# Patient Record
Sex: Male | Born: 1995
Health system: Southern US, Community
[De-identification: ages and names within clinical notes are randomized; demographics above are authoritative.]

## PROBLEM LIST (undated history)

## (undated) DIAGNOSIS — I639 Cerebral infarction, unspecified: Secondary | ICD-10-CM

## (undated) DIAGNOSIS — I10 Essential (primary) hypertension: Secondary | ICD-10-CM

## (undated) DIAGNOSIS — E119 Type 2 diabetes mellitus without complications: Secondary | ICD-10-CM

## (undated) HISTORY — PX: TRACHEOSTOMY: SUR1362

## (undated) HISTORY — PX: PEG TUBE PLACEMENT: SUR1034

## (undated) HISTORY — PX: IVC FILTER PLACEMENT (ARMC HX): HXRAD1551

## (undated) HISTORY — PX: LEG SURGERY: SHX1003

---
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
## 2015-07-03 ENCOUNTER — Encounter (HOSPITAL_COMMUNITY): Payer: Self-pay | Admitting: *Deleted

## 2015-07-03 DIAGNOSIS — R1111 Vomiting without nausea: Secondary | ICD-10-CM | POA: Diagnosis not present

## 2015-07-03 DIAGNOSIS — R197 Diarrhea, unspecified: Secondary | ICD-10-CM | POA: Insufficient documentation

## 2015-07-03 DIAGNOSIS — R509 Fever, unspecified: Secondary | ICD-10-CM | POA: Diagnosis present

## 2015-07-03 MED ORDER — ACETAMINOPHEN 500 MG PO TABS
1000.0000 mg | ORAL_TABLET | Freq: Once | ORAL | Status: AC
  Administered 2015-07-04: 1000 mg via ORAL

## 2015-07-03 MED ORDER — ACETAMINOPHEN 500 MG PO TABS
ORAL_TABLET | ORAL | Status: DC
Start: 2015-07-03 — End: 2015-07-04
  Filled 2015-07-03: qty 2

## 2015-07-03 NOTE — ED Notes (Signed)
Dad states fever for the past 3 days. Pt states been vomiting & diarrhea.

## 2015-07-04 ENCOUNTER — Emergency Department (HOSPITAL_COMMUNITY)
Admission: EM | Admit: 2015-07-04 | Discharge: 2015-07-04 | Disposition: A | Discharge status: Home / Self Care | Payer: BLUE CROSS/BLUE SHIELD | Attending: Emergency Medicine | Admitting: Emergency Medicine

## 2015-07-04 DIAGNOSIS — R112 Nausea with vomiting, unspecified: Secondary | ICD-10-CM

## 2015-07-04 DIAGNOSIS — R197 Diarrhea, unspecified: Secondary | ICD-10-CM

## 2015-07-04 LAB — COMPREHENSIVE METABOLIC PANEL
ALBUMIN: 3.5 g/dL (ref 3.5–5.0)
ALK PHOS: 54 U/L (ref 38–126)
ALT: 31 U/L (ref 17–63)
AST: 25 U/L (ref 15–41)
Anion gap: 11 (ref 5–15)
BILIRUBIN TOTAL: 1.3 mg/dL — AB (ref 0.3–1.2)
BUN: 12 mg/dL (ref 6–20)
CALCIUM: 8.7 mg/dL — AB (ref 8.9–10.3)
CO2: 21 mmol/L — AB (ref 22–32)
Chloride: 102 mmol/L (ref 101–111)
Creatinine, Ser: 1.31 mg/dL — ABNORMAL HIGH (ref 0.61–1.24)
GFR calc Af Amer: >60 mL/min (ref >60)
GFR calc non Af Amer: >60 mL/min (ref >60)
GLUCOSE: 116 mg/dL — AB (ref 65–99)
Potassium: 3.6 mmol/L (ref 3.5–5.1)
Sodium: 134 mmol/L — ABNORMAL LOW (ref 135–145)
TOTAL PROTEIN: 8.5 g/dL — AB (ref 6.5–8.1)

## 2015-07-04 LAB — URINE MICROSCOPIC-ADD ON

## 2015-07-04 LAB — CBC WITH DIFFERENTIAL/PLATELET
BASOS ABS: 0 10*3/uL (ref 0.0–0.1)
BASOS PCT: 0 %
Eosinophils Absolute: 0 10*3/uL (ref 0.0–0.7)
Eosinophils Relative: 0 %
HEMATOCRIT: 40.3 % (ref 39.0–52.0)
HEMOGLOBIN: 13.3 g/dL (ref 13.0–17.0)
Lymphocytes Relative: 17 %
Lymphs Abs: 2 10*3/uL (ref 0.7–4.0)
MCH: 27.7 pg (ref 26.0–34.0)
MCHC: 33 g/dL (ref 30.0–36.0)
MCV: 84 fL (ref 78.0–100.0)
MONOS PCT: 10 %
Monocytes Absolute: 1.2 10*3/uL — ABNORMAL HIGH (ref 0.1–1.0)
NEUTROS ABS: 8.5 10*3/uL — AB (ref 1.7–7.7)
NEUTROS PCT: 73 %
Platelets: 244 10*3/uL (ref 150–400)
RBC: 4.8 MIL/uL (ref 4.22–5.81)
RDW: 13.8 % (ref 11.5–15.5)
WBC: 11.7 10*3/uL — AB (ref 4.0–10.5)

## 2015-07-04 LAB — URINALYSIS, ROUTINE W REFLEX MICROSCOPIC
GLUCOSE, UA: NEGATIVE mg/dL
Hgb urine dipstick: NEGATIVE
Ketones, ur: 15 mg/dL — AB
Nitrite: NEGATIVE
PH: 6 (ref 5.0–8.0)
PROTEIN: 100 mg/dL — AB
SPECIFIC GRAVITY, URINE: 1 — AB (ref 1.005–1.030)

## 2015-07-04 LAB — LIPASE, BLOOD: Lipase: 20 U/L (ref 11–51)

## 2015-07-04 MED ORDER — SODIUM CHLORIDE 0.9 % IV BOLUS (SEPSIS)
1000.0000 mL | Freq: Once | INTRAVENOUS | Status: AC
  Administered 2015-07-04: 1000 mL via INTRAVENOUS

## 2015-07-04 MED ORDER — LOPERAMIDE HCL 2 MG PO CAPS: 2.0000 mg | ORAL_CAPSULE | Freq: Four times a day (QID) | ORAL | Status: DC | PRN

## 2015-07-04 MED ORDER — ONDANSETRON 4 MG PO TBDP: 4.0000 mg | ORAL_TABLET | Freq: Three times a day (TID) | ORAL | Status: DC | PRN

## 2015-07-04 MED ORDER — KETOROLAC TROMETHAMINE 30 MG/ML IJ SOLN
30.0000 mg | Freq: Once | INTRAMUSCULAR | Status: AC
  Administered 2015-07-04: 30 mg via INTRAVENOUS
  Filled 2015-07-04: qty 1

## 2015-07-04 MED ORDER — DICYCLOMINE HCL 10 MG/ML IM SOLN
20.0000 mg | Freq: Once | INTRAMUSCULAR | Status: AC
  Administered 2015-07-04: 20 mg via INTRAMUSCULAR
  Filled 2015-07-04: qty 2

## 2015-07-04 MED ORDER — LOPERAMIDE HCL 2 MG PO CAPS
4.0000 mg | ORAL_CAPSULE | Freq: Once | ORAL | Status: AC
  Administered 2015-07-04: 4 mg via ORAL
  Filled 2015-07-04: qty 2

## 2015-07-04 MED ORDER — ONDANSETRON HCL 4 MG/2ML IJ SOLN
4.0000 mg | Freq: Once | INTRAMUSCULAR | Status: AC
  Administered 2015-07-04: 4 mg via INTRAVENOUS
  Filled 2015-07-04: qty 2

## 2015-07-04 NOTE — Discharge Instructions (Signed)
You may alternate between Tylenol 1000 mg every 6 hours as needed for fever and pain and ibuprofen 800 mg every 8 hours as needed for fever and pain. Please drink plenty of fluids.   Viral Gastroenteritis Viral gastroenteritis is also known as stomach flu. This condition affects the stomach and intestinal tract. It can cause sudden diarrhea and vomiting. The illness typically lasts 3 to 8 days. Most people develop an immune response that eventually gets rid of the virus. While this natural response develops, the virus can make you quite ill. CAUSES  Many different viruses can cause gastroenteritis, such as rotavirus or noroviruses. You can catch one of these viruses by consuming contaminated food or water. You may also catch a virus by sharing utensils or other personal items with an infected person or by touching a contaminated surface. SYMPTOMS  The most common symptoms are diarrhea and vomiting. These problems can cause a severe loss of body fluids (dehydration) and a body salt (electrolyte) imbalance. Other symptoms may include:  Fever.  Headache.  Fatigue.  Abdominal pain. DIAGNOSIS  Your caregiver can usually diagnose viral gastroenteritis based on your symptoms and a physical exam. A stool sample may also be taken to test for the presence of viruses or other infections. TREATMENT  This illness typically goes away on its own. Treatments are aimed at rehydration. The most serious cases of viral gastroenteritis involve vomiting so severely that you are not able to keep fluids down. In these cases, fluids must be given through an intravenous line (IV). HOME CARE INSTRUCTIONS   Drink enough fluids to keep your urine clear or pale yellow. Drink small amounts of fluids frequently and increase the amounts as tolerated.  Ask your caregiver for specific rehydration instructions.  Avoid:  Foods high in sugar.  Alcohol.  Carbonated drinks.  Tobacco.  Juice.  Caffeine  drinks.  Extremely hot or cold fluids.  Fatty, greasy foods.  Too much intake of anything at one time.  Dairy products until 24 to 48 hours after diarrhea stops.  You may consume probiotics. Probiotics are active cultures of beneficial bacteria. They may lessen the amount and number of diarrheal stools in adults. Probiotics can be found in yogurt with active cultures and in supplements.  Wash your hands well to avoid spreading the virus.  Only take over-the-counter or prescription medicines for pain, discomfort, or fever as directed by your caregiver. Do not give aspirin to children. Antidiarrheal medicines are not recommended.  Ask your caregiver if you should continue to take your regular prescribed and over-the-counter medicines.  Keep all follow-up appointments as directed by your caregiver. SEEK IMMEDIATE MEDICAL CARE IF:   You are unable to keep fluids down.  You do not urinate at least once every 6 to 8 hours.  You develop shortness of breath.  You notice blood in your stool or vomit. This may look like coffee grounds.  You have abdominal pain that increases or is concentrated in one small area (localized).  You have persistent vomiting or diarrhea.  You have a fever.  The patient is a child younger than 3 months, and he or she has a fever.  The patient is a child older than 3 months, and he or she has a fever and persistent symptoms.  The patient is a child older than 3 months, and he or she has a fever and symptoms suddenly get worse.  The patient is a baby, and he or she has no tears when crying. MAKE  SURE YOU:   Understand these instructions.  Will watch your condition.  Will get help right away if you are not doing well or get worse.   This information is not intended to replace advice given to you by your health care provider. Make sure you discuss any questions you have with your health care provider.   Document Released: 03/21/2005 Document Revised:  06/13/2011 Document Reviewed: 01/05/2011 Elsevier Interactive Patient Education [employer]2016 [employer]

## 2015-07-04 NOTE — ED Notes (Signed)
Patient tolerated po fluids without any difficulty.

## 2015-07-04 NOTE — ED Provider Notes (Signed)
TIME SEEN: 1:10 AM  CHIEF COMPLAINT: Fever, vomiting, diarrhea, abdominal pain  HPI: Pt is a 20 y.o. male who is morbidly obese who presents emergency department with 2-3 days of fevers, vomiting, diarrhea and diffuse crampy abdominal pain. Denies any recent sick contacts, bad food exposure, travel, hospitalization or antibiotic use. No history of abdominal surgery. No dysuria, hematuria, penile discharge, testicular pain or swelling. Last vomited yesterday morning. Had diarrhea prior to arrival. Reports 4-5 episodes today of nonbloody diarrhea.  ROS: See HPI Constitutional:  fever  Eyes: no drainage  ENT: no runny nose   Cardiovascular:  no chest pain  Resp: no SOB  GI:  vomiting GU: no dysuria Integumentary: no rash  Allergy: no hives  Musculoskeletal: no leg swelling  Neurological: no slurred speech ROS otherwise negative  PAST MEDICAL HISTORY/PAST SURGICAL HISTORY:  History reviewed. No pertinent past medical history.  MEDICATIONS:  Prior to Admission medications   Medication Sig Start Date End Date Taking? Authorizing Provider  acetaminophen-codeine (TYLENOL #3) 300-30 MG per tablet Take 1-2 tablets by mouth every 6 (six) hours as needed for pain. 11/26/12   Ivery QualeHobson Bryant, PA-C  aspirin-acetaminophen-caffeine (EXCEDRIN MIGRAINE) [phone] 0950250-250-65 MG per tablet Take 1 tablet by mouth daily as needed for pain.    Historical Provider, MD  ibuprofen (ADVIL,MOTRIN) 200 MG tablet Take 400 mg by mouth daily as needed for pain.    Historical Provider, MD  meloxicam (MOBIC) 7.5 MG tablet 1 po bid with food for pain 11/26/12   Ivery QualeHobson Bryant, PA-C  ondansetron (ZOFRAN) 4 MG tablet 1 po q6h prn nausea. 11/26/12   Ivery QualeHobson Bryant, PA-C    ALLERGIES:  No Known Allergies  SOCIAL HISTORY:  Social History  Substance Use Topics  . Smoking status: Never Smoker   . Smokeless tobacco: Not on file  . Alcohol Use: No    FAMILY HISTORY: No family history on file.  EXAM: BP 110/69 mmHg  Pulse 125   Temp(Src) 99.4 F (37.4 C) (Oral)  Resp 18  Ht 5\' 7"  (1.702 m)  Wt 411 lb (186.428 kg)  BMI 64.36 kg/m2  SpO2 98% CONSTITUTIONAL: Alert and oriented and responds appropriately to questions. Febrile appears uncomfortable but is nontoxic appearing, morbidly obese HEAD: Normocephalic EYES: Conjunctivae clear, PERRL ENT: normal nose; no rhinorrhea; dry mucous membranes NECK: Supple, no meningismus, no LAD  CARD: Regular and tachycardic; S1 and S2 appreciated; no murmurs, no clicks, no rubs, no gallops RESP: Normal chest excursion without splinting or tachypnea; breath sounds clear and equal bilaterally; no wheezes, no rhonchi, no rales, no hypoxia or respiratory distress, speaking full sentences ABD/GI: Normal bowel sounds; non-distended; soft, diffusely tender throughout his abdomen without focality, no rebound, no guarding, no peritoneal signs BACK:  The back appears normal and is non-tender to palpation, there is no CVA tenderness EXT: Normal ROM in all joints; non-tender to palpation; no edema; normal capillary refill; no cyanosis, no calf tenderness or swelling    SKIN: Normal color for age and race; warm; no rash NEURO: Moves all extremities equally, sensation to light touch intact diffusely, cranial nerves II through XII intact PSYCH: The patient's mood and manner are appropriate. Grooming and personal hygiene are appropriate.  MEDICAL DECISION MAKING: Patient here with what appears to be viral gastroenteritis. He is febrile and complains of diffuse abdominal cramping but has a relatively benign abdominal exam. Not specifically tender at McBurney's point. Negative Murphy sign. At this time I do not feel he needs emergent imaging of his abdomen. Doubt  diverticulitis, appendicitis, cholecystitis, pancreatitis, bowel obstruction. We'll obtain labs and urine. Will treat symptomatically with IV fluids, Toradol, Bentyl, Zofran, Imodium.  ED PROGRESS: 4:30 AM  Pt reports feeling much better. His  vital signs have also improved. Abdominal exam is completely benign. Able to drink without difficulty. Labs show mild leukocytosis with left shift, mild elevation of his creatinine. Urine shows trace leukocytes but also many bacteria and many squamous cells. Suspect dirty catch. He is not having urinary symptoms. He has small ketones after 2 L of IV fluids. I feel he is safe to be discharged home. We'll discharge with Zofran, Imodium, Bentyl. Have discussed with patient and father at bedside return precautions. I feel he is safe for discharge home. Again suspect viral illness.    At this time, I do not feel there is any life-threatening condition present. I have reviewed and discussed all results (EKG, imaging, lab, urine as appropriate), exam findings with patient. I have reviewed nursing notes and appropriate previous records.  I feel the patient is safe to be discharged home without further emergent workup. Discussed usual and customary return precautions. Patient and family (if present) verbalize understanding and are comfortable with this plan.  Patient will follow-up with their primary care provider. If they do not have a primary care provider, information for follow-up has been provided to them. All questions have been answered.      Layla Maw Ward, DO 07/04/15 [phone]

## 2015-07-04 NOTE — ED Notes (Signed)
Pt alert & oriented x4, stable gait. Patient given discharge instructions, paperwork & prescription(s). Patient  instructed to stop at the registration desk to finish any additional paperwork. Patient verbalized understanding. Pt left department w/ no further questions. 

## 2015-07-30 ENCOUNTER — Encounter (INDEPENDENT_AMBULATORY_CARE_PROVIDER_SITE_OTHER): Payer: Self-pay | Admitting: *Deleted

## 2015-08-14 ENCOUNTER — Ambulatory Visit (INDEPENDENT_AMBULATORY_CARE_PROVIDER_SITE_OTHER): Payer: 59 | Admitting: Internal Medicine

## 2018-02-21 DIAGNOSIS — R0602 Shortness of breath: Secondary | ICD-10-CM | POA: Diagnosis not present

## 2018-02-21 DIAGNOSIS — R5383 Other fatigue: Secondary | ICD-10-CM | POA: Diagnosis not present

## 2018-02-21 DIAGNOSIS — R0683 Snoring: Secondary | ICD-10-CM | POA: Diagnosis not present

## 2018-02-21 DIAGNOSIS — J0100 Acute maxillary sinusitis, unspecified: Secondary | ICD-10-CM | POA: Diagnosis not present

## 2018-02-21 DIAGNOSIS — J343 Hypertrophy of nasal turbinates: Secondary | ICD-10-CM | POA: Diagnosis not present

## 2018-02-21 DIAGNOSIS — R070 Pain in throat: Secondary | ICD-10-CM | POA: Diagnosis not present

## 2018-02-21 DIAGNOSIS — R0789 Other chest pain: Secondary | ICD-10-CM | POA: Diagnosis not present

## 2018-02-21 DIAGNOSIS — Z6841 Body Mass Index (BMI) 40.0 and over, adult: Secondary | ICD-10-CM | POA: Diagnosis not present

## 2018-02-21 DIAGNOSIS — G4733 Obstructive sleep apnea (adult) (pediatric): Secondary | ICD-10-CM | POA: Diagnosis not present

## 2018-03-21 DIAGNOSIS — J343 Hypertrophy of nasal turbinates: Secondary | ICD-10-CM | POA: Diagnosis not present

## 2018-04-09 DIAGNOSIS — G4733 Obstructive sleep apnea (adult) (pediatric): Secondary | ICD-10-CM | POA: Diagnosis not present

## 2018-04-13 DIAGNOSIS — G4733 Obstructive sleep apnea (adult) (pediatric): Secondary | ICD-10-CM | POA: Diagnosis not present

## 2018-10-29 DIAGNOSIS — Z0001 Encounter for general adult medical examination with abnormal findings: Secondary | ICD-10-CM | POA: Diagnosis not present

## 2018-10-29 DIAGNOSIS — Z1389 Encounter for screening for other disorder: Secondary | ICD-10-CM | POA: Diagnosis not present

## 2018-10-29 DIAGNOSIS — R7309 Other abnormal glucose: Secondary | ICD-10-CM | POA: Diagnosis not present

## 2018-10-29 DIAGNOSIS — Z23 Encounter for immunization: Secondary | ICD-10-CM | POA: Diagnosis not present

## 2018-10-29 DIAGNOSIS — Z0000 Encounter for general adult medical examination without abnormal findings: Secondary | ICD-10-CM | POA: Diagnosis not present

## 2018-10-29 DIAGNOSIS — E1165 Type 2 diabetes mellitus with hyperglycemia: Secondary | ICD-10-CM | POA: Diagnosis not present

## 2018-10-29 DIAGNOSIS — Z6841 Body Mass Index (BMI) 40.0 and over, adult: Secondary | ICD-10-CM | POA: Diagnosis not present

## 2018-10-30 DIAGNOSIS — Z23 Encounter for immunization: Secondary | ICD-10-CM | POA: Diagnosis not present

## 2018-10-30 DIAGNOSIS — Z0001 Encounter for general adult medical examination with abnormal findings: Secondary | ICD-10-CM | POA: Diagnosis not present

## 2018-10-30 DIAGNOSIS — Z1389 Encounter for screening for other disorder: Secondary | ICD-10-CM | POA: Diagnosis not present

## 2018-10-30 DIAGNOSIS — E119 Type 2 diabetes mellitus without complications: Secondary | ICD-10-CM | POA: Diagnosis not present

## 2018-10-30 DIAGNOSIS — E1165 Type 2 diabetes mellitus with hyperglycemia: Secondary | ICD-10-CM | POA: Diagnosis not present

## 2018-12-13 DIAGNOSIS — Z6841 Body Mass Index (BMI) 40.0 and over, adult: Secondary | ICD-10-CM | POA: Diagnosis not present

## 2018-12-13 DIAGNOSIS — L989 Disorder of the skin and subcutaneous tissue, unspecified: Secondary | ICD-10-CM | POA: Diagnosis not present

## 2018-12-13 DIAGNOSIS — E1165 Type 2 diabetes mellitus with hyperglycemia: Secondary | ICD-10-CM | POA: Diagnosis not present

## 2018-12-25 DIAGNOSIS — D2339 Other benign neoplasm of skin of other parts of face: Secondary | ICD-10-CM | POA: Diagnosis not present

## 2019-01-14 DIAGNOSIS — R05 Cough: Secondary | ICD-10-CM | POA: Diagnosis not present

## 2019-01-14 DIAGNOSIS — R509 Fever, unspecified: Secondary | ICD-10-CM | POA: Diagnosis not present

## 2019-01-14 DIAGNOSIS — J208 Acute bronchitis due to other specified organisms: Secondary | ICD-10-CM | POA: Diagnosis not present

## 2019-01-14 DIAGNOSIS — R0602 Shortness of breath: Secondary | ICD-10-CM | POA: Diagnosis not present

## 2019-01-16 DIAGNOSIS — R509 Fever, unspecified: Secondary | ICD-10-CM | POA: Diagnosis not present

## 2019-01-16 DIAGNOSIS — U071 COVID-19: Secondary | ICD-10-CM | POA: Diagnosis not present

## 2019-01-16 DIAGNOSIS — E871 Hypo-osmolality and hyponatremia: Secondary | ICD-10-CM | POA: Diagnosis not present

## 2019-01-16 DIAGNOSIS — E119 Type 2 diabetes mellitus without complications: Secondary | ICD-10-CM | POA: Diagnosis not present

## 2019-01-16 DIAGNOSIS — R0902 Hypoxemia: Secondary | ICD-10-CM | POA: Diagnosis not present

## 2019-01-16 DIAGNOSIS — J9601 Acute respiratory failure with hypoxia: Secondary | ICD-10-CM | POA: Diagnosis not present

## 2019-01-16 DIAGNOSIS — Z20828 Contact with and (suspected) exposure to other viral communicable diseases: Secondary | ICD-10-CM | POA: Diagnosis not present

## 2019-01-16 DIAGNOSIS — R0602 Shortness of breath: Secondary | ICD-10-CM | POA: Diagnosis not present

## 2019-01-16 DIAGNOSIS — E139 Other specified diabetes mellitus without complications: Secondary | ICD-10-CM | POA: Diagnosis not present

## 2019-01-16 DIAGNOSIS — Z6841 Body Mass Index (BMI) 40.0 and over, adult: Secondary | ICD-10-CM | POA: Diagnosis not present

## 2019-01-16 DIAGNOSIS — I10 Essential (primary) hypertension: Secondary | ICD-10-CM | POA: Diagnosis not present

## 2019-01-16 DIAGNOSIS — Z794 Long term (current) use of insulin: Secondary | ICD-10-CM | POA: Diagnosis not present

## 2019-01-16 DIAGNOSIS — J1289 Other viral pneumonia: Secondary | ICD-10-CM | POA: Diagnosis not present

## 2019-01-16 NOTE — Progress Notes (Deleted)
Name: Isreal Moline  MRN/ DOB: [id number], 11-02-1995   Age/ Sex: 23 y.o., male    PCP: Elfredia Nevins, MD   Reason for Endocrinology Evaluation: Type 2 Diabetes Mellitus     Date of Initial Endocrinology Visit: 01/16/2019     PATIENT IDENTIFIER: Mr. Paul Hall is a 23 y.o. male with a past medical history of T2DM. The patient presented for initial endocrinology clinic visit on 01/16/2019 for consultative assistance with his diabetes management.    HPI: Mr. Vandyne was    Diagnosed with DM *** Prior Medications tried/Intolerance: *** Currently checking blood sugars *** x / day,  before breakfast and ***.  Hypoglycemia episodes : ***               Symptoms: ***                 Frequency: ***/  Hemoglobin A1c has ranged from *** in ***, peaking at *** in ***. Patient required assistance for hypoglycemia:  Patient has required hospitalization within the last 1 year from hyper or hypoglycemia:   In terms of diet, the patient ***   HOME DIABETES REGIMEN: Glyburide- Metformin 2.5-500 mg BID    Statin: {Yes/No:11203} ACE-I/ARB: {YES/NO:17245} Prior Diabetic Education: {Yes/No:11203}   METER DOWNLOAD SUMMARY: Date range evaluated: *** Fingerstick Blood Glucose Tests = *** Average Number Tests/Day = *** Overall Mean FS Glucose = *** Standard Deviation = ***  BG Ranges: Low = *** High = ***   Hypoglycemic Events/30 Days: BG < 50 = *** Episodes of symptomatic severe hypoglycemia = ***   DIABETIC COMPLICATIONS: Microvascular complications:   ***  Denies: ***  Last eye exam: Completed   Macrovascular complications:   ***  Denies: CAD, PVD, CVA   PAST HISTORY:  Past Medical History: No past medical history on file. Past Surgical History:  Past Surgical History:  Procedure Laterality Date  . LEG SURGERY        Social History:  reports that he has never smoked. He does not have any smokeless tobacco history on file. He reports that he does not  drink alcohol or use drugs.  Family History: No family history on file.   HOME MEDICATIONS: Allergies as of 01/17/2019   No Known Allergies     Medication List       Accurate as of January 16, 2019  1:23 PM. If you have any questions, ask your nurse or doctor.        loperamide 2 MG capsule Commonly known as: IMODIUM Take 1 capsule (2 mg total) by mouth 4 (four) times daily as needed for diarrhea or loose stools.   ondansetron 4 MG disintegrating tablet Commonly known as: Zofran ODT Take 1 tablet (4 mg total) by mouth every 8 (eight) hours as needed for nausea or vomiting.        ALLERGIES: No Known Allergies   REVIEW OF SYSTEMS: A comprehensive ROS was conducted with the patient and is negative except as per HPI and below:  ROS    OBJECTIVE:   VITAL SIGNS: There were no vitals taken for this visit.   PHYSICAL EXAM:  General: Pt appears well and is in NAD  Hydration: Well-hydrated with moist mucous membranes and good skin turgor  HEENT: Head: Unremarkable with good dentition. Oropharynx clear without exudate.  Eyes: External eye exam normal without stare, lid lag or exophthalmos.  EOM intact.  PERRL.  Neck: General: Supple without adenopathy or carotid bruits. Thyroid: Thyroid size normal.  No  goiter or nodules appreciated. No thyroid bruit.  Lungs: Clear with good BS bilat with no rales, rhonchi, or wheezes  Heart: RRR with normal S1 and S2 and no gallops; no murmurs; no rub  Abdomen: Normoactive bowel sounds, soft, nontender, without masses or organomegaly palpable  Extremities:  Lower extremities - No pretibial edema. No lesions.  Skin: Normal texture and temperature to palpation. No rash noted. No Acanthosis nigricans/skin tags. No lipohypertrophy.  Neuro: MS is good with appropriate affect, pt is alert and Ox3    DM foot exam:    DATA REVIEWED: 10/29/2018 Total Cholesterol 167 LDL 103 TG 112   ASSESSMENT / PLAN / RECOMMENDATIONS:   1) Type ***  Diabetes Mellitus, ***controlled, With*** complications - Most recent A1c of *** %. Goal A1c < *** %.  ***  Plan: GENERAL:  ***  MEDICATIONS:  ***  EDUCATION / INSTRUCTIONS:  BG monitoring instructions: Patient is instructed to check his blood sugars *** times a day, ***.  Call [facility] if: BG persistently < 70 or > 300. . I reviewed the Rule of 15 for the treatment of hypoglycemia in detail with the patient. Literature supplied.   2) Diabetic complications:   Eye: Does *** have known diabetic retinopathy.   Neuro/ Feet: Does *** have known diabetic peripheral neuropathy.  Renal: Patient does *** have known baseline CKD. He is *** on an ACEI/ARB at present.Check urine albumin/creatinine ratio yearly starting at time of diagnosis. If albuminuria is positive, treatment is geared toward better glucose, blood pressure control and use of ACE inhibitors or ARBs. Monitor electrolytes and creatinine once to twice yearly.   3) Lipids: Patient is *** on a statin.    4) Hypertension: ***  at goal of < 140/90 mmHg.       Signed electronically by: Mack Guise, MD  [facility] Endocrinology  [facility] [address] [place], [place] [address] Phone: [phone] FAX: [phone]   CC: [address], [place] [address] Phone: [phone]  Fax: [phone]    Return to Endocrinology clinic as below: Future Appointments  Date Time Provider [place]  01/17/2019  8:30 AM Jabe Jeanbaptiste, Melanie Crazier, MD [facility] None

## 2019-01-17 ENCOUNTER — Ambulatory Visit: Payer: 59 | Admitting: Internal Medicine

## 2019-01-20 DIAGNOSIS — Z9911 Dependence on respirator [ventilator] status: Secondary | ICD-10-CM | POA: Diagnosis not present

## 2019-01-20 DIAGNOSIS — R0602 Shortness of breath: Secondary | ICD-10-CM | POA: Diagnosis not present

## 2019-01-20 DIAGNOSIS — A4189 Other specified sepsis: Secondary | ICD-10-CM | POA: Diagnosis not present

## 2019-01-20 DIAGNOSIS — I639 Cerebral infarction, unspecified: Secondary | ICD-10-CM | POA: Diagnosis not present

## 2019-01-20 DIAGNOSIS — I6389 Other cerebral infarction: Secondary | ICD-10-CM | POA: Diagnosis not present

## 2019-01-20 DIAGNOSIS — I10 Essential (primary) hypertension: Secondary | ICD-10-CM | POA: Diagnosis not present

## 2019-01-20 DIAGNOSIS — R918 Other nonspecific abnormal finding of lung field: Secondary | ICD-10-CM | POA: Diagnosis not present

## 2019-01-20 DIAGNOSIS — N179 Acute kidney failure, unspecified: Secondary | ICD-10-CM | POA: Diagnosis not present

## 2019-01-20 DIAGNOSIS — Z6841 Body Mass Index (BMI) 40.0 and over, adult: Secondary | ICD-10-CM | POA: Diagnosis not present

## 2019-01-20 DIAGNOSIS — J80 Acute respiratory distress syndrome: Secondary | ICD-10-CM | POA: Diagnosis not present

## 2019-01-20 DIAGNOSIS — I517 Cardiomegaly: Secondary | ICD-10-CM | POA: Diagnosis not present

## 2019-01-20 DIAGNOSIS — R509 Fever, unspecified: Secondary | ICD-10-CM | POA: Diagnosis not present

## 2019-01-20 DIAGNOSIS — I619 Nontraumatic intracerebral hemorrhage, unspecified: Secondary | ICD-10-CM | POA: Diagnosis not present

## 2019-01-20 DIAGNOSIS — R160 Hepatomegaly, not elsewhere classified: Secondary | ICD-10-CM | POA: Diagnosis not present

## 2019-01-20 DIAGNOSIS — I82403 Acute embolism and thrombosis of unspecified deep veins of lower extremity, bilateral: Secondary | ICD-10-CM | POA: Diagnosis not present

## 2019-01-20 DIAGNOSIS — G9340 Encephalopathy, unspecified: Secondary | ICD-10-CM | POA: Diagnosis not present

## 2019-01-20 DIAGNOSIS — E119 Type 2 diabetes mellitus without complications: Secondary | ICD-10-CM | POA: Diagnosis not present

## 2019-01-20 DIAGNOSIS — R578 Other shock: Secondary | ICD-10-CM | POA: Diagnosis not present

## 2019-01-20 DIAGNOSIS — R404 Transient alteration of awareness: Secondary | ICD-10-CM | POA: Diagnosis not present

## 2019-01-20 DIAGNOSIS — R9401 Abnormal electroencephalogram [EEG]: Secondary | ICD-10-CM | POA: Diagnosis not present

## 2019-01-20 DIAGNOSIS — Z452 Encounter for adjustment and management of vascular access device: Secondary | ICD-10-CM | POA: Diagnosis not present

## 2019-01-20 DIAGNOSIS — E870 Hyperosmolality and hypernatremia: Secondary | ICD-10-CM | POA: Diagnosis not present

## 2019-01-20 DIAGNOSIS — E875 Hyperkalemia: Secondary | ICD-10-CM | POA: Diagnosis not present

## 2019-01-20 DIAGNOSIS — D696 Thrombocytopenia, unspecified: Secondary | ICD-10-CM | POA: Diagnosis not present

## 2019-01-20 DIAGNOSIS — I611 Nontraumatic intracerebral hemorrhage in hemisphere, cortical: Secondary | ICD-10-CM | POA: Diagnosis not present

## 2019-01-20 DIAGNOSIS — I499 Cardiac arrhythmia, unspecified: Secondary | ICD-10-CM | POA: Diagnosis not present

## 2019-01-20 DIAGNOSIS — I63512 Cerebral infarction due to unspecified occlusion or stenosis of left middle cerebral artery: Secondary | ICD-10-CM | POA: Diagnosis not present

## 2019-01-20 DIAGNOSIS — I82413 Acute embolism and thrombosis of femoral vein, bilateral: Secondary | ICD-10-CM | POA: Diagnosis not present

## 2019-01-20 DIAGNOSIS — J9602 Acute respiratory failure with hypercapnia: Secondary | ICD-10-CM | POA: Diagnosis not present

## 2019-01-20 DIAGNOSIS — N170 Acute kidney failure with tubular necrosis: Secondary | ICD-10-CM | POA: Diagnosis not present

## 2019-01-20 DIAGNOSIS — R532 Functional quadriplegia: Secondary | ICD-10-CM | POA: Diagnosis not present

## 2019-01-20 DIAGNOSIS — R140 Abdominal distension (gaseous): Secondary | ICD-10-CM | POA: Diagnosis not present

## 2019-01-20 DIAGNOSIS — E1165 Type 2 diabetes mellitus with hyperglycemia: Secondary | ICD-10-CM | POA: Diagnosis not present

## 2019-01-20 DIAGNOSIS — Z794 Long term (current) use of insulin: Secondary | ICD-10-CM | POA: Diagnosis not present

## 2019-01-20 DIAGNOSIS — J189 Pneumonia, unspecified organism: Secondary | ICD-10-CM | POA: Diagnosis not present

## 2019-01-20 DIAGNOSIS — R579 Shock, unspecified: Secondary | ICD-10-CM | POA: Diagnosis not present

## 2019-01-20 DIAGNOSIS — U071 COVID-19: Secondary | ICD-10-CM | POA: Diagnosis not present

## 2019-01-20 DIAGNOSIS — I6201 Nontraumatic acute subdural hemorrhage: Secondary | ICD-10-CM | POA: Diagnosis not present

## 2019-01-20 DIAGNOSIS — R52 Pain, unspecified: Secondary | ICD-10-CM | POA: Diagnosis not present

## 2019-01-20 DIAGNOSIS — E669 Obesity, unspecified: Secondary | ICD-10-CM | POA: Diagnosis not present

## 2019-01-20 DIAGNOSIS — J1289 Other viral pneumonia: Secondary | ICD-10-CM | POA: Diagnosis not present

## 2019-01-20 DIAGNOSIS — R571 Hypovolemic shock: Secondary | ICD-10-CM | POA: Diagnosis not present

## 2019-01-20 DIAGNOSIS — Z4682 Encounter for fitting and adjustment of non-vascular catheter: Secondary | ICD-10-CM | POA: Diagnosis not present

## 2019-01-20 DIAGNOSIS — J811 Chronic pulmonary edema: Secondary | ICD-10-CM | POA: Diagnosis not present

## 2019-01-20 DIAGNOSIS — R7401 Elevation of levels of liver transaminase levels: Secondary | ICD-10-CM | POA: Diagnosis not present

## 2019-01-20 DIAGNOSIS — R0902 Hypoxemia: Secondary | ICD-10-CM | POA: Diagnosis not present

## 2019-01-20 DIAGNOSIS — S36899A Unspecified injury of other intra-abdominal organs, initial encounter: Secondary | ICD-10-CM | POA: Diagnosis not present

## 2019-01-20 DIAGNOSIS — D689 Coagulation defect, unspecified: Secondary | ICD-10-CM | POA: Diagnosis not present

## 2019-01-20 DIAGNOSIS — G935 Compression of brain: Secondary | ICD-10-CM | POA: Diagnosis not present

## 2019-01-20 DIAGNOSIS — J9600 Acute respiratory failure, unspecified whether with hypoxia or hypercapnia: Secondary | ICD-10-CM | POA: Diagnosis not present

## 2019-01-20 DIAGNOSIS — I824Y3 Acute embolism and thrombosis of unspecified deep veins of proximal lower extremity, bilateral: Secondary | ICD-10-CM | POA: Diagnosis not present

## 2019-01-20 DIAGNOSIS — E109 Type 1 diabetes mellitus without complications: Secondary | ICD-10-CM | POA: Diagnosis not present

## 2019-01-20 DIAGNOSIS — Z930 Tracheostomy status: Secondary | ICD-10-CM | POA: Diagnosis not present

## 2019-01-20 DIAGNOSIS — R6521 Severe sepsis with septic shock: Secondary | ICD-10-CM | POA: Diagnosis not present

## 2019-01-20 DIAGNOSIS — K6389 Other specified diseases of intestine: Secondary | ICD-10-CM | POA: Diagnosis not present

## 2019-01-20 DIAGNOSIS — J9690 Respiratory failure, unspecified, unspecified whether with hypoxia or hypercapnia: Secondary | ICD-10-CM | POA: Diagnosis not present

## 2019-01-20 DIAGNOSIS — R197 Diarrhea, unspecified: Secondary | ICD-10-CM | POA: Diagnosis not present

## 2019-01-20 DIAGNOSIS — J90 Pleural effusion, not elsewhere classified: Secondary | ICD-10-CM | POA: Diagnosis not present

## 2019-01-20 DIAGNOSIS — J9601 Acute respiratory failure with hypoxia: Secondary | ICD-10-CM | POA: Diagnosis not present

## 2019-01-20 DIAGNOSIS — Z86718 Personal history of other venous thrombosis and embolism: Secondary | ICD-10-CM | POA: Diagnosis not present

## 2019-01-20 DIAGNOSIS — G4733 Obstructive sleep apnea (adult) (pediatric): Secondary | ICD-10-CM | POA: Diagnosis not present

## 2019-01-20 DIAGNOSIS — J9811 Atelectasis: Secondary | ICD-10-CM | POA: Diagnosis not present

## 2019-01-20 DIAGNOSIS — K567 Ileus, unspecified: Secondary | ICD-10-CM | POA: Diagnosis not present

## 2019-01-20 DIAGNOSIS — E8770 Fluid overload, unspecified: Secondary | ICD-10-CM | POA: Diagnosis not present

## 2019-01-20 DIAGNOSIS — D6859 Other primary thrombophilia: Secondary | ICD-10-CM | POA: Diagnosis not present

## 2019-01-20 DIAGNOSIS — G92 Toxic encephalopathy: Secondary | ICD-10-CM | POA: Diagnosis not present

## 2019-01-20 DIAGNOSIS — R569 Unspecified convulsions: Secondary | ICD-10-CM | POA: Diagnosis not present

## 2019-01-20 DIAGNOSIS — R633 Feeding difficulties: Secondary | ICD-10-CM | POA: Diagnosis not present

## 2019-01-20 DIAGNOSIS — R4182 Altered mental status, unspecified: Secondary | ICD-10-CM | POA: Diagnosis not present

## 2019-01-20 DIAGNOSIS — I6200 Nontraumatic subdural hemorrhage, unspecified: Secondary | ICD-10-CM | POA: Diagnosis not present

## 2019-01-20 DIAGNOSIS — R000 Tachycardia, unspecified: Secondary | ICD-10-CM | POA: Diagnosis not present

## 2019-02-15 DIAGNOSIS — G9340 Encephalopathy, unspecified: Secondary | ICD-10-CM | POA: Diagnosis not present

## 2019-02-15 DIAGNOSIS — U071 COVID-19: Secondary | ICD-10-CM | POA: Diagnosis not present

## 2019-02-15 DIAGNOSIS — R569 Unspecified convulsions: Secondary | ICD-10-CM | POA: Diagnosis not present

## 2019-02-18 ENCOUNTER — Encounter: Payer: 59 | Admitting: Internal Medicine

## 2019-02-18 NOTE — Progress Notes (Deleted)
Name: Paul Hall  MRN/ DOB: [id number], 06-15-1995   Age/ Sex: 23 y.o., male    PCP: [place] [address], MD   Reason for Endocrinology Evaluation: Type {NUMBERS 1 OR 2:522190} Diabetes Mellitus     Date of Initial Endocrinology Visit: 02/18/2019     PATIENT IDENTIFIER: Paul Hall is a 23 y.o. male with a past medical history of ***. The patient presented for initial endocrinology clinic visit on 02/18/2019 for consultative assistance with his diabetes management.    HPI: Paul Hall was    Diagnosed with DM *** Prior Medications tried/Intolerance: *** Currently checking blood sugars *** x / day,  before breakfast and ***.  Hypoglycemia episodes : ***               Symptoms: ***                 Frequency: ***/  Hemoglobin A1c has ranged from *** in ***, peaking at *** in ***. Patient required assistance for hypoglycemia:  Patient has required hospitalization within the last 1 year from hyper or hypoglycemia:   In terms of diet, the patient ***   HOME DIABETES REGIMEN: Basal: ***  Bolus: ***   Statin: {Yes/No:11203} ACE-I/ARB: {YES/NO:17245} Prior Diabetic Education: {Yes/No:11203}   METER DOWNLOAD SUMMARY: Date range evaluated: *** Fingerstick Blood Glucose Tests = *** Average Number Tests/Day = *** Overall Mean FS Glucose = *** Standard Deviation = ***  BG Ranges: Low = *** High = ***   Hypoglycemic Events/30 Days: BG < 50 = *** Episodes of symptomatic severe hypoglycemia = ***   DIABETIC COMPLICATIONS: Microvascular complications:   ***  Denies: ***  Last eye exam: Completed   Macrovascular complications:   ***  Denies: CAD, PVD, CVA   PAST HISTORY:  Past Medical History: No past medical history on file. Past Surgical History:  Past Surgical History:  Procedure Laterality Date  . LEG SURGERY        Social History:  reports that he has never smoked. He does not have any smokeless tobacco history on file. He reports that he  does not drink alcohol or use drugs.  Family History: No family history on file.   HOME MEDICATIONS: Allergies as of 02/18/2019   No Known Allergies     Medication List       Accurate as of February 18, 2019  7:26 AM. If you have any questions, ask your nurse or doctor.        loperamide 2 MG capsule Commonly known as: IMODIUM Take 1 capsule (2 mg total) by mouth 4 (four) times daily as needed for diarrhea or loose stools.   ondansetron 4 MG disintegrating tablet Commonly known as: Zofran ODT Take 1 tablet (4 mg total) by mouth every 8 (eight) hours as needed for nausea or vomiting.        ALLERGIES: No Known Allergies   REVIEW OF SYSTEMS: A comprehensive ROS was conducted with the patient and is negative except as per HPI and below:  ROS    OBJECTIVE:   VITAL SIGNS: There were no vitals taken for this visit.   PHYSICAL EXAM:  General: Pt appears well and is in NAD  Hydration: Well-hydrated with moist mucous membranes and good skin turgor  HEENT: Head: Unremarkable with good dentition. Oropharynx clear without exudate.  Eyes: External eye exam normal without stare, lid lag or exophthalmos.  EOM intact.  PERRL.  Neck: General: Supple without adenopathy or carotid bruits. Thyroid: Thyroid size normal.  No goiter or nodules appreciated. No thyroid bruit.  Lungs: Clear with good BS bilat with no rales, rhonchi, or wheezes  Heart: RRR with normal S1 and S2 and no gallops; no murmurs; no rub  Abdomen: Normoactive bowel sounds, soft, nontender, without masses or organomegaly palpable  Extremities:  Lower extremities - No pretibial edema. No lesions.  Skin: Normal texture and temperature to palpation. No rash noted. No Acanthosis nigricans/skin tags. No lipohypertrophy.  Neuro: MS is good with appropriate affect, pt is alert and Ox3    DM foot exam:    DATA REVIEWED:  No results found for: HGBA1C Lab Results  Component Value Date   CREATININE 1.31 (H)  07/04/2015   No results found for: MICRALBCREAT  No results found for: CHOL, HDL, LDLCALC, LDLDIRECT, TRIG, CHOLHDL      ASSESSMENT / PLAN / RECOMMENDATIONS:   1) Type *** Diabetes Mellitus, ***controlled, With*** complications - Most recent A1c of *** %. Goal A1c < *** %.  ***  Plan: GENERAL:  ***  MEDICATIONS:  ***  EDUCATION / INSTRUCTIONS:  BG monitoring instructions: Patient is instructed to check his blood sugars *** times a day, ***.  Call [facility] [facility] if: BG persistently < 70 or > 300. . I reviewed the Rule of 15 for the treatment of hypoglycemia in detail with the patient. Literature supplied.   2) Diabetic complications:   Eye: Does *** have known diabetic retinopathy.   Neuro/ Feet: Does *** have known diabetic peripheral neuropathy.  Renal: Patient does *** have known baseline CKD. He is *** on an ACEI/ARB at present.Check urine albumin/creatinine ratio yearly starting at time of diagnosis. If albuminuria is positive, treatment is geared toward better glucose, blood pressure control and use of ACE inhibitors or ARBs. Monitor electrolytes and creatinine once to twice yearly.   3) Lipids: Patient is *** on a statin.    4) Hypertension: ***  at goal of < 140/90 mmHg.       Signed electronically by: Lyndle Herrlich, MD  [facility] Endocrinology  [facility] [address] [place], [place] [address] Phone: [phone] FAX: [phone]   CC: Elfredia Nevins, MD [address] [place] [address] Phone: [phone]  Fax: [phone]    Return to Endocrinology clinic as below: Future Appointments  Date Time Provider Department Center  02/18/2019  8:10 AM Lucinda Spells, Konrad Dolores, MD [facility] None

## 2019-02-28 DIAGNOSIS — R9401 Abnormal electroencephalogram [EEG]: Secondary | ICD-10-CM | POA: Diagnosis not present

## 2019-02-28 DIAGNOSIS — R569 Unspecified convulsions: Secondary | ICD-10-CM | POA: Diagnosis not present

## 2019-03-01 DIAGNOSIS — R569 Unspecified convulsions: Secondary | ICD-10-CM | POA: Diagnosis not present

## 2019-03-01 DIAGNOSIS — R9401 Abnormal electroencephalogram [EEG]: Secondary | ICD-10-CM | POA: Diagnosis not present

## 2019-03-15 DIAGNOSIS — G8250 Quadriplegia, unspecified: Secondary | ICD-10-CM | POA: Diagnosis not present

## 2019-03-15 DIAGNOSIS — R130 Aphagia: Secondary | ICD-10-CM | POA: Diagnosis not present

## 2019-03-15 DIAGNOSIS — Z8619 Personal history of other infectious and parasitic diseases: Secondary | ICD-10-CM | POA: Diagnosis not present

## 2019-03-15 DIAGNOSIS — R404 Transient alteration of awareness: Secondary | ICD-10-CM | POA: Diagnosis not present

## 2019-03-15 DIAGNOSIS — Z6841 Body Mass Index (BMI) 40.0 and over, adult: Secondary | ICD-10-CM | POA: Diagnosis not present

## 2019-03-15 DIAGNOSIS — Z833 Family history of diabetes mellitus: Secondary | ICD-10-CM | POA: Diagnosis not present

## 2019-03-15 DIAGNOSIS — E1165 Type 2 diabetes mellitus with hyperglycemia: Secondary | ICD-10-CM | POA: Diagnosis not present

## 2019-03-15 DIAGNOSIS — I639 Cerebral infarction, unspecified: Secondary | ICD-10-CM | POA: Diagnosis not present

## 2019-03-15 DIAGNOSIS — G40909 Epilepsy, unspecified, not intractable, without status epilepticus: Secondary | ICD-10-CM | POA: Diagnosis not present

## 2019-03-15 DIAGNOSIS — R403 Persistent vegetative state: Secondary | ICD-10-CM | POA: Diagnosis not present

## 2019-03-15 DIAGNOSIS — J9610 Chronic respiratory failure, unspecified whether with hypoxia or hypercapnia: Secondary | ICD-10-CM | POA: Diagnosis not present

## 2019-03-15 DIAGNOSIS — B948 Sequelae of other specified infectious and parasitic diseases: Secondary | ICD-10-CM | POA: Diagnosis not present

## 2019-03-15 DIAGNOSIS — J1289 Other viral pneumonia: Secondary | ICD-10-CM | POA: Diagnosis not present

## 2019-03-15 DIAGNOSIS — W19XXXA Unspecified fall, initial encounter: Secondary | ICD-10-CM | POA: Diagnosis not present

## 2019-03-15 DIAGNOSIS — R918 Other nonspecific abnormal finding of lung field: Secondary | ICD-10-CM | POA: Diagnosis not present

## 2019-03-15 DIAGNOSIS — L89159 Pressure ulcer of sacral region, unspecified stage: Secondary | ICD-10-CM | POA: Diagnosis not present

## 2019-03-15 DIAGNOSIS — J9611 Chronic respiratory failure with hypoxia: Secondary | ICD-10-CM | POA: Diagnosis not present

## 2019-03-15 DIAGNOSIS — N390 Urinary tract infection, site not specified: Secondary | ICD-10-CM | POA: Diagnosis not present

## 2019-03-15 DIAGNOSIS — Z751 Person awaiting admission to adequate facility elsewhere: Secondary | ICD-10-CM | POA: Diagnosis not present

## 2019-03-15 DIAGNOSIS — I619 Nontraumatic intracerebral hemorrhage, unspecified: Secondary | ICD-10-CM | POA: Diagnosis not present

## 2019-03-15 DIAGNOSIS — G4089 Other seizures: Secondary | ICD-10-CM | POA: Diagnosis not present

## 2019-03-15 DIAGNOSIS — R4701 Aphasia: Secondary | ICD-10-CM | POA: Diagnosis not present

## 2019-03-15 DIAGNOSIS — Z7401 Bed confinement status: Secondary | ICD-10-CM | POA: Diagnosis not present

## 2019-03-15 DIAGNOSIS — I69398 Other sequelae of cerebral infarction: Secondary | ICD-10-CM | POA: Diagnosis not present

## 2019-03-15 DIAGNOSIS — R532 Functional quadriplegia: Secondary | ICD-10-CM | POA: Diagnosis not present

## 2019-03-15 DIAGNOSIS — U071 COVID-19: Secondary | ICD-10-CM | POA: Diagnosis not present

## 2019-03-15 DIAGNOSIS — Z8679 Personal history of other diseases of the circulatory system: Secondary | ICD-10-CM | POA: Diagnosis not present

## 2019-03-15 DIAGNOSIS — I10 Essential (primary) hypertension: Secondary | ICD-10-CM | POA: Diagnosis not present

## 2019-03-15 DIAGNOSIS — Z86718 Personal history of other venous thrombosis and embolism: Secondary | ICD-10-CM | POA: Diagnosis not present

## 2019-03-15 DIAGNOSIS — I6930 Unspecified sequelae of cerebral infarction: Secondary | ICD-10-CM | POA: Diagnosis not present

## 2019-03-15 DIAGNOSIS — Z8673 Personal history of transient ischemic attack (TIA), and cerebral infarction without residual deficits: Secondary | ICD-10-CM | POA: Diagnosis not present

## 2019-03-15 DIAGNOSIS — Z8249 Family history of ischemic heart disease and other diseases of the circulatory system: Secondary | ICD-10-CM | POA: Diagnosis not present

## 2019-03-15 DIAGNOSIS — J9601 Acute respiratory failure with hypoxia: Secondary | ICD-10-CM | POA: Diagnosis not present

## 2019-03-15 DIAGNOSIS — Z931 Gastrostomy status: Secondary | ICD-10-CM | POA: Diagnosis not present

## 2019-03-15 DIAGNOSIS — Z452 Encounter for adjustment and management of vascular access device: Secondary | ICD-10-CM | POA: Diagnosis not present

## 2019-03-15 DIAGNOSIS — L89154 Pressure ulcer of sacral region, stage 4: Secondary | ICD-10-CM | POA: Diagnosis not present

## 2019-03-15 DIAGNOSIS — E119 Type 2 diabetes mellitus without complications: Secondary | ICD-10-CM | POA: Diagnosis not present

## 2019-03-15 DIAGNOSIS — Z794 Long term (current) use of insulin: Secondary | ICD-10-CM | POA: Diagnosis not present

## 2019-03-15 DIAGNOSIS — I69365 Other paralytic syndrome following cerebral infarction, bilateral: Secondary | ICD-10-CM | POA: Diagnosis not present

## 2019-03-15 DIAGNOSIS — Z7982 Long term (current) use of aspirin: Secondary | ICD-10-CM | POA: Diagnosis not present

## 2019-03-15 DIAGNOSIS — Z930 Tracheostomy status: Secondary | ICD-10-CM | POA: Diagnosis not present

## 2019-03-15 DIAGNOSIS — A4189 Other specified sepsis: Secondary | ICD-10-CM | POA: Diagnosis not present

## 2019-04-04 ENCOUNTER — Encounter: Payer: Self-pay | Admitting: Endocrinology

## 2019-04-17 DIAGNOSIS — Z930 Tracheostomy status: Secondary | ICD-10-CM | POA: Diagnosis not present

## 2019-04-18 DIAGNOSIS — I639 Cerebral infarction, unspecified: Secondary | ICD-10-CM | POA: Diagnosis not present

## 2019-04-18 DIAGNOSIS — L8994 Pressure ulcer of unspecified site, stage 4: Secondary | ICD-10-CM | POA: Diagnosis not present

## 2019-04-18 DIAGNOSIS — I6389 Other cerebral infarction: Secondary | ICD-10-CM | POA: Diagnosis not present

## 2019-04-18 DIAGNOSIS — R4182 Altered mental status, unspecified: Secondary | ICD-10-CM | POA: Diagnosis not present

## 2019-04-18 DIAGNOSIS — R532 Functional quadriplegia: Secondary | ICD-10-CM | POA: Diagnosis not present

## 2019-04-26 DIAGNOSIS — Z930 Tracheostomy status: Secondary | ICD-10-CM | POA: Diagnosis not present

## 2019-04-27 ENCOUNTER — Inpatient Hospital Stay: Payer: BC Managed Care – PPO

## 2019-04-27 ENCOUNTER — Inpatient Hospital Stay
Admission: EM | Admit: 2019-04-27 | Discharge: 2019-05-11 | DRG: 871 | Disposition: A | Discharge status: Home Health | Payer: BC Managed Care – PPO | Attending: Internal Medicine | Admitting: Internal Medicine

## 2019-04-27 ENCOUNTER — Other Ambulatory Visit: Payer: Self-pay

## 2019-04-27 ENCOUNTER — Emergency Department: Payer: BC Managed Care – PPO

## 2019-04-27 ENCOUNTER — Encounter: Payer: Self-pay | Admitting: Emergency Medicine

## 2019-04-27 DIAGNOSIS — I6930 Unspecified sequelae of cerebral infarction: Secondary | ICD-10-CM

## 2019-04-27 DIAGNOSIS — T8189XA Other complications of procedures, not elsewhere classified, initial encounter: Secondary | ICD-10-CM | POA: Diagnosis not present

## 2019-04-27 DIAGNOSIS — Z86718 Personal history of other venous thrombosis and embolism: Secondary | ICD-10-CM | POA: Diagnosis not present

## 2019-04-27 DIAGNOSIS — Z8639 Personal history of other endocrine, nutritional and metabolic disease: Secondary | ICD-10-CM | POA: Diagnosis not present

## 2019-04-27 DIAGNOSIS — Z20822 Contact with and (suspected) exposure to covid-19: Secondary | ICD-10-CM | POA: Diagnosis present

## 2019-04-27 DIAGNOSIS — L89154 Pressure ulcer of sacral region, stage 4: Secondary | ICD-10-CM

## 2019-04-27 DIAGNOSIS — R109 Unspecified abdominal pain: Secondary | ICD-10-CM | POA: Diagnosis not present

## 2019-04-27 DIAGNOSIS — I10 Essential (primary) hypertension: Secondary | ICD-10-CM | POA: Diagnosis not present

## 2019-04-27 DIAGNOSIS — E1165 Type 2 diabetes mellitus with hyperglycemia: Secondary | ICD-10-CM | POA: Diagnosis present

## 2019-04-27 DIAGNOSIS — Z95828 Presence of other vascular implants and grafts: Secondary | ICD-10-CM

## 2019-04-27 DIAGNOSIS — L98423 Non-pressure chronic ulcer of back with necrosis of muscle: Secondary | ICD-10-CM | POA: Diagnosis not present

## 2019-04-27 DIAGNOSIS — Z794 Long term (current) use of insulin: Secondary | ICD-10-CM

## 2019-04-27 DIAGNOSIS — E876 Hypokalemia: Secondary | ICD-10-CM | POA: Diagnosis not present

## 2019-04-27 DIAGNOSIS — Z7401 Bed confinement status: Secondary | ICD-10-CM

## 2019-04-27 DIAGNOSIS — I69365 Other paralytic syndrome following cerebral infarction, bilateral: Secondary | ICD-10-CM | POA: Diagnosis not present

## 2019-04-27 DIAGNOSIS — G40909 Epilepsy, unspecified, not intractable, without status epilepticus: Secondary | ICD-10-CM

## 2019-04-27 DIAGNOSIS — L89311 Pressure ulcer of right buttock, stage 1: Secondary | ICD-10-CM | POA: Diagnosis present

## 2019-04-27 DIAGNOSIS — Z930 Tracheostomy status: Secondary | ICD-10-CM | POA: Diagnosis not present

## 2019-04-27 DIAGNOSIS — Z7189 Other specified counseling: Secondary | ICD-10-CM

## 2019-04-27 DIAGNOSIS — R000 Tachycardia, unspecified: Secondary | ICD-10-CM | POA: Diagnosis not present

## 2019-04-27 DIAGNOSIS — Z6841 Body Mass Index (BMI) 40.0 and over, adult: Secondary | ICD-10-CM

## 2019-04-27 DIAGNOSIS — R404 Transient alteration of awareness: Secondary | ICD-10-CM | POA: Diagnosis not present

## 2019-04-27 DIAGNOSIS — Z515 Encounter for palliative care: Secondary | ICD-10-CM

## 2019-04-27 DIAGNOSIS — Z8673 Personal history of transient ischemic attack (TIA), and cerebral infarction without residual deficits: Secondary | ICD-10-CM

## 2019-04-27 DIAGNOSIS — L89322 Pressure ulcer of left buttock, stage 2: Secondary | ICD-10-CM | POA: Diagnosis present

## 2019-04-27 DIAGNOSIS — R339 Retention of urine, unspecified: Secondary | ICD-10-CM | POA: Diagnosis not present

## 2019-04-27 DIAGNOSIS — J9611 Chronic respiratory failure with hypoxia: Secondary | ICD-10-CM | POA: Diagnosis not present

## 2019-04-27 DIAGNOSIS — R1310 Dysphagia, unspecified: Secondary | ICD-10-CM | POA: Diagnosis present

## 2019-04-27 DIAGNOSIS — Z931 Gastrostomy status: Secondary | ICD-10-CM | POA: Diagnosis not present

## 2019-04-27 DIAGNOSIS — G9341 Metabolic encephalopathy: Secondary | ICD-10-CM

## 2019-04-27 DIAGNOSIS — G4733 Obstructive sleep apnea (adult) (pediatric): Secondary | ICD-10-CM

## 2019-04-27 DIAGNOSIS — E871 Hypo-osmolality and hyponatremia: Secondary | ICD-10-CM | POA: Diagnosis present

## 2019-04-27 DIAGNOSIS — R4182 Altered mental status, unspecified: Secondary | ICD-10-CM | POA: Diagnosis not present

## 2019-04-27 DIAGNOSIS — B948 Sequelae of other specified infectious and parasitic diseases: Secondary | ICD-10-CM

## 2019-04-27 DIAGNOSIS — J189 Pneumonia, unspecified organism: Secondary | ICD-10-CM | POA: Diagnosis not present

## 2019-04-27 DIAGNOSIS — Z8674 Personal history of sudden cardiac arrest: Secondary | ICD-10-CM | POA: Diagnosis not present

## 2019-04-27 DIAGNOSIS — A419 Sepsis, unspecified organism: Principal | ICD-10-CM

## 2019-04-27 DIAGNOSIS — J80 Acute respiratory distress syndrome: Secondary | ICD-10-CM | POA: Diagnosis not present

## 2019-04-27 DIAGNOSIS — R600 Localized edema: Secondary | ICD-10-CM

## 2019-04-27 DIAGNOSIS — Z8709 Personal history of other diseases of the respiratory system: Secondary | ICD-10-CM | POA: Diagnosis not present

## 2019-04-27 DIAGNOSIS — I639 Cerebral infarction, unspecified: Secondary | ICD-10-CM | POA: Diagnosis not present

## 2019-04-27 DIAGNOSIS — M2550 Pain in unspecified joint: Secondary | ICD-10-CM | POA: Diagnosis not present

## 2019-04-27 DIAGNOSIS — R532 Functional quadriplegia: Secondary | ICD-10-CM | POA: Diagnosis not present

## 2019-04-27 DIAGNOSIS — E119 Type 2 diabetes mellitus without complications: Secondary | ICD-10-CM | POA: Diagnosis not present

## 2019-04-27 DIAGNOSIS — R52 Pain, unspecified: Secondary | ICD-10-CM | POA: Diagnosis not present

## 2019-04-27 DIAGNOSIS — L98493 Non-pressure chronic ulcer of skin of other sites with necrosis of muscle: Secondary | ICD-10-CM | POA: Diagnosis not present

## 2019-04-27 DIAGNOSIS — Z885 Allergy status to narcotic agent status: Secondary | ICD-10-CM

## 2019-04-27 DIAGNOSIS — Z9981 Dependence on supplemental oxygen: Secondary | ICD-10-CM

## 2019-04-27 DIAGNOSIS — L89150 Pressure ulcer of sacral region, unstageable: Secondary | ICD-10-CM | POA: Diagnosis not present

## 2019-04-27 DIAGNOSIS — I619 Nontraumatic intracerebral hemorrhage, unspecified: Secondary | ICD-10-CM | POA: Diagnosis not present

## 2019-04-27 HISTORY — DX: Type 2 diabetes mellitus without complications: E11.9

## 2019-04-27 HISTORY — DX: Cerebral infarction, unspecified: I63.9

## 2019-04-27 HISTORY — DX: Essential (primary) hypertension: I10

## 2019-04-27 LAB — URINALYSIS, ROUTINE W REFLEX MICROSCOPIC
Bilirubin Urine: NEGATIVE
Glucose, UA: NEGATIVE mg/dL
Ketones, ur: NEGATIVE mg/dL
Nitrite: NEGATIVE
Protein, ur: NEGATIVE mg/dL
Specific Gravity, Urine: 1.009 (ref 1.005–1.030)
Squamous Epithelial / HPF: NONE SEEN (ref 0–5)
pH: 6 (ref 5.0–8.0)

## 2019-04-27 LAB — CBC WITH DIFFERENTIAL/PLATELET
Abs Immature Granulocytes: 0.11 10*3/uL — ABNORMAL HIGH (ref 0.00–0.07)
Basophils Absolute: 0 10*3/uL (ref 0.0–0.1)
Basophils Relative: 0 %
Eosinophils Absolute: 0.3 10*3/uL (ref 0.0–0.5)
Eosinophils Relative: 2 %
HCT: 33 % — ABNORMAL LOW (ref 39.0–52.0)
Hemoglobin: 10.3 g/dL — ABNORMAL LOW (ref 13.0–17.0)
Immature Granulocytes: 1 %
Lymphocytes Relative: 22 %
Lymphs Abs: 3.4 10*3/uL (ref 0.7–4.0)
MCH: 26.5 pg (ref 26.0–34.0)
MCHC: 31.2 g/dL (ref 30.0–36.0)
MCV: 85.1 fL (ref 80.0–100.0)
Monocytes Absolute: 1.2 10*3/uL — ABNORMAL HIGH (ref 0.1–1.0)
Monocytes Relative: 8 %
Neutro Abs: 10.1 10*3/uL — ABNORMAL HIGH (ref 1.7–7.7)
Neutrophils Relative %: 67 %
Platelets: 434 10*3/uL — ABNORMAL HIGH (ref 150–400)
RBC: 3.88 MIL/uL — ABNORMAL LOW (ref 4.22–5.81)
RDW: 13.7 % (ref 11.5–15.5)
WBC: 15.1 10*3/uL — ABNORMAL HIGH (ref 4.0–10.5)
nRBC: 0.4 % — ABNORMAL HIGH (ref 0.0–0.2)

## 2019-04-27 LAB — COMPREHENSIVE METABOLIC PANEL
ALT: 109 U/L — ABNORMAL HIGH (ref 0–44)
AST: 37 U/L (ref 15–41)
Albumin: 2.6 g/dL — ABNORMAL LOW (ref 3.5–5.0)
Alkaline Phosphatase: 70 U/L (ref 38–126)
Anion gap: 11 (ref 5–15)
BUN: 12 mg/dL (ref 6–20)
CO2: 32 mmol/L (ref 22–32)
Calcium: 9.3 mg/dL (ref 8.9–10.3)
Chloride: 88 mmol/L — ABNORMAL LOW (ref 98–111)
Creatinine, Ser: 0.42 mg/dL — ABNORMAL LOW (ref 0.61–1.24)
GFR calc Af Amer: >60 mL/min (ref >60)
GFR calc non Af Amer: >60 mL/min (ref >60)
Glucose, Bld: 247 mg/dL — ABNORMAL HIGH (ref 70–99)
Potassium: 3.5 mmol/L (ref 3.5–5.1)
Sodium: 131 mmol/L — ABNORMAL LOW (ref 135–145)
Total Bilirubin: 0.5 mg/dL (ref 0.3–1.2)
Total Protein: 7.8 g/dL (ref 6.5–8.1)

## 2019-04-27 LAB — C DIFFICILE QUICK SCREEN W PCR REFLEX
C Diff antigen: NEGATIVE
C Diff interpretation: NOT DETECTED
C Diff toxin: NEGATIVE

## 2019-04-27 LAB — APTT: aPTT: 26 seconds (ref 24–36)

## 2019-04-27 LAB — RESPIRATORY PANEL BY RT PCR (FLU A&B, COVID)
Influenza A by PCR: NEGATIVE
Influenza B by PCR: NEGATIVE
SARS Coronavirus 2 by RT PCR: NEGATIVE

## 2019-04-27 LAB — PROTIME-INR
INR: 1.2 (ref 0.8–1.2)
Prothrombin Time: 14.6 seconds (ref 11.4–15.2)

## 2019-04-27 LAB — LACTIC ACID, PLASMA: Lactic Acid, Venous: 1.5 mmol/L (ref 0.5–1.9)

## 2019-04-27 LAB — GLUCOSE, CAPILLARY: Glucose-Capillary: 188 mg/dL — ABNORMAL HIGH (ref 70–99)

## 2019-04-27 LAB — SEDIMENTATION RATE: Sed Rate: 127 mm/hr — ABNORMAL HIGH (ref 0–15)

## 2019-04-27 MED ORDER — SODIUM CHLORIDE 0.9 % IV BOLUS
1000.0000 mL | Freq: Once | INTRAVENOUS | Status: AC
  Administered 2019-04-27: 1000 mL via INTRAVENOUS

## 2019-04-27 MED ORDER — PNEUMOCOCCAL VAC POLYVALENT 25 MCG/0.5ML IJ INJ
0.5000 mL | INJECTION | INTRAMUSCULAR | Status: DC
  Filled 2019-04-27: qty 0.5

## 2019-04-27 MED ORDER — LEVETIRACETAM 750 MG PO TABS
1500.0000 mg | ORAL_TABLET | Freq: Two times a day (BID) | ORAL | Status: DC
  Administered 2019-04-27 – 2019-04-28 (×2): 1500 mg via ORAL
  Filled 2019-04-27 (×3): qty 2

## 2019-04-27 MED ORDER — ASPIRIN EC 81 MG PO TBEC
81.0000 mg | DELAYED_RELEASE_TABLET | Freq: Every day | ORAL | Status: DC
  Administered 2019-04-28 – 2019-05-09 (×12): 81 mg via ORAL
  Filled 2019-04-27 (×13): qty 1

## 2019-04-27 MED ORDER — VANCOMYCIN HCL IN DEXTROSE 1-5 GM/200ML-% IV SOLN
1000.0000 mg | Freq: Once | INTRAVENOUS | Status: AC
  Administered 2019-04-27: 1000 mg via INTRAVENOUS
  Filled 2019-04-27: qty 200

## 2019-04-27 MED ORDER — IOHEXOL 300 MG/ML  SOLN
125.0000 mL | Freq: Once | INTRAMUSCULAR | Status: AC | PRN
  Administered 2019-04-27: 125 mL via INTRAVENOUS

## 2019-04-27 MED ORDER — INSULIN ASPART 100 UNIT/ML ~~LOC~~ SOLN
0.0000 [IU] | Freq: Three times a day (TID) | SUBCUTANEOUS | Status: DC
  Administered 2019-04-28 (×2): 4 [IU] via SUBCUTANEOUS
  Administered 2019-04-29 (×3): 3 [IU] via SUBCUTANEOUS
  Administered 2019-04-30 – 2019-05-01 (×6): 4 [IU] via SUBCUTANEOUS
  Filled 2019-04-27 (×9): qty 1

## 2019-04-27 MED ORDER — ONDANSETRON HCL 4 MG PO TABS: 4.0000 mg | ORAL_TABLET | Freq: Four times a day (QID) | ORAL | Status: DC | PRN

## 2019-04-27 MED ORDER — VANCOMYCIN HCL 1500 MG/300ML IV SOLN
1500.0000 mg | Freq: Once | INTRAVENOUS | Status: AC
  Administered 2019-04-27: 1500 mg via INTRAVENOUS
  Filled 2019-04-27: qty 300

## 2019-04-27 MED ORDER — ONDANSETRON HCL 4 MG/2ML IJ SOLN: 4.0000 mg | Freq: Four times a day (QID) | INTRAMUSCULAR | Status: DC | PRN

## 2019-04-27 MED ORDER — AMLODIPINE BESYLATE 10 MG PO TABS
10.0000 mg | ORAL_TABLET | Freq: Every day | ORAL | Status: DC
  Administered 2019-04-28: 10 mg via ORAL
  Filled 2019-04-27: qty 1

## 2019-04-27 MED ORDER — SODIUM CHLORIDE 0.9 % IV SOLN
INTRAVENOUS | Status: DC | PRN
  Administered 2019-04-27 – 2019-04-28 (×2): 250 mL via INTRAVENOUS

## 2019-04-27 MED ORDER — FAMOTIDINE 20 MG PO TABS
20.0000 mg | ORAL_TABLET | Freq: Two times a day (BID) | ORAL | Status: DC
  Administered 2019-04-28: 20 mg via ORAL
  Filled 2019-04-27 (×2): qty 1

## 2019-04-27 MED ORDER — VANCOMYCIN HCL 10 G IV SOLR
2500.0000 mg | INTRAVENOUS | Status: DC
  Filled 2019-04-27: qty 2500

## 2019-04-27 MED ORDER — INSULIN ASPART 100 UNIT/ML ~~LOC~~ SOLN: 15.0000 [IU] | Freq: Three times a day (TID) | SUBCUTANEOUS | Status: DC

## 2019-04-27 MED ORDER — ENOXAPARIN SODIUM 40 MG/0.4ML ~~LOC~~ SOLN
40.0000 mg | Freq: Two times a day (BID) | SUBCUTANEOUS | Status: DC
  Administered 2019-04-28 – 2019-05-11 (×27): 40 mg via SUBCUTANEOUS
  Filled 2019-04-27 (×27): qty 0.4

## 2019-04-27 MED ORDER — CARVEDILOL 25 MG PO TABS
25.0000 mg | ORAL_TABLET | Freq: Two times a day (BID) | ORAL | Status: DC
  Administered 2019-04-27: 23:00:00 25 mg via ORAL
  Filled 2019-04-27 (×2): qty 1

## 2019-04-27 MED ORDER — CHLORTHALIDONE 25 MG PO TABS
50.0000 mg | ORAL_TABLET | Freq: Every day | ORAL | Status: DC
  Administered 2019-04-28: 50 mg via ORAL
  Filled 2019-04-27: qty 2

## 2019-04-27 MED ORDER — SODIUM CHLORIDE 0.9 % IV SOLN
2.0000 g | Freq: Three times a day (TID) | INTRAVENOUS | Status: DC
  Administered 2019-04-27 – 2019-05-03 (×18): 2 g via INTRAVENOUS
  Filled 2019-04-27 (×24): qty 2

## 2019-04-27 MED ORDER — SODIUM CHLORIDE 0.9 % IV SOLN
2.0000 g | Freq: Once | INTRAVENOUS | Status: AC
  Administered 2019-04-27: 13:00:00 2 g via INTRAVENOUS
  Filled 2019-04-27: qty 2

## 2019-04-27 MED ORDER — ENOXAPARIN SODIUM 40 MG/0.4ML ~~LOC~~ SOLN: 40.0000 mg | Freq: Every day | SUBCUTANEOUS | Status: DC

## 2019-04-27 MED ORDER — METRONIDAZOLE IN NACL 5-0.79 MG/ML-% IV SOLN
500.0000 mg | Freq: Once | INTRAVENOUS | Status: AC
  Administered 2019-04-27: 500 mg via INTRAVENOUS
  Filled 2019-04-27: qty 100

## 2019-04-27 MED ORDER — MAGNESIUM OXIDE 400 (241.3 MG) MG PO TABS
800.0000 mg | ORAL_TABLET | Freq: Every day | ORAL | Status: DC
  Administered 2019-04-28 – 2019-05-11 (×14): 800 mg
  Filled 2019-04-27 (×14): qty 2

## 2019-04-27 MED ORDER — ACETAMINOPHEN 325 MG PO TABS
650.0000 mg | ORAL_TABLET | Freq: Four times a day (QID) | ORAL | Status: DC | PRN
  Administered 2019-04-28 – 2019-05-05 (×3): 650 mg via ORAL
  Filled 2019-04-27 (×4): qty 2

## 2019-04-27 MED ORDER — ACETAMINOPHEN 650 MG RE SUPP: 650.0000 mg | Freq: Four times a day (QID) | RECTAL | Status: DC | PRN

## 2019-04-27 NOTE — H&P (Addendum)
History and Physical:    Paul Hall   MLJ:[id number] DOB: 10-12-1995 DOA: 04/27/2019  Referring MD/provider: Marjean Donna, MD PCP: Cory Munch, PA-C   Patient coming from: Home  Chief Complaint: wound check  History of Present Illness:   Paul Hall is an 24 y.o. male with medical history significant for respiratory failure, failure to wean from ventilator requiring tracheostomy, insulin-dependent diabetes mellitus, shock requiring vasopressors, hypertension, S[place]atia urinary tract infection, stroke leading to quadriplegia, seizures, subdural hematoma, bilateral lower extremity DVT status post IVC filter placement 02/16/2019, dysphagia status post PEG tube placement, urinary retention with chronic indwelling Foley catheter, [id number] infection complicated by ARDS requiring ECMO.  He was admitted to [facility] in [place] on 01/20/2019 and discharged to acute rehab on 03/15/2019.  According to his wife at the bedside, patient was discharged from the rehab facility a few weeks ago.    Patient is nonverbal and unable to provide any history so history was obtained from chart review and his wife at the bedside.  His wife said patient was brought to the emergency room at the recommendation of his PCP for evaluation of sacral decubitus ulcer.  She said she developed a sacral ulcer from his hospitalization at [facility].  She said the patient could not be turned while he was on ECMO and that led to the development of the ulcer.  She also said that there has been intermittent drainage from the wound.  She also said PCP was concerned about the tracheostomy and said that patient no longer required a tracheostomy wanted to be removed.  She does not report any fever or vomiting.  His wife said that patient is nonverbal but sometimes he can move his extremities from side to side.  ED Course:  The patient was tachypneic and tachycardic in the emergency room.  There was concern for sepsis  secondary to sacral wound infection so he was given 2 L of normal saline, IV cefepime, IV Flagyl and vancomycin in the emergency room.  ROS:   ROS Unable to obtain  Past Medical History:   Past Medical History:  Diagnosis Date  . Diabetes mellitus ([place])   . Hypertension   . Stroke [facility])     Past Surgical History:   Past Surgical History:  Procedure Laterality Date  . IVC FILTER PLACEMENT ([facility] HX)    . LEG SURGERY    . PEG TUBE PLACEMENT    . TRACHEOSTOMY      Social History:   Social History   Socioeconomic History  . Marital status: Married    Spouse name: Not on file  . Number of children: Not on file  . Years of education: Not on file  . Highest education level: Not on file  Occupational History  . Not on file  Tobacco Use  . Smoking status: Never Smoker  Substance and Sexual Activity  . Alcohol use: No  . Drug use: No  . Sexual activity: Not on file  Other Topics Concern  . Not on file  Social History Narrative  . Not on file   Social Determinants of Health   Financial Resource Strain:   . Difficulty of Paying Living Expenses: Not on file  Food Insecurity:   . Worried About Charity fundraiser in the Last Year: Not on file  . Ran Out of Food in the Last Year: Not on file  Transportation Needs:   . Lack of Transportation (Medical): Not on file  . Lack of  Transportation (Non-Medical): Not on file  Physical Activity:   . Days of Exercise per Week: Not on file  . Minutes of Exercise per Session: Not on file  Stress:   . Feeling of Stress : Not on file  Social Connections:   . Frequency of Communication with Friends and Family: Not on file  . Frequency of Social Gatherings with Friends and Family: Not on file  . Attends Religious Services: Not on file  . Active Member of Clubs or Organizations: Not on file  . Attends Archivist Meetings: Not on file  . Marital Status: Not on file  Intimate Partner Violence:   . Fear of Current or  Ex-Partner: Not on file  . Emotionally Abused: Not on file  . Physically Abused: Not on file  . Sexually Abused: Not on file    Allergies   Morphine and Benzonatate  Family history:   History reviewed. Unable to obtain from patient as he's nonverbal  Current Medications:   Prior to Admission medications   Not on File    Physical Exam:   Vitals:   04/27/19 1040 04/27/19 1300 04/27/19 1330 04/27/19 1430  BP:  128/81 131/87 (!) 133/91  Pulse:  (!) 116 (!) 112 (!) 114  Temp: 99.3 F (37.4 C)     TempSrc: Axillary     SpO2:  95% 96% 98%     Physical Exam: Blood pressure (!) 133/91, pulse (!) 114, temperature 99.3 F (37.4 C), temperature source Axillary, SpO2 98 %. Gen: No acute distress. Head: Normocephalic, atraumatic. Eyes: Patient kept his eyes closed and I couldn't open it to examine the eyes Mouth: Moist mucus membranes Neck: Supple, tracheostomy. Chest: Lungs are clear to auscultation with good air movement. No rales, rhonchi or wheezes.  CV: Heart sounds are regular, tachycardic. No murmurs, rubs or gallops.  Abdomen: Soft, nontender, obese with normal active bowel sounds. No palpable masses.+ PEG tube Extremities: Extremities are without clubbing, or cyanosis. No edema. Pedal pulses 2+.  Skin: Warm and dry. Stage 4 sacral decubitus ulcer packed with gauze. Neuro: Nonverbal, he does not move any of his extremities Psych: Calm GU: Foley catheter in place   Data Review:    Labs: Basic Metabolic Panel: Recent Labs  Lab 04/27/19 1147  NA 131*  K 3.5  CL 88*  CO2 32  GLUCOSE 247*  BUN 12  CREATININE 0.42*  CALCIUM 9.3   Liver Function Tests: Recent Labs  Lab 04/27/19 1147  AST 37  ALT 109*  ALKPHOS 70  BILITOT 0.5  PROT 7.8  ALBUMIN 2.6*   No results for input(s): LIPASE, AMYLASE in the last 168 hours. No results for input(s): AMMONIA in the last 168 hours. CBC: Recent Labs  Lab 04/27/19 1147  WBC 15.1*  NEUTROABS 10.1*  HGB 10.3*   HCT 33.0*  MCV 85.1  PLT 434*   Cardiac Enzymes: No results for input(s): CKTOTAL, CKMB, CKMBINDEX, TROPONINI in the last 168 hours.  BNP (last 3 results) No results for input(s): PROBNP in the last 8760 hours. CBG: No results for input(s): GLUCAP in the last 168 hours.  Urinalysis    Component Value Date/Time   COLORURINE YELLOW (A) 04/27/2019 1243   APPEARANCEUR HAZY (A) 04/27/2019 1243   LABSPEC 1.009 04/27/2019 1243   PHURINE 6.0 04/27/2019 1243   GLUCOSEU NEGATIVE 04/27/2019 1243   HGBUR SMALL (A) 04/27/2019 1243   BILIRUBINUR NEGATIVE 04/27/2019 [place] 04/27/2019 1243   PROTEINUR NEGATIVE 04/27/2019 1243  NITRITE NEGATIVE 04/27/2019 1243   LEUKOCYTESUR MODERATE (A) 04/27/2019 1243      Radiographic Studies: DG Chest Port 1 View  Result Date: 04/27/2019 CLINICAL DATA:  Sepsis. EXAM: PORTABLE CHEST 1 VIEW COMPARISON:  Single-view of the chest 03/16/2019 and 01/20/2019. FINDINGS: Tracheostomy tube is unchanged. Lungs are clear. Heart size is normal. No pneumothorax or pleural fluid. No acute or focal bony abnormality. IMPRESSION: No acute disease. Electronically Signed   By: Inge Rise M.D.   On: 04/27/2019 13:19      Assessment/Plan:   Principal Problem:   Sepsis ([place]) Active Problems:   H/O insulin dependent diabetes mellitus   History of stroke   Seizure disorder ([place])   Sacral decubitus ulcer, stage IV (HCC)   BMI 64.2 (morbid obesity)  Sepsis probably due to sacral wound infection/leukocytosis: Admit to MedSurg and monitor on telemetry.  Lactic acid is normal but ESR is elevated, 127.  Treat with empiric IV antibiotics.  Patient received 2 L of normal saline in the ED.  Follow-up blood cultures.  CT pelvis has been ordered for further evaluation of sacral wound.  Hyponatremia: Patient got 2 L of normal saline in the ED.  Follow-up BMP.  Hypertension: Continue antihypertensives  Insulin-dependent diabetes mellitus: Continue  insulin and monitor glucose levels  History of stroke with quadriplegia: Continue aspirin  Seizure disorder: Continue Keppra  Dysphagia: Continue enteral nutrition.  Consulted dietitian to assist with nutrition  Urinary retention with abnormal urinalysis: Chronic indwelling Foley catheter in place.  Abnormal urinalysis is probably due to indwelling Foley catheter.  S/p tracheostomy: Patient's wife requested that tracheostomy be removed.  Consult ENT for evaluation.  History of bilateral lower extremity DVT status post IVC filter placement: Continue Lovenox for DVT prophylaxis  Other information:   DVT prophylaxis: Lovenox Code Status: .Full code Family Communication: Plan discussed with his wife at the bedside Disposition Plan: Home in 2 to 3 days Consults called: None Admission status: Inatient   The medical decision making on this patient was of high complexity and the patient is at high risk for clinical deterioration, therefore this is a level 3 visit.  Time spent 75 minutes  [facility]   How to contact the [facility] Attending or Consulting provider [place] or covering provider during after hours [place], for this patient?   1. Check the care team in [facility] and look for a) attending/consulting [facility] provider listed and b) the [facility] team listed 2. Log into [email or web address] and use [place]'s universal password to access. If you do not have the password, please contact the hospital operator. 3. Locate the [facility] provider you are looking for under [facility] and page to a number that you can be directly reached. 4. If you still have difficulty reaching the provider, please page the [facility] (Director on Call) for the Hospitalists listed on amion for assistance.  04/27/2019, 3:47 PM

## 2019-04-27 NOTE — Progress Notes (Signed)
Patient seen earlier in ed. No distress noted during my assessment. On trach collar 28% due to some plugging earlier during the day. BBS noted to be clear when seen. Patient with #8 cfn trach. Airway patent and secure.

## 2019-04-27 NOTE — Progress Notes (Signed)
CODE SEPSIS - PHARMACY COMMUNICATION  **Broad Spectrum Antibiotics should be administered within 1 hour of Sepsis diagnosis**  Time Code Sepsis Called/Page Received: 1230  Antibiotics Ordered: cefepime, vancomycin, flaygl   Time of 1st antibiotic administration: 1326   Ronnald Ramp ,PharmD Clinical Pharmacist  04/27/2019  1:29 PM

## 2019-04-27 NOTE — ED Provider Notes (Signed)
[facility] Emergency Department Provider Note  ____________________________________________   First MD Initiated Contact with Patient 04/27/19 1045     (approximate)  I have reviewed the triage vital signs and the nursing notes.   HISTORY  Chief Complaint Wound Check    HPI Paul Hall is a 24 y.o. male who is bedbound, trach and nonverbal due to a series of strokes in October of last year  in setting of covid who comes in for a wound check.  Patient has a wound on his sacrum that has been worsening.  His primary doctor prescribed him Keflex 10 days ago but not really improving it.  The noted drainage from it.  Unable to get full HPI from patient due to nonverbal at baseline          History reviewed. No pertinent past medical history.  There are no problems to display for this patient.   Past Surgical History:  Procedure Laterality Date  . LEG SURGERY      Prior to Admission medications   Medication Sig Start Date End Date Taking? Authorizing Provider  loperamide (IMODIUM) 2 MG capsule Take 1 capsule (2 mg total) by mouth 4 (four) times daily as needed for diarrhea or loose stools. 07/04/15   Ward, Layla Maw, DO  ondansetron (ZOFRAN ODT) 4 MG disintegrating tablet Take 1 tablet (4 mg total) by mouth every 8 (eight) hours as needed for nausea or vomiting. 07/04/15   Ward, Layla Maw, DO    Allergies Patient has no known allergies.  No family history on file.  Social History Social History   Tobacco Use  . Smoking status: Never Smoker  Substance Use Topics  . Alcohol use: No  . Drug use: No      Review of Systems Unable to get full review of systems at baseline due to nonverbal.  ___________________   PHYSICAL EXAM:  VITAL SIGNS: ED Triage Vitals [04/27/19 1033]  Enc Vitals Group     BP      Pulse      Resp      Temp      Temp src      SpO2 96 %     Weight      Height      Head Circumference      Peak Flow        Pain Score      Pain Loc      Pain Edu?      Excl. in GC?     Constitutional: Nonverbal at baseline, occasionally moves his head around Eyes: Conjunctivae are normal.  Pupils equal size. Head: Atraumatic. Nose: No congestion/rhinnorhea. Mouth/Throat: Mucous membranes are moist.   Neck: No stridor. Trachea Midline. FROM Cardiovascular: Normal rate, regular rhythm. Grossly normal heart sounds.  Good peripheral circulation. Respiratory: Normal respiratory effort.  No retractions. Lungs CTAB. Gastrointestinal: Soft and nontender.  Foley in place.  No distention. No abdominal bruits.  Musculoskeletal: No lower extremity tenderness nor edema.  No joint effusions. Neurologic: Baseline nonverbal, occasionally spontaneously moves arms and legs Skin: Large ulcer stage IV with packing. Psychiatric: Unable to fully assess due to baseline GU: Deferred   ____________________________________________   LABS (all labs ordered are listed, but only abnormal results are displayed)  Labs Reviewed  CBC WITH DIFFERENTIAL/PLATELET - Abnormal; Notable for the following components:      Result Value   WBC 15.1 (*)    RBC 3.88 (*)    Hemoglobin 10.3 (*)  HCT 33.0 (*)    Platelets 434 (*)    nRBC 0.4 (*)    Neutro Abs 10.1 (*)    Monocytes Absolute 1.2 (*)    Abs Immature Granulocytes 0.11 (*)    All other components within normal limits  COMPREHENSIVE METABOLIC PANEL - Abnormal; Notable for the following components:   Sodium 131 (*)    Chloride 88 (*)    Glucose, Bld 247 (*)    Creatinine, Ser 0.42 (*)    Albumin 2.6 (*)    ALT 109 (*)    All other components within normal limits  SEDIMENTATION RATE - Abnormal; Notable for the following components:   Sed Rate 127 (*)    All other components within normal limits  URINALYSIS, ROUTINE W REFLEX MICROSCOPIC - Abnormal; Notable for the following components:   Color, Urine YELLOW (*)    APPearance HAZY (*)    Hgb urine dipstick SMALL (*)     Leukocytes,Ua MODERATE (*)    Bacteria, UA FEW (*)    All other components within normal limits  RESPIRATORY PANEL BY RT PCR (FLU A&B, COVID)  C DIFFICILE QUICK SCREEN W PCR REFLEX  CULTURE, BLOOD (ROUTINE X 2)  CULTURE, BLOOD (ROUTINE X 2)  URINE CULTURE  LACTIC ACID, PLASMA  APTT  PROTIME-INR  LACTIC ACID, PLASMA   ____________________________________________   ED ECG REPORT I, Vanessa Inverness, the attending physician, personally viewed and interpreted this ECG.  Pending  ____________________________________________  RADIOLOGY Robert Bellow, personally viewed and evaluated these images (plain radiographs) as part of my medical decision making, as well as reviewing the written report by the radiologist.  ED MD interpretation:  No PNA   Official radiology report(s): DG Chest Port 1 View  Result Date: 04/27/2019 CLINICAL DATA:  Sepsis. EXAM: PORTABLE CHEST 1 VIEW COMPARISON:  Single-view of the chest 03/16/2019 and 01/20/2019. FINDINGS: Tracheostomy tube is unchanged. Lungs are clear. Heart size is normal. No pneumothorax or pleural fluid. No acute or focal bony abnormality. IMPRESSION: No acute disease. Electronically Signed   By: Inge Rise M.D.   On: 04/27/2019 13:19    ____________________________________________   PROCEDURES  Procedure(s) performed (including Critical Care):  .Critical Care Performed by: Vanessa [place], MD Authorized by: Vanessa Schofield, MD   Critical care provider statement:    Critical care time (minutes):  45   Critical care was necessary to treat or prevent imminent or life-threatening deterioration of the following conditions:  Sepsis   Critical care was time spent personally by me on the following activities:  Discussions with consultants, evaluation of patient's response to treatment, examination of patient, ordering and performing treatments and interventions, ordering and review of laboratory studies, ordering and review of  radiographic studies, pulse oximetry, re-evaluation of patient's condition, obtaining history from patient or surrogate and review of old charts     ____________________________________________   INITIAL IMPRESSION / [place] / ED COURSE  Paul Hall was evaluated in Emergency Department on 04/27/2019 for the symptoms described in the history of present illness. He was evaluated in the context of the global COVID-19 pandemic, which necessitated consideration that the patient might be at risk for infection with the SARS-CoV-2 virus that causes COVID-19. Institutional protocols and algorithms that pertain to the evaluation of patients at risk for COVID-19 are in a state of rapid change based on information released by regulatory bodies including the CDC and federal and state organizations. These policies and algorithms were followed  during the patient's care in the ED.    Patient is a 24 year old with very complex medical history including Covid, trach, G-tube, Foley, large sacral wound who comes in with concerns for infection.  Patient does have a very large sacral room and his temperature is slightly elevated with fast heart rates and elevated white count.  Sepsis alert was called.  Will get chest x-ray to evaluate for pneumonia, will get urine to evaluate for UTI.  Consider source of the back ulcer.  Will get repeat Covid swab but that seems less likely.  Sepsis alert called and patient start on broad-spectrum antibiotics.  Patient will require admission to the hospital.  Labs concerning for infection with elevated sed rate and CBC.  Concern for possible ulcer of the back.  Discussed the hospital team for admission.  CT scan was pending at time of admission.       ____________________________________________   FINAL CLINICAL IMPRESSION(S) / ED DIAGNOSES   Final diagnoses:  Sepsis, due to unspecified organism, unspecified whether acute organ dysfunction present (HCC)   Skin ulcer of sacrum with necrosis of muscle (HCC)      MEDICATIONS GIVEN DURING THIS VISIT:  Medications  vancomycin (VANCOCIN) IVPB 1000 mg/200 mL premix (has no administration in time range)  ceFEPIme (MAXIPIME) 2 g in sodium chloride 0.9 % 100 mL IVPB (0 g Intravenous Stopped 04/27/19 1356)  metroNIDAZOLE (FLAGYL) IVPB 500 mg (500 mg Intravenous New Bag/Given 04/27/19 1406)  sodium chloride 0.9 % bolus 1,000 mL (1,000 mLs Intravenous New Bag/Given 04/27/19 1325)  sodium chloride 0.9 % bolus 1,000 mL (1,000 mLs Intravenous New Bag/Given 04/27/19 1345)  iohexol (OMNIPAQUE) 300 MG/ML solution 125 mL (125 mLs Intravenous Contrast Given 04/27/19 1509)     ED Discharge Orders    None       Note:  This document was prepared using Dragon voice recognition software and may include unintentional dictation errors.   Concha Se, MD 04/27/19 1530

## 2019-04-27 NOTE — Progress Notes (Signed)
RT note: Called by RN to evaluate tracheostomy.  Patient found on room air with periodic desaturation to 85% range.  Placed on ATC and suctioned for large amounts of thick, tenacious sputum.  Inner cannula removed and cleaned with mucous plugging noted.  Patient's work of breathing noted to be improved after procedure.    Per spouse at bedside family was utilizing nebulizer at home for humidity and was cleaning inner cannula BID as they did not have a replacement.  Trach site appears clean and well maintained.  Spouse states patient utilizes passey-muir at home without noted respiratory distress.

## 2019-04-27 NOTE — ED Notes (Signed)
Patient to ct

## 2019-04-27 NOTE — ED Triage Notes (Signed)
24 yo male bib [facility] for wound check.  Patient is bed bound, trached, and non-verbal d/t a series of strokes since October last year (covid infection as well).  Has feeding tube, foley.  Not paralyzed per EMS but isnt moving extremities.  He appears alert, trach clean and patent, no distress noted currently.

## 2019-04-27 NOTE — ED Notes (Signed)
Patient returned from ct

## 2019-04-27 NOTE — Progress Notes (Signed)
Pharmacy Antibiotic Note  Paul Hall is a 24 y.o. male admitted on 04/27/2019 with sepsis.  Pharmacy has been consulted for Vancomycin, Cefepime  dosing.  Plan: Cefepime 2 gm IV Q8H.  Vancomycin 1 gm IV X 1 given on 1/23 @ 1547.  Additional Vancomycin 1500 mg IV X 1 given @ 1650 to make total loading dose of 2500 mg.  Vancomycin 2500 mg IV Q24H ordered to start on 1/24 @ 1600.  No peak or trough currently ordered.   Vd = 82.1 L  Ke = 0.12 hr-1 T1/2 = 5.6 hrs AUC = 492.7  Css = 12.2 mcg/mL   Height: 5\' 9"  (175.3 cm) Weight: (!) 362 lb (164.2 kg) IBW/kg (Calculated) : 70.7  Temp (24hrs), Avg:99.3 F (37.4 C), Min:99.3 F (37.4 C), Max:99.3 F (37.4 C)  Recent Labs  Lab 04/27/19 1147 04/27/19 1241  WBC 15.1*  --   CREATININE 0.42*  --   LATICACIDVEN  --  1.5    Estimated Creatinine Clearance: 219.6 mL/min (A) (by C-G formula based on SCr of 0.42 mg/dL (L)).    Allergies  Allergen Reactions  . Morphine Hives    Patient has tolerated oxycodone at high amounts during Nov 2020 hospitalization  . Benzonatate Other (See Comments)    Antimicrobials this admission:   >>    >>   Dose adjustments this admission:   Microbiology results:  BCx:   UCx:    Sputum:    MRSA PCR:   Thank you for allowing pharmacy to be a part of this patient's care.  Ariea Rochin D 04/27/2019 6:56 PM

## 2019-04-27 NOTE — Progress Notes (Signed)
Anticoagulation monitoring(Lovenox):  24 yo male ordered Lovenox 40 mg Q24h  Filed Weights   04/27/19 1844  Weight: (!) 362 lb (164.2 kg)   BMI 53.46   Lab Results  Component Value Date   CREATININE 0.42 (L) 04/27/2019   CREATININE 1.31 (H) 07/04/2015   Estimated Creatinine Clearance: 219.6 mL/min (A) (by C-G formula based on SCr of 0.42 mg/dL (L)). Hemoglobin & Hematocrit     Component Value Date/Time   HGB 10.3 (L) 04/27/2019 1147   HCT 33.0 (L) 04/27/2019 1147     Per Protocol for Patient with estCrcl > 30 ml/min and BMI > 40, will transition to Lovenox 40 mg Q12h.

## 2019-04-27 NOTE — Consult Note (Signed)
PHARMACY -  BRIEF ANTIBIOTIC NOTE   Pharmacy has received consult(s) for sepsis from an ED provider.  The patient's profile has been reviewed for ht/wt/allergies/indication/available labs.    One time order(s) placed for cefepime and vancomycin and flagyl.   Further antibiotics/pharmacy consults should be ordered by admitting physician if indicated.                       Thank you, Ronnald Ramp 04/27/2019  12:32 PM

## 2019-04-28 ENCOUNTER — Encounter: Payer: Self-pay | Admitting: Internal Medicine

## 2019-04-28 DIAGNOSIS — L89150 Pressure ulcer of sacral region, unstageable: Secondary | ICD-10-CM

## 2019-04-28 LAB — CBC
HCT: 33 % — ABNORMAL LOW (ref 39.0–52.0)
Hemoglobin: 10.2 g/dL — ABNORMAL LOW (ref 13.0–17.0)
MCH: 26.4 pg (ref 26.0–34.0)
MCHC: 30.9 g/dL (ref 30.0–36.0)
MCV: 85.3 fL (ref 80.0–100.0)
Platelets: 375 10*3/uL (ref 150–400)
RBC: 3.87 MIL/uL — ABNORMAL LOW (ref 4.22–5.81)
RDW: 13.9 % (ref 11.5–15.5)
WBC: 11.7 10*3/uL — ABNORMAL HIGH (ref 4.0–10.5)
nRBC: 0.2 % (ref 0.0–0.2)

## 2019-04-28 LAB — GLUCOSE, CAPILLARY
Glucose-Capillary: 160 mg/dL — ABNORMAL HIGH (ref 70–99)
Glucose-Capillary: 156 mg/dL — ABNORMAL HIGH (ref 70–99)
Glucose-Capillary: 168 mg/dL — ABNORMAL HIGH (ref 70–99)

## 2019-04-28 LAB — BASIC METABOLIC PANEL
Anion gap: 12 (ref 5–15)
BUN: 9 mg/dL (ref 6–20)
CO2: 30 mmol/L (ref 22–32)
Calcium: 9.2 mg/dL (ref 8.9–10.3)
Chloride: 95 mmol/L — ABNORMAL LOW (ref 98–111)
Creatinine, Ser: 0.41 mg/dL — ABNORMAL LOW (ref 0.61–1.24)
GFR calc Af Amer: >60 mL/min (ref >60)
GFR calc non Af Amer: >60 mL/min (ref >60)
Glucose, Bld: 181 mg/dL — ABNORMAL HIGH (ref 70–99)
Potassium: 3.8 mmol/L (ref 3.5–5.1)
Sodium: 137 mmol/L (ref 135–145)

## 2019-04-28 LAB — HIV ANTIBODY (ROUTINE TESTING W REFLEX): HIV Screen 4th Generation wRfx: NONREACTIVE

## 2019-04-28 LAB — LACTIC ACID, PLASMA: Lactic Acid, Venous: 1.1 mmol/L (ref 0.5–1.9)

## 2019-04-28 MED ORDER — CHLORHEXIDINE GLUCONATE CLOTH 2 % EX PADS
6.0000 | MEDICATED_PAD | Freq: Every day | CUTANEOUS | Status: DC
  Administered 2019-04-28 – 2019-05-10 (×12): 6 via TOPICAL

## 2019-04-28 MED ORDER — OSMOLITE 1.2 CAL PO LIQD
1000.0000 mL | ORAL | Status: DC
  Administered 2019-04-28: 1000 mL

## 2019-04-28 MED ORDER — VANCOMYCIN HCL 1500 MG/300ML IV SOLN
1500.0000 mg | Freq: Three times a day (TID) | INTRAVENOUS | Status: DC
  Administered 2019-04-28 – 2019-04-30 (×7): 1500 mg via INTRAVENOUS
  Filled 2019-04-28 (×10): qty 300

## 2019-04-28 MED ORDER — ORAL CARE MOUTH RINSE
15.0000 mL | Freq: Two times a day (BID) | OROMUCOSAL | Status: DC
  Administered 2019-04-28 – 2019-05-10 (×26): 15 mL via OROMUCOSAL

## 2019-04-28 MED ORDER — LEVETIRACETAM 100 MG/ML PO SOLN
1500.0000 mg | Freq: Two times a day (BID) | ORAL | Status: DC
  Administered 2019-04-29 – 2019-05-04 (×13): 1500 mg
  Filled 2019-04-28 (×20): qty 15

## 2019-04-28 MED ORDER — FAMOTIDINE 40 MG/5ML PO SUSR
20.0000 mg | Freq: Two times a day (BID) | ORAL | Status: DC
  Administered 2019-04-29 – 2019-04-30 (×4): 20 mg
  Filled 2019-04-28 (×5): qty 2.5

## 2019-04-28 NOTE — Progress Notes (Signed)
PHARMACY - PHYSICIAN COMMUNICATION CRITICAL VALUE ALERT - BLOOD CULTURE IDENTIFICATION (BCID)  Pavlos Yon is an 24 y.o. male who presented to [facility] on 04/27/2019 with a chief complaint of sepsis  Assessment:  Lab reports 1 of 4 bottles w/ GPR  Name of physician (or Provider) Contacted: Reyes Ivan NP  Current antibiotics: Vancomycin and Cefepime  Changes to prescribed antibiotics recommended:  Patient is on recommended antibiotics - No changes needed  No results found for this or any previous visit.  Valrie Hart A 04/28/2019  10:31 PM

## 2019-04-28 NOTE — Progress Notes (Addendum)
    BRIEF OVERNIGHT PROGRESS REPORT   SUBJECTIVE: Wound assessment  OBJECTIVE: Extensively Deep sacral wound irregular in shape with scalloped edges and tunneling. Noted serosanguineous drainage and foul smell.  See wound below     ASSESSMENT: 24 y.o. male with medical history significant for respiratory failure, failure to wean from ventilator requiring tracheostomy, insulin-dependent diabetes mellitus, shock requiring vasopressors, hypertension, Serratia urinary tract infection, stroke leading to quadriplegia, seizures, subdural hematoma, bilateral lower extremity DVT status post IVC filter placement, dysphagia s/p PEG tube placement, urinary retention with chronic indwelling Foley catheter, COVID-19 infection complicated by ARDS requiring ECMO now admitted with sepsis.  PLAN: Sepsis - Patient presents with fever, tachycardia, tachypnea with leukocytosis. - Suspect sacral wound as source of infection - CT abdomen and pelvis - Prior Covid infection >21 days - UA shows moderate leukocytosis and few bacteria - Urine and blood cultures pending - Continue empiric abx with cefepime, vancomycin and Flagyl - IVFs and PRN bolus to keep MAP<36mmHg or SBP <87mmHg  2. Unstageable sacral wound  -likely secondary to MASD/friction related in the setting of his morbid obesity and body habitus. - Surgical consult for possible debridement and wound vac placement? Consult placed to Dr. Claudine Mouton and message sent via [facility]. Will see in the am. - Wound consult   Webb Silversmith, DNP, CCRN, FNP-C [facility] Nurse Practitioner Between 7pm to 7am - Pager [phone]  After 7am go to [email or web address] - [email or web address]  [employer]  [phone]

## 2019-04-28 NOTE — Consult Note (Signed)
Patient ID: Paul Hall, male   DOB: 02-23-1996, 24 y.o.   MRN: [id number]   Consult requested by: Jimmye Norman, NP for sacral decubitus, and sepsis.   Chief Complaint:  Sepsis, from sacral decubitus  History of Present Illness Paul Hall is a 24 y.o. male with no one currently present in room to obtain further history, he was admitted with fever of 101.2 axillary yesterday and after work-up felt his sacrococcygeal stage III decubitus was sufficiently infected to be contributing to his sepsis: HPI from H&P:  Paul Hall is an 24 y.o. 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  He was admitted to [facility] in [place] on 01/20/2019 and discharged to acute rehab on 03/15/2019.  According to his wife at the bedside, patient was discharged from the rehab facility a few weeks ago.    Patient is nonverbal and unable to provide any history so history was obtained from chart review and his wife at the bedside.  His wife said patient was brought to the emergency room at the recommendation of his PCP for evaluation of sacral decubitus ulcer.  She said she developed a sacral ulcer from his hospitalization at [facility].  She said the patient could not be turned hold while she was on ECMO diuretic development of the ulcer.  She also said that there has been intermittent drainage from the wound.  She also said PCP was concerned about the tracheostomy and said that patient no longer required a tracheostomy  wanted to be removed.  She does not report any fever or vomiting.  His wife admits that patient is nonverbal but sometimes he can move his extremities from side to side.  ED Course:  The patient was tachypneic and tachycardic in the emergency room.  There was concern for sepsis secondary to sacral wound infection so he was given 2 L of normal saline, IV cefepime, IV Flagyl and vancomycin in the emergency room.  Past Medical History Past Medical History:  Diagnosis Date  . Diabetes mellitus (HCC)   . Hypertension   . Stroke [facility])       Past Surgical History:  Procedure Laterality Date  . IVC FILTER PLACEMENT ([facility] HX)    . LEG SURGERY    . PEG TUBE PLACEMENT    . TRACHEOSTOMY      Allergies  Allergen Reactions  . Morphine Hives    Patient has tolerated oxycodone at high amounts during Nov 2020 hospitalization  . Benzonatate Other (See Comments)    Current Facility-Administered Medications  Medication Dose Route Frequency Provider Last Rate Last Admin  . 0.9 %  sodium chloride infusion   Intravenous PRN Lurene Shadow, MD 10 mL/hr at 04/28/19 0630 250 mL at 04/28/19 0630  . acetaminophen (TYLENOL) tablet 650 mg  650 mg Oral Q6H PRN Lurene Shadow, MD   650 mg at 04/28/19 0138   Or  . acetaminophen (TYLENOL) suppository 650 mg  650 mg Rectal Q6H PRN Lurene Shadow, MD      . amLODipine (NORVASC) tablet 10 mg  10 mg Oral Daily Lurene Shadow, MD      . aspirin EC tablet 81 mg  81 mg Oral Daily Lurene Shadow, MD      . carvedilol (COREG) tablet 25 mg  25 mg Oral BID Lurene Shadow, MD   25 mg at 04/27/19 2317  . ceFEPIme (MAXIPIME) 2 g in sodium chloride 0.9 % 100 mL IVPB  2 g Intravenous Q8H Lurene Shadow, MD 200 mL/hr at 04/28/19 0631 2 g at 04/28/19 0631  . Chlorhexidine Gluconate Cloth 2 % PADS 6 each  6 each Topical Daily Lurene Shadow, MD      . chlorthalidone (HYGROTON) tablet 50 mg  50 mg Oral Daily Lurene Shadow, MD      . enoxaparin (LOVENOX) injection 40 mg  40 mg  Subcutaneous Q12H Lurene Shadow, MD      . famotidine (PEPCID) tablet 20 mg  20 mg Oral BID Lurene Shadow, MD      . insulin aspart (novoLOG) injection 0-20 Units  0-20 Units Subcutaneous TID WC Lurene Shadow, MD   4 Units at 04/28/19 [phone]  . levETIRAcetam (KEPPRA) tablet 1,500 mg  1,500 mg Oral BID Lurene Shadow, MD   1,500 mg at 04/27/19 2316  . magnesium oxide (MAG-OX) tablet 800 mg  800 mg Per Tube Daily Lurene Shadow, MD      . MEDLINE mouth rinse  15 mL Mouth Rinse q12n4p Lurene Shadow, MD      . ondansetron [facility]) tablet 4 mg  4 mg Oral Q6H PRN Lurene Shadow, MD       Or  . ondansetron (ZOFRAN) injection 4 mg  4 mg Intravenous Q6H PRN Lurene Shadow, MD      . pneumococcal 23 valent vaccine (PNEUMOVAX-23) injection 0.5 mL  0.5 mL Intramuscular Tomorrow-1000 Lurene Shadow, MD      . vancomycin (VANCOCIN) 2,500 mg in sodium chloride 0.9 % 500 mL IVPB  2,500 mg Intravenous Q24H Lurene Shadow, MD        Family History History reviewed. No pertinent family history.    Social History Social History   Tobacco Use  . Smoking status: Never Smoker  . Smokeless tobacco: Never Used  Substance Use Topics  . Alcohol use: No  . Drug use: No        Review of Systems  Unable to perform ROS: Patient unresponsive      Physical Exam Blood pressure (!) 102/47, pulse 97, temperature 98.6 F (37 C), temperature source Axillary, resp. rate (!) 24, height 5\' 9"  (1.753 m), weight (!) 164.2 kg, SpO2 100 %. Last Weight  Most recent update: 04/27/2019  6:44 PM   Weight  164.2 kg (362 lb)              CONSTITUTIONAL: Morbidly obese African-American male minimally responsive without distress.  EYES: Sclera non-icteric.   EARS, NOSE, MOUTH AND THROAT:  Trach collar present.   RESPIRATORY:  Lungs are clear, and breath sounds are equal bilaterally. Normal respiratory effort without pathologic use of accessory muscles. CARDIOVASCULAR: Heart is regular in rate and rhythm. GI: The  abdomen is soft, nontender, and nondistended. There were no palpable masses. I did not appreciate hepatosplenomegaly. There were normal bowel sounds.  PEG tube. GU: Foley catheter in place. MUSCULOSKELETAL:  Symmetrical muscle tone is not appreciated in any extremity.    SKIN: Skin turgor is normal.  With the help of the nursing staff were able to reposition the patient adequately so I could evaluate his sacral and ischial regions. There is a stage IV sacral decubitus, I can palpate sacral bone despite no involvement appreciated on  CT.  There was liquefactive necrosis on the inferior right lower quadrant of the established wound, on digital probing I was able to release a small amount of malodorous liquefactive tissue.  Upon further digital examination of his wound I found no other areas of necrosis, or undrained fluid collections.  Having drained this 1 area I reevaluated, and found nothing requiring excision at this time.  We then irrigated the sacral wound, and packed it with Kerlix gauze and covered it with an ABD. His left ischial area has a stage II decubitus, and his right has a first stage pressure changes.  NEUROLOGIC:  Motor and sensation not appreciated below neck.   Data Reviewed I have personally reviewed what is currently available of the patient's imaging, recent labs and medical records.   Labs:  CBC Latest Ref Rng & Units 04/28/2019 04/27/2019 07/04/2015  WBC 4.0 - 10.5 K/uL 11.7(H) 15.1(H) 11.7(H)  Hemoglobin 13.0 - 17.0 g/dL 10.2(L) 10.3(L) 13.3  Hematocrit 39.0 - 52.0 % 33.0(L) 33.0(L) 40.3  Platelets 150 - 400 K/uL 375 434(H) 244   CMP Latest Ref Rng & Units 04/28/2019 04/27/2019 07/04/2015  Glucose 70 - 99 mg/dL 181(H) 247(H) 116(H)  BUN 6 - 20 mg/dL 9 12 12   Creatinine 0.61 - 1.24 mg/dL 0.41(L) 0.42(L) 1.31(H)  Sodium 135 - 145 mmol/L 137 131(L) 134(L)  Potassium 3.5 - 5.1 mmol/L 3.8 3.5 3.6  Chloride 98 - 111 mmol/L 95(L) 88(L) 102  CO2 22 - 32 mmol/L 30 32 21(L)  Calcium  8.9 - 10.3 mg/dL 9.2 9.3 8.7(L)  Total Protein 6.5 - 8.1 g/dL - 7.8 8.5(H)  Total Bilirubin 0.3 - 1.2 mg/dL - 0.5 1.3(H)  Alkaline Phos 38 - 126 U/L - 70 54  AST 15 - 41 U/L - 37 25  ALT 0 - 44 U/L - 109(H) 31   CT EXAM: CT ABDOMEN AND PELVIS WITH CONTRAST  TECHNIQUE: Multidetector CT imaging of the abdomen and pelvis was performed using the standard protocol following bolus administration of intravenous contrast.  CONTRAST:  134mL OMNIPAQUE IOHEXOL 300 MG/ML  SOLN  COMPARISON:  None.  FINDINGS: Lower chest: Lung bases are clear.  Hepatobiliary: Liver is diffusely low in density indicating fatty infiltration. Hypodense masslike lesion involving the caudate lobe of the liver, difficult to definitively characterize due to patient body habitus.  Gallbladder appears normal. No bile duct dilatation seen.  Pancreas: Unremarkable. No pancreatic ductal dilatation or surrounding inflammatory changes.  Spleen: Normal in size without focal abnormality.  Adrenals/Urinary Tract: Adrenal glands appear normal. Kidneys are unremarkable without mass, stone or hydronephrosis. Bladder is decompressed by Foley catheter.  Stomach/Bowel: No dilated large or small bowel loops. No evidence of bowel wall inflammation seen.  Vascular/Lymphatic: No acute appearing vascular abnormality. No enlarged lymph nodes seen. IVC filter in place with tip below the level of the renal veins.  Reproductive: Prostate is unremarkable.  Other: Fluid stranding within the lower pelvis. No intraperitoneal fluid collection or abscess collection identified. No free intraperitoneal air.  Gastrostomy tube appears appropriately position.  Musculoskeletal: Poorly defined collection of fluid/inflammation and gas within the superficial soft tissues overlying the lower sacrum (just to the RIGHT of midline, measuring 5 x 2 cm (series 2, image 88). This inflammation and soft tissue gas extends to the  overlying skin in the midline, compatible with skin/wound breakdown. Associated soft tissue gas and soft tissue inflammation extends inferiorly below the level of the coccyx into the level of the medial aspects of the RIGHT gluteus musculature (  series 2, image 105). No evidence of intraperitoneal extension.  Underlying osseous structures appear intact and normal in mineralization. No evidence of underlying osteomyelitis.  IMPRESSION: 1. Poorly defined collection of fluid/inflammation and soft tissue gas within the superficial soft tissues overlying the lower sacrum (just to the RIGHT of midline), measuring 5 x 2 cm (series 2, image 88). No definite drainable abscess collection. This ill-defined soft tissue inflammation and soft tissue gas extends to the overlying skin in the midline at the level of the mid sacrum, compatible with skin/wound breakdown. Associated soft tissue gas and soft tissue inflammation extends inferiorly to the coccyx and to the level of the medial aspects of the RIGHT gluteus musculature (series 2, image 105). No evidence of intraperitoneal extension. No evidence of underlying osteomyelitis. 2. Fatty infiltration of the liver. 3. **An incidental finding of potential clinical significance has been found. Hypodense masslike lesion involving the caudate lobe of the liver, difficult to definitively characterize due to patient body habitus. Recommend nonemergent liver MRI for further characterization. When the patient is clinically stable and able to follow directions and hold their breath (preferably as an outpatient) further evaluation with dedicated abdominal MRI should be considered.**   Electronically Signed   By: Bary Richard M.D.   On: 04/27/2019 15:56    Assessment    Undrained abscess of stage IV sacral decubitus, now drained at bedside. Stage II left ischial decubitus Stage I right ischial decubitus Patient Active Problem List   Diagnosis  Date Noted  . Sepsis (HCC) 04/27/2019  . H/O insulin dependent diabetes mellitus 04/27/2019  . History of stroke 04/27/2019  . Seizure disorder (HCC) 04/27/2019  . Sacral decubitus ulcer, stage IV (HCC) 04/27/2019    Plan    As for now we will continue twice daily dressing changes with saline.  I believe he will benefit from wound VAC placement as there appears to be sufficient distance from his anal area.  We must continue vigilant offloading of all 3 of these areas utilizing available bariatric specialty mattresses/beds.  I concur with current IV antibiotic use and noted he already had diminishment in his white blood cell count since admission.  I am expecting that no further surgical intervention will be necessary in the immediate future.     Campbell Lerner M.D., FACS 04/28/2019, 9:16 AM

## 2019-04-28 NOTE — Progress Notes (Addendum)
Pharmacy Antibiotic Note  Paul Hall is a 24 y.o. male admitted on 04/27/2019 with sepsis.  Pharmacy has been consulted for Vancomycin, Cefepime  dosing.  Plan: Cefepime 2 gm IV Q8H.  Vancomycin 1 gm IV X 1 given on 1/23 @ 1547.  Additional Vancomycin 1500 mg IV X 1 given @ 1650 on 1/23to make total loading dose of 2500 mg. Will start vancomycin 1500 mg q8H. Pt is at risk of accumulating. Plan to order vancomycin level in the next 1-2 days. Predicted AUC 443. Css min 13. Goal AUC 400-550.    Height: 5\' 9"  (175.3 cm) Weight: (!) 362 lb (164.2 kg) IBW/kg (Calculated) : 70.7  Temp (24hrs), Avg:100 F (37.8 C), Min:98.6 F (37 C), Max:101.2 F (38.4 C)  Recent Labs  Lab 04/27/19 1147 04/27/19 1241 04/28/19 0521  WBC 15.1*  --  11.7*  CREATININE 0.42*  --  0.41*  LATICACIDVEN  --  1.5 1.1    Estimated Creatinine Clearance: 219.6 mL/min (A) (by C-G formula based on SCr of 0.41 mg/dL (L)).    Allergies  Allergen Reactions  . Morphine Hives    Patient has tolerated oxycodone at high amounts during Nov 2020 hospitalization  . Benzonatate Other (See Comments)    Antimicrobials this admission: 1/24 cefepime >>  1/24 vancomycin  >>   Microbiology results: 1/23 BCx: pending  1/23 UCx:  pending   Thank you for allowing pharmacy to be a part of this patient's care.  2/23, PharmD, BCPS 04/28/2019 1:41 PM

## 2019-04-28 NOTE — Progress Notes (Signed)
Progress Note    Paul Hall  TMH:[id number] DOB: 04/20/1995  DOA: 04/27/2019 PCP: Shawnie Dapper, PA-C      Brief Narrative:    Medical records reviewed and are as summarized below:  Paul Hall is an 24 y.o. 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  He was admitted to [facility] in [place] on 01/20/2019 and discharged to acute rehab on 03/15/2019.  According to his wife at the bedside, patient was discharged from the rehab facility a few weeks ago.    Patient is nonverbal and unable to provide any history so history was obtained from chart review and his wife at the bedside.  His wife said patient was brought to the emergency room at the recommendation of his PCP for evaluation of sacral decubitus ulcer.  She said she developed a sacral ulcer from his hospitalization at [facility].  She said the patient could not be turned while he was on ECMO and that led to the development of the ulcer.  She also said that there has been intermittent drainage from the wound.  She also said PCP was concerned about the tracheostomy and said that patient no longer required a tracheostomy wanted it to be removed.  She does not report any fever or vomiting.  His wife said that patient is nonverbal but sometimes he can move his extremities from side to side.      Assessment/Plan:   Principal Problem:   Sepsis (HCC) Active Problems:   H/O insulin dependent diabetes mellitus   History of stroke   Seizure  disorder (HCC)   Decubitus ulcer of sacral region, unstageable (HCC)   Body mass index is 53.46 kg/m.  (Morbid obesity)   Sepsis probably due to unstageable sacral wound infection/leukocytosis: Continue empiric IV antibiotics.   General surgeon has been consulted for evaluation of sacral decubitus wound.  No evidence of osteomyelitis on CT pelvis. Follow-up blood cultures.    Hypotension: BP has improved.  Hold amlodipine, chlorthalidone and Coreg  Insulin-dependent diabetes mellitus: Continue insulin and monitor glucose levels  History of stroke with quadriplegia: Continue aspirin  Seizure disorder: Continue Keppra  Dysphagia: Continue enteral nutrition.  Consulted dietitian to assist with nutrition  Urinary retention with abnormal urinalysis: Chronic indwelling Foley catheter in place.  Abnormal urinalysis is probably due to indwelling Foley catheter.  Chronic hypoxemic respiratory failure s/p tracheostomy: Patient's wife requested that tracheostomy be removed.  Patient has been evaluated by ENT physician who feels that may not be safe to remove tracheostomy at this time.  Continue oxygen via trach collar as needed  History of bilateral lower extremity DVT status post IVC filter placement: Continue Lovenox for DVT prophylaxis  Hyponatremia: Improved.   Family Communication/Anticipated D/C date and plan/Code Status   DVT prophylaxis: Lovenox Code Status: Full code Family Communication: None Disposition Plan: To be determined      Subjective:   Patient is nonverbal and unable to provide any history.  Overnight events noted.     Objective:    Vitals:   04/28/19 0301 04/28/19 0402 04/28/19 0608 04/28/19 0801  BP: 114/68 (!) 96/53 112/65 (!) 102/47  Pulse: 99  96 95 97  Resp: (!) 32 (!) 28 (!) 24 (!) 24  Temp: 99.4 F (37.4 C) 99.3 F (37.4 C) 99.5 F (37.5 C) 98.6 F (37 C)  TempSrc: Axillary Axillary Axillary Axillary  SpO2: 100% 99% 100% 100%    Weight:      Height:        Intake/Output Summary (Last 24 hours) at 04/28/2019 1347 Last data filed at 04/28/2019 0400 Gross per 24 hour  Intake 2790.98 ml  Output 500 ml  Net 2290.98 ml   Filed Weights   04/27/19 1844  Weight: (!) 164.2 kg    Exam:  GEN: NAD SKIN: sacral decubitus ulcer EYES: eyes shut and unable to examine ENT: MMM, +tracheostomy CV: RRR PULM: Diffuse gurgling sounds heard on auscultation ABD: soft, obese, NT, +BS, +PEG tube CNS: Nonverbal, quadriplegic EXT: No edema    Data Reviewed:   I have personally reviewed following labs and imaging studies:  Labs: Labs show the following:   Basic Metabolic Panel: Recent Labs  Lab 04/27/19 1147 04/28/19 0521  NA 131* 137  K 3.5 3.8  CL 88* 95*  CO2 32 30  GLUCOSE 247* 181*  BUN 12 9  CREATININE 0.42* 0.41*  CALCIUM 9.3 9.2   GFR Estimated Creatinine Clearance: 219.6 mL/min (A) (by C-G formula based on SCr of 0.41 mg/dL (L)). Liver Function Tests: Recent Labs  Lab 04/27/19 1147  AST 37  ALT 109*  ALKPHOS 70  BILITOT 0.5  PROT 7.8  ALBUMIN 2.6*   No results for input(s): LIPASE, AMYLASE in the last 168 hours. No results for input(s): AMMONIA in the last 168 hours. Coagulation profile Recent Labs  Lab 04/27/19 1241  INR 1.2    CBC: Recent Labs  Lab 04/27/19 1147 04/28/19 0521  WBC 15.1* 11.7*  NEUTROABS 10.1*  --   HGB 10.3* 10.2*  HCT 33.0* 33.0*  MCV 85.1 85.3  PLT 434* 375   Cardiac Enzymes: No results for input(s): CKTOTAL, CKMB, CKMBINDEX, TROPONINI in the last 168 hours. BNP (last 3 results) No results for input(s): PROBNP in the last 8760 hours. CBG: Recent Labs  Lab 04/27/19 2305 04/28/19 0800 04/28/19 1130  GLUCAP 188* 160* 156*   D-Dimer: No results for input(s): DDIMER in the last 72 hours. Hgb A1c: No results for input(s): HGBA1C in the last 72 hours. Lipid Profile: No results for input(s): CHOL, HDL, LDLCALC, TRIG, CHOLHDL, LDLDIRECT in the last  72 hours. Thyroid function studies: No results for input(s): TSH, T4TOTAL, T3FREE, THYROIDAB in the last 72 hours.  Invalid input(s): FREET3 Anemia work up: No results for input(s): VITAMINB12, FOLATE, FERRITIN, TIBC, IRON, RETICCTPCT in the last 72 hours. Sepsis Labs: Recent Labs  Lab 04/27/19 1147 04/27/19 1241 04/28/19 0521  WBC 15.1*  --  11.7*  LATICACIDVEN  --  1.5 1.1    Microbiology Recent Results (from the past 240 hour(s))  Blood Culture (routine x 2)     Status: None (Preliminary result)   Collection Time: 04/27/19 12:43 PM   Specimen: BLOOD  Result Value Ref Range Status   Specimen Description BLOOD LEFT ANTECUBITAL  Final   Special Requests   Final    BOTTLES DRAWN AEROBIC AND ANAEROBIC Blood Culture results may not be optimal due to an inadequate volume of blood received in culture bottles   Culture   Final    NO GROWTH < 24 HOURS Performed at [facility], [address], [place], [place] [address]    Report Status  PENDING  Incomplete  Blood Culture (routine x 2)     Status: None (Preliminary result)   Collection Time: 04/27/19 12:43 PM   Specimen: BLOOD  Result Value Ref Range Status   Specimen Description BLOOD BLOOD RIGHT FOREARM  Final   Special Requests   Final    BOTTLES DRAWN AEROBIC AND ANAEROBIC Blood Culture adequate volume   Culture   Final    NO GROWTH < 24 HOURS Performed at [facility], [address] [place], [place] [address]    Report Status PENDING  Incomplete  Respiratory Panel by RT PCR (Flu A&B, Covid) -     Status: None   Collection Time: 04/27/19 12:43 PM  Result Value Ref Range Status   SARS Coronavirus 2 by RT PCR NEGATIVE NEGATIVE Final    Comment: (NOTE) SARS-CoV-2 target nucleic acids are NOT DETECTED. The SARS-CoV-2 RNA is generally detectable in upper respiratoy specimens during the acute phase of infection. The lowest concentration of SARS-CoV-2 viral copies this assay can detect is 131 copies/mL.  A negative result does not preclude SARS-Cov-2 infection and should not be used as the sole basis for treatment or other patient management decisions. A negative result may occur with  improper specimen collection/handling, submission of specimen other than nasopharyngeal swab, presence of viral mutation(s) within the areas targeted by this assay, and inadequate number of viral copies (<131 copies/mL). A negative result must be combined with clinical observations, patient history, and epidemiological information. The expected result is Negative. Fact Sheet for Patients:  https://www.moore.com/https://www.fda.gov/media/142436/download Fact Sheet for Healthcare Providers:  https://www.young.biz/https://www.fda.gov/media/142435/download This test is not yet ap proved or cleared by the [place] FDA and  has been authorized for detection and/or diagnosis of SARS-CoV-2 by FDA under an Emergency Use Authorization (EUA). This EUA will remain  in effect (meaning this test can be used) for the duration of the COVID-19 declaration under Section 564(b)(1) of the Act, 21 U.S.C. section 360bbb-3(b)(1), unless the authorization is terminated or revoked sooner.    Influenza A by PCR NEGATIVE NEGATIVE Final   Influenza B by PCR NEGATIVE NEGATIVE Final    Comment: (NOTE) The Xpert Xpress SARS-CoV-2/FLU/RSV assay is intended as an aid in  the diagnosis of influenza from Nasopharyngeal swab specimens and  should not be used as a sole basis for treatment. Nasal washings and  aspirates are unacceptable for Xpert Xpress SARS-CoV-2/FLU/RSV  testing. Fact Sheet for Patients: https://www.moore.com/https://www.fda.gov/media/142436/download Fact Sheet for Healthcare Providers: https://www.young.biz/https://www.fda.gov/media/142435/download This test is not yet approved or cleared by the [place] FDA and  has been authorized for detection and/or diagnosis of SARS-CoV-2 by  FDA under an Emergency Use Authorization (EUA). This EUA will remain  in effect (meaning this test can be used)  for the duration of the  Covid-19 declaration under Section 564(b)(1) of the Act, 21  U.S.C. section 360bbb-3(b)(1), unless the authorization is  terminated or revoked. Performed at [facility], [address] [place], [place] [address]   C difficile quick scan w PCR reflex     Status: None   Collection Time: 04/27/19  1:10 PM   Specimen: Stool  Result Value Ref Range Status   C Diff antigen NEGATIVE NEGATIVE Final   C Diff toxin NEGATIVE NEGATIVE Final   C Diff interpretation No C. difficile detected.  Final    Comment: Performed at [facility], [address] [place], [place] [address]    Procedures and diagnostic studies:  CT ABDOMEN PELVIS W CONTRAST  Result  Date: 04/27/2019 CLINICAL DATA:  Acute abdominal pain. Evaluate for abscess. Bed-bound. EXAM: CT ABDOMEN AND PELVIS WITH CONTRAST TECHNIQUE: Multidetector CT imaging of the abdomen and pelvis was performed using the standard protocol following bolus administration of intravenous contrast. CONTRAST:  168mL OMNIPAQUE IOHEXOL 300 MG/ML  SOLN COMPARISON:  None. FINDINGS: Lower chest: Lung bases are clear. Hepatobiliary: Liver is diffusely low in density indicating fatty infiltration. Hypodense masslike lesion involving the caudate lobe of the liver, difficult to definitively characterize due to patient body habitus. Gallbladder appears normal. No bile duct dilatation seen. Pancreas: Unremarkable. No pancreatic ductal dilatation or surrounding inflammatory changes. Spleen: Normal in size without focal abnormality. Adrenals/Urinary Tract: Adrenal glands appear normal. Kidneys are unremarkable without mass, stone or hydronephrosis. Bladder is decompressed by Foley catheter. Stomach/Bowel: No dilated large or small bowel loops. No evidence of bowel wall inflammation seen. Vascular/Lymphatic: No acute appearing vascular abnormality. No enlarged lymph nodes seen. IVC filter in place with tip below the level of the renal  veins. Reproductive: Prostate is unremarkable. Other: Fluid stranding within the lower pelvis. No intraperitoneal fluid collection or abscess collection identified. No free intraperitoneal air. Gastrostomy tube appears appropriately position. Musculoskeletal: Poorly defined collection of fluid/inflammation and gas within the superficial soft tissues overlying the lower sacrum (just to the RIGHT of midline, measuring 5 x 2 cm (series 2, image 88). This inflammation and soft tissue gas extends to the overlying skin in the midline, compatible with skin/wound breakdown. Associated soft tissue gas and soft tissue inflammation extends inferiorly below the level of the coccyx into the level of the medial aspects of the RIGHT gluteus musculature (series 2, image 105). No evidence of intraperitoneal extension. Underlying osseous structures appear intact and normal in mineralization. No evidence of underlying osteomyelitis. IMPRESSION: 1. Poorly defined collection of fluid/inflammation and soft tissue gas within the superficial soft tissues overlying the lower sacrum (just to the RIGHT of midline), measuring 5 x 2 cm (series 2, image 88). No definite drainable abscess collection. This ill-defined soft tissue inflammation and soft tissue gas extends to the overlying skin in the midline at the level of the mid sacrum, compatible with skin/wound breakdown. Associated soft tissue gas and soft tissue inflammation extends inferiorly to the coccyx and to the level of the medial aspects of the RIGHT gluteus musculature (series 2, image 105). No evidence of intraperitoneal extension. No evidence of underlying osteomyelitis. 2. Fatty infiltration of the liver. 3. **An incidental finding of potential clinical significance has been found. Hypodense masslike lesion involving the caudate lobe of the liver, difficult to definitively characterize due to patient body habitus. Recommend nonemergent liver MRI for further characterization. When  the patient is clinically stable and able to follow directions and hold their breath (preferably as an outpatient) further evaluation with dedicated abdominal MRI should be considered.** Electronically Signed   By: Franki Cabot M.D.   On: 04/27/2019 15:56   DG Chest Port 1 View  Result Date: 04/27/2019 CLINICAL DATA:  Sepsis. EXAM: PORTABLE CHEST 1 VIEW COMPARISON:  Single-view of the chest 03/16/2019 and 01/20/2019. FINDINGS: Tracheostomy tube is unchanged. Lungs are clear. Heart size is normal. No pneumothorax or pleural fluid. No acute or focal bony abnormality. IMPRESSION: No acute disease. Electronically Signed   By: Inge Rise M.D.   On: 04/27/2019 13:19    Medications:   . aspirin EC  81 mg Oral Daily  . Chlorhexidine Gluconate Cloth  6 each Topical Daily  . enoxaparin (LOVENOX) injection  40 mg Subcutaneous Q12H  .  famotidine  20 mg Oral BID  . insulin aspart  0-20 Units Subcutaneous TID WC  . levETIRAcetam  1,500 mg Oral BID  . magnesium oxide  800 mg Per Tube Daily  . mouth rinse  15 mL Mouth Rinse q12n4p  . pneumococcal 23 valent vaccine  0.5 mL Intramuscular Tomorrow-1000   Continuous Infusions: . sodium chloride 250 mL (04/28/19 0630)  . ceFEPime (MAXIPIME) IV 2 g (04/28/19 0631)  . feeding supplement (OSMOLITE 1.2 CAL)       LOS: 1 day   Carnel Stegman  [facility]   *Please refer to [email or web address], password [email or web address] to get updated schedule on who will round on this patient, as hospitalists switch teams weekly. If 7PM-7AM, please contact night-coverage at [email or web address], password [email or web address] for any overnight needs.  04/28/2019, 1:47 PM

## 2019-04-28 NOTE — Progress Notes (Signed)
Sx. Pt. For moderate amt. Of thick tan white secretions.Pt. tolerated procedure well.

## 2019-04-28 NOTE — Progress Notes (Signed)
Brief Nutrition Note  RD working remotely.  Consult received for enteral/tube feeding initiation and management.  Adult Enteral Nutrition Protocol initiated. Full assessment to follow.  Admitting Dx: Sepsis (HCC) [A41.9] Skin ulcer of sacrum with necrosis of muscle (HCC) [L98.423] Sepsis, due to unspecified organism, unspecified whether acute organ dysfunction present (HCC) [A41.9]  Body mass index is 53.46 kg/m. Pt meets criteria for extreme obesity, class III based on current BMI.  Labs:  Recent Labs  Lab 04/27/19 1147 04/28/19 0521  NA 131* 137  K 3.5 3.8  CL 88* 95*  CO2 32 30  BUN 12 9  CREATININE 0.42* 0.41*  CALCIUM 9.3 9.2  GLUCOSE 247* 181*    Bowden Boody A. Mayford Knife, RD, LDN, CDCES Registered Dietitian II Certified Diabetes Care and Education Specialist Pager: [phone] After hours Pager: [phone]

## 2019-04-28 NOTE — Consult Note (Signed)
[place], Phifer [id number] 1995-08-29 Jennye Boroughs, MD  Reason for Consult: evaluate for decannulation  HPI: Asked to evaluate patient for candidate for tracheostomy decannulation.  24 year old morbidly obese male admitted for sepsis secondary to sacral ulcer.  Was admitted in 01/2019 with respiratory distress secondary to COVID infection requiring ECMO.  Underwent tracheostomy for failure to wean from vent.  Patient has also suffered CVA and subdural hematoma, and now has functional quadriplegia per records.  Per Wife PCP says that patient can be decannulated.  Patient is unresponsive and cannot report any current ROS.  Allergies:  Allergies  Allergen Reactions  . Morphine Hives    Patient has tolerated oxycodone at high amounts during Nov 2020 hospitalization  . Benzonatate Other (See Comments)    ROS: Review of systems normal other than 12 systems except per HPI.  PMH:  Past Medical History:  Diagnosis Date  . Diabetes mellitus ([place])   . Hypertension   . Stroke [facility])     FH: History reviewed. No pertinent family history.  SH:  Social History   Socioeconomic History  . Marital status: Married    Spouse name: Not on file  . Number of children: Not on file  . Years of education: Not on file  . Highest education level: Not on file  Occupational History  . Not on file  Tobacco Use  . Smoking status: Never Smoker  . Smokeless tobacco: Never Used  Substance and Sexual Activity  . Alcohol use: No  . Drug use: No  . Sexual activity: Not on file  Other Topics Concern  . Not on file  Social History Narrative  . Not on file   Social Determinants of Health   Financial Resource Strain:   . Difficulty of Paying Living Expenses: Not on file  Food Insecurity:   . Worried About Charity fundraiser in the Last Year: Not on file  . Ran Out of Food in the Last Year: Not on file  Transportation Needs:   . Lack of Transportation (Medical): Not on file  . Lack of  Transportation (Non-Medical): Not on file  Physical Activity:   . Days of Exercise per Week: Not on file  . Minutes of Exercise per Session: Not on file  Stress:   . Feeling of Stress : Not on file  Social Connections:   . Frequency of Communication with Friends and Family: Not on file  . Frequency of Social Gatherings with Friends and Family: Not on file  . Attends Religious Services: Not on file  . Active Member of Clubs or Organizations: Not on file  . Attends Archivist Meetings: Not on file  . Marital Status: Not on file  Intimate Partner Violence:   . Fear of Current or Ex-Partner: Not on file  . Emotionally Abused: Not on file  . Physically Abused: Not on file  . Sexually Abused: Not on file    PSH:  Past Surgical History:  Procedure Laterality Date  . IVC FILTER PLACEMENT ([facility] HX)    . LEG SURGERY    . PEG TUBE PLACEMENT    . TRACHEOSTOMY      Physical  Exam:  GEN-  Morbidly obese male supine in bed getting tube feeds NEURO-  Opens eyes but no volitional movement on exam for me EARS-  Ears clear bilaterally NOSE-  Clear anteriorly OC/OP-  Limited exam due to lack of cooperation but no masses or lesions seen NECK-  Size 8 cuffless  fenestrated shiley in place and patent with mild clear secretions.  Tracheostoma well healed.  No landmarks palpated due to patient's body habitus RESP-  Unlabored with trach collar in place CARD- RRR   A/P: Morbidly obese, quadriplegic, h/o CVA and ICH 24 y.o. male admitted for sepsis with history of respiratory failure following COVID infection requiring tracheostomy  Plan:  Patient is not a candidate for decannulation at this time.  Attempted to call wife and mother today to discuss but no answer at either number listed.  Recommend discussion with PCP as to why they feel patient is candidate for decannulation at this time.  He is unable to alert anyone to respiratory distress, quadriplegic and not even a candidate for [facility] valve due to inability to remove, as well as morbidly obese with most likely undiagnosed severe OSA.  If family wishes to aggressively pursue decannulation despite these risks, evaluation by pulmonology as well as monitored sleep study with trach capped would be needed prior to medical approval for decannulation.  He would also need at least 24 hour monitoring with pulse oximetry with trach capped.  Family would also have to understand that given patient's body habitus and scar from previous trach, if patient went into respiratory distress after removal, it may be very difficult to emergently place new airway.  I would certainly not recommend it while patient is currently admitted with sepsis.   Roney Mans Allissa Albright 04/28/2019 10:27 AM

## 2019-04-29 ENCOUNTER — Inpatient Hospital Stay: Payer: BC Managed Care – PPO

## 2019-04-29 LAB — CBC WITH DIFFERENTIAL/PLATELET
Abs Immature Granulocytes: 0.05 10*3/uL (ref 0.00–0.07)
Basophils Absolute: 0 10*3/uL (ref 0.0–0.1)
Basophils Relative: 0 %
Eosinophils Absolute: 0.4 10*3/uL (ref 0.0–0.5)
Eosinophils Relative: 4 %
HCT: 33.6 % — ABNORMAL LOW (ref 39.0–52.0)
Hemoglobin: 10.4 g/dL — ABNORMAL LOW (ref 13.0–17.0)
Immature Granulocytes: 1 %
Lymphocytes Relative: 23 %
Lymphs Abs: 2.4 10*3/uL (ref 0.7–4.0)
MCH: 26.5 pg (ref 26.0–34.0)
MCHC: 31 g/dL (ref 30.0–36.0)
MCV: 85.5 fL (ref 80.0–100.0)
Monocytes Absolute: 0.8 10*3/uL (ref 0.1–1.0)
Monocytes Relative: 7 %
Neutro Abs: 6.7 10*3/uL (ref 1.7–7.7)
Neutrophils Relative %: 65 %
Platelets: 397 10*3/uL (ref 150–400)
RBC: 3.93 MIL/uL — ABNORMAL LOW (ref 4.22–5.81)
RDW: 13.9 % (ref 11.5–15.5)
WBC: 10.3 10*3/uL (ref 4.0–10.5)
nRBC: 0 % (ref 0.0–0.2)

## 2019-04-29 LAB — GLUCOSE, CAPILLARY
Glucose-Capillary: 177 mg/dL — ABNORMAL HIGH (ref 70–99)
Glucose-Capillary: 138 mg/dL — ABNORMAL HIGH (ref 70–99)
Glucose-Capillary: 146 mg/dL — ABNORMAL HIGH (ref 70–99)
Glucose-Capillary: 142 mg/dL — ABNORMAL HIGH (ref 70–99)
Glucose-Capillary: 137 mg/dL — ABNORMAL HIGH (ref 70–99)
Glucose-Capillary: 165 mg/dL — ABNORMAL HIGH (ref 70–99)

## 2019-04-29 LAB — BASIC METABOLIC PANEL
Anion gap: 11 (ref 5–15)
BUN: 6 mg/dL (ref 6–20)
CO2: 29 mmol/L (ref 22–32)
Calcium: 9.4 mg/dL (ref 8.9–10.3)
Chloride: 97 mmol/L — ABNORMAL LOW (ref 98–111)
Creatinine, Ser: 0.36 mg/dL — ABNORMAL LOW (ref 0.61–1.24)
GFR calc Af Amer: >60 mL/min (ref >60)
GFR calc non Af Amer: >60 mL/min (ref >60)
Glucose, Bld: 144 mg/dL — ABNORMAL HIGH (ref 70–99)
Potassium: 3.1 mmol/L — ABNORMAL LOW (ref 3.5–5.1)
Sodium: 137 mmol/L (ref 135–145)

## 2019-04-29 LAB — URINE CULTURE

## 2019-04-29 LAB — MAGNESIUM: Magnesium: 1.9 mg/dL (ref 1.7–2.4)

## 2019-04-29 LAB — MRSA PCR SCREENING: MRSA by PCR: NEGATIVE

## 2019-04-29 MED ORDER — GLUCERNA 1.5 CAL PO LIQD
1000.0000 mL | ORAL | Status: DC
  Administered 2019-04-29 – 2019-05-10 (×10): 1000 mL

## 2019-04-29 MED ORDER — POTASSIUM CHLORIDE 20 MEQ PO PACK: 40.0000 meq | PACK | Freq: Two times a day (BID) | ORAL | Status: DC

## 2019-04-29 MED ORDER — FREE WATER
100.0000 mL | Status: DC
  Administered 2019-04-29 – 2019-05-11 (×71): 100 mL

## 2019-04-29 MED ORDER — POTASSIUM CHLORIDE 20 MEQ PO PACK
40.0000 meq | PACK | Freq: Two times a day (BID) | ORAL | Status: AC
  Administered 2019-04-29 (×2): 40 meq
  Filled 2019-04-29 (×2): qty 2

## 2019-04-29 NOTE — Progress Notes (Signed)
Notified by nursing staff earlier in shift that patient's mews was 6 due to respirations in the 30's. RRT notified. Nurse Instructed to suction patient to help with breathing. Respirations decreased to about 28. Patient is stable and oxygen is within normal limits. Patient is not in distress. Spoke with wife. Wife explained that he has history of sleep apnea and breathing issues but no distress.  Patient does not appear to be in any pain and is resting. Mews did go down to a 5 and has been between 5 and 6. Will continue to monitor to end of shift.

## 2019-04-29 NOTE — Progress Notes (Signed)
Tube feeding turned off at midnight

## 2019-04-29 NOTE — Progress Notes (Signed)
Ch received call from wife of Pt inquiring if a POA can be signed tomorrow. When Ch asked if Pt is in a position to sign, she said that he is not responsive at the moment. Ch informed Pt's wife, that the POA could not be completed without the Pt's signature and consent. Pt's wife accepted the information and ended the call.   04/29/19 1559  Clinical Encounter Type  Visited With Family  Visit Type Initial  Referral From Family  Consult/Referral To Chaplain

## 2019-04-29 NOTE — Progress Notes (Signed)
[facility] SURGICAL PROGRESS NOTE (cpt [phone])  Hospital Day(s): 2.   Interval History:  Patient seen and examined no acute events or new complaints overnight.  WOC RN following; placed wound vac today Leukocytosis resolved Hypokalemia  Review of Systems:  Unable to preform secondary to non-verbal status  Vital signs in last 24 hours: [min-max] current  Temp:  [98.6 F (37 C)-100.2 F (37.9 C)] 100.2 F (37.9 C) (01/25 0502) Pulse Rate:  [97-113] 98 (01/25 0700) Resp:  [20-36] 26 (01/25 0502) BP: (99-140)/(42-84) 130/65 (01/25 0700) SpO2:  [98 %-100 %] 100 % (01/25 0600)     Height: 5\' 9"  (175.3 cm) Weight: (!) 164.2 kg BMI (Calculated): 53.43   Intake/Output last 2 shifts:  01/24 0701 - 01/25 0700 In: 1005.3 [I.V.:13.4; NG/GT:492; IV Piggyback:499.9] Out: 1675 [Urine:1675]   Physical Exam:  Constitutional: alert, no distress  HENT: normocephalic without obvious abnormality, tracheostomy present  Eyes: PERRL, EOM's grossly intact and symmetric  Respiratory: breathing non-labored at rest with tracheostomy Integumentary: Wound vac placed to sacrum and bridged to trochanter, good seal   Labs:  CBC Latest Ref Rng & Units 04/29/2019 04/28/2019 04/27/2019  WBC 4.0 - 10.5 K/uL 10.3 11.7(H) 15.1(H)  Hemoglobin 13.0 - 17.0 g/dL 10.4(L) 10.2(L) 10.3(L)  Hematocrit 39.0 - 52.0 % 33.6(L) 33.0(L) 33.0(L)  Platelets 150 - 400 K/uL 397 375 434(H)   CMP Latest Ref Rng & Units 04/29/2019 04/28/2019 04/27/2019  Glucose 70 - 99 mg/dL 04/29/2019) 563(O) 756(E)  BUN 6 - 20 mg/dL 6 9 12   Creatinine 0.61 - 1.24 mg/dL 332(R) ) 5.18(A)  Sodium 135 - 145 mmol/L 137 137 131(L)  Potassium 3.5 - 5.1 mmol/L 3.1(L) 3.8 3.5  Chloride 98 - 111 mmol/L 97(L) 95(L) 88(L)  CO2 22 - 32 mmol/L 29 30 32  Calcium 8.9 - 10.3 mg/dL 9.4 9.2 9.3  Total Protein 6.5 - 8.1 g/dL - - 7.8  Total Bilirubin 0.3 - 1.2 mg/dL - - 0.5  Alkaline Phos 38 - 126 U/L - - 70  AST 15 - 41 U/L - - 37  ALT 0 -  44 U/L - - 109(H)    Imaging studies: No new pertinent imaging studies   Assessment/Plan: (ICD-10's: L89.150) 24 y.o. male with stage IV sacral decubitus ulceration, stage II left ischial decubitus ulceration, and Stage 1 Pressure injury to the right ischium.    - Appreciate wound vac placement by WOC RN; continue wound care per their recommendations  - Change wound vac MWF  - Nothing further from surgery standpoint  - further management per primary service    - General surgery will sign off; please call with questions/concerns   All of the above findings and recommendations were discussed with the patient's family, and the medical team, and all of their questions were answered to their expressed satisfaction.  -- 0.63(K, PA-C [facility] 04/29/2019, 7:24 AM [phone] M-F: 7am - 4pm

## 2019-04-29 NOTE — Progress Notes (Signed)
Notified on-call Provider of patient status where he continues to be mew 6 and axillary 101.6 (without the pt added)100.6. Administered tylenol per PRN order.  Respirations -30. Patient is currently resting and when assessing does not appear to be in distress. Orders given to continue to monitor patient. Will continue to monitor to end of shift.

## 2019-04-29 NOTE — Consult Note (Signed)
WOC Nurse Consult Note: Reason for Consult:Stage 4 pressure injury to sacrum.  Apply NPWT.  Surgery has found no need for surgical intervention to wound.  Wound is 95% beefy red nongranulating tissue.  Requires 3 staff people to turn and assist with dressing change. Have ordered bariatric Big Turn mattress with low air loss feature.  Have implemented prevalon boots for offloading heel pressure and promote foot alignment.   Wound type:stage 4 pressure injury Pressure Injury POA: Yes Measurement: 9 cm x 6 cm x 6 cm.  Undermining extends 4 cm from 9 o'clock to 6 o'clock.  Wound bed:95% beefy red nongranulating.  Friable tissue. Drainage (amount, consistency, odor) moderate serosanguinous  No odor.  Periwound:scarring without return of melanin and some medical adhesive related skin injury at 3 o'clock.  Protected with Skin prep.  Dressing procedure/placement/frequency:Cleanse sacral wound with Ns and pat dry.  Fill wound depth with black foam and bridge to trochanter.  Protect intact skin with drape. Cover with drape.  Seal immediately achieved at 125. Will change M/W/F  WOC team will follow.   Maple Hudson MSN, RN, FNP-BC CWON Wound, Ostomy, Continence Nurse Pager [phone]

## 2019-04-29 NOTE — Plan of Care (Signed)
  Problem: Education: Goal: Knowledge of General Education information will improve Description: Including pain rating scale, medication(s)/side effects and non-pharmacologic comfort measures Outcome: Progressing   Problem: Elimination: Goal: Will not experience complications related to bowel motility Outcome: Progressing   Problem: Pain Managment: Goal: General experience of comfort will improve Outcome: Progressing   Problem: Safety: Goal: Ability to remain free from injury will improve Outcome: Progressing   Problem: Skin Integrity: Goal: Risk for impaired skin integrity will decrease Outcome: Progressing   Problem: Fluid Volume: Goal: Hemodynamic stability will improve Outcome: Progressing   Problem: Clinical Measurements: Goal: Diagnostic test results will improve Outcome: Progressing Goal: Signs and symptoms of infection will decrease Outcome: Progressing   Problem: Respiratory: Goal: Ability to maintain adequate ventilation will improve Outcome: Progressing

## 2019-04-29 NOTE — Progress Notes (Addendum)
Progress Note    Paul Hall  ZOX:[id number] DOB: 02/19/1996  DOA: 04/27/2019 PCP: Shawnie DapperMann, Benjamin L, PA-C      Brief Narrative:    Medical records reviewed and are as summarized below:  Paul Hall is an 24 y.o. 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  He was admitted to [facility] in [place] on 01/20/2019 and discharged to acute rehab on 03/15/2019.  According to his wife at the bedside, patient was discharged from the rehab facility a few weeks ago.    Patient is nonverbal and unable to provide any history so history was obtained from chart review and his wife at the bedside.  His wife said patient was brought to the emergency room at the recommendation of his PCP for evaluation of sacral decubitus ulcer.  She said she developed a sacral ulcer from his hospitalization at [facility].  She said the patient could not be turned while he was on ECMO and that led to the development of the ulcer.  She also said that there has been intermittent drainage from the wound.  She also said PCP was concerned about the tracheostomy and said that patient no longer required a tracheostomy wanted it to be removed.  She does not report any fever or vomiting.  His wife said that patient is nonverbal but sometimes he can move his extremities from side to side.      Assessment/Plan:   Principal Problem:   Sepsis (HCC) Active Problems:   H/O insulin dependent diabetes mellitus   History of stroke   Seizure  disorder (HCC)   Decubitus ulcer of sacral region, unstageable (HCC)   Body mass index is 53.46 kg/m.  (Morbid obesity)   Sepsis due to unstageable sacral wound infection/leukocytosis: Leukocytosis has improved.  Continue empiric IV antibiotics.   General surgeon recommended wound VAC therapy.  No surgery was recommended.  Surgeon has signed off.  No evidence of osteomyelitis on CT pelvis.   No growth on blood cultures thus far.  Patient is still tachycardic and tachypneic so he will be transferred to the stepdown unit for close monitoring.  Hypotension: BP has improved but hold antihypertensives for now.  Hold amlodipine, chlorthalidone and Coreg  Hypokalemia: Replete potassium  Insulin-dependent diabetes mellitus: Continue insulin and monitor glucose levels  History of stroke with quadriplegia: Continue aspirin  Seizure disorder: Continue Keppra  Dysphagia: Continue enteral nutrition.  Consulted dietitian to assist with nutrition  Urinary retention with abnormal urinalysis: Chronic indwelling Foley catheter in place.  Abnormal urinalysis is probably due to indwelling Foley catheter.  Acute on chronic hypoxemic respiratory failure s/p tracheostomy: He is on 5 L/min oxygen via trach collar.  Repeat chest x-ray done today did not show any acute abnormality.  History of bilateral lower extremity DVT status post IVC filter placement: Continue Lovenox for DVT prophylaxis  Hyponatremia: Improved.   Family Communication/Anticipated D/C date and plan/Code Status   DVT prophylaxis: Lovenox Code Status: Full code Family Communication: Plan discussed with his wife at the bedside Disposition Plan: To be determined      Subjective:   Patient is nonverbal and unable to provide any history.  Overnight events noted.  Patient had low-grade fever, tachypnea and tachycardia.   Objective:    Vitals:   04/29/19 0502 04/29/19 0600 04/29/19 0700 04/29/19 1054  BP: 134/76 137/84  130/65 (!) 112/58  Pulse: (!) 104 (!) 104 98 (!) 102  Resp: (!) 26   (!) 26  Temp: 100.2 F (37.9 C)   98.9 F (37.2 C)  TempSrc: Axillary   Axillary  SpO2: 100% 100%  100%  Weight:      Height:        Intake/Output Summary (Last 24 hours) at 04/29/2019 1336 Last data filed at 04/29/2019 0973 Gross per 24 hour  Intake 1005.26 ml  Output 1675 ml  Net -669.74 ml   Filed Weights   04/27/19 1844  Weight: (!) 164.2 kg    Exam:  GEN: NAD SKIN: sacral decubitus ulcer EYES: no pallor or icterus ENT: MMM, +tracheostomy CV: RRR PULM: no wheezing or rales heard ABD: soft, obese, NT, +BS, +PEG tube CNS: Nonverbal, quadriplegic EXT: No edema    Data Reviewed:   I have personally reviewed following labs and imaging studies:  Labs: Labs show the following:   Basic Metabolic Panel: Recent Labs  Lab 04/27/19 1147 04/27/19 1147 04/28/19 0521 04/29/19 0601 04/29/19 0620  NA 131*  --  137 137  --   K 3.5   < > 3.8 3.1*  --   CL 88*  --  95* 97*  --   CO2 32  --  30 29  --   GLUCOSE 247*  --  181* 144*  --   BUN 12  --  9 6  --   CREATININE 0.42*  --  0.41* 0.36*  --   CALCIUM 9.3  --  9.2 9.4  --   MG  --   --   --   --  1.9   < > = values in this interval not displayed.   GFR Estimated Creatinine Clearance: 219.6 mL/min (A) (by C-G formula based on SCr of 0.36 mg/dL (L)). Liver Function Tests: Recent Labs  Lab 04/27/19 1147  AST 37  ALT 109*  ALKPHOS 70  BILITOT 0.5  PROT 7.8  ALBUMIN 2.6*   No results for input(s): LIPASE, AMYLASE in the last 168 hours. No results for input(s): AMMONIA in the last 168 hours. Coagulation profile Recent Labs  Lab 04/27/19 1241  INR 1.2    CBC: Recent Labs  Lab 04/27/19 1147 04/28/19 0521 04/29/19 0601  WBC 15.1* 11.7* 10.3  NEUTROABS 10.1*  --  6.7  HGB 10.3* 10.2* 10.4*  HCT 33.0* 33.0* 33.6*  MCV 85.1 85.3 85.5  PLT 434* 375 397   Cardiac Enzymes: No results for input(s): CKTOTAL, CKMB, CKMBINDEX,  TROPONINI in the last 168 hours. BNP (last 3 results) No results for input(s): PROBNP in the last 8760 hours. CBG: Recent Labs  Lab 04/28/19 1130 04/28/19 2134 04/29/19 0131 04/29/19 0748 04/29/19 1136  GLUCAP 156* 168* 177* 138* 146*   D-Dimer: No results for input(s): DDIMER in the last 72 hours. Hgb A1c: No results for input(s): HGBA1C in the last 72 hours. Lipid Profile: No results for input(s): CHOL, HDL, LDLCALC, TRIG, CHOLHDL, LDLDIRECT in the last 72 hours. Thyroid function studies: No results for input(s): TSH, T4TOTAL, T3FREE, THYROIDAB in the last 72 hours.  Invalid input(s): FREET3 Anemia work up: No results for input(s): VITAMINB12, FOLATE, FERRITIN, TIBC, IRON, RETICCTPCT in the last 72 hours. Sepsis Labs: Recent Labs  Lab 04/27/19 1147 04/27/19  1241 04/28/19 0521 04/29/19 0601  WBC 15.1*  --  11.7* 10.3  LATICACIDVEN  --  1.5 1.1  --     Microbiology Recent Results (from the past 240 hour(s))  Blood Culture (routine x 2)     Status: None (Preliminary result)   Collection Time: 04/27/19 12:43 PM   Specimen: BLOOD  Result Value Ref Range Status   Specimen Description BLOOD LEFT ANTECUBITAL  Final   Special Requests   Final    BOTTLES DRAWN AEROBIC AND ANAEROBIC Blood Culture results may not be optimal due to an inadequate volume of blood received in culture bottles   Culture   Final    NO GROWTH 2 DAYS Performed at [facility], [address] [place], [place] [address]    Report Status PENDING  Incomplete  Blood Culture (routine x 2)     Status: None (Preliminary result)   Collection Time: 04/27/19 12:43 PM   Specimen: BLOOD  Result Value Ref Range Status   Specimen Description BLOOD BLOOD RIGHT FOREARM  Final   Special Requests   Final    BOTTLES DRAWN AEROBIC AND ANAEROBIC Blood Culture adequate volume   Culture  Setup Time   Final    AEROBIC BOTTLE ONLY GRAM POSITIVE RODS CRITICAL RESULT CALLED TO, READ BACK BY AND VERIFIED WITH:  SCOTT HALL ON 04/28/19 AT 2[address] [facility] Performed at [facility], [address] [place], [place] [address]    Culture GRAM POSITIVE RODS  Final   Report Status PENDING  Incomplete  Urine culture     Status: Abnormal   Collection Time: 04/27/19 12:43 PM   Specimen: In/Out Cath Urine  Result Value Ref Range Status   Specimen Description   Final    IN/OUT CATH URINE Performed at [facility], [address] [place], [place] [address]    Special Requests   Final    NONE Performed at [facility], [address] [place], [place] [address]    Culture MULTIPLE SPECIES PRESENT, SUGGEST RECOLLECTION (A)  Final   Report Status 04/29/2019 FINAL  Final  Respiratory Panel by RT PCR (Flu A&B, Covid) -     Status: None   Collection Time: 04/27/19 12:43 PM  Result Value Ref Range Status   SARS Coronavirus 2 by RT PCR NEGATIVE NEGATIVE Final    Comment: (NOTE) SARS-CoV-2 target nucleic acids are NOT DETECTED. The SARS-CoV-2 RNA is generally detectable in upper respiratoy specimens during the acute phase of infection. The lowest concentration of SARS-CoV-2 viral copies this assay can detect is 131 copies/mL. A negative result does not preclude SARS-Cov-2 infection and should not be used as the sole basis for treatment or other patient management decisions. A negative result may occur with  improper specimen collection/handling, submission of specimen other than nasopharyngeal swab, presence of viral mutation(s) within the areas targeted by this assay, and inadequate number of viral copies (<131 copies/mL). A negative result must be combined with clinical observations, patient history, and epidemiological information. The expected result is Negative. Fact Sheet for Patients:  https://www.moore.com/ Fact Sheet for Healthcare Providers:  https://www.young.biz/ This test is not yet ap proved or cleared by the [place] FDA and    has been authorized for detection and/or diagnosis of SARS-CoV-2 by FDA under an Emergency Use Authorization (EUA). This EUA will remain  in effect (meaning this test can be used) for the duration of the COVID-19 declaration under Section 564(b)(1) of the Act, 21 U.S.C. section 360bbb-3(b)(1),  unless the authorization is terminated or revoked sooner.    Influenza A by PCR NEGATIVE NEGATIVE Final   Influenza B by PCR NEGATIVE NEGATIVE Final    Comment: (NOTE) The Xpert Xpress SARS-CoV-2/FLU/RSV assay is intended as an aid in  the diagnosis of influenza from Nasopharyngeal swab specimens and  should not be used as a sole basis for treatment. Nasal washings and  aspirates are unacceptable for Xpert Xpress SARS-CoV-2/FLU/RSV  testing. Fact Sheet for Patients: PinkCheek.be Fact Sheet for Healthcare Providers: GravelBags.it This test is not yet approved or cleared by the [place] FDA and  has been authorized for detection and/or diagnosis of SARS-CoV-2 by  FDA under an Emergency Use Authorization (EUA). This EUA will remain  in effect (meaning this test can be used) for the duration of the  Covid-19 declaration under Section 564(b)(1) of the Act, 21  U.S.C. section 360bbb-3(b)(1), unless the authorization is  terminated or revoked. Performed at [facility], [address] [place], [place] [address]   C difficile quick scan w PCR reflex     Status: None   Collection Time: 04/27/19  1:10 PM   Specimen: Stool  Result Value Ref Range Status   C Diff antigen NEGATIVE NEGATIVE Final   C Diff toxin NEGATIVE NEGATIVE Final   C Diff interpretation No C. difficile detected.  Final    Comment: Performed at [facility], [address] [place], [place] [address]    Procedures and diagnostic studies:  CT ABDOMEN PELVIS W CONTRAST  Result Date: 04/27/2019 CLINICAL DATA:  Acute abdominal pain. Evaluate for  abscess. Bed-bound. EXAM: CT ABDOMEN AND PELVIS WITH CONTRAST TECHNIQUE: Multidetector CT imaging of the abdomen and pelvis was performed using the standard protocol following bolus administration of intravenous contrast. CONTRAST:  114mL OMNIPAQUE IOHEXOL 300 MG/ML  SOLN COMPARISON:  None. FINDINGS: Lower chest: Lung bases are clear. Hepatobiliary: Liver is diffusely low in density indicating fatty infiltration. Hypodense masslike lesion involving the caudate lobe of the liver, difficult to definitively characterize due to patient body habitus. Gallbladder appears normal. No bile duct dilatation seen. Pancreas: Unremarkable. No pancreatic ductal dilatation or surrounding inflammatory changes. Spleen: Normal in size without focal abnormality. Adrenals/Urinary Tract: Adrenal glands appear normal. Kidneys are unremarkable without mass, stone or hydronephrosis. Bladder is decompressed by Foley catheter. Stomach/Bowel: No dilated large or small bowel loops. No evidence of bowel wall inflammation seen. Vascular/Lymphatic: No acute appearing vascular abnormality. No enlarged lymph nodes seen. IVC filter in place with tip below the level of the renal veins. Reproductive: Prostate is unremarkable. Other: Fluid stranding within the lower pelvis. No intraperitoneal fluid collection or abscess collection identified. No free intraperitoneal air. Gastrostomy tube appears appropriately position. Musculoskeletal: Poorly defined collection of fluid/inflammation and gas within the superficial soft tissues overlying the lower sacrum (just to the RIGHT of midline, measuring 5 x 2 cm (series 2, image 88). This inflammation and soft tissue gas extends to the overlying skin in the midline, compatible with skin/wound breakdown. Associated soft tissue gas and soft tissue inflammation extends inferiorly below the level of the coccyx into the level of the medial aspects of the RIGHT gluteus musculature (series 2, image 105). No evidence of  intraperitoneal extension. Underlying osseous structures appear intact and normal in mineralization. No evidence of underlying osteomyelitis. IMPRESSION: 1. Poorly defined collection of fluid/inflammation and soft tissue gas within the superficial soft tissues overlying the lower sacrum (just to the RIGHT of midline), measuring 5 x 2 cm (series 2,  image 88). No definite drainable abscess collection. This ill-defined soft tissue inflammation and soft tissue gas extends to the overlying skin in the midline at the level of the mid sacrum, compatible with skin/wound breakdown. Associated soft tissue gas and soft tissue inflammation extends inferiorly to the coccyx and to the level of the medial aspects of the RIGHT gluteus musculature (series 2, image 105). No evidence of intraperitoneal extension. No evidence of underlying osteomyelitis. 2. Fatty infiltration of the liver. 3. **An incidental finding of potential clinical significance has been found. Hypodense masslike lesion involving the caudate lobe of the liver, difficult to definitively characterize due to patient body habitus. Recommend nonemergent liver MRI for further characterization. When the patient is clinically stable and able to follow directions and hold their breath (preferably as an outpatient) further evaluation with dedicated abdominal MRI should be considered.** Electronically Signed   By: Bary Richard M.D.   On: 04/27/2019 15:56   DG Chest Port 1 View  Result Date: 04/29/2019 CLINICAL DATA:  Pneumonia. EXAM: PORTABLE CHEST 1 VIEW COMPARISON:  Radiographs 04/27/2019 and 03/16/2019. Abdominal CT 04/27/2019. FINDINGS: 1241 hours. Improved patient positioning and inspiratory effort. The tracheostomy is well positioned. The heart size and mediastinal contours are stable. There is interval improved aeration of the lung bases which are now clear. No pleural effusion or pneumothorax. The bones appear unchanged. IMPRESSION: Interval improved bibasilar  aeration. No acute cardiopulmonary process. Electronically Signed   By: [place] Bullocks M.D.   On: 04/29/2019 12:57    Medications:   . aspirin EC  81 mg Oral Daily  . Chlorhexidine Gluconate Cloth  6 each Topical Daily  . enoxaparin (LOVENOX) injection  40 mg Subcutaneous Q12H  . famotidine  20 mg Per Tube BID  . free water  100 mL Per Tube Q4H  . insulin aspart  0-20 Units Subcutaneous TID WC  . levETIRAcetam  1,500 mg Per Tube BID  . magnesium oxide  800 mg Per Tube Daily  . mouth rinse  15 mL Mouth Rinse q12n4p  . pneumococcal 23 valent vaccine  0.5 mL Intramuscular Tomorrow-1000  . potassium chloride  40 mEq Per Tube BID   Continuous Infusions: . sodium chloride 250 mL (04/28/19 0630)  . ceFEPime (MAXIPIME) IV 2 g (04/29/19 1013)  . feeding supplement (GLUCERNA 1.5 CAL)    . vancomycin 1,500 mg (04/29/19 0607)     LOS: 2 days   Fusako Tanabe  [facility]   *Please refer to [email or web address], password [email or web address] to get updated schedule on who will round on this patient, as hospitalists switch teams weekly. If 7PM-7AM, please contact night-coverage at [email or web address], password [email or web address] for any overnight needs.  04/29/2019, 1:36 PM

## 2019-04-29 NOTE — Progress Notes (Signed)
Patient turned every two hours during entire shift. This morning change dressing to wound wound beefy red, lots of tunneling noted upwards and sideways of the wound. Due to obesity and insufficient staff as well as it being right before shift change,  not able to properly measure but patient will be going for a wound vac some time today. Performed wet to dry with kerlex, covered with 4x4s and covered with  two  ABD pads secured with paper tape. Changed bed sheets, pads and gown and pulled patient up in the bed all with assistance of Nurse Tech. Patient currently resting. Will give report to oncoming nurse.

## 2019-04-29 NOTE — Progress Notes (Signed)
Initial Nutrition Assessment  DOCUMENTATION CODES:   Morbid obesity  INTERVENTION:  Initiate Glucerna 1.5 Cal at 80 mL/hr. Provides 2880 kcal, 158 grams of protein, 1459 mL H2O daily.  Provide free water flush of 100 mL Q4hrs. Provides 2059 mL H2O daily. Patient will also receive free water flush before and after medications.   Provide Ocuvite daily for wound healing (provides zinc, vitamin A, vitamin C, Vitamin E, copper, and selenium). Once patient more stable can provide Juven BID for wound healing.  NUTRITION DIAGNOSIS:   Inadequate oral intake related to inability to eat as evidenced by (NPO status, reliance on tube feeds via G-tube to meet calorie/protein/hydration needs.).  GOAL:   Patient will meet greater than or equal to 90% of their needs  MONITOR:   Labs, Weight trends, TF tolerance, Skin, I & O's  REASON FOR ASSESSMENT:   Consult Enteral/tube feeding initiation and management  ASSESSMENT:   25 year old male with PMHx of DM, HTN, who was admitted to [facility] on 01/20/2019 with ARDS in setting of COVID-19 requiring ECMO complicated by ischemic stroke and SDH with slight midline shift now with functional quadriplegia s/p tracheostomy tube placement on 02/20/2019 and G-tube placement on 02/21/2019 admitted with sepsis likely due to sacral wound.   Patient unable to provide any history. Initially spoke with his wife over the phone this morning and then met with her at bedside once she arrived to the hospital. When alert patient is able to eat and drink some by mouth for pleasure (water, pudding, applesauce) when he is alert. He is currently NPO here and is not alert enough for safe PO intake. He receives tube feeds through pump at home. He receives Glucerna 1.5 Cal 8 cans per day (1896 mL) poured into the bag and infused through pump at 85-90 mL/hr until he receives all the feeds (rate adjusted as needed by family). Family pours 4 cans in then once that infuses will pour the next  4 cans in. Home tube feed regimen provides 2848 kcal, 157 grams of protein, 1440 mL H2O. He receives 120 mL free water flush before and after hooking up the continuous feeds and then before and after medications.   Wife reports patient's UBW was >400 lbs prior to getting sick. According to chart he was 197.3 kg on 01/14/2019 and 177.8 kg on 03/11/2019. He is now documented to be 164.2 kg (362 lbs). He has lost 33.1 kg (16.8% body weight) over the past 3 months, which is significant for time frame.  Medications reviewed and include: famotidine, Novolog 0-20 units TID, Keppra, magnesium oxide 800 mg daily per tube, potassium chloride 40 mEq per tube for two doses today, cefepime, vancomycin.  Labs reviewed: CBG 138-177, Potassium 3.1, Chloride 97, Creatinine 0.36.  Enteral Access: G-tube present on admission  Discussed with RN. Patient already s/p wound VAC placement today. Plan is for patient to transfer to stepdown.  NUTRITION - FOCUSED PHYSICAL EXAM:    Most Recent Value  Orbital Region  No depletion  Upper Arm Region  No depletion  Thoracic and Lumbar Region  No depletion  Buccal Region  No depletion  Temple Region  No depletion  Clavicle Bone Region  No depletion  Clavicle and Acromion Bone Region  No depletion  Scapular Bone Region  Unable to assess  Dorsal Hand  No depletion  Patellar Region  No depletion  Anterior Thigh Region  No depletion  Posterior Calf Region  No depletion  Edema (RD Assessment)  None  Hair  Reviewed  Eyes  Unable to assess  Mouth  Unable to assess  Skin  Reviewed  Nails  Reviewed     Diet Order:   Diet Order            Diet NPO time specified  Diet effective now             EDUCATION NEEDS:   No education needs have been identified at this time  Skin:  Skin Assessment: Skin Integrity Issues: Skin Integrity Issues:: Stage IV, Stage II, Stage I Stage I: right ischial tuberosity Stage II: left ischial tuberosity Stage IV: sacrum (9cm x 6cm  x6cm) with wound VAC  Last BM:  04/27/2019  Height:   Ht Readings from Last 1 Encounters:  04/27/19 '5\' 9"'  (1.753 m)   Weight:   Wt Readings from Last 1 Encounters:  04/27/19 (!) 164.2 kg   Ideal Body Weight:  65.4 kg(adjusted for functional quadriplegia)  BMI:  Body mass index is 53.46 kg/m.  Estimated Nutritional Needs:   Kcal:  2800-3000  Protein:  >145 grams  Fluid:  2.1-2.5 L/day  Paul Barnacle, MS, RD, LDN Office: [phone] Pager: [phone] After Hours/Weekend Pager: [phone]

## 2019-04-30 LAB — GLUCOSE, CAPILLARY
Glucose-Capillary: 196 mg/dL — ABNORMAL HIGH (ref 70–99)
Glucose-Capillary: 182 mg/dL — ABNORMAL HIGH (ref 70–99)
Glucose-Capillary: 180 mg/dL — ABNORMAL HIGH (ref 70–99)
Glucose-Capillary: 150 mg/dL — ABNORMAL HIGH (ref 70–99)

## 2019-04-30 LAB — BASIC METABOLIC PANEL
Anion gap: 8 (ref 5–15)
BUN: 10 mg/dL (ref 6–20)
CO2: 29 mmol/L (ref 22–32)
Calcium: 9.2 mg/dL (ref 8.9–10.3)
Chloride: 99 mmol/L (ref 98–111)
Creatinine, Ser: <0.3 mg/dL — ABNORMAL LOW (ref 0.61–1.24)
Glucose, Bld: 190 mg/dL — ABNORMAL HIGH (ref 70–99)
Potassium: 3.6 mmol/L (ref 3.5–5.1)
Sodium: 136 mmol/L (ref 135–145)

## 2019-04-30 LAB — CBC WITH DIFFERENTIAL/PLATELET
Abs Immature Granulocytes: 0.04 10*3/uL (ref 0.00–0.07)
Basophils Absolute: 0 10*3/uL (ref 0.0–0.1)
Basophils Relative: 0 %
Eosinophils Absolute: 0.3 10*3/uL (ref 0.0–0.5)
Eosinophils Relative: 3 %
HCT: 32 % — ABNORMAL LOW (ref 39.0–52.0)
Hemoglobin: 9.7 g/dL — ABNORMAL LOW (ref 13.0–17.0)
Immature Granulocytes: 0 %
Lymphocytes Relative: 22 %
Lymphs Abs: 2.4 10*3/uL (ref 0.7–4.0)
MCH: 25.9 pg — ABNORMAL LOW (ref 26.0–34.0)
MCHC: 30.3 g/dL (ref 30.0–36.0)
MCV: 85.6 fL (ref 80.0–100.0)
Monocytes Absolute: 0.8 10*3/uL (ref 0.1–1.0)
Monocytes Relative: 8 %
Neutro Abs: 7.1 10*3/uL (ref 1.7–7.7)
Neutrophils Relative %: 67 %
Platelets: 404 10*3/uL — ABNORMAL HIGH (ref 150–400)
RBC: 3.74 MIL/uL — ABNORMAL LOW (ref 4.22–5.81)
RDW: 13.8 % (ref 11.5–15.5)
WBC: 10.6 10*3/uL — ABNORMAL HIGH (ref 4.0–10.5)
nRBC: 0 % (ref 0.0–0.2)

## 2019-04-30 LAB — MAGNESIUM: Magnesium: 1.7 mg/dL (ref 1.7–2.4)

## 2019-04-30 LAB — VANCOMYCIN, PEAK: Vancomycin Pk: 24 ug/mL — ABNORMAL LOW (ref 30–40)

## 2019-04-30 LAB — VANCOMYCIN, TROUGH: Vancomycin Tr: 17 ug/mL (ref 15–20)

## 2019-04-30 MED ORDER — VANCOMYCIN HCL 1500 MG/300ML IV SOLN
1500.0000 mg | Freq: Two times a day (BID) | INTRAVENOUS | Status: DC
  Filled 2019-04-30: qty 300

## 2019-04-30 MED ORDER — VANCOMYCIN HCL 1500 MG/300ML IV SOLN
1500.0000 mg | Freq: Two times a day (BID) | INTRAVENOUS | Status: DC
  Administered 2019-04-30 – 2019-05-01 (×2): 1500 mg via INTRAVENOUS
  Filled 2019-04-30 (×3): qty 300

## 2019-04-30 MED ORDER — FAMOTIDINE 20 MG PO TABS
20.0000 mg | ORAL_TABLET | Freq: Two times a day (BID) | ORAL | Status: DC
  Administered 2019-04-30 – 2019-05-11 (×22): 20 mg
  Filled 2019-04-30 (×21): qty 1

## 2019-04-30 NOTE — Progress Notes (Signed)
Pharmacy Antibiotic Note  Paul Hall is a 24 y.o. male admitted on 04/27/2019 with sepsis.  Pharmacy has been consulted for Vancomycin, Cefepime  dosing.  Plan: Cefepime 2 gm IV Q8H.  Continue Vancomycin 1500mg  IV Q8hr.   Height: 5\' 10"  (177.8 cm) Weight: (!) 457 lb 14.3 oz (207.7 kg) IBW/kg (Calculated) : 73  Temp (24hrs), Avg:99 F (37.2 C), Min:98.5 F (36.9 C), Max:99.8 F (37.7 C)  Recent Labs  Lab 04/27/19 1147 04/27/19 1241 04/28/19 0521 04/29/19 0601 04/30/19 0531 04/30/19 1008 04/30/19 1341  WBC 15.1*  --  11.7* 10.3 10.6*  --   --   CREATININE 0.42*  --  0.41* 0.36* <0.30*  --   --   LATICACIDVEN  --  1.5 1.1  --   --   --   --   VANCOTROUGH  --   --   --   --   --   --  17  VANCOPEAK  --   --   --   --   --  24*  --     CrCl cannot be calculated (This lab value cannot be used to calculate CrCl because it is not a number: <0.30).    Allergies  Allergen Reactions  . Morphine Hives    Patient has tolerated oxycodone at high amounts during Nov 2020 hospitalization  . Benzonatate Other (See Comments)    Antimicrobials this admission: 1/24 cefepime >>  1/24 vancomycin  >>   Microbiology results: 1/23 BCx: GPC 1/4 1/23 UCx: multiple species; suggest recollection    Thank you for allowing pharmacy to be a part of this patient's care.  Jhene Westmoreland L, PharmD, BCPS 04/30/2019 4:49 PM

## 2019-04-30 NOTE — TOC Initial Note (Signed)
Transition of Care [facility]) - Initial/Assessment Note    Patient Details  Name: Paul Hall MRN: [id number] Date of Birth: 02-Jan-1996  Transition of Care [facility]) CM/SW Contact:    Shelbie Ammons, RN Phone Number: 04/30/2019, 1:58 PM  Clinical Narrative:   RNCM placed call to patient's mother to discuss concerns verbalized through MD of needing assistance with home health. Initially spoke with mother who reported that they had concerns over patient's equipment which they had called to have picked up as they were under the impression this is what they were supposed to do. She was also asking questions about home health which was supposed to be arranged through [facility] after his last rehab stay. Education provided to mother of differences between skilled home health services and personal care services as well as how these two are paid differently. Did discuss with mother that this CM would contact [employer] rep to clarify equipment needs and work on finding a [facility] agency that can accept patient.  RNCM placed call to [facility] with [employer] who was going to look into equipment issues, he was able to stop the pick up of the equipment and schedule for [employer] pump to be taken back out.  RNCM contacted [place] with [facility] who is unable to accept patient. Contacted Jason with [facility] who will check on it and let me know.  RNCM received call from [place], patient's wife who was asking questions regarding Medicaid vs Medicare and filing for disability as well as home health vs personal care. Answered all questions as able and discussed ongoing work towards equipment and setting up home health.           Expected Discharge Plan: Home/Self Care Barriers to Discharge: Barriers Resolved   Patient Goals and CMS Choice Patient states their goals for this hospitalization and ongoing recovery are:: Per family to get him home and taken care of.   Choice offered to / list presented to : Spouse, Parent  Expected Discharge Plan and  Services Expected Discharge Plan: Home/Self Care   Discharge Planning Services: CM Consult   Living arrangements for the past 2 months: [place], [place]                         Representative spoke with at DME Agency: Spoke with Leroy Sea regarding equipment being picked up, he reports that he will take care of it         Representative spoke with at [place]: Corene Cornea, [facility]  Prior Living Arrangements/Services Living arrangements for the past 2 months: [place], [place] Lives with:: Spouse, Parents Patient language and need for interpreter reviewed:: Yes Do you feel safe going back to the place where you live?: Yes      Need for Family Participation in Patient Care: Yes (Comment) Care giver support system in place?: Yes (comment)   Criminal Activity/Legal Involvement Pertinent to Current Situation/Hospitalization: No - Comment as needed  Activities of Daily Living Home Assistive Devices/Equipment: Other (Comment)(bedbound) ADL Screening (condition at time of admission) Patient's cognitive ability adequate to safely complete daily activities?: No Is the patient deaf or have difficulty hearing?: No Does the patient have difficulty seeing, even when wearing glasses/contacts?: No Does the patient have difficulty concentrating, remembering, or making decisions?: Yes Patient able to express need for assistance with ADLs?: No Does the patient have difficulty dressing or bathing?: Yes Independently performs ADLs?: No Communication: Dependent Dressing (OT): Dependent Grooming: Dependent Feeding:  Dependent Bathing: Dependent Toileting: Dependent In/Out Bed: Dependent Walks in Home: Dependent Does the patient have difficulty walking or climbing stairs?: Yes Weakness of Legs: Both Weakness of Arms/Hands: Both  Permission Sought/Granted                  Emotional Assessment Appearance:: (Assessment completed by phone with  wife and mother)            Admission diagnosis:  Sepsis (HCC) [A41.9] Skin ulcer of sacrum with necrosis of muscle (HCC) [L98.423] Sepsis, due to unspecified organism, unspecified whether acute organ dysfunction present [facility]) [A41.9] Patient Active Problem List   Diagnosis Date Noted  . Sepsis (HCC) 04/27/2019  . H/O insulin dependent diabetes mellitus 04/27/2019  . History of stroke 04/27/2019  . Seizure disorder (HCC) 04/27/2019  . Decubitus ulcer of sacral region, unstageable (HCC) 04/27/2019   PCP:  Shawnie Dapper, PA-C Pharmacy:   [facility] [phone] Nicholes Rough, [facility] - [address] [place] [address] Phone: [phone] Fax: [phone]     Social Determinants of Health (SDOH) Interventions    Readmission Risk Interventions No flowsheet data found.

## 2019-04-30 NOTE — Progress Notes (Addendum)
Progress Note    Paul Hall  YBF:[id number] DOB: May 07, 1995  DOA: 04/27/2019 PCP: Cory Munch, PA-C      Brief Narrative:    Medical records reviewed and are as summarized below:  Paul Hall is an 24 y.o. male with medical history significant for respiratory failure, failure to wean from ventilator requiring tracheostomy, insulin-dependent diabetes mellitus, shock requiring vasopressors, hypertension, Serratia urinary tract infection, stroke leading to quadriplegia, seizures, subdural hematoma, bilateral lower extremity DVT status post IVC filter placement 02/16/2019, dysphagia status post PEG tube placement, urinary retention with chronic indwelling Foley catheter, OMAYO-45 infection complicated by ARDS requiring ECMO.  He was admitted to [facility] in [place] on 01/20/2019 and discharged to acute rehab on 03/15/2019.  According to his wife at the bedside, patient was discharged from the rehab facility a few weeks ago.    Patient is nonverbal and unable to provide any history so history was obtained from chart review and his wife at the bedside.  His wife said patient was brought to the emergency room at the recommendation of his PCP for evaluation of sacral decubitus ulcer.  She said she developed a sacral ulcer from his hospitalization at [facility].  She said the patient could not be turned while he was on ECMO and that led to the development of the ulcer.  She also said that there has been intermittent drainage from the wound.  She also said PCP was concerned about the tracheostomy and said that patient no longer required a tracheostomy wanted it to be removed.  She does not report any fever or vomiting.  His wife said that patient is nonverbal but sometimes he can move his extremities from side to side.      Assessment/Plan:   Principal Problem:   Sepsis ([place]) Active Problems:   H/O insulin dependent diabetes mellitus   History of stroke   Seizure  disorder ([place])   Decubitus ulcer of sacral region, unstageable ([place])   Body mass index is 65.7 kg/m.  (Morbid obesity)   Sepsis due to unstageable sacral wound infection/leukocytosis: Leukocytosis has improved.  Continue empiric IV antibiotics.   General surgeon recommended wound VAC therapy.  No surgery was recommended.  Surgeon has signed off.  No evidence of osteomyelitis on CT pelvis.   No growth on blood cultures thus far.  Patient is still tachycardic and tachypneic.  However, it appears tachycardia and tachypnea are chronic.  Discharge summary from [facility] on 03/15/2019 showed the patient was tachycardic tachypneic at the time of discharge.  Heart rate was 112 and respiratory rate was 35.  Hypotension: BP has improved but hold antihypertensives for now.  Hold amlodipine, chlorthalidone and Coreg  Hypokalemia: Improved  Insulin-dependent diabetes mellitus: Continue insulin and monitor glucose levels  History of stroke with quadriplegia: Continue aspirin  Seizure disorder: Continue Keppra  Dysphagia: Continue enteral nutrition.    Urinary retention with abnormal urinalysis: Chronic indwelling Foley catheter in place.  Abnormal urinalysis is probably due to indwelling Foley catheter.  chronic hypoxemic respiratory failure s/p tracheostomy: He is on FiO2 28% and 5 L/min oxygen via trach collar.  Minimal of 28% FiO2 is required for humidity through trach collar for respiratory therapist.  Oxygen saturation is 100%.  Patient is not hypoxemic.  His wife said he has intermittent oxygen desaturation especially when he is sleeping and they were told in the past that this was from obstructive sleep apnea.  Patient is yet to undergo a sleep  study.  No acute abnormality on repeat chest x-ray.  History of bilateral lower extremity DVT status post IVC filter placement: Continue Lovenox for DVT prophylaxis  Hyponatremia: Improved.   Family Communication/Anticipated D/C date and  plan/Code Status   DVT prophylaxis: Lovenox Code Status: Full code Family Communication: Plan discussed with his wife at the bedside.  His mother was also on speaker phone during this discussion. Disposition Plan: Possible discharge to home with home health care in 2 days.  Consulted case manager to assist with discharge planning.     Subjective:   Patient is unable to provide any history. I met with his wife at the bedside.   Objective:    Vitals:   04/30/19 0700 04/30/19 0800 04/30/19 0900 04/30/19 1000  BP: 113/70 121/67 115/65 114/67  Pulse: (!) 108 (!) 110 (!) 113 (!) 110  Resp: (!) 24 (!) 30 (!) 28 (!) 36  Temp:      TempSrc:      SpO2: 100% 100% 100% 100%  Weight:      Height:        Intake/Output Summary (Last 24 hours) at 04/30/2019 1137 Last data filed at 04/30/2019 0535 Gross per 24 hour  Intake 1045.09 ml  Output 1400 ml  Net -354.91 ml   Filed Weights   04/27/19 1844 04/30/19 0452  Weight: (!) 164.2 kg (!) 207.7 kg    Exam:  GEN: NAD SKIN: Unstageable sacral decubitus ulcer EYES: No pallor or icterus ENT: MMM, +tracheostomy CV: RRR PULM: No rales or wheezing heard ABD: soft, obese, NT, +BS, +PEG tube CNS: Nonverbal, quadriplegic EXT: No edema    Data Reviewed:   I have personally reviewed following labs and imaging studies:  Labs: Labs show the following:   Basic Metabolic Panel: Recent Labs  Lab 04/27/19 1147 04/27/19 1147 04/28/19 0521 04/28/19 0521 04/29/19 0601 04/29/19 0620 04/30/19 0531  NA 131*  --  137  --  137  --  136  K 3.5   < > 3.8   < > 3.1*  --  3.6  CL 88*  --  95*  --  97*  --  99  CO2 32  --  30  --  29  --  29  GLUCOSE 247*  --  181*  --  144*  --  190*  BUN 12  --  9  --  6  --  10  CREATININE 0.42*  --  0.41*  --  0.36*  --  <0.30*  CALCIUM 9.3  --  9.2  --  9.4  --  9.2  MG  --   --   --   --   --  1.9 1.7   < > = values in this interval not displayed.   GFR CrCl cannot be calculated (This lab value  cannot be used to calculate CrCl because it is not a number: <0.30). Liver Function Tests: Recent Labs  Lab 04/27/19 1147  AST 37  ALT 109*  ALKPHOS 70  BILITOT 0.5  PROT 7.8  ALBUMIN 2.6*   No results for input(s): LIPASE, AMYLASE in the last 168 hours. No results for input(s): AMMONIA in the last 168 hours. Coagulation profile Recent Labs  Lab 04/27/19 1241  INR 1.2    CBC: Recent Labs  Lab 04/27/19 1147 04/28/19 0521 04/29/19 0601 04/30/19 0531  WBC 15.1* 11.7* 10.3 10.6*  NEUTROABS 10.1*  --  6.7 7.1  HGB 10.3* 10.2* 10.4* 9.7*  HCT 33.0* 33.0*  33.6* 32.0*  MCV 85.1 85.3 85.5 85.6  PLT 434* 375 397 404*   Cardiac Enzymes: No results for input(s): CKTOTAL, CKMB, CKMBINDEX, TROPONINI in the last 168 hours. BNP (last 3 results) No results for input(s): PROBNP in the last 8760 hours. CBG: Recent Labs  Lab 04/29/19 1136 04/29/19 1508 04/29/19 1713 04/29/19 2234 04/30/19 0742  GLUCAP 146* 142* 137* 165* 196*   D-Dimer: No results for input(s): DDIMER in the last 72 hours. Hgb A1c: No results for input(s): HGBA1C in the last 72 hours. Lipid Profile: No results for input(s): CHOL, HDL, LDLCALC, TRIG, CHOLHDL, LDLDIRECT in the last 72 hours. Thyroid function studies: No results for input(s): TSH, T4TOTAL, T3FREE, THYROIDAB in the last 72 hours.  Invalid input(s): FREET3 Anemia work up: No results for input(s): VITAMINB12, FOLATE, FERRITIN, TIBC, IRON, RETICCTPCT in the last 72 hours. Sepsis Labs: Recent Labs  Lab 04/27/19 1147 04/27/19 1241 04/28/19 0521 04/29/19 0601 04/30/19 0531  WBC 15.1*  --  11.7* 10.3 10.6*  LATICACIDVEN  --  1.5 1.1  --   --     Microbiology Recent Results (from the past 240 hour(s))  Blood Culture (routine x 2)     Status: None (Preliminary result)   Collection Time: 04/27/19 12:43 PM   Specimen: BLOOD  Result Value Ref Range Status   Specimen Description BLOOD LEFT ANTECUBITAL  Final   Special Requests   Final     BOTTLES DRAWN AEROBIC AND ANAEROBIC Blood Culture results may not be optimal due to an inadequate volume of blood received in culture bottles   Culture   Final    NO GROWTH 3 DAYS Performed at [facility], [address] [place], [place] [address]    Report Status PENDING  Incomplete  Blood Culture (routine x 2)     Status: None (Preliminary result)   Collection Time: 04/27/19 12:43 PM   Specimen: BLOOD  Result Value Ref Range Status   Specimen Description   Final    BLOOD BLOOD RIGHT FOREARM Performed at [facility], [address], [place], [place] [address]    Special Requests   Final    BOTTLES DRAWN AEROBIC AND ANAEROBIC Blood Culture adequate volume Performed at [facility], [address] [place], [place] [address]    Culture  Setup Time   Final    AEROBIC BOTTLE ONLY GRAM POSITIVE RODS CRITICAL RESULT CALLED TO, READ BACK BY AND VERIFIED WITH: [place] ON 04/28/19 AT 2229 [facility] Performed at [facility], [address], [place], [place] [address]    Culture   Final    Lonell Grandchild POSITIVE RODS IDENTIFICATION TO FOLLOW Performed at [facility], [place] [address] [place], [place] [address]    Report Status PENDING  Incomplete  Urine culture     Status: Abnormal   Collection Time: 04/27/19 12:43 PM   Specimen: In/Out Cath Urine  Result Value Ref Range Status   Specimen Description   Final    IN/OUT CATH URINE Performed at [facility], [address] [place], [place] [address]    Special Requests   Final    NONE Performed at [facility], [address] [place], [place] [address]    Culture MULTIPLE SPECIES PRESENT, SUGGEST RECOLLECTION (A)  Final   Report Status 04/29/2019 FINAL  Final  Respiratory Panel by RT PCR (Flu A&B, Covid) -     Status: None   Collection Time: 04/27/19 12:43 PM  Result Value  Ref Range Status   SARS Coronavirus 2 by RT PCR NEGATIVE NEGATIVE Final    Comment: (NOTE) SARS-CoV-2  target nucleic acids are NOT DETECTED. The SARS-CoV-2 RNA is generally detectable in upper respiratoy specimens during the acute phase of infection. The lowest concentration of SARS-CoV-2 viral copies this assay can detect is 131 copies/mL. A negative result does not preclude SARS-Cov-2 infection and should not be used as the sole basis for treatment or other patient management decisions. A negative result may occur with  improper specimen collection/handling, submission of specimen other than nasopharyngeal swab, presence of viral mutation(s) within the areas targeted by this assay, and inadequate number of viral copies (<131 copies/mL). A negative result must be combined with clinical observations, patient history, and epidemiological information. The expected result is Negative. Fact Sheet for Patients:  PinkCheek.be Fact Sheet for Healthcare Providers:  GravelBags.it This test is not yet ap proved or cleared by the [place] FDA and  has been authorized for detection and/or diagnosis of SARS-CoV-2 by FDA under an Emergency Use Authorization (EUA). This EUA will remain  in effect (meaning this test can be used) for the duration of the COVID-19 declaration under Section 564(b)(1) of the Act, 21 U.S.C. section 360bbb-3(b)(1), unless the authorization is terminated or revoked sooner.    Influenza A by PCR NEGATIVE NEGATIVE Final   Influenza B by PCR NEGATIVE NEGATIVE Final    Comment: (NOTE) The Xpert Xpress SARS-CoV-2/FLU/RSV assay is intended as an aid in  the diagnosis of influenza from Nasopharyngeal swab specimens and  should not be used as a sole basis for treatment. Nasal washings and  aspirates are unacceptable for Xpert Xpress SARS-CoV-2/FLU/RSV  testing. Fact Sheet for Patients: PinkCheek.be Fact Sheet for Healthcare Providers: GravelBags.it This test is  not yet approved or cleared by the [place] FDA and  has been authorized for detection and/or diagnosis of SARS-CoV-2 by  FDA under an Emergency Use Authorization (EUA). This EUA will remain  in effect (meaning this test can be used) for the duration of the  Covid-19 declaration under Section 564(b)(1) of the Act, 21  U.S.C. section 360bbb-3(b)(1), unless the authorization is  terminated or revoked. Performed at [facility], [address] [place], [place] [address]   C difficile quick scan w PCR reflex     Status: None   Collection Time: 04/27/19  1:10 PM   Specimen: Stool  Result Value Ref Range Status   C Diff antigen NEGATIVE NEGATIVE Final   C Diff toxin NEGATIVE NEGATIVE Final   C Diff interpretation No C. difficile detected.  Final    Comment: Performed at [facility], [address] [place], [place] [address]  MRSA PCR Screening     Status: None   Collection Time: 04/29/19  3:47 PM   Specimen: Nasopharyngeal  Result Value Ref Range Status   MRSA by PCR NEGATIVE NEGATIVE Final    Comment:        The GeneXpert MRSA Assay (FDA approved for NASAL specimens only), is one component of a comprehensive MRSA colonization surveillance program. It is not intended to diagnose MRSA infection nor to guide or monitor treatment for MRSA infections. Performed at [facility], [address], [place], [place] [address]     Procedures and diagnostic studies:  DG Chest [facility] 1 View  Result Date: 04/29/2019 CLINICAL DATA:  Pneumonia. EXAM: PORTABLE CHEST 1 VIEW COMPARISON:  Radiographs 04/27/2019 and 03/16/2019. Abdominal CT 04/27/2019. FINDINGS: 1241 hours. Improved patient positioning  and inspiratory effort. The tracheostomy is well positioned. The heart size and mediastinal contours are stable. There is interval improved aeration of the lung bases which are now clear. No pleural effusion or pneumothorax. The bones appear unchanged. IMPRESSION:  Interval improved bibasilar aeration. No acute cardiopulmonary process. Electronically Signed   By: Richardean Sale M.D.   On: 04/29/2019 12:57    Medications:   . aspirin EC  81 mg Oral Daily  . Chlorhexidine Gluconate Cloth  6 each Topical Daily  . enoxaparin (LOVENOX) injection  40 mg Subcutaneous Q12H  . famotidine  20 mg Per Tube BID  . free water  100 mL Per Tube Q4H  . insulin aspart  0-20 Units Subcutaneous TID WC  . levETIRAcetam  1,500 mg Per Tube BID  . magnesium oxide  800 mg Per Tube Daily  . mouth rinse  15 mL Mouth Rinse q12n4p  . pneumococcal 23 valent vaccine  0.5 mL Intramuscular Tomorrow-1000   Continuous Infusions: . sodium chloride 250 mL (04/28/19 0630)  . ceFEPime (MAXIPIME) IV 2 g (04/30/19 0732)  . feeding supplement (GLUCERNA 1.5 CAL) 1,000 mL (04/29/19 1645)  . vancomycin 1,500 mg (04/30/19 0535)     LOS: 3 days   Celes Dedic  [facility]   *Please refer to [email or web address], password [email or web address] to get updated schedule on who will round on this patient, as hospitalists switch teams weekly. If 7PM-7AM, please contact night-coverage at [email or web address], password [email or web address] for any overnight needs.  04/30/2019, 11:37 AM

## 2019-04-30 NOTE — Progress Notes (Signed)
Inpatient Diabetes Program Recommendations  AACE/ADA: New Consensus Statement on Inpatient Glycemic Control (2015)  Target Ranges:  Prepandial:   less than 140 mg/dL      Peak postprandial:   less than 180 mg/dL (1-2 hours)      Critically ill patients:  140 - 180 mg/dL   Lab Results  Component Value Date   GLUCAP 182 (H) 04/30/2019    Review of Glycemic Control Results for Paul Hall, Paul Hall (MRN [id number]) as of 04/30/2019 12:54  Ref. Range 04/29/2019 15:08 04/29/2019 17:13 04/29/2019 22:34 04/30/2019 07:42 04/30/2019 11:27  Glucose-Capillary Latest Ref Range: 70 - 99 mg/dL 449 (H) 675 (H) 916 (H) 196 (H) 182 (H)   Diabetes history: DM2 Current orders for Inpatient glycemic control: Novolog resistant correction tid  Inpatient Diabetes Program Recommendations:   While patient is NPO, Change Novolog correction to q 4 hrs.  Thank you, Billy Fischer. Madoline Bhatt, RN, MSN, CDE  Diabetes Coordinator Inpatient Glycemic Control Team Team Pager [phone] (8am-5pm) 04/30/2019 12:57 PM

## 2019-05-01 DIAGNOSIS — Z7189 Other specified counseling: Secondary | ICD-10-CM

## 2019-05-01 DIAGNOSIS — Z515 Encounter for palliative care: Secondary | ICD-10-CM

## 2019-05-01 LAB — CBC WITH DIFFERENTIAL/PLATELET
Abs Immature Granulocytes: 0.03 10*3/uL (ref 0.00–0.07)
Basophils Absolute: 0 10*3/uL (ref 0.0–0.1)
Basophils Relative: 0 %
Eosinophils Absolute: 0.3 10*3/uL (ref 0.0–0.5)
Eosinophils Relative: 3 %
HCT: 34.6 % — ABNORMAL LOW (ref 39.0–52.0)
Hemoglobin: 10.3 g/dL — ABNORMAL LOW (ref 13.0–17.0)
Immature Granulocytes: 0 %
Lymphocytes Relative: 27 %
Lymphs Abs: 2.5 10*3/uL (ref 0.7–4.0)
MCH: 25.8 pg — ABNORMAL LOW (ref 26.0–34.0)
MCHC: 29.8 g/dL — ABNORMAL LOW (ref 30.0–36.0)
MCV: 86.7 fL (ref 80.0–100.0)
Monocytes Absolute: 0.7 10*3/uL (ref 0.1–1.0)
Monocytes Relative: 7 %
Neutro Abs: 5.8 10*3/uL (ref 1.7–7.7)
Neutrophils Relative %: 63 %
Platelets: 369 10*3/uL (ref 150–400)
RBC: 3.99 MIL/uL — ABNORMAL LOW (ref 4.22–5.81)
RDW: 13.5 % (ref 11.5–15.5)
WBC: 9.3 10*3/uL (ref 4.0–10.5)
nRBC: 0 % (ref 0.0–0.2)

## 2019-05-01 LAB — GLUCOSE, CAPILLARY
Glucose-Capillary: 162 mg/dL — ABNORMAL HIGH (ref 70–99)
Glucose-Capillary: 173 mg/dL — ABNORMAL HIGH (ref 70–99)
Glucose-Capillary: 183 mg/dL — ABNORMAL HIGH (ref 70–99)
Glucose-Capillary: 188 mg/dL — ABNORMAL HIGH (ref 70–99)

## 2019-05-01 LAB — CULTURE, BLOOD (ROUTINE X 2): Special Requests: ADEQUATE

## 2019-05-01 LAB — BASIC METABOLIC PANEL
Anion gap: 10 (ref 5–15)
BUN: 9 mg/dL (ref 6–20)
CO2: 27 mmol/L (ref 22–32)
Calcium: 9.2 mg/dL (ref 8.9–10.3)
Chloride: 101 mmol/L (ref 98–111)
Creatinine, Ser: 0.32 mg/dL — ABNORMAL LOW (ref 0.61–1.24)
GFR calc Af Amer: >60 mL/min (ref >60)
GFR calc non Af Amer: >60 mL/min (ref >60)
Glucose, Bld: 172 mg/dL — ABNORMAL HIGH (ref 70–99)
Potassium: 3.9 mmol/L (ref 3.5–5.1)
Sodium: 138 mmol/L (ref 135–145)

## 2019-05-01 MED ORDER — INSULIN ASPART 100 UNIT/ML ~~LOC~~ SOLN
0.0000 [IU] | Freq: Four times a day (QID) | SUBCUTANEOUS | Status: DC
  Administered 2019-05-01 – 2019-05-02 (×2): 4 [IU] via SUBCUTANEOUS
  Administered 2019-05-02: 7 [IU] via SUBCUTANEOUS
  Administered 2019-05-02 – 2019-05-03 (×4): 4 [IU] via SUBCUTANEOUS
  Administered 2019-05-03 (×2): 7 [IU] via SUBCUTANEOUS
  Administered 2019-05-04 (×2): 4 [IU] via SUBCUTANEOUS
  Administered 2019-05-04: 7 [IU] via SUBCUTANEOUS
  Filled 2019-05-01 (×12): qty 1

## 2019-05-01 MED ORDER — VANCOMYCIN HCL 2000 MG/400ML IV SOLN
2000.0000 mg | Freq: Two times a day (BID) | INTRAVENOUS | Status: DC
  Administered 2019-05-01 – 2019-05-03 (×4): 2000 mg via INTRAVENOUS
  Filled 2019-05-01 (×6): qty 400

## 2019-05-01 MED ORDER — HYDROGEN PEROXIDE 3 % EX SOLN
Freq: Two times a day (BID) | CUTANEOUS | Status: DC
  Filled 2019-05-01: qty 473

## 2019-05-01 NOTE — Progress Notes (Signed)
PROGRESS NOTE    Paul Hall  KOE:[id number] DOB: [birth year] DOA: 04/27/2019  PCP: Shawnie Dapper, PA-C    LOS - 4   Brief Narrative:  Medical records reviewed and are as summarized below:  Paul Hall is an 24 y.o. 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 He was admitted to [facility] in [facility] 10/18/2020and discharged to acute rehab on 03/15/2019. According to his wife at the bedside, patient was discharged from the rehab facility a few weeks ago.  Patient is nonverbal and unable to provide any history so history was obtained from chart review andhis wife at the bedside. His wife said patient was brought to the emergency room at the recommendation of his PCP for evaluation of sacral decubitus ulcer. She said hedeveloped a sacral ulcer from his hospitalization at [facility]. She said the patient could not be turned while he was on ECMO and that led to the development of the ulcer. She also said that there has been intermittent drainage from the wound. She also said PCP was concerned about the tracheostomy and said that patient no longer required a tracheostomy wanted it to be removed. She does not report any fever or vomiting. His wife said that patient is nonverbal but sometimes he can move his extremities from side to side.  Subjective 1/27: Patient seen this AM, sleeping comfortably.  Wakes but nonverbal in answering questions.  Wound care at bedside for changing sacral dressings.   No acute events reported.   Assessment & Plan:   Principal Problem:   Sepsis (HCC) Active Problems:   H/O insulin dependent diabetes mellitus   History of stroke   Seizure disorder (HCC)   Decubitus ulcer of sacral region, unstageable (HCC)   Sepsis due to unstageable sacral wound infection/leukocytosis: Leukocytosis has improved.  Continue empiric IV antibiotics.  General surgeon recommended wound VAC therapy.  No surgery was recommended.  Surgeon has signed off.  No evidence of osteomyelitis on CT pelvis.  No growth on blood cultures thus far.  Patient is still tachycardic and tachypneic.  However, it appears tachycardia and tachypnea are chronic.  Discharge summary from [facility] on 03/15/2019 showed the patient was tachycardic tachypneic at the time of discharge.  Heart rate was 112 and respiratory rate was 35.  Hypotension: BP has improved but hold antihypertensives for now.  Hold amlodipine, chlorthalidone and Coreg  Hypokalemia: Improved  Insulin-dependent diabetes mellitus: Continue insulin and monitor glucose levels  History of stroke with quadriplegia: Continue aspirin  Seizure disorder: Continue Keppra  Dysphagia: Continue enteral nutrition.   Urinary retention with abnormal urinalysis: Chronic indwelling Foley catheter in place. Abnormal urinalysis is probably due to indwelling Foley catheter.  Chronic hypoxemic respiratory failure s/p tracheostomy: He is on FiO2 28% and 5 L/min oxygen via trach collar.  Minimal of 28% FiO2 is required for humidity through trach collar for respiratory therapist.  Oxygen saturation is 100%.  Patient is not hypoxemic.  His wife said he has intermittent oxygen desaturation especially when he is sleeping and they were told in the past that this was from obstructive sleep apnea.  Patient is yet to undergo a sleep study.  No  acute abnormality on repeat chest x-ray.  History of bilateral lower extremity DVT status post IVC filter  placement: Continue Lovenox for DVT prophylaxis  Hyponatremia: Improved.  Morbid Obesity - BMI 65.7   DVT prophylaxis: Lovenox   Code Status: Full Code  Family Communication: none at bedside  Disposition Plan:  Pending home health set up for wound vac.     Consultants:   General surgery - signed off   Antimicrobials:   Cefepime 1/23 >> 1/27  Vancomycin 1/23 >> 1/27  Flagyl once 1/23   Objective: Vitals:   05/01/19 0500 05/01/19 0600 05/01/19 0700 05/01/19 0800  BP: 91/64 96/63 108/65 (!) 101/57  Pulse: (!) 102 97 98 100  Resp: (!) 32 14 (!) 25 (!) 24  Temp:    99.5 F (37.5 C)  TempSrc:    Axillary  SpO2: 100% 100% 100% 100%  Weight: (!) 209 kg     Height:        Intake/Output Summary (Last 24 hours) at 05/01/2019 1137 Last data filed at 05/01/2019 0600 Gross per 24 hour  Intake 1246 ml  Output 1940 ml  Net -694 ml   Filed Weights   04/27/19 1844 04/30/19 0452 05/01/19 0500  Weight: (!) 164.2 kg (!) 207.7 kg (!) 209 kg    Examination:  General exam: sleeping, no acute distress, morbidly obese HEENT: atraumatic, tracheostomy intact Respiratory system: clear to auscultation anteriorly, no wheezes, rales or rhonchi, normal respiratory effort. Cardiovascular system: normal S1/S2, RRR, no pedal edema.   Gastrointestinal system: soft, non-tender, hypoactive bowel sounds. Central Nervous system: follows commands, sleeping but awakes  Skin: dry, intact, normal temperature Psychiatry: unable to evaluate as patient is nonverbal.  Affect appears normal.    Data Reviewed: I have personally reviewed following labs and imaging studies  CBC: Recent Labs  Lab 04/27/19 1147 04/28/19 0521 04/29/19 0601 04/30/19 0531 05/01/19 0624  WBC 15.1* 11.7* 10.3 10.6* 9.3  NEUTROABS 10.1*  --  6.7 7.1 5.8  HGB 10.3* 10.2* 10.4* 9.7* 10.3*  HCT 33.0* 33.0* 33.6* 32.0* 34.6*  MCV 85.1 85.3 85.5 85.6 86.7  PLT 434* 375 397 404* 369   Basic Metabolic Panel: Recent  Labs  Lab 04/27/19 1147 04/28/19 0521 04/29/19 0601 04/29/19 0620 04/30/19 0531 05/01/19 0528  NA 131* 137 137  --  136 138  K 3.5 3.8 3.1*  --  3.6 3.9  CL 88* 95* 97*  --  99 101  CO2 32 30 29  --  29 27  GLUCOSE 247* 181* 144*  --  190* 172*  BUN 12 9 6   --  10 9  CREATININE 0.42* 0.41* 0.36*  --  <0.30* 0.32*  CALCIUM 9.3 9.2 9.4  --  9.2 9.2  MG  --   --   --  1.9 1.7  --    GFR: Estimated Creatinine Clearance: 258.8 mL/min (A) (by C-G formula based on SCr of 0.32 mg/dL (L)). Liver Function Tests: Recent Labs  Lab 04/27/19 1147  AST 37  ALT 109*  ALKPHOS 70  BILITOT 0.5  PROT 7.8  ALBUMIN 2.6*   No results for input(s): LIPASE, AMYLASE in the last 168 hours. No results for input(s): AMMONIA in the last 168 hours. Coagulation Profile: Recent Labs  Lab 04/27/19 1241  INR 1.2   Cardiac Enzymes: No results for input(s): CKTOTAL, CKMB, CKMBINDEX, TROPONINI in the last 168 hours. BNP (last 3 results) No results for input(s): PROBNP in the last 8760 hours. HbA1C: No results  for input(s): HGBA1C in the last 72 hours. CBG: Recent Labs  Lab 04/30/19 0742 04/30/19 1127 04/30/19 1614 04/30/19 2112 05/01/19 0720  GLUCAP 196* 182* 180* 150* 173*   Lipid Profile: No results for input(s): CHOL, HDL, LDLCALC, TRIG, CHOLHDL, LDLDIRECT in the last 72 hours. Thyroid Function Tests: No results for input(s): TSH, T4TOTAL, FREET4, T3FREE, THYROIDAB in the last 72 hours. Anemia Panel: No results for input(s): VITAMINB12, FOLATE, FERRITIN, TIBC, IRON, RETICCTPCT in the last 72 hours. Sepsis Labs: Recent Labs  Lab 04/27/19 1241 04/28/19 0521  LATICACIDVEN 1.5 1.1    Recent Results (from the past 240 hour(s))  Blood Culture (routine x 2)     Status: None (Preliminary result)   Collection Time: 04/27/19 12:43 PM   Specimen: BLOOD  Result Value Ref Range Status   Specimen Description BLOOD LEFT ANTECUBITAL  Final   Special Requests   Final    BOTTLES DRAWN AEROBIC  AND ANAEROBIC Blood Culture results may not be optimal due to an inadequate volume of blood received in culture bottles   Culture   Final    NO GROWTH 4 DAYS Performed at [facility], [address] [place], [place] [address]    Report Status PENDING  Incomplete  Blood Culture (routine x 2)     Status: None (Preliminary result)   Collection Time: 04/27/19 12:43 PM   Specimen: BLOOD  Result Value Ref Range Status   Specimen Description   Final    BLOOD BLOOD RIGHT FOREARM Performed at [facility], [address] [place], [place] [address]    Special Requests   Final    BOTTLES DRAWN AEROBIC AND ANAEROBIC Blood Culture adequate volume Performed at [facility], [address] [place], [place] [address]    Culture  Setup Time   Final    AEROBIC BOTTLE ONLY GRAM POSITIVE RODS CRITICAL RESULT CALLED TO, READ BACK BY AND VERIFIED WITH: SCOTT HALL ON 04/28/19 AT 2229 [facility] Performed at [facility], [address] [place], [place] [address]    Culture   Final    Romie MinusGRAM POSITIVE RODS IDENTIFICATION TO FOLLOW Performed at [facility], [address] [place], [place] [address]    Report Status PENDING  Incomplete  Urine culture     Status: Abnormal   Collection Time: 04/27/19 12:43 PM   Specimen: In/Out Cath Urine  Result Value Ref Range Status   Specimen Description   Final    IN/OUT CATH URINE Performed at [facility], [address] [place], [place] [address]    Special Requests   Final    NONE Performed at [facility], [address] [place], [place] [address]    Culture MULTIPLE SPECIES PRESENT, SUGGEST RECOLLECTION (A)  Final   Report Status 04/29/2019 FINAL  Final  Respiratory Panel by RT PCR (Flu A&B, Covid) -     Status: None   Collection Time: 04/27/19 12:43 PM  Result Value Ref Range Status   SARS Coronavirus 2 by RT PCR NEGATIVE NEGATIVE Final    Comment: (NOTE) SARS-CoV-2 target nucleic acids  are NOT DETECTED. The SARS-CoV-2 RNA is generally detectable in upper respiratoy specimens during the acute phase of infection. The lowest concentration of SARS-CoV-2 viral copies this assay can detect is 131 copies/mL. A negative result does not preclude SARS-Cov-2 infection and should not be used as the sole basis for treatment or other patient management decisions. A negative result may occur with  improper  specimen collection/handling, submission of specimen other than nasopharyngeal swab, presence of viral mutation(s) within the areas targeted by this assay, and inadequate number of viral copies (<131 copies/mL). A negative result must be combined with clinical observations, patient history, and epidemiological information. The expected result is Negative. Fact Sheet for Patients:  PinkCheek.be Fact Sheet for Healthcare Providers:  GravelBags.it This test is not yet ap proved or cleared by the [place] FDA and  has been authorized for detection and/or diagnosis of SARS-CoV-2 by FDA under an Emergency Use Authorization (EUA). This EUA will remain  in effect (meaning this test can be used) for the duration of the COVID-19 declaration under Section 564(b)(1) of the Act, 21 U.S.C. section 360bbb-3(b)(1), unless the authorization is terminated or revoked sooner.    Influenza A by PCR NEGATIVE NEGATIVE Final   Influenza B by PCR NEGATIVE NEGATIVE Final    Comment: (NOTE) The Xpert Xpress SARS-CoV-2/FLU/RSV assay is intended as an aid in  the diagnosis of influenza from Nasopharyngeal swab specimens and  should not be used as a sole basis for treatment. Nasal washings and  aspirates are unacceptable for Xpert Xpress SARS-CoV-2/FLU/RSV  testing. Fact Sheet for Patients: PinkCheek.be Fact Sheet for Healthcare Providers: GravelBags.it This test is not yet approved or  cleared by the [place] FDA and  has been authorized for detection and/or diagnosis of SARS-CoV-2 by  FDA under an Emergency Use Authorization (EUA). This EUA will remain  in effect (meaning this test can be used) for the duration of the  Covid-19 declaration under Section 564(b)(1) of the Act, 21  U.S.C. section 360bbb-3(b)(1), unless the authorization is  terminated or revoked. Performed at [facility], [address] [place], [place] [address]   C difficile quick scan w PCR reflex     Status: None   Collection Time: 04/27/19  1:10 PM   Specimen: Stool  Result Value Ref Range Status   C Diff antigen NEGATIVE NEGATIVE Final   C Diff toxin NEGATIVE NEGATIVE Final   C Diff interpretation No C. difficile detected.  Final    Comment: Performed at [facility], [address] [place], [place] [address]  MRSA PCR Screening     Status: None   Collection Time: 04/29/19  3:47 PM   Specimen: Nasopharyngeal  Result Value Ref Range Status   MRSA by PCR NEGATIVE NEGATIVE Final    Comment:        The GeneXpert MRSA Assay (FDA approved for NASAL specimens only), is one component of a comprehensive MRSA colonization surveillance program. It is not intended to diagnose MRSA infection nor to guide or monitor treatment for MRSA infections. Performed at [facility], [address], [place], [place] [address]          Radiology Studies: [facility] Chest [place] 1 View  Result Date: 04/29/2019 CLINICAL DATA:  Pneumonia. EXAM: PORTABLE CHEST 1 VIEW COMPARISON:  Radiographs 04/27/2019 and 03/16/2019. Abdominal CT 04/27/2019. FINDINGS: 1241 hours. Improved patient positioning and inspiratory effort. The tracheostomy is well positioned. The heart size and mediastinal contours are stable. There is interval improved aeration of the lung bases which are now clear. No pleural effusion or pneumothorax. The bones appear unchanged. IMPRESSION: Interval improved bibasilar  aeration. No acute cardiopulmonary process. Electronically Signed   By: Richardean Sale M.D.   On: 04/29/2019 12:57        Scheduled Meds: . aspirin EC  81 mg Oral Daily  . Chlorhexidine Gluconate Cloth  6 each  Topical Daily  . enoxaparin (LOVENOX) injection  40 mg Subcutaneous Q12H  . famotidine  20 mg Per Tube BID  . free water  100 mL Per Tube Q4H  . insulin aspart  0-20 Units Subcutaneous TID WC  . levETIRAcetam  1,500 mg Per Tube BID  . magnesium oxide  800 mg Per Tube Daily  . mouth rinse  15 mL Mouth Rinse q12n4p  . pneumococcal 23 valent vaccine  0.5 mL Intramuscular Tomorrow-1000   Continuous Infusions: . sodium chloride 10 mL/hr at 05/01/19 0000  . ceFEPime (MAXIPIME) IV 2 g (05/01/19 0705)  . feeding supplement (GLUCERNA 1.5 CAL) 80 mL/hr at 05/01/19 0600  . vancomycin 1,500 mg (05/01/19 0930)     LOS: 4 days    Time spent: 35 minutes    Pennie Banter, DO [facility]   If 7PM-7AM, please contact night-coverage [email or web address] 05/01/2019, 11:37 AM

## 2019-05-01 NOTE — Progress Notes (Signed)
Pharmacy Antibiotic Note  Paul Hall is a 24 y.o. male admitted on 04/27/2019 with sepsis.  Pharmacy has been consulted for Vancomycin, Cefepime  dosing.  Plan: Cefepime 2 gm IV Q8H.  Continue Vancomycin 2000mg  IV Q12hr.   Height: 5\' 10"  (177.8 cm) Weight: (!) 460 lb 12.2 oz (209 kg) IBW/kg (Calculated) : 73  Temp (24hrs), Avg:98.8 F (37.1 C), Min:98.4 F (36.9 C), Max:99.5 F (37.5 C)  Recent Labs  Lab 04/27/19 1147 04/27/19 1241 04/28/19 0521 04/29/19 0601 04/30/19 0531 04/30/19 1008 04/30/19 1341 05/01/19 0528 05/01/19 0624  WBC 15.1*  --  11.7* 10.3 10.6*  --   --   --  9.3  CREATININE 0.42*  --  0.41* 0.36* <0.30*  --   --  0.32*  --   LATICACIDVEN  --  1.5 1.1  --   --   --   --   --   --   VANCOTROUGH  --   --   --   --   --   --  17  --   --   VANCOPEAK  --   --   --   --   --  24*  --   --   --     Estimated Creatinine Clearance: 258.8 mL/min (A) (by C-G formula based on SCr of 0.32 mg/dL (L)).    Allergies  Allergen Reactions  . Morphine Hives    Patient has tolerated oxycodone at high amounts during Nov 2020 hospitalization  . Benzonatate Other (See Comments)    Antimicrobials this admission: 1/24 cefepime >>  1/24 vancomycin  >>   Microbiology results: 1/23 BCx: GPC 1/4 (Diphtheroids) 1/23 UCx: multiple species; suggest recollection    Thank you for allowing pharmacy to be a part of this patient's care.  Joanne Brander L, PharmD, BCPS 05/01/2019 7:44 PM

## 2019-05-01 NOTE — Consult Note (Signed)
Consultation Note Date: 05/01/2019   Patient Name: Paul Hall  DOB: 1995/08/04  MRN: [id number]  Age / Sex: 24 y.o., male  PCP: Ginger Organ Referring Physician: Ezekiel Slocumb, DO  Reason for Consultation: Establishing goals of care  HPI/Patient Profile: 24 y.o. male  with past medical history of stroke with residual quadriplegia, seizures, subdural hematoma, bilateral lower extremity DVT s/p IVC filter placement, urinary retention requiring indwelling catheter, hypertension, diabetes, tracheostomy/peg placement, recent COVID infection with ARDS requiring ECMO, sacral decubitus admitted on 04/27/2019 with sacral wound infection with sepsis and recommendations for wound vac but no evidence of osteomyelitis.   Clinical Assessment and Goals of Care: I met today at [name]'s bedside with his wife, Teodora Medici, and his mother, Abigail Butts (by phone). They tell me about how fun loving [name] is and how he was very involved in ministry and many extracurricular activites. They are very community oriented and very spiritual family.   His wife, mother, father, and brother all care for him at home. They are prepared to learn anything needed to continue caring for him at home. They are also open to any and all help at home that they can have to support them. They tell me that he was tolerating speaking valve at home (and asking about needing humidity at home - they feel his secretions were better managed with humidity). He was unable to speak but was starting to have noises through speaking valve and also noticed him move his lips on a couple occasions. They report he is much more alert at home. They tell me that he was progressing with SLP and able to drink and even eat soft foods. Beginning to move R > L extremities. When turning he began to cross his arms across his chest as well on his own.   They have noted how far he  has come and are happy to have him home with them. They are hoping for continued improvement but also know that this will be a long road with complications along the way. We discussed that only time will let us know where we end up. They would like to continue with SLP and PT and will continue to work with him at home.   All questions/concerns addressed. Emotional support provided.   I discussed with Sarah CSW, Dr. Arbutus Ped, and Velna Hatchet RN.   Primary Decision Maker NEXT OF KIN wife Jaylin    SUMMARY OF RECOMMENDATIONS   - Continue aggressive care with hopes of continued improvement  Code Status/Advance Care Planning:  Full code   Symptom Management:   Per attending.   Will need neurology outpatient involvement to make recommendations for Keppra dosing.   No current symptoms to manage. He appears to be resting comfortably.   Palliative Prophylaxis:   Aspiration, Bowel Regimen, Delirium Protocol, Frequent Pain Assessment, Oral Care and Turn Reposition  Additional Recommendations (Limitations, Scope, Preferences):  Full Scope Treatment  Psycho-social/Spiritual:   Desire for further Chaplaincy support:no  Additional Recommendations: Caregiving  Support/Resources and Referral to  Community Resources   Prognosis:   Unable to determine. He is currently stable but has serious chronic health conditions that makes him at risk for acute decline and decompensation.   Discharge Planning: Home with Home Health and outpatient palliative      Primary Diagnoses: Present on Admission: . Sepsis ([place]) . Decubitus ulcer of sacral region, unstageable ([place])   I have reviewed the medical record, interviewed the patient and family, and examined the patient. The following aspects are pertinent.  Past Medical History:  Diagnosis Date  . Diabetes mellitus ([place])   . Hypertension   . Stroke [facility])    Social History   Socioeconomic History  . Marital status: Married    Spouse name:  Not on file  . Number of children: Not on file  . Years of education: Not on file  . Highest education level: Not on file  Occupational History  . Not on file  Tobacco Use  . Smoking status: Never Smoker  . Smokeless tobacco: Never Used  Substance and Sexual Activity  . Alcohol use: No  . Drug use: No  . Sexual activity: Not on file  Other Topics Concern  . Not on file  Social History Narrative  . Not on file   Social Determinants of Health   Financial Resource Strain:   . Difficulty of Paying Living Expenses: Not on file  Food Insecurity:   . Worried About Charity fundraiser in the Last Year: Not on file  . Ran Out of Food in the Last Year: Not on file  Transportation Needs:   . Lack of Transportation (Medical): Not on file  . Lack of Transportation (Non-Medical): Not on file  Physical Activity:   . Days of Exercise per Week: Not on file  . Minutes of Exercise per Session: Not on file  Stress:   . Feeling of Stress : Not on file  Social Connections:   . Frequency of Communication with Friends and Family: Not on file  . Frequency of Social Gatherings with Friends and Family: Not on file  . Attends Religious Services: Not on file  . Active Member of Clubs or Organizations: Not on file  . Attends Archivist Meetings: Not on file  . Marital Status: Not on file   History reviewed. No pertinent family history. Scheduled Meds: . aspirin EC  81 mg Oral Daily  . Chlorhexidine Gluconate Cloth  6 each Topical Daily  . enoxaparin (LOVENOX) injection  40 mg Subcutaneous Q12H  . famotidine  20 mg Per Tube BID  . free water  100 mL Per Tube Q4H  . insulin aspart  0-20 Units Subcutaneous TID WC  . levETIRAcetam  1,500 mg Per Tube BID  . magnesium oxide  800 mg Per Tube Daily  . mouth rinse  15 mL Mouth Rinse q12n4p  . pneumococcal 23 valent vaccine  0.5 mL Intramuscular Tomorrow-1000   Continuous Infusions: . sodium chloride 10 mL/hr at 05/01/19 0000  . ceFEPime  (MAXIPIME) IV 2 g (05/01/19 0705)  . feeding supplement (GLUCERNA 1.5 CAL) 80 mL/hr at 05/01/19 0600  . vancomycin 1,500 mg (05/01/19 0930)   PRN Meds:.sodium chloride, acetaminophen **OR** acetaminophen, ondansetron **OR** ondansetron (ZOFRAN) IV Allergies  Allergen Reactions  . Morphine Hives    Patient has tolerated oxycodone at high amounts during Nov 2020 hospitalization  . Benzonatate Other (See Comments)   Review of Systems  Unable to perform ROS: Acuity of condition    Physical Exam Vitals  and nursing note reviewed.  Constitutional:      General: He is not in acute distress.    Appearance: He is ill-appearing.     Comments: Minimally responsive. He makes facial expressions only during my visit. No eye opening.   Cardiovascular:     Rate and Rhythm: Tachycardia present.  Pulmonary:     Effort: Pulmonary effort is normal. No tachypnea, accessory muscle usage or respiratory distress.     Comments: Trach to humidity Abdominal:     General: Abdomen is flat.     Palpations: Abdomen is soft.  Neurological:     Comments: Minimally responsive     Vital Signs: BP (!) 101/57   Pulse 100   Temp 99.5 F (37.5 C) (Axillary)   Resp (!) 24   Ht '5\' 10"'  (1.778 m)   Wt (!) 209 kg   SpO2 100%   BMI 66.11 kg/m  Pain Scale: CPOT       SpO2: SpO2: 100 % O2 Device:SpO2: 100 % O2 Flow Rate: .O2 Flow Rate (L/min): 5 L/min  IO: Intake/output summary:   Intake/Output Summary (Last 24 hours) at 05/01/2019 0933 Last data filed at 05/01/2019 0600 Gross per 24 hour  Intake 1246 ml  Output 1940 ml  Net -694 ml    LBM: Last BM Date: 05/01/19 Baseline Weight: Weight: (!) 164.2 kg Most recent weight: Weight: (!) 209 kg     Palliative Assessment/Data:     Time In: 1400 Time Out: 1510 Time Total: 70 min Greater than 50%  of this time was spent counseling and coordinating care related to the above assessment and plan.  Signed by: Vinie Sill, NP Palliative Medicine  Team Pager # [phone] (M-F 8a-5p) Team Phone # [phone] (Nights/Weekends)

## 2019-05-01 NOTE — Progress Notes (Signed)
Pt with wound Vac to sacral wound with approximately of serosanguineous fluid out during shift 7P-7a.

## 2019-05-01 NOTE — Consult Note (Signed)
WOC Nurse wound follow up Wound type:stage 4 sacral wound.  NPWT dressing changed and bridged to left trochanter.  Wound bed: Ruddy red Drainage (amount, consistency, odor) moderate bleeding. No odor.  Periwound: MARSI to periwound.  Skin is protected with barrier ring.  Dressing procedure/placement/frequency:1 piece black foam to wound, 1 piece for bridge to trochanter. Seal is immediately achieved at 125.  Change M/W/F WOC team will follow.  Maple Hudson MSN, RN, FNP-BC CWON Wound, Ostomy, Continence Nurse Pager [phone]

## 2019-05-01 NOTE — TOC Progression Note (Addendum)
Transition of Care [facility]) - Progression Note    Patient Details  Name: Paul Hall MRN: [id number] Date of Birth: June 10, 1995  [facility] CM/SW Contact  [name], LCSW Phone Number: 05/01/2019, 9:32 AM  Clinical Narrative: Sent heads up secure email to [employer] representative regarding wound vac. Received call from RN stating patient's wife had questions about getting Medicaid paperwork signed here at the hospital. CSW emailed financial counselor asking if she would call her to answer wife's questions.    3:12 pm: Chip Boer [facility], [facility], [place], and [facility] unable to meet patient's needs or accept his insurance. Left message for [facility]. Discussed with palliative. She mentioned it to the patient's family during their meeting and said they would be willing to learn how to do wound vac changes if needed. Will continue to try and find a home health agency to accept him.  Expected Discharge Plan: Home/Self Care Barriers to Discharge: Barriers Resolved  Expected Discharge Plan and Services Expected Discharge Plan: Home/Self Care   Discharge Planning Services: CM Consult   Living arrangements for the past 2 months: Single Family Home, Skilled Nursing Facility                         Representative spoke with at DME Agency: Spoke with [name] regarding equipment being picked up, he reports that he will take care of it         Representative spoke with at [facility] Agency: [name], [facility]   Social Determinants of Health (SDOH) Interventions    Readmission Risk Interventions No flowsheet data found.

## 2019-05-02 LAB — CULTURE, BLOOD (ROUTINE X 2): Culture: NO GROWTH

## 2019-05-02 LAB — CREATININE, SERUM
Creatinine, Ser: 0.35 mg/dL — ABNORMAL LOW (ref 0.61–1.24)
GFR calc Af Amer: >60 mL/min (ref >60)
GFR calc non Af Amer: >60 mL/min (ref >60)

## 2019-05-02 LAB — GLUCOSE, CAPILLARY
Glucose-Capillary: 190 mg/dL — ABNORMAL HIGH (ref 70–99)
Glucose-Capillary: 167 mg/dL — ABNORMAL HIGH (ref 70–99)
Glucose-Capillary: 181 mg/dL — ABNORMAL HIGH (ref 70–99)
Glucose-Capillary: 201 mg/dL — ABNORMAL HIGH (ref 70–99)

## 2019-05-02 NOTE — Progress Notes (Signed)
Pt had BM, when pt turned wound vac dressing was not in place, could visualize open wound, black foam left in wound, gauze moistened with sterile water packed around foam covered with 2 ABD pads secured with paper tape. Wound vac completely take down.

## 2019-05-02 NOTE — Evaluation (Signed)
Physical Therapy Evaluation Patient Details Name: Paul Hall MRN: [id number] DOB: 1995/07/06 Today's Date: 05/02/2019   History of Present Illness  Paul Hall is a 23yoM who comes to [facility] on 1/23 from home at recommendation of PCP for closer assessment of scaral wound. respiratory failure, failure to wean from ventilator requiring tracheostomy, insulin-dependent diabetes mellitus, shock requiring vasopressors, hypertension, Serratia urinary tract infection, stroke leading to quadriplegia, seizures, subdural hematoma, bilateral lower extremity DVT status post IVC filter placement 02/16/2019, dysphagia status post PEG tube placement, urinary retention with chronic indwelling Foley catheter, COVID-19 infection complicated by ARDS requiring ECMO. Paul Hall in [place] on 01/20/2019 and discharged to acute rehab on 03/15/2019. Pt noted to be in sepsis d/t sacral wound infection. Spouse states patient utilizes passey-muir at home without noted respiratory distress, also reports PMH OSA. Surgical consult 1/24 recommending conservative management of woudns at this time. ENT consult 'Patient is not a candidate for decannulation at this time.' Pt has been home for about 2 weeks PTA, extensive DME in home, but no therapy services due to insurance barriers as pt still trying to apply for medicaid.  Clinical Impression  Pt admitted with above diagnosis. Pt currently with functional limitations due to the deficits listed below (see "PT Problem List"). Upon entry, pt in bed, fluctuating awake cycles, somewhere between 2-3 on [address] Cognition scale. [name] is at bedside and reports cognition to be fairly close to baseline, but as she describes it pt is typically around a [place] Level 3 at home and typically somnolent for large parts of the day. Extensive history collected from wife who details the patients journey from [facility] to [facility] and back to [facility] with  eventual DC to home 2 weeks ago. It is not clear that patient ever had opportunity to work with physical or occupational therapy during extensive hospitalization period. Payer source complications precluded therapy services at home the past two weeks. The pt is somnolent, awake for brief periods at his own accord, but does not rouse to exam or stimulus. It is unclear that pt would be able to participate, but this will be ascertained over the next several days. Largely the main goal will be to provide extensive education to family to allow them to best provide care to the patient upon return to home. Pt will benefit from skilled PT intervention to increase independence and safety with basic mobility in preparation for discharge to the venue listed below.        Follow Up Recommendations Home health PT;Supervision/Assistance - 24 hour    Equipment Recommendations  None recommended by PT    Recommendations for Other Services       Precautions / Restrictions Precautions Precaution Comments: multiple wounds, tube feeder, tracheostomy collar.      Mobility  Bed Mobility Overal bed mobility: Needs Assistance Bed Mobility: Rolling Rolling: +2 for safety/equipment;+2 for physical assistance         General bed mobility comments: +2-3 for rolling in bed, wife reports pt knows to cross arms over chest during rolling.  Transfers                    Ambulation/Gait                Stairs            Wheelchair Mobility    Modified Rankin (Stroke Patients Only)       Balance Overall balance assessment: (Pt is bed bound  with post CVA quadriparesis.)                                           Pertinent Vitals/Pain Pain Assessment: Faces Faces Pain Scale: Hurts even more(some grimacing with mid range limb mobility, particularly hips into flexion, likely painful to extensive sacral ulcer.) Pain Intervention(s): Limited activity within patient's  tolerance;Monitored during session    [place] expects to be discharged to:: Private residence Living Arrangements: Parent;Spouse/significant other(Parents house with wife and adult brother.) Available Help at Discharge: Family Type of Home: House Home Access: Stairs to enter     Home Layout: Two level;Bed/bath upstairs Home Equipment: Hospital bed Additional Comments: mechanical lift (not yet used); pt not on O2 at home    Prior Function Level of Independence: Needs assistance   Gait / Transfers Assistance Needed: Pt non ambulatory; Does not transfer, has been bed bound since October. Mechanical lift in home for 2 weeks never used.  ADL's / Homemaking Assistance Needed: Total care since becoming ill in October; Family has been assisting with all care, turning in bed, ADL, etc.        Hand Dominance   Dominant Hand: Right    Extremity/Trunk Assessment   Upper Extremity Assessment Upper Extremity Assessment: Difficult to assess due to impaired cognition(largely flaccid BUE, soem toe in Rt elbow and Right shoulder resisting external rotation.)    Lower Extremity Assessment Lower Extremity Assessment: Difficult to assess due to impaired cognition(No tone/active mobility of legs seen; normal tone in ankles bilat; Hip mobility somewhat limited due/to tightness of hips/bed positioning limitation.)       Communication   Communication: Expressive difficulties(Pt is non-verbal since falling ill in Oct 2020)  Cognition Arousal/Alertness: Lethargic   Overall Cognitive Status: History of cognitive impairments - at baseline                                 General Comments: No clear indication that patient is able to effectively communicate in a meaningful way, family has been reliant on grimacing and other body language, as well as grunting for communication needs. Suspect heavy cognitive impairment since CVA      General Comments      Exercises      Assessment/Plan    PT Assessment Patient needs continued PT services  PT Problem List Decreased strength;Decreased cognition;Impaired tone;Decreased activity tolerance;Obesity;Decreased knowledge of precautions;Cardiopulmonary status limiting activity;Decreased mobility       PT Treatment Interventions Patient/family education;Functional mobility training;Therapeutic exercise;Therapeutic activities;Cognitive remediation;Neuromuscular re-education    PT Goals (Current goals can be found in the Care Plan section)  Acute Rehab PT Goals Patient Stated Goal: improve ability to care for patient at home PT Goal Formulation: With family Time For Goal Achievement: 05/16/19 Potential to Achieve Goals: Fair    Frequency Min 2X/week   Barriers to discharge        Co-evaluation               AM-PAC PT "6 Clicks" Mobility  Outcome Measure Help needed turning from your back to your side while in a flat bed without using bedrails?: Total Help needed moving from lying on your back to sitting on the side of a flat bed without using bedrails?: Total Help needed moving to and from a bed to a chair (including  a wheelchair)?: Total Help needed standing up from a chair using your arms (e.g., wheelchair or bedside chair)?: Total Help needed to walk in hospital room?: Total Help needed climbing 3-5 steps with a railing? : Total 6 Click Score: 6    End of Session Equipment Utilized During Treatment: Oxygen Activity Tolerance: Patient tolerated treatment well;Patient limited by lethargy Patient left: in bed;with family/visitor present;with call bell/phone within reach Nurse Communication: Mobility status PT Visit Diagnosis: Muscle weakness (generalized) (M62.81);Other symptoms and signs involving the nervous system (R29.898);Apraxia (R48.2);Hemiplegia and hemiparesis Hemiplegia - Right/Left: (bilat) Hemiplegia - dominant/non-dominant: (bilat) Hemiplegia - caused by: Other Nontraumatic  intracranial hemorrhage;Cerebral infarction    Time: 1062-6948 PT Time Calculation (min) (ACUTE ONLY): 57 min   Charges:   PT Evaluation $PT Eval High Complexity: 1 High PT Treatments $Self Care/Home Management: 23-37        4:47 PM, 05/02/19 Rosamaria Lints, PT, DPT Physical Therapist - [facility]  [phone] (ASCOM)    Tarvis Blossom C 05/02/2019, 4:32 PM

## 2019-05-02 NOTE — Progress Notes (Signed)
PROGRESS NOTE    Paul BolderSamuel Hakeem Hall  ZOX:[id number] DOB: 16-Apr-1995 DOA: 04/27/2019  PCP: Shawnie DapperMann, Benjamin L, PA-C    LOS - 5   Brief Narrative:  Medical records reviewed and are as summarized below: Paul BoastSamuel Hakeem Withersis an 24 y.o.malewith medical history significant for respiratory failure, failure to wean from ventilator requiring tracheostomy, insulin-dependent diabetes mellitus, shock requiring vasopressors, hypertension, Serratia urinary tract infection, stroke leading to quadriplegia, seizures, subdural hematoma, bilateral lower extremity DVT status post IVC filter placement 02/16/2019, dysphagia status post PEG tube placement, urinary retention with chronic indwelling Foley catheter, COVID-19 infection complicated by ARDS requiring ECMO and stroke. [facility] [place] discharged to acute rehab on 03/15/2019. According to his wife at the bedside, patient was discharged from the rehab facility a few weeks ago.  Patient is nonverbal and unable to provide any history so history was obtained from chart review andhis wife at the bedside. His wife said patient was brought to the emergency room at the recommendation of his PCP for evaluation of sacral decubitus ulcer. She said he developed a sacral ulcer from his [facility]. She said the patient could not be turned while he was on ECMO and that led to the development of the ulcer. She also said that there has been intermittent drainage from the wound. She also said PCP was concerned about the tracheostomy and said that patient no longer required a tracheostomy wanted it to be removed. She does not report any fever or vomiting. His wife said that patient is nonverbal but sometimes he can move his extremities from side to side.  Subjective 1/28: Patient seen this AM, wife at bedside.  No acute events reported overnight.  Patient appears comfortable and in no distress.  He in  nonverbal as per usual.  Wife inquires about reducing or stopping Keppra.  Says it was started after strokes from Covid, but patient never had seizures.  Also inquires about wound vac education so family can manage it at home if need be.  Assessment & Plan:   Principal Problem:   Sepsis (HCC) Active Problems:   H/O insulin dependent diabetes mellitus   History of stroke   Seizure disorder (HCC)   Decubitus ulcer of sacral region, unstageable (HCC)   Goals of care, counseling/discussion   Palliative care encounter   Sepsis due to unstageable sacral wound infection/leukocytosis: Leukocytosis has improved.  General surgery consulted, no surgery recommended, wound vac placed.  Surgery signed off. No evidence of osteomyelitis on CT pelvis and blood cultures NGTD.  Patient remains tachycardic but this is baseline per his wife (usually HR 110's to 120), and tachypneic at times. --continue empiric Vanc/Zosyn --maintain wound vac --ID consulted for antibiotic recs on discharge, if needed  Hypotension: Initially hypotensive.  BP has improved.  Normotensive without medications. --Hold amlodipine, chlorthalidone and Coreg  Hypokalemia:replaced --monitor BMP daily and replace as needed  Insulin-dependent diabetes mellitus: Continue insulin and monitor glucose levels  History of stroke with quadriplegia: Continue aspirin --continue Keppra for seizure prophylaxis --will discuss with neurology whether Keppra still indicated per wife request  No history of Seizure disorder: Keppra for seizure prevention in setting of stroke(s), per wife.  Dysphagia: Continue enteral nutrition.   Urinary retention with abnormal urinalysis: Chronic indwelling Foley catheter in place.  --Abnormal urinalysis is probably due to indwelling Foley catheter.    Chronic hypoxemic respiratory failure s/p tracheostomy: He is onFiO2 28% and5 L/min oxygen via trach collar. Minimal of 28%  FiO2 is required  for humidity through trach collar per RT.    His wife said he has intermittent oxygen desaturation especially when he is sleeping and they were told in the past that this was from obstructive sleep apnea.   --recommend sleep study in the future and treating OSA  History of bilateral lower extremity DVT status post IVC filter placement: Continue Lovenox for DVT prophylaxis  Hyponatremia: Improved.  Morbid Obesity - BMI 65.7   DVT prophylaxis: Lovenox   Code Status: Full Code  Family Communication: none at bedside  Disposition Plan:  Pending home health set up for wound vac.     Consultants:   General surgery - signed off   Antimicrobials:   Cefepime 1/23 >> TBD  Vancomycin 1/23 >> TBD  Flagyl once 1/23   Objective: Vitals:   05/02/19 1100 05/02/19 1200 05/02/19 1300 05/02/19 1400  BP: 131/71 106/72 125/82 121/81  Pulse: (!) 117 (!) 111 (!) 114 (!) 112  Resp: (!) 30 18 (!) 32 (!) 33  Temp:      TempSrc:      SpO2: 100% 100% 100% 100%  Weight:      Height:        Intake/Output Summary (Last 24 hours) at 05/02/2019 1434 Last data filed at 05/02/2019 0300 Gross per 24 hour  Intake 2280 ml  Output 575 ml  Net 1705 ml   Filed Weights   04/30/19 0452 05/01/19 0500 05/02/19 0329  Weight: (!) 207.7 kg (!) 209 kg (!) 205 kg    Examination:  General exam: awake, alert, no acute distress, morbidly obese HEENT: eyes closed, moist mucus membranes Respiratory system: clear to auscultation anteriorly, no wheezes, rales or rhonchi, normal respiratory effort. Cardiovascular system: normal S1/S2, RRR, L>R lower extremity edema.   Gastrointestinal system: soft, non-tender, non-distended positive bowel sounds. Extremities: all extremities immobile, normal tone, feet in offloading boots Skin: forehead diaphoretic, intact, normal temperature     Data Reviewed: I have personally reviewed following labs and imaging studies  CBC: Recent Labs  Lab 04/27/19 1147  04/28/19 0521 04/29/19 0601 04/30/19 0531 05/01/19 0624  WBC 15.1* 11.7* 10.3 10.6* 9.3  NEUTROABS 10.1*  --  6.7 7.1 5.8  HGB 10.3* 10.2* 10.4* 9.7* 10.3*  HCT 33.0* 33.0* 33.6* 32.0* 34.6*  MCV 85.1 85.3 85.5 85.6 86.7  PLT 434* 375 397 404* 369   Basic Metabolic Panel: Recent Labs  Lab 04/27/19 1147 04/27/19 1147 04/28/19 0521 04/29/19 0601 04/29/19 0620 04/30/19 0531 05/01/19 0528 05/02/19 0419  NA 131*  --  137 137  --  136 138  --   K 3.5  --  3.8 3.1*  --  3.6 3.9  --   CL 88*  --  95* 97*  --  99 101  --   CO2 32  --  30 29  --  29 27  --   GLUCOSE 247*  --  181* 144*  --  190* 172*  --   BUN 12  --  9 6  --  10 9  --   CREATININE 0.42*   < > 0.41* 0.36*  --  <0.30* 0.32* 0.35*  CALCIUM 9.3  --  9.2 9.4  --  9.2 9.2  --   MG  --   --   --   --  1.9 1.7  --   --    < > = values in this interval not displayed.   GFR: Estimated Creatinine Clearance: 255.5 mL/min (  A) (by C-G formula based on SCr of 0.35 mg/dL (L)). Liver Function Tests: Recent Labs  Lab 04/27/19 1147  AST 37  ALT 109*  ALKPHOS 70  BILITOT 0.5  PROT 7.8  ALBUMIN 2.6*   No results for input(s): LIPASE, AMYLASE in the last 168 hours. No results for input(s): AMMONIA in the last 168 hours. Coagulation Profile: Recent Labs  Lab 04/27/19 1241  INR 1.2   Cardiac Enzymes: No results for input(s): CKTOTAL, CKMB, CKMBINDEX, TROPONINI in the last 168 hours. BNP (last 3 results) No results for input(s): PROBNP in the last 8760 hours. HbA1C: No results for input(s): HGBA1C in the last 72 hours. CBG: Recent Labs  Lab 05/01/19 1148 05/01/19 1632 05/01/19 2331 05/02/19 0528 05/02/19 1135  GLUCAP 162* 188* 183* 190* 167*   Lipid Profile: No results for input(s): CHOL, HDL, LDLCALC, TRIG, CHOLHDL, LDLDIRECT in the last 72 hours. Thyroid Function Tests: No results for input(s): TSH, T4TOTAL, FREET4, T3FREE, THYROIDAB in the last 72 hours. Anemia Panel: No results for input(s): VITAMINB12,  FOLATE, FERRITIN, TIBC, IRON, RETICCTPCT in the last 72 hours. Sepsis Labs: Recent Labs  Lab 04/27/19 1241 04/28/19 0521  LATICACIDVEN 1.5 1.1    Recent Results (from the past 240 hour(s))  Blood Culture (routine x 2)     Status: None   Collection Time: 04/27/19 12:43 PM   Specimen: BLOOD  Result Value Ref Range Status   Specimen Description BLOOD LEFT ANTECUBITAL  Final   Special Requests   Final    BOTTLES DRAWN AEROBIC AND ANAEROBIC Blood Culture results may not be optimal due to an inadequate volume of blood received in culture bottles   Culture   Final    NO GROWTH 5 DAYS Performed at [facility], [address] [place], [place] [address]    Report Status 05/02/2019 FINAL  Final  Blood Culture (routine x 2)     Status: Abnormal   Collection Time: 04/27/19 12:43 PM   Specimen: BLOOD  Result Value Ref Range Status   Specimen Description   Final    BLOOD BLOOD RIGHT FOREARM Performed at [facility], [address], [place], [place] [address]    Special Requests   Final    BOTTLES DRAWN AEROBIC AND ANAEROBIC Blood Culture adequate volume Performed at [facility], [address] [place], [place] [address]    Culture  Setup Time   Final    AEROBIC BOTTLE ONLY GRAM POSITIVE RODS CRITICAL RESULT CALLED TO, READ BACK BY AND VERIFIED WITH: SCOTT HALL ON 04/28/19 AT [address] [facility] Performed at [facility], [address] [place], [place] [address]    Culture (A)  Final    DIPHTHEROIDS(CORYNEBACTERIUM SPECIES) Standardized susceptibility testing for this organism is not available. Performed at [facility], [address] [place], [place] [address]    Report Status 05/01/2019 FINAL  Final  Urine culture     Status: Abnormal   Collection Time: 04/27/19 12:43 PM   Specimen: In/Out Cath Urine  Result Value Ref Range Status   Specimen Description   Final    IN/OUT CATH URINE Performed at [facility], [address] [place], [place] [address]    Special Requests   Final    NONE Performed at [facility], [address] [place], [place] [address]    Culture MULTIPLE SPECIES PRESENT, SUGGEST RECOLLECTION (A)  Final   Report Status 04/29/2019 FINAL  Final  Respiratory Panel by RT  PCR (Flu A&B, Covid) -     Status: None   Collection Time: 04/27/19 12:43 PM  Result Value Ref Range Status   SARS Coronavirus 2 by RT PCR NEGATIVE NEGATIVE Final    Comment: (NOTE) SARS-CoV-2 target nucleic acids are NOT DETECTED. The SARS-CoV-2 RNA is generally detectable in upper respiratoy specimens during the acute phase of infection. The lowest concentration of SARS-CoV-2 viral copies this assay can detect is 131 copies/mL. A negative result does not preclude SARS-Cov-2 infection and should not be used as the sole basis for treatment or other patient management decisions. A negative result may occur with  improper specimen collection/handling, submission of specimen other than nasopharyngeal swab, presence of viral mutation(s) within the areas targeted by this assay, and inadequate number of viral copies (<131 copies/mL). A negative result must be combined with clinical observations, patient history, and epidemiological information. The expected result is Negative. Fact Sheet for Patients:  https://www.moore.com/ Fact Sheet for Healthcare Providers:  https://www.young.biz/ This test is not yet ap proved or cleared by the [place] FDA and  has been authorized for detection and/or diagnosis of SARS-CoV-2 by FDA under an Emergency Use Authorization (EUA). This EUA will remain  in effect (meaning this test can be used) for the duration of the COVID-19 declaration under Section 564(b)(1) of the Act, 21 U.S.C. section 360bbb-3(b)(1), unless the authorization is terminated or revoked sooner.    Influenza A by PCR NEGATIVE NEGATIVE Final   Influenza B by PCR NEGATIVE  NEGATIVE Final    Comment: (NOTE) The Xpert Xpress SARS-CoV-2/FLU/RSV assay is intended as an aid in  the diagnosis of influenza from Nasopharyngeal swab specimens and  should not be used as a sole basis for treatment. Nasal washings and  aspirates are unacceptable for Xpert Xpress SARS-CoV-2/FLU/RSV  testing. Fact Sheet for Patients: https://www.moore.com/ Fact Sheet for Healthcare Providers: https://www.young.biz/ This test is not yet approved or cleared by the [place] FDA and  has been authorized for detection and/or diagnosis of SARS-CoV-2 by  FDA under an Emergency Use Authorization (EUA). This EUA will remain  in effect (meaning this test can be used) for the duration of the  Covid-19 declaration under Section 564(b)(1) of the Act, 21  U.S.C. section 360bbb-3(b)(1), unless the authorization is  terminated or revoked. Performed at [facility], [address] [place], [place] [address]   C difficile quick scan w PCR reflex     Status: None   Collection Time: 04/27/19  1:10 PM   Specimen: Stool  Result Value Ref Range Status   C Diff antigen NEGATIVE NEGATIVE Final   C Diff toxin NEGATIVE NEGATIVE Final   C Diff interpretation No C. difficile detected.  Final    Comment: Performed at [facility], [address] [place], [place] [address]  MRSA PCR Screening     Status: None   Collection Time: 04/29/19  3:47 PM   Specimen: Nasopharyngeal  Result Value Ref Range Status   MRSA by PCR NEGATIVE NEGATIVE Final    Comment:        The GeneXpert MRSA Assay (FDA approved for NASAL specimens only), is one component of a comprehensive MRSA colonization surveillance program. It is not intended to diagnose MRSA infection nor to guide or monitor treatment for MRSA infections. Performed at [facility], [address], [place], [place] [address]          Radiology Studies: No results  found.      Scheduled Meds: .  aspirin EC  81 mg Oral Daily  . Chlorhexidine Gluconate Cloth  6 each Topical Daily  . enoxaparin (LOVENOX) injection  40 mg Subcutaneous Q12H  . famotidine  20 mg Per Tube BID  . free water  100 mL Per Tube Q4H  . hydrogen peroxide   Topical BID  . insulin aspart  0-20 Units Subcutaneous Q6H  . levETIRAcetam  1,500 mg Per Tube BID  . magnesium oxide  800 mg Per Tube Daily  . mouth rinse  15 mL Mouth Rinse q12n4p  . pneumococcal 23 valent vaccine  0.5 mL Intramuscular Tomorrow-1000   Continuous Infusions: . sodium chloride Stopped (05/01/19 0705)  . ceFEPime (MAXIPIME) IV 2 g (05/02/19 0910)  . feeding supplement (GLUCERNA 1.5 CAL) 1,000 mL (05/02/19 0233)  . vancomycin 2,000 mg (05/02/19 1134)     LOS: 5 days    Time spent: 30-35 minutes    Ezekiel Slocumb, DO [facility]   If 7PM-7AM, please contact night-coverage [email or web address] 05/02/2019, 2:34 PM

## 2019-05-02 NOTE — Progress Notes (Signed)
Palliative:  HPI: 24 y.o. male  with past medical history of stroke with residual quadriplegia, seizures, subdural hematoma, bilateral lower extremity DVT s/p IVC filter placement, urinary retention requiring indwelling catheter, hypertension, diabetes, tracheostomy/peg placement, recent COVID infection with ARDS requiring ECMO, sacral decubitus admitted on 04/27/2019 with sacral wound infection with sepsis and recommendations for wound vac but no evidence of osteomyelitis.    I met today at [facility] bedside along with wife, Paul Hall. He is attempting to open his eyes more today. His wound vac is more cloudy and purulent today - I pointed out this difference to wife. Otherwise he remains stable but per family not as alert as he was at home. I have requested SLP and PT to see per family request as they report they were seeing improvement at home.   CSW is working to find assistance at home for SLP, PT, and [facility] to do wound vac dressing changes as well as foley catheter changes. She is having a difficult time finding an agency to accept him. I have requested follow up from outpatient palliative to have a consistent provider to follow and build rapport as this will be a long journey for Paul Hall and his family.   Wife asks about consideration of titrating down Keppra dosage, if he needs sliding scale insulin, and sleep study. I recommend that she discuss with PCP for advice. Also recommend referral for outpatient palliative support from PCP as well.   All questions/concerns addressed. Emotional support provided. Please call for any further palliative needs [phone]. Thank you for this consult for this lovely family.   Exam: Will open eyes briefly but not to command. Not following any commands. No distress. Warm to touch. VSS. Breathing regular, unlabored. Abd soft.   Plan: - Family plans to take him home and continue to care for him there.  - They will need guidance on wound care and recommendations. CSW  struggling to find services to support them at home.  - Recommend continued palliative follow up as outpatient.   25 min  Vinie Sill, NP Palliative Medicine Team Pager [phone] (Please see [email or web address] for schedule) Team Phone [phone]    Greater than 50%  of this time was spent counseling and coordinating care related to the above assessment and plan

## 2019-05-02 NOTE — TOC Progression Note (Signed)
Transition of Care [facility]) - Progression Note    Patient Details  Name: Paul Hall MRN: [id number] Date of Birth: [birth year]  Transition of Care [facility] CM/SW Contact  [name], LCSW Phone Number: 05/02/2019, 4:28 PM  Clinical Narrative: CSW has been unable to find a home health agency to accept patient for any services. Discussed with director of [facility] dept and he will call patient's insurance company to see if they can assist. CSW has updated patient's wife. Made outpatient palliative referral to [facility].    Expected Discharge Plan: Home/Self Care Barriers to Discharge: Barriers Resolved  Expected Discharge Plan and Services Expected Discharge Plan: Home/Self Care   Discharge Planning Services: CM Consult   Living arrangements for the past 2 months: Single Family Home, Skilled Nursing Facility                         Representative spoke with at DME Agency: Spoke with [name] regarding equipment being picked up, he reports that he will take care of it         Representative spoke with at [facility] Agency: [name], [facility]   Social Determinants of Health (SDOH) Interventions    Readmission Risk Interventions No flowsheet data found.

## 2019-05-03 LAB — GLUCOSE, CAPILLARY
Glucose-Capillary: 162 mg/dL — ABNORMAL HIGH (ref 70–99)
Glucose-Capillary: 169 mg/dL — ABNORMAL HIGH (ref 70–99)
Glucose-Capillary: 206 mg/dL — ABNORMAL HIGH (ref 70–99)
Glucose-Capillary: 247 mg/dL — ABNORMAL HIGH (ref 70–99)

## 2019-05-03 NOTE — Evaluation (Addendum)
Passy-Muir Speaking Valve - Evaluation Patient Details  Name: Paul Hall MRN: [id number] Date of Birth: [birth year]  Today's Date: 05/03/2019 Time: 0920-1005 SLP Time Calculation (min) (ACUTE ONLY): 45 min  Past Medical History:  Past Medical History:  Diagnosis Date  . Diabetes mellitus (HCC)   . Hypertension   . Stroke [facility])    Past Surgical History:  Past Surgical History:  Procedure Laterality Date  . IVC FILTER PLACEMENT ([facility] HX)    . LEG SURGERY    . PEG TUBE PLACEMENT    . TRACHEOSTOMY     HPI:  Pt is a 23yoM who comes to [facility] on 1/23 from home at recommendation of PCP for closer assessment of scaral wound. Pt has a PMH of Morbid Obesity, respiratory failure, failure to wean from ventilator requiring tracheostomy, insulin-dependent diabetes mellitus, shock requiring vasopressors, hypertension, Serratia urinary tract infection, stroke leading to quadriplegia, seizures, subdural hematoma, bilateral lower extremity DVT status post IVC filter placement 02/16/2019, dysphagia during hospitalization requring PEG tube placement, urinary retention with chronic indwelling Foley catheter, COVID-19 infection complicated by ARDS requiring ECMO. Pt was admitted to [facility] in [place] on 01/20/2019 Was admitted in 01/2019 with respiratory distress secondary to COVID infection requiring ECMO. Underwent tracheostomy for failure to wean from vent. Patient has also suffered CVA at that time w/ subdural hematoma, and now has functional quadriplegia per records. He was discharged to acute rehab on 03/15/2019 and recently discharged home in past ~2 weeks.  Currently, pt noted to be in sepsis d/t sacral wound infection. Spouse states patient has tolerated wear of passey-muir at home without noted respiratory distress; also reports PMH OSA. Surgical consult 1/24 recommending conservative management of woudns at this time. ENT consult 'Patient is not a candidate for decannulation at this  time.' Pt has been home for about 2 weeks PTA, extensive DME in home, but no therapy services due to insurance barriers as pt still trying to apply for Medicaid. Pt is Nonverbal; not following instructions this hospitalization per NSG/notes.   Assessment / Plan / Recommendation Clinical Impression  Pt seen today for PMV assessment to determine if able to wear/tolerate PMV as had been wearing at home prior to hospitalization; for attempts w/ verbal communication w/ others, family. Wife stated pt had been working on wearing a PMV during Rehab timeframe approx. Dec-Jan when discharged home; and at home w/ family support. Pt has had a chronic trach since ~01/2019 per chart notes. Pt has a Shiley #8 uncuffed at baseline. Pt is on TC O2 support; 5L. Pt positioned more upright for assessment; for easier breathing. Pt was nonverbal; eyes closed most of the assessment w/ no intentional engagement noted by NSG and SLP when giving Mod-Max verbal/tactile stimuation. O2 sats 100%, HR 111, RR 24. Finger occlusion attempted w/ No phonation/verbal engagement to assess vocal cord status. Adequate redirection of airflow superiorly appreciated but noted airflow at the level of the trach, humidity in b/t trials and TC replaced. PMV placement followed w/out issue w/ placement, Pt did not indicate any immediate overt s/s of discomfort breathing or distress w/ placement. Noted pt's vitals to remain WFL; O2 sats remained 100% and RR 18-21. Intermittently, PMV was removed to assess if pt seemed to breath more comfortably - a small rush of air noted at the level of trach w/ humidity in TC. Unable to fully assess superior air movements d/t pt's lack of engagement, alertness for participation. SLP attempted to feel air movement at level of mouth but  pt's mouth remained closed the majority of assessment. PMV remained in place for ~15 mins. During placement, no significant body/head movements noted but at that timeframe, pt seemed fidgity  and d/t uncertainty if this was a sign of discomfort, PMV was removed for pt to rest. O2 sats and HR/RR reamined at baseline during/post session. Pt appears to tolerate placement/wear of the PMV for short periods of time. Cannot be fully certain of pt's comfort of breathing w/ PMV placed d/t Mental/Cognitive status. However, pt does have a Shiley #8 trach in place, and this diameter can impede airflow superiorly causing discomfort of exhalation. Recommend an ENT assessment to determine appropriateness of downsizing to a Shiley #6 trach in order to aid airflow around the trach for PMV tolerance (comfortably).  Recommend STRICT Supervision of any wear/use of PMV and only wear for ~15 mins at a time. Recommmend f/u w/ ENT for assessment of downsizing trach to see if this would allow easier superior airflow for phonation/verbalization in the future. This assessment and recommendation was discussed w/ pt's Wife via phone, NSG/MD who agreed. PMV precautions posted in chart and at bedside; reviewed w/ Wife.  SLP Visit Diagnosis: Aphonia (R49.1)(CVA)    SLP Assessment  Patient needs continued Speech Lanaguage Pathology Services(monitor status while admitted)    Follow Up Recommendations  Home health SLP(to continue services at discharge)    Frequency and Duration min 2x/week  1 week    PMSV Trial PMSV was placed for: 15 mins Able to redirect subglottic air through upper airway: (intermittent phonations noises w/ heavy exhalation) Able to Attain Phonation: No attempt to phonate Voice Quality: (nonverbal) Able to Expectorate Secretions: No attempts Level of Secretion Expectoration with PMSV: (n/a) Breath Support for Phonation: (n/a) Intelligibility: (n/a) Respirations During Trial: 19 SpO2 During Trial: 100 % Pulse During Trial: 106 Behavior: Poor eye contact;No attempt to communicate   Tracheostomy Tube  Additional Tracheostomy Tube Assessment Trach Collar Period: at baseline Secretion  Description: minimal Frequency of Tracheal Suctioning: prn Level of Secretion Expectoration: Tracheal    Vent Dependency  Vent Dependent: No FiO2 (%): 28 %    Cuff Deflation Trial  GO Tolerated Cuff Deflation: (uncuffed at baseline) Behavior: Poor eye contact(Not responsive to questions)        Jayshaun Phillips 05/03/2019, 12:55 PM Orinda Kenner, MS, CCC-SLP

## 2019-05-03 NOTE — Progress Notes (Signed)
Video call with mom complete.

## 2019-05-03 NOTE — Progress Notes (Signed)
   05/03/19 1600  Clinical Encounter Type  Visited With Patient  Visit Type Initial  Referral From Chaplain  Consult/Referral To Chaplain  Chaplain visited with patient. Chaplain had just missed seeing patient's wife. Chaplain sat by the bed and silently prayed.

## 2019-05-03 NOTE — Consult Note (Signed)
Paul Hall, Paul Hall [id number] Sep 09, 1995 Sandi Mealy, MD  Reason for Consult: trach change Requesting Physician: Pennie Banter, DO Consulting Physician: Sandi Mealy, MD  HPI: This 24 y.o. year old male was admitted on 04/27/2019 for Sepsis [facility]) [A41.9] Skin ulcer of sacrum with necrosis of muscle (HCC) [L98.423] Sepsis, due to unspecified organism, unspecified whether acute organ dysfunction present (HCC) [A41.9]. Patient need chronic trach with h/o respiratory failure and prior stroke, post-COVID, as well as morbid obesity, risk for severe OSA. Speech suggested trying a smaller trach for easier use of a PMV. He currently has a #8 Shiley.  Medications:  Current Facility-Administered Medications  Medication Dose Route Frequency Provider Last Rate Last Admin  . 0.9 %  sodium chloride infusion   Intravenous PRN Lurene Shadow, MD   Stopped at 05/01/19 [phone]  . acetaminophen (TYLENOL) tablet 650 mg  650 mg Oral Q6H PRN Lurene Shadow, MD   650 mg at 04/29/19 0315   Or  . acetaminophen (TYLENOL) suppository 650 mg  650 mg Rectal Q6H PRN Lurene Shadow, MD      . aspirin EC tablet 81 mg  81 mg Oral Daily Lurene Shadow, MD   81 mg at 05/03/19 9417  . ceFEPIme (MAXIPIME) 2 g in sodium chloride 0.9 % 100 mL IVPB  2 g Intravenous Q8H Lurene Shadow, MD 200 mL/hr at 05/03/19 1511 2 g at 05/03/19 1511  . Chlorhexidine Gluconate Cloth 2 % PADS 6 each  6 each Topical Daily Lurene Shadow, MD   6 each at 05/03/19 [phone]  . enoxaparin (LOVENOX) injection 40 mg  40 mg Subcutaneous Q12H Lurene Shadow, MD   40 mg at 05/03/19 4481  . famotidine (PEPCID) tablet 20 mg  20 mg Per Tube BID Bertram Savin, RPH   20 mg at 05/03/19 8563  . feeding supplement (GLUCERNA 1.5 CAL) liquid 1,000 mL  1,000 mL Per Tube Continuous Lurene Shadow, MD 80 mL/hr at 05/03/19 1200 Rate Verify at 05/03/19 1200  . free water 100 mL  100 mL Per Tube Q4H Lurene Shadow, MD   100 mL at 05/03/19 1600  . hydrogen peroxide 3 %  external solution   Topical BID Erin Fulling, MD   Given at 05/03/19 [phone]  . insulin aspart (novoLOG) injection 0-20 Units  0-20 Units Subcutaneous Q6H Manuela Schwartz, NP   7 Units at 05/03/19 1722  . levETIRAcetam (KEPPRA) 100 MG/ML solution 1,500 mg  1,500 mg Per Tube BID Lurene Shadow, MD   1,500 mg at 05/03/19 1030  . magnesium oxide (MAG-OX) tablet 800 mg  800 mg Per Tube Daily Lurene Shadow, MD   800 mg at 05/03/19 0263  . MEDLINE mouth rinse  15 mL Mouth Rinse q12n4p Lurene Shadow, MD   15 mL at 05/03/19 1600  . ondansetron (ZOFRAN) tablet 4 mg  4 mg Oral Q6H PRN Lurene Shadow, MD       Or  . ondansetron (ZOFRAN) injection 4 mg  4 mg Intravenous Q6H PRN Lurene Shadow, MD      . pneumococcal 23 valent vaccine (PNEUMOVAX-23) injection 0.5 mL  0.5 mL Intramuscular Tomorrow-1000 Lurene Shadow, MD      . vancomycin (VANCOREADY) IVPB 2000 mg/400 mL  2,000 mg Intravenous Q12H Bertram Savin, RPH 200 mL/hr at 05/03/19 1013 2,000 mg at 05/03/19 1013  .  Medications Prior to Admission  Medication Sig Dispense Refill  . amLODipine (NORVASC) 5 MG tablet Take 10 mg by mouth daily.    . ASPIRIN  LOW DOSE 81 MG EC tablet Take 81 mg by mouth daily.    . carvedilol (COREG) 25 MG tablet Take 25 mg by mouth every evening.     . [EXPIRED] cephALEXin (KEFLEX) 250 MG/5ML suspension Take 500 mg by mouth 4 (four) times daily.    . chlorthalidone (HYGROTON) 25 MG tablet Take 50 mg by mouth daily as needed (BP >140/90).     . enoxaparin (LOVENOX) 40 MG/0.4ML injection Inject 40 mg into the skin daily.    . famotidine (PEPCID) 20 MG tablet Take 20 mg by mouth 2 (two) times daily.    Marland Kitchen levETIRAcetam (KEPPRA) 500 MG tablet Take 1,500 mg by mouth 2 (two) times daily.    . magnesium oxide (MAG-OX) 400 MG tablet Place 800 mg into feeding tube daily.    Marland Kitchen NOVOLIN R 100 UNIT/ML injection Inject 20 Units into the skin 3 (three) times daily.     . Nutritional Supplements (GLUCERNA 1.5 CAL PO) Take 1 Can by  mouth See admin instructions. Take 1 carton via tube up to 8 times daily for nutrition    . psyllium (HYDROCIL/METAMUCIL) 95 % PACK Take 1 packet by mouth 2 (two) times daily.    Marland Kitchen SANTYL ointment Apply 1 application topically as directed.      Allergies:  Allergies  Allergen Reactions  . Morphine Hives    Patient has tolerated oxycodone at high amounts during Nov 2020 hospitalization  . Benzonatate Other (See Comments)    PMH:  Past Medical History:  Diagnosis Date  . Diabetes mellitus ([place])   . Hypertension   . Stroke [facility])     Fam Hx: History reviewed. No pertinent family history.  Soc Hx:  Social History   Socioeconomic History  . Marital status: Married    Spouse name: Not on file  . Number of children: Not on file  . Years of education: Not on file  . Highest education level: Not on file  Occupational History  . Not on file  Tobacco Use  . Smoking status: Never Smoker  . Smokeless tobacco: Never Used  Substance and Sexual Activity  . Alcohol use: No  . Drug use: No  . Sexual activity: Not on file  Other Topics Concern  . Not on file  Social History Narrative  . Not on file   Social Determinants of Health   Financial Resource Strain:   . Difficulty of Paying Living Expenses: Not on file  Food Insecurity:   . Worried About Charity fundraiser in the Last Year: Not on file  . Ran Out of Food in the Last Year: Not on file  Transportation Needs:   . Lack of Transportation (Medical): Not on file  . Lack of Transportation (Non-Medical): Not on file  Physical Activity:   . Days of Exercise per Week: Not on file  . Minutes of Exercise per Session: Not on file  Stress:   . Feeling of Stress : Not on file  Social Connections:   . Frequency of Communication with Friends and Family: Not on file  . Frequency of Social Gatherings with Friends and Family: Not on file  . Attends Religious Services: Not on file  . Active Member of Clubs or Organizations: Not on  file  . Attends Archivist Meetings: Not on file  . Marital Status: Not on file  Intimate Partner Violence:   . Fear of Current or Ex-Partner: Not on file  . Emotionally Abused: Not on file  .  Physically Abused: Not on file  . Sexually Abused: Not on file    PSH:  Past Surgical History:  Procedure Laterality Date  . IVC FILTER PLACEMENT ([facility] HX)    . LEG SURGERY    . PEG TUBE PLACEMENT    . TRACHEOSTOMY    . Procedures since admission: No admission procedures for hospital encounter.  ROS: Review of systems normal other than 12 systems except per HPI.  PHYSICAL EXAM  Vitals: Blood pressure 127/81, pulse (!) 115, temperature 98.4 F (36.9 C), temperature source Oral, resp. rate (!) 36, height 5\' 10"  (1.778 m), weight (!) 209.6 kg, SpO2 100 %.. General: Morbidly obese male in no acute distress Mood: Cooperative, nonverbal. Orientation: Not oriented. Vocal Quality: Nonverbal head and Face: NCAT. No facial asymmetry. No visible skin lesions. No significant facial scars. No tenderness with sinus percussion. Facial strength normal and symmetric. Ears: External ears with normal landmarks, no lesions.  Hearing: Speech reception unable to assess Neck: Supple and symmetric with no palpable masses, tenderness or crepitance. The trachea is midline. Thyroid gland is soft, nontender and symmetric with no masses or enlargement. Parotid and submandibular glands are soft, nontender and symmetric, without masses. #8 cuffless, fenestrated Shiley in place Lymphatic: Cervical lymph nodes are without palpable lymphadenopathy or tenderness. Respiratory: Normal respiratory effort without labored breathing. Cardiovascular: Carotid pulse shows regular rate and rhythm  MEDICAL DECISION MAKING: Data Review:  Results for orders placed or performed during the hospital encounter of 04/27/19 (from the past 48 hour(s))  Glucose, capillary     Status: Abnormal   Collection Time: 05/01/19 11:31 PM   Result Value Ref Range   Glucose-Capillary 183 (H) 70 - 99 mg/dL  Creatinine, serum     Status: Abnormal   Collection Time: 05/02/19  4:19 AM  Result Value Ref Range   Creatinine, Ser 0.35 (L) 0.61 - 1.24 mg/dL   GFR calc non Af Amer >60 >60 mL/min   GFR calc Af Amer >60 >60 mL/min    Comment: Performed at [facility], [address] [place], [place]  Glucose, capillary     Status: Abnormal   Collection Time: 05/02/19  5:28 AM  Result Value Ref Range   Glucose-Capillary 190 (H) 70 - 99 mg/dL  Glucose, capillary     Status: Abnormal   Collection Time: 05/02/19 11:35 AM  Result Value Ref Range   Glucose-Capillary 167 (H) 70 - 99 mg/dL  Glucose, capillary     Status: Abnormal   Collection Time: 05/02/19  6:20 PM  Result Value Ref Range   Glucose-Capillary 181 (H) 70 - 99 mg/dL  Glucose, capillary     Status: Abnormal   Collection Time: 05/02/19 11:06 PM  Result Value Ref Range   Glucose-Capillary 201 (H) 70 - 99 mg/dL  Glucose, capillary     Status: Abnormal   Collection Time: 05/03/19  5:21 AM  Result Value Ref Range   Glucose-Capillary 162 (H) 70 - 99 mg/dL  Glucose, capillary     Status: Abnormal   Collection Time: 05/03/19 11:51 AM  Result Value Ref Range   Glucose-Capillary 169 (H) 70 - 99 mg/dL  Glucose, capillary     Status: Abnormal   Collection Time: 05/03/19  5:04 PM  Result Value Ref Range   Glucose-Capillary 206 (H) 70 - 99 mg/dL  . No results found..   ASSESSMENT: Status post trach for prior respiratory failure, morbid obesity  PLAN: Trach downsized to #6 per speech suggestion to facilitate  use of PMV. Would not recommend smaller than #6. Janina Mayo has disposable inner cannula which may facilitate easier care   Sandi Mealy, MD 05/03/2019 6:13 PM

## 2019-05-03 NOTE — Progress Notes (Signed)
SLP Cancellation Note  Patient Details Name: Paul Hall MRN: [id number] DOB: 03-May-1995   Cancelled treatment:       Reason Eval/Treat Not Completed: Patient not medically ready;Medical issues which prohibited therapy(chart reviewed; consulted NSG/MD re: pt's status). Pt was admitted w/ dx of Sepsis due to sacral wound infection/leukocytosis; wound type: stage 4 sacral wound per WOC NSG/chart notes. Any type of ongoing infection certainly impacts pt's presentation from their baseline. Wife stated to Palliative Care services that pt is "not as alert as he was at home". Pt has a baseline of Dysphagia which required a PEG placement back during his previous hospitalization. Per discussion w/ Wife and chart notes, pt had begun working on po trials/intake at [employer] and during discharge home (all occurring in the last ~5 weeks). Discussed w/ Wife that time would be needed to assess pt's status, tolerance for presentation of po intake, and that this would best be done when pt's alertness was much improved -- nearer to his baseline (possibly when medical status/infection has improved) d/t the HIGH risk for aspiration and Pulmonary decline. Wife agreed.  ST services will continue to f/u w/ pt's status next 2-3 days to determine appropriateness of presentation of po trials.  Recommended frequent oral care for hygiene and stimulation of swallowing; aspiration precautions w/ oral care. Discussed w/ Wife. NSG updated.     Jerilynn Som, MS, CCC-SLP Ambrosio Reuter 05/03/2019, 12:58 PM

## 2019-05-03 NOTE — Progress Notes (Signed)
New referral for [employer] community Palliative program follow post discharge received from [facility] [address]. Patient information given to referral. Dayna Barker BSN, RN, [facility] Liaison [facility] [phone]

## 2019-05-03 NOTE — Consult Note (Signed)
WOC Nurse wound follow up Wound type:stage 4 sacral wound.  NPWT dressing changed and bridged to left trochanter.  Left ischial scarring has opened up.  Noted moderate loose stool.  Fecal manager leaked yesterday.  Spoke with surgery team, inquiring about colostomy to promote healing and patient may not be stable enough to tolerate this.  Will defer to their judgement for this .  They plan to see again once able.  Wound bed: Ruddy red with 3 cm x 3 cm adherent slough present.  Boggy and bleeds with cleansing.  Drainage (amount, consistency, odor) moderate bleeding. No odor.  Periwound: MARSI to periwound.  Skin is protected with barrier ring.  Dressing procedure/placement/frequency:1 piece black foam to wound, 1 piece for bridge to trochanter. Seal is immediately achieved at 125.  Change M/W/F WOC team will follow.  Maple Hudson MSN, RN, FNP-BC CWON Wound, Ostomy, Continence Nurse Pager [phone]

## 2019-05-03 NOTE — TOC Progression Note (Addendum)
Transition of Care [facility]) - Progression Note    Patient Details  Name: Paul Hall MRN: [id number] Date of Birth: December 25, 1995  Transition of [facility]) CM/SW Contact  [name], LCSW Phone Number: 05/03/2019, 12:09 PM  Clinical Narrative: Confirmed address with wife: [address], [place]. Left a voicemail for the Team Lead at [facility] that handles discharge services to see if she can assist with getting patient some home health.    3:05 pm: No call back from [facility] yet. Wife aware. She spoke with her sister-in-law that owns a PCS agency but they are very understaffed and only accept Medicaid. CSW inquired if her sister-in-law knew of other agencies she could possibly recommend.  4:07 pm: Wife responded that her sister-in-law recommended [facility]. Wife called them to get referral fax number as well as [facility]. CSW sent her list of home health agencies that we tried to get services through ([facility], [place], [facility], [facility], [facility], [employer], [place], and [place]) explaining that either they aren't in network with his insurance, don't have enough staff, or cannot take trachs.   Expected Discharge Plan: Home/Self Care Barriers to Discharge: Barriers Resolved  Expected Discharge Plan and Services Expected Discharge Plan: Home/Self Care   Discharge Planning Services: CM Consult   Living arrangements for the past 2 months: Single Family Home, Skilled Nursing Facility                         Representative spoke with at DME Agency: Spoke with [name] regarding equipment being picked up, he reports that he will take care of it         Representative spoke with at [facility] Agency: [name], [facility]   Social Determinants of Health (SDOH) Interventions    Readmission Risk Interventions No flowsheet data found.

## 2019-05-03 NOTE — Progress Notes (Signed)
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 He was admitted to [name] in [facility] 10/18/2020and discharged to acute rehab on 03/15/2019. According to his wife at the bedside, patient was discharged from the rehab facility a few weeks ago.  Patient is nonverbal and unable to provide any history so history was obtained from chart review andhis wife at the bedside. His wife said patient was brought to the emergency room at the recommendation of his PCP for evaluation of sacral decubitus ulcer. She saidhe developed a sacral ulcer from his hospitalization at [facility]. She said the patient could not be turned while he was on ECMO and that led to the development of the ulcer. She also said that there has been intermittent drainage from the wound. She also said PCP was concerned about the tracheostomy and said that patient no longer required a tracheostomy wanted it to be removed. She does not report any fever or vomiting. His wife said that patient is nonverbal but sometimes he can move his extremities from side to side.  Subjective 1/29: Patient seen this Am.  No acute events reported.  He is not responsive for me this AM, nonverbal per usual.  Appears in no distress.   Family not at bedside this AM.  Assessment & Plan:   Principal Problem:   Sepsis ([place]) Active Problems:   H/O insulin dependent diabetes mellitus   History of stroke   Seizure disorder ([place])   Decubitus ulcer of sacral region, unstageable ([place])   Goals of care, counseling/discussion   Palliative care encounter   Sepsis due to unstageable sacral woundinfection/leukocytosis: Leukocytosis has resolved.  General surgery consulted, no surgery recommended, wound vac placed.  Surgery signed off. No evidence of osteomyelitis on CT pelvis and blood cultures NGTD.  Patient remains tachycardic but this is baseline per his wife (usually HR 110's to 120), and tachypneic at times. --stop empiric Vanc/Zosyn - completed 7 day course --monitor clinically for s/sx's of infection --maintain wound vac --family needs training on wound care at home, [facility] unable to get [facility] agency covered by patient's insurance.  Hypotension: Initially hypotensive.  BP has improved.  Normotensive without medications. --Hold amlodipine, chlorthalidone and Coreg  Hypokalemia:replaced --monitor BMP daily and replace as needed  Insulin-dependent diabetes mellitus: Continue insulin and monitor glucose levels  History of stroke with quadriplegia: Continue aspirin --continue Keppra for seizure prophylaxis --will discuss with neurology whether Keppra still indicated per wife request  No history of Seizure disorder: Keppra for seizure prevention in setting of stroke(s), per wife.  Dysphagia: Continue enteral nutrition.   Urinary retentionwith abnormal urinalysis: Chronic indwelling Foley catheter in place.  --Abnormal urinalysis is probably due to indwelling Foley catheter.    Chronic hypoxemic respiratory failure s/p tracheostomy: He is onFiO2 28% and5 L/min oxygen via trach collar. Minimal of 28% FiO2 is required for humidity through trach collar per RT.    His wife said he  has intermittent oxygen  desaturation especially when he is sleeping and they were told in the past that this was from obstructive sleep apnea.   --recommend sleep study in the future and treating OSA  History of bilateral lower extremity DVT status post IVC filter placement:  Continue Lovenox for DVT prophylaxis  Hyponatremia: Improved.  Morbid Obesity - BMI 65.7   DVT prophylaxis:Lovenox Code Status: Full Code Family Communication:none at bedside Disposition Plan:Pending home health set up for wound vac.   Consultants:  General surgery - signed off   Antimicrobials:  Cefepime 1/23 >> 1/29  Vancomycin 1/23 >> 1/29  Flagyl once 1/23   Objective: Vitals:   05/03/19 0500 05/03/19 0600 05/03/19 0700 05/03/19 0800  BP: 129/80 129/83 121/81 133/77  Pulse: 98 (!) 109 (!) 106 (!) 109  Resp: (!) 24 (!) 25 11 13   Temp:    98.3 F (36.8 C)  TempSrc:    Oral  SpO2: 100% 100% 100% 100%  Weight:      Height:        Intake/Output Summary (Last 24 hours) at 05/03/2019 0849 Last data filed at 05/03/2019 0800 Gross per 24 hour  Intake 3889.09 ml  Output 1850 ml  Net 2039.09 ml   Filed Weights   05/01/19 0500 05/02/19 0329 05/03/19 0419  Weight: (!) 209 kg (!) 205 kg (!) 209.6 kg    Examination:  General exam: sleeping comfortably, not responsive, no acute distress, morbidly obese HEENT: atraumatic, moist mucus membranes Respiratory system: clear to auscultation anteriorly, no wheezes, rales or rhonchi, normal respiratory effort. Cardiovascular system: normal S1/S2, RRR, L>R lower extremity edema.   Gastrointestinal system: soft, non-tender, non-distended abdomen Central nervous system: unresponsive, does not follow commands, exam limited by patient condition Extremities: offloading boots in place, normal tone Skin: dry, intact, normal temperature Psychiatry: normal mood, congruent affect, judgement and insight appear normal    Data Reviewed: I have personally reviewed  following labs and imaging studies  CBC: Recent Labs  Lab 04/27/19 1147 04/28/19 0521 04/29/19 0601 04/30/19 0531 05/01/19 0624  WBC 15.1* 11.7* 10.3 10.6* 9.3  NEUTROABS 10.1*  --  6.7 7.1 5.8  HGB 10.3* 10.2* 10.4* 9.7* 10.3*  HCT 33.0* 33.0* 33.6* 32.0* 34.6*  MCV 85.1 85.3 85.5 85.6 86.7  PLT 434* 375 397 404* 369   Basic Metabolic Panel: Recent Labs  Lab 04/27/19 1147 04/27/19 1147 04/28/19 0521 04/29/19 0601 04/29/19 0620 04/30/19 0531 05/01/19 0528 05/02/19 0419  NA 131*  --  137 137  --  136 138  --   K 3.5  --  3.8 3.1*  --  3.6 3.9  --   CL 88*  --  95* 97*  --  99 101  --   CO2 32  --  30 29  --  29 27  --   GLUCOSE 247*  --  181* 144*  --  190* 172*  --   BUN 12  --  9 6  --  10 9  --   CREATININE 0.42*   < > 0.41* 0.36*  --  <0.30* 0.32* 0.35*  CALCIUM 9.3  --  9.2 9.4  --  9.2 9.2  --   MG  --   --   --   --  1.9 1.7  --   --    < > = values in this interval not displayed.   GFR: Estimated Creatinine Clearance: 259.2 mL/min (A) (by C-G formula based on SCr of 0.35 mg/dL (  L)). Liver Function Tests: Recent Labs  Lab 04/27/19 1147  AST 37  ALT 109*  ALKPHOS 70  BILITOT 0.5  PROT 7.8  ALBUMIN 2.6*   No results for input(s): LIPASE, AMYLASE in the last 168 hours. No results for input(s): AMMONIA in the last 168 hours. Coagulation Profile: Recent Labs  Lab 04/27/19 1241  INR 1.2   Cardiac Enzymes: No results for input(s): CKTOTAL, CKMB, CKMBINDEX, TROPONINI in the last 168 hours. BNP (last 3 results) No results for input(s): PROBNP in the last 8760 hours. HbA1C: No results for input(s): HGBA1C in the last 72 hours. CBG: Recent Labs  Lab 05/02/19 0528 05/02/19 1135 05/02/19 1820 05/02/19 2306 05/03/19 0521  GLUCAP 190* 167* 181* 201* 162*   Lipid Profile: No results for input(s): CHOL, HDL, LDLCALC, TRIG, CHOLHDL, LDLDIRECT in the last 72 hours. Thyroid Function Tests: No results for input(s): TSH, T4TOTAL, FREET4, T3FREE,  THYROIDAB in the last 72 hours. Anemia Panel: No results for input(s): VITAMINB12, FOLATE, FERRITIN, TIBC, IRON, RETICCTPCT in the last 72 hours. Sepsis Labs: Recent Labs  Lab 04/27/19 1241 04/28/19 0521  LATICACIDVEN 1.5 1.1    Recent Results (from the past 240 hour(s))  Blood Culture (routine x 2)     Status: None   Collection Time: 04/27/19 12:43 PM   Specimen: BLOOD  Result Value Ref Range Status   Specimen Description BLOOD LEFT ANTECUBITAL  Final   Special Requests   Final    BOTTLES DRAWN AEROBIC AND ANAEROBIC Blood Culture results may not be optimal due to an inadequate volume of blood received in culture bottles   Culture   Final    NO GROWTH 5 DAYS Performed at [facility], [address] [place], [place] [address]    Report Status 05/02/2019 FINAL  Final  Blood Culture (routine x 2)     Status: Abnormal   Collection Time: 04/27/19 12:43 PM   Specimen: BLOOD  Result Value Ref Range Status   Specimen Description   Final    BLOOD BLOOD RIGHT FOREARM Performed at [facility], [address] [place], [place] [address]    Special Requests   Final    BOTTLES DRAWN AEROBIC AND ANAEROBIC Blood Culture adequate volume Performed at [facility], [address] [place], [place] [address]    Culture  Setup Time   Final    AEROBIC BOTTLE ONLY GRAM POSITIVE RODS CRITICAL RESULT CALLED TO, READ BACK BY AND VERIFIED WITH: SCOTT HALL ON 04/28/19 AT [address] [facility] Performed at [facility], [address], [place], [place] [address]    Culture (A)  Final    DIPHTHEROIDS(CORYNEBACTERIUM SPECIES) Standardized susceptibility testing for this organism is not available. Performed at [facility], [address] [place], [place] [address]    Report Status 05/01/2019 FINAL  Final  Urine culture     Status: Abnormal   Collection Time: 04/27/19 12:43 PM   Specimen: In/Out Cath Urine  Result Value Ref Range Status   Specimen Description    Final    IN/OUT CATH URINE Performed at [facility], [address], [place], [place] [address]    Special Requests   Final    NONE Performed at [facility], [address] [place], [place] [address]    Culture MULTIPLE SPECIES PRESENT, SUGGEST RECOLLECTION (A)  Final   Report Status 04/29/2019 FINAL  Final  Respiratory Panel by RT PCR (Flu A&B, Covid) -     Status:  None   Collection Time: 04/27/19 12:43 PM  Result Value Ref Range Status   SARS Coronavirus 2 by RT PCR NEGATIVE NEGATIVE Final    Comment: (NOTE) SARS-CoV-2 target nucleic acids are NOT DETECTED. The SARS-CoV-2 RNA is generally detectable in upper respiratoy specimens during the acute phase of infection. The lowest concentration of SARS-CoV-2 viral copies this assay can detect is 131 copies/mL. A negative result does not preclude SARS-Cov-2 infection and should not be used as the sole basis for treatment or other patient management decisions. A negative result may occur with  improper specimen collection/handling, submission of specimen other than nasopharyngeal swab, presence of viral mutation(s) within the areas targeted by this assay, and inadequate number of viral copies (<131 copies/mL). A negative result must be combined with clinical observations, patient history, and epidemiological information. The expected result is Negative. Fact Sheet for Patients:  https://www.moore.com/ Fact Sheet for Healthcare Providers:  https://www.young.biz/ This test is not yet ap proved or cleared by the [place] FDA and  has been authorized for detection and/or diagnosis of SARS-CoV-2 by FDA under an Emergency Use Authorization (EUA). This EUA will remain  in effect (meaning this test can be used) for the duration of the COVID-19 declaration under Section 564(b)(1) of the Act, 21 U.S.C. section 360bbb-3(b)(1), unless the authorization is terminated or revoked  sooner.    Influenza A by PCR NEGATIVE NEGATIVE Final   Influenza B by PCR NEGATIVE NEGATIVE Final    Comment: (NOTE) The Xpert Xpress SARS-CoV-2/FLU/RSV assay is intended as an aid in  the diagnosis of influenza from Nasopharyngeal swab specimens and  should not be used as a sole basis for treatment. Nasal washings and  aspirates are unacceptable for Xpert Xpress SARS-CoV-2/FLU/RSV  testing. Fact Sheet for Patients: https://www.moore.com/ Fact Sheet for Healthcare Providers: https://www.young.biz/ This test is not yet approved or cleared by the [place] FDA and  has been authorized for detection and/or diagnosis of SARS-CoV-2 by  FDA under an Emergency Use Authorization (EUA). This EUA will remain  in effect (meaning this test can be used) for the duration of the  Covid-19 declaration under Section 564(b)(1) of the Act, 21  U.S.C. section 360bbb-3(b)(1), unless the authorization is  terminated or revoked. Performed at [facility], [address] [place], [place] [address]   C difficile quick scan w PCR reflex     Status: None   Collection Time: 04/27/19  1:10 PM   Specimen: Stool  Result Value Ref Range Status   C Diff antigen NEGATIVE NEGATIVE Final   C Diff toxin NEGATIVE NEGATIVE Final   C Diff interpretation No C. difficile detected.  Final    Comment: Performed at [facility], [address] [place], [place] [address]  MRSA PCR Screening     Status: None   Collection Time: 04/29/19  3:47 PM   Specimen: Nasopharyngeal  Result Value Ref Range Status   MRSA by PCR NEGATIVE NEGATIVE Final    Comment:        The GeneXpert MRSA Assay (FDA approved for NASAL specimens only), is one component of a comprehensive MRSA colonization surveillance program. It is not intended to diagnose MRSA infection nor to guide or monitor treatment for MRSA infections. Performed at [facility], [address] [place], [place] [address]          Radiology Studies: No results found.      Scheduled Meds: . aspirin EC  81 mg Oral Daily  .  Chlorhexidine Gluconate Cloth  6 each Topical Daily  . enoxaparin (LOVENOX) injection  40 mg Subcutaneous Q12H  . famotidine  20 mg Per Tube BID  . free water  100 mL Per Tube Q4H  . hydrogen peroxide   Topical BID  . insulin aspart  0-20 Units Subcutaneous Q6H  . levETIRAcetam  1,500 mg Per Tube BID  . magnesium oxide  800 mg Per Tube Daily  . mouth rinse  15 mL Mouth Rinse q12n4p  . pneumococcal 23 valent vaccine  0.5 mL Intramuscular Tomorrow-1000   Continuous Infusions: . sodium chloride Stopped (05/01/19 0705)  . ceFEPime (MAXIPIME) IV 2 g (05/03/19 0641)  . feeding supplement (GLUCERNA 1.5 CAL) 80 mL/hr at 05/03/19 0800  . vancomycin Stopped (05/03/19 0037)     LOS: 6 days    Time spent: 30-35 minutes    Pennie BanterKelly A Ronnell Clinger, DO [facility]   If 7PM-7AM, please contact night-coverage [email or web address] 05/03/2019, 8:49 AM

## 2019-05-03 NOTE — Progress Notes (Signed)
Physical Therapy Treatment Patient Details Name: Paul Hall MRN: [id number] DOB: 05/31/1995 Today's Date: 05/03/2019    History of Present Illness Paul Hall is a 42yoM who comes to [facility] on 1/23 from home at recommendation of PCP for closer assessment of scaral wound. respiratory failure, failure to wean from ventilator requiring tracheostomy, insulin-dependent diabetes mellitus, shock requiring vasopressors, hypertension, Serratia urinary tract infection, stroke leading to quadriplegia, seizures, subdural hematoma, bilateral lower extremity DVT status post IVC filter placement 02/16/2019, dysphagia status post PEG tube placement, urinary retention with chronic indwelling Foley catheter, ASNKN-[id number] infection complicated by ARDS requiring ECMO. [facility] in [place] on 01/20/2019 and discharged to acute rehab on 03/15/2019. Pt noted to be in sepsis d/t sacral wound infection. Spouse states patient utilizes passey-muir at home without noted respiratory distress, also reports PMH OSA. Surgical consult 1/24 recommending conservative management of woudns at this time. ENT consult 'Patient is not a candidate for decannulation at this time.' Pt has been home for about 2 weeks PTA, extensive DME in home, but no therapy services due to insurance barriers as pt still trying to apply for medicaid.    PT Comments    Pt in bed upon entry, appears asleep throughout majority of session with a few spontaneous moments of eyes open. Wife at bedside. Chief Strategy Officer commenced education of performing P/ROM for BUE for shoulder and elbow level, education on positioning. Pt given handouts, verbal explanation, and opportunity to perform tasks with live-time in-person feedback on technique. Plans to continue with same for BLE next session. HR remains 110-120s throughout, SpO2: 100%.         Follow Up Recommendations  Home health PT;Supervision/Assistance - 24 hour     Equipment Recommendations  None  recommended by PT    Recommendations for Other Services       Precautions / Restrictions Precautions Precaution Comments: multiple wounds, tube feeder, tracheostomy collar on room air Restrictions Weight Bearing Restrictions: No    Mobility  Bed Mobility Overal bed mobility: (deferred;)                Transfers                    Ambulation/Gait                 Stairs             Wheelchair Mobility    Modified Rankin (Stroke Patients Only)       Balance                                            Cognition Arousal/Alertness: Lethargic   Overall Cognitive Status: History of cognitive impairments - at baseline Area of Impairment: Rancho level               Rancho Levels of Cognitive Functioning Rancho Los Amigos Scales of Cognitive Functioning: Generalized response               General Comments: No clear indication that patient is able to effectively communicate in a meaningful way, family has been reliant on grimacing and other body language, as well as grunting for communication needs. Heavy cognitive impairment since CVA in Nov 2020.      Exercises General Exercises - Upper Extremity Shoulder Flexion: PROM;Both;20 reps;Supine;Limitations Shoulder Flexion Limitations: handout provided, and wife educated on performance, wife performing 25%  of reps. Shoulder ABduction: PROM;Both;20 reps;Supine;Limitations Shoulder Abduction Limitations: handout provided, and wife educated on performance, wife performing 25% of reps. Elbow Flexion: PROM;Supine;20 reps;Limitations Elbow Flexion Limitations: handout provided, and wife educated on performance, wife performing 25% of reps. Elbow Extension: PROM;Supine;20 reps;Limitations Elbow Extension Limitations: handout provided, and wife educated on performance, wife performing 25% of reps. Other Exercises Other Exercises: Shoulder external rotation P/ROM: handout  provided, and wife educated on performance, wife performing 25% of reps.    General Comments        Pertinent Vitals/Pain Pain Assessment: Faces Faces Pain Scale: No hurt Pain Intervention(s): Limited activity within patient's tolerance;Monitored during session;Repositioned    Home Living                      Prior Function            PT Goals (current goals can now be found in the care plan section) Acute Rehab PT Goals Patient Stated Goal: improve ability to care for patient at home PT Goal Formulation: With family Time For Goal Achievement: 05/16/19 Potential to Achieve Goals: Fair Progress towards PT goals: Progressing toward goals    Frequency    Min 2X/week      PT Plan Current plan remains appropriate    Co-evaluation              AM-PAC PT "6 Clicks" Mobility   Outcome Measure  Help needed turning from your back to your side while in a flat bed without using bedrails?: Total Help needed moving from lying on your back to sitting on the side of a flat bed without using bedrails?: Total Help needed moving to and from a bed to a chair (including a wheelchair)?: Total Help needed standing up from a chair using your arms (e.g., wheelchair or bedside chair)?: Total Help needed to walk in hospital room?: Total Help needed climbing 3-5 steps with a railing? : Total 6 Click Score: 6    End of Session Equipment Utilized During Treatment: Oxygen(for humidification per RT) Activity Tolerance: Patient tolerated treatment well;Patient limited by lethargy Patient left: in bed;with family/visitor present;with call bell/phone within reach Nurse Communication: Mobility status PT Visit Diagnosis: Muscle weakness (generalized) (M62.81);Other symptoms and signs involving the nervous system (R29.898);Apraxia (R48.2);Hemiplegia and hemiparesis Hemiplegia - Right/Left: (bilat) Hemiplegia - dominant/non-dominant: (bilat) Hemiplegia - caused by: Other Nontraumatic  intracranial hemorrhage;Cerebral infarction     Time: 7001-7494 PT Time Calculation (min) (ACUTE ONLY): 28 min  Charges:  $Self Care/Home Management: 23-37                     4:03 PM, 05/03/19 Rosamaria Lints, PT, DPT Physical Therapist - [facility]  [phone] (ASCOM)     [name] Robello C 05/03/2019, 3:57 PM

## 2019-05-04 LAB — CBC WITH DIFFERENTIAL/PLATELET
Abs Immature Granulocytes: 0.03 10*3/uL (ref 0.00–0.07)
Basophils Absolute: 0 10*3/uL (ref 0.0–0.1)
Basophils Relative: 0 %
Eosinophils Absolute: 0.2 10*3/uL (ref 0.0–0.5)
Eosinophils Relative: 2 %
HCT: 32.2 % — ABNORMAL LOW (ref 39.0–52.0)
Hemoglobin: 9.9 g/dL — ABNORMAL LOW (ref 13.0–17.0)
Immature Granulocytes: 0 %
Lymphocytes Relative: 34 %
Lymphs Abs: 3.2 10*3/uL (ref 0.7–4.0)
MCH: 26.2 pg (ref 26.0–34.0)
MCHC: 30.7 g/dL (ref 30.0–36.0)
MCV: 85.2 fL (ref 80.0–100.0)
Monocytes Absolute: 0.7 10*3/uL (ref 0.1–1.0)
Monocytes Relative: 8 %
Neutro Abs: 5.2 10*3/uL (ref 1.7–7.7)
Neutrophils Relative %: 56 %
Platelets: 394 10*3/uL (ref 150–400)
RBC: 3.78 MIL/uL — ABNORMAL LOW (ref 4.22–5.81)
RDW: 13.7 % (ref 11.5–15.5)
WBC: 9.4 10*3/uL (ref 4.0–10.5)
nRBC: 0 % (ref 0.0–0.2)

## 2019-05-04 LAB — MAGNESIUM: Magnesium: 1.8 mg/dL (ref 1.7–2.4)

## 2019-05-04 LAB — HEPATIC FUNCTION PANEL
ALT: 100 U/L — ABNORMAL HIGH (ref 0–44)
AST: 43 U/L — ABNORMAL HIGH (ref 15–41)
Albumin: 2.4 g/dL — ABNORMAL LOW (ref 3.5–5.0)
Alkaline Phosphatase: 57 U/L (ref 38–126)
Bilirubin, Direct: <0.1 mg/dL (ref 0.0–0.2)
Total Bilirubin: 0.5 mg/dL (ref 0.3–1.2)
Total Protein: 7.7 g/dL (ref 6.5–8.1)

## 2019-05-04 LAB — BASIC METABOLIC PANEL
Anion gap: 12 (ref 5–15)
BUN: 8 mg/dL (ref 6–20)
CO2: 28 mmol/L (ref 22–32)
Calcium: 9.6 mg/dL (ref 8.9–10.3)
Chloride: 99 mmol/L (ref 98–111)
Creatinine, Ser: <0.3 mg/dL — ABNORMAL LOW (ref 0.61–1.24)
Glucose, Bld: 195 mg/dL — ABNORMAL HIGH (ref 70–99)
Potassium: 4.2 mmol/L (ref 3.5–5.1)
Sodium: 139 mmol/L (ref 135–145)

## 2019-05-04 LAB — GLUCOSE, CAPILLARY
Glucose-Capillary: 184 mg/dL — ABNORMAL HIGH (ref 70–99)
Glucose-Capillary: 186 mg/dL — ABNORMAL HIGH (ref 70–99)
Glucose-Capillary: 198 mg/dL — ABNORMAL HIGH (ref 70–99)
Glucose-Capillary: 220 mg/dL — ABNORMAL HIGH (ref 70–99)
Glucose-Capillary: 223 mg/dL — ABNORMAL HIGH (ref 70–99)
Glucose-Capillary: 235 mg/dL — ABNORMAL HIGH (ref 70–99)

## 2019-05-04 LAB — PHOSPHORUS: Phosphorus: 3.7 mg/dL (ref 2.5–4.6)

## 2019-05-04 MED ORDER — FUROSEMIDE 10 MG/ML IJ SOLN
40.0000 mg | Freq: Once | INTRAMUSCULAR | Status: AC
  Administered 2019-05-04: 40 mg via INTRAVENOUS
  Filled 2019-05-04: qty 4

## 2019-05-04 MED ORDER — INSULIN ASPART 100 UNIT/ML ~~LOC~~ SOLN
0.0000 [IU] | SUBCUTANEOUS | Status: DC
  Administered 2019-05-04 – 2019-05-05 (×2): 7 [IU] via SUBCUTANEOUS
  Administered 2019-05-05: 4 [IU] via SUBCUTANEOUS
  Administered 2019-05-05: 7 [IU] via SUBCUTANEOUS
  Administered 2019-05-05 (×2): 4 [IU] via SUBCUTANEOUS
  Administered 2019-05-06 (×3): 7 [IU] via SUBCUTANEOUS
  Administered 2019-05-06: 11 [IU] via SUBCUTANEOUS
  Administered 2019-05-06: 7 [IU] via SUBCUTANEOUS
  Administered 2019-05-06: 11 [IU] via SUBCUTANEOUS
  Administered 2019-05-07: 4 [IU] via SUBCUTANEOUS
  Administered 2019-05-07: 7 [IU] via SUBCUTANEOUS
  Administered 2019-05-07 (×2): 11 [IU] via SUBCUTANEOUS
  Administered 2019-05-07 (×2): 7 [IU] via SUBCUTANEOUS
  Administered 2019-05-07: 11 [IU] via SUBCUTANEOUS
  Administered 2019-05-08: 7 [IU] via SUBCUTANEOUS
  Administered 2019-05-08: 4 [IU] via SUBCUTANEOUS
  Administered 2019-05-08: 7 [IU] via SUBCUTANEOUS
  Administered 2019-05-08 – 2019-05-09 (×4): 4 [IU] via SUBCUTANEOUS
  Administered 2019-05-09 (×2): 7 [IU] via SUBCUTANEOUS
  Administered 2019-05-10: 4 [IU] via SUBCUTANEOUS
  Administered 2019-05-10: 7 [IU] via SUBCUTANEOUS
  Administered 2019-05-10: 4 [IU] via SUBCUTANEOUS
  Administered 2019-05-10 (×2): 7 [IU] via SUBCUTANEOUS
  Administered 2019-05-10 – 2019-05-11 (×2): 4 [IU] via SUBCUTANEOUS
  Administered 2019-05-11 (×3): 7 [IU] via SUBCUTANEOUS
  Filled 2019-05-04 (×37): qty 1

## 2019-05-04 NOTE — Progress Notes (Signed)
PROGRESS NOTE    Kentravious Lipford  CVE:[id number] DOB: [birth year] DOA: 04/27/2019  PCP: Cory Munch, PA-C    LOS - 7   Brief Narrative:  Medical records reviewed and are as summarized below: Ross Ludwig Withersis an 24 y.o.malewith medical history significant for respiratory failure, failure to wean from ventilator requiring tracheostomy, insulin-dependent diabetes mellitus, shock requiring vasopressors, hypertension, Serratia urinary tract infection, stroke leading to quadriplegia, seizures, subdural hematoma, bilateral lower extremity DVT status post IVC filter placement 02/16/2019, dysphagia status post PEG tube placement, urinary retention with chronic indwelling Foley catheter, WCHEN-27 infection complicated by ARDS requiring ECMOand stroke. He was admitted to [facility] in [facility] 10/18/2020and discharged to acute rehab on 03/15/2019. According to his wife at the bedside, patient was discharged from the rehab facility a few weeks ago.  Patient is nonverbal and unable to provide any history so history was obtained from chart review andhis wife at the bedside. His wife said patient was brought to the emergency room at the recommendation of his PCP for evaluation of sacral decubitus ulcer. She saidhe developed a sacral ulcer from his hospitalization at [facility]. She said the patient could not be turned while he was on ECMO and that led to the development of the ulcer. She also said that there has been intermittent drainage from the wound. She also said PCP was concerned about the tracheostomy and said that patient no longer required a tracheostomy wanted it to be removed. She does not report any fever or vomiting. His wife said that patient is nonverbal but sometimes he can move his extremities from side to side.  Admitted with sepsis secondary to sacral wound infection.  Subjective 1/30: Patient seen this AM.  No family at bedside.  He opened his eyes on command  for me today.  Noticed he was furrowing his brow or wincing a little, asked him to blink twice if in pain but he closed his eyes and did not respond further.  No acute events reported.   Assessment & Plan:   Principal Problem:   Sepsis ([place]) Active Problems:   H/O insulin dependent diabetes mellitus   History of stroke   Seizure disorder ([place])   Decubitus ulcer of sacral region, unstageable ([place])   Goals of care, counseling/discussion   Palliative care encounter   Sepsis due to unstageable sacral woundinfection/leukocytosis: Leukocytosis has resolved.  General surgery consulted, no surgery recommended, wound vac placed. Surgery signed off. No evidence of osteomyelitis on CT pelvisand blood cultures NGTD.Patient remains tachycardic but this is baseline per his wife (usually HR 110's to 120), and tachypneic at times. --stop empiric Vanc/Zosyn - completed 7 day course --monitor clinically for s/sx's of infection --maintain wound vac --family needs training on sacral wound care at home, may not find a [facility] agency covered by patient's insurance and trach patient.  TOC working on it.  Positive Fluid Balance - net positive almost 4 L to date.  Will give 40 IV lasix once today.   No worsening hypoxia to suggest pulmonary edema.    Hypotension:Resolved.  Normotensive without medications. --Hold amlodipine, chlorthalidone and Coreg  Hypokalemia:replaced --monitor BMP daily and replace as needed  Insulin-dependent diabetes mellitus: Continue insulin and monitor glucose levels  History of stroke with quadriplegia: Continue aspirin --continue Keppra for seizure prophylaxis --consult neurology regarding whether Keppra still indicated or dose can be reduced (per wife request)  No history ofSeizure disorder:Keppra for seizure prevention in setting of stroke(s), per wife.  Dysphagia: Continue enteral nutrition.  Urinary retentionwith abnormal urinalysis: Chronic  indwelling Foley catheter in place.  --Abnormal urinalysis is probably due to indwelling Foley catheter.  --No leukocytosis or signs of developing infection now off antibiotics --monitor clinically for s/sx's of infection  Chronic hypoxemic respiratory failure s/p tracheostomy: He is onFiO2 28% and5 L/min oxygen via trach collar. Minimal of 28% FiO2 is required for humidity through trach collarper RT. His wife said he has intermittent oxygen desaturation especially when he is sleeping and they were told in the past that this was from obstructive sleep apnea.   --recommend sleep study in the future and treating OSA  History of bilateral lower extremity DVT status post IVC filter placement:  Continue Lovenox for DVT prophylaxis  Hyponatremia: Improved.  Morbid Obesity - BMI 65.7   DVT prophylaxis:Lovenox Code Status: Full Code Family Communication:none at bedside Disposition Plan:Pending home health set up for wound vac.   Consultants:  General surgery - signed off   Antimicrobials:  Cefepime 1/23 >>1/29  Vancomycin 1/23 >>1/29  Flagyl once 1/23   Objective: Vitals:   05/04/19 1300 05/04/19 1400 05/04/19 1500 05/04/19 1618  BP: 138/81 138/83 134/83   Pulse: (!) 116 (!) 112 (!) 111   Resp: 19 20 20    Temp:  98.7 F (37.1 C)    TempSrc:  Axillary    SpO2: 100% 100% 100% 99%  Weight:      Height:        Intake/Output Summary (Last 24 hours) at 05/04/2019 1656 Last data filed at 05/04/2019 1400 Gross per 24 hour  Intake 1180 ml  Output 1310 ml  Net -130 ml   Filed Weights   05/02/19 0329 05/03/19 0419 05/04/19 0500  Weight: (!) 205 kg (!) 209.6 kg (!) 209.1 kg    Examination:  General exam: no acute distress, morbidly obese, appears to be sleeping HEENT: trach present with trach collar oxygen Respiratory system: clear to auscultation anteriorly and lateral bases on exam limited by body habitus, no wheezes, rales or rhonchi,  normal respiratory effort. Cardiovascular system: normal S1/S2, RRR, L>R lower extremity edema.   Gastrointestinal system: soft, non-tender, non-distended abdomen, normal bowel sounds. Central nervous system: follow command of opening his eyes today for a brief moment, otherwise unresponsive, does not move limbs Extremities: bilateral off-loading boots in place, no movement of extremities Psychiatry: normal mood, congruent affect, judgement and insight appear normal    Data Reviewed: I have personally reviewed following labs and imaging studies  CBC: Recent Labs  Lab 04/28/19 0521 04/29/19 0601 04/30/19 0531 05/01/19 0624 05/04/19 0628  WBC 11.7* 10.3 10.6* 9.3 9.4  NEUTROABS  --  6.7 7.1 5.8 5.2  HGB 10.2* 10.4* 9.7* 10.3* 9.9*  HCT 33.0* 33.6* 32.0* 34.6* 32.2*  MCV 85.3 85.5 85.6 86.7 85.2  PLT 375 397 404* 369 394   Basic Metabolic Panel: Recent Labs  Lab 04/28/19 0521 04/28/19 0521 04/29/19 0601 04/29/19 0620 04/30/19 0531 05/01/19 0528 05/02/19 0419 05/04/19 0628  NA 137  --  137  --  136 138  --  139  K 3.8  --  3.1*  --  3.6 3.9  --  4.2  CL 95*  --  97*  --  99 101  --  99  CO2 30  --  29  --  29 27  --  28  GLUCOSE 181*  --  144*  --  190* 172*  --  195*  BUN 9  --  6  --  10 9  --  8  CREATININE 0.41*   < > 0.36*  --  <0.30* 0.32* 0.35* <0.30*  CALCIUM 9.2  --  9.4  --  9.2 9.2  --  9.6  MG  --   --   --  1.9 1.7  --   --  1.8  PHOS  --   --   --   --   --   --   --  3.7   < > = values in this interval not displayed.   GFR: CrCl cannot be calculated (This lab value cannot be used to calculate CrCl because it is not a number: <0.30). Liver Function Tests: Recent Labs  Lab 05/04/19 0628  AST 43*  ALT 100*  ALKPHOS 57  BILITOT 0.5  PROT 7.7  ALBUMIN 2.4*   No results for input(s): LIPASE, AMYLASE in the last 168 hours. No results for input(s): AMMONIA in the last 168 hours. Coagulation Profile: No results for input(s): INR, PROTIME in the last  168 hours. Cardiac Enzymes: No results for input(s): CKTOTAL, CKMB, CKMBINDEX, TROPONINI in the last 168 hours. BNP (last 3 results) No results for input(s): PROBNP in the last 8760 hours. HbA1C: No results for input(s): HGBA1C in the last 72 hours. CBG: Recent Labs  Lab 05/03/19 1704 05/03/19 2337 05/04/19 0619 05/04/19 0726 05/04/19 1224  GLUCAP 206* 247* 184* 186* 198*   Lipid Profile: No results for input(s): CHOL, HDL, LDLCALC, TRIG, CHOLHDL, LDLDIRECT in the last 72 hours. Thyroid Function Tests: No results for input(s): TSH, T4TOTAL, FREET4, T3FREE, THYROIDAB in the last 72 hours. Anemia Panel: No results for input(s): VITAMINB12, FOLATE, FERRITIN, TIBC, IRON, RETICCTPCT in the last 72 hours. Sepsis Labs: Recent Labs  Lab 04/28/19 0521  LATICACIDVEN 1.1    Recent Results (from the past 240 hour(s))  Blood Culture (routine x 2)     Status: None   Collection Time: 04/27/19 12:43 PM   Specimen: BLOOD  Result Value Ref Range Status   Specimen Description BLOOD LEFT ANTECUBITAL  Final   Special Requests   Final    BOTTLES DRAWN AEROBIC AND ANAEROBIC Blood Culture results may not be optimal due to an inadequate volume of blood received in culture bottles   Culture   Final    NO GROWTH 5 DAYS Performed at [facility], [address], [place], [place] [address]    Report Status 05/02/2019 FINAL  Final  Blood Culture (routine x 2)     Status: Abnormal   Collection Time: 04/27/19 12:43 PM   Specimen: BLOOD  Result Value Ref Range Status   Specimen Description   Final    BLOOD BLOOD RIGHT FOREARM Performed at [facility], [address], [place], [place] [address]    Special Requests   Final    BOTTLES DRAWN AEROBIC AND ANAEROBIC Blood Culture adequate volume Performed at [facility], [address] [place], [place] [address]    Culture  Setup Time   Final    AEROBIC BOTTLE ONLY GRAM POSITIVE RODS CRITICAL RESULT CALLED TO, READ  BACK BY AND VERIFIED WITH: SCOTT HALL ON 04/28/19 AT [address] [facility] Performed at [facility], [address], [place], [place] [address]    Culture (A)  Final    DIPHTHEROIDS(CORYNEBACTERIUM SPECIES) Standardized susceptibility testing for this organism is not available. Performed at [facility], [address] [place], [place] [address]    Report Status 05/01/2019 FINAL  Final  Urine culture  Status: Abnormal   Collection Time: 04/27/19 12:43 PM   Specimen: In/Out Cath Urine  Result Value Ref Range Status   Specimen Description   Final    IN/OUT CATH URINE Performed at [facility], [address] [place], [place] [address]    Special Requests   Final    NONE Performed at [facility], [address] [place], [place] [address]    Culture MULTIPLE SPECIES PRESENT, SUGGEST RECOLLECTION (A)  Final   Report Status 04/29/2019 FINAL  Final  Respiratory Panel by RT PCR (Flu A&B, Covid) -     Status: None   Collection Time: 04/27/19 12:43 PM  Result Value Ref Range Status   SARS Coronavirus 2 by RT PCR NEGATIVE NEGATIVE Final    Comment: (NOTE) SARS-CoV-2 target nucleic acids are NOT DETECTED. The SARS-CoV-2 RNA is generally detectable in upper respiratoy specimens during the acute phase of infection. The lowest concentration of SARS-CoV-2 viral copies this assay can detect is 131 copies/mL. A negative result does not preclude SARS-Cov-2 infection and should not be used as the sole basis for treatment or other patient management decisions. A negative result may occur with  improper specimen collection/handling, submission of specimen other than nasopharyngeal swab, presence of viral mutation(s) within the areas targeted by this assay, and inadequate number of viral copies (<131 copies/mL). A negative result must be combined with clinical observations, patient history, and epidemiological information. The expected result is Negative. Fact Sheet  for Patients:  https://www.moore.com/ Fact Sheet for Healthcare Providers:  https://www.young.biz/ This test is not yet ap proved or cleared by the [place] FDA and  has been authorized for detection and/or diagnosis of SARS-CoV-2 by FDA under an Emergency Use Authorization (EUA). This EUA will remain  in effect (meaning this test can be used) for the duration of the COVID-19 declaration under Section 564(b)(1) of the Act, 21 U.S.C. section 360bbb-3(b)(1), unless the authorization is terminated or revoked sooner.    Influenza A by PCR NEGATIVE NEGATIVE Final   Influenza B by PCR NEGATIVE NEGATIVE Final    Comment: (NOTE) The Xpert Xpress SARS-CoV-2/FLU/RSV assay is intended as an aid in  the diagnosis of influenza from Nasopharyngeal swab specimens and  should not be used as a sole basis for treatment. Nasal washings and  aspirates are unacceptable for Xpert Xpress SARS-CoV-2/FLU/RSV  testing. Fact Sheet for Patients: https://www.moore.com/ Fact Sheet for Healthcare Providers: https://www.young.biz/ This test is not yet approved or cleared by the [place] FDA and  has been authorized for detection and/or diagnosis of SARS-CoV-2 by  FDA under an Emergency Use Authorization (EUA). This EUA will remain  in effect (meaning this test can be used) for the duration of the  Covid-19 declaration under Section 564(b)(1) of the Act, 21  U.S.C. section 360bbb-3(b)(1), unless the authorization is  terminated or revoked. Performed at [facility], [address] [place], [place] [address]   C difficile quick scan w PCR reflex     Status: None   Collection Time: 04/27/19  1:10 PM   Specimen: Stool  Result Value Ref Range Status   C Diff antigen NEGATIVE NEGATIVE Final   C Diff toxin NEGATIVE NEGATIVE Final   C Diff interpretation No C. difficile detected.  Final    Comment: Performed at [facility], [address] [place] [place], [place] [address]  MRSA PCR Screening     Status: None   Collection Time: 04/29/19  3:47 PM   Specimen: Nasopharyngeal  Result Value Ref Range Status   MRSA by PCR NEGATIVE NEGATIVE Final    Comment:        The GeneXpert MRSA Assay (FDA approved for NASAL specimens only), is one component of a comprehensive MRSA colonization surveillance program. It is not intended to diagnose MRSA infection nor to guide or monitor treatment for MRSA infections. Performed at [facility], [address] [place], [place] [id number]          Radiology Studies: No results found.      Scheduled Meds: . aspirin EC  81 mg Oral Daily  . Chlorhexidine Gluconate Cloth  6 each Topical Daily  . enoxaparin (LOVENOX) injection  40 mg Subcutaneous Q12H  . famotidine  20 mg Per Tube BID  . free water  100 mL Per Tube Q4H  . insulin aspart  0-20 Units Subcutaneous Q6H  . levETIRAcetam  1,500 mg Per Tube BID  . magnesium oxide  800 mg Per Tube Daily  . mouth rinse  15 mL Mouth Rinse q12n4p  . pneumococcal 23 valent vaccine  0.5 mL Intramuscular Tomorrow-1000   Continuous Infusions: . sodium chloride Stopped (05/01/19 0705)  . feeding supplement (GLUCERNA 1.5 CAL) 80 mL/hr at 05/03/19 1200     LOS: 7 days    Time spent: 25-30 minutes    Pennie BanterKelly A Savahna Casados, DO [facility]   If 7PM-7AM, please contact night-coverage [email or web address] 05/04/2019, 4:56 PM

## 2019-05-04 NOTE — Progress Notes (Signed)
This was my first encounter with this patient, 1/26. He was asleep or sedated (unknown). His wife was present. I had the opportunity to hear about her family, ( her father died maybe two weeks ago). He and her husband were hospitalized rooms away from one another. This current hospitalization and prognosis is very challenging she stated, but she is confident things will get better. There is an after care plan being set up for home care. I prayed with wife and assure her we are available for their support.

## 2019-05-05 LAB — BASIC METABOLIC PANEL
Anion gap: 12 (ref 5–15)
BUN: 10 mg/dL (ref 6–20)
CO2: 28 mmol/L (ref 22–32)
Calcium: 9.7 mg/dL (ref 8.9–10.3)
Chloride: 95 mmol/L — ABNORMAL LOW (ref 98–111)
Creatinine, Ser: 0.32 mg/dL — ABNORMAL LOW (ref 0.61–1.24)
GFR calc Af Amer: >60 mL/min (ref >60)
GFR calc non Af Amer: >60 mL/min (ref >60)
Glucose, Bld: 217 mg/dL — ABNORMAL HIGH (ref 70–99)
Potassium: 4.4 mmol/L (ref 3.5–5.1)
Sodium: 135 mmol/L (ref 135–145)

## 2019-05-05 LAB — GLUCOSE, CAPILLARY
Glucose-Capillary: 198 mg/dL — ABNORMAL HIGH (ref 70–99)
Glucose-Capillary: 200 mg/dL — ABNORMAL HIGH (ref 70–99)
Glucose-Capillary: 226 mg/dL — ABNORMAL HIGH (ref 70–99)
Glucose-Capillary: 241 mg/dL — ABNORMAL HIGH (ref 70–99)
Glucose-Capillary: 265 mg/dL — ABNORMAL HIGH (ref 70–99)

## 2019-05-05 LAB — CBC WITH DIFFERENTIAL/PLATELET
Abs Immature Granulocytes: 0.03 10*3/uL (ref 0.00–0.07)
Basophils Absolute: 0 10*3/uL (ref 0.0–0.1)
Basophils Relative: 0 %
Eosinophils Absolute: 0.2 10*3/uL (ref 0.0–0.5)
Eosinophils Relative: 3 %
HCT: 38.3 % — ABNORMAL LOW (ref 39.0–52.0)
Hemoglobin: 11.7 g/dL — ABNORMAL LOW (ref 13.0–17.0)
Immature Granulocytes: 0 %
Lymphocytes Relative: 30 %
Lymphs Abs: 2.3 10*3/uL (ref 0.7–4.0)
MCH: 26 pg (ref 26.0–34.0)
MCHC: 30.5 g/dL (ref 30.0–36.0)
MCV: 85.1 fL (ref 80.0–100.0)
Monocytes Absolute: 0.5 10*3/uL (ref 0.1–1.0)
Monocytes Relative: 7 %
Neutro Abs: 4.8 10*3/uL (ref 1.7–7.7)
Neutrophils Relative %: 60 %
Platelets: 401 10*3/uL — ABNORMAL HIGH (ref 150–400)
RBC: 4.5 MIL/uL (ref 4.22–5.81)
RDW: 13.8 % (ref 11.5–15.5)
WBC: 7.9 10*3/uL (ref 4.0–10.5)
nRBC: 0 % (ref 0.0–0.2)

## 2019-05-05 LAB — MAGNESIUM: Magnesium: 1.8 mg/dL (ref 1.7–2.4)

## 2019-05-05 MED ORDER — FUROSEMIDE 10 MG/ML IJ SOLN
40.0000 mg | Freq: Once | INTRAMUSCULAR | Status: AC
  Administered 2019-05-05: 40 mg via INTRAVENOUS
  Filled 2019-05-05: qty 4

## 2019-05-05 MED ORDER — INSULIN GLARGINE 100 UNIT/ML ~~LOC~~ SOLN
15.0000 [IU] | Freq: Two times a day (BID) | SUBCUTANEOUS | Status: DC
  Administered 2019-05-05 – 2019-05-06 (×2): 15 [IU] via SUBCUTANEOUS
  Filled 2019-05-05 (×4): qty 0.15

## 2019-05-05 MED ORDER — LEVETIRACETAM 100 MG/ML PO SOLN
1000.0000 mg | Freq: Two times a day (BID) | ORAL | Status: DC
  Administered 2019-05-05 – 2019-05-11 (×13): 1000 mg
  Filled 2019-05-05 (×16): qty 10

## 2019-05-05 NOTE — Consult Note (Signed)
Reason for Consult: AMS Requesting Physician: Dr. Arbutus Ped   CC: AMS  HPI: Paul Hall is an 24 y.o. male with medical history significant for respiratory failure, failure to wean from ventilator requiring tracheostomy, insulin-dependent diabetes mellitus, shock requiring vasopressors, hypertension, Serratia urinary tract infection, stroke leading to quadriplegia, seizures, subdural hematoma, bilateral lower extremity DVT status post IVC filter placement 02/16/2019, dysphagia status post PEG tube placement, urinary retention with chronic indwelling Foley catheter, [id number] infection complicated by ARDS requiring ECMOand stroke. Patient has been in the hospital for 7 days with AMS likely in setting of above co morbidities.  Patient is on Keppra 1500 BID. Unclear when last seizure occurred.  Neurology consulted for Keppra dosing adjustment.    Past Medical History:  Diagnosis Date  . Diabetes mellitus ([place])   . Hypertension   . Stroke [facility])     Past Surgical History:  Procedure Laterality Date  . IVC FILTER PLACEMENT ([facility] HX)    . LEG SURGERY    . PEG TUBE PLACEMENT    . TRACHEOSTOMY      History reviewed. No pertinent family history.  Social History:  reports that he has never smoked. He has never used smokeless tobacco. He reports that he does not drink alcohol or use drugs.  Allergies  Allergen Reactions  . Morphine Hives    Patient has tolerated oxycodone at high amounts during Nov 2020 hospitalization  . Benzonatate Other (See Comments)    Medications: I have reviewed the patient's current medications.  ROS: Unable to obtain as not following commands.   Physical Examination: Blood pressure 122/75, pulse (!) 114, temperature 98.5 F (36.9 C), temperature source Oral, resp. rate (!) 36, height 5\' 10"  (1.778 m), weight (!) 209.1 kg, SpO2 100 %.  Patient opens eyes to pain occasionally Minimal withdrawal in his upper extremities No reflexes in lower  extremities Pupils/corneals/coug/gag intact No facial droop  Laboratory Studies:   Basic Metabolic Panel: Recent Labs  Lab 04/29/19 0601 04/29/19 0601 04/29/19 6256 04/30/19 0531 04/30/19 0531 05/01/19 0528 05/02/19 0419 05/04/19 0628 05/05/19 0611  NA 137  --   --  136  --  138  --  139 135  K 3.1*  --   --  3.6  --  3.9  --  4.2 4.4  CL 97*  --   --  99  --  101  --  99 95*  CO2 29  --   --  29  --  27  --  28 28  GLUCOSE 144*  --   --  190*  --  172*  --  195* 217*  BUN 6  --   --  10  --  9  --  8 10  CREATININE 0.36*   < >  --  <0.30*  --  0.32* 0.35* <0.30* 0.32*  CALCIUM 9.4   < >  --  9.2   < > 9.2  --  9.6 9.7  MG  --   --  1.9 1.7  --   --   --  1.8 1.8  PHOS  --   --   --   --   --   --   --  3.7  --    < > = values in this interval not displayed.    Liver Function Tests: Recent Labs  Lab 05/04/19 0628  AST 43*  ALT 100*  ALKPHOS 57  BILITOT 0.5  PROT 7.7  ALBUMIN 2.4*  No results for input(s): LIPASE, AMYLASE in the last 168 hours. No results for input(s): AMMONIA in the last 168 hours.  CBC: Recent Labs  Lab 04/29/19 0601 04/30/19 0531 05/01/19 0624 05/04/19 0628 05/05/19 0611  WBC 10.3 10.6* 9.3 9.4 7.9  NEUTROABS 6.7 7.1 5.8 5.2 4.8  HGB 10.4* 9.7* 10.3* 9.9* 11.7*  HCT 33.6* 32.0* 34.6* 32.2* 38.3*  MCV 85.5 85.6 86.7 85.2 85.1  PLT 397 404* 369 394 401*    Cardiac Enzymes: No results for input(s): CKTOTAL, CKMB, CKMBINDEX, TROPONINI in the last 168 hours.  BNP: Invalid input(s): POCBNP  CBG: Recent Labs  Lab 05/04/19 1224 05/04/19 1806 05/04/19 1928 05/04/19 2349 05/05/19 0415  GLUCAP 198* 220* 235* 223* 226*    Microbiology: Results for orders placed or performed during the hospital encounter of 04/27/19  Blood Culture (routine x 2)     Status: None   Collection Time: 04/27/19 12:43 PM   Specimen: BLOOD  Result Value Ref Range Status   Specimen Description BLOOD LEFT ANTECUBITAL  Final   Special Requests   Final     BOTTLES DRAWN AEROBIC AND ANAEROBIC Blood Culture results may not be optimal due to an inadequate volume of blood received in culture bottles   Culture   Final    NO GROWTH 5 DAYS Performed at [facility], [address] [place], [place] [address]    Report Status 05/02/2019 FINAL  Final  Blood Culture (routine x 2)     Status: Abnormal   Collection Time: 04/27/19 12:43 PM   Specimen: BLOOD  Result Value Ref Range Status   Specimen Description   Final    BLOOD BLOOD RIGHT FOREARM Performed at [facility], [address], [place], [place] [address]    Special Requests   Final    BOTTLES DRAWN AEROBIC AND ANAEROBIC Blood Culture adequate volume Performed at [facility], [address] [place], [place] [address]    Culture  Setup Time   Final    AEROBIC BOTTLE ONLY GRAM POSITIVE RODS CRITICAL RESULT CALLED TO, READ BACK BY AND VERIFIED WITH: SCOTT HALL ON 04/28/19 AT [address] [facility] Performed at [facility], [address] [place], [place] [address]    Culture (A)  Final    DIPHTHEROIDS(CORYNEBACTERIUM SPECIES) Standardized susceptibility testing for this organism is not available. Performed at [facility], [address], [place], [place] [address]    Report Status 05/01/2019 FINAL  Final  Urine culture     Status: Abnormal   Collection Time: 04/27/19 12:43 PM   Specimen: In/Out Cath Urine  Result Value Ref Range Status   Specimen Description   Final    IN/OUT CATH URINE Performed at [facility], [address] [place], [place] [address]    Special Requests   Final    NONE Performed at [facility], [address] [place], [place] [address]    Culture MULTIPLE SPECIES PRESENT, SUGGEST RECOLLECTION (A)  Final   Report Status 04/29/2019 FINAL  Final  Respiratory Panel by RT PCR (Flu A&B, Covid) -     Status: None   Collection Time: 04/27/19 12:43 PM  Result Value Ref Range Status   SARS Coronavirus 2 by  RT PCR NEGATIVE NEGATIVE Final    Comment: (NOTE) SARS-CoV-2 target nucleic acids are NOT DETECTED. The SARS-CoV-2 RNA is generally detectable in upper respiratoy specimens during the acute phase of infection. The lowest concentration of SARS-CoV-2 viral copies this assay  can detect is 131 copies/mL. A negative result does not preclude SARS-Cov-2 infection and should not be used as the sole basis for treatment or other patient management decisions. A negative result may occur with  improper specimen collection/handling, submission of specimen other than nasopharyngeal swab, presence of viral mutation(s) within the areas targeted by this assay, and inadequate number of viral copies (<131 copies/mL). A negative result must be combined with clinical observations, patient history, and epidemiological information. The expected result is Negative. Fact Sheet for Patients:  https://www.moore.com/ Fact Sheet for Healthcare Providers:  https://www.young.biz/ This test is not yet ap proved or cleared by the [place] FDA and  has been authorized for detection and/or diagnosis of SARS-CoV-2 by FDA under an Emergency Use Authorization (EUA). This EUA will remain  in effect (meaning this test can be used) for the duration of the COVID-19 declaration under Section 564(b)(1) of the Act, 21 U.S.C. section 360bbb-3(b)(1), unless the authorization is terminated or revoked sooner.    Influenza A by PCR NEGATIVE NEGATIVE Final   Influenza B by PCR NEGATIVE NEGATIVE Final    Comment: (NOTE) The Xpert Xpress SARS-CoV-2/FLU/RSV assay is intended as an aid in  the diagnosis of influenza from Nasopharyngeal swab specimens and  should not be used as a sole basis for treatment. Nasal washings and  aspirates are unacceptable for Xpert Xpress SARS-CoV-2/FLU/RSV  testing. Fact Sheet for Patients: https://www.moore.com/ Fact Sheet for Healthcare  Providers: https://www.young.biz/ This test is not yet approved or cleared by the [place] FDA and  has been authorized for detection and/or diagnosis of SARS-CoV-2 by  FDA under an Emergency Use Authorization (EUA). This EUA will remain  in effect (meaning this test can be used) for the duration of the  Covid-19 declaration under Section 564(b)(1) of the Act, 21  U.S.C. section 360bbb-3(b)(1), unless the authorization is  terminated or revoked. Performed at [facility], [address] [place], [place] [address]   C difficile quick scan w PCR reflex     Status: None   Collection Time: 04/27/19  1:10 PM   Specimen: Stool  Result Value Ref Range Status   C Diff antigen NEGATIVE NEGATIVE Final   C Diff toxin NEGATIVE NEGATIVE Final   C Diff interpretation No C. difficile detected.  Final    Comment: Performed at [facility], [address] [place], [place] [address]  MRSA PCR Screening     Status: None   Collection Time: 04/29/19  3:47 PM   Specimen: Nasopharyngeal  Result Value Ref Range Status   MRSA by PCR NEGATIVE NEGATIVE Final    Comment:        The GeneXpert MRSA Assay (FDA approved for NASAL specimens only), is one component of a comprehensive MRSA colonization surveillance program. It is not intended to diagnose MRSA infection nor to guide or monitor treatment for MRSA infections. Performed at [facility], [address] [place], [place] [address]     Coagulation Studies: No results for input(s): LABPROT, INR in the last 72 hours.  Urinalysis: No results for input(s): COLORURINE, LABSPEC, PHURINE, GLUCOSEU, HGBUR, BILIRUBINUR, KETONESUR, PROTEINUR, UROBILINOGEN, NITRITE, LEUKOCYTESUR in the last 168 hours.  Invalid input(s): APPERANCEUR  Lipid Panel:  No results found for: CHOL, TRIG, HDL, CHOLHDL, VLDL, LDLCALC  HgbA1C: No results found for: HGBA1C  Urine Drug Screen:  No results found for: LABOPIA,  COCAINSCRNUR, LABBENZ, AMPHETMU, THCU, LABBARB  Alcohol Level: No results for input(s): ETH in the last 168 hours.  Imaging: No results found.   Assessment/Plan:  24 y.o. male with medical history significant for respiratory failure, failure to wean from ventilator requiring tracheostomy, insulin-dependent diabetes mellitus, shock requiring vasopressors, hypertension, Serratia urinary tract infection, stroke leading to quadriplegia, seizures, subdural hematoma, bilateral lower extremity DVT status post IVC filter placement 02/16/2019, dysphagia status post PEG tube placement, urinary retention with chronic indwelling Foley catheter, COVID-19 infection complicated by ARDS requiring ECMOand stroke. Patient has been in the hospital for 7 days with AMS likely in setting of above co morbidities.  Patient is on Keppra 1500 BID. Unclear when last seizure occurred.  Neurology consulted for Keppra dosing adjustment.   - No recent seizure reported.  - Mental status is likely not purely Keppra dosing related but mutliple metabolic problems. Keppra is weight based dosed as well  - Will decrease Keppra to 1gm BID to see if anyy mental status improvement.  - guarded prognostication given the multiple medical problems/infections.  - Further family discussions on placement/LTAC 05/05/2019, 9:51 AM

## 2019-05-05 NOTE — Progress Notes (Signed)
Assisted tele visit to patient with mother.  Neco Kling McEachran, RN  

## 2019-05-05 NOTE — Progress Notes (Addendum)
PROGRESS NOTE    Paul Hall  JAS:[id number] DOB: 06/10/1995 DOA: 04/27/2019  PCP: Cory Munch, PA-C    LOS - 8   Brief Narrative:  Medical records reviewed and are as summarized below: Paul Ludwig Withersis an 23 y.o.malewith medical history significant for respiratory failure, failure to wean from ventilator requiring tracheostomy, insulin-dependent diabetes mellitus, shock requiring vasopressors, hypertension, Serratia urinary tract infection, stroke leading to quadriplegia, seizures, subdural hematoma, bilateral lower extremity DVT status post IVC filter placement 02/16/2019, dysphagia status post PEG tube placement, urinary retention with chronic indwelling Foley catheter, ALPFX-90 infection complicated by ARDS requiring ECMOand stroke. He was admitted to [facility] in [facility] 10/18/2020and discharged to acute rehab on 03/15/2019. According to his wife at the bedside, patient was discharged from the rehab facility a few weeks ago.  Patient is nonverbal and unable to provide any history so history was obtained from chart review andhis wife at the bedside. His wife said patient was brought to the emergency room at the recommendation of his PCP for evaluation of sacral decubitus ulcer. She saidhe developed a sacral ulcer from his hospitalization at [facility]. She said the patient could not be turned while he was on ECMO and that led to the development of the ulcer. She also said that there has been intermittent drainage from the wound. She also said PCP was concerned about the tracheostomy and said that patient no longer required a tracheostomy wanted it to be removed. She does not report any fever or vomiting. His wife said that patient is nonverbal but sometimes he can move his extremities from side to side.  Admitted with sepsis secondary to sacral wound infection.  Subjective 1/31: Patient had his eyes open when I entered the room, but close them as soon  as I spoke to him and did not open them again.  No acute events reported overnight.  Nonverbal.  No family at bedside during encounter.  This morning when nurse tried patient, found wound VAC had come unsealed again.  Assessment & Plan:   Principal Problem:   Sepsis ([place]) Active Problems:   H/O insulin dependent diabetes mellitus   History of stroke   Seizure disorder ([place])   Decubitus ulcer of sacral region, unstageable ([place])   Goals of care, counseling/discussion   Palliative care encounter   Sepsis due to unstageable sacral woundinfection/leukocytosis: Leukocytosis hasresolved.  General surgery consulted, no surgery recommended, wound vac placed. Surgery signed off. No evidence of osteomyelitis on CT pelvisand blood cultures NGTD.Patient remains tachycardic but this is baseline per his wife (usually HR 110's to 120), and tachypneic at times. --stopempiric Vanc/Zosyn- completed 7 day course --monitor clinically for s/sx's of infection --maintain wound vac --Likely would be best managed at [facility].  Continue discussions and planning on disposition.   Positive Fluid Balance - net positive almost 4 L to date. Good effect from IV Lasix given yesterday.   No worsening hypoxia to suggest pulmonary edema.    Will give repeat Lasix IV 40 mg today.  Hypotension:Resolved.  Normotensive without medications. --Hold amlodipine, chlorthalidone and Coreg  Hypokalemia:replaced --monitor BMP daily and replace as needed  Insulin-dependent type II diabetes mellitus:  Uncontrolled on resistant sliding scale alone. --start Lantus 15 units BID --continue resistant sliding scale Novolog --monitor CBG's --adjust insulin as needed for goal 140-180  History of stroke with quadriplegia (complication of WIOXB-35 infection): Continue aspirin --continue Keppra for seizure prophylaxis --Neurology saw patient and reduced Keppra dose from 1500 to 1000 mg twice daily --  Neurology's  assessment is that patient's mental status is likely not due to Keppra alone.  No history ofSeizure disorder: Keppra for seizure prevention in setting of stroke(s), per wife.  Dysphagia: Continue enteral nutrition.   Urinary retentionwith abnormal urinalysis: Chronic indwelling Foley catheter in place.  --Abnormal urinalysis is probably due to indwelling Foley catheter.  --No leukocytosis or signs of developing infection now off antibiotics --monitor clinically for s/sx's of infection --Continue indwelling Foley  Chronic hypoxemic respiratory failure s/p tracheostomy: He is onFiO2 28% and5 L/min oxygen via trach collar. Minimal of 28% FiO2 is required for humidity through trach collarper RT. His wife said he has intermittent oxygen desaturation especially when he is sleeping and they were told in the past that this was from obstructive sleep apnea.   --recommend sleep study in the future and treating OSA  History of bilateral lower extremity DVT status post IVC filter placement:  Continue Lovenox for DVT prophylaxis  Hyponatremia: Improved.  Morbid Obesity - BMI 65.7   DVT prophylaxis:Lovenox Code Status: Full Code Family Communication:none at bedside Disposition Plan:Pending home health set up for wound vac.   Consultants:  General surgery - signed off   Antimicrobials:  Cefepime 1/23 >>1/29  Vancomycin 1/23 >>1/29  Flagyl once 1/23   Objective: Vitals:   05/05/19 0400 05/05/19 0500 05/05/19 0600 05/05/19 0700  BP: 126/79 123/78 121/84 122/75  Pulse: (!) 114 (!) 115 (!) 115 (!) 114  Resp:      Temp:      TempSrc:      SpO2: 99% 100% 100% 100%  Weight:      Height:        Intake/Output Summary (Last 24 hours) at 05/05/2019 0811 Last data filed at 05/05/2019 0200 Gross per 24 hour  Intake 1098.67 ml  Output 3050 ml  Net -1951.33 ml   Filed Weights   05/02/19 0329 05/03/19 0419 05/04/19 0500  Weight: (!) 205 kg  (!) 209.6 kg (!) 209.1 kg    Examination:  General exam: awake, alert, no acute distress, morbidly obese HEENT: hearing grossly normal, left ear with small area of erythema/irritation Respiratory system: clear to auscultation anteriorly, trach present without visible secretions, normal respiratory effort. Cardiovascular system: normal S1/S2, RRR, left greater than right lower extremity edema.   Gastrointestinal system: soft, non-tender, non-distended abdomen, normal bowel sounds.  Rectal tube in place.  Wound VAC un-sealed. Central nervous system: Unable to evaluate due to patient nonverbal and not following commands Extremities: moves none, offloading boots bilaterally Skin: dry, intact, normal temperature,     Data Reviewed: I have personally reviewed following labs and imaging studies  CBC: Recent Labs  Lab 04/29/19 0601 04/30/19 0531 05/01/19 0624 05/04/19 0628 05/05/19 0611  WBC 10.3 10.6* 9.3 9.4 7.9  NEUTROABS 6.7 7.1 5.8 5.2 4.8  HGB 10.4* 9.7* 10.3* 9.9* 11.7*  HCT 33.6* 32.0* 34.6* 32.2* 38.3*  MCV 85.5 85.6 86.7 85.2 85.1  PLT 397 404* 369 394 401*   Basic Metabolic Panel: Recent Labs  Lab 04/29/19 0601 04/29/19 0601 04/29/19 0620 04/30/19 0531 05/01/19 0528 05/02/19 0419 05/04/19 0628 05/05/19 0611  NA 137  --   --  136 138  --  139 135  K 3.1*  --   --  3.6 3.9  --  4.2 4.4  CL 97*  --   --  99 101  --  99 95*  CO2 29  --   --  29 27  --  28 28  GLUCOSE 144*  --   --  190* 172*  --  195* 217*  BUN 6  --   --  10 9  --  8 10  CREATININE 0.36*   < >  --  <0.30* 0.32* 0.35* <0.30* 0.32*  CALCIUM 9.4  --   --  9.2 9.2  --  9.6 9.7  MG  --   --  1.9 1.7  --   --  1.8 1.8  PHOS  --   --   --   --   --   --  3.7  --    < > = values in this interval not displayed.   GFR: Estimated Creatinine Clearance: 258.8 mL/min (A) (by C-G formula based on SCr of 0.32 mg/dL (L)). Liver Function Tests: Recent Labs  Lab 05/04/19 0628  AST 43*  ALT 100*  ALKPHOS  57  BILITOT 0.5  PROT 7.7  ALBUMIN 2.4*   No results for input(s): LIPASE, AMYLASE in the last 168 hours. No results for input(s): AMMONIA in the last 168 hours. Coagulation Profile: No results for input(s): INR, PROTIME in the last 168 hours. Cardiac Enzymes: No results for input(s): CKTOTAL, CKMB, CKMBINDEX, TROPONINI in the last 168 hours. BNP (last 3 results) No results for input(s): PROBNP in the last 8760 hours. HbA1C: No results for input(s): HGBA1C in the last 72 hours. CBG: Recent Labs  Lab 05/04/19 1224 05/04/19 1806 05/04/19 1928 05/04/19 2349 05/05/19 0415  GLUCAP 198* 220* 235* 223* 226*   Lipid Profile: No results for input(s): CHOL, HDL, LDLCALC, TRIG, CHOLHDL, LDLDIRECT in the last 72 hours. Thyroid Function Tests: No results for input(s): TSH, T4TOTAL, FREET4, T3FREE, THYROIDAB in the last 72 hours. Anemia Panel: No results for input(s): VITAMINB12, FOLATE, FERRITIN, TIBC, IRON, RETICCTPCT in the last 72 hours. Sepsis Labs: No results for input(s): PROCALCITON, LATICACIDVEN in the last 168 hours.  Recent Results (from the past 240 hour(s))  Blood Culture (routine x 2)     Status: None   Collection Time: 04/27/19 12:43 PM   Specimen: BLOOD  Result Value Ref Range Status   Specimen Description BLOOD LEFT ANTECUBITAL  Final   Special Requests   Final    BOTTLES DRAWN AEROBIC AND ANAEROBIC Blood Culture results may not be optimal due to an inadequate volume of blood received in culture bottles   Culture   Final    NO GROWTH 5 DAYS Performed at [facility], [address], [place], [place] [address]    Report Status 05/02/2019 FINAL  Final  Blood Culture (routine x 2)     Status: Abnormal   Collection Time: 04/27/19 12:43 PM   Specimen: BLOOD  Result Value Ref Range Status   Specimen Description   Final    BLOOD BLOOD RIGHT FOREARM Performed at [facility], [address], [place], [place] [address]    Special Requests   Final      BOTTLES DRAWN AEROBIC AND ANAEROBIC Blood Culture adequate volume Performed at [facility], [address] [place], [place] [address]    Culture  Setup Time   Final    AEROBIC BOTTLE ONLY GRAM POSITIVE RODS CRITICAL RESULT CALLED TO, READ BACK BY AND VERIFIED WITH: SCOTT HALL ON 04/28/19 AT [address] [facility] Performed at [facility], [address], [place], [place] [address]    Culture (A)  Final    DIPHTHEROIDS(CORYNEBACTERIUM SPECIES) Standardized susceptibility testing for this organism is not available. Performed at [facility], [address] [place],  [place] [address]    Report Status 05/01/2019 FINAL  Final  Urine culture     Status: Abnormal   Collection Time: 04/27/19 12:43 PM   Specimen: In/Out Cath Urine  Result Value Ref Range Status   Specimen Description   Final    IN/OUT CATH URINE Performed at [facility], [address] [place], [place] [address]    Special Requests   Final    NONE Performed at [facility], [address] [place], [place] [address]    Culture MULTIPLE SPECIES PRESENT, SUGGEST RECOLLECTION (A)  Final   Report Status 04/29/2019 FINAL  Final  Respiratory Panel by RT PCR (Flu A&B, Covid) -     Status: None   Collection Time: 04/27/19 12:43 PM  Result Value Ref Range Status   SARS Coronavirus 2 by RT PCR NEGATIVE NEGATIVE Final    Comment: (NOTE) SARS-CoV-2 target nucleic acids are NOT DETECTED. The SARS-CoV-2 RNA is generally detectable in upper respiratoy specimens during the acute phase of infection. The lowest concentration of SARS-CoV-2 viral copies this assay can detect is 131 copies/mL. A negative result does not preclude SARS-Cov-2 infection and should not be used as the sole basis for treatment or other patient management decisions. A negative result may occur with  improper specimen collection/handling, submission of specimen other than nasopharyngeal swab, presence of viral mutation(s)  within the areas targeted by this assay, and inadequate number of viral copies (<131 copies/mL). A negative result must be combined with clinical observations, patient history, and epidemiological information. The expected result is Negative. Fact Sheet for Patients:  https://www.moore.com/ Fact Sheet for Healthcare Providers:  https://www.young.biz/ This test is not yet ap proved or cleared by the [place] FDA and  has been authorized for detection and/or diagnosis of SARS-CoV-2 by FDA under an Emergency Use Authorization (EUA). This EUA will remain  in effect (meaning this test can be used) for the duration of the COVID-19 declaration under Section 564(b)(1) of the Act, 21 U.S.C. section 360bbb-3(b)(1), unless the authorization is terminated or revoked sooner.    Influenza A by PCR NEGATIVE NEGATIVE Final   Influenza B by PCR NEGATIVE NEGATIVE Final    Comment: (NOTE) The Xpert Xpress SARS-CoV-2/FLU/RSV assay is intended as an aid in  the diagnosis of influenza from Nasopharyngeal swab specimens and  should not be used as a sole basis for treatment. Nasal washings and  aspirates are unacceptable for Xpert Xpress SARS-CoV-2/FLU/RSV  testing. Fact Sheet for Patients: https://www.moore.com/ Fact Sheet for Healthcare Providers: https://www.young.biz/ This test is not yet approved or cleared by the [place] FDA and  has been authorized for detection and/or diagnosis of SARS-CoV-2 by  FDA under an Emergency Use Authorization (EUA). This EUA will remain  in effect (meaning this test can be used) for the duration of the  Covid-19 declaration under Section 564(b)(1) of the Act, 21  U.S.C. section 360bbb-3(b)(1), unless the authorization is  terminated or revoked. Performed at [facility], [address] [place], [place] [address]   C difficile quick scan w PCR reflex     Status: None    Collection Time: 04/27/19  1:10 PM   Specimen: Stool  Result Value Ref Range Status   C Diff antigen NEGATIVE NEGATIVE Final   C Diff toxin NEGATIVE NEGATIVE Final   C Diff interpretation No C. difficile detected.  Final    Comment: Performed at [facility], [address], [place], [place] [address]  MRSA PCR Screening  Status: None   Collection Time: 04/29/19  3:47 PM   Specimen: Nasopharyngeal  Result Value Ref Range Status   MRSA by PCR NEGATIVE NEGATIVE Final    Comment:        The GeneXpert MRSA Assay (FDA approved for NASAL specimens only), is one component of a comprehensive MRSA colonization surveillance program. It is not intended to diagnose MRSA infection nor to guide or monitor treatment for MRSA infections. Performed at [facility], [address] [place], [place] [address]          Radiology Studies: No results found.      Scheduled Meds: . aspirin EC  81 mg Oral Daily  . Chlorhexidine Gluconate Cloth  6 each Topical Daily  . enoxaparin (LOVENOX) injection  40 mg Subcutaneous Q12H  . famotidine  20 mg Per Tube BID  . free water  100 mL Per Tube Q4H  . insulin aspart  0-20 Units Subcutaneous Q4H  . levETIRAcetam  1,500 mg Per Tube BID  . magnesium oxide  800 mg Per Tube Daily  . mouth rinse  15 mL Mouth Rinse q12n4p  . pneumococcal 23 valent vaccine  0.5 mL Intramuscular Tomorrow-1000   Continuous Infusions: . sodium chloride Stopped (05/01/19 0705)  . feeding supplement (GLUCERNA 1.5 CAL) 80 mL/hr at 05/03/19 1200     LOS: 8 days    Time spent: 30 minutes    Pennie BanterKelly A Caramia Boutin, DO [facility]   If 7PM-7AM, please contact night-coverage [email or web address] 05/05/2019, 8:11 AM

## 2019-05-05 NOTE — Consult Note (Signed)
WOC Nurse wound follow up Wound type: Sacral wound with NPWT dressing applied Friday.  Bedside RN Myra called to report that NPWT device was alarming "occlusion". I am not able to return to the unit today, so she was instructed to remove Dressing and replace with a normal saline moistened gauze dressing topped with a dry dressing, ABD and secured with paper tape.  WOC Nurse will see on Monday and replace dressing with bridge to hip.  WOC nursing team will follow, and will remain available to this patient, the nursing and medical teams.   Thanks, Ladona Mow, MSN, RN, GNP, [place]  Pager# [phone]

## 2019-05-06 LAB — COMPREHENSIVE METABOLIC PANEL
ALT: 102 U/L — ABNORMAL HIGH (ref 0–44)
AST: 38 U/L (ref 15–41)
Albumin: 2.3 g/dL — ABNORMAL LOW (ref 3.5–5.0)
Alkaline Phosphatase: 59 U/L (ref 38–126)
Anion gap: 9 (ref 5–15)
BUN: 11 mg/dL (ref 6–20)
CO2: 32 mmol/L (ref 22–32)
Calcium: 9.6 mg/dL (ref 8.9–10.3)
Chloride: 95 mmol/L — ABNORMAL LOW (ref 98–111)
Creatinine, Ser: 0.33 mg/dL — ABNORMAL LOW (ref 0.61–1.24)
GFR calc Af Amer: >60 mL/min (ref >60)
GFR calc non Af Amer: >60 mL/min (ref >60)
Glucose, Bld: 260 mg/dL — ABNORMAL HIGH (ref 70–99)
Potassium: 4.8 mmol/L (ref 3.5–5.1)
Sodium: 136 mmol/L (ref 135–145)
Total Bilirubin: 0.4 mg/dL (ref 0.3–1.2)
Total Protein: 7.8 g/dL (ref 6.5–8.1)

## 2019-05-06 LAB — GLUCOSE, CAPILLARY
Glucose-Capillary: 248 mg/dL — ABNORMAL HIGH (ref 70–99)
Glucose-Capillary: 223 mg/dL — ABNORMAL HIGH (ref 70–99)
Glucose-Capillary: 250 mg/dL — ABNORMAL HIGH (ref 70–99)
Glucose-Capillary: 243 mg/dL — ABNORMAL HIGH (ref 70–99)
Glucose-Capillary: 267 mg/dL — ABNORMAL HIGH (ref 70–99)

## 2019-05-06 MED ORDER — MORPHINE SULFATE (PF) 2 MG/ML IV SOLN
2.0000 mg | INTRAVENOUS | Status: DC | PRN
  Filled 2019-05-06: qty 1

## 2019-05-06 MED ORDER — INSULIN GLARGINE 100 UNIT/ML ~~LOC~~ SOLN
25.0000 [IU] | Freq: Two times a day (BID) | SUBCUTANEOUS | Status: DC
  Administered 2019-05-06 – 2019-05-07 (×2): 25 [IU] via SUBCUTANEOUS
  Filled 2019-05-06 (×3): qty 0.25

## 2019-05-06 MED ORDER — JUVEN PO PACK
1.0000 | PACK | Freq: Two times a day (BID) | ORAL | Status: DC
  Administered 2019-05-07 – 2019-05-10 (×8): 1

## 2019-05-06 NOTE — TOC Progression Note (Addendum)
Transition of Care [facility]) - Progression Note    Patient Details  Name: Paul Hall MRN: [id number] Date of Birth: 1995/06/13  [facility] CM/SW Contact  [name], LCSW Phone Number: 05/06/2019, 2:29 PM  Clinical Narrative: Patient's wife has a cousin that is a home health RN and has agreed to provide his needed care. Determining if patient will discharge with wound vac or wet-to-dry dressing with packing. Asked wife to talk with cousin and see what she thinks would be more manageable at home.  2:59 pm: Patient's wife's cousin is prepared to start services when needed. She just requested orders on how to manage wound vac, wound vac supplies, and foley cath size. No call back from [facility] yet so tried calling again. Representative CSW spoke to said he is not managed in [place] but his information was pulling up from [employer] of [place]. CSW called and left a voicemail for their case manager.  Expected Discharge Plan: Home/Self Care Barriers to Discharge: Barriers Resolved  Expected Discharge Plan and Services Expected Discharge Plan: Home/Self Care   Discharge Planning Services: CM Consult   Living arrangements for the past 2 months: Single Family Home, Skilled Nursing Facility                         Representative spoke with at DME Agency: Spoke with [name] regarding equipment being picked up, he reports that he will take care of it         Representative spoke with at [facility] Agency: [name], [facility]   Social Determinants of Health (SDOH) Interventions    Readmission Risk Interventions No flowsheet data found.

## 2019-05-06 NOTE — Consult Note (Addendum)
WOC Nurse follow-up consult Note: Reason for Consult: Bedside nurse discontinued Vac yesterday since it was leaking, according to progress notes.  It will be difficult to maintain a seal, since wound is located close to the rectum and patient is incontinent of stool.  Flexiseal is in place to attempt to contain liquid stool, but still leaks around the insertion site and patient is frequently moist to buttocks skin.  Wound type: Chronic stage 4 pressure injury to sacrum; 85% red, 15% yellow/brown slough to inner wound bed. Large amt brown drainage with strong foul odor in previous packing, since Vac was removed yesterday.  Bone palpable with swab. Pressure Injury POA: Yes Measurement: 7X8X5.5cm with 3 cm undermining to wound edges.  Dressing procedure/placement/frequency: Applied one piece black foam to inner wound with a barrier ring around the edges to attempt to maintain a seal, and another applied to flank to bridge track pad and reduce pressure.  Cont suction applied at .  Pt tolerated without apparent discomfort.  4 people required to turn and apply Vac related to patient's high BMI. Pt is on a Bariatric air mattress to reduce pressureDiscussed plan of care with physician, bedside nurse, pt's wife, and social worker at the bedside; it is difficult to maintain a seal on the Vac when the patient plans to discharge home. If the negative pressure wound therapy (Vac) is a barrier to discharge, then pt can use moist gauze packing to the wound, and this should be changed every day. This is often the only solution when patients are frequently incontinent of stool and a Vac seal cannot be maintained.   One hour spent performing this consult. WOC team will continue to change the dressing Q M/W/F while patient is in the hospital.  Cammie Mcgee MSN, RN, [place], [place], CNS [phone]

## 2019-05-06 NOTE — Progress Notes (Signed)
Inpatient Diabetes Program Recommendations  AACE/ADA: New Consensus Statement on Inpatient Glycemic Control (2015)  Target Ranges:  Prepandial:   less than 140 mg/dL      Peak postprandial:   less than 180 mg/dL (1-2 hours)      Critically ill patients:  140 - 180 mg/dL   Lab Results  Component Value Date   GLUCAP 223 (H) 05/06/2019    Review of Glycemic Control  Diabetes history: DM 2 Outpatient Diabetes medications: Novolin R 20 units tid with meals Current orders for Inpatient glycemic control:  Lantus 15 units bid Novolog 0-20 units Q 4 hours  Tube Feeds: Glucerna 80 ml/hour  Inpatient Diabetes Program Recommendations:    Noted second dose of Lantus 15 units given this am.  If glucose trends remain elevated, consider adding Novolog 4 units Q4 hours Tube Feed coverage with parameters (hold if tube feeds are stopped or held).  Thanks,  Christena Deem RN, MSN, BC-ADM Inpatient Diabetes Coordinator Team Pager [phone] (8a-5p)

## 2019-05-06 NOTE — Progress Notes (Signed)
   05/06/19 1400  Clinical Encounter Type  Visited With Patient and family together  Visit Type Follow-up  Referral From Chaplain  Consult/Referral To Chaplain  Chaplain had a short visit with patient and wife. Wife was on the telephone, so chaplain told her if she needs anything do not hesitate to contact her. Chaplain offered pastoral presence and empathy.

## 2019-05-06 NOTE — Progress Notes (Addendum)
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 He was admitted to [facility] in [facility] 10/18/2020and discharged to acute rehab on 03/15/2019. According to his wife at the bedside, patient was discharged from the rehab facility a few weeks ago.  Patient is nonverbal and unable to provide any history so history was obtained from chart review andhis wife at the bedside. His wife said patient was brought to the emergency room at the recommendation of his PCP for evaluation of sacral decubitus ulcer. She saidhe developed a sacral ulcerfrom his hospitalization at [facility]. She said the patient could not be turned while he was on ECMO and that led to the development of the ulcer. She also said that there has been intermittent drainage from the wound. She also said PCP was concerned about the tracheostomy and said that patient no longer required a tracheostomy wanted it to be removed. She does not report any fever or vomiting. His wife said that patient is nonverbal but sometimes he can move his extremities from side to side.  Admitted with sepsis secondary to sacral wound infection.  Subjective 2/1: Patient seen with wife at bedside this morning.  No acute events reported  overnight.  Present for wound care today, and wound VAC in, and sealed again.  It was replaced.  Patient was seen moving his right arm all over earlier this morning.  Wife has video of this on her phone.  Assessment & Plan:   Principal Problem:   Sepsis (HCC) Active Problems:   H/O insulin dependent diabetes mellitus   History of stroke   Seizure disorder (HCC)   Decubitus ulcer of sacral region, unstageable (HCC)   Goals of care, counseling/discussion   Palliative care encounter   Sepsis due to unstageable sacral woundinfection/leukocytosis: Leukocytosis hasresolved.  General surgery consulted, no surgery recommended, wound vac placed. Surgery signed off. No evidence of osteomyelitis on CT pelvisand blood cultures NGTD.Patient remains tachycardic but this is baseline per his wife (usually HR 110's to 120), and tachypneic at times. --stopempiric Vanc/Zosyn- completed 7 day course --monitor clinically for s/sx's of infection --maintain wound vac vs wet-to-dry dressings -whichever will be best managed at home   Positive Fluid Balance-improved.  Was net positive over 4 L, today net +1.6 L, after IV Lasix given past 2 days.  Lower extremity edema improved.  Hold further diuresis and reassess tomorrow.  Hypotension:Resolved.Normotensive without medications. --Hold amlodipine, chlorthalidone and Coreg  Hypokalemia:replaced --monitor BMP daily and replace as needed  Insulin-dependent type II diabetes mellitus:  Uncontrolled on resistant sliding scale alone. --Increase Lantus to 30 units BID --continue resistant sliding scale Novolog --monitor CBG's --adjust insulin as needed for goal 140-180  History of stroke with quadriplegia (complication of Covid-19 infection): Continue aspirin --continue Keppra for seizure prophylaxis --Neurology saw patient and reduced Keppra dose from 1500 to 1000 mg twice daily --Neurology's assessment is that patient's mental status is  likely not due to  Keppra alone.  No history ofSeizure disorder: Keppra for seizure prevention in setting of stroke(s), per wife.  Dysphagia: Continue enteral nutrition.   Urinary retentionwith abnormal urinalysis: Chronic indwelling Foley catheter in place.  --Abnormal urinalysis is probably due to indwelling Foley catheter.  --No leukocytosis or signs of developing infection now off antibiotics --monitor clinically for s/sx's of infection --Continue indwelling Foley  Chronic hypoxemic respiratory failure s/p tracheostomy: He is onFiO2 28% and5 L/min oxygen via trach collar. Minimal of 28% FiO2 is required for humidity through trach collarper RT. His wife said he has intermittent oxygen desaturation especially when he is sleeping and they were told in the past that this was from obstructive sleep apnea.   --recommend sleep study in the future and treating OSA  History of bilateral lower extremity DVT status post IVC filter placement:  Continue Lovenox for DVT prophylaxis  Hyponatremia: Improved.  Morbid Obesity - BMI 65.7   DVT prophylaxis:Lovenox Code Status: Full Code Family Communication:none at bedside Disposition Plan:Expect he can be discharged and 1-2 more days pending decision regarding wound VAC versus wet-to-dry dressings at home to be managed by family and patient's cousin, who is a home Geneticist, molecular.  He is otherwise medically stable for discharge.   Consultants:  General surgery - signed off   Antimicrobials:  Cefepime 1/23 >>1/29  Vancomycin 1/23 >>1/29  Flagyl once 1/23   Objective: Vitals:   05/06/19 1300 05/06/19 1400 05/06/19 1500 05/06/19 1600  BP: 123/78 (!) 136/93 108/70 (!) 122/98  Pulse: (!) 122 (!) 123 (!) 125 (!) 125  Resp:      Temp:      TempSrc:      SpO2: 100% 100% 99% 97%  Weight:      Height:        Intake/Output Summary (Last 24 hours) at 05/06/2019 1743 Last data filed at 05/06/2019  0300 Gross per 24 hour  Intake 973.33 ml  Output 1450 ml  Net -476.67 ml   Filed Weights   05/02/19 0329 05/03/19 0419 05/04/19 0500  Weight: (!) 205 kg (!) 209.6 kg (!) 209.1 kg    Examination:  General exam: awake, alert, no acute distress HEENT: Opens his eyes more today, hearing grossly normal, trach present without visible secretions Respiratory system: clear to auscultation anteriorly, no wheezes, rales or rhonchi, normal respiratory effort. Cardiovascular system: normal S1/S2, RRR, improved lower extremity edema.   Gastrointestinal system: soft, non-tender, non-distended abdomen Central nervous system: alert, does not follow commands Extremities: Offloading boots in place, onychomycosis, lower extremity edema improved  Wound Photo of today, 05/06/2019     Data Reviewed: I have personally reviewed following labs and imaging studies  CBC: Recent Labs  Lab 04/30/19 0531 05/01/19 0624 05/04/19 0628 05/05/19 0611  WBC 10.6* 9.3 9.4 7.9  NEUTROABS 7.1 5.8 5.2 4.8  HGB 9.7* 10.3* 9.9* 11.7*  HCT 32.0* 34.6* 32.2* 38.3*  MCV 85.6 86.7 85.2 85.1  PLT 404* 369 394 401*   Basic Metabolic Panel: Recent Labs  Lab 04/30/19 0531 04/30/19 0531 05/01/19 0528 05/02/19 0419 05/04/19 0628 05/05/19 0611 05/06/19 0443  NA 136  --  138  --  139 135 136  K 3.6  --  3.9  --  4.2 4.4 4.8  CL 99  --  101  --  99 95* 95*  CO2 29  --  27  --  28 28 32  GLUCOSE 190*  --  172*  --  195* 217* 260*  BUN 10  --  9  --  8 10 11   CREATININE <0.30*   < > 0.32* 0.35* <0.30* 0.32* 0.33*  CALCIUM 9.2  --  9.2  --  9.6 9.7 9.6  MG 1.7  --   --   --  1.8 1.8  --   PHOS  --   --   --   --  3.7  --   --    < > = values in this interval not displayed.   GFR: Estimated Creatinine Clearance: 258.8 mL/min (A) (by C-G formula based on SCr of 0.33 mg/dL (L)). Liver Function Tests: Recent Labs  Lab 05/04/19 0628 05/06/19 0443  AST 43* 38  ALT 100* 102*  ALKPHOS 57 59  BILITOT 0.5 0.4   PROT 7.7 7.8  ALBUMIN 2.4* 2.3*   No results for input(s): LIPASE, AMYLASE in the last 168 hours. No results for input(s): AMMONIA in the last 168 hours. Coagulation Profile: No results for input(s): INR, PROTIME in the last 168 hours. Cardiac Enzymes: No results for input(s): CKTOTAL, CKMB, CKMBINDEX, TROPONINI in the last 168 hours. BNP (last 3 results) No results for input(s): PROBNP in the last 8760 hours. HbA1C: No results for input(s): HGBA1C in the last 72 hours. CBG: Recent Labs  Lab 05/05/19 2333 05/06/19 0359 05/06/19 0810 05/06/19 1126 05/06/19 1617  GLUCAP 265* 248* 223* 250* 243*   Lipid Profile: No results for input(s): CHOL, HDL, LDLCALC, TRIG, CHOLHDL, LDLDIRECT in the last 72 hours. Thyroid Function Tests: No results for input(s): TSH, T4TOTAL, FREET4, T3FREE, THYROIDAB in the last 72 hours. Anemia Panel: No results for input(s): VITAMINB12, FOLATE, FERRITIN, TIBC, IRON, RETICCTPCT in the last 72 hours. Sepsis Labs: No results for input(s): PROCALCITON, LATICACIDVEN in the last 168 hours.  Recent Results (from the past 240 hour(s))  Blood Culture (routine x 2)     Status: None   Collection Time: 04/27/19 12:43 PM   Specimen: BLOOD  Result Value Ref Range Status   Specimen Description BLOOD LEFT ANTECUBITAL  Final   Special Requests   Final    BOTTLES DRAWN AEROBIC AND ANAEROBIC Blood Culture results may not be optimal due to an inadequate volume of blood received in culture bottles   Culture   Final    NO GROWTH 5 DAYS Performed at [facility], [address], [place], [place] [address]    Report Status 05/02/2019 FINAL  Final  Blood Culture (routine x 2)     Status: Abnormal   Collection Time: 04/27/19 12:43 PM   Specimen: BLOOD  Result Value Ref Range Status   Specimen Description   Final    BLOOD BLOOD RIGHT FOREARM Performed at [facility], [address], [place], [place] [address]    Special Requests   Final     BOTTLES DRAWN AEROBIC AND ANAEROBIC Blood Culture adequate volume Performed at [facility], [address] [place], [place] [address]    Culture  Setup Time   Final    AEROBIC BOTTLE ONLY GRAM POSITIVE RODS CRITICAL RESULT CALLED TO, READ BACK BY AND VERIFIED WITH: [place] ON 04/28/19 AT [address] [facility] Performed at [facility], [address] [place], [place] [address]    Culture (A)  Final    DIPHTHEROIDS(CORYNEBACTERIUM SPECIES) Standardized susceptibility testing for this organism is not available. Performed at [facility], [place] [address] [place], [place] [address]    Report Status 05/01/2019 FINAL  Final  Urine culture     Status:  Abnormal   Collection Time: 04/27/19 12:43 PM   Specimen: In/Out Cath Urine  Result Value Ref Range Status   Specimen Description   Final    IN/OUT CATH URINE Performed at [facility], [address] [place], [place] [address]    Special Requests   Final    NONE Performed at [facility], [address] [place], [place] [address]    Culture MULTIPLE SPECIES PRESENT, SUGGEST RECOLLECTION (A)  Final   Report Status 04/29/2019 FINAL  Final  Respiratory Panel by RT PCR (Flu A&B, Covid) -     Status: None   Collection Time: 04/27/19 12:43 PM  Result Value Ref Range Status   SARS Coronavirus 2 by RT PCR NEGATIVE NEGATIVE Final    Comment: (NOTE) SARS-CoV-2 target nucleic acids are NOT DETECTED. The SARS-CoV-2 RNA is generally detectable in upper respiratoy specimens during the acute phase of infection. The lowest concentration of SARS-CoV-2 viral copies this assay can detect is 131 copies/mL. A negative result does not preclude SARS-Cov-2 infection and should not be used as the sole basis for treatment or other patient management decisions. A negative result may occur with  improper specimen collection/handling, submission of specimen other than nasopharyngeal swab, presence of viral mutation(s)  within the areas targeted by this assay, and inadequate number of viral copies (<131 copies/mL). A negative result must be combined with clinical observations, patient history, and epidemiological information. The expected result is Negative. Fact Sheet for Patients:  https://www.moore.com/https://www.fda.gov/media/142436/download Fact Sheet for Healthcare Providers:  https://www.young.biz/https://www.fda.gov/media/142435/download This test is not yet ap proved or cleared by the [place] FDA and  has been authorized for detection and/or diagnosis of SARS-CoV-2 by FDA under an Emergency Use Authorization (EUA). This EUA will remain  in effect (meaning this test can be used) for the duration of the COVID-19 declaration under Section 564(b)(1) of the Act, 21 U.S.C. section 360bbb-3(b)(1), unless the authorization is terminated or revoked sooner.    Influenza A by PCR NEGATIVE NEGATIVE Final   Influenza B by PCR NEGATIVE NEGATIVE Final    Comment: (NOTE) The Xpert Xpress SARS-CoV-2/FLU/RSV assay is intended as an aid in  the diagnosis of influenza from Nasopharyngeal swab specimens and  should not be used as a sole basis for treatment. Nasal washings and  aspirates are unacceptable for Xpert Xpress SARS-CoV-2/FLU/RSV  testing. Fact Sheet for Patients: https://www.moore.com/https://www.fda.gov/media/142436/download Fact Sheet for Healthcare Providers: https://www.young.biz/https://www.fda.gov/media/142435/download This test is not yet approved or cleared by the [place] FDA and  has been authorized for detection and/or diagnosis of SARS-CoV-2 by  FDA under an Emergency Use Authorization (EUA). This EUA will remain  in effect (meaning this test can be used) for the duration of the  Covid-19 declaration under Section 564(b)(1) of the Act, 21  U.S.C. section 360bbb-3(b)(1), unless the authorization is  terminated or revoked. Performed at [facility], [address] [place], [place] [address]   C difficile quick scan w PCR reflex     Status: None    Collection Time: 04/27/19  1:10 PM   Specimen: Stool  Result Value Ref Range Status   C Diff antigen NEGATIVE NEGATIVE Final   C Diff toxin NEGATIVE NEGATIVE Final   C Diff interpretation No C. difficile detected.  Final    Comment: Performed at [facility], [address] [place], [place] [address]  MRSA PCR Screening     Status: None   Collection Time: 04/29/19  3:47 PM   Specimen: Nasopharyngeal  Result Value Ref Range Status   MRSA by PCR NEGATIVE NEGATIVE Final    Comment:        The GeneXpert MRSA Assay (FDA approved for NASAL specimens only), is one component of a comprehensive MRSA colonization surveillance program. It is not intended to diagnose MRSA infection nor to guide or monitor treatment for MRSA infections. Performed at [facility], [address], [place], [place] [address]          Radiology Studies: No results found.      Scheduled Meds: . aspirin EC  81 mg Oral Daily  . Chlorhexidine Gluconate Cloth  6 each Topical Daily  . enoxaparin (LOVENOX) injection  40 mg Subcutaneous Q12H  . famotidine  20 mg Per Tube BID  . free water  100 mL Per Tube Q4H  . insulin aspart  0-20 Units Subcutaneous Q4H  . insulin glargine  15 Units Subcutaneous BID  . levETIRAcetam  1,000 mg Per Tube BID  . magnesium oxide  800 mg Per Tube Daily  . mouth rinse  15 mL Mouth Rinse q12n4p  . [START ON 05/07/2019] nutrition supplement (JUVEN)  1 packet Per Tube BID BM  . pneumococcal 23 valent vaccine  0.5 mL Intramuscular Tomorrow-1000   Continuous Infusions: . sodium chloride Stopped (05/01/19 0705)  . feeding supplement (GLUCERNA 1.5 CAL) 1,000 mL (05/06/19 1624)     LOS: 9 days    Time spent: 45 to 50 minutes    Pennie Banter, DO [facility]   If 7PM-7AM, please contact night-coverage [email or web address] 05/06/2019, 5:43 PM

## 2019-05-06 NOTE — Progress Notes (Signed)
Nutrition Follow-up  DOCUMENTATION CODES:   Morbid obesity  INTERVENTION:  Continue Glucerna 1.5 Cal at 80 mL/hr. Provides 2880 kcal, 158 grams of protein, 1459 mL H2O daily.  Continue free water flush of 100 mL Q4hrs. Provides 2059 mL H2O daily. Patient will also receive free water flush before and after medications.  Provide Juven Fruit Punch BID per tube, each serving provides 95kcal and 2.5g of protein, amino acids glutamine and arginine, and micronutrients essential for wound healing.  NUTRITION DIAGNOSIS:   Inadequate oral intake related to inability to eat as evidenced by (NPO status, reliance on tube feeds via G-tube to meet calorie/protein/hydration needs.).  Ongoing - addressing with TF regimen.  GOAL:   Patient will meet greater than or equal to 90% of their needs  Met with TF regimen.  MONITOR:   Labs, Weight trends, TF tolerance, Skin, I & O's  REASON FOR ASSESSMENT:   Consult Enteral/tube feeding initiation and management  ASSESSMENT:   23 year old male with PMHx of DM, HTN, who was admitted to [facility] on 01/20/2019 with ARDS in setting of COVID-19 requiring ECMO complicated by ischemic stroke and SDH with slight midline shift now with functional quadriplegia s/p tracheostomy tube placement on 02/20/2019 and G-tube placement on 02/21/2019 admitted with sepsis likely due to sacral wound.  Patient remains nonverbal. Discussed with RN. Patient continues to tolerate home TF regimen. Now that patient is more stable will add Juven to help promote wound healing. Pending discharge plan.  Medications reviewed and include: famotidine, free water flush 100 mL Q4hrs, Novolog 0-20 units Q4hrs, Lantus 15 units BID, Keppra, magnesium oxide 800 mg daily.  Labs reviewed: CBG 223-265, Chloride 95, Creatinine 0.33.  Enteral Access: G-tube present on admission  TF regimen: Glucerna 1.5 Cal at 80 mL/hr  Diet Order:   Diet Order            Diet NPO time specified  Diet  effective now             EDUCATION NEEDS:   No education needs have been identified at this time  Skin:  Skin Assessment: Skin Integrity Issues: Skin Integrity Issues:: Stage IV, Stage II, Stage I Stage I: right ischial tuberosity Stage II: left ischial tuberosity Stage IV: sacrum (9cm x 6cm x6cm) with wound VAC  Last BM:  05/06/2019 - medium type 7 per rectal tube  Height:   Ht Readings from Last 1 Encounters:  04/29/19 5' 10" (1.778 m)   Weight:   Wt Readings from Last 1 Encounters:  05/04/19 (!) 209.1 kg   Ideal Body Weight:  65.4 kg(adjusted for functional quadriplegia)  BMI:  Body mass index is 66.15 kg/m.  Estimated Nutritional Needs:   Kcal:  2800-3000  Protein:  >145 grams  Fluid:  2.1-2.5 L/day  Leanne King, MS, RD, LDN Office: [phone] Pager: [phone] After Hours/Weekend Pager: [phone] 

## 2019-05-07 LAB — BASIC METABOLIC PANEL WITH GFR
Anion gap: 11 (ref 5–15)
BUN: 11 mg/dL (ref 6–20)
CO2: 28 mmol/L (ref 22–32)
Calcium: 9.5 mg/dL (ref 8.9–10.3)
Chloride: 97 mmol/L — ABNORMAL LOW (ref 98–111)
Creatinine, Ser: 0.37 mg/dL — ABNORMAL LOW (ref 0.61–1.24)
GFR calc Af Amer: >60 mL/min
GFR calc non Af Amer: >60 mL/min
Glucose, Bld: 249 mg/dL — ABNORMAL HIGH (ref 70–99)
Potassium: 4.6 mmol/L (ref 3.5–5.1)
Sodium: 136 mmol/L (ref 135–145)

## 2019-05-07 LAB — GLUCOSE, CAPILLARY
Glucose-Capillary: 182 mg/dL — ABNORMAL HIGH (ref 70–99)
Glucose-Capillary: 231 mg/dL — ABNORMAL HIGH (ref 70–99)
Glucose-Capillary: 232 mg/dL — ABNORMAL HIGH (ref 70–99)
Glucose-Capillary: 232 mg/dL — ABNORMAL HIGH (ref 70–99)
Glucose-Capillary: 257 mg/dL — ABNORMAL HIGH (ref 70–99)
Glucose-Capillary: 259 mg/dL — ABNORMAL HIGH (ref 70–99)
Glucose-Capillary: 264 mg/dL — ABNORMAL HIGH (ref 70–99)

## 2019-05-07 MED ORDER — INSULIN GLARGINE 100 UNIT/ML ~~LOC~~ SOLN
40.0000 [IU] | Freq: Two times a day (BID) | SUBCUTANEOUS | Status: DC
  Administered 2019-05-07 – 2019-05-11 (×8): 40 [IU] via SUBCUTANEOUS
  Filled 2019-05-07 (×9): qty 0.4

## 2019-05-07 NOTE — TOC Progression Note (Addendum)
Transition of Care [facility]) - Progression Note    Patient Details  Name: Paul Hall MRN: [id number] Date of Birth: Jan 13, 1996  Transition of [facility]) CM/SW Contact  [name], LCSW Phone Number: 05/07/2019, 2:25 PM  Clinical Narrative: Sent wound vac orders to patient's wife's cousin that will be providing wound care services. CSW working on ordering foley supplies for home use through [employer]. CSW called and set up an account. RN will check foley size so order can be started.  3:57 pm: [employer] rep found out they would be able to provide foley catheter supplies. They just need a detailed order including size and reference number on packaging. RN unable to see what size it is so he asked wife to check his discharge paperwork from [facility] to see if it is in there. [employer] also needs specifics on why he needs it in MD's progress note. MD is aware. Received call from mom. Patient's trach size was changed today so they will need new trach supplies, cleaning kits, etc. [employer] will see if they were already providing that prior to admission but said the hospital should send him home with the smaller size trach. Discussed conversations about trach being removed prior to discharge. She understands that pulmonology is concerned about his sleep apnea. Patient has been on a cpap for years through [employer] but he cannot use it right now unless the trach is capped.  Expected Discharge Plan: Home/Self Care Barriers to Discharge: Barriers Resolved  Expected Discharge Plan and Services Expected Discharge Plan: Home/Self Care   Discharge Planning Services: CM Consult   Living arrangements for the past 2 months: Single Family Home, Skilled Nursing Facility                         Representative spoke with at DME Agency: Spoke with [name] regarding equipment being picked up, he reports that he will take care of it         Representative spoke with at [facility] Agency: [name], [facility]   Social  Determinants of Health (SDOH) Interventions    Readmission Risk Interventions No flowsheet data found.

## 2019-05-07 NOTE — Progress Notes (Addendum)
Physical Therapy Treatment Patient Details Name: Paul Hall MRN: [id number] DOB: [birth year] Today's Date: 05/07/2019    History of Present Illness Paul Hall is a 57yoM who comes to [facility] on 1/23 from home at recommendation of PCP for closer assessment of scaral wound. respiratory failure, failure to wean from ventilator requiring tracheostomy, insulin-dependent diabetes mellitus, shock requiring vasopressors, hypertension, Serratia urinary tract infection, stroke leading to quadriplegia, seizures, subdural hematoma, bilateral lower extremity DVT status post IVC filter placement 02/16/2019, dysphagia status post PEG tube placement, urinary retention with chronic indwelling Foley catheter, TFTDD-22 infection complicated by ARDS requiring ECMO. [facility] in [place] on 01/20/2019 and discharged to acute rehab on 03/15/2019. Pt noted to be in sepsis d/t sacral wound infection. Spouse states patient utilizes passey-muir at home without noted respiratory distress, also reports PMH OSA. Surgical consult 1/24 recommending conservative management of woudns at this time. ENT consult 'Patient is not a candidate for decannulation at this time.' Pt has been home for about 2 weeks PTA, extensive DME in home, but no therapy services due to insurance barriers as pt still trying to apply for medicaid.    PT Comments    Pt in bari bed upon entry,  30 degrees Rt sidelying (mattress mediated), HOB appears close to 10 degrees, pt asleep, HR 120s bpm as per prior visits. Pt appears to be breathing well in mostly flat position. Wife declines follow p questions regarding education provided on UE P/ROM last week. Wife agreeable to instruction regarding P/ROM of BLE. Reviewed Heel slides, hip abduction/adduction, ankle PF/DF. Pt does not rouse, no change in facial expression throughout. Wife able to perform interventions as instructed. Wife given time to ask questions, additional eduction provided as needed.  Will defer greater detail on turn schedule and wound management education to nursing team.         Follow Up Recommendations  Home health PT;Supervision/Assistance - 24 hour     Equipment Recommendations  None recommended by PT    Recommendations for Other Services       Precautions / Restrictions Precautions Precaution Comments: multiple wounds, tube feeder, tracheostomy collar on 5L for humidity Restrictions Weight Bearing Restrictions: No    Mobility  Bed Mobility               General bed mobility comments: none performed this session, pt is being managed on a schedule for pressure relief  Transfers                    Ambulation/Gait                 Stairs             Wheelchair Mobility    Modified Rankin (Stroke Patients Only)       Balance                                            Cognition Arousal/Alertness: Lethargic   Overall Cognitive Status: History of cognitive impairments - at baseline Area of Impairment: Rancho level               Rancho Levels of Cognitive Functioning Rancho Los Amigos Scales of Cognitive Functioning: Generalized response               General Comments: No clear indication that patient is able to effectively communicate in a meaningful  way, family has been reliant on grimacing and other body language, as well as grunting for communication needs. Heavy cognitive impairment since CVA in Nov 2020.      Exercises General Exercises - Lower Extremity Ankle Circles/Pumps: PROM;Both;Supine;20 reps Heel Slides: PROM;Supine;20 reps;Both Hip ABduction/ADduction: PROM;Both;20 reps;Supine Other Exercises Other Exercises: *Hip rotation ROM deferred d/t sacral wound placement, hip flexion kept below 90 degrees to avoid compromise of sacral wound vac seal    General Comments        Pertinent Vitals/Pain Pain Assessment: Faces Faces Pain Scale: Hurts a little bit Pain  Location: consistent mild grimace throughout, similar to prior visits, no change with intervention. Pain Intervention(s): Monitored during session;Limited activity within patient's tolerance    Home Living                      Prior Function            PT Goals (current goals can now be found in the care plan section) Acute Rehab PT Goals Patient Stated Goal: improve ability to care for patient at home PT Goal Formulation: With family Time For Goal Achievement: 05/16/19 Potential to Achieve Goals: Fair Progress towards PT goals: Progressing toward goals    Frequency    Min 2X/week      PT Plan Current plan remains appropriate    Co-evaluation              AM-PAC PT "6 Clicks" Mobility   Outcome Measure  Help needed turning from your back to your side while in a flat bed without using bedrails?: Total Help needed moving from lying on your back to sitting on the side of a flat bed without using bedrails?: Total Help needed moving to and from a bed to a chair (including a wheelchair)?: Total Help needed standing up from a chair using your arms (e.g., wheelchair or bedside chair)?: Total Help needed to walk in hospital room?: Total Help needed climbing 3-5 steps with a railing? : Total 6 Click Score: 6    End of Session Equipment Utilized During Treatment: Oxygen(5L via trach collar for humidity per RT) Activity Tolerance: Patient tolerated treatment well;Patient limited by lethargy Patient left: in bed;with family/visitor present;with call bell/phone within reach Nurse Communication: Mobility status PT Visit Diagnosis: Muscle weakness (generalized) (M62.81);Other symptoms and signs involving the nervous system (R29.898);Apraxia (R48.2);Hemiplegia and hemiparesis Hemiplegia - caused by: Other Nontraumatic intracranial hemorrhage;Cerebral infarction     Time: 0962-8366 PT Time Calculation (min) (ACUTE ONLY): 30 min  Charges:  $Self Care/Home Management:  23-37                     1:55 PM, 05/07/19 Rosamaria Lints, PT, DPT Physical Therapist - [facility]  [phone] (ASCOM)     Kallee Nam C 05/07/2019, 1:51 PM

## 2019-05-07 NOTE — Progress Notes (Signed)
CH encountered pt.'s [name]) as she was leaving the ICU and after seeing her @ bedside previously.  Wife shared pt. will be discharged this weekend; said she has been the reason he's been held here so long --she wants to make sure 'he won't have to come back'.  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 also enjoyed working w/kids and that it was through contact w/a Covid-infected child that pt. originally was infected w/Covid.  Wife did not have any immediate needs at the time; shared she appreciated past chaplain visits; her outlook upon her husband's condition and prognosis is hopeful.  CH will follow up as possible in the coming days.      05/07/19 1630  Clinical Encounter Type  Visited With Family  Visit Type Initial;Follow-up  Referral From Chaplain  Spiritual Encounters  Spiritual Needs Emotional  Stress Factors  Family Stress Factors Loss of control;Major life changes;Health changes

## 2019-05-07 NOTE — Progress Notes (Signed)
Assisted tele visit to patient with mother.  Lindsi Bayliss M, RN  

## 2019-05-07 NOTE — Progress Notes (Signed)
Patient's mom, [name], called and stated that care team had called updated her about plan for discharge on Saturday. Since patient is going to her house, patient's mom would like to be present for discharge teaching. While this RN felt that would be appropriate, advised mother to call back on Friday to check with care team.

## 2019-05-07 NOTE — Progress Notes (Addendum)
PROGRESS NOTE    Paul Hall  LHT:[id number] DOB: 1995-04-17 DOA: 04/27/2019  PCP: Shawnie Dapper, PA-C    LOS - 10   Brief Narrative:  Admitted with sepsis secondary to sacral wound infection.  Summary of Pertinent history per chart review:  Paul Hall Withersis an 24 y.o.malewith medical history significant for respiratory failure, failure to wean from ventilator requiring tracheostomy, insulin-dependent diabetes mellitus, shock requiring vasopressors, hypertension, Serratia urinary tract infection, stroke leading to quadriplegia, seizures, subdural hematoma, bilateral lower extremity DVT status post IVC filter placement 02/16/2019, dysphagia status post PEG tube placement, urinary retention with chronic indwelling Foley catheter, COVID-19 infection complicated by ARDS requiring ECMOand stroke. He was admitted to [facility] in [facility] 10/18/2020and discharged to acute rehab on 03/15/2019. According to his wife at the bedside, patient was discharged from the rehab facility a few weeks ago.  Patient is nonverbal so history was obtained from chart review andhis wife at the bedside. Wife reports PCP recommended ED visit for evaluation of sacral decubitus ulcer with in increasing drainage and foul odor.  Ulcer apparently developed while on ECMO and unable to be turned.  He can move his extremities at times.    Subjective 2/2: Patient seen this morning, no family present.  He would open his eyes a little more while I was talking to him, telling him of the plans to get him home soon.  No interaction otherwise.  No further reports of extremity movements (day before had his left arm lifted up and across his chest).  Wife present later and updated.  Discussed final discharge planning underway.  We discussed his trach, which their PCP has indicated he may not need.  Per PCCM, given body habitus and underlying OSA, it should stay for now until functional status improves.    Assessment & Plan:   Principal Problem:   Sepsis (HCC) Active Problems:   H/O insulin dependent diabetes mellitus   History of stroke   Seizure disorder (HCC)   Decubitus ulcer of sacral region, unstageable (HCC)   Goals of care, counseling/discussion   Palliative care encounter  Sepsis due to unstageable sacral woundinfection/leukocytosis:  Leukocytosis hasresolved.  General surgery consulted, no surgery recommended, wound vac placed. Surgery signed off. No evidence of osteomyelitis on CT pelvisand blood cultures NGTD.Patient remains tachycardic but this is baseline per his wife (usually HR 110's to 120), and tachypneic at times. Otherwise sepsis resolved.   --Wound improving (see prog note of 2/1 for image). --completed Vanc/Zosyn --monitor clinically for s/sx's of infection  Positive Fluid Balance-improved.  Was net positive over 4 L, improved with 2 days IV Lasix.  Lower extremity edema improved.  Hold further diuresis and reassess.   --PRN Lasix  Hypotension:Resolved.Normotensive without medications. --Hold amlodipine, chlorthalidone and Coreg  Hypokalemia:replaced --monitor BMP daily and replace as needed  Insulin-dependenttype IIdiabetes mellitus: Uncontrolled on resistant sliding scale alone.  Uptitrating up basal insulin. --Increase Lantus to 40 units BID --continue resistant sliding scale Novolog q4h --monitor CBG's --adjust insulin as needed for goal 140-180  History of stroke with quadriplegia(complication of Covid-19infection): Patient is nonverbal but sometimes he can move his extremities from side to side. --Continue aspirin --continue Keppra for seizure prophylaxis --Neurology reducedKeppra dose from 1500 to 1000 mg BID --Neurology's assessment is that patient's mental status is likely not due to Keppra alone.  ?History ofSeizure disorder: Keppra for seizure prevention in setting of stroke(s), per wife.    Dysphagia:  Continue enteral nutrition.   Urinary retentionwith abnormal urinalysis:  Chronic indwelling Foley catheter in place.  --Abnormal urinalysis is probably due to indwelling Foley catheter.  --No leukocytosis or signs of developing infection now off antibiotics --monitor clinically for s/sx's of infection --Continue indwelling Foley  Chronic hypoxemic respiratory failure s/p tracheostomy:  He is onFiO2 28% and5 L/min oxygen via trach collar. Minimal of 28% FiO2 is required for humidity through trach collarper RT. His wife said he has intermittent oxygen desaturation especially when he is sleeping and they were told in the past that this was from obstructive sleep apnea.   --recommend sleep study in the future and treating OSA --trach downsized a few days ago --family need new supplies for new trach size  Chronic Indwelling Foley - required given quadriplegia, healing stage IV sacral wound and incontinence.  Should be replaced before discharge. --maintain foley  History of bilateral lower extremity DVT status post IVC filter placement:  Continue Lovenox for DVT prophylaxis  Hyponatremia: Improved.  Morbid Obesity - BMI 65.7   DVT prophylaxis:Lovenox Code Status: Full Code Family Communication:wife at bedside  Disposition Plan: Final discharge planning underway.  Wife's cousin will be his home home nurse for wound care, Foley, etc.  TOC working on securing supplies needed for them.  Medically stable to discharge.   Consultants:  General surgery - signed off   Antimicrobials:  Cefepime 1/23 >>1/29  Vancomycin 1/23 >>1/29  Flagyl once 1/23   Objective: Vitals:   05/07/19 0400 05/07/19 0500 05/07/19 0600 05/07/19 0700  BP: (!) 136/104  134/88 (!) 146/98  Pulse: (!) 120 (!) 113 (!) 120 (!) 115  Resp:      Temp:  98.2 F (36.8 C)    TempSrc:  Oral    SpO2: 98% 100% 100% 100%  Weight:  (!) 204 kg    Height:        Intake/Output Summary  (Last 24 hours) at 05/07/2019 0815 Last data filed at 05/07/2019 0600 Gross per 24 hour  Intake 1040 ml  Output 780 ml  Net 260 ml   Filed Weights   05/03/19 0419 05/04/19 0500 05/07/19 0500  Weight: (!) 209.6 kg (!) 209.1 kg (!) 204 kg    Examination:  General exam: awake, alert, no acute distress, morbidly obese HEENT: opens eyes more today, hearing grossly normal, left ear with small area of erythema/irritation Respiratory system: clear to auscultation anteriorly, trach present without visible secretions, normal respiratory effort.   Cardiovascular system: normal S1/S2, RRR, distant heart sounds due to body habitus Gastrointestinal system: soft, non-tender, non-distended abdomen, normal bowel sounds.  Rectal tube in place.  Wound VAC in place. Central nervous system: Unable to evaluate due to patient nonverbal and not following commands.  Does appear to be listening to people around him talking more and opening eyes more. Extremities: moves none, offloading boots bilaterally, trace-1+ edema Skin: dry, intact, normal temperature  Data Reviewed: I have personally reviewed following labs and imaging studies  CBC: Recent Labs  Lab 05/01/19 0624 05/04/19 0628 05/05/19 0611  WBC 9.3 9.4 7.9  NEUTROABS 5.8 5.2 4.8  HGB 10.3* 9.9* 11.7*  HCT 34.6* 32.2* 38.3*  MCV 86.7 85.2 85.1  PLT 369 394 725*   Basic Metabolic Panel: Recent Labs  Lab 05/01/19 0528 05/01/19 0528 05/02/19 0419 05/04/19 0628 05/05/19 0611 05/06/19 0443 05/07/19 0510  NA 138  --   --  139 135 136 136  K 3.9  --   --  4.2 4.4 4.8 4.6  CL 101  --   --  99 95* 95* 97*  CO2 27  --   --  28 28 32 28  GLUCOSE 172*  --   --  195* 217* 260* 249*  BUN 9  --   --  8 10 11 11   CREATININE 0.32*   < > 0.35* <0.30* 0.32* 0.33* 0.37*  CALCIUM 9.2  --   --  9.6 9.7 9.6 9.5  MG  --   --   --  1.8 1.8  --   --   PHOS  --   --   --  3.7  --   --   --    < > = values in this interval not displayed.   GFR: Estimated  Creatinine Clearance: 254.7 mL/min (A) (by C-G formula based on SCr of 0.37 mg/dL (L)). Liver Function Tests: Recent Labs  Lab 05/04/19 0628 05/06/19 0443  AST 43* 38  ALT 100* 102*  ALKPHOS 57 59  BILITOT 0.5 0.4  PROT 7.7 7.8  ALBUMIN 2.4* 2.3*   No results for input(s): LIPASE, AMYLASE in the last 168 hours. No results for input(s): AMMONIA in the last 168 hours. Coagulation Profile: No results for input(s): INR, PROTIME in the last 168 hours. Cardiac Enzymes: No results for input(s): CKTOTAL, CKMB, CKMBINDEX, TROPONINI in the last 168 hours. BNP (last 3 results) No results for input(s): PROBNP in the last 8760 hours. HbA1C: No results for input(s): HGBA1C in the last 72 hours. CBG: Recent Labs  Lab 05/06/19 1617 05/06/19 2015 05/06/19 2340 05/07/19 0411 05/07/19 0741  GLUCAP 243* 267* 264* 257* 232*   Lipid Profile: No results for input(s): CHOL, HDL, LDLCALC, TRIG, CHOLHDL, LDLDIRECT in the last 72 hours. Thyroid Function Tests: No results for input(s): TSH, T4TOTAL, FREET4, T3FREE, THYROIDAB in the last 72 hours. Anemia Panel: No results for input(s): VITAMINB12, FOLATE, FERRITIN, TIBC, IRON, RETICCTPCT in the last 72 hours. Sepsis Labs: No results for input(s): PROCALCITON, LATICACIDVEN in the last 168 hours.  Recent Results (from the past 240 hour(s))  Blood Culture (routine x 2)     Status: None   Collection Time: 04/27/19 12:43 PM   Specimen: BLOOD  Result Value Ref Range Status   Specimen Description BLOOD LEFT ANTECUBITAL  Final   Special Requests   Final    BOTTLES DRAWN AEROBIC AND ANAEROBIC Blood Culture results may not be optimal due to an inadequate volume of blood received in culture bottles   Culture   Final    NO GROWTH 5 DAYS Performed at [facility], [address] [place], [place]    Report Status 05/02/2019 FINAL  Final  Blood Culture (routine x 2)     Status: Abnormal   Collection Time: 04/27/19 12:43 PM    Specimen: BLOOD  Result Value Ref Range Status   Specimen Description   Final    BLOOD BLOOD RIGHT FOREARM Performed at [facility], [address] [place], [place]    Special Requests   Final    BOTTLES DRAWN AEROBIC AND ANAEROBIC Blood Culture adequate volume Performed at [facility], [address] [place], [place]    Culture  Setup Time   Final    AEROBIC BOTTLE ONLY GRAM POSITIVE RODS CRITICAL RESULT CALLED TO, READ BACK BY AND VERIFIED WITH: SCOTT HALL ON 04/28/19 AT [address] [facility] Performed at [facility], [address], [place], [place]    Culture (A)  Final    DIPHTHEROIDS(CORYNEBACTERIUM SPECIES) Standardized  susceptibility testing for this organism is not available. Performed at [facility], [address] [place], [place] [address]    Report Status 05/01/2019 FINAL  Final  Urine culture     Status: Abnormal   Collection Time: 04/27/19 12:43 PM   Specimen: In/Out Cath Urine  Result Value Ref Range Status   Specimen Description   Final    IN/OUT CATH URINE Performed at [facility], [address] [place], [place] [address]    Special Requests   Final    NONE Performed at [facility], [address] [place], [place] [address]    Culture MULTIPLE SPECIES PRESENT, SUGGEST RECOLLECTION (A)  Final   Report Status 04/29/2019 FINAL  Final  Respiratory Panel by RT PCR (Flu A&B, Covid) -     Status: None   Collection Time: 04/27/19 12:43 PM  Result Value Ref Range Status   SARS Coronavirus 2 by RT PCR NEGATIVE NEGATIVE Final    Comment: (NOTE) SARS-CoV-2 target nucleic acids are NOT DETECTED. The SARS-CoV-2 RNA is generally detectable in upper respiratoy specimens during the acute phase of infection. The lowest concentration of SARS-CoV-2 viral copies this assay can detect is 131 copies/mL. A negative result does not preclude SARS-Cov-2 infection and should not be used as the sole  basis for treatment or other patient management decisions. A negative result may occur with  improper specimen collection/handling, submission of specimen other than nasopharyngeal swab, presence of viral mutation(s) within the areas targeted by this assay, and inadequate number of viral copies (<131 copies/mL). A negative result must be combined with clinical observations, patient history, and epidemiological information. The expected result is Negative. Fact Sheet for Patients:  https://www.moore.com/ Fact Sheet for Healthcare Providers:  https://www.young.biz/ This test is not yet ap proved or cleared by the [place] FDA and  has been authorized for detection and/or diagnosis of SARS-CoV-2 by FDA under an Emergency Use Authorization (EUA). This EUA will remain  in effect (meaning this test can be used) for the duration of the COVID-19 declaration under Section 564(b)(1) of the Act, 21 U.S.C. section 360bbb-3(b)(1), unless the authorization is terminated or revoked sooner.    Influenza A by PCR NEGATIVE NEGATIVE Final   Influenza B by PCR NEGATIVE NEGATIVE Final    Comment: (NOTE) The Xpert Xpress SARS-CoV-2/FLU/RSV assay is intended as an aid in  the diagnosis of influenza from Nasopharyngeal swab specimens and  should not be used as a sole basis for treatment. Nasal washings and  aspirates are unacceptable for Xpert Xpress SARS-CoV-2/FLU/RSV  testing. Fact Sheet for Patients: https://www.moore.com/ Fact Sheet for Healthcare Providers: https://www.young.biz/ This test is not yet approved or cleared by the [place] FDA and  has been authorized for detection and/or diagnosis of SARS-CoV-2 by  FDA under an Emergency Use Authorization (EUA). This EUA will remain  in effect (meaning this test can be used) for the duration of the  Covid-19 declaration under Section 564(b)(1) of the Act, 21  U.S.C.  section 360bbb-3(b)(1), unless the authorization is  terminated or revoked. Performed at [facility], [address] [place], [place] [address]   C difficile quick scan w PCR reflex     Status: None   Collection Time: 04/27/19  1:10 PM   Specimen: Stool  Result Value Ref Range Status   C Diff antigen NEGATIVE NEGATIVE Final   C Diff toxin NEGATIVE NEGATIVE Final   C Diff interpretation No C. difficile detected.  Final  Comment: Performed at [facility], [address] [place], [place] [address]  MRSA PCR Screening     Status: None   Collection Time: 04/29/19  3:47 PM   Specimen: Nasopharyngeal  Result Value Ref Range Status   MRSA by PCR NEGATIVE NEGATIVE Final    Comment:        The GeneXpert MRSA Assay (FDA approved for NASAL specimens only), is one component of a comprehensive MRSA colonization surveillance program. It is not intended to diagnose MRSA infection nor to guide or monitor treatment for MRSA infections. Performed at [facility], [address] [place], [place] [address]          Radiology Studies: No results found.      Scheduled Meds: . aspirin EC  81 mg Oral Daily  . Chlorhexidine Gluconate Cloth  6 each Topical Daily  . enoxaparin (LOVENOX) injection  40 mg Subcutaneous Q12H  . famotidine  20 mg Per Tube BID  . free water  100 mL Per Tube Q4H  . insulin aspart  0-20 Units Subcutaneous Q4H  . insulin glargine  40 Units Subcutaneous BID  . levETIRAcetam  1,000 mg Per Tube BID  . magnesium oxide  800 mg Per Tube Daily  . mouth rinse  15 mL Mouth Rinse q12n4p  . nutrition supplement (JUVEN)  1 packet Per Tube BID BM  . pneumococcal 23 valent vaccine  0.5 mL Intramuscular Tomorrow-1000   Continuous Infusions: . sodium chloride Stopped (05/01/19 0705)  . feeding supplement (GLUCERNA 1.5 CAL) 80 mL/hr at 05/07/19 0600     LOS: 10 days    Time spent: 45 minutes    Pennie BanterKelly A Yeng Perz, DO [facility]   If 7PM-7AM, please contact night-coverage [email or web address] 05/07/2019, 8:15 AM

## 2019-05-08 DIAGNOSIS — R000 Tachycardia, unspecified: Secondary | ICD-10-CM

## 2019-05-08 DIAGNOSIS — R600 Localized edema: Secondary | ICD-10-CM

## 2019-05-08 DIAGNOSIS — I6930 Unspecified sequelae of cerebral infarction: Secondary | ICD-10-CM

## 2019-05-08 DIAGNOSIS — G9341 Metabolic encephalopathy: Secondary | ICD-10-CM

## 2019-05-08 DIAGNOSIS — Z6841 Body Mass Index (BMI) 40.0 and over, adult: Secondary | ICD-10-CM

## 2019-05-08 DIAGNOSIS — G4733 Obstructive sleep apnea (adult) (pediatric): Secondary | ICD-10-CM

## 2019-05-08 LAB — CBC WITH DIFFERENTIAL/PLATELET
Abs Immature Granulocytes: 0.04 10*3/uL (ref 0.00–0.07)
Basophils Absolute: 0 10*3/uL (ref 0.0–0.1)
Basophils Relative: 0 %
Eosinophils Absolute: 0.2 10*3/uL (ref 0.0–0.5)
Eosinophils Relative: 2 %
HCT: 34.5 % — ABNORMAL LOW (ref 39.0–52.0)
Hemoglobin: 10.6 g/dL — ABNORMAL LOW (ref 13.0–17.0)
Immature Granulocytes: 0 %
Lymphocytes Relative: 28 %
Lymphs Abs: 3.2 10*3/uL (ref 0.7–4.0)
MCH: 26.2 pg (ref 26.0–34.0)
MCHC: 30.7 g/dL (ref 30.0–36.0)
MCV: 85.4 fL (ref 80.0–100.0)
Monocytes Absolute: 0.9 10*3/uL (ref 0.1–1.0)
Monocytes Relative: 8 %
Neutro Abs: 6.9 10*3/uL (ref 1.7–7.7)
Neutrophils Relative %: 62 %
Platelets: 398 10*3/uL (ref 150–400)
RBC: 4.04 MIL/uL — ABNORMAL LOW (ref 4.22–5.81)
RDW: 14.1 % (ref 11.5–15.5)
WBC: 11.3 10*3/uL — ABNORMAL HIGH (ref 4.0–10.5)
nRBC: 0 % (ref 0.0–0.2)

## 2019-05-08 LAB — GLUCOSE, CAPILLARY
Glucose-Capillary: 169 mg/dL — ABNORMAL HIGH (ref 70–99)
Glucose-Capillary: 144 mg/dL — ABNORMAL HIGH (ref 70–99)
Glucose-Capillary: 159 mg/dL — ABNORMAL HIGH (ref 70–99)
Glucose-Capillary: 211 mg/dL — ABNORMAL HIGH (ref 70–99)
Glucose-Capillary: 233 mg/dL — ABNORMAL HIGH (ref 70–99)

## 2019-05-08 LAB — BASIC METABOLIC PANEL
Anion gap: 10 (ref 5–15)
BUN: 12 mg/dL (ref 6–20)
CO2: 30 mmol/L (ref 22–32)
Calcium: 9.8 mg/dL (ref 8.9–10.3)
Chloride: 100 mmol/L (ref 98–111)
Creatinine, Ser: 0.34 mg/dL — ABNORMAL LOW (ref 0.61–1.24)
GFR calc Af Amer: >60 mL/min (ref >60)
GFR calc non Af Amer: >60 mL/min (ref >60)
Glucose, Bld: 162 mg/dL — ABNORMAL HIGH (ref 70–99)
Potassium: 4.2 mmol/L (ref 3.5–5.1)
Sodium: 140 mmol/L (ref 135–145)

## 2019-05-08 MED ORDER — FUROSEMIDE 20 MG PO TABS
20.0000 mg | ORAL_TABLET | Freq: Every day | ORAL | Status: DC
  Administered 2019-05-10 – 2019-05-11 (×2): 20 mg
  Filled 2019-05-08 (×3): qty 1

## 2019-05-08 MED ORDER — METOPROLOL TARTRATE 25 MG PO TABS
12.5000 mg | ORAL_TABLET | Freq: Two times a day (BID) | ORAL | Status: DC
  Administered 2019-05-08 – 2019-05-09 (×2): 12.5 mg
  Filled 2019-05-08 (×2): qty 1

## 2019-05-08 NOTE — Progress Notes (Signed)
Humidifier water bottled changed.

## 2019-05-08 NOTE — Plan of Care (Signed)
Metoprolol added to treat tachycardia. Foley needs to be changed and french size given to Child psychotherapist to order supplies.   Problem: Education: Goal: Knowledge of General Education information will improve Description: Including pain rating scale, medication(s)/side effects and non-pharmacologic comfort measures Outcome: Progressing   Problem: Elimination: Goal: Will not experience complications related to bowel motility Outcome: Progressing   Problem: Pain Managment: Goal: General experience of comfort will improve Outcome: Progressing   Problem: Safety: Goal: Ability to remain free from injury will improve Outcome: Progressing   Problem: Skin Integrity: Goal: Risk for impaired skin integrity will decrease Outcome: Progressing   Problem: Fluid Volume: Goal: Hemodynamic stability will improve Outcome: Progressing   Problem: Clinical Measurements: Goal: Diagnostic test results will improve Outcome: Progressing Goal: Signs and symptoms of infection will decrease Outcome: Progressing   Problem: Respiratory: Goal: Ability to maintain adequate ventilation will improve Outcome: Progressing

## 2019-05-08 NOTE — TOC Progression Note (Signed)
Transition of Care [facility]) - Progression Note    Patient Details  Name: Paul Hall MRN: [id number] Date of Birth: [birth year]  [facility]) CM/SW Contact  [name], LCSW Phone Number: 05/08/2019, 11:28 AM  Clinical Narrative: CSW sent message to RN and MD regarding ordering bariatric air mattress, trach supplies, and changing out his foley possibly today so we will know what supplies we need to order. Wife is aware.    Expected Discharge Plan: Home/Self Care Barriers to Discharge: Barriers Resolved  Expected Discharge Plan and Services Expected Discharge Plan: Home/Self Care   Discharge Planning Services: CM Consult   Living arrangements for the past 2 months: Single Family Home, Skilled Nursing Facility                         Representative spoke with at DME Agency: Spoke with [name] regarding equipment being picked up, he reports that he will take care of it         Representative spoke with at [facility] Agency: [name], [facility]   Social Determinants of Health (SDOH) Interventions    Readmission Risk Interventions No flowsheet data found.

## 2019-05-08 NOTE — Progress Notes (Signed)
   05/08/19 1600  Clinical Encounter Type  Visited With Patient;Patient and family together  Visit Type Follow-up  Referral From Chaplain  Consult/Referral To Chaplain  While doing rounds Chaplain visited with patient and wife. Chaplain had a brief conversation with wife. Chaplain offered pastoral presence, empathy, and prayer.

## 2019-05-08 NOTE — Progress Notes (Signed)
Patient ID: Paul Hall, male   DOB: [birth year], 24 y.o.   MRN: [id number] [facility] PROGRESS NOTE  Ibrahem Volkman [id number]:[id number] DOB: 31-Aug-1995 DOA: 04/27/2019 PCP: Shawnie Dapper, PA-C  HPI/Subjective: Patient unresponsive to me to sternal rub.  As per nursing staff he was more alert earlier but does not follow commands.  Objective: Vitals:   05/08/19 1300 05/08/19 1400  BP: 121/84 (!) 111/93  Pulse: (!) 132 (!) 134  Resp:    Temp:    SpO2: 100% 100%    Intake/Output Summary (Last 24 hours) at 05/08/2019 1709 Last data filed at 05/08/2019 1600 Gross per 24 hour  Intake --  Output 850 ml  Net -850 ml   Filed Weights   05/04/19 0500 05/07/19 0500 05/08/19 0415  Weight: (!) 209.1 kg (!) 204 kg (!) 210.9 kg    ROS: Review of Systems  Unable to perform ROS: Acuity of condition   Exam: Physical Exam  HENT:  Nose: No mucosal edema.  Unable to look into mouth  Eyes: Pupils are equal, round, and reactive to light. Conjunctivae and lids are normal.  Neck: Carotid bruit is present.  Cardiovascular: S1 normal and S2 normal. Tachycardia present.  Respiratory: He has decreased breath sounds in the right lower field and the left lower field. He has no wheezes. He has no rhonchi. He has no rales.  GI: Soft. Bowel sounds are normal. There is no abdominal tenderness.  Musculoskeletal:     Right ankle: Swelling present.     Left ankle: Swelling present.  Neurological:  Unresponsive to sternal rub  Skin: Skin is warm. Nails show no clubbing.  As per nursing staff stage IV decubiti buttock.  Wound VAC currently on.  Chronic lower extremity discoloration  Psychiatric:  Unresponsive to sternal rub      Data Reviewed: Basic Metabolic Panel: Recent Labs  Lab 05/04/19 0628 05/05/19 0611 05/06/19 0443 05/07/19 0510 05/08/19 0632  NA 139 135 136 136 140  K 4.2 4.4 4.8 4.6 4.2  CL 99 95* 95* 97* 100  CO2 28 28 32 28 30  GLUCOSE 195* 217* 260* 249*  162*  BUN 8 10 11 11 12   CREATININE <0.30* 0.32* 0.33* 0.37* 0.34*  CALCIUM 9.6 9.7 9.6 9.5 9.8  MG 1.8 1.8  --   --   --   PHOS 3.7  --   --   --   --    Liver Function Tests: Recent Labs  Lab 05/04/19 0628 05/06/19 0443  AST 43* 38  ALT 100* 102*  ALKPHOS 57 59  BILITOT 0.5 0.4  PROT 7.7 7.8  ALBUMIN 2.4* 2.3*   CBC: Recent Labs  Lab 05/04/19 0628 05/05/19 0611 05/08/19 0632  WBC 9.4 7.9 11.3*  NEUTROABS 5.2 4.8 6.9  HGB 9.9* 11.7* 10.6*  HCT 32.2* 38.3* 34.5*  MCV 85.2 85.1 85.4  PLT 394 401* 398    CBG: Recent Labs  Lab 05/07/19 2352 05/08/19 0407 05/08/19 0740 05/08/19 1200 05/08/19 1544  GLUCAP 182* 169* 144* 159* 211*    Recent Results (from the past 240 hour(s))  MRSA PCR Screening     Status: None   Collection Time: 04/29/19  3:47 PM   Specimen: Nasopharyngeal  Result Value Ref Range Status   MRSA by PCR NEGATIVE NEGATIVE Final    Comment:        The GeneXpert MRSA Assay (FDA approved for NASAL specimens only), is one component of a comprehensive MRSA colonization surveillance program.  It is not intended to diagnose MRSA infection nor to guide or monitor treatment for MRSA infections. Performed at [facility], [address] [place], [place] [address]      Scheduled Meds: . aspirin EC  81 mg Oral Daily  . Chlorhexidine Gluconate Cloth  6 each Topical Daily  . enoxaparin (LOVENOX) injection  40 mg Subcutaneous Q12H  . famotidine  20 mg Per Tube BID  . free water  100 mL Per Tube Q4H  . insulin aspart  0-20 Units Subcutaneous Q4H  . insulin glargine  40 Units Subcutaneous BID  . levETIRAcetam  1,000 mg Per Tube BID  . magnesium oxide  800 mg Per Tube Daily  . mouth rinse  15 mL Mouth Rinse q12n4p  . nutrition supplement (JUVEN)  1 packet Per Tube BID BM  . pneumococcal 23 valent vaccine  0.5 mL Intramuscular Tomorrow-1000   Continuous Infusions: . sodium chloride Stopped (05/01/19 0705)  . feeding supplement (GLUCERNA  1.5 CAL) 80 mL/hr at 05/07/19 1400    Assessment/Plan:  1. Sepsis, present on admission due to stage IV sacral decubiti.  Patient seen by general surgery and completed vancomycin and Zosyn while here in the hospital.  Wound VAC ordered. 2. Tachycardia.  Start low-dose metoprolol.  If no improvement with heart rate may end up getting a CAT scan of the chest. 3. Lower extremity edema.  Restart low-dose Lasix orally tomorrow. 4. Type 2 diabetes mellitus on Lantus 40 units subcutaneous injection twice a day and NovoLog sliding scale. 5. History of ischemic with quadriplegia after COVID-19 infection.  Patient on aspirin.  Patient also on Keppra. 6. Acute metabolic encephalopathy.  Likely multifactorial with history of stroke and quadriplegia after COVID-19 infection. 7. Obstructive sleep apnea, status post tracheostomy.  Pulmonary recommends trach to go home with. 8. Urinary retention.  Foley catheter needed secondary to quadriplegia and healing of stage IV wound. 9. Chronic hypoxemic respiratory failure.  Patient status post tracheostomy.  Patient on oxygen supplementation 10. History of bilateral lower extremity DVT status post IVC filter placement 11. Morbid obesity with a BMI of 66.72.  Code Status:     Code Status Orders  (From admission, onward)         Start     Ordered   04/27/19 1533  Full code  Continuous     04/27/19 1532        Code Status History    This patient has a current code status but no historical code status.   Advance Care Planning Activity     Family Communication: Spoke with wife at the bedside and mother on the phone. Disposition Plan: Family planning on taking the patient home on Saturday.  Consultants:  General surgery  Time spent: 30 minutes in coordination of care with transitional care team and speaking with family.  Ashley  [facility] [employer]

## 2019-05-08 NOTE — Consult Note (Addendum)
WOC Nurse follow-up consult Note: Reason for Consult: Vac dressing changed with 3 person assistance.  It will be very challenging to maintain a seal for the Vac when at home; since wound is located close to the rectum and patient is incontinent of stool.  Flexiseal is in place to attempt to contain liquid stool, but still leaks around the insertion site and patient is frequently moist to buttocks skin surrounding the wound.  Wound type: Chronic stage 4 pressure injury to sacrum; 85% red, 15% yellow/brown tightly adhered slough to inner wound bed. Large amt brown drainage in previous cannister.  Pressure Injury POA: Yes Dressing procedure/placement/frequency: Applied one piece black foam to inner wound with a barrier ring around the edges to attempt to maintain a seal, and another applied to flank to bridge track pad and reduce pressure.  Cont suction applied at .  Pt tolerated without apparent discomfort.  Wife at bedside to assess wound appearance and discuss plan of care. Pt is on a Bariatric air mattress to reduce pressure. Seal was lost during turning session and more drape was reapplied and good seal was obtained again.   If the negative pressure wound therapy (Vac) is a barrier to discharge, then pt can use moist gauze packing to the wound, and this should be changed every day. This is often the only solution when patients are frequently incontinent of stool and a Vac seal cannot be maintained.  Medella dressings and machine are on the window seal for application upon discharge and use at home.  One hour spent performing this consult. WOC team will continue to change the dressing Q M/W/F while patient is in the hospital.  Cammie Mcgee MSN, RN, [place], [place], CNS [phone]

## 2019-05-09 ENCOUNTER — Inpatient Hospital Stay: Payer: BC Managed Care – PPO

## 2019-05-09 DIAGNOSIS — R339 Retention of urine, unspecified: Secondary | ICD-10-CM

## 2019-05-09 DIAGNOSIS — E119 Type 2 diabetes mellitus without complications: Secondary | ICD-10-CM

## 2019-05-09 DIAGNOSIS — Z794 Long term (current) use of insulin: Secondary | ICD-10-CM

## 2019-05-09 LAB — GLUCOSE, CAPILLARY
Glucose-Capillary: 182 mg/dL — ABNORMAL HIGH (ref 70–99)
Glucose-Capillary: 184 mg/dL — ABNORMAL HIGH (ref 70–99)
Glucose-Capillary: 191 mg/dL — ABNORMAL HIGH (ref 70–99)
Glucose-Capillary: 191 mg/dL — ABNORMAL HIGH (ref 70–99)
Glucose-Capillary: 194 mg/dL — ABNORMAL HIGH (ref 70–99)
Glucose-Capillary: 223 mg/dL — ABNORMAL HIGH (ref 70–99)
Glucose-Capillary: 244 mg/dL — ABNORMAL HIGH (ref 70–99)

## 2019-05-09 LAB — BASIC METABOLIC PANEL
Anion gap: 9 (ref 5–15)
BUN: 13 mg/dL (ref 6–20)
CO2: 29 mmol/L (ref 22–32)
Calcium: 9.5 mg/dL (ref 8.9–10.3)
Chloride: 99 mmol/L (ref 98–111)
Creatinine, Ser: 0.3 mg/dL — ABNORMAL LOW (ref 0.61–1.24)
GFR calc Af Amer: >60 mL/min (ref >60)
GFR calc non Af Amer: >60 mL/min (ref >60)
Glucose, Bld: 245 mg/dL — ABNORMAL HIGH (ref 70–99)
Potassium: 4.3 mmol/L (ref 3.5–5.1)
Sodium: 137 mmol/L (ref 135–145)

## 2019-05-09 MED ORDER — LOPERAMIDE HCL 2 MG PO CAPS
4.0000 mg | ORAL_CAPSULE | Freq: Three times a day (TID) | ORAL | Status: DC | PRN
  Filled 2019-05-09: qty 2

## 2019-05-09 MED ORDER — METOPROLOL TARTRATE 25 MG PO TABS
25.0000 mg | ORAL_TABLET | Freq: Two times a day (BID) | ORAL | Status: DC
  Administered 2019-05-09 – 2019-05-10 (×2): 25 mg
  Filled 2019-05-09 (×2): qty 1

## 2019-05-09 NOTE — Progress Notes (Signed)
Assisted tele visit to patient with family member.  Mayline Dragon McEachran, RN  

## 2019-05-09 NOTE — TOC Progression Note (Addendum)
Transition of Care [facility]) - Progression Note    Patient Details  Name: Ervie Mccard MRN: [id number] Date of Birth: [birth year]  Transition [facility] Care [facility]) CM/SW Contact  [name], LCSW Phone Number: 05/09/2019, 1:34 PM  Clinical Narrative: Sent foley catheter details to MD so they could be entered into the DME order. "Bardex foley catheter. 18 Fr. 5cc Ribbed Balloon. Reference # [id number]."   1:55 pm: Confirmed with [employer] representative that patient's tube feeds and free water are already taken care of.  Expected Discharge Plan: Home/Self Care Barriers to Discharge: Barriers Resolved  Expected Discharge Plan and Services Expected Discharge Plan: Home/Self Care   Discharge Planning Services: CM Consult   Living arrangements for the past 2 months: Single Family Home, Skilled Nursing Facility                         Representative spoke with at DME Agency: Spoke with [name] regarding equipment being picked up, he reports that he will take care of it         Representative spoke with at [facility] Agency: [name], [facility]   Social Determinants of Health (SDOH) Interventions    Readmission Risk Interventions No flowsheet data found.

## 2019-05-09 NOTE — Progress Notes (Signed)
Patient ID: Paul Hall, male   DOB: [birth year], 24 y.o.   MRN: [id number] [facility] PROGRESS NOTE  Yul Diana VVO:[id number] DOB: 02-24-1996 DOA: 04/27/2019 PCP: Cory Munch, PA-C  HPI/Subjective: Patient again unresponsive to sternal rub.  Patient's wife states that he moves around more for her.  As per nursing staff only opens eyes.  Objective: Vitals:   05/09/19 0700 05/09/19 0800  BP: 110/74 120/73  Pulse: (!) 104 (!) 118  Resp:    Temp:  99 F (37.2 C)  SpO2: 98% 100%    Intake/Output Summary (Last 24 hours) at 05/09/2019 1401 Last data filed at 05/09/2019 0756 Gross per 24 hour  Intake 800 ml  Output 750 ml  Net 50 ml   Filed Weights   05/07/19 0500 05/08/19 0415 05/09/19 0429  Weight: (!) 204 kg (!) 210.9 kg (!) 205.5 kg    ROS: Review of Systems  Unable to perform ROS: Acuity of condition   Exam: Physical Exam  HENT:  Nose: No mucosal edema.  Unable to look into mouth  Eyes: Pupils are equal, round, and reactive to light. Conjunctivae and lids are normal.  Neck: Carotid bruit is not present.  Cardiovascular: S1 normal and S2 normal. Tachycardia present.  Respiratory: He has decreased breath sounds in the right lower field and the left lower field. He has no wheezes. He has no rhonchi. He has no rales.  GI: Soft. Bowel sounds are normal. There is no abdominal tenderness.  Musculoskeletal:     Right ankle: Swelling present.     Left ankle: Swelling present.  Neurological:  Unresponsive to sternal rub  Skin: Skin is warm. Nails show no clubbing.  As per nursing staff stage IV decubiti buttock.  Wound VAC currently on.  Chronic lower extremity discoloration  Psychiatric:  Unresponsive to sternal rub      Data Reviewed: Basic Metabolic Panel: Recent Labs  Lab 05/04/19 0628 05/04/19 0628 05/05/19 2694 05/06/19 0443 05/07/19 0510 05/08/19 0632 05/09/19 0515  NA 139   < > 135 136 136 140 137  K 4.2   < > 4.4 4.8 4.6  4.2 4.3  CL 99   < > 95* 95* 97* 100 99  CO2 28   < > 28 32 28 30 29   GLUCOSE 195*   < > 217* 260* 249* 162* 245*  BUN 8   < > 10 11 11 12 13   CREATININE <0.30*   < > 0.32* 0.33* 0.37* 0.34* 0.30*  CALCIUM 9.6   < > 9.7 9.6 9.5 9.8 9.5  MG 1.8  --  1.8  --   --   --   --   PHOS 3.7  --   --   --   --   --   --    < > = values in this interval not displayed.   Liver Function Tests: Recent Labs  Lab 05/04/19 0628 05/06/19 0443  AST 43* 38  ALT 100* 102*  ALKPHOS 57 59  BILITOT 0.5 0.4  PROT 7.7 7.8  ALBUMIN 2.4* 2.3*   CBC: Recent Labs  Lab 05/04/19 0628 05/05/19 0611 05/08/19 0632  WBC 9.4 7.9 11.3*  NEUTROABS 5.2 4.8 6.9  HGB 9.9* 11.7* 10.6*  HCT 32.2* 38.3* 34.5*  MCV 85.2 85.1 85.4  PLT 394 401* 398    CBG: Recent Labs  Lab 05/08/19 1953 05/09/19 0014 05/09/19 0410 05/09/19 0824 05/09/19 1136  GLUCAP 233* 223* 244* 194* 191*  Recent Results (from the past 240 hour(s))  MRSA PCR Screening     Status: None   Collection Time: 04/29/19  3:47 PM   Specimen: Nasopharyngeal  Result Value Ref Range Status   MRSA by PCR NEGATIVE NEGATIVE Final    Comment:        The GeneXpert MRSA Assay (FDA approved for NASAL specimens only), is one component of a comprehensive MRSA colonization surveillance program. It is not intended to diagnose MRSA infection nor to guide or monitor treatment for MRSA infections. Performed at [facility], [address] [place], [place] [address]      Scheduled Meds: . aspirin EC  81 mg Oral Daily  . Chlorhexidine Gluconate Cloth  6 each Topical Daily  . enoxaparin (LOVENOX) injection  40 mg Subcutaneous Q12H  . famotidine  20 mg Per Tube BID  . free water  100 mL Per Tube Q4H  . furosemide  20 mg Per Tube Daily  . insulin aspart  0-20 Units Subcutaneous Q4H  . insulin glargine  40 Units Subcutaneous BID  . levETIRAcetam  1,000 mg Per Tube BID  . magnesium oxide  800 mg Per Tube Daily  . mouth rinse  15 mL  Mouth Rinse q12n4p  . metoprolol tartrate  12.5 mg Per Tube BID  . nutrition supplement (JUVEN)  1 packet Per Tube BID BM  . pneumococcal 23 valent vaccine  0.5 mL Intramuscular Tomorrow-1000   Continuous Infusions: . sodium chloride Stopped (05/01/19 0705)  . feeding supplement (GLUCERNA 1.5 CAL) 1,000 mL (05/08/19 0800)    Assessment/Plan:  1. Sepsis, present on admission due to stage IV sacral decubiti.  Patient seen by general surgery and completed vancomycin and Zosyn while here in the hospital.  Wound VAC ordered. 2. Tachycardia.  Increased dose of metoprolol to 25 mg twice a day.  Patient too large for a CT scan of the chest.  Patient already has an IVC filter. 3. Lower extremity edema.  Restart low-dose Lasix orally today. 4. Type 2 diabetes mellitus on Lantus 40 units subcutaneous injection twice a day and NovoLog sliding scale. 5. 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  Patient on aspirin.  Patient also on Keppra. 6. Acute metabolic encephalopathy.  Likely multifactorial with history of stroke and quadriplegia after COVID-19 infection.  Ordered CT scan of the head for comparison. 7. Obstructive sleep apnea, status post tracheostomy.  Pulmonary recommends trach to go home with. 8. Urinary retention.  Appreciate urology consultation for catheter reinsertion.  Catheter to be changed by home health every month. 9. Chronic hypoxemic respiratory failure.  Patient status post tracheostomy.  Patient on oxygen supplementation 10. History of bilateral lower extremity DVT status post IVC filter placement 11. Morbid obesity with a BMI of 65.00 12. Diarrhea.  Start Imodium.  Likely secondary to tube feedings.  Code Status:     Code Status Orders  (From admission, onward)         Start     Ordered   04/27/19 1533  Full code  Continuous     04/27/19 1532        Code Status History    This patient has a current code status but no historical code  status.   Advance Care Planning Activity     Family Communication: Spoke with wife on the phone Disposition Plan: Family planning on taking the patient home on Saturday.  Consultants:  General surgery  Time spent: 27 minutes  Suresh Audi [employer]  [facility]

## 2019-05-09 NOTE — Procedures (Signed)
Urology procedure note  Indication: Urinary retention with chronic Foley, nursing unable to replace Foley after removed  Briefly, he is a 24 year old extremely comorbid male with multiple complications after prolonged hospitalization for Covid infection including stroke with residual quadriplegia, seizures, subdural hematoma, bilateral lower extremity DVT, diabetes, sacral decubitus ulcer, and urinary retention requiring chronic Foley.  A 16 [place] two-way Foley had been in for 1 month and was removed by nursing this morning, however they were unable to replace the catheter.   The patient was prepped and draped in standard sterile fashion.  With his morbid obesity there was a large suprapubic fat pad that required assistance to retract to see the meatus.  Additional Betadine was used to prep the meatus.  An 29 French coud catheter passed easily into the bladder with return of yellow urine.  10 cc was placed in the balloon, and the catheter was connected to dependent drainage.  Patient does not require urology follow-up, or urology MD for Foley placement.  Would recommend 60 French coud two-way catheter for monthly changes, which can be placed by nursing or home health.  Legrand Rams, MD 05/09/2019

## 2019-05-10 LAB — BLOOD GAS, ARTERIAL
Acid-Base Excess: 8.8 mmol/L — ABNORMAL HIGH (ref 0.0–2.0)
Bicarbonate: 33.5 mmol/L — ABNORMAL HIGH (ref 20.0–28.0)
FIO2: 0.28
O2 Saturation: 98.9 %
Patient temperature: 37
pCO2 arterial: 45 mmHg (ref 32.0–48.0)
pH, Arterial: 7.48 — ABNORMAL HIGH (ref 7.350–7.450)
pO2, Arterial: 121 mmHg — ABNORMAL HIGH (ref 83.0–108.0)

## 2019-05-10 LAB — GLUCOSE, CAPILLARY
Glucose-Capillary: 227 mg/dL — ABNORMAL HIGH (ref 70–99)
Glucose-Capillary: 203 mg/dL — ABNORMAL HIGH (ref 70–99)
Glucose-Capillary: 189 mg/dL — ABNORMAL HIGH (ref 70–99)
Glucose-Capillary: 173 mg/dL — ABNORMAL HIGH (ref 70–99)
Glucose-Capillary: 219 mg/dL — ABNORMAL HIGH (ref 70–99)

## 2019-05-10 MED ORDER — METOPROLOL TARTRATE 25 MG PO TABS
25.0000 mg | ORAL_TABLET | Freq: Three times a day (TID) | ORAL | Status: DC
  Administered 2019-05-10 – 2019-05-11 (×3): 25 mg
  Filled 2019-05-10 (×3): qty 1

## 2019-05-10 MED ORDER — ASPIRIN 81 MG PO CHEW
81.0000 mg | CHEWABLE_TABLET | Freq: Every day | ORAL | Status: DC
  Administered 2019-05-10 – 2019-05-11 (×2): 81 mg
  Filled 2019-05-10 (×2): qty 1

## 2019-05-10 NOTE — Consult Note (Addendum)
WOC Nurse wound follow up Wound type:stage 4 sacral wound. NPWT dressing changed and bridged to left trochanter.  Left ischial scarring has opened up.  Noted moderate loose stool.  Fecal manager has been removed in anticipation of discharge.    Measurement:  8 cm x 11 cm x 4 cm  Wound ZPS:UGAYG red with 3 cm x 3 cm adherent slough present.  Boggy and bleeds with cleansing.  Drainage (amount, consistency, odor)moderate bleeding. No odor. Periwound:MARSI to periwound. Skin is protected with barrier ring.  Dressing procedure/placement/frequency:1 piece black foam to wound, 1 piece for bridge to trochanter. Seal is immediately achieved at 125. Change M/W/F WOC team will follow. Maple Hudson MSN, RN, FNP-BC CWON Wound, Ostomy, Continence Nurse Pager [phone]

## 2019-05-10 NOTE — Progress Notes (Signed)
   05/10/19 1500  Clinical Encounter Type  Visited With Patient  Visit Type Follow-up  Referral From Chaplain  Consult/Referral To Chaplain  While doing rounds Chaplain stopped by to check on patient. Chaplain stood by the bedside and offered a silent prayer.

## 2019-05-10 NOTE — Progress Notes (Signed)
PT Cancellation Note  Patient Details Name: Paul Hall MRN: [id number] DOB: 1995-11-28   Cancelled Treatment:    Reason Eval/Treat Not Completed: Patient's level of consciousness(PT has completed family education for UE and LE P/ROM in bed for DC. No other skilled PT intervention needed at this time.) Over three treatment session, pt has somnolent, does not rouse to stimulus, does not attempt to receive/express communication in anyway. Pt unable to participate in PT services directly. Recommend Additional caregiver education for wound care and bed positioning from nursing as needed. Pt/family need PT services in home to educate on caregiver bed mobility, positioning, hospital bed instruction, hoyer lift instruction. No additional skilled PT services needed at this time. PT signing off.   4:18 PM, 05/10/19 Rosamaria Lints, PT, DPT Physical Therapist - [facility]  [phone] (ASCOM)   Gerome Kokesh C 05/10/2019, 4:16 PM

## 2019-05-10 NOTE — TOC Progression Note (Addendum)
Transition of Care [facility]) - Progression Note    Patient Details  Name: Paul Hall MRN: [id number] Date of Birth: 09-02-1995  Transition of Care [facility]) CM/SW [place], LCSW Phone Number: 05/10/2019, 1:47 PM  Clinical Narrative:  CSW met with patient's wife at bedside. She said mattress and trach supplies have not been delivered yet but hopefully will be later today. CSW explained foley supplies will likely take longer to be delivered but definitely will be before his foley needs to be changed. Will go ahead and get EMS paperwork together and put in discharge packet for tomorrow. Asked social work Environmental consultant to give EMS a heads up of discharge home tomorrow.    Expected Discharge Plan: Home/Self Care Barriers to Discharge: Barriers Resolved  Expected Discharge Plan and Services Expected Discharge Plan: Home/Self Care   Discharge Planning Services: CM Consult   Living arrangements for the past 2 months: [place], [place]                         Representative spoke with at DME Agency: Spoke with Leroy Sea regarding equipment being picked up, he reports that he will take care of it         Representative spoke with at [place]: Corene Cornea, [facility]   Social Determinants of Health (SDOH) Interventions    Readmission Risk Interventions No flowsheet data found.

## 2019-05-10 NOTE — Progress Notes (Signed)
Patient ID: Paul Hall, male   DOB: 1996-01-12, 24 y.o.   MRN: [id number] [facility] PROGRESS NOTE  Paul Hall CBS:[id number] DOB: [birth year] DOA: 04/27/2019 PCP: Cory Munch, PA-C  HPI/Subjective: Patient for me, did grimace to to sternal rub.  With the speech pathologist was able to swallow some water and some ice chips.  Objective: Vitals:   05/10/19 0600 05/10/19 0951  BP: (!) 132/99 127/81  Pulse: (!) 117 (!) 117  Resp:    Temp:    SpO2: 100%     Intake/Output Summary (Last 24 hours) at 05/10/2019 1220 Last data filed at 05/10/2019 0600 Gross per 24 hour  Intake --  Output 1400 ml  Net -1400 ml   Filed Weights   05/08/19 0415 05/09/19 0429 05/10/19 0416  Weight: (!) 210.9 kg (!) 205.5 kg (!) 197.3 kg    ROS: Review of Systems  Unable to perform ROS: Acuity of condition   Exam: Physical Exam  HENT:  Nose: No mucosal edema.  Unable to look into mouth  Eyes: Pupils are equal, round, and reactive to light. Conjunctivae and lids are normal.  Neck: Carotid bruit is not present.  Cardiovascular: S1 normal and S2 normal. Tachycardia present.  Respiratory: He has decreased breath sounds in the right lower field and the left lower field. He has no wheezes. He has no rhonchi. He has no rales.  GI: Soft. Bowel sounds are normal. There is no abdominal tenderness.  Musculoskeletal:     Right ankle: Swelling present.     Left ankle: Swelling present.  Neurological:  Unresponsive to sternal rub  Skin: Skin is warm. Nails show no clubbing.  As per nursing staff stage IV decubiti buttock.  Wound VAC currently on.  Chronic lower extremity discoloration  Psychiatric:  Unresponsive to sternal rub      Data Reviewed: Basic Metabolic Panel: Recent Labs  Lab 05/04/19 0628 05/04/19 0628 05/05/19 8466 05/06/19 0443 05/07/19 0510 05/08/19 0632 05/09/19 0515  NA 139   < > 135 136 136 140 137  K 4.2   < > 4.4 4.8 4.6 4.2 4.3  CL 99   < > 95*  95* 97* 100 99  CO2 28   < > 28 32 28 30 29   GLUCOSE 195*   < > 217* 260* 249* 162* 245*  BUN 8   < > 10 11 11 12 13   CREATININE <0.30*   < > 0.32* 0.33* 0.37* 0.34* 0.30*  CALCIUM 9.6   < > 9.7 9.6 9.5 9.8 9.5  MG 1.8  --  1.8  --   --   --   --   PHOS 3.7  --   --   --   --   --   --    < > = values in this interval not displayed.   Liver Function Tests: Recent Labs  Lab 05/04/19 0628 05/06/19 0443  AST 43* 38  ALT 100* 102*  ALKPHOS 57 59  BILITOT 0.5 0.4  PROT 7.7 7.8  ALBUMIN 2.4* 2.3*   CBC: Recent Labs  Lab 05/04/19 0628 05/05/19 0611 05/08/19 0632  WBC 9.4 7.9 11.3*  NEUTROABS 5.2 4.8 6.9  HGB 9.9* 11.7* 10.6*  HCT 32.2* 38.3* 34.5*  MCV 85.2 85.1 85.4  PLT 394 401* 398    CBG: Recent Labs  Lab 05/09/19 2029 05/09/19 2337 05/10/19 0408 05/10/19 0705 05/10/19 1134  GLUCAP 191* 184* 227* 203* 189*      Scheduled Meds: .  aspirin  81 mg Per Tube Daily  . Chlorhexidine Gluconate Cloth  6 each Topical Daily  . enoxaparin (LOVENOX) injection  40 mg Subcutaneous Q12H  . famotidine  20 mg Per Tube BID  . free water  100 mL Per Tube Q4H  . furosemide  20 mg Per Tube Daily  . insulin aspart  0-20 Units Subcutaneous Q4H  . insulin glargine  40 Units Subcutaneous BID  . levETIRAcetam  1,000 mg Per Tube BID  . magnesium oxide  800 mg Per Tube Daily  . mouth rinse  15 mL Mouth Rinse q12n4p  . metoprolol tartrate  25 mg Per Tube BID  . nutrition supplement (JUVEN)  1 packet Per Tube BID BM  . pneumococcal 23 valent vaccine  0.5 mL Intramuscular Tomorrow-1000   Continuous Infusions: . sodium chloride Stopped (05/01/19 0705)  . feeding supplement (GLUCERNA 1.5 CAL) 1,000 mL (05/08/19 0800)    Assessment/Plan:  1. Sepsis, present on admission due to stage IV sacral decubiti.  Patient seen by general surgery and completed vancomycin and Zosyn while here in the hospital.  On wound VAC here. 2. Tachycardia.  Increased dose of metoprolol to 25 mg to 3 times a  day.  Patient too large for a CT scan of the chest.  Patient already has an IVC filter. 3. Lower extremity edema.  Continue low-dose Lasix orally today.  Patient diuresing well. 4. Type 2 diabetes mellitus on Lantus 40 units subcutaneous injection twice a day and NovoLog sliding scale. 5. 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  Patient on aspirin.  Patient also on Keppra.  Case discussed with neurology and no change in Keppra dose. 6. Acute metabolic encephalopathy.  Likely multifactorial with history of stroke and quadriplegia after COVID-19 infection.  CT scan of the head shows extensive previous stroke.  Family states that he does better around them that he does here in the hospital. 7. Obstructive sleep apnea, status post tracheostomy.  Pulmonary recommends trach to go home with.  ABG does not show any CO2 retention. 8. Urinary retention.  Foley changed on 05/09/2019.  Catheter to be changed by home health every month. 9. Acute hypoxemic respiratory failure.  Patient status post tracheostomy.  Patient on humidification and off the oxygen at this time 10. History of bilateral lower extremity DVT status post IVC filter placement 11. Morbid obesity with a BMI of 62.42 12. Diarrhea.  As needed Imodium  Code Status:     Code Status Orders  (From admission, onward)         Start     Ordered   04/27/19 1533  Full code  Continuous     04/27/19 1532        Code Status History    This patient has a current code status but no historical code status.   Advance Care Planning Activity     Family Communication: Spoke with wife at the bedside and mother on the phone Disposition Plan: I will see the patient tomorrow morning for potential discharge home as per family request.  Case discussed with transitional care team to have everything set up for going home tomorrow.  Consultants:  General surgery  Time spent: 29 minutes, in coordination of care.  Saadiq Poche  [employer]  [facility] [employer]

## 2019-05-10 NOTE — Evaluation (Signed)
Clinical/Bedside Swallow Evaluation Patient Details  Name: Paul Hall MRN: [id number] Date of Birth: [birth year]  Today's Date: 05/10/2019 Time: SLP Start Time (ACUTE ONLY): 1100 SLP Stop Time (ACUTE ONLY): 1200 SLP Time Calculation (min) (ACUTE ONLY): 60 min  Past Medical History:  Past Medical History:  Diagnosis Date  . Diabetes mellitus (HCC)   . Hypertension   . Stroke [facility])    Past Surgical History:  Past Surgical History:  Procedure Laterality Date  . IVC FILTER PLACEMENT ([facility] HX)    . LEG SURGERY    . PEG TUBE PLACEMENT    . TRACHEOSTOMY     HPI:  Paul Hall is an 24 y.o. 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  He was admitted to [facility] in [place] on 01/20/2019 and discharged to acute rehab on 03/15/2019.  According to his wife at the bedside, patient was discharged from the rehab facility a few weeks ago.  Patient is nonverbal and unable to provide any history so history was obtained from chart review and his wife at the bedside.  His wife said patient was brought to the emergency room at the recommendation of his PCP for evaluation of sacral decubitus ulcer.  She said she developed a sacral ulcer from his hospitalization at [facility].  She said the patient could not be turned while he was on ECMO and that led to the development of the ulcer.  She also said that there has been intermittent drainage from the wound.  She also said PCP was concerned about the tracheostomy and said that  patient no longer required a tracheostomy wanted to be removed.  She does not report any fever or vomiting.  His wife said that patient is nonverbal but sometimes he can move his extremities from side to side. ED Course:  The patient was tachypneic and tachycardic in the emergency room.  There was concern for sepsis secondary to sacral wound infection so he was given 2 L of normal saline, IV cefepime, IV Flagyl and vancomycin in the emergency room.    SWALLOWING HISTORY: The patient's wife and mother report that they have been giving the patient ice chips and water prior to this hospital admission.  They report no indicators of aspiration- no choking/coughing.  In addition, imaging at admission demonstrated clear lungs.  They had begun trials of pureed foods (applesauce, yogurt, etc) but this appeared to raise his blood sugars.     Assessment / Plan / Recommendation Clinical Impression  The patient was seen with his wife present.  She reported on his status at home: he will follow some simple commands such as opening his mouth, showing his teeth and kissing his wife on the cheek.  They have been giving the patient ice chips, water, and some trials of purees.  The patient's mother was able to join [place] via FaceTime.  The patient did not respond to me, he did kiss his wife on the cheek while I was present, this appeared to be a purposeful movement.  The patient's wife applied his speaking valve, no distress but no phonation observed.  She brushed his teeth, with the patient demonstrating purposeful movements- open  mouth, stick out tongue, etc.  The patient's wife gave him ice chips via spoon- the patient opened his mouth, accepted the ice chips, chewed the ice chips, and swallowed with no indicators of aspiration.  The patient's wife gave him water, via straw, and the patient was able to pull water through the straw and swallow with no overt indicators of distress or aspiration. Recommend that the patient's wife  give him ice chips/water while he remains in the hospital. The patient has not been receiving speech therapy in the home due to funding issues.  The patient is definitely ready for continuing PO trials and developing a plan for resuming an oral diet.  Recommend home health speech therapy for dysphagia and communication.   SLP Visit Diagnosis: Dysphagia, oropharyngeal phase (R13.12)    Aspiration Risk  Mild aspiration risk    Diet Recommendation NPO(Trial POs with speech therapy and trained family)        Other  Recommendations Oral Care Recommendations: Oral care prior to ice chip/H20   Follow up Recommendations Home health SLP      Frequency and Duration min 2x/week          Prognosis Prognosis for Safe Diet Advancement: Good Barriers to Reach Goals: Severity of deficits      Swallow Study   General HPI: Paul Hall is an 24 y.o. 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  He was admitted to [facility] in [place] on 01/20/2019 and discharged to acute rehab on 03/15/2019.  According to his wife at the bedside, patient was discharged from the rehab facility a few weeks ago.  Patient is nonverbal and unable to provide any history so history was obtained from chart review and his wife at the bedside.  His wife said patient was brought to the emergency room at the recommendation of his PCP for evaluation of sacral decubitus ulcer.  She said she developed a sacral ulcer from his hospitalization at [facility].  She  said the patient could not be turned while he was on ECMO and that led to the development of the ulcer.  She also said that there has been intermittent drainage from the wound.  She also said PCP was concerned about the tracheostomy and said that patient no longer required a tracheostomy wanted to be removed.  She does not report any fever or vomiting.  His wife said that patient is nonverbal but sometimes he can move his extremities from side to side. ED Course:  The patient was tachypneic and tachycardic in the emergency room.  There was concern for sepsis secondary to sacral wound infection so he was given 2 L of normal saline, IV cefepime, IV Flagyl and vancomycin in the emergency room.  SWALLOWING HISTORY: The patient's wife and mother report that they have been giving the patient ice chips and water prior to this hospital admission.  They report no indicators of aspiration- no choking/coughing.  In addition, imaging at admission demonstrated clear lungs.  They had begun trials of pureed foods (applesauce, yogurt, etc) but this appeared to raise his blood sugars.   Type of Study: Bedside Swallow Evaluation Previous Swallow Assessment: Unknown Diet Prior  to this Study: PEG tube;Other (Comment)(Trial POs) Temperature Spikes Noted: No Behavior/Cognition: Doesn't follow directions Oral Cavity Assessment: Within Functional Limits Oral Care Completed by SLP: Other (Comment)(Wife) Oral Cavity - Dentition: Adequate natural dentition Self-Feeding Abilities: Total assist Patient Positioning: Upright in bed Baseline Vocal Quality: Aphonic Volitional Cough: Cognitively unable to elicit Volitional Swallow: Unable to elicit    Oral/Motor/Sensory Function Overall Oral Motor/Sensory Function: (Cannot assess)   Ice Chips Ice chips: Within functional limits Presentation: Spoon   Thin Liquid Thin Liquid: Within functional limits Presentation: Straw    Nectar Thick     Honey Thick     Puree     Solid            Leroy Sea, MS/CCC- SLP  Lou Miner 05/10/2019,1:29 PM

## 2019-05-11 DIAGNOSIS — L98423 Non-pressure chronic ulcer of back with necrosis of muscle: Secondary | ICD-10-CM

## 2019-05-11 LAB — GLUCOSE, CAPILLARY
Glucose-Capillary: 185 mg/dL — ABNORMAL HIGH (ref 70–99)
Glucose-Capillary: 202 mg/dL — ABNORMAL HIGH (ref 70–99)
Glucose-Capillary: 202 mg/dL — ABNORMAL HIGH (ref 70–99)
Glucose-Capillary: 213 mg/dL — ABNORMAL HIGH (ref 70–99)

## 2019-05-11 MED ORDER — JUVEN PO PACK: 1.0000 | PACK | Freq: Two times a day (BID) | ORAL | 0 refills | Status: DC

## 2019-05-11 MED ORDER — ACETAMINOPHEN 325 MG PO TABS: 650.0000 mg | ORAL_TABLET | Freq: Four times a day (QID) | ORAL | Status: DC | PRN

## 2019-05-11 MED ORDER — ENOXAPARIN SODIUM 40 MG/0.4ML ~~LOC~~ SOLN: 40.0000 mg | Freq: Two times a day (BID) | SUBCUTANEOUS | 0 refills | Status: DC

## 2019-05-11 MED ORDER — FAMOTIDINE 20 MG PO TABS: 20.0000 mg | ORAL_TABLET | Freq: Two times a day (BID) | ORAL | 0 refills | Status: AC

## 2019-05-11 MED ORDER — ASPIRIN 81 MG PO CHEW: 81.0000 mg | CHEWABLE_TABLET | Freq: Every day | ORAL | 0 refills | Status: AC

## 2019-05-11 MED ORDER — INSULIN ASPART 100 UNIT/ML FLEXPEN: 10.0000 [IU] | PEN_INJECTOR | Freq: Four times a day (QID) | SUBCUTANEOUS | 0 refills | Status: DC

## 2019-05-11 MED ORDER — INSULIN PEN NEEDLE 29G X 12MM MISC: 1.0000 | Freq: Two times a day (BID) | 0 refills | Status: DC

## 2019-05-11 MED ORDER — FREE WATER: 100.0000 mL | 0 refills | Status: DC

## 2019-05-11 MED ORDER — METOPROLOL TARTRATE 25 MG PO TABS: 25.0000 mg | ORAL_TABLET | Freq: Three times a day (TID) | ORAL | 0 refills | Status: AC

## 2019-05-11 MED ORDER — LOPERAMIDE HCL 2 MG PO CAPS: 4.0000 mg | ORAL_CAPSULE | Freq: Three times a day (TID) | ORAL | 0 refills | Status: DC | PRN

## 2019-05-11 MED ORDER — GLUCERNA 1.5 CAL PO LIQD: 1000.0000 mL | ORAL | Status: DC

## 2019-05-11 MED ORDER — INSULIN GLARGINE 100 UNIT/ML SOLOSTAR PEN: 40.0000 [IU] | PEN_INJECTOR | Freq: Two times a day (BID) | SUBCUTANEOUS | 0 refills | Status: DC

## 2019-05-11 MED ORDER — FUROSEMIDE 20 MG PO TABS: 20.0000 mg | ORAL_TABLET | Freq: Every day | ORAL | 0 refills | Status: AC

## 2019-05-11 MED ORDER — LEVETIRACETAM 100 MG/ML PO SOLN: 1000.0000 mg | Freq: Two times a day (BID) | ORAL | 0 refills | Status: DC

## 2019-05-11 NOTE — TOC Transition Note (Signed)
Transition of Care [facility]) - CM/SW Discharge Note   Patient Details  Name: Paul Hall MRN: [id number] Date of Birth: [birth year]  [facility] CM/SW Contact:  Dominic Pea, LCSW Phone Number: 05/11/2019, 4:30 PM   Clinical Narrative:    Patient medically ready for discharge home. August Saucer with [facility] of patient's discharge. Wife aware of discharge today. Transport packet completed and RN to call for EMS transport. No further TOC needs.      Barriers to Discharge: Barriers Resolved   Patient Goals and CMS Choice Patient states their goals for this hospitalization and ongoing recovery are:: Per family to get him home and taken care of.   Choice offered to / list presented to : Spouse, Parent  Discharge Placement                       Discharge Plan and Services   Discharge Planning Services: CM Consult                    Representative spoke with at DME Agency: Spoke with [name] regarding equipment being picked up, he reports that he will take care of it         Representative spoke with at [facility] Agency: [name], [facility]  Social Determinants of Health (SDOH) Interventions     Readmission Risk Interventions No flowsheet data found.

## 2019-05-11 NOTE — Progress Notes (Signed)
Patient discharged home via EMS to home with wife and mother. Extensive discharge education provided to wife and mother, no further questions at this time.

## 2019-05-11 NOTE — Discharge Summary (Signed)
[facility] NAME: Paul Hall    MR#:  [id number]  DATE OF BIRTH:  March 02, 1996  DATE OF ADMISSION:  04/27/2019 ADMITTING PHYSICIAN: Jennye Boroughs, MD  DATE OF DISCHARGE: 05/11/2019 12:15 PM  PRIMARY CARE PHYSICIAN: Cory Munch, PA-C    ADMISSION DIAGNOSIS:  Sepsis ([place]) [A41.9] Skin ulcer of sacrum with necrosis of muscle ([place]) [L98.423] Sepsis, due to unspecified organism, unspecified whether acute organ dysfunction present ([place]) [A41.9]  DISCHARGE DIAGNOSIS:  Principal Problem:   Sepsis ([place]) Active Problems:   H/O insulin dependent diabetes mellitus   History of CVA with residual deficit   Seizure disorder ([place])   Decubitus ulcer of sacral region, stage 4 (HCC)   Goals of care, counseling/discussion   Palliative care encounter   Tachycardia   Lower extremity edema   Acute metabolic encephalopathy   Obstructive sleep apnea   Morbid obesity with BMI of 60.0-69.9, adult ([place])   Urinary retention   Type 2 diabetes mellitus without complication, with long-term current use of insulin ([place])   SECONDARY DIAGNOSIS:   Past Medical History:  Diagnosis Date  . Diabetes mellitus ([place])   . Hypertension   . Stroke [facility])     HOSPITAL COURSE:   1.  Sepsis, present on admission due to stage IV sacral decubiti.  Patient was seen by general surgery for evaluation of the wound.  Patient completed an antibiotic course prior to my taking over his case.  The patient was also seen by wound care and they recommended a wound VAC and that was also prescribed upon discharge.  Can follow-up at the wound care center or with home health agency. 2.  Tachycardia.  I increased his dose of metoprolol 25 mg 3 times daily.  The patient is too large for a CT scan of the chest.  The patient already has an IVC filter. 3.  Lower extremity edema.  Continue low-dose Lasix for diuresis.  Patient has diuresed well and BMI is down to 62.42. 4.  Type 2  diabetes mellitus with hyperglycemia.  The patient is on Lantus 40 units subcutaneous injection twice a day and NovoLog switched over to 4 times a day 10 units. 5.  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  CT scan ordered by me showed extensive stroke on the left side of the brain.  Patient on aspirin. 6.  Acute metabolic encephalopathy.  Likely multifactorial with history of stroke and quadriplegia after COVID-19 infection.  CT scan shows extensive previous stroke.  Patient was also seen by neurology and they decreased the dose of the Keppra.  Patient was able to swallow small sips of water and ice chips with speech therapy. 7.  Obstructive sleep apnea status post tracheostomy.  Pulmonary recommends the patient go home with the tracheostomy.  An ABG did not show any CO2 retention.  Can go back on the CPAP at night but the trach will have to be capped for that. 8.  Seizure disorder on Keppra lower dose 1000 mg twice a day. 9.  Urinary retention.  Foley changed on 05/09/2019.  Catheter can be changed by home health every month. 10.  Acute hypoxemic respiratory failure.  Patient is status post tracheostomy.  Patient on humidification only and off oxygen at this point. 11.  Morbid obesity with a BMI of 62.42.  Weight loss needed 12.  Diarrhea.  We did give Imodium during the hospital course and this has improved.  As needed  Imodium.  Likely this is from tube feeds. 13.  Nutrition patient was given continuous tube feeds and free water via the PEG tube.  Family will continue this at home. 14.  Palliative care to follow as outpatient  Overall prognosis is poor, patient high risk for decline and cardiopulmonary arrest.  Patient high risk for readmission.   DISCHARGE CONDITIONS:   Fair  CONSULTS OBTAINED:  Treatment Team:  Pauletta Browns, MD  DRUG ALLERGIES:   Allergies  Allergen Reactions  . Morphine Hives    Patient has tolerated oxycodone at high amounts during  Nov 2020 hospitalization  . Benzonatate Other (See Comments)    DISCHARGE MEDICATIONS:   Allergies as of 05/11/2019      Reactions   Morphine Hives   Patient has tolerated oxycodone at high amounts during Nov 2020 hospitalization   Benzonatate Other (See Comments)      Medication List    STOP taking these medications   amLODipine 5 MG tablet Commonly known as: NORVASC   Aspirin Low Dose 81 MG EC tablet Generic drug: aspirin Replaced by: aspirin 81 MG chewable tablet   carvedilol 25 MG tablet Commonly known as: COREG   cephALEXin 250 MG/5ML suspension Commonly known as: KEFLEX   chlorthalidone 25 MG tablet Commonly known as: HYGROTON   levETIRAcetam 500 MG tablet Commonly known as: KEPPRA Replaced by: levETIRAcetam 100 MG/ML solution   NovoLIN R 100 units/mL injection Generic drug: insulin regular   psyllium 95 % Pack Commonly known as: HYDROCIL/METAMUCIL   Santyl ointment Generic drug: collagenase     TAKE these medications   acetaminophen 325 MG tablet Commonly known as: TYLENOL Place 2 tablets (650 mg total) into feeding tube every 6 (six) hours as needed for mild pain (or Fever >/= 101).   aspirin 81 MG chewable tablet Place 1 tablet (81 mg total) into feeding tube daily. Replaces: Aspirin Low Dose 81 MG EC tablet   enoxaparin 40 MG/0.4ML injection Commonly known as: LOVENOX Inject 0.4 mLs (40 mg total) into the skin every 12 (twelve) hours. What changed: when to take this   famotidine 20 MG tablet Commonly known as: PEPCID Place 1 tablet (20 mg total) into feeding tube 2 (two) times daily. What changed: how to take this   feeding supplement (GLUCERNA 1.5 CAL) Liqd Place 1,000 mLs into feeding tube continuous. What changed:   how much to take  how to take this  when to take this  additional instructions   nutrition supplement (JUVEN) Pack Place 1 packet into feeding tube 2 (two) times daily between meals. What changed: You were already  taking a medication with the same name, and this prescription was added. Make sure you understand how and when to take each.   free water Soln Place 100 mLs into feeding tube every 4 (four) hours.   furosemide 20 MG tablet Commonly known as: LASIX Place 1 tablet (20 mg total) into feeding tube daily.   insulin aspart 100 UNIT/ML FlexPen Commonly known as: NOVOLOG Inject 10 Units into the skin every 6 (six) hours.   Insulin Glargine 100 UNIT/ML Solostar Pen Commonly known as: LANTUS Inject 40 Units into the skin 2 (two) times daily.   Insulin Pen Needle 29G X Misc 1 Device by Does not apply route 2 (two) times daily.   levETIRAcetam 100 MG/ML solution Commonly known as: KEPPRA Place 10 mLs (1,000 mg total) into feeding tube 2 (two) times daily. Replaces: levETIRAcetam 500 MG tablet   loperamide 2  MG capsule Commonly known as: IMODIUM Take 2 capsules (4 mg total) by mouth every 8 (eight) hours as needed for diarrhea or loose stools.   magnesium oxide 400 MG tablet Commonly known as: MAG-OX Place 800 mg into feeding tube daily.   metoprolol tartrate 25 MG tablet Commonly known as: LOPRESSOR Place 1 tablet (25 mg total) into feeding tube 3 (three) times daily.            Durable Medical Equipment  (From admission, onward)         Start     Ordered   05/09/19 1400  For home use only DME Other see comment  Once    Comments: 27 French coud two way catheter for exchanges every month  Question:  Length of Need  Answer:  Lifetime   05/09/19 1400   05/09/19 1350  For home use only DME Other see comment  Once    Comments: Baradex Foley catheter 24 F with 5 cc rib balloon.  Reference number [id number]  Question:  Length of Need  Answer:  Lifetime   05/09/19 1350   05/08/19 1640  For home use only DME Other see comment  Once    Comments: 6 DCFS (6 mm distal cuff less) With cleaning kits for inner cannula  Question:  Length of Need  Answer:  Lifetime   05/08/19 1640    05/08/19 1639  For home use only DME Other see comment  Once    Comments: Bariatric air loss mattress due to stage IV decubitus ulcer  Question:  Length of Need  Answer:  Lifetime   05/08/19 1640   05/08/19 1639  For home use only DME Other see comment  Once    Comments: Foley supplies  Question:  Length of Need  Answer:  Lifetime   05/08/19 1640           DISCHARGE INSTRUCTIONS:   Follow-up PMD 5 days Can follow-up at the wound care center  If you experience worsening of your admission symptoms, develop shortness of breath, life threatening emergency, suicidal or homicidal thoughts you must seek medical attention immediately by calling 911 or calling your MD immediately  if symptoms less severe.  You Must read complete instructions/literature along with all the possible adverse reactions/side effects for all the Medicines you take and that have been prescribed to you. Take any new Medicines after you have completely understood and accept all the possible adverse reactions/side effects.   Please note  You were cared for by a hospitalist during your hospital stay. If you have any questions about your discharge medications or the care you received while you were in the hospital after you are discharged, you can call the unit and asked to speak with the hospitalist on call if the hospitalist that took care of you is not available. Once you are discharged, your primary care physician will handle any further medical issues. Please note that NO REFILLS for any discharge medications will be authorized once you are discharged, as it is imperative that you return to your primary care physician (or establish a relationship with a primary care physician if you do not have one) for your aftercare needs so that they can reassess your need for medications and monitor your lab values.    Today   CHIEF COMPLAINT:   Chief Complaint  Patient presents with  . Wound Check    HISTORY OF PRESENT  ILLNESS:  Paul Hall  is a 24 y.o. male came  in for wound check.   VITAL SIGNS:  Blood pressure 119/84, pulse (!) 116, temperature 99.2 F (37.3 C), temperature source Oral, resp. rate 18, height 5\' 10"  (1.778 m), weight (!) 197.3 kg, SpO2 100 %.  I/O:    Intake/Output Summary (Last 24 hours) at 05/11/2019 1534 Last data filed at 05/11/2019 1100 Gross per 24 hour  Intake 2180 ml  Output 1225 ml  Net 955 ml    PHYSICAL EXAMINATION:  GENERAL:  24 y.o.-year-old patient lying in the bed with no acute distress.  EYES: Pupils equal, round, reactive to light HEENT: Head atraumatic, normocephalic.  Unable to look into mouth NECK:  Supple, no jugular venous distention. No thyroid enlargement, no tenderness.  LUNGS: Normal breath sounds bilaterally, no wheezing, rales,rhonchi or crepitation. No use of accessory muscles of respiration.  CARDIOVASCULAR: S1, S2 tachycardic. No murmurs, rubs, or gallops.  ABDOMEN: Soft, non-tender, non-distended. Bowel sounds present. No organomegaly or mass.  EXTREMITIES: 3+ pedal edema, no cyanosis, or clubbing.  NEUROLOGIC: Patient open eyes today.  SKIN: Wound VAC cover decubitus  DATA REVIEW:   CBC Recent Labs  Lab 05/08/19 0632  WBC 11.3*  HGB 10.6*  HCT 34.5*  PLT 398    Chemistries  Recent Labs  Lab 05/05/19 0611 05/05/19 0611 05/06/19 0443 05/07/19 0510 05/09/19 0515  NA 135   < > 136   < > 137  K 4.4   < > 4.8   < > 4.3  CL 95*   < > 95*   < > 99  CO2 28   < > 32   < > 29  GLUCOSE 217*   < > 260*   < > 245*  BUN 10   < > 11   < > 13  CREATININE 0.32*   < > 0.33*   < > 0.30*  CALCIUM 9.7   < > 9.6   < > 9.5  MG 1.8  --   --   --   --   AST  --   --  38  --   --   ALT  --   --  102*  --   --   ALKPHOS  --   --  59  --   --   BILITOT  --   --  0.4  --   --    < > = values in this interval not displayed.     Microbiology Results  Results for orders placed or performed during the hospital encounter of 04/27/19  Blood  Culture (routine x 2)     Status: None   Collection Time: 04/27/19 12:43 PM   Specimen: BLOOD  Result Value Ref Range Status   Specimen Description BLOOD LEFT ANTECUBITAL  Final   Special Requests   Final    BOTTLES DRAWN AEROBIC AND ANAEROBIC Blood Culture results may not be optimal due to an inadequate volume of blood received in culture bottles   Culture   Final    NO GROWTH 5 DAYS Performed at [facility], [address] [place], [place] [address]    Report Status 05/02/2019 FINAL  Final  Blood Culture (routine x 2)     Status: Abnormal   Collection Time: 04/27/19 12:43 PM   Specimen: BLOOD  Result Value Ref Range Status   Specimen Description   Final    BLOOD BLOOD RIGHT FOREARM Performed at [facility], [address] [place], [place] [address]    Special  Requests   Final    BOTTLES DRAWN AEROBIC AND ANAEROBIC Blood Culture adequate volume Performed at [facility], [address] [place], [place] [address]    Culture  Setup Time   Final    AEROBIC BOTTLE ONLY GRAM POSITIVE RODS CRITICAL RESULT CALLED TO, READ BACK BY AND VERIFIED WITH: SCOTT HALL ON 04/28/19 AT 2229 [facility] Performed at [facility], [address] [place], [place] [address]    Culture (A)  Final    DIPHTHEROIDS(CORYNEBACTERIUM SPECIES) Standardized susceptibility testing for this organism is not available. Performed at [facility], [address] [place], [place] [address]    Report Status 05/01/2019 FINAL  Final  Urine culture     Status: Abnormal   Collection Time: 04/27/19 12:43 PM   Specimen: In/Out Cath Urine  Result Value Ref Range Status   Specimen Description   Final    IN/OUT CATH URINE Performed at [facility], [address], [place], [place] [address]    Special Requests   Final    NONE Performed at [facility], [address] [place], [place] [address]    Culture MULTIPLE SPECIES PRESENT, SUGGEST RECOLLECTION  (A)  Final   Report Status 04/29/2019 FINAL  Final  Respiratory Panel by RT PCR (Flu A&B, Covid) -     Status: None   Collection Time: 04/27/19 12:43 PM  Result Value Ref Range Status   SARS Coronavirus 2 by RT PCR NEGATIVE NEGATIVE Final    Comment: (NOTE) SARS-CoV-2 target nucleic acids are NOT DETECTED. The SARS-CoV-2 RNA is generally detectable in upper respiratoy specimens during the acute phase of infection. The lowest concentration of SARS-CoV-2 viral copies this assay can detect is 131 copies/mL. A negative result does not preclude SARS-Cov-2 infection and should not be used as the sole basis for treatment or other patient management decisions. A negative result may occur with  improper specimen collection/handling, submission of specimen other than nasopharyngeal swab, presence of viral mutation(s) within the areas targeted by this assay, and inadequate number of viral copies (<131 copies/mL). A negative result must be combined with clinical observations, patient history, and epidemiological information. The expected result is Negative. Fact Sheet for Patients:  https://www.moore.com/ Fact Sheet for Healthcare Providers:  https://www.young.biz/ This test is not yet ap proved or cleared by the [place] FDA and  has been authorized for detection and/or diagnosis of SARS-CoV-2 by FDA under an Emergency Use Authorization (EUA). This EUA will remain  in effect (meaning this test can be used) for the duration of the COVID-19 declaration under Section 564(b)(1) of the Act, 21 U.S.C. section 360bbb-3(b)(1), unless the authorization is terminated or revoked sooner.    Influenza A by PCR NEGATIVE NEGATIVE Final   Influenza B by PCR NEGATIVE NEGATIVE Final    Comment: (NOTE) The Xpert Xpress SARS-CoV-2/FLU/RSV assay is intended as an aid in  the diagnosis of influenza from Nasopharyngeal swab specimens and  should not be used as a sole  basis for treatment. Nasal washings and  aspirates are unacceptable for Xpert Xpress SARS-CoV-2/FLU/RSV  testing. Fact Sheet for Patients: https://www.moore.com/ Fact Sheet for Healthcare Providers: https://www.young.biz/ This test is not yet approved or cleared by the [place] FDA and  has been authorized for detection and/or diagnosis of SARS-CoV-2 by  FDA under an Emergency Use Authorization (EUA). This EUA will remain  in effect (meaning this test can be used) for the duration of the  Covid-19 declaration under Section 564(b)(1)  of the Act, 21  U.S.C. section 360bbb-3(b)(1), unless the authorization is  terminated or revoked. Performed at [facility], [address] [place], [place] [address]   C difficile quick scan w PCR reflex     Status: None   Collection Time: 04/27/19  1:10 PM   Specimen: Stool  Result Value Ref Range Status   C Diff antigen NEGATIVE NEGATIVE Final   C Diff toxin NEGATIVE NEGATIVE Final   C Diff interpretation No C. difficile detected.  Final    Comment: Performed at [facility], [address] [place], [place] [address]  MRSA PCR Screening     Status: None   Collection Time: 04/29/19  3:47 PM   Specimen: Nasopharyngeal  Result Value Ref Range Status   MRSA by PCR NEGATIVE NEGATIVE Final    Comment:        The GeneXpert MRSA Assay (FDA approved for NASAL specimens only), is one component of a comprehensive MRSA colonization surveillance program. It is not intended to diagnose MRSA infection nor to guide or monitor treatment for MRSA infections. Performed at [facility], [address] [place], [place] [address]        Management plans discussed with the patient, family and they are in agreement.  CODE STATUS:     Code Status Orders  (From admission, onward)         Start     Ordered   04/27/19 1533  Full code  Continuous     04/27/19 1532        Code  Status History    This patient has a current code status but no historical code status.   Advance Care Planning Activity      TOTAL TIME TAKING CARE OF THIS PATIENT: 38 minutes.    Alford Highland M.D on 05/11/2019 at 3:34 PM  Between 7am to 6pm - Pager - [phone]  After 6pm go to [email or web address] - password [email or web address]  [facility]  CC: Primary care physician; Shawnie Dapper, PA-C

## 2019-05-13 DIAGNOSIS — T8189XA Other complications of procedures, not elsewhere classified, initial encounter: Secondary | ICD-10-CM | POA: Diagnosis not present

## 2019-05-13 DIAGNOSIS — G4733 Obstructive sleep apnea (adult) (pediatric): Secondary | ICD-10-CM | POA: Diagnosis not present

## 2019-05-13 DIAGNOSIS — Z930 Tracheostomy status: Secondary | ICD-10-CM | POA: Diagnosis not present

## 2019-05-14 ENCOUNTER — Telehealth: Payer: Self-pay | Admitting: Adult Health Nurse Practitioner

## 2019-05-14 DIAGNOSIS — Z930 Tracheostomy status: Secondary | ICD-10-CM | POA: Diagnosis not present

## 2019-05-14 DIAGNOSIS — I619 Nontraumatic intracerebral hemorrhage, unspecified: Secondary | ICD-10-CM | POA: Diagnosis not present

## 2019-05-14 DIAGNOSIS — E119 Type 2 diabetes mellitus without complications: Secondary | ICD-10-CM | POA: Diagnosis not present

## 2019-05-14 DIAGNOSIS — I639 Cerebral infarction, unspecified: Secondary | ICD-10-CM | POA: Diagnosis not present

## 2019-05-14 DIAGNOSIS — R532 Functional quadriplegia: Secondary | ICD-10-CM | POA: Diagnosis not present

## 2019-05-14 NOTE — Telephone Encounter (Signed)
Called patient's wife Monico Blitz to schedule the Palliative Consult, no answer - left message with reason for call along with my contact information.

## 2019-05-14 NOTE — Telephone Encounter (Signed)
Returned call to wife Monico Blitz and discussed Palliative services with her and she was in agreement with this.  I have scheduled a Telehealth Zoom Consult for 05/20/19 @ 1 PM.

## 2019-05-15 DIAGNOSIS — G4730 Sleep apnea, unspecified: Secondary | ICD-10-CM | POA: Diagnosis not present

## 2019-05-15 DIAGNOSIS — I6389 Other cerebral infarction: Secondary | ICD-10-CM | POA: Diagnosis not present

## 2019-05-15 DIAGNOSIS — E1165 Type 2 diabetes mellitus with hyperglycemia: Secondary | ICD-10-CM | POA: Diagnosis not present

## 2019-05-15 DIAGNOSIS — G4733 Obstructive sleep apnea (adult) (pediatric): Secondary | ICD-10-CM | POA: Diagnosis not present

## 2019-05-18 DIAGNOSIS — Z930 Tracheostomy status: Secondary | ICD-10-CM | POA: Diagnosis not present

## 2019-05-20 ENCOUNTER — Telehealth: Payer: Self-pay | Admitting: Adult Health Nurse Practitioner

## 2019-05-20 ENCOUNTER — Other Ambulatory Visit: Payer: Self-pay

## 2019-05-20 ENCOUNTER — Other Ambulatory Visit: Payer: BC Managed Care – PPO | Admitting: Adult Health Nurse Practitioner

## 2019-05-20 DIAGNOSIS — I6930 Unspecified sequelae of cerebral infarction: Secondary | ICD-10-CM | POA: Diagnosis not present

## 2019-05-20 DIAGNOSIS — Z515 Encounter for palliative care: Secondary | ICD-10-CM | POA: Diagnosis not present

## 2019-05-20 NOTE — Telephone Encounter (Signed)
Patient/family did not show for scheduled Zoom visit.  Called and left VM with reason for call and contact info to reschedule. Paul Kossman K. Garner Nash NP

## 2019-05-21 NOTE — Progress Notes (Signed)
Therapist, nutritional Palliative Care Consult Note Telephone: [phone]  Fax: [phone]  PATIENT NAME: Paul Hall DOB: Nov 12, 1995 MRN: [id number]  PRIMARY CARE PROVIDER:   Samuella Hall  REFERRING PROVIDER:  Shawnie Dapper, PA-C [address] [place],  [place] [address]  RESPONSIBLE PARTY:  Paul Hall, wife [phone] Paul Hall, mother [phone]   Due to the COVID-19 crisis, this visit was done via telemedicine and it was initiated and consent by this patient and or family.    RECOMMENDATIONS and PLAN:  1.  Advanced care planning.  Patient is a full code  2.  Quadriplegia post CVA.  Patient hospitalized for COVID 10/18-12/02/2019 with resultant bilateral DVT of femoral veins, quadriplegia post CVA.  Patient found to have subdural hematoma.  Patient also treated for seizures. Patient hospitalized 1/23-2/09-2019 for sepsis related to sacral wound infection.  Hospital course complicated with respiratory distress requiring trach and ventilation.  Patient was discharged with trach, foley, and wound vac.  Patient is not receiving O2 through the trach.  Patient was less responsive while in the hospital and not following commands.  Wife states that when he was started on Keppra based on weight.  His Keppra dose has since been decreased and he has become more responsive.  Family states that he is able to follow commands, is starting to move his extremities, and is starting to mouth words.   3.  Functional status.  Patient is bedbound requiring total care.  As above is starting to move his extremities. Wife, mother, father, and brother all take care of him at home. They are working ROM exercise to keep his muscles conditioned. Due to having trach patient has had difficulty setting up home health services.  Family states that PCP is trying to get ENT referral to get evaluation for trach removal. Trying to find ENT who can come into  the home as it is difficult for the patient to go out of the home for appointments. Right now a family member who is a nurse is changing the wound vac and foley.  Once patient is evaluated for trach removal and trach is removed he will be able to get home health services and start PT and OT.  Have reached out to RN navigator for help with finding an ENT or other provider that can help with trach evaluation.  4.  Nutritional status.  Patient has aphasia post CVA.  He has PEG tube in which he gets most of his nutrition.  Family has started feeding him baby food BID and he has been tolerating this well.  They are ensuring he is getting 50-69 ounces of water per day and are flushing his PEG tube with 120 ml of water 6-7 times a day.    Family doing well with supporting patient during this time of recovery.  Discussed that this will be a long journey of recovery.  Palliative will continue to monitor of symptom management and offer support during this time.  Have next appointment in 2 weeks.  I spent 60 minutes providing this consultation, including time with patient/family, chart review, provider coordination, and documentation. More than 50% of the time in this consultation was spent coordinating communication.   HISTORY OF PRESENT ILLNESS:  Paul Hall is a 24 y.o. year old male with multiple medical problems including DMT2, CVA with resulting quadraplegia post COVID infection, seizures. Palliative Care was asked to help address goals of care. Prior to complications related  to COVID patient help minister to youth, played drums, and was a Corporate treasurer.  Family states that he was determined and optimistic his whole life and are encouraged that he will work hard to recover.  CODE STATUS: full code  PPS: 20% HOSPICE ELIGIBILITY/DIAGNOSIS: TBD  PHYSICAL EXAM:   Deferred  PAST MEDICAL HISTORY:  Past Medical History:  Diagnosis Date  . Diabetes mellitus ([place])   . Hypertension   . Stroke  [facility])     SOCIAL HX:  Social History   Tobacco Use  . Smoking status: Never Smoker  . Smokeless tobacco: Never Used  Substance Use Topics  . Alcohol use: No    ALLERGIES:  Allergies  Allergen Reactions  . Morphine Hives    Patient has tolerated oxycodone at high amounts during Nov 2020 hospitalization  . Benzonatate Other (See Comments)     PERTINENT MEDICATIONS:  Outpatient Encounter Medications as of 05/20/2019  Medication Sig  . acetaminophen (TYLENOL) 325 MG tablet Place 2 tablets (650 mg total) into feeding tube every 6 (six) hours as needed for mild pain (or Fever >/= 101).  Marland Kitchen aspirin 81 MG chewable tablet Place 1 tablet (81 mg total) into feeding tube daily.  Marland Kitchen enoxaparin (LOVENOX) 40 MG/0.4ML injection Inject 0.4 mLs (40 mg total) into the skin every 12 (twelve) hours.  . famotidine (PEPCID) 20 MG tablet Place 1 tablet (20 mg total) into feeding tube 2 (two) times daily.  . furosemide (LASIX) 20 MG tablet Place 1 tablet (20 mg total) into feeding tube daily.  . insulin aspart (NOVOLOG) 100 UNIT/ML FlexPen Inject 10 Units into the skin every 6 (six) hours.  . Insulin Glargine (LANTUS) 100 UNIT/ML Solostar Pen Inject 40 Units into the skin 2 (two) times daily.  . Insulin Pen Needle 29G X 12MM MISC 1 Device by Does not apply route 2 (two) times daily.  Marland Kitchen levETIRAcetam (KEPPRA) 100 MG/ML solution Place 10 mLs (1,000 mg total) into feeding tube 2 (two) times daily.  Marland Kitchen loperamide (IMODIUM) 2 MG capsule Take 2 capsules (4 mg total) by mouth every 8 (eight) hours as needed for diarrhea or loose stools.  . magnesium oxide (MAG-OX) 400 MG tablet Place 800 mg into feeding tube daily.  . metoprolol tartrate (LOPRESSOR) 25 MG tablet Place 1 tablet (25 mg total) into feeding tube 3 (three) times daily.  . nutrition supplement, JUVEN, (JUVEN) PACK Place 1 packet into feeding tube 2 (two) times daily between meals.  . Nutritional Supplements (FEEDING SUPPLEMENT, GLUCERNA 1.5 CAL,) LIQD  Place 1,000 mLs into feeding tube continuous.  . Water For Irrigation, Sterile (FREE WATER) SOLN Place 100 mLs into feeding tube every 4 (four) hours.   No facility-administered encounter medications on file as of 05/20/2019.      Crescent Gotham Jenetta Downer, NP

## 2019-05-24 DIAGNOSIS — T8189XA Other complications of procedures, not elsewhere classified, initial encounter: Secondary | ICD-10-CM | POA: Diagnosis not present

## 2019-05-25 DIAGNOSIS — T8189XA Other complications of procedures, not elsewhere classified, initial encounter: Secondary | ICD-10-CM | POA: Diagnosis not present

## 2019-05-29 DIAGNOSIS — N390 Urinary tract infection, site not specified: Secondary | ICD-10-CM | POA: Diagnosis not present

## 2019-05-31 DIAGNOSIS — L8994 Pressure ulcer of unspecified site, stage 4: Secondary | ICD-10-CM | POA: Diagnosis not present

## 2019-05-31 DIAGNOSIS — N390 Urinary tract infection, site not specified: Secondary | ICD-10-CM | POA: Diagnosis not present

## 2019-05-31 DIAGNOSIS — I6389 Other cerebral infarction: Secondary | ICD-10-CM | POA: Diagnosis not present

## 2019-06-02 DIAGNOSIS — E119 Type 2 diabetes mellitus without complications: Secondary | ICD-10-CM | POA: Diagnosis not present

## 2019-06-02 DIAGNOSIS — I639 Cerebral infarction, unspecified: Secondary | ICD-10-CM | POA: Diagnosis not present

## 2019-06-02 DIAGNOSIS — I619 Nontraumatic intracerebral hemorrhage, unspecified: Secondary | ICD-10-CM | POA: Diagnosis not present

## 2019-06-02 DIAGNOSIS — R532 Functional quadriplegia: Secondary | ICD-10-CM | POA: Diagnosis not present

## 2019-06-03 DIAGNOSIS — T8189XA Other complications of procedures, not elsewhere classified, initial encounter: Secondary | ICD-10-CM | POA: Diagnosis not present

## 2019-06-04 ENCOUNTER — Other Ambulatory Visit: Payer: BC Managed Care – PPO | Admitting: Adult Health Nurse Practitioner

## 2019-06-04 ENCOUNTER — Other Ambulatory Visit: Payer: Self-pay

## 2019-06-04 DIAGNOSIS — G8250 Quadriplegia, unspecified: Secondary | ICD-10-CM | POA: Diagnosis not present

## 2019-06-04 DIAGNOSIS — E1165 Type 2 diabetes mellitus with hyperglycemia: Secondary | ICD-10-CM | POA: Diagnosis not present

## 2019-06-04 DIAGNOSIS — Z1612 Extended spectrum beta lactamase (ESBL) resistance: Secondary | ICD-10-CM | POA: Diagnosis not present

## 2019-06-04 DIAGNOSIS — B952 Enterococcus as the cause of diseases classified elsewhere: Secondary | ICD-10-CM | POA: Diagnosis not present

## 2019-06-04 DIAGNOSIS — I69365 Other paralytic syndrome following cerebral infarction, bilateral: Secondary | ICD-10-CM | POA: Diagnosis not present

## 2019-06-04 DIAGNOSIS — N3000 Acute cystitis without hematuria: Secondary | ICD-10-CM | POA: Diagnosis not present

## 2019-06-04 DIAGNOSIS — G9341 Metabolic encephalopathy: Secondary | ICD-10-CM | POA: Diagnosis not present

## 2019-06-04 DIAGNOSIS — Z515 Encounter for palliative care: Secondary | ICD-10-CM | POA: Diagnosis not present

## 2019-06-04 DIAGNOSIS — I6930 Unspecified sequelae of cerebral infarction: Secondary | ICD-10-CM | POA: Diagnosis not present

## 2019-06-04 DIAGNOSIS — I10 Essential (primary) hypertension: Secondary | ICD-10-CM | POA: Diagnosis not present

## 2019-06-04 DIAGNOSIS — R000 Tachycardia, unspecified: Secondary | ICD-10-CM | POA: Diagnosis not present

## 2019-06-04 DIAGNOSIS — J9601 Acute respiratory failure with hypoxia: Secondary | ICD-10-CM | POA: Diagnosis not present

## 2019-06-04 DIAGNOSIS — G4733 Obstructive sleep apnea (adult) (pediatric): Secondary | ICD-10-CM | POA: Diagnosis not present

## 2019-06-04 DIAGNOSIS — L89154 Pressure ulcer of sacral region, stage 4: Secondary | ICD-10-CM | POA: Diagnosis not present

## 2019-06-04 DIAGNOSIS — I69320 Aphasia following cerebral infarction: Secondary | ICD-10-CM | POA: Diagnosis not present

## 2019-06-04 DIAGNOSIS — R339 Retention of urine, unspecified: Secondary | ICD-10-CM | POA: Diagnosis not present

## 2019-06-04 DIAGNOSIS — D649 Anemia, unspecified: Secondary | ICD-10-CM | POA: Diagnosis not present

## 2019-06-04 DIAGNOSIS — G40909 Epilepsy, unspecified, not intractable, without status epilepticus: Secondary | ICD-10-CM | POA: Diagnosis not present

## 2019-06-04 NOTE — Progress Notes (Signed)
[facility] Consult Note Telephone: [phone]  Fax: [phone]  PATIENT NAME: Paul Hall DOB: 1995/12/23 MRN: [id number]  PRIMARY CARE PROVIDER:   Ginger Organ  REFERRING PROVIDER:  Cory Munch, PA-C [address] [place],  [place] [address]  RESPONSIBLE PARTY:  [name], wife [phone] [name], mother [phone]         RECOMMENDATIONS and PLAN:  1.  Advanced care planning.  Patient is a full code  2.  Quadriplegia post CVA.  Patient's trach and PEG tube have been removed.  He is eating meals a day orally and drinking thin liquids.  Observed today eating spinach, rice, and salisbury steak with no difficulty.  He is sipping water through a straw with no difficulty.  Patient is not able to verbalize at this time but through his facial reactions appears to be understanding what is said. He does get tearful when he is not able to voice what he wants to say. He will follow others in the room with his eyes.  Family states that he appears to mouth words when he is sleeping.  He will laugh when his brother and dad are in the room entertaining him. Family does state that he will have slight movement in his legs and arms.  He is holding a folded wash cloth in his right hand today.  Family states that he will move his head to help when washing and repositioning.  Insurance has approved home health services and he will be starting PT/ST with [facility].  He will also be receiving RN services to help with wound vac and foley care.    3.  Sacral wound.  Patient has wound vac to sacral wound.  There is a moderate amount of bloody drainage noted in the canister.  Family showed pictures of the progress of the wound.  It appears to be a stage III injury.  Family states that it has improved from a stage IV.  Continue home health services for wound care.   Patient is making progress with having the trach and  PEG tube removed.  He is eating well and able to get all nutrition orally. He is understanding verbal conversation and able to follow commands/direction.  Working with therapy will be a long process and the family understands this.  Palliative will continue to monitor for symptom management and coordination of care.  Have next appointment in six weeks.     I spent 91minutes providing this consultation,  from 3:00 to 4:00. More than 50% of the time in this consultation was spent coordinating communication.   HISTORY OF PRESENT ILLNESS:  Paul Hall is a 24 y.o. year old male with multiple medical problems including DMT2, CVA with resulting quadraplegia post COVID infection, seizures. Palliative Care was asked to help address goals of care.   CODE STATUS: full code  PPS: 30% HOSPICE ELIGIBILITY/DIAGNOSIS: TBD  PHYSICAL EXAM:  HR 100  O2 99% on RA  BS 123 temp 97.7   General: patient lying in bed in NAD Cardiovascular: regular rate and rhythm Pulmonary: clear ant fields; normal respiratory effort Abdomen: soft, nontender, + bowel sounds GU: foley catheter in place with pale yellow urine noted in bag with no hematuria or sediment noted. Extremities: no edema, no joint deformities Skin: no rashes on exposed skin Neurological: Patient has quadriplegia and  related to CVA  PAST MEDICAL HISTORY:  Past Medical History:  Diagnosis Date  .  Diabetes mellitus ([facility])   . Hypertension   . Stroke [facility])     SOCIAL HX:  Social History   Tobacco Use  . Smoking status: Never Smoker  . Smokeless tobacco: Never Used  Substance Use Topics  . Alcohol use: No    ALLERGIES:  Allergies  Allergen Reactions  . Morphine Hives    Patient has tolerated oxycodone at high amounts during Nov 2020 hospitalization  . Benzonatate Other (See Comments)     PERTINENT MEDICATIONS:  Outpatient Encounter Medications as of 06/04/2019  Medication Sig  . acetaminophen (TYLENOL) 325 MG tablet Place 2  tablets (650 mg total) into feeding tube every 6 (six) hours as needed for mild pain (or Fever >/= 101).  Marland Kitchen aspirin 81 MG chewable tablet Place 1 tablet (81 mg total) into feeding tube daily.  Marland Kitchen enoxaparin (LOVENOX) 40 MG/0.4ML injection Inject 0.4 mLs (40 mg total) into the skin every 12 (twelve) hours.  . famotidine (PEPCID) 20 MG tablet Place 1 tablet (20 mg total) into feeding tube 2 (two) times daily.  . furosemide (LASIX) 20 MG tablet Place 1 tablet (20 mg total) into feeding tube daily.  . insulin aspart (NOVOLOG) 100 UNIT/ML FlexPen Inject 10 Units into the skin every 6 (six) hours.  . Insulin Glargine (LANTUS) 100 UNIT/ML Solostar Pen Inject 40 Units into the skin 2 (two) times daily.  . Insulin Pen Needle 29G X MISC 1 Device by Does not apply route 2 (two) times daily.  Marland Kitchen levETIRAcetam (KEPPRA) 100 MG/ML solution Place 10 mLs (1,000 mg total) into feeding tube 2 (two) times daily.  Marland Kitchen loperamide (IMODIUM) 2 MG capsule Take 2 capsules (4 mg total) by mouth every 8 (eight) hours as needed for diarrhea or loose stools.  . magnesium oxide (MAG-OX) 400 MG tablet Place 800 mg into feeding tube daily.  . metoprolol tartrate (LOPRESSOR) 25 MG tablet Place 1 tablet (25 mg total) into feeding tube 3 (three) times daily.  . nutrition supplement, JUVEN, (JUVEN) PACK Place 1 packet into feeding tube 2 (two) times daily between meals.  . Nutritional Supplements (FEEDING SUPPLEMENT, GLUCERNA 1.5 CAL,) LIQD Place 1,000 mLs into feeding tube continuous.  . Water For Irrigation, Sterile (FREE WATER) SOLN Place 100 mLs into feeding tube every 4 (four) hours.   No facility-administered encounter medications on file as of 06/04/2019.      Cammy Sanjurjo Marlena Clipper, NP

## 2019-06-05 ENCOUNTER — Other Ambulatory Visit: Payer: Self-pay

## 2019-06-05 ENCOUNTER — Inpatient Hospital Stay
Admission: EM | Admit: 2019-06-05 | Discharge: 2019-06-07 | DRG: 871 | Disposition: A | Discharge status: Home / Self Care | Payer: BC Managed Care – PPO | Attending: Internal Medicine | Admitting: Internal Medicine

## 2019-06-05 ENCOUNTER — Encounter: Payer: Self-pay | Admitting: Internal Medicine

## 2019-06-05 ENCOUNTER — Emergency Department: Payer: BC Managed Care – PPO

## 2019-06-05 DIAGNOSIS — Z978 Presence of other specified devices: Secondary | ICD-10-CM | POA: Diagnosis not present

## 2019-06-05 DIAGNOSIS — Z79899 Other long term (current) drug therapy: Secondary | ICD-10-CM | POA: Diagnosis not present

## 2019-06-05 DIAGNOSIS — Z8249 Family history of ischemic heart disease and other diseases of the circulatory system: Secondary | ICD-10-CM

## 2019-06-05 DIAGNOSIS — G40909 Epilepsy, unspecified, not intractable, without status epilepticus: Secondary | ICD-10-CM

## 2019-06-05 DIAGNOSIS — Z1621 Resistance to vancomycin: Secondary | ICD-10-CM

## 2019-06-05 DIAGNOSIS — R4701 Aphasia: Secondary | ICD-10-CM | POA: Diagnosis not present

## 2019-06-05 DIAGNOSIS — N3090 Cystitis, unspecified without hematuria: Secondary | ICD-10-CM | POA: Diagnosis not present

## 2019-06-05 DIAGNOSIS — I10 Essential (primary) hypertension: Secondary | ICD-10-CM | POA: Diagnosis not present

## 2019-06-05 DIAGNOSIS — R000 Tachycardia, unspecified: Secondary | ICD-10-CM | POA: Diagnosis not present

## 2019-06-05 DIAGNOSIS — D509 Iron deficiency anemia, unspecified: Secondary | ICD-10-CM | POA: Diagnosis present

## 2019-06-05 DIAGNOSIS — E119 Type 2 diabetes mellitus without complications: Secondary | ICD-10-CM

## 2019-06-05 DIAGNOSIS — G4733 Obstructive sleep apnea (adult) (pediatric): Secondary | ICD-10-CM | POA: Diagnosis not present

## 2019-06-05 DIAGNOSIS — G9341 Metabolic encephalopathy: Secondary | ICD-10-CM | POA: Diagnosis not present

## 2019-06-05 DIAGNOSIS — Z8616 Personal history of COVID-19: Secondary | ICD-10-CM

## 2019-06-05 DIAGNOSIS — Z960 Presence of urogenital implants: Secondary | ICD-10-CM | POA: Diagnosis present

## 2019-06-05 DIAGNOSIS — Z888 Allergy status to other drugs, medicaments and biological substances status: Secondary | ICD-10-CM | POA: Diagnosis not present

## 2019-06-05 DIAGNOSIS — T8189XA Other complications of procedures, not elsewhere classified, initial encounter: Secondary | ICD-10-CM | POA: Diagnosis not present

## 2019-06-05 DIAGNOSIS — N390 Urinary tract infection, site not specified: Secondary | ICD-10-CM

## 2019-06-05 DIAGNOSIS — R5381 Other malaise: Secondary | ICD-10-CM | POA: Diagnosis not present

## 2019-06-05 DIAGNOSIS — D649 Anemia, unspecified: Secondary | ICD-10-CM

## 2019-06-05 DIAGNOSIS — R4020 Unspecified coma: Secondary | ICD-10-CM | POA: Diagnosis not present

## 2019-06-05 DIAGNOSIS — Z6841 Body Mass Index (BMI) 40.0 and over, adult: Secondary | ICD-10-CM | POA: Diagnosis not present

## 2019-06-05 DIAGNOSIS — L89154 Pressure ulcer of sacral region, stage 4: Secondary | ICD-10-CM | POA: Diagnosis not present

## 2019-06-05 DIAGNOSIS — Z87440 Personal history of urinary (tract) infections: Secondary | ICD-10-CM | POA: Diagnosis not present

## 2019-06-05 DIAGNOSIS — R339 Retention of urine, unspecified: Secondary | ICD-10-CM

## 2019-06-05 DIAGNOSIS — M2550 Pain in unspecified joint: Secondary | ICD-10-CM | POA: Diagnosis not present

## 2019-06-05 DIAGNOSIS — I69365 Other paralytic syndrome following cerebral infarction, bilateral: Secondary | ICD-10-CM | POA: Diagnosis not present

## 2019-06-05 DIAGNOSIS — R0602 Shortness of breath: Secondary | ICD-10-CM

## 2019-06-05 DIAGNOSIS — Z794 Long term (current) use of insulin: Secondary | ICD-10-CM

## 2019-06-05 DIAGNOSIS — I959 Hypotension, unspecified: Secondary | ICD-10-CM | POA: Diagnosis present

## 2019-06-05 DIAGNOSIS — Z7401 Bed confinement status: Secondary | ICD-10-CM

## 2019-06-05 DIAGNOSIS — A491 Streptococcal infection, unspecified site: Secondary | ICD-10-CM | POA: Diagnosis not present

## 2019-06-05 DIAGNOSIS — R918 Other nonspecific abnormal finding of lung field: Secondary | ICD-10-CM | POA: Diagnosis not present

## 2019-06-05 DIAGNOSIS — Z833 Family history of diabetes mellitus: Secondary | ICD-10-CM

## 2019-06-05 DIAGNOSIS — R6510 Systemic inflammatory response syndrome (SIRS) of non-infectious origin without acute organ dysfunction: Secondary | ICD-10-CM

## 2019-06-05 DIAGNOSIS — I6930 Unspecified sequelae of cerebral infarction: Secondary | ICD-10-CM

## 2019-06-05 DIAGNOSIS — A4181 Sepsis due to Enterococcus: Principal | ICD-10-CM | POA: Diagnosis present

## 2019-06-05 DIAGNOSIS — Z8639 Personal history of other endocrine, nutritional and metabolic disease: Secondary | ICD-10-CM

## 2019-06-05 DIAGNOSIS — Z7982 Long term (current) use of aspirin: Secondary | ICD-10-CM

## 2019-06-05 DIAGNOSIS — T83021A Displacement of indwelling urethral catheter, initial encounter: Secondary | ICD-10-CM | POA: Diagnosis not present

## 2019-06-05 DIAGNOSIS — I469 Cardiac arrest, cause unspecified: Secondary | ICD-10-CM | POA: Diagnosis not present

## 2019-06-05 DIAGNOSIS — Z7901 Long term (current) use of anticoagulants: Secondary | ICD-10-CM

## 2019-06-05 DIAGNOSIS — I69320 Aphasia following cerebral infarction: Secondary | ICD-10-CM | POA: Diagnosis not present

## 2019-06-05 DIAGNOSIS — Z885 Allergy status to narcotic agent status: Secondary | ICD-10-CM

## 2019-06-05 DIAGNOSIS — Z95828 Presence of other vascular implants and grafts: Secondary | ICD-10-CM

## 2019-06-05 DIAGNOSIS — R404 Transient alteration of awareness: Secondary | ICD-10-CM | POA: Diagnosis not present

## 2019-06-05 LAB — GLUCOSE, CAPILLARY: Glucose-Capillary: 131 mg/dL — ABNORMAL HIGH (ref 70–99)

## 2019-06-05 LAB — CBC WITH DIFFERENTIAL/PLATELET
Abs Immature Granulocytes: 0.06 10*3/uL (ref 0.00–0.07)
Basophils Absolute: 0.1 10*3/uL (ref 0.0–0.1)
Basophils Relative: 0 %
Eosinophils Absolute: 0.5 10*3/uL (ref 0.0–0.5)
Eosinophils Relative: 3 %
HCT: 25.7 % — ABNORMAL LOW (ref 39.0–52.0)
Hemoglobin: 7.9 g/dL — ABNORMAL LOW (ref 13.0–17.0)
Immature Granulocytes: 0 %
Lymphocytes Relative: 30 %
Lymphs Abs: 4.2 10*3/uL — ABNORMAL HIGH (ref 0.7–4.0)
MCH: 25.5 pg — ABNORMAL LOW (ref 26.0–34.0)
MCHC: 30.7 g/dL (ref 30.0–36.0)
MCV: 82.9 fL (ref 80.0–100.0)
Monocytes Absolute: 0.8 10*3/uL (ref 0.1–1.0)
Monocytes Relative: 6 %
Neutro Abs: 8.4 10*3/uL — ABNORMAL HIGH (ref 1.7–7.7)
Neutrophils Relative %: 61 %
Platelets: 489 10*3/uL — ABNORMAL HIGH (ref 150–400)
RBC: 3.1 MIL/uL — ABNORMAL LOW (ref 4.22–5.81)
RDW: 15 % (ref 11.5–15.5)
WBC: 14 10*3/uL — ABNORMAL HIGH (ref 4.0–10.5)
nRBC: 0 % (ref 0.0–0.2)

## 2019-06-05 LAB — COMPREHENSIVE METABOLIC PANEL
ALT: 95 U/L — ABNORMAL HIGH (ref 0–44)
AST: 43 U/L — ABNORMAL HIGH (ref 15–41)
Albumin: 2.8 g/dL — ABNORMAL LOW (ref 3.5–5.0)
Alkaline Phosphatase: 53 U/L (ref 38–126)
Anion gap: 12 (ref 5–15)
BUN: 11 mg/dL (ref 6–20)
CO2: 23 mmol/L (ref 22–32)
Calcium: 9.7 mg/dL (ref 8.9–10.3)
Chloride: 101 mmol/L (ref 98–111)
Creatinine, Ser: 0.46 mg/dL — ABNORMAL LOW (ref 0.61–1.24)
GFR calc Af Amer: >60 mL/min (ref >60)
GFR calc non Af Amer: >60 mL/min (ref >60)
Glucose, Bld: 122 mg/dL — ABNORMAL HIGH (ref 70–99)
Potassium: 4.7 mmol/L (ref 3.5–5.1)
Sodium: 136 mmol/L (ref 135–145)
Total Bilirubin: 0.8 mg/dL (ref 0.3–1.2)
Total Protein: 8.3 g/dL — ABNORMAL HIGH (ref 6.5–8.1)

## 2019-06-05 LAB — SARS CORONAVIRUS 2 (TAT 6-24 HRS): SARS Coronavirus 2: NEGATIVE

## 2019-06-05 LAB — URINALYSIS, COMPLETE (UACMP) WITH MICROSCOPIC
Bilirubin Urine: NEGATIVE
Glucose, UA: NEGATIVE mg/dL
Ketones, ur: NEGATIVE mg/dL
Nitrite: NEGATIVE
Protein, ur: 30 mg/dL — AB
RBC / HPF: >50 RBC/hpf — ABNORMAL HIGH (ref 0–5)
Specific Gravity, Urine: 1.014 (ref 1.005–1.030)
Squamous Epithelial / HPF: NONE SEEN (ref 0–5)
WBC, UA: >50 WBC/hpf — ABNORMAL HIGH (ref 0–5)
pH: 7 (ref 5.0–8.0)

## 2019-06-05 LAB — PROTIME-INR
INR: 1.2 (ref 0.8–1.2)
Prothrombin Time: 14.8 seconds (ref 11.4–15.2)

## 2019-06-05 LAB — LACTIC ACID, PLASMA: Lactic Acid, Venous: 1.3 mmol/L (ref 0.5–1.9)

## 2019-06-05 MED ORDER — SODIUM CHLORIDE 0.9% FLUSH
3.0000 mL | Freq: Two times a day (BID) | INTRAVENOUS | Status: DC
  Administered 2019-06-06 – 2019-06-07 (×4): 3 mL via INTRAVENOUS

## 2019-06-05 MED ORDER — METOPROLOL TARTRATE 25 MG PO TABS
25.0000 mg | ORAL_TABLET | Freq: Three times a day (TID) | ORAL | Status: DC
  Administered 2019-06-06 – 2019-06-07 (×5): 25 mg
  Filled 2019-06-05 (×5): qty 1

## 2019-06-05 MED ORDER — VANCOMYCIN HCL IN DEXTROSE 1-5 GM/200ML-% IV SOLN: 1000.0000 mg | Freq: Once | INTRAVENOUS | Status: DC

## 2019-06-05 MED ORDER — SODIUM CHLORIDE 0.9 % IV SOLN: 250.0000 mL | INTRAVENOUS | Status: DC | PRN

## 2019-06-05 MED ORDER — INSULIN ASPART 100 UNIT/ML ~~LOC~~ SOLN
0.0000 [IU] | SUBCUTANEOUS | Status: DC
  Administered 2019-06-06: 2 [IU] via SUBCUTANEOUS
  Administered 2019-06-06: 3 [IU] via SUBCUTANEOUS
  Administered 2019-06-06 (×2): 2 [IU] via SUBCUTANEOUS
  Administered 2019-06-06 – 2019-06-07 (×3): 3 [IU] via SUBCUTANEOUS
  Administered 2019-06-07: 2 [IU] via SUBCUTANEOUS
  Filled 2019-06-05 (×8): qty 1

## 2019-06-05 MED ORDER — ONDANSETRON HCL 4 MG/2ML IJ SOLN: 4.0000 mg | Freq: Four times a day (QID) | INTRAMUSCULAR | Status: DC | PRN

## 2019-06-05 MED ORDER — ACETAMINOPHEN 325 MG PO TABS
650.0000 mg | ORAL_TABLET | Freq: Four times a day (QID) | ORAL | Status: DC | PRN
  Administered 2019-06-06: 650 mg via ORAL
  Filled 2019-06-05: qty 2

## 2019-06-05 MED ORDER — SODIUM CHLORIDE 0.9 % IV SOLN: 2.0000 g | Freq: Once | INTRAVENOUS | Status: DC

## 2019-06-05 MED ORDER — ONDANSETRON HCL 4 MG PO TABS: 4.0000 mg | ORAL_TABLET | Freq: Four times a day (QID) | ORAL | Status: DC | PRN

## 2019-06-05 MED ORDER — SODIUM CHLORIDE 0.9% FLUSH
3.0000 mL | INTRAVENOUS | Status: DC | PRN
  Administered 2019-06-06 (×2): 3 mL via INTRAVENOUS

## 2019-06-05 MED ORDER — SODIUM CHLORIDE 0.9 % IV BOLUS
1000.0000 mL | Freq: Once | INTRAVENOUS | Status: AC
  Administered 2019-06-05: 1000 mL via INTRAVENOUS

## 2019-06-05 MED ORDER — INSULIN GLARGINE 100 UNIT/ML ~~LOC~~ SOLN
40.0000 [IU] | Freq: Two times a day (BID) | SUBCUTANEOUS | Status: DC
  Administered 2019-06-06 – 2019-06-07 (×4): 40 [IU] via SUBCUTANEOUS
  Filled 2019-06-05 (×8): qty 0.4

## 2019-06-05 MED ORDER — LEVETIRACETAM 100 MG/ML PO SOLN
250.0000 mg | Freq: Two times a day (BID) | ORAL | Status: DC
  Administered 2019-06-06 (×3): 250 mg
  Filled 2019-06-05 (×7): qty 2.5

## 2019-06-05 MED ORDER — METOPROLOL TARTRATE 25 MG PO TABS
25.0000 mg | ORAL_TABLET | Freq: Once | ORAL | Status: AC
  Administered 2019-06-05: 25 mg via ORAL
  Filled 2019-06-05: qty 1

## 2019-06-05 MED ORDER — FUROSEMIDE 20 MG PO TABS
20.0000 mg | ORAL_TABLET | Freq: Every day | ORAL | Status: DC
  Administered 2019-06-06 – 2019-06-07 (×2): 20 mg
  Filled 2019-06-05 (×2): qty 1

## 2019-06-05 MED ORDER — ASPIRIN 81 MG PO CHEW
81.0000 mg | CHEWABLE_TABLET | Freq: Every day | ORAL | Status: DC
  Administered 2019-06-06 – 2019-06-07 (×2): 81 mg
  Filled 2019-06-05 (×2): qty 1

## 2019-06-05 MED ORDER — FAMOTIDINE 20 MG PO TABS
20.0000 mg | ORAL_TABLET | Freq: Two times a day (BID) | ORAL | Status: DC
  Administered 2019-06-06 – 2019-06-07 (×4): 20 mg
  Filled 2019-06-05 (×4): qty 1

## 2019-06-05 MED ORDER — ACETAMINOPHEN 650 MG RE SUPP: 650.0000 mg | Freq: Four times a day (QID) | RECTAL | Status: DC | PRN

## 2019-06-05 MED ORDER — ENOXAPARIN SODIUM 40 MG/0.4ML ~~LOC~~ SOLN
40.0000 mg | Freq: Two times a day (BID) | SUBCUTANEOUS | Status: DC
  Administered 2019-06-06 – 2019-06-07 (×4): 40 mg via SUBCUTANEOUS
  Filled 2019-06-05 (×4): qty 0.4

## 2019-06-05 MED ORDER — LINEZOLID 600 MG/300ML IV SOLN
600.0000 mg | Freq: Once | INTRAVENOUS | Status: AC
  Administered 2019-06-05: 600 mg via INTRAVENOUS
  Filled 2019-06-05: qty 300

## 2019-06-05 NOTE — ED Triage Notes (Signed)
Pt arrived via [facility] from home, diagnosed with UTI previously and family wants him evaluated. Pt is non verbal.

## 2019-06-05 NOTE — Progress Notes (Signed)
PHARMACY -  BRIEF ANTIBIOTIC NOTE   Pharmacy has received consult(s) for vanc and cefepime from an ED provider.  The patient's profile has been reviewed for ht/wt/allergies/indication/available labs.    One time order(s) placed for vanc 1 g + cefepime 2 g  Further antibiotics/pharmacy consults should be ordered by admitting physician if indicated.                       Thank you,  Pricilla Riffle, PharmD 06/05/2019  5:50 PM

## 2019-06-05 NOTE — ED Provider Notes (Addendum)
[facility] Emergency Department Provider Note  ____________________________________________   First MD Initiated Contact with Patient 06/05/19 1731     (approximate)  I have reviewed the triage vital signs and the nursing notes.  History  Chief Complaint Cystitis    HPI Paul Hall is a 24 y.o. male with extensive recent medical hx including COVID hospitalization (October 0981), complicated by extensive CVA, now quadriplegic and non-verbal, previously trach and PEG (no longer), chronic indwelling foley, sacral decubitus, who presents for VRE UTI. Patient was treated for UTI last week at home by PCP with course of oral antibiotics. Repeat urine testing done to confirm clearance revealed urine positive for VRE and therefore referred to ED for IV antibiotics and admission.   History obtained from chart review and wife at bedside as he is non-verbal.   Past Medical Hx Past Medical History:  Diagnosis Date  . Diabetes mellitus ([place])   . Hypertension   . Stroke [facility])     Problem List Patient Active Problem List   Diagnosis Date Noted  . Skin ulcer of sacrum with necrosis of muscle ([place])   . Urinary retention   . Type 2 diabetes mellitus without complication, with long-term current use of insulin ([place])   . Tachycardia   . Lower extremity edema   . Acute metabolic encephalopathy   . Obstructive sleep apnea   . Morbid obesity with BMI of 60.0-69.9, adult ([place])   . Goals of care, counseling/discussion   . Palliative care encounter   . Sepsis ([place]) 04/27/2019  . H/O insulin dependent diabetes mellitus 04/27/2019  . History of CVA with residual deficit 04/27/2019  . Seizure disorder ([place]) 04/27/2019  . Decubitus ulcer of sacral region, stage 4 ([place]) 04/27/2019    Past Surgical Hx Past Surgical History:  Procedure Laterality Date  . IVC FILTER PLACEMENT ([facility] HX)    . LEG SURGERY    . PEG TUBE PLACEMENT    . TRACHEOSTOMY       Medications Prior to Admission medications   Medication Sig Start Date End Date Taking? Authorizing Provider  acetaminophen (TYLENOL) 325 MG tablet Place 2 tablets (650 mg total) into feeding tube every 6 (six) hours as needed for mild pain (or Fever >/= 101). 05/11/19   Loletha Grayer, MD  aspirin 81 MG chewable tablet Place 1 tablet (81 mg total) into feeding tube daily. 05/11/19   Loletha Grayer, MD  enoxaparin (LOVENOX) 40 MG/0.4ML injection Inject 0.4 mLs (40 mg total) into the skin every 12 (twelve) hours. 05/11/19   Loletha Grayer, MD  famotidine (PEPCID) 20 MG tablet Place 1 tablet (20 mg total) into feeding tube 2 (two) times daily. 05/11/19   Loletha Grayer, MD  furosemide (LASIX) 20 MG tablet Place 1 tablet (20 mg total) into feeding tube daily. 05/11/19   Wieting, Richard, MD  insulin aspart (NOVOLOG) 100 UNIT/ML FlexPen Inject 10 Units into the skin every 6 (six) hours. 05/11/19   Loletha Grayer, MD  Insulin Glargine (LANTUS) 100 UNIT/ML Solostar Pen Inject 40 Units into the skin 2 (two) times daily. 05/11/19   Loletha Grayer, MD  Insulin Pen Needle 29G X 12MM MISC 1 Device by Does not apply route 2 (two) times daily. 05/11/19   Loletha Grayer, MD  levETIRAcetam (KEPPRA) 100 MG/ML solution Place 10 mLs (1,000 mg total) into feeding tube 2 (two) times daily. 05/11/19   Loletha Grayer, MD  loperamide (IMODIUM) 2 MG capsule Take 2 capsules (4 mg total) by mouth  every 8 (eight) hours as needed for diarrhea or loose stools. 05/11/19   Alford Highland, MD  magnesium oxide (MAG-OX) 400 MG tablet Place 800 mg into feeding tube daily. 04/17/19   [provider]  metoprolol tartrate (LOPRESSOR) 25 MG tablet Place 1 tablet (25 mg total) into feeding tube 3 (three) times daily. 05/11/19   Alford Highland, MD  nutrition supplement, JUVEN, (JUVEN) PACK Place 1 packet into feeding tube 2 (two) times daily between meals. 05/11/19   Alford Highland, MD  Nutritional Supplements (FEEDING  SUPPLEMENT, GLUCERNA 1.5 CAL,) LIQD Place 1,000 mLs into feeding tube continuous. 05/11/19   Alford Highland, MD  Water For Irrigation, Sterile (FREE WATER) SOLN Place 100 mLs into feeding tube every 4 (four) hours. 05/11/19   Alford Highland, MD    Allergies Morphine and Benzonatate  Family Hx No family history on file.  Social Hx Social History   Tobacco Use  . Smoking status: Never Smoker  . Smokeless tobacco: Never Used  Substance Use Topics  . Alcohol use: No  . Drug use: No     Review of Systems Unable to obtain due to patient's clinical status.   Physical Exam  Vital Signs: ED Triage Vitals  Enc Vitals Group     BP 06/05/19 1730 (!) 164/116     Pulse Rate 06/05/19 1721 (!) 136     Resp 06/05/19 1721 (!) 24     Temp 06/05/19 1721 98.1 F (36.7 C)     Temp Source 06/05/19 1721 Oral     SpO2 06/05/19 1730 100 %     Weight 06/05/19 1721 (!) 434 lb 15.5 oz (197.3 kg)     Height 06/05/19 1721 6' (1.829 m)     Head Circumference --      Peak Flow --      Pain Score 06/05/19 1721 0     Pain Loc --      Pain Edu? --      Excl. in GC? --     Constitutional: Awake. Alert. Control and instrumentation engineer. Weight 197 kg. Head: Normocephalic. Atraumatic. Eyes: Conjunctivae clear, sclera anicteric. Nose: No masses or lesions. No congestion. No rhinorrhea. Mouth/Throat: Wearing mask. MM slightly dry. Neck: No stridor. Obese, no appreciable masses. Dressing over prior trach site. Cardiovascular: Tachycardic. Extremities well perfused. Respiratory: Normal respiratory effort.  Lungs CTA, though limited 2/2 body habitus.. Gastrointestinal: Soft. Non-distended. Non-tender.  Genitourinary: Deferred. Musculoskeletal: Mild contracture of RUE. Neurologic:  Non-verbal and hx of quadriplegia, exam consistent with this. Skin: Large malodorous sacral decubitus ulcer. However, per wife at bedside and chart review this is improving compared to prior. Psychiatric: Difficult to assess due to  clinical hx.  EKG  Personally reviewed and interpreted by myself.   Rate: 134 Rhythm: sinus Axis: normal Intervals: WNL Sinus tachycardia, Nonspecific ST/T wave findings No STEMI    Radiology  Personally reviewed imaging myself.   CXR: IMPRESSION:  Low lung volumes accentuating the cardiac size and central  bronchovascular opacities. Borderline cardiomegaly without overt  edema or focal pulmonary opacity    Procedures  Procedure(s) performed (including critical care):  .Critical Care Performed by: Miguel Aschoff., MD Authorized by: Miguel Aschoff., MD   Critical care provider statement:    Critical care time (minutes):  35   Critical care was necessary to treat or prevent imminent or life-threatening deterioration of the following conditions: Potential sepsis secondary to VRE UTI.   Critical care was time spent personally by me on the following activities:  Discussions with consultants, evaluation of patient's response to treatment, examination of patient, ordering and performing treatments and interventions, ordering and review of laboratory studies, ordering and review of radiographic studies, pulse oximetry, re-evaluation of patient's condition, obtaining history from patient or surrogate and review of old charts     Initial Impression / Assessment and Plan / MDM / ED Course  24 y.o. male past medical history as above who presents to the ED for VRE UTI  Patient presents for culture proven VRE UTI, in the setting of indwelling Foley catheter due to his chronic medical problems as above, including COVID related CVA with resultant quadriplegia.  Discussed with pharmacy and ID, who recommended initiating linezolid.  He does have a history of tachycardia, as noted by the wife his lowest HR is typically around 110.  We will give his scheduled dose of metoprolol, as well as fluids, given his infection could also be contributing.  No hypotension or fever at this  time.  Labs reveal leukocytosis to 14, anemia 7.9.  Normal lactic.  Resent UA which continues to be concerning for UTI as we are already aware.  Culture pending.  Discussed with hospitalist for admission.  As part of my medical decision making I have reviewed available labs, radiological tests, reviewed old records, obtained additional history from family (wife at bedside), and discussed with consultants (pharmacy, ID Dr. Sampson Goon).   Final Clinical Impression(s) / ED Diagnosis  Final diagnoses:  VRE infection (vancomycin resistant Enterococcus)  Urinary tract infection in male  Indwelling Foley catheter present       Note:  This document was prepared using Dragon voice recognition software and may include unintentional dictation errors.     Miguel Aschoff., MD 06/05/19 2208

## 2019-06-05 NOTE — H&P (Signed)
Paul Hall ION:[id number] DOB: 1995-10-27 DOA: 06/05/2019     PCP: Samuella Bruin   Outpatient Specialists:   NONE    Patient arrived to ER on 06/05/19 at 1710  Patient coming from: home Lives   With family    Chief Complaint:   Chief Complaint  Patient presents with  . Cystitis    HPI: Paul Hall is a 24 y.o. male with medical history significant of COVID infection 01/2019 resulting CVA resulting in quadriplegia post  status post trach PEG that has been now removed, sacral decubitus, seizures, DM 2, morbid obesity    Presented with low-grade fever family took him to primary care provider and he was treated for UTI by PCP with Keflex blood follow up culture showed VRE at that point he was told to come to emergency department   In October patient was diagnosed with Covid required intubation and ECMO suffered CVA as a result resulting in quadriplegia. Since then he has been progressively making improvements able to come off of his trach and PEG still has indwelling Foley catheter in place. Has a sacral wound has been doing much better. He is starting to move his arms and legs somewhat.  He laughs and make eye contact with the family and tries to mouth some words. Family been staying isolated.  He has not had any cough or shortness of breath. He is able to have diabetic diet at this time. He has chronic tachycardia for which she takes metoprolol 25 3 times a day He is status post IVC filter And on Lovenox prophylaxis as well Patient has history of urinary retention his Foley was changed on 09 May 2019 Family is interested in trying to DC Foley if possible   Patient has been recently admitted in February  Prior to getting Covid patient used to work as a Optician, dispensing  Infectious risk factors:  Reports  fever,     in house  PCR testing  Pending  Lab Results  Component Value Date   SARSCOV2NAA NEGATIVE 04/27/2019    Regarding pertinent Chronic  problems:        HTN on metoprolol    DM 2 - on insulin, 42 units of Lantus twice a day and NovoLog 50 units every 6 hours      Morbid obesity-   BMI Readings from Last 1 Encounters:  06/05/19 58.99 kg/m      OSA -on nocturnal CPAP     Hx of CVA -  With  residual deficits on Aspirin 81 mg,    Seizure disorder on Keppra has been weaning down    While in ER: Patient is nonverbal Unable to provide his own history but family at bedside Tachycardic in the 130s 140s.  Metoprolol was given and IV fluids ordered no evidence of hypotension or fever in emergency department noted to have leukocytosis and anemia of 7.9 Cultures of the urine obtain UA still concerning for UTI  ER Provider Called:   ID   Dr.Fitzerald They Recommend admit to medicine  Start on Linezolid Will see in AM    The following Work up has been ordered so far:  Orders Placed This Encounter  Procedures  . Culture, blood (Routine x 2)  . SARS CORONAVIRUS 2 (TAT 6-24 HRS) Nasopharyngeal Nasopharyngeal Swab  . Urine culture  . DG Chest Port 1 View  . Comprehensive metabolic panel  . Lactic acid, plasma  . CBC with Differential  . Urinalysis, Complete w  Microscopic  . Protime-INR  . Continue foley catheter  . Notify Physician if pt is possible Sepsis patient  . Document height and weight  . Insert / maintain saline lock  . Consult to hospitalist  ALL PATIENTS BEING ADMITTED/HAVING PROCEDURES NEED COVID-19 SCREENING  . EKG 12-Lead    Following Medications were ordered in ER: Medications  linezolid (ZYVOX) IVPB 600 mg (has no administration in time range)  metoprolol tartrate (LOPRESSOR) tablet 25 mg (has no administration in time range)  sodium chloride 0.9 % bolus 1,000 mL (1,000 mLs Intravenous New Bag/Given 06/05/19 1842)        Consult Orders  (From admission, onward)         Start     Ordered   06/05/19 1941  Consult to hospitalist  ALL PATIENTS BEING ADMITTED/HAVING PROCEDURES NEED COVID-19  SCREENING  Once    Comments: ALL PATIENTS BEING ADMITTED/HAVING PROCEDURES NEED COVID-19 SCREENING  Provider:  (Not yet assigned)  Question Answer Comment  Place call to: hospitalist   Reason for Consult Admit   Diagnosis/Clinical Info for Consult: VRE UTI      06/05/19 1941            Significant initial  Findings: Abnormal Labs Reviewed  COMPREHENSIVE METABOLIC PANEL - Abnormal; Notable for the following components:      Result Value   Glucose, Bld 122 (*)    Creatinine, Ser 0.46 (*)    Total Protein 8.3 (*)    Albumin 2.8 (*)    AST 43 (*)    ALT 95 (*)    All other components within normal limits  CBC WITH DIFFERENTIAL/PLATELET - Abnormal; Notable for the following components:   WBC 14.0 (*)    RBC 3.10 (*)    Hemoglobin 7.9 (*)    HCT 25.7 (*)    MCH 25.5 (*)    Platelets 489 (*)    Neutro Abs 8.4 (*)    Lymphs Abs 4.2 (*)    All other components within normal limits  URINALYSIS, COMPLETE (UACMP) WITH MICROSCOPIC - Abnormal; Notable for the following components:   Color, Urine YELLOW (*)    APPearance CLOUDY (*)    Hgb urine dipstick MODERATE (*)    Protein, ur 30 (*)    Leukocytes,Ua LARGE (*)    RBC / HPF >50 (*)    WBC, UA >50 (*)    Bacteria, UA MANY (*)    All other components within normal limits     Otherwise labs showing:    Recent Labs  Lab 06/05/19 1824  NA 136  K 4.7  CO2 23  GLUCOSE 122*  BUN 11  CREATININE 0.46*  CALCIUM 9.7    Cr  stable,   Lab Results  Component Value Date   CREATININE 0.46 (L) 06/05/2019   CREATININE 0.30 (L) 05/09/2019   CREATININE 0.34 (L) 05/08/2019    Recent Labs  Lab 06/05/19 1824  AST 43*  ALT 95*  ALKPHOS 53  BILITOT 0.8  PROT 8.3*  ALBUMIN 2.8*   Lab Results  Component Value Date   CALCIUM 9.7 06/05/2019   PHOS 3.7 05/04/2019     WBC       Component Value Date/Time   WBC 14.0 (H) 06/05/2019 1824   ANC    Component Value Date/Time   NEUTROABS 8.4 (H) 06/05/2019 1824   ALC No  components found for: LYMPHAB    Plt: Lab Results  Component Value Date   PLT 489 (H) 06/05/2019  Lactic Acid, Venous    Component Value Date/Time   LATICACIDVEN 1.3 06/05/2019 1824      COVID-19 Labs  No results for input(s): DDIMER, FERRITIN, LDH, CRP in the last 72 hours.  Lab Results  Component Value Date   SARSCOV2NAA NEGATIVE 04/27/2019       HG/HCT  Down from baseline see below    Component Value Date/Time   HGB 7.9 (L) 06/05/2019 1824   HCT 25.7 (L) 06/05/2019 1824    No results for input(s): LIPASE, AMYLASE in the last 168 hours. No results for input(s): AMMONIA in the last 168 hours.  No components found for: LABALBU    ECG: Ordered Personally reviewed by me showing: HR : 134 Rhythm:  Sinus tachycardia     no evidence of ischemic changes QTC 463   BNP (last 3 results) No results for input(s): BNP in the last 8760 hours.  ProBNP (last 3 results) No results for input(s): PROBNP in the last 8760 hours.  DM  labs:  HbA1C: No results for input(s): HGBA1C in the last 8760 hours.      UA evidence of UTI   Urine analysis:    Component Value Date/Time   COLORURINE YELLOW (A) 06/05/2019 1735   APPEARANCEUR CLOUDY (A) 06/05/2019 1735   LABSPEC 1.014 06/05/2019 1735   PHURINE 7.0 06/05/2019 1735   GLUCOSEU NEGATIVE 06/05/2019 1735   HGBUR MODERATE (A) 06/05/2019 1735   BILIRUBINUR NEGATIVE 06/05/2019 1735   KETONESUR NEGATIVE 06/05/2019 1735   PROTEINUR 30 (A) 06/05/2019 1735   NITRITE NEGATIVE 06/05/2019 1735   LEUKOCYTESUR LARGE (A) 06/05/2019 1735      Ordered   CXR - low lung volumes    ED Triage Vitals  Enc Vitals Group     BP 06/05/19 1730 (!) 164/116     Pulse Rate 06/05/19 1721 (!) 136     Resp 06/05/19 1721 (!) 24     Temp 06/05/19 1721 98.1 F (36.7 C)     Temp Source 06/05/19 1721 Oral     SpO2 06/05/19 1730 100 %     Weight 06/05/19 1721 (!) 434 lb 15.5 oz (197.3 kg)     Height 06/05/19 1721 6' (1.829 m)     Head  Circumference --      Peak Flow --      Pain Score 06/05/19 1721 0     Pain Loc --      Pain Edu? --      Excl. in GC? --   TMAX(24)@       Latest  Blood pressure 123/84, pulse (!) 125, temperature 98.1 F (36.7 C), temperature source Oral, resp. rate 19, height 6' (1.829 m), weight (!) 197.3 kg, SpO2 100 %.    Hospitalist was called for admission for SIRS due to VRE  UTI    Review of Systems:    Pertinent positives include:  Fevers, chills, fatigue, confusion  Constitutional:  No weight loss, night sweats,weight loss  HEENT:  No headaches, Difficulty swallowing,Tooth/dental problems,Sore throat,  No sneezing, itching, ear ache, nasal congestion, post nasal drip,  Cardio-vascular:  No chest pain, Orthopnea, PND, anasarca, dizziness, palpitations.no Bilateral lower extremity swelling  GI:  No heartburn, indigestion, abdominal pain, nausea, vomiting, diarrhea, change in bowel habits, loss of appetite, melena, blood in stool, hematemesis Resp:  no shortness of breath at rest. No dyspnea on exertion, No excess mucus, no productive cough, No non-productive cough, No coughing up of blood.No change in color of mucus.No wheezing.  Skin:  no rash or lesions. No jaundice GU:  no dysuria, change in color of urine, no urgency or frequency. No straining to urinate.  No flank pain.  Musculoskeletal:  No joint pain or no joint swelling. No decreased range of motion. No back pain.  Psych:  No change in mood or affect. No depression or anxiety. No memory loss.  Neuro:    All systems reviewed and apart from HOPI all are negative  Past Medical History:   Past Medical History:  Diagnosis Date  . Diabetes mellitus (HCC)   . Hypertension   . Stroke [facility](HCC)       Past Surgical History:  Procedure Laterality Date  . IVC FILTER PLACEMENT ([facility] HX)    . LEG SURGERY    . PEG TUBE PLACEMENT    . TRACHEOSTOMY      Social History:  Ambulatory  bed bound     reports that he has never  smoked. He has never used smokeless tobacco. He reports that he does not drink alcohol or use drugs.   Family History:   Family History  Problem Relation Age of Onset  . Diabetes Other   . Hypertension Other     Allergies: Allergies  Allergen Reactions  . Morphine Hives    Patient has tolerated oxycodone at high amounts during Nov 2020 hospitalization  . Benzonatate Other (See Comments)     Prior to Admission medications   Medication Sig Start Date End Date Taking? Authorizing Provider  acetaminophen (TYLENOL) 325 MG tablet Place 2 tablets (650 mg total) into feeding tube every 6 (six) hours as needed for mild pain (or Fever >/= 101). 05/11/19   Alford HighlandWieting, Richard, MD  aspirin 81 MG chewable tablet Place 1 tablet (81 mg total) into feeding tube daily. 05/11/19   Alford HighlandWieting, Richard, MD  enoxaparin (LOVENOX) 40 MG/0.4ML injection Inject 0.4 mLs (40 mg total) into the skin every 12 (twelve) hours. 05/11/19   Alford HighlandWieting, Richard, MD  famotidine (PEPCID) 20 MG tablet Place 1 tablet (20 mg total) into feeding tube 2 (two) times daily. 05/11/19   Alford HighlandWieting, Richard, MD  furosemide (LASIX) 20 MG tablet Place 1 tablet (20 mg total) into feeding tube daily. 05/11/19   Wieting, Richard, MD  insulin aspart (NOVOLOG) 100 UNIT/ML FlexPen Inject 10 Units into the skin every 6 (six) hours. 05/11/19   Alford HighlandWieting, Richard, MD  Insulin Glargine (LANTUS) 100 UNIT/ML Solostar Pen Inject 40 Units into the skin 2 (two) times daily. 05/11/19   Alford HighlandWieting, Richard, MD  Insulin Pen Needle 29G X 12MM MISC 1 Device by Does not apply route 2 (two) times daily. 05/11/19   Alford HighlandWieting, Richard, MD  levETIRAcetam (KEPPRA) 100 MG/ML solution Place 10 mLs (1,000 mg total) into feeding tube 2 (two) times daily. 05/11/19   Alford HighlandWieting, Richard, MD  loperamide (IMODIUM) 2 MG capsule Take 2 capsules (4 mg total) by mouth every 8 (eight) hours as needed for diarrhea or loose stools. 05/11/19   Alford HighlandWieting, Richard, MD  magnesium oxide (MAG-OX) 400 MG tablet Place 800 mg  into feeding tube daily. 04/17/19   [provider]  metoprolol tartrate (LOPRESSOR) 25 MG tablet Place 1 tablet (25 mg total) into feeding tube 3 (three) times daily. 05/11/19   Alford HighlandWieting, Richard, MD  nutrition supplement, JUVEN, (JUVEN) PACK Place 1 packet into feeding tube 2 (two) times daily between meals. 05/11/19   Alford HighlandWieting, Richard, MD  Nutritional Supplements (FEEDING SUPPLEMENT, GLUCERNA 1.5 CAL,) LIQD Place 1,000 mLs into feeding tube continuous.  05/11/19   Loletha Grayer, MD  Water For Irrigation, Sterile (FREE WATER) SOLN Place 100 mLs into feeding tube every 4 (four) hours. 05/11/19   Loletha Grayer, MD   Physical Exam: Blood pressure 123/84, pulse (!) 125, temperature 98.1 F (36.7 C), temperature source Oral, resp. rate 19, height 6' (1.829 m), weight (!) 197.3 kg, SpO2 100 %. 1. General:  in No  Acute distress   Chronically ill  -appearing 2. Psychological: Alert nonverbal not following any commands 3. Head/ENT:    Dry Mucous Membranes                          Head Non traumatic, neck supple                          Poor Dentition 4. SKIN:  decreased Skin turgor,  Skin clean Dry and intact no rash 5. Heart: Regular rate and rhythm no Murmur, no Rub or gallop 6. Lungs:   no wheezes or crackles   7. Abdomen: Soft,  non-tender, Non distended   Obese bowel sounds present 8. Lower extremities: no clubbing, cyanosis trace edema 9. Neurologically quadriplegic not following commands 10. MSK: Normal range of motion   All other LABS:     Recent Labs  Lab 06/05/19 1824  WBC 14.0*  NEUTROABS 8.4*  HGB 7.9*  HCT 25.7*  MCV 82.9  PLT 489*     Recent Labs  Lab 06/05/19 1824  NA 136  K 4.7  CL 101  CO2 23  GLUCOSE 122*  BUN 11  CREATININE 0.46*  CALCIUM 9.7     Recent Labs  Lab 06/05/19 1824  AST 43*  ALT 95*  ALKPHOS 53  BILITOT 0.8  PROT 8.3*  ALBUMIN 2.8*       Cultures:    Component Value Date/Time   SDES BLOOD LEFT ANTECUBITAL 04/27/2019 1243     SDES  04/27/2019 1243    BLOOD BLOOD RIGHT FOREARM Performed at [facility], [address], [place], [place] [address]    SDES  04/27/2019 1243    IN/OUT CATH URINE Performed at [facility], [address] [place], [place] [address]    [facility]  04/27/2019 1243    BOTTLES DRAWN AEROBIC AND ANAEROBIC Blood Culture results may not be optimal due to an inadequate volume of blood received in culture bottles   SPECREQUEST  04/27/2019 1243    BOTTLES DRAWN AEROBIC AND ANAEROBIC Blood Culture adequate volume Performed at [facility], [address] [place], [place] [address]    [facility]  04/27/2019 1243    NONE Performed at [facility], [address], [place], [place] [address]    CULT  04/27/2019 1243    NO GROWTH 5 DAYS Performed at [facility], [address] [place], [place] [address]    CULT (A) 04/27/2019 1243    DIPHTHEROIDS(CORYNEBACTERIUM SPECIES) Standardized susceptibility testing for this organism is not available. Performed at [facility], [place] [address] [place], [place] [address]    CULT MULTIPLE SPECIES PRESENT, SUGGEST RECOLLECTION (A) 04/27/2019 1243   REPTSTATUS 05/02/2019 FINAL 04/27/2019 1243   REPTSTATUS 05/01/2019 FINAL 04/27/2019 1243   REPTSTATUS 04/29/2019 FINAL 04/27/2019 1243     Radiological Exams on Admission: DG Chest Port 1 View  Result Date: 06/05/2019 CLINICAL DATA:  UTI history of hypertension EXAM: PORTABLE CHEST 1 VIEW COMPARISON:  04/29/2019 FINDINGS: Tracheostomy tube  is not visualized. Low lung volumes. Borderline to mild cardiomegaly. No consolidation, pleural effusion or pneumothorax. IMPRESSION: Low lung volumes accentuating the cardiac size and central bronchovascular opacities. Borderline cardiomegaly without overt edema or focal pulmonary opacity Electronically Signed   By: Jasmine Pang M.D.   On: 06/05/2019 18:08    Chart has been reviewed    Assessment/Plan  24 y.o. male with medical history significant of COVID infection 01/2019 resulting CVA resulting in quadriplegia post  status post trach PEG that has been now removed, sacral decubitus, seizures, DM 2, morbid obesity  Admitted for VRE UTI  Present on Admission: . VRE (vancomycin-resistant Enterococci) infection -isolated in urine ER provider discussed with ID who will see patient in consult continue linezolid   . SIRS (systemic inflammatory response syndrome) (HCC) most likely secondary to UTI continue to follow IV antibiotics rehydrate and follow-up cultures   . Decubitus ulcer of sacral region, stage 4 (HCC) -order wound consult patient family states the wound has been improving   . Acute metabolic encephalopathy-in the setting of history of CVA in the setting of severe life-threatening Covid infection.  . Obstructive sleep apnea-resume CPAP if Covid negative   . Acute lower UTI - Treated with linezolid await results of urine culture   . Urinary retention-family is interested in discontinuing Foley catheter prior to discharge could attempt a trial Foley discontinuation once acute illness improves  History of seizure disorder-continue Keppra 250 mg twice daily Dm 2-  - Order   Moderate SSI   -     Lantus 40 units bid,  -  check TSH and HgA1C Order diabetic coordinator consult  Morbid obesity will need nutritional consult as an outpatient for follow-up We will order bariatric bed  Anemia unclear etiology likely anemia of chronic disease patient is on chronic anticoagulation secondary to being bedbound.  And obtain anemia panel and Hemoccult stool transfuse as needed for hemoglobin below 7 or if evidence of precipitous drop  History of quadriplegia status post CVA.  Patient has been making some improvement continue PT OT  Other plan as per orders.  DVT prophylaxis:   Lovenox     Code Status:  FULL CODE as per  family  I had personally discussed CODE STATUS with  patient    Family Communication:   Family  at  Bedside  plan of care was discussed   With  Wife,   Disposition Plan:       To home once workup is complete and patient is stable                      Would benefit from PT/OT eval prior to DC  Ordered                                Patient care consulted                   Nutrition    consulted                  Wound care  consulted                                      Consults called:  ID  Admission status:  ED Disposition    None     Obs  Level of care        SDU tele indefinitely please discontinue once patient no longer qualifies   Precautions: admitted as  asymptomatic screening protocol     PPE: Used by the provider:   P100  eye Goggles,  Gloves    Yannis Gumbs 06/05/2019, 10:50 PM    [facility]     after 2 AM please page floor coverage PA If 7AM-7PM, please contact the day team taking care of the patient using [email or web address]   Patient was evaluated in the context of the global COVID-19 pandemic, which necessitated consideration that the patient might be at risk for infection with the SARS-CoV-2 virus that causes COVID-19. Institutional protocols and algorithms that pertain to the evaluation of patients at risk for COVID-19 are in a state of rapid change based on information released by regulatory bodies including the CDC and federal and state organizations. These policies and algorithms were followed during the patient's care.

## 2019-06-06 DIAGNOSIS — G4733 Obstructive sleep apnea (adult) (pediatric): Secondary | ICD-10-CM | POA: Diagnosis present

## 2019-06-06 DIAGNOSIS — R339 Retention of urine, unspecified: Secondary | ICD-10-CM | POA: Diagnosis present

## 2019-06-06 DIAGNOSIS — I69320 Aphasia following cerebral infarction: Secondary | ICD-10-CM | POA: Diagnosis not present

## 2019-06-06 DIAGNOSIS — Z888 Allergy status to other drugs, medicaments and biological substances status: Secondary | ICD-10-CM | POA: Diagnosis not present

## 2019-06-06 DIAGNOSIS — A4181 Sepsis due to Enterococcus: Secondary | ICD-10-CM | POA: Diagnosis present

## 2019-06-06 DIAGNOSIS — I10 Essential (primary) hypertension: Secondary | ICD-10-CM | POA: Diagnosis present

## 2019-06-06 DIAGNOSIS — Z960 Presence of urogenital implants: Secondary | ICD-10-CM | POA: Diagnosis present

## 2019-06-06 DIAGNOSIS — Z885 Allergy status to narcotic agent status: Secondary | ICD-10-CM | POA: Diagnosis not present

## 2019-06-06 DIAGNOSIS — N3090 Cystitis, unspecified without hematuria: Secondary | ICD-10-CM | POA: Diagnosis present

## 2019-06-06 DIAGNOSIS — D509 Iron deficiency anemia, unspecified: Secondary | ICD-10-CM | POA: Diagnosis present

## 2019-06-06 DIAGNOSIS — L89154 Pressure ulcer of sacral region, stage 4: Secondary | ICD-10-CM | POA: Diagnosis present

## 2019-06-06 DIAGNOSIS — I69365 Other paralytic syndrome following cerebral infarction, bilateral: Secondary | ICD-10-CM | POA: Diagnosis not present

## 2019-06-06 DIAGNOSIS — Z79899 Other long term (current) drug therapy: Secondary | ICD-10-CM | POA: Diagnosis not present

## 2019-06-06 DIAGNOSIS — N390 Urinary tract infection, site not specified: Secondary | ICD-10-CM | POA: Diagnosis not present

## 2019-06-06 DIAGNOSIS — Z1621 Resistance to vancomycin: Secondary | ICD-10-CM | POA: Diagnosis present

## 2019-06-06 DIAGNOSIS — I959 Hypotension, unspecified: Secondary | ICD-10-CM | POA: Diagnosis present

## 2019-06-06 DIAGNOSIS — E119 Type 2 diabetes mellitus without complications: Secondary | ICD-10-CM | POA: Diagnosis present

## 2019-06-06 DIAGNOSIS — G9341 Metabolic encephalopathy: Secondary | ICD-10-CM | POA: Diagnosis present

## 2019-06-06 DIAGNOSIS — A491 Streptococcal infection, unspecified site: Secondary | ICD-10-CM | POA: Diagnosis not present

## 2019-06-06 DIAGNOSIS — R4701 Aphasia: Secondary | ICD-10-CM | POA: Diagnosis present

## 2019-06-06 DIAGNOSIS — Z8616 Personal history of COVID-19: Secondary | ICD-10-CM | POA: Diagnosis not present

## 2019-06-06 DIAGNOSIS — D649 Anemia, unspecified: Secondary | ICD-10-CM | POA: Diagnosis not present

## 2019-06-06 DIAGNOSIS — Z95828 Presence of other vascular implants and grafts: Secondary | ICD-10-CM | POA: Diagnosis not present

## 2019-06-06 DIAGNOSIS — Z6841 Body Mass Index (BMI) 40.0 and over, adult: Secondary | ICD-10-CM | POA: Diagnosis not present

## 2019-06-06 DIAGNOSIS — Z794 Long term (current) use of insulin: Secondary | ICD-10-CM | POA: Diagnosis not present

## 2019-06-06 DIAGNOSIS — G40909 Epilepsy, unspecified, not intractable, without status epilepticus: Secondary | ICD-10-CM | POA: Diagnosis present

## 2019-06-06 LAB — CBC
HCT: 30.6 % — ABNORMAL LOW (ref 39.0–52.0)
Hemoglobin: 9.4 g/dL — ABNORMAL LOW (ref 13.0–17.0)
MCH: 25.3 pg — ABNORMAL LOW (ref 26.0–34.0)
MCHC: 30.7 g/dL (ref 30.0–36.0)
MCV: 82.3 fL (ref 80.0–100.0)
Platelets: 452 10*3/uL — ABNORMAL HIGH (ref 150–400)
RBC: 3.72 MIL/uL — ABNORMAL LOW (ref 4.22–5.81)
RDW: 14.7 % (ref 11.5–15.5)
WBC: 12.2 10*3/uL — ABNORMAL HIGH (ref 4.0–10.5)
nRBC: 0 % (ref 0.0–0.2)

## 2019-06-06 LAB — TYPE AND SCREEN
ABO/RH(D): AB POS
Antibody Screen: NEGATIVE

## 2019-06-06 LAB — GLUCOSE, CAPILLARY
Glucose-Capillary: 130 mg/dL — ABNORMAL HIGH (ref 70–99)
Glucose-Capillary: 131 mg/dL — ABNORMAL HIGH (ref 70–99)
Glucose-Capillary: 122 mg/dL — ABNORMAL HIGH (ref 70–99)
Glucose-Capillary: 168 mg/dL — ABNORMAL HIGH (ref 70–99)
Glucose-Capillary: 157 mg/dL — ABNORMAL HIGH (ref 70–99)
Glucose-Capillary: 160 mg/dL — ABNORMAL HIGH (ref 70–99)
Glucose-Capillary: 155 mg/dL — ABNORMAL HIGH (ref 70–99)

## 2019-06-06 LAB — COMPREHENSIVE METABOLIC PANEL
ALT: 94 U/L — ABNORMAL HIGH (ref 0–44)
AST: 34 U/L (ref 15–41)
Albumin: 2.7 g/dL — ABNORMAL LOW (ref 3.5–5.0)
Alkaline Phosphatase: 48 U/L (ref 38–126)
Anion gap: 10 (ref 5–15)
BUN: 8 mg/dL (ref 6–20)
CO2: 24 mmol/L (ref 22–32)
Calcium: 9.8 mg/dL (ref 8.9–10.3)
Chloride: 102 mmol/L (ref 98–111)
Creatinine, Ser: 0.36 mg/dL — ABNORMAL LOW (ref 0.61–1.24)
GFR calc Af Amer: >60 mL/min (ref >60)
GFR calc non Af Amer: >60 mL/min (ref >60)
Glucose, Bld: 149 mg/dL — ABNORMAL HIGH (ref 70–99)
Potassium: 4 mmol/L (ref 3.5–5.1)
Sodium: 136 mmol/L (ref 135–145)
Total Bilirubin: 0.7 mg/dL (ref 0.3–1.2)
Total Protein: 7.8 g/dL (ref 6.5–8.1)

## 2019-06-06 LAB — IRON AND TIBC
Iron: 21 ug/dL — ABNORMAL LOW (ref 45–182)
Saturation Ratios: 8 % — ABNORMAL LOW (ref 17.9–39.5)
TIBC: 249 ug/dL — ABNORMAL LOW (ref 250–450)
UIBC: 228 ug/dL

## 2019-06-06 LAB — RETICULOCYTES
Immature Retic Fract: 37 % — ABNORMAL HIGH (ref 2.3–15.9)
RBC.: 3.73 MIL/uL — ABNORMAL LOW (ref 4.22–5.81)
Retic Count, Absolute: 89.5 10*3/uL (ref 19.0–186.0)
Retic Ct Pct: 2.4 % (ref 0.4–3.1)

## 2019-06-06 LAB — TSH: TSH: 2.72 u[IU]/mL (ref 0.350–4.500)

## 2019-06-06 LAB — LACTIC ACID, PLASMA: Lactic Acid, Venous: 1.3 mmol/L (ref 0.5–1.9)

## 2019-06-06 LAB — MAGNESIUM: Magnesium: 1.7 mg/dL (ref 1.7–2.4)

## 2019-06-06 LAB — URINE CULTURE

## 2019-06-06 LAB — HEMOGLOBIN A1C
Hgb A1c MFr Bld: 8.3 % — ABNORMAL HIGH (ref 4.8–5.6)
Mean Plasma Glucose: 191.51 mg/dL

## 2019-06-06 LAB — FERRITIN: Ferritin: 135 ng/mL (ref 24–336)

## 2019-06-06 LAB — FOLATE: Folate: 16.8 ng/mL (ref >5.9)

## 2019-06-06 LAB — VITAMIN B12: Vitamin B-12: 1839 pg/mL — ABNORMAL HIGH (ref 180–914)

## 2019-06-06 LAB — PHOSPHORUS: Phosphorus: 5.2 mg/dL — ABNORMAL HIGH (ref 2.5–4.6)

## 2019-06-06 MED ORDER — PRO-STAT SUGAR FREE PO LIQD
30.0000 mL | Freq: Two times a day (BID) | ORAL | Status: DC
  Administered 2019-06-06 – 2019-06-07 (×2): 30 mL via ORAL

## 2019-06-06 MED ORDER — LACTATED RINGERS IV BOLUS
500.0000 mL | Freq: Once | INTRAVENOUS | Status: AC
  Administered 2019-06-06: 500 mL via INTRAVENOUS

## 2019-06-06 MED ORDER — ENSURE MAX PROTEIN PO LIQD
11.0000 [oz_av] | Freq: Two times a day (BID) | ORAL | Status: DC
  Administered 2019-06-06 – 2019-06-07 (×3): 11 [oz_av] via ORAL
  Filled 2019-06-06: qty 330

## 2019-06-06 MED ORDER — CHLORHEXIDINE GLUCONATE CLOTH 2 % EX PADS
6.0000 | MEDICATED_PAD | Freq: Every day | CUTANEOUS | Status: DC
  Administered 2019-06-06 – 2019-06-07 (×2): 6 via TOPICAL

## 2019-06-06 MED ORDER — SODIUM CHLORIDE 0.9 % IV SOLN
510.0000 mg | Freq: Once | INTRAVENOUS | Status: AC
  Administered 2019-06-06: 510 mg via INTRAVENOUS
  Filled 2019-06-06: qty 17

## 2019-06-06 MED ORDER — JUVEN PO PACK
1.0000 | PACK | Freq: Two times a day (BID) | ORAL | Status: DC
  Administered 2019-06-06 (×2): 1 via ORAL

## 2019-06-06 MED ORDER — HYDROMORPHONE HCL 1 MG/ML IJ SOLN
0.5000 mg | Freq: Every day | INTRAMUSCULAR | Status: DC | PRN
  Administered 2019-06-06: 0.5 mg via INTRAVENOUS
  Filled 2019-06-06: qty 0.5

## 2019-06-06 MED ORDER — ASCORBIC ACID 500 MG PO TABS
500.0000 mg | ORAL_TABLET | Freq: Two times a day (BID) | ORAL | Status: DC
  Administered 2019-06-06 – 2019-06-07 (×2): 500 mg via ORAL
  Filled 2019-06-06 (×2): qty 1

## 2019-06-06 NOTE — Consult Note (Addendum)
WOC Nurse Consult Note: Reason for Consult: Pt is familiar to [facility] team from a recent admission.  Chronic stage 4 pressure injury had a negatice pressure dressing and Medella machine prior to admission, which is changed Q M/W/F.  Wife has the Medella machine at home and current dressing is intact but not hooked to suction.  Wound type: Stage 4 pressure injury to sacrum; 7X5X4cm with tunneling at 9:00 o'clock to 5.2 cm.  Beefy red with bone palpable with a swab. Slightly macerated skin surrounding the wound from previous drainage. Pt is incontinent of loose stools and it will be difficult to maintain a seal.   Pressure Injury POA: Yes Dressing procedure/placement/frequency: Wound has improved in appearance and decreased in size since previous admission. Applied barrier ring around edges to attempt to maintain a seal, then one piece black foam to cont suction.  Bridged trackpad to flank to reduce pressure.  Pt was medicated for pain prior to the procedure and tolerated with minimal amt discomfort. Bariatric air mattress has been ordered to reduce pressure.   WOC applied Vac dressing on Thurs and will change again on MON and begin Q M/W/F dressings at that time. Discussed plan of care with wife at the bedside; she is well informed regarding dressing changes. When patient is discharged, wife will bring in a track pad and the Medella machine from home and will change the trackpad to the other machine's tubing. Cammie Mcgee MSN, RN, CWOCN, [place], CNS [phone]

## 2019-06-06 NOTE — Progress Notes (Signed)
Initial Nutrition Assessment  DOCUMENTATION CODES:   Morbid obesity  INTERVENTION:   Prostat liquid protein PO 30 ml BID with meals, each supplement provides 100 kcal, 15 grams protein.  Juven Fruit Punch BID, each serving provides 95kcal and 2.5g of protein (amino acids glutamine and arginine)  Vitamin C 500mg  po BID  Ensure Max protein supplement BID, each supplement provides 150kcal and 30g of protein.  NUTRITION DIAGNOSIS:   Increased nutrient needs related to wound healing as evidenced by increased estimated needs.  GOAL:   Patient will meet greater than or equal to 90% of their needs  MONITOR:   PO intake, Supplement acceptance, Labs, Weight trends, Skin, I & O's  REASON FOR ASSESSMENT:   Consult Assessment of nutrition requirement/status  ASSESSMENT:   24 year old male with PMHx of DM, HTN, who was admitted to [facility] on 01/20/2019 with ARDS in setting of COVID-19 requiring ECMO complicated by ischemic stroke and SDH with slight midline shift now with functional quadriplegia s/p tracheostomy tube placement on 02/20/2019 and G-tube placement on 02/21/2019 (now removed) and sacral wound who presents for VRE UTI.   Pt is familiar to nutrition department from recent previous admit. Spoke with pt's wife via phone today. Wife reports that pt has been doing well at home. He has slowly been increasing his oral intake; wife reports that he started out eating baby food and has now progressed to eating regular foods. Pt is being followed by an outpatient Dietitian. Wife reports that pt's PEG tube was removed on 3/1. Family is very diligent about pt's nutrition at home. Pt has been taking vitamin C, zinc and Juven. Pt also drinks 5-7 bottles of water daily. Pt has not been utilizing any protein supplements at home but family has been trying to encourage good protein intake at meals. Discussed with wife proper nutrition and what is needed for wound healing. Recommended continue vitamin C  and juven until wound healing complete. Recommend discontinuing zinc after 30 days as long term zinc and deplete other minerals. Also, recommended pt aim for a goal of 160g of protein for day. Discussed weight loss and wound healing; pt needs to consume enough protein to support wound healing as well as preserve lean muscle. Goal will be to aim for weight loss of fat mass and not muscle. Suggested 55% of pt's daily oral intake come from carbohydrate. Recommended exclude excess amount of fat from pt's diet. Discussed use of protein supplements to insure adequate protein intake. RD will add vitamins and supplements to help pt meet his estimated needs and support wound healing in hospital. Per chart, pt has lost 81lbs(19%) in < 5 months; this is significant.   Medications reviewed and include: aspirin, lovenox, pepcid, lasix, insulin  Labs reviewed: creat 0.36(L), P 5.2(H) Wbc- 12.2(H), Hgb 9.4(L), Hct 30.6(L) cbgs- 130, 131, 122 x 24 hrs AIC 8.3(H)- 3/3  NUTRITION - FOCUSED PHYSICAL EXAM:    Most Recent Value  Orbital Region  No depletion  Upper Arm Region  No depletion  Thoracic and Lumbar Region  No depletion  Buccal Region  No depletion  Temple Region  No depletion  Clavicle Bone Region  No depletion  Clavicle and Acromion Bone Region  No depletion  Scapular Bone Region  No depletion  Dorsal Hand  No depletion  Patellar Region  No depletion  Anterior Thigh Region  No depletion  Posterior Calf Region  No depletion  Edema (RD Assessment)  None  Hair  Reviewed  Eyes  Reviewed  Mouth  Reviewed  Skin  Reviewed  Nails  Reviewed     Diet Order:   Diet Order            Diet Carb Modified Fluid consistency: Thin; Room service appropriate? Yes  Diet effective now             EDUCATION NEEDS:   Not appropriate for education at this time  Skin:  Skin Assessment: Reviewed RN Assessment(Stage 4 pressure injury to sacrum; 7X5X4cm with VAC)  Last BM:  3/3  Height:   Ht Readings  from Last 1 Encounters:  06/05/19 5\' 8"  (1.727 m)    Weight:   Wt Readings from Last 1 Encounters:  06/05/19 (!) 160.9 kg    Ideal Body Weight:  70 kg  BMI:  Body mass index is 53.95 kg/m.  Estimated Nutritional Needs:   Kcal:  2600-2900kcal/day  Protein:  >160g/day  Fluid:  2.5L/day  Koleen Distance MS, RD, LDN Contact information available in Amion

## 2019-06-06 NOTE — Progress Notes (Signed)
PROGRESS NOTE  Paul Hall ZOX:[id number] DOB: 1995/09/23 DOA: 06/05/2019 PCP: Cory Munch, PA-C  Brief History   Paul Hall is a 24 y.o. male with medical history significant of COVID infection 01/2019 resulting CVA resulting in quadriplegia post  status post trach PEG that has been now removed, sacral decubitus ( stage 4), seizures, DM 2 - insulin dependent., super-morbid obesity, peripheral edema, urinary retention, chronic sinus tachycardia, OSA,   Presented with low-grade fever family took him to primary care provider and he was treated for UTI by PCP with Keflex blood follow up culture showed VRE at that point he was told to come to emergency department   In October patient was diagnosed with Covid required intubation and ECMO suffered CVA as a result resulting in quadriplegia.Since then he has been progressively making improvements able to come off of his trach and PEG still has indwelling Foley catheter in place. Has a sacral wound has been doing much better. He is starting to move his arms and legs somewhat.  He laughs and make eye contact with the family and tries to mouth some words. Family been staying isolated. He has not had any cough or shortness of breath. He is able to have diabetic diet at this time. He has chronic tachycardia for which she takes metoprolol 25 3 times a day. Baseline heart rate per spouse is 90-110. Rarely is around 80. Easily as high as 125 - 130 with stimulation. He has an IVC filter and is on Lovenox prophylaxis. Patient has history of urinary retention his Foley was changed on 09 May 2019. Family is interested in trying to DC Foley if possible. The patient has been recently admitted in late January (d/c 05/11/2019)  for Sepsis due to sacral ulcer with necrosis of muscle. The patient has received home health wound care for this. It is doing much better.  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  Consultants   . Infectious disease  Procedures  . None  Antibiotics   Anti-infectives (From admission, onward)   Start     Dose/Rate Route Frequency Ordered Stop   06/05/19 1845  linezolid (ZYVOX) IVPB 600 mg     600 mg 300 mL/hr over 60 Minutes Intravenous  Once 06/05/19 1845 06/05/19 2117   06/05/19 1745  ceFEPIme (MAXIPIME) 2 g in sodium chloride 0.9 % 100 mL IVPB  Status:  Discontinued     2 g 200 mL/hr over 30 Minutes Intravenous  Once 06/05/19 1745 06/05/19 1808   06/05/19 1745  vancomycin (VANCOCIN) IVPB 1000 mg/200 mL premix  Status:  Discontinued     1,000 mg 200 mL/hr over 60 Minutes Intravenous  Once 06/05/19 1745 06/05/19 1808    .  Subjective  The patient is resting comfortably. His wife is at bedside. She states that he is doing much better than last night.  Objective   Vitals:  Vitals:   06/06/19 0826 06/06/19 1202  BP: (!) 109/47 (!) 140/99  Pulse: (!) 117 (!) 110  Resp: 20 16  Temp: 99.4 F (37.4 C) 98.6 F (37 C)  SpO2: 96% 97%    Exam:  Constitutional:  . Appears calm and comfortable. The patient is morbidly obese. He is in no acute distress. Respiratory:  . No increased work of breathing. . Lung sounds are distant. . No wheezes, rales, or rhonchi are auscultated. . No tactile fremitus Cardiovascular:  . RRR, no m/r/g . +1-2 LE extremity edema   .  Normal pedal pulses Abdomen:  . Abdomen is morbidly obese . It is soft, non-tender, non-distended. . No hernias, masses or organomegaly are appreciated due to body habitus . Bowel sounds are distant. Musculoskeletal:  . No cyanosis, clubbing, or edema. Skin:  . No rashes or lesions . Stage IV sacral decubitus ulcer. . palpation of skin: no induration or nodules Neurologic: Unable to evaluate as the patient is unable to cooperate with exam. Psychiatric: Unable to evaluate as the patient is unable to cooperate with exam.  I have seen and examined this patient myself. I have spent 38 minutes in his  evaluation and care.   I have personally reviewed the following:   Today's Data  . Vitals, iron studies, CMP, CBC, Vit B12, Lactic acid  Micro Data  . Blood culture x 2: No growth . Urine culture: No growth  Cardiology Data  . EKG: Sinus tachycardia .  Scheduled Meds: . vitamin C  500 mg Oral BID  . aspirin  81 mg Per Tube Daily  . Chlorhexidine Gluconate Cloth  6 each Topical Daily  . enoxaparin  40 mg Subcutaneous Q12H  . famotidine  20 mg Per Tube BID  . feeding supplement (PRO-STAT SUGAR FREE 64)  30 mL Oral BID BM  . furosemide  20 mg Per Tube Daily  . insulin aspart  0-15 Units Subcutaneous Q4H  . insulin glargine  40 Units Subcutaneous BID  . levETIRAcetam  250 mg Per Tube BID  . metoprolol tartrate  25 mg Per Tube TID  . nutrition supplement (JUVEN)  1 packet Oral BID BM  . Ensure Max Protein  11 oz Oral BID  . sodium chloride flush  3 mL Intravenous Q12H   Continuous Infusions: . sodium chloride    . ferumoxytol      Active Problems:   H/O insulin dependent diabetes mellitus   History of CVA with residual deficit   Seizure disorder (HCC)   Decubitus ulcer of sacral region, stage 4 (HCC)   Acute metabolic encephalopathy   Obstructive sleep apnea   Morbid obesity with BMI of 60.0-69.9, adult (HCC)   Urinary retention   Type 2 diabetes mellitus without complication, with long-term current use of insulin (HCC)   SIRS (systemic inflammatory response syndrome) (HCC)   Acute lower UTI   VRE (vancomycin-resistant Enterococci) infection   Anemia   Sepsis due to vancomycin resistant Enterococcus species (HCC)   LOS: 0 days   A & P  Present on Admission: VRE (vancomycin-resistant Enterococci) infection: Isolated in urine. ER provider discussed with ID who will see patient in consult. Continue linezolid.   SIRS (systemic inflammatory response syndrome) (HCC): Most likely secondary to UTI continue to follow IV antibiotics rehydrate and follow-up cultures. Based  upon tachycardia, leukocytosis, fever, tachypnea, and hypotension upon admission. Leukocytosis is improved, fever has resolved as has tachypnea, and hypotension. Tachycardia is chronic.   Decubitus ulcer of sacral region, stage 4 (HCC): Wound consult requested. Patient's family states the wound has been improving. Monitor.   Acute metabolic encephalopathy: In the setting of history of CVA in the setting of severe life-threatening Covid infection. Baseline mental status since COVID illness and stroke is not clear.   Obstructive sleep apnea: Resume CPAP as patient has tested Covid negative.   Acute lower UTI: VRE isolated. Continue treatment with linezolid await results of urine culture. ID contacted by ED, and will see the patient today.   Urinary retention: F is interested in discontinuing Foley catheter prior to  discharge. Will attempt a trial Foley discontinuation once acute illness improves.  History of seizure disorder: Continue Keppra 250 mg twice daily. Seizure precautions.  Dm II : Pt's glucoses are being followed by FSBS and moderate SSI. He is also being continued on 40 units bid. HgA1C is 8.3. TSH is normal. Diabetic coordinator consult requested.  Super Morbid Obesity: Will need nutritional consult as an outpatient for follow-up We will order bariatric bed. Complicates all cares.  Iron Deficiency Anemia: Feraheme ordered.  Will also check a FOBT. The patient is on chronic anticoagulation for DVT Prophylaxis. Transfuse as needed for hemoglobin below 7 or if evidence of precipitous drop.  History of quadriplegia status post CVA:  Patient has been making some improvement with home health PT.  Continue PT/OT.  I have seen and examined this patient myself. I have spent 35 minutes in his evaluation and care.  DVT prophylaxis:   Lovenox    Code Status:  FULL CODE as per  family. CODE STATUS personally discussed with patient's family by admitting physician. Family Communication:  Wife at bedside.  Disposition Plan:  To home once workup is complete and patient is stable   Paul Ferencz, DO [facility] Direct contact: see [email or web address]  7PM-7AM contact night coverage as above 06/06/2019, 1:49 PM  LOS: 0 days

## 2019-06-06 NOTE — Progress Notes (Signed)
Admission skin assessment completed. Spouse at bedside states home health nurse changes wounc vac dressing every Monday, Wednesday and Friday. Sacral wound is packed with black sponge covered with a mepilex. Spouse reported the measurements from Monday as follows 6.5 x 4.5 x 5.2  There is black sponge noted to right hip - no wound at this site - for bridging only. Old peg site noted to LUQ - dressig clean, dry and intact. Old trach site with dressing  Clean, dry and intact. Skin folds to back moist to touch. Wound consult ordered.

## 2019-06-06 NOTE — Progress Notes (Signed)
Inpatient Diabetes Program Recommendations  AACE/ADA: New Consensus Statement on Inpatient Glycemic Control   Target Ranges:  Prepandial:   less than 140 mg/dL      Peak postprandial:   less than 180 mg/dL (1-2 hours)      Critically ill patients:  140 - 180 mg/dL   Results for ATA, PECHA (MRN [id number]) as of 06/06/2019 08:31  Ref. Range 06/05/2019 23:51 06/06/2019 06:38 06/06/2019 08:05  Glucose-Capillary Latest Ref Range: 70 - 99 mg/dL 017 (H) 494 (H) 496 (H)   Review of Glycemic Control  Diabetes history: DM2 Outpatient Diabetes medications: Lantus 42 units BID, Novolog 50 units Q6H, Glucerna tube feeding Current orders for Inpatient glycemic control: Lantus 40 units BID, Novolog 0-15 units Q4H  Inpatient Diabetes Program Recommendations:   Inpatient DM orders: Agreeable with current inpatient orders for glycemic control.   NOTE: Per H&P, medical history significant of COVID infection 01/2019 resulting CVA resulting in quadriplegia post  status post trach PEG that has been now removed, sacral decubitus, seizures, DM 2, morbid obesity. Noted progress note by A. Garner Nash. NP on 06/04/19 which notes patient is eating well and able to get nutrition orally since PEG tube removed. Spoke with patient' wife over the phone (she is at bedside) and she states that patient has been receiving Lantus 42 units BID and Novolog 50 units Q6H and she notes that glucose has ranged form 80's-156 mg/dl over the past 2 weeks. She reports that patient has been eating food for about 2 weeks and the PEG tube was removed this past Monday.  She reports that patient is eating well and able to eat any type of food consistency now. Discussed current insulin orders and explained that no meal coverage is ordered at this time. Explained that glycemic trends would be followed and if glucose becomes elevated after eating then meal coverage may need to be added. Patient's wife verbalized understanding and states she has no  questions at this time. Will continue to follow along.  Thanks, Orlando Penner, RN, MSN, CDE Diabetes Coordinator Inpatient Diabetes Program [phone] (Team Pager from 8am to 5pm)

## 2019-06-06 NOTE — Progress Notes (Signed)
PT Cancellation Note  Patient Details Name: Shay Bartoli MRN: [id number] DOB: 07/28/1995   Cancelled Treatment:    Reason Eval/Treat Not Completed: Fatigue/lethargy limiting ability to participate(Consult received and chart reviewed.  Patient sleeping soundly, does not awaken/open eyes to voice, pressure or sternal rub at this time.  Will re-attempt at later time/date as patient more alert; number left with primary RN to attempt coordination of PT evaluation with spontaneous awake spell.)  Of note, per wife at bedside and mother on phone, patient home with 24/7 caregivers (family and private-duty nursing).  Bed-bound and total care for all ADLs and repositioning efforts.  Has hospital bed with gel mattress in home and hoyer lift; denies use of hoyer lift or any OOB attempts at this time.  On previous admission, patient with trach and PEG; both now removed.  Family reports some spontaneous movement of all extremities ("especially this R UE, L LE") and family does feel like he acknowledges and interacts with them during awake periods.  Family working diligently to engage patient as able (holding utensils, some passive ROM); remains hopeful for functional recovery.  Will work to re-assess as appropriate to fully discern level of alertness, attention to task and purposeful movement/participation with skilled interventions.   Raevon Broom H. Manson Passey, PT, DPT, NCS 06/06/19, 11:46 AM [phone]

## 2019-06-06 NOTE — Progress Notes (Signed)
Patient is currently followed by [facility] community Palliative program at home. TOC Charlynn Court aware. Thank you. Dayna Barker BSN, RN, [facility] Liaison [facility] [phone]

## 2019-06-06 NOTE — Consult Note (Signed)
Infectious Disease     Reason for Consult:VRE UTI    Referring Physician: Dr Benny Lennert Date of Admission:  06/05/2019   Active Problems:   H/O insulin dependent diabetes mellitus   History of CVA with residual deficit   Seizure disorder ([place])   Decubitus ulcer of sacral region, stage 4 ([place])   Acute metabolic encephalopathy   Obstructive sleep apnea   Morbid obesity with BMI of 60.0-69.9, adult ([place])   Urinary retention   Type 2 diabetes mellitus without complication, with long-term current use of insulin (HCC)   SIRS (systemic inflammatory response syndrome) (HCC)   Acute lower UTI   VRE (vancomycin-resistant Enterococci) infection   Anemia   Sepsis due to vancomycin resistant Enterococcus species (HCC)   HPI: Paul Hall is a 24 y.o. 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 Sent in from PCP due to urine cx growing VRE. His wife is at bedside. Says had cloudy urine mid Feb so had foley removed, but then replaced 2 days later. He was at one point treated with keflex. However ucx was collected and grew > 100 K VRE. On admit, he has low grade fevers, HD instability, WBC 12, UA > 50 K wbc and rbc. Renal fxn nml   Past Medical History:  Diagnosis Date  . Diabetes mellitus ([place])   . Hypertension   . Stroke [facility])    Past Surgical History:  Procedure Laterality Date  . IVC FILTER PLACEMENT ([facility] HX)    . LEG SURGERY    . PEG TUBE PLACEMENT    . TRACHEOSTOMY     Social History   Tobacco Use  . Smoking status: Never Smoker  . Smokeless tobacco: Never Used  Substance Use Topics  . Alcohol use: No  . Drug use: No   Family History  Problem Relation Age of Onset  . Diabetes Other   . Hypertension Other     Allergies:  Allergies  Allergen Reactions  . Morphine Hives    Patient has tolerated oxycodone at high amounts during Nov 2020  hospitalization  . Benzonatate Other (See Comments)    Current antibiotics: Antibiotics Given (last 72 hours)    Date/Time Action Medication Dose Rate   06/05/19 2012 New Bag/Given   linezolid (ZYVOX) IVPB 600 mg 600 mg 300 mL/hr      MEDICATIONS: . vitamin C  500 mg Oral BID  . aspirin  81 mg Per Tube Daily  . Chlorhexidine Gluconate Cloth  6 each Topical Daily  . enoxaparin  40 mg Subcutaneous Q12H  . famotidine  20 mg Per Tube BID  . feeding supplement (PRO-STAT SUGAR FREE 64)  30 mL Oral BID BM  . furosemide  20 mg Per Tube Daily  . insulin aspart  0-15 Units Subcutaneous Q4H  . insulin glargine  40 Units Subcutaneous BID  . levETIRAcetam  250 mg Per Tube BID  . metoprolol tartrate  25 mg Per Tube TID  . nutrition supplement (JUVEN)  1 packet Oral BID BM  . Ensure Max Protein  11 oz Oral BID  . sodium chloride flush  3 mL Intravenous Q12H    Review of Systems - 11 systems reviewed and negative per HPI   OBJECTIVE: Temp:  [98.1 F (36.7 C)-100.2 F (37.9 C)] 98.6 F (37 C) (03/04 1202) Pulse Rate:  [110-138] 110 (03/04 1202) Resp:  [16-25] 16 (  03/04 1202) BP: (104-164)/(47-116) 140/99 (03/04 1202) SpO2:  [96 %-100 %] 97 % (03/04 1202) Weight:  [160.9 kg-197.3 kg] 160.9 kg (03/03 2247) Physical Exam  Constitutional: morbidly obese, min communicative but eyes open, HENT: anicteric Mouth/Throat: Oropharynx is clear and moist. No oropharyngeal exudate.  Cardiovascular: Normal rate, regular rhythm and normal heart sounds. .  Pulmonary/Chest: Effort normal and breath sounds normal. No respiratory distress. He has no wheezes.  Abdominal: Obese Soft. Bowel sounds are normal. He exhibits no distension. There is no tenderness.  Lymphadenopathy: He has no cervical adenopathy.  Neurological: min communicative but eyes open, Skin:sacral wound covered GU- chronic foley in place. Clear urine. Buried penis   LABS: Results for orders placed or performed during the hospital  encounter of 06/05/19 (from the past 48 hour(s))  Urinalysis, Complete w Microscopic     Status: Abnormal   Collection Time: 06/05/19  5:35 PM  Result Value Ref Range   Color, Urine YELLOW (A) YELLOW   APPearance CLOUDY (A) CLEAR   Specific Gravity, Urine 1.014 1.005 - 1.030   pH 7.0 5.0 - 8.0   Glucose, UA NEGATIVE NEGATIVE mg/dL   Hgb urine dipstick MODERATE (A) NEGATIVE   Bilirubin Urine NEGATIVE NEGATIVE   Ketones, ur NEGATIVE NEGATIVE mg/dL   Protein, ur 30 (A) NEGATIVE mg/dL   Nitrite NEGATIVE NEGATIVE   Leukocytes,Ua LARGE (A) NEGATIVE   RBC / HPF >50 (H) 0 - 5 RBC/hpf   WBC, UA >50 (H) 0 - 5 WBC/hpf   Bacteria, UA MANY (A) NONE SEEN   Squamous Epithelial / LPF NONE SEEN 0 - 5   Mucus PRESENT    Ca Oxalate Crys, UA PRESENT     Comment: Performed at [facility], [address] [place], [place] [address]  Comprehensive metabolic panel     Status: Abnormal   Collection Time: 06/05/19  6:24 PM  Result Value Ref Range   Sodium 136 135 - 145 mmol/L   Potassium 4.7 3.5 - 5.1 mmol/L    Comment: HEMOLYSIS AT THIS LEVEL MAY AFFECT RESULT   Chloride 101 98 - 111 mmol/L   CO2 23 22 - 32 mmol/L   Glucose, Bld 122 (H) 70 - 99 mg/dL    Comment: Glucose reference range applies only to samples taken after fasting for at least 8 hours.   BUN 11 6 - 20 mg/dL   Creatinine, Ser 0.46 (L) 0.61 - 1.24 mg/dL   Calcium 9.7 8.9 - 10.3 mg/dL   Total Protein 8.3 (H) 6.5 - 8.1 g/dL   Albumin 2.8 (L) 3.5 - 5.0 g/dL   AST 43 (H) 15 - 41 U/L    Comment: HEMOLYSIS AT THIS LEVEL MAY AFFECT RESULT   ALT 95 (H) 0 - 44 U/L   Alkaline Phosphatase 53 38 - 126 U/L   Total Bilirubin 0.8 0.3 - 1.2 mg/dL    Comment: HEMOLYSIS AT THIS LEVEL MAY AFFECT RESULT   GFR calc non Af Amer >60 >60 mL/min   GFR calc Af Amer >60 >60 mL/min   Anion gap 12 5 - 15    Comment: Performed at [facility], [address] [place], [place] [address]  Lactic acid, plasma     Status: None   Collection  Time: 06/05/19  6:24 PM  Result Value Ref Range   Lactic Acid, Venous 1.3 0.5 - 1.9 mmol/L    Comment: Performed at [facility], [address] [place], [place] [address]  CBC with Differential  Status: Abnormal   Collection Time: 06/05/19  6:24 PM  Result Value Ref Range   WBC 14.0 (H) 4.0 - 10.5 K/uL   RBC 3.10 (L) 4.22 - 5.81 MIL/uL   Hemoglobin 7.9 (L) 13.0 - 17.0 g/dL   HCT 25.7 (L) 39.0 - 52.0 %   MCV 82.9 80.0 - 100.0 fL   MCH 25.5 (L) 26.0 - 34.0 pg   MCHC 30.7 30.0 - 36.0 g/dL   RDW 15.0 11.5 - 15.5 %   Platelets 489 (H) 150 - 400 K/uL   nRBC 0.0 0.0 - 0.2 %   Neutrophils Relative % 61 %   Neutro Abs 8.4 (H) 1.7 - 7.7 K/uL   Lymphocytes Relative 30 %   Lymphs Abs 4.2 (H) 0.7 - 4.0 K/uL   Monocytes Relative 6 %   Monocytes Absolute 0.8 0.1 - 1.0 K/uL   Eosinophils Relative 3 %   Eosinophils Absolute 0.5 0.0 - 0.5 K/uL   Basophils Relative 0 %   Basophils Absolute 0.1 0.0 - 0.1 K/uL   Immature Granulocytes 0 %   Abs Immature Granulocytes 0.06 0.00 - 0.07 K/uL    Comment: Performed at [facility], [address] [place], [place] [address]  Culture, blood (Routine x 2)     Status: None (Preliminary result)   Collection Time: 06/05/19  6:24 PM   Specimen: BLOOD  Result Value Ref Range   Specimen Description BLOOD BLOOD RIGHT ARM    Special Requests      BOTTLES DRAWN AEROBIC AND ANAEROBIC Blood Culture results may not be optimal due to an inadequate volume of blood received in culture bottles   Culture      NO GROWTH < 24 HOURS Performed at [facility], [address] [place], [place] [address]    Report Status PENDING   SARS CORONAVIRUS 2 (TAT 6-24 HRS) Nasopharyngeal Nasopharyngeal Swab     Status: None   Collection Time: 06/05/19  6:25 PM   Specimen: Nasopharyngeal Swab  Result Value Ref Range   SARS Coronavirus 2 NEGATIVE NEGATIVE    Comment: (NOTE) SARS-CoV-2 target nucleic acids are NOT DETECTED. The SARS-CoV-2 RNA is  generally detectable in upper and lower respiratory specimens during the acute phase of infection. Negative results do not preclude SARS-CoV-2 infection, do not rule out co-infections with other pathogens, and should not be used as the sole basis for treatment or other patient management decisions. Negative results must be combined with clinical observations, patient history, and epidemiological information. The expected result is Negative. Fact Sheet for Patients: SugarRoll.be Fact Sheet for Healthcare Providers: https://www.woods-mathews.com/ This test is not yet approved or cleared by the [place] FDA and  has been authorized for detection and/or diagnosis of SARS-CoV-2 by FDA under an Emergency Use Authorization (EUA). This EUA will remain  in effect (meaning this test can be used) for the duration of the COVID-19 declaration under Section 56 4(b)(1) of the Act, 21 U.S.C. section 360bbb-3(b)(1), unless the authorization is terminated or revoked sooner. Performed at [facility], [place] [address], [place], [place] [address]   Culture, blood (Routine x 2)     Status: None (Preliminary result)   Collection Time: 06/05/19  7:22 PM   Specimen: BLOOD  Result Value Ref Range   Specimen Description BLOOD BLOOD RIGHT HAND    Special Requests      BOTTLES DRAWN AEROBIC AND ANAEROBIC Blood Culture adequate volume   Culture      NO GROWTH <  12 HOURS Performed at [facility], [place], [place] [address]    Report Status PENDING   Protime-INR     Status: None   Collection Time: 06/05/19  7:22 PM  Result Value Ref Range   Prothrombin Time 14.8 11.4 - 15.2 seconds   INR 1.2 0.8 - 1.2    Comment: (NOTE) INR goal varies based on device and disease states. Performed at [facility], [address] [place], [place] [address]   Lactic acid, plasma     Status: None   Collection Time: 06/05/19 11:17 PM   Result Value Ref Range   Lactic Acid, Venous 1.3 0.5 - 1.9 mmol/L    Comment: Performed at [facility], [address] [place], [place] [address]  Hemoglobin A1c     Status: Abnormal   Collection Time: 06/05/19 11:17 PM  Result Value Ref Range   Hgb A1c MFr Bld 8.3 (H) 4.8 - 5.6 %    Comment: (NOTE) Pre diabetes:          5.7%-6.4% Diabetes:              >6.4% Glycemic control for   <7.0% adults with diabetes    Mean Plasma Glucose 191.51 mg/dL    Comment: Performed at [place] [address], [place], [place] [address]  Type and screen [place]     Status: None   Collection Time: 06/05/19 11:38 PM  Result Value Ref Range   ABO/RH(D) AB POS    Antibody Screen NEG    Sample Expiration      06/08/2019,2359 Performed at [facility], [address] [place], [place] [address]   Glucose, capillary     Status: Abnormal   Collection Time: 06/05/19 11:51 PM  Result Value Ref Range   Glucose-Capillary 131 (H) 70 - 99 mg/dL    Comment: Glucose reference range applies only to samples taken after fasting for at least 8 hours.   Comment 1 Notify RN   Glucose, capillary     Status: Abnormal   Collection Time: 06/06/19  5:09 AM  Result Value Ref Range   Glucose-Capillary 130 (H) 70 - 99 mg/dL    Comment: Glucose reference range applies only to samples taken after fasting for at least 8 hours.   Comment 1 Notify RN   Magnesium     Status: None   Collection Time: 06/06/19  5:34 AM  Result Value Ref Range   Magnesium 1.7 1.7 - 2.4 mg/dL    Comment: Performed at [facility], [address] [place], [place] [address]  Phosphorus     Status: Abnormal   Collection Time: 06/06/19  5:34 AM  Result Value Ref Range   Phosphorus 5.2 (H) 2.5 - 4.6 mg/dL    Comment: Performed at [facility], [address] [place], [place] [address]  TSH     Status: None   Collection Time: 06/06/19  5:34 AM  Result Value Ref Range    TSH 2.720 0.350 - 4.500 uIU/mL    Comment: Performed by a 3rd Generation assay with a functional sensitivity of <=0.01 uIU/mL. Performed at [facility], [address] [place], [place] [address]   Comprehensive metabolic panel     Status: Abnormal   Collection Time: 06/06/19  5:34 AM  Result Value Ref Range   Sodium 136 135 - 145 mmol/L   Potassium 4.0 3.5 - 5.1 mmol/L   Chloride  102 98 - 111 mmol/L   CO2 24 22 - 32 mmol/L   Glucose, Bld 149 (H) 70 - 99 mg/dL    Comment: Glucose reference range applies only to samples taken after fasting for at least 8 hours.   BUN 8 6 - 20 mg/dL   Creatinine, Ser 0.36 (L) 0.61 - 1.24 mg/dL   Calcium 9.8 8.9 - 10.3 mg/dL   Total Protein 7.8 6.5 - 8.1 g/dL   Albumin 2.7 (L) 3.5 - 5.0 g/dL   AST 34 15 - 41 U/L   ALT 94 (H) 0 - 44 U/L   Alkaline Phosphatase 48 38 - 126 U/L   Total Bilirubin 0.7 0.3 - 1.2 mg/dL   GFR calc non Af Amer >60 >60 mL/min   GFR calc Af Amer >60 >60 mL/min   Anion gap 10 5 - 15    Comment: Performed at [facility], [address] [place], [place] [address]  CBC     Status: Abnormal   Collection Time: 06/06/19  5:34 AM  Result Value Ref Range   WBC 12.2 (H) 4.0 - 10.5 K/uL   RBC 3.72 (L) 4.22 - 5.81 MIL/uL   Hemoglobin 9.4 (L) 13.0 - 17.0 g/dL   HCT 30.6 (L) 39.0 - 52.0 %   MCV 82.3 80.0 - 100.0 fL   MCH 25.3 (L) 26.0 - 34.0 pg   MCHC 30.7 30.0 - 36.0 g/dL   RDW 14.7 11.5 - 15.5 %   Platelets 452 (H) 150 - 400 K/uL   nRBC 0.0 0.0 - 0.2 %    Comment: Performed at [facility], [address] [place], [place] [address]  Vitamin B12     Status: Abnormal   Collection Time: 06/06/19  5:34 AM  Result Value Ref Range   Vitamin B-12 1,839 (H) 180 - 914 pg/mL    Comment: (NOTE) This assay is not validated for testing neonatal or myeloproliferative syndrome specimens for Vitamin B12 levels. Performed at [facility], [place] [address] [place], [place] [address]   Folate     Status:  None   Collection Time: 06/06/19  5:34 AM  Result Value Ref Range   Folate 16.8 >5.9 ng/mL    Comment: Performed at [facility], [address] [place], [place] [address]  Iron and TIBC     Status: Abnormal   Collection Time: 06/06/19  5:34 AM  Result Value Ref Range   Iron 21 (L) 45 - 182 ug/dL   TIBC 249 (L) 250 - 450 ug/dL   Saturation Ratios 8 (L) 17.9 - 39.5 %   UIBC 228 ug/dL    Comment: Performed at [facility], [address], [place], [place] [address]  Ferritin     Status: None   Collection Time: 06/06/19  5:34 AM  Result Value Ref Range   Ferritin 135 24 - 336 ng/mL    Comment: Performed at [facility], [address] [place], [place] [address]  Reticulocytes     Status: Abnormal   Collection Time: 06/06/19  5:34 AM  Result Value Ref Range   Retic Ct Pct 2.4 0.4 - 3.1 %   RBC. 3.73 (L) 4.22 - 5.81 MIL/uL   Retic Count, Absolute 89.5 19.0 - 186.0 K/uL   Immature Retic Fract 37.0 (H) 2.3 - 15.9 %    Comment: Performed at [facility], [address] [place], [place] [address]  Glucose, capillary     Status:  Abnormal   Collection Time: 06/06/19  6:38 AM  Result Value Ref Range   Glucose-Capillary 131 (H) 70 - 99 mg/dL    Comment: Glucose reference range applies only to samples taken after fasting for at least 8 hours.  Glucose, capillary     Status: Abnormal   Collection Time: 06/06/19  8:05 AM  Result Value Ref Range   Glucose-Capillary 122 (H) 70 - 99 mg/dL    Comment: Glucose reference range applies only to samples taken after fasting for at least 8 hours.   Comment 1 Notify RN   Glucose, capillary     Status: Abnormal   Collection Time: 06/06/19 12:00 PM  Result Value Ref Range   Glucose-Capillary 168 (H) 70 - 99 mg/dL    Comment: Glucose reference range applies only to samples taken after fasting for at least 8 hours.   Comment 1 Notify RN    No components found for: ESR, C REACTIVE PROTEIN MICRO: Recent Results  (from the past 720 hour(s))  Culture, blood (Routine x 2)     Status: None (Preliminary result)   Collection Time: 06/05/19  6:24 PM   Specimen: BLOOD  Result Value Ref Range Status   Specimen Description BLOOD BLOOD RIGHT ARM  Final   Special Requests   Final    BOTTLES DRAWN AEROBIC AND ANAEROBIC Blood Culture results may not be optimal due to an inadequate volume of blood received in culture bottles   Culture   Final    NO GROWTH < 24 HOURS Performed at [facility], [address] [place], [place] [address]    Report Status PENDING  Incomplete  SARS CORONAVIRUS 2 (TAT 6-24 HRS) Nasopharyngeal Nasopharyngeal Swab     Status: None   Collection Time: 06/05/19  6:25 PM   Specimen: Nasopharyngeal Swab  Result Value Ref Range Status   SARS Coronavirus 2 NEGATIVE NEGATIVE Final    Comment: (NOTE) SARS-CoV-2 target nucleic acids are NOT DETECTED. The SARS-CoV-2 RNA is generally detectable in upper and lower respiratory specimens during the acute phase of infection. Negative results do not preclude SARS-CoV-2 infection, do not rule out co-infections with other pathogens, and should not be used as the sole basis for treatment or other patient management decisions. Negative results must be combined with clinical observations, patient history, and epidemiological information. The expected result is Negative. Fact Sheet for Patients: SugarRoll.be Fact Sheet for Healthcare Providers: https://www.woods-mathews.com/ This test is not yet approved or cleared by the [place] FDA and  has been authorized for detection and/or diagnosis of SARS-CoV-2 by FDA under an Emergency Use Authorization (EUA). This EUA will remain  in effect (meaning this test can be used) for the duration of the COVID-19 declaration under Section 56 4(b)(1) of the Act, 21 U.S.C. section 360bbb-3(b)(1), unless the authorization is terminated or revoked  sooner. Performed at [facility], [place] [address] [place], [place] [address]   Culture, blood (Routine x 2)     Status: None (Preliminary result)   Collection Time: 06/05/19  7:22 PM   Specimen: BLOOD  Result Value Ref Range Status   Specimen Description BLOOD BLOOD RIGHT HAND  Final   Special Requests   Final    BOTTLES DRAWN AEROBIC AND ANAEROBIC Blood Culture adequate volume   Culture   Final    NO GROWTH < 12 HOURS Performed at [facility], [address] [place], [place] [address]    Report Status PENDING  Incomplete  IMAGING: CT HEAD WO CONTRAST  Result Date: 05/09/2019 CLINICAL DATA:  Altered mental status.  History of COVID and stroke. EXAM: CT HEAD WITHOUT CONTRAST TECHNIQUE: Contiguous axial images were obtained from the base of the skull through the vertex without intravenous contrast. COMPARISON:  CT head 11/11/2012 FINDINGS: Brain: Left cerebral convexity cortical hypodensity with associated cortical calcification extending from the frontal to the parietal lobe. Extensive area of volume loss and low-density compatible with chronic infarction. This is noted on prior reports from [facility]. Probable chronic watershed infarct. Additional small areas of hypodensity in the right frontal white matter consistent with chronic ischemia. Negative for acute infarct. No acute hemorrhage or mass. Ventricle size normal without midline shift. Vascular: Normal arterial flow voids. Skull: Negative Sinuses/Orbits: Mucosal edema paranasal sinuses with air-fluid level left sphenoid sinus. Negative orbit Other: None IMPRESSION: Extensive watershed type infarct over the left cerebral convexity with cortical calcification. This appears chronic. No associated hemorrhage, hydrocephalus, or midline shift. These results were called by telephone at the time of interpretation on 05/09/2019 at 3:12 pm to provider Loletha Grayer , who verbally acknowledged these results. Electronically Signed   By:  Franchot Gallo M.D.   On: 05/09/2019 15:13   DG Chest Port 1 View  Result Date: 06/05/2019 CLINICAL DATA:  UTI history of hypertension EXAM: PORTABLE CHEST 1 VIEW COMPARISON:  04/29/2019 FINDINGS: Tracheostomy tube is not visualized. Low lung volumes. Borderline to mild cardiomegaly. No consolidation, pleural effusion or pneumothorax. IMPRESSION: Low lung volumes accentuating the cardiac size and central bronchovascular opacities. Borderline cardiomegaly without overt edema or focal pulmonary opacity Electronically Signed   By: Donavan Foil M.D.   On: 06/05/2019 18:08    Assessment:   Adron Geisel is a 24 y.o. male with complicated medical history detailed above and in H and P now with chronic foley, sacral decub with VRE on UCX. Low grade temps and mild elevation wbc.   Recommendations Would remove foley and see if he can do without it. His wife is interested in trying this and says at home they have been able to keep him dry and clean. If his wound worsens or cannot manage can reinsert foley at home. Should have PVR checked at home as well to ensure not having urinary retention which would increase risk of UTIs Once foley out would rec 7 days of oral linezolid 600 mg bid. If foley is replaced would not check ua or ucx unless he develops signs of infection including fevers, flank pain, etc. Likely to grow multiple organisms in ucx collected from foley cath in future but would only treat if has active infection or will develop increasing resistance. Thank you very much for allowing me to participate in the care of this patient. Please call with questions.   Cheral Marker. Ola Spurr, MD

## 2019-06-06 NOTE — Evaluation (Signed)
Occupational Therapy Evaluation Patient Details Name: Paul Hall MRN: [id number] DOB: Apr 22, 1995 Today's Date: 06/06/2019    History of Present Illness Samual Hall is a 24 y/o M with medical history significant of COVID infection 01/2019 resulting CVA resulting in quadriplegia, status post trach, and PEG that has been now removed, stage 4 sacral decubitus ulcer (recent adm to [facility] Jan 2021 for assessment of wound), seizures, DM 2 - insulin dependent., super-morbid obesity, peripheral edema, urinary retention, chronic sinus tachycardia, and OSA. Pt presented with low-grade fever and family took him to PCP and he was treated for UTI with Keflex. F/u bld culture showed VRE at that point he was told to come to emergency department.   Clinical Impression   Pt was seen for OT evaluation this date. Prior to hospital admission, pt was requiring TOTAL A with all aspects of mobility and self care from family members. Pt lives with spouse, parents and adult brother in 2 story home (has been transported by EMS in/out for admission/discharge to acute setting. Pt's spouse reports that pt was just starting with a HH aide and was about to have HHPT start prior to this hospital admission (she reports delayed initiation of these services d/t encountering barriers such as companies that could not manage patients with hx: COVID-19). Currently pt demonstrates impairments as described below (See OT problem list) which functionally limit his ability to perform ADL/self-care tasks. Pt's spouse voices questions/concerns r/t positioning of pt, noting that his UEs appear somewhat swollen at this time.  OT facilitates education including rationale and demonstration of recommended positioning for pt's UEs while in bed to reduce risk of edema. Pt's family members could benefit from f/u in acute setting and at home with OT regarding positioning recommendations to ensure reduction of risk of edema and skin breakdown.  Recommend HHOT OT f/u to assess positioning needs and resources in the home environment and to f/u with family education in the natural environment to improve QOL.     Follow Up Recommendations  Supervision/Assistance - 24 hour;Home health OT(HHOT to f/u re: positioning recommendations in home environment)     Equipment Recommendations  Other (comment)(report hacing all necessary equipment at this time.)    Recommendations for Other Services       Precautions / Restrictions Precautions Precautions: Fall Restrictions Weight Bearing Restrictions: No      Mobility Bed Mobility                  Transfers                 General transfer comment: TOTAL A for bed mobility at baseline, not assessed by this author at this time    Balance                                           ADL either performed or assessed with clinical judgement   ADL Overall ADL's : At baseline                                       General ADL Comments: Pt requires total care for ADLs     Vision   Additional Comments: unable to formally assess. Very minimal eye opening (one instance in entire session in which eyes were only fractionally opened) upon assessment.  Perception     Praxis      Pertinent Vitals/Pain Pain Assessment: Faces Faces Pain Scale: Hurts little more Pain Location: with PROM of R UE Pain Descriptors / Indicators: Grimacing Pain Intervention(s): Monitored during session;Repositioned(elevated on two pillows to assist in reducing edema)     Hand Dominance Right   Extremity/Trunk Assessment Upper Extremity Assessment Upper Extremity Assessment: RUE deficits/detail;LUE deficits/detail RUE Deficits / Details: limited tolerance for PROM of R UE, tolerates shoulder flexion to ~70 degrees, but is noted to be somewhat resistive. LUE Deficits / Details: tolerates PROM of L UE better, but shoulder still noted to be somewhat tight,  able to passively move through 120 degrees flexion.           Communication Communication Communication: Expressive difficulties(has apparently not been verbal, but pt's spouse reports that he does say "ouch" sometimes to painful stimuli)   Cognition Arousal/Alertness: Lethargic   Overall Cognitive Status: History of cognitive impairments - at baseline                                 General Comments: Pt is non-verbal throughout session, somewhat responsive to painful stimuli-sternal rub and PROM of R UE ellicit grimace from pt. Pt with very minimal eye opening, does appear to minimally attend when OT on pt's L side. Otherwise not responsive t/o session.   General Comments       Exercises Other Exercises Other Exercises: OT facilitates education with pt's spouse re: positioning considerations to reduce risk of UE edema while lying in bed including elevating UEs and engaging pt in UE PROM regimen, specifically flex/ext of digits/wrists while elevated (education re: PROM regimen previously facilitated over last hospital stay per pt's spouse)   Shoulder Instructions      Home Living Family/patient expects to be discharged to:: Private residence Living Arrangements: Parent;Spouse/significant other;Other relatives(Parents' house with pt's wife and adult brother) Available Help at Discharge: Family Type of Home: House Home Access: Stairs to enter     Home Layout: Two level;Bed/bath upstairs Alternate Level Stairs-Number of Steps: per chart: EMS has been transporting patient to 2nd floor at DC/admission             Home Equipment: Hospital bed   Additional Comments: mechanical lift      Prior Functioning/Environment Level of Independence: Needs assistance  Gait / Transfers Assistance Needed: Pt non ambulatory; Does not transfer, has been bed bound since October 2020. Mechanical lift in home for 2 weeks never used. ADL's / Homemaking Assistance Needed: Total care  since becoming ill in October; Family has been assisting with all care, turning in bed, ADL, etc. Communication / Swallowing Assistance Needed: Pt was previously on tube feed, but is now taking in food PO per spouse Comments: Pt had apparently just started having HH aide come to the home and was supposed to start HHPT just prior to this admission (per spouse, both services delayed in initiation d/t pt with hx COVID and trach).        OT Problem List: Decreased strength;Decreased range of motion;Decreased activity tolerance;Impaired balance (sitting and/or standing);Impaired vision/perception;Decreased coordination;Decreased cognition;Impaired tone;Obesity;Impaired UE functional use;Pain;Increased edema      OT Treatment/Interventions: Patient/family education    OT Goals(Current goals can be found in the care plan section) Acute Rehab OT Goals Patient Stated Goal: to get him more comfortable OT Goal Formulation: With patient/family Time For Goal Achievement: 06/20/19 Potential to Achieve Goals:  Fair  OT Frequency: Min 1X/week   Barriers to D/C: Inaccessible home environment          Co-evaluation              AM-PAC OT "6 Clicks" Daily Activity     Outcome Measure Help from another person eating meals?: Total Help from another person taking care of personal grooming?: Total Help from another person toileting, which includes using toliet, bedpan, or urinal?: Total Help from another person bathing (including washing, rinsing, drying)?: Total Help from another person to put on and taking off regular upper body clothing?: Total Help from another person to put on and taking off regular lower body clothing?: Total 6 Click Score: 6   End of Session    Activity Tolerance:   Patient left: in bed;with family/visitor present(with spouse present, managing call light for pt needs.)  OT Visit Diagnosis: Other symptoms and signs involving the nervous system (R29.898);Other  (comment)(quadriplegia) Hemiplegia - caused by: Cerebral infarction                Time: 2694-8546 OT Time Calculation (min): 18 min Charges:  OT General Charges $OT Visit: 1 Visit OT Evaluation $OT Eval Moderate Complexity: 1 Mod OT Treatments $Self Care/Home Management : 8-22 mins  Gerrianne Scale, MS, OTR/L ascom [phone] 06/06/19, 4:11 PM

## 2019-06-07 DIAGNOSIS — D509 Iron deficiency anemia, unspecified: Secondary | ICD-10-CM

## 2019-06-07 LAB — BASIC METABOLIC PANEL
Anion gap: 4 — ABNORMAL LOW (ref 5–15)
BUN: 12 mg/dL (ref 6–20)
CO2: 29 mmol/L (ref 22–32)
Calcium: 9.8 mg/dL (ref 8.9–10.3)
Chloride: 104 mmol/L (ref 98–111)
Creatinine, Ser: 0.38 mg/dL — ABNORMAL LOW (ref 0.61–1.24)
GFR calc Af Amer: >60 mL/min (ref >60)
GFR calc non Af Amer: >60 mL/min (ref >60)
Glucose, Bld: 151 mg/dL — ABNORMAL HIGH (ref 70–99)
Potassium: 3.9 mmol/L (ref 3.5–5.1)
Sodium: 137 mmol/L (ref 135–145)

## 2019-06-07 LAB — CBC
HCT: 31 % — ABNORMAL LOW (ref 39.0–52.0)
Hemoglobin: 9.3 g/dL — ABNORMAL LOW (ref 13.0–17.0)
MCH: 24.8 pg — ABNORMAL LOW (ref 26.0–34.0)
MCHC: 30 g/dL (ref 30.0–36.0)
MCV: 82.7 fL (ref 80.0–100.0)
Platelets: 449 10*3/uL — ABNORMAL HIGH (ref 150–400)
RBC: 3.75 MIL/uL — ABNORMAL LOW (ref 4.22–5.81)
RDW: 15.1 % (ref 11.5–15.5)
WBC: 11 10*3/uL — ABNORMAL HIGH (ref 4.0–10.5)
nRBC: 0 % (ref 0.0–0.2)

## 2019-06-07 LAB — GLUCOSE, CAPILLARY
Glucose-Capillary: 155 mg/dL — ABNORMAL HIGH (ref 70–99)
Glucose-Capillary: 147 mg/dL — ABNORMAL HIGH (ref 70–99)
Glucose-Capillary: 132 mg/dL — ABNORMAL HIGH (ref 70–99)

## 2019-06-07 MED ORDER — ASCORBIC ACID 500 MG PO TABS: 500.0000 mg | ORAL_TABLET | Freq: Two times a day (BID) | ORAL | 0 refills | Status: AC

## 2019-06-07 MED ORDER — LINEZOLID 600 MG PO TABS: 600.0000 mg | ORAL_TABLET | Freq: Two times a day (BID) | ORAL | 0 refills | Status: DC

## 2019-06-07 NOTE — Progress Notes (Signed)
Paul Hall to be D/C'd Home per MD order.  Discussed prescriptions and follow up appointments with the patient's spouse. Prescriptions given to patient's spouse, medication list explained in detail. Patient's spouse verbalized understanding.  Allergies as of 06/07/2019      Reactions   Morphine Hives   Patient has tolerated oxycodone at high amounts during Nov 2020 hospitalization   Benzonatate Other (See Comments)      Medication List    TAKE these medications   acetaminophen 325 MG tablet Commonly known as: TYLENOL Place 2 tablets (650 mg total) into feeding tube every 6 (six) hours as needed for mild pain (or Fever >/= 101). What changed: Another medication with the same name was removed. Continue taking this medication, and follow the directions you see here.   ascorbic acid 500 MG tablet Commonly known as: VITAMIN C Take 1 tablet (500 mg total) by mouth 2 (two) times daily.   aspirin 81 MG chewable tablet Place 1 tablet (81 mg total) into feeding tube daily.   enoxaparin 40 MG/0.4ML injection Commonly known as: LOVENOX Inject 0.4 mLs (40 mg total) into the skin every 12 (twelve) hours.   famotidine 20 MG tablet Commonly known as: PEPCID Place 1 tablet (20 mg total) into feeding tube 2 (two) times daily.   feeding supplement (GLUCERNA 1.5 CAL) Liqd Place 1,000 mLs into feeding tube continuous.   nutrition supplement (JUVEN) Pack Place 1 packet into feeding tube 2 (two) times daily between meals.   free water Soln Place 100 mLs into feeding tube every 4 (four) hours.   furosemide 20 MG tablet Commonly known as: LASIX Place 1 tablet (20 mg total) into feeding tube daily.   insulin aspart 100 UNIT/ML FlexPen Commonly known as: NOVOLOG Inject 10 Units into the skin every 6 (six) hours. What changed: how much to take   insulin glargine 100 UNIT/ML Solostar Pen Commonly known as: LANTUS Inject 40 Units into the skin 2 (two) times daily. What changed: how  much to take   Insulin Pen Needle 29G X Misc 1 Device by Does not apply route 2 (two) times daily.   levETIRAcetam 100 MG/ML solution Commonly known as: KEPPRA Place 10 mLs (1,000 mg total) into feeding tube 2 (two) times daily. What changed: how much to take   linezolid 600 MG tablet Commonly known as: ZYVOX Take 1 tablet (600 mg total) by mouth 2 (two) times daily.   loperamide 2 MG capsule Commonly known as: IMODIUM Take 2 capsules (4 mg total) by mouth every 8 (eight) hours as needed for diarrhea or loose stools.   magnesium oxide 400 MG tablet Commonly known as: MAG-OX Place 400 mg into feeding tube daily.   metoprolol tartrate 25 MG tablet Commonly known as: LOPRESSOR Place 1 tablet (25 mg total) into feeding tube 3 (three) times daily.            Durable Medical Equipment  (From admission, onward)         Start     Ordered   06/07/19 1313  For home use only DME Other see comment  Once    Comments: Bariatric diapers  Question:  Length of Need  Answer:  Lifetime   06/07/19 1313          Vitals:   06/07/19 1242 06/07/19 1454  BP: 120/83 117/72  Pulse: (!) 108 (!) 111  Resp: 20   Temp: 99.3 F (37.4 C) 99.4 F (37.4 C)  SpO2: 97% 98%  Skin clean, dry and intact without evidence of skin break down, no evidence of skin tears noted. IV catheter discontinued intact. Site without signs and symptoms of complications. Dressing and pressure applied. No complaints noted.  An After Visit Summary was printed and given to the patient's spouse. Patient escorted via stretcher, and D/C home via EMS.  Fuller Mandril, RN

## 2019-06-07 NOTE — Progress Notes (Signed)
Called EMS non-emergent transport. Per EMS patient is third on the list. Fire Department was alerted as well to assist with patient transport into home. Per EMS [place] [employer] will be called once EMS is in route.   Madie Reno, RN

## 2019-06-07 NOTE — Progress Notes (Signed)
Per patient's wife, home health nurse will be coming tonight to change wound vac. Will disconnect hospital wound vac and leave dressing in place for transport.   Madie Reno, RN

## 2019-06-07 NOTE — TOC Transition Note (Signed)
Transition of Care [facility]) - CM/SW Discharge Note   Patient Details  Name: Paul Hall MRN: [id number] Date of Birth: Apr 02, 1996  [facility] of [facility]) CM/SW Contact:  Chapman Fitch, RN Phone Number: 06/07/2019, 2:26 PM   Clinical Narrative:    Patient to discharge home today Wife notified and brought home vac to bedside  Order placed for Bariatric diapers.  Brad with [employer] aware  Elnita Maxwell with [facility] notified.   MD has placed resumption of care orders  Clydie Braun with [employer] notified for outpatient palliative    Final next level of care: Home w Home Health Services Barriers to Discharge: No Barriers Identified   Patient Goals and CMS Choice        Discharge Placement                Patient to be transferred to facility by: EMS Name of family member notified: Wife Patient and family notified of of transfer: 06/07/19  Discharge Plan and Services                          HH Arranged: RN, PT, OT, Social Work, Nurse's Aide HH Agency: [facility] Date [facility] Agency Contacted: 06/07/19   Representative spoke with at [facility] Agency: Elnita Maxwell  Social Determinants of Health (SDOH) Interventions     Readmission Risk Interventions No flowsheet data found.

## 2019-06-07 NOTE — Progress Notes (Signed)
Pharmacy - Antimicrobial Stewardship  Plan per ID is linezolid x 7 days.  Prescription was sent by hospitalist to [facility] on [address]  I called the pharmacy and they have the antibiotic in stock and stated his copay is $0.  Juliette Alcide, PharmD, BCPS.   Work Cell: [phone] 06/07/2019 10:06 AM

## 2019-06-07 NOTE — Discharge Summary (Addendum)
Physician Discharge Summary  Paul Hall UXN:[id number] DOB: 10-Dec-1995 DOA: 06/05/2019  PCP: Shawnie Dapper, PA-C  Admit date: 06/05/2019 Discharge date: 06/07/2019  Recommendations for Outpatient Follow-up:  Follow up with PCP in 7-10 days. Continue PT/OT as previous at home.   Discharge Diagnoses: Principal diagnosis is #1 VRE UTI with sepsis. Relationship to foley catheter is clinically undetermined. Anemia Decubitus ulcer of sacrum History of CVA with resultant quadriplegia and aphasia due to COVID 19 infection Urinary retention Obstructive sleep apnea Seizure disorder DM II - insulin dependent Super-morbid obesity  Discharge Condition: Fair  Disposition: Home with continued home health services  Diet recommendation: Heart healthy and carbohydrate modified  Filed Weights   06/05/19 1721 06/05/19 2247  Weight: (!) 197.3 kg (!) 160.9 kg    History of present illness:  Paul Hall is a 24 y.o. male with medical history significant of COVID infection 01/2019 resulting CVA resulting in quadriplegia post  status post trach PEG that has been now removed, sacral decubitus, seizures, DM 2, morbid obesity   Presented with low-grade fever family took him to primary care provider and he was treated for UTI by PCP with Keflex blood follow up culture showed VRE at that point he was told to come to emergency department   In October patient was diagnosed with Covid required intubation and ECMO suffered CVA as a result resulting in quadriplegia.  Hospital Course:  The patient was admitted to a telemetry bed. Infectious disease was consulted. The patient received IV Linezolid. Iron studies were ordered to investigate his anemia. The patient was given supplementation with feraheme. Infectious disease was consulted and it was determined that the patient could be discharged home on oral linezolid. The patient arrived to the ED with a foley in place. The family wanted it  removed. The foley was removed and the patient underwent a voiding trial which was successful. He was discharged to home today in fair condition.  Today's assessment: S: The patient is resting quietly. No new complaints. O: Vitals:  Vitals:   06/07/19 1242 06/07/19 1454  BP: 120/83 117/72  Pulse: (!) 108 (!) 111  Resp: 20   Temp: 99.3 F (37.4 C) 99.4 F (37.4 C)  SpO2: 97% 98%   Constitutional:  Appears calm and comfortable. The patient is morbidly obese. He is in no acute distress. Respiratory:  No increased work of breathing. Lung sounds are distant. No wheezes, rales, or rhonchi are auscultated. No tactile fremitus Cardiovascular:  RRR, no m/r/g +1-2 LE extremity edema   Normal pedal pulses Abdomen:  Abdomen is morbidly obese It is soft, non-tender, non-distended. No hernias, masses or organomegaly are appreciated due to body habitus Bowel sounds are distant. Musculoskeletal:  No cyanosis, clubbing, or edema. Skin:  No rashes or lesions Stage IV sacral decubitus ulcer. palpation of skin: no induration or nodules Neurologic: Unable to evaluate as the patient is unable to cooperate with exam. Psychiatric: Unable to evaluate as the patient is unable to cooperate with exam.   Discharge Instructions  Discharge Instructions     Activity as tolerated - No restrictions   Complete by: As directed    Call MD for:  persistant nausea and vomiting   Complete by: As directed    Call MD for:  redness, tenderness, or signs of infection (pain, swelling, redness, odor or green/yellow discharge around incision site)   Complete by: As directed    Call MD for:  severe uncontrolled pain   Complete by: As directed  Call MD for:  temperature >100.4   Complete by: As directed    Diet - low sodium heart healthy   Complete by: As directed    Diet Carb Modified   Complete by: As directed    Discharge instructions   Complete by: As directed    Follow up with PCP in 7-10  days. Continue PT/OT as previous at home.   Increase activity slowly   Complete by: As directed       Allergies as of 06/07/2019       Reactions   Morphine Hives   Patient has tolerated oxycodone at high amounts during Nov 2020 hospitalization   Benzonatate Other (See Comments)        Medication List     TAKE these medications    acetaminophen 325 MG tablet Commonly known as: TYLENOL Place 2 tablets (650 mg total) into feeding tube every 6 (six) hours as needed for mild pain (or Fever >/= 101). What changed: Another medication with the same name was removed. Continue taking this medication, and follow the directions you see here.   ascorbic acid 500 MG tablet Commonly known as: VITAMIN C Take 1 tablet (500 mg total) by mouth 2 (two) times daily.   aspirin 81 MG chewable tablet Place 1 tablet (81 mg total) into feeding tube daily.   enoxaparin 40 MG/0.4ML injection Commonly known as: LOVENOX Inject 0.4 mLs (40 mg total) into the skin every 12 (twelve) hours.   famotidine 20 MG tablet Commonly known as: PEPCID Place 1 tablet (20 mg total) into feeding tube 2 (two) times daily.   feeding supplement (GLUCERNA 1.5 CAL) Liqd Place 1,000 mLs into feeding tube continuous.   nutrition supplement (JUVEN) Pack Place 1 packet into feeding tube 2 (two) times daily between meals.   free water Soln Place 100 mLs into feeding tube every 4 (four) hours.   furosemide 20 MG tablet Commonly known as: LASIX Place 1 tablet (20 mg total) into feeding tube daily.   insulin aspart 100 UNIT/ML FlexPen Commonly known as: NOVOLOG Inject 10 Units into the skin every 6 (six) hours. What changed: how much to take   insulin glargine 100 UNIT/ML Solostar Pen Commonly known as: LANTUS Inject 40 Units into the skin 2 (two) times daily. What changed: how much to take   Insulin Pen Needle 29G X Misc 1 Device by Does not apply route 2 (two) times daily.   levETIRAcetam 100 MG/ML  solution Commonly known as: KEPPRA Place 10 mLs (1,000 mg total) into feeding tube 2 (two) times daily. What changed: how much to take   linezolid 600 MG tablet Commonly known as: ZYVOX Take 1 tablet (600 mg total) by mouth 2 (two) times daily.   loperamide 2 MG capsule Commonly known as: IMODIUM Take 2 capsules (4 mg total) by mouth every 8 (eight) hours as needed for diarrhea or loose stools.   magnesium oxide 400 MG tablet Commonly known as: MAG-OX Place 400 mg into feeding tube daily.   metoprolol tartrate 25 MG tablet Commonly known as: LOPRESSOR Place 1 tablet (25 mg total) into feeding tube 3 (three) times daily.               Durable Medical Equipment  (From admission, onward)           Start     Ordered   06/07/19 1313  For home use only DME Other see comment  Once    Comments: Bariatric diapers  Question:  Length of Need  Answer:  Lifetime   06/07/19 1313           Allergies  Allergen Reactions   Morphine Hives    Patient has tolerated oxycodone at high amounts during Nov 2020 hospitalization   Benzonatate Other (See Comments)    The results of significant diagnostics from this hospitalization (including imaging, microbiology, ancillary and laboratory) are listed below for reference.    Significant Diagnostic Studies: CT HEAD WO CONTRAST  Result Date: 05/09/2019 CLINICAL DATA:  Altered mental status.  History of COVID and stroke. EXAM: CT HEAD WITHOUT CONTRAST TECHNIQUE: Contiguous axial images were obtained from the base of the skull through the vertex without intravenous contrast. COMPARISON:  CT head 11/11/2012 FINDINGS: Brain: Left cerebral convexity cortical hypodensity with associated cortical calcification extending from the frontal to the parietal lobe. Extensive area of volume loss and low-density compatible with chronic infarction. This is noted on prior reports from [facility]. Probable chronic watershed infarct. Additional small areas of  hypodensity in the right frontal white matter consistent with chronic ischemia. Negative for acute infarct. No acute hemorrhage or mass. Ventricle size normal without midline shift. Vascular: Normal arterial flow voids. Skull: Negative Sinuses/Orbits: Mucosal edema paranasal sinuses with air-fluid level left sphenoid sinus. Negative orbit Other: None IMPRESSION: Extensive watershed type infarct over the left cerebral convexity with cortical calcification. This appears chronic. No associated hemorrhage, hydrocephalus, or midline shift. These results were called by telephone at the time of interpretation on 05/09/2019 at 3:12 pm to provider Alford Highland , who verbally acknowledged these results. Electronically Signed   By: Marlan Palau M.D.   On: 05/09/2019 15:13   DG Chest Port 1 View  Result Date: 06/05/2019 CLINICAL DATA:  UTI history of hypertension EXAM: PORTABLE CHEST 1 VIEW COMPARISON:  04/29/2019 FINDINGS: Tracheostomy tube is not visualized. Low lung volumes. Borderline to mild cardiomegaly. No consolidation, pleural effusion or pneumothorax. IMPRESSION: Low lung volumes accentuating the cardiac size and central bronchovascular opacities. Borderline cardiomegaly without overt edema or focal pulmonary opacity Electronically Signed   By: Jasmine Pang M.D.   On: 06/05/2019 18:08    Microbiology: Recent Results (from the past 240 hour(s))  Urine culture     Status: Abnormal   Collection Time: 06/05/19  5:35 PM   Specimen: Urine, Random  Result Value Ref Range Status   Specimen Description   Final    URINE, RANDOM Performed at [facility], [address], [place], [place] [address]    Special Requests   Final    NONE Performed at [facility], [address] [place], [place] [address]    Culture MULTIPLE SPECIES PRESENT, SUGGEST RECOLLECTION (A)  Final   Report Status 06/06/2019 FINAL  Final  Culture, blood (Routine x 2)     Status: None (Preliminary result)    Collection Time: 06/05/19  6:24 PM   Specimen: BLOOD  Result Value Ref Range Status   Specimen Description BLOOD BLOOD RIGHT ARM  Final   Special Requests   Final    BOTTLES DRAWN AEROBIC AND ANAEROBIC Blood Culture results may not be optimal due to an inadequate volume of blood received in culture bottles   Culture   Final    NO GROWTH 2 DAYS Performed at [facility], [address] [place], [place] [address]    Report Status PENDING  Incomplete  SARS CORONAVIRUS 2 (TAT 6-24 HRS) Nasopharyngeal Nasopharyngeal Swab     Status: None   Collection  Time: 06/05/19  6:25 PM   Specimen: Nasopharyngeal Swab  Result Value Ref Range Status   SARS Coronavirus 2 NEGATIVE NEGATIVE Final    Comment: (NOTE) SARS-CoV-2 target nucleic acids are NOT DETECTED. The SARS-CoV-2 RNA is generally detectable in upper and lower respiratory specimens during the acute phase of infection. Negative results do not preclude SARS-CoV-2 infection, do not rule out co-infections with other pathogens, and should not be used as the sole basis for treatment or other patient management decisions. Negative results must be combined with clinical observations, patient history, and epidemiological information. The expected result is Negative. Fact Sheet for Patients: SugarRoll.be Fact Sheet for Healthcare Providers: https://www.woods-mathews.com/ This test is not yet approved or cleared by the [place] FDA and  has been authorized for detection and/or diagnosis of SARS-CoV-2 by FDA under an Emergency Use Authorization (EUA). This EUA will remain  in effect (meaning this test can be used) for the duration of the COVID-19 declaration under Section 56 4(b)(1) of the Act, 21 U.S.C. section 360bbb-3(b)(1), unless the authorization is terminated or revoked sooner. Performed at [facility], [place] [address] [place], [place] [address]   Culture, blood (Routine x 2)      Status: None (Preliminary result)   Collection Time: 06/05/19  7:22 PM   Specimen: BLOOD  Result Value Ref Range Status   Specimen Description BLOOD BLOOD RIGHT HAND  Final   Special Requests   Final    BOTTLES DRAWN AEROBIC AND ANAEROBIC Blood Culture adequate volume   Culture   Final    NO GROWTH 2 DAYS Performed at [facility], [place],  [address]    Report Status PENDING  Incomplete     Labs: Basic Metabolic Panel: Recent Labs  Lab 06/05/19 1824 06/06/19 0534 06/07/19 0801  NA 136 136 137  K 4.7 4.0 3.9  CL 101 102 104  CO2 23 24 29   GLUCOSE 122* 149* 151*  BUN 11 8 12   CREATININE 0.46* 0.36* 0.38*  CALCIUM 9.7 9.8 9.8  MG  --  1.7  --   PHOS  --  5.2*  --    Liver Function Tests: Recent Labs  Lab 06/05/19 1824 06/06/19 0534  AST 43* 34  ALT 95* 94*  ALKPHOS 53 48  BILITOT 0.8 0.7  PROT 8.3* 7.8  ALBUMIN 2.8* 2.7*   No results for input(s): LIPASE, AMYLASE in the last 168 hours. No results for input(s): AMMONIA in the last 168 hours. CBC: Recent Labs  Lab 06/05/19 1824 06/06/19 0534 06/07/19 0801  WBC 14.0* 12.2* 11.0*  NEUTROABS 8.4*  --   --   HGB 7.9* 9.4* 9.3*  HCT 25.7* 30.6* 31.0*  MCV 82.9 82.3 82.7  PLT 489* 452* 449*   Cardiac Enzymes: No results for input(s): CKTOTAL, CKMB, CKMBINDEX, TROPONINI in the last 168 hours. BNP: BNP (last 3 results) No results for input(s): BNP in the last 8760 hours.  ProBNP (last 3 results) No results for input(s): PROBNP in the last 8760 hours.  CBG: Recent Labs  Lab 06/06/19 2018 06/06/19 2329 06/07/19 0407 06/07/19 0754 06/07/19 1239  GLUCAP 160* 155* 155* 147* 132*    Active Problems:   H/O insulin dependent diabetes mellitus   History of CVA with residual deficit   Seizure disorder (HCC)   Decubitus ulcer of sacral region, stage 4 (HCC)   Acute metabolic encephalopathy   Obstructive sleep apnea   Morbid obesity with BMI of 60.0-69.9, adult ([place])  Urinary retention   Type 2 diabetes mellitus without complication, with long-term current use of insulin (HCC)   SIRS (systemic inflammatory response syndrome) (HCC)   Acute lower UTI   VRE (vancomycin-resistant Enterococci) infection   Anemia   Sepsis due to vancomycin resistant Enterococcus species (HCC)   Time coordinating discharge: 38 minutes.  Signed:        Tremond Shimabukuro, DO [facility]  06/07/2019, 8:34 PM

## 2019-06-10 LAB — CULTURE, BLOOD (ROUTINE X 2)
Culture: NO GROWTH
Culture: NO GROWTH
Special Requests: ADEQUATE

## 2019-06-11 ENCOUNTER — Telehealth: Payer: Self-pay

## 2019-06-11 DIAGNOSIS — G4733 Obstructive sleep apnea (adult) (pediatric): Secondary | ICD-10-CM | POA: Diagnosis not present

## 2019-06-11 NOTE — Telephone Encounter (Signed)
Phone call placed to patient's wife to check in s/p hospitalization. Per wife, patient is doing ok. He is now speaking a few words. Catheter has been removed while in hospital. Patient is voiding well. Wound has shown improvement. Offered to schedule a visit earlier than July 16, 2019. Wife did not feel that this was needed. Encouraged wife to call if needs/questions arise prior to scheduled visit.

## 2019-06-12 DIAGNOSIS — G4733 Obstructive sleep apnea (adult) (pediatric): Secondary | ICD-10-CM | POA: Diagnosis not present

## 2019-06-12 DIAGNOSIS — I10 Essential (primary) hypertension: Secondary | ICD-10-CM | POA: Diagnosis not present

## 2019-06-12 DIAGNOSIS — Z1612 Extended spectrum beta lactamase (ESBL) resistance: Secondary | ICD-10-CM | POA: Diagnosis not present

## 2019-06-12 DIAGNOSIS — N3000 Acute cystitis without hematuria: Secondary | ICD-10-CM | POA: Diagnosis not present

## 2019-06-12 DIAGNOSIS — G40909 Epilepsy, unspecified, not intractable, without status epilepticus: Secondary | ICD-10-CM | POA: Diagnosis not present

## 2019-06-12 DIAGNOSIS — E1165 Type 2 diabetes mellitus with hyperglycemia: Secondary | ICD-10-CM | POA: Diagnosis not present

## 2019-06-12 DIAGNOSIS — D649 Anemia, unspecified: Secondary | ICD-10-CM | POA: Diagnosis not present

## 2019-06-12 DIAGNOSIS — G9341 Metabolic encephalopathy: Secondary | ICD-10-CM | POA: Diagnosis not present

## 2019-06-12 DIAGNOSIS — B952 Enterococcus as the cause of diseases classified elsewhere: Secondary | ICD-10-CM | POA: Diagnosis not present

## 2019-06-12 DIAGNOSIS — G8250 Quadriplegia, unspecified: Secondary | ICD-10-CM | POA: Diagnosis not present

## 2019-06-12 DIAGNOSIS — R000 Tachycardia, unspecified: Secondary | ICD-10-CM | POA: Diagnosis not present

## 2019-06-12 DIAGNOSIS — I69320 Aphasia following cerebral infarction: Secondary | ICD-10-CM | POA: Diagnosis not present

## 2019-06-12 DIAGNOSIS — R339 Retention of urine, unspecified: Secondary | ICD-10-CM | POA: Diagnosis not present

## 2019-06-12 DIAGNOSIS — I69365 Other paralytic syndrome following cerebral infarction, bilateral: Secondary | ICD-10-CM | POA: Diagnosis not present

## 2019-06-12 DIAGNOSIS — L89154 Pressure ulcer of sacral region, stage 4: Secondary | ICD-10-CM | POA: Diagnosis not present

## 2019-06-13 DIAGNOSIS — L8994 Pressure ulcer of unspecified site, stage 4: Secondary | ICD-10-CM | POA: Diagnosis not present

## 2019-06-13 DIAGNOSIS — N390 Urinary tract infection, site not specified: Secondary | ICD-10-CM | POA: Diagnosis not present

## 2019-06-13 DIAGNOSIS — I639 Cerebral infarction, unspecified: Secondary | ICD-10-CM | POA: Diagnosis not present

## 2019-06-13 DIAGNOSIS — E1165 Type 2 diabetes mellitus with hyperglycemia: Secondary | ICD-10-CM | POA: Diagnosis not present

## 2019-06-14 DIAGNOSIS — L89154 Pressure ulcer of sacral region, stage 4: Secondary | ICD-10-CM | POA: Diagnosis not present

## 2019-06-14 DIAGNOSIS — I69365 Other paralytic syndrome following cerebral infarction, bilateral: Secondary | ICD-10-CM | POA: Diagnosis not present

## 2019-06-14 DIAGNOSIS — D649 Anemia, unspecified: Secondary | ICD-10-CM | POA: Diagnosis not present

## 2019-06-14 DIAGNOSIS — R000 Tachycardia, unspecified: Secondary | ICD-10-CM | POA: Diagnosis not present

## 2019-06-14 DIAGNOSIS — G4733 Obstructive sleep apnea (adult) (pediatric): Secondary | ICD-10-CM | POA: Diagnosis not present

## 2019-06-14 DIAGNOSIS — B952 Enterococcus as the cause of diseases classified elsewhere: Secondary | ICD-10-CM | POA: Diagnosis not present

## 2019-06-14 DIAGNOSIS — I10 Essential (primary) hypertension: Secondary | ICD-10-CM | POA: Diagnosis not present

## 2019-06-14 DIAGNOSIS — R339 Retention of urine, unspecified: Secondary | ICD-10-CM | POA: Diagnosis not present

## 2019-06-14 DIAGNOSIS — N3000 Acute cystitis without hematuria: Secondary | ICD-10-CM | POA: Diagnosis not present

## 2019-06-14 DIAGNOSIS — E1165 Type 2 diabetes mellitus with hyperglycemia: Secondary | ICD-10-CM | POA: Diagnosis not present

## 2019-06-14 DIAGNOSIS — Z1612 Extended spectrum beta lactamase (ESBL) resistance: Secondary | ICD-10-CM | POA: Diagnosis not present

## 2019-06-14 DIAGNOSIS — G8250 Quadriplegia, unspecified: Secondary | ICD-10-CM | POA: Diagnosis not present

## 2019-06-14 DIAGNOSIS — G9341 Metabolic encephalopathy: Secondary | ICD-10-CM | POA: Diagnosis not present

## 2019-06-14 DIAGNOSIS — G40909 Epilepsy, unspecified, not intractable, without status epilepticus: Secondary | ICD-10-CM | POA: Diagnosis not present

## 2019-06-14 DIAGNOSIS — I69320 Aphasia following cerebral infarction: Secondary | ICD-10-CM | POA: Diagnosis not present

## 2019-06-17 DIAGNOSIS — I69320 Aphasia following cerebral infarction: Secondary | ICD-10-CM | POA: Diagnosis not present

## 2019-06-17 DIAGNOSIS — D649 Anemia, unspecified: Secondary | ICD-10-CM | POA: Diagnosis not present

## 2019-06-17 DIAGNOSIS — N3000 Acute cystitis without hematuria: Secondary | ICD-10-CM | POA: Diagnosis not present

## 2019-06-17 DIAGNOSIS — R000 Tachycardia, unspecified: Secondary | ICD-10-CM | POA: Diagnosis not present

## 2019-06-17 DIAGNOSIS — G9341 Metabolic encephalopathy: Secondary | ICD-10-CM | POA: Diagnosis not present

## 2019-06-17 DIAGNOSIS — I10 Essential (primary) hypertension: Secondary | ICD-10-CM | POA: Diagnosis not present

## 2019-06-17 DIAGNOSIS — B952 Enterococcus as the cause of diseases classified elsewhere: Secondary | ICD-10-CM | POA: Diagnosis not present

## 2019-06-17 DIAGNOSIS — G40909 Epilepsy, unspecified, not intractable, without status epilepticus: Secondary | ICD-10-CM | POA: Diagnosis not present

## 2019-06-17 DIAGNOSIS — L89154 Pressure ulcer of sacral region, stage 4: Secondary | ICD-10-CM | POA: Diagnosis not present

## 2019-06-17 DIAGNOSIS — E1165 Type 2 diabetes mellitus with hyperglycemia: Secondary | ICD-10-CM | POA: Diagnosis not present

## 2019-06-17 DIAGNOSIS — Z1612 Extended spectrum beta lactamase (ESBL) resistance: Secondary | ICD-10-CM | POA: Diagnosis not present

## 2019-06-17 DIAGNOSIS — R339 Retention of urine, unspecified: Secondary | ICD-10-CM | POA: Diagnosis not present

## 2019-06-17 DIAGNOSIS — G8250 Quadriplegia, unspecified: Secondary | ICD-10-CM | POA: Diagnosis not present

## 2019-06-17 DIAGNOSIS — G4733 Obstructive sleep apnea (adult) (pediatric): Secondary | ICD-10-CM | POA: Diagnosis not present

## 2019-06-17 DIAGNOSIS — I69365 Other paralytic syndrome following cerebral infarction, bilateral: Secondary | ICD-10-CM | POA: Diagnosis not present

## 2019-06-18 DIAGNOSIS — G9341 Metabolic encephalopathy: Secondary | ICD-10-CM | POA: Diagnosis not present

## 2019-06-18 DIAGNOSIS — N3000 Acute cystitis without hematuria: Secondary | ICD-10-CM | POA: Diagnosis not present

## 2019-06-18 DIAGNOSIS — Z1612 Extended spectrum beta lactamase (ESBL) resistance: Secondary | ICD-10-CM | POA: Diagnosis not present

## 2019-06-18 DIAGNOSIS — I69320 Aphasia following cerebral infarction: Secondary | ICD-10-CM | POA: Diagnosis not present

## 2019-06-18 DIAGNOSIS — I10 Essential (primary) hypertension: Secondary | ICD-10-CM | POA: Diagnosis not present

## 2019-06-18 DIAGNOSIS — E1165 Type 2 diabetes mellitus with hyperglycemia: Secondary | ICD-10-CM | POA: Diagnosis not present

## 2019-06-18 DIAGNOSIS — G4733 Obstructive sleep apnea (adult) (pediatric): Secondary | ICD-10-CM | POA: Diagnosis not present

## 2019-06-18 DIAGNOSIS — L89154 Pressure ulcer of sacral region, stage 4: Secondary | ICD-10-CM | POA: Diagnosis not present

## 2019-06-18 DIAGNOSIS — R000 Tachycardia, unspecified: Secondary | ICD-10-CM | POA: Diagnosis not present

## 2019-06-18 DIAGNOSIS — I69365 Other paralytic syndrome following cerebral infarction, bilateral: Secondary | ICD-10-CM | POA: Diagnosis not present

## 2019-06-18 DIAGNOSIS — G40909 Epilepsy, unspecified, not intractable, without status epilepticus: Secondary | ICD-10-CM | POA: Diagnosis not present

## 2019-06-18 DIAGNOSIS — B952 Enterococcus as the cause of diseases classified elsewhere: Secondary | ICD-10-CM | POA: Diagnosis not present

## 2019-06-18 DIAGNOSIS — D649 Anemia, unspecified: Secondary | ICD-10-CM | POA: Diagnosis not present

## 2019-06-18 DIAGNOSIS — G8250 Quadriplegia, unspecified: Secondary | ICD-10-CM | POA: Diagnosis not present

## 2019-06-18 DIAGNOSIS — R339 Retention of urine, unspecified: Secondary | ICD-10-CM | POA: Diagnosis not present

## 2019-06-19 DIAGNOSIS — L89154 Pressure ulcer of sacral region, stage 4: Secondary | ICD-10-CM | POA: Diagnosis not present

## 2019-06-19 DIAGNOSIS — I10 Essential (primary) hypertension: Secondary | ICD-10-CM | POA: Diagnosis not present

## 2019-06-19 DIAGNOSIS — G4733 Obstructive sleep apnea (adult) (pediatric): Secondary | ICD-10-CM | POA: Diagnosis not present

## 2019-06-19 DIAGNOSIS — G40909 Epilepsy, unspecified, not intractable, without status epilepticus: Secondary | ICD-10-CM | POA: Diagnosis not present

## 2019-06-19 DIAGNOSIS — R000 Tachycardia, unspecified: Secondary | ICD-10-CM | POA: Diagnosis not present

## 2019-06-19 DIAGNOSIS — I69320 Aphasia following cerebral infarction: Secondary | ICD-10-CM | POA: Diagnosis not present

## 2019-06-19 DIAGNOSIS — I69365 Other paralytic syndrome following cerebral infarction, bilateral: Secondary | ICD-10-CM | POA: Diagnosis not present

## 2019-06-19 DIAGNOSIS — N3000 Acute cystitis without hematuria: Secondary | ICD-10-CM | POA: Diagnosis not present

## 2019-06-19 DIAGNOSIS — G8250 Quadriplegia, unspecified: Secondary | ICD-10-CM | POA: Diagnosis not present

## 2019-06-19 DIAGNOSIS — Z1612 Extended spectrum beta lactamase (ESBL) resistance: Secondary | ICD-10-CM | POA: Diagnosis not present

## 2019-06-19 DIAGNOSIS — D649 Anemia, unspecified: Secondary | ICD-10-CM | POA: Diagnosis not present

## 2019-06-19 DIAGNOSIS — B952 Enterococcus as the cause of diseases classified elsewhere: Secondary | ICD-10-CM | POA: Diagnosis not present

## 2019-06-19 DIAGNOSIS — G9341 Metabolic encephalopathy: Secondary | ICD-10-CM | POA: Diagnosis not present

## 2019-06-19 DIAGNOSIS — E1165 Type 2 diabetes mellitus with hyperglycemia: Secondary | ICD-10-CM | POA: Diagnosis not present

## 2019-06-19 DIAGNOSIS — R339 Retention of urine, unspecified: Secondary | ICD-10-CM | POA: Diagnosis not present

## 2019-06-20 DIAGNOSIS — I69365 Other paralytic syndrome following cerebral infarction, bilateral: Secondary | ICD-10-CM | POA: Diagnosis not present

## 2019-06-20 DIAGNOSIS — G4733 Obstructive sleep apnea (adult) (pediatric): Secondary | ICD-10-CM | POA: Diagnosis not present

## 2019-06-20 DIAGNOSIS — G9341 Metabolic encephalopathy: Secondary | ICD-10-CM | POA: Diagnosis not present

## 2019-06-20 DIAGNOSIS — R339 Retention of urine, unspecified: Secondary | ICD-10-CM | POA: Diagnosis not present

## 2019-06-20 DIAGNOSIS — R000 Tachycardia, unspecified: Secondary | ICD-10-CM | POA: Diagnosis not present

## 2019-06-20 DIAGNOSIS — N3000 Acute cystitis without hematuria: Secondary | ICD-10-CM | POA: Diagnosis not present

## 2019-06-20 DIAGNOSIS — D649 Anemia, unspecified: Secondary | ICD-10-CM | POA: Diagnosis not present

## 2019-06-20 DIAGNOSIS — G40909 Epilepsy, unspecified, not intractable, without status epilepticus: Secondary | ICD-10-CM | POA: Diagnosis not present

## 2019-06-20 DIAGNOSIS — I10 Essential (primary) hypertension: Secondary | ICD-10-CM | POA: Diagnosis not present

## 2019-06-20 DIAGNOSIS — L89154 Pressure ulcer of sacral region, stage 4: Secondary | ICD-10-CM | POA: Diagnosis not present

## 2019-06-20 DIAGNOSIS — G8250 Quadriplegia, unspecified: Secondary | ICD-10-CM | POA: Diagnosis not present

## 2019-06-20 DIAGNOSIS — E1165 Type 2 diabetes mellitus with hyperglycemia: Secondary | ICD-10-CM | POA: Diagnosis not present

## 2019-06-20 DIAGNOSIS — Z1612 Extended spectrum beta lactamase (ESBL) resistance: Secondary | ICD-10-CM | POA: Diagnosis not present

## 2019-06-20 DIAGNOSIS — B952 Enterococcus as the cause of diseases classified elsewhere: Secondary | ICD-10-CM | POA: Diagnosis not present

## 2019-06-20 DIAGNOSIS — I69320 Aphasia following cerebral infarction: Secondary | ICD-10-CM | POA: Diagnosis not present

## 2019-06-21 DIAGNOSIS — D649 Anemia, unspecified: Secondary | ICD-10-CM | POA: Diagnosis not present

## 2019-06-21 DIAGNOSIS — I69320 Aphasia following cerebral infarction: Secondary | ICD-10-CM | POA: Diagnosis not present

## 2019-06-21 DIAGNOSIS — L89154 Pressure ulcer of sacral region, stage 4: Secondary | ICD-10-CM | POA: Diagnosis not present

## 2019-06-21 DIAGNOSIS — G40909 Epilepsy, unspecified, not intractable, without status epilepticus: Secondary | ICD-10-CM | POA: Diagnosis not present

## 2019-06-21 DIAGNOSIS — R000 Tachycardia, unspecified: Secondary | ICD-10-CM | POA: Diagnosis not present

## 2019-06-21 DIAGNOSIS — E1165 Type 2 diabetes mellitus with hyperglycemia: Secondary | ICD-10-CM | POA: Diagnosis not present

## 2019-06-21 DIAGNOSIS — G8250 Quadriplegia, unspecified: Secondary | ICD-10-CM | POA: Diagnosis not present

## 2019-06-21 DIAGNOSIS — N3000 Acute cystitis without hematuria: Secondary | ICD-10-CM | POA: Diagnosis not present

## 2019-06-21 DIAGNOSIS — I69365 Other paralytic syndrome following cerebral infarction, bilateral: Secondary | ICD-10-CM | POA: Diagnosis not present

## 2019-06-21 DIAGNOSIS — G9341 Metabolic encephalopathy: Secondary | ICD-10-CM | POA: Diagnosis not present

## 2019-06-21 DIAGNOSIS — I10 Essential (primary) hypertension: Secondary | ICD-10-CM | POA: Diagnosis not present

## 2019-06-21 DIAGNOSIS — R339 Retention of urine, unspecified: Secondary | ICD-10-CM | POA: Diagnosis not present

## 2019-06-21 DIAGNOSIS — Z1612 Extended spectrum beta lactamase (ESBL) resistance: Secondary | ICD-10-CM | POA: Diagnosis not present

## 2019-06-21 DIAGNOSIS — G4733 Obstructive sleep apnea (adult) (pediatric): Secondary | ICD-10-CM | POA: Diagnosis not present

## 2019-06-21 DIAGNOSIS — B952 Enterococcus as the cause of diseases classified elsewhere: Secondary | ICD-10-CM | POA: Diagnosis not present

## 2019-06-24 DIAGNOSIS — G40909 Epilepsy, unspecified, not intractable, without status epilepticus: Secondary | ICD-10-CM | POA: Diagnosis not present

## 2019-06-24 DIAGNOSIS — R339 Retention of urine, unspecified: Secondary | ICD-10-CM | POA: Diagnosis not present

## 2019-06-24 DIAGNOSIS — G8250 Quadriplegia, unspecified: Secondary | ICD-10-CM | POA: Diagnosis not present

## 2019-06-24 DIAGNOSIS — G9341 Metabolic encephalopathy: Secondary | ICD-10-CM | POA: Diagnosis not present

## 2019-06-24 DIAGNOSIS — I10 Essential (primary) hypertension: Secondary | ICD-10-CM | POA: Diagnosis not present

## 2019-06-24 DIAGNOSIS — I69320 Aphasia following cerebral infarction: Secondary | ICD-10-CM | POA: Diagnosis not present

## 2019-06-24 DIAGNOSIS — E1165 Type 2 diabetes mellitus with hyperglycemia: Secondary | ICD-10-CM | POA: Diagnosis not present

## 2019-06-24 DIAGNOSIS — G4733 Obstructive sleep apnea (adult) (pediatric): Secondary | ICD-10-CM | POA: Diagnosis not present

## 2019-06-24 DIAGNOSIS — R000 Tachycardia, unspecified: Secondary | ICD-10-CM | POA: Diagnosis not present

## 2019-06-24 DIAGNOSIS — N3000 Acute cystitis without hematuria: Secondary | ICD-10-CM | POA: Diagnosis not present

## 2019-06-24 DIAGNOSIS — L89154 Pressure ulcer of sacral region, stage 4: Secondary | ICD-10-CM | POA: Diagnosis not present

## 2019-06-24 DIAGNOSIS — I69365 Other paralytic syndrome following cerebral infarction, bilateral: Secondary | ICD-10-CM | POA: Diagnosis not present

## 2019-06-24 DIAGNOSIS — B952 Enterococcus as the cause of diseases classified elsewhere: Secondary | ICD-10-CM | POA: Diagnosis not present

## 2019-06-24 DIAGNOSIS — D649 Anemia, unspecified: Secondary | ICD-10-CM | POA: Diagnosis not present

## 2019-06-24 DIAGNOSIS — Z1612 Extended spectrum beta lactamase (ESBL) resistance: Secondary | ICD-10-CM | POA: Diagnosis not present

## 2019-06-25 DIAGNOSIS — R339 Retention of urine, unspecified: Secondary | ICD-10-CM | POA: Diagnosis not present

## 2019-06-25 DIAGNOSIS — N3000 Acute cystitis without hematuria: Secondary | ICD-10-CM | POA: Diagnosis not present

## 2019-06-25 DIAGNOSIS — D649 Anemia, unspecified: Secondary | ICD-10-CM | POA: Diagnosis not present

## 2019-06-25 DIAGNOSIS — L89154 Pressure ulcer of sacral region, stage 4: Secondary | ICD-10-CM | POA: Diagnosis not present

## 2019-06-25 DIAGNOSIS — I10 Essential (primary) hypertension: Secondary | ICD-10-CM | POA: Diagnosis not present

## 2019-06-25 DIAGNOSIS — I69365 Other paralytic syndrome following cerebral infarction, bilateral: Secondary | ICD-10-CM | POA: Diagnosis not present

## 2019-06-25 DIAGNOSIS — G8250 Quadriplegia, unspecified: Secondary | ICD-10-CM | POA: Diagnosis not present

## 2019-06-25 DIAGNOSIS — G40909 Epilepsy, unspecified, not intractable, without status epilepticus: Secondary | ICD-10-CM | POA: Diagnosis not present

## 2019-06-25 DIAGNOSIS — Z1612 Extended spectrum beta lactamase (ESBL) resistance: Secondary | ICD-10-CM | POA: Diagnosis not present

## 2019-06-25 DIAGNOSIS — G4733 Obstructive sleep apnea (adult) (pediatric): Secondary | ICD-10-CM | POA: Diagnosis not present

## 2019-06-25 DIAGNOSIS — G9341 Metabolic encephalopathy: Secondary | ICD-10-CM | POA: Diagnosis not present

## 2019-06-25 DIAGNOSIS — E1165 Type 2 diabetes mellitus with hyperglycemia: Secondary | ICD-10-CM | POA: Diagnosis not present

## 2019-06-25 DIAGNOSIS — R000 Tachycardia, unspecified: Secondary | ICD-10-CM | POA: Diagnosis not present

## 2019-06-25 DIAGNOSIS — B952 Enterococcus as the cause of diseases classified elsewhere: Secondary | ICD-10-CM | POA: Diagnosis not present

## 2019-06-25 DIAGNOSIS — I69320 Aphasia following cerebral infarction: Secondary | ICD-10-CM | POA: Diagnosis not present

## 2019-06-26 DIAGNOSIS — G40909 Epilepsy, unspecified, not intractable, without status epilepticus: Secondary | ICD-10-CM | POA: Diagnosis not present

## 2019-06-26 DIAGNOSIS — R000 Tachycardia, unspecified: Secondary | ICD-10-CM | POA: Diagnosis not present

## 2019-06-26 DIAGNOSIS — N3000 Acute cystitis without hematuria: Secondary | ICD-10-CM | POA: Diagnosis not present

## 2019-06-26 DIAGNOSIS — B952 Enterococcus as the cause of diseases classified elsewhere: Secondary | ICD-10-CM | POA: Diagnosis not present

## 2019-06-26 DIAGNOSIS — E1165 Type 2 diabetes mellitus with hyperglycemia: Secondary | ICD-10-CM | POA: Diagnosis not present

## 2019-06-26 DIAGNOSIS — Z1612 Extended spectrum beta lactamase (ESBL) resistance: Secondary | ICD-10-CM | POA: Diagnosis not present

## 2019-06-26 DIAGNOSIS — G8250 Quadriplegia, unspecified: Secondary | ICD-10-CM | POA: Diagnosis not present

## 2019-06-26 DIAGNOSIS — I10 Essential (primary) hypertension: Secondary | ICD-10-CM | POA: Diagnosis not present

## 2019-06-26 DIAGNOSIS — G4733 Obstructive sleep apnea (adult) (pediatric): Secondary | ICD-10-CM | POA: Diagnosis not present

## 2019-06-26 DIAGNOSIS — L89154 Pressure ulcer of sacral region, stage 4: Secondary | ICD-10-CM | POA: Diagnosis not present

## 2019-06-26 DIAGNOSIS — D649 Anemia, unspecified: Secondary | ICD-10-CM | POA: Diagnosis not present

## 2019-06-26 DIAGNOSIS — R339 Retention of urine, unspecified: Secondary | ICD-10-CM | POA: Diagnosis not present

## 2019-06-26 DIAGNOSIS — G9341 Metabolic encephalopathy: Secondary | ICD-10-CM | POA: Diagnosis not present

## 2019-06-26 DIAGNOSIS — I69320 Aphasia following cerebral infarction: Secondary | ICD-10-CM | POA: Diagnosis not present

## 2019-06-26 DIAGNOSIS — I69365 Other paralytic syndrome following cerebral infarction, bilateral: Secondary | ICD-10-CM | POA: Diagnosis not present

## 2019-06-27 DIAGNOSIS — I69320 Aphasia following cerebral infarction: Secondary | ICD-10-CM | POA: Diagnosis not present

## 2019-06-27 DIAGNOSIS — G9341 Metabolic encephalopathy: Secondary | ICD-10-CM | POA: Diagnosis not present

## 2019-06-27 DIAGNOSIS — R339 Retention of urine, unspecified: Secondary | ICD-10-CM | POA: Diagnosis not present

## 2019-06-27 DIAGNOSIS — D649 Anemia, unspecified: Secondary | ICD-10-CM | POA: Diagnosis not present

## 2019-06-27 DIAGNOSIS — E1165 Type 2 diabetes mellitus with hyperglycemia: Secondary | ICD-10-CM | POA: Diagnosis not present

## 2019-06-27 DIAGNOSIS — B952 Enterococcus as the cause of diseases classified elsewhere: Secondary | ICD-10-CM | POA: Diagnosis not present

## 2019-06-27 DIAGNOSIS — L89154 Pressure ulcer of sacral region, stage 4: Secondary | ICD-10-CM | POA: Diagnosis not present

## 2019-06-27 DIAGNOSIS — R000 Tachycardia, unspecified: Secondary | ICD-10-CM | POA: Diagnosis not present

## 2019-06-27 DIAGNOSIS — G8250 Quadriplegia, unspecified: Secondary | ICD-10-CM | POA: Diagnosis not present

## 2019-06-27 DIAGNOSIS — N3000 Acute cystitis without hematuria: Secondary | ICD-10-CM | POA: Diagnosis not present

## 2019-06-27 DIAGNOSIS — I69365 Other paralytic syndrome following cerebral infarction, bilateral: Secondary | ICD-10-CM | POA: Diagnosis not present

## 2019-06-27 DIAGNOSIS — G4733 Obstructive sleep apnea (adult) (pediatric): Secondary | ICD-10-CM | POA: Diagnosis not present

## 2019-06-27 DIAGNOSIS — Z1612 Extended spectrum beta lactamase (ESBL) resistance: Secondary | ICD-10-CM | POA: Diagnosis not present

## 2019-06-27 DIAGNOSIS — G40909 Epilepsy, unspecified, not intractable, without status epilepticus: Secondary | ICD-10-CM | POA: Diagnosis not present

## 2019-06-27 DIAGNOSIS — I10 Essential (primary) hypertension: Secondary | ICD-10-CM | POA: Diagnosis not present

## 2019-06-28 DIAGNOSIS — G9341 Metabolic encephalopathy: Secondary | ICD-10-CM | POA: Diagnosis not present

## 2019-06-28 DIAGNOSIS — R339 Retention of urine, unspecified: Secondary | ICD-10-CM | POA: Diagnosis not present

## 2019-06-28 DIAGNOSIS — L89154 Pressure ulcer of sacral region, stage 4: Secondary | ICD-10-CM | POA: Diagnosis not present

## 2019-06-28 DIAGNOSIS — G40909 Epilepsy, unspecified, not intractable, without status epilepticus: Secondary | ICD-10-CM | POA: Diagnosis not present

## 2019-06-28 DIAGNOSIS — R000 Tachycardia, unspecified: Secondary | ICD-10-CM | POA: Diagnosis not present

## 2019-06-28 DIAGNOSIS — I10 Essential (primary) hypertension: Secondary | ICD-10-CM | POA: Diagnosis not present

## 2019-06-28 DIAGNOSIS — B952 Enterococcus as the cause of diseases classified elsewhere: Secondary | ICD-10-CM | POA: Diagnosis not present

## 2019-06-28 DIAGNOSIS — G4733 Obstructive sleep apnea (adult) (pediatric): Secondary | ICD-10-CM | POA: Diagnosis not present

## 2019-06-28 DIAGNOSIS — I69320 Aphasia following cerebral infarction: Secondary | ICD-10-CM | POA: Diagnosis not present

## 2019-06-28 DIAGNOSIS — E1165 Type 2 diabetes mellitus with hyperglycemia: Secondary | ICD-10-CM | POA: Diagnosis not present

## 2019-06-28 DIAGNOSIS — N3000 Acute cystitis without hematuria: Secondary | ICD-10-CM | POA: Diagnosis not present

## 2019-06-28 DIAGNOSIS — Z1612 Extended spectrum beta lactamase (ESBL) resistance: Secondary | ICD-10-CM | POA: Diagnosis not present

## 2019-06-28 DIAGNOSIS — G8250 Quadriplegia, unspecified: Secondary | ICD-10-CM | POA: Diagnosis not present

## 2019-06-28 DIAGNOSIS — I69365 Other paralytic syndrome following cerebral infarction, bilateral: Secondary | ICD-10-CM | POA: Diagnosis not present

## 2019-06-28 DIAGNOSIS — D649 Anemia, unspecified: Secondary | ICD-10-CM | POA: Diagnosis not present

## 2019-07-01 DIAGNOSIS — N3000 Acute cystitis without hematuria: Secondary | ICD-10-CM | POA: Diagnosis not present

## 2019-07-01 DIAGNOSIS — B952 Enterococcus as the cause of diseases classified elsewhere: Secondary | ICD-10-CM | POA: Diagnosis not present

## 2019-07-01 DIAGNOSIS — G4733 Obstructive sleep apnea (adult) (pediatric): Secondary | ICD-10-CM | POA: Diagnosis not present

## 2019-07-01 DIAGNOSIS — R000 Tachycardia, unspecified: Secondary | ICD-10-CM | POA: Diagnosis not present

## 2019-07-01 DIAGNOSIS — G9341 Metabolic encephalopathy: Secondary | ICD-10-CM | POA: Diagnosis not present

## 2019-07-01 DIAGNOSIS — R339 Retention of urine, unspecified: Secondary | ICD-10-CM | POA: Diagnosis not present

## 2019-07-01 DIAGNOSIS — Z1612 Extended spectrum beta lactamase (ESBL) resistance: Secondary | ICD-10-CM | POA: Diagnosis not present

## 2019-07-01 DIAGNOSIS — E1165 Type 2 diabetes mellitus with hyperglycemia: Secondary | ICD-10-CM | POA: Diagnosis not present

## 2019-07-01 DIAGNOSIS — L89154 Pressure ulcer of sacral region, stage 4: Secondary | ICD-10-CM | POA: Diagnosis not present

## 2019-07-01 DIAGNOSIS — G40909 Epilepsy, unspecified, not intractable, without status epilepticus: Secondary | ICD-10-CM | POA: Diagnosis not present

## 2019-07-01 DIAGNOSIS — I10 Essential (primary) hypertension: Secondary | ICD-10-CM | POA: Diagnosis not present

## 2019-07-01 DIAGNOSIS — G8250 Quadriplegia, unspecified: Secondary | ICD-10-CM | POA: Diagnosis not present

## 2019-07-01 DIAGNOSIS — I69320 Aphasia following cerebral infarction: Secondary | ICD-10-CM | POA: Diagnosis not present

## 2019-07-01 DIAGNOSIS — D649 Anemia, unspecified: Secondary | ICD-10-CM | POA: Diagnosis not present

## 2019-07-01 DIAGNOSIS — I69365 Other paralytic syndrome following cerebral infarction, bilateral: Secondary | ICD-10-CM | POA: Diagnosis not present

## 2019-07-02 DIAGNOSIS — L89154 Pressure ulcer of sacral region, stage 4: Secondary | ICD-10-CM | POA: Diagnosis not present

## 2019-07-02 DIAGNOSIS — B952 Enterococcus as the cause of diseases classified elsewhere: Secondary | ICD-10-CM | POA: Diagnosis not present

## 2019-07-02 DIAGNOSIS — R339 Retention of urine, unspecified: Secondary | ICD-10-CM | POA: Diagnosis not present

## 2019-07-02 DIAGNOSIS — D649 Anemia, unspecified: Secondary | ICD-10-CM | POA: Diagnosis not present

## 2019-07-02 DIAGNOSIS — Z1612 Extended spectrum beta lactamase (ESBL) resistance: Secondary | ICD-10-CM | POA: Diagnosis not present

## 2019-07-02 DIAGNOSIS — E1165 Type 2 diabetes mellitus with hyperglycemia: Secondary | ICD-10-CM | POA: Diagnosis not present

## 2019-07-02 DIAGNOSIS — G40909 Epilepsy, unspecified, not intractable, without status epilepticus: Secondary | ICD-10-CM | POA: Diagnosis not present

## 2019-07-02 DIAGNOSIS — G8250 Quadriplegia, unspecified: Secondary | ICD-10-CM | POA: Diagnosis not present

## 2019-07-02 DIAGNOSIS — I69365 Other paralytic syndrome following cerebral infarction, bilateral: Secondary | ICD-10-CM | POA: Diagnosis not present

## 2019-07-02 DIAGNOSIS — I10 Essential (primary) hypertension: Secondary | ICD-10-CM | POA: Diagnosis not present

## 2019-07-02 DIAGNOSIS — R000 Tachycardia, unspecified: Secondary | ICD-10-CM | POA: Diagnosis not present

## 2019-07-02 DIAGNOSIS — N3000 Acute cystitis without hematuria: Secondary | ICD-10-CM | POA: Diagnosis not present

## 2019-07-02 DIAGNOSIS — I69320 Aphasia following cerebral infarction: Secondary | ICD-10-CM | POA: Diagnosis not present

## 2019-07-02 DIAGNOSIS — G9341 Metabolic encephalopathy: Secondary | ICD-10-CM | POA: Diagnosis not present

## 2019-07-02 DIAGNOSIS — G4733 Obstructive sleep apnea (adult) (pediatric): Secondary | ICD-10-CM | POA: Diagnosis not present

## 2019-07-03 DIAGNOSIS — R000 Tachycardia, unspecified: Secondary | ICD-10-CM | POA: Diagnosis not present

## 2019-07-03 DIAGNOSIS — B952 Enterococcus as the cause of diseases classified elsewhere: Secondary | ICD-10-CM | POA: Diagnosis not present

## 2019-07-03 DIAGNOSIS — G9341 Metabolic encephalopathy: Secondary | ICD-10-CM | POA: Diagnosis not present

## 2019-07-03 DIAGNOSIS — L89154 Pressure ulcer of sacral region, stage 4: Secondary | ICD-10-CM | POA: Diagnosis not present

## 2019-07-03 DIAGNOSIS — I10 Essential (primary) hypertension: Secondary | ICD-10-CM | POA: Diagnosis not present

## 2019-07-03 DIAGNOSIS — I69320 Aphasia following cerebral infarction: Secondary | ICD-10-CM | POA: Diagnosis not present

## 2019-07-03 DIAGNOSIS — Z1612 Extended spectrum beta lactamase (ESBL) resistance: Secondary | ICD-10-CM | POA: Diagnosis not present

## 2019-07-03 DIAGNOSIS — G4733 Obstructive sleep apnea (adult) (pediatric): Secondary | ICD-10-CM | POA: Diagnosis not present

## 2019-07-03 DIAGNOSIS — G40909 Epilepsy, unspecified, not intractable, without status epilepticus: Secondary | ICD-10-CM | POA: Diagnosis not present

## 2019-07-03 DIAGNOSIS — R339 Retention of urine, unspecified: Secondary | ICD-10-CM | POA: Diagnosis not present

## 2019-07-03 DIAGNOSIS — I69365 Other paralytic syndrome following cerebral infarction, bilateral: Secondary | ICD-10-CM | POA: Diagnosis not present

## 2019-07-03 DIAGNOSIS — E1165 Type 2 diabetes mellitus with hyperglycemia: Secondary | ICD-10-CM | POA: Diagnosis not present

## 2019-07-03 DIAGNOSIS — D649 Anemia, unspecified: Secondary | ICD-10-CM | POA: Diagnosis not present

## 2019-07-03 DIAGNOSIS — G8250 Quadriplegia, unspecified: Secondary | ICD-10-CM | POA: Diagnosis not present

## 2019-07-03 DIAGNOSIS — N3000 Acute cystitis without hematuria: Secondary | ICD-10-CM | POA: Diagnosis not present

## 2019-07-04 DIAGNOSIS — G8250 Quadriplegia, unspecified: Secondary | ICD-10-CM | POA: Diagnosis not present

## 2019-07-04 DIAGNOSIS — Z930 Tracheostomy status: Secondary | ICD-10-CM | POA: Diagnosis not present

## 2019-07-04 DIAGNOSIS — I69365 Other paralytic syndrome following cerebral infarction, bilateral: Secondary | ICD-10-CM | POA: Diagnosis not present

## 2019-07-04 DIAGNOSIS — E1165 Type 2 diabetes mellitus with hyperglycemia: Secondary | ICD-10-CM | POA: Diagnosis not present

## 2019-07-04 DIAGNOSIS — R000 Tachycardia, unspecified: Secondary | ICD-10-CM | POA: Diagnosis not present

## 2019-07-04 DIAGNOSIS — G4733 Obstructive sleep apnea (adult) (pediatric): Secondary | ICD-10-CM | POA: Diagnosis not present

## 2019-07-04 DIAGNOSIS — G40909 Epilepsy, unspecified, not intractable, without status epilepticus: Secondary | ICD-10-CM | POA: Diagnosis not present

## 2019-07-04 DIAGNOSIS — I10 Essential (primary) hypertension: Secondary | ICD-10-CM | POA: Diagnosis not present

## 2019-07-04 DIAGNOSIS — I69320 Aphasia following cerebral infarction: Secondary | ICD-10-CM | POA: Diagnosis not present

## 2019-07-04 DIAGNOSIS — N3000 Acute cystitis without hematuria: Secondary | ICD-10-CM | POA: Diagnosis not present

## 2019-07-04 DIAGNOSIS — Z1612 Extended spectrum beta lactamase (ESBL) resistance: Secondary | ICD-10-CM | POA: Diagnosis not present

## 2019-07-04 DIAGNOSIS — R339 Retention of urine, unspecified: Secondary | ICD-10-CM | POA: Diagnosis not present

## 2019-07-04 DIAGNOSIS — T8189XA Other complications of procedures, not elsewhere classified, initial encounter: Secondary | ICD-10-CM | POA: Diagnosis not present

## 2019-07-04 DIAGNOSIS — L89154 Pressure ulcer of sacral region, stage 4: Secondary | ICD-10-CM | POA: Diagnosis not present

## 2019-07-04 DIAGNOSIS — D649 Anemia, unspecified: Secondary | ICD-10-CM | POA: Diagnosis not present

## 2019-07-04 DIAGNOSIS — B952 Enterococcus as the cause of diseases classified elsewhere: Secondary | ICD-10-CM | POA: Diagnosis not present

## 2019-07-04 DIAGNOSIS — G9341 Metabolic encephalopathy: Secondary | ICD-10-CM | POA: Diagnosis not present

## 2019-07-05 DIAGNOSIS — L89154 Pressure ulcer of sacral region, stage 4: Secondary | ICD-10-CM | POA: Diagnosis not present

## 2019-07-05 DIAGNOSIS — G9341 Metabolic encephalopathy: Secondary | ICD-10-CM | POA: Diagnosis not present

## 2019-07-05 DIAGNOSIS — G8250 Quadriplegia, unspecified: Secondary | ICD-10-CM | POA: Diagnosis not present

## 2019-07-05 DIAGNOSIS — R000 Tachycardia, unspecified: Secondary | ICD-10-CM | POA: Diagnosis not present

## 2019-07-05 DIAGNOSIS — E1165 Type 2 diabetes mellitus with hyperglycemia: Secondary | ICD-10-CM | POA: Diagnosis not present

## 2019-07-05 DIAGNOSIS — Z1612 Extended spectrum beta lactamase (ESBL) resistance: Secondary | ICD-10-CM | POA: Diagnosis not present

## 2019-07-05 DIAGNOSIS — I69320 Aphasia following cerebral infarction: Secondary | ICD-10-CM | POA: Diagnosis not present

## 2019-07-05 DIAGNOSIS — N3000 Acute cystitis without hematuria: Secondary | ICD-10-CM | POA: Diagnosis not present

## 2019-07-05 DIAGNOSIS — I10 Essential (primary) hypertension: Secondary | ICD-10-CM | POA: Diagnosis not present

## 2019-07-05 DIAGNOSIS — D649 Anemia, unspecified: Secondary | ICD-10-CM | POA: Diagnosis not present

## 2019-07-05 DIAGNOSIS — I69365 Other paralytic syndrome following cerebral infarction, bilateral: Secondary | ICD-10-CM | POA: Diagnosis not present

## 2019-07-05 DIAGNOSIS — R339 Retention of urine, unspecified: Secondary | ICD-10-CM | POA: Diagnosis not present

## 2019-07-05 DIAGNOSIS — G4733 Obstructive sleep apnea (adult) (pediatric): Secondary | ICD-10-CM | POA: Diagnosis not present

## 2019-07-05 DIAGNOSIS — B952 Enterococcus as the cause of diseases classified elsewhere: Secondary | ICD-10-CM | POA: Diagnosis not present

## 2019-07-05 DIAGNOSIS — G40909 Epilepsy, unspecified, not intractable, without status epilepticus: Secondary | ICD-10-CM | POA: Diagnosis not present

## 2019-07-08 DIAGNOSIS — N3000 Acute cystitis without hematuria: Secondary | ICD-10-CM | POA: Diagnosis not present

## 2019-07-08 DIAGNOSIS — I69365 Other paralytic syndrome following cerebral infarction, bilateral: Secondary | ICD-10-CM | POA: Diagnosis not present

## 2019-07-08 DIAGNOSIS — B952 Enterococcus as the cause of diseases classified elsewhere: Secondary | ICD-10-CM | POA: Diagnosis not present

## 2019-07-08 DIAGNOSIS — R339 Retention of urine, unspecified: Secondary | ICD-10-CM | POA: Diagnosis not present

## 2019-07-08 DIAGNOSIS — L89154 Pressure ulcer of sacral region, stage 4: Secondary | ICD-10-CM | POA: Diagnosis not present

## 2019-07-08 DIAGNOSIS — Z1612 Extended spectrum beta lactamase (ESBL) resistance: Secondary | ICD-10-CM | POA: Diagnosis not present

## 2019-07-08 DIAGNOSIS — G9341 Metabolic encephalopathy: Secondary | ICD-10-CM | POA: Diagnosis not present

## 2019-07-08 DIAGNOSIS — R000 Tachycardia, unspecified: Secondary | ICD-10-CM | POA: Diagnosis not present

## 2019-07-08 DIAGNOSIS — G40909 Epilepsy, unspecified, not intractable, without status epilepticus: Secondary | ICD-10-CM | POA: Diagnosis not present

## 2019-07-08 DIAGNOSIS — I10 Essential (primary) hypertension: Secondary | ICD-10-CM | POA: Diagnosis not present

## 2019-07-08 DIAGNOSIS — G4733 Obstructive sleep apnea (adult) (pediatric): Secondary | ICD-10-CM | POA: Diagnosis not present

## 2019-07-08 DIAGNOSIS — D649 Anemia, unspecified: Secondary | ICD-10-CM | POA: Diagnosis not present

## 2019-07-08 DIAGNOSIS — I69320 Aphasia following cerebral infarction: Secondary | ICD-10-CM | POA: Diagnosis not present

## 2019-07-08 DIAGNOSIS — G8250 Quadriplegia, unspecified: Secondary | ICD-10-CM | POA: Diagnosis not present

## 2019-07-08 DIAGNOSIS — E1165 Type 2 diabetes mellitus with hyperglycemia: Secondary | ICD-10-CM | POA: Diagnosis not present

## 2019-07-09 DIAGNOSIS — N3000 Acute cystitis without hematuria: Secondary | ICD-10-CM | POA: Diagnosis not present

## 2019-07-09 DIAGNOSIS — I69320 Aphasia following cerebral infarction: Secondary | ICD-10-CM | POA: Diagnosis not present

## 2019-07-09 DIAGNOSIS — L89154 Pressure ulcer of sacral region, stage 4: Secondary | ICD-10-CM | POA: Diagnosis not present

## 2019-07-09 DIAGNOSIS — I10 Essential (primary) hypertension: Secondary | ICD-10-CM | POA: Diagnosis not present

## 2019-07-09 DIAGNOSIS — B952 Enterococcus as the cause of diseases classified elsewhere: Secondary | ICD-10-CM | POA: Diagnosis not present

## 2019-07-09 DIAGNOSIS — G9341 Metabolic encephalopathy: Secondary | ICD-10-CM | POA: Diagnosis not present

## 2019-07-09 DIAGNOSIS — D649 Anemia, unspecified: Secondary | ICD-10-CM | POA: Diagnosis not present

## 2019-07-09 DIAGNOSIS — E1165 Type 2 diabetes mellitus with hyperglycemia: Secondary | ICD-10-CM | POA: Diagnosis not present

## 2019-07-09 DIAGNOSIS — G4733 Obstructive sleep apnea (adult) (pediatric): Secondary | ICD-10-CM | POA: Diagnosis not present

## 2019-07-09 DIAGNOSIS — R339 Retention of urine, unspecified: Secondary | ICD-10-CM | POA: Diagnosis not present

## 2019-07-09 DIAGNOSIS — I69365 Other paralytic syndrome following cerebral infarction, bilateral: Secondary | ICD-10-CM | POA: Diagnosis not present

## 2019-07-09 DIAGNOSIS — G40909 Epilepsy, unspecified, not intractable, without status epilepticus: Secondary | ICD-10-CM | POA: Diagnosis not present

## 2019-07-09 DIAGNOSIS — R000 Tachycardia, unspecified: Secondary | ICD-10-CM | POA: Diagnosis not present

## 2019-07-09 DIAGNOSIS — G8250 Quadriplegia, unspecified: Secondary | ICD-10-CM | POA: Diagnosis not present

## 2019-07-09 DIAGNOSIS — Z1612 Extended spectrum beta lactamase (ESBL) resistance: Secondary | ICD-10-CM | POA: Diagnosis not present

## 2019-07-10 DIAGNOSIS — E1165 Type 2 diabetes mellitus with hyperglycemia: Secondary | ICD-10-CM | POA: Diagnosis not present

## 2019-07-10 DIAGNOSIS — R339 Retention of urine, unspecified: Secondary | ICD-10-CM | POA: Diagnosis not present

## 2019-07-10 DIAGNOSIS — D649 Anemia, unspecified: Secondary | ICD-10-CM | POA: Diagnosis not present

## 2019-07-10 DIAGNOSIS — R000 Tachycardia, unspecified: Secondary | ICD-10-CM | POA: Diagnosis not present

## 2019-07-10 DIAGNOSIS — G9341 Metabolic encephalopathy: Secondary | ICD-10-CM | POA: Diagnosis not present

## 2019-07-10 DIAGNOSIS — B952 Enterococcus as the cause of diseases classified elsewhere: Secondary | ICD-10-CM | POA: Diagnosis not present

## 2019-07-10 DIAGNOSIS — L89154 Pressure ulcer of sacral region, stage 4: Secondary | ICD-10-CM | POA: Diagnosis not present

## 2019-07-10 DIAGNOSIS — I69320 Aphasia following cerebral infarction: Secondary | ICD-10-CM | POA: Diagnosis not present

## 2019-07-10 DIAGNOSIS — N3000 Acute cystitis without hematuria: Secondary | ICD-10-CM | POA: Diagnosis not present

## 2019-07-10 DIAGNOSIS — G40909 Epilepsy, unspecified, not intractable, without status epilepticus: Secondary | ICD-10-CM | POA: Diagnosis not present

## 2019-07-10 DIAGNOSIS — I69365 Other paralytic syndrome following cerebral infarction, bilateral: Secondary | ICD-10-CM | POA: Diagnosis not present

## 2019-07-10 DIAGNOSIS — G4733 Obstructive sleep apnea (adult) (pediatric): Secondary | ICD-10-CM | POA: Diagnosis not present

## 2019-07-10 DIAGNOSIS — G8250 Quadriplegia, unspecified: Secondary | ICD-10-CM | POA: Diagnosis not present

## 2019-07-10 DIAGNOSIS — I10 Essential (primary) hypertension: Secondary | ICD-10-CM | POA: Diagnosis not present

## 2019-07-10 DIAGNOSIS — Z1612 Extended spectrum beta lactamase (ESBL) resistance: Secondary | ICD-10-CM | POA: Diagnosis not present

## 2019-07-11 DIAGNOSIS — N3000 Acute cystitis without hematuria: Secondary | ICD-10-CM | POA: Diagnosis not present

## 2019-07-11 DIAGNOSIS — G8250 Quadriplegia, unspecified: Secondary | ICD-10-CM | POA: Diagnosis not present

## 2019-07-11 DIAGNOSIS — R339 Retention of urine, unspecified: Secondary | ICD-10-CM | POA: Diagnosis not present

## 2019-07-11 DIAGNOSIS — L89154 Pressure ulcer of sacral region, stage 4: Secondary | ICD-10-CM | POA: Diagnosis not present

## 2019-07-11 DIAGNOSIS — I69320 Aphasia following cerebral infarction: Secondary | ICD-10-CM | POA: Diagnosis not present

## 2019-07-11 DIAGNOSIS — G40909 Epilepsy, unspecified, not intractable, without status epilepticus: Secondary | ICD-10-CM | POA: Diagnosis not present

## 2019-07-11 DIAGNOSIS — G4733 Obstructive sleep apnea (adult) (pediatric): Secondary | ICD-10-CM | POA: Diagnosis not present

## 2019-07-11 DIAGNOSIS — B952 Enterococcus as the cause of diseases classified elsewhere: Secondary | ICD-10-CM | POA: Diagnosis not present

## 2019-07-11 DIAGNOSIS — I69365 Other paralytic syndrome following cerebral infarction, bilateral: Secondary | ICD-10-CM | POA: Diagnosis not present

## 2019-07-11 DIAGNOSIS — Z1612 Extended spectrum beta lactamase (ESBL) resistance: Secondary | ICD-10-CM | POA: Diagnosis not present

## 2019-07-11 DIAGNOSIS — I10 Essential (primary) hypertension: Secondary | ICD-10-CM | POA: Diagnosis not present

## 2019-07-11 DIAGNOSIS — G9341 Metabolic encephalopathy: Secondary | ICD-10-CM | POA: Diagnosis not present

## 2019-07-11 DIAGNOSIS — D649 Anemia, unspecified: Secondary | ICD-10-CM | POA: Diagnosis not present

## 2019-07-11 DIAGNOSIS — E1165 Type 2 diabetes mellitus with hyperglycemia: Secondary | ICD-10-CM | POA: Diagnosis not present

## 2019-07-11 DIAGNOSIS — R000 Tachycardia, unspecified: Secondary | ICD-10-CM | POA: Diagnosis not present

## 2019-07-12 DIAGNOSIS — R000 Tachycardia, unspecified: Secondary | ICD-10-CM | POA: Diagnosis not present

## 2019-07-12 DIAGNOSIS — E1165 Type 2 diabetes mellitus with hyperglycemia: Secondary | ICD-10-CM | POA: Diagnosis not present

## 2019-07-12 DIAGNOSIS — G4733 Obstructive sleep apnea (adult) (pediatric): Secondary | ICD-10-CM | POA: Diagnosis not present

## 2019-07-12 DIAGNOSIS — G40909 Epilepsy, unspecified, not intractable, without status epilepticus: Secondary | ICD-10-CM | POA: Diagnosis not present

## 2019-07-12 DIAGNOSIS — D649 Anemia, unspecified: Secondary | ICD-10-CM | POA: Diagnosis not present

## 2019-07-12 DIAGNOSIS — B952 Enterococcus as the cause of diseases classified elsewhere: Secondary | ICD-10-CM | POA: Diagnosis not present

## 2019-07-12 DIAGNOSIS — R339 Retention of urine, unspecified: Secondary | ICD-10-CM | POA: Diagnosis not present

## 2019-07-12 DIAGNOSIS — N3000 Acute cystitis without hematuria: Secondary | ICD-10-CM | POA: Diagnosis not present

## 2019-07-12 DIAGNOSIS — Z1612 Extended spectrum beta lactamase (ESBL) resistance: Secondary | ICD-10-CM | POA: Diagnosis not present

## 2019-07-12 DIAGNOSIS — I69320 Aphasia following cerebral infarction: Secondary | ICD-10-CM | POA: Diagnosis not present

## 2019-07-12 DIAGNOSIS — G8250 Quadriplegia, unspecified: Secondary | ICD-10-CM | POA: Diagnosis not present

## 2019-07-12 DIAGNOSIS — L89154 Pressure ulcer of sacral region, stage 4: Secondary | ICD-10-CM | POA: Diagnosis not present

## 2019-07-12 DIAGNOSIS — G9341 Metabolic encephalopathy: Secondary | ICD-10-CM | POA: Diagnosis not present

## 2019-07-12 DIAGNOSIS — I69365 Other paralytic syndrome following cerebral infarction, bilateral: Secondary | ICD-10-CM | POA: Diagnosis not present

## 2019-07-12 DIAGNOSIS — I10 Essential (primary) hypertension: Secondary | ICD-10-CM | POA: Diagnosis not present

## 2019-07-15 DIAGNOSIS — N3000 Acute cystitis without hematuria: Secondary | ICD-10-CM | POA: Diagnosis not present

## 2019-07-15 DIAGNOSIS — G4733 Obstructive sleep apnea (adult) (pediatric): Secondary | ICD-10-CM | POA: Diagnosis not present

## 2019-07-15 DIAGNOSIS — E1165 Type 2 diabetes mellitus with hyperglycemia: Secondary | ICD-10-CM | POA: Diagnosis not present

## 2019-07-15 DIAGNOSIS — L89154 Pressure ulcer of sacral region, stage 4: Secondary | ICD-10-CM | POA: Diagnosis not present

## 2019-07-15 DIAGNOSIS — D649 Anemia, unspecified: Secondary | ICD-10-CM | POA: Diagnosis not present

## 2019-07-15 DIAGNOSIS — G40909 Epilepsy, unspecified, not intractable, without status epilepticus: Secondary | ICD-10-CM | POA: Diagnosis not present

## 2019-07-15 DIAGNOSIS — I69365 Other paralytic syndrome following cerebral infarction, bilateral: Secondary | ICD-10-CM | POA: Diagnosis not present

## 2019-07-15 DIAGNOSIS — I69320 Aphasia following cerebral infarction: Secondary | ICD-10-CM | POA: Diagnosis not present

## 2019-07-15 DIAGNOSIS — G9341 Metabolic encephalopathy: Secondary | ICD-10-CM | POA: Diagnosis not present

## 2019-07-15 DIAGNOSIS — Z1612 Extended spectrum beta lactamase (ESBL) resistance: Secondary | ICD-10-CM | POA: Diagnosis not present

## 2019-07-15 DIAGNOSIS — R000 Tachycardia, unspecified: Secondary | ICD-10-CM | POA: Diagnosis not present

## 2019-07-15 DIAGNOSIS — B952 Enterococcus as the cause of diseases classified elsewhere: Secondary | ICD-10-CM | POA: Diagnosis not present

## 2019-07-15 DIAGNOSIS — G8250 Quadriplegia, unspecified: Secondary | ICD-10-CM | POA: Diagnosis not present

## 2019-07-15 DIAGNOSIS — I10 Essential (primary) hypertension: Secondary | ICD-10-CM | POA: Diagnosis not present

## 2019-07-15 DIAGNOSIS — R339 Retention of urine, unspecified: Secondary | ICD-10-CM | POA: Diagnosis not present

## 2019-07-16 ENCOUNTER — Other Ambulatory Visit: Payer: Self-pay

## 2019-07-16 ENCOUNTER — Other Ambulatory Visit: Payer: BC Managed Care – PPO | Admitting: Adult Health Nurse Practitioner

## 2019-07-16 DIAGNOSIS — G4733 Obstructive sleep apnea (adult) (pediatric): Secondary | ICD-10-CM | POA: Diagnosis not present

## 2019-07-16 DIAGNOSIS — N3000 Acute cystitis without hematuria: Secondary | ICD-10-CM | POA: Diagnosis not present

## 2019-07-16 DIAGNOSIS — I10 Essential (primary) hypertension: Secondary | ICD-10-CM | POA: Diagnosis not present

## 2019-07-16 DIAGNOSIS — R000 Tachycardia, unspecified: Secondary | ICD-10-CM | POA: Diagnosis not present

## 2019-07-16 DIAGNOSIS — E1165 Type 2 diabetes mellitus with hyperglycemia: Secondary | ICD-10-CM | POA: Diagnosis not present

## 2019-07-16 DIAGNOSIS — G8250 Quadriplegia, unspecified: Secondary | ICD-10-CM | POA: Diagnosis not present

## 2019-07-16 DIAGNOSIS — L89154 Pressure ulcer of sacral region, stage 4: Secondary | ICD-10-CM | POA: Diagnosis not present

## 2019-07-16 DIAGNOSIS — I6930 Unspecified sequelae of cerebral infarction: Secondary | ICD-10-CM

## 2019-07-16 DIAGNOSIS — B952 Enterococcus as the cause of diseases classified elsewhere: Secondary | ICD-10-CM | POA: Diagnosis not present

## 2019-07-16 DIAGNOSIS — Z515 Encounter for palliative care: Secondary | ICD-10-CM

## 2019-07-16 DIAGNOSIS — I69320 Aphasia following cerebral infarction: Secondary | ICD-10-CM | POA: Diagnosis not present

## 2019-07-16 DIAGNOSIS — D649 Anemia, unspecified: Secondary | ICD-10-CM | POA: Diagnosis not present

## 2019-07-16 DIAGNOSIS — Z1612 Extended spectrum beta lactamase (ESBL) resistance: Secondary | ICD-10-CM | POA: Diagnosis not present

## 2019-07-16 DIAGNOSIS — G9341 Metabolic encephalopathy: Secondary | ICD-10-CM | POA: Diagnosis not present

## 2019-07-16 DIAGNOSIS — G40909 Epilepsy, unspecified, not intractable, without status epilepticus: Secondary | ICD-10-CM | POA: Diagnosis not present

## 2019-07-16 DIAGNOSIS — I69365 Other paralytic syndrome following cerebral infarction, bilateral: Secondary | ICD-10-CM | POA: Diagnosis not present

## 2019-07-16 DIAGNOSIS — R339 Retention of urine, unspecified: Secondary | ICD-10-CM | POA: Diagnosis not present

## 2019-07-16 NOTE — Progress Notes (Signed)
[facility] Consult Note Telephone: [phone]  Fax: [phone]  PATIENT NAME: Paul Hall DOB: October 30, 1995 MRN: [id number]  PRIMARY CARE PROVIDER:   Ginger Hall  REFERRING PROVIDER:  Cory Munch, PA-C [address] [place],  [place] [address]  RESPONSIBLE PARTY:   Paul Hall, wife [phone] Paul Hall, mother [phone]          RECOMMENDATIONS and PLAN:  1.  Advanced care planning.  Patient is a full code  2.  Quadriplegia post CVA d/t COVID 19 infection.  Patient was treated in hospital for VRE infection on 3/3-06/07/19.  He has since had his foley catheter removed and is voiding well.  He has been working with [employer] therapy and is doing well.  He is now speaking and is able to answer questions appropriately and have a conversation.  Speech is slow but clearly understandable.  He was able to lift his left arm over his head today and is starting to move his legs.  Wife states that his next goal with therapy is to try sitting up on the edge of the bed.  He is able to assist with repositioning and family has been helping him to sit all the way and will prop pillows behind his back to help.  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  Patient has come a long a way in a short period and expect that he will continue to improve.  Continue with home health therapy as ordered.  3.  Sacral wound.  Patient has wound vac to sacral wound with a small amount of bloody drainage noted in the canister.  Wife states that this is improving.  In February the length of the wound was 7.5 cm and depth 5.2 cm.  Measurements yesterday were length 5.5cm, width 2.2cm, and depth 3.3 cm.  Continue home health services for wound care  Patient is doing well overall.  He is eating without difficulty.  Denies pain (except with wound care), fever, cough, SOB, dizziness,  N/V/D.  Did have some constipation due to iron supplementation but this has improved with discontinuation of iron supplement.  Palliative will continue to monitor for symptom management and coordination of care.  Will call in 4-6 weeks for follow up.  Encouraged to call with any concerns.  I spent 50 minutes providing this consultation,  from 3:00 to 3:50  Including time spent with patient/family, chart review, provider coordination, documentation. More than 50% of the time in this consultation was spent coordinating communication.   HISTORY OF PRESENT ILLNESS:  Paul Hall is a 24 y.o. year old male with multiple medical problems including DMT2, CVA with resulting quadraplegia post COVID infection, seizures. Palliative Care was asked to help address goals of care.   CODE STATUS: full code  PPS: 30% HOSPICE ELIGIBILITY/DIAGNOSIS: TBD  PHYSICAL EXAM:  HR 92  O2 99% on RA   General: patient lying in bed in NAD Cardiovascular: regular rate and rhythm Pulmonary: clear ant fields; normal respiratory effort Abdomen: soft, nontender, + bowel sounds Extremities: no edema, no joint deformities Neurological: Patient has quadriplegia related to CVA  PAST MEDICAL HISTORY:  Past Medical History:  Diagnosis Date  . Diabetes mellitus ([place])   . Hypertension   . Stroke [facility])     SOCIAL HX:  Social History   Tobacco Use  . Smoking status: Never Smoker  .  Smokeless tobacco: Never Used  Substance Use Topics  . Alcohol use: No    ALLERGIES:  Allergies  Allergen Reactions  . Morphine Hives    Patient has tolerated oxycodone at high amounts during Nov 2020 hospitalization  . Benzonatate Other (See Comments)     PERTINENT MEDICATIONS:  Outpatient Encounter Medications as of 07/16/2019  Medication Sig  . acetaminophen (TYLENOL) 325 MG tablet Place 2 tablets (650 mg total) into feeding tube every 6 (six) hours as needed for mild pain (or Fever >/= 101). (Patient not taking: Reported on  06/05/2019)  . ascorbic acid (VITAMIN C) 500 MG tablet Take 1 tablet (500 mg total) by mouth 2 (two) times daily.  Marland Kitchen aspirin 81 MG chewable tablet Place 1 tablet (81 mg total) into feeding tube daily.  Marland Kitchen enoxaparin (LOVENOX) 40 MG/0.4ML injection Inject 0.4 mLs (40 mg total) into the skin every 12 (twelve) hours.  . famotidine (PEPCID) 20 MG tablet Place 1 tablet (20 mg total) into feeding tube 2 (two) times daily.  . furosemide (LASIX) 20 MG tablet Place 1 tablet (20 mg total) into feeding tube daily.  . insulin aspart (NOVOLOG) 100 UNIT/ML FlexPen Inject 10 Units into the skin every 6 (six) hours. (Patient taking differently: Inject 50 Units into the skin every 6 (six) hours. )  . Insulin Glargine (LANTUS) 100 UNIT/ML Solostar Pen Inject 40 Units into the skin 2 (two) times daily. (Patient taking differently: Inject 42 Units into the skin 2 (two) times daily. )  . Insulin Pen Needle 29G X MISC 1 Device by Does not apply route 2 (two) times daily.  Marland Kitchen levETIRAcetam (KEPPRA) 100 MG/ML solution Place 10 mLs (1,000 mg total) into feeding tube 2 (two) times daily. (Patient taking differently: Place 250 mg into feeding tube 2 (two) times daily. )  . linezolid (ZYVOX) 600 MG tablet Take 1 tablet (600 mg total) by mouth 2 (two) times daily.  Marland Kitchen loperamide (IMODIUM) 2 MG capsule Take 2 capsules (4 mg total) by mouth every 8 (eight) hours as needed for diarrhea or loose stools. (Patient not taking: Reported on 06/05/2019)  . magnesium oxide (MAG-OX) 400 MG tablet Place 400 mg into feeding tube daily.   . metoprolol tartrate (LOPRESSOR) 25 MG tablet Place 1 tablet (25 mg total) into feeding tube 3 (three) times daily.  . nutrition supplement, JUVEN, (JUVEN) PACK Place 1 packet into feeding tube 2 (two) times daily between meals.  . Nutritional Supplements (FEEDING SUPPLEMENT, GLUCERNA 1.5 CAL,) LIQD Place 1,000 mLs into feeding tube continuous.  . Water For Irrigation, Sterile (FREE WATER) SOLN Place 100 mLs  into feeding tube every 4 (four) hours.   No facility-administered encounter medications on file as of 07/16/2019.     Paul Hall Marlena Clipper, NP

## 2019-07-17 DIAGNOSIS — R000 Tachycardia, unspecified: Secondary | ICD-10-CM | POA: Diagnosis not present

## 2019-07-17 DIAGNOSIS — G9341 Metabolic encephalopathy: Secondary | ICD-10-CM | POA: Diagnosis not present

## 2019-07-17 DIAGNOSIS — B952 Enterococcus as the cause of diseases classified elsewhere: Secondary | ICD-10-CM | POA: Diagnosis not present

## 2019-07-17 DIAGNOSIS — I69365 Other paralytic syndrome following cerebral infarction, bilateral: Secondary | ICD-10-CM | POA: Diagnosis not present

## 2019-07-17 DIAGNOSIS — G4733 Obstructive sleep apnea (adult) (pediatric): Secondary | ICD-10-CM | POA: Diagnosis not present

## 2019-07-17 DIAGNOSIS — G8250 Quadriplegia, unspecified: Secondary | ICD-10-CM | POA: Diagnosis not present

## 2019-07-17 DIAGNOSIS — G40909 Epilepsy, unspecified, not intractable, without status epilepticus: Secondary | ICD-10-CM | POA: Diagnosis not present

## 2019-07-17 DIAGNOSIS — N3000 Acute cystitis without hematuria: Secondary | ICD-10-CM | POA: Diagnosis not present

## 2019-07-17 DIAGNOSIS — I69320 Aphasia following cerebral infarction: Secondary | ICD-10-CM | POA: Diagnosis not present

## 2019-07-17 DIAGNOSIS — D649 Anemia, unspecified: Secondary | ICD-10-CM | POA: Diagnosis not present

## 2019-07-17 DIAGNOSIS — Z1612 Extended spectrum beta lactamase (ESBL) resistance: Secondary | ICD-10-CM | POA: Diagnosis not present

## 2019-07-17 DIAGNOSIS — R339 Retention of urine, unspecified: Secondary | ICD-10-CM | POA: Diagnosis not present

## 2019-07-17 DIAGNOSIS — E1165 Type 2 diabetes mellitus with hyperglycemia: Secondary | ICD-10-CM | POA: Diagnosis not present

## 2019-07-17 DIAGNOSIS — I10 Essential (primary) hypertension: Secondary | ICD-10-CM | POA: Diagnosis not present

## 2019-07-17 DIAGNOSIS — L89154 Pressure ulcer of sacral region, stage 4: Secondary | ICD-10-CM | POA: Diagnosis not present

## 2019-07-18 DIAGNOSIS — R339 Retention of urine, unspecified: Secondary | ICD-10-CM | POA: Diagnosis not present

## 2019-07-18 DIAGNOSIS — I10 Essential (primary) hypertension: Secondary | ICD-10-CM | POA: Diagnosis not present

## 2019-07-18 DIAGNOSIS — R000 Tachycardia, unspecified: Secondary | ICD-10-CM | POA: Diagnosis not present

## 2019-07-18 DIAGNOSIS — D649 Anemia, unspecified: Secondary | ICD-10-CM | POA: Diagnosis not present

## 2019-07-18 DIAGNOSIS — I69365 Other paralytic syndrome following cerebral infarction, bilateral: Secondary | ICD-10-CM | POA: Diagnosis not present

## 2019-07-18 DIAGNOSIS — Z1612 Extended spectrum beta lactamase (ESBL) resistance: Secondary | ICD-10-CM | POA: Diagnosis not present

## 2019-07-18 DIAGNOSIS — E1165 Type 2 diabetes mellitus with hyperglycemia: Secondary | ICD-10-CM | POA: Diagnosis not present

## 2019-07-18 DIAGNOSIS — N3000 Acute cystitis without hematuria: Secondary | ICD-10-CM | POA: Diagnosis not present

## 2019-07-18 DIAGNOSIS — I69320 Aphasia following cerebral infarction: Secondary | ICD-10-CM | POA: Diagnosis not present

## 2019-07-18 DIAGNOSIS — G9341 Metabolic encephalopathy: Secondary | ICD-10-CM | POA: Diagnosis not present

## 2019-07-18 DIAGNOSIS — G4733 Obstructive sleep apnea (adult) (pediatric): Secondary | ICD-10-CM | POA: Diagnosis not present

## 2019-07-18 DIAGNOSIS — B952 Enterococcus as the cause of diseases classified elsewhere: Secondary | ICD-10-CM | POA: Diagnosis not present

## 2019-07-18 DIAGNOSIS — L89154 Pressure ulcer of sacral region, stage 4: Secondary | ICD-10-CM | POA: Diagnosis not present

## 2019-07-18 DIAGNOSIS — G40909 Epilepsy, unspecified, not intractable, without status epilepticus: Secondary | ICD-10-CM | POA: Diagnosis not present

## 2019-07-18 DIAGNOSIS — G8250 Quadriplegia, unspecified: Secondary | ICD-10-CM | POA: Diagnosis not present

## 2019-07-19 DIAGNOSIS — D649 Anemia, unspecified: Secondary | ICD-10-CM | POA: Diagnosis not present

## 2019-07-19 DIAGNOSIS — G4733 Obstructive sleep apnea (adult) (pediatric): Secondary | ICD-10-CM | POA: Diagnosis not present

## 2019-07-19 DIAGNOSIS — E1165 Type 2 diabetes mellitus with hyperglycemia: Secondary | ICD-10-CM | POA: Diagnosis not present

## 2019-07-19 DIAGNOSIS — G40909 Epilepsy, unspecified, not intractable, without status epilepticus: Secondary | ICD-10-CM | POA: Diagnosis not present

## 2019-07-19 DIAGNOSIS — I69365 Other paralytic syndrome following cerebral infarction, bilateral: Secondary | ICD-10-CM | POA: Diagnosis not present

## 2019-07-19 DIAGNOSIS — B952 Enterococcus as the cause of diseases classified elsewhere: Secondary | ICD-10-CM | POA: Diagnosis not present

## 2019-07-19 DIAGNOSIS — N3000 Acute cystitis without hematuria: Secondary | ICD-10-CM | POA: Diagnosis not present

## 2019-07-19 DIAGNOSIS — I10 Essential (primary) hypertension: Secondary | ICD-10-CM | POA: Diagnosis not present

## 2019-07-19 DIAGNOSIS — R339 Retention of urine, unspecified: Secondary | ICD-10-CM | POA: Diagnosis not present

## 2019-07-19 DIAGNOSIS — R000 Tachycardia, unspecified: Secondary | ICD-10-CM | POA: Diagnosis not present

## 2019-07-19 DIAGNOSIS — I69320 Aphasia following cerebral infarction: Secondary | ICD-10-CM | POA: Diagnosis not present

## 2019-07-19 DIAGNOSIS — L89154 Pressure ulcer of sacral region, stage 4: Secondary | ICD-10-CM | POA: Diagnosis not present

## 2019-07-19 DIAGNOSIS — Z1612 Extended spectrum beta lactamase (ESBL) resistance: Secondary | ICD-10-CM | POA: Diagnosis not present

## 2019-07-19 DIAGNOSIS — G9341 Metabolic encephalopathy: Secondary | ICD-10-CM | POA: Diagnosis not present

## 2019-07-19 DIAGNOSIS — G8250 Quadriplegia, unspecified: Secondary | ICD-10-CM | POA: Diagnosis not present

## 2019-07-22 DIAGNOSIS — G9341 Metabolic encephalopathy: Secondary | ICD-10-CM | POA: Diagnosis not present

## 2019-07-22 DIAGNOSIS — Z1612 Extended spectrum beta lactamase (ESBL) resistance: Secondary | ICD-10-CM | POA: Diagnosis not present

## 2019-07-22 DIAGNOSIS — G4733 Obstructive sleep apnea (adult) (pediatric): Secondary | ICD-10-CM | POA: Diagnosis not present

## 2019-07-22 DIAGNOSIS — B952 Enterococcus as the cause of diseases classified elsewhere: Secondary | ICD-10-CM | POA: Diagnosis not present

## 2019-07-22 DIAGNOSIS — I69365 Other paralytic syndrome following cerebral infarction, bilateral: Secondary | ICD-10-CM | POA: Diagnosis not present

## 2019-07-22 DIAGNOSIS — N3000 Acute cystitis without hematuria: Secondary | ICD-10-CM | POA: Diagnosis not present

## 2019-07-22 DIAGNOSIS — D649 Anemia, unspecified: Secondary | ICD-10-CM | POA: Diagnosis not present

## 2019-07-22 DIAGNOSIS — R339 Retention of urine, unspecified: Secondary | ICD-10-CM | POA: Diagnosis not present

## 2019-07-22 DIAGNOSIS — G40909 Epilepsy, unspecified, not intractable, without status epilepticus: Secondary | ICD-10-CM | POA: Diagnosis not present

## 2019-07-22 DIAGNOSIS — I69320 Aphasia following cerebral infarction: Secondary | ICD-10-CM | POA: Diagnosis not present

## 2019-07-22 DIAGNOSIS — G8250 Quadriplegia, unspecified: Secondary | ICD-10-CM | POA: Diagnosis not present

## 2019-07-22 DIAGNOSIS — I10 Essential (primary) hypertension: Secondary | ICD-10-CM | POA: Diagnosis not present

## 2019-07-22 DIAGNOSIS — L89154 Pressure ulcer of sacral region, stage 4: Secondary | ICD-10-CM | POA: Diagnosis not present

## 2019-07-22 DIAGNOSIS — R000 Tachycardia, unspecified: Secondary | ICD-10-CM | POA: Diagnosis not present

## 2019-07-22 DIAGNOSIS — E1165 Type 2 diabetes mellitus with hyperglycemia: Secondary | ICD-10-CM | POA: Diagnosis not present

## 2019-07-23 DIAGNOSIS — R000 Tachycardia, unspecified: Secondary | ICD-10-CM | POA: Diagnosis not present

## 2019-07-23 DIAGNOSIS — L89154 Pressure ulcer of sacral region, stage 4: Secondary | ICD-10-CM | POA: Diagnosis not present

## 2019-07-23 DIAGNOSIS — G8250 Quadriplegia, unspecified: Secondary | ICD-10-CM | POA: Diagnosis not present

## 2019-07-23 DIAGNOSIS — I69365 Other paralytic syndrome following cerebral infarction, bilateral: Secondary | ICD-10-CM | POA: Diagnosis not present

## 2019-07-23 DIAGNOSIS — G9341 Metabolic encephalopathy: Secondary | ICD-10-CM | POA: Diagnosis not present

## 2019-07-23 DIAGNOSIS — I10 Essential (primary) hypertension: Secondary | ICD-10-CM | POA: Diagnosis not present

## 2019-07-23 DIAGNOSIS — D649 Anemia, unspecified: Secondary | ICD-10-CM | POA: Diagnosis not present

## 2019-07-23 DIAGNOSIS — R339 Retention of urine, unspecified: Secondary | ICD-10-CM | POA: Diagnosis not present

## 2019-07-23 DIAGNOSIS — G40909 Epilepsy, unspecified, not intractable, without status epilepticus: Secondary | ICD-10-CM | POA: Diagnosis not present

## 2019-07-23 DIAGNOSIS — B952 Enterococcus as the cause of diseases classified elsewhere: Secondary | ICD-10-CM | POA: Diagnosis not present

## 2019-07-23 DIAGNOSIS — I69320 Aphasia following cerebral infarction: Secondary | ICD-10-CM | POA: Diagnosis not present

## 2019-07-23 DIAGNOSIS — E1165 Type 2 diabetes mellitus with hyperglycemia: Secondary | ICD-10-CM | POA: Diagnosis not present

## 2019-07-23 DIAGNOSIS — Z1612 Extended spectrum beta lactamase (ESBL) resistance: Secondary | ICD-10-CM | POA: Diagnosis not present

## 2019-07-23 DIAGNOSIS — G4733 Obstructive sleep apnea (adult) (pediatric): Secondary | ICD-10-CM | POA: Diagnosis not present

## 2019-07-23 DIAGNOSIS — N3000 Acute cystitis without hematuria: Secondary | ICD-10-CM | POA: Diagnosis not present

## 2019-07-24 DIAGNOSIS — R339 Retention of urine, unspecified: Secondary | ICD-10-CM | POA: Diagnosis not present

## 2019-07-24 DIAGNOSIS — G4733 Obstructive sleep apnea (adult) (pediatric): Secondary | ICD-10-CM | POA: Diagnosis not present

## 2019-07-24 DIAGNOSIS — G8250 Quadriplegia, unspecified: Secondary | ICD-10-CM | POA: Diagnosis not present

## 2019-07-24 DIAGNOSIS — E1165 Type 2 diabetes mellitus with hyperglycemia: Secondary | ICD-10-CM | POA: Diagnosis not present

## 2019-07-24 DIAGNOSIS — D649 Anemia, unspecified: Secondary | ICD-10-CM | POA: Diagnosis not present

## 2019-07-24 DIAGNOSIS — R000 Tachycardia, unspecified: Secondary | ICD-10-CM | POA: Diagnosis not present

## 2019-07-24 DIAGNOSIS — L89154 Pressure ulcer of sacral region, stage 4: Secondary | ICD-10-CM | POA: Diagnosis not present

## 2019-07-24 DIAGNOSIS — G40909 Epilepsy, unspecified, not intractable, without status epilepticus: Secondary | ICD-10-CM | POA: Diagnosis not present

## 2019-07-24 DIAGNOSIS — N3000 Acute cystitis without hematuria: Secondary | ICD-10-CM | POA: Diagnosis not present

## 2019-07-24 DIAGNOSIS — Z1612 Extended spectrum beta lactamase (ESBL) resistance: Secondary | ICD-10-CM | POA: Diagnosis not present

## 2019-07-24 DIAGNOSIS — I69365 Other paralytic syndrome following cerebral infarction, bilateral: Secondary | ICD-10-CM | POA: Diagnosis not present

## 2019-07-24 DIAGNOSIS — I10 Essential (primary) hypertension: Secondary | ICD-10-CM | POA: Diagnosis not present

## 2019-07-24 DIAGNOSIS — B952 Enterococcus as the cause of diseases classified elsewhere: Secondary | ICD-10-CM | POA: Diagnosis not present

## 2019-07-24 DIAGNOSIS — G9341 Metabolic encephalopathy: Secondary | ICD-10-CM | POA: Diagnosis not present

## 2019-07-24 DIAGNOSIS — I69320 Aphasia following cerebral infarction: Secondary | ICD-10-CM | POA: Diagnosis not present

## 2019-07-25 DIAGNOSIS — I69365 Other paralytic syndrome following cerebral infarction, bilateral: Secondary | ICD-10-CM | POA: Diagnosis not present

## 2019-07-25 DIAGNOSIS — I6389 Other cerebral infarction: Secondary | ICD-10-CM | POA: Diagnosis not present

## 2019-07-25 DIAGNOSIS — G4733 Obstructive sleep apnea (adult) (pediatric): Secondary | ICD-10-CM | POA: Diagnosis not present

## 2019-07-25 DIAGNOSIS — E1165 Type 2 diabetes mellitus with hyperglycemia: Secondary | ICD-10-CM | POA: Diagnosis not present

## 2019-07-25 DIAGNOSIS — G9341 Metabolic encephalopathy: Secondary | ICD-10-CM | POA: Diagnosis not present

## 2019-07-25 DIAGNOSIS — L8994 Pressure ulcer of unspecified site, stage 4: Secondary | ICD-10-CM | POA: Diagnosis not present

## 2019-07-25 DIAGNOSIS — D649 Anemia, unspecified: Secondary | ICD-10-CM | POA: Diagnosis not present

## 2019-07-25 DIAGNOSIS — G8250 Quadriplegia, unspecified: Secondary | ICD-10-CM | POA: Diagnosis not present

## 2019-07-25 DIAGNOSIS — G40909 Epilepsy, unspecified, not intractable, without status epilepticus: Secondary | ICD-10-CM | POA: Diagnosis not present

## 2019-07-25 DIAGNOSIS — I69320 Aphasia following cerebral infarction: Secondary | ICD-10-CM | POA: Diagnosis not present

## 2019-07-25 DIAGNOSIS — R339 Retention of urine, unspecified: Secondary | ICD-10-CM | POA: Diagnosis not present

## 2019-07-25 DIAGNOSIS — B952 Enterococcus as the cause of diseases classified elsewhere: Secondary | ICD-10-CM | POA: Diagnosis not present

## 2019-07-25 DIAGNOSIS — N3000 Acute cystitis without hematuria: Secondary | ICD-10-CM | POA: Diagnosis not present

## 2019-07-25 DIAGNOSIS — L89154 Pressure ulcer of sacral region, stage 4: Secondary | ICD-10-CM | POA: Diagnosis not present

## 2019-07-25 DIAGNOSIS — Z1612 Extended spectrum beta lactamase (ESBL) resistance: Secondary | ICD-10-CM | POA: Diagnosis not present

## 2019-07-25 DIAGNOSIS — I10 Essential (primary) hypertension: Secondary | ICD-10-CM | POA: Diagnosis not present

## 2019-07-25 DIAGNOSIS — R000 Tachycardia, unspecified: Secondary | ICD-10-CM | POA: Diagnosis not present

## 2019-07-26 DIAGNOSIS — I69320 Aphasia following cerebral infarction: Secondary | ICD-10-CM | POA: Diagnosis not present

## 2019-07-26 DIAGNOSIS — B952 Enterococcus as the cause of diseases classified elsewhere: Secondary | ICD-10-CM | POA: Diagnosis not present

## 2019-07-26 DIAGNOSIS — G8250 Quadriplegia, unspecified: Secondary | ICD-10-CM | POA: Diagnosis not present

## 2019-07-26 DIAGNOSIS — G40909 Epilepsy, unspecified, not intractable, without status epilepticus: Secondary | ICD-10-CM | POA: Diagnosis not present

## 2019-07-26 DIAGNOSIS — D649 Anemia, unspecified: Secondary | ICD-10-CM | POA: Diagnosis not present

## 2019-07-26 DIAGNOSIS — G9341 Metabolic encephalopathy: Secondary | ICD-10-CM | POA: Diagnosis not present

## 2019-07-26 DIAGNOSIS — Z1612 Extended spectrum beta lactamase (ESBL) resistance: Secondary | ICD-10-CM | POA: Diagnosis not present

## 2019-07-26 DIAGNOSIS — R000 Tachycardia, unspecified: Secondary | ICD-10-CM | POA: Diagnosis not present

## 2019-07-26 DIAGNOSIS — N3000 Acute cystitis without hematuria: Secondary | ICD-10-CM | POA: Diagnosis not present

## 2019-07-26 DIAGNOSIS — R339 Retention of urine, unspecified: Secondary | ICD-10-CM | POA: Diagnosis not present

## 2019-07-26 DIAGNOSIS — L89154 Pressure ulcer of sacral region, stage 4: Secondary | ICD-10-CM | POA: Diagnosis not present

## 2019-07-26 DIAGNOSIS — I69365 Other paralytic syndrome following cerebral infarction, bilateral: Secondary | ICD-10-CM | POA: Diagnosis not present

## 2019-07-26 DIAGNOSIS — G4733 Obstructive sleep apnea (adult) (pediatric): Secondary | ICD-10-CM | POA: Diagnosis not present

## 2019-07-26 DIAGNOSIS — I10 Essential (primary) hypertension: Secondary | ICD-10-CM | POA: Diagnosis not present

## 2019-07-26 DIAGNOSIS — E1165 Type 2 diabetes mellitus with hyperglycemia: Secondary | ICD-10-CM | POA: Diagnosis not present

## 2019-07-29 DIAGNOSIS — I69365 Other paralytic syndrome following cerebral infarction, bilateral: Secondary | ICD-10-CM | POA: Diagnosis not present

## 2019-07-29 DIAGNOSIS — N3000 Acute cystitis without hematuria: Secondary | ICD-10-CM | POA: Diagnosis not present

## 2019-07-29 DIAGNOSIS — G4733 Obstructive sleep apnea (adult) (pediatric): Secondary | ICD-10-CM | POA: Diagnosis not present

## 2019-07-29 DIAGNOSIS — I10 Essential (primary) hypertension: Secondary | ICD-10-CM | POA: Diagnosis not present

## 2019-07-29 DIAGNOSIS — D649 Anemia, unspecified: Secondary | ICD-10-CM | POA: Diagnosis not present

## 2019-07-29 DIAGNOSIS — I6389 Other cerebral infarction: Secondary | ICD-10-CM | POA: Diagnosis not present

## 2019-07-29 DIAGNOSIS — G9341 Metabolic encephalopathy: Secondary | ICD-10-CM | POA: Diagnosis not present

## 2019-07-29 DIAGNOSIS — G40909 Epilepsy, unspecified, not intractable, without status epilepticus: Secondary | ICD-10-CM | POA: Diagnosis not present

## 2019-07-29 DIAGNOSIS — G8250 Quadriplegia, unspecified: Secondary | ICD-10-CM | POA: Diagnosis not present

## 2019-07-29 DIAGNOSIS — L8994 Pressure ulcer of unspecified site, stage 4: Secondary | ICD-10-CM | POA: Diagnosis not present

## 2019-07-29 DIAGNOSIS — E1165 Type 2 diabetes mellitus with hyperglycemia: Secondary | ICD-10-CM | POA: Diagnosis not present

## 2019-07-29 DIAGNOSIS — B952 Enterococcus as the cause of diseases classified elsewhere: Secondary | ICD-10-CM | POA: Diagnosis not present

## 2019-07-29 DIAGNOSIS — R339 Retention of urine, unspecified: Secondary | ICD-10-CM | POA: Diagnosis not present

## 2019-07-29 DIAGNOSIS — L89154 Pressure ulcer of sacral region, stage 4: Secondary | ICD-10-CM | POA: Diagnosis not present

## 2019-07-29 DIAGNOSIS — R000 Tachycardia, unspecified: Secondary | ICD-10-CM | POA: Diagnosis not present

## 2019-07-29 DIAGNOSIS — Z1612 Extended spectrum beta lactamase (ESBL) resistance: Secondary | ICD-10-CM | POA: Diagnosis not present

## 2019-07-29 DIAGNOSIS — I69320 Aphasia following cerebral infarction: Secondary | ICD-10-CM | POA: Diagnosis not present

## 2019-07-30 DIAGNOSIS — I69365 Other paralytic syndrome following cerebral infarction, bilateral: Secondary | ICD-10-CM | POA: Diagnosis not present

## 2019-07-30 DIAGNOSIS — T8189XA Other complications of procedures, not elsewhere classified, initial encounter: Secondary | ICD-10-CM | POA: Diagnosis not present

## 2019-07-30 DIAGNOSIS — E1165 Type 2 diabetes mellitus with hyperglycemia: Secondary | ICD-10-CM | POA: Diagnosis not present

## 2019-07-30 DIAGNOSIS — Z1612 Extended spectrum beta lactamase (ESBL) resistance: Secondary | ICD-10-CM | POA: Diagnosis not present

## 2019-07-30 DIAGNOSIS — G40909 Epilepsy, unspecified, not intractable, without status epilepticus: Secondary | ICD-10-CM | POA: Diagnosis not present

## 2019-07-30 DIAGNOSIS — L89154 Pressure ulcer of sacral region, stage 4: Secondary | ICD-10-CM | POA: Diagnosis not present

## 2019-07-30 DIAGNOSIS — R000 Tachycardia, unspecified: Secondary | ICD-10-CM | POA: Diagnosis not present

## 2019-07-30 DIAGNOSIS — R339 Retention of urine, unspecified: Secondary | ICD-10-CM | POA: Diagnosis not present

## 2019-07-30 DIAGNOSIS — B952 Enterococcus as the cause of diseases classified elsewhere: Secondary | ICD-10-CM | POA: Diagnosis not present

## 2019-07-30 DIAGNOSIS — N3000 Acute cystitis without hematuria: Secondary | ICD-10-CM | POA: Diagnosis not present

## 2019-07-30 DIAGNOSIS — D649 Anemia, unspecified: Secondary | ICD-10-CM | POA: Diagnosis not present

## 2019-07-30 DIAGNOSIS — G4733 Obstructive sleep apnea (adult) (pediatric): Secondary | ICD-10-CM | POA: Diagnosis not present

## 2019-07-30 DIAGNOSIS — I69320 Aphasia following cerebral infarction: Secondary | ICD-10-CM | POA: Diagnosis not present

## 2019-07-30 DIAGNOSIS — I10 Essential (primary) hypertension: Secondary | ICD-10-CM | POA: Diagnosis not present

## 2019-07-30 DIAGNOSIS — G9341 Metabolic encephalopathy: Secondary | ICD-10-CM | POA: Diagnosis not present

## 2019-07-30 DIAGNOSIS — G8250 Quadriplegia, unspecified: Secondary | ICD-10-CM | POA: Diagnosis not present

## 2019-07-31 DIAGNOSIS — I69320 Aphasia following cerebral infarction: Secondary | ICD-10-CM | POA: Diagnosis not present

## 2019-07-31 DIAGNOSIS — R339 Retention of urine, unspecified: Secondary | ICD-10-CM | POA: Diagnosis not present

## 2019-07-31 DIAGNOSIS — E1165 Type 2 diabetes mellitus with hyperglycemia: Secondary | ICD-10-CM | POA: Diagnosis not present

## 2019-07-31 DIAGNOSIS — N3000 Acute cystitis without hematuria: Secondary | ICD-10-CM | POA: Diagnosis not present

## 2019-07-31 DIAGNOSIS — I10 Essential (primary) hypertension: Secondary | ICD-10-CM | POA: Diagnosis not present

## 2019-07-31 DIAGNOSIS — G9341 Metabolic encephalopathy: Secondary | ICD-10-CM | POA: Diagnosis not present

## 2019-07-31 DIAGNOSIS — B952 Enterococcus as the cause of diseases classified elsewhere: Secondary | ICD-10-CM | POA: Diagnosis not present

## 2019-07-31 DIAGNOSIS — D649 Anemia, unspecified: Secondary | ICD-10-CM | POA: Diagnosis not present

## 2019-07-31 DIAGNOSIS — I69365 Other paralytic syndrome following cerebral infarction, bilateral: Secondary | ICD-10-CM | POA: Diagnosis not present

## 2019-07-31 DIAGNOSIS — L89154 Pressure ulcer of sacral region, stage 4: Secondary | ICD-10-CM | POA: Diagnosis not present

## 2019-07-31 DIAGNOSIS — R000 Tachycardia, unspecified: Secondary | ICD-10-CM | POA: Diagnosis not present

## 2019-07-31 DIAGNOSIS — G8250 Quadriplegia, unspecified: Secondary | ICD-10-CM | POA: Diagnosis not present

## 2019-07-31 DIAGNOSIS — Z1612 Extended spectrum beta lactamase (ESBL) resistance: Secondary | ICD-10-CM | POA: Diagnosis not present

## 2019-07-31 DIAGNOSIS — G4733 Obstructive sleep apnea (adult) (pediatric): Secondary | ICD-10-CM | POA: Diagnosis not present

## 2019-07-31 DIAGNOSIS — G40909 Epilepsy, unspecified, not intractable, without status epilepticus: Secondary | ICD-10-CM | POA: Diagnosis not present

## 2019-08-01 DIAGNOSIS — I69320 Aphasia following cerebral infarction: Secondary | ICD-10-CM | POA: Diagnosis not present

## 2019-08-01 DIAGNOSIS — Z1612 Extended spectrum beta lactamase (ESBL) resistance: Secondary | ICD-10-CM | POA: Diagnosis not present

## 2019-08-01 DIAGNOSIS — G8250 Quadriplegia, unspecified: Secondary | ICD-10-CM | POA: Diagnosis not present

## 2019-08-01 DIAGNOSIS — G4733 Obstructive sleep apnea (adult) (pediatric): Secondary | ICD-10-CM | POA: Diagnosis not present

## 2019-08-01 DIAGNOSIS — I69365 Other paralytic syndrome following cerebral infarction, bilateral: Secondary | ICD-10-CM | POA: Diagnosis not present

## 2019-08-01 DIAGNOSIS — B952 Enterococcus as the cause of diseases classified elsewhere: Secondary | ICD-10-CM | POA: Diagnosis not present

## 2019-08-01 DIAGNOSIS — R339 Retention of urine, unspecified: Secondary | ICD-10-CM | POA: Diagnosis not present

## 2019-08-01 DIAGNOSIS — G9341 Metabolic encephalopathy: Secondary | ICD-10-CM | POA: Diagnosis not present

## 2019-08-01 DIAGNOSIS — E1165 Type 2 diabetes mellitus with hyperglycemia: Secondary | ICD-10-CM | POA: Diagnosis not present

## 2019-08-01 DIAGNOSIS — D649 Anemia, unspecified: Secondary | ICD-10-CM | POA: Diagnosis not present

## 2019-08-01 DIAGNOSIS — G40909 Epilepsy, unspecified, not intractable, without status epilepticus: Secondary | ICD-10-CM | POA: Diagnosis not present

## 2019-08-01 DIAGNOSIS — L89154 Pressure ulcer of sacral region, stage 4: Secondary | ICD-10-CM | POA: Diagnosis not present

## 2019-08-01 DIAGNOSIS — I10 Essential (primary) hypertension: Secondary | ICD-10-CM | POA: Diagnosis not present

## 2019-08-01 DIAGNOSIS — R000 Tachycardia, unspecified: Secondary | ICD-10-CM | POA: Diagnosis not present

## 2019-08-01 DIAGNOSIS — N3000 Acute cystitis without hematuria: Secondary | ICD-10-CM | POA: Diagnosis not present

## 2019-08-02 DIAGNOSIS — B952 Enterococcus as the cause of diseases classified elsewhere: Secondary | ICD-10-CM | POA: Diagnosis not present

## 2019-08-02 DIAGNOSIS — G9341 Metabolic encephalopathy: Secondary | ICD-10-CM | POA: Diagnosis not present

## 2019-08-02 DIAGNOSIS — Z1612 Extended spectrum beta lactamase (ESBL) resistance: Secondary | ICD-10-CM | POA: Diagnosis not present

## 2019-08-02 DIAGNOSIS — G40909 Epilepsy, unspecified, not intractable, without status epilepticus: Secondary | ICD-10-CM | POA: Diagnosis not present

## 2019-08-02 DIAGNOSIS — G8250 Quadriplegia, unspecified: Secondary | ICD-10-CM | POA: Diagnosis not present

## 2019-08-02 DIAGNOSIS — L89154 Pressure ulcer of sacral region, stage 4: Secondary | ICD-10-CM | POA: Diagnosis not present

## 2019-08-02 DIAGNOSIS — R000 Tachycardia, unspecified: Secondary | ICD-10-CM | POA: Diagnosis not present

## 2019-08-02 DIAGNOSIS — G4733 Obstructive sleep apnea (adult) (pediatric): Secondary | ICD-10-CM | POA: Diagnosis not present

## 2019-08-02 DIAGNOSIS — E1165 Type 2 diabetes mellitus with hyperglycemia: Secondary | ICD-10-CM | POA: Diagnosis not present

## 2019-08-02 DIAGNOSIS — N3000 Acute cystitis without hematuria: Secondary | ICD-10-CM | POA: Diagnosis not present

## 2019-08-02 DIAGNOSIS — D649 Anemia, unspecified: Secondary | ICD-10-CM | POA: Diagnosis not present

## 2019-08-02 DIAGNOSIS — I69320 Aphasia following cerebral infarction: Secondary | ICD-10-CM | POA: Diagnosis not present

## 2019-08-02 DIAGNOSIS — I10 Essential (primary) hypertension: Secondary | ICD-10-CM | POA: Diagnosis not present

## 2019-08-02 DIAGNOSIS — R339 Retention of urine, unspecified: Secondary | ICD-10-CM | POA: Diagnosis not present

## 2019-08-02 DIAGNOSIS — I69365 Other paralytic syndrome following cerebral infarction, bilateral: Secondary | ICD-10-CM | POA: Diagnosis not present

## 2019-08-03 DIAGNOSIS — T8189XA Other complications of procedures, not elsewhere classified, initial encounter: Secondary | ICD-10-CM | POA: Diagnosis not present

## 2019-08-03 DIAGNOSIS — L89154 Pressure ulcer of sacral region, stage 4: Secondary | ICD-10-CM | POA: Diagnosis not present

## 2019-08-05 DIAGNOSIS — Z7401 Bed confinement status: Secondary | ICD-10-CM | POA: Diagnosis not present

## 2019-08-05 DIAGNOSIS — D649 Anemia, unspecified: Secondary | ICD-10-CM | POA: Diagnosis not present

## 2019-08-05 DIAGNOSIS — Z86718 Personal history of other venous thrombosis and embolism: Secondary | ICD-10-CM | POA: Diagnosis not present

## 2019-08-05 DIAGNOSIS — R339 Retention of urine, unspecified: Secondary | ICD-10-CM | POA: Diagnosis not present

## 2019-08-05 DIAGNOSIS — G40909 Epilepsy, unspecified, not intractable, without status epilepticus: Secondary | ICD-10-CM | POA: Diagnosis not present

## 2019-08-05 DIAGNOSIS — G4733 Obstructive sleep apnea (adult) (pediatric): Secondary | ICD-10-CM | POA: Diagnosis not present

## 2019-08-05 DIAGNOSIS — G8250 Quadriplegia, unspecified: Secondary | ICD-10-CM | POA: Diagnosis not present

## 2019-08-05 DIAGNOSIS — Z87440 Personal history of urinary (tract) infections: Secondary | ICD-10-CM | POA: Diagnosis not present

## 2019-08-05 DIAGNOSIS — L89154 Pressure ulcer of sacral region, stage 4: Secondary | ICD-10-CM | POA: Diagnosis not present

## 2019-08-05 DIAGNOSIS — I10 Essential (primary) hypertension: Secondary | ICD-10-CM | POA: Diagnosis not present

## 2019-08-05 DIAGNOSIS — Z8616 Personal history of COVID-19: Secondary | ICD-10-CM | POA: Diagnosis not present

## 2019-08-05 DIAGNOSIS — I69365 Other paralytic syndrome following cerebral infarction, bilateral: Secondary | ICD-10-CM | POA: Diagnosis not present

## 2019-08-05 DIAGNOSIS — R000 Tachycardia, unspecified: Secondary | ICD-10-CM | POA: Diagnosis not present

## 2019-08-05 DIAGNOSIS — E1165 Type 2 diabetes mellitus with hyperglycemia: Secondary | ICD-10-CM | POA: Diagnosis not present

## 2019-08-05 DIAGNOSIS — I69320 Aphasia following cerebral infarction: Secondary | ICD-10-CM | POA: Diagnosis not present

## 2019-08-06 DIAGNOSIS — I69320 Aphasia following cerebral infarction: Secondary | ICD-10-CM | POA: Diagnosis not present

## 2019-08-06 DIAGNOSIS — Z7401 Bed confinement status: Secondary | ICD-10-CM | POA: Diagnosis not present

## 2019-08-06 DIAGNOSIS — I10 Essential (primary) hypertension: Secondary | ICD-10-CM | POA: Diagnosis not present

## 2019-08-06 DIAGNOSIS — G8250 Quadriplegia, unspecified: Secondary | ICD-10-CM | POA: Diagnosis not present

## 2019-08-06 DIAGNOSIS — D649 Anemia, unspecified: Secondary | ICD-10-CM | POA: Diagnosis not present

## 2019-08-06 DIAGNOSIS — G4733 Obstructive sleep apnea (adult) (pediatric): Secondary | ICD-10-CM | POA: Diagnosis not present

## 2019-08-06 DIAGNOSIS — G40909 Epilepsy, unspecified, not intractable, without status epilepticus: Secondary | ICD-10-CM | POA: Diagnosis not present

## 2019-08-06 DIAGNOSIS — L89154 Pressure ulcer of sacral region, stage 4: Secondary | ICD-10-CM | POA: Diagnosis not present

## 2019-08-06 DIAGNOSIS — E1165 Type 2 diabetes mellitus with hyperglycemia: Secondary | ICD-10-CM | POA: Diagnosis not present

## 2019-08-06 DIAGNOSIS — Z86718 Personal history of other venous thrombosis and embolism: Secondary | ICD-10-CM | POA: Diagnosis not present

## 2019-08-06 DIAGNOSIS — R339 Retention of urine, unspecified: Secondary | ICD-10-CM | POA: Diagnosis not present

## 2019-08-06 DIAGNOSIS — Z8616 Personal history of COVID-19: Secondary | ICD-10-CM | POA: Diagnosis not present

## 2019-08-06 DIAGNOSIS — R000 Tachycardia, unspecified: Secondary | ICD-10-CM | POA: Diagnosis not present

## 2019-08-06 DIAGNOSIS — I69365 Other paralytic syndrome following cerebral infarction, bilateral: Secondary | ICD-10-CM | POA: Diagnosis not present

## 2019-08-06 DIAGNOSIS — Z87440 Personal history of urinary (tract) infections: Secondary | ICD-10-CM | POA: Diagnosis not present

## 2019-08-07 DIAGNOSIS — R000 Tachycardia, unspecified: Secondary | ICD-10-CM | POA: Diagnosis not present

## 2019-08-07 DIAGNOSIS — E1165 Type 2 diabetes mellitus with hyperglycemia: Secondary | ICD-10-CM | POA: Diagnosis not present

## 2019-08-07 DIAGNOSIS — I69320 Aphasia following cerebral infarction: Secondary | ICD-10-CM | POA: Diagnosis not present

## 2019-08-07 DIAGNOSIS — R339 Retention of urine, unspecified: Secondary | ICD-10-CM | POA: Diagnosis not present

## 2019-08-07 DIAGNOSIS — I69365 Other paralytic syndrome following cerebral infarction, bilateral: Secondary | ICD-10-CM | POA: Diagnosis not present

## 2019-08-07 DIAGNOSIS — Z87440 Personal history of urinary (tract) infections: Secondary | ICD-10-CM | POA: Diagnosis not present

## 2019-08-07 DIAGNOSIS — I10 Essential (primary) hypertension: Secondary | ICD-10-CM | POA: Diagnosis not present

## 2019-08-07 DIAGNOSIS — L89154 Pressure ulcer of sacral region, stage 4: Secondary | ICD-10-CM | POA: Diagnosis not present

## 2019-08-07 DIAGNOSIS — Z86718 Personal history of other venous thrombosis and embolism: Secondary | ICD-10-CM | POA: Diagnosis not present

## 2019-08-07 DIAGNOSIS — D649 Anemia, unspecified: Secondary | ICD-10-CM | POA: Diagnosis not present

## 2019-08-07 DIAGNOSIS — G40909 Epilepsy, unspecified, not intractable, without status epilepticus: Secondary | ICD-10-CM | POA: Diagnosis not present

## 2019-08-07 DIAGNOSIS — Z8616 Personal history of COVID-19: Secondary | ICD-10-CM | POA: Diagnosis not present

## 2019-08-07 DIAGNOSIS — G4733 Obstructive sleep apnea (adult) (pediatric): Secondary | ICD-10-CM | POA: Diagnosis not present

## 2019-08-07 DIAGNOSIS — Z7401 Bed confinement status: Secondary | ICD-10-CM | POA: Diagnosis not present

## 2019-08-07 DIAGNOSIS — G8250 Quadriplegia, unspecified: Secondary | ICD-10-CM | POA: Diagnosis not present

## 2019-08-08 DIAGNOSIS — R339 Retention of urine, unspecified: Secondary | ICD-10-CM | POA: Diagnosis not present

## 2019-08-08 DIAGNOSIS — G8250 Quadriplegia, unspecified: Secondary | ICD-10-CM | POA: Diagnosis not present

## 2019-08-08 DIAGNOSIS — Z86718 Personal history of other venous thrombosis and embolism: Secondary | ICD-10-CM | POA: Diagnosis not present

## 2019-08-08 DIAGNOSIS — Z87440 Personal history of urinary (tract) infections: Secondary | ICD-10-CM | POA: Diagnosis not present

## 2019-08-08 DIAGNOSIS — Z8616 Personal history of COVID-19: Secondary | ICD-10-CM | POA: Diagnosis not present

## 2019-08-08 DIAGNOSIS — Z7401 Bed confinement status: Secondary | ICD-10-CM | POA: Diagnosis not present

## 2019-08-08 DIAGNOSIS — G4733 Obstructive sleep apnea (adult) (pediatric): Secondary | ICD-10-CM | POA: Diagnosis not present

## 2019-08-08 DIAGNOSIS — L89154 Pressure ulcer of sacral region, stage 4: Secondary | ICD-10-CM | POA: Diagnosis not present

## 2019-08-08 DIAGNOSIS — I10 Essential (primary) hypertension: Secondary | ICD-10-CM | POA: Diagnosis not present

## 2019-08-08 DIAGNOSIS — E1165 Type 2 diabetes mellitus with hyperglycemia: Secondary | ICD-10-CM | POA: Diagnosis not present

## 2019-08-08 DIAGNOSIS — G40909 Epilepsy, unspecified, not intractable, without status epilepticus: Secondary | ICD-10-CM | POA: Diagnosis not present

## 2019-08-08 DIAGNOSIS — R000 Tachycardia, unspecified: Secondary | ICD-10-CM | POA: Diagnosis not present

## 2019-08-08 DIAGNOSIS — I69320 Aphasia following cerebral infarction: Secondary | ICD-10-CM | POA: Diagnosis not present

## 2019-08-08 DIAGNOSIS — I69365 Other paralytic syndrome following cerebral infarction, bilateral: Secondary | ICD-10-CM | POA: Diagnosis not present

## 2019-08-08 DIAGNOSIS — D649 Anemia, unspecified: Secondary | ICD-10-CM | POA: Diagnosis not present

## 2019-08-09 DIAGNOSIS — L89154 Pressure ulcer of sacral region, stage 4: Secondary | ICD-10-CM | POA: Diagnosis not present

## 2019-08-09 DIAGNOSIS — R000 Tachycardia, unspecified: Secondary | ICD-10-CM | POA: Diagnosis not present

## 2019-08-09 DIAGNOSIS — I69365 Other paralytic syndrome following cerebral infarction, bilateral: Secondary | ICD-10-CM | POA: Diagnosis not present

## 2019-08-09 DIAGNOSIS — Z7401 Bed confinement status: Secondary | ICD-10-CM | POA: Diagnosis not present

## 2019-08-09 DIAGNOSIS — G8250 Quadriplegia, unspecified: Secondary | ICD-10-CM | POA: Diagnosis not present

## 2019-08-09 DIAGNOSIS — R339 Retention of urine, unspecified: Secondary | ICD-10-CM | POA: Diagnosis not present

## 2019-08-09 DIAGNOSIS — Z86718 Personal history of other venous thrombosis and embolism: Secondary | ICD-10-CM | POA: Diagnosis not present

## 2019-08-09 DIAGNOSIS — I6389 Other cerebral infarction: Secondary | ICD-10-CM | POA: Diagnosis not present

## 2019-08-09 DIAGNOSIS — Z87440 Personal history of urinary (tract) infections: Secondary | ICD-10-CM | POA: Diagnosis not present

## 2019-08-09 DIAGNOSIS — Z8616 Personal history of COVID-19: Secondary | ICD-10-CM | POA: Diagnosis not present

## 2019-08-09 DIAGNOSIS — L8994 Pressure ulcer of unspecified site, stage 4: Secondary | ICD-10-CM | POA: Diagnosis not present

## 2019-08-09 DIAGNOSIS — E1165 Type 2 diabetes mellitus with hyperglycemia: Secondary | ICD-10-CM | POA: Diagnosis not present

## 2019-08-09 DIAGNOSIS — I69320 Aphasia following cerebral infarction: Secondary | ICD-10-CM | POA: Diagnosis not present

## 2019-08-09 DIAGNOSIS — G4733 Obstructive sleep apnea (adult) (pediatric): Secondary | ICD-10-CM | POA: Diagnosis not present

## 2019-08-09 DIAGNOSIS — I10 Essential (primary) hypertension: Secondary | ICD-10-CM | POA: Diagnosis not present

## 2019-08-09 DIAGNOSIS — G40909 Epilepsy, unspecified, not intractable, without status epilepticus: Secondary | ICD-10-CM | POA: Diagnosis not present

## 2019-08-09 DIAGNOSIS — D649 Anemia, unspecified: Secondary | ICD-10-CM | POA: Diagnosis not present

## 2019-08-11 DIAGNOSIS — Z930 Tracheostomy status: Secondary | ICD-10-CM | POA: Diagnosis not present

## 2019-08-12 DIAGNOSIS — I69365 Other paralytic syndrome following cerebral infarction, bilateral: Secondary | ICD-10-CM | POA: Diagnosis not present

## 2019-08-12 DIAGNOSIS — Z8616 Personal history of COVID-19: Secondary | ICD-10-CM | POA: Diagnosis not present

## 2019-08-12 DIAGNOSIS — D649 Anemia, unspecified: Secondary | ICD-10-CM | POA: Diagnosis not present

## 2019-08-12 DIAGNOSIS — I69320 Aphasia following cerebral infarction: Secondary | ICD-10-CM | POA: Diagnosis not present

## 2019-08-12 DIAGNOSIS — E1165 Type 2 diabetes mellitus with hyperglycemia: Secondary | ICD-10-CM | POA: Diagnosis not present

## 2019-08-12 DIAGNOSIS — Z87440 Personal history of urinary (tract) infections: Secondary | ICD-10-CM | POA: Diagnosis not present

## 2019-08-12 DIAGNOSIS — I10 Essential (primary) hypertension: Secondary | ICD-10-CM | POA: Diagnosis not present

## 2019-08-12 DIAGNOSIS — R339 Retention of urine, unspecified: Secondary | ICD-10-CM | POA: Diagnosis not present

## 2019-08-12 DIAGNOSIS — Z86718 Personal history of other venous thrombosis and embolism: Secondary | ICD-10-CM | POA: Diagnosis not present

## 2019-08-12 DIAGNOSIS — G4733 Obstructive sleep apnea (adult) (pediatric): Secondary | ICD-10-CM | POA: Diagnosis not present

## 2019-08-12 DIAGNOSIS — R000 Tachycardia, unspecified: Secondary | ICD-10-CM | POA: Diagnosis not present

## 2019-08-12 DIAGNOSIS — Z7401 Bed confinement status: Secondary | ICD-10-CM | POA: Diagnosis not present

## 2019-08-12 DIAGNOSIS — L89154 Pressure ulcer of sacral region, stage 4: Secondary | ICD-10-CM | POA: Diagnosis not present

## 2019-08-12 DIAGNOSIS — G40909 Epilepsy, unspecified, not intractable, without status epilepticus: Secondary | ICD-10-CM | POA: Diagnosis not present

## 2019-08-12 DIAGNOSIS — G8250 Quadriplegia, unspecified: Secondary | ICD-10-CM | POA: Diagnosis not present

## 2019-08-13 DIAGNOSIS — I69320 Aphasia following cerebral infarction: Secondary | ICD-10-CM | POA: Diagnosis not present

## 2019-08-13 DIAGNOSIS — G8250 Quadriplegia, unspecified: Secondary | ICD-10-CM | POA: Diagnosis not present

## 2019-08-13 DIAGNOSIS — Z87440 Personal history of urinary (tract) infections: Secondary | ICD-10-CM | POA: Diagnosis not present

## 2019-08-13 DIAGNOSIS — I69365 Other paralytic syndrome following cerebral infarction, bilateral: Secondary | ICD-10-CM | POA: Diagnosis not present

## 2019-08-13 DIAGNOSIS — Z8616 Personal history of COVID-19: Secondary | ICD-10-CM | POA: Diagnosis not present

## 2019-08-13 DIAGNOSIS — R000 Tachycardia, unspecified: Secondary | ICD-10-CM | POA: Diagnosis not present

## 2019-08-13 DIAGNOSIS — G4733 Obstructive sleep apnea (adult) (pediatric): Secondary | ICD-10-CM | POA: Diagnosis not present

## 2019-08-13 DIAGNOSIS — R339 Retention of urine, unspecified: Secondary | ICD-10-CM | POA: Diagnosis not present

## 2019-08-13 DIAGNOSIS — Z86718 Personal history of other venous thrombosis and embolism: Secondary | ICD-10-CM | POA: Diagnosis not present

## 2019-08-13 DIAGNOSIS — L89154 Pressure ulcer of sacral region, stage 4: Secondary | ICD-10-CM | POA: Diagnosis not present

## 2019-08-13 DIAGNOSIS — Z7401 Bed confinement status: Secondary | ICD-10-CM | POA: Diagnosis not present

## 2019-08-13 DIAGNOSIS — E1165 Type 2 diabetes mellitus with hyperglycemia: Secondary | ICD-10-CM | POA: Diagnosis not present

## 2019-08-13 DIAGNOSIS — I10 Essential (primary) hypertension: Secondary | ICD-10-CM | POA: Diagnosis not present

## 2019-08-13 DIAGNOSIS — D649 Anemia, unspecified: Secondary | ICD-10-CM | POA: Diagnosis not present

## 2019-08-13 DIAGNOSIS — G40909 Epilepsy, unspecified, not intractable, without status epilepticus: Secondary | ICD-10-CM | POA: Diagnosis not present

## 2019-08-14 DIAGNOSIS — I10 Essential (primary) hypertension: Secondary | ICD-10-CM | POA: Diagnosis not present

## 2019-08-14 DIAGNOSIS — Z86718 Personal history of other venous thrombosis and embolism: Secondary | ICD-10-CM | POA: Diagnosis not present

## 2019-08-14 DIAGNOSIS — G8250 Quadriplegia, unspecified: Secondary | ICD-10-CM | POA: Diagnosis not present

## 2019-08-14 DIAGNOSIS — G40909 Epilepsy, unspecified, not intractable, without status epilepticus: Secondary | ICD-10-CM | POA: Diagnosis not present

## 2019-08-14 DIAGNOSIS — Z87440 Personal history of urinary (tract) infections: Secondary | ICD-10-CM | POA: Diagnosis not present

## 2019-08-14 DIAGNOSIS — I69320 Aphasia following cerebral infarction: Secondary | ICD-10-CM | POA: Diagnosis not present

## 2019-08-14 DIAGNOSIS — Z8616 Personal history of COVID-19: Secondary | ICD-10-CM | POA: Diagnosis not present

## 2019-08-14 DIAGNOSIS — I69365 Other paralytic syndrome following cerebral infarction, bilateral: Secondary | ICD-10-CM | POA: Diagnosis not present

## 2019-08-14 DIAGNOSIS — R339 Retention of urine, unspecified: Secondary | ICD-10-CM | POA: Diagnosis not present

## 2019-08-14 DIAGNOSIS — L89154 Pressure ulcer of sacral region, stage 4: Secondary | ICD-10-CM | POA: Diagnosis not present

## 2019-08-14 DIAGNOSIS — Z7401 Bed confinement status: Secondary | ICD-10-CM | POA: Diagnosis not present

## 2019-08-14 DIAGNOSIS — E1165 Type 2 diabetes mellitus with hyperglycemia: Secondary | ICD-10-CM | POA: Diagnosis not present

## 2019-08-14 DIAGNOSIS — D649 Anemia, unspecified: Secondary | ICD-10-CM | POA: Diagnosis not present

## 2019-08-14 DIAGNOSIS — R000 Tachycardia, unspecified: Secondary | ICD-10-CM | POA: Diagnosis not present

## 2019-08-14 DIAGNOSIS — G4733 Obstructive sleep apnea (adult) (pediatric): Secondary | ICD-10-CM | POA: Diagnosis not present

## 2019-08-15 DIAGNOSIS — Z8616 Personal history of COVID-19: Secondary | ICD-10-CM | POA: Diagnosis not present

## 2019-08-15 DIAGNOSIS — I69365 Other paralytic syndrome following cerebral infarction, bilateral: Secondary | ICD-10-CM | POA: Diagnosis not present

## 2019-08-15 DIAGNOSIS — G4733 Obstructive sleep apnea (adult) (pediatric): Secondary | ICD-10-CM | POA: Diagnosis not present

## 2019-08-15 DIAGNOSIS — L89154 Pressure ulcer of sacral region, stage 4: Secondary | ICD-10-CM | POA: Diagnosis not present

## 2019-08-15 DIAGNOSIS — G40909 Epilepsy, unspecified, not intractable, without status epilepticus: Secondary | ICD-10-CM | POA: Diagnosis not present

## 2019-08-15 DIAGNOSIS — G8250 Quadriplegia, unspecified: Secondary | ICD-10-CM | POA: Diagnosis not present

## 2019-08-15 DIAGNOSIS — I10 Essential (primary) hypertension: Secondary | ICD-10-CM | POA: Diagnosis not present

## 2019-08-15 DIAGNOSIS — Z930 Tracheostomy status: Secondary | ICD-10-CM | POA: Diagnosis not present

## 2019-08-15 DIAGNOSIS — Z7401 Bed confinement status: Secondary | ICD-10-CM | POA: Diagnosis not present

## 2019-08-15 DIAGNOSIS — I69320 Aphasia following cerebral infarction: Secondary | ICD-10-CM | POA: Diagnosis not present

## 2019-08-15 DIAGNOSIS — R000 Tachycardia, unspecified: Secondary | ICD-10-CM | POA: Diagnosis not present

## 2019-08-15 DIAGNOSIS — Z86718 Personal history of other venous thrombosis and embolism: Secondary | ICD-10-CM | POA: Diagnosis not present

## 2019-08-15 DIAGNOSIS — E1165 Type 2 diabetes mellitus with hyperglycemia: Secondary | ICD-10-CM | POA: Diagnosis not present

## 2019-08-15 DIAGNOSIS — R339 Retention of urine, unspecified: Secondary | ICD-10-CM | POA: Diagnosis not present

## 2019-08-15 DIAGNOSIS — Z87440 Personal history of urinary (tract) infections: Secondary | ICD-10-CM | POA: Diagnosis not present

## 2019-08-15 DIAGNOSIS — D649 Anemia, unspecified: Secondary | ICD-10-CM | POA: Diagnosis not present

## 2019-08-16 DIAGNOSIS — G8250 Quadriplegia, unspecified: Secondary | ICD-10-CM | POA: Diagnosis not present

## 2019-08-16 DIAGNOSIS — I10 Essential (primary) hypertension: Secondary | ICD-10-CM | POA: Diagnosis not present

## 2019-08-16 DIAGNOSIS — Z86718 Personal history of other venous thrombosis and embolism: Secondary | ICD-10-CM | POA: Diagnosis not present

## 2019-08-16 DIAGNOSIS — Z7401 Bed confinement status: Secondary | ICD-10-CM | POA: Diagnosis not present

## 2019-08-16 DIAGNOSIS — R339 Retention of urine, unspecified: Secondary | ICD-10-CM | POA: Diagnosis not present

## 2019-08-16 DIAGNOSIS — D649 Anemia, unspecified: Secondary | ICD-10-CM | POA: Diagnosis not present

## 2019-08-16 DIAGNOSIS — Z87440 Personal history of urinary (tract) infections: Secondary | ICD-10-CM | POA: Diagnosis not present

## 2019-08-16 DIAGNOSIS — I69320 Aphasia following cerebral infarction: Secondary | ICD-10-CM | POA: Diagnosis not present

## 2019-08-16 DIAGNOSIS — R000 Tachycardia, unspecified: Secondary | ICD-10-CM | POA: Diagnosis not present

## 2019-08-16 DIAGNOSIS — I69365 Other paralytic syndrome following cerebral infarction, bilateral: Secondary | ICD-10-CM | POA: Diagnosis not present

## 2019-08-16 DIAGNOSIS — L89154 Pressure ulcer of sacral region, stage 4: Secondary | ICD-10-CM | POA: Diagnosis not present

## 2019-08-16 DIAGNOSIS — G4733 Obstructive sleep apnea (adult) (pediatric): Secondary | ICD-10-CM | POA: Diagnosis not present

## 2019-08-16 DIAGNOSIS — G40909 Epilepsy, unspecified, not intractable, without status epilepticus: Secondary | ICD-10-CM | POA: Diagnosis not present

## 2019-08-16 DIAGNOSIS — Z8616 Personal history of COVID-19: Secondary | ICD-10-CM | POA: Diagnosis not present

## 2019-08-16 DIAGNOSIS — E1165 Type 2 diabetes mellitus with hyperglycemia: Secondary | ICD-10-CM | POA: Diagnosis not present

## 2019-08-19 DIAGNOSIS — R32 Unspecified urinary incontinence: Secondary | ICD-10-CM | POA: Diagnosis not present

## 2019-08-19 DIAGNOSIS — Z86718 Personal history of other venous thrombosis and embolism: Secondary | ICD-10-CM | POA: Diagnosis not present

## 2019-08-19 DIAGNOSIS — I10 Essential (primary) hypertension: Secondary | ICD-10-CM | POA: Diagnosis not present

## 2019-08-19 DIAGNOSIS — L8994 Pressure ulcer of unspecified site, stage 4: Secondary | ICD-10-CM | POA: Diagnosis not present

## 2019-08-19 DIAGNOSIS — I69320 Aphasia following cerebral infarction: Secondary | ICD-10-CM | POA: Diagnosis not present

## 2019-08-19 DIAGNOSIS — D649 Anemia, unspecified: Secondary | ICD-10-CM | POA: Diagnosis not present

## 2019-08-19 DIAGNOSIS — G4733 Obstructive sleep apnea (adult) (pediatric): Secondary | ICD-10-CM | POA: Diagnosis not present

## 2019-08-19 DIAGNOSIS — I69365 Other paralytic syndrome following cerebral infarction, bilateral: Secondary | ICD-10-CM | POA: Diagnosis not present

## 2019-08-19 DIAGNOSIS — R339 Retention of urine, unspecified: Secondary | ICD-10-CM | POA: Diagnosis not present

## 2019-08-19 DIAGNOSIS — Z8616 Personal history of COVID-19: Secondary | ICD-10-CM | POA: Diagnosis not present

## 2019-08-19 DIAGNOSIS — I6389 Other cerebral infarction: Secondary | ICD-10-CM | POA: Diagnosis not present

## 2019-08-19 DIAGNOSIS — E1165 Type 2 diabetes mellitus with hyperglycemia: Secondary | ICD-10-CM | POA: Diagnosis not present

## 2019-08-19 DIAGNOSIS — R000 Tachycardia, unspecified: Secondary | ICD-10-CM | POA: Diagnosis not present

## 2019-08-19 DIAGNOSIS — G8250 Quadriplegia, unspecified: Secondary | ICD-10-CM | POA: Diagnosis not present

## 2019-08-19 DIAGNOSIS — L89154 Pressure ulcer of sacral region, stage 4: Secondary | ICD-10-CM | POA: Diagnosis not present

## 2019-08-19 DIAGNOSIS — G40909 Epilepsy, unspecified, not intractable, without status epilepticus: Secondary | ICD-10-CM | POA: Diagnosis not present

## 2019-08-19 DIAGNOSIS — Z87440 Personal history of urinary (tract) infections: Secondary | ICD-10-CM | POA: Diagnosis not present

## 2019-08-19 DIAGNOSIS — Z7401 Bed confinement status: Secondary | ICD-10-CM | POA: Diagnosis not present

## 2019-08-20 DIAGNOSIS — R000 Tachycardia, unspecified: Secondary | ICD-10-CM | POA: Diagnosis not present

## 2019-08-20 DIAGNOSIS — G8250 Quadriplegia, unspecified: Secondary | ICD-10-CM | POA: Diagnosis not present

## 2019-08-20 DIAGNOSIS — E1165 Type 2 diabetes mellitus with hyperglycemia: Secondary | ICD-10-CM | POA: Diagnosis not present

## 2019-08-20 DIAGNOSIS — Z86718 Personal history of other venous thrombosis and embolism: Secondary | ICD-10-CM | POA: Diagnosis not present

## 2019-08-20 DIAGNOSIS — I69320 Aphasia following cerebral infarction: Secondary | ICD-10-CM | POA: Diagnosis not present

## 2019-08-20 DIAGNOSIS — I10 Essential (primary) hypertension: Secondary | ICD-10-CM | POA: Diagnosis not present

## 2019-08-20 DIAGNOSIS — G40909 Epilepsy, unspecified, not intractable, without status epilepticus: Secondary | ICD-10-CM | POA: Diagnosis not present

## 2019-08-20 DIAGNOSIS — R339 Retention of urine, unspecified: Secondary | ICD-10-CM | POA: Diagnosis not present

## 2019-08-20 DIAGNOSIS — G4733 Obstructive sleep apnea (adult) (pediatric): Secondary | ICD-10-CM | POA: Diagnosis not present

## 2019-08-20 DIAGNOSIS — L89154 Pressure ulcer of sacral region, stage 4: Secondary | ICD-10-CM | POA: Diagnosis not present

## 2019-08-20 DIAGNOSIS — Z7401 Bed confinement status: Secondary | ICD-10-CM | POA: Diagnosis not present

## 2019-08-20 DIAGNOSIS — D649 Anemia, unspecified: Secondary | ICD-10-CM | POA: Diagnosis not present

## 2019-08-20 DIAGNOSIS — Z8616 Personal history of COVID-19: Secondary | ICD-10-CM | POA: Diagnosis not present

## 2019-08-20 DIAGNOSIS — Z87440 Personal history of urinary (tract) infections: Secondary | ICD-10-CM | POA: Diagnosis not present

## 2019-08-20 DIAGNOSIS — I69365 Other paralytic syndrome following cerebral infarction, bilateral: Secondary | ICD-10-CM | POA: Diagnosis not present

## 2019-08-21 DIAGNOSIS — Z87440 Personal history of urinary (tract) infections: Secondary | ICD-10-CM | POA: Diagnosis not present

## 2019-08-21 DIAGNOSIS — G40909 Epilepsy, unspecified, not intractable, without status epilepticus: Secondary | ICD-10-CM | POA: Diagnosis not present

## 2019-08-21 DIAGNOSIS — I69365 Other paralytic syndrome following cerebral infarction, bilateral: Secondary | ICD-10-CM | POA: Diagnosis not present

## 2019-08-21 DIAGNOSIS — Z7401 Bed confinement status: Secondary | ICD-10-CM | POA: Diagnosis not present

## 2019-08-21 DIAGNOSIS — R339 Retention of urine, unspecified: Secondary | ICD-10-CM | POA: Diagnosis not present

## 2019-08-21 DIAGNOSIS — G4733 Obstructive sleep apnea (adult) (pediatric): Secondary | ICD-10-CM | POA: Diagnosis not present

## 2019-08-21 DIAGNOSIS — I69320 Aphasia following cerebral infarction: Secondary | ICD-10-CM | POA: Diagnosis not present

## 2019-08-21 DIAGNOSIS — D649 Anemia, unspecified: Secondary | ICD-10-CM | POA: Diagnosis not present

## 2019-08-21 DIAGNOSIS — I10 Essential (primary) hypertension: Secondary | ICD-10-CM | POA: Diagnosis not present

## 2019-08-21 DIAGNOSIS — Z86718 Personal history of other venous thrombosis and embolism: Secondary | ICD-10-CM | POA: Diagnosis not present

## 2019-08-21 DIAGNOSIS — Z8616 Personal history of COVID-19: Secondary | ICD-10-CM | POA: Diagnosis not present

## 2019-08-21 DIAGNOSIS — E1165 Type 2 diabetes mellitus with hyperglycemia: Secondary | ICD-10-CM | POA: Diagnosis not present

## 2019-08-21 DIAGNOSIS — L89154 Pressure ulcer of sacral region, stage 4: Secondary | ICD-10-CM | POA: Diagnosis not present

## 2019-08-21 DIAGNOSIS — G8250 Quadriplegia, unspecified: Secondary | ICD-10-CM | POA: Diagnosis not present

## 2019-08-21 DIAGNOSIS — R000 Tachycardia, unspecified: Secondary | ICD-10-CM | POA: Diagnosis not present

## 2019-08-22 DIAGNOSIS — R000 Tachycardia, unspecified: Secondary | ICD-10-CM | POA: Diagnosis not present

## 2019-08-22 DIAGNOSIS — I10 Essential (primary) hypertension: Secondary | ICD-10-CM | POA: Diagnosis not present

## 2019-08-22 DIAGNOSIS — G4733 Obstructive sleep apnea (adult) (pediatric): Secondary | ICD-10-CM | POA: Diagnosis not present

## 2019-08-22 DIAGNOSIS — Z8616 Personal history of COVID-19: Secondary | ICD-10-CM | POA: Diagnosis not present

## 2019-08-22 DIAGNOSIS — R339 Retention of urine, unspecified: Secondary | ICD-10-CM | POA: Diagnosis not present

## 2019-08-22 DIAGNOSIS — Z7401 Bed confinement status: Secondary | ICD-10-CM | POA: Diagnosis not present

## 2019-08-22 DIAGNOSIS — Z87440 Personal history of urinary (tract) infections: Secondary | ICD-10-CM | POA: Diagnosis not present

## 2019-08-22 DIAGNOSIS — D649 Anemia, unspecified: Secondary | ICD-10-CM | POA: Diagnosis not present

## 2019-08-22 DIAGNOSIS — G8250 Quadriplegia, unspecified: Secondary | ICD-10-CM | POA: Diagnosis not present

## 2019-08-22 DIAGNOSIS — I69320 Aphasia following cerebral infarction: Secondary | ICD-10-CM | POA: Diagnosis not present

## 2019-08-22 DIAGNOSIS — Z86718 Personal history of other venous thrombosis and embolism: Secondary | ICD-10-CM | POA: Diagnosis not present

## 2019-08-22 DIAGNOSIS — G40909 Epilepsy, unspecified, not intractable, without status epilepticus: Secondary | ICD-10-CM | POA: Diagnosis not present

## 2019-08-22 DIAGNOSIS — L89154 Pressure ulcer of sacral region, stage 4: Secondary | ICD-10-CM | POA: Diagnosis not present

## 2019-08-22 DIAGNOSIS — E1165 Type 2 diabetes mellitus with hyperglycemia: Secondary | ICD-10-CM | POA: Diagnosis not present

## 2019-08-22 DIAGNOSIS — I69365 Other paralytic syndrome following cerebral infarction, bilateral: Secondary | ICD-10-CM | POA: Diagnosis not present

## 2019-08-23 DIAGNOSIS — D649 Anemia, unspecified: Secondary | ICD-10-CM | POA: Diagnosis not present

## 2019-08-23 DIAGNOSIS — Z86718 Personal history of other venous thrombosis and embolism: Secondary | ICD-10-CM | POA: Diagnosis not present

## 2019-08-23 DIAGNOSIS — R000 Tachycardia, unspecified: Secondary | ICD-10-CM | POA: Diagnosis not present

## 2019-08-23 DIAGNOSIS — G4733 Obstructive sleep apnea (adult) (pediatric): Secondary | ICD-10-CM | POA: Diagnosis not present

## 2019-08-23 DIAGNOSIS — G8250 Quadriplegia, unspecified: Secondary | ICD-10-CM | POA: Diagnosis not present

## 2019-08-23 DIAGNOSIS — I10 Essential (primary) hypertension: Secondary | ICD-10-CM | POA: Diagnosis not present

## 2019-08-23 DIAGNOSIS — Z8616 Personal history of COVID-19: Secondary | ICD-10-CM | POA: Diagnosis not present

## 2019-08-23 DIAGNOSIS — Z87440 Personal history of urinary (tract) infections: Secondary | ICD-10-CM | POA: Diagnosis not present

## 2019-08-23 DIAGNOSIS — G40909 Epilepsy, unspecified, not intractable, without status epilepticus: Secondary | ICD-10-CM | POA: Diagnosis not present

## 2019-08-23 DIAGNOSIS — E1165 Type 2 diabetes mellitus with hyperglycemia: Secondary | ICD-10-CM | POA: Diagnosis not present

## 2019-08-23 DIAGNOSIS — I69320 Aphasia following cerebral infarction: Secondary | ICD-10-CM | POA: Diagnosis not present

## 2019-08-23 DIAGNOSIS — R339 Retention of urine, unspecified: Secondary | ICD-10-CM | POA: Diagnosis not present

## 2019-08-23 DIAGNOSIS — I69365 Other paralytic syndrome following cerebral infarction, bilateral: Secondary | ICD-10-CM | POA: Diagnosis not present

## 2019-08-23 DIAGNOSIS — L89154 Pressure ulcer of sacral region, stage 4: Secondary | ICD-10-CM | POA: Diagnosis not present

## 2019-08-23 DIAGNOSIS — Z7401 Bed confinement status: Secondary | ICD-10-CM | POA: Diagnosis not present

## 2019-08-26 DIAGNOSIS — Z8616 Personal history of COVID-19: Secondary | ICD-10-CM | POA: Diagnosis not present

## 2019-08-26 DIAGNOSIS — I10 Essential (primary) hypertension: Secondary | ICD-10-CM | POA: Diagnosis not present

## 2019-08-26 DIAGNOSIS — Z86718 Personal history of other venous thrombosis and embolism: Secondary | ICD-10-CM | POA: Diagnosis not present

## 2019-08-26 DIAGNOSIS — Z7401 Bed confinement status: Secondary | ICD-10-CM | POA: Diagnosis not present

## 2019-08-26 DIAGNOSIS — R339 Retention of urine, unspecified: Secondary | ICD-10-CM | POA: Diagnosis not present

## 2019-08-26 DIAGNOSIS — I69365 Other paralytic syndrome following cerebral infarction, bilateral: Secondary | ICD-10-CM | POA: Diagnosis not present

## 2019-08-26 DIAGNOSIS — E1165 Type 2 diabetes mellitus with hyperglycemia: Secondary | ICD-10-CM | POA: Diagnosis not present

## 2019-08-26 DIAGNOSIS — D649 Anemia, unspecified: Secondary | ICD-10-CM | POA: Diagnosis not present

## 2019-08-26 DIAGNOSIS — I6389 Other cerebral infarction: Secondary | ICD-10-CM | POA: Diagnosis not present

## 2019-08-26 DIAGNOSIS — L89154 Pressure ulcer of sacral region, stage 4: Secondary | ICD-10-CM | POA: Diagnosis not present

## 2019-08-26 DIAGNOSIS — G40909 Epilepsy, unspecified, not intractable, without status epilepticus: Secondary | ICD-10-CM | POA: Diagnosis not present

## 2019-08-26 DIAGNOSIS — G4733 Obstructive sleep apnea (adult) (pediatric): Secondary | ICD-10-CM | POA: Diagnosis not present

## 2019-08-26 DIAGNOSIS — G8250 Quadriplegia, unspecified: Secondary | ICD-10-CM | POA: Diagnosis not present

## 2019-08-26 DIAGNOSIS — I69320 Aphasia following cerebral infarction: Secondary | ICD-10-CM | POA: Diagnosis not present

## 2019-08-26 DIAGNOSIS — L8994 Pressure ulcer of unspecified site, stage 4: Secondary | ICD-10-CM | POA: Diagnosis not present

## 2019-08-26 DIAGNOSIS — R000 Tachycardia, unspecified: Secondary | ICD-10-CM | POA: Diagnosis not present

## 2019-08-26 DIAGNOSIS — Z87440 Personal history of urinary (tract) infections: Secondary | ICD-10-CM | POA: Diagnosis not present

## 2019-08-27 DIAGNOSIS — I10 Essential (primary) hypertension: Secondary | ICD-10-CM | POA: Diagnosis not present

## 2019-08-27 DIAGNOSIS — Z7401 Bed confinement status: Secondary | ICD-10-CM | POA: Diagnosis not present

## 2019-08-27 DIAGNOSIS — Z8616 Personal history of COVID-19: Secondary | ICD-10-CM | POA: Diagnosis not present

## 2019-08-27 DIAGNOSIS — R000 Tachycardia, unspecified: Secondary | ICD-10-CM | POA: Diagnosis not present

## 2019-08-27 DIAGNOSIS — R339 Retention of urine, unspecified: Secondary | ICD-10-CM | POA: Diagnosis not present

## 2019-08-27 DIAGNOSIS — G4733 Obstructive sleep apnea (adult) (pediatric): Secondary | ICD-10-CM | POA: Diagnosis not present

## 2019-08-27 DIAGNOSIS — E1165 Type 2 diabetes mellitus with hyperglycemia: Secondary | ICD-10-CM | POA: Diagnosis not present

## 2019-08-27 DIAGNOSIS — G40909 Epilepsy, unspecified, not intractable, without status epilepticus: Secondary | ICD-10-CM | POA: Diagnosis not present

## 2019-08-27 DIAGNOSIS — G8250 Quadriplegia, unspecified: Secondary | ICD-10-CM | POA: Diagnosis not present

## 2019-08-27 DIAGNOSIS — D649 Anemia, unspecified: Secondary | ICD-10-CM | POA: Diagnosis not present

## 2019-08-27 DIAGNOSIS — I69365 Other paralytic syndrome following cerebral infarction, bilateral: Secondary | ICD-10-CM | POA: Diagnosis not present

## 2019-08-27 DIAGNOSIS — L89154 Pressure ulcer of sacral region, stage 4: Secondary | ICD-10-CM | POA: Diagnosis not present

## 2019-08-27 DIAGNOSIS — Z86718 Personal history of other venous thrombosis and embolism: Secondary | ICD-10-CM | POA: Diagnosis not present

## 2019-08-27 DIAGNOSIS — I69320 Aphasia following cerebral infarction: Secondary | ICD-10-CM | POA: Diagnosis not present

## 2019-08-27 DIAGNOSIS — Z87440 Personal history of urinary (tract) infections: Secondary | ICD-10-CM | POA: Diagnosis not present

## 2019-08-28 DIAGNOSIS — Z86718 Personal history of other venous thrombosis and embolism: Secondary | ICD-10-CM | POA: Diagnosis not present

## 2019-08-28 DIAGNOSIS — I69365 Other paralytic syndrome following cerebral infarction, bilateral: Secondary | ICD-10-CM | POA: Diagnosis not present

## 2019-08-28 DIAGNOSIS — G8250 Quadriplegia, unspecified: Secondary | ICD-10-CM | POA: Diagnosis not present

## 2019-08-28 DIAGNOSIS — G4733 Obstructive sleep apnea (adult) (pediatric): Secondary | ICD-10-CM | POA: Diagnosis not present

## 2019-08-28 DIAGNOSIS — R339 Retention of urine, unspecified: Secondary | ICD-10-CM | POA: Diagnosis not present

## 2019-08-28 DIAGNOSIS — Z7401 Bed confinement status: Secondary | ICD-10-CM | POA: Diagnosis not present

## 2019-08-28 DIAGNOSIS — Z87440 Personal history of urinary (tract) infections: Secondary | ICD-10-CM | POA: Diagnosis not present

## 2019-08-28 DIAGNOSIS — D649 Anemia, unspecified: Secondary | ICD-10-CM | POA: Diagnosis not present

## 2019-08-28 DIAGNOSIS — G40909 Epilepsy, unspecified, not intractable, without status epilepticus: Secondary | ICD-10-CM | POA: Diagnosis not present

## 2019-08-28 DIAGNOSIS — R000 Tachycardia, unspecified: Secondary | ICD-10-CM | POA: Diagnosis not present

## 2019-08-28 DIAGNOSIS — L89154 Pressure ulcer of sacral region, stage 4: Secondary | ICD-10-CM | POA: Diagnosis not present

## 2019-08-28 DIAGNOSIS — E1165 Type 2 diabetes mellitus with hyperglycemia: Secondary | ICD-10-CM | POA: Diagnosis not present

## 2019-08-28 DIAGNOSIS — Z8616 Personal history of COVID-19: Secondary | ICD-10-CM | POA: Diagnosis not present

## 2019-08-28 DIAGNOSIS — I69320 Aphasia following cerebral infarction: Secondary | ICD-10-CM | POA: Diagnosis not present

## 2019-08-28 DIAGNOSIS — I10 Essential (primary) hypertension: Secondary | ICD-10-CM | POA: Diagnosis not present

## 2019-08-29 DIAGNOSIS — G40909 Epilepsy, unspecified, not intractable, without status epilepticus: Secondary | ICD-10-CM | POA: Diagnosis not present

## 2019-08-29 DIAGNOSIS — G4733 Obstructive sleep apnea (adult) (pediatric): Secondary | ICD-10-CM | POA: Diagnosis not present

## 2019-08-29 DIAGNOSIS — R339 Retention of urine, unspecified: Secondary | ICD-10-CM | POA: Diagnosis not present

## 2019-08-29 DIAGNOSIS — I69365 Other paralytic syndrome following cerebral infarction, bilateral: Secondary | ICD-10-CM | POA: Diagnosis not present

## 2019-08-29 DIAGNOSIS — G8250 Quadriplegia, unspecified: Secondary | ICD-10-CM | POA: Diagnosis not present

## 2019-08-29 DIAGNOSIS — Z8616 Personal history of COVID-19: Secondary | ICD-10-CM | POA: Diagnosis not present

## 2019-08-29 DIAGNOSIS — I69320 Aphasia following cerebral infarction: Secondary | ICD-10-CM | POA: Diagnosis not present

## 2019-08-29 DIAGNOSIS — L89154 Pressure ulcer of sacral region, stage 4: Secondary | ICD-10-CM | POA: Diagnosis not present

## 2019-08-29 DIAGNOSIS — Z86718 Personal history of other venous thrombosis and embolism: Secondary | ICD-10-CM | POA: Diagnosis not present

## 2019-08-29 DIAGNOSIS — D649 Anemia, unspecified: Secondary | ICD-10-CM | POA: Diagnosis not present

## 2019-08-29 DIAGNOSIS — E1165 Type 2 diabetes mellitus with hyperglycemia: Secondary | ICD-10-CM | POA: Diagnosis not present

## 2019-08-29 DIAGNOSIS — T8189XA Other complications of procedures, not elsewhere classified, initial encounter: Secondary | ICD-10-CM | POA: Diagnosis not present

## 2019-08-29 DIAGNOSIS — R000 Tachycardia, unspecified: Secondary | ICD-10-CM | POA: Diagnosis not present

## 2019-08-29 DIAGNOSIS — Z87440 Personal history of urinary (tract) infections: Secondary | ICD-10-CM | POA: Diagnosis not present

## 2019-08-29 DIAGNOSIS — I10 Essential (primary) hypertension: Secondary | ICD-10-CM | POA: Diagnosis not present

## 2019-08-29 DIAGNOSIS — Z7401 Bed confinement status: Secondary | ICD-10-CM | POA: Diagnosis not present

## 2019-08-30 DIAGNOSIS — G4733 Obstructive sleep apnea (adult) (pediatric): Secondary | ICD-10-CM | POA: Diagnosis not present

## 2019-08-30 DIAGNOSIS — Z7401 Bed confinement status: Secondary | ICD-10-CM | POA: Diagnosis not present

## 2019-08-30 DIAGNOSIS — E1165 Type 2 diabetes mellitus with hyperglycemia: Secondary | ICD-10-CM | POA: Diagnosis not present

## 2019-08-30 DIAGNOSIS — I69320 Aphasia following cerebral infarction: Secondary | ICD-10-CM | POA: Diagnosis not present

## 2019-08-30 DIAGNOSIS — I10 Essential (primary) hypertension: Secondary | ICD-10-CM | POA: Diagnosis not present

## 2019-08-30 DIAGNOSIS — Z87440 Personal history of urinary (tract) infections: Secondary | ICD-10-CM | POA: Diagnosis not present

## 2019-08-30 DIAGNOSIS — G40909 Epilepsy, unspecified, not intractable, without status epilepticus: Secondary | ICD-10-CM | POA: Diagnosis not present

## 2019-08-30 DIAGNOSIS — R000 Tachycardia, unspecified: Secondary | ICD-10-CM | POA: Diagnosis not present

## 2019-08-30 DIAGNOSIS — I69365 Other paralytic syndrome following cerebral infarction, bilateral: Secondary | ICD-10-CM | POA: Diagnosis not present

## 2019-08-30 DIAGNOSIS — D649 Anemia, unspecified: Secondary | ICD-10-CM | POA: Diagnosis not present

## 2019-08-30 DIAGNOSIS — Z86718 Personal history of other venous thrombosis and embolism: Secondary | ICD-10-CM | POA: Diagnosis not present

## 2019-08-30 DIAGNOSIS — L89154 Pressure ulcer of sacral region, stage 4: Secondary | ICD-10-CM | POA: Diagnosis not present

## 2019-08-30 DIAGNOSIS — R339 Retention of urine, unspecified: Secondary | ICD-10-CM | POA: Diagnosis not present

## 2019-08-30 DIAGNOSIS — G8250 Quadriplegia, unspecified: Secondary | ICD-10-CM | POA: Diagnosis not present

## 2019-08-30 DIAGNOSIS — Z8616 Personal history of COVID-19: Secondary | ICD-10-CM | POA: Diagnosis not present

## 2019-09-02 DIAGNOSIS — Z7401 Bed confinement status: Secondary | ICD-10-CM | POA: Diagnosis not present

## 2019-09-02 DIAGNOSIS — Z87440 Personal history of urinary (tract) infections: Secondary | ICD-10-CM | POA: Diagnosis not present

## 2019-09-02 DIAGNOSIS — E1165 Type 2 diabetes mellitus with hyperglycemia: Secondary | ICD-10-CM | POA: Diagnosis not present

## 2019-09-02 DIAGNOSIS — Z8616 Personal history of COVID-19: Secondary | ICD-10-CM | POA: Diagnosis not present

## 2019-09-02 DIAGNOSIS — R000 Tachycardia, unspecified: Secondary | ICD-10-CM | POA: Diagnosis not present

## 2019-09-02 DIAGNOSIS — I69320 Aphasia following cerebral infarction: Secondary | ICD-10-CM | POA: Diagnosis not present

## 2019-09-02 DIAGNOSIS — R339 Retention of urine, unspecified: Secondary | ICD-10-CM | POA: Diagnosis not present

## 2019-09-02 DIAGNOSIS — L89154 Pressure ulcer of sacral region, stage 4: Secondary | ICD-10-CM | POA: Diagnosis not present

## 2019-09-02 DIAGNOSIS — I69365 Other paralytic syndrome following cerebral infarction, bilateral: Secondary | ICD-10-CM | POA: Diagnosis not present

## 2019-09-02 DIAGNOSIS — Z86718 Personal history of other venous thrombosis and embolism: Secondary | ICD-10-CM | POA: Diagnosis not present

## 2019-09-02 DIAGNOSIS — G4733 Obstructive sleep apnea (adult) (pediatric): Secondary | ICD-10-CM | POA: Diagnosis not present

## 2019-09-02 DIAGNOSIS — G40909 Epilepsy, unspecified, not intractable, without status epilepticus: Secondary | ICD-10-CM | POA: Diagnosis not present

## 2019-09-02 DIAGNOSIS — G8250 Quadriplegia, unspecified: Secondary | ICD-10-CM | POA: Diagnosis not present

## 2019-09-02 DIAGNOSIS — I10 Essential (primary) hypertension: Secondary | ICD-10-CM | POA: Diagnosis not present

## 2019-09-02 DIAGNOSIS — D649 Anemia, unspecified: Secondary | ICD-10-CM | POA: Diagnosis not present

## 2019-09-03 DIAGNOSIS — I10 Essential (primary) hypertension: Secondary | ICD-10-CM | POA: Diagnosis not present

## 2019-09-03 DIAGNOSIS — I69320 Aphasia following cerebral infarction: Secondary | ICD-10-CM | POA: Diagnosis not present

## 2019-09-03 DIAGNOSIS — R339 Retention of urine, unspecified: Secondary | ICD-10-CM | POA: Diagnosis not present

## 2019-09-03 DIAGNOSIS — L89154 Pressure ulcer of sacral region, stage 4: Secondary | ICD-10-CM | POA: Diagnosis not present

## 2019-09-03 DIAGNOSIS — G40909 Epilepsy, unspecified, not intractable, without status epilepticus: Secondary | ICD-10-CM | POA: Diagnosis not present

## 2019-09-03 DIAGNOSIS — I69365 Other paralytic syndrome following cerebral infarction, bilateral: Secondary | ICD-10-CM | POA: Diagnosis not present

## 2019-09-03 DIAGNOSIS — E1165 Type 2 diabetes mellitus with hyperglycemia: Secondary | ICD-10-CM | POA: Diagnosis not present

## 2019-09-03 DIAGNOSIS — Z86718 Personal history of other venous thrombosis and embolism: Secondary | ICD-10-CM | POA: Diagnosis not present

## 2019-09-03 DIAGNOSIS — R000 Tachycardia, unspecified: Secondary | ICD-10-CM | POA: Diagnosis not present

## 2019-09-03 DIAGNOSIS — Z87440 Personal history of urinary (tract) infections: Secondary | ICD-10-CM | POA: Diagnosis not present

## 2019-09-03 DIAGNOSIS — G8250 Quadriplegia, unspecified: Secondary | ICD-10-CM | POA: Diagnosis not present

## 2019-09-03 DIAGNOSIS — D649 Anemia, unspecified: Secondary | ICD-10-CM | POA: Diagnosis not present

## 2019-09-03 DIAGNOSIS — Z7401 Bed confinement status: Secondary | ICD-10-CM | POA: Diagnosis not present

## 2019-09-03 DIAGNOSIS — G4733 Obstructive sleep apnea (adult) (pediatric): Secondary | ICD-10-CM | POA: Diagnosis not present

## 2019-09-03 DIAGNOSIS — Z6841 Body Mass Index (BMI) 40.0 and over, adult: Secondary | ICD-10-CM | POA: Diagnosis not present

## 2019-09-04 DIAGNOSIS — Z7401 Bed confinement status: Secondary | ICD-10-CM | POA: Diagnosis not present

## 2019-09-04 DIAGNOSIS — R000 Tachycardia, unspecified: Secondary | ICD-10-CM | POA: Diagnosis not present

## 2019-09-04 DIAGNOSIS — R339 Retention of urine, unspecified: Secondary | ICD-10-CM | POA: Diagnosis not present

## 2019-09-04 DIAGNOSIS — I69365 Other paralytic syndrome following cerebral infarction, bilateral: Secondary | ICD-10-CM | POA: Diagnosis not present

## 2019-09-04 DIAGNOSIS — G40909 Epilepsy, unspecified, not intractable, without status epilepticus: Secondary | ICD-10-CM | POA: Diagnosis not present

## 2019-09-04 DIAGNOSIS — G8250 Quadriplegia, unspecified: Secondary | ICD-10-CM | POA: Diagnosis not present

## 2019-09-04 DIAGNOSIS — Z86718 Personal history of other venous thrombosis and embolism: Secondary | ICD-10-CM | POA: Diagnosis not present

## 2019-09-04 DIAGNOSIS — E1165 Type 2 diabetes mellitus with hyperglycemia: Secondary | ICD-10-CM | POA: Diagnosis not present

## 2019-09-04 DIAGNOSIS — D649 Anemia, unspecified: Secondary | ICD-10-CM | POA: Diagnosis not present

## 2019-09-04 DIAGNOSIS — L8994 Pressure ulcer of unspecified site, stage 4: Secondary | ICD-10-CM | POA: Diagnosis not present

## 2019-09-04 DIAGNOSIS — Z8616 Personal history of COVID-19: Secondary | ICD-10-CM | POA: Diagnosis not present

## 2019-09-04 DIAGNOSIS — G4733 Obstructive sleep apnea (adult) (pediatric): Secondary | ICD-10-CM | POA: Diagnosis not present

## 2019-09-04 DIAGNOSIS — Z87440 Personal history of urinary (tract) infections: Secondary | ICD-10-CM | POA: Diagnosis not present

## 2019-09-04 DIAGNOSIS — I6389 Other cerebral infarction: Secondary | ICD-10-CM | POA: Diagnosis not present

## 2019-09-04 DIAGNOSIS — I10 Essential (primary) hypertension: Secondary | ICD-10-CM | POA: Diagnosis not present

## 2019-09-04 DIAGNOSIS — L89154 Pressure ulcer of sacral region, stage 4: Secondary | ICD-10-CM | POA: Diagnosis not present

## 2019-09-04 DIAGNOSIS — I69320 Aphasia following cerebral infarction: Secondary | ICD-10-CM | POA: Diagnosis not present

## 2019-09-05 DIAGNOSIS — R339 Retention of urine, unspecified: Secondary | ICD-10-CM | POA: Diagnosis not present

## 2019-09-05 DIAGNOSIS — R000 Tachycardia, unspecified: Secondary | ICD-10-CM | POA: Diagnosis not present

## 2019-09-05 DIAGNOSIS — D649 Anemia, unspecified: Secondary | ICD-10-CM | POA: Diagnosis not present

## 2019-09-05 DIAGNOSIS — I10 Essential (primary) hypertension: Secondary | ICD-10-CM | POA: Diagnosis not present

## 2019-09-05 DIAGNOSIS — L89154 Pressure ulcer of sacral region, stage 4: Secondary | ICD-10-CM | POA: Diagnosis not present

## 2019-09-05 DIAGNOSIS — I69320 Aphasia following cerebral infarction: Secondary | ICD-10-CM | POA: Diagnosis not present

## 2019-09-05 DIAGNOSIS — G4733 Obstructive sleep apnea (adult) (pediatric): Secondary | ICD-10-CM | POA: Diagnosis not present

## 2019-09-05 DIAGNOSIS — G40909 Epilepsy, unspecified, not intractable, without status epilepticus: Secondary | ICD-10-CM | POA: Diagnosis not present

## 2019-09-05 DIAGNOSIS — G8250 Quadriplegia, unspecified: Secondary | ICD-10-CM | POA: Diagnosis not present

## 2019-09-05 DIAGNOSIS — Z6841 Body Mass Index (BMI) 40.0 and over, adult: Secondary | ICD-10-CM | POA: Diagnosis not present

## 2019-09-05 DIAGNOSIS — Z86718 Personal history of other venous thrombosis and embolism: Secondary | ICD-10-CM | POA: Diagnosis not present

## 2019-09-05 DIAGNOSIS — E1165 Type 2 diabetes mellitus with hyperglycemia: Secondary | ICD-10-CM | POA: Diagnosis not present

## 2019-09-05 DIAGNOSIS — Z7401 Bed confinement status: Secondary | ICD-10-CM | POA: Diagnosis not present

## 2019-09-05 DIAGNOSIS — Z87440 Personal history of urinary (tract) infections: Secondary | ICD-10-CM | POA: Diagnosis not present

## 2019-09-05 DIAGNOSIS — I69365 Other paralytic syndrome following cerebral infarction, bilateral: Secondary | ICD-10-CM | POA: Diagnosis not present

## 2019-09-06 DIAGNOSIS — L89154 Pressure ulcer of sacral region, stage 4: Secondary | ICD-10-CM | POA: Diagnosis not present

## 2019-09-06 DIAGNOSIS — E1165 Type 2 diabetes mellitus with hyperglycemia: Secondary | ICD-10-CM | POA: Diagnosis not present

## 2019-09-06 DIAGNOSIS — I10 Essential (primary) hypertension: Secondary | ICD-10-CM | POA: Diagnosis not present

## 2019-09-06 DIAGNOSIS — R339 Retention of urine, unspecified: Secondary | ICD-10-CM | POA: Diagnosis not present

## 2019-09-06 DIAGNOSIS — Z8616 Personal history of COVID-19: Secondary | ICD-10-CM | POA: Diagnosis not present

## 2019-09-06 DIAGNOSIS — D649 Anemia, unspecified: Secondary | ICD-10-CM | POA: Diagnosis not present

## 2019-09-06 DIAGNOSIS — G8250 Quadriplegia, unspecified: Secondary | ICD-10-CM | POA: Diagnosis not present

## 2019-09-06 DIAGNOSIS — R000 Tachycardia, unspecified: Secondary | ICD-10-CM | POA: Diagnosis not present

## 2019-09-06 DIAGNOSIS — Z87440 Personal history of urinary (tract) infections: Secondary | ICD-10-CM | POA: Diagnosis not present

## 2019-09-06 DIAGNOSIS — I69365 Other paralytic syndrome following cerebral infarction, bilateral: Secondary | ICD-10-CM | POA: Diagnosis not present

## 2019-09-06 DIAGNOSIS — I69320 Aphasia following cerebral infarction: Secondary | ICD-10-CM | POA: Diagnosis not present

## 2019-09-06 DIAGNOSIS — G4733 Obstructive sleep apnea (adult) (pediatric): Secondary | ICD-10-CM | POA: Diagnosis not present

## 2019-09-06 DIAGNOSIS — Z86718 Personal history of other venous thrombosis and embolism: Secondary | ICD-10-CM | POA: Diagnosis not present

## 2019-09-06 DIAGNOSIS — Z7401 Bed confinement status: Secondary | ICD-10-CM | POA: Diagnosis not present

## 2019-09-06 DIAGNOSIS — G40909 Epilepsy, unspecified, not intractable, without status epilepticus: Secondary | ICD-10-CM | POA: Diagnosis not present

## 2019-09-07 DIAGNOSIS — T8189XA Other complications of procedures, not elsewhere classified, initial encounter: Secondary | ICD-10-CM | POA: Diagnosis not present

## 2019-09-07 DIAGNOSIS — L89154 Pressure ulcer of sacral region, stage 4: Secondary | ICD-10-CM | POA: Diagnosis not present

## 2019-09-09 DIAGNOSIS — G40909 Epilepsy, unspecified, not intractable, without status epilepticus: Secondary | ICD-10-CM | POA: Diagnosis not present

## 2019-09-09 DIAGNOSIS — Z8616 Personal history of COVID-19: Secondary | ICD-10-CM | POA: Diagnosis not present

## 2019-09-09 DIAGNOSIS — G4733 Obstructive sleep apnea (adult) (pediatric): Secondary | ICD-10-CM | POA: Diagnosis not present

## 2019-09-09 DIAGNOSIS — I6389 Other cerebral infarction: Secondary | ICD-10-CM | POA: Diagnosis not present

## 2019-09-09 DIAGNOSIS — R339 Retention of urine, unspecified: Secondary | ICD-10-CM | POA: Diagnosis not present

## 2019-09-09 DIAGNOSIS — D649 Anemia, unspecified: Secondary | ICD-10-CM | POA: Diagnosis not present

## 2019-09-09 DIAGNOSIS — I10 Essential (primary) hypertension: Secondary | ICD-10-CM | POA: Diagnosis not present

## 2019-09-09 DIAGNOSIS — Z86718 Personal history of other venous thrombosis and embolism: Secondary | ICD-10-CM | POA: Diagnosis not present

## 2019-09-09 DIAGNOSIS — E1165 Type 2 diabetes mellitus with hyperglycemia: Secondary | ICD-10-CM | POA: Diagnosis not present

## 2019-09-09 DIAGNOSIS — G8250 Quadriplegia, unspecified: Secondary | ICD-10-CM | POA: Diagnosis not present

## 2019-09-09 DIAGNOSIS — I69365 Other paralytic syndrome following cerebral infarction, bilateral: Secondary | ICD-10-CM | POA: Diagnosis not present

## 2019-09-09 DIAGNOSIS — Z87440 Personal history of urinary (tract) infections: Secondary | ICD-10-CM | POA: Diagnosis not present

## 2019-09-09 DIAGNOSIS — R000 Tachycardia, unspecified: Secondary | ICD-10-CM | POA: Diagnosis not present

## 2019-09-09 DIAGNOSIS — Z7401 Bed confinement status: Secondary | ICD-10-CM | POA: Diagnosis not present

## 2019-09-09 DIAGNOSIS — L89154 Pressure ulcer of sacral region, stage 4: Secondary | ICD-10-CM | POA: Diagnosis not present

## 2019-09-09 DIAGNOSIS — I69320 Aphasia following cerebral infarction: Secondary | ICD-10-CM | POA: Diagnosis not present

## 2019-09-09 DIAGNOSIS — L8994 Pressure ulcer of unspecified site, stage 4: Secondary | ICD-10-CM | POA: Diagnosis not present

## 2019-09-10 DIAGNOSIS — R000 Tachycardia, unspecified: Secondary | ICD-10-CM | POA: Diagnosis not present

## 2019-09-10 DIAGNOSIS — L89154 Pressure ulcer of sacral region, stage 4: Secondary | ICD-10-CM | POA: Diagnosis not present

## 2019-09-10 DIAGNOSIS — Z87440 Personal history of urinary (tract) infections: Secondary | ICD-10-CM | POA: Diagnosis not present

## 2019-09-10 DIAGNOSIS — Z7401 Bed confinement status: Secondary | ICD-10-CM | POA: Diagnosis not present

## 2019-09-10 DIAGNOSIS — Z6841 Body Mass Index (BMI) 40.0 and over, adult: Secondary | ICD-10-CM | POA: Diagnosis not present

## 2019-09-10 DIAGNOSIS — Z86718 Personal history of other venous thrombosis and embolism: Secondary | ICD-10-CM | POA: Diagnosis not present

## 2019-09-10 DIAGNOSIS — G4733 Obstructive sleep apnea (adult) (pediatric): Secondary | ICD-10-CM | POA: Diagnosis not present

## 2019-09-10 DIAGNOSIS — I69320 Aphasia following cerebral infarction: Secondary | ICD-10-CM | POA: Diagnosis not present

## 2019-09-10 DIAGNOSIS — I10 Essential (primary) hypertension: Secondary | ICD-10-CM | POA: Diagnosis not present

## 2019-09-10 DIAGNOSIS — E1165 Type 2 diabetes mellitus with hyperglycemia: Secondary | ICD-10-CM | POA: Diagnosis not present

## 2019-09-10 DIAGNOSIS — G40909 Epilepsy, unspecified, not intractable, without status epilepticus: Secondary | ICD-10-CM | POA: Diagnosis not present

## 2019-09-10 DIAGNOSIS — R339 Retention of urine, unspecified: Secondary | ICD-10-CM | POA: Diagnosis not present

## 2019-09-10 DIAGNOSIS — G8250 Quadriplegia, unspecified: Secondary | ICD-10-CM | POA: Diagnosis not present

## 2019-09-10 DIAGNOSIS — I69365 Other paralytic syndrome following cerebral infarction, bilateral: Secondary | ICD-10-CM | POA: Diagnosis not present

## 2019-09-10 DIAGNOSIS — D649 Anemia, unspecified: Secondary | ICD-10-CM | POA: Diagnosis not present

## 2019-09-11 DIAGNOSIS — G8250 Quadriplegia, unspecified: Secondary | ICD-10-CM | POA: Diagnosis not present

## 2019-09-11 DIAGNOSIS — G40909 Epilepsy, unspecified, not intractable, without status epilepticus: Secondary | ICD-10-CM | POA: Diagnosis not present

## 2019-09-11 DIAGNOSIS — Z7401 Bed confinement status: Secondary | ICD-10-CM | POA: Diagnosis not present

## 2019-09-11 DIAGNOSIS — E1165 Type 2 diabetes mellitus with hyperglycemia: Secondary | ICD-10-CM | POA: Diagnosis not present

## 2019-09-11 DIAGNOSIS — I69365 Other paralytic syndrome following cerebral infarction, bilateral: Secondary | ICD-10-CM | POA: Diagnosis not present

## 2019-09-11 DIAGNOSIS — I10 Essential (primary) hypertension: Secondary | ICD-10-CM | POA: Diagnosis not present

## 2019-09-11 DIAGNOSIS — I69320 Aphasia following cerebral infarction: Secondary | ICD-10-CM | POA: Diagnosis not present

## 2019-09-11 DIAGNOSIS — L89154 Pressure ulcer of sacral region, stage 4: Secondary | ICD-10-CM | POA: Diagnosis not present

## 2019-09-11 DIAGNOSIS — D649 Anemia, unspecified: Secondary | ICD-10-CM | POA: Diagnosis not present

## 2019-09-11 DIAGNOSIS — Z8616 Personal history of COVID-19: Secondary | ICD-10-CM | POA: Diagnosis not present

## 2019-09-11 DIAGNOSIS — R000 Tachycardia, unspecified: Secondary | ICD-10-CM | POA: Diagnosis not present

## 2019-09-11 DIAGNOSIS — R339 Retention of urine, unspecified: Secondary | ICD-10-CM | POA: Diagnosis not present

## 2019-09-11 DIAGNOSIS — Z930 Tracheostomy status: Secondary | ICD-10-CM | POA: Diagnosis not present

## 2019-09-11 DIAGNOSIS — Z86718 Personal history of other venous thrombosis and embolism: Secondary | ICD-10-CM | POA: Diagnosis not present

## 2019-09-11 DIAGNOSIS — G4733 Obstructive sleep apnea (adult) (pediatric): Secondary | ICD-10-CM | POA: Diagnosis not present

## 2019-09-11 DIAGNOSIS — Z87440 Personal history of urinary (tract) infections: Secondary | ICD-10-CM | POA: Diagnosis not present

## 2019-09-12 DIAGNOSIS — Z86718 Personal history of other venous thrombosis and embolism: Secondary | ICD-10-CM | POA: Diagnosis not present

## 2019-09-12 DIAGNOSIS — I69320 Aphasia following cerebral infarction: Secondary | ICD-10-CM | POA: Diagnosis not present

## 2019-09-12 DIAGNOSIS — G4733 Obstructive sleep apnea (adult) (pediatric): Secondary | ICD-10-CM | POA: Diagnosis not present

## 2019-09-12 DIAGNOSIS — Z87440 Personal history of urinary (tract) infections: Secondary | ICD-10-CM | POA: Diagnosis not present

## 2019-09-12 DIAGNOSIS — G8250 Quadriplegia, unspecified: Secondary | ICD-10-CM | POA: Diagnosis not present

## 2019-09-12 DIAGNOSIS — E1165 Type 2 diabetes mellitus with hyperglycemia: Secondary | ICD-10-CM | POA: Diagnosis not present

## 2019-09-12 DIAGNOSIS — R339 Retention of urine, unspecified: Secondary | ICD-10-CM | POA: Diagnosis not present

## 2019-09-12 DIAGNOSIS — D649 Anemia, unspecified: Secondary | ICD-10-CM | POA: Diagnosis not present

## 2019-09-12 DIAGNOSIS — I10 Essential (primary) hypertension: Secondary | ICD-10-CM | POA: Diagnosis not present

## 2019-09-12 DIAGNOSIS — R000 Tachycardia, unspecified: Secondary | ICD-10-CM | POA: Diagnosis not present

## 2019-09-12 DIAGNOSIS — I69365 Other paralytic syndrome following cerebral infarction, bilateral: Secondary | ICD-10-CM | POA: Diagnosis not present

## 2019-09-12 DIAGNOSIS — Z6841 Body Mass Index (BMI) 40.0 and over, adult: Secondary | ICD-10-CM | POA: Diagnosis not present

## 2019-09-12 DIAGNOSIS — G40909 Epilepsy, unspecified, not intractable, without status epilepticus: Secondary | ICD-10-CM | POA: Diagnosis not present

## 2019-09-12 DIAGNOSIS — Z7401 Bed confinement status: Secondary | ICD-10-CM | POA: Diagnosis not present

## 2019-09-12 DIAGNOSIS — L89154 Pressure ulcer of sacral region, stage 4: Secondary | ICD-10-CM | POA: Diagnosis not present

## 2019-09-13 DIAGNOSIS — I10 Essential (primary) hypertension: Secondary | ICD-10-CM | POA: Diagnosis not present

## 2019-09-13 DIAGNOSIS — R000 Tachycardia, unspecified: Secondary | ICD-10-CM | POA: Diagnosis not present

## 2019-09-13 DIAGNOSIS — I69365 Other paralytic syndrome following cerebral infarction, bilateral: Secondary | ICD-10-CM | POA: Diagnosis not present

## 2019-09-13 DIAGNOSIS — Z86718 Personal history of other venous thrombosis and embolism: Secondary | ICD-10-CM | POA: Diagnosis not present

## 2019-09-13 DIAGNOSIS — L89154 Pressure ulcer of sacral region, stage 4: Secondary | ICD-10-CM | POA: Diagnosis not present

## 2019-09-13 DIAGNOSIS — G8250 Quadriplegia, unspecified: Secondary | ICD-10-CM | POA: Diagnosis not present

## 2019-09-13 DIAGNOSIS — G4733 Obstructive sleep apnea (adult) (pediatric): Secondary | ICD-10-CM | POA: Diagnosis not present

## 2019-09-13 DIAGNOSIS — D649 Anemia, unspecified: Secondary | ICD-10-CM | POA: Diagnosis not present

## 2019-09-13 DIAGNOSIS — G40909 Epilepsy, unspecified, not intractable, without status epilepticus: Secondary | ICD-10-CM | POA: Diagnosis not present

## 2019-09-13 DIAGNOSIS — R339 Retention of urine, unspecified: Secondary | ICD-10-CM | POA: Diagnosis not present

## 2019-09-13 DIAGNOSIS — Z8616 Personal history of COVID-19: Secondary | ICD-10-CM | POA: Diagnosis not present

## 2019-09-13 DIAGNOSIS — E1165 Type 2 diabetes mellitus with hyperglycemia: Secondary | ICD-10-CM | POA: Diagnosis not present

## 2019-09-13 DIAGNOSIS — Z87440 Personal history of urinary (tract) infections: Secondary | ICD-10-CM | POA: Diagnosis not present

## 2019-09-13 DIAGNOSIS — I69320 Aphasia following cerebral infarction: Secondary | ICD-10-CM | POA: Diagnosis not present

## 2019-09-13 DIAGNOSIS — Z7401 Bed confinement status: Secondary | ICD-10-CM | POA: Diagnosis not present

## 2019-09-15 DIAGNOSIS — Z930 Tracheostomy status: Secondary | ICD-10-CM | POA: Diagnosis not present

## 2019-09-16 DIAGNOSIS — I69320 Aphasia following cerebral infarction: Secondary | ICD-10-CM | POA: Diagnosis not present

## 2019-09-16 DIAGNOSIS — I10 Essential (primary) hypertension: Secondary | ICD-10-CM | POA: Diagnosis not present

## 2019-09-16 DIAGNOSIS — E1165 Type 2 diabetes mellitus with hyperglycemia: Secondary | ICD-10-CM | POA: Diagnosis not present

## 2019-09-16 DIAGNOSIS — L89154 Pressure ulcer of sacral region, stage 4: Secondary | ICD-10-CM | POA: Diagnosis not present

## 2019-09-16 DIAGNOSIS — G4733 Obstructive sleep apnea (adult) (pediatric): Secondary | ICD-10-CM | POA: Diagnosis not present

## 2019-09-16 DIAGNOSIS — I69365 Other paralytic syndrome following cerebral infarction, bilateral: Secondary | ICD-10-CM | POA: Diagnosis not present

## 2019-09-16 DIAGNOSIS — Z86718 Personal history of other venous thrombosis and embolism: Secondary | ICD-10-CM | POA: Diagnosis not present

## 2019-09-16 DIAGNOSIS — Z7401 Bed confinement status: Secondary | ICD-10-CM | POA: Diagnosis not present

## 2019-09-16 DIAGNOSIS — G8250 Quadriplegia, unspecified: Secondary | ICD-10-CM | POA: Diagnosis not present

## 2019-09-16 DIAGNOSIS — I6389 Other cerebral infarction: Secondary | ICD-10-CM | POA: Diagnosis not present

## 2019-09-16 DIAGNOSIS — R000 Tachycardia, unspecified: Secondary | ICD-10-CM | POA: Diagnosis not present

## 2019-09-16 DIAGNOSIS — G40909 Epilepsy, unspecified, not intractable, without status epilepticus: Secondary | ICD-10-CM | POA: Diagnosis not present

## 2019-09-16 DIAGNOSIS — D649 Anemia, unspecified: Secondary | ICD-10-CM | POA: Diagnosis not present

## 2019-09-16 DIAGNOSIS — Z8616 Personal history of COVID-19: Secondary | ICD-10-CM | POA: Diagnosis not present

## 2019-09-16 DIAGNOSIS — R339 Retention of urine, unspecified: Secondary | ICD-10-CM | POA: Diagnosis not present

## 2019-09-16 DIAGNOSIS — L8994 Pressure ulcer of unspecified site, stage 4: Secondary | ICD-10-CM | POA: Diagnosis not present

## 2019-09-16 DIAGNOSIS — Z87440 Personal history of urinary (tract) infections: Secondary | ICD-10-CM | POA: Diagnosis not present

## 2019-09-17 DIAGNOSIS — L89154 Pressure ulcer of sacral region, stage 4: Secondary | ICD-10-CM | POA: Diagnosis not present

## 2019-09-17 DIAGNOSIS — Z7401 Bed confinement status: Secondary | ICD-10-CM | POA: Diagnosis not present

## 2019-09-17 DIAGNOSIS — D649 Anemia, unspecified: Secondary | ICD-10-CM | POA: Diagnosis not present

## 2019-09-17 DIAGNOSIS — Z6841 Body Mass Index (BMI) 40.0 and over, adult: Secondary | ICD-10-CM | POA: Diagnosis not present

## 2019-09-17 DIAGNOSIS — R339 Retention of urine, unspecified: Secondary | ICD-10-CM | POA: Diagnosis not present

## 2019-09-17 DIAGNOSIS — E1165 Type 2 diabetes mellitus with hyperglycemia: Secondary | ICD-10-CM | POA: Diagnosis not present

## 2019-09-17 DIAGNOSIS — R000 Tachycardia, unspecified: Secondary | ICD-10-CM | POA: Diagnosis not present

## 2019-09-17 DIAGNOSIS — G8250 Quadriplegia, unspecified: Secondary | ICD-10-CM | POA: Diagnosis not present

## 2019-09-17 DIAGNOSIS — I69320 Aphasia following cerebral infarction: Secondary | ICD-10-CM | POA: Diagnosis not present

## 2019-09-17 DIAGNOSIS — Z86718 Personal history of other venous thrombosis and embolism: Secondary | ICD-10-CM | POA: Diagnosis not present

## 2019-09-17 DIAGNOSIS — G4733 Obstructive sleep apnea (adult) (pediatric): Secondary | ICD-10-CM | POA: Diagnosis not present

## 2019-09-17 DIAGNOSIS — I10 Essential (primary) hypertension: Secondary | ICD-10-CM | POA: Diagnosis not present

## 2019-09-17 DIAGNOSIS — Z87440 Personal history of urinary (tract) infections: Secondary | ICD-10-CM | POA: Diagnosis not present

## 2019-09-17 DIAGNOSIS — I69365 Other paralytic syndrome following cerebral infarction, bilateral: Secondary | ICD-10-CM | POA: Diagnosis not present

## 2019-09-17 DIAGNOSIS — G40909 Epilepsy, unspecified, not intractable, without status epilepticus: Secondary | ICD-10-CM | POA: Diagnosis not present

## 2019-09-18 DIAGNOSIS — L89154 Pressure ulcer of sacral region, stage 4: Secondary | ICD-10-CM | POA: Diagnosis not present

## 2019-09-18 DIAGNOSIS — G40909 Epilepsy, unspecified, not intractable, without status epilepticus: Secondary | ICD-10-CM | POA: Diagnosis not present

## 2019-09-18 DIAGNOSIS — G4733 Obstructive sleep apnea (adult) (pediatric): Secondary | ICD-10-CM | POA: Diagnosis not present

## 2019-09-18 DIAGNOSIS — Z87440 Personal history of urinary (tract) infections: Secondary | ICD-10-CM | POA: Diagnosis not present

## 2019-09-18 DIAGNOSIS — I69365 Other paralytic syndrome following cerebral infarction, bilateral: Secondary | ICD-10-CM | POA: Diagnosis not present

## 2019-09-18 DIAGNOSIS — R000 Tachycardia, unspecified: Secondary | ICD-10-CM | POA: Diagnosis not present

## 2019-09-18 DIAGNOSIS — G8250 Quadriplegia, unspecified: Secondary | ICD-10-CM | POA: Diagnosis not present

## 2019-09-18 DIAGNOSIS — I69320 Aphasia following cerebral infarction: Secondary | ICD-10-CM | POA: Diagnosis not present

## 2019-09-18 DIAGNOSIS — R339 Retention of urine, unspecified: Secondary | ICD-10-CM | POA: Diagnosis not present

## 2019-09-18 DIAGNOSIS — Z8616 Personal history of COVID-19: Secondary | ICD-10-CM | POA: Diagnosis not present

## 2019-09-18 DIAGNOSIS — Z86718 Personal history of other venous thrombosis and embolism: Secondary | ICD-10-CM | POA: Diagnosis not present

## 2019-09-18 DIAGNOSIS — E1165 Type 2 diabetes mellitus with hyperglycemia: Secondary | ICD-10-CM | POA: Diagnosis not present

## 2019-09-18 DIAGNOSIS — Z7401 Bed confinement status: Secondary | ICD-10-CM | POA: Diagnosis not present

## 2019-09-18 DIAGNOSIS — D649 Anemia, unspecified: Secondary | ICD-10-CM | POA: Diagnosis not present

## 2019-09-18 DIAGNOSIS — I10 Essential (primary) hypertension: Secondary | ICD-10-CM | POA: Diagnosis not present

## 2019-09-19 DIAGNOSIS — R000 Tachycardia, unspecified: Secondary | ICD-10-CM | POA: Diagnosis not present

## 2019-09-19 DIAGNOSIS — I10 Essential (primary) hypertension: Secondary | ICD-10-CM | POA: Diagnosis not present

## 2019-09-19 DIAGNOSIS — Z6841 Body Mass Index (BMI) 40.0 and over, adult: Secondary | ICD-10-CM | POA: Diagnosis not present

## 2019-09-19 DIAGNOSIS — G4733 Obstructive sleep apnea (adult) (pediatric): Secondary | ICD-10-CM | POA: Diagnosis not present

## 2019-09-19 DIAGNOSIS — Z86718 Personal history of other venous thrombosis and embolism: Secondary | ICD-10-CM | POA: Diagnosis not present

## 2019-09-19 DIAGNOSIS — E1165 Type 2 diabetes mellitus with hyperglycemia: Secondary | ICD-10-CM | POA: Diagnosis not present

## 2019-09-19 DIAGNOSIS — I69320 Aphasia following cerebral infarction: Secondary | ICD-10-CM | POA: Diagnosis not present

## 2019-09-19 DIAGNOSIS — R339 Retention of urine, unspecified: Secondary | ICD-10-CM | POA: Diagnosis not present

## 2019-09-19 DIAGNOSIS — Z7401 Bed confinement status: Secondary | ICD-10-CM | POA: Diagnosis not present

## 2019-09-19 DIAGNOSIS — G8250 Quadriplegia, unspecified: Secondary | ICD-10-CM | POA: Diagnosis not present

## 2019-09-19 DIAGNOSIS — I69365 Other paralytic syndrome following cerebral infarction, bilateral: Secondary | ICD-10-CM | POA: Diagnosis not present

## 2019-09-19 DIAGNOSIS — L89154 Pressure ulcer of sacral region, stage 4: Secondary | ICD-10-CM | POA: Diagnosis not present

## 2019-09-19 DIAGNOSIS — G40909 Epilepsy, unspecified, not intractable, without status epilepticus: Secondary | ICD-10-CM | POA: Diagnosis not present

## 2019-09-19 DIAGNOSIS — D649 Anemia, unspecified: Secondary | ICD-10-CM | POA: Diagnosis not present

## 2019-09-19 DIAGNOSIS — Z87440 Personal history of urinary (tract) infections: Secondary | ICD-10-CM | POA: Diagnosis not present

## 2019-09-20 DIAGNOSIS — D649 Anemia, unspecified: Secondary | ICD-10-CM | POA: Diagnosis not present

## 2019-09-20 DIAGNOSIS — R339 Retention of urine, unspecified: Secondary | ICD-10-CM | POA: Diagnosis not present

## 2019-09-20 DIAGNOSIS — I69365 Other paralytic syndrome following cerebral infarction, bilateral: Secondary | ICD-10-CM | POA: Diagnosis not present

## 2019-09-20 DIAGNOSIS — G4733 Obstructive sleep apnea (adult) (pediatric): Secondary | ICD-10-CM | POA: Diagnosis not present

## 2019-09-20 DIAGNOSIS — Z87440 Personal history of urinary (tract) infections: Secondary | ICD-10-CM | POA: Diagnosis not present

## 2019-09-20 DIAGNOSIS — L89154 Pressure ulcer of sacral region, stage 4: Secondary | ICD-10-CM | POA: Diagnosis not present

## 2019-09-20 DIAGNOSIS — Z8616 Personal history of COVID-19: Secondary | ICD-10-CM | POA: Diagnosis not present

## 2019-09-20 DIAGNOSIS — E1165 Type 2 diabetes mellitus with hyperglycemia: Secondary | ICD-10-CM | POA: Diagnosis not present

## 2019-09-20 DIAGNOSIS — I10 Essential (primary) hypertension: Secondary | ICD-10-CM | POA: Diagnosis not present

## 2019-09-20 DIAGNOSIS — Z86718 Personal history of other venous thrombosis and embolism: Secondary | ICD-10-CM | POA: Diagnosis not present

## 2019-09-20 DIAGNOSIS — R000 Tachycardia, unspecified: Secondary | ICD-10-CM | POA: Diagnosis not present

## 2019-09-20 DIAGNOSIS — G8250 Quadriplegia, unspecified: Secondary | ICD-10-CM | POA: Diagnosis not present

## 2019-09-20 DIAGNOSIS — G40909 Epilepsy, unspecified, not intractable, without status epilepticus: Secondary | ICD-10-CM | POA: Diagnosis not present

## 2019-09-20 DIAGNOSIS — Z7401 Bed confinement status: Secondary | ICD-10-CM | POA: Diagnosis not present

## 2019-09-20 DIAGNOSIS — I69320 Aphasia following cerebral infarction: Secondary | ICD-10-CM | POA: Diagnosis not present

## 2019-09-23 DIAGNOSIS — G4733 Obstructive sleep apnea (adult) (pediatric): Secondary | ICD-10-CM | POA: Diagnosis not present

## 2019-09-23 DIAGNOSIS — Z8616 Personal history of COVID-19: Secondary | ICD-10-CM | POA: Diagnosis not present

## 2019-09-23 DIAGNOSIS — G8250 Quadriplegia, unspecified: Secondary | ICD-10-CM | POA: Diagnosis not present

## 2019-09-23 DIAGNOSIS — I69365 Other paralytic syndrome following cerebral infarction, bilateral: Secondary | ICD-10-CM | POA: Diagnosis not present

## 2019-09-23 DIAGNOSIS — R000 Tachycardia, unspecified: Secondary | ICD-10-CM | POA: Diagnosis not present

## 2019-09-23 DIAGNOSIS — D649 Anemia, unspecified: Secondary | ICD-10-CM | POA: Diagnosis not present

## 2019-09-23 DIAGNOSIS — L89154 Pressure ulcer of sacral region, stage 4: Secondary | ICD-10-CM | POA: Diagnosis not present

## 2019-09-23 DIAGNOSIS — Z7401 Bed confinement status: Secondary | ICD-10-CM | POA: Diagnosis not present

## 2019-09-23 DIAGNOSIS — G40909 Epilepsy, unspecified, not intractable, without status epilepticus: Secondary | ICD-10-CM | POA: Diagnosis not present

## 2019-09-23 DIAGNOSIS — Z87440 Personal history of urinary (tract) infections: Secondary | ICD-10-CM | POA: Diagnosis not present

## 2019-09-23 DIAGNOSIS — Z86718 Personal history of other venous thrombosis and embolism: Secondary | ICD-10-CM | POA: Diagnosis not present

## 2019-09-23 DIAGNOSIS — I69320 Aphasia following cerebral infarction: Secondary | ICD-10-CM | POA: Diagnosis not present

## 2019-09-23 DIAGNOSIS — R339 Retention of urine, unspecified: Secondary | ICD-10-CM | POA: Diagnosis not present

## 2019-09-23 DIAGNOSIS — I10 Essential (primary) hypertension: Secondary | ICD-10-CM | POA: Diagnosis not present

## 2019-09-23 DIAGNOSIS — E1165 Type 2 diabetes mellitus with hyperglycemia: Secondary | ICD-10-CM | POA: Diagnosis not present

## 2019-09-24 DIAGNOSIS — G4733 Obstructive sleep apnea (adult) (pediatric): Secondary | ICD-10-CM | POA: Diagnosis not present

## 2019-09-24 DIAGNOSIS — Z6841 Body Mass Index (BMI) 40.0 and over, adult: Secondary | ICD-10-CM | POA: Diagnosis not present

## 2019-09-24 DIAGNOSIS — I69365 Other paralytic syndrome following cerebral infarction, bilateral: Secondary | ICD-10-CM | POA: Diagnosis not present

## 2019-09-24 DIAGNOSIS — L89154 Pressure ulcer of sacral region, stage 4: Secondary | ICD-10-CM | POA: Diagnosis not present

## 2019-09-24 DIAGNOSIS — I10 Essential (primary) hypertension: Secondary | ICD-10-CM | POA: Diagnosis not present

## 2019-09-24 DIAGNOSIS — G8250 Quadriplegia, unspecified: Secondary | ICD-10-CM | POA: Diagnosis not present

## 2019-09-24 DIAGNOSIS — R000 Tachycardia, unspecified: Secondary | ICD-10-CM | POA: Diagnosis not present

## 2019-09-24 DIAGNOSIS — R339 Retention of urine, unspecified: Secondary | ICD-10-CM | POA: Diagnosis not present

## 2019-09-24 DIAGNOSIS — G40909 Epilepsy, unspecified, not intractable, without status epilepticus: Secondary | ICD-10-CM | POA: Diagnosis not present

## 2019-09-24 DIAGNOSIS — I69320 Aphasia following cerebral infarction: Secondary | ICD-10-CM | POA: Diagnosis not present

## 2019-09-24 DIAGNOSIS — D649 Anemia, unspecified: Secondary | ICD-10-CM | POA: Diagnosis not present

## 2019-09-24 DIAGNOSIS — E1165 Type 2 diabetes mellitus with hyperglycemia: Secondary | ICD-10-CM | POA: Diagnosis not present

## 2019-09-24 DIAGNOSIS — Z7401 Bed confinement status: Secondary | ICD-10-CM | POA: Diagnosis not present

## 2019-09-24 DIAGNOSIS — Z86718 Personal history of other venous thrombosis and embolism: Secondary | ICD-10-CM | POA: Diagnosis not present

## 2019-09-24 DIAGNOSIS — Z87440 Personal history of urinary (tract) infections: Secondary | ICD-10-CM | POA: Diagnosis not present

## 2019-09-25 DIAGNOSIS — G4733 Obstructive sleep apnea (adult) (pediatric): Secondary | ICD-10-CM | POA: Diagnosis not present

## 2019-09-25 DIAGNOSIS — Z86718 Personal history of other venous thrombosis and embolism: Secondary | ICD-10-CM | POA: Diagnosis not present

## 2019-09-25 DIAGNOSIS — I10 Essential (primary) hypertension: Secondary | ICD-10-CM | POA: Diagnosis not present

## 2019-09-25 DIAGNOSIS — R000 Tachycardia, unspecified: Secondary | ICD-10-CM | POA: Diagnosis not present

## 2019-09-25 DIAGNOSIS — R339 Retention of urine, unspecified: Secondary | ICD-10-CM | POA: Diagnosis not present

## 2019-09-25 DIAGNOSIS — Z87440 Personal history of urinary (tract) infections: Secondary | ICD-10-CM | POA: Diagnosis not present

## 2019-09-25 DIAGNOSIS — L89154 Pressure ulcer of sacral region, stage 4: Secondary | ICD-10-CM | POA: Diagnosis not present

## 2019-09-25 DIAGNOSIS — I69365 Other paralytic syndrome following cerebral infarction, bilateral: Secondary | ICD-10-CM | POA: Diagnosis not present

## 2019-09-25 DIAGNOSIS — G8250 Quadriplegia, unspecified: Secondary | ICD-10-CM | POA: Diagnosis not present

## 2019-09-25 DIAGNOSIS — G40909 Epilepsy, unspecified, not intractable, without status epilepticus: Secondary | ICD-10-CM | POA: Diagnosis not present

## 2019-09-25 DIAGNOSIS — Z8616 Personal history of COVID-19: Secondary | ICD-10-CM | POA: Diagnosis not present

## 2019-09-25 DIAGNOSIS — Z7401 Bed confinement status: Secondary | ICD-10-CM | POA: Diagnosis not present

## 2019-09-25 DIAGNOSIS — I69320 Aphasia following cerebral infarction: Secondary | ICD-10-CM | POA: Diagnosis not present

## 2019-09-25 DIAGNOSIS — E1165 Type 2 diabetes mellitus with hyperglycemia: Secondary | ICD-10-CM | POA: Diagnosis not present

## 2019-09-25 DIAGNOSIS — D649 Anemia, unspecified: Secondary | ICD-10-CM | POA: Diagnosis not present

## 2019-09-26 DIAGNOSIS — Z86718 Personal history of other venous thrombosis and embolism: Secondary | ICD-10-CM | POA: Diagnosis not present

## 2019-09-26 DIAGNOSIS — D649 Anemia, unspecified: Secondary | ICD-10-CM | POA: Diagnosis not present

## 2019-09-26 DIAGNOSIS — I10 Essential (primary) hypertension: Secondary | ICD-10-CM | POA: Diagnosis not present

## 2019-09-26 DIAGNOSIS — E1165 Type 2 diabetes mellitus with hyperglycemia: Secondary | ICD-10-CM | POA: Diagnosis not present

## 2019-09-26 DIAGNOSIS — G4733 Obstructive sleep apnea (adult) (pediatric): Secondary | ICD-10-CM | POA: Diagnosis not present

## 2019-09-26 DIAGNOSIS — Z7401 Bed confinement status: Secondary | ICD-10-CM | POA: Diagnosis not present

## 2019-09-26 DIAGNOSIS — G40909 Epilepsy, unspecified, not intractable, without status epilepticus: Secondary | ICD-10-CM | POA: Diagnosis not present

## 2019-09-26 DIAGNOSIS — R339 Retention of urine, unspecified: Secondary | ICD-10-CM | POA: Diagnosis not present

## 2019-09-26 DIAGNOSIS — I69320 Aphasia following cerebral infarction: Secondary | ICD-10-CM | POA: Diagnosis not present

## 2019-09-26 DIAGNOSIS — R000 Tachycardia, unspecified: Secondary | ICD-10-CM | POA: Diagnosis not present

## 2019-09-26 DIAGNOSIS — Z6841 Body Mass Index (BMI) 40.0 and over, adult: Secondary | ICD-10-CM | POA: Diagnosis not present

## 2019-09-26 DIAGNOSIS — G8250 Quadriplegia, unspecified: Secondary | ICD-10-CM | POA: Diagnosis not present

## 2019-09-26 DIAGNOSIS — I69365 Other paralytic syndrome following cerebral infarction, bilateral: Secondary | ICD-10-CM | POA: Diagnosis not present

## 2019-09-26 DIAGNOSIS — Z87440 Personal history of urinary (tract) infections: Secondary | ICD-10-CM | POA: Diagnosis not present

## 2019-09-26 DIAGNOSIS — L89154 Pressure ulcer of sacral region, stage 4: Secondary | ICD-10-CM | POA: Diagnosis not present

## 2019-09-27 DIAGNOSIS — I10 Essential (primary) hypertension: Secondary | ICD-10-CM | POA: Diagnosis not present

## 2019-09-27 DIAGNOSIS — I69320 Aphasia following cerebral infarction: Secondary | ICD-10-CM | POA: Diagnosis not present

## 2019-09-27 DIAGNOSIS — L89154 Pressure ulcer of sacral region, stage 4: Secondary | ICD-10-CM | POA: Diagnosis not present

## 2019-09-27 DIAGNOSIS — Z8616 Personal history of COVID-19: Secondary | ICD-10-CM | POA: Diagnosis not present

## 2019-09-27 DIAGNOSIS — Z7401 Bed confinement status: Secondary | ICD-10-CM | POA: Diagnosis not present

## 2019-09-27 DIAGNOSIS — I69365 Other paralytic syndrome following cerebral infarction, bilateral: Secondary | ICD-10-CM | POA: Diagnosis not present

## 2019-09-27 DIAGNOSIS — E1165 Type 2 diabetes mellitus with hyperglycemia: Secondary | ICD-10-CM | POA: Diagnosis not present

## 2019-09-27 DIAGNOSIS — G40909 Epilepsy, unspecified, not intractable, without status epilepticus: Secondary | ICD-10-CM | POA: Diagnosis not present

## 2019-09-27 DIAGNOSIS — D649 Anemia, unspecified: Secondary | ICD-10-CM | POA: Diagnosis not present

## 2019-09-27 DIAGNOSIS — Z6841 Body Mass Index (BMI) 40.0 and over, adult: Secondary | ICD-10-CM | POA: Diagnosis not present

## 2019-09-27 DIAGNOSIS — G8250 Quadriplegia, unspecified: Secondary | ICD-10-CM | POA: Diagnosis not present

## 2019-09-27 DIAGNOSIS — R339 Retention of urine, unspecified: Secondary | ICD-10-CM | POA: Diagnosis not present

## 2019-09-27 DIAGNOSIS — Z86718 Personal history of other venous thrombosis and embolism: Secondary | ICD-10-CM | POA: Diagnosis not present

## 2019-09-27 DIAGNOSIS — Z87440 Personal history of urinary (tract) infections: Secondary | ICD-10-CM | POA: Diagnosis not present

## 2019-09-27 DIAGNOSIS — R000 Tachycardia, unspecified: Secondary | ICD-10-CM | POA: Diagnosis not present

## 2019-09-27 DIAGNOSIS — G4733 Obstructive sleep apnea (adult) (pediatric): Secondary | ICD-10-CM | POA: Diagnosis not present

## 2019-09-29 DIAGNOSIS — T8189XA Other complications of procedures, not elsewhere classified, initial encounter: Secondary | ICD-10-CM | POA: Diagnosis not present

## 2019-09-30 DIAGNOSIS — D649 Anemia, unspecified: Secondary | ICD-10-CM | POA: Diagnosis not present

## 2019-09-30 DIAGNOSIS — I10 Essential (primary) hypertension: Secondary | ICD-10-CM | POA: Diagnosis not present

## 2019-09-30 DIAGNOSIS — R339 Retention of urine, unspecified: Secondary | ICD-10-CM | POA: Diagnosis not present

## 2019-09-30 DIAGNOSIS — G40909 Epilepsy, unspecified, not intractable, without status epilepticus: Secondary | ICD-10-CM | POA: Diagnosis not present

## 2019-09-30 DIAGNOSIS — E1165 Type 2 diabetes mellitus with hyperglycemia: Secondary | ICD-10-CM | POA: Diagnosis not present

## 2019-09-30 DIAGNOSIS — Z8616 Personal history of COVID-19: Secondary | ICD-10-CM | POA: Diagnosis not present

## 2019-09-30 DIAGNOSIS — L8994 Pressure ulcer of unspecified site, stage 4: Secondary | ICD-10-CM | POA: Diagnosis not present

## 2019-09-30 DIAGNOSIS — G4733 Obstructive sleep apnea (adult) (pediatric): Secondary | ICD-10-CM | POA: Diagnosis not present

## 2019-09-30 DIAGNOSIS — R000 Tachycardia, unspecified: Secondary | ICD-10-CM | POA: Diagnosis not present

## 2019-09-30 DIAGNOSIS — Z86718 Personal history of other venous thrombosis and embolism: Secondary | ICD-10-CM | POA: Diagnosis not present

## 2019-09-30 DIAGNOSIS — L89154 Pressure ulcer of sacral region, stage 4: Secondary | ICD-10-CM | POA: Diagnosis not present

## 2019-09-30 DIAGNOSIS — Z87440 Personal history of urinary (tract) infections: Secondary | ICD-10-CM | POA: Diagnosis not present

## 2019-09-30 DIAGNOSIS — I69320 Aphasia following cerebral infarction: Secondary | ICD-10-CM | POA: Diagnosis not present

## 2019-09-30 DIAGNOSIS — I69365 Other paralytic syndrome following cerebral infarction, bilateral: Secondary | ICD-10-CM | POA: Diagnosis not present

## 2019-09-30 DIAGNOSIS — G8250 Quadriplegia, unspecified: Secondary | ICD-10-CM | POA: Diagnosis not present

## 2019-09-30 DIAGNOSIS — I6389 Other cerebral infarction: Secondary | ICD-10-CM | POA: Diagnosis not present

## 2019-09-30 DIAGNOSIS — Z7401 Bed confinement status: Secondary | ICD-10-CM | POA: Diagnosis not present

## 2019-10-01 DIAGNOSIS — I69365 Other paralytic syndrome following cerebral infarction, bilateral: Secondary | ICD-10-CM | POA: Diagnosis not present

## 2019-10-01 DIAGNOSIS — Z7401 Bed confinement status: Secondary | ICD-10-CM | POA: Diagnosis not present

## 2019-10-01 DIAGNOSIS — E1165 Type 2 diabetes mellitus with hyperglycemia: Secondary | ICD-10-CM | POA: Diagnosis not present

## 2019-10-01 DIAGNOSIS — R000 Tachycardia, unspecified: Secondary | ICD-10-CM | POA: Diagnosis not present

## 2019-10-01 DIAGNOSIS — L89154 Pressure ulcer of sacral region, stage 4: Secondary | ICD-10-CM | POA: Diagnosis not present

## 2019-10-01 DIAGNOSIS — G4733 Obstructive sleep apnea (adult) (pediatric): Secondary | ICD-10-CM | POA: Diagnosis not present

## 2019-10-01 DIAGNOSIS — G8250 Quadriplegia, unspecified: Secondary | ICD-10-CM | POA: Diagnosis not present

## 2019-10-01 DIAGNOSIS — D649 Anemia, unspecified: Secondary | ICD-10-CM | POA: Diagnosis not present

## 2019-10-01 DIAGNOSIS — G40909 Epilepsy, unspecified, not intractable, without status epilepticus: Secondary | ICD-10-CM | POA: Diagnosis not present

## 2019-10-01 DIAGNOSIS — I69320 Aphasia following cerebral infarction: Secondary | ICD-10-CM | POA: Diagnosis not present

## 2019-10-01 DIAGNOSIS — Z86718 Personal history of other venous thrombosis and embolism: Secondary | ICD-10-CM | POA: Diagnosis not present

## 2019-10-01 DIAGNOSIS — Z8616 Personal history of COVID-19: Secondary | ICD-10-CM | POA: Diagnosis not present

## 2019-10-01 DIAGNOSIS — R339 Retention of urine, unspecified: Secondary | ICD-10-CM | POA: Diagnosis not present

## 2019-10-01 DIAGNOSIS — Z87440 Personal history of urinary (tract) infections: Secondary | ICD-10-CM | POA: Diagnosis not present

## 2019-10-01 DIAGNOSIS — I10 Essential (primary) hypertension: Secondary | ICD-10-CM | POA: Diagnosis not present

## 2019-10-02 DIAGNOSIS — Z6841 Body Mass Index (BMI) 40.0 and over, adult: Secondary | ICD-10-CM | POA: Diagnosis not present

## 2019-10-02 DIAGNOSIS — G8250 Quadriplegia, unspecified: Secondary | ICD-10-CM | POA: Diagnosis not present

## 2019-10-02 DIAGNOSIS — I69365 Other paralytic syndrome following cerebral infarction, bilateral: Secondary | ICD-10-CM | POA: Diagnosis not present

## 2019-10-02 DIAGNOSIS — I69320 Aphasia following cerebral infarction: Secondary | ICD-10-CM | POA: Diagnosis not present

## 2019-10-02 DIAGNOSIS — I10 Essential (primary) hypertension: Secondary | ICD-10-CM | POA: Diagnosis not present

## 2019-10-02 DIAGNOSIS — R339 Retention of urine, unspecified: Secondary | ICD-10-CM | POA: Diagnosis not present

## 2019-10-02 DIAGNOSIS — L89154 Pressure ulcer of sacral region, stage 4: Secondary | ICD-10-CM | POA: Diagnosis not present

## 2019-10-02 DIAGNOSIS — G40909 Epilepsy, unspecified, not intractable, without status epilepticus: Secondary | ICD-10-CM | POA: Diagnosis not present

## 2019-10-02 DIAGNOSIS — D649 Anemia, unspecified: Secondary | ICD-10-CM | POA: Diagnosis not present

## 2019-10-02 DIAGNOSIS — Z86718 Personal history of other venous thrombosis and embolism: Secondary | ICD-10-CM | POA: Diagnosis not present

## 2019-10-02 DIAGNOSIS — G4733 Obstructive sleep apnea (adult) (pediatric): Secondary | ICD-10-CM | POA: Diagnosis not present

## 2019-10-02 DIAGNOSIS — R000 Tachycardia, unspecified: Secondary | ICD-10-CM | POA: Diagnosis not present

## 2019-10-02 DIAGNOSIS — Z87440 Personal history of urinary (tract) infections: Secondary | ICD-10-CM | POA: Diagnosis not present

## 2019-10-02 DIAGNOSIS — E1165 Type 2 diabetes mellitus with hyperglycemia: Secondary | ICD-10-CM | POA: Diagnosis not present

## 2019-10-02 DIAGNOSIS — Z7401 Bed confinement status: Secondary | ICD-10-CM | POA: Diagnosis not present

## 2019-10-03 DIAGNOSIS — L89154 Pressure ulcer of sacral region, stage 4: Secondary | ICD-10-CM | POA: Diagnosis not present

## 2019-10-03 DIAGNOSIS — Z7401 Bed confinement status: Secondary | ICD-10-CM | POA: Diagnosis not present

## 2019-10-03 DIAGNOSIS — D649 Anemia, unspecified: Secondary | ICD-10-CM | POA: Diagnosis not present

## 2019-10-03 DIAGNOSIS — R000 Tachycardia, unspecified: Secondary | ICD-10-CM | POA: Diagnosis not present

## 2019-10-03 DIAGNOSIS — I69365 Other paralytic syndrome following cerebral infarction, bilateral: Secondary | ICD-10-CM | POA: Diagnosis not present

## 2019-10-03 DIAGNOSIS — G8250 Quadriplegia, unspecified: Secondary | ICD-10-CM | POA: Diagnosis not present

## 2019-10-03 DIAGNOSIS — R339 Retention of urine, unspecified: Secondary | ICD-10-CM | POA: Diagnosis not present

## 2019-10-03 DIAGNOSIS — Z87440 Personal history of urinary (tract) infections: Secondary | ICD-10-CM | POA: Diagnosis not present

## 2019-10-03 DIAGNOSIS — Z6841 Body Mass Index (BMI) 40.0 and over, adult: Secondary | ICD-10-CM | POA: Diagnosis not present

## 2019-10-03 DIAGNOSIS — G40909 Epilepsy, unspecified, not intractable, without status epilepticus: Secondary | ICD-10-CM | POA: Diagnosis not present

## 2019-10-03 DIAGNOSIS — G4733 Obstructive sleep apnea (adult) (pediatric): Secondary | ICD-10-CM | POA: Diagnosis not present

## 2019-10-03 DIAGNOSIS — I10 Essential (primary) hypertension: Secondary | ICD-10-CM | POA: Diagnosis not present

## 2019-10-03 DIAGNOSIS — Z86718 Personal history of other venous thrombosis and embolism: Secondary | ICD-10-CM | POA: Diagnosis not present

## 2019-10-03 DIAGNOSIS — I69320 Aphasia following cerebral infarction: Secondary | ICD-10-CM | POA: Diagnosis not present

## 2019-10-03 DIAGNOSIS — E1165 Type 2 diabetes mellitus with hyperglycemia: Secondary | ICD-10-CM | POA: Diagnosis not present

## 2019-10-04 DIAGNOSIS — I69320 Aphasia following cerebral infarction: Secondary | ICD-10-CM | POA: Diagnosis not present

## 2019-10-04 DIAGNOSIS — Z6841 Body Mass Index (BMI) 40.0 and over, adult: Secondary | ICD-10-CM | POA: Diagnosis not present

## 2019-10-04 DIAGNOSIS — Z87440 Personal history of urinary (tract) infections: Secondary | ICD-10-CM | POA: Diagnosis not present

## 2019-10-04 DIAGNOSIS — G4733 Obstructive sleep apnea (adult) (pediatric): Secondary | ICD-10-CM | POA: Diagnosis not present

## 2019-10-04 DIAGNOSIS — I69365 Other paralytic syndrome following cerebral infarction, bilateral: Secondary | ICD-10-CM | POA: Diagnosis not present

## 2019-10-04 DIAGNOSIS — R339 Retention of urine, unspecified: Secondary | ICD-10-CM | POA: Diagnosis not present

## 2019-10-04 DIAGNOSIS — Z86718 Personal history of other venous thrombosis and embolism: Secondary | ICD-10-CM | POA: Diagnosis not present

## 2019-10-04 DIAGNOSIS — D649 Anemia, unspecified: Secondary | ICD-10-CM | POA: Diagnosis not present

## 2019-10-04 DIAGNOSIS — R000 Tachycardia, unspecified: Secondary | ICD-10-CM | POA: Diagnosis not present

## 2019-10-04 DIAGNOSIS — I10 Essential (primary) hypertension: Secondary | ICD-10-CM | POA: Diagnosis not present

## 2019-10-04 DIAGNOSIS — G8250 Quadriplegia, unspecified: Secondary | ICD-10-CM | POA: Diagnosis not present

## 2019-10-04 DIAGNOSIS — L89154 Pressure ulcer of sacral region, stage 4: Secondary | ICD-10-CM | POA: Diagnosis not present

## 2019-10-04 DIAGNOSIS — Z7401 Bed confinement status: Secondary | ICD-10-CM | POA: Diagnosis not present

## 2019-10-04 DIAGNOSIS — G40909 Epilepsy, unspecified, not intractable, without status epilepticus: Secondary | ICD-10-CM | POA: Diagnosis not present

## 2019-10-04 DIAGNOSIS — E1165 Type 2 diabetes mellitus with hyperglycemia: Secondary | ICD-10-CM | POA: Diagnosis not present

## 2019-10-07 DIAGNOSIS — I69365 Other paralytic syndrome following cerebral infarction, bilateral: Secondary | ICD-10-CM | POA: Diagnosis not present

## 2019-10-07 DIAGNOSIS — T8189XA Other complications of procedures, not elsewhere classified, initial encounter: Secondary | ICD-10-CM | POA: Diagnosis not present

## 2019-10-07 DIAGNOSIS — D649 Anemia, unspecified: Secondary | ICD-10-CM | POA: Diagnosis not present

## 2019-10-07 DIAGNOSIS — G4733 Obstructive sleep apnea (adult) (pediatric): Secondary | ICD-10-CM | POA: Diagnosis not present

## 2019-10-07 DIAGNOSIS — Z6841 Body Mass Index (BMI) 40.0 and over, adult: Secondary | ICD-10-CM | POA: Diagnosis not present

## 2019-10-07 DIAGNOSIS — R000 Tachycardia, unspecified: Secondary | ICD-10-CM | POA: Diagnosis not present

## 2019-10-07 DIAGNOSIS — E1165 Type 2 diabetes mellitus with hyperglycemia: Secondary | ICD-10-CM | POA: Diagnosis not present

## 2019-10-07 DIAGNOSIS — Z86718 Personal history of other venous thrombosis and embolism: Secondary | ICD-10-CM | POA: Diagnosis not present

## 2019-10-07 DIAGNOSIS — G40909 Epilepsy, unspecified, not intractable, without status epilepticus: Secondary | ICD-10-CM | POA: Diagnosis not present

## 2019-10-07 DIAGNOSIS — R339 Retention of urine, unspecified: Secondary | ICD-10-CM | POA: Diagnosis not present

## 2019-10-07 DIAGNOSIS — I69320 Aphasia following cerebral infarction: Secondary | ICD-10-CM | POA: Diagnosis not present

## 2019-10-07 DIAGNOSIS — L89154 Pressure ulcer of sacral region, stage 4: Secondary | ICD-10-CM | POA: Diagnosis not present

## 2019-10-07 DIAGNOSIS — I10 Essential (primary) hypertension: Secondary | ICD-10-CM | POA: Diagnosis not present

## 2019-10-07 DIAGNOSIS — Z7401 Bed confinement status: Secondary | ICD-10-CM | POA: Diagnosis not present

## 2019-10-07 DIAGNOSIS — Z87440 Personal history of urinary (tract) infections: Secondary | ICD-10-CM | POA: Diagnosis not present

## 2019-10-07 DIAGNOSIS — G8250 Quadriplegia, unspecified: Secondary | ICD-10-CM | POA: Diagnosis not present

## 2019-10-08 DIAGNOSIS — Z7401 Bed confinement status: Secondary | ICD-10-CM | POA: Diagnosis not present

## 2019-10-08 DIAGNOSIS — G4733 Obstructive sleep apnea (adult) (pediatric): Secondary | ICD-10-CM | POA: Diagnosis not present

## 2019-10-08 DIAGNOSIS — G8250 Quadriplegia, unspecified: Secondary | ICD-10-CM | POA: Diagnosis not present

## 2019-10-08 DIAGNOSIS — L89154 Pressure ulcer of sacral region, stage 4: Secondary | ICD-10-CM | POA: Diagnosis not present

## 2019-10-08 DIAGNOSIS — I69320 Aphasia following cerebral infarction: Secondary | ICD-10-CM | POA: Diagnosis not present

## 2019-10-08 DIAGNOSIS — Z87440 Personal history of urinary (tract) infections: Secondary | ICD-10-CM | POA: Diagnosis not present

## 2019-10-08 DIAGNOSIS — I69365 Other paralytic syndrome following cerebral infarction, bilateral: Secondary | ICD-10-CM | POA: Diagnosis not present

## 2019-10-08 DIAGNOSIS — R339 Retention of urine, unspecified: Secondary | ICD-10-CM | POA: Diagnosis not present

## 2019-10-08 DIAGNOSIS — Z86718 Personal history of other venous thrombosis and embolism: Secondary | ICD-10-CM | POA: Diagnosis not present

## 2019-10-08 DIAGNOSIS — Z6841 Body Mass Index (BMI) 40.0 and over, adult: Secondary | ICD-10-CM | POA: Diagnosis not present

## 2019-10-08 DIAGNOSIS — D649 Anemia, unspecified: Secondary | ICD-10-CM | POA: Diagnosis not present

## 2019-10-08 DIAGNOSIS — E1165 Type 2 diabetes mellitus with hyperglycemia: Secondary | ICD-10-CM | POA: Diagnosis not present

## 2019-10-08 DIAGNOSIS — R000 Tachycardia, unspecified: Secondary | ICD-10-CM | POA: Diagnosis not present

## 2019-10-08 DIAGNOSIS — I10 Essential (primary) hypertension: Secondary | ICD-10-CM | POA: Diagnosis not present

## 2019-10-08 DIAGNOSIS — G40909 Epilepsy, unspecified, not intractable, without status epilepticus: Secondary | ICD-10-CM | POA: Diagnosis not present

## 2019-10-09 DIAGNOSIS — I69365 Other paralytic syndrome following cerebral infarction, bilateral: Secondary | ICD-10-CM | POA: Diagnosis not present

## 2019-10-09 DIAGNOSIS — R000 Tachycardia, unspecified: Secondary | ICD-10-CM | POA: Diagnosis not present

## 2019-10-09 DIAGNOSIS — G4733 Obstructive sleep apnea (adult) (pediatric): Secondary | ICD-10-CM | POA: Diagnosis not present

## 2019-10-09 DIAGNOSIS — Z6841 Body Mass Index (BMI) 40.0 and over, adult: Secondary | ICD-10-CM | POA: Diagnosis not present

## 2019-10-09 DIAGNOSIS — G8250 Quadriplegia, unspecified: Secondary | ICD-10-CM | POA: Diagnosis not present

## 2019-10-09 DIAGNOSIS — I6389 Other cerebral infarction: Secondary | ICD-10-CM | POA: Diagnosis not present

## 2019-10-09 DIAGNOSIS — E1165 Type 2 diabetes mellitus with hyperglycemia: Secondary | ICD-10-CM | POA: Diagnosis not present

## 2019-10-09 DIAGNOSIS — Z87440 Personal history of urinary (tract) infections: Secondary | ICD-10-CM | POA: Diagnosis not present

## 2019-10-09 DIAGNOSIS — I10 Essential (primary) hypertension: Secondary | ICD-10-CM | POA: Diagnosis not present

## 2019-10-09 DIAGNOSIS — L89154 Pressure ulcer of sacral region, stage 4: Secondary | ICD-10-CM | POA: Diagnosis not present

## 2019-10-09 DIAGNOSIS — Z86718 Personal history of other venous thrombosis and embolism: Secondary | ICD-10-CM | POA: Diagnosis not present

## 2019-10-09 DIAGNOSIS — D649 Anemia, unspecified: Secondary | ICD-10-CM | POA: Diagnosis not present

## 2019-10-09 DIAGNOSIS — L8994 Pressure ulcer of unspecified site, stage 4: Secondary | ICD-10-CM | POA: Diagnosis not present

## 2019-10-09 DIAGNOSIS — R339 Retention of urine, unspecified: Secondary | ICD-10-CM | POA: Diagnosis not present

## 2019-10-09 DIAGNOSIS — G40909 Epilepsy, unspecified, not intractable, without status epilepticus: Secondary | ICD-10-CM | POA: Diagnosis not present

## 2019-10-09 DIAGNOSIS — Z7401 Bed confinement status: Secondary | ICD-10-CM | POA: Diagnosis not present

## 2019-10-09 DIAGNOSIS — I69320 Aphasia following cerebral infarction: Secondary | ICD-10-CM | POA: Diagnosis not present

## 2019-10-10 DIAGNOSIS — G40909 Epilepsy, unspecified, not intractable, without status epilepticus: Secondary | ICD-10-CM | POA: Diagnosis not present

## 2019-10-10 DIAGNOSIS — L89154 Pressure ulcer of sacral region, stage 4: Secondary | ICD-10-CM | POA: Diagnosis not present

## 2019-10-10 DIAGNOSIS — Z6841 Body Mass Index (BMI) 40.0 and over, adult: Secondary | ICD-10-CM | POA: Diagnosis not present

## 2019-10-10 DIAGNOSIS — G4733 Obstructive sleep apnea (adult) (pediatric): Secondary | ICD-10-CM | POA: Diagnosis not present

## 2019-10-10 DIAGNOSIS — R339 Retention of urine, unspecified: Secondary | ICD-10-CM | POA: Diagnosis not present

## 2019-10-10 DIAGNOSIS — I10 Essential (primary) hypertension: Secondary | ICD-10-CM | POA: Diagnosis not present

## 2019-10-10 DIAGNOSIS — Z7401 Bed confinement status: Secondary | ICD-10-CM | POA: Diagnosis not present

## 2019-10-10 DIAGNOSIS — I69320 Aphasia following cerebral infarction: Secondary | ICD-10-CM | POA: Diagnosis not present

## 2019-10-10 DIAGNOSIS — I69365 Other paralytic syndrome following cerebral infarction, bilateral: Secondary | ICD-10-CM | POA: Diagnosis not present

## 2019-10-10 DIAGNOSIS — Z86718 Personal history of other venous thrombosis and embolism: Secondary | ICD-10-CM | POA: Diagnosis not present

## 2019-10-10 DIAGNOSIS — Z87440 Personal history of urinary (tract) infections: Secondary | ICD-10-CM | POA: Diagnosis not present

## 2019-10-10 DIAGNOSIS — R000 Tachycardia, unspecified: Secondary | ICD-10-CM | POA: Diagnosis not present

## 2019-10-10 DIAGNOSIS — D649 Anemia, unspecified: Secondary | ICD-10-CM | POA: Diagnosis not present

## 2019-10-10 DIAGNOSIS — G8250 Quadriplegia, unspecified: Secondary | ICD-10-CM | POA: Diagnosis not present

## 2019-10-10 DIAGNOSIS — E1165 Type 2 diabetes mellitus with hyperglycemia: Secondary | ICD-10-CM | POA: Diagnosis not present

## 2019-10-11 DIAGNOSIS — Z930 Tracheostomy status: Secondary | ICD-10-CM | POA: Diagnosis not present

## 2019-10-11 DIAGNOSIS — I10 Essential (primary) hypertension: Secondary | ICD-10-CM | POA: Diagnosis not present

## 2019-10-11 DIAGNOSIS — Z7401 Bed confinement status: Secondary | ICD-10-CM | POA: Diagnosis not present

## 2019-10-11 DIAGNOSIS — G4733 Obstructive sleep apnea (adult) (pediatric): Secondary | ICD-10-CM | POA: Diagnosis not present

## 2019-10-11 DIAGNOSIS — Z87440 Personal history of urinary (tract) infections: Secondary | ICD-10-CM | POA: Diagnosis not present

## 2019-10-11 DIAGNOSIS — L89154 Pressure ulcer of sacral region, stage 4: Secondary | ICD-10-CM | POA: Diagnosis not present

## 2019-10-11 DIAGNOSIS — E1165 Type 2 diabetes mellitus with hyperglycemia: Secondary | ICD-10-CM | POA: Diagnosis not present

## 2019-10-11 DIAGNOSIS — R000 Tachycardia, unspecified: Secondary | ICD-10-CM | POA: Diagnosis not present

## 2019-10-11 DIAGNOSIS — G40909 Epilepsy, unspecified, not intractable, without status epilepticus: Secondary | ICD-10-CM | POA: Diagnosis not present

## 2019-10-11 DIAGNOSIS — Z6841 Body Mass Index (BMI) 40.0 and over, adult: Secondary | ICD-10-CM | POA: Diagnosis not present

## 2019-10-11 DIAGNOSIS — R339 Retention of urine, unspecified: Secondary | ICD-10-CM | POA: Diagnosis not present

## 2019-10-11 DIAGNOSIS — I69365 Other paralytic syndrome following cerebral infarction, bilateral: Secondary | ICD-10-CM | POA: Diagnosis not present

## 2019-10-11 DIAGNOSIS — G8250 Quadriplegia, unspecified: Secondary | ICD-10-CM | POA: Diagnosis not present

## 2019-10-11 DIAGNOSIS — Z86718 Personal history of other venous thrombosis and embolism: Secondary | ICD-10-CM | POA: Diagnosis not present

## 2019-10-11 DIAGNOSIS — I69320 Aphasia following cerebral infarction: Secondary | ICD-10-CM | POA: Diagnosis not present

## 2019-10-11 DIAGNOSIS — D649 Anemia, unspecified: Secondary | ICD-10-CM | POA: Diagnosis not present

## 2019-10-14 DIAGNOSIS — Z86718 Personal history of other venous thrombosis and embolism: Secondary | ICD-10-CM | POA: Diagnosis not present

## 2019-10-14 DIAGNOSIS — L89154 Pressure ulcer of sacral region, stage 4: Secondary | ICD-10-CM | POA: Diagnosis not present

## 2019-10-14 DIAGNOSIS — G40909 Epilepsy, unspecified, not intractable, without status epilepticus: Secondary | ICD-10-CM | POA: Diagnosis not present

## 2019-10-14 DIAGNOSIS — E1165 Type 2 diabetes mellitus with hyperglycemia: Secondary | ICD-10-CM | POA: Diagnosis not present

## 2019-10-14 DIAGNOSIS — I10 Essential (primary) hypertension: Secondary | ICD-10-CM | POA: Diagnosis not present

## 2019-10-14 DIAGNOSIS — G8250 Quadriplegia, unspecified: Secondary | ICD-10-CM | POA: Diagnosis not present

## 2019-10-14 DIAGNOSIS — D649 Anemia, unspecified: Secondary | ICD-10-CM | POA: Diagnosis not present

## 2019-10-14 DIAGNOSIS — Z87440 Personal history of urinary (tract) infections: Secondary | ICD-10-CM | POA: Diagnosis not present

## 2019-10-14 DIAGNOSIS — G4733 Obstructive sleep apnea (adult) (pediatric): Secondary | ICD-10-CM | POA: Diagnosis not present

## 2019-10-14 DIAGNOSIS — I69365 Other paralytic syndrome following cerebral infarction, bilateral: Secondary | ICD-10-CM | POA: Diagnosis not present

## 2019-10-14 DIAGNOSIS — Z6841 Body Mass Index (BMI) 40.0 and over, adult: Secondary | ICD-10-CM | POA: Diagnosis not present

## 2019-10-14 DIAGNOSIS — I69320 Aphasia following cerebral infarction: Secondary | ICD-10-CM | POA: Diagnosis not present

## 2019-10-14 DIAGNOSIS — R000 Tachycardia, unspecified: Secondary | ICD-10-CM | POA: Diagnosis not present

## 2019-10-14 DIAGNOSIS — Z7401 Bed confinement status: Secondary | ICD-10-CM | POA: Diagnosis not present

## 2019-10-14 DIAGNOSIS — R339 Retention of urine, unspecified: Secondary | ICD-10-CM | POA: Diagnosis not present

## 2019-10-15 DIAGNOSIS — G8250 Quadriplegia, unspecified: Secondary | ICD-10-CM | POA: Diagnosis not present

## 2019-10-15 DIAGNOSIS — L89154 Pressure ulcer of sacral region, stage 4: Secondary | ICD-10-CM | POA: Diagnosis not present

## 2019-10-15 DIAGNOSIS — R339 Retention of urine, unspecified: Secondary | ICD-10-CM | POA: Diagnosis not present

## 2019-10-15 DIAGNOSIS — I69320 Aphasia following cerebral infarction: Secondary | ICD-10-CM | POA: Diagnosis not present

## 2019-10-15 DIAGNOSIS — I10 Essential (primary) hypertension: Secondary | ICD-10-CM | POA: Diagnosis not present

## 2019-10-15 DIAGNOSIS — Z6841 Body Mass Index (BMI) 40.0 and over, adult: Secondary | ICD-10-CM | POA: Diagnosis not present

## 2019-10-15 DIAGNOSIS — G40909 Epilepsy, unspecified, not intractable, without status epilepticus: Secondary | ICD-10-CM | POA: Diagnosis not present

## 2019-10-15 DIAGNOSIS — Z930 Tracheostomy status: Secondary | ICD-10-CM | POA: Diagnosis not present

## 2019-10-15 DIAGNOSIS — Z86718 Personal history of other venous thrombosis and embolism: Secondary | ICD-10-CM | POA: Diagnosis not present

## 2019-10-15 DIAGNOSIS — R000 Tachycardia, unspecified: Secondary | ICD-10-CM | POA: Diagnosis not present

## 2019-10-15 DIAGNOSIS — D649 Anemia, unspecified: Secondary | ICD-10-CM | POA: Diagnosis not present

## 2019-10-15 DIAGNOSIS — Z7401 Bed confinement status: Secondary | ICD-10-CM | POA: Diagnosis not present

## 2019-10-15 DIAGNOSIS — G4733 Obstructive sleep apnea (adult) (pediatric): Secondary | ICD-10-CM | POA: Diagnosis not present

## 2019-10-15 DIAGNOSIS — Z87440 Personal history of urinary (tract) infections: Secondary | ICD-10-CM | POA: Diagnosis not present

## 2019-10-15 DIAGNOSIS — I69365 Other paralytic syndrome following cerebral infarction, bilateral: Secondary | ICD-10-CM | POA: Diagnosis not present

## 2019-10-15 DIAGNOSIS — E1165 Type 2 diabetes mellitus with hyperglycemia: Secondary | ICD-10-CM | POA: Diagnosis not present

## 2019-10-16 DIAGNOSIS — I69365 Other paralytic syndrome following cerebral infarction, bilateral: Secondary | ICD-10-CM | POA: Diagnosis not present

## 2019-10-16 DIAGNOSIS — I69320 Aphasia following cerebral infarction: Secondary | ICD-10-CM | POA: Diagnosis not present

## 2019-10-16 DIAGNOSIS — R000 Tachycardia, unspecified: Secondary | ICD-10-CM | POA: Diagnosis not present

## 2019-10-16 DIAGNOSIS — L89154 Pressure ulcer of sacral region, stage 4: Secondary | ICD-10-CM | POA: Diagnosis not present

## 2019-10-16 DIAGNOSIS — D649 Anemia, unspecified: Secondary | ICD-10-CM | POA: Diagnosis not present

## 2019-10-16 DIAGNOSIS — Z86718 Personal history of other venous thrombosis and embolism: Secondary | ICD-10-CM | POA: Diagnosis not present

## 2019-10-16 DIAGNOSIS — Z7401 Bed confinement status: Secondary | ICD-10-CM | POA: Diagnosis not present

## 2019-10-16 DIAGNOSIS — Z6841 Body Mass Index (BMI) 40.0 and over, adult: Secondary | ICD-10-CM | POA: Diagnosis not present

## 2019-10-16 DIAGNOSIS — R339 Retention of urine, unspecified: Secondary | ICD-10-CM | POA: Diagnosis not present

## 2019-10-16 DIAGNOSIS — Z87440 Personal history of urinary (tract) infections: Secondary | ICD-10-CM | POA: Diagnosis not present

## 2019-10-16 DIAGNOSIS — I10 Essential (primary) hypertension: Secondary | ICD-10-CM | POA: Diagnosis not present

## 2019-10-16 DIAGNOSIS — E1165 Type 2 diabetes mellitus with hyperglycemia: Secondary | ICD-10-CM | POA: Diagnosis not present

## 2019-10-16 DIAGNOSIS — G4733 Obstructive sleep apnea (adult) (pediatric): Secondary | ICD-10-CM | POA: Diagnosis not present

## 2019-10-16 DIAGNOSIS — G8250 Quadriplegia, unspecified: Secondary | ICD-10-CM | POA: Diagnosis not present

## 2019-10-16 DIAGNOSIS — G40909 Epilepsy, unspecified, not intractable, without status epilepticus: Secondary | ICD-10-CM | POA: Diagnosis not present

## 2019-10-17 DIAGNOSIS — G8250 Quadriplegia, unspecified: Secondary | ICD-10-CM | POA: Diagnosis not present

## 2019-10-17 DIAGNOSIS — I69365 Other paralytic syndrome following cerebral infarction, bilateral: Secondary | ICD-10-CM | POA: Diagnosis not present

## 2019-10-17 DIAGNOSIS — R339 Retention of urine, unspecified: Secondary | ICD-10-CM | POA: Diagnosis not present

## 2019-10-17 DIAGNOSIS — L89154 Pressure ulcer of sacral region, stage 4: Secondary | ICD-10-CM | POA: Diagnosis not present

## 2019-10-17 DIAGNOSIS — Z7401 Bed confinement status: Secondary | ICD-10-CM | POA: Diagnosis not present

## 2019-10-17 DIAGNOSIS — G4733 Obstructive sleep apnea (adult) (pediatric): Secondary | ICD-10-CM | POA: Diagnosis not present

## 2019-10-17 DIAGNOSIS — Z86718 Personal history of other venous thrombosis and embolism: Secondary | ICD-10-CM | POA: Diagnosis not present

## 2019-10-17 DIAGNOSIS — I10 Essential (primary) hypertension: Secondary | ICD-10-CM | POA: Diagnosis not present

## 2019-10-17 DIAGNOSIS — G40909 Epilepsy, unspecified, not intractable, without status epilepticus: Secondary | ICD-10-CM | POA: Diagnosis not present

## 2019-10-17 DIAGNOSIS — I69320 Aphasia following cerebral infarction: Secondary | ICD-10-CM | POA: Diagnosis not present

## 2019-10-17 DIAGNOSIS — R000 Tachycardia, unspecified: Secondary | ICD-10-CM | POA: Diagnosis not present

## 2019-10-17 DIAGNOSIS — D649 Anemia, unspecified: Secondary | ICD-10-CM | POA: Diagnosis not present

## 2019-10-17 DIAGNOSIS — E1165 Type 2 diabetes mellitus with hyperglycemia: Secondary | ICD-10-CM | POA: Diagnosis not present

## 2019-10-17 DIAGNOSIS — Z6841 Body Mass Index (BMI) 40.0 and over, adult: Secondary | ICD-10-CM | POA: Diagnosis not present

## 2019-10-17 DIAGNOSIS — Z87440 Personal history of urinary (tract) infections: Secondary | ICD-10-CM | POA: Diagnosis not present

## 2019-10-18 DIAGNOSIS — I6389 Other cerebral infarction: Secondary | ICD-10-CM | POA: Diagnosis not present

## 2019-10-18 DIAGNOSIS — L8994 Pressure ulcer of unspecified site, stage 4: Secondary | ICD-10-CM | POA: Diagnosis not present

## 2019-10-18 DIAGNOSIS — G4733 Obstructive sleep apnea (adult) (pediatric): Secondary | ICD-10-CM | POA: Diagnosis not present

## 2019-10-21 DIAGNOSIS — Z86718 Personal history of other venous thrombosis and embolism: Secondary | ICD-10-CM | POA: Diagnosis not present

## 2019-10-21 DIAGNOSIS — D649 Anemia, unspecified: Secondary | ICD-10-CM | POA: Diagnosis not present

## 2019-10-21 DIAGNOSIS — G8250 Quadriplegia, unspecified: Secondary | ICD-10-CM | POA: Diagnosis not present

## 2019-10-21 DIAGNOSIS — Z87440 Personal history of urinary (tract) infections: Secondary | ICD-10-CM | POA: Diagnosis not present

## 2019-10-21 DIAGNOSIS — G4733 Obstructive sleep apnea (adult) (pediatric): Secondary | ICD-10-CM | POA: Diagnosis not present

## 2019-10-21 DIAGNOSIS — Z6841 Body Mass Index (BMI) 40.0 and over, adult: Secondary | ICD-10-CM | POA: Diagnosis not present

## 2019-10-21 DIAGNOSIS — I69365 Other paralytic syndrome following cerebral infarction, bilateral: Secondary | ICD-10-CM | POA: Diagnosis not present

## 2019-10-21 DIAGNOSIS — I69320 Aphasia following cerebral infarction: Secondary | ICD-10-CM | POA: Diagnosis not present

## 2019-10-21 DIAGNOSIS — L89154 Pressure ulcer of sacral region, stage 4: Secondary | ICD-10-CM | POA: Diagnosis not present

## 2019-10-21 DIAGNOSIS — Z7401 Bed confinement status: Secondary | ICD-10-CM | POA: Diagnosis not present

## 2019-10-21 DIAGNOSIS — I10 Essential (primary) hypertension: Secondary | ICD-10-CM | POA: Diagnosis not present

## 2019-10-21 DIAGNOSIS — R339 Retention of urine, unspecified: Secondary | ICD-10-CM | POA: Diagnosis not present

## 2019-10-21 DIAGNOSIS — G40909 Epilepsy, unspecified, not intractable, without status epilepticus: Secondary | ICD-10-CM | POA: Diagnosis not present

## 2019-10-21 DIAGNOSIS — E1165 Type 2 diabetes mellitus with hyperglycemia: Secondary | ICD-10-CM | POA: Diagnosis not present

## 2019-10-21 DIAGNOSIS — R000 Tachycardia, unspecified: Secondary | ICD-10-CM | POA: Diagnosis not present

## 2019-10-22 DIAGNOSIS — D649 Anemia, unspecified: Secondary | ICD-10-CM | POA: Diagnosis not present

## 2019-10-22 DIAGNOSIS — G8250 Quadriplegia, unspecified: Secondary | ICD-10-CM | POA: Diagnosis not present

## 2019-10-22 DIAGNOSIS — G4733 Obstructive sleep apnea (adult) (pediatric): Secondary | ICD-10-CM | POA: Diagnosis not present

## 2019-10-22 DIAGNOSIS — Z6841 Body Mass Index (BMI) 40.0 and over, adult: Secondary | ICD-10-CM | POA: Diagnosis not present

## 2019-10-22 DIAGNOSIS — I69320 Aphasia following cerebral infarction: Secondary | ICD-10-CM | POA: Diagnosis not present

## 2019-10-22 DIAGNOSIS — R339 Retention of urine, unspecified: Secondary | ICD-10-CM | POA: Diagnosis not present

## 2019-10-22 DIAGNOSIS — L89154 Pressure ulcer of sacral region, stage 4: Secondary | ICD-10-CM | POA: Diagnosis not present

## 2019-10-22 DIAGNOSIS — E1165 Type 2 diabetes mellitus with hyperglycemia: Secondary | ICD-10-CM | POA: Diagnosis not present

## 2019-10-22 DIAGNOSIS — I10 Essential (primary) hypertension: Secondary | ICD-10-CM | POA: Diagnosis not present

## 2019-10-22 DIAGNOSIS — G40909 Epilepsy, unspecified, not intractable, without status epilepticus: Secondary | ICD-10-CM | POA: Diagnosis not present

## 2019-10-22 DIAGNOSIS — I69365 Other paralytic syndrome following cerebral infarction, bilateral: Secondary | ICD-10-CM | POA: Diagnosis not present

## 2019-10-22 DIAGNOSIS — Z7401 Bed confinement status: Secondary | ICD-10-CM | POA: Diagnosis not present

## 2019-10-22 DIAGNOSIS — R000 Tachycardia, unspecified: Secondary | ICD-10-CM | POA: Diagnosis not present

## 2019-10-22 DIAGNOSIS — Z86718 Personal history of other venous thrombosis and embolism: Secondary | ICD-10-CM | POA: Diagnosis not present

## 2019-10-22 DIAGNOSIS — Z87440 Personal history of urinary (tract) infections: Secondary | ICD-10-CM | POA: Diagnosis not present

## 2019-10-23 DIAGNOSIS — D649 Anemia, unspecified: Secondary | ICD-10-CM | POA: Diagnosis not present

## 2019-10-23 DIAGNOSIS — I6389 Other cerebral infarction: Secondary | ICD-10-CM | POA: Diagnosis not present

## 2019-10-23 DIAGNOSIS — R000 Tachycardia, unspecified: Secondary | ICD-10-CM | POA: Diagnosis not present

## 2019-10-23 DIAGNOSIS — R339 Retention of urine, unspecified: Secondary | ICD-10-CM | POA: Diagnosis not present

## 2019-10-23 DIAGNOSIS — G8250 Quadriplegia, unspecified: Secondary | ICD-10-CM | POA: Diagnosis not present

## 2019-10-23 DIAGNOSIS — Z87440 Personal history of urinary (tract) infections: Secondary | ICD-10-CM | POA: Diagnosis not present

## 2019-10-23 DIAGNOSIS — Z6841 Body Mass Index (BMI) 40.0 and over, adult: Secondary | ICD-10-CM | POA: Diagnosis not present

## 2019-10-23 DIAGNOSIS — E1165 Type 2 diabetes mellitus with hyperglycemia: Secondary | ICD-10-CM | POA: Diagnosis not present

## 2019-10-23 DIAGNOSIS — I10 Essential (primary) hypertension: Secondary | ICD-10-CM | POA: Diagnosis not present

## 2019-10-23 DIAGNOSIS — L89154 Pressure ulcer of sacral region, stage 4: Secondary | ICD-10-CM | POA: Diagnosis not present

## 2019-10-23 DIAGNOSIS — R32 Unspecified urinary incontinence: Secondary | ICD-10-CM | POA: Diagnosis not present

## 2019-10-23 DIAGNOSIS — G40909 Epilepsy, unspecified, not intractable, without status epilepticus: Secondary | ICD-10-CM | POA: Diagnosis not present

## 2019-10-23 DIAGNOSIS — I69365 Other paralytic syndrome following cerebral infarction, bilateral: Secondary | ICD-10-CM | POA: Diagnosis not present

## 2019-10-23 DIAGNOSIS — Z7401 Bed confinement status: Secondary | ICD-10-CM | POA: Diagnosis not present

## 2019-10-23 DIAGNOSIS — G4733 Obstructive sleep apnea (adult) (pediatric): Secondary | ICD-10-CM | POA: Diagnosis not present

## 2019-10-23 DIAGNOSIS — I69320 Aphasia following cerebral infarction: Secondary | ICD-10-CM | POA: Diagnosis not present

## 2019-10-23 DIAGNOSIS — Z86718 Personal history of other venous thrombosis and embolism: Secondary | ICD-10-CM | POA: Diagnosis not present

## 2019-10-23 DIAGNOSIS — L8994 Pressure ulcer of unspecified site, stage 4: Secondary | ICD-10-CM | POA: Diagnosis not present

## 2019-10-24 DIAGNOSIS — G8250 Quadriplegia, unspecified: Secondary | ICD-10-CM | POA: Diagnosis not present

## 2019-10-24 DIAGNOSIS — G4733 Obstructive sleep apnea (adult) (pediatric): Secondary | ICD-10-CM | POA: Diagnosis not present

## 2019-10-24 DIAGNOSIS — L89154 Pressure ulcer of sacral region, stage 4: Secondary | ICD-10-CM | POA: Diagnosis not present

## 2019-10-24 DIAGNOSIS — E1165 Type 2 diabetes mellitus with hyperglycemia: Secondary | ICD-10-CM | POA: Diagnosis not present

## 2019-10-24 DIAGNOSIS — Z87440 Personal history of urinary (tract) infections: Secondary | ICD-10-CM | POA: Diagnosis not present

## 2019-10-24 DIAGNOSIS — I10 Essential (primary) hypertension: Secondary | ICD-10-CM | POA: Diagnosis not present

## 2019-10-24 DIAGNOSIS — I69320 Aphasia following cerebral infarction: Secondary | ICD-10-CM | POA: Diagnosis not present

## 2019-10-24 DIAGNOSIS — Z6841 Body Mass Index (BMI) 40.0 and over, adult: Secondary | ICD-10-CM | POA: Diagnosis not present

## 2019-10-24 DIAGNOSIS — G40909 Epilepsy, unspecified, not intractable, without status epilepticus: Secondary | ICD-10-CM | POA: Diagnosis not present

## 2019-10-24 DIAGNOSIS — Z7401 Bed confinement status: Secondary | ICD-10-CM | POA: Diagnosis not present

## 2019-10-24 DIAGNOSIS — R000 Tachycardia, unspecified: Secondary | ICD-10-CM | POA: Diagnosis not present

## 2019-10-24 DIAGNOSIS — Z86718 Personal history of other venous thrombosis and embolism: Secondary | ICD-10-CM | POA: Diagnosis not present

## 2019-10-24 DIAGNOSIS — I69365 Other paralytic syndrome following cerebral infarction, bilateral: Secondary | ICD-10-CM | POA: Diagnosis not present

## 2019-10-24 DIAGNOSIS — R339 Retention of urine, unspecified: Secondary | ICD-10-CM | POA: Diagnosis not present

## 2019-10-24 DIAGNOSIS — D649 Anemia, unspecified: Secondary | ICD-10-CM | POA: Diagnosis not present

## 2019-10-28 DIAGNOSIS — Z6841 Body Mass Index (BMI) 40.0 and over, adult: Secondary | ICD-10-CM | POA: Diagnosis not present

## 2019-10-28 DIAGNOSIS — Z7401 Bed confinement status: Secondary | ICD-10-CM | POA: Diagnosis not present

## 2019-10-28 DIAGNOSIS — R339 Retention of urine, unspecified: Secondary | ICD-10-CM | POA: Diagnosis not present

## 2019-10-28 DIAGNOSIS — G8250 Quadriplegia, unspecified: Secondary | ICD-10-CM | POA: Diagnosis not present

## 2019-10-28 DIAGNOSIS — G4733 Obstructive sleep apnea (adult) (pediatric): Secondary | ICD-10-CM | POA: Diagnosis not present

## 2019-10-28 DIAGNOSIS — I69365 Other paralytic syndrome following cerebral infarction, bilateral: Secondary | ICD-10-CM | POA: Diagnosis not present

## 2019-10-28 DIAGNOSIS — Z86718 Personal history of other venous thrombosis and embolism: Secondary | ICD-10-CM | POA: Diagnosis not present

## 2019-10-28 DIAGNOSIS — I69320 Aphasia following cerebral infarction: Secondary | ICD-10-CM | POA: Diagnosis not present

## 2019-10-28 DIAGNOSIS — I10 Essential (primary) hypertension: Secondary | ICD-10-CM | POA: Diagnosis not present

## 2019-10-28 DIAGNOSIS — E1165 Type 2 diabetes mellitus with hyperglycemia: Secondary | ICD-10-CM | POA: Diagnosis not present

## 2019-10-28 DIAGNOSIS — Z87440 Personal history of urinary (tract) infections: Secondary | ICD-10-CM | POA: Diagnosis not present

## 2019-10-28 DIAGNOSIS — R000 Tachycardia, unspecified: Secondary | ICD-10-CM | POA: Diagnosis not present

## 2019-10-28 DIAGNOSIS — D649 Anemia, unspecified: Secondary | ICD-10-CM | POA: Diagnosis not present

## 2019-10-28 DIAGNOSIS — L89154 Pressure ulcer of sacral region, stage 4: Secondary | ICD-10-CM | POA: Diagnosis not present

## 2019-10-28 DIAGNOSIS — G40909 Epilepsy, unspecified, not intractable, without status epilepticus: Secondary | ICD-10-CM | POA: Diagnosis not present

## 2019-10-29 DIAGNOSIS — R000 Tachycardia, unspecified: Secondary | ICD-10-CM | POA: Diagnosis not present

## 2019-10-29 DIAGNOSIS — E1165 Type 2 diabetes mellitus with hyperglycemia: Secondary | ICD-10-CM | POA: Diagnosis not present

## 2019-10-29 DIAGNOSIS — Z6841 Body Mass Index (BMI) 40.0 and over, adult: Secondary | ICD-10-CM | POA: Diagnosis not present

## 2019-10-29 DIAGNOSIS — I69320 Aphasia following cerebral infarction: Secondary | ICD-10-CM | POA: Diagnosis not present

## 2019-10-29 DIAGNOSIS — G8250 Quadriplegia, unspecified: Secondary | ICD-10-CM | POA: Diagnosis not present

## 2019-10-29 DIAGNOSIS — Z87440 Personal history of urinary (tract) infections: Secondary | ICD-10-CM | POA: Diagnosis not present

## 2019-10-29 DIAGNOSIS — D649 Anemia, unspecified: Secondary | ICD-10-CM | POA: Diagnosis not present

## 2019-10-29 DIAGNOSIS — R339 Retention of urine, unspecified: Secondary | ICD-10-CM | POA: Diagnosis not present

## 2019-10-29 DIAGNOSIS — G40909 Epilepsy, unspecified, not intractable, without status epilepticus: Secondary | ICD-10-CM | POA: Diagnosis not present

## 2019-10-29 DIAGNOSIS — I69365 Other paralytic syndrome following cerebral infarction, bilateral: Secondary | ICD-10-CM | POA: Diagnosis not present

## 2019-10-29 DIAGNOSIS — I10 Essential (primary) hypertension: Secondary | ICD-10-CM | POA: Diagnosis not present

## 2019-10-29 DIAGNOSIS — Z7401 Bed confinement status: Secondary | ICD-10-CM | POA: Diagnosis not present

## 2019-10-29 DIAGNOSIS — Z86718 Personal history of other venous thrombosis and embolism: Secondary | ICD-10-CM | POA: Diagnosis not present

## 2019-10-29 DIAGNOSIS — T8189XA Other complications of procedures, not elsewhere classified, initial encounter: Secondary | ICD-10-CM | POA: Diagnosis not present

## 2019-10-29 DIAGNOSIS — G4733 Obstructive sleep apnea (adult) (pediatric): Secondary | ICD-10-CM | POA: Diagnosis not present

## 2019-10-29 DIAGNOSIS — L89154 Pressure ulcer of sacral region, stage 4: Secondary | ICD-10-CM | POA: Diagnosis not present

## 2019-10-30 DIAGNOSIS — I69365 Other paralytic syndrome following cerebral infarction, bilateral: Secondary | ICD-10-CM | POA: Diagnosis not present

## 2019-10-30 DIAGNOSIS — L89154 Pressure ulcer of sacral region, stage 4: Secondary | ICD-10-CM | POA: Diagnosis not present

## 2019-10-30 DIAGNOSIS — G40909 Epilepsy, unspecified, not intractable, without status epilepticus: Secondary | ICD-10-CM | POA: Diagnosis not present

## 2019-10-30 DIAGNOSIS — I10 Essential (primary) hypertension: Secondary | ICD-10-CM | POA: Diagnosis not present

## 2019-10-30 DIAGNOSIS — Z7401 Bed confinement status: Secondary | ICD-10-CM | POA: Diagnosis not present

## 2019-10-30 DIAGNOSIS — Z6841 Body Mass Index (BMI) 40.0 and over, adult: Secondary | ICD-10-CM | POA: Diagnosis not present

## 2019-10-30 DIAGNOSIS — R339 Retention of urine, unspecified: Secondary | ICD-10-CM | POA: Diagnosis not present

## 2019-10-30 DIAGNOSIS — R000 Tachycardia, unspecified: Secondary | ICD-10-CM | POA: Diagnosis not present

## 2019-10-30 DIAGNOSIS — E1165 Type 2 diabetes mellitus with hyperglycemia: Secondary | ICD-10-CM | POA: Diagnosis not present

## 2019-10-30 DIAGNOSIS — Z86718 Personal history of other venous thrombosis and embolism: Secondary | ICD-10-CM | POA: Diagnosis not present

## 2019-10-30 DIAGNOSIS — Z87440 Personal history of urinary (tract) infections: Secondary | ICD-10-CM | POA: Diagnosis not present

## 2019-10-30 DIAGNOSIS — D649 Anemia, unspecified: Secondary | ICD-10-CM | POA: Diagnosis not present

## 2019-10-30 DIAGNOSIS — I69320 Aphasia following cerebral infarction: Secondary | ICD-10-CM | POA: Diagnosis not present

## 2019-10-30 DIAGNOSIS — G8250 Quadriplegia, unspecified: Secondary | ICD-10-CM | POA: Diagnosis not present

## 2019-10-30 DIAGNOSIS — G4733 Obstructive sleep apnea (adult) (pediatric): Secondary | ICD-10-CM | POA: Diagnosis not present

## 2019-11-01 DIAGNOSIS — L89154 Pressure ulcer of sacral region, stage 4: Secondary | ICD-10-CM | POA: Diagnosis not present

## 2019-11-01 DIAGNOSIS — E1165 Type 2 diabetes mellitus with hyperglycemia: Secondary | ICD-10-CM | POA: Diagnosis not present

## 2019-11-01 DIAGNOSIS — Z7401 Bed confinement status: Secondary | ICD-10-CM | POA: Diagnosis not present

## 2019-11-01 DIAGNOSIS — Z86718 Personal history of other venous thrombosis and embolism: Secondary | ICD-10-CM | POA: Diagnosis not present

## 2019-11-01 DIAGNOSIS — G4733 Obstructive sleep apnea (adult) (pediatric): Secondary | ICD-10-CM | POA: Diagnosis not present

## 2019-11-01 DIAGNOSIS — Z87440 Personal history of urinary (tract) infections: Secondary | ICD-10-CM | POA: Diagnosis not present

## 2019-11-01 DIAGNOSIS — I10 Essential (primary) hypertension: Secondary | ICD-10-CM | POA: Diagnosis not present

## 2019-11-01 DIAGNOSIS — I69365 Other paralytic syndrome following cerebral infarction, bilateral: Secondary | ICD-10-CM | POA: Diagnosis not present

## 2019-11-01 DIAGNOSIS — G40909 Epilepsy, unspecified, not intractable, without status epilepticus: Secondary | ICD-10-CM | POA: Diagnosis not present

## 2019-11-01 DIAGNOSIS — D649 Anemia, unspecified: Secondary | ICD-10-CM | POA: Diagnosis not present

## 2019-11-01 DIAGNOSIS — Z6841 Body Mass Index (BMI) 40.0 and over, adult: Secondary | ICD-10-CM | POA: Diagnosis not present

## 2019-11-01 DIAGNOSIS — G8250 Quadriplegia, unspecified: Secondary | ICD-10-CM | POA: Diagnosis not present

## 2019-11-01 DIAGNOSIS — R000 Tachycardia, unspecified: Secondary | ICD-10-CM | POA: Diagnosis not present

## 2019-11-01 DIAGNOSIS — I69320 Aphasia following cerebral infarction: Secondary | ICD-10-CM | POA: Diagnosis not present

## 2019-11-01 DIAGNOSIS — R339 Retention of urine, unspecified: Secondary | ICD-10-CM | POA: Diagnosis not present

## 2019-11-05 DIAGNOSIS — Z9181 History of falling: Secondary | ICD-10-CM | POA: Diagnosis not present

## 2019-11-05 DIAGNOSIS — D649 Anemia, unspecified: Secondary | ICD-10-CM | POA: Diagnosis not present

## 2019-11-05 DIAGNOSIS — G4733 Obstructive sleep apnea (adult) (pediatric): Secondary | ICD-10-CM | POA: Diagnosis not present

## 2019-11-05 DIAGNOSIS — G8250 Quadriplegia, unspecified: Secondary | ICD-10-CM | POA: Diagnosis not present

## 2019-11-05 DIAGNOSIS — Z6841 Body Mass Index (BMI) 40.0 and over, adult: Secondary | ICD-10-CM | POA: Diagnosis not present

## 2019-11-05 DIAGNOSIS — R000 Tachycardia, unspecified: Secondary | ICD-10-CM | POA: Diagnosis not present

## 2019-11-05 DIAGNOSIS — Z7401 Bed confinement status: Secondary | ICD-10-CM | POA: Diagnosis not present

## 2019-11-05 DIAGNOSIS — R339 Retention of urine, unspecified: Secondary | ICD-10-CM | POA: Diagnosis not present

## 2019-11-05 DIAGNOSIS — Z86718 Personal history of other venous thrombosis and embolism: Secondary | ICD-10-CM | POA: Diagnosis not present

## 2019-11-05 DIAGNOSIS — I10 Essential (primary) hypertension: Secondary | ICD-10-CM | POA: Diagnosis not present

## 2019-11-05 DIAGNOSIS — Z87440 Personal history of urinary (tract) infections: Secondary | ICD-10-CM | POA: Diagnosis not present

## 2019-11-05 DIAGNOSIS — E1165 Type 2 diabetes mellitus with hyperglycemia: Secondary | ICD-10-CM | POA: Diagnosis not present

## 2019-11-05 DIAGNOSIS — G40909 Epilepsy, unspecified, not intractable, without status epilepticus: Secondary | ICD-10-CM | POA: Diagnosis not present

## 2019-11-05 DIAGNOSIS — I69320 Aphasia following cerebral infarction: Secondary | ICD-10-CM | POA: Diagnosis not present

## 2019-11-05 DIAGNOSIS — I69365 Other paralytic syndrome following cerebral infarction, bilateral: Secondary | ICD-10-CM | POA: Diagnosis not present

## 2019-11-06 DIAGNOSIS — Z9181 History of falling: Secondary | ICD-10-CM | POA: Diagnosis not present

## 2019-11-06 DIAGNOSIS — I69365 Other paralytic syndrome following cerebral infarction, bilateral: Secondary | ICD-10-CM | POA: Diagnosis not present

## 2019-11-06 DIAGNOSIS — E1165 Type 2 diabetes mellitus with hyperglycemia: Secondary | ICD-10-CM | POA: Diagnosis not present

## 2019-11-06 DIAGNOSIS — Z7401 Bed confinement status: Secondary | ICD-10-CM | POA: Diagnosis not present

## 2019-11-06 DIAGNOSIS — R000 Tachycardia, unspecified: Secondary | ICD-10-CM | POA: Diagnosis not present

## 2019-11-06 DIAGNOSIS — D649 Anemia, unspecified: Secondary | ICD-10-CM | POA: Diagnosis not present

## 2019-11-06 DIAGNOSIS — Z87440 Personal history of urinary (tract) infections: Secondary | ICD-10-CM | POA: Diagnosis not present

## 2019-11-06 DIAGNOSIS — G4733 Obstructive sleep apnea (adult) (pediatric): Secondary | ICD-10-CM | POA: Diagnosis not present

## 2019-11-06 DIAGNOSIS — R339 Retention of urine, unspecified: Secondary | ICD-10-CM | POA: Diagnosis not present

## 2019-11-06 DIAGNOSIS — G40909 Epilepsy, unspecified, not intractable, without status epilepticus: Secondary | ICD-10-CM | POA: Diagnosis not present

## 2019-11-06 DIAGNOSIS — Z86718 Personal history of other venous thrombosis and embolism: Secondary | ICD-10-CM | POA: Diagnosis not present

## 2019-11-06 DIAGNOSIS — I69320 Aphasia following cerebral infarction: Secondary | ICD-10-CM | POA: Diagnosis not present

## 2019-11-06 DIAGNOSIS — G8250 Quadriplegia, unspecified: Secondary | ICD-10-CM | POA: Diagnosis not present

## 2019-11-06 DIAGNOSIS — I10 Essential (primary) hypertension: Secondary | ICD-10-CM | POA: Diagnosis not present

## 2019-11-06 DIAGNOSIS — Z6841 Body Mass Index (BMI) 40.0 and over, adult: Secondary | ICD-10-CM | POA: Diagnosis not present

## 2019-11-07 DIAGNOSIS — Z87440 Personal history of urinary (tract) infections: Secondary | ICD-10-CM | POA: Diagnosis not present

## 2019-11-07 DIAGNOSIS — I69365 Other paralytic syndrome following cerebral infarction, bilateral: Secondary | ICD-10-CM | POA: Diagnosis not present

## 2019-11-07 DIAGNOSIS — I69320 Aphasia following cerebral infarction: Secondary | ICD-10-CM | POA: Diagnosis not present

## 2019-11-07 DIAGNOSIS — Z7401 Bed confinement status: Secondary | ICD-10-CM | POA: Diagnosis not present

## 2019-11-07 DIAGNOSIS — Z9181 History of falling: Secondary | ICD-10-CM | POA: Diagnosis not present

## 2019-11-07 DIAGNOSIS — I10 Essential (primary) hypertension: Secondary | ICD-10-CM | POA: Diagnosis not present

## 2019-11-07 DIAGNOSIS — R000 Tachycardia, unspecified: Secondary | ICD-10-CM | POA: Diagnosis not present

## 2019-11-07 DIAGNOSIS — L89154 Pressure ulcer of sacral region, stage 4: Secondary | ICD-10-CM | POA: Diagnosis not present

## 2019-11-07 DIAGNOSIS — Z86718 Personal history of other venous thrombosis and embolism: Secondary | ICD-10-CM | POA: Diagnosis not present

## 2019-11-07 DIAGNOSIS — D649 Anemia, unspecified: Secondary | ICD-10-CM | POA: Diagnosis not present

## 2019-11-07 DIAGNOSIS — T8189XA Other complications of procedures, not elsewhere classified, initial encounter: Secondary | ICD-10-CM | POA: Diagnosis not present

## 2019-11-07 DIAGNOSIS — E1165 Type 2 diabetes mellitus with hyperglycemia: Secondary | ICD-10-CM | POA: Diagnosis not present

## 2019-11-07 DIAGNOSIS — G40909 Epilepsy, unspecified, not intractable, without status epilepticus: Secondary | ICD-10-CM | POA: Diagnosis not present

## 2019-11-07 DIAGNOSIS — G8250 Quadriplegia, unspecified: Secondary | ICD-10-CM | POA: Diagnosis not present

## 2019-11-07 DIAGNOSIS — Z6841 Body Mass Index (BMI) 40.0 and over, adult: Secondary | ICD-10-CM | POA: Diagnosis not present

## 2019-11-07 DIAGNOSIS — G4733 Obstructive sleep apnea (adult) (pediatric): Secondary | ICD-10-CM | POA: Diagnosis not present

## 2019-11-07 DIAGNOSIS — R339 Retention of urine, unspecified: Secondary | ICD-10-CM | POA: Diagnosis not present

## 2019-11-08 DIAGNOSIS — D649 Anemia, unspecified: Secondary | ICD-10-CM | POA: Diagnosis not present

## 2019-11-08 DIAGNOSIS — G8250 Quadriplegia, unspecified: Secondary | ICD-10-CM | POA: Diagnosis not present

## 2019-11-08 DIAGNOSIS — R339 Retention of urine, unspecified: Secondary | ICD-10-CM | POA: Diagnosis not present

## 2019-11-08 DIAGNOSIS — Z7401 Bed confinement status: Secondary | ICD-10-CM | POA: Diagnosis not present

## 2019-11-08 DIAGNOSIS — E1165 Type 2 diabetes mellitus with hyperglycemia: Secondary | ICD-10-CM | POA: Diagnosis not present

## 2019-11-08 DIAGNOSIS — Z6841 Body Mass Index (BMI) 40.0 and over, adult: Secondary | ICD-10-CM | POA: Diagnosis not present

## 2019-11-08 DIAGNOSIS — I10 Essential (primary) hypertension: Secondary | ICD-10-CM | POA: Diagnosis not present

## 2019-11-08 DIAGNOSIS — G40909 Epilepsy, unspecified, not intractable, without status epilepticus: Secondary | ICD-10-CM | POA: Diagnosis not present

## 2019-11-08 DIAGNOSIS — I69365 Other paralytic syndrome following cerebral infarction, bilateral: Secondary | ICD-10-CM | POA: Diagnosis not present

## 2019-11-08 DIAGNOSIS — I69320 Aphasia following cerebral infarction: Secondary | ICD-10-CM | POA: Diagnosis not present

## 2019-11-08 DIAGNOSIS — R000 Tachycardia, unspecified: Secondary | ICD-10-CM | POA: Diagnosis not present

## 2019-11-08 DIAGNOSIS — Z9181 History of falling: Secondary | ICD-10-CM | POA: Diagnosis not present

## 2019-11-08 DIAGNOSIS — Z87440 Personal history of urinary (tract) infections: Secondary | ICD-10-CM | POA: Diagnosis not present

## 2019-11-08 DIAGNOSIS — G4733 Obstructive sleep apnea (adult) (pediatric): Secondary | ICD-10-CM | POA: Diagnosis not present

## 2019-11-08 DIAGNOSIS — Z86718 Personal history of other venous thrombosis and embolism: Secondary | ICD-10-CM | POA: Diagnosis not present

## 2019-11-09 DIAGNOSIS — I6389 Other cerebral infarction: Secondary | ICD-10-CM | POA: Diagnosis not present

## 2019-11-09 DIAGNOSIS — L8994 Pressure ulcer of unspecified site, stage 4: Secondary | ICD-10-CM | POA: Diagnosis not present

## 2019-11-09 DIAGNOSIS — G4733 Obstructive sleep apnea (adult) (pediatric): Secondary | ICD-10-CM | POA: Diagnosis not present

## 2019-11-11 DIAGNOSIS — Z930 Tracheostomy status: Secondary | ICD-10-CM | POA: Diagnosis not present

## 2019-11-12 DIAGNOSIS — Z87440 Personal history of urinary (tract) infections: Secondary | ICD-10-CM | POA: Diagnosis not present

## 2019-11-12 DIAGNOSIS — Z9181 History of falling: Secondary | ICD-10-CM | POA: Diagnosis not present

## 2019-11-12 DIAGNOSIS — I10 Essential (primary) hypertension: Secondary | ICD-10-CM | POA: Diagnosis not present

## 2019-11-12 DIAGNOSIS — Z86718 Personal history of other venous thrombosis and embolism: Secondary | ICD-10-CM | POA: Diagnosis not present

## 2019-11-12 DIAGNOSIS — I69320 Aphasia following cerebral infarction: Secondary | ICD-10-CM | POA: Diagnosis not present

## 2019-11-12 DIAGNOSIS — G40909 Epilepsy, unspecified, not intractable, without status epilepticus: Secondary | ICD-10-CM | POA: Diagnosis not present

## 2019-11-12 DIAGNOSIS — I69365 Other paralytic syndrome following cerebral infarction, bilateral: Secondary | ICD-10-CM | POA: Diagnosis not present

## 2019-11-12 DIAGNOSIS — E1165 Type 2 diabetes mellitus with hyperglycemia: Secondary | ICD-10-CM | POA: Diagnosis not present

## 2019-11-12 DIAGNOSIS — D649 Anemia, unspecified: Secondary | ICD-10-CM | POA: Diagnosis not present

## 2019-11-12 DIAGNOSIS — Z6841 Body Mass Index (BMI) 40.0 and over, adult: Secondary | ICD-10-CM | POA: Diagnosis not present

## 2019-11-12 DIAGNOSIS — G4733 Obstructive sleep apnea (adult) (pediatric): Secondary | ICD-10-CM | POA: Diagnosis not present

## 2019-11-12 DIAGNOSIS — R339 Retention of urine, unspecified: Secondary | ICD-10-CM | POA: Diagnosis not present

## 2019-11-12 DIAGNOSIS — Z7401 Bed confinement status: Secondary | ICD-10-CM | POA: Diagnosis not present

## 2019-11-12 DIAGNOSIS — R000 Tachycardia, unspecified: Secondary | ICD-10-CM | POA: Diagnosis not present

## 2019-11-12 DIAGNOSIS — G8250 Quadriplegia, unspecified: Secondary | ICD-10-CM | POA: Diagnosis not present

## 2019-11-13 DIAGNOSIS — G40909 Epilepsy, unspecified, not intractable, without status epilepticus: Secondary | ICD-10-CM | POA: Diagnosis not present

## 2019-11-13 DIAGNOSIS — Z9181 History of falling: Secondary | ICD-10-CM | POA: Diagnosis not present

## 2019-11-13 DIAGNOSIS — I10 Essential (primary) hypertension: Secondary | ICD-10-CM | POA: Diagnosis not present

## 2019-11-13 DIAGNOSIS — Z6841 Body Mass Index (BMI) 40.0 and over, adult: Secondary | ICD-10-CM | POA: Diagnosis not present

## 2019-11-13 DIAGNOSIS — D649 Anemia, unspecified: Secondary | ICD-10-CM | POA: Diagnosis not present

## 2019-11-13 DIAGNOSIS — R339 Retention of urine, unspecified: Secondary | ICD-10-CM | POA: Diagnosis not present

## 2019-11-13 DIAGNOSIS — G8250 Quadriplegia, unspecified: Secondary | ICD-10-CM | POA: Diagnosis not present

## 2019-11-13 DIAGNOSIS — Z87440 Personal history of urinary (tract) infections: Secondary | ICD-10-CM | POA: Diagnosis not present

## 2019-11-13 DIAGNOSIS — R000 Tachycardia, unspecified: Secondary | ICD-10-CM | POA: Diagnosis not present

## 2019-11-13 DIAGNOSIS — I69320 Aphasia following cerebral infarction: Secondary | ICD-10-CM | POA: Diagnosis not present

## 2019-11-13 DIAGNOSIS — I69365 Other paralytic syndrome following cerebral infarction, bilateral: Secondary | ICD-10-CM | POA: Diagnosis not present

## 2019-11-13 DIAGNOSIS — E1165 Type 2 diabetes mellitus with hyperglycemia: Secondary | ICD-10-CM | POA: Diagnosis not present

## 2019-11-13 DIAGNOSIS — G4733 Obstructive sleep apnea (adult) (pediatric): Secondary | ICD-10-CM | POA: Diagnosis not present

## 2019-11-13 DIAGNOSIS — Z7401 Bed confinement status: Secondary | ICD-10-CM | POA: Diagnosis not present

## 2019-11-13 DIAGNOSIS — Z86718 Personal history of other venous thrombosis and embolism: Secondary | ICD-10-CM | POA: Diagnosis not present

## 2019-11-14 DIAGNOSIS — L8994 Pressure ulcer of unspecified site, stage 4: Secondary | ICD-10-CM | POA: Diagnosis not present

## 2019-11-14 DIAGNOSIS — R000 Tachycardia, unspecified: Secondary | ICD-10-CM | POA: Diagnosis not present

## 2019-11-14 DIAGNOSIS — D649 Anemia, unspecified: Secondary | ICD-10-CM | POA: Diagnosis not present

## 2019-11-14 DIAGNOSIS — E1165 Type 2 diabetes mellitus with hyperglycemia: Secondary | ICD-10-CM | POA: Diagnosis not present

## 2019-11-14 DIAGNOSIS — Z87440 Personal history of urinary (tract) infections: Secondary | ICD-10-CM | POA: Diagnosis not present

## 2019-11-14 DIAGNOSIS — I6389 Other cerebral infarction: Secondary | ICD-10-CM | POA: Diagnosis not present

## 2019-11-14 DIAGNOSIS — G4733 Obstructive sleep apnea (adult) (pediatric): Secondary | ICD-10-CM | POA: Diagnosis not present

## 2019-11-14 DIAGNOSIS — G8250 Quadriplegia, unspecified: Secondary | ICD-10-CM | POA: Diagnosis not present

## 2019-11-14 DIAGNOSIS — I69365 Other paralytic syndrome following cerebral infarction, bilateral: Secondary | ICD-10-CM | POA: Diagnosis not present

## 2019-11-14 DIAGNOSIS — G40909 Epilepsy, unspecified, not intractable, without status epilepticus: Secondary | ICD-10-CM | POA: Diagnosis not present

## 2019-11-14 DIAGNOSIS — Z7401 Bed confinement status: Secondary | ICD-10-CM | POA: Diagnosis not present

## 2019-11-14 DIAGNOSIS — Z9181 History of falling: Secondary | ICD-10-CM | POA: Diagnosis not present

## 2019-11-14 DIAGNOSIS — I10 Essential (primary) hypertension: Secondary | ICD-10-CM | POA: Diagnosis not present

## 2019-11-14 DIAGNOSIS — Z86718 Personal history of other venous thrombosis and embolism: Secondary | ICD-10-CM | POA: Diagnosis not present

## 2019-11-14 DIAGNOSIS — R339 Retention of urine, unspecified: Secondary | ICD-10-CM | POA: Diagnosis not present

## 2019-11-14 DIAGNOSIS — Z6841 Body Mass Index (BMI) 40.0 and over, adult: Secondary | ICD-10-CM | POA: Diagnosis not present

## 2019-11-14 DIAGNOSIS — I69320 Aphasia following cerebral infarction: Secondary | ICD-10-CM | POA: Diagnosis not present

## 2019-11-15 DIAGNOSIS — Z87440 Personal history of urinary (tract) infections: Secondary | ICD-10-CM | POA: Diagnosis not present

## 2019-11-15 DIAGNOSIS — Z7401 Bed confinement status: Secondary | ICD-10-CM | POA: Diagnosis not present

## 2019-11-15 DIAGNOSIS — G4733 Obstructive sleep apnea (adult) (pediatric): Secondary | ICD-10-CM | POA: Diagnosis not present

## 2019-11-15 DIAGNOSIS — Z930 Tracheostomy status: Secondary | ICD-10-CM | POA: Diagnosis not present

## 2019-11-15 DIAGNOSIS — R000 Tachycardia, unspecified: Secondary | ICD-10-CM | POA: Diagnosis not present

## 2019-11-15 DIAGNOSIS — I10 Essential (primary) hypertension: Secondary | ICD-10-CM | POA: Diagnosis not present

## 2019-11-15 DIAGNOSIS — Z86718 Personal history of other venous thrombosis and embolism: Secondary | ICD-10-CM | POA: Diagnosis not present

## 2019-11-15 DIAGNOSIS — G8250 Quadriplegia, unspecified: Secondary | ICD-10-CM | POA: Diagnosis not present

## 2019-11-15 DIAGNOSIS — I69365 Other paralytic syndrome following cerebral infarction, bilateral: Secondary | ICD-10-CM | POA: Diagnosis not present

## 2019-11-15 DIAGNOSIS — E1165 Type 2 diabetes mellitus with hyperglycemia: Secondary | ICD-10-CM | POA: Diagnosis not present

## 2019-11-15 DIAGNOSIS — I69320 Aphasia following cerebral infarction: Secondary | ICD-10-CM | POA: Diagnosis not present

## 2019-11-15 DIAGNOSIS — Z9181 History of falling: Secondary | ICD-10-CM | POA: Diagnosis not present

## 2019-11-15 DIAGNOSIS — Z6841 Body Mass Index (BMI) 40.0 and over, adult: Secondary | ICD-10-CM | POA: Diagnosis not present

## 2019-11-15 DIAGNOSIS — G40909 Epilepsy, unspecified, not intractable, without status epilepticus: Secondary | ICD-10-CM | POA: Diagnosis not present

## 2019-11-15 DIAGNOSIS — R339 Retention of urine, unspecified: Secondary | ICD-10-CM | POA: Diagnosis not present

## 2019-11-15 DIAGNOSIS — D649 Anemia, unspecified: Secondary | ICD-10-CM | POA: Diagnosis not present

## 2019-11-18 DIAGNOSIS — I6389 Other cerebral infarction: Secondary | ICD-10-CM | POA: Diagnosis not present

## 2019-11-18 DIAGNOSIS — R32 Unspecified urinary incontinence: Secondary | ICD-10-CM | POA: Diagnosis not present

## 2019-11-19 DIAGNOSIS — R000 Tachycardia, unspecified: Secondary | ICD-10-CM | POA: Diagnosis not present

## 2019-11-19 DIAGNOSIS — E1165 Type 2 diabetes mellitus with hyperglycemia: Secondary | ICD-10-CM | POA: Diagnosis not present

## 2019-11-19 DIAGNOSIS — G4733 Obstructive sleep apnea (adult) (pediatric): Secondary | ICD-10-CM | POA: Diagnosis not present

## 2019-11-19 DIAGNOSIS — G8250 Quadriplegia, unspecified: Secondary | ICD-10-CM | POA: Diagnosis not present

## 2019-11-19 DIAGNOSIS — D649 Anemia, unspecified: Secondary | ICD-10-CM | POA: Diagnosis not present

## 2019-11-19 DIAGNOSIS — Z86718 Personal history of other venous thrombosis and embolism: Secondary | ICD-10-CM | POA: Diagnosis not present

## 2019-11-19 DIAGNOSIS — G40909 Epilepsy, unspecified, not intractable, without status epilepticus: Secondary | ICD-10-CM | POA: Diagnosis not present

## 2019-11-19 DIAGNOSIS — Z87440 Personal history of urinary (tract) infections: Secondary | ICD-10-CM | POA: Diagnosis not present

## 2019-11-19 DIAGNOSIS — R339 Retention of urine, unspecified: Secondary | ICD-10-CM | POA: Diagnosis not present

## 2019-11-19 DIAGNOSIS — I69320 Aphasia following cerebral infarction: Secondary | ICD-10-CM | POA: Diagnosis not present

## 2019-11-19 DIAGNOSIS — Z6841 Body Mass Index (BMI) 40.0 and over, adult: Secondary | ICD-10-CM | POA: Diagnosis not present

## 2019-11-19 DIAGNOSIS — Z9181 History of falling: Secondary | ICD-10-CM | POA: Diagnosis not present

## 2019-11-19 DIAGNOSIS — I69365 Other paralytic syndrome following cerebral infarction, bilateral: Secondary | ICD-10-CM | POA: Diagnosis not present

## 2019-11-19 DIAGNOSIS — I10 Essential (primary) hypertension: Secondary | ICD-10-CM | POA: Diagnosis not present

## 2019-11-19 DIAGNOSIS — Z7401 Bed confinement status: Secondary | ICD-10-CM | POA: Diagnosis not present

## 2019-11-20 DIAGNOSIS — I69365 Other paralytic syndrome following cerebral infarction, bilateral: Secondary | ICD-10-CM | POA: Diagnosis not present

## 2019-11-20 DIAGNOSIS — E119 Type 2 diabetes mellitus without complications: Secondary | ICD-10-CM | POA: Diagnosis not present

## 2019-11-20 DIAGNOSIS — I10 Essential (primary) hypertension: Secondary | ICD-10-CM | POA: Diagnosis not present

## 2019-11-20 DIAGNOSIS — G40909 Epilepsy, unspecified, not intractable, without status epilepticus: Secondary | ICD-10-CM | POA: Diagnosis not present

## 2019-11-20 DIAGNOSIS — G4733 Obstructive sleep apnea (adult) (pediatric): Secondary | ICD-10-CM | POA: Diagnosis not present

## 2019-11-20 DIAGNOSIS — Z7401 Bed confinement status: Secondary | ICD-10-CM | POA: Diagnosis not present

## 2019-11-20 DIAGNOSIS — Z9181 History of falling: Secondary | ICD-10-CM | POA: Diagnosis not present

## 2019-11-20 DIAGNOSIS — Z6841 Body Mass Index (BMI) 40.0 and over, adult: Secondary | ICD-10-CM | POA: Diagnosis not present

## 2019-11-20 DIAGNOSIS — D649 Anemia, unspecified: Secondary | ICD-10-CM | POA: Diagnosis not present

## 2019-11-20 DIAGNOSIS — G8250 Quadriplegia, unspecified: Secondary | ICD-10-CM | POA: Diagnosis not present

## 2019-11-20 DIAGNOSIS — E1165 Type 2 diabetes mellitus with hyperglycemia: Secondary | ICD-10-CM | POA: Diagnosis not present

## 2019-11-20 DIAGNOSIS — Z87440 Personal history of urinary (tract) infections: Secondary | ICD-10-CM | POA: Diagnosis not present

## 2019-11-20 DIAGNOSIS — R000 Tachycardia, unspecified: Secondary | ICD-10-CM | POA: Diagnosis not present

## 2019-11-20 DIAGNOSIS — R339 Retention of urine, unspecified: Secondary | ICD-10-CM | POA: Diagnosis not present

## 2019-11-20 DIAGNOSIS — Z86718 Personal history of other venous thrombosis and embolism: Secondary | ICD-10-CM | POA: Diagnosis not present

## 2019-11-20 DIAGNOSIS — I69320 Aphasia following cerebral infarction: Secondary | ICD-10-CM | POA: Diagnosis not present

## 2019-11-21 DIAGNOSIS — Z86718 Personal history of other venous thrombosis and embolism: Secondary | ICD-10-CM | POA: Diagnosis not present

## 2019-11-21 DIAGNOSIS — G4733 Obstructive sleep apnea (adult) (pediatric): Secondary | ICD-10-CM | POA: Diagnosis not present

## 2019-11-21 DIAGNOSIS — R339 Retention of urine, unspecified: Secondary | ICD-10-CM | POA: Diagnosis not present

## 2019-11-21 DIAGNOSIS — R000 Tachycardia, unspecified: Secondary | ICD-10-CM | POA: Diagnosis not present

## 2019-11-21 DIAGNOSIS — Z6841 Body Mass Index (BMI) 40.0 and over, adult: Secondary | ICD-10-CM | POA: Diagnosis not present

## 2019-11-21 DIAGNOSIS — I10 Essential (primary) hypertension: Secondary | ICD-10-CM | POA: Diagnosis not present

## 2019-11-21 DIAGNOSIS — Z87440 Personal history of urinary (tract) infections: Secondary | ICD-10-CM | POA: Diagnosis not present

## 2019-11-21 DIAGNOSIS — I69365 Other paralytic syndrome following cerebral infarction, bilateral: Secondary | ICD-10-CM | POA: Diagnosis not present

## 2019-11-21 DIAGNOSIS — G8250 Quadriplegia, unspecified: Secondary | ICD-10-CM | POA: Diagnosis not present

## 2019-11-21 DIAGNOSIS — Z7401 Bed confinement status: Secondary | ICD-10-CM | POA: Diagnosis not present

## 2019-11-21 DIAGNOSIS — G40909 Epilepsy, unspecified, not intractable, without status epilepticus: Secondary | ICD-10-CM | POA: Diagnosis not present

## 2019-11-21 DIAGNOSIS — D649 Anemia, unspecified: Secondary | ICD-10-CM | POA: Diagnosis not present

## 2019-11-21 DIAGNOSIS — Z9181 History of falling: Secondary | ICD-10-CM | POA: Diagnosis not present

## 2019-11-21 DIAGNOSIS — E1165 Type 2 diabetes mellitus with hyperglycemia: Secondary | ICD-10-CM | POA: Diagnosis not present

## 2019-11-21 DIAGNOSIS — I69320 Aphasia following cerebral infarction: Secondary | ICD-10-CM | POA: Diagnosis not present

## 2019-11-22 DIAGNOSIS — I6389 Other cerebral infarction: Secondary | ICD-10-CM | POA: Diagnosis not present

## 2019-11-22 DIAGNOSIS — L8994 Pressure ulcer of unspecified site, stage 4: Secondary | ICD-10-CM | POA: Diagnosis not present

## 2019-11-22 DIAGNOSIS — G4733 Obstructive sleep apnea (adult) (pediatric): Secondary | ICD-10-CM | POA: Diagnosis not present

## 2019-11-25 DIAGNOSIS — G4733 Obstructive sleep apnea (adult) (pediatric): Secondary | ICD-10-CM | POA: Diagnosis not present

## 2019-11-25 DIAGNOSIS — L8994 Pressure ulcer of unspecified site, stage 4: Secondary | ICD-10-CM | POA: Diagnosis not present

## 2019-11-25 DIAGNOSIS — I6389 Other cerebral infarction: Secondary | ICD-10-CM | POA: Diagnosis not present

## 2019-11-26 DIAGNOSIS — G4733 Obstructive sleep apnea (adult) (pediatric): Secondary | ICD-10-CM | POA: Diagnosis not present

## 2019-11-26 DIAGNOSIS — I69365 Other paralytic syndrome following cerebral infarction, bilateral: Secondary | ICD-10-CM | POA: Diagnosis not present

## 2019-11-26 DIAGNOSIS — D649 Anemia, unspecified: Secondary | ICD-10-CM | POA: Diagnosis not present

## 2019-11-26 DIAGNOSIS — I10 Essential (primary) hypertension: Secondary | ICD-10-CM | POA: Diagnosis not present

## 2019-11-26 DIAGNOSIS — Z9181 History of falling: Secondary | ICD-10-CM | POA: Diagnosis not present

## 2019-11-26 DIAGNOSIS — R339 Retention of urine, unspecified: Secondary | ICD-10-CM | POA: Diagnosis not present

## 2019-11-26 DIAGNOSIS — I69320 Aphasia following cerebral infarction: Secondary | ICD-10-CM | POA: Diagnosis not present

## 2019-11-26 DIAGNOSIS — E1165 Type 2 diabetes mellitus with hyperglycemia: Secondary | ICD-10-CM | POA: Diagnosis not present

## 2019-11-26 DIAGNOSIS — Z7401 Bed confinement status: Secondary | ICD-10-CM | POA: Diagnosis not present

## 2019-11-26 DIAGNOSIS — R000 Tachycardia, unspecified: Secondary | ICD-10-CM | POA: Diagnosis not present

## 2019-11-26 DIAGNOSIS — G8250 Quadriplegia, unspecified: Secondary | ICD-10-CM | POA: Diagnosis not present

## 2019-11-26 DIAGNOSIS — Z6841 Body Mass Index (BMI) 40.0 and over, adult: Secondary | ICD-10-CM | POA: Diagnosis not present

## 2019-11-26 DIAGNOSIS — G40909 Epilepsy, unspecified, not intractable, without status epilepticus: Secondary | ICD-10-CM | POA: Diagnosis not present

## 2019-11-26 DIAGNOSIS — Z86718 Personal history of other venous thrombosis and embolism: Secondary | ICD-10-CM | POA: Diagnosis not present

## 2019-11-26 DIAGNOSIS — Z87440 Personal history of urinary (tract) infections: Secondary | ICD-10-CM | POA: Diagnosis not present

## 2019-11-27 DIAGNOSIS — D649 Anemia, unspecified: Secondary | ICD-10-CM | POA: Diagnosis not present

## 2019-11-27 DIAGNOSIS — I69320 Aphasia following cerebral infarction: Secondary | ICD-10-CM | POA: Diagnosis not present

## 2019-11-27 DIAGNOSIS — Z6841 Body Mass Index (BMI) 40.0 and over, adult: Secondary | ICD-10-CM | POA: Diagnosis not present

## 2019-11-27 DIAGNOSIS — R339 Retention of urine, unspecified: Secondary | ICD-10-CM | POA: Diagnosis not present

## 2019-11-27 DIAGNOSIS — Z86718 Personal history of other venous thrombosis and embolism: Secondary | ICD-10-CM | POA: Diagnosis not present

## 2019-11-27 DIAGNOSIS — G4733 Obstructive sleep apnea (adult) (pediatric): Secondary | ICD-10-CM | POA: Diagnosis not present

## 2019-11-27 DIAGNOSIS — Z87440 Personal history of urinary (tract) infections: Secondary | ICD-10-CM | POA: Diagnosis not present

## 2019-11-27 DIAGNOSIS — Z7401 Bed confinement status: Secondary | ICD-10-CM | POA: Diagnosis not present

## 2019-11-27 DIAGNOSIS — G8250 Quadriplegia, unspecified: Secondary | ICD-10-CM | POA: Diagnosis not present

## 2019-11-27 DIAGNOSIS — Z9181 History of falling: Secondary | ICD-10-CM | POA: Diagnosis not present

## 2019-11-27 DIAGNOSIS — R000 Tachycardia, unspecified: Secondary | ICD-10-CM | POA: Diagnosis not present

## 2019-11-27 DIAGNOSIS — I10 Essential (primary) hypertension: Secondary | ICD-10-CM | POA: Diagnosis not present

## 2019-11-27 DIAGNOSIS — G40909 Epilepsy, unspecified, not intractable, without status epilepticus: Secondary | ICD-10-CM | POA: Diagnosis not present

## 2019-11-27 DIAGNOSIS — E1165 Type 2 diabetes mellitus with hyperglycemia: Secondary | ICD-10-CM | POA: Diagnosis not present

## 2019-11-27 DIAGNOSIS — I69365 Other paralytic syndrome following cerebral infarction, bilateral: Secondary | ICD-10-CM | POA: Diagnosis not present

## 2019-11-28 DIAGNOSIS — I69365 Other paralytic syndrome following cerebral infarction, bilateral: Secondary | ICD-10-CM | POA: Diagnosis not present

## 2019-11-28 DIAGNOSIS — Z87440 Personal history of urinary (tract) infections: Secondary | ICD-10-CM | POA: Diagnosis not present

## 2019-11-28 DIAGNOSIS — Z86718 Personal history of other venous thrombosis and embolism: Secondary | ICD-10-CM | POA: Diagnosis not present

## 2019-11-28 DIAGNOSIS — G40909 Epilepsy, unspecified, not intractable, without status epilepticus: Secondary | ICD-10-CM | POA: Diagnosis not present

## 2019-11-28 DIAGNOSIS — Z6841 Body Mass Index (BMI) 40.0 and over, adult: Secondary | ICD-10-CM | POA: Diagnosis not present

## 2019-11-28 DIAGNOSIS — I69320 Aphasia following cerebral infarction: Secondary | ICD-10-CM | POA: Diagnosis not present

## 2019-11-28 DIAGNOSIS — Z7401 Bed confinement status: Secondary | ICD-10-CM | POA: Diagnosis not present

## 2019-11-28 DIAGNOSIS — G4733 Obstructive sleep apnea (adult) (pediatric): Secondary | ICD-10-CM | POA: Diagnosis not present

## 2019-11-28 DIAGNOSIS — R000 Tachycardia, unspecified: Secondary | ICD-10-CM | POA: Diagnosis not present

## 2019-11-28 DIAGNOSIS — G8250 Quadriplegia, unspecified: Secondary | ICD-10-CM | POA: Diagnosis not present

## 2019-11-28 DIAGNOSIS — E1165 Type 2 diabetes mellitus with hyperglycemia: Secondary | ICD-10-CM | POA: Diagnosis not present

## 2019-11-28 DIAGNOSIS — R339 Retention of urine, unspecified: Secondary | ICD-10-CM | POA: Diagnosis not present

## 2019-11-28 DIAGNOSIS — Z9181 History of falling: Secondary | ICD-10-CM | POA: Diagnosis not present

## 2019-11-28 DIAGNOSIS — D649 Anemia, unspecified: Secondary | ICD-10-CM | POA: Diagnosis not present

## 2019-11-28 DIAGNOSIS — I10 Essential (primary) hypertension: Secondary | ICD-10-CM | POA: Diagnosis not present

## 2019-11-29 DIAGNOSIS — T8189XA Other complications of procedures, not elsewhere classified, initial encounter: Secondary | ICD-10-CM | POA: Diagnosis not present

## 2019-12-03 DIAGNOSIS — G4733 Obstructive sleep apnea (adult) (pediatric): Secondary | ICD-10-CM | POA: Diagnosis not present

## 2019-12-03 DIAGNOSIS — R339 Retention of urine, unspecified: Secondary | ICD-10-CM | POA: Diagnosis not present

## 2019-12-03 DIAGNOSIS — E1165 Type 2 diabetes mellitus with hyperglycemia: Secondary | ICD-10-CM | POA: Diagnosis not present

## 2019-12-03 DIAGNOSIS — G8250 Quadriplegia, unspecified: Secondary | ICD-10-CM | POA: Diagnosis not present

## 2019-12-03 DIAGNOSIS — Z87440 Personal history of urinary (tract) infections: Secondary | ICD-10-CM | POA: Diagnosis not present

## 2019-12-03 DIAGNOSIS — Z86718 Personal history of other venous thrombosis and embolism: Secondary | ICD-10-CM | POA: Diagnosis not present

## 2019-12-03 DIAGNOSIS — Z7401 Bed confinement status: Secondary | ICD-10-CM | POA: Diagnosis not present

## 2019-12-03 DIAGNOSIS — Z9181 History of falling: Secondary | ICD-10-CM | POA: Diagnosis not present

## 2019-12-03 DIAGNOSIS — Z6841 Body Mass Index (BMI) 40.0 and over, adult: Secondary | ICD-10-CM | POA: Diagnosis not present

## 2019-12-03 DIAGNOSIS — R000 Tachycardia, unspecified: Secondary | ICD-10-CM | POA: Diagnosis not present

## 2019-12-03 DIAGNOSIS — I69320 Aphasia following cerebral infarction: Secondary | ICD-10-CM | POA: Diagnosis not present

## 2019-12-03 DIAGNOSIS — G40909 Epilepsy, unspecified, not intractable, without status epilepticus: Secondary | ICD-10-CM | POA: Diagnosis not present

## 2019-12-03 DIAGNOSIS — I69365 Other paralytic syndrome following cerebral infarction, bilateral: Secondary | ICD-10-CM | POA: Diagnosis not present

## 2019-12-03 DIAGNOSIS — I10 Essential (primary) hypertension: Secondary | ICD-10-CM | POA: Diagnosis not present

## 2019-12-03 DIAGNOSIS — D649 Anemia, unspecified: Secondary | ICD-10-CM | POA: Diagnosis not present

## 2019-12-04 DIAGNOSIS — G40909 Epilepsy, unspecified, not intractable, without status epilepticus: Secondary | ICD-10-CM | POA: Diagnosis not present

## 2019-12-04 DIAGNOSIS — I69320 Aphasia following cerebral infarction: Secondary | ICD-10-CM | POA: Diagnosis not present

## 2019-12-04 DIAGNOSIS — D649 Anemia, unspecified: Secondary | ICD-10-CM | POA: Diagnosis not present

## 2019-12-04 DIAGNOSIS — Z87440 Personal history of urinary (tract) infections: Secondary | ICD-10-CM | POA: Diagnosis not present

## 2019-12-04 DIAGNOSIS — Z9181 History of falling: Secondary | ICD-10-CM | POA: Diagnosis not present

## 2019-12-04 DIAGNOSIS — R000 Tachycardia, unspecified: Secondary | ICD-10-CM | POA: Diagnosis not present

## 2019-12-04 DIAGNOSIS — I69365 Other paralytic syndrome following cerebral infarction, bilateral: Secondary | ICD-10-CM | POA: Diagnosis not present

## 2019-12-04 DIAGNOSIS — G4733 Obstructive sleep apnea (adult) (pediatric): Secondary | ICD-10-CM | POA: Diagnosis not present

## 2019-12-04 DIAGNOSIS — G8250 Quadriplegia, unspecified: Secondary | ICD-10-CM | POA: Diagnosis not present

## 2019-12-04 DIAGNOSIS — Z6841 Body Mass Index (BMI) 40.0 and over, adult: Secondary | ICD-10-CM | POA: Diagnosis not present

## 2019-12-04 DIAGNOSIS — E1165 Type 2 diabetes mellitus with hyperglycemia: Secondary | ICD-10-CM | POA: Diagnosis not present

## 2019-12-04 DIAGNOSIS — Z7401 Bed confinement status: Secondary | ICD-10-CM | POA: Diagnosis not present

## 2019-12-04 DIAGNOSIS — Z86718 Personal history of other venous thrombosis and embolism: Secondary | ICD-10-CM | POA: Diagnosis not present

## 2019-12-04 DIAGNOSIS — I10 Essential (primary) hypertension: Secondary | ICD-10-CM | POA: Diagnosis not present

## 2019-12-04 DIAGNOSIS — R339 Retention of urine, unspecified: Secondary | ICD-10-CM | POA: Diagnosis not present

## 2019-12-05 DIAGNOSIS — I69365 Other paralytic syndrome following cerebral infarction, bilateral: Secondary | ICD-10-CM | POA: Diagnosis not present

## 2019-12-05 DIAGNOSIS — G40909 Epilepsy, unspecified, not intractable, without status epilepticus: Secondary | ICD-10-CM | POA: Diagnosis not present

## 2019-12-05 DIAGNOSIS — R339 Retention of urine, unspecified: Secondary | ICD-10-CM | POA: Diagnosis not present

## 2019-12-05 DIAGNOSIS — Z9181 History of falling: Secondary | ICD-10-CM | POA: Diagnosis not present

## 2019-12-05 DIAGNOSIS — Z6841 Body Mass Index (BMI) 40.0 and over, adult: Secondary | ICD-10-CM | POA: Diagnosis not present

## 2019-12-05 DIAGNOSIS — D649 Anemia, unspecified: Secondary | ICD-10-CM | POA: Diagnosis not present

## 2019-12-05 DIAGNOSIS — Z7401 Bed confinement status: Secondary | ICD-10-CM | POA: Diagnosis not present

## 2019-12-05 DIAGNOSIS — I69320 Aphasia following cerebral infarction: Secondary | ICD-10-CM | POA: Diagnosis not present

## 2019-12-05 DIAGNOSIS — Z87440 Personal history of urinary (tract) infections: Secondary | ICD-10-CM | POA: Diagnosis not present

## 2019-12-05 DIAGNOSIS — R000 Tachycardia, unspecified: Secondary | ICD-10-CM | POA: Diagnosis not present

## 2019-12-05 DIAGNOSIS — G4733 Obstructive sleep apnea (adult) (pediatric): Secondary | ICD-10-CM | POA: Diagnosis not present

## 2019-12-05 DIAGNOSIS — G8250 Quadriplegia, unspecified: Secondary | ICD-10-CM | POA: Diagnosis not present

## 2019-12-05 DIAGNOSIS — I10 Essential (primary) hypertension: Secondary | ICD-10-CM | POA: Diagnosis not present

## 2019-12-05 DIAGNOSIS — E1165 Type 2 diabetes mellitus with hyperglycemia: Secondary | ICD-10-CM | POA: Diagnosis not present

## 2019-12-05 DIAGNOSIS — Z86718 Personal history of other venous thrombosis and embolism: Secondary | ICD-10-CM | POA: Diagnosis not present

## 2019-12-06 DIAGNOSIS — R000 Tachycardia, unspecified: Secondary | ICD-10-CM | POA: Diagnosis not present

## 2019-12-06 DIAGNOSIS — G40909 Epilepsy, unspecified, not intractable, without status epilepticus: Secondary | ICD-10-CM | POA: Diagnosis not present

## 2019-12-06 DIAGNOSIS — Z7401 Bed confinement status: Secondary | ICD-10-CM | POA: Diagnosis not present

## 2019-12-06 DIAGNOSIS — G8250 Quadriplegia, unspecified: Secondary | ICD-10-CM | POA: Diagnosis not present

## 2019-12-06 DIAGNOSIS — I69320 Aphasia following cerebral infarction: Secondary | ICD-10-CM | POA: Diagnosis not present

## 2019-12-06 DIAGNOSIS — I69365 Other paralytic syndrome following cerebral infarction, bilateral: Secondary | ICD-10-CM | POA: Diagnosis not present

## 2019-12-06 DIAGNOSIS — Z9181 History of falling: Secondary | ICD-10-CM | POA: Diagnosis not present

## 2019-12-06 DIAGNOSIS — G4733 Obstructive sleep apnea (adult) (pediatric): Secondary | ICD-10-CM | POA: Diagnosis not present

## 2019-12-06 DIAGNOSIS — Z87440 Personal history of urinary (tract) infections: Secondary | ICD-10-CM | POA: Diagnosis not present

## 2019-12-06 DIAGNOSIS — D649 Anemia, unspecified: Secondary | ICD-10-CM | POA: Diagnosis not present

## 2019-12-06 DIAGNOSIS — E1165 Type 2 diabetes mellitus with hyperglycemia: Secondary | ICD-10-CM | POA: Diagnosis not present

## 2019-12-06 DIAGNOSIS — R339 Retention of urine, unspecified: Secondary | ICD-10-CM | POA: Diagnosis not present

## 2019-12-06 DIAGNOSIS — Z86718 Personal history of other venous thrombosis and embolism: Secondary | ICD-10-CM | POA: Diagnosis not present

## 2019-12-06 DIAGNOSIS — I10 Essential (primary) hypertension: Secondary | ICD-10-CM | POA: Diagnosis not present

## 2019-12-06 DIAGNOSIS — Z6841 Body Mass Index (BMI) 40.0 and over, adult: Secondary | ICD-10-CM | POA: Diagnosis not present

## 2019-12-08 DIAGNOSIS — L89154 Pressure ulcer of sacral region, stage 4: Secondary | ICD-10-CM | POA: Diagnosis not present

## 2019-12-08 DIAGNOSIS — T8189XA Other complications of procedures, not elsewhere classified, initial encounter: Secondary | ICD-10-CM | POA: Diagnosis not present

## 2019-12-10 DIAGNOSIS — G4733 Obstructive sleep apnea (adult) (pediatric): Secondary | ICD-10-CM | POA: Diagnosis not present

## 2019-12-10 DIAGNOSIS — G8250 Quadriplegia, unspecified: Secondary | ICD-10-CM | POA: Diagnosis not present

## 2019-12-10 DIAGNOSIS — I10 Essential (primary) hypertension: Secondary | ICD-10-CM | POA: Diagnosis not present

## 2019-12-10 DIAGNOSIS — Z9181 History of falling: Secondary | ICD-10-CM | POA: Diagnosis not present

## 2019-12-10 DIAGNOSIS — I69320 Aphasia following cerebral infarction: Secondary | ICD-10-CM | POA: Diagnosis not present

## 2019-12-10 DIAGNOSIS — I69365 Other paralytic syndrome following cerebral infarction, bilateral: Secondary | ICD-10-CM | POA: Diagnosis not present

## 2019-12-10 DIAGNOSIS — Z87440 Personal history of urinary (tract) infections: Secondary | ICD-10-CM | POA: Diagnosis not present

## 2019-12-10 DIAGNOSIS — R339 Retention of urine, unspecified: Secondary | ICD-10-CM | POA: Diagnosis not present

## 2019-12-10 DIAGNOSIS — I6389 Other cerebral infarction: Secondary | ICD-10-CM | POA: Diagnosis not present

## 2019-12-10 DIAGNOSIS — L8994 Pressure ulcer of unspecified site, stage 4: Secondary | ICD-10-CM | POA: Diagnosis not present

## 2019-12-10 DIAGNOSIS — D649 Anemia, unspecified: Secondary | ICD-10-CM | POA: Diagnosis not present

## 2019-12-10 DIAGNOSIS — Z7401 Bed confinement status: Secondary | ICD-10-CM | POA: Diagnosis not present

## 2019-12-10 DIAGNOSIS — Z86718 Personal history of other venous thrombosis and embolism: Secondary | ICD-10-CM | POA: Diagnosis not present

## 2019-12-10 DIAGNOSIS — E1165 Type 2 diabetes mellitus with hyperglycemia: Secondary | ICD-10-CM | POA: Diagnosis not present

## 2019-12-10 DIAGNOSIS — G40909 Epilepsy, unspecified, not intractable, without status epilepticus: Secondary | ICD-10-CM | POA: Diagnosis not present

## 2019-12-10 DIAGNOSIS — Z6841 Body Mass Index (BMI) 40.0 and over, adult: Secondary | ICD-10-CM | POA: Diagnosis not present

## 2019-12-10 DIAGNOSIS — R000 Tachycardia, unspecified: Secondary | ICD-10-CM | POA: Diagnosis not present

## 2019-12-11 DIAGNOSIS — Z87440 Personal history of urinary (tract) infections: Secondary | ICD-10-CM | POA: Diagnosis not present

## 2019-12-11 DIAGNOSIS — I69320 Aphasia following cerebral infarction: Secondary | ICD-10-CM | POA: Diagnosis not present

## 2019-12-11 DIAGNOSIS — G8250 Quadriplegia, unspecified: Secondary | ICD-10-CM | POA: Diagnosis not present

## 2019-12-11 DIAGNOSIS — Z7401 Bed confinement status: Secondary | ICD-10-CM | POA: Diagnosis not present

## 2019-12-11 DIAGNOSIS — I10 Essential (primary) hypertension: Secondary | ICD-10-CM | POA: Diagnosis not present

## 2019-12-11 DIAGNOSIS — E1165 Type 2 diabetes mellitus with hyperglycemia: Secondary | ICD-10-CM | POA: Diagnosis not present

## 2019-12-11 DIAGNOSIS — I69365 Other paralytic syndrome following cerebral infarction, bilateral: Secondary | ICD-10-CM | POA: Diagnosis not present

## 2019-12-11 DIAGNOSIS — Z9181 History of falling: Secondary | ICD-10-CM | POA: Diagnosis not present

## 2019-12-11 DIAGNOSIS — R000 Tachycardia, unspecified: Secondary | ICD-10-CM | POA: Diagnosis not present

## 2019-12-11 DIAGNOSIS — G4733 Obstructive sleep apnea (adult) (pediatric): Secondary | ICD-10-CM | POA: Diagnosis not present

## 2019-12-11 DIAGNOSIS — G40909 Epilepsy, unspecified, not intractable, without status epilepticus: Secondary | ICD-10-CM | POA: Diagnosis not present

## 2019-12-11 DIAGNOSIS — Z86718 Personal history of other venous thrombosis and embolism: Secondary | ICD-10-CM | POA: Diagnosis not present

## 2019-12-11 DIAGNOSIS — Z6841 Body Mass Index (BMI) 40.0 and over, adult: Secondary | ICD-10-CM | POA: Diagnosis not present

## 2019-12-11 DIAGNOSIS — D649 Anemia, unspecified: Secondary | ICD-10-CM | POA: Diagnosis not present

## 2019-12-11 DIAGNOSIS — R339 Retention of urine, unspecified: Secondary | ICD-10-CM | POA: Diagnosis not present

## 2019-12-12 DIAGNOSIS — Z930 Tracheostomy status: Secondary | ICD-10-CM | POA: Diagnosis not present

## 2019-12-13 DIAGNOSIS — D649 Anemia, unspecified: Secondary | ICD-10-CM | POA: Diagnosis not present

## 2019-12-13 DIAGNOSIS — Z86718 Personal history of other venous thrombosis and embolism: Secondary | ICD-10-CM | POA: Diagnosis not present

## 2019-12-13 DIAGNOSIS — G40909 Epilepsy, unspecified, not intractable, without status epilepticus: Secondary | ICD-10-CM | POA: Diagnosis not present

## 2019-12-13 DIAGNOSIS — R000 Tachycardia, unspecified: Secondary | ICD-10-CM | POA: Diagnosis not present

## 2019-12-13 DIAGNOSIS — G8250 Quadriplegia, unspecified: Secondary | ICD-10-CM | POA: Diagnosis not present

## 2019-12-13 DIAGNOSIS — Z7401 Bed confinement status: Secondary | ICD-10-CM | POA: Diagnosis not present

## 2019-12-13 DIAGNOSIS — I10 Essential (primary) hypertension: Secondary | ICD-10-CM | POA: Diagnosis not present

## 2019-12-13 DIAGNOSIS — Z87440 Personal history of urinary (tract) infections: Secondary | ICD-10-CM | POA: Diagnosis not present

## 2019-12-13 DIAGNOSIS — Z6841 Body Mass Index (BMI) 40.0 and over, adult: Secondary | ICD-10-CM | POA: Diagnosis not present

## 2019-12-13 DIAGNOSIS — E1165 Type 2 diabetes mellitus with hyperglycemia: Secondary | ICD-10-CM | POA: Diagnosis not present

## 2019-12-13 DIAGNOSIS — R339 Retention of urine, unspecified: Secondary | ICD-10-CM | POA: Diagnosis not present

## 2019-12-13 DIAGNOSIS — Z9181 History of falling: Secondary | ICD-10-CM | POA: Diagnosis not present

## 2019-12-13 DIAGNOSIS — I69365 Other paralytic syndrome following cerebral infarction, bilateral: Secondary | ICD-10-CM | POA: Diagnosis not present

## 2019-12-13 DIAGNOSIS — I69320 Aphasia following cerebral infarction: Secondary | ICD-10-CM | POA: Diagnosis not present

## 2019-12-13 DIAGNOSIS — G4733 Obstructive sleep apnea (adult) (pediatric): Secondary | ICD-10-CM | POA: Diagnosis not present

## 2019-12-16 DIAGNOSIS — G40909 Epilepsy, unspecified, not intractable, without status epilepticus: Secondary | ICD-10-CM | POA: Diagnosis not present

## 2019-12-16 DIAGNOSIS — Z9181 History of falling: Secondary | ICD-10-CM | POA: Diagnosis not present

## 2019-12-16 DIAGNOSIS — Z86718 Personal history of other venous thrombosis and embolism: Secondary | ICD-10-CM | POA: Diagnosis not present

## 2019-12-16 DIAGNOSIS — R339 Retention of urine, unspecified: Secondary | ICD-10-CM | POA: Diagnosis not present

## 2019-12-16 DIAGNOSIS — I69365 Other paralytic syndrome following cerebral infarction, bilateral: Secondary | ICD-10-CM | POA: Diagnosis not present

## 2019-12-16 DIAGNOSIS — I69320 Aphasia following cerebral infarction: Secondary | ICD-10-CM | POA: Diagnosis not present

## 2019-12-16 DIAGNOSIS — I6389 Other cerebral infarction: Secondary | ICD-10-CM | POA: Diagnosis not present

## 2019-12-16 DIAGNOSIS — Z6841 Body Mass Index (BMI) 40.0 and over, adult: Secondary | ICD-10-CM | POA: Diagnosis not present

## 2019-12-16 DIAGNOSIS — L8994 Pressure ulcer of unspecified site, stage 4: Secondary | ICD-10-CM | POA: Diagnosis not present

## 2019-12-16 DIAGNOSIS — D649 Anemia, unspecified: Secondary | ICD-10-CM | POA: Diagnosis not present

## 2019-12-16 DIAGNOSIS — G8250 Quadriplegia, unspecified: Secondary | ICD-10-CM | POA: Diagnosis not present

## 2019-12-16 DIAGNOSIS — G4733 Obstructive sleep apnea (adult) (pediatric): Secondary | ICD-10-CM | POA: Diagnosis not present

## 2019-12-16 DIAGNOSIS — Z7401 Bed confinement status: Secondary | ICD-10-CM | POA: Diagnosis not present

## 2019-12-16 DIAGNOSIS — E1165 Type 2 diabetes mellitus with hyperglycemia: Secondary | ICD-10-CM | POA: Diagnosis not present

## 2019-12-16 DIAGNOSIS — Z930 Tracheostomy status: Secondary | ICD-10-CM | POA: Diagnosis not present

## 2019-12-16 DIAGNOSIS — I10 Essential (primary) hypertension: Secondary | ICD-10-CM | POA: Diagnosis not present

## 2019-12-16 DIAGNOSIS — R000 Tachycardia, unspecified: Secondary | ICD-10-CM | POA: Diagnosis not present

## 2019-12-16 DIAGNOSIS — Z87440 Personal history of urinary (tract) infections: Secondary | ICD-10-CM | POA: Diagnosis not present

## 2019-12-17 DIAGNOSIS — I69365 Other paralytic syndrome following cerebral infarction, bilateral: Secondary | ICD-10-CM | POA: Diagnosis not present

## 2019-12-17 DIAGNOSIS — R339 Retention of urine, unspecified: Secondary | ICD-10-CM | POA: Diagnosis not present

## 2019-12-17 DIAGNOSIS — G40909 Epilepsy, unspecified, not intractable, without status epilepticus: Secondary | ICD-10-CM | POA: Diagnosis not present

## 2019-12-17 DIAGNOSIS — E1165 Type 2 diabetes mellitus with hyperglycemia: Secondary | ICD-10-CM | POA: Diagnosis not present

## 2019-12-17 DIAGNOSIS — R000 Tachycardia, unspecified: Secondary | ICD-10-CM | POA: Diagnosis not present

## 2019-12-17 DIAGNOSIS — I69320 Aphasia following cerebral infarction: Secondary | ICD-10-CM | POA: Diagnosis not present

## 2019-12-17 DIAGNOSIS — D649 Anemia, unspecified: Secondary | ICD-10-CM | POA: Diagnosis not present

## 2019-12-17 DIAGNOSIS — Z87440 Personal history of urinary (tract) infections: Secondary | ICD-10-CM | POA: Diagnosis not present

## 2019-12-17 DIAGNOSIS — Z9181 History of falling: Secondary | ICD-10-CM | POA: Diagnosis not present

## 2019-12-17 DIAGNOSIS — G4733 Obstructive sleep apnea (adult) (pediatric): Secondary | ICD-10-CM | POA: Diagnosis not present

## 2019-12-17 DIAGNOSIS — G8250 Quadriplegia, unspecified: Secondary | ICD-10-CM | POA: Diagnosis not present

## 2019-12-17 DIAGNOSIS — Z86718 Personal history of other venous thrombosis and embolism: Secondary | ICD-10-CM | POA: Diagnosis not present

## 2019-12-17 DIAGNOSIS — I10 Essential (primary) hypertension: Secondary | ICD-10-CM | POA: Diagnosis not present

## 2019-12-17 DIAGNOSIS — Z7401 Bed confinement status: Secondary | ICD-10-CM | POA: Diagnosis not present

## 2019-12-17 DIAGNOSIS — Z6841 Body Mass Index (BMI) 40.0 and over, adult: Secondary | ICD-10-CM | POA: Diagnosis not present

## 2019-12-18 DIAGNOSIS — R000 Tachycardia, unspecified: Secondary | ICD-10-CM | POA: Diagnosis not present

## 2019-12-18 DIAGNOSIS — Z86718 Personal history of other venous thrombosis and embolism: Secondary | ICD-10-CM | POA: Diagnosis not present

## 2019-12-18 DIAGNOSIS — I69365 Other paralytic syndrome following cerebral infarction, bilateral: Secondary | ICD-10-CM | POA: Diagnosis not present

## 2019-12-18 DIAGNOSIS — G4733 Obstructive sleep apnea (adult) (pediatric): Secondary | ICD-10-CM | POA: Diagnosis not present

## 2019-12-18 DIAGNOSIS — G8250 Quadriplegia, unspecified: Secondary | ICD-10-CM | POA: Diagnosis not present

## 2019-12-18 DIAGNOSIS — E1165 Type 2 diabetes mellitus with hyperglycemia: Secondary | ICD-10-CM | POA: Diagnosis not present

## 2019-12-18 DIAGNOSIS — Z7401 Bed confinement status: Secondary | ICD-10-CM | POA: Diagnosis not present

## 2019-12-18 DIAGNOSIS — G40909 Epilepsy, unspecified, not intractable, without status epilepticus: Secondary | ICD-10-CM | POA: Diagnosis not present

## 2019-12-18 DIAGNOSIS — Z87440 Personal history of urinary (tract) infections: Secondary | ICD-10-CM | POA: Diagnosis not present

## 2019-12-18 DIAGNOSIS — I69320 Aphasia following cerebral infarction: Secondary | ICD-10-CM | POA: Diagnosis not present

## 2019-12-18 DIAGNOSIS — Z9181 History of falling: Secondary | ICD-10-CM | POA: Diagnosis not present

## 2019-12-18 DIAGNOSIS — D649 Anemia, unspecified: Secondary | ICD-10-CM | POA: Diagnosis not present

## 2019-12-18 DIAGNOSIS — R339 Retention of urine, unspecified: Secondary | ICD-10-CM | POA: Diagnosis not present

## 2019-12-18 DIAGNOSIS — I10 Essential (primary) hypertension: Secondary | ICD-10-CM | POA: Diagnosis not present

## 2019-12-18 DIAGNOSIS — Z6841 Body Mass Index (BMI) 40.0 and over, adult: Secondary | ICD-10-CM | POA: Diagnosis not present

## 2019-12-19 DIAGNOSIS — Z6841 Body Mass Index (BMI) 40.0 and over, adult: Secondary | ICD-10-CM | POA: Diagnosis not present

## 2019-12-19 DIAGNOSIS — I10 Essential (primary) hypertension: Secondary | ICD-10-CM | POA: Diagnosis not present

## 2019-12-19 DIAGNOSIS — Z9181 History of falling: Secondary | ICD-10-CM | POA: Diagnosis not present

## 2019-12-19 DIAGNOSIS — G40909 Epilepsy, unspecified, not intractable, without status epilepticus: Secondary | ICD-10-CM | POA: Diagnosis not present

## 2019-12-19 DIAGNOSIS — Z87440 Personal history of urinary (tract) infections: Secondary | ICD-10-CM | POA: Diagnosis not present

## 2019-12-19 DIAGNOSIS — D649 Anemia, unspecified: Secondary | ICD-10-CM | POA: Diagnosis not present

## 2019-12-19 DIAGNOSIS — Z7401 Bed confinement status: Secondary | ICD-10-CM | POA: Diagnosis not present

## 2019-12-19 DIAGNOSIS — R339 Retention of urine, unspecified: Secondary | ICD-10-CM | POA: Diagnosis not present

## 2019-12-19 DIAGNOSIS — R000 Tachycardia, unspecified: Secondary | ICD-10-CM | POA: Diagnosis not present

## 2019-12-19 DIAGNOSIS — G8250 Quadriplegia, unspecified: Secondary | ICD-10-CM | POA: Diagnosis not present

## 2019-12-19 DIAGNOSIS — Z86718 Personal history of other venous thrombosis and embolism: Secondary | ICD-10-CM | POA: Diagnosis not present

## 2019-12-19 DIAGNOSIS — E1165 Type 2 diabetes mellitus with hyperglycemia: Secondary | ICD-10-CM | POA: Diagnosis not present

## 2019-12-19 DIAGNOSIS — I69365 Other paralytic syndrome following cerebral infarction, bilateral: Secondary | ICD-10-CM | POA: Diagnosis not present

## 2019-12-19 DIAGNOSIS — I69320 Aphasia following cerebral infarction: Secondary | ICD-10-CM | POA: Diagnosis not present

## 2019-12-19 DIAGNOSIS — G4733 Obstructive sleep apnea (adult) (pediatric): Secondary | ICD-10-CM | POA: Diagnosis not present

## 2019-12-24 DIAGNOSIS — Z9181 History of falling: Secondary | ICD-10-CM | POA: Diagnosis not present

## 2019-12-24 DIAGNOSIS — G8250 Quadriplegia, unspecified: Secondary | ICD-10-CM | POA: Diagnosis not present

## 2019-12-24 DIAGNOSIS — I10 Essential (primary) hypertension: Secondary | ICD-10-CM | POA: Diagnosis not present

## 2019-12-24 DIAGNOSIS — R000 Tachycardia, unspecified: Secondary | ICD-10-CM | POA: Diagnosis not present

## 2019-12-24 DIAGNOSIS — D649 Anemia, unspecified: Secondary | ICD-10-CM | POA: Diagnosis not present

## 2019-12-24 DIAGNOSIS — E1165 Type 2 diabetes mellitus with hyperglycemia: Secondary | ICD-10-CM | POA: Diagnosis not present

## 2019-12-24 DIAGNOSIS — G40909 Epilepsy, unspecified, not intractable, without status epilepticus: Secondary | ICD-10-CM | POA: Diagnosis not present

## 2019-12-24 DIAGNOSIS — I69320 Aphasia following cerebral infarction: Secondary | ICD-10-CM | POA: Diagnosis not present

## 2019-12-24 DIAGNOSIS — Z6841 Body Mass Index (BMI) 40.0 and over, adult: Secondary | ICD-10-CM | POA: Diagnosis not present

## 2019-12-24 DIAGNOSIS — I69365 Other paralytic syndrome following cerebral infarction, bilateral: Secondary | ICD-10-CM | POA: Diagnosis not present

## 2019-12-24 DIAGNOSIS — Z7401 Bed confinement status: Secondary | ICD-10-CM | POA: Diagnosis not present

## 2019-12-24 DIAGNOSIS — G4733 Obstructive sleep apnea (adult) (pediatric): Secondary | ICD-10-CM | POA: Diagnosis not present

## 2019-12-24 DIAGNOSIS — Z87440 Personal history of urinary (tract) infections: Secondary | ICD-10-CM | POA: Diagnosis not present

## 2019-12-24 DIAGNOSIS — R339 Retention of urine, unspecified: Secondary | ICD-10-CM | POA: Diagnosis not present

## 2019-12-24 DIAGNOSIS — Z86718 Personal history of other venous thrombosis and embolism: Secondary | ICD-10-CM | POA: Diagnosis not present

## 2019-12-25 DIAGNOSIS — D649 Anemia, unspecified: Secondary | ICD-10-CM | POA: Diagnosis not present

## 2019-12-25 DIAGNOSIS — Z9181 History of falling: Secondary | ICD-10-CM | POA: Diagnosis not present

## 2019-12-25 DIAGNOSIS — Z86718 Personal history of other venous thrombosis and embolism: Secondary | ICD-10-CM | POA: Diagnosis not present

## 2019-12-25 DIAGNOSIS — E1165 Type 2 diabetes mellitus with hyperglycemia: Secondary | ICD-10-CM | POA: Diagnosis not present

## 2019-12-25 DIAGNOSIS — Z7401 Bed confinement status: Secondary | ICD-10-CM | POA: Diagnosis not present

## 2019-12-25 DIAGNOSIS — G4733 Obstructive sleep apnea (adult) (pediatric): Secondary | ICD-10-CM | POA: Diagnosis not present

## 2019-12-25 DIAGNOSIS — G40909 Epilepsy, unspecified, not intractable, without status epilepticus: Secondary | ICD-10-CM | POA: Diagnosis not present

## 2019-12-25 DIAGNOSIS — R000 Tachycardia, unspecified: Secondary | ICD-10-CM | POA: Diagnosis not present

## 2019-12-25 DIAGNOSIS — I69365 Other paralytic syndrome following cerebral infarction, bilateral: Secondary | ICD-10-CM | POA: Diagnosis not present

## 2019-12-25 DIAGNOSIS — Z6841 Body Mass Index (BMI) 40.0 and over, adult: Secondary | ICD-10-CM | POA: Diagnosis not present

## 2019-12-25 DIAGNOSIS — R339 Retention of urine, unspecified: Secondary | ICD-10-CM | POA: Diagnosis not present

## 2019-12-25 DIAGNOSIS — G8250 Quadriplegia, unspecified: Secondary | ICD-10-CM | POA: Diagnosis not present

## 2019-12-25 DIAGNOSIS — I10 Essential (primary) hypertension: Secondary | ICD-10-CM | POA: Diagnosis not present

## 2019-12-25 DIAGNOSIS — I69320 Aphasia following cerebral infarction: Secondary | ICD-10-CM | POA: Diagnosis not present

## 2019-12-25 DIAGNOSIS — Z87440 Personal history of urinary (tract) infections: Secondary | ICD-10-CM | POA: Diagnosis not present

## 2019-12-26 DIAGNOSIS — D649 Anemia, unspecified: Secondary | ICD-10-CM | POA: Diagnosis not present

## 2019-12-26 DIAGNOSIS — I69365 Other paralytic syndrome following cerebral infarction, bilateral: Secondary | ICD-10-CM | POA: Diagnosis not present

## 2019-12-26 DIAGNOSIS — Z86718 Personal history of other venous thrombosis and embolism: Secondary | ICD-10-CM | POA: Diagnosis not present

## 2019-12-26 DIAGNOSIS — I69320 Aphasia following cerebral infarction: Secondary | ICD-10-CM | POA: Diagnosis not present

## 2019-12-26 DIAGNOSIS — Z7401 Bed confinement status: Secondary | ICD-10-CM | POA: Diagnosis not present

## 2019-12-26 DIAGNOSIS — Z87440 Personal history of urinary (tract) infections: Secondary | ICD-10-CM | POA: Diagnosis not present

## 2019-12-26 DIAGNOSIS — Z6841 Body Mass Index (BMI) 40.0 and over, adult: Secondary | ICD-10-CM | POA: Diagnosis not present

## 2019-12-26 DIAGNOSIS — G4733 Obstructive sleep apnea (adult) (pediatric): Secondary | ICD-10-CM | POA: Diagnosis not present

## 2019-12-26 DIAGNOSIS — Z9181 History of falling: Secondary | ICD-10-CM | POA: Diagnosis not present

## 2019-12-26 DIAGNOSIS — R000 Tachycardia, unspecified: Secondary | ICD-10-CM | POA: Diagnosis not present

## 2019-12-26 DIAGNOSIS — G40909 Epilepsy, unspecified, not intractable, without status epilepticus: Secondary | ICD-10-CM | POA: Diagnosis not present

## 2019-12-26 DIAGNOSIS — R339 Retention of urine, unspecified: Secondary | ICD-10-CM | POA: Diagnosis not present

## 2019-12-26 DIAGNOSIS — G8250 Quadriplegia, unspecified: Secondary | ICD-10-CM | POA: Diagnosis not present

## 2019-12-26 DIAGNOSIS — I10 Essential (primary) hypertension: Secondary | ICD-10-CM | POA: Diagnosis not present

## 2019-12-26 DIAGNOSIS — E1165 Type 2 diabetes mellitus with hyperglycemia: Secondary | ICD-10-CM | POA: Diagnosis not present

## 2019-12-27 DIAGNOSIS — Z9181 History of falling: Secondary | ICD-10-CM | POA: Diagnosis not present

## 2019-12-27 DIAGNOSIS — R000 Tachycardia, unspecified: Secondary | ICD-10-CM | POA: Diagnosis not present

## 2019-12-27 DIAGNOSIS — I10 Essential (primary) hypertension: Secondary | ICD-10-CM | POA: Diagnosis not present

## 2019-12-27 DIAGNOSIS — G4733 Obstructive sleep apnea (adult) (pediatric): Secondary | ICD-10-CM | POA: Diagnosis not present

## 2019-12-27 DIAGNOSIS — R339 Retention of urine, unspecified: Secondary | ICD-10-CM | POA: Diagnosis not present

## 2019-12-27 DIAGNOSIS — Z6841 Body Mass Index (BMI) 40.0 and over, adult: Secondary | ICD-10-CM | POA: Diagnosis not present

## 2019-12-27 DIAGNOSIS — Z86718 Personal history of other venous thrombosis and embolism: Secondary | ICD-10-CM | POA: Diagnosis not present

## 2019-12-27 DIAGNOSIS — D649 Anemia, unspecified: Secondary | ICD-10-CM | POA: Diagnosis not present

## 2019-12-27 DIAGNOSIS — I69365 Other paralytic syndrome following cerebral infarction, bilateral: Secondary | ICD-10-CM | POA: Diagnosis not present

## 2019-12-27 DIAGNOSIS — G40909 Epilepsy, unspecified, not intractable, without status epilepticus: Secondary | ICD-10-CM | POA: Diagnosis not present

## 2019-12-27 DIAGNOSIS — Z87440 Personal history of urinary (tract) infections: Secondary | ICD-10-CM | POA: Diagnosis not present

## 2019-12-27 DIAGNOSIS — I69320 Aphasia following cerebral infarction: Secondary | ICD-10-CM | POA: Diagnosis not present

## 2019-12-27 DIAGNOSIS — E1165 Type 2 diabetes mellitus with hyperglycemia: Secondary | ICD-10-CM | POA: Diagnosis not present

## 2019-12-27 DIAGNOSIS — G8250 Quadriplegia, unspecified: Secondary | ICD-10-CM | POA: Diagnosis not present

## 2019-12-27 DIAGNOSIS — Z7401 Bed confinement status: Secondary | ICD-10-CM | POA: Diagnosis not present

## 2019-12-30 DIAGNOSIS — T8189XA Other complications of procedures, not elsewhere classified, initial encounter: Secondary | ICD-10-CM | POA: Diagnosis not present

## 2019-12-31 DIAGNOSIS — I6389 Other cerebral infarction: Secondary | ICD-10-CM | POA: Diagnosis not present

## 2019-12-31 DIAGNOSIS — G4733 Obstructive sleep apnea (adult) (pediatric): Secondary | ICD-10-CM | POA: Diagnosis not present

## 2019-12-31 DIAGNOSIS — L8994 Pressure ulcer of unspecified site, stage 4: Secondary | ICD-10-CM | POA: Diagnosis not present

## 2020-01-01 DIAGNOSIS — Z87440 Personal history of urinary (tract) infections: Secondary | ICD-10-CM | POA: Diagnosis not present

## 2020-01-01 DIAGNOSIS — Z9181 History of falling: Secondary | ICD-10-CM | POA: Diagnosis not present

## 2020-01-01 DIAGNOSIS — R339 Retention of urine, unspecified: Secondary | ICD-10-CM | POA: Diagnosis not present

## 2020-01-01 DIAGNOSIS — G8250 Quadriplegia, unspecified: Secondary | ICD-10-CM | POA: Diagnosis not present

## 2020-01-01 DIAGNOSIS — G40909 Epilepsy, unspecified, not intractable, without status epilepticus: Secondary | ICD-10-CM | POA: Diagnosis not present

## 2020-01-01 DIAGNOSIS — I69365 Other paralytic syndrome following cerebral infarction, bilateral: Secondary | ICD-10-CM | POA: Diagnosis not present

## 2020-01-01 DIAGNOSIS — Z86718 Personal history of other venous thrombosis and embolism: Secondary | ICD-10-CM | POA: Diagnosis not present

## 2020-01-01 DIAGNOSIS — R000 Tachycardia, unspecified: Secondary | ICD-10-CM | POA: Diagnosis not present

## 2020-01-01 DIAGNOSIS — Z6841 Body Mass Index (BMI) 40.0 and over, adult: Secondary | ICD-10-CM | POA: Diagnosis not present

## 2020-01-01 DIAGNOSIS — Z7401 Bed confinement status: Secondary | ICD-10-CM | POA: Diagnosis not present

## 2020-01-01 DIAGNOSIS — G4733 Obstructive sleep apnea (adult) (pediatric): Secondary | ICD-10-CM | POA: Diagnosis not present

## 2020-01-01 DIAGNOSIS — I69320 Aphasia following cerebral infarction: Secondary | ICD-10-CM | POA: Diagnosis not present

## 2020-01-01 DIAGNOSIS — I10 Essential (primary) hypertension: Secondary | ICD-10-CM | POA: Diagnosis not present

## 2020-01-01 DIAGNOSIS — E1165 Type 2 diabetes mellitus with hyperglycemia: Secondary | ICD-10-CM | POA: Diagnosis not present

## 2020-01-01 DIAGNOSIS — D649 Anemia, unspecified: Secondary | ICD-10-CM | POA: Diagnosis not present

## 2020-01-03 DIAGNOSIS — D649 Anemia, unspecified: Secondary | ICD-10-CM | POA: Diagnosis not present

## 2020-01-03 DIAGNOSIS — G4733 Obstructive sleep apnea (adult) (pediatric): Secondary | ICD-10-CM | POA: Diagnosis not present

## 2020-01-03 DIAGNOSIS — R339 Retention of urine, unspecified: Secondary | ICD-10-CM | POA: Diagnosis not present

## 2020-01-03 DIAGNOSIS — Z6841 Body Mass Index (BMI) 40.0 and over, adult: Secondary | ICD-10-CM | POA: Diagnosis not present

## 2020-01-03 DIAGNOSIS — E1165 Type 2 diabetes mellitus with hyperglycemia: Secondary | ICD-10-CM | POA: Diagnosis not present

## 2020-01-03 DIAGNOSIS — I69320 Aphasia following cerebral infarction: Secondary | ICD-10-CM | POA: Diagnosis not present

## 2020-01-03 DIAGNOSIS — Z87440 Personal history of urinary (tract) infections: Secondary | ICD-10-CM | POA: Diagnosis not present

## 2020-01-03 DIAGNOSIS — G8250 Quadriplegia, unspecified: Secondary | ICD-10-CM | POA: Diagnosis not present

## 2020-01-03 DIAGNOSIS — Z7401 Bed confinement status: Secondary | ICD-10-CM | POA: Diagnosis not present

## 2020-01-03 DIAGNOSIS — R000 Tachycardia, unspecified: Secondary | ICD-10-CM | POA: Diagnosis not present

## 2020-01-03 DIAGNOSIS — G40909 Epilepsy, unspecified, not intractable, without status epilepticus: Secondary | ICD-10-CM | POA: Diagnosis not present

## 2020-01-03 DIAGNOSIS — Z9181 History of falling: Secondary | ICD-10-CM | POA: Diagnosis not present

## 2020-01-03 DIAGNOSIS — I69365 Other paralytic syndrome following cerebral infarction, bilateral: Secondary | ICD-10-CM | POA: Diagnosis not present

## 2020-01-03 DIAGNOSIS — Z86718 Personal history of other venous thrombosis and embolism: Secondary | ICD-10-CM | POA: Diagnosis not present

## 2020-01-03 DIAGNOSIS — I10 Essential (primary) hypertension: Secondary | ICD-10-CM | POA: Diagnosis not present

## 2020-01-06 DIAGNOSIS — Z6841 Body Mass Index (BMI) 40.0 and over, adult: Secondary | ICD-10-CM | POA: Diagnosis not present

## 2020-01-06 DIAGNOSIS — Z9181 History of falling: Secondary | ICD-10-CM | POA: Diagnosis not present

## 2020-01-06 DIAGNOSIS — D649 Anemia, unspecified: Secondary | ICD-10-CM | POA: Diagnosis not present

## 2020-01-06 DIAGNOSIS — Z7401 Bed confinement status: Secondary | ICD-10-CM | POA: Diagnosis not present

## 2020-01-06 DIAGNOSIS — R000 Tachycardia, unspecified: Secondary | ICD-10-CM | POA: Diagnosis not present

## 2020-01-06 DIAGNOSIS — I69320 Aphasia following cerebral infarction: Secondary | ICD-10-CM | POA: Diagnosis not present

## 2020-01-06 DIAGNOSIS — Z87440 Personal history of urinary (tract) infections: Secondary | ICD-10-CM | POA: Diagnosis not present

## 2020-01-06 DIAGNOSIS — G40909 Epilepsy, unspecified, not intractable, without status epilepticus: Secondary | ICD-10-CM | POA: Diagnosis not present

## 2020-01-06 DIAGNOSIS — I10 Essential (primary) hypertension: Secondary | ICD-10-CM | POA: Diagnosis not present

## 2020-01-06 DIAGNOSIS — G4733 Obstructive sleep apnea (adult) (pediatric): Secondary | ICD-10-CM | POA: Diagnosis not present

## 2020-01-06 DIAGNOSIS — G8250 Quadriplegia, unspecified: Secondary | ICD-10-CM | POA: Diagnosis not present

## 2020-01-06 DIAGNOSIS — R339 Retention of urine, unspecified: Secondary | ICD-10-CM | POA: Diagnosis not present

## 2020-01-06 DIAGNOSIS — Z86718 Personal history of other venous thrombosis and embolism: Secondary | ICD-10-CM | POA: Diagnosis not present

## 2020-01-06 DIAGNOSIS — I69365 Other paralytic syndrome following cerebral infarction, bilateral: Secondary | ICD-10-CM | POA: Diagnosis not present

## 2020-01-06 DIAGNOSIS — E1165 Type 2 diabetes mellitus with hyperglycemia: Secondary | ICD-10-CM | POA: Diagnosis not present

## 2020-01-07 DIAGNOSIS — R339 Retention of urine, unspecified: Secondary | ICD-10-CM | POA: Diagnosis not present

## 2020-01-07 DIAGNOSIS — Z9181 History of falling: Secondary | ICD-10-CM | POA: Diagnosis not present

## 2020-01-07 DIAGNOSIS — D649 Anemia, unspecified: Secondary | ICD-10-CM | POA: Diagnosis not present

## 2020-01-07 DIAGNOSIS — Z87440 Personal history of urinary (tract) infections: Secondary | ICD-10-CM | POA: Diagnosis not present

## 2020-01-07 DIAGNOSIS — I69365 Other paralytic syndrome following cerebral infarction, bilateral: Secondary | ICD-10-CM | POA: Diagnosis not present

## 2020-01-07 DIAGNOSIS — G8250 Quadriplegia, unspecified: Secondary | ICD-10-CM | POA: Diagnosis not present

## 2020-01-07 DIAGNOSIS — I10 Essential (primary) hypertension: Secondary | ICD-10-CM | POA: Diagnosis not present

## 2020-01-07 DIAGNOSIS — Z7401 Bed confinement status: Secondary | ICD-10-CM | POA: Diagnosis not present

## 2020-01-07 DIAGNOSIS — R000 Tachycardia, unspecified: Secondary | ICD-10-CM | POA: Diagnosis not present

## 2020-01-07 DIAGNOSIS — Z86718 Personal history of other venous thrombosis and embolism: Secondary | ICD-10-CM | POA: Diagnosis not present

## 2020-01-07 DIAGNOSIS — E1165 Type 2 diabetes mellitus with hyperglycemia: Secondary | ICD-10-CM | POA: Diagnosis not present

## 2020-01-07 DIAGNOSIS — G4733 Obstructive sleep apnea (adult) (pediatric): Secondary | ICD-10-CM | POA: Diagnosis not present

## 2020-01-07 DIAGNOSIS — L89154 Pressure ulcer of sacral region, stage 4: Secondary | ICD-10-CM | POA: Diagnosis not present

## 2020-01-07 DIAGNOSIS — I69320 Aphasia following cerebral infarction: Secondary | ICD-10-CM | POA: Diagnosis not present

## 2020-01-07 DIAGNOSIS — G40909 Epilepsy, unspecified, not intractable, without status epilepticus: Secondary | ICD-10-CM | POA: Diagnosis not present

## 2020-01-07 DIAGNOSIS — Z6841 Body Mass Index (BMI) 40.0 and over, adult: Secondary | ICD-10-CM | POA: Diagnosis not present

## 2020-01-07 DIAGNOSIS — T8189XA Other complications of procedures, not elsewhere classified, initial encounter: Secondary | ICD-10-CM | POA: Diagnosis not present

## 2020-01-08 DIAGNOSIS — I10 Essential (primary) hypertension: Secondary | ICD-10-CM | POA: Diagnosis not present

## 2020-01-08 DIAGNOSIS — R339 Retention of urine, unspecified: Secondary | ICD-10-CM | POA: Diagnosis not present

## 2020-01-08 DIAGNOSIS — R000 Tachycardia, unspecified: Secondary | ICD-10-CM | POA: Diagnosis not present

## 2020-01-08 DIAGNOSIS — G40909 Epilepsy, unspecified, not intractable, without status epilepticus: Secondary | ICD-10-CM | POA: Diagnosis not present

## 2020-01-08 DIAGNOSIS — Z7401 Bed confinement status: Secondary | ICD-10-CM | POA: Diagnosis not present

## 2020-01-08 DIAGNOSIS — Z86718 Personal history of other venous thrombosis and embolism: Secondary | ICD-10-CM | POA: Diagnosis not present

## 2020-01-08 DIAGNOSIS — Z6841 Body Mass Index (BMI) 40.0 and over, adult: Secondary | ICD-10-CM | POA: Diagnosis not present

## 2020-01-08 DIAGNOSIS — E1165 Type 2 diabetes mellitus with hyperglycemia: Secondary | ICD-10-CM | POA: Diagnosis not present

## 2020-01-08 DIAGNOSIS — Z9181 History of falling: Secondary | ICD-10-CM | POA: Diagnosis not present

## 2020-01-08 DIAGNOSIS — G8250 Quadriplegia, unspecified: Secondary | ICD-10-CM | POA: Diagnosis not present

## 2020-01-08 DIAGNOSIS — I69320 Aphasia following cerebral infarction: Secondary | ICD-10-CM | POA: Diagnosis not present

## 2020-01-08 DIAGNOSIS — G4733 Obstructive sleep apnea (adult) (pediatric): Secondary | ICD-10-CM | POA: Diagnosis not present

## 2020-01-08 DIAGNOSIS — D649 Anemia, unspecified: Secondary | ICD-10-CM | POA: Diagnosis not present

## 2020-01-08 DIAGNOSIS — Z87440 Personal history of urinary (tract) infections: Secondary | ICD-10-CM | POA: Diagnosis not present

## 2020-01-08 DIAGNOSIS — I69365 Other paralytic syndrome following cerebral infarction, bilateral: Secondary | ICD-10-CM | POA: Diagnosis not present

## 2020-01-09 DIAGNOSIS — Z86718 Personal history of other venous thrombosis and embolism: Secondary | ICD-10-CM | POA: Diagnosis not present

## 2020-01-09 DIAGNOSIS — I69365 Other paralytic syndrome following cerebral infarction, bilateral: Secondary | ICD-10-CM | POA: Diagnosis not present

## 2020-01-09 DIAGNOSIS — L8994 Pressure ulcer of unspecified site, stage 4: Secondary | ICD-10-CM | POA: Diagnosis not present

## 2020-01-09 DIAGNOSIS — Z87440 Personal history of urinary (tract) infections: Secondary | ICD-10-CM | POA: Diagnosis not present

## 2020-01-09 DIAGNOSIS — E1165 Type 2 diabetes mellitus with hyperglycemia: Secondary | ICD-10-CM | POA: Diagnosis not present

## 2020-01-09 DIAGNOSIS — G40909 Epilepsy, unspecified, not intractable, without status epilepticus: Secondary | ICD-10-CM | POA: Diagnosis not present

## 2020-01-09 DIAGNOSIS — I6389 Other cerebral infarction: Secondary | ICD-10-CM | POA: Diagnosis not present

## 2020-01-09 DIAGNOSIS — Z9181 History of falling: Secondary | ICD-10-CM | POA: Diagnosis not present

## 2020-01-09 DIAGNOSIS — R339 Retention of urine, unspecified: Secondary | ICD-10-CM | POA: Diagnosis not present

## 2020-01-09 DIAGNOSIS — Z7401 Bed confinement status: Secondary | ICD-10-CM | POA: Diagnosis not present

## 2020-01-09 DIAGNOSIS — G4733 Obstructive sleep apnea (adult) (pediatric): Secondary | ICD-10-CM | POA: Diagnosis not present

## 2020-01-09 DIAGNOSIS — I10 Essential (primary) hypertension: Secondary | ICD-10-CM | POA: Diagnosis not present

## 2020-01-09 DIAGNOSIS — Z6841 Body Mass Index (BMI) 40.0 and over, adult: Secondary | ICD-10-CM | POA: Diagnosis not present

## 2020-01-09 DIAGNOSIS — R000 Tachycardia, unspecified: Secondary | ICD-10-CM | POA: Diagnosis not present

## 2020-01-09 DIAGNOSIS — I69320 Aphasia following cerebral infarction: Secondary | ICD-10-CM | POA: Diagnosis not present

## 2020-01-09 DIAGNOSIS — D649 Anemia, unspecified: Secondary | ICD-10-CM | POA: Diagnosis not present

## 2020-01-09 DIAGNOSIS — G8250 Quadriplegia, unspecified: Secondary | ICD-10-CM | POA: Diagnosis not present

## 2020-01-10 DIAGNOSIS — I6389 Other cerebral infarction: Secondary | ICD-10-CM | POA: Diagnosis not present

## 2020-01-10 DIAGNOSIS — L8994 Pressure ulcer of unspecified site, stage 4: Secondary | ICD-10-CM | POA: Diagnosis not present

## 2020-01-10 DIAGNOSIS — G4733 Obstructive sleep apnea (adult) (pediatric): Secondary | ICD-10-CM | POA: Diagnosis not present

## 2020-01-11 DIAGNOSIS — Z930 Tracheostomy status: Secondary | ICD-10-CM | POA: Diagnosis not present

## 2020-01-13 DIAGNOSIS — I69365 Other paralytic syndrome following cerebral infarction, bilateral: Secondary | ICD-10-CM | POA: Diagnosis not present

## 2020-01-13 DIAGNOSIS — G40909 Epilepsy, unspecified, not intractable, without status epilepticus: Secondary | ICD-10-CM | POA: Diagnosis not present

## 2020-01-13 DIAGNOSIS — Z7401 Bed confinement status: Secondary | ICD-10-CM | POA: Diagnosis not present

## 2020-01-13 DIAGNOSIS — Z6841 Body Mass Index (BMI) 40.0 and over, adult: Secondary | ICD-10-CM | POA: Diagnosis not present

## 2020-01-13 DIAGNOSIS — G8250 Quadriplegia, unspecified: Secondary | ICD-10-CM | POA: Diagnosis not present

## 2020-01-13 DIAGNOSIS — I10 Essential (primary) hypertension: Secondary | ICD-10-CM | POA: Diagnosis not present

## 2020-01-13 DIAGNOSIS — Z87440 Personal history of urinary (tract) infections: Secondary | ICD-10-CM | POA: Diagnosis not present

## 2020-01-13 DIAGNOSIS — D649 Anemia, unspecified: Secondary | ICD-10-CM | POA: Diagnosis not present

## 2020-01-13 DIAGNOSIS — I69320 Aphasia following cerebral infarction: Secondary | ICD-10-CM | POA: Diagnosis not present

## 2020-01-13 DIAGNOSIS — R000 Tachycardia, unspecified: Secondary | ICD-10-CM | POA: Diagnosis not present

## 2020-01-13 DIAGNOSIS — Z9181 History of falling: Secondary | ICD-10-CM | POA: Diagnosis not present

## 2020-01-13 DIAGNOSIS — G4733 Obstructive sleep apnea (adult) (pediatric): Secondary | ICD-10-CM | POA: Diagnosis not present

## 2020-01-13 DIAGNOSIS — E1165 Type 2 diabetes mellitus with hyperglycemia: Secondary | ICD-10-CM | POA: Diagnosis not present

## 2020-01-13 DIAGNOSIS — R339 Retention of urine, unspecified: Secondary | ICD-10-CM | POA: Diagnosis not present

## 2020-01-13 DIAGNOSIS — Z86718 Personal history of other venous thrombosis and embolism: Secondary | ICD-10-CM | POA: Diagnosis not present

## 2020-01-14 DIAGNOSIS — I6389 Other cerebral infarction: Secondary | ICD-10-CM | POA: Diagnosis not present

## 2020-01-14 DIAGNOSIS — I10 Essential (primary) hypertension: Secondary | ICD-10-CM | POA: Diagnosis not present

## 2020-01-14 DIAGNOSIS — G8250 Quadriplegia, unspecified: Secondary | ICD-10-CM | POA: Diagnosis not present

## 2020-01-14 DIAGNOSIS — R000 Tachycardia, unspecified: Secondary | ICD-10-CM | POA: Diagnosis not present

## 2020-01-14 DIAGNOSIS — E1165 Type 2 diabetes mellitus with hyperglycemia: Secondary | ICD-10-CM | POA: Diagnosis not present

## 2020-01-14 DIAGNOSIS — R339 Retention of urine, unspecified: Secondary | ICD-10-CM | POA: Diagnosis not present

## 2020-01-14 DIAGNOSIS — G40909 Epilepsy, unspecified, not intractable, without status epilepticus: Secondary | ICD-10-CM | POA: Diagnosis not present

## 2020-01-14 DIAGNOSIS — I69320 Aphasia following cerebral infarction: Secondary | ICD-10-CM | POA: Diagnosis not present

## 2020-01-14 DIAGNOSIS — L8994 Pressure ulcer of unspecified site, stage 4: Secondary | ICD-10-CM | POA: Diagnosis not present

## 2020-01-14 DIAGNOSIS — G4733 Obstructive sleep apnea (adult) (pediatric): Secondary | ICD-10-CM | POA: Diagnosis not present

## 2020-01-14 DIAGNOSIS — Z87440 Personal history of urinary (tract) infections: Secondary | ICD-10-CM | POA: Diagnosis not present

## 2020-01-14 DIAGNOSIS — Z86718 Personal history of other venous thrombosis and embolism: Secondary | ICD-10-CM | POA: Diagnosis not present

## 2020-01-14 DIAGNOSIS — Z9181 History of falling: Secondary | ICD-10-CM | POA: Diagnosis not present

## 2020-01-14 DIAGNOSIS — D649 Anemia, unspecified: Secondary | ICD-10-CM | POA: Diagnosis not present

## 2020-01-14 DIAGNOSIS — I69365 Other paralytic syndrome following cerebral infarction, bilateral: Secondary | ICD-10-CM | POA: Diagnosis not present

## 2020-01-14 DIAGNOSIS — Z6841 Body Mass Index (BMI) 40.0 and over, adult: Secondary | ICD-10-CM | POA: Diagnosis not present

## 2020-01-14 DIAGNOSIS — Z7401 Bed confinement status: Secondary | ICD-10-CM | POA: Diagnosis not present

## 2020-01-15 DIAGNOSIS — G4733 Obstructive sleep apnea (adult) (pediatric): Secondary | ICD-10-CM | POA: Diagnosis not present

## 2020-01-15 DIAGNOSIS — R000 Tachycardia, unspecified: Secondary | ICD-10-CM | POA: Diagnosis not present

## 2020-01-15 DIAGNOSIS — I69320 Aphasia following cerebral infarction: Secondary | ICD-10-CM | POA: Diagnosis not present

## 2020-01-15 DIAGNOSIS — E1165 Type 2 diabetes mellitus with hyperglycemia: Secondary | ICD-10-CM | POA: Diagnosis not present

## 2020-01-15 DIAGNOSIS — Z86718 Personal history of other venous thrombosis and embolism: Secondary | ICD-10-CM | POA: Diagnosis not present

## 2020-01-15 DIAGNOSIS — I10 Essential (primary) hypertension: Secondary | ICD-10-CM | POA: Diagnosis not present

## 2020-01-15 DIAGNOSIS — Z87440 Personal history of urinary (tract) infections: Secondary | ICD-10-CM | POA: Diagnosis not present

## 2020-01-15 DIAGNOSIS — G40909 Epilepsy, unspecified, not intractable, without status epilepticus: Secondary | ICD-10-CM | POA: Diagnosis not present

## 2020-01-15 DIAGNOSIS — Z6841 Body Mass Index (BMI) 40.0 and over, adult: Secondary | ICD-10-CM | POA: Diagnosis not present

## 2020-01-15 DIAGNOSIS — R339 Retention of urine, unspecified: Secondary | ICD-10-CM | POA: Diagnosis not present

## 2020-01-15 DIAGNOSIS — Z7401 Bed confinement status: Secondary | ICD-10-CM | POA: Diagnosis not present

## 2020-01-15 DIAGNOSIS — Z9181 History of falling: Secondary | ICD-10-CM | POA: Diagnosis not present

## 2020-01-15 DIAGNOSIS — I69365 Other paralytic syndrome following cerebral infarction, bilateral: Secondary | ICD-10-CM | POA: Diagnosis not present

## 2020-01-15 DIAGNOSIS — D649 Anemia, unspecified: Secondary | ICD-10-CM | POA: Diagnosis not present

## 2020-01-15 DIAGNOSIS — G8250 Quadriplegia, unspecified: Secondary | ICD-10-CM | POA: Diagnosis not present

## 2020-01-16 DIAGNOSIS — Z87440 Personal history of urinary (tract) infections: Secondary | ICD-10-CM | POA: Diagnosis not present

## 2020-01-16 DIAGNOSIS — D649 Anemia, unspecified: Secondary | ICD-10-CM | POA: Diagnosis not present

## 2020-01-16 DIAGNOSIS — I69320 Aphasia following cerebral infarction: Secondary | ICD-10-CM | POA: Diagnosis not present

## 2020-01-16 DIAGNOSIS — Z86718 Personal history of other venous thrombosis and embolism: Secondary | ICD-10-CM | POA: Diagnosis not present

## 2020-01-16 DIAGNOSIS — Z6841 Body Mass Index (BMI) 40.0 and over, adult: Secondary | ICD-10-CM | POA: Diagnosis not present

## 2020-01-16 DIAGNOSIS — Z9181 History of falling: Secondary | ICD-10-CM | POA: Diagnosis not present

## 2020-01-16 DIAGNOSIS — G40909 Epilepsy, unspecified, not intractable, without status epilepticus: Secondary | ICD-10-CM | POA: Diagnosis not present

## 2020-01-16 DIAGNOSIS — I10 Essential (primary) hypertension: Secondary | ICD-10-CM | POA: Diagnosis not present

## 2020-01-16 DIAGNOSIS — Z7401 Bed confinement status: Secondary | ICD-10-CM | POA: Diagnosis not present

## 2020-01-16 DIAGNOSIS — R000 Tachycardia, unspecified: Secondary | ICD-10-CM | POA: Diagnosis not present

## 2020-01-16 DIAGNOSIS — G8250 Quadriplegia, unspecified: Secondary | ICD-10-CM | POA: Diagnosis not present

## 2020-01-16 DIAGNOSIS — G4733 Obstructive sleep apnea (adult) (pediatric): Secondary | ICD-10-CM | POA: Diagnosis not present

## 2020-01-16 DIAGNOSIS — R339 Retention of urine, unspecified: Secondary | ICD-10-CM | POA: Diagnosis not present

## 2020-01-16 DIAGNOSIS — I69365 Other paralytic syndrome following cerebral infarction, bilateral: Secondary | ICD-10-CM | POA: Diagnosis not present

## 2020-01-16 DIAGNOSIS — E1165 Type 2 diabetes mellitus with hyperglycemia: Secondary | ICD-10-CM | POA: Diagnosis not present

## 2020-01-17 DIAGNOSIS — Z87440 Personal history of urinary (tract) infections: Secondary | ICD-10-CM | POA: Diagnosis not present

## 2020-01-17 DIAGNOSIS — G40909 Epilepsy, unspecified, not intractable, without status epilepticus: Secondary | ICD-10-CM | POA: Diagnosis not present

## 2020-01-17 DIAGNOSIS — I10 Essential (primary) hypertension: Secondary | ICD-10-CM | POA: Diagnosis not present

## 2020-01-17 DIAGNOSIS — R339 Retention of urine, unspecified: Secondary | ICD-10-CM | POA: Diagnosis not present

## 2020-01-17 DIAGNOSIS — D649 Anemia, unspecified: Secondary | ICD-10-CM | POA: Diagnosis not present

## 2020-01-17 DIAGNOSIS — I69320 Aphasia following cerebral infarction: Secondary | ICD-10-CM | POA: Diagnosis not present

## 2020-01-17 DIAGNOSIS — G4733 Obstructive sleep apnea (adult) (pediatric): Secondary | ICD-10-CM | POA: Diagnosis not present

## 2020-01-17 DIAGNOSIS — R000 Tachycardia, unspecified: Secondary | ICD-10-CM | POA: Diagnosis not present

## 2020-01-17 DIAGNOSIS — E1165 Type 2 diabetes mellitus with hyperglycemia: Secondary | ICD-10-CM | POA: Diagnosis not present

## 2020-01-17 DIAGNOSIS — Z6841 Body Mass Index (BMI) 40.0 and over, adult: Secondary | ICD-10-CM | POA: Diagnosis not present

## 2020-01-17 DIAGNOSIS — Z86718 Personal history of other venous thrombosis and embolism: Secondary | ICD-10-CM | POA: Diagnosis not present

## 2020-01-17 DIAGNOSIS — G8250 Quadriplegia, unspecified: Secondary | ICD-10-CM | POA: Diagnosis not present

## 2020-01-17 DIAGNOSIS — I69365 Other paralytic syndrome following cerebral infarction, bilateral: Secondary | ICD-10-CM | POA: Diagnosis not present

## 2020-01-17 DIAGNOSIS — Z9181 History of falling: Secondary | ICD-10-CM | POA: Diagnosis not present

## 2020-01-17 DIAGNOSIS — Z7401 Bed confinement status: Secondary | ICD-10-CM | POA: Diagnosis not present

## 2020-01-21 DIAGNOSIS — G8250 Quadriplegia, unspecified: Secondary | ICD-10-CM | POA: Diagnosis not present

## 2020-01-21 DIAGNOSIS — Z86718 Personal history of other venous thrombosis and embolism: Secondary | ICD-10-CM | POA: Diagnosis not present

## 2020-01-21 DIAGNOSIS — G40909 Epilepsy, unspecified, not intractable, without status epilepticus: Secondary | ICD-10-CM | POA: Diagnosis not present

## 2020-01-21 DIAGNOSIS — G4733 Obstructive sleep apnea (adult) (pediatric): Secondary | ICD-10-CM | POA: Diagnosis not present

## 2020-01-21 DIAGNOSIS — Z9181 History of falling: Secondary | ICD-10-CM | POA: Diagnosis not present

## 2020-01-21 DIAGNOSIS — D649 Anemia, unspecified: Secondary | ICD-10-CM | POA: Diagnosis not present

## 2020-01-21 DIAGNOSIS — R339 Retention of urine, unspecified: Secondary | ICD-10-CM | POA: Diagnosis not present

## 2020-01-21 DIAGNOSIS — Z87440 Personal history of urinary (tract) infections: Secondary | ICD-10-CM | POA: Diagnosis not present

## 2020-01-21 DIAGNOSIS — R000 Tachycardia, unspecified: Secondary | ICD-10-CM | POA: Diagnosis not present

## 2020-01-21 DIAGNOSIS — I10 Essential (primary) hypertension: Secondary | ICD-10-CM | POA: Diagnosis not present

## 2020-01-21 DIAGNOSIS — Z7401 Bed confinement status: Secondary | ICD-10-CM | POA: Diagnosis not present

## 2020-01-21 DIAGNOSIS — I69320 Aphasia following cerebral infarction: Secondary | ICD-10-CM | POA: Diagnosis not present

## 2020-01-21 DIAGNOSIS — I69365 Other paralytic syndrome following cerebral infarction, bilateral: Secondary | ICD-10-CM | POA: Diagnosis not present

## 2020-01-21 DIAGNOSIS — E1165 Type 2 diabetes mellitus with hyperglycemia: Secondary | ICD-10-CM | POA: Diagnosis not present

## 2020-01-21 DIAGNOSIS — Z6841 Body Mass Index (BMI) 40.0 and over, adult: Secondary | ICD-10-CM | POA: Diagnosis not present

## 2020-01-22 DIAGNOSIS — R2689 Other abnormalities of gait and mobility: Secondary | ICD-10-CM | POA: Diagnosis not present

## 2020-01-22 DIAGNOSIS — R262 Difficulty in walking, not elsewhere classified: Secondary | ICD-10-CM | POA: Diagnosis not present

## 2020-01-22 DIAGNOSIS — I6389 Other cerebral infarction: Secondary | ICD-10-CM | POA: Diagnosis not present

## 2020-01-22 DIAGNOSIS — I639 Cerebral infarction, unspecified: Secondary | ICD-10-CM | POA: Diagnosis not present

## 2020-01-23 DIAGNOSIS — Z86718 Personal history of other venous thrombosis and embolism: Secondary | ICD-10-CM | POA: Diagnosis not present

## 2020-01-23 DIAGNOSIS — I10 Essential (primary) hypertension: Secondary | ICD-10-CM | POA: Diagnosis not present

## 2020-01-23 DIAGNOSIS — I69365 Other paralytic syndrome following cerebral infarction, bilateral: Secondary | ICD-10-CM | POA: Diagnosis not present

## 2020-01-23 DIAGNOSIS — G8250 Quadriplegia, unspecified: Secondary | ICD-10-CM | POA: Diagnosis not present

## 2020-01-23 DIAGNOSIS — D649 Anemia, unspecified: Secondary | ICD-10-CM | POA: Diagnosis not present

## 2020-01-23 DIAGNOSIS — Z9181 History of falling: Secondary | ICD-10-CM | POA: Diagnosis not present

## 2020-01-23 DIAGNOSIS — Z7401 Bed confinement status: Secondary | ICD-10-CM | POA: Diagnosis not present

## 2020-01-23 DIAGNOSIS — Z87440 Personal history of urinary (tract) infections: Secondary | ICD-10-CM | POA: Diagnosis not present

## 2020-01-23 DIAGNOSIS — Z6841 Body Mass Index (BMI) 40.0 and over, adult: Secondary | ICD-10-CM | POA: Diagnosis not present

## 2020-01-23 DIAGNOSIS — R000 Tachycardia, unspecified: Secondary | ICD-10-CM | POA: Diagnosis not present

## 2020-01-23 DIAGNOSIS — I69320 Aphasia following cerebral infarction: Secondary | ICD-10-CM | POA: Diagnosis not present

## 2020-01-23 DIAGNOSIS — R339 Retention of urine, unspecified: Secondary | ICD-10-CM | POA: Diagnosis not present

## 2020-01-23 DIAGNOSIS — G40909 Epilepsy, unspecified, not intractable, without status epilepticus: Secondary | ICD-10-CM | POA: Diagnosis not present

## 2020-01-23 DIAGNOSIS — E1165 Type 2 diabetes mellitus with hyperglycemia: Secondary | ICD-10-CM | POA: Diagnosis not present

## 2020-01-23 DIAGNOSIS — G4733 Obstructive sleep apnea (adult) (pediatric): Secondary | ICD-10-CM | POA: Diagnosis not present

## 2020-01-27 DIAGNOSIS — G4733 Obstructive sleep apnea (adult) (pediatric): Secondary | ICD-10-CM | POA: Diagnosis not present

## 2020-01-27 DIAGNOSIS — E1165 Type 2 diabetes mellitus with hyperglycemia: Secondary | ICD-10-CM | POA: Diagnosis not present

## 2020-01-27 DIAGNOSIS — I10 Essential (primary) hypertension: Secondary | ICD-10-CM | POA: Diagnosis not present

## 2020-01-27 DIAGNOSIS — Z86718 Personal history of other venous thrombosis and embolism: Secondary | ICD-10-CM | POA: Diagnosis not present

## 2020-01-27 DIAGNOSIS — G8250 Quadriplegia, unspecified: Secondary | ICD-10-CM | POA: Diagnosis not present

## 2020-01-27 DIAGNOSIS — I69320 Aphasia following cerebral infarction: Secondary | ICD-10-CM | POA: Diagnosis not present

## 2020-01-27 DIAGNOSIS — Z6841 Body Mass Index (BMI) 40.0 and over, adult: Secondary | ICD-10-CM | POA: Diagnosis not present

## 2020-01-27 DIAGNOSIS — Z7401 Bed confinement status: Secondary | ICD-10-CM | POA: Diagnosis not present

## 2020-01-27 DIAGNOSIS — D649 Anemia, unspecified: Secondary | ICD-10-CM | POA: Diagnosis not present

## 2020-01-27 DIAGNOSIS — Z9181 History of falling: Secondary | ICD-10-CM | POA: Diagnosis not present

## 2020-01-27 DIAGNOSIS — G40909 Epilepsy, unspecified, not intractable, without status epilepticus: Secondary | ICD-10-CM | POA: Diagnosis not present

## 2020-01-27 DIAGNOSIS — Z87440 Personal history of urinary (tract) infections: Secondary | ICD-10-CM | POA: Diagnosis not present

## 2020-01-27 DIAGNOSIS — R339 Retention of urine, unspecified: Secondary | ICD-10-CM | POA: Diagnosis not present

## 2020-01-27 DIAGNOSIS — R000 Tachycardia, unspecified: Secondary | ICD-10-CM | POA: Diagnosis not present

## 2020-01-27 DIAGNOSIS — I69365 Other paralytic syndrome following cerebral infarction, bilateral: Secondary | ICD-10-CM | POA: Diagnosis not present

## 2020-01-28 DIAGNOSIS — G40909 Epilepsy, unspecified, not intractable, without status epilepticus: Secondary | ICD-10-CM | POA: Diagnosis not present

## 2020-01-28 DIAGNOSIS — Z9181 History of falling: Secondary | ICD-10-CM | POA: Diagnosis not present

## 2020-01-28 DIAGNOSIS — G4733 Obstructive sleep apnea (adult) (pediatric): Secondary | ICD-10-CM | POA: Diagnosis not present

## 2020-01-28 DIAGNOSIS — I69320 Aphasia following cerebral infarction: Secondary | ICD-10-CM | POA: Diagnosis not present

## 2020-01-28 DIAGNOSIS — I69365 Other paralytic syndrome following cerebral infarction, bilateral: Secondary | ICD-10-CM | POA: Diagnosis not present

## 2020-01-28 DIAGNOSIS — D649 Anemia, unspecified: Secondary | ICD-10-CM | POA: Diagnosis not present

## 2020-01-28 DIAGNOSIS — I10 Essential (primary) hypertension: Secondary | ICD-10-CM | POA: Diagnosis not present

## 2020-01-28 DIAGNOSIS — Z87440 Personal history of urinary (tract) infections: Secondary | ICD-10-CM | POA: Diagnosis not present

## 2020-01-28 DIAGNOSIS — Z6841 Body Mass Index (BMI) 40.0 and over, adult: Secondary | ICD-10-CM | POA: Diagnosis not present

## 2020-01-28 DIAGNOSIS — R339 Retention of urine, unspecified: Secondary | ICD-10-CM | POA: Diagnosis not present

## 2020-01-28 DIAGNOSIS — E1165 Type 2 diabetes mellitus with hyperglycemia: Secondary | ICD-10-CM | POA: Diagnosis not present

## 2020-01-28 DIAGNOSIS — R000 Tachycardia, unspecified: Secondary | ICD-10-CM | POA: Diagnosis not present

## 2020-01-28 DIAGNOSIS — Z86718 Personal history of other venous thrombosis and embolism: Secondary | ICD-10-CM | POA: Diagnosis not present

## 2020-01-28 DIAGNOSIS — Z7401 Bed confinement status: Secondary | ICD-10-CM | POA: Diagnosis not present

## 2020-01-28 DIAGNOSIS — G8250 Quadriplegia, unspecified: Secondary | ICD-10-CM | POA: Diagnosis not present

## 2020-01-29 DIAGNOSIS — Z87440 Personal history of urinary (tract) infections: Secondary | ICD-10-CM | POA: Diagnosis not present

## 2020-01-29 DIAGNOSIS — G40909 Epilepsy, unspecified, not intractable, without status epilepticus: Secondary | ICD-10-CM | POA: Diagnosis not present

## 2020-01-29 DIAGNOSIS — Z7401 Bed confinement status: Secondary | ICD-10-CM | POA: Diagnosis not present

## 2020-01-29 DIAGNOSIS — E1165 Type 2 diabetes mellitus with hyperglycemia: Secondary | ICD-10-CM | POA: Diagnosis not present

## 2020-01-29 DIAGNOSIS — I69320 Aphasia following cerebral infarction: Secondary | ICD-10-CM | POA: Diagnosis not present

## 2020-01-29 DIAGNOSIS — Z6841 Body Mass Index (BMI) 40.0 and over, adult: Secondary | ICD-10-CM | POA: Diagnosis not present

## 2020-01-29 DIAGNOSIS — R339 Retention of urine, unspecified: Secondary | ICD-10-CM | POA: Diagnosis not present

## 2020-01-29 DIAGNOSIS — Z86718 Personal history of other venous thrombosis and embolism: Secondary | ICD-10-CM | POA: Diagnosis not present

## 2020-01-29 DIAGNOSIS — T8189XA Other complications of procedures, not elsewhere classified, initial encounter: Secondary | ICD-10-CM | POA: Diagnosis not present

## 2020-01-29 DIAGNOSIS — I10 Essential (primary) hypertension: Secondary | ICD-10-CM | POA: Diagnosis not present

## 2020-01-29 DIAGNOSIS — G4733 Obstructive sleep apnea (adult) (pediatric): Secondary | ICD-10-CM | POA: Diagnosis not present

## 2020-01-29 DIAGNOSIS — R000 Tachycardia, unspecified: Secondary | ICD-10-CM | POA: Diagnosis not present

## 2020-01-29 DIAGNOSIS — Z9181 History of falling: Secondary | ICD-10-CM | POA: Diagnosis not present

## 2020-01-29 DIAGNOSIS — D649 Anemia, unspecified: Secondary | ICD-10-CM | POA: Diagnosis not present

## 2020-01-29 DIAGNOSIS — I69365 Other paralytic syndrome following cerebral infarction, bilateral: Secondary | ICD-10-CM | POA: Diagnosis not present

## 2020-01-29 DIAGNOSIS — G8250 Quadriplegia, unspecified: Secondary | ICD-10-CM | POA: Diagnosis not present

## 2020-01-30 DIAGNOSIS — E1165 Type 2 diabetes mellitus with hyperglycemia: Secondary | ICD-10-CM | POA: Diagnosis not present

## 2020-01-30 DIAGNOSIS — I69320 Aphasia following cerebral infarction: Secondary | ICD-10-CM | POA: Diagnosis not present

## 2020-01-30 DIAGNOSIS — R000 Tachycardia, unspecified: Secondary | ICD-10-CM | POA: Diagnosis not present

## 2020-01-30 DIAGNOSIS — Z7401 Bed confinement status: Secondary | ICD-10-CM | POA: Diagnosis not present

## 2020-01-30 DIAGNOSIS — G8250 Quadriplegia, unspecified: Secondary | ICD-10-CM | POA: Diagnosis not present

## 2020-01-30 DIAGNOSIS — G4733 Obstructive sleep apnea (adult) (pediatric): Secondary | ICD-10-CM | POA: Diagnosis not present

## 2020-01-30 DIAGNOSIS — Z87440 Personal history of urinary (tract) infections: Secondary | ICD-10-CM | POA: Diagnosis not present

## 2020-01-30 DIAGNOSIS — I69365 Other paralytic syndrome following cerebral infarction, bilateral: Secondary | ICD-10-CM | POA: Diagnosis not present

## 2020-01-30 DIAGNOSIS — Z9181 History of falling: Secondary | ICD-10-CM | POA: Diagnosis not present

## 2020-01-30 DIAGNOSIS — D649 Anemia, unspecified: Secondary | ICD-10-CM | POA: Diagnosis not present

## 2020-01-30 DIAGNOSIS — Z86718 Personal history of other venous thrombosis and embolism: Secondary | ICD-10-CM | POA: Diagnosis not present

## 2020-01-30 DIAGNOSIS — Z6841 Body Mass Index (BMI) 40.0 and over, adult: Secondary | ICD-10-CM | POA: Diagnosis not present

## 2020-01-30 DIAGNOSIS — R339 Retention of urine, unspecified: Secondary | ICD-10-CM | POA: Diagnosis not present

## 2020-01-30 DIAGNOSIS — G40909 Epilepsy, unspecified, not intractable, without status epilepticus: Secondary | ICD-10-CM | POA: Diagnosis not present

## 2020-01-30 DIAGNOSIS — I10 Essential (primary) hypertension: Secondary | ICD-10-CM | POA: Diagnosis not present

## 2020-01-31 DIAGNOSIS — L8994 Pressure ulcer of unspecified site, stage 4: Secondary | ICD-10-CM | POA: Diagnosis not present

## 2020-01-31 DIAGNOSIS — G4733 Obstructive sleep apnea (adult) (pediatric): Secondary | ICD-10-CM | POA: Diagnosis not present

## 2020-01-31 DIAGNOSIS — I6389 Other cerebral infarction: Secondary | ICD-10-CM | POA: Diagnosis not present

## 2020-02-03 DIAGNOSIS — G8250 Quadriplegia, unspecified: Secondary | ICD-10-CM | POA: Diagnosis not present

## 2020-02-03 DIAGNOSIS — G40909 Epilepsy, unspecified, not intractable, without status epilepticus: Secondary | ICD-10-CM | POA: Diagnosis not present

## 2020-02-03 DIAGNOSIS — Z87440 Personal history of urinary (tract) infections: Secondary | ICD-10-CM | POA: Diagnosis not present

## 2020-02-03 DIAGNOSIS — Z7401 Bed confinement status: Secondary | ICD-10-CM | POA: Diagnosis not present

## 2020-02-03 DIAGNOSIS — D649 Anemia, unspecified: Secondary | ICD-10-CM | POA: Diagnosis not present

## 2020-02-03 DIAGNOSIS — Z86718 Personal history of other venous thrombosis and embolism: Secondary | ICD-10-CM | POA: Diagnosis not present

## 2020-02-03 DIAGNOSIS — I69365 Other paralytic syndrome following cerebral infarction, bilateral: Secondary | ICD-10-CM | POA: Diagnosis not present

## 2020-02-03 DIAGNOSIS — G4733 Obstructive sleep apnea (adult) (pediatric): Secondary | ICD-10-CM | POA: Diagnosis not present

## 2020-02-03 DIAGNOSIS — Z9181 History of falling: Secondary | ICD-10-CM | POA: Diagnosis not present

## 2020-02-03 DIAGNOSIS — I10 Essential (primary) hypertension: Secondary | ICD-10-CM | POA: Diagnosis not present

## 2020-02-03 DIAGNOSIS — R000 Tachycardia, unspecified: Secondary | ICD-10-CM | POA: Diagnosis not present

## 2020-02-03 DIAGNOSIS — R339 Retention of urine, unspecified: Secondary | ICD-10-CM | POA: Diagnosis not present

## 2020-02-03 DIAGNOSIS — I69320 Aphasia following cerebral infarction: Secondary | ICD-10-CM | POA: Diagnosis not present

## 2020-02-03 DIAGNOSIS — Z6841 Body Mass Index (BMI) 40.0 and over, adult: Secondary | ICD-10-CM | POA: Diagnosis not present

## 2020-02-03 DIAGNOSIS — E1165 Type 2 diabetes mellitus with hyperglycemia: Secondary | ICD-10-CM | POA: Diagnosis not present

## 2020-02-04 DIAGNOSIS — G8250 Quadriplegia, unspecified: Secondary | ICD-10-CM | POA: Diagnosis not present

## 2020-02-04 DIAGNOSIS — R000 Tachycardia, unspecified: Secondary | ICD-10-CM | POA: Diagnosis not present

## 2020-02-04 DIAGNOSIS — Z7401 Bed confinement status: Secondary | ICD-10-CM | POA: Diagnosis not present

## 2020-02-04 DIAGNOSIS — Z86718 Personal history of other venous thrombosis and embolism: Secondary | ICD-10-CM | POA: Diagnosis not present

## 2020-02-04 DIAGNOSIS — Z6841 Body Mass Index (BMI) 40.0 and over, adult: Secondary | ICD-10-CM | POA: Diagnosis not present

## 2020-02-04 DIAGNOSIS — G4733 Obstructive sleep apnea (adult) (pediatric): Secondary | ICD-10-CM | POA: Diagnosis not present

## 2020-02-04 DIAGNOSIS — I10 Essential (primary) hypertension: Secondary | ICD-10-CM | POA: Diagnosis not present

## 2020-02-04 DIAGNOSIS — D649 Anemia, unspecified: Secondary | ICD-10-CM | POA: Diagnosis not present

## 2020-02-04 DIAGNOSIS — I69320 Aphasia following cerebral infarction: Secondary | ICD-10-CM | POA: Diagnosis not present

## 2020-02-04 DIAGNOSIS — Z87440 Personal history of urinary (tract) infections: Secondary | ICD-10-CM | POA: Diagnosis not present

## 2020-02-04 DIAGNOSIS — E1165 Type 2 diabetes mellitus with hyperglycemia: Secondary | ICD-10-CM | POA: Diagnosis not present

## 2020-02-04 DIAGNOSIS — R339 Retention of urine, unspecified: Secondary | ICD-10-CM | POA: Diagnosis not present

## 2020-02-04 DIAGNOSIS — G40909 Epilepsy, unspecified, not intractable, without status epilepticus: Secondary | ICD-10-CM | POA: Diagnosis not present

## 2020-02-04 DIAGNOSIS — Z9181 History of falling: Secondary | ICD-10-CM | POA: Diagnosis not present

## 2020-02-04 DIAGNOSIS — I69365 Other paralytic syndrome following cerebral infarction, bilateral: Secondary | ICD-10-CM | POA: Diagnosis not present

## 2020-02-05 DIAGNOSIS — D649 Anemia, unspecified: Secondary | ICD-10-CM | POA: Diagnosis not present

## 2020-02-05 DIAGNOSIS — G40909 Epilepsy, unspecified, not intractable, without status epilepticus: Secondary | ICD-10-CM | POA: Diagnosis not present

## 2020-02-05 DIAGNOSIS — Z6841 Body Mass Index (BMI) 40.0 and over, adult: Secondary | ICD-10-CM | POA: Diagnosis not present

## 2020-02-05 DIAGNOSIS — I69320 Aphasia following cerebral infarction: Secondary | ICD-10-CM | POA: Diagnosis not present

## 2020-02-05 DIAGNOSIS — R339 Retention of urine, unspecified: Secondary | ICD-10-CM | POA: Diagnosis not present

## 2020-02-05 DIAGNOSIS — Z7401 Bed confinement status: Secondary | ICD-10-CM | POA: Diagnosis not present

## 2020-02-05 DIAGNOSIS — I10 Essential (primary) hypertension: Secondary | ICD-10-CM | POA: Diagnosis not present

## 2020-02-05 DIAGNOSIS — E1165 Type 2 diabetes mellitus with hyperglycemia: Secondary | ICD-10-CM | POA: Diagnosis not present

## 2020-02-05 DIAGNOSIS — I69365 Other paralytic syndrome following cerebral infarction, bilateral: Secondary | ICD-10-CM | POA: Diagnosis not present

## 2020-02-05 DIAGNOSIS — Z87440 Personal history of urinary (tract) infections: Secondary | ICD-10-CM | POA: Diagnosis not present

## 2020-02-05 DIAGNOSIS — Z9181 History of falling: Secondary | ICD-10-CM | POA: Diagnosis not present

## 2020-02-05 DIAGNOSIS — G4733 Obstructive sleep apnea (adult) (pediatric): Secondary | ICD-10-CM | POA: Diagnosis not present

## 2020-02-05 DIAGNOSIS — Z86718 Personal history of other venous thrombosis and embolism: Secondary | ICD-10-CM | POA: Diagnosis not present

## 2020-02-05 DIAGNOSIS — G8250 Quadriplegia, unspecified: Secondary | ICD-10-CM | POA: Diagnosis not present

## 2020-02-05 DIAGNOSIS — R000 Tachycardia, unspecified: Secondary | ICD-10-CM | POA: Diagnosis not present

## 2020-02-06 DIAGNOSIS — Z6841 Body Mass Index (BMI) 40.0 and over, adult: Secondary | ICD-10-CM | POA: Diagnosis not present

## 2020-02-06 DIAGNOSIS — Z9181 History of falling: Secondary | ICD-10-CM | POA: Diagnosis not present

## 2020-02-06 DIAGNOSIS — I6389 Other cerebral infarction: Secondary | ICD-10-CM | POA: Diagnosis not present

## 2020-02-06 DIAGNOSIS — I69320 Aphasia following cerebral infarction: Secondary | ICD-10-CM | POA: Diagnosis not present

## 2020-02-06 DIAGNOSIS — Z87440 Personal history of urinary (tract) infections: Secondary | ICD-10-CM | POA: Diagnosis not present

## 2020-02-06 DIAGNOSIS — R000 Tachycardia, unspecified: Secondary | ICD-10-CM | POA: Diagnosis not present

## 2020-02-06 DIAGNOSIS — I10 Essential (primary) hypertension: Secondary | ICD-10-CM | POA: Diagnosis not present

## 2020-02-06 DIAGNOSIS — Z7401 Bed confinement status: Secondary | ICD-10-CM | POA: Diagnosis not present

## 2020-02-06 DIAGNOSIS — D649 Anemia, unspecified: Secondary | ICD-10-CM | POA: Diagnosis not present

## 2020-02-06 DIAGNOSIS — L8994 Pressure ulcer of unspecified site, stage 4: Secondary | ICD-10-CM | POA: Diagnosis not present

## 2020-02-06 DIAGNOSIS — R339 Retention of urine, unspecified: Secondary | ICD-10-CM | POA: Diagnosis not present

## 2020-02-06 DIAGNOSIS — I69365 Other paralytic syndrome following cerebral infarction, bilateral: Secondary | ICD-10-CM | POA: Diagnosis not present

## 2020-02-06 DIAGNOSIS — G40909 Epilepsy, unspecified, not intractable, without status epilepticus: Secondary | ICD-10-CM | POA: Diagnosis not present

## 2020-02-06 DIAGNOSIS — Z86718 Personal history of other venous thrombosis and embolism: Secondary | ICD-10-CM | POA: Diagnosis not present

## 2020-02-06 DIAGNOSIS — G4733 Obstructive sleep apnea (adult) (pediatric): Secondary | ICD-10-CM | POA: Diagnosis not present

## 2020-02-06 DIAGNOSIS — E1165 Type 2 diabetes mellitus with hyperglycemia: Secondary | ICD-10-CM | POA: Diagnosis not present

## 2020-02-06 DIAGNOSIS — G8250 Quadriplegia, unspecified: Secondary | ICD-10-CM | POA: Diagnosis not present

## 2020-02-07 DIAGNOSIS — L89154 Pressure ulcer of sacral region, stage 4: Secondary | ICD-10-CM | POA: Diagnosis not present

## 2020-02-07 DIAGNOSIS — T8189XA Other complications of procedures, not elsewhere classified, initial encounter: Secondary | ICD-10-CM | POA: Diagnosis not present

## 2020-02-09 DIAGNOSIS — L8994 Pressure ulcer of unspecified site, stage 4: Secondary | ICD-10-CM | POA: Diagnosis not present

## 2020-02-09 DIAGNOSIS — G4733 Obstructive sleep apnea (adult) (pediatric): Secondary | ICD-10-CM | POA: Diagnosis not present

## 2020-02-09 DIAGNOSIS — I6389 Other cerebral infarction: Secondary | ICD-10-CM | POA: Diagnosis not present

## 2020-02-10 DIAGNOSIS — E1165 Type 2 diabetes mellitus with hyperglycemia: Secondary | ICD-10-CM | POA: Diagnosis not present

## 2020-02-10 DIAGNOSIS — Z87440 Personal history of urinary (tract) infections: Secondary | ICD-10-CM | POA: Diagnosis not present

## 2020-02-10 DIAGNOSIS — G40909 Epilepsy, unspecified, not intractable, without status epilepticus: Secondary | ICD-10-CM | POA: Diagnosis not present

## 2020-02-10 DIAGNOSIS — Z86718 Personal history of other venous thrombosis and embolism: Secondary | ICD-10-CM | POA: Diagnosis not present

## 2020-02-10 DIAGNOSIS — I69320 Aphasia following cerebral infarction: Secondary | ICD-10-CM | POA: Diagnosis not present

## 2020-02-10 DIAGNOSIS — G8250 Quadriplegia, unspecified: Secondary | ICD-10-CM | POA: Diagnosis not present

## 2020-02-10 DIAGNOSIS — D649 Anemia, unspecified: Secondary | ICD-10-CM | POA: Diagnosis not present

## 2020-02-10 DIAGNOSIS — Z6841 Body Mass Index (BMI) 40.0 and over, adult: Secondary | ICD-10-CM | POA: Diagnosis not present

## 2020-02-10 DIAGNOSIS — I10 Essential (primary) hypertension: Secondary | ICD-10-CM | POA: Diagnosis not present

## 2020-02-10 DIAGNOSIS — I69365 Other paralytic syndrome following cerebral infarction, bilateral: Secondary | ICD-10-CM | POA: Diagnosis not present

## 2020-02-10 DIAGNOSIS — R339 Retention of urine, unspecified: Secondary | ICD-10-CM | POA: Diagnosis not present

## 2020-02-10 DIAGNOSIS — R000 Tachycardia, unspecified: Secondary | ICD-10-CM | POA: Diagnosis not present

## 2020-02-10 DIAGNOSIS — Z7401 Bed confinement status: Secondary | ICD-10-CM | POA: Diagnosis not present

## 2020-02-10 DIAGNOSIS — Z9181 History of falling: Secondary | ICD-10-CM | POA: Diagnosis not present

## 2020-02-10 DIAGNOSIS — G4733 Obstructive sleep apnea (adult) (pediatric): Secondary | ICD-10-CM | POA: Diagnosis not present

## 2020-02-11 DIAGNOSIS — E1165 Type 2 diabetes mellitus with hyperglycemia: Secondary | ICD-10-CM | POA: Diagnosis not present

## 2020-02-11 DIAGNOSIS — Z87440 Personal history of urinary (tract) infections: Secondary | ICD-10-CM | POA: Diagnosis not present

## 2020-02-11 DIAGNOSIS — I69365 Other paralytic syndrome following cerebral infarction, bilateral: Secondary | ICD-10-CM | POA: Diagnosis not present

## 2020-02-11 DIAGNOSIS — R000 Tachycardia, unspecified: Secondary | ICD-10-CM | POA: Diagnosis not present

## 2020-02-11 DIAGNOSIS — Z9181 History of falling: Secondary | ICD-10-CM | POA: Diagnosis not present

## 2020-02-11 DIAGNOSIS — G40909 Epilepsy, unspecified, not intractable, without status epilepticus: Secondary | ICD-10-CM | POA: Diagnosis not present

## 2020-02-11 DIAGNOSIS — G4733 Obstructive sleep apnea (adult) (pediatric): Secondary | ICD-10-CM | POA: Diagnosis not present

## 2020-02-11 DIAGNOSIS — I10 Essential (primary) hypertension: Secondary | ICD-10-CM | POA: Diagnosis not present

## 2020-02-11 DIAGNOSIS — D649 Anemia, unspecified: Secondary | ICD-10-CM | POA: Diagnosis not present

## 2020-02-11 DIAGNOSIS — Z6841 Body Mass Index (BMI) 40.0 and over, adult: Secondary | ICD-10-CM | POA: Diagnosis not present

## 2020-02-11 DIAGNOSIS — I69320 Aphasia following cerebral infarction: Secondary | ICD-10-CM | POA: Diagnosis not present

## 2020-02-11 DIAGNOSIS — G8250 Quadriplegia, unspecified: Secondary | ICD-10-CM | POA: Diagnosis not present

## 2020-02-11 DIAGNOSIS — Z7401 Bed confinement status: Secondary | ICD-10-CM | POA: Diagnosis not present

## 2020-02-11 DIAGNOSIS — Z86718 Personal history of other venous thrombosis and embolism: Secondary | ICD-10-CM | POA: Diagnosis not present

## 2020-02-11 DIAGNOSIS — R339 Retention of urine, unspecified: Secondary | ICD-10-CM | POA: Diagnosis not present

## 2020-02-12 DIAGNOSIS — R000 Tachycardia, unspecified: Secondary | ICD-10-CM | POA: Diagnosis not present

## 2020-02-12 DIAGNOSIS — G4733 Obstructive sleep apnea (adult) (pediatric): Secondary | ICD-10-CM | POA: Diagnosis not present

## 2020-02-12 DIAGNOSIS — G40909 Epilepsy, unspecified, not intractable, without status epilepticus: Secondary | ICD-10-CM | POA: Diagnosis not present

## 2020-02-12 DIAGNOSIS — Z7401 Bed confinement status: Secondary | ICD-10-CM | POA: Diagnosis not present

## 2020-02-12 DIAGNOSIS — R339 Retention of urine, unspecified: Secondary | ICD-10-CM | POA: Diagnosis not present

## 2020-02-12 DIAGNOSIS — I10 Essential (primary) hypertension: Secondary | ICD-10-CM | POA: Diagnosis not present

## 2020-02-12 DIAGNOSIS — D649 Anemia, unspecified: Secondary | ICD-10-CM | POA: Diagnosis not present

## 2020-02-12 DIAGNOSIS — I69365 Other paralytic syndrome following cerebral infarction, bilateral: Secondary | ICD-10-CM | POA: Diagnosis not present

## 2020-02-12 DIAGNOSIS — G8250 Quadriplegia, unspecified: Secondary | ICD-10-CM | POA: Diagnosis not present

## 2020-02-12 DIAGNOSIS — Z87440 Personal history of urinary (tract) infections: Secondary | ICD-10-CM | POA: Diagnosis not present

## 2020-02-12 DIAGNOSIS — E1165 Type 2 diabetes mellitus with hyperglycemia: Secondary | ICD-10-CM | POA: Diagnosis not present

## 2020-02-12 DIAGNOSIS — Z86718 Personal history of other venous thrombosis and embolism: Secondary | ICD-10-CM | POA: Diagnosis not present

## 2020-02-12 DIAGNOSIS — I69320 Aphasia following cerebral infarction: Secondary | ICD-10-CM | POA: Diagnosis not present

## 2020-02-12 DIAGNOSIS — Z9181 History of falling: Secondary | ICD-10-CM | POA: Diagnosis not present

## 2020-02-12 DIAGNOSIS — Z6841 Body Mass Index (BMI) 40.0 and over, adult: Secondary | ICD-10-CM | POA: Diagnosis not present

## 2020-02-13 DIAGNOSIS — G40909 Epilepsy, unspecified, not intractable, without status epilepticus: Secondary | ICD-10-CM | POA: Diagnosis not present

## 2020-02-13 DIAGNOSIS — R000 Tachycardia, unspecified: Secondary | ICD-10-CM | POA: Diagnosis not present

## 2020-02-13 DIAGNOSIS — I69365 Other paralytic syndrome following cerebral infarction, bilateral: Secondary | ICD-10-CM | POA: Diagnosis not present

## 2020-02-13 DIAGNOSIS — E1165 Type 2 diabetes mellitus with hyperglycemia: Secondary | ICD-10-CM | POA: Diagnosis not present

## 2020-02-13 DIAGNOSIS — D649 Anemia, unspecified: Secondary | ICD-10-CM | POA: Diagnosis not present

## 2020-02-13 DIAGNOSIS — R339 Retention of urine, unspecified: Secondary | ICD-10-CM | POA: Diagnosis not present

## 2020-02-13 DIAGNOSIS — Z9181 History of falling: Secondary | ICD-10-CM | POA: Diagnosis not present

## 2020-02-13 DIAGNOSIS — Z86718 Personal history of other venous thrombosis and embolism: Secondary | ICD-10-CM | POA: Diagnosis not present

## 2020-02-13 DIAGNOSIS — Z7401 Bed confinement status: Secondary | ICD-10-CM | POA: Diagnosis not present

## 2020-02-13 DIAGNOSIS — Z87440 Personal history of urinary (tract) infections: Secondary | ICD-10-CM | POA: Diagnosis not present

## 2020-02-13 DIAGNOSIS — G8250 Quadriplegia, unspecified: Secondary | ICD-10-CM | POA: Diagnosis not present

## 2020-02-13 DIAGNOSIS — I10 Essential (primary) hypertension: Secondary | ICD-10-CM | POA: Diagnosis not present

## 2020-02-13 DIAGNOSIS — Z6841 Body Mass Index (BMI) 40.0 and over, adult: Secondary | ICD-10-CM | POA: Diagnosis not present

## 2020-02-13 DIAGNOSIS — G4733 Obstructive sleep apnea (adult) (pediatric): Secondary | ICD-10-CM | POA: Diagnosis not present

## 2020-02-13 DIAGNOSIS — I69320 Aphasia following cerebral infarction: Secondary | ICD-10-CM | POA: Diagnosis not present

## 2020-02-14 DIAGNOSIS — L8994 Pressure ulcer of unspecified site, stage 4: Secondary | ICD-10-CM | POA: Diagnosis not present

## 2020-02-14 DIAGNOSIS — G40909 Epilepsy, unspecified, not intractable, without status epilepticus: Secondary | ICD-10-CM | POA: Diagnosis not present

## 2020-02-14 DIAGNOSIS — E1165 Type 2 diabetes mellitus with hyperglycemia: Secondary | ICD-10-CM | POA: Diagnosis not present

## 2020-02-14 DIAGNOSIS — Z6841 Body Mass Index (BMI) 40.0 and over, adult: Secondary | ICD-10-CM | POA: Diagnosis not present

## 2020-02-14 DIAGNOSIS — Z9181 History of falling: Secondary | ICD-10-CM | POA: Diagnosis not present

## 2020-02-14 DIAGNOSIS — G4733 Obstructive sleep apnea (adult) (pediatric): Secondary | ICD-10-CM | POA: Diagnosis not present

## 2020-02-14 DIAGNOSIS — I69365 Other paralytic syndrome following cerebral infarction, bilateral: Secondary | ICD-10-CM | POA: Diagnosis not present

## 2020-02-14 DIAGNOSIS — G8250 Quadriplegia, unspecified: Secondary | ICD-10-CM | POA: Diagnosis not present

## 2020-02-14 DIAGNOSIS — R000 Tachycardia, unspecified: Secondary | ICD-10-CM | POA: Diagnosis not present

## 2020-02-14 DIAGNOSIS — I10 Essential (primary) hypertension: Secondary | ICD-10-CM | POA: Diagnosis not present

## 2020-02-14 DIAGNOSIS — I69320 Aphasia following cerebral infarction: Secondary | ICD-10-CM | POA: Diagnosis not present

## 2020-02-14 DIAGNOSIS — R339 Retention of urine, unspecified: Secondary | ICD-10-CM | POA: Diagnosis not present

## 2020-02-14 DIAGNOSIS — Z87440 Personal history of urinary (tract) infections: Secondary | ICD-10-CM | POA: Diagnosis not present

## 2020-02-14 DIAGNOSIS — I6389 Other cerebral infarction: Secondary | ICD-10-CM | POA: Diagnosis not present

## 2020-02-14 DIAGNOSIS — Z86718 Personal history of other venous thrombosis and embolism: Secondary | ICD-10-CM | POA: Diagnosis not present

## 2020-02-14 DIAGNOSIS — D649 Anemia, unspecified: Secondary | ICD-10-CM | POA: Diagnosis not present

## 2020-02-14 DIAGNOSIS — Z7401 Bed confinement status: Secondary | ICD-10-CM | POA: Diagnosis not present

## 2020-02-17 DIAGNOSIS — I69365 Other paralytic syndrome following cerebral infarction, bilateral: Secondary | ICD-10-CM | POA: Diagnosis not present

## 2020-02-17 DIAGNOSIS — I69320 Aphasia following cerebral infarction: Secondary | ICD-10-CM | POA: Diagnosis not present

## 2020-02-17 DIAGNOSIS — G8250 Quadriplegia, unspecified: Secondary | ICD-10-CM | POA: Diagnosis not present

## 2020-02-18 DIAGNOSIS — G8250 Quadriplegia, unspecified: Secondary | ICD-10-CM | POA: Diagnosis not present

## 2020-02-18 DIAGNOSIS — Z7401 Bed confinement status: Secondary | ICD-10-CM | POA: Diagnosis not present

## 2020-02-18 DIAGNOSIS — R339 Retention of urine, unspecified: Secondary | ICD-10-CM | POA: Diagnosis not present

## 2020-02-18 DIAGNOSIS — I69365 Other paralytic syndrome following cerebral infarction, bilateral: Secondary | ICD-10-CM | POA: Diagnosis not present

## 2020-02-18 DIAGNOSIS — D649 Anemia, unspecified: Secondary | ICD-10-CM | POA: Diagnosis not present

## 2020-02-18 DIAGNOSIS — G40909 Epilepsy, unspecified, not intractable, without status epilepticus: Secondary | ICD-10-CM | POA: Diagnosis not present

## 2020-02-18 DIAGNOSIS — Z9181 History of falling: Secondary | ICD-10-CM | POA: Diagnosis not present

## 2020-02-18 DIAGNOSIS — Z6841 Body Mass Index (BMI) 40.0 and over, adult: Secondary | ICD-10-CM | POA: Diagnosis not present

## 2020-02-18 DIAGNOSIS — I10 Essential (primary) hypertension: Secondary | ICD-10-CM | POA: Diagnosis not present

## 2020-02-18 DIAGNOSIS — I69320 Aphasia following cerebral infarction: Secondary | ICD-10-CM | POA: Diagnosis not present

## 2020-02-18 DIAGNOSIS — E1165 Type 2 diabetes mellitus with hyperglycemia: Secondary | ICD-10-CM | POA: Diagnosis not present

## 2020-02-18 DIAGNOSIS — Z86718 Personal history of other venous thrombosis and embolism: Secondary | ICD-10-CM | POA: Diagnosis not present

## 2020-02-18 DIAGNOSIS — G4733 Obstructive sleep apnea (adult) (pediatric): Secondary | ICD-10-CM | POA: Diagnosis not present

## 2020-02-18 DIAGNOSIS — Z87440 Personal history of urinary (tract) infections: Secondary | ICD-10-CM | POA: Diagnosis not present

## 2020-02-18 DIAGNOSIS — R000 Tachycardia, unspecified: Secondary | ICD-10-CM | POA: Diagnosis not present

## 2020-02-19 DIAGNOSIS — G4733 Obstructive sleep apnea (adult) (pediatric): Secondary | ICD-10-CM | POA: Diagnosis not present

## 2020-02-19 DIAGNOSIS — R339 Retention of urine, unspecified: Secondary | ICD-10-CM | POA: Diagnosis not present

## 2020-02-19 DIAGNOSIS — Z86718 Personal history of other venous thrombosis and embolism: Secondary | ICD-10-CM | POA: Diagnosis not present

## 2020-02-19 DIAGNOSIS — R000 Tachycardia, unspecified: Secondary | ICD-10-CM | POA: Diagnosis not present

## 2020-02-19 DIAGNOSIS — Z87440 Personal history of urinary (tract) infections: Secondary | ICD-10-CM | POA: Diagnosis not present

## 2020-02-19 DIAGNOSIS — I10 Essential (primary) hypertension: Secondary | ICD-10-CM | POA: Diagnosis not present

## 2020-02-19 DIAGNOSIS — G8250 Quadriplegia, unspecified: Secondary | ICD-10-CM | POA: Diagnosis not present

## 2020-02-19 DIAGNOSIS — D649 Anemia, unspecified: Secondary | ICD-10-CM | POA: Diagnosis not present

## 2020-02-19 DIAGNOSIS — E1165 Type 2 diabetes mellitus with hyperglycemia: Secondary | ICD-10-CM | POA: Diagnosis not present

## 2020-02-19 DIAGNOSIS — Z7401 Bed confinement status: Secondary | ICD-10-CM | POA: Diagnosis not present

## 2020-02-19 DIAGNOSIS — Z6841 Body Mass Index (BMI) 40.0 and over, adult: Secondary | ICD-10-CM | POA: Diagnosis not present

## 2020-02-19 DIAGNOSIS — I69365 Other paralytic syndrome following cerebral infarction, bilateral: Secondary | ICD-10-CM | POA: Diagnosis not present

## 2020-02-19 DIAGNOSIS — Z9181 History of falling: Secondary | ICD-10-CM | POA: Diagnosis not present

## 2020-02-19 DIAGNOSIS — G40909 Epilepsy, unspecified, not intractable, without status epilepticus: Secondary | ICD-10-CM | POA: Diagnosis not present

## 2020-02-19 DIAGNOSIS — I69320 Aphasia following cerebral infarction: Secondary | ICD-10-CM | POA: Diagnosis not present

## 2020-02-20 DIAGNOSIS — G4733 Obstructive sleep apnea (adult) (pediatric): Secondary | ICD-10-CM | POA: Diagnosis not present

## 2020-02-20 DIAGNOSIS — G40909 Epilepsy, unspecified, not intractable, without status epilepticus: Secondary | ICD-10-CM | POA: Diagnosis not present

## 2020-02-20 DIAGNOSIS — Z7401 Bed confinement status: Secondary | ICD-10-CM | POA: Diagnosis not present

## 2020-02-20 DIAGNOSIS — G8250 Quadriplegia, unspecified: Secondary | ICD-10-CM | POA: Diagnosis not present

## 2020-02-20 DIAGNOSIS — I10 Essential (primary) hypertension: Secondary | ICD-10-CM | POA: Diagnosis not present

## 2020-02-20 DIAGNOSIS — Z86718 Personal history of other venous thrombosis and embolism: Secondary | ICD-10-CM | POA: Diagnosis not present

## 2020-02-20 DIAGNOSIS — Z87440 Personal history of urinary (tract) infections: Secondary | ICD-10-CM | POA: Diagnosis not present

## 2020-02-20 DIAGNOSIS — R339 Retention of urine, unspecified: Secondary | ICD-10-CM | POA: Diagnosis not present

## 2020-02-20 DIAGNOSIS — R000 Tachycardia, unspecified: Secondary | ICD-10-CM | POA: Diagnosis not present

## 2020-02-20 DIAGNOSIS — E1165 Type 2 diabetes mellitus with hyperglycemia: Secondary | ICD-10-CM | POA: Diagnosis not present

## 2020-02-20 DIAGNOSIS — Z9181 History of falling: Secondary | ICD-10-CM | POA: Diagnosis not present

## 2020-02-20 DIAGNOSIS — Z6841 Body Mass Index (BMI) 40.0 and over, adult: Secondary | ICD-10-CM | POA: Diagnosis not present

## 2020-02-20 DIAGNOSIS — I69365 Other paralytic syndrome following cerebral infarction, bilateral: Secondary | ICD-10-CM | POA: Diagnosis not present

## 2020-02-20 DIAGNOSIS — I69320 Aphasia following cerebral infarction: Secondary | ICD-10-CM | POA: Diagnosis not present

## 2020-02-20 DIAGNOSIS — D649 Anemia, unspecified: Secondary | ICD-10-CM | POA: Diagnosis not present

## 2020-02-25 DIAGNOSIS — Z6841 Body Mass Index (BMI) 40.0 and over, adult: Secondary | ICD-10-CM | POA: Diagnosis not present

## 2020-02-25 DIAGNOSIS — G8250 Quadriplegia, unspecified: Secondary | ICD-10-CM | POA: Diagnosis not present

## 2020-02-25 DIAGNOSIS — I69365 Other paralytic syndrome following cerebral infarction, bilateral: Secondary | ICD-10-CM | POA: Diagnosis not present

## 2020-02-25 DIAGNOSIS — R339 Retention of urine, unspecified: Secondary | ICD-10-CM | POA: Diagnosis not present

## 2020-02-25 DIAGNOSIS — Z87440 Personal history of urinary (tract) infections: Secondary | ICD-10-CM | POA: Diagnosis not present

## 2020-02-25 DIAGNOSIS — Z9181 History of falling: Secondary | ICD-10-CM | POA: Diagnosis not present

## 2020-02-25 DIAGNOSIS — I69320 Aphasia following cerebral infarction: Secondary | ICD-10-CM | POA: Diagnosis not present

## 2020-02-25 DIAGNOSIS — R000 Tachycardia, unspecified: Secondary | ICD-10-CM | POA: Diagnosis not present

## 2020-02-25 DIAGNOSIS — D649 Anemia, unspecified: Secondary | ICD-10-CM | POA: Diagnosis not present

## 2020-02-25 DIAGNOSIS — Z86718 Personal history of other venous thrombosis and embolism: Secondary | ICD-10-CM | POA: Diagnosis not present

## 2020-02-25 DIAGNOSIS — G40909 Epilepsy, unspecified, not intractable, without status epilepticus: Secondary | ICD-10-CM | POA: Diagnosis not present

## 2020-02-25 DIAGNOSIS — E1165 Type 2 diabetes mellitus with hyperglycemia: Secondary | ICD-10-CM | POA: Diagnosis not present

## 2020-02-25 DIAGNOSIS — I10 Essential (primary) hypertension: Secondary | ICD-10-CM | POA: Diagnosis not present

## 2020-02-25 DIAGNOSIS — Z7401 Bed confinement status: Secondary | ICD-10-CM | POA: Diagnosis not present

## 2020-02-25 DIAGNOSIS — G4733 Obstructive sleep apnea (adult) (pediatric): Secondary | ICD-10-CM | POA: Diagnosis not present

## 2020-02-29 DIAGNOSIS — I69365 Other paralytic syndrome following cerebral infarction, bilateral: Secondary | ICD-10-CM | POA: Diagnosis not present

## 2020-02-29 DIAGNOSIS — R000 Tachycardia, unspecified: Secondary | ICD-10-CM | POA: Diagnosis not present

## 2020-02-29 DIAGNOSIS — I69320 Aphasia following cerebral infarction: Secondary | ICD-10-CM | POA: Diagnosis not present

## 2020-02-29 DIAGNOSIS — G4733 Obstructive sleep apnea (adult) (pediatric): Secondary | ICD-10-CM | POA: Diagnosis not present

## 2020-02-29 DIAGNOSIS — I10 Essential (primary) hypertension: Secondary | ICD-10-CM | POA: Diagnosis not present

## 2020-02-29 DIAGNOSIS — Z9181 History of falling: Secondary | ICD-10-CM | POA: Diagnosis not present

## 2020-02-29 DIAGNOSIS — E1165 Type 2 diabetes mellitus with hyperglycemia: Secondary | ICD-10-CM | POA: Diagnosis not present

## 2020-02-29 DIAGNOSIS — Z87440 Personal history of urinary (tract) infections: Secondary | ICD-10-CM | POA: Diagnosis not present

## 2020-02-29 DIAGNOSIS — Z7401 Bed confinement status: Secondary | ICD-10-CM | POA: Diagnosis not present

## 2020-02-29 DIAGNOSIS — T8189XA Other complications of procedures, not elsewhere classified, initial encounter: Secondary | ICD-10-CM | POA: Diagnosis not present

## 2020-02-29 DIAGNOSIS — G8250 Quadriplegia, unspecified: Secondary | ICD-10-CM | POA: Diagnosis not present

## 2020-02-29 DIAGNOSIS — R339 Retention of urine, unspecified: Secondary | ICD-10-CM | POA: Diagnosis not present

## 2020-02-29 DIAGNOSIS — Z86718 Personal history of other venous thrombosis and embolism: Secondary | ICD-10-CM | POA: Diagnosis not present

## 2020-02-29 DIAGNOSIS — D649 Anemia, unspecified: Secondary | ICD-10-CM | POA: Diagnosis not present

## 2020-02-29 DIAGNOSIS — Z6841 Body Mass Index (BMI) 40.0 and over, adult: Secondary | ICD-10-CM | POA: Diagnosis not present

## 2020-02-29 DIAGNOSIS — G40909 Epilepsy, unspecified, not intractable, without status epilepticus: Secondary | ICD-10-CM | POA: Diagnosis not present

## 2020-03-03 DIAGNOSIS — R000 Tachycardia, unspecified: Secondary | ICD-10-CM | POA: Diagnosis not present

## 2020-03-03 DIAGNOSIS — R262 Difficulty in walking, not elsewhere classified: Secondary | ICD-10-CM | POA: Diagnosis not present

## 2020-03-03 DIAGNOSIS — Z7401 Bed confinement status: Secondary | ICD-10-CM | POA: Diagnosis not present

## 2020-03-03 DIAGNOSIS — G40909 Epilepsy, unspecified, not intractable, without status epilepticus: Secondary | ICD-10-CM | POA: Diagnosis not present

## 2020-03-03 DIAGNOSIS — E1165 Type 2 diabetes mellitus with hyperglycemia: Secondary | ICD-10-CM | POA: Diagnosis not present

## 2020-03-03 DIAGNOSIS — D649 Anemia, unspecified: Secondary | ICD-10-CM | POA: Diagnosis not present

## 2020-03-03 DIAGNOSIS — G8250 Quadriplegia, unspecified: Secondary | ICD-10-CM | POA: Diagnosis not present

## 2020-03-03 DIAGNOSIS — R339 Retention of urine, unspecified: Secondary | ICD-10-CM | POA: Diagnosis not present

## 2020-03-03 DIAGNOSIS — I10 Essential (primary) hypertension: Secondary | ICD-10-CM | POA: Diagnosis not present

## 2020-03-03 DIAGNOSIS — I69320 Aphasia following cerebral infarction: Secondary | ICD-10-CM | POA: Diagnosis not present

## 2020-03-03 DIAGNOSIS — Z6841 Body Mass Index (BMI) 40.0 and over, adult: Secondary | ICD-10-CM | POA: Diagnosis not present

## 2020-03-03 DIAGNOSIS — Z9181 History of falling: Secondary | ICD-10-CM | POA: Diagnosis not present

## 2020-03-03 DIAGNOSIS — I69365 Other paralytic syndrome following cerebral infarction, bilateral: Secondary | ICD-10-CM | POA: Diagnosis not present

## 2020-03-03 DIAGNOSIS — Z86718 Personal history of other venous thrombosis and embolism: Secondary | ICD-10-CM | POA: Diagnosis not present

## 2020-03-03 DIAGNOSIS — G4733 Obstructive sleep apnea (adult) (pediatric): Secondary | ICD-10-CM | POA: Diagnosis not present

## 2020-03-03 DIAGNOSIS — I6389 Other cerebral infarction: Secondary | ICD-10-CM | POA: Diagnosis not present

## 2020-03-03 DIAGNOSIS — R2689 Other abnormalities of gait and mobility: Secondary | ICD-10-CM | POA: Diagnosis not present

## 2020-03-03 DIAGNOSIS — Z87440 Personal history of urinary (tract) infections: Secondary | ICD-10-CM | POA: Diagnosis not present

## 2020-03-05 DIAGNOSIS — Z9181 History of falling: Secondary | ICD-10-CM | POA: Diagnosis not present

## 2020-03-05 DIAGNOSIS — I69320 Aphasia following cerebral infarction: Secondary | ICD-10-CM | POA: Diagnosis not present

## 2020-03-05 DIAGNOSIS — Z87440 Personal history of urinary (tract) infections: Secondary | ICD-10-CM | POA: Diagnosis not present

## 2020-03-05 DIAGNOSIS — Z86718 Personal history of other venous thrombosis and embolism: Secondary | ICD-10-CM | POA: Diagnosis not present

## 2020-03-05 DIAGNOSIS — Z7401 Bed confinement status: Secondary | ICD-10-CM | POA: Diagnosis not present

## 2020-03-05 DIAGNOSIS — I69365 Other paralytic syndrome following cerebral infarction, bilateral: Secondary | ICD-10-CM | POA: Diagnosis not present

## 2020-03-05 DIAGNOSIS — D649 Anemia, unspecified: Secondary | ICD-10-CM | POA: Diagnosis not present

## 2020-03-05 DIAGNOSIS — Z6841 Body Mass Index (BMI) 40.0 and over, adult: Secondary | ICD-10-CM | POA: Diagnosis not present

## 2020-03-05 DIAGNOSIS — G40909 Epilepsy, unspecified, not intractable, without status epilepticus: Secondary | ICD-10-CM | POA: Diagnosis not present

## 2020-03-05 DIAGNOSIS — I10 Essential (primary) hypertension: Secondary | ICD-10-CM | POA: Diagnosis not present

## 2020-03-05 DIAGNOSIS — G8250 Quadriplegia, unspecified: Secondary | ICD-10-CM | POA: Diagnosis not present

## 2020-03-05 DIAGNOSIS — R339 Retention of urine, unspecified: Secondary | ICD-10-CM | POA: Diagnosis not present

## 2020-03-05 DIAGNOSIS — G4733 Obstructive sleep apnea (adult) (pediatric): Secondary | ICD-10-CM | POA: Diagnosis not present

## 2020-03-05 DIAGNOSIS — R000 Tachycardia, unspecified: Secondary | ICD-10-CM | POA: Diagnosis not present

## 2020-03-05 DIAGNOSIS — E1165 Type 2 diabetes mellitus with hyperglycemia: Secondary | ICD-10-CM | POA: Diagnosis not present

## 2020-03-06 DIAGNOSIS — G40909 Epilepsy, unspecified, not intractable, without status epilepticus: Secondary | ICD-10-CM | POA: Diagnosis not present

## 2020-03-06 DIAGNOSIS — Z9181 History of falling: Secondary | ICD-10-CM | POA: Diagnosis not present

## 2020-03-06 DIAGNOSIS — G4733 Obstructive sleep apnea (adult) (pediatric): Secondary | ICD-10-CM | POA: Diagnosis not present

## 2020-03-06 DIAGNOSIS — D649 Anemia, unspecified: Secondary | ICD-10-CM | POA: Diagnosis not present

## 2020-03-06 DIAGNOSIS — E1165 Type 2 diabetes mellitus with hyperglycemia: Secondary | ICD-10-CM | POA: Diagnosis not present

## 2020-03-06 DIAGNOSIS — L8994 Pressure ulcer of unspecified site, stage 4: Secondary | ICD-10-CM | POA: Diagnosis not present

## 2020-03-06 DIAGNOSIS — Z6841 Body Mass Index (BMI) 40.0 and over, adult: Secondary | ICD-10-CM | POA: Diagnosis not present

## 2020-03-06 DIAGNOSIS — Z86718 Personal history of other venous thrombosis and embolism: Secondary | ICD-10-CM | POA: Diagnosis not present

## 2020-03-06 DIAGNOSIS — I6389 Other cerebral infarction: Secondary | ICD-10-CM | POA: Diagnosis not present

## 2020-03-06 DIAGNOSIS — R339 Retention of urine, unspecified: Secondary | ICD-10-CM | POA: Diagnosis not present

## 2020-03-06 DIAGNOSIS — I10 Essential (primary) hypertension: Secondary | ICD-10-CM | POA: Diagnosis not present

## 2020-03-06 DIAGNOSIS — Z87440 Personal history of urinary (tract) infections: Secondary | ICD-10-CM | POA: Diagnosis not present

## 2020-03-06 DIAGNOSIS — Z7401 Bed confinement status: Secondary | ICD-10-CM | POA: Diagnosis not present

## 2020-03-06 DIAGNOSIS — G8250 Quadriplegia, unspecified: Secondary | ICD-10-CM | POA: Diagnosis not present

## 2020-03-06 DIAGNOSIS — I69365 Other paralytic syndrome following cerebral infarction, bilateral: Secondary | ICD-10-CM | POA: Diagnosis not present

## 2020-03-06 DIAGNOSIS — R000 Tachycardia, unspecified: Secondary | ICD-10-CM | POA: Diagnosis not present

## 2020-03-06 DIAGNOSIS — I69320 Aphasia following cerebral infarction: Secondary | ICD-10-CM | POA: Diagnosis not present

## 2020-03-08 DIAGNOSIS — T8189XA Other complications of procedures, not elsewhere classified, initial encounter: Secondary | ICD-10-CM | POA: Diagnosis not present

## 2020-03-08 DIAGNOSIS — L89154 Pressure ulcer of sacral region, stage 4: Secondary | ICD-10-CM | POA: Diagnosis not present

## 2020-03-09 DIAGNOSIS — R339 Retention of urine, unspecified: Secondary | ICD-10-CM | POA: Diagnosis not present

## 2020-03-09 DIAGNOSIS — Z7401 Bed confinement status: Secondary | ICD-10-CM | POA: Diagnosis not present

## 2020-03-09 DIAGNOSIS — E1165 Type 2 diabetes mellitus with hyperglycemia: Secondary | ICD-10-CM | POA: Diagnosis not present

## 2020-03-09 DIAGNOSIS — G4733 Obstructive sleep apnea (adult) (pediatric): Secondary | ICD-10-CM | POA: Diagnosis not present

## 2020-03-09 DIAGNOSIS — G40909 Epilepsy, unspecified, not intractable, without status epilepticus: Secondary | ICD-10-CM | POA: Diagnosis not present

## 2020-03-09 DIAGNOSIS — Z9181 History of falling: Secondary | ICD-10-CM | POA: Diagnosis not present

## 2020-03-09 DIAGNOSIS — I69320 Aphasia following cerebral infarction: Secondary | ICD-10-CM | POA: Diagnosis not present

## 2020-03-09 DIAGNOSIS — G8250 Quadriplegia, unspecified: Secondary | ICD-10-CM | POA: Diagnosis not present

## 2020-03-09 DIAGNOSIS — Z86718 Personal history of other venous thrombosis and embolism: Secondary | ICD-10-CM | POA: Diagnosis not present

## 2020-03-09 DIAGNOSIS — Z87440 Personal history of urinary (tract) infections: Secondary | ICD-10-CM | POA: Diagnosis not present

## 2020-03-09 DIAGNOSIS — I10 Essential (primary) hypertension: Secondary | ICD-10-CM | POA: Diagnosis not present

## 2020-03-09 DIAGNOSIS — R000 Tachycardia, unspecified: Secondary | ICD-10-CM | POA: Diagnosis not present

## 2020-03-09 DIAGNOSIS — D649 Anemia, unspecified: Secondary | ICD-10-CM | POA: Diagnosis not present

## 2020-03-09 DIAGNOSIS — Z6841 Body Mass Index (BMI) 40.0 and over, adult: Secondary | ICD-10-CM | POA: Diagnosis not present

## 2020-03-09 DIAGNOSIS — I69365 Other paralytic syndrome following cerebral infarction, bilateral: Secondary | ICD-10-CM | POA: Diagnosis not present

## 2020-03-10 DIAGNOSIS — I6389 Other cerebral infarction: Secondary | ICD-10-CM | POA: Diagnosis not present

## 2020-03-10 DIAGNOSIS — G4733 Obstructive sleep apnea (adult) (pediatric): Secondary | ICD-10-CM | POA: Diagnosis not present

## 2020-03-10 DIAGNOSIS — L8994 Pressure ulcer of unspecified site, stage 4: Secondary | ICD-10-CM | POA: Diagnosis not present

## 2020-03-11 DIAGNOSIS — Z87440 Personal history of urinary (tract) infections: Secondary | ICD-10-CM | POA: Diagnosis not present

## 2020-03-11 DIAGNOSIS — R339 Retention of urine, unspecified: Secondary | ICD-10-CM | POA: Diagnosis not present

## 2020-03-11 DIAGNOSIS — Z86718 Personal history of other venous thrombosis and embolism: Secondary | ICD-10-CM | POA: Diagnosis not present

## 2020-03-11 DIAGNOSIS — I69320 Aphasia following cerebral infarction: Secondary | ICD-10-CM | POA: Diagnosis not present

## 2020-03-11 DIAGNOSIS — E1165 Type 2 diabetes mellitus with hyperglycemia: Secondary | ICD-10-CM | POA: Diagnosis not present

## 2020-03-11 DIAGNOSIS — G4733 Obstructive sleep apnea (adult) (pediatric): Secondary | ICD-10-CM | POA: Diagnosis not present

## 2020-03-11 DIAGNOSIS — Z6841 Body Mass Index (BMI) 40.0 and over, adult: Secondary | ICD-10-CM | POA: Diagnosis not present

## 2020-03-11 DIAGNOSIS — G8250 Quadriplegia, unspecified: Secondary | ICD-10-CM | POA: Diagnosis not present

## 2020-03-11 DIAGNOSIS — I69365 Other paralytic syndrome following cerebral infarction, bilateral: Secondary | ICD-10-CM | POA: Diagnosis not present

## 2020-03-11 DIAGNOSIS — G40909 Epilepsy, unspecified, not intractable, without status epilepticus: Secondary | ICD-10-CM | POA: Diagnosis not present

## 2020-03-11 DIAGNOSIS — D649 Anemia, unspecified: Secondary | ICD-10-CM | POA: Diagnosis not present

## 2020-03-11 DIAGNOSIS — Z7401 Bed confinement status: Secondary | ICD-10-CM | POA: Diagnosis not present

## 2020-03-11 DIAGNOSIS — R000 Tachycardia, unspecified: Secondary | ICD-10-CM | POA: Diagnosis not present

## 2020-03-11 DIAGNOSIS — Z9181 History of falling: Secondary | ICD-10-CM | POA: Diagnosis not present

## 2020-03-11 DIAGNOSIS — I10 Essential (primary) hypertension: Secondary | ICD-10-CM | POA: Diagnosis not present

## 2020-03-13 DIAGNOSIS — I6389 Other cerebral infarction: Secondary | ICD-10-CM | POA: Diagnosis not present

## 2020-03-13 DIAGNOSIS — G4733 Obstructive sleep apnea (adult) (pediatric): Secondary | ICD-10-CM | POA: Diagnosis not present

## 2020-03-13 DIAGNOSIS — L8994 Pressure ulcer of unspecified site, stage 4: Secondary | ICD-10-CM | POA: Diagnosis not present

## 2020-03-16 DIAGNOSIS — I10 Essential (primary) hypertension: Secondary | ICD-10-CM | POA: Diagnosis not present

## 2020-03-16 DIAGNOSIS — D649 Anemia, unspecified: Secondary | ICD-10-CM | POA: Diagnosis not present

## 2020-03-16 DIAGNOSIS — E1165 Type 2 diabetes mellitus with hyperglycemia: Secondary | ICD-10-CM | POA: Diagnosis not present

## 2020-03-16 DIAGNOSIS — G8250 Quadriplegia, unspecified: Secondary | ICD-10-CM | POA: Diagnosis not present

## 2020-03-16 DIAGNOSIS — G4733 Obstructive sleep apnea (adult) (pediatric): Secondary | ICD-10-CM | POA: Diagnosis not present

## 2020-03-16 DIAGNOSIS — Z87440 Personal history of urinary (tract) infections: Secondary | ICD-10-CM | POA: Diagnosis not present

## 2020-03-16 DIAGNOSIS — Z9181 History of falling: Secondary | ICD-10-CM | POA: Diagnosis not present

## 2020-03-16 DIAGNOSIS — I69365 Other paralytic syndrome following cerebral infarction, bilateral: Secondary | ICD-10-CM | POA: Diagnosis not present

## 2020-03-16 DIAGNOSIS — G40909 Epilepsy, unspecified, not intractable, without status epilepticus: Secondary | ICD-10-CM | POA: Diagnosis not present

## 2020-03-16 DIAGNOSIS — R000 Tachycardia, unspecified: Secondary | ICD-10-CM | POA: Diagnosis not present

## 2020-03-16 DIAGNOSIS — Z86718 Personal history of other venous thrombosis and embolism: Secondary | ICD-10-CM | POA: Diagnosis not present

## 2020-03-16 DIAGNOSIS — Z7401 Bed confinement status: Secondary | ICD-10-CM | POA: Diagnosis not present

## 2020-03-16 DIAGNOSIS — Z6841 Body Mass Index (BMI) 40.0 and over, adult: Secondary | ICD-10-CM | POA: Diagnosis not present

## 2020-03-16 DIAGNOSIS — R339 Retention of urine, unspecified: Secondary | ICD-10-CM | POA: Diagnosis not present

## 2020-03-16 DIAGNOSIS — I69320 Aphasia following cerebral infarction: Secondary | ICD-10-CM | POA: Diagnosis not present

## 2020-03-18 DIAGNOSIS — Z87440 Personal history of urinary (tract) infections: Secondary | ICD-10-CM | POA: Diagnosis not present

## 2020-03-18 DIAGNOSIS — Z6841 Body Mass Index (BMI) 40.0 and over, adult: Secondary | ICD-10-CM | POA: Diagnosis not present

## 2020-03-18 DIAGNOSIS — I10 Essential (primary) hypertension: Secondary | ICD-10-CM | POA: Diagnosis not present

## 2020-03-18 DIAGNOSIS — I69320 Aphasia following cerebral infarction: Secondary | ICD-10-CM | POA: Diagnosis not present

## 2020-03-18 DIAGNOSIS — I69365 Other paralytic syndrome following cerebral infarction, bilateral: Secondary | ICD-10-CM | POA: Diagnosis not present

## 2020-03-18 DIAGNOSIS — D649 Anemia, unspecified: Secondary | ICD-10-CM | POA: Diagnosis not present

## 2020-03-18 DIAGNOSIS — Z86718 Personal history of other venous thrombosis and embolism: Secondary | ICD-10-CM | POA: Diagnosis not present

## 2020-03-18 DIAGNOSIS — R339 Retention of urine, unspecified: Secondary | ICD-10-CM | POA: Diagnosis not present

## 2020-03-18 DIAGNOSIS — G4733 Obstructive sleep apnea (adult) (pediatric): Secondary | ICD-10-CM | POA: Diagnosis not present

## 2020-03-18 DIAGNOSIS — E1165 Type 2 diabetes mellitus with hyperglycemia: Secondary | ICD-10-CM | POA: Diagnosis not present

## 2020-03-18 DIAGNOSIS — Z7401 Bed confinement status: Secondary | ICD-10-CM | POA: Diagnosis not present

## 2020-03-18 DIAGNOSIS — G8250 Quadriplegia, unspecified: Secondary | ICD-10-CM | POA: Diagnosis not present

## 2020-03-18 DIAGNOSIS — G40909 Epilepsy, unspecified, not intractable, without status epilepticus: Secondary | ICD-10-CM | POA: Diagnosis not present

## 2020-03-18 DIAGNOSIS — R000 Tachycardia, unspecified: Secondary | ICD-10-CM | POA: Diagnosis not present

## 2020-03-18 DIAGNOSIS — Z9181 History of falling: Secondary | ICD-10-CM | POA: Diagnosis not present

## 2020-03-23 DIAGNOSIS — Z9181 History of falling: Secondary | ICD-10-CM | POA: Diagnosis not present

## 2020-03-23 DIAGNOSIS — E1165 Type 2 diabetes mellitus with hyperglycemia: Secondary | ICD-10-CM | POA: Diagnosis not present

## 2020-03-23 DIAGNOSIS — I69365 Other paralytic syndrome following cerebral infarction, bilateral: Secondary | ICD-10-CM | POA: Diagnosis not present

## 2020-03-23 DIAGNOSIS — Z87440 Personal history of urinary (tract) infections: Secondary | ICD-10-CM | POA: Diagnosis not present

## 2020-03-23 DIAGNOSIS — G4733 Obstructive sleep apnea (adult) (pediatric): Secondary | ICD-10-CM | POA: Diagnosis not present

## 2020-03-23 DIAGNOSIS — G40909 Epilepsy, unspecified, not intractable, without status epilepticus: Secondary | ICD-10-CM | POA: Diagnosis not present

## 2020-03-23 DIAGNOSIS — D649 Anemia, unspecified: Secondary | ICD-10-CM | POA: Diagnosis not present

## 2020-03-23 DIAGNOSIS — Z6841 Body Mass Index (BMI) 40.0 and over, adult: Secondary | ICD-10-CM | POA: Diagnosis not present

## 2020-03-23 DIAGNOSIS — R339 Retention of urine, unspecified: Secondary | ICD-10-CM | POA: Diagnosis not present

## 2020-03-23 DIAGNOSIS — I10 Essential (primary) hypertension: Secondary | ICD-10-CM | POA: Diagnosis not present

## 2020-03-23 DIAGNOSIS — Z7401 Bed confinement status: Secondary | ICD-10-CM | POA: Diagnosis not present

## 2020-03-23 DIAGNOSIS — G8250 Quadriplegia, unspecified: Secondary | ICD-10-CM | POA: Diagnosis not present

## 2020-03-23 DIAGNOSIS — I69320 Aphasia following cerebral infarction: Secondary | ICD-10-CM | POA: Diagnosis not present

## 2020-03-23 DIAGNOSIS — R000 Tachycardia, unspecified: Secondary | ICD-10-CM | POA: Diagnosis not present

## 2020-03-23 DIAGNOSIS — Z86718 Personal history of other venous thrombosis and embolism: Secondary | ICD-10-CM | POA: Diagnosis not present

## 2020-03-26 DIAGNOSIS — Z87440 Personal history of urinary (tract) infections: Secondary | ICD-10-CM | POA: Diagnosis not present

## 2020-03-26 DIAGNOSIS — G40909 Epilepsy, unspecified, not intractable, without status epilepticus: Secondary | ICD-10-CM | POA: Diagnosis not present

## 2020-03-26 DIAGNOSIS — I69365 Other paralytic syndrome following cerebral infarction, bilateral: Secondary | ICD-10-CM | POA: Diagnosis not present

## 2020-03-26 DIAGNOSIS — G4733 Obstructive sleep apnea (adult) (pediatric): Secondary | ICD-10-CM | POA: Diagnosis not present

## 2020-03-26 DIAGNOSIS — E1165 Type 2 diabetes mellitus with hyperglycemia: Secondary | ICD-10-CM | POA: Diagnosis not present

## 2020-03-26 DIAGNOSIS — G8250 Quadriplegia, unspecified: Secondary | ICD-10-CM | POA: Diagnosis not present

## 2020-03-26 DIAGNOSIS — R000 Tachycardia, unspecified: Secondary | ICD-10-CM | POA: Diagnosis not present

## 2020-03-26 DIAGNOSIS — D649 Anemia, unspecified: Secondary | ICD-10-CM | POA: Diagnosis not present

## 2020-03-26 DIAGNOSIS — Z7401 Bed confinement status: Secondary | ICD-10-CM | POA: Diagnosis not present

## 2020-03-26 DIAGNOSIS — R339 Retention of urine, unspecified: Secondary | ICD-10-CM | POA: Diagnosis not present

## 2020-03-26 DIAGNOSIS — Z86718 Personal history of other venous thrombosis and embolism: Secondary | ICD-10-CM | POA: Diagnosis not present

## 2020-03-26 DIAGNOSIS — Z6841 Body Mass Index (BMI) 40.0 and over, adult: Secondary | ICD-10-CM | POA: Diagnosis not present

## 2020-03-26 DIAGNOSIS — Z9181 History of falling: Secondary | ICD-10-CM | POA: Diagnosis not present

## 2020-03-26 DIAGNOSIS — I69320 Aphasia following cerebral infarction: Secondary | ICD-10-CM | POA: Diagnosis not present

## 2020-03-26 DIAGNOSIS — I10 Essential (primary) hypertension: Secondary | ICD-10-CM | POA: Diagnosis not present

## 2020-03-31 DIAGNOSIS — Z86718 Personal history of other venous thrombosis and embolism: Secondary | ICD-10-CM | POA: Diagnosis not present

## 2020-03-31 DIAGNOSIS — R339 Retention of urine, unspecified: Secondary | ICD-10-CM | POA: Diagnosis not present

## 2020-03-31 DIAGNOSIS — Z7401 Bed confinement status: Secondary | ICD-10-CM | POA: Diagnosis not present

## 2020-03-31 DIAGNOSIS — Z87440 Personal history of urinary (tract) infections: Secondary | ICD-10-CM | POA: Diagnosis not present

## 2020-03-31 DIAGNOSIS — G40909 Epilepsy, unspecified, not intractable, without status epilepticus: Secondary | ICD-10-CM | POA: Diagnosis not present

## 2020-03-31 DIAGNOSIS — I6931 Attention and concentration deficit following cerebral infarction: Secondary | ICD-10-CM | POA: Diagnosis not present

## 2020-03-31 DIAGNOSIS — G4733 Obstructive sleep apnea (adult) (pediatric): Secondary | ICD-10-CM | POA: Diagnosis not present

## 2020-03-31 DIAGNOSIS — G8250 Quadriplegia, unspecified: Secondary | ICD-10-CM | POA: Diagnosis not present

## 2020-03-31 DIAGNOSIS — I69320 Aphasia following cerebral infarction: Secondary | ICD-10-CM | POA: Diagnosis not present

## 2020-03-31 DIAGNOSIS — E1165 Type 2 diabetes mellitus with hyperglycemia: Secondary | ICD-10-CM | POA: Diagnosis not present

## 2020-03-31 DIAGNOSIS — I69311 Memory deficit following cerebral infarction: Secondary | ICD-10-CM | POA: Diagnosis not present

## 2020-03-31 DIAGNOSIS — R000 Tachycardia, unspecified: Secondary | ICD-10-CM | POA: Diagnosis not present

## 2020-03-31 DIAGNOSIS — I69365 Other paralytic syndrome following cerebral infarction, bilateral: Secondary | ICD-10-CM | POA: Diagnosis not present

## 2020-03-31 DIAGNOSIS — D649 Anemia, unspecified: Secondary | ICD-10-CM | POA: Diagnosis not present

## 2020-03-31 DIAGNOSIS — I10 Essential (primary) hypertension: Secondary | ICD-10-CM | POA: Diagnosis not present

## 2020-04-02 DIAGNOSIS — Z86718 Personal history of other venous thrombosis and embolism: Secondary | ICD-10-CM | POA: Diagnosis not present

## 2020-04-02 DIAGNOSIS — I10 Essential (primary) hypertension: Secondary | ICD-10-CM | POA: Diagnosis not present

## 2020-04-02 DIAGNOSIS — I6389 Other cerebral infarction: Secondary | ICD-10-CM | POA: Diagnosis not present

## 2020-04-02 DIAGNOSIS — I6931 Attention and concentration deficit following cerebral infarction: Secondary | ICD-10-CM | POA: Diagnosis not present

## 2020-04-02 DIAGNOSIS — G40909 Epilepsy, unspecified, not intractable, without status epilepticus: Secondary | ICD-10-CM | POA: Diagnosis not present

## 2020-04-02 DIAGNOSIS — Z87440 Personal history of urinary (tract) infections: Secondary | ICD-10-CM | POA: Diagnosis not present

## 2020-04-02 DIAGNOSIS — I69365 Other paralytic syndrome following cerebral infarction, bilateral: Secondary | ICD-10-CM | POA: Diagnosis not present

## 2020-04-02 DIAGNOSIS — G4733 Obstructive sleep apnea (adult) (pediatric): Secondary | ICD-10-CM | POA: Diagnosis not present

## 2020-04-02 DIAGNOSIS — R2689 Other abnormalities of gait and mobility: Secondary | ICD-10-CM | POA: Diagnosis not present

## 2020-04-02 DIAGNOSIS — I69311 Memory deficit following cerebral infarction: Secondary | ICD-10-CM | POA: Diagnosis not present

## 2020-04-02 DIAGNOSIS — R339 Retention of urine, unspecified: Secondary | ICD-10-CM | POA: Diagnosis not present

## 2020-04-02 DIAGNOSIS — G8250 Quadriplegia, unspecified: Secondary | ICD-10-CM | POA: Diagnosis not present

## 2020-04-02 DIAGNOSIS — D649 Anemia, unspecified: Secondary | ICD-10-CM | POA: Diagnosis not present

## 2020-04-02 DIAGNOSIS — R262 Difficulty in walking, not elsewhere classified: Secondary | ICD-10-CM | POA: Diagnosis not present

## 2020-04-02 DIAGNOSIS — I69320 Aphasia following cerebral infarction: Secondary | ICD-10-CM | POA: Diagnosis not present

## 2020-04-02 DIAGNOSIS — R000 Tachycardia, unspecified: Secondary | ICD-10-CM | POA: Diagnosis not present

## 2020-04-02 DIAGNOSIS — E1165 Type 2 diabetes mellitus with hyperglycemia: Secondary | ICD-10-CM | POA: Diagnosis not present

## 2020-04-02 DIAGNOSIS — Z7401 Bed confinement status: Secondary | ICD-10-CM | POA: Diagnosis not present

## 2020-04-03 DIAGNOSIS — I6931 Attention and concentration deficit following cerebral infarction: Secondary | ICD-10-CM | POA: Diagnosis not present

## 2020-04-03 DIAGNOSIS — I69311 Memory deficit following cerebral infarction: Secondary | ICD-10-CM | POA: Diagnosis not present

## 2020-04-03 DIAGNOSIS — G4733 Obstructive sleep apnea (adult) (pediatric): Secondary | ICD-10-CM | POA: Diagnosis not present

## 2020-04-03 DIAGNOSIS — R000 Tachycardia, unspecified: Secondary | ICD-10-CM | POA: Diagnosis not present

## 2020-04-03 DIAGNOSIS — R339 Retention of urine, unspecified: Secondary | ICD-10-CM | POA: Diagnosis not present

## 2020-04-03 DIAGNOSIS — E1165 Type 2 diabetes mellitus with hyperglycemia: Secondary | ICD-10-CM | POA: Diagnosis not present

## 2020-04-03 DIAGNOSIS — D649 Anemia, unspecified: Secondary | ICD-10-CM | POA: Diagnosis not present

## 2020-04-03 DIAGNOSIS — Z87440 Personal history of urinary (tract) infections: Secondary | ICD-10-CM | POA: Diagnosis not present

## 2020-04-03 DIAGNOSIS — I10 Essential (primary) hypertension: Secondary | ICD-10-CM | POA: Diagnosis not present

## 2020-04-03 DIAGNOSIS — Z7401 Bed confinement status: Secondary | ICD-10-CM | POA: Diagnosis not present

## 2020-04-03 DIAGNOSIS — I69320 Aphasia following cerebral infarction: Secondary | ICD-10-CM | POA: Diagnosis not present

## 2020-04-03 DIAGNOSIS — G8250 Quadriplegia, unspecified: Secondary | ICD-10-CM | POA: Diagnosis not present

## 2020-04-03 DIAGNOSIS — Z86718 Personal history of other venous thrombosis and embolism: Secondary | ICD-10-CM | POA: Diagnosis not present

## 2020-04-03 DIAGNOSIS — I69365 Other paralytic syndrome following cerebral infarction, bilateral: Secondary | ICD-10-CM | POA: Diagnosis not present

## 2020-04-03 DIAGNOSIS — G40909 Epilepsy, unspecified, not intractable, without status epilepticus: Secondary | ICD-10-CM | POA: Diagnosis not present

## 2020-04-07 DIAGNOSIS — Z87440 Personal history of urinary (tract) infections: Secondary | ICD-10-CM | POA: Diagnosis not present

## 2020-04-07 DIAGNOSIS — I69320 Aphasia following cerebral infarction: Secondary | ICD-10-CM | POA: Diagnosis not present

## 2020-04-07 DIAGNOSIS — G8250 Quadriplegia, unspecified: Secondary | ICD-10-CM | POA: Diagnosis not present

## 2020-04-07 DIAGNOSIS — I10 Essential (primary) hypertension: Secondary | ICD-10-CM | POA: Diagnosis not present

## 2020-04-07 DIAGNOSIS — R000 Tachycardia, unspecified: Secondary | ICD-10-CM | POA: Diagnosis not present

## 2020-04-07 DIAGNOSIS — D649 Anemia, unspecified: Secondary | ICD-10-CM | POA: Diagnosis not present

## 2020-04-07 DIAGNOSIS — I6931 Attention and concentration deficit following cerebral infarction: Secondary | ICD-10-CM | POA: Diagnosis not present

## 2020-04-07 DIAGNOSIS — Z86718 Personal history of other venous thrombosis and embolism: Secondary | ICD-10-CM | POA: Diagnosis not present

## 2020-04-07 DIAGNOSIS — G40909 Epilepsy, unspecified, not intractable, without status epilepticus: Secondary | ICD-10-CM | POA: Diagnosis not present

## 2020-04-07 DIAGNOSIS — I69365 Other paralytic syndrome following cerebral infarction, bilateral: Secondary | ICD-10-CM | POA: Diagnosis not present

## 2020-04-07 DIAGNOSIS — I69311 Memory deficit following cerebral infarction: Secondary | ICD-10-CM | POA: Diagnosis not present

## 2020-04-07 DIAGNOSIS — Z7401 Bed confinement status: Secondary | ICD-10-CM | POA: Diagnosis not present

## 2020-04-07 DIAGNOSIS — R339 Retention of urine, unspecified: Secondary | ICD-10-CM | POA: Diagnosis not present

## 2020-04-07 DIAGNOSIS — E1165 Type 2 diabetes mellitus with hyperglycemia: Secondary | ICD-10-CM | POA: Diagnosis not present

## 2020-04-07 DIAGNOSIS — G4733 Obstructive sleep apnea (adult) (pediatric): Secondary | ICD-10-CM | POA: Diagnosis not present

## 2020-04-07 DIAGNOSIS — I6389 Other cerebral infarction: Secondary | ICD-10-CM | POA: Diagnosis not present

## 2020-04-07 DIAGNOSIS — L8994 Pressure ulcer of unspecified site, stage 4: Secondary | ICD-10-CM | POA: Diagnosis not present

## 2020-04-08 DIAGNOSIS — L89154 Pressure ulcer of sacral region, stage 4: Secondary | ICD-10-CM | POA: Diagnosis not present

## 2020-04-08 DIAGNOSIS — T8189XA Other complications of procedures, not elsewhere classified, initial encounter: Secondary | ICD-10-CM | POA: Diagnosis not present

## 2020-04-10 DIAGNOSIS — R000 Tachycardia, unspecified: Secondary | ICD-10-CM | POA: Diagnosis not present

## 2020-04-10 DIAGNOSIS — Z7401 Bed confinement status: Secondary | ICD-10-CM | POA: Diagnosis not present

## 2020-04-10 DIAGNOSIS — G40909 Epilepsy, unspecified, not intractable, without status epilepticus: Secondary | ICD-10-CM | POA: Diagnosis not present

## 2020-04-10 DIAGNOSIS — G4733 Obstructive sleep apnea (adult) (pediatric): Secondary | ICD-10-CM | POA: Diagnosis not present

## 2020-04-10 DIAGNOSIS — I69311 Memory deficit following cerebral infarction: Secondary | ICD-10-CM | POA: Diagnosis not present

## 2020-04-10 DIAGNOSIS — G8250 Quadriplegia, unspecified: Secondary | ICD-10-CM | POA: Diagnosis not present

## 2020-04-10 DIAGNOSIS — I6931 Attention and concentration deficit following cerebral infarction: Secondary | ICD-10-CM | POA: Diagnosis not present

## 2020-04-10 DIAGNOSIS — I69320 Aphasia following cerebral infarction: Secondary | ICD-10-CM | POA: Diagnosis not present

## 2020-04-10 DIAGNOSIS — D649 Anemia, unspecified: Secondary | ICD-10-CM | POA: Diagnosis not present

## 2020-04-10 DIAGNOSIS — E1165 Type 2 diabetes mellitus with hyperglycemia: Secondary | ICD-10-CM | POA: Diagnosis not present

## 2020-04-10 DIAGNOSIS — Z86718 Personal history of other venous thrombosis and embolism: Secondary | ICD-10-CM | POA: Diagnosis not present

## 2020-04-10 DIAGNOSIS — I69365 Other paralytic syndrome following cerebral infarction, bilateral: Secondary | ICD-10-CM | POA: Diagnosis not present

## 2020-04-10 DIAGNOSIS — I6389 Other cerebral infarction: Secondary | ICD-10-CM | POA: Diagnosis not present

## 2020-04-10 DIAGNOSIS — I10 Essential (primary) hypertension: Secondary | ICD-10-CM | POA: Diagnosis not present

## 2020-04-10 DIAGNOSIS — Z87440 Personal history of urinary (tract) infections: Secondary | ICD-10-CM | POA: Diagnosis not present

## 2020-04-10 DIAGNOSIS — R339 Retention of urine, unspecified: Secondary | ICD-10-CM | POA: Diagnosis not present

## 2020-04-10 DIAGNOSIS — L8994 Pressure ulcer of unspecified site, stage 4: Secondary | ICD-10-CM | POA: Diagnosis not present

## 2020-04-15 DIAGNOSIS — I69320 Aphasia following cerebral infarction: Secondary | ICD-10-CM | POA: Diagnosis not present

## 2020-04-15 DIAGNOSIS — G4733 Obstructive sleep apnea (adult) (pediatric): Secondary | ICD-10-CM | POA: Diagnosis not present

## 2020-04-15 DIAGNOSIS — I10 Essential (primary) hypertension: Secondary | ICD-10-CM | POA: Diagnosis not present

## 2020-04-15 DIAGNOSIS — E1165 Type 2 diabetes mellitus with hyperglycemia: Secondary | ICD-10-CM | POA: Diagnosis not present

## 2020-04-15 DIAGNOSIS — I6389 Other cerebral infarction: Secondary | ICD-10-CM | POA: Diagnosis not present

## 2020-04-15 DIAGNOSIS — G8250 Quadriplegia, unspecified: Secondary | ICD-10-CM | POA: Diagnosis not present

## 2020-04-15 DIAGNOSIS — Z86718 Personal history of other venous thrombosis and embolism: Secondary | ICD-10-CM | POA: Diagnosis not present

## 2020-04-15 DIAGNOSIS — G40909 Epilepsy, unspecified, not intractable, without status epilepticus: Secondary | ICD-10-CM | POA: Diagnosis not present

## 2020-04-15 DIAGNOSIS — L8994 Pressure ulcer of unspecified site, stage 4: Secondary | ICD-10-CM | POA: Diagnosis not present

## 2020-04-15 DIAGNOSIS — I69365 Other paralytic syndrome following cerebral infarction, bilateral: Secondary | ICD-10-CM | POA: Diagnosis not present

## 2020-04-15 DIAGNOSIS — R339 Retention of urine, unspecified: Secondary | ICD-10-CM | POA: Diagnosis not present

## 2020-04-15 DIAGNOSIS — Z7401 Bed confinement status: Secondary | ICD-10-CM | POA: Diagnosis not present

## 2020-04-15 DIAGNOSIS — D649 Anemia, unspecified: Secondary | ICD-10-CM | POA: Diagnosis not present

## 2020-04-15 DIAGNOSIS — R000 Tachycardia, unspecified: Secondary | ICD-10-CM | POA: Diagnosis not present

## 2020-04-15 DIAGNOSIS — I6931 Attention and concentration deficit following cerebral infarction: Secondary | ICD-10-CM | POA: Diagnosis not present

## 2020-04-15 DIAGNOSIS — Z87440 Personal history of urinary (tract) infections: Secondary | ICD-10-CM | POA: Diagnosis not present

## 2020-04-15 DIAGNOSIS — I69311 Memory deficit following cerebral infarction: Secondary | ICD-10-CM | POA: Diagnosis not present

## 2020-04-17 DIAGNOSIS — G4733 Obstructive sleep apnea (adult) (pediatric): Secondary | ICD-10-CM | POA: Diagnosis not present

## 2020-04-17 DIAGNOSIS — I69320 Aphasia following cerebral infarction: Secondary | ICD-10-CM | POA: Diagnosis not present

## 2020-04-17 DIAGNOSIS — I6931 Attention and concentration deficit following cerebral infarction: Secondary | ICD-10-CM | POA: Diagnosis not present

## 2020-04-17 DIAGNOSIS — G40909 Epilepsy, unspecified, not intractable, without status epilepticus: Secondary | ICD-10-CM | POA: Diagnosis not present

## 2020-04-17 DIAGNOSIS — I69365 Other paralytic syndrome following cerebral infarction, bilateral: Secondary | ICD-10-CM | POA: Diagnosis not present

## 2020-04-17 DIAGNOSIS — I69311 Memory deficit following cerebral infarction: Secondary | ICD-10-CM | POA: Diagnosis not present

## 2020-04-17 DIAGNOSIS — G8250 Quadriplegia, unspecified: Secondary | ICD-10-CM | POA: Diagnosis not present

## 2020-04-17 DIAGNOSIS — I10 Essential (primary) hypertension: Secondary | ICD-10-CM | POA: Diagnosis not present

## 2020-04-17 DIAGNOSIS — Z7401 Bed confinement status: Secondary | ICD-10-CM | POA: Diagnosis not present

## 2020-04-17 DIAGNOSIS — Z87440 Personal history of urinary (tract) infections: Secondary | ICD-10-CM | POA: Diagnosis not present

## 2020-04-17 DIAGNOSIS — R339 Retention of urine, unspecified: Secondary | ICD-10-CM | POA: Diagnosis not present

## 2020-04-17 DIAGNOSIS — D649 Anemia, unspecified: Secondary | ICD-10-CM | POA: Diagnosis not present

## 2020-04-17 DIAGNOSIS — R000 Tachycardia, unspecified: Secondary | ICD-10-CM | POA: Diagnosis not present

## 2020-04-17 DIAGNOSIS — Z86718 Personal history of other venous thrombosis and embolism: Secondary | ICD-10-CM | POA: Diagnosis not present

## 2020-04-17 DIAGNOSIS — E1165 Type 2 diabetes mellitus with hyperglycemia: Secondary | ICD-10-CM | POA: Diagnosis not present

## 2020-04-21 DIAGNOSIS — I69311 Memory deficit following cerebral infarction: Secondary | ICD-10-CM | POA: Diagnosis not present

## 2020-04-21 DIAGNOSIS — D649 Anemia, unspecified: Secondary | ICD-10-CM | POA: Diagnosis not present

## 2020-04-21 DIAGNOSIS — E1165 Type 2 diabetes mellitus with hyperglycemia: Secondary | ICD-10-CM | POA: Diagnosis not present

## 2020-04-21 DIAGNOSIS — G4733 Obstructive sleep apnea (adult) (pediatric): Secondary | ICD-10-CM | POA: Diagnosis not present

## 2020-04-21 DIAGNOSIS — I6931 Attention and concentration deficit following cerebral infarction: Secondary | ICD-10-CM | POA: Diagnosis not present

## 2020-04-21 DIAGNOSIS — Z7401 Bed confinement status: Secondary | ICD-10-CM | POA: Diagnosis not present

## 2020-04-21 DIAGNOSIS — Z87440 Personal history of urinary (tract) infections: Secondary | ICD-10-CM | POA: Diagnosis not present

## 2020-04-21 DIAGNOSIS — Z86718 Personal history of other venous thrombosis and embolism: Secondary | ICD-10-CM | POA: Diagnosis not present

## 2020-04-21 DIAGNOSIS — R000 Tachycardia, unspecified: Secondary | ICD-10-CM | POA: Diagnosis not present

## 2020-04-21 DIAGNOSIS — I69320 Aphasia following cerebral infarction: Secondary | ICD-10-CM | POA: Diagnosis not present

## 2020-04-21 DIAGNOSIS — R339 Retention of urine, unspecified: Secondary | ICD-10-CM | POA: Diagnosis not present

## 2020-04-21 DIAGNOSIS — G40909 Epilepsy, unspecified, not intractable, without status epilepticus: Secondary | ICD-10-CM | POA: Diagnosis not present

## 2020-04-21 DIAGNOSIS — I69365 Other paralytic syndrome following cerebral infarction, bilateral: Secondary | ICD-10-CM | POA: Diagnosis not present

## 2020-04-21 DIAGNOSIS — G8250 Quadriplegia, unspecified: Secondary | ICD-10-CM | POA: Diagnosis not present

## 2020-04-21 DIAGNOSIS — I10 Essential (primary) hypertension: Secondary | ICD-10-CM | POA: Diagnosis not present

## 2020-04-22 DIAGNOSIS — R000 Tachycardia, unspecified: Secondary | ICD-10-CM | POA: Diagnosis not present

## 2020-04-22 DIAGNOSIS — G4733 Obstructive sleep apnea (adult) (pediatric): Secondary | ICD-10-CM | POA: Diagnosis not present

## 2020-04-22 DIAGNOSIS — I69365 Other paralytic syndrome following cerebral infarction, bilateral: Secondary | ICD-10-CM | POA: Diagnosis not present

## 2020-04-22 DIAGNOSIS — I6931 Attention and concentration deficit following cerebral infarction: Secondary | ICD-10-CM | POA: Diagnosis not present

## 2020-04-22 DIAGNOSIS — G40909 Epilepsy, unspecified, not intractable, without status epilepticus: Secondary | ICD-10-CM | POA: Diagnosis not present

## 2020-04-22 DIAGNOSIS — Z87440 Personal history of urinary (tract) infections: Secondary | ICD-10-CM | POA: Diagnosis not present

## 2020-04-22 DIAGNOSIS — I10 Essential (primary) hypertension: Secondary | ICD-10-CM | POA: Diagnosis not present

## 2020-04-22 DIAGNOSIS — D649 Anemia, unspecified: Secondary | ICD-10-CM | POA: Diagnosis not present

## 2020-04-22 DIAGNOSIS — R339 Retention of urine, unspecified: Secondary | ICD-10-CM | POA: Diagnosis not present

## 2020-04-22 DIAGNOSIS — I69311 Memory deficit following cerebral infarction: Secondary | ICD-10-CM | POA: Diagnosis not present

## 2020-04-22 DIAGNOSIS — Z7401 Bed confinement status: Secondary | ICD-10-CM | POA: Diagnosis not present

## 2020-04-22 DIAGNOSIS — Z86718 Personal history of other venous thrombosis and embolism: Secondary | ICD-10-CM | POA: Diagnosis not present

## 2020-04-22 DIAGNOSIS — E1165 Type 2 diabetes mellitus with hyperglycemia: Secondary | ICD-10-CM | POA: Diagnosis not present

## 2020-04-22 DIAGNOSIS — I69320 Aphasia following cerebral infarction: Secondary | ICD-10-CM | POA: Diagnosis not present

## 2020-04-22 DIAGNOSIS — G8250 Quadriplegia, unspecified: Secondary | ICD-10-CM | POA: Diagnosis not present

## 2020-04-23 DIAGNOSIS — I6931 Attention and concentration deficit following cerebral infarction: Secondary | ICD-10-CM | POA: Diagnosis not present

## 2020-04-23 DIAGNOSIS — I69311 Memory deficit following cerebral infarction: Secondary | ICD-10-CM | POA: Diagnosis not present

## 2020-04-23 DIAGNOSIS — G4733 Obstructive sleep apnea (adult) (pediatric): Secondary | ICD-10-CM | POA: Diagnosis not present

## 2020-04-23 DIAGNOSIS — I10 Essential (primary) hypertension: Secondary | ICD-10-CM | POA: Diagnosis not present

## 2020-04-23 DIAGNOSIS — D649 Anemia, unspecified: Secondary | ICD-10-CM | POA: Diagnosis not present

## 2020-04-23 DIAGNOSIS — Z7401 Bed confinement status: Secondary | ICD-10-CM | POA: Diagnosis not present

## 2020-04-23 DIAGNOSIS — I69320 Aphasia following cerebral infarction: Secondary | ICD-10-CM | POA: Diagnosis not present

## 2020-04-23 DIAGNOSIS — G40909 Epilepsy, unspecified, not intractable, without status epilepticus: Secondary | ICD-10-CM | POA: Diagnosis not present

## 2020-04-23 DIAGNOSIS — E1165 Type 2 diabetes mellitus with hyperglycemia: Secondary | ICD-10-CM | POA: Diagnosis not present

## 2020-04-23 DIAGNOSIS — Z86718 Personal history of other venous thrombosis and embolism: Secondary | ICD-10-CM | POA: Diagnosis not present

## 2020-04-23 DIAGNOSIS — G8250 Quadriplegia, unspecified: Secondary | ICD-10-CM | POA: Diagnosis not present

## 2020-04-23 DIAGNOSIS — I69365 Other paralytic syndrome following cerebral infarction, bilateral: Secondary | ICD-10-CM | POA: Diagnosis not present

## 2020-04-23 DIAGNOSIS — Z87440 Personal history of urinary (tract) infections: Secondary | ICD-10-CM | POA: Diagnosis not present

## 2020-04-23 DIAGNOSIS — R000 Tachycardia, unspecified: Secondary | ICD-10-CM | POA: Diagnosis not present

## 2020-04-23 DIAGNOSIS — R339 Retention of urine, unspecified: Secondary | ICD-10-CM | POA: Diagnosis not present

## 2020-04-27 DIAGNOSIS — J019 Acute sinusitis, unspecified: Secondary | ICD-10-CM | POA: Diagnosis not present

## 2020-04-27 DIAGNOSIS — Z7401 Bed confinement status: Secondary | ICD-10-CM | POA: Diagnosis not present

## 2020-04-27 DIAGNOSIS — I69365 Other paralytic syndrome following cerebral infarction, bilateral: Secondary | ICD-10-CM | POA: Diagnosis not present

## 2020-04-27 DIAGNOSIS — E1165 Type 2 diabetes mellitus with hyperglycemia: Secondary | ICD-10-CM | POA: Diagnosis not present

## 2020-04-27 DIAGNOSIS — G40909 Epilepsy, unspecified, not intractable, without status epilepticus: Secondary | ICD-10-CM | POA: Diagnosis not present

## 2020-04-27 DIAGNOSIS — R339 Retention of urine, unspecified: Secondary | ICD-10-CM | POA: Diagnosis not present

## 2020-04-27 DIAGNOSIS — D649 Anemia, unspecified: Secondary | ICD-10-CM | POA: Diagnosis not present

## 2020-04-27 DIAGNOSIS — G4733 Obstructive sleep apnea (adult) (pediatric): Secondary | ICD-10-CM | POA: Diagnosis not present

## 2020-04-27 DIAGNOSIS — Z86718 Personal history of other venous thrombosis and embolism: Secondary | ICD-10-CM | POA: Diagnosis not present

## 2020-04-27 DIAGNOSIS — I10 Essential (primary) hypertension: Secondary | ICD-10-CM | POA: Diagnosis not present

## 2020-04-27 DIAGNOSIS — R000 Tachycardia, unspecified: Secondary | ICD-10-CM | POA: Diagnosis not present

## 2020-04-27 DIAGNOSIS — I69311 Memory deficit following cerebral infarction: Secondary | ICD-10-CM | POA: Diagnosis not present

## 2020-04-27 DIAGNOSIS — I6931 Attention and concentration deficit following cerebral infarction: Secondary | ICD-10-CM | POA: Diagnosis not present

## 2020-04-27 DIAGNOSIS — Z87440 Personal history of urinary (tract) infections: Secondary | ICD-10-CM | POA: Diagnosis not present

## 2020-04-27 DIAGNOSIS — I69320 Aphasia following cerebral infarction: Secondary | ICD-10-CM | POA: Diagnosis not present

## 2020-04-27 DIAGNOSIS — G8250 Quadriplegia, unspecified: Secondary | ICD-10-CM | POA: Diagnosis not present

## 2020-04-28 DIAGNOSIS — R339 Retention of urine, unspecified: Secondary | ICD-10-CM | POA: Diagnosis not present

## 2020-04-28 DIAGNOSIS — D649 Anemia, unspecified: Secondary | ICD-10-CM | POA: Diagnosis not present

## 2020-04-28 DIAGNOSIS — I69320 Aphasia following cerebral infarction: Secondary | ICD-10-CM | POA: Diagnosis not present

## 2020-04-28 DIAGNOSIS — R000 Tachycardia, unspecified: Secondary | ICD-10-CM | POA: Diagnosis not present

## 2020-04-28 DIAGNOSIS — E1165 Type 2 diabetes mellitus with hyperglycemia: Secondary | ICD-10-CM | POA: Diagnosis not present

## 2020-04-28 DIAGNOSIS — I10 Essential (primary) hypertension: Secondary | ICD-10-CM | POA: Diagnosis not present

## 2020-04-28 DIAGNOSIS — G4733 Obstructive sleep apnea (adult) (pediatric): Secondary | ICD-10-CM | POA: Diagnosis not present

## 2020-04-28 DIAGNOSIS — Z87440 Personal history of urinary (tract) infections: Secondary | ICD-10-CM | POA: Diagnosis not present

## 2020-04-28 DIAGNOSIS — I69365 Other paralytic syndrome following cerebral infarction, bilateral: Secondary | ICD-10-CM | POA: Diagnosis not present

## 2020-04-28 DIAGNOSIS — Z7401 Bed confinement status: Secondary | ICD-10-CM | POA: Diagnosis not present

## 2020-04-28 DIAGNOSIS — G40909 Epilepsy, unspecified, not intractable, without status epilepticus: Secondary | ICD-10-CM | POA: Diagnosis not present

## 2020-04-28 DIAGNOSIS — I6931 Attention and concentration deficit following cerebral infarction: Secondary | ICD-10-CM | POA: Diagnosis not present

## 2020-04-28 DIAGNOSIS — I69311 Memory deficit following cerebral infarction: Secondary | ICD-10-CM | POA: Diagnosis not present

## 2020-04-28 DIAGNOSIS — Z86718 Personal history of other venous thrombosis and embolism: Secondary | ICD-10-CM | POA: Diagnosis not present

## 2020-04-28 DIAGNOSIS — G8250 Quadriplegia, unspecified: Secondary | ICD-10-CM | POA: Diagnosis not present

## 2020-04-29 DIAGNOSIS — I69320 Aphasia following cerebral infarction: Secondary | ICD-10-CM | POA: Diagnosis not present

## 2020-04-29 DIAGNOSIS — I6931 Attention and concentration deficit following cerebral infarction: Secondary | ICD-10-CM | POA: Diagnosis not present

## 2020-04-29 DIAGNOSIS — Z86718 Personal history of other venous thrombosis and embolism: Secondary | ICD-10-CM | POA: Diagnosis not present

## 2020-04-29 DIAGNOSIS — I69311 Memory deficit following cerebral infarction: Secondary | ICD-10-CM | POA: Diagnosis not present

## 2020-04-29 DIAGNOSIS — G40909 Epilepsy, unspecified, not intractable, without status epilepticus: Secondary | ICD-10-CM | POA: Diagnosis not present

## 2020-04-29 DIAGNOSIS — I69365 Other paralytic syndrome following cerebral infarction, bilateral: Secondary | ICD-10-CM | POA: Diagnosis not present

## 2020-04-29 DIAGNOSIS — Z87440 Personal history of urinary (tract) infections: Secondary | ICD-10-CM | POA: Diagnosis not present

## 2020-04-29 DIAGNOSIS — E1165 Type 2 diabetes mellitus with hyperglycemia: Secondary | ICD-10-CM | POA: Diagnosis not present

## 2020-04-29 DIAGNOSIS — Z7401 Bed confinement status: Secondary | ICD-10-CM | POA: Diagnosis not present

## 2020-04-29 DIAGNOSIS — G4733 Obstructive sleep apnea (adult) (pediatric): Secondary | ICD-10-CM | POA: Diagnosis not present

## 2020-04-29 DIAGNOSIS — D649 Anemia, unspecified: Secondary | ICD-10-CM | POA: Diagnosis not present

## 2020-04-29 DIAGNOSIS — I10 Essential (primary) hypertension: Secondary | ICD-10-CM | POA: Diagnosis not present

## 2020-04-29 DIAGNOSIS — R000 Tachycardia, unspecified: Secondary | ICD-10-CM | POA: Diagnosis not present

## 2020-04-29 DIAGNOSIS — G8250 Quadriplegia, unspecified: Secondary | ICD-10-CM | POA: Diagnosis not present

## 2020-04-29 DIAGNOSIS — R339 Retention of urine, unspecified: Secondary | ICD-10-CM | POA: Diagnosis not present

## 2020-04-30 DIAGNOSIS — R000 Tachycardia, unspecified: Secondary | ICD-10-CM | POA: Diagnosis not present

## 2020-04-30 DIAGNOSIS — Z87440 Personal history of urinary (tract) infections: Secondary | ICD-10-CM | POA: Diagnosis not present

## 2020-04-30 DIAGNOSIS — I10 Essential (primary) hypertension: Secondary | ICD-10-CM | POA: Diagnosis not present

## 2020-04-30 DIAGNOSIS — Z86718 Personal history of other venous thrombosis and embolism: Secondary | ICD-10-CM | POA: Diagnosis not present

## 2020-04-30 DIAGNOSIS — G8250 Quadriplegia, unspecified: Secondary | ICD-10-CM | POA: Diagnosis not present

## 2020-04-30 DIAGNOSIS — D649 Anemia, unspecified: Secondary | ICD-10-CM | POA: Diagnosis not present

## 2020-04-30 DIAGNOSIS — I69365 Other paralytic syndrome following cerebral infarction, bilateral: Secondary | ICD-10-CM | POA: Diagnosis not present

## 2020-04-30 DIAGNOSIS — I69311 Memory deficit following cerebral infarction: Secondary | ICD-10-CM | POA: Diagnosis not present

## 2020-04-30 DIAGNOSIS — G40909 Epilepsy, unspecified, not intractable, without status epilepticus: Secondary | ICD-10-CM | POA: Diagnosis not present

## 2020-04-30 DIAGNOSIS — E1165 Type 2 diabetes mellitus with hyperglycemia: Secondary | ICD-10-CM | POA: Diagnosis not present

## 2020-04-30 DIAGNOSIS — Z7401 Bed confinement status: Secondary | ICD-10-CM | POA: Diagnosis not present

## 2020-04-30 DIAGNOSIS — I69320 Aphasia following cerebral infarction: Secondary | ICD-10-CM | POA: Diagnosis not present

## 2020-04-30 DIAGNOSIS — G4733 Obstructive sleep apnea (adult) (pediatric): Secondary | ICD-10-CM | POA: Diagnosis not present

## 2020-04-30 DIAGNOSIS — R339 Retention of urine, unspecified: Secondary | ICD-10-CM | POA: Diagnosis not present

## 2020-04-30 DIAGNOSIS — I6931 Attention and concentration deficit following cerebral infarction: Secondary | ICD-10-CM | POA: Diagnosis not present

## 2020-05-01 DIAGNOSIS — Z86718 Personal history of other venous thrombosis and embolism: Secondary | ICD-10-CM | POA: Diagnosis not present

## 2020-05-01 DIAGNOSIS — E1165 Type 2 diabetes mellitus with hyperglycemia: Secondary | ICD-10-CM | POA: Diagnosis not present

## 2020-05-01 DIAGNOSIS — R339 Retention of urine, unspecified: Secondary | ICD-10-CM | POA: Diagnosis not present

## 2020-05-01 DIAGNOSIS — I10 Essential (primary) hypertension: Secondary | ICD-10-CM | POA: Diagnosis not present

## 2020-05-01 DIAGNOSIS — G8250 Quadriplegia, unspecified: Secondary | ICD-10-CM | POA: Diagnosis not present

## 2020-05-01 DIAGNOSIS — D649 Anemia, unspecified: Secondary | ICD-10-CM | POA: Diagnosis not present

## 2020-05-01 DIAGNOSIS — G4733 Obstructive sleep apnea (adult) (pediatric): Secondary | ICD-10-CM | POA: Diagnosis not present

## 2020-05-01 DIAGNOSIS — R000 Tachycardia, unspecified: Secondary | ICD-10-CM | POA: Diagnosis not present

## 2020-05-01 DIAGNOSIS — G40909 Epilepsy, unspecified, not intractable, without status epilepticus: Secondary | ICD-10-CM | POA: Diagnosis not present

## 2020-05-01 DIAGNOSIS — Z87440 Personal history of urinary (tract) infections: Secondary | ICD-10-CM | POA: Diagnosis not present

## 2020-05-01 DIAGNOSIS — Z7401 Bed confinement status: Secondary | ICD-10-CM | POA: Diagnosis not present

## 2020-05-01 DIAGNOSIS — I6931 Attention and concentration deficit following cerebral infarction: Secondary | ICD-10-CM | POA: Diagnosis not present

## 2020-05-01 DIAGNOSIS — I69311 Memory deficit following cerebral infarction: Secondary | ICD-10-CM | POA: Diagnosis not present

## 2020-05-01 DIAGNOSIS — I69320 Aphasia following cerebral infarction: Secondary | ICD-10-CM | POA: Diagnosis not present

## 2020-05-01 DIAGNOSIS — I69365 Other paralytic syndrome following cerebral infarction, bilateral: Secondary | ICD-10-CM | POA: Diagnosis not present

## 2020-05-03 DIAGNOSIS — R2689 Other abnormalities of gait and mobility: Secondary | ICD-10-CM | POA: Diagnosis not present

## 2020-05-03 DIAGNOSIS — R262 Difficulty in walking, not elsewhere classified: Secondary | ICD-10-CM | POA: Diagnosis not present

## 2020-05-03 DIAGNOSIS — I6389 Other cerebral infarction: Secondary | ICD-10-CM | POA: Diagnosis not present

## 2020-05-04 DIAGNOSIS — R339 Retention of urine, unspecified: Secondary | ICD-10-CM | POA: Diagnosis not present

## 2020-05-04 DIAGNOSIS — R000 Tachycardia, unspecified: Secondary | ICD-10-CM | POA: Diagnosis not present

## 2020-05-04 DIAGNOSIS — G4733 Obstructive sleep apnea (adult) (pediatric): Secondary | ICD-10-CM | POA: Diagnosis not present

## 2020-05-04 DIAGNOSIS — I69320 Aphasia following cerebral infarction: Secondary | ICD-10-CM | POA: Diagnosis not present

## 2020-05-04 DIAGNOSIS — Z7401 Bed confinement status: Secondary | ICD-10-CM | POA: Diagnosis not present

## 2020-05-04 DIAGNOSIS — I69311 Memory deficit following cerebral infarction: Secondary | ICD-10-CM | POA: Diagnosis not present

## 2020-05-04 DIAGNOSIS — Z87440 Personal history of urinary (tract) infections: Secondary | ICD-10-CM | POA: Diagnosis not present

## 2020-05-04 DIAGNOSIS — I6931 Attention and concentration deficit following cerebral infarction: Secondary | ICD-10-CM | POA: Diagnosis not present

## 2020-05-04 DIAGNOSIS — E1165 Type 2 diabetes mellitus with hyperglycemia: Secondary | ICD-10-CM | POA: Diagnosis not present

## 2020-05-04 DIAGNOSIS — Z86718 Personal history of other venous thrombosis and embolism: Secondary | ICD-10-CM | POA: Diagnosis not present

## 2020-05-04 DIAGNOSIS — G40909 Epilepsy, unspecified, not intractable, without status epilepticus: Secondary | ICD-10-CM | POA: Diagnosis not present

## 2020-05-04 DIAGNOSIS — I10 Essential (primary) hypertension: Secondary | ICD-10-CM | POA: Diagnosis not present

## 2020-05-04 DIAGNOSIS — I69365 Other paralytic syndrome following cerebral infarction, bilateral: Secondary | ICD-10-CM | POA: Diagnosis not present

## 2020-05-04 DIAGNOSIS — D649 Anemia, unspecified: Secondary | ICD-10-CM | POA: Diagnosis not present

## 2020-05-04 DIAGNOSIS — G8250 Quadriplegia, unspecified: Secondary | ICD-10-CM | POA: Diagnosis not present

## 2020-05-06 DIAGNOSIS — I69365 Other paralytic syndrome following cerebral infarction, bilateral: Secondary | ICD-10-CM | POA: Diagnosis not present

## 2020-05-06 DIAGNOSIS — G40909 Epilepsy, unspecified, not intractable, without status epilepticus: Secondary | ICD-10-CM | POA: Diagnosis not present

## 2020-05-06 DIAGNOSIS — I10 Essential (primary) hypertension: Secondary | ICD-10-CM | POA: Diagnosis not present

## 2020-05-06 DIAGNOSIS — G8250 Quadriplegia, unspecified: Secondary | ICD-10-CM | POA: Diagnosis not present

## 2020-05-06 DIAGNOSIS — R339 Retention of urine, unspecified: Secondary | ICD-10-CM | POA: Diagnosis not present

## 2020-05-06 DIAGNOSIS — Z86718 Personal history of other venous thrombosis and embolism: Secondary | ICD-10-CM | POA: Diagnosis not present

## 2020-05-06 DIAGNOSIS — I69320 Aphasia following cerebral infarction: Secondary | ICD-10-CM | POA: Diagnosis not present

## 2020-05-06 DIAGNOSIS — D649 Anemia, unspecified: Secondary | ICD-10-CM | POA: Diagnosis not present

## 2020-05-06 DIAGNOSIS — G4733 Obstructive sleep apnea (adult) (pediatric): Secondary | ICD-10-CM | POA: Diagnosis not present

## 2020-05-06 DIAGNOSIS — R000 Tachycardia, unspecified: Secondary | ICD-10-CM | POA: Diagnosis not present

## 2020-05-06 DIAGNOSIS — Z87440 Personal history of urinary (tract) infections: Secondary | ICD-10-CM | POA: Diagnosis not present

## 2020-05-06 DIAGNOSIS — E1165 Type 2 diabetes mellitus with hyperglycemia: Secondary | ICD-10-CM | POA: Diagnosis not present

## 2020-05-06 DIAGNOSIS — I69311 Memory deficit following cerebral infarction: Secondary | ICD-10-CM | POA: Diagnosis not present

## 2020-05-06 DIAGNOSIS — I6931 Attention and concentration deficit following cerebral infarction: Secondary | ICD-10-CM | POA: Diagnosis not present

## 2020-05-06 DIAGNOSIS — Z7401 Bed confinement status: Secondary | ICD-10-CM | POA: Diagnosis not present

## 2020-05-08 DIAGNOSIS — G8250 Quadriplegia, unspecified: Secondary | ICD-10-CM | POA: Diagnosis not present

## 2020-05-08 DIAGNOSIS — Z7401 Bed confinement status: Secondary | ICD-10-CM | POA: Diagnosis not present

## 2020-05-08 DIAGNOSIS — G4733 Obstructive sleep apnea (adult) (pediatric): Secondary | ICD-10-CM | POA: Diagnosis not present

## 2020-05-08 DIAGNOSIS — Z86718 Personal history of other venous thrombosis and embolism: Secondary | ICD-10-CM | POA: Diagnosis not present

## 2020-05-08 DIAGNOSIS — I69320 Aphasia following cerebral infarction: Secondary | ICD-10-CM | POA: Diagnosis not present

## 2020-05-08 DIAGNOSIS — L8994 Pressure ulcer of unspecified site, stage 4: Secondary | ICD-10-CM | POA: Diagnosis not present

## 2020-05-08 DIAGNOSIS — I6389 Other cerebral infarction: Secondary | ICD-10-CM | POA: Diagnosis not present

## 2020-05-08 DIAGNOSIS — R339 Retention of urine, unspecified: Secondary | ICD-10-CM | POA: Diagnosis not present

## 2020-05-08 DIAGNOSIS — Z87440 Personal history of urinary (tract) infections: Secondary | ICD-10-CM | POA: Diagnosis not present

## 2020-05-08 DIAGNOSIS — G40909 Epilepsy, unspecified, not intractable, without status epilepticus: Secondary | ICD-10-CM | POA: Diagnosis not present

## 2020-05-08 DIAGNOSIS — I69365 Other paralytic syndrome following cerebral infarction, bilateral: Secondary | ICD-10-CM | POA: Diagnosis not present

## 2020-05-08 DIAGNOSIS — R000 Tachycardia, unspecified: Secondary | ICD-10-CM | POA: Diagnosis not present

## 2020-05-08 DIAGNOSIS — E1165 Type 2 diabetes mellitus with hyperglycemia: Secondary | ICD-10-CM | POA: Diagnosis not present

## 2020-05-08 DIAGNOSIS — I10 Essential (primary) hypertension: Secondary | ICD-10-CM | POA: Diagnosis not present

## 2020-05-08 DIAGNOSIS — I69311 Memory deficit following cerebral infarction: Secondary | ICD-10-CM | POA: Diagnosis not present

## 2020-05-08 DIAGNOSIS — D649 Anemia, unspecified: Secondary | ICD-10-CM | POA: Diagnosis not present

## 2020-05-08 DIAGNOSIS — I6931 Attention and concentration deficit following cerebral infarction: Secondary | ICD-10-CM | POA: Diagnosis not present

## 2020-05-09 DIAGNOSIS — L89154 Pressure ulcer of sacral region, stage 4: Secondary | ICD-10-CM | POA: Diagnosis not present

## 2020-05-09 DIAGNOSIS — T8189XA Other complications of procedures, not elsewhere classified, initial encounter: Secondary | ICD-10-CM | POA: Diagnosis not present

## 2020-05-12 DIAGNOSIS — I10 Essential (primary) hypertension: Secondary | ICD-10-CM | POA: Diagnosis not present

## 2020-05-12 DIAGNOSIS — I6931 Attention and concentration deficit following cerebral infarction: Secondary | ICD-10-CM | POA: Diagnosis not present

## 2020-05-12 DIAGNOSIS — Z7401 Bed confinement status: Secondary | ICD-10-CM | POA: Diagnosis not present

## 2020-05-12 DIAGNOSIS — G4733 Obstructive sleep apnea (adult) (pediatric): Secondary | ICD-10-CM | POA: Diagnosis not present

## 2020-05-12 DIAGNOSIS — G8250 Quadriplegia, unspecified: Secondary | ICD-10-CM | POA: Diagnosis not present

## 2020-05-12 DIAGNOSIS — Z87440 Personal history of urinary (tract) infections: Secondary | ICD-10-CM | POA: Diagnosis not present

## 2020-05-12 DIAGNOSIS — I69311 Memory deficit following cerebral infarction: Secondary | ICD-10-CM | POA: Diagnosis not present

## 2020-05-12 DIAGNOSIS — I69365 Other paralytic syndrome following cerebral infarction, bilateral: Secondary | ICD-10-CM | POA: Diagnosis not present

## 2020-05-12 DIAGNOSIS — E1165 Type 2 diabetes mellitus with hyperglycemia: Secondary | ICD-10-CM | POA: Diagnosis not present

## 2020-05-12 DIAGNOSIS — I69320 Aphasia following cerebral infarction: Secondary | ICD-10-CM | POA: Diagnosis not present

## 2020-05-12 DIAGNOSIS — R339 Retention of urine, unspecified: Secondary | ICD-10-CM | POA: Diagnosis not present

## 2020-05-12 DIAGNOSIS — Z86718 Personal history of other venous thrombosis and embolism: Secondary | ICD-10-CM | POA: Diagnosis not present

## 2020-05-12 DIAGNOSIS — G40909 Epilepsy, unspecified, not intractable, without status epilepticus: Secondary | ICD-10-CM | POA: Diagnosis not present

## 2020-05-12 DIAGNOSIS — R000 Tachycardia, unspecified: Secondary | ICD-10-CM | POA: Diagnosis not present

## 2020-05-12 DIAGNOSIS — D649 Anemia, unspecified: Secondary | ICD-10-CM | POA: Diagnosis not present

## 2020-05-14 DIAGNOSIS — Z87440 Personal history of urinary (tract) infections: Secondary | ICD-10-CM | POA: Diagnosis not present

## 2020-05-14 DIAGNOSIS — R000 Tachycardia, unspecified: Secondary | ICD-10-CM | POA: Diagnosis not present

## 2020-05-14 DIAGNOSIS — I69311 Memory deficit following cerebral infarction: Secondary | ICD-10-CM | POA: Diagnosis not present

## 2020-05-14 DIAGNOSIS — G8250 Quadriplegia, unspecified: Secondary | ICD-10-CM | POA: Diagnosis not present

## 2020-05-14 DIAGNOSIS — I10 Essential (primary) hypertension: Secondary | ICD-10-CM | POA: Diagnosis not present

## 2020-05-14 DIAGNOSIS — E1165 Type 2 diabetes mellitus with hyperglycemia: Secondary | ICD-10-CM | POA: Diagnosis not present

## 2020-05-14 DIAGNOSIS — Z7401 Bed confinement status: Secondary | ICD-10-CM | POA: Diagnosis not present

## 2020-05-14 DIAGNOSIS — I6931 Attention and concentration deficit following cerebral infarction: Secondary | ICD-10-CM | POA: Diagnosis not present

## 2020-05-14 DIAGNOSIS — I69365 Other paralytic syndrome following cerebral infarction, bilateral: Secondary | ICD-10-CM | POA: Diagnosis not present

## 2020-05-14 DIAGNOSIS — D649 Anemia, unspecified: Secondary | ICD-10-CM | POA: Diagnosis not present

## 2020-05-14 DIAGNOSIS — G40909 Epilepsy, unspecified, not intractable, without status epilepticus: Secondary | ICD-10-CM | POA: Diagnosis not present

## 2020-05-14 DIAGNOSIS — Z86718 Personal history of other venous thrombosis and embolism: Secondary | ICD-10-CM | POA: Diagnosis not present

## 2020-05-14 DIAGNOSIS — R339 Retention of urine, unspecified: Secondary | ICD-10-CM | POA: Diagnosis not present

## 2020-05-14 DIAGNOSIS — G4733 Obstructive sleep apnea (adult) (pediatric): Secondary | ICD-10-CM | POA: Diagnosis not present

## 2020-05-14 DIAGNOSIS — I69320 Aphasia following cerebral infarction: Secondary | ICD-10-CM | POA: Diagnosis not present

## 2020-05-15 DIAGNOSIS — D649 Anemia, unspecified: Secondary | ICD-10-CM | POA: Diagnosis not present

## 2020-05-15 DIAGNOSIS — R339 Retention of urine, unspecified: Secondary | ICD-10-CM | POA: Diagnosis not present

## 2020-05-15 DIAGNOSIS — Z7401 Bed confinement status: Secondary | ICD-10-CM | POA: Diagnosis not present

## 2020-05-15 DIAGNOSIS — Z87440 Personal history of urinary (tract) infections: Secondary | ICD-10-CM | POA: Diagnosis not present

## 2020-05-15 DIAGNOSIS — E1165 Type 2 diabetes mellitus with hyperglycemia: Secondary | ICD-10-CM | POA: Diagnosis not present

## 2020-05-15 DIAGNOSIS — I69320 Aphasia following cerebral infarction: Secondary | ICD-10-CM | POA: Diagnosis not present

## 2020-05-15 DIAGNOSIS — I10 Essential (primary) hypertension: Secondary | ICD-10-CM | POA: Diagnosis not present

## 2020-05-15 DIAGNOSIS — Z86718 Personal history of other venous thrombosis and embolism: Secondary | ICD-10-CM | POA: Diagnosis not present

## 2020-05-15 DIAGNOSIS — I69311 Memory deficit following cerebral infarction: Secondary | ICD-10-CM | POA: Diagnosis not present

## 2020-05-15 DIAGNOSIS — G40909 Epilepsy, unspecified, not intractable, without status epilepticus: Secondary | ICD-10-CM | POA: Diagnosis not present

## 2020-05-15 DIAGNOSIS — I69365 Other paralytic syndrome following cerebral infarction, bilateral: Secondary | ICD-10-CM | POA: Diagnosis not present

## 2020-05-15 DIAGNOSIS — I6931 Attention and concentration deficit following cerebral infarction: Secondary | ICD-10-CM | POA: Diagnosis not present

## 2020-05-15 DIAGNOSIS — R000 Tachycardia, unspecified: Secondary | ICD-10-CM | POA: Diagnosis not present

## 2020-05-15 DIAGNOSIS — G8250 Quadriplegia, unspecified: Secondary | ICD-10-CM | POA: Diagnosis not present

## 2020-05-15 DIAGNOSIS — G4733 Obstructive sleep apnea (adult) (pediatric): Secondary | ICD-10-CM | POA: Diagnosis not present

## 2020-05-16 DIAGNOSIS — Z7401 Bed confinement status: Secondary | ICD-10-CM | POA: Diagnosis not present

## 2020-05-16 DIAGNOSIS — I69311 Memory deficit following cerebral infarction: Secondary | ICD-10-CM | POA: Diagnosis not present

## 2020-05-16 DIAGNOSIS — I10 Essential (primary) hypertension: Secondary | ICD-10-CM | POA: Diagnosis not present

## 2020-05-16 DIAGNOSIS — G4733 Obstructive sleep apnea (adult) (pediatric): Secondary | ICD-10-CM | POA: Diagnosis not present

## 2020-05-16 DIAGNOSIS — G40909 Epilepsy, unspecified, not intractable, without status epilepticus: Secondary | ICD-10-CM | POA: Diagnosis not present

## 2020-05-16 DIAGNOSIS — G8250 Quadriplegia, unspecified: Secondary | ICD-10-CM | POA: Diagnosis not present

## 2020-05-16 DIAGNOSIS — I69365 Other paralytic syndrome following cerebral infarction, bilateral: Secondary | ICD-10-CM | POA: Diagnosis not present

## 2020-05-16 DIAGNOSIS — I6931 Attention and concentration deficit following cerebral infarction: Secondary | ICD-10-CM | POA: Diagnosis not present

## 2020-05-16 DIAGNOSIS — R339 Retention of urine, unspecified: Secondary | ICD-10-CM | POA: Diagnosis not present

## 2020-05-16 DIAGNOSIS — I69320 Aphasia following cerebral infarction: Secondary | ICD-10-CM | POA: Diagnosis not present

## 2020-05-16 DIAGNOSIS — Z86718 Personal history of other venous thrombosis and embolism: Secondary | ICD-10-CM | POA: Diagnosis not present

## 2020-05-16 DIAGNOSIS — Z87440 Personal history of urinary (tract) infections: Secondary | ICD-10-CM | POA: Diagnosis not present

## 2020-05-16 DIAGNOSIS — D649 Anemia, unspecified: Secondary | ICD-10-CM | POA: Diagnosis not present

## 2020-05-16 DIAGNOSIS — E1165 Type 2 diabetes mellitus with hyperglycemia: Secondary | ICD-10-CM | POA: Diagnosis not present

## 2020-05-16 DIAGNOSIS — R000 Tachycardia, unspecified: Secondary | ICD-10-CM | POA: Diagnosis not present

## 2020-05-18 DIAGNOSIS — D649 Anemia, unspecified: Secondary | ICD-10-CM | POA: Diagnosis not present

## 2020-05-18 DIAGNOSIS — Z87440 Personal history of urinary (tract) infections: Secondary | ICD-10-CM | POA: Diagnosis not present

## 2020-05-18 DIAGNOSIS — E1165 Type 2 diabetes mellitus with hyperglycemia: Secondary | ICD-10-CM | POA: Diagnosis not present

## 2020-05-18 DIAGNOSIS — I69365 Other paralytic syndrome following cerebral infarction, bilateral: Secondary | ICD-10-CM | POA: Diagnosis not present

## 2020-05-18 DIAGNOSIS — G8250 Quadriplegia, unspecified: Secondary | ICD-10-CM | POA: Diagnosis not present

## 2020-05-18 DIAGNOSIS — I69320 Aphasia following cerebral infarction: Secondary | ICD-10-CM | POA: Diagnosis not present

## 2020-05-18 DIAGNOSIS — I69311 Memory deficit following cerebral infarction: Secondary | ICD-10-CM | POA: Diagnosis not present

## 2020-05-18 DIAGNOSIS — G40909 Epilepsy, unspecified, not intractable, without status epilepticus: Secondary | ICD-10-CM | POA: Diagnosis not present

## 2020-05-18 DIAGNOSIS — I6931 Attention and concentration deficit following cerebral infarction: Secondary | ICD-10-CM | POA: Diagnosis not present

## 2020-05-18 DIAGNOSIS — I10 Essential (primary) hypertension: Secondary | ICD-10-CM | POA: Diagnosis not present

## 2020-05-18 DIAGNOSIS — R339 Retention of urine, unspecified: Secondary | ICD-10-CM | POA: Diagnosis not present

## 2020-05-18 DIAGNOSIS — Z86718 Personal history of other venous thrombosis and embolism: Secondary | ICD-10-CM | POA: Diagnosis not present

## 2020-05-18 DIAGNOSIS — R000 Tachycardia, unspecified: Secondary | ICD-10-CM | POA: Diagnosis not present

## 2020-05-18 DIAGNOSIS — Z7401 Bed confinement status: Secondary | ICD-10-CM | POA: Diagnosis not present

## 2020-05-18 DIAGNOSIS — G4733 Obstructive sleep apnea (adult) (pediatric): Secondary | ICD-10-CM | POA: Diagnosis not present

## 2020-05-19 DIAGNOSIS — H47013 Ischemic optic neuropathy, bilateral: Secondary | ICD-10-CM | POA: Diagnosis not present

## 2020-05-20 DIAGNOSIS — D649 Anemia, unspecified: Secondary | ICD-10-CM | POA: Diagnosis not present

## 2020-05-20 DIAGNOSIS — E1165 Type 2 diabetes mellitus with hyperglycemia: Secondary | ICD-10-CM | POA: Diagnosis not present

## 2020-05-20 DIAGNOSIS — Z86718 Personal history of other venous thrombosis and embolism: Secondary | ICD-10-CM | POA: Diagnosis not present

## 2020-05-20 DIAGNOSIS — R339 Retention of urine, unspecified: Secondary | ICD-10-CM | POA: Diagnosis not present

## 2020-05-20 DIAGNOSIS — I69320 Aphasia following cerebral infarction: Secondary | ICD-10-CM | POA: Diagnosis not present

## 2020-05-20 DIAGNOSIS — R000 Tachycardia, unspecified: Secondary | ICD-10-CM | POA: Diagnosis not present

## 2020-05-20 DIAGNOSIS — Z7401 Bed confinement status: Secondary | ICD-10-CM | POA: Diagnosis not present

## 2020-05-20 DIAGNOSIS — G4733 Obstructive sleep apnea (adult) (pediatric): Secondary | ICD-10-CM | POA: Diagnosis not present

## 2020-05-20 DIAGNOSIS — G40909 Epilepsy, unspecified, not intractable, without status epilepticus: Secondary | ICD-10-CM | POA: Diagnosis not present

## 2020-05-20 DIAGNOSIS — I6931 Attention and concentration deficit following cerebral infarction: Secondary | ICD-10-CM | POA: Diagnosis not present

## 2020-05-20 DIAGNOSIS — I69311 Memory deficit following cerebral infarction: Secondary | ICD-10-CM | POA: Diagnosis not present

## 2020-05-20 DIAGNOSIS — Z87440 Personal history of urinary (tract) infections: Secondary | ICD-10-CM | POA: Diagnosis not present

## 2020-05-20 DIAGNOSIS — I69365 Other paralytic syndrome following cerebral infarction, bilateral: Secondary | ICD-10-CM | POA: Diagnosis not present

## 2020-05-20 DIAGNOSIS — G8250 Quadriplegia, unspecified: Secondary | ICD-10-CM | POA: Diagnosis not present

## 2020-05-20 DIAGNOSIS — I10 Essential (primary) hypertension: Secondary | ICD-10-CM | POA: Diagnosis not present

## 2020-05-21 DIAGNOSIS — G4733 Obstructive sleep apnea (adult) (pediatric): Secondary | ICD-10-CM | POA: Diagnosis not present

## 2020-05-21 DIAGNOSIS — I6389 Other cerebral infarction: Secondary | ICD-10-CM | POA: Diagnosis not present

## 2020-05-21 DIAGNOSIS — L8994 Pressure ulcer of unspecified site, stage 4: Secondary | ICD-10-CM | POA: Diagnosis not present

## 2020-05-22 DIAGNOSIS — G40909 Epilepsy, unspecified, not intractable, without status epilepticus: Secondary | ICD-10-CM | POA: Diagnosis not present

## 2020-05-22 DIAGNOSIS — I69311 Memory deficit following cerebral infarction: Secondary | ICD-10-CM | POA: Diagnosis not present

## 2020-05-22 DIAGNOSIS — G8250 Quadriplegia, unspecified: Secondary | ICD-10-CM | POA: Diagnosis not present

## 2020-05-22 DIAGNOSIS — I10 Essential (primary) hypertension: Secondary | ICD-10-CM | POA: Diagnosis not present

## 2020-05-22 DIAGNOSIS — R000 Tachycardia, unspecified: Secondary | ICD-10-CM | POA: Diagnosis not present

## 2020-05-22 DIAGNOSIS — Z86718 Personal history of other venous thrombosis and embolism: Secondary | ICD-10-CM | POA: Diagnosis not present

## 2020-05-22 DIAGNOSIS — I6931 Attention and concentration deficit following cerebral infarction: Secondary | ICD-10-CM | POA: Diagnosis not present

## 2020-05-22 DIAGNOSIS — D649 Anemia, unspecified: Secondary | ICD-10-CM | POA: Diagnosis not present

## 2020-05-22 DIAGNOSIS — E1165 Type 2 diabetes mellitus with hyperglycemia: Secondary | ICD-10-CM | POA: Diagnosis not present

## 2020-05-22 DIAGNOSIS — Z7401 Bed confinement status: Secondary | ICD-10-CM | POA: Diagnosis not present

## 2020-05-22 DIAGNOSIS — G4733 Obstructive sleep apnea (adult) (pediatric): Secondary | ICD-10-CM | POA: Diagnosis not present

## 2020-05-22 DIAGNOSIS — I69320 Aphasia following cerebral infarction: Secondary | ICD-10-CM | POA: Diagnosis not present

## 2020-05-22 DIAGNOSIS — Z87440 Personal history of urinary (tract) infections: Secondary | ICD-10-CM | POA: Diagnosis not present

## 2020-05-22 DIAGNOSIS — R339 Retention of urine, unspecified: Secondary | ICD-10-CM | POA: Diagnosis not present

## 2020-05-22 DIAGNOSIS — I69365 Other paralytic syndrome following cerebral infarction, bilateral: Secondary | ICD-10-CM | POA: Diagnosis not present

## 2020-05-23 DIAGNOSIS — R339 Retention of urine, unspecified: Secondary | ICD-10-CM | POA: Diagnosis not present

## 2020-05-23 DIAGNOSIS — G4733 Obstructive sleep apnea (adult) (pediatric): Secondary | ICD-10-CM | POA: Diagnosis not present

## 2020-05-23 DIAGNOSIS — I69365 Other paralytic syndrome following cerebral infarction, bilateral: Secondary | ICD-10-CM | POA: Diagnosis not present

## 2020-05-23 DIAGNOSIS — Z7401 Bed confinement status: Secondary | ICD-10-CM | POA: Diagnosis not present

## 2020-05-23 DIAGNOSIS — Z86718 Personal history of other venous thrombosis and embolism: Secondary | ICD-10-CM | POA: Diagnosis not present

## 2020-05-23 DIAGNOSIS — G8250 Quadriplegia, unspecified: Secondary | ICD-10-CM | POA: Diagnosis not present

## 2020-05-23 DIAGNOSIS — G40909 Epilepsy, unspecified, not intractable, without status epilepticus: Secondary | ICD-10-CM | POA: Diagnosis not present

## 2020-05-23 DIAGNOSIS — R000 Tachycardia, unspecified: Secondary | ICD-10-CM | POA: Diagnosis not present

## 2020-05-23 DIAGNOSIS — E1165 Type 2 diabetes mellitus with hyperglycemia: Secondary | ICD-10-CM | POA: Diagnosis not present

## 2020-05-23 DIAGNOSIS — I69320 Aphasia following cerebral infarction: Secondary | ICD-10-CM | POA: Diagnosis not present

## 2020-05-23 DIAGNOSIS — Z87440 Personal history of urinary (tract) infections: Secondary | ICD-10-CM | POA: Diagnosis not present

## 2020-05-23 DIAGNOSIS — I69311 Memory deficit following cerebral infarction: Secondary | ICD-10-CM | POA: Diagnosis not present

## 2020-05-23 DIAGNOSIS — I10 Essential (primary) hypertension: Secondary | ICD-10-CM | POA: Diagnosis not present

## 2020-05-23 DIAGNOSIS — D649 Anemia, unspecified: Secondary | ICD-10-CM | POA: Diagnosis not present

## 2020-05-23 DIAGNOSIS — I6931 Attention and concentration deficit following cerebral infarction: Secondary | ICD-10-CM | POA: Diagnosis not present

## 2020-05-26 ENCOUNTER — Telehealth: Payer: Self-pay

## 2020-05-26 DIAGNOSIS — I6931 Attention and concentration deficit following cerebral infarction: Secondary | ICD-10-CM | POA: Diagnosis not present

## 2020-05-26 DIAGNOSIS — G8250 Quadriplegia, unspecified: Secondary | ICD-10-CM | POA: Diagnosis not present

## 2020-05-26 DIAGNOSIS — E1165 Type 2 diabetes mellitus with hyperglycemia: Secondary | ICD-10-CM | POA: Diagnosis not present

## 2020-05-26 DIAGNOSIS — G4733 Obstructive sleep apnea (adult) (pediatric): Secondary | ICD-10-CM | POA: Diagnosis not present

## 2020-05-26 DIAGNOSIS — I69311 Memory deficit following cerebral infarction: Secondary | ICD-10-CM | POA: Diagnosis not present

## 2020-05-26 DIAGNOSIS — I69320 Aphasia following cerebral infarction: Secondary | ICD-10-CM | POA: Diagnosis not present

## 2020-05-26 DIAGNOSIS — D649 Anemia, unspecified: Secondary | ICD-10-CM | POA: Diagnosis not present

## 2020-05-26 DIAGNOSIS — G40909 Epilepsy, unspecified, not intractable, without status epilepticus: Secondary | ICD-10-CM | POA: Diagnosis not present

## 2020-05-26 DIAGNOSIS — R000 Tachycardia, unspecified: Secondary | ICD-10-CM | POA: Diagnosis not present

## 2020-05-26 DIAGNOSIS — I69365 Other paralytic syndrome following cerebral infarction, bilateral: Secondary | ICD-10-CM | POA: Diagnosis not present

## 2020-05-26 DIAGNOSIS — Z86718 Personal history of other venous thrombosis and embolism: Secondary | ICD-10-CM | POA: Diagnosis not present

## 2020-05-26 DIAGNOSIS — Z7401 Bed confinement status: Secondary | ICD-10-CM | POA: Diagnosis not present

## 2020-05-26 DIAGNOSIS — I10 Essential (primary) hypertension: Secondary | ICD-10-CM | POA: Diagnosis not present

## 2020-05-26 DIAGNOSIS — R339 Retention of urine, unspecified: Secondary | ICD-10-CM | POA: Diagnosis not present

## 2020-05-26 DIAGNOSIS — Z87440 Personal history of urinary (tract) infections: Secondary | ICD-10-CM | POA: Diagnosis not present

## 2020-05-26 NOTE — Telephone Encounter (Signed)
Volunteer called patient on behalf of Palliative Care. Patient is doing well at this time.  

## 2020-05-28 DIAGNOSIS — G40909 Epilepsy, unspecified, not intractable, without status epilepticus: Secondary | ICD-10-CM | POA: Diagnosis not present

## 2020-05-28 DIAGNOSIS — R339 Retention of urine, unspecified: Secondary | ICD-10-CM | POA: Diagnosis not present

## 2020-05-28 DIAGNOSIS — Z7401 Bed confinement status: Secondary | ICD-10-CM | POA: Diagnosis not present

## 2020-05-28 DIAGNOSIS — Z86718 Personal history of other venous thrombosis and embolism: Secondary | ICD-10-CM | POA: Diagnosis not present

## 2020-05-28 DIAGNOSIS — I10 Essential (primary) hypertension: Secondary | ICD-10-CM | POA: Diagnosis not present

## 2020-05-28 DIAGNOSIS — G8250 Quadriplegia, unspecified: Secondary | ICD-10-CM | POA: Diagnosis not present

## 2020-05-28 DIAGNOSIS — I69320 Aphasia following cerebral infarction: Secondary | ICD-10-CM | POA: Diagnosis not present

## 2020-05-28 DIAGNOSIS — G4733 Obstructive sleep apnea (adult) (pediatric): Secondary | ICD-10-CM | POA: Diagnosis not present

## 2020-05-28 DIAGNOSIS — R000 Tachycardia, unspecified: Secondary | ICD-10-CM | POA: Diagnosis not present

## 2020-05-28 DIAGNOSIS — I69311 Memory deficit following cerebral infarction: Secondary | ICD-10-CM | POA: Diagnosis not present

## 2020-05-28 DIAGNOSIS — Z87440 Personal history of urinary (tract) infections: Secondary | ICD-10-CM | POA: Diagnosis not present

## 2020-05-28 DIAGNOSIS — I6931 Attention and concentration deficit following cerebral infarction: Secondary | ICD-10-CM | POA: Diagnosis not present

## 2020-05-28 DIAGNOSIS — I69365 Other paralytic syndrome following cerebral infarction, bilateral: Secondary | ICD-10-CM | POA: Diagnosis not present

## 2020-05-28 DIAGNOSIS — E1165 Type 2 diabetes mellitus with hyperglycemia: Secondary | ICD-10-CM | POA: Diagnosis not present

## 2020-05-28 DIAGNOSIS — D649 Anemia, unspecified: Secondary | ICD-10-CM | POA: Diagnosis not present

## 2020-06-01 DIAGNOSIS — R2689 Other abnormalities of gait and mobility: Secondary | ICD-10-CM | POA: Diagnosis not present

## 2020-06-01 DIAGNOSIS — I6389 Other cerebral infarction: Secondary | ICD-10-CM | POA: Diagnosis not present

## 2020-06-01 DIAGNOSIS — R262 Difficulty in walking, not elsewhere classified: Secondary | ICD-10-CM | POA: Diagnosis not present

## 2020-06-02 DIAGNOSIS — L8994 Pressure ulcer of unspecified site, stage 4: Secondary | ICD-10-CM | POA: Diagnosis not present

## 2020-06-02 DIAGNOSIS — I6389 Other cerebral infarction: Secondary | ICD-10-CM | POA: Diagnosis not present

## 2020-06-02 DIAGNOSIS — I10 Essential (primary) hypertension: Secondary | ICD-10-CM | POA: Diagnosis not present

## 2020-06-02 DIAGNOSIS — Z7401 Bed confinement status: Secondary | ICD-10-CM | POA: Diagnosis not present

## 2020-06-02 DIAGNOSIS — G40909 Epilepsy, unspecified, not intractable, without status epilepticus: Secondary | ICD-10-CM | POA: Diagnosis not present

## 2020-06-02 DIAGNOSIS — I6931 Attention and concentration deficit following cerebral infarction: Secondary | ICD-10-CM | POA: Diagnosis not present

## 2020-06-02 DIAGNOSIS — R339 Retention of urine, unspecified: Secondary | ICD-10-CM | POA: Diagnosis not present

## 2020-06-02 DIAGNOSIS — I69320 Aphasia following cerebral infarction: Secondary | ICD-10-CM | POA: Diagnosis not present

## 2020-06-02 DIAGNOSIS — I69365 Other paralytic syndrome following cerebral infarction, bilateral: Secondary | ICD-10-CM | POA: Diagnosis not present

## 2020-06-02 DIAGNOSIS — G4733 Obstructive sleep apnea (adult) (pediatric): Secondary | ICD-10-CM | POA: Diagnosis not present

## 2020-06-02 DIAGNOSIS — G8250 Quadriplegia, unspecified: Secondary | ICD-10-CM | POA: Diagnosis not present

## 2020-06-02 DIAGNOSIS — D649 Anemia, unspecified: Secondary | ICD-10-CM | POA: Diagnosis not present

## 2020-06-02 DIAGNOSIS — I69311 Memory deficit following cerebral infarction: Secondary | ICD-10-CM | POA: Diagnosis not present

## 2020-06-02 DIAGNOSIS — Z87440 Personal history of urinary (tract) infections: Secondary | ICD-10-CM | POA: Diagnosis not present

## 2020-06-02 DIAGNOSIS — R000 Tachycardia, unspecified: Secondary | ICD-10-CM | POA: Diagnosis not present

## 2020-06-02 DIAGNOSIS — E1165 Type 2 diabetes mellitus with hyperglycemia: Secondary | ICD-10-CM | POA: Diagnosis not present

## 2020-06-02 DIAGNOSIS — Z86718 Personal history of other venous thrombosis and embolism: Secondary | ICD-10-CM | POA: Diagnosis not present

## 2020-06-03 DIAGNOSIS — Z87440 Personal history of urinary (tract) infections: Secondary | ICD-10-CM | POA: Diagnosis not present

## 2020-06-03 DIAGNOSIS — I69365 Other paralytic syndrome following cerebral infarction, bilateral: Secondary | ICD-10-CM | POA: Diagnosis not present

## 2020-06-03 DIAGNOSIS — I10 Essential (primary) hypertension: Secondary | ICD-10-CM | POA: Diagnosis not present

## 2020-06-03 DIAGNOSIS — Z86718 Personal history of other venous thrombosis and embolism: Secondary | ICD-10-CM | POA: Diagnosis not present

## 2020-06-03 DIAGNOSIS — G40909 Epilepsy, unspecified, not intractable, without status epilepticus: Secondary | ICD-10-CM | POA: Diagnosis not present

## 2020-06-03 DIAGNOSIS — R339 Retention of urine, unspecified: Secondary | ICD-10-CM | POA: Diagnosis not present

## 2020-06-03 DIAGNOSIS — I69311 Memory deficit following cerebral infarction: Secondary | ICD-10-CM | POA: Diagnosis not present

## 2020-06-03 DIAGNOSIS — G4733 Obstructive sleep apnea (adult) (pediatric): Secondary | ICD-10-CM | POA: Diagnosis not present

## 2020-06-03 DIAGNOSIS — I69320 Aphasia following cerebral infarction: Secondary | ICD-10-CM | POA: Diagnosis not present

## 2020-06-03 DIAGNOSIS — R000 Tachycardia, unspecified: Secondary | ICD-10-CM | POA: Diagnosis not present

## 2020-06-03 DIAGNOSIS — D649 Anemia, unspecified: Secondary | ICD-10-CM | POA: Diagnosis not present

## 2020-06-03 DIAGNOSIS — G8250 Quadriplegia, unspecified: Secondary | ICD-10-CM | POA: Diagnosis not present

## 2020-06-03 DIAGNOSIS — Z7401 Bed confinement status: Secondary | ICD-10-CM | POA: Diagnosis not present

## 2020-06-03 DIAGNOSIS — E1165 Type 2 diabetes mellitus with hyperglycemia: Secondary | ICD-10-CM | POA: Diagnosis not present

## 2020-06-03 DIAGNOSIS — I6931 Attention and concentration deficit following cerebral infarction: Secondary | ICD-10-CM | POA: Diagnosis not present

## 2020-06-05 DIAGNOSIS — I69320 Aphasia following cerebral infarction: Secondary | ICD-10-CM | POA: Diagnosis not present

## 2020-06-05 DIAGNOSIS — Z87440 Personal history of urinary (tract) infections: Secondary | ICD-10-CM | POA: Diagnosis not present

## 2020-06-05 DIAGNOSIS — E1165 Type 2 diabetes mellitus with hyperglycemia: Secondary | ICD-10-CM | POA: Diagnosis not present

## 2020-06-05 DIAGNOSIS — R339 Retention of urine, unspecified: Secondary | ICD-10-CM | POA: Diagnosis not present

## 2020-06-05 DIAGNOSIS — I69311 Memory deficit following cerebral infarction: Secondary | ICD-10-CM | POA: Diagnosis not present

## 2020-06-05 DIAGNOSIS — R000 Tachycardia, unspecified: Secondary | ICD-10-CM | POA: Diagnosis not present

## 2020-06-05 DIAGNOSIS — G40909 Epilepsy, unspecified, not intractable, without status epilepticus: Secondary | ICD-10-CM | POA: Diagnosis not present

## 2020-06-05 DIAGNOSIS — Z86718 Personal history of other venous thrombosis and embolism: Secondary | ICD-10-CM | POA: Diagnosis not present

## 2020-06-05 DIAGNOSIS — G4733 Obstructive sleep apnea (adult) (pediatric): Secondary | ICD-10-CM | POA: Diagnosis not present

## 2020-06-05 DIAGNOSIS — D649 Anemia, unspecified: Secondary | ICD-10-CM | POA: Diagnosis not present

## 2020-06-05 DIAGNOSIS — I6931 Attention and concentration deficit following cerebral infarction: Secondary | ICD-10-CM | POA: Diagnosis not present

## 2020-06-05 DIAGNOSIS — I10 Essential (primary) hypertension: Secondary | ICD-10-CM | POA: Diagnosis not present

## 2020-06-05 DIAGNOSIS — Z7401 Bed confinement status: Secondary | ICD-10-CM | POA: Diagnosis not present

## 2020-06-05 DIAGNOSIS — I69365 Other paralytic syndrome following cerebral infarction, bilateral: Secondary | ICD-10-CM | POA: Diagnosis not present

## 2020-06-05 DIAGNOSIS — G8250 Quadriplegia, unspecified: Secondary | ICD-10-CM | POA: Diagnosis not present

## 2020-06-06 DIAGNOSIS — T8189XA Other complications of procedures, not elsewhere classified, initial encounter: Secondary | ICD-10-CM | POA: Diagnosis not present

## 2020-06-06 DIAGNOSIS — L89154 Pressure ulcer of sacral region, stage 4: Secondary | ICD-10-CM | POA: Diagnosis not present

## 2020-06-08 DIAGNOSIS — E1165 Type 2 diabetes mellitus with hyperglycemia: Secondary | ICD-10-CM | POA: Diagnosis not present

## 2020-06-08 DIAGNOSIS — G8250 Quadriplegia, unspecified: Secondary | ICD-10-CM | POA: Diagnosis not present

## 2020-06-08 DIAGNOSIS — I6931 Attention and concentration deficit following cerebral infarction: Secondary | ICD-10-CM | POA: Diagnosis not present

## 2020-06-08 DIAGNOSIS — R000 Tachycardia, unspecified: Secondary | ICD-10-CM | POA: Diagnosis not present

## 2020-06-08 DIAGNOSIS — I69365 Other paralytic syndrome following cerebral infarction, bilateral: Secondary | ICD-10-CM | POA: Diagnosis not present

## 2020-06-08 DIAGNOSIS — I10 Essential (primary) hypertension: Secondary | ICD-10-CM | POA: Diagnosis not present

## 2020-06-08 DIAGNOSIS — D649 Anemia, unspecified: Secondary | ICD-10-CM | POA: Diagnosis not present

## 2020-06-08 DIAGNOSIS — G4733 Obstructive sleep apnea (adult) (pediatric): Secondary | ICD-10-CM | POA: Diagnosis not present

## 2020-06-08 DIAGNOSIS — Z7401 Bed confinement status: Secondary | ICD-10-CM | POA: Diagnosis not present

## 2020-06-08 DIAGNOSIS — Z86718 Personal history of other venous thrombosis and embolism: Secondary | ICD-10-CM | POA: Diagnosis not present

## 2020-06-08 DIAGNOSIS — I69320 Aphasia following cerebral infarction: Secondary | ICD-10-CM | POA: Diagnosis not present

## 2020-06-08 DIAGNOSIS — R339 Retention of urine, unspecified: Secondary | ICD-10-CM | POA: Diagnosis not present

## 2020-06-08 DIAGNOSIS — I69311 Memory deficit following cerebral infarction: Secondary | ICD-10-CM | POA: Diagnosis not present

## 2020-06-08 DIAGNOSIS — G40909 Epilepsy, unspecified, not intractable, without status epilepticus: Secondary | ICD-10-CM | POA: Diagnosis not present

## 2020-06-08 DIAGNOSIS — Z87440 Personal history of urinary (tract) infections: Secondary | ICD-10-CM | POA: Diagnosis not present

## 2020-06-09 DIAGNOSIS — I10 Essential (primary) hypertension: Secondary | ICD-10-CM | POA: Diagnosis not present

## 2020-06-09 DIAGNOSIS — D649 Anemia, unspecified: Secondary | ICD-10-CM | POA: Diagnosis not present

## 2020-06-09 DIAGNOSIS — R000 Tachycardia, unspecified: Secondary | ICD-10-CM | POA: Diagnosis not present

## 2020-06-09 DIAGNOSIS — Z7401 Bed confinement status: Secondary | ICD-10-CM | POA: Diagnosis not present

## 2020-06-09 DIAGNOSIS — G8250 Quadriplegia, unspecified: Secondary | ICD-10-CM | POA: Diagnosis not present

## 2020-06-09 DIAGNOSIS — Z87440 Personal history of urinary (tract) infections: Secondary | ICD-10-CM | POA: Diagnosis not present

## 2020-06-09 DIAGNOSIS — I69365 Other paralytic syndrome following cerebral infarction, bilateral: Secondary | ICD-10-CM | POA: Diagnosis not present

## 2020-06-09 DIAGNOSIS — I69311 Memory deficit following cerebral infarction: Secondary | ICD-10-CM | POA: Diagnosis not present

## 2020-06-09 DIAGNOSIS — G40909 Epilepsy, unspecified, not intractable, without status epilepticus: Secondary | ICD-10-CM | POA: Diagnosis not present

## 2020-06-09 DIAGNOSIS — I69320 Aphasia following cerebral infarction: Secondary | ICD-10-CM | POA: Diagnosis not present

## 2020-06-09 DIAGNOSIS — G4733 Obstructive sleep apnea (adult) (pediatric): Secondary | ICD-10-CM | POA: Diagnosis not present

## 2020-06-09 DIAGNOSIS — I6931 Attention and concentration deficit following cerebral infarction: Secondary | ICD-10-CM | POA: Diagnosis not present

## 2020-06-09 DIAGNOSIS — Z86718 Personal history of other venous thrombosis and embolism: Secondary | ICD-10-CM | POA: Diagnosis not present

## 2020-06-09 DIAGNOSIS — R339 Retention of urine, unspecified: Secondary | ICD-10-CM | POA: Diagnosis not present

## 2020-06-09 DIAGNOSIS — E1165 Type 2 diabetes mellitus with hyperglycemia: Secondary | ICD-10-CM | POA: Diagnosis not present

## 2020-06-11 DIAGNOSIS — D649 Anemia, unspecified: Secondary | ICD-10-CM | POA: Diagnosis not present

## 2020-06-11 DIAGNOSIS — E1165 Type 2 diabetes mellitus with hyperglycemia: Secondary | ICD-10-CM | POA: Diagnosis not present

## 2020-06-11 DIAGNOSIS — Z7401 Bed confinement status: Secondary | ICD-10-CM | POA: Diagnosis not present

## 2020-06-11 DIAGNOSIS — I69365 Other paralytic syndrome following cerebral infarction, bilateral: Secondary | ICD-10-CM | POA: Diagnosis not present

## 2020-06-11 DIAGNOSIS — I69311 Memory deficit following cerebral infarction: Secondary | ICD-10-CM | POA: Diagnosis not present

## 2020-06-11 DIAGNOSIS — G8250 Quadriplegia, unspecified: Secondary | ICD-10-CM | POA: Diagnosis not present

## 2020-06-11 DIAGNOSIS — G4733 Obstructive sleep apnea (adult) (pediatric): Secondary | ICD-10-CM | POA: Diagnosis not present

## 2020-06-11 DIAGNOSIS — G40909 Epilepsy, unspecified, not intractable, without status epilepticus: Secondary | ICD-10-CM | POA: Diagnosis not present

## 2020-06-11 DIAGNOSIS — R339 Retention of urine, unspecified: Secondary | ICD-10-CM | POA: Diagnosis not present

## 2020-06-11 DIAGNOSIS — I69320 Aphasia following cerebral infarction: Secondary | ICD-10-CM | POA: Diagnosis not present

## 2020-06-11 DIAGNOSIS — Z86718 Personal history of other venous thrombosis and embolism: Secondary | ICD-10-CM | POA: Diagnosis not present

## 2020-06-11 DIAGNOSIS — Z87440 Personal history of urinary (tract) infections: Secondary | ICD-10-CM | POA: Diagnosis not present

## 2020-06-11 DIAGNOSIS — I10 Essential (primary) hypertension: Secondary | ICD-10-CM | POA: Diagnosis not present

## 2020-06-11 DIAGNOSIS — R000 Tachycardia, unspecified: Secondary | ICD-10-CM | POA: Diagnosis not present

## 2020-06-11 DIAGNOSIS — I6931 Attention and concentration deficit following cerebral infarction: Secondary | ICD-10-CM | POA: Diagnosis not present

## 2020-06-12 DIAGNOSIS — R339 Retention of urine, unspecified: Secondary | ICD-10-CM | POA: Diagnosis not present

## 2020-06-12 DIAGNOSIS — I6931 Attention and concentration deficit following cerebral infarction: Secondary | ICD-10-CM | POA: Diagnosis not present

## 2020-06-12 DIAGNOSIS — G8250 Quadriplegia, unspecified: Secondary | ICD-10-CM | POA: Diagnosis not present

## 2020-06-12 DIAGNOSIS — I69311 Memory deficit following cerebral infarction: Secondary | ICD-10-CM | POA: Diagnosis not present

## 2020-06-12 DIAGNOSIS — G4733 Obstructive sleep apnea (adult) (pediatric): Secondary | ICD-10-CM | POA: Diagnosis not present

## 2020-06-12 DIAGNOSIS — Z7401 Bed confinement status: Secondary | ICD-10-CM | POA: Diagnosis not present

## 2020-06-12 DIAGNOSIS — I69365 Other paralytic syndrome following cerebral infarction, bilateral: Secondary | ICD-10-CM | POA: Diagnosis not present

## 2020-06-12 DIAGNOSIS — Z87440 Personal history of urinary (tract) infections: Secondary | ICD-10-CM | POA: Diagnosis not present

## 2020-06-12 DIAGNOSIS — I69320 Aphasia following cerebral infarction: Secondary | ICD-10-CM | POA: Diagnosis not present

## 2020-06-12 DIAGNOSIS — E1165 Type 2 diabetes mellitus with hyperglycemia: Secondary | ICD-10-CM | POA: Diagnosis not present

## 2020-06-12 DIAGNOSIS — Z86718 Personal history of other venous thrombosis and embolism: Secondary | ICD-10-CM | POA: Diagnosis not present

## 2020-06-12 DIAGNOSIS — R000 Tachycardia, unspecified: Secondary | ICD-10-CM | POA: Diagnosis not present

## 2020-06-12 DIAGNOSIS — I10 Essential (primary) hypertension: Secondary | ICD-10-CM | POA: Diagnosis not present

## 2020-06-12 DIAGNOSIS — G40909 Epilepsy, unspecified, not intractable, without status epilepticus: Secondary | ICD-10-CM | POA: Diagnosis not present

## 2020-06-12 DIAGNOSIS — D649 Anemia, unspecified: Secondary | ICD-10-CM | POA: Diagnosis not present

## 2020-06-15 DIAGNOSIS — L8994 Pressure ulcer of unspecified site, stage 4: Secondary | ICD-10-CM | POA: Diagnosis not present

## 2020-06-15 DIAGNOSIS — G4733 Obstructive sleep apnea (adult) (pediatric): Secondary | ICD-10-CM | POA: Diagnosis not present

## 2020-06-15 DIAGNOSIS — I6389 Other cerebral infarction: Secondary | ICD-10-CM | POA: Diagnosis not present

## 2020-06-15 DIAGNOSIS — I69320 Aphasia following cerebral infarction: Secondary | ICD-10-CM | POA: Diagnosis not present

## 2020-06-15 DIAGNOSIS — G8250 Quadriplegia, unspecified: Secondary | ICD-10-CM | POA: Diagnosis not present

## 2020-06-15 DIAGNOSIS — I69365 Other paralytic syndrome following cerebral infarction, bilateral: Secondary | ICD-10-CM | POA: Diagnosis not present

## 2020-06-15 DIAGNOSIS — I69311 Memory deficit following cerebral infarction: Secondary | ICD-10-CM | POA: Diagnosis not present

## 2020-06-16 DIAGNOSIS — I69320 Aphasia following cerebral infarction: Secondary | ICD-10-CM | POA: Diagnosis not present

## 2020-06-16 DIAGNOSIS — I6931 Attention and concentration deficit following cerebral infarction: Secondary | ICD-10-CM | POA: Diagnosis not present

## 2020-06-16 DIAGNOSIS — G40909 Epilepsy, unspecified, not intractable, without status epilepticus: Secondary | ICD-10-CM | POA: Diagnosis not present

## 2020-06-16 DIAGNOSIS — G4733 Obstructive sleep apnea (adult) (pediatric): Secondary | ICD-10-CM | POA: Diagnosis not present

## 2020-06-16 DIAGNOSIS — Z87440 Personal history of urinary (tract) infections: Secondary | ICD-10-CM | POA: Diagnosis not present

## 2020-06-16 DIAGNOSIS — I10 Essential (primary) hypertension: Secondary | ICD-10-CM | POA: Diagnosis not present

## 2020-06-16 DIAGNOSIS — R339 Retention of urine, unspecified: Secondary | ICD-10-CM | POA: Diagnosis not present

## 2020-06-16 DIAGNOSIS — R000 Tachycardia, unspecified: Secondary | ICD-10-CM | POA: Diagnosis not present

## 2020-06-16 DIAGNOSIS — Z7401 Bed confinement status: Secondary | ICD-10-CM | POA: Diagnosis not present

## 2020-06-16 DIAGNOSIS — E1165 Type 2 diabetes mellitus with hyperglycemia: Secondary | ICD-10-CM | POA: Diagnosis not present

## 2020-06-16 DIAGNOSIS — D649 Anemia, unspecified: Secondary | ICD-10-CM | POA: Diagnosis not present

## 2020-06-16 DIAGNOSIS — I69311 Memory deficit following cerebral infarction: Secondary | ICD-10-CM | POA: Diagnosis not present

## 2020-06-16 DIAGNOSIS — Z86718 Personal history of other venous thrombosis and embolism: Secondary | ICD-10-CM | POA: Diagnosis not present

## 2020-06-16 DIAGNOSIS — I69365 Other paralytic syndrome following cerebral infarction, bilateral: Secondary | ICD-10-CM | POA: Diagnosis not present

## 2020-06-16 DIAGNOSIS — G8250 Quadriplegia, unspecified: Secondary | ICD-10-CM | POA: Diagnosis not present

## 2020-06-17 DIAGNOSIS — E1165 Type 2 diabetes mellitus with hyperglycemia: Secondary | ICD-10-CM | POA: Diagnosis not present

## 2020-06-17 DIAGNOSIS — I6931 Attention and concentration deficit following cerebral infarction: Secondary | ICD-10-CM | POA: Diagnosis not present

## 2020-06-17 DIAGNOSIS — I69311 Memory deficit following cerebral infarction: Secondary | ICD-10-CM | POA: Diagnosis not present

## 2020-06-17 DIAGNOSIS — Z87440 Personal history of urinary (tract) infections: Secondary | ICD-10-CM | POA: Diagnosis not present

## 2020-06-17 DIAGNOSIS — D649 Anemia, unspecified: Secondary | ICD-10-CM | POA: Diagnosis not present

## 2020-06-17 DIAGNOSIS — G40909 Epilepsy, unspecified, not intractable, without status epilepticus: Secondary | ICD-10-CM | POA: Diagnosis not present

## 2020-06-17 DIAGNOSIS — Z7401 Bed confinement status: Secondary | ICD-10-CM | POA: Diagnosis not present

## 2020-06-17 DIAGNOSIS — R339 Retention of urine, unspecified: Secondary | ICD-10-CM | POA: Diagnosis not present

## 2020-06-17 DIAGNOSIS — Z86718 Personal history of other venous thrombosis and embolism: Secondary | ICD-10-CM | POA: Diagnosis not present

## 2020-06-17 DIAGNOSIS — I10 Essential (primary) hypertension: Secondary | ICD-10-CM | POA: Diagnosis not present

## 2020-06-17 DIAGNOSIS — G4733 Obstructive sleep apnea (adult) (pediatric): Secondary | ICD-10-CM | POA: Diagnosis not present

## 2020-06-17 DIAGNOSIS — I69365 Other paralytic syndrome following cerebral infarction, bilateral: Secondary | ICD-10-CM | POA: Diagnosis not present

## 2020-06-17 DIAGNOSIS — R000 Tachycardia, unspecified: Secondary | ICD-10-CM | POA: Diagnosis not present

## 2020-06-17 DIAGNOSIS — I69320 Aphasia following cerebral infarction: Secondary | ICD-10-CM | POA: Diagnosis not present

## 2020-06-17 DIAGNOSIS — G8250 Quadriplegia, unspecified: Secondary | ICD-10-CM | POA: Diagnosis not present

## 2020-06-18 DIAGNOSIS — I69365 Other paralytic syndrome following cerebral infarction, bilateral: Secondary | ICD-10-CM | POA: Diagnosis not present

## 2020-06-18 DIAGNOSIS — Z86718 Personal history of other venous thrombosis and embolism: Secondary | ICD-10-CM | POA: Diagnosis not present

## 2020-06-18 DIAGNOSIS — G8250 Quadriplegia, unspecified: Secondary | ICD-10-CM | POA: Diagnosis not present

## 2020-06-18 DIAGNOSIS — D649 Anemia, unspecified: Secondary | ICD-10-CM | POA: Diagnosis not present

## 2020-06-18 DIAGNOSIS — R000 Tachycardia, unspecified: Secondary | ICD-10-CM | POA: Diagnosis not present

## 2020-06-18 DIAGNOSIS — E1165 Type 2 diabetes mellitus with hyperglycemia: Secondary | ICD-10-CM | POA: Diagnosis not present

## 2020-06-18 DIAGNOSIS — G40909 Epilepsy, unspecified, not intractable, without status epilepticus: Secondary | ICD-10-CM | POA: Diagnosis not present

## 2020-06-18 DIAGNOSIS — Z87440 Personal history of urinary (tract) infections: Secondary | ICD-10-CM | POA: Diagnosis not present

## 2020-06-18 DIAGNOSIS — I69311 Memory deficit following cerebral infarction: Secondary | ICD-10-CM | POA: Diagnosis not present

## 2020-06-18 DIAGNOSIS — R339 Retention of urine, unspecified: Secondary | ICD-10-CM | POA: Diagnosis not present

## 2020-06-18 DIAGNOSIS — I6931 Attention and concentration deficit following cerebral infarction: Secondary | ICD-10-CM | POA: Diagnosis not present

## 2020-06-18 DIAGNOSIS — I69320 Aphasia following cerebral infarction: Secondary | ICD-10-CM | POA: Diagnosis not present

## 2020-06-18 DIAGNOSIS — I10 Essential (primary) hypertension: Secondary | ICD-10-CM | POA: Diagnosis not present

## 2020-06-18 DIAGNOSIS — Z7401 Bed confinement status: Secondary | ICD-10-CM | POA: Diagnosis not present

## 2020-06-18 DIAGNOSIS — G4733 Obstructive sleep apnea (adult) (pediatric): Secondary | ICD-10-CM | POA: Diagnosis not present

## 2020-06-19 DIAGNOSIS — R000 Tachycardia, unspecified: Secondary | ICD-10-CM | POA: Diagnosis not present

## 2020-06-19 DIAGNOSIS — Z86718 Personal history of other venous thrombosis and embolism: Secondary | ICD-10-CM | POA: Diagnosis not present

## 2020-06-19 DIAGNOSIS — I69365 Other paralytic syndrome following cerebral infarction, bilateral: Secondary | ICD-10-CM | POA: Diagnosis not present

## 2020-06-19 DIAGNOSIS — G4733 Obstructive sleep apnea (adult) (pediatric): Secondary | ICD-10-CM | POA: Diagnosis not present

## 2020-06-19 DIAGNOSIS — G8250 Quadriplegia, unspecified: Secondary | ICD-10-CM | POA: Diagnosis not present

## 2020-06-19 DIAGNOSIS — G40909 Epilepsy, unspecified, not intractable, without status epilepticus: Secondary | ICD-10-CM | POA: Diagnosis not present

## 2020-06-19 DIAGNOSIS — Z87440 Personal history of urinary (tract) infections: Secondary | ICD-10-CM | POA: Diagnosis not present

## 2020-06-19 DIAGNOSIS — Z7401 Bed confinement status: Secondary | ICD-10-CM | POA: Diagnosis not present

## 2020-06-19 DIAGNOSIS — R339 Retention of urine, unspecified: Secondary | ICD-10-CM | POA: Diagnosis not present

## 2020-06-19 DIAGNOSIS — E1165 Type 2 diabetes mellitus with hyperglycemia: Secondary | ICD-10-CM | POA: Diagnosis not present

## 2020-06-19 DIAGNOSIS — I69311 Memory deficit following cerebral infarction: Secondary | ICD-10-CM | POA: Diagnosis not present

## 2020-06-19 DIAGNOSIS — I69320 Aphasia following cerebral infarction: Secondary | ICD-10-CM | POA: Diagnosis not present

## 2020-06-19 DIAGNOSIS — D649 Anemia, unspecified: Secondary | ICD-10-CM | POA: Diagnosis not present

## 2020-06-19 DIAGNOSIS — I10 Essential (primary) hypertension: Secondary | ICD-10-CM | POA: Diagnosis not present

## 2020-06-19 DIAGNOSIS — I6931 Attention and concentration deficit following cerebral infarction: Secondary | ICD-10-CM | POA: Diagnosis not present

## 2020-06-22 DIAGNOSIS — G4733 Obstructive sleep apnea (adult) (pediatric): Secondary | ICD-10-CM | POA: Diagnosis not present

## 2020-06-23 DIAGNOSIS — I10 Essential (primary) hypertension: Secondary | ICD-10-CM | POA: Diagnosis not present

## 2020-06-23 DIAGNOSIS — D649 Anemia, unspecified: Secondary | ICD-10-CM | POA: Diagnosis not present

## 2020-06-23 DIAGNOSIS — Z87440 Personal history of urinary (tract) infections: Secondary | ICD-10-CM | POA: Diagnosis not present

## 2020-06-23 DIAGNOSIS — Z86718 Personal history of other venous thrombosis and embolism: Secondary | ICD-10-CM | POA: Diagnosis not present

## 2020-06-23 DIAGNOSIS — R000 Tachycardia, unspecified: Secondary | ICD-10-CM | POA: Diagnosis not present

## 2020-06-23 DIAGNOSIS — G8250 Quadriplegia, unspecified: Secondary | ICD-10-CM | POA: Diagnosis not present

## 2020-06-23 DIAGNOSIS — G4733 Obstructive sleep apnea (adult) (pediatric): Secondary | ICD-10-CM | POA: Diagnosis not present

## 2020-06-23 DIAGNOSIS — I6931 Attention and concentration deficit following cerebral infarction: Secondary | ICD-10-CM | POA: Diagnosis not present

## 2020-06-23 DIAGNOSIS — E1165 Type 2 diabetes mellitus with hyperglycemia: Secondary | ICD-10-CM | POA: Diagnosis not present

## 2020-06-23 DIAGNOSIS — R339 Retention of urine, unspecified: Secondary | ICD-10-CM | POA: Diagnosis not present

## 2020-06-23 DIAGNOSIS — I69365 Other paralytic syndrome following cerebral infarction, bilateral: Secondary | ICD-10-CM | POA: Diagnosis not present

## 2020-06-23 DIAGNOSIS — I69311 Memory deficit following cerebral infarction: Secondary | ICD-10-CM | POA: Diagnosis not present

## 2020-06-23 DIAGNOSIS — I69320 Aphasia following cerebral infarction: Secondary | ICD-10-CM | POA: Diagnosis not present

## 2020-06-23 DIAGNOSIS — G40909 Epilepsy, unspecified, not intractable, without status epilepticus: Secondary | ICD-10-CM | POA: Diagnosis not present

## 2020-06-23 DIAGNOSIS — Z7401 Bed confinement status: Secondary | ICD-10-CM | POA: Diagnosis not present

## 2020-06-24 DIAGNOSIS — R339 Retention of urine, unspecified: Secondary | ICD-10-CM | POA: Diagnosis not present

## 2020-06-24 DIAGNOSIS — E1165 Type 2 diabetes mellitus with hyperglycemia: Secondary | ICD-10-CM | POA: Diagnosis not present

## 2020-06-24 DIAGNOSIS — G4733 Obstructive sleep apnea (adult) (pediatric): Secondary | ICD-10-CM | POA: Diagnosis not present

## 2020-06-24 DIAGNOSIS — I69365 Other paralytic syndrome following cerebral infarction, bilateral: Secondary | ICD-10-CM | POA: Diagnosis not present

## 2020-06-24 DIAGNOSIS — D649 Anemia, unspecified: Secondary | ICD-10-CM | POA: Diagnosis not present

## 2020-06-24 DIAGNOSIS — G8250 Quadriplegia, unspecified: Secondary | ICD-10-CM | POA: Diagnosis not present

## 2020-06-24 DIAGNOSIS — R000 Tachycardia, unspecified: Secondary | ICD-10-CM | POA: Diagnosis not present

## 2020-06-24 DIAGNOSIS — Z87440 Personal history of urinary (tract) infections: Secondary | ICD-10-CM | POA: Diagnosis not present

## 2020-06-24 DIAGNOSIS — Z86718 Personal history of other venous thrombosis and embolism: Secondary | ICD-10-CM | POA: Diagnosis not present

## 2020-06-24 DIAGNOSIS — I6931 Attention and concentration deficit following cerebral infarction: Secondary | ICD-10-CM | POA: Diagnosis not present

## 2020-06-24 DIAGNOSIS — Z7401 Bed confinement status: Secondary | ICD-10-CM | POA: Diagnosis not present

## 2020-06-24 DIAGNOSIS — I69320 Aphasia following cerebral infarction: Secondary | ICD-10-CM | POA: Diagnosis not present

## 2020-06-24 DIAGNOSIS — I69311 Memory deficit following cerebral infarction: Secondary | ICD-10-CM | POA: Diagnosis not present

## 2020-06-24 DIAGNOSIS — G40909 Epilepsy, unspecified, not intractable, without status epilepticus: Secondary | ICD-10-CM | POA: Diagnosis not present

## 2020-06-24 DIAGNOSIS — I10 Essential (primary) hypertension: Secondary | ICD-10-CM | POA: Diagnosis not present

## 2020-06-25 DIAGNOSIS — Z87440 Personal history of urinary (tract) infections: Secondary | ICD-10-CM | POA: Diagnosis not present

## 2020-06-25 DIAGNOSIS — G8250 Quadriplegia, unspecified: Secondary | ICD-10-CM | POA: Diagnosis not present

## 2020-06-25 DIAGNOSIS — R339 Retention of urine, unspecified: Secondary | ICD-10-CM | POA: Diagnosis not present

## 2020-06-25 DIAGNOSIS — I69365 Other paralytic syndrome following cerebral infarction, bilateral: Secondary | ICD-10-CM | POA: Diagnosis not present

## 2020-06-25 DIAGNOSIS — Z7401 Bed confinement status: Secondary | ICD-10-CM | POA: Diagnosis not present

## 2020-06-25 DIAGNOSIS — I69320 Aphasia following cerebral infarction: Secondary | ICD-10-CM | POA: Diagnosis not present

## 2020-06-25 DIAGNOSIS — G4733 Obstructive sleep apnea (adult) (pediatric): Secondary | ICD-10-CM | POA: Diagnosis not present

## 2020-06-25 DIAGNOSIS — I6931 Attention and concentration deficit following cerebral infarction: Secondary | ICD-10-CM | POA: Diagnosis not present

## 2020-06-25 DIAGNOSIS — G40909 Epilepsy, unspecified, not intractable, without status epilepticus: Secondary | ICD-10-CM | POA: Diagnosis not present

## 2020-06-25 DIAGNOSIS — I10 Essential (primary) hypertension: Secondary | ICD-10-CM | POA: Diagnosis not present

## 2020-06-25 DIAGNOSIS — R000 Tachycardia, unspecified: Secondary | ICD-10-CM | POA: Diagnosis not present

## 2020-06-25 DIAGNOSIS — Z86718 Personal history of other venous thrombosis and embolism: Secondary | ICD-10-CM | POA: Diagnosis not present

## 2020-06-25 DIAGNOSIS — I69311 Memory deficit following cerebral infarction: Secondary | ICD-10-CM | POA: Diagnosis not present

## 2020-06-25 DIAGNOSIS — D649 Anemia, unspecified: Secondary | ICD-10-CM | POA: Diagnosis not present

## 2020-06-25 DIAGNOSIS — E1165 Type 2 diabetes mellitus with hyperglycemia: Secondary | ICD-10-CM | POA: Diagnosis not present

## 2020-06-26 DIAGNOSIS — G4733 Obstructive sleep apnea (adult) (pediatric): Secondary | ICD-10-CM | POA: Diagnosis not present

## 2020-06-26 DIAGNOSIS — G8250 Quadriplegia, unspecified: Secondary | ICD-10-CM | POA: Diagnosis not present

## 2020-06-26 DIAGNOSIS — I10 Essential (primary) hypertension: Secondary | ICD-10-CM | POA: Diagnosis not present

## 2020-06-26 DIAGNOSIS — Z7401 Bed confinement status: Secondary | ICD-10-CM | POA: Diagnosis not present

## 2020-06-26 DIAGNOSIS — G40909 Epilepsy, unspecified, not intractable, without status epilepticus: Secondary | ICD-10-CM | POA: Diagnosis not present

## 2020-06-26 DIAGNOSIS — I6931 Attention and concentration deficit following cerebral infarction: Secondary | ICD-10-CM | POA: Diagnosis not present

## 2020-06-26 DIAGNOSIS — I69320 Aphasia following cerebral infarction: Secondary | ICD-10-CM | POA: Diagnosis not present

## 2020-06-26 DIAGNOSIS — D649 Anemia, unspecified: Secondary | ICD-10-CM | POA: Diagnosis not present

## 2020-06-26 DIAGNOSIS — R000 Tachycardia, unspecified: Secondary | ICD-10-CM | POA: Diagnosis not present

## 2020-06-26 DIAGNOSIS — Z87440 Personal history of urinary (tract) infections: Secondary | ICD-10-CM | POA: Diagnosis not present

## 2020-06-26 DIAGNOSIS — I69311 Memory deficit following cerebral infarction: Secondary | ICD-10-CM | POA: Diagnosis not present

## 2020-06-26 DIAGNOSIS — I69365 Other paralytic syndrome following cerebral infarction, bilateral: Secondary | ICD-10-CM | POA: Diagnosis not present

## 2020-06-26 DIAGNOSIS — Z86718 Personal history of other venous thrombosis and embolism: Secondary | ICD-10-CM | POA: Diagnosis not present

## 2020-06-26 DIAGNOSIS — E1165 Type 2 diabetes mellitus with hyperglycemia: Secondary | ICD-10-CM | POA: Diagnosis not present

## 2020-06-26 DIAGNOSIS — R339 Retention of urine, unspecified: Secondary | ICD-10-CM | POA: Diagnosis not present

## 2020-06-29 DIAGNOSIS — I10 Essential (primary) hypertension: Secondary | ICD-10-CM | POA: Diagnosis not present

## 2020-06-29 DIAGNOSIS — I6931 Attention and concentration deficit following cerebral infarction: Secondary | ICD-10-CM | POA: Diagnosis not present

## 2020-06-29 DIAGNOSIS — D649 Anemia, unspecified: Secondary | ICD-10-CM | POA: Diagnosis not present

## 2020-06-29 DIAGNOSIS — Z86718 Personal history of other venous thrombosis and embolism: Secondary | ICD-10-CM | POA: Diagnosis not present

## 2020-06-29 DIAGNOSIS — G40909 Epilepsy, unspecified, not intractable, without status epilepticus: Secondary | ICD-10-CM | POA: Diagnosis not present

## 2020-06-29 DIAGNOSIS — R339 Retention of urine, unspecified: Secondary | ICD-10-CM | POA: Diagnosis not present

## 2020-06-29 DIAGNOSIS — G8250 Quadriplegia, unspecified: Secondary | ICD-10-CM | POA: Diagnosis not present

## 2020-06-29 DIAGNOSIS — I69311 Memory deficit following cerebral infarction: Secondary | ICD-10-CM | POA: Diagnosis not present

## 2020-06-29 DIAGNOSIS — G4733 Obstructive sleep apnea (adult) (pediatric): Secondary | ICD-10-CM | POA: Diagnosis not present

## 2020-06-29 DIAGNOSIS — E1165 Type 2 diabetes mellitus with hyperglycemia: Secondary | ICD-10-CM | POA: Diagnosis not present

## 2020-06-29 DIAGNOSIS — I69320 Aphasia following cerebral infarction: Secondary | ICD-10-CM | POA: Diagnosis not present

## 2020-06-29 DIAGNOSIS — I69365 Other paralytic syndrome following cerebral infarction, bilateral: Secondary | ICD-10-CM | POA: Diagnosis not present

## 2020-06-29 DIAGNOSIS — R000 Tachycardia, unspecified: Secondary | ICD-10-CM | POA: Diagnosis not present

## 2020-06-29 DIAGNOSIS — Z87440 Personal history of urinary (tract) infections: Secondary | ICD-10-CM | POA: Diagnosis not present

## 2020-06-29 DIAGNOSIS — Z7401 Bed confinement status: Secondary | ICD-10-CM | POA: Diagnosis not present

## 2020-06-30 DIAGNOSIS — Z7401 Bed confinement status: Secondary | ICD-10-CM | POA: Diagnosis not present

## 2020-06-30 DIAGNOSIS — G8250 Quadriplegia, unspecified: Secondary | ICD-10-CM | POA: Diagnosis not present

## 2020-06-30 DIAGNOSIS — R339 Retention of urine, unspecified: Secondary | ICD-10-CM | POA: Diagnosis not present

## 2020-06-30 DIAGNOSIS — D649 Anemia, unspecified: Secondary | ICD-10-CM | POA: Diagnosis not present

## 2020-06-30 DIAGNOSIS — I10 Essential (primary) hypertension: Secondary | ICD-10-CM | POA: Diagnosis not present

## 2020-06-30 DIAGNOSIS — G4733 Obstructive sleep apnea (adult) (pediatric): Secondary | ICD-10-CM | POA: Diagnosis not present

## 2020-06-30 DIAGNOSIS — R000 Tachycardia, unspecified: Secondary | ICD-10-CM | POA: Diagnosis not present

## 2020-06-30 DIAGNOSIS — G40909 Epilepsy, unspecified, not intractable, without status epilepticus: Secondary | ICD-10-CM | POA: Diagnosis not present

## 2020-06-30 DIAGNOSIS — Z86718 Personal history of other venous thrombosis and embolism: Secondary | ICD-10-CM | POA: Diagnosis not present

## 2020-06-30 DIAGNOSIS — I6931 Attention and concentration deficit following cerebral infarction: Secondary | ICD-10-CM | POA: Diagnosis not present

## 2020-06-30 DIAGNOSIS — I69320 Aphasia following cerebral infarction: Secondary | ICD-10-CM | POA: Diagnosis not present

## 2020-06-30 DIAGNOSIS — E1165 Type 2 diabetes mellitus with hyperglycemia: Secondary | ICD-10-CM | POA: Diagnosis not present

## 2020-06-30 DIAGNOSIS — I69365 Other paralytic syndrome following cerebral infarction, bilateral: Secondary | ICD-10-CM | POA: Diagnosis not present

## 2020-06-30 DIAGNOSIS — I69311 Memory deficit following cerebral infarction: Secondary | ICD-10-CM | POA: Diagnosis not present

## 2020-06-30 DIAGNOSIS — Z87440 Personal history of urinary (tract) infections: Secondary | ICD-10-CM | POA: Diagnosis not present

## 2020-07-01 DIAGNOSIS — Z86718 Personal history of other venous thrombosis and embolism: Secondary | ICD-10-CM | POA: Diagnosis not present

## 2020-07-01 DIAGNOSIS — G4733 Obstructive sleep apnea (adult) (pediatric): Secondary | ICD-10-CM | POA: Diagnosis not present

## 2020-07-01 DIAGNOSIS — I69311 Memory deficit following cerebral infarction: Secondary | ICD-10-CM | POA: Diagnosis not present

## 2020-07-01 DIAGNOSIS — Z7401 Bed confinement status: Secondary | ICD-10-CM | POA: Diagnosis not present

## 2020-07-01 DIAGNOSIS — I6389 Other cerebral infarction: Secondary | ICD-10-CM | POA: Diagnosis not present

## 2020-07-01 DIAGNOSIS — R339 Retention of urine, unspecified: Secondary | ICD-10-CM | POA: Diagnosis not present

## 2020-07-01 DIAGNOSIS — D649 Anemia, unspecified: Secondary | ICD-10-CM | POA: Diagnosis not present

## 2020-07-01 DIAGNOSIS — Z87440 Personal history of urinary (tract) infections: Secondary | ICD-10-CM | POA: Diagnosis not present

## 2020-07-01 DIAGNOSIS — R2689 Other abnormalities of gait and mobility: Secondary | ICD-10-CM | POA: Diagnosis not present

## 2020-07-01 DIAGNOSIS — R000 Tachycardia, unspecified: Secondary | ICD-10-CM | POA: Diagnosis not present

## 2020-07-01 DIAGNOSIS — I69365 Other paralytic syndrome following cerebral infarction, bilateral: Secondary | ICD-10-CM | POA: Diagnosis not present

## 2020-07-01 DIAGNOSIS — E1165 Type 2 diabetes mellitus with hyperglycemia: Secondary | ICD-10-CM | POA: Diagnosis not present

## 2020-07-01 DIAGNOSIS — R262 Difficulty in walking, not elsewhere classified: Secondary | ICD-10-CM | POA: Diagnosis not present

## 2020-07-01 DIAGNOSIS — I69320 Aphasia following cerebral infarction: Secondary | ICD-10-CM | POA: Diagnosis not present

## 2020-07-01 DIAGNOSIS — I6931 Attention and concentration deficit following cerebral infarction: Secondary | ICD-10-CM | POA: Diagnosis not present

## 2020-07-01 DIAGNOSIS — I10 Essential (primary) hypertension: Secondary | ICD-10-CM | POA: Diagnosis not present

## 2020-07-01 DIAGNOSIS — G8250 Quadriplegia, unspecified: Secondary | ICD-10-CM | POA: Diagnosis not present

## 2020-07-01 DIAGNOSIS — G40909 Epilepsy, unspecified, not intractable, without status epilepticus: Secondary | ICD-10-CM | POA: Diagnosis not present

## 2020-07-02 DIAGNOSIS — I6931 Attention and concentration deficit following cerebral infarction: Secondary | ICD-10-CM | POA: Diagnosis not present

## 2020-07-02 DIAGNOSIS — I10 Essential (primary) hypertension: Secondary | ICD-10-CM | POA: Diagnosis not present

## 2020-07-02 DIAGNOSIS — I6389 Other cerebral infarction: Secondary | ICD-10-CM | POA: Diagnosis not present

## 2020-07-02 DIAGNOSIS — R339 Retention of urine, unspecified: Secondary | ICD-10-CM | POA: Diagnosis not present

## 2020-07-02 DIAGNOSIS — I69311 Memory deficit following cerebral infarction: Secondary | ICD-10-CM | POA: Diagnosis not present

## 2020-07-02 DIAGNOSIS — L8994 Pressure ulcer of unspecified site, stage 4: Secondary | ICD-10-CM | POA: Diagnosis not present

## 2020-07-02 DIAGNOSIS — Z86718 Personal history of other venous thrombosis and embolism: Secondary | ICD-10-CM | POA: Diagnosis not present

## 2020-07-02 DIAGNOSIS — G8250 Quadriplegia, unspecified: Secondary | ICD-10-CM | POA: Diagnosis not present

## 2020-07-02 DIAGNOSIS — G4733 Obstructive sleep apnea (adult) (pediatric): Secondary | ICD-10-CM | POA: Diagnosis not present

## 2020-07-02 DIAGNOSIS — I69320 Aphasia following cerebral infarction: Secondary | ICD-10-CM | POA: Diagnosis not present

## 2020-07-02 DIAGNOSIS — I69365 Other paralytic syndrome following cerebral infarction, bilateral: Secondary | ICD-10-CM | POA: Diagnosis not present

## 2020-07-02 DIAGNOSIS — E1165 Type 2 diabetes mellitus with hyperglycemia: Secondary | ICD-10-CM | POA: Diagnosis not present

## 2020-07-02 DIAGNOSIS — Z7401 Bed confinement status: Secondary | ICD-10-CM | POA: Diagnosis not present

## 2020-07-02 DIAGNOSIS — G40909 Epilepsy, unspecified, not intractable, without status epilepticus: Secondary | ICD-10-CM | POA: Diagnosis not present

## 2020-07-02 DIAGNOSIS — Z87440 Personal history of urinary (tract) infections: Secondary | ICD-10-CM | POA: Diagnosis not present

## 2020-07-02 DIAGNOSIS — R000 Tachycardia, unspecified: Secondary | ICD-10-CM | POA: Diagnosis not present

## 2020-07-02 DIAGNOSIS — D649 Anemia, unspecified: Secondary | ICD-10-CM | POA: Diagnosis not present

## 2020-07-06 DIAGNOSIS — G40909 Epilepsy, unspecified, not intractable, without status epilepticus: Secondary | ICD-10-CM | POA: Diagnosis not present

## 2020-07-06 DIAGNOSIS — Z87440 Personal history of urinary (tract) infections: Secondary | ICD-10-CM | POA: Diagnosis not present

## 2020-07-06 DIAGNOSIS — I69311 Memory deficit following cerebral infarction: Secondary | ICD-10-CM | POA: Diagnosis not present

## 2020-07-06 DIAGNOSIS — G4733 Obstructive sleep apnea (adult) (pediatric): Secondary | ICD-10-CM | POA: Diagnosis not present

## 2020-07-06 DIAGNOSIS — I69320 Aphasia following cerebral infarction: Secondary | ICD-10-CM | POA: Diagnosis not present

## 2020-07-06 DIAGNOSIS — D649 Anemia, unspecified: Secondary | ICD-10-CM | POA: Diagnosis not present

## 2020-07-06 DIAGNOSIS — I6931 Attention and concentration deficit following cerebral infarction: Secondary | ICD-10-CM | POA: Diagnosis not present

## 2020-07-06 DIAGNOSIS — I69365 Other paralytic syndrome following cerebral infarction, bilateral: Secondary | ICD-10-CM | POA: Diagnosis not present

## 2020-07-06 DIAGNOSIS — E1165 Type 2 diabetes mellitus with hyperglycemia: Secondary | ICD-10-CM | POA: Diagnosis not present

## 2020-07-06 DIAGNOSIS — Z86718 Personal history of other venous thrombosis and embolism: Secondary | ICD-10-CM | POA: Diagnosis not present

## 2020-07-06 DIAGNOSIS — I10 Essential (primary) hypertension: Secondary | ICD-10-CM | POA: Diagnosis not present

## 2020-07-06 DIAGNOSIS — G8250 Quadriplegia, unspecified: Secondary | ICD-10-CM | POA: Diagnosis not present

## 2020-07-06 DIAGNOSIS — Z7401 Bed confinement status: Secondary | ICD-10-CM | POA: Diagnosis not present

## 2020-07-06 DIAGNOSIS — R339 Retention of urine, unspecified: Secondary | ICD-10-CM | POA: Diagnosis not present

## 2020-07-06 DIAGNOSIS — R000 Tachycardia, unspecified: Secondary | ICD-10-CM | POA: Diagnosis not present

## 2020-07-07 DIAGNOSIS — T8189XA Other complications of procedures, not elsewhere classified, initial encounter: Secondary | ICD-10-CM | POA: Diagnosis not present

## 2020-07-07 DIAGNOSIS — Z86718 Personal history of other venous thrombosis and embolism: Secondary | ICD-10-CM | POA: Diagnosis not present

## 2020-07-07 DIAGNOSIS — Z7401 Bed confinement status: Secondary | ICD-10-CM | POA: Diagnosis not present

## 2020-07-07 DIAGNOSIS — I69311 Memory deficit following cerebral infarction: Secondary | ICD-10-CM | POA: Diagnosis not present

## 2020-07-07 DIAGNOSIS — I10 Essential (primary) hypertension: Secondary | ICD-10-CM | POA: Diagnosis not present

## 2020-07-07 DIAGNOSIS — G8250 Quadriplegia, unspecified: Secondary | ICD-10-CM | POA: Diagnosis not present

## 2020-07-07 DIAGNOSIS — I69320 Aphasia following cerebral infarction: Secondary | ICD-10-CM | POA: Diagnosis not present

## 2020-07-07 DIAGNOSIS — G4733 Obstructive sleep apnea (adult) (pediatric): Secondary | ICD-10-CM | POA: Diagnosis not present

## 2020-07-07 DIAGNOSIS — G40909 Epilepsy, unspecified, not intractable, without status epilepticus: Secondary | ICD-10-CM | POA: Diagnosis not present

## 2020-07-07 DIAGNOSIS — I69365 Other paralytic syndrome following cerebral infarction, bilateral: Secondary | ICD-10-CM | POA: Diagnosis not present

## 2020-07-07 DIAGNOSIS — R339 Retention of urine, unspecified: Secondary | ICD-10-CM | POA: Diagnosis not present

## 2020-07-07 DIAGNOSIS — I6931 Attention and concentration deficit following cerebral infarction: Secondary | ICD-10-CM | POA: Diagnosis not present

## 2020-07-07 DIAGNOSIS — E1165 Type 2 diabetes mellitus with hyperglycemia: Secondary | ICD-10-CM | POA: Diagnosis not present

## 2020-07-07 DIAGNOSIS — R000 Tachycardia, unspecified: Secondary | ICD-10-CM | POA: Diagnosis not present

## 2020-07-07 DIAGNOSIS — Z87440 Personal history of urinary (tract) infections: Secondary | ICD-10-CM | POA: Diagnosis not present

## 2020-07-07 DIAGNOSIS — L89154 Pressure ulcer of sacral region, stage 4: Secondary | ICD-10-CM | POA: Diagnosis not present

## 2020-07-07 DIAGNOSIS — D649 Anemia, unspecified: Secondary | ICD-10-CM | POA: Diagnosis not present

## 2020-07-09 DIAGNOSIS — I6389 Other cerebral infarction: Secondary | ICD-10-CM | POA: Diagnosis not present

## 2020-07-09 DIAGNOSIS — L8994 Pressure ulcer of unspecified site, stage 4: Secondary | ICD-10-CM | POA: Diagnosis not present

## 2020-07-09 DIAGNOSIS — G4733 Obstructive sleep apnea (adult) (pediatric): Secondary | ICD-10-CM | POA: Diagnosis not present

## 2020-07-13 DIAGNOSIS — G8250 Quadriplegia, unspecified: Secondary | ICD-10-CM | POA: Diagnosis not present

## 2020-07-13 DIAGNOSIS — D649 Anemia, unspecified: Secondary | ICD-10-CM | POA: Diagnosis not present

## 2020-07-13 DIAGNOSIS — I69320 Aphasia following cerebral infarction: Secondary | ICD-10-CM | POA: Diagnosis not present

## 2020-07-13 DIAGNOSIS — Z7401 Bed confinement status: Secondary | ICD-10-CM | POA: Diagnosis not present

## 2020-07-13 DIAGNOSIS — G40909 Epilepsy, unspecified, not intractable, without status epilepticus: Secondary | ICD-10-CM | POA: Diagnosis not present

## 2020-07-13 DIAGNOSIS — I6389 Other cerebral infarction: Secondary | ICD-10-CM | POA: Diagnosis not present

## 2020-07-13 DIAGNOSIS — L8994 Pressure ulcer of unspecified site, stage 4: Secondary | ICD-10-CM | POA: Diagnosis not present

## 2020-07-13 DIAGNOSIS — E1165 Type 2 diabetes mellitus with hyperglycemia: Secondary | ICD-10-CM | POA: Diagnosis not present

## 2020-07-13 DIAGNOSIS — G4733 Obstructive sleep apnea (adult) (pediatric): Secondary | ICD-10-CM | POA: Diagnosis not present

## 2020-07-13 DIAGNOSIS — R000 Tachycardia, unspecified: Secondary | ICD-10-CM | POA: Diagnosis not present

## 2020-07-13 DIAGNOSIS — I69365 Other paralytic syndrome following cerebral infarction, bilateral: Secondary | ICD-10-CM | POA: Diagnosis not present

## 2020-07-13 DIAGNOSIS — I6931 Attention and concentration deficit following cerebral infarction: Secondary | ICD-10-CM | POA: Diagnosis not present

## 2020-07-13 DIAGNOSIS — Z87440 Personal history of urinary (tract) infections: Secondary | ICD-10-CM | POA: Diagnosis not present

## 2020-07-13 DIAGNOSIS — Z86718 Personal history of other venous thrombosis and embolism: Secondary | ICD-10-CM | POA: Diagnosis not present

## 2020-07-13 DIAGNOSIS — R339 Retention of urine, unspecified: Secondary | ICD-10-CM | POA: Diagnosis not present

## 2020-07-13 DIAGNOSIS — I10 Essential (primary) hypertension: Secondary | ICD-10-CM | POA: Diagnosis not present

## 2020-07-13 DIAGNOSIS — I69311 Memory deficit following cerebral infarction: Secondary | ICD-10-CM | POA: Diagnosis not present

## 2020-07-14 DIAGNOSIS — Z86718 Personal history of other venous thrombosis and embolism: Secondary | ICD-10-CM | POA: Diagnosis not present

## 2020-07-14 DIAGNOSIS — E1165 Type 2 diabetes mellitus with hyperglycemia: Secondary | ICD-10-CM | POA: Diagnosis not present

## 2020-07-14 DIAGNOSIS — Z7401 Bed confinement status: Secondary | ICD-10-CM | POA: Diagnosis not present

## 2020-07-14 DIAGNOSIS — I69320 Aphasia following cerebral infarction: Secondary | ICD-10-CM | POA: Diagnosis not present

## 2020-07-14 DIAGNOSIS — I69365 Other paralytic syndrome following cerebral infarction, bilateral: Secondary | ICD-10-CM | POA: Diagnosis not present

## 2020-07-14 DIAGNOSIS — G8250 Quadriplegia, unspecified: Secondary | ICD-10-CM | POA: Diagnosis not present

## 2020-07-14 DIAGNOSIS — I6931 Attention and concentration deficit following cerebral infarction: Secondary | ICD-10-CM | POA: Diagnosis not present

## 2020-07-14 DIAGNOSIS — I10 Essential (primary) hypertension: Secondary | ICD-10-CM | POA: Diagnosis not present

## 2020-07-14 DIAGNOSIS — I69311 Memory deficit following cerebral infarction: Secondary | ICD-10-CM | POA: Diagnosis not present

## 2020-07-14 DIAGNOSIS — R000 Tachycardia, unspecified: Secondary | ICD-10-CM | POA: Diagnosis not present

## 2020-07-14 DIAGNOSIS — D649 Anemia, unspecified: Secondary | ICD-10-CM | POA: Diagnosis not present

## 2020-07-14 DIAGNOSIS — Z87440 Personal history of urinary (tract) infections: Secondary | ICD-10-CM | POA: Diagnosis not present

## 2020-07-14 DIAGNOSIS — R339 Retention of urine, unspecified: Secondary | ICD-10-CM | POA: Diagnosis not present

## 2020-07-14 DIAGNOSIS — G40909 Epilepsy, unspecified, not intractable, without status epilepticus: Secondary | ICD-10-CM | POA: Diagnosis not present

## 2020-07-14 DIAGNOSIS — G4733 Obstructive sleep apnea (adult) (pediatric): Secondary | ICD-10-CM | POA: Diagnosis not present

## 2020-07-16 DIAGNOSIS — D649 Anemia, unspecified: Secondary | ICD-10-CM | POA: Diagnosis not present

## 2020-07-16 DIAGNOSIS — Z86718 Personal history of other venous thrombosis and embolism: Secondary | ICD-10-CM | POA: Diagnosis not present

## 2020-07-16 DIAGNOSIS — R000 Tachycardia, unspecified: Secondary | ICD-10-CM | POA: Diagnosis not present

## 2020-07-16 DIAGNOSIS — I69311 Memory deficit following cerebral infarction: Secondary | ICD-10-CM | POA: Diagnosis not present

## 2020-07-16 DIAGNOSIS — G4733 Obstructive sleep apnea (adult) (pediatric): Secondary | ICD-10-CM | POA: Diagnosis not present

## 2020-07-16 DIAGNOSIS — G40909 Epilepsy, unspecified, not intractable, without status epilepticus: Secondary | ICD-10-CM | POA: Diagnosis not present

## 2020-07-16 DIAGNOSIS — Z87440 Personal history of urinary (tract) infections: Secondary | ICD-10-CM | POA: Diagnosis not present

## 2020-07-16 DIAGNOSIS — E1165 Type 2 diabetes mellitus with hyperglycemia: Secondary | ICD-10-CM | POA: Diagnosis not present

## 2020-07-16 DIAGNOSIS — I69320 Aphasia following cerebral infarction: Secondary | ICD-10-CM | POA: Diagnosis not present

## 2020-07-16 DIAGNOSIS — G8250 Quadriplegia, unspecified: Secondary | ICD-10-CM | POA: Diagnosis not present

## 2020-07-16 DIAGNOSIS — R339 Retention of urine, unspecified: Secondary | ICD-10-CM | POA: Diagnosis not present

## 2020-07-16 DIAGNOSIS — I69365 Other paralytic syndrome following cerebral infarction, bilateral: Secondary | ICD-10-CM | POA: Diagnosis not present

## 2020-07-16 DIAGNOSIS — I10 Essential (primary) hypertension: Secondary | ICD-10-CM | POA: Diagnosis not present

## 2020-07-16 DIAGNOSIS — Z7401 Bed confinement status: Secondary | ICD-10-CM | POA: Diagnosis not present

## 2020-07-16 DIAGNOSIS — I6931 Attention and concentration deficit following cerebral infarction: Secondary | ICD-10-CM | POA: Diagnosis not present

## 2020-07-17 DIAGNOSIS — D649 Anemia, unspecified: Secondary | ICD-10-CM | POA: Diagnosis not present

## 2020-07-17 DIAGNOSIS — I69311 Memory deficit following cerebral infarction: Secondary | ICD-10-CM | POA: Diagnosis not present

## 2020-07-17 DIAGNOSIS — R339 Retention of urine, unspecified: Secondary | ICD-10-CM | POA: Diagnosis not present

## 2020-07-17 DIAGNOSIS — Z7401 Bed confinement status: Secondary | ICD-10-CM | POA: Diagnosis not present

## 2020-07-17 DIAGNOSIS — I69365 Other paralytic syndrome following cerebral infarction, bilateral: Secondary | ICD-10-CM | POA: Diagnosis not present

## 2020-07-17 DIAGNOSIS — Z87440 Personal history of urinary (tract) infections: Secondary | ICD-10-CM | POA: Diagnosis not present

## 2020-07-17 DIAGNOSIS — I69320 Aphasia following cerebral infarction: Secondary | ICD-10-CM | POA: Diagnosis not present

## 2020-07-17 DIAGNOSIS — G40909 Epilepsy, unspecified, not intractable, without status epilepticus: Secondary | ICD-10-CM | POA: Diagnosis not present

## 2020-07-17 DIAGNOSIS — G4733 Obstructive sleep apnea (adult) (pediatric): Secondary | ICD-10-CM | POA: Diagnosis not present

## 2020-07-17 DIAGNOSIS — E1165 Type 2 diabetes mellitus with hyperglycemia: Secondary | ICD-10-CM | POA: Diagnosis not present

## 2020-07-17 DIAGNOSIS — G8250 Quadriplegia, unspecified: Secondary | ICD-10-CM | POA: Diagnosis not present

## 2020-07-17 DIAGNOSIS — I10 Essential (primary) hypertension: Secondary | ICD-10-CM | POA: Diagnosis not present

## 2020-07-17 DIAGNOSIS — R000 Tachycardia, unspecified: Secondary | ICD-10-CM | POA: Diagnosis not present

## 2020-07-17 DIAGNOSIS — Z86718 Personal history of other venous thrombosis and embolism: Secondary | ICD-10-CM | POA: Diagnosis not present

## 2020-07-17 DIAGNOSIS — I6931 Attention and concentration deficit following cerebral infarction: Secondary | ICD-10-CM | POA: Diagnosis not present

## 2020-07-20 DIAGNOSIS — I69320 Aphasia following cerebral infarction: Secondary | ICD-10-CM | POA: Diagnosis not present

## 2020-07-20 DIAGNOSIS — Z7401 Bed confinement status: Secondary | ICD-10-CM | POA: Diagnosis not present

## 2020-07-20 DIAGNOSIS — Z86718 Personal history of other venous thrombosis and embolism: Secondary | ICD-10-CM | POA: Diagnosis not present

## 2020-07-20 DIAGNOSIS — I6931 Attention and concentration deficit following cerebral infarction: Secondary | ICD-10-CM | POA: Diagnosis not present

## 2020-07-20 DIAGNOSIS — I69311 Memory deficit following cerebral infarction: Secondary | ICD-10-CM | POA: Diagnosis not present

## 2020-07-20 DIAGNOSIS — Z87440 Personal history of urinary (tract) infections: Secondary | ICD-10-CM | POA: Diagnosis not present

## 2020-07-20 DIAGNOSIS — G8250 Quadriplegia, unspecified: Secondary | ICD-10-CM | POA: Diagnosis not present

## 2020-07-20 DIAGNOSIS — G40909 Epilepsy, unspecified, not intractable, without status epilepticus: Secondary | ICD-10-CM | POA: Diagnosis not present

## 2020-07-20 DIAGNOSIS — E1165 Type 2 diabetes mellitus with hyperglycemia: Secondary | ICD-10-CM | POA: Diagnosis not present

## 2020-07-20 DIAGNOSIS — R000 Tachycardia, unspecified: Secondary | ICD-10-CM | POA: Diagnosis not present

## 2020-07-20 DIAGNOSIS — I10 Essential (primary) hypertension: Secondary | ICD-10-CM | POA: Diagnosis not present

## 2020-07-20 DIAGNOSIS — R339 Retention of urine, unspecified: Secondary | ICD-10-CM | POA: Diagnosis not present

## 2020-07-20 DIAGNOSIS — I69365 Other paralytic syndrome following cerebral infarction, bilateral: Secondary | ICD-10-CM | POA: Diagnosis not present

## 2020-07-20 DIAGNOSIS — D649 Anemia, unspecified: Secondary | ICD-10-CM | POA: Diagnosis not present

## 2020-07-20 DIAGNOSIS — G4733 Obstructive sleep apnea (adult) (pediatric): Secondary | ICD-10-CM | POA: Diagnosis not present

## 2020-07-22 DIAGNOSIS — I69320 Aphasia following cerebral infarction: Secondary | ICD-10-CM | POA: Diagnosis not present

## 2020-07-22 DIAGNOSIS — G4733 Obstructive sleep apnea (adult) (pediatric): Secondary | ICD-10-CM | POA: Diagnosis not present

## 2020-07-22 DIAGNOSIS — I69311 Memory deficit following cerebral infarction: Secondary | ICD-10-CM | POA: Diagnosis not present

## 2020-07-22 DIAGNOSIS — E1165 Type 2 diabetes mellitus with hyperglycemia: Secondary | ICD-10-CM | POA: Diagnosis not present

## 2020-07-22 DIAGNOSIS — D649 Anemia, unspecified: Secondary | ICD-10-CM | POA: Diagnosis not present

## 2020-07-22 DIAGNOSIS — R000 Tachycardia, unspecified: Secondary | ICD-10-CM | POA: Diagnosis not present

## 2020-07-22 DIAGNOSIS — G8250 Quadriplegia, unspecified: Secondary | ICD-10-CM | POA: Diagnosis not present

## 2020-07-22 DIAGNOSIS — I69365 Other paralytic syndrome following cerebral infarction, bilateral: Secondary | ICD-10-CM | POA: Diagnosis not present

## 2020-07-22 DIAGNOSIS — G40909 Epilepsy, unspecified, not intractable, without status epilepticus: Secondary | ICD-10-CM | POA: Diagnosis not present

## 2020-07-22 DIAGNOSIS — I6931 Attention and concentration deficit following cerebral infarction: Secondary | ICD-10-CM | POA: Diagnosis not present

## 2020-07-22 DIAGNOSIS — Z87440 Personal history of urinary (tract) infections: Secondary | ICD-10-CM | POA: Diagnosis not present

## 2020-07-22 DIAGNOSIS — Z7401 Bed confinement status: Secondary | ICD-10-CM | POA: Diagnosis not present

## 2020-07-22 DIAGNOSIS — R339 Retention of urine, unspecified: Secondary | ICD-10-CM | POA: Diagnosis not present

## 2020-07-22 DIAGNOSIS — I10 Essential (primary) hypertension: Secondary | ICD-10-CM | POA: Diagnosis not present

## 2020-07-22 DIAGNOSIS — Z86718 Personal history of other venous thrombosis and embolism: Secondary | ICD-10-CM | POA: Diagnosis not present

## 2020-07-23 DIAGNOSIS — Z86718 Personal history of other venous thrombosis and embolism: Secondary | ICD-10-CM | POA: Diagnosis not present

## 2020-07-23 DIAGNOSIS — G4733 Obstructive sleep apnea (adult) (pediatric): Secondary | ICD-10-CM | POA: Diagnosis not present

## 2020-07-23 DIAGNOSIS — R000 Tachycardia, unspecified: Secondary | ICD-10-CM | POA: Diagnosis not present

## 2020-07-23 DIAGNOSIS — I69311 Memory deficit following cerebral infarction: Secondary | ICD-10-CM | POA: Diagnosis not present

## 2020-07-23 DIAGNOSIS — I69365 Other paralytic syndrome following cerebral infarction, bilateral: Secondary | ICD-10-CM | POA: Diagnosis not present

## 2020-07-23 DIAGNOSIS — Z7401 Bed confinement status: Secondary | ICD-10-CM | POA: Diagnosis not present

## 2020-07-23 DIAGNOSIS — G40909 Epilepsy, unspecified, not intractable, without status epilepticus: Secondary | ICD-10-CM | POA: Diagnosis not present

## 2020-07-23 DIAGNOSIS — I6931 Attention and concentration deficit following cerebral infarction: Secondary | ICD-10-CM | POA: Diagnosis not present

## 2020-07-23 DIAGNOSIS — I10 Essential (primary) hypertension: Secondary | ICD-10-CM | POA: Diagnosis not present

## 2020-07-23 DIAGNOSIS — G8250 Quadriplegia, unspecified: Secondary | ICD-10-CM | POA: Diagnosis not present

## 2020-07-23 DIAGNOSIS — Z87440 Personal history of urinary (tract) infections: Secondary | ICD-10-CM | POA: Diagnosis not present

## 2020-07-23 DIAGNOSIS — E1165 Type 2 diabetes mellitus with hyperglycemia: Secondary | ICD-10-CM | POA: Diagnosis not present

## 2020-07-23 DIAGNOSIS — R339 Retention of urine, unspecified: Secondary | ICD-10-CM | POA: Diagnosis not present

## 2020-07-23 DIAGNOSIS — I69320 Aphasia following cerebral infarction: Secondary | ICD-10-CM | POA: Diagnosis not present

## 2020-07-23 DIAGNOSIS — D649 Anemia, unspecified: Secondary | ICD-10-CM | POA: Diagnosis not present

## 2020-07-28 DIAGNOSIS — G8250 Quadriplegia, unspecified: Secondary | ICD-10-CM | POA: Diagnosis not present

## 2020-07-28 DIAGNOSIS — R000 Tachycardia, unspecified: Secondary | ICD-10-CM | POA: Diagnosis not present

## 2020-07-28 DIAGNOSIS — G4733 Obstructive sleep apnea (adult) (pediatric): Secondary | ICD-10-CM | POA: Diagnosis not present

## 2020-07-28 DIAGNOSIS — I69320 Aphasia following cerebral infarction: Secondary | ICD-10-CM | POA: Diagnosis not present

## 2020-07-28 DIAGNOSIS — E1165 Type 2 diabetes mellitus with hyperglycemia: Secondary | ICD-10-CM | POA: Diagnosis not present

## 2020-07-28 DIAGNOSIS — G40909 Epilepsy, unspecified, not intractable, without status epilepticus: Secondary | ICD-10-CM | POA: Diagnosis not present

## 2020-07-28 DIAGNOSIS — I69365 Other paralytic syndrome following cerebral infarction, bilateral: Secondary | ICD-10-CM | POA: Diagnosis not present

## 2020-07-28 DIAGNOSIS — D649 Anemia, unspecified: Secondary | ICD-10-CM | POA: Diagnosis not present

## 2020-07-28 DIAGNOSIS — I10 Essential (primary) hypertension: Secondary | ICD-10-CM | POA: Diagnosis not present

## 2020-07-28 DIAGNOSIS — I69311 Memory deficit following cerebral infarction: Secondary | ICD-10-CM | POA: Diagnosis not present

## 2020-07-28 DIAGNOSIS — I6931 Attention and concentration deficit following cerebral infarction: Secondary | ICD-10-CM | POA: Diagnosis not present

## 2020-07-28 DIAGNOSIS — Z7401 Bed confinement status: Secondary | ICD-10-CM | POA: Diagnosis not present

## 2020-07-28 DIAGNOSIS — Z87440 Personal history of urinary (tract) infections: Secondary | ICD-10-CM | POA: Diagnosis not present

## 2020-07-28 DIAGNOSIS — Z86718 Personal history of other venous thrombosis and embolism: Secondary | ICD-10-CM | POA: Diagnosis not present

## 2020-07-28 DIAGNOSIS — R339 Retention of urine, unspecified: Secondary | ICD-10-CM | POA: Diagnosis not present

## 2020-07-29 DIAGNOSIS — E1165 Type 2 diabetes mellitus with hyperglycemia: Secondary | ICD-10-CM | POA: Diagnosis not present

## 2020-07-29 DIAGNOSIS — I6931 Attention and concentration deficit following cerebral infarction: Secondary | ICD-10-CM | POA: Diagnosis not present

## 2020-07-29 DIAGNOSIS — R000 Tachycardia, unspecified: Secondary | ICD-10-CM | POA: Diagnosis not present

## 2020-07-29 DIAGNOSIS — G4733 Obstructive sleep apnea (adult) (pediatric): Secondary | ICD-10-CM | POA: Diagnosis not present

## 2020-07-29 DIAGNOSIS — Z7401 Bed confinement status: Secondary | ICD-10-CM | POA: Diagnosis not present

## 2020-07-29 DIAGNOSIS — R339 Retention of urine, unspecified: Secondary | ICD-10-CM | POA: Diagnosis not present

## 2020-07-29 DIAGNOSIS — I6389 Other cerebral infarction: Secondary | ICD-10-CM | POA: Diagnosis not present

## 2020-07-29 DIAGNOSIS — I10 Essential (primary) hypertension: Secondary | ICD-10-CM | POA: Diagnosis not present

## 2020-07-29 DIAGNOSIS — D649 Anemia, unspecified: Secondary | ICD-10-CM | POA: Diagnosis not present

## 2020-07-29 DIAGNOSIS — L8994 Pressure ulcer of unspecified site, stage 4: Secondary | ICD-10-CM | POA: Diagnosis not present

## 2020-07-29 DIAGNOSIS — I69320 Aphasia following cerebral infarction: Secondary | ICD-10-CM | POA: Diagnosis not present

## 2020-07-29 DIAGNOSIS — G40909 Epilepsy, unspecified, not intractable, without status epilepticus: Secondary | ICD-10-CM | POA: Diagnosis not present

## 2020-07-29 DIAGNOSIS — I69311 Memory deficit following cerebral infarction: Secondary | ICD-10-CM | POA: Diagnosis not present

## 2020-07-29 DIAGNOSIS — Z87440 Personal history of urinary (tract) infections: Secondary | ICD-10-CM | POA: Diagnosis not present

## 2020-07-29 DIAGNOSIS — G8250 Quadriplegia, unspecified: Secondary | ICD-10-CM | POA: Diagnosis not present

## 2020-07-29 DIAGNOSIS — I69365 Other paralytic syndrome following cerebral infarction, bilateral: Secondary | ICD-10-CM | POA: Diagnosis not present

## 2020-07-29 DIAGNOSIS — Z86718 Personal history of other venous thrombosis and embolism: Secondary | ICD-10-CM | POA: Diagnosis not present

## 2020-07-30 DIAGNOSIS — I69365 Other paralytic syndrome following cerebral infarction, bilateral: Secondary | ICD-10-CM | POA: Diagnosis not present

## 2020-07-30 DIAGNOSIS — D649 Anemia, unspecified: Secondary | ICD-10-CM | POA: Diagnosis not present

## 2020-07-30 DIAGNOSIS — R000 Tachycardia, unspecified: Secondary | ICD-10-CM | POA: Diagnosis not present

## 2020-07-30 DIAGNOSIS — E1165 Type 2 diabetes mellitus with hyperglycemia: Secondary | ICD-10-CM | POA: Diagnosis not present

## 2020-07-30 DIAGNOSIS — R339 Retention of urine, unspecified: Secondary | ICD-10-CM | POA: Diagnosis not present

## 2020-07-30 DIAGNOSIS — I69320 Aphasia following cerebral infarction: Secondary | ICD-10-CM | POA: Diagnosis not present

## 2020-07-30 DIAGNOSIS — Z87440 Personal history of urinary (tract) infections: Secondary | ICD-10-CM | POA: Diagnosis not present

## 2020-07-30 DIAGNOSIS — Z86718 Personal history of other venous thrombosis and embolism: Secondary | ICD-10-CM | POA: Diagnosis not present

## 2020-07-30 DIAGNOSIS — I10 Essential (primary) hypertension: Secondary | ICD-10-CM | POA: Diagnosis not present

## 2020-07-30 DIAGNOSIS — Z7401 Bed confinement status: Secondary | ICD-10-CM | POA: Diagnosis not present

## 2020-07-30 DIAGNOSIS — G40909 Epilepsy, unspecified, not intractable, without status epilepticus: Secondary | ICD-10-CM | POA: Diagnosis not present

## 2020-07-30 DIAGNOSIS — I69311 Memory deficit following cerebral infarction: Secondary | ICD-10-CM | POA: Diagnosis not present

## 2020-07-30 DIAGNOSIS — I6931 Attention and concentration deficit following cerebral infarction: Secondary | ICD-10-CM | POA: Diagnosis not present

## 2020-07-30 DIAGNOSIS — G8250 Quadriplegia, unspecified: Secondary | ICD-10-CM | POA: Diagnosis not present

## 2020-07-30 DIAGNOSIS — G4733 Obstructive sleep apnea (adult) (pediatric): Secondary | ICD-10-CM | POA: Diagnosis not present

## 2020-07-31 DIAGNOSIS — Z7401 Bed confinement status: Secondary | ICD-10-CM | POA: Diagnosis not present

## 2020-07-31 DIAGNOSIS — E1165 Type 2 diabetes mellitus with hyperglycemia: Secondary | ICD-10-CM | POA: Diagnosis not present

## 2020-07-31 DIAGNOSIS — Z87440 Personal history of urinary (tract) infections: Secondary | ICD-10-CM | POA: Diagnosis not present

## 2020-07-31 DIAGNOSIS — G4733 Obstructive sleep apnea (adult) (pediatric): Secondary | ICD-10-CM | POA: Diagnosis not present

## 2020-07-31 DIAGNOSIS — I69365 Other paralytic syndrome following cerebral infarction, bilateral: Secondary | ICD-10-CM | POA: Diagnosis not present

## 2020-07-31 DIAGNOSIS — G8250 Quadriplegia, unspecified: Secondary | ICD-10-CM | POA: Diagnosis not present

## 2020-07-31 DIAGNOSIS — G40909 Epilepsy, unspecified, not intractable, without status epilepticus: Secondary | ICD-10-CM | POA: Diagnosis not present

## 2020-07-31 DIAGNOSIS — R339 Retention of urine, unspecified: Secondary | ICD-10-CM | POA: Diagnosis not present

## 2020-07-31 DIAGNOSIS — I6931 Attention and concentration deficit following cerebral infarction: Secondary | ICD-10-CM | POA: Diagnosis not present

## 2020-07-31 DIAGNOSIS — I69311 Memory deficit following cerebral infarction: Secondary | ICD-10-CM | POA: Diagnosis not present

## 2020-07-31 DIAGNOSIS — D649 Anemia, unspecified: Secondary | ICD-10-CM | POA: Diagnosis not present

## 2020-07-31 DIAGNOSIS — R000 Tachycardia, unspecified: Secondary | ICD-10-CM | POA: Diagnosis not present

## 2020-07-31 DIAGNOSIS — I69320 Aphasia following cerebral infarction: Secondary | ICD-10-CM | POA: Diagnosis not present

## 2020-07-31 DIAGNOSIS — Z86718 Personal history of other venous thrombosis and embolism: Secondary | ICD-10-CM | POA: Diagnosis not present

## 2020-07-31 DIAGNOSIS — I10 Essential (primary) hypertension: Secondary | ICD-10-CM | POA: Diagnosis not present

## 2020-08-01 DIAGNOSIS — R262 Difficulty in walking, not elsewhere classified: Secondary | ICD-10-CM | POA: Diagnosis not present

## 2020-08-01 DIAGNOSIS — I6389 Other cerebral infarction: Secondary | ICD-10-CM | POA: Diagnosis not present

## 2020-08-01 DIAGNOSIS — R2689 Other abnormalities of gait and mobility: Secondary | ICD-10-CM | POA: Diagnosis not present

## 2020-08-03 DIAGNOSIS — Z87440 Personal history of urinary (tract) infections: Secondary | ICD-10-CM | POA: Diagnosis not present

## 2020-08-03 DIAGNOSIS — Z7401 Bed confinement status: Secondary | ICD-10-CM | POA: Diagnosis not present

## 2020-08-03 DIAGNOSIS — I69320 Aphasia following cerebral infarction: Secondary | ICD-10-CM | POA: Diagnosis not present

## 2020-08-03 DIAGNOSIS — Z86718 Personal history of other venous thrombosis and embolism: Secondary | ICD-10-CM | POA: Diagnosis not present

## 2020-08-03 DIAGNOSIS — G8250 Quadriplegia, unspecified: Secondary | ICD-10-CM | POA: Diagnosis not present

## 2020-08-03 DIAGNOSIS — R000 Tachycardia, unspecified: Secondary | ICD-10-CM | POA: Diagnosis not present

## 2020-08-03 DIAGNOSIS — D649 Anemia, unspecified: Secondary | ICD-10-CM | POA: Diagnosis not present

## 2020-08-03 DIAGNOSIS — I69311 Memory deficit following cerebral infarction: Secondary | ICD-10-CM | POA: Diagnosis not present

## 2020-08-03 DIAGNOSIS — G4733 Obstructive sleep apnea (adult) (pediatric): Secondary | ICD-10-CM | POA: Diagnosis not present

## 2020-08-03 DIAGNOSIS — R339 Retention of urine, unspecified: Secondary | ICD-10-CM | POA: Diagnosis not present

## 2020-08-03 DIAGNOSIS — I10 Essential (primary) hypertension: Secondary | ICD-10-CM | POA: Diagnosis not present

## 2020-08-03 DIAGNOSIS — I69365 Other paralytic syndrome following cerebral infarction, bilateral: Secondary | ICD-10-CM | POA: Diagnosis not present

## 2020-08-03 DIAGNOSIS — E1165 Type 2 diabetes mellitus with hyperglycemia: Secondary | ICD-10-CM | POA: Diagnosis not present

## 2020-08-03 DIAGNOSIS — I6931 Attention and concentration deficit following cerebral infarction: Secondary | ICD-10-CM | POA: Diagnosis not present

## 2020-08-03 DIAGNOSIS — G40909 Epilepsy, unspecified, not intractable, without status epilepticus: Secondary | ICD-10-CM | POA: Diagnosis not present

## 2020-08-05 DIAGNOSIS — D649 Anemia, unspecified: Secondary | ICD-10-CM | POA: Diagnosis not present

## 2020-08-05 DIAGNOSIS — Z87440 Personal history of urinary (tract) infections: Secondary | ICD-10-CM | POA: Diagnosis not present

## 2020-08-05 DIAGNOSIS — E1165 Type 2 diabetes mellitus with hyperglycemia: Secondary | ICD-10-CM | POA: Diagnosis not present

## 2020-08-05 DIAGNOSIS — I69365 Other paralytic syndrome following cerebral infarction, bilateral: Secondary | ICD-10-CM | POA: Diagnosis not present

## 2020-08-05 DIAGNOSIS — I6931 Attention and concentration deficit following cerebral infarction: Secondary | ICD-10-CM | POA: Diagnosis not present

## 2020-08-05 DIAGNOSIS — G4733 Obstructive sleep apnea (adult) (pediatric): Secondary | ICD-10-CM | POA: Diagnosis not present

## 2020-08-05 DIAGNOSIS — Z7401 Bed confinement status: Secondary | ICD-10-CM | POA: Diagnosis not present

## 2020-08-05 DIAGNOSIS — R000 Tachycardia, unspecified: Secondary | ICD-10-CM | POA: Diagnosis not present

## 2020-08-05 DIAGNOSIS — G8250 Quadriplegia, unspecified: Secondary | ICD-10-CM | POA: Diagnosis not present

## 2020-08-05 DIAGNOSIS — G40909 Epilepsy, unspecified, not intractable, without status epilepticus: Secondary | ICD-10-CM | POA: Diagnosis not present

## 2020-08-05 DIAGNOSIS — I69320 Aphasia following cerebral infarction: Secondary | ICD-10-CM | POA: Diagnosis not present

## 2020-08-05 DIAGNOSIS — Z86718 Personal history of other venous thrombosis and embolism: Secondary | ICD-10-CM | POA: Diagnosis not present

## 2020-08-05 DIAGNOSIS — I10 Essential (primary) hypertension: Secondary | ICD-10-CM | POA: Diagnosis not present

## 2020-08-05 DIAGNOSIS — I69311 Memory deficit following cerebral infarction: Secondary | ICD-10-CM | POA: Diagnosis not present

## 2020-08-05 DIAGNOSIS — R339 Retention of urine, unspecified: Secondary | ICD-10-CM | POA: Diagnosis not present

## 2020-08-06 DIAGNOSIS — I69320 Aphasia following cerebral infarction: Secondary | ICD-10-CM | POA: Diagnosis not present

## 2020-08-06 DIAGNOSIS — Z87440 Personal history of urinary (tract) infections: Secondary | ICD-10-CM | POA: Diagnosis not present

## 2020-08-06 DIAGNOSIS — L89154 Pressure ulcer of sacral region, stage 4: Secondary | ICD-10-CM | POA: Diagnosis not present

## 2020-08-06 DIAGNOSIS — G40909 Epilepsy, unspecified, not intractable, without status epilepticus: Secondary | ICD-10-CM | POA: Diagnosis not present

## 2020-08-06 DIAGNOSIS — D649 Anemia, unspecified: Secondary | ICD-10-CM | POA: Diagnosis not present

## 2020-08-06 DIAGNOSIS — Z86718 Personal history of other venous thrombosis and embolism: Secondary | ICD-10-CM | POA: Diagnosis not present

## 2020-08-06 DIAGNOSIS — Z7401 Bed confinement status: Secondary | ICD-10-CM | POA: Diagnosis not present

## 2020-08-06 DIAGNOSIS — T8189XA Other complications of procedures, not elsewhere classified, initial encounter: Secondary | ICD-10-CM | POA: Diagnosis not present

## 2020-08-06 DIAGNOSIS — I69365 Other paralytic syndrome following cerebral infarction, bilateral: Secondary | ICD-10-CM | POA: Diagnosis not present

## 2020-08-06 DIAGNOSIS — I69311 Memory deficit following cerebral infarction: Secondary | ICD-10-CM | POA: Diagnosis not present

## 2020-08-06 DIAGNOSIS — G4733 Obstructive sleep apnea (adult) (pediatric): Secondary | ICD-10-CM | POA: Diagnosis not present

## 2020-08-06 DIAGNOSIS — R339 Retention of urine, unspecified: Secondary | ICD-10-CM | POA: Diagnosis not present

## 2020-08-06 DIAGNOSIS — I6931 Attention and concentration deficit following cerebral infarction: Secondary | ICD-10-CM | POA: Diagnosis not present

## 2020-08-06 DIAGNOSIS — G8250 Quadriplegia, unspecified: Secondary | ICD-10-CM | POA: Diagnosis not present

## 2020-08-06 DIAGNOSIS — I10 Essential (primary) hypertension: Secondary | ICD-10-CM | POA: Diagnosis not present

## 2020-08-06 DIAGNOSIS — E1165 Type 2 diabetes mellitus with hyperglycemia: Secondary | ICD-10-CM | POA: Diagnosis not present

## 2020-08-06 DIAGNOSIS — R000 Tachycardia, unspecified: Secondary | ICD-10-CM | POA: Diagnosis not present

## 2020-08-07 DIAGNOSIS — E1165 Type 2 diabetes mellitus with hyperglycemia: Secondary | ICD-10-CM | POA: Diagnosis not present

## 2020-08-07 DIAGNOSIS — R000 Tachycardia, unspecified: Secondary | ICD-10-CM | POA: Diagnosis not present

## 2020-08-07 DIAGNOSIS — Z86718 Personal history of other venous thrombosis and embolism: Secondary | ICD-10-CM | POA: Diagnosis not present

## 2020-08-07 DIAGNOSIS — I6931 Attention and concentration deficit following cerebral infarction: Secondary | ICD-10-CM | POA: Diagnosis not present

## 2020-08-07 DIAGNOSIS — R339 Retention of urine, unspecified: Secondary | ICD-10-CM | POA: Diagnosis not present

## 2020-08-07 DIAGNOSIS — G8250 Quadriplegia, unspecified: Secondary | ICD-10-CM | POA: Diagnosis not present

## 2020-08-07 DIAGNOSIS — I10 Essential (primary) hypertension: Secondary | ICD-10-CM | POA: Diagnosis not present

## 2020-08-07 DIAGNOSIS — G4733 Obstructive sleep apnea (adult) (pediatric): Secondary | ICD-10-CM | POA: Diagnosis not present

## 2020-08-07 DIAGNOSIS — I69365 Other paralytic syndrome following cerebral infarction, bilateral: Secondary | ICD-10-CM | POA: Diagnosis not present

## 2020-08-07 DIAGNOSIS — Z87440 Personal history of urinary (tract) infections: Secondary | ICD-10-CM | POA: Diagnosis not present

## 2020-08-07 DIAGNOSIS — I69311 Memory deficit following cerebral infarction: Secondary | ICD-10-CM | POA: Diagnosis not present

## 2020-08-07 DIAGNOSIS — Z7401 Bed confinement status: Secondary | ICD-10-CM | POA: Diagnosis not present

## 2020-08-07 DIAGNOSIS — D649 Anemia, unspecified: Secondary | ICD-10-CM | POA: Diagnosis not present

## 2020-08-07 DIAGNOSIS — G40909 Epilepsy, unspecified, not intractable, without status epilepticus: Secondary | ICD-10-CM | POA: Diagnosis not present

## 2020-08-07 DIAGNOSIS — I69320 Aphasia following cerebral infarction: Secondary | ICD-10-CM | POA: Diagnosis not present

## 2020-08-08 DIAGNOSIS — I6389 Other cerebral infarction: Secondary | ICD-10-CM | POA: Diagnosis not present

## 2020-08-08 DIAGNOSIS — L8994 Pressure ulcer of unspecified site, stage 4: Secondary | ICD-10-CM | POA: Diagnosis not present

## 2020-08-08 DIAGNOSIS — G4733 Obstructive sleep apnea (adult) (pediatric): Secondary | ICD-10-CM | POA: Diagnosis not present

## 2020-08-10 DIAGNOSIS — Z87440 Personal history of urinary (tract) infections: Secondary | ICD-10-CM | POA: Diagnosis not present

## 2020-08-10 DIAGNOSIS — I69311 Memory deficit following cerebral infarction: Secondary | ICD-10-CM | POA: Diagnosis not present

## 2020-08-10 DIAGNOSIS — I69365 Other paralytic syndrome following cerebral infarction, bilateral: Secondary | ICD-10-CM | POA: Diagnosis not present

## 2020-08-10 DIAGNOSIS — G8250 Quadriplegia, unspecified: Secondary | ICD-10-CM | POA: Diagnosis not present

## 2020-08-10 DIAGNOSIS — G4733 Obstructive sleep apnea (adult) (pediatric): Secondary | ICD-10-CM | POA: Diagnosis not present

## 2020-08-10 DIAGNOSIS — R000 Tachycardia, unspecified: Secondary | ICD-10-CM | POA: Diagnosis not present

## 2020-08-10 DIAGNOSIS — G40909 Epilepsy, unspecified, not intractable, without status epilepticus: Secondary | ICD-10-CM | POA: Diagnosis not present

## 2020-08-10 DIAGNOSIS — R339 Retention of urine, unspecified: Secondary | ICD-10-CM | POA: Diagnosis not present

## 2020-08-10 DIAGNOSIS — I6931 Attention and concentration deficit following cerebral infarction: Secondary | ICD-10-CM | POA: Diagnosis not present

## 2020-08-10 DIAGNOSIS — I10 Essential (primary) hypertension: Secondary | ICD-10-CM | POA: Diagnosis not present

## 2020-08-10 DIAGNOSIS — Z86718 Personal history of other venous thrombosis and embolism: Secondary | ICD-10-CM | POA: Diagnosis not present

## 2020-08-10 DIAGNOSIS — Z7401 Bed confinement status: Secondary | ICD-10-CM | POA: Diagnosis not present

## 2020-08-10 DIAGNOSIS — I69320 Aphasia following cerebral infarction: Secondary | ICD-10-CM | POA: Diagnosis not present

## 2020-08-10 DIAGNOSIS — D649 Anemia, unspecified: Secondary | ICD-10-CM | POA: Diagnosis not present

## 2020-08-10 DIAGNOSIS — E1165 Type 2 diabetes mellitus with hyperglycemia: Secondary | ICD-10-CM | POA: Diagnosis not present

## 2020-08-11 DIAGNOSIS — I69311 Memory deficit following cerebral infarction: Secondary | ICD-10-CM | POA: Diagnosis not present

## 2020-08-11 DIAGNOSIS — G8250 Quadriplegia, unspecified: Secondary | ICD-10-CM | POA: Diagnosis not present

## 2020-08-11 DIAGNOSIS — G4733 Obstructive sleep apnea (adult) (pediatric): Secondary | ICD-10-CM | POA: Diagnosis not present

## 2020-08-11 DIAGNOSIS — Z86718 Personal history of other venous thrombosis and embolism: Secondary | ICD-10-CM | POA: Diagnosis not present

## 2020-08-11 DIAGNOSIS — I6931 Attention and concentration deficit following cerebral infarction: Secondary | ICD-10-CM | POA: Diagnosis not present

## 2020-08-11 DIAGNOSIS — Z7401 Bed confinement status: Secondary | ICD-10-CM | POA: Diagnosis not present

## 2020-08-11 DIAGNOSIS — E1165 Type 2 diabetes mellitus with hyperglycemia: Secondary | ICD-10-CM | POA: Diagnosis not present

## 2020-08-11 DIAGNOSIS — G40909 Epilepsy, unspecified, not intractable, without status epilepticus: Secondary | ICD-10-CM | POA: Diagnosis not present

## 2020-08-11 DIAGNOSIS — D649 Anemia, unspecified: Secondary | ICD-10-CM | POA: Diagnosis not present

## 2020-08-11 DIAGNOSIS — Z87440 Personal history of urinary (tract) infections: Secondary | ICD-10-CM | POA: Diagnosis not present

## 2020-08-11 DIAGNOSIS — R000 Tachycardia, unspecified: Secondary | ICD-10-CM | POA: Diagnosis not present

## 2020-08-11 DIAGNOSIS — R339 Retention of urine, unspecified: Secondary | ICD-10-CM | POA: Diagnosis not present

## 2020-08-11 DIAGNOSIS — I69320 Aphasia following cerebral infarction: Secondary | ICD-10-CM | POA: Diagnosis not present

## 2020-08-11 DIAGNOSIS — I69365 Other paralytic syndrome following cerebral infarction, bilateral: Secondary | ICD-10-CM | POA: Diagnosis not present

## 2020-08-11 DIAGNOSIS — I10 Essential (primary) hypertension: Secondary | ICD-10-CM | POA: Diagnosis not present

## 2020-08-12 DIAGNOSIS — I69311 Memory deficit following cerebral infarction: Secondary | ICD-10-CM | POA: Diagnosis not present

## 2020-08-12 DIAGNOSIS — I6931 Attention and concentration deficit following cerebral infarction: Secondary | ICD-10-CM | POA: Diagnosis not present

## 2020-08-12 DIAGNOSIS — I69320 Aphasia following cerebral infarction: Secondary | ICD-10-CM | POA: Diagnosis not present

## 2020-08-12 DIAGNOSIS — E1165 Type 2 diabetes mellitus with hyperglycemia: Secondary | ICD-10-CM | POA: Diagnosis not present

## 2020-08-12 DIAGNOSIS — R000 Tachycardia, unspecified: Secondary | ICD-10-CM | POA: Diagnosis not present

## 2020-08-12 DIAGNOSIS — R339 Retention of urine, unspecified: Secondary | ICD-10-CM | POA: Diagnosis not present

## 2020-08-12 DIAGNOSIS — Z87440 Personal history of urinary (tract) infections: Secondary | ICD-10-CM | POA: Diagnosis not present

## 2020-08-12 DIAGNOSIS — I69365 Other paralytic syndrome following cerebral infarction, bilateral: Secondary | ICD-10-CM | POA: Diagnosis not present

## 2020-08-12 DIAGNOSIS — Z86718 Personal history of other venous thrombosis and embolism: Secondary | ICD-10-CM | POA: Diagnosis not present

## 2020-08-12 DIAGNOSIS — D649 Anemia, unspecified: Secondary | ICD-10-CM | POA: Diagnosis not present

## 2020-08-12 DIAGNOSIS — G8250 Quadriplegia, unspecified: Secondary | ICD-10-CM | POA: Diagnosis not present

## 2020-08-12 DIAGNOSIS — G4733 Obstructive sleep apnea (adult) (pediatric): Secondary | ICD-10-CM | POA: Diagnosis not present

## 2020-08-12 DIAGNOSIS — I10 Essential (primary) hypertension: Secondary | ICD-10-CM | POA: Diagnosis not present

## 2020-08-12 DIAGNOSIS — G40909 Epilepsy, unspecified, not intractable, without status epilepticus: Secondary | ICD-10-CM | POA: Diagnosis not present

## 2020-08-12 DIAGNOSIS — Z7401 Bed confinement status: Secondary | ICD-10-CM | POA: Diagnosis not present

## 2020-08-13 DIAGNOSIS — G8250 Quadriplegia, unspecified: Secondary | ICD-10-CM | POA: Diagnosis not present

## 2020-08-13 DIAGNOSIS — I69320 Aphasia following cerebral infarction: Secondary | ICD-10-CM | POA: Diagnosis not present

## 2020-08-13 DIAGNOSIS — E1165 Type 2 diabetes mellitus with hyperglycemia: Secondary | ICD-10-CM | POA: Diagnosis not present

## 2020-08-13 DIAGNOSIS — G40909 Epilepsy, unspecified, not intractable, without status epilepticus: Secondary | ICD-10-CM | POA: Diagnosis not present

## 2020-08-13 DIAGNOSIS — R339 Retention of urine, unspecified: Secondary | ICD-10-CM | POA: Diagnosis not present

## 2020-08-13 DIAGNOSIS — Z86718 Personal history of other venous thrombosis and embolism: Secondary | ICD-10-CM | POA: Diagnosis not present

## 2020-08-13 DIAGNOSIS — I69365 Other paralytic syndrome following cerebral infarction, bilateral: Secondary | ICD-10-CM | POA: Diagnosis not present

## 2020-08-13 DIAGNOSIS — I69311 Memory deficit following cerebral infarction: Secondary | ICD-10-CM | POA: Diagnosis not present

## 2020-08-13 DIAGNOSIS — I6931 Attention and concentration deficit following cerebral infarction: Secondary | ICD-10-CM | POA: Diagnosis not present

## 2020-08-13 DIAGNOSIS — Z7401 Bed confinement status: Secondary | ICD-10-CM | POA: Diagnosis not present

## 2020-08-13 DIAGNOSIS — I10 Essential (primary) hypertension: Secondary | ICD-10-CM | POA: Diagnosis not present

## 2020-08-13 DIAGNOSIS — D649 Anemia, unspecified: Secondary | ICD-10-CM | POA: Diagnosis not present

## 2020-08-13 DIAGNOSIS — Z87440 Personal history of urinary (tract) infections: Secondary | ICD-10-CM | POA: Diagnosis not present

## 2020-08-13 DIAGNOSIS — R000 Tachycardia, unspecified: Secondary | ICD-10-CM | POA: Diagnosis not present

## 2020-08-13 DIAGNOSIS — G4733 Obstructive sleep apnea (adult) (pediatric): Secondary | ICD-10-CM | POA: Diagnosis not present

## 2020-08-14 DIAGNOSIS — L8994 Pressure ulcer of unspecified site, stage 4: Secondary | ICD-10-CM | POA: Diagnosis not present

## 2020-08-14 DIAGNOSIS — I6389 Other cerebral infarction: Secondary | ICD-10-CM | POA: Diagnosis not present

## 2020-08-17 DIAGNOSIS — Z86718 Personal history of other venous thrombosis and embolism: Secondary | ICD-10-CM | POA: Diagnosis not present

## 2020-08-17 DIAGNOSIS — I69311 Memory deficit following cerebral infarction: Secondary | ICD-10-CM | POA: Diagnosis not present

## 2020-08-17 DIAGNOSIS — G40909 Epilepsy, unspecified, not intractable, without status epilepticus: Secondary | ICD-10-CM | POA: Diagnosis not present

## 2020-08-17 DIAGNOSIS — I6931 Attention and concentration deficit following cerebral infarction: Secondary | ICD-10-CM | POA: Diagnosis not present

## 2020-08-17 DIAGNOSIS — I69320 Aphasia following cerebral infarction: Secondary | ICD-10-CM | POA: Diagnosis not present

## 2020-08-17 DIAGNOSIS — R000 Tachycardia, unspecified: Secondary | ICD-10-CM | POA: Diagnosis not present

## 2020-08-17 DIAGNOSIS — Z7401 Bed confinement status: Secondary | ICD-10-CM | POA: Diagnosis not present

## 2020-08-17 DIAGNOSIS — I69365 Other paralytic syndrome following cerebral infarction, bilateral: Secondary | ICD-10-CM | POA: Diagnosis not present

## 2020-08-17 DIAGNOSIS — I10 Essential (primary) hypertension: Secondary | ICD-10-CM | POA: Diagnosis not present

## 2020-08-17 DIAGNOSIS — G4733 Obstructive sleep apnea (adult) (pediatric): Secondary | ICD-10-CM | POA: Diagnosis not present

## 2020-08-17 DIAGNOSIS — D649 Anemia, unspecified: Secondary | ICD-10-CM | POA: Diagnosis not present

## 2020-08-17 DIAGNOSIS — Z87440 Personal history of urinary (tract) infections: Secondary | ICD-10-CM | POA: Diagnosis not present

## 2020-08-17 DIAGNOSIS — R339 Retention of urine, unspecified: Secondary | ICD-10-CM | POA: Diagnosis not present

## 2020-08-17 DIAGNOSIS — E1165 Type 2 diabetes mellitus with hyperglycemia: Secondary | ICD-10-CM | POA: Diagnosis not present

## 2020-08-17 DIAGNOSIS — G8250 Quadriplegia, unspecified: Secondary | ICD-10-CM | POA: Diagnosis not present

## 2020-08-19 ENCOUNTER — Telehealth: Payer: Self-pay

## 2020-08-19 DIAGNOSIS — I69320 Aphasia following cerebral infarction: Secondary | ICD-10-CM | POA: Diagnosis not present

## 2020-08-19 DIAGNOSIS — I69365 Other paralytic syndrome following cerebral infarction, bilateral: Secondary | ICD-10-CM | POA: Diagnosis not present

## 2020-08-19 DIAGNOSIS — Z7401 Bed confinement status: Secondary | ICD-10-CM | POA: Diagnosis not present

## 2020-08-19 DIAGNOSIS — G40909 Epilepsy, unspecified, not intractable, without status epilepticus: Secondary | ICD-10-CM | POA: Diagnosis not present

## 2020-08-19 DIAGNOSIS — G4733 Obstructive sleep apnea (adult) (pediatric): Secondary | ICD-10-CM | POA: Diagnosis not present

## 2020-08-19 DIAGNOSIS — I10 Essential (primary) hypertension: Secondary | ICD-10-CM | POA: Diagnosis not present

## 2020-08-19 DIAGNOSIS — G8250 Quadriplegia, unspecified: Secondary | ICD-10-CM | POA: Diagnosis not present

## 2020-08-19 DIAGNOSIS — I69311 Memory deficit following cerebral infarction: Secondary | ICD-10-CM | POA: Diagnosis not present

## 2020-08-19 DIAGNOSIS — Z86718 Personal history of other venous thrombosis and embolism: Secondary | ICD-10-CM | POA: Diagnosis not present

## 2020-08-19 DIAGNOSIS — D649 Anemia, unspecified: Secondary | ICD-10-CM | POA: Diagnosis not present

## 2020-08-19 DIAGNOSIS — R000 Tachycardia, unspecified: Secondary | ICD-10-CM | POA: Diagnosis not present

## 2020-08-19 DIAGNOSIS — Z87440 Personal history of urinary (tract) infections: Secondary | ICD-10-CM | POA: Diagnosis not present

## 2020-08-19 DIAGNOSIS — I6931 Attention and concentration deficit following cerebral infarction: Secondary | ICD-10-CM | POA: Diagnosis not present

## 2020-08-19 DIAGNOSIS — R339 Retention of urine, unspecified: Secondary | ICD-10-CM | POA: Diagnosis not present

## 2020-08-19 DIAGNOSIS — E1165 Type 2 diabetes mellitus with hyperglycemia: Secondary | ICD-10-CM | POA: Diagnosis not present

## 2020-08-19 NOTE — Telephone Encounter (Signed)
Volunteer called patient/family on behalf of [facility]. Patient is doing well at this time.  

## 2020-08-24 DIAGNOSIS — G40909 Epilepsy, unspecified, not intractable, without status epilepticus: Secondary | ICD-10-CM | POA: Diagnosis not present

## 2020-08-24 DIAGNOSIS — I10 Essential (primary) hypertension: Secondary | ICD-10-CM | POA: Diagnosis not present

## 2020-08-24 DIAGNOSIS — I69320 Aphasia following cerebral infarction: Secondary | ICD-10-CM | POA: Diagnosis not present

## 2020-08-24 DIAGNOSIS — R000 Tachycardia, unspecified: Secondary | ICD-10-CM | POA: Diagnosis not present

## 2020-08-24 DIAGNOSIS — Z87440 Personal history of urinary (tract) infections: Secondary | ICD-10-CM | POA: Diagnosis not present

## 2020-08-24 DIAGNOSIS — I69311 Memory deficit following cerebral infarction: Secondary | ICD-10-CM | POA: Diagnosis not present

## 2020-08-24 DIAGNOSIS — G4733 Obstructive sleep apnea (adult) (pediatric): Secondary | ICD-10-CM | POA: Diagnosis not present

## 2020-08-24 DIAGNOSIS — Z86718 Personal history of other venous thrombosis and embolism: Secondary | ICD-10-CM | POA: Diagnosis not present

## 2020-08-24 DIAGNOSIS — R339 Retention of urine, unspecified: Secondary | ICD-10-CM | POA: Diagnosis not present

## 2020-08-24 DIAGNOSIS — G8250 Quadriplegia, unspecified: Secondary | ICD-10-CM | POA: Diagnosis not present

## 2020-08-24 DIAGNOSIS — D649 Anemia, unspecified: Secondary | ICD-10-CM | POA: Diagnosis not present

## 2020-08-24 DIAGNOSIS — E1165 Type 2 diabetes mellitus with hyperglycemia: Secondary | ICD-10-CM | POA: Diagnosis not present

## 2020-08-24 DIAGNOSIS — Z7401 Bed confinement status: Secondary | ICD-10-CM | POA: Diagnosis not present

## 2020-08-24 DIAGNOSIS — I69365 Other paralytic syndrome following cerebral infarction, bilateral: Secondary | ICD-10-CM | POA: Diagnosis not present

## 2020-08-24 DIAGNOSIS — I6931 Attention and concentration deficit following cerebral infarction: Secondary | ICD-10-CM | POA: Diagnosis not present

## 2020-08-25 DIAGNOSIS — I69311 Memory deficit following cerebral infarction: Secondary | ICD-10-CM | POA: Diagnosis not present

## 2020-08-25 DIAGNOSIS — Z7401 Bed confinement status: Secondary | ICD-10-CM | POA: Diagnosis not present

## 2020-08-25 DIAGNOSIS — E1165 Type 2 diabetes mellitus with hyperglycemia: Secondary | ICD-10-CM | POA: Diagnosis not present

## 2020-08-25 DIAGNOSIS — D649 Anemia, unspecified: Secondary | ICD-10-CM | POA: Diagnosis not present

## 2020-08-25 DIAGNOSIS — Z87440 Personal history of urinary (tract) infections: Secondary | ICD-10-CM | POA: Diagnosis not present

## 2020-08-25 DIAGNOSIS — R339 Retention of urine, unspecified: Secondary | ICD-10-CM | POA: Diagnosis not present

## 2020-08-25 DIAGNOSIS — G40909 Epilepsy, unspecified, not intractable, without status epilepticus: Secondary | ICD-10-CM | POA: Diagnosis not present

## 2020-08-25 DIAGNOSIS — G8250 Quadriplegia, unspecified: Secondary | ICD-10-CM | POA: Diagnosis not present

## 2020-08-25 DIAGNOSIS — R000 Tachycardia, unspecified: Secondary | ICD-10-CM | POA: Diagnosis not present

## 2020-08-25 DIAGNOSIS — Z86718 Personal history of other venous thrombosis and embolism: Secondary | ICD-10-CM | POA: Diagnosis not present

## 2020-08-25 DIAGNOSIS — I6931 Attention and concentration deficit following cerebral infarction: Secondary | ICD-10-CM | POA: Diagnosis not present

## 2020-08-25 DIAGNOSIS — I69365 Other paralytic syndrome following cerebral infarction, bilateral: Secondary | ICD-10-CM | POA: Diagnosis not present

## 2020-08-25 DIAGNOSIS — I10 Essential (primary) hypertension: Secondary | ICD-10-CM | POA: Diagnosis not present

## 2020-08-25 DIAGNOSIS — G4733 Obstructive sleep apnea (adult) (pediatric): Secondary | ICD-10-CM | POA: Diagnosis not present

## 2020-08-25 DIAGNOSIS — I69320 Aphasia following cerebral infarction: Secondary | ICD-10-CM | POA: Diagnosis not present

## 2020-08-26 DIAGNOSIS — I69320 Aphasia following cerebral infarction: Secondary | ICD-10-CM | POA: Diagnosis not present

## 2020-08-26 DIAGNOSIS — I6931 Attention and concentration deficit following cerebral infarction: Secondary | ICD-10-CM | POA: Diagnosis not present

## 2020-08-26 DIAGNOSIS — G40909 Epilepsy, unspecified, not intractable, without status epilepticus: Secondary | ICD-10-CM | POA: Diagnosis not present

## 2020-08-26 DIAGNOSIS — G4733 Obstructive sleep apnea (adult) (pediatric): Secondary | ICD-10-CM | POA: Diagnosis not present

## 2020-08-26 DIAGNOSIS — Z7401 Bed confinement status: Secondary | ICD-10-CM | POA: Diagnosis not present

## 2020-08-26 DIAGNOSIS — G8250 Quadriplegia, unspecified: Secondary | ICD-10-CM | POA: Diagnosis not present

## 2020-08-26 DIAGNOSIS — D649 Anemia, unspecified: Secondary | ICD-10-CM | POA: Diagnosis not present

## 2020-08-26 DIAGNOSIS — R000 Tachycardia, unspecified: Secondary | ICD-10-CM | POA: Diagnosis not present

## 2020-08-26 DIAGNOSIS — I69311 Memory deficit following cerebral infarction: Secondary | ICD-10-CM | POA: Diagnosis not present

## 2020-08-26 DIAGNOSIS — I10 Essential (primary) hypertension: Secondary | ICD-10-CM | POA: Diagnosis not present

## 2020-08-26 DIAGNOSIS — Z86718 Personal history of other venous thrombosis and embolism: Secondary | ICD-10-CM | POA: Diagnosis not present

## 2020-08-26 DIAGNOSIS — E1165 Type 2 diabetes mellitus with hyperglycemia: Secondary | ICD-10-CM | POA: Diagnosis not present

## 2020-08-26 DIAGNOSIS — R339 Retention of urine, unspecified: Secondary | ICD-10-CM | POA: Diagnosis not present

## 2020-08-26 DIAGNOSIS — Z87440 Personal history of urinary (tract) infections: Secondary | ICD-10-CM | POA: Diagnosis not present

## 2020-08-26 DIAGNOSIS — I69365 Other paralytic syndrome following cerebral infarction, bilateral: Secondary | ICD-10-CM | POA: Diagnosis not present

## 2020-08-28 DIAGNOSIS — I69311 Memory deficit following cerebral infarction: Secondary | ICD-10-CM | POA: Diagnosis not present

## 2020-08-28 DIAGNOSIS — R000 Tachycardia, unspecified: Secondary | ICD-10-CM | POA: Diagnosis not present

## 2020-08-28 DIAGNOSIS — R339 Retention of urine, unspecified: Secondary | ICD-10-CM | POA: Diagnosis not present

## 2020-08-28 DIAGNOSIS — G40909 Epilepsy, unspecified, not intractable, without status epilepticus: Secondary | ICD-10-CM | POA: Diagnosis not present

## 2020-08-28 DIAGNOSIS — I69320 Aphasia following cerebral infarction: Secondary | ICD-10-CM | POA: Diagnosis not present

## 2020-08-28 DIAGNOSIS — G4733 Obstructive sleep apnea (adult) (pediatric): Secondary | ICD-10-CM | POA: Diagnosis not present

## 2020-08-28 DIAGNOSIS — I6931 Attention and concentration deficit following cerebral infarction: Secondary | ICD-10-CM | POA: Diagnosis not present

## 2020-08-28 DIAGNOSIS — Z86718 Personal history of other venous thrombosis and embolism: Secondary | ICD-10-CM | POA: Diagnosis not present

## 2020-08-28 DIAGNOSIS — D649 Anemia, unspecified: Secondary | ICD-10-CM | POA: Diagnosis not present

## 2020-08-28 DIAGNOSIS — Z7401 Bed confinement status: Secondary | ICD-10-CM | POA: Diagnosis not present

## 2020-08-28 DIAGNOSIS — I69365 Other paralytic syndrome following cerebral infarction, bilateral: Secondary | ICD-10-CM | POA: Diagnosis not present

## 2020-08-28 DIAGNOSIS — I10 Essential (primary) hypertension: Secondary | ICD-10-CM | POA: Diagnosis not present

## 2020-08-28 DIAGNOSIS — Z87440 Personal history of urinary (tract) infections: Secondary | ICD-10-CM | POA: Diagnosis not present

## 2020-08-28 DIAGNOSIS — E1165 Type 2 diabetes mellitus with hyperglycemia: Secondary | ICD-10-CM | POA: Diagnosis not present

## 2020-08-28 DIAGNOSIS — G8250 Quadriplegia, unspecified: Secondary | ICD-10-CM | POA: Diagnosis not present

## 2020-08-31 DIAGNOSIS — I6389 Other cerebral infarction: Secondary | ICD-10-CM | POA: Diagnosis not present

## 2020-08-31 DIAGNOSIS — R262 Difficulty in walking, not elsewhere classified: Secondary | ICD-10-CM | POA: Diagnosis not present

## 2020-08-31 DIAGNOSIS — R2689 Other abnormalities of gait and mobility: Secondary | ICD-10-CM | POA: Diagnosis not present

## 2020-09-01 DIAGNOSIS — Z86718 Personal history of other venous thrombosis and embolism: Secondary | ICD-10-CM | POA: Diagnosis not present

## 2020-09-01 DIAGNOSIS — E1165 Type 2 diabetes mellitus with hyperglycemia: Secondary | ICD-10-CM | POA: Diagnosis not present

## 2020-09-01 DIAGNOSIS — Z87440 Personal history of urinary (tract) infections: Secondary | ICD-10-CM | POA: Diagnosis not present

## 2020-09-01 DIAGNOSIS — G40909 Epilepsy, unspecified, not intractable, without status epilepticus: Secondary | ICD-10-CM | POA: Diagnosis not present

## 2020-09-01 DIAGNOSIS — I69311 Memory deficit following cerebral infarction: Secondary | ICD-10-CM | POA: Diagnosis not present

## 2020-09-01 DIAGNOSIS — D649 Anemia, unspecified: Secondary | ICD-10-CM | POA: Diagnosis not present

## 2020-09-01 DIAGNOSIS — R339 Retention of urine, unspecified: Secondary | ICD-10-CM | POA: Diagnosis not present

## 2020-09-01 DIAGNOSIS — I10 Essential (primary) hypertension: Secondary | ICD-10-CM | POA: Diagnosis not present

## 2020-09-01 DIAGNOSIS — G4733 Obstructive sleep apnea (adult) (pediatric): Secondary | ICD-10-CM | POA: Diagnosis not present

## 2020-09-01 DIAGNOSIS — I69365 Other paralytic syndrome following cerebral infarction, bilateral: Secondary | ICD-10-CM | POA: Diagnosis not present

## 2020-09-01 DIAGNOSIS — L8994 Pressure ulcer of unspecified site, stage 4: Secondary | ICD-10-CM | POA: Diagnosis not present

## 2020-09-01 DIAGNOSIS — Z7401 Bed confinement status: Secondary | ICD-10-CM | POA: Diagnosis not present

## 2020-09-01 DIAGNOSIS — I69320 Aphasia following cerebral infarction: Secondary | ICD-10-CM | POA: Diagnosis not present

## 2020-09-01 DIAGNOSIS — G8250 Quadriplegia, unspecified: Secondary | ICD-10-CM | POA: Diagnosis not present

## 2020-09-01 DIAGNOSIS — I6389 Other cerebral infarction: Secondary | ICD-10-CM | POA: Diagnosis not present

## 2020-09-01 DIAGNOSIS — I6931 Attention and concentration deficit following cerebral infarction: Secondary | ICD-10-CM | POA: Diagnosis not present

## 2020-09-01 DIAGNOSIS — R000 Tachycardia, unspecified: Secondary | ICD-10-CM | POA: Diagnosis not present

## 2020-09-02 DIAGNOSIS — Z86718 Personal history of other venous thrombosis and embolism: Secondary | ICD-10-CM | POA: Diagnosis not present

## 2020-09-02 DIAGNOSIS — Z87440 Personal history of urinary (tract) infections: Secondary | ICD-10-CM | POA: Diagnosis not present

## 2020-09-02 DIAGNOSIS — R000 Tachycardia, unspecified: Secondary | ICD-10-CM | POA: Diagnosis not present

## 2020-09-02 DIAGNOSIS — Z7401 Bed confinement status: Secondary | ICD-10-CM | POA: Diagnosis not present

## 2020-09-02 DIAGNOSIS — I69320 Aphasia following cerebral infarction: Secondary | ICD-10-CM | POA: Diagnosis not present

## 2020-09-02 DIAGNOSIS — R339 Retention of urine, unspecified: Secondary | ICD-10-CM | POA: Diagnosis not present

## 2020-09-02 DIAGNOSIS — G40909 Epilepsy, unspecified, not intractable, without status epilepticus: Secondary | ICD-10-CM | POA: Diagnosis not present

## 2020-09-02 DIAGNOSIS — D649 Anemia, unspecified: Secondary | ICD-10-CM | POA: Diagnosis not present

## 2020-09-02 DIAGNOSIS — I69311 Memory deficit following cerebral infarction: Secondary | ICD-10-CM | POA: Diagnosis not present

## 2020-09-02 DIAGNOSIS — I10 Essential (primary) hypertension: Secondary | ICD-10-CM | POA: Diagnosis not present

## 2020-09-02 DIAGNOSIS — I69365 Other paralytic syndrome following cerebral infarction, bilateral: Secondary | ICD-10-CM | POA: Diagnosis not present

## 2020-09-02 DIAGNOSIS — G8250 Quadriplegia, unspecified: Secondary | ICD-10-CM | POA: Diagnosis not present

## 2020-09-02 DIAGNOSIS — E1165 Type 2 diabetes mellitus with hyperglycemia: Secondary | ICD-10-CM | POA: Diagnosis not present

## 2020-09-02 DIAGNOSIS — G4733 Obstructive sleep apnea (adult) (pediatric): Secondary | ICD-10-CM | POA: Diagnosis not present

## 2020-09-02 DIAGNOSIS — I6931 Attention and concentration deficit following cerebral infarction: Secondary | ICD-10-CM | POA: Diagnosis not present

## 2020-09-03 DIAGNOSIS — Z7401 Bed confinement status: Secondary | ICD-10-CM | POA: Diagnosis not present

## 2020-09-03 DIAGNOSIS — G8250 Quadriplegia, unspecified: Secondary | ICD-10-CM | POA: Diagnosis not present

## 2020-09-03 DIAGNOSIS — G4733 Obstructive sleep apnea (adult) (pediatric): Secondary | ICD-10-CM | POA: Diagnosis not present

## 2020-09-03 DIAGNOSIS — I10 Essential (primary) hypertension: Secondary | ICD-10-CM | POA: Diagnosis not present

## 2020-09-03 DIAGNOSIS — I6931 Attention and concentration deficit following cerebral infarction: Secondary | ICD-10-CM | POA: Diagnosis not present

## 2020-09-03 DIAGNOSIS — I69320 Aphasia following cerebral infarction: Secondary | ICD-10-CM | POA: Diagnosis not present

## 2020-09-03 DIAGNOSIS — E1165 Type 2 diabetes mellitus with hyperglycemia: Secondary | ICD-10-CM | POA: Diagnosis not present

## 2020-09-03 DIAGNOSIS — G40909 Epilepsy, unspecified, not intractable, without status epilepticus: Secondary | ICD-10-CM | POA: Diagnosis not present

## 2020-09-03 DIAGNOSIS — D649 Anemia, unspecified: Secondary | ICD-10-CM | POA: Diagnosis not present

## 2020-09-03 DIAGNOSIS — I69311 Memory deficit following cerebral infarction: Secondary | ICD-10-CM | POA: Diagnosis not present

## 2020-09-03 DIAGNOSIS — R339 Retention of urine, unspecified: Secondary | ICD-10-CM | POA: Diagnosis not present

## 2020-09-03 DIAGNOSIS — R000 Tachycardia, unspecified: Secondary | ICD-10-CM | POA: Diagnosis not present

## 2020-09-03 DIAGNOSIS — I69365 Other paralytic syndrome following cerebral infarction, bilateral: Secondary | ICD-10-CM | POA: Diagnosis not present

## 2020-09-03 DIAGNOSIS — Z87440 Personal history of urinary (tract) infections: Secondary | ICD-10-CM | POA: Diagnosis not present

## 2020-09-03 DIAGNOSIS — Z86718 Personal history of other venous thrombosis and embolism: Secondary | ICD-10-CM | POA: Diagnosis not present

## 2020-09-04 DIAGNOSIS — I10 Essential (primary) hypertension: Secondary | ICD-10-CM | POA: Diagnosis not present

## 2020-09-04 DIAGNOSIS — G4733 Obstructive sleep apnea (adult) (pediatric): Secondary | ICD-10-CM | POA: Diagnosis not present

## 2020-09-04 DIAGNOSIS — I69311 Memory deficit following cerebral infarction: Secondary | ICD-10-CM | POA: Diagnosis not present

## 2020-09-04 DIAGNOSIS — G40909 Epilepsy, unspecified, not intractable, without status epilepticus: Secondary | ICD-10-CM | POA: Diagnosis not present

## 2020-09-04 DIAGNOSIS — Z86718 Personal history of other venous thrombosis and embolism: Secondary | ICD-10-CM | POA: Diagnosis not present

## 2020-09-04 DIAGNOSIS — I69320 Aphasia following cerebral infarction: Secondary | ICD-10-CM | POA: Diagnosis not present

## 2020-09-04 DIAGNOSIS — G8250 Quadriplegia, unspecified: Secondary | ICD-10-CM | POA: Diagnosis not present

## 2020-09-04 DIAGNOSIS — R000 Tachycardia, unspecified: Secondary | ICD-10-CM | POA: Diagnosis not present

## 2020-09-04 DIAGNOSIS — D649 Anemia, unspecified: Secondary | ICD-10-CM | POA: Diagnosis not present

## 2020-09-04 DIAGNOSIS — Z87440 Personal history of urinary (tract) infections: Secondary | ICD-10-CM | POA: Diagnosis not present

## 2020-09-04 DIAGNOSIS — Z7401 Bed confinement status: Secondary | ICD-10-CM | POA: Diagnosis not present

## 2020-09-04 DIAGNOSIS — I69365 Other paralytic syndrome following cerebral infarction, bilateral: Secondary | ICD-10-CM | POA: Diagnosis not present

## 2020-09-04 DIAGNOSIS — E1165 Type 2 diabetes mellitus with hyperglycemia: Secondary | ICD-10-CM | POA: Diagnosis not present

## 2020-09-04 DIAGNOSIS — R339 Retention of urine, unspecified: Secondary | ICD-10-CM | POA: Diagnosis not present

## 2020-09-04 DIAGNOSIS — I6931 Attention and concentration deficit following cerebral infarction: Secondary | ICD-10-CM | POA: Diagnosis not present

## 2020-09-08 DIAGNOSIS — I6389 Other cerebral infarction: Secondary | ICD-10-CM | POA: Diagnosis not present

## 2020-09-08 DIAGNOSIS — I10 Essential (primary) hypertension: Secondary | ICD-10-CM | POA: Diagnosis not present

## 2020-09-08 DIAGNOSIS — G40909 Epilepsy, unspecified, not intractable, without status epilepticus: Secondary | ICD-10-CM | POA: Diagnosis not present

## 2020-09-08 DIAGNOSIS — Z86718 Personal history of other venous thrombosis and embolism: Secondary | ICD-10-CM | POA: Diagnosis not present

## 2020-09-08 DIAGNOSIS — D649 Anemia, unspecified: Secondary | ICD-10-CM | POA: Diagnosis not present

## 2020-09-08 DIAGNOSIS — R000 Tachycardia, unspecified: Secondary | ICD-10-CM | POA: Diagnosis not present

## 2020-09-08 DIAGNOSIS — I69311 Memory deficit following cerebral infarction: Secondary | ICD-10-CM | POA: Diagnosis not present

## 2020-09-08 DIAGNOSIS — I69320 Aphasia following cerebral infarction: Secondary | ICD-10-CM | POA: Diagnosis not present

## 2020-09-08 DIAGNOSIS — R339 Retention of urine, unspecified: Secondary | ICD-10-CM | POA: Diagnosis not present

## 2020-09-08 DIAGNOSIS — I6931 Attention and concentration deficit following cerebral infarction: Secondary | ICD-10-CM | POA: Diagnosis not present

## 2020-09-08 DIAGNOSIS — Z7401 Bed confinement status: Secondary | ICD-10-CM | POA: Diagnosis not present

## 2020-09-08 DIAGNOSIS — L8994 Pressure ulcer of unspecified site, stage 4: Secondary | ICD-10-CM | POA: Diagnosis not present

## 2020-09-08 DIAGNOSIS — Z87440 Personal history of urinary (tract) infections: Secondary | ICD-10-CM | POA: Diagnosis not present

## 2020-09-08 DIAGNOSIS — G8250 Quadriplegia, unspecified: Secondary | ICD-10-CM | POA: Diagnosis not present

## 2020-09-08 DIAGNOSIS — I69365 Other paralytic syndrome following cerebral infarction, bilateral: Secondary | ICD-10-CM | POA: Diagnosis not present

## 2020-09-08 DIAGNOSIS — E1165 Type 2 diabetes mellitus with hyperglycemia: Secondary | ICD-10-CM | POA: Diagnosis not present

## 2020-09-08 DIAGNOSIS — G4733 Obstructive sleep apnea (adult) (pediatric): Secondary | ICD-10-CM | POA: Diagnosis not present

## 2020-09-10 DIAGNOSIS — I69320 Aphasia following cerebral infarction: Secondary | ICD-10-CM | POA: Diagnosis not present

## 2020-09-10 DIAGNOSIS — Z86718 Personal history of other venous thrombosis and embolism: Secondary | ICD-10-CM | POA: Diagnosis not present

## 2020-09-10 DIAGNOSIS — Z87440 Personal history of urinary (tract) infections: Secondary | ICD-10-CM | POA: Diagnosis not present

## 2020-09-10 DIAGNOSIS — G40909 Epilepsy, unspecified, not intractable, without status epilepticus: Secondary | ICD-10-CM | POA: Diagnosis not present

## 2020-09-10 DIAGNOSIS — R000 Tachycardia, unspecified: Secondary | ICD-10-CM | POA: Diagnosis not present

## 2020-09-10 DIAGNOSIS — I6931 Attention and concentration deficit following cerebral infarction: Secondary | ICD-10-CM | POA: Diagnosis not present

## 2020-09-10 DIAGNOSIS — I69311 Memory deficit following cerebral infarction: Secondary | ICD-10-CM | POA: Diagnosis not present

## 2020-09-10 DIAGNOSIS — D649 Anemia, unspecified: Secondary | ICD-10-CM | POA: Diagnosis not present

## 2020-09-10 DIAGNOSIS — I69365 Other paralytic syndrome following cerebral infarction, bilateral: Secondary | ICD-10-CM | POA: Diagnosis not present

## 2020-09-10 DIAGNOSIS — G4733 Obstructive sleep apnea (adult) (pediatric): Secondary | ICD-10-CM | POA: Diagnosis not present

## 2020-09-10 DIAGNOSIS — I10 Essential (primary) hypertension: Secondary | ICD-10-CM | POA: Diagnosis not present

## 2020-09-10 DIAGNOSIS — E1165 Type 2 diabetes mellitus with hyperglycemia: Secondary | ICD-10-CM | POA: Diagnosis not present

## 2020-09-10 DIAGNOSIS — G8250 Quadriplegia, unspecified: Secondary | ICD-10-CM | POA: Diagnosis not present

## 2020-09-10 DIAGNOSIS — Z7401 Bed confinement status: Secondary | ICD-10-CM | POA: Diagnosis not present

## 2020-09-10 DIAGNOSIS — R339 Retention of urine, unspecified: Secondary | ICD-10-CM | POA: Diagnosis not present

## 2020-09-11 DIAGNOSIS — G8250 Quadriplegia, unspecified: Secondary | ICD-10-CM | POA: Diagnosis not present

## 2020-09-11 DIAGNOSIS — I69365 Other paralytic syndrome following cerebral infarction, bilateral: Secondary | ICD-10-CM | POA: Diagnosis not present

## 2020-09-11 DIAGNOSIS — Z7401 Bed confinement status: Secondary | ICD-10-CM | POA: Diagnosis not present

## 2020-09-11 DIAGNOSIS — R000 Tachycardia, unspecified: Secondary | ICD-10-CM | POA: Diagnosis not present

## 2020-09-11 DIAGNOSIS — G4733 Obstructive sleep apnea (adult) (pediatric): Secondary | ICD-10-CM | POA: Diagnosis not present

## 2020-09-11 DIAGNOSIS — Z87440 Personal history of urinary (tract) infections: Secondary | ICD-10-CM | POA: Diagnosis not present

## 2020-09-11 DIAGNOSIS — I69320 Aphasia following cerebral infarction: Secondary | ICD-10-CM | POA: Diagnosis not present

## 2020-09-11 DIAGNOSIS — I6931 Attention and concentration deficit following cerebral infarction: Secondary | ICD-10-CM | POA: Diagnosis not present

## 2020-09-11 DIAGNOSIS — E1165 Type 2 diabetes mellitus with hyperglycemia: Secondary | ICD-10-CM | POA: Diagnosis not present

## 2020-09-11 DIAGNOSIS — I69311 Memory deficit following cerebral infarction: Secondary | ICD-10-CM | POA: Diagnosis not present

## 2020-09-11 DIAGNOSIS — Z86718 Personal history of other venous thrombosis and embolism: Secondary | ICD-10-CM | POA: Diagnosis not present

## 2020-09-11 DIAGNOSIS — I10 Essential (primary) hypertension: Secondary | ICD-10-CM | POA: Diagnosis not present

## 2020-09-11 DIAGNOSIS — G40909 Epilepsy, unspecified, not intractable, without status epilepticus: Secondary | ICD-10-CM | POA: Diagnosis not present

## 2020-09-11 DIAGNOSIS — D649 Anemia, unspecified: Secondary | ICD-10-CM | POA: Diagnosis not present

## 2020-09-11 DIAGNOSIS — R339 Retention of urine, unspecified: Secondary | ICD-10-CM | POA: Diagnosis not present

## 2020-09-14 DIAGNOSIS — Z86718 Personal history of other venous thrombosis and embolism: Secondary | ICD-10-CM | POA: Diagnosis not present

## 2020-09-14 DIAGNOSIS — I69320 Aphasia following cerebral infarction: Secondary | ICD-10-CM | POA: Diagnosis not present

## 2020-09-14 DIAGNOSIS — R000 Tachycardia, unspecified: Secondary | ICD-10-CM | POA: Diagnosis not present

## 2020-09-14 DIAGNOSIS — I10 Essential (primary) hypertension: Secondary | ICD-10-CM | POA: Diagnosis not present

## 2020-09-14 DIAGNOSIS — I69365 Other paralytic syndrome following cerebral infarction, bilateral: Secondary | ICD-10-CM | POA: Diagnosis not present

## 2020-09-14 DIAGNOSIS — G4733 Obstructive sleep apnea (adult) (pediatric): Secondary | ICD-10-CM | POA: Diagnosis not present

## 2020-09-14 DIAGNOSIS — I69311 Memory deficit following cerebral infarction: Secondary | ICD-10-CM | POA: Diagnosis not present

## 2020-09-14 DIAGNOSIS — Z87440 Personal history of urinary (tract) infections: Secondary | ICD-10-CM | POA: Diagnosis not present

## 2020-09-14 DIAGNOSIS — G8250 Quadriplegia, unspecified: Secondary | ICD-10-CM | POA: Diagnosis not present

## 2020-09-14 DIAGNOSIS — D649 Anemia, unspecified: Secondary | ICD-10-CM | POA: Diagnosis not present

## 2020-09-14 DIAGNOSIS — Z7401 Bed confinement status: Secondary | ICD-10-CM | POA: Diagnosis not present

## 2020-09-14 DIAGNOSIS — E1165 Type 2 diabetes mellitus with hyperglycemia: Secondary | ICD-10-CM | POA: Diagnosis not present

## 2020-09-14 DIAGNOSIS — R339 Retention of urine, unspecified: Secondary | ICD-10-CM | POA: Diagnosis not present

## 2020-09-14 DIAGNOSIS — G40909 Epilepsy, unspecified, not intractable, without status epilepticus: Secondary | ICD-10-CM | POA: Diagnosis not present

## 2020-09-14 DIAGNOSIS — I6931 Attention and concentration deficit following cerebral infarction: Secondary | ICD-10-CM | POA: Diagnosis not present

## 2020-09-15 DIAGNOSIS — I69311 Memory deficit following cerebral infarction: Secondary | ICD-10-CM | POA: Diagnosis not present

## 2020-09-15 DIAGNOSIS — R000 Tachycardia, unspecified: Secondary | ICD-10-CM | POA: Diagnosis not present

## 2020-09-15 DIAGNOSIS — L8994 Pressure ulcer of unspecified site, stage 4: Secondary | ICD-10-CM | POA: Diagnosis not present

## 2020-09-15 DIAGNOSIS — I6931 Attention and concentration deficit following cerebral infarction: Secondary | ICD-10-CM | POA: Diagnosis not present

## 2020-09-15 DIAGNOSIS — Z7401 Bed confinement status: Secondary | ICD-10-CM | POA: Diagnosis not present

## 2020-09-15 DIAGNOSIS — I6389 Other cerebral infarction: Secondary | ICD-10-CM | POA: Diagnosis not present

## 2020-09-15 DIAGNOSIS — I10 Essential (primary) hypertension: Secondary | ICD-10-CM | POA: Diagnosis not present

## 2020-09-15 DIAGNOSIS — G4733 Obstructive sleep apnea (adult) (pediatric): Secondary | ICD-10-CM | POA: Diagnosis not present

## 2020-09-15 DIAGNOSIS — E1165 Type 2 diabetes mellitus with hyperglycemia: Secondary | ICD-10-CM | POA: Diagnosis not present

## 2020-09-15 DIAGNOSIS — Z86718 Personal history of other venous thrombosis and embolism: Secondary | ICD-10-CM | POA: Diagnosis not present

## 2020-09-15 DIAGNOSIS — D649 Anemia, unspecified: Secondary | ICD-10-CM | POA: Diagnosis not present

## 2020-09-15 DIAGNOSIS — R339 Retention of urine, unspecified: Secondary | ICD-10-CM | POA: Diagnosis not present

## 2020-09-15 DIAGNOSIS — I69320 Aphasia following cerebral infarction: Secondary | ICD-10-CM | POA: Diagnosis not present

## 2020-09-15 DIAGNOSIS — G8250 Quadriplegia, unspecified: Secondary | ICD-10-CM | POA: Diagnosis not present

## 2020-09-15 DIAGNOSIS — G40909 Epilepsy, unspecified, not intractable, without status epilepticus: Secondary | ICD-10-CM | POA: Diagnosis not present

## 2020-09-15 DIAGNOSIS — I69365 Other paralytic syndrome following cerebral infarction, bilateral: Secondary | ICD-10-CM | POA: Diagnosis not present

## 2020-09-15 DIAGNOSIS — Z87440 Personal history of urinary (tract) infections: Secondary | ICD-10-CM | POA: Diagnosis not present

## 2020-09-17 DIAGNOSIS — R339 Retention of urine, unspecified: Secondary | ICD-10-CM | POA: Diagnosis not present

## 2020-09-17 DIAGNOSIS — I10 Essential (primary) hypertension: Secondary | ICD-10-CM | POA: Diagnosis not present

## 2020-09-17 DIAGNOSIS — G4733 Obstructive sleep apnea (adult) (pediatric): Secondary | ICD-10-CM | POA: Diagnosis not present

## 2020-09-17 DIAGNOSIS — E1165 Type 2 diabetes mellitus with hyperglycemia: Secondary | ICD-10-CM | POA: Diagnosis not present

## 2020-09-17 DIAGNOSIS — G40909 Epilepsy, unspecified, not intractable, without status epilepticus: Secondary | ICD-10-CM | POA: Diagnosis not present

## 2020-09-17 DIAGNOSIS — D649 Anemia, unspecified: Secondary | ICD-10-CM | POA: Diagnosis not present

## 2020-09-17 DIAGNOSIS — G8250 Quadriplegia, unspecified: Secondary | ICD-10-CM | POA: Diagnosis not present

## 2020-09-17 DIAGNOSIS — I69365 Other paralytic syndrome following cerebral infarction, bilateral: Secondary | ICD-10-CM | POA: Diagnosis not present

## 2020-09-17 DIAGNOSIS — I69311 Memory deficit following cerebral infarction: Secondary | ICD-10-CM | POA: Diagnosis not present

## 2020-09-17 DIAGNOSIS — Z87440 Personal history of urinary (tract) infections: Secondary | ICD-10-CM | POA: Diagnosis not present

## 2020-09-17 DIAGNOSIS — Z86718 Personal history of other venous thrombosis and embolism: Secondary | ICD-10-CM | POA: Diagnosis not present

## 2020-09-17 DIAGNOSIS — Z7401 Bed confinement status: Secondary | ICD-10-CM | POA: Diagnosis not present

## 2020-09-17 DIAGNOSIS — R000 Tachycardia, unspecified: Secondary | ICD-10-CM | POA: Diagnosis not present

## 2020-09-17 DIAGNOSIS — I6931 Attention and concentration deficit following cerebral infarction: Secondary | ICD-10-CM | POA: Diagnosis not present

## 2020-09-17 DIAGNOSIS — I69320 Aphasia following cerebral infarction: Secondary | ICD-10-CM | POA: Diagnosis not present

## 2020-09-22 DIAGNOSIS — G40909 Epilepsy, unspecified, not intractable, without status epilepticus: Secondary | ICD-10-CM | POA: Diagnosis not present

## 2020-09-22 DIAGNOSIS — Z7401 Bed confinement status: Secondary | ICD-10-CM | POA: Diagnosis not present

## 2020-09-22 DIAGNOSIS — G8250 Quadriplegia, unspecified: Secondary | ICD-10-CM | POA: Diagnosis not present

## 2020-09-22 DIAGNOSIS — R339 Retention of urine, unspecified: Secondary | ICD-10-CM | POA: Diagnosis not present

## 2020-09-22 DIAGNOSIS — I69320 Aphasia following cerebral infarction: Secondary | ICD-10-CM | POA: Diagnosis not present

## 2020-09-22 DIAGNOSIS — D649 Anemia, unspecified: Secondary | ICD-10-CM | POA: Diagnosis not present

## 2020-09-22 DIAGNOSIS — I10 Essential (primary) hypertension: Secondary | ICD-10-CM | POA: Diagnosis not present

## 2020-09-22 DIAGNOSIS — R000 Tachycardia, unspecified: Secondary | ICD-10-CM | POA: Diagnosis not present

## 2020-09-22 DIAGNOSIS — E1165 Type 2 diabetes mellitus with hyperglycemia: Secondary | ICD-10-CM | POA: Diagnosis not present

## 2020-09-22 DIAGNOSIS — G4733 Obstructive sleep apnea (adult) (pediatric): Secondary | ICD-10-CM | POA: Diagnosis not present

## 2020-09-22 DIAGNOSIS — I69311 Memory deficit following cerebral infarction: Secondary | ICD-10-CM | POA: Diagnosis not present

## 2020-09-22 DIAGNOSIS — Z87440 Personal history of urinary (tract) infections: Secondary | ICD-10-CM | POA: Diagnosis not present

## 2020-09-22 DIAGNOSIS — I69365 Other paralytic syndrome following cerebral infarction, bilateral: Secondary | ICD-10-CM | POA: Diagnosis not present

## 2020-09-22 DIAGNOSIS — Z86718 Personal history of other venous thrombosis and embolism: Secondary | ICD-10-CM | POA: Diagnosis not present

## 2020-09-22 DIAGNOSIS — I6931 Attention and concentration deficit following cerebral infarction: Secondary | ICD-10-CM | POA: Diagnosis not present

## 2020-09-23 DIAGNOSIS — Z86718 Personal history of other venous thrombosis and embolism: Secondary | ICD-10-CM | POA: Diagnosis not present

## 2020-09-23 DIAGNOSIS — G8250 Quadriplegia, unspecified: Secondary | ICD-10-CM | POA: Diagnosis not present

## 2020-09-23 DIAGNOSIS — Z87440 Personal history of urinary (tract) infections: Secondary | ICD-10-CM | POA: Diagnosis not present

## 2020-09-23 DIAGNOSIS — Z7401 Bed confinement status: Secondary | ICD-10-CM | POA: Diagnosis not present

## 2020-09-23 DIAGNOSIS — R000 Tachycardia, unspecified: Secondary | ICD-10-CM | POA: Diagnosis not present

## 2020-09-23 DIAGNOSIS — D649 Anemia, unspecified: Secondary | ICD-10-CM | POA: Diagnosis not present

## 2020-09-23 DIAGNOSIS — G40909 Epilepsy, unspecified, not intractable, without status epilepticus: Secondary | ICD-10-CM | POA: Diagnosis not present

## 2020-09-23 DIAGNOSIS — R339 Retention of urine, unspecified: Secondary | ICD-10-CM | POA: Diagnosis not present

## 2020-09-23 DIAGNOSIS — G4733 Obstructive sleep apnea (adult) (pediatric): Secondary | ICD-10-CM | POA: Diagnosis not present

## 2020-09-23 DIAGNOSIS — I6931 Attention and concentration deficit following cerebral infarction: Secondary | ICD-10-CM | POA: Diagnosis not present

## 2020-09-23 DIAGNOSIS — I69365 Other paralytic syndrome following cerebral infarction, bilateral: Secondary | ICD-10-CM | POA: Diagnosis not present

## 2020-09-23 DIAGNOSIS — I10 Essential (primary) hypertension: Secondary | ICD-10-CM | POA: Diagnosis not present

## 2020-09-23 DIAGNOSIS — I69311 Memory deficit following cerebral infarction: Secondary | ICD-10-CM | POA: Diagnosis not present

## 2020-09-23 DIAGNOSIS — I69320 Aphasia following cerebral infarction: Secondary | ICD-10-CM | POA: Diagnosis not present

## 2020-09-23 DIAGNOSIS — E1165 Type 2 diabetes mellitus with hyperglycemia: Secondary | ICD-10-CM | POA: Diagnosis not present

## 2020-09-24 DIAGNOSIS — I6931 Attention and concentration deficit following cerebral infarction: Secondary | ICD-10-CM | POA: Diagnosis not present

## 2020-09-24 DIAGNOSIS — D649 Anemia, unspecified: Secondary | ICD-10-CM | POA: Diagnosis not present

## 2020-09-24 DIAGNOSIS — I69365 Other paralytic syndrome following cerebral infarction, bilateral: Secondary | ICD-10-CM | POA: Diagnosis not present

## 2020-09-24 DIAGNOSIS — E1165 Type 2 diabetes mellitus with hyperglycemia: Secondary | ICD-10-CM | POA: Diagnosis not present

## 2020-09-24 DIAGNOSIS — I69311 Memory deficit following cerebral infarction: Secondary | ICD-10-CM | POA: Diagnosis not present

## 2020-09-24 DIAGNOSIS — Z87440 Personal history of urinary (tract) infections: Secondary | ICD-10-CM | POA: Diagnosis not present

## 2020-09-24 DIAGNOSIS — G40909 Epilepsy, unspecified, not intractable, without status epilepticus: Secondary | ICD-10-CM | POA: Diagnosis not present

## 2020-09-24 DIAGNOSIS — G8250 Quadriplegia, unspecified: Secondary | ICD-10-CM | POA: Diagnosis not present

## 2020-09-24 DIAGNOSIS — Z7401 Bed confinement status: Secondary | ICD-10-CM | POA: Diagnosis not present

## 2020-09-24 DIAGNOSIS — G4733 Obstructive sleep apnea (adult) (pediatric): Secondary | ICD-10-CM | POA: Diagnosis not present

## 2020-09-24 DIAGNOSIS — Z86718 Personal history of other venous thrombosis and embolism: Secondary | ICD-10-CM | POA: Diagnosis not present

## 2020-09-24 DIAGNOSIS — R339 Retention of urine, unspecified: Secondary | ICD-10-CM | POA: Diagnosis not present

## 2020-09-24 DIAGNOSIS — I10 Essential (primary) hypertension: Secondary | ICD-10-CM | POA: Diagnosis not present

## 2020-09-24 DIAGNOSIS — R000 Tachycardia, unspecified: Secondary | ICD-10-CM | POA: Diagnosis not present

## 2020-09-24 DIAGNOSIS — I69320 Aphasia following cerebral infarction: Secondary | ICD-10-CM | POA: Diagnosis not present

## 2020-09-25 DIAGNOSIS — R000 Tachycardia, unspecified: Secondary | ICD-10-CM | POA: Diagnosis not present

## 2020-09-25 DIAGNOSIS — G40909 Epilepsy, unspecified, not intractable, without status epilepticus: Secondary | ICD-10-CM | POA: Diagnosis not present

## 2020-09-25 DIAGNOSIS — Z7401 Bed confinement status: Secondary | ICD-10-CM | POA: Diagnosis not present

## 2020-09-25 DIAGNOSIS — E1165 Type 2 diabetes mellitus with hyperglycemia: Secondary | ICD-10-CM | POA: Diagnosis not present

## 2020-09-25 DIAGNOSIS — D649 Anemia, unspecified: Secondary | ICD-10-CM | POA: Diagnosis not present

## 2020-09-25 DIAGNOSIS — G4733 Obstructive sleep apnea (adult) (pediatric): Secondary | ICD-10-CM | POA: Diagnosis not present

## 2020-09-25 DIAGNOSIS — G8250 Quadriplegia, unspecified: Secondary | ICD-10-CM | POA: Diagnosis not present

## 2020-09-25 DIAGNOSIS — I69311 Memory deficit following cerebral infarction: Secondary | ICD-10-CM | POA: Diagnosis not present

## 2020-09-25 DIAGNOSIS — I69365 Other paralytic syndrome following cerebral infarction, bilateral: Secondary | ICD-10-CM | POA: Diagnosis not present

## 2020-09-25 DIAGNOSIS — Z87440 Personal history of urinary (tract) infections: Secondary | ICD-10-CM | POA: Diagnosis not present

## 2020-09-25 DIAGNOSIS — Z86718 Personal history of other venous thrombosis and embolism: Secondary | ICD-10-CM | POA: Diagnosis not present

## 2020-09-25 DIAGNOSIS — I10 Essential (primary) hypertension: Secondary | ICD-10-CM | POA: Diagnosis not present

## 2020-09-25 DIAGNOSIS — R339 Retention of urine, unspecified: Secondary | ICD-10-CM | POA: Diagnosis not present

## 2020-09-25 DIAGNOSIS — I69320 Aphasia following cerebral infarction: Secondary | ICD-10-CM | POA: Diagnosis not present

## 2020-09-25 DIAGNOSIS — I6931 Attention and concentration deficit following cerebral infarction: Secondary | ICD-10-CM | POA: Diagnosis not present

## 2020-09-29 DIAGNOSIS — D649 Anemia, unspecified: Secondary | ICD-10-CM | POA: Diagnosis not present

## 2020-09-29 DIAGNOSIS — R000 Tachycardia, unspecified: Secondary | ICD-10-CM | POA: Diagnosis not present

## 2020-09-29 DIAGNOSIS — Z7401 Bed confinement status: Secondary | ICD-10-CM | POA: Diagnosis not present

## 2020-09-29 DIAGNOSIS — G4733 Obstructive sleep apnea (adult) (pediatric): Secondary | ICD-10-CM | POA: Diagnosis not present

## 2020-09-29 DIAGNOSIS — Z87440 Personal history of urinary (tract) infections: Secondary | ICD-10-CM | POA: Diagnosis not present

## 2020-09-29 DIAGNOSIS — E1165 Type 2 diabetes mellitus with hyperglycemia: Secondary | ICD-10-CM | POA: Diagnosis not present

## 2020-09-29 DIAGNOSIS — R339 Retention of urine, unspecified: Secondary | ICD-10-CM | POA: Diagnosis not present

## 2020-09-29 DIAGNOSIS — G40909 Epilepsy, unspecified, not intractable, without status epilepticus: Secondary | ICD-10-CM | POA: Diagnosis not present

## 2020-09-29 DIAGNOSIS — I10 Essential (primary) hypertension: Secondary | ICD-10-CM | POA: Diagnosis not present

## 2020-09-29 DIAGNOSIS — I69311 Memory deficit following cerebral infarction: Secondary | ICD-10-CM | POA: Diagnosis not present

## 2020-09-29 DIAGNOSIS — I69320 Aphasia following cerebral infarction: Secondary | ICD-10-CM | POA: Diagnosis not present

## 2020-09-29 DIAGNOSIS — Z86718 Personal history of other venous thrombosis and embolism: Secondary | ICD-10-CM | POA: Diagnosis not present

## 2020-09-29 DIAGNOSIS — I69365 Other paralytic syndrome following cerebral infarction, bilateral: Secondary | ICD-10-CM | POA: Diagnosis not present

## 2020-09-29 DIAGNOSIS — I6931 Attention and concentration deficit following cerebral infarction: Secondary | ICD-10-CM | POA: Diagnosis not present

## 2020-09-29 DIAGNOSIS — G8250 Quadriplegia, unspecified: Secondary | ICD-10-CM | POA: Diagnosis not present

## 2020-09-30 DIAGNOSIS — I6931 Attention and concentration deficit following cerebral infarction: Secondary | ICD-10-CM | POA: Diagnosis not present

## 2020-09-30 DIAGNOSIS — Z87440 Personal history of urinary (tract) infections: Secondary | ICD-10-CM | POA: Diagnosis not present

## 2020-09-30 DIAGNOSIS — I69320 Aphasia following cerebral infarction: Secondary | ICD-10-CM | POA: Diagnosis not present

## 2020-09-30 DIAGNOSIS — Z86718 Personal history of other venous thrombosis and embolism: Secondary | ICD-10-CM | POA: Diagnosis not present

## 2020-09-30 DIAGNOSIS — I69365 Other paralytic syndrome following cerebral infarction, bilateral: Secondary | ICD-10-CM | POA: Diagnosis not present

## 2020-09-30 DIAGNOSIS — G4733 Obstructive sleep apnea (adult) (pediatric): Secondary | ICD-10-CM | POA: Diagnosis not present

## 2020-09-30 DIAGNOSIS — Z7401 Bed confinement status: Secondary | ICD-10-CM | POA: Diagnosis not present

## 2020-09-30 DIAGNOSIS — G40909 Epilepsy, unspecified, not intractable, without status epilepticus: Secondary | ICD-10-CM | POA: Diagnosis not present

## 2020-09-30 DIAGNOSIS — G8250 Quadriplegia, unspecified: Secondary | ICD-10-CM | POA: Diagnosis not present

## 2020-09-30 DIAGNOSIS — E119 Type 2 diabetes mellitus without complications: Secondary | ICD-10-CM | POA: Diagnosis not present

## 2020-09-30 DIAGNOSIS — I10 Essential (primary) hypertension: Secondary | ICD-10-CM | POA: Diagnosis not present

## 2020-09-30 DIAGNOSIS — R339 Retention of urine, unspecified: Secondary | ICD-10-CM | POA: Diagnosis not present

## 2020-09-30 DIAGNOSIS — D649 Anemia, unspecified: Secondary | ICD-10-CM | POA: Diagnosis not present

## 2020-09-30 DIAGNOSIS — I69311 Memory deficit following cerebral infarction: Secondary | ICD-10-CM | POA: Diagnosis not present

## 2020-09-30 DIAGNOSIS — R000 Tachycardia, unspecified: Secondary | ICD-10-CM | POA: Diagnosis not present

## 2020-10-01 DIAGNOSIS — I69320 Aphasia following cerebral infarction: Secondary | ICD-10-CM | POA: Diagnosis not present

## 2020-10-01 DIAGNOSIS — I10 Essential (primary) hypertension: Secondary | ICD-10-CM | POA: Diagnosis not present

## 2020-10-01 DIAGNOSIS — Z87440 Personal history of urinary (tract) infections: Secondary | ICD-10-CM | POA: Diagnosis not present

## 2020-10-01 DIAGNOSIS — Z86718 Personal history of other venous thrombosis and embolism: Secondary | ICD-10-CM | POA: Diagnosis not present

## 2020-10-01 DIAGNOSIS — I69365 Other paralytic syndrome following cerebral infarction, bilateral: Secondary | ICD-10-CM | POA: Diagnosis not present

## 2020-10-01 DIAGNOSIS — R339 Retention of urine, unspecified: Secondary | ICD-10-CM | POA: Diagnosis not present

## 2020-10-01 DIAGNOSIS — R000 Tachycardia, unspecified: Secondary | ICD-10-CM | POA: Diagnosis not present

## 2020-10-01 DIAGNOSIS — G40909 Epilepsy, unspecified, not intractable, without status epilepticus: Secondary | ICD-10-CM | POA: Diagnosis not present

## 2020-10-01 DIAGNOSIS — G8250 Quadriplegia, unspecified: Secondary | ICD-10-CM | POA: Diagnosis not present

## 2020-10-01 DIAGNOSIS — E1165 Type 2 diabetes mellitus with hyperglycemia: Secondary | ICD-10-CM | POA: Diagnosis not present

## 2020-10-01 DIAGNOSIS — R32 Unspecified urinary incontinence: Secondary | ICD-10-CM | POA: Diagnosis not present

## 2020-10-01 DIAGNOSIS — G4733 Obstructive sleep apnea (adult) (pediatric): Secondary | ICD-10-CM | POA: Diagnosis not present

## 2020-10-01 DIAGNOSIS — D649 Anemia, unspecified: Secondary | ICD-10-CM | POA: Diagnosis not present

## 2020-10-01 DIAGNOSIS — I6389 Other cerebral infarction: Secondary | ICD-10-CM | POA: Diagnosis not present

## 2020-10-01 DIAGNOSIS — Z7401 Bed confinement status: Secondary | ICD-10-CM | POA: Diagnosis not present

## 2020-10-01 DIAGNOSIS — R262 Difficulty in walking, not elsewhere classified: Secondary | ICD-10-CM | POA: Diagnosis not present

## 2020-10-01 DIAGNOSIS — I69311 Memory deficit following cerebral infarction: Secondary | ICD-10-CM | POA: Diagnosis not present

## 2020-10-01 DIAGNOSIS — L8994 Pressure ulcer of unspecified site, stage 4: Secondary | ICD-10-CM | POA: Diagnosis not present

## 2020-10-01 DIAGNOSIS — R2689 Other abnormalities of gait and mobility: Secondary | ICD-10-CM | POA: Diagnosis not present

## 2020-10-01 DIAGNOSIS — I6931 Attention and concentration deficit following cerebral infarction: Secondary | ICD-10-CM | POA: Diagnosis not present

## 2020-10-06 DIAGNOSIS — I69320 Aphasia following cerebral infarction: Secondary | ICD-10-CM | POA: Diagnosis not present

## 2020-10-06 DIAGNOSIS — D649 Anemia, unspecified: Secondary | ICD-10-CM | POA: Diagnosis not present

## 2020-10-06 DIAGNOSIS — G8250 Quadriplegia, unspecified: Secondary | ICD-10-CM | POA: Diagnosis not present

## 2020-10-06 DIAGNOSIS — R339 Retention of urine, unspecified: Secondary | ICD-10-CM | POA: Diagnosis not present

## 2020-10-06 DIAGNOSIS — Z86718 Personal history of other venous thrombosis and embolism: Secondary | ICD-10-CM | POA: Diagnosis not present

## 2020-10-06 DIAGNOSIS — E119 Type 2 diabetes mellitus without complications: Secondary | ICD-10-CM | POA: Diagnosis not present

## 2020-10-06 DIAGNOSIS — Z7401 Bed confinement status: Secondary | ICD-10-CM | POA: Diagnosis not present

## 2020-10-06 DIAGNOSIS — G40909 Epilepsy, unspecified, not intractable, without status epilepticus: Secondary | ICD-10-CM | POA: Diagnosis not present

## 2020-10-06 DIAGNOSIS — I6931 Attention and concentration deficit following cerebral infarction: Secondary | ICD-10-CM | POA: Diagnosis not present

## 2020-10-06 DIAGNOSIS — I10 Essential (primary) hypertension: Secondary | ICD-10-CM | POA: Diagnosis not present

## 2020-10-06 DIAGNOSIS — I69365 Other paralytic syndrome following cerebral infarction, bilateral: Secondary | ICD-10-CM | POA: Diagnosis not present

## 2020-10-06 DIAGNOSIS — R000 Tachycardia, unspecified: Secondary | ICD-10-CM | POA: Diagnosis not present

## 2020-10-06 DIAGNOSIS — G4733 Obstructive sleep apnea (adult) (pediatric): Secondary | ICD-10-CM | POA: Diagnosis not present

## 2020-10-06 DIAGNOSIS — I69311 Memory deficit following cerebral infarction: Secondary | ICD-10-CM | POA: Diagnosis not present

## 2020-10-06 DIAGNOSIS — Z87440 Personal history of urinary (tract) infections: Secondary | ICD-10-CM | POA: Diagnosis not present

## 2020-10-07 DIAGNOSIS — I69311 Memory deficit following cerebral infarction: Secondary | ICD-10-CM | POA: Diagnosis not present

## 2020-10-07 DIAGNOSIS — I10 Essential (primary) hypertension: Secondary | ICD-10-CM | POA: Diagnosis not present

## 2020-10-07 DIAGNOSIS — Z7401 Bed confinement status: Secondary | ICD-10-CM | POA: Diagnosis not present

## 2020-10-07 DIAGNOSIS — I69365 Other paralytic syndrome following cerebral infarction, bilateral: Secondary | ICD-10-CM | POA: Diagnosis not present

## 2020-10-07 DIAGNOSIS — Z86718 Personal history of other venous thrombosis and embolism: Secondary | ICD-10-CM | POA: Diagnosis not present

## 2020-10-07 DIAGNOSIS — D649 Anemia, unspecified: Secondary | ICD-10-CM | POA: Diagnosis not present

## 2020-10-07 DIAGNOSIS — I69320 Aphasia following cerebral infarction: Secondary | ICD-10-CM | POA: Diagnosis not present

## 2020-10-07 DIAGNOSIS — G4733 Obstructive sleep apnea (adult) (pediatric): Secondary | ICD-10-CM | POA: Diagnosis not present

## 2020-10-07 DIAGNOSIS — E119 Type 2 diabetes mellitus without complications: Secondary | ICD-10-CM | POA: Diagnosis not present

## 2020-10-07 DIAGNOSIS — G40909 Epilepsy, unspecified, not intractable, without status epilepticus: Secondary | ICD-10-CM | POA: Diagnosis not present

## 2020-10-07 DIAGNOSIS — I6931 Attention and concentration deficit following cerebral infarction: Secondary | ICD-10-CM | POA: Diagnosis not present

## 2020-10-07 DIAGNOSIS — Z87440 Personal history of urinary (tract) infections: Secondary | ICD-10-CM | POA: Diagnosis not present

## 2020-10-07 DIAGNOSIS — G8250 Quadriplegia, unspecified: Secondary | ICD-10-CM | POA: Diagnosis not present

## 2020-10-07 DIAGNOSIS — R339 Retention of urine, unspecified: Secondary | ICD-10-CM | POA: Diagnosis not present

## 2020-10-07 DIAGNOSIS — R000 Tachycardia, unspecified: Secondary | ICD-10-CM | POA: Diagnosis not present

## 2020-10-09 DIAGNOSIS — R000 Tachycardia, unspecified: Secondary | ICD-10-CM | POA: Diagnosis not present

## 2020-10-09 DIAGNOSIS — G8250 Quadriplegia, unspecified: Secondary | ICD-10-CM | POA: Diagnosis not present

## 2020-10-09 DIAGNOSIS — I69320 Aphasia following cerebral infarction: Secondary | ICD-10-CM | POA: Diagnosis not present

## 2020-10-09 DIAGNOSIS — I10 Essential (primary) hypertension: Secondary | ICD-10-CM | POA: Diagnosis not present

## 2020-10-09 DIAGNOSIS — Z7401 Bed confinement status: Secondary | ICD-10-CM | POA: Diagnosis not present

## 2020-10-09 DIAGNOSIS — Z86718 Personal history of other venous thrombosis and embolism: Secondary | ICD-10-CM | POA: Diagnosis not present

## 2020-10-09 DIAGNOSIS — I6931 Attention and concentration deficit following cerebral infarction: Secondary | ICD-10-CM | POA: Diagnosis not present

## 2020-10-09 DIAGNOSIS — G4733 Obstructive sleep apnea (adult) (pediatric): Secondary | ICD-10-CM | POA: Diagnosis not present

## 2020-10-09 DIAGNOSIS — Z87440 Personal history of urinary (tract) infections: Secondary | ICD-10-CM | POA: Diagnosis not present

## 2020-10-09 DIAGNOSIS — E119 Type 2 diabetes mellitus without complications: Secondary | ICD-10-CM | POA: Diagnosis not present

## 2020-10-09 DIAGNOSIS — R339 Retention of urine, unspecified: Secondary | ICD-10-CM | POA: Diagnosis not present

## 2020-10-09 DIAGNOSIS — G40909 Epilepsy, unspecified, not intractable, without status epilepticus: Secondary | ICD-10-CM | POA: Diagnosis not present

## 2020-10-09 DIAGNOSIS — I69311 Memory deficit following cerebral infarction: Secondary | ICD-10-CM | POA: Diagnosis not present

## 2020-10-09 DIAGNOSIS — I69365 Other paralytic syndrome following cerebral infarction, bilateral: Secondary | ICD-10-CM | POA: Diagnosis not present

## 2020-10-09 DIAGNOSIS — D649 Anemia, unspecified: Secondary | ICD-10-CM | POA: Diagnosis not present

## 2020-10-10 DIAGNOSIS — I69365 Other paralytic syndrome following cerebral infarction, bilateral: Secondary | ICD-10-CM | POA: Diagnosis not present

## 2020-10-10 DIAGNOSIS — R000 Tachycardia, unspecified: Secondary | ICD-10-CM | POA: Diagnosis not present

## 2020-10-10 DIAGNOSIS — E119 Type 2 diabetes mellitus without complications: Secondary | ICD-10-CM | POA: Diagnosis not present

## 2020-10-10 DIAGNOSIS — I69311 Memory deficit following cerebral infarction: Secondary | ICD-10-CM | POA: Diagnosis not present

## 2020-10-10 DIAGNOSIS — G40909 Epilepsy, unspecified, not intractable, without status epilepticus: Secondary | ICD-10-CM | POA: Diagnosis not present

## 2020-10-10 DIAGNOSIS — D649 Anemia, unspecified: Secondary | ICD-10-CM | POA: Diagnosis not present

## 2020-10-10 DIAGNOSIS — G8250 Quadriplegia, unspecified: Secondary | ICD-10-CM | POA: Diagnosis not present

## 2020-10-10 DIAGNOSIS — G4733 Obstructive sleep apnea (adult) (pediatric): Secondary | ICD-10-CM | POA: Diagnosis not present

## 2020-10-10 DIAGNOSIS — Z87440 Personal history of urinary (tract) infections: Secondary | ICD-10-CM | POA: Diagnosis not present

## 2020-10-10 DIAGNOSIS — R339 Retention of urine, unspecified: Secondary | ICD-10-CM | POA: Diagnosis not present

## 2020-10-10 DIAGNOSIS — I69320 Aphasia following cerebral infarction: Secondary | ICD-10-CM | POA: Diagnosis not present

## 2020-10-10 DIAGNOSIS — Z7401 Bed confinement status: Secondary | ICD-10-CM | POA: Diagnosis not present

## 2020-10-10 DIAGNOSIS — I6931 Attention and concentration deficit following cerebral infarction: Secondary | ICD-10-CM | POA: Diagnosis not present

## 2020-10-10 DIAGNOSIS — I10 Essential (primary) hypertension: Secondary | ICD-10-CM | POA: Diagnosis not present

## 2020-10-10 DIAGNOSIS — Z86718 Personal history of other venous thrombosis and embolism: Secondary | ICD-10-CM | POA: Diagnosis not present

## 2020-10-12 DIAGNOSIS — R339 Retention of urine, unspecified: Secondary | ICD-10-CM | POA: Diagnosis not present

## 2020-10-12 DIAGNOSIS — I69365 Other paralytic syndrome following cerebral infarction, bilateral: Secondary | ICD-10-CM | POA: Diagnosis not present

## 2020-10-12 DIAGNOSIS — R000 Tachycardia, unspecified: Secondary | ICD-10-CM | POA: Diagnosis not present

## 2020-10-12 DIAGNOSIS — I6931 Attention and concentration deficit following cerebral infarction: Secondary | ICD-10-CM | POA: Diagnosis not present

## 2020-10-12 DIAGNOSIS — G8250 Quadriplegia, unspecified: Secondary | ICD-10-CM | POA: Diagnosis not present

## 2020-10-12 DIAGNOSIS — D649 Anemia, unspecified: Secondary | ICD-10-CM | POA: Diagnosis not present

## 2020-10-12 DIAGNOSIS — Z86718 Personal history of other venous thrombosis and embolism: Secondary | ICD-10-CM | POA: Diagnosis not present

## 2020-10-12 DIAGNOSIS — I69311 Memory deficit following cerebral infarction: Secondary | ICD-10-CM | POA: Diagnosis not present

## 2020-10-12 DIAGNOSIS — Z87440 Personal history of urinary (tract) infections: Secondary | ICD-10-CM | POA: Diagnosis not present

## 2020-10-12 DIAGNOSIS — I10 Essential (primary) hypertension: Secondary | ICD-10-CM | POA: Diagnosis not present

## 2020-10-12 DIAGNOSIS — G4733 Obstructive sleep apnea (adult) (pediatric): Secondary | ICD-10-CM | POA: Diagnosis not present

## 2020-10-12 DIAGNOSIS — Z7401 Bed confinement status: Secondary | ICD-10-CM | POA: Diagnosis not present

## 2020-10-12 DIAGNOSIS — E119 Type 2 diabetes mellitus without complications: Secondary | ICD-10-CM | POA: Diagnosis not present

## 2020-10-12 DIAGNOSIS — I69320 Aphasia following cerebral infarction: Secondary | ICD-10-CM | POA: Diagnosis not present

## 2020-10-12 DIAGNOSIS — G40909 Epilepsy, unspecified, not intractable, without status epilepticus: Secondary | ICD-10-CM | POA: Diagnosis not present

## 2020-10-13 DIAGNOSIS — Z87440 Personal history of urinary (tract) infections: Secondary | ICD-10-CM | POA: Diagnosis not present

## 2020-10-13 DIAGNOSIS — G8250 Quadriplegia, unspecified: Secondary | ICD-10-CM | POA: Diagnosis not present

## 2020-10-13 DIAGNOSIS — I6931 Attention and concentration deficit following cerebral infarction: Secondary | ICD-10-CM | POA: Diagnosis not present

## 2020-10-13 DIAGNOSIS — Z86718 Personal history of other venous thrombosis and embolism: Secondary | ICD-10-CM | POA: Diagnosis not present

## 2020-10-13 DIAGNOSIS — I69320 Aphasia following cerebral infarction: Secondary | ICD-10-CM | POA: Diagnosis not present

## 2020-10-13 DIAGNOSIS — R339 Retention of urine, unspecified: Secondary | ICD-10-CM | POA: Diagnosis not present

## 2020-10-13 DIAGNOSIS — R000 Tachycardia, unspecified: Secondary | ICD-10-CM | POA: Diagnosis not present

## 2020-10-13 DIAGNOSIS — E119 Type 2 diabetes mellitus without complications: Secondary | ICD-10-CM | POA: Diagnosis not present

## 2020-10-13 DIAGNOSIS — Z7401 Bed confinement status: Secondary | ICD-10-CM | POA: Diagnosis not present

## 2020-10-13 DIAGNOSIS — D649 Anemia, unspecified: Secondary | ICD-10-CM | POA: Diagnosis not present

## 2020-10-13 DIAGNOSIS — I10 Essential (primary) hypertension: Secondary | ICD-10-CM | POA: Diagnosis not present

## 2020-10-13 DIAGNOSIS — G40909 Epilepsy, unspecified, not intractable, without status epilepticus: Secondary | ICD-10-CM | POA: Diagnosis not present

## 2020-10-13 DIAGNOSIS — G4733 Obstructive sleep apnea (adult) (pediatric): Secondary | ICD-10-CM | POA: Diagnosis not present

## 2020-10-13 DIAGNOSIS — I69311 Memory deficit following cerebral infarction: Secondary | ICD-10-CM | POA: Diagnosis not present

## 2020-10-13 DIAGNOSIS — I69365 Other paralytic syndrome following cerebral infarction, bilateral: Secondary | ICD-10-CM | POA: Diagnosis not present

## 2020-10-14 DIAGNOSIS — Z681 Body mass index (BMI) 19 or less, adult: Secondary | ICD-10-CM | POA: Diagnosis not present

## 2020-10-14 DIAGNOSIS — E119 Type 2 diabetes mellitus without complications: Secondary | ICD-10-CM | POA: Diagnosis not present

## 2020-10-14 DIAGNOSIS — Z0001 Encounter for general adult medical examination with abnormal findings: Secondary | ICD-10-CM | POA: Diagnosis not present

## 2020-10-14 DIAGNOSIS — E1165 Type 2 diabetes mellitus with hyperglycemia: Secondary | ICD-10-CM | POA: Diagnosis not present

## 2020-10-14 DIAGNOSIS — I639 Cerebral infarction, unspecified: Secondary | ICD-10-CM | POA: Diagnosis not present

## 2020-10-14 DIAGNOSIS — Z0000 Encounter for general adult medical examination without abnormal findings: Secondary | ICD-10-CM | POA: Diagnosis not present

## 2020-10-14 DIAGNOSIS — G4733 Obstructive sleep apnea (adult) (pediatric): Secondary | ICD-10-CM | POA: Diagnosis not present

## 2020-10-14 DIAGNOSIS — I10 Essential (primary) hypertension: Secondary | ICD-10-CM | POA: Diagnosis not present

## 2020-10-15 DIAGNOSIS — G40909 Epilepsy, unspecified, not intractable, without status epilepticus: Secondary | ICD-10-CM | POA: Diagnosis not present

## 2020-10-15 DIAGNOSIS — G4733 Obstructive sleep apnea (adult) (pediatric): Secondary | ICD-10-CM | POA: Diagnosis not present

## 2020-10-15 DIAGNOSIS — R339 Retention of urine, unspecified: Secondary | ICD-10-CM | POA: Diagnosis not present

## 2020-10-15 DIAGNOSIS — I6931 Attention and concentration deficit following cerebral infarction: Secondary | ICD-10-CM | POA: Diagnosis not present

## 2020-10-15 DIAGNOSIS — I10 Essential (primary) hypertension: Secondary | ICD-10-CM | POA: Diagnosis not present

## 2020-10-15 DIAGNOSIS — Z7401 Bed confinement status: Secondary | ICD-10-CM | POA: Diagnosis not present

## 2020-10-15 DIAGNOSIS — I69365 Other paralytic syndrome following cerebral infarction, bilateral: Secondary | ICD-10-CM | POA: Diagnosis not present

## 2020-10-15 DIAGNOSIS — I69320 Aphasia following cerebral infarction: Secondary | ICD-10-CM | POA: Diagnosis not present

## 2020-10-15 DIAGNOSIS — Z87440 Personal history of urinary (tract) infections: Secondary | ICD-10-CM | POA: Diagnosis not present

## 2020-10-15 DIAGNOSIS — D649 Anemia, unspecified: Secondary | ICD-10-CM | POA: Diagnosis not present

## 2020-10-15 DIAGNOSIS — I69311 Memory deficit following cerebral infarction: Secondary | ICD-10-CM | POA: Diagnosis not present

## 2020-10-15 DIAGNOSIS — R000 Tachycardia, unspecified: Secondary | ICD-10-CM | POA: Diagnosis not present

## 2020-10-15 DIAGNOSIS — Z86718 Personal history of other venous thrombosis and embolism: Secondary | ICD-10-CM | POA: Diagnosis not present

## 2020-10-15 DIAGNOSIS — E119 Type 2 diabetes mellitus without complications: Secondary | ICD-10-CM | POA: Diagnosis not present

## 2020-10-15 DIAGNOSIS — G8250 Quadriplegia, unspecified: Secondary | ICD-10-CM | POA: Diagnosis not present

## 2020-10-16 DIAGNOSIS — I69365 Other paralytic syndrome following cerebral infarction, bilateral: Secondary | ICD-10-CM | POA: Diagnosis not present

## 2020-10-16 DIAGNOSIS — I10 Essential (primary) hypertension: Secondary | ICD-10-CM | POA: Diagnosis not present

## 2020-10-16 DIAGNOSIS — Z7401 Bed confinement status: Secondary | ICD-10-CM | POA: Diagnosis not present

## 2020-10-16 DIAGNOSIS — Z86718 Personal history of other venous thrombosis and embolism: Secondary | ICD-10-CM | POA: Diagnosis not present

## 2020-10-16 DIAGNOSIS — Z87440 Personal history of urinary (tract) infections: Secondary | ICD-10-CM | POA: Diagnosis not present

## 2020-10-16 DIAGNOSIS — I6931 Attention and concentration deficit following cerebral infarction: Secondary | ICD-10-CM | POA: Diagnosis not present

## 2020-10-16 DIAGNOSIS — D649 Anemia, unspecified: Secondary | ICD-10-CM | POA: Diagnosis not present

## 2020-10-16 DIAGNOSIS — I69320 Aphasia following cerebral infarction: Secondary | ICD-10-CM | POA: Diagnosis not present

## 2020-10-16 DIAGNOSIS — G40909 Epilepsy, unspecified, not intractable, without status epilepticus: Secondary | ICD-10-CM | POA: Diagnosis not present

## 2020-10-16 DIAGNOSIS — E119 Type 2 diabetes mellitus without complications: Secondary | ICD-10-CM | POA: Diagnosis not present

## 2020-10-16 DIAGNOSIS — G8250 Quadriplegia, unspecified: Secondary | ICD-10-CM | POA: Diagnosis not present

## 2020-10-16 DIAGNOSIS — R339 Retention of urine, unspecified: Secondary | ICD-10-CM | POA: Diagnosis not present

## 2020-10-16 DIAGNOSIS — G4733 Obstructive sleep apnea (adult) (pediatric): Secondary | ICD-10-CM | POA: Diagnosis not present

## 2020-10-16 DIAGNOSIS — I69311 Memory deficit following cerebral infarction: Secondary | ICD-10-CM | POA: Diagnosis not present

## 2020-10-16 DIAGNOSIS — R000 Tachycardia, unspecified: Secondary | ICD-10-CM | POA: Diagnosis not present

## 2020-10-20 DIAGNOSIS — G4733 Obstructive sleep apnea (adult) (pediatric): Secondary | ICD-10-CM | POA: Diagnosis not present

## 2020-10-20 DIAGNOSIS — G8250 Quadriplegia, unspecified: Secondary | ICD-10-CM | POA: Diagnosis not present

## 2020-10-20 DIAGNOSIS — L8994 Pressure ulcer of unspecified site, stage 4: Secondary | ICD-10-CM | POA: Diagnosis not present

## 2020-10-20 DIAGNOSIS — Z7401 Bed confinement status: Secondary | ICD-10-CM | POA: Diagnosis not present

## 2020-10-20 DIAGNOSIS — Z87440 Personal history of urinary (tract) infections: Secondary | ICD-10-CM | POA: Diagnosis not present

## 2020-10-20 DIAGNOSIS — I6389 Other cerebral infarction: Secondary | ICD-10-CM | POA: Diagnosis not present

## 2020-10-20 DIAGNOSIS — I69320 Aphasia following cerebral infarction: Secondary | ICD-10-CM | POA: Diagnosis not present

## 2020-10-20 DIAGNOSIS — G40909 Epilepsy, unspecified, not intractable, without status epilepticus: Secondary | ICD-10-CM | POA: Diagnosis not present

## 2020-10-20 DIAGNOSIS — I10 Essential (primary) hypertension: Secondary | ICD-10-CM | POA: Diagnosis not present

## 2020-10-20 DIAGNOSIS — Z86718 Personal history of other venous thrombosis and embolism: Secondary | ICD-10-CM | POA: Diagnosis not present

## 2020-10-20 DIAGNOSIS — D649 Anemia, unspecified: Secondary | ICD-10-CM | POA: Diagnosis not present

## 2020-10-20 DIAGNOSIS — E119 Type 2 diabetes mellitus without complications: Secondary | ICD-10-CM | POA: Diagnosis not present

## 2020-10-20 DIAGNOSIS — I6931 Attention and concentration deficit following cerebral infarction: Secondary | ICD-10-CM | POA: Diagnosis not present

## 2020-10-20 DIAGNOSIS — R339 Retention of urine, unspecified: Secondary | ICD-10-CM | POA: Diagnosis not present

## 2020-10-20 DIAGNOSIS — I69311 Memory deficit following cerebral infarction: Secondary | ICD-10-CM | POA: Diagnosis not present

## 2020-10-20 DIAGNOSIS — I69365 Other paralytic syndrome following cerebral infarction, bilateral: Secondary | ICD-10-CM | POA: Diagnosis not present

## 2020-10-20 DIAGNOSIS — R000 Tachycardia, unspecified: Secondary | ICD-10-CM | POA: Diagnosis not present

## 2020-10-21 DIAGNOSIS — I69311 Memory deficit following cerebral infarction: Secondary | ICD-10-CM | POA: Diagnosis not present

## 2020-10-21 DIAGNOSIS — I69320 Aphasia following cerebral infarction: Secondary | ICD-10-CM | POA: Diagnosis not present

## 2020-10-21 DIAGNOSIS — D649 Anemia, unspecified: Secondary | ICD-10-CM | POA: Diagnosis not present

## 2020-10-21 DIAGNOSIS — Z7401 Bed confinement status: Secondary | ICD-10-CM | POA: Diagnosis not present

## 2020-10-21 DIAGNOSIS — G8250 Quadriplegia, unspecified: Secondary | ICD-10-CM | POA: Diagnosis not present

## 2020-10-21 DIAGNOSIS — R000 Tachycardia, unspecified: Secondary | ICD-10-CM | POA: Diagnosis not present

## 2020-10-21 DIAGNOSIS — G40909 Epilepsy, unspecified, not intractable, without status epilepticus: Secondary | ICD-10-CM | POA: Diagnosis not present

## 2020-10-21 DIAGNOSIS — Z87440 Personal history of urinary (tract) infections: Secondary | ICD-10-CM | POA: Diagnosis not present

## 2020-10-21 DIAGNOSIS — I69365 Other paralytic syndrome following cerebral infarction, bilateral: Secondary | ICD-10-CM | POA: Diagnosis not present

## 2020-10-21 DIAGNOSIS — I6931 Attention and concentration deficit following cerebral infarction: Secondary | ICD-10-CM | POA: Diagnosis not present

## 2020-10-21 DIAGNOSIS — R339 Retention of urine, unspecified: Secondary | ICD-10-CM | POA: Diagnosis not present

## 2020-10-21 DIAGNOSIS — Z86718 Personal history of other venous thrombosis and embolism: Secondary | ICD-10-CM | POA: Diagnosis not present

## 2020-10-21 DIAGNOSIS — E119 Type 2 diabetes mellitus without complications: Secondary | ICD-10-CM | POA: Diagnosis not present

## 2020-10-21 DIAGNOSIS — I10 Essential (primary) hypertension: Secondary | ICD-10-CM | POA: Diagnosis not present

## 2020-10-21 DIAGNOSIS — G4733 Obstructive sleep apnea (adult) (pediatric): Secondary | ICD-10-CM | POA: Diagnosis not present

## 2020-10-22 DIAGNOSIS — I69365 Other paralytic syndrome following cerebral infarction, bilateral: Secondary | ICD-10-CM | POA: Diagnosis not present

## 2020-10-22 DIAGNOSIS — I69320 Aphasia following cerebral infarction: Secondary | ICD-10-CM | POA: Diagnosis not present

## 2020-10-22 DIAGNOSIS — I10 Essential (primary) hypertension: Secondary | ICD-10-CM | POA: Diagnosis not present

## 2020-10-22 DIAGNOSIS — I69311 Memory deficit following cerebral infarction: Secondary | ICD-10-CM | POA: Diagnosis not present

## 2020-10-22 DIAGNOSIS — R339 Retention of urine, unspecified: Secondary | ICD-10-CM | POA: Diagnosis not present

## 2020-10-22 DIAGNOSIS — I6931 Attention and concentration deficit following cerebral infarction: Secondary | ICD-10-CM | POA: Diagnosis not present

## 2020-10-22 DIAGNOSIS — G8250 Quadriplegia, unspecified: Secondary | ICD-10-CM | POA: Diagnosis not present

## 2020-10-22 DIAGNOSIS — Z86718 Personal history of other venous thrombosis and embolism: Secondary | ICD-10-CM | POA: Diagnosis not present

## 2020-10-22 DIAGNOSIS — D649 Anemia, unspecified: Secondary | ICD-10-CM | POA: Diagnosis not present

## 2020-10-22 DIAGNOSIS — Z7401 Bed confinement status: Secondary | ICD-10-CM | POA: Diagnosis not present

## 2020-10-22 DIAGNOSIS — G4733 Obstructive sleep apnea (adult) (pediatric): Secondary | ICD-10-CM | POA: Diagnosis not present

## 2020-10-22 DIAGNOSIS — R000 Tachycardia, unspecified: Secondary | ICD-10-CM | POA: Diagnosis not present

## 2020-10-22 DIAGNOSIS — E119 Type 2 diabetes mellitus without complications: Secondary | ICD-10-CM | POA: Diagnosis not present

## 2020-10-22 DIAGNOSIS — G40909 Epilepsy, unspecified, not intractable, without status epilepticus: Secondary | ICD-10-CM | POA: Diagnosis not present

## 2020-10-22 DIAGNOSIS — Z87440 Personal history of urinary (tract) infections: Secondary | ICD-10-CM | POA: Diagnosis not present

## 2020-10-26 DIAGNOSIS — Z7401 Bed confinement status: Secondary | ICD-10-CM | POA: Diagnosis not present

## 2020-10-26 DIAGNOSIS — I6931 Attention and concentration deficit following cerebral infarction: Secondary | ICD-10-CM | POA: Diagnosis not present

## 2020-10-26 DIAGNOSIS — Z86718 Personal history of other venous thrombosis and embolism: Secondary | ICD-10-CM | POA: Diagnosis not present

## 2020-10-26 DIAGNOSIS — D649 Anemia, unspecified: Secondary | ICD-10-CM | POA: Diagnosis not present

## 2020-10-26 DIAGNOSIS — I10 Essential (primary) hypertension: Secondary | ICD-10-CM | POA: Diagnosis not present

## 2020-10-26 DIAGNOSIS — G40909 Epilepsy, unspecified, not intractable, without status epilepticus: Secondary | ICD-10-CM | POA: Diagnosis not present

## 2020-10-26 DIAGNOSIS — Z87440 Personal history of urinary (tract) infections: Secondary | ICD-10-CM | POA: Diagnosis not present

## 2020-10-26 DIAGNOSIS — E119 Type 2 diabetes mellitus without complications: Secondary | ICD-10-CM | POA: Diagnosis not present

## 2020-10-26 DIAGNOSIS — I69365 Other paralytic syndrome following cerebral infarction, bilateral: Secondary | ICD-10-CM | POA: Diagnosis not present

## 2020-10-26 DIAGNOSIS — I69320 Aphasia following cerebral infarction: Secondary | ICD-10-CM | POA: Diagnosis not present

## 2020-10-26 DIAGNOSIS — I69311 Memory deficit following cerebral infarction: Secondary | ICD-10-CM | POA: Diagnosis not present

## 2020-10-26 DIAGNOSIS — R339 Retention of urine, unspecified: Secondary | ICD-10-CM | POA: Diagnosis not present

## 2020-10-26 DIAGNOSIS — R000 Tachycardia, unspecified: Secondary | ICD-10-CM | POA: Diagnosis not present

## 2020-10-26 DIAGNOSIS — G4733 Obstructive sleep apnea (adult) (pediatric): Secondary | ICD-10-CM | POA: Diagnosis not present

## 2020-10-26 DIAGNOSIS — G8250 Quadriplegia, unspecified: Secondary | ICD-10-CM | POA: Diagnosis not present

## 2020-10-27 DIAGNOSIS — R339 Retention of urine, unspecified: Secondary | ICD-10-CM | POA: Diagnosis not present

## 2020-10-27 DIAGNOSIS — I6931 Attention and concentration deficit following cerebral infarction: Secondary | ICD-10-CM | POA: Diagnosis not present

## 2020-10-27 DIAGNOSIS — Z87440 Personal history of urinary (tract) infections: Secondary | ICD-10-CM | POA: Diagnosis not present

## 2020-10-27 DIAGNOSIS — G4733 Obstructive sleep apnea (adult) (pediatric): Secondary | ICD-10-CM | POA: Diagnosis not present

## 2020-10-27 DIAGNOSIS — Z7401 Bed confinement status: Secondary | ICD-10-CM | POA: Diagnosis not present

## 2020-10-27 DIAGNOSIS — I69320 Aphasia following cerebral infarction: Secondary | ICD-10-CM | POA: Diagnosis not present

## 2020-10-27 DIAGNOSIS — G8250 Quadriplegia, unspecified: Secondary | ICD-10-CM | POA: Diagnosis not present

## 2020-10-27 DIAGNOSIS — I10 Essential (primary) hypertension: Secondary | ICD-10-CM | POA: Diagnosis not present

## 2020-10-27 DIAGNOSIS — I69365 Other paralytic syndrome following cerebral infarction, bilateral: Secondary | ICD-10-CM | POA: Diagnosis not present

## 2020-10-27 DIAGNOSIS — R000 Tachycardia, unspecified: Secondary | ICD-10-CM | POA: Diagnosis not present

## 2020-10-27 DIAGNOSIS — D649 Anemia, unspecified: Secondary | ICD-10-CM | POA: Diagnosis not present

## 2020-10-27 DIAGNOSIS — H52223 Regular astigmatism, bilateral: Secondary | ICD-10-CM | POA: Diagnosis not present

## 2020-10-27 DIAGNOSIS — I69311 Memory deficit following cerebral infarction: Secondary | ICD-10-CM | POA: Diagnosis not present

## 2020-10-27 DIAGNOSIS — E119 Type 2 diabetes mellitus without complications: Secondary | ICD-10-CM | POA: Diagnosis not present

## 2020-10-27 DIAGNOSIS — G40909 Epilepsy, unspecified, not intractable, without status epilepticus: Secondary | ICD-10-CM | POA: Diagnosis not present

## 2020-10-27 DIAGNOSIS — Z86718 Personal history of other venous thrombosis and embolism: Secondary | ICD-10-CM | POA: Diagnosis not present

## 2020-10-28 DIAGNOSIS — L8994 Pressure ulcer of unspecified site, stage 4: Secondary | ICD-10-CM | POA: Diagnosis not present

## 2020-10-28 DIAGNOSIS — I69311 Memory deficit following cerebral infarction: Secondary | ICD-10-CM | POA: Diagnosis not present

## 2020-10-28 DIAGNOSIS — I6389 Other cerebral infarction: Secondary | ICD-10-CM | POA: Diagnosis not present

## 2020-10-28 DIAGNOSIS — D649 Anemia, unspecified: Secondary | ICD-10-CM | POA: Diagnosis not present

## 2020-10-28 DIAGNOSIS — R339 Retention of urine, unspecified: Secondary | ICD-10-CM | POA: Diagnosis not present

## 2020-10-28 DIAGNOSIS — I10 Essential (primary) hypertension: Secondary | ICD-10-CM | POA: Diagnosis not present

## 2020-10-28 DIAGNOSIS — Z7401 Bed confinement status: Secondary | ICD-10-CM | POA: Diagnosis not present

## 2020-10-28 DIAGNOSIS — I69320 Aphasia following cerebral infarction: Secondary | ICD-10-CM | POA: Diagnosis not present

## 2020-10-28 DIAGNOSIS — Z86718 Personal history of other venous thrombosis and embolism: Secondary | ICD-10-CM | POA: Diagnosis not present

## 2020-10-28 DIAGNOSIS — I69365 Other paralytic syndrome following cerebral infarction, bilateral: Secondary | ICD-10-CM | POA: Diagnosis not present

## 2020-10-28 DIAGNOSIS — R32 Unspecified urinary incontinence: Secondary | ICD-10-CM | POA: Diagnosis not present

## 2020-10-28 DIAGNOSIS — R000 Tachycardia, unspecified: Secondary | ICD-10-CM | POA: Diagnosis not present

## 2020-10-28 DIAGNOSIS — G4733 Obstructive sleep apnea (adult) (pediatric): Secondary | ICD-10-CM | POA: Diagnosis not present

## 2020-10-28 DIAGNOSIS — G8250 Quadriplegia, unspecified: Secondary | ICD-10-CM | POA: Diagnosis not present

## 2020-10-28 DIAGNOSIS — I6931 Attention and concentration deficit following cerebral infarction: Secondary | ICD-10-CM | POA: Diagnosis not present

## 2020-10-28 DIAGNOSIS — G40909 Epilepsy, unspecified, not intractable, without status epilepticus: Secondary | ICD-10-CM | POA: Diagnosis not present

## 2020-10-28 DIAGNOSIS — E119 Type 2 diabetes mellitus without complications: Secondary | ICD-10-CM | POA: Diagnosis not present

## 2020-10-28 DIAGNOSIS — Z87440 Personal history of urinary (tract) infections: Secondary | ICD-10-CM | POA: Diagnosis not present

## 2020-10-29 DIAGNOSIS — I69365 Other paralytic syndrome following cerebral infarction, bilateral: Secondary | ICD-10-CM | POA: Diagnosis not present

## 2020-10-29 DIAGNOSIS — I6931 Attention and concentration deficit following cerebral infarction: Secondary | ICD-10-CM | POA: Diagnosis not present

## 2020-10-29 DIAGNOSIS — R000 Tachycardia, unspecified: Secondary | ICD-10-CM | POA: Diagnosis not present

## 2020-10-29 DIAGNOSIS — I10 Essential (primary) hypertension: Secondary | ICD-10-CM | POA: Diagnosis not present

## 2020-10-29 DIAGNOSIS — Z86718 Personal history of other venous thrombosis and embolism: Secondary | ICD-10-CM | POA: Diagnosis not present

## 2020-10-29 DIAGNOSIS — G8250 Quadriplegia, unspecified: Secondary | ICD-10-CM | POA: Diagnosis not present

## 2020-10-29 DIAGNOSIS — G40909 Epilepsy, unspecified, not intractable, without status epilepticus: Secondary | ICD-10-CM | POA: Diagnosis not present

## 2020-10-29 DIAGNOSIS — E119 Type 2 diabetes mellitus without complications: Secondary | ICD-10-CM | POA: Diagnosis not present

## 2020-10-29 DIAGNOSIS — G4733 Obstructive sleep apnea (adult) (pediatric): Secondary | ICD-10-CM | POA: Diagnosis not present

## 2020-10-29 DIAGNOSIS — I69320 Aphasia following cerebral infarction: Secondary | ICD-10-CM | POA: Diagnosis not present

## 2020-10-29 DIAGNOSIS — D649 Anemia, unspecified: Secondary | ICD-10-CM | POA: Diagnosis not present

## 2020-10-29 DIAGNOSIS — I69311 Memory deficit following cerebral infarction: Secondary | ICD-10-CM | POA: Diagnosis not present

## 2020-10-29 DIAGNOSIS — Z87440 Personal history of urinary (tract) infections: Secondary | ICD-10-CM | POA: Diagnosis not present

## 2020-10-29 DIAGNOSIS — Z7401 Bed confinement status: Secondary | ICD-10-CM | POA: Diagnosis not present

## 2020-10-29 DIAGNOSIS — R339 Retention of urine, unspecified: Secondary | ICD-10-CM | POA: Diagnosis not present

## 2020-10-31 DIAGNOSIS — I6389 Other cerebral infarction: Secondary | ICD-10-CM | POA: Diagnosis not present

## 2020-10-31 DIAGNOSIS — R262 Difficulty in walking, not elsewhere classified: Secondary | ICD-10-CM | POA: Diagnosis not present

## 2020-10-31 DIAGNOSIS — R2689 Other abnormalities of gait and mobility: Secondary | ICD-10-CM | POA: Diagnosis not present

## 2020-11-03 DIAGNOSIS — I10 Essential (primary) hypertension: Secondary | ICD-10-CM | POA: Diagnosis not present

## 2020-11-03 DIAGNOSIS — D649 Anemia, unspecified: Secondary | ICD-10-CM | POA: Diagnosis not present

## 2020-11-03 DIAGNOSIS — I69311 Memory deficit following cerebral infarction: Secondary | ICD-10-CM | POA: Diagnosis not present

## 2020-11-03 DIAGNOSIS — R339 Retention of urine, unspecified: Secondary | ICD-10-CM | POA: Diagnosis not present

## 2020-11-03 DIAGNOSIS — G40909 Epilepsy, unspecified, not intractable, without status epilepticus: Secondary | ICD-10-CM | POA: Diagnosis not present

## 2020-11-03 DIAGNOSIS — Z7401 Bed confinement status: Secondary | ICD-10-CM | POA: Diagnosis not present

## 2020-11-03 DIAGNOSIS — Z86718 Personal history of other venous thrombosis and embolism: Secondary | ICD-10-CM | POA: Diagnosis not present

## 2020-11-03 DIAGNOSIS — R000 Tachycardia, unspecified: Secondary | ICD-10-CM | POA: Diagnosis not present

## 2020-11-03 DIAGNOSIS — G4733 Obstructive sleep apnea (adult) (pediatric): Secondary | ICD-10-CM | POA: Diagnosis not present

## 2020-11-03 DIAGNOSIS — I69320 Aphasia following cerebral infarction: Secondary | ICD-10-CM | POA: Diagnosis not present

## 2020-11-03 DIAGNOSIS — G8250 Quadriplegia, unspecified: Secondary | ICD-10-CM | POA: Diagnosis not present

## 2020-11-03 DIAGNOSIS — I69365 Other paralytic syndrome following cerebral infarction, bilateral: Secondary | ICD-10-CM | POA: Diagnosis not present

## 2020-11-03 DIAGNOSIS — E119 Type 2 diabetes mellitus without complications: Secondary | ICD-10-CM | POA: Diagnosis not present

## 2020-11-03 DIAGNOSIS — I6931 Attention and concentration deficit following cerebral infarction: Secondary | ICD-10-CM | POA: Diagnosis not present

## 2020-11-03 DIAGNOSIS — Z87440 Personal history of urinary (tract) infections: Secondary | ICD-10-CM | POA: Diagnosis not present

## 2020-11-04 DIAGNOSIS — R000 Tachycardia, unspecified: Secondary | ICD-10-CM | POA: Diagnosis not present

## 2020-11-04 DIAGNOSIS — I10 Essential (primary) hypertension: Secondary | ICD-10-CM | POA: Diagnosis not present

## 2020-11-04 DIAGNOSIS — G8250 Quadriplegia, unspecified: Secondary | ICD-10-CM | POA: Diagnosis not present

## 2020-11-04 DIAGNOSIS — G4733 Obstructive sleep apnea (adult) (pediatric): Secondary | ICD-10-CM | POA: Diagnosis not present

## 2020-11-04 DIAGNOSIS — I69320 Aphasia following cerebral infarction: Secondary | ICD-10-CM | POA: Diagnosis not present

## 2020-11-04 DIAGNOSIS — R339 Retention of urine, unspecified: Secondary | ICD-10-CM | POA: Diagnosis not present

## 2020-11-04 DIAGNOSIS — Z87440 Personal history of urinary (tract) infections: Secondary | ICD-10-CM | POA: Diagnosis not present

## 2020-11-04 DIAGNOSIS — Z86718 Personal history of other venous thrombosis and embolism: Secondary | ICD-10-CM | POA: Diagnosis not present

## 2020-11-04 DIAGNOSIS — D649 Anemia, unspecified: Secondary | ICD-10-CM | POA: Diagnosis not present

## 2020-11-04 DIAGNOSIS — Z7401 Bed confinement status: Secondary | ICD-10-CM | POA: Diagnosis not present

## 2020-11-04 DIAGNOSIS — G40909 Epilepsy, unspecified, not intractable, without status epilepticus: Secondary | ICD-10-CM | POA: Diagnosis not present

## 2020-11-04 DIAGNOSIS — I6931 Attention and concentration deficit following cerebral infarction: Secondary | ICD-10-CM | POA: Diagnosis not present

## 2020-11-04 DIAGNOSIS — I69365 Other paralytic syndrome following cerebral infarction, bilateral: Secondary | ICD-10-CM | POA: Diagnosis not present

## 2020-11-04 DIAGNOSIS — I69311 Memory deficit following cerebral infarction: Secondary | ICD-10-CM | POA: Diagnosis not present

## 2020-11-04 DIAGNOSIS — E119 Type 2 diabetes mellitus without complications: Secondary | ICD-10-CM | POA: Diagnosis not present

## 2020-11-05 DIAGNOSIS — I69311 Memory deficit following cerebral infarction: Secondary | ICD-10-CM | POA: Diagnosis not present

## 2020-11-05 DIAGNOSIS — G4733 Obstructive sleep apnea (adult) (pediatric): Secondary | ICD-10-CM | POA: Diagnosis not present

## 2020-11-05 DIAGNOSIS — Z86718 Personal history of other venous thrombosis and embolism: Secondary | ICD-10-CM | POA: Diagnosis not present

## 2020-11-05 DIAGNOSIS — Z87440 Personal history of urinary (tract) infections: Secondary | ICD-10-CM | POA: Diagnosis not present

## 2020-11-05 DIAGNOSIS — E119 Type 2 diabetes mellitus without complications: Secondary | ICD-10-CM | POA: Diagnosis not present

## 2020-11-05 DIAGNOSIS — G40909 Epilepsy, unspecified, not intractable, without status epilepticus: Secondary | ICD-10-CM | POA: Diagnosis not present

## 2020-11-05 DIAGNOSIS — G8250 Quadriplegia, unspecified: Secondary | ICD-10-CM | POA: Diagnosis not present

## 2020-11-05 DIAGNOSIS — D649 Anemia, unspecified: Secondary | ICD-10-CM | POA: Diagnosis not present

## 2020-11-05 DIAGNOSIS — I10 Essential (primary) hypertension: Secondary | ICD-10-CM | POA: Diagnosis not present

## 2020-11-05 DIAGNOSIS — R339 Retention of urine, unspecified: Secondary | ICD-10-CM | POA: Diagnosis not present

## 2020-11-05 DIAGNOSIS — Z7401 Bed confinement status: Secondary | ICD-10-CM | POA: Diagnosis not present

## 2020-11-05 DIAGNOSIS — I69365 Other paralytic syndrome following cerebral infarction, bilateral: Secondary | ICD-10-CM | POA: Diagnosis not present

## 2020-11-05 DIAGNOSIS — R000 Tachycardia, unspecified: Secondary | ICD-10-CM | POA: Diagnosis not present

## 2020-11-05 DIAGNOSIS — I69320 Aphasia following cerebral infarction: Secondary | ICD-10-CM | POA: Diagnosis not present

## 2020-11-05 DIAGNOSIS — I6931 Attention and concentration deficit following cerebral infarction: Secondary | ICD-10-CM | POA: Diagnosis not present

## 2020-11-06 DIAGNOSIS — Z86718 Personal history of other venous thrombosis and embolism: Secondary | ICD-10-CM | POA: Diagnosis not present

## 2020-11-06 DIAGNOSIS — I69320 Aphasia following cerebral infarction: Secondary | ICD-10-CM | POA: Diagnosis not present

## 2020-11-06 DIAGNOSIS — R339 Retention of urine, unspecified: Secondary | ICD-10-CM | POA: Diagnosis not present

## 2020-11-06 DIAGNOSIS — I6931 Attention and concentration deficit following cerebral infarction: Secondary | ICD-10-CM | POA: Diagnosis not present

## 2020-11-06 DIAGNOSIS — G8250 Quadriplegia, unspecified: Secondary | ICD-10-CM | POA: Diagnosis not present

## 2020-11-06 DIAGNOSIS — I10 Essential (primary) hypertension: Secondary | ICD-10-CM | POA: Diagnosis not present

## 2020-11-06 DIAGNOSIS — Z7401 Bed confinement status: Secondary | ICD-10-CM | POA: Diagnosis not present

## 2020-11-06 DIAGNOSIS — G4733 Obstructive sleep apnea (adult) (pediatric): Secondary | ICD-10-CM | POA: Diagnosis not present

## 2020-11-06 DIAGNOSIS — I69311 Memory deficit following cerebral infarction: Secondary | ICD-10-CM | POA: Diagnosis not present

## 2020-11-06 DIAGNOSIS — R000 Tachycardia, unspecified: Secondary | ICD-10-CM | POA: Diagnosis not present

## 2020-11-06 DIAGNOSIS — E119 Type 2 diabetes mellitus without complications: Secondary | ICD-10-CM | POA: Diagnosis not present

## 2020-11-06 DIAGNOSIS — G40909 Epilepsy, unspecified, not intractable, without status epilepticus: Secondary | ICD-10-CM | POA: Diagnosis not present

## 2020-11-06 DIAGNOSIS — I69365 Other paralytic syndrome following cerebral infarction, bilateral: Secondary | ICD-10-CM | POA: Diagnosis not present

## 2020-11-06 DIAGNOSIS — Z87440 Personal history of urinary (tract) infections: Secondary | ICD-10-CM | POA: Diagnosis not present

## 2020-11-06 DIAGNOSIS — D649 Anemia, unspecified: Secondary | ICD-10-CM | POA: Diagnosis not present

## 2020-11-09 DIAGNOSIS — I69311 Memory deficit following cerebral infarction: Secondary | ICD-10-CM | POA: Diagnosis not present

## 2020-11-09 DIAGNOSIS — G40909 Epilepsy, unspecified, not intractable, without status epilepticus: Secondary | ICD-10-CM | POA: Diagnosis not present

## 2020-11-09 DIAGNOSIS — I10 Essential (primary) hypertension: Secondary | ICD-10-CM | POA: Diagnosis not present

## 2020-11-09 DIAGNOSIS — I69365 Other paralytic syndrome following cerebral infarction, bilateral: Secondary | ICD-10-CM | POA: Diagnosis not present

## 2020-11-09 DIAGNOSIS — Z87440 Personal history of urinary (tract) infections: Secondary | ICD-10-CM | POA: Diagnosis not present

## 2020-11-09 DIAGNOSIS — Z86718 Personal history of other venous thrombosis and embolism: Secondary | ICD-10-CM | POA: Diagnosis not present

## 2020-11-09 DIAGNOSIS — G4733 Obstructive sleep apnea (adult) (pediatric): Secondary | ICD-10-CM | POA: Diagnosis not present

## 2020-11-09 DIAGNOSIS — I69320 Aphasia following cerebral infarction: Secondary | ICD-10-CM | POA: Diagnosis not present

## 2020-11-09 DIAGNOSIS — D649 Anemia, unspecified: Secondary | ICD-10-CM | POA: Diagnosis not present

## 2020-11-09 DIAGNOSIS — E119 Type 2 diabetes mellitus without complications: Secondary | ICD-10-CM | POA: Diagnosis not present

## 2020-11-09 DIAGNOSIS — I6931 Attention and concentration deficit following cerebral infarction: Secondary | ICD-10-CM | POA: Diagnosis not present

## 2020-11-09 DIAGNOSIS — R000 Tachycardia, unspecified: Secondary | ICD-10-CM | POA: Diagnosis not present

## 2020-11-09 DIAGNOSIS — Z7401 Bed confinement status: Secondary | ICD-10-CM | POA: Diagnosis not present

## 2020-11-09 DIAGNOSIS — R339 Retention of urine, unspecified: Secondary | ICD-10-CM | POA: Diagnosis not present

## 2020-11-09 DIAGNOSIS — G8250 Quadriplegia, unspecified: Secondary | ICD-10-CM | POA: Diagnosis not present

## 2020-11-12 DIAGNOSIS — I69320 Aphasia following cerebral infarction: Secondary | ICD-10-CM | POA: Diagnosis not present

## 2020-11-12 DIAGNOSIS — G40909 Epilepsy, unspecified, not intractable, without status epilepticus: Secondary | ICD-10-CM | POA: Diagnosis not present

## 2020-11-12 DIAGNOSIS — R000 Tachycardia, unspecified: Secondary | ICD-10-CM | POA: Diagnosis not present

## 2020-11-12 DIAGNOSIS — I69365 Other paralytic syndrome following cerebral infarction, bilateral: Secondary | ICD-10-CM | POA: Diagnosis not present

## 2020-11-12 DIAGNOSIS — R339 Retention of urine, unspecified: Secondary | ICD-10-CM | POA: Diagnosis not present

## 2020-11-12 DIAGNOSIS — Z87440 Personal history of urinary (tract) infections: Secondary | ICD-10-CM | POA: Diagnosis not present

## 2020-11-12 DIAGNOSIS — I6931 Attention and concentration deficit following cerebral infarction: Secondary | ICD-10-CM | POA: Diagnosis not present

## 2020-11-12 DIAGNOSIS — Z7401 Bed confinement status: Secondary | ICD-10-CM | POA: Diagnosis not present

## 2020-11-12 DIAGNOSIS — G4733 Obstructive sleep apnea (adult) (pediatric): Secondary | ICD-10-CM | POA: Diagnosis not present

## 2020-11-12 DIAGNOSIS — G8250 Quadriplegia, unspecified: Secondary | ICD-10-CM | POA: Diagnosis not present

## 2020-11-12 DIAGNOSIS — E119 Type 2 diabetes mellitus without complications: Secondary | ICD-10-CM | POA: Diagnosis not present

## 2020-11-12 DIAGNOSIS — I69311 Memory deficit following cerebral infarction: Secondary | ICD-10-CM | POA: Diagnosis not present

## 2020-11-12 DIAGNOSIS — D649 Anemia, unspecified: Secondary | ICD-10-CM | POA: Diagnosis not present

## 2020-11-12 DIAGNOSIS — I10 Essential (primary) hypertension: Secondary | ICD-10-CM | POA: Diagnosis not present

## 2020-11-12 DIAGNOSIS — Z86718 Personal history of other venous thrombosis and embolism: Secondary | ICD-10-CM | POA: Diagnosis not present

## 2020-11-13 DIAGNOSIS — G8250 Quadriplegia, unspecified: Secondary | ICD-10-CM | POA: Diagnosis not present

## 2020-11-13 DIAGNOSIS — I10 Essential (primary) hypertension: Secondary | ICD-10-CM | POA: Diagnosis not present

## 2020-11-13 DIAGNOSIS — Z86718 Personal history of other venous thrombosis and embolism: Secondary | ICD-10-CM | POA: Diagnosis not present

## 2020-11-13 DIAGNOSIS — Z87440 Personal history of urinary (tract) infections: Secondary | ICD-10-CM | POA: Diagnosis not present

## 2020-11-13 DIAGNOSIS — R000 Tachycardia, unspecified: Secondary | ICD-10-CM | POA: Diagnosis not present

## 2020-11-13 DIAGNOSIS — E119 Type 2 diabetes mellitus without complications: Secondary | ICD-10-CM | POA: Diagnosis not present

## 2020-11-13 DIAGNOSIS — D649 Anemia, unspecified: Secondary | ICD-10-CM | POA: Diagnosis not present

## 2020-11-13 DIAGNOSIS — I69311 Memory deficit following cerebral infarction: Secondary | ICD-10-CM | POA: Diagnosis not present

## 2020-11-13 DIAGNOSIS — I6931 Attention and concentration deficit following cerebral infarction: Secondary | ICD-10-CM | POA: Diagnosis not present

## 2020-11-13 DIAGNOSIS — I69365 Other paralytic syndrome following cerebral infarction, bilateral: Secondary | ICD-10-CM | POA: Diagnosis not present

## 2020-11-13 DIAGNOSIS — I69320 Aphasia following cerebral infarction: Secondary | ICD-10-CM | POA: Diagnosis not present

## 2020-11-13 DIAGNOSIS — G4733 Obstructive sleep apnea (adult) (pediatric): Secondary | ICD-10-CM | POA: Diagnosis not present

## 2020-11-13 DIAGNOSIS — R339 Retention of urine, unspecified: Secondary | ICD-10-CM | POA: Diagnosis not present

## 2020-11-13 DIAGNOSIS — Z7401 Bed confinement status: Secondary | ICD-10-CM | POA: Diagnosis not present

## 2020-11-13 DIAGNOSIS — G40909 Epilepsy, unspecified, not intractable, without status epilepticus: Secondary | ICD-10-CM | POA: Diagnosis not present

## 2020-11-17 DIAGNOSIS — I69365 Other paralytic syndrome following cerebral infarction, bilateral: Secondary | ICD-10-CM | POA: Diagnosis not present

## 2020-11-17 DIAGNOSIS — Z87440 Personal history of urinary (tract) infections: Secondary | ICD-10-CM | POA: Diagnosis not present

## 2020-11-17 DIAGNOSIS — G8250 Quadriplegia, unspecified: Secondary | ICD-10-CM | POA: Diagnosis not present

## 2020-11-17 DIAGNOSIS — G4733 Obstructive sleep apnea (adult) (pediatric): Secondary | ICD-10-CM | POA: Diagnosis not present

## 2020-11-17 DIAGNOSIS — R339 Retention of urine, unspecified: Secondary | ICD-10-CM | POA: Diagnosis not present

## 2020-11-17 DIAGNOSIS — R000 Tachycardia, unspecified: Secondary | ICD-10-CM | POA: Diagnosis not present

## 2020-11-17 DIAGNOSIS — Z7401 Bed confinement status: Secondary | ICD-10-CM | POA: Diagnosis not present

## 2020-11-17 DIAGNOSIS — E119 Type 2 diabetes mellitus without complications: Secondary | ICD-10-CM | POA: Diagnosis not present

## 2020-11-17 DIAGNOSIS — I10 Essential (primary) hypertension: Secondary | ICD-10-CM | POA: Diagnosis not present

## 2020-11-17 DIAGNOSIS — G40909 Epilepsy, unspecified, not intractable, without status epilepticus: Secondary | ICD-10-CM | POA: Diagnosis not present

## 2020-11-17 DIAGNOSIS — I6931 Attention and concentration deficit following cerebral infarction: Secondary | ICD-10-CM | POA: Diagnosis not present

## 2020-11-17 DIAGNOSIS — D649 Anemia, unspecified: Secondary | ICD-10-CM | POA: Diagnosis not present

## 2020-11-17 DIAGNOSIS — I69320 Aphasia following cerebral infarction: Secondary | ICD-10-CM | POA: Diagnosis not present

## 2020-11-17 DIAGNOSIS — I69311 Memory deficit following cerebral infarction: Secondary | ICD-10-CM | POA: Diagnosis not present

## 2020-11-17 DIAGNOSIS — Z86718 Personal history of other venous thrombosis and embolism: Secondary | ICD-10-CM | POA: Diagnosis not present

## 2020-11-19 DIAGNOSIS — I69311 Memory deficit following cerebral infarction: Secondary | ICD-10-CM | POA: Diagnosis not present

## 2020-11-19 DIAGNOSIS — I6931 Attention and concentration deficit following cerebral infarction: Secondary | ICD-10-CM | POA: Diagnosis not present

## 2020-11-19 DIAGNOSIS — G4733 Obstructive sleep apnea (adult) (pediatric): Secondary | ICD-10-CM | POA: Diagnosis not present

## 2020-11-19 DIAGNOSIS — Z86718 Personal history of other venous thrombosis and embolism: Secondary | ICD-10-CM | POA: Diagnosis not present

## 2020-11-19 DIAGNOSIS — I69365 Other paralytic syndrome following cerebral infarction, bilateral: Secondary | ICD-10-CM | POA: Diagnosis not present

## 2020-11-19 DIAGNOSIS — R339 Retention of urine, unspecified: Secondary | ICD-10-CM | POA: Diagnosis not present

## 2020-11-19 DIAGNOSIS — G8250 Quadriplegia, unspecified: Secondary | ICD-10-CM | POA: Diagnosis not present

## 2020-11-19 DIAGNOSIS — I69320 Aphasia following cerebral infarction: Secondary | ICD-10-CM | POA: Diagnosis not present

## 2020-11-19 DIAGNOSIS — G40909 Epilepsy, unspecified, not intractable, without status epilepticus: Secondary | ICD-10-CM | POA: Diagnosis not present

## 2020-11-19 DIAGNOSIS — Z87440 Personal history of urinary (tract) infections: Secondary | ICD-10-CM | POA: Diagnosis not present

## 2020-11-19 DIAGNOSIS — I10 Essential (primary) hypertension: Secondary | ICD-10-CM | POA: Diagnosis not present

## 2020-11-19 DIAGNOSIS — D649 Anemia, unspecified: Secondary | ICD-10-CM | POA: Diagnosis not present

## 2020-11-19 DIAGNOSIS — R000 Tachycardia, unspecified: Secondary | ICD-10-CM | POA: Diagnosis not present

## 2020-11-19 DIAGNOSIS — Z7401 Bed confinement status: Secondary | ICD-10-CM | POA: Diagnosis not present

## 2020-11-19 DIAGNOSIS — E119 Type 2 diabetes mellitus without complications: Secondary | ICD-10-CM | POA: Diagnosis not present

## 2020-11-20 DIAGNOSIS — E119 Type 2 diabetes mellitus without complications: Secondary | ICD-10-CM | POA: Diagnosis not present

## 2020-11-20 DIAGNOSIS — Z7401 Bed confinement status: Secondary | ICD-10-CM | POA: Diagnosis not present

## 2020-11-20 DIAGNOSIS — G40909 Epilepsy, unspecified, not intractable, without status epilepticus: Secondary | ICD-10-CM | POA: Diagnosis not present

## 2020-11-20 DIAGNOSIS — I69365 Other paralytic syndrome following cerebral infarction, bilateral: Secondary | ICD-10-CM | POA: Diagnosis not present

## 2020-11-20 DIAGNOSIS — G4733 Obstructive sleep apnea (adult) (pediatric): Secondary | ICD-10-CM | POA: Diagnosis not present

## 2020-11-20 DIAGNOSIS — G8250 Quadriplegia, unspecified: Secondary | ICD-10-CM | POA: Diagnosis not present

## 2020-11-20 DIAGNOSIS — R000 Tachycardia, unspecified: Secondary | ICD-10-CM | POA: Diagnosis not present

## 2020-11-20 DIAGNOSIS — R339 Retention of urine, unspecified: Secondary | ICD-10-CM | POA: Diagnosis not present

## 2020-11-20 DIAGNOSIS — D649 Anemia, unspecified: Secondary | ICD-10-CM | POA: Diagnosis not present

## 2020-11-20 DIAGNOSIS — I69320 Aphasia following cerebral infarction: Secondary | ICD-10-CM | POA: Diagnosis not present

## 2020-11-20 DIAGNOSIS — I69311 Memory deficit following cerebral infarction: Secondary | ICD-10-CM | POA: Diagnosis not present

## 2020-11-20 DIAGNOSIS — Z86718 Personal history of other venous thrombosis and embolism: Secondary | ICD-10-CM | POA: Diagnosis not present

## 2020-11-20 DIAGNOSIS — Z87440 Personal history of urinary (tract) infections: Secondary | ICD-10-CM | POA: Diagnosis not present

## 2020-11-20 DIAGNOSIS — I6931 Attention and concentration deficit following cerebral infarction: Secondary | ICD-10-CM | POA: Diagnosis not present

## 2020-11-20 DIAGNOSIS — I10 Essential (primary) hypertension: Secondary | ICD-10-CM | POA: Diagnosis not present

## 2020-11-24 DIAGNOSIS — G8250 Quadriplegia, unspecified: Secondary | ICD-10-CM | POA: Diagnosis not present

## 2020-11-24 DIAGNOSIS — I69320 Aphasia following cerebral infarction: Secondary | ICD-10-CM | POA: Diagnosis not present

## 2020-11-24 DIAGNOSIS — R339 Retention of urine, unspecified: Secondary | ICD-10-CM | POA: Diagnosis not present

## 2020-11-24 DIAGNOSIS — I6931 Attention and concentration deficit following cerebral infarction: Secondary | ICD-10-CM | POA: Diagnosis not present

## 2020-11-24 DIAGNOSIS — Z86718 Personal history of other venous thrombosis and embolism: Secondary | ICD-10-CM | POA: Diagnosis not present

## 2020-11-24 DIAGNOSIS — Z87440 Personal history of urinary (tract) infections: Secondary | ICD-10-CM | POA: Diagnosis not present

## 2020-11-24 DIAGNOSIS — I10 Essential (primary) hypertension: Secondary | ICD-10-CM | POA: Diagnosis not present

## 2020-11-24 DIAGNOSIS — G40909 Epilepsy, unspecified, not intractable, without status epilepticus: Secondary | ICD-10-CM | POA: Diagnosis not present

## 2020-11-24 DIAGNOSIS — I69311 Memory deficit following cerebral infarction: Secondary | ICD-10-CM | POA: Diagnosis not present

## 2020-11-24 DIAGNOSIS — I69365 Other paralytic syndrome following cerebral infarction, bilateral: Secondary | ICD-10-CM | POA: Diagnosis not present

## 2020-11-24 DIAGNOSIS — G4733 Obstructive sleep apnea (adult) (pediatric): Secondary | ICD-10-CM | POA: Diagnosis not present

## 2020-11-24 DIAGNOSIS — R000 Tachycardia, unspecified: Secondary | ICD-10-CM | POA: Diagnosis not present

## 2020-11-24 DIAGNOSIS — E119 Type 2 diabetes mellitus without complications: Secondary | ICD-10-CM | POA: Diagnosis not present

## 2020-11-24 DIAGNOSIS — D649 Anemia, unspecified: Secondary | ICD-10-CM | POA: Diagnosis not present

## 2020-11-24 DIAGNOSIS — Z7401 Bed confinement status: Secondary | ICD-10-CM | POA: Diagnosis not present

## 2020-12-02 DIAGNOSIS — L8994 Pressure ulcer of unspecified site, stage 4: Secondary | ICD-10-CM | POA: Diagnosis not present

## 2020-12-02 DIAGNOSIS — I6389 Other cerebral infarction: Secondary | ICD-10-CM | POA: Diagnosis not present

## 2020-12-02 DIAGNOSIS — G4733 Obstructive sleep apnea (adult) (pediatric): Secondary | ICD-10-CM | POA: Diagnosis not present

## 2020-12-03 DIAGNOSIS — G40909 Epilepsy, unspecified, not intractable, without status epilepticus: Secondary | ICD-10-CM | POA: Diagnosis not present

## 2020-12-03 DIAGNOSIS — I69365 Other paralytic syndrome following cerebral infarction, bilateral: Secondary | ICD-10-CM | POA: Diagnosis not present

## 2020-12-03 DIAGNOSIS — G8250 Quadriplegia, unspecified: Secondary | ICD-10-CM | POA: Diagnosis not present

## 2020-12-15 ENCOUNTER — Ambulatory Visit: Payer: BC Managed Care – PPO | Attending: Physician Assistant | Admitting: Speech Pathology

## 2020-12-15 ENCOUNTER — Ambulatory Visit: Payer: BC Managed Care – PPO | Admitting: Occupational Therapy

## 2020-12-15 ENCOUNTER — Other Ambulatory Visit: Payer: Self-pay

## 2020-12-15 ENCOUNTER — Encounter: Payer: Self-pay | Admitting: Occupational Therapy

## 2020-12-15 DIAGNOSIS — R278 Other lack of coordination: Secondary | ICD-10-CM | POA: Insufficient documentation

## 2020-12-15 DIAGNOSIS — M6281 Muscle weakness (generalized): Secondary | ICD-10-CM | POA: Diagnosis not present

## 2020-12-15 DIAGNOSIS — I6930 Unspecified sequelae of cerebral infarction: Secondary | ICD-10-CM | POA: Insufficient documentation

## 2020-12-15 DIAGNOSIS — R41841 Cognitive communication deficit: Secondary | ICD-10-CM | POA: Diagnosis not present

## 2020-12-15 DIAGNOSIS — R471 Dysarthria and anarthria: Secondary | ICD-10-CM | POA: Insufficient documentation

## 2020-12-15 NOTE — Therapy (Signed)
[facility] MAIN [facility] SERVICES [address] [place], [place], [address] Phone: [phone]   Fax:  [phone]  Occupational Therapy Evaluation  Patient Details  Name: Paul Hall MRN: [id number] Date of Birth: [birth year] Referring Provider (OT): Lenise Herald   Encounter Date: 12/15/2020   OT End of Session - 12/15/20 1414     Visit Number 1    Number of Visits 24    Date for OT Re-Evaluation 03/09/21    Authorization Type Progress report period starting 12/15/2020    OT Start Time 1105    OT Stop Time 1205    OT Time Calculation (min) 60 min    Activity Tolerance Patient tolerated treatment well    Behavior During Therapy [facility] for tasks assessed/performed             Past Medical History:  Diagnosis Date   Diabetes mellitus (HCC)    Hypertension    Stroke [facility])     Past Surgical History:  Procedure Laterality Date   IVC FILTER PLACEMENT ([facility] HX)     LEG SURGERY     PEG TUBE PLACEMENT     TRACHEOSTOMY      There were no vitals filed for this visit.   Subjective Assessment - 12/15/20 1406     Subjective  Pt. was present with his mother, and father.    Patient is accompanied by: Family member    Pertinent History Pt. is a 25 y.o. male who was hospitalized, and diagnosed with COVID-19, sustained a CVA with resultant quadreplegia in 01/2019. Pt. was hospitalized with VRE  UTI. Pt. PMHx includes: urinary retention, Seizure Disorder, Obstructive Sleep Disorder, DM Type II, and Morbid obesity. Pt. resides at home with his parents.    Limitations BUE, and LE limitations with ROM, strength, motor control, and [facility] skills.    Patient Stated Goals To improve his arms, and hands.    Currently in Pain? No/denies               [facility] OT Assessment - 12/15/20 1109       Assessment   Medical Diagnosis CVA/COVID    Referring Provider (OT) Lenise Herald    Onset Date/Surgical Date 01/08/19    Hand Dominance Right       Precautions   Precautions None      Balance Screen   Has the patient fallen in the past 6 months No      Home  Environment   Family/patient expects to be discharged to: Private residence    Type of Home House    Home Access Level entry    Bathroom Shower/Tub Tub/Shower unit    Shower/tub characteristics Curtain    Home Equipment Wheelchair - power   [place] lift, and sit to stand   Lives With [employer], and brother     Prior Function   Level of Independence Independent    Vocation --   Pensions consultant, amateur wrestling, music, watching televison      ADL   Eating/Feeding Maximal assistance    Grooming Maximal assistance    Upper Body Bathing + 1 Total asssestance   Sponge Bath   Lower Body Bathing + 1 Total assistance    Upper Body Dressing + 1 Total assistance    Lower Body Dressing +1 Total aassistance    Toilet Transfer + 1 Total assistance      IADL   Prior Level of  Function Shopping Independent    Shopping Completely unable to shop    Prior Level of Function Light Housekeeping Independent    Light Housekeeping Needs help with all home maintenance tasks    Prior Level of Function Meal Prep Independent    Meal Prep Needs to have meals prepared and served    Prior Level of Function Best boy Relies on family or friends for transportation    Prior Level of Function Medication Managment Independent    Medication Management Is not capable of dispensing or managing own medication    Prior Level of Function Financial Management Independent    Financial Management Dependent      Mobility   Mobility Status Needs assist      Written Expression   Dominant Hand Right      Vision - History   Baseline Vision Wears glasses all the time    Patient Visual Report --   Pt. is able to see outlines of shapes, unable to see details, and has difficulty with depth perception.     Observation/Other  Assessments   Focus on Therapeutic Outcomes (FOTO)  41      Sensation   Light Touch Appears Intact    Proprioception Impaired by gross assessment      Coordination   Gross Motor Movements are Fluid and Coordinated No    Fine Motor Movements are Fluid and Coordinated No      AROM   Overall AROM Comments RUE shoulder flexion: 62(94), abduction: 76(93), elbow flexion 135(140), elbow extension -50(-40), wrist extension -15(25) LUE: shoulder flexion 114(120), abduction 104, elbow flexion 135, elbow extension: -35, wrist flexion: 30, wrist extension: 15      Hand Function   Comment digit flexion to [facility]: R: 2nd: 8cm, 3rd: 7cm, 4th: 6cm, 5th: 3cm; LUE: 2nd: 7cm, 3rd: 5cm, 4th: 5cm, 5th: 4cm                              OT Education - 12/15/20 1405     Education Details OT services, POC, and goals    Person(s) Educated Patient    Methods Explanation    Comprehension Verbalized understanding                 OT Long Term Goals - 12/15/20 1546       OT LONG TERM GOAL #1   Title Pt. will improve FOTO score by 2 points for clinically significant improvements during ADLs, and IADLs,    Baseline Eval: FOTO score: 41    Time 12    Period Weeks    Status New    Target Date 03/09/21      OT LONG TERM GOAL #2   Title Pt. will improve right shoulder flexion by 10 degrees to assist caregivers with UE dressing    Baseline Eval: R: 62(94), L: 114(120    Time 12    Period Weeks    Status New    Target Date 03/09/21      OT LONG TERM GOAL #3   Title Pt. will improve right Abduction by 10 degrees to assist caregivers with underarm hygiene care.    Baseline Eval: R: 76(93)    Time 12    Period Weeks    Status New    Target Date 03/09/21      OT LONG TERM GOAL #4   Title Pt. will demonstrate visual compensatory  strategies for navigating through his environment.    Baseline Eval: Limited    Time 12    Period Weeks    Status New    Target Date 03/09/21       OT LONG TERM GOAL #5   Title Pt. will demonstrate visual compensatory strategies for tabletop tasks within his near space.    Baseline Eval: Pt. has difficulty    Time 12    Period Weeks    Status New    Target Date 03/09/21      OT LONG TERM GOAL #6   Title Pt. will perform hand to mouth patterns with min A in preparation for self-feeding.    Baseline Eval: Pt. has difficulty    Time 12    Period Weeks    Status New    Target Date 03/09/21      OT LONG TERM GOAL #7   Title Pt. will perform one step self-grooming tasks with minA.    Baseline Eval: pt. has difficulty    Time 12    Period Weeks    Status New    Target Date 03/09/21                   Plan - 12/15/20 1415     Clinical Impression Statement Pt. is a 25 y.o. male who was diagnosed with COVID-19 in 01/2019, sustained a CVA with resultant quadriplegia, and aphasia. In 06/2020  Pt. was hospitalized with VRE UTI. Pt. presents with decreased bilateral UE ROM limiting functional reaching. Pt. presents with increased tightness in bilateral hands limiting full digit extension. Pt. is Total Assist for ADL, and IADL functioning. Pt.'s FOTO score is 41. Pt. presents wtih low vision, and has plans to follow-up with a neuro-opthalmologist. Pt. currently has hand splints. Pt. will benefit from OT services to provide pt./caregiver education about visual compensatory strategies during ADLs/IADLs, to work on normalizing tone, and increasing BUE ROM, and strength in order to work towards improving, and maximizing independence with ADLs, and IADL tasks.    OT Occupational Profile and History Detailed Assessment- Review of Records and additional review of physical, cognitive, psychosocial history related to current functional performance    Occupational performance deficits (Please refer to evaluation for details): ADL's;IADL's    Body Structure / Function / Physical Skills ADL;FMC;ROM;Dexterity;IADL;Endurance;Obesity;Strength;UE  functional use    Rehab Potential Good    Clinical Decision Making Multiple treatment options, significant modification of task necessary    Comorbidities Affecting Occupational Performance: Presence of comorbidities impacting occupational performance    Modification or Assistance to Complete Evaluation  Max significant modification of tasks or assist is necessary to complete    OT Frequency 2x / week    OT Duration 12 weeks    OT Treatment/Interventions Self-care/ADL training;Therapeutic exercise;Neuromuscular education;Visual/perceptual remediation/compensation;Patient/family education;Therapeutic activities;DME and/or AE instruction;Passive range of motion;Moist Heat    Consulted and Agree with Plan of Care Patient             Patient will benefit from skilled therapeutic intervention in order to improve the following deficits and impairments:   Body Structure / Function / Physical Skills: ADL, FMC, ROM, Dexterity, IADL, Endurance, Obesity, Strength, UE functional use       Visit Diagnosis: Muscle weakness (generalized) - Plan: Ot plan of care cert/re-cert  Other lack of coordination - Plan: Ot plan of care cert/re-cert    Problem List Patient Active Problem List   Diagnosis Date Noted   Sepsis due to vancomycin  resistant Enterococcus species (HCC) 06/06/2019   SIRS (systemic inflammatory response syndrome) (HCC) 06/05/2019   Acute lower UTI 06/05/2019   VRE (vancomycin-resistant Enterococci) infection 06/05/2019   Anemia 06/05/2019   Skin ulcer of sacrum with necrosis of muscle (HCC)    Urinary retention    Type 2 diabetes mellitus without complication, with long-term current use of insulin (HCC)    Tachycardia    Lower extremity edema    Acute metabolic encephalopathy    Obstructive sleep apnea    Morbid obesity with BMI of 60.0-69.9, adult (HCC)    Goals of care, counseling/discussion    Palliative care encounter    Sepsis (HCC) 04/27/2019   H/O insulin dependent  diabetes mellitus 04/27/2019   History of CVA with residual deficit 04/27/2019   Seizure disorder (HCC) 04/27/2019   Decubitus ulcer of sacral region, stage 4 (HCC) 04/27/2019    Olegario Messier, MS, OTR/L 12/15/2020, 5:17 PM  [facility] MAIN [facility] SERVICES [address] [place], [place], [address] Phone: [phone]   Fax:  [phone]  Name: Paul Hall MRN: [id number] Date of Birth: Aug 14, 1995

## 2020-12-16 NOTE — Therapy (Signed)
[facility] MAIN [facility] SERVICES [address] [place], [place], [address] Phone: [phone]   Fax:  [phone]  Speech Language Pathology Evaluation  Patient Details  Name: Paul Hall MRN: [id number] Date of Birth: [birth year] Referring Provider (SLP): Lenise Herald   Encounter Date: 12/15/2020   End of Session - 12/16/20 1146     Visit Number 1    Number of Visits 25    Date for SLP Re-Evaluation 03/09/21    Authorization Type [employer] [place] [employer] PPO    Authorization Time Period 12/15/2020 thru 16/09/2020    Authorization - Visit Number 1    Authorization - Number of Visits 12    Progress Note Due on Visit 10    SLP Start Time 1000    SLP Stop Time  1100    SLP Time Calculation (min) 60 min    Activity Tolerance Patient tolerated treatment well             Past Medical History:  Diagnosis Date   Diabetes mellitus (HCC)    Hypertension    Stroke [facility])     Past Surgical History:  Procedure Laterality Date   IVC FILTER PLACEMENT ([facility] HX)     LEG SURGERY     PEG TUBE PLACEMENT     TRACHEOSTOMY      There were no vitals filed for this visit.   Subjective Assessment - 12/16/20 1136     Subjective pt pleasant, arrived in electric wheelchair with his mother and father    Patient is accompained by: Family member    Currently in Pain? No/denies                SLP Evaluation [facility] - 12/16/20 1136       SLP Visit Information   SLP Received On 12/15/20    Referring Provider (SLP) Lenise Herald    Onset Date 01/08/2019    Medical Diagnosis CVA      General Information   HPI Paul Hall is an 25 y.o. male with medical history of COVID-19 infection (01/20/2019) complicated by ARDS requiring ECMO. Pt experienced respiratory failure, failure to wean from ventilator requiring tracheostomy, PEG placement as well as several strokes leading to quadriplegia. CT head revealed intraparenchymal  hemorrhage in the left frontal lobe and in the left posterior corona radiata, persistent crowding of the basilar cisterns on the left, large left cerebral territorial infarct involving the left frontal lobe, left parietal lobe, and left occipital lobe (left ACA and PCA territories) as well as thick left cerebral convexity subdural hematoma with unchanged appearance of higher density material within the subdural hematoma posteriorly suggestive of intermittent bleeding.    Behavioral/Cognition appropriate    Mobility Status wheelchair bound      Prior Functional Status   Cognitive/Linguistic Baseline Within functional limits    Type of Home House     Lives With Family    Available Support Family    Vocation Full time employment      Cognition   Overall Cognitive Status Impaired/Different from baseline    Area of Impairment Orientation;Attention;Memory;Following commands;Problem solving    Orientation Level --   Date   Current Attention Level Sustained    Memory Decreased short-term memory    Following Commands Follows one step commands consistently    Problem Solving Slow processing;Difficulty sequencing;Requires verbal cues;Requires tactile cues    Attention Selective    Selective Attention Impaired    Selective Attention Impairment  Verbal basic;Functional basic    Memory Impaired    Memory Impairment Decreased recall of new information;Decreased short term memory    Decreased Short Term Memory Verbal complex;Functional complex    Awareness Appears intact    Problem Solving Impaired    Problem Solving Impairment Verbal complex   likely also related to physical weakness as well as neuro-optical deficits     Auditory Comprehension   Overall Auditory Comprehension Impaired    Yes/No Questions Impaired    Basic Biographical Questions 76-100% accurate   pt ad to self-correct several times to achieve 90% accuracy   Basic Immediate Environment Questions 75-100% accurate    Complex Questions  0-24% accurate    Commands Impaired    One Step Basic Commands 75-100% accurate    Two Step Basic Commands 50-74% accurate    Conversation Simple    Interfering Components Visual impairments    EffectiveTechniques Repetition      Visual Recognition/Discrimination   Discrimination Not tested      Reading Comprehension   Reading Status Unable to assess (comment)   d/t vision deficits     Expression   Primary Mode of Expression Verbal      Verbal Expression   Overall Verbal Expression Impaired    Initiation No impairment    Automatic Speech Name;Social Response;Day of week;Month of year    Level of Generative/Spontaneous Verbalization Conversation    Repetition No impairment    Naming Impairment    Responsive 76-100% accurate    Confrontation Not tested   d/t vision deficits   Convergent 25-49% accurate    Divergent 25-49% accurate    Pragmatics No impairment    Non-Verbal Means of Communication Not applicable      Written Expression   Dominant Hand Right    Written Expression Unable to assess (comment)   BUE weakness     Oral Motor/Sensory Function   Overall Oral Motor/Sensory Function Appears within functional limits for tasks assessed      Motor Speech   Overall Motor Speech Impaired    Respiration Impaired    Level of Impairment Phrase    Phonation Low vocal intensity    Resonance Within functional limits    Articulation Within functional limitis    Intelligibility Intelligibility reduced    Word 75-100% accurate    Phrase 75-100% accurate    Sentence 50-74% accurate   with mildy noisy environment   Conversation 50-74% accurate   within mildly noisy environment   Motor Planning Witnin functional limits    Motor Speech Errors Not applicable    Effective Techniques Increased vocal intensity    Phonation [facility]                SLP Education - 12/16/20 1145     Education Details results of cognitive linguistic assessment, ST POC    Person(s) Educated  Patient;Parent(s)    Methods Explanation;Demonstration    Comprehension Verbalized understanding;Need further instruction              SLP Short Term Goals - 12/16/20 1151       SLP SHORT TERM GOAL #1   Title With moderate cues, pt will increase vocal intensity to achieve > 95% speech intelligibility at the sentence level in a mildly noisey environment.    Baseline < 50%    Time 10    Period --   sessions   Status New      SLP SHORT TERM GOAL #2   Title  Pt will recall 2 of his current medicines and their function across 6 out of 10 session.    Baseline unable    Time 10    Period --   sessions   Status New      SLP SHORT TERM GOAL #3   Title After listening to short story, pt will answer [facility] questions with 75% accuracy.    Baseline 67%    Time 10    Period --   sessions   Status New      SLP SHORT TERM GOAL #4   Title With moderate cues, pt will perform basic mental manipulation activities with 75% accuracy.    Baseline < 25% with maximal assistance    Time 10    Period --   sessions   Status New              SLP Long Term Goals - 12/16/20 1156       SLP LONG TERM GOAL #1   Title Pt will utilize speech intelligibility strategies to achieve > 95% intelligibility at the sentence level in a mildly noisy environment.    Baseline <75%    Time 12    Period Weeks    Status New    Target Date 03/09/21      SLP LONG TERM GOAL #2   Title Pt will recall list of his current medications with 90% accuracy.    Baseline unable    Time 12    Period Weeks    Status New    Target Date 03/09/21              Plan - 12/16/20 1147     Clinical Impression Statement Pt presents with moderate cognitive communication deficits that are further complicated by deficits in vision and bilateral upper extremity weakness. As such, pt is not able to see printed material for distinguish facial features and he is not able to hold pen/pencil. To accommodate for these impairments,  an informal cognitive evaluation was administered.   Pt is oriented to month, DOW, year, president, pt's address and current situation. Pt's abilities and deficits are reflected below:   Basic yes/no questions - 100%   Factual complex yes/no questions - 60%  Auditory Paragraph Comprehension - 67%  Following 1-step directions - 100%   Following 2 step directions - 50%  Responsive naming - 100%   Generative naming - animals 13; /m/ words 4; /a/ words 2   Digits Forward - accurate thru 6 digits  Digits Backward - accurate thru 2 digits  Selective Attention to number - 100%  Recall of paragraph information - 9 of 25 ideas    Subjectively, pt is not able to recall his medicines or their function and while his articulation appears precise, his speech intelligibility is reduced in a noisy environment d/t decreased vocal intensity.     Prior to COVID and pt's CVAs, he lived independently with his wife. He is currently living with and is dependent on his parents. Skilled ST intervention is required to target these deficits to increase pt's functional independence and reduce caregiver burden.    Of note, pt and his family report that pt has been decannulated for almost 1.5 years and that his PCP progressed pt's diet to regular with thin liquids almost 1.5 years as well. No history of respiratory decline noted.     Speech Therapy Frequency 2x / week    Duration 12 weeks    Treatment/Interventions Language facilitation;Cueing hierarchy;Internal/external  aids;Functional tasks;SLP instruction and feedback;Patient/family education;Cognitive reorganization;Compensatory strategies    Potential to Achieve Goals Fair    Potential Considerations Severity of impairments;Other (comment);Co-morbidities   time post CVA   Consulted and Agree with Plan of Care Patient;Family member/caregiver    Family Member Consulted pt's mom and dad             Patient will benefit from skilled therapeutic intervention in  order to improve the following deficits and impairments:   Cognitive communication deficit  Dysarthria and anarthria  History of CVA with residual deficit    Problem List Patient Active Problem List   Diagnosis Date Noted   Sepsis due to vancomycin resistant Enterococcus species (HCC) 06/06/2019   SIRS (systemic inflammatory response syndrome) (HCC) 06/05/2019   Acute lower UTI 06/05/2019   VRE (vancomycin-resistant Enterococci) infection 06/05/2019   Anemia 06/05/2019   Skin ulcer of sacrum with necrosis of muscle (HCC)    Urinary retention    Type 2 diabetes mellitus without complication, with long-term current use of insulin (HCC)    Tachycardia    Lower extremity edema    Acute metabolic encephalopathy    Obstructive sleep apnea    Morbid obesity with BMI of 60.0-69.9, adult (HCC)    Goals of care, counseling/discussion    Palliative care encounter    Sepsis (HCC) 04/27/2019   H/O insulin dependent diabetes mellitus 04/27/2019   History of CVA with residual deficit 04/27/2019   Seizure disorder (HCC) 04/27/2019   Decubitus ulcer of sacral region, stage 4 (HCC) 04/27/2019   Juanangel Soderholm B. Dreama Saa M.S., CCC-SLP, [facility] Speech-Language Pathologist Rehabilitation Services Office [phone]  Reuel Derby 12/16/2020, 12:00 PM  [facility] MAIN [facility] SERVICES [address] [place], [place], [address] Phone: [phone]   Fax:  [phone]  Name: Paul Hall MRN: [id number] Date of Birth: 01/13/1996

## 2020-12-21 ENCOUNTER — Ambulatory Visit: Payer: BC Managed Care – PPO

## 2020-12-21 ENCOUNTER — Ambulatory Visit: Payer: BC Managed Care – PPO | Admitting: Speech Pathology

## 2020-12-22 DIAGNOSIS — G4733 Obstructive sleep apnea (adult) (pediatric): Secondary | ICD-10-CM | POA: Diagnosis not present

## 2020-12-23 ENCOUNTER — Encounter: Payer: Self-pay | Admitting: Occupational Therapy

## 2020-12-23 ENCOUNTER — Ambulatory Visit: Payer: BC Managed Care – PPO | Admitting: Speech Pathology

## 2020-12-23 ENCOUNTER — Ambulatory Visit: Payer: BC Managed Care – PPO | Admitting: Occupational Therapy

## 2020-12-23 ENCOUNTER — Other Ambulatory Visit: Payer: Self-pay

## 2020-12-23 DIAGNOSIS — I6930 Unspecified sequelae of cerebral infarction: Secondary | ICD-10-CM

## 2020-12-23 DIAGNOSIS — R41841 Cognitive communication deficit: Secondary | ICD-10-CM

## 2020-12-23 DIAGNOSIS — M6281 Muscle weakness (generalized): Secondary | ICD-10-CM

## 2020-12-23 DIAGNOSIS — R278 Other lack of coordination: Secondary | ICD-10-CM | POA: Diagnosis not present

## 2020-12-23 DIAGNOSIS — R471 Dysarthria and anarthria: Secondary | ICD-10-CM | POA: Diagnosis not present

## 2020-12-23 NOTE — Therapy (Signed)
[facility] MAIN [facility] SERVICES [address] [place], [place], [address] Phone: [phone]   Fax:  [phone]  Occupational Therapy Treatment  Patient Details  Name: Paul Hall MRN: [id number] Date of Birth: [birth year] Referring Provider (OT): Lenise Herald   Encounter Date: 12/23/2020   OT End of Session - 12/23/20 1715     Visit Number 2    Number of Visits 24    Date for OT Re-Evaluation 03/09/21    Authorization Type Progress report period starting 12/15/2020    OT Start Time 1517    OT Stop Time 1600    OT Time Calculation (min) 43 min    Activity Tolerance Patient tolerated treatment well    Behavior During Therapy [facility] for tasks assessed/performed             Past Medical History:  Diagnosis Date   Diabetes mellitus (HCC)    Hypertension    Stroke [facility])     Past Surgical History:  Procedure Laterality Date   IVC FILTER PLACEMENT ([facility] HX)     LEG SURGERY     PEG TUBE PLACEMENT     TRACHEOSTOMY      There were no vitals filed for this visit.   Subjective Assessment - 12/23/20 1714     Subjective  Pt. was present with his mother, and father.    Patient is accompanied by: Family member    Pertinent History Pt. is a 25 y.o. male who was hospitalized, and diagnosed with COVID-19, sustained a CVA with resultant quadreplegia in 01/2019. Pt. was hospitalized with VRE  UTI. Pt. PMHx includes: urinary retention, Seizure Disorder, Obstructive Sleep Disorder, DM Type II, and Morbid obesity. Pt. resides at home with his parents.    Patient Stated Goals To improve his arms, and hands.    Currently in Pain? No/denies   Reports numbness through his bottom from prolonged seating in the chair.           OT TREATMENT    Therapeutic Exercise:  Patient tolerated active range of motion reps for bilateral shoulder flexion abduction.  Passive range of motion for bilateral elbow flexion and extension with slow prolonged  gentle stretching for elbow extension, wrist extension, and digit extension.  Patient tolerated moist heat modality to the bilateral hands prior to range of motion.  Patient education was provided about positioning of the bilateral upper extremities.  Patient and caregiver education was provided about finger stretches to prepare the hand for range of motion and repair of the hands for placement in a splint.  Patient reports no pain, however reports numbness through the sacral region from being in the chair for therapy.  Patient reports typically being in his chair for no longer than an hour at a time at home.  Patient tolerated range of motion, and stretching to his bilateral elbows, wrists, and digits.  Patient is able to actively extend the MP joints, however maintains the PIPs and DIPs flexion.  Patient education was provided about positioning, and range of motion.  Pt. will plan to introduce a universal cuff with patient at the next session.  Patient continues to work on normalizing tone in the bilateral upper extremities in order to improve range of motion and facilitate active range of motion in order to increase engagement in ADLs and IADL tasks.                        OT Education -  12/23/20 1715     Education Details OT services, POC, and goals    Person(s) Educated Patient    Methods Explanation    Comprehension Verbalized understanding                 OT Long Term Goals - 12/15/20 1546       OT LONG TERM GOAL #1   Title Pt. will improve FOTO score by 2 points for clinically significant improvements during ADLs, and IADLs,    Baseline Eval: FOTO score: 41    Time 12    Period Weeks    Status New    Target Date 03/09/21      OT LONG TERM GOAL #2   Title Pt. will improve right shoulder flexion by 10 degrees to assist caregivers with UE dressing    Baseline Eval: R: 62(94), L: 114(120    Time 12    Period Weeks    Status New    Target Date 03/09/21       OT LONG TERM GOAL #3   Title Pt. will improve right Abduction by 10 degrees to assist caregivers with underarm hygiene care.    Baseline Eval: R: 76(93)    Time 12    Period Weeks    Status New    Target Date 03/09/21      OT LONG TERM GOAL #4   Title Pt. will demonstrate visual compensatory strategies for navigating through his environment.    Baseline Eval: Limited    Time 12    Period Weeks    Status New    Target Date 03/09/21      OT LONG TERM GOAL #5   Title Pt. will demonstrate visual compensatory strategies for tabletop tasks within his near space.    Baseline Eval: Pt. has difficulty    Time 12    Period Weeks    Status New    Target Date 03/09/21      OT LONG TERM GOAL #6   Title Pt. will perform hand to mouth patterns with min A in preparation for self-feeding.    Baseline Eval: Pt. has difficulty    Time 12    Period Weeks    Status New    Target Date 03/09/21      OT LONG TERM GOAL #7   Title Pt. will perform one step self-grooming tasks with minA.    Baseline Eval: pt. has difficulty    Time 12    Period Weeks    Status New    Target Date 03/09/21                   Plan - 12/23/20 1716     Clinical Impression Statement Patient reports no pain, however reports numbness through the sacral region from being in the chair for therapy.  Patient reports typically being in his chair for no longer than an hour at a time at home.  Patient tolerated range of motion, and stretching to his bilateral elbows, wrists, and digits.  Patient is able to actively extend the MP joints, however maintains the PIPs and DIPs flexion.  Patient education was provided about positioning, and range of motion.  Pt. will plan to introduce a universal cuff with patient at the next session.  Patient continues to work on normalizing tone in the bilateral upper extremities in order to improve range of motion and facilitate active range of motion in order to increase engagement in  ADLs and IADL  tasks.   OT Occupational Profile and History Detailed Assessment- Review of Records and additional review of physical, cognitive, psychosocial history related to current functional performance    Occupational performance deficits (Please refer to evaluation for details): ADL's;IADL's    Body Structure / Function / Physical Skills ADL;FMC;ROM;Dexterity;IADL;Endurance;Obesity;Strength;UE functional use    Rehab Potential Good    Clinical Decision Making Multiple treatment options, significant modification of task necessary    Comorbidities Affecting Occupational Performance: Presence of comorbidities impacting occupational performance    Modification or Assistance to Complete Evaluation  Max significant modification of tasks or assist is necessary to complete    OT Frequency 2x / week    OT Duration 12 weeks    OT Treatment/Interventions Self-care/ADL training;Therapeutic exercise;Neuromuscular education;Visual/perceptual remediation/compensation;Patient/family education;Therapeutic activities;DME and/or AE instruction;Passive range of motion;Moist Heat    Consulted and Agree with Plan of Care Patient             Patient will benefit from skilled therapeutic intervention in order to improve the following deficits and impairments:   Body Structure / Function / Physical Skills: ADL, FMC, ROM, Dexterity, IADL, Endurance, Obesity, Strength, UE functional use       Visit Diagnosis: Muscle weakness (generalized)    Problem List Patient Active Problem List   Diagnosis Date Noted   Sepsis due to vancomycin resistant Enterococcus species (HCC) 06/06/2019   SIRS (systemic inflammatory response syndrome) (HCC) 06/05/2019   Acute lower UTI 06/05/2019   VRE (vancomycin-resistant Enterococci) infection 06/05/2019   Anemia 06/05/2019   Skin ulcer of sacrum with necrosis of muscle (HCC)    Urinary retention    Type 2 diabetes mellitus without complication, with long-term current  use of insulin (HCC)    Tachycardia    Lower extremity edema    Acute metabolic encephalopathy    Obstructive sleep apnea    Morbid obesity with BMI of 60.0-69.9, adult (HCC)    Goals of care, counseling/discussion    Palliative care encounter    Sepsis (HCC) 04/27/2019   H/O insulin dependent diabetes mellitus 04/27/2019   History of CVA with residual deficit 04/27/2019   Seizure disorder (HCC) 04/27/2019   Decubitus ulcer of sacral region, stage 4 (HCC) 04/27/2019    Olegario Messier, MS, OTR/L 12/23/2020, 5:20 PM  [facility] MAIN [facility] SERVICES [address] [place], [place], [address] Phone: [phone]   Fax:  [phone]  Name: David Rodriquez MRN: [id number] Date of Birth: 1995/10/31

## 2020-12-24 NOTE — Therapy (Signed)
[facility] MAIN [facility] SERVICES [address] [place], [place], [address] Phone: [phone]   Fax:  [phone]  Speech Language Pathology Treatment  Patient Details  Name: Paul Hall MRN: [id number] Date of Birth: 12/25/1995 Referring Provider (SLP): Lenise Herald   Encounter Date: 12/23/2020   End of Session - 12/24/20 1127     Visit Number 2    Number of Visits 25    Date for SLP Re-Evaluation 03/09/21    Authorization Type [employer] P[employer]    Authorization Time Period 12/15/2020 thru 16/09/2020    Authorization - Visit Number 2    Authorization - Number of Visits 12    Progress Note Due on Visit 10    SLP Start Time 1405    SLP Stop Time  1500    SLP Time Calculation (min) 55 min    Activity Tolerance Patient tolerated treatment well             Past Medical History:  Diagnosis Date   Diabetes mellitus (HCC)    Hypertension    Stroke [facility])     Past Surgical History:  Procedure Laterality Date   IVC FILTER PLACEMENT ([facility] HX)     LEG SURGERY     PEG TUBE PLACEMENT     TRACHEOSTOMY      There were no vitals filed for this visit.   Subjective Assessment - 12/24/20 1120     Subjective pt pleasant, acocmpanied by his father    Patient is accompained by: Family member    Currently in Pain? No/denies                   ADULT SLP TREATMENT - 12/24/20 0001       Treatment Provided   Treatment provided Cognitive-Linquistic      Cognitive-Linquistic Treatment   Treatment focused on Cognition;Patient/family/caregiver education    Skilled Treatment MENTAL MANIPULATION: recall of 3 pieces of information 50% improving to 100% with repetition of information; ordering of information 75% improving to 100% with minimal cues              SLP Education - 12/24/20 1127     Education Details provided    Person(s) Educated Patient;Parent(s)    Methods Explanation;Demonstration     Comprehension Verbalized understanding;Need further instruction              SLP Short Term Goals - 12/16/20 1151       SLP SHORT TERM GOAL #1   Title With moderate cues, pt will increase vocal intensity to achieve > 95% speech intelligibility at the sentence level in a mildly noisey environment.    Baseline < 50%    Time 10    Period --   sessions   Status New      SLP SHORT TERM GOAL #2   Title Pt will recall 2 of his current medicines and their function across 6 out of 10 session.    Baseline unable    Time 10    Period --   sessions   Status New      SLP SHORT TERM GOAL #3   Title After listening to short story, pt will answer [facility] questions with 75% accuracy.    Baseline 67%    Time 10    Period --   sessions   Status New      SLP SHORT TERM GOAL #4   Title With moderate cues, pt  will perform basic mental manipulation activities with 75% accuracy.    Baseline < 25% with maximal assistance    Time 10    Period --   sessions   Status New              SLP Long Term Goals - 12/16/20 1156       SLP LONG TERM GOAL #1   Title Pt will utilize speech intelligibility strategies to achieve > 95% intelligibility at the sentence level in a mildly noisy environment.    Baseline <75%    Time 12    Period Weeks    Status New    Target Date 03/09/21      SLP LONG TERM GOAL #2   Title Pt will recall list of his current medications with 90% accuracy.    Baseline unable    Time 12    Period Weeks    Status New    Target Date 03/09/21              Plan - 12/24/20 1128     Clinical Impression Statement Pt presents with moderate cognitive communication deficits that are further complicated by deficits in vision and bilateral upper extremity weakness. Pt demonstrated good stimulabiity with menta manipulation tasks during today's session. Therefore, continue to recommend skilled ST intervention is required to target these deficits to increase pt's functional  independence and reduce caregiver burden.    Speech Therapy Frequency 2x / week    Duration 12 weeks    Treatment/Interventions Language facilitation;Cueing hierarchy;Internal/external aids;Functional tasks;SLP instruction and feedback;Patient/family education;Cognitive reorganization;Compensatory strategies    Potential to Achieve Goals Fair    Potential Considerations Severity of impairments;Other (comment);Co-morbidities    Consulted and Agree with Plan of Care Patient;Family member/caregiver    Family Member Consulted pt's dad             Patient will benefit from skilled therapeutic intervention in order to improve the following deficits and impairments:   Cognitive communication deficit  History of CVA with residual deficit    Problem List Patient Active Problem List   Diagnosis Date Noted   Sepsis due to vancomycin resistant Enterococcus species (HCC) 06/06/2019   SIRS (systemic inflammatory response syndrome) (HCC) 06/05/2019   Acute lower UTI 06/05/2019   VRE (vancomycin-resistant Enterococci) infection 06/05/2019   Anemia 06/05/2019   Skin ulcer of sacrum with necrosis of muscle (HCC)    Urinary retention    Type 2 diabetes mellitus without complication, with long-term current use of insulin (HCC)    Tachycardia    Lower extremity edema    Acute metabolic encephalopathy    Obstructive sleep apnea    Morbid obesity with BMI of 60.0-69.9, adult (HCC)    Goals of care, counseling/discussion    Palliative care encounter    Sepsis (HCC) 04/27/2019   H/O insulin dependent diabetes mellitus 04/27/2019   History of CVA with residual deficit 04/27/2019   Seizure disorder (HCC) 04/27/2019   Decubitus ulcer of sacral region, stage 4 (HCC) 04/27/2019   Sherrey North B. Dreama Saa M.S., CCC-SLP, [facility] Speech-Language Pathologist Rehabilitation Services Office [phone]  Paul Hall 12/24/2020, 11:32 AM  [place] [facility] MAIN [facility]  SERVICES [address] [place], [place], [address] Phone: [phone]   Fax:  [phone]   Name: Paul Hall MRN: [id number] Date of Birth: [birth year]

## 2020-12-28 ENCOUNTER — Encounter: Payer: Self-pay | Admitting: Occupational Therapy

## 2020-12-28 ENCOUNTER — Ambulatory Visit: Payer: BC Managed Care – PPO | Admitting: Speech Pathology

## 2020-12-28 ENCOUNTER — Ambulatory Visit: Payer: BC Managed Care – PPO | Admitting: Occupational Therapy

## 2020-12-28 ENCOUNTER — Other Ambulatory Visit: Payer: Self-pay

## 2020-12-28 DIAGNOSIS — R471 Dysarthria and anarthria: Secondary | ICD-10-CM | POA: Diagnosis not present

## 2020-12-28 DIAGNOSIS — R278 Other lack of coordination: Secondary | ICD-10-CM | POA: Diagnosis not present

## 2020-12-28 DIAGNOSIS — M6281 Muscle weakness (generalized): Secondary | ICD-10-CM

## 2020-12-28 DIAGNOSIS — R41841 Cognitive communication deficit: Secondary | ICD-10-CM

## 2020-12-28 DIAGNOSIS — I6930 Unspecified sequelae of cerebral infarction: Secondary | ICD-10-CM

## 2020-12-28 NOTE — Therapy (Signed)
[facility] MAIN [facility] SERVICES [address] [place], [place], [address] Phone: [phone]   Fax:  [phone]  Occupational Therapy Treatment  Patient Details  Name: Paul Hall MRN: [id number] Date of Birth: 19-Dec-1995 Referring Provider (OT): Lenise Herald   Encounter Date: 12/28/2020   OT End of Session - 12/28/20 1742     Visit Number 3    Number of Visits 24    Date for OT Re-Evaluation 03/09/21    Authorization Type Progress report period starting 12/15/2020    OT Start Time 1600    OT Stop Time 1650    OT Time Calculation (min) 50 min    Activity Tolerance Patient tolerated treatment well    Behavior During Therapy [facility] for tasks assessed/performed             Past Medical History:  Diagnosis Date   Diabetes mellitus (HCC)    Hypertension    Stroke [facility])     Past Surgical History:  Procedure Laterality Date   IVC FILTER PLACEMENT ([facility] HX)     LEG SURGERY     PEG TUBE PLACEMENT     TRACHEOSTOMY      There were no vitals filed for this visit.   Subjective Assessment - 12/28/20 1742     Subjective  Pt. was present with his mother, and father.    Patient is accompanied by: Family member    Pertinent History Pt. is a 25 y.o. male who was hospitalized, and diagnosed with COVID-19, sustained a CVA with resultant quadreplegia in 01/2019. Pt. was hospitalized with VRE  UTI. Pt. PMHx includes: urinary retention, Seizure Disorder, Obstructive Sleep Disorder, DM Type II, and Morbid obesity. Pt. resides at home with his parents.    Currently in Pain? Other (Comment)   Soreness 8/10 in right hand digit PIP, and DIPs with ROM, 4/10 left hand digit PIP, and DIPs with  ROM.           Therapeutic Exercise:   Patient tolerated active range of motion reps for bilateral shoulder flexion abduction. Passive range of motion for bilateral elbow flexion and extension with slow prolonged gentle stretching for elbow extension,  wrist extension, and digit extension. Patient tolerated moist heat modality to his bilateral hands prior to range of motion. Patient worked on initiating bilateral alternating rowing with emphasis on forming bilateral forearm supination.   Pt. presents with flexor tone, and tightness in the bilateral elbows, forearms, wrist, and digits. Patient tolerated range of motion, and stretching to his bilateral elbows, wrists, and digits. Pt. consistently has soreness in the right  PIPs/DIPs with ROM 8/10, 4/10 in the left. Pt. presents with active wrist, and MP extension, however presents with MP hyperextension with PIP/DIP flexion when attempting active digit MP extension. Pt. Was fit with a universal cuff. Will plan to attempt to perform hand to mouth patterns of movement with the universal cuff in preparation for self feeding, and self grooming tasks.  Patient continues to work on normalizing tone in the bilateral upper extremities in order to improve range of motion,  and facilitate active range of motion in order to increase engagement in ADLs and IADL tasks.                         OT Education - 12/28/20 1742     Education Details OT services, POC, and goals    Person(s) Educated Patient    Methods Explanation  Comprehension Verbalized understanding                 OT Long Term Goals - 12/15/20 1546       OT LONG TERM GOAL #1   Title Pt. will improve FOTO score by 2 points for clinically significant improvements during ADLs, and IADLs,    Baseline Eval: FOTO score: 41    Time 12    Period Weeks    Status New    Target Date 03/09/21      OT LONG TERM GOAL #2   Title Pt. will improve right shoulder flexion by 10 degrees to assist caregivers with UE dressing    Baseline Eval: R: 62(94), L: 114(120    Time 12    Period Weeks    Status New    Target Date 03/09/21      OT LONG TERM GOAL #3   Title Pt. will improve right Abduction by 10 degrees to assist  caregivers with underarm hygiene care.    Baseline Eval: R: 76(93)    Time 12    Period Weeks    Status New    Target Date 03/09/21      OT LONG TERM GOAL #4   Title Pt. will demonstrate visual compensatory strategies for navigating through his environment.    Baseline Eval: Limited    Time 12    Period Weeks    Status New    Target Date 03/09/21      OT LONG TERM GOAL #5   Title Pt. will demonstrate visual compensatory strategies for tabletop tasks within his near space.    Baseline Eval: Pt. has difficulty    Time 12    Period Weeks    Status New    Target Date 03/09/21      OT LONG TERM GOAL #6   Title Pt. will perform hand to mouth patterns with min A in preparation for self-feeding.    Baseline Eval: Pt. has difficulty    Time 12    Period Weeks    Status New    Target Date 03/09/21      OT LONG TERM GOAL #7   Title Pt. will perform one step self-grooming tasks with minA.    Baseline Eval: pt. has difficulty    Time 12    Period Weeks    Status New    Target Date 03/09/21                   Plan - 12/28/20 1744     Clinical Impression Statement Pt. presents with flexor tone, and tightness in the bilateral elbows, forearms, wrist, and digits. Patient tolerated range of motion, and stretching to his bilateral elbows, wrists, and digits. Pt. consistently has soreness in the right  PIPs/DIPs with ROM 8/10, 4/10 in the left. Pt. presents with active wrist, and MP extension, however presents with MP hyperextension with PIP/DIP flexion when attempting active digit MP extension. Pt. Was fit with a universal cuff. Will plan to attempt to perform hand to mouth patterns of movement with the universal cuff in preparation for self feeding, and self grooming tasks.  Patient continues to work on normalizing tone in the bilateral upper extremities in order to improve range of motion,  and facilitate active range of motion in order to increase engagement in ADLs and IADL  tasks.      OT Occupational Profile and History Detailed Assessment- Review of Records and additional review of physical, cognitive,  psychosocial history related to current functional performance    Occupational performance deficits (Please refer to evaluation for details): ADL's;IADL's    Body Structure / Function / Physical Skills ADL;FMC;ROM;Dexterity;IADL;Endurance;Obesity;Strength;UE functional use    Rehab Potential Good    Clinical Decision Making Multiple treatment options, significant modification of task necessary    Comorbidities Affecting Occupational Performance: Presence of comorbidities impacting occupational performance    Modification or Assistance to Complete Evaluation  Max significant modification of tasks or assist is necessary to complete    OT Frequency 2x / week    OT Duration 12 weeks    OT Treatment/Interventions Self-care/ADL training;Therapeutic exercise;Neuromuscular education;Visual/perceptual remediation/compensation;Patient/family education;Therapeutic activities;DME and/or AE instruction;Passive range of motion;Moist Heat    Consulted and Agree with Plan of Care Patient             Patient will benefit from skilled therapeutic intervention in order to improve the following deficits and impairments:   Body Structure / Function / Physical Skills: ADL, FMC, ROM, Dexterity, IADL, Endurance, Obesity, Strength, UE functional use       Visit Diagnosis: Muscle weakness (generalized)    Problem List Patient Active Problem List   Diagnosis Date Noted   Sepsis due to vancomycin resistant Enterococcus species (HCC) 06/06/2019   SIRS (systemic inflammatory response syndrome) (HCC) 06/05/2019   Acute lower UTI 06/05/2019   VRE (vancomycin-resistant Enterococci) infection 06/05/2019   Anemia 06/05/2019   Skin ulcer of sacrum with necrosis of muscle (HCC)    Urinary retention    Type 2 diabetes mellitus without complication, with long-term current use of  insulin (HCC)    Tachycardia    Lower extremity edema    Acute metabolic encephalopathy    Obstructive sleep apnea    Morbid obesity with BMI of 60.0-69.9, adult (HCC)    Goals of care, counseling/discussion    Palliative care encounter    Sepsis (HCC) 04/27/2019   H/O insulin dependent diabetes mellitus 04/27/2019   History of CVA with residual deficit 04/27/2019   Seizure disorder (HCC) 04/27/2019   Decubitus ulcer of sacral region, stage 4 (HCC) 04/27/2019    Olegario Messier, MS, OTR/L 12/28/2020, 5:49 PM  [place] [facility] MAIN [facility] SERVICES [address] [place], [place], [address] Phone: [phone]   Fax:  [phone]  Name: Paul Hall MRN: [id number] Date of Birth: [birth year]

## 2020-12-30 ENCOUNTER — Encounter: Payer: Self-pay | Admitting: Occupational Therapy

## 2020-12-30 ENCOUNTER — Other Ambulatory Visit: Payer: Self-pay

## 2020-12-30 ENCOUNTER — Ambulatory Visit: Payer: BC Managed Care – PPO | Admitting: Speech Pathology

## 2020-12-30 ENCOUNTER — Ambulatory Visit: Payer: BC Managed Care – PPO | Admitting: Occupational Therapy

## 2020-12-30 DIAGNOSIS — R41841 Cognitive communication deficit: Secondary | ICD-10-CM

## 2020-12-30 DIAGNOSIS — M6281 Muscle weakness (generalized): Secondary | ICD-10-CM | POA: Diagnosis not present

## 2020-12-30 DIAGNOSIS — I6930 Unspecified sequelae of cerebral infarction: Secondary | ICD-10-CM

## 2020-12-30 DIAGNOSIS — R278 Other lack of coordination: Secondary | ICD-10-CM

## 2020-12-30 DIAGNOSIS — R471 Dysarthria and anarthria: Secondary | ICD-10-CM | POA: Diagnosis not present

## 2020-12-30 NOTE — Therapy (Signed)
[facility] MAIN [facility] SERVICES [address] [place], [place], [address] Phone: [phone]   Fax:  [phone]  Speech Language Pathology Treatment  Patient Details  Name: Paul Hall MRN: [id number] Date of Birth: 1995/08/22 Referring Provider (SLP): Lenise Herald   Encounter Date: 12/28/2020   End of Session - 12/30/20 0717     Visit Number 3    Number of Visits 25    Date for SLP Re-Evaluation 03/09/21    Authorization Type [employer] [place] [employer] PPO    Authorization Time Period 12/15/2020 thru 16/09/2020    Authorization - Visit Number 3    Authorization - Number of Visits 12    Progress Note Due on Visit 10    SLP Start Time 1505    SLP Stop Time  1600    SLP Time Calculation (min) 55 min    Activity Tolerance Patient tolerated treatment well             Past Medical History:  Diagnosis Date   Diabetes mellitus (HCC)    Hypertension    Stroke [facility])     Past Surgical History:  Procedure Laterality Date   IVC FILTER PLACEMENT ([facility] HX)     LEG SURGERY     PEG TUBE PLACEMENT     TRACHEOSTOMY      There were no vitals filed for this visit.   Subjective Assessment - 12/30/20 0710     Subjective pt pleasant, acocmpanied by his father, slightly emotional when talking about his loss of independence    Patient is accompained by: Family member    Currently in Pain? No/denies                   ADULT SLP TREATMENT - 12/30/20 0001       Treatment Provided   Treatment provided Cognitive-Linquistic      Cognitive-Linquistic Treatment   Treatment focused on Cognition;Patient/family/caregiver education    Skilled Treatment MENTAL MANIPULATION: recall of 3 pieces of information 100% with independent use of memory strategy (repetition); ordering of information 100%; HELP for Memory - Selectiving Information - p 13thru15- 75% improving to 1005 with minimal cues; p25 80%              SLP  Education - 12/30/20 0717     Education Details information processing    Person(s) Educated Patient;Parent(s)    Methods Explanation;Demonstration;Verbal cues    Comprehension Verbalized understanding;Returned demonstration;Need further instruction              SLP Short Term Goals - 12/16/20 1151       SLP SHORT TERM GOAL #1   Title With moderate cues, pt will increase vocal intensity to achieve > 95% speech intelligibility at the sentence level in a mildly noisey environment.    Baseline < 50%    Time 10    Period --   sessions   Status New      SLP SHORT TERM GOAL #2   Title Pt will recall 2 of his current medicines and their function across 6 out of 10 session.    Baseline unable    Time 10    Period --   sessions   Status New      SLP SHORT TERM GOAL #3   Title After listening to short story, pt will answer [facility] questions with 75% accuracy.    Baseline 67%    Time 10    Period --  sessions   Status New      SLP SHORT TERM GOAL #4   Title With moderate cues, pt will perform basic mental manipulation activities with 75% accuracy.    Baseline < 25% with maximal assistance    Time 10    Period --   sessions   Status New              SLP Long Term Goals - 12/16/20 1156       SLP LONG TERM GOAL #1   Title Pt will utilize speech intelligibility strategies to achieve > 95% intelligibility at the sentence level in a mildly noisy environment.    Baseline <75%    Time 12    Period Weeks    Status New    Target Date 03/09/21      SLP LONG TERM GOAL #2   Title Pt will recall list of his current medications with 90% accuracy.    Baseline unable    Time 12    Period Weeks    Status New    Target Date 03/09/21              Plan - 12/30/20 4235     Clinical Impression Statement Pt presents with moderate cognitive communication deficits that are further complicated by deficits in vision and bilateral upper extremity weakness. Pt demonstrated good  stimulabiity with menta manipulation tasks during today's session. Therefore, continue to recommend skilled ST intervention is required to target these deficits to increase pt's functional independence and reduce caregiver burden.    Speech Therapy Frequency 2x / week    Duration 12 weeks    Treatment/Interventions Language facilitation;Cueing hierarchy;Internal/external aids;Functional tasks;SLP instruction and feedback;Patient/family education;Cognitive reorganization;Compensatory strategies    Potential to Achieve Goals Fair    Potential Considerations Severity of impairments;Other (comment);Co-morbidities    Consulted and Agree with Plan of Care Patient;Family member/caregiver    Family Member Consulted pt's dad             Patient will benefit from skilled therapeutic intervention in order to improve the following deficits and impairments:   Cognitive communication deficit  History of CVA with residual deficit    Problem List Patient Active Problem List   Diagnosis Date Noted   Sepsis due to vancomycin resistant Enterococcus species (HCC) 06/06/2019   SIRS (systemic inflammatory response syndrome) (HCC) 06/05/2019   Acute lower UTI 06/05/2019   VRE (vancomycin-resistant Enterococci) infection 06/05/2019   Anemia 06/05/2019   Skin ulcer of sacrum with necrosis of muscle (HCC)    Urinary retention    Type 2 diabetes mellitus without complication, with long-term current use of insulin (HCC)    Tachycardia    Lower extremity edema    Acute metabolic encephalopathy    Obstructive sleep apnea    Morbid obesity with BMI of 60.0-69.9, adult (HCC)    Goals of care, counseling/discussion    Palliative care encounter    Sepsis (HCC) 04/27/2019   H/O insulin dependent diabetes mellitus 04/27/2019   History of CVA with residual deficit 04/27/2019   Seizure disorder (HCC) 04/27/2019   Decubitus ulcer of sacral region, stage 4 (HCC) 04/27/2019   Jonnie Kubly B. Dreama Saa M.S., CCC-SLP,  [facility] Speech-Language Pathologist Rehabilitation Services Office [phone]  Reuel Derby 12/30/2020, 7:18 AM  [facility] MAIN [facility] SERVICES [address] [place], [place], [address] Phone: [phone]   Fax:  [phone]   Name: Paul Hall MRN: [id number] Date of Birth: 1995/04/29

## 2020-12-30 NOTE — Therapy (Addendum)
[facility] MAIN [facility] SERVICES [address] [place], [place], [address] Phone: [phone]   Fax:  [phone]  Occupational Therapy Treatment  Patient Details  Name: Paul Hall MRN: [id number] Date of Birth: [birth year] Referring Provider (OT): Lenise Herald   Encounter Date: 12/30/2020   OT End of Session - 12/30/20 1722     Visit Number 4    Number of Visits 24    Date for OT Re-Evaluation 03/24/21    Authorization Type Progress report period starting 12/15/2020    OT Start Time 1345    OT Stop Time 1430    OT Time Calculation (min) 45 min    Activity Tolerance Patient tolerated treatment well    Behavior During Therapy [facility] for tasks assessed/performed             Past Medical History:  Diagnosis Date   Diabetes mellitus (HCC)    Hypertension    Stroke [facility])     Past Surgical History:  Procedure Laterality Date   IVC FILTER PLACEMENT ([facility] HX)     LEG SURGERY     PEG TUBE PLACEMENT     TRACHEOSTOMY      There were no vitals filed for this visit.   Subjective Assessment - 12/30/20 1721     Subjective  Pt. was present with his father this afternoon.   Patient is accompanied by: Family member    Pertinent History Pt. is a 25 y.o. male who was hospitalized, and diagnosed with COVID-19, sustained a CVA with resultant quadreplegia in 01/2019. Pt. was hospitalized with VRE  UTI. Pt. PMHx includes: urinary retention, Seizure Disorder, Obstructive Sleep Disorder, DM Type II, and Morbid obesity. Pt. resides at home with his parents.    Currently in Pain? No/denies            Therapeutic Exercise:   Patient tolerated active range of motion reps for bilateral shoulder flexion abduction. Passive range of motion for bilateral elbow flexion and extension with slow prolonged gentle stretching for elbow extension, wrist extension, and digit extension. Patient tolerated moist heat modality to his bilateral hands prior to  range of motion. Patient worked on initiating bilateral alternating rowing with emphasis on forming bilateral forearm supination.   Pt. was provided with a universal cuff for hand to mouth patterns for self-feeding. Pt. required the universal cuff to be fit, applied to the right hand, and angled at the MP joints. Pt. Education was provided about also attempting to use it with a toothbrush.  Pt. presents with flexor tone, and tightness in the bilateral elbows, forearms, wrist, and digits. Patient tolerated range of motion, and stretching to his bilateral elbows, wrists, and digits. Pt. Reports 3/10 soreness in the bilateral PIPs/DIPs with ROM. Pt. continues to present with active wrist, and MP extension, however presents with MP hyperextension with PIP/DIP flexion when attempting active digit MP extension. Patient continues to work on normalizing tone in the bilateral upper extremities in order to improve range of motion,  and facilitate active range of motion in order to increase engagement in ADLs and IADL tasks.                      OT Education - 12/30/20 1721     Education Details OT services, POC, and goals    Person(s) Educated Patient    Methods Explanation    Comprehension Verbalized understanding  OT Long Term Goals - 12/31/20 0843       OT LONG TERM GOAL #1   Title Pt. will improve FOTO score by 2 points for clinically significant improvements during ADLs, and IADLs,    Baseline Eval: FOTO score: 41    Time 12    Period Weeks    Status On-going    Target Date 03/24/21      OT LONG TERM GOAL #2   Title Pt. will improve right shoulder flexion by 10 degrees to assist caregivers with UE dressing    Baseline Eval: R: 62(94), L: 114(120    Time 12    Period Weeks    Status On-going    Target Date 03/25/21      OT LONG TERM GOAL #3   Title Pt. will improve right Abduction by 10 degrees to assist caregivers with underarm hygiene care.     Baseline Eval: R: 76(93)    Time 12    Period Weeks    Status On-going    Target Date 03/24/21      OT LONG TERM GOAL #4   Title Pt. will demonstrate visual compensatory strategies for navigating through his environment.    Baseline Eval: Limited    Time 12    Period Weeks    Status On-going    Target Date 03/24/21      OT LONG TERM GOAL #5   Title Pt. will demonstrate visual compensatory strategies for tabletop tasks within his near space.    Baseline Eval: Pt. has difficulty    Time 12    Period Weeks    Status On-going    Target Date 03/24/21      OT LONG TERM GOAL #6   Title Pt. will perform hand to mouth patterns with min A in preparation for self-feeding.    Baseline Eval: Pt. has difficulty    Time 12    Period Weeks    Status On-going    Target Date 03/24/21      OT LONG TERM GOAL #7   Title Pt. will perform one step self-grooming tasks with minA.    Baseline Eval: pt. has difficulty    Time 12    Period Weeks    Status On-going    Target Date 03/24/21                   Plan - 12/30/20 1722     Clinical Impression Statement Clinical Impression Statement  Pt. was provided with a universal cuff for hand to mouth patterns for self-feeding. Pt. required the universal cuff to be fit, applied to the right hand, and angled at the MP joints. Pt. Education was provided about perfroming self-grooming tasks. Pt. presents with flexor tone, and tightness in the bilateral elbows, forearms, wrist, and digits. Patient tolerated range of motion, and stretching to his bilateral elbows, wrists, and digits. Pt. Reports 3/10 soreness in the right PIPs/DIPs with ROM. Pt. continues to present with active wrist, and MP extension, however presents with MP hyperextension with PIP/DIP flexion when attempting active digit MP extension. Patient continues to work on normalizing tone in the bilateral upper extremities in order to improve range of motion,  and facilitate active range of  motion in order to increase engagement in ADLs and IADL tasks.    OT Occupational Profile and History Detailed Assessment- Review of Records and additional review of physical, cognitive, psychosocial history related to current functional performance    Occupational performance deficits (  Please refer to evaluation for details): ADL's;IADL's    Body Structure / Function / Physical Skills ADL;FMC;ROM;Dexterity;IADL;Endurance;Obesity;Strength;UE functional use    Rehab Potential Good    Clinical Decision Making Multiple treatment options, significant modification of task necessary    Comorbidities Affecting Occupational Performance: Presence of comorbidities impacting occupational performance    Modification or Assistance to Complete Evaluation  Max significant modification of tasks or assist is necessary to complete    OT Frequency 2x / week    OT Duration 12 weeks    OT Treatment/Interventions Self-care/ADL training;Therapeutic exercise;Neuromuscular education;Visual/perceptual remediation/compensation;Patient/family education;Therapeutic activities;DME and/or AE instruction;Passive range of motion;Moist Heat    Consulted and Agree with Plan of Care Patient             Patient will benefit from skilled therapeutic intervention in order to improve the following deficits and impairments:   Body Structure / Function / Physical Skills: ADL, FMC, ROM, Dexterity, IADL, Endurance, Obesity, Strength, UE functional use       Visit Diagnosis: Muscle weakness (generalized)  Other lack of coordination    Problem List Patient Active Problem List   Diagnosis Date Noted   Sepsis due to vancomycin resistant Enterococcus species (HCC) 06/06/2019   SIRS (systemic inflammatory response syndrome) (HCC) 06/05/2019   Acute lower UTI 06/05/2019   VRE (vancomycin-resistant Enterococci) infection 06/05/2019   Anemia 06/05/2019   Skin ulcer of sacrum with necrosis of muscle (HCC)    Urinary retention     Type 2 diabetes mellitus without complication, with long-term current use of insulin (HCC)    Tachycardia    Lower extremity edema    Acute metabolic encephalopathy    Obstructive sleep apnea    Morbid obesity with BMI of 60.0-69.9, adult (HCC)    Goals of care, counseling/discussion    Palliative care encounter    Sepsis (HCC) 04/27/2019   H/O insulin dependent diabetes mellitus 04/27/2019   History of CVA with residual deficit 04/27/2019   Seizure disorder (HCC) 04/27/2019   Decubitus ulcer of sacral region, stage 4 (HCC) 04/27/2019    Olegario Messier, MS, OTR/L 12/31/2020, 8:45 AM  [facility] MAIN [facility] SERVICES [address] [place], [place], [address] Phone: [phone]   Fax:  [phone]  Name: Paul Hall MRN: [id number] Date of Birth: Sep 06, 1995

## 2020-12-31 DIAGNOSIS — I6389 Other cerebral infarction: Secondary | ICD-10-CM | POA: Diagnosis not present

## 2020-12-31 DIAGNOSIS — L8994 Pressure ulcer of unspecified site, stage 4: Secondary | ICD-10-CM | POA: Diagnosis not present

## 2020-12-31 DIAGNOSIS — G4733 Obstructive sleep apnea (adult) (pediatric): Secondary | ICD-10-CM | POA: Diagnosis not present

## 2020-12-31 NOTE — Addendum Note (Signed)
Addended by: Avon Gully on: 12/31/2020 08:48 AM   Modules accepted: Orders

## 2021-01-01 NOTE — Therapy (Signed)
[facility] MAIN [facility] SERVICES [address] [place], [place], [address] Phone: [phone]   Fax:  [phone]  Speech Language Pathology Treatment  Patient Details  Name: Paul Hall MRN: [id number] Date of Birth: [birth year] Referring Provider (SLP): Lenise Herald   Encounter Date: 12/30/2020   End of Session - 01/01/21 0743     Visit Number 4    Number of Visits 25    Date for SLP Re-Evaluation 03/09/21    Authorization Type [employer] [place] COMM PPO    Authorization Time Period 12/15/2020 thru 16/09/2020    Authorization - Visit Number 4    Authorization - Number of Visits 12    Progress Note Due on Visit 10    SLP Start Time 1320    SLP Stop Time  1345    SLP Time Calculation (min) 25 min             Past Medical History:  Diagnosis Date   Diabetes mellitus (HCC)    Hypertension    Stroke [facility])     Past Surgical History:  Procedure Laterality Date   IVC FILTER PLACEMENT ([facility] HX)     LEG SURGERY     PEG TUBE PLACEMENT     TRACHEOSTOMY      There were no vitals filed for this visit.   Subjective Assessment - 01/01/21 0741     Subjective pt arrived to session late d/t transportation issues    Patient is accompained by: Family member    Currently in Pain? No/denies                   ADULT SLP TREATMENT - 01/01/21 0001       Treatment Provided   Treatment provided Cognitive-Linquistic      Cognitive-Linquistic Treatment   Treatment focused on Cognition;Patient/family/caregiver education    Skilled Treatment COGNITION: Basic math problems including time: pt unable to perform with maximal verbal assistance, likely d/t processing deficits; maximal assistance provided for basic functional addition problems targeting ADLs (used level 1 for both activities on app [employer])              SLP Education - 01/01/21 0742     Education Details difficulty of today's session     Person(s) Educated Patient;Parent(s)    Methods Explanation    Comprehension Verbalized understanding;Need further instruction              SLP Short Term Goals - 01/01/21 0744       SLP SHORT TERM GOAL #1   Title With moderate cues, pt will increase vocal intensity to achieve > 95% speech intelligibility at the sentence level in a mildly noisey environment.    Time 10    Period --   sessions   Status On-going      SLP SHORT TERM GOAL #2   Title Pt will comprehend list of current medications to be able to answer yes/no questions with 755 accuracy given minimal assistance.    Baseline goal revised to address functional deficits    Time 10    Period --   sessions   Status Revised      SLP SHORT TERM GOAL #3   Title After listening to functional information, pt will recall 2 pieces of functional information with 50% accuracy given minimal assistance.    Baseline goal revised to address functional deficits    Time 10    Period --  sessions   Status Revised      SLP SHORT TERM GOAL #4   Title With moderate cues, pt will perform basic mental manipulation activities with 75% accuracy.    Time 10    Period --   sessions   Status On-going              SLP Long Term Goals - 01/01/21 0747       SLP LONG TERM GOAL #1   Title Pt will utilize speech intelligibility strategies to achieve > 95% intelligibility at the sentence level in a mildly noisy environment.    Time 12    Period Weeks    Status On-going    Target Date 03/09/21      SLP LONG TERM GOAL #2   Title Pt will comprehend his list of medications in order to recall 6 medications with minimal assistance.    Baseline revised on 01/01/2021 to address functional deficits    Time 12    Period Weeks    Status Revised    Target Date 03/09/21      SLP LONG TERM GOAL #3   Title Pt will verbally generate solutions to problems encountered in everyday living tasks with moderate assistance.    Baseline new goal  added to address functional deficits    Time 12    Period Weeks    Status New    Target Date 03/09/21              Plan - 01/01/21 0743     Clinical Impression Statement Pt presents with moderate cognitive communication deficits that are further complicated by deficits in vision and bilateral upper extremity weakness. Pt is always eager to participate but had signfiicatn difficulty completing basic functional math problems/calculations during today's session. Therefore, continue to recommend skilled ST intervention is required to target these deficits to increase pt's functional independence and reduce caregiver burden.    Speech Therapy Frequency 2x / week    Duration 12 weeks    Treatment/Interventions Language facilitation;Cueing hierarchy;Internal/external aids;Functional tasks;SLP instruction and feedback;Patient/family education;Cognitive reorganization;Compensatory strategies    Potential to Achieve Goals Fair    Potential Considerations Severity of impairments;Other (comment);Co-morbidities    Consulted and Agree with Plan of Care Patient;Family member/caregiver    Family Member Consulted pt's dad             Patient will benefit from skilled therapeutic intervention in order to improve the following deficits and impairments:   Cognitive communication deficit  History of CVA with residual deficit    Problem List Patient Active Problem List   Diagnosis Date Noted   Sepsis due to vancomycin resistant Enterococcus species (HCC) 06/06/2019   SIRS (systemic inflammatory response syndrome) (HCC) 06/05/2019   Acute lower UTI 06/05/2019   VRE (vancomycin-resistant Enterococci) infection 06/05/2019   Anemia 06/05/2019   Skin ulcer of sacrum with necrosis of muscle (HCC)    Urinary retention    Type 2 diabetes mellitus without complication, with long-term current use of insulin (HCC)    Tachycardia    Lower extremity edema    Acute metabolic encephalopathy     Obstructive sleep apnea    Morbid obesity with BMI of 60.0-69.9, adult (HCC)    Goals of care, counseling/discussion    Palliative care encounter    Sepsis (HCC) 04/27/2019   H/O insulin dependent diabetes mellitus 04/27/2019   History of CVA with residual deficit 04/27/2019   Seizure disorder (HCC) 04/27/2019   Decubitus ulcer of  sacral region, stage 4 (HCC) 04/27/2019   Asyria Kolander B. Dreama Saa M.S., CCC-SLP, [facility] Speech-Language Pathologist Rehabilitation Services Office [phone]  Reuel Derby 01/01/2021, 7:55 AM  [facility] MAIN [facility] SERVICES [address] [place], [place], [address] Phone: [phone]   Fax:  [phone]   Name: Paul Hall MRN: [id number] Date of Birth: [birth year]

## 2021-01-04 ENCOUNTER — Ambulatory Visit: Payer: BC Managed Care – PPO | Admitting: Speech Pathology

## 2021-01-04 ENCOUNTER — Ambulatory Visit: Payer: BC Managed Care – PPO | Admitting: Occupational Therapy

## 2021-01-04 ENCOUNTER — Ambulatory Visit: Payer: BC Managed Care – PPO | Attending: Physician Assistant

## 2021-01-04 ENCOUNTER — Other Ambulatory Visit: Payer: Self-pay

## 2021-01-04 VITALS — BP 138/71 | HR 90 | Resp 18 | Ht 67.0 in | Wt 320.0 lb

## 2021-01-04 DIAGNOSIS — R2681 Unsteadiness on feet: Secondary | ICD-10-CM | POA: Insufficient documentation

## 2021-01-04 DIAGNOSIS — I6930 Unspecified sequelae of cerebral infarction: Secondary | ICD-10-CM | POA: Diagnosis not present

## 2021-01-04 DIAGNOSIS — M6281 Muscle weakness (generalized): Secondary | ICD-10-CM | POA: Diagnosis not present

## 2021-01-04 DIAGNOSIS — R41841 Cognitive communication deficit: Secondary | ICD-10-CM

## 2021-01-04 DIAGNOSIS — R278 Other lack of coordination: Secondary | ICD-10-CM | POA: Diagnosis not present

## 2021-01-04 DIAGNOSIS — R471 Dysarthria and anarthria: Secondary | ICD-10-CM | POA: Diagnosis not present

## 2021-01-04 DIAGNOSIS — R262 Difficulty in walking, not elsewhere classified: Secondary | ICD-10-CM | POA: Insufficient documentation

## 2021-01-04 DIAGNOSIS — R269 Unspecified abnormalities of gait and mobility: Secondary | ICD-10-CM | POA: Insufficient documentation

## 2021-01-04 NOTE — Patient Instructions (Signed)
List ways in which he overcame cognitive/emotional barriers to today's therapy sessions

## 2021-01-04 NOTE — Therapy (Signed)
[facility] MAIN [facility] SERVICES [address] [place], [place], [address] Phone: [phone]   Fax:  [phone]  Occupational Therapy Treatment  Patient Details  Name: Paul Hall MRN: [id number] Date of Birth: 07/23/1995 Referring Provider (OT): Lenise Herald   Encounter Date: 01/04/2021   OT End of Session - 01/04/21 1536     Visit Number 5    Number of Visits 24    Date for OT Re-Evaluation 03/24/21    Authorization Type Progress report period starting 12/15/2020    OT Start Time 1230    OT Stop Time 1315    OT Time Calculation (min) 45 min    Activity Tolerance Patient tolerated treatment well    Behavior During Therapy Anxious             Past Medical History:  Diagnosis Date   Diabetes mellitus (HCC)    Hypertension    Stroke [facility])     Past Surgical History:  Procedure Laterality Date   IVC FILTER PLACEMENT ([facility] HX)     LEG SURGERY     PEG TUBE PLACEMENT     TRACHEOSTOMY      There were no vitals filed for this visit.   Subjective Assessment - 01/04/21 1534     Subjective  Pt. reports that he was hesitant to stand during PT this morning.    Patient is accompanied by: Family member    Pertinent History Pt. is a 25 y.o. male who was hospitalized, and diagnosed with COVID-19, sustained a CVA with resultant quadreplegia in 01/2019. Pt. was hospitalized with VRE  UTI. Pt. PMHx includes: urinary retention, Seizure Disorder, Obstructive Sleep Disorder, DM Type II, and Morbid obesity. Pt. resides at home with his parents.    Currently in Pain? No/denies   3/10 soreness for the right 2nd , and 3rd digits, and left hand. 6/10 soreness with right 4th,a ns 5th digit PIP, and DIP extension           Therapeutic Exercise:   Patient tolerated active range of motion reps for bilateral shoulder flexion abduction, followed by strengthening with 2# cuff weights in place 2 sets 10 reps each.  Passive range of motion with  slow prolonged gentle stretching for  bilateral forearm supination, elbow extension, wrist extension, and digit extension. Pt. Worked on reps of wrist extension with attempts  for digit extension with gentle tapping at the foerarm extensors to facilitate wrist, and digit extension.Patient tolerated moist heat modality to his bilateral hands prior to range of motion.     Pt. Reports that he worked on feeding himself Jello at home, and was able to hold an adaptive spoon when performing the hand to mouth patterns rather than the universal cuff. Pt. Tolerated the strengthening exercises with the cuff weights.  Pt. presents with flexor tone, and tightness in the bilateral elbows, forearms, wrist, and digits. Pt. Presents with active wrist extension, hyperextension at the MP joints, and flexion at the PIP, and DIP joints with attempts to facilitate wrist, and digit extension. Patient tolerated  Pt. Reports 6/10 soreness in the right 4th digit, and 5th digit,  3/10 soreness in the the right 2nd, and 3rd digits. Pt. Reports 3/10 soreness in the left digit PIPs/DIPs with ROM.  Patient continues to work on normalizing tone in the bilateral upper extremities in order to improve range of motion,  and facilitate active range of motion in order to increase engagement in ADLs and IADL tasks.  OT Education - 01/04/21 1535     Education Details BUE functioning    Person(s) Educated Patient    Methods Explanation    Comprehension Verbalized understanding                 OT Long Term Goals - 12/31/20 0843       OT LONG TERM GOAL #1   Title Pt. will improve FOTO score by 2 points for clinically significant improvements during ADLs, and IADLs,    Baseline Eval: FOTO score: 41    Time 12    Period Weeks    Status On-going    Target Date 03/24/21      OT LONG TERM GOAL #2   Title Pt. will improve right shoulder flexion by 10 degrees to assist caregivers with UE  dressing    Baseline Eval: R: 62(94), L: 114(120    Time 12    Period Weeks    Status On-going    Target Date 03/25/21      OT LONG TERM GOAL #3   Title Pt. will improve right Abduction by 10 degrees to assist caregivers with underarm hygiene care.    Baseline Eval: R: 76(93)    Time 12    Period Weeks    Status On-going    Target Date 03/24/21      OT LONG TERM GOAL #4   Title Pt. will demonstrate visual compensatory strategies for navigating through his environment.    Baseline Eval: Limited    Time 12    Period Weeks    Status On-going    Target Date 03/24/21      OT LONG TERM GOAL #5   Title Pt. will demonstrate visual compensatory strategies for tabletop tasks within his near space.    Baseline Eval: Pt. has difficulty    Time 12    Period Weeks    Status On-going    Target Date 03/24/21      OT LONG TERM GOAL #6   Title Pt. will perform hand to mouth patterns with min A in preparation for self-feeding.    Baseline Eval: Pt. has difficulty    Time 12    Period Weeks    Status On-going    Target Date 03/24/21      OT LONG TERM GOAL #7   Title Pt. will perform one step self-grooming tasks with minA.    Baseline Eval: pt. has difficulty    Time 12    Period Weeks    Status On-going    Target Date 03/24/21                   Plan - 01/04/21 1538     Clinical Impression Statement Pt. Reports that he worked on feeding himself Jello at home, and was able to hold an adaptive spoon when performing the hand to mouth patterns rather than the universal cuff. Pt. Tolerated the strengthening exercises with the cuff weights.  Pt. presents with flexor tone, and tightness in the bilateral elbows, forearms, wrist, and digits. Pt. Presents with active wrist extension, hyperextension at the MP joints, and flexion at the PIP, and DIP joints with attempts to facilitate wrist, and digit extension. Patient tolerated  Pt. Reports 6/10 soreness in the right 4th digit, and 5th  digit,  3/10 soreness in the the right 2nd, and 3rd digits. Pt. Reports 3/10 soreness in the left digit PIPs/DIPs with ROM.  Patient continues to work on normalizing tone in the  bilateral upper extremities in order to improve range of motion,  and facilitate active range of motion in order to increase engagement in ADLs and IADL tasks.       OT Occupational Profile and History Detailed Assessment- Review of Records and additional review of physical, cognitive, psychosocial history related to current functional performance    Occupational performance deficits (Please refer to evaluation for details): ADL's;IADL's    Body Structure / Function / Physical Skills ADL;FMC;ROM;Dexterity;IADL;Endurance;Obesity;Strength;UE functional use    Rehab Potential Good    Clinical Decision Making Multiple treatment options, significant modification of task necessary    Comorbidities Affecting Occupational Performance: Presence of comorbidities impacting occupational performance    Modification or Assistance to Complete Evaluation  Max significant modification of tasks or assist is necessary to complete    OT Frequency 2x / week    OT Duration 12 weeks    OT Treatment/Interventions Self-care/ADL training;Therapeutic exercise;Neuromuscular education;Visual/perceptual remediation/compensation;Patient/family education;Therapeutic activities;DME and/or AE instruction;Passive range of motion;Moist Heat;Electrical Stimulation    Consulted and Agree with Plan of Care Patient             Patient will benefit from skilled therapeutic intervention in order to improve the following deficits and impairments:   Body Structure / Function / Physical Skills: ADL, FMC, ROM, Dexterity, IADL, Endurance, Obesity, Strength, UE functional use       Visit Diagnosis: Muscle weakness (generalized)    Problem List Patient Active Problem List   Diagnosis Date Noted   Sepsis due to vancomycin resistant Enterococcus species  (HCC) 06/06/2019   SIRS (systemic inflammatory response syndrome) (HCC) 06/05/2019   Acute lower UTI 06/05/2019   VRE (vancomycin-resistant Enterococci) infection 06/05/2019   Anemia 06/05/2019   Skin ulcer of sacrum with necrosis of muscle (HCC)    Urinary retention    Type 2 diabetes mellitus without complication, with long-term current use of insulin (HCC)    Tachycardia    Lower extremity edema    Acute metabolic encephalopathy    Obstructive sleep apnea    Morbid obesity with BMI of 60.0-69.9, adult (HCC)    Goals of care, counseling/discussion    Palliative care encounter    Sepsis (HCC) 04/27/2019   H/O insulin dependent diabetes mellitus 04/27/2019   History of CVA with residual deficit 04/27/2019   Seizure disorder (HCC) 04/27/2019   Decubitus ulcer of sacral region, stage 4 (HCC) 04/27/2019    Olegario Messier, MS, OTR/L 01/04/2021, 4:01 PM  [facility] MAIN [facility] SERVICES [address] [place], [place], [address] Phone: [phone]   Fax:  [phone]  Name: Paul Hall MRN: [id number] Date of Birth: 06-24-1995

## 2021-01-04 NOTE — Therapy (Signed)
[facility] MAIN [facility] SERVICES [address] [place], [place], [address] Phone: [phone]   Fax:  [phone]  Speech Language Pathology Treatment  Patient Details  Name: Paul Hall MRN: [id number] Date of Birth: 08/19/1995 Referring Provider (SLP): Lenise Herald   Encounter Date: 01/04/2021   End of Session - 01/04/21 1351     Visit Number 5    Number of Visits 25    Date for SLP Re-Evaluation 03/09/21    Authorization Type [employer]    Authorization Time Period 12/15/2020 thru 16/09/2020    Authorization - Visit Number 5    Authorization - Number of Visits 12    Progress Note Due on Visit 10    SLP Start Time 1100    SLP Stop Time  1200    SLP Time Calculation (min) 60 min    Activity Tolerance Patient tolerated treatment well             Past Medical History:  Diagnosis Date   Diabetes mellitus (HCC)    Hypertension    Stroke [facility])     Past Surgical History:  Procedure Laterality Date   IVC FILTER PLACEMENT ([facility] HX)     LEG SURGERY     PEG TUBE PLACEMENT     TRACHEOSTOMY      There were no vitals filed for this visit.   Subjective Assessment - 01/04/21 1347     Subjective pt emotional, states "PT was very overwhelming"    Patient is accompained by: Family member    Currently in Pain? No/denies                   ADULT SLP TREATMENT - 01/04/21 0001       Treatment Provided   Treatment provided Cognitive-Linquistic      Cognitive-Linquistic Treatment   Treatment focused on Cognition;Dysarthria;Patient/family/caregiver education    Skilled Treatment Maximal emotional and cognitive support provided for pt to identify one thing that was overhwelming about PT evaluation, reason why the stated thing was overwhelming, developing an alternate thought/focus to over barrier of fear              SLP Education - 01/04/21 1350     Education Details ways to identify and  resolve/overcome cognitive communication/emotional barriers    Person(s) Educated Patient;Parent(s)    Methods Explanation;Demonstration;Verbal cues    Comprehension Verbalized understanding;Returned demonstration;Need further instruction              SLP Short Term Goals - 01/01/21 0744       SLP SHORT TERM GOAL #1   Title With moderate cues, pt will increase vocal intensity to achieve > 95% speech intelligibility at the sentence level in a mildly noisey environment.    Time 10    Period --   sessions   Status On-going      SLP SHORT TERM GOAL #2   Title Pt will comprehend list of current medications to be able to answer yes/no questions with 755 accuracy given minimal assistance.    Baseline goal revised to address functional deficits    Time 10    Period --   sessions   Status Revised      SLP SHORT TERM GOAL #3   Title After listening to functional information, pt will recall 2 pieces of functional information with 50% accuracy given minimal assistance.    Baseline goal revised to address functional deficits  Time 10    Period --   sessions   Status Revised      SLP SHORT TERM GOAL #4   Title With moderate cues, pt will perform basic mental manipulation activities with 75% accuracy.    Time 10    Period --   sessions   Status On-going              SLP Long Term Goals - 01/01/21 0747       SLP LONG TERM GOAL #1   Title Pt will utilize speech intelligibility strategies to achieve > 95% intelligibility at the sentence level in a mildly noisy environment.    Time 12    Period Weeks    Status On-going    Target Date 03/09/21      SLP LONG TERM GOAL #2   Title Pt will comprehend his list of medications in order to recall 6 medications with minimal assistance.    Baseline revised on 01/01/2021 to address functional deficits    Time 12    Period Weeks    Status Revised    Target Date 03/09/21      SLP LONG TERM GOAL #3   Title Pt will verbally generate  solutions to problems encountered in everyday living tasks with moderate assistance.    Baseline new goal added to address functional deficits    Time 12    Period Weeks    Status New    Target Date 03/09/21              Plan - 01/04/21 1351     Clinical Impression Statement Pt presents with moderate cognitive communication deficits that are further complicated by deficits in vision and bilateral upper extremity weakness. As a result of these deficits, pt arrived to ST session distraught, emotional and fearful following PT evaluation. With maximal assistance from SLP, pt able to identify that he was not comfort in his current position in his chair following PT. SLP used hoyer lift to reposition pt. Plan made for pt to request use of hoyer after performing activities in PT. With maximal assistance, pt able to identify 1 concept/activity within PT evaluation that invoked an emotional barrier for pt. Multiple strategies identified to help pt overcome emotional barriers. while pt made progress during today's session, he required extensive assistance in identifying barriers to functional cognitive communication independence. Therefore, continue to recommend skilled ST intervention is required to target these deficits to increase pt's functional independence and reduce caregiver burden.    Speech Therapy Frequency 2x / week    Duration 12 weeks    Treatment/Interventions Language facilitation;Cueing hierarchy;Internal/external aids;Functional tasks;SLP instruction and feedback;Patient/family education;Cognitive reorganization;Compensatory strategies    Potential to Achieve Goals Fair    Potential Considerations Severity of impairments;Other (comment);Co-morbidities    SLP Home Exercise Plan provided, see pt instructions section    Consulted and Agree with Plan of Care Patient;Family member/caregiver    Family Member Consulted pt's dad, pt's mother             Patient will benefit from  skilled therapeutic intervention in order to improve the following deficits and impairments:   Cognitive communication deficit  History of CVA with residual deficit    Problem List Patient Active Problem List   Diagnosis Date Noted   Sepsis due to vancomycin resistant Enterococcus species (HCC) 06/06/2019   SIRS (systemic inflammatory response syndrome) (HCC) 06/05/2019   Acute lower UTI 06/05/2019   VRE (vancomycin-resistant Enterococci) infection 06/05/2019  Anemia 06/05/2019   Skin ulcer of sacrum with necrosis of muscle (HCC)    Urinary retention    Type 2 diabetes mellitus without complication, with long-term current use of insulin (HCC)    Tachycardia    Lower extremity edema    Acute metabolic encephalopathy    Obstructive sleep apnea    Morbid obesity with BMI of 60.0-69.9, adult (HCC)    Goals of care, counseling/discussion    Palliative care encounter    Sepsis (HCC) 04/27/2019   H/O insulin dependent diabetes mellitus 04/27/2019   History of CVA with residual deficit 04/27/2019   Seizure disorder (HCC) 04/27/2019   Decubitus ulcer of sacral region, stage 4 (HCC) 04/27/2019   Sravya Grissom B. Dreama Saa M.S., CCC-SLP, [facility] Speech-Language Pathologist Rehabilitation Services Office [phone]  Reuel Derby 01/04/2021, 1:59 PM  [facility] MAIN [facility] SERVICES [address] [place], [place], [address] Phone: [phone]   Fax:  [phone]   Name: Paul Hall MRN: [id number] Date of Birth: 1995/05/23

## 2021-01-04 NOTE — Therapy (Signed)
[facility] MAIN [facility] SERVICES [address] [place], [place], [address] Phone: [phone]   Fax:  [phone]  Physical Therapy Evaluation  Patient Details  Name: Paul Hall MRN: [id number] Date of Birth: 05-20-1995 Referring Provider (PT): Paul Herald, PA-C  Encounter Date: 01/04/2021   PT End of Session - 01/04/21 1013     Visit Number 1    Number of Visits 25    Date for PT Re-Evaluation 03/29/21    Authorization Type [employer] and [employer]- Need auth past 12th visit    Authorization - Visit Number 1    Authorization - Number of Visits 12    Progress Note Due on Visit 10    PT Start Time 1000    PT Stop Time 1100    PT Time Calculation (min) 60 min    Equipment Utilized During Treatment Gait belt    Activity Tolerance Patient tolerated treatment well;Other (comment)   Patient was emotional and nervous to attempt new/challenging activities.   Behavior During Therapy Anxious             Past Medical History:  Diagnosis Date   Diabetes mellitus (HCC)    Hypertension    Stroke [facility])     Past Surgical History:  Procedure Laterality Date   IVC FILTER PLACEMENT ([facility] HX)     LEG SURGERY     PEG TUBE PLACEMENT     TRACHEOSTOMY      Vitals:   01/04/21 1017  BP: 138/71  Pulse: 90  Resp: 18  SpO2: 99%  Weight: (!) 320 lb (145.2 kg)  Height: 5\' 7"  (1.702 m)      Subjective Assessment - 01/04/21 1011     Subjective Patient reports he has been in W/c and non ambulatory since having CVA's. He reports he has received some Home health PT to try to build his strength int he home - states he has tried to stand some and compliant with some seated exercises.    Patient is accompained by: Family member   Mom- Paul Hall; Dad= Paul Hall   Pertinent History Patient is a 25 y.o. male with insulin dependent diabetes and morbid obesity (BMI 65) who was admitted with ARDS in the setting of Covid-19 On10/18/2020 History taken  per admission note dated 01/20/2019. On 11/13, the patient was noticed to have RUE twitching. A noncontrast head CT was done and discovered an ischemic stroke and a subdural hemorrhage with slight midline shift. ICP management was initiated by keeping Na > 145 (on 267ml/hr normal saline) and head of the bed > 90 degrees. Neurosurgery was consulted and does not recommend surgical decompression at this time. His heparin gtt that was being used to treat his bilateral DVTs was stopped and vascular surgery was consulted to place and IVC filter on 11/14. On 11/15 he was transferred back to the MICU after his CVAs did not worsen or evolve. He was not able to be weaned from the vent and underwent trach placement on 11/18 and G-tube placement on 11/19.    On 11/26 patient was noted to have "inward eye deviation" and "downward eye deviation with occasional twitch in the left eye" per nursing, concerning for seizure-like activity. He was restarted on cvEEG today and continued to Keppra 1 g BID. CT head wo contrast was obtained which showed new left frontal IPH with 1 cm midline shift and subfalcine herniation, and prior large left hemispheric infarct. . The patient's neurological exam has remained stable  with brainstem reflexes intact, but functionally quadriplegic. with inward/downward eye deviation on 11/26 while on maintenance Keppra. There is no pathological intracranial focality in epilepsy that would make the patient's gaze present in such a manner. There was mild dysconjugate gaze with the right eye out, however, this was not fixed, there was no rhythmic, stereotyped movements to suggest seizures. cvEEG monitoring 11/27-11/28 revealed no seizures only focal slowing consistent with his known vascular insult. There was a push button event for extremity shaking that had no ccvEEG correlate.  - Keppra 2 g BID.     Subacute ischemic stroke (left, ACA/MCA/PCA distribution)  Likely secondary to COVID hypercoagulable state.  Carotid Dopplers negative. Stroke likely subacute given 3 serial CTs demonstrated no evolution of stroke size. Will get statin once CK/rhabdomyolysis and abnl LFTs resolve. 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    Limitations Lifting;Standing;Walking;Writing;Reading;Sitting;House hold activities    How long can you sit comfortably? Patient reports uable to sit up long < 4 hours due to buttock discomfort    How long can you stand comfortably? Patient is currently unable to stand    How long can you walk comfortably? Patient is currently unable to walk    Diagnostic tests FINDINGS:  Brain: Left cerebral convexity cortical hypodensity with associated  cortical calcification extending from the frontal to the parietal  lobe. Extensive area of volume loss and low-density compatible with  chronic infarction. This is noted on prior reports from [facility].  Probable chronic watershed infarct. Additional small areas of  hypodensity in the right frontal white matter consistent with  chronic ischemia.     Negative for acute infarct. No acute hemorrhage or mass. Ventricle  size normal without midline shift.     Vascular: Normal arterial flow voids.     Skull: Negative     Sinuses/Orbits: Mucosal edema paranasal sinuses with air-fluid level  left sphenoid sinus. Negative orbit     Other: None     IMPRESSION:  Extensive watershed type infarct over the left cerebral convexity  with cortical calcification. This appears chronic. No associated  hemorrhage, hydrocephalus, or midline shift.     These results were called by telephone at the time of interpretation  on 05/09/2019 at 3:12 pm to provider Paul Hall , who verbally  acknowledged these results.        Electronically Signed    By: Paul Hall M.D.    On: 05/09/2019 15:13       Result History    CT HEAD WO CONTRAST (Order #[id number]) on 05/09/2019 - Order Result History Report    Patient Stated Goals I want to be able to possibly walk  again    Currently in Pain? No/denies              [facility] PT Assessment - 01/04/21 1616       Assessment   Medical Diagnosis CVA/COVID    Referring Provider (PT) Paul Herald, PA-C    Onset Date/Surgical Date 01/08/19    Hand Dominance Right    Prior Therapy Home health      Precautions   Precautions Fall      Restrictions   Weight Bearing Restrictions No      Balance Screen   Has the patient fallen in the past 6 months No    Has the patient had a decrease in activity level because of a fear of falling?  Yes    Is the patient reluctant to leave  their home because of a fear of falling?  Yes      Home Environment   Living Environment Private residence    Living Arrangements Parent   Mom and Dad ([name]/Paul Hall)   Available Help at Discharge Family    Type of Home House    Home Access Level entry    Home Layout Two level   Patient reports he stays on main level   Home Equipment Wheelchair - power;Hospital bed;Other (comment)   Lift chair   Additional Comments Hoyer lift; sit to stand lift      Prior Function   Level of Independence Independent    Vocation Full time employment    Leisure drums, Watch TV      Cognition   Overall Cognitive Status Impaired/Different from baseline    Area of Impairment Orientation;Attention;Memory;Following commands;Problem solving    Orientation Level --   Oriented to name, DOB, Situation   Current Attention Level Sustained    Memory Decreased short-term memory    Following Commands Follows one step commands consistently    Problem Solving Slow processing;Difficulty sequencing;Requires verbal cues;Requires tactile cues    Attention Selective    Selective Attention Impaired    Selective Attention Impairment Verbal basic;Functional basic    Memory Impaired    Memory Impairment Decreased recall of new information;Decreased short term memory    Decreased Short Term Memory Verbal complex;Functional complex    Awareness Appears intact     Problem Solving Impaired    Problem Solving Impairment Verbal complex      Observation/Other Assessments   Skin Integrity No open wounds      Sensation   Light Touch Appears Intact               OBJECTIVE  Posture Forward head; protracted shoulders    Gait Unable to walk or stand  Strength R/L 2-/2-Hip flexion 2-/2-Hip external rotation 2-/2- Hip internal rotation 2-/2-Hip extension  2-/2-Hip abduction 2-/2-Hip adduction 3-/3-Knee extension 2-/2- Knee flexion 1/1 Ankle Plantarflexion 1/1 Ankle Dorsiflexion   NEUROLOGICAL:  Mental Status Patient is oriented to person, place and time.  Recent memory is intact.  Remote memory is intact.   Sensation Grossly intact to light touch bilateral UEs/LEs as determined by testing dermatomes C2-T2/L2-S2 respectively Proprioception and hot/cold testing deferred on this date  Coordination/Cerebellar Finger to Nose: impaired Heel to Shin: unable Rapid alternating movements: impaired Finger Opposition: impaired   FUNCTIONAL OUTCOME MEASURES   Results Comments  FOT0 Will assess next visit                                        ASSESSMENT Clinical Impression: Pt is a pleasant 25 year-old male referred for weakness and immobility associated with past CVAs.  PT examination reveals deficits . Pt presents with deficits in Upper body and lower body strength and inability to stand or walk (chairbound). Patient presents with limited UE functional use and increased overall anxiety regarding attempting mobility or new activities.  Pt will benefit from skilled PT services to address deficits in strength and mobility to  decrease risk for future falls and improve overall functional strength for improved overall quality of life.                    Objective measurements completed on examination: See above findings.  PT Education - 01/04/21 1012     Education Details PT plan  of care    Person(s) Educated Patient    Methods Explanation;Verbal cues    Comprehension Verbalized understanding;Verbal cues required;Need further instruction              PT Short Term Goals - 01/04/21 1505       PT SHORT TERM GOAL #1   Title Pt will be independent with HEP in order to improve strength and balance in order to decrease fall risk and improve function at home and work.    Baseline 01/04/2021= No formal HEP in place.    Time 6    Period Weeks    Status New    Target Date 02/15/21               PT Long Term Goals - 01/04/21 1506       PT LONG TERM GOAL #1   Title Patient will increase BLE gross strength by 1/2 muscle grade to improve functional strength for improved independence with potential gait, increased standing tolerance and increased ADL ability.    Baseline 01/04/2021- Patient presents with 1/5 to 3-/5 B LE strength with MMT.    Time 12    Period Weeks    Status New    Target Date 03/29/21      PT LONG TERM GOAL #2   Title Patient will tolerate sitting unsupported demonstrating erect sitting posture for 15 minutes with CGA to demonstrate improved back extensor strength and improved sitting tolerance.    Baseline 01/04/2021- Patient confied to sitting in lift chair or electric power chair with back support and unable to sit upright without physical assistance.    Time 12    Period Weeks    Status New    Target Date 03/29/21      PT LONG TERM GOAL #3   Title Patient will demonstrate ability to perform static standing in // bars > 2 min with Max Assist  without loss of balance and fair posture for improved overall strength for pre-gait and transfer activities.    Baseline 01/04/2021= Patient current uanble to stand- Dependent on hoyer or sit to stand lift for transfers.    Time 12    Period Weeks    Status New    Target Date 03/29/21                    Plan - 01/04/21 1448     Clinical Impression Statement Pt is a pleasant 25  year-old male referred for weakness and immobility associated with past CVAs.  PT examination reveals deficits . Pt presents with deficits in Upper body and lower body strength and inability to stand or walk (chairbound). Patient presents with limited UE functional use and increased overall anxiety regarding attempting mobility or new activities.  Pt will benefit from skilled PT services to address deficits in strength and mobility to  decrease risk for future falls and improve overall functional strength for improved overall quality of life.    Personal Factors and Comorbidities Comorbidity 3+;Time since onset of injury/illness/exacerbation    Comorbidities CVA, diabetes, Seizures    Examination-Activity Limitations Bathing;Bed Mobility;Bend;Caring for Others;Carry;Dressing;Hygiene/Grooming;Lift;Locomotion Level;Reach Overhead;Self Feeding;Sit;Squat;Stairs;Stand;Transfers;Toileting    Examination-Participation Restrictions Cleaning;Community Activity;Driving;Laundry;Medication Management;Meal Prep;Occupation;Personal Finances;Shop;Yard Work;Volunteer    Stability/Clinical Decision Making Evolving/Moderate complexity    Clinical Decision Making Moderate    Rehab Potential Fair    PT Frequency 2x / week    PT Duration 12  weeks    PT Treatment/Interventions ADLs/Self Care Home Management;Cryotherapy;Electrical Stimulation;Moist Heat;Ultrasound;DME Instruction;Gait training;Stair training;Functional mobility training;Therapeutic exercise;Balance training;Patient/family education;Orthotic Fit/Training;Neuromuscular re-education;Wheelchair mobility training;Manual techniques;Passive range of motion;Dry needling;Energy conservation;Taping;Visual/perceptual remediation/compensation;Joint Manipulations    PT Next Visit Plan Instruct in home program exercises; transfer assessment when appropriate; Assess standing    PT Home Exercise Plan To be initiated next visit.    Recommended Other Services Patient already  receiving ST and OT    Consulted and Agree with Plan of Care Patient;Family member/caregiver    Family Member Consulted Mom- Paul Hall and Dad- [name]             Patient will benefit from skilled therapeutic intervention in order to improve the following deficits and impairments:  Abnormal gait, Decreased activity tolerance, Decreased balance, Decreased cognition, Cardiopulmonary status limiting activity, Decreased coordination, Decreased endurance, Decreased knowledge of use of DME, Decreased mobility, Decreased range of motion, Decreased safety awareness, Decreased strength, Difficulty walking, Hypomobility, Impaired perceived functional ability, Impaired flexibility, Impaired UE functional use, Impaired vision/preception, Improper body mechanics, Postural dysfunction, Obesity  Visit Diagnosis: Difficulty in walking, not elsewhere classified  Muscle weakness (generalized)  Unsteadiness on feet     Problem List Patient Active Problem List   Diagnosis Date Noted   Sepsis due to vancomycin resistant Enterococcus species (HCC) 06/06/2019   SIRS (systemic inflammatory response syndrome) (HCC) 06/05/2019   Acute lower UTI 06/05/2019   VRE (vancomycin-resistant Enterococci) infection 06/05/2019   Anemia 06/05/2019   Skin ulcer of sacrum with necrosis of muscle (HCC)    Urinary retention    Type 2 diabetes mellitus without complication, with long-term current use of insulin (HCC)    Tachycardia    Lower extremity edema    Acute metabolic encephalopathy    Obstructive sleep apnea    Morbid obesity with BMI of 60.0-69.9, adult (HCC)    Goals of care, counseling/discussion    Palliative care encounter    Sepsis (HCC) 04/27/2019   H/O insulin dependent diabetes mellitus 04/27/2019   History of CVA with residual deficit 04/27/2019   Seizure disorder (HCC) 04/27/2019   Decubitus ulcer of sacral region, stage 4 (HCC) 04/27/2019    Lenda Kelp, PT 01/04/2021, 4:22 PM  [facility] MAIN [facility] SERVICES [address] [place], [place], [address] Phone: [phone]   Fax:  [phone]  Name: Paul Hall MRN: [id number] Date of Birth: September 05, 1995

## 2021-01-06 ENCOUNTER — Ambulatory Visit: Payer: BC Managed Care – PPO

## 2021-01-06 ENCOUNTER — Ambulatory Visit: Payer: BC Managed Care – PPO | Admitting: Speech Pathology

## 2021-01-06 ENCOUNTER — Other Ambulatory Visit: Payer: Self-pay

## 2021-01-06 DIAGNOSIS — R278 Other lack of coordination: Secondary | ICD-10-CM

## 2021-01-06 DIAGNOSIS — I6389 Other cerebral infarction: Secondary | ICD-10-CM | POA: Diagnosis not present

## 2021-01-06 DIAGNOSIS — R269 Unspecified abnormalities of gait and mobility: Secondary | ICD-10-CM | POA: Diagnosis not present

## 2021-01-06 DIAGNOSIS — R2681 Unsteadiness on feet: Secondary | ICD-10-CM | POA: Diagnosis not present

## 2021-01-06 DIAGNOSIS — M6281 Muscle weakness (generalized): Secondary | ICD-10-CM

## 2021-01-06 DIAGNOSIS — R471 Dysarthria and anarthria: Secondary | ICD-10-CM | POA: Diagnosis not present

## 2021-01-06 DIAGNOSIS — I6930 Unspecified sequelae of cerebral infarction: Secondary | ICD-10-CM

## 2021-01-06 DIAGNOSIS — R41841 Cognitive communication deficit: Secondary | ICD-10-CM

## 2021-01-06 DIAGNOSIS — G4733 Obstructive sleep apnea (adult) (pediatric): Secondary | ICD-10-CM | POA: Diagnosis not present

## 2021-01-06 DIAGNOSIS — L8994 Pressure ulcer of unspecified site, stage 4: Secondary | ICD-10-CM | POA: Diagnosis not present

## 2021-01-06 DIAGNOSIS — R262 Difficulty in walking, not elsewhere classified: Secondary | ICD-10-CM | POA: Diagnosis not present

## 2021-01-06 NOTE — Therapy (Signed)
[facility] MAIN [facility] SERVICES [address] [place], [place], [address] Phone: [phone]   Fax:  [phone]  Occupational Therapy Treatment  Patient Details  Name: Paul Hall MRN: [id number] Date of Birth: 11-14-1995 Referring Provider (OT): Lenise Herald   Encounter Date: 01/06/2021   OT End of Session - 01/06/21 1317     Visit Number 6    Number of Visits 24    Date for OT Re-Evaluation 03/24/21    Authorization Type Progress report period starting 12/15/2020    OT Start Time 1100    OT Stop Time 1145    OT Time Calculation (min) 45 min    Equipment Utilized During Treatment tilt in space power wc    Activity Tolerance Patient tolerated treatment well    Behavior During Therapy WFL for tasks assessed/performed             Past Medical History:  Diagnosis Date   Diabetes mellitus (HCC)    Hypertension    Stroke [facility])     Past Surgical History:  Procedure Laterality Date   IVC FILTER PLACEMENT ([facility] HX)     LEG SURGERY     PEG TUBE PLACEMENT     TRACHEOSTOMY      There were no vitals filed for this visit.   Subjective Assessment - 01/06/21 1311     Subjective  Pt reports doing well today.    Patient is accompanied by: Family member    Pertinent History Pt. is a 25 y.o. male who was hospitalized, and diagnosed with COVID-19, sustained a CVA with resultant quadreplegia in 01/2019. Pt. was hospitalized with VRE  UTI. Pt. PMHx includes: urinary retention, Seizure Disorder, Obstructive Sleep Disorder, DM Type II, and Morbid obesity. Pt. resides at home with his parents.    Limitations BUE, and LE limitations with ROM, strength, motor control, and [facility] skills.    Patient Stated Goals To improve his arms, and hands.    Currently in Pain? Yes    Pain Score 5     Pain Location Buttocks    Pain Descriptors / Indicators Aching;Sore;Tender    Pain Type Chronic pain    Pain Onset More than a month ago    Pain  Frequency Intermittent    Aggravating Factors  prolonged sitting    Effect of Pain on Daily Activities frequent repositional changes needed    Multiple Pain Sites Yes    Pain Score 3    Pain Location Hand    Pain Orientation Right;Left    Pain Descriptors / Indicators Tightness    Pain Type Chronic pain    Pain Onset More than a month ago    Pain Frequency Intermittent    Aggravating Factors  end range stretch/hypertonicity    Pain Relieving Factors rest, heat    Effect of Pain on Daily Activities limited ability to grasp/release ADL supplies d/t high tone in hands           Occupational Therapy Treatment: Manual Therapy: Moist heat applied to R/L hands x  5 min for pain reduction/muscle relaxation in prep for passive stretching for bilat wrist and digit ext, working to increase ROM for self care performance.  Performed slow, prolonged passive stretching for bilat wrist extension, MP, PIP, and DID digit extension with good tolerance.  Reinforced importance of daily passive stretching by caregivers in order to reduce risk of contracture and maximize ROM for more active movement.  Passive stretching completed for bilat  elbow extension, shoulder flex/abd/ER, working to increase functional range for ADLs.  Therapeutic Exercise: Participated in [facility] for R/L shoulders targeting functional movement patterns for UB ADLs.  Worked AAROM to achieve overhead/side/ipsilateral reach toward axillary region, forward reaching, reach behind head.  OT helping to achieve end ROM to maximize shoulder mobility for self care.   Performed 2 sets 10 reps of each with rest breaks needed between R/L sides and each set.   Response to Treatment: Minimal pain reported today in bilat hands during passive digit and wrist extension.  Good tolerance to bilat shoulder AAROM with frequent rest breaks.  Bilat shoulders are stiff and weak which limit UB ADL performance and will continue to be addressed in OT sessions.  OT  sessions will also continue to work towards reducing flexor tone in BUEs in order to improve functional use of BUEs for self care.    OT Education - 01/06/21 1317     Education Details Ensure caregiver completion of passive stretching for bilat hands daily in order to reduce stiffness/prevent contractures    Person(s) Educated Patient    Methods Explanation    Comprehension Verbalized understanding;Verbal cues required;Need further instruction                 OT Long Term Goals - 12/31/20 0843       OT LONG TERM GOAL #1   Title Pt. will improve FOTO score by 2 points for clinically significant improvements during ADLs, and IADLs,    Baseline Eval: FOTO score: 41    Time 12    Period Weeks    Status On-going    Target Date 03/24/21      OT LONG TERM GOAL #2   Title Pt. will improve right shoulder flexion by 10 degrees to assist caregivers with UE dressing    Baseline Eval: R: 62(94), L: 114(120    Time 12    Period Weeks    Status On-going    Target Date 03/25/21      OT LONG TERM GOAL #3   Title Pt. will improve right Abduction by 10 degrees to assist caregivers with underarm hygiene care.    Baseline Eval: R: 76(93)    Time 12    Period Weeks    Status On-going    Target Date 03/24/21      OT LONG TERM GOAL #4   Title Pt. will demonstrate visual compensatory strategies for navigating through his environment.    Baseline Eval: Limited    Time 12    Period Weeks    Status On-going    Target Date 03/24/21      OT LONG TERM GOAL #5   Title Pt. will demonstrate visual compensatory strategies for tabletop tasks within his near space.    Baseline Eval: Pt. has difficulty    Time 12    Period Weeks    Status On-going    Target Date 03/24/21      OT LONG TERM GOAL #6   Title Pt. will perform hand to mouth patterns with min A in preparation for self-feeding.    Baseline Eval: Pt. has difficulty    Time 12    Period Weeks    Status On-going    Target Date  03/24/21      OT LONG TERM GOAL #7   Title Pt. will perform one step self-grooming tasks with minA.    Baseline Eval: pt. has difficulty    Time 12    Period  Weeks    Status On-going    Target Date 03/24/21                   Plan - 01/06/21 1338     Clinical Impression Statement Minimal pain reported today in bilat hands during passive digit and wrist extension.  Good tolerance to bilat shoulder AAROM with frequent rest breaks.  Bilat shoulders are stiff and weak which limit UB ADL performance and will continue to be addressed in OT sessions.  OT sessions will also continue to work towards reducing flexor tone in BUEs in order to improve functional use of BUEs for self care.    OT Occupational Profile and History Detailed Assessment- Review of Records and additional review of physical, cognitive, psychosocial history related to current functional performance    Occupational performance deficits (Please refer to evaluation for details): ADL's;IADL's    Body Structure / Function / Physical Skills ADL;FMC;ROM;Dexterity;IADL;Endurance;Obesity;Strength;UE functional use    Rehab Potential Good    Clinical Decision Making Multiple treatment options, significant modification of task necessary    Comorbidities Affecting Occupational Performance: Presence of comorbidities impacting occupational performance    Modification or Assistance to Complete Evaluation  Max significant modification of tasks or assist is necessary to complete    OT Frequency 2x / week    OT Duration 12 weeks    OT Treatment/Interventions Self-care/ADL training;Therapeutic exercise;Neuromuscular education;Visual/perceptual remediation/compensation;Patient/family education;Therapeutic activities;DME and/or AE instruction;Passive range of motion;Moist Heat;Electrical Stimulation    Consulted and Agree with Plan of Care Patient             Patient will benefit from skilled therapeutic intervention in order to  improve the following deficits and impairments:   Body Structure / Function / Physical Skills: ADL, FMC, ROM, Dexterity, IADL, Endurance, Obesity, Strength, UE functional use       Visit Diagnosis: Muscle weakness (generalized)  History of CVA with residual deficit  Other lack of coordination    Problem List Patient Active Problem List   Diagnosis Date Noted   Sepsis due to vancomycin resistant Enterococcus species (HCC) 06/06/2019   SIRS (systemic inflammatory response syndrome) (HCC) 06/05/2019   Acute lower UTI 06/05/2019   VRE (vancomycin-resistant Enterococci) infection 06/05/2019   Anemia 06/05/2019   Skin ulcer of sacrum with necrosis of muscle (HCC)    Urinary retention    Type 2 diabetes mellitus without complication, with long-term current use of insulin (HCC)    Tachycardia    Lower extremity edema    Acute metabolic encephalopathy    Obstructive sleep apnea    Morbid obesity with BMI of 60.0-69.9, adult (HCC)    Goals of care, counseling/discussion    Palliative care encounter    Sepsis (HCC) 04/27/2019   H/O insulin dependent diabetes mellitus 04/27/2019   History of CVA with residual deficit 04/27/2019   Seizure disorder (HCC) 04/27/2019   Decubitus ulcer of sacral region, stage 4 (HCC) 04/27/2019   Danelle Earthly, MS, OTR/L  Otis Dials, OT/L 01/06/2021, 1:39 PM  [facility] MAIN [facility] SERVICES [address] [place], [place], [address] Phone: [phone]   Fax:  [phone]  Name: Dariusz Brase MRN: [id number] Date of Birth: May 12, 1995

## 2021-01-07 NOTE — Patient Instructions (Signed)
Complete addition, subtraction, general knowledge subsets on TalkPath Therapy App

## 2021-01-07 NOTE — Therapy (Signed)
[place] MAIN [facility] SERVICES [address] [place], [place], [address] Phone: [phone]   Fax:  [phone]  Speech Language Pathology Treatment  Patient Details  Name: Paul Hall MRN: [id number] Date of Birth: 10-Oct-1995 Referring Provider (SLP): Lenise Herald   Encounter Date: 01/06/2021   End of Session - 01/07/21 0813     Visit Number 6    Number of Visits 25    Date for SLP Re-Evaluation 03/09/21    Authorization Type [employer] [place] [employer]    Authorization Time Period 12/15/2020 thru 16/09/2020    Authorization - Visit Number 6    Authorization - Number of Visits 12    Progress Note Due on Visit 10    SLP Start Time 1000    SLP Stop Time  1100    SLP Time Calculation (min) 60 min    Activity Tolerance Patient tolerated treatment well             Past Medical History:  Diagnosis Date   Diabetes mellitus (HCC)    Hypertension    Stroke [facility])     Past Surgical History:  Procedure Laterality Date   IVC FILTER PLACEMENT ([facility] HX)     LEG SURGERY     PEG TUBE PLACEMENT     TRACHEOSTOMY      There were no vitals filed for this visit.   Subjective Assessment - 01/07/21 0808     Subjective "I am doing really well"    Patient is accompained by: Family member    Currently in Pain? No/denies                   ADULT SLP TREATMENT - 01/07/21 0001       Treatment Provided   Treatment provided Cognitive-Linquistic      Cognitive-Linquistic Treatment   Treatment focused on Cognition;Dysarthria;Patient/family/caregiver education    Skilled Treatment COGNITION: [employer]us Therapy app on iPad used to target multiple skill areas: Addition level 1 - 100% with extra time for response, level 2 - 50% accuracy with more than a reasonable amount of time for response; General Knowledge - level 1 - 90%; USING WORD LISTS IN SENTENCES (HELP for Memory - p72) word presented verbally - 75%               SLP Education - 01/07/21 0812     Education Details TalkPath Therapy App    Person(s) Educated Patient;Parent(s)    Methods Explanation;Demonstration;Verbal cues;Handout    Comprehension Verbalized understanding;Returned demonstration;Need further instruction              SLP Short Term Goals - 01/01/21 0744       SLP SHORT TERM GOAL #1   Title With moderate cues, pt will increase vocal intensity to achieve > 95% speech intelligibility at the sentence level in a mildly noisey environment.    Time 10    Period --   sessions   Status On-going      SLP SHORT TERM GOAL #2   Title Pt will comprehend list of current medications to be able to answer yes/no questions with 755 accuracy given minimal assistance.    Baseline goal revised to address functional deficits    Time 10    Period --   sessions   Status Revised      SLP SHORT TERM GOAL #3   Title After listening to functional information, pt will recall 2 pieces of functional information with  50% accuracy given minimal assistance.    Baseline goal revised to address functional deficits    Time 10    Period --   sessions   Status Revised      SLP SHORT TERM GOAL #4   Title With moderate cues, pt will perform basic mental manipulation activities with 75% accuracy.    Time 10    Period --   sessions   Status On-going              SLP Long Term Goals - 01/01/21 0747       SLP LONG TERM GOAL #1   Title Pt will utilize speech intelligibility strategies to achieve > 95% intelligibility at the sentence level in a mildly noisy environment.    Time 12    Period Weeks    Status On-going    Target Date 03/09/21      SLP LONG TERM GOAL #2   Title Pt will comprehend his list of medications in order to recall 6 medications with minimal assistance.    Baseline revised on 01/01/2021 to address functional deficits    Time 12    Period Weeks    Status Revised    Target Date 03/09/21      SLP LONG TERM GOAL #3   Title Pt  will verbally generate solutions to problems encountered in everyday living tasks with moderate assistance.    Baseline new goal added to address functional deficits    Time 12    Period Weeks    Status New    Target Date 03/09/21              Plan - 01/07/21 0815     Clinical Impression Statement Pt presents with moderate cognitive communication deficits that are further complicated by deficits in vision and bilateral upper extremity weakness. TalkPath Therapy app introduced and downloaded on pt's iPad. Pt excited about having cognitive tasks to complete at home. Pt with noted difficulty adding 3 numbers together with more time overall to complete all basic addition problems. As such, skilled ST intervention continues to be required to target his moderate cognitive communication impairments in an effort to increase his functional independence and reduce caregiver burden.    Speech Therapy Frequency 2x / week    Duration 12 weeks    Treatment/Interventions Language facilitation;Cueing hierarchy;Internal/external aids;Functional tasks;SLP instruction and feedback;Patient/family education;Cognitive reorganization;Compensatory strategies    Potential to Achieve Goals Fair    Potential Considerations Severity of impairments;Co-morbidities    SLP Home Exercise Plan provided, see pt instructions section    Consulted and Agree with Plan of Care Patient;Family member/caregiver    Family Member Consulted pt's dad             Patient will benefit from skilled therapeutic intervention in order to improve the following deficits and impairments:   Cognitive communication deficit  History of CVA with residual deficit    Problem List Patient Active Problem List   Diagnosis Date Noted   Sepsis due to vancomycin resistant Enterococcus species (HCC) 06/06/2019   SIRS (systemic inflammatory response syndrome) (HCC) 06/05/2019   Acute lower UTI 06/05/2019   VRE (vancomycin-resistant  Enterococci) infection 06/05/2019   Anemia 06/05/2019   Skin ulcer of sacrum with necrosis of muscle (HCC)    Urinary retention    Type 2 diabetes mellitus without complication, with long-term current use of insulin (HCC)    Tachycardia    Lower extremity edema    Acute metabolic encephalopathy  Obstructive sleep apnea    Morbid obesity with BMI of 60.0-69.9, adult (HCC)    Goals of care, counseling/discussion    Palliative care encounter    Sepsis (HCC) 04/27/2019   H/O insulin dependent diabetes mellitus 04/27/2019   History of CVA with residual deficit 04/27/2019   Seizure disorder (HCC) 04/27/2019   Decubitus ulcer of sacral region, stage 4 (HCC) 04/27/2019   Glendell Schlottman B. Dreama Saa M.S., CCC-SLP, [facility] Speech-Language Pathologist Rehabilitation Services Office [phone]  Reuel Derby 01/07/2021, 8:18 AM  [place] [facility] MAIN [facility] SERVICES [address] [place], [place], [address] Phone: [phone]   Fax:  [phone]   Name: Wil Slape MRN: [id number] Date of Birth: 1995-04-24

## 2021-01-11 ENCOUNTER — Other Ambulatory Visit: Payer: Self-pay

## 2021-01-11 ENCOUNTER — Encounter: Payer: Self-pay | Admitting: Occupational Therapy

## 2021-01-11 ENCOUNTER — Ambulatory Visit: Payer: BC Managed Care – PPO | Admitting: Occupational Therapy

## 2021-01-11 ENCOUNTER — Ambulatory Visit: Payer: BC Managed Care – PPO | Admitting: Speech Pathology

## 2021-01-11 DIAGNOSIS — R262 Difficulty in walking, not elsewhere classified: Secondary | ICD-10-CM | POA: Diagnosis not present

## 2021-01-11 DIAGNOSIS — M6281 Muscle weakness (generalized): Secondary | ICD-10-CM

## 2021-01-11 DIAGNOSIS — R2681 Unsteadiness on feet: Secondary | ICD-10-CM | POA: Diagnosis not present

## 2021-01-11 DIAGNOSIS — R471 Dysarthria and anarthria: Secondary | ICD-10-CM | POA: Diagnosis not present

## 2021-01-11 DIAGNOSIS — R278 Other lack of coordination: Secondary | ICD-10-CM

## 2021-01-11 DIAGNOSIS — R41841 Cognitive communication deficit: Secondary | ICD-10-CM | POA: Diagnosis not present

## 2021-01-11 DIAGNOSIS — I6930 Unspecified sequelae of cerebral infarction: Secondary | ICD-10-CM | POA: Diagnosis not present

## 2021-01-11 DIAGNOSIS — R269 Unspecified abnormalities of gait and mobility: Secondary | ICD-10-CM | POA: Diagnosis not present

## 2021-01-11 NOTE — Therapy (Signed)
[place] [facility] MAIN [facility] SERVICES [address] [place], [place], [address] Phone: [phone]   Fax:  [phone]  Occupational Therapy Treatment  Patient Details  Name: Paul Hall MRN: [id number] Date of Birth: Dec 05, 1995 Referring Provider (OT): Lenise Herald   Encounter Date: 01/11/2021   OT End of Session - 01/11/21 1356     Visit Number 7    Number of Visits 24    Date for OT Re-Evaluation 03/24/21    Authorization Type Progress report period starting 12/15/2020    OT Start Time 1015    OT Stop Time 1100    OT Time Calculation (min) 45 min    Activity Tolerance Patient tolerated treatment well    Behavior During Therapy [facility] for tasks assessed/performed             Past Medical History:  Diagnosis Date   Diabetes mellitus (HCC)    Hypertension    Stroke [facility])     Past Surgical History:  Procedure Laterality Date   IVC FILTER PLACEMENT ([facility] HX)     LEG SURGERY     PEG TUBE PLACEMENT     TRACHEOSTOMY     OT TREATMENT    Estim attended:  Pt. tolerated neuromuscular estim  for 8 min., intensity 36, cycle time 10/10,  duty cycle 50%. Applied to the left wrist, and digit extensors with constant monitoring 100% of the time.   herapeutic Exercise:   Patient tolerated active range of motion reps for bilateral shoulder flexion abduction. Passive range of motion for bilateral elbow flexion and extension with slow prolonged gentle stretching for elbow extension, wrist extension, and digit extension. Patient tolerated moist heat modality to his bilateral hands prior to range of motion.   Pt. presents with flexor tone, and tightness in the bilateral elbows, forearms, wrist, and digits which limit functional reaching, and active engagement of the BUEs during ADLs, and IADLs. Patient tolerated range of motion, and stretching to his bilateral elbows, wrists, and digits. Pt. Reports 8/10 soreness in the right 5th digit  PIPs/DIPs with ROM. Pt. continues to present with active wrist, and MP extension, however presents with MP hyperextension with PIP/DIP flexion when attempting active digit MP extension. Pt. tolerated Estim to the left wrist, and digit extensors. May benefit from increasing the intensity next session. Plan to attempt to the right hand if pt. Has less discomfort with the right 4th digit ROM. Patient continues to work on normalizing tone in the bilateral upper extremities in order to improve range of motion,  and facilitate active range of motion in order to increase engagement in ADLs and IADL tasks.                                  OT Education - 01/11/21 1354     Education Details Pt./caregiver education was provided about ROM.    Person(s) Educated Patient    Methods Explanation    Comprehension Verbalized understanding;Verbal cues required;Need further instruction                 OT Long Term Goals - 12/31/20 0843       OT LONG TERM GOAL #1   Title Pt. will improve FOTO score by 2 points for clinically significant improvements during ADLs, and IADLs,    Baseline Eval: FOTO score: 41    Time 12    Period Weeks  Status On-going    Target Date 03/24/21      OT LONG TERM GOAL #2   Title Pt. will improve right shoulder flexion by 10 degrees to assist caregivers with UE dressing    Baseline Eval: R: 62(94), L: 114(120    Time 12    Period Weeks    Status On-going    Target Date 03/25/21      OT LONG TERM GOAL #3   Title Pt. will improve right Abduction by 10 degrees to assist caregivers with underarm hygiene care.    Baseline Eval: R: 76(93)    Time 12    Period Weeks    Status On-going    Target Date 03/24/21      OT LONG TERM GOAL #4   Title Pt. will demonstrate visual compensatory strategies for navigating through his environment.    Baseline Eval: Limited    Time 12    Period Weeks    Status On-going    Target Date 03/24/21      OT LONG  TERM GOAL #5   Title Pt. will demonstrate visual compensatory strategies for tabletop tasks within his near space.    Baseline Eval: Pt. has difficulty    Time 12    Period Weeks    Status On-going    Target Date 03/24/21      OT LONG TERM GOAL #6   Title Pt. will perform hand to mouth patterns with min A in preparation for self-feeding.    Baseline Eval: Pt. has difficulty    Time 12    Period Weeks    Status On-going    Target Date 03/24/21      OT LONG TERM GOAL #7   Title Pt. will perform one step self-grooming tasks with minA.    Baseline Eval: pt. has difficulty    Time 12    Period Weeks    Status On-going    Target Date 03/24/21                   Plan - 01/11/21 1356     Clinical Impression Statement Pt. presents with flexor tone, and tightness in the bilateral elbows, forearms, wrist, and digits which limit functional reaching, and active engagement of the BUEs during ADLs, and IADLs. Patient tolerated range of motion, and stretching to his bilateral elbows, wrists, and digits. Pt. Reports 8/10 soreness in the right 5th digit PIPs/DIPs with ROM. Pt. continues to present with active wrist, and MP extension, however presents with MP hyperextension with PIP/DIP flexion when attempting active digit MP extension. Pt. tolerated Estim to the left wrist, and digit extensors. May benefit from increasing the intensity next session. Plan to attempt to the right hand if pt. Has less discomfort with the right 4th digit ROM. Patient continues to work on normalizing tone in the bilateral upper extremities in order to improve range of motion,  and facilitate active range of motion in order to increase engagement in ADLs and IADL tasks.       OT Occupational Profile and History Detailed Assessment- Review of Records and additional review of physical, cognitive, psychosocial history related to current functional performance    Occupational performance deficits (Please refer to  evaluation for details): ADL's;IADL's    Body Structure / Function / Physical Skills ADL;FMC;ROM;Dexterity;IADL;Endurance;Obesity;Strength;UE functional use    Rehab Potential Good    Clinical Decision Making Multiple treatment options, significant modification of task necessary    Comorbidities Affecting Occupational Performance: Presence  of comorbidities impacting occupational performance    Modification or Assistance to Complete Evaluation  Max significant modification of tasks or assist is necessary to complete    OT Frequency 2x / week    OT Duration 12 weeks    OT Treatment/Interventions Self-care/ADL training;Therapeutic exercise;Neuromuscular education;Visual/perceptual remediation/compensation;Patient/family education;Therapeutic activities;DME and/or AE instruction;Passive range of motion;Moist Heat;Electrical Stimulation    Consulted and Agree with Plan of Care Patient             Patient will benefit from skilled therapeutic intervention in order to improve the following deficits and impairments:   Body Structure / Function / Physical Skills: ADL, FMC, ROM, Dexterity, IADL, Endurance, Obesity, Strength, UE functional use       Visit Diagnosis: Muscle weakness (generalized)  Other lack of coordination    Problem List Patient Active Problem List   Diagnosis Date Noted   Sepsis due to vancomycin resistant Enterococcus species (HCC) 06/06/2019   SIRS (systemic inflammatory response syndrome) (HCC) 06/05/2019   Acute lower UTI 06/05/2019   VRE (vancomycin-resistant Enterococci) infection 06/05/2019   Anemia 06/05/2019   Skin ulcer of sacrum with necrosis of muscle (HCC)    Urinary retention    Type 2 diabetes mellitus without complication, with long-term current use of insulin (HCC)    Tachycardia    Lower extremity edema    Acute metabolic encephalopathy    Obstructive sleep apnea    Morbid obesity with BMI of 60.0-69.9, adult (HCC)    Goals of care,  counseling/discussion    Palliative care encounter    Sepsis (HCC) 04/27/2019   H/O insulin dependent diabetes mellitus 04/27/2019   History of CVA with residual deficit 04/27/2019   Seizure disorder (HCC) 04/27/2019   Decubitus ulcer of sacral region, stage 4 (HCC) 04/27/2019    Olegario Messier, MS, OTR/L 01/11/2021, 2:07 PM  [place] [facility] MAIN [facility] SERVICES [address] [place], [place], [address] Phone: [phone]   Fax:  [phone]  Name: Paul Hall MRN: [id number] Date of Birth: [birth year]

## 2021-01-12 NOTE — Therapy (Signed)
[place] [facility] MAIN [facility] SERVICES [address] [place], [place], [address] Phone: [phone]   Fax:  [phone]  Speech Language Pathology Treatment  Patient Details  Name: Paul Hall MRN: [id number] Date of Birth: 1996-02-27 Referring Provider (SLP): Lenise Herald   Encounter Date: 01/11/2021   End of Session - 01/12/21 1021     Visit Number 7    Number of Visits 25    Date for SLP Re-Evaluation 03/09/21    Authorization Type [employer]    Authorization Time Period 12/15/2020 thru 16/09/2020    Authorization - Visit Number 7    Authorization - Number of Visits 12    Progress Note Due on Visit 10    SLP Start Time 1100    SLP Stop Time  1200    SLP Time Calculation (min) 60 min    Activity Tolerance Patient tolerated treatment well             Past Medical History:  Diagnosis Date   Diabetes mellitus (HCC)    Hypertension    Stroke [facility])     Past Surgical History:  Procedure Laterality Date   IVC FILTER PLACEMENT ([facility] HX)     LEG SURGERY     PEG TUBE PLACEMENT     TRACHEOSTOMY      There were no vitals filed for this visit.   Subjective Assessment - 01/12/21 1014     Subjective "I didn't watch the game"    Patient is accompained by: Family member    Currently in Pain? No/denies                   ADULT SLP TREATMENT - 01/12/21 0001       Treatment Provided   Treatment provided Cognitive-Linquistic      Cognitive-Linquistic Treatment   Treatment focused on Cognition;Dysarthria;Patient/family/caregiver education    Skilled Treatment COGNITION: [employer] app on iPad used to target multiple skill areas:   GENERAL KNOWLEDGE - 2 - 80% improving to 100% with repetition, ADDITION (level 1) - maximal assistance provided but continued difficulty noted with use of strategies - will no longer target d/t difficulty at baseline with math;  COMMUNICATING  WANTS/NEEDS/OPINIONS/FEELINGS: with moderate assistance pt able to identify feeling of grief over lost ability to ad basic numbers, grief over being dependent on his parents; extensive education and support provided with encouragement for pt to develop habit of speaking "his truth" in response to questions              SLP Education - 01/12/21 1020     Education Details importance of patient answering all questions accurately and truthfully    Person(s) Educated Patient    Methods Explanation    Comprehension Verbalized understanding;Need further instruction              SLP Short Term Goals - 01/01/21 0744       SLP SHORT TERM GOAL #1   Title With moderate cues, pt will increase vocal intensity to achieve > 95% speech intelligibility at the sentence level in a mildly noisey environment.    Time 10    Period --   sessions   Status On-going      SLP SHORT TERM GOAL #2   Title Pt will comprehend list of current medications to be able to answer yes/no questions with 755 accuracy given minimal assistance.    Baseline goal revised to address functional deficits  Time 10    Period --   sessions   Status Revised      SLP SHORT TERM GOAL #3   Title After listening to functional information, pt will recall 2 pieces of functional information with 50% accuracy given minimal assistance.    Baseline goal revised to address functional deficits    Time 10    Period --   sessions   Status Revised      SLP SHORT TERM GOAL #4   Title With moderate cues, pt will perform basic mental manipulation activities with 75% accuracy.    Time 10    Period --   sessions   Status On-going              SLP Long Term Goals - 01/01/21 0747       SLP LONG TERM GOAL #1   Title Pt will utilize speech intelligibility strategies to achieve > 95% intelligibility at the sentence level in a mildly noisy environment.    Time 12    Period Weeks    Status On-going    Target Date 03/09/21       SLP LONG TERM GOAL #2   Title Pt will comprehend his list of medications in order to recall 6 medications with minimal assistance.    Baseline revised on 01/01/2021 to address functional deficits    Time 12    Period Weeks    Status Revised    Target Date 03/09/21      SLP LONG TERM GOAL #3   Title Pt will verbally generate solutions to problems encountered in everyday living tasks with moderate assistance.    Baseline new goal added to address functional deficits    Time 12    Period Weeks    Status New    Target Date 03/09/21              Plan - 01/12/21 1021     Clinical Impression Statement Pt presents with moderate cognitive communication deficits that are further complicated by deficits in vision and bilateral upper extremity weakness. Pt continues to struggle with performing basic mental manipulation tasks. In addiiton, he struggles to provide honest answers that reflect his wants/needs for fear of adding extra burden to his parents. As such, skilled ST intervention continues to be required to target his moderate cognitive communication impairments in an effort to increase his functional independence and reduce caregiver burden.    Speech Therapy Frequency 2x / week    Duration 12 weeks    Treatment/Interventions Language facilitation;Cueing hierarchy;Internal/external aids;Functional tasks;SLP instruction and feedback;Patient/family education;Cognitive reorganization;Compensatory strategies    Potential to Achieve Goals Fair    Potential Considerations Severity of impairments;Co-morbidities    SLP Home Exercise Plan provided, see pt instructions section    Consulted and Agree with Plan of Care Patient;Family member/caregiver    Family Member Consulted pt's dad             Patient will benefit from skilled therapeutic intervention in order to improve the following deficits and impairments:   Cognitive communication deficit  History of CVA with residual  deficit    Problem List Patient Active Problem List   Diagnosis Date Noted   Sepsis due to vancomycin resistant Enterococcus species (HCC) 06/06/2019   SIRS (systemic inflammatory response syndrome) (HCC) 06/05/2019   Acute lower UTI 06/05/2019   VRE (vancomycin-resistant Enterococci) infection 06/05/2019   Anemia 06/05/2019   Skin ulcer of sacrum with necrosis of muscle (HCC)  Urinary retention    Type 2 diabetes mellitus without complication, with long-term current use of insulin (HCC)    Tachycardia    Lower extremity edema    Acute metabolic encephalopathy    Obstructive sleep apnea    Morbid obesity with BMI of 60.0-69.9, adult (HCC)    Goals of care, counseling/discussion    Palliative care encounter    Sepsis (HCC) 04/27/2019   H/O insulin dependent diabetes mellitus 04/27/2019   History of CVA with residual deficit 04/27/2019   Seizure disorder (HCC) 04/27/2019   Decubitus ulcer of sacral region, stage 4 (HCC) 04/27/2019   Arnett Galindez B. Dreama Saa M.S., CCC-SLP, [facility] Speech-Language Pathologist Rehabilitation Services Office [phone]  Reuel Derby 01/12/2021, 10:24 AM  [place] [facility] MAIN [facility] SERVICES [address] [place], [place], [address] Phone: [phone]   Fax:  [phone]   Name: Paul Hall MRN: [id number] Date of Birth: November 21, 1995

## 2021-01-12 NOTE — Patient Instructions (Signed)
Speak patient's "truth" in response to all questions Continue talk path, answering general knowledge questions

## 2021-01-13 ENCOUNTER — Ambulatory Visit: Payer: BC Managed Care – PPO | Admitting: Occupational Therapy

## 2021-01-13 ENCOUNTER — Ambulatory Visit: Payer: BC Managed Care – PPO | Admitting: Speech Pathology

## 2021-01-13 ENCOUNTER — Other Ambulatory Visit: Payer: Self-pay

## 2021-01-13 DIAGNOSIS — R278 Other lack of coordination: Secondary | ICD-10-CM

## 2021-01-13 DIAGNOSIS — I6930 Unspecified sequelae of cerebral infarction: Secondary | ICD-10-CM | POA: Diagnosis not present

## 2021-01-13 DIAGNOSIS — R262 Difficulty in walking, not elsewhere classified: Secondary | ICD-10-CM | POA: Diagnosis not present

## 2021-01-13 DIAGNOSIS — M6281 Muscle weakness (generalized): Secondary | ICD-10-CM

## 2021-01-13 DIAGNOSIS — R41841 Cognitive communication deficit: Secondary | ICD-10-CM | POA: Diagnosis not present

## 2021-01-13 DIAGNOSIS — R2681 Unsteadiness on feet: Secondary | ICD-10-CM | POA: Diagnosis not present

## 2021-01-13 DIAGNOSIS — R471 Dysarthria and anarthria: Secondary | ICD-10-CM

## 2021-01-13 DIAGNOSIS — R269 Unspecified abnormalities of gait and mobility: Secondary | ICD-10-CM | POA: Diagnosis not present

## 2021-01-13 NOTE — Therapy (Signed)
[place] [facility] MAIN [facility] SERVICES [address] [place], [place], [address] Phone: [phone]   Fax:  [phone]  Occupational Therapy Treatment  Patient Details  Name: Paul Hall MRN: [id number] Date of Birth: May 04, 1995 Referring Provider (OT): Lenise Herald   Encounter Date: 01/13/2021   OT End of Session - 01/13/21 1518     Visit Number 8    Number of Visits 24    Date for OT Re-Evaluation 03/24/21    Authorization Type Progress report period starting 12/15/2020    OT Start Time 1515    OT Stop Time 1600    OT Time Calculation (min) 45 min    Equipment Utilized During Treatment tilt in space power wc    Activity Tolerance Patient tolerated treatment well    Behavior During Therapy WFL for tasks assessed/performed             Past Medical History:  Diagnosis Date   Diabetes mellitus (HCC)    Hypertension    Stroke [facility]     Past Surgical History:  Procedure Laterality Date   IVC FILTER PLACEMENT ([facility] HX)     LEG SURGERY     PEG TUBE PLACEMENT     TRACHEOSTOMY      There were no vitals filed for this visit.   Subjective Assessment - 01/13/21 1511     Subjective  Pt reports enjoying the fall weather.    Patient is accompanied by: Family member    Pertinent History Pt. is a 25 y.o. male who was hospitalized, and diagnosed with COVID-19, sustained a CVA with resultant quadreplegia in 01/2019. Pt. was hospitalized with VRE  UTI. Pt. PMHx includes: urinary retention, Seizure Disorder, Obstructive Sleep Disorder, DM Type II, and Morbid obesity. Pt. resides at home with his parents.    Limitations BUE, and LE limitations with ROM, strength, motor control, and [facility] skills.    Patient Stated Goals To improve his arms, and hands.    Currently in Pain? No/denies    Pain Onset More than a month ago    Pain Onset More than a month ago              Therapeutic Exercise Patient tolerated active range of motion  reps for bilateral shoulder flexion abduction. Passive range of motion for bilateral elbow flexion and extension with slow prolonged gentle stretching for elbow extension, wrist extension, and digit extension. Patient tolerated moist heat modality to his bilateral hands prior to range of motion.   E-Stim Pt. Tolerated Neuromuscular E-Stim to the R wrist extensors with constant attendance for 8 min. Cycle time: 10/10, cycle: 50%, Ramp 5 sec. Intensity: 25.  Therapist providing additional guidance to fingers while in active phase of stim.         OT Education - 01/13/21 1517     Education Details Pt./caregiver education was provided about ROM.    Person(s) Educated Patient    Methods Explanation    Comprehension Verbalized understanding;Verbal cues required;Need further instruction                 OT Long Term Goals - 12/31/20 0843       OT LONG TERM GOAL #1   Title Pt. will improve FOTO score by 2 points for clinically significant improvements during ADLs, and IADLs,    Baseline Eval: FOTO score: 41    Time 12    Period Weeks    Status On-going    Target Date 03/24/21  OT LONG TERM GOAL #2   Title Pt. will improve right shoulder flexion by 10 degrees to assist caregivers with UE dressing    Baseline Eval: R: 62(94), L: 114(120    Time 12    Period Weeks    Status On-going    Target Date 03/25/21      OT LONG TERM GOAL #3   Title Pt. will improve right Abduction by 10 degrees to assist caregivers with underarm hygiene care.    Baseline Eval: R: 76(93)    Time 12    Period Weeks    Status On-going    Target Date 03/24/21      OT LONG TERM GOAL #4   Title Pt. will demonstrate visual compensatory strategies for navigating through his environment.    Baseline Eval: Limited    Time 12    Period Weeks    Status On-going    Target Date 03/24/21      OT LONG TERM GOAL #5   Title Pt. will demonstrate visual compensatory strategies for tabletop tasks within his  near space.    Baseline Eval: Pt. has difficulty    Time 12    Period Weeks    Status On-going    Target Date 03/24/21      OT LONG TERM GOAL #6   Title Pt. will perform hand to mouth patterns with min A in preparation for self-feeding.    Baseline Eval: Pt. has difficulty    Time 12    Period Weeks    Status On-going    Target Date 03/24/21      OT LONG TERM GOAL #7   Title Pt. will perform one step self-grooming tasks with minA.    Baseline Eval: pt. has difficulty    Time 12    Period Weeks    Status On-going    Target Date 03/24/21                   Plan - 01/13/21 1519     Clinical Impression Statement Pt. presents with flexor tone, and tightness in the bilateral elbows, forearms, wrist, and digits which limit functional reaching, and active engagement of the BUEs during ADLs, and IADLs. Patient tolerated range of motion, and stretching to his bilateral elbows, wrists, and digits. Pt. continues to present with active wrist, and MP extension, however presents with MP hyperextension with PIP/DIP flexion when attempting active digit MP extension. Pt. tolerated Estim to the R wrist, and digit extensors. Patient continues to work on normalizing tone in the bilateral upper extremities in order to improve range of motion,  and facilitate active range of motion in order to increase engagement in ADLs and IADL tasks.    OT Occupational Profile and History Detailed Assessment- Review of Records and additional review of physical, cognitive, psychosocial history related to current functional performance    Occupational performance deficits (Please refer to evaluation for details): ADL's;IADL's    Body Structure / Function / Physical Skills ADL;FMC;ROM;Dexterity;IADL;Endurance;Obesity;Strength;UE functional use    Rehab Potential Good    Clinical Decision Making Multiple treatment options, significant modification of task necessary    Comorbidities Affecting Occupational  Performance: Presence of comorbidities impacting occupational performance    Modification or Assistance to Complete Evaluation  Max significant modification of tasks or assist is necessary to complete    OT Frequency 2x / week    OT Duration 12 weeks    OT Treatment/Interventions Self-care/ADL training;Therapeutic exercise;Neuromuscular education;Visual/perceptual remediation/compensation;Patient/family education;Therapeutic activities;DME and/or  AE instruction;Passive range of motion;Moist Heat;Electrical Stimulation    Consulted and Agree with Plan of Care Patient             Patient will benefit from skilled therapeutic intervention in order to improve the following deficits and impairments:   Body Structure / Function / Physical Skills: ADL, FMC, ROM, Dexterity, IADL, Endurance, Obesity, Strength, UE functional use       Visit Diagnosis: Muscle weakness (generalized)  Other lack of coordination  History of CVA with residual deficit    Problem List Patient Active Problem List   Diagnosis Date Noted   Sepsis due to vancomycin resistant Enterococcus species (HCC) 06/06/2019   SIRS (systemic inflammatory response syndrome) (HCC) 06/05/2019   Acute lower UTI 06/05/2019   VRE (vancomycin-resistant Enterococci) infection 06/05/2019   Anemia 06/05/2019   Skin ulcer of sacrum with necrosis of muscle (HCC)    Urinary retention    Type 2 diabetes mellitus without complication, with long-term current use of insulin (HCC)    Tachycardia    Lower extremity edema    Acute metabolic encephalopathy    Obstructive sleep apnea    Morbid obesity with BMI of 60.0-69.9, adult (HCC)    Goals of care, counseling/discussion    Palliative care encounter    Sepsis (HCC) 04/27/2019   H/O insulin dependent diabetes mellitus 04/27/2019   History of CVA with residual deficit 04/27/2019   Seizure disorder (HCC) 04/27/2019   Decubitus ulcer of sacral region, stage 4 (HCC) 04/27/2019   Kathie Dike, M.S. OTR/L  01/13/21, 5:04 PM  ascom [phone]  [place] [facility] MAIN [facility] SERVICES [address] [place], [place], [address] Phone: [phone]   Fax:  [phone]  Name: Paul Hall MRN: [id number] Date of Birth: 07/22/1995

## 2021-01-16 NOTE — Therapy (Signed)
[employer] [facility] MAIN [facility] SERVICES [address] [place], [place], [address] Phone: [phone]   Fax:  [phone]  Speech Language Pathology Treatment  Patient Details  Name: Paul Hall MRN: [id number] Date of Birth: 1995/05/24 Referring Provider (SLP): Lenise Herald   Encounter Date: 01/13/2021   End of Session - 01/16/21 1024     Visit Number 8    Number of Visits 25    Date for SLP Re-Evaluation 03/09/21    Authorization Type [employer]    Authorization Time Period 12/15/2020 thru 16/09/2020    Authorization - Visit Number 8    Authorization - Number of Visits 12    Progress Note Due on Visit 10    SLP Start Time 1400    SLP Stop Time  1500    SLP Time Calculation (min) 60 min    Activity Tolerance Patient tolerated treatment well             Past Medical History:  Diagnosis Date   Diabetes mellitus (HCC)    Hypertension    Stroke [facility])     Past Surgical History:  Procedure Laterality Date   IVC FILTER PLACEMENT ([facility] HX)     LEG SURGERY     PEG TUBE PLACEMENT     TRACHEOSTOMY      There were no vitals filed for this visit.   Subjective Assessment - 01/16/21 0928     Subjective "I am doing well, how are you Ms Paul Hall"    Patient is accompained by: Family member    Currently in Pain? No/denies                   ADULT SLP TREATMENT - 01/16/21 0001       Treatment Provided   Treatment provided Cognitive-Linquistic      Cognitive-Linquistic Treatment   Treatment focused on Cognition;Dysarthria;Patient/family/caregiver education    Skilled Treatment COGNITION: memory - [facility] memory - moderate assistance required to make sentence out of 4 scrambled words - pt struggled with more abstract words required increased cues when words created a question;  repetition memory COMMUNICATING WANTS/NEEDS/OPINIONS/FEELINGS: moderate prompts to express his wants/needs to SLP               SLP Education - 01/16/21 1024     Education Details memory strategies    Person(s) Educated Patient;Parent(s)    Methods Explanation;Demonstration;Verbal cues    Comprehension Verbalized understanding;Need further instruction              SLP Short Term Goals - 01/01/21 0744       SLP SHORT TERM GOAL #1   Title With moderate cues, pt will increase vocal intensity to achieve > 95% speech intelligibility at the sentence level in a mildly noisey environment.    Time 10    Period --   sessions   Status On-going      SLP SHORT TERM GOAL #2   Title Pt will comprehend list of current medications to be able to answer yes/no questions with 755 accuracy given minimal assistance.    Baseline goal revised to address functional deficits    Time 10    Period --   sessions   Status Revised      SLP SHORT TERM GOAL #3   Title After listening to functional information, pt will recall 2 pieces of functional information with 50% accuracy given minimal assistance.    Baseline goal revised to  address functional deficits    Time 10    Period --   sessions   Status Revised      SLP SHORT TERM GOAL #4   Title With moderate cues, pt will perform basic mental manipulation activities with 75% accuracy.    Time 10    Period --   sessions   Status On-going              SLP Long Term Goals - 01/01/21 0747       SLP LONG TERM GOAL #1   Title Pt will utilize speech intelligibility strategies to achieve > 95% intelligibility at the sentence level in a mildly noisy environment.    Time 12    Period Weeks    Status On-going    Target Date 03/09/21      SLP LONG TERM GOAL #2   Title Pt will comprehend his list of medications in order to recall 6 medications with minimal assistance.    Baseline revised on 01/01/2021 to address functional deficits    Time 12    Period Weeks    Status Revised    Target Date 03/09/21      SLP LONG TERM GOAL #3   Title Pt will verbally generate  solutions to problems encountered in everyday living tasks with moderate assistance.    Baseline new goal added to address functional deficits    Time 12    Period Weeks    Status New    Target Date 03/09/21              Plan - 01/16/21 1025     Clinical Impression Statement Pt continues to present with moderate cognitive communication deficits that are further complicated by deficits in vision and bilateral upper extremity weakness. Pt continues to struggle with performing basic mental manipulation tasks. In addiiton, he struggles to provide honest answers that reflect his wants/needs for fear of adding extra burden to his parents. As such, skilled ST intervention continues to be required to target his moderate cognitive communication impairments in an effort to increase his functional independence and reduce caregiver burden.    Speech Therapy Frequency 2x / week    Duration 12 weeks    Treatment/Interventions Language facilitation;Cueing hierarchy;Internal/external aids;Functional tasks;SLP instruction and feedback;Patient/family education;Cognitive reorganization;Compensatory strategies    Potential to Achieve Goals Fair    Potential Considerations Severity of impairments;Co-morbidities    SLP Home Exercise Plan provided, see pt instructions section    Consulted and Agree with Plan of Care Patient;Family member/caregiver    Family Member Consulted pt's dad             Patient will benefit from skilled therapeutic intervention in order to improve the following deficits and impairments:   Cognitive communication deficit  Dysarthria and anarthria  History of CVA with residual deficit    Problem List Patient Active Problem List   Diagnosis Date Noted   Sepsis due to vancomycin resistant Enterococcus species (HCC) 06/06/2019   SIRS (systemic inflammatory response syndrome) (HCC) 06/05/2019   Acute lower UTI 06/05/2019   VRE (vancomycin-resistant Enterococci) infection  06/05/2019   Anemia 06/05/2019   Skin ulcer of sacrum with necrosis of muscle (HCC)    Urinary retention    Type 2 diabetes mellitus without complication, with long-term current use of insulin (HCC)    Tachycardia    Lower extremity edema    Acute metabolic encephalopathy    Obstructive sleep apnea    Morbid obesity with BMI  of 60.0-69.9, adult [facility])    Goals of care, counseling/discussion    Palliative care encounter    Sepsis (HCC) 04/27/2019   H/O insulin dependent diabetes mellitus 04/27/2019   History of CVA with residual deficit 04/27/2019   Seizure disorder (HCC) 04/27/2019   Decubitus ulcer of sacral region, stage 4 (HCC) 04/27/2019   Paul Hall B. Dreama Saa M.S., CCC-SLP, [facility] Speech-Language Pathologist Rehabilitation Services Office [phone]  Reuel Derby 01/16/2021, 10:26 AM  [place] [facility] MAIN [facility] SERVICES [address] [place], [place], [address] Phone: [phone]   Fax:  [phone]   Name: Paul Hall MRN: [id number] Date of Birth: [birth year]

## 2021-01-18 ENCOUNTER — Other Ambulatory Visit: Payer: Self-pay

## 2021-01-18 ENCOUNTER — Ambulatory Visit: Payer: BC Managed Care – PPO | Admitting: Occupational Therapy

## 2021-01-18 ENCOUNTER — Ambulatory Visit: Payer: BC Managed Care – PPO | Admitting: Physical Therapy

## 2021-01-18 ENCOUNTER — Ambulatory Visit: Payer: BC Managed Care – PPO | Admitting: Speech Pathology

## 2021-01-18 DIAGNOSIS — M6281 Muscle weakness (generalized): Secondary | ICD-10-CM | POA: Diagnosis not present

## 2021-01-18 DIAGNOSIS — R278 Other lack of coordination: Secondary | ICD-10-CM | POA: Diagnosis not present

## 2021-01-18 DIAGNOSIS — R2681 Unsteadiness on feet: Secondary | ICD-10-CM | POA: Diagnosis not present

## 2021-01-18 DIAGNOSIS — R262 Difficulty in walking, not elsewhere classified: Secondary | ICD-10-CM | POA: Diagnosis not present

## 2021-01-18 DIAGNOSIS — I6930 Unspecified sequelae of cerebral infarction: Secondary | ICD-10-CM | POA: Diagnosis not present

## 2021-01-18 DIAGNOSIS — R269 Unspecified abnormalities of gait and mobility: Secondary | ICD-10-CM | POA: Diagnosis not present

## 2021-01-18 DIAGNOSIS — R41841 Cognitive communication deficit: Secondary | ICD-10-CM | POA: Diagnosis not present

## 2021-01-18 DIAGNOSIS — R471 Dysarthria and anarthria: Secondary | ICD-10-CM | POA: Diagnosis not present

## 2021-01-18 NOTE — Therapy (Addendum)
[place] [facility] MAIN [facility] SERVICES [address] [place], [place], [address] Phone: [phone]   Fax:  [phone]  Occupational Therapy Treatment  Patient Details  Name: Paul Hall MRN: [id number] Date of Birth: [birth year] Referring Provider (OT): Lenise Herald   Encounter Date: 01/18/2021   OT End of Session - 01/18/21 1722     Visit Number 9    Number of Visits 24    Date for OT Re-Evaluation 03/24/21    Authorization Type Progress report period starting 12/15/2020    OT Start Time 1300    OT Stop Time 1350    OT Time Calculation (min) 50 min    Activity Tolerance Patient tolerated treatment well    Behavior During Therapy [facility] for tasks assessed/performed             Past Medical History:  Diagnosis Date   Diabetes mellitus (HCC)    Hypertension    Stroke [facility])     Past Surgical History:  Procedure Laterality Date   IVC FILTER PLACEMENT ([facility] HX)     LEG SURGERY     PEG TUBE PLACEMENT     TRACHEOSTOMY      There were no vitals filed for this visit.   Subjective Assessment - 01/18/21 1721     Subjective  Pt reports enjoying the fall weather.    Patient is accompanied by: Family member    Pertinent History Pt. is a 25 y.o. male who was hospitalized, and diagnosed with COVID-19, sustained a CVA with resultant quadreplegia in 01/2019. Pt. was hospitalized with VRE  UTI. Pt. PMHx includes: urinary retention, Seizure Disorder, Obstructive Sleep Disorder, DM Type II, and Morbid obesity. Pt. resides at home with his parents.    Limitations BUE, and LE limitations with ROM, strength, motor control, and [facility] skills.    Currently in Pain? No/denies            OT TREATMENT     Estim attended:   Pt. tolerated neuromuscular estim  for 8 min., intensity 28, cycle time 10/10,  duty cycle 50%. Applied to the right wrist, and digit extensors, followed by  8 min. Applied to the left wrist, and digit extensors with constant  monitoring 100% of the time, with the intensity set to 38.    Therapeutic Exercise:   Patient tolerated active range of motion reps for bilateral shoulder flexion abduction. Passive range of motion for bilateral elbow flexion and extension with slow prolonged gentle stretching for elbow extension, wrist extension, and digit extension. Patient tolerated moist heat modality to his bilateral hands prior to range of motion.   Pt. presents with flexor tone, and tightness in the bilateral elbows, forearms, wrist, and digits which limit functional reaching, and active engagement of the BUEs during ADLs, and IADLs. Patient tolerated range of motion, and stretching to his bilateral elbows, wrists, and digits. Pt. continues to present with active wrist, and MP extension, however presents with MP hyperextension with PIP/DIP flexion when attempting active digit MP extension. Pt. tolerated Estim to the right, and left  left wrist, and digit extensors. Patient continues to work on normalizing tone in the bilateral upper extremities in order to improve range of motion,  and facilitate active range of motion in order to increase engagement in ADLs and IADL tasks.                          OT Education - 01/18/21 1721  Education Details Pt./caregiver education was provided about ROM.    Person(s) Educated Patient    Methods Explanation    Comprehension Verbalized understanding;Verbal cues required;Need further instruction                 OT Long Term Goals - 12/31/20 0843       OT LONG TERM GOAL #1   Title Pt. will improve FOTO score by 2 points for clinically significant improvements during ADLs, and IADLs,    Baseline Eval: FOTO score: 41    Time 12    Period Weeks    Status On-going    Target Date 03/24/21      OT LONG TERM GOAL #2   Title Pt. will improve right shoulder flexion by 10 degrees to assist caregivers with UE dressing    Baseline Eval: R: 62(94), L: 114(120     Time 12    Period Weeks    Status On-going    Target Date 03/25/21      OT LONG TERM GOAL #3   Title Pt. will improve right Abduction by 10 degrees to assist caregivers with underarm hygiene care.    Baseline Eval: R: 76(93)    Time 12    Period Weeks    Status On-going    Target Date 03/24/21      OT LONG TERM GOAL #4   Title Pt. will demonstrate visual compensatory strategies for navigating through his environment.    Baseline Eval: Limited    Time 12    Period Weeks    Status On-going    Target Date 03/24/21      OT LONG TERM GOAL #5   Title Pt. will demonstrate visual compensatory strategies for tabletop tasks within his near space.    Baseline Eval: Pt. has difficulty    Time 12    Period Weeks    Status On-going    Target Date 03/24/21      OT LONG TERM GOAL #6   Title Pt. will perform hand to mouth patterns with min A in preparation for self-feeding.    Baseline Eval: Pt. has difficulty    Time 12    Period Weeks    Status On-going    Target Date 03/24/21      OT LONG TERM GOAL #7   Title Pt. will perform one step self-grooming tasks with minA.    Baseline Eval: pt. has difficulty    Time 12    Period Weeks    Status On-going    Target Date 03/24/21                   Plan - 01/18/21 1722     Clinical Impression Statement Pt. presents with flexor tone, and tightness in the bilateral elbows, forearms, wrist, and digits which limit functional reaching, and active engagement of the BUEs during ADLs, and IADLs. Patient tolerated range of motion, and stretching to his bilateral elbows, wrists, and digits. Pt. continues to present with active wrist, and MP extension, however presents with MP hyperextension with PIP/DIP flexion when attempting active digit MP extension. Pt. tolerated Estim to the right, and left  left wrist, and digit extensors. Patient continues to work on normalizing tone in the bilateral upper extremities in order to improve range of  motion,  and facilitate active range of motion in order to increase engagement in ADLs and IADL tasks.   OT Occupational Profile and History Detailed Assessment- Review of Records and additional  review of physical, cognitive, psychosocial history related to current functional performance    Occupational performance deficits (Please refer to evaluation for details): ADL's;IADL's    Body Structure / Function / Physical Skills ADL;FMC;ROM;Dexterity;IADL;Endurance;Obesity;Strength;UE functional use    Rehab Potential Good    Clinical Decision Making Multiple treatment options, significant modification of task necessary    Comorbidities Affecting Occupational Performance: Presence of comorbidities impacting occupational performance    Modification or Assistance to Complete Evaluation  Max significant modification of tasks or assist is necessary to complete    OT Frequency 2x / week    OT Duration 12 weeks    OT Treatment/Interventions Self-care/ADL training;Therapeutic exercise;Neuromuscular education;Visual/perceptual remediation/compensation;Patient/family education;Therapeutic activities;DME and/or AE instruction;Passive range of motion;Moist Heat;Electrical Stimulation    Consulted and Agree with Plan of Care Patient             Patient will benefit from skilled therapeutic intervention in order to improve the following deficits and impairments:   Body Structure / Function / Physical Skills: ADL, FMC, ROM, Dexterity, IADL, Endurance, Obesity, Strength, UE functional use       Visit Diagnosis: Muscle weakness (generalized)  Other lack of coordination    Problem List Patient Active Problem List   Diagnosis Date Noted   Sepsis due to vancomycin resistant Enterococcus species (HCC) 06/06/2019   SIRS (systemic inflammatory response syndrome) (HCC) 06/05/2019   Acute lower UTI 06/05/2019   VRE (vancomycin-resistant Enterococci) infection 06/05/2019   Anemia 06/05/2019   Skin ulcer of  sacrum with necrosis of muscle (HCC)    Urinary retention    Type 2 diabetes mellitus without complication, with long-term current use of insulin (HCC)    Tachycardia    Lower extremity edema    Acute metabolic encephalopathy    Obstructive sleep apnea    Morbid obesity with BMI of 60.0-69.9, adult (HCC)    Goals of care, counseling/discussion    Palliative care encounter    Sepsis (HCC) 04/27/2019   H/O insulin dependent diabetes mellitus 04/27/2019   History of CVA with residual deficit 04/27/2019   Seizure disorder (HCC) 04/27/2019   Decubitus ulcer of sacral region, stage 4 (HCC) 04/27/2019    Olegario Messier, MS, OTR/L 01/18/2021, 5:24 PM  [place] [facility] MAIN [facility] SERVICES [address] [place], [place], [address] Phone: [phone]   Fax:  [phone]  Name: Paul Hall MRN: [id number] Date of Birth: [birth year]

## 2021-01-18 NOTE — Therapy (Signed)
[facility] MAIN [facility] SERVICES [address] [place], [place], [address] Phone: [phone]   Fax:  [phone]  Physical Therapy Treatment  Patient Details  Name: Paul Hall MRN: [id number] Date of Birth: [birth year] Referring Provider (PT): Lenise Herald, PA-C   Encounter Date: 01/18/2021   PT End of Session - 01/18/21 1242     Visit Number 2    Number of Visits 25    Date for PT Re-Evaluation 03/29/21    Authorization Type [employer] and [employer]- Need auth past 12th visit    Authorization - Visit Number 1    Authorization - Number of Visits 12    Progress Note Due on Visit 10    PT Start Time 1350    PT Stop Time 1430    PT Time Calculation (min) 40 min    Equipment Utilized During Treatment Gait belt;Other (comment)   hoyer lift   Activity Tolerance Patient tolerated treatment well    Behavior During Therapy [facility] for tasks assessed/performed;Anxious             Past Medical History:  Diagnosis Date   Diabetes mellitus (HCC)    Hypertension    Stroke [facility])     Past Surgical History:  Procedure Laterality Date   IVC FILTER PLACEMENT ([facility] HX)     LEG SURGERY     PEG TUBE PLACEMENT     TRACHEOSTOMY      There were no vitals filed for this visit.   Subjective Assessment - 01/18/21 1239     Subjective Pt reports no sinificant changes sicne previous session. Does request to have 2 therapist/ staff present when attempting to transfer or stand to improve his compfrt level as he gets very anxious and scared when teransfering.    Patient is accompained by: Family member   Mom- Paul Hall; Dad= Paul Hall   Pertinent History Patient is a 25 y.o. male with insulin dependent diabetes and morbid obesity (BMI 65) who was admitted with ARDS in the setting of Covid-19 On10/18/2020 History taken per admission note dated 01/20/2019. On 11/13, the patient was noticed to have RUE twitching. A noncontrast head CT was done and  discovered an ischemic stroke and a subdural hemorrhage with slight midline shift. ICP management was initiated by keeping Na > 145 (on 280ml/hr normal saline) and head of the bed > 90 degrees. Neurosurgery was consulted and does not recommend surgical decompression at this time. His heparin gtt that was being used to treat his bilateral DVTs was stopped and vascular surgery was consulted to place and IVC filter on 11/14. On 11/15 he was transferred back to the MICU after his CVAs did not worsen or evolve. He was not able to be weaned from the vent and underwent trach placement on 11/18 and G-tube placement on 11/19.    On 11/26 patient was noted to have "inward eye deviation" and "downward eye deviation with occasional twitch in the left eye" per nursing, concerning for seizure-like activity. He was restarted on cvEEG today and continued to Keppra 1 g BID. CT head wo contrast was obtained which showed new left frontal IPH with 1 cm midline shift and subfalcine herniation, and prior large left hemispheric infarct. . The patient's neurological exam has remained stable with brainstem reflexes intact, but functionally quadriplegic. with inward/downward eye deviation on 11/26 while on maintenance Keppra. There is no pathological intracranial focality in epilepsy that would make the patient's gaze present in such a manner. There  was mild dysconjugate gaze with the right eye out, however, this was not fixed, there was no rhythmic, stereotyped movements to suggest seizures. cvEEG monitoring 11/27-11/28 revealed no seizures only focal slowing consistent with his known vascular insult. There was a push button event for extremity shaking that had no ccvEEG correlate.  - Keppra 2 g BID.     Subacute ischemic stroke (left, ACA/MCA/PCA distribution)  Likely secondary to COVID hypercoagulable state. Carotid Dopplers negative. Stroke likely subacute given 3 serial CTs demonstrated no evolution of stroke size. Will get statin once  CK/rhabdomyolysis and abnl LFTs resolve. 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    Limitations Lifting;Standing;Walking;Writing;Reading;Sitting;House hold activities    How long can you sit comfortably? Patient reports uable to sit up long < 4 hours due to buttock discomfort    How long can you stand comfortably? Patient is currently unable to stand    How long can you walk comfortably? Patient is currently unable to walk    Diagnostic tests FINDINGS:  Brain: Left cerebral convexity cortical hypodensity with associated  cortical calcification extending from the frontal to the parietal  lobe. Extensive area of volume loss and low-density compatible with  chronic infarction. This is [facility] on [name].  Probable chronic watershed infarct. Additional small areas of  hypodensity in the right frontal white matter consistent with  chronic ischemia.     Negative for acute infarct. No acute hemorrhage or mass. Ventricle  size normal without midline shift.     Vascular: Normal arterial flow voids.     Skull: Negative     Sinuses/Orbits: Mucosal edema paranasal sinuses with air-fluid level  left sphenoid sinus. Negative orbit     Other: None     IMPRESSION:  Extensive watershed type infarct over the left cerebral convexity  with cortical calcification. This appears chronic. No associated  hemorrhage, hydrocephalus, or midline shift.     These results were called by telephone at the time of interpretation  on 05/09/2019 at 3:12 pm to provider Alford Highland , who verbally  acknowledged these results.        Electronically Signed    By: Marlan Palau M.D.    On: 05/09/2019 15:13       Result History    CT HEAD WO CONTRAST (Order #[id number]) on 05/09/2019 - Order Result History Report    Patient Stated Goals I want to be able to possibly walk again    Pain Onset More than a month ago    Pain Onset More than a month ago             Treatment provided this  session  Therex:  Following exercise performed in seated position: Marching x 10 ea LE -Cues for proper muscle activation  Add squeeze 1 x 10 x 5 sec -Cues to proper muscle activation  There Act:    Transfer from Power wheelchair to treatment table utilizing hoyer lift and 2 x assist with 2 staff and one family member. Pt very nervous and hesitant to complete transfer due to fear of falling or failure.   Once transferred to bariatric treatment table patient was seated with slight knee flexion as well as with trunk slightly elevated as seen and attached images in this picture.  Patient is not pictured but the exact set up of table following taking patient off with pictured.  Table did have to be altered in order to improve patient comfort.  Due  to issues with battery on [facility] lift we are not able to perform his basic activities as was part of initial physical therapy plan.  We will continue to work on similar activities next physical therapy session.  Transferred from bariatric treatment table to power wheelchair utilizing [facility] lift and 2 person assist with 1 therapist needed moving [place] lift and other therapist attending to patient needs as patient unable to hold onto [place] lift with his hands increased support was necessary.  When transitioning back to chair physical therapist and patient's father/caregiver assisted in making sure patient was seated properly   Patient required high levels of assistance with his feet with all transfers as they tried to hit part of for you with that he had complains of it hurting at certain points.  Patient has significant stiffness and tightness in his lower extremities and was noted with this movement and transfer.    Pt educated throughout session about proper posture and technique with exercises. Improved exercise technique, movement at target joints, use of target muscles after min to mod verbal, visual, tactile cues.  Patient has high levels of  anxiety when performing activities outside of his wheelchair.  She is very motivated to complete exercises as well as to improve.  Note: Portions of this document were prepared using Dragon voice recognition software and although reviewed may contain unintentional dictation errors in syntax, grammar, or spelling.                             PT Short Term Goals - 01/04/21 1505       PT SHORT TERM GOAL #1   Title Pt will be independent with HEP in order to improve strength and balance in order to decrease fall risk and improve function at home and work.    Baseline 01/04/2021= No formal HEP in place.    Time 6    Period Weeks    Status New    Target Date 02/15/21               PT Long Term Goals - 01/04/21 1506       PT LONG TERM GOAL #1   Title Patient will increase BLE gross strength by 1/2 muscle grade to improve functional strength for improved independence with potential gait, increased standing tolerance and increased ADL ability.    Baseline 01/04/2021- Patient presents with 1/5 to 3-/5 B LE strength with MMT.    Time 12    Period Weeks    Status New    Target Date 03/29/21      PT LONG TERM GOAL #2   Title Patient will tolerate sitting unsupported demonstrating erect sitting posture for 15 minutes with CGA to demonstrate improved back extensor strength and improved sitting tolerance.    Baseline 01/04/2021- Patient confied to sitting in lift chair or electric power chair with back support and unable to sit upright without physical assistance.    Time 12    Period Weeks    Status New    Target Date 03/29/21      PT LONG TERM GOAL #3   Title Patient will demonstrate ability to perform static standing in // bars > 2 min with Max Assist  without loss of balance and fair posture for improved overall strength for pre-gait and transfer activities.    Baseline 01/04/2021= Patient current uanble to stand- Dependent on hoyer or sit to stand lift for  transfers.  Time 12    Period Weeks    Status New    Target Date 03/29/21                   Plan - 01/18/21 1243     Clinical Impression Statement Patient demonstrated fair tolerance for therapeutic activities and therapeutic exercises to be performed in today's session.  Patient continues to have high levels of anxiety and fear when performing transfers to unfamiliar places such as using Hoyer lift to transfer to bariatric treatment table.  Patient also had very poor trunk control in seated position and required max assist from PT and caregiver in order to provide appropriate sitting posture that was comfortable for patient.  Patient also requests to have 2 staff working with him when performing activities such as transfers over to improve his levels of anxiety and improve his level of stress with these activities.  Patient is very motivated to improve and will continue to benefit from skilled physical therapy to improve his core strength, as well as to improve his quality of life.    Personal Factors and Comorbidities Comorbidity 3+;Time since onset of injury/illness/exacerbation    Comorbidities CVA, diabetes, Seizures    Examination-Activity Limitations Bathing;Bed Mobility;Bend;Caring for Others;Carry;Dressing;Hygiene/Grooming;Lift;Locomotion Level;Reach Overhead;Self Feeding;Sit;Squat;Stairs;Stand;Transfers;Toileting    Examination-Participation Restrictions Cleaning;Community Activity;Driving;Laundry;Medication Management;Meal Prep;Occupation;Personal Finances;Shop;Yard Work;Volunteer    Stability/Clinical Decision Making Evolving/Moderate complexity    Rehab Potential Fair    PT Frequency 2x / week    PT Duration 12 weeks    PT Treatment/Interventions ADLs/Self Care Home Management;Cryotherapy;Electrical Stimulation;Moist Heat;Ultrasound;DME Instruction;Gait training;Stair training;Functional mobility training;Therapeutic exercise;Balance training;Patient/family  education;Orthotic Fit/Training;Neuromuscular re-education;Wheelchair mobility training;Manual techniques;Passive range of motion;Dry needling;Energy conservation;Taping;Visual/perceptual remediation/compensation;Joint Manipulations    PT Next Visit Plan Instruct in home program exercises; transfer assessment when appropriate; Assess standing    PT Home Exercise Plan To be initiated next visit.    Consulted and Agree with Plan of Care Patient;Family member/caregiver    Family Member Consulted Mom- Paul Hall and Dad- [name]             Patient will benefit from skilled therapeutic intervention in order to improve the following deficits and impairments:  Abnormal gait, Decreased activity tolerance, Decreased balance, Decreased cognition, Cardiopulmonary status limiting activity, Decreased coordination, Decreased endurance, Decreased knowledge of use of DME, Decreased mobility, Decreased range of motion, Decreased safety awareness, Decreased strength, Difficulty walking, Hypomobility, Impaired perceived functional ability, Impaired flexibility, Impaired UE functional use, Impaired vision/preception, Improper body mechanics, Postural dysfunction, Obesity  Visit Diagnosis: Abnormality of gait and mobility  Difficulty in walking, not elsewhere classified  Muscle weakness (generalized)  Unsteadiness on feet     Problem List Patient Active Problem List   Diagnosis Date Noted   Sepsis due to vancomycin resistant Enterococcus species (HCC) 06/06/2019   SIRS (systemic inflammatory response syndrome) (HCC) 06/05/2019   Acute lower UTI 06/05/2019   VRE (vancomycin-resistant Enterococci) infection 06/05/2019   Anemia 06/05/2019   Skin ulcer of sacrum with necrosis of muscle (HCC)    Urinary retention    Type 2 diabetes mellitus without complication, with long-term current use of insulin (HCC)    Tachycardia    Lower extremity edema    Acute metabolic encephalopathy    Obstructive sleep apnea     Morbid obesity with BMI of 60.0-69.9, adult (HCC)    Goals of care, counseling/discussion    Palliative care encounter    Sepsis (HCC) 04/27/2019   H/O insulin dependent diabetes mellitus 04/27/2019   History of CVA with  residual deficit 04/27/2019   Seizure disorder (HCC) 04/27/2019   Decubitus ulcer of sacral region, stage 4 (HCC) 04/27/2019    Norman Herrlich, PT 01/19/2021, 9:51 AM  [facility] MAIN [facility] SERVICES [address] [place], [place], [address] Phone: [phone]   Fax:  [phone]  Name: Kaiyu Mirabal MRN: [id number] Date of Birth: 1995/05/06

## 2021-01-18 NOTE — Therapy (Signed)
[facility] MAIN [facility] SERVICES [address] [place], [place], [address] Phone: [phone]   Fax:  [phone]  Speech Language Pathology Treatment  Patient Details  Name: Paul Hall MRN: [id number] Date of Birth: 1996/02/26 Referring Provider (SLP): Lenise Herald   Encounter Date: 01/18/2021   End of Session - 01/18/21 1622     Visit Number 9    Number of Visits 25    Date for SLP Re-Evaluation 03/09/21    Authorization Type [employer] [place] [employer] PPO    Authorization Time Period 12/15/2020 thru 16/09/2020    Authorization - Visit Number 9    Authorization - Number of Visits 12    Progress Note Due on Visit 10    SLP Start Time 1100    SLP Stop Time  1200    SLP Time Calculation (min) 60 min    Activity Tolerance Patient tolerated treatment well             Past Medical History:  Diagnosis Date   Diabetes mellitus (HCC)    Hypertension    Stroke [facility])     Past Surgical History:  Procedure Laterality Date   IVC FILTER PLACEMENT ([facility] HX)     LEG SURGERY     PEG TUBE PLACEMENT     TRACHEOSTOMY      There were no vitals filed for this visit.   Subjective Assessment - 01/18/21 1619     Subjective "I am working on not repeating things so much"    Patient is accompained by: Family member    Currently in Pain? No/denies                   ADULT SLP TREATMENT - 01/18/21 0001       Treatment Provided   Treatment provided Cognitive-Linquistic      Cognitive-Linquistic Treatment   Treatment focused on Cognition;Patient/family/caregiver education    Skilled Treatment Introduced imaging to remember information -   Help for Memory p 101 - supervision cues for description - great description - P 102 - 90% with minimal cues              SLP Education - 01/18/21 1621     Education Details continue using verbal repetition as memory strategy    Person(s) Educated Patient;Parent(s)   mother and  father   Methods Explanation;Demonstration;Verbal cues    Comprehension Verbalized understanding;Need further instruction              SLP Short Term Goals - 01/01/21 0744       SLP SHORT TERM GOAL #1   Title With moderate cues, pt will increase vocal intensity to achieve > 95% speech intelligibility at the sentence level in a mildly noisey environment.    Time 10    Period --   sessions   Status On-going      SLP SHORT TERM GOAL #2   Title Pt will comprehend list of current medications to be able to answer yes/no questions with 755 accuracy given minimal assistance.    Baseline goal revised to address functional deficits    Time 10    Period --   sessions   Status Revised      SLP SHORT TERM GOAL #3   Title After listening to functional information, pt will recall 2 pieces of functional information with 50% accuracy given minimal assistance.    Baseline goal revised to address functional deficits    Time  10    Period --   sessions   Status Revised      SLP SHORT TERM GOAL #4   Title With moderate cues, pt will perform basic mental manipulation activities with 75% accuracy.    Time 10    Period --   sessions   Status On-going              SLP Long Term Goals - 01/01/21 0747       SLP LONG TERM GOAL #1   Title Pt will utilize speech intelligibility strategies to achieve > 95% intelligibility at the sentence level in a mildly noisy environment.    Time 12    Period Weeks    Status On-going    Target Date 03/09/21      SLP LONG TERM GOAL #2   Title Pt will comprehend his list of medications in order to recall 6 medications with minimal assistance.    Baseline revised on 01/01/2021 to address functional deficits    Time 12    Period Weeks    Status Revised    Target Date 03/09/21      SLP LONG TERM GOAL #3   Title Pt will verbally generate solutions to problems encountered in everyday living tasks with moderate assistance.    Baseline new goal added to  address functional deficits    Time 12    Period Weeks    Status New    Target Date 03/09/21              Plan - 01/18/21 1622     Clinical Impression Statement Pt continues to present with moderate cognitive communication deficits that are further complicated by deficits in vision and bilateral upper extremity weakness. Pt is currently using verbal repetition as a compensatory memory strategy. His mother would like for him to decrease his verbal repetition out of concern for possible social awkwardness. Education provided to pt's mother when she visited pt at end of OT session. Recommend pt continue using repetition as his memory decreases when his repetition decreases. As such, skilled ST intervention continues to be required to target his moderate cognitive communication impairments in an effort to increase his functional independence and reduce caregiver burden.    Speech Therapy Frequency 2x / week    Duration 12 weeks    Treatment/Interventions Language facilitation;Cueing hierarchy;Internal/external aids;Functional tasks;SLP instruction and feedback;Patient/family education;Cognitive reorganization;Compensatory strategies    Potential to Achieve Goals Fair    Potential Considerations Severity of impairments;Co-morbidities    SLP Home Exercise Plan provided, see pt instructions section    Consulted and Agree with Plan of Care Patient;Family member/caregiver    Family Member Consulted pt's dad and mother             Patient will benefit from skilled therapeutic intervention in order to improve the following deficits and impairments:   Cognitive communication deficit  History of CVA with residual deficit    Problem List Patient Active Problem List   Diagnosis Date Noted   Sepsis due to vancomycin resistant Enterococcus species (HCC) 06/06/2019   SIRS (systemic inflammatory response syndrome) (HCC) 06/05/2019   Acute lower UTI 06/05/2019   VRE (vancomycin-resistant  Enterococci) infection 06/05/2019   Anemia 06/05/2019   Skin ulcer of sacrum with necrosis of muscle (HCC)    Urinary retention    Type 2 diabetes mellitus without complication, with long-term current use of insulin (HCC)    Tachycardia    Lower extremity edema  Acute metabolic encephalopathy    Obstructive sleep apnea    Morbid obesity with BMI of 60.0-69.9, adult (HCC)    Goals of care, counseling/discussion    Palliative care encounter    Sepsis (HCC) 04/27/2019   H/O insulin dependent diabetes mellitus 04/27/2019   History of CVA with residual deficit 04/27/2019   Seizure disorder (HCC) 04/27/2019   Decubitus ulcer of sacral region, stage 4 (HCC) 04/27/2019   Sophie Quiles B. Dreama Saa M.S., CCC-SLP, [facility] Speech-Language Pathologist Rehabilitation Services Office [phone]  Reuel Derby 01/18/2021, 4:27 PM  [facility] MAIN [facility] SERVICES [address] [place], [place], [address] Phone: [phone]   Fax:  [phone]   Name: Jaysun Wessels MRN: [id number] Date of Birth: 03/19/1996

## 2021-01-18 NOTE — Patient Instructions (Signed)
Continue verbal repetition as Astronomer using imaging as a memory strategy

## 2021-01-20 ENCOUNTER — Ambulatory Visit: Payer: BC Managed Care – PPO

## 2021-01-20 ENCOUNTER — Ambulatory Visit: Payer: BC Managed Care – PPO | Admitting: Occupational Therapy

## 2021-01-20 ENCOUNTER — Ambulatory Visit: Payer: BC Managed Care – PPO | Admitting: Speech Pathology

## 2021-01-20 ENCOUNTER — Other Ambulatory Visit: Payer: Self-pay

## 2021-01-20 DIAGNOSIS — M6281 Muscle weakness (generalized): Secondary | ICD-10-CM

## 2021-01-20 DIAGNOSIS — R278 Other lack of coordination: Secondary | ICD-10-CM | POA: Diagnosis not present

## 2021-01-20 DIAGNOSIS — R269 Unspecified abnormalities of gait and mobility: Secondary | ICD-10-CM

## 2021-01-20 DIAGNOSIS — R41841 Cognitive communication deficit: Secondary | ICD-10-CM

## 2021-01-20 DIAGNOSIS — R471 Dysarthria and anarthria: Secondary | ICD-10-CM | POA: Diagnosis not present

## 2021-01-20 DIAGNOSIS — I6930 Unspecified sequelae of cerebral infarction: Secondary | ICD-10-CM

## 2021-01-20 DIAGNOSIS — R262 Difficulty in walking, not elsewhere classified: Secondary | ICD-10-CM

## 2021-01-20 DIAGNOSIS — R2681 Unsteadiness on feet: Secondary | ICD-10-CM | POA: Diagnosis not present

## 2021-01-20 NOTE — Therapy (Signed)
[place] [facility] MAIN [facility] SERVICES [address] [place], [place], [address] Phone: [phone]   Fax:  [phone]  Physical Therapy Treatment  Patient Details  Name: Paul Hall MRN: [id number] Date of Birth: Mar 22, 1996 Referring Provider (PT): Lenise Herald, PA-C   Encounter Date: 01/20/2021   PT End of Session - 01/20/21 1014     Visit Number 3    Number of Visits 25    Date for PT Re-Evaluation 03/29/21    Authorization Type [employer] and [employer]- Need auth past 12th visit    Authorization - Visit Number 3    Authorization - Number of Visits 12    Progress Note Due on Visit 10    PT Start Time 1015    PT Stop Time 1059    PT Time Calculation (min) 44 min    Equipment Utilized During Treatment Gait belt;Other (comment)   hoyer lift   Activity Tolerance Patient tolerated treatment well    Behavior During Therapy [facility] for tasks assessed/performed;Anxious             Past Medical History:  Diagnosis Date   Diabetes mellitus (HCC)    Hypertension    Stroke [facility])     Past Surgical History:  Procedure Laterality Date   IVC FILTER PLACEMENT ([facility] HX)     LEG SURGERY     PEG TUBE PLACEMENT     TRACHEOSTOMY      There were no vitals filed for this visit.   Subjective Assessment - 01/20/21 1119     Subjective Patient presents with his dad. Continues to request two therapists for transfers/during session as possible for comfort. Has been compliant with HEP.    Patient is accompained by: Family member   Mom- Paul Hall; Dad= Paul Hall   Pertinent History Patient is a 25 y.o. male with insulin dependent diabetes and morbid obesity (BMI 65) who was admitted with ARDS in the setting of Covid-19 On10/18/2020 History taken per admission note dated 01/20/2019. On 11/13, the patient was noticed to have RUE twitching. A noncontrast head CT was done and discovered an ischemic stroke and a subdural hemorrhage with slight midline shift.  ICP management was initiated by keeping Na > 145 (on 235ml/hr normal saline) and head of the bed > 90 degrees. Neurosurgery was consulted and does not recommend surgical decompression at this time. His heparin gtt that was being used to treat his bilateral DVTs was stopped and vascular surgery was consulted to place and IVC filter on 11/14. On 11/15 he was transferred back to the MICU after his CVAs did not worsen or evolve. He was not able to be weaned from the vent and underwent trach placement on 11/18 and G-tube placement on 11/19.    On 11/26 patient was noted to have "inward eye deviation" and "downward eye deviation with occasional twitch in the left eye" per nursing, concerning for seizure-like activity. He was restarted on cvEEG today and continued to Keppra 1 g BID. CT head wo contrast was obtained which showed new left frontal IPH with 1 cm midline shift and subfalcine herniation, and prior large left hemispheric infarct. . The patient's neurological exam has remained stable with brainstem reflexes intact, but functionally quadriplegic. with inward/downward eye deviation on 11/26 while on maintenance Keppra. There is no pathological intracranial focality in epilepsy that would make the patient's gaze present in such a manner. There was mild dysconjugate gaze with the right eye out, however, this was not fixed,  there was no rhythmic, stereotyped movements to suggest seizures. cvEEG monitoring 11/27-11/28 revealed no seizures only focal slowing consistent with his known vascular insult. There was a push button event for extremity shaking that had no ccvEEG correlate.  - Keppra 2 g BID.     Subacute ischemic stroke (left, ACA/MCA/PCA distribution)  Likely secondary to COVID hypercoagulable state. Carotid Dopplers negative. Stroke likely subacute given 3 serial CTs demonstrated no evolution of stroke size. Will get statin once CK/rhabdomyolysis and abnl LFTs resolve. 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    Limitations Lifting;Standing;Walking;Writing;Reading;Sitting;House hold activities    How long can you sit comfortably? Patient reports uable to sit up long < 4 hours due to buttock discomfort    How long can you stand comfortably? Patient is currently unable to stand    How long can you walk comfortably? Patient is currently unable to walk    Diagnostic tests FINDINGS:  Brain: Left cerebral convexity cortical hypodensity with associated  cortical calcification extending from the frontal to the parietal  lobe. Extensive area of volume loss and low-density compatible with  chronic infarction. This is noted on prior reports from [facility].  Probable chronic watershed infarct. Additional small areas of  hypodensity in the right frontal white matter consistent with  chronic ischemia.     Negative for acute infarct. No acute hemorrhage or mass. Ventricle  size normal without midline shift.     Vascular: Normal arterial flow voids.     Skull: Negative     Sinuses/Orbits: Mucosal edema paranasal sinuses with air-fluid level  left sphenoid sinus. Negative orbit     Other: None     IMPRESSION:  Extensive watershed type infarct over the left cerebral convexity  with cortical calcification. This appears chronic. No associated  hemorrhage, hydrocephalus, or midline shift.     These results were called by telephone at the time of interpretation  on 05/09/2019 at 3:12 pm to provider Alford Highland , who verbally  acknowledged these results.        Electronically Signed    By: Marlan Palau M.D.    On: 05/09/2019 15:13       Result History    CT HEAD WO CONTRAST (Order #[id number]) on 05/09/2019 - Order Result History Report    Patient Stated Goals I want to be able to possibly walk again    Currently in Pain? No/denies    Pain Onset More than a month ago                   Transfer from Power wheelchair to treatment table utilizing hoyer lift and 3 x assist with 2 staff and a  SPT.    Seated with pillow behind head on bariatric lift table with back inclined and foot rest down.  -weight shift forward/backwards 5x with 30 second holds with decreasing assistance from min A to CGA -static sit without back rest and hands on knees no stabilization from PT. PT on either side of patient for increased safety, main PT in front of patient. 45-60 second holds x 4 trials -lateral weight shift 5x each side, PT on either side of patient, patient able to transition weight but unable to clear bottom  -lateral weight shift with arm press into table; 5x each side, very challenging to R side.  -gluteal squeezes 10x 3 second holds -hip flexion march with modified LAQ 10x each LE, patient more challenged with RLE -hip adduction isometric  into PT hands 10x 3 second holds -ankle AAROM with PT assistance 10x df/pf  Shoes doffed/donned for session.   Pt educated throughout session about proper posture and technique with exercises. Improved exercise technique, movement at target joints, use of target muscles after min to mod verbal, visual, tactile cues.    Patient is highly motivated throughout session and able to perform seated stabilization without support for prolonged holds. Patient and patient's dad reports rolling is challenging for patient and will be a potential intervention to focus on next session. Patient is more challenged with RLE activation than LLE. Lateral weight shifts are more challenging than L sided. A minimum of two therapists will benefit patient at this time for transfers and session due to high level of need for assistance. Patient does have LE strength with RLE challenged more than LLE. Patient is very motivated to improve and will continue to benefit from skilled physical therapy to improve his core strength, as well as to improve his quality of life.        PT Education - 01/20/21 1015     Education Details exercise technique, body mechanics    Person(s)  Educated Patient    Methods Explanation;Demonstration;Tactile cues;Verbal cues    Comprehension Verbalized understanding;Returned demonstration;Verbal cues required;Tactile cues required              PT Short Term Goals - 01/04/21 1505       PT SHORT TERM GOAL #1   Title Pt will be independent with HEP in order to improve strength and balance in order to decrease fall risk and improve function at home and work.    Baseline 01/04/2021= No formal HEP in place.    Time 6    Period Weeks    Status New    Target Date 02/15/21               PT Long Term Goals - 01/19/21 0951       PT LONG TERM GOAL #1   Title Patient will increase BLE gross strength by 1/2 muscle grade to improve functional strength for improved independence with potential gait, increased standing tolerance and increased ADL ability.    Baseline 01/04/2021- Patient presents with 1/5 to 3-/5 B LE strength with MMT.    Time 12    Period Weeks    Status New    Target Date 03/29/21      PT LONG TERM GOAL #2   Title Patient will tolerate sitting unsupported demonstrating erect sitting posture for 15 minutes with CGA to demonstrate improved back extensor strength and improved sitting tolerance.    Baseline 01/04/2021- Patient confied to sitting in lift chair or electric power chair with back support and unable to sit upright without physical assistance.    Time 12    Period Weeks    Status New    Target Date 03/29/21      PT LONG TERM GOAL #3   Title Patient will demonstrate ability to perform static standing in // bars > 2 min with Max Assist  without loss of balance and fair posture for improved overall strength for pre-gait and transfer activities.    Baseline 01/04/2021= Patient current uanble to stand- Dependent on hoyer or sit to stand lift for transfers.    Time 12    Period Weeks    Status New    Target Date 03/29/21      PT LONG TERM GOAL #4   Title Pt will improve FOTO  score by 10 points or more  demonstrating improved perceived functional ability    Baseline FOTO 7 on 10/17    Time 12    Period Weeks    Status New    Target Date 03/29/21                   Plan - 01/20/21 1122     Clinical Impression Statement Patient is highly motivated throughout session and able to perform seated stabilization without support for prolonged holds. Patient and patient's dad reports rolling is challenging for patient and will be a potential intervention to focus on next session. Patient is more challenged with RLE activation than LLE. Lateral weight shifts are more challenging than L sided. A minimum of two therapists will benefit patient at this time for transfers and session due to high level of need for assistance. Patient does have LE strength with RLE challenged more than LLE. Patient is very motivated to improve and will continue to benefit from skilled physical therapy to improve his core strength, as well as to improve his quality of life.    Personal Factors and Comorbidities Comorbidity 3+;Time since onset of injury/illness/exacerbation    Comorbidities CVA, diabetes, Seizures    Examination-Activity Limitations Bathing;Bed Mobility;Bend;Caring for Others;Carry;Dressing;Hygiene/Grooming;Lift;Locomotion Level;Reach Overhead;Self Feeding;Sit;Squat;Stairs;Stand;Transfers;Toileting    Examination-Participation Restrictions Cleaning;Community Activity;Driving;Laundry;Medication Management;Meal Prep;Occupation;Personal Finances;Shop;Yard Work;Volunteer    Stability/Clinical Decision Making Evolving/Moderate complexity    Rehab Potential Fair    PT Frequency 2x / week    PT Duration 12 weeks    PT Treatment/Interventions ADLs/Self Care Home Management;Cryotherapy;Electrical Stimulation;Moist Heat;Ultrasound;DME Instruction;Gait training;Stair training;Functional mobility training;Therapeutic exercise;Balance training;Patient/family education;Orthotic Fit/Training;Neuromuscular  re-education;Wheelchair mobility training;Manual techniques;Passive range of motion;Dry needling;Energy conservation;Taping;Visual/perceptual remediation/compensation;Joint Manipulations    PT Next Visit Plan Instruct in home program exercises; transfer assessment when appropriate; Assess standing    PT Home Exercise Plan To be initiated next visit.    Consulted and Agree with Plan of Care Patient;Family member/caregiver    Family Member Consulted Mom- Paul Hall and Dad- [name]             Patient will benefit from skilled therapeutic intervention in order to improve the following deficits and impairments:  Abnormal gait, Decreased activity tolerance, Decreased balance, Decreased cognition, Cardiopulmonary status limiting activity, Decreased coordination, Decreased endurance, Decreased knowledge of use of DME, Decreased mobility, Decreased range of motion, Decreased safety awareness, Decreased strength, Difficulty walking, Hypomobility, Impaired perceived functional ability, Impaired flexibility, Impaired UE functional use, Impaired vision/preception, Improper body mechanics, Postural dysfunction, Obesity  Visit Diagnosis: Abnormality of gait and mobility  Difficulty in walking, not elsewhere classified  Muscle weakness (generalized)  Unsteadiness on feet     Problem List Patient Active Problem List   Diagnosis Date Noted   Sepsis due to vancomycin resistant Enterococcus species (HCC) 06/06/2019   SIRS (systemic inflammatory response syndrome) (HCC) 06/05/2019   Acute lower UTI 06/05/2019   VRE (vancomycin-resistant Enterococci) infection 06/05/2019   Anemia 06/05/2019   Skin ulcer of sacrum with necrosis of muscle (HCC)    Urinary retention    Type 2 diabetes mellitus without complication, with long-term current use of insulin (HCC)    Tachycardia    Lower extremity edema    Acute metabolic encephalopathy    Obstructive sleep apnea    Morbid obesity with BMI of 60.0-69.9, adult  (HCC)    Goals of care, counseling/discussion    Palliative care encounter    Sepsis (HCC) 04/27/2019   H/O insulin dependent diabetes mellitus 04/27/2019  History of CVA with residual deficit 04/27/2019   Seizure disorder (HCC) 04/27/2019   Decubitus ulcer of sacral region, stage 4 (HCC) 04/27/2019    Precious Bard, PT, DPT  01/20/2021, 11:23 AM  [place] [facility] MAIN [facility] SERVICES [address] [place], [place], [address] Phone: [phone]   Fax:  [phone]  Name: Paul Hall MRN: [id number] Date of Birth: Aug 16, 1995

## 2021-01-20 NOTE — Therapy (Addendum)
[place] [facility] MAIN [facility] SERVICES [address] [place], [place], [address] Phone: [phone]   Fax:  [phone]  Occupational Therapy Progress Note  Dates of reporting period  12/15/2020   to   01/20/2021      Patient Details  Name: Paul Hall MRN: [id number] Date of Birth: Feb 28, 1996 Referring Provider (OT): Lenise Herald   Encounter Date: 01/20/2021   OT End of Session - 01/20/21 1405     Visit Number 10    Number of Visits 24    Date for OT Re-Evaluation 03/24/21    Authorization Type Progress report period starting 12/15/2020    OT Start Time 1300    OT Stop Time 1350    OT Time Calculation (min) 50 min    Activity Tolerance Patient tolerated treatment well    Behavior During Therapy [name] for tasks assessed/performed;Anxious             Past Medical History:  Diagnosis Date   Diabetes mellitus (HCC)    Hypertension    Stroke [facility])     Past Surgical History:  Procedure Laterality Date   IVC FILTER PLACEMENT ([facility] HX)     LEG SURGERY     PEG TUBE PLACEMENT     TRACHEOSTOMY      There were no vitals filed for this visit.  OT TREATMENT     Estim attended:   Pt. tolerated neuromuscular estim  for 8 min., intensity 28, cycle time 10/10,  duty cycle 50%. Applied to the right wrist, and digit extensors, followed by  8 min. Applied to the left wrist, and digit extensors with constant monitoring 100% of the time, and with the intensity set to 38. Manual assist was required for  wrist and digit extension with each rep. To hold the position.    Therapeutic Exercise:   Patient tolerated active range of motion reps for bilateral shoulder flexion abduction. Passive range of motion for bilateral elbow flexion and extension with slow prolonged gentle stretching for elbow extension, wrist extension, and digit extension. Patient tolerated moist heat modality to his bilateral hands prior to range of motion.   Measurements  were obtained and goals were reviewed with the pt. Pt. Has progressed with the FOTO score to 42 with TR score of 49. Pt. has improved with ROM for the right shoulder flexion, and abduction. Pt. Is tolerating Estim well to the right, and left wrist, and digit extensors with manual assist. Pt. continues to require assist with self-feeding, and self-grooming tasks. Patient continues to work on normalizing tone in the bilateral upper extremities in order to improve range of motion,  and facilitate active range of motion in order to increase engagement in ADLs and IADL tasks.                                      OT Long Term Goals - 01/20/21 1311       OT LONG TERM GOAL #1   Title Pt. will improve FOTO score by 2 points for clinically significant improvements during ADLs, and IADLs,    Baseline 10th Visit: FOTO score 42 Eval: FOTO score: 41    Time 12    Period Weeks    Status On-going    Target Date 03/24/21      OT LONG TERM GOAL #2   Title Pt. will improve right shoulder flexion by 10  degrees to assist caregivers with UE dressing    Baseline 10th visit: ROM  R 65 scaption(95) Attempts for Active flexion result in scaption. Eval: R: 62(94), L: 114(120    Time 12    Period Weeks    Status On-going    Target Date 03/24/21      OT LONG TERM GOAL #3   Title Pt. will improve right Abduction by 10 degrees to assist caregivers with underarm hygiene care.    Baseline 10th visit: 86(95) Eval: R: 76(93)    Time 12    Period Weeks    Status On-going    Target Date 03/24/21      OT LONG TERM GOAL #4   Title Pt. will demonstrate visual compensatory strategies for navigating through his environment.    Baseline 10th visit: continues to present with limited vision. Eval: Limited    Time 12    Period Weeks    Status On-going    Target Date 03/24/21      OT LONG TERM GOAL #5   Title Pt. will demonstrate visual compensatory strategies for tabletop tasks within his near  space.    Baseline 10th visit: Pt. continues to present with limited vision. Eval: Pt. has difficulty    Time 12    Period Weeks    Target Date 03/24/21      OT LONG TERM GOAL #6   Title Pt. will perform hand to mouth patterns with min A in preparation for self-feeding.    Baseline 10th visit: Pt. is able to perfrom hand to mouth patterns, however reports weakness in his hand  limits a secure grasp on the utensi Pt. requires assist to perform. Eval: Pt. has difficulty    Time 12    Period Weeks    Status On-going    Target Date 03/24/21      OT LONG TERM GOAL #7   Title Pt. will perform one step self-grooming tasks with minA.    Baseline 10th visit: Pt. has difficulty sustaining the UEs in elevation for the duration required to perfrom self-grooming tasks thoroughly.Eval: pt. has difficulty    Time 12    Period Weeks    Status On-going    Target Date 03/24/21                   Plan - 01/20/21 1407     Clinical Impression Statement Measurements were obtained and goals were reviewed with the pt. Pt. Has progressed with the FOTO score to 42 with TR score of 49. Pt. has improved with ROM for the right shoulder flexion, and abduction. Pt. Is tolerating Estim well to the right, and left wrist, and digit extensors with manual assist. Pt. continues to require assist with self-feeding, and self-grooming tasks. Patient continues to work on normalizing tone in the bilateral upper extremities in order to improve range of motion,  and facilitate active range of motion in order to increase engagement in ADLs and IADL tasks.                   OT Occupational Profile and History Detailed Assessment- Review of Records and additional review of physical, cognitive, psychosocial history related to current functional performance    Occupational performance deficits (Please refer to evaluation for details): ADL's;IADL's    Body Structure / Function / Physical Skills  ADL;FMC;ROM;Dexterity;IADL;Endurance;Obesity;Strength;UE functional use    Rehab Potential Good    Clinical Decision Making Multiple treatment options, significant modification of task necessary  Comorbidities Affecting Occupational Performance: Presence of comorbidities impacting occupational performance    Modification or Assistance to Complete Evaluation  Max significant modification of tasks or assist is necessary to complete    OT Frequency 2x / week    OT Duration 12 weeks    OT Treatment/Interventions Self-care/ADL training;Therapeutic exercise;Neuromuscular education;Visual/perceptual remediation/compensation;Patient/family education;Therapeutic activities;DME and/or AE instruction;Passive range of motion;Moist Heat;Electrical Stimulation    Consulted and Agree with Plan of Care Patient             Patient will benefit from skilled therapeutic intervention in order to improve the following deficits and impairments:   Body Structure / Function / Physical Skills: ADL, FMC, ROM, Dexterity, IADL, Endurance, Obesity, Strength, UE functional use       Visit Diagnosis: Muscle weakness (generalized)    Problem List Patient Active Problem List   Diagnosis Date Noted   Sepsis due to vancomycin resistant Enterococcus species (HCC) 06/06/2019   SIRS (systemic inflammatory response syndrome) (HCC) 06/05/2019   Acute lower UTI 06/05/2019   VRE (vancomycin-resistant Enterococci) infection 06/05/2019   Anemia 06/05/2019   Skin ulcer of sacrum with necrosis of muscle (HCC)    Urinary retention    Type 2 diabetes mellitus without complication, with long-term current use of insulin (HCC)    Tachycardia    Lower extremity edema    Acute metabolic encephalopathy    Obstructive sleep apnea    Morbid obesity with BMI of 60.0-69.9, adult (HCC)    Goals of care, counseling/discussion    Palliative care encounter    Sepsis (HCC) 04/27/2019   H/O insulin dependent diabetes mellitus  04/27/2019   History of CVA with residual deficit 04/27/2019   Seizure disorder (HCC) 04/27/2019   Decubitus ulcer of sacral region, stage 4 (HCC) 04/27/2019    Olegario Messier, MS, OTR/L 01/20/2021, 2:11 PM  [place] [facility] MAIN [facility] SERVICES [address] [place], [place], [address] Phone: [phone]   Fax:  [phone]  Name: Paul Hall MRN: [id number] Date of Birth: [birth year]

## 2021-01-21 DIAGNOSIS — G4733 Obstructive sleep apnea (adult) (pediatric): Secondary | ICD-10-CM | POA: Diagnosis not present

## 2021-01-21 NOTE — Patient Instructions (Signed)
Verbal description task

## 2021-01-21 NOTE — Therapy (Signed)
[place] MAIN [facility] SERVICES [address] [place], [place], [address] Phone: [phone]   Fax:  [phone]  Speech Language Pathology Treatment Speech Therapy Progress Note   Dates of reporting period  12/15/2020   to   01/20/2021  Patient Details  Name: Paul Hall MRN: [id number] Date of Birth: 05-Oct-1995 Referring Provider (SLP): Collene Mares  Speech Therapy Progress Note   Dates of Reporting Period: 12/15/2020 to 01/20/2021   Objective: Patient has been seen for 10 speech therapy sessions this reporting period targeting cognitive communication. Patient is making steady progress towards goals and he has met 4/4 STGs and 2/3 LTGs this reporting period. See skilled intervention, clinical impressions, and goals below for details.  Encounter Date: 01/20/2021   End of Session - 01/21/21 0852     Visit Number 10    Number of Visits 25    Date for SLP Re-Evaluation 03/09/21    Authorization Type [employer] [place] COMM PPO    Authorization Time Period 12/15/2020 thru 16/09/2020    Authorization - Visit Number 10    Authorization - Number of Visits 12    Progress Note Due on Visit 10    SLP Start Time 1100    SLP Stop Time  1200    SLP Time Calculation (min) 60 min    Activity Tolerance Patient tolerated treatment well             Past Medical History:  Diagnosis Date   Diabetes mellitus ([place])    Hypertension    Stroke [facility])     Past Surgical History:  Procedure Laterality Date   IVC FILTER PLACEMENT ([facility] HX)     LEG SURGERY     PEG TUBE PLACEMENT     TRACHEOSTOMY      There were no vitals filed for this visit.   Subjective Assessment - 01/21/21 0848     Subjective pt pleasant, accompanied by his father    Patient is accompained by: Family member    Currently in Pain? No/denies                   ADULT SLP TREATMENT - 01/21/21 0001       Treatment Provided   Treatment provided  Cognitive-Linquistic      Cognitive-Linquistic Treatment   Treatment focused on Cognition;Patient/family/caregiver education    Skilled Treatment COGNITION COMMUNICATION: Pt demonstrated great improvement in ability to express his thoughts/concerns regarding cognitive communication recommendations made by SLP. Pt's father present and support. Pt required intermittent cues to finish his thoughts in effort to fully convey all information. Introduced Curator task. After instruction, pt began demonstrating increased carryover of cues.              SLP Education - 01/21/21 0849     Education Details strategies to increase verbal organization, verbal description    Person(s) Educated Patient;Parent(s)    Methods Explanation;Demonstration;Verbal cues;Handout    Comprehension Verbalized understanding;Need further instruction              SLP Short Term Goals - 01/21/21 0017       SLP SHORT TERM GOAL #1   Title Using a good quality loud voice, pt will recall 2 of 3 memory strategies given minimal verbal cues.    Baseline goal met, revised to reflect progress    Time 10    Period --   sessions   Status Revised      SLP SHORT  TERM GOAL #2   Title Pt will recall 5 or more items (medication list) after a 15 minute delay given moderate cues in order to increase independent during functional memory tasks.    Baseline goal revised to address functional deficits    Time 10    Period --   sessions   Status Revised      SLP SHORT TERM GOAL #3   Title After listening to functional information, pt will recall 2 pieces of functional information with 95% accuracy given minimal assistance.    Baseline goal met, revised to reflect progress    Time 10    Period --   sessions   Status Revised      SLP SHORT TERM GOAL #4   Title With minimal cues, pt will perform basic mental manipulation activities with 75% accuracy.    Baseline goal met, goal revised to reflect progress    Time  10    Period --   sessions   Status Revised              SLP Long Term Goals - 01/21/21 1404       SLP LONG TERM GOAL #1   Title Using a good quality loud voice, pt will record a journal of daily activities to aid in recall of information.    Baseline goal met, revised to reflect progress    Time 12    Period Weeks    Status Revised    Target Date 03/09/21      SLP LONG TERM GOAL #2   Title Pt will comprehend his list of medications in order to recall 6 medications with minimal assistance.    Status On-going    Target Date 03/09/21      SLP LONG TERM GOAL #3   Title Pt will verbally generate solutions to problems encountered in everyday living tasks with minimal assistance.    Baseline goal met, revised to reflect pt progress    Time 12    Period Weeks    Status Revised    Target Date 03/09/21               Patient will benefit from skilled therapeutic intervention in order to improve the following deficits and impairments:   Cognitive communication deficit  History of CVA with residual deficit    Problem List Patient Active Problem List   Diagnosis Date Noted   Sepsis due to vancomycin resistant Enterococcus species ([place]) 06/06/2019   SIRS (systemic inflammatory response syndrome) ([place]) 06/05/2019   Acute lower UTI 06/05/2019   VRE (vancomycin-resistant Enterococci) infection 06/05/2019   Anemia 06/05/2019   Skin ulcer of sacrum with necrosis of muscle ([place])    Urinary retention    Type 2 diabetes mellitus without complication, with long-term current use of insulin ([facility])    Tachycardia    Lower extremity edema    Acute metabolic encephalopathy    Obstructive sleep apnea    Morbid obesity with BMI of 60.0-69.9, adult ([place])    Goals of care, counseling/discussion    Palliative care encounter    Sepsis ([place]) 04/27/2019   H/O insulin dependent diabetes mellitus 04/27/2019   History of CVA with residual deficit 04/27/2019   Seizure disorder ([place])  04/27/2019   Decubitus ulcer of sacral region, stage 4 ([place]) 04/27/2019   Simonne Boulos B. Rutherford Nail M.S., CCC-SLP, [facility] Speech-Language Pathologist Rehabilitation Services Office [phone]  Stormy Fabian 01/21/2021, 2:14 PM  [place] MAIN [facility] SERVICES  [place], [place], [address] Phone: [phone]   Fax:  [phone]   Name: Isaid Salvia MRN: [id number] Date of Birth: 25-Mar-1996

## 2021-01-25 ENCOUNTER — Ambulatory Visit: Payer: BC Managed Care – PPO

## 2021-01-25 ENCOUNTER — Ambulatory Visit: Payer: BC Managed Care – PPO | Admitting: Speech Pathology

## 2021-01-25 ENCOUNTER — Ambulatory Visit: Payer: BC Managed Care – PPO | Admitting: Occupational Therapy

## 2021-01-27 ENCOUNTER — Ambulatory Visit: Payer: BC Managed Care – PPO

## 2021-01-27 ENCOUNTER — Encounter: Payer: Self-pay | Admitting: Occupational Therapy

## 2021-01-27 ENCOUNTER — Ambulatory Visit: Payer: BC Managed Care – PPO | Admitting: Occupational Therapy

## 2021-01-27 ENCOUNTER — Other Ambulatory Visit: Payer: Self-pay

## 2021-01-27 ENCOUNTER — Encounter: Payer: BC Managed Care – PPO | Admitting: Speech Pathology

## 2021-01-27 ENCOUNTER — Ambulatory Visit: Payer: BC Managed Care – PPO | Admitting: Speech Pathology

## 2021-01-27 ENCOUNTER — Encounter: Payer: BC Managed Care – PPO | Admitting: Occupational Therapy

## 2021-01-27 DIAGNOSIS — R471 Dysarthria and anarthria: Secondary | ICD-10-CM | POA: Diagnosis not present

## 2021-01-27 DIAGNOSIS — R2681 Unsteadiness on feet: Secondary | ICD-10-CM | POA: Diagnosis not present

## 2021-01-27 DIAGNOSIS — I6930 Unspecified sequelae of cerebral infarction: Secondary | ICD-10-CM | POA: Diagnosis not present

## 2021-01-27 DIAGNOSIS — R41841 Cognitive communication deficit: Secondary | ICD-10-CM | POA: Diagnosis not present

## 2021-01-27 DIAGNOSIS — R262 Difficulty in walking, not elsewhere classified: Secondary | ICD-10-CM

## 2021-01-27 DIAGNOSIS — R269 Unspecified abnormalities of gait and mobility: Secondary | ICD-10-CM | POA: Diagnosis not present

## 2021-01-27 DIAGNOSIS — R278 Other lack of coordination: Secondary | ICD-10-CM | POA: Diagnosis not present

## 2021-01-27 DIAGNOSIS — M6281 Muscle weakness (generalized): Secondary | ICD-10-CM

## 2021-01-27 NOTE — Therapy (Signed)
[place] MAIN [facility] SERVICES [address] [place], [place], [address] Phone: [phone]   Fax:  [phone]  Speech Language Pathology Treatment  Patient Details  Name: Paul Hall MRN: [id number] Date of Birth: Mar 02, 1996 Referring Provider (SLP): Collene Mares   Encounter Date: 01/27/2021   End of Session - 01/27/21 1350     Visit Number 11    Number of Visits 25    Date for SLP Re-Evaluation 03/09/21    Authorization Type [employer] [place] COMM PPO    Authorization Time Period 12/15/2020 thru 16/09/2020    Authorization - Visit Number 11    Authorization - Number of Visits 12    Progress Note Due on Visit 10    SLP Start Time 7591    SLP Stop Time  1330    SLP Time Calculation (min) 60 min    Activity Tolerance Patient tolerated treatment well             Past Medical History:  Diagnosis Date   Diabetes mellitus ([place])    Hypertension    Stroke [facility])     Past Surgical History:  Procedure Laterality Date   IVC FILTER PLACEMENT ([place] HX)     LEG SURGERY     PEG TUBE PLACEMENT     TRACHEOSTOMY      There were no vitals filed for this visit.   Subjective Assessment - 01/27/21 1338     Subjective Patient reported fatigue with prior therapies this AM though pleasant and motivated for therapy. Father present for session    Patient is accompained by: Family member    Currently in Pain? No/denies                   ADULT SLP TREATMENT - 01/27/21 0001       General Information   Behavior/Cognition Alert;Cooperative;Pleasant mood      Treatment Provided   Treatment provided Cognitive-Linquistic      Cognitive-Linquistic Treatment   Treatment focused on Cognition    Skilled Treatment COGNITIVE COMMUNICATION: Patient demonstrated recall of memory strategies with 33% acc x3 IND recalled quiet repetition though required minimal assist to recall secondary strategy visualizaiton. [place]  demosntrated recall of functional information with 76% accuracy x29 opportunities given paragraph level auditory information with 3-5 elements--task completed with minimal assist with breakdown of questioning for inclusive items prior to exclusion.              SLP Education - 01/27/21 1350     Education Details memory strategies    Person(s) Educated Patient    Methods Explanation    Comprehension Verbalized understanding              SLP Short Term Goals - 01/21/21 0852       SLP SHORT TERM GOAL #1   Title Using a good quality loud voice, pt will recall 2 of 3 memory strategies given minimal verbal cues.    Baseline goal met, revised to reflect progress    Time 10    Period --   sessions   Status Revised      SLP SHORT TERM GOAL #2   Title Pt will recall 5 or more items (medication list) after a 15 minute delay given moderate cues in order to increase independent during functional memory tasks.    Baseline goal revised to address functional deficits    Time 10    Period --   sessions  Status Revised      SLP SHORT TERM GOAL #3   Title After listening to functional information, pt will recall 2 pieces of functional information with 95% accuracy given minimal assistance.    Baseline goal met, revised to reflect progress    Time 10    Period --   sessions   Status Revised      SLP SHORT TERM GOAL #4   Title With minimal cues, pt will perform basic mental manipulation activities with 75% accuracy.    Baseline goal met, goal revised to reflect progress    Time 10    Period --   sessions   Status Revised              SLP Long Term Goals - 01/21/21 1404       SLP LONG TERM GOAL #1   Title Using a good quality loud voice, pt will record a journal of daily activities to aid in recall of information.    Baseline goal met, revised to reflect progress    Time 12    Period Weeks    Status Revised    Target Date 03/09/21      SLP LONG TERM GOAL #2   Title Pt  will comprehend his list of medications in order to recall 6 medications with minimal assistance.    Status On-going    Target Date 03/09/21      SLP LONG TERM GOAL #3   Title Pt will verbally generate solutions to problems encountered in everyday living tasks with minimal assistance.    Baseline goal met, revised to reflect pt progress    Time 12    Period Weeks    Status Revised    Target Date 03/09/21              Plan - 01/27/21 1351     Clinical Impression Statement Pt continues to present with moderate cognitive communication deficits that are further complicated by deficits in vision and bilateral upper extremity weakness. Pt is currently using quiet verbal repetition and visualization as a compensatory memory strategy. Patient is challenged by complex questions such as exclusion of information though benefits from breakdown of information prior to such challenges. As such, skilled ST intervention continues to be required to target his moderate cognitive communication impairments in an effort to increase his functional independence and reduce caregiver burden.    Speech Therapy Frequency 2x / week    Duration 12 weeks    Treatment/Interventions Language facilitation;Cueing hierarchy;Internal/external aids;Functional tasks;SLP instruction and feedback;Patient/family education;Cognitive reorganization;Compensatory strategies    Potential to Achieve Goals Fair    Potential Considerations Severity of impairments;Co-morbidities    SLP Home Exercise Plan provided, see pt instructions section    Consulted and Agree with Plan of Care Patient;Family member/caregiver    Family Member Consulted pt's dad             Patient will benefit from skilled therapeutic intervention in order to improve the following deficits and impairments:   Cognitive communication deficit  History of CVA with residual deficit    Problem List Patient Active Problem List   Diagnosis Date Noted    Sepsis due to vancomycin resistant Enterococcus species ([place]) 06/06/2019   SIRS (systemic inflammatory response syndrome) ([place]) 06/05/2019   Acute lower UTI 06/05/2019   VRE (vancomycin-resistant Enterococci) infection 06/05/2019   Anemia 06/05/2019   Skin ulcer of sacrum with necrosis of muscle ([place])    Urinary retention    Type  2 diabetes mellitus without complication, with long-term current use of insulin ([facility])    Tachycardia    Lower extremity edema    Acute metabolic encephalopathy    Obstructive sleep apnea    Morbid obesity with BMI of 60.0-69.9, adult ([facility])    Goals of care, counseling/discussion    Palliative care encounter    Sepsis ([place]) 04/27/2019   H/O insulin dependent diabetes mellitus 04/27/2019   History of CVA with residual deficit 04/27/2019   Seizure disorder ([place]) 04/27/2019   Decubitus ulcer of sacral region, stage 4 ([place]) 04/27/2019   Ethelene Hal, M.A. CCC-SLP   [place] 01/27/2021, 1:54 PM  [place] MAIN [facility] SERVICES [address] [place], [place], [address] Phone: [phone]   Fax:  [phone]   Name: Tor Tsuda MRN: [id number] Date of Birth: 1996-03-12

## 2021-01-27 NOTE — Therapy (Signed)
[facility] MAIN [facility] SERVICES [address] [place], [place], [address] Phone: [phone]   Fax:  [phone]  Physical Therapy Treatment  Patient Details  Name: Paul Hall MRN: [id number] Date of Birth: 1996-02-25 Referring Provider (PT): Paul Herald, PA-C   Encounter Date: 01/27/2021   PT End of Session - 01/27/21 1551     Visit Number 4    Number of Visits 25    Date for PT Re-Evaluation 03/29/21    Authorization Type [employer] and [employer]- Need auth past 12th visit    Authorization - Number of Visits 12    Progress Note Due on Visit 10    PT Start Time 1015    PT Stop Time 1059    PT Time Calculation (min) 44 min    Equipment Utilized During Treatment Gait belt;Other (comment)   Hoyer lift pad; power wheelchair   Activity Tolerance Patient tolerated treatment well    Behavior During Therapy WFL for tasks assessed/performed;Anxious             Past Medical History:  Diagnosis Date   Diabetes mellitus (HCC)    Hypertension    Stroke [facility])     Past Surgical History:  Procedure Laterality Date   IVC FILTER PLACEMENT ([facility] HX)     LEG SURGERY     PEG TUBE PLACEMENT     TRACHEOSTOMY      There were no vitals filed for this visit.   Subjective Assessment - 01/27/21 1549     Subjective Patient reports he wasn't too sore from last visit but did endorse fatigue. .    Patient is accompained by: Family member   Mom- Paul Hall; Dad= Paul Hall   Pertinent History Patient is a 25 y.o. male with insulin dependent diabetes and morbid obesity (BMI 65) who was admitted with ARDS in the setting of Covid-19 On10/18/2020 History taken per admission note dated 01/20/2019. On 11/13, the patient was noticed to have RUE twitching. A noncontrast head CT was done and discovered an ischemic stroke and a subdural hemorrhage with slight midline shift. ICP management was initiated by keeping Na > 145 (on 259ml/hr normal saline) and head  of the bed > 90 degrees. Neurosurgery was consulted and does not recommend surgical decompression at this time. His heparin gtt that was being used to treat his bilateral DVTs was stopped and vascular surgery was consulted to place and IVC filter on 11/14. On 11/15 he was transferred back to the MICU after his CVAs did not worsen or evolve. He was not able to be weaned from the vent and underwent trach placement on 11/18 and G-tube placement on 11/19.    On 11/26 patient was noted to have "inward eye deviation" and "downward eye deviation with occasional twitch in the left eye" per nursing, concerning for seizure-like activity. He was restarted on cvEEG today and continued to Keppra 1 g BID. CT head wo contrast was obtained which showed new left frontal IPH with 1 cm midline shift and subfalcine herniation, and prior large left hemispheric infarct. . The patient's neurological exam has remained stable with brainstem reflexes intact, but functionally quadriplegic. with inward/downward eye deviation on 11/26 while on maintenance Keppra. There is no pathological intracranial focality in epilepsy that would make the patient's gaze present in such a manner. There was mild dysconjugate gaze with the right eye out, however, this was not fixed, there was no rhythmic, stereotyped movements to suggest seizures. cvEEG monitoring 11/27-11/28 revealed  no seizures only focal slowing consistent with his known vascular insult. There was a push button event for extremity shaking that had no ccvEEG correlate.  - Keppra 2 g BID.     Subacute ischemic stroke (left, ACA/MCA/PCA distribution)  Likely secondary to COVID hypercoagulable state. Carotid Dopplers negative. Stroke likely subacute given 3 serial CTs demonstrated no evolution of stroke size. Will get statin once CK/rhabdomyolysis and abnl LFTs resolve. 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    Limitations  Lifting;Standing;Walking;Writing;Reading;Sitting;House hold activities    How long can you sit comfortably? Patient reports uable to sit up long < 4 hours due to buttock discomfort    How long can you stand comfortably? Patient is currently unable to stand    How long can you walk comfortably? Patient is currently unable to walk    Diagnostic tests FINDINGS:  Brain: Left cerebral convexity cortical hypodensity with associated  cortical calcification extending from the frontal to the parietal  lobe. Extensive area of volume loss and low-density compatible with  chronic infarction. This is noted on prior reports from [facility].  Probable chronic watershed infarct. Additional small areas of  hypodensity in the right frontal white matter consistent with  chronic ischemia.     Negative for acute infarct. No acute hemorrhage or mass. Ventricle  size normal without midline shift.     Vascular: Normal arterial flow voids.     Skull: Negative     Sinuses/Orbits: Mucosal edema paranasal sinuses with air-fluid level  left sphenoid sinus. Negative orbit     Other: None     IMPRESSION:  Extensive watershed type infarct over the left cerebral convexity  with cortical calcification. This appears chronic. No associated  hemorrhage, hydrocephalus, or midline shift.     These results were called by telephone at the time of interpretation  on 05/09/2019 at 3:12 pm to provider Paul Hall , who verbally  acknowledged these results.        Electronically Signed    By: Paul Hall M.D.    On: 05/09/2019 15:13       Result History    CT HEAD WO CONTRAST (Order #[id number]) on 05/09/2019 - Order Result History Report    Patient Stated Goals I want to be able to possibly walk again    Currently in Pain? No/denies    Pain Onset More than a month ago            INTERVENTIONS:   *Dad present today along with SPT- Ramond Craver to assist with hoyer transfer   Initially Transferred patient from his power wheelchair to bariatric  treatment table (seated position) with 3 people- PT, SPT, and patient's fatherRaiford Hall. Patient was transported using his hoyer pad and no difficulty or reported pain. (Patient requested shoes off for session)   Positioned with pillow behind head on bariatric lift table with back inclined and foot rest angled 1/2 down.  - Initially started with LE activity then alternating with weight shifting activity.  1) Ankle pumps with min assist on left LE and mod A on right x 20 reps.  - Ant/Post weight shift (initially with PT assisting patient forward- he only required very minimal pull to attain erect seated posture without back support) - able to sit in this position without hands on knees for 2 min x 3 trials- Initial Min A- progressed to SBA.  -lateral weight shift 5x each side, PT on either side of patient, patient able  to transition weight but unable to clear bottom or bear weight through hands on mat table.  -gluteal squeezes 10x 3 second holds - LAQ 10x each LE, patient more challenged with RLE -hip adduction isometric into PT hands 10x 3 second holds - Static sit without back support and patient performed UE reaching to fist bump PT hand at chest height for patient- AAROM on right-yet patient able to track using primarily left eye and good ability to perform x 10 reps on left without LOB.  Education provided throughout session via VC/TC and demonstration to facilitate movement at target joints and correct muscle activation for all exercises performed.   Clinical Impression: Patient presented today in good spirits and motivated to work today. He was responsive to all verbal and tactile cues for all activities and participated well- improving with static sit without back or UE support. He demo improved confidence with trying new activities and required more assist moving to right or performing activities with right LE. Patient will continue to benefit from skilled physical therapy to improve his core  strength, as well as to improve his quality of life.                        PT Education - 01/27/21 1550     Education Details Exercise technique; Breathing technique with exericses.    Person(s) Educated Patient    Methods Explanation;Demonstration;Tactile cues;Verbal cues    Comprehension Verbalized understanding;Returned demonstration;Verbal cues required;Need further instruction              PT Short Term Goals - 01/04/21 1505       PT SHORT TERM GOAL #1   Title Pt will be independent with HEP in order to improve strength and balance in order to decrease fall risk and improve function at home and work.    Baseline 01/04/2021= No formal HEP in place.    Time 6    Period Weeks    Status New    Target Date 02/15/21               PT Long Term Goals - 01/19/21 0951       PT LONG TERM GOAL #1   Title Patient will increase BLE gross strength by 1/2 muscle grade to improve functional strength for improved independence with potential gait, increased standing tolerance and increased ADL ability.    Baseline 01/04/2021- Patient presents with 1/5 to 3-/5 B LE strength with MMT.    Time 12    Period Weeks    Status New    Target Date 03/29/21      PT LONG TERM GOAL #2   Title Patient will tolerate sitting unsupported demonstrating erect sitting posture for 15 minutes with CGA to demonstrate improved back extensor strength and improved sitting tolerance.    Baseline 01/04/2021- Patient confied to sitting in lift chair or electric power chair with back support and unable to sit upright without physical assistance.    Time 12    Period Weeks    Status New    Target Date 03/29/21      PT LONG TERM GOAL #3   Title Patient will demonstrate ability to perform static standing in // bars > 2 min with Max Assist  without loss of balance and fair posture for improved overall strength for pre-gait and transfer activities.    Baseline 01/04/2021= Patient current  uanble to stand- Dependent on hoyer or sit to stand lift for  transfers.    Time 12    Period Weeks    Status New    Target Date 03/29/21      PT LONG TERM GOAL #4   Title Pt will improve FOTO score by 10 points or more demonstrating improved perceived functional ability    Baseline FOTO 7 on 10/17    Time 12    Period Weeks    Status New    Target Date 03/29/21                   Plan - 01/27/21 1553     Clinical Impression Statement Patient presented today in good spirits and motivated to work today. He was responsive to all verbal and tactile cues for all activities and participated well- improving with static sit without back or UE support. He demo improved confidence with trying new activities and required more assist moving to right or performing activities with right LE. Patient will continue to benefit from skilled physical therapy to improve his core strength, as well as to improve his quality of life.    Personal Factors and Comorbidities Comorbidity 3+;Time since onset of injury/illness/exacerbation    Comorbidities CVA, diabetes, Seizures    Examination-Activity Limitations Bathing;Bed Mobility;Bend;Caring for Others;Carry;Dressing;Hygiene/Grooming;Lift;Locomotion Level;Reach Overhead;Self Feeding;Sit;Squat;Stairs;Stand;Transfers;Toileting    Examination-Participation Restrictions Cleaning;Community Activity;Driving;Laundry;Medication Management;Meal Prep;Occupation;Personal Finances;Shop;Yard Work;Volunteer    Stability/Clinical Decision Making Evolving/Moderate complexity    Rehab Potential Fair    PT Frequency 2x / week    PT Duration 12 weeks    PT Treatment/Interventions ADLs/Self Care Home Management;Cryotherapy;Electrical Stimulation;Moist Heat;Ultrasound;DME Instruction;Gait training;Stair training;Functional mobility training;Therapeutic exercise;Balance training;Patient/family education;Orthotic Fit/Training;Neuromuscular re-education;Wheelchair mobility  training;Manual techniques;Passive range of motion;Dry needling;Energy conservation;Taping;Visual/perceptual remediation/compensation;Joint Manipulations    PT Next Visit Plan Instruct in home program exercises; transfer assessment when appropriate; Assess standing    PT Home Exercise Plan Discussed performing quad sets, gluteal sets, bridgining as able- Discussed with patient and Dad    Consulted and Agree with Plan of Care Patient;Family member/caregiver    Family Member Consulted Mom- Paul Hall and Dad- Paul Hall             Patient will benefit from skilled therapeutic intervention in order to improve the following deficits and impairments:  Abnormal gait, Decreased activity tolerance, Decreased balance, Decreased cognition, Cardiopulmonary status limiting activity, Decreased coordination, Decreased endurance, Decreased knowledge of use of DME, Decreased mobility, Decreased range of motion, Decreased safety awareness, Decreased strength, Difficulty walking, Hypomobility, Impaired perceived functional ability, Impaired flexibility, Impaired UE functional use, Impaired vision/preception, Improper body mechanics, Postural dysfunction, Obesity  Visit Diagnosis: Abnormality of gait and mobility  Difficulty in walking, not elsewhere classified  Muscle weakness (generalized)  Unsteadiness on feet     Problem List Patient Active Problem List   Diagnosis Date Noted   Sepsis due to vancomycin resistant Enterococcus species (HCC) 06/06/2019   SIRS (systemic inflammatory response syndrome) (HCC) 06/05/2019   Acute lower UTI 06/05/2019   VRE (vancomycin-resistant Enterococci) infection 06/05/2019   Anemia 06/05/2019   Skin ulcer of sacrum with necrosis of muscle (HCC)    Urinary retention    Type 2 diabetes mellitus without complication, with long-term current use of insulin (HCC)    Tachycardia    Lower extremity edema    Acute metabolic encephalopathy    Obstructive sleep apnea    Morbid  obesity with BMI of 60.0-69.9, adult (HCC)    Goals of care, counseling/discussion    Palliative care encounter    Sepsis (HCC) 04/27/2019   H/O  insulin dependent diabetes mellitus 04/27/2019   History of CVA with residual deficit 04/27/2019   Seizure disorder (HCC) 04/27/2019   Decubitus ulcer of sacral region, stage 4 (HCC) 04/27/2019    Lenda Kelp, PT 01/27/2021, 4:35 PM  [facility] MAIN [facility] SERVICES [address] [place], [place], [address] Phone: [phone]   Fax:  [phone]  Name: Paul Hall MRN: [id number] Date of Birth: 05/12/1995

## 2021-01-27 NOTE — Therapy (Signed)
[facility] MAIN [facility] SERVICES [address] [place], [place], [address] Phone: [phone]   Fax:  [phone]  Occupational Therapy Treatment  Patient Details  Name: Paul Hall MRN: [id number] Date of Birth: [birth year] Referring Provider (OT): Lenise Herald   Encounter Date: 01/27/2021   OT End of Session - 01/27/21 1411     Visit Number 11    Number of Visits 24    Date for OT Re-Evaluation 03/24/21    Authorization Type Progress report period starting 12/15/2020    OT Start Time 1300    OT Stop Time 1345    OT Time Calculation (min) 45 min    Activity Tolerance Patient tolerated treatment well    Behavior During Therapy [facility] for tasks assessed/performed;Anxious             Past Medical History:  Diagnosis Date   Diabetes mellitus (HCC)    Hypertension    Stroke [facility])     Past Surgical History:  Procedure Laterality Date   IVC FILTER PLACEMENT ([facility] HX)     LEG SURGERY     PEG TUBE PLACEMENT     TRACHEOSTOMY      There were no vitals filed for this visit.   Subjective Assessment - 01/27/21 1410     Subjective  Pt reports enjoying the fall weather.    Patient is accompanied by: Family member    Pertinent History Pt. is a 25 y.o. male who was hospitalized, and diagnosed with COVID-19, sustained a CVA with resultant quadreplegia in 01/2019. Pt. was hospitalized with VRE  UTI. Pt. PMHx includes: urinary retention, Seizure Disorder, Obstructive Sleep Disorder, DM Type II, and Morbid obesity. Pt. resides at home with his parents.    Limitations BUE, and LE limitations with ROM, strength, motor control, and [facility] skills.    Currently in Pain? Yes    Pain Score 7     Pain Location Finger (Comment which one)   4th digit with passive extension   Pain Orientation Right    Pain Descriptors / Indicators Aching    Pain Type Chronic pain             OT TREATMENT     Estim attended:   Pt. tolerated neuromuscular  estim  for 8 min., cycle time 10/10,  duty cycle 50%. Applied to the right wrist, and digit extensors, followed by  8 min. Applied to the bilateral wrist, and digit extensors with the intensity set to 3, and constant monitoring 100% of the time. Manual assist was required for wrist and digit extension with each rep. To hold the position.    Therapeutic Exercise:   Patient tolerated active range of motion reps for bilateral shoulder flexion abduction. Passive range of motion for bilateral elbow flexion and extension with slow prolonged gentle stretching for elbow extension, wrist extension, and digit extension. Patient tolerated moist heat modality to his bilateral hands prior to range of motion.   Pt. reports that they were contacted from Dr. Margaretmary Eddy office about Botox. Pt. has improved with ROM for the right shoulder flexion, and abduction. Pt. Is tolerating Estim well to the right, and left wrist, and digit extensors with manual assist. Pt. continues to require assist with self-feeding, and self-grooming tasks. Patient continues to work on normalizing tone in the bilateral upper extremities in order to improve range of motion, and facilitate active range of motion in order to increase engagement in ADLs and IADL tasks.  OT Education - 01/27/21 1411     Education Details Pt./caregiver education was provided about ROM.    Person(s) Educated Patient    Methods Explanation    Comprehension Verbalized understanding;Verbal cues required;Need further instruction                 OT Long Term Goals - 01/20/21 1311       OT LONG TERM GOAL #1   Title Pt. will improve FOTO score by 2 points for clinically significant improvements during ADLs, and IADLs,    Baseline 10th Visit: FOTO score 42 Eval: FOTO score: 41    Time 12    Period Weeks    Status On-going    Target Date 03/24/21      OT LONG TERM GOAL #2   Title Pt. will improve right shoulder  flexion by 10 degrees to assist caregivers with UE dressing    Baseline 10th visit: ROM  R 65 scaption(95) Attempts for Active flexion result in scaption. Eval: R: 62(94), L: 114(120    Time 12    Period Weeks    Status On-going    Target Date 03/24/21      OT LONG TERM GOAL #3   Title Pt. will improve right Abduction by 10 degrees to assist caregivers with underarm hygiene care.    Baseline 10th visit: 86(95) Eval: R: 76(93)    Time 12    Period Weeks    Status On-going    Target Date 03/24/21      OT LONG TERM GOAL #4   Title Pt. will demonstrate visual compensatory strategies for navigating through his environment.    Baseline 10th visit: continues to present with limited vision. Eval: Limited    Time 12    Period Weeks    Status On-going    Target Date 03/24/21      OT LONG TERM GOAL #5   Title Pt. will demonstrate visual compensatory strategies for tabletop tasks within his near space.    Baseline 10th visit: Pt. continues to present with limited vision. Eval: Pt. has difficulty    Time 12    Period Weeks    Target Date 03/24/21      OT LONG TERM GOAL #6   Title Pt. will perform hand to mouth patterns with min A in preparation for self-feeding.    Baseline 10th visit: Pt. is able to perfrom hand to mouth patterns, however reports weakness in his hand  limits a secure grasp on the utensi Pt. requires assist to perform. Eval: Pt. has difficulty    Time 12    Period Weeks    Status On-going    Target Date 03/24/21      OT LONG TERM GOAL #7   Title Pt. will perform one step self-grooming tasks with minA.    Baseline 10th visit: Pt. has difficulty sustaining the UEs in elevation for the duration required to perfrom self-grooming tasks thoroughly.Eval: pt. has difficulty    Time 12    Period Weeks    Status On-going    Target Date 03/24/21                   Plan - 01/27/21 1411     Clinical Impression Statement Pt. Reports that they were contacted from Dr.  Margaretmary Eddy office about Botox. Pt. has improved with ROM for the right shoulder flexion, and abduction. Pt. Is tolerating Estim well to the right, and left wrist, and digit extensors with manual  assist. Pt. continues to require assist with self-feeding, and self-grooming tasks. Patient continues to work on normalizing tone in the bilateral upper extremities in order to improve range of motion, and facilitate active range of motion in order to increase engagement in ADLs and IADL tasks.   OT Occupational Profile and History Detailed Assessment- Review of Records and additional review of physical, cognitive, psychosocial history related to current functional performance    Occupational performance deficits (Please refer to evaluation for details): ADL's;IADL's    Body Structure / Function / Physical Skills ADL;FMC;ROM;Dexterity;IADL;Endurance;Obesity;Strength;UE functional use    Rehab Potential Good    Clinical Decision Making Multiple treatment options, significant modification of task necessary    Comorbidities Affecting Occupational Performance: Presence of comorbidities impacting occupational performance    Modification or Assistance to Complete Evaluation  Max significant modification of tasks or assist is necessary to complete    OT Frequency 2x / week    OT Duration 12 weeks    OT Treatment/Interventions Self-care/ADL training;Therapeutic exercise;Neuromuscular education;Visual/perceptual remediation/compensation;Patient/family education;Therapeutic activities;DME and/or AE instruction;Passive range of motion;Moist Heat;Electrical Stimulation    Consulted and Agree with Plan of Care Patient             Patient will benefit from skilled therapeutic intervention in order to improve the following deficits and impairments:   Body Structure / Function / Physical Skills: ADL, FMC, ROM, Dexterity, IADL, Endurance, Obesity, Strength, UE functional use       Visit Diagnosis: Muscle weakness  (generalized)    Problem List Patient Active Problem List   Diagnosis Date Noted   Sepsis due to vancomycin resistant Enterococcus species (HCC) 06/06/2019   SIRS (systemic inflammatory response syndrome) (HCC) 06/05/2019   Acute lower UTI 06/05/2019   VRE (vancomycin-resistant Enterococci) infection 06/05/2019   Anemia 06/05/2019   Skin ulcer of sacrum with necrosis of muscle (HCC)    Urinary retention    Type 2 diabetes mellitus without complication, with long-term current use of insulin (HCC)    Tachycardia    Lower extremity edema    Acute metabolic encephalopathy    Obstructive sleep apnea    Morbid obesity with BMI of 60.0-69.9, adult (HCC)    Goals of care, counseling/discussion    Palliative care encounter    Sepsis (HCC) 04/27/2019   H/O insulin dependent diabetes mellitus 04/27/2019   History of CVA with residual deficit 04/27/2019   Seizure disorder (HCC) 04/27/2019   Decubitus ulcer of sacral region, stage 4 (HCC) 04/27/2019    Olegario Messier, MS, OTR/L 01/27/2021, 2:16 PM  [place] MAIN [facility] SERVICES [address] [place], [place], [address] Phone: [phone]   Fax:  [phone]  Name: Paul Hall MRN: [id number] Date of Birth: [birth year]

## 2021-01-27 NOTE — Patient Instructions (Signed)
Reviewed memory strategies for use at home: 1) quiet repetition, 2) mental visualization

## 2021-01-28 ENCOUNTER — Encounter: Payer: BC Managed Care – PPO | Admitting: Occupational Therapy

## 2021-01-28 ENCOUNTER — Ambulatory Visit: Payer: BC Managed Care – PPO

## 2021-01-28 ENCOUNTER — Encounter: Payer: BC Managed Care – PPO | Admitting: Speech Pathology

## 2021-01-29 DIAGNOSIS — I6389 Other cerebral infarction: Secondary | ICD-10-CM | POA: Diagnosis not present

## 2021-01-29 DIAGNOSIS — G4733 Obstructive sleep apnea (adult) (pediatric): Secondary | ICD-10-CM | POA: Diagnosis not present

## 2021-01-29 DIAGNOSIS — L8994 Pressure ulcer of unspecified site, stage 4: Secondary | ICD-10-CM | POA: Diagnosis not present

## 2021-02-01 ENCOUNTER — Ambulatory Visit: Payer: BC Managed Care – PPO | Admitting: Occupational Therapy

## 2021-02-01 ENCOUNTER — Ambulatory Visit: Payer: BC Managed Care – PPO | Admitting: Speech Pathology

## 2021-02-01 ENCOUNTER — Other Ambulatory Visit: Payer: Self-pay

## 2021-02-01 ENCOUNTER — Encounter: Payer: Self-pay | Admitting: Occupational Therapy

## 2021-02-01 DIAGNOSIS — R41841 Cognitive communication deficit: Secondary | ICD-10-CM

## 2021-02-01 DIAGNOSIS — R278 Other lack of coordination: Secondary | ICD-10-CM | POA: Diagnosis not present

## 2021-02-01 DIAGNOSIS — M6281 Muscle weakness (generalized): Secondary | ICD-10-CM

## 2021-02-01 DIAGNOSIS — R2681 Unsteadiness on feet: Secondary | ICD-10-CM | POA: Diagnosis not present

## 2021-02-01 DIAGNOSIS — I6930 Unspecified sequelae of cerebral infarction: Secondary | ICD-10-CM | POA: Diagnosis not present

## 2021-02-01 DIAGNOSIS — R471 Dysarthria and anarthria: Secondary | ICD-10-CM

## 2021-02-01 DIAGNOSIS — R262 Difficulty in walking, not elsewhere classified: Secondary | ICD-10-CM | POA: Diagnosis not present

## 2021-02-01 DIAGNOSIS — R269 Unspecified abnormalities of gait and mobility: Secondary | ICD-10-CM | POA: Diagnosis not present

## 2021-02-01 NOTE — Therapy (Signed)
[place] MAIN [facility] SERVICES [address] [place], [place], [address] Phone: [phone]   Fax:  [phone]  Speech Language Pathology Treatment  Patient Details  Name: Paul Hall MRN: [id number] Date of Birth: 1996-03-21 Referring Provider (SLP): Collene Mares   Encounter Date: 02/01/2021   End of Session - 02/01/21 1628     Visit Number 12    Number of Visits 25    Date for SLP Re-Evaluation 03/09/21    Authorization Type [employer] [place] [employer] PPO    Authorization Time Period 12/15/2020 thru 16/09/2020    Authorization - Visit Number 12    Authorization - Number of Visits 12    Progress Note Due on Visit 10    SLP Start Time 1100    SLP Stop Time  1200    SLP Time Calculation (min) 60 min    Activity Tolerance Patient tolerated treatment well             Past Medical History:  Diagnosis Date   Diabetes mellitus ([place])    Hypertension    Stroke [facility])     Past Surgical History:  Procedure Laterality Date   IVC FILTER PLACEMENT ([facility] HX)     LEG SURGERY     PEG TUBE PLACEMENT     TRACHEOSTOMY      There were no vitals filed for this visit.   Subjective Assessment - 02/01/21 1619     Subjective Patient pleasantly motivated for therapy. Father present for session    Patient is accompained by: Family member    Currently in Pain? No/denies                   ADULT SLP TREATMENT - 02/01/21 0001       General Information   Behavior/Cognition Alert;Cooperative;Pleasant mood      Treatment Provided   Treatment provided Cognitive-Linquistic      Cognitive-Linquistic Treatment   Treatment focused on Cognition    Skilled Treatment COGNITIVE COMMUNICATION: Patient demonstrated memory strategy: subject/topic identification with 25% accuracy x10 IND improved with minimal context cues to 100% acc. Patient practiced  visualizaiton strategy x10 with mod assist for increased detail description of  imagery for increased accuracy in recall to be completed later in session. Follow up recall of various non-related information from visualizaiton exercise completed with 80% accuracy x10. Given new information with x3 elements per x2 topics patient carried over use of visualization memory strategy IND with +recall for 100% accuracy of x6/6 elements. Patient respones throughout session were audible and 100% intelligible utilizing strategies for clear speech.              SLP Education - 02/01/21 1627     Education Details memory strategies; progress    Person(s) Educated Patient;Parent(s)    Methods Explanation;Demonstration    Comprehension Verbalized understanding;Need further instruction              SLP Short Term Goals - 01/27/21 1354       SLP SHORT TERM GOAL #1   Title Using a good quality loud voice, pt will recall 2 of 3 memory strategies given minimal verbal cues.    Baseline goal met, revised to reflect progress    Time 10    Status On-going      SLP SHORT TERM GOAL #2   Title Pt will recall 5 or more items (medication list) after a 15 minute delay given moderate cues in order to  increase independent during functional memory tasks.    Baseline goal revised to address functional deficits    Time 10    Status On-going      SLP SHORT TERM GOAL #3   Title After listening to functional information, pt will recall 2 pieces of functional information with 95% accuracy given minimal assistance.    Time 10      SLP SHORT TERM GOAL #4   Title With minimal cues, pt will perform basic mental manipulation activities with 75% accuracy.    Status On-going              SLP Long Term Goals - 01/21/21 1404       SLP LONG TERM GOAL #1   Title Using a good quality loud voice, pt will record a journal of daily activities to aid in recall of information.    Baseline goal met, revised to reflect progress    Time 12    Period Weeks    Status Revised    Target Date 03/09/21       SLP LONG TERM GOAL #2   Title Pt will comprehend his list of medications in order to recall 6 medications with minimal assistance.    Status On-going    Target Date 03/09/21      SLP LONG TERM GOAL #3   Title Pt will verbally generate solutions to problems encountered in everyday living tasks with minimal assistance.    Baseline goal met, revised to reflect pt progress    Time 12    Period Weeks    Status Revised    Target Date 03/09/21              Plan - 02/01/21 1629     Clinical Impression Statement Pt continues to present with moderate cognitive communication deficits that are further complicated by deficits in vision and bilateral upper extremity weakness. Patient is challenged by complex questions such as exclusion of information though benefits from breakdown of information prior to such challenges. Session targeted continued memory strategy training for recall of increased complexity information. Pt is currently using quiet verbal repetition, subject/topic identificaiton, and visualization as a compensatory memory strategy. Skilled ST intervention continues to be required to target his moderate cognitive communication impairments in an effort to increase his functional independence and reduce caregiver burden.    Speech Therapy Frequency 2x / week    Duration 12 weeks    Treatment/Interventions Language facilitation;Cueing hierarchy;Internal/external aids;Functional tasks;SLP instruction and feedback;Patient/family education;Cognitive reorganization;Compensatory strategies    Potential to Achieve Goals Fair    Potential Considerations Severity of impairments;Co-morbidities    SLP Home Exercise Plan provided, see pt instructions section    Consulted and Agree with Plan of Care Patient;Family member/caregiver    Family Member Consulted pt's dad             Patient will benefit from skilled therapeutic intervention in order to improve the following deficits and  impairments:   Dysarthria and anarthria  History of CVA with residual deficit  Cognitive communication deficit    Problem List Patient Active Problem List   Diagnosis Date Noted   Sepsis due to vancomycin resistant Enterococcus species ([place]) 06/06/2019   SIRS (systemic inflammatory response syndrome) ([place]) 06/05/2019   Acute lower UTI 06/05/2019   VRE (vancomycin-resistant Enterococci) infection 06/05/2019   Anemia 06/05/2019   Skin ulcer of sacrum with necrosis of muscle ([place])    Urinary retention    Type 2 diabetes mellitus without complication,  with long-term current use of insulin ([facility])    Tachycardia    Lower extremity edema    Acute metabolic encephalopathy    Obstructive sleep apnea    Morbid obesity with BMI of 60.0-69.9, adult ([place])    Goals of care, counseling/discussion    Palliative care encounter    Sepsis ([place]) 04/27/2019   H/O insulin dependent diabetes mellitus 04/27/2019   History of CVA with residual deficit 04/27/2019   Seizure disorder ([place]) 04/27/2019   Decubitus ulcer of sacral region, stage 4 ([place]) 04/27/2019   [place] L. Madden Garron, M.A. [place] [phone]  Dalbert Batman 02/01/2021, 4:37 PM  [place] MAIN [facility] SERVICES [address] [place], [place], [address] Phone: [phone]   Fax:  [phone]   Name: Paul Hall MRN: [id number] Date of Birth: July 31, 1995

## 2021-02-01 NOTE — Patient Instructions (Signed)
Continued use of memory strategies trained in session; visualization and topic/subject identfication and previously trained quiet repetition.

## 2021-02-01 NOTE — Therapy (Signed)
[place] [facility] MAIN [facility] SERVICES [address] [place], [place], [address] Phone: [phone]   Fax:  [phone]  Occupational Therapy Treatment  Patient Details  Name: Paul Hall MRN: [id number] Date of Birth: 03-14-1996 Referring Provider (OT): Lenise Herald   Encounter Date: 02/01/2021   OT End of Session - 02/01/21 1453     Visit Number 12    Number of Visits 24    Date for OT Re-Evaluation 03/24/21    Authorization Type Progress report period starting 12/15/2020    OT Start Time 1300    OT Stop Time 1345    OT Time Calculation (min) 45 min    Activity Tolerance Patient tolerated treatment well    Behavior During Therapy [facility] for tasks assessed/performed;Anxious             Past Medical History:  Diagnosis Date   Diabetes mellitus ([facility])    Hypertension    Stroke [facility])     Past Surgical History:  Procedure Laterality Date   IVC FILTER PLACEMENT ([facility] HX)     LEG SURGERY     PEG TUBE PLACEMENT     TRACHEOSTOMY      There were no vitals filed for this visit.   Subjective Assessment - 02/01/21 1451     Subjective  Pt reports left hand soreness when waking up this morning.    Patient is accompanied by: Family member    Pertinent History Pt. is a 25 y.o. male who was hospitalized, and diagnosed with COVID-19, sustained a CVA with resultant quadreplegia in 01/2019. Pt. was hospitalized with VRE  UTI. Pt. PMHx includes: urinary retention, Seizure Disorder, Obstructive Sleep Disorder, DM Type II, and Morbid obesity. Pt. resides at home with his parents.    Currently in Pain? Yes    Pain Score 9     Pain Location Hand    Pain Orientation Left    Pain Descriptors / Indicators Aching    Pain Type Chronic pain    Aggravating Factors  Intermittent            OT TREATMENT     Estim attended:   Pt. tolerated neuromuscular estim  for 8 min., cycle time 10/10,  duty cycle 50%. Applied to the right wrist, and digit  extensors, followed by  8 min.  With the intensity at 38.    Therapeutic Exercise:   Patient tolerated active range of motion reps for bilateral shoulder flexion abduction. Passive range of motion for bilateral digit MP, PIP, DIP extension. Patient tolerated moist heat modality to his bilateral hands prior to range of motion.  Manual Therapy:  Pt. tolerated soft tissue massage to the left hand, and digits independent of, and in preparation for ROM, and there. Ex.   Pt. reports having had 9/10 pain in his left hand, and fingers when he woke up this morning.  Pt. reports that he occasionally has pain in his left hand when waking up in the morning. Pt. reports having 9/10 pain in his left hand with gentle ROM.  Pt. reports that they were unable to perform his ROM this morning. Pt. presents with flexor tone, and tightness in bilateral PIPs, and DIPs with hyperextension at the MPs. Pt. Education was provided about ROM, positioning, and possible splinting for the left hand, at night. Pt. presents with flexor tone, and tightness in the bilateral elbows, forearms, wrist, and digits which limit functional reaching, and active engagement of the BUEs during ADLs, and  IADLs. Pt. continues to present with active wrist, and MP extension, however presents with MP hyperextension with PIP/DIP flexion when attempting active digit MP extension. Pt. tolerated Estim to the right wrist, and digit extensors. Patient continues to work on normalizing tone in the bilateral upper extremities in order to improve range of motion, and facilitate active range of motion in order to increase engagement in ADLs and IADL tasks.                            OT Education - 02/01/21 1453     Education Details Pt./caregiver education was provided about ROM.    Person(s) Educated Patient    Methods Explanation    Comprehension Verbalized understanding;Verbal cues required;Need further instruction                  OT Long Term Goals - 01/20/21 1311       OT LONG TERM GOAL #1   Title Pt. will improve FOTO score by 2 points for clinically significant improvements during ADLs, and IADLs,    Baseline 10th Visit: FOTO score 42 Eval: FOTO score: 41    Time 12    Period Weeks    Status On-going    Target Date 03/24/21      OT LONG TERM GOAL #2   Title Pt. will improve right shoulder flexion by 10 degrees to assist caregivers with UE dressing    Baseline 10th visit: ROM  R 65 scaption(95) Attempts for Active flexion result in scaption. Eval: R: 62(94), L: 114(120    Time 12    Period Weeks    Status On-going    Target Date 03/24/21      OT LONG TERM GOAL #3   Title Pt. will improve right Abduction by 10 degrees to assist caregivers with underarm hygiene care.    Baseline 10th visit: 86(95) Eval: R: 76(93)    Time 12    Period Weeks    Status On-going    Target Date 03/24/21      OT LONG TERM GOAL #4   Title Pt. will demonstrate visual compensatory strategies for navigating through his environment.    Baseline 10th visit: continues to present with limited vision. Eval: Limited    Time 12    Period Weeks    Status On-going    Target Date 03/24/21      OT LONG TERM GOAL #5   Title Pt. will demonstrate visual compensatory strategies for tabletop tasks within his near space.    Baseline 10th visit: Pt. continues to present with limited vision. Eval: Pt. has difficulty    Time 12    Period Weeks    Target Date 03/24/21      OT LONG TERM GOAL #6   Title Pt. will perform hand to mouth patterns with min A in preparation for self-feeding.    Baseline 10th visit: Pt. is able to perfrom hand to mouth patterns, however reports weakness in his hand  limits a secure grasp on the utensi Pt. requires assist to perform. Eval: Pt. has difficulty    Time 12    Period Weeks    Status On-going    Target Date 03/24/21      OT LONG TERM GOAL #7   Title Pt. will perform one step self-grooming  tasks with minA.    Baseline 10th visit: Pt. has difficulty sustaining the UEs in elevation for the duration required to  perfrom self-grooming tasks thoroughly.Eval: pt. has difficulty    Time 12    Period Weeks    Status On-going    Target Date 03/24/21                   Plan - 02/01/21 1453     Clinical Impression Statement Pt. reports having had 9/10 pain in his left hand, and fingers when he woke up this morning.  Pt. reports reports that he occasionally has pain in his left hand when waking up in the morning. Pt. reports having 9/10 pain in his left hand with gentle ROM.  Pt. reports that they were unable to perform his ROM this morning. Pt. presents with flexor tone, and tightness in bilateral PIPs, and DIPs with hyperextension at the MPs. Pt. Education was provided about ROM, positioning, and possible splinting for the left hand, at night. Pt. presents with flexor tone, and tightness in the bilateral elbows, forearms, wrist, and digits which limit functional reaching, and active engagement of the BUEs during ADLs, and IADLs. Pt. continues to present with active wrist, and MP extension, however presents with MP hyperextension with PIP/DIP flexion when attempting active digit MP extension. Pt. tolerated Estim to the right wrist, and digit extensors. Patient continues to work on normalizing tone in the bilateral upper extremities in order to improve range of motion, and facilitate active range of motion in order to increase engagement in ADLs and IADL tasks.       OT Occupational Profile and History Detailed Assessment- Review of Records and additional review of physical, cognitive, psychosocial history related to current functional performance    Occupational performance deficits (Please refer to evaluation for details): ADL's;IADL's    Body Structure / Function / Physical Skills ADL;FMC;ROM;Dexterity;IADL;Endurance;Obesity;Strength;UE functional use    Rehab Potential Good     Clinical Decision Making Multiple treatment options, significant modification of task necessary    Comorbidities Affecting Occupational Performance: Presence of comorbidities impacting occupational performance    Modification or Assistance to Complete Evaluation  Max significant modification of tasks or assist is necessary to complete    OT Frequency 2x / week    OT Duration 12 weeks    OT Treatment/Interventions Self-care/ADL training;Therapeutic exercise;Neuromuscular education;Visual/perceptual remediation/compensation;Patient/family education;Therapeutic activities;DME and/or AE instruction;Passive range of motion;Moist Heat;Electrical Stimulation    Consulted and Agree with Plan of Care Patient             Patient will benefit from skilled therapeutic intervention in order to improve the following deficits and impairments:   Body Structure / Function / Physical Skills: ADL, FMC, ROM, Dexterity, IADL, Endurance, Obesity, Strength, UE functional use       Visit Diagnosis: Muscle weakness (generalized)  Other lack of coordination    Problem List Patient Active Problem List   Diagnosis Date Noted   Sepsis due to vancomycin resistant Enterococcus species (H[facility]) 06/06/2019   SIRS (systemic inflammatory response syndrome) ([facility]) 06/05/2019   Acute lower UTI 06/05/2019   VRE (vancomycin-resistant Enterococci) infection 06/05/2019   Anemia 06/05/2019   Skin ulcer of sacrum with necrosis of muscle (HCC)    Urinary retention    Type 2 diabetes mellitus without complication, with long-term current use of insulin (H[facility])    Tachycardia    Lower extremity edema    Acute metabolic encephalopathy    Obstructive sleep apnea    Morbid obesity with BMI of 60.0-69.9, adult ([facility]CC)    Goals of care, counseling/discussion    Palliative care encounter  Sepsis ([facility]) 04/27/2019   H/O insulin dependent diabetes mellitus 04/27/2019   History of CVA with residual deficit 04/27/2019   Seizure  disorder (HCC) 04/27/2019   Decubitus ulcer of sacral region, stage 4 (HCC) 04/27/2019    Olegario Messier, MS, OTR/L 02/01/2021, 3:02 PM  [place] [facility] MAIN [facility] SERVICES [address] [place], [place], [address] Phone: [phone]   Fax:  [phone]  Name: Paul Hall MRN: [id number] Date of Birth: [birth year]

## 2021-02-03 ENCOUNTER — Other Ambulatory Visit: Payer: Self-pay

## 2021-02-03 ENCOUNTER — Ambulatory Visit: Payer: BC Managed Care – PPO

## 2021-02-03 ENCOUNTER — Encounter: Payer: Self-pay | Admitting: Occupational Therapy

## 2021-02-03 ENCOUNTER — Ambulatory Visit: Payer: BC Managed Care – PPO | Attending: Physician Assistant | Admitting: Occupational Therapy

## 2021-02-03 ENCOUNTER — Ambulatory Visit: Payer: BC Managed Care – PPO | Admitting: Speech Pathology

## 2021-02-03 DIAGNOSIS — R41841 Cognitive communication deficit: Secondary | ICD-10-CM | POA: Insufficient documentation

## 2021-02-03 DIAGNOSIS — R2681 Unsteadiness on feet: Secondary | ICD-10-CM | POA: Diagnosis not present

## 2021-02-03 DIAGNOSIS — R269 Unspecified abnormalities of gait and mobility: Secondary | ICD-10-CM

## 2021-02-03 DIAGNOSIS — R262 Difficulty in walking, not elsewhere classified: Secondary | ICD-10-CM | POA: Diagnosis not present

## 2021-02-03 DIAGNOSIS — R2689 Other abnormalities of gait and mobility: Secondary | ICD-10-CM | POA: Diagnosis not present

## 2021-02-03 DIAGNOSIS — M6281 Muscle weakness (generalized): Secondary | ICD-10-CM

## 2021-02-03 DIAGNOSIS — I6930 Unspecified sequelae of cerebral infarction: Secondary | ICD-10-CM

## 2021-02-03 DIAGNOSIS — R278 Other lack of coordination: Secondary | ICD-10-CM | POA: Insufficient documentation

## 2021-02-03 NOTE — Therapy (Signed)
[facility] MAIN [facility] SERVICES [address] [place], [place], [address] Phone: [phone]   Fax:  [phone]  Physical Therapy Treatment  Patient Details  Name: Paul Hall MRN: [id number] Date of Birth: 15-Jul-1995 Referring Provider (PT): Lenise Herald, PA-C   Encounter Date: 02/03/2021   PT End of Session - 02/03/21 1450     Visit Number 5    Number of Visits 25    Date for PT Re-Evaluation 03/29/21    Authorization Type [employer] and [employer]- Need auth past 12th visit    Authorization - Visit Number 5    Authorization - Number of Visits 12    Progress Note Due on Visit 10    PT Start Time 1515    PT Stop Time 1553    PT Time Calculation (min) 38 min    Equipment Utilized During Treatment Gait belt;Other (comment)   Hoyer lift pad; power wheelchair   Activity Tolerance Patient tolerated treatment well    Behavior During Therapy WFL for tasks assessed/performed;Anxious             Past Medical History:  Diagnosis Date   Diabetes mellitus (HCC)    Hypertension    Stroke [facility])     Past Surgical History:  Procedure Laterality Date   IVC FILTER PLACEMENT ([facility] HX)     LEG SURGERY     PEG TUBE PLACEMENT     TRACHEOSTOMY      There were no vitals filed for this visit.   Subjective Assessment - 02/03/21 1713     Subjective Patient presents with father. Has been compliant with HEP.    Patient is accompained by: Family member   [name]; [name]   Pertinent History Patient is a 25 y.o. male with insulin dependent diabetes and morbid obesity (BMI 65) who was admitted with ARDS in the setting of Covid-19 On10/18/2020 History taken per admission note dated 01/20/2019. On 11/13, the patient was noticed to have RUE twitching. A noncontrast head CT was done and discovered an ischemic stroke and a subdural hemorrhage with slight midline shift. ICP management was initiated by keeping Na > 145 (on 2104ml/hr normal  saline) and head of the bed > 90 degrees. Neurosurgery was consulted and does not recommend surgical decompression at this time. His heparin gtt that was being used to treat his bilateral DVTs was stopped and vascular surgery was consulted to place and IVC filter on 11/14. On 11/15 he was transferred back to the MICU after his CVAs did not worsen or evolve. He was not able to be weaned from the vent and underwent trach placement on 11/18 and G-tube placement on 11/19.    On 11/26 patient was noted to have "inward eye deviation" and "downward eye deviation with occasional twitch in the left eye" per nursing, concerning for seizure-like activity. He was restarted on cvEEG today and continued to Keppra 1 g BID. CT head wo contrast was obtained which showed new left frontal IPH with 1 cm midline shift and subfalcine herniation, and prior large left hemispheric infarct. . The patient's neurological exam has remained stable with brainstem reflexes intact, but functionally quadriplegic. with inward/downward eye deviation on 11/26 while on maintenance Keppra. There is no pathological intracranial focality in epilepsy that would make the patient's gaze present in such a manner. There was mild dysconjugate gaze with the right eye out, however, this was not fixed, there was no rhythmic, stereotyped movements to suggest seizures. cvEEG  monitoring 11/27-11/28 revealed no seizures only focal slowing consistent with his known vascular insult. There was a push button event for extremity shaking that had no ccvEEG correlate.  - Keppra 2 g BID.     Subacute ischemic stroke (left, ACA/MCA/PCA distribution)  Likely secondary to COVID hypercoagulable state. Carotid Dopplers negative. Stroke likely subacute given 3 serial CTs demonstrated no evolution of stroke size. Will get statin once CK/rhabdomyolysis and abnl LFTs resolve. 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    Limitations  Lifting;Standing;Walking;Writing;Reading;Sitting;House hold activities    How long can you sit comfortably? Patient reports uable to sit up long < 4 hours due to buttock discomfort    How long can you stand comfortably? Patient is currently unable to stand    How long can you walk comfortably? Patient is currently unable to walk    Diagnostic tests FINDINGS:  Brain: Left cerebral convexity cortical hypodensity with associated  cortical calcification extending from the frontal to the parietal  lobe. Extensive area of volume loss and low-density compatible with  chronic infarction. This is noted on prior reports from [facility].  Probable chronic watershed infarct. Additional small areas of  hypodensity in the right frontal white matter consistent with  chronic ischemia.     Negative for acute infarct. No acute hemorrhage or mass. Ventricle  size normal without midline shift.     Vascular: Normal arterial flow voids.     Skull: Negative     Sinuses/Orbits: Mucosal edema paranasal sinuses with air-fluid level  left sphenoid sinus. Negative orbit     Other: None     IMPRESSION:  Extensive watershed type infarct over the left cerebral convexity  with cortical calcification. This appears chronic. No associated  hemorrhage, hydrocephalus, or midline shift.     These results were called by telephone at the time of interpretation  on 05/09/2019 at 3:12 pm to provider Alford Highland , who verbally  acknowledged these results.        Electronically Signed    By: Marlan Palau M.D.    On: 05/09/2019 15:13       Result History    CT HEAD WO CONTRAST (Order #[id number]) on 05/09/2019 - Order Result History Report    Patient Stated Goals I want to be able to possibly walk again    Currently in Pain? Yes    Pain Score 1     Pain Location Hand    Pain Orientation Right;Left    Pain Descriptors / Indicators Aching    Pain Type Chronic pain    Pain Onset More than a month ago                   Transfer from Power  wheelchair to treatment table utilizing hoyer lift and 3 x assist with 2 staff and a SPT.      Seated with pillow behind head on bariatric lift table with back inclined and foot rest down.  -weight shift forward/backwards 5x with 30 second holds with decreasing assistance from min A to CGA -lateral weight shift 8x each side, PT on either side of patient, patient able to transition weight with increased shift with PT's increasing distance from patient each repetition  -holding small ball in both hands (mod A for holding ball); forward chest press and back with BUE support 6x -cross body punches modified with upright posture without support x3 minutes ; very challenging for RUE; requires min A for movement -hip  flexion march with modified LAQ 10x each LE, patient more challenged with RLE -hip adduction isometric into PT hands 10x 3 second holds -ankle AAROM with PT assistance 30 second holds into df -hip adduction and IR hold 30 seconds patient reports moderate to large stretch each LE -LE press into PT hand 10x each LE   Shoes doffed/donned for session.    Pt educated throughout session about proper posture and technique with exercises. Improved exercise technique, movement at target joints, use of target muscles after min to mod verbal, visual, tactile cues.     Patient demonstrates excellent trunk stability progression this session with decreased need for assistance, increased ability to maintain seated position without back support, and increased pertubation/reaching to limits of stability. Patient's LE strength continues to be area for improvement with patient tolerating interventions well. His self confidence is improving each session. Patient will continue to benefit from skilled physical therapy to improve his core strength, as well as to improve his quality of life                  PT Education - 02/03/21 1449     Education Details exercise technique, body mechanics     Person(s) Educated Patient    Methods Explanation;Demonstration;Tactile cues;Verbal cues    Comprehension Verbalized understanding;Returned demonstration;Verbal cues required;Tactile cues required              PT Short Term Goals - 01/04/21 1505       PT SHORT TERM GOAL #1   Title Pt will be independent with HEP in order to improve strength and balance in order to decrease fall risk and improve function at home and work.    Baseline 01/04/2021= No formal HEP in place.    Time 6    Period Weeks    Status New    Target Date 02/15/21               PT Long Term Goals - 01/19/21 0951       PT LONG TERM GOAL #1   Title Patient will increase BLE gross strength by 1/2 muscle grade to improve functional strength for improved independence with potential gait, increased standing tolerance and increased ADL ability.    Baseline 01/04/2021- Patient presents with 1/5 to 3-/5 B LE strength with MMT.    Time 12    Period Weeks    Status New    Target Date 03/29/21      PT LONG TERM GOAL #2   Title Patient will tolerate sitting unsupported demonstrating erect sitting posture for 15 minutes with CGA to demonstrate improved back extensor strength and improved sitting tolerance.    Baseline 01/04/2021- Patient confied to sitting in lift chair or electric power chair with back support and unable to sit upright without physical assistance.    Time 12    Period Weeks    Status New    Target Date 03/29/21      PT LONG TERM GOAL #3   Title Patient will demonstrate ability to perform static standing in // bars > 2 min with Max Assist  without loss of balance and fair posture for improved overall strength for pre-gait and transfer activities.    Baseline 01/04/2021= Patient current uanble to stand- Dependent on hoyer or sit to stand lift for transfers.    Time 12    Period Weeks    Status New    Target Date 03/29/21      PT LONG TERM  GOAL #4   Title Pt will improve FOTO score by 10 points or  more demonstrating improved perceived functional ability    Baseline FOTO 7 on 10/17    Time 12    Period Weeks    Status New    Target Date 03/29/21                   Plan - 02/03/21 1715     Clinical Impression Statement Patient demonstrates excellent trunk stability progression this session with decreased need for assistance, increased ability to maintain seated position without back support, and increased pertubation/reaching to limits of stability. Patient's LE strength continues to be area for improvement with patient tolerating interventions well. His self confidence is improving each session. Patient will continue to benefit from skilled physical therapy to improve his core strength, as well as to improve his quality of life    Personal Factors and Comorbidities Comorbidity 3+;Time since onset of injury/illness/exacerbation    Comorbidities CVA, diabetes, Seizures    Examination-Activity Limitations Bathing;Bed Mobility;Bend;Caring for Others;Carry;Dressing;Hygiene/Grooming;Lift;Locomotion Level;Reach Overhead;Self Feeding;Sit;Squat;Stairs;Stand;Transfers;Toileting    Examination-Participation Restrictions Cleaning;Community Activity;Driving;Laundry;Medication Management;Meal Prep;Occupation;Personal Finances;Shop;Yard Work;Volunteer    Stability/Clinical Decision Making Evolving/Moderate complexity    Rehab Potential Fair    PT Frequency 2x / week    PT Duration 12 weeks    PT Treatment/Interventions ADLs/Self Care Home Management;Cryotherapy;Electrical Stimulation;Moist Heat;Ultrasound;DME Instruction;Gait training;Stair training;Functional mobility training;Therapeutic exercise;Balance training;Patient/family education;Orthotic Fit/Training;Neuromuscular re-education;Wheelchair mobility training;Manual techniques;Passive range of motion;Dry needling;Energy conservation;Taping;Visual/perceptual remediation/compensation;Joint Manipulations    PT Next Visit Plan Instruct in home  program exercises; transfer assessment when appropriate; Assess standing    PT Home Exercise Plan Discussed performing quad sets, gluteal sets, bridgining as able- Discussed with patient and Dad    Consulted and Agree with Plan of Care Patient;Family member/caregiver    Family Member Consulted [name] and Dad- [name]             Patient will benefit from skilled therapeutic intervention in order to improve the following deficits and impairments:  Abnormal gait, Decreased activity tolerance, Decreased balance, Decreased cognition, Cardiopulmonary status limiting activity, Decreased coordination, Decreased endurance, Decreased knowledge of use of DME, Decreased mobility, Decreased range of motion, Decreased safety awareness, Decreased strength, Difficulty walking, Hypomobility, Impaired perceived functional ability, Impaired flexibility, Impaired UE functional use, Impaired vision/preception, Improper body mechanics, Postural dysfunction, Obesity  Visit Diagnosis: Muscle weakness (generalized)  Abnormality of gait and mobility  Unsteadiness on feet     Problem List Patient Active Problem List   Diagnosis Date Noted   Sepsis due to vancomycin resistant Enterococcus species (HCC) 06/06/2019   SIRS (systemic inflammatory response syndrome) (HCC) 06/05/2019   Acute lower UTI 06/05/2019   VRE (vancomycin-resistant Enterococci) infection 06/05/2019   Anemia 06/05/2019   Skin ulcer of sacrum with necrosis of muscle (HCC)    Urinary retention    Type 2 diabetes mellitus without complication, with long-term current use of insulin (HCC)    Tachycardia    Lower extremity edema    Acute metabolic encephalopathy    Obstructive sleep apnea    Morbid obesity with BMI of 60.0-69.9, adult (HCC)    Goals of care, counseling/discussion    Palliative care encounter    Sepsis (HCC) 04/27/2019   H/O insulin dependent diabetes mellitus 04/27/2019   History of CVA with residual deficit 04/27/2019    Seizure disorder (HCC) 04/27/2019   Decubitus ulcer of sacral region, stage 4 (HCC) 04/27/2019    Precious Bard, PT, DPT  02/03/2021, 5:16 PM  [facility] MAIN [facility] SERVICES [address] [place], [place], [address] Phone: [phone]   Fax:  [phone]  Name: Paul Hall MRN: [id number] Date of Birth: [birth year]

## 2021-02-03 NOTE — Therapy (Signed)
[facility] MAIN [facility] SERVICES [address] [place], [place], [address] Phone: [phone]   Fax:  [phone]  Occupational Therapy Treatment  Patient Details  Name: Paul Hall MRN: [id number] Date of Birth: Oct 27, 1995 Referring Provider (OT): Lenise Herald   Encounter Date: 02/03/2021   OT End of Session - 02/03/21 1648     Visit Number 12    Number of Visits 24    Date for OT Re-Evaluation 03/24/21    Authorization Type Progress report period starting 12/15/2020    OT Start Time 1552    OT Stop Time 1640    OT Time Calculation (min) 48 min    Activity Tolerance Patient tolerated treatment well    Behavior During Therapy [facility] for tasks assessed/performed;Anxious             Past Medical History:  Diagnosis Date   Diabetes mellitus (HCC)    Hypertension    Stroke [facility])     Past Surgical History:  Procedure Laterality Date   IVC FILTER PLACEMENT ([facility] HX)     LEG SURGERY     PEG TUBE PLACEMENT     TRACHEOSTOMY      There were no vitals filed for this visit.   Subjective Assessment - 02/03/21 1647     Subjective  Pt.'s transportation was late picking him up today. The OT appointment time was shifted to a later afternoon appointment time.   Patient is accompanied by: Family member    Pertinent History Pt. is a 25 y.o. male who was hospitalized, and diagnosed with COVID-19, sustained a CVA with resultant quadreplegia in 01/2019. Pt. was hospitalized with VRE  UTI. Pt. PMHx includes: urinary retention, Seizure Disorder, Obstructive Sleep Disorder, DM Type II, and Morbid obesity. Pt. resides at home with his parents.    Currently in Pain? Yes    Pain Score 1    1.5   Pain Location Hand    Pain Orientation Right;Left            OT TREATMENT     Estim attended:   Pt. tolerated neuromuscular estim  for 8 min., intensity 34, cycle time 10/10,  duty cycle 50%. Applied to the right wrist, and digit extensors,  followed by  8 min. Applied to the left wrist, and digit extensors with constant monitoring 100% of the time, with the intensity set to 34.    Therapeutic Exercise:   Patient tolerated active range of motion reps for bilateral shoulder flexion abduction. Passive range of motion for bilateral elbow flexion and extension with slow prolonged gentle stretching for elbow extension, wrist extension, and digit extension. Patient tolerated moist heat modality to his bilateral hands prior to range of motion.   Pt. continues to present with flexor tone, and tightness in the bilateral elbows, forearms, wrist, and digits however presented with less tightness, and less pain today. Tone, and tightness limit functional reaching, and active engagement of the BUEs during ADLs, and IADLs. Patient tolerated range of motion, and stretching to his bilateral elbows, wrists, and digits. Pt. continues to present with active wrist, and MP extension, however presents with MP hyperextension with PIP/DIP flexion when attempting active digit MP extension. Pt. tolerated Estim to the right, and left wrist, and digit extensors. Pt. Required several position adjustments.  Patient continues to work on normalizing tone in the bilateral upper extremities in order to improve range of motion,  and facilitate active range of motion in order to increase engagement  in ADLs and IADL tasks.                            OT Education - 02/03/21 1648     Education Details BUE functioning/postioning    Person(s) Educated Patient    Methods Explanation    Comprehension Verbalized understanding;Verbal cues required;Need further instruction                 OT Long Term Goals - 01/20/21 1311       OT LONG TERM GOAL #1   Title Pt. will improve FOTO score by 2 points for clinically significant improvements during ADLs, and IADLs,    Baseline 10th Visit: FOTO score 42 Eval: FOTO score: 41    Time 12    Period Weeks     Status On-going    Target Date 03/24/21      OT LONG TERM GOAL #2   Title Pt. will improve right shoulder flexion by 10 degrees to assist caregivers with UE dressing    Baseline 10th visit: ROM  R 65 scaption(95) Attempts for Active flexion result in scaption. Eval: R: 62(94), L: 114(120    Time 12    Period Weeks    Status On-going    Target Date 03/24/21      OT LONG TERM GOAL #3   Title Pt. will improve right Abduction by 10 degrees to assist caregivers with underarm hygiene care.    Baseline 10th visit: 86(95) Eval: R: 76(93)    Time 12    Period Weeks    Status On-going    Target Date 03/24/21      OT LONG TERM GOAL #4   Title Pt. will demonstrate visual compensatory strategies for navigating through his environment.    Baseline 10th visit: continues to present with limited vision. Eval: Limited    Time 12    Period Weeks    Status On-going    Target Date 03/24/21      OT LONG TERM GOAL #5   Title Pt. will demonstrate visual compensatory strategies for tabletop tasks within his near space.    Baseline 10th visit: Pt. continues to present with limited vision. Eval: Pt. has difficulty    Time 12    Period Weeks    Target Date 03/24/21      OT LONG TERM GOAL #6   Title Pt. will perform hand to mouth patterns with min A in preparation for self-feeding.    Baseline 10th visit: Pt. is able to perfrom hand to mouth patterns, however reports weakness in his hand  limits a secure grasp on the utensi Pt. requires assist to perform. Eval: Pt. has difficulty    Time 12    Period Weeks    Status On-going    Target Date 03/24/21      OT LONG TERM GOAL #7   Title Pt. will perform one step self-grooming tasks with minA.    Baseline 10th visit: Pt. has difficulty sustaining the UEs in elevation for the duration required to perfrom self-grooming tasks thoroughly.Eval: pt. has difficulty    Time 12    Period Weeks    Status On-going    Target Date 03/24/21                    Plan - 02/03/21 1652     Clinical Impression Statement Pt. continues to present with flexor tone, and tightness in the bilateral  elbows, forearms, wrist, and digits however presented with less tightness, and less pain today. Tone, and tightness limit functional reaching, and active engagement of the BUEs during ADLs, and IADLs. Patient tolerated range of motion, and stretching to his bilateral elbows, wrists, and digits. Pt. continues to present with active wrist, and MP extension, however presents with MP hyperextension with PIP/DIP flexion when attempting active digit MP extension. Pt. tolerated Estim to the right, and left wrist, and digit extensors. Pt. Required several position adjustments.  Patient continues to work on normalizing tone in the bilateral upper extremities in order to improve range of motion,  and facilitate active range of motion in order to increase engagement in ADLs and IADL tasks.       OT Occupational Profile and History Detailed Assessment- Review of Records and additional review of physical, cognitive, psychosocial history related to current functional performance    Occupational performance deficits (Please refer to evaluation for details): ADL's;IADL's    Body Structure / Function / Physical Skills ADL;FMC;ROM;Dexterity;IADL;Endurance;Obesity;Strength;UE functional use    Rehab Potential Good    Clinical Decision Making Multiple treatment options, significant modification of task necessary    Comorbidities Affecting Occupational Performance: Presence of comorbidities impacting occupational performance    Modification or Assistance to Complete Evaluation  Max significant modification of tasks or assist is necessary to complete    OT Frequency 2x / week    OT Duration 12 weeks    OT Treatment/Interventions Self-care/ADL training;Therapeutic exercise;Neuromuscular education;Visual/perceptual remediation/compensation;Patient/family education;Therapeutic  activities;DME and/or AE instruction;Passive range of motion;Moist Heat;Electrical Stimulation    Consulted and Agree with Plan of Care Patient             Patient will benefit from skilled therapeutic intervention in order to improve the following deficits and impairments:   Body Structure / Function / Physical Skills: ADL, FMC, ROM, Dexterity, IADL, Endurance, Obesity, Strength, UE functional use       Visit Diagnosis: Muscle weakness (generalized)  Other lack of coordination    Problem List Patient Active Problem List   Diagnosis Date Noted   Sepsis due to vancomycin resistant Enterococcus species (HCC) 06/06/2019   SIRS (systemic inflammatory response syndrome) (HCC) 06/05/2019   Acute lower UTI 06/05/2019   VRE (vancomycin-resistant Enterococci) infection 06/05/2019   Anemia 06/05/2019   Skin ulcer of sacrum with necrosis of muscle (HCC)    Urinary retention    Type 2 diabetes mellitus without complication, with long-term current use of insulin (HCC)    Tachycardia    Lower extremity edema    Acute metabolic encephalopathy    Obstructive sleep apnea    Morbid obesity with BMI of 60.0-69.9, adult (HCC)    Goals of care, counseling/discussion    Palliative care encounter    Sepsis (HCC) 04/27/2019   H/O insulin dependent diabetes mellitus 04/27/2019   History of CVA with residual deficit 04/27/2019   Seizure disorder (HCC) 04/27/2019   Decubitus ulcer of sacral region, stage 4 (HCC) 04/27/2019    Olegario Messier, MS, OTR/L 02/03/2021, 5:07 PM  [place] [facility] MAIN [facility] SERVICES [address] [place], [place], [address] Phone: [phone]   Fax:  [phone]  Name: Cotton Beckley MRN: [id number] Date of Birth: [birth year]

## 2021-02-04 DIAGNOSIS — G4733 Obstructive sleep apnea (adult) (pediatric): Secondary | ICD-10-CM | POA: Diagnosis not present

## 2021-02-04 DIAGNOSIS — L8994 Pressure ulcer of unspecified site, stage 4: Secondary | ICD-10-CM | POA: Diagnosis not present

## 2021-02-04 DIAGNOSIS — I6389 Other cerebral infarction: Secondary | ICD-10-CM | POA: Diagnosis not present

## 2021-02-04 NOTE — Patient Instructions (Signed)
Recall medications according to category, answer yes/no questions regarding medications

## 2021-02-04 NOTE — Therapy (Signed)
[place] MAIN [facility] SERVICES [address] [place], [place], [address] Phone: [phone]   Fax:  [phone]  Speech Language Pathology Treatment  Patient Details  Name: Paul Hall MRN: [id number] Date of Birth: [birth year] Referring Provider (SLP): Collene Mares   Encounter Date: 02/03/2021   End of Session - 02/04/21 1555     Visit Number 13    Number of Visits 25    Date for SLP Re-Evaluation 03/09/21    Authorization Type [employer] [place] COMM PPO    Authorization Time Period 12/15/2020 thru 16/09/2020    Authorization - Visit Number 3    Progress Note Due on Visit 10    SLP Start Time 1400    SLP Stop Time  1500    SLP Time Calculation (min) 60 min    Activity Tolerance Patient tolerated treatment well             Past Medical History:  Diagnosis Date   Diabetes mellitus ([place])    Hypertension    Stroke [facility])     Past Surgical History:  Procedure Laterality Date   IVC FILTER PLACEMENT ([facility] HX)     LEG SURGERY     PEG TUBE PLACEMENT     TRACHEOSTOMY      There were no vitals filed for this visit.   Subjective Assessment - 02/04/21 1549     Subjective Patient pleasantly motivated for therapy. Father present for session    Patient is accompained by: Family member    Currently in Pain? No/denies                   ADULT SLP TREATMENT - 02/04/21 0001       Treatment Provided   Treatment provided Cognitive-Linquistic      Cognitive-Linquistic Treatment   Treatment focused on Cognition    Skilled Treatment COGNITIVE COMMUNICATION: Skilled treatment session focused on communicating current list of medications. Pt was able to answer yes/no questions targeting whether or not he was taking list of various medication with 50% accuracy improving to 90% with maximal cues. SLP further targeted understanding of each medicine by teaching pt how to categorize medicines as well as creating rhymes to  aid in recall              SLP Education - 02/04/21 1554     Education Details ways to improve recall and comprehension of medicines    Person(s) Educated Patient;Parent(s)    Methods Explanation;Demonstration;Verbal cues    Comprehension Verbalized understanding;Need further instruction              SLP Short Term Goals - 01/27/21 1354       SLP SHORT TERM GOAL #1   Title Using a good quality loud voice, pt will recall 2 of 3 memory strategies given minimal verbal cues.    Baseline goal met, revised to reflect progress    Time 10    Status On-going      SLP SHORT TERM GOAL #2   Title Pt will recall 5 or more items (medication list) after a 15 minute delay given moderate cues in order to increase independent during functional memory tasks.    Baseline goal revised to address functional deficits    Time 10    Status On-going      SLP SHORT TERM GOAL #3   Title After listening to functional information, pt will recall 2 pieces of functional information with 95% accuracy  given minimal assistance.    Time 10      SLP SHORT TERM GOAL #4   Title With minimal cues, pt will perform basic mental manipulation activities with 75% accuracy.    Status On-going              SLP Long Term Goals - 01/21/21 1404       SLP LONG TERM GOAL #1   Title Using a good quality loud voice, pt will record a journal of daily activities to aid in recall of information.    Baseline goal met, revised to reflect progress    Time 12    Period Weeks    Status Revised    Target Date 03/09/21      SLP LONG TERM GOAL #2   Title Pt will comprehend his list of medications in order to recall 6 medications with minimal assistance.    Status On-going    Target Date 03/09/21      SLP LONG TERM GOAL #3   Title Pt will verbally generate solutions to problems encountered in everyday living tasks with minimal assistance.    Baseline goal met, revised to reflect pt progress    Time 12    Period  Weeks    Status Revised    Target Date 03/09/21              Plan - 02/04/21 1555     Clinical Impression Statement Pt and his father report that pt's functional memory is great within daily activities. Pt excited to be more independent with recall of current medications. Skilled St intervention continues to be required to target his moderate cognitive communicaiton impairments in an effort to increase his functional independence and reduce caregiver burden.    Speech Therapy Frequency 2x / week    Duration 12 weeks    Treatment/Interventions Language facilitation;Cueing hierarchy;Internal/external aids;Functional tasks;SLP instruction and feedback;Patient/family education;Cognitive reorganization;Compensatory strategies    Potential to Achieve Goals Fair    Potential Considerations Severity of impairments;Co-morbidities    SLP Home Exercise Plan provided, see pt instructions section    Consulted and Agree with Plan of Care Patient;Family member/caregiver    Family Member Consulted pt's dad             Patient will benefit from skilled therapeutic intervention in order to improve the following deficits and impairments:   Cognitive communication deficit  History of CVA with residual deficit    Problem List Patient Active Problem List   Diagnosis Date Noted   Sepsis due to vancomycin resistant Enterococcus species ([place]) 06/06/2019   SIRS (systemic inflammatory response syndrome) ([place]) 06/05/2019   Acute lower UTI 06/05/2019   VRE (vancomycin-resistant Enterococci) infection 06/05/2019   Anemia 06/05/2019   Skin ulcer of sacrum with necrosis of muscle ([place])    Urinary retention    Type 2 diabetes mellitus without complication, with long-term current use of insulin (HCC)    Tachycardia    Lower extremity edema    Acute metabolic encephalopathy    Obstructive sleep apnea    Morbid obesity with BMI of 60.0-69.9, adult ([place])    Goals of care, counseling/discussion     Palliative care encounter    Sepsis ([place]) 04/27/2019   H/O insulin dependent diabetes mellitus 04/27/2019   History of CVA with residual deficit 04/27/2019   Seizure disorder ([place]) 04/27/2019   Decubitus ulcer of sacral region, stage 4 ([place]) 04/27/2019   Mirza Fessel B. Rutherford Nail, M.S., [place], [place] Pathologist Rehabilitation Services  Office [place] 02/04/2021, 3:58 PM  [place] MAIN [facility] SERVICES [address] [place], [place], [address] Phone: [phone]   Fax:  [phone]   Name: Kieth Hartis MRN: [id number] Date of Birth: 11/19/1995

## 2021-02-08 ENCOUNTER — Ambulatory Visit: Payer: BC Managed Care – PPO | Admitting: Occupational Therapy

## 2021-02-08 ENCOUNTER — Ambulatory Visit: Payer: BC Managed Care – PPO | Admitting: Speech Pathology

## 2021-02-08 ENCOUNTER — Encounter: Payer: Self-pay | Admitting: Occupational Therapy

## 2021-02-08 ENCOUNTER — Other Ambulatory Visit: Payer: Self-pay

## 2021-02-08 DIAGNOSIS — I6930 Unspecified sequelae of cerebral infarction: Secondary | ICD-10-CM | POA: Diagnosis not present

## 2021-02-08 DIAGNOSIS — R41841 Cognitive communication deficit: Secondary | ICD-10-CM | POA: Diagnosis not present

## 2021-02-08 DIAGNOSIS — R262 Difficulty in walking, not elsewhere classified: Secondary | ICD-10-CM | POA: Diagnosis not present

## 2021-02-08 DIAGNOSIS — R2689 Other abnormalities of gait and mobility: Secondary | ICD-10-CM | POA: Diagnosis not present

## 2021-02-08 DIAGNOSIS — R278 Other lack of coordination: Secondary | ICD-10-CM | POA: Diagnosis not present

## 2021-02-08 DIAGNOSIS — M6281 Muscle weakness (generalized): Secondary | ICD-10-CM | POA: Diagnosis not present

## 2021-02-08 DIAGNOSIS — R269 Unspecified abnormalities of gait and mobility: Secondary | ICD-10-CM | POA: Diagnosis not present

## 2021-02-08 DIAGNOSIS — R2681 Unsteadiness on feet: Secondary | ICD-10-CM | POA: Diagnosis not present

## 2021-02-08 NOTE — Therapy (Signed)
[facility] MAIN [facility] SERVICES [address] [place], [place], [address] Phone: [phone]   Fax:  [phone]  Occupational Therapy Treatment  Patient Details  Name: Paul Hall MRN: [id number] Date of Birth: 01-31-1996 Referring Provider (OT): Lenise Herald   Encounter Date: 02/08/2021   OT End of Session - 02/08/21 1635     Visit Number 13    Number of Visits 24    Date for OT Re-Evaluation 03/24/21    Authorization Type Progress report period starting 12/15/2020    OT Start Time 1520    OT Stop Time 1600    OT Time Calculation (min) 40 min    Activity Tolerance Patient tolerated treatment well    Behavior During Therapy [facility] for tasks assessed/performed;Anxious             Past Medical History:  Diagnosis Date   Diabetes mellitus (HCC)    Hypertension    Stroke [facility])     Past Surgical History:  Procedure Laterality Date   IVC FILTER PLACEMENT ([facility] HX)     LEG SURGERY     PEG TUBE PLACEMENT     TRACHEOSTOMY      There were no vitals filed for this visit.   Subjective Assessment - 02/08/21 1633     Subjective  Pt. reports right eye irritation.    Patient is accompanied by: Family member    Pertinent History Pt. is a 25 y.o. male who was hospitalized, and diagnosed with COVID-19, sustained a CVA with resultant quadreplegia in 01/2019. Pt. was hospitalized with VRE  UTI. Pt. PMHx includes: urinary retention, Seizure Disorder, Obstructive Sleep Disorder, DM Type II, and Morbid obesity. Pt. resides at home with his parents.    Currently in Pain? Yes    Pain Score 1     Pain Location Hand    Pain Orientation Right;Left    Pain Descriptors / Indicators Sore            OT TREATMENT     Estim attended:   Pt. tolerated neuromuscular estim  for 8 min., cycle time 10/10,  duty cycle 50%. Applied to the right wrist, and digit extensors, followed by  8 min. Applied to the bilateral wrist, and digit extensors  with the intensity set to 38, and constant monitoring 100% of the time. Manual assist was required for wrist and digit extension with each rep. To hold the position.    Therapeutic Exercise:   Patient tolerated active range of motion reps for bilateral shoulder flexion abduction. Passive range of motion for bilateral elbow flexion and extension with slow prolonged gentle stretching for elbow extension, wrist extension, and digit extension. Patient tolerated moist heat modality to his bilateral hands prior to range of motion.   Pt. presents with less soreness in the bilateral digits today.  Pt. continues to tolerate Estim well to the right, and left wrist, and digit extensors with manual assist. Pt. Required repositioning multiple times for pressure relief. Pt. continues to require assist with self-feeding, and self-grooming tasks. Patient continues to work on normalizing tone in the bilateral upper extremities in order to improve range of motion, and facilitate active range of motion in order to increase engagement in ADLs and IADL tasks.                      OT Education - 02/08/21 1635     Education Details BUE functioning/postioning    Person(s) Educated Patient  Methods Explanation    Comprehension Verbalized understanding;Verbal cues required;Need further instruction                 OT Long Term Goals - 01/20/21 1311       OT LONG TERM GOAL #1   Title Pt. will improve FOTO score by 2 points for clinically significant improvements during ADLs, and IADLs,    Baseline 10th Visit: FOTO score 42 Eval: FOTO score: 41    Time 12    Period Weeks    Status On-going    Target Date 03/24/21      OT LONG TERM GOAL #2   Title Pt. will improve right shoulder flexion by 10 degrees to assist caregivers with UE dressing    Baseline 10th visit: ROM  R 65 scaption(95) Attempts for Active flexion result in scaption. Eval: R: 62(94), L: 114(120    Time 12    Period Weeks     Status On-going    Target Date 03/24/21      OT LONG TERM GOAL #3   Title Pt. will improve right Abduction by 10 degrees to assist caregivers with underarm hygiene care.    Baseline 10th visit: 86(95) Eval: R: 76(93)    Time 12    Period Weeks    Status On-going    Target Date 03/24/21      OT LONG TERM GOAL #4   Title Pt. will demonstrate visual compensatory strategies for navigating through his environment.    Baseline 10th visit: continues to present with limited vision. Eval: Limited    Time 12    Period Weeks    Status On-going    Target Date 03/24/21      OT LONG TERM GOAL #5   Title Pt. will demonstrate visual compensatory strategies for tabletop tasks within his near space.    Baseline 10th visit: Pt. continues to present with limited vision. Eval: Pt. has difficulty    Time 12    Period Weeks    Target Date 03/24/21      OT LONG TERM GOAL #6   Title Pt. will perform hand to mouth patterns with min A in preparation for self-feeding.    Baseline 10th visit: Pt. is able to perfrom hand to mouth patterns, however reports weakness in his hand  limits a secure grasp on the utensi Pt. requires assist to perform. Eval: Pt. has difficulty    Time 12    Period Weeks    Status On-going    Target Date 03/24/21      OT LONG TERM GOAL #7   Title Pt. will perform one step self-grooming tasks with minA.    Baseline 10th visit: Pt. has difficulty sustaining the UEs in elevation for the duration required to perfrom self-grooming tasks thoroughly.Eval: pt. has difficulty    Time 12    Period Weeks    Status On-going    Target Date 03/24/21                   Plan - 02/08/21 1637     Clinical Impression Statement Pt. presents with less soreness in the bilateral digits today.  Pt. continues to tolerate Estim well to the right, and left wrist, and digit extensors with manual assist. Pt. Required repositioning multiple times for pressure relief. Pt. continues to require  assist with self-feeding, and self-grooming tasks. Patient continues to work on normalizing tone in the bilateral upper extremities in order to improve range of motion,  and facilitate active range of motion in order to increase engagement in ADLs and IADL tasks.    OT Occupational Profile and History Detailed Assessment- Review of Records and additional review of physical, cognitive, psychosocial history related to current functional performance    Occupational performance deficits (Please refer to evaluation for details): ADL's;IADL's    Body Structure / Function / Physical Skills ADL;FMC;ROM;Dexterity;IADL;Endurance;Obesity;Strength;UE functional use    Rehab Potential Good    Clinical Decision Making Multiple treatment options, significant modification of task necessary    Comorbidities Affecting Occupational Performance: Presence of comorbidities impacting occupational performance    Modification or Assistance to Complete Evaluation  Max significant modification of tasks or assist is necessary to complete    OT Frequency 2x / week    OT Duration 12 weeks    OT Treatment/Interventions Self-care/ADL training;Therapeutic exercise;Neuromuscular education;Visual/perceptual remediation/compensation;Patient/family education;Therapeutic activities;DME and/or AE instruction;Passive range of motion;Moist Heat;Electrical Stimulation    Consulted and Agree with Plan of Care Patient             Patient will benefit from skilled therapeutic intervention in order to improve the following deficits and impairments:   Body Structure / Function / Physical Skills: ADL, FMC, ROM, Dexterity, IADL, Endurance, Obesity, Strength, UE functional use       Visit Diagnosis: Muscle weakness (generalized)    Problem List Patient Active Problem List   Diagnosis Date Noted   Sepsis due to vancomycin resistant Enterococcus species (HCC) 06/06/2019   SIRS (systemic inflammatory response syndrome) (HCC)  06/05/2019   Acute lower UTI 06/05/2019   VRE (vancomycin-resistant Enterococci) infection 06/05/2019   Anemia 06/05/2019   Skin ulcer of sacrum with necrosis of muscle (HCC)    Urinary retention    Type 2 diabetes mellitus without complication, with long-term current use of insulin (HCC)    Tachycardia    Lower extremity edema    Acute metabolic encephalopathy    Obstructive sleep apnea    Morbid obesity with BMI of 60.0-69.9, adult (HCC)    Goals of care, counseling/discussion    Palliative care encounter    Sepsis (HCC) 04/27/2019   H/O insulin dependent diabetes mellitus 04/27/2019   History of CVA with residual deficit 04/27/2019   Seizure disorder (HCC) 04/27/2019   Decubitus ulcer of sacral region, stage 4 (HCC) 04/27/2019    Olegario Messier, MS, OTR/L 02/08/2021, 4:39 PM  [place] [facility] MAIN [facility] SERVICES [address] [place], [place], [address] Phone: [phone]   Fax:  [phone]  Name: Paul Hall MRN: [id number] Date of Birth: 1995/11/18

## 2021-02-09 NOTE — Therapy (Signed)
[place] MAIN [facility] SERVICES [address] [place], [place], [address] Phone: [phone]   Fax:  [phone]  Speech Language Pathology Treatment  Patient Details  Name: Paul Hall MRN: [id number] Date of Birth: 1995/05/14 Referring Provider (SLP): Collene Mares   Encounter Date: 02/08/2021   End of Session - 02/09/21 1753     Visit Number 14    Number of Visits 25    Date for SLP Re-Evaluation 03/09/21    Authorization Type [employer] [place] COMM PPO    Authorization Time Period 12/15/2020 thru 16/09/2020    Authorization - Visit Number 4    Progress Note Due on Visit 10    SLP Start Time 1400    SLP Stop Time  1500    SLP Time Calculation (min) 60 min    Activity Tolerance Patient tolerated treatment well             Past Medical History:  Diagnosis Date   Diabetes mellitus ([place])    Hypertension    Stroke [facility])     Past Surgical History:  Procedure Laterality Date   IVC FILTER PLACEMENT ([facility] HX)     LEG SURGERY     PEG TUBE PLACEMENT     TRACHEOSTOMY      There were no vitals filed for this visit.   Subjective Assessment - 02/09/21 1749     Subjective pt pleasant, father present for session    Patient is accompained by: Family member    Currently in Pain? No/denies                   ADULT SLP TREATMENT - 02/09/21 0001       Treatment Provided   Treatment provided Cognitive-Linquistic      Cognitive-Linquistic Treatment   Treatment focused on Cognition;Patient/family/caregiver education    Skilled Treatment COGNITIVE COMMUNICATION: skilled treatment session focused on pt's functional memory, attention and verbal communication (specifically organization of verbal message). Pt was Mod I with conveying functional information about previous doctors' appts as well as information regarding his medications. Pt's father reports good ability when recalling therpay tasks later in the day. Pt  also independently recalled SLP instruciton to talk about recent football game over the weekend.              SLP Education - 02/09/21 1752     Education Details great progress towards goals    Person(s) Educated Patient;Parent(s)    Methods Explanation;Demonstration;Verbal cues    Comprehension Verbalized understanding;Returned demonstration              SLP Short Term Goals - 01/27/21 1354       SLP SHORT TERM GOAL #1   Title Using a good quality loud voice, pt will recall 2 of 3 memory strategies given minimal verbal cues.    Baseline goal met, revised to reflect progress    Time 10    Status On-going      SLP SHORT TERM GOAL #2   Title Pt will recall 5 or more items (medication list) after a 15 minute delay given moderate cues in order to increase independent during functional memory tasks.    Baseline goal revised to address functional deficits    Time 10    Status On-going      SLP SHORT TERM GOAL #3   Title After listening to functional information, pt will recall 2 pieces of functional information with 95% accuracy given minimal assistance.  Time 10      SLP SHORT TERM GOAL #4   Title With minimal cues, pt will perform basic mental manipulation activities with 75% accuracy.    Status On-going              SLP Long Term Goals - 01/21/21 1404       SLP LONG TERM GOAL #1   Title Using a good quality loud voice, pt will record a journal of daily activities to aid in recall of information.    Baseline goal met, revised to reflect progress    Time 12    Period Weeks    Status Revised    Target Date 03/09/21      SLP LONG TERM GOAL #2   Title Pt will comprehend his list of medications in order to recall 6 medications with minimal assistance.    Status On-going    Target Date 03/09/21      SLP LONG TERM GOAL #3   Title Pt will verbally generate solutions to problems encountered in everyday living tasks with minimal assistance.    Baseline goal  met, revised to reflect pt progress    Time 12    Period Weeks    Status Revised    Target Date 03/09/21              Plan - 02/09/21 1753     Clinical Impression Statement Pt and his father continue to report that pt's cognitive abilities are functional within the tasks that pt completes each day. Father reports that pt's verbal communication continues to be improved when conveying information. Pt and his father didn't notice any areas of cognitive communicaiton deficits during functional tasks since last treatment session. Given pt's progress towards his goals, will work towards developing HEP and begin conversation towards functional mastery of goals.    Speech Therapy Frequency 2x / week    Duration 12 weeks    Treatment/Interventions Language facilitation;Cueing hierarchy;Internal/external aids;Functional tasks;SLP instruction and feedback;Patient/family education;Cognitive reorganization;Compensatory strategies    Potential to Achieve Goals Fair    Potential Considerations Severity of impairments;Co-morbidities;Medical prognosis    SLP Home Exercise Plan provided, see pt instructions section    Consulted and Agree with Plan of Care Patient;Family member/caregiver    Family Member Consulted pt's dad             Patient will benefit from skilled therapeutic intervention in order to improve the following deficits and impairments:   Cognitive communication deficit  History of CVA with residual deficit    Problem List Patient Active Problem List   Diagnosis Date Noted   Sepsis due to vancomycin resistant Enterococcus species ([place]) 06/06/2019   SIRS (systemic inflammatory response syndrome) ([place]) 06/05/2019   Acute lower UTI 06/05/2019   VRE (vancomycin-resistant Enterococci) infection 06/05/2019   Anemia 06/05/2019   Skin ulcer of sacrum with necrosis of muscle ([place])    Urinary retention    Type 2 diabetes mellitus without complication, with long-term current use of  insulin (HCC)    Tachycardia    Lower extremity edema    Acute metabolic encephalopathy    Obstructive sleep apnea    Morbid obesity with BMI of 60.0-69.9, adult ([place])    Goals of care, counseling/discussion    Palliative care encounter    Sepsis ([place]) 04/27/2019   H/O insulin dependent diabetes mellitus 04/27/2019   History of CVA with residual deficit 04/27/2019   Seizure disorder ([place]) 04/27/2019   Decubitus ulcer of sacral region,  stage 4 ([place]) 04/27/2019   Rosamaria Donn B. Rutherford Nail M.S., CCC-SLP, [place] Pathologist Rehabilitation Services Office [phone]  Stormy Fabian 02/09/2021, 5:57 PM  [place] MAIN [facility] SERVICES [address] [place], [place], [address] Phone: [phone]   Fax:  [phone]   Name: Cheryl Stabenow MRN: [id number] Date of Birth: [birth year]

## 2021-02-09 NOTE — Patient Instructions (Signed)
Continue verbal communication to aid in fluency of communication

## 2021-02-10 ENCOUNTER — Ambulatory Visit: Payer: BC Managed Care – PPO | Admitting: Occupational Therapy

## 2021-02-10 ENCOUNTER — Encounter: Payer: Self-pay | Admitting: Physical Therapy

## 2021-02-10 ENCOUNTER — Ambulatory Visit: Payer: BC Managed Care – PPO | Admitting: Speech Pathology

## 2021-02-10 ENCOUNTER — Encounter: Payer: Self-pay | Admitting: Occupational Therapy

## 2021-02-10 ENCOUNTER — Other Ambulatory Visit: Payer: Self-pay

## 2021-02-10 ENCOUNTER — Ambulatory Visit: Payer: BC Managed Care – PPO | Admitting: Physical Therapy

## 2021-02-10 ENCOUNTER — Encounter: Payer: BC Managed Care – PPO | Admitting: Speech Pathology

## 2021-02-10 DIAGNOSIS — R262 Difficulty in walking, not elsewhere classified: Secondary | ICD-10-CM | POA: Diagnosis not present

## 2021-02-10 DIAGNOSIS — R269 Unspecified abnormalities of gait and mobility: Secondary | ICD-10-CM

## 2021-02-10 DIAGNOSIS — R278 Other lack of coordination: Secondary | ICD-10-CM

## 2021-02-10 DIAGNOSIS — R2689 Other abnormalities of gait and mobility: Secondary | ICD-10-CM | POA: Diagnosis not present

## 2021-02-10 DIAGNOSIS — R41841 Cognitive communication deficit: Secondary | ICD-10-CM | POA: Diagnosis not present

## 2021-02-10 DIAGNOSIS — R2681 Unsteadiness on feet: Secondary | ICD-10-CM | POA: Diagnosis not present

## 2021-02-10 DIAGNOSIS — M6281 Muscle weakness (generalized): Secondary | ICD-10-CM | POA: Diagnosis not present

## 2021-02-10 DIAGNOSIS — I6930 Unspecified sequelae of cerebral infarction: Secondary | ICD-10-CM | POA: Diagnosis not present

## 2021-02-10 NOTE — Therapy (Signed)
[facility] MAIN [facility] SERVICES [address] [place], [place], [address] Phone: [phone]   Fax:  [phone]  Physical Therapy Treatment  Patient Details  Name: Paul Hall MRN: [id number] Date of Birth: 1995/07/27 Referring Provider (PT): Lenise Herald, PA-C   Encounter Date: 02/10/2021   PT End of Session - 02/10/21 1329     Visit Number 6    Number of Visits 25    Date for PT Re-Evaluation 03/29/21    Authorization Type [employer] and [employer]- Need auth past 12th visit    Authorization - Visit Number 5    Authorization - Number of Visits 12    Progress Note Due on Visit 10    PT Start Time 1100    PT Stop Time 1146    PT Time Calculation (min) 46 min    Equipment Utilized During Treatment Other (comment)    Activity Tolerance Patient tolerated treatment well    Behavior During Therapy WFL for tasks assessed/performed             Past Medical History:  Diagnosis Date   Diabetes mellitus (HCC)    Hypertension    Stroke [facility])     Past Surgical History:  Procedure Laterality Date   IVC FILTER PLACEMENT ([facility] HX)     LEG SURGERY     PEG TUBE PLACEMENT     TRACHEOSTOMY      There were no vitals filed for this visit.   Subjective Assessment - 02/10/21 1326     Subjective Patient presents with father. States he is doing well however does state he is tired today. Has been compliant with HEP and stands using w/c features and assist of mom on Saturday's.    Patient is accompained by: Family member   [name]; [name]   Pertinent History Patient is a 25 y.o. male with insulin dependent diabetes and morbid obesity (BMI 65) who was admitted with ARDS in the setting of Covid-19 On10/18/2020 History taken per admission note dated 01/20/2019. On 11/13, the patient was noticed to have RUE twitching. A noncontrast head CT was done and discovered an ischemic stroke and a subdural hemorrhage with slight midline  shift. ICP management was initiated by keeping Na > 145 (on 254ml/hr normal saline) and head of the bed > 90 degrees. Neurosurgery was consulted and does not recommend surgical decompression at this time. His heparin gtt that was being used to treat his bilateral DVTs was stopped and vascular surgery was consulted to place and IVC filter on 11/14. On 11/15 he was transferred back to the MICU after his CVAs did not worsen or evolve. He was not able to be weaned from the vent and underwent trach placement on 11/18 and G-tube placement on 11/19.    On 11/26 patient was noted to have "inward eye deviation" and "downward eye deviation with occasional twitch in the left eye" per nursing, concerning for seizure-like activity. He was restarted on cvEEG today and continued to Keppra 1 g BID. CT head wo contrast was obtained which showed new left frontal IPH with 1 cm midline shift and subfalcine herniation, and prior large left hemispheric infarct. . The patient's neurological exam has remained stable with brainstem reflexes intact, but functionally quadriplegic. with inward/downward eye deviation on 11/26 while on maintenance Keppra. There is no pathological intracranial focality in epilepsy that would make the patient's gaze present in such a manner. There was mild dysconjugate gaze with the right eye  out, however, this was not fixed, there was no rhythmic, stereotyped movements to suggest seizures. cvEEG monitoring 11/27-11/28 revealed no seizures only focal slowing consistent with his known vascular insult. There was a push button event for extremity shaking that had no ccvEEG correlate.  - Keppra 2 g BID.     Subacute ischemic stroke (left, ACA/MCA/PCA distribution)  Likely secondary to COVID hypercoagulable state. Carotid Dopplers negative. Stroke likely subacute given 3 serial CTs demonstrated no evolution of stroke size. Will get statin once CK/rhabdomyolysis and abnl LFTs resolve. 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    Limitations Lifting;Standing;Walking;Writing;Reading;Sitting;House hold activities    How long can you sit comfortably? Patient reports uable to sit up long < 4 hours due to buttock discomfort    How long can you stand comfortably? Patient is currently unable to stand    How long can you walk comfortably? Patient is currently unable to walk    Diagnostic tests FINDINGS:  Brain: Left cerebral convexity cortical hypodensity with associated  cortical calcification extending from the frontal to the parietal  lobe. Extensive area of volume loss and low-density compatible with  chronic infarction. This is noted on prior reports from [facility].  Probable chronic watershed infarct. Additional small areas of  hypodensity in the right frontal white matter consistent with  chronic ischemia.     Negative for acute infarct. No acute hemorrhage or mass. Ventricle  size normal without midline shift.     Vascular: Normal arterial flow voids.     Skull: Negative     Sinuses/Orbits: Mucosal edema paranasal sinuses with air-fluid level  left sphenoid sinus. Negative orbit     Other: None     IMPRESSION:  Extensive watershed type infarct over the left cerebral convexity  with cortical calcification. This appears chronic. No associated  hemorrhage, hydrocephalus, or midline shift.     These results were called by telephone at the time of interpretation  on 05/09/2019 at 3:12 pm to provider Alford Highland , who verbally  acknowledged these results.        Electronically Signed    By: Marlan Palau M.D.    On: 05/09/2019 15:13       Result History    CT HEAD WO CONTRAST (Order #[id number]) on 05/09/2019 - Order Result History Report    Patient Stated Goals I want to be able to possibly walk again    Currently in Pain? No/denies    Pain Onset More than a month ago    Pain Onset More than a month ago               Transfer from Power wheelchair to treatment table utilizing hoyer lift  and 3 x assist with 1 staff and 2 SPT.      Seated with pillow behind head on bariatric lift table with back inclined and foot rest down. Pt felt as if he was sliding so foot rest was elevated mid-way through session. -A-P weight shift 10x with CGA -lateral weight shift 8x each side, PT on either side of patient providing tactile cueing for range of lean;  -forward and cross body punches modified with upright posture without support x90 seconds each ; very challenging for RUE - min A for movement -"penguin" - lateral weight shifting with increased lateral lean and light resistance when returning to midline, no back support; x8 reps each side.  -alternating LAQ x10 BLE -quad set, tactile cueing for improved muscle  recruitment; 2x8 BLE, 5 second hold each rep during 2nd set. -scapular retraction/trunk extension against mod-heavy resistance of PT and SPT on either side of pt, x10 reps with concentric phase of extension lasting ~5 seconds each.   Shoes doffed/donned for session.    Pt educated throughout session about proper posture and technique with exercises. Improved exercise technique, movement at target joints, use of target muscles after min to mod verbal, visual, tactile cues.     Patient is very pleasant and demonstrates excellent motivation throughout session, often performing increased reps/hold time or asking for more resistance. Pt trunk stability continues to progress this session with increased repetitions/exercises paired with decreased need for assistance (mostly SUP in short/long sitting). Pertubation/reaching to limits of stability challenges were continued . Pt LE strength continues to be an area for improvement, right more than left. Pt and father were educated on tips to boost pt hips further into chair for improved positioning and posture. Patient will continue to benefit from skilled physical therapy to improve his core strength, as well as to improve his quality of  life.                           PT Short Term Goals - 01/04/21 1505       PT SHORT TERM GOAL #1   Title Pt will be independent with HEP in order to improve strength and balance in order to decrease fall risk and improve function at home and work.    Baseline 01/04/2021= No formal HEP in place.    Time 6    Period Weeks    Status New    Target Date 02/15/21               PT Long Term Goals - 01/19/21 0951       PT LONG TERM GOAL #1   Title Patient will increase BLE gross strength by 1/2 muscle grade to improve functional strength for improved independence with potential gait, increased standing tolerance and increased ADL ability.    Baseline 01/04/2021- Patient presents with 1/5 to 3-/5 B LE strength with MMT.    Time 12    Period Weeks    Status New    Target Date 03/29/21      PT LONG TERM GOAL #2   Title Patient will tolerate sitting unsupported demonstrating erect sitting posture for 15 minutes with CGA to demonstrate improved back extensor strength and improved sitting tolerance.    Baseline 01/04/2021- Patient confied to sitting in lift chair or electric power chair with back support and unable to sit upright without physical assistance.    Time 12    Period Weeks    Status New    Target Date 03/29/21      PT LONG TERM GOAL #3   Title Patient will demonstrate ability to perform static standing in // bars > 2 min with Max Assist  without loss of balance and fair posture for improved overall strength for pre-gait and transfer activities.    Baseline 01/04/2021= Patient current uanble to stand- Dependent on hoyer or sit to stand lift for transfers.    Time 12    Period Weeks    Status New    Target Date 03/29/21      PT LONG TERM GOAL #4   Title Pt will improve FOTO score by 10 points or more demonstrating improved perceived functional ability    Baseline FOTO 7 on 10/17  Time 12    Period Weeks    Status New    Target Date 03/29/21                     Patient will benefit from skilled therapeutic intervention in order to improve the following deficits and impairments:     Visit Diagnosis: Abnormality of gait and mobility  Other lack of coordination  Difficulty in walking, not elsewhere classified  Unsteadiness on feet  Muscle weakness (generalized)  Other abnormalities of gait and mobility     Problem List Patient Active Problem List   Diagnosis Date Noted   Sepsis due to vancomycin resistant Enterococcus species (HCC) 06/06/2019   SIRS (systemic inflammatory response syndrome) (HCC) 06/05/2019   Acute lower UTI 06/05/2019   VRE (vancomycin-resistant Enterococci) infection 06/05/2019   Anemia 06/05/2019   Skin ulcer of sacrum with necrosis of muscle (HCC)    Urinary retention    Type 2 diabetes mellitus without complication, with long-term current use of insulin (HCC)    Tachycardia    Lower extremity edema    Acute metabolic encephalopathy    Obstructive sleep apnea    Morbid obesity with BMI of 60.0-69.9, adult (HCC)    Goals of care, counseling/discussion    Palliative care encounter    Sepsis (HCC) 04/27/2019   H/O insulin dependent diabetes mellitus 04/27/2019   History of CVA with residual deficit 04/27/2019   Seizure disorder (HCC) 04/27/2019   Decubitus ulcer of sacral region, stage 4 (HCC) 04/27/2019    Basilia Jumbo PT, DPT  [facility] MAIN [facility] SERVICES [address] [place], [place], [address] Phone: [phone]   Fax:  [phone]  Name: Paul Hall MRN: [id number] Date of Birth: January 17, 1996

## 2021-02-10 NOTE — Therapy (Signed)
[place] [facility] MAIN [facility] SERVICES [address] [place], [place], [address] Phone: [phone]   Fax:  [phone]  Occupational Therapy Treatment  Patient Details  Name: Paul Hall MRN: [id number] Date of Birth: 02/06/1996 Referring Provider (OT): Lenise Herald   Encounter Date: 02/10/2021   OT End of Session - 02/10/21 1323     Visit Number 14    Number of Visits 24    Date for OT Re-Evaluation 03/24/21    Authorization Type Progress report period starting 12/15/2020    OT Start Time 1145    OT Stop Time 1230    OT Time Calculation (min) 45 min    Activity Tolerance Patient tolerated treatment well    Behavior During Therapy [facility] for tasks assessed/performed;Anxious             Past Medical History:  Diagnosis Date   Diabetes mellitus (HCC)    Hypertension    Stroke [facility])     Past Surgical History:  Procedure Laterality Date   IVC FILTER PLACEMENT ([facility] HX)     LEG SURGERY     PEG TUBE PLACEMENT     TRACHEOSTOMY      There were no vitals filed for this visit.   Subjective Assessment - 02/10/21 1323     Subjective  Pt. requires repositioning for pressure relieve periodically throughout the session.    Patient is accompanied by: Family member    Pertinent History Pt. is a 25 y.o. male who was hospitalized, and diagnosed with COVID-19, sustained a CVA with resultant quadreplegia in 01/2019. Pt. was hospitalized with VRE  UTI. Pt. PMHx includes: urinary retention, Seizure Disorder, Obstructive Sleep Disorder, DM Type II, and Morbid obesity. Pt. resides at home with his parents.    Currently in Pain? Yes    Pain Score 8     Pain Location Sacrum    Pain Descriptors / Indicators Pressure            OT TREATMENT     Estim attended:   Pt. tolerated neuromuscular estim  for 8 min., cycle time 10/10,  duty cycle 50%. Applied to the right wrist, and digit extensors, followed by  8 min. Applied to the bilateral  wrist, and digit extensors with the intensity set to 34, and constant monitoring 100% of the time. Manual assist was required for wrist and digit extension with each rep. To hold the position.    Therapeutic Exercise:   Patient tolerated active range of motion reps for bilateral shoulder flexion, abduction. Passive range of motion for bilateral elbow flexion and extension with slow prolonged gentle stretching for elbow extension, forearm supination, wrist extension, and digit extension. Patient tolerated moist heat modality to his bilateral hands prior to range of motion.   Pt. presents with no reports of eye irritation. Pt. required repositioning multiple times for pressure relief. Pt. continues to tolerate Estim well to the right, and left wrist, and digit extensors with manual assist. Pt. continues to require assist with self-feeding, and self-grooming tasks. Patient continues to work on normalizing tone in the bilateral upper extremities in order to improve range of motion, and facilitate active range of motion in order to increase engagement in ADLs and IADL tasks.                             OT Education - 02/10/21 1323     Education Details BUE functioning/postioning  Person(s) Educated Patient    Methods Explanation    Comprehension Verbalized understanding;Verbal cues required;Need further instruction                 OT Long Term Goals - 01/20/21 1311       OT LONG TERM GOAL #1   Title Pt. will improve FOTO score by 2 points for clinically significant improvements during ADLs, and IADLs,    Baseline 10th Visit: FOTO score 42 Eval: FOTO score: 41    Time 12    Period Weeks    Status On-going    Target Date 03/24/21      OT LONG TERM GOAL #2   Title Pt. will improve right shoulder flexion by 10 degrees to assist caregivers with UE dressing    Baseline 10th visit: ROM  R 65 scaption(95) Attempts for Active flexion result in scaption. Eval: R:  62(94), L: 114(120    Time 12    Period Weeks    Status On-going    Target Date 03/24/21      OT LONG TERM GOAL #3   Title Pt. will improve right Abduction by 10 degrees to assist caregivers with underarm hygiene care.    Baseline 10th visit: 86(95) Eval: R: 76(93)    Time 12    Period Weeks    Status On-going    Target Date 03/24/21      OT LONG TERM GOAL #4   Title Pt. will demonstrate visual compensatory strategies for navigating through his environment.    Baseline 10th visit: continues to present with limited vision. Eval: Limited    Time 12    Period Weeks    Status On-going    Target Date 03/24/21      OT LONG TERM GOAL #5   Title Pt. will demonstrate visual compensatory strategies for tabletop tasks within his near space.    Baseline 10th visit: Pt. continues to present with limited vision. Eval: Pt. has difficulty    Time 12    Period Weeks    Target Date 03/24/21      OT LONG TERM GOAL #6   Title Pt. will perform hand to mouth patterns with min A in preparation for self-feeding.    Baseline 10th visit: Pt. is able to perfrom hand to mouth patterns, however reports weakness in his hand  limits a secure grasp on the utensi Pt. requires assist to perform. Eval: Pt. has difficulty    Time 12    Period Weeks    Status On-going    Target Date 03/24/21      OT LONG TERM GOAL #7   Title Pt. will perform one step self-grooming tasks with minA.    Baseline 10th visit: Pt. has difficulty sustaining the UEs in elevation for the duration required to perfrom self-grooming tasks thoroughly.Eval: pt. has difficulty    Time 12    Period Weeks    Status On-going    Target Date 03/24/21                   Plan - 02/10/21 1324     Clinical Impression Statement Pt. presents with no reports of eye irritation. Pt. required repositioning multiple times for pressure relief. Pt. continues to tolerate Estim well to the right, and left wrist, and digit extensors with manual  assist. Pt. continues to require assist with self-feeding, and self-grooming tasks. Patient continues to work on normalizing tone in the bilateral upper extremities in order to improve  range of motion, and facilitate active range of motion in order to increase engagement in ADLs and IADL tasks.         OT Occupational Profile and History Detailed Assessment- Review of Records and additional review of physical, cognitive, psychosocial history related to current functional performance    Occupational performance deficits (Please refer to evaluation for details): ADL's;IADL's    Body Structure / Function / Physical Skills ADL;FMC;ROM;Dexterity;IADL;Endurance;Obesity;Strength;UE functional use    Rehab Potential Good    Clinical Decision Making Multiple treatment options, significant modification of task necessary    Comorbidities Affecting Occupational Performance: Presence of comorbidities impacting occupational performance    Modification or Assistance to Complete Evaluation  Max significant modification of tasks or assist is necessary to complete    OT Frequency 2x / week    OT Duration 12 weeks    OT Treatment/Interventions Self-care/ADL training;Therapeutic exercise;Neuromuscular education;Visual/perceptual remediation/compensation;Patient/family education;Therapeutic activities;DME and/or AE instruction;Passive range of motion;Moist Heat;Electrical Stimulation    Consulted and Agree with Plan of Care Patient             Patient will benefit from skilled therapeutic intervention in order to improve the following deficits and impairments:   Body Structure / Function / Physical Skills: ADL, FMC, ROM, Dexterity, IADL, Endurance, Obesity, Strength, UE functional use       Visit Diagnosis: Muscle weakness (generalized)    Problem List Patient Active Problem List   Diagnosis Date Noted   Sepsis due to vancomycin resistant Enterococcus species (HCC) 06/06/2019   SIRS (systemic  inflammatory response syndrome) (HCC) 06/05/2019   Acute lower UTI 06/05/2019   VRE (vancomycin-resistant Enterococci) infection 06/05/2019   Anemia 06/05/2019   Skin ulcer of sacrum with necrosis of muscle (HCC)    Urinary retention    Type 2 diabetes mellitus without complication, with long-term current use of insulin (HCC)    Tachycardia    Lower extremity edema    Acute metabolic encephalopathy    Obstructive sleep apnea    Morbid obesity with BMI of 60.0-69.9, adult (HCC)    Goals of care, counseling/discussion    Palliative care encounter    Sepsis (HCC) 04/27/2019   H/O insulin dependent diabetes mellitus 04/27/2019   History of CVA with residual deficit 04/27/2019   Seizure disorder (HCC) 04/27/2019   Decubitus ulcer of sacral region, stage 4 (HCC) 04/27/2019    Olegario Messier, MS, OTR/L 02/10/2021, 1:30 PM  [place] [facility] MAIN [facility] SERVICES [address] [place], [place], [address] Phone: [phone]   Fax:  [phone]  Name: Paul Hall MRN: [id number] Date of Birth: 1995-04-20

## 2021-02-15 ENCOUNTER — Encounter: Payer: Self-pay | Admitting: Physical Therapy

## 2021-02-15 ENCOUNTER — Other Ambulatory Visit: Payer: Self-pay

## 2021-02-15 ENCOUNTER — Ambulatory Visit: Payer: BC Managed Care – PPO

## 2021-02-15 ENCOUNTER — Ambulatory Visit: Payer: BC Managed Care – PPO | Admitting: Physical Therapy

## 2021-02-15 DIAGNOSIS — R278 Other lack of coordination: Secondary | ICD-10-CM | POA: Diagnosis not present

## 2021-02-15 DIAGNOSIS — R269 Unspecified abnormalities of gait and mobility: Secondary | ICD-10-CM | POA: Diagnosis not present

## 2021-02-15 DIAGNOSIS — M6281 Muscle weakness (generalized): Secondary | ICD-10-CM | POA: Diagnosis not present

## 2021-02-15 DIAGNOSIS — R41841 Cognitive communication deficit: Secondary | ICD-10-CM | POA: Diagnosis not present

## 2021-02-15 DIAGNOSIS — R262 Difficulty in walking, not elsewhere classified: Secondary | ICD-10-CM | POA: Diagnosis not present

## 2021-02-15 DIAGNOSIS — R2681 Unsteadiness on feet: Secondary | ICD-10-CM | POA: Diagnosis not present

## 2021-02-15 DIAGNOSIS — R2689 Other abnormalities of gait and mobility: Secondary | ICD-10-CM | POA: Diagnosis not present

## 2021-02-15 DIAGNOSIS — I6930 Unspecified sequelae of cerebral infarction: Secondary | ICD-10-CM | POA: Diagnosis not present

## 2021-02-15 NOTE — Therapy (Signed)
[place] [facility] MAIN [facility] SERVICES [address] [place], [place], [address] Phone: [phone]   Fax:  [phone]  Occupational Therapy Treatment  Patient Details  Name: Shrey Boike MRN: [id number] Date of Birth: 1995/10/06 Referring Provider (OT): Lenise Herald   Encounter Date: 02/15/2021   OT End of Session - 02/15/21 1545     Visit Number 15    Number of Visits 24    Date for OT Re-Evaluation 03/24/21    Authorization Type Progress report period starting 12/15/2020    OT Start Time 1306    OT Stop Time 1347    OT Time Calculation (min) 41 min    Equipment Utilized During Treatment tilt in space power wc    Activity Tolerance Patient tolerated treatment well    Behavior During Therapy WFL for tasks assessed/performed             Past Medical History:  Diagnosis Date   Diabetes mellitus (HCC)    Hypertension    Stroke [facility])     Past Surgical History:  Procedure Laterality Date   IVC FILTER PLACEMENT ([facility] HX)     LEG SURGERY     PEG TUBE PLACEMENT     TRACHEOSTOMY      There were no vitals filed for this visit.   Subjective Assessment - 02/15/21 1543     Subjective  "I'm tired today."    Patient is accompanied by: Family member    Pertinent History Pt. is a 25 y.o. male who was hospitalized, and diagnosed with COVID-19, sustained a CVA with resultant quadreplegia in 01/2019. Pt. was hospitalized with VRE  UTI. Pt. PMHx includes: urinary retention, Seizure Disorder, Obstructive Sleep Disorder, DM Type II, and Morbid obesity. Pt. resides at home with his parents.    Limitations BUE, and LE limitations with ROM, strength, motor control, and [facility] skills.    Patient Stated Goals To improve his arms, and hands.    Currently in Pain? Yes    Pain Score 5     Pain Location Sacrum    Pain Orientation Mid    Pain Descriptors / Indicators Pressure;Tender    Pain Type Chronic pain    Pain Onset More than a month ago     Pain Frequency Intermittent    Aggravating Factors  prolonged positioning    Effect of Pain on Daily Activities frequent repositional changes needed    Multiple Pain Sites No    Pain Onset More than a month ago            Occupational Therapy Treatment: Electrical Stimulation: Pt. tolerated neuromuscular estim for R hand 8 min., cycle time 10/10,  duty cycle 50%. Applied to the right wrist, and digit extensors, followed by 5 min to the L hand. Applied to the bilateral wrist, and digit extensors with the intensity set to 38, and constant monitoring 100% of the time. Manual assist was required for wrist and digit extension with each rep to hold the position.  Pt was cued to alternate squeezing hand at rest, then straighten wrist and fingers with ramp up to prepare for grasp/release of ADL supplies.   Therapeutic Exercise: Performed passive stretching to bilat shoulders for flex/abd/ER/horiz abd/add, elbow ext, forearm pron/sup, and wrist and digit extension, working to reduce joint stiffness and decrease contracture risk in BUEs.  Performed AAROM for bilat shoulder flex, bilat elbow flex/ext, and ER reaching behind head x10 each for increasing BUE strength for self care.  Response to Treatment: Pt's dad reports he can tell BUEs are becoming more flexible.  Dad reports pt is starting to adjust his face mask on his own, adjust his glasses, scratch his head.  Pt will continue to benefit from skilled OT for increasing BUE flexibility, strength, and coordination to increase indep with self care tasks.     OT Education - 02/15/21 1545     Education Details BUE functioning/postioning    Person(s) Educated Patient    Methods Explanation    Comprehension Verbalized understanding;Verbal cues required;Need further instruction                 OT Long Term Goals - 01/20/21 1311       OT LONG TERM GOAL #1   Title Pt. will improve FOTO score by 2 points for clinically significant  improvements during ADLs, and IADLs,    Baseline 10th Visit: FOTO score 42 Eval: FOTO score: 41    Time 12    Period Weeks    Status On-going    Target Date 03/24/21      OT LONG TERM GOAL #2   Title Pt. will improve right shoulder flexion by 10 degrees to assist caregivers with UE dressing    Baseline 10th visit: ROM  R 65 scaption(95) Attempts for Active flexion result in scaption. Eval: R: 62(94), L: 114(120    Time 12    Period Weeks    Status On-going    Target Date 03/24/21      OT LONG TERM GOAL #3   Title Pt. will improve right Abduction by 10 degrees to assist caregivers with underarm hygiene care.    Baseline 10th visit: 86(95) Eval: R: 76(93)    Time 12    Period Weeks    Status On-going    Target Date 03/24/21      OT LONG TERM GOAL #4   Title Pt. will demonstrate visual compensatory strategies for navigating through his environment.    Baseline 10th visit: continues to present with limited vision. Eval: Limited    Time 12    Period Weeks    Status On-going    Target Date 03/24/21      OT LONG TERM GOAL #5   Title Pt. will demonstrate visual compensatory strategies for tabletop tasks within his near space.    Baseline 10th visit: Pt. continues to present with limited vision. Eval: Pt. has difficulty    Time 12    Period Weeks    Target Date 03/24/21      OT LONG TERM GOAL #6   Title Pt. will perform hand to mouth patterns with min A in preparation for self-feeding.    Baseline 10th visit: Pt. is able to perfrom hand to mouth patterns, however reports weakness in his hand  limits a secure grasp on the utensi Pt. requires assist to perform. Eval: Pt. has difficulty    Time 12    Period Weeks    Status On-going    Target Date 03/24/21      OT LONG TERM GOAL #7   Title Pt. will perform one step self-grooming tasks with minA.    Baseline 10th visit: Pt. has difficulty sustaining the UEs in elevation for the duration required to perfrom self-grooming tasks  thoroughly.Eval: pt. has difficulty    Time 12    Period Weeks    Status On-going    Target Date 03/24/21  Plan - 02/15/21 1555     Clinical Impression Statement Pt's dad reports he can tell BUEs are becoming more flexible.  Dad reports pt is starting to adjust his face mask on his own, adjust his glasses, scratch his head.  Pt will continue to benefit from skilled OT for increasing BUE flexibility, strength, and coordination to increase indep with self care tasks.    OT Occupational Profile and History Detailed Assessment- Review of Records and additional review of physical, cognitive, psychosocial history related to current functional performance    Occupational performance deficits (Please refer to evaluation for details): ADL's;IADL's    Body Structure / Function / Physical Skills ADL;FMC;ROM;Dexterity;IADL;Endurance;Obesity;Strength;UE functional use    Rehab Potential Good    Clinical Decision Making Multiple treatment options, significant modification of task necessary    Comorbidities Affecting Occupational Performance: Presence of comorbidities impacting occupational performance    Modification or Assistance to Complete Evaluation  Max significant modification of tasks or assist is necessary to complete    OT Frequency 2x / week    OT Duration 12 weeks    OT Treatment/Interventions Self-care/ADL training;Therapeutic exercise;Neuromuscular education;Visual/perceptual remediation/compensation;Patient/family education;Therapeutic activities;DME and/or AE instruction;Passive range of motion;Moist Heat;Electrical Stimulation    Consulted and Agree with Plan of Care Patient             Patient will benefit from skilled therapeutic intervention in order to improve the following deficits and impairments:   Body Structure / Function / Physical Skills: ADL, FMC, ROM, Dexterity, IADL, Endurance, Obesity, Strength, UE functional use       Visit Diagnosis: Muscle  weakness (generalized)  Other lack of coordination    Problem List Patient Active Problem List   Diagnosis Date Noted   Sepsis due to vancomycin resistant Enterococcus species (HCC) 06/06/2019   SIRS (systemic inflammatory response syndrome) (HCC) 06/05/2019   Acute lower UTI 06/05/2019   VRE (vancomycin-resistant Enterococci) infection 06/05/2019   Anemia 06/05/2019   Skin ulcer of sacrum with necrosis of muscle (HCC)    Urinary retention    Type 2 diabetes mellitus without complication, with long-term current use of insulin (HCC)    Tachycardia    Lower extremity edema    Acute metabolic encephalopathy    Obstructive sleep apnea    Morbid obesity with BMI of 60.0-69.9, adult (HCC)    Goals of care, counseling/discussion    Palliative care encounter    Sepsis (HCC) 04/27/2019   H/O insulin dependent diabetes mellitus 04/27/2019   History of CVA with residual deficit 04/27/2019   Seizure disorder (HCC) 04/27/2019   Decubitus ulcer of sacral region, stage 4 (HCC) 04/27/2019   Danelle Earthly, MS, OTR/L  Otis Dials, OT/L 02/15/2021, 3:55 PM  [facility] MAIN [facility] SERVICES [address] [place], [place], [address] Phone: [phone]   Fax:  [phone]  Name: Jarmel Linhardt MRN: [id number] Date of Birth: 04-Jun-1995

## 2021-02-15 NOTE — Therapy (Addendum)
[place] MAIN [place] SERVICES [address] [place], [place], [address] Phone: [phone]   Fax:  [phone]  Physical Therapy Treatment  Patient Details  Name: [place] MRN: [id number] Date of Birth: Jun 08, 1995 Referring Provider (PT): [place] Herald, PA-C   Encounter Date: 02/15/2021   PT End of Session - 02/15/21 1710     Visit Number 7    Number of Visits 25    Date for PT Re-Evaluation 03/29/21    Authorization Type [employer] and [employer]- Need auth past 12th visit    Authorization - Visit Number 5    Authorization - Number of Visits 12    Progress Note Due on Visit 10    PT Start Time 1151    PT Stop Time 1233    PT Time Calculation (min) 42 min    Equipment Utilized During Treatment Other (comment)   Hoyer lift   Activity Tolerance Patient tolerated treatment well    Behavior During Therapy WFL for tasks assessed/performed             Past Medical History:  Diagnosis Date   Diabetes mellitus (HCC)    Hypertension    Stroke [place])     Past Surgical History:  Procedure Laterality Date   IVC FILTER PLACEMENT ([place])     LEG SURGERY     PEG TUBE PLACEMENT     TRACHEOSTOMY      There were no vitals filed for this visit.   Subjective Assessment - 02/15/21 1708     Subjective Patient presents with father. Has been compliant with HEP. Pt father showed videa from mother showing pt's improved LE function.    Patient is accompained by: Family member   Mom- [place]; Dad= [place]   Pertinent History Patient is a 25 y.o. male with insulin dependent diabetes and morbid obesity (BMI 65) who was admitted with ARDS in the setting of Covid-19 On10/18/2020 History taken per admission note dated 01/20/2019. On 11/13, the patient was noticed to have RUE twitching. A noncontrast head CT was done and discovered an ischemic stroke and a subdural hemorrhage with slight midline shift. ICP management was initiated by keeping  Na > 145 (on 247ml/hr normal saline) and head of the bed > 90 degrees. Neurosurgery was consulted and does not recommend surgical decompression at this time. His heparin gtt that was being used to treat his bilateral DVTs was stopped and vascular surgery was consulted to place and IVC filter on 11/14. On 11/15 he was transferred back to the MICU after his CVAs did not worsen or evolve. He was not able to be weaned from the vent and underwent trach placement on 11/18 and G-tube placement on 11/19.    On 11/26 patient was noted to have "inward eye deviation" and "downward eye deviation with occasional twitch in the left eye" per nursing, concerning for seizure-like activity. He was restarted on cvEEG today and continued to Keppra 1 g BID. CT head wo contrast was obtained which showed new left frontal IPH with 1 cm midline shift and subfalcine herniation, and prior large left hemispheric infarct. . The patient's neurological exam has remained stable with brainstem reflexes intact, but functionally quadriplegic. with inward/downward eye deviation on 11/26 while on maintenance Keppra. There is no pathological intracranial focality in epilepsy that would make the patient's gaze present in such a manner. There was mild dysconjugate gaze with the right eye out, however, this was not fixed, there was no  rhythmic, stereotyped movements to suggest seizures. cvEEG monitoring 11/27-11/28 revealed no seizures only focal slowing consistent with his known vascular insult. There was a push button event for extremity shaking that had no ccvEEG correlate.  - Keppra 2 g BID.     Subacute ischemic stroke (left, ACA/MCA/PCA distribution)  Likely secondary to COVID hypercoagulable state. Carotid Dopplers negative. Stroke likely subacute given 3 serial CTs demonstrated no evolution of stroke size. Will get statin once CK/rhabdomyolysis and abnl LFTs resolve. 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     Limitations Lifting;Standing;Walking;Writing;Reading;Sitting;House hold activities    How long can you sit comfortably? Patient reports uable to sit up long < 4 hours due to buttock discomfort    How long can you stand comfortably? Patient is currently unable to stand    How long can you walk comfortably? Patient is currently unable to walk    Diagnostic tests FINDINGS:  Brain: Left cerebral convexity cortical hypodensity with associated  cortical calcification extending from the frontal to the parietal  lobe. Extensive area of volume loss and low-density compatible with  chronic infarction. This is noted on prior reports from [place].  Probable chronic watershed infarct. Additional small areas of  hypodensity in the right frontal white matter consistent with  chronic ischemia.     Negative for acute infarct. No acute hemorrhage or mass. Ventricle  size normal without midline shift.     Vascular: Normal arterial flow voids.     Skull: Negative     Sinuses/Orbits: Mucosal edema paranasal sinuses with air-fluid level  left sphenoid sinus. Negative orbit     Other: None     IMPRESSION:  Extensive watershed type infarct over the left cerebral convexity  with cortical calcification. This appears chronic. No associated  hemorrhage, hydrocephalus, or midline shift.     These results were called by telephone at the time of interpretation  on 05/09/2019 at 3:12 pm to provider Alford Highland , who verbally  acknowledged these results.        Electronically Signed    By: [place] M.D.    On: 05/09/2019 15:13       Result History    CT HEAD WO CONTRAST (Order #[id number]) on 05/09/2019 - Order Result History Report    Patient Stated Goals I want to be able to possibly walk again    Pain Onset More than a month ago    Pain Onset More than a month ago            Transfer from Power wheelchair to treatment table utilizing hoyer lift and 3 x assist with 1 staff and 2 SPT.      Seated with pillow behind head on  bariatric lift table with back inclined and foot rest down. Pt felt as if he was sliding so foot rest was elevated following SAQ exercise SAQ x 10 ea LE, good efficacy with this task  -A-P weight shift 10x with CGA -lateral weight shift 5x each side, PT on either side of patient providing tactile cueing for range of lean;  -forward and cross body punches modified with upright posture without support 2 x 5 on ea side each ; very challenging for RUE - no A for for R UE movement this date indicating improved strength  -"penguin" - lateral weight shifting with increased lateral lean and light resistance when returning to midline, mod A provided by PT for back support during this exercise ; 2x5 reps  each side.  -back support from PT to prevent post lean   LAQ x10 BLE Hip abduction with TC fom PT to indicate how far to move LE, x 5 ea side, increased difficulty with R side and pt unable to move LE through as much ROM.   Hip adduction->abduction x 10 on the left, x 5 on the right -good efficacy with L LE, increased difficulty with right     Had some difficulty with [place] lift on transition back to wheelchair.  Hoyer lift battery seem to have decreased significantly over course of session.  Patient was assisted back to wheelchair in [employer] lift with max assistance x4.                          PT Education - 02/15/21 1709     Education Details Exercise technique and form    Person(s) Educated Patient    Methods Explanation    Comprehension Verbalized understanding;Returned demonstration              PT Short Term Goals - 01/04/21 1505       PT SHORT TERM GOAL #1   Title Pt will be independent with HEP in order to improve strength and balance in order to decrease fall risk and improve function at home and work.    Baseline 01/04/2021= No formal HEP in place.    Time 6    Period Weeks    Status New    Target Date 02/15/21               PT Long Term  Goals - 01/19/21 0951       PT LONG TERM GOAL #1   Title Patient will increase BLE gross strength by 1/2 muscle grade to improve functional strength for improved independence with potential gait, increased standing tolerance and increased ADL ability.    Baseline 01/04/2021- Patient presents with 1/5 to 3-/5 B LE strength with MMT.    Time 12    Period Weeks    Status New    Target Date 03/29/21      PT LONG TERM GOAL #2   Title Patient will tolerate sitting unsupported demonstrating erect sitting posture for 15 minutes with CGA to demonstrate improved back extensor strength and improved sitting tolerance.    Baseline 01/04/2021- Patient confied to sitting in lift chair or electric power chair with back support and unable to sit upright without physical assistance.    Time 12    Period Weeks    Status New    Target Date 03/29/21      PT LONG TERM GOAL #3   Title Patient will demonstrate ability to perform static standing in // bars > 2 min with Max Assist  without loss of balance and fair posture for improved overall strength for pre-gait and transfer activities.    Baseline 01/04/2021= Patient current uanble to stand- Dependent on hoyer or sit to stand lift for transfers.    Time 12    Period Weeks    Status New    Target Date 03/29/21      PT LONG TERM GOAL #4   Title Pt will improve FOTO score by 10 points or more demonstrating improved perceived functional ability    Baseline FOTO 7 on 10/17    Time 12    Period Weeks    Status New    Target Date 03/29/21  Plan - 02/15/21 1711     Clinical Impression Statement Patient continues to demonstrate excellent motivation throughout session.  Patient reports he was a little fatigued today due to lack of sleep last night due to this required several rest breaks between sets of exercises.  Patient showed significant trunk stability in today's session showed improved leg strength and his ability to perform short  arc quad exercise.  Patient also demonstrated improved right upper extremity arm motion today's ability to reach across his body with trunk and core exercises.  Patient will continue to benefit from skilled physical therapy intervention in order to improve his core strength, mobility, as well as to improve his overall quality of life.    Personal Factors and Comorbidities Comorbidity 3+;Time since onset of injury/illness/exacerbation    Comorbidities CVA, diabetes, Seizures    Examination-Activity Limitations Bathing;Bed Mobility;Bend;Caring for Others;Carry;Dressing;Hygiene/Grooming;Lift;Locomotion Level;Reach Overhead;Self Feeding;Sit;Squat;Stairs;Stand;Transfers;Toileting    Examination-Participation Restrictions Cleaning;Community Activity;Driving;Laundry;Medication Management;Meal Prep;Occupation;Personal Finances;Shop;Yard Work;Volunteer    Stability/Clinical Decision Making Evolving/Moderate complexity    Rehab Potential Fair    PT Frequency 2x / week    PT Duration 12 weeks    PT Treatment/Interventions ADLs/Self Care Home Management;Cryotherapy;Electrical Stimulation;Moist Heat;Ultrasound;DME Instruction;Gait training;Stair training;Functional mobility training;Therapeutic exercise;Balance training;Patient/family education;Orthotic Fit/Training;Neuromuscular re-education;Wheelchair mobility training;Manual techniques;Passive range of motion;Dry needling;Energy conservation;Taping;Visual/perceptual remediation/compensation;Joint Manipulations    PT Next Visit Plan Continue with POC incorporating core strength    PT Home Exercise Plan Discussed performing quad sets, gluteal sets, bridgining as able-    Consulted and Agree with Plan of Care Patient;Family member/caregiver    Family Member Consulted Mom- [place] and Dad- [name]             Patient will benefit from skilled therapeutic intervention in order to improve the following deficits and impairments:  Abnormal gait, Decreased activity  tolerance, Decreased balance, Decreased cognition, Cardiopulmonary status limiting activity, Decreased coordination, Decreased endurance, Decreased knowledge of use of DME, Decreased mobility, Decreased range of motion, Decreased safety awareness, Decreased strength, Difficulty walking, Hypomobility, Impaired perceived functional ability, Impaired flexibility, Impaired UE functional use, Impaired vision/preception, Improper body mechanics, Postural dysfunction, Obesity  Visit Diagnosis: Muscle weakness (generalized)  Difficulty in walking, not elsewhere classified  Unsteadiness on feet     Problem List Patient Active Problem List   Diagnosis Date Noted   Sepsis due to vancomycin resistant Enterococcus species (HCC) 06/06/2019   SIRS (systemic inflammatory response syndrome) (HCC) 06/05/2019   Acute lower UTI 06/05/2019   VRE (vancomycin-resistant Enterococci) infection 06/05/2019   Anemia 06/05/2019   Skin ulcer of sacrum with necrosis of muscle (HCC)    Urinary retention    Type 2 diabetes mellitus without complication, with long-term current use of insulin (HCC)    Tachycardia    Lower extremity edema    Acute metabolic encephalopathy    Obstructive sleep apnea    Morbid obesity with BMI of 60.0-69.9, adult (HCC)    Goals of care, counseling/discussion    Palliative care encounter    Sepsis (HCC) 04/27/2019   H/O insulin dependent diabetes mellitus 04/27/2019   History of CVA with residual deficit 04/27/2019   Seizure disorder (HCC) 04/27/2019   Decubitus ulcer of sacral region, stage 4 (HCC) 04/27/2019    Norman Herrlich, PT 02/15/2021, 5:32 PM  [place] [facility] MAIN [facility] SERVICES [address] [place], [place], [address] Phone: [phone]   Fax:  [phone]  Name: [place] MRN: [id number] Date of Birth: Oct 28, 1995

## 2021-02-17 ENCOUNTER — Other Ambulatory Visit: Payer: Self-pay

## 2021-02-17 ENCOUNTER — Encounter: Payer: Self-pay | Admitting: Occupational Therapy

## 2021-02-17 ENCOUNTER — Ambulatory Visit: Payer: BC Managed Care – PPO | Admitting: Occupational Therapy

## 2021-02-17 ENCOUNTER — Ambulatory Visit: Payer: BC Managed Care – PPO | Admitting: Speech Pathology

## 2021-02-17 DIAGNOSIS — R278 Other lack of coordination: Secondary | ICD-10-CM | POA: Diagnosis not present

## 2021-02-17 DIAGNOSIS — M6281 Muscle weakness (generalized): Secondary | ICD-10-CM | POA: Diagnosis not present

## 2021-02-17 DIAGNOSIS — I6930 Unspecified sequelae of cerebral infarction: Secondary | ICD-10-CM

## 2021-02-17 DIAGNOSIS — R2681 Unsteadiness on feet: Secondary | ICD-10-CM | POA: Diagnosis not present

## 2021-02-17 DIAGNOSIS — R2689 Other abnormalities of gait and mobility: Secondary | ICD-10-CM | POA: Diagnosis not present

## 2021-02-17 DIAGNOSIS — R41841 Cognitive communication deficit: Secondary | ICD-10-CM

## 2021-02-17 DIAGNOSIS — R262 Difficulty in walking, not elsewhere classified: Secondary | ICD-10-CM | POA: Diagnosis not present

## 2021-02-17 DIAGNOSIS — R269 Unspecified abnormalities of gait and mobility: Secondary | ICD-10-CM | POA: Diagnosis not present

## 2021-02-17 NOTE — Therapy (Signed)
[place] [facility] MAIN [facility] SERVICES [address] [place], [place], [address] Phone: [phone]   Fax:  [phone]  Occupational Therapy Treatment  Patient Details  Name: Paul Hall MRN: [id number] Date of Birth: 1995-08-02 Referring Provider (OT): Lenise Herald   Encounter Date: 02/17/2021   OT End of Session - 02/17/21 1726     Visit Number 16    Number of Visits 24    Date for OT Re-Evaluation 03/24/21    Authorization Type Progress report period starting 12/15/2020    OT Start Time 1530    OT Stop Time 1615    OT Time Calculation (min) 45 min    Activity Tolerance Patient tolerated treatment well    Behavior During Therapy [facility] for tasks assessed/performed             Past Medical History:  Diagnosis Date   Diabetes mellitus (HCC)    Hypertension    Stroke [facility])     Past Surgical History:  Procedure Laterality Date   IVC FILTER PLACEMENT ([facility] HX)     LEG SURGERY     PEG TUBE PLACEMENT     TRACHEOSTOMY      There were no vitals filed for this visit.   Subjective Assessment - 02/17/21 1725     Subjective  Pt. went to the cafeteria prior to the OT session.    Patient is accompanied by: Family member    Pertinent History Pt. is a 25 y.o. male who was hospitalized, and diagnosed with COVID-19, sustained a CVA with resultant quadreplegia in 01/2019. Pt. was hospitalized with VRE  UTI. Pt. PMHx includes: urinary retention, Seizure Disorder, Obstructive Sleep Disorder, DM Type II, and Morbid obesity. Pt. resides at home with his parents.    Limitations BUE, and LE limitations with ROM, strength, motor control, and [facility] skills.    Currently in Pain? Yes    Pain Score 2     Pain Location Finger (Comment which one)   4th digit   Pain Orientation Right            OT TREATMENT     Estim attended:   Pt. tolerated neuromuscular estim  for 8 min., cycle time 10/10,  duty cycle 50%. Applied to the right wrist, and  digit extensors, followed by  8 min. Applied to the left wrist, and digit extensors with the intensity set to 40, and constant monitoring 100% of the time. Manual assist was required for wrist and digit extension with each rep. To hold the position.    Therapeutic Exercise:   Patient tolerated active range of motion reps for bilateral shoulder flexion, abduction. Passive range of motion for bilateral elbow flexion and extension with slow prolonged gentle stretching for elbow extension, forearm supination, wrist extension, and digit extension. Emphasis was placed on bilateral supination stretches secondary to increased tightness. Patient tolerated moist heat modality to his bilateral hands prior to range of motion.   The session time was adjusted today secondary to pt. Requiring a snack in the cafeteria. Pt. required repositioning for pressure relief. Pt. continues to tolerate Estim well to the right, and left wrist, and digit extensors with manual assist.  Pt. Required increased stretching for bilateral supination secondary to tightness. Patient continues to work on normalizing tone in the bilateral upper extremities in order to improve range of motion, and facilitate active range of motion in order to increase engagement in ADLs and IADL tasks.  OT Education - 02/17/21 1726     Education Details BUE functioning/postioning    Person(s) Educated Patient    Methods Explanation    Comprehension Verbalized understanding;Verbal cues required;Need further instruction                 OT Long Term Goals - 01/20/21 1311       OT LONG TERM GOAL #1   Title Pt. will improve FOTO score by 2 points for clinically significant improvements during ADLs, and IADLs,    Baseline 10th Visit: FOTO score 42 Eval: FOTO score: 41    Time 12    Period Weeks    Status On-going    Target Date 03/24/21      OT LONG TERM GOAL #2   Title Pt. will improve right  shoulder flexion by 10 degrees to assist caregivers with UE dressing    Baseline 10th visit: ROM  R 65 scaption(95) Attempts for Active flexion result in scaption. Eval: R: 62(94), L: 114(120    Time 12    Period Weeks    Status On-going    Target Date 03/24/21      OT LONG TERM GOAL #3   Title Pt. will improve right Abduction by 10 degrees to assist caregivers with underarm hygiene care.    Baseline 10th visit: 86(95) Eval: R: 76(93)    Time 12    Period Weeks    Status On-going    Target Date 03/24/21      OT LONG TERM GOAL #4   Title Pt. will demonstrate visual compensatory strategies for navigating through his environment.    Baseline 10th visit: continues to present with limited vision. Eval: Limited    Time 12    Period Weeks    Status On-going    Target Date 03/24/21      OT LONG TERM GOAL #5   Title Pt. will demonstrate visual compensatory strategies for tabletop tasks within his near space.    Baseline 10th visit: Pt. continues to present with limited vision. Eval: Pt. has difficulty    Time 12    Period Weeks    Target Date 03/24/21      OT LONG TERM GOAL #6   Title Pt. will perform hand to mouth patterns with min A in preparation for self-feeding.    Baseline 10th visit: Pt. is able to perfrom hand to mouth patterns, however reports weakness in his hand  limits a secure grasp on the utensi Pt. requires assist to perform. Eval: Pt. has difficulty    Time 12    Period Weeks    Status On-going    Target Date 03/24/21      OT LONG TERM GOAL #7   Title Pt. will perform one step self-grooming tasks with minA.    Baseline 10th visit: Pt. has difficulty sustaining the UEs in elevation for the duration required to perfrom self-grooming tasks thoroughly.Eval: pt. has difficulty    Time 12    Period Weeks    Status On-going    Target Date 03/24/21                   Plan - 02/17/21 1727     Clinical Impression Statement The session time was adjusted today  secondary to pt. The session time was adjusted today secondary to pt. Requiring a snack in the cafeteria. Pt. required repositioning for pressure relief. Pt. continues to tolerate Estim well to the right, and left wrist, and digit  extensors with manual assist.  Pt. Required increased stretching for bilateral supination secondary to tightness. Patient continues to work on normalizing tone in the bilateral upper extremities in order to improve range of motion, and facilitate active range of motion in order to increase engagement in ADLs and IADL tasks.   OT Occupational Profile and History Detailed Assessment- Review of Records and additional review of physical, cognitive, psychosocial history related to current functional performance    Occupational performance deficits (Please refer to evaluation for details): ADL's;IADL's    Body Structure / Function / Physical Skills ADL;FMC;ROM;Dexterity;IADL;Endurance;Obesity;Strength;UE functional use    Rehab Potential Good    Clinical Decision Making Multiple treatment options, significant modification of task necessary    Comorbidities Affecting Occupational Performance: Presence of comorbidities impacting occupational performance    Modification or Assistance to Complete Evaluation  Max significant modification of tasks or assist is necessary to complete    OT Frequency 2x / week    OT Duration 12 weeks    OT Treatment/Interventions Self-care/ADL training;Therapeutic exercise;Neuromuscular education;Visual/perceptual remediation/compensation;Patient/family education;Therapeutic activities;DME and/or AE instruction;Passive range of motion;Moist Heat;Electrical Stimulation    Consulted and Agree with Plan of Care Patient             Patient will benefit from skilled therapeutic intervention in order to improve the following deficits and impairments:   Body Structure / Function / Physical Skills: ADL, FMC, ROM, Dexterity, IADL, Endurance, Obesity, Strength,  UE functional use       Visit Diagnosis: Muscle weakness (generalized)    Problem List Patient Active Problem List   Diagnosis Date Noted   Sepsis due to vancomycin resistant Enterococcus species (HCC) 06/06/2019   SIRS (systemic inflammatory response syndrome) (HCC) 06/05/2019   Acute lower UTI 06/05/2019   VRE (vancomycin-resistant Enterococci) infection 06/05/2019   Anemia 06/05/2019   Skin ulcer of sacrum with necrosis of muscle (HCC)    Urinary retention    Type 2 diabetes mellitus without complication, with long-term current use of insulin (HCC)    Tachycardia    Lower extremity edema    Acute metabolic encephalopathy    Obstructive sleep apnea    Morbid obesity with BMI of 60.0-69.9, adult (HCC)    Goals of care, counseling/discussion    Palliative care encounter    Sepsis (HCC) 04/27/2019   H/O insulin dependent diabetes mellitus 04/27/2019   History of CVA with residual deficit 04/27/2019   Seizure disorder (HCC) 04/27/2019   Decubitus ulcer of sacral region, stage 4 (HCC) 04/27/2019    Olegario Messier, MS, OTR/L 02/17/2021, 5:29 PM  [place] [facility] MAIN [facility] SERVICES [address] [place], [place], [address] Phone: [phone]   Fax:  [phone]  Name: Paul Hall MRN: [id number] Date of Birth: [birth year]

## 2021-02-18 NOTE — Patient Instructions (Signed)
Start listening to audio books each day

## 2021-02-18 NOTE — Therapy (Signed)
[place] [facility] MAIN [facility] SERVICES [address] [place], [place], [address] Phone: [phone]   Fax:  [phone]  Speech Language Pathology Treatment  Patient Details  Name: Paul Hall MRN: [id number] Date of Birth: [birth year] Referring Provider (SLP): Lenise Herald   Encounter Date: 02/17/2021   End of Session - 02/18/21 1759     Visit Number 15    Number of Visits 25    Date for SLP Re-Evaluation 03/09/21    Authorization Type [employer] [place] COMM PPO    Authorization Time Period 12/15/2020 thru 16/09/2020    Authorization - Visit Number 5    Progress Note Due on Visit 10    SLP Start Time 1400    SLP Stop Time  1500    SLP Time Calculation (min) 60 min    Activity Tolerance Patient tolerated treatment well             Past Medical History:  Diagnosis Date   Diabetes mellitus (HCC)    Hypertension    Stroke [facility])     Past Surgical History:  Procedure Laterality Date   IVC FILTER PLACEMENT ([facility] HX)     LEG SURGERY     PEG TUBE PLACEMENT     TRACHEOSTOMY      There were no vitals filed for this visit.   Subjective Assessment - 02/18/21 1752     Subjective pt pleasant, father present for session    Patient is accompained by: Family member    Currently in Pain? No/denies                   ADULT SLP TREATMENT - 02/18/21 0001       Treatment Provided   Treatment provided Cognitive-Linquistic      Cognitive-Linquistic Treatment   Treatment focused on Cognition;Patient/family/caregiver education    Skilled Treatment COGNITIVE COMMUNICATION: skilled treatment session focused on pt's functional memory, attention and verbal communication (specifically organization of verbal message) as well as education on ST within rehab setting. Pt was independent in verbal organization of information, great speech intelligibility with independent recall of previous situation.              SLP  Education - 02/18/21 1758     Education Details great progress towards goals, scope of ST services as provided by rehab services    Person(s) Educated Patient;Parent(s)    Methods Explanation;Demonstration;Verbal cues    Comprehension Verbalized understanding;Returned demonstration              SLP Short Term Goals - 02/18/21 1801       SLP SHORT TERM GOAL #1   Title Using a good quality loud voice, pt will communicate his wants/needs/concerns to rehab staff.    Status Achieved      SLP SHORT TERM GOAL #2   Title Pt will recall 5 or more items (medication list) after a 15 minute delay given moderate cues in order to increase independent during functional memory tasks.    Status Achieved      SLP SHORT TERM GOAL #3   Title Pt will demonstrate working memory by following instructions in multi-discpline therapy sessions.    Status Achieved      SLP SHORT TERM GOAL #4   Title Pt will demonstate mental manipulation by stating his therpay schedule for the day.    Status Achieved              SLP Long Term Goals -  02/18/21 1804       SLP LONG TERM GOAL #1   Title Using a good quality voice, pt will communicate his wants/needs with staff to increase his independence to perform activities.    Time 12    Period Weeks    Status Achieved    Target Date 03/09/21      SLP LONG TERM GOAL #2   Title Pt will comprehend his list of medications in order to recall 6 medications with minimal assistance.    Status Achieved      SLP LONG TERM GOAL #3   Title Pt will verbally generate solutions to problems encountered in everyday living tasks with minimal assistance.    Status Achieved              Plan - 02/18/21 1800     Clinical Impression Statement Pt and his father continue to report that pt's cognitive abilities are functional within the tasks that pt completes each day. Father reports that pt's verbal communication continues to be improved when conveying information. Pt  and his father didn't notice any areas of cognitive communicaiton deficits during functional tasks since last treatment session. Pt has gained increased independence in experiencing his wants /needs to therapiests and has exhibited decreased relaince on his father to communicate desires. Given pt's progress towards his goals, will work towards developing HEP and begin conversation towards functional mastery of goals.    Speech Therapy Frequency 2x / week    Duration 12 weeks    Treatment/Interventions Language facilitation;Cueing hierarchy;Internal/external aids;Functional tasks;SLP instruction and feedback;Patient/family education;Cognitive reorganization;Compensatory strategies    Potential to Achieve Goals Good    Consulted and Agree with Plan of Care Patient;Family member/caregiver    Family Member Consulted pt's dad             Patient will benefit from skilled therapeutic intervention in order to improve the following deficits and impairments:   Cognitive communication deficit  History of CVA with residual deficit    Problem List Patient Active Problem List   Diagnosis Date Noted   Sepsis due to vancomycin resistant Enterococcus species (HCC) 06/06/2019   SIRS (systemic inflammatory response syndrome) (HCC) 06/05/2019   Acute lower UTI 06/05/2019   VRE (vancomycin-resistant Enterococci) infection 06/05/2019   Anemia 06/05/2019   Skin ulcer of sacrum with necrosis of muscle (HCC)    Urinary retention    Type 2 diabetes mellitus without complication, with long-term current use of insulin (HCC)    Tachycardia    Lower extremity edema    Acute metabolic encephalopathy    Obstructive sleep apnea    Morbid obesity with BMI of 60.0-69.9, adult (HCC)    Goals of care, counseling/discussion    Palliative care encounter    Sepsis (HCC) 04/27/2019   H/O insulin dependent diabetes mellitus 04/27/2019   History of CVA with residual deficit 04/27/2019   Seizure disorder (HCC)  04/27/2019   Decubitus ulcer of sacral region, stage 4 (HCC) 04/27/2019   Quayshawn Nin B. Dreama Saa M.S., CCC-SLP, [facility] Speech-Language Pathologist Rehabilitation Services Office [phone]  Paul Hall 02/18/2021, 6:06 PM  [place] [facility] MAIN [facility] SERVICES [address] [place], [place], [address] Phone: [phone]   Fax:  [phone]   Name: Paul Hall MRN: [id number] Date of Birth: 1995-10-14

## 2021-02-21 DIAGNOSIS — G4733 Obstructive sleep apnea (adult) (pediatric): Secondary | ICD-10-CM | POA: Diagnosis not present

## 2021-02-24 ENCOUNTER — Ambulatory Visit: Payer: BC Managed Care – PPO

## 2021-02-24 ENCOUNTER — Ambulatory Visit: Payer: BC Managed Care – PPO | Admitting: Occupational Therapy

## 2021-02-24 ENCOUNTER — Ambulatory Visit: Payer: BC Managed Care – PPO | Admitting: Speech Pathology

## 2021-03-01 ENCOUNTER — Ambulatory Visit: Payer: BC Managed Care – PPO | Admitting: Occupational Therapy

## 2021-03-01 ENCOUNTER — Ambulatory Visit: Payer: BC Managed Care – PPO | Admitting: Speech Pathology

## 2021-03-01 DIAGNOSIS — G4733 Obstructive sleep apnea (adult) (pediatric): Secondary | ICD-10-CM | POA: Diagnosis not present

## 2021-03-01 DIAGNOSIS — L8994 Pressure ulcer of unspecified site, stage 4: Secondary | ICD-10-CM | POA: Diagnosis not present

## 2021-03-01 DIAGNOSIS — I6389 Other cerebral infarction: Secondary | ICD-10-CM | POA: Diagnosis not present

## 2021-03-03 ENCOUNTER — Other Ambulatory Visit: Payer: Self-pay

## 2021-03-03 ENCOUNTER — Ambulatory Visit: Payer: BC Managed Care – PPO | Admitting: Speech Pathology

## 2021-03-03 ENCOUNTER — Ambulatory Visit: Payer: BC Managed Care – PPO

## 2021-03-03 DIAGNOSIS — I6930 Unspecified sequelae of cerebral infarction: Secondary | ICD-10-CM | POA: Diagnosis not present

## 2021-03-03 DIAGNOSIS — R41841 Cognitive communication deficit: Secondary | ICD-10-CM | POA: Diagnosis not present

## 2021-03-03 DIAGNOSIS — M6281 Muscle weakness (generalized): Secondary | ICD-10-CM | POA: Diagnosis not present

## 2021-03-03 DIAGNOSIS — R278 Other lack of coordination: Secondary | ICD-10-CM | POA: Diagnosis not present

## 2021-03-03 DIAGNOSIS — R2681 Unsteadiness on feet: Secondary | ICD-10-CM | POA: Diagnosis not present

## 2021-03-03 DIAGNOSIS — R262 Difficulty in walking, not elsewhere classified: Secondary | ICD-10-CM | POA: Diagnosis not present

## 2021-03-03 DIAGNOSIS — R2689 Other abnormalities of gait and mobility: Secondary | ICD-10-CM | POA: Diagnosis not present

## 2021-03-03 DIAGNOSIS — R269 Unspecified abnormalities of gait and mobility: Secondary | ICD-10-CM | POA: Diagnosis not present

## 2021-03-03 NOTE — Patient Instructions (Signed)
Use audiobooks to help foster continued use of memory

## 2021-03-03 NOTE — Therapy (Signed)
[place] [facility] MAIN [facility] SERVICES [address] [place], [place], [address] Phone: [phone]   Fax:  [phone]  Speech Language Pathology Treatment DISCHARGE SUMMARY  Patient Details  Name: Paul Hall MRN: [id number] Date of Birth: May 29, 1995 Referring Provider (SLP): Lenise Herald   Encounter Date: 03/03/2021   End of Session - 03/03/21 2302     Visit Number 16    Number of Visits 25    Date for SLP Re-Evaluation 03/09/21    Authorization Type [employer] [place] COMM PPO    Authorization Time Period 12/15/2020 thru 16/09/2020    Authorization - Visit Number 6    Progress Note Due on Visit 10    SLP Start Time 1400    SLP Stop Time  1500    SLP Time Calculation (min) 60 min    Activity Tolerance Patient tolerated treatment well             Past Medical History:  Diagnosis Date   Diabetes mellitus (HCC)    Hypertension    Stroke [facility])     Past Surgical History:  Procedure Laterality Date   IVC FILTER PLACEMENT ([facility] HX)     LEG SURGERY     PEG TUBE PLACEMENT     TRACHEOSTOMY      There were no vitals filed for this visit.   Subjective Assessment - 03/03/21 2259     Subjective pt pleasant, father present for session    Patient is accompained by: Family member    Currently in Pain? No/denies                   ADULT SLP TREATMENT - 03/03/21 0001       Treatment Provided   Treatment provided Cognitive-Linquistic      Cognitive-Linquistic Treatment   Treatment focused on Cognition;Patient/family/caregiver education    Skilled Treatment Portions of the Multifactorial Memory Questionairre were read to pt. Pt's responses demonstrated appropriate use of memory strategies, few if any memory mistakes and pt indicates that he feels positive about his overal memory abilities.              SLP Education - 03/03/21 2301     Education Details how to re-initiate ST services when appropriate     Person(s) Educated Patient;Parent(s)    Methods Explanation;Demonstration;Verbal cues    Comprehension Returned demonstration;Verbalized understanding              SLP Short Term Goals - 02/18/21 1801       SLP SHORT TERM GOAL #1   Title Using a good quality loud voice, pt will communicate his wants/needs/concerns to rehab staff.    Status Achieved      SLP SHORT TERM GOAL #2   Title Pt will recall 5 or more items (medication list) after a 15 minute delay given moderate cues in order to increase independent during functional memory tasks.    Status Achieved      SLP SHORT TERM GOAL #3   Title Pt will demonstrate working memory by following instructions in multi-discpline therapy sessions.    Status Achieved      SLP SHORT TERM GOAL #4   Title Pt will demonstate mental manipulation by stating his therpay schedule for the day.    Status Achieved              SLP Long Term Goals - 02/18/21 1804       SLP LONG TERM GOAL #1  Title Using a good quality voice, pt will communicate his wants/needs with staff to increase his independence to perform activities.    Time 12    Period Weeks    Status Achieved    Target Date 03/09/21      SLP LONG TERM GOAL #2   Title Pt will comprehend his list of medications in order to recall 6 medications with minimal assistance.    Status Achieved      SLP LONG TERM GOAL #3   Title Pt will verbally generate solutions to problems encountered in everyday living tasks with minimal assistance.    Status Achieved              Plan - 03/03/21 2303     Clinical Impression Statement Pt's cognitive communication abilities continue to be appropriate for promoting functional independence. As pt's current situation progresses, pt may require skilled ST services again in the future to address higher level cognitive function as his ability to perform higher level tasks is improved. All education has been completed with pt and his father. All  questions have been answered to their satisfaction.    Consulted and Agree with Plan of Care Patient;Family member/caregiver    Family Member Consulted pt's dad             Patient will benefit from skilled therapeutic intervention in order to improve the following deficits and impairments:   Cognitive communication deficit  History of CVA with residual deficit    Problem List Patient Active Problem List   Diagnosis Date Noted   Sepsis due to vancomycin resistant Enterococcus species (HCC) 06/06/2019   SIRS (systemic inflammatory response syndrome) (HCC) 06/05/2019   Acute lower UTI 06/05/2019   VRE (vancomycin-resistant Enterococci) infection 06/05/2019   Anemia 06/05/2019   Skin ulcer of sacrum with necrosis of muscle (HCC)    Urinary retention    Type 2 diabetes mellitus without complication, with long-term current use of insulin (HCC)    Tachycardia    Lower extremity edema    Acute metabolic encephalopathy    Obstructive sleep apnea    Morbid obesity with BMI of 60.0-69.9, adult (HCC)    Goals of care, counseling/discussion    Palliative care encounter    Sepsis (HCC) 04/27/2019   H/O insulin dependent diabetes mellitus 04/27/2019   History of CVA with residual deficit 04/27/2019   Seizure disorder (HCC) 04/27/2019   Decubitus ulcer of sacral region, stage 4 (HCC) 04/27/2019   Youa Deloney B. Dreama Saa M.S., CCC-SLP, [facility] Pathologist Rehabilitation Services Office [phone]  Reuel [place], [place] 03/03/2021, 11:06 PM  [place] [facility] MAIN [facility] SERVICES [address] [place], [place], [address] Phone: [phone]   Fax:  [phone]   Name: Paul Hall MRN: [id number] Date of Birth: 07/21/1995

## 2021-03-04 DIAGNOSIS — G4733 Obstructive sleep apnea (adult) (pediatric): Secondary | ICD-10-CM | POA: Diagnosis not present

## 2021-03-04 DIAGNOSIS — L8994 Pressure ulcer of unspecified site, stage 4: Secondary | ICD-10-CM | POA: Diagnosis not present

## 2021-03-04 DIAGNOSIS — I6389 Other cerebral infarction: Secondary | ICD-10-CM | POA: Diagnosis not present

## 2021-03-08 ENCOUNTER — Ambulatory Visit: Payer: BC Managed Care – PPO | Admitting: Occupational Therapy

## 2021-03-08 ENCOUNTER — Other Ambulatory Visit: Payer: Self-pay

## 2021-03-08 ENCOUNTER — Encounter: Payer: Self-pay | Admitting: Occupational Therapy

## 2021-03-08 ENCOUNTER — Ambulatory Visit: Payer: BC Managed Care – PPO | Attending: Physician Assistant

## 2021-03-08 ENCOUNTER — Encounter: Payer: BC Managed Care – PPO | Admitting: Speech Pathology

## 2021-03-08 DIAGNOSIS — R278 Other lack of coordination: Secondary | ICD-10-CM | POA: Insufficient documentation

## 2021-03-08 DIAGNOSIS — R2689 Other abnormalities of gait and mobility: Secondary | ICD-10-CM | POA: Diagnosis not present

## 2021-03-08 DIAGNOSIS — M6281 Muscle weakness (generalized): Secondary | ICD-10-CM

## 2021-03-08 DIAGNOSIS — I6930 Unspecified sequelae of cerebral infarction: Secondary | ICD-10-CM | POA: Diagnosis not present

## 2021-03-08 DIAGNOSIS — R2681 Unsteadiness on feet: Secondary | ICD-10-CM | POA: Diagnosis not present

## 2021-03-08 DIAGNOSIS — R262 Difficulty in walking, not elsewhere classified: Secondary | ICD-10-CM | POA: Diagnosis not present

## 2021-03-08 DIAGNOSIS — R269 Unspecified abnormalities of gait and mobility: Secondary | ICD-10-CM | POA: Insufficient documentation

## 2021-03-08 NOTE — Therapy (Signed)
[facility] MAIN [facility] SERVICES [address] [place], [place], [address] Phone: [phone]   Fax:  [phone]  Physical Therapy Treatment  Patient Details  Name: Paul Hall MRN: [id number] Date of Birth: 30-Dec-1995 Referring Provider (PT): Lenise Herald, PA-C   Encounter Date: 03/08/2021   PT End of Session - 03/08/21 1619     Visit Number 8    Number of Visits 25    Date for PT Re-Evaluation 03/29/21    Authorization Type [employer] and [employer]- Need auth past 12th visit    Authorization - Visit Number 6    Authorization - Number of Visits 12    Progress Note Due on Visit 10    PT Start Time 1521    PT Stop Time 1600    PT Time Calculation (min) 39 min    Equipment Utilized During Treatment Other (comment)   Hoyer lift   Activity Tolerance Patient tolerated treatment well    Behavior During Therapy WFL for tasks assessed/performed             Past Medical History:  Diagnosis Date   Diabetes mellitus (HCC)    Hypertension    Stroke [facility])     Past Surgical History:  Procedure Laterality Date   IVC FILTER PLACEMENT ([facility] HX)     LEG SURGERY     PEG TUBE PLACEMENT     TRACHEOSTOMY      There were no vitals filed for this visit.   Subjective Assessment - 03/08/21 1617     Subjective Patient presents after a three weeks absence. Has been compliant with HEP.    Patient is accompained by: Family member   [name]; [name]   Pertinent History Patient is a 25 y.o. male with insulin dependent diabetes and morbid obesity (BMI 65) who was admitted with ARDS in the setting of Covid-19 On10/18/2020 History taken per admission note dated 01/20/2019. On 11/13, the patient was noticed to have RUE twitching. A noncontrast head CT was done and discovered an ischemic stroke and a subdural hemorrhage with slight midline shift. ICP management was initiated by keeping Na > 145 (on 292ml/hr normal saline) and head of the  bed > 90 degrees. Neurosurgery was consulted and does not recommend surgical decompression at this time. His heparin gtt that was being used to treat his bilateral DVTs was stopped and vascular surgery was consulted to place and IVC filter on 11/14. On 11/15 he was transferred back to the MICU after his CVAs did not worsen or evolve. He was not able to be weaned from the vent and underwent trach placement on 11/18 and G-tube placement on 11/19.    On 11/26 patient was noted to have "inward eye deviation" and "downward eye deviation with occasional twitch in the left eye" per nursing, concerning for seizure-like activity. He was restarted on cvEEG today and continued to Keppra 1 g BID. CT head wo contrast was obtained which showed new left frontal IPH with 1 cm midline shift and subfalcine herniation, and prior large left hemispheric infarct. . The patient's neurological exam has remained stable with brainstem reflexes intact, but functionally quadriplegic. with inward/downward eye deviation on 11/26 while on maintenance Keppra. There is no pathological intracranial focality in epilepsy that would make the patient's gaze present in such a manner. There was mild dysconjugate gaze with the right eye out, however, this was not fixed, there was no rhythmic, stereotyped movements to suggest seizures. cvEEG monitoring  11/27-11/28 revealed no seizures only focal slowing consistent with his known vascular insult. There was a push button event for extremity shaking that had no ccvEEG correlate.  - Keppra 2 g BID.     Subacute ischemic stroke (left, ACA/MCA/PCA distribution)  Likely secondary to COVID hypercoagulable state. Carotid Dopplers negative. Stroke likely subacute given 3 serial CTs demonstrated no evolution of stroke size. Will get statin once CK/rhabdomyolysis and abnl LFTs resolve. 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    Limitations  Lifting;Standing;Walking;Writing;Reading;Sitting;House hold activities    How long can you sit comfortably? Patient reports uable to sit up long < 4 hours due to buttock discomfort    How long can you stand comfortably? Patient is currently unable to stand    How long can you walk comfortably? Patient is currently unable to walk    Diagnostic tests FINDINGS:  Brain: Left cerebral convexity cortical hypodensity with associated  cortical calcification extending from the frontal to the parietal  lobe. Extensive area of volume loss and low-density compatible with  chronic infarction. This is noted on prior reports from [facility]  Probable chronic watershed infarct. Additional small areas of  hypodensity in the right frontal white matter consistent with  chronic ischemia.     Negative for acute infarct. No acute hemorrhage or mass. Ventricle  size normal without midline shift.     Vascular: Normal arterial flow voids.     Skull: Negative     Sinuses/Orbits: Mucosal edema paranasal sinuses with air-fluid level  left sphenoid sinus. Negative orbit     Other: None     IMPRESSION:  Extensive watershed type infarct over the left cerebral convexity  with cortical calcification. This appears chronic. No associated  hemorrhage, hydrocephalus, or midline shift.     These results were called by telephone at the time of interpretation  on 05/09/2019 at 3:12 pm to provider Alford Highland , who verbally  acknowledged these results.        Electronically Signed    By: Marlan Palau M.D.    On: 05/09/2019 15:13       Result History    CT HEAD WO CONTRAST (Order #[id number]) on 05/09/2019 - Order Result History Report    Patient Stated Goals I want to be able to possibly walk again    Currently in Pain? No/denies                Treatment:   In // bars -transitioned to lowest height in chair. One SPT per arm to assist with grip, one PT in front. Unable to reach ground with feet so pressed into foot rests with forward trunk  lean 10x 3-5 second holds   Transfer from Power wheelchair to treatment table utilizing hoyer lift and 3 x assist with 1 staff and 2 SPT.    Chair positioned to lowest setting with 6" step under feet for weight acceptance: -forward trunk lean pressing feet into step 8x 5 second holds with last one being 15 second hold  -lateral weight shift 5x each side, PT on either side of patient providing tactile cueing for range of lean;  -cross body punch with AAROM for RUE performed 5x with focus on trunk twist  -adduction squeeze 10x -static sit upright posture hands on knees feet supported by step 8x 10-20 second holds    Transfer from treatment table to Power wheelchair utilizing hoyer lift and 3 x assist with 1 staff and 2 SPT.  Pt educated throughout session about proper posture and technique with exercises. Improved exercise technique, movement at target joints, use of target muscles after min to mod verbal, visual, tactile cues.   Patient progressing to putting weight through LE's in seated position. He is very motivated throughout session and attempts full effort with every exercise. Use of machinery to attempt partial stand will be beneficial next session, such as a sabina. He shows significant LE strength improvement with ability to recruit his musculature as well as trunk musculature with decreased stabilization required from PT and SPTs. Patient will continue to benefit from skilled physical therapy to improve his core strength, as well as to improve his quality of life.                PT Education - 03/08/21 1618     Education Details exercise technique, body mechanics, weight shift    Person(s) Educated Patient    Methods Explanation;Demonstration;Tactile cues;Verbal cues    Comprehension Verbalized understanding;Returned demonstration;Verbal cues required;Tactile cues required              PT Short Term Goals - 01/04/21 1505       PT SHORT TERM GOAL #1    Title Pt will be independent with HEP in order to improve strength and balance in order to decrease fall risk and improve function at home and work.    Baseline 01/04/2021= No formal HEP in place.    Time 6    Period Weeks    Status New    Target Date 02/15/21               PT Long Term Goals - 01/19/21 0951       PT LONG TERM GOAL #1   Title Patient will increase BLE gross strength by 1/2 muscle grade to improve functional strength for improved independence with potential gait, increased standing tolerance and increased ADL ability.    Baseline 01/04/2021- Patient presents with 1/5 to 3-/5 B LE strength with MMT.    Time 12    Period Weeks    Status New    Target Date 03/29/21      PT LONG TERM GOAL #2   Title Patient will tolerate sitting unsupported demonstrating erect sitting posture for 15 minutes with CGA to demonstrate improved back extensor strength and improved sitting tolerance.    Baseline 01/04/2021- Patient confied to sitting in lift chair or electric power chair with back support and unable to sit upright without physical assistance.    Time 12    Period Weeks    Status New    Target Date 03/29/21      PT LONG TERM GOAL #3   Title Patient will demonstrate ability to perform static standing in // bars > 2 min with Max Assist  without loss of balance and fair posture for improved overall strength for pre-gait and transfer activities.    Baseline 01/04/2021= Patient current uanble to stand- Dependent on hoyer or sit to stand lift for transfers.    Time 12    Period Weeks    Status New    Target Date 03/29/21      PT LONG TERM GOAL #4   Title Pt will improve FOTO score by 10 points or more demonstrating improved perceived functional ability    Baseline FOTO 7 on 10/17    Time 12    Period Weeks    Status New    Target Date 03/29/21  Plan - 03/08/21 1620     Clinical Impression Statement Patient progressing to putting weight through  LE's in seated position. He is very motivated throughout session and attempts full effort with every exercise. Use of machinery to attempt partial stand will be beneficial next session, such as a sabina. He shows significant LE strength improvement with ability to recruit his musculature as well as trunk musculature with decreased stabilization required from PT and SPTs. Patient will continue to benefit from skilled physical therapy to improve his core strength, as well as to improve his quality of life.    Personal Factors and Comorbidities Comorbidity 3+;Time since onset of injury/illness/exacerbation    Comorbidities CVA, diabetes, Seizures    Examination-Activity Limitations Bathing;Bed Mobility;Bend;Caring for Others;Carry;Dressing;Hygiene/Grooming;Lift;Locomotion Level;Reach Overhead;Self Feeding;Sit;Squat;Stairs;Stand;Transfers;Toileting    Examination-Participation Restrictions Cleaning;Community Activity;Driving;Laundry;Medication Management;Meal Prep;Occupation;Personal Finances;Shop;Yard Work;Volunteer    Stability/Clinical Decision Making Evolving/Moderate complexity    Rehab Potential Fair    PT Frequency 2x / week    PT Duration 12 weeks    PT Treatment/Interventions ADLs/Self Care Home Management;Cryotherapy;Electrical Stimulation;Moist Heat;Ultrasound;DME Instruction;Gait training;Stair training;Functional mobility training;Therapeutic exercise;Balance training;Patient/family education;Orthotic Fit/Training;Neuromuscular re-education;Wheelchair mobility training;Manual techniques;Passive range of motion;Dry needling;Energy conservation;Taping;Visual/perceptual remediation/compensation;Joint Manipulations    PT Next Visit Plan Continue with POC incorporating core strength    PT Home Exercise Plan Discussed performing quad sets, gluteal sets, bridgining as able-    Consulted and Agree with Plan of Care Patient;Family member/caregiver    Family Member Consulted [name] and Dad- [name]              Patient will benefit from skilled therapeutic intervention in order to improve the following deficits and impairments:  Abnormal gait, Decreased activity tolerance, Decreased balance, Decreased cognition, Cardiopulmonary status limiting activity, Decreased coordination, Decreased endurance, Decreased knowledge of use of DME, Decreased mobility, Decreased range of motion, Decreased safety awareness, Decreased strength, Difficulty walking, Hypomobility, Impaired perceived functional ability, Impaired flexibility, Impaired UE functional use, Impaired vision/preception, Improper body mechanics, Postural dysfunction, Obesity  Visit Diagnosis: Muscle weakness (generalized)  Unsteadiness on feet  Abnormality of gait and mobility     Problem List Patient Active Problem List   Diagnosis Date Noted   Sepsis due to vancomycin resistant Enterococcus species (HCC) 06/06/2019   SIRS (systemic inflammatory response syndrome) (HCC) 06/05/2019   Acute lower UTI 06/05/2019   VRE (vancomycin-resistant Enterococci) infection 06/05/2019   Anemia 06/05/2019   Skin ulcer of sacrum with necrosis of muscle (HCC)    Urinary retention    Type 2 diabetes mellitus without complication, with long-term current use of insulin (HCC)    Tachycardia    Lower extremity edema    Acute metabolic encephalopathy    Obstructive sleep apnea    Morbid obesity with BMI of 60.0-69.9, adult (HCC)    Goals of care, counseling/discussion    Palliative care encounter    Sepsis (HCC) 04/27/2019   H/O insulin dependent diabetes mellitus 04/27/2019   History of CVA with residual deficit 04/27/2019   Seizure disorder (HCC) 04/27/2019   Decubitus ulcer of sacral region, stage 4 (HCC) 04/27/2019    Precious Bard, PT, DPT  03/08/2021, 4:22 PM  [facility] MAIN [facility] SERVICES [address] [place], [place], [address] Phone: [phone]   Fax:  [phone]  Name: Paul Hall MRN: [id number] Date of Birth: 03/22/1996

## 2021-03-08 NOTE — Therapy (Signed)
[facility] MAIN [facility] SERVICES [address] [place], [place], [address] Phone: [phone]   Fax:  [phone]  Occupational Therapy Treatment  Patient Details  Name: Paul Hall MRN: [id number] Date of Birth: 09/22/1995 Referring Provider (OT): Lenise Herald   Encounter Date: 03/08/2021   OT End of Session - 03/08/21 1732     Visit Number 17    Number of Visits 24    Date for OT Re-Evaluation 03/24/21    Authorization Type Progress report period starting 12/15/2020    OT Start Time 1605    OT Stop Time 1650    OT Time Calculation (min) 45 min    Activity Tolerance Patient tolerated treatment well    Behavior During Therapy [facility] for tasks assessed/performed             Past Medical History:  Diagnosis Date   Diabetes mellitus (HCC)    Hypertension    Stroke [facility])     Past Surgical History:  Procedure Laterality Date   IVC FILTER PLACEMENT ([facility] HX)     LEG SURGERY     PEG TUBE PLACEMENT     TRACHEOSTOMY      There were no vitals filed for this visit.   Subjective Assessment - 03/08/21 1731     Subjective  Pt. reports having had a nice holiday.    Patient is accompanied by: Family member    Pertinent History Pt. is a 25 y.o. male who was hospitalized, and diagnosed with COVID-19, sustained a CVA with resultant quadreplegia in 01/2019. Pt. was hospitalized with VRE  UTI. Pt. PMHx includes: urinary retention, Seizure Disorder, Obstructive Sleep Disorder, DM Type II, and Morbid obesity. Pt. resides at home with his parents.    Currently in Pain? Yes    Pain Score --   Not rated   Pain Location Finger (Comment which one)    Pain Orientation Left    Pain Descriptors / Indicators Tender    Pain Type Chronic pain            OT TREATMENT     Estim attended:   Pt. tolerated neuromuscular estim  for 8 min., cycle time 10/10,  duty cycle 50%. Applied to the right wrist, and digit extensors, followed by  8 min.  Applied to the left wrist, and digit extensors with the intensity set to 40, and constant monitoring 100% of the time. Manual assist was required for wrist and digit extension with each rep. To hold the position.    Therapeutic Exercise:   Patient tolerated active range of motion reps for bilateral shoulder flexion, abduction. Passive range of motion for bilateral elbow flexion and extension with slow prolonged gentle stretching for elbow extension, forearm supination, wrist extension, and digit extension. Emphasis was placed on bilateral supination stretches secondary to increased tightness. Patient tolerated moist heat modality to his bilateral hands prior to range of motion.   Pt. Reports that he really can really feel it after working with PT today. Pt. Reported tenderness in his right digits today upon arrival. Pt. Was able to tolerate ROM following moist heat. Pt. continues to tolerate Estim well to the right, and left wrist, and digit extensors with manual assist. Pt. Was assisted with donning his jacket with cues for the UEs, and forward leaning at the trunk. Patient continues to work on normalizing tone in the bilateral upper extremities in order to improve range of motion, and facilitate active range of motion in order  to increase engagement in ADLs and IADL tasks.                          OT Education - 03/08/21 1732     Education Details BUE ROM    Person(s) Educated Patient    Methods Explanation    Comprehension Verbalized understanding;Verbal cues required;Need further instruction                 OT Long Term Goals - 01/20/21 1311       OT LONG TERM GOAL #1   Title Pt. will improve FOTO score by 2 points for clinically significant improvements during ADLs, and IADLs,    Baseline 10th Visit: FOTO score 42 Eval: FOTO score: 41    Time 12    Period Weeks    Status On-going    Target Date 03/24/21      OT LONG TERM GOAL #2   Title Pt. will improve  right shoulder flexion by 10 degrees to assist caregivers with UE dressing    Baseline 10th visit: ROM  R 65 scaption(95) Attempts for Active flexion result in scaption. Eval: R: 62(94), L: 114(120    Time 12    Period Weeks    Status On-going    Target Date 03/24/21      OT LONG TERM GOAL #3   Title Pt. will improve right Abduction by 10 degrees to assist caregivers with underarm hygiene care.    Baseline 10th visit: 86(95) Eval: R: 76(93)    Time 12    Period Weeks    Status On-going    Target Date 03/24/21      OT LONG TERM GOAL #4   Title Pt. will demonstrate visual compensatory strategies for navigating through his environment.    Baseline 10th visit: continues to present with limited vision. Eval: Limited    Time 12    Period Weeks    Status On-going    Target Date 03/24/21      OT LONG TERM GOAL #5   Title Pt. will demonstrate visual compensatory strategies for tabletop tasks within his near space.    Baseline 10th visit: Pt. continues to present with limited vision. Eval: Pt. has difficulty    Time 12    Period Weeks    Target Date 03/24/21      OT LONG TERM GOAL #6   Title Pt. will perform hand to mouth patterns with min A in preparation for self-feeding.    Baseline 10th visit: Pt. is able to perfrom hand to mouth patterns, however reports weakness in his hand  limits a secure grasp on the utensi Pt. requires assist to perform. Eval: Pt. has difficulty    Time 12    Period Weeks    Status On-going    Target Date 03/24/21      OT LONG TERM GOAL #7   Title Pt. will perform one step self-grooming tasks with minA.    Baseline 10th visit: Pt. has difficulty sustaining the UEs in elevation for the duration required to perfrom self-grooming tasks thoroughly.Eval: pt. has difficulty    Time 12    Period Weeks    Status On-going    Target Date 03/24/21                   Plan - 03/08/21 1733     Clinical Impression Statement Pt. Reports that he really can  really feel it after working  with PT today. Pt. Reported tenderness in his right digits today upon arrival. Pt. Was able to tolerate ROM following moist heat. Pt. continues to tolerate Estim well to the right, and left wrist, and digit extensors with manual assist. Pt. Was assisted with donning his jacket with cues for the UEs, and forward leaning at the trunk. Patient continues to work on normalizing tone in the bilateral upper extremities in order to improve range of motion, and facilitate active range of motion in order to increase engagement in ADLs and IADL tasks.     OT Occupational Profile and History Detailed Assessment- Review of Records and additional review of physical, cognitive, psychosocial history related to current functional performance    Occupational performance deficits (Please refer to evaluation for details): ADL's;IADL's    Body Structure / Function / Physical Skills ADL;FMC;ROM;Dexterity;IADL;Endurance;Obesity;Strength;UE functional use    Rehab Potential Good    Clinical Decision Making Multiple treatment options, significant modification of task necessary    Comorbidities Affecting Occupational Performance: Presence of comorbidities impacting occupational performance    Modification or Assistance to Complete Evaluation  Max significant modification of tasks or assist is necessary to complete    OT Frequency 2x / week    OT Duration 12 weeks    OT Treatment/Interventions Self-care/ADL training;Therapeutic exercise;Neuromuscular education;Visual/perceptual remediation/compensation;Patient/family education;Therapeutic activities;DME and/or AE instruction;Passive range of motion;Moist Heat;Electrical Stimulation    Consulted and Agree with Plan of Care Patient             Patient will benefit from skilled therapeutic intervention in order to improve the following deficits and impairments:   Body Structure / Function / Physical Skills: ADL, FMC, ROM, Dexterity, IADL,  Endurance, Obesity, Strength, UE functional use       Visit Diagnosis: Muscle weakness (generalized)    Problem List Patient Active Problem List   Diagnosis Date Noted   Sepsis due to vancomycin resistant Enterococcus species (HCC) 06/06/2019   SIRS (systemic inflammatory response syndrome) (HCC) 06/05/2019   Acute lower UTI 06/05/2019   VRE (vancomycin-resistant Enterococci) infection 06/05/2019   Anemia 06/05/2019   Skin ulcer of sacrum with necrosis of muscle (HCC)    Urinary retention    Type 2 diabetes mellitus without complication, with long-term current use of insulin (HCC)    Tachycardia    Lower extremity edema    Acute metabolic encephalopathy    Obstructive sleep apnea    Morbid obesity with BMI of 60.0-69.9, adult (HCC)    Goals of care, counseling/discussion    Palliative care encounter    Sepsis (HCC) 04/27/2019   H/O insulin dependent diabetes mellitus 04/27/2019   History of CVA with residual deficit 04/27/2019   Seizure disorder (HCC) 04/27/2019   Decubitus ulcer of sacral region, stage 4 (HCC) 04/27/2019    Olegario Messier, MS, OTR/L 03/08/2021, 5:34 PM  [facility] MAIN [facility] SERVICES [address] [place], [place], [address] Phone: [phone]   Fax:  [phone]  Name: Jihad Brownlow MRN: [id number] Date of Birth: 12/15/1995

## 2021-03-10 ENCOUNTER — Encounter: Payer: BC Managed Care – PPO | Admitting: Speech Pathology

## 2021-03-10 ENCOUNTER — Ambulatory Visit: Payer: BC Managed Care – PPO | Admitting: Occupational Therapy

## 2021-03-10 ENCOUNTER — Ambulatory Visit: Payer: BC Managed Care – PPO

## 2021-03-10 ENCOUNTER — Other Ambulatory Visit: Payer: Self-pay

## 2021-03-10 DIAGNOSIS — M6281 Muscle weakness (generalized): Secondary | ICD-10-CM

## 2021-03-10 DIAGNOSIS — R2689 Other abnormalities of gait and mobility: Secondary | ICD-10-CM | POA: Diagnosis not present

## 2021-03-10 DIAGNOSIS — I6930 Unspecified sequelae of cerebral infarction: Secondary | ICD-10-CM | POA: Diagnosis not present

## 2021-03-10 DIAGNOSIS — R269 Unspecified abnormalities of gait and mobility: Secondary | ICD-10-CM

## 2021-03-10 DIAGNOSIS — R278 Other lack of coordination: Secondary | ICD-10-CM

## 2021-03-10 DIAGNOSIS — R262 Difficulty in walking, not elsewhere classified: Secondary | ICD-10-CM | POA: Diagnosis not present

## 2021-03-10 DIAGNOSIS — R2681 Unsteadiness on feet: Secondary | ICD-10-CM

## 2021-03-10 NOTE — Therapy (Signed)
[place] [facility] MAIN [facility] SERVICES [address] [place], [place], [address] Phone: [phone]   Fax:  [phone]  Occupational Therapy Treatment  Patient Details  Name: Paul Hall MRN: [id number] Date of Birth: March 24, 1996 Referring Provider (OT): Lenise Herald   Encounter Date: 03/10/2021   OT End of Session - 03/11/21 0815     Visit Number 18    Number of Visits 24    Date for OT Re-Evaluation 03/24/21    Authorization Type Progress report period starting 12/15/2020    OT Start Time 1515    OT Stop Time 1600    OT Time Calculation (min) 45 min    Equipment Utilized During Treatment tilt in space power wc    Activity Tolerance Patient tolerated treatment well    Behavior During Therapy WFL for tasks assessed/performed             Past Medical History:  Diagnosis Date   Diabetes mellitus (HCC)    Hypertension    Stroke [facility])     Past Surgical History:  Procedure Laterality Date   IVC FILTER PLACEMENT ([facility] HX)     LEG SURGERY     PEG TUBE PLACEMENT     TRACHEOSTOMY      There were no vitals filed for this visit.   Subjective Assessment - 03/11/21 0814     Subjective  Pt reports completing BLE HEP but no BUE HEP.    Patient is accompanied by: Family member    Pertinent History Pt. is a 25 y.o. male who was hospitalized, and diagnosed with COVID-19, sustained a CVA with resultant quadreplegia in 01/2019. Pt. was hospitalized with VRE  UTI. Pt. PMHx includes: urinary retention, Seizure Disorder, Obstructive Sleep Disorder, DM Type II, and Morbid obesity. Pt. resides at home with his parents.    Limitations BUE, and LE limitations with ROM, strength, motor control, and [facility] skills.    Patient Stated Goals To improve his arms, and hands.    Currently in Pain? No/denies    Pain Onset More than a month ago    Pain Onset More than a month ago               Electrical Stimulation: Pt tolerated neuromuscular  estim for L hand 8 min, cycle time 10/10,  duty cycle 50%. Applied to the left wrist, and digit extensors with the intensity set to 40 for 6 min, decreased to 36 for 2 min at pt request. Constant monitoring 100% of the time. Manual assist was required for wrist and digit extension with each rep to hold the position.  Pt was cued to alternate squeezing hand at rest, then straighten wrist and fingers with ramp up to prepare for grasp/release of ADL supplies.   Therapeutic Exercise: Pt tolerated seated BUE therex over head press, chest press, and horizontal abduction. Initial set LUE using 1# wrist weight followed by 2nd set using 2# wrist weight. Initial set RUE using 1/2# wrist weight followed by 2nd set using 1# wrist weight. Pt requires rest breaks and verbal cues for proper technique. Pt and family instructed on HEP.     Access Code: [facility] URL: [email or web address] Date: 03/11/2021 Prepared by: Kathie Dike  Program Notes Complete each exercise 1 arm at a time with wrist weights attached. Take rest breaks between sets. If painful please stop and report at next OT session.    Exercises Seated Overhead Press with Dumbbells - 1 x daily - 7 x weekly -  2 sets - 15 reps Seated Single Arm Chest Press with Dumbbell - 1 x daily - 7 x weekly - 2 sets - 15 reps Seated Arm Crossing - 1 x daily - 7 x weekly - 2 sets - 15 reps              OT Education - 03/11/21 0815     Education Details BUE HEP    Person(s) Educated Patient    Methods Explanation    Comprehension Verbalized understanding;Verbal cues required;Need further instruction                 OT Long Term Goals - 01/20/21 1311       OT LONG TERM GOAL #1   Title Pt. will improve FOTO score by 2 points for clinically significant improvements during ADLs, and IADLs,    Baseline 10th Visit: FOTO score 42 Eval: FOTO score: 41    Time 12    Period Weeks    Status On-going    Target Date 03/24/21       OT LONG TERM GOAL #2   Title Pt. will improve right shoulder flexion by 10 degrees to assist caregivers with UE dressing    Baseline 10th visit: ROM  R 65 scaption(95) Attempts for Active flexion result in scaption. Eval: R: 62(94), L: 114(120    Time 12    Period Weeks    Status On-going    Target Date 03/24/21      OT LONG TERM GOAL #3   Title Pt. will improve right Abduction by 10 degrees to assist caregivers with underarm hygiene care.    Baseline 10th visit: 86(95) Eval: R: 76(93)    Time 12    Period Weeks    Status On-going    Target Date 03/24/21      OT LONG TERM GOAL #4   Title Pt. will demonstrate visual compensatory strategies for navigating through his environment.    Baseline 10th visit: continues to present with limited vision. Eval: Limited    Time 12    Period Weeks    Status On-going    Target Date 03/24/21      OT LONG TERM GOAL #5   Title Pt. will demonstrate visual compensatory strategies for tabletop tasks within his near space.    Baseline 10th visit: Pt. continues to present with limited vision. Eval: Pt. has difficulty    Time 12    Period Weeks    Target Date 03/24/21      OT LONG TERM GOAL #6   Title Pt. will perform hand to mouth patterns with min A in preparation for self-feeding.    Baseline 10th visit: Pt. is able to perfrom hand to mouth patterns, however reports weakness in his hand  limits a secure grasp on the utensi Pt. requires assist to perform. Eval: Pt. has difficulty    Time 12    Period Weeks    Status On-going    Target Date 03/24/21      OT LONG TERM GOAL #7   Title Pt. will perform one step self-grooming tasks with minA.    Baseline 10th visit: Pt. has difficulty sustaining the UEs in elevation for the duration required to perfrom self-grooming tasks thoroughly.Eval: pt. has difficulty    Time 12    Period Weeks    Status On-going    Target Date 03/24/21  Plan - 03/11/21 0815     Clinical  Impression Statement Pt was assisted with doffing his jacket with cues for the UEs, and forward leaning at the trunk. Pt tolerated 1# wrist weight for RUE exercises and 2# wrist weight for LUE exercises. Patient continues to work on normalizing tone in the bilateral upper extremities in order to improve range of motion, and facilitate active range of motion in order to increase engagement in ADLs and IADL tasks.    OT Occupational Profile and History Detailed Assessment- Review of Records and additional review of physical, cognitive, psychosocial history related to current functional performance    Occupational performance deficits (Please refer to evaluation for details): ADL's;IADL's    Body Structure / Function / Physical Skills ADL;FMC;ROM;Dexterity;IADL;Endurance;Obesity;Strength;UE functional use    Rehab Potential Good    Clinical Decision Making Multiple treatment options, significant modification of task necessary    Comorbidities Affecting Occupational Performance: Presence of comorbidities impacting occupational performance    Modification or Assistance to Complete Evaluation  Max significant modification of tasks or assist is necessary to complete    OT Frequency 2x / week    OT Duration 12 weeks    OT Treatment/Interventions Self-care/ADL training;Therapeutic exercise;Neuromuscular education;Visual/perceptual remediation/compensation;Patient/family education;Therapeutic activities;DME and/or AE instruction;Passive range of motion;Moist Heat;Electrical Stimulation    Consulted and Agree with Plan of Care Patient             Patient will benefit from skilled therapeutic intervention in order to improve the following deficits and impairments:   Body Structure / Function / Physical Skills: ADL, FMC, ROM, Dexterity, IADL, Endurance, Obesity, Strength, UE functional use       Visit Diagnosis: Other lack of coordination  Muscle weakness (generalized)  History of CVA with residual  deficit    Problem List Patient Active Problem List   Diagnosis Date Noted   Sepsis due to vancomycin resistant Enterococcus species (HCC) 06/06/2019   SIRS (systemic inflammatory response syndrome) (HCC) 06/05/2019   Acute lower UTI 06/05/2019   VRE (vancomycin-resistant Enterococci) infection 06/05/2019   Anemia 06/05/2019   Skin ulcer of sacrum with necrosis of muscle (HCC)    Urinary retention    Type 2 diabetes mellitus without complication, with long-term current use of insulin (HCC)    Tachycardia    Lower extremity edema    Acute metabolic encephalopathy    Obstructive sleep apnea    Morbid obesity with BMI of 60.0-69.9, adult (HCC)    Goals of care, counseling/discussion    Palliative care encounter    Sepsis (HCC) 04/27/2019   H/O insulin dependent diabetes mellitus 04/27/2019   History of CVA with residual deficit 04/27/2019   Seizure disorder (HCC) 04/27/2019   Decubitus ulcer of sacral region, stage 4 (HCC) 04/27/2019    Kathie Dike, M.S. OTR/L  03/11/21, 8:25 AM  ascom [phone]  [place] [facility] MAIN [facility] SERVICES [address] [place], [place], [address] Phone: [phone]   Fax:  [phone]  Name: Demont Linford MRN: [id number] Date of Birth: 1995-10-22

## 2021-03-10 NOTE — Therapy (Signed)
[place] [facility] MAIN [facility] SERVICES [address] [place], [place], [address] Phone: [phone]   Fax:  [phone]  Physical Therapy Treatment  Patient Details  Name: Paul Hall MRN: [id number] Date of Birth: [birth year] Referring Provider (PT): Lenise Herald, PA-C   Encounter Date: 03/10/2021   PT End of Session - 03/10/21 1541     Visit Number 9    Number of Visits 25    Date for PT Re-Evaluation 03/29/21    Authorization Type [employer] and [employer]- Need auth past 12th visit    Authorization - Visit Number 7    Authorization - Number of Visits 12    Progress Note Due on Visit 10    PT Start Time 1600    PT Stop Time 1647    PT Time Calculation (min) 47 min    Equipment Utilized During Treatment Other (comment)   Hoyer lift   Activity Tolerance Patient tolerated treatment well    Behavior During Therapy WFL for tasks assessed/performed             Past Medical History:  Diagnosis Date   Diabetes mellitus (HCC)    Hypertension    Stroke [facility])     Past Surgical History:  Procedure Laterality Date   IVC FILTER PLACEMENT ([facility] HX)     LEG SURGERY     PEG TUBE PLACEMENT     TRACHEOSTOMY      There were no vitals filed for this visit.   Subjective Assessment - 03/11/21 0828     Subjective Patient is highly motivated, wants to put weight through legs.    Patient is accompained by: Family member   [name]; [name]   Pertinent History Patient is a 25 y.o. male with insulin dependent diabetes and morbid obesity (BMI 65) who was admitted with ARDS in the setting of Covid-19 On10/18/2020 History taken per admission note dated 01/20/2019. On 11/13, the patient was noticed to have RUE twitching. A noncontrast head CT was done and discovered an ischemic stroke and a subdural hemorrhage with slight midline shift. ICP management was initiated by keeping Na > 145 (on 283ml/hr normal saline) and head of the bed > 90  degrees. Neurosurgery was consulted and does not recommend surgical decompression at this time. His heparin gtt that was being used to treat his bilateral DVTs was stopped and vascular surgery was consulted to place and IVC filter on 11/14. On 11/15 he was transferred back to the MICU after his CVAs did not worsen or evolve. He was not able to be weaned from the vent and underwent trach placement on 11/18 and G-tube placement on 11/19.    On 11/26 patient was noted to have "inward eye deviation" and "downward eye deviation with occasional twitch in the left eye" per nursing, concerning for seizure-like activity. He was restarted on cvEEG today and continued to Keppra 1 g BID. CT head wo contrast was obtained which showed new left frontal IPH with 1 cm midline shift and subfalcine herniation, and prior large left hemispheric infarct. . The patient's neurological exam has remained stable with brainstem reflexes intact, but functionally quadriplegic. with inward/downward eye deviation on 11/26 while on maintenance Keppra. There is no pathological intracranial focality in epilepsy that would make the patient's gaze present in such a manner. There was mild dysconjugate gaze with the right eye out, however, this was not fixed, there was no rhythmic, stereotyped movements to suggest seizures. cvEEG monitoring 11/27-11/28 revealed  no seizures only focal slowing consistent with his known vascular insult. There was a push button event for extremity shaking that had no ccvEEG correlate.  - Keppra 2 g BID.     Subacute ischemic stroke (left, ACA/MCA/PCA distribution)  Likely secondary to COVID hypercoagulable state. Carotid Dopplers negative. Stroke likely subacute given 3 serial CTs demonstrated no evolution of stroke size. Will get statin once CK/rhabdomyolysis and abnl LFTs resolve. 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    Limitations  Lifting;Standing;Walking;Writing;Reading;Sitting;House hold activities    How long can you sit comfortably? Patient reports uable to sit up long < 4 hours due to buttock discomfort    How long can you stand comfortably? Patient is currently unable to stand    How long can you walk comfortably? Patient is currently unable to walk    Diagnostic tests FINDINGS:  Brain: Left cerebral convexity cortical hypodensity with associated  cortical calcification extending from the frontal to the parietal  lobe. Extensive area of volume loss and low-density compatible with  chronic infarction. This is noted on prior reports from [facility].  Probable chronic watershed infarct. Additional small areas of  hypodensity in the right frontal white matter consistent with  chronic ischemia.     Negative for acute infarct. No acute hemorrhage or mass. Ventricle  size normal without midline shift.     Vascular: Normal arterial flow voids.     Skull: Negative     Sinuses/Orbits: Mucosal edema paranasal sinuses with air-fluid level  left sphenoid sinus. Negative orbit     Other: None     IMPRESSION:  Extensive watershed type infarct over the left cerebral convexity  with cortical calcification. This appears chronic. No associated  hemorrhage, hydrocephalus, or midline shift.     These results were called by telephone at the time of interpretation  on 05/09/2019 at 3:12 pm to provider Alford Highland , who verbally  acknowledged these results.        Electronically Signed    By: Marlan Palau M.D.    On: 05/09/2019 15:13       Result History    CT HEAD WO CONTRAST (Order #[id number]) on 05/09/2019 - Order Result History Report    Patient Stated Goals I want to be able to possibly walk again    Currently in Pain? No/denies                      Treatment:      Transfer from Power wheelchair to treatment table utilizing hoyer lift and 3 x assist with 1 staff and 2 SPT.    Chair positioned to lowest setting with pillow behind  head when resting -upright posture without UE support or stabilization 60 seconds x 3 trials -lateral weight shift 5x each side, PT on either side of patient providing tactile cueing for range of lean;  -cross body punch with AAROM for RUE performed 8x each UE with focus on trunk twist ; improved ROM with RUE  -sit up 10x from reclined plinth table with hands on PT hands; very challenging for patient -LE press down into PT hand for LE isometric contraction 10x 5 second holds each LE -hamstring isometric pressing into PT hand 10x 5 second holds     saebo/sabina plus lift x3 person assist  from plinth table on lowest setting. First attempt unsuccessful as patient is not leaning forwards. Second attempt successful with cue for leaning forward and pushing through  LE's. Stand 60 seconds, very fatiguing.    Transfer from treatment table to Power wheelchair utilizing hoyer lift and 3 x assist with 1 staff and 2 SPT.      Pt educated throughout session about proper posture and technique with exercises. Improved exercise technique, movement at target joints, use of target muscles after min to mod verbal, visual, tactile cues.    Patient successfully performed the [place] plus stand transfer for first time this session tolerating approximately a minute in standing position. He is very fatigued by standing. His first attempt was unsuccessful with standing due to fear and limited forward shift, however his second attempt was successful. Patient will continue to benefit from skilled physical therapy to improve his core strength, as well as to improve his quality of life.                  PT Education - 03/10/21 1540     Education Details exercise technique, body mechanics    Person(s) Educated Patient    Methods Explanation;Demonstration;Tactile cues;Verbal cues    Comprehension Verbalized understanding;Returned demonstration;Verbal cues required;Tactile cues required              PT  Short Term Goals - 01/04/21 1505       PT SHORT TERM GOAL #1   Title Pt will be independent with HEP in order to improve strength and balance in order to decrease fall risk and improve function at home and work.    Baseline 01/04/2021= No formal HEP in place.    Time 6    Period Weeks    Status New    Target Date 02/15/21               PT Long Term Goals - 01/19/21 0951       PT LONG TERM GOAL #1   Title Patient will increase BLE gross strength by 1/2 muscle grade to improve functional strength for improved independence with potential gait, increased standing tolerance and increased ADL ability.    Baseline 01/04/2021- Patient presents with 1/5 to 3-/5 B LE strength with MMT.    Time 12    Period Weeks    Status New    Target Date 03/29/21      PT LONG TERM GOAL #2   Title Patient will tolerate sitting unsupported demonstrating erect sitting posture for 15 minutes with CGA to demonstrate improved back extensor strength and improved sitting tolerance.    Baseline 01/04/2021- Patient confied to sitting in lift chair or electric power chair with back support and unable to sit upright without physical assistance.    Time 12    Period Weeks    Status New    Target Date 03/29/21      PT LONG TERM GOAL #3   Title Patient will demonstrate ability to perform static standing in // bars > 2 min with Max Assist  without loss of balance and fair posture for improved overall strength for pre-gait and transfer activities.    Baseline 01/04/2021= Patient current uanble to stand- Dependent on hoyer or sit to stand lift for transfers.    Time 12    Period Weeks    Status New    Target Date 03/29/21      PT LONG TERM GOAL #4   Title Pt will improve FOTO score by 10 points or more demonstrating improved perceived functional ability    Baseline FOTO 7 on 10/17    Time 12  Period Weeks    Status New    Target Date 03/29/21                   Plan - 03/11/21 0830     Clinical  Impression Statement Patient successfully performed the [place] plus stand transfer for first time this session tolerating approximately a minute in standing position. He is very fatigued by standing. His first attempt was unsuccessful with standing due to fear and limited forward shift, however his second attempt was successful. Patient will continue to benefit from skilled physical therapy to improve his core strength, as well as to improve his quality of life.    Personal Factors and Comorbidities Comorbidity 3+;Time since onset of injury/illness/exacerbation    Comorbidities CVA, diabetes, Seizures    Examination-Activity Limitations Bathing;Bed Mobility;Bend;Caring for Others;Carry;Dressing;Hygiene/Grooming;Lift;Locomotion Level;Reach Overhead;Self Feeding;Sit;Squat;Stairs;Stand;Transfers;Toileting    Examination-Participation Restrictions Cleaning;Community Activity;Driving;Laundry;Medication Management;Meal Prep;Occupation;Personal Finances;Shop;Yard Work;Volunteer    Stability/Clinical Decision Making Evolving/Moderate complexity    Rehab Potential Fair    PT Frequency 2x / week    PT Duration 12 weeks    PT Treatment/Interventions ADLs/Self Care Home Management;Cryotherapy;Electrical Stimulation;Moist Heat;Ultrasound;DME Instruction;Gait training;Stair training;Functional mobility training;Therapeutic exercise;Balance training;Patient/family education;Orthotic Fit/Training;Neuromuscular re-education;Wheelchair mobility training;Manual techniques;Passive range of motion;Dry needling;Energy conservation;Taping;Visual/perceptual remediation/compensation;Joint Manipulations    PT Next Visit Plan Continue with POC incorporating core strength    PT Home Exercise Plan Discussed performing quad sets, gluteal sets, bridgining as able-    Consulted and Agree with Plan of Care Patient;Family member/caregiver    Family Member Consulted [name] and Dad- [name]             Patient will benefit from  skilled therapeutic intervention in order to improve the following deficits and impairments:  Abnormal gait, Decreased activity tolerance, Decreased balance, Decreased cognition, Cardiopulmonary status limiting activity, Decreased coordination, Decreased endurance, Decreased knowledge of use of DME, Decreased mobility, Decreased range of motion, Decreased safety awareness, Decreased strength, Difficulty walking, Hypomobility, Impaired perceived functional ability, Impaired flexibility, Impaired UE functional use, Impaired vision/preception, Improper body mechanics, Postural dysfunction, Obesity  Visit Diagnosis: Muscle weakness (generalized)  Unsteadiness on feet  Abnormality of gait and mobility     Problem List Patient Active Problem List   Diagnosis Date Noted   Sepsis due to vancomycin resistant Enterococcus species (HCC) 06/06/2019   SIRS (systemic inflammatory response syndrome) (HCC) 06/05/2019   Acute lower UTI 06/05/2019   VRE (vancomycin-resistant Enterococci) infection 06/05/2019   Anemia 06/05/2019   Skin ulcer of sacrum with necrosis of muscle (HCC)    Urinary retention    Type 2 diabetes mellitus without complication, with long-term current use of insulin (HCC)    Tachycardia    Lower extremity edema    Acute metabolic encephalopathy    Obstructive sleep apnea    Morbid obesity with BMI of 60.0-69.9, adult (HCC)    Goals of care, counseling/discussion    Palliative care encounter    Sepsis (HCC) 04/27/2019   H/O insulin dependent diabetes mellitus 04/27/2019   History of CVA with residual deficit 04/27/2019   Seizure disorder (HCC) 04/27/2019   Decubitus ulcer of sacral region, stage 4 (HCC) 04/27/2019    Precious Bard, PT, DPT  03/11/2021, 8:31 AM  [facility] MAIN [facility] SERVICES [address] [place], [place], [address] Phone: [phone]   Fax:  [phone]  Name: Paul Hall MRN: [id number] Date of Birth:  [birth year]

## 2021-03-15 ENCOUNTER — Encounter: Payer: BC Managed Care – PPO | Admitting: Speech Pathology

## 2021-03-15 ENCOUNTER — Ambulatory Visit: Payer: BC Managed Care – PPO

## 2021-03-15 ENCOUNTER — Other Ambulatory Visit: Payer: Self-pay

## 2021-03-15 ENCOUNTER — Ambulatory Visit: Payer: BC Managed Care – PPO | Admitting: Occupational Therapy

## 2021-03-15 DIAGNOSIS — R269 Unspecified abnormalities of gait and mobility: Secondary | ICD-10-CM | POA: Diagnosis not present

## 2021-03-15 DIAGNOSIS — M6281 Muscle weakness (generalized): Secondary | ICD-10-CM

## 2021-03-15 DIAGNOSIS — R278 Other lack of coordination: Secondary | ICD-10-CM

## 2021-03-15 DIAGNOSIS — R262 Difficulty in walking, not elsewhere classified: Secondary | ICD-10-CM

## 2021-03-15 DIAGNOSIS — R2681 Unsteadiness on feet: Secondary | ICD-10-CM

## 2021-03-15 DIAGNOSIS — R2689 Other abnormalities of gait and mobility: Secondary | ICD-10-CM | POA: Diagnosis not present

## 2021-03-15 DIAGNOSIS — I6930 Unspecified sequelae of cerebral infarction: Secondary | ICD-10-CM | POA: Diagnosis not present

## 2021-03-15 NOTE — Therapy (Signed)
[place] MAIN [facility] SERVICES [address] [place], [place], [address] Phone: [phone]   Fax:  [phone]  Physical Therapy Treatment Physical Therapy Progress Note   Dates of reporting period  01/04/21   to   03/15/21   Patient Details  Name: Paul Hall MRN: [id number] Date of Birth: Feb 25, 1996 Referring Provider (PT): Collene Mares, [place]   Encounter Date: 03/15/2021   PT End of Session - 03/15/21 1717     Visit Number 10    Number of Visits 25    Date for PT Re-Evaluation 03/29/21    Authorization Type [employer] and [employer]- Need auth past 12th visit    Authorization Time Period 01/04/21-03/29/21    Authorization - Visit Number 9    Authorization - Number of Visits 12    Progress Note Due on Visit 12    PT Start Time 6962    PT Stop Time 9528    PT Time Calculation (min) 40 min    Activity Tolerance Patient tolerated treatment well;Treatment limited secondary to medical complications (Comment)   queasiness/nausea   Behavior During Therapy [facility] for tasks assessed/performed             Past Medical History:  Diagnosis Date   Diabetes mellitus ([place])    Hypertension    Stroke [facility])     Past Surgical History:  Procedure Laterality Date   IVC FILTER PLACEMENT ([facility] HX)     LEG SURGERY     PEG TUBE PLACEMENT     TRACHEOSTOMY      There were no vitals filed for this visit.   Subjective Assessment - 03/15/21 1729     Subjective Paul Hall arrives today direct from OT visit, reports nauseated since getting on the van to come over. He asks for a limited session today, no in favor of hoyer transfers today, but he is agreeable to 10th visit reassessment,.    Patient is accompained by: Family member    Pertinent History Paul Hall is a 80yoM who presents with severe weakness, quadriparesis, altered sensorium, and visual impairment s/p critical illness and prolonged hospitalization. Pt hospitalized in October  2020 with ARDS 2/2 COVID19 infection. Pt sustained a complex and lengthy hospitalization which included tracheostomy, prolonged sedation, ECMO. In this period pt sustained CVA and SDH. Pt has now been liberated from tracheostomy and G-tube. Pt has since been hospitalized for wound infection and UTI. Pt lives with parents at home, has hospital bed and left chair, hoyer lift transfers, and power WC for mobility needs. Pt needs heavy physical assistance with ADL 2/2 BUE contractures and motor dysfunction.    Limitations Lifting;Standing;Walking;Writing;Reading;Sitting;House hold activities    How long can you sit comfortably? Patient reports uable to sit up long < 4 hours due to buttock discomfort    How long can you stand comfortably? Patient is currently unable to stand    How long can you walk comfortably? Patient is currently unable to walk    Diagnostic tests No recent relavent diagnostic tests    Patient Stated Goals I want to be able to possibly walk again    Currently in Pain? No/denies                [facility] PT Assessment - 03/15/21 0001       Observation/Other Assessments   Focus on Therapeutic Outcomes (FOTO)  12   7 at eval     ROM / Strength   AROM / PROM /  Strength Strength      Strength   Strength Assessment Site Hip;Knee;Ankle    Right/Left Hip Right;Left    Right Hip Flexion 2+/5    Right Hip Extension 2/5    Right Hip External Rotation  2/5    Right Hip Internal Rotation 1/5    Left Hip Flexion 3/5    Left Hip Extension 3+/5    Left Hip External Rotation 3+/5    Left Hip Internal Rotation 3+/5    Right/Left Knee Right;Left    Right Knee Flexion 3-/5    Right Knee Extension 3+/5    Left Knee Flexion 4/5    Left Knee Extension 4/5    Right/Left Ankle Right;Left    Right Ankle Dorsiflexion 0/5    Right Ankle Plantar Flexion 2/5    Left Ankle Inversion 0/5    Left Ankle Eversion 0/5              Intervention:  -in WC semi reclined ~65 degrees to upright  sitting with BUE forward thrust  16x3secH  -intermittent support of trunk as well as adjustment of WC to minimize athrofibrotic resistance at hips        PT Short Term Goals - 03/15/21 1724       PT SHORT TERM GOAL #1   Title Pt will be independent with HEP in order to improve strength and balance in order to decrease fall risk and improve function at home and work.    Baseline 01/04/2021= No formal HEP in place; 12/12 no HEP in place    Time 6    Period Weeks    Status On-going    Target Date 02/15/21               PT Long Term Goals - 03/15/21 1725       PT LONG TERM GOAL #1   Title Patient will increase BLE gross strength by 1/2 muscle grade to improve functional strength for improved independence with potential gait, increased standing tolerance and increased ADL ability.    Baseline 01/04/2021- Patient presents with 1/5 to 3-/5 B LE strength with MMT; 12/12: goal partially met for Left knee/hip    Time 12    Period Weeks    Status On-going    Target Date 03/29/21      PT LONG TERM GOAL #2   Title Patient will tolerate sitting unsupported demonstrating erect sitting posture for 15 minutes with CGA to demonstrate improved back extensor strength and improved sitting tolerance.    Baseline 01/04/2021- Patient confied to sitting in lift chair or electric power chair with back support and unable to sit upright without physical assistance; 12/12: tolerates <1 minutes upright unsupported sitting.    Time 12    Period Weeks    Status On-going    Target Date 03/29/21      PT LONG TERM GOAL #3   Title Patient will demonstrate ability to perform static standing in // bars > 2 min with Max Assist  without loss of balance and fair posture for improved overall strength for pre-gait and transfer activities.    Baseline 01/04/2021= Patient current uanble to stand- Dependent on hoyer or sit to stand lift for transfers.    Time 12    Period Weeks    Status On-going    Target Date  03/29/21      PT LONG TERM GOAL #4   Title Pt will improve FOTO score by 10 points or more demonstrating improved  perceived functional ability    Baseline FOTO 7 on 10/17; 03/15/21: FOTO 12    Time 12    Period Weeks    Status On-going    Target Date 03/29/21                   Plan - 03/15/21 1719     Clinical Impression Statement Pt arrives for 10th visit- reassessment performed this date. Pt shoes improvements in MMT, particularly on left knee and hip. Pt also stronger in active trunk flexion while seated, and although he remains severely weak, this is seen as a big improvement in his capacity for postural adjustments throughout the day. FOTO score improve from 7 to 12. Father reports noting pt is improved with ability to perform partail A/ROM whether in lift chair or wheel chair, but is unable to say for sure is mobility required in bed for bathing/toiletting, ADL assist is significantly better as their biggest aim is return to normal walking similar to several years ago. Pt tolerates session generally well, but is limited at times due to nausea. Will contnue to benefit from skilled PT intervention to maximize independent adn tolerance of mobilitry required for ADL performance.    Personal Factors and Comorbidities Comorbidity 3+;Time since onset of injury/illness/exacerbation    Comorbidities CVA, diabetes, Seizures    Examination-Activity Limitations Bathing;Bed Mobility;Bend;Caring for Others;Carry;Dressing;Hygiene/Grooming;Lift;Locomotion Level;Reach Overhead;Self Feeding;Sit;Squat;Stairs;Stand;Transfers;Toileting    Examination-Participation Restrictions Cleaning;Community Activity;Driving;Laundry;Medication Management;Meal Prep;Occupation;Personal Finances;Shop;Yard Work;Volunteer    Stability/Clinical Decision Making Evolving/Moderate complexity    Clinical Decision Making Moderate    Rehab Potential Fair    PT Frequency 2x / week    PT Duration 12 weeks    PT  Treatment/Interventions ADLs/Self Care Home Management;Cryotherapy;Electrical Stimulation;Moist Heat;Ultrasound;DME Instruction;Gait training;Stair training;Functional mobility training;Therapeutic exercise;Balance training;Patient/family education;Orthotic Fit/Training;Neuromuscular re-education;Wheelchair mobility training;Manual techniques;Passive range of motion;Dry needling;Energy conservation;Taping;Visual/perceptual remediation/compensation;Joint Manipulations    PT Next Visit Plan core strength, postural control    PT Home Exercise Plan Needs formal execution to meet goal    Consulted and Agree with Plan of Care Patient;Family member/caregiver    Family Member Consulted Dad             Patient will benefit from skilled therapeutic intervention in order to improve the following deficits and impairments:  Abnormal gait, Decreased activity tolerance, Decreased balance, Decreased cognition, Cardiopulmonary status limiting activity, Decreased coordination, Decreased endurance, Decreased knowledge of use of DME, Decreased mobility, Decreased range of motion, Decreased safety awareness, Decreased strength, Difficulty walking, Hypomobility, Impaired perceived functional ability, Impaired flexibility, Impaired UE functional use, Impaired vision/preception, Improper body mechanics, Postural dysfunction, Obesity  Visit Diagnosis: Muscle weakness (generalized)  Unsteadiness on feet  Abnormality of gait and mobility  Other lack of coordination  History of CVA with residual deficit  Difficulty in walking, not elsewhere classified  Other abnormalities of gait and mobility     Problem List Patient Active Problem List   Diagnosis Date Noted   Sepsis due to vancomycin resistant Enterococcus species (HCC) 06/06/2019   SIRS (systemic inflammatory response syndrome) ([place]) 06/05/2019   Acute lower UTI 06/05/2019   VRE (vancomycin-resistant Enterococci) infection 06/05/2019   Anemia  06/05/2019   Skin ulcer of sacrum with necrosis of muscle (HCC)    Urinary retention    Type 2 diabetes mellitus without complication, with long-term current use of insulin (HCC)    Tachycardia    Lower extremity edema    Acute metabolic encephalopathy    Obstructive sleep apnea    Morbid obesity with BMI  of 60.0-69.9, adult [facility])    Goals of care, counseling/discussion    Palliative care encounter    Sepsis ([place]) 04/27/2019   H/O insulin dependent diabetes mellitus 04/27/2019   History of CVA with residual deficit 04/27/2019   Seizure disorder ([place]) 04/27/2019   Decubitus ulcer of sacral region, stage 4 ([place]) 04/27/2019   5:39 PM, 03/15/21 Paul Hall, PT, DPT Physical Therapist - [facility]  Outpatient Physical Therapy- [place] [phone]     [place], PT 03/15/2021, 5:39 PM  [place] MAIN [facility] SERVICES [address] [place], [place], [address] Phone: [phone]   Fax:  [phone]  Name: Paul Hall MRN: [id number] Date of Birth: 08-20-1995

## 2021-03-15 NOTE — Therapy (Signed)
[place] [facility] MAIN [facility] SERVICES [address] [place], [place], [address] Phone: [phone]   Fax:  [phone]  Occupational Therapy Treatment  Patient Details  Name: Paul Hall MRN: [id number] Date of Birth: Jun 02, 1995 Referring Provider (OT): Lenise Herald   Encounter Date: 03/15/2021   OT End of Session - 03/15/21 1719     Visit Number 19    Number of Visits 24    Date for OT Re-Evaluation 03/24/21    Authorization Type Progress report period starting 12/15/2020    OT Start Time 1522    OT Stop Time 1600    OT Time Calculation (min) 38 min    Activity Tolerance Patient tolerated treatment well    Behavior During Therapy [facility] for tasks assessed/performed             Past Medical History:  Diagnosis Date   Diabetes mellitus (HCC)    Hypertension    Stroke [facility])     Past Surgical History:  Procedure Laterality Date   IVC FILTER PLACEMENT ([facility] HX)     LEG SURGERY     PEG TUBE PLACEMENT     TRACHEOSTOMY      There were no vitals filed for this visit.   Subjective Assessment - 03/15/21 1718     Subjective  Pt reports completing BLE HEP but no BUE HEP.    Patient is accompanied by: Family member    Pertinent History Pt. is a 25 y.o. male who was hospitalized, and diagnosed with COVID-19, sustained a CVA with resultant quadreplegia in 01/2019. Pt. was hospitalized with VRE  UTI. Pt. PMHx includes: urinary retention, Seizure Disorder, Obstructive Sleep Disorder, DM Type II, and Morbid obesity. Pt. resides at home with his parents.    Currently in Pain? No/denies             OT TREATMENT     Estim attended:   Pt. tolerated neuromuscular estim  for 8 min., cycle time 10/10,  duty cycle 50%. Applied to the right wrist, and digit extensors, followed by  8 min. Applied to the left wrist, and digit extensors with the intensity set to 38, and constant monitoring 100% of the time. Manual assist was required for  wrist and digit extension with each rep. To hold the position.    Therapeutic Exercise:   Patient tolerated active range of motion reps for bilateral shoulder flexion, abduction. Passive range of motion for bilateral elbow flexion and extension with slow prolonged gentle stretching for elbow extension, forearm supination, wrist extension, and digit extension. Emphasis was placed on bilateral supination stretches secondary to increased tightness. Patient tolerated moist heat modality to his bilateral hands prior to range of motion.   Pt. Reported not feeling well upon arrival.  Pt. Presented with intermittent nausea throughout the session. Pt. Reports having had GI issues over the past week. Pt. Was able to tolerate ROM, and Estim. Pt. continues to tolerate Estim well to the right, and left wrist, and digit extensors with manual assist. Pt. Was assisted with donning his jacket with cues for the UEs, and forward leaning at the trunk. Patient continues to work on normalizing tone in the bilateral upper extremities in order to improve range of motion, and facilitate active range of motion in order to increase engagement in ADLs and IADL tasks.  OT Education - 03/15/21 1718     Education Details BUE functioning    Person(s) Educated Patient    Methods Explanation    Comprehension Verbalized understanding;Verbal cues required;Need further instruction                 OT Long Term Goals - 01/20/21 1311       OT LONG TERM GOAL #1   Title Pt. will improve FOTO score by 2 points for clinically significant improvements during ADLs, and IADLs,    Baseline 10th Visit: FOTO score 42 Eval: FOTO score: 41    Time 12    Period Weeks    Status On-going    Target Date 03/24/21      OT LONG TERM GOAL #2   Title Pt. will improve right shoulder flexion by 10 degrees to assist caregivers with UE dressing    Baseline 10th visit: ROM  R 65 scaption(95)  Attempts for Active flexion result in scaption. Eval: R: 62(94), L: 114(120    Time 12    Period Weeks    Status On-going    Target Date 03/24/21      OT LONG TERM GOAL #3   Title Pt. will improve right Abduction by 10 degrees to assist caregivers with underarm hygiene care.    Baseline 10th visit: 86(95) Eval: R: 76(93)    Time 12    Period Weeks    Status On-going    Target Date 03/24/21      OT LONG TERM GOAL #4   Title Pt. will demonstrate visual compensatory strategies for navigating through his environment.    Baseline 10th visit: continues to present with limited vision. Eval: Limited    Time 12    Period Weeks    Status On-going    Target Date 03/24/21      OT LONG TERM GOAL #5   Title Pt. will demonstrate visual compensatory strategies for tabletop tasks within his near space.    Baseline 10th visit: Pt. continues to present with limited vision. Eval: Pt. has difficulty    Time 12    Period Weeks    Target Date 03/24/21      OT LONG TERM GOAL #6   Title Pt. will perform hand to mouth patterns with min A in preparation for self-feeding.    Baseline 10th visit: Pt. is able to perfrom hand to mouth patterns, however reports weakness in his hand  limits a secure grasp on the utensi Pt. requires assist to perform. Eval: Pt. has difficulty    Time 12    Period Weeks    Status On-going    Target Date 03/24/21      OT LONG TERM GOAL #7   Title Pt. will perform one step self-grooming tasks with minA.    Baseline 10th visit: Pt. has difficulty sustaining the UEs in elevation for the duration required to perfrom self-grooming tasks thoroughly.Eval: pt. has difficulty    Time 12    Period Weeks    Status On-going    Target Date 03/24/21                   Plan - 03/15/21 1719     Clinical Impression Statement Pt. Reported not feeling well upon arrival.  Pt. Presented with intermittent nausea throughout the session. Pt. Reports having had GI issues over the past  week. Pt. Was able to tolerate ROM, and Estim. Pt. continues to tolerate Estim well to the right, and  left wrist, and digit extensors with manual assist. Pt. Was assisted with donning his jacket with cues for the UEs, and forward leaning at the trunk. Patient continues to work on normalizing tone in the bilateral upper extremities in order to improve range of motion, and facilitate active range of motion in order to increase engagement in ADLs and IADL tasks.     OT Occupational Profile and History Detailed Assessment- Review of Records and additional review of physical, cognitive, psychosocial history related to current functional performance    Occupational performance deficits (Please refer to evaluation for details): ADL's;IADL's    Body Structure / Function / Physical Skills ADL;FMC;ROM;Dexterity;IADL;Endurance;Obesity;Strength;UE functional use    Rehab Potential Good    Clinical Decision Making Multiple treatment options, significant modification of task necessary    Comorbidities Affecting Occupational Performance: Presence of comorbidities impacting occupational performance    Modification or Assistance to Complete Evaluation  Max significant modification of tasks or assist is necessary to complete    OT Frequency 2x / week    OT Duration 12 weeks    OT Treatment/Interventions Self-care/ADL training;Therapeutic exercise;Neuromuscular education;Visual/perceptual remediation/compensation;Patient/family education;Therapeutic activities;DME and/or AE instruction;Passive range of motion;Moist Heat;Electrical Stimulation    Consulted and Agree with Plan of Care Patient             Patient will benefit from skilled therapeutic intervention in order to improve the following deficits and impairments:   Body Structure / Function / Physical Skills: ADL, FMC, ROM, Dexterity, IADL, Endurance, Obesity, Strength, UE functional use       Visit Diagnosis: Muscle weakness  (generalized)    Problem List Patient Active Problem List   Diagnosis Date Noted   Sepsis due to vancomycin resistant Enterococcus species (HCC) 06/06/2019   SIRS (systemic inflammatory response syndrome) (HCC) 06/05/2019   Acute lower UTI 06/05/2019   VRE (vancomycin-resistant Enterococci) infection 06/05/2019   Anemia 06/05/2019   Skin ulcer of sacrum with necrosis of muscle (HCC)    Urinary retention    Type 2 diabetes mellitus without complication, with long-term current use of insulin (HCC)    Tachycardia    Lower extremity edema    Acute metabolic encephalopathy    Obstructive sleep apnea    Morbid obesity with BMI of 60.0-69.9, adult (HCC)    Goals of care, counseling/discussion    Palliative care encounter    Sepsis (HCC) 04/27/2019   H/O insulin dependent diabetes mellitus 04/27/2019   History of CVA with residual deficit 04/27/2019   Seizure disorder (HCC) 04/27/2019   Decubitus ulcer of sacral region, stage 4 (HCC) 04/27/2019    Olegario Messier, MS, OTR/L 03/15/2021, 5:29 PM  [facility] MAIN [facility] SERVICES [address] [place], [place], [address] Phone: [phone]   Fax:  [phone]  Name: Allenmichael Mcpartlin MRN: [id number] Date of Birth: 05-15-1995

## 2021-03-17 ENCOUNTER — Other Ambulatory Visit: Payer: Self-pay

## 2021-03-17 ENCOUNTER — Ambulatory Visit: Payer: BC Managed Care – PPO | Admitting: Physical Therapy

## 2021-03-17 ENCOUNTER — Encounter: Payer: BC Managed Care – PPO | Admitting: Speech Pathology

## 2021-03-17 ENCOUNTER — Ambulatory Visit: Payer: BC Managed Care – PPO | Admitting: Occupational Therapy

## 2021-03-17 ENCOUNTER — Encounter: Payer: Self-pay | Admitting: Occupational Therapy

## 2021-03-17 DIAGNOSIS — R269 Unspecified abnormalities of gait and mobility: Secondary | ICD-10-CM | POA: Diagnosis not present

## 2021-03-17 DIAGNOSIS — M6281 Muscle weakness (generalized): Secondary | ICD-10-CM

## 2021-03-17 DIAGNOSIS — R278 Other lack of coordination: Secondary | ICD-10-CM | POA: Diagnosis not present

## 2021-03-17 DIAGNOSIS — R2689 Other abnormalities of gait and mobility: Secondary | ICD-10-CM

## 2021-03-17 DIAGNOSIS — I6930 Unspecified sequelae of cerebral infarction: Secondary | ICD-10-CM | POA: Diagnosis not present

## 2021-03-17 DIAGNOSIS — R2681 Unsteadiness on feet: Secondary | ICD-10-CM | POA: Diagnosis not present

## 2021-03-17 DIAGNOSIS — R262 Difficulty in walking, not elsewhere classified: Secondary | ICD-10-CM | POA: Diagnosis not present

## 2021-03-17 NOTE — Therapy (Signed)
[facility] MAIN [facility] SERVICES [address] [place], [place], [address] Phone: [phone]   Fax:  [phone]  Occupational Therapy Progress Note  Dates of reporting period  12/15/2020   to   03/17/2021   Patient Details  Name: Paul Hall MRN: [id number] Date of Birth: 05-Feb-1996 Referring Provider (OT): Lenise Herald   Encounter Date: 03/17/2021   OT End of Session - 03/17/21 1656     Visit Number 20    Number of Visits 24    Date for OT Re-Evaluation 03/24/21    Authorization Type Progress report period starting 12/15/2020    OT Start Time 1517    OT Stop Time 1600    OT Time Calculation (min) 43 min    Activity Tolerance Patient tolerated treatment well    Behavior During Therapy WFL for tasks assessed/performed             Past Medical History:  Diagnosis Date   Diabetes mellitus (HCC)    Hypertension    Stroke [facility])     Past Surgical History:  Procedure Laterality Date   IVC FILTER PLACEMENT ([facility] HX)     LEG SURGERY     PEG TUBE PLACEMENT     TRACHEOSTOMY      There were no vitals filed for this visit.   Subjective Assessment - 03/17/21 1655     Subjective  Pt reports completing BLE HEP but no BUE HEP.    Patient is accompanied by: Family member    Pertinent History Pt. is a 25 y.o. male who was hospitalized, and diagnosed with COVID-19, sustained a CVA with resultant quadreplegia in 01/2019. Pt. was hospitalized with VRE  UTI. Pt. PMHx includes: urinary retention, Seizure Disorder, Obstructive Sleep Disorder, DM Type II, and Morbid obesity. Pt. resides at home with his parents.    Currently in Pain? No/denies            OT TREATMENT     Estim attended:   Pt. tolerated neuromuscular estim  for 8 min., cycle time 10/10,  duty cycle 50%. Applied to the right wrist, and digit extensors, followed by  8 min. Applied to the left wrist, and digit extensors with the intensity set to 38, and constant  monitoring 100% of the time. Manual assist was required for wrist and digit extension with each rep. To hold the position.    Therapeutic Exercise:   Pt. Performed bilateral shoulder abduction with 2# for the LUE, and 1# for the Right. digit extension. Emphasis was placed on bilateral supination stretches secondary to increased tightness. Patient tolerated moist heat modality to his bilateral hands prior to range of motion.   Pt. reports that his nausea is feeling much better. Pt. was able to tolerate ROM, and Estim. Pt. continues to tolerate Estim well to the right, and left wrist, and digit extensors with manual assist. Pt. continues to require assistance with donning his jacket with cues for the UEs, and forward leaning at the trunk. Pt. was able to tolerate UE strengthening with wrist weights. Patient continues to work on normalizing tone in the bilateral upper extremities in order to improve range of motion, and facilitate active range of motion in order to increase engagement in ADLs and IADL tasks.                          OT Education - 03/17/21 1656     Education Details BUE functioning  Person(s) Educated Patient    Methods Explanation    Comprehension Verbalized understanding;Verbal cues required;Need further instruction                 OT Long Term Goals - 03/17/21 1704       OT LONG TERM GOAL #1   Title Pt. will improve FOTO score by 2 points for clinically significant improvements during ADLs, and IADLs,    Baseline 10th Visit: FOTO score 42 Eval: FOTO score: 41    Time 12    Period Weeks    Status On-going    Target Date 03/24/21      OT LONG TERM GOAL #2   Title Pt. will improve right shoulder flexion by 10 degrees to assist caregivers with UE dressing    Baseline Pt. continues to require assist with UE dressing secondary to limited ROM.    Time 12    Period Weeks    Status On-going    Target Date 03/24/21      OT LONG TERM GOAL #3    Title Pt. will improve right Abduction by 10 degrees to assist caregivers with underarm hygiene care.    Baseline Pt. continues to present with limited bilateral abduction.    Time 12    Period Weeks    Status On-going    Target Date 03/24/21      OT LONG TERM GOAL #4   Title Pt. will demonstrate visual compensatory strategies for navigating through his environment.    Baseline Pt. continues to present with limited vision. Eval: Limited    Time 12    Period Weeks    Status On-going    Target Date 03/24/21      OT LONG TERM GOAL #5   Title Pt. will demonstrate visual compensatory strategies for tabletop tasks within his near space.    Baseline Pt. continues to present with limited vision.    Time 12    Period Weeks    Status On-going    Target Date 03/24/21      OT LONG TERM GOAL #6   Title Pt. will perform hand to mouth patterns with min A in preparation for self-feeding.    Baseline Pt. is able to perfrom hand to mouth patterns, however reports weakness in his hand  limits a secure grasp on the utensi Pt. requires assist to perform. Eval: Pt. has difficulty    Time 12    Period Weeks    Status On-going    Target Date 03/24/21      OT LONG TERM GOAL #7   Title Pt. will perform one step self-grooming tasks with minA.    Baseline Pt. has difficulty sustaining the UEs in elevation for the duration required to perfrom self-grooming tasks thoroughly.Eval: pt. has difficulty    Time 12    Status On-going    Target Date 03/24/21                   Plan - 03/17/21 1657     Clinical Impression Statement Pt. reports that his nausea is feeling much better. Pt. was able to tolerate ROM, and Estim. Pt. continues to tolerate Estim well to the right, and left wrist, and digit extensors with manual assist. Pt. continues to require assistance with donning his jacket with cues for the UEs, and forward leaning at the trunk. Pt. was able to tolerate UE strengthening with wrist weights.  Patient continues to work on normalizing tone in the bilateral upper  extremities in order to improve range of motion, and facilitate active range of motion in order to increase engagement in ADLs and IADL tasks.       OT Occupational Profile and History Detailed Assessment- Review of Records and additional review of physical, cognitive, psychosocial history related to current functional performance    Occupational performance deficits (Please refer to evaluation for details): ADL's;IADL's    Body Structure / Function / Physical Skills ADL;FMC;ROM;Dexterity;IADL;Endurance;Obesity;Strength;UE functional use    Rehab Potential Good    Clinical Decision Making Multiple treatment options, significant modification of task necessary    Comorbidities Affecting Occupational Performance: Presence of comorbidities impacting occupational performance    Modification or Assistance to Complete Evaluation  Max significant modification of tasks or assist is necessary to complete    OT Frequency 2x / week    OT Duration 12 weeks    OT Treatment/Interventions Self-care/ADL training;Therapeutic exercise;Neuromuscular education;Visual/perceptual remediation/compensation;Patient/family education;Therapeutic activities;DME and/or AE instruction;Passive range of motion;Moist Heat;Electrical Stimulation    Consulted and Agree with Plan of Care Patient             Patient will benefit from skilled therapeutic intervention in order to improve the following deficits and impairments:   Body Structure / Function / Physical Skills: ADL, FMC, ROM, Dexterity, IADL, Endurance, Obesity, Strength, UE functional use       Visit Diagnosis: Muscle weakness (generalized)  Other lack of coordination    Problem List Patient Active Problem List   Diagnosis Date Noted   Sepsis due to vancomycin resistant Enterococcus species (HCC) 06/06/2019   SIRS (systemic inflammatory response syndrome) (HCC) 06/05/2019   Acute lower  UTI 06/05/2019   VRE (vancomycin-resistant Enterococci) infection 06/05/2019   Anemia 06/05/2019   Skin ulcer of sacrum with necrosis of muscle (HCC)    Urinary retention    Type 2 diabetes mellitus without complication, with long-term current use of insulin (HCC)    Tachycardia    Lower extremity edema    Acute metabolic encephalopathy    Obstructive sleep apnea    Morbid obesity with BMI of 60.0-69.9, adult (HCC)    Goals of care, counseling/discussion    Palliative care encounter    Sepsis (HCC) 04/27/2019   H/O insulin dependent diabetes mellitus 04/27/2019   History of CVA with residual deficit 04/27/2019   Seizure disorder (HCC) 04/27/2019   Decubitus ulcer of sacral region, stage 4 (HCC) 04/27/2019    Olegario Messier, MS, OTR/L 03/17/2021, 5:10 PM  [facility] MAIN [facility] SERVICES [address] [place], [place], [address] Phone: [phone]   Fax:  [phone]  Name: Paul Hall MRN: [id number] Date of Birth: 1996/02/06

## 2021-03-18 NOTE — Therapy (Signed)
[place] MAIN [facility] SERVICES [address] [place], [place], [address] Phone: [phone]   Fax:  [phone]  Physical Therapy Treatment  Patient Details  Name: Paul Hall MRN: [id number] Date of Birth: [birth year] Referring Provider (PT): Collene Mares, PA-C   Encounter Date: 03/17/2021   PT End of Session - 03/18/21 1014     Visit Number 11    Number of Visits 25    Date for PT Re-Evaluation 03/29/21    Authorization Type [employer] and [employer]- Need auth past 12th visit    Authorization Time Period 01/04/21-03/29/21    Authorization - Visit Number 9    Authorization - Number of Visits 12    Progress Note Due on Visit 20    PT Start Time 6222    PT Stop Time 9798    PT Time Calculation (min) 41 min    Equipment Utilized During Treatment Other (comment)   Hoyer lift   Activity Tolerance Patient tolerated treatment well;Treatment limited secondary to medical complications (Comment)   queasiness/nausea   Behavior During Therapy WFL for tasks assessed/performed             Past Medical History:  Diagnosis Date   Diabetes mellitus ([place])    Hypertension    Stroke [facility])     Past Surgical History:  Procedure Laterality Date   IVC FILTER PLACEMENT ([facility] HX)     LEG SURGERY     PEG TUBE PLACEMENT     TRACHEOSTOMY      There were no vitals filed for this visit.   Subjective Assessment - 03/17/21 1509     Subjective [name] arrives today direct from OT visit, reports feelings of nausea are les severe this session but still present with exertion. Pt reports no other significant changes since last session.    Patient is accompained by: Family member    Pertinent History Paul Hall is a 65yoM who presents with severe weakness, quadriparesis, altered sensorium, and visual impairment s/p critical illness and prolonged hospitalization. Pt hospitalized in October 2020 with ARDS 2/2 COVID19 infection. Pt sustained a  complex and lengthy hospitalization which included tracheostomy, prolonged sedation, ECMO. In this period pt sustained CVA and SDH. Pt has now been liberated from tracheostomy and G-tube. Pt has since been hospitalized for wound infection and UTI. Pt lives with parents at home, has hospital bed and left chair, hoyer lift transfers, and power WC for mobility needs. Pt needs heavy physical assistance with ADL 2/2 BUE contractures and motor dysfunction.    Limitations Lifting;Standing;Walking;Writing;Reading;Sitting;House hold activities    How long can you sit comfortably? Patient reports uable to sit up long < 4 hours due to buttock discomfort    How long can you stand comfortably? Patient is currently unable to stand    How long can you walk comfortably? Patient is currently unable to walk    Diagnostic tests No recent relavent diagnostic tests    Patient Stated Goals I want to be able to possibly walk again    Currently in Pain? No/denies             Transfer from [facility] to bariatric mat table with head elevated with pillow with hoyer lift and 2X A for positioning of LE and with maintaining pt comfort.  -shoes off for transfer    Exercise/Activity Sets/Reps/Time/ Resistance Assistance Charge type Comments  Anterior lean with feet elevated 2 x 5 reps with 5 second holds Min near end of  sets  Neuro  -decreased head of table height to increase difficulty this session   Anterior lean with lateral weight shifting 3 x 5 reps to ea side  laterally for target distance  Neuro -Feet positioned on 8 inch step on floor to improve weigh bearing through Les bilaterally for remainder of exercises below with shoes donned bilaterally, lowered table to lowest height to improve weightbearing head of table elevated to standard distance from previous sessions  -Good weight shifting, cues for breathing throughout to prevent valsava maneuver   Ant lean with punches bilaterally  2 x 10 on ea side  SPT hands for targets  Neuro Increased difficulty with R UE as UE tends to fall into IR due to UE and shoulder weakness.Good weight shifting noted.   Ant lean with LE press downs into step  X 10 ea LE  Tactile cues  Neuro  -min contraction felt in LE bilaterally  To assist with increased weightbearing ability to LE and to strengthen and improve HS muscle activation   Hoyer loft from seated on treatment table to WC x1 3x A for positioning  Neuro  - 2 attempts to position pt comfortably in chair                                       Treatment Provided this session   Pt required occasional rest breaks due fatigue, PT was quick to ask when pt appeared to be fatiguing in order to prevent excessive fatigue.   Attempted sabina STS but unable to position strap in appripriate and comfortable position, pt and PT elected to safe this for further session when more assistance is available for porper positioning   Pt educated throughout session about proper posture and technique with exercises. Improved exercise technique, movement at target joints, use of target muscles after min to mod verbal, visual, tactile cues.  Note: Portions of this document were prepared using Dragon voice recognition software and although reviewed may contain unintentional dictation errors in syntax, grammar, or spelling.                         PT Education - 03/18/21 1012     Education Details Exercise technique, body mechanics    Person(s) Educated Patient    Methods Explanation;Demonstration    Comprehension Verbalized understanding;Returned demonstration              PT Short Term Goals - 03/15/21 1724       PT SHORT TERM GOAL #1   Title Pt will be independent with HEP in order to improve strength and balance in order to decrease fall risk and improve function at home and work.    Baseline 01/04/2021= No formal HEP in place; 12/12 no HEP in place    Time 6    Period Weeks    Status On-going    Target Date  02/15/21               PT Long Term Goals - 03/15/21 1725       PT LONG TERM GOAL #1   Title Patient will increase BLE gross strength by 1/2 muscle grade to improve functional strength for improved independence with potential gait, increased standing tolerance and increased ADL ability.    Baseline 01/04/2021- Patient presents with 1/5 to 3-/5 B LE strength with MMT; 12/12: goal partially met for  Left knee/hip    Time 12    Period Weeks    Status On-going    Target Date 03/29/21      PT LONG TERM GOAL #2   Title Patient will tolerate sitting unsupported demonstrating erect sitting posture for 15 minutes with CGA to demonstrate improved back extensor strength and improved sitting tolerance.    Baseline 01/04/2021- Patient confied to sitting in lift chair or electric power chair with back support and unable to sit upright without physical assistance; 12/12: tolerates <1 minutes upright unsupported sitting.    Time 12    Period Weeks    Status On-going    Target Date 03/29/21      PT LONG TERM GOAL #3   Title Patient will demonstrate ability to perform static standing in // bars > 2 min with Max Assist  without loss of balance and fair posture for improved overall strength for pre-gait and transfer activities.    Baseline 01/04/2021= Patient current uanble to stand- Dependent on hoyer or sit to stand lift for transfers.    Time 12    Period Weeks    Status On-going    Target Date 03/29/21      PT LONG TERM GOAL #4   Title Pt will improve FOTO score by 10 points or more demonstrating improved perceived functional ability    Baseline FOTO 7 on 10/17; 03/15/21: FOTO 12    Time 12    Period Weeks    Status On-going    Target Date 03/29/21                   Plan - 03/18/21 1015     Clinical Impression Statement Pt presents with excellent motivation for completion of PT program. Pt was able to demosntrate good core muscle activation in ability to sit up on mat table at  decreased angles. This continues to improve his ability to assist with positional changes throughout the day. Pt also progressed with his ability to put weight through his LE bilaterlly this session. Will continue to work on utilizing sabina sit to stand lift in future sessions as pt tolerates. Pt will continue to benefit from skilled PT intervention to improve his overall function, weight shifting and postiion change ability, LE strength, trunk strength and improve his overall QOL.    Personal Factors and Comorbidities Comorbidity 3+;Time since onset of injury/illness/exacerbation    Comorbidities CVA, diabetes, Seizures    Examination-Activity Limitations Bathing;Bed Mobility;Bend;Caring for Others;Carry;Dressing;Hygiene/Grooming;Lift;Locomotion Level;Reach Overhead;Self Feeding;Sit;Squat;Stairs;Stand;Transfers;Toileting    Examination-Participation Restrictions Cleaning;Community Activity;Driving;Laundry;Medication Management;Meal Prep;Occupation;Personal Finances;Shop;Yard Work;Volunteer    Stability/Clinical Decision Making Evolving/Moderate complexity    Rehab Potential Fair    PT Frequency 2x / week    PT Duration 12 weeks    PT Treatment/Interventions ADLs/Self Care Home Management;Cryotherapy;Electrical Stimulation;Moist Heat;Ultrasound;DME Instruction;Gait training;Stair training;Functional mobility training;Therapeutic exercise;Balance training;Patient/family education;Orthotic Fit/Training;Neuromuscular re-education;Wheelchair mobility training;Manual techniques;Passive range of motion;Dry needling;Energy conservation;Taping;Visual/perceptual remediation/compensation;Joint Manipulations    PT Next Visit Plan core strength, postural control    PT Home Exercise Plan Needs formal execution to meet goal    Consulted and Agree with Plan of Care Patient;Family member/caregiver    Family Member Consulted Dad             Patient will benefit from skilled therapeutic intervention in order to  improve the following deficits and impairments:  Abnormal gait, Decreased activity tolerance, Decreased balance, Decreased cognition, Cardiopulmonary status limiting activity, Decreased coordination, Decreased endurance, Decreased knowledge of use of DME, Decreased mobility, Decreased  range of motion, Decreased safety awareness, Decreased strength, Difficulty walking, Hypomobility, Impaired perceived functional ability, Impaired flexibility, Impaired UE functional use, Impaired vision/preception, Improper body mechanics, Postural dysfunction, Obesity  Visit Diagnosis: Muscle weakness (generalized)  Abnormality of gait and mobility  Difficulty in walking, not elsewhere classified  Other abnormalities of gait and mobility     Problem List Patient Active Problem List   Diagnosis Date Noted   Sepsis due to vancomycin resistant Enterococcus species ([place]) 06/06/2019   SIRS (systemic inflammatory response syndrome) ([place]) 06/05/2019   Acute lower UTI 06/05/2019   VRE (vancomycin-resistant Enterococci) infection 06/05/2019   Anemia 06/05/2019   Skin ulcer of sacrum with necrosis of muscle ([place])    Urinary retention    Type 2 diabetes mellitus without complication, with long-term current use of insulin (HCC)    Tachycardia    Lower extremity edema    Acute metabolic encephalopathy    Obstructive sleep apnea    Morbid obesity with BMI of 60.0-69.9, adult ([place])    Goals of care, counseling/discussion    Palliative care encounter    Sepsis ([place]) 04/27/2019   H/O insulin dependent diabetes mellitus 04/27/2019   History of CVA with residual deficit 04/27/2019   Seizure disorder ([place]) 04/27/2019   Decubitus ulcer of sacral region, stage 4 ([place]) 04/27/2019    Particia Lather, PT 03/18/2021, 10:40 AM  [place] [address] [place], [place], [address] Phone: [phone]   Fax:  [phone]  Name: Paul Hall MRN:  [id number] Date of Birth: May 18, 1995

## 2021-03-22 ENCOUNTER — Encounter: Payer: BC Managed Care – PPO | Admitting: Speech Pathology

## 2021-03-22 ENCOUNTER — Other Ambulatory Visit: Payer: Self-pay

## 2021-03-22 ENCOUNTER — Ambulatory Visit: Payer: BC Managed Care – PPO | Admitting: Occupational Therapy

## 2021-03-22 DIAGNOSIS — M6281 Muscle weakness (generalized): Secondary | ICD-10-CM

## 2021-03-22 DIAGNOSIS — R2689 Other abnormalities of gait and mobility: Secondary | ICD-10-CM | POA: Diagnosis not present

## 2021-03-22 DIAGNOSIS — I6930 Unspecified sequelae of cerebral infarction: Secondary | ICD-10-CM

## 2021-03-22 DIAGNOSIS — R2681 Unsteadiness on feet: Secondary | ICD-10-CM | POA: Diagnosis not present

## 2021-03-22 DIAGNOSIS — R278 Other lack of coordination: Secondary | ICD-10-CM | POA: Diagnosis not present

## 2021-03-22 DIAGNOSIS — R269 Unspecified abnormalities of gait and mobility: Secondary | ICD-10-CM | POA: Diagnosis not present

## 2021-03-22 DIAGNOSIS — G4733 Obstructive sleep apnea (adult) (pediatric): Secondary | ICD-10-CM | POA: Diagnosis not present

## 2021-03-22 DIAGNOSIS — R262 Difficulty in walking, not elsewhere classified: Secondary | ICD-10-CM | POA: Diagnosis not present

## 2021-03-23 DIAGNOSIS — G4733 Obstructive sleep apnea (adult) (pediatric): Secondary | ICD-10-CM | POA: Diagnosis not present

## 2021-03-23 NOTE — Therapy (Signed)
[facility] MAIN [facility] SERVICES [address] [place], [place], [address] Phone: [phone]   Fax:  [phone]   Occupational Therapy Recertification Note    Patient Details  Name: Paul Hall MRN: [id number] Date of Birth: 1995/07/16 Referring Provider (OT): Lenise Herald   Encounter Date: 03/22/2021   OT End of Session - 03/23/21 0814     Visit Number 21    Number of Visits 48    Date for OT Re-Evaluation 06/15/21    OT Start Time 1407    OT Stop Time 1447    OT Time Calculation (min) 40 min    Equipment Utilized During Treatment tilt in space power wc    Activity Tolerance Patient tolerated treatment well    Behavior During Therapy WFL for tasks assessed/performed             Past Medical History:  Diagnosis Date   Diabetes mellitus (HCC)    Hypertension    Stroke [facility])     Past Surgical History:  Procedure Laterality Date   IVC FILTER PLACEMENT ([facility] HX)     LEG SURGERY     PEG TUBE PLACEMENT     TRACHEOSTOMY      There were no vitals filed for this visit.   Subjective Assessment - 03/22/21 1407     Subjective  Pt reports continues to be limited by low vision - requires objects to be <6 inches from face to identify small objects.    Patient is accompanied by: Family member    Pertinent History Pt. is a 25 y.o. male who was hospitalized, and diagnosed with COVID-19, sustained a CVA with resultant quadreplegia in 01/2019. Pt. was hospitalized with VRE  UTI. Pt. PMHx includes: urinary retention, Seizure Disorder, Obstructive Sleep Disorder, DM Type II, and Morbid obesity. Pt. resides at home with his parents.    Limitations BUE, and LE limitations with ROM, strength, motor control, and [facility] skills.    Patient Stated Goals To improve his arms, and hands.    Currently in Pain? No/denies    Pain Onset More than a month ago    Pain Onset More than a month ago                [facility] OT Assessment -  03/23/21 0001       Observation/Other Assessments   Focus on Therapeutic Outcomes (FOTO)  43      AROM   Overall AROM Comments RUE shoulder flexion: 93(110) abduction: 80(95), elbow flexion 140(140), elbow extension -50(-45), wrist flexion 45(50) LUE: shoulder flexion 115(125), abduction 120, elbow flexion 140, elbow extension: -30, wrist flexion: 20, wrist extension: 15      Hand Function   Right Hand Grip (lbs) 3    Right Hand Lateral Pinch 4 lbs    Left Hand Grip (lbs) 2    Left Hand Lateral Pinch 3 lbs            Self Care Pt instructed in adapted dressing strategies. Trialed grasping coin from tabletop to simulate grasping ADL items, requires assist to locate item and place item between thumb and palm. Pt instructed on low vision strategies including color contrast, minimizing visual clutter, and increasing size of items.   Therapeutic Exercise Pt instructed in HEP (handout provided) for BUE exercises using wrist weights. Pt completed AROM/PROM for measurements as shown above.     Measurements were obtained and goals were reviewed with the patient. Pt's FOTO score has improved  from 41 to 43. Pt has made progress with RUE AROM shoulder flexion/abduction, elbow flexion/extension, and wrist flexion. Pt previously was unable to sustain grip for use of dynamometer, pt now measuring L grip strength 2#, and R grip strength 3#. Pt has initiated BUE HEP and is completing at home with assistance from family. Pt continues to present with limited R shoulder abduction and BUE FMC deficits - unable to grasp coin from tabletop this date. Patient continues to work on normalizing tone in the bilateral upper extremities in order to improve range of motion, and facilitate active range of motion in order to increase engagement in ADLs and IADL tasks.               OT Education - 03/23/21 [phone]     Education Details BUE HEP and low vision strategies    Person(s) Educated Patient     Methods Explanation;Handout    Comprehension Verbalized understanding;Verbal cues required;Need further instruction                 OT Long Term Goals - 03/22/21 1437       OT LONG TERM GOAL #1   Title Pt. will improve FOTO score by 5 points for clinically significant improvements during ADLs, and IADLs,    Baseline 21st: FOTO 43. 10th Visit: FOTO score 42 Eval: FOTO score: 41    Time 12    Period Weeks    Status Revised    Target Date 06/14/21      OT LONG TERM GOAL #2   Title Pt. will improve right shoulder flexion by 10 degrees to assist caregivers with UE dressing    Baseline Pt. continues to require assist with UE dressing secondary to limited ROM.    Time 12    Period Weeks    Status Achieved    Target Date 03/24/21      OT LONG TERM GOAL #3   Title Pt. will improve right Abduction by 10 degrees to assist caregivers with underarm hygiene care.    Baseline Eval: AROM abduction RUE 76. Pt. continues to present with limited bilateral abduction. 21st: AROM abduction RUE 80.    Time 12    Period Weeks    Status On-going    Target Date 06/14/21      OT LONG TERM GOAL #4   Title Pt. will demonstrate visual compensatory strategies for navigating through his environment.    Baseline 20t: pt continues to require assist to navigate environment, minimal knowledge of strategies. Eval: Limited    Time 12    Period Weeks    Status On-going    Target Date 06/14/21      OT LONG TERM GOAL #5   Title Pt. will demonstrate visual compensatory strategies for tabletop tasks within his near space.    Baseline Pt. continues to present with limited vision. 21st: Pt can identify objects on tabletop ~waterbottle sized, improves with contrasting colours, cannot ID small onjects    Time 12    Period Weeks    Status On-going    Target Date 06/14/21      Long Term Additional Goals   Additional Long Term Goals Yes      OT LONG TERM GOAL #6   Title Pt. will perform hand to mouth  patterns with min A in preparation for self-feeding.    Baseline Pt. is able to perfrom hand to mouth patterns, however reports weakness in his hand  limits a secure grasp on the  utensi Pt. requires assist to perform. Eval: Pt. has difficulty    Time 12    Period Weeks    Status Achieved    Target Date 03/24/21      OT LONG TERM GOAL #7   Title Pt. will perform one step self-grooming tasks with minA.    Baseline Pt. has difficulty sustaining the UEs in elevation for the duration required to perfrom self-grooming tasks thoroughly.Eval: pt. has difficulty    Time 12    Status Achieved    Target Date 03/24/21      OT LONG TERM GOAL #8   Title Pt will complete UB dressing tasks with SETUP and assist to thread over hands only    Baseline 21st: Pt requires MIN A    Time 12    Period Weeks    Status New    Target Date 06/14/21      OT LONG TERM GOAL  #9   TITLE Pt will improve B grip strength by 5# to assist in handling ADL items    Baseline 21st: R grip 3#, L grip 2#    Time 12    Period Weeks    Status New    Target Date 06/14/21      OT LONG TERM GOAL  #10   TITLE Pt will improve B lateral pinch strength by 3# to assist in UBD.    Baseline 21st: R lateral pinch 4#, L lateral pinch 3#    Time 12    Period Weeks    Status New    Target Date 06/14/21                   Plan - 03/23/21 0815     Clinical Impression Statement Measurements were obtained and goals were reviewed with the patient. Pt has made progress with RUE AROM shoulder flexion/abduction, elbow flexion/extension, and wrist flexion. Pt previously was unable to sustain grip for use of dynamometer, pt now measuring L grip strength 2#, and R grip strength 3#. Pt has initiated BUE HEP and is completing at home with assistance from family. Pt continues to present with limited R shoulder abduction and BUE FMC deficits - unable to grasp coin from tabletop this date. Patient continues to work on normalizing tone in the  bilateral upper extremities in order to improve range of motion, and facilitate active range of motion in order to increase engagement in ADLs and IADL tasks.   OT Occupational Profile and History Detailed Assessment- Review of Records and additional review of physical, cognitive, psychosocial history related to current functional performance    Occupational performance deficits (Please refer to evaluation for details): ADL's;IADL's    Body Structure / Function / Physical Skills ADL;FMC;ROM;Dexterity;IADL;Endurance;Obesity;Strength;UE functional use    Rehab Potential Good    Clinical Decision Making Multiple treatment options, significant modification of task necessary    Comorbidities Affecting Occupational Performance: Presence of comorbidities impacting occupational performance    Modification or Assistance to Complete Evaluation  Max significant modification of tasks or assist is necessary to complete    OT Frequency 2x / week    OT Duration 12 weeks    OT Treatment/Interventions Self-care/ADL training;Therapeutic exercise;Neuromuscular education;Visual/perceptual remediation/compensation;Patient/family education;Therapeutic activities;DME and/or AE instruction;Passive range of motion;Moist Heat;Electrical Stimulation    Consulted and Agree with Plan of Care Patient             Patient will benefit from skilled therapeutic intervention in order to improve the following deficits and impairments:  Body Structure / Function / Physical Skills: ADL, FMC, ROM, Dexterity, IADL, Endurance, Obesity, Strength, UE functional use       Visit Diagnosis: Muscle weakness (generalized)  Other lack of coordination  History of CVA with residual deficit    Problem List Patient Active Problem List   Diagnosis Date Noted   Sepsis due to vancomycin resistant Enterococcus species (HCC) 06/06/2019   SIRS (systemic inflammatory response syndrome) (HCC) 06/05/2019   Acute lower UTI 06/05/2019    VRE (vancomycin-resistant Enterococci) infection 06/05/2019   Anemia 06/05/2019   Skin ulcer of sacrum with necrosis of muscle (HCC)    Urinary retention    Type 2 diabetes mellitus without complication, with long-term current use of insulin (HCC)    Tachycardia    Lower extremity edema    Acute metabolic encephalopathy    Obstructive sleep apnea    Morbid obesity with BMI of 60.0-69.9, adult (HCC)    Goals of care, counseling/discussion    Palliative care encounter    Sepsis (HCC) 04/27/2019   H/O insulin dependent diabetes mellitus 04/27/2019   History of CVA with residual deficit 04/27/2019   Seizure disorder (HCC) 04/27/2019   Decubitus ulcer of sacral region, stage 4 (HCC) 04/27/2019     Kathie Dike, M.S. OTR/L  03/23/21, 8:57 AM  ascom [phone]  [facility] MAIN [facility] SERVICES [address] [place], [place], [address] Phone: [phone]   Fax:  [phone]  Name: Paul Hall MRN: [id number] Date of Birth: 02/15/1996

## 2021-03-24 ENCOUNTER — Ambulatory Visit: Payer: BC Managed Care – PPO | Admitting: Physical Therapy

## 2021-03-24 ENCOUNTER — Ambulatory Visit: Payer: BC Managed Care – PPO

## 2021-03-24 ENCOUNTER — Other Ambulatory Visit: Payer: Self-pay

## 2021-03-24 DIAGNOSIS — R269 Unspecified abnormalities of gait and mobility: Secondary | ICD-10-CM

## 2021-03-24 DIAGNOSIS — I6930 Unspecified sequelae of cerebral infarction: Secondary | ICD-10-CM | POA: Diagnosis not present

## 2021-03-24 DIAGNOSIS — R262 Difficulty in walking, not elsewhere classified: Secondary | ICD-10-CM | POA: Diagnosis not present

## 2021-03-24 DIAGNOSIS — M6281 Muscle weakness (generalized): Secondary | ICD-10-CM

## 2021-03-24 DIAGNOSIS — R2689 Other abnormalities of gait and mobility: Secondary | ICD-10-CM | POA: Diagnosis not present

## 2021-03-24 DIAGNOSIS — R2681 Unsteadiness on feet: Secondary | ICD-10-CM | POA: Diagnosis not present

## 2021-03-24 DIAGNOSIS — G4733 Obstructive sleep apnea (adult) (pediatric): Secondary | ICD-10-CM | POA: Diagnosis not present

## 2021-03-24 DIAGNOSIS — L8994 Pressure ulcer of unspecified site, stage 4: Secondary | ICD-10-CM | POA: Diagnosis not present

## 2021-03-24 DIAGNOSIS — I6389 Other cerebral infarction: Secondary | ICD-10-CM | POA: Diagnosis not present

## 2021-03-24 DIAGNOSIS — R278 Other lack of coordination: Secondary | ICD-10-CM | POA: Diagnosis not present

## 2021-03-25 NOTE — Addendum Note (Signed)
Addended by: Thresa Ross B on: 03/25/2021 11:58 AM   Modules accepted: Orders

## 2021-03-25 NOTE — Therapy (Signed)
[facility] MAIN [facility] SERVICES [address] [place], [place], [address] Phone: [phone]   Fax:  [phone]  Occupational Therapy Treatment  Patient Details  Name: Paul Hall MRN: [id number] Date of Birth: October 15, 1995 Referring Provider (OT): Lenise Herald   Encounter Date: 03/24/2021   OT End of Session - 03/25/21 1219     Visit Number 22    Number of Visits 48    Date for OT Re-Evaluation 06/15/21    Authorization Type Progress report period starting 12/15/2020    OT Start Time 1652    OT Stop Time 1734    OT Time Calculation (min) 42 min    Equipment Utilized During Treatment tilt in space power wc    Activity Tolerance Patient tolerated treatment well    Behavior During Therapy WFL for tasks assessed/performed             Past Medical History:  Diagnosis Date   Diabetes mellitus (HCC)    Hypertension    Stroke [facility])     Past Surgical History:  Procedure Laterality Date   IVC FILTER PLACEMENT ([facility] HX)     LEG SURGERY     PEG TUBE PLACEMENT     TRACHEOSTOMY      There were no vitals filed for this visit.   Subjective Assessment - 03/24/21 1217     Subjective  Pt reporting that he is starting to see colors and was able to accurately identify OT's pant and shirt color.    Patient is accompanied by: Family member    Pertinent History Pt. is a 25 y.o. male who was hospitalized, and diagnosed with COVID-19, sustained a CVA with resultant quadreplegia in 01/2019. Pt. was hospitalized with VRE  UTI. Pt. PMHx includes: urinary retention, Seizure Disorder, Obstructive Sleep Disorder, DM Type II, and Morbid obesity. Pt. resides at home with his parents.    Limitations BUE, and LE limitations with ROM, strength, motor control, and [facility] skills.    Patient Stated Goals To improve his arms, and hands.    Currently in Pain? No/denies    Pain Score 0-No pain    Pain Onset More than a month ago    Pain Onset More than a  month ago            Occupational Therapy Treatment: Estim: 16 min (8 min each arm) Pt. tolerated neuromuscular estim  for 8 min., cycle time 10/10,  duty cycle 50%. Applied to the right wrist, and digit extensors, followed by  8 min. Applied to the left wrist, and digit extensors with the intensity set to 38, and constant monitoring 100% of the time. Manual assist was required for wrist and digit extension with each rep. To hold the position.  Pt worked gripping OT's hand during cycle off time, and active release of grip during ramp up.    Manual Therapy: Passive stretching to BUEs completed for shoulder flex/abd, horiz abd/add/ER, elbow flex/ext, wrist and digit ext, digit abd, working to maximize BUE ROM for engagement of BUEs into self care tasks.     Response to Treatment: See Plan/clinical impression below.    OT Education - 03/24/21 1218     Education Details grasp/release functioning bilat hands    Person(s) Educated Patient    Methods Verbal cues;Tactile cues    Comprehension Verbalized understanding;Verbal cues required;Need further instruction                 OT Long Term Goals -  03/22/21 1437       OT LONG TERM GOAL #1   Title Pt. will improve FOTO score by 5 points for clinically significant improvements during ADLs, and IADLs,    Baseline 21st: FOTO 43. 10th Visit: FOTO score 42 Eval: FOTO score: 41    Time 12    Period Weeks    Status Revised    Target Date 06/14/21      OT LONG TERM GOAL #2   Title Pt. will improve right shoulder flexion by 10 degrees to assist caregivers with UE dressing    Baseline Pt. continues to require assist with UE dressing secondary to limited ROM.    Time 12    Period Weeks    Status Achieved    Target Date 03/24/21      OT LONG TERM GOAL #3   Title Pt. will improve right Abduction by 10 degrees to assist caregivers with underarm hygiene care.    Baseline Eval: AROM abduction RUE 76. Pt. continues to present with  limited bilateral abduction. 21st: AROM abduction RUE 80.    Time 12    Period Weeks    Status On-going    Target Date 06/14/21      OT LONG TERM GOAL #4   Title Pt. will demonstrate visual compensatory strategies for navigating through his environment.    Baseline 20t: pt continues to require assist to navigate environment, minimal knowledge of strategies. Eval: Limited    Time 12    Period Weeks    Status On-going    Target Date 06/14/21      OT LONG TERM GOAL #5   Title Pt. will demonstrate visual compensatory strategies for tabletop tasks within his near space.    Baseline Pt. continues to present with limited vision. 21st: Pt can identify objects on tabletop ~waterbottle sized, improves with contrasting colours, cannot ID small onjects    Time 12    Period Weeks    Status On-going    Target Date 06/14/21      Long Term Additional Goals   Additional Long Term Goals Yes      OT LONG TERM GOAL #6   Title Pt. will perform hand to mouth patterns with min A in preparation for self-feeding.    Baseline Pt. is able to perfrom hand to mouth patterns, however reports weakness in his hand  limits a secure grasp on the utensi Pt. requires assist to perform. Eval: Pt. has difficulty    Time 12    Period Weeks    Status Achieved    Target Date 03/24/21      OT LONG TERM GOAL #7   Title Pt. will perform one step self-grooming tasks with minA.    Baseline Pt. has difficulty sustaining the UEs in elevation for the duration required to perfrom self-grooming tasks thoroughly.Eval: pt. has difficulty    Time 12    Status Achieved    Target Date 03/24/21      OT LONG TERM GOAL #8   Title Pt will complete UB dressing tasks with SETUP and assist to thread over hands only    Baseline 21st: Pt requires MIN A    Time 12    Period Weeks    Status New    Target Date 06/14/21      OT LONG TERM GOAL  #9   TITLE Pt will improve B grip strength by 5# to assist in handling ADL items    Baseline  21st: R  grip 3#, L grip 2#    Time 12    Period Weeks    Status New    Target Date 06/14/21      OT LONG TERM GOAL  #10   TITLE Pt will improve B lateral pinch strength by 3# to assist in UBD.    Baseline 21st: R lateral pinch 4#, L lateral pinch 3#    Time 12    Period Weeks    Status New    Target Date 06/14/21              Plan - 03/24/21 1241     Clinical Impression Statement Patient continues to work on normalizing tone in the bilateral upper extremities in order to improve range of motion, and facilitate active range of motion in order to increase engagement in ADLs and IADL tasks.    OT Occupational Profile and History Detailed Assessment- Review of Records and additional review of physical, cognitive, psychosocial history related to current functional performance    Occupational performance deficits (Please refer to evaluation for details): ADL's;IADL's    Body Structure / Function / Physical Skills ADL;FMC;ROM;Dexterity;IADL;Endurance;Obesity;Strength;UE functional use    Rehab Potential Good    Clinical Decision Making Multiple treatment options, significant modification of task necessary    Comorbidities Affecting Occupational Performance: Presence of comorbidities impacting occupational performance    Modification or Assistance to Complete Evaluation  Max significant modification of tasks or assist is necessary to complete    OT Frequency 2x / week    OT Duration 12 weeks    OT Treatment/Interventions Self-care/ADL training;Therapeutic exercise;Neuromuscular education;Visual/perceptual remediation/compensation;Patient/family education;Therapeutic activities;DME and/or AE instruction;Passive range of motion;Moist Heat;Electrical Stimulation    Consulted and Agree with Plan of Care Patient             Patient will benefit from skilled therapeutic intervention in order to improve the following deficits and impairments:   Body Structure / Function / Physical Skills:  ADL, FMC, ROM, Dexterity, IADL, Endurance, Obesity, Strength, UE functional use       Visit Diagnosis: Muscle weakness (generalized)  Other lack of coordination  History of CVA with residual deficit    Problem List Patient Active Problem List   Diagnosis Date Noted   Sepsis due to vancomycin resistant Enterococcus species (HCC) 06/06/2019   SIRS (systemic inflammatory response syndrome) (HCC) 06/05/2019   Acute lower UTI 06/05/2019   VRE (vancomycin-resistant Enterococci) infection 06/05/2019   Anemia 06/05/2019   Skin ulcer of sacrum with necrosis of muscle (HCC)    Urinary retention    Type 2 diabetes mellitus without complication, with long-term current use of insulin (HCC)    Tachycardia    Lower extremity edema    Acute metabolic encephalopathy    Obstructive sleep apnea    Morbid obesity with BMI of 60.0-69.9, adult (HCC)    Goals of care, counseling/discussion    Palliative care encounter    Sepsis (HCC) 04/27/2019   H/O insulin dependent diabetes mellitus 04/27/2019   History of CVA with residual deficit 04/27/2019   Seizure disorder (HCC) 04/27/2019   Decubitus ulcer of sacral region, stage 4 (HCC) 04/27/2019   Danelle Earthly, MS, OTR/L  O[employer] Dials, OT 03/25/2021, 12:41 PM  [facility] [facility] MAIN [facility] SERVICES [address] [place], [place], [address] Phone: [phone]   Fax:  [phone]  Name: Paul Hall MRN: [id number] Date of Birth: 17-Feb-1996

## 2021-03-25 NOTE — Therapy (Addendum)
[place] MAIN [facility] SERVICES [address] [place], [place], [address] Phone: [phone]   Fax:  [phone]  Physical Therapy Treatment/ RECERT Days of reporting period:  01/04/21-03/24/21  Patient Details  Name: Paul Hall MRN: [id number] Date of Birth: 1996-02-21 Referring Provider (PT): Collene Mares, [place]   Encounter Date: 03/24/2021   PT End of Session - 03/25/21 0833     Visit Number 12    Number of Visits 25    Date for PT Re-Evaluation 03/29/21    Authorization Type [employer] and [employer] Medicaid- Need auth past 12th visit    Authorization Time Period 01/04/21-03/29/21    Authorization - Visit Number 11    Authorization - Number of Visits 12    Progress Note Due on Visit 20    PT Start Time 9480    PT Stop Time 1650    PT Time Calculation (min) 49 min    Equipment Utilized During Treatment Other (comment)   Hoyer lift   Activity Tolerance Patient tolerated treatment well;Treatment limited secondary to medical complications (Comment)   queasiness/nausea   Behavior During Therapy WFL for tasks assessed/performed             Past Medical History:  Diagnosis Date   Diabetes mellitus ([place])    Hypertension    Stroke [facility])     Past Surgical History:  Procedure Laterality Date   IVC FILTER PLACEMENT ([facility] HX)     LEG SURGERY     PEG TUBE PLACEMENT     TRACHEOSTOMY      There were no vitals filed for this visit.   Subjective Assessment - 03/25/21 0831     Subjective Pt reports he has been doing well since last visit. Is very motivatioed to continue to work toward improving his strength and function.    Patient is accompained by: Family member    Pertinent History Paul Hall is a 1yoM who presents with severe weakness, quadriparesis, altered sensorium, and visual impairment s/p critical illness and prolonged hospitalization. Pt hospitalized in October 2020 with ARDS 2/2 COVID19 infection. Pt sustained a  complex and lengthy hospitalization which included tracheostomy, prolonged sedation, ECMO. In this period pt sustained CVA and SDH. Pt has now been liberated from tracheostomy and G-tube. Pt has since been hospitalized for wound infection and UTI. Pt lives with parents at home, has hospital bed and left chair, hoyer lift transfers, and power WC for mobility needs. Pt needs heavy physical assistance with ADL 2/2 BUE contractures and motor dysfunction.    Limitations Lifting;Standing;Walking;Writing;Reading;Sitting;House hold activities    How long can you sit comfortably? Patient reports uable to sit up long < 4 hours due to buttock discomfort    How long can you stand comfortably? Patient is currently unable to stand    How long can you walk comfortably? Patient is currently unable to walk    Diagnostic tests No recent relavent diagnostic tests    Patient Stated Goals I want to be able to possibly walk again             See previous progress note (03/15/21 visit) for details regarding progress toward goals    Transfer from [facility] to bariatric mat table with head elevated with pillow with hoyer lift and 2X A for positioning of LE and with maintaining pt comfort.  -shoes on for transfer    Exercise/Activity Sets/Reps/Time/ Resistance Assistance Charge type Comments  Anterior lean with feet elevated 2=x1 reps with 5  second holds Min near end of sets  Neuro  -decreased head of table height to increase difficulty this session   Anterior lean with lateral weight shifting 2x8 reps to ea side  laterally for target distance  Neuro -Feet positioned on 8 inch step on floor to improve weigh bearing through Les bilaterally for remainder of exercises below with shoes donned bilaterally, lowered table to lowest height to improve weightbearing head of table elevated to standard distance from previous sessions  -Good weight shifting, cues for breathing throughout to prevent valsava maneuver   Ant lean with  punches bilaterally  2 x 10 on ea side  PT hands for targets Neuro Increased difficulty with R UE as UE tends to fall into IR due to UE and shoulder weakness.Good weight shifting noted.   Ant lean with LE press downs into step  X 10 ea LE  Tactile cues  Neuro  -min contraction felt in LE bilaterally  To assist with increased weightbearing ability to LE and to strengthen and improve HS muscle activation   Hoyer loft from seated on treatment table to [facility] x1 3x A for positioning  Neuro  - 2 attempts to position pt comfortably in chair   Sabina STS X 1  3XA for positioning and lift operation neuro -able to stand with lift assistance for approximately 45 sec this session.  Took foot plates off of lift to improve pt comfort and ability to utilize device, improvement in tolerance with this strategy compared to previous use of sabina lift.  -Had pt peform ant trunk lean prior to utilizing lift   LAQ Seated marching 2 x 10 ea LE  2x10 ea LE   Neuro  First set of each performed with pt leaning back, second set performed leaning anteriorly without trunk support to improve core muscle activation and balance. Assistance min/mod for muscle activation and full ROM on the right, no A for the left.                            Rolling for positioning of hoyer lift sling X 2 each direction 2 x A on ea Side of patient  Neuro  -cues for pt to assist with rolling and maintaining position. Lowered head of table to nearly flat to assist with positioning.     Treatment Provided this session   Pt required occasional rest breaks due fatigue, however, rest breakes were shorted in duration and less frequent this session. PT was quick to ask when pt appeared to be fatiguing in order to prevent excessive fatigue.  Pt educated throughout session about proper posture and technique with exercises. Improved exercise technique, movement at target joints, use of target muscles after min to mod verbal, visual, tactile cues.  Note:  Portions of this document were prepared using Dragon voice recognition software and although reviewed may contain unintentional dictation errors in syntax, grammar, or spelling.                         PT Education - 03/25/21 2620     Education Details Exercsie technique    Person(s) Educated Patient    Methods Explanation;Demonstration    Comprehension Verbalized understanding;Returned demonstration              PT Short Term Goals - 03/15/21 1724       PT SHORT TERM GOAL #1   Title Pt will be independent with  HEP in order to improve strength and balance in order to decrease fall risk and improve function at home and work.    Baseline 01/04/2021= No formal HEP in place; 12/12 no HEP in place    Time 6    Period Weeks    Status On-going    Target Date 02/15/21               PT Long Term Goals - 03/15/21 1725       PT LONG TERM GOAL #1   Title Patient will increase BLE gross strength by 1/2 muscle grade to improve functional strength for improved independence with potential gait, increased standing tolerance and increased ADL ability.    Baseline 01/04/2021- Patient presents with 1/5 to 3-/5 B LE strength with MMT; 12/12: goal partially met for Left knee/hip    Time 12    Period Weeks    Status On-going    Target Date 03/29/21      PT LONG TERM GOAL #2   Title Patient will tolerate sitting unsupported demonstrating erect sitting posture for 15 minutes with CGA to demonstrate improved back extensor strength and improved sitting tolerance.    Baseline 01/04/2021- Patient confied to sitting in lift chair or electric power chair with back support and unable to sit upright without physical assistance; 12/12: tolerates <1 minutes upright unsupported sitting.    Time 12    Period Weeks    Status On-going    Target Date 03/29/21      PT LONG TERM GOAL #3   Title Patient will demonstrate ability to perform static standing in // bars > 2 min with Max  Assist  without loss of balance and fair posture for improved overall strength for pre-gait and transfer activities.    Baseline 01/04/2021= Patient current uanble to stand- Dependent on hoyer or sit to stand lift for transfers.    Time 12    Period Weeks    Status On-going    Target Date 03/29/21      PT LONG TERM GOAL #4   Title Pt will improve FOTO score by 10 points or more demonstrating improved perceived functional ability    Baseline FOTO 7 on 10/17; 03/15/21: FOTO 12    Time 12    Period Weeks    Status On-going    Target Date 03/29/21                   Plan - 03/25/21 4360     Clinical Impression Statement Pt presents with excellent motivation for completion of PT program. Pt was able to demosntrate good core muscle activation in ability to sit up on mat table this session and also demonstrated improved endurance with standing tolerance as well as sitting solerance unsupported. This continues to improve his ability to assist with positional changes throughout the day.  Will continue to work on utilizing sabina sit to stand lift in future sessions as pt tolerates. Pt will continue to benefit from skilled PT intervention to improve his overall function, weight shifting and postiion change ability, LE strength, trunk strength and improve his overall QOL.    Personal Factors and Comorbidities Comorbidity 3+;Time since onset of injury/illness/exacerbation    Comorbidities CVA, diabetes, Seizures    Examination-Activity Limitations Bathing;Bed Mobility;Bend;Caring for Others;Carry;Dressing;Hygiene/Grooming;Lift;Locomotion Level;Reach Overhead;Self Feeding;Sit;Squat;Stairs;Stand;Transfers;Toileting    Examination-Participation Restrictions Cleaning;Community Activity;Driving;Laundry;Medication Management;Meal Prep;Occupation;Personal Finances;Shop;Yard Work;Volunteer    Stability/Clinical Decision Making Evolving/Moderate complexity    Rehab Potential Fair    PT Frequency 2x /  week    PT Duration 12 weeks    PT Treatment/Interventions ADLs/Self Care Home Management;Cryotherapy;Electrical Stimulation;Moist Heat;Ultrasound;DME Instruction;Gait training;Stair training;Functional mobility training;Therapeutic exercise;Balance training;Patient/family education;Orthotic Fit/Training;Neuromuscular re-education;Wheelchair mobility training;Manual techniques;Passive range of motion;Dry needling;Energy conservation;Taping;Visual/perceptual remediation/compensation;Joint Manipulations    PT Next Visit Plan core strength, postural control    PT Home Exercise Plan Needs formal execution to meet goal    Consulted and Agree with Plan of Care Patient;Family member/caregiver    Family Member Consulted Dad             Patient will benefit from skilled therapeutic intervention in order to improve the following deficits and impairments:  Abnormal gait, Decreased activity tolerance, Decreased balance, Decreased cognition, Cardiopulmonary status limiting activity, Decreased coordination, Decreased endurance, Decreased knowledge of use of DME, Decreased mobility, Decreased range of motion, Decreased safety awareness, Decreased strength, Difficulty walking, Hypomobility, Impaired perceived functional ability, Impaired flexibility, Impaired UE functional use, Impaired vision/preception, Improper body mechanics, Postural dysfunction, Obesity  Visit Diagnosis: Muscle weakness (generalized)  Abnormality of gait and mobility  Difficulty in walking, not elsewhere classified     Problem List Patient Active Problem List   Diagnosis Date Noted   Sepsis due to vancomycin resistant Enterococcus species ([place]) 06/06/2019   SIRS (systemic inflammatory response syndrome) ([place]) 06/05/2019   Acute lower UTI 06/05/2019   VRE (vancomycin-resistant Enterococci) infection 06/05/2019   Anemia 06/05/2019   Skin ulcer of sacrum with necrosis of muscle ([place])    Urinary retention    Type 2 diabetes  mellitus without complication, with long-term current use of insulin ([facility])    Tachycardia    Lower extremity edema    Acute metabolic encephalopathy    Obstructive sleep apnea    Morbid obesity with BMI of 60.0-69.9, adult ([facility])    Goals of care, counseling/discussion    Palliative care encounter    Sepsis ([place]) 04/27/2019   H/O insulin dependent diabetes mellitus 04/27/2019   History of CVA with residual deficit 04/27/2019   Seizure disorder ([place]) 04/27/2019   Decubitus ulcer of sacral region, stage 4 ([place]) 04/27/2019    Particia Lather, PT 03/25/2021, 8:43 AM  [place] [address] [place], [place], [address] Phone: [phone]   Fax:  [phone]  Name: Paul Hall MRN: [id number] Date of Birth: 12-29-1995

## 2021-03-30 ENCOUNTER — Encounter: Payer: BC Managed Care – PPO | Admitting: Speech Pathology

## 2021-03-30 ENCOUNTER — Ambulatory Visit: Payer: BC Managed Care – PPO | Admitting: Occupational Therapy

## 2021-03-30 DIAGNOSIS — I6389 Other cerebral infarction: Secondary | ICD-10-CM | POA: Diagnosis not present

## 2021-03-30 DIAGNOSIS — G4733 Obstructive sleep apnea (adult) (pediatric): Secondary | ICD-10-CM | POA: Diagnosis not present

## 2021-03-30 DIAGNOSIS — L8994 Pressure ulcer of unspecified site, stage 4: Secondary | ICD-10-CM | POA: Diagnosis not present

## 2021-04-01 ENCOUNTER — Ambulatory Visit: Payer: BC Managed Care – PPO | Admitting: Physical Therapy

## 2021-04-01 ENCOUNTER — Encounter: Payer: BC Managed Care – PPO | Admitting: Speech Pathology

## 2021-04-06 ENCOUNTER — Encounter: Payer: Self-pay | Admitting: Occupational Therapy

## 2021-04-06 ENCOUNTER — Encounter: Payer: BC Managed Care – PPO | Admitting: Speech Pathology

## 2021-04-06 ENCOUNTER — Ambulatory Visit: Payer: BC Managed Care – PPO | Admitting: Physical Therapy

## 2021-04-06 ENCOUNTER — Ambulatory Visit: Payer: BC Managed Care – PPO | Attending: Physician Assistant | Admitting: Occupational Therapy

## 2021-04-06 ENCOUNTER — Other Ambulatory Visit: Payer: Self-pay

## 2021-04-06 DIAGNOSIS — R262 Difficulty in walking, not elsewhere classified: Secondary | ICD-10-CM | POA: Diagnosis not present

## 2021-04-06 DIAGNOSIS — M6281 Muscle weakness (generalized): Secondary | ICD-10-CM

## 2021-04-06 DIAGNOSIS — R2681 Unsteadiness on feet: Secondary | ICD-10-CM

## 2021-04-06 DIAGNOSIS — R269 Unspecified abnormalities of gait and mobility: Secondary | ICD-10-CM

## 2021-04-06 DIAGNOSIS — R278 Other lack of coordination: Secondary | ICD-10-CM | POA: Insufficient documentation

## 2021-04-06 DIAGNOSIS — R2689 Other abnormalities of gait and mobility: Secondary | ICD-10-CM | POA: Insufficient documentation

## 2021-04-06 DIAGNOSIS — I6930 Unspecified sequelae of cerebral infarction: Secondary | ICD-10-CM | POA: Insufficient documentation

## 2021-04-06 NOTE — Therapy (Signed)
[place] MAIN [facility] SERVICES [address] [place], [place], [address] Phone: [phone]   Fax:  [phone]  Physical Therapy Treatment  Patient Details  Name: Paul Hall MRN: [id number] Date of Birth: July 01, 1995 Referring Provider (PT): Collene Mares, PA-C   Encounter Date: 04/06/2021   PT End of Session - 04/06/21 1547     Visit Number 13    Number of Visits 25    Date for PT Re-Evaluation 03/29/21    Authorization Type [employer] and [employer] Medicaid- Need auth past 12th visit    Authorization Time Period 01/04/21-03/29/21    Authorization - Visit Number 11    Authorization - Number of Visits 12    Progress Note Due on Visit 20    PT Start Time 7622    PT Stop Time 1534    PT Time Calculation (min) 43 min    Equipment Utilized During Treatment Other (comment)   Hoyer lift   Activity Tolerance Patient tolerated treatment well;Treatment limited secondary to medical complications (Comment)   queasiness/nausea   Behavior During Therapy WFL for tasks assessed/performed             Past Medical History:  Diagnosis Date   Diabetes mellitus ([place])    Hypertension    Stroke [facility])     Past Surgical History:  Procedure Laterality Date   IVC FILTER PLACEMENT ([facility] HX)     LEG SURGERY     PEG TUBE PLACEMENT     TRACHEOSTOMY      There were no vitals filed for this visit.   Subjective Assessment - 04/06/21 1546     Subjective Pt reports he has been doing well since last visit. Reports his holidays and his brthday were good.    Patient is accompained by: Family member    Pertinent History Keeyon Hall is a 29yoM who presents with severe weakness, quadriparesis, altered sensorium, and visual impairment s/p critical illness and prolonged hospitalization. Pt hospitalized in October 2020 with ARDS 2/2 COVID19 infection. Pt sustained a complex and lengthy hospitalization which included tracheostomy, prolonged sedation, ECMO.  In this period pt sustained CVA and SDH. Pt has now been liberated from tracheostomy and G-tube. Pt has since been hospitalized for wound infection and UTI. Pt lives with parents at home, has hospital bed and left chair, hoyer lift transfers, and power WC for mobility needs. Pt needs heavy physical assistance with ADL 2/2 BUE contractures and motor dysfunction.    Limitations Lifting;Standing;Walking;Writing;Reading;Sitting;House hold activities    How long can you sit comfortably? Patient reports uable to sit up long < 4 hours due to buttock discomfort    How long can you stand comfortably? Patient is currently unable to stand    How long can you walk comfortably? Patient is currently unable to walk    Diagnostic tests No recent relavent diagnostic tests    Patient Stated Goals I want to be able to possibly walk again                Transfer from [facility] to bariatric mat table with head elevated with pillow with hoyer lift and 2X A for positioning of LE and with maintaining pt comfort.  -shoes on for transfer    Exercise/Activity Sets/Reps/Time/ Resistance Assistance Charge type Comments  Anterior lean with feet elevated 2=x5 reps with 5 second holds Min near end of sets  Neuro  -decreased head of table height to increase difficulty this session   Anterior lean  with lateral weight shifting 2x8 reps to ea side  laterally for target distance  Neuro -Feet positioned on 8 inch step on floor to improve weigh bearing through Les bilaterally for remainder of exercises below with shoes donned bilaterally, lowered table to lowest height to improve weightbearing head of table elevated to standard distance from previous sessions  -Good weight shifting, cues for breathing throughout to prevent valsava maneuver   Ant lean with punches bilaterally  2 x 10 on ea side  PT hands for targets Neuro Increased difficulty with R UE as UE tends to fall into IR due to UE and shoulder weakness.Good weight shifting  noted.   Ant lean with LE press downs into step  X 10 ea LE  Tactile cues  Neuro  -min contraction felt in LE bilaterally  To assist with increased weightbearing ability to LE and to strengthen and improve HS muscle activation   Hoyer loft from seated on treatment table to WC x1 3x A for positioning  Neuro  - 2 attempts to position pt comfortably in chair          Seated marching  2x10 ea LE   Neuro  Min/ Mod a for R LE muscle activation and performance of movement                             Treatment Provided this session   Pt required occasional rest breaks due fatigue, however, rest breakes were shorted in duration and less frequent this session. PT was quick to ask when pt appeared to be fatiguing in order to prevent excessive fatigue.  Pt educated throughout session about proper posture and technique with exercises. Improved exercise technique, movement at target joints, use of target muscles after min to mod verbal, visual, tactile cues.  Note: Portions of this document were prepared using Dragon voice recognition software and although reviewed may contain unintentional dictation errors in syntax, grammar, or spelling.                             PT Education - 04/06/21 1547     Education Details Exercise terchnique    Person(s) Educated Patient    Methods Explanation;Demonstration    Comprehension Verbalized understanding;Returned demonstration              PT Short Term Goals - 03/15/21 1724       PT SHORT TERM GOAL #1   Title Pt will be independent with HEP in order to improve strength and balance in order to decrease fall risk and improve function at home and work.    Baseline 01/04/2021= No formal HEP in place; 12/12 no HEP in place    Time 6    Period Weeks    Status On-going    Target Date 02/15/21               PT Long Term Goals - 03/15/21 1725       PT LONG TERM GOAL #1   Title Patient will increase BLE gross strength  by 1/2 muscle grade to improve functional strength for improved independence with potential gait, increased standing tolerance and increased ADL ability.    Baseline 01/04/2021- Patient presents with 1/5 to 3-/5 B LE strength with MMT; 12/12: goal partially met for Left knee/hip    Time 12    Period Weeks    Status On-going  Target Date 03/29/21      PT LONG TERM GOAL #2   Title Patient will tolerate sitting unsupported demonstrating erect sitting posture for 15 minutes with CGA to demonstrate improved back extensor strength and improved sitting tolerance.    Baseline 01/04/2021- Patient confied to sitting in lift chair or electric power chair with back support and unable to sit upright without physical assistance; 12/12: tolerates <1 minutes upright unsupported sitting.    Time 12    Period Weeks    Status On-going    Target Date 03/29/21      PT LONG TERM GOAL #3   Title Patient will demonstrate ability to perform static standing in // bars > 2 min with Max Assist  without loss of balance and fair posture for improved overall strength for pre-gait and transfer activities.    Baseline 01/04/2021= Patient current uanble to stand- Dependent on hoyer or sit to stand lift for transfers.    Time 12    Period Weeks    Status On-going    Target Date 03/29/21      PT LONG TERM GOAL #4   Title Pt will improve FOTO score by 10 points or more demonstrating improved perceived functional ability    Baseline FOTO 7 on 10/17; 03/15/21: FOTO 12    Time 12    Period Weeks    Status On-going    Target Date 03/29/21                   Plan - 04/06/21 1548     Clinical Impression Statement Pt presents with excellent motivation for completion of PT program. Pt was able to demosntrate good core muscle activation in ability to sit up on mtreatment table this session and also demonstrated improved endurance with sitting unsupported. This continues to improve his ability to assist with positional  changes throughout the day. Will continue to work on utilizing sabina sit to stand lift in future sessions as pt tolerates. Patient has made good progress with therapy at this time and showed improved ability to sit unsupported as well as improved postural control. Pt will continue to benefit from skilled PT intervention to improve his overall function, weight shifting and postiion change ability, LE strength, trunk strength and improve his overall QOL.    Personal Factors and Comorbidities Comorbidity 3+;Time since onset of injury/illness/exacerbation    Comorbidities CVA, diabetes, Seizures    Examination-Activity Limitations Bathing;Bed Mobility;Bend;Caring for Others;Carry;Dressing;Hygiene/Grooming;Lift;Locomotion Level;Reach Overhead;Self Feeding;Sit;Squat;Stairs;Stand;Transfers;Toileting    Examination-Participation Restrictions Cleaning;Community Activity;Driving;Laundry;Medication Management;Meal Prep;Occupation;Personal Finances;Shop;Yard Work;Volunteer    Stability/Clinical Decision Making Evolving/Moderate complexity    Rehab Potential Fair    PT Frequency 2x / week    PT Duration 12 weeks    PT Treatment/Interventions ADLs/Self Care Home Management;Cryotherapy;Electrical Stimulation;Moist Heat;Ultrasound;DME Instruction;Gait training;Stair training;Functional mobility training;Therapeutic exercise;Balance training;Patient/family education;Orthotic Fit/Training;Neuromuscular re-education;Wheelchair mobility training;Manual techniques;Passive range of motion;Dry needling;Energy conservation;Taping;Visual/perceptual remediation/compensation;Joint Manipulations    PT Next Visit Plan core strength, postural control    PT Home Exercise Plan Needs formal execution to meet goal    Consulted and Agree with Plan of Care Patient;Family member/caregiver    Family Member Consulted Dad             Patient will benefit from skilled therapeutic intervention in order to improve the following deficits  and impairments:  Abnormal gait, Decreased activity tolerance, Decreased balance, Decreased cognition, Cardiopulmonary status limiting activity, Decreased coordination, Decreased endurance, Decreased knowledge of use of DME, Decreased mobility, Decreased range of motion, Decreased safety  awareness, Decreased strength, Difficulty walking, Hypomobility, Impaired perceived functional ability, Impaired flexibility, Impaired UE functional use, Impaired vision/preception, Improper body mechanics, Postural dysfunction, Obesity  Visit Diagnosis: Muscle weakness (generalized)  Abnormality of gait and mobility  Difficulty in walking, not elsewhere classified  Unsteadiness on feet     Problem List Patient Active Problem List   Diagnosis Date Noted   Sepsis due to vancomycin resistant Enterococcus species ([place]) 06/06/2019   SIRS (systemic inflammatory response syndrome) ([place]) 06/05/2019   Acute lower UTI 06/05/2019   VRE (vancomycin-resistant Enterococci) infection 06/05/2019   Anemia 06/05/2019   Skin ulcer of sacrum with necrosis of muscle ([place])    Urinary retention    Type 2 diabetes mellitus without complication, with long-term current use of insulin ([facility])    Tachycardia    Lower extremity edema    Acute metabolic encephalopathy    Obstructive sleep apnea    Morbid obesity with BMI of 60.0-69.9, adult ([place])    Goals of care, counseling/discussion    Palliative care encounter    Sepsis ([place]) 04/27/2019   H/O insulin dependent diabetes mellitus 04/27/2019   History of CVA with residual deficit 04/27/2019   Seizure disorder ([place]) 04/27/2019   Decubitus ulcer of sacral region, stage 4 ([place]) 04/27/2019    Particia Lather, PT 04/06/2021, 3:54 PM  [place] MAIN [facility] SERVICES [address] [place], [place], [address] Phone: [phone]   Fax:  [phone]  Name: Kiyan Burmester MRN: [id number] Date of Birth: 06/22/1995

## 2021-04-06 NOTE — Therapy (Signed)
[place] [facility] MAIN [facility] SERVICES [address] [place], [place], [address] Phone: [phone]   Fax:  [phone]  Occupational Therapy Treatment  Patient Details  Name: Paul Hall MRN: [id number] Date of Birth: [birth year] Referring Provider (OT): Lenise Herald   Encounter Date: 04/06/2021   OT End of Session - 04/06/21 1503     Visit Number 23    Number of Visits 48    Date for OT Re-Evaluation 06/15/21    Authorization Type Progress report period starting 12/15/2020    OT Start Time 1403    OT Stop Time 1450    OT Time Calculation (min) 47 min    Activity Tolerance Patient tolerated treatment well    Behavior During Therapy [facility] for tasks assessed/performed             Past Medical History:  Diagnosis Date   Diabetes mellitus (HCC)    Hypertension    Stroke [facility])     Past Surgical History:  Procedure Laterality Date   IVC FILTER PLACEMENT ([facility] HX)     LEG SURGERY     PEG TUBE PLACEMENT     TRACHEOSTOMY      There were no vitals filed for this visit.   Subjective Assessment - 04/06/21 1502     Subjective  Pt. reports doing well today, and has been enjoying his new AirPods.    Patient is accompanied by: Family member    Pertinent History Pt. is a 26 y.o. male who was hospitalized, and diagnosed with COVID-19, sustained a CVA with resultant quadreplegia in 01/2019. Pt. was hospitalized with VRE  UTI. Pt. PMHx includes: urinary retention, Seizure Disorder, Obstructive Sleep Disorder, DM Type II, and Morbid obesity. Pt. resides at home with his parents.    Currently in Pain? No/denies            OT TREATMENT     Estim attended:   Pt. tolerated neuromuscular estim  for 8 min., cycle time 10/10,  duty cycle 50%. Applied to the right wrist, and digit extensors, followed by  8 min. Applied to the left wrist, and digit extensors with the intensity set to 36, and constant monitoring 100% of the time. Manual assist  was required for wrist and digit extension with each rep. To hold the position.    Therapeutic Exercise:   Pt. Performed UE strengthening with 2# wrist weight on the left  for shoulder  flexion, abduction, and horizontal abduction, and 1# for the Right for shoulder abduction , and functional reaching in multiple planes. Emphasis was placed on bilateral supination stretches secondary to increased tightness. Patient tolerated moist heat modality to his bilateral hands prior to range of motion.   Pt. reports having had a nice holiday, and birthday. Pt. tolerated ROM, and Estim. Pt. continues to tolerate Estim well to the right, and left wrist, and digit extensors with manual assist. Pt. tolerated UE strengthening with wrist weights. Patient continues to work on normalizing tone in the bilateral upper extremities in order to improve range of motion, and facilitate active range of motion in order to increase engagement in ADLs and IADL tasks.                          OT Education - 04/06/21 1503     Education Details grasp/release functioning bilat hands    Person(s) Educated Patient    Methods Verbal cues;Tactile cues    Comprehension Verbalized  understanding;Verbal cues required;Need further instruction                 OT Long Term Goals - 03/22/21 1437       OT LONG TERM GOAL #1   Title Pt. will improve FOTO score by 5 points for clinically significant improvements during ADLs, and IADLs,    Baseline 21st: FOTO 43. 10th Visit: FOTO score 42 Eval: FOTO score: 41    Time 12    Period Weeks    Status Revised    Target Date 06/14/21      OT LONG TERM GOAL #2   Title Pt. will improve right shoulder flexion by 10 degrees to assist caregivers with UE dressing    Baseline Pt. continues to require assist with UE dressing secondary to limited ROM.    Time 12    Period Weeks    Status Achieved    Target Date 03/24/21      OT LONG TERM GOAL #3   Title Pt. will  improve right Abduction by 10 degrees to assist caregivers with underarm hygiene care.    Baseline Eval: AROM abduction RUE 76. Pt. continues to present with limited bilateral abduction. 21st: AROM abduction RUE 80.    Time 12    Period Weeks    Status On-going    Target Date 06/14/21      OT LONG TERM GOAL #4   Title Pt. will demonstrate visual compensatory strategies for navigating through his environment.    Baseline 20t: pt continues to require assist to navigate environment, minimal knowledge of strategies. Eval: Limited    Time 12    Period Weeks    Status On-going    Target Date 06/14/21      OT LONG TERM GOAL #5   Title Pt. will demonstrate visual compensatory strategies for tabletop tasks within his near space.    Baseline Pt. continues to present with limited vision. 21st: Pt can identify objects on tabletop ~waterbottle sized, improves with contrasting colours, cannot ID small onjects    Time 12    Period Weeks    Status On-going    Target Date 06/14/21      Long Term Additional Goals   Additional Long Term Goals Yes      OT LONG TERM GOAL #6   Title Pt. will perform hand to mouth patterns with min A in preparation for self-feeding.    Baseline Pt. is able to perfrom hand to mouth patterns, however reports weakness in his hand  limits a secure grasp on the utensi Pt. requires assist to perform. Eval: Pt. has difficulty    Time 12    Period Weeks    Status Achieved    Target Date 03/24/21      OT LONG TERM GOAL #7   Title Pt. will perform one step self-grooming tasks with minA.    Baseline Pt. has difficulty sustaining the UEs in elevation for the duration required to perfrom self-grooming tasks thoroughly.Eval: pt. has difficulty    Time 12    Status Achieved    Target Date 03/24/21      OT LONG TERM GOAL #8   Title Pt will complete UB dressing tasks with SETUP and assist to thread over hands only    Baseline 21st: Pt requires MIN A    Time 12    Period Weeks     Status New    Target Date 06/14/21      OT LONG TERM  GOAL  #9   TITLE Pt will improve B grip strength by 5# to assist in handling ADL items    Baseline 21st: R grip 3#, L grip 2#    Time 12    Period Weeks    Status New    Target Date 06/14/21      OT LONG TERM GOAL  #10   TITLE Pt will improve B lateral pinch strength by 3# to assist in UBD.    Baseline 21st: R lateral pinch 4#, L lateral pinch 3#    Time 12    Period Weeks    Status New    Target Date 06/14/21                   Plan - 04/06/21 1507     Clinical Impression Statement Pt. reports having had a nice holiday, and birthday. Pt. tolerated ROM, and Estim. Pt. continues to tolerate Estim well to the right, and left wrist, and digit extensors with manual assist. Pt. tolerated UE strengthening with wrist weights. Patient continues to work on normalizing tone in the bilateral upper extremities in order to improve range of motion, and facilitate active range of motion in order to increase engagement in ADLs and IADL tasks.   OT Occupational Profile and History Detailed Assessment- Review of Records and additional review of physical, cognitive, psychosocial history related to current functional performance    Occupational performance deficits (Please refer to evaluation for details): ADL's;IADL's    Body Structure / Function / Physical Skills ADL;FMC;ROM;Dexterity;IADL;Endurance;Obesity;Strength;UE functional use    Rehab Potential Good    Clinical Decision Making Multiple treatment options, significant modification of task necessary    Comorbidities Affecting Occupational Performance: Presence of comorbidities impacting occupational performance    Modification or Assistance to Complete Evaluation  Max significant modification of tasks or assist is necessary to complete    OT Frequency 2x / week    OT Duration 12 weeks    OT Treatment/Interventions Self-care/ADL training;Therapeutic exercise;Neuromuscular  education;Visual/perceptual remediation/compensation;Patient/family education;Therapeutic activities;DME and/or AE instruction;Passive range of motion;Moist Heat;Electrical Stimulation    Consulted and Agree with Plan of Care Patient             Patient will benefit from skilled therapeutic intervention in order to improve the following deficits and impairments:   Body Structure / Function / Physical Skills: ADL, FMC, ROM, Dexterity, IADL, Endurance, Obesity, Strength, UE functional use       Visit Diagnosis: Muscle weakness (generalized)  Other lack of coordination    Problem List Patient Active Problem List   Diagnosis Date Noted   Sepsis due to vancomycin resistant Enterococcus species (HCC) 06/06/2019   SIRS (systemic inflammatory response syndrome) (HCC) 06/05/2019   Acute lower UTI 06/05/2019   VRE (vancomycin-resistant Enterococci) infection 06/05/2019   Anemia 06/05/2019   Skin ulcer of sacrum with necrosis of muscle (HCC)    Urinary retention    Type 2 diabetes mellitus without complication, with long-term current use of insulin (HCC)    Tachycardia    Lower extremity edema    Acute metabolic encephalopathy    Obstructive sleep apnea    Morbid obesity with BMI of 60.0-69.9, adult (HCC)    Goals of care, counseling/discussion    Palliative care encounter    Sepsis (HCC) 04/27/2019   H/O insulin dependent diabetes mellitus 04/27/2019   History of CVA with residual deficit 04/27/2019   Seizure disorder (HCC) 04/27/2019   Decubitus ulcer of sacral region, stage 4 (HCC)  04/27/2019    Olegario MessierElaine Alexandre Lightsey, MS, OTR/L 04/06/2021, 3:13 PM   [facility] [address] [place], [place], [address] Phone: [phone]   Fax:  [phone]  Name: Paul Hall MRN: [id number] Date of Birth: 03-Aug-1995

## 2021-04-08 ENCOUNTER — Encounter: Payer: BC Managed Care – PPO | Admitting: Occupational Therapy

## 2021-04-08 ENCOUNTER — Ambulatory Visit: Payer: BC Managed Care – PPO

## 2021-04-08 ENCOUNTER — Encounter: Payer: BC Managed Care – PPO | Admitting: Speech Pathology

## 2021-04-09 DIAGNOSIS — E119 Type 2 diabetes mellitus without complications: Secondary | ICD-10-CM | POA: Diagnosis not present

## 2021-04-12 ENCOUNTER — Encounter: Payer: BC Managed Care – PPO | Admitting: Speech Pathology

## 2021-04-12 ENCOUNTER — Other Ambulatory Visit: Payer: Self-pay

## 2021-04-12 ENCOUNTER — Ambulatory Visit: Payer: BC Managed Care – PPO | Admitting: Physical Therapy

## 2021-04-12 ENCOUNTER — Ambulatory Visit: Payer: BC Managed Care – PPO

## 2021-04-12 ENCOUNTER — Encounter: Payer: BC Managed Care – PPO | Admitting: Occupational Therapy

## 2021-04-12 ENCOUNTER — Encounter: Payer: Self-pay | Admitting: Occupational Therapy

## 2021-04-12 ENCOUNTER — Ambulatory Visit: Payer: BC Managed Care – PPO | Admitting: Occupational Therapy

## 2021-04-12 DIAGNOSIS — I6930 Unspecified sequelae of cerebral infarction: Secondary | ICD-10-CM | POA: Diagnosis not present

## 2021-04-12 DIAGNOSIS — R262 Difficulty in walking, not elsewhere classified: Secondary | ICD-10-CM

## 2021-04-12 DIAGNOSIS — M6281 Muscle weakness (generalized): Secondary | ICD-10-CM | POA: Diagnosis not present

## 2021-04-12 DIAGNOSIS — R278 Other lack of coordination: Secondary | ICD-10-CM | POA: Diagnosis not present

## 2021-04-12 DIAGNOSIS — R269 Unspecified abnormalities of gait and mobility: Secondary | ICD-10-CM | POA: Diagnosis not present

## 2021-04-12 DIAGNOSIS — R2689 Other abnormalities of gait and mobility: Secondary | ICD-10-CM | POA: Diagnosis not present

## 2021-04-12 DIAGNOSIS — R2681 Unsteadiness on feet: Secondary | ICD-10-CM | POA: Diagnosis not present

## 2021-04-12 NOTE — Therapy (Signed)
[place] [facility] MAIN [facility] SERVICES [address] [place], [place], [address] Phone: [phone]   Fax:  [phone]  Occupational Therapy Treatment  Patient Details  Name: Paul Hall MRN: [id number] Date of Birth: [birth year] Referring Provider (OT): Lenise Herald   Encounter Date: 04/12/2021   OT End of Session - 04/12/21 1713     Visit Number 24    Number of Visits 48    Date for OT Re-Evaluation 06/15/21    Authorization Type Progress report period starting 03/17/2021    OT Start Time 1310    OT Stop Time 1345    OT Time Calculation (min) 35 min    Activity Tolerance Patient tolerated treatment well    Behavior During Therapy [facility] for tasks assessed/performed             Past Medical History:  Diagnosis Date   Diabetes mellitus (HCC)    Hypertension    Stroke [facility])     Past Surgical History:  Procedure Laterality Date   IVC FILTER PLACEMENT ([facility] HX)     LEG SURGERY     PEG TUBE PLACEMENT     TRACHEOSTOMY      There were no vitals filed for this visit.   Subjective Assessment - 04/12/21 1711     Subjective  Pt. reports having had a good weekend.    Patient is accompanied by: Family member    Pertinent History Pt. is a 26 y.o. male who was hospitalized, and diagnosed with COVID-19, sustained a CVA with resultant quadreplegia in 01/2019. Pt. was hospitalized with VRE  UTI. Pt. PMHx includes: urinary retention, Seizure Disorder, Obstructive Sleep Disorder, DM Type II, and Morbid obesity. Pt. resides at home with his parents.    Currently in Pain? Yes    Pain Score --   Not rated   Pain Location Finger (Comment which one)    Pain Orientation Right;Left    Pain Descriptors / Indicators Tender    Pain Type Chronic pain    Pain Onset More than a month ago           OT TREATMENT     Estim attended:   Pt. tolerated neuromuscular estim  for 8 min., cycle time 10/10,  duty cycle 50%. Applied to the right wrist,  and digit extensors, followed by  8 min. Applied to the left wrist, and digit extensors with the intensity set to 36, and constant monitoring 100% of the time. Manual assist was required for wrist and digit extension with each rep. To hold the position.    Therapeutic Exercise:   Pt. Performed UE strengthening with 2# wrist weight on the left  for shoulder  flexion, abduction, and horizontal abduction, and 1# for the Right for shoulder abduction , and functional reaching in multiple planes. Emphasis was placed on bilateral supination stretches secondary to increased tightness. Patient tolerated moist heat modality to his bilateral hands prior to range of motion.   Pt. was late for the session coming from the cafeteria for lunch during the break between sessions. Pt. tolerated ROM well to the bilateral UEs. Pt. presented with tenderness with digit MP, PIP, and DIP estension ROM, and pt. Maintains digit MPs, PIPs, and DIPs in flexion. Pt. continues to tolerate Estim well to the right, and left wrist, and digit extensors with manual assist. Pt. tolerated UE strengthening with wrist weights. Patient continues to work on normalizing tone in the bilateral upper extremities in order to improve range  of motion, and facilitate active range of motion in order to increase engagement in ADLs and IADL tasks.                             OT Education - 04/12/21 1712     Education Details ROM    Person(s) Educated Patient    Methods Verbal cues;Tactile cues    Comprehension Verbalized understanding;Verbal cues required;Need further instruction                 OT Long Term Goals - 03/22/21 1437       OT LONG TERM GOAL #1   Title Pt. will improve FOTO score by 5 points for clinically significant improvements during ADLs, and IADLs,    Baseline 21st: FOTO 43. 10th Visit: FOTO score 42 Eval: FOTO score: 41    Time 12    Period Weeks    Status Revised    Target Date 06/14/21       OT LONG TERM GOAL #2   Title Pt. will improve right shoulder flexion by 10 degrees to assist caregivers with UE dressing    Baseline Pt. continues to require assist with UE dressing secondary to limited ROM.    Time 12    Period Weeks    Status Achieved    Target Date 03/24/21      OT LONG TERM GOAL #3   Title Pt. will improve right Abduction by 10 degrees to assist caregivers with underarm hygiene care.    Baseline Eval: AROM abduction RUE 76. Pt. continues to present with limited bilateral abduction. 21st: AROM abduction RUE 80.    Time 12    Period Weeks    Status On-going    Target Date 06/14/21      OT LONG TERM GOAL #4   Title Pt. will demonstrate visual compensatory strategies for navigating through his environment.    Baseline 20t: pt continues to require assist to navigate environment, minimal knowledge of strategies. Eval: Limited    Time 12    Period Weeks    Status On-going    Target Date 06/14/21      OT LONG TERM GOAL #5   Title Pt. will demonstrate visual compensatory strategies for tabletop tasks within his near space.    Baseline Pt. continues to present with limited vision. 21st: Pt can identify objects on tabletop ~waterbottle sized, improves with contrasting colours, cannot ID small onjects    Time 12    Period Weeks    Status On-going    Target Date 06/14/21      Long Term Additional Goals   Additional Long Term Goals Yes      OT LONG TERM GOAL #6   Title Pt. will perform hand to mouth patterns with min A in preparation for self-feeding.    Baseline Pt. is able to perfrom hand to mouth patterns, however reports weakness in his hand  limits a secure grasp on the utensi Pt. requires assist to perform. Eval: Pt. has difficulty    Time 12    Period Weeks    Status Achieved    Target Date 03/24/21      OT LONG TERM GOAL #7   Title Pt. will perform one step self-grooming tasks with minA.    Baseline Pt. has difficulty sustaining the UEs in elevation for the  duration required to perfrom self-grooming tasks thoroughly.Eval: pt. has difficulty    Time 12  Status Achieved    Target Date 03/24/21      OT LONG TERM GOAL #8   Title Pt will complete UB dressing tasks with SETUP and assist to thread over hands only    Baseline 21st: Pt requires MIN A    Time 12    Period Weeks    Status New    Target Date 06/14/21      OT LONG TERM GOAL  #9   TITLE Pt will improve B grip strength by 5# to assist in handling ADL items    Baseline 21st: R grip 3#, L grip 2#    Time 12    Period Weeks    Status New    Target Date 06/14/21      OT LONG TERM GOAL  #10   TITLE Pt will improve B lateral pinch strength by 3# to assist in UBD.    Baseline 21st: R lateral pinch 4#, L lateral pinch 3#    Time 12    Period Weeks    Status New    Target Date 06/14/21                   Plan - 04/12/21 1714     Clinical Impression Statement Pt. was late for the session coming from the cafeteria for lunch during the break between sessions. Pt. tolerated ROM well to the bilateral UEs. Pt. presented with tenderness with digit MP, PIP, and DIP estension ROM, and pt. Maintains digit MPs, PIPs, and DIPs in flexion. Pt. continues to tolerate Estim well to the right, and left wrist, and digit extensors with manual assist. Pt. tolerated UE strengthening with wrist weights. Patient continues to work on normalizing tone in the bilateral upper extremities in order to improve range of motion, and facilitate active range of motion in order to increase engagement in ADLs and IADL tasks.         OT Occupational Profile and History Detailed Assessment- Review of Records and additional review of physical, cognitive, psychosocial history related to current functional performance    Occupational performance deficits (Please refer to evaluation for details): ADL's;IADL's    Body Structure / Function / Physical Skills ADL;FMC;ROM;Dexterity;IADL;Endurance;Obesity;Strength;UE  functional use    Rehab Potential Good    Clinical Decision Making Multiple treatment options, significant modification of task necessary    Comorbidities Affecting Occupational Performance: Presence of comorbidities impacting occupational performance    Modification or Assistance to Complete Evaluation  Max significant modification of tasks or assist is necessary to complete    OT Frequency 2x / week    OT Duration 12 weeks    OT Treatment/Interventions Self-care/ADL training;Therapeutic exercise;Neuromuscular education;Visual/perceptual remediation/compensation;Patient/family education;Therapeutic activities;DME and/or AE instruction;Passive range of motion;Moist Heat;Electrical Stimulation    Consulted and Agree with Plan of Care Patient             Patient will benefit from skilled therapeutic intervention in order to improve the following deficits and impairments:   Body Structure / Function / Physical Skills: ADL, FMC, ROM, Dexterity, IADL, Endurance, Obesity, Strength, UE functional use       Visit Diagnosis: Muscle weakness (generalized)    Problem List Patient Active Problem List   Diagnosis Date Noted   Sepsis due to vancomycin resistant Enterococcus species (HCC) 06/06/2019   SIRS (systemic inflammatory response syndrome) (HCC) 06/05/2019   Acute lower UTI 06/05/2019   VRE (vancomycin-resistant Enterococci) infection 06/05/2019   Anemia 06/05/2019   Skin ulcer of sacrum with necrosis of  muscle (HCC)    Urinary retention    Type 2 diabetes mellitus without complication, with long-term current use of insulin (HCC)    Tachycardia    Lower extremity edema    Acute metabolic encephalopathy    Obstructive sleep apnea    Morbid obesity with BMI of 60.0-69.9, adult (HCC)    Goals of care, counseling/discussion    Palliative care encounter    Sepsis (HCC) 04/27/2019   H/O insulin dependent diabetes mellitus 04/27/2019   History of CVA with residual deficit 04/27/2019    Seizure disorder (HCC) 04/27/2019   Decubitus ulcer of sacral region, stage 4 (HCC) 04/27/2019    Olegario MessierElaine Aymar Whitfill, MS, OTR/L 04/12/2021, 5:16 PM  [place] [facility] [address] [place], [place], [address] Phone: [phone]   Fax:  [phone]  Name: Paul Hall MRN: [id number] Date of Birth: [birth year]

## 2021-04-12 NOTE — Therapy (Signed)
[place] MAIN [facility] SERVICES [address] [place], [place], [address] Phone: [phone]   Fax:  [phone]  Physical Therapy Treatment  Patient Details  Name: Paul Hall MRN: [id number] Date of Birth: 05-13-1995 Referring Provider (PT): Collene Mares, [place]   Encounter Date: 04/12/2021   PT End of Session - 04/12/21 1207     Visit Number 14    Number of Visits 25    Date for PT Re-Evaluation 03/29/21    Authorization Type [employer] and [employer]- Need auth past 12th visit    Authorization Time Period 01/04/21-03/29/21    Authorization - Visit Number 11    Authorization - Number of Visits 12    Progress Note Due on Visit 20    PT Start Time 1200    PT Stop Time 1230    PT Time Calculation (min) 30 min    Equipment Utilized During Treatment Other (comment)   Hoyer lift   Activity Tolerance Patient tolerated treatment well;Treatment limited secondary to medical complications (Comment)   queasiness/nausea   Behavior During Therapy WFL for tasks assessed/performed             Past Medical History:  Diagnosis Date   Diabetes mellitus ([place])    Hypertension    Stroke [facility])     Past Surgical History:  Procedure Laterality Date   IVC FILTER PLACEMENT ([facility] HX)     LEG SURGERY     PEG TUBE PLACEMENT     TRACHEOSTOMY      There were no vitals filed for this visit.  Stayed in power wheelchair due to time restraints in todays appointment   Exercise/Activity Sets/Reps/Time/ Resistance Assistance Charge type Comments  Anterior lean with feet elevated 2=x5 reps with 5 second holds Min near end of sets  Neuro  -decreased head of table height to increase difficulty this session   Anterior lean with lateral weight shifting 2x8 reps to ea side  laterally for target distance  Neuro -Feet positioned on 8 inch step on floor to improve weigh bearing through Les bilaterally for remainder of exercises below with shoes donned  bilaterally, lowered table to lowest height to improve weightbearing head of table elevated to standard distance from previous sessions  -Good weight shifting, cues for breathing throughout to prevent valsava maneuver   Ant lean with punches bilaterally  2 x 10 on ea side  PT hands for targets Neuro Increased difficulty with R UE as UE tends to fall into IR due to UE and shoulder weakness.Good weight shifting noted.   Ant lean with LE press downs into step  X 10 ea LE  Tactile cues  Neuro  -min contraction felt in LE bilaterally  To assist with increased weightbearing ability to LE and to strengthen and improve HS muscle activation                Seated marching  2x10 ea LE   Neuro  Min/ Mod a for R LE muscle activation and performance of movement                             Treatment Provided this session   Pt required occasional rest breaks due fatigue, however, rest breakes were shorted in duration and less frequent this session. PT was quick to ask when pt appeared to be fatiguing in order to prevent excessive fatigue.  Pt educated throughout session about proper posture and  technique with exercises. Improved exercise technique, movement at target joints, use of target muscles after min to mod verbal, visual, tactile cues.  Note: Portions of this document were prepared using Dragon voice recognition software and although reviewed may contain unintentional dictation errors in syntax, grammar, or spelling.   Pt session was cut a little short today due to pt arriving a few minutes late to scheduled appointment time                                PT Short Term Goals - 03/15/21 1724       PT SHORT TERM GOAL #1   Title Pt will be independent with HEP in order to improve strength and balance in order to decrease fall risk and improve function at home and work.    Baseline 01/04/2021= No formal HEP in place; 12/12 no HEP in place    Time 6    Period Weeks     Status On-going    Target Date 02/15/21               PT Long Term Goals - 03/15/21 1725       PT LONG TERM GOAL #1   Title Patient will increase BLE gross strength by 1/2 muscle grade to improve functional strength for improved independence with potential gait, increased standing tolerance and increased ADL ability.    Baseline 01/04/2021- Patient presents with 1/5 to 3-/5 B LE strength with MMT; 12/12: goal partially met for Left knee/hip    Time 12    Period Weeks    Status On-going    Target Date 03/29/21      PT LONG TERM GOAL #2   Title Patient will tolerate sitting unsupported demonstrating erect sitting posture for 15 minutes with CGA to demonstrate improved back extensor strength and improved sitting tolerance.    Baseline 01/04/2021- Patient confied to sitting in lift chair or electric power chair with back support and unable to sit upright without physical assistance; 12/12: tolerates <1 minutes upright unsupported sitting.    Time 12    Period Weeks    Status On-going    Target Date 03/29/21      PT LONG TERM GOAL #3   Title Patient will demonstrate ability to perform static standing in // bars > 2 min with Max Assist  without loss of balance and fair posture for improved overall strength for pre-gait and transfer activities.    Baseline 01/04/2021= Patient current uanble to stand- Dependent on hoyer or sit to stand lift for transfers.    Time 12    Period Weeks    Status On-going    Target Date 03/29/21      PT LONG TERM GOAL #4   Title Pt will improve FOTO score by 10 points or more demonstrating improved perceived functional ability    Baseline FOTO 7 on 10/17; 03/15/21: FOTO 12    Time 12    Period Weeks    Status On-going    Target Date 03/29/21                   Plan - 04/12/21 1214     Clinical Impression Statement Pt presents with excellent motivation for completion of PT program. Pt was able to demosntrate good core muscle activation in  ability to sit up in his WC from reclined position this session and also demonstrated good endurance with sitting  unsupported. This continues to improve his ability to assist with positional changes throughout the day. Will continue to work on utilizing sabina sit to stand lift in future sessions as pt tolerates. Patient has made good progress with therapy at this time and showed improved ability to sit unsupported as well as improved postural control. Pt does require rest breaks and cues for breathing pattern thrughout sessions.  Pt will continue to benefit from skilled PT intervention to improve his overall function, weight shifting and postiion change ability, LE strength, trunk strength and improve his overall QOL.    Personal Factors and Comorbidities Comorbidity 3+;Time since onset of injury/illness/exacerbation    Comorbidities CVA, diabetes, Seizures    Examination-Activity Limitations Bathing;Bed Mobility;Bend;Caring for Others;Carry;Dressing;Hygiene/Grooming;Lift;Locomotion Level;Reach Overhead;Self Feeding;Sit;Squat;Stairs;Stand;Transfers;Toileting    Examination-Participation Restrictions Cleaning;Community Activity;Driving;Laundry;Medication Management;Meal Prep;Occupation;Personal Finances;Shop;Yard Work;Volunteer    Stability/Clinical Decision Making Evolving/Moderate complexity    Rehab Potential Fair    PT Frequency 2x / week    PT Duration 12 weeks    PT Treatment/Interventions ADLs/Self Care Home Management;Cryotherapy;Electrical Stimulation;Moist Heat;Ultrasound;DME Instruction;Gait training;Stair training;Functional mobility training;Therapeutic exercise;Balance training;Patient/family education;Orthotic Fit/Training;Neuromuscular re-education;Wheelchair mobility training;Manual techniques;Passive range of motion;Dry needling;Energy conservation;Taping;Visual/perceptual remediation/compensation;Joint Manipulations    PT Next Visit Plan core strength, postural control, sabina sit to  stand if able    PT Home Exercise Plan Needs formal execution to meet goal    Consulted and Agree with Plan of Care Patient;Family member/caregiver    Family Member Consulted Dad             Patient will benefit from skilled therapeutic intervention in order to improve the following deficits and impairments:  Abnormal gait, Decreased activity tolerance, Decreased balance, Decreased cognition, Cardiopulmonary status limiting activity, Decreased coordination, Decreased endurance, Decreased knowledge of use of DME, Decreased mobility, Decreased range of motion, Decreased safety awareness, Decreased strength, Difficulty walking, Hypomobility, Impaired perceived functional ability, Impaired flexibility, Impaired UE functional use, Impaired vision/preception, Improper body mechanics, Postural dysfunction, Obesity  Visit Diagnosis: Muscle weakness (generalized)  Difficulty in walking, not elsewhere classified  History of CVA with residual deficit  Other abnormalities of gait and mobility     Problem List Patient Active Problem List   Diagnosis Date Noted   Sepsis due to vancomycin resistant Enterococcus species ([place]) 06/06/2019   SIRS (systemic inflammatory response syndrome) ([place]) 06/05/2019   Acute lower UTI 06/05/2019   VRE (vancomycin-resistant Enterococci) infection 06/05/2019   Anemia 06/05/2019   Skin ulcer of sacrum with necrosis of muscle ([place])    Urinary retention    Type 2 diabetes mellitus without complication, with long-term current use of insulin (H[facility])    Tachycardia    Lower extremity edema    Acute metabolic encephalopathy    Obstructive sleep apnea    Morbid obesity with BMI of 60.0-69.9, adult ([place])    Goals of care, counseling/discussion    Palliative care encounter    Sepsis ([place]) 04/27/2019   H/O insulin dependent diabetes mellitus 04/27/2019   History of CVA with residual deficit 04/27/2019   Seizure disorder ([place]) 04/27/2019   Decubitus ulcer of sacral  region, stage 4 ([place]) 04/27/2019    Particia Lather, PT 04/12/2021, 12:51 PM  [place] MAIN [facility] SERVICES [address] [place], [place], [address] Phone: [phone]   Fax:  [phone]  Name: Paul Hall MRN: [id number] Date of Birth: Mar 29, 1996

## 2021-04-14 ENCOUNTER — Other Ambulatory Visit: Payer: Self-pay

## 2021-04-14 ENCOUNTER — Encounter: Payer: BC Managed Care – PPO | Admitting: Occupational Therapy

## 2021-04-14 ENCOUNTER — Ambulatory Visit: Payer: BC Managed Care – PPO

## 2021-04-14 ENCOUNTER — Encounter: Payer: BC Managed Care – PPO | Admitting: Speech Pathology

## 2021-04-14 DIAGNOSIS — R2689 Other abnormalities of gait and mobility: Secondary | ICD-10-CM | POA: Diagnosis not present

## 2021-04-14 DIAGNOSIS — I6930 Unspecified sequelae of cerebral infarction: Secondary | ICD-10-CM | POA: Diagnosis not present

## 2021-04-14 DIAGNOSIS — M6281 Muscle weakness (generalized): Secondary | ICD-10-CM

## 2021-04-14 DIAGNOSIS — R269 Unspecified abnormalities of gait and mobility: Secondary | ICD-10-CM

## 2021-04-14 DIAGNOSIS — R2681 Unsteadiness on feet: Secondary | ICD-10-CM | POA: Diagnosis not present

## 2021-04-14 DIAGNOSIS — R262 Difficulty in walking, not elsewhere classified: Secondary | ICD-10-CM

## 2021-04-14 DIAGNOSIS — R278 Other lack of coordination: Secondary | ICD-10-CM

## 2021-04-14 NOTE — Therapy (Signed)
[facility] [address] [place], [place], [address] Phone: [phone]   Fax:  [phone]  Occupational Therapy Treatment  Patient Details  Name: Paul Hall MRN: [id number] Date of Birth: 06/09/1995 Referring Provider (OT): Lenise HeraldBenjamin Mann   Encounter Date: 04/14/2021   OT End of Session - 04/14/21 1618     Visit Number 25    Number of Visits 48    Date for OT Re-Evaluation 06/15/21    Authorization Type Progress report period starting 03/17/2021    OT Start Time 1520    OT Stop Time 1610    OT Time Calculation (min) 50 min    Equipment Utilized During Treatment tilt in space power wc    Activity Tolerance Patient tolerated treatment well    Behavior During Therapy WFL for tasks assessed/performed             Past Medical History:  Diagnosis Date   Diabetes mellitus (H[facility])    Hypertension    Stroke [facility](HCC)     Past Surgical History:  Procedure Laterality Date   IVC FILTER PLACEMENT ([facility] HX)     LEG SURGERY     PEG TUBE PLACEMENT     TRACHEOSTOMY      There were no vitals filed for this visit.   Subjective Assessment - 04/14/21 1616     Subjective  Pt reports RUE feeling loosened up today.    Patient is accompanied by: Family member    Pertinent History Pt. is a 26 y.o. male who was hospitalized, and diagnosed with COVID-19, sustained a CVA with resultant quadreplegia in 01/2019. Pt. was hospitalized with VRE  UTI. Pt. PMHx includes: urinary retention, Seizure Disorder, Obstructive Sleep Disorder, DM Type II, and Morbid obesity. Pt. resides at home with his parents.    Limitations BUE, and LE limitations with ROM, strength, motor control, and  [facility] skills.    Patient Stated Goals To improve his arms, and hands.    Currently in Pain? No/denies    Pain Score 0-No pain    Pain Onset More than a month ago    Pain Onset More than a month ago            Occupational Therapy Treatment: Moist  heat applied to R/L hands x 5 min for pain reduction/muscle relaxation in prep for therapeutic exercise and stretching.   Therapeutic Exercise: Performed prolonged passive stretching for digit and wrist extension R/LUEs in prep for Estim and grasp/release patterns for self care.  Performed active assisted forward and lateral reaching 2 sets 10 reps R/LUEs.  Cues to extend elbows for greater reach.  Assisted to grasp 1 lb dowel to perform 2 sets 7 reps of shoulder flexion with min A at 75% full range, working to improve active shoulder flexion for overhead reach necessary for ADLs.    Estim: Pt. tolerated neuromuscular estim  for 8 min., cycle time 10/10,  duty cycle 50%. Applied to the right wrist and digit extensors 8 min, followed by 8 min applied to the left wrist, and digit extensors with the intensity set to 38, and constant monitoring 100% of the time.  Manual assist was required for wrist and digit extension with each rep.  Pt worked on grip squeezing during off cycle, and digit extension with ramp up to on cycle to promote active grasp/release patterns during ADLs.   Response to Treatment: See Plan/clinical impression below.    OT Education - 04/14/21 1617  Education Details BUE AROM    Person(s) Educated Patient    Methods Verbal cues;Tactile cues    Comprehension Verbalized understanding;Verbal cues required;Need further instruction;Returned demonstration                 OT Long Term Goals - 03/22/21 1437       OT LONG TERM GOAL #1   Title Pt. will improve FOTO score by 5 points for clinically significant improvements during ADLs, and IADLs,    Baseline 21st: FOTO 43. 10th Visit: FOTO score 42 Eval: FOTO score: 41    Time 12    Period Weeks    Status Revised    Target Date 06/14/21      OT LONG TERM GOAL #2   Title Pt. will improve right shoulder flexion by 10 degrees to assist caregivers with UE dressing    Baseline Pt. continues to require assist with UE  dressing secondary to limited ROM.    Time 12    Period Weeks    Status Achieved    Target Date 03/24/21      OT LONG TERM GOAL #3   Title Pt. will improve right Abduction by 10 degrees to assist caregivers with underarm hygiene care.    Baseline Eval: AROM abduction RUE 76. Pt. continues to present with limited bilateral abduction. 21st: AROM abduction RUE 80.    Time 12    Period Weeks    Status On-going    Target Date 06/14/21      OT LONG TERM GOAL #4   Title Pt. will demonstrate visual compensatory strategies for navigating through his environment.    Baseline 20t: pt continues to require assist to navigate environment, minimal knowledge of strategies. Eval: Limited    Time 12    Period Weeks    Status On-going    Target Date 06/14/21      OT LONG TERM GOAL #5   Title Pt. will demonstrate visual compensatory strategies for tabletop tasks within his near space.    Baseline Pt. continues to present with limited vision. 21st: Pt can identify objects on tabletop ~waterbottle sized, improves with contrasting colours, cannot ID small onjects    Time 12    Period Weeks    Status On-going    Target Date 06/14/21      Long Term Additional Goals   Additional Long Term Goals Yes      OT LONG TERM GOAL #6   Title Pt. will perform hand to mouth patterns with min A in preparation for self-feeding.    Baseline Pt. is able to perfrom hand to mouth patterns, however reports weakness in his hand  limits a secure grasp on the utensi Pt. requires assist to perform. Eval: Pt. has difficulty    Time 12    Period Weeks    Status Achieved    Target Date 03/24/21      OT LONG TERM GOAL #7   Title Pt. will perform one step self-grooming tasks with minA.    Baseline Pt. has difficulty sustaining the UEs in elevation for the duration required to perfrom self-grooming tasks thoroughly.Eval: pt. has difficulty    Time 12    Status Achieved    Target Date 03/24/21      OT LONG TERM GOAL #8    Title Pt will complete UB dressing tasks with SETUP and assist to thread over hands only    Baseline 21st: Pt requires MIN A    Time 12  Period Weeks    Status New    Target Date 06/14/21      OT LONG TERM GOAL  #9   TITLE Pt will improve B grip strength by 5# to assist in handling ADL items    Baseline 21st: R grip 3#, L grip 2#    Time 12    Period Weeks    Status New    Target Date 06/14/21      OT LONG TERM GOAL  #10   TITLE Pt will improve B lateral pinch strength by 3# to assist in UBD.    Baseline 21st: R lateral pinch 4#, L lateral pinch 3#    Time 12    Period Weeks    Status New    Target Date 06/14/21              Plan - 04/14/21 1632     Clinical Impression Statement Pt reporting RUE feeling loosened up today, and LUE demonstrated good forward flexion and abduction during reaching activities with min guard to maximize end range.  Flexor tone in BUEs continues to limit functional hand skills.  Pt. continues to tolerate Estim well to the right, and left wrist, and digit extensors with manual assist.  Patient continues to work on normalizing tone in the bilateral upper extremities in order to improve range of motion, and facilitate active range of motion in order to increase engagement in ADLs and IADL tasks.    OT Occupational Profile and History Detailed Assessment- Review of Records and additional review of physical, cognitive, psychosocial history related to current functional performance    Occupational performance deficits (Please refer to evaluation for details): ADL's;IADL's    Body Structure / Function / Physical Skills ADL;FMC;ROM;Dexterity;IADL;Endurance;Obesity;Strength;UE functional use    Rehab Potential Good    Clinical Decision Making Multiple treatment options, significant modification of task necessary    Comorbidities Affecting Occupational Performance: Presence of comorbidities impacting occupational performance    Modification or Assistance to  Complete Evaluation  Max significant modification of tasks or assist is necessary to complete    OT Frequency 2x / week    OT Duration 12 weeks    OT Treatment/Interventions Self-care/ADL training;Therapeutic exercise;Neuromuscular education;Visual/perceptual remediation/compensation;Patient/family education;Therapeutic activities;DME and/or AE instruction;Passive range of motion;Moist Heat;Electrical Stimulation    Consulted and Agree with Plan of Care Patient             Patient will benefit from skilled therapeutic intervention in order to improve the following deficits and impairments:   Body Structure / Function / Physical Skills: ADL, FMC, ROM, Dexterity, IADL, Endurance, Obesity, Strength, UE functional use       Visit Diagnosis: Muscle weakness (generalized)  Other lack of coordination    Problem List Patient Active Problem List   Diagnosis Date Noted   Sepsis due to vancomycin resistant Enterococcus species (HCC) 06/06/2019   SIRS (systemic inflammatory response syndrome) (HCC) 06/05/2019   Acute lower UTI 06/05/2019   VRE (vancomycin-resistant Enterococci) infection 06/05/2019   Anemia 06/05/2019   Skin ulcer of sacrum with necrosis of muscle (HCC)    Urinary retention    Type 2 diabetes mellitus without complication, with long-term current use of insulin (HCC)    Tachycardia    Lower extremity edema    Acute metabolic encephalopathy    Obstructive sleep apnea    Morbid obesity with BMI of 60.0-69.9, adult (HCC)    Goals of care, counseling/discussion    Palliative care encounter    Sepsis (HCC)  04/27/2019   H/O insulin dependent diabetes mellitus 04/27/2019   History of CVA with residual deficit 04/27/2019   Seizure disorder (HCC) 04/27/2019   Decubitus ulcer of sacral region, stage 4 (HCC) 04/27/2019   Danelle EarthlyNancy Allisson Schindel, MS, OTR/L  Otis DialsNancy K Daisy Lites, OT 04/14/2021, 4:33 PM  [place] [facility] [address] [place], [place], [address] Phone: [phone]   Fax:  [phone]  Name: Paul Hall MRN: [id number] Date of Birth: Sep 20, 1995

## 2021-04-15 NOTE — Therapy (Signed)
[place] MAIN [facility] SERVICES [address] [place], [place], [address] Phone: [phone]   Fax:  [phone]  Physical Therapy Treatment  Patient Details  Name: Paul Hall MRN: [id number] Date of Birth: 02-25-1996 Referring Provider (PT): Collene Mares, PA-C   Encounter Date: 04/14/2021   PT End of Session - 04/14/21 1712     Visit Number 15    Number of Visits 25    Date for PT Re-Evaluation 06/16/21    Authorization Type [employer] and [employer] Medicaid- Need auth past 12th visit    Authorization Time Period 01/04/21-03/29/21; Recert 33/35/4562-5/63/8937    Authorization - Visit Number --    Authorization - Number of Visits --    Progress Note Due on Visit 20    PT Start Time 3428    PT Stop Time 1514    PT Time Calculation (min) 36 min    Equipment Utilized During Treatment Other (comment)   Hoyer lift   Activity Tolerance Patient tolerated treatment well;Treatment limited secondary to medical complications (Comment)   queasiness/nausea   Behavior During Therapy WFL for tasks assessed/performed             Past Medical History:  Diagnosis Date   Diabetes mellitus ([place])    Hypertension    Stroke [facility])     Past Surgical History:  Procedure Laterality Date   IVC FILTER PLACEMENT ([facility] HX)     LEG SURGERY     PEG TUBE PLACEMENT     TRACHEOSTOMY      There were no vitals filed for this visit.   Subjective Assessment - 04/15/21 1710     Subjective Patient reports doing well today. He arrived at wrong time due to misunderstanding of schedule.    Patient is accompained by: Family member    Pertinent History [name] is a 74yoM who presents with severe weakness, quadriparesis, altered sensorium, and visual impairment s/p critical illness and prolonged hospitalization. Pt hospitalized in October 2020 with ARDS 2/2 COVID19 infection. Pt sustained a complex and lengthy hospitalization which included tracheostomy,  prolonged sedation, ECMO. In this period pt sustained CVA and SDH. Pt has now been liberated from tracheostomy and G-tube. Pt has since been hospitalized for wound infection and UTI. Pt lives with parents at home, has hospital bed and left chair, hoyer lift transfers, and power WC for mobility needs. Pt needs heavy physical assistance with ADL 2/2 BUE contractures and motor dysfunction.    Limitations Lifting;Standing;Walking;Writing;Reading;Sitting;House hold activities    How long can you sit comfortably? Patient reports uable to sit up long < 4 hours due to buttock discomfort    How long can you stand comfortably? Patient is currently unable to stand    How long can you walk comfortably? Patient is currently unable to walk    Diagnostic tests No recent relavent diagnostic tests    Patient Stated Goals I want to be able to possibly walk again    Currently in Pain? No/denies    Pain Onset More than a month ago    Pain Onset More than a month ago              INTERVENTIONS:   Patient arrived at wrong time so session modified to sitting/reclined exercises today.    Seated knee ext 1.5 AW 2 sets of 10 reps Seated assisted hip flex 2 sets of 10 reps Seated Hip add with ball squeeze x 5 sec x 10 reps Seated leg kick  for coordination (kicking leg as PT called out left or right) - Patient was responsive and only minimal delay on left LE x 2 min  Reclined gluteal sets - hold 5 sec 2 sets of 10 reps Reclined Leg press (manual resistance)  x 10 reps x 2 sets Reclined assisted B hip abd/add 2 sets of 10 reps  Education provided throughout session via VC/TC and demonstration to facilitate movement at target joints and correct muscle activation for all testing and exercises performed.    Patient was minimally late for arrival and treatment was modified including primarily seated and reclined LE strengthening today. Patient was able to participate well and able to complete all requested  sets/reps with short rest breaks. Pt will continue to benefit from skilled PT intervention to improve his overall function, weight shifting and postiion change ability, LE strength, trunk strength and improve his overall QOL.                       PT Education - 04/15/21 1712     Education Details exercise technique    Person(s) Educated Patient    Methods Explanation;Demonstration;Tactile cues;Verbal cues    Comprehension Verbalized understanding;Returned demonstration;Verbal cues required;Tactile cues required;Need further instruction              PT Short Term Goals - 03/15/21 1724       PT SHORT TERM GOAL #1   Title Pt will be independent with HEP in order to improve strength and balance in order to decrease fall risk and improve function at home and work.    Baseline 01/04/2021= No formal HEP in place; 12/12 no HEP in place    Time 6    Period Weeks    Status On-going    Target Date 02/15/21               PT Long Term Goals - 03/15/21 1725       PT LONG TERM GOAL #1   Title Patient will increase BLE gross strength by 1/2 muscle grade to improve functional strength for improved independence with potential gait, increased standing tolerance and increased ADL ability.    Baseline 01/04/2021- Patient presents with 1/5 to 3-/5 B LE strength with MMT; 12/12: goal partially met for Left knee/hip    Time 12    Period Weeks    Status On-going    Target Date 03/29/21      PT LONG TERM GOAL #2   Title Patient will tolerate sitting unsupported demonstrating erect sitting posture for 15 minutes with CGA to demonstrate improved back extensor strength and improved sitting tolerance.    Baseline 01/04/2021- Patient confied to sitting in lift chair or electric power chair with back support and unable to sit upright without physical assistance; 12/12: tolerates <1 minutes upright unsupported sitting.    Time 12    Period Weeks    Status On-going    Target Date  03/29/21      PT LONG TERM GOAL #3   Title Patient will demonstrate ability to perform static standing in // bars > 2 min with Max Assist  without loss of balance and fair posture for improved overall strength for pre-gait and transfer activities.    Baseline 01/04/2021= Patient current uanble to stand- Dependent on hoyer or sit to stand lift for transfers.    Time 12    Period Weeks    Status On-going    Target Date 03/29/21  PT LONG TERM GOAL #4   Title Pt will improve FOTO score by 10 points or more demonstrating improved perceived functional ability    Baseline FOTO 7 on 10/17; 03/15/21: FOTO 12    Time 12    Period Weeks    Status On-going    Target Date 03/29/21                   Plan - 04/14/21 1714     Clinical Impression Statement Patient was minimally late for arrival and treatment was modified including primarily seated and reclined LE strengthening today. Patient was able to participate well and able to complete all requested sets/reps with short rest breaks. Pt will continue to benefit from skilled PT intervention to improve his overall function, weight shifting and postiion change ability, LE strength, trunk strength and improve his overall QOL    Personal Factors and Comorbidities Comorbidity 3+;Time since onset of injury/illness/exacerbation    Comorbidities CVA, diabetes, Seizures    Examination-Activity Limitations Bathing;Bed Mobility;Bend;Caring for Others;Carry;Dressing;Hygiene/Grooming;Lift;Locomotion Level;Reach Overhead;Self Feeding;Sit;Squat;Stairs;Stand;Transfers;Toileting    Examination-Participation Restrictions Cleaning;Community Activity;Driving;Laundry;Medication Management;Meal Prep;Occupation;Personal Finances;Shop;Yard Work;Volunteer    Stability/Clinical Decision Making Evolving/Moderate complexity    Rehab Potential Fair    PT Frequency 2x / week    PT Duration 12 weeks    PT Treatment/Interventions ADLs/Self Care Home  Management;Cryotherapy;Electrical Stimulation;Moist Heat;Ultrasound;DME Instruction;Gait training;Stair training;Functional mobility training;Therapeutic exercise;Balance training;Patient/family education;Orthotic Fit/Training;Neuromuscular re-education;Wheelchair mobility training;Manual techniques;Passive range of motion;Dry needling;Energy conservation;Taping;Visual/perceptual remediation/compensation;Joint Manipulations    PT Next Visit Plan core strength, postural control, sabina sit to stand if able    PT Home Exercise Plan No changes to HEP today    Consulted and Agree with Plan of Care Patient;Family member/caregiver    Family Member Consulted Dad             Patient will benefit from skilled therapeutic intervention in order to improve the following deficits and impairments:  Abnormal gait, Decreased activity tolerance, Decreased balance, Decreased cognition, Cardiopulmonary status limiting activity, Decreased coordination, Decreased endurance, Decreased knowledge of use of DME, Decreased mobility, Decreased range of motion, Decreased safety awareness, Decreased strength, Difficulty walking, Hypomobility, Impaired perceived functional ability, Impaired flexibility, Impaired UE functional use, Impaired vision/preception, Improper body mechanics, Postural dysfunction, Obesity  Visit Diagnosis: Abnormality of gait and mobility  Difficulty in walking, not elsewhere classified  Muscle weakness (generalized)  Unsteadiness on feet  History of CVA with residual deficit     Problem List Patient Active Problem List   Diagnosis Date Noted   Sepsis due to vancomycin resistant Enterococcus species ([place]) 06/06/2019   SIRS (systemic inflammatory response syndrome) ([place]) 06/05/2019   Acute lower UTI 06/05/2019   VRE (vancomycin-resistant Enterococci) infection 06/05/2019   Anemia 06/05/2019   Skin ulcer of sacrum with necrosis of muscle ([place])    Urinary retention    Type 2 diabetes  mellitus without complication, with long-term current use of insulin ([facility])    Tachycardia    Lower extremity edema    Acute metabolic encephalopathy    Obstructive sleep apnea    Morbid obesity with BMI of 60.0-69.9, adult ([place])    Goals of care, counseling/discussion    Palliative care encounter    Sepsis ([place]) 04/27/2019   H/O insulin dependent diabetes mellitus 04/27/2019   History of CVA with residual deficit 04/27/2019   Seizure disorder ([place]) 04/27/2019   Decubitus ulcer of sacral region, stage 4 ([place]) 04/27/2019    Lewis Moccasin, PT 04/15/2021, 5:23 PM  [place] [facility]  [place] MAIN [facility] SERVICES [place], [place], [address] Phone: [phone]   Fax:  [phone]  Name: Lijah Bourque MRN: [id number] Date of Birth: [birth year]

## 2021-04-19 ENCOUNTER — Ambulatory Visit: Payer: BC Managed Care – PPO

## 2021-04-19 ENCOUNTER — Ambulatory Visit: Payer: BC Managed Care – PPO | Admitting: Occupational Therapy

## 2021-04-19 ENCOUNTER — Encounter: Payer: BC Managed Care – PPO | Admitting: Speech Pathology

## 2021-04-21 ENCOUNTER — Encounter: Payer: BC Managed Care – PPO | Admitting: Speech Pathology

## 2021-04-21 ENCOUNTER — Encounter: Payer: BC Managed Care – PPO | Admitting: Occupational Therapy

## 2021-04-21 ENCOUNTER — Ambulatory Visit: Payer: BC Managed Care – PPO

## 2021-04-22 DIAGNOSIS — G4733 Obstructive sleep apnea (adult) (pediatric): Secondary | ICD-10-CM | POA: Diagnosis not present

## 2021-04-26 ENCOUNTER — Ambulatory Visit: Payer: BC Managed Care – PPO | Admitting: Occupational Therapy

## 2021-04-26 ENCOUNTER — Ambulatory Visit: Payer: BC Managed Care – PPO

## 2021-04-26 ENCOUNTER — Other Ambulatory Visit: Payer: Self-pay

## 2021-04-26 ENCOUNTER — Encounter: Payer: BC Managed Care – PPO | Admitting: Speech Pathology

## 2021-04-26 ENCOUNTER — Encounter: Payer: Self-pay | Admitting: Occupational Therapy

## 2021-04-26 DIAGNOSIS — M6281 Muscle weakness (generalized): Secondary | ICD-10-CM

## 2021-04-26 DIAGNOSIS — R278 Other lack of coordination: Secondary | ICD-10-CM

## 2021-04-26 DIAGNOSIS — R269 Unspecified abnormalities of gait and mobility: Secondary | ICD-10-CM | POA: Diagnosis not present

## 2021-04-26 DIAGNOSIS — R262 Difficulty in walking, not elsewhere classified: Secondary | ICD-10-CM | POA: Diagnosis not present

## 2021-04-26 DIAGNOSIS — R2681 Unsteadiness on feet: Secondary | ICD-10-CM

## 2021-04-26 DIAGNOSIS — I6930 Unspecified sequelae of cerebral infarction: Secondary | ICD-10-CM | POA: Diagnosis not present

## 2021-04-26 DIAGNOSIS — R2689 Other abnormalities of gait and mobility: Secondary | ICD-10-CM | POA: Diagnosis not present

## 2021-04-26 NOTE — Therapy (Signed)
[place] MAIN [facility] SERVICES [address] [place], [place], [address] Phone: [phone]   Fax:  [phone]  Physical Therapy Treatment  Patient Details  Name: Paul Hall MRN: [id number] Date of Birth: [birth year] Referring Provider (PT): Collene Mares, PA-C   Encounter Date: 04/26/2021   PT End of Session - 04/26/21 1443     Visit Number 16    Number of Visits 25    Date for PT Re-Evaluation 06/16/21    Authorization Type [employer] and [employer] Medicaid- Need auth past 12th visit    Authorization Time Period 01/04/21-03/29/21; Recert [id number]/20/2334-3/56/8616    Progress Note Due on Visit 20    PT Start Time 1515    PT Stop Time 1559    PT Time Calculation (min) 44 min    Equipment Utilized During Treatment Other (comment)   Hoyer lift   Activity Tolerance Patient tolerated treatment well   queasiness/nausea   Behavior During Therapy WFL for tasks assessed/performed             Past Medical History:  Diagnosis Date   Diabetes mellitus ([place])    Hypertension    Stroke [facility])     Past Surgical History:  Procedure Laterality Date   IVC FILTER PLACEMENT ([place] HX)     LEG SURGERY     PEG TUBE PLACEMENT     TRACHEOSTOMY      There were no vitals filed for this visit.   Subjective Assessment - 04/26/21 1442     Subjective Patient reports no new issues and reports eager to work on his strength.    Patient is accompained by: Family member    Pertinent History Mattia Liford is a 69yoM who presents with severe weakness, quadriparesis, altered sensorium, and visual impairment s/p critical illness and prolonged hospitalization. Pt hospitalized in October 2020 with ARDS 2/2 COVID19 infection. Pt sustained a complex and lengthy hospitalization which included tracheostomy, prolonged sedation, ECMO. In this period pt sustained CVA and SDH. Pt has now been liberated from tracheostomy and G-tube. Pt has since been hospitalized for wound  infection and UTI. Pt lives with parents at home, has hospital bed and left chair, hoyer lift transfers, and power WC for mobility needs. Pt needs heavy physical assistance with ADL 2/2 BUE contractures and motor dysfunction.    Limitations Lifting;Standing;Walking;Writing;Reading;Sitting;House hold activities    How long can you sit comfortably? Patient reports uable to sit up long < 4 hours due to buttock discomfort    How long can you stand comfortably? Patient is currently unable to stand    How long can you walk comfortably? Patient is currently unable to walk    Diagnostic tests No recent relavent diagnostic tests    Patient Stated Goals I want to be able to possibly walk again    Currently in Pain? No/denies    Pain Onset More than a month ago    Pain Onset --            INTERVENTIONS:   Pyramid technique for strengthening:   Seated knee ext:  0 lb x 12 reps BLE 2lb. AW x 10 reps on Left; 0lb x 10 reps on right 2.5 lb AW x 8 reps on left; 2lb x 8 reps on right 3 lb. AW x 6 reps on left; 2.5 lb x 6 reps on right   Seated  hip flex 2 sets of 10 reps Seated knee ext:  0 lb x 12 reps BLE 2lb.  AW x 10 reps on Left; 0lb x 10 reps on right 2.5 lb AW x 8 reps on left; 2lb x 8 reps on right 3 lb. AW x 6 reps on left; 2.5 lb x 6 reps on right  Seated chest press with  1/2 in pvc pipe (assist to grip pipe)  12 reps with just PVC pipe 10 reps with 2 lb AW wrapped around pipe    Seated Hip add with ball squeeze x 5 sec x 10 reps   Reclined gluteal sets - hold 5 sec 2 sets of 10 reps Reclined Leg press (manual resistance)  x 10 reps x 2 sets Reclined assisted B hip abd/add 2 sets of 10 reps  Education provided throughout session via VC/TC and demonstration to facilitate movement at target joints and correct muscle activation for all testing and exercises performed.                            PT Short Term Goals - 03/15/21 1724       PT SHORT TERM  GOAL #1   Title Pt will be independent with HEP in order to improve strength and balance in order to decrease fall risk and improve function at home and work.    Baseline 01/04/2021= No formal HEP in place; 12/12 no HEP in place    Time 6    Period Weeks    Status On-going    Target Date 02/15/21               PT Long Term Goals - 03/15/21 1725       PT LONG TERM GOAL #1   Title Patient will increase BLE gross strength by 1/2 muscle grade to improve functional strength for improved independence with potential gait, increased standing tolerance and increased ADL ability.    Baseline 01/04/2021- Patient presents with 1/5 to 3-/5 B LE strength with MMT; 12/12: goal partially met for Left knee/hip    Time 12    Period Weeks    Status On-going    Target Date 03/29/21      PT LONG TERM GOAL #2   Title Patient will tolerate sitting unsupported demonstrating erect sitting posture for 15 minutes with CGA to demonstrate improved back extensor strength and improved sitting tolerance.    Baseline 01/04/2021- Patient confied to sitting in lift chair or electric power chair with back support and unable to sit upright without physical assistance; 12/12: tolerates <1 minutes upright unsupported sitting.    Time 12    Period Weeks    Status On-going    Target Date 03/29/21      PT LONG TERM GOAL #3   Title Patient will demonstrate ability to perform static standing in // bars > 2 min with Max Assist  without loss of balance and fair posture for improved overall strength for pre-gait and transfer activities.    Baseline 01/04/2021= Patient current uanble to stand- Dependent on hoyer or sit to stand lift for transfers.    Time 12    Period Weeks    Status On-going    Target Date 03/29/21      PT LONG TERM GOAL #4   Title Pt will improve FOTO score by 10 points or more demonstrating improved perceived functional ability    Baseline FOTO 7 on 10/17; 03/15/21: FOTO 12    Time 12    Period Weeks     Status On-going  Target Date 03/29/21                   Plan - 04/26/21 1443     Clinical Impression Statement Patient presented with good motivation for today's session focusing on UE/LE strengthening. He was able to participate in a pyramid type workout for some LE strengthening exercises and performed well- able to add more resistance for lower reps to improve his strength. He denied any pain and able to follow all VC well today without difficulty. Pt will continue to benefit from skilled PT intervention to improve his overall function, weight shifting and postiion change ability, LE strength, trunk strength and improve his overall QOL    Personal Factors and Comorbidities Comorbidity 3+;Time since onset of injury/illness/exacerbation    Comorbidities CVA, diabetes, Seizures    Examination-Activity Limitations Bathing;Bed Mobility;Bend;Caring for Others;Carry;Dressing;Hygiene/Grooming;Lift;Locomotion Level;Reach Overhead;Self Feeding;Sit;Squat;Stairs;Stand;Transfers;Toileting    Examination-Participation Restrictions Cleaning;Community Activity;Driving;Laundry;Medication Management;Meal Prep;Occupation;Personal Finances;Shop;Yard Work;Volunteer    Stability/Clinical Decision Making Evolving/Moderate complexity    Rehab Potential Fair    PT Frequency 2x / week    PT Duration 12 weeks    PT Treatment/Interventions ADLs/Self Care Home Management;Cryotherapy;Electrical Stimulation;Moist Heat;Ultrasound;DME Instruction;Gait training;Stair training;Functional mobility training;Therapeutic exercise;Balance training;Patient/family education;Orthotic Fit/Training;Neuromuscular re-education;Wheelchair mobility training;Manual techniques;Passive range of motion;Dry needling;Energy conservation;Taping;Visual/perceptual remediation/compensation;Joint Manipulations    PT Next Visit Plan core strength, postural control, sabina sit to stand if able    PT Home Exercise Plan No changes to HEP today     Consulted and Agree with Plan of Care Patient;Family member/caregiver    Family Member Consulted Dad             Patient will benefit from skilled therapeutic intervention in order to improve the following deficits and impairments:  Abnormal gait, Decreased activity tolerance, Decreased balance, Decreased cognition, Cardiopulmonary status limiting activity, Decreased coordination, Decreased endurance, Decreased knowledge of use of DME, Decreased mobility, Decreased range of motion, Decreased safety awareness, Decreased strength, Difficulty walking, Hypomobility, Impaired perceived functional ability, Impaired flexibility, Impaired UE functional use, Impaired vision/preception, Improper body mechanics, Postural dysfunction, Obesity  Visit Diagnosis: Abnormality of gait and mobility  Muscle weakness (generalized)  Difficulty in walking, not elsewhere classified  Unsteadiness on feet     Problem List Patient Active Problem List   Diagnosis Date Noted   Sepsis due to vancomycin resistant Enterococcus species ([place]) 06/06/2019   SIRS (systemic inflammatory response syndrome) ([place]) 06/05/2019   Acute lower UTI 06/05/2019   VRE (vancomycin-resistant Enterococci) infection 06/05/2019   Anemia 06/05/2019   Skin ulcer of sacrum with necrosis of muscle ([place])    Urinary retention    Type 2 diabetes mellitus without complication, with long-term current use of insulin ([facility])    Tachycardia    Lower extremity edema    Acute metabolic encephalopathy    Obstructive sleep apnea    Morbid obesity with BMI of 60.0-69.9, adult ([facility])    Goals of care, counseling/discussion    Palliative care encounter    Sepsis ([place]) 04/27/2019   H/O insulin dependent diabetes mellitus 04/27/2019   History of CVA with residual deficit 04/27/2019   Seizure disorder ([place]) 04/27/2019   Decubitus ulcer of sacral region, stage 4 ([place]) 04/27/2019    Lewis Moccasin, PT 04/27/2021, 12:00 PM  [place] MAIN [facility] SERVICES [address] [place], [place], [address] Phone: [phone]   Fax:  [phone]  Name: Deep Bonawitz MRN: [id number] Date of Birth: [birth year]

## 2021-04-26 NOTE — Therapy (Signed)
[place] MAIN [facility] SERVICES [address] [place], [place], [address] Phone: [phone]   Fax:  [phone]  Occupational Therapy Treatment  Patient Details  Name: Paul Hall MRN: [id number] Date of Birth: 1995/11/27 Referring Provider (OT): Collene Mares   Encounter Date: 04/26/2021   OT End of Session - 04/26/21 1656     Visit Number 26    Number of Visits 48    Date for OT Re-Evaluation 06/15/21    Authorization Type Progress report period starting 03/17/2021    OT Start Time 1603    OT Stop Time 1648    OT Time Calculation (min) 45 min    Activity Tolerance Patient tolerated treatment well    Behavior During Therapy [facility] for tasks assessed/performed             Past Medical History:  Diagnosis Date   Diabetes mellitus ([place])    Hypertension    Stroke [facility])     Past Surgical History:  Procedure Laterality Date   IVC FILTER PLACEMENT ([facility] HX)     LEG SURGERY     PEG TUBE PLACEMENT     TRACHEOSTOMY      There were no vitals filed for this visit.   Subjective Assessment - 04/26/21 1656     Subjective  Pt reports feeling better, after having a bad cold last week.   Patient is accompanied by: Family member    Pertinent History Pt. is a 26 y.o. male who was hospitalized, and diagnosed with COVID-19, sustained a CVA with resultant quadreplegia in 01/2019. Pt. was hospitalized with VRE  UTI. Pt. PMHx includes: urinary retention, Seizure Disorder, Obstructive Sleep Disorder, DM Type II, and Morbid obesity. Pt. resides at home with his parents.    Currently in Pain? No/denies                OT TREATMENT     Estim attended:   Pt. tolerated neuromuscular estim  for 8 min., cycle time 10/10,  duty cycle 50%. Applied to the right wrist, and digit extensors, followed by  8 min. Applied to the left wrist, and digit extensors with the intensity set to 36, and constant monitoring 100% of the time. Manual assist  was required for wrist and digit extension with each rep. To hold the position.    Therapeutic Exercise:   Pt. tolerated bilateral wrist flexion, extension, and MP, PIP, and DIP flexion, and extension. Patient tolerated moist heat modality for 5 min. to his bilateral hands prior to range of motion.   Pt. was late for the session coming from the cafeteria for lunch during the break between sessions. Pt. tolerated ROM well to the bilateral UEs. Pt. Reports pain in the left wrist initially with ROM, however improved with ROM. Pt. maintains his bilateral wrists, and digit MPs, PIPs, and DIPs in flexion at rest. Pt. continues to tolerate Estim well to the right, and left wrist, and digit extensors with manual assist. Pt. tolerated UE strengthening with wrist weights. Patient continues to work on normalizing tone in the bilateral upper extremities in order to improve range of motion, and facilitate active range of motion in order to increase engagement in ADLs and IADL tasks.                        OT Education - 04/26/21 1656     Education Details BUE AROM    Person(s) Educated Patient  Methods Verbal cues;Tactile cues    Comprehension Verbalized understanding;Verbal cues required;Need further instruction;Returned demonstration                 OT Long Term Goals - 03/22/21 1437       OT LONG TERM GOAL #1   Title Pt. will improve FOTO score by 5 points for clinically significant improvements during ADLs, and IADLs,    Baseline 21st: FOTO 43. 10th Visit: FOTO score 42 Eval: FOTO score: 41    Time 12    Period Weeks    Status Revised    Target Date 06/14/21      OT LONG TERM GOAL #2   Title Pt. will improve right shoulder flexion by 10 degrees to assist caregivers with UE dressing    Baseline Pt. continues to require assist with UE dressing secondary to limited ROM.    Time 12    Period Weeks    Status Achieved    Target Date 03/24/21      OT LONG TERM GOAL #3    Title Pt. will improve right Abduction by 10 degrees to assist caregivers with underarm hygiene care.    Baseline Eval: AROM abduction RUE 76. Pt. continues to present with limited bilateral abduction. 21st: AROM abduction RUE 80.    Time 12    Period Weeks    Status On-going    Target Date 06/14/21      OT LONG TERM GOAL #4   Title Pt. will demonstrate visual compensatory strategies for navigating through his environment.    Baseline 20t: pt continues to require assist to navigate environment, minimal knowledge of strategies. Eval: Limited    Time 12    Period Weeks    Status On-going    Target Date 06/14/21      OT LONG TERM GOAL #5   Title Pt. will demonstrate visual compensatory strategies for tabletop tasks within his near space.    Baseline Pt. continues to present with limited vision. 21st: Pt can identify objects on tabletop ~waterbottle sized, improves with contrasting colours, cannot ID small onjects    Time 12    Period Weeks    Status On-going    Target Date 06/14/21      Long Term Additional Goals   Additional Long Term Goals Yes      OT LONG TERM GOAL #6   Title Pt. will perform hand to mouth patterns with min A in preparation for self-feeding.    Baseline Pt. is able to perfrom hand to mouth patterns, however reports weakness in his hand  limits a secure grasp on the utensi Pt. requires assist to perform. Eval: Pt. has difficulty    Time 12    Period Weeks    Status Achieved    Target Date 03/24/21      OT LONG TERM GOAL #7   Title Pt. will perform one step self-grooming tasks with minA.    Baseline Pt. has difficulty sustaining the UEs in elevation for the duration required to perfrom self-grooming tasks thoroughly.Eval: pt. has difficulty    Time 12    Status Achieved    Target Date 03/24/21      OT LONG TERM GOAL #8   Title Pt will complete UB dressing tasks with SETUP and assist to thread over hands only    Baseline 21st: Pt requires MIN A    Time 12     Period Weeks    Status New    Target  Date 06/14/21      OT LONG TERM GOAL  #9   TITLE Pt will improve B grip strength by 5# to assist in handling ADL items    Baseline 21st: R grip 3#, L grip 2#    Time 12    Period Weeks    Status New    Target Date 06/14/21      OT LONG TERM GOAL  #10   TITLE Pt will improve B lateral pinch strength by 3# to assist in UBD.    Baseline 21st: R lateral pinch 4#, L lateral pinch 3#    Time 12    Period Weeks    Status New    Target Date 06/14/21                   Plan - 04/26/21 1657     Clinical Impression Statement Pt. was late for the session coming from the cafeteria for lunch during the break between sessions. Pt. tolerated ROM well to the bilateral UEs. Pt. Reports pain in the left wrist initially with ROM, however improved with ROM. Pt. maintains his bilateral wrists, and digit MPs, PIPs, and DIPs in flexion at rest. Pt. continues to tolerate Estim well to the right, and left wrist, and digit extensors with manual assist. Pt. tolerated UE strengthening with wrist weights. Patient continues to work on normalizing tone in the bilateral upper extremities in order to improve range of motion, and facilitate active range of motion in order to increase engagement in ADLs and IADL tasks.     OT Occupational Profile and History Detailed Assessment- Review of Records and additional review of physical, cognitive, psychosocial history related to current functional performance    Occupational performance deficits (Please refer to evaluation for details): ADL's;IADL's    Body Structure / Function / Physical Skills ADL;FMC;ROM;Dexterity;IADL;Endurance;Obesity;Strength;UE functional use    Rehab Potential Good    Clinical Decision Making Multiple treatment options, significant modification of task necessary    Comorbidities Affecting Occupational Performance: Presence of comorbidities impacting occupational performance    Modification or Assistance  to Complete Evaluation  Max significant modification of tasks or assist is necessary to complete    OT Frequency 2x / week    OT Duration 12 weeks    OT Treatment/Interventions Self-care/ADL training;Therapeutic exercise;Neuromuscular education;Visual/perceptual remediation/compensation;Patient/family education;Therapeutic activities;DME and/or AE instruction;Passive range of motion;Moist Heat;Electrical Stimulation    Consulted and Agree with Plan of Care Patient             Patient will benefit from skilled therapeutic intervention in order to improve the following deficits and impairments:   Body Structure / Function / Physical Skills: ADL, FMC, ROM, Dexterity, IADL, Endurance, Obesity, Strength, UE functional use       Visit Diagnosis: Muscle weakness (generalized)  Other lack of coordination    Problem List Patient Active Problem List   Diagnosis Date Noted   Sepsis due to vancomycin resistant Enterococcus species ([place]) 06/06/2019   SIRS (systemic inflammatory response syndrome) ([place]) 06/05/2019   Acute lower UTI 06/05/2019   VRE (vancomycin-resistant Enterococci) infection 06/05/2019   Anemia 06/05/2019   Skin ulcer of sacrum with necrosis of muscle (HCC)    Urinary retention    Type 2 diabetes mellitus without complication, with long-term current use of insulin (HCC)    Tachycardia    Lower extremity edema    Acute metabolic encephalopathy    Obstructive sleep apnea    Morbid obesity with BMI of 60.0-69.9, adult ([place])  Goals of care, counseling/discussion    Palliative care encounter    Sepsis ([place]) 04/27/2019   H/O insulin dependent diabetes mellitus 04/27/2019   History of CVA with residual deficit 04/27/2019   Seizure disorder ([place]) 04/27/2019   Decubitus ulcer of sacral region, stage 4 ([place]) 04/27/2019    Harrel Carina, MS, OTR/L 04/26/2021, 5:10 PM  [place] MAIN [facility] SERVICES [address]  [place], [place], [address] Phone: [phone]   Fax:  [phone]  Name: Paul Hall MRN: [id number] Date of Birth: 21-Jul-1995

## 2021-04-27 DIAGNOSIS — G4733 Obstructive sleep apnea (adult) (pediatric): Secondary | ICD-10-CM | POA: Diagnosis not present

## 2021-04-27 DIAGNOSIS — I6389 Other cerebral infarction: Secondary | ICD-10-CM | POA: Diagnosis not present

## 2021-04-27 DIAGNOSIS — L8994 Pressure ulcer of unspecified site, stage 4: Secondary | ICD-10-CM | POA: Diagnosis not present

## 2021-04-28 ENCOUNTER — Ambulatory Visit: Payer: BC Managed Care – PPO

## 2021-04-28 ENCOUNTER — Encounter: Payer: BC Managed Care – PPO | Admitting: Speech Pathology

## 2021-04-28 ENCOUNTER — Encounter: Payer: Self-pay | Admitting: Occupational Therapy

## 2021-04-28 ENCOUNTER — Ambulatory Visit: Payer: BC Managed Care – PPO | Admitting: Occupational Therapy

## 2021-04-28 ENCOUNTER — Other Ambulatory Visit: Payer: Self-pay

## 2021-04-28 DIAGNOSIS — R269 Unspecified abnormalities of gait and mobility: Secondary | ICD-10-CM | POA: Diagnosis not present

## 2021-04-28 DIAGNOSIS — R2689 Other abnormalities of gait and mobility: Secondary | ICD-10-CM | POA: Diagnosis not present

## 2021-04-28 DIAGNOSIS — I6930 Unspecified sequelae of cerebral infarction: Secondary | ICD-10-CM

## 2021-04-28 DIAGNOSIS — R262 Difficulty in walking, not elsewhere classified: Secondary | ICD-10-CM

## 2021-04-28 DIAGNOSIS — R2681 Unsteadiness on feet: Secondary | ICD-10-CM | POA: Diagnosis not present

## 2021-04-28 DIAGNOSIS — M6281 Muscle weakness (generalized): Secondary | ICD-10-CM

## 2021-04-28 DIAGNOSIS — R278 Other lack of coordination: Secondary | ICD-10-CM

## 2021-04-28 NOTE — Therapy (Signed)
Paul Hall [facility] SERVICES [address] [place], [place], [address] Phone: [phone]   Fax:  [phone]  Physical Therapy Treatment  Patient Details  Name: Paul Hall MRN: [id number] Date of Birth: February 13, 1996 Referring Provider (PT): Collene Mares, PA-C   Encounter Date: 04/28/2021   PT End of Session - 04/28/21 1503     Visit Number 17    Number of Visits 25    Date for PT Re-Evaluation 06/16/21    Authorization Type [employer] and [employer]- Need auth past 12th visit    Authorization Time Period 01/04/21-03/29/21; Recert [id number]/05/[id number]    Progress Note Due on Visit 20    PT Start Time 1514    PT Stop Time 1559    PT Time Calculation (min) 45 min    Equipment Utilized During Treatment Other (comment)   Hoyer lift   Activity Tolerance Patient tolerated treatment well   queasiness/nausea   Behavior During Therapy WFL for tasks assessed/performed             Past Medical History:  Diagnosis Date   Diabetes mellitus ([place])    Hypertension    Stroke [name]     Past Surgical History:  Procedure Laterality Date   IVC FILTER PLACEMENT ([facility] HX)     LEG SURGERY     PEG TUBE PLACEMENT     TRACHEOSTOMY      There were no vitals filed for this visit.   Subjective Assessment - 04/28/21 1517     Subjective Patient reports doing okay today- not too sore from last visit and states going to receive Botox in his hands/arms tomorrow.    Patient is accompained by: Family member    Pertinent History [name] is a 48yoM who presents with severe weakness, quadriparesis, altered sensorium, and visual impairment s/p critical illness and prolonged hospitalization. Pt hospitalized in October 2020 with ARDS 2/2 COVID19 infection. Pt sustained a complex and lengthy hospitalization which included tracheostomy, prolonged sedation, ECMO. In this period pt sustained CVA and SDH. Pt has now been liberated from tracheostomy  and G-tube. Pt has since been hospitalized for wound infection and UTI. Pt lives with parents at home, has hospital bed and left chair, hoyer lift transfers, and power WC for mobility needs. Pt needs heavy physical assistance with ADL 2/2 BUE contractures and motor dysfunction.    Limitations Lifting;Standing;Walking;Writing;Reading;Sitting;House hold activities    How long can you sit comfortably? Patient reports uable to sit up long < 4 hours due to buttock discomfort    How long can you stand comfortably? Patient is currently unable to stand    How long can you walk comfortably? Patient is currently unable to walk    Diagnostic tests No recent relavent diagnostic tests    Patient Stated Goals I want to be able to possibly walk again    Currently in Pain? No/denies                INTERVENTIONS:    Therapeutic Exercises:   Pyramid technique for strengthening:    Seated knee ext:   0 lb x 12 reps BLE 2.5 lb. AW x 10 reps on Left; 2 lb x 10 reps on right 3.0  lb AW x 8 reps on left; 2.5 lb x 8 reps on right 4 lb. AW x 6 reps on left; 3 lb x 6 reps on right  Seated  hip flex  0 lb x 12 reps BLE  2.5 lb.  AW x 10 reps on Left; 2 lb x 10 reps on right 3.0  lb AW x 8 reps on left; 2.5 lb x 8 reps on right 4 lb. AW x 6 reps on left; 3 lb x 6 reps on right  Manual resistive Hip abd/add x 3 sec hold x 2 sets of 10 reps each.   Postural strengthening:   Scapular rows using GTB today- 2 sets of 12 reps- VC for technique and Physical assist to keep back straight and sitting erect without back support for more core strength.   Boxing:   Tracking moving glove with left eye and punching his left arm toward glove then his right arm (as able with limited mobility due to tone). Patient was able to track moving glove with his left eye and appropriately punch the glove for several punches- Limited by weakness and fatigue.   Education provided throughout session via VC/TC and demonstration  to facilitate movement at target joints and correct muscle activation for all testing and exercises performed.                              PT Education - 04/29/21 1503     Education Details Exercise technique    Person(s) Educated Patient    Methods Explanation;Demonstration;Tactile cues;Verbal cues    Comprehension Verbalized understanding;Returned demonstration;Verbal cues required;Tactile cues required;Need further instruction              PT Short Term Goals - 03/15/21 1724       PT SHORT TERM GOAL #1   Title Pt will be independent with HEP in order to improve strength and balance in order to decrease fall risk and improve function at home and work.    Baseline 01/04/2021= No formal HEP in place; 12/12 no HEP in place    Time 6    Period Weeks    Status On-going    Target Date 02/15/21               PT Long Term Goals - 03/15/21 1725       PT LONG TERM GOAL #1   Title Patient will increase BLE gross strength by 1/2 muscle grade to improve functional strength for improved independence with potential gait, increased standing tolerance and increased ADL ability.    Baseline 01/04/2021- Patient presents with 1/5 to 3-/5 B LE strength with MMT; 12/12: goal partially met for Left knee/hip    Time 12    Period Weeks    Status On-going    Target Date 03/29/21      PT LONG TERM GOAL #2   Title Patient will tolerate sitting unsupported demonstrating erect sitting posture for 15 minutes with CGA to demonstrate improved back extensor strength and improved sitting tolerance.    Baseline 01/04/2021- Patient confied to sitting in lift chair or electric power chair with back support and unable to sit upright without physical assistance; 12/12: tolerates <1 minutes upright unsupported sitting.    Time 12    Period Weeks    Status On-going    Target Date 03/29/21      PT LONG TERM GOAL #3   Title Patient will demonstrate ability to perform static standing  in // bars > 2 min with Max Assist  without loss of balance and fair posture for improved overall strength for pre-gait and transfer activities.    Baseline 01/04/2021= Patient current uanble to stand- Dependent on hoyer  or sit to stand lift for transfers.    Time 12    Period Weeks    Status On-going    Target Date 03/29/21      PT LONG TERM GOAL #4   Title Pt will improve FOTO score by 10 points or more demonstrating improved perceived functional ability    Baseline FOTO 7 on 10/17; 03/15/21: FOTO 12    Time 12    Period Weeks    Status On-going    Target Date 03/29/21                   Plan - 04/28/21 1503     Clinical Impression Statement Patient presents with good motivation for all activities today and able to progress with pyramid technique for strengthening. He was excited to note some progress and stated he enjoyed the boxing activity and surprised at how well he could track to object to punch today. He was able to to hold his erect seated position while rowing demo improved core and posture. Pt will continue to benefit from skilled PT intervention to improve his overall function, weight shifting and postiion change ability, LE strength, trunk strength and improve his overall QOL    Personal Factors and Comorbidities Comorbidity 3+;Time since onset of injury/illness/exacerbation    Comorbidities CVA, diabetes, Seizures    Examination-Activity Limitations Bathing;Bed Mobility;Bend;Caring for Others;Carry;Dressing;Hygiene/Grooming;Lift;Locomotion Level;Reach Overhead;Self Feeding;Sit;Squat;Stairs;Stand;Transfers;Toileting    Examination-Participation Restrictions Cleaning;Community Activity;Driving;Laundry;Medication Management;Meal Prep;Occupation;Personal Finances;Shop;Yard Work;Volunteer    Stability/Clinical Decision Making Evolving/Moderate complexity    Rehab Potential Fair    PT Frequency 2x / week    PT Duration 12 weeks    PT Treatment/Interventions ADLs/Self Care  Home Management;Cryotherapy;Electrical Stimulation;Moist Heat;Ultrasound;DME Instruction;Gait training;Stair training;Functional mobility training;Therapeutic exercise;Balance training;Patient/family education;Orthotic Fit/Training;Neuromuscular re-education;Wheelchair mobility training;Manual techniques;Passive range of motion;Dry needling;Energy conservation;Taping;Visual/perceptual remediation/compensation;Joint Manipulations    PT Next Visit Plan core strength, postural control, sabina sit to stand if able; Continue with progressive LE Strengthening    PT Home Exercise Plan No changes to HEP today    Consulted and Agree with Plan of Care Patient;Family member/caregiver    Family Member Consulted Dad             Patient will benefit from skilled therapeutic intervention in order to improve the following deficits and impairments:  Abnormal gait, Decreased activity tolerance, Decreased balance, Decreased cognition, Cardiopulmonary status limiting activity, Decreased coordination, Decreased endurance, Decreased knowledge of use of DME, Decreased mobility, Decreased range of motion, Decreased safety awareness, Decreased strength, Difficulty walking, Hypomobility, Impaired perceived functional ability, Impaired flexibility, Impaired UE functional use, Impaired vision/preception, Improper body mechanics, Postural dysfunction, Obesity  Visit Diagnosis: Muscle weakness (generalized)  Difficulty in walking, not elsewhere classified  Other abnormalities of gait and mobility  History of CVA with residual deficit     Problem List Patient Active Problem List   Diagnosis Date Noted   Sepsis due to vancomycin resistant Enterococcus species ([place]) 06/06/2019   SIRS (systemic inflammatory response syndrome) ([place]) 06/05/2019   Acute lower UTI 06/05/2019   VRE (vancomycin-resistant Enterococci) infection 06/05/2019   Anemia 06/05/2019   Skin ulcer of sacrum with necrosis of muscle (HCC)    Urinary  retention    Type 2 diabetes mellitus without complication, with long-term current use of insulin (HCC)    Tachycardia    Lower extremity edema    Acute metabolic encephalopathy    Obstructive sleep apnea    Morbid obesity with BMI of 60.0-69.9, adult (HCC)    Goals of care,  counseling/discussion    Palliative care encounter    Sepsis ([place]) 04/27/2019   H/O insulin dependent diabetes mellitus 04/27/2019   History of CVA with residual deficit 04/27/2019   Seizure disorder ([place]) 04/27/2019   Decubitus ulcer of sacral region, stage 4 ([place]) 04/27/2019    Lewis Moccasin, PT 04/29/2021, 3:23 PM  [place] [facility] SERVICES [address] [place], [place], [address] Phone: [phone]   Fax:  [phone]  Name: Paul Hall MRN: [id number] Date of Birth: 06-13-1995

## 2021-04-28 NOTE — Therapy (Signed)
[place] MAIN [facility] SERVICES [address] [place], [place], [address] Phone: [phone]   Fax:  [phone]  Occupational Therapy Treatment  Patient Details  Name: Paul Hall MRN: [id number] Date of Birth: 09/28/1995 Referring Provider (OT): Collene Mares   Encounter Date: 04/28/2021   OT End of Session - 04/28/21 1725     Visit Number 27    Number of Visits 24    Date for OT Re-Evaluation 06/15/21    Authorization Type Progress report period starting 03/17/2021    OT Start Time 1605    OT Stop Time 1650    OT Time Calculation (min) 45 min    Activity Tolerance Patient tolerated treatment well    Behavior During Therapy [facility] for tasks assessed/performed             Past Medical History:  Diagnosis Date   Diabetes mellitus ([place])    Hypertension    Stroke [facility])     Past Surgical History:  Procedure Laterality Date   IVC FILTER PLACEMENT ([facility] HX)     LEG SURGERY     PEG TUBE PLACEMENT     TRACHEOSTOMY      There were no vitals filed for this visit.   Subjective Assessment - 04/28/21 1724     Subjective  Pt. Reports that he is scheduled to have Botox tomorrow.   Patient is accompanied by: Family member    Pertinent History Pt. is a 26 y.o. male who was hospitalized, and diagnosed with COVID-19, sustained a CVA with resultant quadreplegia in 01/2019. Pt. was hospitalized with VRE  UTI. Pt. PMHx includes: urinary retention, Seizure Disorder, Obstructive Sleep Disorder, DM Type II, and Morbid obesity. Pt. resides at home with his parents.    Currently in Pain? No/denies            OT TREATMENT     Estim attended:   Pt. tolerated neuromuscular estim  for 5 min., cycle time 10/10,  duty cycle 50%. Applied to the right wrist, and digit extensors, followed by  8 min. Applied to the left wrist, and digit extensors with the intensity set to 44, and constant monitoring 100% of the time. Manual assist was required  for wrist and digit extension with each rep. To hold the position.    Therapeutic Exercise:   Pt. tolerated bilateral wrist flexion, extension, and MP, PIP, and DIP flexion, and extension. Patient tolerated moist heat modality for 5 min. to his bilateral hands prior to range of motion. Pt. Performed there. Ex with 1# cuff weight for right elbow flexion, and extension. 2# cuff weight with shoulder flexion, abduction elbow flexion  Pt. reports that he has been scheduled for Botox tomorrow morning. Pt. tolerated ROM well to the bilateral UEs. Pt. Reports pain in the left wrist initially with ROM, however improved with ROM. Pt. maintains his bilateral wrists, and digit MPs, PIPs, and DIPs in flexion at rest. Pt. continues to tolerate Estim well to the right, and left wrist, and digit extensors with manual assist. Pt. tolerated UE strengthening with wrist weights. Patient continues to work on normalizing tone in the bilateral upper extremities in order to improve range of motion, and facilitate active range of motion in order to increase engagement in ADLs and IADL tasks.                        OT Education - 04/28/21 1724     Education Details  BUE AROM    Person(s) Educated Patient    Methods Verbal cues;Tactile cues    Comprehension Verbalized understanding;Verbal cues required;Need further instruction;Returned demonstration                 OT Long Term Goals - 03/22/21 1437       OT LONG TERM GOAL #1   Title Pt. will improve FOTO score by 5 points for clinically significant improvements during ADLs, and IADLs,    Baseline 21st: FOTO 43. 10th Visit: FOTO score 42 Eval: FOTO score: 41    Time 12    Period Weeks    Status Revised    Target Date 06/14/21      OT LONG TERM GOAL #2   Title Pt. will improve right shoulder flexion by 10 degrees to assist caregivers with UE dressing    Baseline Pt. continues to require assist with UE dressing secondary to limited ROM.     Time 12    Period Weeks    Status Achieved    Target Date 03/24/21      OT LONG TERM GOAL #3   Title Pt. will improve right Abduction by 10 degrees to assist caregivers with underarm hygiene care.    Baseline Eval: AROM abduction RUE 76. Pt. continues to present with limited bilateral abduction. 21st: AROM abduction RUE 80.    Time 12    Period Weeks    Status On-going    Target Date 06/14/21      OT LONG TERM GOAL #4   Title Pt. will demonstrate visual compensatory strategies for navigating through his environment.    Baseline 20t: pt continues to require assist to navigate environment, minimal knowledge of strategies. Eval: Limited    Time 12    Period Weeks    Status On-going    Target Date 06/14/21      OT LONG TERM GOAL #5   Title Pt. will demonstrate visual compensatory strategies for tabletop tasks within his near space.    Baseline Pt. continues to present with limited vision. 21st: Pt can identify objects on tabletop ~waterbottle sized, improves with contrasting colours, cannot ID small onjects    Time 12    Period Weeks    Status On-going    Target Date 06/14/21      Long Term Additional Goals   Additional Long Term Goals Yes      OT LONG TERM GOAL #6   Title Pt. will perform hand to mouth patterns with min A in preparation for self-feeding.    Baseline Pt. is able to perfrom hand to mouth patterns, however reports weakness in his hand  limits a secure grasp on the utensi Pt. requires assist to perform. Eval: Pt. has difficulty    Time 12    Period Weeks    Status Achieved    Target Date 03/24/21      OT LONG TERM GOAL #7   Title Pt. will perform one step self-grooming tasks with minA.    Baseline Pt. has difficulty sustaining the UEs in elevation for the duration required to perfrom self-grooming tasks thoroughly.Eval: pt. has difficulty    Time 12    Status Achieved    Target Date 03/24/21      OT LONG TERM GOAL #8   Title Pt will complete UB dressing  tasks with SETUP and assist to thread over hands only    Baseline 21st: Pt requires MIN A    Time 12  Period Weeks    Status New    Target Date 06/14/21      OT LONG TERM GOAL  #9   TITLE Pt will improve B grip strength by 5# to assist in handling ADL items    Baseline 21st: R grip 3#, L grip 2#    Time 12    Period Weeks    Status New    Target Date 06/14/21      OT LONG TERM GOAL  #10   TITLE Pt will improve B lateral pinch strength by 3# to assist in UBD.    Baseline 21st: R lateral pinch 4#, L lateral pinch 3#    Time 12    Period Weeks    Status New    Target Date 06/14/21                   Plan - 04/28/21 1725     Clinical Impression Statement Pt. reports that he has been scheduled for Botox tomorrow morning. Pt. tolerated ROM well to the bilateral UEs. Pt. Reports pain in the left wrist initially with ROM, however improved with ROM. Pt. maintains his bilateral wrists, and digit MPs, PIPs, and DIPs in flexion at rest. Pt. continues to tolerate Estim well to the right, and left wrist, and digit extensors with manual assist. Pt. tolerated UE strengthening with wrist weights. Patient continues to work on normalizing tone in the bilateral upper extremities in order to improve range of motion, and facilitate active range of motion in order to increase engagement in ADLs and IADL tasks.       OT Occupational Profile and History Detailed Assessment- Review of Records and additional review of physical, cognitive, psychosocial history related to current functional performance    Occupational performance deficits (Please refer to evaluation for details): ADL's;IADL's    Body Structure / Function / Physical Skills ADL;FMC;ROM;Dexterity;IADL;Endurance;Obesity;Strength;UE functional use    Rehab Potential Good    Clinical Decision Making Multiple treatment options, significant modification of task necessary    Comorbidities Affecting Occupational Performance: Presence of  comorbidities impacting occupational performance    Modification or Assistance to Complete Evaluation  Max significant modification of tasks or assist is necessary to complete    OT Frequency 2x / week    OT Duration 12 weeks    OT Treatment/Interventions Self-care/ADL training;Therapeutic exercise;Neuromuscular education;Visual/perceptual remediation/compensation;Patient/family education;Therapeutic activities;DME and/or AE instruction;Passive range of motion;Moist Heat;Electrical Stimulation    Consulted and Agree with Plan of Care Patient             Patient will benefit from skilled therapeutic intervention in order to improve the following deficits and impairments:   Body Structure / Function / Physical Skills: ADL, FMC, ROM, Dexterity, IADL, Endurance, Obesity, Strength, UE functional use       Visit Diagnosis: Muscle weakness (generalized)  Other lack of coordination    Problem List Patient Active Problem List   Diagnosis Date Noted   Sepsis due to vancomycin resistant Enterococcus species ([place]) 06/06/2019   SIRS (systemic inflammatory response syndrome) ([place]) 06/05/2019   Acute lower UTI 06/05/2019   VRE (vancomycin-resistant Enterococci) infection 06/05/2019   Anemia 06/05/2019   Skin ulcer of sacrum with necrosis of muscle ([facility])    Urinary retention    Type 2 diabetes mellitus without complication, with long-term current use of insulin ([facility])    Tachycardia    Lower extremity edema    Acute metabolic encephalopathy    Obstructive sleep apnea    Morbid  obesity with BMI of 60.0-69.9, adult ([place])    Goals of care, counseling/discussion    Palliative care encounter    Sepsis ([place]) 04/27/2019   H/O insulin dependent diabetes mellitus 04/27/2019   History of CVA with residual deficit 04/27/2019   Seizure disorder ([place]) 04/27/2019   Decubitus ulcer of sacral region, stage 4 ([place]) 04/27/2019    Harrel Carina, MS, OTR/L 04/28/2021, 5:27 PM  [place] [address] [place], [place], [address] Phone: [phone]   Fax:  [phone]  Name: Paul Hall MRN: [id number] Date of Birth: [birth year]

## 2021-04-29 DIAGNOSIS — Z8673 Personal history of transient ischemic attack (TIA), and cerebral infarction without residual deficits: Secondary | ICD-10-CM | POA: Diagnosis not present

## 2021-04-29 DIAGNOSIS — M2450 Contracture, unspecified joint: Secondary | ICD-10-CM | POA: Diagnosis not present

## 2021-05-03 ENCOUNTER — Ambulatory Visit: Payer: BC Managed Care – PPO | Admitting: Occupational Therapy

## 2021-05-03 ENCOUNTER — Ambulatory Visit: Payer: BC Managed Care – PPO

## 2021-05-03 ENCOUNTER — Encounter: Payer: BC Managed Care – PPO | Admitting: Speech Pathology

## 2021-05-03 ENCOUNTER — Encounter: Payer: Self-pay | Admitting: Occupational Therapy

## 2021-05-03 ENCOUNTER — Other Ambulatory Visit: Payer: Self-pay

## 2021-05-03 DIAGNOSIS — R262 Difficulty in walking, not elsewhere classified: Secondary | ICD-10-CM | POA: Diagnosis not present

## 2021-05-03 DIAGNOSIS — I6930 Unspecified sequelae of cerebral infarction: Secondary | ICD-10-CM

## 2021-05-03 DIAGNOSIS — R2689 Other abnormalities of gait and mobility: Secondary | ICD-10-CM | POA: Diagnosis not present

## 2021-05-03 DIAGNOSIS — M6281 Muscle weakness (generalized): Secondary | ICD-10-CM | POA: Diagnosis not present

## 2021-05-03 DIAGNOSIS — R2681 Unsteadiness on feet: Secondary | ICD-10-CM | POA: Diagnosis not present

## 2021-05-03 DIAGNOSIS — R269 Unspecified abnormalities of gait and mobility: Secondary | ICD-10-CM

## 2021-05-03 DIAGNOSIS — R278 Other lack of coordination: Secondary | ICD-10-CM | POA: Diagnosis not present

## 2021-05-03 NOTE — Therapy (Signed)
[place] MAIN [facility] SERVICES [address] [place], [place], [address] Phone: [phone]   Fax:  [phone]  Physical Therapy Treatment  Patient Details  Name: Paul Hall MRN: [id number] Date of Birth: [birth year] Referring Provider (PT): Collene Mares, PA-C   Encounter Date: 05/03/2021   PT End of Session - 05/03/21 1618     Visit Number 18    Number of Visits 25    Date for PT Re-Evaluation 06/16/21    Authorization Type [employer] and [employer]- Need auth past 12th visit    Authorization Time Period 01/04/21-03/29/21; Recert 28/76/8115-10/27/2033    Progress Note Due on Visit 20    PT Start Time 1534    PT Stop Time 1602    PT Time Calculation (min) 28 min    Equipment Utilized During Treatment Other (comment)   Hoyer lift   Activity Tolerance Patient tolerated treatment well   queasiness/nausea   Behavior During Therapy WFL for tasks assessed/performed             Past Medical History:  Diagnosis Date   Diabetes mellitus ([place])    Hypertension    Stroke [facility])     Past Surgical History:  Procedure Laterality Date   IVC FILTER PLACEMENT ([place] HX)     LEG SURGERY     PEG TUBE PLACEMENT     TRACHEOSTOMY      There were no vitals filed for this visit.   Subjective Assessment - 05/03/21 1616     Subjective Patient reports no new changes. Reports he did not receive botox last week as expected. Reports the appointment was consultation only and that he is going to receive Botox on 05/21/2021.    Patient is accompained by: Family member    Pertinent History Paul Hall is a 45yoM who presents with severe weakness, quadriparesis, altered sensorium, and visual impairment s/p critical illness and prolonged hospitalization. Pt hospitalized in October 2020 with ARDS 2/2 COVID19 infection. Pt sustained a complex and lengthy hospitalization which included tracheostomy, prolonged sedation, ECMO. In this period pt  sustained CVA and SDH. Pt has now been liberated from tracheostomy and G-tube. Pt has since been hospitalized for wound infection and UTI. Pt lives with parents at home, has hospital bed and left chair, hoyer lift transfers, and power WC for mobility needs. Pt needs heavy physical assistance with ADL 2/2 BUE contractures and motor dysfunction.    Limitations Lifting;Standing;Walking;Writing;Reading;Sitting;House hold activities    How long can you sit comfortably? Patient reports uable to sit up long < 4 hours due to buttock discomfort    How long can you stand comfortably? Patient is currently unable to stand    How long can you walk comfortably? Patient is currently unable to walk    Diagnostic tests No recent relavent diagnostic tests    Patient Stated Goals I want to be able to possibly walk again    Currently in Pain? No/denies              INTERVENTIONS:    Therapeutic Exercises:   Seated hip march (no weight) 2 sets of 12 reps- Minimal height due to weakness.   Seated knee ext (2.5 lb on right and 3 lb on right)  2 sets of 12 reps  Seated hip abd (manual resistance) 2 sets of 12 reps  Seated hip add (squeeze on red ball) 2 sets of 12 reps with 3 sec hold  Seated hamstring curl- Red theraband - 2  sets of 12 reps.   Reclined leg press - manual resistance - 2 sets of 12 reps.   Education provided throughout session via VC/TC and demonstration to facilitate movement at target joints and correct muscle activation for all testing and exercises performed.                            PT Education - 05/03/21 1618     Education Details Exercise technique    Person(s) Educated Patient    Methods Explanation;Demonstration;Tactile cues;Verbal cues    Comprehension Verbalized understanding;Returned demonstration;Verbal cues required;Tactile cues required;Need further instruction              PT Short Term Goals - 03/15/21 1724       PT SHORT TERM GOAL  #1   Title Pt will be independent with HEP in order to improve strength and balance in order to decrease fall risk and improve function at home and work.    Baseline 01/04/2021= No formal HEP in place; 12/12 no HEP in place    Time 6    Period Weeks    Status On-going    Target Date 02/15/21               PT Long Term Goals - 03/15/21 1725       PT LONG TERM GOAL #1   Title Patient will increase BLE gross strength by 1/2 muscle grade to improve functional strength for improved independence with potential gait, increased standing tolerance and increased ADL ability.    Baseline 01/04/2021- Patient presents with 1/5 to 3-/5 B LE strength with MMT; 12/12: goal partially met for Left knee/hip    Time 12    Period Weeks    Status On-going    Target Date 03/29/21      PT LONG TERM GOAL #2   Title Patient will tolerate sitting unsupported demonstrating erect sitting posture for 15 minutes with CGA to demonstrate improved back extensor strength and improved sitting tolerance.    Baseline 01/04/2021- Patient confied to sitting in lift chair or electric power chair with back support and unable to sit upright without physical assistance; 12/12: tolerates <1 minutes upright unsupported sitting.    Time 12    Period Weeks    Status On-going    Target Date 03/29/21      PT LONG TERM GOAL #3   Title Patient will demonstrate ability to perform static standing in // bars > 2 min with Max Assist  without loss of balance and fair posture for improved overall strength for pre-gait and transfer activities.    Baseline 01/04/2021= Patient current uanble to stand- Dependent on hoyer or sit to stand lift for transfers.    Time 12    Period Weeks    Status On-going    Target Date 03/29/21      PT LONG TERM GOAL #4   Title Pt will improve FOTO score by 10 points or more demonstrating improved perceived functional ability    Baseline FOTO 7 on 10/17; 03/15/21: FOTO 12    Time 12    Period Weeks     Status On-going    Target Date 03/29/21                   Plan - 05/03/21 1619     Clinical Impression Statement Treatment was limited secondary to patient arrived late with traffic issues. He was well motivated for today's  session and participated well- No pain reported or observed. He was able to focus on muscle endurance today and able to compete all exercises well without significant difficulty other than weakness. Pt will continue to benefit from skilled PT intervention to improve his overall function, weight shifting and postiion change ability, LE strength, trunk strength and improve his overall QOL    Personal Factors and Comorbidities Comorbidity 3+;Time since onset of injury/illness/exacerbation    Comorbidities CVA, diabetes, Seizures    Examination-Activity Limitations Bathing;Bed Mobility;Bend;Caring for Others;Carry;Dressing;Hygiene/Grooming;Lift;Locomotion Level;Reach Overhead;Self Feeding;Sit;Squat;Stairs;Stand;Transfers;Toileting    Examination-Participation Restrictions Cleaning;Community Activity;Driving;Laundry;Medication Management;Meal Prep;Occupation;Personal Finances;Shop;Yard Work;Volunteer    Stability/Clinical Decision Making Evolving/Moderate complexity    Rehab Potential Fair    PT Frequency 2x / week    PT Duration 12 weeks    PT Treatment/Interventions ADLs/Self Care Home Management;Cryotherapy;Electrical Stimulation;Moist Heat;Ultrasound;DME Instruction;Gait training;Stair training;Functional mobility training;Therapeutic exercise;Balance training;Patient/family education;Orthotic Fit/Training;Neuromuscular re-education;Wheelchair mobility training;Manual techniques;Passive range of motion;Dry needling;Energy conservation;Taping;Visual/perceptual remediation/compensation;Joint Manipulations    PT Next Visit Plan core strength, postural control, sabina sit to stand if able; Continue with progressive LE Strengthening    PT Home Exercise Plan No changes to HEP  today    Consulted and Agree with Plan of Care Patient;Family member/caregiver    Family Member Consulted Dad             Patient will benefit from skilled therapeutic intervention in order to improve the following deficits and impairments:  Abnormal gait, Decreased activity tolerance, Decreased balance, Decreased cognition, Cardiopulmonary status limiting activity, Decreased coordination, Decreased endurance, Decreased knowledge of use of DME, Decreased mobility, Decreased range of motion, Decreased safety awareness, Decreased strength, Difficulty walking, Hypomobility, Impaired perceived functional ability, Impaired flexibility, Impaired UE functional use, Impaired vision/preception, Improper body mechanics, Postural dysfunction, Obesity  Visit Diagnosis: Muscle weakness (generalized)  Abnormality of gait and mobility  Difficulty in walking, not elsewhere classified  Unsteadiness on feet  History of CVA with residual deficit     Problem List Patient Active Problem List   Diagnosis Date Noted   Sepsis due to vancomycin resistant Enterococcus species ([place]) 06/06/2019   SIRS (systemic inflammatory response syndrome) ([place]) 06/05/2019   Acute lower UTI 06/05/2019   VRE (vancomycin-resistant Enterococci) infection 06/05/2019   Anemia 06/05/2019   Skin ulcer of sacrum with necrosis of muscle ([place])    Urinary retention    Type 2 diabetes mellitus without complication, with long-term current use of insulin ([facility])    Tachycardia    Lower extremity edema    Acute metabolic encephalopathy    Obstructive sleep apnea    Morbid obesity with BMI of 60.0-69.9, adult ([place])    Goals of care, counseling/discussion    Palliative care encounter    Sepsis ([place]) 04/27/2019   H/O insulin dependent diabetes mellitus 04/27/2019   History of CVA with residual deficit 04/27/2019   Seizure disorder ([place]) 04/27/2019   Decubitus ulcer of sacral region, stage 4 ([place]) 04/27/2019    Lewis Moccasin, PT 05/03/2021, 4:30 PM  [place] MAIN [facility] SERVICES [address] [place], [place], [address] Phone: [phone]   Fax:  [phone]  Name: Beckam Abdulaziz MRN: [id number] Date of Birth: [birth year]

## 2021-05-03 NOTE — Therapy (Addendum)
[place] MAIN [facility] SERVICES [address] [place], [place], [address] Phone: [phone]   Fax:  [phone]  Occupational Therapy Treatment  Patient Details  Name: Paul Hall MRN: [id number] Date of Birth: Jun 29, 1995 Referring Provider (OT): Collene Mares   Encounter Date: 05/03/2021   OT End of Session - 05/03/21 1711     Visit Number 28    Number of Visits 52    Date for OT Re-Evaluation 06/15/21    Authorization Type Progress report period starting 03/17/2021    OT Start Time 1607    OT Stop Time 1650    OT Time Calculation (min) 43 min    Activity Tolerance Patient tolerated treatment well    Behavior During Therapy [facility] for tasks assessed/performed             Past Medical History:  Diagnosis Date   Diabetes mellitus ([place])    Hypertension    Stroke [facility])     Past Surgical History:  Procedure Laterality Date   IVC FILTER PLACEMENT ([facility] HX)     LEG SURGERY     PEG TUBE PLACEMENT     TRACHEOSTOMY      There were no vitals filed for this visit.   Subjective Assessment - 05/03/21 1709     Subjective  Pt. reports that he had an evaluation for Botox injections last Friday, however will have his first injection on feb. 17th.    Patient is accompanied by: Family member    Pertinent History Pt. is a 26 y.o. male who was hospitalized, and diagnosed with COVID-19, sustained a CVA with resultant quadreplegia in 01/2019. Pt. was hospitalized with VRE  UTI. Pt. PMHx includes: urinary retention, Seizure Disorder, Obstructive Sleep Disorder, DM Type II, and Morbid obesity. Pt. resides at home with his parents.    Currently in Pain? No/denies            OT TREATMENT     Estim attended:   Pt. tolerated neuromuscular estim  for 5 min., cycle time 10/10,  duty cycle 50%. Applied to the right wrist, and digit extensors, followed by  8 min. Applied to the left wrist, and digit extensors with the intensity set to 44,  and constant monitoring 100% of the time. Manual assist was required for wrist and digit extension with each rep. To hold the position.    Therapeutic Exercise:   Pt. tolerated bilateral wrist flexion, extension, and MP, PIP, and DIP flexion, and extension. Patient tolerated moist heat modality for 5 min. to his bilateral hands prior to range of motion.    Pt. reports that he had an appointment last week to be evaluated for Botox. Pt. Will have his first injections in a few weeks. Pt. tolerated ROM well to the bilateral UEs. No pain reported today. Pt. maintains his bilateral wrists, and digit MPs, PIPs, and DIPs in flexion at rest. Pt. continues to tolerate Estim well to the right, and left wrist, and digit extensors with manual assist. Pt. tolerated UE strengthening with wrist weights. Patient continues to work on normalizing tone in the bilateral upper extremities in order to improve range of motion, and facilitate active range of motion in order to increase engagement in ADLs and IADL tasks.                              OT Education - 05/03/21 1711     Education Details  BUE AROM    Person(s) Educated Patient    Methods Verbal cues;Tactile cues    Comprehension Verbalized understanding;Verbal cues required;Need further instruction;Returned demonstration                 OT Long Term Goals - 03/22/21 1437       OT LONG TERM GOAL #1   Title Pt. will improve FOTO score by 5 points for clinically significant improvements during ADLs, and IADLs,    Baseline 21st: FOTO 43. 10th Visit: FOTO score 42 Eval: FOTO score: 41    Time 12    Period Weeks    Status Revised    Target Date 06/14/21      OT LONG TERM GOAL #2   Title Pt. will improve right shoulder flexion by 10 degrees to assist caregivers with UE dressing    Baseline Pt. continues to require assist with UE dressing secondary to limited ROM.    Time 12    Period Weeks    Status Achieved    Target Date  03/24/21      OT LONG TERM GOAL #3   Title Pt. will improve right Abduction by 10 degrees to assist caregivers with underarm hygiene care.    Baseline Eval: AROM abduction RUE 76. Pt. continues to present with limited bilateral abduction. 21st: AROM abduction RUE 80.    Time 12    Period Weeks    Status On-going    Target Date 06/14/21      OT LONG TERM GOAL #4   Title Pt. will demonstrate visual compensatory strategies for navigating through his environment.    Baseline 20t: pt continues to require assist to navigate environment, minimal knowledge of strategies. Eval: Limited    Time 12    Period Weeks    Status On-going    Target Date 06/14/21      OT LONG TERM GOAL #5   Title Pt. will demonstrate visual compensatory strategies for tabletop tasks within his near space.    Baseline Pt. continues to present with limited vision. 21st: Pt can identify objects on tabletop ~waterbottle sized, improves with contrasting colours, cannot ID small onjects    Time 12    Period Weeks    Status On-going    Target Date 06/14/21      Long Term Additional Goals   Additional Long Term Goals Yes      OT LONG TERM GOAL #6   Title Pt. will perform hand to mouth patterns with min A in preparation for self-feeding.    Baseline Pt. is able to perfrom hand to mouth patterns, however reports weakness in his hand  limits a secure grasp on the utensi Pt. requires assist to perform. Eval: Pt. has difficulty    Time 12    Period Weeks    Status Achieved    Target Date 03/24/21      OT LONG TERM GOAL #7   Title Pt. will perform one step self-grooming tasks with minA.    Baseline Pt. has difficulty sustaining the UEs in elevation for the duration required to perfrom self-grooming tasks thoroughly.Eval: pt. has difficulty    Time 12    Status Achieved    Target Date 03/24/21      OT LONG TERM GOAL #8   Title Pt will complete UB dressing tasks with SETUP and assist to thread over hands only    Baseline  21st: Pt requires MIN A    Time 12  Period Weeks    Status New    Target Date 06/14/21      OT LONG TERM GOAL  #9   TITLE Pt will improve B grip strength by 5# to assist in handling ADL items    Baseline 21st: R grip 3#, L grip 2#    Time 12    Period Weeks    Status New    Target Date 06/14/21      OT LONG TERM GOAL  #10   TITLE Pt will improve B lateral pinch strength by 3# to assist in UBD.    Baseline 21st: R lateral pinch 4#, L lateral pinch 3#    Time 12    Period Weeks    Status New    Target Date 06/14/21                   Plan - 05/03/21 1712     Clinical Impression Statement Pt. reports that he had an appointment last week to be evaluated for Botox. Pt. Will have his first injections in a few weeks. Pt. tolerated ROM well to the bilateral UEs. No pain reported today. Pt. maintains his bilateral wrists, and digit MPs, PIPs, and DIPs in flexion at rest. Pt. continues to tolerate Estim well to the right, and left wrist, and digit extensors with manual assist. Pt. tolerated UE strengthening with wrist weights. Patient continues to work on normalizing tone in the bilateral upper extremities in order to improve range of motion, and facilitate active range of motion in order to increase engagement in ADLs and IADL tasks.     OT Occupational Profile and History Detailed Assessment- Review of Records and additional review of physical, cognitive, psychosocial history related to current functional performance    Occupational performance deficits (Please refer to evaluation for details): ADL's;IADL's    Body Structure / Function / Physical Skills ADL;FMC;ROM;Dexterity;IADL;Endurance;Obesity;Strength;UE functional use    Rehab Potential Good    Clinical Decision Making Multiple treatment options, significant modification of task necessary    Comorbidities Affecting Occupational Performance: Presence of comorbidities impacting occupational performance    Modification or  Assistance to Complete Evaluation  Max significant modification of tasks or assist is necessary to complete    OT Frequency 2x / week    OT Duration 12 weeks    OT Treatment/Interventions Self-care/ADL training;Therapeutic exercise;Neuromuscular education;Visual/perceptual remediation/compensation;Patient/family education;Therapeutic activities;DME and/or AE instruction;Passive range of motion;Moist Heat;Electrical Stimulation    Consulted and Agree with Plan of Care Patient             Patient will benefit from skilled therapeutic intervention in order to improve the following deficits and impairments:   Body Structure / Function / Physical Skills: ADL, FMC, ROM, Dexterity, IADL, Endurance, Obesity, Strength, UE functional use       Visit Diagnosis: Muscle weakness (generalized)    Problem List Patient Active Problem List   Diagnosis Date Noted   Sepsis due to vancomycin resistant Enterococcus species ([place]) 06/06/2019   SIRS (systemic inflammatory response syndrome) ([place]) 06/05/2019   Acute lower UTI 06/05/2019   VRE (vancomycin-resistant Enterococci) infection 06/05/2019   Anemia 06/05/2019   Skin ulcer of sacrum with necrosis of muscle (HCC)    Urinary retention    Type 2 diabetes mellitus without complication, with long-term current use of insulin (HCC)    Tachycardia    Lower extremity edema    Acute metabolic encephalopathy    Obstructive sleep apnea    Morbid obesity with BMI of  60.0-69.9, adult [facility])    Goals of care, counseling/discussion    Palliative care encounter    Sepsis ([place]) 04/27/2019   H/O insulin dependent diabetes mellitus 04/27/2019   History of CVA with residual deficit 04/27/2019   Seizure disorder ([place]) 04/27/2019   Decubitus ulcer of sacral region, stage 4 ([place]) 04/27/2019   Harrel Carina, MS, OTR/L   Harrel Carina, OT 05/03/2021, 5:14 PM  [place] MAIN [facility] SERVICES [address]  [place], [place], [address] Phone: [phone]   Fax:  [phone]  Name: Paul Hall MRN: [id number] Date of Birth: January 29, 1996

## 2021-05-05 ENCOUNTER — Ambulatory Visit: Payer: BC Managed Care – PPO

## 2021-05-05 ENCOUNTER — Encounter: Payer: Self-pay | Admitting: Occupational Therapy

## 2021-05-05 ENCOUNTER — Encounter: Payer: BC Managed Care – PPO | Admitting: Speech Pathology

## 2021-05-05 ENCOUNTER — Ambulatory Visit: Payer: BC Managed Care – PPO | Attending: Physician Assistant | Admitting: Occupational Therapy

## 2021-05-05 ENCOUNTER — Other Ambulatory Visit: Payer: Self-pay

## 2021-05-05 DIAGNOSIS — M6281 Muscle weakness (generalized): Secondary | ICD-10-CM | POA: Insufficient documentation

## 2021-05-05 DIAGNOSIS — R278 Other lack of coordination: Secondary | ICD-10-CM | POA: Insufficient documentation

## 2021-05-05 DIAGNOSIS — R269 Unspecified abnormalities of gait and mobility: Secondary | ICD-10-CM

## 2021-05-05 DIAGNOSIS — R2681 Unsteadiness on feet: Secondary | ICD-10-CM | POA: Insufficient documentation

## 2021-05-05 DIAGNOSIS — R262 Difficulty in walking, not elsewhere classified: Secondary | ICD-10-CM | POA: Insufficient documentation

## 2021-05-05 DIAGNOSIS — I6930 Unspecified sequelae of cerebral infarction: Secondary | ICD-10-CM

## 2021-05-05 NOTE — Therapy (Signed)
[place] MAIN [facility] SERVICES [address] [place], [place], [address] Phone: [phone]   Fax:  [phone]  Physical Therapy Treatment  Patient Details  Name: Paul Hall MRN: [id number] Date of Birth: 01/04/1996 Referring Provider (PT): Collene Mares, PA-C   Encounter Date: 05/05/2021   PT End of Session - 05/05/21 1617     Visit Number 19    Number of Visits 25    Date for PT Re-Evaluation 06/16/21    Authorization Type [employer] and [employer]- Need auth past 12th visit    Authorization Time Period 01/04/21-03/29/21; Recert 04/12/3233-5/73/2202    Progress Note Due on Visit 20    PT Start Time 1522    PT Stop Time 1601    PT Time Calculation (min) 39 min    Equipment Utilized During Treatment Other (comment)   Hoyer lift   Activity Tolerance Patient tolerated treatment well   queasiness/nausea   Behavior During Therapy WFL for tasks assessed/performed             Past Medical History:  Diagnosis Date   Diabetes mellitus ([place])    Hypertension    Stroke [facility])     Past Surgical History:  Procedure Laterality Date   IVC FILTER PLACEMENT ([facility] HX)     LEG SURGERY     PEG TUBE PLACEMENT     TRACHEOSTOMY      There were no vitals filed for this visit.   Subjective Assessment - 05/05/21 1615     Subjective Patient states his mother noticed a bruise on the top of his left foot - denies any pain or soreness from last session.    Patient is accompained by: Family member    Pertinent History Paul Hall is a 57yoM who presents with severe weakness, quadriparesis, altered sensorium, and visual impairment s/p critical illness and prolonged hospitalization. Pt hospitalized in October 2020 with ARDS 2/2 COVID19 infection. Pt sustained a complex and lengthy hospitalization which included tracheostomy, prolonged sedation, ECMO. In this period pt sustained CVA and SDH. Pt has now been liberated from tracheostomy and  G-tube. Pt has since been hospitalized for wound infection and UTI. Pt lives with parents at home, has hospital bed and left chair, hoyer lift transfers, and power WC for mobility needs. Pt needs heavy physical assistance with ADL 2/2 BUE contractures and motor dysfunction.    Limitations Lifting;Standing;Walking;Writing;Reading;Sitting;House hold activities    How long can you sit comfortably? Patient reports uable to sit up long < 4 hours due to buttock discomfort    How long can you stand comfortably? Patient is currently unable to stand    How long can you walk comfortably? Patient is currently unable to walk    Diagnostic tests No recent relavent diagnostic tests    Patient Stated Goals I want to be able to possibly walk again    Currently in Pain? No/denies              Therapeutic Exercises:   Scap row using RTB- 3 sets of 12 reps Abd crunch- VC to lean forward (initial set min physical assist) 3 sets total of 12 reps.  Horizontal sh Abd using RTB- Assist on right arm (instructions to pretend he is reaching out to give a big hug then bring arms together)  3 sets of 12 reps.  Seated shoulder depression- attempting chair press up- Able to push both forearms down onto arm rests - Unable to lift Bottom off chair. 2  sets of 12 reps.   Seated Knee ext with slow eccentric control- AROM 2 sets of 12 reps Seated manual resistive hip adduction hold 5 sec x 12 reps  Education provided throughout session via VC/TC and demonstration to facilitate movement at target joints and correct muscle activation for all testing and exercises performed.                            PT Education - 05/05/21 1617     Education Details Exercise technique    Person(s) Educated Patient    Methods Explanation;Demonstration;Tactile cues;Verbal cues    Comprehension Verbalized understanding;Returned demonstration;Verbal cues required;Tactile cues required;Need further instruction               PT Short Term Goals - 03/15/21 1724       PT SHORT TERM GOAL #1   Title Pt will be independent with HEP in order to improve strength and balance in order to decrease fall risk and improve function at home and work.    Baseline 01/04/2021= No formal HEP in place; 12/12 no HEP in place    Time 6    Period Weeks    Status On-going    Target Date 02/15/21               PT Long Term Goals - 03/15/21 1725       PT LONG TERM GOAL #1   Title Patient will increase BLE gross strength by 1/2 muscle grade to improve functional strength for improved independence with potential gait, increased standing tolerance and increased ADL ability.    Baseline 01/04/2021- Patient presents with 1/5 to 3-/5 B LE strength with MMT; 12/12: goal partially met for Left knee/hip    Time 12    Period Weeks    Status On-going    Target Date 03/29/21      PT LONG TERM GOAL #2   Title Patient will tolerate sitting unsupported demonstrating erect sitting posture for 15 minutes with CGA to demonstrate improved back extensor strength and improved sitting tolerance.    Baseline 01/04/2021- Patient confied to sitting in lift chair or electric power chair with back support and unable to sit upright without physical assistance; 12/12: tolerates <1 minutes upright unsupported sitting.    Time 12    Period Weeks    Status On-going    Target Date 03/29/21      PT LONG TERM GOAL #3   Title Patient will demonstrate ability to perform static standing in // bars > 2 min with Max Assist  without loss of balance and fair posture for improved overall strength for pre-gait and transfer activities.    Baseline 01/04/2021= Patient current uanble to stand- Dependent on hoyer or sit to stand lift for transfers.    Time 12    Period Weeks    Status On-going    Target Date 03/29/21      PT LONG TERM GOAL #4   Title Pt will improve FOTO score by 10 points or more demonstrating improved perceived functional ability     Baseline FOTO 7 on 10/17; 03/15/21: FOTO 12    Time 12    Period Weeks    Status On-going    Target Date 03/29/21                   Plan - 05/05/21 1614     Clinical Impression Statement Patient arrived today with good motivation.  Treatment focused on more muscle endurance today and patient performed well building up to 3 sets of 12 reps with some UE/Postural/abd exercises and able to exhibit improved right knee strength going through full ROM and some eccentric control today. Pt will continue to benefit from skilled PT intervention to improve his overall function, weight shifting and postiion change ability, LE strength, trunk strength and improve his overall quality of life.    Personal Factors and Comorbidities Comorbidity 3+;Time since onset of injury/illness/exacerbation    Comorbidities CVA, diabetes, Seizures    Examination-Activity Limitations Bathing;Bed Mobility;Bend;Caring for Others;Carry;Dressing;Hygiene/Grooming;Lift;Locomotion Level;Reach Overhead;Self Feeding;Sit;Squat;Stairs;Stand;Transfers;Toileting    Examination-Participation Restrictions Cleaning;Community Activity;Driving;Laundry;Medication Management;Meal Prep;Occupation;Personal Finances;Shop;Yard Work;Volunteer    Stability/Clinical Decision Making Evolving/Moderate complexity    Rehab Potential Fair    PT Frequency 2x / week    PT Duration 12 weeks    PT Treatment/Interventions ADLs/Self Care Home Management;Cryotherapy;Electrical Stimulation;Moist Heat;Ultrasound;DME Instruction;Gait training;Stair training;Functional mobility training;Therapeutic exercise;Balance training;Patient/family education;Orthotic Fit/Training;Neuromuscular re-education;Wheelchair mobility training;Manual techniques;Passive range of motion;Dry needling;Energy conservation;Taping;Visual/perceptual remediation/compensation;Joint Manipulations    PT Next Visit Plan core strength, postural control, sabina sit to stand if able; Continue  with progressive LE Strengthening    PT Home Exercise Plan No changes to HEP today    Consulted and Agree with Plan of Care Patient;Family member/caregiver    Family Member Consulted Dad             Patient will benefit from skilled therapeutic intervention in order to improve the following deficits and impairments:  Abnormal gait, Decreased activity tolerance, Decreased balance, Decreased cognition, Cardiopulmonary status limiting activity, Decreased coordination, Decreased endurance, Decreased knowledge of use of DME, Decreased mobility, Decreased range of motion, Decreased safety awareness, Decreased strength, Difficulty walking, Hypomobility, Impaired perceived functional ability, Impaired flexibility, Impaired UE functional use, Impaired vision/preception, Improper body mechanics, Postural dysfunction, Obesity  Visit Diagnosis: Muscle weakness (generalized)  Abnormality of gait and mobility  Difficulty in walking, not elsewhere classified  History of CVA with residual deficit     Problem List Patient Active Problem List   Diagnosis Date Noted   Sepsis due to vancomycin resistant Enterococcus species ([place]) 06/06/2019   SIRS (systemic inflammatory response syndrome) ([place]) 06/05/2019   Acute lower UTI 06/05/2019   VRE (vancomycin-resistant Enterococci) infection 06/05/2019   Anemia 06/05/2019   Skin ulcer of sacrum with necrosis of muscle ([place])    Urinary retention    Type 2 diabetes mellitus without complication, with long-term current use of insulin (HCC)    Tachycardia    Lower extremity edema    Acute metabolic encephalopathy    Obstructive sleep apnea    Morbid obesity with BMI of 60.0-69.9, adult ([place])    Goals of care, counseling/discussion    Palliative care encounter    Sepsis ([place]) 04/27/2019   H/O insulin dependent diabetes mellitus 04/27/2019   History of CVA with residual deficit 04/27/2019   Seizure disorder ([place]) 04/27/2019   Decubitus ulcer of sacral  region, stage 4 ([place]) 04/27/2019    Paul Hall, PT 05/05/2021, 4:19 PM  [place] MAIN [facility] SERVICES [address] [place], [place], [address] Phone: [phone]   Fax:  [phone]  Name: Paul Hall MRN: [id number] Date of Birth: 20-Aug-1995

## 2021-05-05 NOTE — Therapy (Signed)
[facility] MAIN [facility] SERVICES [address] [place], [place], [address] Phone: [phone]   Fax:  [phone]  Occupational Therapy Treatment  Patient Details  Name: Paul Hall MRN: [id number] Date of Birth: 27-Jan-1996 Referring Provider (OT): Lenise Herald   Encounter Date: 05/05/2021   OT End of Session - 05/05/21 1735     Visit Number 29    Number of Visits 72    Date for OT Re-Evaluation 06/15/21    Authorization Type Progress report period starting 03/17/2021    OT Start Time 1605    OT Stop Time 1650    OT Time Calculation (min) 45 min    Activity Tolerance Patient tolerated treatment well    Behavior During Therapy [facility] for tasks assessed/performed             Past Medical History:  Diagnosis Date   Diabetes mellitus (HCC)    Hypertension    Stroke [facility])     Past Surgical History:  Procedure Laterality Date   IVC FILTER PLACEMENT ([facility] HX)     LEG SURGERY     PEG TUBE PLACEMENT     TRACHEOSTOMY      There were no vitals filed for this visit.   Subjective Assessment - 05/05/21 1732     Subjective  pt. reports that he has taken a break from wearing his hand brace    Patient is accompanied by: Family member    Pertinent History Pt. is a 26 y.o. male who was hospitalized, and diagnosed with COVID-19, sustained a CVA with resultant quadreplegia in 01/2019. Pt. was hospitalized with VRE  UTI. Pt. PMHx includes: urinary retention, Seizure Disorder, Obstructive Sleep Disorder, DM Type II, and Morbid obesity. Pt. resides at home with his parents.    Currently in Pain? Yes    Pain Score --   Not rated   Pain Location Finger (Comment which one)    Pain Orientation Right    Pain Descriptors / Indicators Discomfort    Aggravating Factors  Initial digit extension            OT TREATMENT     Estim attended:   Pt. tolerated neuromuscular estim  for 5 min., cycle time 10/10,  duty cycle 50%. Applied to the  right wrist, and digit extensors, followed by  8 min. Applied to the left wrist, and digit extensors with the intensity set to 44, and constant monitoring 100% of the time. Manual assist was required for wrist and digit extension with each rep. To hold the position.    Therapeutic Exercise:   Pt. tolerated bilateral wrist flexion, extension, and MP, PIP, and DIP flexion, and extension. Patient tolerated moist heat modality for 5 min. to his bilateral hands prior to range of motion.    Pt. reports discomfort initially with gentle ROM extension of the right MPs, and PIPs as pt. keeps the digits held in flexion at rest. Pt. reports that he has stopped wearing his hand brace several times this week secondary to discomfort. Pt. Was encouraged to bring the splint/brace in to assess current fit. Pt. tolerated ROM well to the bilateral UEs. No pain reported today. Pt. maintains his bilateral wrists, and digit MPs, PIPs, and DIPs in flexion at rest. Pt. continues to tolerate Estim well to the right, and left wrist, and digit extensors with manual assist. Pt. tolerated UE strengthening with wrist weights. Patient continues to work on normalizing tone in the bilateral upper extremities in  order to improve range of motion, and facilitate active range of motion in order to increase engagement in ADLs and IADL tasks.                                    OT Education - 05/05/21 1735     Education Details BUE AROM    Person(s) Educated Patient    Methods Verbal cues;Tactile cues    Comprehension Verbalized understanding;Verbal cues required;Need further instruction;Returned demonstration                 OT Long Term Goals - 03/22/21 1437       OT LONG TERM GOAL #1   Title Pt. will improve FOTO score by 5 points for clinically significant improvements during ADLs, and IADLs,    Baseline 21st: FOTO 43. 10th Visit: FOTO score 42 Eval: FOTO score: 41    Time 12    Period Weeks     Status Revised    Target Date 06/14/21      OT LONG TERM GOAL #2   Title Pt. will improve right shoulder flexion by 10 degrees to assist caregivers with UE dressing    Baseline Pt. continues to require assist with UE dressing secondary to limited ROM.    Time 12    Period Weeks    Status Achieved    Target Date 03/24/21      OT LONG TERM GOAL #3   Title Pt. will improve right Abduction by 10 degrees to assist caregivers with underarm hygiene care.    Baseline Eval: AROM abduction RUE 76. Pt. continues to present with limited bilateral abduction. 21st: AROM abduction RUE 80.    Time 12    Period Weeks    Status On-going    Target Date 06/14/21      OT LONG TERM GOAL #4   Title Pt. will demonstrate visual compensatory strategies for navigating through his environment.    Baseline 20t: pt continues to require assist to navigate environment, minimal knowledge of strategies. Eval: Limited    Time 12    Period Weeks    Status On-going    Target Date 06/14/21      OT LONG TERM GOAL #5   Title Pt. will demonstrate visual compensatory strategies for tabletop tasks within his near space.    Baseline Pt. continues to present with limited vision. 21st: Pt can identify objects on tabletop ~waterbottle sized, improves with contrasting colours, cannot ID small onjects    Time 12    Period Weeks    Status On-going    Target Date 06/14/21      Long Term Additional Goals   Additional Long Term Goals Yes      OT LONG TERM GOAL #6   Title Pt. will perform hand to mouth patterns with min A in preparation for self-feeding.    Baseline Pt. is able to perfrom hand to mouth patterns, however reports weakness in his hand  limits a secure grasp on the utensi Pt. requires assist to perform. Eval: Pt. has difficulty    Time 12    Period Weeks    Status Achieved    Target Date 03/24/21      OT LONG TERM GOAL #7   Title Pt. will perform one step self-grooming tasks with minA.    Baseline Pt. has  difficulty sustaining the UEs in elevation for the duration required to  perfrom self-grooming tasks thoroughly.Eval: pt. has difficulty    Time 12    Status Achieved    Target Date 03/24/21      OT LONG TERM GOAL #8   Title Pt will complete UB dressing tasks with SETUP and assist to thread over hands only    Baseline 21st: Pt requires MIN A    Time 12    Period Weeks    Status New    Target Date 06/14/21      OT LONG TERM GOAL  #9   TITLE Pt will improve B grip strength by 5# to assist in handling ADL items    Baseline 21st: R grip 3#, L grip 2#    Time 12    Period Weeks    Status New    Target Date 06/14/21      OT LONG TERM GOAL  #10   TITLE Pt will improve B lateral pinch strength by 3# to assist in UBD.    Baseline 21st: R lateral pinch 4#, L lateral pinch 3#    Time 12    Period Weeks    Status New    Target Date 06/14/21                   Plan - 05/05/21 1735     Clinical Impression Statement Pt. reports discomfort initially with gentle ROM extension of the right MPs, and PIPs as pt. keeps the digits held in flexion at rest. Pt. reports that he has stopped wearing his hand brace several times this week secondary to discomfort. Pt. Was encouraged to bring the splint/brace in to assess current fit. Pt. tolerated ROM well to the bilateral UEs. No pain reported today. Pt. maintains his bilateral wrists, and digit MPs, PIPs, and DIPs in flexion at rest. Pt. continues to tolerate Estim well to the right, and left wrist, and digit extensors with manual assist. Pt. tolerated UE strengthening with wrist weights. Patient continues to work on normalizing tone in the bilateral upper extremities in order to improve range of motion, and facilitate active range of motion in order to increase engagement in ADLs and IADL tasks.     OT Occupational Profile and History Detailed Assessment- Review of Records and additional review of physical, cognitive, psychosocial history related to  current functional performance    Occupational performance deficits (Please refer to evaluation for details): ADL's;IADL's    Body Structure / Function / Physical Skills ADL;FMC;ROM;Dexterity;IADL;Endurance;Obesity;Strength;UE functional use    Rehab Potential Good    Clinical Decision Making Multiple treatment options, significant modification of task necessary    Comorbidities Affecting Occupational Performance: Presence of comorbidities impacting occupational performance    Modification or Assistance to Complete Evaluation  Max significant modification of tasks or assist is necessary to complete    OT Frequency 2x / week    OT Duration 12 weeks    OT Treatment/Interventions Self-care/ADL training;Therapeutic exercise;Neuromuscular education;Visual/perceptual remediation/compensation;Patient/family education;Therapeutic activities;DME and/or AE instruction;Passive range of motion;Moist Heat;Electrical Stimulation    Consulted and Agree with Plan of Care Patient             Patient will benefit from skilled therapeutic intervention in order to improve the following deficits and impairments:   Body Structure / Function / Physical Skills: ADL, FMC, ROM, Dexterity, IADL, Endurance, Obesity, Strength, UE functional use       Visit Diagnosis: Muscle weakness (generalized)    Problem List Patient Active Problem List   Diagnosis Date Noted  Sepsis due to vancomycin resistant Enterococcus species (HCC) 06/06/2019   SIRS (systemic inflammatory response syndrome) (HCC) 06/05/2019   Acute lower UTI 06/05/2019   VRE (vancomycin-resistant Enterococci) infection 06/05/2019   Anemia 06/05/2019   Skin ulcer of sacrum with necrosis of muscle (HCC)    Urinary retention    Type 2 diabetes mellitus without complication, with long-term current use of insulin (HCC)    Tachycardia    Lower extremity edema    Acute metabolic encephalopathy    Obstructive sleep apnea    Morbid obesity with BMI  of 60.0-69.9, adult (HCC)    Goals of care, counseling/discussion    Palliative care encounter    Sepsis (HCC) 04/27/2019   H/O insulin dependent diabetes mellitus 04/27/2019   History of CVA with residual deficit 04/27/2019   Seizure disorder (HCC) 04/27/2019   Decubitus ulcer of sacral region, stage 4 (HCC) 04/27/2019   Olegario MessierElaine Yuma Pacella, MS, OTR/L   Olegario MessierElaine Sherrice Creekmore, OT 05/05/2021, 5:37 PM  [facility] MAIN [facility] [address] , [place], [id number] Phone: [phone]   Fax:  [phone]  Name: Paul Hall MRN: [id number] Date of Birth: Oct 21, 1995

## 2021-05-10 ENCOUNTER — Other Ambulatory Visit: Payer: Self-pay

## 2021-05-10 ENCOUNTER — Encounter: Payer: BC Managed Care – PPO | Admitting: Speech Pathology

## 2021-05-10 ENCOUNTER — Ambulatory Visit: Payer: BC Managed Care – PPO

## 2021-05-10 ENCOUNTER — Ambulatory Visit: Payer: BC Managed Care – PPO | Admitting: Occupational Therapy

## 2021-05-10 DIAGNOSIS — I6930 Unspecified sequelae of cerebral infarction: Secondary | ICD-10-CM | POA: Diagnosis not present

## 2021-05-10 DIAGNOSIS — R278 Other lack of coordination: Secondary | ICD-10-CM | POA: Diagnosis not present

## 2021-05-10 DIAGNOSIS — R2681 Unsteadiness on feet: Secondary | ICD-10-CM

## 2021-05-10 DIAGNOSIS — R262 Difficulty in walking, not elsewhere classified: Secondary | ICD-10-CM

## 2021-05-10 DIAGNOSIS — M6281 Muscle weakness (generalized): Secondary | ICD-10-CM

## 2021-05-10 DIAGNOSIS — R269 Unspecified abnormalities of gait and mobility: Secondary | ICD-10-CM | POA: Diagnosis not present

## 2021-05-10 NOTE — Therapy (Signed)
[facility] MAIN [facility] SERVICES [address] [place], [place], [address] Phone: [phone]   Fax:  [phone]  Occupational Therapy Progress Note  Dates of reporting period  03/17/2022   to   05/10/2021   Patient Details  Name: Paul Hall MRN: [id number] Date of Birth: 29-Dec-1995 Referring Provider (OT): Lenise Herald   Encounter Date: 05/10/2021   OT End of Session - 05/10/21 1710     Visit Number 30    Number of Visits 72    Date for OT Re-Evaluation 06/14/21    Authorization Type Progress report period starting 03/17/2021    OT Start Time 1600    OT Stop Time 1655    OT Time Calculation (min) 55 min    Activity Tolerance Patient tolerated treatment well    Behavior During Therapy WFL for tasks assessed/performed             Past Medical History:  Diagnosis Date   Diabetes mellitus (HCC)    Hypertension    Stroke [facility])     Past Surgical History:  Procedure Laterality Date   IVC FILTER PLACEMENT ([facility] HX)     LEG SURGERY     PEG TUBE PLACEMENT     TRACHEOSTOMY      There were no vitals filed for this visit.          [facility] OT Assessment - 05/10/21 1716       AROM   Overall AROM Comments RUE shoulder flexion attempts move into  108 degrees of abduction, elbow flexion 127(140), elbow extension -38(-67), wrist extension -6(42), wrist flexion: 58(80). LUE: shoulder 124(124), abduction: 115(115), elbow flexion: 140(140), elbow extension: 16(34), wrist extension: 24(54) wrist flexion: 26(60)      Hand Function   Right Hand Grip (lbs) 3    Left Hand Grip (lbs) 0    Comment Bilateral digits present with active MP hyperextension, with digit PIP, and DIP flexion             Measurements were obtained, and goals were reviewed with the pt. Pt. Is making progress, and is now able to use his UEs to engage more with functional tasks. Pt. is able to engage in self-feeding, adjust his glasses, and his towel, and  assist with threading his sleeve during UE dressing. Pt. has improved with LUE ROM. Pt.'s FOTO score is 37. Pt. Plans to start Botox injections this month. Pt. Brought in his current splint to assess fit. Splint base was manually modified for a better fit, however pt. Could benefit from a SoftPro resting hand splint. Pt. Reported not feeling great today, and was tearful during the session when reviewing his progress. Therapeutic listening, and gentle encouragement was provided. Pt. Continues to benefit from OT services to work on improving BUE functioning, and  improving engagement in, and maximizing overall independence with ADLs, and IADLs.                      OT Long Term Goals - 05/10/21 1639       OT LONG TERM GOAL #1   Title Pt. will improve FOTO score by 5 points for clinically significant improvements during ADLs, and IADLs,    Baseline 30th visit: FOTO: 37, 21st: FOTO 43. 10th Visit: FOTO score 42 Eval: FOTO score: 41    Time 12    Period Weeks    Status Revised    Target Date 06/14/21      OT  LONG TERM GOAL #4   Title Pt. will demonstrate visual compensatory strategies for navigating through his environment.    Baseline 30th visit: Pt. continues to require assist to navigate through his environment. 20t: pt continues to require assist to navigate environment, minimal knowledge of strategies. Eval: Limited    Time 12    Period Weeks    Status On-going    Target Date 06/14/21      OT LONG TERM GOAL #5   Title Pt. will demonstrate visual compensatory strategies for tabletop tasks within his near space.    Baseline 30th visit: Pt. coninues to present with visual limtiations for tabletop tasks in his near space. Pt. continues to present with limited vision. 21st: Pt can identify objects on tabletop ~waterbottle sized, improves with contrasting colours, cannot ID small onjects    Time 12    Period Weeks    Status On-going    Target Date 06/14/21      OT LONG TERM  GOAL #6   Title Pt. will perform hand to mouth patterns with supervision in preparation for self-feeding.    Baseline 30th visit: Pt. continues to present with weakness limiting functional hand to mouth patterns of movement. Pt. is able to perfrom hand to mouth patterns, however reports weakness in his hand  limits a secure grasp on the utensi Pt. requires assist to perform. Eval: Pt. has difficulty    Time 12    Period Weeks    Status Revised    Target Date 06/14/21      OT LONG TERM GOAL #7   Title Pt. will perform one step self-grooming tasks with minA.    Baseline 3oth visit: Pt. continues to have difficulty sustaining the UEs in elevation for the duration required to perfrom self-grooming tasks thoroughly.Eval: pt. has difficulty    Time 12    Period Weeks    Status Revised    Target Date 06/14/21      OT LONG TERM GOAL #8   Title Pt will complete UB dressing tasks with SETUP and assist to thread over hands only    Baseline 30th visit: Min-ModA threading arms, MaxAx's 2 ot bring the jacket over,a nd around his back. .21st: Pt requires MIN A    Time 12    Period Weeks    Status On-going    Target Date 06/14/21      OT LONG TERM GOAL  #9   TITLE Pt will improve B grip strength by 5# to assist in handling ADL items    Baseline 30th: R: 3#, L: 0# 21st: R grip 3#, L grip 2#    Time 12    Period Weeks    Status On-going    Target Date 06/14/21      OT LONG TERM GOAL  #10   TITLE Pt will improve B lateral pinch strength by 3# to assist in UBD.    Baseline 30th visit: TBD 21st: R lateral pinch 4#, L lateral pinch 3#    Time 12    Period Weeks    Status On-going    Target Date 06/14/21                   Plan - 05/10/21 1712     Clinical Impression Statement Measurements were obtained, and goals were reviewed with the pt. Pt. Is making progress, and is now able to use his UEs to engage more with functional tasks. Pt. is able to engage in self-feeding, adjust  his  glasses, and his towel, and assist with threading his sleeve during UE dressing. Pt. has improved with LUE ROM. Pt.'s FOTO score is 37. Pt. Plans to start Botox injections this month. Pt. Brought in his current splint to assess fit. Splint base was manually modified for a better fit, however pt. Could benefit from a SoftPro resting hand splint. Pt. Reported not feeling great today, and was tearful during the session when reviewing his progress. Therapeutic listening, and gentle encouragement was provided. Pt. Continues to benefit from OT services to work on improving BUE functioning, and  improving engagement in, and maximizing overall independence with ADLs, and IADLs.      OT Occupational Profile and History Detailed Assessment- Review of Records and additional review of physical, cognitive, psychosocial history related to current functional performance    Occupational performance deficits (Please refer to evaluation for details): ADL's;IADL's    Body Structure / Function / Physical Skills ADL;FMC;ROM;Dexterity;IADL;Endurance;Obesity;Strength;UE functional use    Rehab Potential Good    Clinical Decision Making Multiple treatment options, significant modification of task necessary    Comorbidities Affecting Occupational Performance: Presence of comorbidities impacting occupational performance    Modification or Assistance to Complete Evaluation  Max significant modification of tasks or assist is necessary to complete    OT Frequency 2x / week    OT Duration 12 weeks    OT Treatment/Interventions Self-care/ADL training;Therapeutic exercise;Neuromuscular education;Visual/perceptual remediation/compensation;Patient/family education;Therapeutic activities;DME and/or AE instruction;Passive range of motion;Moist Heat;Electrical Stimulation    Consulted and Agree with Plan of Care Patient             Patient will benefit from skilled therapeutic intervention in order to improve the following  deficits and impairments:   Body Structure / Function / Physical Skills: ADL, FMC, ROM, Dexterity, IADL, Endurance, Obesity, Strength, UE functional use       Visit Diagnosis: Muscle weakness (generalized)    Problem List Patient Active Problem List   Diagnosis Date Noted   Sepsis due to vancomycin resistant Enterococcus species (HCC) 06/06/2019   SIRS (systemic inflammatory response syndrome) (HCC) 06/05/2019   Acute lower UTI 06/05/2019   VRE (vancomycin-resistant Enterococci) infection 06/05/2019   Anemia 06/05/2019   Skin ulcer of sacrum with necrosis of muscle (HCC)    Urinary retention    Type 2 diabetes mellitus without complication, with long-term current use of insulin (HCC)    Tachycardia    Lower extremity edema    Acute metabolic encephalopathy    Obstructive sleep apnea    Morbid obesity with BMI of 60.0-69.9, adult (HCC)    Goals of care, counseling/discussion    Palliative care encounter    Sepsis (HCC) 04/27/2019   H/O insulin dependent diabetes mellitus 04/27/2019   History of CVA with residual deficit 04/27/2019   Seizure disorder (HCC) 04/27/2019   Decubitus ulcer of sacral region, stage 4 (HCC) 04/27/2019   Olegario Messier, MS, OTR/L   Olegario Messier, OT 05/10/2021, 5:35 PM  [facility] MAIN [facility] SERVICES [address] [place], [place], [address] Phone: [phone]   Fax:  [phone]  Name: Paul Hall MRN: [id number] Date of Birth: Aug 11, 1995

## 2021-05-10 NOTE — Therapy (Signed)
[place] MAIN [facility] SERVICES [address] [place], [place], [address] Phone: [phone]   Fax:  [phone]  Physical Therapy Treatment/Physical Therapy Progress Note   Dates of reporting period  03/15/2021  to   05/10/2021  Patient Details  Name: Paul Hall MRN: [id number] Date of Birth: December 08, 1995 Referring Provider (PT): Collene Mares, [place]   Encounter Date: 05/10/2021   PT End of Session - 05/10/21 1512     Visit Number 20    Number of Visits 25    Date for PT Re-Evaluation 06/16/21    Authorization Type [employer] and [employer] Medicaid- Need auth past 12th visit    Authorization Time Period 01/04/21-03/29/21; Recert 82/50/[id number]/[id number]/4193    Progress Note Due on Visit 20    PT Start Time 1516    PT Stop Time 1558    PT Time Calculation (min) 42 min    Equipment Utilized During Treatment Other (comment)   Hoyer lift   Activity Tolerance Patient tolerated treatment well   queasiness/nausea   Behavior During Therapy WFL for tasks assessed/performed             Past Medical History:  Diagnosis Date   Diabetes mellitus ([place])    Hypertension    Stroke [facility])     Past Surgical History:  Procedure Laterality Date   IVC FILTER PLACEMENT ([place] HX)     LEG SURGERY     PEG TUBE PLACEMENT     TRACHEOSTOMY      There were no vitals filed for this visit.   Subjective Assessment - 05/10/21 1511     Subjective Patient reports doing well and having a restful weekend.    Patient is accompained by: Family member    Pertinent History Paul Hall is a 29yoM who presents with severe weakness, quadriparesis, altered sensorium, and visual impairment s/p critical illness and prolonged hospitalization. Pt hospitalized in October 2020 with ARDS 2/2 COVID19 infection. Pt sustained a complex and lengthy hospitalization which included tracheostomy, prolonged sedation, ECMO. In this period pt sustained CVA and SDH. Pt has now been  liberated from tracheostomy and G-tube. Pt has since been hospitalized for wound infection and UTI. Pt lives with parents at home, has hospital bed and left chair, hoyer lift transfers, and power WC for mobility needs. Pt needs heavy physical assistance with ADL 2/2 BUE contractures and motor dysfunction.    Limitations Lifting;Standing;Walking;Writing;Reading;Sitting;House hold activities    How long can you sit comfortably? Patient reports uable to sit up long < 4 hours due to buttock discomfort    How long can you stand comfortably? Patient is currently unable to stand    How long can you walk comfortably? Patient is currently unable to walk    Diagnostic tests No recent relavent diagnostic tests    Patient Stated Goals I want to be able to possibly walk again    Currently in Pain? No/denies            INTERVENTIONS:     Reassessed all STG and LTGs today:   STG- Home program goal- Patient and his father were able to report compliance with curent HEP consisting of mostly seated/reclined LE strengthening. Both verbalize no questions at this time.  LTG's:   MMT= 2-/5 bilateal Hip flex; 3+/5 bilateral Knee ext (partially met)  Sitting tolerance= static sit with forward trunk lean  in his power wheelchair without back support x approx 3 min.   Static standing- Not appropriate yet- Currently still  dependent with all transfers using hoyer.     FOTO= 5   Therapeutic Exercises:   Thoracic trunk twist (holding onto pvc pipe) x 12 reps left then right (min assist to keep grip on pipe with left hand)  Chest press/pull with pvc pipe x 12 reps.   Seated Knee ext with slow eccentric control- AROM 2 sets of 12 reps Seated manual resistive hip adduction hold 5 sec x 12 reps                     PT Education - 05/10/21 1511     Education Details Exercise technique    Person(s) Educated Patient    Methods Explanation;Demonstration;Tactile cues;Verbal cues     Comprehension Verbalized understanding;Returned demonstration;Verbal cues required;Tactile cues required;Need further instruction              PT Short Term Goals - 05/10/21 1413       PT SHORT TERM GOAL #1   Title Pt will be independent with HEP in order to improve strength and balance in order to decrease fall risk and improve function at home and work.    Baseline 01/04/2021= No formal HEP in place; 12/12 no HEP in place; 05/10/2021-Patient and his father were able to report compliance with curent HEP consisting of mostly seated/reclined LE strengthening. Both verbalize no questions at this time.    Time 6    Period Weeks    Status Achieved    Target Date 02/15/21               PT Long Term Goals - 05/10/21 1414       PT LONG TERM GOAL #1   Title Patient will increase BLE gross strength by 1/2 muscle grade to improve functional strength for improved independence with potential gait, increased standing tolerance and increased ADL ability.    Baseline 01/04/2021- Patient presents with 1/5 to 3-/5 B LE strength with MMT; 12/12: goal partially met for Left knee/hip; 05/10/2021= 2-/5 bilateal Hip flex; 3+/5 bilateral Knee ext    Time 12    Period Weeks    Status Partially Met    Target Date 03/29/21      PT LONG TERM GOAL #2   Title Patient will tolerate sitting unsupported demonstrating erect sitting posture for 15 minutes with CGA to demonstrate improved back extensor strength and improved sitting tolerance.    Baseline 01/04/2021- Patient confied to sitting in lift chair or electric power chair with back support and unable to sit upright without physical assistance; 12/12: tolerates <1 minutes upright unsupported sitting. 05/10/2021=static sit with forward trunk lean  in his power wheelchair without back support x approx 3 min.    Time 12    Period Weeks    Status On-going    Target Date 03/29/21      PT LONG TERM GOAL #3   Title Patient will demonstrate ability to perform  static standing in // bars > 2 min with Max Assist  without loss of balance and fair posture for improved overall strength for pre-gait and transfer activities.    Baseline 01/04/2021= Patient current uanble to stand- Dependent on hoyer or sit to stand lift for transfers. 05/10/2021=Not appropriate yet- Currently still dependent with all transfers using hoyer.    Time 12    Period Weeks    Status On-going    Target Date 03/29/21      PT LONG TERM GOAL #4   Title Pt will improve FOTO  score by 10 points or more demonstrating improved perceived functional ability    Baseline FOTO 7 on 10/17; 03/15/21: FOTO 12; 05/10/2021    Time 12    Period Weeks    Status On-going    Target Date 03/29/21                   Plan - 05/10/21 1411     Clinical Impression Statement Patient presents with good motivation today and 20th visit progress note performed today. Patient did score less on FOTO today but this could be as patient is working more on his deficits he realized how much assist he really needs to perform tasks. He acknowledges that he would not be able to sit at side of bed  on his own without support. He does demonstrate improving trunk strength as seen by ability to static sit with forward trunk lean  in his power wheelchair without back support x approx 3 min. He was able able to demonstrate ability to resist with manual muscle testing with hip flex yet would still score a 2-/5 yet able to demo full ROM and slightly resist= 3+/5 B Knee ext. Patient's condition has the potential to improve in response to therapy. Maximum improvement is yet to be obtained. The anticipated improvement is attainable and reasonable in a generally predictable time.    Personal Factors and Comorbidities Comorbidity 3+;Time since onset of injury/illness/exacerbation    Comorbidities CVA, diabetes, Seizures    Examination-Activity Limitations Bathing;Bed Mobility;Bend;Caring for  Others;Carry;Dressing;Hygiene/Grooming;Lift;Locomotion Level;Reach Overhead;Self Feeding;Sit;Squat;Stairs;Stand;Transfers;Toileting    Examination-Participation Restrictions Cleaning;Community Activity;Driving;Laundry;Medication Management;Meal Prep;Occupation;Personal Finances;Shop;Yard Work;Volunteer    Stability/Clinical Decision Making Evolving/Moderate complexity    Rehab Potential Fair    PT Frequency 2x / week    PT Duration 12 weeks    PT Treatment/Interventions ADLs/Self Care Home Management;Cryotherapy;Electrical Stimulation;Moist Heat;Ultrasound;DME Instruction;Gait training;Stair training;Functional mobility training;Therapeutic exercise;Balance training;Patient/family education;Orthotic Fit/Training;Neuromuscular re-education;Wheelchair mobility training;Manual techniques;Passive range of motion;Dry needling;Energy conservation;Taping;Visual/perceptual remediation/compensation;Joint Manipulations    PT Next Visit Plan core strength, postural control, sabina sit to stand if able; Continue with progressive LE Strengthening    PT Home Exercise Plan No changes to HEP today    Consulted and Agree with Plan of Care Patient;Family member/caregiver    Family Member Consulted Dad             Patient will benefit from skilled therapeutic intervention in order to improve the following deficits and impairments:  Abnormal gait, Decreased activity tolerance, Decreased balance, Decreased cognition, Cardiopulmonary status limiting activity, Decreased coordination, Decreased endurance, Decreased knowledge of use of DME, Decreased mobility, Decreased range of motion, Decreased safety awareness, Decreased strength, Difficulty walking, Hypomobility, Impaired perceived functional ability, Impaired flexibility, Impaired UE functional use, Impaired vision/preception, Improper body mechanics, Postural dysfunction, Obesity  Visit Diagnosis: Muscle weakness (generalized)  Difficulty in walking, not  elsewhere classified  Unsteadiness on feet     Problem List Patient Active Problem List   Diagnosis Date Noted   Sepsis due to vancomycin resistant Enterococcus species ([place]) 06/06/2019   SIRS (systemic inflammatory response syndrome) ([place]) 06/05/2019   Acute lower UTI 06/05/2019   VRE (vancomycin-resistant Enterococci) infection 06/05/2019   Anemia 06/05/2019   Skin ulcer of sacrum with necrosis of muscle (HCC)    Urinary retention    Type 2 diabetes mellitus without complication, with long-term current use of insulin (H[facility])    Tachycardia    Lower extremity edema    Acute metabolic encephalopathy    Obstructive sleep apnea    Morbid obesity  with BMI of 60.0-69.9, adult [facility])    Goals of care, counseling/discussion    Palliative care encounter    Sepsis ([place]) 04/27/2019   H/O insulin dependent diabetes mellitus 04/27/2019   History of CVA with residual deficit 04/27/2019   Seizure disorder ([place]) 04/27/2019   Decubitus ulcer of sacral region, stage 4 ([place]) 04/27/2019    Lewis Moccasin, PT 05/11/2021, 2:22 PM  [place] MAIN [facility] SERVICES [address] [place], [place], [address] Phone: [phone]   Fax:  [phone]  Name: Inigo Lantigua MRN: [id number] Date of Birth: March 01, 1996

## 2021-05-12 ENCOUNTER — Encounter: Payer: BC Managed Care – PPO | Admitting: Speech Pathology

## 2021-05-12 ENCOUNTER — Other Ambulatory Visit: Payer: Self-pay

## 2021-05-12 ENCOUNTER — Ambulatory Visit: Payer: BC Managed Care – PPO

## 2021-05-12 ENCOUNTER — Ambulatory Visit: Payer: BC Managed Care – PPO | Admitting: Occupational Therapy

## 2021-05-12 ENCOUNTER — Encounter: Payer: Self-pay | Admitting: Occupational Therapy

## 2021-05-12 DIAGNOSIS — M6281 Muscle weakness (generalized): Secondary | ICD-10-CM

## 2021-05-12 DIAGNOSIS — I6930 Unspecified sequelae of cerebral infarction: Secondary | ICD-10-CM | POA: Diagnosis not present

## 2021-05-12 DIAGNOSIS — R262 Difficulty in walking, not elsewhere classified: Secondary | ICD-10-CM

## 2021-05-12 DIAGNOSIS — R2681 Unsteadiness on feet: Secondary | ICD-10-CM

## 2021-05-12 DIAGNOSIS — R278 Other lack of coordination: Secondary | ICD-10-CM | POA: Diagnosis not present

## 2021-05-12 DIAGNOSIS — R269 Unspecified abnormalities of gait and mobility: Secondary | ICD-10-CM | POA: Diagnosis not present

## 2021-05-12 NOTE — Therapy (Signed)
[place] MAIN [facility] SERVICES [address] [place], [place], [address] Phone: [phone]   Fax:  [phone]  Physical Therapy Treatment  Patient Details  Name: Paul Hall MRN: [id number] Date of Birth: [birth year] Referring Provider (PT): Collene Mares, PA-C   Encounter Date: 05/12/2021   PT End of Session - 05/12/21 1627     Visit Number 21    Number of Visits 25    Date for PT Re-Evaluation 06/16/21    Authorization Type BCBS and [employer] Medicaid- Need auth past 12th visit    Authorization Time Period 01/04/21-03/29/21; Recert 70/35/[id number]/[id number]    Progress Note Due on Visit 30    PT Start Time 1530    PT Stop Time 1600    PT Time Calculation (min) 30 min    Equipment Utilized During Treatment Other (comment)   Hoyer lift   Activity Tolerance Patient tolerated treatment well   queasiness/nausea   Behavior During Therapy WFL for tasks assessed/performed             Past Medical History:  Diagnosis Date   Diabetes mellitus ([place])    Hypertension    Stroke [facility])     Past Surgical History:  Procedure Laterality Date   IVC FILTER PLACEMENT ([facility] HX)     LEG SURGERY     PEG TUBE PLACEMENT     TRACHEOSTOMY      There were no vitals filed for this visit.   Subjective Assessment - 05/12/21 1626     Subjective Patient reports running late due to issues with transportation. He denied any pain and states only minimal soreness after last visit.    Patient is accompained by: Family member    Pertinent History Paul Hall is a 75yoM who presents with severe weakness, quadriparesis, altered sensorium, and visual impairment s/p critical illness and prolonged hospitalization. Pt hospitalized in October 2020 with ARDS 2/2 COVID19 infection. Pt sustained a complex and lengthy hospitalization which included tracheostomy, prolonged sedation, ECMO. In this period pt sustained CVA and SDH. Pt has now been liberated from  tracheostomy and G-tube. Pt has since been hospitalized for wound infection and UTI. Pt lives with parents at home, has hospital bed and left chair, hoyer lift transfers, and power WC for mobility needs. Pt needs heavy physical assistance with ADL 2/2 BUE contractures and motor dysfunction.    Limitations Lifting;Standing;Walking;Writing;Reading;Sitting;House hold activities    How long can you sit comfortably? Patient reports uable to sit up long < 4 hours due to buttock discomfort    How long can you stand comfortably? Patient is currently unable to stand    How long can you walk comfortably? Patient is currently unable to walk    Diagnostic tests No recent relavent diagnostic tests    Patient Stated Goals I want to be able to possibly walk again    Currently in Pain? No/denies               INTERVENTIONS:   *Patient arrived 15 min late due to transportation issues.   Therapeutic Exercises: Focusing on circuit style workout- Postural/abdominal then Upper body then lower body.  Abdominal- Forward/ant trunk lean in w/c x 15 reps UE: Bilateral Shoulder flex x 15 (Limited UE ROM) LE: Light manual resistive hip add squeeze x 15 reps  Abdominal: upper trunk twist x 15 reps both directions UE: Bilateral Shoulder ABD x 15 reps LE: Assisted hip march x 15 reps each  Abdominal: Lateral trunk lean x  15 reps both directions °UE: Chest press (with no back support) - active using arms at approx 90 deg abd x 15 reps °LE: Ham curls (RTB) x 15 reps each ° °Scap row (with no back support) active x 15 reps each  °LE: Knee ext (active) x 15 reps each.  ° °Education provided throughout session via VC/TC and demonstration to facilitate movement at target joints and correct muscle activation for all testing and exercises performed.  ° ° ° ° ° ° ° ° ° ° ° ° ° ° ° ° ° ° ° ° ° ° PT Education - 05/12/21 1627   ° ° Education Details Exercise form/technique   ° Person(s) Educated Patient   ° Methods  Explanation;Demonstration;Tactile cues;Verbal cues   ° Comprehension Verbalized understanding;Returned demonstration;Verbal cues required;Tactile cues required;Need further instruction   ° °  °  ° °  ° ° ° PT Short Term Goals - 05/10/21 1413   ° °  ° PT SHORT TERM GOAL #1  ° Title Pt will be independent with HEP in order to improve strength and balance in order to decrease fall risk and improve function at home and work.   ° Baseline 01/04/2021= No formal HEP in place; 12/12 no HEP in place; 05/10/2021-Patient and his father were able to report compliance with curent HEP consisting of mostly seated/reclined LE strengthening. Both verbalize no questions at this time.   ° Time 6   ° Period Weeks   ° Status Achieved   ° Target Date 02/15/21   ° °  °  ° °  ° ° ° ° PT Long Term Goals - 05/10/21 1414   ° °  ° PT LONG TERM GOAL #1  ° Title Patient will increase BLE gross strength by 1/2 muscle grade to improve functional strength for improved independence with potential gait, increased standing tolerance and increased ADL ability.   ° Baseline 01/04/2021- Patient presents with 1/5 to 3-/5 B LE strength with MMT; 12/12: goal partially met for Left knee/hip; 05/10/2021= 2-/5 bilateal Hip flex; 3+/5 bilateral Knee ext   ° Time 12   ° Period Weeks   ° Status Partially Met   ° Target Date 03/29/21   °  ° PT LONG TERM GOAL #2  ° Title Patient will tolerate sitting unsupported demonstrating erect sitting posture for 15 minutes with CGA to demonstrate improved back extensor strength and improved sitting tolerance.   ° Baseline 01/04/2021- Patient confied to sitting in lift chair or electric power chair with back support and unable to sit upright without physical assistance; 12/12: tolerates <1 minutes upright unsupported sitting. 05/10/2021=static sit with forward trunk lean  in his power wheelchair without back support x approx 3 min.   ° Time 12   ° Period Weeks   ° Status On-going   ° Target Date 03/29/21   °  ° PT LONG TERM GOAL #3  °  Title Patient will demonstrate ability to perform static standing in // bars > 2 min with Max Assist  without loss of balance and fair posture for improved overall strength for pre-gait and transfer activities.   ° Baseline 01/04/2021= Patient current uanble to stand- Dependent on hoyer or sit to stand lift for transfers. 05/10/2021=Not appropriate yet- Currently still dependent with all transfers using hoyer.   ° Time 12   ° Period Weeks   ° Status On-going   ° Target Date 03/29/21   °  ° PT LONG TERM GOAL #4  °   Title Pt will improve FOTO score by 10 points or more demonstrating improved perceived functional ability    Baseline FOTO 7 on 10/17; 03/15/21: FOTO 12; 05/10/2021    Time 12    Period Weeks    Status On-going    Target Date 03/29/21                   Plan - 05/12/21 1625     Clinical Impression Statement Treatment limited secondary to patient arrived late but he presented with excellent motivation and performed several rounds of circuit style workout including abdominal, upper body, and lower body strengthening exercises. He was able to increase his reps and decrease overall rest period between each exercise. He denied any pain and reported/demonstrated fatigue as limiting factor. Pt will continue to benefit from skilled PT intervention to improve his overall function, weight shifting and postiion change ability, LE strength, trunk strength and improve his overall quality of life.    Personal Factors and Comorbidities Comorbidity 3+;Time since onset of injury/illness/exacerbation    Comorbidities CVA, diabetes, Seizures    Examination-Activity Limitations Bathing;Bed Mobility;Bend;Caring for Others;Carry;Dressing;Hygiene/Grooming;Lift;Locomotion Level;Reach Overhead;Self Feeding;Sit;Squat;Stairs;Stand;Transfers;Toileting    Examination-Participation Restrictions Cleaning;Community Activity;Driving;Laundry;Medication Management;Meal Prep;Occupation;Personal Finances;Shop;Yard  Work;Volunteer    Stability/Clinical Decision Making Evolving/Moderate complexity    Rehab Potential Fair    PT Frequency 2x / week    PT Duration 12 weeks    PT Treatment/Interventions ADLs/Self Care Home Management;Cryotherapy;Electrical Stimulation;Moist Heat;Ultrasound;DME Instruction;Gait training;Stair training;Functional mobility training;Therapeutic exercise;Balance training;Patient/family education;Orthotic Fit/Training;Neuromuscular re-education;Wheelchair mobility training;Manual techniques;Passive range of motion;Dry needling;Energy conservation;Taping;Visual/perceptual remediation/compensation;Joint Manipulations    PT Next Visit Plan core strength, postural control, sabina sit to stand if able; Continue with progressive LE Strengthening    PT Home Exercise Plan No changes to HEP today    Consulted and Agree with Plan of Care Patient;Family member/caregiver    Family Member Consulted Dad             Patient will benefit from skilled therapeutic intervention in order to improve the following deficits and impairments:  Abnormal gait, Decreased activity tolerance, Decreased balance, Decreased cognition, Cardiopulmonary status limiting activity, Decreased coordination, Decreased endurance, Decreased knowledge of use of DME, Decreased mobility, Decreased range of motion, Decreased safety awareness, Decreased strength, Difficulty walking, Hypomobility, Impaired perceived functional ability, Impaired flexibility, Impaired UE functional use, Impaired vision/preception, Improper body mechanics, Postural dysfunction, Obesity  Visit Diagnosis: Muscle weakness (generalized)  Difficulty in walking, not elsewhere classified  Unsteadiness on feet     Problem List Patient Active Problem List   Diagnosis Date Noted   Sepsis due to vancomycin resistant Enterococcus species ([place]) 06/06/2019   SIRS (systemic inflammatory response syndrome) ([place]) 06/05/2019   Acute lower UTI 06/05/2019    VRE (vancomycin-resistant Enterococci) infection 06/05/2019   Anemia 06/05/2019   Skin ulcer of sacrum with necrosis of muscle ([place])    Urinary retention    Type 2 diabetes mellitus without complication, with long-term current use of insulin (HCC)    Tachycardia    Lower extremity edema    Acute metabolic encephalopathy    Obstructive sleep apnea    Morbid obesity with BMI of 60.0-69.9, adult (HCC)    Goals of care, counseling/discussion    Palliative care encounter    Sepsis ([place]) 04/27/2019   H/O insulin dependent diabetes mellitus 04/27/2019   History of CVA with residual deficit 04/27/2019   Seizure disorder ([place]) 04/27/2019   Decubitus ulcer of sacral region, stage 4 ([place]) 04/27/2019    Lewis Moccasin, PT  05/12/2021, 4:28 PM ° °[place] °[facility] MAIN REHAB SERVICES °[address] °[place], [place], [address] °Phone: [phone]   Fax:  [phone] ° °Name: Nithin Hakeem Mcallister °MRN: [id number] °Date of Birth: 12/14/1995 ° ° ° °

## 2021-05-12 NOTE — Therapy (Signed)
[facility] MAIN [facility] [address] [place], [place], [address] Phone: [phone]   Fax:  [phone]  Occupational Therapy Treatment  Patient Details  Name: Paul BolderSamuel Hakeem Hall MRN: [id number] Date of Birth: 02/10/1996 Referring Provider (OT): Lenise HeraldBenjamin Mann   Encounter Date: 05/12/2021   OT End of Session - 05/12/21 1742     Visit Number 31    Number of Visits 72    Date for OT Re-Evaluation 06/14/21    Authorization Type Progress report period starting 03/17/2021    OT Start Time 1600    OT Stop Time 1645    OT Time Calculation (min) 45 min    Activity Tolerance Patient tolerated treatment well    Behavior [facility] for tasks assessed/performed             Past Medical History:  Diagnosis Date   Diabetes mellitus (HCC)    Hypertension    Stroke [facility](HCC)     Past Surgical History:  Procedure Laterality Date   IVC FILTER PLACEMENT ([facility] HX)     LEG SURGERY     PEG TUBE PLACEMENT     TRACHEOSTOMY      There were no vitals filed for this visit.   Subjective Assessment - 05/12/21 1740     Subjective  Pt. reports feeling good today.    Patient is accompanied by: Family member    Pertinent History Pt. is a 26 y.o. male who was hospitalized, and diagnosed with COVID-19, sustained a CVA with resultant quadreplegia in 01/2019. Pt. was hospitalized with VRE  UTI. Pt. PMHx includes: urinary retention, Seizure Disorder, Obstructive Sleep Disorder, DM Type II, and Morbid obesity. Pt. resides at home with his parents.    Currently in Pain? Yes    Pain Score 7     Pain Location Finger (Comment which one)   right 3rd, and 4th digit PIPs   Pain Orientation Right    Pain Descriptors / Indicators Discomfort    Pain Onset More than a month ago              OT TREATMENT     Estim attended:   Pt. tolerated neuromuscular estim  for 5 min., cycle time 10/10,  duty cycle 50%. Applied to the right wrist, and digit  extensors, followed by  8 min. Applied to the left wrist, and digit extensors, and 5 min. To the right with the intensity set to 44, and constant monitoring 100% of the time. Manual assist was required for wrist and digit extension with each rep. To hold the position.    Therapeutic Exercise:   Pt. tolerated bilateral wrist flexion, extension, and MP, PIP, and DIP flexion, and extension. Patient tolerated moist heat modality for 5 min. to his bilateral hands prior to range of motion.    Pt. reports 7/10 discomfort initially with gentle ROM extension of the right MPs, and PIPs as pt. keeps the digits held in flexion at rest.  Hand hygiene was performed to the volar surface of the left digits. Pt. tolerated ROM well to the bilateral UEs. No pain reported today. Pt. maintains his bilateral wrists, and digit MPs, PIPs, and DIPs in flexion at rest. Pt. continues to tolerate Estim well to the right, and left wrist, and digit extensors with manual assist. Pt. tolerated UE strengthening with wrist weights. Patient continues to work on normalizing tone in the bilateral upper extremities in order to improve range of motion, and facilitate active  range of motion in order to increase engagement in ADLs and IADL tasks.                          OT Education - 05/12/21 1742     Education Details BUE AROM    Person(s) Educated Patient    Methods Verbal cues;Tactile cues    Comprehension Verbalized understanding;Verbal cues required;Need further instruction;Returned demonstration                 OT Long Term Goals - 05/10/21 1639       OT LONG TERM GOAL #1   Title Pt. will improve FOTO score by 5 points for clinically significant improvements during ADLs, and IADLs,    Baseline 30th visit: FOTO: 37, 21st: FOTO 43. 10th Visit: FOTO score 42 Eval: FOTO score: 41    Time 12    Period Weeks    Status Revised    Target Date 06/14/21      OT LONG TERM GOAL #4   Title Pt. will  demonstrate visual compensatory strategies for navigating through his environment.    Baseline 30th visit: Pt. continues to require assist to navigate through his environment. 20t: pt continues to require assist to navigate environment, minimal knowledge of strategies. Eval: Limited    Time 12    Period Weeks    Status On-going    Target Date 06/14/21      OT LONG TERM GOAL #5   Title Pt. will demonstrate visual compensatory strategies for tabletop tasks within his near space.    Baseline 30th visit: Pt. coninues to present with visual limtiations for tabletop tasks in his near space. Pt. continues to present with limited vision. 21st: Pt can identify objects on tabletop ~waterbottle sized, improves with contrasting colours, cannot ID small onjects    Time 12    Period Weeks    Status On-going    Target Date 06/14/21      OT LONG TERM GOAL #6   Title Pt. will perform hand to mouth patterns with supervision in preparation for self-feeding.    Baseline 30th visit: Pt. continues to present with weakness limiting functional hand to mouth patterns of movement. Pt. is able to perfrom hand to mouth patterns, however reports weakness in his hand  limits a secure grasp on the utensi Pt. requires assist to perform. Eval: Pt. has difficulty    Time 12    Period Weeks    Status Revised    Target Date 06/14/21      OT LONG TERM GOAL #7   Title Pt. will perform one step self-grooming tasks with minA.    Baseline 3oth visit: Pt. continues to have difficulty sustaining the UEs in elevation for the duration required to perfrom self-grooming tasks thoroughly.Eval: pt. has difficulty    Time 12    Period Weeks    Status Revised    Target Date 06/14/21      OT LONG TERM GOAL #8   Title Pt will complete UB dressing tasks with SETUP and assist to thread over hands only    Baseline 30th visit: Min-ModA threading arms, MaxAx's 2 ot bring the jacket over,a nd around his back. .21st: Pt requires MIN A     Time 12    Period Weeks    Status On-going    Target Date 06/14/21      OT LONG TERM GOAL  #9   TITLE Pt will improve B  grip strength by 5# to assist in handling ADL items    Baseline 30th: R: 3#, L: 0# 21st: R grip 3#, L grip 2#    Time 12    Period Weeks    Status On-going    Target Date 06/14/21      OT LONG TERM GOAL  #10   TITLE Pt will improve B lateral pinch strength by 3# to assist in UBD.    Baseline 30th visit: TBD 21st: R lateral pinch 4#, L lateral pinch 3#    Time 12    Period Weeks    Status On-going    Target Date 06/14/21                   Plan - 05/12/21 1742     Clinical Impression Statement Pt. reports 7/10 discomfort initially with gentle ROM extension of the right MPs, and PIPs as pt. keeps the digits held in flexion at rest.  Hand hygiene was performed to the volar surface of the left digits. Pt. tolerated ROM well to the bilateral UEs. No pain reported today. Pt. maintains his bilateral wrists, and digit MPs, PIPs, and DIPs in flexion at rest. Pt. continues to tolerate Estim well to the right, and left wrist, and digit extensors with manual assist. Pt. tolerated UE strengthening with wrist weights. Patient continues to work on normalizing tone in the bilateral upper extremities in order to improve range of motion, and facilitate active range of motion in order to increase engagement in ADLs and IADL tasks.       OT Occupational Profile and History Detailed Assessment- Review of Records and additional review of physical, cognitive, psychosocial history related to current functional performance    Occupational performance deficits (Please refer to evaluation for details): ADL's;IADL's    Body Structure / Function / Physical Skills ADL;FMC;ROM;Dexterity;IADL;Endurance;Obesity;Strength;UE functional use    Rehab Potential Good    Clinical Decision Making Multiple treatment options, significant modification of task necessary    Comorbidities Affecting  Occupational Performance: Presence of comorbidities impacting occupational performance    Modification or Assistance to Complete Evaluation  Max significant modification of tasks or assist is necessary to complete    OT Frequency 2x / week    OT Duration 12 weeks    OT Treatment/Interventions Self-care/ADL training;Therapeutic exercise;Neuromuscular education;Visual/perceptual remediation/compensation;Patient/family education;Therapeutic activities;DME and/or AE instruction;Passive range of motion;Moist Heat;Electrical Stimulation    Consulted and Agree with Plan of Care Patient             Patient will benefit from skilled therapeutic intervention in order to improve the following deficits and impairments:   Body Structure / Function / Physical Skills: ADL, FMC, ROM, Dexterity, IADL, Endurance, Obesity, Strength, UE functional use       Visit Diagnosis: Muscle weakness (generalized)    Problem List Patient Active Problem List   Diagnosis Date Noted   Sepsis due to vancomycin resistant Enterococcus species (HCC) 06/06/2019   SIRS (systemic inflammatory response syndrome) (HCC) 06/05/2019   Acute lower UTI 06/05/2019   VRE (vancomycin-resistant Enterococci) infection 06/05/2019   Anemia 06/05/2019   Skin ulcer of sacrum with necrosis of muscle (HCC)    Urinary retention    Type 2 diabetes mellitus without complication, with long-term current use of insulin (HCC)    Tachycardia    Lower extremity edema    Acute metabolic encephalopathy    Obstructive sleep apnea    Morbid obesity with BMI of 60.0-69.9, adult (HCC)    Goals  of care, counseling/discussion    Palliative care encounter    Sepsis (HCC) 04/27/2019   H/O insulin dependent diabetes mellitus 04/27/2019   History of CVA with residual deficit 04/27/2019   Seizure disorder (HCC) 04/27/2019   Decubitus ulcer of sacral region, stage 4 (HCC) 04/27/2019   Olegario Messier, MS, OTR/L   Olegario Messier, OT 05/12/2021,  5:45 PM  [place] [facility] MAIN [facility] SERVICES [address] [place], [place], [address] Phone: [phone]   Fax:  [phone]  Name: Traiton Lingg MRN: [id number] Date of Birth: 1995/08/16

## 2021-05-17 ENCOUNTER — Encounter: Payer: BC Managed Care – PPO | Admitting: Speech Pathology

## 2021-05-17 ENCOUNTER — Ambulatory Visit: Payer: BC Managed Care – PPO

## 2021-05-17 ENCOUNTER — Ambulatory Visit: Payer: BC Managed Care – PPO | Admitting: Occupational Therapy

## 2021-05-17 NOTE — Patient Instructions (Incomplete)
INTERVENTIONS:    Therapeutic Exercises: Focusing on circuit style workout- Postural/abdominal then Upper body then lower body.   Abdominal- Forward/ant trunk lean in w/c x 15 reps UE: Bilateral Shoulder flex x 15 (Limited UE ROM) LE: Light manual resistive hip add squeeze x 15 reps   Abdominal: upper trunk twist x 15 reps both directions UE: Bilateral Shoulder ABD x 15 reps LE: Assisted hip march x 15 reps each   Abdominal: Lateral trunk lean x 15 reps both directions UE: Chest press (with no back support) - active using arms at approx 90 deg abd x 15 reps LE: Ham curls (RTB) x 15 reps each   Scap row (with no back support) active x 15 reps each  LE: Knee ext (active) x 15 reps each.    Education provided throughout session via VC/TC and demonstration to facilitate movement at target joints and correct muscle activation for all testing and exercises performed.

## 2021-05-19 ENCOUNTER — Ambulatory Visit: Payer: BC Managed Care – PPO | Admitting: Occupational Therapy

## 2021-05-19 ENCOUNTER — Encounter: Payer: BC Managed Care – PPO | Admitting: Speech Pathology

## 2021-05-19 ENCOUNTER — Encounter: Payer: Self-pay | Admitting: Occupational Therapy

## 2021-05-19 ENCOUNTER — Ambulatory Visit: Payer: BC Managed Care – PPO

## 2021-05-19 ENCOUNTER — Other Ambulatory Visit: Payer: Self-pay

## 2021-05-19 DIAGNOSIS — R2681 Unsteadiness on feet: Secondary | ICD-10-CM

## 2021-05-19 DIAGNOSIS — R262 Difficulty in walking, not elsewhere classified: Secondary | ICD-10-CM

## 2021-05-19 DIAGNOSIS — R278 Other lack of coordination: Secondary | ICD-10-CM | POA: Diagnosis not present

## 2021-05-19 DIAGNOSIS — M6281 Muscle weakness (generalized): Secondary | ICD-10-CM

## 2021-05-19 DIAGNOSIS — R269 Unspecified abnormalities of gait and mobility: Secondary | ICD-10-CM | POA: Diagnosis not present

## 2021-05-19 DIAGNOSIS — I6389 Other cerebral infarction: Secondary | ICD-10-CM | POA: Diagnosis not present

## 2021-05-19 DIAGNOSIS — G4733 Obstructive sleep apnea (adult) (pediatric): Secondary | ICD-10-CM | POA: Diagnosis not present

## 2021-05-19 DIAGNOSIS — L8994 Pressure ulcer of unspecified site, stage 4: Secondary | ICD-10-CM | POA: Diagnosis not present

## 2021-05-19 DIAGNOSIS — I6930 Unspecified sequelae of cerebral infarction: Secondary | ICD-10-CM | POA: Diagnosis not present

## 2021-05-19 NOTE — Therapy (Signed)
[place] MAIN [facility] SERVICES [address] [place], [place], [address] Phone: [phone]   Fax:  [phone]  Physical Therapy Treatment  Patient Details  Name: Paul Hall MRN: [id number] Date of Birth: [birth year] Referring Provider (PT): Collene Mares, PA-C   Encounter Date: 05/19/2021   PT End of Session - 05/19/21 1512     Visit Number 22    Number of Visits 25    Date for PT Re-Evaluation 06/16/21    Authorization Type [employer] and [employer]- Need auth past 12th visit    Authorization Time Period 01/04/21-03/29/21; Recert 03/54/6568-04/30/5168    Progress Note Due on Visit 30    PT Start Time 1525    PT Stop Time 1559    PT Time Calculation (min) 34 min    Equipment Utilized During Treatment --   Hoyer lift   Activity Tolerance Patient tolerated treatment well   queasiness/nausea   Behavior During Therapy WFL for tasks assessed/performed             Past Medical History:  Diagnosis Date   Diabetes mellitus ([place])    Hypertension    Stroke [facility])     Past Surgical History:  Procedure Laterality Date   IVC FILTER PLACEMENT ([facility] HX)     LEG SURGERY     PEG TUBE PLACEMENT     TRACHEOSTOMY      There were no vitals filed for this visit.   Subjective Assessment - 05/19/21 1510     Subjective Patient reports having some stomach issues after the superbowl but feeling okay so far today.    Patient is accompained by: Family member    Pertinent History Paul Hall is a 68yoM who presents with severe weakness, quadriparesis, altered sensorium, and visual impairment s/p critical illness and prolonged hospitalization. Pt hospitalized in October 2020 with ARDS 2/2 COVID19 infection. Pt sustained a complex and lengthy hospitalization which included tracheostomy, prolonged sedation, ECMO. In this period pt sustained CVA and SDH. Pt has now been liberated from tracheostomy and G-tube. Pt has since been hospitalized  for wound infection and UTI. Pt lives with parents at home, has hospital bed and left chair, hoyer lift transfers, and power WC for mobility needs. Pt needs heavy physical assistance with ADL 2/2 BUE contractures and motor dysfunction.    Limitations Lifting;Standing;Walking;Writing;Reading;Sitting;House hold activities    How long can you sit comfortably? Patient reports uable to sit up long < 4 hours due to buttock discomfort    How long can you stand comfortably? Patient is currently unable to stand    How long can you walk comfortably? Patient is currently unable to walk    Diagnostic tests No recent relavent diagnostic tests    Patient Stated Goals I want to be able to possibly walk again    Currently in Pain? No/denies              INTERVENTIONS:    Therapeutic Exercises:    Abdominal- Forward/ant trunk lean in w/c x 15 reps UE: unilateral Shoulder flex x 15 (AAROM)  LE:  hip add squeeze x 15 reps (5 sec hold)    Abdominal: upper trunk twist x 15 reps both directions Scapular retractions (AROM) x 15 reps each  LE: Assisted hip march x 15 reps each   Abdominal: AAROM- Lateral trunk lean x 15 reps both directions UE: Chest press (with no back support) - active using arms at approx 90 deg abd x 15 reps  LE: Ham curls (RTB) x 15 reps each   UE: Bicep curl into tricep ext-  active x 15 reps each  LE: Knee ext (active) x 15 reps each.    Education provided throughout session via VC/TC and demonstration to facilitate movement at target joints and correct muscle activation for all testing and exercises performed.                           PT Education - 05/20/21 1512     Education Details Exercise technique    Person(s) Educated Patient    Methods Explanation;Demonstration;Tactile cues;Verbal cues    Comprehension Verbalized understanding;Returned demonstration;Verbal cues required;Tactile cues required;Need further instruction              PT  Short Term Goals - 05/10/21 1413       PT SHORT TERM GOAL #1   Title Pt will be independent with HEP in order to improve strength and balance in order to decrease fall risk and improve function at home and work.    Baseline 01/04/2021= No formal HEP in place; 12/12 no HEP in place; 05/10/2021-Patient and his father were able to report compliance with curent HEP consisting of mostly seated/reclined LE strengthening. Both verbalize no questions at this time.    Time 6    Period Weeks    Status Achieved    Target Date 02/15/21               PT Long Term Goals - 05/10/21 1414       PT LONG TERM GOAL #1   Title Patient will increase BLE gross strength by 1/2 muscle grade to improve functional strength for improved independence with potential gait, increased standing tolerance and increased ADL ability.    Baseline 01/04/2021- Patient presents with 1/5 to 3-/5 B LE strength with MMT; 12/12: goal partially met for Left knee/hip; 05/10/2021= 2-/5 bilateal Hip flex; 3+/5 bilateral Knee ext    Time 12    Period Weeks    Status Partially Met    Target Date 03/29/21      PT LONG TERM GOAL #2   Title Patient will tolerate sitting unsupported demonstrating erect sitting posture for 15 minutes with CGA to demonstrate improved back extensor strength and improved sitting tolerance.    Baseline 01/04/2021- Patient confied to sitting in lift chair or electric power chair with back support and unable to sit upright without physical assistance; 12/12: tolerates <1 minutes upright unsupported sitting. 05/10/2021=static sit with forward trunk lean  in his power wheelchair without back support x approx 3 min.    Time 12    Period Weeks    Status On-going    Target Date 03/29/21      PT LONG TERM GOAL #3   Title Patient will demonstrate ability to perform static standing in // bars > 2 min with Max Assist  without loss of balance and fair posture for improved overall strength for pre-gait and transfer  activities.    Baseline 01/04/2021= Patient current uanble to stand- Dependent on hoyer or sit to stand lift for transfers. 05/10/2021=Not appropriate yet- Currently still dependent with all transfers using hoyer.    Time 12    Period Weeks    Status On-going    Target Date 03/29/21      PT LONG TERM GOAL #4   Title Pt will improve FOTO score by 10 points or more demonstrating improved perceived functional ability  Baseline FOTO 7 on 10/17; 03/15/21: FOTO 12; 05/10/2021    Time 12    Period Weeks    Status On-going    Target Date 03/29/21                   Plan - 05/19/21 1513     Clinical Impression Statement Patient was late for arrival but well motivated and denied pain throughout session. He continues to improve with Upper extremity, trunk, and Lower extremity strength with more active motion. He is requiring less physical assist to raise his arm or move his trunk. Pt will continue to benefit from skilled PT intervention to improve his overall function, weight shifting and postiion change ability, LE strength, trunk strength and improve his overall quality of life    Personal Factors and Comorbidities Comorbidity 3+;Time since onset of injury/illness/exacerbation    Comorbidities CVA, diabetes, Seizures    Examination-Activity Limitations Bathing;Bed Mobility;Bend;Caring for Others;Carry;Dressing;Hygiene/Grooming;Lift;Locomotion Level;Reach Overhead;Self Feeding;Sit;Squat;Stairs;Stand;Transfers;Toileting    Examination-Participation Restrictions Cleaning;Community Activity;Driving;Laundry;Medication Management;Meal Prep;Occupation;Personal Finances;Shop;Yard Work;Volunteer    Stability/Clinical Decision Making Evolving/Moderate complexity    Rehab Potential Fair    PT Frequency 2x / week    PT Duration 12 weeks    PT Treatment/Interventions ADLs/Self Care Home Management;Cryotherapy;Electrical Stimulation;Moist Heat;Ultrasound;DME Instruction;Gait training;Stair  training;Functional mobility training;Therapeutic exercise;Balance training;Patient/family education;Orthotic Fit/Training;Neuromuscular re-education;Wheelchair mobility training;Manual techniques;Passive range of motion;Dry needling;Energy conservation;Taping;Visual/perceptual remediation/compensation;Joint Manipulations    PT Next Visit Plan core strength, postural control, sabina sit to stand if able; Continue with progressive LE Strengthening    PT Home Exercise Plan No changes to HEP today    Consulted and Agree with Plan of Care Patient;Family member/caregiver    Family Member Consulted Dad             Patient will benefit from skilled therapeutic intervention in order to improve the following deficits and impairments:  Abnormal gait, Decreased activity tolerance, Decreased balance, Decreased cognition, Cardiopulmonary status limiting activity, Decreased coordination, Decreased endurance, Decreased knowledge of use of DME, Decreased mobility, Decreased range of motion, Decreased safety awareness, Decreased strength, Difficulty walking, Hypomobility, Impaired perceived functional ability, Impaired flexibility, Impaired UE functional use, Impaired vision/preception, Improper body mechanics, Postural dysfunction, Obesity  Visit Diagnosis: Muscle weakness (generalized)  Difficulty in walking, not elsewhere classified  Unsteadiness on feet     Problem List Patient Active Problem List   Diagnosis Date Noted   Sepsis due to vancomycin resistant Enterococcus species ([place]) 06/06/2019   SIRS (systemic inflammatory response syndrome) ([place]) 06/05/2019   Acute lower UTI 06/05/2019   VRE (vancomycin-resistant Enterococci) infection 06/05/2019   Anemia 06/05/2019   Skin ulcer of sacrum with necrosis of muscle ([place])    Urinary retention    Type 2 diabetes mellitus without complication, with long-term current use of insulin (HCC)    Tachycardia    Lower extremity edema    Acute metabolic  encephalopathy    Obstructive sleep apnea    Morbid obesity with BMI of 60.0-69.9, adult (HCC)    Goals of care, counseling/discussion    Palliative care encounter    Sepsis ([place]) 04/27/2019   H/O insulin dependent diabetes mellitus 04/27/2019   History of CVA with residual deficit 04/27/2019   Seizure disorder ([place]) 04/27/2019   Decubitus ulcer of sacral region, stage 4 ([place]) 04/27/2019    Lewis Moccasin, PT 05/20/2021, 3:21 PM  [place] [address] [place], [place], [address] Phone: [phone]   Fax:  [phone]  Name: Paul Hall  Sky MRN: [id number] Date of Birth: 11-05-1995

## 2021-05-19 NOTE — Therapy (Signed)
[place] MAIN [facility] SERVICES [address] [place], [place], [address] Phone: [phone]   Fax:  [phone]  Occupational Therapy Treatment  Patient Details  Name: Paul Hall MRN: [id number] Date of Birth: 1996/02/24 Referring Provider (OT): Collene Mares   Encounter Date: 05/19/2021   OT End of Session - 05/19/21 1700     Visit Number 32    Number of Visits 72    Date for OT Re-Evaluation 06/14/21    Authorization Type Progress report period starting 03/17/2021    OT Start Time 1604    OT Stop Time 1650    OT Time Calculation (min) 46 min    Equipment Utilized During Treatment tilt in space power wc    Behavior During Therapy WFL for tasks assessed/performed             Past Medical History:  Diagnosis Date   Diabetes mellitus ([place])    Hypertension    Stroke [facility])     Past Surgical History:  Procedure Laterality Date   IVC FILTER PLACEMENT ([place] HX)     LEG SURGERY     PEG TUBE PLACEMENT     TRACHEOSTOMY      There were no vitals filed for this visit.   Subjective Assessment - 05/19/21 1659     Subjective  Pt. reports feeling better today.    Patient is accompanied by: Family member    Pertinent History Pt. is a 26 y.o. male who was hospitalized, and diagnosed with COVID-19, sustained a CVA with resultant quadreplegia in 01/2019. Pt. was hospitalized with VRE  UTI. Pt. PMHx includes: urinary retention, Seizure Disorder, Obstructive Sleep Disorder, DM Type II, and Morbid obesity. Pt. resides at home with his parents.    Currently in Pain? Yes    Pain Location Finger (Comment which one)   left hand, 3rd and 4th digit PIP   Pain Orientation Right    Pain Descriptors / Indicators Discomfort    Pain Type Chronic pain            OT TREATMENT     Estim attended:   Pt. tolerated neuromuscular estim  for cycle time 10/10,  duty cycle 50%. Applied to the right wrist, and digit extensors, followed by  8  min. Applied to the left wrist, and digit extensors, and 64min. To the right with the intensity set to 44, and constant monitoring 100% of the time. Manual assist was required for wrist and digit extension with each rep. To hold the position.    Therapeutic Exercise:   Pt. tolerated bilateral wrist flexion, extension, and MP, PIP, and DIP flexion, and extension. Patient tolerated moist heat modality for 5 min. to his bilateral hands prior to range of motion.    Pt. Reports having an appointment for Botox next week. Pt. tolerated ROM well to the bilateral UEs. Pt. Reports discomfort with right 3rd and 4th digit PIP extension, Pt. maintains his bilateral wrists, and digit MPs, PIPs, and DIPs in flexion at rest. Pt. continues to tolerate Estim well to the right, and left wrist, and digit extensors with manual assist. Pt. tolerated UE ROM well, however had discomfort initially with right 3rd and 4th digit PIP extension. Patient continues to work on normalizing tone in the bilateral upper extremities in order to improve range of motion, and facilitate active range of motion in order to increase engagement in ADLs and IADL tasks.  OT Education - 05/19/21 1659     Education Details BUE AROM    Person(s) Educated Patient    Methods Verbal cues;Tactile cues    Comprehension Verbalized understanding;Verbal cues required;Need further instruction;Returned demonstration                 OT Long Term Goals - 05/10/21 1639       OT LONG TERM GOAL #1   Title Pt. will improve FOTO score by 5 points for clinically significant improvements during ADLs, and IADLs,    Baseline 30th visit: FOTO: 37, 21st: FOTO 43. 10th Visit: FOTO score 42 Eval: FOTO score: 41    Time 12    Period Weeks    Status Revised    Target Date 06/14/21      OT LONG TERM GOAL #4   Title Pt. will demonstrate visual compensatory strategies for navigating through his  environment.    Baseline 30th visit: Pt. continues to require assist to navigate through his environment. 20t: pt continues to require assist to navigate environment, minimal knowledge of strategies. Eval: Limited    Time 12    Period Weeks    Status On-going    Target Date 06/14/21      OT LONG TERM GOAL #5   Title Pt. will demonstrate visual compensatory strategies for tabletop tasks within his near space.    Baseline 30th visit: Pt. coninues to present with visual limtiations for tabletop tasks in his near space. Pt. continues to present with limited vision. 21st: Pt can identify objects on tabletop ~waterbottle sized, improves with contrasting colours, cannot ID small onjects    Time 12    Period Weeks    Status On-going    Target Date 06/14/21      OT LONG TERM GOAL #6   Title Pt. will perform hand to mouth patterns with supervision in preparation for self-feeding.    Baseline 30th visit: Pt. continues to present with weakness limiting functional hand to mouth patterns of movement. Pt. is able to perfrom hand to mouth patterns, however reports weakness in his hand  limits a secure grasp on the utensi Pt. requires assist to perform. Eval: Pt. has difficulty    Time 12    Period Weeks    Status Revised    Target Date 06/14/21      OT LONG TERM GOAL #7   Title Pt. will perform one step self-grooming tasks with minA.    Baseline 3oth visit: Pt. continues to have difficulty sustaining the UEs in elevation for the duration required to perfrom self-grooming tasks thoroughly.Eval: pt. has difficulty    Time 12    Period Weeks    Status Revised    Target Date 06/14/21      OT LONG TERM GOAL #8   Title Pt will complete UB dressing tasks with SETUP and assist to thread over hands only    Baseline 30th visit: Min-ModA threading arms, MaxAx's 2 ot bring the jacket over,a nd around his back. .21st: Pt requires MIN A    Time 12    Period Weeks    Status On-going    Target Date 06/14/21       OT LONG TERM GOAL  #9   TITLE Pt will improve B grip strength by 5# to assist in handling ADL items    Baseline 30th: R: 3#, L: 0# 21st: R grip 3#, L grip 2#    Time 12    Period Weeks  Status On-going    Target Date 06/14/21      OT LONG TERM GOAL  #10   TITLE Pt will improve B lateral pinch strength by 3# to assist in UBD.    Baseline 30th visit: TBD 21st: R lateral pinch 4#, L lateral pinch 3#    Time 12    Period Weeks    Status On-going    Target Date 06/14/21                   Plan - 05/19/21 1700     Clinical Impression Statement Pt. Reports having an appointment for Botox next week. Pt. tolerated ROM well to the bilateral UEs. Pt. Reports discomfort with right 3rd and 4th digit PIP extension, Pt. maintains his bilateral wrists, and digit MPs, PIPs, and DIPs in flexion at rest. Pt. continues to tolerate Estim well to the right, and left wrist, and digit extensors with manual assist. Pt. tolerated UE ROM well, however had discomfort initially with right 3rd and 4th digit PIP extension. Patient continues to work on normalizing tone in the bilateral upper extremities in order to improve range of motion, and facilitate active range of motion in order to increase engagement in ADLs and IADL tasks.   OT Occupational Profile and History Detailed Assessment- Review of Records and additional review of physical, cognitive, psychosocial history related to current functional performance    Occupational performance deficits (Please refer to evaluation for details): ADL's;IADL's    Body Structure / Function / Physical Skills ADL;FMC;ROM;Dexterity;IADL;Endurance;Obesity;Strength;UE functional use    Rehab Potential Good    Clinical Decision Making Multiple treatment options, significant modification of task necessary    Comorbidities Affecting Occupational Performance: Presence of comorbidities impacting occupational performance    Modification or Assistance to Complete Evaluation   Max significant modification of tasks or assist is necessary to complete    OT Frequency 2x / week    OT Duration 12 weeks    OT Treatment/Interventions Self-care/ADL training;Therapeutic exercise;Neuromuscular education;Visual/perceptual remediation/compensation;Patient/family education;Therapeutic activities;DME and/or AE instruction;Passive range of motion;Moist Heat;Electrical Stimulation    Consulted and Agree with Plan of Care Patient             Patient will benefit from skilled therapeutic intervention in order to improve the following deficits and impairments:   Body Structure / Function / Physical Skills: ADL, FMC, ROM, Dexterity, IADL, Endurance, Obesity, Strength, UE functional use       Visit Diagnosis: Muscle weakness (generalized)  Other lack of coordination    Problem List Patient Active Problem List   Diagnosis Date Noted   Sepsis due to vancomycin resistant Enterococcus species ([place]) 06/06/2019   SIRS (systemic inflammatory response syndrome) ([place]) 06/05/2019   Acute lower UTI 06/05/2019   VRE (vancomycin-resistant Enterococci) infection 06/05/2019   Anemia 06/05/2019   Skin ulcer of sacrum with necrosis of muscle ([place])    Urinary retention    Type 2 diabetes mellitus without complication, with long-term current use of insulin ([facility])    Tachycardia    Lower extremity edema    Acute metabolic encephalopathy    Obstructive sleep apnea    Morbid obesity with BMI of 60.0-69.9, adult ([place])    Goals of care, counseling/discussion    Palliative care encounter    Sepsis ([place]) 04/27/2019   H/O insulin dependent diabetes mellitus 04/27/2019   History of CVA with residual deficit 04/27/2019   Seizure disorder ([place]) 04/27/2019   Decubitus ulcer of sacral region, stage 4 ([place]) 04/27/2019  Harrel Carina, MS, OTR/L   Harrel Carina, OT 05/19/2021, [place] MAIN [facility] SERVICES [address]  [place], [place], [address] Phone: [phone]   Fax:  [phone]  Name: Paul Hall MRN: [id number] Date of Birth: 18-Jun-1995

## 2021-05-21 DIAGNOSIS — I6389 Other cerebral infarction: Secondary | ICD-10-CM | POA: Diagnosis not present

## 2021-05-21 DIAGNOSIS — L8994 Pressure ulcer of unspecified site, stage 4: Secondary | ICD-10-CM | POA: Diagnosis not present

## 2021-05-21 DIAGNOSIS — G4733 Obstructive sleep apnea (adult) (pediatric): Secondary | ICD-10-CM | POA: Diagnosis not present

## 2021-05-23 DIAGNOSIS — G4733 Obstructive sleep apnea (adult) (pediatric): Secondary | ICD-10-CM | POA: Diagnosis not present

## 2021-05-24 ENCOUNTER — Ambulatory Visit: Payer: BC Managed Care – PPO | Admitting: Occupational Therapy

## 2021-05-24 ENCOUNTER — Ambulatory Visit: Payer: BC Managed Care – PPO

## 2021-05-24 ENCOUNTER — Encounter: Payer: BC Managed Care – PPO | Admitting: Speech Pathology

## 2021-05-26 ENCOUNTER — Other Ambulatory Visit: Payer: Self-pay

## 2021-05-26 ENCOUNTER — Encounter: Payer: Self-pay | Admitting: Occupational Therapy

## 2021-05-26 ENCOUNTER — Ambulatory Visit: Payer: BC Managed Care – PPO

## 2021-05-26 ENCOUNTER — Encounter: Payer: BC Managed Care – PPO | Admitting: Speech Pathology

## 2021-05-26 ENCOUNTER — Ambulatory Visit: Payer: BC Managed Care – PPO | Admitting: Occupational Therapy

## 2021-05-26 DIAGNOSIS — R278 Other lack of coordination: Secondary | ICD-10-CM | POA: Diagnosis not present

## 2021-05-26 DIAGNOSIS — M6281 Muscle weakness (generalized): Secondary | ICD-10-CM

## 2021-05-26 DIAGNOSIS — I6930 Unspecified sequelae of cerebral infarction: Secondary | ICD-10-CM | POA: Diagnosis not present

## 2021-05-26 DIAGNOSIS — R262 Difficulty in walking, not elsewhere classified: Secondary | ICD-10-CM

## 2021-05-26 DIAGNOSIS — R269 Unspecified abnormalities of gait and mobility: Secondary | ICD-10-CM | POA: Diagnosis not present

## 2021-05-26 DIAGNOSIS — R2681 Unsteadiness on feet: Secondary | ICD-10-CM | POA: Diagnosis not present

## 2021-05-26 NOTE — Therapy (Signed)
[facility] MAIN [facility] SERVICES [address] [place], [place], [address] Phone: [phone]   Fax:  [phone]  Occupational Therapy Treatment  Patient Details  Name: Paul Hall MRN: [id number] Date of Birth: Jul 12, 1995 Referring Provider (OT): Lenise Herald   Encounter Date: 05/26/2021   OT End of Session - 05/26/21 1731     Visit Number 33    Number of Visits 72    Date for OT Re-Evaluation 06/14/21    Authorization Type Progress report period starting 03/17/2021    OT Start Time 1600    OT Stop Time 1645    OT Time Calculation (min) 45 min    Activity Tolerance Patient tolerated treatment well    Behavior During Therapy [facility] for tasks assessed/performed             Past Medical History:  Diagnosis Date   Diabetes mellitus ([facility])    Hypertension    Stroke [facility])     Past Surgical History:  Procedure Laterality Date   IVC FILTER PLACEMENT ([facility] HX)     LEG SURGERY     PEG TUBE PLACEMENT     TRACHEOSTOMY      There were no vitals filed for this visit.   Subjective Assessment - 05/26/21 1731     Subjective  Pt. reports feeling better today.    Patient is accompanied by: Family member    Pertinent History Pt. is a 26 y.o. male who was hospitalized, and diagnosed with COVID-19, sustained a CVA with resultant quadreplegia in 01/2019. Pt. was hospitalized with VRE  UTI. Pt. PMHx includes: urinary retention, Seizure Disorder, Obstructive Sleep Disorder, DM Type II, and Morbid obesity. Pt. resides at home with his parents.    Currently in Pain? No/denies            OT TREATMENT     Estim attended:   Pt. tolerated neuromuscular estim  for cycle time 10/10,  duty cycle 50%. Applied to the bilateral wrist, and digit extensors for  8 min.  with the intensity set to 44, and constant monitoring 100% of the time. Manual assist was required for wrist and digit extension with each rep. To hold the position.     Therapeutic Exercise:   Pt. tolerated bilateral wrist flexion, extension, and MP, PIP, and DIP flexion, and extension. Patient tolerated moist heat modality for 5 min. to his bilateral hands prior to range of motion.    Pt. reports having missed his appointment for Botox last week due to transportation. Pt. is unable to be scheduled until May for Botox.  Pt. tolerated ROM well to the bilateral UEs. Pt. continues to tolerate Estim well to the right, and left wrist, and digit extensors with manual assist. Pt. tolerated UE ROM well, however had discomfort initially with the right 3rd and 4th digit PIP extension. Patient continues to work on normalizing tone in the bilateral upper extremities in order to improve range of motion, and facilitate active range of motion in order to increase engagement in ADLs and IADL tasks.                          OT Education - 05/26/21 1731     Education Details BUE AROM    Person(s) Educated Patient    Methods Verbal cues;Tactile cues    Comprehension Verbalized understanding;Verbal cues required;Need further instruction;Returned demonstration  OT Long Term Goals - 05/10/21 1639       OT LONG TERM GOAL #1   Title Pt. will improve FOTO score by 5 points for clinically significant improvements during ADLs, and IADLs,    Baseline 30th visit: FOTO: 37, 21st: FOTO 43. 10th Visit: FOTO score 42 Eval: FOTO score: 41    Time 12    Period Weeks    Status Revised    Target Date 06/14/21      OT LONG TERM GOAL #4   Title Pt. will demonstrate visual compensatory strategies for navigating through his environment.    Baseline 30th visit: Pt. continues to require assist to navigate through his environment. 20t: pt continues to require assist to navigate environment, minimal knowledge of strategies. Eval: Limited    Time 12    Period Weeks    Status On-going    Target Date 06/14/21      OT LONG TERM GOAL #5   Title Pt.  will demonstrate visual compensatory strategies for tabletop tasks within his near space.    Baseline 30th visit: Pt. coninues to present with visual limtiations for tabletop tasks in his near space. Pt. continues to present with limited vision. 21st: Pt can identify objects on tabletop ~waterbottle sized, improves with contrasting colours, cannot ID small onjects    Time 12    Period Weeks    Status On-going    Target Date 06/14/21      OT LONG TERM GOAL #6   Title Pt. will perform hand to mouth patterns with supervision in preparation for self-feeding.    Baseline 30th visit: Pt. continues to present with weakness limiting functional hand to mouth patterns of movement. Pt. is able to perfrom hand to mouth patterns, however reports weakness in his hand  limits a secure grasp on the utensi Pt. requires assist to perform. Eval: Pt. has difficulty    Time 12    Period Weeks    Status Revised    Target Date 06/14/21      OT LONG TERM GOAL #7   Title Pt. will perform one step self-grooming tasks with minA.    Baseline 3oth visit: Pt. continues to have difficulty sustaining the UEs in elevation for the duration required to perfrom self-grooming tasks thoroughly.Eval: pt. has difficulty    Time 12    Period Weeks    Status Revised    Target Date 06/14/21      OT LONG TERM GOAL #8   Title Pt will complete UB dressing tasks with SETUP and assist to thread over hands only    Baseline 30th visit: Min-ModA threading arms, MaxAx's 2 ot bring the jacket over,a nd around his back. .21st: Pt requires MIN A    Time 12    Period Weeks    Status On-going    Target Date 06/14/21      OT LONG TERM GOAL  #9   TITLE Pt will improve B grip strength by 5# to assist in handling ADL items    Baseline 30th: R: 3#, L: 0# 21st: R grip 3#, L grip 2#    Time 12    Period Weeks    Status On-going    Target Date 06/14/21      OT LONG TERM GOAL  #10   TITLE Pt will improve B lateral pinch strength by 3# to  assist in UBD.    Baseline 30th visit: TBD 21st: R lateral pinch 4#, L lateral pinch 3#  Time 12    Period Weeks    Status On-going    Target Date 06/14/21                   Plan - 05/26/21 1733     Clinical Impression Statement Pt. reports having missed his appointment for Botox last week due to transportation. Pt. is unable to be scheduled until May for Botox.  Pt. tolerated ROM well to the bilateral UEs. Pt. continues to tolerate Estim well to the right, and left wrist, and digit extensors with manual assist. Pt. tolerated UE ROM well, however had discomfort initially with the right 3rd and 4th digit PIP extension. Patient continues to work on normalizing tone in the bilateral upper extremities in order to improve range of motion, and facilitate active range of motion in order to increase engagement in ADLs and IADL tasks.   OT Occupational Profile and History Detailed Assessment- Review of Records and additional review of physical, cognitive, psychosocial history related to current functional performance    Occupational performance deficits (Please refer to evaluation for details): ADL's;IADL's    Body Structure / Function / Physical Skills ADL;FMC;ROM;Dexterity;IADL;Endurance;Obesity;Strength;UE functional use    Rehab Potential Good    Clinical Decision Making Multiple treatment options, significant modification of task necessary    Comorbidities Affecting Occupational Performance: Presence of comorbidities impacting occupational performance    Modification or Assistance to Complete Evaluation  Max significant modification of tasks or assist is necessary to complete    OT Frequency 2x / week    OT Duration 12 weeks    OT Treatment/Interventions Self-care/ADL training;Therapeutic exercise;Neuromuscular education;Visual/perceptual remediation/compensation;Patient/family education;Therapeutic activities;DME and/or AE instruction;Passive range of motion;Moist Heat;Electrical  Stimulation    Consulted and Agree with Plan of Care Patient             Patient will benefit from skilled therapeutic intervention in order to improve the following deficits and impairments:   Body Structure / Function / Physical Skills: ADL, FMC, ROM, Dexterity, IADL, Endurance, Obesity, Strength, UE functional use       Visit Diagnosis: Muscle weakness (generalized)    Problem List Patient Active Problem List   Diagnosis Date Noted   Sepsis due to vancomycin resistant Enterococcus species ([facility]) 06/06/2019   SIRS (systemic inflammatory response syndrome) ([facility]) 06/05/2019   Acute lower UTI 06/05/2019   VRE (vancomycin-resistant Enterococci) infection 06/05/2019   Anemia 06/05/2019   Skin ulcer of sacrum with necrosis of muscle ([facility])    Urinary retention    Type 2 diabetes mellitus without complication, with long-term current use of insulin ([facility])    Tachycardia    Lower extremity edema    Acute metabolic encephalopathy    Obstructive sleep apnea    Morbid obesity with BMI of 60.0-69.9, adult ([facility])    Goals of care, counseling/discussion    Palliative care encounter    Sepsis ([facility]) 04/27/2019   H/O insulin dependent diabetes mellitus 04/27/2019   History of CVA with residual deficit 04/27/2019   Seizure disorder ([facility]) 04/27/2019   Decubitus ulcer of sacral region, stage 4 ([facility]) 04/27/2019   Olegario Messier, MS, OTR/L   Olegario Messier, OT 05/26/2021, 5:34 PM  [place] [facility] MAIN [facility] SERVICES [address] [place], [place], [address] Phone: [phone]   Fax:  [phone]  Name: Tylee Biers MRN: [id number] Date of Birth: 29-Mar-1996

## 2021-05-27 NOTE — Therapy (Signed)
[place] MAIN [facility] SERVICES [address] [place], [place], [address] Phone: [phone]   Fax:  [phone]  Physical Therapy Treatment  Patient Details  Name: Paul Hall MRN: [id number] Date of Birth: 1995-08-18 Referring Provider (PT): Collene Mares, PA-C   Encounter Date: 05/26/2021   PT End of Session - 05/26/21 1601     Visit Number 23    Number of Visits 25    Date for PT Re-Evaluation 06/16/21    Authorization Type [employer] and [employer] Medicaid- Need auth past 12th visit    Authorization Time Period 01/04/21-03/29/21; Recert 85/[id number]/2703-5/00/9381    Progress Note Due on Visit 30    PT Start Time 1526    PT Stop Time 1559    PT Time Calculation (min) 33 min    Equipment Utilized During Treatment --   Hoyer lift   Activity Tolerance Patient tolerated treatment well   queasiness/nausea   Behavior During Therapy WFL for tasks assessed/performed             Past Medical History:  Diagnosis Date   Diabetes mellitus ([place])    Hypertension    Stroke [facility])     Past Surgical History:  Procedure Laterality Date   IVC FILTER PLACEMENT ([facility] HX)     LEG SURGERY     PEG TUBE PLACEMENT     TRACHEOSTOMY      There were no vitals filed for this visit.   Subjective Assessment - 05/26/21 1603     Subjective Patient reports doing well and no new complaints. Reports he missed his appointment for [facility] due to transportation error.    Patient is accompained by: Family member    Pertinent History Paul Hall is a 78yoM who presents with severe weakness, quadriparesis, altered sensorium, and visual impairment s/p critical illness and prolonged hospitalization. Pt hospitalized in October 2020 with ARDS 2/2 COVID19 infection. Pt sustained a complex and lengthy hospitalization which included tracheostomy, prolonged sedation, ECMO. In this period pt sustained CVA and SDH. Pt has now been liberated from tracheostomy and G-tube. Pt  has since been hospitalized for wound infection and UTI. Pt lives with parents at home, has hospital bed and left chair, hoyer lift transfers, and power WC for mobility needs. Pt needs heavy physical assistance with ADL 2/2 BUE contractures and motor dysfunction.    Limitations Lifting;Standing;Walking;Writing;Reading;Sitting;House hold activities    How long can you sit comfortably? Patient reports uable to sit up long < 4 hours due to buttock discomfort    How long can you stand comfortably? Patient is currently unable to stand    How long can you walk comfortably? Patient is currently unable to walk    Diagnostic tests No recent relavent diagnostic tests    Patient Stated Goals I want to be able to possibly walk again    Currently in Pain? No/denies            INTERVENTIONS:   *Treatment limited secondary to patient arrived late.    Manual Hamstring stretching: hold 30 sec x 3 each leg  Therapeutic Exercises:    Knee ext x 20 reps  Manual resistive Hip ABD/ADD x 3 sec hold x 15 reps Hip flex (AAROM) x 20 reps Trunk twist x 20 rep each direction Ant/Post trunk lean in w/c x 20 reps.  Gluteal sets x 5 sec hold x 15 reps  Education provided throughout session via VC/TC and demonstration to facilitate movement at target joints and correct  muscle activation for all testing and exercises performed.                            PT Education - 05/27/21 1601     Education Details Exercise technique    Person(s) Educated Patient    Methods Explanation;Demonstration;Tactile cues;Verbal cues    Comprehension Verbalized understanding;Returned demonstration;Verbal cues required;Tactile cues required;Need further instruction              PT Short Term Goals - 05/10/21 1413       PT SHORT TERM GOAL #1   Title Pt will be independent with HEP in order to improve strength and balance in order to decrease fall risk and improve function at home and work.     Baseline 01/04/2021= No formal HEP in place; 12/12 no HEP in place; 05/10/2021-Patient and his father were able to report compliance with curent HEP consisting of mostly seated/reclined LE strengthening. Both verbalize no questions at this time.    Time 6    Period Weeks    Status Achieved    Target Date 02/15/21               PT Long Term Goals - 05/10/21 1414       PT LONG TERM GOAL #1   Title Patient will increase BLE gross strength by 1/2 muscle grade to improve functional strength for improved independence with potential gait, increased standing tolerance and increased ADL ability.    Baseline 01/04/2021- Patient presents with 1/5 to 3-/5 B LE strength with MMT; 12/12: goal partially met for Left knee/hip; 05/10/2021= 2-/5 bilateal Hip flex; 3+/5 bilateral Knee ext    Time 12    Period Weeks    Status Partially Met    Target Date 03/29/21      PT LONG TERM GOAL #2   Title Patient will tolerate sitting unsupported demonstrating erect sitting posture for 15 minutes with CGA to demonstrate improved back extensor strength and improved sitting tolerance.    Baseline 01/04/2021- Patient confied to sitting in lift chair or electric power chair with back support and unable to sit upright without physical assistance; 12/12: tolerates <1 minutes upright unsupported sitting. 05/10/2021=static sit with forward trunk lean  in his power wheelchair without back support x approx 3 min.    Time 12    Period Weeks    Status On-going    Target Date 03/29/21      PT LONG TERM GOAL #3   Title Patient will demonstrate ability to perform static standing in // bars > 2 min with Max Assist  without loss of balance and fair posture for improved overall strength for pre-gait and transfer activities.    Baseline 01/04/2021= Patient current uanble to stand- Dependent on hoyer or sit to stand lift for transfers. 05/10/2021=Not appropriate yet- Currently still dependent with all transfers using hoyer.    Time 12     Period Weeks    Status On-going    Target Date 03/29/21      PT LONG TERM GOAL #4   Title Pt will improve FOTO score by 10 points or more demonstrating improved perceived functional ability    Baseline FOTO 7 on 10/17; 03/15/21: FOTO 12; 05/10/2021    Time 12    Period Weeks    Status On-going    Target Date 03/29/21  Plan - 05/26/21 1600     Clinical Impression Statement Patient received today in excellent mood and well motivated despite late arrival. He was able to progress with most exercises to 20 reps- exhibiting some fatigue but overall performed very well - responsive to all VC and able to follow cues. Pt will continue to benefit from skilled PT intervention to improve his overall function, weight shifting and postiion change ability, LE strength, trunk strength and improve his overall quality of life    Personal Factors and Comorbidities Comorbidity 3+;Time since onset of injury/illness/exacerbation    Comorbidities CVA, diabetes, Seizures    Examination-Activity Limitations Bathing;Bed Mobility;Bend;Caring for Others;Carry;Dressing;Hygiene/Grooming;Lift;Locomotion Level;Reach Overhead;Self Feeding;Sit;Squat;Stairs;Stand;Transfers;Toileting    Examination-Participation Restrictions Cleaning;Community Activity;Driving;Laundry;Medication Management;Meal Prep;Occupation;Personal Finances;Shop;Yard Work;Volunteer    Stability/Clinical Decision Making Evolving/Moderate complexity    Rehab Potential Fair    PT Frequency 2x / week    PT Duration 12 weeks    PT Treatment/Interventions ADLs/Self Care Home Management;Cryotherapy;Electrical Stimulation;Moist Heat;Ultrasound;DME Instruction;Gait training;Stair training;Functional mobility training;Therapeutic exercise;Balance training;Patient/family education;Orthotic Fit/Training;Neuromuscular re-education;Wheelchair mobility training;Manual techniques;Passive range of motion;Dry needling;Energy  conservation;Taping;Visual/perceptual remediation/compensation;Joint Manipulations    PT Next Visit Plan core strength, postural control, sabina sit to stand if able; Continue with progressive LE Strengthening    PT Home Exercise Plan No changes to HEP today    Consulted and Agree with Plan of Care Patient;Family member/caregiver    Family Member Consulted Dad             Patient will benefit from skilled therapeutic intervention in order to improve the following deficits and impairments:  Abnormal gait, Decreased activity tolerance, Decreased balance, Decreased cognition, Cardiopulmonary status limiting activity, Decreased coordination, Decreased endurance, Decreased knowledge of use of DME, Decreased mobility, Decreased range of motion, Decreased safety awareness, Decreased strength, Difficulty walking, Hypomobility, Impaired perceived functional ability, Impaired flexibility, Impaired UE functional use, Impaired vision/preception, Improper body mechanics, Postural dysfunction, Obesity  Visit Diagnosis: Muscle weakness (generalized)  Difficulty in walking, not elsewhere classified  Unsteadiness on feet     Problem List Patient Active Problem List   Diagnosis Date Noted   Sepsis due to vancomycin resistant Enterococcus species ([place]) 06/06/2019   SIRS (systemic inflammatory response syndrome) ([place]) 06/05/2019   Acute lower UTI 06/05/2019   VRE (vancomycin-resistant Enterococci) infection 06/05/2019   Anemia 06/05/2019   Skin ulcer of sacrum with necrosis of muscle ([place])    Urinary retention    Type 2 diabetes mellitus without complication, with long-term current use of insulin ([facility])    Tachycardia    Lower extremity edema    Acute metabolic encephalopathy    Obstructive sleep apnea    Morbid obesity with BMI of 60.0-69.9, adult ([facility]CC)    Goals of care, counseling/discussion    Palliative care encounter    Sepsis ([place]) 04/27/2019   H/O insulin dependent diabetes mellitus  04/27/2019   History of CVA with residual deficit 04/27/2019   Seizure disorder ([place]) 04/27/2019   Decubitus ulcer of sacral region, stage 4 ([place]) 04/27/2019    Lewis Moccasin, PT 05/27/2021, 4:04 PM  [place] MAIN [facility] SERVICES [address] [place], [place], [address] Phone: [phone]   Fax:  [phone]  Name: Kipton Skillen MRN: [id number] Date of Birth: Sep 29, 1995

## 2021-05-31 ENCOUNTER — Ambulatory Visit: Payer: BC Managed Care – PPO | Admitting: Occupational Therapy

## 2021-05-31 ENCOUNTER — Encounter: Payer: Self-pay | Admitting: Occupational Therapy

## 2021-05-31 ENCOUNTER — Ambulatory Visit: Payer: BC Managed Care – PPO

## 2021-05-31 ENCOUNTER — Other Ambulatory Visit: Payer: Self-pay

## 2021-05-31 ENCOUNTER — Encounter: Payer: BC Managed Care – PPO | Admitting: Speech Pathology

## 2021-05-31 DIAGNOSIS — I6930 Unspecified sequelae of cerebral infarction: Secondary | ICD-10-CM | POA: Diagnosis not present

## 2021-05-31 DIAGNOSIS — M6281 Muscle weakness (generalized): Secondary | ICD-10-CM

## 2021-05-31 DIAGNOSIS — R269 Unspecified abnormalities of gait and mobility: Secondary | ICD-10-CM | POA: Diagnosis not present

## 2021-05-31 DIAGNOSIS — R262 Difficulty in walking, not elsewhere classified: Secondary | ICD-10-CM | POA: Diagnosis not present

## 2021-05-31 DIAGNOSIS — R2681 Unsteadiness on feet: Secondary | ICD-10-CM | POA: Diagnosis not present

## 2021-05-31 DIAGNOSIS — R278 Other lack of coordination: Secondary | ICD-10-CM

## 2021-05-31 NOTE — Therapy (Signed)
[place] MAIN [facility] SERVICES [address] [place], [place], [address] Phone: [phone]   Fax:  [phone]  Physical Therapy Treatment  Patient Details  Name: Paul Hall MRN: [id number] Date of Birth: 1996-03-16 Referring Provider (PT): Collene Mares, PA-C   Encounter Date: 05/31/2021   PT End of Session - 05/31/21 1551     Visit Number 24    Number of Visits 25    Date for PT Re-Evaluation 06/16/21    Authorization Type [employer] and [employer] Medicaid- Need auth past 12th visit    Authorization Time Period 01/04/21-03/29/21; Recert [id number]/62/9528-07/16/2438    Progress Note Due on Visit 30    PT Start Time 1526    PT Stop Time 1559    PT Time Calculation (min) 33 min    Equipment Utilized During Treatment --   Hoyer lift   Activity Tolerance Patient tolerated treatment well   queasiness/nausea   Behavior During Therapy WFL for tasks assessed/performed             Past Medical History:  Diagnosis Date   Diabetes mellitus ([place])    Hypertension    Stroke [facility])     Past Surgical History:  Procedure Laterality Date   IVC FILTER PLACEMENT ([facility] HX)     LEG SURGERY     PEG TUBE PLACEMENT     TRACHEOSTOMY      There were no vitals filed for this visit.   Subjective Assessment - 05/31/21 1536     Subjective Patient reports he had a good weekned and no pain or any issues today.    Patient is accompained by: Family member    Pertinent History Brayton Baumgartner is a 62yoM who presents with severe weakness, quadriparesis, altered sensorium, and visual impairment s/p critical illness and prolonged hospitalization. Pt hospitalized in October 2020 with ARDS 2/2 COVID19 infection. Pt sustained a complex and lengthy hospitalization which included tracheostomy, prolonged sedation, ECMO. In this period pt sustained CVA and SDH. Pt has now been liberated from tracheostomy and G-tube. Pt has since been hospitalized for wound infection and  UTI. Pt lives with parents at home, has hospital bed and left chair, hoyer lift transfers, and power WC for mobility needs. Pt needs heavy physical assistance with ADL 2/2 BUE contractures and motor dysfunction.    Limitations Lifting;Standing;Walking;Writing;Reading;Sitting;House hold activities    How long can you sit comfortably? Patient reports uable to sit up long < 4 hours due to buttock discomfort    How long can you stand comfortably? Patient is currently unable to stand    How long can you walk comfortably? Patient is currently unable to walk    Diagnostic tests No recent relavent diagnostic tests    Patient Stated Goals I want to be able to possibly walk again    Currently in Pain? No/denies              Interventions:   Treatment was limited secondary to patient was 11 min late to PT visit  Therapeutic Exercises:    Pyramid scheme for LE strengthening-   Hip march x 12 reps with AAROM Hip march x 10 reps with AAROM Hip march x 8 reps with AROM  Knee ext x 12 reps BLE - 3 lb on left and right x 12 reps Knee ext x 10 reps BLE- 4 lb on left and 3 lb on right x 10 reps Knee ext x 8 reps BLE- 5 lb on left and 3  lb on right x 8 reps Knee ext x 8 reps BLE- 5 lb on left and 3 lb on right x 8 reps  Hamstring curls - 12 reps BLE using RTB  Hamstring curls- 10 reps BLE uisng GTB Hamstring curls- 8 reps BLE using BTB  Hip add ball squeeze with 3 sec hold - 2 sets of 12 reps.   Education provided throughout session via VC/TC and demonstration to facilitate movement at target joints and correct muscle activation for all testing and exercises performed.                    PT Education - 05/31/21 1551     Education Details Exercise progression and technique    Person(s) Educated Patient    Methods Explanation;Demonstration;Tactile cues;Verbal cues    Comprehension Verbalized understanding;Returned demonstration;Verbal cues required;Tactile cues required;Need  further instruction              PT Short Term Goals - 05/10/21 1413       PT SHORT TERM GOAL #1   Title Pt will be independent with HEP in order to improve strength and balance in order to decrease fall risk and improve function at home and work.    Baseline 01/04/2021= No formal HEP in place; 12/12 no HEP in place; 05/10/2021-Patient and his father were able to report compliance with curent HEP consisting of mostly seated/reclined LE strengthening. Both verbalize no questions at this time.    Time 6    Period Weeks    Status Achieved    Target Date 02/15/21               PT Long Term Goals - 05/10/21 1414       PT LONG TERM GOAL #1   Title Patient will increase BLE gross strength by 1/2 muscle grade to improve functional strength for improved independence with potential gait, increased standing tolerance and increased ADL ability.    Baseline 01/04/2021- Patient presents with 1/5 to 3-/5 B LE strength with MMT; 12/12: goal partially met for Left knee/hip; 05/10/2021= 2-/5 bilateal Hip flex; 3+/5 bilateral Knee ext    Time 12    Period Weeks    Status Partially Met    Target Date 03/29/21      PT LONG TERM GOAL #2   Title Patient will tolerate sitting unsupported demonstrating erect sitting posture for 15 minutes with CGA to demonstrate improved back extensor strength and improved sitting tolerance.    Baseline 01/04/2021- Patient confied to sitting in lift chair or electric power chair with back support and unable to sit upright without physical assistance; 12/12: tolerates <1 minutes upright unsupported sitting. 05/10/2021=static sit with forward trunk lean  in his power wheelchair without back support x approx 3 min.    Time 12    Period Weeks    Status On-going    Target Date 03/29/21      PT LONG TERM GOAL #3   Title Patient will demonstrate ability to perform static standing in // bars > 2 min with Max Assist  without loss of balance and fair posture for improved overall  strength for pre-gait and transfer activities.    Baseline 01/04/2021= Patient current uanble to stand- Dependent on hoyer or sit to stand lift for transfers. 05/10/2021=Not appropriate yet- Currently still dependent with all transfers using hoyer.    Time 12    Period Weeks    Status On-going    Target Date 03/29/21  PT LONG TERM GOAL #4   Title Pt will improve FOTO score by 10 points or more demonstrating improved perceived functional ability    Baseline FOTO 7 on 10/17; 03/15/21: FOTO 12; 05/10/2021    Time 12    Period Weeks    Status On-going    Target Date 03/29/21                   Plan - 05/31/21 1552     Clinical Impression Statement Treatment limited secondary to patient with late arrival. He performed with strengthening exercise today- pushed to finish despite being fatigued. He was able to progressively increase weight today and less assist with right LE. Pt will continue to benefit from skilled PT intervention to improve his overall function, weight shifting and postiion change ability, LE strength, trunk strength and improve his overall quality of life    Personal Factors and Comorbidities Comorbidity 3+;Time since onset of injury/illness/exacerbation    Comorbidities CVA, diabetes, Seizures    Examination-Activity Limitations Bathing;Bed Mobility;Bend;Caring for Others;Carry;Dressing;Hygiene/Grooming;Lift;Locomotion Level;Reach Overhead;Self Feeding;Sit;Squat;Stairs;Stand;Transfers;Toileting    Examination-Participation Restrictions Cleaning;Community Activity;Driving;Laundry;Medication Management;Meal Prep;Occupation;Personal Finances;Shop;Yard Work;Volunteer    Stability/Clinical Decision Making Evolving/Moderate complexity    Rehab Potential Fair    PT Frequency 2x / week    PT Duration 12 weeks    PT Treatment/Interventions ADLs/Self Care Home Management;Cryotherapy;Electrical Stimulation;Moist Heat;Ultrasound;DME Instruction;Gait training;Stair  training;Functional mobility training;Therapeutic exercise;Balance training;Patient/family education;Orthotic Fit/Training;Neuromuscular re-education;Wheelchair mobility training;Manual techniques;Passive range of motion;Dry needling;Energy conservation;Taping;Visual/perceptual remediation/compensation;Joint Manipulations    PT Next Visit Plan core strength, postural control, sabina sit to stand if able; Continue with progressive LE Strengthening    PT Home Exercise Plan No changes to HEP today    Consulted and Agree with Plan of Care Patient;Family member/caregiver    Family Member Consulted Dad             Patient will benefit from skilled therapeutic intervention in order to improve the following deficits and impairments:  Abnormal gait, Decreased activity tolerance, Decreased balance, Decreased cognition, Cardiopulmonary status limiting activity, Decreased coordination, Decreased endurance, Decreased knowledge of use of DME, Decreased mobility, Decreased range of motion, Decreased safety awareness, Decreased strength, Difficulty walking, Hypomobility, Impaired perceived functional ability, Impaired flexibility, Impaired UE functional use, Impaired vision/preception, Improper body mechanics, Postural dysfunction, Obesity  Visit Diagnosis: Muscle weakness (generalized)  Difficulty in walking, not elsewhere classified  Unsteadiness on feet     Problem List Patient Active Problem List   Diagnosis Date Noted   Sepsis due to vancomycin resistant Enterococcus species ([place]) 06/06/2019   SIRS (systemic inflammatory response syndrome) ([place]) 06/05/2019   Acute lower UTI 06/05/2019   VRE (vancomycin-resistant Enterococci) infection 06/05/2019   Anemia 06/05/2019   Skin ulcer of sacrum with necrosis of muscle ([place])    Urinary retention    Type 2 diabetes mellitus without complication, with long-term current use of insulin (HCC)    Tachycardia    Lower extremity edema    Acute metabolic  encephalopathy    Obstructive sleep apnea    Morbid obesity with BMI of 60.0-69.9, adult (HCC)    Goals of care, counseling/discussion    Palliative care encounter    Sepsis ([place]) 04/27/2019   H/O insulin dependent diabetes mellitus 04/27/2019   History of CVA with residual deficit 04/27/2019   Seizure disorder ([place]) 04/27/2019   Decubitus ulcer of sacral region, stage 4 ([place]) 04/27/2019    Lewis Moccasin, PT 05/31/2021, 4:41 PM  [place] [facility] SERVICES [address]  [place], [place], [address] Phone: [phone]   Fax:  [phone]  Name: Torrion Witter MRN: [id number] Date of Birth: [birth year]

## 2021-05-31 NOTE — Therapy (Signed)
[facility] MAIN [facility] SERVICES [address] [place], [place], [address] Phone: [phone]   Fax:  [phone]  Occupational Therapy Treatment  Patient Details  Name: Paul Hall MRN: [id number] Date of Birth: 04/30/1995 Referring Provider (OT): Lenise Herald   Encounter Date: 05/31/2021   OT End of Session - 05/31/21 1703     Visit Number 34    Number of Visits 72    Date for OT Re-Evaluation 06/14/21    Authorization Type Progress report period starting 03/17/2021    OT Start Time 1600    OT Stop Time 1645    OT Time Calculation (min) 45 min    Activity Tolerance Patient tolerated treatment well    Behavior During Therapy [facility] for tasks assessed/performed             Past Medical History:  Diagnosis Date   Diabetes mellitus (HCC)    Hypertension    Stroke [facility])     Past Surgical History:  Procedure Laterality Date   IVC FILTER PLACEMENT ([facility] HX)     LEG SURGERY     PEG TUBE PLACEMENT     TRACHEOSTOMY      There were no vitals filed for this visit.   Subjective Assessment - 05/31/21 1702     Subjective  Pt. reports doing well today    Patient is accompanied by: Family member    Pertinent History Pt. is a 26 y.o. male who was hospitalized, and diagnosed with COVID-19, sustained a CVA with resultant quadreplegia in 01/2019. Pt. was hospitalized with VRE  UTI. Pt. PMHx includes: urinary retention, Seizure Disorder, Obstructive Sleep Disorder, DM Type II, and Morbid obesity. Pt. resides at home with his parents.    Currently in Pain? No/denies            OT TREATMENT     Estim attended:   Pt. tolerated neuromuscular estim  for cycle time 10/10,  duty cycle 50%. Applied to the bilateral wrist, and digit extensors for  8 min.  with the intensity set to 44, and constant monitoring 100% of the time. Manual assist was required for wrist and digit extension with each rep. To hold the position.    Therapeutic  Exercise:   Pt. tolerated bilateral wrist flexion, extension, and MP, PIP, and DIP flexion, and extension. Patient tolerated moist heat modality for 5 min. to his bilateral hands prior to range of motion.    Education was provided to the pt, and his father about resting hand splint options. Pt. presents with a change in skin integrity on the volar surface of the right 4th digit PIP today. Pt. tolerated ROM well to the bilateral UEs. Pt. continues to tolerate Estim well to the right, and left wrist, and digit extensors with manual assist. Pt. tolerated UE ROM well today. No reports of pain with ROM. Patient continues to work on normalizing tone in the bilateral upper extremities in order to improve range of motion, and facilitate active range of motion in order to increase engagement in ADLs and IADL tasks.                      OT Education - 05/31/21 1703     Education Details BUE AROM    Person(s) Educated Patient    Methods Verbal cues;Tactile cues    Comprehension Verbalized understanding;Verbal cues required;Need further instruction;Returned demonstration  OT Long Term Goals - 05/10/21 1639       OT LONG TERM GOAL #1   Title Pt. will improve FOTO score by 5 points for clinically significant improvements during ADLs, and IADLs,    Baseline 30th visit: FOTO: 37, 21st: FOTO 43. 10th Visit: FOTO score 42 Eval: FOTO score: 41    Time 12    Period Weeks    Status Revised    Target Date 06/14/21      OT LONG TERM GOAL #4   Title Pt. will demonstrate visual compensatory strategies for navigating through his environment.    Baseline 30th visit: Pt. continues to require assist to navigate through his environment. 20t: pt continues to require assist to navigate environment, minimal knowledge of strategies. Eval: Limited    Time 12    Period Weeks    Status On-going    Target Date 06/14/21      OT LONG TERM GOAL #5   Title Pt. will demonstrate  visual compensatory strategies for tabletop tasks within his near space.    Baseline 30th visit: Pt. coninues to present with visual limtiations for tabletop tasks in his near space. Pt. continues to present with limited vision. 21st: Pt can identify objects on tabletop ~waterbottle sized, improves with contrasting colours, cannot ID small onjects    Time 12    Period Weeks    Status On-going    Target Date 06/14/21      OT LONG TERM GOAL #6   Title Pt. will perform hand to mouth patterns with supervision in preparation for self-feeding.    Baseline 30th visit: Pt. continues to present with weakness limiting functional hand to mouth patterns of movement. Pt. is able to perfrom hand to mouth patterns, however reports weakness in his hand  limits a secure grasp on the utensi Pt. requires assist to perform. Eval: Pt. has difficulty    Time 12    Period Weeks    Status Revised    Target Date 06/14/21      OT LONG TERM GOAL #7   Title Pt. will perform one step self-grooming tasks with minA.    Baseline 3oth visit: Pt. continues to have difficulty sustaining the UEs in elevation for the duration required to perfrom self-grooming tasks thoroughly.Eval: pt. has difficulty    Time 12    Period Weeks    Status Revised    Target Date 06/14/21      OT LONG TERM GOAL #8   Title Pt will complete UB dressing tasks with SETUP and assist to thread over hands only    Baseline 30th visit: Min-ModA threading arms, MaxAx's 2 ot bring the jacket over,a nd around his back. .21st: Pt requires MIN A    Time 12    Period Weeks    Status On-going    Target Date 06/14/21      OT LONG TERM GOAL  #9   TITLE Pt will improve B grip strength by 5# to assist in handling ADL items    Baseline 30th: R: 3#, L: 0# 21st: R grip 3#, L grip 2#    Time 12    Period Weeks    Status On-going    Target Date 06/14/21      OT LONG TERM GOAL  #10   TITLE Pt will improve B lateral pinch strength by 3# to assist in UBD.     Baseline 30th visit: TBD 21st: R lateral pinch 4#, L lateral pinch 3#  Time 12    Period Weeks    Status On-going    Target Date 06/14/21                   Plan - 05/31/21 1703     Clinical Impression Statement Education was provided to the pt, and his father about resting hand splint options. Pt. presents with a change in skin integrity on the volar surface of the right 4th digit PIP today. Pt. tolerated ROM well to the bilateral UEs. Pt. continues to tolerate Estim well to the right, and left wrist, and digit extensors with manual assist. Pt. tolerated UE ROM well today. No reports of pain with ROM. Patient continues to work on normalizing tone in the bilateral upper extremities in order to improve range of motion, and facilitate active range of motion in order to increase engagement in ADLs and IADL tasks.    OT Occupational Profile and History Detailed Assessment- Review of Records and additional review of physical, cognitive, psychosocial history related to current functional performance    Occupational performance deficits (Please refer to evaluation for details): ADL's;IADL's    Body Structure / Function / Physical Skills ADL;FMC;ROM;Dexterity;IADL;Endurance;Obesity;Strength;UE functional use    Rehab Potential Good    Clinical Decision Making Multiple treatment options, significant modification of task necessary    Comorbidities Affecting Occupational Performance: Presence of comorbidities impacting occupational performance    Modification or Assistance to Complete Evaluation  Max significant modification of tasks or assist is necessary to complete    OT Frequency 2x / week    OT Duration 12 weeks    OT Treatment/Interventions Self-care/ADL training;Therapeutic exercise;Neuromuscular education;Visual/perceptual remediation/compensation;Patient/family education;Therapeutic activities;DME and/or AE instruction;Passive range of motion;Moist Heat;Electrical Stimulation     Consulted and Agree with Plan of Care Patient             Patient will benefit from skilled therapeutic intervention in order to improve the following deficits and impairments:   Body Structure / Function / Physical Skills: ADL, FMC, ROM, Dexterity, IADL, Endurance, Obesity, Strength, UE functional use       Visit Diagnosis: Muscle weakness (generalized)  Other lack of coordination    Problem List Patient Active Problem List   Diagnosis Date Noted   Sepsis due to vancomycin resistant Enterococcus species (HCC) 06/06/2019   SIRS (systemic inflammatory response syndrome) (HCC) 06/05/2019   Acute lower UTI 06/05/2019   VRE (vancomycin-resistant Enterococci) infection 06/05/2019   Anemia 06/05/2019   Skin ulcer of sacrum with necrosis of muscle (HCC)    Urinary retention    Type 2 diabetes mellitus without complication, with long-term current use of insulin (HCC)    Tachycardia    Lower extremity edema    Acute metabolic encephalopathy    Obstructive sleep apnea    Morbid obesity with BMI of 60.0-69.9, adult (HCC)    Goals of care, counseling/discussion    Palliative care encounter    Sepsis (HCC) 04/27/2019   H/O insulin dependent diabetes mellitus 04/27/2019   History of CVA with residual deficit 04/27/2019   Seizure disorder (HCC) 04/27/2019   Decubitus ulcer of sacral region, stage 4 (HCC) 04/27/2019   Olegario Messier, MS, OTR/L   Olegario Messier, OT 05/31/2021, 5:08 PM  [facility] MAIN [facility] SERVICES [address] [place], [place], [address] Phone: [phone]   Fax:  [phone]  Name: Paul Hall MRN: [id number] Date of Birth: 1995/09/16

## 2021-06-02 ENCOUNTER — Ambulatory Visit: Payer: BC Managed Care – PPO | Attending: Physician Assistant | Admitting: Occupational Therapy

## 2021-06-02 ENCOUNTER — Ambulatory Visit: Payer: BC Managed Care – PPO

## 2021-06-02 ENCOUNTER — Encounter: Payer: BC Managed Care – PPO | Admitting: Speech Pathology

## 2021-06-02 ENCOUNTER — Encounter: Payer: Self-pay | Admitting: Occupational Therapy

## 2021-06-02 ENCOUNTER — Other Ambulatory Visit: Payer: Self-pay

## 2021-06-02 DIAGNOSIS — R262 Difficulty in walking, not elsewhere classified: Secondary | ICD-10-CM | POA: Diagnosis not present

## 2021-06-02 DIAGNOSIS — M6281 Muscle weakness (generalized): Secondary | ICD-10-CM

## 2021-06-02 DIAGNOSIS — R2681 Unsteadiness on feet: Secondary | ICD-10-CM | POA: Insufficient documentation

## 2021-06-02 DIAGNOSIS — I6930 Unspecified sequelae of cerebral infarction: Secondary | ICD-10-CM | POA: Insufficient documentation

## 2021-06-02 DIAGNOSIS — R2689 Other abnormalities of gait and mobility: Secondary | ICD-10-CM | POA: Insufficient documentation

## 2021-06-02 DIAGNOSIS — R278 Other lack of coordination: Secondary | ICD-10-CM | POA: Diagnosis not present

## 2021-06-02 DIAGNOSIS — R269 Unspecified abnormalities of gait and mobility: Secondary | ICD-10-CM | POA: Insufficient documentation

## 2021-06-02 NOTE — Therapy (Signed)
[facility] MAIN [facility] SERVICES [address] [place], [place], [address] Phone: [phone]   Fax:  [phone]  Physical Therapy Treatment  Patient Details  Name: Paul Hall MRN: [id number] Date of Birth: 09-15-1995 Referring Provider (PT): Lenise Herald, PA-C   Encounter Date: 06/02/2021   PT End of Session - 06/02/21 1526     Visit Number 25    Number of Visits 25    Date for PT Re-Evaluation 06/16/21    Authorization Type [employer] and [employer]- Need auth past 12th visit    Authorization Time Period 01/04/21-03/29/21; Recert 03/24/2021-06/16/2021    Progress Note Due on Visit 30    PT Start Time 1515    PT Stop Time 1556    PT Time Calculation (min) 41 min    Equipment Utilized During Treatment --   Hoyer lift   Activity Tolerance Patient tolerated treatment well   queasiness/nausea   Behavior During Therapy WFL for tasks assessed/performed             Past Medical History:  Diagnosis Date   Diabetes mellitus (HCC)    Hypertension    Stroke [facility])     Past Surgical History:  Procedure Laterality Date   IVC FILTER PLACEMENT ([facility] HX)     LEG SURGERY     PEG TUBE PLACEMENT     TRACHEOSTOMY      There were no vitals filed for this visit.   Subjective Assessment - 06/02/21 1521     Subjective Patient reports no new issues - stating today has been a good day.    Patient is accompained by: Family member    Pertinent History Paul Hall is a 24yoM who presents with severe weakness, quadriparesis, altered sensorium, and visual impairment s/p critical illness and prolonged hospitalization. Pt hospitalized in October 2020 with ARDS 2/2 COVID19 infection. Pt sustained a complex and lengthy hospitalization which included tracheostomy, prolonged sedation, ECMO. In this period pt sustained CVA and SDH. Pt has now been liberated from tracheostomy and G-tube. Pt has since been hospitalized for wound infection and UTI.  Pt lives with parents at home, has hospital bed and left chair, hoyer lift transfers, and power WC for mobility needs. Pt needs heavy physical assistance with ADL 2/2 BUE contractures and motor dysfunction.    Limitations Lifting;Standing;Walking;Writing;Reading;Sitting;House hold activities    How long can you sit comfortably? Patient reports uable to sit up long < 4 hours due to buttock discomfort    How long can you stand comfortably? Patient is currently unable to stand    How long can you walk comfortably? Patient is currently unable to walk    Diagnostic tests No recent relavent diagnostic tests    Patient Stated Goals I want to be able to possibly walk again    Currently in Pain? No/denies             INTERVENTIONS:   Therapeutic Exercises:   Gluteal sets- hold 5 sec x 15 reps Hip add (squeeze using red ball) x 5 sec hold x 15 reps Hip abd Sets (hold 5 sec) x 15 reps Abdominal crunch forward - x 15 reps Lumbar ext (manual resistance)  x 15 reps Hip march (AAROM) x 15 reps BLE  Trunk Twist (AAROM) x 15 reps BLE Shoulder flex (AROM on left and AAROM on right)  x 15 reps each Resisted shrugs BUE x 15 reps Scap rows (unilateral)  (active) x 15 reps each arm.  LAQ  B LE x 15 reps  + 3 more each leg focusing on eccentric control.   Education provided throughout session via VC/TC and demonstration to facilitate movement at target joints and correct muscle activation for all testing and exercises performed.                         PT Education - 06/02/21 1530     Education Details Exercise techniques    Person(s) Educated Patient    Methods Explanation;Demonstration;Tactile cues;Verbal cues    Comprehension Verbalized understanding;Returned demonstration;Verbal cues required;Tactile cues required;Need further instruction              PT Short Term Goals - 05/10/21 1413       PT SHORT TERM GOAL #1   Title Pt will be independent with HEP in order to  improve strength and balance in order to decrease fall risk and improve function at home and work.    Baseline 01/04/2021= No formal HEP in place; 12/12 no HEP in place; 05/10/2021-Patient and his father were able to report compliance with curent HEP consisting of mostly seated/reclined LE strengthening. Both verbalize no questions at this time.    Time 6    Period Weeks    Status Achieved    Target Date 02/15/21               PT Long Term Goals - 05/10/21 1414       PT LONG TERM GOAL #1   Title Patient will increase BLE gross strength by 1/2 muscle grade to improve functional strength for improved independence with potential gait, increased standing tolerance and increased ADL ability.    Baseline 01/04/2021- Patient presents with 1/5 to 3-/5 B LE strength with MMT; 12/12: goal partially met for Left knee/hip; 05/10/2021= 2-/5 bilateal Hip flex; 3+/5 bilateral Knee ext    Time 12    Period Weeks    Status Partially Met    Target Date 03/29/21      PT LONG TERM GOAL #2   Title Patient will tolerate sitting unsupported demonstrating erect sitting posture for 15 minutes with CGA to demonstrate improved back extensor strength and improved sitting tolerance.    Baseline 01/04/2021- Patient confied to sitting in lift chair or electric power chair with back support and unable to sit upright without physical assistance; 12/12: tolerates <1 minutes upright unsupported sitting. 05/10/2021=static sit with forward trunk lean  in his power wheelchair without back support x approx 3 min.    Time 12    Period Weeks    Status On-going    Target Date 03/29/21      PT LONG TERM GOAL #3   Title Patient will demonstrate ability to perform static standing in // bars > 2 min with Max Assist  without loss of balance and fair posture for improved overall strength for pre-gait and transfer activities.    Baseline 01/04/2021= Patient current uanble to stand- Dependent on hoyer or sit to stand lift for transfers.  05/10/2021=Not appropriate yet- Currently still dependent with all transfers using hoyer.    Time 12    Period Weeks    Status On-going    Target Date 03/29/21      PT LONG TERM GOAL #4   Title Pt will improve FOTO score by 10 points or more demonstrating improved perceived functional ability    Baseline FOTO 7 on 10/17; 03/15/21: FOTO 12; 05/10/2021    Time 12  Period Weeks    Status On-going    Target Date 03/29/21                   Plan - 06/02/21 1525     Clinical Impression Statement Patient presents today with good motivation and able to demo increased reps with most exercises. He required only brief rest break and able to complete several arm/trunk/LE exercises. Pt will continue to benefit from skilled PT intervention to improve his overall function, weight shifting and postiion change ability, LE strength, trunk strength and improve his overall quality of life    Personal Factors and Comorbidities Comorbidity 3+;Time since onset of injury/illness/exacerbation    Comorbidities CVA, diabetes, Seizures    Examination-Activity Limitations Bathing;Bed Mobility;Bend;Caring for Others;Carry;Dressing;Hygiene/Grooming;Lift;Locomotion Level;Reach Overhead;Self Feeding;Sit;Squat;Stairs;Stand;Transfers;Toileting    Examination-Participation Restrictions Cleaning;Community Activity;Driving;Laundry;Medication Management;Meal Prep;Occupation;Personal Finances;Shop;Yard Work;Volunteer    Stability/Clinical Decision Making Evolving/Moderate complexity    Rehab Potential Fair    PT Frequency 2x / week    PT Duration 12 weeks    PT Treatment/Interventions ADLs/Self Care Home Management;Cryotherapy;Electrical Stimulation;Moist Heat;Ultrasound;DME Instruction;Gait training;Stair training;Functional mobility training;Therapeutic exercise;Balance training;Patient/family education;Orthotic Fit/Training;Neuromuscular re-education;Wheelchair mobility training;Manual techniques;Passive range of  motion;Dry needling;Energy conservation;Taping;Visual/perceptual remediation/compensation;Joint Manipulations    PT Next Visit Plan core strength, postural control, sabina sit to stand if able; Continue with progressive LE Strengthening    PT Home Exercise Plan No changes to HEP today    Consulted and Agree with Plan of Care Patient;Family member/caregiver    Family Member Consulted Dad             Patient will benefit from skilled therapeutic intervention in order to improve the following deficits and impairments:  Abnormal gait, Decreased activity tolerance, Decreased balance, Decreased cognition, Cardiopulmonary status limiting activity, Decreased coordination, Decreased endurance, Decreased knowledge of use of DME, Decreased mobility, Decreased range of motion, Decreased safety awareness, Decreased strength, Difficulty walking, Hypomobility, Impaired perceived functional ability, Impaired flexibility, Impaired UE functional use, Impaired vision/preception, Improper body mechanics, Postural dysfunction, Obesity  Visit Diagnosis: Muscle weakness (generalized)  Difficulty in walking, not elsewhere classified  Unsteadiness on feet     Problem List Patient Active Problem List   Diagnosis Date Noted   Sepsis due to vancomycin resistant Enterococcus species (HCC) 06/06/2019   SIRS (systemic inflammatory response syndrome) (HCC) 06/05/2019   Acute lower UTI 06/05/2019   VRE (vancomycin-resistant Enterococci) infection 06/05/2019   Anemia 06/05/2019   Skin ulcer of sacrum with necrosis of muscle (HCC)    Urinary retention    Type 2 diabetes mellitus without complication, with long-term current use of insulin (HCC)    Tachycardia    Lower extremity edema    Acute metabolic encephalopathy    Obstructive sleep apnea    Morbid obesity with BMI of 60.0-69.9, adult (HCC)    Goals of care, counseling/discussion    Palliative care encounter    Sepsis (HCC) 04/27/2019   H/O insulin  dependent diabetes mellitus 04/27/2019   History of CVA with residual deficit 04/27/2019   Seizure disorder (HCC) 04/27/2019   Decubitus ulcer of sacral region, stage 4 (HCC) 04/27/2019    Lenda Kelp, PT 06/03/2021, 3:28 PM  [facility] MAIN [facility] SERVICES [address] [place], [place], [address] Phone: [phone]   Fax:  [phone]  Name: Faruq Hagee MRN: [id number] Date of Birth: [birth year]

## 2021-06-02 NOTE — Therapy (Signed)
[place] [facility] MAIN [facility] SERVICES [address] [place], [place], [address] Phone: [phone]   Fax:  [phone]  Occupational Therapy Treatment  Patient Details  Name: Paul Hall MRN: [id number] Date of Birth: 10/31/1995 Referring Provider (OT): Lenise Herald   Encounter Date: 06/02/2021   OT End of Session - 06/02/21 1746     Visit Number 35    Number of Visits 72    Date for OT Re-Evaluation 06/14/21    Authorization Type Progress report period starting 03/17/2021    OT Start Time 1600    OT Stop Time 1650    OT Time Calculation (min) 50 min    Activity Tolerance Patient tolerated treatment well    Behavior During Therapy [facility] for tasks assessed/performed             Past Medical History:  Diagnosis Date   Diabetes mellitus (HCC)    Hypertension    Stroke [facility])     Past Surgical History:  Procedure Laterality Date   IVC FILTER PLACEMENT ([facility] HX)     LEG SURGERY     PEG TUBE PLACEMENT     TRACHEOSTOMY      There were no vitals filed for this visit.   Subjective Assessment - 06/02/21 1743     Subjective  Pt. reports doing well today    Patient is accompanied by: Family member    Pertinent History Pt. is a 26 y.o. male who was hospitalized, and diagnosed with COVID-19, sustained a CVA with resultant quadreplegia in 01/2019. Pt. was hospitalized with VRE  UTI. Pt. PMHx includes: urinary retention, Seizure Disorder, Obstructive Sleep Disorder, DM Type II, and Morbid obesity. Pt. resides at home with his parents.    Pain Score 8     Pain Location Finger (Comment which one)   right 4th digit   Pain Orientation Right            OT TREATMENT     Estim attended:   Pt. tolerated neuromuscular estim  for cycle time 10/10,  duty cycle 50%. Applied to the left wrist, and digit extensors for  8 min.  with the intensity set to 44, and constant monitoring 100% of the time. Manual assist was required for wrist and  digit extension with each rep. To hold the position.    Therapeutic Exercise:   Pt. tolerated bilateral wrist flexion, extension, and MP, PIP, and DIP flexion, and extension, however presented with 8/10 pain in with gentle ROM to the right 4th digit PIP. Patient tolerated moist heat modality for 5 min. to his bilateral hands prior to range of motion.   Pt. tolerated ROM well to the bilateral UEs, however presented with 8/10 pain with ROM to the right 4th digit PIP. Pt. continues to tolerate Estim well to the left wrist, and digit extensors with manual assist, however was unable to tolerate EStim to the right hand today.  Pt. Was fit for, and provided with a soft Posey hand roll. Pt. Tolerated wearing the hand roll during the treatment session. Education was provided about hand roll application, fit, and application, and recommended wearing. Patient continues to work on normalizing tone in the bilateral upper extremities in order to improve range of motion, and facilitate active range of motion in order to increase engagement in ADLs and IADL tasks.                      OT Education - 06/02/21  1743     Education Details BUE AROM    Person(s) Educated Patient    Methods Verbal cues;Tactile cues    Comprehension Verbalized understanding;Verbal cues required;Need further instruction;Returned demonstration                 OT Long Term Goals - 05/10/21 1639       OT LONG TERM GOAL #1   Title Pt. will improve FOTO score by 5 points for clinically significant improvements during ADLs, and IADLs,    Baseline 30th visit: FOTO: 37, 21st: FOTO 43. 10th Visit: FOTO score 42 Eval: FOTO score: 41    Time 12    Period Weeks    Status Revised    Target Date 06/14/21      OT LONG TERM GOAL #4   Title Pt. will demonstrate visual compensatory strategies for navigating through his environment.    Baseline 30th visit: Pt. continues to require assist to navigate through his  environment. 20t: pt continues to require assist to navigate environment, minimal knowledge of strategies. Eval: Limited    Time 12    Period Weeks    Status On-going    Target Date 06/14/21      OT LONG TERM GOAL #5   Title Pt. will demonstrate visual compensatory strategies for tabletop tasks within his near space.    Baseline 30th visit: Pt. coninues to present with visual limtiations for tabletop tasks in his near space. Pt. continues to present with limited vision. 21st: Pt can identify objects on tabletop ~waterbottle sized, improves with contrasting colours, cannot ID small onjects    Time 12    Period Weeks    Status On-going    Target Date 06/14/21      OT LONG TERM GOAL #6   Title Pt. will perform hand to mouth patterns with supervision in preparation for self-feeding.    Baseline 30th visit: Pt. continues to present with weakness limiting functional hand to mouth patterns of movement. Pt. is able to perfrom hand to mouth patterns, however reports weakness in his hand  limits a secure grasp on the utensi Pt. requires assist to perform. Eval: Pt. has difficulty    Time 12    Period Weeks    Status Revised    Target Date 06/14/21      OT LONG TERM GOAL #7   Title Pt. will perform one step self-grooming tasks with minA.    Baseline 3oth visit: Pt. continues to have difficulty sustaining the UEs in elevation for the duration required to perfrom self-grooming tasks thoroughly.Eval: pt. has difficulty    Time 12    Period Weeks    Status Revised    Target Date 06/14/21      OT LONG TERM GOAL #8   Title Pt will complete UB dressing tasks with SETUP and assist to thread over hands only    Baseline 30th visit: Min-ModA threading arms, MaxAx's 2 ot bring the jacket over,a nd around his back. .21st: Pt requires MIN A    Time 12    Period Weeks    Status On-going    Target Date 06/14/21      OT LONG TERM GOAL  #9   TITLE Pt will improve B grip strength by 5# to assist in handling  ADL items    Baseline 30th: R: 3#, L: 0# 21st: R grip 3#, L grip 2#    Time 12    Period Weeks    Status On-going  Target Date 06/14/21      OT LONG TERM GOAL  #10   TITLE Pt will improve B lateral pinch strength by 3# to assist in UBD.    Baseline 30th visit: TBD 21st: R lateral pinch 4#, L lateral pinch 3#    Time 12    Period Weeks    Status On-going    Target Date 06/14/21                   Plan - 06/02/21 1744     Clinical Impression Statement Pt. tolerated ROM well to the bilateral UEs, however presented with 8/10 pain with ROM to the right 4th digit PIP. Pt. continues to tolerate Estim well to the left wrist, and digit extensors with manual assist, however was unable to tolerate EStim to the right hand today.  Pt. Was fit for, and provided with a soft Posey hand roll. Pt. Tolerated wearing the hand roll during the treatment session. Education was provided about hand roll application, fit, and application, and recommended wearing. Patient continues to work on normalizing tone in the bilateral upper extremities in order to improve range of motion, and facilitate active range of motion in order to increase engagement in ADLs and IADL tasks.   OT Occupational Profile and History Detailed Assessment- Review of Records and additional review of physical, cognitive, psychosocial history related to current functional performance    Occupational performance deficits (Please refer to evaluation for details): ADL's;IADL's    Body Structure / Function / Physical Skills ADL;FMC;ROM;Dexterity;IADL;Endurance;Obesity;Strength;UE functional use    Rehab Potential Good    Clinical Decision Making Multiple treatment options, significant modification of task necessary    Comorbidities Affecting Occupational Performance: Presence of comorbidities impacting occupational performance    Modification or Assistance to Complete Evaluation  Max significant modification of tasks or assist is necessary  to complete    OT Frequency 2x / week    OT Duration 12 weeks    OT Treatment/Interventions Self-care/ADL training;Therapeutic exercise;Neuromuscular education;Visual/perceptual remediation/compensation;Patient/family education;Therapeutic activities;DME and/or AE instruction;Passive range of motion;Moist Heat;Electrical Stimulation    Consulted and Agree with Plan of Care Patient             Patient will benefit from skilled therapeutic intervention in order to improve the following deficits and impairments:   Body Structure / Function / Physical Skills: ADL, FMC, ROM, Dexterity, IADL, Endurance, Obesity, Strength, UE functional use       Visit Diagnosis: Muscle weakness (generalized)    Problem List Patient Active Problem List   Diagnosis Date Noted   Sepsis due to vancomycin resistant Enterococcus species (HCC) 06/06/2019   SIRS (systemic inflammatory response syndrome) (HCC) 06/05/2019   Acute lower UTI 06/05/2019   VRE (vancomycin-resistant Enterococci) infection 06/05/2019   Anemia 06/05/2019   Skin ulcer of sacrum with necrosis of muscle (HCC)    Urinary retention    Type 2 diabetes mellitus without complication, with long-term current use of insulin (HCC)    Tachycardia    Lower extremity edema    Acute metabolic encephalopathy    Obstructive sleep apnea    Morbid obesity with BMI of 60.0-69.9, adult (HCC)    Goals of care, counseling/discussion    Palliative care encounter    Sepsis (HCC) 04/27/2019   H/O insulin dependent diabetes mellitus 04/27/2019   History of CVA with residual deficit 04/27/2019   Seizure disorder (HCC) 04/27/2019   Decubitus ulcer of sacral region, stage 4 (HCC) 04/27/2019   Olegario Messier, MS,  OTR/L   Olegario MessierElaine Inocente Krach, OT 06/02/2021, 5:48 PM  [place] [facility]IN [facility] [address] [place], [place], [address] Phone: [phone]   Fax:  [phone]  Name: Paul Hall MRN:  [id number] Date of Birth: 16-Nov-1995

## 2021-06-07 ENCOUNTER — Ambulatory Visit: Payer: BC Managed Care – PPO | Admitting: Occupational Therapy

## 2021-06-07 ENCOUNTER — Ambulatory Visit: Payer: BC Managed Care – PPO

## 2021-06-07 ENCOUNTER — Other Ambulatory Visit: Payer: Self-pay

## 2021-06-07 ENCOUNTER — Encounter: Payer: Self-pay | Admitting: Occupational Therapy

## 2021-06-07 DIAGNOSIS — R2681 Unsteadiness on feet: Secondary | ICD-10-CM | POA: Diagnosis not present

## 2021-06-07 DIAGNOSIS — R262 Difficulty in walking, not elsewhere classified: Secondary | ICD-10-CM

## 2021-06-07 DIAGNOSIS — R278 Other lack of coordination: Secondary | ICD-10-CM | POA: Diagnosis not present

## 2021-06-07 DIAGNOSIS — R2689 Other abnormalities of gait and mobility: Secondary | ICD-10-CM | POA: Diagnosis not present

## 2021-06-07 DIAGNOSIS — M6281 Muscle weakness (generalized): Secondary | ICD-10-CM

## 2021-06-07 DIAGNOSIS — I6930 Unspecified sequelae of cerebral infarction: Secondary | ICD-10-CM | POA: Diagnosis not present

## 2021-06-07 DIAGNOSIS — R269 Unspecified abnormalities of gait and mobility: Secondary | ICD-10-CM | POA: Diagnosis not present

## 2021-06-07 NOTE — Therapy (Signed)
[place] MAIN [facility] SERVICES [address] [place], [place], [address] Phone: [phone]   Fax:  [phone]  Physical Therapy Treatment  Patient Details  Name: Paul Hall MRN: [id number] Date of Birth: Oct 14, 1995 Referring Provider (PT): Collene Mares, PA-C   Encounter Date: 06/07/2021   PT End of Session - 06/07/21 1348     Visit Number 26    Number of Visits 37    Date for PT Re-Evaluation 06/16/21    Authorization Type [employer] and [employer]- Need auth past 12th visit    Authorization Time Period 01/04/21-03/29/21; Recert [id number]/06/4915-12/18/567    Progress Note Due on Visit 30    PT Start Time 1349    PT Stop Time 1429    PT Time Calculation (min) 40 min    Equipment Utilized During Treatment --   Hoyer lift   Activity Tolerance Patient tolerated treatment well   queasiness/nausea   Behavior During Therapy WFL for tasks assessed/performed             Past Medical History:  Diagnosis Date   Diabetes mellitus ([place])    Hypertension    Stroke [facility])     Past Surgical History:  Procedure Laterality Date   IVC FILTER PLACEMENT ([facility] HX)     LEG SURGERY     PEG TUBE PLACEMENT     TRACHEOSTOMY      There were no vitals filed for this visit.   Subjective Assessment - 06/07/21 1346     Patient is accompained by: Family member    Pertinent History Paul Hall is a 89yoM who presents with severe weakness, quadriparesis, altered sensorium, and visual impairment s/p critical illness and prolonged hospitalization. Pt hospitalized in October 2020 with ARDS 2/2 COVID19 infection. Pt sustained a complex and lengthy hospitalization which included tracheostomy, prolonged sedation, ECMO. In this period pt sustained CVA and SDH. Pt has now been liberated from tracheostomy and G-tube. Pt has since been hospitalized for wound infection and UTI. Pt lives with parents at home, has hospital bed and left chair, hoyer lift  transfers, and power WC for mobility needs. Pt needs heavy physical assistance with ADL 2/2 BUE contractures and motor dysfunction.    Limitations Lifting;Standing;Walking;Writing;Reading;Sitting;House hold activities    How long can you sit comfortably? Patient reports uable to sit up long < 4 hours due to buttock discomfort    How long can you stand comfortably? Patient is currently unable to stand    How long can you walk comfortably? Patient is currently unable to walk    Diagnostic tests No recent relavent diagnostic tests    Patient Stated Goals I want to be able to possibly walk again    Currently in Pain? No/denies             INTERVENTIONS:   Therapeutic Exercises:     Pyramid LE strengthening with LAQ today:   Right LE- 0 lb x 20 reps, 2 lb x 12 reps, 2.5 lb x 10 reps, 2.5 lb x 8 reps  Left LE = 0 lb x 20 reps, 2 lb x 12 reps, 2.5 lb x 10 reps, 3 lb x 8 reps, 4lb x 6 reps   Gluteal sets- hold 5 sec x 15 reps Hip add (squeeze using red ball) x 5 sec hold x 15 reps Hip abd resistive (manual) x 15 reps each LE Abdominal crunch forward - x 15 reps Hip march (AAROM) x 15 reps BLE  Education provided throughout session via VC/TC and demonstration to facilitate movement at target joints and correct muscle activation for all testing and exercises performed.                             PT Education - 06/07/21 1348     Education Details Exercise technique    Person(s) Educated Patient    Methods Explanation;Demonstration;Tactile cues;Verbal cues    Comprehension Verbalized understanding;Returned demonstration;Verbal cues required;Tactile cues required;Need further instruction              PT Short Term Goals - 05/10/21 1413       PT SHORT TERM GOAL #1   Title Pt will be independent with HEP in order to improve strength and balance in order to decrease fall risk and improve function at home and work.    Baseline 01/04/2021= No formal HEP  in place; 12/12 no HEP in place; 05/10/2021-Patient and his father were able to report compliance with curent HEP consisting of mostly seated/reclined LE strengthening. Both verbalize no questions at this time.    Time 6    Period Weeks    Status Achieved    Target Date 02/15/21               PT Long Term Goals - 05/10/21 1414       PT LONG TERM GOAL #1   Title Patient will increase BLE gross strength by 1/2 muscle grade to improve functional strength for improved independence with potential gait, increased standing tolerance and increased ADL ability.    Baseline 01/04/2021- Patient presents with 1/5 to 3-/5 B LE strength with MMT; 12/12: goal partially met for Left knee/hip; 05/10/2021= 2-/5 bilateal Hip flex; 3+/5 bilateral Knee ext    Time 12    Period Weeks    Status Partially Met    Target Date 03/29/21      PT LONG TERM GOAL #2   Title Patient will tolerate sitting unsupported demonstrating erect sitting posture for 15 minutes with CGA to demonstrate improved back extensor strength and improved sitting tolerance.    Baseline 01/04/2021- Patient confied to sitting in lift chair or electric power chair with back support and unable to sit upright without physical assistance; 12/12: tolerates <1 minutes upright unsupported sitting. 05/10/2021=static sit with forward trunk lean  in his power wheelchair without back support x approx 3 min.    Time 12    Period Weeks    Status On-going    Target Date 03/29/21      PT LONG TERM GOAL #3   Title Patient will demonstrate ability to perform static standing in // bars > 2 min with Max Assist  without loss of balance and fair posture for improved overall strength for pre-gait and transfer activities.    Baseline 01/04/2021= Patient current uanble to stand- Dependent on hoyer or sit to stand lift for transfers. 05/10/2021=Not appropriate yet- Currently still dependent with all transfers using hoyer.    Time 12    Period Weeks    Status On-going     Target Date 03/29/21      PT LONG TERM GOAL #4   Title Pt will improve FOTO score by 10 points or more demonstrating improved perceived functional ability    Baseline FOTO 7 on 10/17; 03/15/21: FOTO 12; 05/10/2021    Time 12    Period Weeks    Status On-going    Target Date 03/29/21  Plan - 06/07/21 0840     Clinical Impression Statement Treatment focused on pyramid strengthening for quads and patient responded well - able to add more resistance with less reps to focus on more power. He denied any pain- only fatigue today. He remained well motivated and determined to improve his overall strength. Pt will continue to benefit from skilled PT intervention to improve his overall function, weight shifting and postiion change ability, LE strength, trunk strength and improve his overall quality of life    Personal Factors and Comorbidities Comorbidity 3+;Time since onset of injury/illness/exacerbation    Comorbidities CVA, diabetes, Seizures    Examination-Activity Limitations Bathing;Bed Mobility;Bend;Caring for Others;Carry;Dressing;Hygiene/Grooming;Lift;Locomotion Level;Reach Overhead;Self Feeding;Sit;Squat;Stairs;Stand;Transfers;Toileting    Examination-Participation Restrictions Cleaning;Community Activity;Driving;Laundry;Medication Management;Meal Prep;Occupation;Personal Finances;Shop;Yard Work;Volunteer    Stability/Clinical Decision Making Evolving/Moderate complexity    Rehab Potential Fair    PT Frequency 2x / week    PT Duration 12 weeks    PT Treatment/Interventions ADLs/Self Care Home Management;Cryotherapy;Electrical Stimulation;Moist Heat;Ultrasound;DME Instruction;Gait training;Stair training;Functional mobility training;Therapeutic exercise;Balance training;Patient/family education;Orthotic Fit/Training;Neuromuscular re-education;Wheelchair mobility training;Manual techniques;Passive range of motion;Dry needling;Energy conservation;Taping;Visual/perceptual  remediation/compensation;Joint Manipulations    PT Next Visit Plan core strength, postural control, sabina sit to stand if able; Continue with progressive LE Strengthening    PT Home Exercise Plan No changes to HEP today    Consulted and Agree with Plan of Care Patient;Family member/caregiver    Family Member Consulted Dad             Patient will benefit from skilled therapeutic intervention in order to improve the following deficits and impairments:  Abnormal gait, Decreased activity tolerance, Decreased balance, Decreased cognition, Cardiopulmonary status limiting activity, Decreased coordination, Decreased endurance, Decreased knowledge of use of DME, Decreased mobility, Decreased range of motion, Decreased safety awareness, Decreased strength, Difficulty walking, Hypomobility, Impaired perceived functional ability, Impaired flexibility, Impaired UE functional use, Impaired vision/preception, Improper body mechanics, Postural dysfunction, Obesity  Visit Diagnosis: Muscle weakness (generalized)  Difficulty in walking, not elsewhere classified  Unsteadiness on feet     Problem List Patient Active Problem List   Diagnosis Date Noted   Sepsis due to vancomycin resistant Enterococcus species ([place]) 06/06/2019   SIRS (systemic inflammatory response syndrome) ([place]) 06/05/2019   Acute lower UTI 06/05/2019   VRE (vancomycin-resistant Enterococci) infection 06/05/2019   Anemia 06/05/2019   Skin ulcer of sacrum with necrosis of muscle ([place])    Urinary retention    Type 2 diabetes mellitus without complication, with long-term current use of insulin (HCC)    Tachycardia    Lower extremity edema    Acute metabolic encephalopathy    Obstructive sleep apnea    Morbid obesity with BMI of 60.0-69.9, adult (HCC)    Goals of care, counseling/discussion    Palliative care encounter    Sepsis ([place]) 04/27/2019   H/O insulin dependent diabetes mellitus 04/27/2019   History of CVA with residual  deficit 04/27/2019   Seizure disorder ([place]) 04/27/2019   Decubitus ulcer of sacral region, stage 4 ([place]) 04/27/2019    Lewis Moccasin, PT 06/08/2021, 8:42 AM  [place] MAIN [facility] SERVICES [address] [place], [place], [address] Phone: [phone]   Fax:  [phone]  Name: Mathis Cashman MRN: [id number] Date of Birth: 1996/03/28

## 2021-06-07 NOTE — Therapy (Signed)
[facility] MAIN [facility] SERVICES [address] [place], [place], [address] Phone: [phone]   Fax:  [phone]  Occupational Therapy Treatment  Patient Details  Name: Paul Hall MRN: [id number] Date of Birth: 06/20/1995 Referring Provider (OT): Lenise Herald   Encounter Date: 06/07/2021   OT End of Session - 06/07/21 1358     Visit Number 36    Number of Visits 72    Date for OT Re-Evaluation 06/14/21    Authorization Type Progress report period starting 03/17/2021    OT Start Time 1300    OT Stop Time 1345    OT Time Calculation (min) 45 min    Activity Tolerance Patient tolerated treatment well    Behavior During Therapy [facility] for tasks assessed/performed             Past Medical History:  Diagnosis Date   Diabetes mellitus (HCC)    Hypertension    Stroke [facility])     Past Surgical History:  Procedure Laterality Date   IVC FILTER PLACEMENT ([facility] HX)     LEG SURGERY     PEG TUBE PLACEMENT     TRACHEOSTOMY      There were no vitals filed for this visit.   Subjective Assessment - 06/07/21 1358     Subjective  Pt. reports doing well today    Patient is accompanied by: Family member    Pertinent History Pt. is a 26 y.o. male who was hospitalized, and diagnosed with COVID-19, sustained a CVA with resultant quadreplegia in 01/2019. Pt. was hospitalized with VRE  UTI. Pt. PMHx includes: urinary retention, Seizure Disorder, Obstructive Sleep Disorder, DM Type II, and Morbid obesity. Pt. resides at home with his parents.    Currently in Pain? No/denies            OT TREATMENT     Estim attended:   Pt. tolerated neuromuscular estim  for cycle time 10/10,  duty cycle 50%. Applied to the left wrist, and digit extensors for  8 min.  with the intensity set to 44, and constant monitoring 100% of the time. Manual assist was required for wrist and digit extension with each rep. To hold the position.    Therapeutic  Exercise:   Pt. tolerated bilateral wrist flexion, extension, and MP, PIP, and DIP flexion, and extension, however presented with 8/10 pain in with gentle ROM to the right 4th digit PIP. Patient tolerated moist heat modality for 5 min. to his bilateral hands prior to range of motion.    Pt. tolerated ROM well to the bilateral UEs, however presented with no reports of pain with ROM to the right 4th digit PIP today. Pt. continues to tolerate Estim well to the bilateral wrist, and digit extensors with manual assist. Pt. Tolerated wearing the hand roll during the treatment session. Patient continues to work on normalizing tone in the bilateral upper extremities in order to improve range of motion, and facilitate active range of motion in order to increase engagement in ADLs and IADL tasks.                          OT Education - 06/07/21 1358     Education Details BUE AROM    Person(s) Educated Patient    Methods Verbal cues;Tactile cues    Comprehension Verbalized understanding;Verbal cues required;Need further instruction;Returned demonstration  OT Long Term Goals - 05/10/21 1639       OT LONG TERM GOAL #1   Title Pt. will improve FOTO score by 5 points for clinically significant improvements during ADLs, and IADLs,    Baseline 30th visit: FOTO: 37, 21st: FOTO 43. 10th Visit: FOTO score 42 Eval: FOTO score: 41    Time 12    Period Weeks    Status Revised    Target Date 06/14/21      OT LONG TERM GOAL #4   Title Pt. will demonstrate visual compensatory strategies for navigating through his environment.    Baseline 30th visit: Pt. continues to require assist to navigate through his environment. 20t: pt continues to require assist to navigate environment, minimal knowledge of strategies. Eval: Limited    Time 12    Period Weeks    Status On-going    Target Date 06/14/21      OT LONG TERM GOAL #5   Title Pt. will demonstrate visual compensatory  strategies for tabletop tasks within his near space.    Baseline 30th visit: Pt. coninues to present with visual limtiations for tabletop tasks in his near space. Pt. continues to present with limited vision. 21st: Pt can identify objects on tabletop ~waterbottle sized, improves with contrasting colours, cannot ID small onjects    Time 12    Period Weeks    Status On-going    Target Date 06/14/21      OT LONG TERM GOAL #6   Title Pt. will perform hand to mouth patterns with supervision in preparation for self-feeding.    Baseline 30th visit: Pt. continues to present with weakness limiting functional hand to mouth patterns of movement. Pt. is able to perfrom hand to mouth patterns, however reports weakness in his hand  limits a secure grasp on the utensi Pt. requires assist to perform. Eval: Pt. has difficulty    Time 12    Period Weeks    Status Revised    Target Date 06/14/21      OT LONG TERM GOAL #7   Title Pt. will perform one step self-grooming tasks with minA.    Baseline 3oth visit: Pt. continues to have difficulty sustaining the UEs in elevation for the duration required to perfrom self-grooming tasks thoroughly.Eval: pt. has difficulty    Time 12    Period Weeks    Status Revised    Target Date 06/14/21      OT LONG TERM GOAL #8   Title Pt will complete UB dressing tasks with SETUP and assist to thread over hands only    Baseline 30th visit: Min-ModA threading arms, MaxAx's 2 ot bring the jacket over,a nd around his back. .21st: Pt requires MIN A    Time 12    Period Weeks    Status On-going    Target Date 06/14/21      OT LONG TERM GOAL  #9   TITLE Pt will improve B grip strength by 5# to assist in handling ADL items    Baseline 30th: R: 3#, L: 0# 21st: R grip 3#, L grip 2#    Time 12    Period Weeks    Status On-going    Target Date 06/14/21      OT LONG TERM GOAL  #10   TITLE Pt will improve B lateral pinch strength by 3# to assist in UBD.    Baseline 30th visit:  TBD 21st: R lateral pinch 4#, L lateral pinch 3#  Time 12    Period Weeks    Status On-going    Target Date 06/14/21                   Plan - 06/07/21 1359     Clinical Impression Statement Pt. tolerated ROM well to the bilateral UEs, however presented with no reports of pain with ROM to the right 4th digit PIP today. Pt. continues to tolerate Estim well to the bilateral wrist, and digit extensors with manual assist. Pt. Tolerated wearing the hand roll during the treatment session. Patient continues to work on normalizing tone in the bilateral upper extremities in order to improve range of motion, and facilitate active range of motion in order to increase engagement in ADLs and IADL tasks.   OT Occupational Profile and History Detailed Assessment- Review of Records and additional review of physical, cognitive, psychosocial history related to current functional performance    Occupational performance deficits (Please refer to evaluation for details): ADL's;IADL's    Body Structure / Function / Physical Skills ADL;FMC;ROM;Dexterity;IADL;Endurance;Obesity;Strength;UE functional use    Rehab Potential Good    Clinical Decision Making Multiple treatment options, significant modification of task necessary    Comorbidities Affecting Occupational Performance: Presence of comorbidities impacting occupational performance    Modification or Assistance to Complete Evaluation  Max significant modification of tasks or assist is necessary to complete    OT Frequency 2x / week    OT Duration 12 weeks    OT Treatment/Interventions Self-care/ADL training;Therapeutic exercise;Neuromuscular education;Visual/perceptual remediation/compensation;Patient/family education;Therapeutic activities;DME and/or AE instruction;Passive range of motion;Moist Heat;Electrical Stimulation    Consulted and Agree with Plan of Care Patient             Patient will benefit from skilled therapeutic intervention in  order to improve the following deficits and impairments:   Body Structure / Function / Physical Skills: ADL, FMC, ROM, Dexterity, IADL, Endurance, Obesity, Strength, UE functional use       Visit Diagnosis: Muscle weakness (generalized)  Other lack of coordination    Problem List Patient Active Problem List   Diagnosis Date Noted   Sepsis due to vancomycin resistant Enterococcus species (HCC) 06/06/2019   SIRS (systemic inflammatory response syndrome) (HCC) 06/05/2019   Acute lower UTI 06/05/2019   VRE (vancomycin-resistant Enterococci) infection 06/05/2019   Anemia 06/05/2019   Skin ulcer of sacrum with necrosis of muscle (HCC)    Urinary retention    Type 2 diabetes mellitus without complication, with long-term current use of insulin (HCC)    Tachycardia    Lower extremity edema    Acute metabolic encephalopathy    Obstructive sleep apnea    Morbid obesity with BMI of 60.0-69.9, adult (HCC)    Goals of care, counseling/discussion    Palliative care encounter    Sepsis (HCC) 04/27/2019   H/O insulin dependent diabetes mellitus 04/27/2019   History of CVA with residual deficit 04/27/2019   Seizure disorder (HCC) 04/27/2019   Decubitus ulcer of sacral region, stage 4 (HCC) 04/27/2019   Olegario Messier, MS, OTR/L   Olegario Messier, OT 06/07/2021, 2:01 PM  [facility] MAIN [facility] SERVICES [address] [place], [place], [address] Phone: [phone]   Fax:  [phone]  Name: Paul Hall MRN: [id number] Date of Birth: 1996/01/24

## 2021-06-08 DIAGNOSIS — H47293 Other optic atrophy, bilateral: Secondary | ICD-10-CM | POA: Diagnosis not present

## 2021-06-09 ENCOUNTER — Ambulatory Visit: Payer: BC Managed Care – PPO

## 2021-06-09 ENCOUNTER — Ambulatory Visit: Payer: BC Managed Care – PPO | Admitting: Occupational Therapy

## 2021-06-14 ENCOUNTER — Ambulatory Visit: Payer: BC Managed Care – PPO | Admitting: Occupational Therapy

## 2021-06-14 ENCOUNTER — Ambulatory Visit: Payer: BC Managed Care – PPO

## 2021-06-14 ENCOUNTER — Other Ambulatory Visit: Payer: Self-pay

## 2021-06-14 ENCOUNTER — Encounter: Payer: Self-pay | Admitting: Occupational Therapy

## 2021-06-14 DIAGNOSIS — R278 Other lack of coordination: Secondary | ICD-10-CM | POA: Diagnosis not present

## 2021-06-14 DIAGNOSIS — R2681 Unsteadiness on feet: Secondary | ICD-10-CM | POA: Diagnosis not present

## 2021-06-14 DIAGNOSIS — R262 Difficulty in walking, not elsewhere classified: Secondary | ICD-10-CM

## 2021-06-14 DIAGNOSIS — M6281 Muscle weakness (generalized): Secondary | ICD-10-CM | POA: Diagnosis not present

## 2021-06-14 DIAGNOSIS — I6930 Unspecified sequelae of cerebral infarction: Secondary | ICD-10-CM | POA: Diagnosis not present

## 2021-06-14 DIAGNOSIS — R2689 Other abnormalities of gait and mobility: Secondary | ICD-10-CM | POA: Diagnosis not present

## 2021-06-14 DIAGNOSIS — R269 Unspecified abnormalities of gait and mobility: Secondary | ICD-10-CM | POA: Diagnosis not present

## 2021-06-14 NOTE — Therapy (Incomplete)
[place] MAIN [facility] SERVICES [address] [place], [place], [address] Phone: [phone]   Fax:  [phone]  Physical Therapy Treatment  Patient Details  Name: Paul Hall MRN: [id number] Date of Birth: [birth year] Referring Provider (PT): Collene Mares, PA-C   Encounter Date: 06/14/2021   PT End of Session - 06/14/21 1339     Visit Number 27    Number of Visits 37    Date for PT Re-Evaluation 06/16/21    Authorization Type [employer] and [employer]- Need auth past 12th visit    Authorization Time Period 01/04/21-03/29/21; Recert [id number]    Progress Note Due on Visit 30    Equipment Utilized During Treatment --   Hoyer lift   Activity Tolerance Patient tolerated treatment well   queasiness/nausea   Behavior During Therapy WFL for tasks assessed/performed             Past Medical History:  Diagnosis Date   Diabetes mellitus ([place])    Hypertension    Stroke [facility])     Past Surgical History:  Procedure Laterality Date   IVC FILTER PLACEMENT ([facility] HX)     LEG SURGERY     PEG TUBE PLACEMENT     TRACHEOSTOMY      There were no vitals filed for this visit.   Subjective Assessment - 06/14/21 1339     Patient is accompained by: Family member    Pertinent History Paul Hall is a 42yoM who presents with severe weakness, quadriparesis, altered sensorium, and visual impairment s/p critical illness and prolonged hospitalization. Pt hospitalized in October 2020 with ARDS 2/2 COVID19 infection. Pt sustained a complex and lengthy hospitalization which included tracheostomy, prolonged sedation, ECMO. In this period pt sustained CVA and SDH. Pt has now been liberated from tracheostomy and G-tube. Pt has since been hospitalized for wound infection and UTI. Pt lives with parents at home, has hospital bed and left chair, hoyer lift transfers, and power WC for mobility needs. Pt needs heavy physical assistance  with ADL 2/2 BUE contractures and motor dysfunction.    Limitations Lifting;Standing;Walking;Writing;Reading;Sitting;House hold activities    How long can you sit comfortably? Patient reports uable to sit up long < 4 hours due to buttock discomfort    How long can you stand comfortably? Patient is currently unable to stand    How long can you walk comfortably? Patient is currently unable to walk    Diagnostic tests No recent relavent diagnostic tests    Patient Stated Goals I want to be able to possibly walk again    Currently in Pain? No/denies              INTERVENTIONS:     Knee ext B LE Gluteal sets- hold 5 sec x 15 reps Hip add (squeeze using red ball) x 5 sec hold x 15 reps Hip abd resistive (manual) x 15 reps each LE Abdominal crunch forward - x 15 reps Hip march (AAROM) x 15 reps BLE                          PT Short Term Goals - 05/10/21 1413       PT SHORT TERM GOAL #1   Title Pt will be independent with HEP in order to improve strength and balance in order to decrease fall risk and improve function at home and work.    Baseline 01/04/2021= No formal HEP in place; 12/12 no HEP  in place; 05/10/2021-Patient and his father were able to report compliance with curent HEP consisting of mostly seated/reclined LE strengthening. Both verbalize no questions at this time.    Time 6    Period Weeks    Status Achieved    Target Date 02/15/21               PT Long Term Goals - 05/10/21 1414       PT LONG TERM GOAL #1   Title Patient will increase BLE gross strength by 1/2 muscle grade to improve functional strength for improved independence with potential gait, increased standing tolerance and increased ADL ability.    Baseline 01/04/2021- Patient presents with 1/5 to 3-/5 B LE strength with MMT; 12/12: goal partially met for Left knee/hip; 05/10/2021= 2-/5 bilateal Hip flex; 3+/5 bilateral Knee ext    Time 12    Period Weeks    Status Partially Met     Target Date 03/29/21      PT LONG TERM GOAL #2   Title Patient will tolerate sitting unsupported demonstrating erect sitting posture for 15 minutes with CGA to demonstrate improved back extensor strength and improved sitting tolerance.    Baseline 01/04/2021- Patient confied to sitting in lift chair or electric power chair with back support and unable to sit upright without physical assistance; 12/12: tolerates <1 minutes upright unsupported sitting. 05/10/2021=static sit with forward trunk lean  in his power wheelchair without back support x approx 3 min.    Time 12    Period Weeks    Status On-going    Target Date 03/29/21      PT LONG TERM GOAL #3   Title Patient will demonstrate ability to perform static standing in // bars > 2 min with Max Assist  without loss of balance and fair posture for improved overall strength for pre-gait and transfer activities.    Baseline 01/04/2021= Patient current uanble to stand- Dependent on hoyer or sit to stand lift for transfers. 05/10/2021=Not appropriate yet- Currently still dependent with all transfers using hoyer.    Time 12    Period Weeks    Status On-going    Target Date 03/29/21      PT LONG TERM GOAL #4   Title Pt will improve FOTO score by 10 points or more demonstrating improved perceived functional ability    Baseline FOTO 7 on 10/17; 03/15/21: FOTO 12; 05/10/2021    Time 12    Period Weeks    Status On-going    Target Date 03/29/21                   Plan - 06/14/21 1340     Personal Factors and Comorbidities Comorbidity 3+;Time since onset of injury/illness/exacerbation    Comorbidities CVA, diabetes, Seizures    Examination-Activity Limitations Bathing;Bed Mobility;Bend;Caring for Others;Carry;Dressing;Hygiene/Grooming;Lift;Locomotion Level;Reach Overhead;Self Feeding;Sit;Squat;Stairs;Stand;Transfers;Toileting    Examination-Participation Restrictions Cleaning;Community Activity;Driving;Laundry;Medication Management;Meal  Prep;Occupation;Personal Finances;Shop;Yard Work;Volunteer    Stability/Clinical Decision Making Evolving/Moderate complexity    Rehab Potential Fair    PT Frequency 2x / week    PT Duration 12 weeks    PT Treatment/Interventions ADLs/Self Care Home Management;Cryotherapy;Electrical Stimulation;Moist Heat;Ultrasound;DME Instruction;Gait training;Stair training;Functional mobility training;Therapeutic exercise;Balance training;Patient/family education;Orthotic Fit/Training;Neuromuscular re-education;Wheelchair mobility training;Manual techniques;Passive range of motion;Dry needling;Energy conservation;Taping;Visual/perceptual remediation/compensation;Joint Manipulations    PT Next Visit Plan core strength, postural control, sabina sit to stand if able; Continue with progressive LE Strengthening    PT Home Exercise Plan No changes to HEP today    Consulted  and Agree with Plan of Care Patient;Family member/caregiver    Family Member Consulted Dad             Patient will benefit from skilled therapeutic intervention in order to improve the following deficits and impairments:  Abnormal gait, Decreased activity tolerance, Decreased balance, Decreased cognition, Cardiopulmonary status limiting activity, Decreased coordination, Decreased endurance, Decreased knowledge of use of DME, Decreased mobility, Decreased range of motion, Decreased safety awareness, Decreased strength, Difficulty walking, Hypomobility, Impaired perceived functional ability, Impaired flexibility, Impaired UE functional use, Impaired vision/preception, Improper body mechanics, Postural dysfunction, Obesity  Visit Diagnosis: Muscle weakness (generalized)  Difficulty in walking, not elsewhere classified  Other lack of coordination     Problem List Patient Active Problem List   Diagnosis Date Noted   Sepsis due to vancomycin resistant Enterococcus species ([place]) 06/06/2019   SIRS (systemic inflammatory response syndrome)  ([place]) 06/05/2019   Acute lower UTI 06/05/2019   VRE (vancomycin-resistant Enterococci) infection 06/05/2019   Anemia 06/05/2019   Skin ulcer of sacrum with necrosis of muscle ([place])    Urinary retention    Type 2 diabetes mellitus without complication, with long-term current use of insulin (HCC)    Tachycardia    Lower extremity edema    Acute metabolic encephalopathy    Obstructive sleep apnea    Morbid obesity with BMI of 60.0-69.9, adult (HCC)    Goals of care, counseling/discussion    Palliative care encounter    Sepsis ([place]) 04/27/2019   H/O insulin dependent diabetes mellitus 04/27/2019   History of CVA with residual deficit 04/27/2019   Seizure disorder ([place]) 04/27/2019   Decubitus ulcer of sacral region, stage 4 ([place]) 04/27/2019    Lewis Moccasin, PT 06/14/2021, 1:41 PM  [place] MAIN [facility] SERVICES [address] [place], [place], [address] Phone: [phone]   Fax:  [phone]  Name: Marcellus Pulliam MRN: [id number] Date of Birth: 1995-07-13

## 2021-06-14 NOTE — Therapy (Incomplete)
[place] MAIN [facility] SERVICES [address] [place], [place], [address] Phone: [phone]   Fax:  [phone]  Physical Therapy Treatment  Patient Details  Name: Paul Hall MRN: [id number] Date of Birth: 05/02/1995 Referring Provider (PT): Collene Mares, PA-C   Encounter Date: 06/14/2021   PT End of Session - 06/14/21 1339     Visit Number 27    Number of Visits 37    Date for PT Re-Evaluation 06/16/21    Authorization Type [employer] and [employer]- Need auth past 12th visit    Authorization Time Period 01/04/21-03/29/21; Recert [id number]    Progress Note Due on Visit 30    Equipment Utilized During Treatment --   Hoyer lift   Activity Tolerance Patient tolerated treatment well   queasiness/nausea   Behavior During Therapy WFL for tasks assessed/performed             Past Medical History:  Diagnosis Date   Diabetes mellitus ([place])    Hypertension    Stroke [facility])     Past Surgical History:  Procedure Laterality Date   IVC FILTER PLACEMENT ([facility] HX)     LEG SURGERY     PEG TUBE PLACEMENT     TRACHEOSTOMY      There were no vitals filed for this visit.   Subjective Assessment - 06/14/21 1339     Patient is accompained by: Family member    Pertinent History Paul Hall is a 68yoM who presents with severe weakness, quadriparesis, altered sensorium, and visual impairment s/p critical illness and prolonged hospitalization. Pt hospitalized in October 2020 with ARDS 2/2 COVID19 infection. Pt sustained a complex and lengthy hospitalization which included tracheostomy, prolonged sedation, ECMO. In this period pt sustained CVA and SDH. Pt has now been liberated from tracheostomy and G-tube. Pt has since been hospitalized for wound infection and UTI. Pt lives with parents at home, has hospital bed and left chair, hoyer lift transfers, and power WC for mobility needs. Pt needs heavy physical assistance with ADL  2/2 BUE contractures and motor dysfunction.    Limitations Lifting;Standing;Walking;Writing;Reading;Sitting;House hold activities    How long can you sit comfortably? Patient reports uable to sit up long < 4 hours due to buttock discomfort    How long can you stand comfortably? Patient is currently unable to stand    How long can you walk comfortably? Patient is currently unable to walk    Diagnostic tests No recent relavent diagnostic tests    Patient Stated Goals I want to be able to possibly walk again    Currently in Pain? No/denies              INTERVENTIONS:                               PT Short Term Goals - 05/10/21 1413       PT SHORT TERM GOAL #1   Title Pt will be independent with HEP in order to improve strength and balance in order to decrease fall risk and improve function at home and work.    Baseline 01/04/2021= No formal HEP in place; 12/12 no HEP in place; 05/10/2021-Patient and his father were able to report compliance with curent HEP consisting of mostly seated/reclined LE strengthening. Both verbalize no questions at this time.    Time 6    Period Weeks    Status Achieved    Target Date  02/15/21   ° °  °  ° °  ° ° ° ° PT Long Term Goals - 05/10/21 1414   ° °  ° PT LONG TERM GOAL #1  ° Title Patient will increase BLE gross strength by 1/2 muscle grade to improve functional strength for improved independence with potential gait, increased standing tolerance and increased ADL ability.   ° Baseline 01/04/2021- Patient presents with 1/5 to 3-/5 B LE strength with MMT; 12/12: goal partially met for Left knee/hip; 05/10/2021= 2-/5 bilateal Hip flex; 3+/5 bilateral Knee ext   ° Time 12   ° Period Weeks   ° Status Partially Met   ° Target Date 03/29/21   °  ° PT LONG TERM GOAL #2  ° Title Patient will tolerate sitting unsupported demonstrating erect sitting posture for 15 minutes with CGA to demonstrate improved back extensor strength and improved sitting  tolerance.   ° Baseline 01/04/2021- Patient confied to sitting in lift chair or electric power chair with back support and unable to sit upright without physical assistance; 12/12: tolerates <1 minutes upright unsupported sitting. 05/10/2021=static sit with forward trunk lean  in his power wheelchair without back support x approx 3 min.   ° Time 12   ° Period Weeks   ° Status On-going   ° Target Date 03/29/21   °  ° PT LONG TERM GOAL #3  ° Title Patient will demonstrate ability to perform static standing in // bars > 2 min with Max Assist  without loss of balance and fair posture for improved overall strength for pre-gait and transfer activities.   ° Baseline 01/04/2021= Patient current uanble to stand- Dependent on hoyer or sit to stand lift for transfers. 05/10/2021=Not appropriate yet- Currently still dependent with all transfers using hoyer.   ° Time 12   ° Period Weeks   ° Status On-going   ° Target Date 03/29/21   °  ° PT LONG TERM GOAL #4  ° Title Pt will improve FOTO score by 10 points or more demonstrating improved perceived functional ability   ° Baseline FOTO 7 on 10/17; 03/15/21: FOTO 12; 05/10/2021   ° Time 12   ° Period Weeks   ° Status On-going   ° Target Date 03/29/21   ° °  °  ° °  ° ° ° ° ° ° ° ° Plan - 06/14/21 1340   ° ° Personal Factors and Comorbidities Comorbidity 3+;Time since onset of injury/illness/exacerbation   ° Comorbidities CVA, diabetes, Seizures   ° Examination-Activity Limitations Bathing;Bed Mobility;Bend;Caring for Others;Carry;Dressing;Hygiene/Grooming;Lift;Locomotion Level;Reach Overhead;Self Feeding;Sit;Squat;Stairs;Stand;Transfers;Toileting   ° Examination-Participation Restrictions Cleaning;Community Activity;Driving;Laundry;Medication Management;Meal Prep;Occupation;Personal Finances;Shop;Yard Work;Volunteer   ° Stability/Clinical Decision Making Evolving/Moderate complexity   ° Rehab Potential Fair   ° PT Frequency 2x / week   ° PT Duration 12 weeks   ° PT Treatment/Interventions  ADLs/Self Care Home Management;Cryotherapy;Electrical Stimulation;Moist Heat;Ultrasound;DME Instruction;Gait training;Stair training;Functional mobility training;Therapeutic exercise;Balance training;Patient/family education;Orthotic Fit/Training;Neuromuscular re-education;Wheelchair mobility training;Manual techniques;Passive range of motion;Dry needling;Energy conservation;Taping;Visual/perceptual remediation/compensation;Joint Manipulations   ° PT Next Visit Plan core strength, postural control, sabina sit to stand if able; Continue with progressive LE Strengthening   ° PT Home Exercise Plan No changes to HEP today   ° Consulted and Agree with Plan of Care Patient;Family member/caregiver   ° Family Member Consulted Dad   ° °  °  ° °  ° ° °Patient will benefit from skilled therapeutic intervention in order to improve the following deficits and impairments:  Abnormal gait, Decreased   activity tolerance, Decreased balance, Decreased cognition, Cardiopulmonary status limiting activity, Decreased coordination, Decreased endurance, Decreased knowledge of use of DME, Decreased mobility, Decreased range of motion, Decreased safety awareness, Decreased strength, Difficulty walking, Hypomobility, Impaired perceived functional ability, Impaired flexibility, Impaired UE functional use, Impaired vision/preception, Improper body mechanics, Postural dysfunction, Obesity ° °Visit Diagnosis: °Muscle weakness (generalized) ° °Difficulty in walking, not elsewhere classified ° °Other lack of coordination ° ° ° ° °Problem List °Patient Active Problem List  ° Diagnosis Date Noted  ° Sepsis due to vancomycin resistant Enterococcus species (HCC) 06/06/2019  ° SIRS (systemic inflammatory response syndrome) (HCC) 06/05/2019  ° Acute lower UTI 06/05/2019  ° VRE (vancomycin-resistant Enterococci) infection 06/05/2019  ° Anemia 06/05/2019  ° Skin ulcer of sacrum with necrosis of muscle (HCC)   ° Urinary retention   ° Type 2 diabetes mellitus  without complication, with long-term current use of insulin (HCC)   ° Tachycardia   ° Lower extremity edema   ° Acute metabolic encephalopathy   ° Obstructive sleep apnea   ° Morbid obesity with BMI of 60.0-69.9, adult (HCC)   ° Goals of care, counseling/discussion   ° Palliative care encounter   ° Sepsis (HCC) 04/27/2019  ° H/O insulin dependent diabetes mellitus 04/27/2019  ° History of CVA with residual deficit 04/27/2019  ° Seizure disorder (HCC) 04/27/2019  ° Decubitus ulcer of sacral region, stage 4 (HCC) 04/27/2019  ° ° °Jeffrey N Westbrooks, PT °06/14/2021, 1:41 PM ° °[place] °[facility] REH[facility] SERVICES °[address] °[place], [place], [address] °Phone: [phone]   Fax:  [phone] ° °Name: Paul Hall °MRN: [id number] °Date of Birth: 12/13/1995 ° ° ° °

## 2021-06-14 NOTE — Therapy (Signed)
[facility] MAIN [facility] SERVICES [address] [place], [place], [address] Phone: [phone]   Fax:  [phone]  Occupational Therapy Treatment  Patient Details  Name: Paul Hall MRN: [id number] Date of Birth: [birth year] Referring Provider (OT): Lenise Herald   Encounter Date: 06/14/2021   OT End of Session - 06/14/21 1422     Visit Number 37    Number of Visits 72    Date for OT Re-Evaluation 06/14/21    Authorization Type Progress report period starting 03/17/2021    OT Start Time 1300    OT Stop Time 1345    OT Time Calculation (min) 45 min    Activity Tolerance Patient tolerated treatment well    Behavior During Therapy [facility] for tasks assessed/performed             Past Medical History:  Diagnosis Date   Diabetes mellitus (HCC)    Hypertension    Stroke [facility])     Past Surgical History:  Procedure Laterality Date   IVC FILTER PLACEMENT ([facility] HX)     LEG SURGERY     PEG TUBE PLACEMENT     TRACHEOSTOMY      There were no vitals filed for this visit.   Subjective Assessment - 06/14/21 1421     Subjective  Pt. reports doing well today    Patient is accompanied by: Family member    Pertinent History Pt. is a 26 y.o. male who was hospitalized, and diagnosed with COVID-19, sustained a CVA with resultant quadreplegia in 01/2019. Pt. was hospitalized with VRE  UTI. Pt. PMHx includes: urinary retention, Seizure Disorder, Obstructive Sleep Disorder, DM Type II, and Morbid obesity. Pt. resides at home with his parents.    Currently in Pain? No/denies            OT TREATMENT     Estim attended:   Pt. tolerated neuromuscular estim  for cycle time 10/10,  duty cycle 50%. Applied to the bilateral wrist, and digit extensors for  4 min.  with the intensity set to 40-44, and constant monitoring 100% of the time. Manual assist was required for wrist and digit extension with each rep. To hold the position.    Therapeutic  Exercise:   Pt. tolerated bilateral wrist flexion, extension, and MP, PIP, and DIP flexion, and extension, however presented with 8/10 pain in with gentle ROM to the right 4th digit PIP. Patient tolerated moist heat modality for 5 min. to his bilateral hands prior to range of motion.    Pt. tolerated ROM well to the bilateral wrist, and digits. Pt. continues to tolerate Estim well to the bilateral wrist, and digit extensors with manual assist. Pt./caregiver education was provided about resting hand splint options to help align the wrist. Pt. Reports that the right hand Posey roll is working well for him. Patient continues to work on normalizing tone in the bilateral upper extremities in order to improve range of motion, and facilitate active range of motion in order to increase engagement in ADLs and IADL tasks.                          OT Education - 06/14/21 1422     Education Details BUE AROM    Person(s) Educated Patient    Methods Verbal cues;Tactile cues    Comprehension Verbalized understanding;Verbal cues required;Need further instruction;Returned demonstration  OT Long Term Goals - 05/10/21 1639       OT LONG TERM GOAL #1   Title Pt. will improve FOTO score by 5 points for clinically significant improvements during ADLs, and IADLs,    Baseline 30th visit: FOTO: 37, 21st: FOTO 43. 10th Visit: FOTO score 42 Eval: FOTO score: 41    Time 12    Period Weeks    Status Revised    Target Date 06/14/21      OT LONG TERM GOAL #4   Title Pt. will demonstrate visual compensatory strategies for navigating through his environment.    Baseline 30th visit: Pt. continues to require assist to navigate through his environment. 20t: pt continues to require assist to navigate environment, minimal knowledge of strategies. Eval: Limited    Time 12    Period Weeks    Status On-going    Target Date 06/14/21      OT LONG TERM GOAL #5   Title Pt. will  demonstrate visual compensatory strategies for tabletop tasks within his near space.    Baseline 30th visit: Pt. coninues to present with visual limtiations for tabletop tasks in his near space. Pt. continues to present with limited vision. 21st: Pt can identify objects on tabletop ~waterbottle sized, improves with contrasting colours, cannot ID small onjects    Time 12    Period Weeks    Status On-going    Target Date 06/14/21      OT LONG TERM GOAL #6   Title Pt. will perform hand to mouth patterns with supervision in preparation for self-feeding.    Baseline 30th visit: Pt. continues to present with weakness limiting functional hand to mouth patterns of movement. Pt. is able to perfrom hand to mouth patterns, however reports weakness in his hand  limits a secure grasp on the utensi Pt. requires assist to perform. Eval: Pt. has difficulty    Time 12    Period Weeks    Status Revised    Target Date 06/14/21      OT LONG TERM GOAL #7   Title Pt. will perform one step self-grooming tasks with minA.    Baseline 3oth visit: Pt. continues to have difficulty sustaining the UEs in elevation for the duration required to perfrom self-grooming tasks thoroughly.Eval: pt. has difficulty    Time 12    Period Weeks    Status Revised    Target Date 06/14/21      OT LONG TERM GOAL #8   Title Pt will complete UB dressing tasks with SETUP and assist to thread over hands only    Baseline 30th visit: Min-ModA threading arms, MaxAx's 2 ot bring the jacket over,a nd around his back. .21st: Pt requires MIN A    Time 12    Period Weeks    Status On-going    Target Date 06/14/21      OT LONG TERM GOAL  #9   TITLE Pt will improve B grip strength by 5# to assist in handling ADL items    Baseline 30th: R: 3#, L: 0# 21st: R grip 3#, L grip 2#    Time 12    Period Weeks    Status On-going    Target Date 06/14/21      OT LONG TERM GOAL  #10   TITLE Pt will improve B lateral pinch strength by 3# to assist  in UBD.    Baseline 30th visit: TBD 21st: R lateral pinch 4#, L lateral pinch 3#  Time 12    Period Weeks    Status On-going    Target Date 06/14/21                   Plan - 06/14/21 1423     Clinical Impression Statement Pt. tolerated ROM well to the bilateral wrist, and digits. Pt. continues to tolerate Estim well to the bilateral wrist, and digit extensors with manual assist. Pt./caregiver education was provided about resting hand splint options to help align the wrist. Pt. Reports that the right hand Posey roll is working well for him. Patient continues to work on normalizing tone in the bilateral upper extremities in order to improve range of motion, and facilitate active range of motion in order to increase engagement in ADLs and IADL tasks.   OT Occupational Profile and History Detailed Assessment- Review of Records and additional review of physical, cognitive, psychosocial history related to current functional performance    Occupational performance deficits (Please refer to evaluation for details): ADL's;IADL's    Body Structure / Function / Physical Skills ADL;FMC;ROM;Dexterity;IADL;Endurance;Obesity;Strength;UE functional use    Rehab Potential Good    Clinical Decision Making Multiple treatment options, significant modification of task necessary    Comorbidities Affecting Occupational Performance: Presence of comorbidities impacting occupational performance    Modification or Assistance to Complete Evaluation  Max significant modification of tasks or assist is necessary to complete    OT Frequency 2x / week    OT Duration 12 weeks    OT Treatment/Interventions Self-care/ADL training;Therapeutic exercise;Neuromuscular education;Visual/perceptual remediation/compensation;Patient/family education;Therapeutic activities;DME and/or AE instruction;Passive range of motion;Moist Heat;Electrical Stimulation    Consulted and Agree with Plan of Care Patient              Patient will benefit from skilled therapeutic intervention in order to improve the following deficits and impairments:   Body Structure / Function / Physical Skills: ADL, FMC, ROM, Dexterity, IADL, Endurance, Obesity, Strength, UE functional use       Visit Diagnosis: Muscle weakness (generalized)    Problem List Patient Active Problem List   Diagnosis Date Noted   Sepsis due to vancomycin resistant Enterococcus species (HCC) 06/06/2019   SIRS (systemic inflammatory response syndrome) (HCC) 06/05/2019   Acute lower UTI 06/05/2019   VRE (vancomycin-resistant Enterococci) infection 06/05/2019   Anemia 06/05/2019   Skin ulcer of sacrum with necrosis of muscle (HCC)    Urinary retention    Type 2 diabetes mellitus without complication, with long-term current use of insulin (HCC)    Tachycardia    Lower extremity edema    Acute metabolic encephalopathy    Obstructive sleep apnea    Morbid obesity with BMI of 60.0-69.9, adult (HCC)    Goals of care, counseling/discussion    Palliative care encounter    Sepsis (HCC) 04/27/2019   H/O insulin dependent diabetes mellitus 04/27/2019   History of CVA with residual deficit 04/27/2019   Seizure disorder (HCC) 04/27/2019   Decubitus ulcer of sacral region, stage 4 (HCC) 04/27/2019   Olegario MessierElaine Reva Pinkley, MS, OTR/L   Olegario MessierElaine Jalen Oberry, OT 06/14/2021, 2:26 PM  [facility] [address] [place], [place], [address] Phone: [phone]   Fax:  [phone]  Name: Clint BolderSamuel Hakeem Bir MRN: [id number] Date of Birth: 06/16/1995

## 2021-06-16 ENCOUNTER — Ambulatory Visit: Payer: BC Managed Care – PPO | Admitting: Occupational Therapy

## 2021-06-16 ENCOUNTER — Ambulatory Visit: Payer: BC Managed Care – PPO

## 2021-06-16 ENCOUNTER — Other Ambulatory Visit: Payer: Self-pay

## 2021-06-16 ENCOUNTER — Encounter: Payer: Self-pay | Admitting: Occupational Therapy

## 2021-06-16 DIAGNOSIS — R2681 Unsteadiness on feet: Secondary | ICD-10-CM

## 2021-06-16 DIAGNOSIS — R269 Unspecified abnormalities of gait and mobility: Secondary | ICD-10-CM | POA: Diagnosis not present

## 2021-06-16 DIAGNOSIS — I6930 Unspecified sequelae of cerebral infarction: Secondary | ICD-10-CM | POA: Diagnosis not present

## 2021-06-16 DIAGNOSIS — R2689 Other abnormalities of gait and mobility: Secondary | ICD-10-CM | POA: Diagnosis not present

## 2021-06-16 DIAGNOSIS — R262 Difficulty in walking, not elsewhere classified: Secondary | ICD-10-CM | POA: Diagnosis not present

## 2021-06-16 DIAGNOSIS — M6281 Muscle weakness (generalized): Secondary | ICD-10-CM | POA: Diagnosis not present

## 2021-06-16 DIAGNOSIS — R278 Other lack of coordination: Secondary | ICD-10-CM

## 2021-06-16 NOTE — Therapy (Signed)
[place] ?[place] [facility] ?[place], [place], [address] ?Phone: [phone]   Fax:  [phone] ? ?Physical Therapy Treatment ? ?Patient Details  ?Name: Paul Hall ?MRN: [id number] ?Date of Birth: 12/30/1995 ?Referring Provider (PT): Collene Mares, PA-C ? ? ?Encounter Date: 06/16/2021 ? ? PT End of Session - 06/16/21 1348   ? ? Visit Number 28   ? Number of Visits 37   ? Date for PT Re-Evaluation 06/16/21   ? Authorization Type [employer] and [employer]- Need auth past 12th visit   ? Authorization Time Period 01/04/21-03/29/21; Recert [id number]   ? Progress Note Due on Visit 30   ? PT Start Time 1350   ? PT Stop Time 1428   ? PT Time Calculation (min) 38 min   ? Equipment Utilized During Treatment --   [employer] lift  ? Activity Tolerance Patient tolerated treatment well   queasiness/nausea  ? Behavior During Therapy [facility] for tasks assessed/performed   ? ?  ?  ? ?  ? ? ?Past Medical History:  ?Diagnosis Date  ? Diabetes mellitus ([place])   ? Hypertension   ? Stroke [facility])   ? ? ?Past Surgical History:  ?Procedure Laterality Date  ? IVC FILTER PLACEMENT ([facility] HX)    ? LEG SURGERY    ? PEG TUBE PLACEMENT    ? TRACHEOSTOMY    ? ? ?There were no vitals filed for this visit. ? ? Subjective Assessment - 06/16/21 1346   ? ? Subjective Patient reports doing well overall and no new complaints. Denies any pain today.   ? Patient is accompained by: Family member   ? Pertinent History Paul Hall is a 60yoM who presents with severe weakness, quadriparesis, altered sensorium, and visual impairment s/p critical illness and prolonged hospitalization. Pt hospitalized in October 2020 with ARDS 2/2 COVID19 infection. Pt sustained a complex and lengthy hospitalization which included tracheostomy, prolonged sedation, ECMO. In this period pt sustained CVA and SDH. Pt has now been liberated from tracheostomy and G-tube. Pt has since been hospitalized for wound  infection and UTI. Pt lives with parents at home, has hospital bed and left chair, hoyer lift transfers, and power WC for mobility needs. Pt needs heavy physical assistance with ADL 2/2 BUE contractures and motor dysfunction.   ? Limitations Lifting;Standing;Walking;Writing;Reading;Sitting;House hold activities   ? How long can you sit comfortably? Patient reports uable to sit up long < 4 hours due to buttock discomfort   ? How long can you stand comfortably? Patient is currently unable to stand   ? How long can you walk comfortably? Patient is currently unable to walk   ? Diagnostic tests No recent relavent diagnostic tests   ? Patient Stated Goals I want to be able to possibly walk again   ? Currently in Pain? No/denies   ? ?  ?  ? ?  ? ? ?INTERVENTIONS:  ? ?Manual stretch to bilateral Hamstrings and B Hip abd - hold 20 sec x 4 reps.  ? ?Knee ext B LE- 3 sec hold and 3 sec eccentric control back to rest x 20 reps today ?Gluteal sets- hold 5 sec x 20 reps ?Hip add (Manual resistance) x 5 sec hold x 20 reps ?Hip abd resistive (manual) x 20 reps each LE ?Abdominal crunch forward - x 20 reps ?Manually resisted lumbar ext - hold 5 sec x 20 reps ?Cross punch (trunk twist) x 20 reps each LE ?Assisted DF x 20reps ?  Hip march (AAROM) x 20 reps BLE  ?Static sit in upright position without back support x 8 min while performing several above exercises.  ?  ? ? ?Education provided throughout session via VC/TC and demonstration to facilitate movement at target joints and correct muscle activation for all testing and exercises performed.  ? ? ? ? ? ? ? ? ? ? ? ? ? ? ? ? ? ? ? ? ? ? ? ? PT Education - 06/16/21 1348   ? ? Education Details Exercise technique   ? Person(s) Educated Patient   ? Methods Explanation;Demonstration;Tactile cues;Verbal cues   ? Comprehension Verbalized understanding;Returned demonstration;Need further instruction;Tactile cues required;Verbal cues required   ? ?  ?  ? ?  ? ? ? PT Short Term Goals -  05/10/21 1413   ? ?  ? PT SHORT TERM GOAL #1  ? Title Pt will be independent with HEP in order to improve strength and balance in order to decrease fall risk and improve function at home and work.   ? Baseline 01/04/2021= No formal HEP in place; 12/12 no HEP in place; 05/10/2021-Patient and his father were able to report compliance with curent HEP consisting of mostly seated/reclined LE strengthening. Both verbalize no questions at this time.   ? Time 6   ? Period Weeks   ? Status Achieved   ? Target Date 02/15/21   ? ?  ?  ? ?  ? ? ? ? PT Long Term Goals - 05/10/21 1414   ? ?  ? PT LONG TERM GOAL #1  ? Title Patient will increase BLE gross strength by 1/2 muscle grade to improve functional strength for improved independence with potential gait, increased standing tolerance and increased ADL ability.   ? Baseline 01/04/2021- Patient presents with 1/5 to 3-/5 B LE strength with MMT; 12/12: goal partially met for Left knee/hip; 05/10/2021= 2-/5 bilateal Hip flex; 3+/5 bilateral Knee ext   ? Time 12   ? Period Weeks   ? Status Partially Met   ? Target Date 03/29/21   ?  ? PT LONG TERM GOAL #2  ? Title Patient will tolerate sitting unsupported demonstrating erect sitting posture for 15 minutes with CGA to demonstrate improved back extensor strength and improved sitting tolerance.   ? Baseline 01/04/2021- Patient confied to sitting in lift chair or electric power chair with back support and unable to sit upright without physical assistance; 12/12: tolerates <1 minutes upright unsupported sitting. 05/10/2021=static sit with forward trunk lean  in his power wheelchair without back support x approx 3 min.   ? Time 12   ? Period Weeks   ? Status On-going   ? Target Date 03/29/21   ?  ? PT LONG TERM GOAL #3  ? Title Patient will demonstrate ability to perform static standing in // bars > 2 min with Max Assist  without loss of balance and fair posture for improved overall strength for pre-gait and transfer activities.   ? Baseline  01/04/2021= Patient current uanble to stand- Dependent on hoyer or sit to stand lift for transfers. 05/10/2021=Not appropriate yet- Currently still dependent with all transfers using hoyer.   ? Time 12   ? Period Weeks   ? Status On-going   ? Target Date 03/29/21   ?  ? PT LONG TERM GOAL #4  ? Title Pt will improve FOTO score by 10 points or more demonstrating improved perceived functional ability   ? Baseline FOTO 7 on  10/17; 03/15/21: FOTO 12; 05/10/2021   ? Time 12   ? Period Weeks   ? Status On-going   ? Target Date 03/29/21   ? ?  ?  ? ?  ? ? ? ? ? ? ? ? Plan - 06/16/21 1349   ? ? Clinical Impression Statement Patient presented with good motivation and continues to focus on necessary LE strength to stand. He was able to successfully increase his reps prior to fatigue today with significant difficulty. Pt will continue to benefit from skilled PT intervention to improve his overall function, weight shifting and postiion change ability, LE strength, trunk strength and improve his overall quality of life   ? Personal Factors and Comorbidities Comorbidity 3+;Time since onset of injury/illness/exacerbation   ? Comorbidities CVA, diabetes, Seizures   ? Examination-Activity Limitations Bathing;Bed Mobility;Bend;Caring for Others;Carry;Dressing;Hygiene/Grooming;Lift;Locomotion Level;Reach Overhead;Self Feeding;Sit;Squat;Stairs;Stand;Transfers;Toileting   ? Examination-Participation Restrictions Cleaning;Community Activity;Driving;Laundry;Medication Management;Meal Prep;Occupation;Personal Finances;Shop;Yard Work;Volunteer   ? Stability/Clinical Decision Making Evolving/Moderate complexity   ? Rehab Potential Fair   ? PT Frequency 2x / week   ? PT Duration 12 weeks   ? PT Treatment/Interventions ADLs/Self Care Home Management;Cryotherapy;Electrical Stimulation;Moist Heat;Ultrasound;DME Instruction;Gait training;Stair training;Functional mobility training;Therapeutic exercise;Balance training;Patient/family education;Orthotic  Fit/Training;Neuromuscular re-education;Wheelchair mobility training;Manual techniques;Passive range of motion;Dry needling;Energy conservation;Taping;Visual/perceptual remediation/compensation;Joint Manipulations

## 2021-06-16 NOTE — Therapy (Signed)
[place] ?[facility] ?[address] ?[place], [place], [address] ?Phone: [phone]   Fax:  [phone] ? ?Occupational Therapy Treatment/Recertification ? ?Patient Details  ?Name: Paul Hall ?MRN: [id number] ?Date of Birth: [birth year] ?Referring Provider (OT): Lenise Herald ? ? ?Encounter Date: 06/16/2021 ? ? OT End of Session - 06/16/21 1358   ? ? Visit Number 38   ? Number of Visits 72   ? Date for OT Re-Evaluation 09/08/21   ? OT Start Time 1307   ? OT Stop Time 1345   ? OT Time Calculation (min) 38 min   ? Equipment Utilized During Treatment tilt in space power wc   ? Activity Tolerance Patient tolerated treatment well   ? Behavior During Therapy [facility] for tasks assessed/performed   ? ?  ?  ? ?  ? ? ?Past Medical History:  ?Diagnosis Date  ? Diabetes mellitus ([facility])   ? Hypertension   ? Stroke [facility])   ? ? ?Past Surgical History:  ?Procedure Laterality Date  ? IVC FILTER PLACEMENT ([facility] HX)    ? LEG SURGERY    ? PEG TUBE PLACEMENT    ? TRACHEOSTOMY    ? ? ?There were no vitals filed for this visit. ? ? Subjective Assessment - 06/16/21 1356   ? ? Subjective  Pt. reports doing well today   ? Patient is accompanied by: Family member   ? Pertinent History Pt. is a 26 y.o. male who was hospitalized, and diagnosed with COVID-19, sustained a CVA with resultant quadreplegia in 01/2019. Pt. was hospitalized with VRE  UTI. Pt. PMHx includes: urinary retention, Seizure Disorder, Obstructive Sleep Disorder, DM Type II, and Morbid obesity. Pt. resides at home with his parents.   ? Currently in Pain? No/denies   ? ?  ?  ? ?  ? ?Pt. has made progress over this past recertification period. Pt. is now able to use his left hand to engage in self-feeding finger foods, and is now able to hold a water bottle with his left hand while drinking from it. Pt. requires minA to initiate threading his sleeves into his jacket, requires MaxA to donn it over the upper arm, and maxAx2 to  bring it around his back. Pt. was scheduled to have Botox injections in his Bilateral UEs secondary to flexor tone and tightness, however due to transportation issues the appointment had to be rescheduled for next month. Pt. will need additional OT services at that time for ROM, and facilitation of active volitional movement in order to work towards engaging his BUEs in daily ADLs, and IADL tasks.  ? ? ? ? ? ? ? ? ? ? ? ? ? ? ? ? ? ? ? ? ? OT Education - 06/16/21 1358   ? ? Education Details BUE AROM   ? Person(s) Educated Patient   ? Methods Verbal cues;Tactile cues   ? Comprehension Verbalized understanding;Verbal cues required;Need further instruction;Returned demonstration   ? ?  ?  ? ?  ? ? ? ? ? ? OT Long Term Goals - 06/16/21 1401   ? ?  ? OT LONG TERM GOAL #1  ? Title Pt. will improve FOTO score by 5 points for clinically significant improvements during ADLs, and IADLs,   ? Baseline 30th visit: FOTO: 37, 21st: FOTO 43. 10th Visit: FOTO score 42 Eval: FOTO score: 41   ? Time 12   ? Period Weeks   ? Status Revised   ?  Target Date 09/08/21   ?  ? OT LONG TERM GOAL #2  ? Time 12   ?  ? OT LONG TERM GOAL #3  ? Title Pt. will improve right Abduction by 10 degrees to assist caregivers with underarm hygiene care.   ? Baseline Eval: AROM abduction RUE 76. Pt. continues to present with limited bilateral abduction. 21st: AROM abduction RUE 80.06/16/2021: right shulder abduction 108.   ? Time 12   ? Period Weeks   ? Status On-going   ? Target Date 09/08/21   ?  ? OT LONG TERM GOAL #4  ? Title Pt. will demonstrate visual compensatory strategies for navigating through his environment.   ? Baseline 30th visit: Pt. continues to require assist to navigate through his environment. 20t: pt continues to require assist to navigate environment, minimal knowledge of strategies. Eval: Limited   ? Time 12   ? Period Weeks   ? Status On-going   ? Target Date 09/08/21   ?  ? OT LONG TERM GOAL #5  ? Title Pt. will demonstrate visual  compensatory strategies for tabletop tasks within his near space.   ? Baseline 06/16/2021: Pt. continues to present with visual limitations for tabletop tasks in his near space. Pt. continues to present with limited vision. 21st: Pt can identify objects on tabletop ~waterbottle sized, improves with contrasting colours, cannot ID small onjects   ? Time 12   ? Period Weeks   ? Status On-going   ? Target Date 09/08/21   ?  ? OT LONG TERM GOAL #6  ? Title Pt. will perform hand to mouth patterns with supervision in preparation for self-feeding.   ? Baseline 30th visit: Pt. continues to present with weakness limiting functional hand to mouth patterns of movement. Pt. is able to perform hand to mouth patterns, however reports weakness in his hand  limits a secure grasp on the utensil. Pt. requires assist to perform. Eval: Pt. has difficulty.   ? Time 12   ? Period Weeks   ? Status Revised   ? Target Date 09/08/21   ?  ? OT LONG TERM GOAL #7  ? Title Pt. will perform one step self-grooming tasks with minA.   ? Baseline 06/16/2021: Pt. is able to perform the hand to face pattern with the left hand, however has difficulty actively sustaining his BUEs in elevation long enough to perform self-grooming tasks.   ? Time 12   ? Period Weeks   ? Status New   ? Target Date 09/08/21   ?  ? OT LONG TERM GOAL #8  ? Title Pt will complete UB dressing tasks with SETUP and assist to thread over hands only   ? Baseline 06/16/2021: Pt. has progressed  to require MinA to thread arms, and requires maxA to donn over upper arms, MaxAx2 to donn over his back.   ? Time 12   ? Period Weeks   ? Status On-going   ? Target Date 09/08/21   ?  ? OT LONG TERM GOAL  #9  ? TITLE Pt will improve B grip strength by 5# to assist in handling ADL items   ? Baseline 06/16/2021: MAxA to position the dynamometer in his hands. Increased flexor tone affecting his ability to  generate enough grip strength force to register on the dynamometer.   ? Time 12   ? Period Weeks    ? Status Deferred   ?  ? OT LONG TERM GOAL  #10  ?  TITLE Pt will improve B lateral pinch strength by 3# to assist in UBD.   ? Baseline N/A: awaiting pinch meter to be fixed. Pt. unable to formulate proper positioning for lateral pinch assessment of the replacement pinch meter.   ? Time 12   ? Period Weeks   ? Status Deferred   ? ?  ?  ? ?  ? ? ? ? ? ? ? ? Plan - 06/16/21 1359   ? ? Clinical Impression Statement Pt. has made progress over this past recertification period. Pt. is now able to use his left hand to engage in self-feeding finger foods, and is now able to hold a water bottle with his left hand while drinking from it. Pt. requires minA to initiate threading his sleeves into his jacket, requires MaxA to donn it over the upper arm, and maxAx2 to bring it around his back. Pt. was scheduled to have Botox injections in his Bilateral UEs secondary to flexor tone and tightness, however due to transportation issues the appointment had to be rescheduled for next month. Pt. will need additional OT services at that time for ROM, and facilitation of active volitional movement in order to work towards engaging his BUEs in daily ADLs, and IADL tasks.  ? ? ?  ? OT Occupational Profile and History Detailed Assessment- Review of Records and additional review of physical, cognitive, psychosocial history related to current functional performance   ? Occupational performance deficits (Please refer to evaluation for details): ADL's;IADL's   ? Body Structure / Function / Physical Skills ADL;FMC;ROM;Dexterity;IADL;Endurance;Obesity;Strength;UE functional use   ? Rehab Potential Good   ? Clinical Decision Making Multiple treatment options, significant modification of task necessary   ? Comorbidities Affecting Occupational Performance: Presence of comorbidities impacting occupational performance   ? Modification or Assistance to Complete Evaluation  Max significant modification of tasks or assist is necessary to complete   ? OT  Frequency 2x / week   ? OT Duration 12 weeks   ? OT Treatment/Interventions Self-care/ADL training;Therapeutic exercise;Neuromuscular education;Visual/perceptual remediation/compensation;Patient/family edu

## 2021-06-18 IMAGING — CT CT ABD-PELV W/ CM
2 of 4 series · 14 of 46 positions shown, 16 images · IV contrast (APPLIED)
Comparison: None.

CLINICAL DATA: Acute abdominal pain. Evaluate for abscess.
Bed-bound.

EXAM:
CT ABDOMEN AND PELVIS WITH CONTRAST
TECHNIQUE: Multidetector CT imaging of the abdomen and pelvis was performed
using the standard protocol following bolus administration of
intravenous contrast.
CONTRAST:  125mL OMNIPAQUE IOHEXOL 300 MG/ML  SOLN

[Series 2: axial st · axial · 0.98mm/px · z∈[-1579,-1029]mm · 11 of 128 slices shown, 13 images]
[im 12/128  soft-tissue]
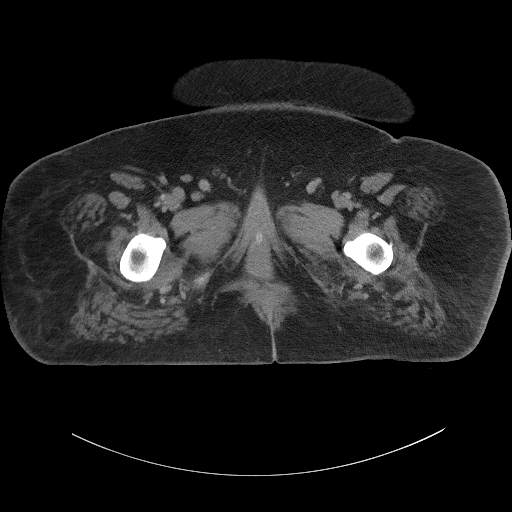
[im 12/128  bone]
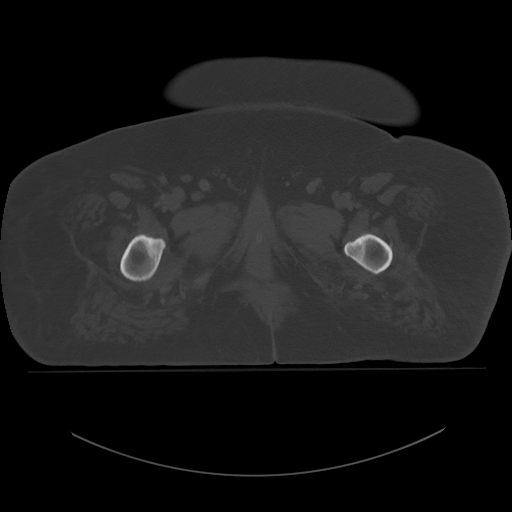
[im 23/128  soft-tissue]
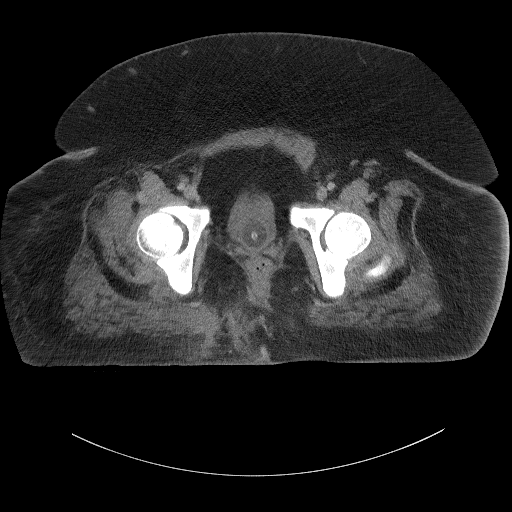
[im 34/128  soft-tissue]
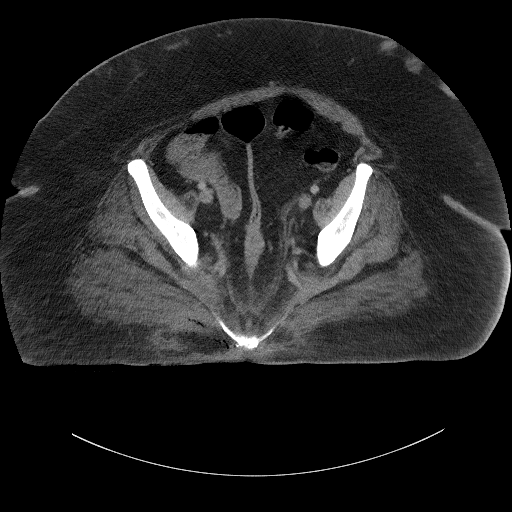
[im 45/128  soft-tissue]
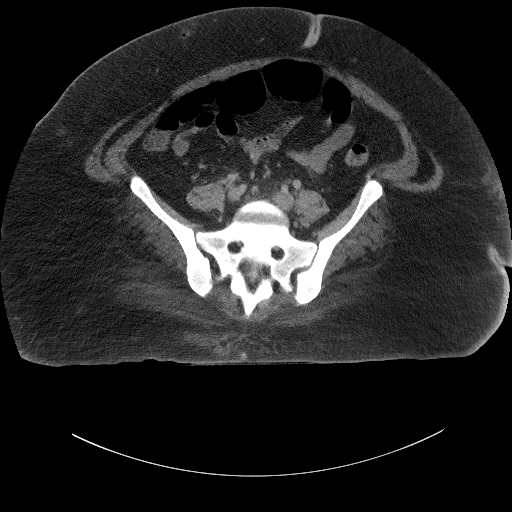
[im 56/128  soft-tissue]
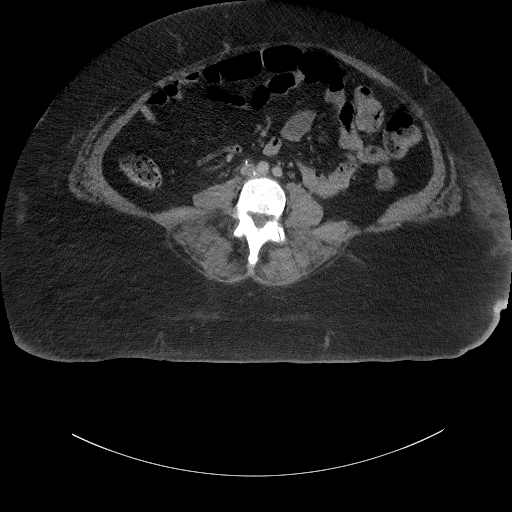
[im 67/128  soft-tissue]
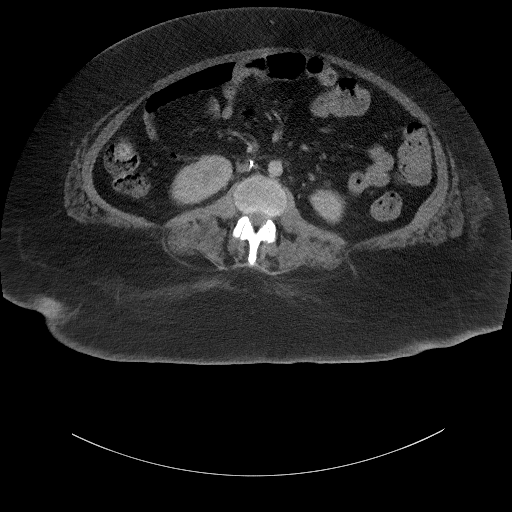
[im 78/128  soft-tissue]
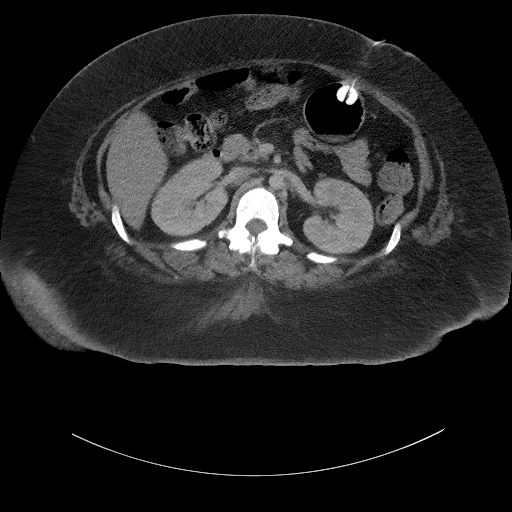
[im 89/128  soft-tissue]
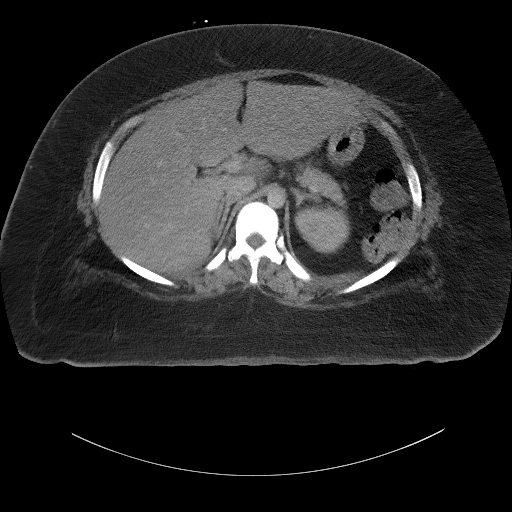
[im 100/128  soft-tissue]
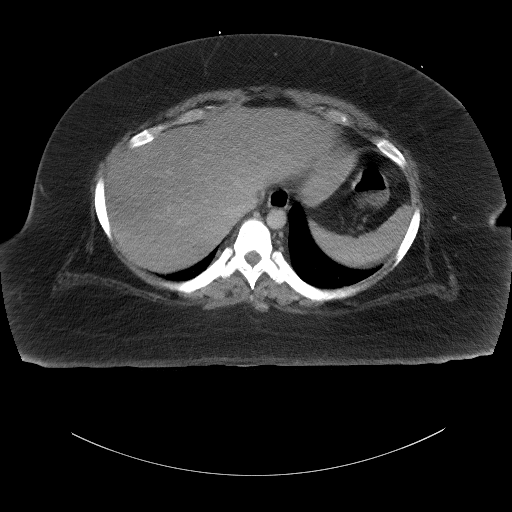
[im 100/128  bone]
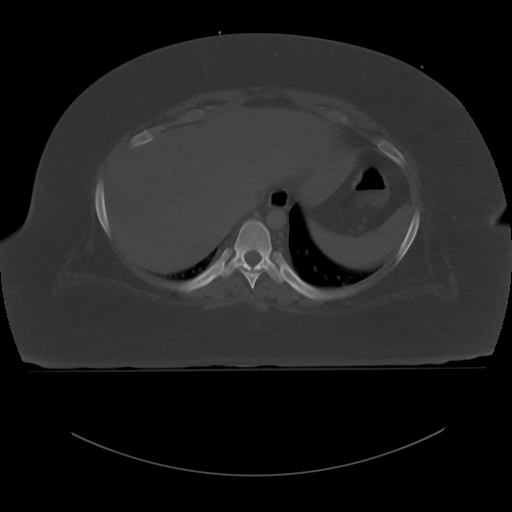
[im 111/128  soft-tissue]
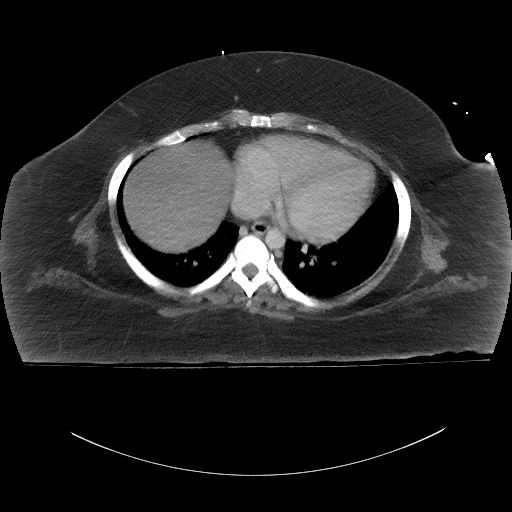
[im 122/128  soft-tissue]
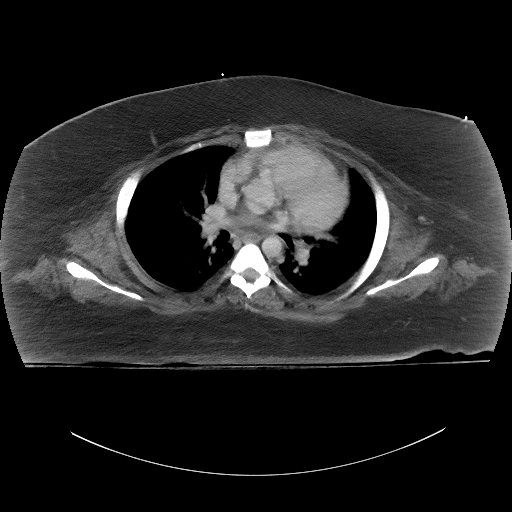

[Series 5: coronal st · coronal · 0.86mm/px · 3 of 116 slices shown]
[im 39/116  soft-tissue]
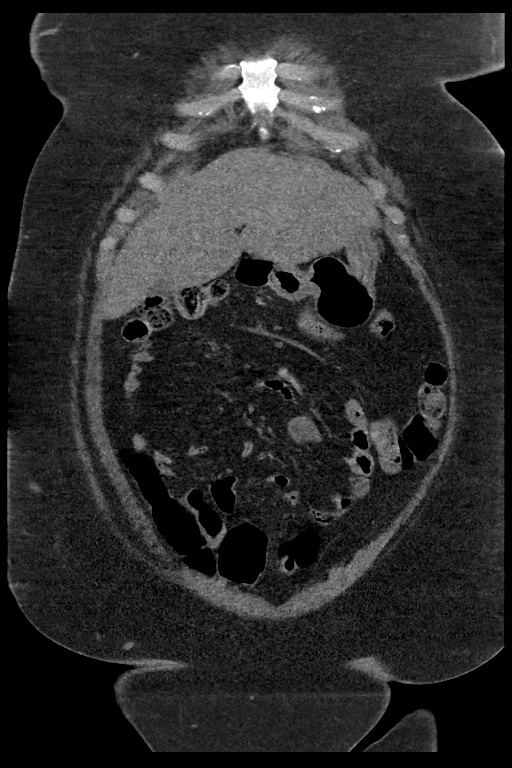
[im 52/116  soft-tissue]
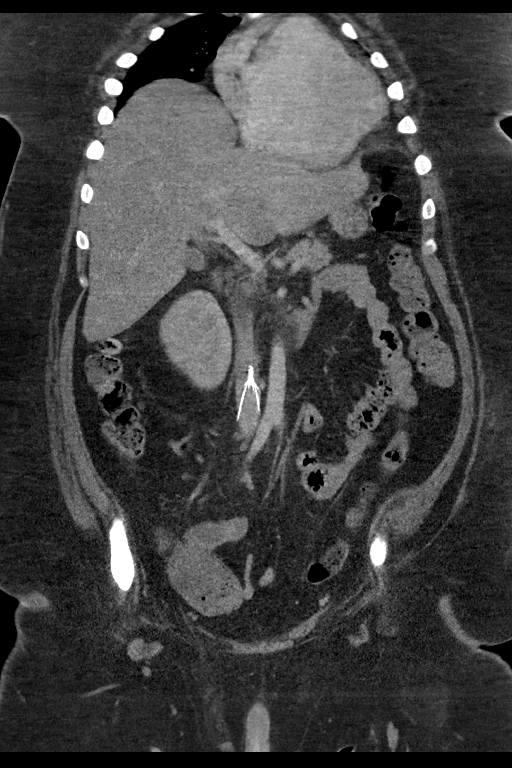
[im 64/116  soft-tissue]
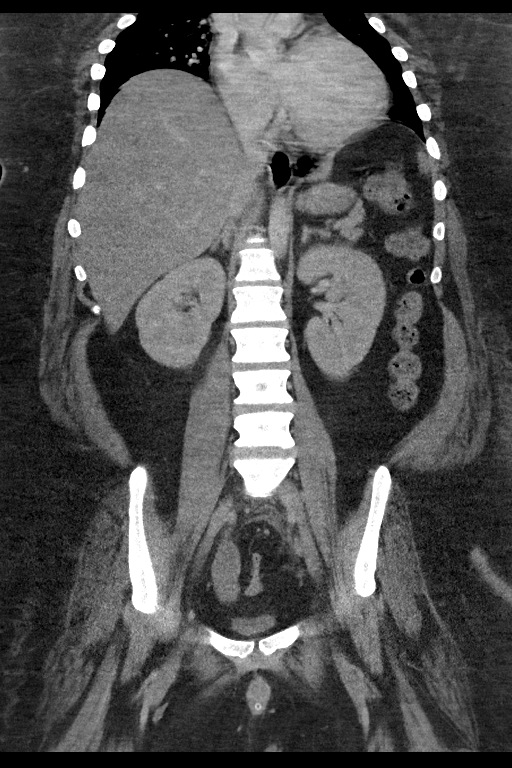

[14 of 46 positions shown; findings below may reference images not displayed]

FINDINGS: Lower chest: Lung bases are clear.

Hepatobiliary: Liver is diffusely low in density indicating fatty
infiltration. Hypodense masslike lesion involving the caudate lobe
of the liver, difficult to definitively characterize due to patient
body habitus.

Gallbladder appears normal. No bile duct dilatation seen.

Pancreas: Unremarkable. No pancreatic ductal dilatation or
surrounding inflammatory changes.

Spleen: Normal in size without focal abnormality.

Adrenals/Urinary Tract: Adrenal glands appear normal. Kidneys are
unremarkable without mass, stone or hydronephrosis. Bladder is
decompressed by Foley catheter.

Stomach/Bowel: No dilated large or small bowel loops. No evidence of
bowel wall inflammation seen.

Vascular/Lymphatic: No acute appearing vascular abnormality. No
enlarged lymph nodes seen. IVC filter in place with tip below the
level of the renal veins.

Reproductive: Prostate is unremarkable.

Other: Fluid stranding within the lower pelvis. No intraperitoneal
fluid collection or abscess collection identified. No free
intraperitoneal air.

Gastrostomy tube appears appropriately position.

Musculoskeletal: Poorly defined collection of fluid/inflammation and
gas within the superficial soft tissues overlying the lower sacrum
(just to the RIGHT of midline, measuring 5 x 2 cm (series 2, image
88). This inflammation and soft tissue gas extends to the overlying
skin in the midline, compatible with skin/wound breakdown.
Associated soft tissue gas and soft tissue inflammation extends
inferiorly below the level of the coccyx into the level of the
medial aspects of the RIGHT gluteus musculature (series 2, image
105). No evidence of intraperitoneal extension.

Underlying osseous structures appear intact and normal in
mineralization. No evidence of underlying osteomyelitis.
IMPRESSION: 1. Poorly defined collection of fluid/inflammation and soft tissue
gas within the superficial soft tissues overlying the lower sacrum
(just to the RIGHT of midline), measuring 5 x 2 cm (series 2, image
88). No definite drainable abscess collection. This ill-defined soft
tissue inflammation and soft tissue gas extends to the overlying
skin in the midline at the level of the mid sacrum, compatible with
skin/wound breakdown. Associated soft tissue gas and soft tissue
inflammation extends inferiorly to the coccyx and to the level of
the medial aspects of the RIGHT gluteus musculature (series 2, image
105). No evidence of intraperitoneal extension. No evidence of
underlying osteomyelitis.
2. Fatty infiltration of the liver.
3. **An incidental finding of potential clinical significance has
been found. Hypodense masslike lesion involving the caudate lobe of
the liver, difficult to definitively characterize due to patient
body habitus. Recommend nonemergent liver MRI for further
characterization. When the patient is clinically stable and able to
follow directions and hold their breath (preferably as an
outpatient) further evaluation with dedicated abdominal MRI should
be considered.**

## 2021-06-21 ENCOUNTER — Encounter: Payer: Self-pay | Admitting: Occupational Therapy

## 2021-06-21 ENCOUNTER — Ambulatory Visit: Payer: BC Managed Care – PPO | Admitting: Occupational Therapy

## 2021-06-21 ENCOUNTER — Ambulatory Visit: Payer: BC Managed Care – PPO

## 2021-06-21 ENCOUNTER — Other Ambulatory Visit: Payer: Self-pay

## 2021-06-21 DIAGNOSIS — R278 Other lack of coordination: Secondary | ICD-10-CM | POA: Diagnosis not present

## 2021-06-21 DIAGNOSIS — M6281 Muscle weakness (generalized): Secondary | ICD-10-CM | POA: Diagnosis not present

## 2021-06-21 DIAGNOSIS — R269 Unspecified abnormalities of gait and mobility: Secondary | ICD-10-CM | POA: Diagnosis not present

## 2021-06-21 DIAGNOSIS — I6930 Unspecified sequelae of cerebral infarction: Secondary | ICD-10-CM | POA: Diagnosis not present

## 2021-06-21 DIAGNOSIS — R262 Difficulty in walking, not elsewhere classified: Secondary | ICD-10-CM | POA: Diagnosis not present

## 2021-06-21 DIAGNOSIS — R2681 Unsteadiness on feet: Secondary | ICD-10-CM

## 2021-06-21 DIAGNOSIS — R2689 Other abnormalities of gait and mobility: Secondary | ICD-10-CM | POA: Diagnosis not present

## 2021-06-21 NOTE — Therapy (Addendum)
[place] ?[facility] ?[place], [place], [address] ?Phone: [phone]   Fax:  [phone] ? ?Physical Therapy Treatment/Recertification 09/25/7626-06/17/1759 ? ?Patient Details  ?Name: Paul Hall ?MRN: [id number] ?Date of Birth: 10-06-1995 ?Referring Provider (PT): Collene Mares, PA-C ? ? ?Encounter Date: 06/21/2021 ? ? ? ? ?Past Medical History:  ?Diagnosis Date  ? Diabetes mellitus ([place])   ? Hypertension   ? Stroke [facility])   ? ? ?Past Surgical History:  ?Procedure Laterality Date  ? IVC FILTER PLACEMENT ([facility] HX)    ? LEG SURGERY    ? PEG TUBE PLACEMENT    ? TRACHEOSTOMY    ? ? ?There were no vitals filed for this visit. ? ? ? ? ? ? ? ? ? ?INTERVENTIONS:  ? ? ?*Defered any core/back/UE activity due to acute back pain today.  ? ?*added Moist heat to back region today while performing the following LE strengthening exercises. ? ?Manual Therapy:  ?Manual stretch to bilateral Hamstrings and B Hip abd - hold 20 sec x 4 reps.  ? ?Therapeutic Exercises:  ?  ?Knee ext B LE- 3 sec hold and 3 sec eccentric control back to rest x 20 reps today ?Gluteal sets- hold 5 sec x 20 reps ?Hip add (Manual resistance) x 5 sec hold x 20 reps ?Hip abd resistive (manual) x 20 reps each LE ?Hip ext (manual Resistance) Raising LE up into march then resist hip ext back down- x 20 reps ?Assisted DF on left x 20 reps:  ?Hip march (AAROM) x 20 reps BLE ?Hamstring curls using RTB x 20 reps each LE.  ? ?Education provided throughout session via VC/TC and demonstration to facilitate movement at target joints and correct muscle activation for all testing and exercises performed.  ? ?Reassessed LTG's ? ?MMT- Patient presents with 2-/5 bilateral Hip flex; 3+/5 bilateral knee ext/flex; 2-/5 left ankle DF; 0/5 right ankle- and able to increase reps and resistance with LE's ? ?Siting unsupported for 15 min goal- Unable to assess today due to patient with acute back pain but on previous  visit able to sit x 8 min without back support.  ? ?FOTO= 1  ? ? ?   ? ? ? ? ? ? ? ? ? ? ? ? ? ? ? PT Short Term Goals - 05/10/21 1413   ? ?  ? PT SHORT TERM GOAL #1  ? Title Pt will be independent with HEP in order to improve strength and balance in order to decrease fall risk and improve function at home and work.   ? Baseline 01/04/2021= No formal HEP in place; 12/12 no HEP in place; 05/10/2021-Patient and his father were able to report compliance with curent HEP consisting of mostly seated/reclined LE strengthening. Both verbalize no questions at this time.   ? Time 6   ? Period Weeks   ? Status Achieved   ? Target Date 02/15/21   ? ?  ?  ? ?  ? ? ? ? PT Long Term Goals - 06/21/21 0848   ? ?  ? PT LONG TERM GOAL #1  ? Title Patient will increase BLE gross strength by 1/2 muscle grade to improve functional strength for improved independence with potential gait, increased standing tolerance and increased ADL ability.   ? Baseline 01/04/2021- Patient presents with 1/5 to 3-/5 B LE strength with MMT; 12/12: goal partially met for Left knee/hip; 05/10/2021= 2-/5 bilateal Hip flex; 3+/5 bilateral Knee ext; 06/21/2021=  Patient presents with 2-/5 bilateral Hip flex; 3+/5 bilateral knee ext/flex; 2-/5 left ankle DF; 0/5 right ankle- and able to increase reps and resistance with LE's   ? Time 12   ? Period Weeks   ? Status Partially Met   ? Target Date 03/29/21   ?  ? PT LONG TERM GOAL #2  ? Title Patient will tolerate sitting unsupported demonstrating erect sitting posture for 15 minutes with CGA to demonstrate improved back extensor strength and improved sitting tolerance.   ? Baseline 01/04/2021- Patient confied to sitting in lift chair or electric power chair with back support and unable to sit upright without physical assistance; 12/12: tolerates <1 minutes upright unsupported sitting. 05/10/2021=static sit with forward trunk lean  in his power wheelchair without back support x approx 3 min. 06/21/2021=Unable to assess today  due to patient with acute back pain but on previous visit able to sit x 8 min without back support.   ? Time 12   ? Period Weeks   ? Status On-going   ? Target Date 09/13/21   ?  ? PT LONG TERM GOAL #3  ? Title Patient will demonstrate ability to perform static standing in // bars > 2 min with Max Assist  without loss of balance and fair posture for improved overall strength for pre-gait and transfer activities.   ? Baseline 01/04/2021= Patient current uanble to stand- Dependent on hoyer or sit to stand lift for transfers. 05/10/2021=Not appropriate yet- Currently still dependent with all transfers using hoyer. 06/21/2021= Patient continuing now to focus on LE strengthening to prepare for standing-unable to try today due to acute low back pain-  planning on attempting in new cert period.   ? Time 12   ? Period Weeks   ? Status On-going   ? Target Date 09/13/21   ?  ? PT LONG TERM GOAL #4  ? Title Pt will improve FOTO score by 10 points or more demonstrating improved perceived functional ability   ? Baseline FOTO 7 on 10/17; 03/15/21: FOTO 12; 05/10/2021 06/21/2021= 1   ? Time 12   ? Period Weeks   ? Status On-going   ? Target Date 03/29/21   ? ?  ?  ? ?  ? ? ? ? ? ? ? ? ? ? ?Patient will benefit from skilled therapeutic intervention in order to improve the following deficits and impairments:  Abnormal gait, Decreased activity tolerance, Decreased balance, Decreased cognition, Cardiopulmonary status limiting activity, Decreased coordination, Decreased endurance, Decreased knowledge of use of DME, Decreased mobility, Decreased range of motion, Decreased safety awareness, Decreased strength, Difficulty walking, Hypomobility, Impaired perceived functional ability, Impaired flexibility, Impaired UE functional use, Impaired vision/preception, Improper body mechanics, Postural dysfunction, Obesity ? ?Visit Diagnosis: ?Muscle weakness (generalized) ? ?Difficulty in walking, not elsewhere classified ? ?Other lack of  coordination ? ?Unsteadiness on feet ? ?Abnormality of gait and mobility ? ?History of CVA with residual deficit ? ? ? ? ?Problem List ?Patient Active Problem List  ? Diagnosis Date Noted  ? Sepsis due to vancomycin resistant Enterococcus species ([place]) 06/06/2019  ? SIRS (systemic inflammatory response syndrome) ([place]) 06/05/2019  ? Acute lower UTI 06/05/2019  ? VRE (vancomycin-resistant Enterococci) infection 06/05/2019  ? Anemia 06/05/2019  ? Skin ulcer of sacrum with necrosis of muscle ([place])   ? Urinary retention   ? Type 2 diabetes mellitus without complication, with long-term current use of insulin ([place])   ? Tachycardia   ? Lower extremity edema   ?  Acute metabolic encephalopathy   ? Obstructive sleep apnea   ? Morbid obesity with BMI of 60.0-69.9, adult ([place])   ? Goals of care, counseling/discussion   ? Palliative care encounter   ? Sepsis ([place]) 04/27/2019  ? H/O insulin dependent diabetes mellitus 04/27/2019  ? History of CVA with residual deficit 04/27/2019  ? Seizure disorder ([place]) 04/27/2019  ? Decubitus ulcer of sacral region, stage 4 ([place]) 04/27/2019  ? ? ?Lewis Moccasin, PT ?06/24/2021, 9:00 AM ? ?[place] ?[facility] ?[place], [place], [address] ?Phone: [phone]   Fax:  [phone] ? ?Name: Paul Hall ?MRN: [id number] ?Date of Birth: Feb 27, 1996 ? ? ? ?

## 2021-06-21 NOTE — Therapy (Signed)
[place] ?[facility] ?[address] ?[place], [place], [address] ?Phone: [phone]   Fax:  [phone] ? ?Occupational Therapy Treatment ? ?Patient Details  ?Name: Paul BolderSamuel Hakeem Hall ?MRN: [id number] ?Date of Birth: Oct 27, 1995 ?Referring Provider (OT): Lenise HeraldBenjamin Mann ? ? ?Encounter Date: 06/21/2021 ? ? OT End of Session - 06/21/21 1419   ? ? Visit Number 39   ? Number of Visits 72   ? Date for OT Re-Evaluation 09/08/21   ? Authorization Type Progress report period starting 03/17/2021   ? OT Start Time 1310   ? OT Stop Time 1345   ? OT Time Calculation (min) 35 min   ? Equipment Utilized During Treatment tilt in space power wc   ? Activity Tolerance Patient tolerated treatment well   ? Behavior During Therapy [facility] for tasks assessed/performed   ? ?  ?  ? ?  ? ? ?Past Medical History:  ?Diagnosis Date  ? Diabetes mellitus (HCC)   ? Hypertension   ? Stroke [facility](HCC)   ? ? ?Past Surgical History:  ?Procedure Laterality Date  ? IVC FILTER PLACEMENT ([facility] HX)    ? LEG SURGERY    ? PEG TUBE PLACEMENT    ? TRACHEOSTOMY    ? ? ?There were no vitals filed for this visit. ? ? Subjective Assessment - 06/21/21 1418   ? ? Subjective  Pt. reports having had back discomfort over the weekend.   ? Patient is accompanied by: Family member   ? Pertinent History Pt. is a 26 y.o. male who was hospitalized, and diagnosed with COVID-19, sustained a CVA with resultant quadreplegia in 01/2019. Pt. was hospitalized with VRE  UTI. Pt. PMHx includes: urinary retention, Seizure Disorder, Obstructive Sleep Disorder, DM Type II, and Morbid obesity. Pt. resides at home with his parents.   ? Currently in Pain? Yes   ? Pain Score 8    ? Pain Location Finger (Comment which one)   4th digit PIP, DIP  ? Pain Orientation Right   ? Pain Descriptors / Indicators Discomfort   ? Pain Type Chronic pain   ? ?  ?  ? ?  ? ?OT TREATMENT   ?  ?Estim attended: ?  ?Pt. tolerated neuromuscular estim  for cycle time 10/10,   duty cycle 50%. Applied to the left wrist, and digit extensors for 3 min., and the right wrist, and digit extension for 5 min.  with the intensity set to 44, and constant monitoring 100% of the time. Manual assist was required for wrist and digit extension with each rep. To hold the position.  ?  ?Therapeutic Exercise: ?  ?Pt. tolerated bilateral wrist flexion, extension, and MP, PIP, and DIP flexion, and extension, however presented with 8/10 pain in with gentle ROM to the right 4th digit PIP. Patient tolerated moist heat modality for 5 min. to his bilateral hands prior to range of motion.  ?  ?Pt. was late for the session this afternoon. Pt. tolerated ROM well to the bilateral wrist, and digits. Pt. continues to tolerate Estim well to the bilateral wrist, and digit extensors with manual assist. Pt./caregiver education was provided about bilateral palm protectors. Pt. Was fit for both the left, and right, and was provided with the Bilateral palm protectors. Pt.  continues to work on normalizing tone in the bilateral upper extremities in order to improve range of motion, and facilitate active range of motion in order to increase engagement in ADLs and IADL tasks. ?  ?  ?  ?  ?  ? ? ? ? ? ? ? ? ? ? ? ? ? ? ? ? ? ? ? ? ?  OT Education - 06/21/21 1419   ? ? Education Details BUE AROM   ? Person(s) Educated Patient   ? Methods Verbal cues;Tactile cues   ? Comprehension Verbalized understanding;Verbal cues required;Need further instruction;Returned demonstration   ? ?  ?  ? ?  ? ? ? ? ? ? OT Long Term Goals - 06/16/21 1401   ? ?  ? OT LONG TERM GOAL #1  ? Title Pt. will improve FOTO score by 5 points for clinically significant improvements during ADLs, and IADLs,   ? Baseline 30th visit: FOTO: 37, 21st: FOTO 43. 10th Visit: FOTO score 42 Eval: FOTO score: 41   ? Time 12   ? Period Weeks   ? Status Revised   ? Target Date 09/08/21   ?  ? OT LONG TERM GOAL #2  ? Time 12   ?  ? OT LONG TERM GOAL #3  ? Title Pt. will improve  right Abduction by 10 degrees to assist caregivers with underarm hygiene care.   ? Baseline Eval: AROM abduction RUE 76. Pt. continues to present with limited bilateral abduction. 21st: AROM abduction RUE 80.06/16/2021: right shulder abduction 108.   ? Time 12   ? Period Weeks   ? Status On-going   ? Target Date 09/08/21   ?  ? OT LONG TERM GOAL #4  ? Title Pt. will demonstrate visual compensatory strategies for navigating through his environment.   ? Baseline 30th visit: Pt. continues to require assist to navigate through his environment. 20t: pt continues to require assist to navigate environment, minimal knowledge of strategies. Eval: Limited   ? Time 12   ? Period Weeks   ? Status On-going   ? Target Date 09/08/21   ?  ? OT LONG TERM GOAL #5  ? Title Pt. will demonstrate visual compensatory strategies for tabletop tasks within his near space.   ? Baseline 06/16/2021: Pt. continues to present with visual limitations for tabletop tasks in his near space. Pt. continues to present with limited vision. 21st: Pt can identify objects on tabletop ~waterbottle sized, improves with contrasting colours, cannot ID small onjects   ? Time 12   ? Period Weeks   ? Status On-going   ? Target Date 09/08/21   ?  ? OT LONG TERM GOAL #6  ? Title Pt. will perform hand to mouth patterns with supervision in preparation for self-feeding.   ? Baseline 30th visit: Pt. continues to present with weakness limiting functional hand to mouth patterns of movement. Pt. is able to perform hand to mouth patterns, however reports weakness in his hand  limits a secure grasp on the utensil. Pt. requires assist to perform. Eval: Pt. has difficulty.   ? Time 12   ? Period Weeks   ? Status Revised   ? Target Date 09/08/21   ?  ? OT LONG TERM GOAL #7  ? Title Pt. will perform one step self-grooming tasks with minA.   ? Baseline 06/16/2021: Pt. is able to perform the hand to face pattern with the left hand, however has difficulty actively sustaining his BUEs  in elevation long enough to perform self-grooming tasks.   ? Time 12   ? Period Weeks   ? Status New   ? Target Date 09/08/21   ?  ? OT LONG TERM GOAL #8  ? Title Pt will complete UB dressing tasks with SETUP and assist to thread over hands only   ? Baseline 06/16/2021: Pt. has progressed  to require MinA to thread arms, and requires maxA to donn over upper arms, MaxAx2 to donn over his back.   ? Time 12   ? Period Weeks   ? Status On-going   ? Target Date 09/08/21   ?  ? OT LONG TERM GOAL  #9  ? TITLE Pt will improve B grip strength by 5# to assist in handling ADL items   ? Baseline 06/16/2021: MAxA to position the dynamometer in his hands. Increased flexor tone affecting his ability to  generate enough grip strength force to register on the dynamometer.   ? Time 12   ? Period Weeks   ? Status Deferred   ?  ? OT LONG TERM GOAL  #10  ? TITLE Pt will improve B lateral pinch strength by 3# to assist in UBD.   ? Baseline N/A: awaiting pinch meter to be fixed. Pt. unable to formulate proper positioning for lateral pinch assessment of the replacement pinch meter.   ? Time 12   ? Period Weeks   ? Status Deferred   ? ?  ?  ? ?  ? ? ? ? ? ? ? ? Plan - 06/21/21 1427   ? ? Clinical Impression Statement Pt. was late for the session this afternoon. Pt. tolerated ROM well to the bilateral wrist, and digits. Pt. continues to tolerate Estim well to the bilateral wrist, and digit extensors with manual assist. Pt./caregiver education was provided about bilateral palm protectors. Pt. Was fit for both the left, and right, and was provided with the Bilateral palm protectors. Pt.  continues to work on normalizing tone in the bilateral upper extremities in order to improve range of motion, and facilitate active range of motion in order to increase engagement in ADLs and IADL tasks.  ? OT Occupational Profile and History Detailed Assessment- Review of Records and additional review of physical, cognitive, psychosocial history related to  current functional performance   ? Occupational performance deficits (Please refer to evaluation for details): ADL's;IADL's   ? Body Structure / Function / Physical Skills ADL;FMC;ROM;Dexterity;IADL;Endu

## 2021-06-23 ENCOUNTER — Ambulatory Visit: Payer: BC Managed Care – PPO | Admitting: Occupational Therapy

## 2021-06-23 ENCOUNTER — Ambulatory Visit: Payer: BC Managed Care – PPO

## 2021-06-23 ENCOUNTER — Encounter: Payer: Self-pay | Admitting: Occupational Therapy

## 2021-06-23 ENCOUNTER — Other Ambulatory Visit: Payer: Self-pay

## 2021-06-23 DIAGNOSIS — R278 Other lack of coordination: Secondary | ICD-10-CM | POA: Diagnosis not present

## 2021-06-23 DIAGNOSIS — R2689 Other abnormalities of gait and mobility: Secondary | ICD-10-CM | POA: Diagnosis not present

## 2021-06-23 DIAGNOSIS — I6930 Unspecified sequelae of cerebral infarction: Secondary | ICD-10-CM

## 2021-06-23 DIAGNOSIS — M6281 Muscle weakness (generalized): Secondary | ICD-10-CM

## 2021-06-23 DIAGNOSIS — R269 Unspecified abnormalities of gait and mobility: Secondary | ICD-10-CM | POA: Diagnosis not present

## 2021-06-23 DIAGNOSIS — R2681 Unsteadiness on feet: Secondary | ICD-10-CM | POA: Diagnosis not present

## 2021-06-23 DIAGNOSIS — R262 Difficulty in walking, not elsewhere classified: Secondary | ICD-10-CM | POA: Diagnosis not present

## 2021-06-23 NOTE — Therapy (Signed)
[place] ?[facility] ?[address] ?[place], [place], [address] ?Phone: [phone]   Fax:  [phone] ? ?Occupational Therapy Progress Note ? ?Dates of reporting period  05/10/2021   to   06/23/2021  ? ?Patient Details  ?Name: Paul Hall ?MRN: [id number] ?Date of Birth: 10-16-1995 ?Referring Provider (OT): Lenise Herald ? ? ?Encounter Date: 06/23/2021 ? ? OT End of Session - 06/23/21 1552   ? ? Visit Number 40   ? Number of Visits 72   ? Date for OT Re-Evaluation 09/08/21   ? Authorization Type Progress report period starting 05/10/2021  ? OT Start Time 1308   ? OT Stop Time 1347   ? OT Time Calculation (min) 39 min   ? Activity Tolerance Patient tolerated treatment well   ? Behavior During Therapy [facility] for tasks assessed/performed   ? ?  ?  ? ?  ? ? ?Past Medical History:  ?Diagnosis Date  ? Diabetes mellitus (HCC)   ? Hypertension   ? Stroke [facility])   ? ? ?Past Surgical History:  ?Procedure Laterality Date  ? IVC FILTER PLACEMENT ([facility] HX)    ? LEG SURGERY    ? PEG TUBE PLACEMENT    ? TRACHEOSTOMY    ? ? ?There were no vitals filed for this visit. ? ? Subjective Assessment - 06/23/21 1551   ? ? Subjective  Pt. reports having had back discomfort over the weekend.   ? Patient is accompanied by: Family member   ? Pertinent History Pt. is a 26 y.o. male who was hospitalized, and diagnosed with COVID-19, sustained a CVA with resultant quadreplegia in 01/2019. Pt. was hospitalized with VRE  UTI. Pt. PMHx includes: urinary retention, Seizure Disorder, Obstructive Sleep Disorder, DM Type II, and Morbid obesity. Pt. resides at home with his parents.   ? Patient Stated Goals To improve his arms, and hands.   ? Currently in Pain? Yes   ? Pain Score 5    ? Pain Location Finger (Comment which one)   ? Pain Orientation Right   ? Pain Descriptors / Indicators Aching   ? Pain Type Chronic pain   ? ?  ?  ? ?  ? ?OT TREATMENT   ?  ?Estim attended: ?  ?Pt. tolerated neuromuscular  estim  for cycle time 10/10,  duty cycle 50%. Applied to the left wrist, and digit extensors for 4 min., and the right wrist, and digit extension for 4 min.  with the intensity set to 44, and constant monitoring 100% of the time. Manual assist was required for wrist and digit extension with each rep. To hold the position.  ?  ?Therapeutic Exercise: ?  ?Pt. tolerated bilateral wrist flexion, extension, and MP, PIP, and DIP flexion, and extension, however presented with 8/10 pain in with gentle ROM to the right 4th digit PIP. Patient tolerated moist heat modality for 5 min. to his bilateral hands prior to range of motion.  ?  ?Goals were reviewed with the pt. Pt. has made progress over this past progress reporting period. Right shoulder abduction has improved by 2 degrees. Pt.'s FOTO score is 39. Pt. is able to use his left hand to engage in self-feeding finger foods, and is able to consistently hold a water bottle with his left hand while drinking from it. Pt. requires minA to initiate threading his sleeves into his jacket, requires MaxA to donn it over the upper arm, and maxAx2 to bring it around  his back. Pt. is scheduled for Botox treatment in May. Pt. continues to benefit from OT services  to improve ROM, and facilitate active volitional movement in order to work towards engaging his BUEs in daily ADLs, and IADL tasks.  ?  ? ? ? ? ? ? ? ? ? ? ? ? ? ? ? ? ? ? ? ? OT Education - 06/23/21 1552   ? ? Education Details BUE AROM   ? Person(s) Educated Patient   ? Comprehension Verbalized understanding;Verbal cues required;Need further instruction;Returned demonstration   ? ?  ?  ? ?  ? ? ? ? ? ? OT Long Term Goals - 06/23/21 1555   ? ?  ? OT LONG TERM GOAL #1  ? Title Pt. will improve FOTO score by 5 points for clinically significant improvements during ADLs, and IADLs,   ? Baseline 30th visit: FOTO: 37, 21st: FOTO 43. 10th Visit: FOTO score 42 Eval: FOTO score: 41 FOTO score: 39   ? Time 12   ? Period Weeks   ? Status  On-going   ? Target Date 09/08/21   ?  ? OT LONG TERM GOAL #3  ? Title Pt. will improve right Abduction by 10 degrees to assist caregivers with underarm hygiene care.   ? Baseline Eval: AROM abduction RUE 76. Pt. continues to present with limited bilateral abduction. 21st: AROM abduction RUE 80.06/16/2021: Right shulder abduction 108. 06/23/2021: Right shoulder abduction: 110   ? Time 12   ? Period Weeks   ? Status On-going   ? Target Date 09/08/21   ?  ? OT LONG TERM GOAL #4  ? Title Pt. will demonstrate visual compensatory strategies for navigating through his environment.   ? Baseline 30th visit: Pt. continues to require assist to navigate through his environment. 20th: Pt continues to require assist to navigate environment, minimal knowledge of strategies. Eval: Limited 06/23/2021: Pt. continues to be limited, and requires assist  with navigating through his environment.   ? Time 12   ? Period Weeks   ? Status On-going   ? Target Date 09/08/21   ?  ? OT LONG TERM GOAL #5  ? Title Pt. will demonstrate visual compensatory strategies for tabletop tasks within his near space.   ? Baseline 06/16/2021: Pt. continues to present with visual limitations for tabletop tasks in his near space. Pt. continues to present with limited vision. 21st: Pt can identify objects on tabletop ~waterbottle sized, improves with contrasting colours, cannot ID small onjects   ? Time 12   ? Period Weeks   ? Status On-going   ? Target Date 09/08/21   ?  ? OT LONG TERM GOAL #6  ? Title Pt. will perform hand to mouth patterns with supervision in preparation for self-feeding.   ? Baseline 06/23/2021: Pt. is able to use his bilateral hands to perfrom hand to mouth patterns for self-feeding with finger foods. Pt. is now able to use his left hand to bring a water bottle to his mouth,a nd drink from it. 30th visit: Pt. continues to present with weakness limiting functional hand to mouth patterns of movement. Pt. is able to perform hand to mouth patterns,  however reports weakness in his hand  limits a secure grasp on the utensil. Pt. requires assist to perform. Eval: Pt. has difficulty.   ? Time 12   ? Period Weeks   ? Target Date 09/08/21   ?  ? OT LONG TERM GOAL #7  ?  Title Pt. will perform one step self-grooming tasks with minA.   ? Baseline 06/23/2021: Pt. is able to perform the hand to face pattern with the left hand, however has difficulty actively sustaining his BUEs in elevation long enough to perform self-grooming tasks. 06/16/2021: Pt. is able to perform the hand to face pattern with the left hand, however has difficulty actively sustaining his BUEs in elevation long enough to perform self-grooming tasks.   ? Time 12   ? Period Weeks   ? Status On-going   ? Target Date 09/08/21   ?  ? OT LONG TERM GOAL #8  ? Title Pt will complete UB dressing tasks with SETUP and assist to thread over hands only   ? Baseline 06/23/2021: Pt. requires MinA to thread arms, and requires maxA to donn over upper arms, MaxAx2 to donn over his back. 06/16/2021: Pt. has progressed  to require MinA to thread arms, and requires maxA to donn over upper arms, MaxAx2 to donn over his back.   ? Time 12   ? Period Weeks   ? Status On-going   ? Target Date 09/08/21   ? ?  ?  ? ?  ? ? ? ? ? ? ? ? Plan - 06/23/21 1554   ? ? Clinical Impression Statement Goals were reviewed with the pt. Pt. has made progress over this past progress reporting period. Right shoulder abduction has improved by 2 degrees. Pt.'s FOTO score is 39. Pt. is able to use his left hand to engage in self-feeding finger foods, and is able to consistently hold a water bottle with his left hand while drinking from it. Pt. requires minA to initiate threading his sleeves into his jacket, requires MaxA to donn it over the upper arm, and maxAx2 to bring it around his back. Pt. is scheduled for Botox treatment in May. Pt. continues to benefit from OT services  to improve ROM, and facilitate active volitional movement in order to work  towards engaging his BUEs in daily ADLs, and IADL tasks.  ?   ? OT Occupational Profile and History Detailed Assessment- Review of Records and additional review of physical, cognitive, psychosocial

## 2021-06-24 DIAGNOSIS — G4733 Obstructive sleep apnea (adult) (pediatric): Secondary | ICD-10-CM | POA: Diagnosis not present

## 2021-06-24 NOTE — Addendum Note (Signed)
Addended by: Lenda Kelp on: 06/24/2021 09:01 AM ? ? Modules accepted: Orders ? ?

## 2021-06-24 NOTE — Therapy (Signed)
[place] ?[facility] ?[place], [place], [address] ?Phone: [phone]   Fax:  [phone] ? ?Physical Therapy Treatment/Physical Therapy Progress Note ? ? ?Dates of reporting period 05/10/2021  to   3/22/203 ? ?Patient Details  ?Name: Paul Hall ?MRN: [id number] ?Date of Birth: [birth year] ?Referring Provider (PT): Collene Mares, PA-C ? ? ?Encounter Date: 06/23/2021 ? ? PT End of Session - 06/23/21 7989   ? ? Visit Number 30   ? Number of Visits 37   ? Date for PT Re-Evaluation 09/13/21   ? Authorization Type [employer] and [employer]- Need auth past 12th visit   ? Authorization Time Period 01/04/21-03/29/21; Recert 21/[id number]   ? Progress Note Due on Visit 30   ? PT Start Time 1347   ? PT Stop Time 8563   ? PT Time Calculation (min) 42 min   ? Equipment Utilized During Treatment --   [employer] lift  ? Activity Tolerance Patient tolerated treatment well   queasiness/nausea  ? Behavior During Therapy [facility] for tasks assessed/performed   ? ?  ?  ? ?  ? ? ?Past Medical History:  ?Diagnosis Date  ? Diabetes mellitus ([place])   ? Hypertension   ? Stroke [facility])   ? ? ?Past Surgical History:  ?Procedure Laterality Date  ? IVC FILTER PLACEMENT ([facility] HX)    ? LEG SURGERY    ? PEG TUBE PLACEMENT    ? TRACHEOSTOMY    ? ? ?There were no vitals filed for this visit. ? ? Subjective Assessment - 06/23/21 1349   ? ? Subjective Patient reports back is still sore but some better than last visit.   ? Patient is accompained by: Family member   ? Pertinent History [name] is a 67yoM who presents with severe weakness, quadriparesis, altered sensorium, and visual impairment s/p critical illness and prolonged hospitalization. Pt hospitalized in October 2020 with ARDS 2/2 COVID19 infection. Pt sustained a complex and lengthy hospitalization which included tracheostomy, prolonged sedation, ECMO. In this period pt sustained CVA and SDH. Pt has now been liberated  from tracheostomy and G-tube. Pt has since been hospitalized for wound infection and UTI. Pt lives with parents at home, has hospital bed and left chair, hoyer lift transfers, and power WC for mobility needs. Pt needs heavy physical assistance with ADL 2/2 BUE contractures and motor dysfunction.   ? Limitations Lifting;Standing;Walking;Writing;Reading;Sitting;House hold activities   ? How long can you sit comfortably? Patient reports uable to sit up long < 4 hours due to buttock discomfort   ? How long can you stand comfortably? Patient is currently unable to stand   ? How long can you walk comfortably? Patient is currently unable to walk   ? Diagnostic tests No recent relavent diagnostic tests   ? Patient Stated Goals I want to be able to possibly walk again   ? Currently in Pain? Yes   ? Pain Score 1    2 aleve  ? Pain Location Back   ? Pain Orientation Right;Posterior   ? Pain Descriptors / Indicators Aching;Sore   ? Pain Onset In the past 7 days   ? ?  ?  ? ?  ? ? ?INTERVENTIONS: ? ?Progress note today- please refer to goals as they were just readdressed and updated last visit. Patient still dealing with acute low back pain.  ? ?Manual stretching: (reclined in power chair)  ? ?B hamstrings ?B trunk rotation ?B knee to chest ?  B piriformis ?B cal stretch ?30 sec hold x 4 each - slow and deliberate stretching. Patient presents with increased tightness right side vs. Left.  ? ?Reclined LE strengtheing ? ?Quad sets  ?Gluteal sets ?AAROM HIP ABD ?Manual resist leg press ?AAROM HIP Flex ?SAQ  ?15 reps each leg with attempted 2 sec hold  ? ?   ? ? ? ? ? ? ? ? ? ? ? ? ? ? ? ? ? ? ? ? ? ? ? ? ? ? PT Education - 06/24/21 0841   ? ? Education Details Exercise technique   ? Person(s) Educated Patient   ? Methods Explanation;Demonstration;Tactile cues;Verbal cues   ? Comprehension Verbalized understanding;Returned demonstration;Verbal cues required;Tactile cues required;Need further instruction   ? ?  ?  ? ?  ? ? ? PT Short  Term Goals - 05/10/21 1413   ? ?  ? PT SHORT TERM GOAL #1  ? Title Pt will be independent with HEP in order to improve strength and balance in order to decrease fall risk and improve function at home and work.   ? Baseline 01/04/2021= No formal HEP in place; 12/12 no HEP in place; 05/10/2021-Patient and his father were able to report compliance with curent HEP consisting of mostly seated/reclined LE strengthening. Both verbalize no questions at this time.   ? Time 6   ? Period Weeks   ? Status Achieved   ? Target Date 02/15/21   ? ?  ?  ? ?  ? ? ? ? PT Long Term Goals - 06/21/21 0848   ? ?  ? PT LONG TERM GOAL #1  ? Title Patient will increase BLE gross strength by 1/2 muscle grade to improve functional strength for improved independence with potential gait, increased standing tolerance and increased ADL ability.   ? Baseline 01/04/2021- Patient presents with 1/5 to 3-/5 B LE strength with MMT; 12/12: goal partially met for Left knee/hip; 05/10/2021= 2-/5 bilateal Hip flex; 3+/5 bilateral Knee ext; 06/21/2021= Patient presents with 2-/5 bilateral Hip flex; 3+/5 bilateral knee ext/flex; 2-/5 left ankle DF; 0/5 right ankle- and able to increase reps and resistance with LE's   ? Time 12   ? Period Weeks   ? Status Partially Met   ? Target Date 03/29/21   ?  ? PT LONG TERM GOAL #2  ? Title Patient will tolerate sitting unsupported demonstrating erect sitting posture for 15 minutes with CGA to demonstrate improved back extensor strength and improved sitting tolerance.   ? Baseline 01/04/2021- Patient confied to sitting in lift chair or electric power chair with back support and unable to sit upright without physical assistance; 12/12: tolerates <1 minutes upright unsupported sitting. 05/10/2021=static sit with forward trunk lean  in his power wheelchair without back support x approx 3 min. 06/21/2021=Unable to assess today due to patient with acute back pain but on previous visit able to sit x 8 min without back support.   ? Time  12   ? Period Weeks   ? Status On-going   ? Target Date 09/13/21   ?  ? PT LONG TERM GOAL #3  ? Title Patient will demonstrate ability to perform static standing in // bars > 2 min with Max Assist  without loss of balance and fair posture for improved overall strength for pre-gait and transfer activities.   ? Baseline 01/04/2021= Patient current uanble to stand- Dependent on hoyer or sit to stand lift for transfers. 05/10/2021=Not appropriate yet- Currently still  dependent with all transfers using hoyer. 06/21/2021= Patient continuing now to focus on LE strengthening to prepare for standing-unable to try today due to acute low back pain-  planning on attempting in new cert period.   ? Time 12   ? Period Weeks   ? Status On-going   ? Target Date 09/13/21   ?  ? PT LONG TERM GOAL #4  ? Title Pt will improve FOTO score by 10 points or more demonstrating improved perceived functional ability   ? Baseline FOTO 7 on 10/17; 03/15/21: FOTO 12; 05/10/2021 06/21/2021= 1   ? Time 12   ? Period Weeks   ? Status On-going   ? Target Date 03/29/21   ? ?  ?  ? ?  ? ? ? ? ? ? ? ? Plan - 06/23/21 0909   ? ? Clinical Impression Statement Patient presents today with good motivation. He responded well with manual technique and reported feeling much looser after session. He was able to perform some reclined LE strengthening well today without report of any increased low back pain. Patient's condition has the potential to improve in response to therapy. Maximum improvement is yet to be obtained. The anticipated improvement is attainable and reasonable in a generally predictable time.   ? Personal Factors and Comorbidities Comorbidity 3+;Time since onset of injury/illness/exacerbation   ? Comorbidities CVA, diabetes, Seizures   ? Examination-Activity Limitations Bathing;Bed Mobility;Bend;Caring for Others;Carry;Dressing;Hygiene/Grooming;Lift;Locomotion Level;Reach Overhead;Self Feeding;Sit;Squat;Stairs;Stand;Transfers;Toileting   ?  Examination-Participation Restrictions Cleaning;Community Activity;Driving;Laundry;Medication Management;Meal Prep;Occupation;Personal Finances;Shop;Yard Work;Volunteer   ? Stability/Clinical Decision Making Evolving/M

## 2021-06-28 ENCOUNTER — Encounter: Payer: Self-pay | Admitting: Occupational Therapy

## 2021-06-28 ENCOUNTER — Ambulatory Visit: Payer: BC Managed Care – PPO | Admitting: Occupational Therapy

## 2021-06-28 ENCOUNTER — Ambulatory Visit: Payer: BC Managed Care – PPO

## 2021-06-28 ENCOUNTER — Other Ambulatory Visit: Payer: Self-pay

## 2021-06-28 DIAGNOSIS — R262 Difficulty in walking, not elsewhere classified: Secondary | ICD-10-CM

## 2021-06-28 DIAGNOSIS — M6281 Muscle weakness (generalized): Secondary | ICD-10-CM | POA: Diagnosis not present

## 2021-06-28 DIAGNOSIS — R278 Other lack of coordination: Secondary | ICD-10-CM

## 2021-06-28 DIAGNOSIS — G4733 Obstructive sleep apnea (adult) (pediatric): Secondary | ICD-10-CM | POA: Diagnosis not present

## 2021-06-28 DIAGNOSIS — L8994 Pressure ulcer of unspecified site, stage 4: Secondary | ICD-10-CM | POA: Diagnosis not present

## 2021-06-28 DIAGNOSIS — R2681 Unsteadiness on feet: Secondary | ICD-10-CM

## 2021-06-28 DIAGNOSIS — R2689 Other abnormalities of gait and mobility: Secondary | ICD-10-CM | POA: Diagnosis not present

## 2021-06-28 DIAGNOSIS — R269 Unspecified abnormalities of gait and mobility: Secondary | ICD-10-CM | POA: Diagnosis not present

## 2021-06-28 DIAGNOSIS — I6930 Unspecified sequelae of cerebral infarction: Secondary | ICD-10-CM

## 2021-06-28 DIAGNOSIS — I6389 Other cerebral infarction: Secondary | ICD-10-CM | POA: Diagnosis not present

## 2021-06-28 DIAGNOSIS — R32 Unspecified urinary incontinence: Secondary | ICD-10-CM | POA: Diagnosis not present

## 2021-06-28 NOTE — Therapy (Signed)
[name] ?[facility] ?[place], [place], [address] ?Phone: [phone]   Fax:  [phone] ? ?Physical Therapy Treatment ? ?Patient Details  ?Name: Paul Hall ?MRN: [id number] ?Date of Birth: 15-Sep-1995 ?Referring Provider (PT): Collene Mares, PA-C ? ? ?Encounter Date: 06/28/2021 ? ? ? ?Past Medical History:  ?Diagnosis Date  ? Diabetes mellitus ([place])   ? Hypertension   ? Stroke [facility])   ? ? ?Past Surgical History:  ?Procedure Laterality Date  ? IVC FILTER PLACEMENT ([facility] HX)    ? LEG SURGERY    ? PEG TUBE PLACEMENT    ? TRACHEOSTOMY    ? ? ?There were no vitals filed for this visit. ? ? ? ? ?INTERVENTIONS:  ? ? ? ? ? ?Therapeutic activities:  ? ?Hoyer dependent transfer from power wheelchair to mat table with 2 person assist.  ?Patient was positioned from side of mat with legs dangling off and wedge and 2 pillows positioned under upper trunk/head while lowered down.  ? ?Patient then required max A x 2 person to sit up at [facility].  ?Once sitting with was able maintain sitting at [facility] for 25 min- focusing heavily on static sit while holding conversation (focusing on core). Patient then performed some dynamic UE activity (back pain free) - including ant/post trunk lean, side to side lean, moving arms from lap to edge of mat.  ? ?Patient was limited by fatigue but able to sit with only CGA for majority of sitting today.  ? ?Patient was then assisted dependent from mat to w/c via hoyer.  ? ? ? ? ? ? ? ? ? ? ? ? ? ? ? ? ? ? ? ? ? ? ? PT Education - 06/29/21 1654   ? ? Education Details Importanc of core for sitting/standing ed   ? Person(s) Educated Patient   ? Methods Explanation;Demonstration   ? Comprehension Verbalized understanding;Returned demonstration   ? ?  ?  ? ?  ? ? ? PT Short Term Goals - 05/10/21 1413   ? ?  ? PT SHORT TERM GOAL #1  ? Title Pt will be independent with HEP in order to improve strength and balance in order to decrease fall risk  and improve function at home and work.   ? Baseline 01/04/2021= No formal HEP in place; 12/12 no HEP in place; 05/10/2021-Patient and his father were able to report compliance with curent HEP consisting of mostly seated/reclined LE strengthening. Both verbalize no questions at this time.   ? Time 6   ? Period Weeks   ? Status Achieved   ? Target Date 02/15/21   ? ?  ?  ? ?  ? ? ? ? PT Long Term Goals - 06/21/21 0848   ? ?  ? PT LONG TERM GOAL #1  ? Title Patient will increase BLE gross strength by 1/2 muscle grade to improve functional strength for improved independence with potential gait, increased standing tolerance and increased ADL ability.   ? Baseline 01/04/2021- Patient presents with 1/5 to 3-/5 B LE strength with MMT; 12/12: goal partially met for Left knee/hip; 05/10/2021= 2-/5 bilateal Hip flex; 3+/5 bilateral Knee ext; 06/21/2021= Patient presents with 2-/5 bilateral Hip flex; 3+/5 bilateral knee ext/flex; 2-/5 left ankle DF; 0/5 right ankle- and able to increase reps and resistance with LE's   ? Time 12   ? Period Weeks   ? Status Partially Met   ? Target Date 03/29/21   ?  ?  PT LONG TERM GOAL #2  ? Title Patient will tolerate sitting unsupported demonstrating erect sitting posture for 15 minutes with CGA to demonstrate improved back extensor strength and improved sitting tolerance.   ? Baseline 01/04/2021- Patient confied to sitting in lift chair or electric power chair with back support and unable to sit upright without physical assistance; 12/12: tolerates <1 minutes upright unsupported sitting. 05/10/2021=static sit with forward trunk lean  in his power wheelchair without back support x approx 3 min. 06/21/2021=Unable to assess today due to patient with acute back pain but on previous visit able to sit x 8 min without back support.   ? Time 12   ? Period Weeks   ? Status On-going   ? Target Date 09/13/21   ?  ? PT LONG TERM GOAL #3  ? Title Patient will demonstrate ability to perform static standing in // bars  > 2 min with Max Assist  without loss of balance and fair posture for improved overall strength for pre-gait and transfer activities.   ? Baseline 01/04/2021= Patient current uanble to stand- Dependent on hoyer or sit to stand lift for transfers. 05/10/2021=Not appropriate yet- Currently still dependent with all transfers using hoyer. 06/21/2021= Patient continuing now to focus on LE strengthening to prepare for standing-unable to try today due to acute low back pain-  planning on attempting in new cert period.   ? Time 12   ? Period Weeks   ? Status On-going   ? Target Date 09/13/21   ?  ? PT LONG TERM GOAL #4  ? Title Pt will improve FOTO score by 10 points or more demonstrating improved perceived functional ability   ? Baseline FOTO 7 on 10/17; 03/15/21: FOTO 12; 05/10/2021 06/21/2021= 1   ? Time 12   ? Period Weeks   ? Status On-going   ? Target Date 03/29/21   ? ?  ?  ? ?  ? ? ? ? ? ? ? ? ? ?Patient will benefit from skilled therapeutic intervention in order to improve the following deficits and impairments:  Abnormal gait, Decreased activity tolerance, Decreased balance, Decreased cognition, Cardiopulmonary status limiting activity, Decreased coordination, Decreased endurance, Decreased knowledge of use of DME, Decreased mobility, Decreased range of motion, Decreased safety awareness, Decreased strength, Difficulty walking, Hypomobility, Impaired perceived functional ability, Impaired flexibility, Impaired UE functional use, Impaired vision/preception, Improper body mechanics, Postural dysfunction, Obesity ? ?Visit Diagnosis: ?Muscle weakness (generalized) ? ?Other lack of coordination ? ?Difficulty in walking, not elsewhere classified ? ?Unsteadiness on feet ? ?Abnormality of gait and mobility ? ?History of CVA with residual deficit ? ? ? ? ?Problem List ?Patient Active Problem List  ? Diagnosis Date Noted  ? Sepsis due to vancomycin resistant Enterococcus species ([place]) 06/06/2019  ? SIRS (systemic inflammatory  response syndrome) ([place]) 06/05/2019  ? Acute lower UTI 06/05/2019  ? VRE (vancomycin-resistant Enterococci) infection 06/05/2019  ? Anemia 06/05/2019  ? Skin ulcer of sacrum with necrosis of muscle ([place])   ? Urinary retention   ? Type 2 diabetes mellitus without complication, with long-term current use of insulin ([place])   ? Tachycardia   ? Lower extremity edema   ? Acute metabolic encephalopathy   ? Obstructive sleep apnea   ? Morbid obesity with BMI of 60.0-69.9, adult ([place])   ? Goals of care, counseling/discussion   ? Palliative care encounter   ? Sepsis ([place]) 04/27/2019  ? H/O insulin dependent diabetes mellitus 04/27/2019  ? History of CVA with residual  deficit 04/27/2019  ? Seizure disorder ([place]) 04/27/2019  ? Decubitus ulcer of sacral region, stage 4 ([place]) 04/27/2019  ? ? ?Lewis Moccasin, PT ?06/30/2021, 7:59 AM ? ?[place] ?[facility] ?[place], [place], [address] ?Phone: [phone]   Fax:  [phone] ? ?Name: Gottlieb Zuercher ?MRN: [id number] ?Date of Birth: 02/14/1996 ? ? ? ?

## 2021-06-28 NOTE — Therapy (Signed)
[place] ?[facility] ?[address] ?[place], [place], [address] ?Phone: [phone]   Fax:  [phone] ? ?Occupational Therapy Treatment ? ?Patient Details  ?Name: Paul Hall ?MRN: [id number] ?Date of Birth: October 03, 1995 ?Referring Provider (OT): Lenise Herald ? ? ?Encounter Date: 06/28/2021 ? ? OT End of Session - 06/28/21 1814   ? ? Visit Number 41   ? Number of Visits 72   ? Date for OT Re-Evaluation 09/08/21   ? Authorization Type Progress report period starting 03/17/2021   ? OT Start Time 1320   ? OT Stop Time 1345   ? OT Time Calculation (min) 25 min   ? Activity Tolerance Patient tolerated treatment well   ? Behavior During Therapy [facility] for tasks assessed/performed   ? ?  ?  ? ?  ? ? ?Past Medical History:  ?Diagnosis Date  ? Diabetes mellitus ([facility])   ? Hypertension   ? Stroke [facility])   ? ? ?Past Surgical History:  ?Procedure Laterality Date  ? IVC FILTER PLACEMENT ([facility] HX)    ? LEG SURGERY    ? PEG TUBE PLACEMENT    ? TRACHEOSTOMY    ? ? ?There were no vitals filed for this visit. ? ? Subjective Assessment - 06/28/21 1813   ? ? Subjective  Pt. reports having had back discomfort over the weekend.   ? Patient is accompanied by: Family member   ? Pertinent History Pt. is a 26 y.o. male who was hospitalized, and diagnosed with COVID-19, sustained a CVA with resultant quadreplegia in 01/2019. Pt. was hospitalized with VRE  UTI. Pt. PMHx includes: urinary retention, Seizure Disorder, Obstructive Sleep Disorder, DM Type II, and Morbid obesity. Pt. resides at home with his parents.   ? Currently in Pain? Yes   ? Pain Score 8    ? Pain Location Finger (Comment which one)   ? Pain Orientation Right;Left   ? Pain Descriptors / Indicators Tightness   ? Pain Type Chronic pain   ? ?  ?  ? ?  ? ?OT TREATMENT   ?  ?Therapeutic Exercise: ?  ?Pt. tolerated bilateral wrist flexion, extension, and MP, PIP, and DIP flexion, and extension, however presented with 8/10 pain in  with gentle ROM to the right 4th digit PIP.  ?  ?Pt was late for the OT session today. Pt. presents with 8/10 tightness pain in the bilateral hands/digits today. Pt. tolerated PROM  with moist heat modality today. Pt. Continues to work on improving BUE ROM in order to improve UE engagement during ADLs, and IADL tasks.  ?  ?  ? ? ? ? ? ? ? ? ? ? ? ? ? ? ? ? ? ? ? ? ? OT Education - 06/28/21 1814   ? ? Education Details BUE AROM   ? Person(s) Educated Patient   ? Methods Verbal cues;Tactile cues   ? Comprehension Verbalized understanding;Verbal cues required;Need further instruction;Returned demonstration   ? ?  ?  ? ?  ? ? ? ? ? ? OT Long Term Goals - 06/23/21 1555   ? ?  ? OT LONG TERM GOAL #1  ? Title Pt. will improve FOTO score by 5 points for clinically significant improvements during ADLs, and IADLs,   ? Baseline 30th visit: FOTO: 37, 21st: FOTO 43. 10th Visit: FOTO score 42 Eval: FOTO score: 41 FOTO score: 39   ? Time 12   ? Period Weeks   ? Status  On-going   ? Target Date 09/08/21   ?  ? OT LONG TERM GOAL #3  ? Title Pt. will improve right Abduction by 10 degrees to assist caregivers with underarm hygiene care.   ? Baseline Eval: AROM abduction RUE 76. Pt. continues to present with limited bilateral abduction. 21st: AROM abduction RUE 80.06/16/2021: Right shulder abduction 108. 06/23/2021: Right shoulder abduction: 110   ? Time 12   ? Period Weeks   ? Status On-going   ? Target Date 09/08/21   ?  ? OT LONG TERM GOAL #4  ? Title Pt. will demonstrate visual compensatory strategies for navigating through his environment.   ? Baseline 30th visit: Pt. continues to require assist to navigate through his environment. 20th: Pt continues to require assist to navigate environment, minimal knowledge of strategies. Eval: Limited 06/23/2021: Pt. continues to be limited, and requires assist  with navigating through his environment.   ? Time 12   ? Period Weeks   ? Status On-going   ? Target Date 09/08/21   ?  ? OT LONG TERM  GOAL #5  ? Title Pt. will demonstrate visual compensatory strategies for tabletop tasks within his near space.   ? Baseline 06/16/2021: Pt. continues to present with visual limitations for tabletop tasks in his near space. Pt. continues to present with limited vision. 21st: Pt can identify objects on tabletop ~waterbottle sized, improves with contrasting colours, cannot ID small onjects   ? Time 12   ? Period Weeks   ? Status On-going   ? Target Date 09/08/21   ?  ? OT LONG TERM GOAL #6  ? Title Pt. will perform hand to mouth patterns with supervision in preparation for self-feeding.   ? Baseline 06/23/2021: Pt. is able to use his bilateral hands to perfrom hand to mouth patterns for self-feeding with finger foods. Pt. is now able to use his left hand to bring a water bottle to his mouth,a nd drink from it. 30th visit: Pt. continues to present with weakness limiting functional hand to mouth patterns of movement. Pt. is able to perform hand to mouth patterns, however reports weakness in his hand  limits a secure grasp on the utensil. Pt. requires assist to perform. Eval: Pt. has difficulty.   ? Time 12   ? Period Weeks   ? Target Date 09/08/21   ?  ? OT LONG TERM GOAL #7  ? Title Pt. will perform one step self-grooming tasks with minA.   ? Baseline 06/23/2021: Pt. is able to perform the hand to face pattern with the left hand, however has difficulty actively sustaining his BUEs in elevation long enough to perform self-grooming tasks. 06/16/2021: Pt. is able to perform the hand to face pattern with the left hand, however has difficulty actively sustaining his BUEs in elevation long enough to perform self-grooming tasks.   ? Time 12   ? Period Weeks   ? Status On-going   ? Target Date 09/08/21   ?  ? OT LONG TERM GOAL #8  ? Title Pt will complete UB dressing tasks with SETUP and assist to thread over hands only   ? Baseline 06/23/2021: Pt. requires MinA to thread arms, and requires maxA to donn over upper arms, MaxAx2 to  donn over his back. 06/16/2021: Pt. has progressed  to require MinA to thread arms, and requires maxA to donn over upper arms, MaxAx2 to donn over his back.   ? Time 12   ? Period Weeks   ?  Status On-going   ? Target Date 09/08/21   ? ?  ?  ? ?  ? ? ? ? ? ? ? ? Plan - 06/28/21 1814   ? ? Clinical Impression Statement Pt was late for the OT session today. Pt. presents with 8/10 tightness pain in the bilateral hands/digits today. Pt. tolerated PROM  with moist heat modality today. Pt. Continues to work on improving BUE ROM in order to improve UE engagement during ADLs, and IADL tasks.  ?  ?  ? OT Occupational Profile and History Detailed Assessment- Review of Records and additional review of physical, cognitive, psychosocial history related to current functional performance   ? Occupational performance deficits (Please refer to evaluation for details): ADL's;IADL's   ? Body Structure / Function / Physical Skills ADL;FMC;ROM;Dexterity;IADL;Endurance;Obesity;Strength;UE functional use   ? Rehab Potential Good   ? Clinical Decision Making Multiple treatment options, significant modification of task necessary   ? Comorbidities Affecting Occupational Performance: Presence of comorbidities impacting occupational performance   ? Modification or Assistance to Complete Evaluation  Max significant modification of tasks or assist is necessary to complete   ? OT Frequency 2x / week   ? OT Duration 12 weeks   ? OT Treatment/Interventions Self-care/ADL training;Therapeutic exercise;Neuromuscular education;Visual/perceptual remediation/compensation;Patient/family education;Therapeutic activities;DME and/or AE instruction;Passive range of motion;Moist Heat;Electrical Stimulation   ? Consulted and Agree with Plan of Care Patient   ? ?  ?  ? ?  ? ? ?Patient will benefit from skilled therapeutic intervention in order to improve the following deficits and impairments:   ?Body Structure / Function / Physical Skills: ADL, FMC, ROM,  Dexterity, IADL, Endurance, Obesity, Strength, UE functional use ?  ?  ? ? ?Visit Diagnosis: ?Muscle weakness (generalized) ? ?Other lack of coordination ? ? ? ?Problem List ?Patient Active Problem List  ? Diagn

## 2021-06-30 ENCOUNTER — Ambulatory Visit: Payer: BC Managed Care – PPO

## 2021-06-30 ENCOUNTER — Encounter: Payer: Self-pay | Admitting: Occupational Therapy

## 2021-06-30 ENCOUNTER — Ambulatory Visit: Payer: BC Managed Care – PPO | Admitting: Occupational Therapy

## 2021-06-30 DIAGNOSIS — R269 Unspecified abnormalities of gait and mobility: Secondary | ICD-10-CM | POA: Diagnosis not present

## 2021-06-30 DIAGNOSIS — I6930 Unspecified sequelae of cerebral infarction: Secondary | ICD-10-CM | POA: Diagnosis not present

## 2021-06-30 DIAGNOSIS — M6281 Muscle weakness (generalized): Secondary | ICD-10-CM

## 2021-06-30 DIAGNOSIS — R278 Other lack of coordination: Secondary | ICD-10-CM | POA: Diagnosis not present

## 2021-06-30 DIAGNOSIS — R262 Difficulty in walking, not elsewhere classified: Secondary | ICD-10-CM

## 2021-06-30 DIAGNOSIS — R2681 Unsteadiness on feet: Secondary | ICD-10-CM | POA: Diagnosis not present

## 2021-06-30 DIAGNOSIS — R2689 Other abnormalities of gait and mobility: Secondary | ICD-10-CM

## 2021-06-30 NOTE — Therapy (Signed)
[place] ?[facility] ?[address] ?[place], [place], [address] ?Phone: [phone]   Fax:  [phone] ? ?Occupational Therapy Treatment ? ?Patient Details  ?Name: Paul BolderSamuel Hakeem Hall ?MRN: [id number] ?Date of Birth: [birth year] ?Referring Provider (OT): Lenise HeraldBenjamin Mann ? ? ?Encounter Date: 06/30/2021 ? ? OT End of Session - 06/30/21 1532   ? ? Visit Number 41   ? Number of Visits 72   ? Date for OT Re-Evaluation 09/08/21   ? Authorization Type Progress report period starting 03/17/2021   ? OT Start Time 1310   ? OT Stop Time 1345   ? OT Time Calculation (min) 35 min   ? Activity Tolerance Patient tolerated treatment well   ? Behavior [facility] for tasks assessed/performed   ? ?  ?  ? ?  ? ? ?Past Medical History:  ?Diagnosis Date  ? Diabetes mellitus (HCC)   ? Hypertension   ? Stroke [facility](HCC)   ? ? ?Past Surgical History:  ?Procedure Laterality Date  ? IVC FILTER PLACEMENT ([facility] HX)    ? LEG SURGERY    ? PEG TUBE PLACEMENT    ? TRACHEOSTOMY    ? ? ?There were no vitals filed for this visit. ? ? Subjective Assessment - 06/30/21 1531   ? ? Subjective  Pt. reports doing well today.  ? Patient is accompanied by: Family member   ? Pertinent History Pt. is a 26 y.o. male who was hospitalized, and diagnosed with COVID-19, sustained a CVA with resultant quadreplegia in 01/2019. Pt. was hospitalized with VRE  UTI. Pt. PMHx includes: urinary retention, Seizure Disorder, Obstructive Sleep Disorder, DM Type II, and Morbid obesity. Pt. resides at home with his parents.   ? Currently in Pain? No/denies   ? ?  ?  ? ?  ?OT TREATMENT   ?  ?Therapeutic Exercise: ?  ?Pt. tolerated PROM to bilateral wrist flexion, extension, and MP, PIP, and DIP flexion, and extension. Pain was improved today with gentle ROM to the right 4th digit PIP.  ?  ?Pt was late for the OT session today. Pt. Was in the cafeteria finishing lunch. Pt. tolerated PROM  with moist heat modality today. Pain was  improved today. Pt. continues to work on improving BUE ROM in order to improve UE engagement during ADLs, and IADL tasks.  ?  ?  ? ? ? ? ? ? ? ? ? ? ? ? ? ? ? ? ? ? ? ? ? OT Education - 06/30/21 1532   ? ? Education Details BUE AROM   ? Person(s) Educated Patient   ? Methods Verbal cues;Tactile cues   ? Comprehension Verbalized understanding;Verbal cues required;Need further instruction;Returned demonstration   ? ?  ?  ? ?  ? ? ? ? ? ? OT Long Term Goals - 06/23/21 1555   ? ?  ? OT LONG TERM GOAL #1  ? Title Pt. will improve FOTO score by 5 points for clinically significant improvements during ADLs, and IADLs,   ? Baseline 30th visit: FOTO: 37, 21st: FOTO 43. 10th Visit: FOTO score 42 Eval: FOTO score: 41 FOTO score: 39   ? Time 12   ? Period Weeks   ? Status On-going   ? Target Date 09/08/21   ?  ? OT LONG TERM GOAL #3  ? Title Pt. will improve right Abduction by 10 degrees to assist caregivers with underarm hygiene care.   ? Baseline Eval: AROM abduction RUE  76. Pt. continues to present with limited bilateral abduction. 21st: AROM abduction RUE 80.06/16/2021: Right shulder abduction 108. 06/23/2021: Right shoulder abduction: 110   ? Time 12   ? Period Weeks   ? Status On-going   ? Target Date 09/08/21   ?  ? OT LONG TERM GOAL #4  ? Title Pt. will demonstrate visual compensatory strategies for navigating through his environment.   ? Baseline 30th visit: Pt. continues to require assist to navigate through his environment. 20th: Pt continues to require assist to navigate environment, minimal knowledge of strategies. Eval: Limited 06/23/2021: Pt. continues to be limited, and requires assist  with navigating through his environment.   ? Time 12   ? Period Weeks   ? Status On-going   ? Target Date 09/08/21   ?  ? OT LONG TERM GOAL #5  ? Title Pt. will demonstrate visual compensatory strategies for tabletop tasks within his near space.   ? Baseline 06/16/2021: Pt. continues to present with visual limitations for tabletop  tasks in his near space. Pt. continues to present with limited vision. 21st: Pt can identify objects on tabletop ~waterbottle sized, improves with contrasting colours, cannot ID small onjects   ? Time 12   ? Period Weeks   ? Status On-going   ? Target Date 09/08/21   ?  ? OT LONG TERM GOAL #6  ? Title Pt. will perform hand to mouth patterns with supervision in preparation for self-feeding.   ? Baseline 06/23/2021: Pt. is able to use his bilateral hands to perfrom hand to mouth patterns for self-feeding with finger foods. Pt. is now able to use his left hand to bring a water bottle to his mouth,a nd drink from it. 30th visit: Pt. continues to present with weakness limiting functional hand to mouth patterns of movement. Pt. is able to perform hand to mouth patterns, however reports weakness in his hand  limits a secure grasp on the utensil. Pt. requires assist to perform. Eval: Pt. has difficulty.   ? Time 12   ? Period Weeks   ? Target Date 09/08/21   ?  ? OT LONG TERM GOAL #7  ? Title Pt. will perform one step self-grooming tasks with minA.   ? Baseline 06/23/2021: Pt. is able to perform the hand to face pattern with the left hand, however has difficulty actively sustaining his BUEs in elevation long enough to perform self-grooming tasks. 06/16/2021: Pt. is able to perform the hand to face pattern with the left hand, however has difficulty actively sustaining his BUEs in elevation long enough to perform self-grooming tasks.   ? Time 12   ? Period Weeks   ? Status On-going   ? Target Date 09/08/21   ?  ? OT LONG TERM GOAL #8  ? Title Pt will complete UB dressing tasks with SETUP and assist to thread over hands only   ? Baseline 06/23/2021: Pt. requires MinA to thread arms, and requires maxA to donn over upper arms, MaxAx2 to donn over his back. 06/16/2021: Pt. has progressed  to require MinA to thread arms, and requires maxA to donn over upper arms, MaxAx2 to donn over his back.   ? Time 12   ? Period Weeks   ? Status  On-going   ? Target Date 09/08/21   ? ?  ?  ? ?  ? ? ? ? ? ? ? ? Plan - 06/30/21 1534   ? ? Clinical Impression Statement Pt was late  for the OT session today. Pt. was in the cafeteria finishing lunch. Pt. tolerated PROM  with moist heat modality today. Pain was improved today. Pt. continues to work on improving BUE ROM in order to improve UE engagement during ADLs, and IADL tasks.  ?  ?   ? OT Occupational Profile and History Detailed Assessment- Review of Records and additional review of physical, cognitive, psychosocial history related to current functional performance   ? Occupational performance deficits (Please refer to evaluation for details): ADL's;IADL's   ? Body Structure / Function / Physical Skills ADL;FMC;ROM;Dexterity;IADL;Endurance;Obesity;Strength;UE functional use   ? Rehab Potential Good   ? Clinical Decision Making Multiple treatment options, significant modification of task necessary   ? Comorbidities Affecting Occupational Performance: Presence of comorbidities impacting occupational performance   ? Modification or Assistance to Complete Evaluation  Max significant modification of tasks or assist is necessary to complete   ? OT Frequency 2x / week   ? OT Duration 12 weeks   ? OT Treatment/Interventions Self-care/ADL training;Therapeutic exercise;Neuromuscular education;Visual/perceptual remediation/compensation;Patient/family education;Therapeutic activities;DME and/or AE instruction;Passive range of motion;Moist Heat;Electrical Stimulation   ? Consulted and Agree with Plan of Care Patient   ? ?  ?  ? ?  ? ? ?Patient will benefit from skilled therapeutic intervention in order to improve the following deficits and impairments:   ?Body Structure / Function / Physical Skills: ADL, FMC, ROM, Dexterity, IADL, Endurance, Obesity, Strength, UE functional use ?  ?  ? ? ?Visit Diagnosis: ?Muscle weakness (generalized) ? ?Other lack of coordination ? ? ? ?Problem List ?Patient Active Problem List  ?  Diagnosis Date Noted  ? Sepsis due to vancomycin resistant Enterococcus species (HCC) 06/06/2019  ? SIRS (systemic inflammatory response syndrome) (HCC) 06/05/2019  ? Acute lower UTI 06/05/2019  ? VRE (vancomycin-r

## 2021-07-01 NOTE — Therapy (Signed)
[place] ?[facility] ?[place], [place], [address] ?Phone: [phone]   Fax:  [phone] ? ?Physical Therapy Treatment ? ?Patient Details  ?Name: Paul Hall ?MRN: [id number] ?Date of Birth: 01/01/1996 ?Referring Provider (PT): Collene Mares, PA-C ? ? ?Encounter Date: 06/30/2021 ? ? PT End of Session - 06/30/21 1104   ? ? Visit Number 32   ? Number of Visits 37   ? Date for PT Re-Evaluation 09/13/21   ? Authorization Type [employer] and [employer]- Need auth past 12th visit   ? Authorization Time Period 01/04/21-03/29/21; Recert [id number]   ? Progress Note Due on Visit 40   ? PT Start Time 1346   ? PT Stop Time 1428   ? PT Time Calculation (min) 42 min   ? Equipment Utilized During Treatment --   [employer] lift  ? Activity Tolerance Patient tolerated treatment well   queasiness/nausea  ? Behavior During Therapy [facility] for tasks assessed/performed   ? ?  ?  ? ?  ? ? ?Past Medical History:  ?Diagnosis Date  ? Diabetes mellitus ([place])   ? Hypertension   ? Stroke [facility])   ? ? ?Past Surgical History:  ?Procedure Laterality Date  ? IVC FILTER PLACEMENT ([facility] HX)    ? LEG SURGERY    ? PEG TUBE PLACEMENT    ? TRACHEOSTOMY    ? ? ?There were no vitals filed for this visit. ? ? Subjective Assessment - 06/30/21 1344   ? ? Subjective Patient reports his back is sore from sitting last visit. He states he was surprised at how long he was able to sit unsupported last visit.   ? Patient is accompained by: Family member   ? Pertinent History Paul Hall is a 2yoM who presents with severe weakness, quadriparesis, altered sensorium, and visual impairment s/p critical illness and prolonged hospitalization. Pt hospitalized in October 2020 with ARDS 2/2 COVID19 infection. Pt sustained a complex and lengthy hospitalization which included tracheostomy, prolonged sedation, ECMO. In this period pt sustained CVA and SDH. Pt has now been liberated from  tracheostomy and G-tube. Pt has since been hospitalized for wound infection and UTI. Pt lives with parents at home, has hospital bed and left chair, hoyer lift transfers, and power WC for mobility needs. Pt needs heavy physical assistance with ADL 2/2 BUE contractures and motor dysfunction.   ? Limitations Lifting;Standing;Walking;Writing;Reading;Sitting;House hold activities   ? How long can you sit comfortably? Patient reports uable to sit up long < 4 hours due to buttock discomfort   ? How long can you stand comfortably? Patient is currently unable to stand   ? How long can you walk comfortably? Patient is currently unable to walk   ? Diagnostic tests No recent relavent diagnostic tests   ? Patient Stated Goals I want to be able to possibly walk again   ? Currently in Pain? Yes   ? Pain Score 3    ? Pain Location Back   ? Pain Orientation Right;Left;Posterior;Lower   ? Pain Descriptors / Indicators Aching;Sore   ? Pain Type Acute pain   ? Pain Onset In the past 7 days   ? Pain Frequency Intermittent   ? ?  ?  ? ?  ? ? ?INTERVENTIONS:  ? ? ? ?Manual stretching: (reclined in power chair)  ?  ?B hamstrings- hold 20 sec x 3 each LE ?B trunk rotation- Seated - hold 20 sec x 3 each LE ?B  cal stretch-30 sec hold x 4 each -  ?Lumbar flex stretch- Hold 30 sec x 4- Patient reports he can feel a "Big stretch but no worse with pain." ?  ?Therapeutic Exercises:  ?Hip march (AAROM) x 20 reps BLE ?Knee ext B LE- 20 reps (AAROM On RLE)  ?Hip add (Manual resistance) x 5 sec hold x 20 reps ?Hip abd resistive (manual) x 20 reps each LE ?Assisted DF on left and Passive on right LE x 20 reps:  ?Hamstring curls using RTB x 20 reps each LE.  ?  ?Education provided throughout session via VC/TC and demonstration to facilitate movement at target joints and correct muscle activation for all testing and exercises performed.  ? ? ? ? ? ? ? ? ? ? ? ? ? ? ? ? ? ? ? ? ? PT Education - 07/01/21 1107   ? ? Education Details Exercise technique   ?  Person(s) Educated Patient   ? Methods Explanation;Demonstration;Tactile cues;Verbal cues   ? Comprehension Verbalized understanding;Returned demonstration;Verbal cues required;Tactile cues required;Need further instruction   ? ?  ?  ? ?  ? ? ? PT Short Term Goals - 05/10/21 1413   ? ?  ? PT SHORT TERM GOAL #1  ? Title Pt will be independent with HEP in order to improve strength and balance in order to decrease fall risk and improve function at home and work.   ? Baseline 01/04/2021= No formal HEP in place; 12/12 no HEP in place; 05/10/2021-Patient and his father were able to report compliance with curent HEP consisting of mostly seated/reclined LE strengthening. Both verbalize no questions at this time.   ? Time 6   ? Period Weeks   ? Status Achieved   ? Target Date 02/15/21   ? ?  ?  ? ?  ? ? ? ? PT Long Term Goals - 06/21/21 0848   ? ?  ? PT LONG TERM GOAL #1  ? Title Patient will increase BLE gross strength by 1/2 muscle grade to improve functional strength for improved independence with potential gait, increased standing tolerance and increased ADL ability.   ? Baseline 01/04/2021- Patient presents with 1/5 to 3-/5 B LE strength with MMT; 12/12: goal partially met for Left knee/hip; 05/10/2021= 2-/5 bilateal Hip flex; 3+/5 bilateral Knee ext; 06/21/2021= Patient presents with 2-/5 bilateral Hip flex; 3+/5 bilateral knee ext/flex; 2-/5 left ankle DF; 0/5 right ankle- and able to increase reps and resistance with LE's   ? Time 12   ? Period Weeks   ? Status Partially Met   ? Target Date 03/29/21   ?  ? PT LONG TERM GOAL #2  ? Title Patient will tolerate sitting unsupported demonstrating erect sitting posture for 15 minutes with CGA to demonstrate improved back extensor strength and improved sitting tolerance.   ? Baseline 01/04/2021- Patient confied to sitting in lift chair or electric power chair with back support and unable to sit upright without physical assistance; 12/12: tolerates <1 minutes upright unsupported  sitting. 05/10/2021=static sit with forward trunk lean  in his power wheelchair without back support x approx 3 min. 06/21/2021=Unable to assess today due to patient with acute back pain but on previous visit able to sit x 8 min without back support.   ? Time 12   ? Period Weeks   ? Status On-going   ? Target Date 09/13/21   ?  ? PT LONG TERM GOAL #3  ? Title Patient will demonstrate ability  to perform static standing in // bars > 2 min with Max Assist  without loss of balance and fair posture for improved overall strength for pre-gait and transfer activities.   ? Baseline 01/04/2021= Patient current uanble to stand- Dependent on hoyer or sit to stand lift for transfers. 05/10/2021=Not appropriate yet- Currently still dependent with all transfers using hoyer. 06/21/2021= Patient continuing now to focus on LE strengthening to prepare for standing-unable to try today due to acute low back pain-  planning on attempting in new cert period.   ? Time 12   ? Period Weeks   ? Status On-going   ? Target Date 09/13/21   ?  ? PT LONG TERM GOAL #4  ? Title Pt will improve FOTO score by 10 points or more demonstrating improved perceived functional ability   ? Baseline FOTO 7 on 10/17; 03/15/21: FOTO 12; 05/10/2021 06/21/2021= 1   ? Time 12   ? Period Weeks   ? Status On-going   ? Target Date 03/29/21   ? ?  ?  ? ?  ? ? ? ? ? ? ? ? Plan - 06/30/21 1103   ? ? Clinical Impression Statement Patient presented with low back soreness so focused on some Flexibility/stretching then progressed to more strengthening with patient denying any pain with today's session. He reported his legs were fatigued with performing 20 reps but no other issues. Pt will continue to benefit from skilled PT intervention to improve his overall function, weight shifting and postiion change ability, LE strength, trunk strength and improve his overall quality of life   ? Personal Factors and Comorbidities Comorbidity 3+;Time since onset of injury/illness/exacerbation   ?  Comorbidities CVA, diabetes, Seizures   ? Examination-Activity Limitations Bathing;Bed Mobility;Bend;Caring for Others;Carry;Dressing;Hygiene/Grooming;Lift;Locomotion Level;Reach Overhead;Self Feeding;Sit;Squat;Sta

## 2021-07-05 ENCOUNTER — Ambulatory Visit: Payer: BC Managed Care – PPO

## 2021-07-05 ENCOUNTER — Ambulatory Visit: Payer: BC Managed Care – PPO | Admitting: Occupational Therapy

## 2021-07-07 ENCOUNTER — Ambulatory Visit: Payer: BC Managed Care – PPO | Attending: Physician Assistant | Admitting: Occupational Therapy

## 2021-07-07 ENCOUNTER — Ambulatory Visit: Payer: BC Managed Care – PPO

## 2021-07-07 DIAGNOSIS — R2689 Other abnormalities of gait and mobility: Secondary | ICD-10-CM | POA: Insufficient documentation

## 2021-07-07 DIAGNOSIS — R2681 Unsteadiness on feet: Secondary | ICD-10-CM | POA: Insufficient documentation

## 2021-07-07 DIAGNOSIS — R278 Other lack of coordination: Secondary | ICD-10-CM

## 2021-07-07 DIAGNOSIS — R262 Difficulty in walking, not elsewhere classified: Secondary | ICD-10-CM

## 2021-07-07 DIAGNOSIS — M6281 Muscle weakness (generalized): Secondary | ICD-10-CM | POA: Diagnosis not present

## 2021-07-07 DIAGNOSIS — R269 Unspecified abnormalities of gait and mobility: Secondary | ICD-10-CM

## 2021-07-07 DIAGNOSIS — I6930 Unspecified sequelae of cerebral infarction: Secondary | ICD-10-CM

## 2021-07-07 NOTE — Therapy (Signed)
[facility] ?[place], [place], [address] ?Phone: [phone]   Fax:  [phone] ? ?Physical Therapy Treatment ? ?Patient Details  ?Name: Paul Hall ?MRN: [id number] ?Date of Birth: December 12, 1995 ?Referring Provider (PT): Collene Mares, PA-C ? ? ?Encounter Date: 07/07/2021 ? ? PT End of Session - 07/07/21 1448   ? ? Visit Number 33   ? Number of Visits 37   ? Date for PT Re-Evaluation 09/13/21   ? Authorization Type [employer] and [employer]- Need auth past 12th visit   ? Authorization Time Period 01/04/21-03/29/21; Recert [id number]   ? Progress Note Due on Visit 40   ? PT Start Time 1350   ? PT Stop Time 1430   ? PT Time Calculation (min) 40 min   ? Equipment Utilized During Treatment --   [employer] lift  ? Activity Tolerance Patient tolerated treatment well   queasiness/nausea  ? Behavior During Therapy [facility] for tasks assessed/performed   ? ?  ?  ? ?  ? ? ?Past Medical History:  ?Diagnosis Date  ? Diabetes mellitus ([place])   ? Hypertension   ? Stroke [facility])   ? ? ?Past Surgical History:  ?Procedure Laterality Date  ? IVC FILTER PLACEMENT ([facility] HX)    ? LEG SURGERY    ? PEG TUBE PLACEMENT    ? TRACHEOSTOMY    ? ? ?There were no vitals filed for this visit. ? ? Subjective Assessment - 07/07/21 1447   ? ? Subjective Patient reports his back is feeling much better and states had a good weekend.   ? Patient is accompained by: Family member   ? Pertinent History Paul Hall is a 15yoM who presents with severe weakness, quadriparesis, altered sensorium, and visual impairment s/p critical illness and prolonged hospitalization. Pt hospitalized in October 2020 with ARDS 2/2 COVID19 infection. Pt sustained a complex and lengthy hospitalization which included tracheostomy, prolonged sedation, ECMO. In this period pt sustained CVA and SDH. Pt has now been liberated from tracheostomy and G-tube. Pt has since been hospitalized for wound  infection and UTI. Pt lives with parents at home, has hospital bed and left chair, hoyer lift transfers, and power WC for mobility needs. Pt needs heavy physical assistance with ADL 2/2 BUE contractures and motor dysfunction.   ? Limitations Lifting;Standing;Walking;Writing;Reading;Sitting;House hold activities   ? How long can you sit comfortably? Patient reports uable to sit up long < 4 hours due to buttock discomfort   ? How long can you stand comfortably? Patient is currently unable to stand   ? How long can you walk comfortably? Patient is currently unable to walk   ? Diagnostic tests No recent relavent diagnostic tests   ? Patient Stated Goals I want to be able to possibly walk again   ? Currently in Pain? No/denies   ? ?  ?  ? ?  ? ? ? ? ? ?INTERVENTIONS:  ? ? ? ?Manual stretching: (reclined in power chair)  ?  ?B hamstrings- hold 20 sec x 3 each LE ?B hip circles- Seated - x 15 reps  CW then 25 CCW each LE ?B cal stretch-30 sec hold x 4 each -  ?  ?Therapeutic Exercises:  ?Hip march (AAROM) 3 sets of 10 reps BLE ?Knee ext B LE- 3 sets of 10 reps (AAROM On RLE)  ?Assisted DF on left and Passive on right LE 3 sets of 10 reps each  ?Hamstring curls using GTB  3 sets of 10 reps each LE.  ?Quad sets with 3 sec hold - 2 sets of 10 reps each LE ?Manual resistive Leg press (using PT shoulder) - 2 sets of 10 reps each LE.  ?  ?Education provided throughout session via VC/TC and demonstration to facilitate movement at target joints and correct muscle activation for all testing and exercises performed.  ?  ? ? ? ? ? ? ? ? ? ? ? ? ? ? ? ? ? ? ? ? PT Education - 07/07/21 1448   ? ? Education Details Exercise technique   ? Person(s) Educated Patient   ? Methods Explanation;Demonstration;Tactile cues;Verbal cues;Handout   ? Comprehension Verbalized understanding;Returned demonstration;Verbal cues required;Tactile cues required;Need further instruction   ? ?  ?  ? ?  ? ? ? PT Short Term Goals - 05/10/21 1413   ? ?  ? PT  SHORT TERM GOAL #1  ? Title Pt will be independent with HEP in order to improve strength and balance in order to decrease fall risk and improve function at home and work.   ? Baseline 01/04/2021= No formal HEP in place; 12/12 no HEP in place; 05/10/2021-Patient and his father were able to report compliance with curent HEP consisting of mostly seated/reclined LE strengthening. Both verbalize no questions at this time.   ? Time 6   ? Period Weeks   ? Status Achieved   ? Target Date 02/15/21   ? ?  ?  ? ?  ? ? ? ? PT Long Term Goals - 06/21/21 0848   ? ?  ? PT LONG TERM GOAL #1  ? Title Patient will increase BLE gross strength by 1/2 muscle grade to improve functional strength for improved independence with potential gait, increased standing tolerance and increased ADL ability.   ? Baseline 01/04/2021- Patient presents with 1/5 to 3-/5 B LE strength with MMT; 12/12: goal partially met for Left knee/hip; 05/10/2021= 2-/5 bilateal Hip flex; 3+/5 bilateral Knee ext; 06/21/2021= Patient presents with 2-/5 bilateral Hip flex; 3+/5 bilateral knee ext/flex; 2-/5 left ankle DF; 0/5 right ankle- and able to increase reps and resistance with LE's   ? Time 12   ? Period Weeks   ? Status Partially Met   ? Target Date 03/29/21   ?  ? PT LONG TERM GOAL #2  ? Title Patient will tolerate sitting unsupported demonstrating erect sitting posture for 15 minutes with CGA to demonstrate improved back extensor strength and improved sitting tolerance.   ? Baseline 01/04/2021- Patient confied to sitting in lift chair or electric power chair with back support and unable to sit upright without physical assistance; 12/12: tolerates <1 minutes upright unsupported sitting. 05/10/2021=static sit with forward trunk lean  in his power wheelchair without back support x approx 3 min. 06/21/2021=Unable to assess today due to patient with acute back pain but on previous visit able to sit x 8 min without back support.   ? Time 12   ? Period Weeks   ? Status On-going    ? Target Date 09/13/21   ?  ? PT LONG TERM GOAL #3  ? Title Patient will demonstrate ability to perform static standing in // bars > 2 min with Max Assist  without loss of balance and fair posture for improved overall strength for pre-gait and transfer activities.   ? Baseline 01/04/2021= Patient current uanble to stand- Dependent on hoyer or sit to stand lift for transfers. 05/10/2021=Not appropriate yet- Currently still dependent  with all transfers using hoyer. 06/21/2021= Patient continuing now to focus on LE strengthening to prepare for standing-unable to try today due to acute low back pain-  planning on attempting in new cert period.   ? Time 12   ? Period Weeks   ? Status On-going   ? Target Date 09/13/21   ?  ? PT LONG TERM GOAL #4  ? Title Pt will improve FOTO score by 10 points or more demonstrating improved perceived functional ability   ? Baseline FOTO 7 on 10/17; 03/15/21: FOTO 12; 05/10/2021 06/21/2021= 1   ? Time 12   ? Period Weeks   ? Status On-going   ? Target Date 03/29/21   ? ?  ?  ? ?  ? ? ? ? ? ? ? ? Plan - 07/07/21 1449   ? ? Clinical Impression Statement Patient presented with good motivation and painfree during visit today. He was able to achieve more overall reps and reported no pain with stretching/Exercising today. Patient able to exhibit almost full of range of motion of right quads and able to perform manual leg press well today.  Pt will continue to benefit from skilled PT intervention to improve his overall function, weight shifting and postiion change ability, LE strength, trunk strength and improve his overall quality of life   ? Personal Factors and Comorbidities Comorbidity 3+;Time since onset of injury/illness/exacerbation   ? Comorbidities CVA, diabetes, Seizures   ? Examination-Activity Limitations Bathing;Bed Mobility;Bend;Caring for Others;Carry;Dressing;Hygiene/Grooming;Lift;Locomotion Level;Reach Overhead;Self Feeding;Sit;Squat;Stairs;Stand;Transfers;Toileting   ?  Examination-Participation Restrictions Cleaning;Community Activity;Driving;Laundry;Medication Management;Meal Prep;Occupation;Personal Finances;Shop;Yard Work;Volunteer   ? Stability/Clinical Decision Making Evolving/Moderate

## 2021-07-08 DIAGNOSIS — M24531 Contracture, right wrist: Secondary | ICD-10-CM | POA: Diagnosis not present

## 2021-07-08 DIAGNOSIS — Z7901 Long term (current) use of anticoagulants: Secondary | ICD-10-CM | POA: Diagnosis not present

## 2021-07-08 DIAGNOSIS — M24542 Contracture, left hand: Secondary | ICD-10-CM | POA: Diagnosis not present

## 2021-07-08 DIAGNOSIS — E119 Type 2 diabetes mellitus without complications: Secondary | ICD-10-CM | POA: Diagnosis not present

## 2021-07-08 DIAGNOSIS — M24532 Contracture, left wrist: Secondary | ICD-10-CM | POA: Diagnosis not present

## 2021-07-08 DIAGNOSIS — Z8673 Personal history of transient ischemic attack (TIA), and cerebral infarction without residual deficits: Secondary | ICD-10-CM | POA: Diagnosis not present

## 2021-07-08 DIAGNOSIS — M24541 Contracture, right hand: Secondary | ICD-10-CM | POA: Diagnosis not present

## 2021-07-08 DIAGNOSIS — I82413 Acute embolism and thrombosis of femoral vein, bilateral: Secondary | ICD-10-CM | POA: Diagnosis not present

## 2021-07-08 DIAGNOSIS — Z8616 Personal history of COVID-19: Secondary | ICD-10-CM | POA: Diagnosis not present

## 2021-07-08 DIAGNOSIS — Z86718 Personal history of other venous thrombosis and embolism: Secondary | ICD-10-CM | POA: Diagnosis not present

## 2021-07-08 DIAGNOSIS — Z95828 Presence of other vascular implants and grafts: Secondary | ICD-10-CM | POA: Diagnosis not present

## 2021-07-08 DIAGNOSIS — I69398 Other sequelae of cerebral infarction: Secondary | ICD-10-CM | POA: Diagnosis not present

## 2021-07-08 DIAGNOSIS — R2689 Other abnormalities of gait and mobility: Secondary | ICD-10-CM | POA: Diagnosis not present

## 2021-07-08 DIAGNOSIS — Z7982 Long term (current) use of aspirin: Secondary | ICD-10-CM | POA: Diagnosis not present

## 2021-07-08 DIAGNOSIS — Z6841 Body Mass Index (BMI) 40.0 and over, adult: Secondary | ICD-10-CM | POA: Diagnosis not present

## 2021-07-08 NOTE — Therapy (Signed)
[place] [facility] ?[place], [place], [address] ?Phone: [phone]   Fax:  [phone] ? ?Occupational Therapy Treatment ? ?Patient Details  ?Name: Paul Hall ?MRN: [id number] ?Date of Birth: [birth year] ?Referring Provider (OT): Collene Mares ? ? ?Encounter Date: 07/07/2021 ? ? OT End of Session - 07/08/21 0912   ? ? Visit Number 42   ? Number of Visits 72   ? Date for OT Re-Evaluation 09/08/21   ? Authorization Type Progress report period starting 03/17/2021   ? OT Start Time 1300   ? OT Stop Time 1345   ? OT Time Calculation (min) 45 min   ? Activity Tolerance Patient tolerated treatment well   ? Behavior During Therapy [facility] for tasks assessed/performed   ? ?  ?  ? ?  ? ? ?Past Medical History:  ?Diagnosis Date  ? Diabetes mellitus ([place])   ? Hypertension   ? Stroke [facility])   ? ? ?Past Surgical History:  ?Procedure Laterality Date  ? IVC FILTER PLACEMENT ([facility] HX)    ? LEG SURGERY    ? PEG TUBE PLACEMENT    ? TRACHEOSTOMY    ? ? ?There were no vitals filed for this visit. ? ? Subjective Assessment - 07/08/21 0912   ? ? Subjective  Pt. reports doing well today   ? Patient is accompanied by: Family member   ? Pertinent History Pt. is a 26 y.o. male who was hospitalized, and diagnosed with COVID-19, sustained a CVA with resultant quadreplegia in 01/2019. Pt. was hospitalized with VRE  UTI. Pt. PMHx includes: urinary retention, Seizure Disorder, Obstructive Sleep Disorder, DM Type II, and Morbid obesity. Pt. resides at home with his parents.   ? Currently in Pain? No/denies   ? ?  ?  ? ?  ? ?OT TREATMENT   ?  ?Estim attended: ?  ?Pt. tolerated neuromuscular estim  for cycle time 10/10,  duty cycle 50%. Applied to the left wrist, and digit extensors for 8 min., and the right wrist, and digit extension for 5 min.  on the left with the intensity set to 44, and constant monitoring 100% of the time. Manual assist was required for wrist and digit  extension with each rep. To hold the position.  ?  ?Therapeutic Exercise: ?  ?Pt. tolerated bilateral wrist flexion, extension, and MP, PIP, and DIP flexion, and extension without reports of pain this afternoon. Patient tolerated moist heat modality for 5 min. to his bilateral hands prior to range of motion.  ?  ?Pt. is scheduled for an appointment at [facility] early tomorrow morning to check his blood work. Pt. Is scheduled for Botox injections next month. Pt. Tolerated ROM well in the digits today. Pt. continues to benefit from OT services  to improve ROM, and facilitate active volitional movement in order to work towards engaging his BUEs in daily ADLs, and IADL tasks.  ?  ? ? ? ? ? ? ? ? ? ? ? ? ? ? ? ? ? ? ? ? OT Education - 07/08/21 0912   ? ? Education Details BUE AROM   ? Person(s) Educated Patient   ? Methods Verbal cues;Tactile cues   ? Comprehension Verbalized understanding;Verbal cues required;Need further instruction;Returned demonstration   ? ?  ?  ? ?  ? ? ? ? ? ? OT Long Term Goals - 06/23/21 1555   ? ?  ? OT LONG TERM GOAL #1  ? Title  Pt. will improve FOTO score by 5 points for clinically significant improvements during ADLs, and IADLs,   ? Baseline 30th visit: FOTO: 37, 21st: FOTO 43. 10th Visit: FOTO score 42 Eval: FOTO score: 41 FOTO score: 39   ? Time 12   ? Period Weeks   ? Status On-going   ? Target Date 09/08/21   ?  ? OT LONG TERM GOAL #3  ? Title Pt. will improve right Abduction by 10 degrees to assist caregivers with underarm hygiene care.   ? Baseline Eval: AROM abduction RUE 76. Pt. continues to present with limited bilateral abduction. 21st: AROM abduction RUE 80.06/16/2021: Right shulder abduction 108. 06/23/2021: Right shoulder abduction: 110   ? Time 12   ? Period Weeks   ? Status On-going   ? Target Date 09/08/21   ?  ? OT LONG TERM GOAL #4  ? Title Pt. will demonstrate visual compensatory strategies for navigating through his environment.   ? Baseline 30th visit: Pt. continues to require  assist to navigate through his environment. 20th: Pt continues to require assist to navigate environment, minimal knowledge of strategies. Eval: Limited 06/23/2021: Pt. continues to be limited, and requires assist  with navigating through his environment.   ? Time 12   ? Period Weeks   ? Status On-going   ? Target Date 09/08/21   ?  ? OT LONG TERM GOAL #5  ? Title Pt. will demonstrate visual compensatory strategies for tabletop tasks within his near space.   ? Baseline 06/16/2021: Pt. continues to present with visual limitations for tabletop tasks in his near space. Pt. continues to present with limited vision. 21st: Pt can identify objects on tabletop ~waterbottle sized, improves with contrasting colours, cannot ID small onjects   ? Time 12   ? Period Weeks   ? Status On-going   ? Target Date 09/08/21   ?  ? OT LONG TERM GOAL #6  ? Title Pt. will perform hand to mouth patterns with supervision in preparation for self-feeding.   ? Baseline 06/23/2021: Pt. is able to use his bilateral hands to perfrom hand to mouth patterns for self-feeding with finger foods. Pt. is now able to use his left hand to bring a water bottle to his mouth,a nd drink from it. 30th visit: Pt. continues to present with weakness limiting functional hand to mouth patterns of movement. Pt. is able to perform hand to mouth patterns, however reports weakness in his hand  limits a secure grasp on the utensil. Pt. requires assist to perform. Eval: Pt. has difficulty.   ? Time 12   ? Period Weeks   ? Target Date 09/08/21   ?  ? OT LONG TERM GOAL #7  ? Title Pt. will perform one step self-grooming tasks with minA.   ? Baseline 06/23/2021: Pt. is able to perform the hand to face pattern with the left hand, however has difficulty actively sustaining his BUEs in elevation long enough to perform self-grooming tasks. 06/16/2021: Pt. is able to perform the hand to face pattern with the left hand, however has difficulty actively sustaining his BUEs in elevation  long enough to perform self-grooming tasks.   ? Time 12   ? Period Weeks   ? Status On-going   ? Target Date 09/08/21   ?  ? OT LONG TERM GOAL #8  ? Title Pt will complete UB dressing tasks with SETUP and assist to thread over hands only   ? Baseline 06/23/2021: Pt. requires  MinA to thread arms, and requires maxA to donn over upper arms, MaxAx2 to donn over his back. 06/16/2021: Pt. has progressed  to require MinA to thread arms, and requires maxA to donn over upper arms, MaxAx2 to donn over his back.   ? Time 12   ? Period Weeks   ? Status On-going   ? Target Date 09/08/21   ? ?  ?  ? ?  ? ? ? ? ? ? ? ? Plan - 07/08/21 0913   ? ? Clinical Impression Statement Pt. is scheduled for an appointment at [facility] early tomorrow morning to check his blood work. Pt. Is scheduled for Botox injections next month. Pt. Tolerated ROM well in the digits today. Pt. continues to benefit from OT services  to improve ROM, and facilitate active volitional movement in order to work towards engaging his BUEs in daily ADLs, and IADL tasks.  ?   ? OT Occupational Profile and History Detailed Assessment- Review of Records and additional review of physical, cognitive, psychosocial history related to current functional performance   ? Occupational performance deficits (Please refer to evaluation for details): ADL's;IADL's   ? Body Structure / Function / Physical Skills ADL;FMC;ROM;Dexterity;IADL;Endurance;Obesity;Strength;UE functional use   ? Rehab Potential Good   ? Clinical Decision Making Multiple treatment options, significant modification of task necessary   ? Comorbidities Affecting Occupational Performance: Presence of comorbidities impacting occupational performance   ? Modification or Assistance to Complete Evaluation  Max significant modification of tasks or assist is necessary to complete   ? OT Frequency 2x / week   ? OT Duration 12 weeks   ? OT Treatment/Interventions Self-care/ADL training;Therapeutic exercise;Neuromuscular  education;Visual/perceptual remediation/compensation;Patient/family education;Therapeutic activities;DME and/or AE instruction;Passive range of motion;Moist Heat;Electrical Stimulation   ? Consulted and Agree with Plan o

## 2021-07-12 ENCOUNTER — Ambulatory Visit: Payer: BC Managed Care – PPO | Admitting: Occupational Therapy

## 2021-07-12 ENCOUNTER — Ambulatory Visit: Payer: BC Managed Care – PPO

## 2021-07-12 DIAGNOSIS — R2689 Other abnormalities of gait and mobility: Secondary | ICD-10-CM

## 2021-07-12 DIAGNOSIS — R269 Unspecified abnormalities of gait and mobility: Secondary | ICD-10-CM

## 2021-07-12 DIAGNOSIS — R278 Other lack of coordination: Secondary | ICD-10-CM | POA: Diagnosis not present

## 2021-07-12 DIAGNOSIS — M6281 Muscle weakness (generalized): Secondary | ICD-10-CM

## 2021-07-12 DIAGNOSIS — R262 Difficulty in walking, not elsewhere classified: Secondary | ICD-10-CM

## 2021-07-12 DIAGNOSIS — I6930 Unspecified sequelae of cerebral infarction: Secondary | ICD-10-CM

## 2021-07-12 DIAGNOSIS — R2681 Unsteadiness on feet: Secondary | ICD-10-CM

## 2021-07-12 NOTE — Therapy (Signed)
[place] [facility] ?[place], [place], [address] ?Phone: [phone]   Fax:  [phone] ? ?Physical Therapy Treatment ? ?Patient Details  ?Name: Paul Hall ?MRN: [id number] ?Date of Birth: [birth year] ?Referring Provider (PT): Collene Mares, PA-C ? ? ?Encounter Date: 07/12/2021 ? ? PT End of Session - 07/12/21 1654   ? ? Visit Number 34   ? Number of Visits 37   ? Date for PT Re-Evaluation 09/13/21   ? Authorization Type [employer] and [employer]- Need auth past 12th visit   ? Authorization Time Period 01/04/21-03/29/21; Recert [id number]/26/[id number]   ? Progress Note Due on Visit 40   ? PT Start Time 1348   ? PT Stop Time 1430   ? PT Time Calculation (min) 42 min   ? Equipment Utilized During Treatment --   [employer] lift  ? Activity Tolerance Patient tolerated treatment well   queasiness/nausea  ? Behavior During Therapy [facility] for tasks assessed/performed   ? ?  ?  ? ?  ? ? ?Past Medical History:  ?Diagnosis Date  ? Diabetes mellitus ([place])   ? Hypertension   ? Stroke [facility])   ? ? ?Past Surgical History:  ?Procedure Laterality Date  ? IVC FILTER PLACEMENT ([facility] HX)    ? LEG SURGERY    ? PEG TUBE PLACEMENT    ? TRACHEOSTOMY    ? ? ?There were no vitals filed for this visit. ? ? Subjective Assessment - 07/12/21 1656   ? ? Subjective Patient reports doing well today and agreeable to sitting at edge of mat today.   ? Patient is accompained by: Family member   ? Pertinent History Paul Hall is a 22yoM who presents with severe weakness, quadriparesis, altered sensorium, and visual impairment s/p critical illness and prolonged hospitalization. Pt hospitalized in October 2020 with ARDS 2/2 COVID19 infection. Pt sustained a complex and lengthy hospitalization which included tracheostomy, prolonged sedation, ECMO. In this period pt sustained CVA and SDH. Pt has now been liberated from tracheostomy and G-tube. Pt has since been hospitalized for wound  infection and UTI. Pt lives with parents at home, has hospital bed and left chair, hoyer lift transfers, and power WC for mobility needs. Pt needs heavy physical assistance with ADL 2/2 BUE contractures and motor dysfunction.   ? Limitations Lifting;Standing;Walking;Writing;Reading;Sitting;House hold activities   ? How long can you sit comfortably? Patient reports uable to sit up long < 4 hours due to buttock discomfort   ? How long can you stand comfortably? Patient is currently unable to stand   ? How long can you walk comfortably? Patient is currently unable to walk   ? Diagnostic tests No recent relavent diagnostic tests   ? Patient Stated Goals I want to be able to possibly walk again   ? Currently in Pain? No/denies   ? ?  ?  ? ?  ? ?INTERVENTIONS:  ? ?Therapeutic Activity: Hoyer transfer from power w/c to mat table using 2 person assist- No issued today using green strap loops for all 4 loops.  ? ?Supine with use of wedge and 2 pillows behind patient - Mod assist into sitting at edge of mat. Then once ready to transfer back - mod assist from sit to modified supine with legs hanging off side of mat.  ? ?Static sit and EOM x approx 6 min- able to talk/converse with his dad and PT without UE support- exhibiting good trunk control with no falling over  to side or back.  ? ?While sitting at [facility]- patient performed LAQ; forward trunk lean x 10 reps each followed more static sitting. At 15 min mark patient became tired and back uncomforable and requested to lay back down.  ? ?Therex: ? ?Seated hip march - AAROM - 20 reps ?Seated knee ext - AAROM on right and active on right x 20 reps.  ?Seated manual resist hip add/abd x 20 reps each.  ? ?Education provided throughout session via VC/TC and demonstration to facilitate movement at target joints and correct muscle activation for all testing and exercises performed.  ? ? ? ? ? ? ? ? ? ? ? ? ? ? ? ? ? ? ? ? ? ? ? ? ? ? ? PT Education - 07/12/21 1657   ? ? Education Details  Exercise technique   ? Person(s) Educated Patient   ? Methods Explanation;Tactile cues;Demonstration;Verbal cues   ? Comprehension Verbalized understanding;Returned demonstration;Verbal cues required;Tactile cues required;Need further instruction   ? ?  ?  ? ?  ? ? ? PT Short Term Goals - 05/10/21 1413   ? ?  ? PT SHORT TERM GOAL #1  ? Title Pt will be independent with HEP in order to improve strength and balance in order to decrease fall risk and improve function at home and work.   ? Baseline 01/04/2021= No formal HEP in place; 12/12 no HEP in place; 05/10/2021-Patient and his father were able to report compliance with curent HEP consisting of mostly seated/reclined LE strengthening. Both verbalize no questions at this time.   ? Time 6   ? Period Weeks   ? Status Achieved   ? Target Date 02/15/21   ? ?  ?  ? ?  ? ? ? ? PT Long Term Goals - 06/21/21 0848   ? ?  ? PT LONG TERM GOAL #1  ? Title Patient will increase BLE gross strength by 1/2 muscle grade to improve functional strength for improved independence with potential gait, increased standing tolerance and increased ADL ability.   ? Baseline 01/04/2021- Patient presents with 1/5 to 3-/5 B LE strength with MMT; 12/12: goal partially met for Left knee/hip; 05/10/2021= 2-/5 bilateal Hip flex; 3+/5 bilateral Knee ext; 06/21/2021= Patient presents with 2-/5 bilateral Hip flex; 3+/5 bilateral knee ext/flex; 2-/5 left ankle DF; 0/5 right ankle- and able to increase reps and resistance with LE's   ? Time 12   ? Period Weeks   ? Status Partially Met   ? Target Date 03/29/21   ?  ? PT LONG TERM GOAL #2  ? Title Patient will tolerate sitting unsupported demonstrating erect sitting posture for 15 minutes with CGA to demonstrate improved back extensor strength and improved sitting tolerance.   ? Baseline 01/04/2021- Patient confied to sitting in lift chair or electric power chair with back support and unable to sit upright without physical assistance; 12/12: tolerates <1 minutes  upright unsupported sitting. 05/10/2021=static sit with forward trunk lean  in his power wheelchair without back support x approx 3 min. 06/21/2021=Unable to assess today due to patient with acute back pain but on previous visit able to sit x 8 min without back support.   ? Time 12   ? Period Weeks   ? Status On-going   ? Target Date 09/13/21   ?  ? PT LONG TERM GOAL #3  ? Title Patient will demonstrate ability to perform static standing in // bars > 2 min with Max Assist  without loss of balance and fair posture for improved overall strength for pre-gait and transfer activities.   ? Baseline 01/04/2021= Patient current uanble to stand- Dependent on hoyer or sit to stand lift for transfers. 05/10/2021=Not appropriate yet- Currently still dependent with all transfers using hoyer. 06/21/2021= Patient continuing now to focus on LE strengthening to prepare for standing-unable to try today due to acute low back pain-  planning on attempting in new cert period.   ? Time 12   ? Period Weeks   ? Status On-going   ? Target Date 09/13/21   ?  ? PT LONG TERM GOAL #4  ? Title Pt will improve FOTO score by 10 points or more demonstrating improved perceived functional ability   ? Baseline FOTO 7 on 10/17; 03/15/21: FOTO 12; 05/10/2021 06/21/2021= 1   ? Time 12   ? Period Weeks   ? Status On-going   ? Target Date 03/29/21   ? ?  ?  ? ?  ? ? ? ? ? ? ? ? Plan - 07/12/21 1653   ? ? Clinical Impression Statement Patient presents with good motivation for today's session. Able to dependently hoyer patient without any difficulty with 2 person assist. He was able to demonstrate improved overall sitting posture and endurance today as seen by 15 min of not touching with UE support. Patient was pleased yet tired with his ability today to sit for prolonged time without UE support. Pt will continue to benefit from skilled PT intervention to improve his overall function, weight shifting and postiion change ability, LE strength, trunk strength and improve  his overall quality of life   ? Personal Factors and Comorbidities Comorbidity 3+;Time since onset of injury/illness/exacerbation   ? Comorbidities CVA, diabetes, Seizures   ? Examination-Activity Limitat

## 2021-07-13 ENCOUNTER — Encounter: Payer: Self-pay | Admitting: Occupational Therapy

## 2021-07-13 NOTE — Therapy (Signed)
[place] [facility] ?[address] ?[place], [place], [address] ?Phone: [phone]   Fax:  [phone] ? ?Occupational Therapy Treatment ? ?Patient Details  ?Name: Paul Hall ?MRN: [id number] ?Date of Birth: 06/27/1995 ?Referring Provider (OT): Lenise Herald ? ? ?Encounter Date: 07/12/2021 ? ? OT End of Session - 07/13/21 0918   ? ? Visit Number 43   ? Number of Visits 72   ? Date for OT Re-Evaluation 09/08/21   ? OT Start Time 1300   ? OT Stop Time 1345   ? OT Time Calculation (min) 45 min   ? Activity Tolerance Patient tolerated treatment well   ? Behavior During Therapy [facility] for tasks assessed/performed   ? ?  ?  ? ?  ? ? ?Past Medical History:  ?Diagnosis Date  ? Diabetes mellitus ([facility])   ? Hypertension   ? Stroke [facility])   ? ? ?Past Surgical History:  ?Procedure Laterality Date  ? IVC FILTER PLACEMENT ([facility] HX)    ? LEG SURGERY    ? PEG TUBE PLACEMENT    ? TRACHEOSTOMY    ? ? ?There were no vitals filed for this visit. ? ? Subjective Assessment - 07/13/21 0916   ? ? Subjective  Pt. reports doing well today.   ? Patient is accompanied by: Family member   ? Pertinent History Pt. is a 26 y.o. male who was hospitalized, and diagnosed with COVID-19, sustained a CVA with resultant quadreplegia in 01/2019. Pt. was hospitalized with VRE  UTI. Pt. PMHx includes: urinary retention, Seizure Disorder, Obstructive Sleep Disorder, DM Type II, and Morbid obesity. Pt. resides at home with his parents.   ? Currently in Pain? Yes   ? Pain Score 8    ? Pain Location Finger (Comment which one)   left 4th digit  ? ?  ?  ? ?  ? ?OT TREATMENT   ?  ?Estim attended: ?  ?Pt. tolerated neuromuscular estim  for cycle time 10/10,  duty cycle 50%. Applied to the left wrist, and digit extensors for 8 min., and the right wrist, and digit extension for 5 min.  on the left with the intensity set to 44, and constant monitoring 100% of the time. Manual assist was required for wrist and digit  extension with each rep. To hold the position.  ? ?Manual Therapy: ? ?Pt. Tolerated soft tissue massage to the dorsal, and volar surface of the right 4th digit 2/2 pain, and stiffness. Manual therapy was performed independent of,a nd in preparation for ROM and therapeutic Ex.  ?  ?Therapeutic Exercise: ?  ?Pt. tolerated bilateral wrist flexion, extension, and MP, PIP, and DIP flexion, and extension without reports of pain this afternoon. Patient tolerated moist heat modality for 5 min. to his bilateral hands prior to range of motion.  ?  ?Pt. Is scheduled for Botox injections next month. Pt. Tolerated manual therapy, and ROM well in the digits today with 8/10 pain initially in the right 4th digit, which decreased following treated. Pt. continues to benefit from OT services  to improve ROM, and facilitate active volitional movement in order to work towards engaging his BUEs in daily ADLs, and IADL tasks.  ?  ?  ?  ?  ? ? ? ? ? ? ? ? ? ? ? ? ? ? ? ? ? ? ? ? ? OT Education - 07/13/21 0917   ? ? Education Details PROM bilateral hand/digits   ? Person(s)  Educated Patient   ? Methods Verbal cues;Tactile cues   ? Comprehension Verbalized understanding;Verbal cues required;Need further instruction;Returned demonstration   ? ?  ?  ? ?  ? ? ? ? ? ? OT Long Term Goals - 06/23/21 1555   ? ?  ? OT LONG TERM GOAL #1  ? Title Pt. will improve FOTO score by 5 points for clinically significant improvements during ADLs, and IADLs,   ? Baseline 30th visit: FOTO: 37, 21st: FOTO 43. 10th Visit: FOTO score 42 Eval: FOTO score: 41 FOTO score: 39   ? Time 12   ? Period Weeks   ? Status On-going   ? Target Date 09/08/21   ?  ? OT LONG TERM GOAL #3  ? Title Pt. will improve right Abduction by 10 degrees to assist caregivers with underarm hygiene care.   ? Baseline Eval: AROM abduction RUE 76. Pt. continues to present with limited bilateral abduction. 21st: AROM abduction RUE 80.06/16/2021: Right shulder abduction 108. 06/23/2021: Right  shoulder abduction: 110   ? Time 12   ? Period Weeks   ? Status On-going   ? Target Date 09/08/21   ?  ? OT LONG TERM GOAL #4  ? Title Pt. will demonstrate visual compensatory strategies for navigating through his environment.   ? Baseline 30th visit: Pt. continues to require assist to navigate through his environment. 20th: Pt continues to require assist to navigate environment, minimal knowledge of strategies. Eval: Limited 06/23/2021: Pt. continues to be limited, and requires assist  with navigating through his environment.   ? Time 12   ? Period Weeks   ? Status On-going   ? Target Date 09/08/21   ?  ? OT LONG TERM GOAL #5  ? Title Pt. will demonstrate visual compensatory strategies for tabletop tasks within his near space.   ? Baseline 06/16/2021: Pt. continues to present with visual limitations for tabletop tasks in his near space. Pt. continues to present with limited vision. 21st: Pt can identify objects on tabletop ~waterbottle sized, improves with contrasting colours, cannot ID small onjects   ? Time 12   ? Period Weeks   ? Status On-going   ? Target Date 09/08/21   ?  ? OT LONG TERM GOAL #6  ? Title Pt. will perform hand to mouth patterns with supervision in preparation for self-feeding.   ? Baseline 06/23/2021: Pt. is able to use his bilateral hands to perfrom hand to mouth patterns for self-feeding with finger foods. Pt. is now able to use his left hand to bring a water bottle to his mouth,a nd drink from it. 30th visit: Pt. continues to present with weakness limiting functional hand to mouth patterns of movement. Pt. is able to perform hand to mouth patterns, however reports weakness in his hand  limits a secure grasp on the utensil. Pt. requires assist to perform. Eval: Pt. has difficulty.   ? Time 12   ? Period Weeks   ? Target Date 09/08/21   ?  ? OT LONG TERM GOAL #7  ? Title Pt. will perform one step self-grooming tasks with minA.   ? Baseline 06/23/2021: Pt. is able to perform the hand to face  pattern with the left hand, however has difficulty actively sustaining his BUEs in elevation long enough to perform self-grooming tasks. 06/16/2021: Pt. is able to perform the hand to face pattern with the left hand, however has difficulty actively sustaining his BUEs in elevation long enough to perform self-grooming  tasks.   ? Time 12   ? Period Weeks   ? Status On-going   ? Target Date 09/08/21   ?  ? OT LONG TERM GOAL #8  ? Title Pt will complete UB dressing tasks with SETUP and assist to thread over hands only   ? Baseline 06/23/2021: Pt. requires MinA to thread arms, and requires maxA to donn over upper arms, MaxAx2 to donn over his back. 06/16/2021: Pt. has progressed  to require MinA to thread arms, and requires maxA to donn over upper arms, MaxAx2 to donn over his back.   ? Time 12   ? Period Weeks   ? Status On-going   ? Target Date 09/08/21   ? ?  ?  ? ?  ? ? ? ? ? ? ? ? Plan - 07/13/21 0918   ? ? Clinical Impression Statement Pt. Is scheduled for Botox injections next month. Pt. Tolerated manual therapy, and ROM well in the digits today with 8/10 pain initially in the right 4th digit, which decreased following treated. Pt. continues to benefit from OT services  to improve ROM, and facilitate active volitional movement in order to work towards engaging his BUEs in daily ADLs, and IADL tasks.  ?  ?   ? OT Occupational Profile and History Detailed Assessment- Review of Records and additional review of physical, cognitive, psychosocial history related to current functional performance   ? Occupational performance deficits (Please refer to evaluation for details): ADL's;IADL's   ? Body Structure / Function / Physical Skills ADL;FMC;ROM;Dexterity;IADL;Endurance;Obesity;Strength;UE functional use   ? Rehab Potential Good   ? Clinical Decision Making Multiple treatment options, significant modification of task necessary   ? Comorbidities Affecting Occupational Performance: Presence of comorbidities impacting  occupational performance   ? Modification or Assistance to Complete Evaluation  Max significant modification of tasks or assist is necessary to complete   ? OT Frequency 2x / week   ? OT Duration 12 weeks   ? OT Treatme

## 2021-07-14 ENCOUNTER — Ambulatory Visit: Payer: BC Managed Care – PPO

## 2021-07-14 DIAGNOSIS — R262 Difficulty in walking, not elsewhere classified: Secondary | ICD-10-CM

## 2021-07-14 DIAGNOSIS — R2681 Unsteadiness on feet: Secondary | ICD-10-CM

## 2021-07-14 DIAGNOSIS — R269 Unspecified abnormalities of gait and mobility: Secondary | ICD-10-CM

## 2021-07-14 DIAGNOSIS — M6281 Muscle weakness (generalized): Secondary | ICD-10-CM

## 2021-07-14 DIAGNOSIS — R278 Other lack of coordination: Secondary | ICD-10-CM

## 2021-07-14 DIAGNOSIS — I6930 Unspecified sequelae of cerebral infarction: Secondary | ICD-10-CM

## 2021-07-14 DIAGNOSIS — R2689 Other abnormalities of gait and mobility: Secondary | ICD-10-CM | POA: Diagnosis not present

## 2021-07-14 NOTE — Therapy (Signed)
[place] [facility] ?[place], [place], [address] ?Phone: [phone]   Fax:  [phone] ? ?Physical Therapy Treatment ? ?Patient Details  ?Name: Luiz Trumpower ?MRN: [id number] ?Date of Birth: [birth year] ?Referring Provider (PT): Collene Mares, PA-C ? ? ?Encounter Date: 07/14/2021 ? ? PT End of Session - 07/14/21 1452   ? ? Visit Number 35   ? Number of Visits 37   ? Date for PT Re-Evaluation 09/13/21   ? Authorization Type [employer] and [employer]- Need auth past 12th visit   ? Authorization Time Period 01/04/21-03/29/21; Recert [id number]/[id number]   ? Progress Note Due on Visit 40   ? PT Start Time 1435   ? PT Stop Time 9480   ? PT Time Calculation (min) 39 min   ? Equipment Utilized During Treatment --   [employer] lift  ? Activity Tolerance Patient tolerated treatment well   queasiness/nausea  ? Behavior During Therapy [facility] for tasks assessed/performed   ? ?  ?  ? ?  ? ? ?Past Medical History:  ?Diagnosis Date  ? Diabetes mellitus ([place])   ? Hypertension   ? Stroke [facility])   ? ? ?Past Surgical History:  ?Procedure Laterality Date  ? IVC FILTER PLACEMENT ([facility] HX)    ? LEG SURGERY    ? PEG TUBE PLACEMENT    ? TRACHEOSTOMY    ? ? ?There were no vitals filed for this visit. ? ? Subjective Assessment - 07/14/21 1452   ? ? Subjective Patient reports not feeling well today dealing with some stomach issues. Agreeable to seated LE strengthening   ? Patient is accompained by: Family member   ? Pertinent History Salvatore Shear is a 49yoM who presents with severe weakness, quadriparesis, altered sensorium, and visual impairment s/p critical illness and prolonged hospitalization. Pt hospitalized in October 2020 with ARDS 2/2 COVID19 infection. Pt sustained a complex and lengthy hospitalization which included tracheostomy, prolonged sedation, ECMO. In this period pt sustained CVA and SDH. Pt has now been liberated from tracheostomy and G-tube. Pt has since  been hospitalized for wound infection and UTI. Pt lives with parents at home, has hospital bed and left chair, hoyer lift transfers, and power WC for mobility needs. Pt needs heavy physical assistance with ADL 2/2 BUE contractures and motor dysfunction.   ? Limitations Lifting;Standing;Walking;Writing;Reading;Sitting;House hold activities   ? How long can you sit comfortably? Patient reports uable to sit up long < 4 hours due to buttock discomfort   ? How long can you stand comfortably? Patient is currently unable to stand   ? How long can you walk comfortably? Patient is currently unable to walk   ? Diagnostic tests No recent relavent diagnostic tests   ? Patient Stated Goals I want to be able to possibly walk again   ? Currently in Pain? No/denies   ? ?  ?  ? ?  ? ? ? ? ?INTERVENTIONS:  ? ?*patient reported being nauseated  ? ? ?Manual stretching: (seated in power chair)  ?  ?B hamstrings- hold 20 sec x 3 each LE ?B hip circles- Seated - x 15 reps  CW/CCW ?B hip IR to stretch ER- hold 30 sec x 3 each leg ?B cal stretch-30 sec hold x 4 each -  ?  ?Therapeutic Exercises:  ?Hip march (AAROM) 20 reps BLE ?Knee ext B LE- 20 reps Each LE; 3lb on left (AAROM On RLE)  ?Assisted DF on left and Passive on  right LE 20 reps each  ?Hamstring curls using GTB 2 sets of 15 reps reps each LE.  ?Quad sets with 3 sec hold - 20 reps each LE ?Manual resistive hip add with 3 sec hold x 20 reps each LE.  ?Manual resistive Hip abd with 3 sec hold x 20 reps each LE ?*Brief rest breaks provided due to patient not feeling well.  ?  ?Education provided throughout session via VC/TC and demonstration to facilitate movement at target joints and correct muscle activation for all testing and exercises performed.  ? ? ? ? ? ? ? ? ? ? ? ? ? ? ? ? ? ? ? ? PT Education - 07/15/21 1451   ? ? Education Details Exercise technique   ? Person(s) Educated Patient   ? Methods Explanation;Demonstration;Tactile cues;Verbal cues   ? Comprehension Verbalized  understanding;Tactile cues required;Verbal cues required;Returned demonstration;Need further instruction   ? ?  ?  ? ?  ? ? ? PT Short Term Goals - 05/10/21 1413   ? ?  ? PT SHORT TERM GOAL #1  ? Title Pt will be independent with HEP in order to improve strength and balance in order to decrease fall risk and improve function at home and work.   ? Baseline 01/04/2021= No formal HEP in place; 12/12 no HEP in place; 05/10/2021-Patient and his father were able to report compliance with curent HEP consisting of mostly seated/reclined LE strengthening. Both verbalize no questions at this time.   ? Time 6   ? Period Weeks   ? Status Achieved   ? Target Date 02/15/21   ? ?  ?  ? ?  ? ? ? ? PT Long Term Goals - 06/21/21 0848   ? ?  ? PT LONG TERM GOAL #1  ? Title Patient will increase BLE gross strength by 1/2 muscle grade to improve functional strength for improved independence with potential gait, increased standing tolerance and increased ADL ability.   ? Baseline 01/04/2021- Patient presents with 1/5 to 3-/5 B LE strength with MMT; 12/12: goal partially met for Left knee/hip; 05/10/2021= 2-/5 bilateal Hip flex; 3+/5 bilateral Knee ext; 06/21/2021= Patient presents with 2-/5 bilateral Hip flex; 3+/5 bilateral knee ext/flex; 2-/5 left ankle DF; 0/5 right ankle- and able to increase reps and resistance with LE's   ? Time 12   ? Period Weeks   ? Status Partially Met   ? Target Date 03/29/21   ?  ? PT LONG TERM GOAL #2  ? Title Patient will tolerate sitting unsupported demonstrating erect sitting posture for 15 minutes with CGA to demonstrate improved back extensor strength and improved sitting tolerance.   ? Baseline 01/04/2021- Patient confied to sitting in lift chair or electric power chair with back support and unable to sit upright without physical assistance; 12/12: tolerates <1 minutes upright unsupported sitting. 05/10/2021=static sit with forward trunk lean  in his power wheelchair without back support x approx 3 min.  06/21/2021=Unable to assess today due to patient with acute back pain but on previous visit able to sit x 8 min without back support.   ? Time 12   ? Period Weeks   ? Status On-going   ? Target Date 09/13/21   ?  ? PT LONG TERM GOAL #3  ? Title Patient will demonstrate ability to perform static standing in // bars > 2 min with Max Assist  without loss of balance and fair posture for improved overall strength for pre-gait and  transfer activities.   ? Baseline 01/04/2021= Patient current uanble to stand- Dependent on hoyer or sit to stand lift for transfers. 05/10/2021=Not appropriate yet- Currently still dependent with all transfers using hoyer. 06/21/2021= Patient continuing now to focus on LE strengthening to prepare for standing-unable to try today due to acute low back pain-  planning on attempting in new cert period.   ? Time 12   ? Period Weeks   ? Status On-going   ? Target Date 09/13/21   ?  ? PT LONG TERM GOAL #4  ? Title Pt will improve FOTO score by 10 points or more demonstrating improved perceived functional ability   ? Baseline FOTO 7 on 10/17; 03/15/21: FOTO 12; 05/10/2021 06/21/2021= 1   ? Time 12   ? Period Weeks   ? Status On-going   ? Target Date 03/29/21   ? ?  ?  ? ?  ? ? ? ? ? ? ? ? Plan - 07/14/21 1452   ? ? Clinical Impression Statement Patient presents today with good motivation despite not feeling well. Short rest breaks provided as needed. Patient fatigued and required some VC yet able to complete approx 20 reps with each activity. Pt will continue to benefit from skilled PT intervention to improve his overall function, weight shifting and postiion change ability, LE strength, trunk strength and improve his overall quality of life   ? Personal Factors and Comorbidities Comorbidity 3+;Time since onset of injury/illness/exacerbation   ? Comorbidities CVA, diabetes, Seizures   ? Examination-Activity Limitations Bathing;Bed Mobility;Bend;Caring for Others;Carry;Dressing;Hygiene/Grooming;Lift;Locomotion  Level;Reach Overhead;Self Feeding;Sit;Squat;Stairs;Stand;Transfers;Toileting   ? Examination-Participation Restrictions Cleaning;Community Activity;Driving;Laundry;Medication Management;Meal Prep;Occupation;Perso

## 2021-07-14 NOTE — Therapy (Signed)
[place] [facility] ?[address] ?[place], [place], [address] ?Phone: [phone]   Fax:  [phone] ? ?Occupational Therapy Treatment ? ?Patient Details  ?Name: Paul BolderSamuel Hakeem Hall ?MRN: [id number] ?Date of Birth: 1995-11-12 ?Referring Provider (OT): Lenise HeraldBenjamin Mann ? ? ?Encounter Date: 07/14/2021 ? ? OT End of Session - 07/14/21 1643   ? ? Visit Number 44   ? Number of Visits 72   ? Date for OT Re-Evaluation 09/08/21   ? Authorization Type Progress report period starting 06/28/21   ? OT Start Time 1547   ? OT Stop Time 1632   ? OT Time Calculation (min) 45 min   ? Equipment Utilized During Treatment tilt in space power wc   ? Activity Tolerance Patient tolerated treatment well   ? Behavior [facility] for tasks assessed/performed   ? ?  ?  ? ?  ? ? ?Past Medical History:  ?Diagnosis Date  ? Diabetes mellitus ([facility])   ? Hypertension   ? Stroke [facility])   ? ? ?Past Surgical History:  ?Procedure Laterality Date  ? IVC FILTER PLACEMENT ([facility] HX)    ? LEG SURGERY    ? PEG TUBE PLACEMENT    ? TRACHEOSTOMY    ? ? ?There were no vitals filed for this visit. ? ? Subjective Assessment - 07/14/21 1640   ? ? Subjective  Pt reports that family has been working on stretching R wrist into extension as wrist has been really tight.   ? Patient is accompanied by: Family member   ? Pertinent History Pt. is a 26 y.o. male who was hospitalized, and diagnosed with COVID-19, sustained a CVA with resultant quadreplegia in 01/2019. Pt. was hospitalized with VRE  UTI. Pt. PMHx includes: urinary retention, Seizure Disorder, Obstructive Sleep Disorder, DM Type II, and Morbid obesity. Pt. resides at home with his parents.   ? Limitations BUE, and LE limitations with ROM, strength, motor control, and [facility] skills.   ? Patient Stated Goals To improve his arms, and hands.   ? Currently in Pain? No/denies   ? Pain Score 5    ? Pain Location Finger (Comment which one)   ? Pain Orientation  Right;Left   ? Pain Descriptors / Indicators Tightness   ? Pain Type Chronic pain   ? Pain Radiating Towards bilat wrists and hands   ? Pain Onset More than a month ago   ? Pain Frequency Intermittent   ? Aggravating Factors  spasticity, passive stretching   ? Pain Relieving Factors rest, Estim, gentle stretching   ? Effect of Pain on Daily Activities tight hands with limited ability to manipulate ADL supplies   ? Multiple Pain Sites No   ? Pain Onset More than a month ago   ? ?  ?  ? ?  ? ?Occupational Therapy Treatment: ?Moist heat applied to R/L hands x  5min for pain reduction/muscle relaxation in prep for PROM, working to decrease tone and increase functional ROM. ? ?Estim attended: ?Pt. tolerated neuromuscular estim for cycle time 10/10,  duty cycle 50%.  Applied to the left wrist, and digit extensors for 8 min., and to R wrist and digit extensors for 5 min, intensity set to 44, and constant monitoring 100% of the time.  Manual assist was required for wrist and digit extension with each rep to hold the position.  Last half of L side treatment time, pt was cued to perform grip squeezes on cycle off time,  and to relax grip for digit extension with ramp up/cycle on time, same for last 1/2 of R side treatment time. ?  ?Therapeutic Exercise: ?Pt. tolerated slow, prolonged passive stretching for bilateral wrist flexion, extension, and MP, PIP, and DIP flexion, and extension.  Performed AAROM for shoulder flex and abduction, and elbow extension R/L x7-10 each. ? ?Response to Treatment: ?Pt is scheduled for Botox injections end of next month. Pt tolerated Estim and ROM well in the digits today with 5/10 pain initially in R hand with passive digit and wrist extension stretching, but decreased following treatment. Pt. continues to benefit from OT services  to improve ROM, and facilitate active volitional movement in order to work towards engaging his BUEs in daily ADLs and IADL tasks.  ?  ? OT Education - 07/14/21 1643    ? ? Education Details PROM bilateral hand/digits   ? Person(s) Educated Patient   ? Methods Verbal cues;Tactile cues   ? Comprehension Verbalized understanding;Verbal cues required;Need further instruction;Returned demonstration   ? ?  ?  ? ?  ? ? ? ? ? ? OT Long Term Goals - 06/23/21 1555   ? ?  ? OT LONG TERM GOAL #1  ? Title Pt. will improve FOTO score by 5 points for clinically significant improvements during ADLs, and IADLs,   ? Baseline 30th visit: FOTO: 37, 21st: FOTO 43. 10th Visit: FOTO score 42 Eval: FOTO score: 41 FOTO score: 39   ? Time 12   ? Period Weeks   ? Status On-going   ? Target Date 09/08/21   ?  ? OT LONG TERM GOAL #3  ? Title Pt. will improve right Abduction by 10 degrees to assist caregivers with underarm hygiene care.   ? Baseline Eval: AROM abduction RUE 76. Pt. continues to present with limited bilateral abduction. 21st: AROM abduction RUE 80.06/16/2021: Right shulder abduction 108. 06/23/2021: Right shoulder abduction: 110   ? Time 12   ? Period Weeks   ? Status On-going   ? Target Date 09/08/21   ?  ? OT LONG TERM GOAL #4  ? Title Pt. will demonstrate visual compensatory strategies for navigating through his environment.   ? Baseline 30th visit: Pt. continues to require assist to navigate through his environment. 20th: Pt continues to require assist to navigate environment, minimal knowledge of strategies. Eval: Limited 06/23/2021: Pt. continues to be limited, and requires assist  with navigating through his environment.   ? Time 12   ? Period Weeks   ? Status On-going   ? Target Date 09/08/21   ?  ? OT LONG TERM GOAL #5  ? Title Pt. will demonstrate visual compensatory strategies for tabletop tasks within his near space.   ? Baseline 06/16/2021: Pt. continues to present with visual limitations for tabletop tasks in his near space. Pt. continues to present with limited vision. 21st: Pt can identify objects on tabletop ~waterbottle sized, improves with contrasting colours, cannot ID small  onjects   ? Time 12   ? Period Weeks   ? Status On-going   ? Target Date 09/08/21   ?  ? OT LONG TERM GOAL #6  ? Title Pt. will perform hand to mouth patterns with supervision in preparation for self-feeding.   ? Baseline 06/23/2021: Pt. is able to use his bilateral hands to perfrom hand to mouth patterns for self-feeding with finger foods. Pt. is now able to use his left hand to bring a water bottle to his mouth,a nd  drink from it. 30th visit: Pt. continues to present with weakness limiting functional hand to mouth patterns of movement. Pt. is able to perform hand to mouth patterns, however reports weakness in his hand  limits a secure grasp on the utensil. Pt. requires assist to perform. Eval: Pt. has difficulty.   ? Time 12   ? Period Weeks   ? Target Date 09/08/21   ?  ? OT LONG TERM GOAL #7  ? Title Pt. will perform one step self-grooming tasks with minA.   ? Baseline 06/23/2021: Pt. is able to perform the hand to face pattern with the left hand, however has difficulty actively sustaining his BUEs in elevation long enough to perform self-grooming tasks. 06/16/2021: Pt. is able to perform the hand to face pattern with the left hand, however has difficulty actively sustaining his BUEs in elevation long enough to perform self-grooming tasks.   ? Time 12   ? Period Weeks   ? Status On-going   ? Target Date 09/08/21   ?  ? OT LONG TERM GOAL #8  ? Title Pt will complete UB dressing tasks with SETUP and assist to thread over hands only   ? Baseline 06/23/2021: Pt. requires MinA to thread arms, and requires maxA to donn over upper arms, MaxAx2 to donn over his back. 06/16/2021: Pt. has progressed  to require MinA to thread arms, and requires maxA to donn over upper arms, MaxAx2 to donn over his back.   ? Time 12   ? Period Weeks   ? Status On-going   ? Target Date 09/08/21   ? ?  ?  ? ?  ? ? Plan - 07/14/21 1703   ? ? Clinical Impression Statement Pt is scheduled for Botox injections end of next month. Pt tolerated Estim  and ROM well in the digits today with 5/10 pain initially in R hand with passive digit and wrist extension stretching, but decreased following treatment. Pt. continues to benefit from OT services  to improve ROM,

## 2021-07-19 ENCOUNTER — Ambulatory Visit: Payer: BC Managed Care – PPO | Admitting: Occupational Therapy

## 2021-07-19 ENCOUNTER — Ambulatory Visit: Payer: BC Managed Care – PPO

## 2021-07-19 DIAGNOSIS — R2689 Other abnormalities of gait and mobility: Secondary | ICD-10-CM | POA: Diagnosis not present

## 2021-07-19 DIAGNOSIS — R278 Other lack of coordination: Secondary | ICD-10-CM | POA: Diagnosis not present

## 2021-07-19 DIAGNOSIS — M6281 Muscle weakness (generalized): Secondary | ICD-10-CM

## 2021-07-19 DIAGNOSIS — R262 Difficulty in walking, not elsewhere classified: Secondary | ICD-10-CM | POA: Diagnosis not present

## 2021-07-19 DIAGNOSIS — I6930 Unspecified sequelae of cerebral infarction: Secondary | ICD-10-CM

## 2021-07-19 DIAGNOSIS — R269 Unspecified abnormalities of gait and mobility: Secondary | ICD-10-CM | POA: Diagnosis not present

## 2021-07-19 DIAGNOSIS — R2681 Unsteadiness on feet: Secondary | ICD-10-CM | POA: Diagnosis not present

## 2021-07-20 ENCOUNTER — Ambulatory Visit: Payer: BC Managed Care – PPO

## 2021-07-20 DIAGNOSIS — R269 Unspecified abnormalities of gait and mobility: Secondary | ICD-10-CM | POA: Diagnosis not present

## 2021-07-20 DIAGNOSIS — I6930 Unspecified sequelae of cerebral infarction: Secondary | ICD-10-CM | POA: Diagnosis not present

## 2021-07-20 DIAGNOSIS — M6281 Muscle weakness (generalized): Secondary | ICD-10-CM | POA: Diagnosis not present

## 2021-07-20 DIAGNOSIS — R262 Difficulty in walking, not elsewhere classified: Secondary | ICD-10-CM | POA: Diagnosis not present

## 2021-07-20 DIAGNOSIS — R2681 Unsteadiness on feet: Secondary | ICD-10-CM | POA: Diagnosis not present

## 2021-07-20 DIAGNOSIS — R2689 Other abnormalities of gait and mobility: Secondary | ICD-10-CM | POA: Diagnosis not present

## 2021-07-20 DIAGNOSIS — R278 Other lack of coordination: Secondary | ICD-10-CM | POA: Diagnosis not present

## 2021-07-20 NOTE — Therapy (Signed)
[place] ?[facility] ?[address] ?[place], [place], [address] ?Phone: [phone]   Fax:  [phone] ? ?Occupational Therapy Treatment ? ?Patient Details  ?Name: Paul Hall ?MRN: [id number] ?Date of Birth: 1995/08/01 ?Referring Provider (OT): Lenise HeraldBenjamin Mann ? ? ?Encounter Date: 07/19/2021 ? ? OT End of Session - 07/20/21 1907   ? ? Visit Number 45   ? Number of Visits 72   ? Date for OT Re-Evaluation 09/08/21   ? Authorization Type Progress report period starting 06/28/21   ? OT Start Time 1545   ? OT Stop Time 1630   ? OT Time Calculation (min) 45 min   ? Equipment Utilized During Treatment tilt in space power wc   ? Activity Tolerance Patient tolerated treatment well   ? Behavior [facility] for tasks assessed/performed   ? ?  ?  ? ?  ? ? ?Past Medical History:  ?Diagnosis Date  ? Diabetes mellitus (HCC)   ? Hypertension   ? Stroke [facility](HCC)   ? ? ?Past Surgical History:  ?Procedure Laterality Date  ? IVC FILTER PLACEMENT ([facility] HX)    ? LEG SURGERY    ? PEG TUBE PLACEMENT    ? TRACHEOSTOMY    ? ? ?There were no vitals filed for this visit. ? ? Subjective Assessment - 07/19/21 1901   ? ? Subjective  Pt reports doing well today.   ? Patient is accompanied by: Family member   ? Pertinent History Pt. is a 26 y.o. male who was hospitalized, and diagnosed with COVID-19, sustained a CVA with resultant quadreplegia in 01/2019. Pt. was hospitalized with VRE  UTI. Pt. PMHx includes: urinary retention, Seizure Disorder, Obstructive Sleep Disorder, DM Type II, and Morbid obesity. Pt. resides at home with his parents.   ? Limitations BUE, and LE limitations with ROM, strength, motor control, and [facility] skills.   ? Patient Stated Goals To improve his arms, and hands.   ? Currently in Pain? Yes   ? Pain Score 5    ? Pain Location Hand   ? Pain Orientation Right;Left   ? Pain Descriptors / Indicators Tightness   ? Pain Type Chronic pain   ? Pain Radiating Towards bilat  wrists and hands   ? Pain Onset More than a month ago   ? Pain Frequency Intermittent   ? Aggravating Factors  spasticity, passive stretching   ? Pain Relieving Factors rest, Estim, gentle stretching   ? Effect of Pain on Daily Activities tight hands with limited ability to manipulate ADL supplies   ? Multiple Pain Sites No   ? Pain Onset More than a month ago   ? ?  ?  ? ?  ? ?Occupational Therapy Treatment: ?Moist heat applied to R/L hands x  5min for pain reduction/muscle relaxation in prep for PROM, working to decrease tone and increase functional ROM. ?  ?Estim attended: ?Pt. tolerated neuromuscular estim for cycle time 10/10,  duty cycle 50%.  Applied to the left wrist, and digit extensors for 8 min., and to R wrist and digit extensors for 5 min, intensity set to 44, and constant monitoring 100% of the time.  Manual assist was required for wrist and digit extension with each rep to hold the position.  Pt was cued to perform grip squeezes on cycle off time, and to relax grip to facilitate digit extension with cycle on time for each hand. ?  ?Therapeutic Exercise: ?Pt. tolerated slow, prolonged  passive stretching for bilateral wrist flexion, extension, and MP, PIP, and DIP flexion, and extension.  Performed passive R/L shoulder ER and AAROM for shoulder flex, abduction, and elbow flex/extension R/L x7-10 each, working to increase functional range for self care.  ?  ?Response to Treatment: ?Pt tolerated Estim and ROM well in the digits today with 5/10 pain reported at end range stretch.  Pt continues to benefit from OT services to improve ROM, and facilitate active volitional movement in order to work towards engaging his BUEs into ADLs. ? ? ? OT Education - 07/19/21 1905   ? ? Education Details PROM bilateral hand/digits   ? Person(s) Educated Patient   ? Methods Verbal cues;Tactile cues   ? Comprehension Verbalized understanding;Verbal cues required;Need further instruction;Returned demonstration   ? ?  ?  ? ?   ? ? ? ? ? ? OT Long Term Goals - 06/23/21 1555   ? ?  ? OT LONG TERM GOAL #1  ? Title Pt. will improve FOTO score by 5 points for clinically significant improvements during ADLs, and IADLs,   ? Baseline 30th visit: FOTO: 37, 21st: FOTO 43. 10th Visit: FOTO score 42 Eval: FOTO score: 41 FOTO score: 39   ? Time 12   ? Period Weeks   ? Status On-going   ? Target Date 09/08/21   ?  ? OT LONG TERM GOAL #3  ? Title Pt. will improve right Abduction by 10 degrees to assist caregivers with underarm hygiene care.   ? Baseline Eval: AROM abduction RUE 76. Pt. continues to present with limited bilateral abduction. 21st: AROM abduction RUE 80.06/16/2021: Right shulder abduction 108. 06/23/2021: Right shoulder abduction: 110   ? Time 12   ? Period Weeks   ? Status On-going   ? Target Date 09/08/21   ?  ? OT LONG TERM GOAL #4  ? Title Pt. will demonstrate visual compensatory strategies for navigating through his environment.   ? Baseline 30th visit: Pt. continues to require assist to navigate through his environment. 20th: Pt continues to require assist to navigate environment, minimal knowledge of strategies. Eval: Limited 06/23/2021: Pt. continues to be limited, and requires assist  with navigating through his environment.   ? Time 12   ? Period Weeks   ? Status On-going   ? Target Date 09/08/21   ?  ? OT LONG TERM GOAL #5  ? Title Pt. will demonstrate visual compensatory strategies for tabletop tasks within his near space.   ? Baseline 06/16/2021: Pt. continues to present with visual limitations for tabletop tasks in his near space. Pt. continues to present with limited vision. 21st: Pt can identify objects on tabletop ~waterbottle sized, improves with contrasting colours, cannot ID small onjects   ? Time 12   ? Period Weeks   ? Status On-going   ? Target Date 09/08/21   ?  ? OT LONG TERM GOAL #6  ? Title Pt. will perform hand to mouth patterns with supervision in preparation for self-feeding.   ? Baseline 06/23/2021: Pt. is able  to use his bilateral hands to perfrom hand to mouth patterns for self-feeding with finger foods. Pt. is now able to use his left hand to bring a water bottle to his mouth,a nd drink from it. 30th visit: Pt. continues to present with weakness limiting functional hand to mouth patterns of movement. Pt. is able to perform hand to mouth patterns, however reports weakness in his hand  limits a secure grasp on  the utensil. Pt. requires assist to perform. Eval: Pt. has difficulty.   ? Time 12   ? Period Weeks   ? Target Date 09/08/21   ?  ? OT LONG TERM GOAL #7  ? Title Pt. will perform one step self-grooming tasks with minA.   ? Baseline 06/23/2021: Pt. is able to perform the hand to face pattern with the left hand, however has difficulty actively sustaining his BUEs in elevation long enough to perform self-grooming tasks. 06/16/2021: Pt. is able to perform the hand to face pattern with the left hand, however has difficulty actively sustaining his BUEs in elevation long enough to perform self-grooming tasks.   ? Time 12   ? Period Weeks   ? Status On-going   ? Target Date 09/08/21   ?  ? OT LONG TERM GOAL #8  ? Title Pt will complete UB dressing tasks with SETUP and assist to thread over hands only   ? Baseline 06/23/2021: Pt. requires MinA to thread arms, and requires maxA to donn over upper arms, MaxAx2 to donn over his back. 06/16/2021: Pt. has progressed  to require MinA to thread arms, and requires maxA to donn over upper arms, MaxAx2 to donn over his back.   ? Time 12   ? Period Weeks   ? Status On-going   ? Target Date 09/08/21   ? ?  ?  ? ?  ? ? ? ? ? ? ? ? Plan - 07/19/21 1917   ? ? Clinical Impression Statement Pt tolerated Estim and ROM well in the digits today with 5/10 pain reported at end range stretch.  Pt continues to benefit from OT services to improve ROM, and facilitate active volitional movement in order to work towards engaging his BUEs into ADLs.   ? OT Occupational Profile and History Detailed  Assessment- Review of Records and additional review of physical, cognitive, psychosocial history related to current functional performance   ? Occupational performance deficits (Please refer to evaluation for det

## 2021-07-20 NOTE — Therapy (Signed)
[place] ?[facility] ?[place], [place], [address] ?Phone: [phone]   Fax:  [phone] ? ?Physical Therapy Treatment ? ?Patient Details  ?Name: Paul Hall ?MRN: [id number] ?Date of Birth: August 01, 1995 ?Referring Provider (PT): Collene Mares, PA-C ? ? ?Encounter Date: 07/20/2021 ? ? PT End of Session - 07/20/21 1425   ? ? Visit Number 36   ? Number of Visits 37   ? Date for PT Re-Evaluation 09/13/21   ? Authorization Type [employer] and [employer]- Need auth past 12th visit   ? Authorization Time Period 01/04/21-03/29/21; Recert [id number]   ? Progress Note Due on Visit 40   ? PT Start Time 1430   ? PT Stop Time 8841   ? PT Time Calculation (min) 44 min   ? Equipment Utilized During Treatment --   [employer] lift  ? Activity Tolerance Patient tolerated treatment well   queasiness/nausea  ? Behavior During Therapy [facility] for tasks assessed/performed   ? ?  ?  ? ?  ? ? ?Past Medical History:  ?Diagnosis Date  ? Diabetes mellitus ([place])   ? Hypertension   ? Stroke [facility])   ? ? ?Past Surgical History:  ?Procedure Laterality Date  ? IVC FILTER PLACEMENT ([facility] HX)    ? LEG SURGERY    ? PEG TUBE PLACEMENT    ? TRACHEOSTOMY    ? ? ?There were no vitals filed for this visit. ? ? Subjective Assessment - 07/20/21 1654   ? ? Subjective Patient reports his back is doing better. Eager to participate with therapy.   ? Patient is accompained by: Family member   ? Pertinent History [name] is a 83yoM who presents with severe weakness, quadriparesis, altered sensorium, and visual impairment s/p critical illness and prolonged hospitalization. Pt hospitalized in October 2020 with ARDS 2/2 COVID19 infection. Pt sustained a complex and lengthy hospitalization which included tracheostomy, prolonged sedation, ECMO. In this period pt sustained CVA and SDH. Pt has now been liberated from tracheostomy and G-tube. Pt has since been hospitalized for wound  infection and UTI. Pt lives with parents at home, has hospital bed and left chair, hoyer lift transfers, and power WC for mobility needs. Pt needs heavy physical assistance with ADL 2/2 BUE contractures and motor dysfunction.   ? Limitations Lifting;Standing;Walking;Writing;Reading;Sitting;House hold activities   ? How long can you sit comfortably? Patient reports uable to sit up long < 4 hours due to buttock discomfort   ? How long can you stand comfortably? Patient is currently unable to stand   ? How long can you walk comfortably? Patient is currently unable to walk   ? Diagnostic tests No recent relavent diagnostic tests   ? Patient Stated Goals I want to be able to possibly walk again   ? Currently in Pain? No/denies   ? ?  ?  ? ?  ? ? ? ? ? ? ? ? ? ? ?INTERVENTIONS:  ?Hoyer transfer from power w/c to mat table using 2 person assist- No issued today using green strap loops for all 4 loops.  ?  ?Supine with use of wedge and 2 pillows behind patient - Mod assist into sitting at edge of mat. Then once ready to transfer back - mod assist from sit to modified supine with legs hanging off side of mat.  ? ?Sitting EOB:  x26 minutes ?-Seated boxing: cues for sequencing, body mechanics, and pace/rhythm for functional contraction and timing of muscle recruitment: ?-  Cross body punches to mitts on PT hands with no back support for focused core stabilization with crossing midline 2x 10x each UE  ?-Cross body punch with secondary combination of elbow for full cross body rotation to PT mitt for trunk stability, coordination, and cardiovascular challenge x 10 each arm ?-Upper cut to mitts in PT hands for core activation without back support with perturbations 10x each side ; modifications for RUE  ?-cross body punch combined with duck x10 each UE x 2 sets ?-seated LAQ into mitts 10x each LE ?-lateral weight shift to "dodge" 10x each side  ? ?In chair:  ? ?Seated manual resist hip add/abd x 20 reps each.  ?Seated resisted  hamstring curl 10x each LE  ?Hamstring lengthening stretch 60 seconds each LE ?Adductor lengthening stretch 60 seconds each LE ?  ?Education provided throughout session via VC/TC and demonstration to facilitate movement at target joints and correct muscle activation for all testing and exercises performed.  ?  ?Patient tolerates upright posture without assistance for longest duration to date this session. Utilization of mitts for boxing components allow for core stabilization training with pertubation's. Patient is very motivated throughout session and eager to progress. Pt will continue to benefit from skilled PT intervention to improve his overall function, weight shifting and postiion change ability, LE strength, trunk strength and improve his overall quality of life ? ? ? ? ? ? ? ? ? ? ? ? ? ? ? ? ? PT Education - 07/20/21 1425   ? ? Education Details exercise technique, body mechanics   ? Person(s) Educated Patient   ? Methods Explanation;Demonstration;Tactile cues;Verbal cues   ? Comprehension Returned demonstration;Verbal cues required;Verbalized understanding;Tactile cues required   ? ?  ?  ? ?  ? ? ? PT Short Term Goals - 05/10/21 1413   ? ?  ? PT SHORT TERM GOAL #1  ? Title Pt will be independent with HEP in order to improve strength and balance in order to decrease fall risk and improve function at home and work.   ? Baseline 01/04/2021= No formal HEP in place; 12/12 no HEP in place; 05/10/2021-Patient and his father were able to report compliance with curent HEP consisting of mostly seated/reclined LE strengthening. Both verbalize no questions at this time.   ? Time 6   ? Period Weeks   ? Status Achieved   ? Target Date 02/15/21   ? ?  ?  ? ?  ? ? ? ? PT Long Term Goals - 06/21/21 0848   ? ?  ? PT LONG TERM GOAL #1  ? Title Patient will increase BLE gross strength by 1/2 muscle grade to improve functional strength for improved independence with potential gait, increased standing tolerance and increased ADL  ability.   ? Baseline 01/04/2021- Patient presents with 1/5 to 3-/5 B LE strength with MMT; 12/12: goal partially met for Left knee/hip; 05/10/2021= 2-/5 bilateal Hip flex; 3+/5 bilateral Knee ext; 06/21/2021= Patient presents with 2-/5 bilateral Hip flex; 3+/5 bilateral knee ext/flex; 2-/5 left ankle DF; 0/5 right ankle- and able to increase reps and resistance with LE's   ? Time 12   ? Period Weeks   ? Status Partially Met   ? Target Date 03/29/21   ?  ? PT LONG TERM GOAL #2  ? Title Patient will tolerate sitting unsupported demonstrating erect sitting posture for 15 minutes with CGA to demonstrate improved back extensor strength and improved sitting tolerance.   ? Baseline 01/04/2021-  Patient confied to sitting in lift chair or electric power chair with back support and unable to sit upright without physical assistance; 12/12: tolerates <1 minutes upright unsupported sitting. 05/10/2021=static sit with forward trunk lean  in his power wheelchair without back support x approx 3 min. 06/21/2021=Unable to assess today due to patient with acute back pain but on previous visit able to sit x 8 min without back support.   ? Time 12   ? Period Weeks   ? Status On-going   ? Target Date 09/13/21   ?  ? PT LONG TERM GOAL #3  ? Title Patient will demonstrate ability to perform static standing in // bars > 2 min with Max Assist  without loss of balance and fair posture for improved overall strength for pre-gait and transfer activities.   ? Baseline 01/04/2021= Patient current uanble to stand- Dependent on hoyer or sit to stand lift for transfers. 05/10/2021=Not appropriate yet- Currently still dependent with all transfers using hoyer. 06/21/2021= Patient continuing now to focus on LE strengthening to prepare for standing-unable to try today due to acute low back pain-  planning on attempting in new cert period.   ? Time 12   ? Period Weeks   ? Status On-going   ? Target Date 09/13/21   ?  ? PT LONG TERM GOAL #4  ? Title Pt will improve  FOTO score by 10 points or more demonstrating improved perceived functional ability   ? Baseline FOTO 7 on 10/17; 03/15/21: FOTO 12; 05/10/2021 06/21/2021= 1   ? Time 12   ? Period Weeks   ? Status On-going   ? Target

## 2021-07-21 ENCOUNTER — Ambulatory Visit: Payer: BC Managed Care – PPO | Admitting: Occupational Therapy

## 2021-07-21 ENCOUNTER — Ambulatory Visit: Payer: BC Managed Care – PPO

## 2021-07-22 ENCOUNTER — Ambulatory Visit: Payer: BC Managed Care – PPO

## 2021-07-25 DIAGNOSIS — G4733 Obstructive sleep apnea (adult) (pediatric): Secondary | ICD-10-CM | POA: Diagnosis not present

## 2021-07-26 ENCOUNTER — Ambulatory Visit: Payer: BC Managed Care – PPO

## 2021-07-26 ENCOUNTER — Ambulatory Visit: Payer: BC Managed Care – PPO | Admitting: Physical Therapy

## 2021-07-26 DIAGNOSIS — R2689 Other abnormalities of gait and mobility: Secondary | ICD-10-CM | POA: Diagnosis not present

## 2021-07-26 DIAGNOSIS — M6281 Muscle weakness (generalized): Secondary | ICD-10-CM

## 2021-07-26 DIAGNOSIS — R278 Other lack of coordination: Secondary | ICD-10-CM

## 2021-07-26 DIAGNOSIS — R262 Difficulty in walking, not elsewhere classified: Secondary | ICD-10-CM | POA: Diagnosis not present

## 2021-07-26 DIAGNOSIS — R2681 Unsteadiness on feet: Secondary | ICD-10-CM

## 2021-07-26 DIAGNOSIS — R269 Unspecified abnormalities of gait and mobility: Secondary | ICD-10-CM | POA: Diagnosis not present

## 2021-07-26 DIAGNOSIS — I6930 Unspecified sequelae of cerebral infarction: Secondary | ICD-10-CM | POA: Diagnosis not present

## 2021-07-26 NOTE — Therapy (Signed)
[place] [facility] ?[place], [place], [address] ?Phone: [phone]   Fax:  [phone] ? ?Physical Therapy Treatment ? ?Patient Details  ?Name: Paul Hall ?MRN: [id number] ?Date of Birth: [birth year] ?Referring Provider (PT): Collene Mares, PA-C ? ? ?Encounter Date: 07/26/2021 ? ? PT End of Session - 07/26/21 1318   ? ? Visit Number 59   ? Number of Visits 37   ? Date for PT Re-Evaluation 09/13/21   ? Authorization Type [employer] and [employer]- Need auth past 12th visit   ? Authorization Time Period 01/04/21-03/29/21; Recert [id number]/31/5745   ? Progress Note Due on Visit 40   ? PT Start Time 1345   ? PT Stop Time 3403   ? PT Time Calculation (min) 44 min   ? Equipment Utilized During Treatment --   [employer] lift  ? Activity Tolerance Patient tolerated treatment well   queasiness/nausea  ? Behavior During Therapy [facility] for tasks assessed/performed   ? ?  ?  ? ?  ? ? ?Past Medical History:  ?Diagnosis Date  ? Diabetes mellitus ([place])   ? Hypertension   ? Stroke [facility])   ? ? ?Past Surgical History:  ?Procedure Laterality Date  ? IVC FILTER PLACEMENT ([facility] HX)    ? LEG SURGERY    ? PEG TUBE PLACEMENT    ? TRACHEOSTOMY    ? ? ?There were no vitals filed for this visit. ? ? Subjective Assessment - 07/26/21 1538   ? ? Subjective Patient reports he was sore after last session but has been practicing.   ? Patient is accompained by: Family member   ? Pertinent History Paul Hall is a 70yoM who presents with severe weakness, quadriparesis, altered sensorium, and visual impairment s/p critical illness and prolonged hospitalization. Pt hospitalized in October 2020 with ARDS 2/2 COVID19 infection. Pt sustained a complex and lengthy hospitalization which included tracheostomy, prolonged sedation, ECMO. In this period pt sustained CVA and SDH. Pt has now been liberated from tracheostomy and G-tube. Pt has since been hospitalized for wound infection and  UTI. Pt lives with parents at home, has hospital bed and left chair, hoyer lift transfers, and power WC for mobility needs. Pt needs heavy physical assistance with ADL 2/2 BUE contractures and motor dysfunction.   ? Limitations Lifting;Standing;Walking;Writing;Reading;Sitting;House hold activities   ? How long can you sit comfortably? Patient reports uable to sit up long < 4 hours due to buttock discomfort   ? How long can you stand comfortably? Patient is currently unable to stand   ? How long can you walk comfortably? Patient is currently unable to walk   ? Diagnostic tests No recent relavent diagnostic tests   ? Patient Stated Goals I want to be able to possibly walk again   ? Currently in Pain? Yes   ? Pain Score 2    ? Pain Location Back   ? Pain Orientation Lower   ? Pain Descriptors / Indicators Tightness   ? Pain Onset In the past 7 days   ? Pain Frequency Intermittent   ? ?  ?  ? ?  ? ? ? ? ? ? ?INTERVENTIONS:  ?Hoyer transfer from power w/c to mat table using 2 person assist- No issued today using green strap loops for all 4 loops.  ?  ?Supine with use of wedge and 2 pillows behind patient - Mod assist into sitting at edge of mat. Then once ready to transfer back -  mod assist from sit to modified supine with legs hanging off side of mat.  ?  ?Sitting EOB:  x26 minutes ?-Seated boxing: cues for sequencing, body mechanics, and pace/rhythm for functional contraction and timing of muscle recruitment: ?-Cross body punches to mitts on PT hands with no back support for focused core stabilization with crossing midline 2x 10x each UE  ?-Cross body punch with secondary combination of elbow for full cross body rotation to PT mitt for trunk stability, coordination, and cardiovascular challenge x 10 each arm ?-Upper cut to mitts in PT hands for core activation without back support with perturbations 10x each side ; modifications for RUE  ?-cross body punch combined with duck x10 each UE x 2 sets ?-lateral weight shift  to "dodge" 10x each side  ?-elbow extensions into pad x10 each side  ?  ?In chair:  ?  ?Seated manual resist hip add/abd x 20 reps each.  ?Seated resisted hamstring curl 10x each LE  ?Hamstring lengthening stretch 60 seconds each LE ?Adductor lengthening stretch 60 seconds each LE ?  ?Education provided throughout session via VC/TC and demonstration to facilitate movement at target joints and correct muscle activation for all testing and exercises performed.  ?  ? ?Patient is able to tolerate progressive stabilization interventions in seated position without LOB. He demonstrates improved power of movements this session indicating carryover between sessions and compliance at home. Pt will continue to benefit from skilled PT intervention to improve his overall function, weight shifting and postiion change ability, LE strength, trunk strength and improve his overall quality of life ? ? ? ? ? ? ? ? ? ? ? ? ? ? ? ? ? ? ? ? ? ? ? PT Education - 07/26/21 1318   ? ? Education Details exercise technique, body mechanics   ? Person(s) Educated Patient   ? Methods Explanation;Demonstration;Verbal cues;Tactile cues   ? Comprehension Verbalized understanding;Returned demonstration;Verbal cues required;Tactile cues required   ? ?  ?  ? ?  ? ? ? PT Short Term Goals - 05/10/21 1413   ? ?  ? PT SHORT TERM GOAL #1  ? Title Pt will be independent with HEP in order to improve strength and balance in order to decrease fall risk and improve function at home and work.   ? Baseline 01/04/2021= No formal HEP in place; 12/12 no HEP in place; 05/10/2021-Patient and his father were able to report compliance with curent HEP consisting of mostly seated/reclined LE strengthening. Both verbalize no questions at this time.   ? Time 6   ? Period Weeks   ? Status Achieved   ? Target Date 02/15/21   ? ?  ?  ? ?  ? ? ? ? PT Long Term Goals - 06/21/21 0848   ? ?  ? PT LONG TERM GOAL #1  ? Title Patient will increase BLE gross strength by 1/2 muscle grade to  improve functional strength for improved independence with potential gait, increased standing tolerance and increased ADL ability.   ? Baseline 01/04/2021- Patient presents with 1/5 to 3-/5 B LE strength with MMT; 12/12: goal partially met for Left knee/hip; 05/10/2021= 2-/5 bilateal Hip flex; 3+/5 bilateral Knee ext; 06/21/2021= Patient presents with 2-/5 bilateral Hip flex; 3+/5 bilateral knee ext/flex; 2-/5 left ankle DF; 0/5 right ankle- and able to increase reps and resistance with LE's   ? Time 12   ? Period Weeks   ? Status Partially Met   ? Target Date 03/29/21   ?  ?  PT LONG TERM GOAL #2  ? Title Patient will tolerate sitting unsupported demonstrating erect sitting posture for 15 minutes with CGA to demonstrate improved back extensor strength and improved sitting tolerance.   ? Baseline 01/04/2021- Patient confied to sitting in lift chair or electric power chair with back support and unable to sit upright without physical assistance; 12/12: tolerates <1 minutes upright unsupported sitting. 05/10/2021=static sit with forward trunk lean  in his power wheelchair without back support x approx 3 min. 06/21/2021=Unable to assess today due to patient with acute back pain but on previous visit able to sit x 8 min without back support.   ? Time 12   ? Period Weeks   ? Status On-going   ? Target Date 09/13/21   ?  ? PT LONG TERM GOAL #3  ? Title Patient will demonstrate ability to perform static standing in // bars > 2 min with Max Assist  without loss of balance and fair posture for improved overall strength for pre-gait and transfer activities.   ? Baseline 01/04/2021= Patient current uanble to stand- Dependent on hoyer or sit to stand lift for transfers. 05/10/2021=Not appropriate yet- Currently still dependent with all transfers using hoyer. 06/21/2021= Patient continuing now to focus on LE strengthening to prepare for standing-unable to try today due to acute low back pain-  planning on attempting in new cert period.   ?  Time 12   ? Period Weeks   ? Status On-going   ? Target Date 09/13/21   ?  ? PT LONG TERM GOAL #4  ? Title Pt will improve FOTO score by 10 points or more demonstrating improved perceived functional a

## 2021-07-27 NOTE — Therapy (Signed)
[place] [facility] ?[address] ?[place], [place], [address] ?Phone: [phone]   Fax:  [phone] ? ?Occupational Therapy Treatment ? ?Patient Details  ?Name: Paul BolderSamuel Hakeem Hall ?MRN: [id number] ?Date of Birth: [birth year] ?Referring Provider (OT): Lenise HeraldBenjamin Mann ? ? ?Encounter Date: 07/26/2021 ? ? OT End of Session - 07/27/21 1938   ? ? Visit Number 46   ? Number of Visits 72   ? Date for OT Re-Evaluation 09/08/21   ? Authorization Type Progress report period starting 06/28/21   ? OT Start Time 1545   ? OT Stop Time 1630   ? OT Time Calculation (min) 45 min   ? Equipment Utilized During Treatment tilt in space power wc   ? Activity Tolerance Patient tolerated treatment well   ? Behavior [facility] for tasks assessed/performed   ? ?  ?  ? ?  ? ? ?Past Medical History:  ?Diagnosis Date  ? Diabetes mellitus ([facility])   ? Hypertension   ? Stroke [facility])   ? ? ?Past Surgical History:  ?Procedure Laterality Date  ? IVC FILTER PLACEMENT ([facility] HX)    ? LEG SURGERY    ? PEG TUBE PLACEMENT    ? TRACHEOSTOMY    ? ? ?There were no vitals filed for this visit. ? ? Subjective Assessment - 07/26/21 1934   ? ? Subjective  Pt reports having a good weekend watching movies with his brother.   ? Patient is accompanied by: Family member   ? Pertinent History Pt. is a 26 y.o. male who was hospitalized, and diagnosed with COVID-19, sustained a CVA with resultant quadreplegia in 01/2019. Pt. was hospitalized with VRE  UTI. Pt. PMHx includes: urinary retention, Seizure Disorder, Obstructive Sleep Disorder, DM Type II, and Morbid obesity. Pt. resides at home with his parents.   ? Limitations BUE, and LE limitations with ROM, strength, motor control, and [facility] skills.   ? Patient Stated Goals To improve his arms, and hands.   ? Currently in Pain? Yes   ? Pain Score 2    ? Pain Location Back   ? Pain Orientation Lower   ? Pain Descriptors / Indicators Tightness   ? Pain Type Chronic pain    ? Pain Radiating Towards low back   ? Pain Onset More than a month ago   ? Pain Frequency Intermittent   ? Aggravating Factors  prolonged positioning   ? Pain Relieving Factors repositioning   ? Effect of Pain on Daily Activities decreased tolerance for prolonged positioning   ? Multiple Pain Sites No   ? Pain Score 3   ? Pain Location Hand   ? Pain Orientation Right;Left   ? Pain Descriptors / Indicators Tightness   ? Pain Type Chronic pain   ? Pain Onset More than a month ago   ? Pain Frequency Intermittent   ? Aggravating Factors  end range stretch/hypertonicity   ? Pain Relieving Factors rest, heat   ? Effect of Pain on Daily Activities limited ability to grasp/release ADL supplies d/t high tone in hands   ? ?  ?  ? ?  ? ?Occupational Therapy Treatment: ?Moist heat applied to R/L hands x  5min for pain reduction/muscle relaxation in prep for PROM, working to decrease tone and increase functional ROM. ?  ?Therapeutic Exercise: ?Pt. tolerated slow, prolonged passive stretching for bilateral wrist flexion, extension, and MP, PIP, and DIP flexion, and extension.  Performed passive R/L shoulder ER,  shoulder flex, abduction, and elbow flex/extension, working to increase functional range for self care.  Completed AAROM with use of 1# dowel, L hand secured to dowel with ACE wrap, OT providing hand over hand assist to secure R hand grip and assist RUE through ROM arc for chest press, shoulder flex, and horiz abd/add; performed 2 sets 10 reps each with tilt in space wc reclined to nearly supine position.   ? ?Response to Treatment: ?See Plan/clinical impression below. ? ? OT Education - 07/26/21 1938   ? ? Education Details AAROM for shoulders using 1# dowel   ? Person(s) Educated Patient   ? Methods Verbal cues;Tactile cues;Explanation;Demonstration   ? Comprehension Verbalized understanding;Verbal cues required;Need further instruction;Returned demonstration;Tactile cues required   ? ?  ?  ? ?  ? ? ? OT Long Term Goals  - 06/23/21 1555   ? ?  ? OT LONG TERM GOAL #1  ? Title Pt. will improve FOTO score by 5 points for clinically significant improvements during ADLs, and IADLs,   ? Baseline 30th visit: FOTO: 37, 21st: FOTO 43. 10th Visit: FOTO score 42 Eval: FOTO score: 41 FOTO score: 39   ? Time 12   ? Period Weeks   ? Status On-going   ? Target Date 09/08/21   ?  ? OT LONG TERM GOAL #3  ? Title Pt. will improve right Abduction by 10 degrees to assist caregivers with underarm hygiene care.   ? Baseline Eval: AROM abduction RUE 76. Pt. continues to present with limited bilateral abduction. 21st: AROM abduction RUE 80.06/16/2021: Right shulder abduction 108. 06/23/2021: Right shoulder abduction: 110   ? Time 12   ? Period Weeks   ? Status On-going   ? Target Date 09/08/21   ?  ? OT LONG TERM GOAL #4  ? Title Pt. will demonstrate visual compensatory strategies for navigating through his environment.   ? Baseline 30th visit: Pt. continues to require assist to navigate through his environment. 20th: Pt continues to require assist to navigate environment, minimal knowledge of strategies. Eval: Limited 06/23/2021: Pt. continues to be limited, and requires assist  with navigating through his environment.   ? Time 12   ? Period Weeks   ? Status On-going   ? Target Date 09/08/21   ?  ? OT LONG TERM GOAL #5  ? Title Pt. will demonstrate visual compensatory strategies for tabletop tasks within his near space.   ? Baseline 06/16/2021: Pt. continues to present with visual limitations for tabletop tasks in his near space. Pt. continues to present with limited vision. 21st: Pt can identify objects on tabletop ~waterbottle sized, improves with contrasting colours, cannot ID small onjects   ? Time 12   ? Period Weeks   ? Status On-going   ? Target Date 09/08/21   ?  ? OT LONG TERM GOAL #6  ? Title Pt. will perform hand to mouth patterns with supervision in preparation for self-feeding.   ? Baseline 06/23/2021: Pt. is able to use his bilateral hands to  perfrom hand to mouth patterns for self-feeding with finger foods. Pt. is now able to use his left hand to bring a water bottle to his mouth,a nd drink from it. 30th visit: Pt. continues to present with weakness limiting functional hand to mouth patterns of movement. Pt. is able to perform hand to mouth patterns, however reports weakness in his hand  limits a secure grasp on the utensil. Pt. requires assist to perform. Eval: Pt.  has difficulty.   ? Time 12   ? Period Weeks   ? Target Date 09/08/21   ?  ? OT LONG TERM GOAL #7  ? Title Pt. will perform one step self-grooming tasks with minA.   ? Baseline 06/23/2021: Pt. is able to perform the hand to face pattern with the left hand, however has difficulty actively sustaining his BUEs in elevation long enough to perform self-grooming tasks. 06/16/2021: Pt. is able to perform the hand to face pattern with the left hand, however has difficulty actively sustaining his BUEs in elevation long enough to perform self-grooming tasks.   ? Time 12   ? Period Weeks   ? Status On-going   ? Target Date 09/08/21   ?  ? OT LONG TERM GOAL #8  ? Title Pt will complete UB dressing tasks with SETUP and assist to thread over hands only   ? Baseline 06/23/2021: Pt. requires MinA to thread arms, and requires maxA to donn over upper arms, MaxAx2 to donn over his back. 06/16/2021: Pt. has progressed  to require MinA to thread arms, and requires maxA to donn over upper arms, MaxAx2 to donn over his back.   ? Time 12   ? Period Weeks   ? Status On-going   ? Target Date 09/08/21   ? ?  ?  ? ?  ? ? ? ? Plan - 07/26/21 1946   ? ? Clinical Impression Statement Moist heat, PROM, and bilat dowel exercises tolerated well and effective in reducing distal flexor tone.  Pt continues to benefit from OT services to improve ROM, and facilitate active volitional movement in order to work towards engaging his BUEs into ADLs.   ? OT Occupational Profile and History Detailed Assessment- Review of Records and  additional review of physical, cognitive, psychosocial history related to current functional performance   ? Occupational performance deficits (Please refer to evaluation for details): ADL's;IADL's   ? Body St

## 2021-07-28 ENCOUNTER — Ambulatory Visit: Payer: BC Managed Care – PPO

## 2021-07-28 ENCOUNTER — Ambulatory Visit: Payer: BC Managed Care – PPO | Admitting: Occupational Therapy

## 2021-07-28 DIAGNOSIS — I10 Essential (primary) hypertension: Secondary | ICD-10-CM | POA: Diagnosis not present

## 2021-07-28 DIAGNOSIS — E1165 Type 2 diabetes mellitus with hyperglycemia: Secondary | ICD-10-CM | POA: Diagnosis not present

## 2021-07-28 DIAGNOSIS — G4733 Obstructive sleep apnea (adult) (pediatric): Secondary | ICD-10-CM | POA: Diagnosis not present

## 2021-07-28 DIAGNOSIS — Z0001 Encounter for general adult medical examination with abnormal findings: Secondary | ICD-10-CM | POA: Diagnosis not present

## 2021-07-28 DIAGNOSIS — I6389 Other cerebral infarction: Secondary | ICD-10-CM | POA: Diagnosis not present

## 2021-07-28 DIAGNOSIS — L8991 Pressure ulcer of unspecified site, stage 1: Secondary | ICD-10-CM | POA: Diagnosis not present

## 2021-07-28 DIAGNOSIS — B356 Tinea cruris: Secondary | ICD-10-CM | POA: Diagnosis not present

## 2021-07-28 DIAGNOSIS — Z1331 Encounter for screening for depression: Secondary | ICD-10-CM | POA: Diagnosis not present

## 2021-07-28 DIAGNOSIS — Z993 Dependence on wheelchair: Secondary | ICD-10-CM | POA: Diagnosis not present

## 2021-07-29 ENCOUNTER — Ambulatory Visit: Payer: BC Managed Care – PPO

## 2021-07-29 ENCOUNTER — Ambulatory Visit: Payer: BC Managed Care – PPO | Admitting: Occupational Therapy

## 2021-07-29 DIAGNOSIS — L8994 Pressure ulcer of unspecified site, stage 4: Secondary | ICD-10-CM | POA: Diagnosis not present

## 2021-07-29 DIAGNOSIS — G4733 Obstructive sleep apnea (adult) (pediatric): Secondary | ICD-10-CM | POA: Diagnosis not present

## 2021-07-29 DIAGNOSIS — I6389 Other cerebral infarction: Secondary | ICD-10-CM | POA: Diagnosis not present

## 2021-08-02 ENCOUNTER — Ambulatory Visit: Payer: BC Managed Care – PPO

## 2021-08-02 ENCOUNTER — Ambulatory Visit: Payer: BC Managed Care – PPO | Admitting: Occupational Therapy

## 2021-08-02 ENCOUNTER — Ambulatory Visit: Payer: BC Managed Care – PPO | Attending: Physician Assistant | Admitting: Occupational Therapy

## 2021-08-02 DIAGNOSIS — M6281 Muscle weakness (generalized): Secondary | ICD-10-CM | POA: Insufficient documentation

## 2021-08-02 DIAGNOSIS — R269 Unspecified abnormalities of gait and mobility: Secondary | ICD-10-CM | POA: Diagnosis not present

## 2021-08-02 DIAGNOSIS — R278 Other lack of coordination: Secondary | ICD-10-CM | POA: Insufficient documentation

## 2021-08-02 DIAGNOSIS — R2689 Other abnormalities of gait and mobility: Secondary | ICD-10-CM | POA: Insufficient documentation

## 2021-08-02 DIAGNOSIS — R262 Difficulty in walking, not elsewhere classified: Secondary | ICD-10-CM | POA: Insufficient documentation

## 2021-08-02 DIAGNOSIS — R2681 Unsteadiness on feet: Secondary | ICD-10-CM | POA: Diagnosis not present

## 2021-08-02 DIAGNOSIS — I6930 Unspecified sequelae of cerebral infarction: Secondary | ICD-10-CM | POA: Diagnosis not present

## 2021-08-03 ENCOUNTER — Ambulatory Visit: Payer: BC Managed Care – PPO | Admitting: Occupational Therapy

## 2021-08-03 ENCOUNTER — Ambulatory Visit: Payer: BC Managed Care – PPO

## 2021-08-03 ENCOUNTER — Encounter: Payer: Self-pay | Admitting: Occupational Therapy

## 2021-08-03 DIAGNOSIS — M6281 Muscle weakness (generalized): Secondary | ICD-10-CM

## 2021-08-03 DIAGNOSIS — R262 Difficulty in walking, not elsewhere classified: Secondary | ICD-10-CM

## 2021-08-03 DIAGNOSIS — R2689 Other abnormalities of gait and mobility: Secondary | ICD-10-CM | POA: Diagnosis not present

## 2021-08-03 DIAGNOSIS — I6930 Unspecified sequelae of cerebral infarction: Secondary | ICD-10-CM | POA: Diagnosis not present

## 2021-08-03 DIAGNOSIS — R2681 Unsteadiness on feet: Secondary | ICD-10-CM

## 2021-08-03 DIAGNOSIS — R269 Unspecified abnormalities of gait and mobility: Secondary | ICD-10-CM | POA: Diagnosis not present

## 2021-08-03 DIAGNOSIS — R278 Other lack of coordination: Secondary | ICD-10-CM | POA: Diagnosis not present

## 2021-08-03 NOTE — Therapy (Signed)
[facility] ?[place], [place], [address] ?Phone: [phone]   Fax:  [phone] ? ?Physical Therapy Treatment ? ?Patient Details  ?Name: Paul Hall ?MRN: [id number] ?Date of Birth: September 06, 1995 ?Referring Provider (PT): Collene Mares, PA-C ? ? ?Encounter Date: 08/03/2021 ? ? PT End of Session - 08/03/21 1553   ? ? Visit Number 38   ? Number of Visits 37   ? Date for PT Re-Evaluation 09/13/21   ? Authorization Type [employer] and [employer]- Need auth past 12th visit   ? Authorization Time Period 01/04/21-03/29/21; Recert [id number]/[id number]/0802   ? Progress Note Due on Visit 40   ? PT Start Time 1600   ? PT Stop Time 2336   ? PT Time Calculation (min) 44 min   ? Equipment Utilized During Treatment --   [employer] lift  ? Activity Tolerance Patient tolerated treatment well   queasiness/nausea  ? Behavior During Therapy Paul Hall for tasks assessed/performed   ? ?  ?  ? ?  ? ? ?Past Medical History:  ?Diagnosis Date  ? Diabetes mellitus ([place])   ? Hypertension   ? Stroke [facility])   ? ? ?Past Surgical History:  ?Procedure Laterality Date  ? IVC FILTER PLACEMENT ([facility] HX)    ? LEG SURGERY    ? PEG TUBE PLACEMENT    ? TRACHEOSTOMY    ? ? ?There were no vitals filed for this visit. ? ? Subjective Assessment - 08/03/21 1725   ? ? Subjective Patient reports his back pain is severe and limiting him today.   ? Patient is accompained by: Family member   ? Pertinent History Paul Hall is a 19yoM who presents with severe weakness, quadriparesis, altered sensorium, and visual impairment s/p critical illness and prolonged hospitalization. Pt hospitalized in October 2020 with ARDS 2/2 COVID19 infection. Pt sustained a complex and lengthy hospitalization which included tracheostomy, prolonged sedation, ECMO. In this period pt sustained CVA and SDH. Pt has now been liberated from tracheostomy and G-tube. Pt has since been hospitalized for wound infection and UTI. Pt  lives with parents at home, has Hall bed and left chair, hoyer lift transfers, and power WC for mobility needs. Pt needs heavy physical assistance with ADL 2/2 BUE contractures and motor dysfunction.   ? Limitations Lifting;Standing;Walking;Writing;Reading;Sitting;House hold activities   ? How long can you sit comfortably? Patient reports uable to sit up long < 4 hours due to buttock discomfort   ? How long can you stand comfortably? Patient is currently unable to stand   ? How long can you walk comfortably? Patient is currently unable to walk   ? Diagnostic tests No recent relavent diagnostic tests   ? Patient Stated Goals I want to be able to possibly walk again   ? Currently in Pain? Yes   ? Pain Score 8    ? Pain Location Back   ? Pain Orientation Lower   ? Pain Descriptors / Indicators Aching;Tightness   ? Pain Type Chronic pain   ? Pain Onset In the past 7 days   ? Pain Frequency Intermittent   ? ?  ?  ? ?  ? ? ? ? ? ? ?INTERVENTIONS:  ?  ?In chair:  ?  ?Seated manual resist hip add/abd x 20 reps each.  ?Seated resisted hamstring curl 10x each LE  ?Hamstring lengthening stretch 60 seconds each LE x 2 trials ?Single knee to chest stretch 60 seconds each LE x  2 trials  ?Adductor lengthening stretch 60 seconds each LE ?Posterior pelvic tilt 10x 3 second holds ; 2 sets  ?TrA activation pressing into green swiss ball with Ue's and LE's 10x 2 sets of 3 second holds  ?Isometric press into dynadisc 10x each LE x 2 trials each LE ?GTB adduction 10x each LE ?GTB resisted pf 10x each LE  ?  ?Education provided throughout session via VC/TC and demonstration to facilitate movement at target joints and correct muscle activation for all testing and exercises performed.  ?  ? ?Patient presents with significant back pain that is reduced but not relieved with heat and posterior pelvic tilts. Patient is unable to transfer from chair due to pain this session but tolerates all chair interventions well. Pt will continue to  benefit from skilled PT intervention to improve his overall function, weight shifting and postiion change ability, LE strength, trunk strength and improve his overall quality of life ? ? ? ? ? ? ? ? ? ? ? ? ? ? ? ? ? ? ? ? PT Education - 08/03/21 1553   ? ? Education Details exercise technique, body mechanics   ? Person(s) Educated Patient   ? Methods Explanation;Demonstration;Tactile cues;Verbal cues   ? Comprehension Verbalized understanding;Returned demonstration;Verbal cues required;Tactile cues required   ? ?  ?  ? ?  ? ? ? PT Short Term Goals - 05/10/21 1413   ? ?  ? PT SHORT TERM GOAL #1  ? Title Pt will be independent with HEP in order to improve strength and balance in order to decrease fall risk and improve function at home and work.   ? Baseline 01/04/2021= No formal HEP in place; 12/12 no HEP in place; 05/10/2021-Patient and his father were able to report compliance with curent HEP consisting of mostly seated/reclined LE strengthening. Both verbalize no questions at this time.   ? Time 6   ? Period Weeks   ? Status Achieved   ? Target Date 02/15/21   ? ?  ?  ? ?  ? ? ? ? PT Long Term Goals - 06/21/21 0848   ? ?  ? PT LONG TERM GOAL #1  ? Title Patient will increase BLE gross strength by 1/2 muscle grade to improve functional strength for improved independence with potential gait, increased standing tolerance and increased ADL ability.   ? Baseline 01/04/2021- Patient presents with 1/5 to 3-/5 B LE strength with MMT; 12/12: goal partially met for Left knee/hip; 05/10/2021= 2-/5 bilateal Hip flex; 3+/5 bilateral Knee ext; 06/21/2021= Patient presents with 2-/5 bilateral Hip flex; 3+/5 bilateral knee ext/flex; 2-/5 left ankle DF; 0/5 right ankle- and able to increase reps and resistance with LE's   ? Time 12   ? Period Weeks   ? Status Partially Met   ? Target Date 03/29/21   ?  ? PT LONG TERM GOAL #2  ? Title Patient will tolerate sitting unsupported demonstrating erect sitting posture for 15 minutes with CGA to  demonstrate improved back extensor strength and improved sitting tolerance.   ? Baseline 01/04/2021- Patient confied to sitting in lift chair or electric power chair with back support and unable to sit upright without physical assistance; 12/12: tolerates <1 minutes upright unsupported sitting. 05/10/2021=static sit with forward trunk lean  in his power wheelchair without back support x approx 3 min. 06/21/2021=Unable to assess today due to patient with acute back pain but on previous visit able to sit x 8 min without back support.   ?  Time 12   ? Period Weeks   ? Status On-going   ? Target Date 09/13/21   ?  ? PT LONG TERM GOAL #3  ? Title Patient will demonstrate ability to perform static standing in // bars > 2 min with Max Assist  without loss of balance and fair posture for improved overall strength for pre-gait and transfer activities.   ? Baseline 01/04/2021= Patient current uanble to stand- Dependent on hoyer or sit to stand lift for transfers. 05/10/2021=Not appropriate yet- Currently still dependent with all transfers using hoyer. 06/21/2021= Patient continuing now to focus on LE strengthening to prepare for standing-unable to try today due to acute low back pain-  planning on attempting in new cert period.   ? Time 12   ? Period Weeks   ? Status On-going   ? Target Date 09/13/21   ?  ? PT LONG TERM GOAL #4  ? Title Pt will improve FOTO score by 10 points or more demonstrating improved perceived functional ability   ? Baseline FOTO 7 on 10/17; 03/15/21: FOTO 12; 05/10/2021 06/21/2021= 1   ? Time 12   ? Period Weeks   ? Status On-going   ? Target Date 03/29/21   ? ?  ?  ? ?  ? ? ? ? ? ? ? ? Plan - 08/03/21 1729   ? ? Clinical Impression Statement Patient presents with significant back pain that is reduced but not relieved with heat and posterior pelvic tilts. Patient is unable to transfer from chair due to pain this session but tolerates all chair interventions well. Pt will continue to benefit from skilled PT  intervention to improve his overall function, weight shifting and postiion change ability, LE strength, trunk strength and improve his overall quality of life   ? Personal Factors and Comorbidities Comorbidity 3

## 2021-08-03 NOTE — Therapy (Signed)
[place] [facility] ?[address] ?[place], [place], [address] ?Phone: [phone]   Fax:  [phone] ? ?Occupational Therapy Treatment ? ?Patient Details  ?Name: Paul BolderSamuel Hakeem Hall ?MRN: [id number] ?Date of Birth: 18-Jul-1995 ?Referring Provider (OT): Lenise HeraldBenjamin Mann ? ? ?Encounter Date: 08/02/2021 ? ? OT End of Session - 08/03/21 1158   ? ? Visit Number 47   ? Number of Visits 72   ? Date for OT Re-Evaluation 09/08/21   ? Authorization Type Progress report period starting 06/28/21   ? OT Start Time 1600   ? OT Stop Time 1645   ? OT Time Calculation (min) 45 min   ? Activity Tolerance Patient tolerated treatment well   ? Behavior [facility] for tasks assessed/performed   ? ?  ?  ? ?  ? ? ?Past Medical History:  ?Diagnosis Date  ? Diabetes mellitus ([facility])   ? Hypertension   ? Stroke [facility])   ? ? ?Past Surgical History:  ?Procedure Laterality Date  ? IVC FILTER PLACEMENT ([facility] HX)    ? LEG SURGERY    ? PEG TUBE PLACEMENT    ? TRACHEOSTOMY    ? ? ?There were no vitals filed for this visit. ? ? Subjective Assessment - 08/03/21 1156   ? ? Subjective  Pt reports having a good weekend.  ? Patient is accompanied by: Family member   ? Pertinent History Pt. is a 26 y.o. male who was hospitalized, and diagnosed with COVID-19, sustained a CVA with resultant quadreplegia in 01/2019. Pt. was hospitalized with VRE  UTI. Pt. PMHx includes: urinary retention, Seizure Disorder, Obstructive Sleep Disorder, DM Type II, and Morbid obesity. Pt. resides at home with his parents.   ? Currently in Pain? No/denies   ? ?  ?  ? ?  ? ?OT TREATMENT   ? ?Manual Therapy: ?  ?Pt. tolerated soft tissue massage to the dorsal, and volar surface of the right 4th digit 2/2 pain, and stiffness. Manual therapy was performed independent of, and in preparation for ROM and therapeutic Ex.  ?  ?Therapeutic Exercise: ?  ?Pt. tolerated AROM/AAROM/PROM to the bilateral shoulders, bilateral wrist flexion,  extension, and MP, PIP, and DIP flexion, and extension without reports of pain this afternoon. Patient tolerated moist heat modality to his bilateral hands prior to range of motion.  ?  ?Pt. is scheduled for Botox injections later this month. Pt. required one reclining rest break secondary to to his back. Pt. tolerated manual therapy, and and ROM with no reports of pain. Pt. presents with flexor tightness in the digits. Pt. continues to benefit from OT services  to improve ROM, and facilitate active volitional movement in order to work towards engaging his BUEs in daily ADLs, and IADL tasks.  ?  ?  ? ? ? ? ? ? ? ? ? ? ? ? ? ? ? ? ? ? ? ? ? OT Education - 08/03/21 1158   ? ? Education Details AAROM for shoulders using 1# dowel   ? Person(s) Educated Patient   ? Methods Verbal cues;Tactile cues;Explanation;Demonstration   ? Comprehension Verbalized understanding;Verbal cues required;Need further instruction;Returned demonstration;Tactile cues required   ? ?  ?  ? ?  ? ? ? ? ? ? OT Long Term Goals - 06/23/21 1555   ? ?  ? OT LONG TERM GOAL #1  ? Title Pt. will improve FOTO score by 5 points for clinically significant improvements during ADLs, and  IADLs,   ? Baseline 30th visit: FOTO: 37, 21st: FOTO 43. 10th Visit: FOTO score 42 Eval: FOTO score: 41 FOTO score: 39   ? Time 12   ? Period Weeks   ? Status On-going   ? Target Date 09/08/21   ?  ? OT LONG TERM GOAL #3  ? Title Pt. will improve right Abduction by 10 degrees to assist caregivers with underarm hygiene care.   ? Baseline Eval: AROM abduction RUE 76. Pt. continues to present with limited bilateral abduction. 21st: AROM abduction RUE 80.06/16/2021: Right shulder abduction 108. 06/23/2021: Right shoulder abduction: 110   ? Time 12   ? Period Weeks   ? Status On-going   ? Target Date 09/08/21   ?  ? OT LONG TERM GOAL #4  ? Title Pt. will demonstrate visual compensatory strategies for navigating through his environment.   ? Baseline 30th visit: Pt. continues to  require assist to navigate through his environment. 20th: Pt continues to require assist to navigate environment, minimal knowledge of strategies. Eval: Limited 06/23/2021: Pt. continues to be limited, and requires assist  with navigating through his environment.   ? Time 12   ? Period Weeks   ? Status On-going   ? Target Date 09/08/21   ?  ? OT LONG TERM GOAL #5  ? Title Pt. will demonstrate visual compensatory strategies for tabletop tasks within his near space.   ? Baseline 06/16/2021: Pt. continues to present with visual limitations for tabletop tasks in his near space. Pt. continues to present with limited vision. 21st: Pt can identify objects on tabletop ~waterbottle sized, improves with contrasting colours, cannot ID small onjects   ? Time 12   ? Period Weeks   ? Status On-going   ? Target Date 09/08/21   ?  ? OT LONG TERM GOAL #6  ? Title Pt. will perform hand to mouth patterns with supervision in preparation for self-feeding.   ? Baseline 06/23/2021: Pt. is able to use his bilateral hands to perfrom hand to mouth patterns for self-feeding with finger foods. Pt. is now able to use his left hand to bring a water bottle to his mouth,a nd drink from it. 30th visit: Pt. continues to present with weakness limiting functional hand to mouth patterns of movement. Pt. is able to perform hand to mouth patterns, however reports weakness in his hand  limits a secure grasp on the utensil. Pt. requires assist to perform. Eval: Pt. has difficulty.   ? Time 12   ? Period Weeks   ? Target Date 09/08/21   ?  ? OT LONG TERM GOAL #7  ? Title Pt. will perform one step self-grooming tasks with minA.   ? Baseline 06/23/2021: Pt. is able to perform the hand to face pattern with the left hand, however has difficulty actively sustaining his BUEs in elevation long enough to perform self-grooming tasks. 06/16/2021: Pt. is able to perform the hand to face pattern with the left hand, however has difficulty actively sustaining his BUEs in  elevation long enough to perform self-grooming tasks.   ? Time 12   ? Period Weeks   ? Status On-going   ? Target Date 09/08/21   ?  ? OT LONG TERM GOAL #8  ? Title Pt will complete UB dressing tasks with SETUP and assist to thread over hands only   ? Baseline 06/23/2021: Pt. requires MinA to thread arms, and requires maxA to donn over upper arms, MaxAx2 to donn  over his back. 06/16/2021: Pt. has progressed  to require MinA to thread arms, and requires maxA to donn over upper arms, MaxAx2 to donn over his back.   ? Time 12   ? Period Weeks   ? Status On-going   ? Target Date 09/08/21   ? ?  ?  ? ?  ? ? ? ? ? ? ? ? Plan - 08/03/21 1209   ? ? Clinical Impression Statement Pt. is scheduled for Botox injections later this month. Pt. required one reclining rest break secondary to to his back. Pt. tolerated manual therapy, and and ROM with no reports of pain. Pt. presents with flexor tightness in the digits. Pt. continues to benefit from OT services  to improve ROM, and facilitate active volitional movement in order to work towards engaging his BUEs in daily ADLs, and IADL tasks.  ?   ? OT Occupational Profile and History Detailed Assessment- Review of Records and additional review of physical, cognitive, psychosocial history related to current functional performance   ? Occupational performance deficits (Please refer to evaluation for details): ADL's;IADL's   ? Body Structure / Function / Physical Skills ADL;FMC;ROM;Dexterity;IADL;Endurance;Obesity;Strength;UE functional use   ? Rehab Potential Good   ? Clinical Decision Making Multiple treatment options, significant modification of task necessary   ? Comorbidities Affecting Occupational Performance: Presence of comorbidities impacting occupational performance   ? Modification or Assistance to Complete Evaluation  Max significant modification of tasks or assist is necessary to complete   ? OT Frequency 2x / week   ? OT Duration 12 weeks   ? OT Treatment/Interventions  Self-care/ADL training;Therapeutic exercise;Neuromuscular education;Visual/perceptual remediation/compensation;Patient/family education;Therapeutic activities;DME and/or AE instruction;Passive range of motion;Moist Heat

## 2021-08-04 ENCOUNTER — Ambulatory Visit: Payer: BC Managed Care – PPO | Admitting: Occupational Therapy

## 2021-08-04 ENCOUNTER — Ambulatory Visit: Payer: BC Managed Care – PPO

## 2021-08-05 ENCOUNTER — Ambulatory Visit: Payer: BC Managed Care – PPO

## 2021-08-05 ENCOUNTER — Ambulatory Visit: Payer: BC Managed Care – PPO | Admitting: Occupational Therapy

## 2021-08-05 DIAGNOSIS — G4733 Obstructive sleep apnea (adult) (pediatric): Secondary | ICD-10-CM | POA: Diagnosis not present

## 2021-08-05 DIAGNOSIS — L8994 Pressure ulcer of unspecified site, stage 4: Secondary | ICD-10-CM | POA: Diagnosis not present

## 2021-08-05 DIAGNOSIS — I6389 Other cerebral infarction: Secondary | ICD-10-CM | POA: Diagnosis not present

## 2021-08-09 ENCOUNTER — Ambulatory Visit: Payer: BC Managed Care – PPO

## 2021-08-09 ENCOUNTER — Ambulatory Visit: Payer: BC Managed Care – PPO | Admitting: Occupational Therapy

## 2021-08-09 DIAGNOSIS — I6930 Unspecified sequelae of cerebral infarction: Secondary | ICD-10-CM

## 2021-08-09 DIAGNOSIS — M6281 Muscle weakness (generalized): Secondary | ICD-10-CM | POA: Diagnosis not present

## 2021-08-09 DIAGNOSIS — R262 Difficulty in walking, not elsewhere classified: Secondary | ICD-10-CM | POA: Diagnosis not present

## 2021-08-09 DIAGNOSIS — R278 Other lack of coordination: Secondary | ICD-10-CM

## 2021-08-09 DIAGNOSIS — R2689 Other abnormalities of gait and mobility: Secondary | ICD-10-CM | POA: Diagnosis not present

## 2021-08-09 DIAGNOSIS — R2681 Unsteadiness on feet: Secondary | ICD-10-CM | POA: Diagnosis not present

## 2021-08-09 DIAGNOSIS — R269 Unspecified abnormalities of gait and mobility: Secondary | ICD-10-CM | POA: Diagnosis not present

## 2021-08-10 ENCOUNTER — Ambulatory Visit: Payer: BC Managed Care – PPO

## 2021-08-10 NOTE — Therapy (Signed)
[place] ?[facility] ?[address] ?[place], [place], [address] ?Phone: [phone]   Fax:  [phone] ? ?Occupational Therapy Treatment ? ?Patient Details  ?Name: Paul Hall ?MRN: [id number] ?Date of Birth: 04-Sep-1995 ?Referring Provider (OT): Lenise Herald ? ? ?Encounter Date: 08/09/2021 ? ? OT End of Session - 08/10/21 1636   ? ? Visit Number 48   ? Number of Visits 72   ? Date for OT Re-Evaluation 09/08/21   ? Authorization Type Progress report period starting 06/28/21   ? OT Start Time 1545   ? OT Stop Time 1630   ? OT Time Calculation (min) 45 min   ? Equipment Utilized During Treatment tilt in space power wc   ? Activity Tolerance Patient tolerated treatment well   ? Behavior During Therapy [facility] for tasks assessed/performed   ? ?  ?  ? ?  ? ? ?Past Medical History:  ?Diagnosis Date  ? Diabetes mellitus (HCC)   ? Hypertension   ? Stroke [facility])   ? ? ?Past Surgical History:  ?Procedure Laterality Date  ? IVC FILTER PLACEMENT ([facility] HX)    ? LEG SURGERY    ? PEG TUBE PLACEMENT    ? TRACHEOSTOMY    ? ? ?There were no vitals filed for this visit. ? ? Subjective Assessment - 08/09/21 1635   ? ? Subjective  "I'm doing well today."   ? Patient is accompanied by: Family member   ? Pertinent History Pt. is a 26 y.o. male who was hospitalized, and diagnosed with COVID-19, sustained a CVA with resultant quadreplegia in 01/2019. Pt. was hospitalized with VRE  UTI. Pt. PMHx includes: urinary retention, Seizure Disorder, Obstructive Sleep Disorder, DM Type II, and Morbid obesity. Pt. resides at home with his parents.   ? Limitations BUE, and LE limitations with ROM, strength, motor control, and [facility] skills.   ? Patient Stated Goals To improve his arms, and hands.   ? Currently in Pain? No/denies   ? Pain Onset In the past 7 days   ? Pain Onset More than a month ago   ? ?  ?  ? ?  ? ?Occupational Therapy Treatment: ?Moist heat applied to R/L hands x 5 min for pain  reduction/muscle relaxation in prep for passive stretching and grasp/release activities.  ? ?Therapeutic Exercise: ?Pt. tolerated slow, prolonged passive stretching for bilateral wrist flexion, extension, and MP, PIP, and DIP flexion, and extension.  Performed passive R/L shoulder ER, shoulder flex, abduction, and elbow flex/extension, working to increase functional range for self care.  Performed AAROM for shoulder flex, abd, horiz abd/add, ER x10 each side, working to increase reach for UB ADLs.  Participated in reaching for a built up pen in a variety of ranges at shoulder level with R/L.   ? ?Response to Treatment: ?See Plan/clinical impression below. ? ? ? OT Education - 08/09/21 1636   ? ? Education Details AAROM for shoulders   ? Person(s) Educated Patient   ? Methods Verbal cues;Tactile cues;Explanation;Demonstration   ? Comprehension Verbalized understanding;Verbal cues required;Need further instruction;Returned demonstration;Tactile cues required   ? ?  ?  ? ?  ? ? ? ? ? ? OT Long Term Goals - 06/23/21 1555   ? ?  ? OT LONG TERM GOAL #1  ? Title Pt. will improve FOTO score by 5 points for clinically significant improvements during ADLs, and IADLs,   ? Baseline 30th visit: FOTO: 37, 21st: FOTO 43. 10th  Visit: FOTO score 42 Eval: FOTO score: 41 FOTO score: 39   ? Time 12   ? Period Weeks   ? Status On-going   ? Target Date 09/08/21   ?  ? OT LONG TERM GOAL #3  ? Title Pt. will improve right Abduction by 10 degrees to assist caregivers with underarm hygiene care.   ? Baseline Eval: AROM abduction RUE 76. Pt. continues to present with limited bilateral abduction. 21st: AROM abduction RUE 80.06/16/2021: Right shulder abduction 108. 06/23/2021: Right shoulder abduction: 110   ? Time 12   ? Period Weeks   ? Status On-going   ? Target Date 09/08/21   ?  ? OT LONG TERM GOAL #4  ? Title Pt. will demonstrate visual compensatory strategies for navigating through his environment.   ? Baseline 30th visit: Pt. continues to  require assist to navigate through his environment. 20th: Pt continues to require assist to navigate environment, minimal knowledge of strategies. Eval: Limited 06/23/2021: Pt. continues to be limited, and requires assist  with navigating through his environment.   ? Time 12   ? Period Weeks   ? Status On-going   ? Target Date 09/08/21   ?  ? OT LONG TERM GOAL #5  ? Title Pt. will demonstrate visual compensatory strategies for tabletop tasks within his near space.   ? Baseline 06/16/2021: Pt. continues to present with visual limitations for tabletop tasks in his near space. Pt. continues to present with limited vision. 21st: Pt can identify objects on tabletop ~waterbottle sized, improves with contrasting colours, cannot ID small onjects   ? Time 12   ? Period Weeks   ? Status On-going   ? Target Date 09/08/21   ?  ? OT LONG TERM GOAL #6  ? Title Pt. will perform hand to mouth patterns with supervision in preparation for self-feeding.   ? Baseline 06/23/2021: Pt. is able to use his bilateral hands to perfrom hand to mouth patterns for self-feeding with finger foods. Pt. is now able to use his left hand to bring a water bottle to his mouth,a nd drink from it. 30th visit: Pt. continues to present with weakness limiting functional hand to mouth patterns of movement. Pt. is able to perform hand to mouth patterns, however reports weakness in his hand  limits a secure grasp on the utensil. Pt. requires assist to perform. Eval: Pt. has difficulty.   ? Time 12   ? Period Weeks   ? Target Date 09/08/21   ?  ? OT LONG TERM GOAL #7  ? Title Pt. will perform one step self-grooming tasks with minA.   ? Baseline 06/23/2021: Pt. is able to perform the hand to face pattern with the left hand, however has difficulty actively sustaining his BUEs in elevation long enough to perform self-grooming tasks. 06/16/2021: Pt. is able to perform the hand to face pattern with the left hand, however has difficulty actively sustaining his BUEs in  elevation long enough to perform self-grooming tasks.   ? Time 12   ? Period Weeks   ? Status On-going   ? Target Date 09/08/21   ?  ? OT LONG TERM GOAL #8  ? Title Pt will complete UB dressing tasks with SETUP and assist to thread over hands only   ? Baseline 06/23/2021: Pt. requires MinA to thread arms, and requires maxA to donn over upper arms, MaxAx2 to donn over his back. 06/16/2021: Pt. has progressed  to require MinA to thread  arms, and requires maxA to donn over upper arms, MaxAx2 to donn over his back.   ? Time 12   ? Period Weeks   ? Status On-going   ? Target Date 09/08/21   ? ?  ?  ? ?  ? ? ? Plan - 08/09/21 1645   ? ? Clinical Impression Statement Good tolerance to heat, passive and AAROM to BUEs, and functional reaching for a pen using R/L Ues.  Improved grasp/release with L hand vs R d/t reduce tone in this side, but pt still requires set up of pen in hand to minimize dropping frequency.  Pt continues to benefit from OT services to improve ROM, and facilitate active volitional movement in order to work towards engaging his BUEs into ADLs.   ? OT Occupational Profile and History Detailed Assessment- Review of Records and additional review of physical, cognitive, psychosocial history related to current functional performance   ? Occupational performance deficits (Please refer to evaluation for details): ADL's;IADL's   ? Body Structure / Function / Physical Skills ADL;FMC;ROM;Dexterity;IADL;Endurance;Obesity;Strength;UE functional use   ? Rehab Potential Good   ? Clinical Decision Making Multiple treatment options, significant modification of task necessary   ? Comorbidities Affecting Occupational Performance: Presence of comorbidities impacting occupational performance   ? Modification or Assistance to Complete Evaluation  Max significant modification of tasks or assist is necessary to complete   ? OT Frequency 2x / week   ? OT Duration 12 weeks   ? OT Treatment/Interventions Self-care/ADL  training;Therapeutic exercise;Neuromuscular education;Visual/perceptual remediation/compensation;Patient/family education;Therapeutic activities;DME and/or AE instruction;Passive range of motion;Moist Heat;Electrical Stimulati

## 2021-08-11 ENCOUNTER — Encounter: Payer: Self-pay | Admitting: Physical Therapy

## 2021-08-11 ENCOUNTER — Ambulatory Visit: Payer: BC Managed Care – PPO

## 2021-08-11 ENCOUNTER — Ambulatory Visit: Payer: BC Managed Care – PPO | Admitting: Physical Therapy

## 2021-08-11 ENCOUNTER — Ambulatory Visit: Payer: BC Managed Care – PPO | Admitting: Occupational Therapy

## 2021-08-11 DIAGNOSIS — I6930 Unspecified sequelae of cerebral infarction: Secondary | ICD-10-CM | POA: Diagnosis not present

## 2021-08-11 DIAGNOSIS — R2689 Other abnormalities of gait and mobility: Secondary | ICD-10-CM

## 2021-08-11 DIAGNOSIS — R278 Other lack of coordination: Secondary | ICD-10-CM

## 2021-08-11 DIAGNOSIS — R262 Difficulty in walking, not elsewhere classified: Secondary | ICD-10-CM | POA: Diagnosis not present

## 2021-08-11 DIAGNOSIS — R2681 Unsteadiness on feet: Secondary | ICD-10-CM

## 2021-08-11 DIAGNOSIS — R269 Unspecified abnormalities of gait and mobility: Secondary | ICD-10-CM | POA: Diagnosis not present

## 2021-08-11 DIAGNOSIS — M6281 Muscle weakness (generalized): Secondary | ICD-10-CM

## 2021-08-11 NOTE — Therapy (Signed)
[place] ?[facility] ?[place], [place], [address] ?Phone: [phone]   Fax:  [phone] ? ?Physical Therapy Treatment ? ?Patient Details  ?Name: Paul Hall ?MRN: [id number] ?Date of Birth: 1995-09-24 ?Referring Provider (PT): Paul Mares, PA-C ? ? ?Encounter Date: 08/11/2021 ? ? PT End of Session - 08/11/21 1438   ? ? Visit Number 39   ? Number of Visits 37   ? Date for PT Re-Evaluation 09/13/21   ? Authorization Type [employer] and [employer]- Need auth past 12th visit   ? Authorization Time Period 01/04/21-03/29/21; Recert [id number]   ? Progress Note Due on Visit 40   ? PT Start Time 1434   ? PT Stop Time 1515   ? PT Time Calculation (min) 41 min   ? Equipment Utilized During Treatment --   [employer] lift  ? Activity Tolerance Patient tolerated treatment well   queasiness/nausea  ? Behavior During Therapy [facility] for tasks assessed/performed   ? ?  ?  ? ?  ? ? ?Past Medical History:  ?Diagnosis Date  ? Diabetes mellitus ([place])   ? Hypertension   ? Stroke [facility])   ? ? ?Past Surgical History:  ?Procedure Laterality Date  ? IVC FILTER PLACEMENT ([facility] HX)    ? LEG SURGERY    ? PEG TUBE PLACEMENT    ? TRACHEOSTOMY    ? ? ?There were no vitals filed for this visit. ? ? Subjective Assessment - 08/11/21 1437   ? ? Subjective Pt reports his back is not as sore. He is having a little tightness; Reports his lovenox meds decreased to 30m; He is still taking a baby aspirin but they are hoping to wean off of that if possible.   ? Patient is accompained by: Family member   ? Pertinent History Paul Patriarcais a 89yoMwho presents with severe weakness, quadriparesis, altered sensorium, and visual impairment s/p critical illness and prolonged hospitalization. Pt hospitalized in October 2020 with ARDS 2/2 COVID19 infection. Pt sustained a complex and lengthy hospitalization which included tracheostomy, prolonged sedation, ECMO. In this period pt  sustained CVA and SDH. Pt has now been liberated from tracheostomy and G-tube. Pt has since been hospitalized for wound infection and UTI. Pt lives with parents at home, has hospital bed and left chair, hoyer lift transfers, and power WC for mobility needs. Pt needs heavy physical assistance with ADL 2/2 BUE contractures and motor dysfunction.   ? Limitations Lifting;Standing;Walking;Writing;Reading;Sitting;House hold activities   ? How long can you sit comfortably? Patient reports uable to sit up long < 4 hours due to buttock discomfort   ? How long can you stand comfortably? Patient is currently unable to stand   ? How long can you walk comfortably? Patient is currently unable to walk   ? Diagnostic tests No recent relavent diagnostic tests   ? Patient Stated Goals I want to be able to possibly walk again   ? Currently in Pain? Yes   ? Pain Score 1    ? Pain Onset In the past 7 days   ? ?  ?  ? ?  ? ? ? ? ? ? ?  ?  ?  ?INTERVENTIONS:  ?  ?In chair:  ? ?Seated resisted hamstring curl green tband  15x each LE  ?Hamstring lengthening stretch 60 seconds each LE x 2 trials ?Single knee to chest stretch 60 seconds each LE x 1 trials  ?LAQ x5 reps, reclined ?  Progressed to BLE LAQ with ball between feet x5 reps reclined ? Progressed to sitting for increased ROM x5 reps, required AAROM to hold ball between feet;  ?Soccer ball squeeze adduction 10x each LE ?Hip flexion AROM x10 reps ? Progressed to quick movement to try and push ball up x5 reps ?  ?Trunk rotation punches: ?X10 slow ?2x10 fast ?Isolated RUE to focus on elbow extension while sitting forward in chair x10 reps; ?Patient able to complete sitting forward away from back of chair to challenge trunk control. He does require short rests between sets for recovery to maintain proper positioning;  ? ?Education provided throughout session via VC/TC and demonstration to facilitate movement at target joints and correct muscle activation for all testing and exercises  performed.  ?  ?He tolerated session well reporting minimal back discomfort and minimal pain;   ? ? ? ? ? ? ? ? ? ? ? ? ? ? ? ? ? ? ? ? ? ? PT Education - 08/11/21 1439   ? ? Education Details exercise technique/body mechanics;   ? Person(s) Educated Patient   ? Methods Explanation;Verbal cues   ? Comprehension Verbalized understanding;Returned demonstration;Verbal cues required;Need further instruction   ? ?  ?  ? ?  ? ? ? PT Short Term Goals - 05/10/21 1413   ? ?  ? PT SHORT TERM GOAL #1  ? Title Pt will be independent with HEP in order to improve strength and balance in order to decrease fall risk and improve function at home and work.   ? Baseline 01/04/2021= No formal HEP in place; 12/12 no HEP in place; 05/10/2021-Patient and his father were able to report compliance with curent HEP consisting of mostly seated/reclined LE strengthening. Both verbalize no questions at this time.   ? Time 6   ? Period Weeks   ? Status Achieved   ? Target Date 02/15/21   ? ?  ?  ? ?  ? ? ? ? PT Long Term Goals - 06/21/21 0848   ? ?  ? PT LONG TERM GOAL #1  ? Title Patient will increase BLE gross strength by 1/2 muscle grade to improve functional strength for improved independence with potential gait, increased standing tolerance and increased ADL ability.   ? Baseline 01/04/2021- Patient presents with 1/5 to 3-/5 B LE strength with MMT; 12/12: goal partially met for Left knee/hip; 05/10/2021= 2-/5 bilateal Hip flex; 3+/5 bilateral Knee ext; 06/21/2021= Patient presents with 2-/5 bilateral Hip flex; 3+/5 bilateral knee ext/flex; 2-/5 left ankle DF; 0/5 right ankle- and able to increase reps and resistance with LE's   ? Time 12   ? Period Weeks   ? Status Partially Met   ? Target Date 03/29/21   ?  ? PT LONG TERM GOAL #2  ? Title Patient will tolerate sitting unsupported demonstrating erect sitting posture for 15 minutes with CGA to demonstrate improved back extensor strength and improved sitting tolerance.   ? Baseline 01/04/2021- Patient  confied to sitting in lift chair or electric power chair with back support and unable to sit upright without physical assistance; 12/12: tolerates <1 minutes upright unsupported sitting. 05/10/2021=static sit with forward trunk lean  in his power wheelchair without back support x approx 3 min. 06/21/2021=Unable to assess today due to patient with acute back pain but on previous visit able to sit x 8 min without back support.   ? Time 12   ? Period Weeks   ? Status On-going   ?  Target Date 09/13/21   ?  ? PT LONG TERM GOAL #3  ? Title Patient will demonstrate ability to perform static standing in // bars > 2 min with Max Assist  without loss of balance and fair posture for improved overall strength for pre-gait and transfer activities.   ? Baseline 01/04/2021= Patient current uanble to stand- Dependent on hoyer or sit to stand lift for transfers. 05/10/2021=Not appropriate yet- Currently still dependent with all transfers using hoyer. 06/21/2021= Patient continuing now to focus on LE strengthening to prepare for standing-unable to try today due to acute low back pain-  planning on attempting in new cert period.   ? Time 12   ? Period Weeks   ? Status On-going   ? Target Date 09/13/21   ?  ? PT LONG TERM GOAL #4  ? Title Pt will improve FOTO score by 10 points or more demonstrating improved perceived functional ability   ? Baseline FOTO 7 on 10/17; 03/15/21: FOTO 12; 05/10/2021 06/21/2021= 1   ? Time 12   ? Period Weeks   ? Status On-going   ? Target Date 03/29/21   ? ?  ?  ? ?  ? ? ? ? ? ? Plan - 08/13/21 0842   ? ? Clinical Impression Statement Patient motivated and participated well within session. He reports less back pain today. He was instructed in LE strengthening exercise, progressing with BLE leg lift to challenge coordination and LE strength. He continues to exhibit increased weakness on right side compared to left side. Patient was able to tolerate sitting forward in chair with better trunk control challenging trunk  position with reaching outside base of support. He does fatigue quickly requiring short rest break to recover for better stability and exercise technique. Patient does require min VCS for proper positioning an

## 2021-08-12 ENCOUNTER — Ambulatory Visit: Payer: BC Managed Care – PPO

## 2021-08-12 ENCOUNTER — Ambulatory Visit: Payer: BC Managed Care – PPO | Admitting: Occupational Therapy

## 2021-08-12 NOTE — Therapy (Signed)
[place] ?[facility] ?[address] ?[place], [place], [address] ?Phone: [phone]   Fax:  [phone] ? ?Occupational Therapy Treatment ? ?Patient Details  ?Name: Paul Hall ?MRN: [id number] ?Date of Birth: Aug 14, 1995 ?Referring Provider (OT): Lenise Herald ? ? ?Encounter Date: 08/11/2021 ? ? OT End of Session - 08/12/21 1157   ? ? Visit Number [id number]   ? Number of Visits 72   ? Date for OT Re-Evaluation 09/08/21   ? Authorization Type Progress report period starting 06/28/21   ? OT Start Time 1545   ? OT Stop Time 1630   ? OT Time Calculation (min) 45 min   ? Equipment Utilized During Treatment tilt in space power wc   ? Activity Tolerance Patient tolerated treatment well   ? Behavior During Therapy [facility] for tasks assessed/performed   ? ?  ?  ? ?  ? ? ?Past Medical History:  ?Diagnosis Date  ? Diabetes mellitus (HCC)   ? Hypertension   ? Stroke [facility])   ? ? ?Past Surgical History:  ?Procedure Laterality Date  ? IVC FILTER PLACEMENT ([facility] HX)    ? LEG SURGERY    ? PEG TUBE PLACEMENT    ? TRACHEOSTOMY    ? ? ?There were no vitals filed for this visit. ? ? Subjective Assessment - 08/11/21 1155   ? ? Subjective  OT asked pt if he had looked into adapted video game controllers, but pt denied and stated he didn't feel like he has adequate vision at this time to try and play a video game.   ? Patient is accompanied by: Family member   ? Pertinent History Pt. is a 26 y.o. male who was hospitalized, and diagnosed with COVID-19, sustained a CVA with resultant quadreplegia in 01/2019. Pt. was hospitalized with VRE  UTI. Pt. PMHx includes: urinary retention, Seizure Disorder, Obstructive Sleep Disorder, DM Type II, and Morbid obesity. Pt. resides at home with his parents.   ? Limitations BUE, and LE limitations with ROM, strength, motor control, and [facility] skills.   ? Patient Stated Goals To improve his arms, and hands.   ? Currently in Pain? Yes   ? Pain Score 1    ? Pain  Location Back   ? Pain Orientation Lower   ? Pain Descriptors / Indicators Aching;Tightness   ? Pain Type Chronic pain   ? Pain Radiating Towards low back   ? Pain Onset In the past 7 days   ? Pain Frequency Intermittent   ? Aggravating Factors  prolonged positioning   ? Pain Relieving Factors repositioning   ? Effect of Pain on Daily Activities decreased tolerance for prolonged positioning   ? Multiple Pain Sites No   ? Pain Onset More than a month ago   ? ?  ?  ? ?  ? ?Occupational Therapy Treatment: ?Moist heat applied to R/L hands x 5 min for pain reduction/muscle relaxation in prep for passive stretching and grasp/release activities. ? ?Therapeutic Exercise: ?Pt. tolerated slow, prolonged passive stretching for bilateral wrist flexion, extension, and MP, PIP, and DIP flexion, and extension.  Performed passive R/L shoulder ER, shoulder flex, abduction, and elbow flex/extension, working to increase functional range for self care.  Performed AAROM for R shoulder flex, abd, and ER x10 each side, AROM to the L for same, working to increase reach for UB ADLs.  Participated in reaching for jumbo pegs with a forward reach at chest height and releasing peg  into bucket at lap height.  Pt able to perform independently on the L using a gross lateral pinch, and required set up assist of peg in R hand, active assist to reach for peg with RUE, and mod A to release peg from grip in the R hand d/t spasticity. ?  ?Response to Treatment: ?Pt demonstrating improved functional reach with both arms.  Spasticity continues to limit grasp/release activities, especially in the R hand.  Pt can use a lateral pinch on the L for a jumbo peg, but required set up of peg in R hand, active assist to reach for peg with RUE, and mod A to release peg from grip in the R hand d/t spasticity.  Pt still planning Botox injections at the end of this month.  Pt continues to benefit from OT services to improve ROM, and facilitate active volitional  movement in order to work towards engaging his BUEs into ADLs.  ? ? ? ? OT Education - 08/11/21 1157   ? ? Education Details AAROM for shoulders   ? Person(s) Educated Patient   ? Methods Verbal cues;Tactile cues;Explanation;Demonstration   ? Comprehension Verbalized understanding;Verbal cues required;Need further instruction;Returned demonstration;Tactile cues required   ? ?  ?  ? ?  ? ? ? ? ? ? OT Long Term Goals - 06/23/21 1555   ? ?  ? OT LONG TERM GOAL #1  ? Title Pt. will improve FOTO score by 5 points for clinically significant improvements during ADLs, and IADLs,   ? Baseline 30th visit: FOTO: 37, 21st: FOTO 43. 10th Visit: FOTO score 42 Eval: FOTO score: 41 FOTO score: 39   ? Time 12   ? Period Weeks   ? Status On-going   ? Target Date 09/08/21   ?  ? OT LONG TERM GOAL #3  ? Title Pt. will improve right Abduction by 10 degrees to assist caregivers with underarm hygiene care.   ? Baseline Eval: AROM abduction RUE 76. Pt. continues to present with limited bilateral abduction. 21st: AROM abduction RUE 80.06/16/2021: Right shulder abduction 108. 06/23/2021: Right shoulder abduction: 110   ? Time 12   ? Period Weeks   ? Status On-going   ? Target Date 09/08/21   ?  ? OT LONG TERM GOAL #4  ? Title Pt. will demonstrate visual compensatory strategies for navigating through his environment.   ? Baseline 30th visit: Pt. continues to require assist to navigate through his environment. 20th: Pt continues to require assist to navigate environment, minimal knowledge of strategies. Eval: Limited 06/23/2021: Pt. continues to be limited, and requires assist  with navigating through his environment.   ? Time 12   ? Period Weeks   ? Status On-going   ? Target Date 09/08/21   ?  ? OT LONG TERM GOAL #5  ? Title Pt. will demonstrate visual compensatory strategies for tabletop tasks within his near space.   ? Baseline 06/16/2021: Pt. continues to present with visual limitations for tabletop tasks in his near space. Pt. continues to  present with limited vision. 21st: Pt can identify objects on tabletop ~waterbottle sized, improves with contrasting colours, cannot ID small onjects   ? Time 12   ? Period Weeks   ? Status On-going   ? Target Date 09/08/21   ?  ? OT LONG TERM GOAL #6  ? Title Pt. will perform hand to mouth patterns with supervision in preparation for self-feeding.   ? Baseline 06/23/2021: Pt. is able to use  his bilateral hands to perfrom hand to mouth patterns for self-feeding with finger foods. Pt. is now able to use his left hand to bring a water bottle to his mouth,a nd drink from it. 30th visit: Pt. continues to present with weakness limiting functional hand to mouth patterns of movement. Pt. is able to perform hand to mouth patterns, however reports weakness in his hand  limits a secure grasp on the utensil. Pt. requires assist to perform. Eval: Pt. has difficulty.   ? Time 12   ? Period Weeks   ? Target Date 09/08/21   ?  ? OT LONG TERM GOAL #7  ? Title Pt. will perform one step self-grooming tasks with minA.   ? Baseline 06/23/2021: Pt. is able to perform the hand to face pattern with the left hand, however has difficulty actively sustaining his BUEs in elevation long enough to perform self-grooming tasks. 06/16/2021: Pt. is able to perform the hand to face pattern with the left hand, however has difficulty actively sustaining his BUEs in elevation long enough to perform self-grooming tasks.   ? Time 12   ? Period Weeks   ? Status On-going   ? Target Date 09/08/21   ?  ? OT LONG TERM GOAL #8  ? Title Pt will complete UB dressing tasks with SETUP and assist to thread over hands only   ? Baseline 06/23/2021: Pt. requires MinA to thread arms, and requires maxA to donn over upper arms, MaxAx2 to donn over his back. 06/16/2021: Pt. has progressed  to require MinA to thread arms, and requires maxA to donn over upper arms, MaxAx2 to donn over his back.   ? Time 12   ? Period Weeks   ? Status On-going   ? Target Date 09/08/21   ? ?  ?   ? ?  ? ? ? Plan - 08/11/21 1211   ? ? Clinical Impression Statement Pt demonstrating improved functional reach with both arms.  Spasticity continues to limit grasp/release activities, especially in the R hand.  Pt

## 2021-08-13 DIAGNOSIS — E669 Obesity, unspecified: Secondary | ICD-10-CM | POA: Diagnosis not present

## 2021-08-13 DIAGNOSIS — Z86718 Personal history of other venous thrombosis and embolism: Secondary | ICD-10-CM | POA: Diagnosis not present

## 2021-08-13 DIAGNOSIS — Z7901 Long term (current) use of anticoagulants: Secondary | ICD-10-CM | POA: Diagnosis not present

## 2021-08-13 DIAGNOSIS — Z95828 Presence of other vascular implants and grafts: Secondary | ICD-10-CM | POA: Diagnosis not present

## 2021-08-16 ENCOUNTER — Ambulatory Visit: Payer: BC Managed Care – PPO

## 2021-08-16 ENCOUNTER — Ambulatory Visit: Payer: BC Managed Care – PPO | Admitting: Physical Therapy

## 2021-08-16 ENCOUNTER — Ambulatory Visit: Payer: BC Managed Care – PPO | Admitting: Occupational Therapy

## 2021-08-16 DIAGNOSIS — R2681 Unsteadiness on feet: Secondary | ICD-10-CM

## 2021-08-16 DIAGNOSIS — I6930 Unspecified sequelae of cerebral infarction: Secondary | ICD-10-CM

## 2021-08-16 DIAGNOSIS — R2689 Other abnormalities of gait and mobility: Secondary | ICD-10-CM | POA: Diagnosis not present

## 2021-08-16 DIAGNOSIS — R278 Other lack of coordination: Secondary | ICD-10-CM | POA: Diagnosis not present

## 2021-08-16 DIAGNOSIS — R269 Unspecified abnormalities of gait and mobility: Secondary | ICD-10-CM | POA: Diagnosis not present

## 2021-08-16 DIAGNOSIS — M6281 Muscle weakness (generalized): Secondary | ICD-10-CM

## 2021-08-16 DIAGNOSIS — R262 Difficulty in walking, not elsewhere classified: Secondary | ICD-10-CM | POA: Diagnosis not present

## 2021-08-17 ENCOUNTER — Ambulatory Visit: Payer: BC Managed Care – PPO | Admitting: Occupational Therapy

## 2021-08-17 ENCOUNTER — Ambulatory Visit: Payer: BC Managed Care – PPO

## 2021-08-17 ENCOUNTER — Encounter: Payer: Self-pay | Admitting: Physical Therapy

## 2021-08-17 NOTE — Therapy (Signed)
[place] ?[facility] ?[address] ?[place], [place], [address] ?Phone: [phone]   Fax:  [phone] ? ?Occupational Therapy Treatment/Progress Note ?Reporting period starting 06/28/21-08/16/21 ? ?Patient Details  ?Name: Paul Hall ?MRN: [id number] ?Date of Birth: 10-16-1995 ?Referring Provider (OT): Lenise Herald ? ? ?Encounter Date: 08/16/2021 ? ? OT End of Session - 08/17/21 1701   ? ? Visit Number 50   ? Number of Visits 72   ? Date for OT Re-Evaluation 09/08/21   ? Authorization Type Progress report period starting 06/28/21   ? OT Start Time 1545   ? OT Stop Time 1630   ? OT Time Calculation (min) 45 min   ? Equipment Utilized During Treatment tilt in space power wc   ? Activity Tolerance Patient tolerated treatment well   ? Behavior During Therapy [facility] for tasks assessed/performed   ? ?  ?  ? ?  ? ? ?Past Medical History:  ?Diagnosis Date  ? Diabetes mellitus (HCC)   ? Hypertension   ? Stroke [facility])   ? ? ?Past Surgical History:  ?Procedure Laterality Date  ? IVC FILTER PLACEMENT ([facility] HX)    ? LEG SURGERY    ? PEG TUBE PLACEMENT    ? TRACHEOSTOMY    ? ? ?There were no vitals filed for this visit. ? ? Subjective Assessment - 08/16/21 1700   ? ? Subjective  Pt reports doing well today, just some back pain.   ? Patient is accompanied by: Family member   ? Pertinent History Pt. is a 26 y.o. male who was hospitalized, and diagnosed with COVID-19, sustained a CVA with resultant quadreplegia in 01/2019. Pt. was hospitalized with VRE  UTI. Pt. PMHx includes: urinary retention, Seizure Disorder, Obstructive Sleep Disorder, DM Type II, and Morbid obesity. Pt. resides at home with his parents.   ? Limitations BUE, and LE limitations with ROM, strength, motor control, and [facility] skills.   ? Patient Stated Goals To improve his arms, and hands.   ? Currently in Pain? Yes   ? Pain Score 3    ? Pain Location Back   ? Pain Orientation Lower   ? Pain Descriptors / Indicators  Aching;Tightness   ? Pain Type Chronic pain   ? Pain Radiating Towards low back   ? Pain Onset Today   ? Pain Frequency Intermittent   ? Aggravating Factors  prolonged positioning/movement   ? Pain Relieving Factors rest/repositioning   ? Effect of Pain on Daily Activities decreased tolerance for prolonged positioning or certain movements   ? Multiple Pain Sites No   ? Pain Onset More than a month ago   ? ?  ?  ? ?  ? ? ? ? ? [facility] OT Assessment - 08/17/21 1713   ? ?  ? Hand Function  ? Right Hand Grip (lbs) 0   ? Right Hand Lateral Pinch 2 lbs   ? Right Hand 3 Point Pinch 0 lbs   unable  ? Left Hand Grip (lbs) 0   ? Left Hand Lateral Pinch 1.5 lbs   ? Left 3 point pinch 0 lbs   unable  ? ?  ?  ? ?  ? ?Occupational Therapy Treatment/Progress Note: ?Objective measures taken, goals updated. Pt. tolerated slow, prolonged passive stretching for bilateral wrist flexion, extension, and MP, PIP, and DIP flexion, and extension, working to increase functional range for self care and manipulation of ADL supplies.  Completed lateral pinching R/L  x2 sets 10 reps each, squeezing into therapist's hand.   ? ?Response to Treatment: ?Pt with increased FOTO score at 52, up from 39.  Pt continues to present with significant BUE spasticity which limits manipulation of ADL supplies as hands are most effected.  Pt has upcoming Botox injections scheduled end of this month.  Pt continues to benefit from OT services to improve ROM, and facilitate active volitional movement in order to work towards engaging his BUEs into ADLs.  ? ? ? OT Education - 08/16/21 1701   ? ? Education Details progress towards OT goals   ? Person(s) Educated Patient;Parent(s)   ? Methods Verbal cues;Tactile cues;Explanation;Demonstration   ? Comprehension Verbalized understanding;Verbal cues required;Need further instruction   ? ?  ?  ? ?  ? ? ? ? ? ? OT Long Term Goals - 08/16/21 1553   ? ?  ? OT LONG TERM GOAL #1  ? Title Pt. will improve FOTO score by 5 points for  clinically significant improvements during ADLs, and IADLs,   ? Baseline 30th visit: FOTO: 37, 21st: FOTO 43. 10th Visit: FOTO score 42 Eval: FOTO score: 41 FOTO score: 39; 50th: FOTO 52   ? Time 12   ? Period Weeks   ? Status On-going   ? Target Date 09/08/21   ?  ? OT LONG TERM GOAL #2  ? Title Pt. will improve right shoulder flexion by 10 degrees to assist caregivers with UE dressing   ? Baseline Pt. continues to require assist with UE dressing secondary to limited ROM; 08/16/21: Max A-total A UB dressing; R shoulder flexion/abd combo 0-100   ? Time 12   ? Period Weeks   ? Status On-going   ? Target Date 09/08/21   ?  ? OT LONG TERM GOAL #3  ? Title Pt. will improve right Abduction by 10 degrees to assist caregivers with underarm hygiene care.   ? Baseline Eval: AROM abduction RUE 76. Pt. continues to present with limited bilateral abduction. 21st: AROM abduction RUE 80.06/16/2021: Right shulder abduction 108. 06/23/2021: Right shoulder abduction: 110; 08/16/21: abd 110   ? Time 12   ? Period Weeks   ? Status On-going   ? Target Date 09/08/21   ?  ? OT LONG TERM GOAL #4  ? Title Pt. will demonstrate visual compensatory strategies for navigating through his environment.   ? Baseline 30th visit: Pt. continues to require assist to navigate through his environment. 20th: Pt continues to require assist to navigate environment, minimal knowledge of strategies. Eval: Limited 06/23/2021: Pt. continues to be limited, and requires assist  with navigating through his environment; 08/16/21: father reports pt only steers his own wc in 1 open room of their house, otherwise dep to navigate environment.   ? Time 12   ? Period Weeks   ? Status On-going   ? Target Date 09/08/21   ?  ? OT LONG TERM GOAL #5  ? Title Pt. will demonstrate visual compensatory strategies for tabletop tasks within his near space.   ? Baseline 06/16/2021: Pt. continues to present with visual limitations for tabletop tasks in his near space. Pt. continues to  present with limited vision. 21st: Pt can identify objects on tabletop ~waterbottle sized, improves with contrasting colours, cannot ID small onjects; 08/16/21: no change from last assessment period   ? Time 12   ? Period Weeks   ? Status On-going   ? Target Date 09/08/21   ?  ? OT  LONG TERM GOAL #6  ? Title Pt. will perform hand to mouth patterns with supervision in preparation for self-feeding.   ? Baseline 06/23/2021: Pt. is able to use his bilateral hands to perfrom hand to mouth patterns for self-feeding with finger foods. Pt. is now able to use his left hand to bring a water bottle to his mouth,a nd drink from it. 30th visit: Pt. continues to present with weakness limiting functional hand to mouth patterns of movement. Pt. is able to perform hand to mouth patterns, however reports weakness in his hand  limits a secure grasp on the utensil. Pt. requires assist to perform; Eval: Pt. has difficulty; 08/16/21: pt continues to use L hand for self feeding finger foods with set up.  Spasticity limits dexterity to manipulate eating utensils.   ? Time 12   ? Period Weeks   ? Status On-going   ? Target Date 09/08/21   ?  ? OT LONG TERM GOAL #7  ? Title Pt. will perform one step self-grooming tasks with minA.   ? Baseline 06/23/2021: Pt. is able to perform the hand to face pattern with the left hand, however has difficulty actively sustaining his BUEs in elevation long enough to perform self-grooming tasks. 06/16/2021: Pt. is able to perform the hand to face pattern with the left hand, however has difficulty actively sustaining his BUEs in elevation long enough to perform self-grooming tasks; 08/16/21: same as 06/16/21   ? Time 12   ? Period Weeks   ? Status On-going   ? Target Date 09/08/21   ?  ? OT LONG TERM GOAL #8  ? Title Pt will complete UB dressing tasks with SETUP and assist to thread over hands only   ? Baseline 06/23/2021: Pt. requires MinA to thread arms, and requires maxA to donn over upper arms, MaxAx2 to donn over  his back. 06/16/2021: Pt. has progressed  to require MinA to thread arms, and requires maxA to donn over upper arms, MaxAx2 to donn over his back; 08/16/21: min A to thread arms, max A to don over upper

## 2021-08-17 NOTE — Therapy (Signed)
[facility] ?[place], [place], [address] ?Phone: [phone]   Fax:  [phone] ? ?Physical Therapy Treatment ?Physical Therapy Progress Note ? ? ?Dates of reporting period  06/23/21   to   08/16/21 ? ? ?Patient Details  ?Name: Paul Hall ?MRN: [id number] ?Date of Birth: May 19, 1995 ?Referring Provider (PT): Collene Mares, PA-C ? ? ?Encounter Date: 08/16/2021 ? ? PT End of Session - 08/17/21 1312   ? ? Visit Number 40   ? Number of Visits 61   ? Date for PT Re-Evaluation 09/13/21   ? Authorization Type [employer] and [employer]- Need auth past 12th visit   ? Authorization Time Period 01/04/21-03/29/21; Recert [id number]/76/2831   ? Progress Note Due on Visit 40   ? PT Start Time 1432   ? PT Stop Time 1515   ? PT Time Calculation (min) 43 min   ? Equipment Utilized During Treatment --   [employer] lift  ? Activity Tolerance Patient tolerated treatment well   queasiness/nausea  ? Behavior During Therapy [facility] for tasks assessed/performed   ? ?  ?  ? ?  ? ? ?Past Medical History:  ?Diagnosis Date  ? Diabetes mellitus ([place])   ? Hypertension   ? Stroke [facility])   ? ? ?Past Surgical History:  ?Procedure Laterality Date  ? IVC FILTER PLACEMENT ([facility] HX)    ? LEG SURGERY    ? PEG TUBE PLACEMENT    ? TRACHEOSTOMY    ? ? ?There were no vitals filed for this visit. ? ? Subjective Assessment - 08/17/21 1209   ? ? Subjective Patient reports increased discomfort in right side low back, "I think I pulled it this morning." He reports no pain at rest  but has discomfort with movement; He also reports the doctors are considering removing his filter as his risk of blood clots diminishes.   ? Patient is accompained by: Family member   ? Pertinent History Paul Hall is a 51yoM who presents with severe weakness, quadriparesis, altered sensorium, and visual impairment s/p critical illness and prolonged hospitalization. Pt hospitalized in October 2020 with ARDS  2/2 COVID19 infection. Pt sustained a complex and lengthy hospitalization which included tracheostomy, prolonged sedation, ECMO. In this period pt sustained CVA and SDH. Pt has now been liberated from tracheostomy and G-tube. Pt has since been hospitalized for wound infection and UTI. Pt lives with parents at home, has hospital bed and left chair, hoyer lift transfers, and power WC for mobility needs. Pt needs heavy physical assistance with ADL 2/2 BUE contractures and motor dysfunction.   ? Limitations Lifting;Standing;Walking;Writing;Reading;Sitting;House hold activities   ? How long can you sit comfortably? Patient reports uable to sit up long < 4 hours due to buttock discomfort   ? How long can you stand comfortably? Patient is currently unable to stand   ? How long can you walk comfortably? Patient is currently unable to walk   ? Diagnostic tests No recent relavent diagnostic tests   ? Patient Stated Goals I want to be able to possibly walk again   ? Currently in Pain? Yes   ? Pain Score --   unable to rate, no pain at rest, discomfort with movement;  ? Pain Location Back   ? Pain Orientation Right;Lower   ? Pain Descriptors / Indicators Aching;Tightness   ? Pain Type Acute pain   ? Pain Onset Today   ? Pain Frequency Intermittent   ? Aggravating  Factors  prolonged positioning/movement   ? Pain Relieving Factors rest/respositioning   ? Effect of Pain on Daily Activities decreased tolerance for prolonged positioning or certain movements;   ? Multiple Pain Sites No   ? ?  ?  ? ?  ? ? ? ? ? [facility] PT Assessment - 08/17/21 0001   ? ?  ? Strength  ? Right Hip Flexion 3-/5   ? Right Hip ABduction 3-/5   ? Right Hip ADduction 3/5   ? Left Hip Flexion 3+/5   ? Left Hip ABduction 3/5   ? Left Hip ADduction 3+/5   ? Right Knee Extension 3-/5   ? Left Knee Extension 4/5   ? ?  ?  ? ?  ? ? ? ? ? ? ?TREATMENT: ?Patient transferred wheelchair to mat table using hoyer lift ?He was positioned in hooklying position on mat table  with wedge and 2 pillows with good tolerance; ? ?PT performed ROM for flexibility: ?PT performed passive hamstring stretch with ankle DF 30 sec hold x1 rep each LE ?PT performed passive hamstring stretch without ankle DF 30 sec hold x1 rep each ?He reports moderate tightness in each LE being able to tolerate minimal ROM; ? ?Caregiver/patient report they don't stretch often at home. ? ?PT instructed patient in LE strengthening during MMT: ?SAQ with red bolster x5 reps each LE with cues for terminal knee extension ?Instructed patient in SLR hip flexion with cues for knee extension to assess hip flexor activation x5 reps each LE, required AAROM on RLE  ? ?Patient has sat edge of mat table in previous sessions but unable to tolerate this session due to increased back pain ? ?He was transferred mat table to wheelchair with hoyer lift ?He was dependent to reposition in wheelchair for comfort; ? ?Patient expressed he has a sit to stand lift at home. He reports he was using it when he was getting home health PT but has not used it since starting outpatient PT ? ?PT encouraged patient/caregiver in importance of utilizing sit to stand lift and stander for increased activity/weight bearing. Patient continues to have a goal of standing/walking and would benefit from increased weight bearing activities for flexibility and muscle activation; ?Pt/caregiver verbalized understanding;  ? ?Patient's condition has the potential to improve in response to therapy. Maximum improvement is yet to be obtained. The anticipated improvement is attainable and reasonable in a generally predictable time.  Patient reports doing some exercise but has been limited with intermittent back pain;  ? ? ? ? ? ? ? ? ? ? ? ? ? ? ? ? ? ? PT Education - 08/17/21 1312   ? ? Education Details positioning/progress towards goals;   ? Person(s) Educated Patient   ? Methods Explanation;Verbal cues   ? Comprehension Verbalized understanding;Returned  demonstration;Verbal cues required;Need further instruction   ? ?  ?  ? ?  ? ? ? PT Short Term Goals - 05/10/21 1413   ? ?  ? PT SHORT TERM GOAL #1  ? Title Pt will be independent with HEP in order to improve strength and balance in order to decrease fall risk and improve function at home and work.   ? Baseline 01/04/2021= No formal HEP in place; 12/12 no HEP in place; 05/10/2021-Patient and his father were able to report compliance with curent HEP consisting of mostly seated/reclined LE strengthening. Both verbalize no questions at this time.   ? Time 6   ? Period Weeks   ?  Status Achieved   ? Target Date 02/15/21   ? ?  ?  ? ?  ? ? ? ? PT Long Term Goals - 06/21/21 0848   ? ?  ? PT LONG TERM GOAL #1  ? Title Patient will increase BLE gross strength by 1/2 muscle grade to improve functional strength for improved independence with potential gait, increased standing tolerance and increased ADL ability.   ? Baseline 01/04/2021- Patient presents with 1/5 to 3-/5 B LE strength with MMT; 12/12: goal partially met for Left knee/hip; 05/10/2021= 2-/5 bilateal Hip flex; 3+/5 bilateral Knee ext; 06/21/2021= Patient presents with 2-/5 bilateral Hip flex; 3+/5 bilateral knee ext/flex; 2-/5 left ankle DF; 0/5 right ankle- and able to increase reps and resistance with LE's   ? Time 12   ? Period Weeks   ? Status Partially Met   ? Target Date 03/29/21   ?  ? PT LONG TERM GOAL #2  ? Title Patient will tolerate sitting unsupported demonstrating erect sitting posture for 15 minutes with CGA to demonstrate improved back extensor strength and improved sitting tolerance.   ? Baseline 01/04/2021- Patient confied to sitting in lift chair or electric power chair with back support and unable to sit upright without physical assistance; 12/12: tolerates <1 minutes upright unsupported sitting. 05/10/2021=static sit with forward trunk lean  in his power wheelchair without back support x approx 3 min. 06/21/2021=Unable to assess today due to patient with  acute back pain but on previous visit able to sit x 8 min without back support.   ? Time 12   ? Period Weeks   ? Status On-going   ? Target Date 09/13/21   ?  ? PT LONG TERM GOAL #3  ? Title Patient will demonstrate abi

## 2021-08-18 ENCOUNTER — Encounter: Payer: Self-pay | Admitting: Occupational Therapy

## 2021-08-18 ENCOUNTER — Ambulatory Visit: Payer: BC Managed Care – PPO | Admitting: Physical Therapy

## 2021-08-18 ENCOUNTER — Encounter: Payer: Self-pay | Admitting: Physical Therapy

## 2021-08-18 ENCOUNTER — Ambulatory Visit: Payer: BC Managed Care – PPO | Admitting: Occupational Therapy

## 2021-08-18 ENCOUNTER — Ambulatory Visit: Payer: BC Managed Care – PPO

## 2021-08-18 DIAGNOSIS — R269 Unspecified abnormalities of gait and mobility: Secondary | ICD-10-CM | POA: Diagnosis not present

## 2021-08-18 DIAGNOSIS — R2681 Unsteadiness on feet: Secondary | ICD-10-CM | POA: Diagnosis not present

## 2021-08-18 DIAGNOSIS — I6930 Unspecified sequelae of cerebral infarction: Secondary | ICD-10-CM | POA: Diagnosis not present

## 2021-08-18 DIAGNOSIS — M6281 Muscle weakness (generalized): Secondary | ICD-10-CM

## 2021-08-18 DIAGNOSIS — R262 Difficulty in walking, not elsewhere classified: Secondary | ICD-10-CM | POA: Diagnosis not present

## 2021-08-18 DIAGNOSIS — R278 Other lack of coordination: Secondary | ICD-10-CM | POA: Diagnosis not present

## 2021-08-18 DIAGNOSIS — R2689 Other abnormalities of gait and mobility: Secondary | ICD-10-CM | POA: Diagnosis not present

## 2021-08-18 NOTE — Therapy (Signed)
[place] ?[facility] ?[address] ?[place], [place], [address] ?Phone: [phone]   Fax:  [phone] ? ?Occupational Therapy Treatment ? ?Patient Details  ?Name: Paul Hall ?MRN: [id number] ?Date of Birth: [birth year] ?Referring Provider (OT): Lenise HeraldBenjamin Mann ? ? ?Encounter Date: 08/18/2021 ? ? OT End of Session - 08/18/21 1743   ? ? Visit Number 51   ? Number of Visits 72   ? Date for OT Re-Evaluation 09/08/21   ? Authorization Type Progress report period starting 06/28/21   ? OT Start Time 1515   ? OT Stop Time 1600   ? OT Time Calculation (min) 45 min   ? Activity Tolerance Patient tolerated treatment well   ? [facility] for tasks assessed/performed   ? ?  ?  ? ?  ? ? ?Past Medical History:  ?Diagnosis Date  ? Diabetes mellitus ([facility])   ? Hypertension   ? Stroke [facility])   ? ? ?Past Surgical History:  ?Procedure Laterality Date  ? IVC FILTER PLACEMENT ([facility] HX)    ? LEG SURGERY    ? PEG TUBE PLACEMENT    ? TRACHEOSTOMY    ? ? ?There were no vitals filed for this visit. ? ? Subjective Assessment - 08/18/21 1742   ? ? Subjective  Pt reports having back pain today   ? Patient is accompanied by: Family member   ? Pertinent History Pt. is a 26 y.o. male who was hospitalized, and diagnosed with COVID-19, sustained a CVA with resultant quadreplegia in 01/2019. Pt. was hospitalized with VRE  UTI. Pt. PMHx includes: urinary retention, Seizure Disorder, Obstructive Sleep Disorder, DM Type II, and Morbid obesity. Pt. resides at home with his parents.   ? Currently in Pain? Yes   ? Pain Score 4    ? Pain Location Back   ? Pain Orientation Right;Lower   ? Pain Descriptors / Indicators Aching;Tightness   ? ?  ?  ? ?  ? ?OT TREATMENT   ?  ?Manual Therapy: ?  ?Pt. tolerated soft tissue massage to the dorsal, and volar surface of the right 4th digit 2/2 pain, and stiffness. Manual therapy was performed independent of, and in preparation for ROM and therapeutic  Ex.  ?  ?Therapeutic Exercise: ?  ?Pt. tolerated AROM/AAROM/PROM to the bilateral shoulders, forearm supination, bilateral wrist flexion, extension, and MP, PIP, and DIP flexion, and extension without reports of pain this afternoon. Patient tolerated moist heat modality to his bilateral hands prior to range of motion.  Pt. Performed reps of bilateral shoulder flexion, abduction, horizontal abduction, and elbow flexion, extension with 2# cuff weights in place. ?  ?Pt. is scheduled [facility] Botox injections on Monday, [facility] Pt[facility]s father reports that the patient will be having his filter stents removed in early June. Pt. Reports 2-4/10 pain in his back today. Pt. tolerated manual therapy. Pt. continues to present with flexor tightness in the digits. Pt. continues to benefit from OT services  to improve ROM, and facilitate active volitional movement in order to work towards engaging his BUEs in daily ADLs, and IADL tasks.  ?  ? ? ? ? ? ? ? ? ? ? ? ? ? ? ? ? ? ? ? ? ? OT Education - 08/18/21 1743   ? ? Education Details progress towards OT goals   ? Person(s) Educated Patient;Parent(s)   ? Methods Verbal cues;Tactile cues;Explanation;Demonstration   ?  Comprehension Verbalized understanding;Verbal cues required;Need further instruction   ? ?  ?  ? ?  ? ? ? ? ? ? OT Long Term Goals - 08/16/21 1553   ? ?  ? OT LONG TERM GOAL #1  ? Title Pt. will improve FOTO score by 5 points for clinically significant improvements during ADLs, and IADLs,   ? Baseline 30th visit: FOTO: 37, 21st: FOTO 43. 10th Visit: FOTO score 42 Eval: FOTO score: 41 FOTO score: 39; 50th: FOTO 52   ? Time 12   ? Period Weeks   ? Status On-going   ? Target Date 09/08/21   ?  ? OT LONG TERM GOAL #2  ? Title Pt. will improve right shoulder flexion by 10 degrees to assist caregivers with UE dressing   ? Baseline Pt. continues to require assist with UE dressing secondary to limited ROM; 08/16/21: Max A-total A UB dressing; R  shoulder flexion/abd combo 0-100   ? Time 12   ? Period Weeks   ? Status On-going   ? Target Date 09/08/21   ?  ? OT LONG TERM GOAL #3  ? Title Pt. will improve right Abduction by 10 degrees to assist caregivers with underarm hygiene care.   ? Baseline Eval: AROM abduction RUE 76. Pt. continues to present with limited bilateral abduction. 21st: AROM abduction RUE 80.06/16/2021: Right shulder abduction 108. 06/23/2021: Right shoulder abduction: 110; 08/16/21: abd 110   ? Time 12   ? Period Weeks   ? Status On-going   ? Target Date 09/08/21   ?  ? OT LONG TERM GOAL #4  ? Title Pt. will demonstrate visual compensatory strategies for navigating through his environment.   ? Baseline 30th visit: Pt. continues to require assist to navigate through his environment. 20th: Pt continues to require assist to navigate environment, minimal knowledge of strategies. Eval: Limited 06/23/2021: Pt. continues to be limited, and requires assist  with navigating through his environment; 08/16/21: father reports pt only steers his own wc in 1 open room of their house, otherwise dep to navigate environment.   ? Time 12   ? Period Weeks   ? Status On-going   ? Target Date 09/08/21   ?  ? OT LONG TERM GOAL #5  ? Title Pt. will demonstrate visual compensatory strategies for tabletop tasks within his near space.   ? Baseline 06/16/2021: Pt. continues to present with visual limitations for tabletop tasks in his near space. Pt. continues to present with limited vision. 21st: Pt can identify objects on tabletop ~waterbottle sized, improves with contrasting colours, cannot ID small onjects; 08/16/21: no change from last assessment period   ? Time 12   ? Period Weeks   ? Status On-going   ? Target Date 09/08/21   ?  ? OT LONG TERM GOAL #6  ? Title Pt. will perform hand to mouth patterns with supervision in preparation for self-feeding.   ? Baseline 06/23/2021: Pt. is able to use his bilateral hands to perfrom hand to mouth patterns for self-feeding with  finger foods. Pt. is now able to use his left hand to bring a water bottle to his mouth,a nd drink from it. 30th visit: Pt. continues to present with weakness limiting functional hand to mouth patterns of movement. Pt. is able to perform hand to mouth patterns, however reports weakness in his hand  limits a secure grasp on the utensil. Pt. requires assist to perform; Eval: Pt. has difficulty; 08/16/21: pt continues to  use L hand for self feeding finger foods with set up.  Spasticity limits dexterity to manipulate eating utensils.   ? Time 12   ? Period Weeks   ? Status On-going   ? Target Date 09/08/21   ?  ? OT LONG TERM GOAL #7  ? Title Pt. will perform one step self-grooming tasks with minA.   ? Baseline 06/23/2021: Pt. is able to perform the hand to face pattern with the left hand, however has difficulty actively sustaining his BUEs in elevation long enough to perform self-grooming tasks. 06/16/2021: Pt. is able to perform the hand to face pattern with the left hand, however has difficulty actively sustaining his BUEs in elevation long enough to perform self-grooming tasks; 08/16/21: same as 06/16/21   ? Time 12   ? Period Weeks   ? Status On-going   ? Target Date 09/08/21   ?  ? OT LONG TERM GOAL #8  ? Title Pt will complete UB dressing tasks with SETUP and assist to thread over hands only   ? Baseline 06/23/2021: Pt. requires MinA to thread arms, and requires maxA to donn over upper arms, MaxAx2 to donn over his back. 06/16/2021: Pt. has progressed  to require MinA to thread arms, and requires maxA to donn over upper arms, MaxAx2 to donn over his back; 08/16/21: min A to thread arms, max A to don over upper arms and over head   ? Time 12   ? Period Weeks   ? Status On-going   ? Target Date 09/08/21   ?  ? OT LONG TERM GOAL  #9  ? TITLE Pt will improve B grip strength by 5# to assist in handling ADL items   ? Baseline 08/16/21: bilat girp 0#   ? Time 12   ? Period Weeks   ? Status On-going   ? Target Date 09/08/21   ?   ? OT LONG TERM GOAL  #10  ? TITLE Pt will improve B lateral pinch strength by 3# to assist in UBD.   ? Baseline N/A: awaiting pinch meter to be fixed. Pt. unable to formulate proper positioning for lateral pi

## 2021-08-19 ENCOUNTER — Ambulatory Visit: Payer: BC Managed Care – PPO | Admitting: Occupational Therapy

## 2021-08-19 ENCOUNTER — Ambulatory Visit: Payer: BC Managed Care – PPO

## 2021-08-19 NOTE — Therapy (Signed)
[place] MAIN [facility] SERVICES [address] [place], [place], [address] Phone: [phone]   Fax:  [phone]  Physical Therapy Treatment  Patient Details  Name: Paul [name] MRN: [id number] Date of Birth: 12-30-1995 Referring Provider (PT): Collene Mares, PA-C   Encounter Date: 08/18/2021   PT End of Session - 08/18/21 1424     Visit Number 41    Number of Visits 61    Date for PT Re-Evaluation 09/13/21    Authorization Type [employer] and [employer] Medicaid- Need auth past 12th visit    Authorization Time Period 01/04/21-03/29/21; Recert 29/47/6546-08/05/5463    Progress Note Due on Visit 64    PT Start Time 1432    PT Stop Time 1512    PT Time Calculation (min) 40 min    Equipment Utilized During Treatment --   Hoyer lift   Activity Tolerance Patient tolerated treatment well   queasiness/nausea   Behavior During Therapy WFL for tasks assessed/performed             Past Medical History:  Diagnosis Date   Diabetes mellitus ([place])    Hypertension    Stroke [facility])     Past Surgical History:  Procedure Laterality Date   IVC FILTER PLACEMENT ([facility] HX)     LEG SURGERY     PEG TUBE PLACEMENT     TRACHEOSTOMY      There were no vitals filed for this visit.   Subjective Assessment - 08/18/21 1428     Subjective Pt reports his right side low back pain is a little better. Its just an ache and is a little tight. Dad reports he took some advil about 1:30 prior to session;    Patient is accompained by: Family member    Pertinent History Paul [name] is a 30yoM who presents with severe weakness, quadriparesis, altered sensorium, and visual impairment s/p critical illness and prolonged hospitalization. Pt hospitalized in October 2020 with ARDS 2/2 COVID19 infection. Pt sustained a complex and lengthy hospitalization which included tracheostomy, prolonged sedation, ECMO. In this period pt sustained CVA and SDH. Pt has now been  liberated from tracheostomy and G-tube. Pt has since been hospitalized for wound infection and UTI. Pt lives with parents at home, has hospital bed and left chair, hoyer lift transfers, and power WC for mobility [name]. Pt [name] heavy physical assistance with ADL 2/2 BUE contractures and motor dysfunction.    Limitations Lifting;Standing;Walking;Writing;Reading;Sitting;House hold activities    How long can you sit comfortably? Patient reports uable to sit up long < 4 hours due to buttock discomfort    How long can you stand comfortably? Patient is currently unable to stand    How long can you walk comfortably? Patient is currently unable to walk    Diagnostic tests No recent relavent diagnostic tests    Patient Stated Goals I want to be able to possibly walk again    Currently in Pain? Yes    Pain Score 5     Pain Location Back    Pain Orientation Right;Lower    Pain Descriptors / Indicators Aching;Tightness    Pain Type Acute pain    Pain Onset Today              TREATMENT: Patient required transfer wheelchair to mat table using hoyer lift +2-3 for assist;  Patient placed on mat table in hooklying position with wedge and pillow +2  He transitioned to sitting edge of mat with +2-3 assist,  mod A with patient using arms to pull self up  Required stool under feet for proper positioning/support  PT assessed back paraspinals- identified tightness in right lats. He reports increased tenderness and soreness after palpation  Pt performed self scapular retraction, partial ROM 2x10 to help alleviate tightness/soreness PT provided patient with red tband (with dowel rods on hands for better grip), scapular retraction x10 reps;   Instructed patient to start using tband at home for strengthening  Instructed patient in LE strengthening: Seated hip flexion x10 reps each LE with decreased ROM on RLE, utilized mirror and verbal/visual cues to isolate hip flexion and avoid compensating with  LAQ Instructed patient in hip adduction/abduction AROM x5 reps with increased challenge with hip squeeze;  Pt often sits with legs abducted/ER due to weakness;  He required 1 supine rest break during sitting unsupported  He was able to sit edge of mat table for approximately 25 min total with therapist close supervision and intermittent assist;  He was transferred back to wheelchair using hoyer lift +2-3 for repositioning   Patient reports increased back pain at end of session following seated unsupported;                           PT Education - 08/18/21 1424     Education Details strengthening/positioning;    Person(s) Educated Patient    Methods Explanation;Verbal cues    Comprehension Verbalized understanding;Verbal cues required;Returned demonstration;Need further instruction              PT Short Term Goals - 05/10/21 1413       PT SHORT TERM GOAL #1   Title Pt will be independent with HEP in order to improve strength and balance in order to decrease fall risk and improve function at home and work.    Baseline 01/04/2021= No formal HEP in place; 12/12 no HEP in place; 05/10/2021-Patient and his father were able to report compliance with curent HEP consisting of mostly seated/reclined LE strengthening. Both verbalize no questions at this time.    Time 6    Period Weeks    Status Achieved    Target Date 02/15/21               PT Long Term Goals - 06/21/21 0848       PT LONG TERM GOAL #1   Title Patient will increase BLE gross strength by 1/2 muscle grade to improve functional strength for improved independence with potential gait, increased standing tolerance and increased ADL ability.    Baseline 01/04/2021- Patient presents with 1/5 to 3-/5 B LE strength with MMT; 12/12: goal partially met for Left knee/hip; 05/10/2021= 2-/5 bilateal Hip flex; 3+/5 bilateral Knee ext; 06/21/2021= Patient presents with 2-/5 bilateral Hip flex; 3+/5 bilateral knee  ext/flex; 2-/5 left ankle DF; 0/5 right ankle- and able to increase reps and resistance with LE's    Time 12    Period Weeks    Status Partially Met    Target Date 03/29/21      PT LONG TERM GOAL #2   Title Patient will tolerate sitting unsupported demonstrating erect sitting posture for 15 minutes with CGA to demonstrate improved back extensor strength and improved sitting tolerance.    Baseline 01/04/2021- Patient confied to sitting in lift chair or electric power chair with back support and unable to sit upright without physical assistance; 12/12: tolerates <1 minutes upright unsupported sitting. 05/10/2021=static sit with forward trunk lean  in his power wheelchair without back support x approx 3 min. 06/21/2021=Unable to assess today due to patient with acute back pain but on previous visit able to sit x 8 min without back support.    Time 12    Period Weeks    Status On-going    Target Date 09/13/21      PT LONG TERM GOAL #3   Title Patient will demonstrate ability to perform static standing in // bars > 2 min with Max Assist  without loss of balance and fair posture for improved overall strength for pre-gait and transfer activities.    Baseline 01/04/2021= Patient current uanble to stand- Dependent on hoyer or sit to stand lift for transfers. 05/10/2021=Not appropriate yet- Currently still dependent with all transfers using hoyer. 06/21/2021= Patient continuing now to focus on LE strengthening to prepare for standing-unable to try today due to acute low back pain-  planning on attempting in new cert period.    Time 12    Period Weeks    Status On-going    Target Date 09/13/21      PT LONG TERM GOAL #4   Title Pt will improve FOTO score by 10 points or more demonstrating improved perceived functional ability    Baseline FOTO 7 on 10/17; 03/15/21: FOTO 12; 05/10/2021 06/21/2021= 1    Time 12    Period Weeks    Status On-going    Target Date 03/29/21                   Plan -  08/19/21 0949     Clinical Impression Statement Patient motivated and participated fair within session. He does report increased back pain this session. PT palpated patient's paraspinals and identified increased tightness in right lats. Patient transferred sitting edge of mat table using hoyer lift. He was able to progress scapular retraction to using red tband for strengthening. Patient also instructed in LE strengthening. He requires increased cues/time for correct muscle activation. Patient requested to not try standing today due to back pain. Will continue to attempt using sit to stand lift for mobility. Patient would benefit from additional skilled PT intervention to improve strength, balance and mobility;    Personal Factors and Comorbidities Comorbidity 3+;Time since onset of injury/illness/exacerbation    Comorbidities CVA, diabetes, Seizures    Examination-Activity Limitations Bathing;Bed Mobility;Bend;Caring for Others;Carry;Dressing;Hygiene/Grooming;Lift;Locomotion Level;Reach Overhead;Self Feeding;Sit;Squat;Stairs;Stand;Transfers;Toileting    Examination-Participation Restrictions Cleaning;Community Activity;Driving;Laundry;Medication Management;Meal Prep;Occupation;Personal Finances;Shop;Yard Work;Volunteer    Stability/Clinical Decision Making Evolving/Moderate complexity    Rehab Potential Fair    PT Frequency 2x / week    PT Duration 12 weeks    PT Treatment/Interventions ADLs/Self Care Home Management;Cryotherapy;Electrical Stimulation;Moist Heat;Ultrasound;DME Instruction;Gait training;Stair training;Functional mobility training;Therapeutic exercise;Balance training;Patient/family education;Orthotic Fit/Training;Neuromuscular re-education;Wheelchair mobility training;Manual techniques;Passive range of motion;Dry needling;Energy conservation;Taping;Visual/perceptual remediation/compensation;Joint Manipulations    PT Next Visit Plan core strength- sitting at EOM, postural control, sabina  sit to stand if able; Continue with progressive LE Strengthening    PT Home Exercise Plan No changes to HEP today    Consulted and Agree with Plan of Care Patient;Family member/caregiver    Family Member Consulted Dad             Patient will benefit from skilled therapeutic intervention in order to improve the following deficits and impairments:  Abnormal gait, Decreased activity tolerance, Decreased balance, Decreased cognition, Cardiopulmonary status limiting activity, Decreased coordination, Decreased endurance, Decreased knowledge of use of DME, Decreased mobility, Decreased range of motion, Decreased safety awareness, Decreased strength,  Difficulty walking, Hypomobility, Impaired perceived functional ability, Impaired flexibility, Impaired UE functional use, Impaired vision/preception, Improper body mechanics, Postural dysfunction, Obesity  Visit Diagnosis: Muscle weakness (generalized)  Other lack of coordination  History of CVA with residual deficit  Difficulty in walking, not elsewhere classified  Unsteadiness on feet  Other abnormalities of gait and mobility  Abnormality of gait and mobility     Problem List Patient Active Problem List   Diagnosis Date Noted   Sepsis due to vancomycin resistant Enterococcus species ([place]) 06/06/2019   SIRS (systemic inflammatory response syndrome) ([facility]) 06/05/2019   Acute lower UTI 06/05/2019   VRE (vancomycin-resistant Enterococci) infection 06/05/2019   Anemia 06/05/2019   Skin ulcer of sacrum with necrosis of muscle ([place])    Urinary retention    Type 2 diabetes mellitus without complication, with long-term current use of insulin ([facility])    Tachycardia    Lower extremity edema    Acute metabolic encephalopathy    Obstructive sleep apnea    Morbid obesity with BMI of 60.0-69.9, adult ([place])    Goals of care, counseling/discussion    Palliative care encounter    Sepsis ([place]) 04/27/2019   H/O insulin dependent diabetes mellitus  04/27/2019   History of CVA with residual deficit 04/27/2019   Seizure disorder ([place]) 04/27/2019   Decubitus ulcer of sacral region, stage 4 ([place]) 04/27/2019    Paul [name], PT, DPT 08/19/2021, 10:07 AM  [facility] [address] [place], [place], [address] Phone: [phone]   Fax:  [phone]  Name: Paul [name] MRN: [id number] Date of Birth: Oct 19, 1995

## 2021-08-23 ENCOUNTER — Encounter: Payer: BC Managed Care – PPO | Admitting: Occupational Therapy

## 2021-08-23 ENCOUNTER — Ambulatory Visit: Payer: BC Managed Care – PPO

## 2021-08-23 ENCOUNTER — Ambulatory Visit: Payer: BC Managed Care – PPO | Admitting: Physical Therapy

## 2021-08-23 ENCOUNTER — Ambulatory Visit: Payer: BC Managed Care – PPO | Admitting: Occupational Therapy

## 2021-08-23 DIAGNOSIS — G8250 Quadriplegia, unspecified: Secondary | ICD-10-CM | POA: Diagnosis not present

## 2021-08-23 DIAGNOSIS — H4720 Unspecified optic atrophy: Secondary | ICD-10-CM | POA: Diagnosis not present

## 2021-08-23 DIAGNOSIS — Z8616 Personal history of COVID-19: Secondary | ICD-10-CM | POA: Diagnosis not present

## 2021-08-23 DIAGNOSIS — H547 Unspecified visual loss: Secondary | ICD-10-CM | POA: Diagnosis not present

## 2021-08-24 ENCOUNTER — Ambulatory Visit: Payer: BC Managed Care – PPO

## 2021-08-24 ENCOUNTER — Ambulatory Visit: Payer: BC Managed Care – PPO | Admitting: Occupational Therapy

## 2021-08-24 DIAGNOSIS — G4733 Obstructive sleep apnea (adult) (pediatric): Secondary | ICD-10-CM | POA: Diagnosis not present

## 2021-08-25 ENCOUNTER — Ambulatory Visit: Payer: BC Managed Care – PPO | Admitting: Occupational Therapy

## 2021-08-25 ENCOUNTER — Encounter: Payer: Self-pay | Admitting: Occupational Therapy

## 2021-08-25 ENCOUNTER — Ambulatory Visit: Payer: BC Managed Care – PPO

## 2021-08-25 DIAGNOSIS — I6930 Unspecified sequelae of cerebral infarction: Secondary | ICD-10-CM

## 2021-08-25 DIAGNOSIS — G4733 Obstructive sleep apnea (adult) (pediatric): Secondary | ICD-10-CM | POA: Diagnosis not present

## 2021-08-25 DIAGNOSIS — R278 Other lack of coordination: Secondary | ICD-10-CM | POA: Diagnosis not present

## 2021-08-25 DIAGNOSIS — R269 Unspecified abnormalities of gait and mobility: Secondary | ICD-10-CM | POA: Diagnosis not present

## 2021-08-25 DIAGNOSIS — R262 Difficulty in walking, not elsewhere classified: Secondary | ICD-10-CM | POA: Diagnosis not present

## 2021-08-25 DIAGNOSIS — R2681 Unsteadiness on feet: Secondary | ICD-10-CM | POA: Diagnosis not present

## 2021-08-25 DIAGNOSIS — I6389 Other cerebral infarction: Secondary | ICD-10-CM | POA: Diagnosis not present

## 2021-08-25 DIAGNOSIS — M6281 Muscle weakness (generalized): Secondary | ICD-10-CM

## 2021-08-25 DIAGNOSIS — R2689 Other abnormalities of gait and mobility: Secondary | ICD-10-CM | POA: Diagnosis not present

## 2021-08-25 DIAGNOSIS — L8994 Pressure ulcer of unspecified site, stage 4: Secondary | ICD-10-CM | POA: Diagnosis not present

## 2021-08-25 NOTE — Therapy (Unsigned)
[place] MAIN [facility] SERVICES [address] [place], [place], [address] Phone: [phone]   Fax:  [phone]  Physical Therapy Treatment  Patient Details  Name: Paul Hall MRN: [id number] Date of Birth: 1995/06/25 Referring Provider (PT): Collene Mares, PA-C   Encounter Date: 08/25/2021   PT End of Session - 08/25/21 1513     Visit Number 42    Number of Visits 61    Date for PT Re-Evaluation 09/13/21    Authorization Type [employer] and [employer] Medicaid- Need auth past 12th visit    Authorization Time Period 01/04/21-03/29/21; Recert 66/29/[id number]/65/0354    Progress Note Due on Visit 72    PT Start Time 1546    PT Stop Time 1630    PT Time Calculation (min) 44 min    Equipment Utilized During Treatment --   Hoyer lift   Activity Tolerance Patient tolerated treatment well   queasiness/nausea   Behavior During Therapy WFL for tasks assessed/performed             Past Medical History:  Diagnosis Date   Diabetes mellitus ([place])    Hypertension    Stroke [facility])     Past Surgical History:  Procedure Laterality Date   IVC FILTER PLACEMENT ([facility] HX)     LEG SURGERY     PEG TUBE PLACEMENT     TRACHEOSTOMY      There were no vitals filed for this visit.   Subjective Assessment - 08/25/21 1512     Subjective Patient reports just feeling uncomfortable in his chair today. Otherwise states doing okay.    Patient is accompained by: Family member    Pertinent History Paul Hall is a 73yoM who presents with severe weakness, quadriparesis, altered sensorium, and visual impairment s/p critical illness and prolonged hospitalization. Pt hospitalized in October 2020 with ARDS 2/2 COVID19 infection. Pt sustained a complex and lengthy hospitalization which included tracheostomy, prolonged sedation, ECMO. In this period pt sustained CVA and SDH. Pt has now been liberated from tracheostomy and G-tube. Pt has since been hospitalized for  wound infection and UTI. Pt lives with parents at home, has hospital bed and left chair, hoyer lift transfers, and power WC for mobility needs. Pt needs heavy physical assistance with ADL 2/2 BUE contractures and motor dysfunction.    Limitations Lifting;Standing;Walking;Writing;Reading;Sitting;House hold activities    How long can you sit comfortably? Patient reports uable to sit up long < 4 hours due to buttock discomfort    How long can you stand comfortably? Patient is currently unable to stand    How long can you walk comfortably? Patient is currently unable to walk    Diagnostic tests No recent relavent diagnostic tests    Patient Stated Goals I want to be able to possibly walk again    Currently in Pain? No/denies    Pain Onset --             INTERVENTIONS:   *Spent time discussing pamphlet involving adaptive equipment and local parks available for people with disabilities.   Manual therapy:  Hamstring- hold 30 sec x 4 sets BLE Right knee flex stretch x 30 sec x 4 Calf stretch x 30 sec x 4 Hip adduction/abd x 30 sec x 4 each direction.  HIP IR (Due to right LE - resting in more external position) x 30 sec x 4.   Therex:   Seated hip flex B LE x 15 reps (VC to achieve max height with  each LE)  Seated hip abd (manual resistance) x 3 sec x 15 reps Seated hip add with ball squeeze- Difficulty with right LE - increased VC to squeeze more with right LE.  Seated knee ext BLE x 15 reps                             PT Education - 08/26/21 1146     Education Details Exercise technique and positioning.    Person(s) Educated Patient    Methods Explanation;Demonstration;Tactile cues;Verbal cues    Comprehension Verbalized understanding;Returned demonstration;Verbal cues required;Tactile cues required;Need further instruction              PT Short Term Goals - 05/10/21 1413       PT SHORT TERM GOAL #1   Title Pt will be independent with HEP in  order to improve strength and balance in order to decrease fall risk and improve function at home and work.    Baseline 01/04/2021= No formal HEP in place; 12/12 no HEP in place; 05/10/2021-Patient and his father were able to report compliance with curent HEP consisting of mostly seated/reclined LE strengthening. Both verbalize no questions at this time.    Time 6    Period Weeks    Status Achieved    Target Date 02/15/21               PT Long Term Goals - 06/21/21 0848       PT LONG TERM GOAL #1   Title Patient will increase BLE gross strength by 1/2 muscle grade to improve functional strength for improved independence with potential gait, increased standing tolerance and increased ADL ability.    Baseline 01/04/2021- Patient presents with 1/5 to 3-/5 B LE strength with MMT; 12/12: goal partially met for Left knee/hip; 05/10/2021= 2-/5 bilateal Hip flex; 3+/5 bilateral Knee ext; 06/21/2021= Patient presents with 2-/5 bilateral Hip flex; 3+/5 bilateral knee ext/flex; 2-/5 left ankle DF; 0/5 right ankle- and able to increase reps and resistance with LE's    Time 12    Period Weeks    Status Partially Met    Target Date 03/29/21      PT LONG TERM GOAL #2   Title Patient will tolerate sitting unsupported demonstrating erect sitting posture for 15 minutes with CGA to demonstrate improved back extensor strength and improved sitting tolerance.    Baseline 01/04/2021- Patient confied to sitting in lift chair or electric power chair with back support and unable to sit upright without physical assistance; 12/12: tolerates <1 minutes upright unsupported sitting. 05/10/2021=static sit with forward trunk lean  in his power wheelchair without back support x approx 3 min. 06/21/2021=Unable to assess today due to patient with acute back pain but on previous visit able to sit x 8 min without back support.    Time 12    Period Weeks    Status On-going    Target Date 09/13/21      PT LONG TERM GOAL #3   Title  Patient will demonstrate ability to perform static standing in // bars > 2 min with Max Assist  without loss of balance and fair posture for improved overall strength for pre-gait and transfer activities.    Baseline 01/04/2021= Patient current uanble to stand- Dependent on hoyer or sit to stand lift for transfers. 05/10/2021=Not appropriate yet- Currently still dependent with all transfers using hoyer. 06/21/2021= Patient continuing now to focus on LE strengthening to prepare  for standing-unable to try today due to acute low back pain-  planning on attempting in new cert period.    Time 12    Period Weeks    Status On-going    Target Date 09/13/21      PT LONG TERM GOAL #4   Title Pt will improve FOTO score by 10 points or more demonstrating improved perceived functional ability    Baseline FOTO 7 on 10/17; 03/15/21: FOTO 12; 05/10/2021 06/21/2021= 1    Time 12    Period Weeks    Status On-going    Target Date 03/29/21                   Plan - 08/25/21 1513     Clinical Impression Statement Patient presented with good motivation. Treatment focused on discussing adaptive park in local area that the patient could potentially visit for improved quality of life and increased activity out of home. Treatment then focused on manual stretching of LE due to increased tightness overall. He then perform several LE strengthening. Will attempt to try to stand next visit if appropriate. Patient would benefit from additional skilled PT intervention to improve strength, balance and mobility.    Personal Factors and Comorbidities Comorbidity 3+;Time since onset of injury/illness/exacerbation    Comorbidities CVA, diabetes, Seizures    Examination-Activity Limitations Bathing;Bed Mobility;Bend;Caring for Others;Carry;Dressing;Hygiene/Grooming;Lift;Locomotion Level;Reach Overhead;Self Feeding;Sit;Squat;Stairs;Stand;Transfers;Toileting    Examination-Participation Restrictions Cleaning;Community  Activity;Driving;Laundry;Medication Management;Meal Prep;Occupation;Personal Finances;Shop;Yard Work;Volunteer    Stability/Clinical Decision Making Evolving/Moderate complexity    Rehab Potential Fair    PT Frequency 2x / week    PT Duration 12 weeks    PT Treatment/Interventions ADLs/Self Care Home Management;Cryotherapy;Electrical Stimulation;Moist Heat;Ultrasound;DME Instruction;Gait training;Stair training;Functional mobility training;Therapeutic exercise;Balance training;Patient/family education;Orthotic Fit/Training;Neuromuscular re-education;Wheelchair mobility training;Manual techniques;Passive range of motion;Dry needling;Energy conservation;Taping;Visual/perceptual remediation/compensation;Joint Manipulations    PT Next Visit Plan core strength- sitting at EOM, postural control, sabina sit to stand if able; Continue with progressive LE Strengthening    PT Home Exercise Plan No changes to HEP today    Consulted and Agree with Plan of Care Patient;Family member/caregiver    Family Member Consulted Dad             Patient will benefit from skilled therapeutic intervention in order to improve the following deficits and impairments:  Abnormal gait, Decreased activity tolerance, Decreased balance, Decreased cognition, Cardiopulmonary status limiting activity, Decreased coordination, Decreased endurance, Decreased knowledge of use of DME, Decreased mobility, Decreased range of motion, Decreased safety awareness, Decreased strength, Difficulty walking, Hypomobility, Impaired perceived functional ability, Impaired flexibility, Impaired UE functional use, Impaired vision/preception, Improper body mechanics, Postural dysfunction, Obesity  Visit Diagnosis: Muscle weakness (generalized)  Other lack of coordination  History of CVA with residual deficit  Difficulty in walking, not elsewhere classified  Unsteadiness on feet  Other abnormalities of gait and mobility  Abnormality of gait and  mobility     Problem List Patient Active Problem List   Diagnosis Date Noted   Sepsis due to vancomycin resistant Enterococcus species ([facility]) 06/06/2019   SIRS (systemic inflammatory response syndrome) ([place]) 06/05/2019   Acute lower UTI 06/05/2019   VRE (vancomycin-resistant Enterococci) infection 06/05/2019   Anemia 06/05/2019   Skin ulcer of sacrum with necrosis of muscle ([facility])    Urinary retention    Type 2 diabetes mellitus without complication, with long-term current use of insulin ([facility])    Tachycardia    Lower extremity edema    Acute metabolic encephalopathy    Obstructive sleep apnea  Morbid obesity with BMI of 60.0-69.9, adult ([place])    Goals of care, counseling/discussion    Palliative care encounter    Sepsis ([place]) 04/27/2019   H/O insulin dependent diabetes mellitus 04/27/2019   History of CVA with residual deficit 04/27/2019   Seizure disorder ([place]) 04/27/2019   Decubitus ulcer of sacral region, stage 4 ([place]) 04/27/2019    Lewis Moccasin, PT 08/26/2021, 12:00 PM  [place] MAIN [facility] SERVICES [address] [place], [place], [address] Phone: [phone]   Fax:  [phone]  Name: Paul Hall MRN: [id number] Date of Birth: 09-Nov-1995

## 2021-08-25 NOTE — Therapy (Signed)
[facility] MAIN [facility] SERVICES [address] [place], [place], [address] Phone: [phone]   Fax:  [phone]  Occupational Therapy Treatment  Patient Details  Name: Paul Hall MRN: [id number] Date of Birth: [birth year] Referring Provider (OT): Lenise Herald   Encounter Date: 08/25/2021   OT End of Session - 08/25/21 1650     Visit Number 52    Number of Visits 72    Date for OT Re-Evaluation 09/08/21    Authorization Type Progress report period starting 06/28/21    OT Start Time 1515    OT Stop Time 1600    OT Time Calculation (min) 45 min    Activity Tolerance Patient tolerated treatment well    Behavior During Therapy [facility] for tasks assessed/performed             Past Medical History:  Diagnosis Date   Diabetes mellitus (HCC)    Hypertension    Stroke [facility])     Past Surgical History:  Procedure Laterality Date   IVC FILTER PLACEMENT ([facility] HX)     LEG SURGERY     PEG TUBE PLACEMENT     TRACHEOSTOMY      There were no vitals filed for this visit.   Subjective Assessment - 08/25/21 1649     Subjective  Pt. reports having received Botox last week.    Patient is accompanied by: Family member    Pertinent History Pt. is a 26 y.o. male who was hospitalized, and diagnosed with COVID-19, sustained a CVA with resultant quadreplegia in 01/2019. Pt. was hospitalized with VRE  UTI. Pt. PMHx includes: urinary retention, Seizure Disorder, Obstructive Sleep Disorder, DM Type II, and Morbid obesity. Pt. resides at home with his parents.    Currently in Pain? No/denies            OT TREATMENT     Therapeutic Exercise:   Pt. tolerated AROM/AAROM/PROM to the bilateral shoulders, bilateral wrist flexion, extension, and MP, PIP, and DIP flexion, and extension without reports of pain this afternoon. Patient tolerated moist heat modality to his bilateral hands prior to range of motion. Emphasis was placed on slow prolonged  gentle stretching of right elbow extension, wrist extension, bilateral hand digit extension.   Pt. received Botox injections this past Monday. Pt. tolerated ROM with no reports of pain. Pt. presents with flexor tightness in the digits. Pt. continues to benefit from OT services  to improve ROM, and facilitate active volitional movement in order to work towards engaging his BUEs in daily ADLs, and IADL tasks. Will plan to re-attempt neuromuscular EStim at next visit.                               OT Education - 08/25/21 1650     Education Details progress towards OT goals    Person(s) Educated Patient;Parent(s)    Methods Verbal cues;Tactile cues;Explanation;Demonstration    Comprehension Verbalized understanding;Verbal cues required;Need further instruction                 OT Long Term Goals - 08/16/21 1553       OT LONG TERM GOAL #1   Title Pt. will improve FOTO score by 5 points for clinically significant improvements during ADLs, and IADLs,    Baseline 30th visit: FOTO: 37, 21st: FOTO 43. 10th Visit: FOTO score 42 Eval: FOTO score: 41 FOTO score: 39; 50th: FOTO 52  Time 12    Period Weeks    Status On-going    Target Date 09/08/21      OT LONG TERM GOAL #2   Title Pt. will improve right shoulder flexion by 10 degrees to assist caregivers with UE dressing    Baseline Pt. continues to require assist with UE dressing secondary to limited ROM; 08/16/21: Max A-total A UB dressing; R shoulder flexion/abd combo 0-100    Time 12    Period Weeks    Status On-going    Target Date 09/08/21      OT LONG TERM GOAL #3   Title Pt. will improve right Abduction by 10 degrees to assist caregivers with underarm hygiene care.    Baseline Eval: AROM abduction RUE 76. Pt. continues to present with limited bilateral abduction. 21st: AROM abduction RUE 80.06/16/2021: Right shulder abduction 108. 06/23/2021: Right shoulder abduction: 110; 08/16/21: abd 110    Time 12     Period Weeks    Status On-going    Target Date 09/08/21      OT LONG TERM GOAL #4   Title Pt. will demonstrate visual compensatory strategies for navigating through his environment.    Baseline 30th visit: Pt. continues to require assist to navigate through his environment. 20th: Pt continues to require assist to navigate environment, minimal knowledge of strategies. Eval: Limited 06/23/2021: Pt. continues to be limited, and requires assist  with navigating through his environment; 08/16/21: father reports pt only steers his own wc in 1 open room of their house, otherwise dep to navigate environment.    Time 12    Period Weeks    Status On-going    Target Date 09/08/21      OT LONG TERM GOAL #5   Title Pt. will demonstrate visual compensatory strategies for tabletop tasks within his near space.    Baseline 06/16/2021: Pt. continues to present with visual limitations for tabletop tasks in his near space. Pt. continues to present with limited vision. 21st: Pt can identify objects on tabletop ~waterbottle sized, improves with contrasting colours, cannot ID small onjects; 08/16/21: no change from last assessment period    Time 12    Period Weeks    Status On-going    Target Date 09/08/21      OT LONG TERM GOAL #6   Title Pt. will perform hand to mouth patterns with supervision in preparation for self-feeding.    Baseline 06/23/2021: Pt. is able to use his bilateral hands to perfrom hand to mouth patterns for self-feeding with finger foods. Pt. is now able to use his left hand to bring a water bottle to his mouth,a nd drink from it. 30th visit: Pt. continues to present with weakness limiting functional hand to mouth patterns of movement. Pt. is able to perform hand to mouth patterns, however reports weakness in his hand  limits a secure grasp on the utensil. Pt. requires assist to perform; Eval: Pt. has difficulty; 08/16/21: pt continues to use L hand for self feeding finger foods with set up.  Spasticity  limits dexterity to manipulate eating utensils.    Time 12    Period Weeks    Status On-going    Target Date 09/08/21      OT LONG TERM GOAL #7   Title Pt. will perform one step self-grooming tasks with minA.    Baseline 06/23/2021: Pt. is able to perform the hand to face pattern with the left hand, however has difficulty actively sustaining his BUEs in  elevation long enough to perform self-grooming tasks. 06/16/2021: Pt. is able to perform the hand to face pattern with the left hand, however has difficulty actively sustaining his BUEs in elevation long enough to perform self-grooming tasks; 08/16/21: same as 06/16/21    Time 12    Period Weeks    Status On-going    Target Date 09/08/21      OT LONG TERM GOAL #8   Title Pt will complete UB dressing tasks with SETUP and assist to thread over hands only    Baseline 06/23/2021: Pt. requires MinA to thread arms, and requires maxA to donn over upper arms, MaxAx2 to donn over his back. 06/16/2021: Pt. has progressed  to require MinA to thread arms, and requires maxA to donn over upper arms, MaxAx2 to donn over his back; 08/16/21: min A to thread arms, max A to don over upper arms and over head    Time 12    Period Weeks    Status On-going    Target Date 09/08/21      OT LONG TERM GOAL  #9   TITLE Pt will improve B grip strength by 5# to assist in handling ADL items    Baseline 08/16/21: bilat girp 0#    Time 12    Period Weeks    Status On-going    Target Date 09/08/21      OT LONG TERM GOAL  #10   TITLE Pt will improve B lateral pinch strength by 3# to assist in UBD.    Baseline N/A: awaiting pinch meter to be fixed. Pt. unable to formulate proper positioning for lateral pinch assessment of the replacement pinch meter; 08/16/21: R 2#, L 1.5#    Time 12    Period Weeks    Status On-going    Target Date 09/08/21                   Plan - 08/25/21 1652     Clinical Impression Statement Pt. received Botox injections this past Monday.  Pt. tolerated ROM with no reports of pain. Pt. presents with flexor tightness in the digits. Pt. continues to benefit from OT services  to improve ROM, and facilitate active volitional movement in order to work towards engaging his BUEs in daily ADLs, and IADL tasks. Will plan to re-attempt neuromuscular EStim at next visit.       OT Occupational Profile and History Detailed Assessment- Review of Records and additional review of physical, cognitive, psychosocial history related to current functional performance    Occupational performance deficits (Please refer to evaluation for details): ADL's;IADL's    Body Structure / Function / Physical Skills ADL;FMC;ROM;Dexterity;IADL;Endurance;Obesity;Strength;UE functional use    Rehab Potential Good    Clinical Decision Making Multiple treatment options, significant modification of task necessary    Comorbidities Affecting Occupational Performance: Presence of comorbidities impacting occupational performance    Modification or Assistance to Complete Evaluation  Max significant modification of tasks or assist is necessary to complete    OT Frequency 2x / week    OT Duration 12 weeks    OT Treatment/Interventions Self-care/ADL training;Therapeutic exercise;Neuromuscular education;Visual/perceptual remediation/compensation;Patient/family education;Therapeutic activities;DME and/or AE instruction;Passive range of motion;Moist Heat;Electrical Stimulation    Consulted and Agree with Plan of Care Patient             Patient will benefit from skilled therapeutic intervention in order to improve the following deficits and impairments:   Body Structure / Function / Physical Skills: ADL, FMC, ROM,  Dexterity, IADL, Endurance, Obesity, Strength, UE functional use       Visit Diagnosis: Muscle weakness (generalized)  Other lack of coordination    Problem List Patient Active Problem List   Diagnosis Date Noted   Sepsis due to vancomycin resistant  Enterococcus species (HCC) 06/06/2019   SIRS (systemic inflammatory response syndrome) (HCC) 06/05/2019   Acute lower UTI 06/05/2019   VRE (vancomycin-resistant Enterococci) infection 06/05/2019   Anemia 06/05/2019   Skin ulcer of sacrum with necrosis of muscle (HCC)    Urinary retention    Type 2 diabetes mellitus without complication, with long-term current use of insulin (HCC)    Tachycardia    Lower extremity edema    Acute metabolic encephalopathy    Obstructive sleep apnea    Morbid obesity with BMI of 60.0-69.9, adult (HCC)    Goals of care, counseling/discussion    Palliative care encounter    Sepsis (HCC) 04/27/2019   H/O insulin dependent diabetes mellitus 04/27/2019   History of CVA with residual deficit 04/27/2019   Seizure disorder (HCC) 04/27/2019   Decubitus ulcer of sacral region, stage 4 (HCC) 04/27/2019   Olegario MessierElaine Ruby Logiudice, MS, OTR/L  Olegario MessierElaine Kea Callan, OT 08/25/2021, 4:57 PM  [facility] MAIN [facility] [address] [place], [place], [address] Phone: [phone]   Fax:  [phone]  Name: Paul Hall MRN: [id number] Date of Birth: Jul 29, 1995

## 2021-08-26 ENCOUNTER — Ambulatory Visit: Payer: BC Managed Care – PPO | Admitting: Occupational Therapy

## 2021-08-26 ENCOUNTER — Ambulatory Visit: Payer: BC Managed Care – PPO

## 2021-08-31 ENCOUNTER — Ambulatory Visit: Payer: BC Managed Care – PPO | Admitting: Occupational Therapy

## 2021-08-31 ENCOUNTER — Ambulatory Visit: Payer: BC Managed Care – PPO

## 2021-09-01 ENCOUNTER — Ambulatory Visit: Payer: BC Managed Care – PPO

## 2021-09-01 ENCOUNTER — Ambulatory Visit: Payer: BC Managed Care – PPO | Admitting: Physical Therapy

## 2021-09-01 ENCOUNTER — Ambulatory Visit: Payer: BC Managed Care – PPO | Admitting: Occupational Therapy

## 2021-09-02 ENCOUNTER — Ambulatory Visit: Payer: BC Managed Care – PPO | Admitting: Physical Therapy

## 2021-09-02 ENCOUNTER — Encounter: Payer: BC Managed Care – PPO | Admitting: Occupational Therapy

## 2021-09-06 ENCOUNTER — Ambulatory Visit: Payer: BC Managed Care – PPO

## 2021-09-06 ENCOUNTER — Ambulatory Visit: Payer: BC Managed Care – PPO | Attending: Physician Assistant

## 2021-09-06 DIAGNOSIS — R269 Unspecified abnormalities of gait and mobility: Secondary | ICD-10-CM

## 2021-09-06 DIAGNOSIS — R2681 Unsteadiness on feet: Secondary | ICD-10-CM

## 2021-09-06 DIAGNOSIS — M6281 Muscle weakness (generalized): Secondary | ICD-10-CM | POA: Diagnosis not present

## 2021-09-06 DIAGNOSIS — R278 Other lack of coordination: Secondary | ICD-10-CM

## 2021-09-06 DIAGNOSIS — R262 Difficulty in walking, not elsewhere classified: Secondary | ICD-10-CM | POA: Insufficient documentation

## 2021-09-06 DIAGNOSIS — R2689 Other abnormalities of gait and mobility: Secondary | ICD-10-CM | POA: Diagnosis not present

## 2021-09-06 DIAGNOSIS — I6930 Unspecified sequelae of cerebral infarction: Secondary | ICD-10-CM | POA: Diagnosis not present

## 2021-09-06 NOTE — Therapy (Signed)
[place] MAIN [facility] SERVICES [address] [place], [place], [address] Phone: [phone]   Fax:  [phone]  Physical Therapy Treatment  Patient Details  Name: Paul Hall MRN: [id number] Date of Birth: 1996-04-01 Referring Provider (PT): Collene Mares, PA-C   Encounter Date: 09/06/2021   PT End of Session - 09/06/21 1649     Visit Number 43    Number of Visits 61    Date for PT Re-Evaluation 09/13/21    Authorization Type [employer] and [employer]- Need auth past 12th visit    Authorization Time Period 01/04/21-03/29/21; Recert 07/62/2633-3/54/5625    Progress Note Due on Visit 26    PT Start Time 1540    PT Stop Time 1601    PT Time Calculation (min) 21 min    Equipment Utilized During Treatment Gait belt   Hoyer lift   Activity Tolerance Patient tolerated treatment well   queasiness/nausea   Behavior During Therapy WFL for tasks assessed/performed             Past Medical History:  Diagnosis Date   Diabetes mellitus ([place])    Hypertension    Stroke [facility])     Past Surgical History:  Procedure Laterality Date   IVC FILTER PLACEMENT ([facility] HX)     LEG SURGERY     PEG TUBE PLACEMENT     TRACHEOSTOMY      There were no vitals filed for this visit.   Subjective Assessment - 09/06/21 1645     Subjective Patient reports no new issues today and agreeable to stand    Patient is accompained by: Family member    Pertinent History Paul Hall is a 59yoM who presents with severe weakness, quadriparesis, altered sensorium, and visual impairment s/p critical illness and prolonged hospitalization. Pt hospitalized in October 2020 with ARDS 2/2 COVID19 infection. Pt sustained a complex and lengthy hospitalization which included tracheostomy, prolonged sedation, ECMO. In this period pt sustained CVA and SDH. Pt has now been liberated from tracheostomy and G-tube. Pt has since been hospitalized for wound infection and UTI.  Pt lives with parents at home, has hospital bed and left chair, hoyer lift transfers, and power WC for mobility needs. Pt needs heavy physical assistance with ADL 2/2 BUE contractures and motor dysfunction.    Limitations Lifting;Standing;Walking;Writing;Reading;Sitting;House hold activities    How long can you sit comfortably? Patient reports uable to sit up long < 4 hours due to buttock discomfort    How long can you stand comfortably? Patient is currently unable to stand    How long can you walk comfortably? Patient is currently unable to walk    Diagnostic tests No recent relavent diagnostic tests    Patient Stated Goals I want to be able to possibly walk again    Currently in Pain? No/denies             INTERVENTIONS:    *Patient was 25 min late due to transportation issues- Lucianne Lei was late picking him up.    Standing from power w/c in front of mat table (elevated to max height) - Assisted by 2 PT's (one on each side) and w/c controls assisted by Patient's father. Increased time spent working on set up for max safety. Once ready- Attempted to stand - Max to almost dependent stand with 2 PT's and patient unable to achieve standing x multiple attempts (4) today. He required max assist to weight shift out to edge of seat to place feet  on floor. He was unsuccessful with extending hips for any purposeful standing today.                              PT Education - 09/06/21 1649     Education Details standing technique    Person(s) Educated Patient    Methods Explanation;Demonstration;Tactile cues;Verbal cues    Comprehension Returned demonstration;Verbal cues required;Need further instruction;Tactile cues required;Verbalized understanding              PT Short Term Goals - 05/10/21 1413       PT SHORT TERM GOAL #1   Title Pt will be independent with HEP in order to improve strength and balance in order to decrease fall risk and improve function at home  and work.    Baseline 01/04/2021= No formal HEP in place; 12/12 no HEP in place; 05/10/2021-Patient and his father were able to report compliance with curent HEP consisting of mostly seated/reclined LE strengthening. Both verbalize no questions at this time.    Time 6    Period Weeks    Status Achieved    Target Date 02/15/21               PT Long Term Goals - 06/21/21 0848       PT LONG TERM GOAL #1   Title Patient will increase BLE gross strength by 1/2 muscle grade to improve functional strength for improved independence with potential gait, increased standing tolerance and increased ADL ability.    Baseline 01/04/2021- Patient presents with 1/5 to 3-/5 B LE strength with MMT; 12/12: goal partially met for Left knee/hip; 05/10/2021= 2-/5 bilateal Hip flex; 3+/5 bilateral Knee ext; 06/21/2021= Patient presents with 2-/5 bilateral Hip flex; 3+/5 bilateral knee ext/flex; 2-/5 left ankle DF; 0/5 right ankle- and able to increase reps and resistance with LE's    Time 12    Period Weeks    Status Partially Met    Target Date 03/29/21      PT LONG TERM GOAL #2   Title Patient will tolerate sitting unsupported demonstrating erect sitting posture for 15 minutes with CGA to demonstrate improved back extensor strength and improved sitting tolerance.    Baseline 01/04/2021- Patient confied to sitting in lift chair or electric power chair with back support and unable to sit upright without physical assistance; 12/12: tolerates <1 minutes upright unsupported sitting. 05/10/2021=static sit with forward trunk lean  in his power wheelchair without back support x approx 3 min. 06/21/2021=Unable to assess today due to patient with acute back pain but on previous visit able to sit x 8 min without back support.    Time 12    Period Weeks    Status On-going    Target Date 09/13/21      PT LONG TERM GOAL #3   Title Patient will demonstrate ability to perform static standing in // bars > 2 min with Max Assist   without loss of balance and fair posture for improved overall strength for pre-gait and transfer activities.    Baseline 01/04/2021= Patient current uanble to stand- Dependent on hoyer or sit to stand lift for transfers. 05/10/2021=Not appropriate yet- Currently still dependent with all transfers using hoyer. 06/21/2021= Patient continuing now to focus on LE strengthening to prepare for standing-unable to try today due to acute low back pain-  planning on attempting in new cert period.    Time 12    Period Weeks  Status On-going    Target Date 09/13/21      PT LONG TERM GOAL #4   Title Pt will improve FOTO score by 10 points or more demonstrating improved perceived functional ability    Baseline FOTO 7 on 10/17; 03/15/21: FOTO 12; 05/10/2021 06/21/2021= 1    Time 12    Period Weeks    Status On-going    Target Date 03/29/21                   Plan - 09/06/21 1650     Clinical Impression Statement Treatment was limited today due to patient arriving late (Transportation issue). He did attempt standing today with assist of 2 patient but almost dependent today -unable to extend hips or knees well today. He was motivated but just too weak to stand. He may benefit from another trial of standing with sit to stand lift. Patient would benefit from additional skilled PT intervention to improve strength, balance and mobility.    Personal Factors and Comorbidities Comorbidity 3+;Time since onset of injury/illness/exacerbation    Comorbidities CVA, diabetes, Seizures    Examination-Activity Limitations Bathing;Bed Mobility;Bend;Caring for Others;Carry;Dressing;Hygiene/Grooming;Lift;Locomotion Level;Reach Overhead;Self Feeding;Sit;Squat;Stairs;Stand;Transfers;Toileting    Examination-Participation Restrictions Cleaning;Community Activity;Driving;Laundry;Medication Management;Meal Prep;Occupation;Personal Finances;Shop;Yard Work;Volunteer    Stability/Clinical Decision Making Evolving/Moderate complexity     Rehab Potential Fair    PT Frequency 2x / week    PT Duration 12 weeks    PT Treatment/Interventions ADLs/Self Care Home Management;Cryotherapy;Electrical Stimulation;Moist Heat;Ultrasound;DME Instruction;Gait training;Stair training;Functional mobility training;Therapeutic exercise;Balance training;Patient/family education;Orthotic Fit/Training;Neuromuscular re-education;Wheelchair mobility training;Manual techniques;Passive range of motion;Dry needling;Energy conservation;Taping;Visual/perceptual remediation/compensation;Joint Manipulations    PT Next Visit Plan core strength- sitting at EOM, postural control, sabina sit to stand if able; Continue with progressive LE Strengthening    PT Home Exercise Plan No changes to HEP today    Consulted and Agree with Plan of Care Patient;Family member/caregiver    Family Member Consulted Dad             Patient will benefit from skilled therapeutic intervention in order to improve the following deficits and impairments:  Abnormal gait, Decreased activity tolerance, Decreased balance, Decreased cognition, Cardiopulmonary status limiting activity, Decreased coordination, Decreased endurance, Decreased knowledge of use of DME, Decreased mobility, Decreased range of motion, Decreased safety awareness, Decreased strength, Difficulty walking, Hypomobility, Impaired perceived functional ability, Impaired flexibility, Impaired UE functional use, Impaired vision/preception, Improper body mechanics, Postural dysfunction, Obesity  Visit Diagnosis: Muscle weakness (generalized)  Other lack of coordination  History of CVA with residual deficit  Difficulty in walking, not elsewhere classified  Unsteadiness on feet  Abnormality of gait and mobility     Problem List Patient Active Problem List   Diagnosis Date Noted   Sepsis due to vancomycin resistant Enterococcus species ([place]) 06/06/2019   SIRS (systemic inflammatory response syndrome) ([place])  06/05/2019   Acute lower UTI 06/05/2019   VRE (vancomycin-resistant Enterococci) infection 06/05/2019   Anemia 06/05/2019   Skin ulcer of sacrum with necrosis of muscle ([place])    Urinary retention    Type 2 diabetes mellitus without complication, with long-term current use of insulin (HCC)    Tachycardia    Lower extremity edema    Acute metabolic encephalopathy    Obstructive sleep apnea    Morbid obesity with BMI of 60.0-69.9, adult (HCC)    Goals of care, counseling/discussion    Palliative care encounter    Sepsis ([place]) 04/27/2019   H/O insulin dependent diabetes mellitus 04/27/2019   History of CVA with residual  deficit 04/27/2019   Seizure disorder ([place]) 04/27/2019   Decubitus ulcer of sacral region, stage 4 ([place]) 04/27/2019    Lewis Moccasin, PT 09/06/2021, 5:02 PM  [place] MAIN [facility] SERVICES [address] [place], [place], [address] Phone: [phone]   Fax:  [phone]  Name: Paul Hall MRN: [id number] Date of Birth: 05/04/1995

## 2021-09-07 NOTE — Therapy (Signed)
[facility] MAIN [facility] SERVICES [address] [place], [place], [address] Phone: [phone]   Fax:  [phone]  Occupational Therapy Treatment  Patient Details  Name: Paul Hall MRN: [id number] Date of Birth: 12/24/1995 Referring Provider (OT): Lenise Herald   Encounter Date: 09/06/2021   OT End of Session - 09/07/21 1225     Visit Number 53    Number of Visits 72    Date for OT Re-Evaluation 09/08/21    Authorization Type Progress report period starting 06/28/21    OT Start Time 1605    OT Stop Time 1650    OT Time Calculation (min) 45 min    Equipment Utilized During Treatment tilt in space power wc    Activity Tolerance Patient tolerated treatment well    Behavior During Therapy WFL for tasks assessed/performed             Past Medical History:  Diagnosis Date   Diabetes mellitus (HCC)    Hypertension    Stroke [facility])     Past Surgical History:  Procedure Laterality Date   IVC FILTER PLACEMENT ([facility] HX)     LEG SURGERY     PEG TUBE PLACEMENT     TRACHEOSTOMY      There were no vitals filed for this visit.   Subjective Assessment - 09/06/21 1223     Subjective  Pt reports he feels like his hands have loosened up a little bit.    Patient is accompanied by: Family member    Pertinent History Pt. is a 26 y.o. male who was hospitalized, and diagnosed with COVID-19, sustained a CVA with resultant quadreplegia in 01/2019. Pt. was hospitalized with VRE  UTI. Pt. PMHx includes: urinary retention, Seizure Disorder, Obstructive Sleep Disorder, DM Type II, and Morbid obesity. Pt. resides at home with his parents.    Limitations BUE, and LE limitations with ROM, strength, motor control, and [facility] skills.    Patient Stated Goals To improve his arms, and hands.    Currently in Pain? No/denies    Pain Onset Today    Pain Onset More than a month ago           Occupational Therapy Treatment: Moist heat applied to bilat  hands x 5 min for pain reduction/muscle relaxation in prep for passive stretching.  Therapeutic Exercise: Pt. tolerated slow, prolonged passive stretching for bilateral wrist flexion, extension, and MP, PIP, and DIP flexion, and extension, working to increase functional range for self care and manipulation of ADL supplies.  Focus on PIP extension of all digits, as pt is working towards use of resting hand splints vs palm protector.  Performed passive stretching for bilat shoulders all planes, working to increase functional range for UB ADLs.   OT provided education and recommendation for Softpro resting hand splint; measured for R hand (Large).  Recommending pt start with R side only to see how pt tolerates it.  Provided information for father on ordering.    Response to Treatment: Good tolerance to passive stretching this day.  Pt verbalizes that hands are loosening after the Botox.  Focussed on PIP extension this visit as pt is working towards wearing Softpro resting hand splint vs palm protectors.  After slow, prolonged stretch, Pt was able to maintain >75% of PIP extension in the R hand when OT placed and rested pt's hand flat on pt's lap with hotpack over volar and dorsum of hand while the opposite hand was being stretched.  Nearly full passive PIP extension in all digits when wrists are slightly flexed, but Softpro is recommended as this splint can be adjusted as needed to match pt's available wrist and digit range.  Pt/father receptive to splinting recommendations and father plans to order for the R.  Pt continues to benefit from OT services to improve ROM, and facilitate active volitional movement in order to work towards engaging his BUEs into ADLs.          OT Education - 09/06/21 1224     Education Details splinting recommendations/benefits of Softpro functional resting hand splint    Person(s) Educated Patient;Parent(s)    Methods Verbal cues;Explanation    Comprehension Verbalized  understanding;Verbal cues required;Need further instruction                 OT Long Term Goals - 08/16/21 1553       OT LONG TERM GOAL #1   Title Pt. will improve FOTO score by 5 points for clinically significant improvements during ADLs, and IADLs,    Baseline 30th visit: FOTO: 37, 21st: FOTO 43. 10th Visit: FOTO score 42 Eval: FOTO score: 41 FOTO score: 39; 50th: FOTO 52    Time 12    Period Weeks    Status On-going    Target Date 09/08/21      OT LONG TERM GOAL #2   Title Pt. will improve right shoulder flexion by 10 degrees to assist caregivers with UE dressing    Baseline Pt. continues to require assist with UE dressing secondary to limited ROM; 08/16/21: Max A-total A UB dressing; R shoulder flexion/abd combo 0-100    Time 12    Period Weeks    Status On-going    Target Date 09/08/21      OT LONG TERM GOAL #3   Title Pt. will improve right Abduction by 10 degrees to assist caregivers with underarm hygiene care.    Baseline Eval: AROM abduction RUE 76. Pt. continues to present with limited bilateral abduction. 21st: AROM abduction RUE 80.06/16/2021: Right shulder abduction 108. 06/23/2021: Right shoulder abduction: 110; 08/16/21: abd 110    Time 12    Period Weeks    Status On-going    Target Date 09/08/21      OT LONG TERM GOAL #4   Title Pt. will demonstrate visual compensatory strategies for navigating through his environment.    Baseline 30th visit: Pt. continues to require assist to navigate through his environment. 20th: Pt continues to require assist to navigate environment, minimal knowledge of strategies. Eval: Limited 06/23/2021: Pt. continues to be limited, and requires assist  with navigating through his environment; 08/16/21: father reports pt only steers his own wc in 1 open room of their house, otherwise dep to navigate environment.    Time 12    Period Weeks    Status On-going    Target Date 09/08/21      OT LONG TERM GOAL #5   Title Pt. will demonstrate  visual compensatory strategies for tabletop tasks within his near space.    Baseline 06/16/2021: Pt. continues to present with visual limitations for tabletop tasks in his near space. Pt. continues to present with limited vision. 21st: Pt can identify objects on tabletop ~waterbottle sized, improves with contrasting colours, cannot ID small onjects; 08/16/21: no change from last assessment period    Time 12    Period Weeks    Status On-going    Target Date 09/08/21      OT LONG  TERM GOAL #6   Title Pt. will perform hand to mouth patterns with supervision in preparation for self-feeding.    Baseline 06/23/2021: Pt. is able to use his bilateral hands to perfrom hand to mouth patterns for self-feeding with finger foods. Pt. is now able to use his left hand to bring a water bottle to his mouth,a nd drink from it. 30th visit: Pt. continues to present with weakness limiting functional hand to mouth patterns of movement. Pt. is able to perform hand to mouth patterns, however reports weakness in his hand  limits a secure grasp on the utensil. Pt. requires assist to perform; Eval: Pt. has difficulty; 08/16/21: pt continues to use L hand for self feeding finger foods with set up.  Spasticity limits dexterity to manipulate eating utensils.    Time 12    Period Weeks    Status On-going    Target Date 09/08/21      OT LONG TERM GOAL #7   Title Pt. will perform one step self-grooming tasks with minA.    Baseline 06/23/2021: Pt. is able to perform the hand to face pattern with the left hand, however has difficulty actively sustaining his BUEs in elevation long enough to perform self-grooming tasks. 06/16/2021: Pt. is able to perform the hand to face pattern with the left hand, however has difficulty actively sustaining his BUEs in elevation long enough to perform self-grooming tasks; 08/16/21: same as 06/16/21    Time 12    Period Weeks    Status On-going    Target Date 09/08/21      OT LONG TERM GOAL #8   Title Pt  will complete UB dressing tasks with SETUP and assist to thread over hands only    Baseline 06/23/2021: Pt. requires MinA to thread arms, and requires maxA to donn over upper arms, MaxAx2 to donn over his back. 06/16/2021: Pt. has progressed  to require MinA to thread arms, and requires maxA to donn over upper arms, MaxAx2 to donn over his back; 08/16/21: min A to thread arms, max A to don over upper arms and over head    Time 12    Period Weeks    Status On-going    Target Date 09/08/21      OT LONG TERM GOAL  #9   TITLE Pt will improve B grip strength by 5# to assist in handling ADL items    Baseline 08/16/21: bilat girp 0#    Time 12    Period Weeks    Status On-going    Target Date 09/08/21      OT LONG TERM GOAL  #10   TITLE Pt will improve B lateral pinch strength by 3# to assist in UBD.    Baseline N/A: awaiting pinch meter to be fixed. Pt. unable to formulate proper positioning for lateral pinch assessment of the replacement pinch meter; 08/16/21: R 2#, L 1.5#    Time 12    Period Weeks    Status On-going    Target Date 09/08/21              Plan - 09/06/21 1250     Clinical Impression Statement Good tolerance to passive stretching this day.  Pt verbalizes that hands are loosening after the Botox.  Focussed on PIP extension this visit as pt is working towards wearing Softpro resting hand splint vs palm protectors.  After slow, prolonged stretch, Pt was able to maintain >75% of PIP extension in the R hand when OT  placed and rested pt's hand flat on pt's lap with hotpack over volar and dorsum of hand while the opposite hand was being stretched.  Nearly full passive PIP extension in all digits when wrists are slightly flexed, but Softpro is recommended as this splint can be adjusted as needed to match pt's available wrist and digit range.  Pt/father receptive to splinting recommendations and father plans to order for the R.  Pt continues to benefit from OT services to improve ROM, and  facilitate active volitional movement in order to work towards engaging his BUEs into ADLs.    OT Occupational Profile and History Detailed Assessment- Review of Records and additional review of physical, cognitive, psychosocial history related to current functional performance    Occupational performance deficits (Please refer to evaluation for details): ADL's;IADL's    Body Structure / Function / Physical Skills ADL;FMC;ROM;Dexterity;IADL;Endurance;Obesity;Strength;UE functional use    Rehab Potential Good    Clinical Decision Making Multiple treatment options, significant modification of task necessary    Comorbidities Affecting Occupational Performance: Presence of comorbidities impacting occupational performance    Modification or Assistance to Complete Evaluation  Max significant modification of tasks or assist is necessary to complete    OT Frequency 2x / week    OT Duration 12 weeks    OT Treatment/Interventions Self-care/ADL training;Therapeutic exercise;Neuromuscular education;Visual/perceptual remediation/compensation;Patient/family education;Therapeutic activities;DME and/or AE instruction;Passive range of motion;Moist Heat;Electrical Stimulation    Consulted and Agree with Plan of Care Patient             Patient will benefit from skilled therapeutic intervention in order to improve the following deficits and impairments:   Body Structure / Function / Physical Skills: ADL, FMC, ROM, Dexterity, IADL, Endurance, Obesity, Strength, UE functional use       Visit Diagnosis: Muscle weakness (generalized)  Other lack of coordination  History of CVA with residual deficit    Problem List Patient Active Problem List   Diagnosis Date Noted   Sepsis due to vancomycin resistant Enterococcus species (HCC) 06/06/2019   SIRS (systemic inflammatory response syndrome) (HCC) 06/05/2019   Acute lower UTI 06/05/2019   VRE (vancomycin-resistant Enterococci) infection 06/05/2019    Anemia 06/05/2019   Skin ulcer of sacrum with necrosis of muscle (HCC)    Urinary retention    Type 2 diabetes mellitus without complication, with long-term current use of insulin (HCC)    Tachycardia    Lower extremity edema    Acute metabolic encephalopathy    Obstructive sleep apnea    Morbid obesity with BMI of 60.0-69.9, adult (HCC)    Goals of care, counseling/discussion    Palliative care encounter    Sepsis (HCC) 04/27/2019   H/O insulin dependent diabetes mellitus 04/27/2019   History of CVA with residual deficit 04/27/2019   Seizure disorder (HCC) 04/27/2019   Decubitus ulcer of sacral region, stage 4 (HCC) 04/27/2019   Danelle EarthlyNancy Magie Ciampa, MS, OTR/L  Otis DialsNancy K Georgetta Crafton, OT 09/07/2021, 12:50 PM  [facility]IN [facility] [address] [place], [place], [address] Phone: [phone]   Fax:  [phone]  Name: Clint BolderSamuel Hakeem Fineberg MRN: [id number] Date of Birth: 10/12/1995

## 2021-09-08 ENCOUNTER — Ambulatory Visit: Payer: BC Managed Care – PPO | Admitting: Occupational Therapy

## 2021-09-08 ENCOUNTER — Ambulatory Visit: Payer: BC Managed Care – PPO

## 2021-09-08 DIAGNOSIS — Z6841 Body Mass Index (BMI) 40.0 and over, adult: Secondary | ICD-10-CM | POA: Diagnosis not present

## 2021-09-08 DIAGNOSIS — Z95828 Presence of other vascular implants and grafts: Secondary | ICD-10-CM | POA: Diagnosis not present

## 2021-09-10 DIAGNOSIS — Z8616 Personal history of COVID-19: Secondary | ICD-10-CM | POA: Diagnosis not present

## 2021-09-10 DIAGNOSIS — H4720 Unspecified optic atrophy: Secondary | ICD-10-CM | POA: Diagnosis not present

## 2021-09-10 DIAGNOSIS — H547 Unspecified visual loss: Secondary | ICD-10-CM | POA: Diagnosis not present

## 2021-09-13 ENCOUNTER — Encounter: Payer: Self-pay | Admitting: Occupational Therapy

## 2021-09-13 ENCOUNTER — Ambulatory Visit: Payer: BC Managed Care – PPO

## 2021-09-13 ENCOUNTER — Ambulatory Visit: Payer: BC Managed Care – PPO | Admitting: Occupational Therapy

## 2021-09-13 DIAGNOSIS — R262 Difficulty in walking, not elsewhere classified: Secondary | ICD-10-CM | POA: Diagnosis not present

## 2021-09-13 DIAGNOSIS — R269 Unspecified abnormalities of gait and mobility: Secondary | ICD-10-CM

## 2021-09-13 DIAGNOSIS — R2681 Unsteadiness on feet: Secondary | ICD-10-CM

## 2021-09-13 DIAGNOSIS — I6930 Unspecified sequelae of cerebral infarction: Secondary | ICD-10-CM

## 2021-09-13 DIAGNOSIS — R2689 Other abnormalities of gait and mobility: Secondary | ICD-10-CM

## 2021-09-13 DIAGNOSIS — M6281 Muscle weakness (generalized): Secondary | ICD-10-CM | POA: Diagnosis not present

## 2021-09-13 DIAGNOSIS — R278 Other lack of coordination: Secondary | ICD-10-CM | POA: Diagnosis not present

## 2021-09-14 NOTE — Therapy (Signed)
[place] MAIN [facility] SERVICES [address] [place], [place], [address] Phone: [phone]   Fax:  [phone]  Physical Therapy Treatment  Patient Details  Name: Paul Hall MRN: [id number] Date of Birth: April 29, 1995 Referring Provider (PT): Collene Mares, PA-C   Encounter Date: 09/13/2021   PT End of Session - 09/13/21 0818     Visit Number 44    Number of Visits 61    Date for PT Re-Evaluation 09/13/21    Authorization Type [employer] and [employer]- Need auth past 12th visit    Authorization Time Period 01/04/21-03/29/21; Recert [id number]    Progress Note Due on Visit 33    PT Start Time 1517    PT Stop Time 1559    PT Time Calculation (min) 42 min    Equipment Utilized During Treatment Gait belt   Hoyer lift   Activity Tolerance Patient tolerated treatment well   queasiness/nausea   Behavior During Therapy WFL for tasks assessed/performed             Past Medical History:  Diagnosis Date   Diabetes mellitus ([place])    Hypertension    Stroke [facility])     Past Surgical History:  Procedure Laterality Date   IVC FILTER PLACEMENT ([facility] HX)     LEG SURGERY     PEG TUBE PLACEMENT     TRACHEOSTOMY      There were no vitals filed for this visit.   Subjective Assessment - 09/13/21 0817     Subjective Patient states he had a good weekend and did practice sitting up on edge of bed at home which he said was initially scary but manageable.    Patient is accompained by: Family member    Pertinent History Paul Hall is a 46yoM who presents with severe weakness, quadriparesis, altered sensorium, and visual impairment s/p critical illness and prolonged hospitalization. Pt hospitalized in October 2020 with ARDS 2/2 COVID19 infection. Pt sustained a complex and lengthy hospitalization which included tracheostomy, prolonged sedation, ECMO. In this period pt sustained CVA and SDH. Pt has now been liberated from  tracheostomy and G-tube. Pt has since been hospitalized for wound infection and UTI. Pt lives with parents at home, has hospital bed and left chair, hoyer lift transfers, and power WC for mobility needs. Pt needs heavy physical assistance with ADL 2/2 BUE contractures and motor dysfunction.    Limitations Lifting;Standing;Walking;Writing;Reading;Sitting;House hold activities    How long can you sit comfortably? Patient reports uable to sit up long < 4 hours due to buttock discomfort    How long can you stand comfortably? Patient is currently unable to stand    How long can you walk comfortably? Patient is currently unable to walk    Diagnostic tests No recent relavent diagnostic tests    Patient Stated Goals I want to be able to possibly walk again    Currently in Pain? No/denies            INTERVENTIONS:    Therapeutic Activities:   Discussed using sit to stand lift to practice attempted standing and patient agreeable. Once equipment set up - patient stated he thought he was going to [employer] 1st over to mat and then use [place] lift. So plan transitioned to using hoyer to transfer patient from power wheelchair to mat table. Patient was positioned using his hoyer harness with assist of 1 other PT and patient's dad and safely positioned Dependently on middle of large mat table (to  enable his leg to hand off side to position later into standing). Using a wedge pillow and 2 pillow- patient was positioned into reclined (supine) position with knees at edge of mat and lower legs hanging off.  Once hoyer removed - patient was positioned up with max assist into sitting position at edge of mat. Patient was then set up using the sabina sit to stand lift-but as PT was attempting to don the harness - noticed that the straps would not fit through the D rings due to girth of patient and no other harness available so unable to attempt to use equipment for safety concerns.  Patient continued to sit at edge of mat  for approx 10-12 min - mostly static with feet positioned on floor - CGA to min assist without LOB. Patient did report increased fatigue so positioned back on his back and rested for a min. Noticed at some point during transfers that his hoyer pad had slid up too far.  Sat patient back up into sitting position and with assist of another PT on opp side patient attempted to stand with HHA of 2 PT's while his Dad grabbed the pad and pulled it down. Patient was able to push some with BUE down onto PT's arms and push through his Legs but unable to successfully stand enough to clear bottom off mat- but just enough to allow his father to pull pad down with 2 attempts.  Patient was then positioned back to reclining position and dependently positioned from mat table back to power chair via hoyer.  Patient reported exhaustion after transfer.   Education provided throughout session via VC/TC and demonstration to facilitate movement at target joints and correct muscle activation for all testing and exercises performed. Rationale for Evaluation and Treatment Rehabilitation                            PT Education - 09/14/21 0818     Education Details posture technique. transfer safety    Person(s) Educated Patient    Methods Explanation;Demonstration;Tactile cues;Verbal cues    Comprehension Verbalized understanding;Returned demonstration;Verbal cues required;Tactile cues required;Need further instruction              PT Short Term Goals - 05/10/21 1413       PT SHORT TERM GOAL #1   Title Pt will be independent with HEP in order to improve strength and balance in order to decrease fall risk and improve function at home and work.    Baseline 01/04/2021= No formal HEP in place; 12/12 no HEP in place; 05/10/2021-Patient and his father were able to report compliance with curent HEP consisting of mostly seated/reclined LE strengthening. Both verbalize no questions at this time.    Time 6     Period Weeks    Status Achieved    Target Date 02/15/21               PT Long Term Goals - 06/21/21 0848       PT LONG TERM GOAL #1   Title Patient will increase BLE gross strength by 1/2 muscle grade to improve functional strength for improved independence with potential gait, increased standing tolerance and increased ADL ability.    Baseline 01/04/2021- Patient presents with 1/5 to 3-/5 B LE strength with MMT; 12/12: goal partially met for Left knee/hip; 05/10/2021= 2-/5 bilateal Hip flex; 3+/5 bilateral Knee ext; 06/21/2021= Patient presents with 2-/5 bilateral Hip flex; 3+/5 bilateral knee  ext/flex; 2-/5 left ankle DF; 0/5 right ankle- and able to increase reps and resistance with LE's    Time 12    Period Weeks    Status Partially Met    Target Date 03/29/21      PT LONG TERM GOAL #2   Title Patient will tolerate sitting unsupported demonstrating erect sitting posture for 15 minutes with CGA to demonstrate improved back extensor strength and improved sitting tolerance.    Baseline 01/04/2021- Patient confied to sitting in lift chair or electric power chair with back support and unable to sit upright without physical assistance; 12/12: tolerates <1 minutes upright unsupported sitting. 05/10/2021=static sit with forward trunk lean  in his power wheelchair without back support x approx 3 min. 06/21/2021=Unable to assess today due to patient with acute back pain but on previous visit able to sit x 8 min without back support.    Time 12    Period Weeks    Status On-going    Target Date 09/13/21      PT LONG TERM GOAL #3   Title Patient will demonstrate ability to perform static standing in // bars > 2 min with Max Assist  without loss of balance and fair posture for improved overall strength for pre-gait and transfer activities.    Baseline 01/04/2021= Patient current uanble to stand- Dependent on hoyer or sit to stand lift for transfers. 05/10/2021=Not appropriate yet- Currently still  dependent with all transfers using hoyer. 06/21/2021= Patient continuing now to focus on LE strengthening to prepare for standing-unable to try today due to acute low back pain-  planning on attempting in new cert period.    Time 12    Period Weeks    Status On-going    Target Date 09/13/21      PT LONG TERM GOAL #4   Title Pt will improve FOTO score by 10 points or more demonstrating improved perceived functional ability    Baseline FOTO 7 on 10/17; 03/15/21: FOTO 12; 05/10/2021 06/21/2021= 1    Time 12    Period Weeks    Status On-going    Target Date 03/29/21                   Plan - 09/13/21 0819     Clinical Impression Statement Today's focus was going to be to see how patient could stand using a sit to stand lift however pad for lift did not fit appropriately so unable to practice. However patient was able to focus on sitting at edge of mat- today with feet on floor and work on pulling himself up from reclined position to sitting with max assist . He also attempted to stand 2x with assist of 2 PT's but unsuccessful to clear his bottom. He was fatigued after transfers today but did exhibit good effort with all transfers and static sit at edge of mat today. Patient would benefit from additional skilled PT intervention to improve strength, balance and mobility for improved quality of life.    Personal Factors and Comorbidities Comorbidity 3+;Time since onset of injury/illness/exacerbation    Comorbidities CVA, diabetes, Seizures    Examination-Activity Limitations Bathing;Bed Mobility;Bend;Caring for Others;Carry;Dressing;Hygiene/Grooming;Lift;Locomotion Level;Reach Overhead;Self Feeding;Sit;Squat;Stairs;Stand;Transfers;Toileting    Examination-Participation Restrictions Cleaning;Community Activity;Driving;Laundry;Medication Management;Meal Prep;Occupation;Personal Finances;Shop;Yard Work;Volunteer    Stability/Clinical Decision Making Evolving/Moderate complexity    Rehab Potential  Fair    PT Frequency 2x / week    PT Duration 12 weeks    PT Treatment/Interventions ADLs/Self Care Home Management;Cryotherapy;Electrical Stimulation;Moist Heat;Ultrasound;DME Instruction;Gait  training;Stair training;Functional mobility training;Therapeutic exercise;Balance training;Patient/family education;Orthotic Fit/Training;Neuromuscular re-education;Wheelchair mobility training;Manual techniques;Passive range of motion;Dry needling;Energy conservation;Taping;Visual/perceptual remediation/compensation;Joint Manipulations    PT Next Visit Plan core strength- sitting at EOM, postural control, sabina sit to stand if able; Continue with progressive LE Strengthening    PT Home Exercise Plan No changes to HEP today    Consulted and Agree with Plan of Care Patient;Family member/caregiver    Family Member Consulted Dad             Patient will benefit from skilled therapeutic intervention in order to improve the following deficits and impairments:  Abnormal gait, Decreased activity tolerance, Decreased balance, Decreased cognition, Cardiopulmonary status limiting activity, Decreased coordination, Decreased endurance, Decreased knowledge of use of DME, Decreased mobility, Decreased range of motion, Decreased safety awareness, Decreased strength, Difficulty walking, Hypomobility, Impaired perceived functional ability, Impaired flexibility, Impaired UE functional use, Impaired vision/preception, Improper body mechanics, Postural dysfunction, Obesity  Visit Diagnosis: Muscle weakness (generalized)  Other lack of coordination  History of CVA with residual deficit  Difficulty in walking, not elsewhere classified  Unsteadiness on feet  Abnormality of gait and mobility  Other abnormalities of gait and mobility     Problem List Patient Active Problem List   Diagnosis Date Noted   Sepsis due to vancomycin resistant Enterococcus species (HCC) 06/06/2019   SIRS (systemic inflammatory  response syndrome) (HCC) 06/05/2019   Acute lower UTI 06/05/2019   VRE (vancomycin-resistant Enterococci) infection 06/05/2019   Anemia 06/05/2019   Skin ulcer of sacrum with necrosis of muscle ([place])    Urinary retention    Type 2 diabetes mellitus without complication, with long-term current use of insulin (HCC)    Tachycardia    Lower extremity edema    Acute metabolic encephalopathy    Obstructive sleep apnea    Morbid obesity with BMI of 60.0-69.9, adult ([place])    Goals of care, counseling/discussion    Palliative care encounter    Sepsis ([place]) 04/27/2019   H/O insulin dependent diabetes mellitus 04/27/2019   History of CVA with residual deficit 04/27/2019   Seizure disorder ([place]) 04/27/2019   Decubitus ulcer of sacral region, stage 4 ([place]) 04/27/2019    Lewis Moccasin, PT 09/14/2021, 8:39 AM  [place] MAIN [facility] SERVICES [address] [place], [place], [address] Phone: [phone]   Fax:  [phone]  Name: Herrings Grosser MRN: [id number] Date of Birth: January 10, 1996

## 2021-09-15 ENCOUNTER — Ambulatory Visit: Payer: BC Managed Care – PPO

## 2021-09-15 ENCOUNTER — Ambulatory Visit: Payer: BC Managed Care – PPO | Admitting: Occupational Therapy

## 2021-09-15 DIAGNOSIS — R2681 Unsteadiness on feet: Secondary | ICD-10-CM | POA: Diagnosis not present

## 2021-09-15 DIAGNOSIS — M6281 Muscle weakness (generalized): Secondary | ICD-10-CM

## 2021-09-15 DIAGNOSIS — R278 Other lack of coordination: Secondary | ICD-10-CM | POA: Diagnosis not present

## 2021-09-15 DIAGNOSIS — I6930 Unspecified sequelae of cerebral infarction: Secondary | ICD-10-CM

## 2021-09-15 DIAGNOSIS — R2689 Other abnormalities of gait and mobility: Secondary | ICD-10-CM

## 2021-09-15 DIAGNOSIS — R262 Difficulty in walking, not elsewhere classified: Secondary | ICD-10-CM

## 2021-09-15 DIAGNOSIS — R269 Unspecified abnormalities of gait and mobility: Secondary | ICD-10-CM

## 2021-09-15 NOTE — Therapy (Signed)
[facility] MAIN [facility] SERVICES [address] [place], [place], [address] Phone: [phone]   Fax:  [phone]  Occupational Therapy Treatment  Patient Details  Name: Paul Hall MRN: [id number] Date of Birth: 1996-01-17 Referring Provider (OT): Lenise Herald   Encounter Date: 09/15/2021   OT End of Session - 09/15/21 1601     Visit Number 55    Number of Visits 96    Date for OT Re-Evaluation 12/07/21    Authorization Type Progress report period starting 06/28/21    OT Start Time 1600    OT Stop Time 1645    OT Time Calculation (min) 45 min    Equipment Utilized During Treatment tilt in space power wc    Activity Tolerance Patient tolerated treatment well    Behavior During Therapy WFL for tasks assessed/performed             Past Medical History:  Diagnosis Date   Diabetes mellitus (HCC)    Hypertension    Stroke [facility])     Past Surgical History:  Procedure Laterality Date   IVC FILTER PLACEMENT ([facility] HX)     LEG SURGERY     PEG TUBE PLACEMENT     TRACHEOSTOMY      There were no vitals filed for this visit.   Subjective Assessment - 09/15/21 1600     Subjective  Pt reports plan to use built up handle this weekend. States "I have to work hard"    Patient is accompanied by: Family member    Pertinent History Pt. is a 26 y.o. male who was hospitalized, and diagnosed with COVID-19, sustained a CVA with resultant quadreplegia in 01/2019. Pt. was hospitalized with VRE  UTI. Pt. PMHx includes: urinary retention, Seizure Disorder, Obstructive Sleep Disorder, DM Type II, and Morbid obesity. Pt. resides at home with his parents.    Limitations BUE, and LE limitations with ROM, strength, motor control, and [facility] skills.    Patient Stated Goals To improve his arms, and hands.    Currently in Pain? No/denies    Pain Onset Today    Pain Onset More than a month ago               Therapeutic Exercise  Pt tolerated 2  set x 8 reps each over head press (1 set each then 1 set combined), elbow flexion/extension, horizontal abduction, crunches, oblique crunches. Grip strengthener using 1 red band - requires setup placed in hand, tolerates 1 set x 20 reps each hand.  Therapeutic Activities Pt worked on reaching for [employer] moving them through first 2 levels of horizontal rungs. MIN cues to locate rings using L hand, reaches fully across torso for first and second level. MOD A hand over hand assist for R hand to move rings through lowest level.              OT Education - 09/15/21 1601     Education Details splinting recommendations/benefits of Softpro functional resting hand splint    Person(s) Educated Patient;Parent(s)    Methods Verbal cues;Explanation    Comprehension Verbalized understanding;Verbal cues required;Need further instruction              OT Short Term Goals - 09/13/21 1641       OT SHORT TERM GOAL #1   Title Pt and caregiver will obtain splint and follow wearing schedule consistently 7 days/week    Baseline 09/14/21: working to obtain splint    Time  6    Period Weeks    Status New    Target Date 10/26/21               OT Long Term Goals - 09/13/21 1623       OT LONG TERM GOAL #1   Title Pt. will improve FOTO score by 5 points for clinically significant improvements during ADLs, and IADLs,    Baseline 30th visit: FOTO: 37, 21st: FOTO 43. 10th Visit: FOTO score 42 Eval: FOTO score: 41 FOTO score: 39; 50th: FOTO 52; 09/13/21: FOTO 41    Time 12    Period Weeks    Status On-going    Target Date 12/07/21      OT LONG TERM GOAL #2   Title Pt. will improve right shoulder flexion by 15 degrees to assist caregivers with UE dressing    Baseline Pt. continues to require assist with UE dressing secondary to limited ROM; 08/16/21: Max A-total A UB dressing; R shoulder flexion/abd combo 0-100; 09/13/21: R shoulder flexion/abd combo 0-110, R shoulder hiking noted    Time  12    Period Weeks    Status Revised    Target Date 12/07/21      OT LONG TERM GOAL #3   Title Pt. will improve right Abduction by 10 degrees to assist caregivers with underarm hygiene care.    Baseline Eval: AROM abduction RUE 76. Pt. continues to present with limited bilateral abduction. 21st: AROM abduction RUE 80.06/16/2021: Right shulder abduction 108. 06/23/2021: Right shoulder abduction: 110; 08/16/21: abd 110    Time 12    Period Weeks    Status Achieved    Target Date 09/08/21      OT LONG TERM GOAL #4   Title Pt. will demonstrate visual compensatory strategies for navigating through his environment.    Baseline 30th visit: Pt. continues to require assist to navigate through his environment. 20th: Pt continues to require assist to navigate environment, minimal knowledge of strategies. Eval: Limited 06/23/2021: Pt. continues to be limited, and requires assist  with navigating through his environment; 08/16/21: father reports pt only steers his own wc in 1 open room of their house, otherwise dep to navigate environment. 09/13/21: limited steering outside of house, steers in 1 room of house. 09/13/21: pt seeing low vision OT specialist to address.    Time 12    Period Weeks    Status Deferred    Target Date 09/08/21      OT LONG TERM GOAL #5   Title Pt. will demonstrate visual compensatory strategies for tabletop tasks within his near space.    Baseline 06/16/2021: Pt. continues to present with visual limitations for tabletop tasks in his near space. Pt. continues to present with limited vision. 21st: Pt can identify objects on tabletop ~waterbottle sized, improves with contrasting colours, cannot ID small onjects; 08/16/21: no change from last assessment period. 09/13/21: pt seeing low vision OT specialist to address.    Time 12    Period Weeks    Status Deferred    Target Date 09/08/21      OT LONG TERM GOAL #6   Title Pt. will perform self feeding with SETUP using adaptive equipment     Baseline 06/23/2021: Pt. is able to use his bilateral hands to perfrom hand to mouth patterns for self-feeding with finger foods. Pt. is now able to use his left hand to bring a water bottle to his mouth,a nd drink from it. 30th visit: Pt. continues  to present with weakness limiting functional hand to mouth patterns of movement. Pt. is able to perform hand to mouth patterns, however reports weakness in his hand  limits a secure grasp on the utensil. Pt. requires assist to perform; Eval: Pt. has difficulty; 08/16/21: pt continues to use L hand for self feeding finger foods with set up.  Spasticity limits dexterity to manipulate eating utensils. 09/13/21: can grip built up handle however requires MIN A 2/2 vision and wrist ROM deficits.    Time 12    Period Weeks    Status Revised    Target Date 12/07/21      OT LONG TERM GOAL #7   Title Pt. will perform one step self-grooming tasks with SETUP    Baseline 06/23/2021: Pt. is able to perform the hand to face pattern with the left hand, however has difficulty actively sustaining his BUEs in elevation long enough to perform self-grooming tasks. 06/16/2021: Pt. is able to perform the hand to face pattern with the left hand, however has difficulty actively sustaining his BUEs in elevation long enough to perform self-grooming tasks; 08/16/21: same as 06/16/21. 09/13/21: limited by trunk and sustaining BUEs    Time 12    Period Weeks    Status Revised    Target Date 12/07/21      OT LONG TERM GOAL #8   Title Pt will complete UB dressing tasks with SETUP and assist to thread over hands only    Baseline 06/23/2021: Pt. requires MinA to thread arms, and requires maxA to donn over upper arms, MaxAx2 to donn over his back. 06/16/2021: Pt. has progressed  to require MinA to thread arms, and requires maxA to donn over upper arms, MaxAx2 to donn over his back; 08/16/21: min A to thread arms, max A to don over upper arms and over head. 09/13/21: MOD A    Time 12    Period Weeks     Status On-going    Target Date 12/07/21      OT LONG TERM GOAL  #9   TITLE Pt will improve B grip strength by 10# to assist in handling ADL items    Baseline 08/16/21: bilat girp 0#; 09/13/21: L grip 6#, R grip 9#    Time 12    Period Weeks    Status Revised    Target Date 12/06/21      OT LONG TERM GOAL  #10   TITLE Pt will improve B lateral pinch strength by 8# to assist in UBD.    Baseline N/A: awaiting pinch meter to be fixed. Pt. unable to formulate proper positioning for lateral pinch assessment of the replacement pinch meter; 08/16/21: R 2#, L 1.5#; ; 09/13/21: R 6#, L 2#    Time 12    Period Weeks    Status Revised    Target Date 12/07/21                   Plan - 09/15/21 1602     Clinical Impression Statement Improved tolerance and motivation for BUE therex, grips ergonomic gripper with SETUP and tolerates 1 red band. Completes first 2 levels of [place] with MIN A hand over hand to locate balls. Pt continues to benefit from OT services to improve ROM, and facilitate active volitional movement in order to work towards engaging his BUEs into ADLs.    OT Occupational Profile and History Detailed Assessment- Review of Records and additional review of physical, cognitive, psychosocial history related to current  functional performance    Occupational performance deficits (Please refer to evaluation for details): ADL's;IADL's    Body Structure / Function / Physical Skills ADL;FMC;ROM;Dexterity;IADL;Endurance;Obesity;Strength;UE functional use    Rehab Potential Good    Clinical Decision Making Multiple treatment options, significant modification of task necessary    Comorbidities Affecting Occupational Performance: Presence of comorbidities impacting occupational performance    Modification or Assistance to Complete Evaluation  Max significant modification of tasks or assist is necessary to complete    OT Frequency 2x / week    OT Duration 12 weeks    OT  Treatment/Interventions Self-care/ADL training;Therapeutic exercise;Neuromuscular education;Visual/perceptual remediation/compensation;Patient/family education;Therapeutic activities;DME and/or AE instruction;Passive range of motion;Moist Heat;Electrical Stimulation    Consulted and Agree with Plan of Care Patient             Patient will benefit from skilled therapeutic intervention in order to improve the following deficits and impairments:   Body Structure / Function / Physical Skills: ADL, FMC, ROM, Dexterity, IADL, Endurance, Obesity, Strength, UE functional use       Visit Diagnosis: Muscle weakness (generalized)  History of CVA with residual deficit  Other lack of coordination    Problem List Patient Active Problem List   Diagnosis Date Noted   Sepsis due to vancomycin resistant Enterococcus species (HCC) 06/06/2019   SIRS (systemic inflammatory response syndrome) (HCC) 06/05/2019   Acute lower UTI 06/05/2019   VRE (vancomycin-resistant Enterococci) infection 06/05/2019   Anemia 06/05/2019   Skin ulcer of sacrum with necrosis of muscle (HCC)    Urinary retention    Type 2 diabetes mellitus without complication, with long-term current use of insulin (HCC)    Tachycardia    Lower extremity edema    Acute metabolic encephalopathy    Obstructive sleep apnea    Morbid obesity with BMI of 60.0-69.9, adult (HCC)    Goals of care, counseling/discussion    Palliative care encounter    Sepsis (HCC) 04/27/2019   H/O insulin dependent diabetes mellitus 04/27/2019   History of CVA with residual deficit 04/27/2019   Seizure disorder (HCC) 04/27/2019   Decubitus ulcer of sacral region, stage 4 (HCC) 04/27/2019   Kathie Dikeevin Jacier Gladu, M.S. OTR/L  09/15/21, 4:54 PM  ascom [phone]   [facility]IN [facility] [address] [place], [place], [id number] Phone: [phone]   Fax:  [phone]  Name: Clint BolderSamuel Hakeem Howse MRN:  [id number] Date of Birth: 1995/12/09

## 2021-09-15 NOTE — Addendum Note (Signed)
Addended by: Leonides Cave on: 09/15/2021 08:20 AM   Modules accepted: Orders

## 2021-09-15 NOTE — Therapy (Signed)
[place] MAIN [facility] SERVICES [address] [place], [place], [address] Phone: [phone]   Fax:  [phone]  Physical Therapy Treatment/Recertification for dates 09/15/2021- 12/08/2021  Patient Details  Name: Paul Hall MRN: [id number] Date of Birth: 1995/06/22 Referring Provider (PT): Collene Mares, PA-C   Encounter Date: 09/15/2021   PT End of Session - 09/15/21 0801     Visit Number 45    Number of Visits 61    Date for PT Re-Evaluation 12/08/21    Authorization Type [employer] and [employer]- Need auth past 12th visit    Authorization Time Period 01/04/21-03/29/21; Recert [id number]/80/3212-2/48/2500    Progress Note Due on Visit 22    PT Start Time 1515    PT Stop Time 1559    PT Time Calculation (min) 44 min    Equipment Utilized During Treatment Gait belt   Hoyer lift   Activity Tolerance Patient tolerated treatment well   queasiness/nausea   Behavior During Therapy WFL for tasks assessed/performed             Past Medical History:  Diagnosis Date   Diabetes mellitus ([place])    Hypertension    Stroke [facility])     Past Surgical History:  Procedure Laterality Date   IVC FILTER PLACEMENT ([facility] HX)     LEG SURGERY     PEG TUBE PLACEMENT     TRACHEOSTOMY      There were no vitals filed for this visit.   Subjective Assessment - 09/15/21 0800     Subjective Patient reports no significant changes since last visit.    Patient is accompained by: Family member    Pertinent History Paul Hall is a 59yoM who presents with severe weakness, quadriparesis, altered sensorium, and visual impairment s/p critical illness and prolonged hospitalization. Pt hospitalized in October 2020 with ARDS 2/2 COVID19 infection. Pt sustained a complex and lengthy hospitalization which included tracheostomy, prolonged sedation, ECMO. In this period pt sustained CVA and SDH. Pt has now been liberated from tracheostomy and G-tube. Pt has since  been hospitalized for wound infection and UTI. Pt lives with parents at home, has hospital bed and left chair, hoyer lift transfers, and power WC for mobility needs. Pt needs heavy physical assistance with ADL 2/2 BUE contractures and motor dysfunction.    Limitations Lifting;Standing;Walking;Writing;Reading;Sitting;House hold activities    How long can you sit comfortably? Patient reports uable to sit up long < 4 hours due to buttock discomfort    How long can you stand comfortably? Patient is currently unable to stand    How long can you walk comfortably? Patient is currently unable to walk    Diagnostic tests No recent relavent diagnostic tests    Patient Stated Goals I want to be able to possibly walk again    Currently in Pain? No/denies           *Unable to transfer patient due to no hoyer available during today's visit.   INTERVENTIONS:   Discussed goals for recert visit:  Patient technically presents with 2-/5 B hip flex/abd/add - but he is able to raise his hip up to approx 100 deg which has improved. 3+/5 Bilateral knee ext, 2-/5 left ankle and 0/5 right ankle. (Goal progressing)   Sitting goal: on last visit- 09/13/2021- patient was able to sit unsupported x 8 min at edge of mat. (Goal Progressing)   Standing goal: Patient has attempted standing 2x in past two week- max Assist of 2  people - only once was he successful to clearing his bottom from chair - Will continue to be a focus during the new certification. (Goal progressing)   FOTO goal- scored 9 today (improved from scoring 1 on 06/21/2021)     Therex:   Seated hip march- AROM- B LE-15 reps Seated knee ext -AROM B LE-15 reps Seated hip add with ball squeeze- with 5 sec hold x 10 reps Seated hip abd with manual resistance- Hold 5 sec x 10 reps Seated hamstring (manual resistance) - x 15 each BLE   Education provided throughout session via VC/TC and demonstration to facilitate movement at target joints and correct  muscle activation for all testing and exercises performed.   Rationale for Evaluation and Treatment Rehabilitation                          PT Education - 09/16/21 0801     Education Details Plan of care for recert; Exercise technique    Person(s) Educated Patient    Methods Explanation;Demonstration;Tactile cues;Verbal cues    Comprehension Verbalized understanding;Returned demonstration;Verbal cues required;Need further instruction;Tactile cues required              PT Short Term Goals - 05/10/21 1413       PT SHORT TERM GOAL #1   Title Pt will be independent with HEP in order to improve strength and balance in order to decrease fall risk and improve function at home and work.    Baseline 01/04/2021= No formal HEP in place; 12/12 no HEP in place; 05/10/2021-Patient and his father were able to report compliance with curent HEP consisting of mostly seated/reclined LE strengthening. Both verbalize no questions at this time.    Time 6    Period Weeks    Status Achieved    Target Date 02/15/21               PT Long Term Goals - 09/15/21 1525       PT LONG TERM GOAL #1   Title Patient will increase BLE gross strength by 1/2 muscle grade to improve functional strength for improved independence with potential gait, increased standing tolerance and increased ADL ability.    Baseline 01/04/2021- Patient presents with 1/5 to 3-/5 B LE strength with MMT; 12/12: goal partially met for Left knee/hip; 05/10/2021= 2-/5 bilateal Hip flex; 3+/5 bilateral Knee ext; 06/21/2021= Patient presents with 2-/5 bilateral Hip flex; 3+/5 bilateral knee ext/flex; 2-/5 left ankle DF; 0/5 right ankle- and able to increase reps and resistance with LE's. 09/15/2021- Patient technically presents with 2-/5 B hip flex/abd/add - but he is able to raise his hip up to approx 100 deg which has improved. 3+/5 Bilateral knee ext, 2-/5 left ankle and 0/5 right ankle.    Time 12    Period Weeks    Status  On-going    Target Date 12/08/21      PT LONG TERM GOAL #2   Title Patient will tolerate sitting unsupported demonstrating erect sitting posture for 15 minutes with CGA to demonstrate improved back extensor strength and improved sitting tolerance.    Baseline 01/04/2021- Patient confied to sitting in lift chair or electric power chair with back support and unable to sit upright without physical assistance; 12/12: tolerates <1 minutes upright unsupported sitting. 05/10/2021=static sit with forward trunk lean  in his power wheelchair without back support x approx 3 min. 06/21/2021=Unable to assess today due to patient with acute back pain  but on previous visit able to sit x 8 min without back support. 09/15/2021- on last visit- 09/13/2021- patient was able to sit unsupported x 8 min at edge of mat.    Time 12    Period Weeks    Status On-going    Target Date 12/08/21      PT LONG TERM GOAL #3   Title Patient will demonstrate ability to perform static standing in // bars > 2 min with Max Assist  without loss of balance and fair posture for improved overall strength for pre-gait and transfer activities.    Baseline 01/04/2021= Patient current uanble to stand- Dependent on hoyer or sit to stand lift for transfers. 05/10/2021=Not appropriate yet- Currently still dependent with all transfers using hoyer. 06/21/2021= Patient continuing now to focus on LE strengthening to prepare for standing-unable to try today due to acute low back pain-  planning on attempting in new cert period. 09/15/2021- Patient has attempted standing 2x in past two week- max Assist of 2 people - only once was he successful to clearing his bottom from chair - Will continue to be a focus during the new certification.    Time 12    Period Weeks    Status On-going    Target Date 12/08/21      PT LONG TERM GOAL #4   Title Pt will improve FOTO score by 10 points or more demonstrating improved perceived functional ability    Baseline FOTO 7 on  10/17; 03/15/21: FOTO 12; 05/10/2021 06/21/2021= 1; 09/15/2021= 9    Time 12    Period Weeks    Status On-going    Target Date 12/08/21      PT LONG TERM GOAL #5   Title Patient will perform sit to stand transfer with appropriate AD and max assist of 2 people with 75% consistency to prepare for pregait activities.    Baseline 09/15/2021= Patient unable to stand well- unable to clear his bottom off chair with Max assist of 2 persons.    Time 12    Period Weeks    Status New    Target Date 12/08/21                   Plan - 09/16/21 0802     Clinical Impression Statement Patient presents with good motivation for today's recert visit. While discussing goals- patient has made some functional progress during past cert. He is able to raise his leg better and hold quad contraction with some eccentric control. Functionally he was able to progress to now attempting to stand however continues to present with significant LE weakness and difficulty standing. Added a sit to stand transfer goal as this will be included as an area of focus in new cert. He also demo improved self perceived ability as seen by improved FOTO score. He was also able to improve his sitting at edge of mat time - unsupported. Patient's condition has the potential to improve in response to therapy. Maximum improvement is yet to be obtained. The anticipated improvement is attainable and reasonable in a generally predictable time.    Personal Factors and Comorbidities Comorbidity 3+;Time since onset of injury/illness/exacerbation    Comorbidities CVA, diabetes, Seizures    Examination-Activity Limitations Bathing;Bed Mobility;Bend;Caring for Others;Carry;Dressing;Hygiene/Grooming;Lift;Locomotion Level;Reach Overhead;Self Feeding;Sit;Squat;Stairs;Stand;Transfers;Toileting    Examination-Participation Restrictions Cleaning;Community Activity;Driving;Laundry;Medication Management;Meal Prep;Occupation;Personal Finances;Shop;Yard  Work;Volunteer    Stability/Clinical Decision Making Evolving/Moderate complexity    Rehab Potential Fair    PT Frequency 2x / week  PT Duration 12 weeks    PT Treatment/Interventions ADLs/Self Care Home Management;Cryotherapy;Electrical Stimulation;Moist Heat;Ultrasound;DME Instruction;Gait training;Stair training;Functional mobility training;Therapeutic exercise;Balance training;Patient/family education;Orthotic Fit/Training;Neuromuscular re-education;Wheelchair mobility training;Manual techniques;Passive range of motion;Dry needling;Energy conservation;Taping;Visual/perceptual remediation/compensation;Joint Manipulations    PT Next Visit Plan core strength- sitting at EOM, postural control, sabina sit to stand if able; Continue with progressive LE Strengthening    PT Home Exercise Plan No changes to HEP today    Consulted and Agree with Plan of Care Patient;Family member/caregiver    Family Member Consulted Dad             Patient will benefit from skilled therapeutic intervention in order to improve the following deficits and impairments:  Abnormal gait, Decreased activity tolerance, Decreased balance, Decreased cognition, Cardiopulmonary status limiting activity, Decreased coordination, Decreased endurance, Decreased knowledge of use of DME, Decreased mobility, Decreased range of motion, Decreased safety awareness, Decreased strength, Difficulty walking, Hypomobility, Impaired perceived functional ability, Impaired flexibility, Impaired UE functional use, Impaired vision/preception, Improper body mechanics, Postural dysfunction, Obesity  Visit Diagnosis: Muscle weakness (generalized)  History of CVA with residual deficit  Other lack of coordination  Difficulty in walking, not elsewhere classified  Unsteadiness on feet  Abnormality of gait and mobility  Other abnormalities of gait and mobility     Problem List Patient Active Problem List   Diagnosis Date Noted   Sepsis  due to vancomycin resistant Enterococcus species (HCC) 06/06/2019   SIRS (systemic inflammatory response syndrome) (HCC) 06/05/2019   Acute lower UTI 06/05/2019   VRE (vancomycin-resistant Enterococci) infection 06/05/2019   Anemia 06/05/2019   Skin ulcer of sacrum with necrosis of muscle ([place])    Urinary retention    Type 2 diabetes mellitus without complication, with long-term current use of insulin (HCC)    Tachycardia    Lower extremity edema    Acute metabolic encephalopathy    Obstructive sleep apnea    Morbid obesity with BMI of 60.0-69.9, adult ([place])    Goals of care, counseling/discussion    Palliative care encounter    Sepsis ([place]) 04/27/2019   H/O insulin dependent diabetes mellitus 04/27/2019   History of CVA with residual deficit 04/27/2019   Seizure disorder ([place]) 04/27/2019   Decubitus ulcer of sacral region, stage 4 ([place]) 04/27/2019    Lewis Moccasin, PT 09/16/2021, 8:39 AM  [place] MAIN [facility] SERVICES [address] [place], [place], [address] Phone: [phone]   Fax:  [phone]  Name: Paul Hall MRN: [id number] Date of Birth: 02-Jul-1995

## 2021-09-15 NOTE — Therapy (Signed)
[facility] MAIN [facility] [address] [place], [place], [address] Phone: [phone]   Fax:  [phone]  Occupational Therapy Treatment  Patient Details  Name: Paul Hall MRN: [id number] Date of Birth: [birth year] Referring Provider (OT): Lenise HeraldBenjamin Mann   Encounter Date: 09/13/2021   OT End of Session - 09/14/21 1405     Visit Number 54    Number of Visits 96    Date for OT Re-Evaluation 12/07/21    Authorization Type Progress report period starting 06/28/21    OT Start Time 1600    OT Stop Time 1650    OT Time Calculation (min) 50 min    Equipment Utilized During Treatment tilt in space power wc    Activity Tolerance Patient tolerated treatment well    Behavior During Therapy [facility] for tasks assessed/performed             Past Medical History:  Diagnosis Date   Diabetes mellitus (HCC)    Hypertension    Stroke [facility](HCC)     Past Surgical History:  Procedure Laterality Date   IVC FILTER PLACEMENT ([facility] HX)     LEG SURGERY     PEG TUBE PLACEMENT     TRACHEOSTOMY      There were no vitals filed for this visit.   Therapeutic Exercise Completed measurements as described below. Grip measurements obtained L grip 6#, L lateral pinch 2#, R grip 9#, R lateral pinch 5#.   RUE shoulder flexion: 110, elbow 60-140, wrist flexion 5- 60,  LUE shoulder flexion 120 (125), elbow 30-140, wrist flexion 40, wrist extension 40    Goals and measurements obtained. Continued discussions on Softpro resting hand splint, order faxed to patients PCP. Goals reviewed and measurements obtained - noted improvements to B grip strength and pinch strength. Near R shoulder flexion ROM goal to functionally assist with UBD, pt's father reports elbow ROM limiting dressing. Pt continues to benefit from OT services to improve ROM, and facilitate active volitional movement in order to work towards engaging his BUEs into ADLs.                OT Education - 09/14/21 1405     Education Details splinting recommendations/benefits of Softpro functional resting hand splint    Person(s) Educated Patient;Parent(s)    Methods Verbal cues;Explanation    Comprehension Verbalized understanding;Verbal cues required;Need further instruction              OT Short Term Goals - 09/13/21 1641       OT SHORT TERM GOAL #1   Title Pt and caregiver will obtain splint and follow wearing schedule consistently 7 days/week    Baseline 09/14/21: working to obtain splint    Time 6    Period Weeks    Status New    Target Date 10/26/21               OT Long Term Goals - 09/13/21 1623       OT LONG TERM GOAL #1   Title Pt. will improve FOTO score by 5 points for clinically significant improvements during ADLs, and IADLs,    Baseline 30th visit: FOTO: 37, 21st: FOTO 43. 10th Visit: FOTO score 42 Eval: FOTO score: 41 FOTO score: 39; 50th: FOTO 52; 09/13/21: FOTO 41    Time 12    Period Weeks    Status On-going    Target Date 12/07/21      OT LONG TERM GOAL #2  Title Pt. will improve right shoulder flexion by 15 degrees to assist caregivers with UE dressing    Baseline Pt. continues to require assist with UE dressing secondary to limited ROM; 08/16/21: Max A-total A UB dressing; R shoulder flexion/abd combo 0-100; 09/13/21: R shoulder flexion/abd combo 0-110, R shoulder hiking noted    Time 12    Period Weeks    Status Revised    Target Date 12/07/21      OT LONG TERM GOAL #3   Title Pt. will improve right Abduction by 10 degrees to assist caregivers with underarm hygiene care.    Baseline Eval: AROM abduction RUE 76. Pt. continues to present with limited bilateral abduction. 21st: AROM abduction RUE 80.06/16/2021: Right shulder abduction 108. 06/23/2021: Right shoulder abduction: 110; 08/16/21: abd 110    Time 12    Period Weeks    Status Achieved    Target Date 09/08/21      OT LONG TERM GOAL #4   Title Pt. will demonstrate visual  compensatory strategies for navigating through his environment.    Baseline 30th visit: Pt. continues to require assist to navigate through his environment. 20th: Pt continues to require assist to navigate environment, minimal knowledge of strategies. Eval: Limited 06/23/2021: Pt. continues to be limited, and requires assist  with navigating through his environment; 08/16/21: father reports pt only steers his own wc in 1 open room of their house, otherwise dep to navigate environment. 09/13/21: limited steering outside of house, steers in 1 room of house. 09/13/21: pt seeing low vision OT specialist to address.    Time 12    Period Weeks    Status Deferred    Target Date 09/08/21      OT LONG TERM GOAL #5   Title Pt. will demonstrate visual compensatory strategies for tabletop tasks within his near space.    Baseline 06/16/2021: Pt. continues to present with visual limitations for tabletop tasks in his near space. Pt. continues to present with limited vision. 21st: Pt can identify objects on tabletop ~waterbottle sized, improves with contrasting colours, cannot ID small onjects; 08/16/21: no change from last assessment period. 09/13/21: pt seeing low vision OT specialist to address.    Time 12    Period Weeks    Status Deferred    Target Date 09/08/21      OT LONG TERM GOAL #6   Title Pt. will perform self feeding with SETUP using adaptive equipment    Baseline 06/23/2021: Pt. is able to use his bilateral hands to perfrom hand to mouth patterns for self-feeding with finger foods. Pt. is now able to use his left hand to bring a water bottle to his mouth,a nd drink from it. 30th visit: Pt. continues to present with weakness limiting functional hand to mouth patterns of movement. Pt. is able to perform hand to mouth patterns, however reports weakness in his hand  limits a secure grasp on the utensil. Pt. requires assist to perform; Eval: Pt. has difficulty; 08/16/21: pt continues to use L hand for self feeding  finger foods with set up.  Spasticity limits dexterity to manipulate eating utensils. 09/13/21: can grip built up handle however requires MIN A 2/2 vision and wrist ROM deficits.    Time 12    Period Weeks    Status Revised    Target Date 12/07/21      OT LONG TERM GOAL #7   Title Pt. will perform one step self-grooming tasks with SETUP    Baseline  06/23/2021: Pt. is able to perform the hand to face pattern with the left hand, however has difficulty actively sustaining his BUEs in elevation long enough to perform self-grooming tasks. 06/16/2021: Pt. is able to perform the hand to face pattern with the left hand, however has difficulty actively sustaining his BUEs in elevation long enough to perform self-grooming tasks; 08/16/21: same as 06/16/21. 09/13/21: limited by trunk and sustaining BUEs    Time 12    Period Weeks    Status Revised    Target Date 12/07/21      OT LONG TERM GOAL #8   Title Pt will complete UB dressing tasks with SETUP and assist to thread over hands only    Baseline 06/23/2021: Pt. requires MinA to thread arms, and requires maxA to donn over upper arms, MaxAx2 to donn over his back. 06/16/2021: Pt. has progressed  to require MinA to thread arms, and requires maxA to donn over upper arms, MaxAx2 to donn over his back; 08/16/21: min A to thread arms, max A to don over upper arms and over head. 09/13/21: MOD A    Time 12    Period Weeks    Status On-going    Target Date 12/07/21      OT LONG TERM GOAL  #9   TITLE Pt will improve B grip strength by 10# to assist in handling ADL items    Baseline 08/16/21: bilat girp 0#; 09/13/21: L grip 6#, R grip 9#    Time 12    Period Weeks    Status Revised    Target Date 12/06/21      OT LONG TERM GOAL  #10   TITLE Pt will improve B lateral pinch strength by 8# to assist in UBD.    Baseline N/A: awaiting pinch meter to be fixed. Pt. unable to formulate proper positioning for lateral pinch assessment of the replacement pinch meter; 08/16/21: R  2#, L 1.5#; ; 09/13/21: R 6#, L 2#    Time 12    Period Weeks    Status Revised    Target Date 12/07/21                   Plan - 09/14/21 1407     Clinical Impression Statement Goals and measurements obtained. Continued discussions on Softpro resting hand splint, order faxed to patients PCP. Goals reviewed and measurements obtained - noted improvements to B grip strength and pinch strength. Near R shoulder flexion ROM goal to functionally assist with UBD, pt's father reports elbow ROM limiting dressing. Pt continues to benefit from OT services to improve ROM, and facilitate active volitional movement in order to work towards engaging his BUEs into ADLs.    OT Occupational Profile and History Detailed Assessment- Review of Records and additional review of physical, cognitive, psychosocial history related to current functional performance    Occupational performance deficits (Please refer to evaluation for details): ADL's;IADL's    Body Structure / Function / Physical Skills ADL;FMC;ROM;Dexterity;IADL;Endurance;Obesity;Strength;UE functional use    Rehab Potential Good    Clinical Decision Making Multiple treatment options, significant modification of task necessary    Comorbidities Affecting Occupational Performance: Presence of comorbidities impacting occupational performance    Modification or Assistance to Complete Evaluation  Max significant modification of tasks or assist is necessary to complete    OT Frequency 2x / week    OT Duration 12 weeks    OT Treatment/Interventions Self-care/ADL training;Therapeutic exercise;Neuromuscular education;Visual/perceptual remediation/compensation;Patient/family education;Therapeutic activities;DME and/or AE instruction;Passive range  of motion;Moist Heat;Electrical Stimulation    Consulted and Agree with Plan of Care Patient             Patient will benefit from skilled therapeutic intervention in order to improve the following deficits  and impairments:   Body Structure / Function / Physical Skills: ADL, FMC, ROM, Dexterity, IADL, Endurance, Obesity, Strength, UE functional use       Visit Diagnosis: History of CVA with residual deficit  Other lack of coordination    Problem List Patient Active Problem List   Diagnosis Date Noted   Sepsis due to vancomycin resistant Enterococcus species (HCC) 06/06/2019   SIRS (systemic inflammatory response syndrome) (HCC) 06/05/2019   Acute lower UTI 06/05/2019   VRE (vancomycin-resistant Enterococci) infection 06/05/2019   Anemia 06/05/2019   Skin ulcer of sacrum with necrosis of muscle (HCC)    Urinary retention    Type 2 diabetes mellitus without complication, with long-term current use of insulin (HCC)    Tachycardia    Lower extremity edema    Acute metabolic encephalopathy    Obstructive sleep apnea    Morbid obesity with BMI of 60.0-69.9, adult (HCC)    Goals of care, counseling/discussion    Palliative care encounter    Sepsis (HCC) 04/27/2019   H/O insulin dependent diabetes mellitus 04/27/2019   History of CVA with residual deficit 04/27/2019   Seizure disorder (HCC) 04/27/2019   Decubitus ulcer of sacral region, stage 4 (HCC) 04/27/2019    Kathie Dike, M.S. OTR/L  09/15/21, 8:17 AM  ascom [phone]   [facility] MAIN [facility] SERVICES [address] [place], [place], [address] Phone: [phone]   Fax:  [phone]  Name: Paul Hall MRN: [id number] Date of Birth: 25-Nov-1995

## 2021-09-16 ENCOUNTER — Ambulatory Visit: Payer: BC Managed Care – PPO | Admitting: Dermatology

## 2021-09-20 ENCOUNTER — Ambulatory Visit: Payer: BC Managed Care – PPO | Admitting: Occupational Therapy

## 2021-09-20 ENCOUNTER — Ambulatory Visit: Payer: BC Managed Care – PPO

## 2021-09-20 DIAGNOSIS — R262 Difficulty in walking, not elsewhere classified: Secondary | ICD-10-CM | POA: Diagnosis not present

## 2021-09-20 DIAGNOSIS — R278 Other lack of coordination: Secondary | ICD-10-CM

## 2021-09-20 DIAGNOSIS — R269 Unspecified abnormalities of gait and mobility: Secondary | ICD-10-CM

## 2021-09-20 DIAGNOSIS — I6930 Unspecified sequelae of cerebral infarction: Secondary | ICD-10-CM | POA: Diagnosis not present

## 2021-09-20 DIAGNOSIS — M6281 Muscle weakness (generalized): Secondary | ICD-10-CM | POA: Diagnosis not present

## 2021-09-20 DIAGNOSIS — R2681 Unsteadiness on feet: Secondary | ICD-10-CM

## 2021-09-20 DIAGNOSIS — R2689 Other abnormalities of gait and mobility: Secondary | ICD-10-CM | POA: Diagnosis not present

## 2021-09-20 NOTE — Therapy (Signed)
[facility] MAIN [facility] SERVICES [address] [place], [place], [address] Phone: [phone]   Fax:  [phone]  Occupational Therapy Treatment  Patient Details  Name: Paul Hall MRN: [id number] Date of Birth: 09-Dec-1995 Referring Provider (OT): Lenise Herald   Encounter Date: 09/20/2021   OT End of Session - 09/20/21 1537     Visit Number 56    Number of Visits 96    Date for OT Re-Evaluation 12/07/21    Authorization Type Progress report period starting 06/28/21    OT Start Time 1600    OT Stop Time 1645    OT Time Calculation (min) 45 min    Equipment Utilized During Treatment tilt in space power wc    Activity Tolerance Patient tolerated treatment well    Behavior During Therapy WFL for tasks assessed/performed             Past Medical History:  Diagnosis Date   Diabetes mellitus (HCC)    Hypertension    Stroke [facility])     Past Surgical History:  Procedure Laterality Date   IVC FILTER PLACEMENT ([facility] HX)     LEG SURGERY     PEG TUBE PLACEMENT     TRACHEOSTOMY      There were no vitals filed for this visit.   Subjective Assessment - 09/20/21 1537     Subjective  Pt reports having a big cook out for fathers day.    Patient is accompanied by: Family member    Pertinent History Pt. is a 26 y.o. male who was hospitalized, and diagnosed with COVID-19, sustained a CVA with resultant quadreplegia in 01/2019. Pt. was hospitalized with VRE  UTI. Pt. PMHx includes: urinary retention, Seizure Disorder, Obstructive Sleep Disorder, DM Type II, and Morbid obesity. Pt. resides at home with his parents.    Limitations BUE, and LE limitations with ROM, strength, motor control, and [facility] skills.    Patient Stated Goals To improve his arms, and hands.    Currently in Pain? No/denies    Pain Onset Today    Pain Onset More than a month ago               Therapeutic Exercise  Pt tolerated 2 set x 8 reps each using 2# wrist  weight for - over head press , elbow flexion/extension, horizontal abduction. Grip strengthener using 1 green band - requires setup placed in hand, tolerates 1 set x 10 reps each hand.  Pt completed 1.5lb digi-flex for finger strengthening, unable to isolate fingers.     Therapeutic Activities Pt worked on reaching for [employer] moving them through first 2 levels of horizontal rungs. Intermittent cues to locate rings using L hand, reaches fully across torso for first and second level. SETUP/MIN A hand over hand assist to place ball/rings in hand to place on lowest level.          OT Education - 09/20/21 1537     Education Details splinting recommendations/benefits of Softpro functional resting hand splint    Person(s) Educated Patient;Parent(s)    Methods Verbal cues;Explanation    Comprehension Verbalized understanding;Verbal cues required;Need further instruction              OT Short Term Goals - 09/13/21 1641       OT SHORT TERM GOAL #1   Title Pt and caregiver will obtain splint and follow wearing schedule consistently 7 days/week    Baseline 09/14/21: working to obtain splint  Time 6    Period Weeks    Status New    Target Date 10/26/21               OT Long Term Goals - 09/13/21 1623       OT LONG TERM GOAL #1   Title Pt. will improve FOTO score by 5 points for clinically significant improvements during ADLs, and IADLs,    Baseline 30th visit: FOTO: 37, 21st: FOTO 43. 10th Visit: FOTO score 42 Eval: FOTO score: 41 FOTO score: 39; 50th: FOTO 52; 09/13/21: FOTO 41    Time 12    Period Weeks    Status On-going    Target Date 12/07/21      OT LONG TERM GOAL #2   Title Pt. will improve right shoulder flexion by 15 degrees to assist caregivers with UE dressing    Baseline Pt. continues to require assist with UE dressing secondary to limited ROM; 08/16/21: Max A-total A UB dressing; R shoulder flexion/abd combo 0-100; 09/13/21: R shoulder flexion/abd combo  0-110, R shoulder hiking noted    Time 12    Period Weeks    Status Revised    Target Date 12/07/21      OT LONG TERM GOAL #3   Title Pt. will improve right Abduction by 10 degrees to assist caregivers with underarm hygiene care.    Baseline Eval: AROM abduction RUE 76. Pt. continues to present with limited bilateral abduction. 21st: AROM abduction RUE 80.06/16/2021: Right shulder abduction 108. 06/23/2021: Right shoulder abduction: 110; 08/16/21: abd 110    Time 12    Period Weeks    Status Achieved    Target Date 09/08/21      OT LONG TERM GOAL #4   Title Pt. will demonstrate visual compensatory strategies for navigating through his environment.    Baseline 30th visit: Pt. continues to require assist to navigate through his environment. 20th: Pt continues to require assist to navigate environment, minimal knowledge of strategies. Eval: Limited 06/23/2021: Pt. continues to be limited, and requires assist  with navigating through his environment; 08/16/21: father reports pt only steers his own wc in 1 open room of their house, otherwise dep to navigate environment. 09/13/21: limited steering outside of house, steers in 1 room of house. 09/13/21: pt seeing low vision OT specialist to address.    Time 12    Period Weeks    Status Deferred    Target Date 09/08/21      OT LONG TERM GOAL #5   Title Pt. will demonstrate visual compensatory strategies for tabletop tasks within his near space.    Baseline 06/16/2021: Pt. continues to present with visual limitations for tabletop tasks in his near space. Pt. continues to present with limited vision. 21st: Pt can identify objects on tabletop ~waterbottle sized, improves with contrasting colours, cannot ID small onjects; 08/16/21: no change from last assessment period. 09/13/21: pt seeing low vision OT specialist to address.    Time 12    Period Weeks    Status Deferred    Target Date 09/08/21      OT LONG TERM GOAL #6   Title Pt. will perform self feeding  with SETUP using adaptive equipment    Baseline 06/23/2021: Pt. is able to use his bilateral hands to perfrom hand to mouth patterns for self-feeding with finger foods. Pt. is now able to use his left hand to bring a water bottle to his mouth,a nd drink from it. 30th visit: Pt.  continues to present with weakness limiting functional hand to mouth patterns of movement. Pt. is able to perform hand to mouth patterns, however reports weakness in his hand  limits a secure grasp on the utensil. Pt. requires assist to perform; Eval: Pt. has difficulty; 08/16/21: pt continues to use L hand for self feeding finger foods with set up.  Spasticity limits dexterity to manipulate eating utensils. 09/13/21: can grip built up handle however requires MIN A 2/2 vision and wrist ROM deficits.    Time 12    Period Weeks    Status Revised    Target Date 12/07/21      OT LONG TERM GOAL #7   Title Pt. will perform one step self-grooming tasks with SETUP    Baseline 06/23/2021: Pt. is able to perform the hand to face pattern with the left hand, however has difficulty actively sustaining his BUEs in elevation long enough to perform self-grooming tasks. 06/16/2021: Pt. is able to perform the hand to face pattern with the left hand, however has difficulty actively sustaining his BUEs in elevation long enough to perform self-grooming tasks; 08/16/21: same as 06/16/21. 09/13/21: limited by trunk and sustaining BUEs    Time 12    Period Weeks    Status Revised    Target Date 12/07/21      OT LONG TERM GOAL #8   Title Pt will complete UB dressing tasks with SETUP and assist to thread over hands only    Baseline 06/23/2021: Pt. requires MinA to thread arms, and requires maxA to donn over upper arms, MaxAx2 to donn over his back. 06/16/2021: Pt. has progressed  to require MinA to thread arms, and requires maxA to donn over upper arms, MaxAx2 to donn over his back; 08/16/21: min A to thread arms, max A to don over upper arms and over head.  09/13/21: MOD A    Time 12    Period Weeks    Status On-going    Target Date 12/07/21      OT LONG TERM GOAL  #9   TITLE Pt will improve B grip strength by 10# to assist in handling ADL items    Baseline 08/16/21: bilat girp 0#; 09/13/21: L grip 6#, R grip 9#    Time 12    Period Weeks    Status Revised    Target Date 12/06/21      OT LONG TERM GOAL  #10   TITLE Pt will improve B lateral pinch strength by 8# to assist in UBD.    Baseline N/A: awaiting pinch meter to be fixed. Pt. unable to formulate proper positioning for lateral pinch assessment of the replacement pinch meter; 08/16/21: R 2#, L 1.5#; ; 09/13/21: R 6#, L 2#    Time 12    Period Weeks    Status Revised    Target Date 12/07/21                   Plan - 09/20/21 1537     Clinical Impression Statement Significantly improved wokring with first 2 levels of [place] with intermittent cues to locate balls. Intermittent SETUP-MIN A to place balls in hand to place on 1st level. Pt continues to benefit from OT services to improve ROM, and facilitate active volitional movement in order to work towards engaging his BUEs into ADLs.    OT Occupational Profile and History Detailed Assessment- Review of Records and additional review of physical, cognitive, psychosocial history related to current functional performance  Occupational performance deficits (Please refer to evaluation for details): ADL's;IADL's    Body Structure / Function / Physical Skills ADL;FMC;ROM;Dexterity;IADL;Endurance;Obesity;Strength;UE functional use    Rehab Potential Good    Clinical Decision Making Multiple treatment options, significant modification of task necessary    Comorbidities Affecting Occupational Performance: Presence of comorbidities impacting occupational performance    Modification or Assistance to Complete Evaluation  Max significant modification of tasks or assist is necessary to complete    OT Frequency 2x / week    OT Duration 12 weeks     OT Treatment/Interventions Self-care/ADL training;Therapeutic exercise;Neuromuscular education;Visual/perceptual remediation/compensation;Patient/family education;Therapeutic activities;DME and/or AE instruction;Passive range of motion;Moist Heat;Electrical Stimulation    Consulted and Agree with Plan of Care Patient             Patient will benefit from skilled therapeutic intervention in order to improve the following deficits and impairments:   Body Structure / Function / Physical Skills: ADL, FMC, ROM, Dexterity, IADL, Endurance, Obesity, Strength, UE functional use       Visit Diagnosis: Muscle weakness (generalized)  Other lack of coordination  History of CVA with residual deficit    Problem List Patient Active Problem List   Diagnosis Date Noted   Sepsis due to vancomycin resistant Enterococcus species (HCC) 06/06/2019   SIRS (systemic inflammatory response syndrome) (HCC) 06/05/2019   Acute lower UTI 06/05/2019   VRE (vancomycin-resistant Enterococci) infection 06/05/2019   Anemia 06/05/2019   Skin ulcer of sacrum with necrosis of muscle (HCC)    Urinary retention    Type 2 diabetes mellitus without complication, with long-term current use of insulin (HCC)    Tachycardia    Lower extremity edema    Acute metabolic encephalopathy    Obstructive sleep apnea    Morbid obesity with BMI of 60.0-69.9, adult (HCC)    Goals of care, counseling/discussion    Palliative care encounter    Sepsis (HCC) 04/27/2019   H/O insulin dependent diabetes mellitus 04/27/2019   History of CVA with residual deficit 04/27/2019   Seizure disorder (HCC) 04/27/2019   Decubitus ulcer of sacral region, stage 4 (HCC) 04/27/2019   Kathie Dike, M.S. OTR/L  09/20/21, 4:50 PM  ascom [phone]   [place] [facility] MAIN [facility] SERVICES [address] [place], [place], [address] Phone: [phone]   Fax:  [phone]  Name: Paul Hall MRN:  [id number] Date of Birth: 15-Sep-1995

## 2021-09-20 NOTE — Therapy (Unsigned)
[place] MAIN [facility] SERVICES [address] [place], [place], [address] Phone: [phone]   Fax:  [phone]  Physical Therapy Treatment  Patient Details  Name: Paul Hall MRN: [id number] Date of Birth: September 24, 1995 Referring Provider (PT): Collene Mares, PA-C   Encounter Date: 09/20/2021    Past Medical History:  Diagnosis Date   Diabetes mellitus ([place])    Hypertension    Stroke [facility])     Past Surgical History:  Procedure Laterality Date   IVC FILTER PLACEMENT ([facility] HX)     LEG SURGERY     PEG TUBE PLACEMENT     TRACHEOSTOMY      There were no vitals filed for this visit.     interventions                             PT Short Term Goals - 05/10/21 1413       PT SHORT TERM GOAL #1   Title Pt will be independent with HEP in order to improve strength and balance in order to decrease fall risk and improve function at home and work.    Baseline 01/04/2021= No formal HEP in place; 12/12 no HEP in place; 05/10/2021-Patient and his father were able to report compliance with curent HEP consisting of mostly seated/reclined LE strengthening. Both verbalize no questions at this time.    Time 6    Period Weeks    Status Achieved    Target Date 02/15/21               PT Long Term Goals - 09/15/21 1525       PT LONG TERM GOAL #1   Title Patient will increase BLE gross strength by 1/2 muscle grade to improve functional strength for improved independence with potential gait, increased standing tolerance and increased ADL ability.    Baseline 01/04/2021- Patient presents with 1/5 to 3-/5 B LE strength with MMT; 12/12: goal partially met for Left knee/hip; 05/10/2021= 2-/5 bilateal Hip flex; 3+/5 bilateral Knee ext; 06/21/2021= Patient presents with 2-/5 bilateral Hip flex; 3+/5 bilateral knee ext/flex; 2-/5 left ankle DF; 0/5 right ankle- and able to increase reps and resistance with LE's. 09/15/2021-  Patient technically presents with 2-/5 B hip flex/abd/add - but he is able to raise his hip up to approx 100 deg which has improved. 3+/5 Bilateral knee ext, 2-/5 left ankle and 0/5 right ankle.    Time 12    Period Weeks    Status On-going    Target Date 12/08/21      PT LONG TERM GOAL #2   Title Patient will tolerate sitting unsupported demonstrating erect sitting posture for 15 minutes with CGA to demonstrate improved back extensor strength and improved sitting tolerance.    Baseline 01/04/2021- Patient confied to sitting in lift chair or electric power chair with back support and unable to sit upright without physical assistance; 12/12: tolerates <1 minutes upright unsupported sitting. 05/10/2021=static sit with forward trunk lean  in his power wheelchair without back support x approx 3 min. 06/21/2021=Unable to assess today due to patient with acute back pain but on previous visit able to sit x 8 min without back support. 09/15/2021- on last visit- 09/13/2021- patient was able to sit unsupported x 8 min at edge of mat.    Time 12    Period Weeks    Status On-going    Target Date 12/08/21  PT LONG TERM GOAL #3   Title Patient will demonstrate ability to perform static standing in // bars > 2 min with Max Assist  without loss of balance and fair posture for improved overall strength for pre-gait and transfer activities.    Baseline 01/04/2021= Patient current uanble to stand- Dependent on hoyer or sit to stand lift for transfers. 05/10/2021=Not appropriate yet- Currently still dependent with all transfers using hoyer. 06/21/2021= Patient continuing now to focus on LE strengthening to prepare for standing-unable to try today due to acute low back pain-  planning on attempting in new cert period. 09/15/2021- Patient has attempted standing 2x in past two week- max Assist of 2 people - only once was he successful to clearing his bottom from chair - Will continue to be a focus during the new certification.     Time 12    Period Weeks    Status On-going    Target Date 12/08/21      PT LONG TERM GOAL #4   Title Pt will improve FOTO score by 10 points or more demonstrating improved perceived functional ability    Baseline FOTO 7 on 10/17; 03/15/21: FOTO 12; 05/10/2021 06/21/2021= 1; 09/15/2021= 9    Time 12    Period Weeks    Status On-going    Target Date 12/08/21      PT LONG TERM GOAL #5   Title Patient will perform sit to stand transfer with appropriate AD and max assist of 2 people with 75% consistency to prepare for pregait activities.    Baseline 09/15/2021= Patient unable to stand well- unable to clear his bottom off chair with Max assist of 2 persons.    Time 12    Period Weeks    Status New    Target Date 12/08/21                    Patient will benefit from skilled therapeutic intervention in order to improve the following deficits and impairments:     Visit Diagnosis: No diagnosis found.     Problem List Patient Active Problem List   Diagnosis Date Noted   Sepsis due to vancomycin resistant Enterococcus species ([place]) 06/06/2019   SIRS (systemic inflammatory response syndrome) ([place]) 06/05/2019   Acute lower UTI 06/05/2019   VRE (vancomycin-resistant Enterococci) infection 06/05/2019   Anemia 06/05/2019   Skin ulcer of sacrum with necrosis of muscle ([place])    Urinary retention    Type 2 diabetes mellitus without complication, with long-term current use of insulin (HCC)    Tachycardia    Lower extremity edema    Acute metabolic encephalopathy    Obstructive sleep apnea    Morbid obesity with BMI of 60.0-69.9, adult ([place])    Goals of care, counseling/discussion    Palliative care encounter    Sepsis ([place]) 04/27/2019   H/O insulin dependent diabetes mellitus 04/27/2019   History of CVA with residual deficit 04/27/2019   Seizure disorder ([place]) 04/27/2019   Decubitus ulcer of sacral region, stage 4 ([place]) 04/27/2019    Lewis Moccasin, PT 09/20/2021,  3:48 PM  [place] MAIN [facility] SERVICES [address] [place], [place], [address] Phone: [phone]   Fax:  [phone]  Name: Paul Hall MRN: [id number] Date of Birth: 06/17/1995

## 2021-09-22 ENCOUNTER — Ambulatory Visit: Payer: BC Managed Care – PPO | Admitting: Occupational Therapy

## 2021-09-22 ENCOUNTER — Ambulatory Visit: Payer: BC Managed Care – PPO

## 2021-09-22 DIAGNOSIS — R269 Unspecified abnormalities of gait and mobility: Secondary | ICD-10-CM

## 2021-09-22 DIAGNOSIS — R2689 Other abnormalities of gait and mobility: Secondary | ICD-10-CM | POA: Diagnosis not present

## 2021-09-22 DIAGNOSIS — R2681 Unsteadiness on feet: Secondary | ICD-10-CM

## 2021-09-22 DIAGNOSIS — R278 Other lack of coordination: Secondary | ICD-10-CM

## 2021-09-22 DIAGNOSIS — I6389 Other cerebral infarction: Secondary | ICD-10-CM | POA: Diagnosis not present

## 2021-09-22 DIAGNOSIS — M6281 Muscle weakness (generalized): Secondary | ICD-10-CM

## 2021-09-22 DIAGNOSIS — I6930 Unspecified sequelae of cerebral infarction: Secondary | ICD-10-CM

## 2021-09-22 DIAGNOSIS — G4733 Obstructive sleep apnea (adult) (pediatric): Secondary | ICD-10-CM | POA: Diagnosis not present

## 2021-09-22 DIAGNOSIS — L8994 Pressure ulcer of unspecified site, stage 4: Secondary | ICD-10-CM | POA: Diagnosis not present

## 2021-09-22 DIAGNOSIS — R262 Difficulty in walking, not elsewhere classified: Secondary | ICD-10-CM | POA: Diagnosis not present

## 2021-09-22 NOTE — Addendum Note (Signed)
Addended by: Lenda Kelp on: 09/22/2021 05:04 PM   Modules accepted: Orders

## 2021-09-22 NOTE — Therapy (Signed)
OUTPATIENT PHYSICAL THERAPY TREATMENT NOTE   Patient Name: Paul Hall MRN: [id number] DOB:[birth year], 26 y.o., male Today's Date: 09/23/2021  PCP:  Loistine Chance PROVIDER:  Collene Mares, PA-C   PT End of Session - 09/22/21 1656     Visit Number 47    Number of Visits 61    Date for PT Re-Evaluation 12/08/21    Authorization Type [employer] and [employer]- Need auth past 12th visit    Authorization Time Period 01/04/21-03/29/21; Recert 03/00/9233-0/10/6224    Progress Note Due on Visit 45    PT Start Time 1515    PT Stop Time 1552    PT Time Calculation (min) 37 min    Equipment Utilized During Treatment Gait belt   Hoyer lift   Activity Tolerance Patient tolerated treatment well   queasiness/nausea   Behavior During Therapy WFL for tasks assessed/performed             Past Medical History:  Diagnosis Date   Diabetes mellitus (Paul Hall)    Hypertension    Stroke Paul Hall)    Past Surgical History:  Procedure Laterality Date   IVC FILTER PLACEMENT (Paul Hall)     LEG SURGERY     PEG TUBE PLACEMENT     TRACHEOSTOMY     Patient Active Problem List   Diagnosis Date Noted   Sepsis due to vancomycin resistant Enterococcus species (Paul Hall) 06/06/2019   SIRS (systemic inflammatory response syndrome) (Paul Hall) 06/05/2019   Acute lower UTI 06/05/2019   VRE (vancomycin-resistant Enterococci) infection 06/05/2019   Anemia 06/05/2019   Skin ulcer of sacrum with necrosis of muscle (Paul Hall)    Urinary retention    Type 2 diabetes mellitus without complication, with long-term current use of insulin (H[facility])    Tachycardia    Lower extremity edema    Acute metabolic encephalopathy    Obstructive sleep apnea    Morbid obesity with BMI of 60.0-69.9, adult (Paul Hall)    Goals of care, counseling/discussion    Palliative care encounter    Sepsis (Paul Hall) 04/27/2019   H/O insulin dependent diabetes mellitus 04/27/2019   History of CVA with residual deficit 04/27/2019   Seizure disorder (Paul Hall)  04/27/2019   Decubitus ulcer of sacral region, stage 4 (Paul Hall) 04/27/2019    REFERRING DIAG: Cerebral infarction, unspecified   THERAPY DIAG:  Muscle weakness (generalized)  Other lack of coordination  History of CVA with residual deficit  Difficulty in walking, not elsewhere classified  Unsteadiness on feet  Abnormality of gait and mobility  Other abnormalities of gait and mobility  Rationale for Evaluation and Treatment Rehabilitation  PERTINENT HISTORY: Paul Hall is a 25yoM who presents with severe weakness, quadriparesis, altered sensorium, and visual impairment s/p critical illness and prolonged hospitalization. Pt hospitalized in October 2020 with ARDS 2/2 COVID19 infection. Pt sustained a complex and lengthy hospitalization which included tracheostomy, prolonged sedation, ECMO. In this period pt sustained CVA and SDH. Pt has now been liberated from tracheostomy and G-tube. Pt has since been hospitalized for wound infection and UTI. Pt lives with parents at home, has hospital bed and left chair, hoyer lift transfers, and power WC for mobility needs. Pt needs heavy physical assistance with ADL 2/2 BUE contractures and motor dysfunction   PRECAUTIONS: Fall  SUBJECTIVE: Patient reports doing well overall today and states he is ready to try to stand if he can today.   PAIN:  Are you having pain? No     TODAY'S TREATMENT:  09/22/2021  Therapeutic Activities:  -  Dependent hoyer lift from w/c to mat table with LE hanging off edge-  - Supine to sit at edge of bed- Max A - Sitting at edge of mat x 7 min with supervision and improved ability to dynamic reach with B UE - Patient attempted LAQ from edge of mat- able to extend left knee to clear foot today- unable to extend right side.  - Attempted sit to stand x multiple attempts x 4. Patient required max assist of 2 persons to try to stand- patient was abel to lean forward - push through LE and unclear if he was able to clear  his bottom off mat table on any of attempts.   Education provided throughout session via VC/TC and demonstration to facilitate movement at target joints and correct muscle activation for all testing and exercises performed.     PATIENT EDUCATION: Education details: posture cues with sitting Person educated: Patient Education method: Explanation, Demonstration, Tactile cues, and Verbal cues Education comprehension: verbalized understanding, returned demonstration, verbal cues required, tactile cues required, and needs further education   HOME EXERCISE PROGRAM:    PT Short Term Goals -       PT SHORT TERM GOAL #1   Title Pt will be independent with HEP in order to improve strength and balance in order to decrease fall risk and improve function at home and work.    Baseline 01/04/2021= No formal HEP in Hall; 12/12 no HEP in Hall; 05/10/2021-Patient and his father were able to report compliance with curent HEP consisting of mostly seated/reclined LE strengthening. Both verbalize no questions at this time.    Time 6    Period Weeks    Status Achieved    Target Date 02/15/21              PT Long Term Goals -      PT LONG TERM GOAL #1   Title Patient will increase BLE gross strength by 1/2 muscle grade to improve functional strength for improved independence with potential gait, increased standing tolerance and increased ADL ability.    Baseline 01/04/2021- Patient presents with 1/5 to 3-/5 B LE strength with MMT; 12/12: goal partially met for Left knee/hip; 05/10/2021= 2-/5 bilateal Hip flex; 3+/5 bilateral Knee ext; 06/21/2021= Patient presents with 2-/5 bilateral Hip flex; 3+/5 bilateral knee ext/flex; 2-/5 left ankle DF; 0/5 right ankle- and able to increase reps and resistance with LE's. 09/15/2021- Patient technically presents with 2-/5 B hip flex/abd/add - but he is able to raise his hip up to approx 100 deg which has improved. 3+/5 Bilateral knee ext, 2-/5 left ankle and 0/5 right  ankle.    Time 12    Period Weeks    Status On-going    Target Date 12/08/21      PT LONG TERM GOAL #2   Title Patient will tolerate sitting unsupported demonstrating erect sitting posture for 15 minutes with CGA to demonstrate improved back extensor strength and improved sitting tolerance.    Baseline 01/04/2021- Patient confied to sitting in lift chair or electric power chair with back support and unable to sit upright without physical assistance; 12/12: tolerates <1 minutes upright unsupported sitting. 05/10/2021=static sit with forward trunk lean  in his power wheelchair without back support x approx 3 min. 06/21/2021=Unable to assess today due to patient with acute back pain but on previous visit able to sit x 8 min without back support. 09/15/2021- on last visit- 09/13/2021- patient was able to sit unsupported x 8 min  at edge of mat.    Time 12    Period Weeks    Status On-going    Target Date 12/08/21      PT LONG TERM GOAL #3   Title Patient will demonstrate ability to perform static standing in // bars > 2 min with Max Assist  without loss of balance and fair posture for improved overall strength for pre-gait and transfer activities.    Baseline 01/04/2021= Patient current uanble to stand- Dependent on hoyer or sit to stand lift for transfers. 05/10/2021=Not appropriate yet- Currently still dependent with all transfers using hoyer. 06/21/2021= Patient continuing now to focus on LE strengthening to prepare for standing-unable to try today due to acute low back pain-  planning on attempting in new cert period. 09/15/2021- Patient has attempted standing 2x in past two week- max Assist of 2 people - only once was he successful to clearing his bottom from chair - Will continue to be a focus during the new certification.    Time 12    Period Weeks    Status On-going    Target Date 12/08/21      PT LONG TERM GOAL #4   Title Pt will improve FOTO score by 10 points or more demonstrating improved  perceived functional ability    Baseline FOTO 7 on 10/17; 03/15/21: FOTO 12; 05/10/2021 06/21/2021= 1; 09/15/2021= 9    Time 12    Period Weeks    Status On-going    Target Date 12/08/21      PT LONG TERM GOAL #5   Title Patient will perform sit to stand transfer with appropriate AD and max assist of 2 people with 75% consistency to prepare for pregait activities.    Baseline 09/15/2021= Patient unable to stand well- unable to clear his bottom off chair with Max assist of 2 persons.    Time 12    Period Weeks    Status New    Target Date 12/08/21              Plan - 09/22/21 1659     Clinical Impression Statement Patient presents with great motivation and treatment focused on sitting at edge of mat and even attempted standing multiple attempts. Patient demo much improved independence with all sitting. He also participated well with standing attempts with great effort but max difficulty despite max A +2 today. He was push to push through his foreams and some through BLE yet unable to stand erect today. Patient would benefit from additional skilled PT intervention to improve strength, balance and mobility for improved quality of life.    Personal Factors and Comorbidities Comorbidity 3+;Time since onset of injury/illness/exacerbation    Comorbidities CVA, diabetes, Seizures    Examination-Activity Limitations Bathing;Bed Mobility;Bend;Caring for Others;Carry;Dressing;Hygiene/Grooming;Lift;Locomotion Level;Reach Overhead;Self Feeding;Sit;Squat;Stairs;Stand;Transfers;Toileting    Examination-Participation Restrictions Cleaning;Community Activity;Driving;Laundry;Medication Management;Meal Prep;Occupation;Personal Finances;Shop;Yard Work;Volunteer    Stability/Clinical Decision Making Evolving/Moderate complexity    Rehab Potential Fair    PT Frequency 2x / week    PT Duration 12 weeks    PT Treatment/Interventions ADLs/Self Care Home Management;Cryotherapy;Electrical Stimulation;Moist  Heat;Ultrasound;DME Instruction;Gait training;Stair training;Functional mobility training;Therapeutic exercise;Balance training;Patient/family education;Orthotic Fit/Training;Neuromuscular re-education;Wheelchair mobility training;Manual techniques;Passive range of motion;Dry needling;Energy conservation;Taping;Visual/perceptual remediation/compensation;Joint Manipulations    PT Next Visit Plan core strength- sitting at EOM, postural control, sabina sit to stand if able; Continue with progressive LE Strengthening    PT Home Exercise Plan No changes to HEP today    Consulted and Agree with Plan of Care Patient;Family member/caregiver  Family Member Consulted Dad               Lewis Moccasin, PT 09/23/2021, 9:16 PM

## 2021-09-23 ENCOUNTER — Encounter: Payer: Self-pay | Admitting: Occupational Therapy

## 2021-09-23 DIAGNOSIS — L0232 Furuncle of buttock: Secondary | ICD-10-CM | POA: Diagnosis not present

## 2021-09-23 DIAGNOSIS — A4902 Methicillin resistant Staphylococcus aureus infection, unspecified site: Secondary | ICD-10-CM | POA: Diagnosis not present

## 2021-09-24 DIAGNOSIS — I6389 Other cerebral infarction: Secondary | ICD-10-CM | POA: Diagnosis not present

## 2021-09-24 DIAGNOSIS — L8994 Pressure ulcer of unspecified site, stage 4: Secondary | ICD-10-CM | POA: Diagnosis not present

## 2021-09-24 DIAGNOSIS — R32 Unspecified urinary incontinence: Secondary | ICD-10-CM | POA: Diagnosis not present

## 2021-09-25 DIAGNOSIS — G4733 Obstructive sleep apnea (adult) (pediatric): Secondary | ICD-10-CM | POA: Diagnosis not present

## 2021-09-27 ENCOUNTER — Encounter: Payer: Self-pay | Admitting: Occupational Therapy

## 2021-09-27 ENCOUNTER — Ambulatory Visit: Payer: BC Managed Care – PPO

## 2021-09-27 ENCOUNTER — Ambulatory Visit: Payer: BC Managed Care – PPO | Admitting: Occupational Therapy

## 2021-09-27 DIAGNOSIS — I6930 Unspecified sequelae of cerebral infarction: Secondary | ICD-10-CM | POA: Diagnosis not present

## 2021-09-27 DIAGNOSIS — R278 Other lack of coordination: Secondary | ICD-10-CM | POA: Diagnosis not present

## 2021-09-27 DIAGNOSIS — R2689 Other abnormalities of gait and mobility: Secondary | ICD-10-CM | POA: Diagnosis not present

## 2021-09-27 DIAGNOSIS — R2681 Unsteadiness on feet: Secondary | ICD-10-CM | POA: Diagnosis not present

## 2021-09-27 DIAGNOSIS — M6281 Muscle weakness (generalized): Secondary | ICD-10-CM

## 2021-09-27 DIAGNOSIS — R269 Unspecified abnormalities of gait and mobility: Secondary | ICD-10-CM | POA: Diagnosis not present

## 2021-09-27 DIAGNOSIS — R262 Difficulty in walking, not elsewhere classified: Secondary | ICD-10-CM | POA: Diagnosis not present

## 2021-09-27 NOTE — Therapy (Addendum)
OUTPATIENT OCCUPATIONAL THERAPY TREATMENT NOTE   Patient Name: Paul Hall MRN: [id number] DOB:05-Sep-1995, 26 y.o., male Today's Date: 09/27/2021  REFERRING PROVIDER:  Lenise Herald, MD .[email or web address]   OT End of Session - 09/27/21 1746        Visit number                                                                58      Number of visits                                                          96      Date of Revaluation                                                    12/07/2021  OT Start Time 1600    OT Stop Time 1645    OT Time Calculation (min) 45 min    Activity Tolerance Patient tolerated treatment well    Behavior During Therapy [facility] for tasks assessed/performed         .    Past Medical History:  Diagnosis Date   Diabetes mellitus ([facility])    Hypertension    Stroke [facility])    Past Surgical History:  Procedure Laterality Date   IVC FILTER PLACEMENT ([facility])     LEG SURGERY     PEG TUBE PLACEMENT     TRACHEOSTOMY     Patient Active Problem List   Diagnosis Date Noted   Sepsis due to vancomycin resistant Enterococcus species (HCC) 06/06/2019   SIRS (systemic inflammatory response syndrome) ([facility]CC) 06/05/2019   Acute lower UTI 06/05/2019   VRE (vancomycin-resistant Enterococci) infection 06/05/2019   Anemia 06/05/2019   Skin ulcer of sacrum with necrosis of muscle (HCC)    Urinary retention    Type 2 diabetes mellitus without complication, with long-term current use of insulin ([facility]CC)    Tachycardia    Lower extremity edema    Acute metabolic encephalopathy    Obstructive sleep apnea    Morbid obesity with BMI of 60.0-69.9, adult ([facility]CC)    Goals of care, counseling/discussion    Palliative care encounter    Sepsis (HCC) 04/27/2019   H/O insulin dependent diabetes mellitus 04/27/2019   History of CVA with residual deficit 04/27/2019   Seizure disorder (HCC) 04/27/2019   Decubitus ulcer of sacral region, stage 4 ([facility]CC) 04/27/2019       REFERRING DIAG: CVA, COVID-19  THERAPY DIAG:  Muscle weakness (generalized)  Rationale for Evaluation and Treatment Rehabilitation  PERTINENT HISTORY: Pt. is a 26 y.o. male who was hospitalized, and diagnosed with COVID-19, sustained a CVA with resultant quadreplegia in 01/2019. Pt. was hospitalized with VRE  UTI. Pt. PMHx includes: urinary retention, Seizure Disorder, Obstructive Sleep Disorder, DM Type II, and Morbid obesity. Pt. resides at home with his parents  PRECAUTIONS: No  SUBJECTIVE: Pt. Reports improved pain since  having Botox  PAIN:  Are you having pain? No     OBJECTIVE:   TODAY'S TREATMENT:    Estim attended:   Pt. tolerated neuromuscular estim  for cycle time 10/10,  duty cycle 50%. Applied to the bilateral wrist, and digit extensors for 5 min. Each with the right intensity set to 38, and the left intensity set to 42, and constant monitoring 100% of the time. Manual assist was required for wrist and digit extension with each rep. To hold the position.    Therapeutic Exercise:   Pt. tolerated bilateral wrist flexion, extension, and MP, PIP, and DIP flexion, and extension without reports of pain this afternoon. Patient tolerated moist heat modality for 5 min. to his bilateral hands prior to range of motion.    Pt. continues to present with bilateral wrist, and digit flexor tightness, however is less than during Botox. Pt. Tolerated ROM, and EStim  well in the digits today. Pt. continues to benefit from OT services  to improve ROM, and facilitate active volitional movement in order to work towards engaging his BUEs in daily ADLs, and IADL tasks.              PATIENT EDUCATION: Education details: BUE functioning, ROM Person educated: Patient and Caregiver Education method: Explanation, Demonstration, and Verbal cues Education comprehension: verbalized understanding, returned demonstration, verbal cues required, and needs further education   HOME  EXERCISE PROGRAM   BUE, hand, and digit ROM   OT Short Term Goals - 09/13/21 1641       OT SHORT TERM GOAL #1   Title Pt and caregiver will obtain splint and follow wearing schedule consistently 7 days/week    Baseline 09/14/21: working to obtain splint    Time 6    Period Weeks    Status New    Target Date 10/26/21              OT Long Term Goals - 09/13/21 1623       OT LONG TERM GOAL #1   Title Pt. will improve FOTO score by 5 points for clinically significant improvements during ADLs, and IADLs,    Baseline 30th visit: FOTO: 37, 21st: FOTO 43. 10th Visit: FOTO score 42 Eval: FOTO score: 41 FOTO score: 39; 50th: FOTO 52; 09/13/21: FOTO 41    Time 12    Period Weeks    Status On-going    Target Date 12/07/21      OT LONG TERM GOAL #2   Title Pt. will improve right shoulder flexion by 15 degrees to assist caregivers with UE dressing    Baseline Pt. continues to require assist with UE dressing secondary to limited ROM; 08/16/21: Max A-total A UB dressing; R shoulder flexion/abd combo 0-100; 09/13/21: R shoulder flexion/abd combo 0-110, R shoulder hiking noted    Time 12    Period Weeks    Status Revised    Target Date 12/07/21      OT LONG TERM GOAL #3   Title Pt. will improve right Abduction by 10 degrees to assist caregivers with underarm hygiene care.    Baseline Eval: AROM abduction RUE 76. Pt. continues to present with limited bilateral abduction. 21st: AROM abduction RUE 80.06/16/2021: Right shulder abduction 108. 06/23/2021: Right shoulder abduction: 110; 08/16/21: abd 110    Time 12    Period Weeks    Status Achieved    Target Date 09/08/21      OT LONG TERM GOAL #4   Title Pt.  will demonstrate visual compensatory strategies for navigating through his environment.    Baseline 30th visit: Pt. continues to require assist to navigate through his environment. 20th: Pt continues to require assist to navigate environment, minimal knowledge of strategies. Eval: Limited  06/23/2021: Pt. continues to be limited, and requires assist  with navigating through his environment; 08/16/21: father reports pt only steers his own wc in 1 open room of their house, otherwise dep to navigate environment. 09/13/21: limited steering outside of house, steers in 1 room of house. 09/13/21: pt seeing low vision OT specialist to address.    Time 12    Period Weeks    Status Deferred    Target Date 09/08/21      OT LONG TERM GOAL #5   Title Pt. will demonstrate visual compensatory strategies for tabletop tasks within his near space.    Baseline 06/16/2021: Pt. continues to present with visual limitations for tabletop tasks in his near space. Pt. continues to present with limited vision. 21st: Pt can identify objects on tabletop ~waterbottle sized, improves with contrasting colours, cannot ID small onjects; 08/16/21: no change from last assessment period. 09/13/21: pt seeing low vision OT specialist to address.    Time 12    Period Weeks    Status Deferred    Target Date 09/08/21      OT LONG TERM GOAL #6   Title Pt. will perform self feeding with SETUP using adaptive equipment    Baseline 06/23/2021: Pt. is able to use his bilateral hands to perfrom hand to mouth patterns for self-feeding with finger foods. Pt. is now able to use his left hand to bring a water bottle to his mouth,a nd drink from it. 30th visit: Pt. continues to present with weakness limiting functional hand to mouth patterns of movement. Pt. is able to perform hand to mouth patterns, however reports weakness in his hand  limits a secure grasp on the utensil. Pt. requires assist to perform; Eval: Pt. has difficulty; 08/16/21: pt continues to use L hand for self feeding finger foods with set up.  Spasticity limits dexterity to manipulate eating utensils. 09/13/21: can grip built up handle however requires MIN A 2/2 vision and wrist ROM deficits.    Time 12    Period Weeks    Status Revised    Target Date 12/07/21      OT LONG  TERM GOAL #7   Title Pt. will perform one step self-grooming tasks with SETUP    Baseline 06/23/2021: Pt. is able to perform the hand to face pattern with the left hand, however has difficulty actively sustaining his BUEs in elevation long enough to perform self-grooming tasks. 06/16/2021: Pt. is able to perform the hand to face pattern with the left hand, however has difficulty actively sustaining his BUEs in elevation long enough to perform self-grooming tasks; 08/16/21: same as 06/16/21. 09/13/21: limited by trunk and sustaining BUEs    Time 12    Period Weeks    Status Revised    Target Date 12/07/21      OT LONG TERM GOAL #8   Title Pt will complete UB dressing tasks with SETUP and assist to thread over hands only    Baseline 06/23/2021: Pt. requires MinA to thread arms, and requires maxA to donn over upper arms, MaxAx2 to donn over his back. 06/16/2021: Pt. has progressed  to require MinA to thread arms, and requires maxA to donn over upper arms, MaxAx2 to donn over his back; 08/16/21:  min A to thread arms, max A to don over upper arms and over head. 09/13/21: MOD A    Time 12    Period Weeks    Status On-going    Target Date 12/07/21      OT LONG TERM GOAL  #9   TITLE Pt will improve B grip strength by 10# to assist in handling ADL items    Baseline 08/16/21: bilat girp 0#; 09/13/21: L grip 6#, R grip 9#    Time 12    Period Weeks    Status Revised    Target Date 12/06/21      OT LONG TERM GOAL  #10   TITLE Pt will improve B lateral pinch strength by 8# to assist in UBD.    Baseline N/A: awaiting pinch meter to be fixed. Pt. unable to formulate proper positioning for lateral pinch assessment of the replacement pinch meter; 08/16/21: R 2#, L 1.5#; ; 09/13/21: R 6#, L 2#    Time 12    Period Weeks    Status Revised    Target Date 12/07/21              Plan - 09/23/21 1416     Clinical Impression Statement Pt. continues to present with bilateral wrist, and digit flexor tightness,  however is less than during Botox. Pt. Tolerated ROM, and EStim  well in the digits today. Pt. continues to benefit from OT services  to improve ROM, and facilitate active volitional movement in order to work towards engaging his BUEs in daily ADLs, and IADL tasks.            OT Occupational Profile and History Detailed Assessment- Review of Records and additional review of physical, cognitive, psychosocial history related to current functional performance    Occupational performance deficits (Please refer to evaluation for details): ADL's;IADL's    Body Structure / Function / Physical Skills ADL;FMC;ROM;Dexterity;IADL;Endurance;Obesity;Strength;UE functional use    Rehab Potential Good    Clinical Decision Making Multiple treatment options, significant modification of task necessary    Comorbidities Affecting Occupational Performance: Presence of comorbidities impacting occupational performance    Modification or Assistance to Complete Evaluation  Max significant modification of tasks or assist is necessary to complete    OT Frequency 2x / week    OT Duration 12 weeks    OT Treatment/Interventions Self-care/ADL training;Therapeutic exercise;Neuromuscular education;Visual/perceptual remediation/compensation;Patient/family education;Therapeutic activities;DME and/or AE instruction;Passive range of motion;Moist Heat;Electrical Stimulation    Consulted and Agree with Plan of Care Patient            Olegario Messier, MS, OTR/L   Olegario Messier, OT 09/27/2021, 5:48 PM

## 2021-09-27 NOTE — Therapy (Signed)
OUTPATIENT PHYSICAL THERAPY TREATMENT NOTE   Patient Name: Paul Hall MRN: [id number] DOB:09-02-1995, 26 y.o., male Today's Date: 09/27/2021  PCP:  Cindi Carbon PROVIDER:  Lenise Herald, PA-C   PT End of Session - 09/27/21 1613     Visit Number 48    Number of Visits 61    Date for PT Re-Evaluation 12/08/21    Authorization Type [employer] and [employer]- Need auth past 12th visit    Authorization Time Period 01/04/21-03/29/21; Recert 03/24/2021-06/16/2021    Progress Note Due on Visit 50    PT Start Time 1516    PT Stop Time 1555    PT Time Calculation (min) 39 min    Equipment Utilized During Treatment Gait belt   Hoyer lift   Activity Tolerance Patient tolerated treatment well   queasiness/nausea   Behavior During Therapy WFL for tasks assessed/performed             Past Medical History:  Diagnosis Date   Diabetes mellitus (HCC)    Hypertension    Stroke [facility])    Past Surgical History:  Procedure Laterality Date   IVC FILTER PLACEMENT ([facility] HX)     LEG SURGERY     PEG TUBE PLACEMENT     TRACHEOSTOMY     Patient Active Problem List   Diagnosis Date Noted   Sepsis due to vancomycin resistant Enterococcus species (HCC) 06/06/2019   SIRS (systemic inflammatory response syndrome) (HCC) 06/05/2019   Acute lower UTI 06/05/2019   VRE (vancomycin-resistant Enterococci) infection 06/05/2019   Anemia 06/05/2019   Skin ulcer of sacrum with necrosis of muscle (HCC)    Urinary retention    Type 2 diabetes mellitus without complication, with long-term current use of insulin (HCC)    Tachycardia    Lower extremity edema    Acute metabolic encephalopathy    Obstructive sleep apnea    Morbid obesity with BMI of 60.0-69.9, adult (HCC)    Goals of care, counseling/discussion    Palliative care encounter    Sepsis (HCC) 04/27/2019   H/O insulin dependent diabetes mellitus 04/27/2019   History of CVA with residual deficit 04/27/2019   Seizure disorder (HCC)  04/27/2019   Decubitus ulcer of sacral region, stage 4 (HCC) 04/27/2019    REFERRING DIAG: Cerebral infarction, unspecified   THERAPY DIAG:  Muscle weakness (generalized)  Other lack of coordination  History of CVA with residual deficit  Difficulty in walking, not elsewhere classified  Unsteadiness on feet  Abnormality of gait and mobility  Other abnormalities of gait and mobility  Rationale for Evaluation and Treatment Rehabilitation  PERTINENT HISTORY: Paul Hall is a 25yoM who presents with severe weakness, quadriparesis, altered sensorium, and visual impairment s/p critical illness and prolonged hospitalization. Pt hospitalized in October 2020 with ARDS 2/2 COVID19 infection. Pt sustained a complex and lengthy hospitalization which included tracheostomy, prolonged sedation, ECMO. In this period pt sustained CVA and SDH. Pt has now been liberated from tracheostomy and G-tube. Pt has since been hospitalized for wound infection and UTI. Pt lives with parents at home, has hospital bed and left chair, hoyer lift transfers, and power WC for mobility needs. Pt needs heavy physical assistance with ADL 2/2 BUE contractures and motor dysfunction   PRECAUTIONS: Fall  SUBJECTIVE: Patient reports doing well overall today and states he is ready to try to stand if he can today.   PAIN:  Are you having pain? No     TODAY'S TREATMENT:  09/27/2021  Therapeutic Activities: Patient agreeable  to try to sit at edge of bed.  - Dependent hoyer lift from w/c to mat table with LE hanging off edge-  - reclined position with feet off side of mat  to sit at edge of bed- Max A - Sitting at edge of mat x 9 min with SBA and able to perform- sitting upright and erect, punches L&R, 10 marches - able to clear feet  - Sitting at edge of mat x 4 min with SBA  Therex:  - Patient performed LAQ from edge of mat- able to extend both knees today In w/c- Patient performed seated hip march, LAQ, manual  resisted hip add/abd/hamstring bilaterally today x 15 reps each.   Education provided throughout session via VC/TC and demonstration to facilitate movement at target joints and correct muscle activation for all testing and exercises performed.       PATIENT EDUCATION: Education details: posture cues with sitting Person educated: Patient Education method: Explanation, Demonstration, Tactile cues, and Verbal cues Education comprehension: verbalized understanding, returned demonstration, verbal cues required, tactile cues required, and needs further education   HOME EXERCISE PROGRAM:    PT Short Term Goals -       PT SHORT TERM GOAL #1   Title Pt will be independent with HEP in order to improve strength and balance in order to decrease fall risk and improve function at home and work.    Baseline 01/04/2021= No formal HEP in place; 12/12 no HEP in place; 05/10/2021-Patient and his father were able to report compliance with curent HEP consisting of mostly seated/reclined LE strengthening. Both verbalize no questions at this time.    Time 6    Period Weeks    Status Achieved    Target Date 02/15/21              PT Long Term Goals -      PT LONG TERM GOAL #1   Title Patient will increase BLE gross strength by 1/2 muscle grade to improve functional strength for improved independence with potential gait, increased standing tolerance and increased ADL ability.    Baseline 01/04/2021- Patient presents with 1/5 to 3-/5 B LE strength with MMT; 12/12: goal partially met for Left knee/hip; 05/10/2021= 2-/5 bilateal Hip flex; 3+/5 bilateral Knee ext; 06/21/2021= Patient presents with 2-/5 bilateral Hip flex; 3+/5 bilateral knee ext/flex; 2-/5 left ankle DF; 0/5 right ankle- and able to increase reps and resistance with LE's. 09/15/2021- Patient technically presents with 2-/5 B hip flex/abd/add - but he is able to raise his hip up to approx 100 deg which has improved. 3+/5 Bilateral knee ext, 2-/5 left  ankle and 0/5 right ankle.    Time 12    Period Weeks    Status On-going    Target Date 12/08/21      PT LONG TERM GOAL #2   Title Patient will tolerate sitting unsupported demonstrating erect sitting posture for 15 minutes with CGA to demonstrate improved back extensor strength and improved sitting tolerance.    Baseline 01/04/2021- Patient confied to sitting in lift chair or electric power chair with back support and unable to sit upright without physical assistance; 12/12: tolerates <1 minutes upright unsupported sitting. 05/10/2021=static sit with forward trunk lean  in his power wheelchair without back support x approx 3 min. 06/21/2021=Unable to assess today due to patient with acute back pain but on previous visit able to sit x 8 min without back support. 09/15/2021- on last visit- 09/13/2021- patient was able to sit  unsupported x 8 min at edge of mat.    Time 12    Period Weeks    Status On-going    Target Date 12/08/21      PT LONG TERM GOAL #3   Title Patient will demonstrate ability to perform static standing in // bars > 2 min with Max Assist  without loss of balance and fair posture for improved overall strength for pre-gait and transfer activities.    Baseline 01/04/2021= Patient current uanble to stand- Dependent on hoyer or sit to stand lift for transfers. 05/10/2021=Not appropriate yet- Currently still dependent with all transfers using hoyer. 06/21/2021= Patient continuing now to focus on LE strengthening to prepare for standing-unable to try today due to acute low back pain-  planning on attempting in new cert period. 09/15/2021- Patient has attempted standing 2x in past two week- max Assist of 2 people - only once was he successful to clearing his bottom from chair - Will continue to be a focus during the new certification.    Time 12    Period Weeks    Status On-going    Target Date 12/08/21      PT LONG TERM GOAL #4   Title Pt will improve FOTO score by 10 points or more  demonstrating improved perceived functional ability    Baseline FOTO 7 on 10/17; 03/15/21: FOTO 12; 05/10/2021 06/21/2021= 1; 09/15/2021= 9    Time 12    Period Weeks    Status On-going    Target Date 12/08/21      PT LONG TERM GOAL #5   Title Patient will perform sit to stand transfer with appropriate AD and max assist of 2 people with 75% consistency to prepare for pregait activities.    Baseline 09/15/2021= Patient unable to stand well- unable to clear his bottom off chair with Max assist of 2 persons.    Time 12    Period Weeks    Status New    Target Date 12/08/21              Plan - 09/22/21 1659     Clinical Impression Statement Patient able to demo some progress including increased overall time sitting unsupported at edge of mat today. Patient fatigued after sitting for prolonged time. He was able to march and kick both legs well - able to clear floor today. Patient would benefit from additional skilled PT intervention to improve strength, balance and mobility for improved quality of life.    Personal Factors and Comorbidities Comorbidity 3+;Time since onset of injury/illness/exacerbation    Comorbidities CVA, diabetes, Seizures    Examination-Activity Limitations Bathing;Bed Mobility;Bend;Caring for Others;Carry;Dressing;Hygiene/Grooming;Lift;Locomotion Level;Reach Overhead;Self Feeding;Sit;Squat;Stairs;Stand;Transfers;Toileting    Examination-Participation Restrictions Cleaning;Community Activity;Driving;Laundry;Medication Management;Meal Prep;Occupation;Personal Finances;Shop;Yard Work;Volunteer    Stability/Clinical Decision Making Evolving/Moderate complexity    Rehab Potential Fair    PT Frequency 2x / week    PT Duration 12 weeks    PT Treatment/Interventions ADLs/Self Care Home Management;Cryotherapy;Electrical Stimulation;Moist Heat;Ultrasound;DME Instruction;Gait training;Stair training;Functional mobility training;Therapeutic exercise;Balance training;Patient/family  education;Orthotic Fit/Training;Neuromuscular re-education;Wheelchair mobility training;Manual techniques;Passive range of motion;Dry needling;Energy conservation;Taping;Visual/perceptual remediation/compensation;Joint Manipulations    PT Next Visit Plan core strength- sitting at EOM, postural control, sabina sit to stand if able; Continue with progressive LE Strengthening    PT Home Exercise Plan No changes to HEP today    Consulted and Agree with Plan of Care Patient;Family member/caregiver    Family Member Consulted Dad  Lenda Kelp, PT 09/27/2021, 4:29 PM

## 2021-09-29 ENCOUNTER — Ambulatory Visit: Payer: BC Managed Care – PPO

## 2021-09-29 ENCOUNTER — Ambulatory Visit: Payer: BC Managed Care – PPO | Admitting: Occupational Therapy

## 2021-09-29 DIAGNOSIS — I6930 Unspecified sequelae of cerebral infarction: Secondary | ICD-10-CM

## 2021-09-29 DIAGNOSIS — R2681 Unsteadiness on feet: Secondary | ICD-10-CM

## 2021-09-29 DIAGNOSIS — R262 Difficulty in walking, not elsewhere classified: Secondary | ICD-10-CM

## 2021-09-29 DIAGNOSIS — R278 Other lack of coordination: Secondary | ICD-10-CM | POA: Diagnosis not present

## 2021-09-29 DIAGNOSIS — M6281 Muscle weakness (generalized): Secondary | ICD-10-CM

## 2021-09-29 DIAGNOSIS — R269 Unspecified abnormalities of gait and mobility: Secondary | ICD-10-CM | POA: Diagnosis not present

## 2021-09-29 DIAGNOSIS — R2689 Other abnormalities of gait and mobility: Secondary | ICD-10-CM | POA: Diagnosis not present

## 2021-09-29 NOTE — Therapy (Signed)
OUTPATIENT PHYSICAL THERAPY TREATMENT NOTE   Patient Name: Paul Hall MRN: [id number] DOB:08-Dec-1995, 26 y.o., male Today's Date: 09/30/2021  PCP:  Loistine Chance PROVIDER:  Collene Mares, PA-C   PT End of Session - 09/29/21 1526     Visit Number 49    Number of Visits 61    Date for PT Re-Evaluation 12/08/21    Authorization Type [employer] and [employer]- Need auth past 12th visit    Authorization Time Period 01/04/21-03/29/21; Recert [id number]/03/[id number]/0051    Progress Note Due on Visit 71    PT Start Time 1515    PT Stop Time 1557    PT Time Calculation (min) 42 min    Equipment Utilized During Treatment Gait belt   Hoyer lift   Activity Tolerance Patient tolerated treatment well   queasiness/nausea   Behavior During Therapy WFL for tasks assessed/performed             Past Medical History:  Diagnosis Date   Diabetes mellitus ([place])    Hypertension    Stroke [facility])    Past Surgical History:  Procedure Laterality Date   IVC FILTER PLACEMENT ([place] HX)     LEG SURGERY     PEG TUBE PLACEMENT     TRACHEOSTOMY     Patient Active Problem List   Diagnosis Date Noted   Sepsis due to vancomycin resistant Enterococcus species ([place]) 06/06/2019   SIRS (systemic inflammatory response syndrome) ([place]) 06/05/2019   Acute lower UTI 06/05/2019   VRE (vancomycin-resistant Enterococci) infection 06/05/2019   Anemia 06/05/2019   Skin ulcer of sacrum with necrosis of muscle ([place])    Urinary retention    Type 2 diabetes mellitus without complication, with long-term current use of insulin (HCC)    Tachycardia    Lower extremity edema    Acute metabolic encephalopathy    Obstructive sleep apnea    Morbid obesity with BMI of 60.0-69.9, adult ([place])    Goals of care, counseling/discussion    Palliative care encounter    Sepsis ([place]) 04/27/2019   H/O insulin dependent diabetes mellitus 04/27/2019   History of CVA with residual deficit 04/27/2019   Seizure disorder ([place])  04/27/2019   Decubitus ulcer of sacral region, stage 4 ([place]) 04/27/2019    REFERRING DIAG: Cerebral infarction, unspecified   THERAPY DIAG:  Other lack of coordination  Muscle weakness (generalized)  History of CVA with residual deficit  Difficulty in walking, not elsewhere classified  Unsteadiness on feet  Abnormality of gait and mobility  Other abnormalities of gait and mobility  Rationale for Evaluation and Treatment Rehabilitation  PERTINENT HISTORY: Paul Hall is a 25yoM who presents with severe weakness, quadriparesis, altered sensorium, and visual impairment s/p critical illness and prolonged hospitalization. Pt hospitalized in October 2020 with ARDS 2/2 COVID19 infection. Pt sustained a complex and lengthy hospitalization which included tracheostomy, prolonged sedation, ECMO. In this period pt sustained CVA and SDH. Pt has now been liberated from tracheostomy and G-tube. Pt has since been hospitalized for wound infection and UTI. Pt lives with parents at home, has hospital bed and left chair, hoyer lift transfers, and power WC for mobility needs. Pt needs heavy physical assistance with ADL 2/2 BUE contractures and motor dysfunction   PRECAUTIONS: Fall  SUBJECTIVE: Patient reports continuing to do well and denies any new issues  PAIN:  Are you having pain? No     TODAY'S TREATMENT:  Therex:   Bicep Curls - RTB (Left UE) and YTB (right UE) - 2  sets of 12 reps. Tricep press RTB (left LE) and YTB (Right UE) - 2 sets x 12 reps. -  LAQ from w/c - able to extend both knees today -seated hip march AAROM x 15 reps -manual resisted hip add/abd/hamstring bilaterally today x 15 reps each.  -Manual resistance  leg press -2 sets of 12 reps -Chest press with RTB x 12 reps.  Education provided throughout session via VC/TC and demonstration to facilitate movement at target joints and correct muscle activation for all testing and exercises performed.       PATIENT  EDUCATION: Education details: posture cues with sitting Person educated: Patient Education method: Explanation, Demonstration, Tactile cues, and Verbal cues Education comprehension: verbalized understanding, returned demonstration, verbal cues required, tactile cues required, and needs further education   HOME EXERCISE PROGRAM:    PT Short Term Goals -       PT SHORT TERM GOAL #1   Title Pt will be independent with HEP in order to improve strength and balance in order to decrease fall risk and improve function at home and work.    Baseline 01/04/2021= No formal HEP in place; 12/12 no HEP in place; 05/10/2021-Patient and his father were able to report compliance with curent HEP consisting of mostly seated/reclined LE strengthening. Both verbalize no questions at this time.    Time 6    Period Weeks    Status Achieved    Target Date 02/15/21              PT Long Term Goals -      PT LONG TERM GOAL #1   Title Patient will increase BLE gross strength by 1/2 muscle grade to improve functional strength for improved independence with potential gait, increased standing tolerance and increased ADL ability.    Baseline 01/04/2021- Patient presents with 1/5 to 3-/5 B LE strength with MMT; 12/12: goal partially met for Left knee/hip; 05/10/2021= 2-/5 bilateal Hip flex; 3+/5 bilateral Knee ext; 06/21/2021= Patient presents with 2-/5 bilateral Hip flex; 3+/5 bilateral knee ext/flex; 2-/5 left ankle DF; 0/5 right ankle- and able to increase reps and resistance with LE's. 09/15/2021- Patient technically presents with 2-/5 B hip flex/abd/add - but he is able to raise his hip up to approx 100 deg which has improved. 3+/5 Bilateral knee ext, 2-/5 left ankle and 0/5 right ankle.    Time 12    Period Weeks    Status On-going    Target Date 12/08/21      PT LONG TERM GOAL #2   Title Patient will tolerate sitting unsupported demonstrating erect sitting posture for 15 minutes with CGA to demonstrate improved  back extensor strength and improved sitting tolerance.    Baseline 01/04/2021- Patient confied to sitting in lift chair or electric power chair with back support and unable to sit upright without physical assistance; 12/12: tolerates <1 minutes upright unsupported sitting. 05/10/2021=static sit with forward trunk lean  in his power wheelchair without back support x approx 3 min. 06/21/2021=Unable to assess today due to patient with acute back pain but on previous visit able to sit x 8 min without back support. 09/15/2021- on last visit- 09/13/2021- patient was able to sit unsupported x 8 min at edge of mat.    Time 12    Period Weeks    Status On-going    Target Date 12/08/21      PT LONG TERM GOAL #3   Title Patient will demonstrate ability to perform static standing in //  bars > 2 min with Max Assist  without loss of balance and fair posture for improved overall strength for pre-gait and transfer activities.    Baseline 01/04/2021= Patient current uanble to stand- Dependent on hoyer or sit to stand lift for transfers. 05/10/2021=Not appropriate yet- Currently still dependent with all transfers using hoyer. 06/21/2021= Patient continuing now to focus on LE strengthening to prepare for standing-unable to try today due to acute low back pain-  planning on attempting in new cert period. 09/15/2021- Patient has attempted standing 2x in past two week- max Assist of 2 people - only once was he successful to clearing his bottom from chair - Will continue to be a focus during the new certification.    Time 12    Period Weeks    Status On-going    Target Date 12/08/21      PT LONG TERM GOAL #4   Title Pt will improve FOTO score by 10 points or more demonstrating improved perceived functional ability    Baseline FOTO 7 on 10/17; 03/15/21: FOTO 12; 05/10/2021 06/21/2021= 1; 09/15/2021= 9    Time 12    Period Weeks    Status On-going    Target Date 12/08/21      PT LONG TERM GOAL #5   Title Patient will perform sit to  stand transfer with appropriate AD and max assist of 2 people with 75% consistency to prepare for pregait activities.    Baseline 09/15/2021= Patient unable to stand well- unable to clear his bottom off chair with Max assist of 2 persons.    Time 12    Period Weeks    Status New    Target Date 12/08/21              Plan     Clinical Impression Statement Patient performed well- engaging his right arm and right LE better with exercises today. He was able to extend right arm with practice during UE exercises and much improved power with leg press than in previous visits. Patient would benefit from additional skilled PT intervention to improve strength, balance and mobility for improved quality of life.    Personal Factors and Comorbidities Comorbidity 3+;Time since onset of injury/illness/exacerbation    Comorbidities CVA, diabetes, Seizures    Examination-Activity Limitations Bathing;Bed Mobility;Bend;Caring for Others;Carry;Dressing;Hygiene/Grooming;Lift;Locomotion Level;Reach Overhead;Self Feeding;Sit;Squat;Stairs;Stand;Transfers;Toileting    Examination-Participation Restrictions Cleaning;Community Activity;Driving;Laundry;Medication Management;Meal Prep;Occupation;Personal Finances;Shop;Yard Work;Volunteer    Stability/Clinical Decision Making Evolving/Moderate complexity    Rehab Potential Fair    PT Frequency 2x / week    PT Duration 12 weeks    PT Treatment/Interventions ADLs/Self Care Home Management;Cryotherapy;Electrical Stimulation;Moist Heat;Ultrasound;DME Instruction;Gait training;Stair training;Functional mobility training;Therapeutic exercise;Balance training;Patient/family education;Orthotic Fit/Training;Neuromuscular re-education;Wheelchair mobility training;Manual techniques;Passive range of motion;Dry needling;Energy conservation;Taping;Visual/perceptual remediation/compensation;Joint Manipulations    PT Next Visit Plan core strength- sitting at EOM, postural control, sabina  sit to stand if able; Continue with progressive LE Strengthening    PT Home Exercise Plan No changes to HEP today    Consulted and Agree with Plan of Care Patient;Family member/caregiver    Family Member Consulted Dad               Lewis Moccasin, PT 09/30/2021, 5:13 PM

## 2021-09-29 NOTE — Therapy (Addendum)
OUTPATIENT OCCUPATIONAL THERAPY TREATMENT NOTE   Patient Name: Paul Hall MRN: [id number] DOB:[birth year], 26 y.o., male Today's Date: 09/29/2021  REFERRING PROVIDER:  Lenise Herald, MD .otendofsession   OT End of Session - 09/27/21 1746        Visit number                                                                59      Number of visits                                                          96      Date of Revaluation                                                    12/07/2021  OT Start Time 1600    OT Stop Time 1645    OT Time Calculation (min) 45 min    Activity Tolerance Patient tolerated treatment well    Behavior During Therapy [facility] for tasks assessed/performed         .    Past Medical History:  Diagnosis Date   Diabetes mellitus (HCC)    Hypertension    Stroke [facility])    Past Surgical History:  Procedure Laterality Date   IVC FILTER PLACEMENT ([facility] HX)     LEG SURGERY     PEG TUBE PLACEMENT     TRACHEOSTOMY     Patient Active Problem List   Diagnosis Date Noted   Sepsis due to vancomycin resistant Enterococcus species (HCC) 06/06/2019   SIRS (systemic inflammatory response syndrome) (HCC) 06/05/2019   Acute lower UTI 06/05/2019   VRE (vancomycin-resistant Enterococci) infection 06/05/2019   Anemia 06/05/2019   Skin ulcer of sacrum with necrosis of muscle (HCC)    Urinary retention    Type 2 diabetes mellitus without complication, with long-term current use of insulin (HCC)    Tachycardia    Lower extremity edema    Acute metabolic encephalopathy    Obstructive sleep apnea    Morbid obesity with BMI of 60.0-69.9, adult (HCC)    Goals of care, counseling/discussion    Palliative care encounter    Sepsis (HCC) 04/27/2019   H/O insulin dependent diabetes mellitus 04/27/2019   History of CVA with residual deficit 04/27/2019   Seizure disorder (HCC) 04/27/2019   Decubitus ulcer of sacral region, stage 4 (HCC) 04/27/2019       REFERRING DIAG: CVA, COVID-19  THERAPY DIAG:  Muscle weakness (generalized)  Rationale for Evaluation and Treatment Rehabilitation  PERTINENT HISTORY: Pt. is a 26 y.o. male who was hospitalized, and diagnosed with COVID-19, sustained a CVA with resultant quadreplegia in 01/2019. Pt. was hospitalized with VRE  UTI. Pt. PMHx includes: urinary retention, Seizure Disorder, Obstructive Sleep Disorder, DM Type II, and Morbid obesity. Pt. resides at home with his parents  PRECAUTIONS: No  SUBJECTIVE: Pt. reports that the nurse  at his physician's office mentioned that the order for the splint has been approved.   PAIN:  Are you having pain? No     OBJECTIVE:   TODAY'S TREATMENT:    Estim attended:   Pt. tolerated neuromuscular estim  for cycle time 10/10,  duty cycle 50%. Applied to the bilateral wrist, and digit extensors for . Each with the right intensity set to 42, and the left intensity set to 42, and constant monitoring 100% of the time. Manual assist was required for wrist and digit extension with each rep. To hold the position.    Therapeutic Exercise:   Pt. tolerated bilateral wrist flexion, extension, and MP, PIP, and DIP flexion, and extension without reports of pain this afternoon. Patient tolerated moist heat modality for 5 min. to his bilateral hands prior to range of motion.    Pt. continues to present with bilateral wrist, and digit flexor tightness, however is less than during Botox. Pt. Tolerated ROM, and EStim  well in the digits today. Pt. was able to tolerate 8 min. of Estim to both the right, and left forearm for wrist, and digit extension. Pt. Presents with less pain overall in the right 3rd, and 4th digit PIPs. Pt. continues to benefit from OT services  to improve ROM, and facilitate active volitional movement in order to work towards engaging his BUEs in daily ADLs, and IADL tasks.              PATIENT EDUCATION: Education details: BUE  functioning, ROM Person educated: Patient and Caregiver Education method: Explanation, Demonstration, and Verbal cues Education comprehension: verbalized understanding, returned demonstration, verbal cues required, and needs further education   HOME EXERCISE PROGRAM   BUE, hand, and digit ROM   OT Short Term Goals - 09/13/21 1641       OT SHORT TERM GOAL #1   Title Pt and caregiver will obtain splint and follow wearing schedule consistently 7 days/week    Baseline 09/14/21: working to obtain splint    Time 6    Period Weeks    Status New    Target Date 10/26/21              OT Long Term Goals - 09/13/21 1623       OT LONG TERM GOAL #1   Title Pt. will improve FOTO score by 5 points for clinically significant improvements during ADLs, and IADLs,    Baseline 30th visit: FOTO: 37, 21st: FOTO 43. 10th Visit: FOTO score 42 Eval: FOTO score: 41 FOTO score: 39; 50th: FOTO 52; 09/13/21: FOTO 41    Time 12    Period Weeks    Status On-going    Target Date 12/07/21      OT LONG TERM GOAL #2   Title Pt. will improve right shoulder flexion by 15 degrees to assist caregivers with UE dressing    Baseline Pt. continues to require assist with UE dressing secondary to limited ROM; 08/16/21: Max A-total A UB dressing; R shoulder flexion/abd combo 0-100; 09/13/21: R shoulder flexion/abd combo 0-110, R shoulder hiking noted    Time 12    Period Weeks    Status Revised    Target Date 12/07/21      OT LONG TERM GOAL #3   Title Pt. will improve right Abduction by 10 degrees to assist caregivers with underarm hygiene care.    Baseline Eval: AROM abduction RUE 76. Pt. continues to present with limited bilateral abduction. 21st: AROM abduction RUE 80.06/16/2021:  Right shulder abduction 108. 06/23/2021: Right shoulder abduction: 110; 08/16/21: abd 110    Time 12    Period Weeks    Status Achieved    Target Date 09/08/21      OT LONG TERM GOAL #4   Title Pt. will demonstrate visual compensatory  strategies for navigating through his environment.    Baseline 30th visit: Pt. continues to require assist to navigate through his environment. 20th: Pt continues to require assist to navigate environment, minimal knowledge of strategies. Eval: Limited 06/23/2021: Pt. continues to be limited, and requires assist  with navigating through his environment; 08/16/21: father reports pt only steers his own wc in 1 open room of their house, otherwise dep to navigate environment. 09/13/21: limited steering outside of house, steers in 1 room of house. 09/13/21: pt seeing low vision OT specialist to address.    Time 12    Period Weeks    Status Deferred    Target Date 09/08/21      OT LONG TERM GOAL #5   Title Pt. will demonstrate visual compensatory strategies for tabletop tasks within his near space.    Baseline 06/16/2021: Pt. continues to present with visual limitations for tabletop tasks in his near space. Pt. continues to present with limited vision. 21st: Pt can identify objects on tabletop ~waterbottle sized, improves with contrasting colours, cannot ID small onjects; 08/16/21: no change from last assessment period. 09/13/21: pt seeing low vision OT specialist to address.    Time 12    Period Weeks    Status Deferred    Target Date 09/08/21      OT LONG TERM GOAL #6   Title Pt. will perform self feeding with SETUP using adaptive equipment    Baseline 06/23/2021: Pt. is able to use his bilateral hands to perfrom hand to mouth patterns for self-feeding with finger foods. Pt. is now able to use his left hand to bring a water bottle to his mouth,a nd drink from it. 30th visit: Pt. continues to present with weakness limiting functional hand to mouth patterns of movement. Pt. is able to perform hand to mouth patterns, however reports weakness in his hand  limits a secure grasp on the utensil. Pt. requires assist to perform; Eval: Pt. has difficulty; 08/16/21: pt continues to use L hand for self feeding finger foods  with set up.  Spasticity limits dexterity to manipulate eating utensils. 09/13/21: can grip built up handle however requires MIN A 2/2 vision and wrist ROM deficits.    Time 12    Period Weeks    Status Revised    Target Date 12/07/21      OT LONG TERM GOAL #7   Title Pt. will perform one step self-grooming tasks with SETUP    Baseline 06/23/2021: Pt. is able to perform the hand to face pattern with the left hand, however has difficulty actively sustaining his BUEs in elevation long enough to perform self-grooming tasks. 06/16/2021: Pt. is able to perform the hand to face pattern with the left hand, however has difficulty actively sustaining his BUEs in elevation long enough to perform self-grooming tasks; 08/16/21: same as 06/16/21. 09/13/21: limited by trunk and sustaining BUEs    Time 12    Period Weeks    Status Revised    Target Date 12/07/21      OT LONG TERM GOAL #8   Title Pt will complete UB dressing tasks with SETUP and assist to thread over hands only    Baseline  06/23/2021: Pt. requires MinA to thread arms, and requires maxA to donn over upper arms, MaxAx2 to donn over his back. 06/16/2021: Pt. has progressed  to require MinA to thread arms, and requires maxA to donn over upper arms, MaxAx2 to donn over his back; 08/16/21: min A to thread arms, max A to don over upper arms and over head. 09/13/21: MOD A    Time 12    Period Weeks    Status On-going    Target Date 12/07/21      OT LONG TERM GOAL  #9   TITLE Pt will improve B grip strength by 10# to assist in handling ADL items    Baseline 08/16/21: bilat girp 0#; 09/13/21: L grip 6#, R grip 9#    Time 12    Period Weeks    Status Revised    Target Date 12/06/21      OT LONG TERM GOAL  #10   TITLE Pt will improve B lateral pinch strength by 8# to assist in UBD.    Baseline N/A: awaiting pinch meter to be fixed. Pt. unable to formulate proper positioning for lateral pinch assessment of the replacement pinch meter; 08/16/21: R 2#, L 1.5#;  ; 09/13/21: R 6#, L 2#    Time 12    Period Weeks    Status Revised    Target Date 12/07/21              Plan - 09/23/21 1416     Clinical Impression Statement Pt. continues to present with bilateral wrist, and digit flexor tightness, however is less than during Botox. Pt. Tolerated ROM, and EStim  well in the digits today. Pt. was able to tolerate 8 min. of Estim to both the right, and left forearm for wrist, and digit extension. Pt. Presents with less pain overall in the right 3rd, and 4th digit PIPs. Pt. continues to benefit from OT services  to improve ROM, and facilitate active volitional movement in order to work towards engaging his BUEs in daily ADLs, and IADL tasks.             OT Occupational Profile and History Detailed Assessment- Review of Records and additional review of physical, cognitive, psychosocial history related to current functional performance    Occupational performance deficits (Please refer to evaluation for details): ADL's;IADL's    Body Structure / Function / Physical Skills ADL;FMC;ROM;Dexterity;IADL;Endurance;Obesity;Strength;UE functional use    Rehab Potential Good    Clinical Decision Making Multiple treatment options, significant modification of task necessary    Comorbidities Affecting Occupational Performance: Presence of comorbidities impacting occupational performance    Modification or Assistance to Complete Evaluation  Max significant modification of tasks or assist is necessary to complete    OT Frequency 2x / week    OT Duration 12 weeks    OT Treatment/Interventions Self-care/ADL training;Therapeutic exercise;Neuromuscular education;Visual/perceptual remediation/compensation;Patient/family education;Therapeutic activities;DME and/or AE instruction;Passive range of motion;Moist Heat;Electrical Stimulation    Consulted and Agree with Plan of Care Patient            Olegario Messier, MS, OTR/L   Olegario Messier, OT 09/29/2021, 5:32  PM

## 2021-10-01 DIAGNOSIS — Z8616 Personal history of COVID-19: Secondary | ICD-10-CM | POA: Diagnosis not present

## 2021-10-01 DIAGNOSIS — H4720 Unspecified optic atrophy: Secondary | ICD-10-CM | POA: Diagnosis not present

## 2021-10-01 DIAGNOSIS — H547 Unspecified visual loss: Secondary | ICD-10-CM | POA: Diagnosis not present

## 2021-10-04 ENCOUNTER — Encounter: Payer: BC Managed Care – PPO | Admitting: Occupational Therapy

## 2021-10-04 DIAGNOSIS — Z09 Encounter for follow-up examination after completed treatment for conditions other than malignant neoplasm: Secondary | ICD-10-CM | POA: Diagnosis not present

## 2021-10-04 DIAGNOSIS — T80818A Extravasation of other vesicant agent, initial encounter: Secondary | ICD-10-CM | POA: Diagnosis not present

## 2021-10-04 DIAGNOSIS — Z95828 Presence of other vascular implants and grafts: Secondary | ICD-10-CM | POA: Diagnosis not present

## 2021-10-06 ENCOUNTER — Ambulatory Visit: Payer: BC Managed Care – PPO | Attending: Physician Assistant | Admitting: Occupational Therapy

## 2021-10-06 DIAGNOSIS — R2689 Other abnormalities of gait and mobility: Secondary | ICD-10-CM | POA: Diagnosis not present

## 2021-10-06 DIAGNOSIS — R262 Difficulty in walking, not elsewhere classified: Secondary | ICD-10-CM | POA: Insufficient documentation

## 2021-10-06 DIAGNOSIS — M6281 Muscle weakness (generalized): Secondary | ICD-10-CM | POA: Insufficient documentation

## 2021-10-06 DIAGNOSIS — R269 Unspecified abnormalities of gait and mobility: Secondary | ICD-10-CM | POA: Insufficient documentation

## 2021-10-06 DIAGNOSIS — I6930 Unspecified sequelae of cerebral infarction: Secondary | ICD-10-CM | POA: Diagnosis not present

## 2021-10-06 DIAGNOSIS — R278 Other lack of coordination: Secondary | ICD-10-CM | POA: Diagnosis not present

## 2021-10-06 DIAGNOSIS — R2681 Unsteadiness on feet: Secondary | ICD-10-CM | POA: Diagnosis not present

## 2021-10-06 NOTE — Therapy (Signed)
Occupational Therapy Progress Note  Dates of reporting period  08/16/2021   to   10/06/2021    Patient Name: Paul Hall MRN: [id number] DOB:08-25-1995, 26 y.o., male Today's Date: 10/06/2021  REFERRING PROVIDER:  Lenise Herald, MD .otendofsession   OT End of Session        Visit number                                                                60      Number of visits                                                          96      Date of Revaluation                                                    12/07/2021  OT Start Time 1445   OT Stop Time  1515   OT Time Calculation (min) 45 min     Activity Tolerance Progress reporting period starting 08/16/2021 Patient tolerated treatment well    Behavior During Therapy [facility] for tasks assessed/performed         .    Past Medical History:  Diagnosis Date   Diabetes mellitus (HCC)    Hypertension    Stroke [facility])    Past Surgical History:  Procedure Laterality Date   IVC FILTER PLACEMENT ([facility] HX)     LEG SURGERY     PEG TUBE PLACEMENT     TRACHEOSTOMY     Patient Active Problem List   Diagnosis Date Noted   Sepsis due to vancomycin resistant Enterococcus species (HCC) 06/06/2019   SIRS (systemic inflammatory response syndrome) (HCC) 06/05/2019   Acute lower UTI 06/05/2019   VRE (vancomycin-resistant Enterococci) infection 06/05/2019   Anemia 06/05/2019   Skin ulcer of sacrum with necrosis of muscle (HCC)    Urinary retention    Type 2 diabetes mellitus without complication, with long-term current use of insulin (HCC)    Tachycardia    Lower extremity edema    Acute metabolic encephalopathy    Obstructive sleep apnea    Morbid obesity with BMI of 60.0-69.9, adult (HCC)    Goals of care, counseling/discussion    Palliative care encounter    Sepsis (HCC) 04/27/2019   H/O insulin dependent diabetes mellitus 04/27/2019   History of CVA with residual deficit 04/27/2019   Seizure disorder (HCC) 04/27/2019    Decubitus ulcer of sacral region, stage 4 (HCC) 04/27/2019      REFERRING DIAG: CVA, COVID-19  THERAPY DIAG:  Muscle weakness (generalized)  Rationale for Evaluation and Treatment Rehabilitation  PERTINENT HISTORY: Pt. is a 26 y.o. male who was hospitalized, and diagnosed with COVID-19, sustained a CVA with resultant quadreplegia in 01/2019. Pt. was hospitalized with VRE  UTI. Pt. PMHx includes: urinary retention, Seizure Disorder, Obstructive Sleep Disorder, DM Type II, and Morbid obesity. Pt. resides  at home with his parents  PRECAUTIONS: No  SUBJECTIVE: Pt. reports  having had a CT scan this past week  PAIN:  Are you having pain? Tenderness in the left shoulder radiating down through the LUE following the CT scan. N/R      OBJECTIVE:   TODAY'S TREATMENT:   Therapeutic Exercise:   Pt. tolerated bilateral wrist flexion, extension, and MP, PIP, and DIP flexion, and extension without reports of pain this afternoon. Patient tolerated moist heat modality for 7 min. to his bilateral hands prior to range of motion.    Pt. was late for the OT session this afternoon. Pt. Reports having had a CT scan this past week in preparation form removal of filters. Pt. Reports having had pain in the LUE following his CT scan. Pt. continues to present with bilateral wrist, and digit flexor tightness, however is less than during Botox. Pt. tolerated PROM today. Pt. Continues to require assist from caregivers to perform daily ADL, and IADL functioning. Pt. continues to benefit from OT services to improve ROM, and facilitate active volitional movement in order to work towards engaging his BUEs in daily ADLs, and IADL tasks.              PATIENT EDUCATION: Education details: BUE functioning, ROM Person educated: Patient and Caregiver Education method: Explanation, Demonstration, and Verbal cues Education comprehension: verbalized understanding, returned demonstration, verbal cues required, and  needs further education   HOME EXERCISE PROGRAM   BUE, hand, and digit ROM   OT Short Term Goals - 09/13/21 1641       OT SHORT TERM GOAL #1   Title Pt and caregiver will obtain splint and follow wearing schedule consistently 7 days/week    Baseline 09/14/21: working to obtain splint, 60th visit: Pt. Continues to work on obtaining.   Time 6    Period Weeks    Status New    Target Date 10/26/21              OT Long Term Goals - 09/13/21 1623       OT LONG TERM GOAL #1   Title Pt. will improve FOTO score by 5 points for clinically significant improvements during ADLs, and IADLs,    Baseline 30th visit: FOTO: 37, 21st: FOTO 43. 10th Visit: FOTO score 42 Eval: FOTO score: 41 FOTO score: 39; 50th: FOTO 52; 09/13/21: FOTO 41 60th visit: TBD   Time 12    Period Weeks    Status On-going    Target Date 12/07/21      OT LONG TERM GOAL #2   Title Pt. will improve right shoulder flexion by 15 degrees to assist caregivers with UE dressing    Baseline Pt. continues to require assist with UE dressing secondary to limited ROM; 08/16/21: Max A-total A UB dressing; R shoulder flexion/abd combo 0-100; 09/13/21: R shoulder flexion/abd combo 0-110, R shoulder hiking noted 60th visit: Max-Total Assist with UE dressing   Time 12    Period Weeks    Status Revised    Target Date 12/07/21      OT LONG TERM GOAL #3   Title Pt. will improve right Abduction by 10 degrees to assist caregivers with underarm hygiene care.    Baseline Eval: AROM abduction RUE 76. Pt. continues to present with limited bilateral abduction. 21st: AROM abduction RUE 80.06/16/2021: Right shulder abduction 108. 06/23/2021: Right shoulder abduction: 110; 08/16/21: abd 110 60th visit: Pt. MaxA applying deoderant   Time 12  Period Weeks    Status Achieved    Target Date 09/08/21      OT LONG TERM GOAL #4   Title Pt. will demonstrate visual compensatory strategies for navigating through his environment.    Baseline 30th visit:  Pt. continues to require assist to navigate through his environment. 20th: Pt continues to require assist to navigate environment, minimal knowledge of strategies. Eval: Limited 06/23/2021: Pt. continues to be limited, and requires assist  with navigating through his environment; 08/16/21: father reports pt only steers his own wc in 1 open room of their house, otherwise dep to navigate environment. 09/13/21: limited steering outside of house, steers in 1 room of house. 09/13/21: pt seeing low vision OT specialist to address.    Time 12    Period Weeks    Status Deferred    Target Date 09/08/21      OT LONG TERM GOAL #5   Title Pt. will demonstrate visual compensatory strategies for tabletop tasks within his near space.    Baseline 06/16/2021: Pt. continues to present with visual limitations for tabletop tasks in his near space. Pt. continues to present with limited vision. 21st: Pt can identify objects on tabletop ~waterbottle sized, improves with contrasting colours, cannot ID small onjects; 08/16/21: no change from last assessment period. 09/13/21: pt seeing low vision OT specialist to address.    Time 12    Period Weeks    Status Deferred    Target Date 09/08/21      OT LONG TERM GOAL #6   Title Pt. will perform self feeding with SETUP using adaptive equipment    Baseline 06/23/2021: Pt. is able to use his bilateral hands to perfrom hand to mouth patterns for self-feeding with finger foods. Pt. is now able to use his left hand to bring a water bottle to his mouth,a nd drink from it. 30th visit: Pt. continues to present with weakness limiting functional hand to mouth patterns of movement. Pt. is able to perform hand to mouth patterns, however reports weakness in his hand  limits a secure grasp on the utensil. Pt. requires assist to perform; Eval: Pt. has difficulty; 08/16/21: pt continues to use L hand for self feeding finger foods with set up.  Spasticity limits dexterity to manipulate eating utensils.  09/13/21: can grip built up handle however requires MIN A 2/2 vision and wrist ROM deficits. 60th visit: MinA   Time 12    Period Weeks    Status Ongoing   Target Date 12/07/21      OT LONG TERM GOAL #7   Title Pt. will perform one step self-grooming tasks with SETUP    Baseline 06/23/2021: Pt. is able to perform the hand to face pattern with the left hand, however has difficulty actively sustaining his BUEs in elevation long enough to perform self-grooming tasks. 06/16/2021: Pt. is able to perform the hand to face pattern with the left hand, however has difficulty actively sustaining his BUEs in elevation long enough to perform self-grooming tasks; 08/16/21: same as 06/16/21. 09/13/21: limited by trunk and sustaining BUEs 60th visit: Pt.bis limited by sustaining BUEs in elevation.   Time 12    Period Weeks    Status Ongoing    Target Date 12/07/21      OT LONG TERM GOAL #8   Title Pt will complete UB dressing tasks with SETUP and assist to thread over hands only    Baseline 06/23/2021: Pt. requires MinA to thread arms, and requires maxA to  donn over upper arms, MaxAx2 to donn over his back. 06/16/2021: Pt. has progressed  to require MinA to thread arms, and requires maxA to donn over upper arms, MaxAx2 to donn over his back; 08/16/21: min A to thread arms, max A to don over upper arms and over head. 09/13/21: MOD A 60th  visit: ModA   Time 12    Period Weeks    Status On-going    Target Date 12/07/21      OT LONG TERM GOAL  #9   TITLE Pt will improve B grip strength by 10# to assist in handling ADL items    Baseline 08/16/21: bilat girp 0#; 09/13/21: L grip 6#, R grip 9# 60th visit: Limited bilateral grip strength  Pt. Continues to have difficulty handling ADL items.   Time 12    Period Weeks    Status Revised    Target Date 12/06/21      OT LONG TERM GOAL  #10   TITLE Pt will improve B lateral pinch strength by 8# to assist in UBD.    Baseline N/A: awaiting pinch meter to be fixed. Pt. unable  to formulate proper positioning for lateral pinch assessment of the replacement pinch meter; 08/16/21: R 2#, L 1.5#; ; 09/13/21: R 6#, L 2# 60th visit: limited pinch strength   Time 12    Period Weeks    Status Revised    Target Date 12/07/21              Plan - 09/23/21 1416     Clinical Impression Statement Pt. was late for the OT session this afternoon. Pt. Reports having had a CT scan this past week in preparation form removal of filters. Pt. Reports having had pain in the LUE following his CT scan. Pt. continues to present with bilateral wrist, and digit flexor tightness, however is less than during Botox. Pt. tolerated PROM today. Pt. Continues to require assist from caregivers to perform daily ADL, and IADL functioning. Pt. continues to benefit from OT services to improve ROM, and facilitate active volitional movement in order to work towards engaging his BUEs in daily ADLs, and IADL tasks.                OT Occupational Profile and History Detailed Assessment- Review of Records and additional review of physical, cognitive, psychosocial history related to current functional performance    Occupational performance deficits (Please refer to evaluation for details): ADL's;IADL's    Body Structure / Function / Physical Skills ADL;FMC;ROM;Dexterity;IADL;Endurance;Obesity;Strength;UE functional use    Rehab Potential Good    Clinical Decision Making Multiple treatment options, significant modification of task necessary    Comorbidities Affecting Occupational Performance: Presence of comorbidities impacting occupational performance    Modification or Assistance to Complete Evaluation  Max significant modification of tasks or assist is necessary to complete    OT Frequency 2x / week    OT Duration 12 weeks    OT Treatment/Interventions Self-care/ADL training;Therapeutic exercise;Neuromuscular education;Visual/perceptual remediation/compensation;Patient/family education;Therapeutic  activities;DME and/or AE instruction;Passive range of motion;Moist Heat;Electrical Stimulation    Consulted and Agree with Plan of Care Patient            Olegario Messier, MS, OTR/L   Olegario Messier, OT 10/06/2021, 5:50 PM

## 2021-10-11 ENCOUNTER — Encounter: Payer: BC Managed Care – PPO | Admitting: Occupational Therapy

## 2021-10-12 ENCOUNTER — Ambulatory Visit: Payer: BC Managed Care – PPO | Admitting: Occupational Therapy

## 2021-10-12 ENCOUNTER — Ambulatory Visit: Payer: BC Managed Care – PPO

## 2021-10-12 ENCOUNTER — Encounter: Payer: Self-pay | Admitting: Occupational Therapy

## 2021-10-12 DIAGNOSIS — R278 Other lack of coordination: Secondary | ICD-10-CM | POA: Diagnosis not present

## 2021-10-12 DIAGNOSIS — M6281 Muscle weakness (generalized): Secondary | ICD-10-CM | POA: Diagnosis not present

## 2021-10-12 DIAGNOSIS — R2681 Unsteadiness on feet: Secondary | ICD-10-CM

## 2021-10-12 DIAGNOSIS — I6930 Unspecified sequelae of cerebral infarction: Secondary | ICD-10-CM

## 2021-10-12 DIAGNOSIS — R2689 Other abnormalities of gait and mobility: Secondary | ICD-10-CM

## 2021-10-12 DIAGNOSIS — R262 Difficulty in walking, not elsewhere classified: Secondary | ICD-10-CM

## 2021-10-12 DIAGNOSIS — R269 Unspecified abnormalities of gait and mobility: Secondary | ICD-10-CM

## 2021-10-12 NOTE — Therapy (Deleted)
Occupational Therapy Progress Note  Dates of reporting period  08/16/2021   to   10/06/2021    Patient Name: Paul Hall MRN: [id number] DOB:[birth year], 26 y.o., male Today's Date: 10/12/2021  REFERRING PROVIDER:  Lenise Herald, MD .otendofsession   OT End of Session        Visit number                                                                60      Number of visits                                                          96      Date of Revaluation                                                    12/07/2021  OT Start Time 1445   OT Stop Time  1515   OT Time Calculation (min) 45 min     Activity Tolerance Progress reporting period starting 08/16/2021 Patient tolerated treatment well    Behavior During Therapy [facility] for tasks assessed/performed         .    Past Medical History:  Diagnosis Date   Diabetes mellitus (HCC)    Hypertension    Stroke [facility])    Past Surgical History:  Procedure Laterality Date   IVC FILTER PLACEMENT ([facility] HX)     LEG SURGERY     PEG TUBE PLACEMENT     TRACHEOSTOMY     Patient Active Problem List   Diagnosis Date Noted   Sepsis due to vancomycin resistant Enterococcus species (HCC) 06/06/2019   SIRS (systemic inflammatory response syndrome) (HCC) 06/05/2019   Acute lower UTI 06/05/2019   VRE (vancomycin-resistant Enterococci) infection 06/05/2019   Anemia 06/05/2019   Skin ulcer of sacrum with necrosis of muscle (HCC)    Urinary retention    Type 2 diabetes mellitus without complication, with long-term current use of insulin (HCC)    Tachycardia    Lower extremity edema    Acute metabolic encephalopathy    Obstructive sleep apnea    Morbid obesity with BMI of 60.0-69.9, adult (HCC)    Goals of care, counseling/discussion    Palliative care encounter    Sepsis (HCC) 04/27/2019   H/O insulin dependent diabetes mellitus 04/27/2019   History of CVA with residual deficit 04/27/2019   Seizure disorder (HCC) 04/27/2019    Decubitus ulcer of sacral region, stage 4 (HCC) 04/27/2019      REFERRING DIAG: CVA, COVID-19  THERAPY DIAG:  Muscle weakness (generalized)  Rationale for Evaluation and Treatment Rehabilitation  PERTINENT HISTORY: Pt. is a 26 y.o. male who was hospitalized, and diagnosed with COVID-19, sustained a CVA with resultant quadreplegia in 01/2019. Pt. was hospitalized with VRE  UTI. Pt. PMHx includes: urinary retention, Seizure Disorder, Obstructive Sleep Disorder, DM Type II, and Morbid obesity. Pt. resides  at home with his parents  PRECAUTIONS: No  SUBJECTIVE: Pt. reports  having had a CT scan this past week  PAIN:  Are you having pain? Tenderness in the left shoulder radiating down through the LUE following the CT scan. N/R      OBJECTIVE:   TODAY'S TREATMENT:   Therapeutic Exercise:   Pt. tolerated bilateral wrist flexion, extension, and MP, PIP, and DIP flexion, and extension without reports of pain this afternoon. Patient tolerated moist heat modality for 7 min. to his bilateral hands prior to range of motion.    Pt. was late for the OT session this afternoon. Pt. Reports having had a CT scan this past week in preparation form removal of filters. Pt. Reports having had pain in the LUE following his CT scan. Pt. continues to present with bilateral wrist, and digit flexor tightness, however is less than during Botox. Pt. tolerated PROM today. Pt. Continues to require assist from caregivers to perform daily ADL, and IADL functioning. Pt. continues to benefit from OT services to improve ROM, and facilitate active volitional movement in order to work towards engaging his BUEs in daily ADLs, and IADL tasks.              PATIENT EDUCATION: Education details: BUE functioning, ROM Person educated: Patient and Caregiver Education method: Explanation, Demonstration, and Verbal cues Education comprehension: verbalized understanding, returned demonstration, verbal cues required, and  needs further education   HOME EXERCISE PROGRAM   BUE, hand, and digit ROM   OT Short Term Goals - 09/13/21 1641       OT SHORT TERM GOAL #1   Title Pt and caregiver will obtain splint and follow wearing schedule consistently 7 days/week    Baseline 09/14/21: working to obtain splint, 60th visit: Pt. Continues to work on obtaining.   Time 6    Period Weeks    Status New    Target Date 10/26/21              OT Long Term Goals - 09/13/21 1623       OT LONG TERM GOAL #1   Title Pt. will improve FOTO score by 5 points for clinically significant improvements during ADLs, and IADLs,    Baseline 30th visit: FOTO: 37, 21st: FOTO 43. 10th Visit: FOTO score 42 Eval: FOTO score: 41 FOTO score: 39; 50th: FOTO 52; 09/13/21: FOTO 41 60th visit: TBD   Time 12    Period Weeks    Status On-going    Target Date 12/07/21      OT LONG TERM GOAL #2   Title Pt. will improve right shoulder flexion by 15 degrees to assist caregivers with UE dressing    Baseline Pt. continues to require assist with UE dressing secondary to limited ROM; 08/16/21: Max A-total A UB dressing; R shoulder flexion/abd combo 0-100; 09/13/21: R shoulder flexion/abd combo 0-110, R shoulder hiking noted 60th visit: Max-Total Assist with UE dressing   Time 12    Period Weeks    Status Revised    Target Date 12/07/21      OT LONG TERM GOAL #3   Title Pt. will improve right Abduction by 10 degrees to assist caregivers with underarm hygiene care.    Baseline Eval: AROM abduction RUE 76. Pt. continues to present with limited bilateral abduction. 21st: AROM abduction RUE 80.06/16/2021: Right shulder abduction 108. 06/23/2021: Right shoulder abduction: 110; 08/16/21: abd 110 60th visit: Pt. MaxA applying deoderant   Time 12  Period Weeks    Status Achieved    Target Date 09/08/21      OT LONG TERM GOAL #4   Title Pt. will demonstrate visual compensatory strategies for navigating through his environment.    Baseline 30th visit:  Pt. continues to require assist to navigate through his environment. 20th: Pt continues to require assist to navigate environment, minimal knowledge of strategies. Eval: Limited 06/23/2021: Pt. continues to be limited, and requires assist  with navigating through his environment; 08/16/21: father reports pt only steers his own wc in 1 open room of their house, otherwise dep to navigate environment. 09/13/21: limited steering outside of house, steers in 1 room of house. 09/13/21: pt seeing low vision OT specialist to address.    Time 12    Period Weeks    Status Deferred    Target Date 09/08/21      OT LONG TERM GOAL #5   Title Pt. will demonstrate visual compensatory strategies for tabletop tasks within his near space.    Baseline 06/16/2021: Pt. continues to present with visual limitations for tabletop tasks in his near space. Pt. continues to present with limited vision. 21st: Pt can identify objects on tabletop ~waterbottle sized, improves with contrasting colours, cannot ID small onjects; 08/16/21: no change from last assessment period. 09/13/21: pt seeing low vision OT specialist to address.    Time 12    Period Weeks    Status Deferred    Target Date 09/08/21      OT LONG TERM GOAL #6   Title Pt. will perform self feeding with SETUP using adaptive equipment    Baseline 06/23/2021: Pt. is able to use his bilateral hands to perfrom hand to mouth patterns for self-feeding with finger foods. Pt. is now able to use his left hand to bring a water bottle to his mouth,a nd drink from it. 30th visit: Pt. continues to present with weakness limiting functional hand to mouth patterns of movement. Pt. is able to perform hand to mouth patterns, however reports weakness in his hand  limits a secure grasp on the utensil. Pt. requires assist to perform; Eval: Pt. has difficulty; 08/16/21: pt continues to use L hand for self feeding finger foods with set up.  Spasticity limits dexterity to manipulate eating utensils.  09/13/21: can grip built up handle however requires MIN A 2/2 vision and wrist ROM deficits. 60th visit: MinA   Time 12    Period Weeks    Status Ongoing   Target Date 12/07/21      OT LONG TERM GOAL #7   Title Pt. will perform one step self-grooming tasks with SETUP    Baseline 06/23/2021: Pt. is able to perform the hand to face pattern with the left hand, however has difficulty actively sustaining his BUEs in elevation long enough to perform self-grooming tasks. 06/16/2021: Pt. is able to perform the hand to face pattern with the left hand, however has difficulty actively sustaining his BUEs in elevation long enough to perform self-grooming tasks; 08/16/21: same as 06/16/21. 09/13/21: limited by trunk and sustaining BUEs 60th visit: Pt.bis limited by sustaining BUEs in elevation.   Time 12    Period Weeks    Status Ongoing    Target Date 12/07/21      OT LONG TERM GOAL #8   Title Pt will complete UB dressing tasks with SETUP and assist to thread over hands only    Baseline 06/23/2021: Pt. requires MinA to thread arms, and requires maxA to  donn over upper arms, MaxAx2 to donn over his back. 06/16/2021: Pt. has progressed  to require MinA to thread arms, and requires maxA to donn over upper arms, MaxAx2 to donn over his back; 08/16/21: min A to thread arms, max A to don over upper arms and over head. 09/13/21: MOD A 60th  visit: ModA   Time 12    Period Weeks    Status On-going    Target Date 12/07/21      OT LONG TERM GOAL  #9   TITLE Pt will improve B grip strength by 10# to assist in handling ADL items    Baseline 08/16/21: bilat girp 0#; 09/13/21: L grip 6#, R grip 9# 60th visit: Limited bilateral grip strength  Pt. Continues to have difficulty handling ADL items.   Time 12    Period Weeks    Status Revised    Target Date 12/06/21      OT LONG TERM GOAL  #10   TITLE Pt will improve B lateral pinch strength by 8# to assist in UBD.    Baseline N/A: awaiting pinch meter to be fixed. Pt. unable  to formulate proper positioning for lateral pinch assessment of the replacement pinch meter; 08/16/21: R 2#, L 1.5#; ; 09/13/21: R 6#, L 2# 60th visit: limited pinch strength   Time 12    Period Weeks    Status Revised    Target Date 12/07/21              Plan - 09/23/21 1416     Clinical Impression Statement Pt. was late for the OT session this afternoon. Pt. Reports having had a CT scan this past week in preparation form removal of filters. Pt. Reports having had pain in the LUE following his CT scan. Pt. continues to present with bilateral wrist, and digit flexor tightness, however is less than during Botox. Pt. tolerated PROM today. Pt. Continues to require assist from caregivers to perform daily ADL, and IADL functioning. Pt. continues to benefit from OT services to improve ROM, and facilitate active volitional movement in order to work towards engaging his BUEs in daily ADLs, and IADL tasks.                OT Occupational Profile and History Detailed Assessment- Review of Records and additional review of physical, cognitive, psychosocial history related to current functional performance    Occupational performance deficits (Please refer to evaluation for details): ADL's;IADL's    Body Structure / Function / Physical Skills ADL;FMC;ROM;Dexterity;IADL;Endurance;Obesity;Strength;UE functional use    Rehab Potential Good    Clinical Decision Making Multiple treatment options, significant modification of task necessary    Comorbidities Affecting Occupational Performance: Presence of comorbidities impacting occupational performance    Modification or Assistance to Complete Evaluation  Max significant modification of tasks or assist is necessary to complete    OT Frequency 2x / week    OT Duration 12 weeks    OT Treatment/Interventions Self-care/ADL training;Therapeutic exercise;Neuromuscular education;Visual/perceptual remediation/compensation;Patient/family education;Therapeutic  activities;DME and/or AE instruction;Passive range of motion;Moist Heat;Electrical Stimulation    Consulted and Agree with Plan of Care Patient            Olegario Messier, MS, OTR/L   Olegario Messier, OT 10/12/2021, 5:08 PM

## 2021-10-12 NOTE — Therapy (Signed)
OUTPATIENT OCCUPATIONAL THERAPY TREATMENT NOTE   Patient Name: Paul Hall MRN: [id number] DOB:1995-04-11, 26 y.o., male Today's Date: 10/12/2021  REFERRING PROVIDER:  Lenise Herald, MD .otendofsession   OT End of Session -        Visit number                                                                61      Number of visits                                                          96      Date of Revaluation                                                    12/07/2021  OT Start Time 1510   OT Stop Time 1605   OT Time Calculation (min) 45 min    Activity Tolerance Patient tolerated treatment well    Behavior During Therapy [facility] for tasks assessed/performed         .    Past Medical History:  Diagnosis Date   Diabetes mellitus (HCC)    Hypertension    Stroke [facility])    Past Surgical History:  Procedure Laterality Date   IVC FILTER PLACEMENT ([facility] HX)     LEG SURGERY     PEG TUBE PLACEMENT     TRACHEOSTOMY     Patient Active Problem List   Diagnosis Date Noted   Sepsis due to vancomycin resistant Enterococcus species (HCC) 06/06/2019   SIRS (systemic inflammatory response syndrome) (HCC) 06/05/2019   Acute lower UTI 06/05/2019   VRE (vancomycin-resistant Enterococci) infection 06/05/2019   Anemia 06/05/2019   Skin ulcer of sacrum with necrosis of muscle (HCC)    Urinary retention    Type 2 diabetes mellitus without complication, with long-term current use of insulin (HCC)    Tachycardia    Lower extremity edema    Acute metabolic encephalopathy    Obstructive sleep apnea    Morbid obesity with BMI of 60.0-69.9, adult (HCC)    Goals of care, counseling/discussion    Palliative care encounter    Sepsis (HCC) 04/27/2019   H/O insulin dependent diabetes mellitus 04/27/2019   History of CVA with residual deficit 04/27/2019   Seizure disorder (HCC) 04/27/2019   Decubitus ulcer of sacral region, stage 4 (HCC) 04/27/2019      REFERRING DIAG: CVA,  COVID-19  THERAPY DIAG:  Muscle weakness (generalized)  Rationale for Evaluation and Treatment Rehabilitation  PERTINENT HISTORY: Pt. is a 26 y.o. male who was hospitalized, and diagnosed with COVID-19, sustained a CVA with resultant quadreplegia in 01/2019. Pt. was hospitalized with VRE  UTI. Pt. PMHx includes: urinary retention, Seizure Disorder, Obstructive Sleep Disorder, DM Type II, and Morbid obesity. Pt. resides at home with his parents  PRECAUTIONS: No  SUBJECTIVE: Pt. Reports improved pain since having Botox  PAIN:  Are you having pain? No     OBJECTIVE:   TODAY'S TREATMENT:    Estim attended:   Pt. tolerated neuromuscular estim  for cycle time 10/10,  duty cycle 50%. Applied to the bilateral wrist, and digit extensors for 8 min. Each with the right intensity set to 44, and the left intensity set to 46, and constant monitoring 100% of the time. Manual assist was required for wrist and digit extension with each rep. To hold the position.    Therapeutic Exercise:   Pt. tolerated bilateral wrist flexion, extension, and MP, PIP, and DIP flexion, and extension without reports of pain this afternoon. Patient tolerated moist heat modality for 5 min. to his bilateral hands prior to range of motion.    Pt. continues to present with bilateral wrist, and digit flexor tightness, and reports having his next round of Botox next week. Pt. tolerated ROM, and EStim  well in the digits for wrist,a nd digit extension. Pt. continues to benefit from OT services  to improve ROM, and facilitate active volitional movement in order to work towards engaging his BUEs in daily ADLs, and IADL tasks.              PATIENT EDUCATION: Education details: BUE functioning, ROM Person educated: Patient and Caregiver Education method: Explanation, Demonstration, and Verbal cues Education comprehension: verbalized understanding, returned demonstration, verbal cues required, and needs further  education   HOME EXERCISE PROGRAM   BUE, hand, and digit ROM   OT Short Term Goals - 09/13/21 1641       OT SHORT TERM GOAL #1   Title Pt and caregiver will obtain splint and follow wearing schedule consistently 7 days/week    Baseline 09/14/21: working to obtain splint    Time 6    Period Weeks    Status New    Target Date 10/26/21              OT Long Term Goals - 09/13/21 1623       OT LONG TERM GOAL #1   Title Pt. will improve FOTO score by 5 points for clinically significant improvements during ADLs, and IADLs,    Baseline 30th visit: FOTO: 37, 21st: FOTO 43. 10th Visit: FOTO score 42 Eval: FOTO score: 41 FOTO score: 39; 50th: FOTO 52; 09/13/21: FOTO 41    Time 12    Period Weeks    Status On-going    Target Date 12/07/21      OT LONG TERM GOAL #2   Title Pt. will improve right shoulder flexion by 15 degrees to assist caregivers with UE dressing    Baseline Pt. continues to require assist with UE dressing secondary to limited ROM; 08/16/21: Max A-total A UB dressing; R shoulder flexion/abd combo 0-100; 09/13/21: R shoulder flexion/abd combo 0-110, R shoulder hiking noted    Time 12    Period Weeks    Status Revised    Target Date 12/07/21      OT LONG TERM GOAL #3   Title Pt. will improve right Abduction by 10 degrees to assist caregivers with underarm hygiene care.    Baseline Eval: AROM abduction RUE 76. Pt. continues to present with limited bilateral abduction. 21st: AROM abduction RUE 80.06/16/2021: Right shulder abduction 108. 06/23/2021: Right shoulder abduction: 110; 08/16/21: abd 110    Time 12    Period Weeks    Status Achieved    Target Date 09/08/21      OT LONG TERM GOAL #4  Title Pt. will demonstrate visual compensatory strategies for navigating through his environment.    Baseline 30th visit: Pt. continues to require assist to navigate through his environment. 20th: Pt continues to require assist to navigate environment, minimal knowledge of  strategies. Eval: Limited 06/23/2021: Pt. continues to be limited, and requires assist  with navigating through his environment; 08/16/21: father reports pt only steers his own wc in 1 open room of their house, otherwise dep to navigate environment. 09/13/21: limited steering outside of house, steers in 1 room of house. 09/13/21: pt seeing low vision OT specialist to address.    Time 12    Period Weeks    Status Deferred    Target Date 09/08/21      OT LONG TERM GOAL #5   Title Pt. will demonstrate visual compensatory strategies for tabletop tasks within his near space.    Baseline 06/16/2021: Pt. continues to present with visual limitations for tabletop tasks in his near space. Pt. continues to present with limited vision. 21st: Pt can identify objects on tabletop ~waterbottle sized, improves with contrasting colours, cannot ID small onjects; 08/16/21: no change from last assessment period. 09/13/21: pt seeing low vision OT specialist to address.    Time 12    Period Weeks    Status Deferred    Target Date 09/08/21      OT LONG TERM GOAL #6   Title Pt. will perform self feeding with SETUP using adaptive equipment    Baseline 06/23/2021: Pt. is able to use his bilateral hands to perfrom hand to mouth patterns for self-feeding with finger foods. Pt. is now able to use his left hand to bring a water bottle to his mouth,a nd drink from it. 30th visit: Pt. continues to present with weakness limiting functional hand to mouth patterns of movement. Pt. is able to perform hand to mouth patterns, however reports weakness in his hand  limits a secure grasp on the utensil. Pt. requires assist to perform; Eval: Pt. has difficulty; 08/16/21: pt continues to use L hand for self feeding finger foods with set up.  Spasticity limits dexterity to manipulate eating utensils. 09/13/21: can grip built up handle however requires MIN A 2/2 vision and wrist ROM deficits.    Time 12    Period Weeks    Status Revised    Target Date  12/07/21      OT LONG TERM GOAL #7   Title Pt. will perform one step self-grooming tasks with SETUP    Baseline 06/23/2021: Pt. is able to perform the hand to face pattern with the left hand, however has difficulty actively sustaining his BUEs in elevation long enough to perform self-grooming tasks. 06/16/2021: Pt. is able to perform the hand to face pattern with the left hand, however has difficulty actively sustaining his BUEs in elevation long enough to perform self-grooming tasks; 08/16/21: same as 06/16/21. 09/13/21: limited by trunk and sustaining BUEs    Time 12    Period Weeks    Status Revised    Target Date 12/07/21      OT LONG TERM GOAL #8   Title Pt will complete UB dressing tasks with SETUP and assist to thread over hands only    Baseline 06/23/2021: Pt. requires MinA to thread arms, and requires maxA to donn over upper arms, MaxAx2 to donn over his back. 06/16/2021: Pt. has progressed  to require MinA to thread arms, and requires maxA to donn over upper arms, MaxAx2 to donn over his  back; 08/16/21: min A to thread arms, max A to don over upper arms and over head. 09/13/21: MOD A    Time 12    Period Weeks    Status On-going    Target Date 12/07/21      OT LONG TERM GOAL  #9   TITLE Pt will improve B grip strength by 10# to assist in handling ADL items    Baseline 08/16/21: bilat girp 0#; 09/13/21: L grip 6#, R grip 9#    Time 12    Period Weeks    Status Revised    Target Date 12/06/21      OT LONG TERM GOAL  #10   TITLE Pt will improve B lateral pinch strength by 8# to assist in UBD.    Baseline N/A: awaiting pinch meter to be fixed. Pt. unable to formulate proper positioning for lateral pinch assessment of the replacement pinch meter; 08/16/21: R 2#, L 1.5#; ; 09/13/21: R 6#, L 2#    Time 12    Period Weeks    Status Revised    Target Date 12/07/21              Plan - 09/23/21 1416     Clinical Impression Statement Pt. continues to present with bilateral wrist, and digit  flexor tightness, and reports having his next round of Botox next week. Pt. tolerated ROM, and EStim  well in the digits for wrist,a nd digit extension. Pt. continues to benefit from OT services  to improve ROM, and facilitate active volitional movement in order to work towards engaging his BUEs in daily ADLs, and IADL tasks.               OT Occupational Profile and History Detailed Assessment- Review of Records and additional review of physical, cognitive, psychosocial history related to current functional performance    Occupational performance deficits (Please refer to evaluation for details): ADL's;IADL's    Body Structure / Function / Physical Skills ADL;FMC;ROM;Dexterity;IADL;Endurance;Obesity;Strength;UE functional use    Rehab Potential Good    Clinical Decision Making Multiple treatment options, significant modification of task necessary    Comorbidities Affecting Occupational Performance: Presence of comorbidities impacting occupational performance    Modification or Assistance to Complete Evaluation  Max significant modification of tasks or assist is necessary to complete    OT Frequency 2x / week    OT Duration 12 weeks    OT Treatment/Interventions Self-care/ADL training;Therapeutic exercise;Neuromuscular education;Visual/perceptual remediation/compensation;Patient/family education;Therapeutic activities;DME and/or AE instruction;Passive range of motion;Moist Heat;Electrical Stimulation    Consulted and Agree with Plan of Care Patient            Olegario Messier, MS, OTR/L   Olegario Messier, OT 10/12/2021, 5:10 PM

## 2021-10-12 NOTE — Therapy (Signed)
OUTPATIENT PHYSICAL THERAPY TREATMENT NOTE/Physical Therapy Progress Note   Dates of reporting period  08/16/2021  to   10/12/2021   Patient Name: Paul Hall MRN: [id number] DOB:06/19/1995, 26 y.o., male Today's Date: 10/13/2021  PCP:  Loistine Chance PROVIDER:  Collene Mares, PA-C   PT End of Session - 10/12/21 1704     Visit Number 50    Number of Visits 61    Date for PT Re-Evaluation 12/08/21    Authorization Type [employer] and [employer]- Need auth past 12th visit    Authorization Time Period 01/04/21-03/29/21; Recert 16/96/7893-11/11/1749; Recert 0/25/8527- 10/09/2421    Progress Note Due on Visit 15    PT Start Time 1516    PT Stop Time 1550    PT Time Calculation (min) 34 min    Equipment Utilized During Treatment Gait belt   Hoyer lift   Activity Tolerance Patient tolerated treatment well   queasiness/nausea   Behavior During Therapy WFL for tasks assessed/performed             Past Medical History:  Diagnosis Date   Diabetes mellitus ([place])    Hypertension    Stroke [facility])    Past Surgical History:  Procedure Laterality Date   IVC FILTER PLACEMENT ([place] [facility])     LEG SURGERY     PEG TUBE PLACEMENT     TRACHEOSTOMY     Patient Active Problem List   Diagnosis Date Noted   Sepsis due to vancomycin resistant Enterococcus species ([place]) 06/06/2019   SIRS (systemic inflammatory response syndrome) ([place]) 06/05/2019   Acute lower UTI 06/05/2019   VRE (vancomycin-resistant Enterococci) infection 06/05/2019   Anemia 06/05/2019   Skin ulcer of sacrum with necrosis of muscle ([place])    Urinary retention    Type 2 diabetes mellitus without complication, with long-term current use of insulin ([facility])    Tachycardia    Lower extremity edema    Acute metabolic encephalopathy    Obstructive sleep apnea    Morbid obesity with BMI of 60.0-69.9, adult ([place])    Goals of care, counseling/discussion    Palliative care encounter    Sepsis ([place]) 04/27/2019   H/O insulin  dependent diabetes mellitus 04/27/2019   History of CVA with residual deficit 04/27/2019   Seizure disorder ([place]) 04/27/2019   Decubitus ulcer of sacral region, stage 4 ([place]) 04/27/2019    REFERRING DIAG: Cerebral infarction, unspecified   THERAPY DIAG:  Muscle weakness (generalized)  Other lack of coordination  History of CVA with residual deficit  Difficulty in walking, not elsewhere classified  Unsteadiness on feet  Abnormality of gait and mobility  Other abnormalities of gait and mobility  Rationale for Evaluation and Treatment Rehabilitation  PERTINENT HISTORY: Paul Hall is a 25yoM who presents with severe weakness, quadriparesis, altered sensorium, and visual impairment s/p critical illness and prolonged hospitalization. Pt hospitalized in October 2020 with ARDS 2/2 COVID19 infection. Pt sustained a complex and lengthy hospitalization which included tracheostomy, prolonged sedation, ECMO. In this period pt sustained CVA and SDH. Pt has now been liberated from tracheostomy and G-tube. Pt has since been hospitalized for wound infection and UTI. Pt lives with parents at home, has hospital bed and left chair, hoyer lift transfers, and power WC for mobility needs. Pt needs heavy physical assistance with ADL 2/2 BUE contractures and motor dysfunction   PRECAUTIONS: Fall  SUBJECTIVE: Patient reports continuing to do well and denies any new issues  PAIN:  Are you having pain? No  TODAY'S TREATMENT:  Reassessed LTG- sitting at edge of mat for progress note: See goal section and below info for details.  Therapeutic Activities:   Patient was dependently transferred via [facility] from power lift to mat table. He was assisted to edge of mat via max assist of 2 people. He then proceeded to perform variety of seated activities.   Static sitting x multiple min. BUE - arm raises x 10 reps each Cross jab- BUE x 12 reps x 2 sets Lumbar ext (Min assist) to ab crunch forward x 15  reps (min A) Upper trunk twist Left to right x 15 reps each side.  B hip march (AAROM) x 15 reps each B knee ext (AROM) x 15 reps each  Total time sitting at edge of mat = 21 min.   Patient then assisted (hoyer) from mat back to power w/c with +2 person assist. Patient reported fatigue as limiting factor.    Education provided throughout session via VC/TC and demonstration to facilitate movement at target joints and correct muscle activation for all testing and exercises performed.       PATIENT EDUCATION: Education details: posture cues with sitting Person educated: Patient Education method: Explanation, Demonstration, Tactile cues, and Verbal cues Education comprehension: verbalized understanding, returned demonstration, verbal cues required, tactile cues required, and needs further education   HOME EXERCISE PROGRAM:    PT Short Term Goals -       PT SHORT TERM GOAL #1   Title Pt will be independent with HEP in order to improve strength and balance in order to decrease fall risk and improve function at home and work.    Baseline 01/04/2021= No formal HEP in place; 12/12 no HEP in place; 05/10/2021-Patient and his father were able to report compliance with curent HEP consisting of mostly seated/reclined LE strengthening. Both verbalize no questions at this time.    Time 6    Period Weeks    Status Achieved    Target Date 02/15/21              PT Long Term Goals -      PT LONG TERM GOAL #1   Title Patient will increase BLE gross strength by 1/2 muscle grade to improve functional strength for improved independence with potential gait, increased standing tolerance and increased ADL ability.    Baseline 01/04/2021- Patient presents with 1/5 to 3-/5 B LE strength with MMT; 12/12: goal partially met for Left knee/hip; 05/10/2021= 2-/5 bilateal Hip flex; 3+/5 bilateral Knee ext; 06/21/2021= Patient presents with 2-/5 bilateral Hip flex; 3+/5 bilateral knee ext/flex; 2-/5 left ankle  DF; 0/5 right ankle- and able to increase reps and resistance with LE's. 09/15/2021- Patient technically presents with 2-/5 B hip flex/abd/add - but he is able to raise his hip up to approx 100 deg which has improved. 3+/5 Bilateral knee ext, 2-/5 left ankle and 0/5 right ankle.    Time 12    Period Weeks    Status On-going    Target Date 12/08/21      PT LONG TERM GOAL #2   Title Patient will tolerate sitting unsupported demonstrating erect sitting posture for 15 minutes with CGA to demonstrate improved back extensor strength and improved sitting tolerance.    Baseline 01/04/2021- Patient confied to sitting in lift chair or electric power chair with back support and unable to sit upright without physical assistance; 12/12: tolerates <1 minutes upright unsupported sitting. 05/10/2021=static sit with forward trunk lean  in his power  wheelchair without back support x approx 3 min. 06/21/2021=Unable to assess today due to patient with acute back pain but on previous visit able to sit x 8 min without back support. 09/15/2021- on last visit- 09/13/2021- patient was able to sit unsupported x 8 min at edge of mat. 10/13/2023 - Patient was able to sit at edge of mat with varying level of assist today from SBA to min A for a total of 20 min.    Time 12    Period Weeks    Status On-going    Target Date 12/08/21      PT LONG TERM GOAL #3   Title Patient will demonstrate ability to perform static standing in // bars > 2 min with Max Assist  without loss of balance and fair posture for improved overall strength for pre-gait and transfer activities.    Baseline 01/04/2021= Patient current uanble to stand- Dependent on hoyer or sit to stand lift for transfers. 05/10/2021=Not appropriate yet- Currently still dependent with all transfers using hoyer. 06/21/2021= Patient continuing now to focus on LE strengthening to prepare for standing-unable to try today due to acute low back pain-  planning on attempting in new cert period.  09/15/2021- Patient has attempted standing 2x in past two week- max Assist of 2 people - only once was he successful to clearing his bottom from chair - Will continue to be a focus during the new certification.    Time 12    Period Weeks    Status On-going    Target Date 12/08/21      PT LONG TERM GOAL #4   Title Pt will improve FOTO score by 10 points or more demonstrating improved perceived functional ability    Baseline FOTO 7 on 10/17; 03/15/21: FOTO 12; 05/10/2021 06/21/2021= 1; 09/15/2021= 9    Time 12    Period Weeks    Status On-going    Target Date 12/08/21      PT LONG TERM GOAL #5   Title Patient will perform sit to stand transfer with appropriate AD and max assist of 2 people with 75% consistency to prepare for pregait activities.    Baseline 09/15/2021= Patient unable to stand well- unable to clear his bottom off chair with Max assist of 2 persons.    Time 12    Period Weeks    Status New    Target Date 12/08/21              Plan     Clinical Impression Statement Patient presents with good motivation for progress not visit. He was able to progress his overall sitting time to over 20 min today and stopped only due to overall fatigue. He was able to perform both static and some dynamic activity. Patient's condition has the potential to improve in response to therapy. Maximum improvement is yet to be obtained. The anticipated improvement is attainable and reasonable in a generally predictable time.  Patient reports  Patient would benefit from additional skilled PT intervention to improve strength, balance and mobility for improved quality of life.    Personal Factors and Comorbidities Comorbidity 3+;Time since onset of injury/illness/exacerbation    Comorbidities CVA, diabetes, Seizures    Examination-Activity Limitations Bathing;Bed Mobility;Bend;Caring for Others;Carry;Dressing;Hygiene/Grooming;Lift;Locomotion Level;Reach Overhead;Self  Feeding;Sit;Squat;Stairs;Stand;Transfers;Toileting    Examination-Participation Restrictions Cleaning;Community Activity;Driving;Laundry;Medication Management;Meal Prep;Occupation;Personal Finances;Shop;Yard Work;Volunteer    Stability/Clinical Decision Making Evolving/Moderate complexity    Rehab Potential Fair    PT Frequency 2x / week    PT Duration 12  weeks    PT Treatment/Interventions ADLs/Self Care Home Management;Cryotherapy;Electrical Stimulation;Moist Heat;Ultrasound;DME Instruction;Gait training;Stair training;Functional mobility training;Therapeutic exercise;Balance training;Patient/family education;Orthotic Fit/Training;Neuromuscular re-education;Wheelchair mobility training;Manual techniques;Passive range of motion;Dry needling;Energy conservation;Taping;Visual/perceptual remediation/compensation;Joint Manipulations    PT Next Visit Plan core strength- sitting at EOM, postural control, sabina sit to stand if able; Continue with progressive LE Strengthening    PT Home Exercise Plan No changes to HEP today    Consulted and Agree with Plan of Care Patient;Family member/caregiver    Family Member Consulted Dad               Lewis Moccasin, PT 10/13/2021, 3:51 PM

## 2021-10-14 ENCOUNTER — Ambulatory Visit: Payer: BC Managed Care – PPO

## 2021-10-18 ENCOUNTER — Ambulatory Visit (INDEPENDENT_AMBULATORY_CARE_PROVIDER_SITE_OTHER): Payer: BC Managed Care – PPO | Admitting: Dermatology

## 2021-10-18 DIAGNOSIS — L910 Hypertrophic scar: Secondary | ICD-10-CM | POA: Diagnosis not present

## 2021-10-18 DIAGNOSIS — R208 Other disturbances of skin sensation: Secondary | ICD-10-CM

## 2021-10-18 DIAGNOSIS — L732 Hidradenitis suppurativa: Secondary | ICD-10-CM

## 2021-10-18 MED ORDER — TRIAMCINOLONE ACETONIDE 0.1 % EX CREA: 1.0000 | TOPICAL_CREAM | Freq: Two times a day (BID) | CUTANEOUS | 2 refills | Status: AC | PRN

## 2021-10-18 MED ORDER — DOXYCYCLINE MONOHYDRATE 50 MG PO CAPS: 50.0000 mg | ORAL_CAPSULE | Freq: Every day | ORAL | 4 refills | Status: DC

## 2021-10-18 NOTE — Patient Instructions (Addendum)
Topical steroids (such as triamcinolone, fluocinolone, fluocinonide, mometasone, clobetasol, halobetasol, betamethasone, hydrocortisone) can cause thinning and lightening of the skin if they are used for too long in the same area. Your physician has selected the right strength medicine for your problem and area affected on the body. Please use your medication only as directed by your physician to prevent side effects.    Hidradenitis Suppurativa is a chronic; persistent; non-curable, but treatable condition due to abnormal inflamed sweat glands in the body folds (axilla, inframammary, groin, medial thighs), causing recurrent painful draining cysts and scarring. It can be associated with severe scarring acne and cysts; also abscesses and scarring of scalp. The goal is control and prevention of flares, as it is not curable. Scars are permanent and can be thickened. Treatment may include daily use of topical medication and oral antibiotics.  Oral isotretinoin may also be helpful.  For more severe cases, Humira (a biologic injection) may be prescribed to decrease the inflammatory process and prevent flares.  When indicated, inflamed cysts may also be treated surgically.   Doxycycline should be taken with food to prevent nausea. Do not lay down for 30 minutes after taking. Be cautious with sun exposure and use good sun protection while on this medication. Pregnant women should not take this medication.    Due to recent changes in healthcare laws, you may see results of your pathology and/or laboratory studies on MyChart before the doctors have had a chance to review them. We understand that in some cases there may be results that are confusing or concerning to you. Please understand that not all results are received at the same time and often the doctors may need to interpret multiple results in order to provide you with the best plan of care or course of treatment. Therefore, we ask that you please give [place] 2  business days to thoroughly review all your results before contacting the office for clarification. Should we see a critical lab result, you will be contacted sooner.   If You Need Anything After Your Visit  If you have any questions or concerns for your doctor, please call our main line at [phone] and press option 4 to reach your doctor's medical assistant. If no one answers, please leave a voicemail as directed and we will return your call as soon as possible. Messages left after 4 pm will be answered the following business day.   You may also send [place] a message via MyChart. We typically respond to MyChart messages within 1-2 business days.  For prescription refills, please ask your pharmacy to contact our office. Our fax number is [phone].  If you have an urgent issue when the clinic is closed that cannot wait until the next business day, you can page your doctor at the number below.    Please note that while we do our best to be available for urgent issues outside of office hours, we are not available 24/7.   If you have an urgent issue and are unable to reach [place], you may choose to seek medical care at your doctor's office, retail clinic, urgent care center, or emergency room.  If you have a medical emergency, please immediately call 911 or go to the emergency department.  Pager Numbers  - Dr. Gwen Pounds: [phone]  - Dr. Neale Burly: [phone]  - Dr. Roseanne Reno: [phone]  In the event of inclement weather, please call our main line at [phone] for an update on the status of any delays or closures.  Dermatology Medication Tips: Please keep the boxes that topical medications come in in order to help keep track of the instructions about where and how to use these. Pharmacies typically print the medication instructions only on the boxes and not directly on the medication tubes.   If your medication is too expensive, please contact our office at [phone] option 4 or  send [place] a message through MyChart.   We are unable to tell what your co-pay for medications will be in advance as this is different depending on your insurance coverage. However, we may be able to find a substitute medication at lower cost or fill out paperwork to get insurance to cover a needed medication.   If a prior authorization is required to get your medication covered by your insurance company, please allow [place] 1-2 business days to complete this process.  Drug prices often vary depending on where the prescription is filled and some pharmacies may offer cheaper prices.  The website www.goodrx.com contains coupons for medications through different pharmacies. The prices here do not account for what the cost may be with help from insurance (it may be cheaper with your insurance), but the website can give you the price if you did not use any insurance.  - You can print the associated coupon and take it with your prescription to the pharmacy.  - You may also stop by our office during regular business hours and pick up a GoodRx coupon card.  - If you need your prescription sent electronically to a different pharmacy, notify our office through [facility] or by phone at [phone] option 4.     Si Usted Necesita Algo Despus de Su Visita  Tambin puede enviarnos un mensaje a travs de Clinical cytogeneticist. Por lo general respondemos a los mensajes de MyChart en el transcurso de 1 a 2 das hbiles.  Para renovar recetas, por favor pida a su farmacia que se ponga en contacto con nuestra oficina. Annie Sable de fax es [place] [phone].  Si tiene un asunto urgente cuando la clnica est cerrada y que no puede esperar hasta el siguiente da hbil, puede llamar/localizar a su doctor(a) al nmero que aparece a continuacin.   Por favor, tenga en cuenta que aunque hacemos todo lo posible para estar disponibles para asuntos urgentes fuera del horario de [place], no estamos disponibles las 24 horas del  da, los 7 [address]  [address] de la [place].   Si tiene un problema urgente y no puede comunicarse con nosotros, puede optar por buscar atencin mdica  en el consultorio de su doctor(a), en una clnica privada, en un centro de atencin urgente o en una sala de emergencias.  Si tiene Engineer, drilling, por favor llame inmediatamente al 911 o vaya a la sala de emergencias.  Nmeros de bper  - Dr. Gwen Pounds: [phone]  - Dra. Moye: [phone]  - Dra. Roseanne Reno: [phone]  En caso de inclemencias del [place], por favor llame a Lacy Duverney principal al [phone] para una actualizacin sobre el [place] de cualquier retraso o cierre.  Consejos para la medicacin en dermatologa: Por favor, guarde las cajas en las que vienen los medicamentos de uso tpico para ayudarle a seguir las instrucciones sobre dnde y cmo usarlos. Las farmacias generalmente imprimen las instrucciones del medicamento slo en las cajas y no directamente en los tubos del [place].   Si su medicamento es muy caro, por favor, pngase en contacto con Rolm Gala llamando al [phone] y presione la opcin 4 o envenos un  mensaje a travs de MyChart.   No podemos decirle cul ser su copago por los medicamentos por adelantado ya que esto es diferente dependiendo de la cobertura de su seguro. Sin embargo, es posible que podamos encontrar un medicamento sustituto a Audiological scientist un formulario para que el seguro cubra el medicamento que se considera necesario.   Si se requiere una autorizacin previa para que su compaa de seguros [place] su medicamento, por favor permtanos de 1 a 2 das hbiles para completar [address]  Los precios de los medicamentos varan con frecuencia dependiendo del Environmental consultant de dnde se surte la receta y alguna farmacias pueden ofrecer precios ms baratos.  El sitio web www.goodrx.com tiene cupones para medicamentos de Health and safety inspector. Los precios aqu no tienen en cuenta lo que podra  costar con la ayuda del seguro (puede ser ms barato con su seguro), pero el sitio web puede darle el precio si no utiliz Tourist information centre manager.  - Puede imprimir el cupn correspondiente y llevarlo con su receta a la farmacia.  - Tambin puede pasar por nuestra oficina durante el horario de atencin regular y Education officer, museum una tarjeta de cupones de GoodRx.  - Si necesita que su receta se enve electrnicamente a una farmacia diferente, informe a nuestra oficina a travs de MyChart de [place] o por telfono llamando al [phone] y presione la opcin 4.

## 2021-10-18 NOTE — Progress Notes (Signed)
New Patient Visit  Subjective  Paul Hall is a 26 y.o. male who presents for the following: New Patient (Initial Visit) (Patient here with parents concerning keloid at left ear that burns and stings. Mother reports trauma at left ear and when ear healed it formed a thickened bump that sometimes causes patient pain and discomfort. Painful bumps under arms that mother would like checked.).  The following portions of the chart were reviewed this encounter and updated as appropriate:   Tobacco  Allergies  Meds  Problems  Med Hx  Surg Hx  Fam Hx     Review of Systems:  No other skin or systemic complaints except as noted in HPI or Assessment and Plan.  Objective  Well appearing patient in no apparent distress; mood and affect are within normal limits.  A focused examination was performed including b/l axilla, left ear. Relevant physical exam findings are noted in the Assessment and Plan.  b/l axillary Hyperpigmentation with some scarring at axilla   Left Ear 2.5 x 1.5 cm   See Photos         Assessment & Plan  Hidradenitis suppurativa b/l axillary Hidradenitis Suppurativa is a chronic; persistent; non-curable, but treatable condition due to abnormal inflamed sweat glands in the body folds (axilla, inframammary, groin, medial thighs), causing recurrent painful draining cysts and scarring. It can be associated with severe scarring acne and cysts; also abscesses and scarring of scalp. The goal is control and prevention of flares, as it is not curable. Scars are permanent and can be thickened. Treatment may include daily use of topical medication and oral antibiotics.  Oral isotretinoin may also be helpful.  For more severe cases, Humira (a biologic injection) may be prescribed to decrease the inflammatory process and prevent flares.  When indicated, inflamed cysts may also be treated surgically.  Pamphlet information given to mother  Start doxycycline 50 mg capsule -  take 1 cap po qd with food and drink. Doxycycline should be taken with food to prevent nausea. Do not lay down for 30 minutes after taking. Be cautious with sun exposure and use good sun protection while on this medication. Pregnant women should not take this medication.   Will recheck at next follow up  doxycycline (MONODOX) 50 MG capsule - b/l axillary Take 1 capsule (50 mg total) by mouth daily. Take with food and drink. Do not lay down for 30 minutes after taking. Be cautious with sun exposure and use good sun protection while on this medication.  Keloid Left Ear Symptomatic - pt wants treatment. History of necrosis at left ear during hospitalization for Covid which resulted in Keloid with healing.  Discussed different treatment options  We will initiate topical treatment.  Informed topical treatment, and any other treatment whether topical, injectable, or surgical, will not cure keloid but will only help with symptoms and appearance.  No matter what treatment is performed on the keloid, it may recur even if it improves.  Start Triamcinolone cream 0.1 % - apply bid prn at left earlobe 5 x weekly. Avoid applying to face, groin, and axilla. Use as directed. Long-term use can cause thinning of the skin.  Topical steroids (such as triamcinolone, fluocinolone, fluocinonide, mometasone, clobetasol, halobetasol, betamethasone, hydrocortisone) can cause thinning and lightening of the skin if they are used for too long in the same area. Your physician has selected the right strength medicine for your problem and area affected on the body. Please use your medication only as directed  by your physician to prevent side effects.   triamcinolone cream (KENALOG) 0.1 % - Left Ear Apply 1 Application topically 2 (two) times daily as needed (Rash). Use at left earlobe 5 x weekly. Avoid applying to face, groin, and axilla. Use as directed.  Return for 4 month HS and Keloid follow up.  IAsher Muir,  CMA, am acting as scribe for Armida Sans, MD. Documentation: I have reviewed the above documentation for accuracy and completeness, and I agree with the above.  Armida Sans, MD

## 2021-10-21 ENCOUNTER — Ambulatory Visit: Payer: BC Managed Care – PPO

## 2021-10-22 DIAGNOSIS — G4733 Obstructive sleep apnea (adult) (pediatric): Secondary | ICD-10-CM | POA: Diagnosis not present

## 2021-10-25 ENCOUNTER — Ambulatory Visit: Payer: BC Managed Care – PPO

## 2021-10-25 DIAGNOSIS — I6930 Unspecified sequelae of cerebral infarction: Secondary | ICD-10-CM

## 2021-10-25 DIAGNOSIS — R2681 Unsteadiness on feet: Secondary | ICD-10-CM | POA: Diagnosis not present

## 2021-10-25 DIAGNOSIS — R262 Difficulty in walking, not elsewhere classified: Secondary | ICD-10-CM

## 2021-10-25 DIAGNOSIS — M6281 Muscle weakness (generalized): Secondary | ICD-10-CM

## 2021-10-25 DIAGNOSIS — R278 Other lack of coordination: Secondary | ICD-10-CM | POA: Diagnosis not present

## 2021-10-25 DIAGNOSIS — R2689 Other abnormalities of gait and mobility: Secondary | ICD-10-CM | POA: Diagnosis not present

## 2021-10-25 DIAGNOSIS — R269 Unspecified abnormalities of gait and mobility: Secondary | ICD-10-CM | POA: Diagnosis not present

## 2021-10-25 NOTE — Therapy (Signed)
OUTPATIENT PHYSICAL THERAPY TREATMENT NOTE   Patient Name: Paul Hall MRN: [id number] DOB:Oct 09, 1995, 26 y.o., male Today's Date: 10/26/2021  PCP:  Cindi Carbon PROVIDER:  Lenise Herald, PA-C   PT End of Session - 10/25/21 1514     Visit Number 51    Number of Visits 61    Date for PT Re-Evaluation 12/08/21    Authorization Type [employer] and [employer]- Need auth past 12th visit    Authorization Time Period 01/04/21-03/29/21; Recert 03/24/2021-06/16/2021; Recert 09/15/2021- 12/08/2021    Progress Note Due on Visit 50    PT Start Time 1515    PT Stop Time 1559    PT Time Calculation (min) 44 min    Equipment Utilized During Treatment Gait belt   Hoyer lift   Activity Tolerance Patient tolerated treatment well   queasiness/nausea   Behavior During Therapy WFL for tasks assessed/performed             Past Medical History:  Diagnosis Date   Diabetes mellitus (HCC)    Hypertension    Stroke [facility])    Past Surgical History:  Procedure Laterality Date   IVC FILTER PLACEMENT ([facility] HX)     LEG SURGERY     PEG TUBE PLACEMENT     TRACHEOSTOMY     Patient Active Problem List   Diagnosis Date Noted   Sepsis due to vancomycin resistant Enterococcus species (HCC) 06/06/2019   SIRS (systemic inflammatory response syndrome) (HCC) 06/05/2019   Acute lower UTI 06/05/2019   VRE (vancomycin-resistant Enterococci) infection 06/05/2019   Anemia 06/05/2019   Skin ulcer of sacrum with necrosis of muscle (HCC)    Urinary retention    Type 2 diabetes mellitus without complication, with long-term current use of insulin (HCC)    Tachycardia    Lower extremity edema    Acute metabolic encephalopathy    Obstructive sleep apnea    Morbid obesity with BMI of 60.0-69.9, adult (HCC)    Goals of care, counseling/discussion    Palliative care encounter    Sepsis (HCC) 04/27/2019   H/O insulin dependent diabetes mellitus 04/27/2019   History of CVA with residual deficit 04/27/2019    Seizure disorder (HCC) 04/27/2019   Decubitus ulcer of sacral region, stage 4 (HCC) 04/27/2019    REFERRING DIAG: Cerebral infarction, unspecified   THERAPY DIAG:  Muscle weakness (generalized)  Other lack of coordination  History of CVA with residual deficit  Difficulty in walking, not elsewhere classified  Unsteadiness on feet  Abnormality of gait and mobility  Rationale for Evaluation and Treatment Rehabilitation  PERTINENT HISTORY: Paul Hall is a 25yoM who presents with severe weakness, quadriparesis, altered sensorium, and visual impairment s/p critical illness and prolonged hospitalization. Pt hospitalized in October 2020 with ARDS 2/2 COVID19 infection. Pt sustained a complex and lengthy hospitalization which included tracheostomy, prolonged sedation, ECMO. In this period pt sustained CVA and SDH. Pt has now been liberated from tracheostomy and G-tube. Pt has since been hospitalized for wound infection and UTI. Pt lives with parents at home, has hospital bed and left chair, hoyer lift transfers, and power WC for mobility needs. Pt needs heavy physical assistance with ADL 2/2 BUE contractures and motor dysfunction   PRECAUTIONS: Fall  SUBJECTIVE: Patient reports having issues with IVC filter and going to have to see a specialist. He denies any other problems. States his Dad has been helping him exercise.  PAIN:  Are you having pain? No     TODAY'S TREATMENT:  Therapeutic Exercises:  Seated hip march- 3 sets of 15 reps each LE Seated Knee ext- 3 sets of 15 reps each LE Seated hip abd (manual resistance) - hold 5 sec 2sets x 10 reps each LE Seated Hip add squeeze (hold 5 sec) - 2 sets of 10 reps each LE Seated ham set (hold 5 sec) - 2 sets of 10 reps each LE Seated Gluteal Sets (hold 5 sec) - 2 sets of 10 reps each LE  PROM- Hamstring stretch BLE x 30 sec x 4 each LE   Education provided throughout session via VC/TC and demonstration to facilitate movement  at target joints and correct muscle activation for all testing and exercises performed.       PATIENT EDUCATION: Education details: posture cues with sitting Person educated: Patient Education method: Explanation, Demonstration, Tactile cues, and Verbal cues Education comprehension: verbalized understanding, returned demonstration, verbal cues required, tactile cues required, and needs further education   HOME EXERCISE PROGRAM:    PT Short Term Goals -       PT SHORT TERM GOAL #1   Title Pt will be independent with HEP in order to improve strength and balance in order to decrease fall risk and improve function at home and work.    Baseline 01/04/2021= No formal HEP in place; 12/12 no HEP in place; 05/10/2021-Patient and his father were able to report compliance with curent HEP consisting of mostly seated/reclined LE strengthening. Both verbalize no questions at this time.    Time 6    Period Weeks    Status Achieved    Target Date 02/15/21              PT Long Term Goals -      PT LONG TERM GOAL #1   Title Patient will increase BLE gross strength by 1/2 muscle grade to improve functional strength for improved independence with potential gait, increased standing tolerance and increased ADL ability.    Baseline 01/04/2021- Patient presents with 1/5 to 3-/5 B LE strength with MMT; 12/12: goal partially met for Left knee/hip; 05/10/2021= 2-/5 bilateal Hip flex; 3+/5 bilateral Knee ext; 06/21/2021= Patient presents with 2-/5 bilateral Hip flex; 3+/5 bilateral knee ext/flex; 2-/5 left ankle DF; 0/5 right ankle- and able to increase reps and resistance with LE's. 09/15/2021- Patient technically presents with 2-/5 B hip flex/abd/add - but he is able to raise his hip up to approx 100 deg which has improved. 3+/5 Bilateral knee ext, 2-/5 left ankle and 0/5 right ankle.    Time 12    Period Weeks    Status On-going    Target Date 12/08/21      PT LONG TERM GOAL #2   Title Patient will  tolerate sitting unsupported demonstrating erect sitting posture for 15 minutes with CGA to demonstrate improved back extensor strength and improved sitting tolerance.    Baseline 01/04/2021- Patient confied to sitting in lift chair or electric power chair with back support and unable to sit upright without physical assistance; 12/12: tolerates <1 minutes upright unsupported sitting. 05/10/2021=static sit with forward trunk lean  in his power wheelchair without back support x approx 3 min. 06/21/2021=Unable to assess today due to patient with acute back pain but on previous visit able to sit x 8 min without back support. 09/15/2021- on last visit- 09/13/2021- patient was able to sit unsupported x 8 min at edge of mat. 10/13/2023 - Patient was able to sit at edge of mat with varying level of assist today from SBA  to min A for a total of 20 min.    Time 12    Period Weeks    Status On-going    Target Date 12/08/21      PT LONG TERM GOAL #3   Title Patient will demonstrate ability to perform static standing in // bars > 2 min with Max Assist  without loss of balance and fair posture for improved overall strength for pre-gait and transfer activities.    Baseline 01/04/2021= Patient current uanble to stand- Dependent on hoyer or sit to stand lift for transfers. 05/10/2021=Not appropriate yet- Currently still dependent with all transfers using hoyer. 06/21/2021= Patient continuing now to focus on LE strengthening to prepare for standing-unable to try today due to acute low back pain-  planning on attempting in new cert period. 09/15/2021- Patient has attempted standing 2x in past two week- max Assist of 2 people - only once was he successful to clearing his bottom from chair - Will continue to be a focus during the new certification.    Time 12    Period Weeks    Status On-going    Target Date 12/08/21      PT LONG TERM GOAL #4   Title Pt will improve FOTO score by 10 points or more demonstrating improved perceived  functional ability    Baseline FOTO 7 on 10/17; 03/15/21: FOTO 12; 05/10/2021 06/21/2021= 1; 09/15/2021= 9    Time 12    Period Weeks    Status On-going    Target Date 12/08/21      PT LONG TERM GOAL #5   Title Patient will perform sit to stand transfer with appropriate AD and max assist of 2 people with 75% consistency to prepare for pregait activities.    Baseline 09/15/2021= Patient unable to stand well- unable to clear his bottom off chair with Max assist of 2 persons.    Time 12    Period Weeks    Status New    Target Date 12/08/21              Plan     Clinical Impression Statement Treatment limited to LE strengthening secondary to patient  having issues with IVC filter that will require surgical intervention. He responded well to treatment and to increase reps/sets with therex and less Physical assist today. He demo fatigue but no other issues today. Patient would benefit from additional skilled PT intervention to improve strength, balance and mobility for improved quality of life.    Personal Factors and Comorbidities Comorbidity 3+;Time since onset of injury/illness/exacerbation    Comorbidities CVA, diabetes, Seizures    Examination-Activity Limitations Bathing;Bed Mobility;Bend;Caring for Others;Carry;Dressing;Hygiene/Grooming;Lift;Locomotion Level;Reach Overhead;Self Feeding;Sit;Squat;Stairs;Stand;Transfers;Toileting    Examination-Participation Restrictions Cleaning;Community Activity;Driving;Laundry;Medication Management;Meal Prep;Occupation;Personal Finances;Shop;Yard Work;Volunteer    Stability/Clinical Decision Making Evolving/Moderate complexity    Rehab Potential Fair    PT Frequency 2x / week    PT Duration 12 weeks    PT Treatment/Interventions ADLs/Self Care Home Management;Cryotherapy;Electrical Stimulation;Moist Heat;Ultrasound;DME Instruction;Gait training;Stair training;Functional mobility training;Therapeutic exercise;Balance training;Patient/family  education;Orthotic Fit/Training;Neuromuscular re-education;Wheelchair mobility training;Manual techniques;Passive range of motion;Dry needling;Energy conservation;Taping;Visual/perceptual remediation/compensation;Joint Manipulations    PT Next Visit Plan core strength- sitting at EOM, postural control, sabina sit to stand if able; Continue with progressive LE Strengthening    PT Home Exercise Plan No changes to HEP today    Consulted and Agree with Plan of Care Patient;Family member/caregiver    Family Member Consulted Dad  Lenda Kelp, PT 10/26/2021, 10:16 PM

## 2021-10-25 NOTE — Therapy (Signed)
OUTPATIENT OCCUPATIONAL THERAPY TREATMENT NOTE   Patient Name: Paul Hall MRN: [id number] DOB:[birth year], 26 y.o., male Today's Date: 10/25/2021  REFERRING PROVIDER:  Lenise Herald, MD .[email or web address]   OT End of Session -        Visit number                                                                61      Number of visits                                                          96      Date of Revaluation                                                    12/07/2021  OT Start Time 1510   OT Stop Time 1605   OT Time Calculation (min) 45 min    Activity Tolerance Patient tolerated treatment well    Behavior During Therapy [facility] for tasks assessed/performed         .    Past Medical History:  Diagnosis Date   Diabetes mellitus (HCC)    Hypertension    Stroke [facility])    Past Surgical History:  Procedure Laterality Date   IVC FILTER PLACEMENT ([facility] HX)     LEG SURGERY     PEG TUBE PLACEMENT     TRACHEOSTOMY     Patient Active Problem List   Diagnosis Date Noted   Sepsis due to vancomycin resistant Enterococcus species (HCC) 06/06/2019   SIRS (systemic inflammatory response syndrome) (HCC) 06/05/2019   Acute lower UTI 06/05/2019   VRE (vancomycin-resistant Enterococci) infection 06/05/2019   Anemia 06/05/2019   Skin ulcer of sacrum with necrosis of muscle (HCC)    Urinary retention    Type 2 diabetes mellitus without complication, with long-term current use of insulin (HCC)    Tachycardia    Lower extremity edema    Acute metabolic encephalopathy    Obstructive sleep apnea    Morbid obesity with BMI of 60.0-69.9, adult (HCC)    Goals of care, counseling/discussion    Palliative care encounter    Sepsis (HCC) 04/27/2019   H/O insulin dependent diabetes mellitus 04/27/2019   History of CVA with residual deficit 04/27/2019   Seizure disorder (HCC) 04/27/2019   Decubitus ulcer of sacral region, stage 4 (HCC) 04/27/2019      REFERRING DIAG: CVA,  COVID-19  THERAPY DIAG:  Muscle weakness (generalized)  Other lack of coordination  History of CVA with residual deficit  Rationale for Evaluation and Treatment Rehabilitation  PERTINENT HISTORY: Pt. is a 26 y.o. male who was hospitalized, and diagnosed with COVID-19, sustained a CVA with resultant quadreplegia in 01/2019. Pt. was hospitalized with VRE  UTI. Pt. PMHx includes: urinary retention, Seizure Disorder, Obstructive Sleep Disorder, DM Type II, and Morbid obesity. Pt. resides at home with his parents  PRECAUTIONS:  No  SUBJECTIVE: Pt reports his therapy appointments were supposed to be cancelled last week due an appointment at [facility].    PAIN:  Are you having pain? No pain at rest, 5-6/10 pain in bilat hands with passive stretching at end range.  OBJECTIVE:   TODAY'S TREATMENT:     Therapeutic Exercise: Pt. tolerated bilateral wrist flexion, extension, and MP, PIP, and DIP flexion, and extension with reports of 5-6/10 pain at end range ext this afternoon. Pt reports he feels like his Botox is wearing off, but that he has another appointment coming up in Aug.  Patient tolerated moist heat modality for 5 min. to his bilateral hands prior to range of motion.   Neuro re-ed: Facilitated grasp/release/reaching movements with R/LUEs working to move rings between 1st and 2nd levels of [employer].  Pt requiring min A to place rings between L thumb and IF in prep for placement onto alternate level, but was mostly indep to move the rings over in each direction once ring was secured on each level.  Pt required min-mod A with the RUE to move rings back and forth on the 1st level, and mod A on the 2nd level.  Pt used a flexed hand to bat the rings off each level.   PATIENT EDUCATION: Education details: BUE functioning, ROM Person educated: Patient and Caregiver Education method: Explanation, Demonstration, and Verbal cues Education comprehension: verbalized understanding, returned  demonstration, verbal cues required, and needs further education   HOME EXERCISE PROGRAM   BUE, hand, and digit ROM   OT Short Term Goals - 09/13/21 1641       OT SHORT TERM GOAL #1   Title Pt and caregiver will obtain splint and follow wearing schedule consistently 7 days/week    Baseline 09/14/21: working to obtain splint    Time 6    Period Weeks    Status New    Target Date 10/26/21              OT Long Term Goals - 09/13/21 1623       OT LONG TERM GOAL #1   Title Pt. will improve FOTO score by 5 points for clinically significant improvements during ADLs, and IADLs,    Baseline 30th visit: FOTO: 37, 21st: FOTO 43. 10th Visit: FOTO score 42 Eval: FOTO score: 41 FOTO score: 39; 50th: FOTO 52; 09/13/21: FOTO 41    Time 12    Period Weeks    Status On-going    Target Date 12/07/21      OT LONG TERM GOAL #2   Title Pt. will improve right shoulder flexion by 15 degrees to assist caregivers with UE dressing    Baseline Pt. continues to require assist with UE dressing secondary to limited ROM; 08/16/21: Max A-total A UB dressing; R shoulder flexion/abd combo 0-100; 09/13/21: R shoulder flexion/abd combo 0-110, R shoulder hiking noted    Time 12    Period Weeks    Status Revised    Target Date 12/07/21      OT LONG TERM GOAL #3   Title Pt. will improve right Abduction by 10 degrees to assist caregivers with underarm hygiene care.    Baseline Eval: AROM abduction RUE 76. Pt. continues to present with limited bilateral abduction. 21st: AROM abduction RUE 80.06/16/2021: Right shulder abduction 108. 06/23/2021: Right shoulder abduction: 110; 08/16/21: abd 110    Time 12    Period Weeks    Status Achieved    Target Date 09/08/21  OT LONG TERM GOAL #4   Title Pt. will demonstrate visual compensatory strategies for navigating through his environment.    Baseline 30th visit: Pt. continues to require assist to navigate through his environment. 20th: Pt continues to require assist  to navigate environment, minimal knowledge of strategies. Eval: Limited 06/23/2021: Pt. continues to be limited, and requires assist  with navigating through his environment; 08/16/21: father reports pt only steers his own wc in 1 open room of their house, otherwise dep to navigate environment. 09/13/21: limited steering outside of house, steers in 1 room of house. 09/13/21: pt seeing low vision OT specialist to address.    Time 12    Period Weeks    Status Deferred    Target Date 09/08/21      OT LONG TERM GOAL #5   Title Pt. will demonstrate visual compensatory strategies for tabletop tasks within his near space.    Baseline 06/16/2021: Pt. continues to present with visual limitations for tabletop tasks in his near space. Pt. continues to present with limited vision. 21st: Pt can identify objects on tabletop ~waterbottle sized, improves with contrasting colours, cannot ID small onjects; 08/16/21: no change from last assessment period. 09/13/21: pt seeing low vision OT specialist to address.    Time 12    Period Weeks    Status Deferred    Target Date 09/08/21      OT LONG TERM GOAL #6   Title Pt. will perform self feeding with SETUP using adaptive equipment    Baseline 06/23/2021: Pt. is able to use his bilateral hands to perfrom hand to mouth patterns for self-feeding with finger foods. Pt. is now able to use his left hand to bring a water bottle to his mouth,a nd drink from it. 30th visit: Pt. continues to present with weakness limiting functional hand to mouth patterns of movement. Pt. is able to perform hand to mouth patterns, however reports weakness in his hand  limits a secure grasp on the utensil. Pt. requires assist to perform; Eval: Pt. has difficulty; 08/16/21: pt continues to use L hand for self feeding finger foods with set up.  Spasticity limits dexterity to manipulate eating utensils. 09/13/21: can grip built up handle however requires MIN A 2/2 vision and wrist ROM deficits.    Time 12     Period Weeks    Status Revised    Target Date 12/07/21      OT LONG TERM GOAL #7   Title Pt. will perform one step self-grooming tasks with SETUP    Baseline 06/23/2021: Pt. is able to perform the hand to face pattern with the left hand, however has difficulty actively sustaining his BUEs in elevation long enough to perform self-grooming tasks. 06/16/2021: Pt. is able to perform the hand to face pattern with the left hand, however has difficulty actively sustaining his BUEs in elevation long enough to perform self-grooming tasks; 08/16/21: same as 06/16/21. 09/13/21: limited by trunk and sustaining BUEs    Time 12    Period Weeks    Status Revised    Target Date 12/07/21      OT LONG TERM GOAL #8   Title Pt will complete UB dressing tasks with SETUP and assist to thread over hands only    Baseline 06/23/2021: Pt. requires MinA to thread arms, and requires maxA to donn over upper arms, MaxAx2 to donn over his back. 06/16/2021: Pt. has progressed  to require MinA to thread arms, and requires maxA to donn over  upper arms, MaxAx2 to donn over his back; 08/16/21: min A to thread arms, max A to don over upper arms and over head. 09/13/21: MOD A    Time 12    Period Weeks    Status On-going    Target Date 12/07/21      OT LONG TERM GOAL  #9   TITLE Pt will improve B grip strength by 10# to assist in handling ADL items    Baseline 08/16/21: bilat girp 0#; 09/13/21: L grip 6#, R grip 9#    Time 12    Period Weeks    Status Revised    Target Date 12/06/21      OT LONG TERM GOAL  #10   TITLE Pt will improve B lateral pinch strength by 8# to assist in UBD.    Baseline N/A: awaiting pinch meter to be fixed. Pt. unable to formulate proper positioning for lateral pinch assessment of the replacement pinch meter; 08/16/21: R 2#, L 1.5#; ; 09/13/21: R 6#, L 2#    Time 12    Period Weeks    Status Revised    Target Date 12/07/21              Plan - 09/23/21 1416     Clinical Impression Statement Pt  states that he feels like his Botox is wearing off, but that he has another appointment coming up in August for additional injections to manage his tone.  Pt reported 5-6/10 pain at end range digit ext stretch, and 0/10 pain at rest this date.  Good tolerance with reaching activities using [employer] tower.  Pt required min A on the L and mod A on the R to move rings between 1st and 2nd levels.  Pt  continues to benefit from OT services to improve ROM, and facilitate active volitional movement in order to work towards engaging his BUEs in daily ADLs, and IADL tasks.     OT Occupational Profile and History Detailed Assessment- Review of Records and additional review of physical, cognitive, psychosocial history related to current functional performance    Occupational performance deficits (Please refer to evaluation for details): ADL's;IADL's    Body Structure / Function / Physical Skills ADL;FMC;ROM;Dexterity;IADL;Endurance;Obesity;Strength;UE functional use    Rehab Potential Good    Clinical Decision Making Multiple treatment options, significant modification of task necessary    Comorbidities Affecting Occupational Performance: Presence of comorbidities impacting occupational performance    Modification or Assistance to Complete Evaluation  Max significant modification of tasks or assist is necessary to complete    OT Frequency 2x / week    OT Duration 12 weeks    OT Treatment/Interventions Self-care/ADL training;Therapeutic exercise;Neuromuscular education;Visual/perceptual remediation/compensation;Patient/family education;Therapeutic activities;DME and/or AE instruction;Passive range of motion;Moist Heat;Electrical Stimulation    Consulted and Agree with Plan of Care Patient            Leta Speller, MS, OTR/L   Darleene Cleaver, OT 10/25/2021, 3:43 PM

## 2021-10-26 DIAGNOSIS — I6389 Other cerebral infarction: Secondary | ICD-10-CM | POA: Diagnosis not present

## 2021-10-26 DIAGNOSIS — L8994 Pressure ulcer of unspecified site, stage 4: Secondary | ICD-10-CM | POA: Diagnosis not present

## 2021-10-26 DIAGNOSIS — R32 Unspecified urinary incontinence: Secondary | ICD-10-CM | POA: Diagnosis not present

## 2021-10-26 DIAGNOSIS — I824Z9 Acute embolism and thrombosis of unspecified deep veins of unspecified distal lower extremity: Secondary | ICD-10-CM | POA: Diagnosis not present

## 2021-10-26 DIAGNOSIS — Z6841 Body Mass Index (BMI) 40.0 and over, adult: Secondary | ICD-10-CM | POA: Diagnosis not present

## 2021-10-27 ENCOUNTER — Ambulatory Visit: Payer: BC Managed Care – PPO

## 2021-10-27 DIAGNOSIS — I6930 Unspecified sequelae of cerebral infarction: Secondary | ICD-10-CM | POA: Diagnosis not present

## 2021-10-27 DIAGNOSIS — R278 Other lack of coordination: Secondary | ICD-10-CM

## 2021-10-27 DIAGNOSIS — R2689 Other abnormalities of gait and mobility: Secondary | ICD-10-CM

## 2021-10-27 DIAGNOSIS — R262 Difficulty in walking, not elsewhere classified: Secondary | ICD-10-CM

## 2021-10-27 DIAGNOSIS — M6281 Muscle weakness (generalized): Secondary | ICD-10-CM | POA: Diagnosis not present

## 2021-10-27 DIAGNOSIS — R269 Unspecified abnormalities of gait and mobility: Secondary | ICD-10-CM | POA: Diagnosis not present

## 2021-10-27 DIAGNOSIS — R2681 Unsteadiness on feet: Secondary | ICD-10-CM | POA: Diagnosis not present

## 2021-10-27 NOTE — Therapy (Signed)
OUTPATIENT PHYSICAL THERAPY TREATMENT NOTE   Patient Name: Paul Hall MRN: [id number] DOB:11/07/1995, 26 y.o., male Today's Date: 10/27/2021  PCP:  Paul Hall PROVIDER:  Collene Mares, PA-C   PT End of Session - 10/27/21 1610     Visit Number 52    Number of Visits 61    Date for PT Re-Evaluation 12/08/21    Authorization Type [employer] and [employer]- Need auth past 12th visit    Authorization Time Period 01/04/21-03/29/21; Recert [id number]/[id number]; Recert 05/18/863- 10/10/4694    Progress Note Due on Visit 14    PT Start Time 1515    PT Stop Time 1558    PT Time Calculation (min) 43 min    Equipment Utilized During Treatment Gait belt   Hoyer lift   Activity Tolerance Patient tolerated treatment well   queasiness/nausea   Behavior During Therapy WFL for tasks assessed/performed             Past Medical History:  Diagnosis Date   Diabetes mellitus ([place])    Hypertension    Stroke [facility])    Past Surgical History:  Procedure Laterality Date   IVC FILTER PLACEMENT ([place] [facility])     LEG SURGERY     PEG TUBE PLACEMENT     TRACHEOSTOMY     Patient Active Problem List   Diagnosis Date Noted   Sepsis due to vancomycin resistant Enterococcus species ([place]) 06/06/2019   SIRS (systemic inflammatory response syndrome) ([place]) 06/05/2019   Acute lower UTI 06/05/2019   VRE (vancomycin-resistant Enterococci) infection 06/05/2019   Anemia 06/05/2019   Skin ulcer of sacrum with necrosis of muscle ([place])    Urinary retention    Type 2 diabetes mellitus without complication, with long-term current use of insulin (H[facility])    Tachycardia    Lower extremity edema    Acute metabolic encephalopathy    Obstructive sleep apnea    Morbid obesity with BMI of 60.0-69.9, adult ([place])    Goals of care, counseling/discussion    Palliative care encounter    Sepsis ([place]) 04/27/2019   H/O insulin dependent diabetes mellitus 04/27/2019   History of CVA with residual deficit 04/27/2019    Seizure disorder ([place]) 04/27/2019   Decubitus ulcer of sacral region, stage 4 ([place]) 04/27/2019    REFERRING DIAG: Cerebral infarction, unspecified   THERAPY DIAG:  Muscle weakness (generalized)  Other lack of coordination  History of CVA with residual deficit  Difficulty in walking, not elsewhere classified  Unsteadiness on feet  Abnormality of gait and mobility  Other abnormalities of gait and mobility  Rationale for Evaluation and Treatment Rehabilitation  PERTINENT HISTORY: Paul Hall is a 25yoM who presents with severe weakness, quadriparesis, altered sensorium, and visual impairment s/p critical illness and prolonged hospitalization. Pt hospitalized in October 2020 with ARDS 2/2 COVID19 infection. Pt sustained a complex and lengthy hospitalization which included tracheostomy, prolonged sedation, ECMO. In this period pt sustained CVA and SDH. Pt has now been liberated from tracheostomy and G-tube. Pt has since been hospitalized for wound infection and UTI. Pt lives with parents at home, has hospital bed and left chair, hoyer lift transfers, and power WC for mobility needs. Pt needs heavy physical assistance with ADL 2/2 BUE contractures and motor dysfunction   PRECAUTIONS: Fall  SUBJECTIVE: Patient reports no pain and no new issues today.   PAIN:  Are you having pain? No     TODAY'S TREATMENT:  Therapeutic Exercises:   Seated hip march- 2 sets of 15 reps each  LE (2.5#AW on left)  Seated Knee ext- 2 sets of 15 reps each LE (2.5# AW on left and no weight on R LE)  Seated hip abd (manual resistance) - hold 5 sec 2sets x 10 reps each LE Seated Hip add  soft red ball squeeze (hold 5 sec) - 2 sets of 10 reps  Reclined heel slide AAROM BLE x 15 reps Seated Gluteal Sets (hold 5 sec) - 2 sets of 10 reps each LE Reclined manual single leg press (2 sets of 10 reps) each LE.  Reclined static hold - Hip adduction- hold for 30 sec x 2 sets  Reclined SLR (AAROM) BLE 2 sets  of 10 reps   PROM- Hamstring stretch BLE x 30 sec x 3 each LE   Education provided throughout session via VC/TC and demonstration to facilitate movement at target joints and correct muscle activation for all testing and exercises performed.       PATIENT EDUCATION: Education details: posture cues with sitting Person educated: Patient Education method: Explanation, Demonstration, Tactile cues, and Verbal cues Education comprehension: verbalized understanding, returned demonstration, verbal cues required, tactile cues required, and needs further education   HOME EXERCISE PROGRAM:    PT Short Term Goals -       PT SHORT TERM GOAL #1   Title Pt will be independent with HEP in order to improve strength and balance in order to decrease fall risk and improve function at home and work.    Baseline 01/04/2021= No formal HEP in place; 12/12 no HEP in place; 05/10/2021-Patient and his father were able to report compliance with curent HEP consisting of mostly seated/reclined LE strengthening. Both verbalize no questions at this time.    Time 6    Period Weeks    Status Achieved    Target Date 02/15/21              PT Long Term Goals -      PT LONG TERM GOAL #1   Title Patient will increase BLE gross strength by 1/2 muscle grade to improve functional strength for improved independence with potential gait, increased standing tolerance and increased ADL ability.    Baseline 01/04/2021- Patient presents with 1/5 to 3-/5 B LE strength with MMT; 12/12: goal partially met for Left knee/hip; 05/10/2021= 2-/5 bilateal Hip flex; 3+/5 bilateral Knee ext; 06/21/2021= Patient presents with 2-/5 bilateral Hip flex; 3+/5 bilateral knee ext/flex; 2-/5 left ankle DF; 0/5 right ankle- and able to increase reps and resistance with LE's. 09/15/2021- Patient technically presents with 2-/5 B hip flex/abd/add - but he is able to raise his hip up to approx 100 deg which has improved. 3+/5 Bilateral knee ext, 2-/5 left  ankle and 0/5 right ankle.    Time 12    Period Weeks    Status On-going    Target Date 12/08/21      PT LONG TERM GOAL #2   Title Patient will tolerate sitting unsupported demonstrating erect sitting posture for 15 minutes with CGA to demonstrate improved back extensor strength and improved sitting tolerance.    Baseline 01/04/2021- Patient confied to sitting in lift chair or electric power chair with back support and unable to sit upright without physical assistance; 12/12: tolerates <1 minutes upright unsupported sitting. 05/10/2021=static sit with forward trunk lean  in his power wheelchair without back support x approx 3 min. 06/21/2021=Unable to assess today due to patient with acute back pain but on previous visit able to sit x 8 min  without back support. 09/15/2021- on last visit- 09/13/2021- patient was able to sit unsupported x 8 min at edge of mat. 10/13/2023 - Patient was able to sit at edge of mat with varying level of assist today from SBA to min A for a total of 20 min.    Time 12    Period Weeks    Status On-going    Target Date 12/08/21      PT LONG TERM GOAL #3   Title Patient will demonstrate ability to perform static standing in // bars > 2 min with Max Assist  without loss of balance and fair posture for improved overall strength for pre-gait and transfer activities.    Baseline 01/04/2021= Patient current uanble to stand- Dependent on hoyer or sit to stand lift for transfers. 05/10/2021=Not appropriate yet- Currently still dependent with all transfers using hoyer. 06/21/2021= Patient continuing now to focus on LE strengthening to prepare for standing-unable to try today due to acute low back pain-  planning on attempting in new cert period. 09/15/2021- Patient has attempted standing 2x in past two week- max Assist of 2 people - only once was he successful to clearing his bottom from chair - Will continue to be a focus during the new certification.    Time 12    Period Weeks    Status  On-going    Target Date 12/08/21      PT LONG TERM GOAL #4   Title Pt will improve FOTO score by 10 points or more demonstrating improved perceived functional ability    Baseline FOTO 7 on 10/17; 03/15/21: FOTO 12; 05/10/2021 06/21/2021= 1; 09/15/2021= 9    Time 12    Period Weeks    Status On-going    Target Date 12/08/21      PT LONG TERM GOAL #5   Title Patient will perform sit to stand transfer with appropriate AD and max assist of 2 people with 75% consistency to prepare for pregait activities.    Baseline 09/15/2021= Patient unable to stand well- unable to clear his bottom off chair with Max assist of 2 persons.    Time 12    Period Weeks    Status New    Target Date 12/08/21              Plan     Clinical Impression Statement Treatment and plan of care continue to focus on LE strengthening. Patient was able to work with some resistance and less physical assist with exercises today. He was motivated and able to initiate more with right LE - specifically Hip march and leg press.  Patient would benefit from additional skilled PT intervention to improve strength, balance and mobility for improved quality of life.    Personal Factors and Comorbidities Comorbidity 3+;Time since onset of injury/illness/exacerbation    Comorbidities CVA, diabetes, Seizures    Examination-Activity Limitations Bathing;Bed Mobility;Bend;Caring for Others;Carry;Dressing;Hygiene/Grooming;Lift;Locomotion Level;Reach Overhead;Self Feeding;Sit;Squat;Stairs;Stand;Transfers;Toileting    Examination-Participation Restrictions Cleaning;Community Activity;Driving;Laundry;Medication Management;Meal Prep;Occupation;Personal Finances;Shop;Yard Work;Volunteer    Stability/Clinical Decision Making Evolving/Moderate complexity    Rehab Potential Fair    PT Frequency 2x / week    PT Duration 12 weeks    PT Treatment/Interventions ADLs/Self Care Home Management;Cryotherapy;Electrical Stimulation;Moist Heat;Ultrasound;DME  Instruction;Gait training;Stair training;Functional mobility training;Therapeutic exercise;Balance training;Patient/family education;Orthotic Fit/Training;Neuromuscular re-education;Wheelchair mobility training;Manual techniques;Passive range of motion;Dry needling;Energy conservation;Taping;Visual/perceptual remediation/compensation;Joint Manipulations    PT Next Visit Plan core strength- sitting at EOM, postural control, sabina sit to stand if able; Continue with progressive LE Strengthening  PT Home Exercise Plan No changes to HEP today    Consulted and Agree with Plan of Care Patient;Family member/caregiver    Family Member Consulted Dad               Lewis Moccasin, PT 10/27/2021, 4:31 PM

## 2021-10-28 DIAGNOSIS — I6389 Other cerebral infarction: Secondary | ICD-10-CM | POA: Diagnosis not present

## 2021-10-28 DIAGNOSIS — L8994 Pressure ulcer of unspecified site, stage 4: Secondary | ICD-10-CM | POA: Diagnosis not present

## 2021-10-28 DIAGNOSIS — G4733 Obstructive sleep apnea (adult) (pediatric): Secondary | ICD-10-CM | POA: Diagnosis not present

## 2021-10-28 NOTE — Therapy (Signed)
OUTPATIENT OCCUPATIONAL THERAPY TREATMENT NOTE   Patient Name: Paul Hall MRN: [id number] DOB:December 14, 1995, 26 y.o., male Today's Date: 10/28/2021  REFERRING PROVIDER:  Lenise Herald, MD .otendofsession   OT End of Session -        Visit number                                                                61      Number of visits                                                          96      Date of Revaluation                                                    12/07/2021  OT Start Time 1510   OT Stop Time 1605   OT Time Calculation (min) 45 min    Activity Tolerance Patient tolerated treatment well    Behavior During Therapy [facility] for tasks assessed/performed         .    Past Medical History:  Diagnosis Date   Diabetes mellitus (HCC)    Hypertension    Stroke [facility])    Past Surgical History:  Procedure Laterality Date   IVC FILTER PLACEMENT ([facility] HX)     LEG SURGERY     PEG TUBE PLACEMENT     TRACHEOSTOMY     Patient Active Problem List   Diagnosis Date Noted   Sepsis due to vancomycin resistant Enterococcus species (HCC) 06/06/2019   SIRS (systemic inflammatory response syndrome) (HCC) 06/05/2019   Acute lower UTI 06/05/2019   VRE (vancomycin-resistant Enterococci) infection 06/05/2019   Anemia 06/05/2019   Skin ulcer of sacrum with necrosis of muscle (HCC)    Urinary retention    Type 2 diabetes mellitus without complication, with long-term current use of insulin (HCC)    Tachycardia    Lower extremity edema    Acute metabolic encephalopathy    Obstructive sleep apnea    Morbid obesity with BMI of 60.0-69.9, adult ([facility]CC)    Goals of care, counseling/discussion    Palliative care encounter    Sepsis (HCC) 04/27/2019   H/O insulin dependent diabetes mellitus 04/27/2019   History of CVA with residual deficit 04/27/2019   Seizure disorder (HCC) 04/27/2019   Decubitus ulcer of sacral region, stage 4 (HCC) 04/27/2019      REFERRING DIAG: CVA,  COVID-19  THERAPY DIAG:  Muscle weakness (generalized)  Other lack of coordination  History of CVA with residual deficit  Rationale for Evaluation and Treatment Rehabilitation  PERTINENT HISTORY: Pt. is a 26 y.o. male who was hospitalized, and diagnosed with COVID-19, sustained a CVA with resultant quadreplegia in 01/2019. Pt. was hospitalized with VRE  UTI. Pt. PMHx includes: urinary retention, Seizure Disorder, Obstructive Sleep Disorder, DM Type II, and Morbid obesity. Pt. resides at home with his parents  PRECAUTIONS:  No  SUBJECTIVE: In discussing pt's UB goals, father reports he would like for pt to work towards being able to pull himself forward and push up from his wc in prep for a transfer.    PAIN:  Are you having pain? No pain at rest, 5-6/10 pain in bilat hands with passive stretching at end range.  OBJECTIVE:   TODAY'S TREATMENT:  Moist heat applied to R/L hands x 5 min for muscle relaxation in prep for therapeutic exercises.    Therapeutic Exercise: Pt. tolerated bilateral wrist flexion, extension, and MP, PIP, and DIP flexion, and extension with reports of 5-6/10 pain at end range ext this afternoon.  Practiced grasping dowel in front of him and pulling self forward engaging core and BUEs in prep for sit to stand transfers.  Facilitated R/L tricep strengthening with manual resistance from OT to work towards pushing self up from seat in prep for sit to stand transfers, x3 sets 10 reps each, pt tolerating moderate resistance from OT on each arm.  Advised pt/father that current wrist extension to enable pt to push up from his seat is not sufficient, and likely not to significantly improve unless pt starts a splinting schedule.  Father reports that the medical supply store has kept them in limbo as far a ordering the recommended splint, and father may choose to order himself and hope to be reimbursed by insurance later.  OT explained use of neutral wrist with closed fist as  another option for pushing up from seat, but goal is certainly to increase wrist extension long term for other functional tasks.  OT issued soft blue putty and instructed in gross grasping and thumb flexion as pt had difficulty, more so on the L hand, gripping dowel in front of him to pull self forward with above noted activity.  Positioned pt at high table top to work with putty, and instructed pt in grasp/release/reaching to move putty back/forth in front of him on table top, and transferring putty between L/R hands.  Pt required cues for technique and occasional min A, but pt felt encouraged with new activities that he could practice at home to increase hand strength.     PATIENT EDUCATION: Education details: importance of splinting to increase wrist ext, theraputty exercises Person educated: Patient and Caregiver Education method: Explanation, Demonstration, and Verbal cues Education comprehension: verbalized understanding, returned demonstration, verbal cues required, and needs further education   HOME EXERCISE PROGRAM   BUE, hand, and digit ROM, theraputty exercises    OT Short Term Goals - 09/13/21 1641       OT SHORT TERM GOAL #1   Title Pt and caregiver will obtain splint and follow wearing schedule consistently 7 days/week    Baseline 09/14/21: working to obtain splint    Time 6    Period Weeks    Status New    Target Date 10/26/21              OT Long Term Goals - 09/13/21 1623       OT LONG TERM GOAL #1   Title Pt. will improve FOTO score by 5 points for clinically significant improvements during ADLs, and IADLs,    Baseline 30th visit: FOTO: 37, 21st: FOTO 43. 10th Visit: FOTO score 42 Eval: FOTO score: 41 FOTO score: 39; 50th: FOTO 52; 09/13/21: FOTO 41    Time 12    Period Weeks    Status On-going    Target Date 12/07/21  OT LONG TERM GOAL #2   Title Pt. will improve right shoulder flexion by 15 degrees to assist caregivers with UE dressing    Baseline Pt.  continues to require assist with UE dressing secondary to limited ROM; 08/16/21: Max A-total A UB dressing; R shoulder flexion/abd combo 0-100; 09/13/21: R shoulder flexion/abd combo 0-110, R shoulder hiking noted    Time 12    Period Weeks    Status Revised    Target Date 12/07/21      OT LONG TERM GOAL #3   Title Pt. will improve right Abduction by 10 degrees to assist caregivers with underarm hygiene care.    Baseline Eval: AROM abduction RUE 76. Pt. continues to present with limited bilateral abduction. 21st: AROM abduction RUE 80.06/16/2021: Right shulder abduction 108. 06/23/2021: Right shoulder abduction: 110; 08/16/21: abd 110    Time 12    Period Weeks    Status Achieved    Target Date 09/08/21      OT LONG TERM GOAL #4   Title Pt. will demonstrate visual compensatory strategies for navigating through his environment.    Baseline 30th visit: Pt. continues to require assist to navigate through his environment. 20th: Pt continues to require assist to navigate environment, minimal knowledge of strategies. Eval: Limited 06/23/2021: Pt. continues to be limited, and requires assist  with navigating through his environment; 08/16/21: father reports pt only steers his own wc in 1 open room of their house, otherwise dep to navigate environment. 09/13/21: limited steering outside of house, steers in 1 room of house. 09/13/21: pt seeing low vision OT specialist to address.    Time 12    Period Weeks    Status Deferred    Target Date 09/08/21      OT LONG TERM GOAL #5   Title Pt. will demonstrate visual compensatory strategies for tabletop tasks within his near space.    Baseline 06/16/2021: Pt. continues to present with visual limitations for tabletop tasks in his near space. Pt. continues to present with limited vision. 21st: Pt can identify objects on tabletop ~waterbottle sized, improves with contrasting colours, cannot ID small onjects; 08/16/21: no change from last assessment period. 09/13/21: pt  seeing low vision OT specialist to address.    Time 12    Period Weeks    Status Deferred    Target Date 09/08/21      OT LONG TERM GOAL #6   Title Pt. will perform self feeding with SETUP using adaptive equipment    Baseline 06/23/2021: Pt. is able to use his bilateral hands to perfrom hand to mouth patterns for self-feeding with finger foods. Pt. is now able to use his left hand to bring a water bottle to his mouth,a nd drink from it. 30th visit: Pt. continues to present with weakness limiting functional hand to mouth patterns of movement. Pt. is able to perform hand to mouth patterns, however reports weakness in his hand  limits a secure grasp on the utensil. Pt. requires assist to perform; Eval: Pt. has difficulty; 08/16/21: pt continues to use L hand for self feeding finger foods with set up.  Spasticity limits dexterity to manipulate eating utensils. 09/13/21: can grip built up handle however requires MIN A 2/2 vision and wrist ROM deficits.    Time 12    Period Weeks    Status Revised    Target Date 12/07/21      OT LONG TERM GOAL #7   Title Pt. will perform one step self-grooming  tasks with SETUP    Baseline 06/23/2021: Pt. is able to perform the hand to face pattern with the left hand, however has difficulty actively sustaining his BUEs in elevation long enough to perform self-grooming tasks. 06/16/2021: Pt. is able to perform the hand to face pattern with the left hand, however has difficulty actively sustaining his BUEs in elevation long enough to perform self-grooming tasks; 08/16/21: same as 06/16/21. 09/13/21: limited by trunk and sustaining BUEs    Time 12    Period Weeks    Status Revised    Target Date 12/07/21      OT LONG TERM GOAL #8   Title Pt will complete UB dressing tasks with SETUP and assist to thread over hands only    Baseline 06/23/2021: Pt. requires MinA to thread arms, and requires maxA to donn over upper arms, MaxAx2 to donn over his back. 06/16/2021: Pt. has progressed   to require MinA to thread arms, and requires maxA to donn over upper arms, MaxAx2 to donn over his back; 08/16/21: min A to thread arms, max A to don over upper arms and over head. 09/13/21: MOD A    Time 12    Period Weeks    Status On-going    Target Date 12/07/21      OT LONG TERM GOAL  #9   TITLE Pt will improve B grip strength by 10# to assist in handling ADL items    Baseline 08/16/21: bilat girp 0#; 09/13/21: L grip 6#, R grip 9#    Time 12    Period Weeks    Status Revised    Target Date 12/06/21      OT LONG TERM GOAL  #10   TITLE Pt will improve B lateral pinch strength by 8# to assist in UBD.    Baseline N/A: awaiting pinch meter to be fixed. Pt. unable to formulate proper positioning for lateral pinch assessment of the replacement pinch meter; 08/16/21: R 2#, L 1.5#; ; 09/13/21: R 6#, L 2#    Time 12    Period Weeks    Status Revised    Target Date 12/07/21              Plan - 09/23/21 1416     Clinical Impression Statement In discussing pt's UB goals, father reports he would like for pt to work towards being able to pull himself forward and push up from his wc in prep for a transfer.  Practiced grasping dowel in front of him and pulling self forward engaging core and BUEs in prep for sit to stand transfers.  Pt tolerated tricep strengthening with manual resistance from OT to work towards pushing self up from seat in prep for sit to stand transfers, pt tolerating moderate resistance from OT on each arm.  Advised pt/father that current wrist extension to enable pt to push up from his seat is not sufficient, and likely not to significantly improve unless pt starts a splinting schedule.  Father reports that the medical supply store has kept them in limbo as far a ordering the recommended splint, and father may choose to order himself and hope to be reimbursed by insurance later.  OT explained also using neutral wrist with closed fist as another option for pushing up from seat, but  goal is certainly to increase wrist extension long term for other functional tasks.  OT issued soft blue putty and instructed in gross grasping and thumb flexion as pt had difficulty, more so on  the L hand, gripping dowel in front of him to pull self forward with above noted activity.  Pt tolerated putty exercises well with cues for technique and occasional min A, but pt felt encouraged with new activities that he could practice at home to increase hand strength.  Pt  continues to benefit from OT services to improve ROM, and facilitate active volitional movement in order to work towards engaging his BUEs in daily ADLs, and IADL tasks.     OT Occupational Profile and History Detailed Assessment- Review of Records and additional review of physical, cognitive, psychosocial history related to current functional performance    Occupational performance deficits (Please refer to evaluation for details): ADL's;IADL's    Body Structure / Function / Physical Skills ADL;FMC;ROM;Dexterity;IADL;Endurance;Obesity;Strength;UE functional use    Rehab Potential Good    Clinical Decision Making Multiple treatment options, significant modification of task necessary    Comorbidities Affecting Occupational Performance: Presence of comorbidities impacting occupational performance    Modification or Assistance to Complete Evaluation  Max significant modification of tasks or assist is necessary to complete    OT Frequency 2x / week    OT Duration 12 weeks    OT Treatment/Interventions Self-care/ADL training;Therapeutic exercise;Neuromuscular education;Visual/perceptual remediation/compensation;Patient/family education;Therapeutic activities;DME and/or AE instruction;Passive range of motion;Moist Heat;Electrical Stimulation    Consulted and Agree with Plan of Care Patient            Danelle Earthly, MS, OTR/L   Otis Dials, OT 10/28/2021, 8:21 AM

## 2021-10-29 ENCOUNTER — Encounter: Payer: Self-pay | Admitting: Dermatology

## 2021-11-01 ENCOUNTER — Ambulatory Visit: Payer: BC Managed Care – PPO

## 2021-11-03 ENCOUNTER — Telehealth: Payer: Self-pay | Admitting: Student

## 2021-11-03 ENCOUNTER — Ambulatory Visit: Payer: BC Managed Care – PPO

## 2021-11-03 ENCOUNTER — Ambulatory Visit: Payer: BC Managed Care – PPO | Attending: Physician Assistant

## 2021-11-03 DIAGNOSIS — R278 Other lack of coordination: Secondary | ICD-10-CM

## 2021-11-03 DIAGNOSIS — I6930 Unspecified sequelae of cerebral infarction: Secondary | ICD-10-CM | POA: Diagnosis not present

## 2021-11-03 DIAGNOSIS — M6281 Muscle weakness (generalized): Secondary | ICD-10-CM

## 2021-11-03 DIAGNOSIS — R269 Unspecified abnormalities of gait and mobility: Secondary | ICD-10-CM | POA: Insufficient documentation

## 2021-11-03 DIAGNOSIS — R2689 Other abnormalities of gait and mobility: Secondary | ICD-10-CM | POA: Diagnosis not present

## 2021-11-03 DIAGNOSIS — R2681 Unsteadiness on feet: Secondary | ICD-10-CM | POA: Diagnosis not present

## 2021-11-03 DIAGNOSIS — R262 Difficulty in walking, not elsewhere classified: Secondary | ICD-10-CM | POA: Diagnosis not present

## 2021-11-03 NOTE — Telephone Encounter (Signed)
Attempted to contact patient to schedule Palliative Consult, no answer and unable to leave message due to mailbox was full.  Also attempted to contact wife on her cell with no answer - left message with reason for call and requested a return call to schedule visit.

## 2021-11-03 NOTE — Therapy (Signed)
OUTPATIENT PHYSICAL THERAPY TREATMENT NOTE   Patient Name: Paul Hall MRN: [id number] DOB:Apr 14, 1995, 26 y.o., male Today's Date: 11/04/2021  PCP:  Loistine Chance PROVIDER:  Collene Mares, PA-C   PT End of Session - 11/03/21 1531     Visit Number 53    Number of Visits 61    Date for PT Re-Evaluation 12/08/21    Authorization Type [employer] and [employer]- Need auth past 12th visit    Authorization Time Period 01/04/21-03/29/21; Recert 29/56/[id number]/65/[id number]; Recert 9/62/9528- 07/04/3242    Progress Note Due on Visit 89    PT Start Time 1448    PT Stop Time 1514    PT Time Calculation (min) 26 min    Equipment Utilized During Treatment Gait belt   Hoyer lift   Activity Tolerance Patient tolerated treatment well   queasiness/nausea   Behavior During Therapy WFL for tasks assessed/performed              Past Medical History:  Diagnosis Date   Diabetes mellitus ([place])    Hypertension    Stroke [facility])    Past Surgical History:  Procedure Laterality Date   IVC FILTER PLACEMENT ([place] HX)     LEG SURGERY     PEG TUBE PLACEMENT     TRACHEOSTOMY     Patient Active Problem List   Diagnosis Date Noted   Sepsis due to vancomycin resistant Enterococcus species ([place]) 06/06/2019   SIRS (systemic inflammatory response syndrome) ([place]) 06/05/2019   Acute lower UTI 06/05/2019   VRE (vancomycin-resistant Enterococci) infection 06/05/2019   Anemia 06/05/2019   Skin ulcer of sacrum with necrosis of muscle ([place])    Urinary retention    Type 2 diabetes mellitus without complication, with long-term current use of insulin (HCC)    Tachycardia    Lower extremity edema    Acute metabolic encephalopathy    Obstructive sleep apnea    Morbid obesity with BMI of 60.0-69.9, adult ([place])    Goals of care, counseling/discussion    Palliative care encounter    Sepsis ([place]) 04/27/2019   H/O insulin dependent diabetes mellitus 04/27/2019   History of CVA with residual deficit 04/27/2019    Seizure disorder ([place]) 04/27/2019   Decubitus ulcer of sacral region, stage 4 ([place]) 04/27/2019    REFERRING DIAG: Cerebral infarction, unspecified   THERAPY DIAG:  Muscle weakness (generalized)  Other lack of coordination  History of CVA with residual deficit  Difficulty in walking, not elsewhere classified  Unsteadiness on feet  Abnormality of gait and mobility  Other abnormalities of gait and mobility  Rationale for Evaluation and Treatment Rehabilitation  PERTINENT HISTORY: Paul Hall is a 25yoM who presents with severe weakness, quadriparesis, altered sensorium, and visual impairment s/p critical illness and prolonged hospitalization. Pt hospitalized in October 2020 with ARDS 2/2 COVID19 infection. Pt sustained a complex and lengthy hospitalization which included tracheostomy, prolonged sedation, ECMO. In this period pt sustained CVA and SDH. Pt has now been liberated from tracheostomy and G-tube. Pt has since been hospitalized for wound infection and UTI. Pt lives with parents at home, has hospital bed and left chair, hoyer lift transfers, and power WC for mobility needs. Pt needs heavy physical assistance with ADL 2/2 BUE contractures and motor dysfunction   PRECAUTIONS: Fall  SUBJECTIVE: Patient reports he is late due to having transportation issues. He also reports not feeling well today stating did not sleep well and just feels like he might be getting a little under the weather.  PAIN:  Are you having pain? No     TODAY'S TREATMENT:  Therapeutic Exercises:  PROM- Hamstring stretch BLE x 30 sec x 3 each LE PROM- Hip ER/IR BLE x 30 sec x 3 each LE Hip circles x 20 reps CW then 20 more CCW Each LE Hip abduction stretch - PT pushing knees together x 30 sec x 3 each LE  Seated hip march- 2 sets of 15 reps each LE (AAROM)  Seated Knee ext- 2 sets of 15 reps each LE (AAROM)   Seated hip abd (manual resistance) - hold 5 sec 2sets x 10 reps each LE Seated Hip add   soft red ball squeeze (hold 5 sec) - 2 sets of 10 reps    Education provided throughout session via VC/TC and demonstration to facilitate movement at target joints and correct muscle activation for all testing and exercises performed.       PATIENT EDUCATION: Education details: posture cues with sitting Person educated: Patient Education method: Explanation, Demonstration, Tactile cues, and Verbal cues Education comprehension: verbalized understanding, returned demonstration, verbal cues required, tactile cues required, and needs further education   HOME EXERCISE PROGRAM:    PT Short Term Goals -       PT SHORT TERM GOAL #1   Title Pt will be independent with HEP in order to improve strength and balance in order to decrease fall risk and improve function at home and work.    Baseline 01/04/2021= No formal HEP in place; 12/12 no HEP in place; 05/10/2021-Patient and his father were able to report compliance with curent HEP consisting of mostly seated/reclined LE strengthening. Both verbalize no questions at this time.    Time 6    Period Weeks    Status Achieved    Target Date 02/15/21              PT Long Term Goals -      PT LONG TERM GOAL #1   Title Patient will increase BLE gross strength by 1/2 muscle grade to improve functional strength for improved independence with potential gait, increased standing tolerance and increased ADL ability.    Baseline 01/04/2021- Patient presents with 1/5 to 3-/5 B LE strength with MMT; 12/12: goal partially met for Left knee/hip; 05/10/2021= 2-/5 bilateal Hip flex; 3+/5 bilateral Knee ext; 06/21/2021= Patient presents with 2-/5 bilateral Hip flex; 3+/5 bilateral knee ext/flex; 2-/5 left ankle DF; 0/5 right ankle- and able to increase reps and resistance with LE's. 09/15/2021- Patient technically presents with 2-/5 B hip flex/abd/add - but he is able to raise his hip up to approx 100 deg which has improved. 3+/5 Bilateral knee ext, 2-/5 left ankle  and 0/5 right ankle.    Time 12    Period Weeks    Status On-going    Target Date 12/08/21      PT LONG TERM GOAL #2   Title Patient will tolerate sitting unsupported demonstrating erect sitting posture for 15 minutes with CGA to demonstrate improved back extensor strength and improved sitting tolerance.    Baseline 01/04/2021- Patient confied to sitting in lift chair or electric power chair with back support and unable to sit upright without physical assistance; 12/12: tolerates <1 minutes upright unsupported sitting. 05/10/2021=static sit with forward trunk lean  in his power wheelchair without back support x approx 3 min. 06/21/2021=Unable to assess today due to patient with acute back pain but on previous visit able to sit x 8 min without back support. 09/15/2021- on  last visit- 09/13/2021- patient was able to sit unsupported x 8 min at edge of mat. 10/13/2023 - Patient was able to sit at edge of mat with varying level of assist today from SBA to min A for a total of 20 min.    Time 12    Period Weeks    Status On-going    Target Date 12/08/21      PT LONG TERM GOAL #3   Title Patient will demonstrate ability to perform static standing in // bars > 2 min with Max Assist  without loss of balance and fair posture for improved overall strength for pre-gait and transfer activities.    Baseline 01/04/2021= Patient current uanble to stand- Dependent on hoyer or sit to stand lift for transfers. 05/10/2021=Not appropriate yet- Currently still dependent with all transfers using hoyer. 06/21/2021= Patient continuing now to focus on LE strengthening to prepare for standing-unable to try today due to acute low back pain-  planning on attempting in new cert period. 09/15/2021- Patient has attempted standing 2x in past two week- max Assist of 2 people - only once was he successful to clearing his bottom from chair - Will continue to be a focus during the new certification.    Time 12    Period Weeks    Status On-going     Target Date 12/08/21      PT LONG TERM GOAL #4   Title Pt will improve FOTO score by 10 points or more demonstrating improved perceived functional ability    Baseline FOTO 7 on 10/17; 03/15/21: FOTO 12; 05/10/2021 06/21/2021= 1; 09/15/2021= 9    Time 12    Period Weeks    Status On-going    Target Date 12/08/21      PT LONG TERM GOAL #5   Title Patient will perform sit to stand transfer with appropriate AD and max assist of 2 people with 75% consistency to prepare for pregait activities.    Baseline 09/15/2021= Patient unable to stand well- unable to clear his bottom off chair with Max assist of 2 persons.    Time 12    Period Weeks    Status New    Target Date 12/08/21              Plan     Clinical Impression Statement Treatment and plan of care continue to focus on LE strengthening however patient was not feeling well today so concentrated on some general LE stretching due to tightness and some light LE strengthening. Patient was easily fatigued and unable to actively move either LE very well today. Patient would benefit from additional skilled PT intervention to improve strength, balance and mobility for improved quality of life.    Personal Factors and Comorbidities Comorbidity 3+;Time since onset of injury/illness/exacerbation    Comorbidities CVA, diabetes, Seizures    Examination-Activity Limitations Bathing;Bed Mobility;Bend;Caring for Others;Carry;Dressing;Hygiene/Grooming;Lift;Locomotion Level;Reach Overhead;Self Feeding;Sit;Squat;Stairs;Stand;Transfers;Toileting    Examination-Participation Restrictions Cleaning;Community Activity;Driving;Laundry;Medication Management;Meal Prep;Occupation;Personal Finances;Shop;Yard Work;Volunteer    Stability/Clinical Decision Making Evolving/Moderate complexity    Rehab Potential Fair    PT Frequency 2x / week    PT Duration 12 weeks    PT Treatment/Interventions ADLs/Self Care Home Management;Cryotherapy;Electrical Stimulation;Moist  Heat;Ultrasound;DME Instruction;Gait training;Stair training;Functional mobility training;Therapeutic exercise;Balance training;Patient/family education;Orthotic Fit/Training;Neuromuscular re-education;Wheelchair mobility training;Manual techniques;Passive range of motion;Dry needling;Energy conservation;Taping;Visual/perceptual remediation/compensation;Joint Manipulations    PT Next Visit Plan core strength- sitting at EOM, postural control, sabina sit to stand if able; Continue with progressive LE Strengthening    PT Home  Exercise Plan No changes to HEP today    Consulted and Agree with Plan of Care Patient;Family member/caregiver    Family Member Consulted Dad               Lewis Moccasin, PT 11/04/2021, 5:16 PM

## 2021-11-04 ENCOUNTER — Telehealth: Payer: Self-pay | Admitting: Student

## 2021-11-04 NOTE — Therapy (Signed)
OUTPATIENT OCCUPATIONAL THERAPY TREATMENT NOTE   Patient Name: Paul Hall MRN: [id number] DOB:07-18-1995, 26 y.o., male Today's Date: 11/04/2021  REFERRING PROVIDER:  Lenise Herald, MD .otendofsession   OT End of Session -        Visit number                                                                61      Number of visits                                                          96      Date of Revaluation                                                    12/07/2021  OT Start Time 1510   OT Stop Time 1605   OT Time Calculation (min) 45 min    Activity Tolerance Patient tolerated treatment well    Behavior During Therapy [facility] for tasks assessed/performed         .    Past Medical History:  Diagnosis Date   Diabetes mellitus (HCC)    Hypertension    Stroke [facility])    Past Surgical History:  Procedure Laterality Date   IVC FILTER PLACEMENT ([facility] HX)     LEG SURGERY     PEG TUBE PLACEMENT     TRACHEOSTOMY     Patient Active Problem List   Diagnosis Date Noted   Sepsis due to vancomycin resistant Enterococcus species (HCC) 06/06/2019   SIRS (systemic inflammatory response syndrome) (HCC) 06/05/2019   Acute lower UTI 06/05/2019   VRE (vancomycin-resistant Enterococci) infection 06/05/2019   Anemia 06/05/2019   Skin ulcer of sacrum with necrosis of muscle (HCC)    Urinary retention    Type 2 diabetes mellitus without complication, with long-term current use of insulin (HCC)    Tachycardia    Lower extremity edema    Acute metabolic encephalopathy    Obstructive sleep apnea    Morbid obesity with BMI of 60.0-69.9, adult (HCC)    Goals of care, counseling/discussion    Palliative care encounter    Sepsis (HCC) 04/27/2019   H/O insulin dependent diabetes mellitus 04/27/2019   History of CVA with residual deficit 04/27/2019   Seizure disorder (HCC) 04/27/2019   Decubitus ulcer of sacral region, stage 4 (HCC) 04/27/2019      REFERRING DIAG: CVA,  COVID-19  THERAPY DIAG:  Muscle weakness (generalized)  Other lack of coordination  History of CVA with residual deficit  Rationale for Evaluation and Treatment Rehabilitation  PERTINENT HISTORY: Pt. is a 26 y.o. male who was hospitalized, and diagnosed with COVID-19, sustained a CVA with resultant quadreplegia in 01/2019. Pt. was hospitalized with VRE  UTI. Pt. PMHx includes: urinary retention, Seizure Disorder, Obstructive Sleep Disorder, DM Type II, and Morbid obesity. Pt. resides at home with his parents  PRECAUTIONS:  No  SUBJECTIVE: Pt reports that he feels like he's coming down with a Summer cold.  Pt wore a mask today.  PAIN:  Are you having pain? No pain at rest, 5-6/10 pain in bilat hands with passive stretching at end range.  OBJECTIVE:   TODAY'S TREATMENT:  Moist heat applied to R/L hands x 5 min for muscle relaxation in prep for therapeutic exercises, simultaneous to passive stretching; alternated hot pack between R/L hands while the alternate hand was stretched.  Therapeutic Exercise: Pt. tolerated bilateral wrist flexion, extension, and MP, PIP, and DIP flexion, and extension.  Completed grip strengthening with use of 1.5# digi-flex for 3 sets 10 reps in each hand.  Pt required assist with set up of digi-flex in each hand, and passive digit and wrist extension stretching and WB to hand on table top between sets to help with tone reduction.    Neuro re-ed: Facilitated grasp/release and lateral pinching patterns working with small pvc pipes at table top level.  Pt worked to pick up 2 pieces of pipe secured together, then pulled apart, and moved pieces 1 at a time from L hand to R hand to basket on table top to engage bilat hands.  Pt was able to successfully pull apart pipes x3 trials (loosely held together) with OT providing max vc for technique and hand placement on pipes.  When working to pick up pipes from table top, pt was most successful picking up 5.5" length straight  pipes with the L hand, and was unable to manage the shorter pipes without dropping.  Pt struggled to transfer 5.5" length pipes from L to R hand, but was able to manage 2 independently without dropping.   PATIENT EDUCATION: Education details: grasp/release activities at table top level Person educated: pt Education method: Solicitor, and Verbal cues Education comprehension: verbalized understanding, returned demonstration, verbal cues required, and needs further education   HOME EXERCISE PROGRAM   BUE, hand, and digit ROM, theraputty exercises    OT Short Term Goals - 09/13/21 1641       OT SHORT TERM GOAL #1   Title Pt and caregiver will obtain splint and follow wearing schedule consistently 7 days/week    Baseline 09/14/21: working to obtain splint    Time 6    Period Weeks    Status New    Target Date 10/26/21              OT Long Term Goals - 09/13/21 1623       OT LONG TERM GOAL #1   Title Pt. will improve FOTO score by 5 points for clinically significant improvements during ADLs, and IADLs,    Baseline 30th visit: FOTO: 37, 21st: FOTO 43. 10th Visit: FOTO score 42 Eval: FOTO score: 41 FOTO score: 39; 50th: FOTO 52; 09/13/21: FOTO 41    Time 12    Period Weeks    Status On-going    Target Date 12/07/21      OT LONG TERM GOAL #2   Title Pt. will improve right shoulder flexion by 15 degrees to assist caregivers with UE dressing    Baseline Pt. continues to require assist with UE dressing secondary to limited ROM; 08/16/21: Max A-total A UB dressing; R shoulder flexion/abd combo 0-100; 09/13/21: R shoulder flexion/abd combo 0-110, R shoulder hiking noted    Time 12    Period Weeks    Status Revised    Target Date 12/07/21      OT LONG  TERM GOAL #3   Title Pt. will improve right Abduction by 10 degrees to assist caregivers with underarm hygiene care.    Baseline Eval: AROM abduction RUE 76. Pt. continues to present with limited bilateral abduction.  21st: AROM abduction RUE 80.06/16/2021: Right shulder abduction 108. 06/23/2021: Right shoulder abduction: 110; 08/16/21: abd 110    Time 12    Period Weeks    Status Achieved    Target Date 09/08/21      OT LONG TERM GOAL #4   Title Pt. will demonstrate visual compensatory strategies for navigating through his environment.    Baseline 30th visit: Pt. continues to require assist to navigate through his environment. 20th: Pt continues to require assist to navigate environment, minimal knowledge of strategies. Eval: Limited 06/23/2021: Pt. continues to be limited, and requires assist  with navigating through his environment; 08/16/21: father reports pt only steers his own wc in 1 open room of their house, otherwise dep to navigate environment. 09/13/21: limited steering outside of house, steers in 1 room of house. 09/13/21: pt seeing low vision OT specialist to address.    Time 12    Period Weeks    Status Deferred    Target Date 09/08/21      OT LONG TERM GOAL #5   Title Pt. will demonstrate visual compensatory strategies for tabletop tasks within his near space.    Baseline 06/16/2021: Pt. continues to present with visual limitations for tabletop tasks in his near space. Pt. continues to present with limited vision. 21st: Pt can identify objects on tabletop ~waterbottle sized, improves with contrasting colours, cannot ID small onjects; 08/16/21: no change from last assessment period. 09/13/21: pt seeing low vision OT specialist to address.    Time 12    Period Weeks    Status Deferred    Target Date 09/08/21      OT LONG TERM GOAL #6   Title Pt. will perform self feeding with SETUP using adaptive equipment    Baseline 06/23/2021: Pt. is able to use his bilateral hands to perfrom hand to mouth patterns for self-feeding with finger foods. Pt. is now able to use his left hand to bring a water bottle to his mouth,a nd drink from it. 30th visit: Pt. continues to present with weakness limiting functional hand  to mouth patterns of movement. Pt. is able to perform hand to mouth patterns, however reports weakness in his hand  limits a secure grasp on the utensil. Pt. requires assist to perform; Eval: Pt. has difficulty; 08/16/21: pt continues to use L hand for self feeding finger foods with set up.  Spasticity limits dexterity to manipulate eating utensils. 09/13/21: can grip built up handle however requires MIN A 2/2 vision and wrist ROM deficits.    Time 12    Period Weeks    Status Revised    Target Date 12/07/21      OT LONG TERM GOAL #7   Title Pt. will perform one step self-grooming tasks with SETUP    Baseline 06/23/2021: Pt. is able to perform the hand to face pattern with the left hand, however has difficulty actively sustaining his BUEs in elevation long enough to perform self-grooming tasks. 06/16/2021: Pt. is able to perform the hand to face pattern with the left hand, however has difficulty actively sustaining his BUEs in elevation long enough to perform self-grooming tasks; 08/16/21: same as 06/16/21. 09/13/21: limited by trunk and sustaining BUEs    Time 12    Period Weeks  Status Revised    Target Date 12/07/21      OT LONG TERM GOAL #8   Title Pt will complete UB dressing tasks with SETUP and assist to thread over hands only    Baseline 06/23/2021: Pt. requires MinA to thread arms, and requires maxA to donn over upper arms, MaxAx2 to donn over his back. 06/16/2021: Pt. has progressed  to require MinA to thread arms, and requires maxA to donn over upper arms, MaxAx2 to donn over his back; 08/16/21: min A to thread arms, max A to don over upper arms and over head. 09/13/21: MOD A    Time 12    Period Weeks    Status On-going    Target Date 12/07/21      OT LONG TERM GOAL  #9   TITLE Pt will improve B grip strength by 10# to assist in handling ADL items    Baseline 08/16/21: bilat girp 0#; 09/13/21: L grip 6#, R grip 9#    Time 12    Period Weeks    Status Revised    Target Date 12/06/21       OT LONG TERM GOAL  #10   TITLE Pt will improve B lateral pinch strength by 8# to assist in UBD.    Baseline N/A: awaiting pinch meter to be fixed. Pt. unable to formulate proper positioning for lateral pinch assessment of the replacement pinch meter; 08/16/21: R 2#, L 1.5#; ; 09/13/21: R 6#, L 2#    Time 12    Period Weeks    Status Revised    Target Date 12/07/21              Plan - 09/23/21 1416     Clinical Impression Statement Pt reported he feels like he's coming down with a Summer cold with some coughing episodes today.  Pt did wear a mask and tolerated all activities well this date.  Pt reported that he did work with his theraputty at home, working to squeeze and reach for putty while transferring putty between L and R hand.  Pt practiced grasp/release and engaging bilat hands working with small pvc pipes this date at table top level.  Pt was able to successfully pull apart pipes x3 trials (loosely held together) with OT providing max vc for technique and hand placement on pipes.  When working to pick up pipes from table top, pt was most successful picking up 5.5" length straight pipes with the L hand, and was unable to manage the shorter pipes without dropping.  Pt struggled to transfer 5.5" length pipes from L to R hand, but was able to manage 2 independently without dropping.  Pt  continues to benefit from OT services to improve ROM, and facilitate active volitional movement in order to work towards engaging his BUEs in daily ADLs, and IADL tasks.     OT Occupational Profile and History Detailed Assessment- Review of Records and additional review of physical, cognitive, psychosocial history related to current functional performance    Occupational performance deficits (Please refer to evaluation for details): ADL's;IADL's    Body Structure / Function / Physical Skills ADL;FMC;ROM;Dexterity;IADL;Endurance;Obesity;Strength;UE functional use    Rehab Potential Good    Clinical Decision  Making Multiple treatment options, significant modification of task necessary    Comorbidities Affecting Occupational Performance: Presence of comorbidities impacting occupational performance    Modification or Assistance to Complete Evaluation  Max significant modification of tasks or assist is necessary to complete  OT Frequency 2x / week    OT Duration 12 weeks    OT Treatment/Interventions Self-care/ADL training;Therapeutic exercise;Neuromuscular education;Visual/perceptual remediation/compensation;Patient/family education;Therapeutic activities;DME and/or AE instruction;Passive range of motion;Moist Heat;Electrical Stimulation    Consulted and Agree with Plan of Care Patient            Leta Speller, MS, OTR/L   Darleene Cleaver, OT 11/04/2021, 8:22 AM

## 2021-11-04 NOTE — Telephone Encounter (Signed)
Spoke with patient's mother [name] and have scheduled a MyChart Palliative Consult for 11/16/21 @ 9 AM.

## 2021-11-08 ENCOUNTER — Ambulatory Visit: Payer: BC Managed Care – PPO

## 2021-11-08 DIAGNOSIS — I6930 Unspecified sequelae of cerebral infarction: Secondary | ICD-10-CM

## 2021-11-08 DIAGNOSIS — R2689 Other abnormalities of gait and mobility: Secondary | ICD-10-CM

## 2021-11-08 DIAGNOSIS — M6281 Muscle weakness (generalized): Secondary | ICD-10-CM | POA: Diagnosis not present

## 2021-11-08 DIAGNOSIS — R2681 Unsteadiness on feet: Secondary | ICD-10-CM

## 2021-11-08 DIAGNOSIS — R278 Other lack of coordination: Secondary | ICD-10-CM

## 2021-11-08 DIAGNOSIS — R269 Unspecified abnormalities of gait and mobility: Secondary | ICD-10-CM

## 2021-11-08 DIAGNOSIS — R262 Difficulty in walking, not elsewhere classified: Secondary | ICD-10-CM | POA: Diagnosis not present

## 2021-11-08 NOTE — Therapy (Signed)
OUTPATIENT PHYSICAL THERAPY TREATMENT NOTE   Patient Name: Paul Hall MRN: [id number] DOB:[birth year], 26 y.o., male Today's Date: 11/08/2021  PCP:  Paul Hall PROVIDER:  Collene Mares, PA-C   PT End of Session - 11/08/21 1547     Visit Number 54    Number of Visits 61    Date for PT Re-Evaluation 12/08/21    Authorization Type [employer] and [employer]- Need auth past 12th visit    Authorization Time Period 01/04/21-03/29/21; Recert 23/41/4436-0/16/5800; Recert 6/34/9494- 07/09/3956    Progress Note Due on Visit 60    PT Start Time 1600    PT Stop Time 1630    PT Time Calculation (min) 30 min    Equipment Utilized During Treatment Gait belt   Hoyer lift   Activity Tolerance Patient tolerated treatment well   queasiness/nausea   Behavior During Therapy WFL for tasks assessed/performed              Past Medical History:  Diagnosis Date   Diabetes mellitus ([place])    Hypertension    Stroke [facility])    Past Surgical History:  Procedure Laterality Date   IVC FILTER PLACEMENT ([place] HX)     LEG SURGERY     PEG TUBE PLACEMENT     TRACHEOSTOMY     Patient Active Problem List   Diagnosis Date Noted   Sepsis due to vancomycin resistant Enterococcus species ([place]) 06/06/2019   SIRS (systemic inflammatory response syndrome) ([place]) 06/05/2019   Acute lower UTI 06/05/2019   VRE (vancomycin-resistant Enterococci) infection 06/05/2019   Anemia 06/05/2019   Skin ulcer of sacrum with necrosis of muscle ([place])    Urinary retention    Type 2 diabetes mellitus without complication, with long-term current use of insulin (HCC)    Tachycardia    Lower extremity edema    Acute metabolic encephalopathy    Obstructive sleep apnea    Morbid obesity with BMI of 60.0-69.9, adult ([place])    Goals of care, counseling/discussion    Palliative care encounter    Sepsis ([place]) 04/27/2019   H/O insulin dependent diabetes mellitus 04/27/2019   History of CVA with residual deficit 04/27/2019    Seizure disorder ([place]) 04/27/2019   Decubitus ulcer of sacral region, stage 4 ([place]) 04/27/2019    REFERRING DIAG: Cerebral infarction, unspecified   THERAPY DIAG:  Muscle weakness (generalized)  Other lack of coordination  History of CVA with residual deficit  Difficulty in walking, not elsewhere classified  Unsteadiness on feet  Abnormality of gait and mobility  Other abnormalities of gait and mobility  Rationale for Evaluation and Treatment Rehabilitation  PERTINENT HISTORY: Paul Hall is a 25yoM who presents with severe weakness, quadriparesis, altered sensorium, and visual impairment s/p critical illness and prolonged hospitalization. Pt hospitalized in October 2020 with ARDS 2/2 COVID19 infection. Pt sustained a complex and lengthy hospitalization which included tracheostomy, prolonged sedation, ECMO. In this period pt sustained CVA and SDH. Pt has now been liberated from tracheostomy and G-tube. Pt has since been hospitalized for wound infection and UTI. Pt lives with parents at home, has hospital bed and left chair, hoyer lift transfers, and power WC for mobility needs. Pt needs heavy physical assistance with ADL 2/2 BUE contractures and motor dysfunction   PRECAUTIONS: Fall  SUBJECTIVE: Patient reports he is late due to having transportation issues. He also reports not feeling well today stating did not sleep well and just feels like he might be getting a little under the weather.  PAIN:  Are you having pain? No     TODAY'S TREATMENT:   Treatment - limited to the following as patient reported acute onset of mid back pain just prior to today's visit. Patient provided moist heat to Mid back while performing the following:   Therapeutic Exercises:  PROM- Hamstring stretch BLE x 30 sec x 3 each LE PROM- Hip ER/IR BLE x 30 sec x 3 each LE Hip circles x 20 reps CW then 20 more CCW Each LE Hip abduction stretch - PT pushing knees together x 30 sec x 3 each  LE  Seated hip march- 15 reps each LE (AAROM) * reported mild increase in mid back pain secondary to straining.  Seated Knee ext- 15 reps each LE (AAROM)   Seated hip abd  x 15 reps each LE Seated Hip add  x15 reps each LE.     Education provided throughout session via VC/TC and demonstration to facilitate movement at target joints and correct muscle activation for all testing and exercises performed.       PATIENT EDUCATION: Education details: posture cues with sitting Person educated: Patient Education method: Explanation, Demonstration, Tactile cues, and Verbal cues Education comprehension: verbalized understanding, returned demonstration, verbal cues required, tactile cues required, and needs further education   HOME EXERCISE PROGRAM:    PT Short Term Goals -       PT SHORT TERM GOAL #1   Title Pt will be independent with HEP in order to improve strength and balance in order to decrease fall risk and improve function at home and work.    Baseline 01/04/2021= No formal HEP in place; 12/12 no HEP in place; 05/10/2021-Patient and his father were able to report compliance with curent HEP consisting of mostly seated/reclined LE strengthening. Both verbalize no questions at this time.    Time 6    Period Weeks    Status Achieved    Target Date 02/15/21              PT Long Term Goals -      PT LONG TERM GOAL #1   Title Patient will increase BLE gross strength by 1/2 muscle grade to improve functional strength for improved independence with potential gait, increased standing tolerance and increased ADL ability.    Baseline 01/04/2021- Patient presents with 1/5 to 3-/5 B LE strength with MMT; 12/12: goal partially met for Left knee/hip; 05/10/2021= 2-/5 bilateal Hip flex; 3+/5 bilateral Knee ext; 06/21/2021= Patient presents with 2-/5 bilateral Hip flex; 3+/5 bilateral knee ext/flex; 2-/5 left ankle DF; 0/5 right ankle- and able to increase reps and resistance with LE's. 09/15/2021-  Patient technically presents with 2-/5 B hip flex/abd/add - but he is able to raise his hip up to approx 100 deg which has improved. 3+/5 Bilateral knee ext, 2-/5 left ankle and 0/5 right ankle.    Time 12    Period Weeks    Status On-going    Target Date 12/08/21      PT LONG TERM GOAL #2   Title Patient will tolerate sitting unsupported demonstrating erect sitting posture for 15 minutes with CGA to demonstrate improved back extensor strength and improved sitting tolerance.    Baseline 01/04/2021- Patient confied to sitting in lift chair or electric power chair with back support and unable to sit upright without physical assistance; 12/12: tolerates <1 minutes upright unsupported sitting. 05/10/2021=static sit with forward trunk lean  in his power wheelchair without back support x approx 3 min. 06/21/2021=Unable  to assess today due to patient with acute back pain but on previous visit able to sit x 8 min without back support. 09/15/2021- on last visit- 09/13/2021- patient was able to sit unsupported x 8 min at edge of mat. 10/13/2023 - Patient was able to sit at edge of mat with varying level of assist today from SBA to min A for a total of 20 min.    Time 12    Period Weeks    Status On-going    Target Date 12/08/21      PT LONG TERM GOAL #3   Title Patient will demonstrate ability to perform static standing in // bars > 2 min with Max Assist  without loss of balance and fair posture for improved overall strength for pre-gait and transfer activities.    Baseline 01/04/2021= Patient current uanble to stand- Dependent on hoyer or sit to stand lift for transfers. 05/10/2021=Not appropriate yet- Currently still dependent with all transfers using hoyer. 06/21/2021= Patient continuing now to focus on LE strengthening to prepare for standing-unable to try today due to acute low back pain-  planning on attempting in new cert period. 09/15/2021- Patient has attempted standing 2x in past two week- max Assist of 2 people  - only once was he successful to clearing his bottom from chair - Will continue to be a focus during the new certification.    Time 12    Period Weeks    Status On-going    Target Date 12/08/21      PT LONG TERM GOAL #4   Title Pt will improve FOTO score by 10 points or more demonstrating improved perceived functional ability    Baseline FOTO 7 on 10/17; 03/15/21: FOTO 12; 05/10/2021 06/21/2021= 1; 09/15/2021= 9    Time 12    Period Weeks    Status On-going    Target Date 12/08/21      PT LONG TERM GOAL #5   Title Patient will perform sit to stand transfer with appropriate AD and max assist of 2 people with 75% consistency to prepare for pregait activities.    Baseline 09/15/2021= Patient unable to stand well- unable to clear his bottom off chair with Max assist of 2 persons.    Time 12    Period Weeks    Status New    Target Date 12/08/21              Plan     Clinical Impression Statement Treatment limited to seated therex while moist heat to mid back. Patient did report some pain with exercises and limited in participation today. He performed as many reps as he could and stated no worse with pain upon completion. Patient would benefit from additional skilled PT intervention to improve strength, balance and mobility for improved quality of life.    Personal Factors and Comorbidities Comorbidity 3+;Time since onset of injury/illness/exacerbation    Comorbidities CVA, diabetes, Seizures    Examination-Activity Limitations Bathing;Bed Mobility;Bend;Caring for Others;Carry;Dressing;Hygiene/Grooming;Lift;Locomotion Level;Reach Overhead;Self Feeding;Sit;Squat;Stairs;Stand;Transfers;Toileting    Examination-Participation Restrictions Cleaning;Community Activity;Driving;Laundry;Medication Management;Meal Prep;Occupation;Personal Finances;Shop;Yard Work;Volunteer    Stability/Clinical Decision Making Evolving/Moderate complexity    Rehab Potential Fair    PT Frequency 2x / week    PT  Duration 12 weeks    PT Treatment/Interventions ADLs/Self Care Home Management;Cryotherapy;Electrical Stimulation;Moist Heat;Ultrasound;DME Instruction;Gait training;Stair training;Functional mobility training;Therapeutic exercise;Balance training;Patient/family education;Orthotic Fit/Training;Neuromuscular re-education;Wheelchair mobility training;Manual techniques;Passive range of motion;Dry needling;Energy conservation;Taping;Visual/perceptual remediation/compensation;Joint Manipulations    PT Next Visit Plan core strength- sitting at EOM, postural  control, sabina sit to stand if able; Continue with progressive LE Strengthening    PT Home Exercise Plan No changes to HEP today    Consulted and Agree with Plan of Care Patient;Family member/caregiver    Family Member Consulted Dad               Lewis Moccasin, PT 11/08/2021, 4:58 PM

## 2021-11-09 DIAGNOSIS — Z6841 Body Mass Index (BMI) 40.0 and over, adult: Secondary | ICD-10-CM | POA: Diagnosis not present

## 2021-11-09 DIAGNOSIS — I824Z9 Acute embolism and thrombosis of unspecified deep veins of unspecified distal lower extremity: Secondary | ICD-10-CM | POA: Diagnosis not present

## 2021-11-09 NOTE — Therapy (Signed)
OUTPATIENT OCCUPATIONAL THERAPY TREATMENT NOTE   Patient Name: Paul Hall MRN: [id number] DOB:1995-04-28, 26 y.o., male Today's Date: 11/09/2021  REFERRING PROVIDER:  Lenise Herald, MD .otendofsession   OT End of Session -        Visit number                                                                61      Number of visits                                                          96      Date of Revaluation                                                    12/07/2021  OT Start Time 1510   OT Stop Time 1605   OT Time Calculation (min) 45 min    Activity Tolerance Patient tolerated treatment well    Behavior During Therapy [facility] for tasks assessed/performed         .    Past Medical History:  Diagnosis Date   Diabetes mellitus (HCC)    Hypertension    Stroke [facility])    Past Surgical History:  Procedure Laterality Date   IVC FILTER PLACEMENT ([facility] HX)     LEG SURGERY     PEG TUBE PLACEMENT     TRACHEOSTOMY     Patient Active Problem List   Diagnosis Date Noted   Sepsis due to vancomycin resistant Enterococcus species (HCC) 06/06/2019   SIRS (systemic inflammatory response syndrome) (HCC) 06/05/2019   Acute lower UTI 06/05/2019   VRE (vancomycin-resistant Enterococci) infection 06/05/2019   Anemia 06/05/2019   Skin ulcer of sacrum with necrosis of muscle (HCC)    Urinary retention    Type 2 diabetes mellitus without complication, with long-term current use of insulin (HCC)    Tachycardia    Lower extremity edema    Acute metabolic encephalopathy    Obstructive sleep apnea    Morbid obesity with BMI of 60.0-69.9, adult (HCC)    Goals of care, counseling/discussion    Palliative care encounter    Sepsis (HCC) 04/27/2019   H/O insulin dependent diabetes mellitus 04/27/2019   History of CVA with residual deficit 04/27/2019   Seizure disorder (HCC) 04/27/2019   Decubitus ulcer of sacral region, stage 4 (HCC) 04/27/2019      REFERRING DIAG: CVA,  COVID-19  THERAPY DIAG:  Muscle weakness (generalized)  Other lack of coordination  History of CVA with residual deficit  Rationale for Evaluation and Treatment Rehabilitation  PERTINENT HISTORY: Pt. is a 26 y.o. male who was hospitalized, and diagnosed with COVID-19, sustained a CVA with resultant quadreplegia in 01/2019. Pt. was hospitalized with VRE  UTI. Pt. PMHx includes: urinary retention, Seizure Disorder, Obstructive Sleep Disorder, DM Type II, and Morbid obesity. Pt. resides at home with his parents  PRECAUTIONS:  No  SUBJECTIVE: Pt reports that he's feeling better today and that his cold symptoms only lasted for a day last week.    PAIN:  Are you having pain? No pain at rest, 5-6/10 pain in bilat hands with passive stretching at end range.  OBJECTIVE:   TODAY'S TREATMENT:  Moist heat applied to R/L hands x 5 min for muscle relaxation in prep for therapeutic exercises.  Therapeutic Exercise: Pt. tolerated bilateral wrist flexion, extension, and MP, PIP, and DIP flexion, and extension.  Completed grip strengthening with use of 1.5# digi-flex for 2 sets 10 reps in each hand.  Pt required assist with set up of digi-flex in each hand, and passive digit and wrist extension stretching and WB to hand on table top between sets to help with tone reduction.  Attempted UBE trial, but R hand digits did not tolerate a passive hold from OT to grip handle.    Neuro re-ed: Facilitated grasp/release working to grasp jumbo pegs from OT and reach forward to release into container.  Pt tried removing pegs from pegboard  but was successful with a few on the L hand, but mostly failed attempts on the R.  OT provided vc for engaging R thumb into each grasp.    PATIENT EDUCATION: Education details: grasp/release activities at table top level Person educated: pt Education method: Solicitor, and Verbal cues Education comprehension: verbalized understanding, returned demonstration,  verbal cues required, and needs further education   HOME EXERCISE PROGRAM   BUE, hand, and digit ROM, theraputty exercises    OT Short Term Goals - 09/13/21 1641       OT SHORT TERM GOAL #1   Title Pt and caregiver will obtain splint and follow wearing schedule consistently 7 days/week    Baseline 09/14/21: working to obtain splint    Time 6    Period Weeks    Status New    Target Date 10/26/21              OT Long Term Goals - 09/13/21 1623       OT LONG TERM GOAL #1   Title Pt. will improve FOTO score by 5 points for clinically significant improvements during ADLs, and IADLs,    Baseline 30th visit: FOTO: 37, 21st: FOTO 43. 10th Visit: FOTO score 42 Eval: FOTO score: 41 FOTO score: 39; 50th: FOTO 52; 09/13/21: FOTO 41    Time 12    Period Weeks    Status On-going    Target Date 12/07/21      OT LONG TERM GOAL #2   Title Pt. will improve right shoulder flexion by 15 degrees to assist caregivers with UE dressing    Baseline Pt. continues to require assist with UE dressing secondary to limited ROM; 08/16/21: Max A-total A UB dressing; R shoulder flexion/abd combo 0-100; 09/13/21: R shoulder flexion/abd combo 0-110, R shoulder hiking noted    Time 12    Period Weeks    Status Revised    Target Date 12/07/21      OT LONG TERM GOAL #3   Title Pt. will improve right Abduction by 10 degrees to assist caregivers with underarm hygiene care.    Baseline Eval: AROM abduction RUE 76. Pt. continues to present with limited bilateral abduction. 21st: AROM abduction RUE 80.06/16/2021: Right shulder abduction 108. 06/23/2021: Right shoulder abduction: 110; 08/16/21: abd 110    Time 12    Period Weeks    Status Achieved    Target Date 09/08/21  OT LONG TERM GOAL #4   Title Pt. will demonstrate visual compensatory strategies for navigating through his environment.    Baseline 30th visit: Pt. continues to require assist to navigate through his environment. 20th: Pt continues to require  assist to navigate environment, minimal knowledge of strategies. Eval: Limited 06/23/2021: Pt. continues to be limited, and requires assist  with navigating through his environment; 08/16/21: father reports pt only steers his own wc in 1 open room of their house, otherwise dep to navigate environment. 09/13/21: limited steering outside of house, steers in 1 room of house. 09/13/21: pt seeing low vision OT specialist to address.    Time 12    Period Weeks    Status Deferred    Target Date 09/08/21      OT LONG TERM GOAL #5   Title Pt. will demonstrate visual compensatory strategies for tabletop tasks within his near space.    Baseline 06/16/2021: Pt. continues to present with visual limitations for tabletop tasks in his near space. Pt. continues to present with limited vision. 21st: Pt can identify objects on tabletop ~waterbottle sized, improves with contrasting colours, cannot ID small onjects; 08/16/21: no change from last assessment period. 09/13/21: pt seeing low vision OT specialist to address.    Time 12    Period Weeks    Status Deferred    Target Date 09/08/21      OT LONG TERM GOAL #6   Title Pt. will perform self feeding with SETUP using adaptive equipment    Baseline 06/23/2021: Pt. is able to use his bilateral hands to perfrom hand to mouth patterns for self-feeding with finger foods. Pt. is now able to use his left hand to bring a water bottle to his mouth,a nd drink from it. 30th visit: Pt. continues to present with weakness limiting functional hand to mouth patterns of movement. Pt. is able to perform hand to mouth patterns, however reports weakness in his hand  limits a secure grasp on the utensil. Pt. requires assist to perform; Eval: Pt. has difficulty; 08/16/21: pt continues to use L hand for self feeding finger foods with set up.  Spasticity limits dexterity to manipulate eating utensils. 09/13/21: can grip built up handle however requires MIN A 2/2 vision and wrist ROM deficits.    Time  12    Period Weeks    Status Revised    Target Date 12/07/21      OT LONG TERM GOAL #7   Title Pt. will perform one step self-grooming tasks with SETUP    Baseline 06/23/2021: Pt. is able to perform the hand to face pattern with the left hand, however has difficulty actively sustaining his BUEs in elevation long enough to perform self-grooming tasks. 06/16/2021: Pt. is able to perform the hand to face pattern with the left hand, however has difficulty actively sustaining his BUEs in elevation long enough to perform self-grooming tasks; 08/16/21: same as 06/16/21. 09/13/21: limited by trunk and sustaining BUEs    Time 12    Period Weeks    Status Revised    Target Date 12/07/21      OT LONG TERM GOAL #8   Title Pt will complete UB dressing tasks with SETUP and assist to thread over hands only    Baseline 06/23/2021: Pt. requires MinA to thread arms, and requires maxA to donn over upper arms, MaxAx2 to donn over his back. 06/16/2021: Pt. has progressed  to require MinA to thread arms, and requires maxA to donn over  upper arms, MaxAx2 to donn over his back; 08/16/21: min A to thread arms, max A to don over upper arms and over head. 09/13/21: MOD A    Time 12    Period Weeks    Status On-going    Target Date 12/07/21      OT LONG TERM GOAL  #9   TITLE Pt will improve B grip strength by 10# to assist in handling ADL items    Baseline 08/16/21: bilat girp 0#; 09/13/21: L grip 6#, R grip 9#    Time 12    Period Weeks    Status Revised    Target Date 12/06/21      OT LONG TERM GOAL  #10   TITLE Pt will improve B lateral pinch strength by 8# to assist in UBD.    Baseline N/A: awaiting pinch meter to be fixed. Pt. unable to formulate proper positioning for lateral pinch assessment of the replacement pinch meter; 08/16/21: R 2#, L 1.5#; ; 09/13/21: R 6#, L 2#    Time 12    Period Weeks    Status Revised    Target Date 12/07/21              Plan - 09/23/21 1416     Clinical Impression Statement  Not yet able to tolerate UBE d/t pain in the R hand digits when attempting to grasp the UBE handle.  Pt practiced grasp/release in the R/L hands.  Pt took the pegs from OT 's hand d/t increased difficulty attempting to remove pegs from pegboard, vc cues for engaging R thumb, noting most difficulty with thumb abd.  Pt  continues to benefit from OT services to improve ROM, and facilitate active volitional movement in order to work towards engaging his BUEs in daily ADLs, and IADL tasks.     OT Occupational Profile and History Detailed Assessment- Review of Records and additional review of physical, cognitive, psychosocial history related to current functional performance    Occupational performance deficits (Please refer to evaluation for details): ADL's;IADL's    Body Structure / Function / Physical Skills ADL;FMC;ROM;Dexterity;IADL;Endurance;Obesity;Strength;UE functional use    Rehab Potential Good    Clinical Decision Making Multiple treatment options, significant modification of task necessary    Comorbidities Affecting Occupational Performance: Presence of comorbidities impacting occupational performance    Modification or Assistance to Complete Evaluation  Max significant modification of tasks or assist is necessary to complete    OT Frequency 2x / week    OT Duration 12 weeks    OT Treatment/Interventions Self-care/ADL training;Therapeutic exercise;Neuromuscular education;Visual/perceptual remediation/compensation;Patient/family education;Therapeutic activities;DME and/or AE instruction;Passive range of motion;Moist Heat;Electrical Stimulation    Consulted and Agree with Plan of Care Patient            Danelle Earthly, MS, OTR/L   Otis Dials, OT 11/09/2021, 8:40 AM

## 2021-11-10 ENCOUNTER — Ambulatory Visit: Payer: BC Managed Care – PPO

## 2021-11-12 ENCOUNTER — Telehealth: Payer: Self-pay | Admitting: Student

## 2021-11-12 NOTE — Telephone Encounter (Signed)
Ret'd call to patient's mother Wenday, she needed to reschedule the [place] on 8/15 due to her work schedule, this was rescheduled for 11/23/21 @ 10:30 AM.

## 2021-11-15 ENCOUNTER — Ambulatory Visit: Payer: BC Managed Care – PPO

## 2021-11-15 DIAGNOSIS — R2689 Other abnormalities of gait and mobility: Secondary | ICD-10-CM | POA: Diagnosis not present

## 2021-11-15 DIAGNOSIS — R262 Difficulty in walking, not elsewhere classified: Secondary | ICD-10-CM

## 2021-11-15 DIAGNOSIS — I6930 Unspecified sequelae of cerebral infarction: Secondary | ICD-10-CM

## 2021-11-15 DIAGNOSIS — R278 Other lack of coordination: Secondary | ICD-10-CM

## 2021-11-15 DIAGNOSIS — M6281 Muscle weakness (generalized): Secondary | ICD-10-CM | POA: Diagnosis not present

## 2021-11-15 DIAGNOSIS — R2681 Unsteadiness on feet: Secondary | ICD-10-CM | POA: Diagnosis not present

## 2021-11-15 DIAGNOSIS — R269 Unspecified abnormalities of gait and mobility: Secondary | ICD-10-CM

## 2021-11-16 ENCOUNTER — Telehealth: Payer: BC Managed Care – PPO | Admitting: Student

## 2021-11-16 NOTE — Therapy (Signed)
OUTPATIENT OCCUPATIONAL THERAPY TREATMENT NOTE   Patient Name: Paul Hall MRN: [id number] DOB:Aug 30, 1995, 26 y.o., male Today's Date: 11/16/2021  REFERRING PROVIDER:  Lenise Herald, MD .otendofsession   OT End of Session - 11/16/21 1132     Visit Number 66    Number of Visits 96    Date for OT Re-Evaluation 12/07/21    Authorization Type Progress report period starting 10/06/2021    OT Start Time 1515    OT Stop Time 1600    OT Time Calculation (min) 45 min    Equipment Utilized During Treatment tilt in space power wc    Activity Tolerance Patient tolerated treatment well    Behavior During Therapy WFL for tasks assessed/performed              Past Medical History:  Diagnosis Date   Diabetes mellitus (HCC)    Hypertension    Stroke (HCC)    Past Surgical History:  Procedure Laterality Date   IVC FILTER PLACEMENT ([facility] HX)     LEG SURGERY     PEG TUBE PLACEMENT     TRACHEOSTOMY     Patient Active Problem List   Diagnosis Date Noted   Sepsis due to vancomycin resistant Enterococcus species (HCC) 06/06/2019   SIRS (systemic inflammatory response syndrome) (HCC) 06/05/2019   Acute lower UTI 06/05/2019   VRE (vancomycin-resistant Enterococci) infection 06/05/2019   Anemia 06/05/2019   Skin ulcer of sacrum with necrosis of muscle (HCC)    Urinary retention    Type 2 diabetes mellitus without complication, with long-term current use of insulin (HCC)    Tachycardia    Lower extremity edema    Acute metabolic encephalopathy    Obstructive sleep apnea    Morbid obesity with BMI of 60.0-69.9, adult (HCC)    Goals of care, counseling/discussion    Palliative care encounter    Sepsis (HCC) 04/27/2019   H/O insulin dependent diabetes mellitus 04/27/2019   History of CVA with residual deficit 04/27/2019   Seizure disorder (HCC) 04/27/2019   Decubitus ulcer of sacral region, stage 4 (HCC) 04/27/2019      REFERRING DIAG: CVA, COVID-19  THERAPY DIAG:   Muscle weakness (generalized)  Other lack of coordination  History of CVA with residual deficit  Rationale for Evaluation and Treatment Rehabilitation  PERTINENT HISTORY: Pt. is a 26 y.o. male who was hospitalized, and diagnosed with COVID-19, sustained a CVA with resultant quadreplegia in 01/2019. Pt. was hospitalized with VRE  UTI. Pt. PMHx includes: urinary retention, Seizure Disorder, Obstructive Sleep Disorder, DM Type II, and Morbid obesity. Pt. resides at home with his parents  PRECAUTIONS: No  SUBJECTIVE: Pt reports doing well today and had a restful weekend.  PAIN:  Are you having pain? No pain at rest, 5-6/10 pain in bilat hands with passive stretching at end range.  OBJECTIVE:   TODAY'S TREATMENT:  Moist heat applied to R/L hands x 5 min for muscle relaxation in prep for therapeutic exercises.  Therapeutic Exercise: Pt. tolerated bilateral wrist flexion, extension, and MP, PIP, and DIP flexion, and extension.  Completed grip strengthening with use of 1.5# digi-flex for 3 sets 10 reps in each hand.  Pt required assist with set up of digi-flex in each hand, and passive digit and wrist extension stretching and WB to hand on table top between sets to help with tone reduction.  Worked lateral pinch using built up foam grip between thumb and lateral aspect of IF for resistance, squeezing 3 sets 10  reps each hand.   Neuro re-ed: Facilitated grasp/release in bilat hands, working to grasp jumbo pegs from pegboard and release pegs into container.  Pt required mod vc for grasp patterns, sequencing, and technique, occasional min A on the R to secure thumb around peg.    PATIENT EDUCATION: Education details: grasp/release activities at table top level Person educated: pt Education method: Solicitor, and Verbal cues Education comprehension: verbalized understanding, returned demonstration, verbal cues required, and needs further education   HOME EXERCISE PROGRAM    BUE, hand, and digit ROM, theraputty exercises    OT Short Term Goals - 09/13/21 1641       OT SHORT TERM GOAL #1   Title Pt and caregiver will obtain splint and follow wearing schedule consistently 7 days/week    Baseline 09/14/21: working to obtain splint    Time 6    Period Weeks    Status New    Target Date 10/26/21              OT Long Term Goals - 09/13/21 1623       OT LONG TERM GOAL #1   Title Pt. will improve FOTO score by 5 points for clinically significant improvements during ADLs, and IADLs,    Baseline 30th visit: FOTO: 37, 21st: FOTO 43. 10th Visit: FOTO score 42 Eval: FOTO score: 41 FOTO score: 39; 50th: FOTO 52; 09/13/21: FOTO 41    Time 12    Period Weeks    Status On-going    Target Date 12/07/21      OT LONG TERM GOAL #2   Title Pt. will improve right shoulder flexion by 15 degrees to assist caregivers with UE dressing    Baseline Pt. continues to require assist with UE dressing secondary to limited ROM; 08/16/21: Max A-total A UB dressing; R shoulder flexion/abd combo 0-100; 09/13/21: R shoulder flexion/abd combo 0-110, R shoulder hiking noted    Time 12    Period Weeks    Status Revised    Target Date 12/07/21      OT LONG TERM GOAL #3   Title Pt. will improve right Abduction by 10 degrees to assist caregivers with underarm hygiene care.    Baseline Eval: AROM abduction RUE 76. Pt. continues to present with limited bilateral abduction. 21st: AROM abduction RUE 80.06/16/2021: Right shulder abduction 108. 06/23/2021: Right shoulder abduction: 110; 08/16/21: abd 110    Time 12    Period Weeks    Status Achieved    Target Date 09/08/21      OT LONG TERM GOAL #4   Title Pt. will demonstrate visual compensatory strategies for navigating through his environment.    Baseline 30th visit: Pt. continues to require assist to navigate through his environment. 20th: Pt continues to require assist to navigate environment, minimal knowledge of strategies. Eval: Limited  06/23/2021: Pt. continues to be limited, and requires assist  with navigating through his environment; 08/16/21: father reports pt only steers his own wc in 1 open room of their house, otherwise dep to navigate environment. 09/13/21: limited steering outside of house, steers in 1 room of house. 09/13/21: pt seeing low vision OT specialist to address.    Time 12    Period Weeks    Status Deferred    Target Date 09/08/21      OT LONG TERM GOAL #5   Title Pt. will demonstrate visual compensatory strategies for tabletop tasks within his near space.    Baseline 06/16/2021: Pt. continues  to present with visual limitations for tabletop tasks in his near space. Pt. continues to present with limited vision. 21st: Pt can identify objects on tabletop ~waterbottle sized, improves with contrasting colours, cannot ID small onjects; 08/16/21: no change from last assessment period. 09/13/21: pt seeing low vision OT specialist to address.    Time 12    Period Weeks    Status Deferred    Target Date 09/08/21      OT LONG TERM GOAL #6   Title Pt. will perform self feeding with SETUP using adaptive equipment    Baseline 06/23/2021: Pt. is able to use his bilateral hands to perfrom hand to mouth patterns for self-feeding with finger foods. Pt. is now able to use his left hand to bring a water bottle to his mouth,a nd drink from it. 30th visit: Pt. continues to present with weakness limiting functional hand to mouth patterns of movement. Pt. is able to perform hand to mouth patterns, however reports weakness in his hand  limits a secure grasp on the utensil. Pt. requires assist to perform; Eval: Pt. has difficulty; 08/16/21: pt continues to use L hand for self feeding finger foods with set up.  Spasticity limits dexterity to manipulate eating utensils. 09/13/21: can grip built up handle however requires MIN A 2/2 vision and wrist ROM deficits.    Time 12    Period Weeks    Status Revised    Target Date 12/07/21      OT LONG  TERM GOAL #7   Title Pt. will perform one step self-grooming tasks with SETUP    Baseline 06/23/2021: Pt. is able to perform the hand to face pattern with the left hand, however has difficulty actively sustaining his BUEs in elevation long enough to perform self-grooming tasks. 06/16/2021: Pt. is able to perform the hand to face pattern with the left hand, however has difficulty actively sustaining his BUEs in elevation long enough to perform self-grooming tasks; 08/16/21: same as 06/16/21. 09/13/21: limited by trunk and sustaining BUEs    Time 12    Period Weeks    Status Revised    Target Date 12/07/21      OT LONG TERM GOAL #8   Title Pt will complete UB dressing tasks with SETUP and assist to thread over hands only    Baseline 06/23/2021: Pt. requires MinA to thread arms, and requires maxA to donn over upper arms, MaxAx2 to donn over his back. 06/16/2021: Pt. has progressed  to require MinA to thread arms, and requires maxA to donn over upper arms, MaxAx2 to donn over his back; 08/16/21: min A to thread arms, max A to don over upper arms and over head. 09/13/21: MOD A    Time 12    Period Weeks    Status On-going    Target Date 12/07/21      OT LONG TERM GOAL  #9   TITLE Pt will improve B grip strength by 10# to assist in handling ADL items    Baseline 08/16/21: bilat girp 0#; 09/13/21: L grip 6#, R grip 9#    Time 12    Period Weeks    Status Revised    Target Date 12/06/21      OT LONG TERM GOAL  #10   TITLE Pt will improve B lateral pinch strength by 8# to assist in UBD.    Baseline N/A: awaiting pinch meter to be fixed. Pt. unable to formulate proper positioning for lateral pinch assessment of the  replacement pinch meter; 08/16/21: R 2#, L 1.5#; ; 09/13/21: R 6#, L 2#    Time 12    Period Weeks    Status Revised    Target Date 12/07/21              Plan - 09/23/21 1416     Clinical Impression Statement Pt continues to develop grip and pinch strength in bilat hands for improving  manipulation of ADL supplies.  Pt is more consistent on the L, with more difficulty on the R with more dropped pegs, repeated attempts, and increased effort.  Pt uses a claw type grasp in bilat hands d/t spasticity.  Pt requires frequent rest breaks and intermittent stretch breaks to recline in his wc d/t reported back pain when engaging BUEs at table top level.  Pt  continues to benefit from OT services to improve ROM, and facilitate active volitional movement in order to work towards engaging his BUEs in daily ADLs, and IADL tasks.     OT Occupational Profile and History Detailed Assessment- Review of Records and additional review of physical, cognitive, psychosocial history related to current functional performance    Occupational performance deficits (Please refer to evaluation for details): ADL's;IADL's    Body Structure / Function / Physical Skills ADL;FMC;ROM;Dexterity;IADL;Endurance;Obesity;Strength;UE functional use    Rehab Potential Good    Clinical Decision Making Multiple treatment options, significant modification of task necessary    Comorbidities Affecting Occupational Performance: Presence of comorbidities impacting occupational performance    Modification or Assistance to Complete Evaluation  Max significant modification of tasks or assist is necessary to complete    OT Frequency 2x / week    OT Duration 12 weeks    OT Treatment/Interventions Self-care/ADL training;Therapeutic exercise;Neuromuscular education;Visual/perceptual remediation/compensation;Patient/family education;Therapeutic activities;DME and/or AE instruction;Passive range of motion;Moist Heat;Electrical Stimulation    Consulted and Agree with Plan of Care Patient            Danelle Earthly, MS, OTR/L   Otis Dials, OT 11/16/2021, 11:53 AM

## 2021-11-16 NOTE — Therapy (Addendum)
OUTPATIENT PHYSICAL THERAPY TREATMENT NOTE   Patient Name: Paul Hall MRN: [id number] DOB:1995-10-29, 26 y.o., male Today's Date: 11/16/2021  PCP:  Loistine Chance PROVIDER:  Collene Mares, PA-C   PT End of Session - 11/15/21 1026     Visit Number 55    Number of Visits 61    Date for PT Re-Evaluation 12/08/21    Authorization Type [employer] and [employer]- Need auth past 12th visit    Authorization Time Period 01/04/21-03/29/21; Recert [id number]; Recert 9/52/8413- 05/08/4008    Progress Note Due on Visit 15    PT Start Time 1604    PT Stop Time 2725    PT Time Calculation (min) 35 min    Equipment Utilized During Treatment Gait belt   Hoyer lift   Activity Tolerance Patient tolerated treatment well   queasiness/nausea   Behavior During Therapy WFL for tasks assessed/performed              Past Medical History:  Diagnosis Date   Diabetes mellitus ([place])    Hypertension    Stroke [facility])    Past Surgical History:  Procedure Laterality Date   IVC FILTER PLACEMENT ([facility] HX)     LEG SURGERY     PEG TUBE PLACEMENT     TRACHEOSTOMY     Patient Active Problem List   Diagnosis Date Noted   Sepsis due to vancomycin resistant Enterococcus species ([place]) 06/06/2019   SIRS (systemic inflammatory response syndrome) ([place]) 06/05/2019   Acute lower UTI 06/05/2019   VRE (vancomycin-resistant Enterococci) infection 06/05/2019   Anemia 06/05/2019   Skin ulcer of sacrum with necrosis of muscle ([place])    Urinary retention    Type 2 diabetes mellitus without complication, with long-term current use of insulin (HCC)    Tachycardia    Lower extremity edema    Acute metabolic encephalopathy    Obstructive sleep apnea    Morbid obesity with BMI of 60.0-69.9, adult ([place])    Goals of care, counseling/discussion    Palliative care encounter    Sepsis ([place]) 04/27/2019   H/O insulin dependent diabetes mellitus 04/27/2019   History of CVA with residual deficit 04/27/2019    Seizure disorder ([place]) 04/27/2019   Decubitus ulcer of sacral region, stage 4 ([place]) 04/27/2019    REFERRING DIAG: Cerebral infarction, unspecified   THERAPY DIAG:  Muscle weakness (generalized)  Other lack of coordination  History of CVA with residual deficit  Difficulty in walking, not elsewhere classified  Unsteadiness on feet  Abnormality of gait and mobility  Other abnormalities of gait and mobility  Rationale for Evaluation and Treatment Rehabilitation  PERTINENT HISTORY: Avier Jech is a 25yoM who presents with severe weakness, quadriparesis, altered sensorium, and visual impairment s/p critical illness and prolonged hospitalization. Pt hospitalized in October 2020 with ARDS 2/2 COVID19 infection. Pt sustained a complex and lengthy hospitalization which included tracheostomy, prolonged sedation, ECMO. In this period pt sustained CVA and SDH. Pt has now been liberated from tracheostomy and G-tube. Pt has since been hospitalized for wound infection and UTI. Pt lives with parents at home, has hospital bed and left chair, hoyer lift transfers, and power WC for mobility needs. Pt needs heavy physical assistance with ADL 2/2 BUE contractures and motor dysfunction   PRECAUTIONS: Fall  SUBJECTIVE: Patient reports feeling okay today but states plan to have procedure to remove IVC filter this Friday so just taking it easy til then.  PAIN:  Are you having pain? No     TODAY'S  TREATMENT:   Patient reports going to have his laproscopic procedure- so just performed seated LE strengthening today.   Therapeutic Exercises:  PROM- Hamstring stretch BLE x 30 sec x 3 each LE PROM- Hip ER/IR BLE x 30 sec x 3 each LE Hip circles x 20 reps CW then 20 more CCW Each LE Hip abduction stretch - PT pushing knees together x 30 sec x 3 each LE  Seated hip march 2 x 15 reps each LE (AAROM)  Seated Knee ext 2 x15 reps each LE (AROM)   Seated hip abd  x 15 reps each LE Seated Hip add  x15  reps each LE.     Education provided throughout session via VC/TC and demonstration to facilitate movement at target joints and correct muscle activation for all testing and exercises performed.       PATIENT EDUCATION: Education details: posture cues with sitting Person educated: Patient Education method: Explanation, Demonstration, Tactile cues, and Verbal cues Education comprehension: verbalized understanding, returned demonstration, verbal cues required, tactile cues required, and needs further education   HOME EXERCISE PROGRAM:    PT Short Term Goals -       PT SHORT TERM GOAL #1   Title Pt will be independent with HEP in order to improve strength and balance in order to decrease fall risk and improve function at home and work.    Baseline 01/04/2021= No formal HEP in place; 12/12 no HEP in place; 05/10/2021-Patient and his father were able to report compliance with curent HEP consisting of mostly seated/reclined LE strengthening. Both verbalize no questions at this time.    Time 6    Period Weeks    Status Achieved    Target Date 02/15/21              PT Long Term Goals -      PT LONG TERM GOAL #1   Title Patient will increase BLE gross strength by 1/2 muscle grade to improve functional strength for improved independence with potential gait, increased standing tolerance and increased ADL ability.    Baseline 01/04/2021- Patient presents with 1/5 to 3-/5 B LE strength with MMT; 12/12: goal partially met for Left knee/hip; 05/10/2021= 2-/5 bilateal Hip flex; 3+/5 bilateral Knee ext; 06/21/2021= Patient presents with 2-/5 bilateral Hip flex; 3+/5 bilateral knee ext/flex; 2-/5 left ankle DF; 0/5 right ankle- and able to increase reps and resistance with LE's. 09/15/2021- Patient technically presents with 2-/5 B hip flex/abd/add - but he is able to raise his hip up to approx 100 deg which has improved. 3+/5 Bilateral knee ext, 2-/5 left ankle and 0/5 right ankle.    Time 12     Period Weeks    Status On-going    Target Date 12/08/21      PT LONG TERM GOAL #2   Title Patient will tolerate sitting unsupported demonstrating erect sitting posture for 15 minutes with CGA to demonstrate improved back extensor strength and improved sitting tolerance.    Baseline 01/04/2021- Patient confied to sitting in lift chair or electric power chair with back support and unable to sit upright without physical assistance; 12/12: tolerates <1 minutes upright unsupported sitting. 05/10/2021=static sit with forward trunk lean  in his power wheelchair without back support x approx 3 min. 06/21/2021=Unable to assess today due to patient with acute back pain but on previous visit able to sit x 8 min without back support. 09/15/2021- on last visit- 09/13/2021- patient was able to sit unsupported x 8  min at edge of mat. 10/13/2023 - Patient was able to sit at edge of mat with varying level of assist today from SBA to min A for a total of 20 min.    Time 12    Period Weeks    Status On-going    Target Date 12/08/21      PT LONG TERM GOAL #3   Title Patient will demonstrate ability to perform static standing in // bars > 2 min with Max Assist  without loss of balance and fair posture for improved overall strength for pre-gait and transfer activities.    Baseline 01/04/2021= Patient current uanble to stand- Dependent on hoyer or sit to stand lift for transfers. 05/10/2021=Not appropriate yet- Currently still dependent with all transfers using hoyer. 06/21/2021= Patient continuing now to focus on LE strengthening to prepare for standing-unable to try today due to acute low back pain-  planning on attempting in new cert period. 09/15/2021- Patient has attempted standing 2x in past two week- max Assist of 2 people - only once was he successful to clearing his bottom from chair - Will continue to be a focus during the new certification.    Time 12    Period Weeks    Status On-going    Target Date 12/08/21      PT  LONG TERM GOAL #4   Title Pt will improve FOTO score by 10 points or more demonstrating improved perceived functional ability    Baseline FOTO 7 on 10/17; 03/15/21: FOTO 12; 05/10/2021 06/21/2021= 1; 09/15/2021= 9    Time 12    Period Weeks    Status On-going    Target Date 12/08/21      PT LONG TERM GOAL #5   Title Patient will perform sit to stand transfer with appropriate AD and max assist of 2 people with 75% consistency to prepare for pregait activities.    Baseline 09/15/2021= Patient unable to stand well- unable to clear his bottom off chair with Max assist of 2 persons.    Time 12    Period Weeks    Status New    Target Date 12/08/21              Plan     Clinical Impression Statement Patient able to resume more reps with LE strengthening without report of increased back pain today. He was also able to accomplish more active right LE movement. Continued just strengthening in chair for precaution as patient has procedure planned for this Friday to remove/replace a IVC filter.  Patient would benefit from additional skilled PT intervention to improve strength, balance, and mobility for improved quality of life.    Personal Factors and Comorbidities Comorbidity 3+;Time since onset of injury/illness/exacerbation    Comorbidities CVA, diabetes, Seizures    Examination-Activity Limitations Bathing;Bed Mobility;Bend;Caring for Others;Carry;Dressing;Hygiene/Grooming;Lift;Locomotion Level;Reach Overhead;Self Feeding;Sit;Squat;Stairs;Stand;Transfers;Toileting    Examination-Participation Restrictions Cleaning;Community Activity;Driving;Laundry;Medication Management;Meal Prep;Occupation;Personal Finances;Shop;Yard Work;Volunteer    Stability/Clinical Decision Making Evolving/Moderate complexity    Rehab Potential Fair    PT Frequency 2x / week    PT Duration 12 weeks    PT Treatment/Interventions ADLs/Self Care Home Management;Cryotherapy;Electrical Stimulation;Moist Heat;Ultrasound;DME  Instruction;Gait training;Stair training;Functional mobility training;Therapeutic exercise;Balance training;Patient/family education;Orthotic Fit/Training;Neuromuscular re-education;Wheelchair mobility training;Manual techniques;Passive range of motion;Dry needling;Energy conservation;Taping;Visual/perceptual remediation/compensation;Joint Manipulations    PT Next Visit Plan core strength- sitting at EOM, postural control, sabina sit to stand if able; Continue with progressive LE Strengthening    PT Home Exercise Plan No changes to HEP today  Consulted and Agree with Plan of Care Patient;Family member/caregiver    Family Member Consulted Dad               Lewis Moccasin, PT 11/16/2021, 10:38 AM

## 2021-11-17 ENCOUNTER — Ambulatory Visit: Payer: BC Managed Care – PPO

## 2021-11-19 DIAGNOSIS — Z4589 Encounter for adjustment and management of other implanted devices: Secondary | ICD-10-CM | POA: Diagnosis not present

## 2021-11-19 DIAGNOSIS — Z95828 Presence of other vascular implants and grafts: Secondary | ICD-10-CM | POA: Diagnosis not present

## 2021-11-22 ENCOUNTER — Ambulatory Visit: Payer: BC Managed Care – PPO

## 2021-11-22 DIAGNOSIS — R269 Unspecified abnormalities of gait and mobility: Secondary | ICD-10-CM | POA: Diagnosis not present

## 2021-11-22 DIAGNOSIS — L8994 Pressure ulcer of unspecified site, stage 4: Secondary | ICD-10-CM | POA: Diagnosis not present

## 2021-11-22 DIAGNOSIS — R278 Other lack of coordination: Secondary | ICD-10-CM

## 2021-11-22 DIAGNOSIS — I6930 Unspecified sequelae of cerebral infarction: Secondary | ICD-10-CM

## 2021-11-22 DIAGNOSIS — R32 Unspecified urinary incontinence: Secondary | ICD-10-CM | POA: Diagnosis not present

## 2021-11-22 DIAGNOSIS — R2681 Unsteadiness on feet: Secondary | ICD-10-CM | POA: Diagnosis not present

## 2021-11-22 DIAGNOSIS — R262 Difficulty in walking, not elsewhere classified: Secondary | ICD-10-CM

## 2021-11-22 DIAGNOSIS — G4733 Obstructive sleep apnea (adult) (pediatric): Secondary | ICD-10-CM | POA: Diagnosis not present

## 2021-11-22 DIAGNOSIS — I6389 Other cerebral infarction: Secondary | ICD-10-CM | POA: Diagnosis not present

## 2021-11-22 DIAGNOSIS — M6281 Muscle weakness (generalized): Secondary | ICD-10-CM

## 2021-11-22 DIAGNOSIS — R2689 Other abnormalities of gait and mobility: Secondary | ICD-10-CM

## 2021-11-22 NOTE — Therapy (Signed)
OUTPATIENT OCCUPATIONAL THERAPY TREATMENT NOTE   Patient Name: Saige Busby MRN: [id number] DOB:11-01-1995, 26 y.o., male Today's Date: 11/22/2021  REFERRING PROVIDER:  Lenise Herald, MD .[email or web address]   OT End of Session - 11/22/21 1721     Visit Number 67    Number of Visits 96    Date for OT Re-Evaluation 12/07/21    Authorization Type Progress report period starting 10/06/2021    OT Start Time 1528    OT Stop Time 1607    OT Time Calculation (min) 39 min    Equipment Utilized During Treatment tilt in space power wc    Activity Tolerance Patient tolerated treatment well    Behavior During Therapy WFL for tasks assessed/performed              Past Medical History:  Diagnosis Date   Diabetes mellitus (HCC)    Hypertension    Stroke (HCC)    Past Surgical History:  Procedure Laterality Date   IVC FILTER PLACEMENT ([facility] HX)     LEG SURGERY     PEG TUBE PLACEMENT     TRACHEOSTOMY     Patient Active Problem List   Diagnosis Date Noted   Sepsis due to vancomycin resistant Enterococcus species (HCC) 06/06/2019   SIRS (systemic inflammatory response syndrome) (HCC) 06/05/2019   Acute lower UTI 06/05/2019   VRE (vancomycin-resistant Enterococci) infection 06/05/2019   Anemia 06/05/2019   Skin ulcer of sacrum with necrosis of muscle (HCC)    Urinary retention    Type 2 diabetes mellitus without complication, with long-term current use of insulin (HCC)    Tachycardia    Lower extremity edema    Acute metabolic encephalopathy    Obstructive sleep apnea    Morbid obesity with BMI of 60.0-69.9, adult (HCC)    Goals of care, counseling/discussion    Palliative care encounter    Sepsis (HCC) 04/27/2019   H/O insulin dependent diabetes mellitus 04/27/2019   History of CVA with residual deficit 04/27/2019   Seizure disorder (HCC) 04/27/2019   Decubitus ulcer of sacral region, stage 4 (HCC) 04/27/2019      REFERRING DIAG: CVA, COVID-19  THERAPY DIAG:   Muscle weakness (generalized)  Other lack of coordination  History of CVA with residual deficit  Rationale for Evaluation and Treatment Rehabilitation  PERTINENT HISTORY: Pt. is a 26 y.o. male who was hospitalized, and diagnosed with COVID-19, sustained a CVA with resultant quadreplegia in 01/2019. Pt. was hospitalized with VRE  UTI. Pt. PMHx includes: urinary retention, Seizure Disorder, Obstructive Sleep Disorder, DM Type II, and Morbid obesity. Pt. resides at home with his parents  PRECAUTIONS: No  SUBJECTIVE: Pt reported a lot of back strain performing table top activities over the last couple of sessions.  Pt was agreeable to alternating upright positioning with reclined positioning with more frequent rest breaks in order to build up tolerance for upright sitting.  PAIN:  Are you having pain? No pain at rest, 5-6/10 pain in bilat hands with passive stretching at end range.  Pain in back "moderate" with prolonged upright sitting.  Requires frequent positional changes.  OBJECTIVE:   TODAY'S TREATMENT:  Therapeutic Exercise: Moist heat applied to bilat hands x 5 min for muscle relaxation with simulatenous passive stretching for wrist and digit extension, alternating between R/L.  Completed grip strengthening with use of 1.5# digi-flex for 3 sets 10 reps in each hand.  Pt required assist with set up of digi-flex in each hand with OT to perform occasional  adjustment of grip.  Worked lateral pinch using built up foam grip between thumb and lateral aspect of IF for resistance, squeezing 3 sets 10 reps each hand.    Neuro re-ed: Facilitated bilat hand dexterity skills, working to pull, pinch, rotate 2 pieces of small pvc pipe, with goal of pulling pieces apart.  OT loosened pieces 2 times and provided verbal and tactile cues for grasp patterns.  Pt was unable to pull the 2 pieces apart but was able to change grasp a couple of times between R/L hands and dropped the pieces 1 time only (improved  from last attempt at this activity).  PATIENT EDUCATION: Education details: importance of upright sitting to build up core stability to enable progression to standing Person educated: pt Education method: vc Education comprehension: verbalized understanding    HOME EXERCISE PROGRAM   BUE, hand, and digit ROM, theraputty exercises; encouraged short periods of upright sitting in wc with positional changes as needed to build up sitting   tolerance for eventual progression to standing (with PT)   OT Short Term Goals - 09/13/21 1641       OT SHORT TERM GOAL #1   Title Pt and caregiver will obtain splint and follow wearing schedule consistently 7 days/week    Baseline 09/14/21: working to obtain splint    Time 6    Period Weeks    Status New    Target Date 10/26/21              OT Long Term Goals - 09/13/21 1623       OT LONG TERM GOAL #1   Title Pt. will improve FOTO score by 5 points for clinically significant improvements during ADLs, and IADLs,    Baseline 30th visit: FOTO: 37, 21st: FOTO 43. 10th Visit: FOTO score 42 Eval: FOTO score: 41 FOTO score: 39; 50th: FOTO 52; 09/13/21: FOTO 41    Time 12    Period Weeks    Status On-going    Target Date 12/07/21      OT LONG TERM GOAL #2   Title Pt. will improve right shoulder flexion by 15 degrees to assist caregivers with UE dressing    Baseline Pt. continues to require assist with UE dressing secondary to limited ROM; 08/16/21: Max A-total A UB dressing; R shoulder flexion/abd combo 0-100; 09/13/21: R shoulder flexion/abd combo 0-110, R shoulder hiking noted    Time 12    Period Weeks    Status Revised    Target Date 12/07/21      OT LONG TERM GOAL #3   Title Pt. will improve right Abduction by 10 degrees to assist caregivers with underarm hygiene care.    Baseline Eval: AROM abduction RUE 76. Pt. continues to present with limited bilateral abduction. 21st: AROM abduction RUE 80.06/16/2021: Right shulder abduction 108.  06/23/2021: Right shoulder abduction: 110; 08/16/21: abd 110    Time 12    Period Weeks    Status Achieved    Target Date 09/08/21      OT LONG TERM GOAL #4   Title Pt. will demonstrate visual compensatory strategies for navigating through his environment.    Baseline 30th visit: Pt. continues to require assist to navigate through his environment. 20th: Pt continues to require assist to navigate environment, minimal knowledge of strategies. Eval: Limited 06/23/2021: Pt. continues to be limited, and requires assist  with navigating through his environment; 08/16/21: father reports pt only steers his own wc in 1 open room of their  house, otherwise dep to navigate environment. 09/13/21: limited steering outside of house, steers in 1 room of house. 09/13/21: pt seeing low vision OT specialist to address.    Time 12    Period Weeks    Status Deferred    Target Date 09/08/21      OT LONG TERM GOAL #5   Title Pt. will demonstrate visual compensatory strategies for tabletop tasks within his near space.    Baseline 06/16/2021: Pt. continues to present with visual limitations for tabletop tasks in his near space. Pt. continues to present with limited vision. 21st: Pt can identify objects on tabletop ~waterbottle sized, improves with contrasting colours, cannot ID small onjects; 08/16/21: no change from last assessment period. 09/13/21: pt seeing low vision OT specialist to address.    Time 12    Period Weeks    Status Deferred    Target Date 09/08/21      OT LONG TERM GOAL #6   Title Pt. will perform self feeding with SETUP using adaptive equipment    Baseline 06/23/2021: Pt. is able to use his bilateral hands to perfrom hand to mouth patterns for self-feeding with finger foods. Pt. is now able to use his left hand to bring a water bottle to his mouth,a nd drink from it. 30th visit: Pt. continues to present with weakness limiting functional hand to mouth patterns of movement. Pt. is able to perform hand to mouth  patterns, however reports weakness in his hand  limits a secure grasp on the utensil. Pt. requires assist to perform; Eval: Pt. has difficulty; 08/16/21: pt continues to use L hand for self feeding finger foods with set up.  Spasticity limits dexterity to manipulate eating utensils. 09/13/21: can grip built up handle however requires MIN A 2/2 vision and wrist ROM deficits.    Time 12    Period Weeks    Status Revised    Target Date 12/07/21      OT LONG TERM GOAL #7   Title Pt. will perform one step self-grooming tasks with SETUP    Baseline 06/23/2021: Pt. is able to perform the hand to face pattern with the left hand, however has difficulty actively sustaining his BUEs in elevation long enough to perform self-grooming tasks. 06/16/2021: Pt. is able to perform the hand to face pattern with the left hand, however has difficulty actively sustaining his BUEs in elevation long enough to perform self-grooming tasks; 08/16/21: same as 06/16/21. 09/13/21: limited by trunk and sustaining BUEs    Time 12    Period Weeks    Status Revised    Target Date 12/07/21      OT LONG TERM GOAL #8   Title Pt will complete UB dressing tasks with SETUP and assist to thread over hands only    Baseline 06/23/2021: Pt. requires MinA to thread arms, and requires maxA to donn over upper arms, MaxAx2 to donn over his back. 06/16/2021: Pt. has progressed  to require MinA to thread arms, and requires maxA to donn over upper arms, MaxAx2 to donn over his back; 08/16/21: min A to thread arms, max A to don over upper arms and over head. 09/13/21: MOD A    Time 12    Period Weeks    Status On-going    Target Date 12/07/21      OT LONG TERM GOAL  #9   TITLE Pt will improve B grip strength by 10# to assist in handling ADL items    Baseline 08/16/21:  bilat girp 0#; 09/13/21: L grip 6#, R grip 9#    Time 12    Period Weeks    Status Revised    Target Date 12/06/21      OT LONG TERM GOAL  #10   TITLE Pt will improve B lateral pinch  strength by 8# to assist in UBD.    Baseline N/A: awaiting pinch meter to be fixed. Pt. unable to formulate proper positioning for lateral pinch assessment of the replacement pinch meter; 08/16/21: R 2#, L 1.5#; ; 09/13/21: R 6#, L 2#    Time 12    Period Weeks    Status Revised    Target Date 12/07/21              Plan - 09/23/21 1416     Clinical Impression Statement Pt reported poor tolerance for table top reaching activities d/t strain on back.  Pt was agreeable to alternating upright positioning with reclined positioning with more frequent rest breaks in order to build up tolerance for upright sitting, as this will carry over to pt progressing to standing in PT sessions.  Encouraged pt to participate in short periods of upright sitting in wc at home with positional changes as needed to build up sitting tolerance for eventual progression to standing (with PT), as pt reports he tends to eat slightly reclined in his recliner at home.  Pt verbalized understanding of recommendations and importance of increasing upright sitting tolerance.  Facilitated bilat hand dexterity skills, working to pull, pinch, rotate 2 pieces of small pvc pipe, with goal of pulling pieces apart.  OT loosened pieces 2 times and provided verbal and tactile cues for grasp patterns.  Pt was unable to pull the 2 pieces apart but was able to change grasp a couple of times between R/L hands and dropped the pieces 1 time only (improved from last attempt at this activity).  Pt  continues to benefit from OT services to improve ROM, and facilitate active volitional movement in order to work towards engaging his BUEs in daily ADLs, and IADL tasks.     OT Occupational Profile and History Detailed Assessment- Review of Records and additional review of physical, cognitive, psychosocial history related to current functional performance    Occupational performance deficits (Please refer to evaluation for details): ADL's;IADL's    Body  Structure / Function / Physical Skills ADL;FMC;ROM;Dexterity;IADL;Endurance;Obesity;Strength;UE functional use    Rehab Potential Good    Clinical Decision Making Multiple treatment options, significant modification of task necessary    Comorbidities Affecting Occupational Performance: Presence of comorbidities impacting occupational performance    Modification or Assistance to Complete Evaluation  Max significant modification of tasks or assist is necessary to complete    OT Frequency 2x / week    OT Duration 12 weeks    OT Treatment/Interventions Self-care/ADL training;Therapeutic exercise;Neuromuscular education;Visual/perceptual remediation/compensation;Patient/family education;Therapeutic activities;DME and/or AE instruction;Passive range of motion;Moist Heat;Electrical Stimulation    Consulted and Agree with Plan of Care Patient            Danelle Earthly, MS, OTR/L   Otis Dials, OT 11/22/2021, 5:32 PM

## 2021-11-22 NOTE — Therapy (Signed)
OUTPATIENT PHYSICAL THERAPY TREATMENT NOTE   Patient Name: Paul Hall MRN: [id number] DOB:11-22-1995, 26 y.o., male Today's Date: 11/22/2021  PCP:  Paul Hall PROVIDER:  Collene Mares, PA-C   PT End of Session - 11/22/21 1557     Visit Number 56    Number of Visits 61    Date for PT Re-Evaluation 12/08/21    Authorization Type [employer] and [employer]- Need auth past 12th visit    Authorization Time Period 01/04/21-03/29/21; Recert [id number]/76/[id number]; Recert 0/35/4656- 11/02/2749    Progress Note Due on Visit 68    PT Start Time 1607    PT Stop Time 7001    PT Time Calculation (min) 36 min    Equipment Utilized During Treatment Gait belt   Hoyer lift   Activity Tolerance Patient tolerated treatment well   queasiness/nausea   Behavior During Therapy WFL for tasks assessed/performed              Past Medical History:  Diagnosis Date   Diabetes mellitus ([place])    Hypertension    Stroke [facility])    Past Surgical History:  Procedure Laterality Date   IVC FILTER PLACEMENT ([place] HX)     LEG SURGERY     PEG TUBE PLACEMENT     TRACHEOSTOMY     Patient Active Problem List   Diagnosis Date Noted   Sepsis due to vancomycin resistant Enterococcus species ([place]) 06/06/2019   SIRS (systemic inflammatory response syndrome) ([place]) 06/05/2019   Acute lower UTI 06/05/2019   VRE (vancomycin-resistant Enterococci) infection 06/05/2019   Anemia 06/05/2019   Skin ulcer of sacrum with necrosis of muscle ([place])    Urinary retention    Type 2 diabetes mellitus without complication, with long-term current use of insulin (HCC)    Tachycardia    Lower extremity edema    Acute metabolic encephalopathy    Obstructive sleep apnea    Morbid obesity with BMI of 60.0-69.9, adult ([place])    Goals of care, counseling/discussion    Palliative care encounter    Sepsis ([place]) 04/27/2019   H/O insulin dependent diabetes mellitus 04/27/2019   History of CVA with residual deficit 04/27/2019    Seizure disorder ([place]) 04/27/2019   Decubitus ulcer of sacral region, stage 4 ([place]) 04/27/2019    REFERRING DIAG: Cerebral infarction, unspecified   THERAPY DIAG:  Muscle weakness (generalized)  Other lack of coordination  History of CVA with residual deficit  Difficulty in walking, not elsewhere classified  Unsteadiness on feet  Abnormality of gait and mobility  Other abnormalities of gait and mobility  Rationale for Evaluation and Treatment Rehabilitation  PERTINENT HISTORY: Paul Hall is a 25yoM who presents with severe weakness, quadriparesis, altered sensorium, and visual impairment s/p critical illness and prolonged hospitalization. Pt hospitalized in October 2020 with ARDS 2/2 COVID19 infection. Pt sustained a complex and lengthy hospitalization which included tracheostomy, prolonged sedation, ECMO. In this period pt sustained CVA and SDH. Pt has now been liberated from tracheostomy and G-tube. Pt has since been hospitalized for wound infection and UTI. Pt lives with parents at home, has hospital bed and left chair, hoyer lift transfers, and power WC for mobility needs. Pt needs heavy physical assistance with ADL 2/2 BUE contractures and motor dysfunction   PRECAUTIONS: Fall  SUBJECTIVE: [name] reports successful removal of IVC filter on Friday- On 10lb lifting restrictions for next week. States no pain presently.   PAIN:  Are you having pain? No     TODAY'S TREATMENT:  Therapeutic Exercises:  PROM- Hamstring stretch BLE x 30 sec x 3 each LE PROM- Hip ER/IR BLE x 30 sec x 3 each LE Hip circles x 20 reps CW then 20 more CCW Each LE Hip abduction stretch - PT pushing knees together x 30 sec x 3 each LE Calf press against footplate x 15 reps each. Seated hip march 2 x 15 reps each LE (AAROM)  Seated Knee ext 2 x15 reps each LE (AROM)   Seated hip abd  x 15 reps each LE Seated Hip add  x15 reps each LE.     Education provided throughout session via  VC/TC and demonstration to facilitate movement at target joints and correct muscle activation for all testing and exercises performed.       PATIENT EDUCATION: Education details: posture cues with sitting Person educated: Patient Education method: Explanation, Demonstration, Tactile cues, and Verbal cues Education comprehension: verbalized understanding, returned demonstration, verbal cues required, tactile cues required, and needs further education   HOME EXERCISE PROGRAM:    PT Short Term Goals -       PT SHORT TERM GOAL #1   Title Pt will be independent with HEP in order to improve strength and balance in order to decrease fall risk and improve function at home and work.    Baseline 01/04/2021= No formal HEP in place; 12/12 no HEP in place; 05/10/2021-Patient and his father were able to report compliance with curent HEP consisting of mostly seated/reclined LE strengthening. Both verbalize no questions at this time.    Time 6    Period Weeks    Status Achieved    Target Date 02/15/21              PT Long Term Goals -      PT LONG TERM GOAL #1   Title Patient will increase BLE gross strength by 1/2 muscle grade to improve functional strength for improved independence with potential gait, increased standing tolerance and increased ADL ability.    Baseline 01/04/2021- Patient presents with 1/5 to 3-/5 B LE strength with MMT; 12/12: goal partially met for Left knee/hip; 05/10/2021= 2-/5 bilateal Hip flex; 3+/5 bilateral Knee ext; 06/21/2021= Patient presents with 2-/5 bilateral Hip flex; 3+/5 bilateral knee ext/flex; 2-/5 left ankle DF; 0/5 right ankle- and able to increase reps and resistance with LE's. 09/15/2021- Patient technically presents with 2-/5 B hip flex/abd/add - but he is able to raise his hip up to approx 100 deg which has improved. 3+/5 Bilateral knee ext, 2-/5 left ankle and 0/5 right ankle.    Time 12    Period Weeks    Status On-going    Target Date 12/08/21      PT  LONG TERM GOAL #2   Title Patient will tolerate sitting unsupported demonstrating erect sitting posture for 15 minutes with CGA to demonstrate improved back extensor strength and improved sitting tolerance.    Baseline 01/04/2021- Patient confied to sitting in lift chair or electric power chair with back support and unable to sit upright without physical assistance; 12/12: tolerates <1 minutes upright unsupported sitting. 05/10/2021=static sit with forward trunk lean  in his power wheelchair without back support x approx 3 min. 06/21/2021=Unable to assess today due to patient with acute back pain but on previous visit able to sit x 8 min without back support. 09/15/2021- on last visit- 09/13/2021- patient was able to sit unsupported x 8 min at edge of mat. 10/13/2023 - Patient was able to sit at  edge of mat with varying level of assist today from SBA to min A for a total of 20 min.    Time 12    Period Weeks    Status On-going    Target Date 12/08/21      PT LONG TERM GOAL #3   Title Patient will demonstrate ability to perform static standing in // bars > 2 min with Max Assist  without loss of balance and fair posture for improved overall strength for pre-gait and transfer activities.    Baseline 01/04/2021= Patient current uanble to stand- Dependent on hoyer or sit to stand lift for transfers. 05/10/2021=Not appropriate yet- Currently still dependent with all transfers using hoyer. 06/21/2021= Patient continuing now to focus on LE strengthening to prepare for standing-unable to try today due to acute low back pain-  planning on attempting in new cert period. 09/15/2021- Patient has attempted standing 2x in past two week- max Assist of 2 people - only once was he successful to clearing his bottom from chair - Will continue to be a focus during the new certification.    Time 12    Period Weeks    Status On-going    Target Date 12/08/21      PT LONG TERM GOAL #4   Title Pt will improve FOTO score by 10 points or  more demonstrating improved perceived functional ability    Baseline FOTO 7 on 10/17; 03/15/21: FOTO 12; 05/10/2021 06/21/2021= 1; 09/15/2021= 9    Time 12    Period Weeks    Status On-going    Target Date 12/08/21      PT LONG TERM GOAL #5   Title Patient will perform sit to stand transfer with appropriate AD and max assist of 2 people with 75% consistency to prepare for pregait activities.    Baseline 09/15/2021= Patient unable to stand well- unable to clear his bottom off chair with Max assist of 2 persons.    Time 12    Period Weeks    Status New    Target Date 12/08/21              Plan     Clinical Impression Statement Treatment again limited to strengthening (without resistance) to focus on LE strengthening. He was able to complete 2 rounds of general LE without straining today- mostly active with some AAROM on right side.  Patient would benefit from additional skilled PT intervention to improve strength, balance, and mobility for improved quality of life.    Personal Factors and Comorbidities Comorbidity 3+;Time since onset of injury/illness/exacerbation    Comorbidities CVA, diabetes, Seizures    Examination-Activity Limitations Bathing;Bed Mobility;Bend;Caring for Others;Carry;Dressing;Hygiene/Grooming;Lift;Locomotion Level;Reach Overhead;Self Feeding;Sit;Squat;Stairs;Stand;Transfers;Toileting    Examination-Participation Restrictions Cleaning;Community Activity;Driving;Laundry;Medication Management;Meal Prep;Occupation;Personal Finances;Shop;Yard Work;Volunteer    Stability/Clinical Decision Making Evolving/Moderate complexity    Rehab Potential Fair    PT Frequency 2x / week    PT Duration 12 weeks    PT Treatment/Interventions ADLs/Self Care Home Management;Cryotherapy;Electrical Stimulation;Moist Heat;Ultrasound;DME Instruction;Gait training;Stair training;Functional mobility training;Therapeutic exercise;Balance training;Patient/family education;Orthotic  Fit/Training;Neuromuscular re-education;Wheelchair mobility training;Manual techniques;Passive range of motion;Dry needling;Energy conservation;Taping;Visual/perceptual remediation/compensation;Joint Manipulations    PT Next Visit Plan core strength- sitting at EOM, postural control, sabina sit to stand if able; Continue with progressive LE Strengthening    PT Home Exercise Plan No changes to HEP today    Consulted and Agree with Plan of Care Patient;Family member/caregiver    Family Member Consulted Dad  Lewis Moccasin, PT 11/22/2021, 5:09 PM

## 2021-11-23 ENCOUNTER — Telehealth: Payer: BC Managed Care – PPO | Admitting: Student

## 2021-11-23 DIAGNOSIS — G8250 Quadriplegia, unspecified: Secondary | ICD-10-CM | POA: Diagnosis not present

## 2021-11-23 DIAGNOSIS — Z8673 Personal history of transient ischemic attack (TIA), and cerebral infarction without residual deficits: Secondary | ICD-10-CM | POA: Diagnosis not present

## 2021-11-24 ENCOUNTER — Ambulatory Visit: Payer: BC Managed Care – PPO | Admitting: Occupational Therapy

## 2021-11-24 ENCOUNTER — Encounter: Payer: Self-pay | Admitting: Occupational Therapy

## 2021-11-24 ENCOUNTER — Ambulatory Visit: Payer: BC Managed Care – PPO

## 2021-11-24 DIAGNOSIS — R262 Difficulty in walking, not elsewhere classified: Secondary | ICD-10-CM | POA: Diagnosis not present

## 2021-11-24 DIAGNOSIS — G4733 Obstructive sleep apnea (adult) (pediatric): Secondary | ICD-10-CM | POA: Diagnosis not present

## 2021-11-24 DIAGNOSIS — R2689 Other abnormalities of gait and mobility: Secondary | ICD-10-CM | POA: Diagnosis not present

## 2021-11-24 DIAGNOSIS — I6930 Unspecified sequelae of cerebral infarction: Secondary | ICD-10-CM | POA: Diagnosis not present

## 2021-11-24 DIAGNOSIS — M6281 Muscle weakness (generalized): Secondary | ICD-10-CM | POA: Diagnosis not present

## 2021-11-24 DIAGNOSIS — R269 Unspecified abnormalities of gait and mobility: Secondary | ICD-10-CM

## 2021-11-24 DIAGNOSIS — R278 Other lack of coordination: Secondary | ICD-10-CM

## 2021-11-24 DIAGNOSIS — R2681 Unsteadiness on feet: Secondary | ICD-10-CM | POA: Diagnosis not present

## 2021-11-24 DIAGNOSIS — L8994 Pressure ulcer of unspecified site, stage 4: Secondary | ICD-10-CM | POA: Diagnosis not present

## 2021-11-24 DIAGNOSIS — I6389 Other cerebral infarction: Secondary | ICD-10-CM | POA: Diagnosis not present

## 2021-11-24 NOTE — Therapy (Addendum)
OUTPATIENT OCCUPATIONAL THERAPY TREATMENT NOTE   Patient Name: Paul Hall MRN: [id number] DOB:Aug 24, 1995, 26 y.o., male Today's Date: 11/24/2021  REFERRING PROVIDER:  Collene Mares, MD .otendofsession  OT End of Session - 11/24/21 1807     Visit Number [id number]    Number of Visits 56    Date for OT Re-Evaluation 12/07/21    Authorization Type Progress report period starting 10/06/2021    OT Start Time 1515    OT Stop Time 1600    OT Time Calculation (min) 45 min    Activity Tolerance Patient tolerated treatment well    Behavior During Therapy WFL for tasks assessed/performed                 Past Medical History:  Diagnosis Date   Diabetes mellitus ([place])    Hypertension    Stroke [facility])    Past Surgical History:  Procedure Laterality Date   IVC FILTER PLACEMENT ([place] HX)     LEG SURGERY     PEG TUBE PLACEMENT     TRACHEOSTOMY     Patient Active Problem List   Diagnosis Date Noted   Sepsis due to vancomycin resistant Enterococcus species ([place]) 06/06/2019   SIRS (systemic inflammatory response syndrome) ([place]) 06/05/2019   Acute lower UTI 06/05/2019   VRE (vancomycin-resistant Enterococci) infection 06/05/2019   Anemia 06/05/2019   Skin ulcer of sacrum with necrosis of muscle ([place])    Urinary retention    Type 2 diabetes mellitus without complication, with long-term current use of insulin (HCC)    Tachycardia    Lower extremity edema    Acute metabolic encephalopathy    Obstructive sleep apnea    Morbid obesity with BMI of 60.0-69.9, adult ([place])    Goals of care, counseling/discussion    Palliative care encounter    Sepsis ([place]) 04/27/2019   H/O insulin dependent diabetes mellitus 04/27/2019   History of CVA with residual deficit 04/27/2019   Seizure disorder ([place]) 04/27/2019   Decubitus ulcer of sacral region, stage 4 ([place]) 04/27/2019      REFERRING DIAG: CVA, COVID-19  THERAPY DIAG:  Muscle weakness (generalized)  Rationale for Evaluation  and Treatment Rehabilitation  PERTINENT HISTORY: Pt. is a 26 y.o. male who was hospitalized, and diagnosed with COVID-19, sustained a CVA with resultant quadreplegia in 01/2019. Pt. was hospitalized with VRE  UTI. Pt. PMHx includes: urinary retention, Seizure Disorder, Obstructive Sleep Disorder, DM Type II, and Morbid obesity. Pt. resides at home with his parents  PRECAUTIONS: No  SUBJECTIVE:  Pt. Reports feeling well today. Marland Kitchen  PAIN:  Are you having pain? No pain   OBJECTIVE:   TODAY'S TREATMENT:     Therapeutic Exercise:   Pt. tolerated bilateral forearm supination, wrist flexion, extension, and MP, PIP, and DIP flexion, and extension without reports of pain this afternoon. Patient tolerated moist heat modality his bilateral hands prior to range of motion. There. Ex was performed to improve ROM, and decrease stiffness.    Pt. received Botox injections yesterday to decrease tone in his bilateral digit flexors. Pt.'s current right resting hand splint was manually modified, and the fit was assessed. Pt. Was unable to achieve the proper fit position of his right wrist, and digits in the current splint. Pt. Tolerated ROM following moist heat well today. Pt. Required one reclining rest break during the session.  Pt. continues to benefit from OT services to improve ROM, and facilitate active volitional movement in order to work towards engaging his BUEs in  daily ADLs, and IADL tasks.       PATIENT EDUCATION: Education details: importance of upright sitting to build up core stability to enable progression to standing Person educated: pt Education method: vc Education comprehension: verbalized understanding    HOME EXERCISE PROGRAM   BUE, hand, and digit ROM, theraputty exercises; encouraged short periods of upright sitting in wc with positional changes as needed to build up sitting   tolerance for eventual progression to standing (with PT)   OT Short Term Goals - 09/13/21 1641       OT  SHORT TERM GOAL #1   Title Pt and caregiver will obtain splint and follow wearing schedule consistently 7 days/week    Baseline 09/14/21: working to obtain splint    Time 6    Period Weeks    Status New    Target Date 10/26/21              OT Long Term Goals - 09/13/21 1623       OT LONG TERM GOAL #1   Title Pt. will improve FOTO score by 5 points for clinically significant improvements during ADLs, and IADLs,    Baseline 30th visit: FOTO: 37, 21st: FOTO 43. 10th Visit: FOTO score 42 Eval: FOTO score: 41 FOTO score: 39; 50th: FOTO 52; 09/13/21: FOTO 41    Time 12    Period Weeks    Status On-going    Target Date 12/07/21      OT LONG TERM GOAL #2   Title Pt. will improve right shoulder flexion by 15 degrees to assist caregivers with UE dressing    Baseline Pt. continues to require assist with UE dressing secondary to limited ROM; 08/16/21: Max A-total A UB dressing; R shoulder flexion/abd combo 0-100; 09/13/21: R shoulder flexion/abd combo 0-110, R shoulder hiking noted    Time 12    Period Weeks    Status Revised    Target Date 12/07/21      OT LONG TERM GOAL #3   Title Pt. will improve right Abduction by 10 degrees to assist caregivers with underarm hygiene care.    Baseline Eval: AROM abduction RUE 76. Pt. continues to present with limited bilateral abduction. 21st: AROM abduction RUE 80.06/16/2021: Right shulder abduction 108. 06/23/2021: Right shoulder abduction: 110; 08/16/21: abd 110    Time 12    Period Weeks    Status Achieved    Target Date 09/08/21      OT LONG TERM GOAL #4   Title Pt. will demonstrate visual compensatory strategies for navigating through his environment.    Baseline 30th visit: Pt. continues to require assist to navigate through his environment. 20th: Pt continues to require assist to navigate environment, minimal knowledge of strategies. Eval: Limited 06/23/2021: Pt. continues to be limited, and requires assist  with navigating through his environment;  08/16/21: father reports pt only steers his own wc in 1 open room of their house, otherwise dep to navigate environment. 09/13/21: limited steering outside of house, steers in 1 room of house. 09/13/21: pt seeing low vision OT specialist to address.    Time 12    Period Weeks    Status Deferred    Target Date 09/08/21      OT LONG TERM GOAL #5   Title Pt. will demonstrate visual compensatory strategies for tabletop tasks within his near space.    Baseline 06/16/2021: Pt. continues to present with visual limitations for tabletop tasks in his near space. Pt. continues to present  with limited vision. 21st: Pt can identify objects on tabletop ~waterbottle sized, improves with contrasting colours, cannot ID small onjects; 08/16/21: no change from last assessment period. 09/13/21: pt seeing low vision OT specialist to address.    Time 12    Period Weeks    Status Deferred    Target Date 09/08/21      OT LONG TERM GOAL #6   Title Pt. will perform self feeding with SETUP using adaptive equipment    Baseline 06/23/2021: Pt. is able to use his bilateral hands to perfrom hand to mouth patterns for self-feeding with finger foods. Pt. is now able to use his left hand to bring a water bottle to his mouth,a nd drink from it. 30th visit: Pt. continues to present with weakness limiting functional hand to mouth patterns of movement. Pt. is able to perform hand to mouth patterns, however reports weakness in his hand  limits a secure grasp on the utensil. Pt. requires assist to perform; Eval: Pt. has difficulty; 08/16/21: pt continues to use L hand for self feeding finger foods with set up.  Spasticity limits dexterity to manipulate eating utensils. 09/13/21: can grip built up handle however requires MIN A 2/2 vision and wrist ROM deficits.    Time 12    Period Weeks    Status Revised    Target Date 12/07/21      OT LONG TERM GOAL #7   Title Pt. will perform one step self-grooming tasks with SETUP    Baseline 06/23/2021:  Pt. is able to perform the hand to face pattern with the left hand, however has difficulty actively sustaining his BUEs in elevation long enough to perform self-grooming tasks. 06/16/2021: Pt. is able to perform the hand to face pattern with the left hand, however has difficulty actively sustaining his BUEs in elevation long enough to perform self-grooming tasks; 08/16/21: same as 06/16/21. 09/13/21: limited by trunk and sustaining BUEs    Time 12    Period Weeks    Status Revised    Target Date 12/07/21      OT LONG TERM GOAL #8   Title Pt will complete UB dressing tasks with SETUP and assist to thread over hands only    Baseline 06/23/2021: Pt. requires MinA to thread arms, and requires maxA to donn over upper arms, MaxAx2 to donn over his back. 06/16/2021: Pt. has progressed  to require MinA to thread arms, and requires maxA to donn over upper arms, MaxAx2 to donn over his back; 08/16/21: min A to thread arms, max A to don over upper arms and over head. 09/13/21: MOD A    Time 12    Period Weeks    Status On-going    Target Date 12/07/21      OT LONG TERM GOAL  #9   TITLE Pt will improve B grip strength by 10# to assist in handling ADL items    Baseline 08/16/21: bilat girp 0#; 09/13/21: L grip 6#, R grip 9#    Time 12    Period Weeks    Status Revised    Target Date 12/06/21      OT LONG TERM GOAL  #10   TITLE Pt will improve B lateral pinch strength by 8# to assist in UBD.    Baseline N/A: awaiting pinch meter to be fixed. Pt. unable to formulate proper positioning for lateral pinch assessment of the replacement pinch meter; 08/16/21: R 2#, L 1.5#; ; 09/13/21: R 6#, L 2#  Time 12    Period Weeks    Status Revised    Target Date 12/07/21              Plan - 09/23/21 1416     Clinical Impression Statement Pt. received Botox injections yesterday to decrease tone in his bilateral digit flexors. Pt.'s current right resting hand splint was manually modified, and the fit was assessed. Pt.  Was unable to achieve the proper fit position of his right wrist, and digits in the current splint. Pt. Tolerated ROM following moist heat well today. Pt. Required one reclining rest break during the session.  Pt. continues to benefit from OT services to improve ROM, and facilitate active volitional movement in order to work towards engaging his BUEs in daily ADLs, and IADL tasks.         OT Occupational Profile and History Detailed Assessment- Review of Records and additional review of physical, cognitive, psychosocial history related to current functional performance    Occupational performance deficits (Please refer to evaluation for details): ADL's;IADL's    Body Structure / Function / Physical Skills ADL;FMC;ROM;Dexterity;IADL;Endurance;Obesity;Strength;UE functional use    Rehab Potential Good    Clinical Decision Making Multiple treatment options, significant modification of task necessary    Comorbidities Affecting Occupational Performance: Presence of comorbidities impacting occupational performance    Modification or Assistance to Complete Evaluation  Max significant modification of tasks or assist is necessary to complete    OT Frequency 2x / week    OT Duration 12 weeks    OT Treatment/Interventions Self-care/ADL training;Therapeutic exercise;Neuromuscular education;Visual/perceptual remediation/compensation;Patient/family education;Therapeutic activities;DME and/or AE instruction;Passive range of motion;Moist Heat;Electrical Stimulation    Consulted and Agree with Plan of Care Patient            Olegario Messier, MS, OTR/L    Olegario Messier, OT 11/24/2021, 6:13 PM

## 2021-11-25 NOTE — Therapy (Signed)
OUTPATIENT PHYSICAL THERAPY TREATMENT NOTE   Patient Name: Paul Hall MRN: [id number] DOB:10-25-1995, 26 y.o., male Today's Date: 11/25/2021  PCP:  Loistine Chance PROVIDER:  Collene Mares, PA-C   PT End of Session - 11/24/21 1225     Visit Number 55    Number of Visits 61    Date for PT Re-Evaluation 12/08/21    Authorization Type [employer] and [employer]- Need auth past 12th visit    Authorization Time Period 01/04/21-03/29/21; Recert [id number]/61/4431; Recert 5/40/0867- 09/03/9507    Progress Note Due on Visit 9    PT Start Time 1601    PT Stop Time 1640    PT Time Calculation (min) 39 min    Equipment Utilized During Treatment Gait belt   Hoyer lift   Activity Tolerance Patient tolerated treatment well   queasiness/nausea   Behavior During Therapy WFL for tasks assessed/performed              Past Medical History:  Diagnosis Date   Diabetes mellitus ([place])    Hypertension    Stroke [facility])    Past Surgical History:  Procedure Laterality Date   IVC FILTER PLACEMENT ([place] HX)     LEG SURGERY     PEG TUBE PLACEMENT     TRACHEOSTOMY     Patient Active Problem List   Diagnosis Date Noted   Sepsis due to vancomycin resistant Enterococcus species ([place]) 06/06/2019   SIRS (systemic inflammatory response syndrome) ([place]) 06/05/2019   Acute lower UTI 06/05/2019   VRE (vancomycin-resistant Enterococci) infection 06/05/2019   Anemia 06/05/2019   Skin ulcer of sacrum with necrosis of muscle ([place])    Urinary retention    Type 2 diabetes mellitus without complication, with long-term current use of insulin (HCC)    Tachycardia    Lower extremity edema    Acute metabolic encephalopathy    Obstructive sleep apnea    Morbid obesity with BMI of 60.0-69.9, adult ([place])    Goals of care, counseling/discussion    Palliative care encounter    Sepsis ([place]) 04/27/2019   H/O insulin dependent diabetes mellitus 04/27/2019   History of CVA with residual deficit 04/27/2019    Seizure disorder ([place]) 04/27/2019   Decubitus ulcer of sacral region, stage 4 ([place]) 04/27/2019    REFERRING DIAG: Cerebral infarction, unspecified   THERAPY DIAG:  Muscle weakness (generalized)  Other lack of coordination  History of CVA with residual deficit  Difficulty in walking, not elsewhere classified  Unsteadiness on feet  Abnormality of gait and mobility  Rationale for Evaluation and Treatment Rehabilitation  PERTINENT HISTORY: Paul Hall is a 25yoM who presents with severe weakness, quadriparesis, altered sensorium, and visual impairment s/p critical illness and prolonged hospitalization. Pt hospitalized in October 2020 with ARDS 2/2 COVID19 infection. Pt sustained a complex and lengthy hospitalization which included tracheostomy, prolonged sedation, ECMO. In this period pt sustained CVA and SDH. Pt has now been liberated from tracheostomy and G-tube. Pt has since been hospitalized for wound infection and UTI. Pt lives with parents at home, has hospital bed and left chair, hoyer lift transfers, and power WC for mobility needs. Pt needs heavy physical assistance with ADL 2/2 BUE contractures and motor dysfunction   PRECAUTIONS: Fall  SUBJECTIVE: Patient reports LE sore from last visit and no other issues today.   PAIN:  Are you having pain? No     TODAY'S TREATMENT:     Therapeutic Exercises:  PROM- Hamstring stretch BLE x 30 sec x 3 each  LE PROM- Hip ER/IR BLE x 30 sec x 3 each LE Hip circles x 20 reps CW then 20 more CCW Each LE Hip abduction stretch - PT pushing knees together x 30 sec x 3 each LE Calf press against footplate 2 x 15 reps each. Seated hip march x 15 reps each LE (AAROM)  Seated Knee ext x15 reps each LE (AROM)   Seated hip abd  x 15 reps each LE Seated Hip add  x15 reps each LE.     Education provided throughout session via VC/TC and demonstration to facilitate movement at target joints and correct muscle activation for all testing  and exercises performed.       PATIENT EDUCATION: Education details: posture cues with sitting Person educated: Patient Education method: Explanation, Demonstration, Tactile cues, and Verbal cues Education comprehension: verbalized understanding, returned demonstration, verbal cues required, tactile cues required, and needs further education   HOME EXERCISE PROGRAM:    PT Short Term Goals -       PT SHORT TERM GOAL #1   Title Pt will be independent with HEP in order to improve strength and balance in order to decrease fall risk and improve function at home and work.    Baseline 01/04/2021= No formal HEP in place; 12/12 no HEP in place; 05/10/2021-Patient and his father were able to report compliance with curent HEP consisting of mostly seated/reclined LE strengthening. Both verbalize no questions at this time.    Time 6    Period Weeks    Status Achieved    Target Date 02/15/21              PT Long Term Goals -      PT LONG TERM GOAL #1   Title Patient will increase BLE gross strength by 1/2 muscle grade to improve functional strength for improved independence with potential gait, increased standing tolerance and increased ADL ability.    Baseline 01/04/2021- Patient presents with 1/5 to 3-/5 B LE strength with MMT; 12/12: goal partially met for Left knee/hip; 05/10/2021= 2-/5 bilateal Hip flex; 3+/5 bilateral Knee ext; 06/21/2021= Patient presents with 2-/5 bilateral Hip flex; 3+/5 bilateral knee ext/flex; 2-/5 left ankle DF; 0/5 right ankle- and able to increase reps and resistance with LE's. 09/15/2021- Patient technically presents with 2-/5 B hip flex/abd/add - but he is able to raise his hip up to approx 100 deg which has improved. 3+/5 Bilateral knee ext, 2-/5 left ankle and 0/5 right ankle.    Time 12    Period Weeks    Status On-going    Target Date 12/08/21      PT LONG TERM GOAL #2   Title Patient will tolerate sitting unsupported demonstrating erect sitting posture for  15 minutes with CGA to demonstrate improved back extensor strength and improved sitting tolerance.    Baseline 01/04/2021- Patient confied to sitting in lift chair or electric power chair with back support and unable to sit upright without physical assistance; 12/12: tolerates <1 minutes upright unsupported sitting. 05/10/2021=static sit with forward trunk lean  in his power wheelchair without back support x approx 3 min. 06/21/2021=Unable to assess today due to patient with acute back pain but on previous visit able to sit x 8 min without back support. 09/15/2021- on last visit- 09/13/2021- patient was able to sit unsupported x 8 min at edge of mat. 10/13/2023 - Patient was able to sit at edge of mat with varying level of assist today from SBA to min A  for a total of 20 min.    Time 12    Period Weeks    Status On-going    Target Date 12/08/21      PT LONG TERM GOAL #3   Title Patient will demonstrate ability to perform static standing in // bars > 2 min with Max Assist  without loss of balance and fair posture for improved overall strength for pre-gait and transfer activities.    Baseline 01/04/2021= Patient current uanble to stand- Dependent on hoyer or sit to stand lift for transfers. 05/10/2021=Not appropriate yet- Currently still dependent with all transfers using hoyer. 06/21/2021= Patient continuing now to focus on LE strengthening to prepare for standing-unable to try today due to acute low back pain-  planning on attempting in new cert period. 09/15/2021- Patient has attempted standing 2x in past two week- max Assist of 2 people - only once was he successful to clearing his bottom from chair - Will continue to be a focus during the new certification.    Time 12    Period Weeks    Status On-going    Target Date 12/08/21      PT LONG TERM GOAL #4   Title Pt will improve FOTO score by 10 points or more demonstrating improved perceived functional ability    Baseline FOTO 7 on 10/17; 03/15/21: FOTO 12;  05/10/2021 06/21/2021= 1; 09/15/2021= 9    Time 12    Period Weeks    Status On-going    Target Date 12/08/21      PT LONG TERM GOAL #5   Title Patient will perform sit to stand transfer with appropriate AD and max assist of 2 people with 75% consistency to prepare for pregait activities.    Baseline 09/15/2021= Patient unable to stand well- unable to clear his bottom off chair with Max assist of 2 persons.    Time 12    Period Weeks    Status New    Target Date 12/08/21              Plan     Clinical Impression Statement Patient again limited to strengthening but legs were sore so modified reps today to accommodate patient while continuing to keep legs working. Patient would benefit from additional skilled PT intervention to improve strength, balance, and mobility for improved quality of life.    Personal Factors and Comorbidities Comorbidity 3+;Time since onset of injury/illness/exacerbation    Comorbidities CVA, diabetes, Seizures    Examination-Activity Limitations Bathing;Bed Mobility;Bend;Caring for Others;Carry;Dressing;Hygiene/Grooming;Lift;Locomotion Level;Reach Overhead;Self Feeding;Sit;Squat;Stairs;Stand;Transfers;Toileting    Examination-Participation Restrictions Cleaning;Community Activity;Driving;Laundry;Medication Management;Meal Prep;Occupation;Personal Finances;Shop;Yard Work;Volunteer    Stability/Clinical Decision Making Evolving/Moderate complexity    Rehab Potential Fair    PT Frequency 2x / week    PT Duration 12 weeks    PT Treatment/Interventions ADLs/Self Care Home Management;Cryotherapy;Electrical Stimulation;Moist Heat;Ultrasound;DME Instruction;Gait training;Stair training;Functional mobility training;Therapeutic exercise;Balance training;Patient/family education;Orthotic Fit/Training;Neuromuscular re-education;Wheelchair mobility training;Manual techniques;Passive range of motion;Dry needling;Energy conservation;Taping;Visual/perceptual  remediation/compensation;Joint Manipulations    PT Next Visit Plan core strength- sitting at EOM, postural control, sabina sit to stand if able; Continue with progressive LE Strengthening    PT Home Exercise Plan No changes to HEP today    Consulted and Agree with Plan of Care Patient;Family member/caregiver    Family Member Consulted Dad               Lewis Moccasin, PT 11/25/2021, 3:51 PM

## 2021-11-29 ENCOUNTER — Ambulatory Visit: Payer: BC Managed Care – PPO

## 2021-12-01 ENCOUNTER — Ambulatory Visit: Payer: BC Managed Care – PPO

## 2021-12-03 DIAGNOSIS — I639 Cerebral infarction, unspecified: Secondary | ICD-10-CM | POA: Diagnosis not present

## 2021-12-03 DIAGNOSIS — Z6841 Body Mass Index (BMI) 40.0 and over, adult: Secondary | ICD-10-CM | POA: Diagnosis not present

## 2021-12-03 DIAGNOSIS — Z8673 Personal history of transient ischemic attack (TIA), and cerebral infarction without residual deficits: Secondary | ICD-10-CM | POA: Diagnosis not present

## 2021-12-07 ENCOUNTER — Other Ambulatory Visit: Payer: BC Managed Care – PPO | Admitting: Student

## 2021-12-07 DIAGNOSIS — R52 Pain, unspecified: Secondary | ICD-10-CM | POA: Diagnosis not present

## 2021-12-07 DIAGNOSIS — L732 Hidradenitis suppurativa: Secondary | ICD-10-CM

## 2021-12-07 DIAGNOSIS — Z515 Encounter for palliative care: Secondary | ICD-10-CM

## 2021-12-07 DIAGNOSIS — I639 Cerebral infarction, unspecified: Secondary | ICD-10-CM

## 2021-12-07 NOTE — Progress Notes (Signed)
[facility] Palliative Care Consult Note Telephone: [phone]  Fax: [phone]   Date of encounter: 12/07/21 3:00 PM PATIENT NAME: Paul Hall [address] [place] [address]   [phone] (home)  DOB: June 07, 1995 MRN: [id number] PRIMARY CARE PROVIDER:    Ginger Hall,  [place] [address] [phone]  REFERRING PROVIDER:   Ginger Hall [address] [place],  [place] [address] [phone]  RESPONSIBLE PARTY:    Contact Information     Name Relation Home Work Mobile   Paul Hall Mother [phone]  [phone]        I met face to face with patient and family in the home. Palliative Care was asked to follow this patient by consultation request of  Paul Munch, PA-C to address advance care planning and complex medical decision making. This is the initial visit.                                     ASSESSMENT AND PLAN / RECOMMENDATIONS:   Advance Care Planning/Goals of Care: Goals include to maximize quality of life and symptom management. Patient/health care surrogate gave his/her permission to discuss.Our advance care planning conversation included a discussion about:    The value and importance of advance care planning  Experiences with loved ones who have been seriously ill or have died  Exploration of personal, cultural or spiritual beliefs that might influence medical decisions  Exploration of goals of care in the event of a sudden injury or illness  HCPOA- Mother, Paul Hall CODE STATUS: TBD  Education provided on Palliative Medicine vs. Hospice services. We discussed goals of care. Patient to continue outpatient therapy, with hopes to become more independent. Patient's mother has applied for CAP services in past; did not receive any follow up. Will refer to Palliative SW.  Symptom Management/Plan:  Ischemic stroke- patient sustained in 2020. Ischemic  stroke likely precipitated by ECMO and hypercoagulable secondary to his COVID-19 infection. Patient continues on outpatient therapy. He does have bilateral weakness, although right sided weakness is worse. He does have right visual field loss. He has regained his speech. He continues to receive out patient therapy. He is receiving regular diet; patient progressed from feeding tube and trach. He is to continue Botox injections every 3 months to help with spasticity. Continue Xarelto, aspirin as directed.   Pain-patient with back pain. Recommend premedicating before therapy. Continue acetaminophen and tramadol PRN as directed.   hidradenitis suppurativa-continue doxycyline daily.   Follow up Palliative Care Visit: Palliative care will continue to follow for complex medical decision making, advance care planning, and clarification of goals. Return in 6-8 weeks or prn.  I spent 60 minutes providing this consultation. More than 50% of the time in this consultation was spent in counseling and care coordination.   PPS: 40%  HOSPICE ELIGIBILITY/DIAGNOSIS: TBD  Chief Complaint: Palliative Medicine initial consult visit.   HISTORY OF PRESENT ILLNESS:  Paul Hall is a 26 y.o. year old male  with hx of ischemic stroke, severe COVID-19 infection, T2DM, hidradenitis suppurativa. Obesity. Long-term hospitalization October 2020-January 2021; hospitalization complicated by ischemic stroke, DVT's subdural hematoma.   Patient resides at home with his parents and brother. He is dependent for adl's. He is out of bed daily via lift to recliner. He has bilateral weakness, R > L. He has right visual field loss. He  has improved significantly from hospitalization. He was nonverbal from October 2020-March 2021. He previously had a trach, feeding tube. He is eating a regular diet; no swallowing difficulty, aspiration reported. He is receiving outpatient therapy once a week. OT/PT.  Reports back pain with therapy.  Taking acetaminophen and tramadol PRN. He receives doxycycline daily due hx of hidradenitis suppurativa. He does have occasional skin breakdown; his mother is applying Paul Hall gel with dressing to areas. Patient's mother had applied for CAP services last year, but has not received any follow up.    History obtained from review of EMR, discussion with primary team, and interview with family, facility staff/caregiver and/or Paul Hall.  I reviewed available labs, medications, imaging, studies and related documents from the EMR.  Records reviewed and summarized above.   ROS  A 10-Point ROS is negative, except for the pertinent   Physical Exam: Pulse 88, resp 16, sats 98% on room air Constitutional: NAD General: frail appearing, bese  EYES: anicteric sclera, lids intact, no discharge  ENMT: intact hearing, oral mucous membranes moist, dentition intact CV: S1S2, RRR, 1+ LE edema Pulmonary: LCTA, no increased work of breathing, no cough, room air Abdomen: normo-active BS + 4 quadrants, soft and non tender GU: deferred MSK: moves extremities, non-ambulatory Skin: warm and dry, no rashes or wounds on visible skin Neuro: generalized weakness, A & O x 3 Psych: non-anxious affect, pleasant Hem/lymph/immuno: no widespread bruising CURRENT PROBLEM LIST:  Patient Active Problem List   Diagnosis Date Noted   Sepsis due to vancomycin resistant Enterococcus species (HCC) 06/06/2019   SIRS (systemic inflammatory response syndrome) ([place]) 06/05/2019   Acute lower UTI 06/05/2019   VRE (vancomycin-resistant Enterococci) infection 06/05/2019   Anemia 06/05/2019   Skin ulcer of sacrum with necrosis of muscle ([place])    Urinary retention    Type 2 diabetes mellitus without complication, with long-term current use of insulin (HCC)    Tachycardia    Lower extremity edema    Acute metabolic encephalopathy    Obstructive sleep apnea    Morbid obesity with BMI of 60.0-69.9, adult ([place])    Goals of  care, counseling/discussion    Palliative care encounter    Sepsis ([place]) 04/27/2019   H/O insulin dependent diabetes mellitus 04/27/2019   History of CVA with residual deficit 04/27/2019   Seizure disorder ([place]) 04/27/2019   Decubitus ulcer of sacral region, stage 4 ([place]) 04/27/2019   PAST MEDICAL HISTORY:  Active Ambulatory Problems    Diagnosis Date Noted   Sepsis ([place]) 04/27/2019   H/O insulin dependent diabetes mellitus 04/27/2019   History of CVA with residual deficit 04/27/2019   Seizure disorder ([place]) 04/27/2019   Decubitus ulcer of sacral region, stage 4 ([place]) 04/27/2019   Goals of care, counseling/discussion    Palliative care encounter    Tachycardia    Lower extremity edema    Acute metabolic encephalopathy    Obstructive sleep apnea    Morbid obesity with BMI of 60.0-69.9, adult (HCC)    Urinary retention    Type 2 diabetes mellitus without complication, with long-term current use of insulin (HCC)    Skin ulcer of sacrum with necrosis of muscle (HCC)    SIRS (systemic inflammatory response syndrome) ([place]) 06/05/2019   Acute lower UTI 06/05/2019   VRE (vancomycin-resistant Enterococci) infection 06/05/2019   Anemia 06/05/2019   Sepsis due to vancomycin resistant Enterococcus species ([place]) 06/06/2019   Resolved Ambulatory Problems    Diagnosis Date Noted   No Resolved Ambulatory  Problems   Past Medical History:  Diagnosis Date   Diabetes mellitus ([place])    Hypertension    Stroke ([place])    SOCIAL HX:  Social History   Tobacco Use   Smoking status: Never   Smokeless tobacco: Never  Substance Use Topics   Alcohol use: No   FAMILY HX:  Family History  Problem Relation Age of Onset   Diabetes Other    Hypertension Other       ALLERGIES:  Allergies  Allergen Reactions   Morphine Hives    Patient has tolerated oxycodone at high amounts during Nov 2020 hospitalization   Benzonatate Other (See Comments)     PERTINENT MEDICATIONS:  Outpatient Encounter  Medications as of 12/07/2021  Medication Sig   albuterol (VENTOLIN HFA) 108 (90 Base) MCG/ACT inhaler Inhale 3 puffs into the lungs every 6 (six) hours as needed for wheezing or shortness of breath.   amLODipine (NORVASC) 2.5 MG tablet Take 2.5 mg by mouth daily.   ascorbic acid (VITAMIN C) 500 MG tablet Take 1 tablet (500 mg total) by mouth 2 (two) times daily.   aspirin 81 MG chewable tablet Place 1 tablet (81 mg total) into feeding tube daily.   doxycycline (MONODOX) 50 MG capsule Take 1 capsule (50 mg total) by mouth daily. Take with food and drink. Do not lay down for 30 minutes after taking. Be cautious with sun exposure and use good sun protection while on this medication.   famotidine (PEPCID) 20 MG tablet Place 1 tablet (20 mg total) into feeding tube 2 (two) times daily.   furosemide (LASIX) 20 MG tablet Place 1 tablet (20 mg total) into feeding tube daily.   ketoconazole (NIZORAL) 2 % cream Apply topically 2 (two) times daily.   LYSINE PO Take 50 mg by mouth daily.   magnesium oxide (MAG-OX) 400 MG tablet Place 400 mg into feeding tube daily.    metoprolol tartrate (LOPRESSOR) 25 MG tablet Place 1 tablet (25 mg total) into feeding tube 3 (three) times daily.   Multiple Vitamins-Minerals (MULTIVITAMIN WITH MINERALS) tablet Take 1 tablet by mouth daily.   nystatin (MYCOSTATIN/NYSTOP) powder Apply 1 Application topically daily.   traMADol (ULTRAM) 50 MG tablet Take 50 mg by mouth every 4 (four) hours.   triamcinolone cream (KENALOG) 0.1 % Apply 1 Application topically 2 (two) times daily as needed (Rash). Use at left earlobe 5 x weekly. Avoid applying to face, groin, and axilla. Use as directed.   XARELTO 10 MG TABS tablet Take 10 mg by mouth daily.   Zinc 50 MG TABS Take 50 mg by mouth daily.   No facility-administered encounter medications on file as of 12/07/2021.   Thank you for the opportunity to participate in the care of Mr. Abbey.  The palliative care team will continue to follow.  Please call our office at [phone] if we can be of additional assistance.   Ezekiel Slocumb, NP   COVID-19 PATIENT SCREENING TOOL Asked and negative response unless otherwise noted:  Have you had symptoms of covid, tested positive or been in contact with someone with symptoms/positive test in the past 5-10 days? No

## 2021-12-08 ENCOUNTER — Ambulatory Visit: Payer: BC Managed Care – PPO | Attending: Physician Assistant

## 2021-12-08 ENCOUNTER — Ambulatory Visit: Payer: BC Managed Care – PPO

## 2021-12-08 DIAGNOSIS — R278 Other lack of coordination: Secondary | ICD-10-CM | POA: Insufficient documentation

## 2021-12-08 DIAGNOSIS — I6930 Unspecified sequelae of cerebral infarction: Secondary | ICD-10-CM | POA: Diagnosis not present

## 2021-12-08 DIAGNOSIS — R41841 Cognitive communication deficit: Secondary | ICD-10-CM | POA: Diagnosis not present

## 2021-12-08 DIAGNOSIS — M6281 Muscle weakness (generalized): Secondary | ICD-10-CM | POA: Insufficient documentation

## 2021-12-08 DIAGNOSIS — R269 Unspecified abnormalities of gait and mobility: Secondary | ICD-10-CM | POA: Diagnosis not present

## 2021-12-08 DIAGNOSIS — R2689 Other abnormalities of gait and mobility: Secondary | ICD-10-CM | POA: Diagnosis not present

## 2021-12-08 DIAGNOSIS — R2681 Unsteadiness on feet: Secondary | ICD-10-CM | POA: Insufficient documentation

## 2021-12-08 DIAGNOSIS — R262 Difficulty in walking, not elsewhere classified: Secondary | ICD-10-CM | POA: Diagnosis not present

## 2021-12-08 NOTE — Therapy (Signed)
OUTPATIENT PHYSICAL THERAPY TREATMENT NOTE   Patient Name: Paul Hall MRN: [id number] DOB:[birth year], 26 y.o., male Today's Date: 12/09/2021  PCP:  Loistine Chance PROVIDER:  Collene Mares, PA-C   PT End of Session - 12/08/21 1510     Visit Number 56    Number of Visits 29    Date for PT Re-Evaluation 12/08/21    Authorization Type [employer] and [employer]- Need auth past 12th visit    Authorization Time Period 01/04/21-03/29/21; Recert 17/51/0258-08/29/7822; Recert 2/35/3614- 07/06/1538    Progress Note Due on Visit 57    PT Start Time 1515    PT Stop Time 1551    PT Time Calculation (min) 36 min    Equipment Utilized During Treatment Gait belt   Hoyer lift   Activity Tolerance Patient tolerated treatment well   queasiness/nausea   Behavior During Therapy WFL for tasks assessed/performed              Past Medical History:  Diagnosis Date   Diabetes mellitus ([place])    Hypertension    Stroke [facility])    Past Surgical History:  Procedure Laterality Date   IVC FILTER PLACEMENT ([place] [facility])     LEG SURGERY     PEG TUBE PLACEMENT     TRACHEOSTOMY     Patient Active Problem List   Diagnosis Date Noted   Sepsis due to vancomycin resistant Enterococcus species ([place]) 06/06/2019   SIRS (systemic inflammatory response syndrome) ([place]) 06/05/2019   Acute lower UTI 06/05/2019   VRE (vancomycin-resistant Enterococci) infection 06/05/2019   Anemia 06/05/2019   Skin ulcer of sacrum with necrosis of muscle ([place])    Urinary retention    Type 2 diabetes mellitus without complication, with long-term current use of insulin (HCC)    Tachycardia    Lower extremity edema    Acute metabolic encephalopathy    Obstructive sleep apnea    Morbid obesity with BMI of 60.0-69.9, adult ([place])    Goals of care, counseling/discussion    Palliative care encounter    Sepsis ([place]) 04/27/2019   H/O insulin dependent diabetes mellitus 04/27/2019   History of CVA with residual deficit 04/27/2019    Seizure disorder ([place]) 04/27/2019   Decubitus ulcer of sacral region, stage 4 ([place]) 04/27/2019    REFERRING DIAG: Cerebral infarction, unspecified   THERAPY DIAG:  Muscle weakness (generalized)  Other lack of coordination  History of CVA with residual deficit  Difficulty in walking, not elsewhere classified  Unsteadiness on feet  Abnormality of gait and mobility  Other abnormalities of gait and mobility  Cognitive communication deficit  Rationale for Evaluation and Treatment Rehabilitation  PERTINENT HISTORY: Paul Hall is a 25yoM who presents with severe weakness, quadriparesis, altered sensorium, and visual impairment s/p critical illness and prolonged hospitalization. Pt hospitalized in October 2020 with ARDS 2/2 COVID19 infection. Pt sustained a complex and lengthy hospitalization which included tracheostomy, prolonged sedation, ECMO. In this period pt sustained CVA and SDH. Pt has now been liberated from tracheostomy and G-tube. Pt has since been hospitalized for wound infection and UTI. Pt lives with parents at home, has hospital bed and left chair, hoyer lift transfers, and power WC for mobility needs. Pt needs heavy physical assistance with ADL 2/2 BUE contractures and motor dysfunction   PRECAUTIONS: Fall  SUBJECTIVE: Patient reports back pain is better and states has not required any meds for past several days. States he forgot to take some tylenol prior to session.   PAIN:  Are you  having pain? No     TODAY'S TREATMENT:   Lift support unavailable for session so since many goals are transfer/sitting/standing related - will perform recert next visit but did reassess muscle strength today.     Therapeutic Exercises:  PROM- Hamstring stretch BLE x 30 sec x 3 each LE PROM- Hip ER/IR BLE x 30 sec x 3 each LE Hip circles x 20 reps CW then 20 more CCW Each LE Hip abduction stretch - PT pushing knees together x 30 sec x 3 each LE Calf press against footplate 2  x 15 reps each. Seated hip march 2 x 15 reps each LE (AAROM)  Seated Knee ext  2 x15 reps each LE (AROM)   Seated hip abd 2 x 15 reps each LE Seated Hip add  2x15 reps each LE.     Education provided throughout session via VC/TC and demonstration to facilitate movement at target joints and correct muscle activation for all testing and exercises performed.       PATIENT EDUCATION: Education details: posture cues with sitting Person educated: Patient Education method: Explanation, Demonstration, Tactile cues, and Verbal cues Education comprehension: verbalized understanding, returned demonstration, verbal cues required, tactile cues required, and needs further education   HOME EXERCISE PROGRAM:    PT Short Term Goals -       PT SHORT TERM GOAL #1   Title Pt will be independent with HEP in order to improve strength and balance in order to decrease fall risk and improve function at home and work.    Baseline 01/04/2021= No formal HEP in place; 12/12 no HEP in place; 05/10/2021-Patient and his father were able to report compliance with curent HEP consisting of mostly seated/reclined LE strengthening. Both verbalize no questions at this time.    Time 6    Period Weeks    Status Achieved    Target Date 02/15/21              PT Long Term Goals -      PT LONG TERM GOAL #1   Title Patient will increase BLE gross strength by 1/2 muscle grade to improve functional strength for improved independence with potential gait, increased standing tolerance and increased ADL ability.    Baseline 01/04/2021- Patient presents with 1/5 to 3-/5 B LE strength with MMT; 12/12: goal partially met for Left knee/hip; 05/10/2021= 2-/5 bilateal Hip flex; 3+/5 bilateral Knee ext; 06/21/2021= Patient presents with 2-/5 bilateral Hip flex; 3+/5 bilateral knee ext/flex; 2-/5 left ankle DF; 0/5 right ankle- and able to increase reps and resistance with LE's. 09/15/2021- Patient technically presents with 2-/5 B hip  flex/abd/add - but he is able to raise his hip up to approx 100 deg which has improved. 3+/5 Bilateral knee ext, 2-/5 left ankle and 0/5 right ankle. 12/08/2021= Patient able to lift left knee at 110 deg of hip flex; presents with 3+/5 knee ext, 2-/5 left ankle DF and 0/5 right ankle DF, 2-/5 bilateral Hip abd in seated position.    Time 12    Period Weeks    Status On-going    Target Date 12/08/21      PT LONG TERM GOAL #2   Title Patient will tolerate sitting unsupported demonstrating erect sitting posture for 15 minutes with CGA to demonstrate improved back extensor strength and improved sitting tolerance.    Baseline 01/04/2021- Patient confied to sitting in lift chair or electric power chair with back support and unable to sit upright without physical assistance;  12/12: tolerates <1 minutes upright unsupported sitting. 05/10/2021=static sit with forward trunk lean  in his power wheelchair without back support x approx 3 min. 06/21/2021=Unable to assess today due to patient with acute back pain but on previous visit able to sit x 8 min without back support. 09/15/2021- on last visit- 09/13/2021- patient was able to sit unsupported x 8 min at edge of mat. 10/13/2023 - Patient was able to sit at edge of mat with varying level of assist today from SBA to min A for a total of 20 min.    Time 12    Period Weeks    Status On-going    Target Date 12/08/21      PT LONG TERM GOAL #3   Title Patient will demonstrate ability to perform static standing in // bars > 2 min with Max Assist  without loss of balance and fair posture for improved overall strength for pre-gait and transfer activities.    Baseline 01/04/2021= Patient current uanble to stand- Dependent on hoyer or sit to stand lift for transfers. 05/10/2021=Not appropriate yet- Currently still dependent with all transfers using hoyer. 06/21/2021= Patient continuing now to focus on LE strengthening to prepare for standing-unable to try today due to acute low back  pain-  planning on attempting in new cert period. 09/15/2021- Patient has attempted standing 2x in past two week- max Assist of 2 people - only once was he successful to clearing his bottom from chair - Will continue to be a focus during the new certification.    Time 12    Period Weeks    Status On-going    Target Date 12/08/21      PT LONG TERM GOAL #4   Title Pt will improve FOTO score by 10 points or more demonstrating improved perceived functional ability    Baseline FOTO 7 on 10/17; 03/15/21: FOTO 12; 05/10/2021 06/21/2021= 1; 09/15/2021= 9    Time 12    Period Weeks    Status On-going    Target Date 12/08/21      PT LONG TERM GOAL #5   Title Patient will perform sit to stand transfer with appropriate AD and max assist of 2 people with 75% consistency to prepare for pregait activities.    Baseline 09/15/2021= Patient unable to stand well- unable to clear his bottom off chair with Max assist of 2 persons.    Time 12    Period Weeks    Status New    Target Date 12/08/21              Plan     Clinical Impression Statement Patient presents as motivated and able to perform all exercises and stretching today without report of back pain. No lift available so will reassess all goals next visit for recert. He does present with increased Bilateral LE strength  as seen by ability to lift knee up higher, performing full knee ext with increased time on right LE. Patient would benefit from additional skilled PT intervention to improve strength, balance, and mobility for improved quality of life.    Personal Factors and Comorbidities Comorbidity 3+;Time since onset of injury/illness/exacerbation    Comorbidities CVA, diabetes, Seizures    Examination-Activity Limitations Bathing;Bed Mobility;Bend;Caring for Others;Carry;Dressing;Hygiene/Grooming;Lift;Locomotion Level;Reach Overhead;Self Feeding;Sit;Squat;Stairs;Stand;Transfers;Toileting    Examination-Participation Restrictions Cleaning;Community  Activity;Driving;Laundry;Medication Management;Meal Prep;Occupation;Personal Finances;Shop;Yard Work;Volunteer    Stability/Clinical Decision Making Evolving/Moderate complexity    Rehab Potential Fair    PT Frequency 2x / week    PT Duration 12  weeks    PT Treatment/Interventions ADLs/Self Care Home Management;Cryotherapy;Electrical Stimulation;Moist Heat;Ultrasound;DME Instruction;Gait training;Stair training;Functional mobility training;Therapeutic exercise;Balance training;Patient/family education;Orthotic Fit/Training;Neuromuscular re-education;Wheelchair mobility training;Manual techniques;Passive range of motion;Dry needling;Energy conservation;Taping;Visual/perceptual remediation/compensation;Joint Manipulations    PT Next Visit Plan core strength- sitting at EOM, postural control, sabina sit to stand if able; Continue with progressive LE Strengthening    PT Home Exercise Plan No changes to HEP today    Consulted and Agree with Plan of Care Patient;Family member/caregiver    Family Member Consulted Dad               Lewis Moccasin, PT 12/09/2021, 5:04 PM

## 2021-12-08 NOTE — Therapy (Signed)
OUTPATIENT OCCUPATIONAL THERAPY DISCHARGE NOTE   Patient Name: Paul Hall MRN: [id number] DOB:1995-09-11, 26 y.o., male Today's Date: 12/09/2021  REFERRING PROVIDER:  Collene Mares, MD .otendofsession  OT End of Session - 11/24/21 1807     Visit Number [id number]    Number of Visits 18    Date for OT Re-Evaluation 12/07/21    Authorization Type Progress report period starting 10/06/2021    OT Start Time 1515    OT Stop Time 1600    OT Time Calculation (min) 45 min    Activity Tolerance Patient tolerated treatment well    Behavior During Therapy WFL for tasks assessed/performed                 Past Medical History:  Diagnosis Date   Diabetes mellitus ([place])    Hypertension    Stroke [facility])    Past Surgical History:  Procedure Laterality Date   IVC FILTER PLACEMENT ([place] [facility])     LEG SURGERY     PEG TUBE PLACEMENT     TRACHEOSTOMY     Patient Active Problem List   Diagnosis Date Noted   Sepsis due to vancomycin resistant Enterococcus species ([place]) 06/06/2019   SIRS (systemic inflammatory response syndrome) ([place]) 06/05/2019   Acute lower UTI 06/05/2019   VRE (vancomycin-resistant Enterococci) infection 06/05/2019   Anemia 06/05/2019   Skin ulcer of sacrum with necrosis of muscle ([place])    Urinary retention    Type 2 diabetes mellitus without complication, with long-term current use of insulin ([facility])    Tachycardia    Lower extremity edema    Acute metabolic encephalopathy    Obstructive sleep apnea    Morbid obesity with BMI of 60.0-69.9, adult ([place])    Goals of care, counseling/discussion    Palliative care encounter    Sepsis ([place]) 04/27/2019   H/O insulin dependent diabetes mellitus 04/27/2019   History of CVA with residual deficit 04/27/2019   Seizure disorder ([place]) 04/27/2019   Decubitus ulcer of sacral region, stage 4 ([place]) 04/27/2019      REFERRING DIAG: CVA, COVID-19  THERAPY DIAG:  Muscle weakness (generalized)  Other lack of  coordination  History of CVA with residual deficit  Rationale for Evaluation and Treatment Rehabilitation  PERTINENT HISTORY: Pt. is a 26 y.o. male who was hospitalized, and diagnosed with COVID-19, sustained a CVA with resultant quadreplegia in 01/2019. Pt. was hospitalized with VRE  UTI. Pt. PMHx includes: urinary retention, Seizure Disorder, Obstructive Sleep Disorder, DM Type II, and Morbid obesity. Pt. resides at home with his parents  PRECAUTIONS: No  SUBJECTIVE:  Pt reports feeling well today and states he definitely feels his hands have loosened since his latest Botox injections a couple of weeks ago.   PAIN:  Are you having pain? No pain   OBJECTIVE:  R grip 4#, L 0# R lateral pinch 5#, L 2# R shoulder abd 0-115 9 hole peg test: unable bilat  TODAY'S TREATMENT:     Therapeutic Exercise:  Objective measures taken.  HEP reviewed, including continued use of theraputty in the home, caregivers to perform daily PROM to bilat wrist and hands, and pt to continue to engage the BUEs into ADL tasks and bed mobility as able.  Self Care: Worked with long handled comb which pt can reach top and L side of head when holding comb with L hand.  D/t poor grip and pinch strength and limited wrist mobility, pt was unable to manage any more than touching the  comb to these areas and was unable to actually comb hair.  Reviewed various ADL tasks which pt needs to be completing each day rather than caregivers providing max-total assist, including washing face and brushing teeth with electric tooth brush.  Reinforced caregivers to assist for thoroughness only after pt makes his best efforts with these tasks.  Reinforced additional opportunities to engage the BUEs, including reaching toward bed rails when rolling, lifting arms to don a shirt and perform UB washing to areas where pt can reach.  Educated on importance of splint wearing to reduce contracture risk which will in turn help to position hands in a  functional position with less pain.  Referral has been requested from Dr. Norton Pastel for pt to be assessed at [facility] in town for splinting needs.  Father inquired about additional grip strengthening and dexterity to work towards pt being able to hold to parallel bars or walker.  OT reinforced that pt should continue to perform HEP for strengthening and dexterity skills with his theraputty, and that progress in these areas has been limited due to severity of spasticity in bilat hands, which tends to increase with more exertion such as attempting to stand, and ability to hold a walker may be limited.  Educated however, that if pt worked up to standing, there are other options he may explore with PT such as a platform walker where hands may be strapped to the handles of the walker and pt would rely on proximal strength through the upper arms and shoulders for upright standing.  Conference held with pt/father/(mother via speaker phone per spouse's preference) and 2 primary Occupational Therapists involved in pt's care, including this Probation officer, to discuss pt meeting max rehab potential for OT at this time based on limited progress with objective measures and functional performance.  Pt/family verbalized understanding, though disappointed.  Reassured pt/family that another referral could be made to OT services if pt experiences any changes with his self care or BUE function in the future that may warrant additional therapy.       PATIENT EDUCATION: Education details: HEP; reason for d/c Person educated: pt/father/mother Education method: vc/explanation Education comprehension: verbalized understanding    HOME EXERCISE PROGRAM   BUE, hand, and digit ROM, theraputty exercises; encouraged short periods of upright sitting in wc with positional changes as needed to build up sitting tolerance for possible eventual progression to standing (with PT)   OT Short Term Goals - 09/13/21 1641       OT SHORT TERM GOAL  #1   Title Pt and caregiver will obtain splint and follow wearing schedule consistently 7 days/week    Baseline 09/14/21: working to obtain splint; 12/08/21 d/c: pt referred to [facility] for assessment of splinting needs (pt's soft/flexible splint does not fit appropriately d/t severity of pt's spasticity)    Time 6    Period Weeks    Status Deferred   Target Date 10/26/21              OT Long Term Goals - 09/13/21 1623       OT LONG TERM GOAL #1   Title Pt. will improve FOTO score by 5 points for clinically significant improvements during ADLs, and IADLs,    Baseline Eval: 41; 10th Visit: FOTO score 42; 21st: FOTO 43; 30th visit: FOTO: 37; FOTO score: 39; 50th: FOTO 52; 09/13/21: FOTO 41; 12/08/21 d/c: 42   Time 12    Period Weeks    Status Not met   Target  Date 12/07/21      OT LONG TERM GOAL #2   Title Pt. will improve right shoulder flexion by 15 degrees to assist caregivers with UE dressing    Baseline Pt. continues to require assist with UE dressing secondary to limited ROM; 08/16/21: Max A-total A UB dressing; R shoulder flexion/abd combo 0-100; 09/13/21: R shoulder flexion/abd combo 0-110, R shoulder hiking noted; 12/08/21: R shoulder flexion/abd combo 0-115 Mod-max A UB dressing d/t having to dress at bed level/rolling d/t limited trunk control; also limited dexterity in the hands for helping to pull down a shirt.  Pt can lift arms into shirt sleeves but can not pull down.   Time 12    Period Weeks    Status Slightly improved   Target Date 12/07/21      OT LONG TERM GOAL #3   Title Pt. will improve right Abduction by 10 degrees to assist caregivers with underarm hygiene care.    Baseline Eval: AROM abduction RUE 76. Pt. continues to present with limited bilateral abduction. 21st: AROM abduction RUE 80.06/16/2021: Right shulder abduction 108. 06/23/2021: Right shoulder abduction: 110; 08/16/21: abd 110; 12/08/21: abd 115   Time 12    Period Weeks    Status Achieved    Target Date  09/08/21      OT LONG TERM GOAL #4   Title Pt. will demonstrate visual compensatory strategies for navigating through his environment.    Baseline 30th visit: Pt. continues to require assist to navigate through his environment. 20th: Pt continues to require assist to navigate environment, minimal knowledge of strategies. Eval: Limited 06/23/2021: Pt. continues to be limited, and requires assist  with navigating through his environment; 08/16/21: father reports pt only steers his own wc in 1 open room of their house, otherwise dep to navigate environment. 09/13/21: limited steering outside of house, steers in 1 room of house. 09/13/21: pt seeing low vision OT specialist to address; 12/08/21: same as 09/13/21 status   Time 12    Period Weeks    Status Deferred    Target Date 09/08/21      OT LONG TERM GOAL #5   Title Pt. will demonstrate visual compensatory strategies for tabletop tasks within his near space.    Baseline 06/16/2021: Pt. continues to present with visual limitations for tabletop tasks in his near space. Pt. continues to present with limited vision. 21st: Pt can identify objects on tabletop ~waterbottle sized, improves with contrasting colours, cannot ID small onjects; 08/16/21: no change from last assessment period. 09/13/21: pt seeing low vision OT specialist to address; 12/08/21: family provides set up of all ADL supplies within his near space (no significant changes in vision)   Time 12    Period Weeks    Status Deferred    Target Date 09/08/21      OT LONG TERM GOAL #6   Title Pt. will perform self feeding with SETUP using adaptive equipment    Baseline 06/23/2021: Pt. is able to use his bilateral hands to perfrom hand to mouth patterns for self-feeding with finger foods. Pt. is now able to use his left hand to bring a water bottle to his mouth,a nd drink from it. 30th visit: Pt. continues to present with weakness limiting functional hand to mouth patterns of movement. Pt. is able to perform  hand to mouth patterns, however reports weakness in his hand  limits a secure grasp on the utensil. Pt. requires assist to perform; Eval: Pt. has difficulty; 08/16/21: pt continues  to use L hand for self feeding finger foods with set up.  Spasticity limits dexterity to manipulate eating utensils. 09/13/21: can grip built up handle however requires MIN A 2/2 vision and wrist ROM deficits; 12/08/21: pt reports that he fed himself eggs with fork this week (90% of eggs) and is indep to self feed finger foods.   Time 12    Period Weeks    Status Partially met   Target Date 12/07/21      OT LONG TERM GOAL #7   Title Pt. will perform one step self-grooming tasks with SETUP    Baseline 06/23/2021: Pt. is able to perform the hand to face pattern with the left hand, however has difficulty actively sustaining his BUEs in elevation long enough to perform self-grooming tasks. 06/16/2021: Pt. is able to perform the hand to face pattern with the left hand, however has difficulty actively sustaining his BUEs in elevation long enough to perform self-grooming tasks; 08/16/21: same as 06/16/21. 09/13/21: limited by trunk and sustaining BUEs; 12/08/21 d/c: pt has electric tooth brush which he can hold and use, but father states he or spouse brush pt's teeth most of the time.  Pt has been encouraged to only have caregivers assist for thoroughness, and he should attempt this task 2x daily with set up of toothpaste on toothbrush as needed.   Time 12    Period Weeks    Status Partially met   Target Date 12/07/21      OT LONG TERM GOAL #8   Title Pt will complete UB dressing tasks with SETUP and assist to thread over hands only    Baseline 06/23/2021: Pt. requires MinA to thread arms, and requires maxA to donn over upper arms, MaxAx2 to donn over his back. 06/16/2021: Pt. has progressed  to require MinA to thread arms, and requires maxA to donn over upper arms, MaxAx2 to donn over his back; 08/16/21: min A to thread arms, max A to don  over upper arms and over head. 09/13/21: MOD A; 12/08/21: mod-max A   Time 12    Period Weeks    Status Partially met   Target Date 12/07/21      OT LONG TERM GOAL  #9   TITLE Pt will improve B grip strength by 10# to assist in handling ADL items    Baseline 08/16/21: bilat grip 0#; 09/13/21: L grip 6#, R grip 9#; 12/08/21: L grip 0#, R grip 4# (hands have loosened some since last Botox injections, but pain has improved)   Time 12    Period Weeks    Status Not met   Target Date 12/06/21      OT LONG TERM GOAL  #10   TITLE Pt will improve B lateral pinch strength by 8# to assist in UBD.    Baseline N/A: awaiting pinch meter to be fixed. Pt. unable to formulate proper positioning for lateral pinch assessment of the replacement pinch meter; 08/16/21: R 2#, L 1.5#; ; 09/13/21: R 6#, L 2#; 12/08/21: R 5#, L 2#   Time 12    Period Weeks    Status Not met    Target Date 12/07/21              Plan - 09/23/21 1416     Clinical Impression Statement Discharge assessment completed this date.  Reviewed various ADL tasks which pt needs to be completing each day rather than caregivers providing max-total assist, including washing face and brushing teeth with electric  tooth brush.  Reinforced caregivers to assist for thoroughness only after pt makes his best efforts with these tasks.  Reinforced additional opportunities to engage the BUEs, including reaching toward bed rails when rolling, lifting arms to don a shirt and perform UB washing to areas where pt can reach.  Educated on importance of splint wearing to reduce contracture risk which will in turn help to position hands in a functional position with less pain.  Referral has been requested from Dr. Norton Pastel for pt to be assessed at [facility] in town for splinting needs.  Father inquired about additional grip strengthening and dexterity to work towards pt being able to hold to parallel bars or walker.  OT reinforced that pt should continue to perform HEP  for strengthening and dexterity skills with his theraputty, and that progress in these areas has been limited due to severity of spasticity in bilat hands, which tends to increase with more exertion such as attempting to stand, and ability to hold a walker may be limited.  Educated however, that if pt worked up to standing, there are other options he may explore with PT such as a platform walker where hands may be strapped to the handles of the walker and pt would rely on proximal strength through the upper arms and shoulders for upright standing.  Conference held with pt/father/(mother via speaker phone per spouse's preference) and 2 primary Occupational Therapists involved in pt's care, including this Probation officer, to discuss pt meeting max rehab potential for OT at this time based on limited progress with objective measures and functional performance.  Pt/family verbalized understanding, though disappointed.  Reassured pt/family that another referral could be made to OT services if pt experiences any changes with his self care or BUE function in the future that may warrant additional therapy.  No additional skilled OT indicated at this time.       OT Occupational Profile and History Detailed Assessment- Review of Records and additional review of physical, cognitive, psychosocial history related to current functional performance    Occupational performance deficits (Please refer to evaluation for details): ADL's;IADL's    Body Structure / Function / Physical Skills ADL;FMC;ROM;Dexterity;IADL;Endurance;Obesity;Strength;UE functional use    Rehab Potential Good    Clinical Decision Making Multiple treatment options, significant modification of task necessary    Comorbidities Affecting Occupational Performance: Presence of comorbidities impacting occupational performance    Modification or Assistance to Complete Evaluation  Max significant modification of tasks or assist is necessary to complete    OT Frequency  1x/week    OT Duration 1 week   OT Treatment/Interventions Self-care/ADL training;Therapeutic exercise;Neuromuscular education;Visual/perceptual remediation/compensation;Patient/family education;Therapeutic activities;DME and/or AE instruction;Passive range of motion;Moist Heat;Electrical Stimulation    Consulted and Agree with Plan of Care Patient/mother/father           Leta Speller, MS, OTR/L    Darleene Cleaver, OT 12/09/2021, 7:28 PM

## 2021-12-13 ENCOUNTER — Ambulatory Visit: Payer: BC Managed Care – PPO

## 2021-12-13 DIAGNOSIS — R262 Difficulty in walking, not elsewhere classified: Secondary | ICD-10-CM | POA: Diagnosis not present

## 2021-12-13 DIAGNOSIS — I6930 Unspecified sequelae of cerebral infarction: Secondary | ICD-10-CM

## 2021-12-13 DIAGNOSIS — R41841 Cognitive communication deficit: Secondary | ICD-10-CM | POA: Diagnosis not present

## 2021-12-13 DIAGNOSIS — R278 Other lack of coordination: Secondary | ICD-10-CM

## 2021-12-13 DIAGNOSIS — R2681 Unsteadiness on feet: Secondary | ICD-10-CM

## 2021-12-13 DIAGNOSIS — R2689 Other abnormalities of gait and mobility: Secondary | ICD-10-CM | POA: Diagnosis not present

## 2021-12-13 DIAGNOSIS — R269 Unspecified abnormalities of gait and mobility: Secondary | ICD-10-CM

## 2021-12-13 DIAGNOSIS — M6281 Muscle weakness (generalized): Secondary | ICD-10-CM

## 2021-12-14 NOTE — Therapy (Signed)
OUTPATIENT PHYSICAL THERAPY TREATMENT NOTE/RECERTIFICATION   Patient Name: Paul Hall MRN: [id number] DOB:05-25-1995, 26 y.o., male Today's Date: 12/14/2021  PCP:  Loistine Chance PROVIDER:  Collene Mares, PA-C   PT End of Session - 12/14/21 0844     Visit Number 57    Number of Visits 45    Date for PT Re-Evaluation 03/07/22    Authorization Type [employer] and [employer]- Need auth past 12th visit    Authorization Time Period 01/04/21-03/29/21; Recert 25/08/3974-7/34/1937; Recert 12/05/4095- 06/06/3297    Progress Note Due on Visit 29    PT Start Time 1602    PT Stop Time 1645    PT Time Calculation (min) 43 min    Equipment Utilized During Treatment Gait belt   Hoyer lift   Activity Tolerance Patient tolerated treatment well   queasiness/nausea   Behavior During Therapy WFL for tasks assessed/performed              Past Medical History:  Diagnosis Date   Diabetes mellitus ([place])    Hypertension    Stroke [facility])    Past Surgical History:  Procedure Laterality Date   IVC FILTER PLACEMENT ([place] [facility])     LEG SURGERY     PEG TUBE PLACEMENT     TRACHEOSTOMY     Patient Active Problem List   Diagnosis Date Noted   Sepsis due to vancomycin resistant Enterococcus species ([place]) 06/06/2019   SIRS (systemic inflammatory response syndrome) ([place]) 06/05/2019   Acute lower UTI 06/05/2019   VRE (vancomycin-resistant Enterococci) infection 06/05/2019   Anemia 06/05/2019   Skin ulcer of sacrum with necrosis of muscle ([place])    Urinary retention    Type 2 diabetes mellitus without complication, with long-term current use of insulin ([facility])    Tachycardia    Lower extremity edema    Acute metabolic encephalopathy    Obstructive sleep apnea    Morbid obesity with BMI of 60.0-69.9, adult ([place])    Goals of care, counseling/discussion    Palliative care encounter    Sepsis ([place]) 04/27/2019   H/O insulin dependent diabetes mellitus 04/27/2019   History of CVA with residual  deficit 04/27/2019   Seizure disorder ([place]) 04/27/2019   Decubitus ulcer of sacral region, stage 4 ([place]) 04/27/2019    REFERRING DIAG: Cerebral infarction, unspecified   THERAPY DIAG:  Muscle weakness (generalized) - Plan: PT plan of care cert/re-cert  Other lack of coordination - Plan: PT plan of care cert/re-cert  History of CVA with residual deficit - Plan: PT plan of care cert/re-cert  Difficulty in walking, not elsewhere classified - Plan: PT plan of care cert/re-cert  Unsteadiness on feet - Plan: PT plan of care cert/re-cert  Abnormality of gait and mobility - Plan: PT plan of care cert/re-cert  Other abnormalities of gait and mobility - Plan: PT plan of care cert/re-cert  Cognitive communication deficit - Plan: PT plan of care cert/re-cert  Rationale for Evaluation and Treatment Rehabilitation  PERTINENT HISTORY: Nyair Depaulo is a 25yoM who presents with severe weakness, quadriparesis, altered sensorium, and visual impairment s/p critical illness and prolonged hospitalization. Pt hospitalized in October 2020 with ARDS 2/2 COVID19 infection. Pt sustained a complex and lengthy hospitalization which included tracheostomy, prolonged sedation, ECMO. In this period pt sustained CVA and SDH. Pt has now been liberated from tracheostomy and G-tube. Pt has since been hospitalized for wound infection and UTI. Pt lives with parents at home, has hospital bed and left chair, hoyer lift transfers, and power  WC for mobility needs. Pt needs heavy physical assistance with ADL 2/2 BUE contractures and motor dysfunction   PRECAUTIONS: Fall  SUBJECTIVE: Patient reports doing well overall and denies any pain today. Agreeable to recert visit and would like to continue to work on improving strength/potentially standing/walking   PAIN:  Are you having pain? No     TODAY'S TREATMENT:   Reassessed goals for recert visit- Please refer to goal section for details and updates.   Therapeutic  activities:   [name] transfer over to mat table- dependent on PT to transfer. Once on edge of mat- patient required max assist from reclined position (wedge +2 pillows under back/head) to full erect sitting without support. Patient then able to perform static sitting > 5 min then proceed to some dynamic trunk weight shifting- forward/backward/lateral with CGA. (6" block positioned under feet) and patient then able to perform dynamic reaching with BUE- punching/reaching forward/diagonal and then able to perform minimal hip march and almost full LAQ in seated position. Patient reported fatigue between 20-25 min of sitting an dependent positioned from mat back to power wheelchair.   Education provided throughout session via VC/TC and demonstration to facilitate movement at target joints and correct muscle activation for all testing and exercises performed.       PATIENT EDUCATION: Education details: posture cues with sitting Person educated: Patient Education method: Explanation, Demonstration, Tactile cues, and Verbal cues Education comprehension: verbalized understanding, returned demonstration, verbal cues required, tactile cues required, and needs further education   HOME EXERCISE PROGRAM:    PT Short Term Goals -       PT SHORT TERM GOAL #1   Title Pt will be independent with HEP in order to improve strength and balance in order to decrease fall risk and improve function at home and work.    Baseline 01/04/2021= No formal HEP in place; 12/12 no HEP in place; 05/10/2021-Patient and his father were able to report compliance with curent HEP consisting of mostly seated/reclined LE strengthening. Both verbalize no questions at this time.    Time 6    Period Weeks    Status Achieved    Target Date 02/15/21              PT Long Term Goals -      PT LONG TERM GOAL #1   Title Patient will increase BLE gross strength by 1/2 muscle grade to improve functional strength for improved  independence with potential gait, increased standing tolerance and increased ADL ability.    Baseline 01/04/2021- Patient presents with 1/5 to 3-/5 B LE strength with MMT; 12/12: goal partially met for Left knee/hip; 05/10/2021= 2-/5 bilateal Hip flex; 3+/5 bilateral Knee ext; 06/21/2021= Patient presents with 2-/5 bilateral Hip flex; 3+/5 bilateral knee ext/flex; 2-/5 left ankle DF; 0/5 right ankle- and able to increase reps and resistance with LE's. 09/15/2021- Patient technically presents with 2-/5 B hip flex/abd/add - but he is able to raise his hip up to approx 100 deg which has improved. 3+/5 Bilateral knee ext, 2-/5 left ankle and 0/5 right ankle. 12/08/2021= Patient able to lift left knee at 110 deg of hip flex; presents with 3+/5 knee ext, 2-/5 left ankle DF and 0/5 right ankle DF, 2-/5 bilateral Hip abd in seated position.    Time 12    Period Weeks    Status On-going    Target Date 03/07/2022     PT LONG TERM GOAL #2   Title Patient will tolerate sitting unsupported  demonstrating erect sitting posture for 15 minutes with CGA to demonstrate improved back extensor strength and improved sitting tolerance.    Baseline 01/04/2021- Patient confied to sitting in lift chair or electric power chair with back support and unable to sit upright without physical assistance; 12/12: tolerates <1 minutes upright unsupported sitting. 05/10/2021=static sit with forward trunk lean  in his power wheelchair without back support x approx 3 min. 06/21/2021=Unable to assess today due to patient with acute back pain but on previous visit able to sit x 8 min without back support. 09/15/2021- on last visit- 09/13/2021- patient was able to sit unsupported x 8 min at edge of mat. 10/13/2023 - Patient was able to sit at edge of mat with varying level of assist today from SBA to min A for a total of 20 min. 12/13/2021= Patient demonstrated unsupported sitting at edge of mat for approx 20 min   Time 12    Period Weeks    Status GOAL MET    Target Date 12/08/21      PT LONG TERM GOAL #3   Title Patient will demonstrate ability to perform static standing in // bars > 2 min with Max Assist  without loss of balance and fair posture for improved overall strength for pre-gait and transfer activities.    Baseline 01/04/2021= Patient current uanble to stand- Dependent on hoyer or sit to stand lift for transfers. 05/10/2021=Not appropriate yet- Currently still dependent with all transfers using hoyer. 06/21/2021= Patient continuing now to focus on LE strengthening to prepare for standing-unable to try today due to acute low back pain-  planning on attempting in new cert period. 09/15/2021- Patient has attempted standing 2x in past two week- max Assist of 2 people - only once was he successful to clearing his bottom from chair - Will continue to be a focus during the new certification. 12/13/2021= Patient has been limited secondary to increased overall low back pain during this certification and will require more time to focus on this goal.    Time 12    Period Weeks    Status On-going    Target Date 03/07/2022     PT LONG TERM GOAL #4   Title Pt will improve FOTO score by 10 points or more demonstrating improved perceived functional ability    Baseline FOTO 7 on 10/17; 03/15/21: FOTO 12; 05/10/2021 06/21/2021= 1; 09/15/2021= 9; 12/13/2021= Will issue next visit    Time 12    Period Weeks    Status On-going    Target Date 03/07/2022     PT LONG TERM GOAL #5   Title Patient will perform sit to stand transfer with appropriate AD and max assist of 2 people with 75% consistency to prepare for pregait activities.    Baseline 09/15/2021= Patient unable to stand well- unable to clear his bottom off chair with Max assist of 2 persons. 12/13/2021- Goal not appropriate to try yet but will keep and roll over to next cert as shift continues to focus on transfers/standing   Time 12    Period Weeks    Status New    Target Date 03/07/2022   PT LONG TERM GOAL #6   Title Patient will tolerate sitting unsupported demonstrating erect sitting posture for 30 minutes with CGA to demonstrate improved back extensor strength and improved sitting tolerance.   Baseline 12/13/2021= Patient demonstrated unsupported sitting at edge of mat for approx 20 min  Time 12   Period Weeks   Status New  Target Date 03/07/2022            Plan     Clinical Impression Statement Patient presents as motivated and feeling better- able to hoyer transfer over to mat table and perform static sitting over 20 min today including some dynamic lateral/forward/backward weight shift. He was able to perform some dynamic LE activity while sitting without LOB. He has potential to continue to improve sitting posture and standing. In new cert - will hopefull shift focus to attempting standing/transfers. Patient's condition has the potential to improve in response to therapy. Maximum improvement is yet to be obtained. The anticipated improvement is attainable and reasonable in a generally predictable time.  Patient would benefit from additional skilled PT intervention to improve strength, balance, and mobility for improved quality of life.    Personal Factors and Comorbidities Comorbidity 3+;Time since onset of injury/illness/exacerbation    Comorbidities CVA, diabetes, Seizures    Examination-Activity Limitations Bathing;Bed Mobility;Bend;Caring for Others;Carry;Dressing;Hygiene/Grooming;Lift;Locomotion Level;Reach Overhead;Self Feeding;Sit;Squat;Stairs;Stand;Transfers;Toileting    Examination-Participation Restrictions Cleaning;Community Activity;Driving;Laundry;Medication Management;Meal Prep;Occupation;Personal Finances;Shop;Yard Work;Volunteer    Stability/Clinical Decision Making Evolving/Moderate complexity    Rehab Potential Fair    PT Frequency 2x / week    PT Duration 12 weeks    PT Treatment/Interventions ADLs/Self Care Home Management;Cryotherapy;Electrical Stimulation;Moist  Heat;Ultrasound;DME Instruction;Gait training;Stair training;Functional mobility training;Therapeutic exercise;Balance training;Patient/family education;Orthotic Fit/Training;Neuromuscular re-education;Wheelchair mobility training;Manual techniques;Passive range of motion;Dry needling;Energy conservation;Taping;Visual/perceptual remediation/compensation;Joint Manipulations    PT Next Visit Plan core strength- sitting at EOM, postural control, sabina sit to stand if able; Continue with progressive LE Strengthening    PT Home Exercise Plan No changes to HEP today    Consulted and Agree with Plan of Care Patient;Family member/caregiver    Family Member Consulted Dad               Lewis Moccasin, PT 12/14/2021, 5:18 PM

## 2021-12-15 ENCOUNTER — Ambulatory Visit: Payer: BC Managed Care – PPO

## 2021-12-15 ENCOUNTER — Telehealth: Payer: Self-pay

## 2021-12-15 DIAGNOSIS — M6281 Muscle weakness (generalized): Secondary | ICD-10-CM

## 2021-12-15 DIAGNOSIS — R269 Unspecified abnormalities of gait and mobility: Secondary | ICD-10-CM | POA: Diagnosis not present

## 2021-12-15 DIAGNOSIS — R2689 Other abnormalities of gait and mobility: Secondary | ICD-10-CM | POA: Diagnosis not present

## 2021-12-15 DIAGNOSIS — R41841 Cognitive communication deficit: Secondary | ICD-10-CM | POA: Diagnosis not present

## 2021-12-15 DIAGNOSIS — R2681 Unsteadiness on feet: Secondary | ICD-10-CM

## 2021-12-15 DIAGNOSIS — R278 Other lack of coordination: Secondary | ICD-10-CM | POA: Diagnosis not present

## 2021-12-15 DIAGNOSIS — R262 Difficulty in walking, not elsewhere classified: Secondary | ICD-10-CM | POA: Diagnosis not present

## 2021-12-15 DIAGNOSIS — I6930 Unspecified sequelae of cerebral infarction: Secondary | ICD-10-CM | POA: Diagnosis not present

## 2021-12-15 NOTE — Telephone Encounter (Signed)
Spoke with mother who shared that she had completed a CAP application for patient back in October and never heard anything. SW provided mother with [facility] SW contact (Angelica [phone]) to outreach about submitting anew CAP referral.   Mother had no other concers/questions for SW at this time. Mother has SW contact info if needled.

## 2021-12-16 NOTE — Therapy (Signed)
OUTPATIENT PHYSICAL THERAPY TREATMENT NOTE   Patient Name: Paul Hall MRN: [id number] DOB:[birth year], 26 y.o., male Today's Date: 12/16/2021  PCP:  Loistine Chance PROVIDER:  Collene Mares, PA-C   PT End of Session - 12/15/21 1400     Visit Number 90    Number of Visits 71    Date for PT Re-Evaluation 03/07/22    Authorization Type [employer] and [employer]- Need auth past 12th visit    Authorization Time Period 01/04/21-03/29/21; Recert 27/09/2374-2/83/1517; Recert 09/18/735- 1/0/6269    Progress Note Due on Visit 73    PT Start Time 1516    PT Stop Time 1550    PT Time Calculation (min) 34 min    Equipment Utilized During Treatment Gait belt   Hoyer lift   Activity Tolerance Patient tolerated treatment well   queasiness/nausea   Behavior During Therapy WFL for tasks assessed/performed              Past Medical History:  Diagnosis Date   Diabetes mellitus ([place])    Hypertension    Stroke [facility])    Past Surgical History:  Procedure Laterality Date   IVC FILTER PLACEMENT ([place] HX)     LEG SURGERY     PEG TUBE PLACEMENT     TRACHEOSTOMY     Patient Active Problem List   Diagnosis Date Noted   Sepsis due to vancomycin resistant Enterococcus species ([place]) 06/06/2019   SIRS (systemic inflammatory response syndrome) ([place]) 06/05/2019   Acute lower UTI 06/05/2019   VRE (vancomycin-resistant Enterococci) infection 06/05/2019   Anemia 06/05/2019   Skin ulcer of sacrum with necrosis of muscle ([place])    Urinary retention    Type 2 diabetes mellitus without complication, with long-term current use of insulin (HCC)    Tachycardia    Lower extremity edema    Acute metabolic encephalopathy    Obstructive sleep apnea    Morbid obesity with BMI of 60.0-69.9, adult ([place])    Goals of care, counseling/discussion    Palliative care encounter    Sepsis ([place]) 04/27/2019   H/O insulin dependent diabetes mellitus 04/27/2019   History of CVA with residual deficit 04/27/2019    Seizure disorder ([place]) 04/27/2019   Decubitus ulcer of sacral region, stage 4 ([place]) 04/27/2019    REFERRING DIAG: Cerebral infarction, unspecified   THERAPY DIAG:  Muscle weakness (generalized)  Other lack of coordination  History of CVA with residual deficit  Difficulty in walking, not elsewhere classified  Unsteadiness on feet  Abnormality of gait and mobility  Rationale for Evaluation and Treatment Rehabilitation  PERTINENT HISTORY: Paul Hall is a 25yoM who presents with severe weakness, quadriparesis, altered sensorium, and visual impairment s/p critical illness and prolonged hospitalization. Pt hospitalized in October 2020 with ARDS 2/2 COVID19 infection. Pt sustained a complex and lengthy hospitalization which included tracheostomy, prolonged sedation, ECMO. In this period pt sustained CVA and SDH. Pt has now been liberated from tracheostomy and G-tube. Pt has since been hospitalized for wound infection and UTI. Pt lives with parents at home, has hospital bed and left chair, hoyer lift transfers, and power WC for mobility needs. Pt needs heavy physical assistance with ADL 2/2 BUE contractures and motor dysfunction   PRECAUTIONS: Fall  SUBJECTIVE: Patient reports doing well overall and denies any pain today. Agreeable to recert visit and would like to continue to work on improving strength/potentially standing/walking   PAIN:  Are you having pain? No     TODAY'S TREATMENT:   Therapeutic activities:  Hoyer transfer from power w/c over to mat table- dependent on PT to transfer. Once on edge of mat- patient required max assist from reclined position (wedge +2 pillows under back/head) to full erect sitting without support. Patient then able to perform static sit for several min then progress to some dynamic trunk weight shifting with PT assist for positioning patient arms out to side with CGA. (6" block positioned under feet) and patient then able to perform dynamic  reaching with BUE- punching/reaching forward/diagonals. Patient was unable to lift LE or kick out today due to weakness but did perform some postural strengthening- Scap rows x 20 and then head forward flex into ext x 20 reps each. Patient request to stop approx 30 min of sitting citing just overall fatigue. He was dependently positioned from mat back to power wheelchair.   Education provided throughout session via VC/TC and demonstration to facilitate movement at target joints and correct muscle activation for all testing and exercises performed.       PATIENT EDUCATION: Education details: posture cues with sitting Person educated: Patient Education method: Explanation, Demonstration, Tactile cues, and Verbal cues Education comprehension: verbalized understanding, returned demonstration, verbal cues required, tactile cues required, and needs further education   HOME EXERCISE PROGRAM:    PT Short Term Goals -       PT SHORT TERM GOAL #1   Title Pt will be independent with HEP in order to improve strength and balance in order to decrease fall risk and improve function at home and work.    Baseline 01/04/2021= No formal HEP in place; 12/12 no HEP in place; 05/10/2021-Patient and his father were able to report compliance with curent HEP consisting of mostly seated/reclined LE strengthening. Both verbalize no questions at this time.    Time 6    Period Weeks    Status Achieved    Target Date 02/15/21              PT Long Term Goals -      PT LONG TERM GOAL #1   Title Patient will increase BLE gross strength by 1/2 muscle grade to improve functional strength for improved independence with potential gait, increased standing tolerance and increased ADL ability.    Baseline 01/04/2021- Patient presents with 1/5 to 3-/5 B LE strength with MMT; 12/12: goal partially met for Left knee/hip; 05/10/2021= 2-/5 bilateal Hip flex; 3+/5 bilateral Knee ext; 06/21/2021= Patient presents with 2-/5  bilateral Hip flex; 3+/5 bilateral knee ext/flex; 2-/5 left ankle DF; 0/5 right ankle- and able to increase reps and resistance with LE's. 09/15/2021- Patient technically presents with 2-/5 B hip flex/abd/add - but he is able to raise his hip up to approx 100 deg which has improved. 3+/5 Bilateral knee ext, 2-/5 left ankle and 0/5 right ankle. 12/08/2021= Patient able to lift left knee at 110 deg of hip flex; presents with 3+/5 knee ext, 2-/5 left ankle DF and 0/5 right ankle DF, 2-/5 bilateral Hip abd in seated position.    Time 12    Period Weeks    Status On-going    Target Date 03/07/2022     PT LONG TERM GOAL #2   Title Patient will tolerate sitting unsupported demonstrating erect sitting posture for 15 minutes with CGA to demonstrate improved back extensor strength and improved sitting tolerance.    Baseline 01/04/2021- Patient confied to sitting in lift chair or electric power chair with back support and unable to sit upright without physical assistance; 12/12: tolerates <  1 minutes upright unsupported sitting. 05/10/2021=static sit with forward trunk lean  in his power wheelchair without back support x approx 3 min. 06/21/2021=Unable to assess today due to patient with acute back pain but on previous visit able to sit x 8 min without back support. 09/15/2021- on last visit- 09/13/2021- patient was able to sit unsupported x 8 min at edge of mat. 10/13/2023 - Patient was able to sit at edge of mat with varying level of assist today from SBA to min A for a total of 20 min. 12/13/2021= Patient demonstrated unsupported sitting at edge of mat for approx 20 min   Time 12    Period Weeks    Status GOAL MET   Target Date 12/08/21      PT LONG TERM GOAL #3   Title Patient will demonstrate ability to perform static standing in // bars > 2 min with Max Assist  without loss of balance and fair posture for improved overall strength for pre-gait and transfer activities.    Baseline 01/04/2021= Patient current uanble to  stand- Dependent on hoyer or sit to stand lift for transfers. 05/10/2021=Not appropriate yet- Currently still dependent with all transfers using hoyer. 06/21/2021= Patient continuing now to focus on LE strengthening to prepare for standing-unable to try today due to acute low back pain-  planning on attempting in new cert period. 09/15/2021- Patient has attempted standing 2x in past two week- max Assist of 2 people - only once was he successful to clearing his bottom from chair - Will continue to be a focus during the new certification. 12/13/2021= Patient has been limited secondary to increased overall low back pain during this certification and will require more time to focus on this goal.    Time 12    Period Weeks    Status On-going    Target Date 03/07/2022     PT LONG TERM GOAL #4   Title Pt will improve FOTO score by 10 points or more demonstrating improved perceived functional ability    Baseline FOTO 7 on 10/17; 03/15/21: FOTO 12; 05/10/2021 06/21/2021= 1; 09/15/2021= 9; 12/13/2021= Will issue next visit    Time 12    Period Weeks    Status On-going    Target Date 03/07/2022     PT LONG TERM GOAL #5   Title Patient will perform sit to stand transfer with appropriate AD and max assist of 2 people with 75% consistency to prepare for pregait activities.    Baseline 09/15/2021= Patient unable to stand well- unable to clear his bottom off chair with Max assist of 2 persons. 12/13/2021- Goal not appropriate to try yet but will keep and roll over to next cert as shift continues to focus on transfers/standing   Time 12    Period Weeks    Status New    Target Date 03/07/2022   PT LONG TERM GOAL #6  Title Patient will tolerate sitting unsupported demonstrating erect sitting posture for 30 minutes with CGA to demonstrate improved back extensor strength and improved sitting tolerance.   Baseline 12/13/2021= Patient demonstrated unsupported sitting at edge of mat for approx 20 min  Time 12   Period Weeks    Status New  Target Date 03/07/2022            Plan     Clinical Impression Statement Patient presented with some reported back/core soreness yet agreeable to sitting at edge of mat and working on sitting balance and core/UE/LE strength. He was able  to sit up well without any significant difficulty for longer period of time but ultimately stopped due to fatigue. Patient would benefit from additional skilled PT intervention to improve strength, balance, and mobility for improved quality of life.    Personal Factors and Comorbidities Comorbidity 3+;Time since onset of injury/illness/exacerbation    Comorbidities CVA, diabetes, Seizures    Examination-Activity Limitations Bathing;Bed Mobility;Bend;Caring for Others;Carry;Dressing;Hygiene/Grooming;Lift;Locomotion Level;Reach Overhead;Self Feeding;Sit;Squat;Stairs;Stand;Transfers;Toileting    Examination-Participation Restrictions Cleaning;Community Activity;Driving;Laundry;Medication Management;Meal Prep;Occupation;Personal Finances;Shop;Yard Work;Volunteer    Stability/Clinical Decision Making Evolving/Moderate complexity    Rehab Potential Fair    PT Frequency 2x / week    PT Duration 12 weeks    PT Treatment/Interventions ADLs/Self Care Home Management;Cryotherapy;Electrical Stimulation;Moist Heat;Ultrasound;DME Instruction;Gait training;Stair training;Functional mobility training;Therapeutic exercise;Balance training;Patient/family education;Orthotic Fit/Training;Neuromuscular re-education;Wheelchair mobility training;Manual techniques;Passive range of motion;Dry needling;Energy conservation;Taping;Visual/perceptual remediation/compensation;Joint Manipulations    PT Next Visit Plan core strength- sitting at EOM, postural control, sabina sit to stand if able; Continue with progressive LE Strengthening    PT Home Exercise Plan No changes to HEP today    Consulted and Agree with Plan of Care Patient;Family member/caregiver    Family Member  Consulted Dad               Lewis Moccasin, PT 12/16/2021, 2:56 PM

## 2021-12-20 ENCOUNTER — Ambulatory Visit: Payer: BC Managed Care – PPO

## 2021-12-20 DIAGNOSIS — L8994 Pressure ulcer of unspecified site, stage 4: Secondary | ICD-10-CM | POA: Diagnosis not present

## 2021-12-20 DIAGNOSIS — R32 Unspecified urinary incontinence: Secondary | ICD-10-CM | POA: Diagnosis not present

## 2021-12-20 DIAGNOSIS — I6389 Other cerebral infarction: Secondary | ICD-10-CM | POA: Diagnosis not present

## 2021-12-21 DIAGNOSIS — G4733 Obstructive sleep apnea (adult) (pediatric): Secondary | ICD-10-CM | POA: Diagnosis not present

## 2021-12-22 ENCOUNTER — Ambulatory Visit: Payer: BC Managed Care – PPO

## 2021-12-22 DIAGNOSIS — M6281 Muscle weakness (generalized): Secondary | ICD-10-CM | POA: Diagnosis not present

## 2021-12-22 DIAGNOSIS — R269 Unspecified abnormalities of gait and mobility: Secondary | ICD-10-CM

## 2021-12-22 DIAGNOSIS — I6930 Unspecified sequelae of cerebral infarction: Secondary | ICD-10-CM | POA: Diagnosis not present

## 2021-12-22 DIAGNOSIS — R262 Difficulty in walking, not elsewhere classified: Secondary | ICD-10-CM

## 2021-12-22 DIAGNOSIS — R278 Other lack of coordination: Secondary | ICD-10-CM

## 2021-12-22 DIAGNOSIS — R2681 Unsteadiness on feet: Secondary | ICD-10-CM | POA: Diagnosis not present

## 2021-12-22 DIAGNOSIS — I639 Cerebral infarction, unspecified: Secondary | ICD-10-CM | POA: Diagnosis not present

## 2021-12-22 DIAGNOSIS — R2689 Other abnormalities of gait and mobility: Secondary | ICD-10-CM

## 2021-12-22 DIAGNOSIS — R41841 Cognitive communication deficit: Secondary | ICD-10-CM | POA: Diagnosis not present

## 2021-12-23 NOTE — Therapy (Signed)
OUTPATIENT PHYSICAL THERAPY TREATMENT NOTE   Patient Name: Paul Hall MRN: [id number] DOB:12-10-1995, 26 y.o., male Today's Date: 12/23/2021  PCP:  Loistine Chance PROVIDER:  Collene Mares, PA-C   PT End of Session - 12/22/21 1514     Visit Number 59    Number of Visits 7    Date for PT Re-Evaluation 03/07/22    Authorization Type [employer]BS and [employer] Medicaid- Need auth past 12th visit    Authorization Time Period 01/04/21-03/29/21; Recert 40/98/1191-4/78/2956; Recert 05/18/863- 10/10/4694    Progress Note Due on Visit 49    PT Start Time 1514    PT Stop Time 1556    PT Time Calculation (min) 42 min    Equipment Utilized During Treatment Gait belt   Hoyer lift   Activity Tolerance Patient tolerated treatment well   queasiness/nausea   Behavior During Therapy WFL for tasks assessed/performed              Past Medical History:  Diagnosis Date   Diabetes mellitus ([place])    Hypertension    Stroke [name]    Past Surgical History:  Procedure Laterality Date   IVC FILTER PLACEMENT ([place] HX)     LEG SURGERY     PEG TUBE PLACEMENT     TRACHEOSTOMY     Patient Active Problem List   Diagnosis Date Noted   Sepsis due to vancomycin resistant Enterococcus species ([place]) 06/06/2019   SIRS (systemic inflammatory response syndrome) ([place]) 06/05/2019   Acute lower UTI 06/05/2019   VRE (vancomycin-resistant Enterococci) infection 06/05/2019   Anemia 06/05/2019   Skin ulcer of sacrum with necrosis of muscle ([place])    Urinary retention    Type 2 diabetes mellitus without complication, with long-term current use of insulin (HCC)    Tachycardia    Lower extremity edema    Acute metabolic encephalopathy    Obstructive sleep apnea    Morbid obesity with BMI of 60.0-69.9, adult ([place])    Goals of care, counseling/discussion    Palliative care encounter    Sepsis ([place]) 04/27/2019   H/O insulin dependent diabetes mellitus 04/27/2019   History of CVA with residual deficit 04/27/2019    Seizure disorder ([place]) 04/27/2019   Decubitus ulcer of sacral region, stage 4 ([place]) 04/27/2019    REFERRING DIAG: Cerebral infarction, unspecified   THERAPY DIAG:  Muscle weakness (generalized)  Other lack of coordination  History of CVA with residual deficit  Difficulty in walking, not elsewhere classified  Unsteadiness on feet  Abnormality of gait and mobility  Other abnormalities of gait and mobility  Cognitive communication deficit  Rationale for Evaluation and Treatment Rehabilitation  PERTINENT HISTORY: Paul Hall is a 25yoM who presents with severe weakness, quadriparesis, altered sensorium, and visual impairment s/p critical illness and prolonged hospitalization. Pt hospitalized in October 2020 with ARDS 2/2 COVID19 infection. Pt sustained a complex and lengthy hospitalization which included tracheostomy, prolonged sedation, ECMO. In this period pt sustained CVA and SDH. Pt has now been liberated from tracheostomy and G-tube. Pt has since been hospitalized for wound infection and UTI. Pt lives with parents at home, has hospital bed and left chair, hoyer lift transfers, and power WC for mobility needs. Pt needs heavy physical assistance with ADL 2/2 BUE contractures and motor dysfunction   PRECAUTIONS: Fall  SUBJECTIVE: Patient reports he just came from appointment to have his wrist splinted and that the profession assisting made his arm very sore- bending it too quickly. He declined to transfer over to mate  citing pain  in right wrist.    PAIN:  Are you having pain? Yes- right wrist- did not rate     TODAY'S TREATMENT:   Therapeutic Exercises:  PROM- B ankles- DF/PF x 20 sec each x 3 PROM- Hamstring stretch BLE x 30 sec x 3 each LE PROM- Hip ER/IR BLE x 30 sec x 3 each LE Hip circles x 20 reps CW then 20 more CCW Each LE Hip abduction stretch - PT pushing knees together x 30 sec x 3 each LE Calf press against footplate x 15 reps each. Seated hip march 2 x  15 reps each LE (AAROM)  Seated Knee ext 2 x15 reps each LE (AROM)   Seated hip abd  with manual resistance x 15 reps each LE Seated Hip add  with manual resistance x15 reps each LE.    Education provided throughout session via VC/TC and demonstration to facilitate movement at target joints and correct muscle activation for all testing and exercises performed.       PATIENT EDUCATION: Education details: posture cues with sitting Person educated: Patient Education method: Explanation, Demonstration, Tactile cues, and Verbal cues Education comprehension: verbalized understanding, returned demonstration, verbal cues required, tactile cues required, and needs further education   HOME EXERCISE PROGRAM:    PT Short Term Goals -       PT SHORT TERM GOAL #1   Title Pt will be independent with HEP in order to improve strength and balance in order to decrease fall risk and improve function at home and work.    Baseline 01/04/2021= No formal HEP in place; 12/12 no HEP in place; 05/10/2021-Patient and his father were able to report compliance with curent HEP consisting of mostly seated/reclined LE strengthening. Both verbalize no questions at this time.    Time 6    Period Weeks    Status Achieved    Target Date 02/15/21              PT Long Term Goals -      PT LONG TERM GOAL #1   Title Patient will increase BLE gross strength by 1/2 muscle grade to improve functional strength for improved independence with potential gait, increased standing tolerance and increased ADL ability.    Baseline 01/04/2021- Patient presents with 1/5 to 3-/5 B LE strength with MMT; 12/12: goal partially met for Left knee/hip; 05/10/2021= 2-/5 bilateal Hip flex; 3+/5 bilateral Knee ext; 06/21/2021= Patient presents with 2-/5 bilateral Hip flex; 3+/5 bilateral knee ext/flex; 2-/5 left ankle DF; 0/5 right ankle- and able to increase reps and resistance with LE's. 09/15/2021- Patient technically presents with 2-/5 B hip  flex/abd/add - but he is able to raise his hip up to approx 100 deg which has improved. 3+/5 Bilateral knee ext, 2-/5 left ankle and 0/5 right ankle. 12/08/2021= Patient able to lift left knee at 110 deg of hip flex; presents with 3+/5 knee ext, 2-/5 left ankle DF and 0/5 right ankle DF, 2-/5 bilateral Hip abd in seated position.    Time 12    Period Weeks    Status On-going    Target Date 03/07/2022     PT LONG TERM GOAL #2   Title Patient will tolerate sitting unsupported demonstrating erect sitting posture for 15 minutes with CGA to demonstrate improved back extensor strength and improved sitting tolerance.    Baseline 01/04/2021- Patient confied to sitting in lift chair or electric power chair with back support and unable to sit upright without  physical assistance; 12/12: tolerates <1 minutes upright unsupported sitting. 05/10/2021=static sit with forward trunk lean  in his power wheelchair without back support x approx 3 min. 06/21/2021=Unable to assess today due to patient with acute back pain but on previous visit able to sit x 8 min without back support. 09/15/2021- on last visit- 09/13/2021- patient was able to sit unsupported x 8 min at edge of mat. 10/13/2023 - Patient was able to sit at edge of mat with varying level of assist today from SBA to min A for a total of 20 min. 12/13/2021= Patient demonstrated unsupported sitting at edge of mat for approx 20 min   Time 12    Period Weeks    Status GOAL MET   Target Date 12/08/21      PT LONG TERM GOAL #3   Title Patient will demonstrate ability to perform static standing in // bars > 2 min with Max Assist  without loss of balance and fair posture for improved overall strength for pre-gait and transfer activities.    Baseline 01/04/2021= Patient current uanble to stand- Dependent on hoyer or sit to stand lift for transfers. 05/10/2021=Not appropriate yet- Currently still dependent with all transfers using hoyer. 06/21/2021= Patient continuing now to focus on  LE strengthening to prepare for standing-unable to try today due to acute low back pain-  planning on attempting in new cert period. 09/15/2021- Patient has attempted standing 2x in past two week- max Assist of 2 people - only once was he successful to clearing his bottom from chair - Will continue to be a focus during the new certification. 12/13/2021= Patient has been limited secondary to increased overall low back pain during this certification and will require more time to focus on this goal.    Time 12    Period Weeks    Status On-going    Target Date 03/07/2022     PT LONG TERM GOAL #4   Title Pt will improve FOTO score by 10 points or more demonstrating improved perceived functional ability    Baseline FOTO 7 on 10/17; 03/15/21: FOTO 12; 05/10/2021 06/21/2021= 1; 09/15/2021= 9; 12/13/2021= Will issue next visit    Time 12    Period Weeks    Status On-going    Target Date 03/07/2022     PT LONG TERM GOAL #5   Title Patient will perform sit to stand transfer with appropriate AD and max assist of 2 people with 75% consistency to prepare for pregait activities.    Baseline 09/15/2021= Patient unable to stand well- unable to clear his bottom off chair with Max assist of 2 persons. 12/13/2021- Goal not appropriate to try yet but will keep and roll over to next cert as shift continues to focus on transfers/standing   Time 12    Period Weeks    Status New    Target Date 03/07/2022   PT LONG TERM GOAL #6  Title Patient will tolerate sitting unsupported demonstrating erect sitting posture for 30 minutes with CGA to demonstrate improved back extensor strength and improved sitting tolerance.   Baseline 12/13/2021= Patient demonstrated unsupported sitting at edge of mat for approx 20 min  Time 12   Period Weeks   Status New  Target Date 03/07/2022            Plan     Clinical Impression Statement Treatment limited to seated therex today - patient reported some acute right wrist soreness after being  evaluated for B wrist splints prior  to visit. He did exhibit more AROM right LE including marching and full ROM with right knee ext today.   Patient would benefit from additional skilled PT intervention to improve strength, balance, and mobility for improved quality of life.    Personal Factors and Comorbidities Comorbidity 3+;Time since onset of injury/illness/exacerbation    Comorbidities CVA, diabetes, Seizures    Examination-Activity Limitations Bathing;Bed Mobility;Bend;Caring for Others;Carry;Dressing;Hygiene/Grooming;Lift;Locomotion Level;Reach Overhead;Self Feeding;Sit;Squat;Stairs;Stand;Transfers;Toileting    Examination-Participation Restrictions Cleaning;Community Activity;Driving;Laundry;Medication Management;Meal Prep;Occupation;Personal Finances;Shop;Yard Work;Volunteer    Stability/Clinical Decision Making Evolving/Moderate complexity    Rehab Potential Fair    PT Frequency 2x / week    PT Duration 12 weeks    PT Treatment/Interventions ADLs/Self Care Home Management;Cryotherapy;Electrical Stimulation;Moist Heat;Ultrasound;DME Instruction;Gait training;Stair training;Functional mobility training;Therapeutic exercise;Balance training;Patient/family education;Orthotic Fit/Training;Neuromuscular re-education;Wheelchair mobility training;Manual techniques;Passive range of motion;Dry needling;Energy conservation;Taping;Visual/perceptual remediation/compensation;Joint Manipulations    PT Next Visit Plan core strength- sitting at EOM, postural control, sabina sit to stand if able; Continue with progressive LE Strengthening    PT Home Exercise Plan No changes to HEP today    Consulted and Agree with Plan of Care Patient;Family member/caregiver    Family Member Consulted Dad               Lewis Moccasin, PT 12/23/2021, 3:01 PM

## 2021-12-27 ENCOUNTER — Ambulatory Visit: Payer: BC Managed Care – PPO

## 2021-12-29 ENCOUNTER — Ambulatory Visit: Payer: BC Managed Care – PPO

## 2021-12-29 DIAGNOSIS — R278 Other lack of coordination: Secondary | ICD-10-CM | POA: Diagnosis not present

## 2021-12-29 DIAGNOSIS — R269 Unspecified abnormalities of gait and mobility: Secondary | ICD-10-CM

## 2021-12-29 DIAGNOSIS — R2689 Other abnormalities of gait and mobility: Secondary | ICD-10-CM

## 2021-12-29 DIAGNOSIS — I6930 Unspecified sequelae of cerebral infarction: Secondary | ICD-10-CM

## 2021-12-29 DIAGNOSIS — R262 Difficulty in walking, not elsewhere classified: Secondary | ICD-10-CM

## 2021-12-29 DIAGNOSIS — R41841 Cognitive communication deficit: Secondary | ICD-10-CM | POA: Diagnosis not present

## 2021-12-29 DIAGNOSIS — M6281 Muscle weakness (generalized): Secondary | ICD-10-CM | POA: Diagnosis not present

## 2021-12-29 DIAGNOSIS — R2681 Unsteadiness on feet: Secondary | ICD-10-CM | POA: Diagnosis not present

## 2021-12-30 NOTE — Therapy (Signed)
OUTPATIENT PHYSICAL THERAPY TREATMENT NOTE/Physical Therapy Progress Note   Dates of reporting period  10/12/2021   to   12/29/2021   Patient Name: Paul Hall MRN: [id number] DOB:Sep 27, 1995, 26 y.o., male Today's Date: 12/30/2021  PCP:  Paul Hall PROVIDER:  Collene Mares, PA-C   PT End of Session - 12/29/21 1606     Visit Number 60    Number of Visits 19    Date for PT Re-Evaluation 03/07/22    Authorization Type [employer] and [employer]- Need auth past 12th visit    Authorization Time Period 01/04/21-03/29/21; Recert 04/59/9774-1/42/3953; Recert 05/06/3341- 08/08/8614    Progress Note Due on Visit 60    PT Start Time 1516    PT Stop Time 1558    PT Time Calculation (min) 42 min    Equipment Utilized During Treatment Gait belt   Hoyer lift   Activity Tolerance Patient tolerated treatment well   queasiness/nausea   Behavior During Therapy WFL for tasks assessed/performed              Past Medical History:  Diagnosis Date   Diabetes mellitus ([place])    Hypertension    Stroke [facility])    Past Surgical History:  Procedure Laterality Date   IVC FILTER PLACEMENT ([place] HX)     LEG SURGERY     PEG TUBE PLACEMENT     TRACHEOSTOMY     Patient Active Problem List   Diagnosis Date Noted   Sepsis due to vancomycin resistant Enterococcus species ([place]) 06/06/2019   SIRS (systemic inflammatory response syndrome) ([place]) 06/05/2019   Acute lower UTI 06/05/2019   VRE (vancomycin-resistant Enterococci) infection 06/05/2019   Anemia 06/05/2019   Skin ulcer of sacrum with necrosis of muscle ([place])    Urinary retention    Type 2 diabetes mellitus without complication, with long-term current use of insulin (HCC)    Tachycardia    Lower extremity edema    Acute metabolic encephalopathy    Obstructive sleep apnea    Morbid obesity with BMI of 60.0-69.9, adult ([place])    Goals of care, counseling/discussion    Palliative care encounter    Sepsis ([place]) 04/27/2019   H/O insulin  dependent diabetes mellitus 04/27/2019   History of CVA with residual deficit 04/27/2019   Seizure disorder ([place]) 04/27/2019   Decubitus ulcer of sacral region, stage 4 ([place]) 04/27/2019    REFERRING DIAG: Cerebral infarction, unspecified   THERAPY DIAG:  Other lack of coordination  Muscle weakness (generalized)  History of CVA with residual deficit  Difficulty in walking, not elsewhere classified  Unsteadiness on feet  Abnormality of gait and mobility  Other abnormalities of gait and mobility  Cognitive communication deficit  Rationale for Evaluation and Treatment Rehabilitation  PERTINENT HISTORY: Paul Hall is a 26yoM who presents with severe weakness, quadriparesis, altered sensorium, and visual impairment s/p critical illness and prolonged hospitalization. Pt hospitalized in October 2020 with ARDS 2/2 COVID19 infection. Pt sustained a complex and lengthy hospitalization which included tracheostomy, prolonged sedation, ECMO. In this period pt sustained CVA and SDH. Pt has now been liberated from tracheostomy and G-tube. Pt has since been hospitalized for wound infection and UTI. Pt lives with parents at home, has hospital bed and left chair, hoyer lift transfers, and power WC for mobility needs. Pt needs heavy physical assistance with ADL 2/2 BUE contractures and motor dysfunction   PRECAUTIONS: Fall  SUBJECTIVE: Patient reports just feeling depressed this week. His Dad reports that they tried some weight bearing  activities and [name] didn't think it went well.    PAIN:  Are you having pain? No   TODAY'S TREATMENT:    Please refer to goal section for reassessment of goals for progress note #60  Patient was hoyer lifted from power wheelchair to mat table and positioned along side of mat with feet positioned on 6" block and back supported by wedge and 2 pillows. After successful transfer - he was assisted from reclined to upright sitting with mod Assist. From there he  performed static sitting for a couple of min. Then he progressed to the following:   Lateral weight shifting - each x 15 reps ( increased difficulty shifting to right side)  A/P weight shifting - Each x 15 reps  Thoracic trunk rotation - 15 reps each direction Seated LAQ B LE ( in available ROM on right ) x 15 reps  (with no back support)  Seated pick up leg and place on top of 6" block - assist on right LE x 10 reps and Active on left x 10 reps.   Patient reported fatigue and positioned back down into reclined position and hoyer back to power w/c.   Education provided throughout session via VC/TC and demonstration to facilitate movement at target joints and correct muscle activation for all testing and exercises performed.       PATIENT EDUCATION: Education details: posture cues with sitting Person educated: Patient Education method: Explanation, Demonstration, Tactile cues, and Verbal cues Education comprehension: verbalized understanding, returned demonstration, verbal cues required, tactile cues required, and needs further education   HOME EXERCISE PROGRAM:    PT Short Term Goals -       PT SHORT TERM GOAL #1   Title Pt will be independent with HEP in order to improve strength and balance in order to decrease fall risk and improve function at home and work.    Baseline 01/04/2021= No formal HEP in place; 12/12 no HEP in place; 05/10/2021-Patient and his father were able to report compliance with curent HEP consisting of mostly seated/reclined LE strengthening. Both verbalize no questions at this time.    Time 6    Period Weeks    Status Achieved    Target Date 02/15/21              PT Long Term Goals -      PT LONG TERM GOAL #1   Title Patient will increase BLE gross strength by 1/2 muscle grade to improve functional strength for improved independence with potential gait, increased standing tolerance and increased ADL ability.    Baseline 01/04/2021- Patient presents  with 1/5 to 3-/5 B LE strength with MMT; 12/12: goal partially met for Left knee/hip; 05/10/2021= 2-/5 bilateal Hip flex; 3+/5 bilateral Knee ext; 06/21/2021= Patient presents with 2-/5 bilateral Hip flex; 3+/5 bilateral knee ext/flex; 2-/5 left ankle DF; 0/5 right ankle- and able to increase reps and resistance with LE's. 09/15/2021- Patient technically presents with 2-/5 B hip flex/abd/add - but he is able to raise his hip up to approx 100 deg which has improved. 3+/5 Bilateral knee ext, 2-/5 left ankle and 0/5 right ankle. 12/08/2021= Patient able to lift left knee at 110 deg of hip flex; presents with 3+/5 knee ext, 2-/5 left ankle DF and 0/5 right ankle DF, 2-/5 bilateral Hip abd in seated position.    Time 12    Period Weeks    Status On-going    Target Date 03/07/2022     PT LONG  TERM GOAL #2   Title Patient will tolerate sitting unsupported demonstrating erect sitting posture for 15 minutes with CGA to demonstrate improved back extensor strength and improved sitting tolerance.    Baseline 01/04/2021- Patient confied to sitting in lift chair or electric power chair with back support and unable to sit upright without physical assistance; 12/12: tolerates <1 minutes upright unsupported sitting. 05/10/2021=static sit with forward trunk lean  in his power wheelchair without back support x approx 3 min. 06/21/2021=Unable to assess today due to patient with acute back pain but on previous visit able to sit x 8 min without back support. 09/15/2021- on last visit- 09/13/2021- patient was able to sit unsupported x 8 min at edge of mat. 10/13/2023 - Patient was able to sit at edge of mat with varying level of assist today from SBA to min A for a total of 20 min. 12/13/2021= Patient demonstrated unsupported sitting at edge of mat for approx 20 min   Time 12    Period Weeks    Status GOAL MET   Target Date 12/08/21      PT LONG TERM GOAL #3   Title Patient will demonstrate ability to perform static standing in // bars >  2 min with Max Assist  without loss of balance and fair posture for improved overall strength for pre-gait and transfer activities.    Baseline 01/04/2021= Patient current uanble to stand- Dependent on hoyer or sit to stand lift for transfers. 05/10/2021=Not appropriate yet- Currently still dependent with all transfers using hoyer. 06/21/2021= Patient continuing now to focus on LE strengthening to prepare for standing-unable to try today due to acute low back pain-  planning on attempting in new cert period. 09/15/2021- Patient has attempted standing 2x in past two week- max Assist of 2 people - only once was he successful to clearing his bottom from chair - Will continue to be a focus during the new certification. 12/13/2021= Patient has been limited secondary to increased overall low back pain during this certification and will require more time to focus on this goal.    Time 12    Period Weeks    Status On-going    Target Date 03/07/2022     PT LONG TERM GOAL #4   Title Pt will improve FOTO score by 10 points or more demonstrating improved perceived functional ability    Baseline FOTO 7 on 10/17; 03/15/21: FOTO 12; 05/10/2021 06/21/2021= 1; 09/15/2021= 9; 12/13/2021= Will issue next visit    Time 12    Period Weeks    Status On-going    Target Date 03/07/2022     PT LONG TERM GOAL #5   Title Patient will perform sit to stand transfer with appropriate AD and max assist of 2 people with 75% consistency to prepare for pregait activities.    Baseline 09/15/2021= Patient unable to stand well- unable to clear his bottom off chair with Max assist of 2 persons. 12/13/2021- Goal not appropriate to try yet but will keep and roll over to next cert as shift continues to focus on transfers/standing   Time 12    Period Weeks    Status New    Target Date 03/07/2022   PT LONG TERM GOAL #6  Title Patient will tolerate sitting unsupported demonstrating erect sitting posture for 30 minutes with CGA to demonstrate improved  back extensor strength and improved sitting tolerance.   Baseline 12/13/2021= Patient demonstrated unsupported sitting at edge of mat for approx 20 min;  12/29/2021- Patient performed approx 30 min of dynamic sitting activities today.   Time 12   Period Weeks   Status ONGOING  Target Date 03/07/2022            Plan     Clinical Impression Statement Patient performed well overall today- able to sit approx 30 min including some UE and LE dynamic activities without any loss of balance in seated position. Patient's condition has the potential to improve in response to therapy. Maximum improvement is yet to be obtained. The anticipated improvement is attainable and reasonable in a generally predictable time.  Patient reports   Patient would benefit from additional skilled PT intervention to improve strength, balance, and mobility for improved quality of life.    Personal Factors and Comorbidities Comorbidity 3+;Time since onset of injury/illness/exacerbation    Comorbidities CVA, diabetes, Seizures    Examination-Activity Limitations Bathing;Bed Mobility;Bend;Caring for Others;Carry;Dressing;Hygiene/Grooming;Lift;Locomotion Level;Reach Overhead;Self Feeding;Sit;Squat;Stairs;Stand;Transfers;Toileting    Examination-Participation Restrictions Cleaning;Community Activity;Driving;Laundry;Medication Management;Meal Prep;Occupation;Personal Finances;Shop;Yard Work;Volunteer    Stability/Clinical Decision Making Evolving/Moderate complexity    Rehab Potential Fair    PT Frequency 2x / week    PT Duration 12 weeks    PT Treatment/Interventions ADLs/Self Care Home Management;Cryotherapy;Electrical Stimulation;Moist Heat;Ultrasound;DME Instruction;Gait training;Stair training;Functional mobility training;Therapeutic exercise;Balance training;Patient/family education;Orthotic Fit/Training;Neuromuscular re-education;Wheelchair mobility training;Manual techniques;Passive range of motion;Dry needling;Energy  conservation;Taping;Visual/perceptual remediation/compensation;Joint Manipulations    PT Next Visit Plan core strength- sitting at EOM, postural control, sabina sit to stand if able; Continue with progressive LE Strengthening    PT Home Exercise Plan No changes to HEP today    Consulted and Agree with Plan of Care Patient;Family member/caregiver    Family Member Consulted Dad               Lewis Moccasin, PT 12/30/2021, 5:05 PM

## 2022-01-03 ENCOUNTER — Ambulatory Visit: Payer: BC Managed Care – PPO

## 2022-01-03 ENCOUNTER — Ambulatory Visit: Payer: BC Managed Care – PPO | Attending: Physician Assistant

## 2022-01-03 DIAGNOSIS — I6930 Unspecified sequelae of cerebral infarction: Secondary | ICD-10-CM | POA: Insufficient documentation

## 2022-01-03 DIAGNOSIS — R269 Unspecified abnormalities of gait and mobility: Secondary | ICD-10-CM | POA: Diagnosis not present

## 2022-01-03 DIAGNOSIS — M6281 Muscle weakness (generalized): Secondary | ICD-10-CM | POA: Insufficient documentation

## 2022-01-03 DIAGNOSIS — R2689 Other abnormalities of gait and mobility: Secondary | ICD-10-CM | POA: Diagnosis not present

## 2022-01-03 DIAGNOSIS — R278 Other lack of coordination: Secondary | ICD-10-CM | POA: Insufficient documentation

## 2022-01-03 DIAGNOSIS — R2681 Unsteadiness on feet: Secondary | ICD-10-CM | POA: Insufficient documentation

## 2022-01-03 DIAGNOSIS — R41841 Cognitive communication deficit: Secondary | ICD-10-CM | POA: Diagnosis not present

## 2022-01-03 DIAGNOSIS — R262 Difficulty in walking, not elsewhere classified: Secondary | ICD-10-CM | POA: Diagnosis not present

## 2022-01-03 NOTE — Therapy (Addendum)
OUTPATIENT PHYSICAL THERAPY TREATMENT NOTE  Patient Name: Paul Hall MRN: [id number] DOB:1995-08-25, 26 y.o., male Today's Date: 01/04/2022  PCP:  Loistine Chance PROVIDER:  Collene Mares, PA-C   PT End of Session - 01/03/22 1513     Visit Number 61    Number of Visits 54    Date for PT Re-Evaluation 03/07/22    Authorization Type [employer] and [employer] Medicaid- Need auth past 12th visit    Authorization Time Period 01/04/21-03/29/21; Recert 62/26/3335-4/56/2563; Recert 8/93/7342- 11/08/6809    Progress Note Due on Visit 110    PT Start Time 1515    PT Stop Time 1558    PT Time Calculation (min) 43 min    Equipment Utilized During Treatment Gait belt   Hoyer lift   Activity Tolerance Patient tolerated treatment well   queasiness/nausea   Behavior During Therapy WFL for tasks assessed/performed               Past Medical History:  Diagnosis Date   Diabetes mellitus ([place])    Hypertension    Stroke [facility])    Past Surgical History:  Procedure Laterality Date   IVC FILTER PLACEMENT ([place] HX)     LEG SURGERY     PEG TUBE PLACEMENT     TRACHEOSTOMY     Patient Active Problem List   Diagnosis Date Noted   Sepsis due to vancomycin resistant Enterococcus species ([place]) 06/06/2019   SIRS (systemic inflammatory response syndrome) ([place]) 06/05/2019   Acute lower UTI 06/05/2019   VRE (vancomycin-resistant Enterococci) infection 06/05/2019   Anemia 06/05/2019   Skin ulcer of sacrum with necrosis of muscle ([place])    Urinary retention    Type 2 diabetes mellitus without complication, with long-term current use of insulin (HCC)    Tachycardia    Lower extremity edema    Acute metabolic encephalopathy    Obstructive sleep apnea    Morbid obesity with BMI of 60.0-69.9, adult ([place])    Goals of care, counseling/discussion    Palliative care encounter    Sepsis ([place]) 04/27/2019   H/O insulin dependent diabetes mellitus 04/27/2019   History of CVA with residual deficit 04/27/2019    Seizure disorder ([place]) 04/27/2019   Decubitus ulcer of sacral region, stage 4 ([place]) 04/27/2019    REFERRING DIAG: Cerebral infarction, unspecified   THERAPY DIAG:  Muscle weakness (generalized)  Other lack of coordination  History of CVA with residual deficit  Difficulty in walking, not elsewhere classified  Unsteadiness on feet  Abnormality of gait and mobility  Other abnormalities of gait and mobility  Cognitive communication deficit  Rationale for Evaluation and Treatment Rehabilitation  PERTINENT HISTORY: Paul Hall is a 25yoM who presents with severe weakness, quadriparesis, altered sensorium, and visual impairment s/p critical illness and prolonged hospitalization. Pt hospitalized in October 2020 with ARDS 2/2 COVID19 infection. Pt sustained a complex and lengthy hospitalization which included tracheostomy, prolonged sedation, ECMO. In this period pt sustained CVA and SDH. Pt has now been liberated from tracheostomy and G-tube. Pt has since been hospitalized for wound infection and UTI. Pt lives with parents at home, has hospital bed and left chair, hoyer lift transfers, and power WC for mobility needs. Pt needs heavy physical assistance with ADL 2/2 BUE contractures and motor dysfunction   PRECAUTIONS: Fall  SUBJECTIVE:  Patient reports being very sore as he and his parents attempted standing last night and states today he is very sore in his legs.   PAIN:  Are you having pain?  No   TODAY'S TREATMENT:   Patient requested to only perform seated therex today secondary to not feeling well- "overdid it standing with parents at home yesterday using lift/chair."   Seated LE stretching- Hamstring/calf in longsitting position-hold 60 sec x 4 each LE (Patient reported being very sore)  Quad sets- 3 sec hold x 15 reps total each LE (VC to hold for 3 sec) Heel slides- AAROM 2 sets of 15 reps.  LAQ- BLE - AROM- 2 sets of 15 reps.  Hip add squeeze with soft red ball- VC  to hold 3 sec x 15 reps total Manual resistive hip abd- Hold 3 sec x 15 reps Hip flex (AAROM) BLE 2 sets of 15 reps.        Education provided throughout session via VC/TC and demonstration to facilitate movement at target joints and correct muscle activation for all testing and exercises performed.       PATIENT EDUCATION: Education details: posture cues with sitting Person educated: Patient Education method: Explanation, Demonstration, Tactile cues, and Verbal cues Education comprehension: verbalized understanding, returned demonstration, verbal cues required, tactile cues required, and needs further education   HOME EXERCISE PROGRAM:    PT Short Term Goals -       PT SHORT TERM GOAL #1   Title Pt will be independent with HEP in order to improve strength and balance in order to decrease fall risk and improve function at home and work.    Baseline 01/04/2021= No formal HEP in place; 12/12 no HEP in place; 05/10/2021-Patient and his father were able to report compliance with curent HEP consisting of mostly seated/reclined LE strengthening. Both verbalize no questions at this time.    Time 6    Period Weeks    Status Achieved    Target Date 02/15/21              PT Long Term Goals -      PT LONG TERM GOAL #1   Title Patient will increase BLE gross strength by 1/2 muscle grade to improve functional strength for improved independence with potential gait, increased standing tolerance and increased ADL ability.    Baseline 01/04/2021- Patient presents with 1/5 to 3-/5 B LE strength with MMT; 12/12: goal partially met for Left knee/hip; 05/10/2021= 2-/5 bilateal Hip flex; 3+/5 bilateral Knee ext; 06/21/2021= Patient presents with 2-/5 bilateral Hip flex; 3+/5 bilateral knee ext/flex; 2-/5 left ankle DF; 0/5 right ankle- and able to increase reps and resistance with LE's. 09/15/2021- Patient technically presents with 2-/5 B hip flex/abd/add - but he is able to raise his hip up to  approx 100 deg which has improved. 3+/5 Bilateral knee ext, 2-/5 left ankle and 0/5 right ankle. 12/08/2021= Patient able to lift left knee at 110 deg of hip flex; presents with 3+/5 knee ext, 2-/5 left ankle DF and 0/5 right ankle DF, 2-/5 bilateral Hip abd in seated position.    Time 12    Period Weeks    Status On-going    Target Date 03/07/2022     PT LONG TERM GOAL #2   Title Patient will tolerate sitting unsupported demonstrating erect sitting posture for 15 minutes with CGA to demonstrate improved back extensor strength and improved sitting tolerance.    Baseline 01/04/2021- Patient confied to sitting in lift chair or electric power chair with back support and unable to sit upright without physical assistance; 12/12: tolerates <1 minutes upright unsupported sitting. 05/10/2021=static sit with forward trunk lean  in his  power wheelchair without back support x approx 3 min. 06/21/2021=Unable to assess today due to patient with acute back pain but on previous visit able to sit x 8 min without back support. 09/15/2021- on last visit- 09/13/2021- patient was able to sit unsupported x 8 min at edge of mat. 10/13/2023 - Patient was able to sit at edge of mat with varying level of assist today from SBA to min A for a total of 20 min. 12/13/2021= Patient demonstrated unsupported sitting at edge of mat for approx 20 min   Time 12    Period Weeks    Status GOAL MET   Target Date 12/08/21      PT LONG TERM GOAL #3   Title Patient will demonstrate ability to perform static standing in // bars > 2 min with Max Assist  without loss of balance and fair posture for improved overall strength for pre-gait and transfer activities.    Baseline 01/04/2021= Patient current uanble to stand- Dependent on hoyer or sit to stand lift for transfers. 05/10/2021=Not appropriate yet- Currently still dependent with all transfers using hoyer. 06/21/2021= Patient continuing now to focus on LE strengthening to prepare for standing-unable to  try today due to acute low back pain-  planning on attempting in new cert period. 09/15/2021- Patient has attempted standing 2x in past two week- max Assist of 2 people - only once was he successful to clearing his bottom from chair - Will continue to be a focus during the new certification. 12/13/2021= Patient has been limited secondary to increased overall low back pain during this certification and will require more time to focus on this goal.    Time 12    Period Weeks    Status On-going    Target Date 03/07/2022     PT LONG TERM GOAL #4   Title Pt will improve FOTO score by 10 points or more demonstrating improved perceived functional ability    Baseline FOTO 7 on 10/17; 03/15/21: FOTO 12; 05/10/2021 06/21/2021= 1; 09/15/2021= 9; 12/13/2021= Will issue next visit    Time 12    Period Weeks    Status On-going    Target Date 03/07/2022     PT LONG TERM GOAL #5   Title Patient will perform sit to stand transfer with appropriate AD and max assist of 2 people with 75% consistency to prepare for pregait activities.    Baseline 09/15/2021= Patient unable to stand well- unable to clear his bottom off chair with Max assist of 2 persons. 12/13/2021- Goal not appropriate to try yet but will keep and roll over to next cert as shift continues to focus on transfers/standing   Time 12    Period Weeks    Status New    Target Date 03/07/2022   PT LONG TERM GOAL #6  Title Patient will tolerate sitting unsupported demonstrating erect sitting posture for 30 minutes with CGA to demonstrate improved back extensor strength and improved sitting tolerance.   Baseline 12/13/2021= Patient demonstrated unsupported sitting at edge of mat for approx 20 min;  12/29/2021- Patient performed approx 30 min of dynamic sitting activities today.   Time 12   Period Weeks   Status ONGOING  Target Date 03/07/2022            Plan     Clinical Impression Statement Treatment limited secondary to patient reporting feeling sore and  declined to transfer to mat today. He was fatigued and reporting soreness with BLE during therex- Requiring more  assist with right LE today.   Patient would benefit from additional skilled PT intervention to improve strength, balance, and mobility for improved quality of life.    Personal Factors and Comorbidities Comorbidity 3+;Time since onset of injury/illness/exacerbation    Comorbidities CVA, diabetes, Seizures    Examination-Activity Limitations Bathing;Bed Mobility;Bend;Caring for Others;Carry;Dressing;Hygiene/Grooming;Lift;Locomotion Level;Reach Overhead;Self Feeding;Sit;Squat;Stairs;Stand;Transfers;Toileting    Examination-Participation Restrictions Cleaning;Community Activity;Driving;Laundry;Medication Management;Meal Prep;Occupation;Personal Finances;Shop;Yard Work;Volunteer    Stability/Clinical Decision Making Evolving/Moderate complexity    Rehab Potential Fair    PT Frequency 2x / week    PT Duration 12 weeks    PT Treatment/Interventions ADLs/Self Care Home Management;Cryotherapy;Electrical Stimulation;Moist Heat;Ultrasound;DME Instruction;Gait training;Stair training;Functional mobility training;Therapeutic exercise;Balance training;Patient/family education;Orthotic Fit/Training;Neuromuscular re-education;Wheelchair mobility training;Manual techniques;Passive range of motion;Dry needling;Energy conservation;Taping;Visual/perceptual remediation/compensation;Joint Manipulations    PT Next Visit Plan core strength- sitting at EOM, postural control, sabina sit to stand if able; Continue with progressive LE Strengthening    PT Home Exercise Plan No changes to HEP today    Consulted and Agree with Plan of Care Patient;Family member/caregiver    Family Member Consulted Dad               Lewis Moccasin, PT 01/04/2022, 11:20 AM

## 2022-01-05 ENCOUNTER — Ambulatory Visit: Payer: BC Managed Care – PPO

## 2022-01-05 DIAGNOSIS — R269 Unspecified abnormalities of gait and mobility: Secondary | ICD-10-CM | POA: Diagnosis not present

## 2022-01-05 DIAGNOSIS — I6930 Unspecified sequelae of cerebral infarction: Secondary | ICD-10-CM

## 2022-01-05 DIAGNOSIS — R41841 Cognitive communication deficit: Secondary | ICD-10-CM | POA: Diagnosis not present

## 2022-01-05 DIAGNOSIS — M6281 Muscle weakness (generalized): Secondary | ICD-10-CM

## 2022-01-05 DIAGNOSIS — R2681 Unsteadiness on feet: Secondary | ICD-10-CM | POA: Diagnosis not present

## 2022-01-05 DIAGNOSIS — R278 Other lack of coordination: Secondary | ICD-10-CM

## 2022-01-05 DIAGNOSIS — R2689 Other abnormalities of gait and mobility: Secondary | ICD-10-CM | POA: Diagnosis not present

## 2022-01-05 DIAGNOSIS — R262 Difficulty in walking, not elsewhere classified: Secondary | ICD-10-CM | POA: Diagnosis not present

## 2022-01-06 DIAGNOSIS — I10 Essential (primary) hypertension: Secondary | ICD-10-CM | POA: Diagnosis not present

## 2022-01-06 DIAGNOSIS — Z7982 Long term (current) use of aspirin: Secondary | ICD-10-CM | POA: Diagnosis not present

## 2022-01-06 DIAGNOSIS — Z6841 Body Mass Index (BMI) 40.0 and over, adult: Secondary | ICD-10-CM | POA: Diagnosis not present

## 2022-01-06 DIAGNOSIS — Z8673 Personal history of transient ischemic attack (TIA), and cerebral infarction without residual deficits: Secondary | ICD-10-CM | POA: Diagnosis not present

## 2022-01-06 DIAGNOSIS — I639 Cerebral infarction, unspecified: Secondary | ICD-10-CM | POA: Diagnosis not present

## 2022-01-06 DIAGNOSIS — U071 COVID-19: Secondary | ICD-10-CM | POA: Diagnosis not present

## 2022-01-06 DIAGNOSIS — E119 Type 2 diabetes mellitus without complications: Secondary | ICD-10-CM | POA: Diagnosis not present

## 2022-01-06 DIAGNOSIS — Z7901 Long term (current) use of anticoagulants: Secondary | ICD-10-CM | POA: Diagnosis not present

## 2022-01-06 DIAGNOSIS — I82413 Acute embolism and thrombosis of femoral vein, bilateral: Secondary | ICD-10-CM | POA: Diagnosis not present

## 2022-01-06 DIAGNOSIS — J1282 Pneumonia due to coronavirus disease 2019: Secondary | ICD-10-CM | POA: Diagnosis not present

## 2022-01-06 NOTE — Therapy (Signed)
OUTPATIENT PHYSICAL THERAPY TREATMENT NOTE/ Patient Name: Paul Hall MRN: [id number] DOB:Apr 28, 1995, 26 y.o., male Today's Date: 01/06/2022  PCP:  Paul Hall PROVIDER:  Collene Mares, PA-C   PT End of Session - 01/05/22 1554     Visit Number 62    Number of Visits 61    Date for PT Re-Evaluation 03/07/22    Authorization Type [employer] and [employer]- Need auth past 12th visit    Authorization Time Period 01/04/21-03/29/21; Recert 33/58/2518-9/84/2103; Recert 05/01/1186- 09/09/7371    Progress Note Due on Visit 32    PT Start Time 1518    PT Stop Time 1552    PT Time Calculation (min) 34 min    Equipment Utilized During Treatment Gait belt   Hoyer lift   Activity Tolerance Patient tolerated treatment well   queasiness/nausea   Behavior During Therapy WFL for tasks assessed/performed              Past Medical History:  Diagnosis Date   Diabetes mellitus ([place])    Hypertension    Stroke [facility])    Past Surgical History:  Procedure Laterality Date   IVC FILTER PLACEMENT ([place] [facility]X)     LEG SURGERY     PEG TUBE PLACEMENT     TRACHEOSTOMY     Patient Active Problem List   Diagnosis Date Noted   Sepsis due to vancomycin resistant Enterococcus species ([place]) 06/06/2019   SIRS (systemic inflammatory response syndrome) ([place]) 06/05/2019   Acute lower UTI 06/05/2019   VRE (vancomycin-resistant Enterococci) infection 06/05/2019   Anemia 06/05/2019   Skin ulcer of sacrum with necrosis of muscle ([place])    Urinary retention    Type 2 diabetes mellitus without complication, with long-term current use of insulin (HCC)    Tachycardia    Lower extremity edema    Acute metabolic encephalopathy    Obstructive sleep apnea    Morbid obesity with BMI of 60.0-69.9, adult ([place])    Goals of care, counseling/discussion    Palliative care encounter    Sepsis ([place]) 04/27/2019   H/O insulin dependent diabetes mellitus 04/27/2019   History of CVA with residual deficit 04/27/2019    Seizure disorder ([place]) 04/27/2019   Decubitus ulcer of sacral region, stage 4 ([place]) 04/27/2019    REFERRING DIAG: Cerebral infarction, unspecified   THERAPY DIAG:  Muscle weakness (generalized)  Other lack of coordination  History of CVA with residual deficit  Difficulty in walking, not elsewhere classified  Unsteadiness on feet  Abnormality of gait and mobility  Other abnormalities of gait and mobility  Rationale for Evaluation and Treatment Rehabilitation  PERTINENT HISTORY: Paul Hall is a 25yoM who presents with severe weakness, quadriparesis, altered sensorium, and visual impairment s/p critical illness and prolonged hospitalization. Pt hospitalized in October 2020 with ARDS 2/2 COVID19 infection. Pt sustained a complex and lengthy hospitalization which included tracheostomy, prolonged sedation, ECMO. In this period pt sustained CVA and SDH. Pt has now been liberated from tracheostomy and G-tube. Pt has since been hospitalized for wound infection and UTI. Pt lives with parents at home, has hospital bed and left chair, hoyer lift transfers, and power WC for mobility needs. Pt needs heavy physical assistance with ADL 2/2 BUE contractures and motor dysfunction   PRECAUTIONS: Fall  SUBJECTIVE: Patient reports feeling better and willing to try to sit at edge of mat for activities today.  PAIN:  Are you having pain? No   TODAY'S TREATMENT:    Patient was hoyer lifted (assist from PT  and his father)  from power wheelchair to mat table and positioned along side of mat with feet positioned on 6" block and back supported by wedge and 3 pillows. After successful transfer - he was assisted from reclined to upright sitting with patient pulling himself up with hand held assist from PT- mod Assist.  Then he progressed to the following:   Static sitting- 3 min - Focusing on sitting up straight and holding his head up. (Multiple VC to keep head up)  Lateral weight shifting - each x 15  reps ( Assist to straighten right elbow and attempted to extend wrist -ht side)  Sitting neck rolls x 10  Cervical retraction-x 10 (Increased VC for technique)  A/P weight shifting (ab crunch forward/back extension) x 15 reps  Thoracic trunk rotation - 15 reps each direction Seated chest press/punch - x 15 each Arm  *Patient became fatigued of sitting citing feeling very tired and requested to end session and return back to chair.    Patient reported fatigue and positioned back down into reclined position and hoyer back to power w/c.   Education provided throughout session via VC/TC and demonstration to facilitate movement at target joints and correct muscle activation for all testing and exercises performed.       PATIENT EDUCATION: Education details: posture cues with sitting Person educated: Patient Education method: Explanation, Demonstration, Tactile cues, and Verbal cues Education comprehension: verbalized understanding, returned demonstration, verbal cues required, tactile cues required, and needs further education   HOME EXERCISE PROGRAM:    PT Short Term Goals -       PT SHORT TERM GOAL #1   Title Pt will be independent with HEP in order to improve strength and balance in order to decrease fall risk and improve function at home and work.    Baseline 01/04/2021= No formal HEP in place; 12/12 no HEP in place; 05/10/2021-Patient and his father were able to report compliance with curent HEP consisting of mostly seated/reclined LE strengthening. Both verbalize no questions at this time.    Time 6    Period Weeks    Status Achieved    Target Date 02/15/21              PT Long Term Goals -      PT LONG TERM GOAL #1   Title Patient will increase BLE gross strength by 1/2 muscle grade to improve functional strength for improved independence with potential gait, increased standing tolerance and increased ADL ability.    Baseline 01/04/2021- Patient presents with 1/5 to 3-/5  B LE strength with MMT; 12/12: goal partially met for Left knee/hip; 05/10/2021= 2-/5 bilateal Hip flex; 3+/5 bilateral Knee ext; 06/21/2021= Patient presents with 2-/5 bilateral Hip flex; 3+/5 bilateral knee ext/flex; 2-/5 left ankle DF; 0/5 right ankle- and able to increase reps and resistance with LE's. 09/15/2021- Patient technically presents with 2-/5 B hip flex/abd/add - but he is able to raise his hip up to approx 100 deg which has improved. 3+/5 Bilateral knee ext, 2-/5 left ankle and 0/5 right ankle. 12/08/2021= Patient able to lift left knee at 110 deg of hip flex; presents with 3+/5 knee ext, 2-/5 left ankle DF and 0/5 right ankle DF, 2-/5 bilateral Hip abd in seated position.    Time 12    Period Weeks    Status On-going    Target Date 03/07/2022     PT LONG TERM GOAL #2   Title Patient will tolerate sitting unsupported demonstrating  erect sitting posture for 15 minutes with CGA to demonstrate improved back extensor strength and improved sitting tolerance.    Baseline 01/04/2021- Patient confied to sitting in lift chair or electric power chair with back support and unable to sit upright without physical assistance; 12/12: tolerates <1 minutes upright unsupported sitting. 05/10/2021=static sit with forward trunk lean  in his power wheelchair without back support x approx 3 min. 06/21/2021=Unable to assess today due to patient with acute back pain but on previous visit able to sit x 8 min without back support. 09/15/2021- on last visit- 09/13/2021- patient was able to sit unsupported x 8 min at edge of mat. 10/13/2023 - Patient was able to sit at edge of mat with varying level of assist today from SBA to min A for a total of 20 min. 12/13/2021= Patient demonstrated unsupported sitting at edge of mat for approx 20 min   Time 12    Period Weeks    Status GOAL MET   Target Date 12/08/21      PT LONG TERM GOAL #3   Title Patient will demonstrate ability to perform static standing in // bars > 2 min with Max  Assist  without loss of balance and fair posture for improved overall strength for pre-gait and transfer activities.    Baseline 01/04/2021= Patient current uanble to stand- Dependent on hoyer or sit to stand lift for transfers. 05/10/2021=Not appropriate yet- Currently still dependent with all transfers using hoyer. 06/21/2021= Patient continuing now to focus on LE strengthening to prepare for standing-unable to try today due to acute low back pain-  planning on attempting in new cert period. 09/15/2021- Patient has attempted standing 2x in past two week- max Assist of 2 people - only once was he successful to clearing his bottom from chair - Will continue to be a focus during the new certification. 12/13/2021= Patient has been limited secondary to increased overall low back pain during this certification and will require more time to focus on this goal.    Time 12    Period Weeks    Status On-going    Target Date 03/07/2022     PT LONG TERM GOAL #4   Title Pt will improve FOTO score by 10 points or more demonstrating improved perceived functional ability    Baseline FOTO 7 on 10/17; 03/15/21: FOTO 12; 05/10/2021 06/21/2021= 1; 09/15/2021= 9; 12/13/2021= Will issue next visit    Time 12    Period Weeks    Status On-going    Target Date 03/07/2022     PT LONG TERM GOAL #5   Title Patient will perform sit to stand transfer with appropriate AD and max assist of 2 people with 75% consistency to prepare for pregait activities.    Baseline 09/15/2021= Patient unable to stand well- unable to clear his bottom off chair with Max assist of 2 persons. 12/13/2021- Goal not appropriate to try yet but will keep and roll over to next cert as shift continues to focus on transfers/standing   Time 12    Period Weeks    Status New    Target Date 03/07/2022   PT LONG TERM GOAL #6  Title Patient will tolerate sitting unsupported demonstrating erect sitting posture for 30 minutes with CGA to demonstrate improved back extensor  strength and improved sitting tolerance.   Baseline 12/13/2021= Patient demonstrated unsupported sitting at edge of mat for approx 20 min;  12/29/2021- Patient performed approx 30 min of dynamic sitting activities today.  Time 12   Period Weeks   Status ONGOING  Target Date 03/07/2022            Plan     Clinical Impression Statement Patient presents with good motivation for today's session. He was agreeable to perform therapeutic activities in sitting to continue to focus on his unsupported sitting balance and endurance. He performed well initially- focusing on some neck mobility and later dynamic UE activity/core activation. He became fatigued after several activities and requested to end session. He overall continues to improve with sitting tolerance but needs to be more consistent in sessions for max benefit- educated both patient and father on importance of consistency.  Patient would benefit from additional skilled PT intervention to improve strength, balance, and mobility for improved quality of life.    Personal Factors and Comorbidities Comorbidity 3+;Time since onset of injury/illness/exacerbation    Comorbidities CVA, diabetes, Seizures    Examination-Activity Limitations Bathing;Bed Mobility;Bend;Caring for Others;Carry;Dressing;Hygiene/Grooming;Lift;Locomotion Level;Reach Overhead;Self Feeding;Sit;Squat;Stairs;Stand;Transfers;Toileting    Examination-Participation Restrictions Cleaning;Community Activity;Driving;Laundry;Medication Management;Meal Prep;Occupation;Personal Finances;Shop;Yard Work;Volunteer    Stability/Clinical Decision Making Evolving/Moderate complexity    Rehab Potential Fair    PT Frequency 2x / week    PT Duration 12 weeks    PT Treatment/Interventions ADLs/Self Care Home Management;Cryotherapy;Electrical Stimulation;Moist Heat;Ultrasound;DME Instruction;Gait training;Stair training;Functional mobility training;Therapeutic exercise;Balance training;Patient/family  education;Orthotic Fit/Training;Neuromuscular re-education;Wheelchair mobility training;Manual techniques;Passive range of motion;Dry needling;Energy conservation;Taping;Visual/perceptual remediation/compensation;Joint Manipulations    PT Next Visit Plan core strength- sitting at EOM, postural control, sabina sit to stand if able; Continue with progressive LE Strengthening    PT Home Exercise Plan No changes to HEP today    Consulted and Agree with Plan of Care Patient;Family member/caregiver    Family Member Consulted Dad               Lewis Moccasin, PT 01/06/2022, 9:17 AM

## 2022-01-10 ENCOUNTER — Ambulatory Visit: Payer: BC Managed Care – PPO

## 2022-01-12 ENCOUNTER — Ambulatory Visit: Payer: BC Managed Care – PPO

## 2022-01-12 DIAGNOSIS — M6281 Muscle weakness (generalized): Secondary | ICD-10-CM

## 2022-01-12 DIAGNOSIS — R2689 Other abnormalities of gait and mobility: Secondary | ICD-10-CM | POA: Diagnosis not present

## 2022-01-12 DIAGNOSIS — R41841 Cognitive communication deficit: Secondary | ICD-10-CM | POA: Diagnosis not present

## 2022-01-12 DIAGNOSIS — R262 Difficulty in walking, not elsewhere classified: Secondary | ICD-10-CM

## 2022-01-12 DIAGNOSIS — I6930 Unspecified sequelae of cerebral infarction: Secondary | ICD-10-CM

## 2022-01-12 DIAGNOSIS — R278 Other lack of coordination: Secondary | ICD-10-CM

## 2022-01-12 DIAGNOSIS — R2681 Unsteadiness on feet: Secondary | ICD-10-CM | POA: Diagnosis not present

## 2022-01-12 DIAGNOSIS — R269 Unspecified abnormalities of gait and mobility: Secondary | ICD-10-CM | POA: Diagnosis not present

## 2022-01-12 NOTE — Therapy (Signed)
OUTPATIENT PHYSICAL THERAPY TREATMENT NOTE/ Patient Name: Paul Hall MRN: [id number] DOB:[birth year], 26 y.o., male Today's Date: 01/13/2022  PCP:  Loistine Chance PROVIDER:  Collene Mares, PA-C   PT End of Session - 01/12/22 1612     Visit Number 84    Number of Visits 57    Date for PT Re-Evaluation 03/07/22    Authorization Type [employer] and [employer] Medicaid- Need auth past 12th visit    Authorization Time Period 01/04/21-03/29/21; Recert 51/88/4166-0/63/0160; Recert 04/12/3233- 08/09/3218; Recert 2/54/2706-23/10/6281    Progress Note Due on Visit 42    PT Start Time 1522    PT Stop Time 1558    PT Time Calculation (min) 36 min    Equipment Utilized During Treatment Gait belt   Hoyer lift   Activity Tolerance Patient tolerated treatment well   queasiness/nausea   Behavior During Therapy WFL for tasks assessed/performed              Past Medical History:  Diagnosis Date   Diabetes mellitus ([place])    Hypertension    Stroke [facility])    Past Surgical History:  Procedure Laterality Date   IVC FILTER PLACEMENT ([place] [facility])     LEG SURGERY     PEG TUBE PLACEMENT     TRACHEOSTOMY     Patient Active Problem List   Diagnosis Date Noted   Sepsis due to vancomycin resistant Enterococcus species ([place]) 06/06/2019   SIRS (systemic inflammatory response syndrome) ([place]) 06/05/2019   Acute lower UTI 06/05/2019   VRE (vancomycin-resistant Enterococci) infection 06/05/2019   Anemia 06/05/2019   Skin ulcer of sacrum with necrosis of muscle ([place])    Urinary retention    Type 2 diabetes mellitus without complication, with long-term current use of insulin (HCC)    Tachycardia    Lower extremity edema    Acute metabolic encephalopathy    Obstructive sleep apnea    Morbid obesity with BMI of 60.0-69.9, adult ([place])    Goals of care, counseling/discussion    Palliative care encounter    Sepsis ([place]) 04/27/2019   H/O insulin dependent diabetes mellitus 04/27/2019   History of CVA with  residual deficit 04/27/2019   Seizure disorder ([place]) 04/27/2019   Decubitus ulcer of sacral region, stage 4 ([place]) 04/27/2019    REFERRING DIAG: Cerebral infarction, unspecified   THERAPY DIAG:  Muscle weakness (generalized)  Other lack of coordination  History of CVA with residual deficit  Difficulty in walking, not elsewhere classified  Unsteadiness on feet  Abnormality of gait and mobility  Other abnormalities of gait and mobility  Rationale for Evaluation and Treatment Rehabilitation  PERTINENT HISTORY: Paul Hall is a 25yoM who presents with severe weakness, quadriparesis, altered sensorium, and visual impairment s/p critical illness and prolonged hospitalization. Pt hospitalized in October 2020 with ARDS 2/2 COVID19 infection. Pt sustained a complex and lengthy hospitalization which included tracheostomy, prolonged sedation, ECMO. In this period pt sustained CVA and SDH. Pt has now been liberated from tracheostomy and G-tube. Pt has since been hospitalized for wound infection and UTI. Pt lives with parents at home, has hospital bed and left chair, hoyer lift transfers, and power WC for mobility needs. Pt needs heavy physical assistance with ADL 2/2 BUE contractures and motor dysfunction   PRECAUTIONS: Fall  SUBJECTIVE: Patient reports feeling good. He brought in video to show him standing with his parents using sit to stand lift.  PAIN:  Are you having pain? No   TODAY'S TREATMENT:    Manual Therapy:  B Hamstring (seated) x 30 sec hold x 4 B Hip ER/IR x 30 sec hold x 4 B Knee flex x 30 sec x3  Therex:    Quad sets- 3 sec hold x 15 reps total each LE (VC to hold for 3 sec) Heel slides- AAROM 2 sets of 15 reps.  LAQ- BLE - AROM- 2 sets of 15 reps.  Hip add squeeze with manual resistance- VC to hold 3 sec x 15 reps total Manual resistive hip abd- Hold 3 sec x 15 reps Hip flex (AAROM) BLE 2 sets of 15 reps. Leg press (manual) BLE - 2 sets of 15  reps.  Patient reports very fatigued at end of session- Reports no pain just tired.   Education provided throughout session via VC/TC and demonstration to facilitate movement at target joints and correct muscle activation for all testing and exercises performed.       PATIENT EDUCATION: Education details: posture cues with sitting Person educated: Patient Education method: Explanation, Demonstration, Tactile cues, and Verbal cues Education comprehension: verbalized understanding, returned demonstration, verbal cues required, tactile cues required, and needs further education   HOME EXERCISE PROGRAM:    PT Short Term Goals -       PT SHORT TERM GOAL #1   Title Pt will be independent with HEP in order to improve strength and balance in order to decrease fall risk and improve function at home and work.    Baseline 01/04/2021= No formal HEP in place; 12/12 no HEP in place; 05/10/2021-Patient and his father were able to report compliance with curent HEP consisting of mostly seated/reclined LE strengthening. Both verbalize no questions at this time.    Time 6    Period Weeks    Status Achieved    Target Date 02/15/21              PT Long Term Goals -      PT LONG TERM GOAL #1   Title Patient will increase BLE gross strength by 1/2 muscle grade to improve functional strength for improved independence with potential gait, increased standing tolerance and increased ADL ability.    Baseline 01/04/2021- Patient presents with 1/5 to 3-/5 B LE strength with MMT; 12/12: goal partially met for Left knee/hip; 05/10/2021= 2-/5 bilateal Hip flex; 3+/5 bilateral Knee ext; 06/21/2021= Patient presents with 2-/5 bilateral Hip flex; 3+/5 bilateral knee ext/flex; 2-/5 left ankle DF; 0/5 right ankle- and able to increase reps and resistance with LE's. 09/15/2021- Patient technically presents with 2-/5 B hip flex/abd/add - but he is able to raise his hip up to approx 100 deg which has improved. 3+/5 Bilateral  knee ext, 2-/5 left ankle and 0/5 right ankle. 12/08/2021= Patient able to lift left knee at 110 deg of hip flex; presents with 3+/5 knee ext, 2-/5 left ankle DF and 0/5 right ankle DF, 2-/5 bilateral Hip abd in seated position.    Time 12    Period Weeks    Status On-going    Target Date 03/07/2022     PT LONG TERM GOAL #2   Title Patient will tolerate sitting unsupported demonstrating erect sitting posture for 15 minutes with CGA to demonstrate improved back extensor strength and improved sitting tolerance.    Baseline 01/04/2021- Patient confied to sitting in lift chair or electric power chair with back support and unable to sit upright without physical assistance; 12/12: tolerates <1 minutes upright unsupported sitting. 05/10/2021=static sit with forward trunk lean  in his power wheelchair without  back support x approx 3 min. 06/21/2021=Unable to assess today due to patient with acute back pain but on previous visit able to sit x 8 min without back support. 09/15/2021- on last visit- 09/13/2021- patient was able to sit unsupported x 8 min at edge of mat. 10/13/2023 - Patient was able to sit at edge of mat with varying level of assist today from SBA to min A for a total of 20 min. 12/13/2021= Patient demonstrated unsupported sitting at edge of mat for approx 20 min   Time 12    Period Weeks    Status GOAL MET   Target Date 12/08/21      PT LONG TERM GOAL #3   Title Patient will demonstrate ability to perform static standing in // bars > 2 min with Max Assist  without loss of balance and fair posture for improved overall strength for pre-gait and transfer activities.    Baseline 01/04/2021= Patient current uanble to stand- Dependent on hoyer or sit to stand lift for transfers. 05/10/2021=Not appropriate yet- Currently still dependent with all transfers using hoyer. 06/21/2021= Patient continuing now to focus on LE strengthening to prepare for standing-unable to try today due to acute low back pain-  planning on  attempting in new cert period. 09/15/2021- Patient has attempted standing 2x in past two week- max Assist of 2 people - only once was he successful to clearing his bottom from chair - Will continue to be a focus during the new certification. 12/13/2021= Patient has been limited secondary to increased overall low back pain during this certification and will require more time to focus on this goal.    Time 12    Period Weeks    Status On-going    Target Date 03/07/2022     PT LONG TERM GOAL #4   Title Pt will improve FOTO score by 10 points or more demonstrating improved perceived functional ability    Baseline FOTO 7 on 10/17; 03/15/21: FOTO 12; 05/10/2021 06/21/2021= 1; 09/15/2021= 9; 12/13/2021= Will issue next visit    Time 12    Period Weeks    Status On-going    Target Date 03/07/2022     PT LONG TERM GOAL #5   Title Patient will perform sit to stand transfer with appropriate AD and max assist of 2 people with 75% consistency to prepare for pregait activities.    Baseline 09/15/2021= Patient unable to stand well- unable to clear his bottom off chair with Max assist of 2 persons. 12/13/2021- Goal not appropriate to try yet but will keep and roll over to next cert as shift continues to focus on transfers/standing   Time 12    Period Weeks    Status New    Target Date 03/07/2022   PT LONG TERM GOAL #6  Title Patient will tolerate sitting unsupported demonstrating erect sitting posture for 30 minutes with CGA to demonstrate improved back extensor strength and improved sitting tolerance.   Baseline 12/13/2021= Patient demonstrated unsupported sitting at edge of mat for approx 20 min;  12/29/2021- Patient performed approx 30 min of dynamic sitting activities today.   Time 12   Period Weeks   Status ONGOING  Target Date 03/07/2022            Plan     Clinical Impression Statement Patient performed seated therex due to no hoyer present at today's session. He performed well- Started off very tight  with hips/hamstring but did loosen up with manual stretching- exhibiting very  tight right hIp ER and hamstrings. He presented with more active ROM and continued ability to terminally extend right knee versus some previous visits. Plan to try sit to stand lift next session and attempt standing. Patient would benefit from additional skilled PT intervention to improve strength, balance, and mobility for improved quality of life.    Personal Factors and Comorbidities Comorbidity 3+;Time since onset of injury/illness/exacerbation    Comorbidities CVA, diabetes, Seizures    Examination-Activity Limitations Bathing;Bed Mobility;Bend;Caring for Others;Carry;Dressing;Hygiene/Grooming;Lift;Locomotion Level;Reach Overhead;Self Feeding;Sit;Squat;Stairs;Stand;Transfers;Toileting    Examination-Participation Restrictions Cleaning;Community Activity;Driving;Laundry;Medication Management;Meal Prep;Occupation;Personal Finances;Shop;Yard Work;Volunteer    Stability/Clinical Decision Making Evolving/Moderate complexity    Rehab Potential Fair    PT Frequency 2x / week    PT Duration 12 weeks    PT Treatment/Interventions ADLs/Self Care Home Management;Cryotherapy;Electrical Stimulation;Moist Heat;Ultrasound;DME Instruction;Gait training;Stair training;Functional mobility training;Therapeutic exercise;Balance training;Patient/family education;Orthotic Fit/Training;Neuromuscular re-education;Wheelchair mobility training;Manual techniques;Passive range of motion;Dry needling;Energy conservation;Taping;Visual/perceptual remediation/compensation;Joint Manipulations    PT Next Visit Plan core strength- sitting at EOM, postural control, sabina sit to stand if able; Continue with progressive LE Strengthening    PT Home Exercise Plan No changes to HEP today    Consulted and Agree with Plan of Care Patient;Family member/caregiver    Family Member Consulted Dad               Lewis Moccasin, PT 01/13/2022, 8:53  AM

## 2022-01-17 ENCOUNTER — Ambulatory Visit: Payer: BC Managed Care – PPO

## 2022-01-17 ENCOUNTER — Ambulatory Visit: Payer: BC Managed Care – PPO | Admitting: Occupational Therapy

## 2022-01-17 DIAGNOSIS — R269 Unspecified abnormalities of gait and mobility: Secondary | ICD-10-CM

## 2022-01-17 DIAGNOSIS — R2681 Unsteadiness on feet: Secondary | ICD-10-CM

## 2022-01-17 DIAGNOSIS — M6281 Muscle weakness (generalized): Secondary | ICD-10-CM

## 2022-01-17 DIAGNOSIS — R2689 Other abnormalities of gait and mobility: Secondary | ICD-10-CM | POA: Diagnosis not present

## 2022-01-17 DIAGNOSIS — R278 Other lack of coordination: Secondary | ICD-10-CM

## 2022-01-17 DIAGNOSIS — R41841 Cognitive communication deficit: Secondary | ICD-10-CM | POA: Diagnosis not present

## 2022-01-17 DIAGNOSIS — R262 Difficulty in walking, not elsewhere classified: Secondary | ICD-10-CM

## 2022-01-17 DIAGNOSIS — I6930 Unspecified sequelae of cerebral infarction: Secondary | ICD-10-CM

## 2022-01-17 NOTE — Therapy (Signed)
OUTPATIENT PHYSICAL THERAPY TREATMENT NOTE/ Patient Name: Paul Hall MRN: [id number] DOB:1995-04-13, 26 y.o., male Today's Date: 01/17/2022  PCP:  Loistine Chance PROVIDER:  Collene Mares, PA-C   PT End of Session - 01/17/22 1631     Visit Number 58    Number of Visits 81    Date for PT Re-Evaluation 03/07/22    Authorization Type [employer] and [employer]- Need auth past 12th visit    Authorization Time Period 01/04/21-03/29/21; Recert 44/62/8638-1/77/1165; Recert 7/90/3833- 06/09/3289; Recert 12/19/6058-07/08/9975    Progress Note Due on Visit 20    PT Start Time 1522    PT Stop Time 1600    PT Time Calculation (min) 38 min    Equipment Utilized During Treatment Gait belt   Hoyer lift   Activity Tolerance Patient tolerated treatment well   queasiness/nausea   Behavior During Therapy WFL for tasks assessed/performed               Past Medical History:  Diagnosis Date   Diabetes mellitus ([place])    Hypertension    Stroke [facility])    Past Surgical History:  Procedure Laterality Date   IVC FILTER PLACEMENT ([facility])     LEG SURGERY     PEG TUBE PLACEMENT     TRACHEOSTOMY     Patient Active Problem List   Diagnosis Date Noted   Sepsis due to vancomycin resistant Enterococcus species ([place]) 06/06/2019   SIRS (systemic inflammatory response syndrome) ([place]) 06/05/2019   Acute lower UTI 06/05/2019   VRE (vancomycin-resistant Enterococci) infection 06/05/2019   Anemia 06/05/2019   Skin ulcer of sacrum with necrosis of muscle ([place])    Urinary retention    Type 2 diabetes mellitus without complication, with long-term current use of insulin (HCC)    Tachycardia    Lower extremity edema    Acute metabolic encephalopathy    Obstructive sleep apnea    Morbid obesity with BMI of 60.0-69.9, adult ([place])    Goals of care, counseling/discussion    Palliative care encounter    Sepsis ([place]) 04/27/2019   H/O insulin dependent diabetes mellitus 04/27/2019   History of CVA with  residual deficit 04/27/2019   Seizure disorder ([place]) 04/27/2019   Decubitus ulcer of sacral region, stage 4 ([place]) 04/27/2019    REFERRING DIAG: Cerebral infarction, unspecified   THERAPY DIAG:  Muscle weakness (generalized)  Other lack of coordination  History of CVA with residual deficit  Difficulty in walking, not elsewhere classified  Unsteadiness on feet  Abnormality of gait and mobility  Other abnormalities of gait and mobility  Rationale for Evaluation and Treatment Rehabilitation  PERTINENT HISTORY: Paul Hall is a 25yoM who presents with severe weakness, quadriparesis, altered sensorium, and visual impairment s/p critical illness and prolonged hospitalization. Pt hospitalized in October 2020 with ARDS 2/2 COVID19 infection. Pt sustained a complex and lengthy hospitalization which included tracheostomy, prolonged sedation, ECMO. In this period pt sustained CVA and SDH. Pt has now been liberated from tracheostomy and G-tube. Pt has since been hospitalized for wound infection and UTI. Pt lives with parents at home, has hospital bed and left chair, hoyer lift transfers, and power WC for mobility needs. Pt needs heavy physical assistance with ADL 2/2 BUE contractures and motor dysfunction   PRECAUTIONS: Fall  SUBJECTIVE: Patient reports feeling good okay. Reports nervous about attempting to stand with lift but agreeable to try.  PAIN:  Are you having pain? No   TODAY'S TREATMENT:   **Attempted sit to stand lift  yet patient's feet unable to reach floor so unable to use - Will attempt again next visit- Will need to hoyer patient over to mat table that converts to sitting position and remove foot plate.   Therex:    Quad sets- 3 sec hold x 15 reps total each LE (VC to hold for 3 sec) Heel slides- AAROM 2 sets of 15 reps.  LAQ- BLE - AROM- 2 sets of 15 reps.  Hip flex (AAROM) BLE 2 sets of 15 reps B chest press into row - 2 sets of 12 reps  B horizontal shoulder abd -  2 sets of 12 reps B elbow flex/ext (manual resistance) - 2 sets of 12 reps.  Cross body- reach/puch x 15 reps each Forward trunk lean - x 15 reps B lateral trunk lean/reach x 15 reps.       Education provided throughout session via VC/TC and demonstration to facilitate movement at target joints and correct muscle activation for all testing and exercises performed.       PATIENT EDUCATION: Education details: posture cues with sitting Person educated: Patient Education method: Explanation, Demonstration, Tactile cues, and Verbal cues Education comprehension: verbalized understanding, returned demonstration, verbal cues required, tactile cues required, and needs further education   HOME EXERCISE PROGRAM:    PT Short Term Goals -       PT SHORT TERM GOAL #1   Title Pt will be independent with HEP in order to improve strength and balance in order to decrease fall risk and improve function at home and work.    Baseline 01/04/2021= No formal HEP in place; 12/12 no HEP in place; 05/10/2021-Patient and his father were able to report compliance with curent HEP consisting of mostly seated/reclined LE strengthening. Both verbalize no questions at this time.    Time 6    Period Weeks    Status Achieved    Target Date 02/15/21              PT Long Term Goals -      PT LONG TERM GOAL #1   Title Patient will increase BLE gross strength by 1/2 muscle grade to improve functional strength for improved independence with potential gait, increased standing tolerance and increased ADL ability.    Baseline 01/04/2021- Patient presents with 1/5 to 3-/5 B LE strength with MMT; 12/12: goal partially met for Left knee/hip; 05/10/2021= 2-/5 bilateal Hip flex; 3+/5 bilateral Knee ext; 06/21/2021= Patient presents with 2-/5 bilateral Hip flex; 3+/5 bilateral knee ext/flex; 2-/5 left ankle DF; 0/5 right ankle- and able to increase reps and resistance with LE's. 09/15/2021- Patient technically presents with 2-/5  B hip flex/abd/add - but he is able to raise his hip up to approx 100 deg which has improved. 3+/5 Bilateral knee ext, 2-/5 left ankle and 0/5 right ankle. 12/08/2021= Patient able to lift left knee at 110 deg of hip flex; presents with 3+/5 knee ext, 2-/5 left ankle DF and 0/5 right ankle DF, 2-/5 bilateral Hip abd in seated position.    Time 12    Period Weeks    Status On-going    Target Date 03/07/2022     PT LONG TERM GOAL #2   Title Patient will tolerate sitting unsupported demonstrating erect sitting posture for 15 minutes with CGA to demonstrate improved back extensor strength and improved sitting tolerance.    Baseline 01/04/2021- Patient confied to sitting in lift chair or electric power chair with back support and unable to sit upright without  physical assistance; 12/12: tolerates <1 minutes upright unsupported sitting. 05/10/2021=static sit with forward trunk lean  in his power wheelchair without back support x approx 3 min. 06/21/2021=Unable to assess today due to patient with acute back pain but on previous visit able to sit x 8 min without back support. 09/15/2021- on last visit- 09/13/2021- patient was able to sit unsupported x 8 min at edge of mat. 10/13/2023 - Patient was able to sit at edge of mat with varying level of assist today from SBA to min A for a total of 20 min. 12/13/2021= Patient demonstrated unsupported sitting at edge of mat for approx 20 min   Time 12    Period Weeks    Status GOAL MET   Target Date 12/08/21      PT LONG TERM GOAL #3   Title Patient will demonstrate ability to perform static standing in // bars > 2 min with Max Assist  without loss of balance and fair posture for improved overall strength for pre-gait and transfer activities.    Baseline 01/04/2021= Patient current uanble to stand- Dependent on hoyer or sit to stand lift for transfers. 05/10/2021=Not appropriate yet- Currently still dependent with all transfers using hoyer. 06/21/2021= Patient continuing now to  focus on LE strengthening to prepare for standing-unable to try today due to acute low back pain-  planning on attempting in new cert period. 09/15/2021- Patient has attempted standing 2x in past two week- max Assist of 2 people - only once was he successful to clearing his bottom from chair - Will continue to be a focus during the new certification. 12/13/2021= Patient has been limited secondary to increased overall low back pain during this certification and will require more time to focus on this goal.    Time 12    Period Weeks    Status On-going    Target Date 03/07/2022     PT LONG TERM GOAL #4   Title Pt will improve FOTO score by 10 points or more demonstrating improved perceived functional ability    Baseline FOTO 7 on 10/17; 03/15/21: FOTO 12; 05/10/2021 06/21/2021= 1; 09/15/2021= 9; 12/13/2021= Will issue next visit    Time 12    Period Weeks    Status On-going    Target Date 03/07/2022     PT LONG TERM GOAL #5   Title Patient will perform sit to stand transfer with appropriate AD and max assist of 2 people with 75% consistency to prepare for pregait activities.    Baseline 09/15/2021= Patient unable to stand well- unable to clear his bottom off chair with Max assist of 2 persons. 12/13/2021- Goal not appropriate to try yet but will keep and roll over to next cert as shift continues to focus on transfers/standing   Time 12    Period Weeks    Status New    Target Date 03/07/2022   PT LONG TERM GOAL #6  Title Patient will tolerate sitting unsupported demonstrating erect sitting posture for 30 minutes with CGA to demonstrate improved back extensor strength and improved sitting tolerance.   Baseline 12/13/2021= Patient demonstrated unsupported sitting at edge of mat for approx 20 min;  12/29/2021- Patient performed approx 30 min of dynamic sitting activities today.   Time 12   Period Weeks   Status ONGOING  Target Date 03/07/2022            Plan     Clinical Impression Statement  Treatment limited secondary to unable to stand using sit  to stand lift from his power wheelchair due to the fact that his legs would reach floor or foot plate. He later responded well with trunk/posture/LE strengthening -continues to perform near full ROM with right LE with LAQ and able to weight shift ant/lateral well overall today without LOB or complaint of LOB. Patient would benefit from additional skilled PT intervention to improve strength, balance, and mobility for improved quality of life.    Personal Factors and Comorbidities Comorbidity 3+;Time since onset of injury/illness/exacerbation    Comorbidities CVA, diabetes, Seizures    Examination-Activity Limitations Bathing;Bed Mobility;Bend;Caring for Others;Carry;Dressing;Hygiene/Grooming;Lift;Locomotion Level;Reach Overhead;Self Feeding;Sit;Squat;Stairs;Stand;Transfers;Toileting    Examination-Participation Restrictions Cleaning;Community Activity;Driving;Laundry;Medication Management;Meal Prep;Occupation;Personal Finances;Shop;Yard Work;Volunteer    Stability/Clinical Decision Making Evolving/Moderate complexity    Rehab Potential Fair    PT Frequency 2x / week    PT Duration 12 weeks    PT Treatment/Interventions ADLs/Self Care Home Management;Cryotherapy;Electrical Stimulation;Moist Heat;Ultrasound;DME Instruction;Gait training;Stair training;Functional mobility training;Therapeutic exercise;Balance training;Patient/family education;Orthotic Fit/Training;Neuromuscular re-education;Wheelchair mobility training;Manual techniques;Passive range of motion;Dry needling;Energy conservation;Taping;Visual/perceptual remediation/compensation;Joint Manipulations    PT Next Visit Plan core strength- sitting at EOM, postural control, sabina sit to stand if able; Continue with progressive LE Strengthening    PT Home Exercise Plan No changes to HEP today    Consulted and Agree with Plan of Care Patient;Family member/caregiver    Family Member Consulted Dad                Lewis Moccasin, PT 01/17/2022, 4:52 PM

## 2022-01-17 NOTE — Therapy (Unsigned)
[place] MAIN [facility] SERVICES [address] [place], [place], [address] Phone: [phone]   Fax:  [phone]  Occupational Therapy Evaluation  Patient Details  Name: Paul Hall MRN: [id number] Date of Birth: [birth year] No data recorded  Encounter Date: 01/17/2022   OT End of Session - 01/17/22 1719     Visit Number 1    Number of Visits 12    Date for OT Re-Evaluation 04/11/22    Authorization Type Progress report period starting 10/06/2021    OT Start Time 1605    OT Stop Time 1700    OT Time Calculation (min) 55 min    Equipment Utilized During Treatment tilt in space power wc    Activity Tolerance Patient tolerated treatment well    Behavior During Therapy WFL for tasks assessed/performed             Past Medical History:  Diagnosis Date   Diabetes mellitus ([place])    Hypertension    Stroke [facility])     Past Surgical History:  Procedure Laterality Date   IVC FILTER PLACEMENT ([facility])     LEG SURGERY     PEG TUBE PLACEMENT     TRACHEOSTOMY      There were no vitals filed for this visit.                          OT Short Term Goals - 09/13/21 1641       OT SHORT TERM GOAL #1   Title Pt and caregiver will obtain splint and follow wearing schedule consistently 7 days/week    Baseline 09/14/21: working to obtain splint    Time 6    Period Weeks    Status New    Target Date 10/26/21               OT Long Term Goals - 09/13/21 1623       OT LONG TERM GOAL #1   Title Pt. will improve FOTO score by 5 points for clinically significant improvements during ADLs, and IADLs,    Baseline 30th visit: FOTO: 37, 21st: FOTO 43. 10th Visit: FOTO score 42 Eval: FOTO score: 41 FOTO score: 39; 50th: FOTO 52; 09/13/21: FOTO 41    Time 12    Period Weeks    Status On-going    Target Date 12/07/21      OT LONG TERM GOAL #2   Title Pt. will improve right shoulder flexion by 15 degrees to assist  caregivers with UE dressing    Baseline Pt. continues to require assist with UE dressing secondary to limited ROM; 08/16/21: Max A-total A UB dressing; R shoulder flexion/abd combo 0-100; 09/13/21: R shoulder flexion/abd combo 0-110, R shoulder hiking noted    Time 12    Period Weeks    Status Revised    Target Date 12/07/21      OT LONG TERM GOAL #3   Title Pt. will improve right Abduction by 10 degrees to assist caregivers with underarm hygiene care.    Baseline Eval: AROM abduction RUE 76. Pt. continues to present with limited bilateral abduction. 21st: AROM abduction RUE 80.06/16/2021: Right shulder abduction 108. 06/23/2021: Right shoulder abduction: 110; 08/16/21: abd 110    Time 12    Period Weeks    Status Achieved    Target Date 09/08/21      OT LONG TERM GOAL #4   Title Pt. will demonstrate  visual compensatory strategies for navigating through his environment.    Baseline 30th visit: Pt. continues to require assist to navigate through his environment. 20th: Pt continues to require assist to navigate environment, minimal knowledge of strategies. Eval: Limited 06/23/2021: Pt. continues to be limited, and requires assist  with navigating through his environment; 08/16/21: father reports pt only steers his own wc in 1 open room of their house, otherwise dep to navigate environment. 09/13/21: limited steering outside of house, steers in 1 room of house. 09/13/21: pt seeing low vision OT specialist to address.    Time 12    Period Weeks    Status Deferred    Target Date 09/08/21      OT LONG TERM GOAL #5   Title Pt. will demonstrate visual compensatory strategies for tabletop tasks within his near space.    Baseline 06/16/2021: Pt. continues to present with visual limitations for tabletop tasks in his near space. Pt. continues to present with limited vision. 21st: Pt can identify objects on tabletop ~waterbottle sized, improves with contrasting colours, cannot ID small onjects; 08/16/21: no change  from last assessment period. 09/13/21: pt seeing low vision OT specialist to address.    Time 12    Period Weeks    Status Deferred    Target Date 09/08/21      OT LONG TERM GOAL #6   Title Pt. will perform self feeding with SETUP using adaptive equipment    Baseline 06/23/2021: Pt. is able to use his bilateral hands to perfrom hand to mouth patterns for self-feeding with finger foods. Pt. is now able to use his left hand to bring a water bottle to his mouth,a nd drink from it. 30th visit: Pt. continues to present with weakness limiting functional hand to mouth patterns of movement. Pt. is able to perform hand to mouth patterns, however reports weakness in his hand  limits a secure grasp on the utensil. Pt. requires assist to perform; Eval: Pt. has difficulty; 08/16/21: pt continues to use L hand for self feeding finger foods with set up.  Spasticity limits dexterity to manipulate eating utensils. 09/13/21: can grip built up handle however requires MIN A 2/2 vision and wrist ROM deficits.    Time 12    Period Weeks    Status Revised    Target Date 12/07/21      OT LONG TERM GOAL #7   Title Pt. will perform one step self-grooming tasks with SETUP    Baseline 06/23/2021: Pt. is able to perform the hand to face pattern with the left hand, however has difficulty actively sustaining his BUEs in elevation long enough to perform self-grooming tasks. 06/16/2021: Pt. is able to perform the hand to face pattern with the left hand, however has difficulty actively sustaining his BUEs in elevation long enough to perform self-grooming tasks; 08/16/21: same as 06/16/21. 09/13/21: limited by trunk and sustaining BUEs    Time 12    Period Weeks    Status Revised    Target Date 12/07/21      OT LONG TERM GOAL #8   Title Pt will complete UB dressing tasks with SETUP and assist to thread over hands only    Baseline 06/23/2021: Pt. requires MinA to thread arms, and requires maxA to donn over upper arms, MaxAx2 to donn over  his back. 06/16/2021: Pt. has progressed  to require MinA to thread arms, and requires maxA to donn over upper arms, MaxAx2 to donn over his back; 08/16/21: min A  to thread arms, max A to don over upper arms and over head. 09/13/21: MOD A    Time 12    Period Weeks    Status On-going    Target Date 12/07/21      OT LONG TERM GOAL  #9   TITLE Pt will improve B grip strength by 10# to assist in handling ADL items    Baseline 08/16/21: bilat girp 0#; 09/13/21: L grip 6#, R grip 9#    Time 12    Period Weeks    Status Revised    Target Date 12/06/21      OT LONG TERM GOAL  #10   TITLE Pt will improve B lateral pinch strength by 8# to assist in UBD.    Baseline N/A: awaiting pinch meter to be fixed. Pt. unable to formulate proper positioning for lateral pinch assessment of the replacement pinch meter; 08/16/21: R 2#, L 1.5#; ; 09/13/21: R 6#, L 2#    Time 12    Period Weeks    Status Revised    Target Date 12/07/21                    Patient will benefit from skilled therapeutic intervention in order to improve the following deficits and impairments:           Visit Diagnosis: Muscle weakness (generalized)  Other lack of coordination    Problem List Patient Active Problem List   Diagnosis Date Noted   Sepsis due to vancomycin resistant Enterococcus species ([place]) 06/06/2019   SIRS (systemic inflammatory response syndrome) ([place]) 06/05/2019   Acute lower UTI 06/05/2019   VRE (vancomycin-resistant Enterococci) infection 06/05/2019   Anemia 06/05/2019   Skin ulcer of sacrum with necrosis of muscle ([place])    Urinary retention    Type 2 diabetes mellitus without complication, with long-term current use of insulin (HCC)    Tachycardia    Lower extremity edema    Acute metabolic encephalopathy    Obstructive sleep apnea    Morbid obesity with BMI of 60.0-69.9, adult ([place])    Goals of care, counseling/discussion    Palliative care encounter    Sepsis ([place]) 04/27/2019   H/O  insulin dependent diabetes mellitus 04/27/2019   History of CVA with residual deficit 04/27/2019   Seizure disorder ([place]) 04/27/2019   Decubitus ulcer of sacral region, stage 4 ([place]) 04/27/2019    Harrel Carina, OT 01/17/2022, [place] [address] [place], [place], [address] Phone: [phone]   Fax:  [phone]  Name: Paul Hall MRN: [id number] Date of Birth: [birth year]

## 2022-01-17 NOTE — Therapy (Incomplete)
OUTPATIENT OCCUPATIONAL THERAPY NEURO EVALUATION  Patient Name: Paul Hall MRN: [id number] DOB:1995-06-29, 26 y.o., male Today's Date: 01/17/2022  PCP: Dr. Gerarda Fraction REFERRING PROVIDER: Dr. Gerarda Fraction   OT End of Session - 01/17/22 1719     Visit Number 1    Number of Visits 12    Date for OT Re-Evaluation 03/14/2022   Authorization Type Progress report period starting 10/06/2021    OT Start Time 1605    OT Stop Time 1700    OT Time Calculation (min) 55 min    Equipment Utilized During Treatment tilt in space power wc    Activity Tolerance Patient tolerated treatment well    Behavior During Therapy WFL for tasks assessed/performed             Past Medical History:  Diagnosis Date   Diabetes mellitus ([place])    Hypertension    Stroke [facility])    Past Surgical History:  Procedure Laterality Date   IVC FILTER PLACEMENT ([place] [facility])     LEG SURGERY     PEG TUBE PLACEMENT     TRACHEOSTOMY     Patient Active Problem List   Diagnosis Date Noted   Sepsis due to vancomycin resistant Enterococcus species ([place]) 06/06/2019   SIRS (systemic inflammatory response syndrome) ([place]) 06/05/2019   Acute lower UTI 06/05/2019   VRE (vancomycin-resistant Enterococci) infection 06/05/2019   Anemia 06/05/2019   Skin ulcer of sacrum with necrosis of muscle ([place])    Urinary retention    Type 2 diabetes mellitus without complication, with long-term current use of insulin ([facility])    Tachycardia    Lower extremity edema    Acute metabolic encephalopathy    Obstructive sleep apnea    Morbid obesity with BMI of 60.0-69.9, adult ([place])    Goals of care, counseling/discussion    Palliative care encounter    Sepsis ([place]) 04/27/2019   H/O insulin dependent diabetes mellitus 04/27/2019   History of CVA with residual deficit 04/27/2019   Seizure disorder ([place]) 04/27/2019   Decubitus ulcer of sacral region, stage 4 ([place]) 04/27/2019    ONSET DATE: 01/2019  REFERRING DIAG: CVA/COVID-19  THERAPY  DIAG:  Muscle weakness (generalized)  Other lack of coordination  Rationale for Evaluation and Treatment Rehabilitation  SUBJECTIVE:   SUBJECTIVE STATEMENT: Pt. Was accompanied by his father Pt accompanied by: self and family member  PERTINENT HISTORY:  Pt. Is a 26 y.o. male who was diagnosed with COVID-19, and CVA with resultant quadriplegia. Pt. Was then hospitalized with VRE UTI. PMHx includes: urinary retention, seizure disorder, obstructive sleep disorder, DM Type II, Morbid obesity.   PRECAUTIONS: None  WEIGHT BEARING RESTRICTIONS No  PAIN:  Are you having pain? No  FALLS: Has patient fallen in last 6 months? No  LIVING ENVIRONMENT: Lives with: lives with their family Lives in: House/apartment Stairs: No Level Entry Has following equipment at home: Wheelchair (power) and hoyer lift, sit to stand lift  PLOF: Independent  PATIENT GOALS: To be able to engage in more daily care tasks.  OBJECTIVE:   HAND DOMINANCE: Right  ADLs:  Transfers/ambulation related to ADLs: Eating: Pt. Reports being able to hold standard utensils, and is starting to engage more in self-feeding tasks, hand to mouth patterns. Pt. Reports that he does as much as he can with the task, and family assists  with the remainder of the task. Grooming: Pt. Is able to initiate holding an electric toothbrush, and brush his teeth. Family assists LB Dressing: Total Assist UE  dressing: Pt. Is now able to reach up to actively assist with grasping , and pulling his gown down. Toileting:  Total Assist Bathing: MaxA UB, Total assist LB Tub Shower transfers: N/A Equipment: See above    IADLs: Shopping: Relies on family to assist Light housekeeping: Total Assist Meal Prep: Total Assist Community mobility:   Medication management:  Total Assist  Financial management: N/A Handwriting: Not legible: Pt. Is able to hold a pen with the left hand, and initiate marking the page. Pt.'s eye glasses were not  available  MOBILITY STATUS:  Power w/c  POSTURE COMMENTS:  Pt. Requires position changes in his power w/c  ACTIVITY TOLERANCE: Activity tolerance:  Fair  FUNCTIONAL OUTCOME MEASURES: FOTO: 40  TR score: 45  UPPER EXTREMITY ROM     Active ROM Right eval Left eval  Shoulder flexion 106 scaption 119  Shoulder abduction 114 110  Shoulder adduction    Shoulder extension    Shoulder internal rotation    Shoulder external rotation    Elbow flexion 120(130) 135(135)  Elbow extension -45(-35) -27(-18)  Wrist flexion    Wrist extension -30(10) 10(50  Wrist ulnar deviation    Wrist radial deviation    Wrist pronation    Wrist supination    (Blank rows = not tested)   UPPER EXTREMITY MMT:     Right eval Left eval  Shoulder flexion 3-/5 scaption 3-/5  Shoulder abduction 3-/5 3-/5  Shoulder adduction    Shoulder extension    Shoulder internal rotation    Shoulder external rotation    Elbow flexion 3/5 3/5  Elbow extension 2-/5 2-/5  Wrist flexion    Wrist extension 2-/5 2/5  Wrist ulnar deviation    Wrist radial deviation    Wrist pronation    Wrist supination    (Blank rows = not tested)    HAND FUNCTION:  Grip strength: Right: 0 lbs; Left: 0 lbs and Lateral pinch: Right: 5 lbs, Left: 2 lbs  Bilateral digit PIP/DIP flexion contractures with MP hyperextension with attempts for AROM. Pt. is able to tolerate AROM to the bilateral digits at the initial evaluation however, has a history of pain in the digits.  COORDINATION:  Pt. Is unable to grasp 9-hole test pegs. Pt. is able to initiate grasping larger pegs, and is able to hold a pen in the left hand.  SENSATION: Light touch: Impaired   EDEMA:  N/A  MUSCLE TONE: BUE flexor Spasticity  COGNITIO Overall cognitive status: Continue to assess in functional context  VISION:   Subjective report: Pt. Was not wearing glasses glasses at the time of the initial eval.  Baseline vision: Vision is very limited. Wears  glasses all the time Visual history: History of impaired vision following CVA. Pt. Has received treatment through the [facility] low vision rehabilitation program.   VISION ASSESSMENT: Impaired To be further assessed in functional context  PERCEPTION: Impaired   PRAXIS: Impaired: motor planning  OBSERVATIONS:  Pt reports being on  Tramadol   TODAY'S TREATMENT:  OT initial evaluation   PATIENT EDUCATION: Education details: OT services, POC, Goals Person educated: Patient and Parent Education method: Explanation, Demonstration, Tactile cues, and Verbal cues Education comprehension: verbalized understanding, returned demonstration, verbal cues required, and needs further education   HOME EXERCISE PROGRAM:   Continue ongoing assessment, and continue to provide as needed.     GOALS: Goals reviewed with patient? Yes  SHORT TERM GOALS: Target date: 02/14/2022    To assess splint fit, and  make appropriate adjustments to promote good skin integrity through the palmar surface of the bilateral hands.  Baseline: Bilateral resting hand splints. Goal status: INITIAL   LONG TERM GOALS: Target date: 03/14/2022     FOTO score will Improve by 2 points for Pt. perceived improvement with the assessment specific ADL/IADL tasks.  Baseline: Eval: FOTO score: 40,  TR score: 45 Goal status: INITIAL  2.   Pt. will independently perform oral care for 100% of the task after complete set-up. Baseline: Eval: Pt. is able to initiate using an electric toothbrush. Pt. requires assist for set-up, and assist for thoroughness, and as  thePt. fatigues. Goal status: INITIAL  3.  Pt. Will be independent with self-feeding for 100% of the meal after complete set-up Baseline: Eval: Pt. is able to hold standard standard utensils. Pt. Performs as much of the task as he, can and has assistance for the remainder. Goal status: INITIAL  4.  Pt. Will improve grasp patterns and consistently grasp 1/2" objects for  ADL, and IADL tasks.  Baseline: Eval: Pt. is able to grasp 1" objects intermittently using a lateral grasp pattern. Goal status: INITIAL  5.  Pt. Will legibly write one, out of 3 letters of his name legibly. Baseline: Eval: Pt is able to hold a thin marker with his left hand, and formulate a a line, and initiate a circular pattern (Pt. without glasses today) Goal status: INITIAL  6. Pt. Will reach up to comb/brush his hair  with minA.     Baseline: Pt. is able to initiate reaching up for hair care with a long handled brush, however is unable to sustain UE's in elevation to perform the task.     Goal status: INITIAL      ASSESSMENT:  CLINICAL IMPRESSION:  Patient is a 26 y.o. male who was seen today for occupational therapy evaluation for CVA, and COVID-19 affecting bilateral UE's. Pt. has returned to therapy after being discharged approximately 1 month ago. Pt. has been receiving consistent ROM from his caregivers. Pt. has received Botox injections over this past month in his wrist, and digit flexors secondary to tone, and tightness. Pt. has been attempting to engage his BUES more during ADL tasks at home now. Pt. is now reaching up to his head, and ear, however is is limited with being able to sustain his UE's in elevation for the duration of the task. Pt. is able to scratch his face, Pt. is now engaging his fingers in grasping to pull his gown down, and engaging his bilateral fingers to remove his CPAP mask. Pt.'s FOTO score is 40. Pt. will benefit from OT services to assess splint fit, improve lateral grasp patterns during daily tasks, and improve BUE functioning in order to improve engagement with ADLs, and IADL tasks, and reduce caregiver assist.   PERFORMANCE DEFICITS in functional skills including ADLs, IADLs, coordination, dexterity, proprioception, ROM, strength, pain, FMC, GMC, decreased knowledge of use of DME, and UE functional use, cognitive skills including safety awareness, and  psychosocial skills including coping strategies, environmental adaptation, habits, and routines and behaviors.   IMPAIRMENTS are limiting patient from ADLs, IADLs, leisure, and social participation.   COMORBIDITIES may have co-morbidities  that affects occupational performance. Patient will benefit from skilled OT to address above impairments and improve overall function.  MODIFICATION OR ASSISTANCE TO COMPLETE EVALUATION: Maximum or significant modification of tasks or assist is necessary to complete an evaluation.  OT OCCUPATIONAL PROFILE AND HISTORY: Comprehensive assessment: Review of records and  extensive additional review of physical, cognitive, psychosocial history related to current functional performance.  CLINICAL DECISION MAKING: High - multiple treatment options, significant modification of task necessary  REHAB POTENTIAL: Fair    EVALUATION COMPLEXITY: High    PLAN: OT FREQUENCY: 1x/week  OT DURATION:8 weeks  PLANNED INTERVENTIONS: self care/ADL training, therapeutic exercise, therapeutic activity, neuromuscular re-education, manual therapy, passive range of motion, patient/family education, and cognitive remediation/compensation  RECOMMENDED OTHER SERVICES: PT  CONSULTED AND AGREED WITH PLAN OF CARE: Patient and family member/caregiver  PLAN FOR NEXT SESSION: Initiate treatment   Olegario Messier, MS, OTR/L   Olegario Messier, OT 01/17/2022, 5:23 PM

## 2022-01-18 ENCOUNTER — Other Ambulatory Visit: Payer: BC Managed Care – PPO | Admitting: Student

## 2022-01-18 DIAGNOSIS — R52 Pain, unspecified: Secondary | ICD-10-CM

## 2022-01-18 DIAGNOSIS — Z515 Encounter for palliative care: Secondary | ICD-10-CM | POA: Diagnosis not present

## 2022-01-18 DIAGNOSIS — Z7189 Other specified counseling: Secondary | ICD-10-CM | POA: Diagnosis not present

## 2022-01-18 DIAGNOSIS — I639 Cerebral infarction, unspecified: Secondary | ICD-10-CM | POA: Diagnosis not present

## 2022-01-18 NOTE — Progress Notes (Unsigned)
[facility] Palliative Care Consult Note Telephone: [phone]  Fax: [phone]    Date of encounter: 01/18/22 3:17 PM PATIENT NAME: Paul Hall [address] [place] [address]   [phone] (home)  DOB: 16-Mar-1996 MRN: [id number] PRIMARY CARE PROVIDER:    Ginger Hall,  [place] [address] [phone]  REFERRING PROVIDER:   Ginger Hall [address] [place],  [place] [address] [phone]  RESPONSIBLE PARTY:    Contact Information     Name Relation Home Work Mobile   Paul Hall Mother [phone]  [phone]        I met face to face with patient and family in the home. Palliative Care was asked to follow this patient by consultation request of  Paul Munch, PA-C to address advance care planning and complex medical decision making. This is a follow up visit.                                   ASSESSMENT AND PLAN / RECOMMENDATIONS:   Advance Care Planning/Goals of Care: Goals include to maximize quality of life and symptom management. Patient/health care surrogate gave his/her permission to discuss. Our advance care planning conversation included a discussion about:    The value and importance of advance care planning  Experiences with loved ones who have been seriously ill or have died  Exploration of personal, cultural or spiritual beliefs that might influence medical decisions  Exploration of goals of care in the event of a sudden injury or illness  Identification of a healthcare agent  CODE STATUS:   Symptom Management/Plan:    Follow up Palliative Care Visit: Palliative care will continue to follow for complex medical decision making, advance care planning, and clarification of goals. Return *** weeks or prn.  I spent *** minutes providing this consultation. More than 50% of the time in this consultation was spent in counseling and care  coordination.  This visit was coded based on medical decision making (MDM).***  PPS: 40%  HOSPICE ELIGIBILITY/DIAGNOSIS: TBD  Chief Complaint: Palliative Medicine follow up visit.   HISTORY OF PRESENT ILLNESS:  Paul Hall is a 26 y.o. year old male  with hx of ischemic stroke, severe COVID-19 infection, T2DM, hidradenitis suppurativa. Obesity. Long-term hospitalization October 2020-January 2021; hospitalization complicated by ischemic stroke, DVT's subdural hematoma.    Patient resides at home with his parents and brother. Hoyer lift and sit to stand lift used for transfers. Using sit to stand lift for transfers. No swallowing difficulty. Now working with OT as well as PT. Encourage patient to take tramadol. Sleeping okay.   History obtained from review of EMR, discussion with primary team, and interview with family, facility staff/caregiver and/or Paul Hall.  I reviewed available labs, medications, imaging, studies and related documents from the EMR.  Records reviewed and summarized above.   ROS  *** General: NAD EYES: denies vision changes ENMT: denies dysphagia Cardiovascular: denies chest pain, denies DOE Pulmonary: denies cough, denies increased SOB Abdomen: endorses good appetite, denies constipation, endorses continence of bowel GU: denies dysuria, endorses continence of urine MSK:  denies increased weakness,  no falls reported Skin: denies rashes or wounds Neurological: denies pain, denies insomnia Psych: Endorses positive mood Heme/lymph/immuno: denies bruises, abnormal bleeding  Physical Exam: Current and past weights: Constitutional: NAD General: frail appearing, thin/WNWD/obese  EYES: anicteric sclera, lids intact, no discharge  ENMT: intact hearing, oral mucous membranes moist, dentition intact CV: S1S2, RRR, no LE edema Pulmonary: LCTA, no increased work of breathing, no cough, room air Abdomen: intake 100%, normo-active BS + 4 quadrants, soft and  non tender, no ascites GU: deferred MSK: no sarcopenia, moves all extremities, ambulatory Skin: warm and dry, no rashes or wounds on visible skin Neuro:  no generalized weakness,  no cognitive impairment Psych: non-anxious affect, A and O x 3 Hem/lymph/immuno: no widespread bruising   Thank you for the opportunity to participate in the care of Paul Hall.  The palliative care team will continue to follow. Please call our office at [phone] if we can be of additional assistance.   Paul Slocumb, NP   COVID-19 PATIENT SCREENING TOOL Asked and negative response unless otherwise noted:   Have you had symptoms of covid, tested positive or been in contact with someone with symptoms/positive test in the past 5-10 days? No

## 2022-01-19 ENCOUNTER — Ambulatory Visit: Payer: BC Managed Care – PPO | Admitting: Occupational Therapy

## 2022-01-19 ENCOUNTER — Ambulatory Visit: Payer: BC Managed Care – PPO

## 2022-01-19 DIAGNOSIS — M6281 Muscle weakness (generalized): Secondary | ICD-10-CM

## 2022-01-19 DIAGNOSIS — R2689 Other abnormalities of gait and mobility: Secondary | ICD-10-CM | POA: Diagnosis not present

## 2022-01-19 DIAGNOSIS — I6930 Unspecified sequelae of cerebral infarction: Secondary | ICD-10-CM

## 2022-01-19 DIAGNOSIS — R2681 Unsteadiness on feet: Secondary | ICD-10-CM

## 2022-01-19 DIAGNOSIS — R41841 Cognitive communication deficit: Secondary | ICD-10-CM | POA: Diagnosis not present

## 2022-01-19 DIAGNOSIS — R262 Difficulty in walking, not elsewhere classified: Secondary | ICD-10-CM

## 2022-01-19 DIAGNOSIS — R269 Unspecified abnormalities of gait and mobility: Secondary | ICD-10-CM

## 2022-01-19 DIAGNOSIS — R278 Other lack of coordination: Secondary | ICD-10-CM

## 2022-01-19 NOTE — Therapy (Signed)
OUTPATIENT PHYSICAL THERAPY TREATMENT NOTE/ Patient Name: Paul Hall MRN: [id number] DOB:Aug 29, 1995, 26 y.o., male Today's Date: 01/19/2022  PCP:  Paul Hall PROVIDER:  Collene Mares, PA-C   PT End of Session - 01/19/22 1711     Visit Number 65    Number of Visits 81    Date for PT Re-Evaluation 03/07/22    Authorization Type [employer] and [employer]- Need auth past 12th visit    Authorization Time Period 01/04/21-03/29/21; Recert 40/98/1191-4/78/2956; Recert 05/18/863- 10/10/4694; Recert 2/95/2841-32/07/4008    Progress Note Due on Visit 7    PT Start Time 1533    PT Stop Time 1600    PT Time Calculation (min) 27 min    Equipment Utilized During Treatment Gait belt   Hoyer lift   Activity Tolerance Patient tolerated treatment well   queasiness/nausea   Behavior During Therapy WFL for tasks assessed/performed               Past Medical History:  Diagnosis Date   Diabetes mellitus ([place])    Hypertension    Stroke [facility])    Past Surgical History:  Procedure Laterality Date   IVC FILTER PLACEMENT ([place] HX)     LEG SURGERY     PEG TUBE PLACEMENT     TRACHEOSTOMY     Patient Active Problem List   Diagnosis Date Noted   Sepsis due to vancomycin resistant Enterococcus species ([place]) 06/06/2019   SIRS (systemic inflammatory response syndrome) ([place]) 06/05/2019   Acute lower UTI 06/05/2019   VRE (vancomycin-resistant Enterococci) infection 06/05/2019   Anemia 06/05/2019   Skin ulcer of sacrum with necrosis of muscle ([place])    Urinary retention    Type 2 diabetes mellitus without complication, with long-term current use of insulin (HCC)    Tachycardia    Lower extremity edema    Acute metabolic encephalopathy    Obstructive sleep apnea    Morbid obesity with BMI of 60.0-69.9, adult ([place])    Goals of care, counseling/discussion    Palliative care encounter    Sepsis ([place]) 04/27/2019   H/O insulin dependent diabetes mellitus 04/27/2019   History of CVA with  residual deficit 04/27/2019   Seizure disorder ([place]) 04/27/2019   Decubitus ulcer of sacral region, stage 4 ([place]) 04/27/2019    REFERRING DIAG: Cerebral infarction, unspecified   THERAPY DIAG:  Muscle weakness (generalized)  Other lack of coordination  History of CVA with residual deficit  Difficulty in walking, not elsewhere classified  Unsteadiness on feet  Abnormality of gait and mobility  Other abnormalities of gait and mobility  Rationale for Evaluation and Treatment Rehabilitation  PERTINENT HISTORY: Paul Hall is a 25yoM who presents with severe weakness, quadriparesis, altered sensorium, and visual impairment s/p critical illness and prolonged hospitalization. Pt hospitalized in October 2020 with ARDS 2/2 COVID19 infection. Pt sustained a complex and lengthy hospitalization which included tracheostomy, prolonged sedation, ECMO. In this period pt sustained CVA and SDH. Pt has now been liberated from tracheostomy and G-tube. Pt has since been hospitalized for wound infection and UTI. Pt lives with parents at home, has hospital bed and left chair, hoyer lift transfers, and power WC for mobility needs. Pt needs heavy physical assistance with ADL 2/2 BUE contractures and motor dysfunction   PRECAUTIONS: Fall  SUBJECTIVE: Patient reports being late due to transportation. States he is agreeable to try to stand with lift today.   Patient reports PAIN:  Are you having pain? No   TODAY'S TREATMENT:   Therapeutic  Activity:    Patient arrived in power w/c- so he was dependently hoyered  +2 assist (PT and patient father) - from w/c to mat table (in seated position). Once positioned on mat table. Removed hoyer and replaced with sit to stand lift. Utilized patient pads and patient was dependently lifted using lift. He was able to keep feet on floor with knee blocked yet unable to effectively use his arm to reach for frame or arm support - yet was able to stand with glutes cleared  from seated surface- Forward flexed with poor hip ext. Patient attempted to tighten quad and glutes to improve his posture but difficult. He was able to perform 2 good trials- each lasting > 3 min.  Patient reported overall fatigue and was positioned back to sitting and then switched lifts back to hoyer and positioned patient back to w/c    Education provided throughout session via VC/TC and demonstration to facilitate movement at target joints and correct muscle activation for all testing and exercises performed.       PATIENT EDUCATION: Education details: posture cues with sitting Person educated: Patient Education method: Explanation, Demonstration, Tactile cues, and Verbal cues Education comprehension: verbalized understanding, returned demonstration, verbal cues required, tactile cues required, and needs further education   HOME EXERCISE PROGRAM:    PT Short Term Goals -       PT SHORT TERM GOAL #1   Title Pt will be independent with HEP in order to improve strength and balance in order to decrease fall risk and improve function at home and work.    Baseline 01/04/2021= No formal HEP in place; 12/12 no HEP in place; 05/10/2021-Patient and his father were able to report compliance with curent HEP consisting of mostly seated/reclined LE strengthening. Both verbalize no questions at this time.    Time 6    Period Weeks    Status Achieved    Target Date 02/15/21              PT Long Term Goals -      PT LONG TERM GOAL #1   Title Patient will increase BLE gross strength by 1/2 muscle grade to improve functional strength for improved independence with potential gait, increased standing tolerance and increased ADL ability.    Baseline 01/04/2021- Patient presents with 1/5 to 3-/5 B LE strength with MMT; 12/12: goal partially met for Left knee/hip; 05/10/2021= 2-/5 bilateal Hip flex; 3+/5 bilateral Knee ext; 06/21/2021= Patient presents with 2-/5 bilateral Hip flex; 3+/5 bilateral knee  ext/flex; 2-/5 left ankle DF; 0/5 right ankle- and able to increase reps and resistance with LE's. 09/15/2021- Patient technically presents with 2-/5 B hip flex/abd/add - but he is able to raise his hip up to approx 100 deg which has improved. 3+/5 Bilateral knee ext, 2-/5 left ankle and 0/5 right ankle. 12/08/2021= Patient able to lift left knee at 110 deg of hip flex; presents with 3+/5 knee ext, 2-/5 left ankle DF and 0/5 right ankle DF, 2-/5 bilateral Hip abd in seated position.    Time 12    Period Weeks    Status On-going    Target Date 03/07/2022     PT LONG TERM GOAL #2   Title Patient will tolerate sitting unsupported demonstrating erect sitting posture for 15 minutes with CGA to demonstrate improved back extensor strength and improved sitting tolerance.    Baseline 01/04/2021- Patient confied to sitting in lift chair or electric power chair with back support and unable to  sit upright without physical assistance; 12/12: tolerates <1 minutes upright unsupported sitting. 05/10/2021=static sit with forward trunk lean  in his power wheelchair without back support x approx 3 min. 06/21/2021=Unable to assess today due to patient with acute back pain but on previous visit able to sit x 8 min without back support. 09/15/2021- on last visit- 09/13/2021- patient was able to sit unsupported x 8 min at edge of mat. 10/13/2023 - Patient was able to sit at edge of mat with varying level of assist today from SBA to min A for a total of 20 min. 12/13/2021= Patient demonstrated unsupported sitting at edge of mat for approx 20 min   Time 12    Period Weeks    Status GOAL MET   Target Date 12/08/21      PT LONG TERM GOAL #3   Title Patient will demonstrate ability to perform static standing in // bars > 2 min with Max Assist  without loss of balance and fair posture for improved overall strength for pre-gait and transfer activities.    Baseline 01/04/2021= Patient current uanble to stand- Dependent on hoyer or sit to stand  lift for transfers. 05/10/2021=Not appropriate yet- Currently still dependent with all transfers using hoyer. 06/21/2021= Patient continuing now to focus on LE strengthening to prepare for standing-unable to try today due to acute low back pain-  planning on attempting in new cert period. 09/15/2021- Patient has attempted standing 2x in past two week- max Assist of 2 people - only once was he successful to clearing his bottom from chair - Will continue to be a focus during the new certification. 12/13/2021= Patient has been limited secondary to increased overall low back pain during this certification and will require more time to focus on this goal.    Time 12    Period Weeks    Status On-going    Target Date 03/07/2022     PT LONG TERM GOAL #4   Title Pt will improve FOTO score by 10 points or more demonstrating improved perceived functional ability    Baseline FOTO 7 on 10/17; 03/15/21: FOTO 12; 05/10/2021 06/21/2021= 1; 09/15/2021= 9; 12/13/2021= Will issue next visit    Time 12    Period Weeks    Status On-going    Target Date 03/07/2022     PT LONG TERM GOAL #5   Title Patient will perform sit to stand transfer with appropriate AD and max assist of 2 people with 75% consistency to prepare for pregait activities.    Baseline 09/15/2021= Patient unable to stand well- unable to clear his bottom off chair with Max assist of 2 persons. 12/13/2021- Goal not appropriate to try yet but will keep and roll over to next cert as shift continues to focus on transfers/standing   Time 12    Period Weeks    Status New    Target Date 03/07/2022   PT LONG TERM GOAL #6  Title Patient will tolerate sitting unsupported demonstrating erect sitting posture for 30 minutes with CGA to demonstrate improved back extensor strength and improved sitting tolerance.   Baseline 12/13/2021= Patient demonstrated unsupported sitting at edge of mat for approx 20 min;  12/29/2021- Patient performed approx 30 min of dynamic sitting  activities today.   Time 12   Period Weeks   Status ONGOING  Target Date 03/07/2022            Plan     Clinical Impression Statement Treatment limited secondary to patient being >  15 min late due to transportation issues. He arrived in good spirits and agreeable to stand today. He performed fairly today- able to bear weight through feet and knee with some report of soreness yet unable to extend hips or effectively use his arms to assist. Will continue to work in standing position and work on ways to incorporate more UE use. Patient will benefit from additional skilled PT intervention to improve strength, balance, and mobility for improved quality of life.    Personal Factors and Comorbidities Comorbidity 3+;Time since onset of injury/illness/exacerbation    Comorbidities CVA, diabetes, Seizures    Examination-Activity Limitations Bathing;Bed Mobility;Bend;Caring for Others;Carry;Dressing;Hygiene/Grooming;Lift;Locomotion Level;Reach Overhead;Self Feeding;Sit;Squat;Stairs;Stand;Transfers;Toileting    Examination-Participation Restrictions Cleaning;Community Activity;Driving;Laundry;Medication Management;Meal Prep;Occupation;Personal Finances;Shop;Yard Work;Volunteer    Stability/Clinical Decision Making Evolving/Moderate complexity    Rehab Potential Fair    PT Frequency 2x / week    PT Duration 12 weeks    PT Treatment/Interventions ADLs/Self Care Home Management;Cryotherapy;Electrical Stimulation;Moist Heat;Ultrasound;DME Instruction;Gait training;Stair training;Functional mobility training;Therapeutic exercise;Balance training;Patient/family education;Orthotic Fit/Training;Neuromuscular re-education;Wheelchair mobility training;Manual techniques;Passive range of motion;Dry needling;Energy conservation;Taping;Visual/perceptual remediation/compensation;Joint Manipulations    PT Next Visit Plan core strength- sitting at EOM, postural control, sabina sit to stand if able; Continue with  progressive LE Strengthening    PT Home Exercise Plan No changes to HEP today    Consulted and Agree with Plan of Care Patient;Family member/caregiver    Family Member Consulted Dad               Lewis Moccasin, PT 01/19/2022, 5:16 PM

## 2022-01-20 DIAGNOSIS — G4733 Obstructive sleep apnea (adult) (pediatric): Secondary | ICD-10-CM | POA: Diagnosis not present

## 2022-01-23 DIAGNOSIS — L8994 Pressure ulcer of unspecified site, stage 4: Secondary | ICD-10-CM | POA: Diagnosis not present

## 2022-01-23 DIAGNOSIS — I6389 Other cerebral infarction: Secondary | ICD-10-CM | POA: Diagnosis not present

## 2022-01-23 DIAGNOSIS — R32 Unspecified urinary incontinence: Secondary | ICD-10-CM | POA: Diagnosis not present

## 2022-01-24 ENCOUNTER — Ambulatory Visit: Payer: BC Managed Care – PPO

## 2022-01-24 DIAGNOSIS — R2681 Unsteadiness on feet: Secondary | ICD-10-CM | POA: Diagnosis not present

## 2022-01-24 DIAGNOSIS — R278 Other lack of coordination: Secondary | ICD-10-CM

## 2022-01-24 DIAGNOSIS — R262 Difficulty in walking, not elsewhere classified: Secondary | ICD-10-CM

## 2022-01-24 DIAGNOSIS — I6930 Unspecified sequelae of cerebral infarction: Secondary | ICD-10-CM

## 2022-01-24 DIAGNOSIS — R2689 Other abnormalities of gait and mobility: Secondary | ICD-10-CM | POA: Diagnosis not present

## 2022-01-24 DIAGNOSIS — M6281 Muscle weakness (generalized): Secondary | ICD-10-CM

## 2022-01-24 DIAGNOSIS — R269 Unspecified abnormalities of gait and mobility: Secondary | ICD-10-CM | POA: Diagnosis not present

## 2022-01-24 DIAGNOSIS — R41841 Cognitive communication deficit: Secondary | ICD-10-CM | POA: Diagnosis not present

## 2022-01-25 NOTE — Therapy (Signed)
OUTPATIENT PHYSICAL THERAPY TREATMENT NOTE/ Patient Name: Paul Hall MRN: [id number] DOB:09-23-1995, 26 y.o., male Today's Date: 01/25/2022  PCP:  Loistine Chance PROVIDER:  Collene Mares, PA-C   PT End of Session - 01/24/22 0809     Visit Number 75    Number of Visits 81    Date for PT Re-Evaluation 03/07/22    Authorization Type [employer] and [employer] Medicaid- Need auth past 12th visit    Authorization Time Period 01/04/21-03/29/21; Recert 28/36/6294-7/65/4650; Recert 3/54/6568- 04/06/7515; Recert 0/04/7492-49/09/7589    Progress Note Due on Visit 48    PT Start Time 1517    PT Stop Time 1558    PT Time Calculation (min) 41 min    Equipment Utilized During Treatment Gait belt   Hoyer lift   Activity Tolerance Patient tolerated treatment well   queasiness/nausea   Behavior During Therapy WFL for tasks assessed/performed               Past Medical History:  Diagnosis Date   Diabetes mellitus ([place])    Hypertension    Stroke [facility])    Past Surgical History:  Procedure Laterality Date   IVC FILTER PLACEMENT ([place] [facility]X)     LEG SURGERY     PEG TUBE PLACEMENT     TRACHEOSTOMY     Patient Active Problem List   Diagnosis Date Noted   Sepsis due to vancomycin resistant Enterococcus species ([place]) 06/06/2019   SIRS (systemic inflammatory response syndrome) ([place]) 06/05/2019   Acute lower UTI 06/05/2019   VRE (vancomycin-resistant Enterococci) infection 06/05/2019   Anemia 06/05/2019   Skin ulcer of sacrum with necrosis of muscle ([place])    Urinary retention    Type 2 diabetes mellitus without complication, with long-term current use of insulin (H[facility])    Tachycardia    Lower extremity edema    Acute metabolic encephalopathy    Obstructive sleep apnea    Morbid obesity with BMI of 60.0-69.9, adult ([place])    Goals of care, counseling/discussion    Palliative care encounter    Sepsis ([place]) 04/27/2019   H/O insulin dependent diabetes mellitus 04/27/2019   History of CVA with  residual deficit 04/27/2019   Seizure disorder ([place]) 04/27/2019   Decubitus ulcer of sacral region, stage 4 ([place]) 04/27/2019    REFERRING DIAG: Cerebral infarction, unspecified   THERAPY DIAG:  Muscle weakness (generalized)  Other lack of coordination  History of CVA with residual deficit  Difficulty in walking, not elsewhere classified  Unsteadiness on feet  Abnormality of gait and mobility  Other abnormalities of gait and mobility  Cognitive communication deficit  Rationale for Evaluation and Treatment Rehabilitation  PERTINENT HISTORY: Paul Hall is a 25yoM who presents with severe weakness, quadriparesis, altered sensorium, and visual impairment s/p critical illness and prolonged hospitalization. Pt hospitalized in October 2020 with ARDS 2/2 COVID19 infection. Pt sustained a complex and lengthy hospitalization which included tracheostomy, prolonged sedation, ECMO. In this period pt sustained CVA and SDH. Pt has now been liberated from tracheostomy and G-tube. Pt has since been hospitalized for wound infection and UTI. Pt lives with parents at home, has hospital bed and left chair, hoyer lift transfers, and power WC for mobility needs. Pt needs heavy physical assistance with ADL 2/2 BUE contractures and motor dysfunction   PRECAUTIONS: Fall  SUBJECTIVE: Patient reports doing well with no ill effects from last session. He reports he has been standing some more at home with use of lift and his family.   Patient reports  PAIN:  Are you having pain? No   TODAY'S TREATMENT:   Therapeutic Activity:   Transfer from power w/c to Mat table (in slightly reclined seated position) - Activity performed via [name] +2 assist. Some difficulty lowering patient back far enough requiring some physical assist to pull back on Hoyer pad to position into sitting position prior to lowering completely. Once in seated position- [name] removed and replaced with sit to stand lift- Pads donned and  patient was lifted x 3 trials today with PT on one side and patient father on opp side. Patient was positioned into standing slowly- his arms were supported on both sides. He exhibited fair tolerance with standing erect requiring VC/TC to keep glutes engaged. He was able to contract on his own and able to stand for 3 periods- ranging from 45 sec to 2 min 12 seconds.  Patient reported overall fatigue and was positioned back to sitting and then switched lifts back to hoyer and positioned patient back to w/c    Education provided throughout session via VC/TC and demonstration to facilitate movement at target joints and correct muscle activation for all testing and exercises performed.       PATIENT EDUCATION: Education details: posture cues with sitting Person educated: Patient Education method: Explanation, Demonstration, Tactile cues, and Verbal cues Education comprehension: verbalized understanding, returned demonstration, verbal cues required, tactile cues required, and needs further education   HOME EXERCISE PROGRAM:    PT Short Term Goals -       PT SHORT TERM GOAL #1   Title Pt will be independent with HEP in order to improve strength and balance in order to decrease fall risk and improve function at home and work.    Baseline 01/04/2021= No formal HEP in place; 12/12 no HEP in place; 05/10/2021-Patient and his father were able to report compliance with curent HEP consisting of mostly seated/reclined LE strengthening. Both verbalize no questions at this time.    Time 6    Period Weeks    Status Achieved    Target Date 02/15/21              PT Long Term Goals -      PT LONG TERM GOAL #1   Title Patient will increase BLE gross strength by 1/2 muscle grade to improve functional strength for improved independence with potential gait, increased standing tolerance and increased ADL ability.    Baseline 01/04/2021- Patient presents with 1/5 to 3-/5 B LE strength with MMT; 12/12: goal  partially met for Left knee/hip; 05/10/2021= 2-/5 bilateal Hip flex; 3+/5 bilateral Knee ext; 06/21/2021= Patient presents with 2-/5 bilateral Hip flex; 3+/5 bilateral knee ext/flex; 2-/5 left ankle DF; 0/5 right ankle- and able to increase reps and resistance with LE's. 09/15/2021- Patient technically presents with 2-/5 B hip flex/abd/add - but he is able to raise his hip up to approx 100 deg which has improved. 3+/5 Bilateral knee ext, 2-/5 left ankle and 0/5 right ankle. 12/08/2021= Patient able to lift left knee at 110 deg of hip flex; presents with 3+/5 knee ext, 2-/5 left ankle DF and 0/5 right ankle DF, 2-/5 bilateral Hip abd in seated position.    Time 12    Period Weeks    Status On-going    Target Date 03/07/2022     PT LONG TERM GOAL #2   Title Patient will tolerate sitting unsupported demonstrating erect sitting posture for 15 minutes with CGA to demonstrate improved back extensor strength and improved sitting  tolerance.    Baseline 01/04/2021- Patient confied to sitting in lift chair or electric power chair with back support and unable to sit upright without physical assistance; 12/12: tolerates <1 minutes upright unsupported sitting. 05/10/2021=static sit with forward trunk lean  in his power wheelchair without back support x approx 3 min. 06/21/2021=Unable to assess today due to patient with acute back pain but on previous visit able to sit x 8 min without back support. 09/15/2021- on last visit- 09/13/2021- patient was able to sit unsupported x 8 min at edge of mat. 10/13/2023 - Patient was able to sit at edge of mat with varying level of assist today from SBA to min A for a total of 20 min. 12/13/2021= Patient demonstrated unsupported sitting at edge of mat for approx 20 min   Time 12    Period Weeks    Status GOAL MET   Target Date 12/08/21      PT LONG TERM GOAL #3   Title Patient will demonstrate ability to perform static standing in // bars > 2 min with Max Assist  without loss of balance and  fair posture for improved overall strength for pre-gait and transfer activities.    Baseline 01/04/2021= Patient current uanble to stand- Dependent on hoyer or sit to stand lift for transfers. 05/10/2021=Not appropriate yet- Currently still dependent with all transfers using hoyer. 06/21/2021= Patient continuing now to focus on LE strengthening to prepare for standing-unable to try today due to acute low back pain-  planning on attempting in new cert period. 09/15/2021- Patient has attempted standing 2x in past two week- max Assist of 2 people - only once was he successful to clearing his bottom from chair - Will continue to be a focus during the new certification. 12/13/2021= Patient has been limited secondary to increased overall low back pain during this certification and will require more time to focus on this goal.    Time 12    Period Weeks    Status On-going    Target Date 03/07/2022     PT LONG TERM GOAL #4   Title Pt will improve FOTO score by 10 points or more demonstrating improved perceived functional ability    Baseline FOTO 7 on 10/17; 03/15/21: FOTO 12; 05/10/2021 06/21/2021= 1; 09/15/2021= 9; 12/13/2021= Will issue next visit    Time 12    Period Weeks    Status On-going    Target Date 03/07/2022     PT LONG TERM GOAL #5   Title Patient will perform sit to stand transfer with appropriate AD and max assist of 2 people with 75% consistency to prepare for pregait activities.    Baseline 09/15/2021= Patient unable to stand well- unable to clear his bottom off chair with Max assist of 2 persons. 12/13/2021- Goal not appropriate to try yet but will keep and roll over to next cert as shift continues to focus on transfers/standing   Time 12    Period Weeks    Status New    Target Date 03/07/2022   PT LONG TERM GOAL #6  Title Patient will tolerate sitting unsupported demonstrating erect sitting posture for 30 minutes with CGA to demonstrate improved back extensor strength and improved sitting  tolerance.   Baseline 12/13/2021= Patient demonstrated unsupported sitting at edge of mat for approx 20 min;  12/29/2021- Patient performed approx 30 min of dynamic sitting activities today.   Time 12   Period Weeks   Status ONGOING  Target Date 03/07/2022  Plan     Clinical Impression Statement Patient performed well with overall Standing and transfers today- Able to incorporate hoyer and sit to stand lift more efficiently today to provide safe environment to attempt standing. Patient presents with limited standing endurance and will continue to benefit from continued training. Patient will benefit from additional skilled PT intervention to improve strength, balance, and mobility for improved quality of life.    Personal Factors and Comorbidities Comorbidity 3+;Time since onset of injury/illness/exacerbation    Comorbidities CVA, diabetes, Seizures    Examination-Activity Limitations Bathing;Bed Mobility;Bend;Caring for Others;Carry;Dressing;Hygiene/Grooming;Lift;Locomotion Level;Reach Overhead;Self Feeding;Sit;Squat;Stairs;Stand;Transfers;Toileting    Examination-Participation Restrictions Cleaning;Community Activity;Driving;Laundry;Medication Management;Meal Prep;Occupation;Personal Finances;Shop;Yard Work;Volunteer    Stability/Clinical Decision Making Evolving/Moderate complexity    Rehab Potential Fair    PT Frequency 2x / week    PT Duration 12 weeks    PT Treatment/Interventions ADLs/Self Care Home Management;Cryotherapy;Electrical Stimulation;Moist Heat;Ultrasound;DME Instruction;Gait training;Stair training;Functional mobility training;Therapeutic exercise;Balance training;Patient/family education;Orthotic Fit/Training;Neuromuscular re-education;Wheelchair mobility training;Manual techniques;Passive range of motion;Dry needling;Energy conservation;Taping;Visual/perceptual remediation/compensation;Joint Manipulations    PT Next Visit Plan core strength- sitting at EOM,  postural control, sabina sit to stand if able; Continue with progressive LE Strengthening    PT Home Exercise Plan No changes to HEP today    Consulted and Agree with Plan of Care Patient;Family member/caregiver    Family Member Consulted Dad               Lewis Moccasin, PT 01/25/2022, 8:10 AM

## 2022-01-26 ENCOUNTER — Ambulatory Visit: Payer: BC Managed Care – PPO | Admitting: Occupational Therapy

## 2022-01-26 ENCOUNTER — Ambulatory Visit: Payer: BC Managed Care – PPO

## 2022-01-26 DIAGNOSIS — R278 Other lack of coordination: Secondary | ICD-10-CM

## 2022-01-26 DIAGNOSIS — R262 Difficulty in walking, not elsewhere classified: Secondary | ICD-10-CM | POA: Diagnosis not present

## 2022-01-26 DIAGNOSIS — M6281 Muscle weakness (generalized): Secondary | ICD-10-CM | POA: Diagnosis not present

## 2022-01-26 DIAGNOSIS — R2681 Unsteadiness on feet: Secondary | ICD-10-CM | POA: Diagnosis not present

## 2022-01-26 DIAGNOSIS — R2689 Other abnormalities of gait and mobility: Secondary | ICD-10-CM | POA: Diagnosis not present

## 2022-01-26 DIAGNOSIS — R269 Unspecified abnormalities of gait and mobility: Secondary | ICD-10-CM | POA: Diagnosis not present

## 2022-01-26 DIAGNOSIS — R41841 Cognitive communication deficit: Secondary | ICD-10-CM | POA: Diagnosis not present

## 2022-01-26 DIAGNOSIS — I6930 Unspecified sequelae of cerebral infarction: Secondary | ICD-10-CM

## 2022-01-26 NOTE — Therapy (Signed)
OUTPATIENT OCCUPATIONAL THERAPY TREATMENT  Patient Name: Paul Hall MRN: [id number] DOB:1995-12-03, 26 y.o., male Today's Date: 01/26/2022  PCP: Dr. Sherwood Gambler REFERRING PROVIDER: Dr. Sherwood Gambler   OT End of Session - 01/26/22 1654     Visit Number 2    Number of Visits 12    Date for OT Re-Evaluation 04/11/22    Authorization Type Progress report period starting 10/06/2021    OT Start Time 1430    OT Stop Time 1515    OT Time Calculation (min) 45 min    Activity Tolerance Patient tolerated treatment well    Behavior During Therapy WFL for tasks assessed/performed               Past Medical History:  Diagnosis Date   Diabetes mellitus (HCC)    Hypertension    Stroke [facility])    Past Surgical History:  Procedure Laterality Date   IVC FILTER PLACEMENT ([facility] HX)     LEG SURGERY     PEG TUBE PLACEMENT     TRACHEOSTOMY     Patient Active Problem List   Diagnosis Date Noted   Sepsis due to vancomycin resistant Enterococcus species (HCC) 06/06/2019   SIRS (systemic inflammatory response syndrome) (HCC) 06/05/2019   Acute lower UTI 06/05/2019   VRE (vancomycin-resistant Enterococci) infection 06/05/2019   Anemia 06/05/2019   Skin ulcer of sacrum with necrosis of muscle (HCC)    Urinary retention    Type 2 diabetes mellitus without complication, with long-term current use of insulin (HCC)    Tachycardia    Lower extremity edema    Acute metabolic encephalopathy    Obstructive sleep apnea    Morbid obesity with BMI of 60.0-69.9, adult (HCC)    Goals of care, counseling/discussion    Palliative care encounter    Sepsis (HCC) 04/27/2019   H/O insulin dependent diabetes mellitus 04/27/2019   History of CVA with residual deficit 04/27/2019   Seizure disorder (HCC) 04/27/2019   Decubitus ulcer of sacral region, stage 4 (HCC) 04/27/2019    ONSET DATE: 01/2019  REFERRING DIAG: CVA/COVID-19  THERAPY DIAG:  Muscle weakness (generalized)  Other lack of  coordination  Rationale for Evaluation and Treatment Rehabilitation  SUBJECTIVE:   Patient reports that he heard a pop in his wrist during range of motion at home.  Patient reports that it did not hurt and actually felt good to him.  SUBJECTIVE STATEMENT: Pt. Was accompanied by his father Pt accompanied by: self and family member  PERTINENT HISTORY:  Pt. Is a 26 y.o. male who was diagnosed with COVID-19, and CVA with resultant quadriplegia. Pt. Was then hospitalized with VRE UTI. PMHx includes: urinary retention, seizure disorder, obstructive sleep disorder, DM Type II, Morbid obesity.   PRECAUTIONS: None  WEIGHT BEARING RESTRICTIONS No  PAIN:  Are you having pain? No  FALLS: Has patient fallen in last 6 months? No  LIVING ENVIRONMENT: Lives with: lives with their family Lives in: House/apartment Stairs: No Level Entry Has following equipment at home: Wheelchair (power) and hoyer lift, sit to stand lift  PLOF: Independent  PATIENT GOALS: To be able to engage in more daily care tasks.  OBJECTIVE:   HAND DOMINANCE: Right  ADLs:  Transfers/ambulation related to ADLs: Eating: Pt. Reports being able to hold standard utensils, and is starting to engage more in self-feeding tasks, hand to mouth patterns. Pt. Reports that he does as much as he can with the task, and family assists  with the remainder of the task.  Grooming: Pt. Is able to initiate holding an electric toothbrush, and brush his teeth. Family assists LB Dressing: Total Assist UE dressing: Pt. Is now able to reach up to actively assist with grasping , and pulling his gown down. Toileting:  Total Assist Bathing: MaxA UB, Total assist LB Tub Shower transfers: N/A Equipment: See above    IADLs: Shopping: Relies on family to assist Light housekeeping: Total Assist Meal Prep: Total Assist Community mobility:   Medication management:  Total Assist  Financial management: N/A Handwriting: Not legible: Pt. Is able  to hold a pen with the left hand, and initiate marking the page. Pt.'s eye glasses were not available  MOBILITY STATUS:  Power w/c  POSTURE COMMENTS:  Pt. Requires position changes in his power w/c  ACTIVITY TOLERANCE: Activity tolerance:  Fair  FUNCTIONAL OUTCOME MEASURES: FOTO: 40  TR score: 45  UPPER EXTREMITY ROM     Active ROM Right eval Left eval  Shoulder flexion 106 scaption 119  Shoulder abduction 114 110  Shoulder adduction    Shoulder extension    Shoulder internal rotation    Shoulder external rotation    Elbow flexion 120(130) 135(135)  Elbow extension -45(-35) -27(-18)  Wrist flexion    Wrist extension -30(10) 10(50  Wrist ulnar deviation    Wrist radial deviation    Wrist pronation    Wrist supination    (Blank rows = not tested)   UPPER EXTREMITY MMT:     Right eval Left eval  Shoulder flexion 3-/5 scaption 3-/5  Shoulder abduction 3-/5 3-/5  Shoulder adduction    Shoulder extension    Shoulder internal rotation    Shoulder external rotation    Elbow flexion 3/5 3/5  Elbow extension 2-/5 2-/5  Wrist flexion    Wrist extension 2-/5 2/5  Wrist ulnar deviation    Wrist radial deviation    Wrist pronation    Wrist supination    (Blank rows = not tested)    HAND FUNCTION:  Grip strength: Right: 0 lbs; Left: 0 lbs and Lateral pinch: Right: 5 lbs, Left: 2 lbs  Bilateral digit PIP/DIP flexion contractures with MP hyperextension with attempts for AROM. Pt. is able to tolerate AROM to the bilateral digits at the initial evaluation however, has a history of pain in the digits.  COORDINATION:  Pt. Is unable to grasp 9-hole test pegs. Pt. is able to initiate grasping larger pegs, and is able to hold a pen in the left hand.  SENSATION: Light touch: Impaired   EDEMA:  N/A  MUSCLE TONE: BUE flexor Spasticity  COGNITIO Overall cognitive status: Continue to assess in functional context  VISION:   Subjective report: Pt. Was not wearing  glasses glasses at the time of the initial eval.  Baseline vision: Vision is very limited. Wears glasses all the time Visual history: History of impaired vision following CVA. Pt. Has received treatment through the [facility] low vision rehabilitation program.   VISION ASSESSMENT: Impaired To be further assessed in functional context  PERCEPTION: Impaired   PRAXIS: Impaired: motor planning  OBSERVATIONS:  Pt reports being on  Tramadol   TODAY'S TREATMENT:   Therapeutic exercise:  Patient worked on bilateral upper extremity active range of motion for shoulder flexion, abduction, and horizontal abduction, followed by using a 1# weight on the left upper extremity. Patient worked on reaching up towards the top of his head the back of his head and his forehead preparation for hair care and grooming. Patient worked on  horizontal abduction reps with the left upper extremity in preparation for increasing strength needed for sustaining his left upper extremity and motion while brushing his teeth.   Orthotic fitting:  Splint fit was assessed for the bilateral hands with an adjustment made to accommodate right wrist flexion.  Neuromuscular reeducation:  Patient worked on left hand grasp patterns.  Patient worked on grasping half an inch thick pegs in multiple planes, and vertical angles for the left upper extremity    .  PATIENT EDUCATION: Education details: OT services, POC, Goals Person educated: Patient and Parent Education method: Explanation, Demonstration, Tactile cues, and Verbal cues Education comprehension: verbalized understanding, returned demonstration, verbal cues required, and needs further education   HOME EXERCISE PROGRAM:   Continue ongoing assessment, and continue to provide as needed.     GOALS: Goals reviewed with patient? Yes  SHORT TERM GOALS: Target date: 02/14/2022    To assess splint fit, and make appropriate adjustments to promote good skin integrity through  the palmar surface of the bilateral hands.  Baseline: Bilateral resting hand splints. Goal status: INITIAL   LONG TERM GOALS: Target date: 03/14/2022     FOTO score will Improve by 2 points for Pt. perceived improvement with the assessment specific ADL/IADL tasks.  Baseline: Eval: FOTO score: 40,  TR score: 45 Goal status: INITIAL  2.   Pt. will independently perform oral care for 100% of the task after complete set-up. Baseline: Eval: Pt. is able to initiate using an electric toothbrush. Pt. requires assist for set-up, and assist for thoroughness, and as  thePt. fatigues. Goal status: INITIAL  3.  Pt. Will be independent with self-feeding for 100% of the meal after complete set-up Baseline: Eval: Pt. is able to hold standard standard utensils. Pt. Performs as much of the task as he, can and has assistance for the remainder. Goal status: INITIAL  4.  Pt. Will improve grasp patterns and consistently grasp 1/2" objects for ADL, and IADL tasks.  Baseline: Eval: Pt. is able to grasp 1" objects intermittently using a lateral grasp pattern. Goal status: INITIAL  5.  Pt. Will legibly write one, out of 3 letters of his name legibly. Baseline: Eval: Pt is able to hold a thin marker with his left hand, and formulate a a line, and initiate a circular pattern (Pt. without glasses today) Goal status: INITIAL  6. Pt. Will reach up to comb/brush his hair  with minA.     Baseline: Pt. is able to initiate reaching up for hair care with a long handled brush, however is unable to sustain UE's in elevation to perform the task.     Goal status: INITIAL      ASSESSMENT:  CLINICAL IMPRESSION:  Patient was able to tolerate the splint modification to accommodate right wrist flexor tightness. Patient reports wearing the splints for approximately one hour at a time at home.  Patient was able to reach in multiple planes for multiple reps with his left upper extremity with support at the elbow and visual  cues.  Patient tolerated upper extremity exercises well however required rest breaks.  Patient was able to use his left hand to accurately grasp half inch wide pegs on the protocol pegboard with visual cues.  Patient required extensive assist with the right. Patient education was provided about additional ways to use the pegboard to promote grasping with upper extremity reaching at home. pt. continues to work on improving lateral grasp patterns during daily tasks, and improve BUE functioning in  order to improve engagement with ADLs, and IADL tasks, and reduce caregiver assist.   PERFORMANCE DEFICITS in functional skills including ADLs, IADLs, coordination, dexterity, proprioception, ROM, strength, pain, FMC, GMC, decreased knowledge of use of DME, and UE functional use, cognitive skills including safety awareness, and psychosocial skills including coping strategies, environmental adaptation, habits, and routines and behaviors.   IMPAIRMENTS are limiting patient from ADLs, IADLs, leisure, and social participation.   COMORBIDITIES may have co-morbidities  that affects occupational performance. Patient will benefit from skilled OT to address above impairments and improve overall function.  MODIFICATION OR ASSISTANCE TO COMPLETE EVALUATION: Maximum or significant modification of tasks or assist is necessary to complete an evaluation.  OT OCCUPATIONAL PROFILE AND HISTORY: Comprehensive assessment: Review of records and extensive additional review of physical, cognitive, psychosocial history related to current functional performance.  CLINICAL DECISION MAKING: High - multiple treatment options, significant modification of task necessary  REHAB POTENTIAL: Fair    EVALUATION COMPLEXITY: High    PLAN: OT FREQUENCY: 1x/week  OT DURATION:8 weeks  PLANNED INTERVENTIONS: self care/ADL training, therapeutic exercise, therapeutic activity, neuromuscular re-education, manual therapy, passive range of  motion, patient/family education, and cognitive remediation/compensation  RECOMMENDED OTHER SERVICES: PT  CONSULTED AND AGREED WITH PLAN OF CARE: Patient and family member/caregiver  PLAN FOR NEXT SESSION: Initiate treatment   Olegario Messier, MS, OTR/L   Olegario Messier, OT 01/26/2022, 4:59 PM

## 2022-01-27 NOTE — Therapy (Signed)
OUTPATIENT PHYSICAL THERAPY TREATMENT NOTE Patient Name: Paul Hall MRN: [id number] DOB:1995/06/17, 26 y.o., male Today's Date: 01/27/2022  PCP:  Paul Hall PROVIDER:  Collene Mares, PA-C   PT End of Session - 01/26/22 0738     Visit Number 24    Number of Visits 81    Date for PT Re-Evaluation 03/07/22    Authorization Type [employer] and [employer]- Need auth past 12th visit    Authorization Time Period 01/04/21-03/29/21; Recert [id number]/[id number]/[id number]/5449; Recert 05/05/69- 05/05/9756; Recert 8/32/5498-26/07/1581    Progress Note Due on Visit 26    PT Start Time 1516    PT Stop Time 1558    PT Time Calculation (min) 42 min    Equipment Utilized During Treatment Gait belt   Hoyer lift   Activity Tolerance Patient tolerated treatment well   queasiness/nausea   Behavior During Therapy WFL for tasks assessed/performed               Past Medical History:  Diagnosis Date   Diabetes mellitus ([place])    Hypertension    Stroke ([place])    Past Surgical History:  Procedure Laterality Date   IVC FILTER PLACEMENT ([place] HX)     LEG SURGERY     PEG TUBE PLACEMENT     TRACHEOSTOMY     Patient Active Problem List   Diagnosis Date Noted   Sepsis due to vancomycin resistant Enterococcus species ([place]) 06/06/2019   SIRS (systemic inflammatory response syndrome) ([place]) 06/05/2019   Acute lower UTI 06/05/2019   VRE (vancomycin-resistant Enterococci) infection 06/05/2019   Anemia 06/05/2019   Skin ulcer of sacrum with necrosis of muscle ([place])    Urinary retention    Type 2 diabetes mellitus without complication, with long-term current use of insulin (HCC)    Tachycardia    Lower extremity edema    Acute metabolic encephalopathy    Obstructive sleep apnea    Morbid obesity with BMI of 60.0-69.9, adult ([place])    Goals of care, counseling/discussion    Palliative care encounter    Sepsis ([place]) 04/27/2019   H/O insulin dependent diabetes mellitus 04/27/2019   History of CVA with  residual deficit 04/27/2019   Seizure disorder ([place]) 04/27/2019   Decubitus ulcer of sacral region, stage 4 ([place]) 04/27/2019    REFERRING DIAG: Cerebral infarction, unspecified   THERAPY DIAG:  Muscle weakness (generalized)  Other lack of coordination  History of CVA with residual deficit  Difficulty in walking, not elsewhere classified  Unsteadiness on feet  Abnormality of gait and mobility  Rationale for Evaluation and Treatment Rehabilitation  PERTINENT HISTORY: Paul Hall is a 25yoM who presents with severe weakness, quadriparesis, altered sensorium, and visual impairment s/p critical illness and prolonged hospitalization. Pt hospitalized in October 2020 with ARDS 2/2 COVID19 infection. Pt sustained a complex and lengthy hospitalization which included tracheostomy, prolonged sedation, ECMO. In this period pt sustained CVA and SDH. Pt has now been liberated from tracheostomy and G-tube. Pt has since been hospitalized for wound infection and UTI. Pt lives with parents at home, has hospital bed and left chair, hoyer lift transfers, and power WC for mobility needs. Pt needs heavy physical assistance with ADL 2/2 BUE contractures and motor dysfunction   PRECAUTIONS: Fall  SUBJECTIVE: Patient reports he was sore after last visit but willing to continue with standing to try to gain some LE strength and endorses desire to transfer and walk.   Patient reports PAIN:  Are you having pain? No   TODAY'S TREATMENT:  Therapeutic Activity:   Transfer from power w/c to Mat table (in slightly reclined seated position) - Activity performed via [name] +2 assist.   Once in seated position- [name] removed  Patient performed static sitting with no back support - attempting some dynamic activities as well including 15 reps of LAQ (minimal ability to extend right LE without assist but able to extend left knee > 50% ROM.  Next positioned sit to stand lift- to end of mat- Pads donned and patient  was lifted x 3 trials today with PT on one side and patient father on opp side. Patient was positioned into standing slowly- his arms were supported on both sides. He continued to require VC/TC to keep glutes engaged and look ahead. He was able to contract on his own and able to stand for 3 periods- ranging from 59mn to approx 3 min today.  Patient was positioned back to sitting and then switched lifts back to hoyer and positioned patient back to w/c    Education provided throughout session via VC/TC and demonstration to facilitate movement at target joints and correct muscle activation for all testing and exercises performed.       PATIENT EDUCATION: Education details: posture cues with sitting Person educated: Patient Education method: Explanation, Demonstration, Tactile cues, and Verbal cues Education comprehension: verbalized understanding, returned demonstration, verbal cues required, tactile cues required, and needs further education   HOME EXERCISE PROGRAM:    PT Short Term Goals -       PT SHORT TERM GOAL #1   Title Pt will be independent with HEP in order to improve strength and balance in order to decrease fall risk and improve function at home and work.    Baseline 01/04/2021= No formal HEP in place; 12/12 no HEP in place; 05/10/2021-Patient and his father were able to report compliance with curent HEP consisting of mostly seated/reclined LE strengthening. Both verbalize no questions at this time.    Time 6    Period Weeks    Status Achieved    Target Date 02/15/21              PT Long Term Goals -      PT LONG TERM GOAL #1   Title Patient will increase BLE gross strength by 1/2 muscle grade to improve functional strength for improved independence with potential gait, increased standing tolerance and increased ADL ability.    Baseline 01/04/2021- Patient presents with 1/5 to 3-/5 B LE strength with MMT; 12/12: goal partially met for Left knee/hip; 05/10/2021= 2-/5  bilateal Hip flex; 3+/5 bilateral Knee ext; 06/21/2021= Patient presents with 2-/5 bilateral Hip flex; 3+/5 bilateral knee ext/flex; 2-/5 left ankle DF; 0/5 right ankle- and able to increase reps and resistance with LE's. 09/15/2021- Patient technically presents with 2-/5 B hip flex/abd/add - but he is able to raise his hip up to approx 100 deg which has improved. 3+/5 Bilateral knee ext, 2-/5 left ankle and 0/5 right ankle. 12/08/2021= Patient able to lift left knee at 110 deg of hip flex; presents with 3+/5 knee ext, 2-/5 left ankle DF and 0/5 right ankle DF, 2-/5 bilateral Hip abd in seated position.    Time 12    Period Weeks    Status On-going    Target Date 03/07/2022     PT LONG TERM GOAL #2   Title Patient will tolerate sitting unsupported demonstrating erect sitting posture for 15 minutes with CGA to demonstrate improved back extensor strength and improved sitting tolerance.  Baseline 01/04/2021- Patient confied to sitting in lift chair or electric power chair with back support and unable to sit upright without physical assistance; 12/12: tolerates <1 minutes upright unsupported sitting. 05/10/2021=static sit with forward trunk lean  in his power wheelchair without back support x approx 3 min. 06/21/2021=Unable to assess today due to patient with acute back pain but on previous visit able to sit x 8 min without back support. 09/15/2021- on last visit- 09/13/2021- patient was able to sit unsupported x 8 min at edge of mat. 10/13/2023 - Patient was able to sit at edge of mat with varying level of assist today from SBA to min A for a total of 20 min. 12/13/2021= Patient demonstrated unsupported sitting at edge of mat for approx 20 min   Time 12    Period Weeks    Status GOAL MET   Target Date 12/08/21      PT LONG TERM GOAL #3   Title Patient will demonstrate ability to perform static standing in // bars > 2 min with Max Assist  without loss of balance and fair posture for improved overall strength for  pre-gait and transfer activities.    Baseline 01/04/2021= Patient current uanble to stand- Dependent on hoyer or sit to stand lift for transfers. 05/10/2021=Not appropriate yet- Currently still dependent with all transfers using hoyer. 06/21/2021= Patient continuing now to focus on LE strengthening to prepare for standing-unable to try today due to acute low back pain-  planning on attempting in new cert period. 09/15/2021- Patient has attempted standing 2x in past two week- max Assist of 2 people - only once was he successful to clearing his bottom from chair - Will continue to be a focus during the new certification. 12/13/2021= Patient has been limited secondary to increased overall low back pain during this certification and will require more time to focus on this goal.    Time 12    Period Weeks    Status On-going    Target Date 03/07/2022     PT LONG TERM GOAL #4   Title Pt will improve FOTO score by 10 points or more demonstrating improved perceived functional ability    Baseline FOTO 7 on 10/17; 03/15/21: FOTO 12; 05/10/2021 06/21/2021= 1; 09/15/2021= 9; 12/13/2021= Will issue next visit    Time 12    Period Weeks    Status On-going    Target Date 03/07/2022     PT LONG TERM GOAL #5   Title Patient will perform sit to stand transfer with appropriate AD and max assist of 2 people with 75% consistency to prepare for pregait activities.    Baseline 09/15/2021= Patient unable to stand well- unable to clear his bottom off chair with Max assist of 2 persons. 12/13/2021- Goal not appropriate to try yet but will keep and roll over to next cert as shift continues to focus on transfers/standing   Time 12    Period Weeks    Status New    Target Date 03/07/2022   PT LONG TERM GOAL #6  Title Patient will tolerate sitting unsupported demonstrating erect sitting posture for 30 minutes with CGA to demonstrate improved back extensor strength and improved sitting tolerance.   Baseline 12/13/2021= Patient demonstrated  unsupported sitting at edge of mat for approx 20 min;  12/29/2021- Patient performed approx 30 min of dynamic sitting activities today.   Time 12   Period Weeks   Status ONGOING  Target Date 03/07/2022  Plan     Clinical Impression Statement Patient able to demonstrate some progress today with improved static and later dynamic sitting. He was also able to stand for an increased amount of time compared to last visit. Still difficult to position the patient with equipment but patient is able to stand and weight bear some through his legs. Patient will benefit from additional skilled PT intervention to improve strength, balance, and mobility for improved quality of life.    Personal Factors and Comorbidities Comorbidity 3+;Time since onset of injury/illness/exacerbation    Comorbidities CVA, diabetes, Seizures    Examination-Activity Limitations Bathing;Bed Mobility;Bend;Caring for Others;Carry;Dressing;Hygiene/Grooming;Lift;Locomotion Level;Reach Overhead;Self Feeding;Sit;Squat;Stairs;Stand;Transfers;Toileting    Examination-Participation Restrictions Cleaning;Community Activity;Driving;Laundry;Medication Management;Meal Prep;Occupation;Personal Finances;Shop;Yard Work;Volunteer    Stability/Clinical Decision Making Evolving/Moderate complexity    Rehab Potential Fair    PT Frequency 2x / week    PT Duration 12 weeks    PT Treatment/Interventions ADLs/Self Care Home Management;Cryotherapy;Electrical Stimulation;Moist Heat;Ultrasound;DME Instruction;Gait training;Stair training;Functional mobility training;Therapeutic exercise;Balance training;Patient/family education;Orthotic Fit/Training;Neuromuscular re-education;Wheelchair mobility training;Manual techniques;Passive range of motion;Dry needling;Energy conservation;Taping;Visual/perceptual remediation/compensation;Joint Manipulations    PT Next Visit Plan core strength- sitting at EOM, postural control, sabina sit to stand if able;  Continue with progressive LE Strengthening    PT Home Exercise Plan No changes to HEP today    Consulted and Agree with Plan of Care Patient;Family member/caregiver    Family Member Consulted Dad               Lewis Moccasin, PT 01/27/2022, 7:50 AM

## 2022-01-31 ENCOUNTER — Ambulatory Visit: Payer: BC Managed Care – PPO

## 2022-01-31 ENCOUNTER — Encounter: Payer: BC Managed Care – PPO | Admitting: Occupational Therapy

## 2022-01-31 DIAGNOSIS — R2689 Other abnormalities of gait and mobility: Secondary | ICD-10-CM | POA: Diagnosis not present

## 2022-01-31 DIAGNOSIS — R262 Difficulty in walking, not elsewhere classified: Secondary | ICD-10-CM

## 2022-01-31 DIAGNOSIS — R2681 Unsteadiness on feet: Secondary | ICD-10-CM | POA: Diagnosis not present

## 2022-01-31 DIAGNOSIS — I6930 Unspecified sequelae of cerebral infarction: Secondary | ICD-10-CM | POA: Diagnosis not present

## 2022-01-31 DIAGNOSIS — R278 Other lack of coordination: Secondary | ICD-10-CM

## 2022-01-31 DIAGNOSIS — R41841 Cognitive communication deficit: Secondary | ICD-10-CM | POA: Diagnosis not present

## 2022-01-31 DIAGNOSIS — M6281 Muscle weakness (generalized): Secondary | ICD-10-CM

## 2022-01-31 DIAGNOSIS — R269 Unspecified abnormalities of gait and mobility: Secondary | ICD-10-CM | POA: Diagnosis not present

## 2022-01-31 NOTE — Therapy (Signed)
OUTPATIENT PHYSICAL THERAPY TREATMENT NOTE Patient Name: Paul Hall MRN: [id number] DOB:03-Dec-1995, 26 y.o., male Today's Date: 02/01/2022  PCP:  Loistine Chance PROVIDER:  Collene Mares, PA-C   PT End of Session - 01/31/22 1557     Visit Number 32    Number of Visits 81    Date for PT Re-Evaluation 03/07/22    Authorization Type [employer] and [employer]- Need auth past 12th visit    Authorization Time Period 01/04/21-03/29/21; Recert 40/76/8088-04/13/3157; Recert 4/58/5929- 05/08/4626; Recert 6/38/1771-16/08/7901    Progress Note Due on Visit 52    PT Start Time 1510    PT Stop Time 1555    PT Time Calculation (min) 45 min    Equipment Utilized During Treatment Gait belt   Hoyer lift   Activity Tolerance Patient tolerated treatment well   queasiness/nausea   Behavior During Therapy WFL for tasks assessed/performed                Past Medical History:  Diagnosis Date   Diabetes mellitus ([place])    Hypertension    Stroke [facility])    Past Surgical History:  Procedure Laterality Date   IVC FILTER PLACEMENT ([place] [facility]X)     LEG SURGERY     PEG TUBE PLACEMENT     TRACHEOSTOMY     Patient Active Problem List   Diagnosis Date Noted   Sepsis due to vancomycin resistant Enterococcus species ([place]) 06/06/2019   SIRS (systemic inflammatory response syndrome) ([place]) 06/05/2019   Acute lower UTI 06/05/2019   VRE (vancomycin-resistant Enterococci) infection 06/05/2019   Anemia 06/05/2019   Skin ulcer of sacrum with necrosis of muscle ([place])    Urinary retention    Type 2 diabetes mellitus without complication, with long-term current use of insulin ([facility])    Tachycardia    Lower extremity edema    Acute metabolic encephalopathy    Obstructive sleep apnea    Morbid obesity with BMI of 60.0-69.9, adult ([place])    Goals of care, counseling/discussion    Palliative care encounter    Sepsis ([place]) 04/27/2019   H/O insulin dependent diabetes mellitus 04/27/2019   History of CVA  with residual deficit 04/27/2019   Seizure disorder ([place]) 04/27/2019   Decubitus ulcer of sacral region, stage 4 ([place]) 04/27/2019    REFERRING DIAG: Cerebral infarction, unspecified   THERAPY DIAG:  Muscle weakness (generalized)  Other lack of coordination  History of CVA with residual deficit  Difficulty in walking, not elsewhere classified  Unsteadiness on feet  Abnormality of gait and mobility  Other abnormalities of gait and mobility  Rationale for Evaluation and Treatment Rehabilitation  PERTINENT HISTORY: Paul Hall is a 25yoM who presents with severe weakness, quadriparesis, altered sensorium, and visual impairment s/p critical illness and prolonged hospitalization. Pt hospitalized in October 2020 with ARDS 2/2 COVID19 infection. Pt sustained a complex and lengthy hospitalization which included tracheostomy, prolonged sedation, ECMO. In this period pt sustained CVA and SDH. Pt has now been liberated from tracheostomy and G-tube. Pt has since been hospitalized for wound infection and UTI. Pt lives with parents at home, has hospital bed and left chair, hoyer lift transfers, and power WC for mobility needs. Pt needs heavy physical assistance with ADL 2/2 BUE contractures and motor dysfunction   PRECAUTIONS: Fall  SUBJECTIVE: Patient reports having an upset stomach today and states he would like to stand but not feeling up to trying due to upset stomach  Patient reports PAIN:  Are you having pain? No  TODAY'S TREATMENT:   Therex:    Passive hamstring stretch- hold 45 sec x 2 sets each LE Quad sets- 3 sec hold x 15 reps total each LE (VC to hold for 3 sec) Ham sets- AROM 2 sets of 10reps with 3 sec hold  LAQ- BLE - AROM- 2 sets of 15 reps.  Hip flex (AAROM) BLE 2 sets of 15 reps B hip add squeeze with manual resistance - hold 3 sec 2 sets of 10 reps B hip ABD isometric with 3 sec hold x 10 reps - 2 sets Leg press- B LE - with manual resistance for 2 sets of 10  reps each.     Education provided throughout session via VC/TC and demonstration to facilitate movement at target joints and correct muscle activation for all testing and exercises performed.       PATIENT EDUCATION: Education details: posture cues with sitting Person educated: Patient Education method: Explanation, Demonstration, Tactile cues, and Verbal cues Education comprehension: verbalized understanding, returned demonstration, verbal cues required, tactile cues required, and needs further education   HOME EXERCISE PROGRAM:    PT Short Term Goals -       PT SHORT TERM GOAL #1   Title Pt will be independent with HEP in order to improve strength and balance in order to decrease fall risk and improve function at home and work.    Baseline 01/04/2021= No formal HEP in place; 12/12 no HEP in place; 05/10/2021-Patient and his father were able to report compliance with curent HEP consisting of mostly seated/reclined LE strengthening. Both verbalize no questions at this time.    Time 6    Period Weeks    Status Achieved    Target Date 02/15/21              PT Long Term Goals -      PT LONG TERM GOAL #1   Title Patient will increase BLE gross strength by 1/2 muscle grade to improve functional strength for improved independence with potential gait, increased standing tolerance and increased ADL ability.    Baseline 01/04/2021- Patient presents with 1/5 to 3-/5 B LE strength with MMT; 12/12: goal partially met for Left knee/hip; 05/10/2021= 2-/5 bilateal Hip flex; 3+/5 bilateral Knee ext; 06/21/2021= Patient presents with 2-/5 bilateral Hip flex; 3+/5 bilateral knee ext/flex; 2-/5 left ankle DF; 0/5 right ankle- and able to increase reps and resistance with LE's. 09/15/2021- Patient technically presents with 2-/5 B hip flex/abd/add - but he is able to raise his hip up to approx 100 deg which has improved. 3+/5 Bilateral knee ext, 2-/5 left ankle and 0/5 right ankle. 12/08/2021= Patient able  to lift left knee at 110 deg of hip flex; presents with 3+/5 knee ext, 2-/5 left ankle DF and 0/5 right ankle DF, 2-/5 bilateral Hip abd in seated position.    Time 12    Period Weeks    Status On-going    Target Date 03/07/2022     PT LONG TERM GOAL #2   Title Patient will tolerate sitting unsupported demonstrating erect sitting posture for 15 minutes with CGA to demonstrate improved back extensor strength and improved sitting tolerance.    Baseline 01/04/2021- Patient confied to sitting in lift chair or electric power chair with back support and unable to sit upright without physical assistance; 12/12: tolerates <1 minutes upright unsupported sitting. 05/10/2021=static sit with forward trunk lean  in his power wheelchair without back support x approx 3 min. 06/21/2021=Unable to assess today  due to patient with acute back pain but on previous visit able to sit x 8 min without back support. 09/15/2021- on last visit- 09/13/2021- patient was able to sit unsupported x 8 min at edge of mat. 10/13/2023 - Patient was able to sit at edge of mat with varying level of assist today from SBA to min A for a total of 20 min. 12/13/2021= Patient demonstrated unsupported sitting at edge of mat for approx 20 min   Time 12    Period Weeks    Status GOAL MET   Target Date 12/08/21      PT LONG TERM GOAL #3   Title Patient will demonstrate ability to perform static standing in // bars > 2 min with Max Assist  without loss of balance and fair posture for improved overall strength for pre-gait and transfer activities.    Baseline 01/04/2021= Patient current uanble to stand- Dependent on hoyer or sit to stand lift for transfers. 05/10/2021=Not appropriate yet- Currently still dependent with all transfers using hoyer. 06/21/2021= Patient continuing now to focus on LE strengthening to prepare for standing-unable to try today due to acute low back pain-  planning on attempting in new cert period. 09/15/2021- Patient has attempted  standing 2x in past two week- max Assist of 2 people - only once was he successful to clearing his bottom from chair - Will continue to be a focus during the new certification. 12/13/2021= Patient has been limited secondary to increased overall low back pain during this certification and will require more time to focus on this goal.    Time 12    Period Weeks    Status On-going    Target Date 03/07/2022     PT LONG TERM GOAL #4   Title Pt will improve FOTO score by 10 points or more demonstrating improved perceived functional ability    Baseline FOTO 7 on 10/17; 03/15/21: FOTO 12; 05/10/2021 06/21/2021= 1; 09/15/2021= 9; 12/13/2021= Will issue next visit    Time 12    Period Weeks    Status On-going    Target Date 03/07/2022     PT LONG TERM GOAL #5   Title Patient will perform sit to stand transfer with appropriate AD and max assist of 2 people with 75% consistency to prepare for pregait activities.    Baseline 09/15/2021= Patient unable to stand well- unable to clear his bottom off chair with Max assist of 2 persons. 12/13/2021- Goal not appropriate to try yet but will keep and roll over to next cert as shift continues to focus on transfers/standing   Time 12    Period Weeks    Status New    Target Date 03/07/2022   PT LONG TERM GOAL #6  Title Patient will tolerate sitting unsupported demonstrating erect sitting posture for 30 minutes with CGA to demonstrate improved back extensor strength and improved sitting tolerance.   Baseline 12/13/2021= Patient demonstrated unsupported sitting at edge of mat for approx 20 min;  12/29/2021- Patient performed approx 30 min of dynamic sitting activities today.   Time 12   Period Weeks   Status ONGOING  Target Date 03/07/2022            Plan     Clinical Impression Statement Treatment focused on LE Strengthening within patient tolerance as He was complaining of upset stomach during session. He was able to take some manual resistance and performed well  with mostly 2 sets - fatigue as limiting factor. Patient will benefit  from additional skilled PT intervention to improve strength, balance, and mobility for improved quality of life.    Personal Factors and Comorbidities Comorbidity 3+;Time since onset of injury/illness/exacerbation    Comorbidities CVA, diabetes, Seizures    Examination-Activity Limitations Bathing;Bed Mobility;Bend;Caring for Others;Carry;Dressing;Hygiene/Grooming;Lift;Locomotion Level;Reach Overhead;Self Feeding;Sit;Squat;Stairs;Stand;Transfers;Toileting    Examination-Participation Restrictions Cleaning;Community Activity;Driving;Laundry;Medication Management;Meal Prep;Occupation;Personal Finances;Shop;Yard Work;Volunteer    Stability/Clinical Decision Making Evolving/Moderate complexity    Rehab Potential Fair    PT Frequency 2x / week    PT Duration 12 weeks    PT Treatment/Interventions ADLs/Self Care Home Management;Cryotherapy;Electrical Stimulation;Moist Heat;Ultrasound;DME Instruction;Gait training;Stair training;Functional mobility training;Therapeutic exercise;Balance training;Patient/family education;Orthotic Fit/Training;Neuromuscular re-education;Wheelchair mobility training;Manual techniques;Passive range of motion;Dry needling;Energy conservation;Taping;Visual/perceptual remediation/compensation;Joint Manipulations    PT Next Visit Plan core strength- sitting at EOM, postural control, sabina sit to stand if able; Continue with progressive LE Strengthening    PT Home Exercise Plan No changes to HEP today    Consulted and Agree with Plan of Care Patient;Family member/caregiver    Family Member Consulted Dad               Lewis Moccasin, PT 02/01/2022, 1:12 PM

## 2022-02-02 ENCOUNTER — Ambulatory Visit: Payer: BC Managed Care – PPO | Attending: Physician Assistant

## 2022-02-02 ENCOUNTER — Encounter: Payer: Self-pay | Admitting: Occupational Therapy

## 2022-02-02 ENCOUNTER — Ambulatory Visit: Payer: BC Managed Care – PPO

## 2022-02-02 ENCOUNTER — Ambulatory Visit: Payer: BC Managed Care – PPO | Admitting: Occupational Therapy

## 2022-02-02 DIAGNOSIS — R2681 Unsteadiness on feet: Secondary | ICD-10-CM | POA: Diagnosis not present

## 2022-02-02 DIAGNOSIS — I6930 Unspecified sequelae of cerebral infarction: Secondary | ICD-10-CM | POA: Diagnosis not present

## 2022-02-02 DIAGNOSIS — H543 Unqualified visual loss, both eyes: Secondary | ICD-10-CM | POA: Diagnosis not present

## 2022-02-02 DIAGNOSIS — M6281 Muscle weakness (generalized): Secondary | ICD-10-CM | POA: Diagnosis not present

## 2022-02-02 DIAGNOSIS — R262 Difficulty in walking, not elsewhere classified: Secondary | ICD-10-CM | POA: Insufficient documentation

## 2022-02-02 DIAGNOSIS — R278 Other lack of coordination: Secondary | ICD-10-CM | POA: Diagnosis not present

## 2022-02-02 DIAGNOSIS — R2689 Other abnormalities of gait and mobility: Secondary | ICD-10-CM | POA: Insufficient documentation

## 2022-02-02 DIAGNOSIS — R269 Unspecified abnormalities of gait and mobility: Secondary | ICD-10-CM | POA: Diagnosis not present

## 2022-02-02 NOTE — Therapy (Signed)
OUTPATIENT PHYSICAL THERAPY TREATMENT NOTE Patient Name: Paul Hall MRN: [id number] DOB:03-Feb-1996, 26 y.o., male Today's Date: 02/03/2022  PCP:  Loistine Chance PROVIDER:  Collene Mares, PA-C   PT End of Session - 02/02/22 1658     Visit Number 34    Number of Visits 10    Date for PT Re-Evaluation 03/07/22    Authorization Type [employer] and [employer] Medicaid- Need auth past 12th visit    Authorization Time Period 01/04/21-03/29/21; Recert 54/62/7035-0/12/3816; Recert 2/99/3716- 12/09/7891; Recert 11/11/1749-05/09/8525    Progress Note Due on Visit 81    PT Start Time 1517    PT Stop Time 1559    PT Time Calculation (min) 42 min    Equipment Utilized During Treatment Gait belt   Hoyer lift   Activity Tolerance Patient tolerated treatment well   queasiness/nausea   Behavior During Therapy WFL for tasks assessed/performed                 Past Medical History:  Diagnosis Date   Diabetes mellitus ([place])    Hypertension    Stroke [facility])    Past Surgical History:  Procedure Laterality Date   IVC FILTER PLACEMENT ([place] [facility])     LEG SURGERY     PEG TUBE PLACEMENT     TRACHEOSTOMY     Patient Active Problem List   Diagnosis Date Noted   Sepsis due to vancomycin resistant Enterococcus species ([place]) 06/06/2019   SIRS (systemic inflammatory response syndrome) ([place]) 06/05/2019   Acute lower UTI 06/05/2019   VRE (vancomycin-resistant Enterococci) infection 06/05/2019   Anemia 06/05/2019   Skin ulcer of sacrum with necrosis of muscle ([place])    Urinary retention    Type 2 diabetes mellitus without complication, with long-term current use of insulin (H[facility])    Tachycardia    Lower extremity edema    Acute metabolic encephalopathy    Obstructive sleep apnea    Morbid obesity with BMI of 60.0-69.9, adult ([place])    Goals of care, counseling/discussion    Palliative care encounter    Sepsis ([place]) 04/27/2019   H/O insulin dependent diabetes mellitus 04/27/2019   History of CVA  with residual deficit 04/27/2019   Seizure disorder ([place]) 04/27/2019   Decubitus ulcer of sacral region, stage 4 ([place]) 04/27/2019    REFERRING DIAG: Cerebral infarction, unspecified   THERAPY DIAG:  Muscle weakness (generalized)  Other lack of coordination  History of CVA with residual deficit  Difficulty in walking, not elsewhere classified  Unsteadiness on feet  Abnormality of gait and mobility  Other abnormalities of gait and mobility  Rationale for Evaluation and Treatment Rehabilitation  PERTINENT HISTORY: Paul Hall is a 26yoM who presents with severe weakness, quadriparesis, altered sensorium, and visual impairment s/p critical illness and prolonged hospitalization. Pt hospitalized in October 2020 with ARDS 2/2 COVID19 infection. Pt sustained a complex and lengthy hospitalization which included tracheostomy, prolonged sedation, ECMO. In this period pt sustained CVA and SDH. Pt has now been liberated from tracheostomy and G-tube. Pt has since been hospitalized for wound infection and UTI. Pt lives with parents at home, has hospital bed and left chair, hoyer lift transfers, and power WC for mobility needs. Pt needs heavy physical assistance with ADL 2/2 BUE contractures and motor dysfunction   PRECAUTIONS: Fall  SUBJECTIVE: Patient reports having an upset stomach today and states he would like to stand but not feeling up to trying due to upset stomach  Patient reports PAIN:  Are you having pain? No  TODAY'S TREATMENT:   Therapeutic Activity:    Transfer- Hoyer from w/c to mat table (seated position) +2 assist.  Patient performed static sitting without back support for approx  3 min while negotiating exchanging hoyer for sit to stand lift.   Patient was lifted x 4 trials today with PT on one side and patient father on opp side. Patient was positioned into standing slowly- his arms were supported on both sides. He continued to require VC/TC to keep glutes engaged and  look ahead. He was more stable on 1st stand- able to breathe without excessive  anxiety. He was able to stand for 1 min 30 sec; 1 min; 35 sec and 30 sec respectively   Education provided throughout session via VC/TC and demonstration to facilitate movement at target joints and correct muscle activation for all testing and exercises performed.       PATIENT EDUCATION: Education details: posture cues with sitting Person educated: Patient Education method: Explanation, Demonstration, Tactile cues, and Verbal cues Education comprehension: verbalized understanding, returned demonstration, verbal cues required, tactile cues required, and needs further education   HOME EXERCISE PROGRAM:    PT Short Term Goals -       PT SHORT TERM GOAL #1   Title Pt will be independent with HEP in order to improve strength and balance in order to decrease fall risk and improve function at home and work.    Baseline 01/04/2021= No formal HEP in place; 12/12 no HEP in place; 05/10/2021-Patient and his father were able to report compliance with curent HEP consisting of mostly seated/reclined LE strengthening. Both verbalize no questions at this time.    Time 6    Period Weeks    Status Achieved    Target Date 02/15/21              PT Long Term Goals -      PT LONG TERM GOAL #1   Title Patient will increase BLE gross strength by 1/2 muscle grade to improve functional strength for improved independence with potential gait, increased standing tolerance and increased ADL ability.    Baseline 01/04/2021- Patient presents with 1/5 to 3-/5 B LE strength with MMT; 12/12: goal partially met for Left knee/hip; 05/10/2021= 2-/5 bilateal Hip flex; 3+/5 bilateral Knee ext; 06/21/2021= Patient presents with 2-/5 bilateral Hip flex; 3+/5 bilateral knee ext/flex; 2-/5 left ankle DF; 0/5 right ankle- and able to increase reps and resistance with LE's. 09/15/2021- Patient technically presents with 2-/5 B hip flex/abd/add - but he  is able to raise his hip up to approx 100 deg which has improved. 3+/5 Bilateral knee ext, 2-/5 left ankle and 0/5 right ankle. 12/08/2021= Patient able to lift left knee at 110 deg of hip flex; presents with 3+/5 knee ext, 2-/5 left ankle DF and 0/5 right ankle DF, 2-/5 bilateral Hip abd in seated position.    Time 12    Period Weeks    Status On-going    Target Date 03/07/2022     PT LONG TERM GOAL #2   Title Patient will tolerate sitting unsupported demonstrating erect sitting posture for 15 minutes with CGA to demonstrate improved back extensor strength and improved sitting tolerance.    Baseline 01/04/2021- Patient confied to sitting in lift chair or electric power chair with back support and unable to sit upright without physical assistance; 12/12: tolerates <1 minutes upright unsupported sitting. 05/10/2021=static sit with forward trunk lean  in his power wheelchair without back support x approx 3  min. 06/21/2021=Unable to assess today due to patient with acute back pain but on previous visit able to sit x 8 min without back support. 09/15/2021- on last visit- 09/13/2021- patient was able to sit unsupported x 8 min at edge of mat. 10/13/2023 - Patient was able to sit at edge of mat with varying level of assist today from SBA to min A for a total of 20 min. 12/13/2021= Patient demonstrated unsupported sitting at edge of mat for approx 20 min   Time 12    Period Weeks    Status GOAL MET   Target Date 12/08/21      PT LONG TERM GOAL #3   Title Patient will demonstrate ability to perform static standing in // bars > 2 min with Max Assist  without loss of balance and fair posture for improved overall strength for pre-gait and transfer activities.    Baseline 01/04/2021= Patient current uanble to stand- Dependent on hoyer or sit to stand lift for transfers. 05/10/2021=Not appropriate yet- Currently still dependent with all transfers using hoyer. 06/21/2021= Patient continuing now to focus on LE strengthening to  prepare for standing-unable to try today due to acute low back pain-  planning on attempting in new cert period. 09/15/2021- Patient has attempted standing 2x in past two week- max Assist of 2 people - only once was he successful to clearing his bottom from chair - Will continue to be a focus during the new certification. 12/13/2021= Patient has been limited secondary to increased overall low back pain during this certification and will require more time to focus on this goal.    Time 12    Period Weeks    Status On-going    Target Date 03/07/2022     PT LONG TERM GOAL #4   Title Pt will improve FOTO score by 10 points or more demonstrating improved perceived functional ability    Baseline FOTO 7 on 10/17; 03/15/21: FOTO 12; 05/10/2021 06/21/2021= 1; 09/15/2021= 9; 12/13/2021= Will issue next visit    Time 12    Period Weeks    Status On-going    Target Date 03/07/2022     PT LONG TERM GOAL #5   Title Patient will perform sit to stand transfer with appropriate AD and max assist of 2 people with 75% consistency to prepare for pregait activities.    Baseline 09/15/2021= Patient unable to stand well- unable to clear his bottom off chair with Max assist of 2 persons. 12/13/2021- Goal not appropriate to try yet but will keep and roll over to next cert as shift continues to focus on transfers/standing   Time 12    Period Weeks    Status New    Target Date 03/07/2022   PT LONG TERM GOAL #6  Title Patient will tolerate sitting unsupported demonstrating erect sitting posture for 30 minutes with CGA to demonstrate improved back extensor strength and improved sitting tolerance.   Baseline 12/13/2021= Patient demonstrated unsupported sitting at edge of mat for approx 20 min;  12/29/2021- Patient performed approx 30 min of dynamic sitting activities today.   Time 12   Period Weeks   Status ONGOING  Target Date 03/07/2022            Plan     Clinical Impression Statement Treatment focused on LE  strengthening/standing ability/endurance with assist of sit to stand lift. He still lacks efficient strength in LE (glutes and quads to stand well) but did exhibit increased time and much improved overall  breathing with standing today. Patient will benefit from additional skilled PT intervention to improve strength, balance, and mobility for improved quality of life.    Personal Factors and Comorbidities Comorbidity 3+;Time since onset of injury/illness/exacerbation    Comorbidities CVA, diabetes, Seizures    Examination-Activity Limitations Bathing;Bed Mobility;Bend;Caring for Others;Carry;Dressing;Hygiene/Grooming;Lift;Locomotion Level;Reach Overhead;Self Feeding;Sit;Squat;Stairs;Stand;Transfers;Toileting    Examination-Participation Restrictions Cleaning;Community Activity;Driving;Laundry;Medication Management;Meal Prep;Occupation;Personal Finances;Shop;Yard Work;Volunteer    Stability/Clinical Decision Making Evolving/Moderate complexity    Rehab Potential Fair    PT Frequency 2x / week    PT Duration 12 weeks    PT Treatment/Interventions ADLs/Self Care Home Management;Cryotherapy;Electrical Stimulation;Moist Heat;Ultrasound;DME Instruction;Gait training;Stair training;Functional mobility training;Therapeutic exercise;Balance training;Patient/family education;Orthotic Fit/Training;Neuromuscular re-education;Wheelchair mobility training;Manual techniques;Passive range of motion;Dry needling;Energy conservation;Taping;Visual/perceptual remediation/compensation;Joint Manipulations    PT Next Visit Plan core strength- sitting at EOM, postural control, sabina sit to stand if able; Continue with progressive LE Strengthening    PT Home Exercise Plan No changes to HEP today    Consulted and Agree with Plan of Care Patient;Family member/caregiver    Family Member Consulted Dad               Lewis Moccasin, PT 02/03/2022, 1:39 PM

## 2022-02-02 NOTE — Therapy (Signed)
OUTPATIENT OCCUPATIONAL THERAPY TREATMENT  Patient Name: Paul Hall MRN: [id number] DOB:[birth year], 26 y.o., male Today's Date: 02/02/2022  PCP: Dr. Gerarda Fraction REFERRING PROVIDER: Dr. Gerarda Fraction   OT End of Session - 02/02/22 1552     Visit Number 3    Number of Visits 12    Date for OT Re-Evaluation 04/11/22    Authorization Type Progress report period starting 10/06/2021    OT Start Time 1435    OT Stop Time 1515    OT Time Calculation (min) 40 min    Activity Tolerance Patient tolerated treatment well    Behavior During Therapy WFL for tasks assessed/performed               Past Medical History:  Diagnosis Date   Diabetes mellitus ([place])    Hypertension    Stroke [facility])    Past Surgical History:  Procedure Laterality Date   IVC FILTER PLACEMENT ([place] HX)     LEG SURGERY     PEG TUBE PLACEMENT     TRACHEOSTOMY     Patient Active Problem List   Diagnosis Date Noted   Sepsis due to vancomycin resistant Enterococcus species ([place]) 06/06/2019   SIRS (systemic inflammatory response syndrome) ([place]) 06/05/2019   Acute lower UTI 06/05/2019   VRE (vancomycin-resistant Enterococci) infection 06/05/2019   Anemia 06/05/2019   Skin ulcer of sacrum with necrosis of muscle ([place])    Urinary retention    Type 2 diabetes mellitus without complication, with long-term current use of insulin ([place])    Tachycardia    Lower extremity edema    Acute metabolic encephalopathy    Obstructive sleep apnea    Morbid obesity with BMI of 60.0-69.9, adult ([place])    Goals of care, counseling/discussion    Palliative care encounter    Sepsis ([place]) 04/27/2019   H/O insulin dependent diabetes mellitus 04/27/2019   History of CVA with residual deficit 04/27/2019   Seizure disorder ([place]) 04/27/2019   Decubitus ulcer of sacral region, stage 4 ([place]) 04/27/2019    ONSET DATE: 01/2019  REFERRING DIAG: CVA/COVID-19  THERAPY DIAG:  Muscle weakness (generalized)  Other lack of  coordination  Rationale for Evaluation and Treatment Rehabilitation  SUBJECTIVE:   Patient reports that he heard a pop in his wrist during range of motion at home.  Patient reports that it did not hurt and actually felt good to him.  SUBJECTIVE STATEMENT: Pt. Was accompanied by his father Pt accompanied by: self and family member  PERTINENT HISTORY:  Pt. Is a 26 y.o. male who was diagnosed with COVID-19, and CVA with resultant quadriplegia. Pt. Was then hospitalized with VRE UTI. PMHx includes: urinary retention, seizure disorder, obstructive sleep disorder, DM Type II, Morbid obesity.   PRECAUTIONS: None  WEIGHT BEARING RESTRICTIONS No  PAIN:  Are you having pain? No  FALLS: Has patient fallen in last 6 months? No  LIVING ENVIRONMENT: Lives with: lives with their family Lives in: House/apartment Stairs: No Level Entry Has following equipment at home: Wheelchair (power) and hoyer lift, sit to stand lift  PLOF: Independent  PATIENT GOALS: To be able to engage in more daily care tasks.  OBJECTIVE:   HAND DOMINANCE: Right  ADLs:  Transfers/ambulation related to ADLs: Eating: Pt. Reports being able to hold standard utensils, and is starting to engage more in self-feeding tasks, hand to mouth patterns. Pt. Reports that he does as much as he can with the task, and family assists  with the remainder of the task.  Grooming: Pt. Is able to initiate holding an electric toothbrush, and brush his teeth. Family assists LB Dressing: Total Assist UE dressing: Pt. Is now able to reach up to actively assist with grasping , and pulling his gown down. Toileting:  Total Assist Bathing: MaxA UB, Total assist LB Tub Shower transfers: N/A Equipment: See above    IADLs: Shopping: Relies on family to assist Light housekeeping: Total Assist Meal Prep: Total Assist Community mobility:   Medication management:  Total Assist  Financial management: N/A Handwriting: Not legible: Pt. Is able  to hold a pen with the left hand, and initiate marking the page. Pt.'s eye glasses were not available  MOBILITY STATUS:  Power w/c  POSTURE COMMENTS:  Pt. Requires position changes in his power w/c  ACTIVITY TOLERANCE: Activity tolerance:  Fair  FUNCTIONAL OUTCOME MEASURES: FOTO: 40  TR score: 45  UPPER EXTREMITY ROM     Active ROM Right eval Left eval  Shoulder flexion 106 scaption 119  Shoulder abduction 114 110  Shoulder adduction    Shoulder extension    Shoulder internal rotation    Shoulder external rotation    Elbow flexion 120(130) 135(135)  Elbow extension -45(-35) -27(-18)  Wrist flexion    Wrist extension -30(10) 10(50  Wrist ulnar deviation    Wrist radial deviation    Wrist pronation    Wrist supination    (Blank rows = not tested)   UPPER EXTREMITY MMT:     Right eval Left eval  Shoulder flexion 3-/5 scaption 3-/5  Shoulder abduction 3-/5 3-/5  Shoulder adduction    Shoulder extension    Shoulder internal rotation    Shoulder external rotation    Elbow flexion 3/5 3/5  Elbow extension 2-/5 2-/5  Wrist flexion    Wrist extension 2-/5 2/5  Wrist ulnar deviation    Wrist radial deviation    Wrist pronation    Wrist supination    (Blank rows = not tested)    HAND FUNCTION:  Grip strength: Right: 0 lbs; Left: 0 lbs and Lateral pinch: Right: 5 lbs, Left: 2 lbs  Bilateral digit PIP/DIP flexion contractures with MP hyperextension with attempts for AROM. Pt. is able to tolerate AROM to the bilateral digits at the initial evaluation however, has a history of pain in the digits.  COORDINATION:  Pt. is unable to grasp 9-hole test pegs. Pt. is able to initiate grasping larger pegs, and is able to hold a pen in the left hand.  SENSATION: Light touch: Impaired   EDEMA:  N/A  MUSCLE TONE: BUE flexor Spasticity  COGNITIO Overall cognitive status: Continue to assess in functional context  VISION:   Subjective report: Pt. Was not wearing  glasses at the time of the initial eval.  Baseline vision: Vision is very limited. Wears glasses all the time Visual history: History of impaired vision following CVA. Pt. Has received treatment through the [facility] low vision rehabilitation program.   VISION ASSESSMENT: Impaired To be further assessed in functional context  PERCEPTION: Impaired   PRAXIS: Impaired: motor planning  OBSERVATIONS:  Pt reports being on  Tramadol   TODAY'S TREATMENT:   Neuromuscular reeducation:  Patient worked on left hand grasp patterns. Patient worked on grasping 1 inch circular pegs from pegboard placed at an incline on the table.  Patient worked on formulating a lateral grasp pattern picking up the 1 inch pegs and reaching out into horizontal abduction to place the pegs on the table.  Patient worked with both  the right and the left extremities. Patient worked on Manufacturing engineer style discs with the left hand, and stacking them while stabilizing them with the right hand at midline. Pt was able to stack 5 discs.  Patient worked on removing the [employer] from [employer] with the left hand, reaching out into scationscaption to place the pegs on the tabletop surface.   Pt. Reports that he has been engaging his hands more at home while brushing his teeth. Patient reports that he completes has much as he can, and that his father assists with supporting the upper extremity when patient fatigues, and assists in completing the remainder of the task.  Patient was able to grasp the 1 inch circular pegs with the right hand however had difficulty releasing them to the right.  At times patient presented difficulty determining whether or not the peg is still in his right hand. Pt. Was able to more consistently activley release the pegs from the left hand. Patient was able to stabilize the [place] discs at midline with his right hand while stacking them with his left hand.  Patient was able to stack 5 desks, and unstack each  disc from the unstable surface.  Patient requires bilateral proximal support when performing horizontal abduction in preparation for releasing the pegs, and discs. Pt. Requires the pegs, and discs to be positioned on a high contrast surface to assist with locating the objects. Pt. Continues to work on improving bilateral upper extremity functioning in midline in preparation for moving engagement in ADL, and IADL tasks.     Marland Kitchen  PATIENT EDUCATION: Education details: OT services, POC, Goals Person educated: Patient and Parent Education method: Explanation, Demonstration, Tactile cues, and Verbal cues Education comprehension: verbalized understanding, returned demonstration, verbal cues required, and needs further education   HOME EXERCISE PROGRAM:   Continue ongoing assessment, and continue to provide as needed.     GOALS: Goals reviewed with patient? Yes  SHORT TERM GOALS: Target date: 02/14/2022    To assess splint fit, and make appropriate adjustments to promote good skin integrity through the palmar surface of the bilateral hands.  Baseline: Bilateral resting hand splints. Goal status: INITIAL   LONG TERM GOALS: Target date: 03/14/2022     FOTO score will Improve by 2 points for Pt. perceived improvement with the assessment specific ADL/IADL tasks.  Baseline: Eval: FOTO score: 40,  TR score: 45 Goal status: INITIAL  2.   Pt. will independently perform oral care for 100% of the task after complete set-up. Baseline: Eval: Pt. is able to initiate using an electric toothbrush. Pt. requires assist for set-up, and assist for thoroughness, and as  thePt. fatigues. Goal status: INITIAL  3.  Pt. Will be independent with self-feeding for 100% of the meal after complete set-up Baseline: Eval: Pt. is able to hold standard standard utensils. Pt. Performs as much of the task as he, can and has assistance for the remainder. Goal status: INITIAL  4.  Pt. Will improve grasp patterns and  consistently grasp 1/2" objects for ADL, and IADL tasks.  Baseline: Eval: Pt. is able to grasp 1" objects intermittently using a lateral grasp pattern. Goal status: INITIAL  5.  Pt. Will legibly write one, out of 3 letters of his name legibly. Baseline: Eval: Pt is able to hold a thin marker with his left hand, and formulate a a line, and initiate a circular pattern (Pt. without glasses today) Goal status: INITIAL  6. Pt. Will reach up to comb/brush  his hair  with minA.     Baseline: Pt. is able to initiate reaching up for hair care with a long handled brush, however is unable to sustain UE's in elevation to perform the task.     Goal status: INITIAL      ASSESSMENT:  CLINICAL IMPRESSION:  Pt. Reports that he has been engaging his hands more at home while brushing his teeth. Patient reports that he completes has much as he can, and that his father assists with supporting the upper extremity when patient fatigues, and assists in completing the remainder of the task.  Patient was able to grasp the 1 inch circular pegs with the right hand however had difficulty releasing them to the right.  At times patient presented difficulty determining whether or not the peg is still in his right hand. Pt. Was able to more consistently activley release the pegs from the left hand. Patient was able to stabilize the [place] discs at midline with his right hand while stacking them with his left hand.  Patient was able to stack 5 desks, and unstack each disc from the unstable surface.  Patient requires bilateral proximal support when performing horizontal abduction in preparation for releasing the pegs, and discs. Pt. Requires the pegs, and discs to be positioned on a high contrast surface to assist with locating the objects. Pt. Continues to work on improving bilateral upper extremity functioning in midline in preparation for moving engagement in ADL, and IADL tasks.   PERFORMANCE DEFICITS in functional skills  including ADLs, IADLs, coordination, dexterity, proprioception, ROM, strength, pain, [place], [facility], decreased knowledge of use of DME, and UE functional use, cognitive skills including safety awareness, and psychosocial skills including coping strategies, environmental adaptation, habits, and routines and behaviors.   IMPAIRMENTS are limiting patient from ADLs, IADLs, leisure, and social participation.   COMORBIDITIES may have co-morbidities  that affects occupational performance. Patient will benefit from skilled OT to address above impairments and improve overall function.  MODIFICATION OR ASSISTANCE TO COMPLETE EVALUATION: Maximum or significant modification of tasks or assist is necessary to complete an evaluation.  OT OCCUPATIONAL PROFILE AND HISTORY: Comprehensive assessment: Review of records and extensive additional review of physical, cognitive, psychosocial history related to current functional performance.  CLINICAL DECISION MAKING: High - multiple treatment options, significant modification of task necessary  REHAB POTENTIAL: Fair    EVALUATION COMPLEXITY: High    PLAN: OT FREQUENCY: 1x/week  OT DURATION:8 weeks  PLANNED INTERVENTIONS: self care/ADL training, therapeutic exercise, therapeutic activity, neuromuscular re-education, manual therapy, passive range of motion, patient/family education, and cognitive remediation/compensation  RECOMMENDED OTHER SERVICES: PT  CONSULTED AND AGREED WITH PLAN OF CARE: Patient and family member/caregiver  PLAN FOR NEXT SESSION: Initiate treatment   Harrel Carina, MS, OTR/L   Harrel Carina, OT 02/02/2022, 3:54 PM

## 2022-02-03 ENCOUNTER — Telehealth: Payer: Self-pay

## 2022-02-03 NOTE — Telephone Encounter (Signed)
1212 pm.  Request received from [facility], NP regarding possible need for blood work.  Phone call made to mother and she advises blood work will be obtained at PCP office.  No other needs voiced at this time.

## 2022-02-07 ENCOUNTER — Ambulatory Visit: Payer: BC Managed Care – PPO | Admitting: Occupational Therapy

## 2022-02-07 ENCOUNTER — Ambulatory Visit: Payer: BC Managed Care – PPO

## 2022-02-07 DIAGNOSIS — R262 Difficulty in walking, not elsewhere classified: Secondary | ICD-10-CM

## 2022-02-07 DIAGNOSIS — R2689 Other abnormalities of gait and mobility: Secondary | ICD-10-CM

## 2022-02-07 DIAGNOSIS — I6930 Unspecified sequelae of cerebral infarction: Secondary | ICD-10-CM | POA: Diagnosis not present

## 2022-02-07 DIAGNOSIS — R269 Unspecified abnormalities of gait and mobility: Secondary | ICD-10-CM | POA: Diagnosis not present

## 2022-02-07 DIAGNOSIS — R278 Other lack of coordination: Secondary | ICD-10-CM | POA: Diagnosis not present

## 2022-02-07 DIAGNOSIS — H543 Unqualified visual loss, both eyes: Secondary | ICD-10-CM | POA: Diagnosis not present

## 2022-02-07 DIAGNOSIS — M6281 Muscle weakness (generalized): Secondary | ICD-10-CM

## 2022-02-07 DIAGNOSIS — R2681 Unsteadiness on feet: Secondary | ICD-10-CM | POA: Diagnosis not present

## 2022-02-07 NOTE — Therapy (Signed)
OUTPATIENT PHYSICAL THERAPY TREATMENT NOTE Patient Name: Paul Hall MRN: [id number] DOB:1995-06-15, 26 y.o., male Today's Date: 02/07/2022  PCP:  Loistine Chance PROVIDER:  Collene Mares, PA-C   PT End of Session - 02/07/22 1532     Visit Number 70    Number of Visits 81    Date for PT Re-Evaluation 03/07/22    Authorization Type [employer] and [employer]- Need auth past 12th visit    Authorization Time Period 01/04/21-03/29/21; Recert 46/80/3212-2/48/2500; Recert 3/70/4888- 12/04/6943; Recert 0/38/8828-00/06/4915    Progress Note Due on Visit 80    PT Start Time 1600    PT Stop Time 1636    PT Time Calculation (min) 36 min    Equipment Utilized During Treatment Gait belt   Hoyer lift   Activity Tolerance Patient tolerated treatment well   queasiness/nausea   Behavior During Therapy WFL for tasks assessed/performed                 Past Medical History:  Diagnosis Date   Diabetes mellitus ([place])    Hypertension    Stroke [facility])    Past Surgical History:  Procedure Laterality Date   IVC FILTER PLACEMENT ([place] HX)     LEG SURGERY     PEG TUBE PLACEMENT     TRACHEOSTOMY     Patient Active Problem List   Diagnosis Date Noted   Sepsis due to vancomycin resistant Enterococcus species ([place]) 06/06/2019   SIRS (systemic inflammatory response syndrome) ([place]) 06/05/2019   Acute lower UTI 06/05/2019   VRE (vancomycin-resistant Enterococci) infection 06/05/2019   Anemia 06/05/2019   Skin ulcer of sacrum with necrosis of muscle ([place])    Urinary retention    Type 2 diabetes mellitus without complication, with long-term current use of insulin (H[facility])    Tachycardia    Lower extremity edema    Acute metabolic encephalopathy    Obstructive sleep apnea    Morbid obesity with BMI of 60.0-69.9, adult ([place])    Goals of care, counseling/discussion    Palliative care encounter    Sepsis ([place]) 04/27/2019   H/O insulin dependent diabetes mellitus 04/27/2019   History of CVA  with residual deficit 04/27/2019   Seizure disorder ([place]) 04/27/2019   Decubitus ulcer of sacral region, stage 4 ([place]) 04/27/2019    REFERRING DIAG: Cerebral infarction, unspecified   THERAPY DIAG:  Muscle weakness (generalized)  Other lack of coordination  History of CVA with residual deficit  Difficulty in walking, not elsewhere classified  Unsteadiness on feet  Abnormality of gait and mobility  Other abnormalities of gait and mobility  Rationale for Evaluation and Treatment Rehabilitation  PERTINENT HISTORY: Paul Hall is a 25yoM who presents with severe weakness, quadriparesis, altered sensorium, and visual impairment s/p critical illness and prolonged hospitalization. Pt hospitalized in October 2020 with ARDS 2/2 COVID19 infection. Pt sustained a complex and lengthy hospitalization which included tracheostomy, prolonged sedation, ECMO. In this period pt sustained CVA and SDH. Pt has now been liberated from tracheostomy and G-tube. Pt has since been hospitalized for wound infection and UTI. Pt lives with parents at home, has hospital bed and left chair, hoyer lift transfers, and power WC for mobility needs. Pt needs heavy physical assistance with ADL 2/2 BUE contractures and motor dysfunction   PRECAUTIONS: Fall  SUBJECTIVE: Patient reports being very tired after OT session today. Agreeable to seated therex but stated too fatigued to try to stand.   Patient reports PAIN:  Are you having pain? No  TODAY'S TREATMENT:   Therapeutic Exercise:    Therapeutic Exercises:  Seated Knee flex with YTB 2 sets of 10 reps each LE.  Seated hip march with 1.5 # AW 2 x 12 reps each LE (AAROM- active as much as possible then some physical assist)   Seated Knee ext with 1.5# AW - 2 x15 reps each LE (AROM)   Seated hip abd  with YTB resistance  2 x 12 reps each LE Seated Hip add  with YTB 2 sets x15 reps each LE.   Education provided throughout session via VC/TC and demonstration  to facilitate movement at target joints and correct muscle activation for all testing and exercises performed.       PATIENT EDUCATION: Education details: posture cues with sitting Person educated: Patient Education method: Explanation, Demonstration, Tactile cues, and Verbal cues Education comprehension: verbalized understanding, returned demonstration, verbal cues required, tactile cues required, and needs further education   HOME EXERCISE PROGRAM:    PT Short Term Goals -       PT SHORT TERM GOAL #1   Title Pt will be independent with HEP in order to improve strength and balance in order to decrease fall risk and improve function at home and work.    Baseline 01/04/2021= No formal HEP in place; 12/12 no HEP in place; 05/10/2021-Patient and his father were able to report compliance with curent HEP consisting of mostly seated/reclined LE strengthening. Both verbalize no questions at this time.    Time 6    Period Weeks    Status Achieved    Target Date 02/15/21              PT Long Term Goals -      PT LONG TERM GOAL #1   Title Patient will increase BLE gross strength by 1/2 muscle grade to improve functional strength for improved independence with potential gait, increased standing tolerance and increased ADL ability.    Baseline 01/04/2021- Patient presents with 1/5 to 3-/5 B LE strength with MMT; 12/12: goal partially met for Left knee/hip; 05/10/2021= 2-/5 bilateal Hip flex; 3+/5 bilateral Knee ext; 06/21/2021= Patient presents with 2-/5 bilateral Hip flex; 3+/5 bilateral knee ext/flex; 2-/5 left ankle DF; 0/5 right ankle- and able to increase reps and resistance with LE's. 09/15/2021- Patient technically presents with 2-/5 B hip flex/abd/add - but he is able to raise his hip up to approx 100 deg which has improved. 3+/5 Bilateral knee ext, 2-/5 left ankle and 0/5 right ankle. 12/08/2021= Patient able to lift left knee at 110 deg of hip flex; presents with 3+/5 knee ext, 2-/5 left  ankle DF and 0/5 right ankle DF, 2-/5 bilateral Hip abd in seated position.    Time 12    Period Weeks    Status On-going    Target Date 03/07/2022     PT LONG TERM GOAL #2   Title Patient will tolerate sitting unsupported demonstrating erect sitting posture for 15 minutes with CGA to demonstrate improved back extensor strength and improved sitting tolerance.    Baseline 01/04/2021- Patient confied to sitting in lift chair or electric power chair with back support and unable to sit upright without physical assistance; 12/12: tolerates <1 minutes upright unsupported sitting. 05/10/2021=static sit with forward trunk lean  in his power wheelchair without back support x approx 3 min. 06/21/2021=Unable to assess today due to patient with acute back pain but on previous visit able to sit x 8 min without back support. 09/15/2021- on last  visit- 09/13/2021- patient was able to sit unsupported x 8 min at edge of mat. 10/13/2023 - Patient was able to sit at edge of mat with varying level of assist today from SBA to min A for a total of 20 min. 12/13/2021= Patient demonstrated unsupported sitting at edge of mat for approx 20 min   Time 12    Period Weeks    Status GOAL MET   Target Date 12/08/21      PT LONG TERM GOAL #3   Title Patient will demonstrate ability to perform static standing in // bars > 2 min with Max Assist  without loss of balance and fair posture for improved overall strength for pre-gait and transfer activities.    Baseline 01/04/2021= Patient current uanble to stand- Dependent on hoyer or sit to stand lift for transfers. 05/10/2021=Not appropriate yet- Currently still dependent with all transfers using hoyer. 06/21/2021= Patient continuing now to focus on LE strengthening to prepare for standing-unable to try today due to acute low back pain-  planning on attempting in new cert period. 09/15/2021- Patient has attempted standing 2x in past two week- max Assist of 2 people - only once was he successful to  clearing his bottom from chair - Will continue to be a focus during the new certification. 12/13/2021= Patient has been limited secondary to increased overall low back pain during this certification and will require more time to focus on this goal.    Time 12    Period Weeks    Status On-going    Target Date 03/07/2022     PT LONG TERM GOAL #4   Title Pt will improve FOTO score by 10 points or more demonstrating improved perceived functional ability    Baseline FOTO 7 on 10/17; 03/15/21: FOTO 12; 05/10/2021 06/21/2021= 1; 09/15/2021= 9; 12/13/2021= Will issue next visit    Time 12    Period Weeks    Status On-going    Target Date 03/07/2022     PT LONG TERM GOAL #5   Title Patient will perform sit to stand transfer with appropriate AD and max assist of 2 people with 75% consistency to prepare for pregait activities.    Baseline 09/15/2021= Patient unable to stand well- unable to clear his bottom off chair with Max assist of 2 persons. 12/13/2021- Goal not appropriate to try yet but will keep and roll over to next cert as shift continues to focus on transfers/standing   Time 12    Period Weeks    Status New    Target Date 03/07/2022   PT LONG TERM GOAL #6  Title Patient will tolerate sitting unsupported demonstrating erect sitting posture for 30 minutes with CGA to demonstrate improved back extensor strength and improved sitting tolerance.   Baseline 12/13/2021= Patient demonstrated unsupported sitting at edge of mat for approx 20 min;  12/29/2021- Patient performed approx 30 min of dynamic sitting activities today.   Time 12   Period Weeks   Status ONGOING  Target Date 03/07/2022            Plan     Clinical Impression Statement Treatment focused on LE strengthening as patient was too fatigued from OT session to try to stand. He was able to demo improved overall strength as seen by his ability to perform more resistive LE strengthening. He was able to perform some therex with either YTB or  1.5# AW today with improving right LE AROM. Patient will benefit from additional skilled PT intervention  to improve strength, balance, and mobility for improved quality of life.    Personal Factors and Comorbidities Comorbidity 3+;Time since onset of injury/illness/exacerbation    Comorbidities CVA, diabetes, Seizures    Examination-Activity Limitations Bathing;Bed Mobility;Bend;Caring for Others;Carry;Dressing;Hygiene/Grooming;Lift;Locomotion Level;Reach Overhead;Self Feeding;Sit;Squat;Stairs;Stand;Transfers;Toileting    Examination-Participation Restrictions Cleaning;Community Activity;Driving;Laundry;Medication Management;Meal Prep;Occupation;Personal Finances;Shop;Yard Work;Volunteer    Stability/Clinical Decision Making Evolving/Moderate complexity    Rehab Potential Fair    PT Frequency 2x / week    PT Duration 12 weeks    PT Treatment/Interventions ADLs/Self Care Home Management;Cryotherapy;Electrical Stimulation;Moist Heat;Ultrasound;DME Instruction;Gait training;Stair training;Functional mobility training;Therapeutic exercise;Balance training;Patient/family education;Orthotic Fit/Training;Neuromuscular re-education;Wheelchair mobility training;Manual techniques;Passive range of motion;Dry needling;Energy conservation;Taping;Visual/perceptual remediation/compensation;Joint Manipulations    PT Next Visit Plan core strength- sitting at EOM, postural control, sabina sit to stand if able; Continue with progressive LE Strengthening    PT Home Exercise Plan No changes to HEP today    Consulted and Agree with Plan of Care Patient;Family member/caregiver    Family Member Consulted Dad               Lewis Moccasin, PT 02/07/2022, 5:05 PM

## 2022-02-07 NOTE — Therapy (Signed)
OUTPATIENT PHYSICAL THERAPY TREATMENT NOTE/Physical Therapy Progress Note   Dates of reporting period  12/29/2021   to  02/07/2022 Patient Name: Paul Hall MRN: [id number] DOB:July 05, 1995, 26 y.o., male Today's Date: 02/07/2022  PCP:  Loistine Chance PROVIDER:  Collene Mares, PA-C   PT End of Session - 02/07/22 1532     Visit Number 70    Number of Visits 40    Date for PT Re-Evaluation 03/07/22    Authorization Type [employer]BS and [employer] Medicaid- Need auth past 12th visit    Authorization Time Period 01/04/21-03/29/21; Recert 62/22/9798-12/24/1939; Recert 7/40/8144- 11/02/8561; Recert 1/49/7026-37/11/5883    Progress Note Due on Visit 80    Equipment Utilized During Treatment Gait belt   Hoyer lift   Activity Tolerance Patient tolerated treatment well   queasiness/nausea   Behavior During Therapy WFL for tasks assessed/performed                 Past Medical History:  Diagnosis Date   Diabetes mellitus ([place])    Hypertension    Stroke [facility])    Past Surgical History:  Procedure Laterality Date   IVC FILTER PLACEMENT ([place] HX)     LEG SURGERY     PEG TUBE PLACEMENT     TRACHEOSTOMY     Patient Active Problem List   Diagnosis Date Noted   Sepsis due to vancomycin resistant Enterococcus species ([place]) 06/06/2019   SIRS (systemic inflammatory response syndrome) ([place]) 06/05/2019   Acute lower UTI 06/05/2019   VRE (vancomycin-resistant Enterococci) infection 06/05/2019   Anemia 06/05/2019   Skin ulcer of sacrum with necrosis of muscle ([place])    Urinary retention    Type 2 diabetes mellitus without complication, with long-term current use of insulin ([facility])    Tachycardia    Lower extremity edema    Acute metabolic encephalopathy    Obstructive sleep apnea    Morbid obesity with BMI of 60.0-69.9, adult ([place])    Goals of care, counseling/discussion    Palliative care encounter    Sepsis ([place]) 04/27/2019   H/O insulin dependent diabetes mellitus 04/27/2019   History of  CVA with residual deficit 04/27/2019   Seizure disorder ([place]) 04/27/2019   Decubitus ulcer of sacral region, stage 4 ([place]) 04/27/2019    REFERRING DIAG: Cerebral infarction, unspecified   THERAPY DIAG:  Muscle weakness (generalized)  Other lack of coordination  History of CVA with residual deficit  Difficulty in walking, not elsewhere classified  Unsteadiness on feet  Abnormality of gait and mobility  Other abnormalities of gait and mobility  Rationale for Evaluation and Treatment Rehabilitation  PERTINENT HISTORY: Dadrian Ballantine is a 25yoM who presents with severe weakness, quadriparesis, altered sensorium, and visual impairment s/p critical illness and prolonged hospitalization. Pt hospitalized in October 2020 with ARDS 2/2 COVID19 infection. Pt sustained a complex and lengthy hospitalization which included tracheostomy, prolonged sedation, ECMO. In this period pt sustained CVA and SDH. Pt has now been liberated from tracheostomy and G-tube. Pt has since been hospitalized for wound infection and UTI. Pt lives with parents at home, has hospital bed and left chair, hoyer lift transfers, and power WC for mobility needs. Pt needs heavy physical assistance with ADL 2/2 BUE contractures and motor dysfunction   PRECAUTIONS: Fall  SUBJECTIVE: Patient reports having an upset stomach today and states he would like to stand but not feeling up to trying due to upset stomach  Patient reports PAIN:  Are you having pain? No   TODAY'S TREATMENT:   Therapeutic  Activity:    Transfer- Hoyer from w/c to mat table (seated position) +2 assist.  Patient performed static sitting without back support for approx  3 min while negotiating exchanging hoyer for sit to stand lift.   Patient was lifted x 4 trials today with PT on one side and patient father on opp side. Patient was positioned into standing slowly- his arms were supported on both sides. He continued to require VC/TC to keep glutes engaged  and look ahead. He was more stable on 1st stand- able to breathe without excessive  anxiety. He was able to stand for 1 min 30 sec; 1 min; 35 sec and 30 sec respectively   Education provided throughout session via VC/TC and demonstration to facilitate movement at target joints and correct muscle activation for all testing and exercises performed.       PATIENT EDUCATION: Education details: posture cues with sitting Person educated: Patient Education method: Explanation, Demonstration, Tactile cues, and Verbal cues Education comprehension: verbalized understanding, returned demonstration, verbal cues required, tactile cues required, and needs further education   HOME EXERCISE PROGRAM:    PT Short Term Goals -       PT SHORT TERM GOAL #1   Title Pt will be independent with HEP in order to improve strength and balance in order to decrease fall risk and improve function at home and work.    Baseline 01/04/2021= No formal HEP in place; 12/12 no HEP in place; 05/10/2021-Patient and his father were able to report compliance with curent HEP consisting of mostly seated/reclined LE strengthening. Both verbalize no questions at this time.    Time 6    Period Weeks    Status Achieved    Target Date 02/15/21              PT Long Term Goals -      PT LONG TERM GOAL #1   Title Patient will increase BLE gross strength by 1/2 muscle grade to improve functional strength for improved independence with potential gait, increased standing tolerance and increased ADL ability.    Baseline 01/04/2021- Patient presents with 1/5 to 3-/5 B LE strength with MMT; 12/12: goal partially met for Left knee/hip; 05/10/2021= 2-/5 bilateal Hip flex; 3+/5 bilateral Knee ext; 06/21/2021= Patient presents with 2-/5 bilateral Hip flex; 3+/5 bilateral knee ext/flex; 2-/5 left ankle DF; 0/5 right ankle- and able to increase reps and resistance with LE's. 09/15/2021- Patient technically presents with 2-/5 B hip flex/abd/add -  but he is able to raise his hip up to approx 100 deg which has improved. 3+/5 Bilateral knee ext, 2-/5 left ankle and 0/5 right ankle. 12/08/2021= Patient able to lift left knee at 110 deg of hip flex; presents with 3+/5 knee ext, 2-/5 left ankle DF and 0/5 right ankle DF, 2-/5 bilateral Hip abd in seated position.    Time 12    Period Weeks    Status On-going    Target Date 03/07/2022     PT LONG TERM GOAL #2   Title Patient will tolerate sitting unsupported demonstrating erect sitting posture for 15 minutes with CGA to demonstrate improved back extensor strength and improved sitting tolerance.    Baseline 01/04/2021- Patient confied to sitting in lift chair or electric power chair with back support and unable to sit upright without physical assistance; 12/12: tolerates <1 minutes upright unsupported sitting. 05/10/2021=static sit with forward trunk lean  in his power wheelchair without back support x approx 3 min. 06/21/2021=Unable to assess today  due to patient with acute back pain but on previous visit able to sit x 8 min without back support. 09/15/2021- on last visit- 09/13/2021- patient was able to sit unsupported x 8 min at edge of mat. 10/13/2023 - Patient was able to sit at edge of mat with varying level of assist today from SBA to min A for a total of 20 min. 12/13/2021= Patient demonstrated unsupported sitting at edge of mat for approx 20 min   Time 12    Period Weeks    Status GOAL MET   Target Date 12/08/21      PT LONG TERM GOAL #3   Title Patient will demonstrate ability to perform static standing in // bars > 2 min with Max Assist  without loss of balance and fair posture for improved overall strength for pre-gait and transfer activities.    Baseline 01/04/2021= Patient current uanble to stand- Dependent on hoyer or sit to stand lift for transfers. 05/10/2021=Not appropriate yet- Currently still dependent with all transfers using hoyer. 06/21/2021= Patient continuing now to focus on LE  strengthening to prepare for standing-unable to try today due to acute low back pain-  planning on attempting in new cert period. 09/15/2021- Patient has attempted standing 2x in past two week- max Assist of 2 people - only once was he successful to clearing his bottom from chair - Will continue to be a focus during the new certification. 12/13/2021= Patient has been limited secondary to increased overall low back pain during this certification and will require more time to focus on this goal.    Time 12    Period Weeks    Status On-going    Target Date 03/07/2022     PT LONG TERM GOAL #4   Title Pt will improve FOTO score by 10 points or more demonstrating improved perceived functional ability    Baseline FOTO 7 on 10/17; 03/15/21: FOTO 12; 05/10/2021 06/21/2021= 1; 09/15/2021= 9; 12/13/2021= Will issue next visit    Time 12    Period Weeks    Status On-going    Target Date 03/07/2022     PT LONG TERM GOAL #5   Title Patient will perform sit to stand transfer with appropriate AD and max assist of 2 people with 75% consistency to prepare for pregait activities.    Baseline 09/15/2021= Patient unable to stand well- unable to clear his bottom off chair with Max assist of 2 persons. 12/13/2021- Goal not appropriate to try yet but will keep and roll over to next cert as shift continues to focus on transfers/standing   Time 12    Period Weeks    Status New    Target Date 03/07/2022   PT LONG TERM GOAL #6  Title Patient will tolerate sitting unsupported demonstrating erect sitting posture for 30 minutes with CGA to demonstrate improved back extensor strength and improved sitting tolerance.   Baseline 12/13/2021= Patient demonstrated unsupported sitting at edge of mat for approx 20 min;  12/29/2021- Patient performed approx 30 min of dynamic sitting activities today.   Time 12   Period Weeks   Status ONGOING  Target Date 03/07/2022            Plan     Clinical Impression Statement Patient's condition  has the potential to improve in response to therapy. Maximum improvement is yet to be obtained. The anticipated improvement is attainable and reasonable in a generally predictable time.  Patient reports   Patient would benefit from additional  skilled PT intervention to improve strength, balance, and mobility for improved quality of life.    Personal Factors and Comorbidities Comorbidity 3+;Time since onset of injury/illness/exacerbation    Comorbidities CVA, diabetes, Seizures    Examination-Activity Limitations Bathing;Bed Mobility;Bend;Caring for Others;Carry;Dressing;Hygiene/Grooming;Lift;Locomotion Level;Reach Overhead;Self Feeding;Sit;Squat;Stairs;Stand;Transfers;Toileting    Examination-Participation Restrictions Cleaning;Community Activity;Driving;Laundry;Medication Management;Meal Prep;Occupation;Personal Finances;Shop;Yard Work;Volunteer    Stability/Clinical Decision Making Evolving/Moderate complexity    Rehab Potential Fair    PT Frequency 2x / week    PT Duration 12 weeks    PT Treatment/Interventions ADLs/Self Care Home Management;Cryotherapy;Electrical Stimulation;Moist Heat;Ultrasound;DME Instruction;Gait training;Stair training;Functional mobility training;Therapeutic exercise;Balance training;Patient/family education;Orthotic Fit/Training;Neuromuscular re-education;Wheelchair mobility training;Manual techniques;Passive range of motion;Dry needling;Energy conservation;Taping;Visual/perceptual remediation/compensation;Joint Manipulations    PT Next Visit Plan core strength- sitting at EOM, postural control, sabina sit to stand if able; Continue with progressive LE Strengthening    PT Home Exercise Plan No changes to HEP today    Consulted and Agree with Plan of Care Patient;Family member/caregiver    Family Member Consulted Dad               Lewis Moccasin, PT 02/07/2022, 3:34 PM

## 2022-02-08 NOTE — Therapy (Addendum)
OUTPATIENT OCCUPATIONAL THERAPY TREATMENT  Patient Name: Paul Hall MRN: [id number] DOB:Aug 05, 1995, 26 y.o., male Today's Date: 02/02/2022  PCP: Dr. Sherwood Gambler REFERRING PROVIDER: Dr. Sherwood Gambler   OT End of Session - 02/02/22 1552     Visit Number 4   Number of Visits 12    Date for OT Re-Evaluation 04/11/22    Authorization Type Progress report period starting 10/06/2021    OT Start Time 1435    OT Stop Time 1515    OT Time Calculation (min) 40 min    Activity Tolerance Patient tolerated treatment well    Behavior During Therapy WFL for tasks assessed/performed               Past Medical History:  Diagnosis Date   Diabetes mellitus (HCC)    Hypertension    Stroke [facility])    Past Surgical History:  Procedure Laterality Date   IVC FILTER PLACEMENT ([facility] HX)     LEG SURGERY     PEG TUBE PLACEMENT     TRACHEOSTOMY     Patient Active Problem List   Diagnosis Date Noted   Sepsis due to vancomycin resistant Enterococcus species (HCC) 06/06/2019   SIRS (systemic inflammatory response syndrome) (HCC) 06/05/2019   Acute lower UTI 06/05/2019   VRE (vancomycin-resistant Enterococci) infection 06/05/2019   Anemia 06/05/2019   Skin ulcer of sacrum with necrosis of muscle (HCC)    Urinary retention    Type 2 diabetes mellitus without complication, with long-term current use of insulin (HCC)    Tachycardia    Lower extremity edema    Acute metabolic encephalopathy    Obstructive sleep apnea    Morbid obesity with BMI of 60.0-69.9, adult (HCC)    Goals of care, counseling/discussion    Palliative care encounter    Sepsis (HCC) 04/27/2019   H/O insulin dependent diabetes mellitus 04/27/2019   History of CVA with residual deficit 04/27/2019   Seizure disorder (HCC) 04/27/2019   Decubitus ulcer of sacral region, stage 4 (HCC) 04/27/2019    ONSET DATE: 01/2019  REFERRING DIAG: CVA/COVID-19  THERAPY DIAG:  Muscle weakness (generalized)  Other lack of  coordination  Rationale for Evaluation and Treatment Rehabilitation  SUBJECTIVE:   Patient reports that he fed himself more of his lunch this afternoon.   SUBJECTIVE STATEMENT: Pt. Was accompanied by his father Pt accompanied by: self and family member  PERTINENT HISTORY:  Pt. Is a 26 y.o. male who was diagnosed with COVID-19, and CVA with resultant quadriplegia. Pt. Was then hospitalized with VRE UTI. PMHx includes: urinary retention, seizure disorder, obstructive sleep disorder, DM Type II, Morbid obesity.   PRECAUTIONS: None  WEIGHT BEARING RESTRICTIONS No  PAIN:  Are you having pain? No  FALLS: Has patient fallen in last 6 months? No  LIVING ENVIRONMENT: Lives with: lives with their family Lives in: House/apartment Stairs: No Level Entry Has following equipment at home: Wheelchair (power) and hoyer lift, sit to stand lift  PLOF: Independent  PATIENT GOALS: To be able to engage in more daily care tasks.  OBJECTIVE:   HAND DOMINANCE: Right  ADLs:  Transfers/ambulation related to ADLs: Eating: Pt. Reports being able to hold standard utensils, and is starting to engage more in self-feeding tasks, hand to mouth patterns. Pt. Reports that he does as much as he can with the task, and family assists  with the remainder of the task. Grooming: Pt. Is able to initiate holding an electric toothbrush, and brush his teeth. Family assists LB Dressing:  Total Assist UE dressing: Pt. is now able to reach up to actively assist with grasping , and pulling his gown down. Toileting:  Total Assist Bathing: MaxA UB, Total assist LB Tub Shower transfers: N/A Equipment: See above    IADLs: Shopping: Relies on family to assist Light housekeeping: Total Assist Meal Prep: Total Assist Community mobility:   Medication management:  Total Assist  Financial management: N/A Handwriting: Not legible: Pt. Is able to hold a pen with the left hand, and initiate marking the page. Pt.'s eye  glasses were not available  MOBILITY STATUS:  Power w/c  POSTURE COMMENTS:  Pt. Requires position changes in his power w/c  ACTIVITY TOLERANCE: Activity tolerance:  Fair  FUNCTIONAL OUTCOME MEASURES: FOTO: 40  TR score: 45  UPPER EXTREMITY ROM     Active ROM Right eval Left eval  Shoulder flexion 106 scaption 119  Shoulder abduction 114 110  Shoulder adduction    Shoulder extension    Shoulder internal rotation    Shoulder external rotation    Elbow flexion 120(130) 135(135)  Elbow extension -45(-35) -27(-18)  Wrist flexion    Wrist extension -30(10) 10(50  Wrist ulnar deviation    Wrist radial deviation    Wrist pronation    Wrist supination    (Blank rows = not tested)   UPPER EXTREMITY MMT:     Right eval Left eval  Shoulder flexion 3-/5 scaption 3-/5  Shoulder abduction 3-/5 3-/5  Shoulder adduction    Shoulder extension    Shoulder internal rotation    Shoulder external rotation    Elbow flexion 3/5 3/5  Elbow extension 2-/5 2-/5  Wrist flexion    Wrist extension 2-/5 2/5  Wrist ulnar deviation    Wrist radial deviation    Wrist pronation    Wrist supination    (Blank rows = not tested)    HAND FUNCTION:  Grip strength: Right: 0 lbs; Left: 0 lbs and Lateral pinch: Right: 5 lbs, Left: 2 lbs  Bilateral digit PIP/DIP flexion contractures with MP hyperextension with attempts for AROM. Pt. is able to tolerate AROM to the bilateral digits at the initial evaluation however, has a history of pain in the digits.  COORDINATION:  Pt. is unable to grasp 9-hole test pegs. Pt. is able to initiate grasping larger pegs, and is able to hold a pen in the left hand.  SENSATION: Light touch: Impaired   EDEMA:  N/A  MUSCLE TONE: BUE flexor Spasticity  COGNITIO Overall cognitive status: Continue to assess in functional context  VISION:   Subjective report: Pt. Was not wearing glasses at the time of the initial eval.  Baseline vision: Vision is very  limited. Wears glasses all the time Visual history: History of impaired vision following CVA. Pt. Has received treatment through the [facility] low vision rehabilitation program.   VISION ASSESSMENT: Impaired To be further assessed in functional context  PERCEPTION: Impaired   PRAXIS: Impaired: motor planning  OBSERVATIONS:  Pt reports being on  Tramadol   TODAY'S TREATMENT:   Neuromuscular reeducation:  Pt. worked on tasks promoting bilateral UE midline orientation. Pt. worked on stabilizing items with the right hand in midline, while using his left hand to open various containers. Pt. worked on opening, and closing a Field seismologist while engaging wrist radial, and ulnar deviation. Pt. worked on pressing  various container closures. Pt. worked on bilateral hand tasks in midline connecting, and disconnecting piping fasteners. Pt. worked on progressing from Building services engineer fixtures to  shorter requiring the bilateral hands to be in midline longer.    Pt. reports that he fed himself more of his lunch prior to therapy to day, requiring less assist form his father. Pt. ate handheld foods vs. foods requiring utensils during lunch. Pt. Was able was able to turn a large mouth jar lid to open, and close it requiring verbal cues, and tactile cues, for left hand finger, thumb, and 2nd digit position, as well as engaging the right hand to stabilize the items on the tabletop in midline.  Pt. Continues to work on improving bilateral upper extremity functioning in midline in preparation for improving engagement in ADL, and IADL tasks.     Marland Kitchen  PATIENT EDUCATION: Education details: OT services, POC, Goals Person educated: Patient and Parent Education method: Explanation, Demonstration, Tactile cues, and Verbal cues Education comprehension: verbalized understanding, returned demonstration, verbal cues required, and needs further education   HOME EXERCISE PROGRAM:   Continue ongoing assessment, and  continue to provide as needed.     GOALS: Goals reviewed with patient? Yes  SHORT TERM GOALS: Target date: 02/14/2022    To assess splint fit, and make appropriate adjustments to promote good skin integrity through the palmar surface of the bilateral hands.  Baseline: Bilateral resting hand splints. Goal status: INITIAL   LONG TERM GOALS: Target date: 03/14/2022     FOTO score will Improve by 2 points for Pt. perceived improvement with the assessment specific ADL/IADL tasks.  Baseline: Eval: FOTO score: 40,  TR score: 45 Goal status: INITIAL  2.   Pt. will independently perform oral care for 100% of the task after complete set-up. Baseline: Eval: Pt. is able to initiate using an electric toothbrush. Pt. requires assist for set-up, and assist for thoroughness, and as  thePt. fatigues. Goal status: INITIAL  3.  Pt. Will be independent with self-feeding for 100% of the meal after complete set-up Baseline: Eval: Pt. is able to hold standard standard utensils. Pt. Performs as much of the task as he, can and has assistance for the remainder. Goal status: INITIAL  4.  Pt. Will improve grasp patterns and consistently grasp 1/2" objects for ADL, and IADL tasks.  Baseline: Eval: Pt. is able to grasp 1" objects intermittently using a lateral grasp pattern. Goal status: INITIAL  5.  Pt. Will legibly write one, out of 3 letters of his name legibly. Baseline: Eval: Pt is able to hold a thin marker with his left hand, and formulate a a line, and initiate a circular pattern (Pt. without glasses today) Goal status: INITIAL  6. Pt. Will reach up to comb/brush his hair  with minA.     Baseline: Pt. is able to initiate reaching up for hair care with a long handled brush, however is unable to sustain UE's in elevation to perform the task.     Goal status: INITIAL      ASSESSMENT:  CLINICAL IMPRESSION:  Pt. reports that he fed himself more of his lunch prior to therapy to day, requiring  less assist form his father. Pt. ate handheld foods vs. foods requiring utensils during lunch. Pt. Was able was able to turn a large mouth jar lid to open, and close it requiring verbal cues, and tactile cues, for left hand finger, thumb, and 2nd digit position, as well as engaging the right hand to stabilize the items on the tabletop in midline.  Pt. Continues to work on improving bilateral upper extremity functioning in midline in preparation for improving  engagement in ADL, and IADL tasks.   PERFORMANCE DEFICITS in functional skills including ADLs, IADLs, coordination, dexterity, proprioception, ROM, strength, pain, [place], GMC, decreased knowledge of use of DME, and UE functional use, cognitive skills including safety awareness, and psychosocial skills including coping strategies, environmental adaptation, habits, and routines and behaviors.   IMPAIRMENTS are limiting patient from ADLs, IADLs, leisure, and social participation.   COMORBIDITIES may have co-morbidities  that affects occupational performance. Patient will benefit from skilled OT to address above impairments and improve overall function.  MODIFICATION OR ASSISTANCE TO COMPLETE EVALUATION: Maximum or significant modification of tasks or assist is necessary to complete an evaluation.  OT OCCUPATIONAL PROFILE AND HISTORY: Comprehensive assessment: Review of records and extensive additional review of physical, cognitive, psychosocial history related to current functional performance.  CLINICAL DECISION MAKING: High - multiple treatment options, significant modification of task necessary  REHAB POTENTIAL: Fair    EVALUATION COMPLEXITY: High    PLAN: OT FREQUENCY: 1x/week  OT DURATION:8 weeks  PLANNED INTERVENTIONS: self care/ADL training, therapeutic exercise, therapeutic activity, neuromuscular re-education, manual therapy, passive range of motion, patient/family education, and cognitive remediation/compensation  RECOMMENDED  OTHER SERVICES: PT  CONSULTED AND AGREED WITH PLAN OF CARE: Patient and family member/caregiver  PLAN FOR NEXT SESSION: Initiate treatment   Harrel Carina, MS, OTR/L   Harrel Carina, OT 02/02/2022, 3:54 PM

## 2022-02-09 ENCOUNTER — Ambulatory Visit: Payer: BC Managed Care – PPO

## 2022-02-09 DIAGNOSIS — M6281 Muscle weakness (generalized): Secondary | ICD-10-CM

## 2022-02-09 DIAGNOSIS — R262 Difficulty in walking, not elsewhere classified: Secondary | ICD-10-CM

## 2022-02-09 DIAGNOSIS — I6930 Unspecified sequelae of cerebral infarction: Secondary | ICD-10-CM

## 2022-02-09 DIAGNOSIS — H543 Unqualified visual loss, both eyes: Secondary | ICD-10-CM | POA: Diagnosis not present

## 2022-02-09 DIAGNOSIS — R2689 Other abnormalities of gait and mobility: Secondary | ICD-10-CM

## 2022-02-09 DIAGNOSIS — R278 Other lack of coordination: Secondary | ICD-10-CM

## 2022-02-09 DIAGNOSIS — R2681 Unsteadiness on feet: Secondary | ICD-10-CM

## 2022-02-09 DIAGNOSIS — R269 Unspecified abnormalities of gait and mobility: Secondary | ICD-10-CM

## 2022-02-09 NOTE — Therapy (Signed)
OUTPATIENT PHYSICAL THERAPY TREATMENT NOTE Patient Name: Paul Hall MRN: [id number] DOB:[birth year], 26 y.o., male Today's Date: 02/09/2022  PCP:  Loistine Chance PROVIDER:  Collene Mares, PA-C   PT End of Session - 02/09/22 1556     Visit Number 71    Number of Visits 46    Date for PT Re-Evaluation 03/07/22    Authorization Type [employer] and [employer]- Need auth past 12th visit    Authorization Time Period 01/04/21-03/29/21; Recert 32/44/0102-10/27/3662; Recert 07/05/4740- 08/10/5636; Recert 7/56/4332-95/04/8839    Progress Note Due on Visit 65    PT Start Time 1507    PT Stop Time 1550    PT Time Calculation (min) 43 min    Equipment Utilized During Treatment Gait belt   Hoyer lift   Activity Tolerance Patient tolerated treatment well   queasiness/nausea   Behavior During Therapy WFL for tasks assessed/performed                 Past Medical History:  Diagnosis Date   Diabetes mellitus ([place])    Hypertension    Stroke [facility])    Past Surgical History:  Procedure Laterality Date   IVC FILTER PLACEMENT ([place] HX)     LEG SURGERY     PEG TUBE PLACEMENT     TRACHEOSTOMY     Patient Active Problem List   Diagnosis Date Noted   Sepsis due to vancomycin resistant Enterococcus species ([place]) 06/06/2019   SIRS (systemic inflammatory response syndrome) ([place]) 06/05/2019   Acute lower UTI 06/05/2019   VRE (vancomycin-resistant Enterococci) infection 06/05/2019   Anemia 06/05/2019   Skin ulcer of sacrum with necrosis of muscle ([place])    Urinary retention    Type 2 diabetes mellitus without complication, with long-term current use of insulin (HCC)    Tachycardia    Lower extremity edema    Acute metabolic encephalopathy    Obstructive sleep apnea    Morbid obesity with BMI of 60.0-69.9, adult ([place])    Goals of care, counseling/discussion    Palliative care encounter    Sepsis ([place]) 04/27/2019   H/O insulin dependent diabetes mellitus 04/27/2019   History of CVA  with residual deficit 04/27/2019   Seizure disorder ([place]) 04/27/2019   Decubitus ulcer of sacral region, stage 4 ([place]) 04/27/2019    REFERRING DIAG: Cerebral infarction, unspecified   THERAPY DIAG:  Muscle weakness (generalized)  Other lack of coordination  History of CVA with residual deficit  Difficulty in walking, not elsewhere classified  Unsteadiness on feet  Abnormality of gait and mobility  Other abnormalities of gait and mobility  Rationale for Evaluation and Treatment Rehabilitation  PERTINENT HISTORY: Paul Hall is a 25yoM who presents with severe weakness, quadriparesis, altered sensorium, and visual impairment s/p critical illness and prolonged hospitalization. Pt hospitalized in October 2020 with ARDS 2/2 COVID19 infection. Pt sustained a complex and lengthy hospitalization which included tracheostomy, prolonged sedation, ECMO. In this period pt sustained CVA and SDH. Pt has now been liberated from tracheostomy and G-tube. Pt has since been hospitalized for wound infection and UTI. Pt lives with parents at home, has hospital bed and left chair, hoyer lift transfers, and power WC for mobility needs. Pt needs heavy physical assistance with ADL 2/2 BUE contractures and motor dysfunction   PRECAUTIONS: Fall  SUBJECTIVE: Patient reports doing well today with no new issues. Reports very motivated and trying to do his best. Reports compliant with exercises and standing at home.    Patient reports PAIN:  Are  you having pain? No   TODAY'S TREATMENT:   Therapeutic Activity:    Transfer- Hoyer from w/c to mat table (seated position) +2 assist.  Patient performed static sitting without back support for  while negotiating exchanging hoyer for sit to stand lift and 5 min prior to standing to focus on sitting balance as well as between standing trials.    Patient standing with sit to stand lift  x 4 trials today with PT on one side and patient father on opp side. Patient  was positioned into standing slowly- his arms were supported on both sides. Patient able to stand and execute improved breathing today - still very forward flexed with min gluteal activation for standing posture but overall much improved He was more stable on 1st trial for 2 min 20 sec - A personal record in PT session- able to breathe without excessive  anxiety. He was able to stand for another 1 min 30 sec; 1 min; 30 sec respectively. Patient was trying to perform one last trial but too fatigued to continue.       Education provided throughout session via VC/TC and demonstration to facilitate movement at target joints and correct muscle activation for all testing and exercises performed.       PATIENT EDUCATION: Education details: posture cues with sitting Person educated: Patient Education method: Explanation, Demonstration, Tactile cues, and Verbal cues Education comprehension: verbalized understanding, returned demonstration, verbal cues required, tactile cues required, and needs further education   HOME EXERCISE PROGRAM:    PT Short Term Goals -       PT SHORT TERM GOAL #1   Title Pt will be independent with HEP in order to improve strength and balance in order to decrease fall risk and improve function at home and work.    Baseline 01/04/2021= No formal HEP in place; 12/12 no HEP in place; 05/10/2021-Patient and his father were able to report compliance with curent HEP consisting of mostly seated/reclined LE strengthening. Both verbalize no questions at this time.    Time 6    Period Weeks    Status Achieved    Target Date 02/15/21              PT Long Term Goals -      PT LONG TERM GOAL #1   Title Patient will increase BLE gross strength by 1/2 muscle grade to improve functional strength for improved independence with potential gait, increased standing tolerance and increased ADL ability.    Baseline 01/04/2021- Patient presents with 1/5 to 3-/5 B LE strength with MMT;  12/12: goal partially met for Left knee/hip; 05/10/2021= 2-/5 bilateal Hip flex; 3+/5 bilateral Knee ext; 06/21/2021= Patient presents with 2-/5 bilateral Hip flex; 3+/5 bilateral knee ext/flex; 2-/5 left ankle DF; 0/5 right ankle- and able to increase reps and resistance with LE's. 09/15/2021- Patient technically presents with 2-/5 B hip flex/abd/add - but he is able to raise his hip up to approx 100 deg which has improved. 3+/5 Bilateral knee ext, 2-/5 left ankle and 0/5 right ankle. 12/08/2021= Patient able to lift left knee at 110 deg of hip flex; presents with 3+/5 knee ext, 2-/5 left ankle DF and 0/5 right ankle DF, 2-/5 bilateral Hip abd in seated position.    Time 12    Period Weeks    Status On-going    Target Date 03/07/2022     PT LONG TERM GOAL #2   Title Patient will tolerate sitting unsupported demonstrating erect sitting posture  for 15 minutes with CGA to demonstrate improved back extensor strength and improved sitting tolerance.    Baseline 01/04/2021- Patient confied to sitting in lift chair or electric power chair with back support and unable to sit upright without physical assistance; 12/12: tolerates <1 minutes upright unsupported sitting. 05/10/2021=static sit with forward trunk lean  in his power wheelchair without back support x approx 3 min. 06/21/2021=Unable to assess today due to patient with acute back pain but on previous visit able to sit x 8 min without back support. 09/15/2021- on last visit- 09/13/2021- patient was able to sit unsupported x 8 min at edge of mat. 10/13/2023 - Patient was able to sit at edge of mat with varying level of assist today from SBA to min A for a total of 20 min. 12/13/2021= Patient demonstrated unsupported sitting at edge of mat for approx 20 min   Time 12    Period Weeks    Status GOAL MET   Target Date 12/08/21      PT LONG TERM GOAL #3   Title Patient will demonstrate ability to perform static standing in // bars > 2 min with Max Assist  without loss of  balance and fair posture for improved overall strength for pre-gait and transfer activities.    Baseline 01/04/2021= Patient current uanble to stand- Dependent on hoyer or sit to stand lift for transfers. 05/10/2021=Not appropriate yet- Currently still dependent with all transfers using hoyer. 06/21/2021= Patient continuing now to focus on LE strengthening to prepare for standing-unable to try today due to acute low back pain-  planning on attempting in new cert period. 09/15/2021- Patient has attempted standing 2x in past two week- max Assist of 2 people - only once was he successful to clearing his bottom from chair - Will continue to be a focus during the new certification. 12/13/2021= Patient has been limited secondary to increased overall low back pain during this certification and will require more time to focus on this goal.    Time 12    Period Weeks    Status On-going    Target Date 03/07/2022     PT LONG TERM GOAL #4   Title Pt will improve FOTO score by 10 points or more demonstrating improved perceived functional ability    Baseline FOTO 7 on 10/17; 03/15/21: FOTO 12; 05/10/2021 06/21/2021= 1; 09/15/2021= 9; 12/13/2021= Will issue next visit    Time 12    Period Weeks    Status On-going    Target Date 03/07/2022     PT LONG TERM GOAL #5   Title Patient will perform sit to stand transfer with appropriate AD and max assist of 2 people with 75% consistency to prepare for pregait activities.    Baseline 09/15/2021= Patient unable to stand well- unable to clear his bottom off chair with Max assist of 2 persons. 12/13/2021- Goal not appropriate to try yet but will keep and roll over to next cert as shift continues to focus on transfers/standing   Time 12    Period Weeks    Status New    Target Date 03/07/2022   PT LONG TERM GOAL #6  Title Patient will tolerate sitting unsupported demonstrating erect sitting posture for 30 minutes with CGA to demonstrate improved back extensor strength and improved  sitting tolerance.   Baseline 12/13/2021= Patient demonstrated unsupported sitting at edge of mat for approx 20 min;  12/29/2021- Patient performed approx 30 min of dynamic sitting activities today.   Time 12  Period Weeks   Status ONGOING  Target Date 03/07/2022            Plan     Clinical Impression Statement Patient was well motivated for today's session- denying any pain or issues. He was positioned onto mat table and then into standing position with lift. He had his best day as far as time on his feet as well as breathing. He was very fatigued upon completion of today's activities involving standing. Patient will benefit from additional skilled PT intervention to improve strength, balance, and mobility for improved quality of life.    Personal Factors and Comorbidities Comorbidity 3+;Time since onset of injury/illness/exacerbation    Comorbidities CVA, diabetes, Seizures    Examination-Activity Limitations Bathing;Bed Mobility;Bend;Caring for Others;Carry;Dressing;Hygiene/Grooming;Lift;Locomotion Level;Reach Overhead;Self Feeding;Sit;Squat;Stairs;Stand;Transfers;Toileting    Examination-Participation Restrictions Cleaning;Community Activity;Driving;Laundry;Medication Management;Meal Prep;Occupation;Personal Finances;Shop;Yard Work;Volunteer    Stability/Clinical Decision Making Evolving/Moderate complexity    Rehab Potential Fair    PT Frequency 2x / week    PT Duration 12 weeks    PT Treatment/Interventions ADLs/Self Care Home Management;Cryotherapy;Electrical Stimulation;Moist Heat;Ultrasound;DME Instruction;Gait training;Stair training;Functional mobility training;Therapeutic exercise;Balance training;Patient/family education;Orthotic Fit/Training;Neuromuscular re-education;Wheelchair mobility training;Manual techniques;Passive range of motion;Dry needling;Energy conservation;Taping;Visual/perceptual remediation/compensation;Joint Manipulations    PT Next Visit Plan core strength-  sitting at EOM, postural control, sabina sit to stand if able; Continue with progressive LE Strengthening    PT Home Exercise Plan No changes to HEP today    Consulted and Agree with Plan of Care Patient;Family member/caregiver    Family Member Consulted Dad               Lewis Moccasin, PT 02/09/2022, 5:06 PM

## 2022-02-14 ENCOUNTER — Ambulatory Visit: Payer: BC Managed Care – PPO

## 2022-02-14 ENCOUNTER — Ambulatory Visit: Payer: BC Managed Care – PPO | Admitting: Occupational Therapy

## 2022-02-14 DIAGNOSIS — R2689 Other abnormalities of gait and mobility: Secondary | ICD-10-CM | POA: Diagnosis not present

## 2022-02-14 DIAGNOSIS — H543 Unqualified visual loss, both eyes: Secondary | ICD-10-CM | POA: Diagnosis not present

## 2022-02-14 DIAGNOSIS — M6281 Muscle weakness (generalized): Secondary | ICD-10-CM | POA: Diagnosis not present

## 2022-02-14 DIAGNOSIS — I6930 Unspecified sequelae of cerebral infarction: Secondary | ICD-10-CM | POA: Diagnosis not present

## 2022-02-14 DIAGNOSIS — R269 Unspecified abnormalities of gait and mobility: Secondary | ICD-10-CM | POA: Diagnosis not present

## 2022-02-14 DIAGNOSIS — R262 Difficulty in walking, not elsewhere classified: Secondary | ICD-10-CM

## 2022-02-14 DIAGNOSIS — R278 Other lack of coordination: Secondary | ICD-10-CM | POA: Diagnosis not present

## 2022-02-14 DIAGNOSIS — R2681 Unsteadiness on feet: Secondary | ICD-10-CM

## 2022-02-14 NOTE — Therapy (Signed)
OUTPATIENT OCCUPATIONAL THERAPY TREATMENT  Patient Name: Paul Hall MRN: [id number] DOB:[birth year], 26 y.o., male Today's Date: 02/14/2022  PCP: Dr. Gerarda Fraction REFERRING PROVIDER: Dr. Gerarda Fraction   OT End of Session - 02/14/22 1734     Visit Number 5    Number of Visits 12    Date for OT Re-Evaluation 04/11/22    Authorization Type Progress report period starting 10/06/2021    OT Start Time 1645    OT Stop Time 1730    OT Time Calculation (min) 45 min    Equipment Utilized During Treatment tilt in space power wc    Activity Tolerance Patient tolerated treatment well    Behavior During Therapy WFL for tasks assessed/performed               Past Medical History:  Diagnosis Date   Diabetes mellitus ([place])    Hypertension    Stroke [facility])    Past Surgical History:  Procedure Laterality Date   IVC FILTER PLACEMENT ([place] HX)     LEG SURGERY     PEG TUBE PLACEMENT     TRACHEOSTOMY     Patient Active Problem List   Diagnosis Date Noted   Sepsis due to vancomycin resistant Enterococcus species ([place]) 06/06/2019   SIRS (systemic inflammatory response syndrome) ([place]) 06/05/2019   Acute lower UTI 06/05/2019   VRE (vancomycin-resistant Enterococci) infection 06/05/2019   Anemia 06/05/2019   Skin ulcer of sacrum with necrosis of muscle ([place])    Urinary retention    Type 2 diabetes mellitus without complication, with long-term current use of insulin ([facility])    Tachycardia    Lower extremity edema    Acute metabolic encephalopathy    Obstructive sleep apnea    Morbid obesity with BMI of 60.0-69.9, adult ([place])    Goals of care, counseling/discussion    Palliative care encounter    Sepsis ([place]) 04/27/2019   H/O insulin dependent diabetes mellitus 04/27/2019   History of CVA with residual deficit 04/27/2019   Seizure disorder ([place]) 04/27/2019   Decubitus ulcer of sacral region, stage 4 ([place]) 04/27/2019    ONSET DATE: 01/2019  REFERRING DIAG: CVA/COVID-19  THERAPY DIAG:   Muscle weakness (generalized)  Low vision, both eyes  Other lack of coordination  Rationale for Evaluation and Treatment Rehabilitation  SUBJECTIVE:   Patient reports that he fed himself more of his lunch this afternoon.   SUBJECTIVE STATEMENT:  Pt. reports not feeling well today, and that it is harder for him to see when he isn't feeling well.  Pt accompanied by: self and family member  PERTINENT HISTORY:  Pt. is a 26 y.o. male who was diagnosed with COVID-19, and CVA with resultant quadriplegia. Pt. Was then hospitalized with VRE UTI. PMHx includes: urinary retention, seizure disorder, obstructive sleep disorder, DM Type II, Morbid obesity.   PRECAUTIONS: None  WEIGHT BEARING RESTRICTIONS No  PAIN:  Are you having pain? No  FALLS: Has patient fallen in last 6 months? No  LIVING ENVIRONMENT: Lives with: lives with their family Lives in: House/apartment Stairs: No Level Entry Has following equipment at home: Wheelchair (power) and hoyer lift, sit to stand lift  PLOF: Independent  PATIENT GOALS: To be able to engage in more daily care tasks.  OBJECTIVE:   HAND DOMINANCE: Right  ADLs:  Transfers/ambulation related to ADLs: Eating: Pt. Reports being able to hold standard utensils, and is starting to engage more in self-feeding tasks, hand to mouth patterns. Pt. Reports that he does as much  as he can with the task, and family assists  with the remainder of the task. Grooming: Pt. Is able to initiate holding an electric toothbrush, and brush his teeth. Family assists LB Dressing: Total Assist UE dressing: Pt. is now able to reach up to actively assist with grasping , and pulling his gown down. Toileting:  Total Assist Bathing: MaxA UB, Total assist LB Tub Shower transfers: N/A Equipment: See above    IADLs: Shopping: Relies on family to assist Light housekeeping: Total Assist Meal Prep: Total Assist Community mobility:   Medication management:  Total Assist   Financial management: N/A Handwriting: Not legible: Pt. Is able to hold a pen with the left hand, and initiate marking the page. Pt.'s eye glasses were not available  MOBILITY STATUS:  Power w/c  POSTURE COMMENTS:  Pt. Requires position changes in his power w/c  ACTIVITY TOLERANCE: Activity tolerance:  Fair  FUNCTIONAL OUTCOME MEASURES: FOTO: 40  TR score: 45  UPPER EXTREMITY ROM     Active ROM Right eval Left eval  Shoulder flexion 106 scaption 119  Shoulder abduction 114 110  Shoulder adduction    Shoulder extension    Shoulder internal rotation    Shoulder external rotation    Elbow flexion 120(130) 135(135)  Elbow extension -45(-35) -27(-18)  Wrist flexion    Wrist extension -30(10) 10(50  Wrist ulnar deviation    Wrist radial deviation    Wrist pronation    Wrist supination    (Blank rows = not tested)   UPPER EXTREMITY MMT:     Right eval Left eval  Shoulder flexion 3-/5 scaption 3-/5  Shoulder abduction 3-/5 3-/5  Shoulder adduction    Shoulder extension    Shoulder internal rotation    Shoulder external rotation    Elbow flexion 3/5 3/5  Elbow extension 2-/5 2-/5  Wrist flexion    Wrist extension 2-/5 2/5  Wrist ulnar deviation    Wrist radial deviation    Wrist pronation    Wrist supination    (Blank rows = not tested)    HAND FUNCTION:  Grip strength: Right: 0 lbs; Left: 0 lbs and Lateral pinch: Right: 5 lbs, Left: 2 lbs  Bilateral digit PIP/DIP flexion contractures with MP hyperextension with attempts for AROM. Pt. is able to tolerate AROM to the bilateral digits at the initial evaluation however, has a history of pain in the digits.  COORDINATION:  Pt. is unable to grasp 9-hole test pegs. Pt. is able to initiate grasping larger pegs, and is able to hold a pen in the left hand.  SENSATION: Light touch: Impaired   EDEMA:  N/A  MUSCLE TONE: BUE flexor Spasticity  COGNITIO Overall cognitive status: Continue to assess in functional  context  VISION:   Subjective report: Pt. Was not wearing glasses at the time of the initial eval.  Baseline vision: Vision is very limited. Wears glasses all the time Visual history: History of impaired vision following CVA. Pt. Has received treatment through the [facility] low vision rehabilitation program.   VISION ASSESSMENT: Impaired To be further assessed in functional context  PERCEPTION: Impaired   PRAXIS: Impaired: motor planning  OBSERVATIONS:  Pt reports being on  Tramadol   TODAY'S TREATMENT:   Neuromuscular reeducation:  Pt. worked on grasping 1" cubes from a vertical resistive board. Pt. grasped with his left 2nd digit using a hook grasp. Pt. required visual cues.  Therapeutic Ex.:     Pt. worked on bilateral UE functional reaching using  a lateral grasp to grasp Saebo rings, and reach out to move them through 3 horizontal rungs       of  progressively increasing heights. Pt. worked on reaching out to slide the [employer] through [employer].   Pt. reports that he fed himself a bag of chips, and that he also used utensils some to feed himself over the weekend. Pt. reports visual limitations today secondary to not feeling well. Pt. was able to accurately grasp 5 dark resistive cubes from the vertical resistive board. With visual cues, and proximal LUE support were required. Pt. was able to initiate a lateral grasp on the Saebo rings with the left hand with support, and stabilization of each Saebo ring.  Pt. was able to accurately reach up to place the rings onto the horizontal rungs with support proximally. Pt. worked on supporting, and stabilizing objects in midline. Pt. Continues to work on improving bilateral upper extremity functioning in midline in preparation for improving engagement in ADL, and IADL tasks.    Marland Kitchen  PATIENT EDUCATION: Education details: OT services, POC, Goals Person educated: Patient and Parent Education method: Explanation, Demonstration, Tactile cues,  and Verbal cues Education comprehension: verbalized understanding, returned demonstration, verbal cues required, and needs further education   HOME EXERCISE PROGRAM:   Continue ongoing assessment, and continue to provide as needed.     GOALS: Goals reviewed with patient? Yes  SHORT TERM GOALS: Target date: 02/14/2022    To assess splint fit, and make appropriate adjustments to promote good skin integrity through the palmar surface of the bilateral hands.  Baseline: Bilateral resting hand splints. Goal status: INITIAL   LONG TERM GOALS: Target date: 03/14/2022     FOTO score will Improve by 2 points for Pt. perceived improvement with the assessment specific ADL/IADL tasks.  Baseline: Eval: FOTO score: 40,  TR score: 45 Goal status: INITIAL  2.   Pt. will independently perform oral care for 100% of the task after complete set-up. Baseline: Eval: Pt. is able to initiate using an electric toothbrush. Pt. requires assist for set-up, and assist for thoroughness, and as  thePt. fatigues. Goal status: INITIAL  3.  Pt. Will be independent with self-feeding for 100% of the meal after complete set-up Baseline: Eval: Pt. is able to hold standard standard utensils. Pt. Performs as much of the task as he, can and has assistance for the remainder. Goal status: INITIAL  4.  Pt. Will improve grasp patterns and consistently grasp 1/2" objects for ADL, and IADL tasks.  Baseline: Eval: Pt. is able to grasp 1" objects intermittently using a lateral grasp pattern. Goal status: INITIAL  5.  Pt. Will legibly write one, out of 3 letters of his name legibly. Baseline: Eval: Pt is able to hold a thin marker with his left hand, and formulate a a line, and initiate a circular pattern (Pt. without glasses today) Goal status: INITIAL  6. Pt. Will reach up to comb/brush his hair  with minA.     Baseline: Pt. is able to initiate reaching up for hair care with a long handled brush, however is unable to  sustain UE's in elevation to perform the task.     Goal status: INITIAL      ASSESSMENT:  CLINICAL IMPRESSION:  Pt. reports that he fed himself a bag of chips, and that he also used utensils some to feed himself over the weekend. Pt. reports visual limitations today secondary to not feeling well. Pt. was able to accurately  grasp 5 dark resistive cubes from the vertical resistive board. With visual cues, and proximal LUE support were required. Pt. was able to initiate a lateral grasp on the Saebo rings with the left hand with support, and stabilization of each Saebo ring.  Pt. was able to accurately reach up to place the rings onto the horizontal rungs with support proximally. Pt. worked on supporting, and stabilizing objects in midline. Pt. Continues to work on improving bilateral upper extremity functioning in midline in preparation for improving engagement in ADL, and IADL tasks.  PERFORMANCE DEFICITS in functional skills including ADLs, IADLs, coordination, dexterity, proprioception, ROM, strength, pain, [place], GMC, decreased knowledge of use of DME, and UE functional use, cognitive skills including safety awareness, and psychosocial skills including coping strategies, environmental adaptation, habits, and routines and behaviors.   IMPAIRMENTS are limiting patient from ADLs, IADLs, leisure, and social participation.   COMORBIDITIES may have co-morbidities  that affects occupational performance. Patient will benefit from skilled OT to address above impairments and improve overall function.  MODIFICATION OR ASSISTANCE TO COMPLETE EVALUATION: Maximum or significant modification of tasks or assist is necessary to complete an evaluation.  OT OCCUPATIONAL PROFILE AND HISTORY: Comprehensive assessment: Review of records and extensive additional review of physical, cognitive, psychosocial history related to current functional performance.  CLINICAL DECISION MAKING: High - multiple treatment options,  significant modification of task necessary  REHAB POTENTIAL: Fair    EVALUATION COMPLEXITY: High    PLAN: OT FREQUENCY: 1x/week  OT DURATION:8 weeks  PLANNED INTERVENTIONS: self care/ADL training, therapeutic exercise, therapeutic activity, neuromuscular re-education, manual therapy, passive range of motion, patient/family education, and cognitive remediation/compensation  RECOMMENDED OTHER SERVICES: PT  CONSULTED AND AGREED WITH PLAN OF CARE: Patient and family member/caregiver  PLAN FOR NEXT SESSION: Initiate treatment   Harrel Carina, MS, OTR/L   Harrel Carina, OT 02/14/2022, 5:35 PM

## 2022-02-14 NOTE — Therapy (Signed)
OUTPATIENT PHYSICAL THERAPY TREATMENT NOTE Patient Name: Paul Hall MRN: [id number] DOB:September 08, 1995, 26 y.o., male Today's Date: 02/15/2022  PCP:  Paul Hall PROVIDER:  Collene Mares, PA-C   PT End of Session - 02/14/22 9562     Visit Number 72    Number of Visits 81    Date for PT Re-Evaluation 03/07/22    Authorization Type [employer] and [employer]- Need auth past 12th visit    Authorization Time Period 01/04/21-03/29/21; Recert 13/11/6576-4/69/6295; Recert 2/84/1324- 4/0/1027; Recert 2/53/6644-06/06/7423    Progress Note Due on Visit 48    PT Start Time 1601    PT Stop Time 1640    PT Time Calculation (min) 39 min    Equipment Utilized During Treatment Gait belt   Hoyer lift   Activity Tolerance Patient tolerated treatment well   queasiness/nausea   Behavior During Therapy WFL for tasks assessed/performed                  Past Medical History:  Diagnosis Date   Diabetes mellitus ([place])    Hypertension    Stroke [facility])    Past Surgical History:  Procedure Laterality Date   IVC FILTER PLACEMENT ([place] HX)     LEG SURGERY     PEG TUBE PLACEMENT     TRACHEOSTOMY     Patient Active Problem List   Diagnosis Date Noted   Sepsis due to vancomycin resistant Enterococcus species ([place]) 06/06/2019   SIRS (systemic inflammatory response syndrome) ([place]) 06/05/2019   Acute lower UTI 06/05/2019   VRE (vancomycin-resistant Enterococci) infection 06/05/2019   Anemia 06/05/2019   Skin ulcer of sacrum with necrosis of muscle ([place])    Urinary retention    Type 2 diabetes mellitus without complication, with long-term current use of insulin ([facility])    Tachycardia    Lower extremity edema    Acute metabolic encephalopathy    Obstructive sleep apnea    Morbid obesity with BMI of 60.0-69.9, adult ([place])    Goals of care, counseling/discussion    Palliative care encounter    Sepsis ([place]) 04/27/2019   H/O insulin dependent diabetes mellitus 04/27/2019   History of CVA  with residual deficit 04/27/2019   Seizure disorder ([place]) 04/27/2019   Decubitus ulcer of sacral region, stage 4 ([place]) 04/27/2019    REFERRING DIAG: Cerebral infarction, unspecified   THERAPY DIAG:  Muscle weakness (generalized)  Other lack of coordination  History of CVA with residual deficit  Difficulty in walking, not elsewhere classified  Unsteadiness on feet  Abnormality of gait and mobility  Rationale for Evaluation and Treatment Rehabilitation  PERTINENT HISTORY: Paul Hall is a 25yoM who presents with severe weakness, quadriparesis, altered sensorium, and visual impairment s/p critical illness and prolonged hospitalization. Pt hospitalized in October 2020 with ARDS 2/2 COVID19 infection. Pt sustained a complex and lengthy hospitalization which included tracheostomy, prolonged sedation, ECMO. In this period pt sustained CVA and SDH. Pt has now been liberated from tracheostomy and G-tube. Pt has since been hospitalized for wound infection and UTI. Pt lives with parents at home, has hospital bed and left chair, hoyer lift transfers, and power WC for mobility needs. Pt needs heavy physical assistance with ADL 2/2 BUE contractures and motor dysfunction   PRECAUTIONS: Fall  SUBJECTIVE: Patient reports not feeling well at all today. States he will do his best- complaining of ongoing stomach issues and declined to try to transfer or stand today.    Patient reports PAIN:  Are you having pain? No  TODAY'S TREATMENT:   Therex:   Seated hip march with 3lb on left LE and no weight on right- 15 reps x 2 sets  Seated knee ext with 3lb on left LE and no weight on right- 15 reps x 2 sets  Seated hip add with 5 sec hold  2 sets x 15 reps  Seated hip abd with manual resistance. X 15 reps each LE  Seated ham curl with manual resistance x 3 sec hold x 15 reps each LE.   AAROM Left ankle DF/PF x 15 reps  PROM to B LE- hip rotators, flex/ext, hamstrings/gastoc/achilles     Education provided throughout session via VC/TC and demonstration to facilitate movement at target joints and correct muscle activation for all testing and exercises performed.       PATIENT EDUCATION: Education details: posture cues with sitting Person educated: Patient Education method: Explanation, Demonstration, Tactile cues, and Verbal cues Education comprehension: verbalized understanding, returned demonstration, verbal cues required, tactile cues required, and needs further education   HOME EXERCISE PROGRAM:    PT Short Term Goals -       PT SHORT TERM GOAL #1   Title Pt will be independent with HEP in order to improve strength and balance in order to decrease fall risk and improve function at home and work.    Baseline 01/04/2021= No formal HEP in place; 12/12 no HEP in place; 05/10/2021-Patient and his father were able to report compliance with curent HEP consisting of mostly seated/reclined LE strengthening. Both verbalize no questions at this time.    Time 6    Period Weeks    Status Achieved    Target Date 02/15/21              PT Long Term Goals -      PT LONG TERM GOAL #1   Title Patient will increase BLE gross strength by 1/2 muscle grade to improve functional strength for improved independence with potential gait, increased standing tolerance and increased ADL ability.    Baseline 01/04/2021- Patient presents with 1/5 to 3-/5 B LE strength with MMT; 12/12: goal partially met for Left knee/hip; 05/10/2021= 2-/5 bilateal Hip flex; 3+/5 bilateral Knee ext; 06/21/2021= Patient presents with 2-/5 bilateral Hip flex; 3+/5 bilateral knee ext/flex; 2-/5 left ankle DF; 0/5 right ankle- and able to increase reps and resistance with LE's. 09/15/2021- Patient technically presents with 2-/5 B hip flex/abd/add - but he is able to raise his hip up to approx 100 deg which has improved. 3+/5 Bilateral knee ext, 2-/5 left ankle and 0/5 right ankle. 12/08/2021= Patient able to lift left  knee at 110 deg of hip flex; presents with 3+/5 knee ext, 2-/5 left ankle DF and 0/5 right ankle DF, 2-/5 bilateral Hip abd in seated position.    Time 12    Period Weeks    Status On-going    Target Date 03/07/2022     PT LONG TERM GOAL #2   Title Patient will tolerate sitting unsupported demonstrating erect sitting posture for 15 minutes with CGA to demonstrate improved back extensor strength and improved sitting tolerance.    Baseline 01/04/2021- Patient confied to sitting in lift chair or electric power chair with back support and unable to sit upright without physical assistance; 12/12: tolerates <1 minutes upright unsupported sitting. 05/10/2021=static sit with forward trunk lean  in his power wheelchair without back support x approx 3 min. 06/21/2021=Unable to assess today due to patient with acute back pain but on  previous visit able to sit x 8 min without back support. 09/15/2021- on last visit- 09/13/2021- patient was able to sit unsupported x 8 min at edge of mat. 10/13/2023 - Patient was able to sit at edge of mat with varying level of assist today from SBA to min A for a total of 20 min. 12/13/2021= Patient demonstrated unsupported sitting at edge of mat for approx 20 min   Time 12    Period Weeks    Status GOAL MET   Target Date 12/08/21      PT LONG TERM GOAL #3   Title Patient will demonstrate ability to perform static standing in // bars > 2 min with Max Assist  without loss of balance and fair posture for improved overall strength for pre-gait and transfer activities.    Baseline 01/04/2021= Patient current uanble to stand- Dependent on hoyer or sit to stand lift for transfers. 05/10/2021=Not appropriate yet- Currently still dependent with all transfers using hoyer. 06/21/2021= Patient continuing now to focus on LE strengthening to prepare for standing-unable to try today due to acute low back pain-  planning on attempting in new cert period. 09/15/2021- Patient has attempted standing 2x in past  two week- max Assist of 2 people - only once was he successful to clearing his bottom from chair - Will continue to be a focus during the new certification. 12/13/2021= Patient has been limited secondary to increased overall low back pain during this certification and will require more time to focus on this goal.    Time 12    Period Weeks    Status On-going    Target Date 03/07/2022     PT LONG TERM GOAL #4   Title Pt will improve FOTO score by 10 points or more demonstrating improved perceived functional ability    Baseline FOTO 7 on 10/17; 03/15/21: FOTO 12; 05/10/2021 06/21/2021= 1; 09/15/2021= 9; 12/13/2021= Will issue next visit    Time 12    Period Weeks    Status On-going    Target Date 03/07/2022     PT LONG TERM GOAL #5   Title Patient will perform sit to stand transfer with appropriate AD and max assist of 2 people with 75% consistency to prepare for pregait activities.    Baseline 09/15/2021= Patient unable to stand well- unable to clear his bottom off chair with Max assist of 2 persons. 12/13/2021- Goal not appropriate to try yet but will keep and roll over to next cert as shift continues to focus on transfers/standing   Time 12    Period Weeks    Status New    Target Date 03/07/2022   PT LONG TERM GOAL #6  Title Patient will tolerate sitting unsupported demonstrating erect sitting posture for 30 minutes with CGA to demonstrate improved back extensor strength and improved sitting tolerance.   Baseline 12/13/2021= Patient demonstrated unsupported sitting at edge of mat for approx 20 min;  12/29/2021- Patient performed approx 30 min of dynamic sitting activities today.   Time 12   Period Weeks   Status ONGOING  Target Date 03/07/2022            Plan     Clinical Impression Statement Patient was well motivated for today's session- despite not feeling well. He was able to respond to some AAROM, AROM, and even some resistive LE strengthening without report of any increased stomach  upset. He continues to fatigue yet able to participate and complete up to 15 reps today.  Patient will benefit from additional skilled PT intervention to improve strength, balance, and mobility for improved quality of life.    Personal Factors and Comorbidities Comorbidity 3+;Time since onset of injury/illness/exacerbation    Comorbidities CVA, diabetes, Seizures    Examination-Activity Limitations Bathing;Bed Mobility;Bend;Caring for Others;Carry;Dressing;Hygiene/Grooming;Lift;Locomotion Level;Reach Overhead;Self Feeding;Sit;Squat;Stairs;Stand;Transfers;Toileting    Examination-Participation Restrictions Cleaning;Community Activity;Driving;Laundry;Medication Management;Meal Prep;Occupation;Personal Finances;Shop;Yard Work;Volunteer    Stability/Clinical Decision Making Evolving/Moderate complexity    Rehab Potential Fair    PT Frequency 2x / week    PT Duration 12 weeks    PT Treatment/Interventions ADLs/Self Care Home Management;Cryotherapy;Electrical Stimulation;Moist Heat;Ultrasound;DME Instruction;Gait training;Stair training;Functional mobility training;Therapeutic exercise;Balance training;Patient/family education;Orthotic Fit/Training;Neuromuscular re-education;Wheelchair mobility training;Manual techniques;Passive range of motion;Dry needling;Energy conservation;Taping;Visual/perceptual remediation/compensation;Joint Manipulations    PT Next Visit Plan core strength- sitting at EOM, postural control, sabina sit to stand if able; Continue with progressive LE Strengthening    PT Home Exercise Plan No changes to HEP today    Consulted and Agree with Plan of Care Patient;Family member/caregiver    Family Member Consulted Dad               Lewis Moccasin, PT 02/15/2022, 4:19 PM

## 2022-02-16 ENCOUNTER — Ambulatory Visit: Payer: BC Managed Care – PPO

## 2022-02-16 DIAGNOSIS — Z9229 Personal history of other drug therapy: Secondary | ICD-10-CM | POA: Diagnosis not present

## 2022-02-16 DIAGNOSIS — Z993 Dependence on wheelchair: Secondary | ICD-10-CM | POA: Diagnosis not present

## 2022-02-16 DIAGNOSIS — B001 Herpesviral vesicular dermatitis: Secondary | ICD-10-CM | POA: Diagnosis not present

## 2022-02-16 DIAGNOSIS — G4733 Obstructive sleep apnea (adult) (pediatric): Secondary | ICD-10-CM | POA: Diagnosis not present

## 2022-02-16 DIAGNOSIS — Z0001 Encounter for general adult medical examination with abnormal findings: Secondary | ICD-10-CM | POA: Diagnosis not present

## 2022-02-16 DIAGNOSIS — Z1331 Encounter for screening for depression: Secondary | ICD-10-CM | POA: Diagnosis not present

## 2022-02-16 DIAGNOSIS — I10 Essential (primary) hypertension: Secondary | ICD-10-CM | POA: Diagnosis not present

## 2022-02-16 DIAGNOSIS — I6389 Other cerebral infarction: Secondary | ICD-10-CM | POA: Diagnosis not present

## 2022-02-16 DIAGNOSIS — E1165 Type 2 diabetes mellitus with hyperglycemia: Secondary | ICD-10-CM | POA: Diagnosis not present

## 2022-02-17 ENCOUNTER — Ambulatory Visit: Payer: BC Managed Care – PPO | Admitting: Dermatology

## 2022-02-20 DIAGNOSIS — G4733 Obstructive sleep apnea (adult) (pediatric): Secondary | ICD-10-CM | POA: Diagnosis not present

## 2022-02-20 NOTE — Therapy (Signed)
OUTPATIENT PHYSICAL THERAPY TREATMENT NOTE Patient Name: Paul Hall MRN: [id number] DOB:[birth year], 26 y.o., male Today's Date: 02/22/2022  PCP:  Cindi Carbon PROVIDER:  Lenise Herald, PA-C   PT End of Session - 02/21/22 0645     Visit Number 73    Number of Visits 81    Date for PT Re-Evaluation 03/07/22    Authorization Type [employer] and [employer]- Need auth past 12th visit    Authorization Time Period 01/04/21-03/29/21; Recert 03/24/2021-06/16/2021; Recert 09/15/2021- 12/08/2021; Recert 12/13/2021-03/07/2022    Progress Note Due on Visit 80    PT Start Time 1601    PT Stop Time 1644    PT Time Calculation (min) 43 min    Equipment Utilized During Treatment Gait belt   Hoyer lift   Activity Tolerance Patient tolerated treatment well   queasiness/nausea   Behavior During Therapy WFL for tasks assessed/performed                  Past Medical History:  Diagnosis Date   Diabetes mellitus (HCC)    Hypertension    Stroke [facility])    Past Surgical History:  Procedure Laterality Date   IVC FILTER PLACEMENT ([facility] HX)     LEG SURGERY     PEG TUBE PLACEMENT     TRACHEOSTOMY     Patient Active Problem List   Diagnosis Date Noted   Sepsis due to vancomycin resistant Enterococcus species (HCC) 06/06/2019   SIRS (systemic inflammatory response syndrome) (HCC) 06/05/2019   Acute lower UTI 06/05/2019   VRE (vancomycin-resistant Enterococci) infection 06/05/2019   Anemia 06/05/2019   Skin ulcer of sacrum with necrosis of muscle (HCC)    Urinary retention    Type 2 diabetes mellitus without complication, with long-term current use of insulin (HCC)    Tachycardia    Lower extremity edema    Acute metabolic encephalopathy    Obstructive sleep apnea    Morbid obesity with BMI of 60.0-69.9, adult (HCC)    Goals of care, counseling/discussion    Palliative care encounter    Sepsis (HCC) 04/27/2019   H/O insulin dependent diabetes mellitus 04/27/2019   History of CVA  with residual deficit 04/27/2019   Seizure disorder (HCC) 04/27/2019   Decubitus ulcer of sacral region, stage 4 (HCC) 04/27/2019    REFERRING DIAG: Cerebral infarction, unspecified   THERAPY DIAG:  Muscle weakness (generalized)  Other lack of coordination  History of CVA with residual deficit  Difficulty in walking, not elsewhere classified  Unsteadiness on feet  Abnormality of gait and mobility  Other abnormalities of gait and mobility  Rationale for Evaluation and Treatment Rehabilitation  PERTINENT HISTORY: [name] is a 25yoM who presents with severe weakness, quadriparesis, altered sensorium, and visual impairment s/p critical illness and prolonged hospitalization. Pt hospitalized in October 2020 with ARDS 2/2 COVID19 infection. Pt sustained a complex and lengthy hospitalization which included tracheostomy, prolonged sedation, ECMO. In this period pt sustained CVA and SDH. Pt has now been liberated from tracheostomy and G-tube. Pt has since been hospitalized for wound infection and UTI. Pt lives with parents at home, has hospital bed and left chair, hoyer lift transfers, and power WC for mobility needs. Pt needs heavy physical assistance with ADL 2/2 BUE contractures and motor dysfunction   PRECAUTIONS: Fall  SUBJECTIVE: Patient reports he had a good weekend and feeling pretty good today.   Patient reports PAIN:  Are you having pain? No   TODAY'S TREATMENT:   Therapeutic Activity:  Transfer- Product/process development scientist (using new ARJO)  from w/c to mat table (seated position) +2 assist.  Once positioned in seated position at end mat- with mat positioned- Patient performed static sitting without back support performing the following: Several min of static sit (CGA) Dynamic lateral weight shifting with CGA to Min A Dynamic ant/post weight shift with Min A (Encouragement to lean more forward- nose over toes) Dynamic reaching with BUE support using his limited vision but reaching for  PT outstretched hand at various angles/heights to challenge his hand/eye coordination and sitting balance.  Moving his feet ant/position to practice adjust feet for sit to stand transfer Knee ext x 20 reps each Leg (AROM) - much less on right yet still able to clear foot from door    Standing- with sit to stand lift  x 1 attempt today with PT on one side and patient father on opp side. Patient was positioned into standing slowly- his arms were supported on both sides. Patient able to stand approx 1 min but requested to stop secondary to Legs very tired.      Education provided throughout session via VC/TC and demonstration to facilitate movement at target joints and correct muscle activation for all testing and exercises performed.       PATIENT EDUCATION: Education details: posture cues with sitting Person educated: Patient Education method: Explanation, Demonstration, Tactile cues, and Verbal cues Education comprehension: verbalized understanding, returned demonstration, verbal cues required, tactile cues required, and needs further education   HOME EXERCISE PROGRAM:    PT Short Term Goals -       PT SHORT TERM GOAL #1   Title Pt will be independent with HEP in order to improve strength and balance in order to decrease fall risk and improve function at home and work.    Baseline 01/04/2021= No formal HEP in place; 12/12 no HEP in place; 05/10/2021-Patient and his father were able to report compliance with curent HEP consisting of mostly seated/reclined LE strengthening. Both verbalize no questions at this time.    Time 6    Period Weeks    Status Achieved    Target Date 02/15/21              PT Long Term Goals -      PT LONG TERM GOAL #1   Title Patient will increase BLE gross strength by 1/2 muscle grade to improve functional strength for improved independence with potential gait, increased standing tolerance and increased ADL ability.    Baseline 01/04/2021- Patient  presents with 1/5 to 3-/5 B LE strength with MMT; 12/12: goal partially met for Left knee/hip; 05/10/2021= 2-/5 bilateal Hip flex; 3+/5 bilateral Knee ext; 06/21/2021= Patient presents with 2-/5 bilateral Hip flex; 3+/5 bilateral knee ext/flex; 2-/5 left ankle DF; 0/5 right ankle- and able to increase reps and resistance with LE's. 09/15/2021- Patient technically presents with 2-/5 B hip flex/abd/add - but he is able to raise his hip up to approx 100 deg which has improved. 3+/5 Bilateral knee ext, 2-/5 left ankle and 0/5 right ankle. 12/08/2021= Patient able to lift left knee at 110 deg of hip flex; presents with 3+/5 knee ext, 2-/5 left ankle DF and 0/5 right ankle DF, 2-/5 bilateral Hip abd in seated position.    Time 12    Period Weeks    Status On-going    Target Date 03/07/2022     PT LONG TERM GOAL #2   Title Patient will tolerate sitting unsupported demonstrating erect sitting posture  for 15 minutes with CGA to demonstrate improved back extensor strength and improved sitting tolerance.    Baseline 01/04/2021- Patient confied to sitting in lift chair or electric power chair with back support and unable to sit upright without physical assistance; 12/12: tolerates <1 minutes upright unsupported sitting. 05/10/2021=static sit with forward trunk lean  in his power wheelchair without back support x approx 3 min. 06/21/2021=Unable to assess today due to patient with acute back pain but on previous visit able to sit x 8 min without back support. 09/15/2021- on last visit- 09/13/2021- patient was able to sit unsupported x 8 min at edge of mat. 10/13/2023 - Patient was able to sit at edge of mat with varying level of assist today from SBA to min A for a total of 20 min. 12/13/2021= Patient demonstrated unsupported sitting at edge of mat for approx 20 min   Time 12    Period Weeks    Status GOAL MET   Target Date 12/08/21      PT LONG TERM GOAL #3   Title Patient will demonstrate ability to perform static standing in  // bars > 2 min with Max Assist  without loss of balance and fair posture for improved overall strength for pre-gait and transfer activities.    Baseline 01/04/2021= Patient current uanble to stand- Dependent on hoyer or sit to stand lift for transfers. 05/10/2021=Not appropriate yet- Currently still dependent with all transfers using hoyer. 06/21/2021= Patient continuing now to focus on LE strengthening to prepare for standing-unable to try today due to acute low back pain-  planning on attempting in new cert period. 09/15/2021- Patient has attempted standing 2x in past two week- max Assist of 2 people - only once was he successful to clearing his bottom from chair - Will continue to be a focus during the new certification. 12/13/2021= Patient has been limited secondary to increased overall low back pain during this certification and will require more time to focus on this goal.    Time 12    Period Weeks    Status On-going    Target Date 03/07/2022     PT LONG TERM GOAL #4   Title Pt will improve FOTO score by 10 points or more demonstrating improved perceived functional ability    Baseline FOTO 7 on 10/17; 03/15/21: FOTO 12; 05/10/2021 06/21/2021= 1; 09/15/2021= 9; 12/13/2021= Will issue next visit    Time 12    Period Weeks    Status On-going    Target Date 03/07/2022     PT LONG TERM GOAL #5   Title Patient will perform sit to stand transfer with appropriate AD and max assist of 2 people with 75% consistency to prepare for pregait activities.    Baseline 09/15/2021= Patient unable to stand well- unable to clear his bottom off chair with Max assist of 2 persons. 12/13/2021- Goal not appropriate to try yet but will keep and roll over to next cert as shift continues to focus on transfers/standing   Time 12    Period Weeks    Status New    Target Date 03/07/2022   PT LONG TERM GOAL #6  Title Patient will tolerate sitting unsupported demonstrating erect sitting posture for 30 minutes with CGA to demonstrate  improved back extensor strength and improved sitting tolerance.   Baseline 12/13/2021= Patient demonstrated unsupported sitting at edge of mat for approx 20 min;  12/29/2021- Patient performed approx 30 min of dynamic sitting activities today.   Time 12  Period Weeks   Status ONGOING  Target Date 03/07/2022            Plan     Clinical Impression Statement Patient was well motivated for today's session- agreeable to transfer and later standing. Treatment focused more on static/dynamic sitting without back support. Patient reported increased overall fatigue from legs and back and later attempted standing yet fatigued from over 25 min of sitting today. He was more confident with lateral weight shift yet requires some encouragement to lean forward and will benefit from further training.  Patient will benefit from additional skilled PT intervention to improve strength, balance, and mobility for improved quality of life.    Personal Factors and Comorbidities Comorbidity 3+;Time since onset of injury/illness/exacerbation    Comorbidities CVA, diabetes, Seizures    Examination-Activity Limitations Bathing;Bed Mobility;Bend;Caring for Others;Carry;Dressing;Hygiene/Grooming;Lift;Locomotion Level;Reach Overhead;Self Feeding;Sit;Squat;Stairs;Stand;Transfers;Toileting    Examination-Participation Restrictions Cleaning;Community Activity;Driving;Laundry;Medication Management;Meal Prep;Occupation;Personal Finances;Shop;Yard Work;Volunteer    Stability/Clinical Decision Making Evolving/Moderate complexity    Rehab Potential Fair    PT Frequency 2x / week    PT Duration 12 weeks    PT Treatment/Interventions ADLs/Self Care Home Management;Cryotherapy;Electrical Stimulation;Moist Heat;Ultrasound;DME Instruction;Gait training;Stair training;Functional mobility training;Therapeutic exercise;Balance training;Patient/family education;Orthotic Fit/Training;Neuromuscular re-education;Wheelchair mobility  training;Manual techniques;Passive range of motion;Dry needling;Energy conservation;Taping;Visual/perceptual remediation/compensation;Joint Manipulations    PT Next Visit Plan core strength- sitting at EOM, postural control, sabina sit to stand if able; Continue with progressive LE Strengthening    PT Home Exercise Plan No changes to HEP today    Consulted and Agree with Plan of Care Patient;Family member/caregiver    Family Member Consulted Dad               Lenda Kelp, PT 02/22/2022, 6:56 AM

## 2022-02-21 ENCOUNTER — Ambulatory Visit: Payer: BC Managed Care – PPO

## 2022-02-21 ENCOUNTER — Ambulatory Visit: Payer: BC Managed Care – PPO | Admitting: Occupational Therapy

## 2022-02-21 ENCOUNTER — Encounter: Payer: BC Managed Care – PPO | Admitting: Occupational Therapy

## 2022-02-21 DIAGNOSIS — L8994 Pressure ulcer of unspecified site, stage 4: Secondary | ICD-10-CM | POA: Diagnosis not present

## 2022-02-21 DIAGNOSIS — R278 Other lack of coordination: Secondary | ICD-10-CM

## 2022-02-21 DIAGNOSIS — I6930 Unspecified sequelae of cerebral infarction: Secondary | ICD-10-CM

## 2022-02-21 DIAGNOSIS — R32 Unspecified urinary incontinence: Secondary | ICD-10-CM | POA: Diagnosis not present

## 2022-02-21 DIAGNOSIS — R262 Difficulty in walking, not elsewhere classified: Secondary | ICD-10-CM | POA: Diagnosis not present

## 2022-02-21 DIAGNOSIS — R2689 Other abnormalities of gait and mobility: Secondary | ICD-10-CM | POA: Diagnosis not present

## 2022-02-21 DIAGNOSIS — R269 Unspecified abnormalities of gait and mobility: Secondary | ICD-10-CM

## 2022-02-21 DIAGNOSIS — R2681 Unsteadiness on feet: Secondary | ICD-10-CM

## 2022-02-21 DIAGNOSIS — M6281 Muscle weakness (generalized): Secondary | ICD-10-CM | POA: Diagnosis not present

## 2022-02-21 DIAGNOSIS — H543 Unqualified visual loss, both eyes: Secondary | ICD-10-CM | POA: Diagnosis not present

## 2022-02-21 DIAGNOSIS — I6389 Other cerebral infarction: Secondary | ICD-10-CM | POA: Diagnosis not present

## 2022-02-21 NOTE — Therapy (Signed)
OUTPATIENT OCCUPATIONAL THERAPY TREATMENT  Patient Name: Paul BolderSamuel Hakeem Behan MRN: [id number] DOB:17-Nov-1995, 26 y.o., male Today's Date: 02/14/2022  PCP: Dr. Sherwood GamblerFusco REFERRING PROVIDER: Dr. Sherwood GamblerFusco   OT End of Session - 02/21/22 1824     Visit Number 6    Number of Visits 12    Date for OT Re-Evaluation 04/11/22    Authorization Type Progress report period starting 10/06/2021    OT Start Time 1645    OT Stop Time 1730    OT Time Calculation (min) 45 min    Activity Tolerance Patient tolerated treatment well    Behavior During Therapy WFL for tasks assessed/performed                 Past Medical History:  Diagnosis Date   Diabetes mellitus (HCC)    Hypertension    Stroke [facility](HCC)    Past Surgical History:  Procedure Laterality Date   IVC FILTER PLACEMENT ([facility] HX)     LEG SURGERY     PEG TUBE PLACEMENT     TRACHEOSTOMY     Patient Active Problem List   Diagnosis Date Noted   Sepsis due to vancomycin resistant Enterococcus species (HCC) 06/06/2019   SIRS (systemic inflammatory response syndrome) (HCC) 06/05/2019   Acute lower UTI 06/05/2019   VRE (vancomycin-resistant Enterococci) infection 06/05/2019   Anemia 06/05/2019   Skin ulcer of sacrum with necrosis of muscle (HCC)    Urinary retention    Type 2 diabetes mellitus without complication, with long-term current use of insulin (HCC)    Tachycardia    Lower extremity edema    Acute metabolic encephalopathy    Obstructive sleep apnea    Morbid obesity with BMI of 60.0-69.9, adult (HCC)    Goals of care, counseling/discussion    Palliative care encounter    Sepsis (HCC) 04/27/2019   H/O insulin dependent diabetes mellitus 04/27/2019   History of CVA with residual deficit 04/27/2019   Seizure disorder (HCC) 04/27/2019   Decubitus ulcer of sacral region, stage 4 (HCC) 04/27/2019    ONSET DATE: 01/2019  REFERRING DIAG: CVA/COVID-19  THERAPY DIAG:  Muscle weakness (generalized)  Low vision, both  eyes  Other lack of coordination  Rationale for Evaluation and Treatment Rehabilitation  SUBJECTIVE:   Patient reports that he fed himself more of his lunch this afternoon.   SUBJECTIVE STATEMENT:  Pt. reports feeling okay today  Pt accompanied by: self and family member  PERTINENT HISTORY:  Pt. is a 26 y.o. male who was diagnosed with COVID-19, and CVA with resultant quadriplegia. Pt. Was then hospitalized with VRE UTI. PMHx includes: urinary retention, seizure disorder, obstructive sleep disorder, DM Type II, Morbid obesity.   PRECAUTIONS: None  WEIGHT BEARING RESTRICTIONS No  PAIN:  Are you having pain? No  FALLS: Has patient fallen in last 6 months? No  LIVING ENVIRONMENT: Lives with: lives with their family Lives in: House/apartment Stairs: No Level Entry Has following equipment at home: Wheelchair (power) and hoyer lift, sit to stand lift  PLOF: Independent  PATIENT GOALS: To be able to engage in more daily care tasks.  OBJECTIVE:   HAND DOMINANCE: Right  ADLs:  Transfers/ambulation related to ADLs: Eating: Pt. Reports being able to hold standard utensils, and is starting to engage more in self-feeding tasks, hand to mouth patterns. Pt. Reports that he does as much as he can with the task, and family assists  with the remainder of the task. Grooming: Pt. Is able to initiate holding an electric  toothbrush, and brush his teeth. Family assists LB Dressing: Total Assist UE dressing: Pt. is now able to reach up to actively assist with grasping , and pulling his gown down. Toileting:  Total Assist Bathing: MaxA UB, Total assist LB Tub Shower transfers: N/A Equipment: See above    IADLs: Shopping: Relies on family to assist Light housekeeping: Total Assist Meal Prep: Total Assist Community mobility:   Medication management:  Total Assist  Financial management: N/A Handwriting: Not legible: Pt. Is able to hold a pen with the left hand, and initiate marking  the page. Pt.'s eye glasses were not available  MOBILITY STATUS:  Power w/c  POSTURE COMMENTS:  Pt. Requires position changes in his power w/c  ACTIVITY TOLERANCE: Activity tolerance:  Fair  FUNCTIONAL OUTCOME MEASURES: FOTO: 40  TR score: 45  UPPER EXTREMITY ROM     Active ROM Right eval Left eval  Shoulder flexion 106 scaption 119  Shoulder abduction 114 110  Shoulder adduction    Shoulder extension    Shoulder internal rotation    Shoulder external rotation    Elbow flexion 120(130) 135(135)  Elbow extension -45(-35) -27(-18)  Wrist flexion    Wrist extension -30(10) 10(50  Wrist ulnar deviation    Wrist radial deviation    Wrist pronation    Wrist supination    (Blank rows = not tested)   UPPER EXTREMITY MMT:     Right eval Left eval  Shoulder flexion 3-/5 scaption 3-/5  Shoulder abduction 3-/5 3-/5  Shoulder adduction    Shoulder extension    Shoulder internal rotation    Shoulder external rotation    Elbow flexion 3/5 3/5  Elbow extension 2-/5 2-/5  Wrist flexion    Wrist extension 2-/5 2/5  Wrist ulnar deviation    Wrist radial deviation    Wrist pronation    Wrist supination    (Blank rows = not tested)    HAND FUNCTION:  Grip strength: Right: 0 lbs; Left: 0 lbs and Lateral pinch: Right: 5 lbs, Left: 2 lbs  Bilateral digit PIP/DIP flexion contractures with MP hyperextension with attempts for AROM. Pt. is able to tolerate AROM to the bilateral digits at the initial evaluation however, has a history of pain in the digits.  COORDINATION:  Pt. is unable to grasp 9-hole test pegs. Pt. is able to initiate grasping larger pegs, and is able to hold a pen in the left hand.  SENSATION: Light touch: Impaired   EDEMA:  N/A  MUSCLE TONE: BUE flexor Spasticity  COGNITIO Overall cognitive status: Continue to assess in functional context  VISION:   Subjective report: Pt. Was not wearing glasses at the time of the initial eval.  Baseline vision:  Vision is very limited. Wears glasses all the time Visual history: History of impaired vision following CVA. Pt. Has received treatment through the [facility] low vision rehabilitation program.   VISION ASSESSMENT: Impaired To be further assessed in functional context  PERCEPTION: Impaired   PRAXIS: Impaired: motor planning  OBSERVATIONS:  Pt reports being on  Tramadol   TODAY'S TREATMENT:   Neuromuscular reeducation:  Pt. worked on using his left hand for lateral grasping 1" flat checker discs and placing them into a small dish stabilized the dish with the right hand in midline. Pt. Required hand over hand assist initially to stabilize the dish in midline with the right hand. Pt. worked using the left 2nd digit to remove a full dish of checkers. Pt. grasped them with his  2nd digit sliding it up the side of the dish to his thumb.      Pt. required hand over hand assist to stabilize the dish with his right hand at midline while using his left hand for grasping. Pt. required visual cues, tactile cues, and occasionally required the checkers to be placed upright on its side when grasping them individually off of the tabletop surface.  Pt. was able to remove all of the checker discs from the dish with the exception of 2. Pt. Required the dish to be stabilized on Dycem. Pt. continues to work on improving bilateral upper extremity functioning in midline in preparation for improving engagement in ADL, and IADL tasks.    Marland Kitchen  PATIENT EDUCATION: Education details: OT services, POC, Goals Person educated: Patient and Parent Education method: Explanation, Demonstration, Tactile cues, and Verbal cues Education comprehension: verbalized understanding, returned demonstration, verbal cues required, and needs further education   HOME EXERCISE PROGRAM:   Continue ongoing assessment, and continue to provide as needed.     GOALS: Goals reviewed with patient? Yes  SHORT TERM GOALS: Target date:  02/14/2022    To assess splint fit, and make appropriate adjustments to promote good skin integrity through the palmar surface of the bilateral hands.  Baseline: Bilateral resting hand splints. Goal status: INITIAL   LONG TERM GOALS: Target date: 03/14/2022     FOTO score will Improve by 2 points for Pt. perceived improvement with the assessment specific ADL/IADL tasks.  Baseline: Eval: FOTO score: 40,  TR score: 45 Goal status: INITIAL  2.   Pt. will independently perform oral care for 100% of the task after complete set-up. Baseline: Eval: Pt. is able to initiate using an electric toothbrush. Pt. requires assist for set-up, and assist for thoroughness, and as  thePt. fatigues. Goal status: INITIAL  3.  Pt. Will be independent with self-feeding for 100% of the meal after complete set-up Baseline: Eval: Pt. is able to hold standard standard utensils. Pt. Performs as much of the task as he, can and has assistance for the remainder. Goal status: INITIAL  4.  Pt. Will improve grasp patterns and consistently grasp 1/2" objects for ADL, and IADL tasks.  Baseline: Eval: Pt. is able to grasp 1" objects intermittently using a lateral grasp pattern. Goal status: INITIAL  5.  Pt. Will legibly write one, out of 3 letters of his name legibly. Baseline: Eval: Pt is able to hold a thin marker with his left hand, and formulate a a line, and initiate a circular pattern (Pt. without glasses today) Goal status: INITIAL  6. Pt. Will reach up to comb/brush his hair  with minA.     Baseline: Pt. is able to initiate reaching up for hair care with a long handled brush, however is unable to sustain UE's in elevation to perform the task.     Goal status: INITIAL      ASSESSMENT:  CLINICAL IMPRESSION:  Pt. required hand over hand assist to stabilize the dish with his right hand at midline while using his left hand for grasping. Pt. required visual cues, tactile cues, and occasionally required the  checkers to be placed upright on its side when grasping them individually off of the tabletop surface.  Pt. was able to remove all of the checker discs from the dish with the exception of 2. Pt. Required the dish to be stabilized on Dycem. Pt. continues to work on improving bilateral upper extremity functioning in midline in preparation for improving  engagement in ADL, and IADL tasks.  PERFORMANCE DEFICITS in functional skills including ADLs, IADLs, coordination, dexterity, proprioception, ROM, strength, pain, FMC, GMC, decreased knowledge of use of DME, and UE functional use, cognitive skills including safety awareness, and psychosocial skills including coping strategies, environmental adaptation, habits, and routines and behaviors.   IMPAIRMENTS are limiting patient from ADLs, IADLs, leisure, and social participation.   COMORBIDITIES may have co-morbidities  that affects occupational performance. Patient will benefit from skilled OT to address above impairments and improve overall function.  MODIFICATION OR ASSISTANCE TO COMPLETE EVALUATION: Maximum or significant modification of tasks or assist is necessary to complete an evaluation.  OT OCCUPATIONAL PROFILE AND HISTORY: Comprehensive assessment: Review of records and extensive additional review of physical, cognitive, psychosocial history related to current functional performance.  CLINICAL DECISION MAKING: High - multiple treatment options, significant modification of task necessary  REHAB POTENTIAL: Fair    EVALUATION COMPLEXITY: High    PLAN: OT FREQUENCY: 1x/week  OT DURATION:8 weeks  PLANNED INTERVENTIONS: self care/ADL training, therapeutic exercise, therapeutic activity, neuromuscular re-education, manual therapy, passive range of motion, patient/family education, and cognitive remediation/compensation  RECOMMENDED OTHER SERVICES: PT  CONSULTED AND AGREED WITH PLAN OF CARE: Patient and family member/caregiver  PLAN FOR NEXT  SESSION: Initiate treatment   Olegario Messier, MS, OTR/L   Olegario Messier, OT 02/14/2022, 5:35 PM

## 2022-02-22 DIAGNOSIS — Z8673 Personal history of transient ischemic attack (TIA), and cerebral infarction without residual deficits: Secondary | ICD-10-CM | POA: Diagnosis not present

## 2022-02-22 DIAGNOSIS — G8250 Quadriplegia, unspecified: Secondary | ICD-10-CM | POA: Diagnosis not present

## 2022-02-23 ENCOUNTER — Ambulatory Visit: Payer: BC Managed Care – PPO | Admitting: Physical Therapy

## 2022-02-23 ENCOUNTER — Ambulatory Visit: Payer: BC Managed Care – PPO

## 2022-02-23 DIAGNOSIS — R2681 Unsteadiness on feet: Secondary | ICD-10-CM

## 2022-02-23 DIAGNOSIS — R2689 Other abnormalities of gait and mobility: Secondary | ICD-10-CM

## 2022-02-23 DIAGNOSIS — M6281 Muscle weakness (generalized): Secondary | ICD-10-CM

## 2022-02-23 DIAGNOSIS — R262 Difficulty in walking, not elsewhere classified: Secondary | ICD-10-CM

## 2022-02-23 DIAGNOSIS — R269 Unspecified abnormalities of gait and mobility: Secondary | ICD-10-CM

## 2022-02-23 NOTE — Therapy (Deleted)
OUTPATIENT PHYSICAL THERAPY TREATMENT NOTE Patient Name: Paul Hall MRN: [id number] DOB:1995-05-25, 26 y.o., male Today's Date: 02/23/2022  PCP:  Loistine Chance PROVIDER:  Collene Mares, PA-C          Past Medical History:  Diagnosis Date   Diabetes mellitus ([place])    Hypertension    Stroke [facility])    Past Surgical History:  Procedure Laterality Date   IVC FILTER PLACEMENT ([place] HX)     LEG SURGERY     PEG TUBE PLACEMENT     TRACHEOSTOMY     Patient Active Problem List   Diagnosis Date Noted   Sepsis due to vancomycin resistant Enterococcus species ([place]) 06/06/2019   SIRS (systemic inflammatory response syndrome) ([place]) 06/05/2019   Acute lower UTI 06/05/2019   VRE (vancomycin-resistant Enterococci) infection 06/05/2019   Anemia 06/05/2019   Skin ulcer of sacrum with necrosis of muscle ([place])    Urinary retention    Type 2 diabetes mellitus without complication, with long-term current use of insulin (H[facility])    Tachycardia    Lower extremity edema    Acute metabolic encephalopathy    Obstructive sleep apnea    Morbid obesity with BMI of 60.0-69.9, adult ([place])    Goals of care, counseling/discussion    Palliative care encounter    Sepsis ([place]) 04/27/2019   H/O insulin dependent diabetes mellitus 04/27/2019   History of CVA with residual deficit 04/27/2019   Seizure disorder ([place]) 04/27/2019   Decubitus ulcer of sacral region, stage 4 ([place]) 04/27/2019    REFERRING DIAG: Cerebral infarction, unspecified   THERAPY DIAG:  Muscle weakness (generalized)  Difficulty in walking, not elsewhere classified  Abnormality of gait and mobility  Unsteadiness on feet  Other abnormalities of gait and mobility  Rationale for Evaluation and Treatment Rehabilitation  PERTINENT HISTORY: [name] is a 25yoM who presents with severe weakness, quadriparesis, altered sensorium, and visual impairment s/p critical illness and prolonged hospitalization. Pt hospitalized in  October 2020 with ARDS 2/2 COVID19 infection. Pt sustained a complex and lengthy hospitalization which included tracheostomy, prolonged sedation, ECMO. In this period pt sustained CVA and SDH. Pt has now been liberated from tracheostomy and G-tube. Pt has since been hospitalized for wound infection and UTI. Pt lives with parents at home, has hospital bed and left chair, hoyer lift transfers, and power WC for mobility needs. Pt needs heavy physical assistance with ADL 2/2 BUE contractures and motor dysfunction   PRECAUTIONS: Fall  SUBJECTIVE: Patient reports he had a good weekend and feeling pretty good today.   Patient reports PAIN:  Are you having pain? No   TODAY'S TREATMENT:   Therapeutic Activity:    Transfer- Hoyer (using new ARJO)  from w/c to mat table (seated position) +2 assist.  Once positioned in seated position at end mat- with mat positioned- Patient performed static sitting without back support performing the following: Several min of static sit (CGA) Dynamic lateral weight shifting with CGA to Min A Dynamic ant/post weight shift with Min A (Encouragement to lean more forward- nose over toes) Dynamic reaching with BUE support using his limited vision but reaching for PT outstretched hand at various angles/heights to challenge his hand/eye coordination and sitting balance.  Moving his feet ant/position to practice adjust feet for sit to stand transfer Knee ext x 20 reps each Leg (AROM) - much less on right yet still able to clear foot from door    Standing- with sit to stand lift  x 1 attempt today with  PT on one side and patient father on opp side. Patient was positioned into standing slowly- his arms were supported on both sides. Patient able to stand approx 1 min but requested to stop secondary to Legs very tired.      Education provided throughout session via VC/TC and demonstration to facilitate movement at target joints and correct muscle activation for all testing and  exercises performed.       PATIENT EDUCATION: Education details: posture cues with sitting Person educated: Patient Education method: Explanation, Demonstration, Tactile cues, and Verbal cues Education comprehension: verbalized understanding, returned demonstration, verbal cues required, tactile cues required, and needs further education   HOME EXERCISE PROGRAM:    PT Short Term Goals -       PT SHORT TERM GOAL #1   Title Pt will be independent with HEP in order to improve strength and balance in order to decrease fall risk and improve function at home and work.    Baseline 01/04/2021= No formal HEP in place; 12/12 no HEP in place; 05/10/2021-Patient and his father were able to report compliance with curent HEP consisting of mostly seated/reclined LE strengthening. Both verbalize no questions at this time.    Time 6    Period Weeks    Status Achieved    Target Date 02/15/21              PT Long Term Goals -      PT LONG TERM GOAL #1   Title Patient will increase BLE gross strength by 1/2 muscle grade to improve functional strength for improved independence with potential gait, increased standing tolerance and increased ADL ability.    Baseline 01/04/2021- Patient presents with 1/5 to 3-/5 B LE strength with MMT; 12/12: goal partially met for Left knee/hip; 05/10/2021= 2-/5 bilateal Hip flex; 3+/5 bilateral Knee ext; 06/21/2021= Patient presents with 2-/5 bilateral Hip flex; 3+/5 bilateral knee ext/flex; 2-/5 left ankle DF; 0/5 right ankle- and able to increase reps and resistance with LE's. 09/15/2021- Patient technically presents with 2-/5 B hip flex/abd/add - but he is able to raise his hip up to approx 100 deg which has improved. 3+/5 Bilateral knee ext, 2-/5 left ankle and 0/5 right ankle. 12/08/2021= Patient able to lift left knee at 110 deg of hip flex; presents with 3+/5 knee ext, 2-/5 left ankle DF and 0/5 right ankle DF, 2-/5 bilateral Hip abd in seated position.    Time 12     Period Weeks    Status On-going    Target Date 03/07/2022     PT LONG TERM GOAL #2   Title Patient will tolerate sitting unsupported demonstrating erect sitting posture for 15 minutes with CGA to demonstrate improved back extensor strength and improved sitting tolerance.    Baseline 01/04/2021- Patient confied to sitting in lift chair or electric power chair with back support and unable to sit upright without physical assistance; 12/12: tolerates <1 minutes upright unsupported sitting. 05/10/2021=static sit with forward trunk lean  in his power wheelchair without back support x approx 3 min. 06/21/2021=Unable to assess today due to patient with acute back pain but on previous visit able to sit x 8 min without back support. 09/15/2021- on last visit- 09/13/2021- patient was able to sit unsupported x 8 min at edge of mat. 10/13/2023 - Patient was able to sit at edge of mat with varying level of assist today from SBA to min A for a total of 20 min. 12/13/2021= Patient demonstrated unsupported sitting at  edge of mat for approx 20 min   Time 12    Period Weeks    Status GOAL MET   Target Date 12/08/21      PT LONG TERM GOAL #3   Title Patient will demonstrate ability to perform static standing in // bars > 2 min with Max Assist  without loss of balance and fair posture for improved overall strength for pre-gait and transfer activities.    Baseline 01/04/2021= Patient current uanble to stand- Dependent on hoyer or sit to stand lift for transfers. 05/10/2021=Not appropriate yet- Currently still dependent with all transfers using hoyer. 06/21/2021= Patient continuing now to focus on LE strengthening to prepare for standing-unable to try today due to acute low back pain-  planning on attempting in new cert period. 09/15/2021- Patient has attempted standing 2x in past two week- max Assist of 2 people - only once was he successful to clearing his bottom from chair - Will continue to be a focus during the new certification.  12/13/2021= Patient has been limited secondary to increased overall low back pain during this certification and will require more time to focus on this goal.    Time 12    Period Weeks    Status On-going    Target Date 03/07/2022     PT LONG TERM GOAL #4   Title Pt will improve FOTO score by 10 points or more demonstrating improved perceived functional ability    Baseline FOTO 7 on 10/17; 03/15/21: FOTO 12; 05/10/2021 06/21/2021= 1; 09/15/2021= 9; 12/13/2021= Will issue next visit    Time 12    Period Weeks    Status On-going    Target Date 03/07/2022     PT LONG TERM GOAL #5   Title Patient will perform sit to stand transfer with appropriate AD and max assist of 2 people with 75% consistency to prepare for pregait activities.    Baseline 09/15/2021= Patient unable to stand well- unable to clear his bottom off chair with Max assist of 2 persons. 12/13/2021- Goal not appropriate to try yet but will keep and roll over to next cert as shift continues to focus on transfers/standing   Time 12    Period Weeks    Status New    Target Date 03/07/2022   PT LONG TERM GOAL #6  Title Patient will tolerate sitting unsupported demonstrating erect sitting posture for 30 minutes with CGA to demonstrate improved back extensor strength and improved sitting tolerance.   Baseline 12/13/2021= Patient demonstrated unsupported sitting at edge of mat for approx 20 min;  12/29/2021- Patient performed approx 30 min of dynamic sitting activities today.   Time 12   Period Weeks   Status ONGOING  Target Date 03/07/2022            Plan     Clinical Impression Statement Patient was well motivated for today's session- agreeable to transfer and later standing. Treatment focused more on static/dynamic sitting without back support. Patient reported increased overall fatigue from legs and back and later attempted standing yet fatigued from over 25 min of sitting today. He was more confident with lateral weight shift yet requires  some encouragement to lean forward and will benefit from further training.  Patient will benefit from additional skilled PT intervention to improve strength, balance, and mobility for improved quality of life.    Personal Factors and Comorbidities Comorbidity 3+;Time since onset of injury/illness/exacerbation    Comorbidities CVA, diabetes, Seizures    Examination-Activity Limitations Bathing;Bed Mobility;Bend;Caring  for Others;Carry;Dressing;Hygiene/Grooming;Lift;Locomotion Level;Reach Overhead;Self Feeding;Sit;Squat;Stairs;Stand;Transfers;Toileting    Examination-Participation Restrictions Cleaning;Community Activity;Driving;Laundry;Medication Management;Meal Prep;Occupation;Personal Finances;Shop;Yard Work;Volunteer    Stability/Clinical Decision Making Evolving/Moderate complexity    Rehab Potential Fair    PT Frequency 2x / week    PT Duration 12 weeks    PT Treatment/Interventions ADLs/Self Care Home Management;Cryotherapy;Electrical Stimulation;Moist Heat;Ultrasound;DME Instruction;Gait training;Stair training;Functional mobility training;Therapeutic exercise;Balance training;Patient/family education;Orthotic Fit/Training;Neuromuscular re-education;Wheelchair mobility training;Manual techniques;Passive range of motion;Dry needling;Energy conservation;Taping;Visual/perceptual remediation/compensation;Joint Manipulations    PT Next Visit Plan core strength- sitting at EOM, postural control, sabina sit to stand if able; Continue with progressive LE Strengthening    PT Home Exercise Plan No changes to HEP today    Consulted and Agree with Plan of Care Patient;Family member/caregiver    Family Member Consulted Dad               Particia Lather, [place] 02/23/2022, 7:49 AM

## 2022-02-28 ENCOUNTER — Ambulatory Visit: Payer: BC Managed Care – PPO

## 2022-02-28 ENCOUNTER — Ambulatory Visit: Payer: BC Managed Care – PPO | Admitting: Occupational Therapy

## 2022-02-28 ENCOUNTER — Encounter: Payer: Self-pay | Admitting: Occupational Therapy

## 2022-02-28 DIAGNOSIS — R278 Other lack of coordination: Secondary | ICD-10-CM

## 2022-02-28 DIAGNOSIS — M6281 Muscle weakness (generalized): Secondary | ICD-10-CM | POA: Diagnosis not present

## 2022-02-28 DIAGNOSIS — R2689 Other abnormalities of gait and mobility: Secondary | ICD-10-CM | POA: Diagnosis not present

## 2022-02-28 DIAGNOSIS — I6930 Unspecified sequelae of cerebral infarction: Secondary | ICD-10-CM

## 2022-02-28 DIAGNOSIS — R269 Unspecified abnormalities of gait and mobility: Secondary | ICD-10-CM | POA: Diagnosis not present

## 2022-02-28 DIAGNOSIS — R2681 Unsteadiness on feet: Secondary | ICD-10-CM

## 2022-02-28 DIAGNOSIS — R262 Difficulty in walking, not elsewhere classified: Secondary | ICD-10-CM

## 2022-02-28 DIAGNOSIS — H543 Unqualified visual loss, both eyes: Secondary | ICD-10-CM | POA: Diagnosis not present

## 2022-02-28 NOTE — Therapy (Addendum)
OUTPATIENT OCCUPATIONAL THERAPY TREATMENT  Patient Name: Paul Hall MRN: [id number] DOB:[birth year], 26 y.o., male Today's Date: 02/28/2022  PCP: Dr. Sherwood GamblerFusco REFERRING PROVIDER: Dr. Sherwood GamblerFusco   OT End of Session - 02/28/22 1545     Visit Number 7    Number of Visits 12    Date for OT Re-Evaluation 04/11/22    Authorization Type Progress report period starting 10/06/2021    OT Start Time 1447    OT Stop Time 1515    OT Time Calculation (min) 28 min    Activity Tolerance Patient tolerated treatment well    Behavior During Therapy WFL for tasks assessed/performed                 Past Medical History:  Diagnosis Date   Diabetes mellitus ([facility])    Hypertension    Stroke [facility](HCC)    Past Surgical History:  Procedure Laterality Date   IVC FILTER PLACEMENT ([facility] HX)     LEG SURGERY     PEG TUBE PLACEMENT     TRACHEOSTOMY     Patient Active Problem List   Diagnosis Date Noted   Sepsis due to vancomycin resistant Enterococcus species (HCC) 06/06/2019   SIRS (systemic inflammatory response syndrome) (HCC) 06/05/2019   Acute lower UTI 06/05/2019   VRE (vancomycin-resistant Enterococci) infection 06/05/2019   Anemia 06/05/2019   Skin ulcer of sacrum with necrosis of muscle (HCC)    Urinary retention    Type 2 diabetes mellitus without complication, with long-term current use of insulin (HCC)    Tachycardia    Lower extremity edema    Acute metabolic encephalopathy    Obstructive sleep apnea    Morbid obesity with BMI of 60.0-69.9, adult (HCC)    Goals of care, counseling/discussion    Palliative care encounter    Sepsis (HCC) 04/27/2019   H/O insulin dependent diabetes mellitus 04/27/2019   History of CVA with residual deficit 04/27/2019   Seizure disorder (HCC) 04/27/2019   Decubitus ulcer of sacral region, stage 4 (HCC) 04/27/2019    ONSET DATE: 01/2019  REFERRING DIAG: CVA/COVID-19  THERAPY DIAG:  Muscle weakness (generalized)  Other lack of  coordination  Rationale for Evaluation and Treatment Rehabilitation  SUBJECTIVE:   Patient reports that transportation was late picking him up this afternoon.  SUBJECTIVE STATEMENT:  Pt. reports feeling okay today  Pt accompanied by: self and family member  PERTINENT HISTORY:  Pt. is a 26 y.o. male who was diagnosed with COVID-19, and CVA with resultant quadriplegia. Pt. Was then hospitalized with VRE UTI. PMHx includes: urinary retention, seizure disorder, obstructive sleep disorder, DM Type II, Morbid obesity.   PRECAUTIONS: None  WEIGHT BEARING RESTRICTIONS No  PAIN:  Are you having pain? No  FALLS: Has patient fallen in last 6 months? No  LIVING ENVIRONMENT: Lives with: lives with their family Lives in: House/apartment Stairs: No Level Entry Has following equipment at home: Wheelchair (power) and hoyer lift, sit to stand lift  PLOF: Independent  PATIENT GOALS: To be able to engage in more daily care tasks.  OBJECTIVE:   HAND DOMINANCE: Right  ADLs:  Transfers/ambulation related to ADLs: Eating: Pt. Reports being able to hold standard utensils, and is starting to engage more in self-feeding tasks, hand to mouth patterns. Pt. Reports that he does as much as he can with the task, and family assists  with the remainder of the task. Grooming: Pt. Is able to initiate holding an electric toothbrush, and brush his teeth. Family assists  LB Dressing: Total Assist UE dressing: Pt. is now able to reach up to actively assist with grasping , and pulling his gown down. Toileting:  Total Assist Bathing: MaxA UB, Total assist LB Tub Shower transfers: N/A Equipment: See above    IADLs: Shopping: Relies on family to assist Light housekeeping: Total Assist Meal Prep: Total Assist Community mobility:   Medication management:  Total Assist  Financial management: N/A Handwriting: Not legible: Pt. Is able to hold a pen with the left hand, and initiate marking the page. Pt.'s  eye glasses were not available  MOBILITY STATUS:  Power w/c  POSTURE COMMENTS:  Pt. Requires position changes in his power w/c  ACTIVITY TOLERANCE: Activity tolerance:  Fair  FUNCTIONAL OUTCOME MEASURES: FOTO: 40  TR score: 45  UPPER EXTREMITY ROM     Active ROM Right eval Left eval  Shoulder flexion 106 scaption 119  Shoulder abduction 114 110  Shoulder adduction    Shoulder extension    Shoulder internal rotation    Shoulder external rotation    Elbow flexion 120(130) 135(135)  Elbow extension -45(-35) -27(-18)  Wrist flexion    Wrist extension -30(10) 10(50  Wrist ulnar deviation    Wrist radial deviation    Wrist pronation    Wrist supination    (Blank rows = not tested)   UPPER EXTREMITY MMT:     Right eval Left eval  Shoulder flexion 3-/5 scaption 3-/5  Shoulder abduction 3-/5 3-/5  Shoulder adduction    Shoulder extension    Shoulder internal rotation    Shoulder external rotation    Elbow flexion 3/5 3/5  Elbow extension 2-/5 2-/5  Wrist flexion    Wrist extension 2-/5 2/5  Wrist ulnar deviation    Wrist radial deviation    Wrist pronation    Wrist supination    (Blank rows = not tested)    HAND FUNCTION:  Grip strength: Right: 0 lbs; Left: 0 lbs and Lateral pinch: Right: 5 lbs, Left: 2 lbs  Bilateral digit PIP/DIP flexion contractures with MP hyperextension with attempts for AROM. Pt. is able to tolerate AROM to the bilateral digits at the initial evaluation however, has a history of pain in the digits.  COORDINATION:  Pt. is unable to grasp 9-hole test pegs. Pt. is able to initiate grasping larger pegs, and is able to hold a pen in the left hand.  SENSATION: Light touch: Impaired   EDEMA:  N/A  MUSCLE TONE: BUE flexor Spasticity  COGNITIO Overall cognitive status: Continue to assess in functional context  VISION:   Subjective report: Pt. Was not wearing glasses at the time of the initial eval.  Baseline vision: Vision is very  limited. Wears glasses all the time Visual history: History of impaired vision following CVA. Pt. Has received [facility] through the [facility] low vision rehabilitation program.   VISION ASSESSMENT: Impaired To be further assessed in functional context  PERCEPTION: Impaired   PRAXIS: Impaired: motor planning  OBSERVATIONS:  Pt reports being on  Tramadol   TODAY'S TREATMENT:   Neuromuscular reeducation:  Pt. worked on tasks promoting bilateral UE midline orientation. Pt. worked on connecting, and disconnecting  PCP PIPING connectors with each hand while stabilizing the pipe at midline with the opposite hand. Pt. worked on progressively decreasing the size of the  connectors closer to midline. Pt. Worked on repetitions with the right, and left hand reaching up into abduction, flexion, reaching out to actively release the pieces into a container placed at  the tabletop. Pt. Worked on reps of active forearm supination prior to releasing them into the container.   Pt. required hand-over-hand assist to stabilize the piece in midline with the right hand while connecting them with the left hand. Pt. Required visual, and verbal cues for visual compensatory strategies as pt. Often overshoots the target  when attempting to connect them with the left hand in midline. Pt. Required the pieces to be moved to the far left when preparing to grasp them off the tabletop surface with the right hand. Pt. continues to work on improving bilateral upper extremity functioning in midline in preparation for improving engagement in ADL, and IADL tasks.    Marland Kitchen  PATIENT EDUCATION: Education details: OT services, POC, Goals Person educated: Patient and Parent Education method: Explanation, Demonstration, Tactile cues, and Verbal cues Education comprehension: verbalized understanding, returned demonstration, verbal cues required, and needs further education   HOME EXERCISE PROGRAM:   Continue ongoing assessment, and  continue to provide as needed.     GOALS: Goals reviewed with patient? Yes  SHORT TERM GOALS: Target date: 02/14/2022    To assess splint fit, and make appropriate adjustments to promote good skin integrity through the palmar surface of the bilateral hands.  Baseline: Bilateral resting hand splints. Goal status: INITIAL   LONG TERM GOALS: Target date: 03/14/2022     FOTO score will Improve by 2 points for Pt. perceived improvement with the assessment specific ADL/IADL tasks.  Baseline: Eval: FOTO score: 40,  TR score: 45 Goal status: INITIAL  2.   Pt. will independently perform oral care for 100% of the task after complete set-up. Baseline: Eval: Pt. is able to initiate using an electric toothbrush. Pt. requires assist for set-up, and assist for thoroughness, and as  thePt. fatigues. Goal status: INITIAL  3.  Pt. Will be independent with self-feeding for 100% of the meal after complete set-up Baseline: Eval: Pt. is able to hold standard standard utensils. Pt. Performs as much of the task as he, can and has assistance for the remainder. Goal status: INITIAL  4.  Pt. Will improve grasp patterns and consistently grasp 1/2" objects for ADL, and IADL tasks.  Baseline: Eval: Pt. is able to grasp 1" objects intermittently using a lateral grasp pattern. Goal status: INITIAL  5.  Pt. Will legibly write one, out of 3 letters of his name legibly. Baseline: Eval: Pt is able to hold a thin marker with his left hand, and formulate a a line, and initiate a circular pattern (Pt. without glasses today) Goal status: INITIAL  6. Pt. Will reach up to comb/brush his hair  with minA.     Baseline: Pt. is able to initiate reaching up for hair care with a long handled brush, however is unable to sustain UE's in elevation to perform the task.     Goal status: INITIAL      ASSESSMENT:  CLINICAL IMPRESSION:  Pt. required hand-over-hand assist to stabilize the piece in midline with the right  hand while connecting them with the left hand. Pt. Required visual, and verbal cues for visual compensatory strategies as pt. Often overshoots the target  when attempting to connect them with the left hand in midline. Pt. Required the pieces to be moved to the far left when preparing to grasp them off the tabletop surface with the right hand. Pt. continues to work on improving bilateral upper extremity functioning in midline in preparation for improving engagement in ADL, and IADL tasks.  PERFORMANCE DEFICITS  in functional skills including ADLs, IADLs, coordination, dexterity, proprioception, ROM, strength, pain, FMC, GMC, decreased knowledge of use of DME, and UE functional use, cognitive skills including safety awareness, and psychosocial skills including coping strategies, environmental adaptation, habits, and routines and behaviors.   IMPAIRMENTS are limiting patient from ADLs, IADLs, leisure, and social participation.   COMORBIDITIES may have co-morbidities  that affects occupational performance. Patient will benefit from skilled OT to address above impairments and improve overall function.  MODIFICATION OR ASSISTANCE TO COMPLETE EVALUATION: Maximum or significant modification of tasks or assist is necessary to complete an evaluation.  OT OCCUPATIONAL PROFILE AND HISTORY: Comprehensive assessment: Review of records and extensive additional review of physical, cognitive, psychosocial history related to current functional performance.  CLINICAL DECISION MAKING: High - multiple treatment options, significant modification of task necessary  REHAB POTENTIAL: Fair    EVALUATION COMPLEXITY: High    PLAN: OT FREQUENCY: 1x/week  OT DURATION:8 weeks  PLANNED INTERVENTIONS: self care/ADL training, therapeutic exercise, therapeutic activity, neuromuscular re-education, manual therapy, passive range of motion, patient/family education, and cognitive remediation/compensation  RECOMMENDED OTHER  SERVICES: PT  CONSULTED AND AGREED WITH PLAN OF CARE: Patient and family member/caregiver  PLAN FOR NEXT SESSION: Initiate treatment   Olegario Messier, MS, OTR/L   Olegario Messier, OT 02/28/2022, 3:50 PM

## 2022-02-28 NOTE — Therapy (Signed)
OUTPATIENT PHYSICAL THERAPY TREATMENT NOTE Patient Name: Paul Hall MRN: [id number] DOB:September 15, 1995, 26 y.o., male Today's Date: 03/01/2022  PCP:  Loistine Chance PROVIDER:  Collene Mares, PA-C   PT End of Session - 02/28/22 1533     Visit Number 74    Number of Visits 81    Date for PT Re-Evaluation 03/07/22    Authorization Type [employer] and [employer]- Need auth past 12th visit    Authorization Time Period 01/04/21-03/29/21; Recert 40/[id number]/[id number]/78/2956; Recert 05/18/863- 10/10/4694; Recert 2/95/2841-32/07/4008    Progress Note Due on Visit 80    PT Start Time 1516    PT Stop Time 1558    PT Time Calculation (min) 42 min    Equipment Utilized During Treatment Gait belt   Hoyer lift   Activity Tolerance Patient tolerated treatment well   queasiness/nausea   Behavior During Therapy WFL for tasks assessed/performed                  Past Medical History:  Diagnosis Date   Diabetes mellitus ([place])    Hypertension    Stroke ([place])    Past Surgical History:  Procedure Laterality Date   IVC FILTER PLACEMENT ([place] HX)     LEG SURGERY     PEG TUBE PLACEMENT     TRACHEOSTOMY     Patient Active Problem List   Diagnosis Date Noted   Sepsis due to vancomycin resistant Enterococcus species ([place]) 06/06/2019   SIRS (systemic inflammatory response syndrome) ([place]) 06/05/2019   Acute lower UTI 06/05/2019   VRE (vancomycin-resistant Enterococci) infection 06/05/2019   Anemia 06/05/2019   Skin ulcer of sacrum with necrosis of muscle ([place])    Urinary retention    Type 2 diabetes mellitus without complication, with long-term current use of insulin (HCC)    Tachycardia    Lower extremity edema    Acute metabolic encephalopathy    Obstructive sleep apnea    Morbid obesity with BMI of 60.0-69.9, adult ([place])    Goals of care, counseling/discussion    Palliative care encounter    Sepsis ([place]) 04/27/2019   H/O insulin dependent diabetes mellitus 04/27/2019   History of CVA  with residual deficit 04/27/2019   Seizure disorder ([place]) 04/27/2019   Decubitus ulcer of sacral region, stage 4 ([place]) 04/27/2019    REFERRING DIAG: Cerebral infarction, unspecified   THERAPY DIAG:  Muscle weakness (generalized)  Other lack of coordination  History of CVA with residual deficit  Difficulty in walking, not elsewhere classified  Unsteadiness on feet  Abnormality of gait and mobility  Other abnormalities of gait and mobility  Rationale for Evaluation and Treatment Rehabilitation  PERTINENT HISTORY: Paul Hall is a 25yoM who presents with severe weakness, quadriparesis, altered sensorium, and visual impairment s/p critical illness and prolonged hospitalization. Pt hospitalized in October 2020 with ARDS 2/2 COVID19 infection. Pt sustained a complex and lengthy hospitalization which included tracheostomy, prolonged sedation, ECMO. In this period pt sustained CVA and SDH. Pt has now been liberated from tracheostomy and G-tube. Pt has since been hospitalized for wound infection and UTI. Pt lives with parents at home, has hospital bed and left chair, hoyer lift transfers, and power WC for mobility needs. Pt needs heavy physical assistance with ADL 2/2 BUE contractures and motor dysfunction   PRECAUTIONS: Fall  SUBJECTIVE: Patient reports he had a good weekend and feeling pretty good today.   Patient reports PAIN:  Are you having pain? No   TODAY'S TREATMENT:     Therex:    (  patient in power wheelchair today)      PROM to B LE- hip rotators, flex/ext, hamstrings/gastoc/achilles  Seated hip march: 1st set of 12 reps without weight (patient able to lift his foot off footplate then required min assist to achieve full ROM)   2nd set with 2lb on left x10 reps; Right LE without weight x 12 reps.   3rd set with 3lb on left x 8 reps; Right LE without weight x 12 reps   Seated knee ext - Pyramid-    1st set= 12 reps without weight  each LE   2nd set = 2lb x 10 reps     3rd set= 3lb x 8 reps with 3 lb AW each LE  Seated hip add with manual resistance- 5 sec hold  2 sets x 15 reps  Seated hip abd with manual resistance X 15 reps each LE   Seated ham curl with manual resistance x 3 sec hold x 15 reps each LE.    AAROM Left ankle DF/PF x 15 reps; PROM to right ankle DF/PF x 20 reps     Education provided throughout session via VC/TC and demonstration to facilitate movement at target joints and co rrect muscle activation for all testing and exercises performed.       PATIENT EDUCATION: Education details: posture cues with sitting Person educated: Patient Education method: Explanation, Demonstration, Tactile cues, and Verbal cues Education comprehension: verbalized understanding, returned demonstration, verbal cues required, tactile cues required, and needs further education   HOME EXERCISE PROGRAM:    PT Short Term Goals -       PT SHORT TERM GOAL #1   Title Pt will be independent with HEP in order to improve strength and balance in order to decrease fall risk and improve function at home and work.    Baseline 01/04/2021= No formal HEP in place; 12/12 no HEP in place; 05/10/2021-Patient and his father were able to report compliance with curent HEP consisting of mostly seated/reclined LE strengthening. Both verbalize no questions at this time.    Time 6    Period Weeks    Status Achieved    Target Date 02/15/21              PT Long Term Goals -      PT LONG TERM GOAL #1   Title Patient will increase BLE gross strength by 1/2 muscle grade to improve functional strength for improved independence with potential gait, increased standing tolerance and increased ADL ability.    Baseline 01/04/2021- Patient presents with 1/5 to 3-/5 B LE strength with MMT; 12/12: goal partially met for Left knee/hip; 05/10/2021= 2-/5 bilateal Hip flex; 3+/5 bilateral Knee ext; 06/21/2021= Patient presents with 2-/5 bilateral Hip flex; 3+/5 bilateral knee ext/flex;  2-/5 left ankle DF; 0/5 right ankle- and able to increase reps and resistance with LE's. 09/15/2021- Patient technically presents with 2-/5 B hip flex/abd/add - but he is able to raise his hip up to approx 100 deg which has improved. 3+/5 Bilateral knee ext, 2-/5 left ankle and 0/5 right ankle. 12/08/2021= Patient able to lift left knee at 110 deg of hip flex; presents with 3+/5 knee ext, 2-/5 left ankle DF and 0/5 right ankle DF, 2-/5 bilateral Hip abd in seated position.    Time 12    Period Weeks    Status On-going    Target Date 03/07/2022     PT LONG TERM GOAL #2   Title Patient will tolerate sitting unsupported  demonstrating erect sitting posture for 15 minutes with CGA to demonstrate improved back extensor strength and improved sitting tolerance.    Baseline 01/04/2021- Patient confied to sitting in lift chair or electric power chair with back support and unable to sit upright without physical assistance; 12/12: tolerates <1 minutes upright unsupported sitting. 05/10/2021=static sit with forward trunk lean  in his power wheelchair without back support x approx 3 min. 06/21/2021=Unable to assess today due to patient with acute back pain but on previous visit able to sit x 8 min without back support. 09/15/2021- on last visit- 09/13/2021- patient was able to sit unsupported x 8 min at edge of mat. 10/13/2023 - Patient was able to sit at edge of mat with varying level of assist today from SBA to min A for a total of 20 min. 12/13/2021= Patient demonstrated unsupported sitting at edge of mat for approx 20 min   Time 12    Period Weeks    Status GOAL MET   Target Date 12/08/21      PT LONG TERM GOAL #3   Title Patient will demonstrate ability to perform static standing in // bars > 2 min with Max Assist  without loss of balance and fair posture for improved overall strength for pre-gait and transfer activities.    Baseline 01/04/2021= Patient current uanble to stand- Dependent on hoyer or sit to stand lift for  transfers. 05/10/2021=Not appropriate yet- Currently still dependent with all transfers using hoyer. 06/21/2021= Patient continuing now to focus on LE strengthening to prepare for standing-unable to try today due to acute low back pain-  planning on attempting in new cert period. 09/15/2021- Patient has attempted standing 2x in past two week- max Assist of 2 people - only once was he successful to clearing his bottom from chair - Will continue to be a focus during the new certification. 12/13/2021= Patient has been limited secondary to increased overall low back pain during this certification and will require more time to focus on this goal.    Time 12    Period Weeks    Status On-going    Target Date 03/07/2022     PT LONG TERM GOAL #4   Title Pt will improve FOTO score by 10 points or more demonstrating improved perceived functional ability    Baseline FOTO 7 on 10/17; 03/15/21: FOTO 12; 05/10/2021 06/21/2021= 1; 09/15/2021= 9; 12/13/2021= Will issue next visit    Time 12    Period Weeks    Status On-going    Target Date 03/07/2022     PT LONG TERM GOAL #5   Title Patient will perform sit to stand transfer with appropriate AD and max assist of 2 people with 75% consistency to prepare for pregait activities.    Baseline 09/15/2021= Patient unable to stand well- unable to clear his bottom off chair with Max assist of 2 persons. 12/13/2021- Goal not appropriate to try yet but will keep and roll over to next cert as shift continues to focus on transfers/standing   Time 12    Period Weeks    Status New    Target Date 03/07/2022   PT LONG TERM GOAL #6  Title Patient will tolerate sitting unsupported demonstrating erect sitting posture for 30 minutes with CGA to demonstrate improved back extensor strength and improved sitting tolerance.   Baseline 12/13/2021= Patient demonstrated unsupported sitting at edge of mat for approx 20 min;  12/29/2021- Patient performed approx 30 min of dynamic sitting activities today.  Time 12   Period Weeks   Status ONGOING  Target Date 03/07/2022            Plan     Clinical Impression Statement Patient presented with good motivation for strengthening despite stating not feeling well enough to attempt to stand today. He responded well to verbal cues and instruction for best technique. He continues to present with progress with Right LE strength as seen by ability to add some ankle weight resistance to right LE and able to ext knee with additional resistance to possibly assist with future weight bearing/standing activities.  Patient will benefit from additional skilled PT intervention to improve strength, balance, and mobility for improved quality of life.    Personal Factors and Comorbidities Comorbidity 3+;Time since onset of injury/illness/exacerbation    Comorbidities CVA, diabetes, Seizures    Examination-Activity Limitations Bathing;Bed Mobility;Bend;Caring for Others;Carry;Dressing;Hygiene/Grooming;Lift;Locomotion Level;Reach Overhead;Self Feeding;Sit;Squat;Stairs;Stand;Transfers;Toileting    Examination-Participation Restrictions Cleaning;Community Activity;Driving;Laundry;Medication Management;Meal Prep;Occupation;Personal Finances;Shop;Yard Work;Volunteer    Stability/Clinical Decision Making Evolving/Moderate complexity    Rehab Potential Fair    PT Frequency 2x / week    PT Duration 12 weeks    PT Treatment/Interventions ADLs/Self Care Home Management;Cryotherapy;Electrical Stimulation;Moist Heat;Ultrasound;DME Instruction;Gait training;Stair training;Functional mobility training;Therapeutic exercise;Balance training;Patient/family education;Orthotic Fit/Training;Neuromuscular re-education;Wheelchair mobility training;Manual techniques;Passive range of motion;Dry needling;Energy conservation;Taping;Visual/perceptual remediation/compensation;Joint Manipulations    PT Next Visit Plan core strength- sitting at EOM, postural control, sabina sit to stand if able;  Continue with progressive LE Strengthening    PT Home Exercise Plan No changes to HEP today    Consulted and Agree with Plan of Care Patient;Family member/caregiver    Family Member Consulted Dad               Lewis Moccasin, PT 03/01/2022, 3:43 PM

## 2022-03-02 ENCOUNTER — Ambulatory Visit: Payer: BC Managed Care – PPO

## 2022-03-02 NOTE — Therapy (Incomplete)
OUTPATIENT PHYSICAL THERAPY TREATMENT NOTE Patient Name: Paul Hall MRN: [id number] DOB:May 08, 1995, 26 y.o., male Today's Date: 03/02/2022  PCP:  Loistine Chance PROVIDER:  Collene Mares, PA-C          Past Medical History:  Diagnosis Date   Diabetes mellitus ([place])    Hypertension    Stroke [facility])    Past Surgical History:  Procedure Laterality Date   IVC FILTER PLACEMENT ([place] HX)     LEG SURGERY     PEG TUBE PLACEMENT     TRACHEOSTOMY     Patient Active Problem List   Diagnosis Date Noted   Sepsis due to vancomycin resistant Enterococcus species ([place]) 06/06/2019   SIRS (systemic inflammatory response syndrome) ([place]) 06/05/2019   Acute lower UTI 06/05/2019   VRE (vancomycin-resistant Enterococci) infection 06/05/2019   Anemia 06/05/2019   Skin ulcer of sacrum with necrosis of muscle ([place])    Urinary retention    Type 2 diabetes mellitus without complication, with long-term current use of insulin (HCC)    Tachycardia    Lower extremity edema    Acute metabolic encephalopathy    Obstructive sleep apnea    Morbid obesity with BMI of 60.0-69.9, adult ([place])    Goals of care, counseling/discussion    Palliative care encounter    Sepsis ([place]) 04/27/2019   H/O insulin dependent diabetes mellitus 04/27/2019   History of CVA with residual deficit 04/27/2019   Seizure disorder ([place]) 04/27/2019   Decubitus ulcer of sacral region, stage 4 ([place]) 04/27/2019    REFERRING DIAG: Cerebral infarction, unspecified   THERAPY DIAG:  No diagnosis found.  Rationale for Evaluation and Treatment Rehabilitation  PERTINENT HISTORY: Maher Shon is a 25yoM who presents with severe weakness, quadriparesis, altered sensorium, and visual impairment s/p critical illness and prolonged hospitalization. Pt hospitalized in October 2020 with ARDS 2/2 COVID19 infection. Pt sustained a complex and lengthy hospitalization which included tracheostomy, prolonged sedation, ECMO. In this  period pt sustained CVA and SDH. Pt has now been liberated from tracheostomy and G-tube. Pt has since been hospitalized for wound infection and UTI. Pt lives with parents at home, has hospital bed and left chair, hoyer lift transfers, and power WC for mobility needs. Pt needs heavy physical assistance with ADL 2/2 BUE contractures and motor dysfunction   PRECAUTIONS: Fall  SUBJECTIVE: Patient reports he had a good weekend and feeling pretty good today.   Patient reports PAIN:  Are you having pain? No   TODAY'S TREATMENT:     Therex:    (patient in power wheelchair today)      PROM to B LE- hip rotators, flex/ext, hamstrings/gastoc/achilles  Seated hip march: 1st set of 12 reps without weight (patient able to lift his foot off footplate then required min assist to achieve full ROM)   2nd set with 2lb on left x10 reps; Right LE without weight x 12 reps.   3rd set with 3lb on left x 8 reps; Right LE without weight x 12 reps   Seated knee ext - Pyramid-    1st set= 12 reps without weight  each LE   2nd set = 2lb x 10 reps    3rd set= 3lb x 8 reps with 3 lb AW each LE  Seated hip add with manual resistance- 5 sec hold  2 sets x 15 reps  Seated hip abd with manual resistance X 15 reps each LE   Seated ham curl with manual resistance x 3 sec hold x 15 reps  each LE.    AAROM Left ankle DF/PF x 15 reps; PROM to right ankle DF/PF x 20 reps     Education provided throughout session via VC/TC and demonstration to facilitate movement at target joints and co rrect muscle activation for all testing and exercises performed.       PATIENT EDUCATION: Education details: posture cues with sitting Person educated: Patient Education method: Explanation, Demonstration, Tactile cues, and Verbal cues Education comprehension: verbalized understanding, returned demonstration, verbal cues required, tactile cues required, and needs further education   HOME EXERCISE PROGRAM:    PT Short Term  Goals -       PT SHORT TERM GOAL #1   Title Pt will be independent with HEP in order to improve strength and balance in order to decrease fall risk and improve function at home and work.    Baseline 01/04/2021= No formal HEP in place; 12/12 no HEP in place; 05/10/2021-Patient and his father were able to report compliance with curent HEP consisting of mostly seated/reclined LE strengthening. Both verbalize no questions at this time.    Time 6    Period Weeks    Status Achieved    Target Date 02/15/21              PT Long Term Goals -      PT LONG TERM GOAL #1   Title Patient will increase BLE gross strength by 1/2 muscle grade to improve functional strength for improved independence with potential gait, increased standing tolerance and increased ADL ability.    Baseline 01/04/2021- Patient presents with 1/5 to 3-/5 B LE strength with MMT; 12/12: goal partially met for Left knee/hip; 05/10/2021= 2-/5 bilateal Hip flex; 3+/5 bilateral Knee ext; 06/21/2021= Patient presents with 2-/5 bilateral Hip flex; 3+/5 bilateral knee ext/flex; 2-/5 left ankle DF; 0/5 right ankle- and able to increase reps and resistance with LE's. 09/15/2021- Patient technically presents with 2-/5 B hip flex/abd/add - but he is able to raise his hip up to approx 100 deg which has improved. 3+/5 Bilateral knee ext, 2-/5 left ankle and 0/5 right ankle. 12/08/2021= Patient able to lift left knee at 110 deg of hip flex; presents with 3+/5 knee ext, 2-/5 left ankle DF and 0/5 right ankle DF, 2-/5 bilateral Hip abd in seated position.    Time 12    Period Weeks    Status On-going    Target Date 03/07/2022     PT LONG TERM GOAL #2   Title Patient will tolerate sitting unsupported demonstrating erect sitting posture for 15 minutes with CGA to demonstrate improved back extensor strength and improved sitting tolerance.    Baseline 01/04/2021- Patient confied to sitting in lift chair or electric power chair with back support and unable to  sit upright without physical assistance; 12/12: tolerates <1 minutes upright unsupported sitting. 05/10/2021=static sit with forward trunk lean  in his power wheelchair without back support x approx 3 min. 06/21/2021=Unable to assess today due to patient with acute back pain but on previous visit able to sit x 8 min without back support. 09/15/2021- on last visit- 09/13/2021- patient was able to sit unsupported x 8 min at edge of mat. 10/13/2023 - Patient was able to sit at edge of mat with varying level of assist today from SBA to min A for a total of 20 min. 12/13/2021= Patient demonstrated unsupported sitting at edge of mat for approx 20 min   Time 12    Period Weeks  Status GOAL MET   Target Date 12/08/21      PT LONG TERM GOAL #3   Title Patient will demonstrate ability to perform static standing in // bars > 2 min with Max Assist  without loss of balance and fair posture for improved overall strength for pre-gait and transfer activities.    Baseline 01/04/2021= Patient current uanble to stand- Dependent on hoyer or sit to stand lift for transfers. 05/10/2021=Not appropriate yet- Currently still dependent with all transfers using hoyer. 06/21/2021= Patient continuing now to focus on LE strengthening to prepare for standing-unable to try today due to acute low back pain-  planning on attempting in new cert period. 09/15/2021- Patient has attempted standing 2x in past two week- max Assist of 2 people - only once was he successful to clearing his bottom from chair - Will continue to be a focus during the new certification. 12/13/2021= Patient has been limited secondary to increased overall low back pain during this certification and will require more time to focus on this goal.    Time 12    Period Weeks    Status On-going    Target Date 03/07/2022     PT LONG TERM GOAL #4   Title Pt will improve FOTO score by 10 points or more demonstrating improved perceived functional ability    Baseline FOTO 7 on 10/17;  03/15/21: FOTO 12; 05/10/2021 06/21/2021= 1; 09/15/2021= 9; 12/13/2021= Will issue next visit    Time 12    Period Weeks    Status On-going    Target Date 03/07/2022     PT LONG TERM GOAL #5   Title Patient will perform sit to stand transfer with appropriate AD and max assist of 2 people with 75% consistency to prepare for pregait activities.    Baseline 09/15/2021= Patient unable to stand well- unable to clear his bottom off chair with Max assist of 2 persons. 12/13/2021- Goal not appropriate to try yet but will keep and roll over to next cert as shift continues to focus on transfers/standing   Time 12    Period Weeks    Status New    Target Date 03/07/2022   PT LONG TERM GOAL #6  Title Patient will tolerate sitting unsupported demonstrating erect sitting posture for 30 minutes with CGA to demonstrate improved back extensor strength and improved sitting tolerance.   Baseline 12/13/2021= Patient demonstrated unsupported sitting at edge of mat for approx 20 min;  12/29/2021- Patient performed approx 30 min of dynamic sitting activities today.   Time 12   Period Weeks   Status ONGOING  Target Date 03/07/2022            Plan     Clinical Impression Statement Patient presented with good motivation for strengthening despite stating not feeling well enough to attempt to stand today. He responded well to verbal cues and instruction for best technique. He continues to present with progress with Right LE strength as seen by ability to add some ankle weight resistance to right LE and able to ext knee with additional resistance to possibly assist with future weight bearing/standing activities.  Patient will benefit from additional skilled PT intervention to improve strength, balance, and mobility for improved quality of life.    Personal Factors and Comorbidities Comorbidity 3+;Time since onset of injury/illness/exacerbation    Comorbidities CVA, diabetes, Seizures    Examination-Activity Limitations  Bathing;Bed Mobility;Bend;Caring for Others;Carry;Dressing;Hygiene/Grooming;Lift;Locomotion Level;Reach Overhead;Self Feeding;Sit;Squat;Stairs;Stand;Transfers;Toileting    Examination-Participation Restrictions Cleaning;Community Activity;Driving;Laundry;Medication Management;Meal Prep;Occupation;Personal Finances;Shop;Yard Work;Volunteer  Stability/Clinical Decision Making Evolving/Moderate complexity    Rehab Potential Fair    PT Frequency 2x / week    PT Duration 12 weeks    PT Treatment/Interventions ADLs/Self Care Home Management;Cryotherapy;Electrical Stimulation;Moist Heat;Ultrasound;DME Instruction;Gait training;Stair training;Functional mobility training;Therapeutic exercise;Balance training;Patient/family education;Orthotic Fit/Training;Neuromuscular re-education;Wheelchair mobility training;Manual techniques;Passive range of motion;Dry needling;Energy conservation;Taping;Visual/perceptual remediation/compensation;Joint Manipulations    PT Next Visit Plan core strength- sitting at EOM, postural control, sabina sit to stand if able; Continue with progressive LE Strengthening    PT Home Exercise Plan No changes to HEP today    Consulted and Agree with Plan of Care Patient;Family member/caregiver    Family Member Consulted Dad               Lewis Moccasin, PT 03/02/2022, 2:59 PM

## 2022-03-07 ENCOUNTER — Ambulatory Visit: Payer: BC Managed Care – PPO

## 2022-03-07 ENCOUNTER — Ambulatory Visit: Payer: BC Managed Care – PPO | Admitting: Occupational Therapy

## 2022-03-07 NOTE — Therapy (Incomplete)
OUTPATIENT PHYSICAL THERAPY TREATMENT NOTE/RECERTIFICATION   Patient Name: Paul Hall MRN: [id number] DOB:1995-12-10, 26 y.o., male Today's Date: 03/07/2022  PCP:  Paul Hall PROVIDER:  Collene Mares, PA-C          Past Medical History:  Diagnosis Date   Diabetes mellitus ([place])    Hypertension    Stroke [facility])    Past Surgical History:  Procedure Laterality Date   IVC FILTER PLACEMENT ([place] HX)     LEG SURGERY     PEG TUBE PLACEMENT     TRACHEOSTOMY     Patient Active Problem List   Diagnosis Date Noted   Sepsis due to vancomycin resistant Enterococcus species ([place]) 06/06/2019   SIRS (systemic inflammatory response syndrome) ([place]) 06/05/2019   Acute lower UTI 06/05/2019   VRE (vancomycin-resistant Enterococci) infection 06/05/2019   Anemia 06/05/2019   Skin ulcer of sacrum with necrosis of muscle ([place])    Urinary retention    Type 2 diabetes mellitus without complication, with long-term current use of insulin (HCC)    Tachycardia    Lower extremity edema    Acute metabolic encephalopathy    Obstructive sleep apnea    Morbid obesity with BMI of 60.0-69.9, adult ([place])    Goals of care, counseling/discussion    Palliative care encounter    Sepsis ([place]) 04/27/2019   H/O insulin dependent diabetes mellitus 04/27/2019   History of CVA with residual deficit 04/27/2019   Seizure disorder ([place]) 04/27/2019   Decubitus ulcer of sacral region, stage 4 ([place]) 04/27/2019    REFERRING DIAG: Cerebral infarction, unspecified   THERAPY DIAG:  No diagnosis found.  Rationale for Evaluation and Treatment Rehabilitation  PERTINENT HISTORY: Paul Hall is a 25yoM who presents with severe weakness, quadriparesis, altered sensorium, and visual impairment s/p critical illness and prolonged hospitalization. Pt hospitalized in October 2020 with ARDS 2/2 COVID19 infection. Pt sustained a complex and lengthy hospitalization which included tracheostomy, prolonged sedation,  ECMO. In this period pt sustained CVA and SDH. Pt has now been liberated from tracheostomy and G-tube. Pt has since been hospitalized for wound infection and UTI. Pt lives with parents at home, has hospital bed and left chair, hoyer lift transfers, and power WC for mobility needs. Pt needs heavy physical assistance with ADL 2/2 BUE contractures and motor dysfunction   PRECAUTIONS: Fall  SUBJECTIVE: Patient reports he had a good weekend and feeling pretty good today.   Patient reports PAIN:  Are you having pain? No   TODAY'S TREATMENT:     Therex:    (patient in power wheelchair today)      Reassessed goals for recert visit. See goal section for specific details.  Education provided throughout session via VC/TC and demonstration to facilitate movement at target joints and co rrect muscle activation for all testing and exercises performed.       PATIENT EDUCATION: Education details: posture cues with sitting Person educated: Patient Education method: Explanation, Demonstration, Tactile cues, and Verbal cues Education comprehension: verbalized understanding, returned demonstration, verbal cues required, tactile cues required, and needs further education   HOME EXERCISE PROGRAM:    PT Short Term Goals -       PT SHORT TERM GOAL #1   Title Pt will be independent with HEP in order to improve strength and balance in order to decrease fall risk and improve function at home and work.    Baseline 01/04/2021= No formal HEP in place; 12/12 no HEP in place; 05/10/2021-Patient and his father were able to  report compliance with curent HEP consisting of mostly seated/reclined LE strengthening. Both verbalize no questions at this time.    Time 6    Period Weeks    Status Achieved    Target Date 02/15/21              PT Long Term Goals -      PT LONG TERM GOAL #1   Title Patient will increase BLE gross strength by 1/2 muscle grade to improve functional strength for improved  independence with potential gait, increased standing tolerance and increased ADL ability.    Baseline 01/04/2021- Patient presents with 1/5 to 3-/5 B LE strength with MMT; 12/12: goal partially met for Left knee/hip; 05/10/2021= 2-/5 bilateal Hip flex; 3+/5 bilateral Knee ext; 06/21/2021= Patient presents with 2-/5 bilateral Hip flex; 3+/5 bilateral knee ext/flex; 2-/5 left ankle DF; 0/5 right ankle- and able to increase reps and resistance with LE's. 09/15/2021- Patient technically presents with 2-/5 B hip flex/abd/add - but he is able to raise his hip up to approx 100 deg which has improved. 3+/5 Bilateral knee ext, 2-/5 left ankle and 0/5 right ankle. 12/08/2021= Patient able to lift left knee at 110 deg of hip flex; presents with 3+/5 knee ext, 2-/5 left ankle DF and 0/5 right ankle DF, 2-/5 bilateral Hip abd in seated position.    Time 12    Period Weeks    Status On-going    Target Date 03/07/2022     PT LONG TERM GOAL #2   Title Patient will tolerate sitting unsupported demonstrating erect sitting posture for 15 minutes with CGA to demonstrate improved back extensor strength and improved sitting tolerance.    Baseline 01/04/2021- Patient confied to sitting in lift chair or electric power chair with back support and unable to sit upright without physical assistance; 12/12: tolerates <1 minutes upright unsupported sitting. 05/10/2021=static sit with forward trunk lean  in his power wheelchair without back support x approx 3 min. 06/21/2021=Unable to assess today due to patient with acute back pain but on previous visit able to sit x 8 min without back support. 09/15/2021- on last visit- 09/13/2021- patient was able to sit unsupported x 8 min at edge of mat. 10/13/2023 - Patient was able to sit at edge of mat with varying level of assist today from SBA to min A for a total of 20 min. 12/13/2021= Patient demonstrated unsupported sitting at edge of mat for approx 20 min   Time 12    Period Weeks    Status GOAL MET    Target Date 12/08/21      PT LONG TERM GOAL #3   Title Patient will demonstrate ability to perform static standing in // bars > 2 min with Max Assist  without loss of balance and fair posture for improved overall strength for pre-gait and transfer activities.    Baseline 01/04/2021= Patient current uanble to stand- Dependent on hoyer or sit to stand lift for transfers. 05/10/2021=Not appropriate yet- Currently still dependent with all transfers using hoyer. 06/21/2021= Patient continuing now to focus on LE strengthening to prepare for standing-unable to try today due to acute low back pain-  planning on attempting in new cert period. 09/15/2021- Patient has attempted standing 2x in past two week- max Assist of 2 people - only once was he successful to clearing his bottom from chair - Will continue to be a focus during the new certification. 12/13/2021= Patient has been limited secondary to increased overall low back pain  during this certification and will require more time to focus on this goal.    Time 12    Period Weeks    Status On-going    Target Date 03/07/2022     PT LONG TERM GOAL #4   Title Pt will improve FOTO score by 10 points or more demonstrating improved perceived functional ability    Baseline FOTO 7 on 10/17; 03/15/21: FOTO 12; 05/10/2021 06/21/2021= 1; 09/15/2021= 9; 12/13/2021= Will issue next visit    Time 12    Period Weeks    Status On-going    Target Date 03/07/2022     PT LONG TERM GOAL #5   Title Patient will perform sit to stand transfer with appropriate AD and max assist of 2 people with 75% consistency to prepare for pregait activities.    Baseline 09/15/2021= Patient unable to stand well- unable to clear his bottom off chair with Max assist of 2 persons. 12/13/2021- Goal not appropriate to try yet but will keep and roll over to next cert as shift continues to focus on transfers/standing   Time 12    Period Weeks    Status New    Target Date 03/07/2022   PT LONG TERM GOAL #6   Title Patient will tolerate sitting unsupported demonstrating erect sitting posture for 30 minutes with CGA to demonstrate improved back extensor strength and improved sitting tolerance.   Baseline 12/13/2021= Patient demonstrated unsupported sitting at edge of mat for approx 20 min;  12/29/2021- Patient performed approx 30 min of dynamic sitting activities today.   Time 12   Period Weeks   Status ONGOING  Target Date 03/07/2022            Plan     Clinical Impression Statement Patient presented with good motivation for strengthening despite stating not feeling well enough to attempt to stand today. He responded well to verbal cues and instruction for best technique. He continues to present with progress with Right LE strength as seen by ability to add some ankle weight resistance to right LE and able to ext knee with additional resistance to possibly assist with future weight bearing/standing activities.  Patient will benefit from additional skilled PT intervention to improve strength, balance, and mobility for improved quality of life.    Personal Factors and Comorbidities Comorbidity 3+;Time since onset of injury/illness/exacerbation    Comorbidities CVA, diabetes, Seizures    Examination-Activity Limitations Bathing;Bed Mobility;Bend;Caring for Others;Carry;Dressing;Hygiene/Grooming;Lift;Locomotion Level;Reach Overhead;Self Feeding;Sit;Squat;Stairs;Stand;Transfers;Toileting    Examination-Participation Restrictions Cleaning;Community Activity;Driving;Laundry;Medication Management;Meal Prep;Occupation;Personal Finances;Shop;Yard Work;Volunteer    Stability/Clinical Decision Making Evolving/Moderate complexity    Rehab Potential Fair    PT Frequency 2x / week    PT Duration 12 weeks    PT Treatment/Interventions ADLs/Self Care Home Management;Cryotherapy;Electrical Stimulation;Moist Heat;Ultrasound;DME Instruction;Gait training;Stair training;Functional mobility training;Therapeutic  exercise;Balance training;Patient/family education;Orthotic Fit/Training;Neuromuscular re-education;Wheelchair mobility training;Manual techniques;Passive range of motion;Dry needling;Energy conservation;Taping;Visual/perceptual remediation/compensation;Joint Manipulations    PT Next Visit Plan core strength- sitting at EOM, postural control, sabina sit to stand if able; Continue with progressive LE Strengthening    PT Home Exercise Plan No changes to HEP today    Consulted and Agree with Plan of Care Patient;Family member/caregiver    Family Member Consulted Dad               Lewis Moccasin, PT 03/07/2022, 8:17 AM

## 2022-03-09 ENCOUNTER — Ambulatory Visit: Payer: BC Managed Care – PPO | Attending: Physician Assistant | Admitting: Physical Therapy

## 2022-03-09 ENCOUNTER — Encounter: Payer: Self-pay | Admitting: Physical Therapy

## 2022-03-09 DIAGNOSIS — I6930 Unspecified sequelae of cerebral infarction: Secondary | ICD-10-CM | POA: Diagnosis not present

## 2022-03-09 DIAGNOSIS — M6281 Muscle weakness (generalized): Secondary | ICD-10-CM | POA: Insufficient documentation

## 2022-03-09 DIAGNOSIS — R269 Unspecified abnormalities of gait and mobility: Secondary | ICD-10-CM | POA: Diagnosis not present

## 2022-03-09 DIAGNOSIS — H543 Unqualified visual loss, both eyes: Secondary | ICD-10-CM | POA: Diagnosis not present

## 2022-03-09 DIAGNOSIS — R278 Other lack of coordination: Secondary | ICD-10-CM | POA: Diagnosis not present

## 2022-03-09 DIAGNOSIS — R262 Difficulty in walking, not elsewhere classified: Secondary | ICD-10-CM | POA: Diagnosis not present

## 2022-03-09 DIAGNOSIS — R2689 Other abnormalities of gait and mobility: Secondary | ICD-10-CM | POA: Diagnosis not present

## 2022-03-09 DIAGNOSIS — R2681 Unsteadiness on feet: Secondary | ICD-10-CM | POA: Diagnosis not present

## 2022-03-09 NOTE — Therapy (Signed)
OUTPATIENT PHYSICAL THERAPY TREATMENT NOTE / [place]  Patient Name: Paul Hall MRN: [id number] DOB:Sep 13, 1995, 26 y.o., male Today's Date: 03/09/2022  PCP:  Loistine Chance PROVIDER:  Collene Mares, PA-C   PT End of Session - 03/09/22 9458     Visit Number 75    Number of Visits 81    Date for PT Re-Evaluation 06/01/22    Authorization Type [employer] and [employer]- Need auth past 12th visit    Authorization Time Period 01/04/21-03/29/21; Recert 59/29/2446-2/86/3817; Recert 10/12/6577- 0/06/8331; Recert 8/32/9191-66/0/6004    Progress Note Due on Visit 80    PT Start Time 1516    PT Stop Time 1559    PT Time Calculation (min) 43 min    Equipment Utilized During Treatment Gait belt   Hoyer lift   Activity Tolerance Patient tolerated treatment well   queasiness/nausea   Behavior During Therapy WFL for tasks assessed/performed                   Past Medical History:  Diagnosis Date   Diabetes mellitus ([place])    Hypertension    Stroke ([place])    Past Surgical History:  Procedure Laterality Date   IVC FILTER PLACEMENT ([place] HX)     LEG SURGERY     PEG TUBE PLACEMENT     TRACHEOSTOMY     Patient Active Problem List   Diagnosis Date Noted   Sepsis due to vancomycin resistant Enterococcus species ([place]) 06/06/2019   SIRS (systemic inflammatory response syndrome) ([place]) 06/05/2019   Acute lower UTI 06/05/2019   VRE (vancomycin-resistant Enterococci) infection 06/05/2019   Anemia 06/05/2019   Skin ulcer of sacrum with necrosis of muscle ([place])    Urinary retention    Type 2 diabetes mellitus without complication, with long-term current use of insulin ([facility])    Tachycardia    Lower extremity edema    Acute metabolic encephalopathy    Obstructive sleep apnea    Morbid obesity with BMI of 60.0-69.9, adult ([place])    Goals of care, counseling/discussion    Palliative care encounter    Sepsis ([place]) 04/27/2019   H/O insulin dependent diabetes mellitus 04/27/2019    History of CVA with residual deficit 04/27/2019   Seizure disorder ([place]) 04/27/2019   Decubitus ulcer of sacral region, stage 4 ([place]) 04/27/2019    REFERRING DIAG: Cerebral infarction, unspecified   THERAPY DIAG:  Muscle weakness (generalized) - Plan: PT plan of care cert/re-cert  Difficulty in walking, not elsewhere classified - Plan: PT plan of care cert/re-cert  Rationale for Evaluation and Treatment Rehabilitation  PERTINENT HISTORY: [name] is a 25yoM who presents with severe weakness, quadriparesis, altered sensorium, and visual impairment s/p critical illness and prolonged hospitalization. Pt hospitalized in October 2020 with ARDS 2/2 COVID19 infection. Pt sustained a complex and lengthy hospitalization which included tracheostomy, prolonged sedation, ECMO. In this period pt sustained CVA and SDH. Pt has now been liberated from tracheostomy and G-tube. Pt has since been hospitalized for wound infection and UTI. Pt lives with parents at home, has hospital bed and left chair, hoyer lift transfers, and power WC for mobility needs. Pt needs heavy physical assistance with ADL 2/2 BUE contractures and motor dysfunction   PRECAUTIONS: Fall  SUBJECTIVE: Pt reports he has been improving with his sitting balance and endurance. He reports little stuff like being able to get more motion out of his Les. Pt reports he has been using the STS lift 2-3 times per week pt reports he is  able to do 3-4 stands from the chair using STS lift for 5 minutes total duration.   Patient reports PAIN:  Are you having pain? No   TODAY'S TREATMENT:     Therex:    Physical therapy treatment session today consisted of completing assessment of goals and administration of testing as demonstrated and documented in flow sheet, treatment, and goals section of this note. Addition treatments may be found below.     Seated hip add with manual resistance- 2*10 reps  Seated hip abd with manual resistance 2*10 reps     Seated ham curl  GTB on L and grey TB on R 2 x 10 ea LE   Seated glute set with manual resistance from PT 2 x 10   Education provided throughout session via VC/TC and demonstration to facilitate movement at target joints and co rrect muscle activation for all testing and exercises performed.       PATIENT EDUCATION: Education details: posture cues with sitting Person educated: Patient Education method: Explanation, Demonstration, Tactile cues, and Verbal cues Education comprehension: verbalized understanding, returned demonstration, verbal cues required, tactile cues required, and needs further education   HOME EXERCISE PROGRAM:    PT Short Term Goals -       PT SHORT TERM GOAL #1   Title Pt will be independent with HEP in order to improve strength and balance in order to decrease fall risk and improve function at home and work.    Baseline 01/04/2021= No formal HEP in place; 12/12 no HEP in place; 05/10/2021-Patient and his father were able to report compliance with curent HEP consisting of mostly seated/reclined LE strengthening. Both verbalize no questions at this time.    Time 6    Period Weeks    Status Achieved    Target Date 02/15/21              PT Long Term Goals - Date for completion: 06/01/2022         PT LONG TERM GOAL #1   Title Patient will increase BLE gross strength by 1/2 muscle grade to improve functional strength for improved independence with potential gait, increased standing tolerance and increased ADL ability.    Baseline 01/04/2021- Patient presents with 1/5 to 3-/5 B LE strength with MMT; 12/12: goal partially met for Left knee/hip; 05/10/2021= 2-/5 bilateal Hip flex; 3+/5 bilateral Knee ext; 06/21/2021= Patient presents with 2-/5 bilateral Hip flex; 3+/5 bilateral knee ext/flex; 2-/5 left ankle DF; 0/5 right ankle- and able to increase reps and resistance with LE's. 09/15/2021- Patient technically presents with 2-/5 B hip flex/abd/add - but he is able  to raise his hip up to approx 100 deg which has improved. 3+/5 Bilateral knee ext, 2-/5 left ankle and 0/5 right ankle.  12/08/2021= Patient able to lift left knee at 110 deg of hip flex; presents with 3+/5 knee ext, 2-/5 left ankle DF and 0/5 right ankle DF, 2-/5 bilateral Hip abd in seated position.   12/6: R: knee 3+/5 ext, 2/5 flexion, left knee 3+/5 extension, 3+/5 flexion, R hip: 2+/5 hip add, 2+/5 hip ABD L hip: 4-/5 hip ABD, 3+/5 hip ADD, 3+/5 hip flexion    Time 12    Period Weeks    Status Partially met, met with exception of ankle musculature on the R LE    Target Date 03/07/2022     PT LONG TERM GOAL #2   Title Patient will tolerate sitting unsupported demonstrating erect sitting posture for 15 minutes   with CGA to demonstrate improved back extensor strength and improved sitting tolerance.    Baseline 01/04/2021- Patient confied to sitting in lift chair or electric power chair with back support and unable to sit upright without physical assistance; 12/12: tolerates <1 minutes upright unsupported sitting. 05/10/2021=static sit with forward trunk lean  in his power wheelchair without back support x approx 3 min. 06/21/2021=Unable to assess today due to patient with acute back pain but on previous visit able to sit x 8 min without back support. 09/15/2021- on last visit- 09/13/2021- patient was able to sit unsupported x 8 min at edge of mat. 10/13/2023 - Patient was able to sit at edge of mat with varying level of assist today from SBA to min A for a total of 20 min. 12/13/2021= Patient demonstrated unsupported sitting at edge of mat for approx 20 min   Time 12    Period Weeks    Status GOAL MET   Target Date 12/08/21      PT LONG TERM GOAL #3   Title Patient will demonstrate ability to perform static standing in // bars > 2 min with Max Assist  without loss of balance and fair posture for improved overall strength for pre-gait and transfer activities.    Baseline 01/04/2021= Patient current uanble to  stand- Dependent on hoyer or sit to stand lift for transfers. 05/10/2021=Not appropriate yet- Currently still dependent with all transfers using hoyer. 06/21/2021= Patient continuing now to focus on LE strengthening to prepare for standing-unable to try today due to acute low back pain-  planning on attempting in new cert period. 09/15/2021- Patient has attempted standing 2x in past two week- max Assist of 2 people - only once was he successful to clearing his bottom from chair - Will continue to be a focus during the new certification. 12/13/2021= Patient has been limited secondary to increased overall low back pain during this certification and will require more time to focus on this goal.  12/6: not assessed this date, will assess at date when 2-3 PTs are present for assistance    Time 12    Period Weeks    Status On-going          PT LONG TERM GOAL #4   Title Pt will improve FOTO score by 10 points or more demonstrating improved perceived functional ability    Baseline FOTO 7 on 10/17; 03/15/21: FOTO 12; 05/10/2021 06/21/2021= 1; 09/15/2021= 9; 12/13/2021= Will issue next visit 12/6: 4   Time 12    Period Weeks    Status Not Met          PT LONG TERM GOAL #5   Title Patient will perform sit to stand transfer with appropriate AD and max assist of 2 people with 75% consistency to prepare for pregait activities.    Baseline 09/15/2021= Patient unable to stand well- unable to clear his bottom off chair with Max assist of 2 persons. 12/13/2021- Goal not appropriate to try yet but will keep and roll over to next cert as shift continues to focus on transfers/standing   Time 12    Period Weeks    Status New        PT LONG TERM GOAL #6  Title Patient will tolerate sitting unsupported demonstrating erect sitting posture for 30 minutes with CGA to demonstrate improved back extensor strength and improved sitting tolerance.   Baseline 12/13/2021= Patient demonstrated unsupported sitting at edge of mat for approx  20 min;  12/29/2021- Patient   performed approx 30 min of dynamic sitting activities today. 12/6:   Time 12   Period Weeks   Status ONGOING         7.  Patient will tolerate 5 minutes or more of standing in sit to stand lift in order to indicate improved lower extremity weightbearing tolerance for progression to standing in parallel bars. Baseline: 1 minute on most recent stand 02/21/22 Goal status: INITIAL       Plan     Clinical Impression Statement Patient presents to physical therapy per recertification note this date.  Patient demonstrates progress with his lower extremity leg strength as evidenced by manual muscle testing and goals section of this exam.  Patient has been made improvements in his sit to stand tolerance utilizing sit to stand lift at home he needs to make progress with this in physical therapy sessions.  Patient continues to be very motivated and consistent with his home exercise program which is allowed him to improve his lower extremity strength.  Patient encouraged to perform mental practice of sit to stands in parallel bars with 2-3 physical therapist assisting him so he is more comfortable with attempting this task in future sessions as his lower extremity strength continues to progress.  Patient's focus on therapeutic outcome score continues to remain low with this is secondary to inappropriateness at this survey for patient's particular functional status as most of the questions are related to walking and standing.  Patient has new goal of standing tolerance and sit to stand standard in order to indicate improved lower extremity weightbearing tolerance for progression to standing in parallel bars. Patient's condition has the potential to improve in response to therapy. Maximum improvement is yet to be obtained. The anticipated improvement is attainable and reasonable in a generally predictable time.  Pt will continue to benefit from skilled physical therapy intervention to  address impairments, improve QOL, and attain therapy goals.     Personal Factors and Comorbidities Comorbidity 3+;Time since onset of injury/illness/exacerbation    Comorbidities CVA, diabetes, Seizures    Examination-Activity Limitations Bathing;Bed Mobility;Bend;Caring for Others;Carry;Dressing;Hygiene/Grooming;Lift;Locomotion Level;Reach Overhead;Self Feeding;Sit;Squat;Stairs;Stand;Transfers;Toileting    Examination-Participation Restrictions Cleaning;Community Activity;Driving;Laundry;Medication Management;Meal Prep;Occupation;Personal Finances;Shop;Yard Work;Volunteer    Stability/Clinical Decision Making Evolving/Moderate complexity    Rehab Potential Fair    PT Frequency 2x / week    PT Duration 12 weeks    PT Treatment/Interventions ADLs/Self Care Home Management;Cryotherapy;Electrical Stimulation;Moist Heat;Ultrasound;DME Instruction;Gait training;Stair training;Functional mobility training;Therapeutic exercise;Balance training;Patient/family education;Orthotic Fit/Training;Neuromuscular re-education;Wheelchair mobility training;Manual techniques;Passive range of motion;Dry needling;Energy conservation;Taping;Visual/perceptual remediation/compensation;Joint Manipulations    PT Next Visit Plan core strength- sitting at EOM, postural control, sabina sit to stand if able; Continue with progressive LE Strengthening    PT Home Exercise Plan No changes to HEP today    Consulted and Agree with Plan of Care Patient;Family member/caregiver    Family Member Consulted Dad               Particia Lather, [place] 03/09/2022, 5:13 PM

## 2022-03-14 ENCOUNTER — Ambulatory Visit: Payer: BC Managed Care – PPO | Admitting: Occupational Therapy

## 2022-03-14 ENCOUNTER — Ambulatory Visit: Payer: BC Managed Care – PPO

## 2022-03-14 DIAGNOSIS — H543 Unqualified visual loss, both eyes: Secondary | ICD-10-CM | POA: Diagnosis not present

## 2022-03-14 DIAGNOSIS — R278 Other lack of coordination: Secondary | ICD-10-CM

## 2022-03-14 DIAGNOSIS — I6930 Unspecified sequelae of cerebral infarction: Secondary | ICD-10-CM

## 2022-03-14 DIAGNOSIS — M6281 Muscle weakness (generalized): Secondary | ICD-10-CM | POA: Diagnosis not present

## 2022-03-14 DIAGNOSIS — R269 Unspecified abnormalities of gait and mobility: Secondary | ICD-10-CM

## 2022-03-14 DIAGNOSIS — R262 Difficulty in walking, not elsewhere classified: Secondary | ICD-10-CM | POA: Diagnosis not present

## 2022-03-14 DIAGNOSIS — R2689 Other abnormalities of gait and mobility: Secondary | ICD-10-CM

## 2022-03-14 DIAGNOSIS — R2681 Unsteadiness on feet: Secondary | ICD-10-CM

## 2022-03-14 NOTE — Therapy (Signed)
OUTPATIENT PHYSICAL THERAPY TREATMENT NOTE   Patient Name: Paul Hall MRN: [id number] DOB:01/29/1996, 25 y.o., male Today's Date: 03/15/2022  PCP:  REFERRING PROVIDER:  Benjamin Mann, PA-C   PT End of Session - 03/14/22 1513     Visit Number 76    Number of Visits 81    Date for PT Re-Evaluation 06/01/22    Authorization Type [employer] and [employer]- Need auth past 12th visit    Authorization Time Period 01/04/21-03/29/21; Recert 03/24/2021-06/16/2021; Recert 09/15/2021- 12/08/2021; Recert 12/13/2021-03/07/2022    Progress Note Due on Visit 80    PT Start Time 1517    PT Stop Time 1600    PT Time Calculation (min) 43 min    Equipment Utilized During Treatment Gait belt   Hoyer lift   Activity Tolerance Patient tolerated treatment well   queasiness/nausea   Behavior During Therapy WFL for tasks assessed/performed                    Past Medical History:  Diagnosis Date   Diabetes mellitus (HCC)    Hypertension    Stroke (HCC)    Past Surgical History:  Procedure Laterality Date   IVC FILTER PLACEMENT ([facility] HX)     LEG SURGERY     PEG TUBE PLACEMENT     TRACHEOSTOMY     Patient Active Problem List   Diagnosis Date Noted   Sepsis due to vancomycin resistant Enterococcus species (HCC) 06/06/2019   SIRS (systemic inflammatory response syndrome) (HCC) 06/05/2019   Acute lower UTI 06/05/2019   VRE (vancomycin-resistant Enterococci) infection 06/05/2019   Anemia 06/05/2019   Skin ulcer of sacrum with necrosis of muscle (HCC)    Urinary retention    Type 2 diabetes mellitus without complication, with long-term current use of insulin (HCC)    Tachycardia    Lower extremity edema    Acute metabolic encephalopathy    Obstructive sleep apnea    Morbid obesity with BMI of 60.0-69.9, adult (HCC)    Goals of care, counseling/discussion    Palliative care encounter    Sepsis (HCC) 04/27/2019   H/O insulin dependent diabetes mellitus 04/27/2019    History of CVA with residual deficit 04/27/2019   Seizure disorder (HCC) 04/27/2019   Decubitus ulcer of sacral region, stage 4 (HCC) 04/27/2019    REFERRING DIAG: Cerebral infarction, unspecified   THERAPY DIAG:  Muscle weakness (generalized)  Difficulty in walking, not elsewhere classified  Other lack of coordination  History of CVA with residual deficit  Unsteadiness on feet  Abnormality of gait and mobility  Other abnormalities of gait and mobility  Rationale for Evaluation and Treatment Rehabilitation  PERTINENT HISTORY: Keena Stamps is a 25yoM who presents with severe weakness, quadriparesis, altered sensorium, and visual impairment s/p critical illness and prolonged hospitalization. Pt hospitalized in October 2020 with ARDS 2/2 COVID19 infection. Pt sustained a complex and lengthy hospitalization which included tracheostomy, prolonged sedation, ECMO. In this period pt sustained CVA and SDH. Pt has now been liberated from tracheostomy and G-tube. Pt has since been hospitalized for wound infection and UTI. Pt lives with parents at home, has hospital bed and left chair, hoyer lift transfers, and power WC for mobility needs. Pt needs heavy physical assistance with ADL 2/2 BUE contractures and motor dysfunction   PRECAUTIONS: Fall  SUBJECTIVE: Pt reports he is having more issues with stomach today and not feeling great.   Patient reports PAIN:  Are you having pain? No   TODAY'S TREATMENT:       Therex:       Seated hip add with manual resistance- 2*10 reps  Seated hip abd with manual resistance 2*10 reps      Pyramid LE Strengthening knee ext 2lb x 12 reps each LE 2.5 x 10 reps each LE 3 # x 8 reps each LE 4# x 6 reps each LE  AAROM with BLE - hip flexion- x 12 reps   Education provided throughout session via VC/TC and demonstration to facilitate movement at target joints and co rrect muscle activation for all testing and exercises performed.       PATIENT  EDUCATION: Education details: posture cues with sitting Person educated: Patient Education method: Explanation, Demonstration, Tactile cues, and Verbal cues Education comprehension: verbalized understanding, returned demonstration, verbal cues required, tactile cues required, and needs further education   HOME EXERCISE PROGRAM:    PT Short Term Goals -       PT SHORT TERM GOAL #1   Title Pt will be independent with HEP in order to improve strength and balance in order to decrease fall risk and improve function at home and work.    Baseline 01/04/2021= No formal HEP in place; 12/12 no HEP in place; 05/10/2021-Patient and his father were able to report compliance with curent HEP consisting of mostly seated/reclined LE strengthening. Both verbalize no questions at this time.    Time 6    Period Weeks    Status Achieved    Target Date 02/15/21              PT Long Term Goals - Date for completion: 06/01/2022         PT LONG TERM GOAL #1   Title Patient will increase BLE gross strength by 1/2 muscle grade to improve functional strength for improved independence with potential gait, increased standing tolerance and increased ADL ability.    Baseline 01/04/2021- Patient presents with 1/5 to 3-/5 B LE strength with MMT; 12/12: goal partially met for Left knee/hip; 05/10/2021= 2-/5 bilateal Hip flex; 3+/5 bilateral Knee ext; 06/21/2021= Patient presents with 2-/5 bilateral Hip flex; 3+/5 bilateral knee ext/flex; 2-/5 left ankle DF; 0/5 right ankle- and able to increase reps and resistance with LE's. 09/15/2021- Patient technically presents with 2-/5 B hip flex/abd/add - but he is able to raise his hip up to approx 100 deg which has improved. 3+/5 Bilateral knee ext, 2-/5 left ankle and 0/5 right ankle.  12/08/2021= Patient able to lift left knee at 110 deg of hip flex; presents with 3+/5 knee ext, 2-/5 left ankle DF and 0/5 right ankle DF, 2-/5 bilateral Hip abd in seated position.   12/6: R: knee  3+/5 ext, 2/5 flexion, left knee 3+/5 extension, 3+/5 flexion, R hip: 2+/5 hip add, 2+/5 hip ABD L hip: 4-/5 hip ABD, 3+/5 hip ADD, 3+/5 hip flexion    Time 12    Period Weeks    Status Partially met, met with exception of ankle musculature on the R LE    Target Date 03/07/2022     PT LONG TERM GOAL #2   Title Patient will tolerate sitting unsupported demonstrating erect sitting posture for 15 minutes with CGA to demonstrate improved back extensor strength and improved sitting tolerance.    Baseline 01/04/2021- Patient confied to sitting in lift chair or electric power chair with back support and unable to sit upright without physical assistance; 12/12: tolerates <1 minutes upright unsupported sitting. 05/10/2021=static sit with forward trunk lean  in his power wheelchair without back   support x approx 3 min. 06/21/2021=Unable to assess today due to patient with acute back pain but on previous visit able to sit x 8 min without back support. 09/15/2021- on last visit- 09/13/2021- patient was able to sit unsupported x 8 min at edge of mat. 10/13/2023 - Patient was able to sit at edge of mat with varying level of assist today from SBA to min A for a total of 20 min. 12/13/2021= Patient demonstrated unsupported sitting at edge of mat for approx 20 min   Time 12    Period Weeks    Status GOAL MET   Target Date 12/08/21      PT LONG TERM GOAL #3   Title Patient will demonstrate ability to perform static standing in // bars > 2 min with Max Assist  without loss of balance and fair posture for improved overall strength for pre-gait and transfer activities.    Baseline 01/04/2021= Patient current uanble to stand- Dependent on hoyer or sit to stand lift for transfers. 05/10/2021=Not appropriate yet- Currently still dependent with all transfers using hoyer. 06/21/2021= Patient continuing now to focus on LE strengthening to prepare for standing-unable to try today due to acute low back pain-  planning on attempting in new  cert period. 09/15/2021- Patient has attempted standing 2x in past two week- max Assist of 2 people - only once was he successful to clearing his bottom from chair - Will continue to be a focus during the new certification. 12/13/2021= Patient has been limited secondary to increased overall low back pain during this certification and will require more time to focus on this goal.  12/6: not assessed this date, will assess at date when 2-3 PTs are present for assistance    Time 12    Period Weeks    Status On-going          PT LONG TERM GOAL #4   Title Pt will improve FOTO score by 10 points or more demonstrating improved perceived functional ability    Baseline FOTO 7 on 10/17; 03/15/21: FOTO 12; 05/10/2021 06/21/2021= 1; 09/15/2021= 9; 12/13/2021= Will issue next visit 12/6: 4   Time 12    Period Weeks    Status Not Met          PT LONG TERM GOAL #5   Title Patient will perform sit to stand transfer with appropriate AD and max assist of 2 people with 75% consistency to prepare for pregait activities.    Baseline 09/15/2021= Patient unable to stand well- unable to clear his bottom off chair with Max assist of 2 persons. 12/13/2021- Goal not appropriate to try yet but will keep and roll over to next cert as shift continues to focus on transfers/standing   Time 12    Period Weeks    Status New        PT LONG TERM GOAL #6  Title Patient will tolerate sitting unsupported demonstrating erect sitting posture for 30 minutes with CGA to demonstrate improved back extensor strength and improved sitting tolerance.   Baseline 12/13/2021= Patient demonstrated unsupported sitting at edge of mat for approx 20 min;  12/29/2021- Patient performed approx 30 min of dynamic sitting activities today. 12/6:   Time 12   Period Weeks   Status ONGOING         7.  Patient will tolerate 5 minutes or more of standing in sit to stand lift in order to indicate improved lower extremity weightbearing tolerance for progression  to standing   in parallel bars. Baseline: 1 minute on most recent stand 02/21/22 Goal status: INITIAL       Plan     Clinical Impression Statement Treatment limited to LE strengthening in chair as patient continues to have some complaints of stomach issues. He continues to progress overall with Right LE strength as seen by increased resistance with pyramid designed strengthening activities.   Pt will continue to benefit from skilled physical therapy intervention to address impairments, improve QOL, and attain therapy goals.     Personal Factors and Comorbidities Comorbidity 3+;Time since onset of injury/illness/exacerbation    Comorbidities CVA, diabetes, Seizures    Examination-Activity Limitations Bathing;Bed Mobility;Bend;Caring for Others;Carry;Dressing;Hygiene/Grooming;Lift;Locomotion Level;Reach Overhead;Self Feeding;Sit;Squat;Stairs;Stand;Transfers;Toileting    Examination-Participation Restrictions Cleaning;Community Activity;Driving;Laundry;Medication Management;Meal Prep;Occupation;Personal Finances;Shop;Yard Work;Volunteer    Stability/Clinical Decision Making Evolving/Moderate complexity    Rehab Potential Fair    PT Frequency 2x / week    PT Duration 12 weeks    PT Treatment/Interventions ADLs/Self Care Home Management;Cryotherapy;Electrical Stimulation;Moist Heat;Ultrasound;DME Instruction;Gait training;Stair training;Functional mobility training;Therapeutic exercise;Balance training;Patient/family education;Orthotic Fit/Training;Neuromuscular re-education;Wheelchair mobility training;Manual techniques;Passive range of motion;Dry needling;Energy conservation;Taping;Visual/perceptual remediation/compensation;Joint Manipulations    PT Next Visit Plan core strength- sitting at EOM, postural control, sabina sit to stand if able; Continue with progressive LE Strengthening    PT Home Exercise Plan No changes to HEP today    Consulted and Agree with Plan of Care Patient;Family  member/caregiver    Family Member Consulted Dad               Marion Rosenberry N Mackenzie Lia, PT 03/15/2022, 12:44 PM     

## 2022-03-14 NOTE — Therapy (Addendum)
OUTPATIENT OCCUPATIONAL THERAPY TREATMENT  Patient Name: Paul Hall MRN: [id number] DOB:[birth year], 26 y.o., male Today's Date: 03/14/2022  PCP: Dr. Sherwood Gambler REFERRING PROVIDER: Dr. Sherwood Gambler   OT End of Session - 03/14/22 1536     Visit Number 8    Number of Visits 12    Date for OT Re-Evaluation 03/14/2022   Authorization Type Progress report period starting 10/06/2021    OT Start Time 1447    OT Stop Time 1515    OT Time Calculation (min) 28 min    Activity Tolerance Patient tolerated treatment well    Behavior During Therapy WFL for tasks assessed/performed                 Past Medical History:  Diagnosis Date   Diabetes mellitus (HCC)    Hypertension    Stroke [facility])    Past Surgical History:  Procedure Laterality Date   IVC FILTER PLACEMENT ([facility] HX)     LEG SURGERY     PEG TUBE PLACEMENT     TRACHEOSTOMY     Patient Active Problem List   Diagnosis Date Noted   Sepsis due to vancomycin resistant Enterococcus species (HCC) 06/06/2019   SIRS (systemic inflammatory response syndrome) (HCC) 06/05/2019   Acute lower UTI 06/05/2019   VRE (vancomycin-resistant Enterococci) infection 06/05/2019   Anemia 06/05/2019   Skin ulcer of sacrum with necrosis of muscle (HCC)    Urinary retention    Type 2 diabetes mellitus without complication, with long-term current use of insulin (HCC)    Tachycardia    Lower extremity edema    Acute metabolic encephalopathy    Obstructive sleep apnea    Morbid obesity with BMI of 60.0-69.9, adult (HCC)    Goals of care, counseling/discussion    Palliative care encounter    Sepsis (HCC) 04/27/2019   H/O insulin dependent diabetes mellitus 04/27/2019   History of CVA with residual deficit 04/27/2019   Seizure disorder (HCC) 04/27/2019   Decubitus ulcer of sacral region, stage 4 (HCC) 04/27/2019    ONSET DATE: 01/2019  REFERRING DIAG: CVA/COVID-19  THERAPY DIAG:  Muscle weakness (generalized)  Other lack of  coordination  Rationale for Evaluation and Treatment Rehabilitation  SUBJECTIVE:   Patient reports that transportation was late picking him up this afternoon.  SUBJECTIVE STATEMENT:  Pt. reports having to use a different transportation service temporarily. Pt accompanied by: self and family member  PERTINENT HISTORY:  Pt. is a 26 y.o. male who was diagnosed with COVID-19, and CVA with resultant quadriplegia. Pt. Was then hospitalized with VRE UTI. PMHx includes: urinary retention, seizure disorder, obstructive sleep disorder, DM Type II, Morbid obesity.   PRECAUTIONS: None  WEIGHT BEARING RESTRICTIONS No  PAIN:  Are you having pain? No  FALLS: Has patient fallen in last 6 months? No  LIVING ENVIRONMENT: Lives with: lives with their family Lives in: House/apartment Stairs: No Level Entry Has following equipment at home: Wheelchair (power) and hoyer lift, sit to stand lift  PLOF: Independent  PATIENT GOALS: To be able to engage in more daily care tasks.  OBJECTIVE:   HAND DOMINANCE: Right  ADLs:  Transfers/ambulation related to ADLs: Eating: Pt. Reports being able to hold standard utensils, and is starting to engage more in self-feeding tasks, hand to mouth patterns. Pt. Reports that he does as much as he can with the task, and family assists  with the remainder of the task. Grooming: Pt. Is able to initiate holding an electric toothbrush, and brush his  teeth. Family assists LB Dressing: Total Assist UE dressing: Pt. is now able to reach up to actively assist with grasping , and pulling his gown down. Toileting:  Total Assist Bathing: MaxA UB, Total assist LB Tub Shower transfers: N/A Equipment: See above    IADLs: Shopping: Relies on family to assist Light housekeeping: Total Assist Meal Prep: Total Assist Community mobility:   Medication management:  Total Assist  Financial management: N/A Handwriting: Not legible: Pt. Is able to hold a pen with the left  hand, and initiate marking the page. Pt.'s eye glasses were not available  MOBILITY STATUS:  Power w/c  POSTURE COMMENTS:  Pt. Requires position changes in his power w/c  ACTIVITY TOLERANCE: Activity tolerance:  Fair  FUNCTIONAL OUTCOME MEASURES: FOTO: 40  TR score: 45  UPPER EXTREMITY ROM     Active ROM Right eval Left eval  Shoulder flexion 106 scaption 119  Shoulder abduction 114 110  Shoulder adduction    Shoulder extension    Shoulder internal rotation    Shoulder external rotation    Elbow flexion 120(130) 135(135)  Elbow extension -45(-35) -27(-18)  Wrist flexion    Wrist extension -30(10) 10(50  Wrist ulnar deviation    Wrist radial deviation    Wrist pronation    Wrist supination    (Blank rows = not tested)   UPPER EXTREMITY MMT:     Right eval Left eval  Shoulder flexion 3-/5 scaption 3-/5  Shoulder abduction 3-/5 3-/5  Shoulder adduction    Shoulder extension    Shoulder internal rotation    Shoulder external rotation    Elbow flexion 3/5 3/5  Elbow extension 2-/5 2-/5  Wrist flexion    Wrist extension 2-/5 2/5  Wrist ulnar deviation    Wrist radial deviation    Wrist pronation    Wrist supination    (Blank rows = not tested)    HAND FUNCTION:  Grip strength: Right: 0 lbs; Left: 0 lbs and Lateral pinch: Right: 5 lbs, Left: 2 lbs  Bilateral digit PIP/DIP flexion contractures with MP hyperextension with attempts for AROM. Pt. is able to tolerate AROM to the bilateral digits at the initial evaluation however, has a history of pain in the digits.  COORDINATION:  Pt. is unable to grasp 9-hole test pegs. Pt. is able to initiate grasping larger pegs, and is able to hold a pen in the left hand.  SENSATION: Light touch: Impaired   EDEMA:  N/A  MUSCLE TONE: BUE flexor Spasticity  COGNITIO Overall cognitive status: Continue to assess in functional context  VISION:   Subjective report: Pt. Was not wearing glasses at the time of the  initial eval.  Baseline vision: Vision is very limited. Wears glasses all the time Visual history: History of impaired vision following CVA. Pt. Has received treatment through the [facility] low vision rehabilitation program.   VISION ASSESSMENT: Impaired To be further assessed in functional context  PERCEPTION: Impaired   PRAXIS: Impaired: motor planning  OBSERVATIONS:  Pt reports being on  Tramadol   TODAY'S TREATMENT:   Self-care:  Pt. worked on the IADL of writing. Pt. worked on formulating 3, and 4 letter words. Pt. formulated words using letters approximately 2 &1/2" in size. Pt. utilized paper secured at an inclined board. Pt. Was able to securely hold a large width marker. A dark color marker was selected for visual compensation for high contrast against the blank white paper background.   Pt. is making progress, and reports more  consistency with feeding himself. Pt. Is now able to reach further with his left hand to scratch the back of his neck. Pt. Is performing hand to mouth patterns with finger foods consistently. Pt. Reports that he used a utensil with his left hand to feed himself. Pt. required visual cues for spacing when formulating words on the paper. Pt. required occasional tactile cues as Pt.'s left hand cramps, and fatigues. Pt. required visual cues for spacing with the occasional overlap of letters.  Pt. continues to work on improving bilateral upper extremity functioning in midline in preparation for improving engagement in ADL, and IADL tasks.    Marland Kitchen.  PATIENT EDUCATION: Education details: OT services, POC, Goals Person educated: Patient and Parent Education method: Explanation, Demonstration, Tactile cues, and Verbal cues Education comprehension: verbalized understanding, returned demonstration, verbal cues required, and needs further education   HOME EXERCISE PROGRAM:   Continue ongoing assessment, and continue to provide as needed.     GOALS: Goals reviewed  with patient? Yes  SHORT TERM GOALS: Target date: 02/14/2022    To assess splint fit, and make appropriate adjustments to promote good skin integrity through the palmar surface of the bilateral hands.  Baseline: Bilateral resting hand splints. Goal status: INITIAL   LONG TERM GOALS: Target date: 03/14/2022     FOTO score will Improve by 2 points for Pt. perceived improvement with the assessment specific ADL/IADL tasks.  Baseline: Eval: FOTO score: 40,  TR score: 45 Goal status: INITIAL  2.   Pt. will independently perform oral care for 100% of the task after complete set-up. Baseline: Eval: Pt. is able to initiate using an electric toothbrush. Pt. requires assist for set-up, and assist for thoroughness, and as  thePt. fatigues. Goal status: INITIAL  3.  Pt. Will be independent with self-feeding for 100% of the meal after complete set-up Baseline: Eval: Pt. is able to hold standard standard utensils. Pt. Performs as much of the task as he, can and has assistance for the remainder. Goal status: INITIAL  4.  Pt. Will improve grasp patterns and consistently grasp 1/2" objects for ADL, and IADL tasks.  Baseline: Eval: Pt. is able to grasp 1" objects intermittently using a lateral grasp pattern. Goal status: INITIAL  5.  Pt. Will legibly write one, out of 3 letters of his name legibly. Baseline: Eval: Pt is able to hold a thin marker with his left hand, and formulate a a line, and initiate a circular pattern (Pt. without glasses today) Goal status: INITIAL  6. Pt. Will reach up to comb/brush his hair  with minA.     Baseline: Pt. is able to initiate reaching up for hair care with a long handled brush, however is unable to sustain UE's in elevation to perform the task.     Goal status: INITIAL      ASSESSMENT:  CLINICAL IMPRESSION:  Pt. required hand-over-hand assist to stabilize the piece in midline with the right hand while connecting them with the left hand. Pt. Required  visual, and verbal cues for visual compensatory strategies as pt. Often overshoots the target  when attempting to connect them with the left hand in midline. Pt. Required the pieces to be moved to the far left when preparing to grasp them off the tabletop surface with the right hand. Pt. continues to work on improving bilateral upper extremity functioning in midline in preparation for improving engagement in ADL, and IADL tasks. Plan to reassess Pt. Next visit for recertification.  PERFORMANCE DEFICITS  in functional skills including ADLs, IADLs, coordination, dexterity, proprioception, ROM, strength, pain, FMC, GMC, decreased knowledge of use of DME, and UE functional use, cognitive skills including safety awareness, and psychosocial skills including coping strategies, environmental adaptation, habits, and routines and behaviors.   IMPAIRMENTS are limiting patient from ADLs, IADLs, leisure, and social participation.   COMORBIDITIES may have co-morbidities  that affects occupational performance. Patient will benefit from skilled OT to address above impairments and improve overall function.  MODIFICATION OR ASSISTANCE TO COMPLETE EVALUATION: Maximum or significant modification of tasks or assist is necessary to complete an evaluation.  OT OCCUPATIONAL PROFILE AND HISTORY: Comprehensive assessment: Review of records and extensive additional review of physical, cognitive, psychosocial history related to current functional performance.  CLINICAL DECISION MAKING: High - multiple treatment options, significant modification of task necessary  REHAB POTENTIAL: Fair    EVALUATION COMPLEXITY: High    PLAN: OT FREQUENCY: 1x/week  OT DURATION:8 weeks  PLANNED INTERVENTIONS: self care/ADL training, therapeutic exercise, therapeutic activity, neuromuscular re-education, manual therapy, passive range of motion, patient/family education, and cognitive remediation/compensation  RECOMMENDED OTHER SERVICES:  PT  CONSULTED AND AGREED WITH PLAN OF CARE: Patient and family member/caregiver  PLAN FOR NEXT SESSION: Initiate treatment   Olegario Messier, MS, OTR/L   Olegario Messier, OT 03/14/2022, 4:09 PM

## 2022-03-16 ENCOUNTER — Ambulatory Visit: Payer: BC Managed Care – PPO

## 2022-03-16 NOTE — Therapy (Incomplete)
OUTPATIENT PHYSICAL THERAPY TREATMENT NOTE   Patient Name: Paul Hall MRN: [id number] DOB:September 04, 1995, 26 y.o., male Today's Date: 03/15/2022  PCP:  Paul Hall PROVIDER:  Collene Mares, PA-C   PT End of Session - 03/14/22 1513     Visit Number 55    Number of Visits 81    Date for PT Re-Evaluation 06/01/22    Authorization Type [employer] and [employer]- Need auth past 12th visit    Authorization Time Period 01/04/21-03/29/21; Recert 17/00/1749-4/49/6759; Recert 1/63/8466- 08/11/9355; Recert 0/17/7939-03/0/0923    Progress Note Due on Visit 19    PT Start Time 1517    PT Stop Time 1600    PT Time Calculation (min) 43 min    Equipment Utilized During Treatment Gait belt   Hoyer lift   Activity Tolerance Patient tolerated treatment well   queasiness/nausea   Behavior During Therapy WFL for tasks assessed/performed                    Past Medical History:  Diagnosis Date   Diabetes mellitus ([place])    Hypertension    Stroke [facility])    Past Surgical History:  Procedure Laterality Date   IVC FILTER PLACEMENT ([place] [facility])     LEG SURGERY     PEG TUBE PLACEMENT     TRACHEOSTOMY     Patient Active Problem List   Diagnosis Date Noted   Sepsis due to vancomycin resistant Enterococcus species ([place]) 06/06/2019   SIRS (systemic inflammatory response syndrome) ([place]) 06/05/2019   Acute lower UTI 06/05/2019   VRE (vancomycin-resistant Enterococci) infection 06/05/2019   Anemia 06/05/2019   Skin ulcer of sacrum with necrosis of muscle ([place])    Urinary retention    Type 2 diabetes mellitus without complication, with long-term current use of insulin ([facility])    Tachycardia    Lower extremity edema    Acute metabolic encephalopathy    Obstructive sleep apnea    Morbid obesity with BMI of 60.0-69.9, adult ([place])    Goals of care, counseling/discussion    Palliative care encounter    Sepsis ([place]) 04/27/2019   H/O insulin dependent diabetes mellitus 04/27/2019    History of CVA with residual deficit 04/27/2019   Seizure disorder ([place]) 04/27/2019   Decubitus ulcer of sacral region, stage 4 ([place]) 04/27/2019    REFERRING DIAG: Cerebral infarction, unspecified   THERAPY DIAG:  Muscle weakness (generalized)  Difficulty in walking, not elsewhere classified  Other lack of coordination  History of CVA with residual deficit  Unsteadiness on feet  Abnormality of gait and mobility  Other abnormalities of gait and mobility  Rationale for Evaluation and Treatment Rehabilitation  PERTINENT HISTORY: Paul Hall is a 25yoM who presents with severe weakness, quadriparesis, altered sensorium, and visual impairment s/p critical illness and prolonged hospitalization. Pt hospitalized in October 2020 with ARDS 2/2 COVID19 infection. Pt sustained a complex and lengthy hospitalization which included tracheostomy, prolonged sedation, ECMO. In this period pt sustained CVA and SDH. Pt has now been liberated from tracheostomy and G-tube. Pt has since been hospitalized for wound infection and UTI. Pt lives with parents at home, has hospital bed and left chair, hoyer lift transfers, and power WC for mobility needs. Pt needs heavy physical assistance with ADL 2/2 BUE contractures and motor dysfunction   PRECAUTIONS: Fall  SUBJECTIVE: Pt reports he is having more issues with stomach today and not feeling great.   Patient reports PAIN:  Are you having pain? No   TODAY'S TREATMENT:  Therex:       Seated hip add with manual resistance- 2*10 reps  Seated hip abd with manual resistance 2*10 reps      Pyramid LE Strengthening knee ext 2lb x 12 reps each LE 2.5 x 10 reps each LE 3 # x 8 reps each LE 4# x 6 reps each LE  AAROM with BLE - hip flexion- x 12 reps   Education provided throughout session via VC/TC and demonstration to facilitate movement at target joints and co rrect muscle activation for all testing and exercises performed.       PATIENT  EDUCATION: Education details: posture cues with sitting Person educated: Patient Education method: Explanation, Demonstration, Tactile cues, and Verbal cues Education comprehension: verbalized understanding, returned demonstration, verbal cues required, tactile cues required, and needs further education   HOME EXERCISE PROGRAM:    PT Short Term Goals -       PT SHORT TERM GOAL #1   Title Pt will be independent with HEP in order to improve strength and balance in order to decrease fall risk and improve function at home and work.    Baseline 01/04/2021= No formal HEP in place; 12/12 no HEP in place; 05/10/2021-Patient and his father were able to report compliance with curent HEP consisting of mostly seated/reclined LE strengthening. Both verbalize no questions at this time.    Time 6    Period Weeks    Status Achieved    Target Date 02/15/21              PT Long Term Goals - Date for completion: 06/01/2022         PT LONG TERM GOAL #1   Title Patient will increase BLE gross strength by 1/2 muscle grade to improve functional strength for improved independence with potential gait, increased standing tolerance and increased ADL ability.    Baseline 01/04/2021- Patient presents with 1/5 to 3-/5 B LE strength with MMT; 12/12: goal partially met for Left knee/hip; 05/10/2021= 2-/5 bilateal Hip flex; 3+/5 bilateral Knee ext; 06/21/2021= Patient presents with 2-/5 bilateral Hip flex; 3+/5 bilateral knee ext/flex; 2-/5 left ankle DF; 0/5 right ankle- and able to increase reps and resistance with LE's. 09/15/2021- Patient technically presents with 2-/5 B hip flex/abd/add - but he is able to raise his hip up to approx 100 deg which has improved. 3+/5 Bilateral knee ext, 2-/5 left ankle and 0/5 right ankle.  12/08/2021= Patient able to lift left knee at 110 deg of hip flex; presents with 3+/5 knee ext, 2-/5 left ankle DF and 0/5 right ankle DF, 2-/5 bilateral Hip abd in seated position.   12/6: R: knee  3+/5 ext, 2/5 flexion, left knee 3+/5 extension, 3+/5 flexion, R hip: 2+/5 hip add, 2+/5 hip ABD L hip: 4-/5 hip ABD, 3+/5 hip ADD, 3+/5 hip flexion    Time 12    Period Weeks    Status Partially met, met with exception of ankle musculature on the R LE    Target Date 03/07/2022     PT LONG TERM GOAL #2   Title Patient will tolerate sitting unsupported demonstrating erect sitting posture for 15 minutes with CGA to demonstrate improved back extensor strength and improved sitting tolerance.    Baseline 01/04/2021- Patient confied to sitting in lift chair or electric power chair with back support and unable to sit upright without physical assistance; 12/12: tolerates <1 minutes upright unsupported sitting. 05/10/2021=static sit with forward trunk lean  in his power wheelchair without back  support x approx 3 min. 06/21/2021=Unable to assess today due to patient with acute back pain but on previous visit able to sit x 8 min without back support. 09/15/2021- on last visit- 09/13/2021- patient was able to sit unsupported x 8 min at edge of mat. 10/13/2023 - Patient was able to sit at edge of mat with varying level of assist today from SBA to min A for a total of 20 min. 12/13/2021= Patient demonstrated unsupported sitting at edge of mat for approx 20 min   Time 12    Period Weeks    Status GOAL MET   Target Date 12/08/21      PT LONG TERM GOAL #3   Title Patient will demonstrate ability to perform static standing in // bars > 2 min with Max Assist  without loss of balance and fair posture for improved overall strength for pre-gait and transfer activities.    Baseline 01/04/2021= Patient current uanble to stand- Dependent on hoyer or sit to stand lift for transfers. 05/10/2021=Not appropriate yet- Currently still dependent with all transfers using hoyer. 06/21/2021= Patient continuing now to focus on LE strengthening to prepare for standing-unable to try today due to acute low back pain-  planning on attempting in new  cert period. 09/15/2021- Patient has attempted standing 2x in past two week- max Assist of 2 people - only once was he successful to clearing his bottom from chair - Will continue to be a focus during the new certification. 12/13/2021= Patient has been limited secondary to increased overall low back pain during this certification and will require more time to focus on this goal.  12/6: not assessed this date, will assess at date when 2-3 PTs are present for assistance    Time 12    Period Weeks    Status On-going          PT LONG TERM GOAL #4   Title Pt will improve FOTO score by 10 points or more demonstrating improved perceived functional ability    Baseline FOTO 7 on 10/17; 03/15/21: FOTO 12; 05/10/2021 06/21/2021= 1; 09/15/2021= 9; 12/13/2021= Will issue next visit 12/6: 4   Time 12    Period Weeks    Status Not Met          PT LONG TERM GOAL #5   Title Patient will perform sit to stand transfer with appropriate AD and max assist of 2 people with 75% consistency to prepare for pregait activities.    Baseline 09/15/2021= Patient unable to stand well- unable to clear his bottom off chair with Max assist of 2 persons. 12/13/2021- Goal not appropriate to try yet but will keep and roll over to next cert as shift continues to focus on transfers/standing   Time 12    Period Weeks    Status New        PT LONG TERM GOAL #6  Title Patient will tolerate sitting unsupported demonstrating erect sitting posture for 30 minutes with CGA to demonstrate improved back extensor strength and improved sitting tolerance.   Baseline 12/13/2021= Patient demonstrated unsupported sitting at edge of mat for approx 20 min;  12/29/2021- Patient performed approx 30 min of dynamic sitting activities today. 12/6:   Time 12   Period Weeks   Status ONGOING         7.  Patient will tolerate 5 minutes or more of standing in sit to stand lift in order to indicate improved lower extremity weightbearing tolerance for progression  to standing  in parallel bars. Baseline: 1 minute on most recent stand 02/21/22 Goal status: INITIAL       Plan     Clinical Impression Statement Treatment limited to LE strengthening in chair as patient continues to have some complaints of stomach issues. He continues to progress overall with Right LE strength as seen by increased resistance with pyramid designed strengthening activities.   Pt will continue to benefit from skilled physical therapy intervention to address impairments, improve QOL, and attain therapy goals.     Personal Factors and Comorbidities Comorbidity 3+;Time since onset of injury/illness/exacerbation    Comorbidities CVA, diabetes, Seizures    Examination-Activity Limitations Bathing;Bed Mobility;Bend;Caring for Others;Carry;Dressing;Hygiene/Grooming;Lift;Locomotion Level;Reach Overhead;Self Feeding;Sit;Squat;Stairs;Stand;Transfers;Toileting    Examination-Participation Restrictions Cleaning;Community Activity;Driving;Laundry;Medication Management;Meal Prep;Occupation;Personal Finances;Shop;Yard Work;Volunteer    Stability/Clinical Decision Making Evolving/Moderate complexity    Rehab Potential Fair    PT Frequency 2x / week    PT Duration 12 weeks    PT Treatment/Interventions ADLs/Self Care Home Management;Cryotherapy;Electrical Stimulation;Moist Heat;Ultrasound;DME Instruction;Gait training;Stair training;Functional mobility training;Therapeutic exercise;Balance training;Patient/family education;Orthotic Fit/Training;Neuromuscular re-education;Wheelchair mobility training;Manual techniques;Passive range of motion;Dry needling;Energy conservation;Taping;Visual/perceptual remediation/compensation;Joint Manipulations    PT Next Visit Plan core strength- sitting at EOM, postural control, sabina sit to stand if able; Continue with progressive LE Strengthening    PT Home Exercise Plan No changes to HEP today    Consulted and Agree with Plan of Care Patient;Family  member/caregiver    Family Member Consulted Dad               Lewis Moccasin, PT 03/15/2022, 12:44 PM

## 2022-03-21 ENCOUNTER — Ambulatory Visit: Payer: BC Managed Care – PPO | Admitting: Occupational Therapy

## 2022-03-21 ENCOUNTER — Ambulatory Visit: Payer: BC Managed Care – PPO

## 2022-03-21 DIAGNOSIS — R2681 Unsteadiness on feet: Secondary | ICD-10-CM | POA: Diagnosis not present

## 2022-03-21 DIAGNOSIS — R2689 Other abnormalities of gait and mobility: Secondary | ICD-10-CM | POA: Diagnosis not present

## 2022-03-21 DIAGNOSIS — R278 Other lack of coordination: Secondary | ICD-10-CM | POA: Diagnosis not present

## 2022-03-21 DIAGNOSIS — R262 Difficulty in walking, not elsewhere classified: Secondary | ICD-10-CM

## 2022-03-21 DIAGNOSIS — M6281 Muscle weakness (generalized): Secondary | ICD-10-CM | POA: Diagnosis not present

## 2022-03-21 DIAGNOSIS — R269 Unspecified abnormalities of gait and mobility: Secondary | ICD-10-CM | POA: Diagnosis not present

## 2022-03-21 DIAGNOSIS — I6930 Unspecified sequelae of cerebral infarction: Secondary | ICD-10-CM

## 2022-03-21 DIAGNOSIS — H543 Unqualified visual loss, both eyes: Secondary | ICD-10-CM | POA: Diagnosis not present

## 2022-03-21 NOTE — Therapy (Incomplete)
OUTPATIENT OCCUPATIONAL THERAPY TREATMENT/RECERTIFICATION  Patient Name: Paul Hall MRN: [id number] DOB:1995-08-03, 26 y.o., male Today's Date: 03/22/2022  PCP: Dr. Sherwood Gambler REFERRING PROVIDER: Dr. Sherwood Gambler   OT End of Session - 03/21/22 1743     Visit Number 9    Number of Visits 12    Date for OT Re-Evaluation 06/13/22    OT Start Time 1645    OT Stop Time 1730    OT Time Calculation (min) 45 min    Activity Tolerance Patient tolerated treatment well    Behavior During Therapy WFL for tasks assessed/performed                     Past Medical History:  Diagnosis Date   Diabetes mellitus (HCC)    Hypertension    Stroke [facility])    Past Surgical History:  Procedure Laterality Date   IVC FILTER PLACEMENT ([facility] HX)     LEG SURGERY     PEG TUBE PLACEMENT     TRACHEOSTOMY     Patient Active Problem List   Diagnosis Date Noted   Sepsis due to vancomycin resistant Enterococcus species (HCC) 06/06/2019   SIRS (systemic inflammatory response syndrome) (HCC) 06/05/2019   Acute lower UTI 06/05/2019   VRE (vancomycin-resistant Enterococci) infection 06/05/2019   Anemia 06/05/2019   Skin ulcer of sacrum with necrosis of muscle (HCC)    Urinary retention    Type 2 diabetes mellitus without complication, with long-term current use of insulin (HCC)    Tachycardia    Lower extremity edema    Acute metabolic encephalopathy    Obstructive sleep apnea    Morbid obesity with BMI of 60.0-69.9, adult (HCC)    Goals of care, counseling/discussion    Palliative care encounter    Sepsis (HCC) 04/27/2019   H/O insulin dependent diabetes mellitus 04/27/2019   History of CVA with residual deficit 04/27/2019   Seizure disorder (HCC) 04/27/2019   Decubitus ulcer of sacral region, stage 4 (HCC) 04/27/2019    ONSET DATE: 01/2019  REFERRING DIAG: CVA/COVID-19  THERAPY DIAG:  Muscle weakness (generalized)  Rationale for Evaluation and Treatment Rehabilitation  SUBJECTIVE:    Patient reports that transportation was late picking him up this afternoon.  SUBJECTIVE STATEMENT:  Pt. reports  doing well today.  Pt accompanied by: self and family member  PERTINENT HISTORY:  Pt. is a 26 y.o. male who was diagnosed with COVID-19, and CVA with resultant quadriplegia. Pt. Was then hospitalized with VRE UTI. PMHx includes: urinary retention, seizure disorder, obstructive sleep disorder, DM Type II, Morbid obesity.   PRECAUTIONS: None  WEIGHT BEARING RESTRICTIONS No  PAIN:  Are you having pain? No  FALLS: Has patient fallen in last 6 months? No  LIVING ENVIRONMENT: Lives with: lives with their family Lives in: House/apartment Stairs: No Level Entry Has following equipment at home: Wheelchair (power) and hoyer lift, sit to stand lift  PLOF: Independent  PATIENT GOALS: To be able to engage in more daily care tasks.  OBJECTIVE:   HAND DOMINANCE: Right  ADLs:  Transfers/ambulation related to ADLs: Eating: Pt. Reports being able to hold standard utensils, and is starting to engage more in self-feeding tasks, hand to mouth patterns. Pt. Reports that he does as much as he can with the task, and family assists  with the remainder of the task. Grooming: Pt. Is able to initiate holding an electric toothbrush, and brush his teeth. Family assists LB Dressing: Total Assist UE dressing: Pt. is now able  to reach up to actively assist with grasping , and pulling his gown down. Toileting:  Total Assist Bathing: MaxA UB, Total assist LB Tub Shower transfers: N/A Equipment: See above    IADLs: Shopping: Relies on family to assist Light housekeeping: Total Assist Meal Prep: Total Assist Community mobility:   Medication management:  Total Assist  Financial management: N/A Handwriting: Not legible: Pt. Is able to hold a pen with the left hand, and initiate marking the page. Pt.'s eye glasses were not available  MOBILITY STATUS:  Power w/c  POSTURE COMMENTS:   Pt. Requires position changes in his power w/c  ACTIVITY TOLERANCE: Activity tolerance:  Fair  FUNCTIONAL OUTCOME MEASURES: FOTO: 40  TR score: 45  UPPER EXTREMITY ROM     Active ROM Right eval Right 03/21/2022  Left eval Left  03/21/2022  Shoulder flexion 106 scaption 105  Scaption  119 123  Shoulder abduction 114 90  110 115  Shoulder adduction       Shoulder extension       Shoulder internal rotation       Shoulder external rotation       Elbow flexion 120(130) 145  135(135) 145  Elbow extension -45(-35) -22(-35)  -27(-18) -22(145)  Wrist flexion       Wrist extension -30(10) -25(10)  10(50 19(50)  Wrist ulnar deviation       Wrist radial deviation       Wrist pronation       Wrist supination       (Blank rows = not tested)      UPPER EXTREMITY MMT:     Right eval Right 03/21/2022  Left eval Left 03/21/2022  Shoulder flexion 3-/5 scaption 3-/5  3-/5 3/5  Shoulder abduction 3-/5 3-/5  3-/5 3-/5  Shoulder adduction       Shoulder extension       Shoulder internal rotation       Shoulder external rotation       Elbow flexion 3/5 3/5  3/5 3+/5  Elbow extension 2-/5 2/5  2-/5 2/5  Wrist flexion       Wrist extension 2-/5 2-/5  2/5 2+/5  Wrist ulnar deviation       Wrist radial deviation       Wrist pronation       Wrist supination       (Blank rows = not tested)     HAND FUNCTION:  Grip strength: Right: 0 lbs; Left: 0 lbs and Lateral pinch: Right: 5 lbs, Left: 2 lbs  03/21/2022: Lateral pinch: Right: 3.5 lbs, Left: 2 lbs  Bilateral digit PIP/DIP flexion contractures with MP hyperextension with attempts for AROM. Pt. is able to tolerate AROM to the bilateral digits at the initial evaluation however, has a history of pain in the digits.  COORDINATION:  Eval: Pt. is unable to grasp 9-hole test pegs. Pt. is able to initiate grasping larger pegs, and is able to hold a pen in the left hand.  SENSATION: Light touch: Impaired   EDEMA:  N/A  MUSCLE  TONE: BUE flexor Spasticity  COGNITIO Overall cognitive status: Continue to assess in functional context  VISION:   Subjective report: Pt. Was not wearing glasses at the time of the initial eval.  Baseline vision: Vision is very limited. Wears glasses all the time Visual history: History of impaired vision following CVA. Pt. Has received treatment through the [facility] low vision rehabilitation program.   VISION ASSESSMENT: Impaired To be further assessed in  functional context  PERCEPTION: Impaired   PRAXIS: Impaired: motor planning  OBSERVATIONS:  Pt reports being on  Tramadol   TODAY'S TREATMENT:   Pt. has made progress with LUE functioning over this past recertification period. Pt. Has improved with LUE ROM in shoulder flexion, abduction, elbow extension, and wrist extension. Pt. Has improved with ROM for right elbow, and wrist extension.  Pt. Is now engaging the left hand more during ADL care tasks including: hand to mouth/face patterns for self feeding for utensil use, and oral care, and hair care. Pt. Continues to require assist proximally, and assist for supination when completing the necessary movements for using a spoon. Pt. Is able to use a fork consistently, and each finger foods/popcorn from a bowl positioned for easier access and to promote independence. Pt. Is using his hand to assist with repositioning the CPAP machine. Pt. Continues to work towards writing his name independently, and legibly. Pt. Is currently able to write his name with ModA to formulate large printed letters with 50% legibility for the 3 letters of his name. Pt. continues to work towards improving bilateral hand function in order to improve engagement in, participation in, and maximize independence with ADLs, and IADLs    .  PATIENT EDUCATION: Education details: OT services, POC, Goals Person educated: Patient and Parent Education method: Explanation, Demonstration, Tactile cues, and Verbal cues Education  comprehension: verbalized understanding, returned demonstration, verbal cues required, and needs further education   HOME EXERCISE PROGRAM:   Continue ongoing assessment, and continue to provide as needed.     GOALS: Goals reviewed with patient? Yes  SHORT TERM GOALS: Target date:05/03/2022     To assess splint fit, and make appropriate adjustments to promote good skin integrity through the palmar surface of the bilateral hands.  Baseline:  03/21/2022: Pt. is wearing splints a couple of hours at night bilateral resting hand splints. Goal status: Continue ongoing assessment as requested, and as needed   LONG TERM GOALS: Target date: 06/13/2022     FOTO score will Improve by 2 points for Pt. perceived improvement with the assessment specific ADL/IADL tasks.  Baseline: Eval: FOTO score: 40,  TR score: 45 Goal status: Ongoing  2.   Pt. will independently perform oral care for 100% of the task after complete set-up. Baseline:03/21/2022: Pt. Is able to initiate and perform oral care for approximately 75% of the task. Pt. Requires assist at proximally at the elbow for through oral care. Eval: Pt. is able to initiate using an electric toothbrush. Pt. requires assist for set-up, and assist for thoroughness, and as he Pt. fatigues. Goal status: Ongoing  3.  Pt. Will be independent with self-feeding for 100% of the meal after complete set-up Baseline: 03/21/2022: Pt. Is able to perform scooping cereal for 75% of the time. Pt. required assist, and support at the left elbow, and Pt. Presents with limited forearm supination when using the spoon, and bringing it towards his mouth. Pt. Is able to use a fork to spear items, and perform the hand to mouth pattern.  Eval: Pt. is able to hold standard standard utensils. Pt. Performs as much of the task as he, can and has assistance for the remainder. Goal status: Ongoing  4.  Pt. Will improve grasp patterns and consistently grasp 1/2" objects for ADL,  and IADL tasks.  Baseline: 03/21/2022: Pt. Is able to grasp 1" objects consistently,and continues to work on the hand patterns needed to grasp 1/2" objects. Eval: Pt. is able to grasp  1" objects intermittently using a lateral grasp pattern. Goal status: Ongoing  5.  Pt. will independently write his name legibly with letter sizes under 2". Baseline: 03/21/2022: Pt. Is able to write his name with modA, however has difficulty with formulating letter sizes less than 2" in size with 50% legibility for the 3 letters of his name. Eval: Pt is able to hold a thin marker with his left hand, and formulate a line, and initiate a circular pattern (Pt. without glasses today) Goal status: Ongoing  6. Pt. Will reach up to comb/brush his hair  with minA.     Baseline: 03/21/2022: Pt. is now able to more consistently initiate reaching up to his head with his left hand in preparation for haircare. Eval: Pt. is able to initiate reaching up for hair care with a long handled brush, however is unable to sustain UE's in elevation to perform the task.     Goal status: Ongoing      ASSESSMENT:  CLINICAL IMPRESSION:  Pt. has made progress with LUE functioning over this past recertification period. Pt. Has improved with LUE ROM in shoulder flexion, abduction, elbow extension, and wrist extension. Pt. Has improved with ROM for right elbow, and wrist extension.  Pt. Is now engaging the left hand more during ADL care tasks including: hand to mouth/face patterns for self feeding for utensil use, and oral care, and hair care. Pt. Continues to require assist proximally, and assist for supination when completing the necessary movements for using a spoon. Pt. Is able to use a fork consistently, and each finger foods/popcorn from a bowl positioned for easier access and to promote independence. Pt. Is using his hand to assist with repositioning the CPAP machine. Pt. Continues to work towards writing his name independently, and  legibly. Pt. Is currently able to write his name with [place] to formulate large printed letters with 50% legibility for the 3 letters of his name. Pt. continues to work towards improving bilateral hand function in order to improve engagement in, participation in, and maximize independence with ADLs, and IADLs   PERFORMANCE DEFICITS in functional skills including ADLs, IADLs, coordination, dexterity, proprioception, ROM, strength, pain, [place], [place], decreased knowledge of use of DME, and UE functional use, cognitive skills including safety awareness, and psychosocial skills including coping strategies, environmental adaptation, habits, and routines and behaviors.   IMPAIRMENTS are limiting patient from ADLs, IADLs, leisure, and social participation.   COMORBIDITIES may have co-morbidities  that affects occupational performance. Patient will benefit from skilled OT to address above impairments and improve overall function.  MODIFICATION OR ASSISTANCE TO COMPLETE EVALUATION: Maximum or significant modification of tasks or assist is necessary to complete an evaluation.  OT OCCUPATIONAL PROFILE AND HISTORY: Comprehensive assessment: Review of records and extensive additional review of physical, cognitive, psychosocial history related to current functional performance.  CLINICAL DECISION MAKING: High - multiple treatment options, significant modification of task necessary  REHAB POTENTIAL: Fair    EVALUATION COMPLEXITY: High    PLAN: OT FREQUENCY: 2 x's a week  OT DURATION:12 weeks  PLANNED INTERVENTIONS: self care/ADL training, therapeutic exercise, therapeutic activity, neuromuscular re-education, manual therapy, passive range of motion, patient/family education, and cognitive remediation/compensation  RECOMMENDED OTHER SERVICES: PT  CONSULTED AND AGREED WITH PLAN OF CARE: Patient and family member/caregiver  PLAN FOR NEXT SESSION: Initiate treatment   Harrel Carina, MS, OTR/L   Harrel Carina, OT 03/22/2022, 11:49 AM

## 2022-03-22 ENCOUNTER — Other Ambulatory Visit: Payer: Self-pay | Admitting: Dermatology

## 2022-03-22 DIAGNOSIS — L8994 Pressure ulcer of unspecified site, stage 4: Secondary | ICD-10-CM | POA: Diagnosis not present

## 2022-03-22 DIAGNOSIS — I6389 Other cerebral infarction: Secondary | ICD-10-CM | POA: Diagnosis not present

## 2022-03-22 DIAGNOSIS — L732 Hidradenitis suppurativa: Secondary | ICD-10-CM

## 2022-03-22 DIAGNOSIS — R32 Unspecified urinary incontinence: Secondary | ICD-10-CM | POA: Diagnosis not present

## 2022-03-22 NOTE — Therapy (Signed)
OUTPATIENT PHYSICAL THERAPY TREATMENT NOTE   Patient Name: Paul Hall MRN: [id number] DOB:01/31/1996, 26 y.o., male Today's Date: 03/22/2022  PCP:  Loistine Chance PROVIDER:  Collene Mares, PA-C   PT End of Session - 03/21/22 0818     Visit Number 65    Number of Visits 81    Date for PT Re-Evaluation 06/01/22    Authorization Type [employer] and [employer]- Need auth past 12th visit    Authorization Time Period 01/04/21-03/29/21; Recert 95/28/4132-4/40/1027; Recert 2/53/6644- 0/06/4740; Recert 5/95/6387-56/07/3327    Progress Note Due on Visit 51    PT Start Time 1602    PT Stop Time 5188    PT Time Calculation (min) 43 min    Equipment Utilized During Treatment Gait belt   Hoyer lift   Activity Tolerance Patient tolerated treatment well   queasiness/nausea   Behavior During Therapy WFL for tasks assessed/performed                    Past Medical History:  Diagnosis Date   Diabetes mellitus ([place])    Hypertension    Stroke ([place])    Past Surgical History:  Procedure Laterality Date   IVC FILTER PLACEMENT ([place] [facility])     LEG SURGERY     PEG TUBE PLACEMENT     TRACHEOSTOMY     Patient Active Problem List   Diagnosis Date Noted   Sepsis due to vancomycin resistant Enterococcus species ([place]) 06/06/2019   SIRS (systemic inflammatory response syndrome) ([place]) 06/05/2019   Acute lower UTI 06/05/2019   VRE (vancomycin-resistant Enterococci) infection 06/05/2019   Anemia 06/05/2019   Skin ulcer of sacrum with necrosis of muscle ([place])    Urinary retention    Type 2 diabetes mellitus without complication, with long-term current use of insulin ([facility])    Tachycardia    Lower extremity edema    Acute metabolic encephalopathy    Obstructive sleep apnea    Morbid obesity with BMI of 60.0-69.9, adult ([place])    Goals of care, counseling/discussion    Palliative care encounter    Sepsis ([place]) 04/27/2019   H/O insulin dependent diabetes mellitus 04/27/2019    History of CVA with residual deficit 04/27/2019   Seizure disorder ([place]) 04/27/2019   Decubitus ulcer of sacral region, stage 4 ([place]) 04/27/2019    REFERRING DIAG: Cerebral infarction, unspecified   THERAPY DIAG:  Muscle weakness (generalized)  Other lack of coordination  Difficulty in walking, not elsewhere classified  History of CVA with residual deficit  Unsteadiness on feet  Abnormality of gait and mobility  Other abnormalities of gait and mobility  Rationale for Evaluation and Treatment Rehabilitation  PERTINENT HISTORY: Paul Hall is a 25yoM who presents with severe weakness, quadriparesis, altered sensorium, and visual impairment s/p critical illness and prolonged hospitalization. Pt hospitalized in October 2020 with ARDS 2/2 COVID19 infection. Pt sustained a complex and lengthy hospitalization which included tracheostomy, prolonged sedation, ECMO. In this period pt sustained CVA and SDH. Pt has now been liberated from tracheostomy and G-tube. Pt has since been hospitalized for wound infection and UTI. Pt lives with parents at home, has hospital bed and left chair, hoyer lift transfers, and power WC for mobility needs. Pt needs heavy physical assistance with ADL 2/2 BUE contractures and motor dysfunction   PRECAUTIONS: Fall  SUBJECTIVE: Pt reports doing well and presents today with his mother who states she will likely be bringing him for a while. She reports that even though he missed his  visits last week that they had him working on his strength at home.    Patient reports PAIN:  Are you having pain? No   TODAY'S TREATMENT:     Therapeutic Activities:   Patient mother positioned power wheelchair next to mat table and then author performed dependent hoyer transfer from w/c to adjustable mat table (positioned in the seated position) and patient was successfully positioned into seat position of mat table with +2 assist.   Patient was able to transition from  reclined position on mat to upright sitting with +2 assist then able to perform static seated position with only SBA with good upright posture today while author moved hoyer away and positioned sit to stand lift in front of patient- applied straps with assist of mother and patient able to hold himself upright through process.  He required physical assist to position right leg (adduct) into knee support of lift.   He then performed dependent stand using lift with 1 person on each side - attempting to allow patient to try to bear some weight through his forearms on each person assisting. He performed 3 trials of standing today 1) 2 min 33 sec 2) 3 min 07 sec 3) 1 min 53 sec - stopped due fatigue and patient stating he was too fatigued to continue. He was able to maintain seated position again for several min and then dependent hoyer transfer back to power wheelchair +2 assist.   Patient reported too fatigue to attempt any strengthening at end of session.     Education provided throughout session via VC/TC and demonstration to facilitate movement at target joints and co rrect muscle activation for all testing and exercises performed.       PATIENT EDUCATION: Education details: posture cues with sitting Person educated: Patient Education method: Explanation, Demonstration, Tactile cues, and Verbal cues Education comprehension: verbalized understanding, returned demonstration, verbal cues required, tactile cues required, and needs further education   HOME EXERCISE PROGRAM:    PT Short Term Goals -       PT SHORT TERM GOAL #1   Title Pt will be independent with HEP in order to improve strength and balance in order to decrease fall risk and improve function at home and work.    Baseline 01/04/2021= No formal HEP in place; 12/12 no HEP in place; 05/10/2021-Patient and his father were able to report compliance with curent HEP consisting of mostly seated/reclined LE strengthening. Both verbalize no  questions at this time.    Time 6    Period Weeks    Status Achieved    Target Date 02/15/21              PT Long Term Goals - Date for completion: 06/01/2022         PT LONG TERM GOAL #1   Title Patient will increase BLE gross strength by 1/2 muscle grade to improve functional strength for improved independence with potential gait, increased standing tolerance and increased ADL ability.    Baseline 01/04/2021- Patient presents with 1/5 to 3-/5 B LE strength with MMT; 12/12: goal partially met for Left knee/hip; 05/10/2021= 2-/5 bilateal Hip flex; 3+/5 bilateral Knee ext; 06/21/2021= Patient presents with 2-/5 bilateral Hip flex; 3+/5 bilateral knee ext/flex; 2-/5 left ankle DF; 0/5 right ankle- and able to increase reps and resistance with LE's. 09/15/2021- Patient technically presents with 2-/5 B hip flex/abd/add - but he is able to raise his hip up to approx 100 deg which has improved. 3+/5 Bilateral  knee ext, 2-/5 left ankle and 0/5 right ankle.  12/08/2021= Patient able to lift left knee at 110 deg of hip flex; presents with 3+/5 knee ext, 2-/5 left ankle DF and 0/5 right ankle DF, 2-/5 bilateral Hip abd in seated position.   12/6: R: knee 3+/5 ext, 2/5 flexion, left knee 3+/5 extension, 3+/5 flexion, R hip: 2+/5 hip add, 2+/5 hip ABD L hip: 4-/5 hip ABD, 3+/5 hip ADD, 3+/5 hip flexion    Time 12    Period Weeks    Status Partially met, met with exception of ankle musculature on the R LE    Target Date 03/07/2022     PT LONG TERM GOAL #2   Title Patient will tolerate sitting unsupported demonstrating erect sitting posture for 15 minutes with CGA to demonstrate improved back extensor strength and improved sitting tolerance.    Baseline 01/04/2021- Patient confied to sitting in lift chair or electric power chair with back support and unable to sit upright without physical assistance; 12/12: tolerates <1 minutes upright unsupported sitting. 05/10/2021=static sit with forward trunk lean  in his  power wheelchair without back support x approx 3 min. 06/21/2021=Unable to assess today due to patient with acute back pain but on previous visit able to sit x 8 min without back support. 09/15/2021- on last visit- 09/13/2021- patient was able to sit unsupported x 8 min at edge of mat. 10/13/2023 - Patient was able to sit at edge of mat with varying level of assist today from SBA to min A for a total of 20 min. 12/13/2021= Patient demonstrated unsupported sitting at edge of mat for approx 20 min   Time 12    Period Weeks    Status GOAL MET   Target Date 12/08/21      PT LONG TERM GOAL #3   Title Patient will demonstrate ability to perform static standing in // bars > 2 min with Max Assist  without loss of balance and fair posture for improved overall strength for pre-gait and transfer activities.    Baseline 01/04/2021= Patient current uanble to stand- Dependent on hoyer or sit to stand lift for transfers. 05/10/2021=Not appropriate yet- Currently still dependent with all transfers using hoyer. 06/21/2021= Patient continuing now to focus on LE strengthening to prepare for standing-unable to try today due to acute low back pain-  planning on attempting in new cert period. 09/15/2021- Patient has attempted standing 2x in past two week- max Assist of 2 people - only once was he successful to clearing his bottom from chair - Will continue to be a focus during the new certification. 12/13/2021= Patient has been limited secondary to increased overall low back pain during this certification and will require more time to focus on this goal.  12/6: not assessed this date, will assess at date when 2-3 PTs are present for assistance    Time 12    Period Weeks    Status On-going          PT LONG TERM GOAL #4   Title Pt will improve FOTO score by 10 points or more demonstrating improved perceived functional ability    Baseline FOTO 7 on 10/17; 03/15/21: FOTO 12; 05/10/2021 06/21/2021= 1; 09/15/2021= 9; 12/13/2021= Will issue  next visit 12/6: 4   Time 12    Period Weeks    Status Not Met          PT LONG TERM GOAL #5   Title Patient will perform sit to stand transfer  with appropriate AD and max assist of 2 people with 75% consistency to prepare for pregait activities.    Baseline 09/15/2021= Patient unable to stand well- unable to clear his bottom off chair with Max assist of 2 persons. 12/13/2021- Goal not appropriate to try yet but will keep and roll over to next cert as shift continues to focus on transfers/standing   Time 12    Period Weeks    Status New        PT LONG TERM GOAL #6  Title Patient will tolerate sitting unsupported demonstrating erect sitting posture for 30 minutes with CGA to demonstrate improved back extensor strength and improved sitting tolerance.   Baseline 12/13/2021= Patient demonstrated unsupported sitting at edge of mat for approx 20 min;  12/29/2021- Patient performed approx 30 min of dynamic sitting activities today. 12/6:   Time 12   Period Weeks   Status ONGOING         7.  Patient will tolerate 5 minutes or more of standing in sit to stand lift in order to indicate improved lower extremity weightbearing tolerance for progression to standing in parallel bars. Baseline: 1 minute on most recent stand 02/21/22 Goal status: INITIAL       Plan     Clinical Impression Statement Patient arrived in good spirits denying any pain or issues- agreeable to standing today with mother present to assist. He was able to sit at edge of mat (in chair position) well today with only SBA and good upright posture without UE support. He was able to stand yet unable to fully extend glutes yet able to tighten some and extend knees some. He continues to fatigue quickly but also able to bear some weight through bilateral forearms today.  Pt will continue to benefit from skilled physical therapy intervention to address impairments, improve QOL, and attain therapy goals.     Personal Factors and  Comorbidities Comorbidity 3+;Time since onset of injury/illness/exacerbation    Comorbidities CVA, diabetes, Seizures    Examination-Activity Limitations Bathing;Bed Mobility;Bend;Caring for Others;Carry;Dressing;Hygiene/Grooming;Lift;Locomotion Level;Reach Overhead;Self Feeding;Sit;Squat;Stairs;Stand;Transfers;Toileting    Examination-Participation Restrictions Cleaning;Community Activity;Driving;Laundry;Medication Management;Meal Prep;Occupation;Personal Finances;Shop;Yard Work;Volunteer    Stability/Clinical Decision Making Evolving/Moderate complexity    Rehab Potential Fair    PT Frequency 2x / week    PT Duration 12 weeks    PT Treatment/Interventions ADLs/Self Care Home Management;Cryotherapy;Electrical Stimulation;Moist Heat;Ultrasound;DME Instruction;Gait training;Stair training;Functional mobility training;Therapeutic exercise;Balance training;Patient/family education;Orthotic Fit/Training;Neuromuscular re-education;Wheelchair mobility training;Manual techniques;Passive range of motion;Dry needling;Energy conservation;Taping;Visual/perceptual remediation/compensation;Joint Manipulations    PT Next Visit Plan core strength- sitting at EOM, postural control, sabina sit to stand if able; Continue with progressive LE Strengthening    PT Home Exercise Plan No changes to HEP today    Consulted and Agree with Plan of Care Patient;Family member/caregiver    Family Member Consulted Dad               Lewis Moccasin, PT 03/22/2022, 8:36 AM

## 2022-03-23 ENCOUNTER — Ambulatory Visit: Payer: BC Managed Care – PPO | Admitting: Occupational Therapy

## 2022-03-23 ENCOUNTER — Ambulatory Visit: Payer: BC Managed Care – PPO

## 2022-03-23 DIAGNOSIS — R2681 Unsteadiness on feet: Secondary | ICD-10-CM | POA: Diagnosis not present

## 2022-03-23 DIAGNOSIS — H543 Unqualified visual loss, both eyes: Secondary | ICD-10-CM | POA: Diagnosis not present

## 2022-03-23 DIAGNOSIS — R2689 Other abnormalities of gait and mobility: Secondary | ICD-10-CM | POA: Diagnosis not present

## 2022-03-23 DIAGNOSIS — R278 Other lack of coordination: Secondary | ICD-10-CM | POA: Diagnosis not present

## 2022-03-23 DIAGNOSIS — R269 Unspecified abnormalities of gait and mobility: Secondary | ICD-10-CM

## 2022-03-23 DIAGNOSIS — R262 Difficulty in walking, not elsewhere classified: Secondary | ICD-10-CM | POA: Diagnosis not present

## 2022-03-23 DIAGNOSIS — I6930 Unspecified sequelae of cerebral infarction: Secondary | ICD-10-CM

## 2022-03-23 DIAGNOSIS — M6281 Muscle weakness (generalized): Secondary | ICD-10-CM

## 2022-03-23 NOTE — Therapy (Signed)
OUTPATIENT PHYSICAL THERAPY TREATMENT NOTE   Patient Name: Paul Hall MRN: [id number] DOB:[birth year], 26 y.o., male Today's Date: 03/24/2022  PCP:  Loistine Chance PROVIDER:  Collene Mares, PA-C   PT End of Session - 03/24/22 0853     Visit Number 70    Number of Visits 81    Date for PT Re-Evaluation 06/01/22    Authorization Type [employer] and [employer]- Need auth past 12th visit    Authorization Time Period 01/04/21-03/29/21; Recert 19/14/7829-5/62/1308; Recert 6/57/8469- 09/04/9526; Recert 07/16/2438-01/04/7252    Progress Note Due on Visit 99    PT Start Time 1603    PT Stop Time 1644    PT Time Calculation (min) 41 min    Equipment Utilized During Treatment Gait belt   Hoyer lift   Activity Tolerance Patient tolerated treatment well   queasiness/nausea   Behavior During Therapy WFL for tasks assessed/performed                     Past Medical History:  Diagnosis Date   Diabetes mellitus ([place])    Hypertension    Stroke ([place])    Past Surgical History:  Procedure Laterality Date   IVC FILTER PLACEMENT ([place] [facility])     LEG SURGERY     PEG TUBE PLACEMENT     TRACHEOSTOMY     Patient Active Problem List   Diagnosis Date Noted   Sepsis due to vancomycin resistant Enterococcus species ([place]) 06/06/2019   SIRS (systemic inflammatory response syndrome) ([place]) 06/05/2019   Acute lower UTI 06/05/2019   VRE (vancomycin-resistant Enterococci) infection 06/05/2019   Anemia 06/05/2019   Skin ulcer of sacrum with necrosis of muscle ([place])    Urinary retention    Type 2 diabetes mellitus without complication, with long-term current use of insulin ([facility])    Tachycardia    Lower extremity edema    Acute metabolic encephalopathy    Obstructive sleep apnea    Morbid obesity with BMI of 60.0-69.9, adult ([place])    Goals of care, counseling/discussion    Palliative care encounter    Sepsis ([place]) 04/27/2019   H/O insulin dependent diabetes mellitus 04/27/2019    History of CVA with residual deficit 04/27/2019   Seizure disorder ([place]) 04/27/2019   Decubitus ulcer of sacral region, stage 4 ([place]) 04/27/2019    REFERRING DIAG: Cerebral infarction, unspecified   THERAPY DIAG:  Muscle weakness (generalized)  Other lack of coordination  Difficulty in walking, not elsewhere classified  History of CVA with residual deficit  Unsteadiness on feet  Abnormality of gait and mobility  Other abnormalities of gait and mobility  Rationale for Evaluation and Treatment Rehabilitation  PERTINENT HISTORY: Kendarious Gudino is a 25yoM who presents with severe weakness, quadriparesis, altered sensorium, and visual impairment s/p critical illness and prolonged hospitalization. Pt hospitalized in October 2020 with ARDS 2/2 COVID19 infection. Pt sustained a complex and lengthy hospitalization which included tracheostomy, prolonged sedation, ECMO. In this period pt sustained CVA and SDH. Pt has now been liberated from tracheostomy and G-tube. Pt has since been hospitalized for wound infection and UTI. Pt lives with parents at home, has hospital bed and left chair, hoyer lift transfers, and power WC for mobility needs. Pt needs heavy physical assistance with ADL 2/2 BUE contractures and motor dysfunction   PRECAUTIONS: Fall  SUBJECTIVE: Pt reports doing okay and request that PT instruct mother in the LE strengthening so she can help him more at home with his current home program.  Patient reports PAIN:  Are you having pain? No   TODAY'S TREATMENT:     THEREX:   Instructed mother in basic LE stretching and strengthening regimen that can be performed some in supine and some in seated.   Discussed importance of gluteals for standing- instructed in glute sets (seated today since patient in chair) but also demo supine and patient performed 5 sec hold x10. Verbally discussed how to perform bridging in supine at home and how caregiver can physically hold at  feet/ankles if needed for support.   Discussed importance of quads and instructed in long sit quad sets (also PT demo supine) and patient performed with 5 sec hold x 10 reps each LE.  Patient then perform 15 reps x 2 of LAQ Patient then performed 5 reps each of Eccentric LAQ  Discussed importance of Hip strength for stabilization with standing-  Patient performed 15 reps of 5 sec hold - hip add Patient performed 15 reps of manually resistive Hip abd.    PATIENT EDUCATION: Education details: posture cues with sitting Person educated: Patient Education method: Explanation, Demonstration, Tactile cues, and Verbal cues Education comprehension: verbalized understanding, returned demonstration, verbal cues required, tactile cues required, and needs further education   HOME EXERCISE PROGRAM:  Access Code: [email or web address] URL: [email or web address] Date: 03/23/2022 Prepared by: Sande Brothers  Exercises - Supine Bridge  - 3 x weekly - 3 sets - 10 reps - 2 hold - Supine Gluteal Sets  - 3 x weekly - 3 sets - 10 reps - 5 sec hold - Supine Quad Set  - 1 x daily - 3 x weekly - 3 sets - 10 reps - 5 hold - Seated Long Arc Quad  - 1 x daily - 7 x weekly - 3 sets - 10 reps - Seated Hip Adduction Squeeze with Ball  - 1 x daily - 3 x weekly - 3 sets - 10 reps - 5 hold - Seated Hip Abduction  - 1 x daily - 3 x weekly - 3 sets - 10 reps - 2 hold  PT Short Term Goals -       PT SHORT TERM GOAL #1   Title Pt will be independent with HEP in order to improve strength and balance in order to decrease fall risk and improve function at home and work.    Baseline 01/04/2021= No formal HEP in place; 12/12 no HEP in place; 05/10/2021-Patient and his father were able to report compliance with curent HEP consisting of mostly seated/reclined LE strengthening. Both verbalize no questions at this time.    Time 6    Period Weeks    Status Achieved    Target Date 02/15/21              PT Long Term  Goals - Date for completion: 06/01/2022         PT LONG TERM GOAL #1   Title Patient will increase BLE gross strength by 1/2 muscle grade to improve functional strength for improved independence with potential gait, increased standing tolerance and increased ADL ability.    Baseline 01/04/2021- Patient presents with 1/5 to 3-/5 B LE strength with MMT; 12/12: goal partially met for Left knee/hip; 05/10/2021= 2-/5 bilateal Hip flex; 3+/5 bilateral Knee ext; 06/21/2021= Patient presents with 2-/5 bilateral Hip flex; 3+/5 bilateral knee ext/flex; 2-/5 left ankle DF; 0/5 right ankle- and able to increase reps and resistance with LE's. 09/15/2021- Patient technically presents with 2-/5 B hip flex/abd/add - but he is  able to raise his hip up to approx 100 deg which has improved. 3+/5 Bilateral knee ext, 2-/5 left ankle and 0/5 right ankle.  12/08/2021= Patient able to lift left knee at 110 deg of hip flex; presents with 3+/5 knee ext, 2-/5 left ankle DF and 0/5 right ankle DF, 2-/5 bilateral Hip abd in seated position.   12/6: R: knee 3+/5 ext, 2/5 flexion, left knee 3+/5 extension, 3+/5 flexion, R hip: 2+/5 hip add, 2+/5 hip ABD L hip: 4-/5 hip ABD, 3+/5 hip ADD, 3+/5 hip flexion    Time 12    Period Weeks    Status Partially met, met with exception of ankle musculature on the R LE    Target Date 03/07/2022     PT LONG TERM GOAL #2   Title Patient will tolerate sitting unsupported demonstrating erect sitting posture for 15 minutes with CGA to demonstrate improved back extensor strength and improved sitting tolerance.    Baseline 01/04/2021- Patient confied to sitting in lift chair or electric power chair with back support and unable to sit upright without physical assistance; 12/12: tolerates <1 minutes upright unsupported sitting. 05/10/2021=static sit with forward trunk lean  in his power wheelchair without back support x approx 3 min. 06/21/2021=Unable to assess today due to patient with acute back pain but on  previous visit able to sit x 8 min without back support. 09/15/2021- on last visit- 09/13/2021- patient was able to sit unsupported x 8 min at edge of mat. 10/13/2023 - Patient was able to sit at edge of mat with varying level of assist today from SBA to min A for a total of 20 min. 12/13/2021= Patient demonstrated unsupported sitting at edge of mat for approx 20 min   Time 12    Period Weeks    Status GOAL MET   Target Date 12/08/21      PT LONG TERM GOAL #3   Title Patient will demonstrate ability to perform static standing in // bars > 2 min with Max Assist  without loss of balance and fair posture for improved overall strength for pre-gait and transfer activities.    Baseline 01/04/2021= Patient current uanble to stand- Dependent on hoyer or sit to stand lift for transfers. 05/10/2021=Not appropriate yet- Currently still dependent with all transfers using hoyer. 06/21/2021= Patient continuing now to focus on LE strengthening to prepare for standing-unable to try today due to acute low back pain-  planning on attempting in new cert period. 09/15/2021- Patient has attempted standing 2x in past two week- max Assist of 2 people - only once was he successful to clearing his bottom from chair - Will continue to be a focus during the new certification. 12/13/2021= Patient has been limited secondary to increased overall low back pain during this certification and will require more time to focus on this goal.  12/6: not assessed this date, will assess at date when 2-3 PTs are present for assistance    Time 12    Period Weeks    Status On-going          PT LONG TERM GOAL #4   Title Pt will improve FOTO score by 10 points or more demonstrating improved perceived functional ability    Baseline FOTO 7 on 10/17; 03/15/21: FOTO 12; 05/10/2021 06/21/2021= 1; 09/15/2021= 9; 12/13/2021= Will issue next visit 12/6: 4   Time 12    Period Weeks    Status Not Met          PT  LONG TERM GOAL #5   Title Patient will perform sit  to stand transfer with appropriate AD and max assist of 2 people with 75% consistency to prepare for pregait activities.    Baseline 09/15/2021= Patient unable to stand well- unable to clear his bottom off chair with Max assist of 2 persons. 12/13/2021- Goal not appropriate to try yet but will keep and roll over to next cert as shift continues to focus on transfers/standing   Time 12    Period Weeks    Status New        PT LONG TERM GOAL #6  Title Patient will tolerate sitting unsupported demonstrating erect sitting posture for 30 minutes with CGA to demonstrate improved back extensor strength and improved sitting tolerance.   Baseline 12/13/2021= Patient demonstrated unsupported sitting at edge of mat for approx 20 min;  12/29/2021- Patient performed approx 30 min of dynamic sitting activities today. 12/6:   Time 12   Period Weeks   Status ONGOING         7.  Patient will tolerate 5 minutes or more of standing in sit to stand lift in order to indicate improved lower extremity weightbearing tolerance for progression to standing in parallel bars. Baseline: 1 minute on most recent stand 02/21/22 Goal status: INITIAL       Plan     Clinical Impression Statement Patient presented with good motivation for today's visit and requested to review some of LE strengthening so his mother could watch and help him more at home. Today's visit focused on educating both patient and mother on rationale for strengthening each muscle group and the role each muscle plays in transfers/standing/walking. Both patient and mother verbalized good understanding of HEP and issued HEP handout to mother at end of session.  Pt will continue to benefit from skilled physical therapy intervention to address impairments, improve QOL, and attain therapy goals.     Personal Factors and Comorbidities Comorbidity 3+;Time since onset of injury/illness/exacerbation    Comorbidities CVA, diabetes, Seizures    Examination-Activity  Limitations Bathing;Bed Mobility;Bend;Caring for Others;Carry;Dressing;Hygiene/Grooming;Lift;Locomotion Level;Reach Overhead;Self Feeding;Sit;Squat;Stairs;Stand;Transfers;Toileting    Examination-Participation Restrictions Cleaning;Community Activity;Driving;Laundry;Medication Management;Meal Prep;Occupation;Personal Finances;Shop;Yard Work;Volunteer    Stability/Clinical Decision Making Evolving/Moderate complexity    Rehab Potential Fair    PT Frequency 2x / week    PT Duration 12 weeks    PT Treatment/Interventions ADLs/Self Care Home Management;Cryotherapy;Electrical Stimulation;Moist Heat;Ultrasound;DME Instruction;Gait training;Stair training;Functional mobility training;Therapeutic exercise;Balance training;Patient/family education;Orthotic Fit/Training;Neuromuscular re-education;Wheelchair mobility training;Manual techniques;Passive range of motion;Dry needling;Energy conservation;Taping;Visual/perceptual remediation/compensation;Joint Manipulations    PT Next Visit Plan core strength- sitting at EOM, postural control, sabina sit to stand if able; Continue with progressive LE Strengthening    PT Home Exercise Plan No changes to HEP today    Consulted and Agree with Plan of Care Patient;Family member/caregiver    Family Member Consulted Dad               Lewis Moccasin, PT 03/24/2022, 9:04 AM

## 2022-03-24 ENCOUNTER — Encounter: Payer: Self-pay | Admitting: Occupational Therapy

## 2022-03-24 NOTE — Therapy (Signed)
Occupational Therapy Progress Note  Dates of reporting period  01/17/2022   to   03/24/2022   Patient Name: Paul Hall MRN: [id number] DOB:Sep 09, 1995, 26 y.o., male Today's Date: 03/24/2022  PCP: Dr. Sherwood Gambler REFERRING PROVIDER: Dr. Sherwood Gambler   OT End of Session - 03/24/22 1034     Visit Number 10    Number of Visits 12    Date for OT Re-Evaluation 06/13/22    OT Start Time 1645    OT Stop Time 1745    OT Time Calculation (min) 60 min    Activity Tolerance Patient tolerated treatment well    Behavior During Therapy WFL for tasks assessed/performed                     Past Medical History:  Diagnosis Date   Diabetes mellitus (HCC)    Hypertension    Stroke [facility])    Past Surgical History:  Procedure Laterality Date   IVC FILTER PLACEMENT ([facility] HX)     LEG SURGERY     PEG TUBE PLACEMENT     TRACHEOSTOMY     Patient Active Problem List   Diagnosis Date Noted   Sepsis due to vancomycin resistant Enterococcus species (HCC) 06/06/2019   SIRS (systemic inflammatory response syndrome) (HCC) 06/05/2019   Acute lower UTI 06/05/2019   VRE (vancomycin-resistant Enterococci) infection 06/05/2019   Anemia 06/05/2019   Skin ulcer of sacrum with necrosis of muscle (HCC)    Urinary retention    Type 2 diabetes mellitus without complication, with long-term current use of insulin (HCC)    Tachycardia    Lower extremity edema    Acute metabolic encephalopathy    Obstructive sleep apnea    Morbid obesity with BMI of 60.0-69.9, adult (HCC)    Goals of care, counseling/discussion    Palliative care encounter    Sepsis (HCC) 04/27/2019   H/O insulin dependent diabetes mellitus 04/27/2019   History of CVA with residual deficit 04/27/2019   Seizure disorder (HCC) 04/27/2019   Decubitus ulcer of sacral region, stage 4 (HCC) 04/27/2019    ONSET DATE: 01/2019  REFERRING DIAG: CVA/COVID-19  THERAPY DIAG:  Muscle weakness (generalized)  Rationale for Evaluation and  Treatment Rehabilitation  SUBJECTIVE:   Patient is looking forward to a wrestling event in March.  SUBJECTIVE STATEMENT:  Pt. reports  doing well today.  Pt accompanied by: self and family member  PERTINENT HISTORY:  Pt. is a 26 y.o. male who was diagnosed with COVID-19, and CVA with resultant quadriplegia. Pt. Was then hospitalized with VRE UTI. PMHx includes: urinary retention, seizure disorder, obstructive sleep disorder, DM Type II, Morbid obesity.   PRECAUTIONS: None  WEIGHT BEARING RESTRICTIONS No  PAIN:  Are you having pain? No  FALLS: Has patient fallen in last 6 months? No  LIVING ENVIRONMENT: Lives with: lives with their family Lives in: House/apartment Stairs: No Level Entry Has following equipment at home: Wheelchair (power) and hoyer lift, sit to stand lift  PLOF: Independent  PATIENT GOALS: To be able to engage in more daily care tasks.  OBJECTIVE:   HAND DOMINANCE: Right  ADLs:  Transfers/ambulation related to ADLs: Eating: Pt. Reports being able to hold standard utensils, and is starting to engage more in self-feeding tasks, hand to mouth patterns. Pt. Reports that he does as much as he can with the task, and family assists  with the remainder of the task. Grooming: Pt. Is able to initiate holding an electric toothbrush, and brush his  teeth. Family assists LB Dressing: Total Assist UE dressing: Pt. is now able to reach up to actively assist with grasping , and pulling his gown down. Toileting:  Total Assist Bathing: MaxA UB, Total assist LB Tub Shower transfers: N/A Equipment: See above    IADLs: Shopping: Relies on family to assist Light housekeeping: Total Assist Meal Prep: Total Assist Community mobility:   Medication management:  Total Assist  Financial management: N/A Handwriting: Not legible: Pt. Is able to hold a pen with the left hand, and initiate marking the page. Pt.'s eye glasses were not available  MOBILITY STATUS:  Power  w/c  POSTURE COMMENTS:  Pt. Requires position changes in his power w/c  ACTIVITY TOLERANCE: Activity tolerance:  Fair  FUNCTIONAL OUTCOME MEASURES: FOTO: 40  TR score: 45  UPPER EXTREMITY ROM     Active ROM Right eval Right 03/21/2022  Left eval Left  03/21/2022  Shoulder flexion 106 scaption 105  Scaption  119 123  Shoulder abduction 114 90  110 115  Shoulder adduction       Shoulder extension       Shoulder internal rotation       Shoulder external rotation       Elbow flexion 120(130) 145  135(135) 145  Elbow extension -45(-35) -22(-35)  -27(-18) -22(145)  Wrist flexion       Wrist extension -30(10) -25(10)  10(50 19(50)  Wrist ulnar deviation       Wrist radial deviation       Wrist pronation       Wrist supination       (Blank rows = not tested)      UPPER EXTREMITY MMT:     Right eval Right 03/21/2022  Left eval Left 03/21/2022  Shoulder flexion 3-/5 scaption 3-/5  3-/5 3/5  Shoulder abduction 3-/5 3-/5  3-/5 3-/5  Shoulder adduction       Shoulder extension       Shoulder internal rotation       Shoulder external rotation       Elbow flexion 3/5 3/5  3/5 3+/5  Elbow extension 2-/5 2/5  2-/5 2/5  Wrist flexion       Wrist extension 2-/5 2-/5  2/5 2+/5  Wrist ulnar deviation       Wrist radial deviation       Wrist pronation       Wrist supination       (Blank rows = not tested)     HAND FUNCTION:  Grip strength: Right: 0 lbs; Left: 0 lbs and Lateral pinch: Right: 5 lbs, Left: 2 lbs  03/21/2022: Lateral pinch: Right: 3.5 lbs, Left: 2 lbs  Bilateral digit PIP/DIP flexion contractures with MP hyperextension with attempts for AROM. Pt. is able to tolerate AROM to the bilateral digits at the initial evaluation however, has a history of pain in the digits.  COORDINATION:  Eval: Pt. is unable to grasp 9-hole test pegs. Pt. is able to initiate grasping larger pegs, and is able to hold a pen in the left hand.  SENSATION: Light touch: Impaired    EDEMA:  N/A  MUSCLE TONE: BUE flexor Spasticity  COGNITIO Overall cognitive status: Continue to assess in functional context  VISION:   Subjective report: Pt. Was not wearing glasses at the time of the initial eval.  Baseline vision: Vision is very limited. Wears glasses all the time Visual history: History of impaired vision following CVA. Pt. Has received treatment through the [facility] low  vision rehabilitation program.   VISION ASSESSMENT: Impaired To be further assessed in functional context  PERCEPTION: Impaired   PRAXIS: Impaired: motor planning  OBSERVATIONS:  Pt reports being on  Tramadol   TODAY'S TREATMENT:   Pt. worked on navigating the w/c throughout the gym. Pt. Required max cues for scanning through the environment to negotiate, and maneuver through smaller spaces around obstacles. Pt. worked on maneuvering the w/c through  doorways, and turning in the hallway. Pt. Required moderate cuing with increased time for hand placement on the controls. Pt. was able to consistently modulate the appropriate amount of force through the w/c control with the left hand.  Pt. finger placement was assessed for grasping a mug, and performing the hand to mouth pattern. Pt. requires assist underneath to stabilize the mug, and to adjust the amount of assist needed.  Pt. Recertification was performed last treatment session. Pt. Has made progress over this past progress reporting period, and plans to continue to continue with the same POC upgraded last session  with an additional goals added for navigating through his environment, as well as new environments with the w/c.   Marland Kitchen  PATIENT EDUCATION: Education details: OT services, POC, Goals Person educated: Patient and Parent Education method: Explanation, Demonstration, Tactile cues, and Verbal cues Education comprehension: verbalized understanding, returned demonstration, verbal cues required, and needs further education   HOME EXERCISE  PROGRAM:   Continue ongoing assessment, and continue to provide as needed.     GOALS: Goals reviewed with patient? Yes  SHORT TERM GOALS: Target date:05/03/2022     To assess splint fit, and make appropriate adjustments to promote good skin integrity through the palmar surface of the bilateral hands.  Baseline: 03/23/2022: Pt. is wearing splints a couple of hours at night bilateral resting hand splints. 03/21/2022: Pt. is wearing splints a couple of hours at night bilateral resting hand splints. Goal status: Continue ongoing assessment as requested, and as needed   LONG TERM GOALS: Target date: 06/13/2022     FOTO score will Improve by 2 points for Pt. perceived improvement with the assessment specific ADL/IADL tasks.  Baseline: Eval: FOTO score: 40,  TR score: 45 Goal status: Ongoing  2.   Pt. will independently perform oral care for 100% of the task after complete set-up. Baseline:03/23/2022: Pt. Is able to initiate and perform oral care for approximately 75% of the task. Pt. Requires assist at proximally at the elbow for through oral care. 03/21/2022: Pt. Is able to initiate and perform oral care for approximately 75% of the task. Pt. Requires assist at proximally at the elbow for through oral care. Eval: Pt. is able to initiate using an electric toothbrush. Pt. requires assist for set-up, and assist for thoroughness, and as he Pt. fatigues. Goal status: Ongoing  3.  Pt. Will be independent with self-feeding for 100% of the meal after complete set-up Baseline: 03/23/2022: Pt. Is able to perform scooping cereal for 75% of the time. Pt. required assist, and support at the left elbow, and Pt. Presents with limited forearm supination when using the spoon, and bringing it towards his mouth. Pt. Is able to use a fork to spear items, and perform the hand to mouth pattern.  03/21/2022: Pt. Is able to perform scooping cereal for 75% of the time. Pt. required assist, and support at the left  elbow, and Pt. Presents with limited forearm supination when using the spoon, and bringing it towards his mouth. Pt. Is able to use a fork to  spear items, and perform the hand to mouth pattern.  Eval: Pt. is able to hold standard standard utensils. Pt. Performs as much of the task as he, can and has assistance for the remainder. Goal status: Ongoing  4.  Pt. Will improve grasp patterns and consistently grasp 1/2" objects for ADL, and IADL tasks.  Baseline: 03/23/2022: Pt. Is able to grasp 1" objects consistently,and continues to work on the hand patterns needed to grasp 1/2" objects.03/21/2022: Pt. Is able to grasp 1" objects consistently,and continues to work on the hand patterns needed to grasp 1/2" objects. Eval: Pt. is able to grasp 1" objects intermittently using a lateral grasp pattern. Goal status: Ongoing  5.  Pt. will independently write his name legibly with letter sizes under 2". Baseline: 03/23/2022: Pt. Is able to write his name with modA, however has difficulty with formulating letter sizes less than 2" in size with 50% legibility for the 3 letters of his name.03/21/2022: Pt. Is able to write his name with modA, however has difficulty with formulating letter sizes less than 2" in size with 50% legibility for the 3 letters of his name. Eval: Pt is able to hold a thin marker with his left hand, and formulate a line, and initiate a circular pattern (Pt. without glasses today) Goal status: Ongoing  6. Pt. Will reach up to comb/brush his hair  with minA.  Baseline: 03/23/2022: Pt. is now able to more consistently initiate reaching up to his head with his left hand in preparation for haircare12/18/2023: Pt. is now able to more consistently initiate reaching up to his head with his left hand in preparation for haircare. Eval: Pt. is able to initiate reaching up for hair care with a long handled brush, however is unable to sustain UE's in elevation to perform the task.     Goal status:  Ongoing  7. Pt. Will independently navigate the w/c through his environment with minA with visual scanning, and hand placement on the controls.  Baseline: Pt. Requires max cues for scanning through the environment, and moderate cues for hand placement on the controls.     Goal status:New       ASSESSMENT:  CLINICAL IMPRESSION:  Pt. worked on navigating the w/c throughout the gym. Pt. Required max cues for scanning through the environment to negotiate, and maneuver through smaller spaces around obstacles. Pt. worked on maneuvering the w/c through  doorways, and turning in the hallway. Pt. Required moderate cuing with increased time for hand placement on the controls. Pt. was able to consistently modulate the appropriate amount of force through the w/c control with the left hand.  Pt. finger placement was assessed for grasping a mug, and performing the hand to mouth pattern. Pt. requires assist underneath to stabilize the mug, and to adjust the amount of assist needed.  Pt. Recertification was performed last treatment session. Pt. Has made progress over this past progress reporting period, and plans to continue to continue with the same POC upgraded last session  with an additional goals added for navigating through his environment, as well as new environments with the w/c.Marland Kitchen  PERFORMANCE DEFICITS in functional skills including ADLs, IADLs, coordination, dexterity, proprioception, ROM, strength, pain, FMC, GMC, decreased knowledge of use of DME, and UE functional use, cognitive skills including safety awareness, and psychosocial skills including coping strategies, environmental adaptation, habits, and routines and behaviors.   IMPAIRMENTS are limiting patient from ADLs, IADLs, leisure, and social participation.   COMORBIDITIES may have co-morbidities  that affects  occupational performance. Patient will benefit from skilled OT to address above impairments and improve overall  function.  MODIFICATION OR ASSISTANCE TO COMPLETE EVALUATION: Maximum or significant modification of tasks or assist is necessary to complete an evaluation.  OT OCCUPATIONAL PROFILE AND HISTORY: Comprehensive assessment: Review of records and extensive additional review of physical, cognitive, psychosocial history related to current functional performance.  CLINICAL DECISION MAKING: High - multiple treatment options, significant modification of task necessary  REHAB POTENTIAL: Fair    EVALUATION COMPLEXITY: High    PLAN: OT FREQUENCY: 2 x's a week  OT DURATION:12 weeks  PLANNED INTERVENTIONS: self care/ADL training, therapeutic exercise, therapeutic activity, neuromuscular re-education, manual therapy, passive range of motion, patient/family education, and cognitive remediation/compensation  RECOMMENDED OTHER SERVICES: PT  CONSULTED AND AGREED WITH PLAN OF CARE: Patient and family member/caregiver  PLAN FOR NEXT SESSION: Initiate treatment   Olegario MessierElaine Charlei Ramsaran, MS, OTR/L   Olegario MessierElaine Gerrie Castiglia, OT 03/24/2022, 10:37 AM

## 2022-03-24 NOTE — Addendum Note (Signed)
Addended by: Avon Gully on: 03/24/2022 11:00 AM   Modules accepted: Orders

## 2022-03-30 ENCOUNTER — Ambulatory Visit: Payer: BC Managed Care – PPO | Admitting: Occupational Therapy

## 2022-03-30 ENCOUNTER — Ambulatory Visit: Payer: BC Managed Care – PPO

## 2022-03-30 DIAGNOSIS — M6281 Muscle weakness (generalized): Secondary | ICD-10-CM

## 2022-03-30 DIAGNOSIS — R278 Other lack of coordination: Secondary | ICD-10-CM

## 2022-03-30 DIAGNOSIS — H543 Unqualified visual loss, both eyes: Secondary | ICD-10-CM

## 2022-03-30 DIAGNOSIS — R2681 Unsteadiness on feet: Secondary | ICD-10-CM

## 2022-03-30 DIAGNOSIS — I6930 Unspecified sequelae of cerebral infarction: Secondary | ICD-10-CM

## 2022-03-30 DIAGNOSIS — R262 Difficulty in walking, not elsewhere classified: Secondary | ICD-10-CM | POA: Diagnosis not present

## 2022-03-30 DIAGNOSIS — R269 Unspecified abnormalities of gait and mobility: Secondary | ICD-10-CM

## 2022-03-30 DIAGNOSIS — R2689 Other abnormalities of gait and mobility: Secondary | ICD-10-CM

## 2022-03-31 ENCOUNTER — Encounter: Payer: Self-pay | Admitting: Occupational Therapy

## 2022-03-31 NOTE — Therapy (Signed)
Occupational Therapy Treatment Note  Patient Name: Paul Hall MRN: [id number] DOB:[birth year], 26 y.o., male Today's Date: 03/31/2022  PCP: Dr. Gerarda Fraction REFERRING PROVIDER: Dr. Gerarda Fraction   OT End of Session - 03/31/22 0905     Visit Number 11    Number of Visits 24    Date for OT Re-Evaluation 06/13/22    Authorization Type Progress report period starting 10/06/2021    OT Start Time 1605    OT Stop Time 1650    OT Time Calculation (min) 45 min    Activity Tolerance Patient tolerated treatment well    Behavior During Therapy WFL for tasks assessed/performed                     Past Medical History:  Diagnosis Date   Diabetes mellitus ([place])    Hypertension    Stroke [facility])    Past Surgical History:  Procedure Laterality Date   IVC FILTER PLACEMENT ([place] HX)     LEG SURGERY     PEG TUBE PLACEMENT     TRACHEOSTOMY     Patient Active Problem List   Diagnosis Date Noted   Sepsis due to vancomycin resistant Enterococcus species ([place]) 06/06/2019   SIRS (systemic inflammatory response syndrome) ([place]) 06/05/2019   Acute lower UTI 06/05/2019   VRE (vancomycin-resistant Enterococci) infection 06/05/2019   Anemia 06/05/2019   Skin ulcer of sacrum with necrosis of muscle ([place])    Urinary retention    Type 2 diabetes mellitus without complication, with long-term current use of insulin ([place])    Tachycardia    Lower extremity edema    Acute metabolic encephalopathy    Obstructive sleep apnea    Morbid obesity with BMI of 60.0-69.9, adult ([place])    Goals of care, counseling/discussion    Palliative care encounter    Sepsis ([place]) 04/27/2019   H/O insulin dependent diabetes mellitus 04/27/2019   History of CVA with residual deficit 04/27/2019   Seizure disorder ([place]) 04/27/2019   Decubitus ulcer of sacral region, stage 4 ([place]) 04/27/2019    ONSET DATE: 01/2019  REFERRING DIAG: CVA/COVID-19  THERAPY DIAG:  Muscle weakness (generalized)  Rationale for Evaluation  and Treatment Rehabilitation  SUBJECTIVE:   Patient is looking forward to a wrestling event in March.  SUBJECTIVE STATEMENT:  Pt. reports  doing well today.  Pt accompanied by: self and family member  PERTINENT HISTORY:  Pt. is a 26 y.o. male who was diagnosed with COVID-19, and CVA with resultant quadriplegia. Pt. Was then hospitalized with VRE UTI. PMHx includes: urinary retention, seizure disorder, obstructive sleep disorder, DM Type II, Morbid obesity.   PRECAUTIONS: None  WEIGHT BEARING RESTRICTIONS No  PAIN:  Are you having pain? No  FALLS: Has patient fallen in last 6 months? No  LIVING ENVIRONMENT: Lives with: lives with their family Lives in: House/apartment Stairs: No Level Entry Has following equipment at home: Wheelchair (power) and hoyer lift, sit to stand lift  PLOF: Independent  PATIENT GOALS: To be able to engage in more daily care tasks.  OBJECTIVE:   HAND DOMINANCE: Right  ADLs:  Transfers/ambulation related to ADLs: Eating: Pt. Reports being able to hold standard utensils, and is starting to engage more in self-feeding tasks, hand to mouth patterns. Pt. Reports that he does as much as he can with the task, and family assists  with the remainder of the task. Grooming: Pt. Is able to initiate holding an electric toothbrush, and brush his teeth. Family assists LB  Dressing: Total Assist UE dressing: Pt. is now able to reach up to actively assist with grasping , and pulling his gown down. Toileting:  Total Assist Bathing: MaxA UB, Total assist LB Tub Shower transfers: N/A Equipment: See above    IADLs: Shopping: Relies on family to assist Light housekeeping: Total Assist Meal Prep: Total Assist Community mobility:   Medication management:  Total Assist  Financial management: N/A Handwriting: Not legible: Pt. Is able to hold a pen with the left hand, and initiate marking the page. Pt.'s eye glasses were not available  MOBILITY STATUS:  Power  w/c  POSTURE COMMENTS:  Pt. Requires position changes in his power w/c  ACTIVITY TOLERANCE: Activity tolerance:  Fair  FUNCTIONAL OUTCOME MEASURES: FOTO: 40  TR score: 45  UPPER EXTREMITY ROM     Active ROM Right eval Right 03/21/2022  Left eval Left  03/21/2022  Shoulder flexion 106 scaption 105  Scaption  119 123  Shoulder abduction 114 90  110 115  Shoulder adduction       Shoulder extension       Shoulder internal rotation       Shoulder external rotation       Elbow flexion 120(130) 145  135(135) 145  Elbow extension -45(-35) -22(-35)  -27(-18) -22(145)  Wrist flexion       Wrist extension -30(10) -25(10)  10(50 19(50)  Wrist ulnar deviation       Wrist radial deviation       Wrist pronation       Wrist supination       (Blank rows = not tested)      UPPER EXTREMITY MMT:     Right eval Right 03/21/2022  Left eval Left 03/21/2022  Shoulder flexion 3-/5 scaption 3-/5  3-/5 3/5  Shoulder abduction 3-/5 3-/5  3-/5 3-/5  Shoulder adduction       Shoulder extension       Shoulder internal rotation       Shoulder external rotation       Elbow flexion 3/5 3/5  3/5 3+/5  Elbow extension 2-/5 2/5  2-/5 2/5  Wrist flexion       Wrist extension 2-/5 2-/5  2/5 2+/5  Wrist ulnar deviation       Wrist radial deviation       Wrist pronation       Wrist supination       (Blank rows = not tested)     HAND FUNCTION:  Grip strength: Right: 0 lbs; Left: 0 lbs and Lateral pinch: Right: 5 lbs, Left: 2 lbs  03/21/2022: Lateral pinch: Right: 3.5 lbs, Left: 2 lbs  Bilateral digit PIP/DIP flexion contractures with MP hyperextension with attempts for AROM. Pt. is able to tolerate AROM to the bilateral digits at the initial evaluation however, has a history of pain in the digits.  COORDINATION:  Eval: Pt. is unable to grasp 9-hole test pegs. Pt. is able to initiate grasping larger pegs, and is able to hold a pen in the left hand.  SENSATION: Light touch: Impaired    EDEMA:  N/A  MUSCLE TONE: BUE flexor Spasticity  COGNITIO Overall cognitive status: Continue to assess in functional context  VISION:   Subjective report: Pt. Was not wearing glasses at the time of the initial eval.  Baseline vision: Vision is very limited. Wears glasses all the time Visual history: History of impaired vision following CVA. Pt. Has received [facility] low vision [facility].  VISION ASSESSMENT: Impaired To be further assessed in functional context  PERCEPTION: Impaired   PRAXIS: Impaired: motor planning  OBSERVATIONS:  Pt reports being on Tramadol   TODAY'S TREATMENT:   Self-care:  Pt. worked on navigating his w/c from the PT gym to the [employer], and through the doorway at the end of the session. Pt. worked on hand to mouth patterns using his cup from home. Various cup handles were assessed for grip, and cup stabilization.  Neuromuscular re-education:  Pt. worked on Manufacturing engineer style discs from the tabletop surface with his right hand. Dycem was utilized to promote a more stable surface. Pt. Worked on reaching to place the discs into a container placed at multiple vertical planes to promote right elbow extension.   PATIENT EDUCATION: Education details: OT services, POC, Goals Person educated: Patient and Parent Education method: Explanation, Demonstration, Tactile cues, and Verbal cues Education comprehension: verbalized understanding, returned demonstration, verbal cues required, and needs further education   HOME EXERCISE PROGRAM:   Continue ongoing assessment, and continue to provide as needed.     GOALS: Goals reviewed with patient? Yes  SHORT TERM GOALS: Target date:05/03/2022     To assess splint fit, and make appropriate adjustments to promote good skin integrity through the palmar surface of the bilateral hands.  Baseline: 03/23/2022: Pt. is wearing splints a couple of hours at night bilateral resting  hand splints. 03/21/2022: Pt. is wearing splints a couple of hours at night bilateral resting hand splints. Goal status: Continue ongoing assessment as requested, and as needed   LONG TERM GOALS: Target date: 06/13/2022     FOTO score will Improve by 2 points for Pt. perceived improvement with the assessment specific ADL/IADL tasks.  Baseline: Eval: FOTO score: 40,  TR score: 45 Goal status: Ongoing  2.   Pt. will independently perform oral care for 100% of the task after complete set-up. Baseline:03/23/2022: Pt. Is able to initiate and perform oral care for approximately 75% of the task. Pt. Requires assist at proximally at the elbow for through oral care. 03/21/2022: Pt. Is able to initiate and perform oral care for approximately 75% of the task. Pt. Requires assist at proximally at the elbow for through oral care. Eval: Pt. is able to initiate using an electric toothbrush. Pt. requires assist for set-up, and assist for thoroughness, and as he Pt. fatigues. Goal status: Ongoing  3.  Pt. Will be independent with self-feeding for 100% of the meal after complete set-up Baseline: 03/23/2022: Pt. Is able to perform scooping cereal for 75% of the time. Pt. required assist, and support at the left elbow, and Pt. Presents with limited forearm supination when using the spoon, and bringing it towards his mouth. Pt. Is able to use a fork to spear items, and perform the hand to mouth pattern.  03/21/2022: Pt. Is able to perform scooping cereal for 75% of the time. Pt. required assist, and support at the left elbow, and Pt. Presents with limited forearm supination when using the spoon, and bringing it towards his mouth. Pt. Is able to use a fork to spear items, and perform the hand to mouth pattern.  Eval: Pt. is able to hold standard standard utensils. Pt. Performs as much of the task as he, can and has assistance for the remainder. Goal status: Ongoing  4.  Pt. Will improve grasp patterns and consistently  grasp 1/2" objects for ADL, and IADL tasks.  Baseline: 03/23/2022: Pt. Is able to grasp 1" objects consistently,and  continues to work on the hand patterns needed to grasp 1/2" objects.03/21/2022: Pt. Is able to grasp 1" objects consistently,and continues to work on the hand patterns needed to grasp 1/2" objects. Eval: Pt. is able to grasp 1" objects intermittently using a lateral grasp pattern. Goal status: Ongoing  5.  Pt. will independently write his name legibly with letter sizes under 2". Baseline: 03/23/2022: Pt. Is able to write his name with modA, however has difficulty with formulating letter sizes less than 2" in size with 50% legibility for the 3 letters of his name.03/21/2022: Pt. Is able to write his name with modA, however has difficulty with formulating letter sizes less than 2" in size with 50% legibility for the 3 letters of his name. Eval: Pt is able to hold a thin marker with his left hand, and formulate a line, and initiate a circular pattern (Pt. without glasses today) Goal status: Ongoing  6. Pt. Will reach up to comb/brush his hair  with minA.  Baseline: 03/23/2022: Pt. is now able to more consistently initiate reaching up to his head with his left hand in preparation for haircare12/18/2023: Pt. is now able to more consistently initiate reaching up to his head with his left hand in preparation for haircare. Eval: Pt. is able to initiate reaching up for hair care with a long handled brush, however is unable to sustain UE's in elevation to perform the task.     Goal status: Ongoing  7. Pt. Will independently navigate the w/c through his environment with minA with visual scanning, and hand placement on the controls.  Baseline: Pt. Requires max cues for scanning through the environment, and moderate cues for hand placement on the controls.     Goal status:New       ASSESSMENT:  CLINICAL IMPRESSION:  Pt. was able to navigate his w/c from the PT gym to the OT gym, and through  the doorway to the hall with increased time, and consistent cues for vision, and scanning. Pt. reports vision changes from working with the flashing Blazepods in PT prior to OT today. Pt. required assist to set-up the mug handle within his hand, and stabilization of the mug base during hand to mouth patterns when drinking from a cup. Pt. was able to grasp the the [place] discs, and reach up to place them into the container with cues to extend his right elbow, and release the discs. Pt. required more support initially proximally at the right elbow, with the support tapering off as the task progressed as Pt. reached further. Pt. continues to work on improving BUE functioning, w/c navigation, and visual compensatory strategies to improve, and maximize independence with ADLs, and IADLs.    PERFORMANCE DEFICITS in functional skills including ADLs, IADLs, coordination, dexterity, proprioception, ROM, strength, pain, [place], GMC, decreased knowledge of use of DME, and UE functional use, cognitive skills including safety awareness, and psychosocial skills including coping strategies, environmental adaptation, habits, and routines and behaviors.   IMPAIRMENTS are limiting patient from ADLs, IADLs, leisure, and social participation.   COMORBIDITIES may have co-morbidities  that affects occupational performance. Patient will benefit from skilled OT to address above impairments and improve overall function.  MODIFICATION OR ASSISTANCE TO COMPLETE EVALUATION: Maximum or significant modification of tasks or assist is necessary to complete an evaluation.  OT OCCUPATIONAL PROFILE AND HISTORY: Comprehensive assessment: Review of records and extensive additional review of physical, cognitive, psychosocial history related to current functional performance.  CLINICAL DECISION MAKING: High - multiple treatment options,  significant modification of task necessary  REHAB POTENTIAL: Fair    EVALUATION COMPLEXITY:  High    PLAN: OT FREQUENCY: 2 x's a week  OT DURATION:12 weeks  PLANNED INTERVENTIONS: self care/ADL training, therapeutic exercise, therapeutic activity, neuromuscular re-education, manual therapy, passive range of motion, patient/family education, and cognitive remediation/compensation  RECOMMENDED OTHER SERVICES: PT  CONSULTED AND AGREED WITH PLAN OF CARE: Patient and family member/caregiver  PLAN FOR NEXT SESSION: Initiate treatment   Harrel Carina, MS, OTR/L   Harrel Carina, OT 03/31/2022, 10:23 AM

## 2022-03-31 NOTE — Therapy (Signed)
OUTPATIENT PHYSICAL THERAPY TREATMENT NOTE   Patient Name: Paul Hall MRN: [id number] DOB:08-13-1995, 26 y.o., male Today's Date: 03/31/2022  PCP:  Loistine Chance PROVIDER:  Collene Mares, PA-C   PT End of Session - 03/30/22 4431     Visit Number 22    Number of Visits 81    Date for PT Re-Evaluation 06/01/22    Authorization Type [employer] and [employer]- Need auth past 12th visit    Authorization Time Period 01/04/21-03/29/21; Recert 54/00/8676-1/95/0932; Recert 6/71/2458- 0/12/9831; Recert 11/26/537-76/10/3417    Progress Note Due on Visit 80    PT Start Time 1516    PT Stop Time 1559    PT Time Calculation (min) 43 min    Equipment Utilized During Treatment Gait belt   Hoyer lift   Activity Tolerance Patient tolerated treatment well   queasiness/nausea   Behavior During Therapy WFL for tasks assessed/performed                     Past Medical History:  Diagnosis Date   Diabetes mellitus ([place])    Hypertension    Stroke ([place])    Past Surgical History:  Procedure Laterality Date   IVC FILTER PLACEMENT ([place] [facility])     LEG SURGERY     PEG TUBE PLACEMENT     TRACHEOSTOMY     Patient Active Problem List   Diagnosis Date Noted   Sepsis due to vancomycin resistant Enterococcus species ([place]) 06/06/2019   SIRS (systemic inflammatory response syndrome) ([place]) 06/05/2019   Acute lower UTI 06/05/2019   VRE (vancomycin-resistant Enterococci) infection 06/05/2019   Anemia 06/05/2019   Skin ulcer of sacrum with necrosis of muscle ([place])    Urinary retention    Type 2 diabetes mellitus without complication, with long-term current use of insulin ([facility])    Tachycardia    Lower extremity edema    Acute metabolic encephalopathy    Obstructive sleep apnea    Morbid obesity with BMI of 60.0-69.9, adult ([place])    Goals of care, counseling/discussion    Palliative care encounter    Sepsis ([place]) 04/27/2019   H/O insulin dependent diabetes mellitus 04/27/2019    History of CVA with residual deficit 04/27/2019   Seizure disorder ([place]) 04/27/2019   Decubitus ulcer of sacral region, stage 4 ([place]) 04/27/2019    REFERRING DIAG: Cerebral infarction, unspecified   THERAPY DIAG:  Muscle weakness (generalized)  Other lack of coordination  Difficulty in walking, not elsewhere classified  History of CVA with residual deficit  Unsteadiness on feet  Abnormality of gait and mobility  Other abnormalities of gait and mobility  Low vision, both eyes  Rationale for Evaluation and Treatment Rehabilitation  PERTINENT HISTORY: Paul Hall is a 25yoM who presents with severe weakness, quadriparesis, altered sensorium, and visual impairment s/p critical illness and prolonged hospitalization. Pt hospitalized in October 2020 with ARDS 2/2 COVID19 infection. Pt sustained a complex and lengthy hospitalization which included tracheostomy, prolonged sedation, ECMO. In this period pt sustained CVA and SDH. Pt has now been liberated from tracheostomy and G-tube. Pt has since been hospitalized for wound infection and UTI. Pt lives with parents at home, has hospital bed and left chair, hoyer lift transfers, and power WC for mobility needs. Pt needs heavy physical assistance with ADL 2/2 BUE contractures and motor dysfunction   PRECAUTIONS: Fall  SUBJECTIVE: Pt reports doing okay and request that PT instruct mother in the LE strengthening so she can help him more at home with  his current home program.     Patient reports PAIN:  Are you having pain? No   TODAY'S TREATMENT:    Therapeutic activities:   PT positioned power wheelchair next to mat table and then  performed dependent hoyer transfer from w/c to adjustable mat table (positioned in the seated position) and patient was successfully positioned into seat position of mat table with +2 assist from PT and Mother.    Patient was able to transition from reclined position on mat to upright sitting with min +2  assist then able to perform static and later dynamic seated position with only SBA with good upright posture  for 25+ min performing the following:   Activity Description: blaze pods (3-4) positioned on tray table in front of patient with instruction to tap on lit up colored pod with left UE then later right UE.  Activity Setting:  The Blaze Pod Random setting was chosen to enhance cognitive processing and agility, providing an unpredictable environment to simulate real-world scenarios, and fostering quick reactions and adaptability.   Number of Pods:  3 Cycles/Sets:  3 each Arm Duration (Time or Hit Count): 45 sec timed  Patient Stats  Reaction Time:  Left UE 1) 4 hits 2) 6 hits 3) 7 hits Right UE 1) 4 hits 2) 5 hits 3) 6 hits  Activity Description: UE tap on appropriate pod Activity Setting:  The [place] setting was selected to refine precision and concentration, isolating specific muscle groups or movements to enhance overall coordination and targeted muscle engagement.  Number of Pods:  4 Cycles/Sets:  3 each UE Duration (Time or Hit Count):  45 sec  Patient Stats  Reaction Time:  Left UE 1) 5 hits 2) 7 hits 3) 8 hits Right UE  1) 4 hits 2) 5 hits 3) 6 hits   Sitting LAQ x 15 reps each LE- Patient reported fatigued upon completing.     PATIENT EDUCATION: Education details: posture cues with sitting Person educated: Patient Education method: Explanation, Demonstration, Tactile cues, and Verbal cues Education comprehension: verbalized understanding, returned demonstration, verbal cues required, tactile cues required, and needs further education   HOME EXERCISE PROGRAM:  Access Code: [email or web address] URL: [email or web address] Date: 03/23/2022 Prepared by: Sande Brothers  Exercises - Supine Bridge  - 3 x weekly - 3 sets - 10 reps - 2 hold - Supine Gluteal Sets  - 3 x weekly - 3 sets - 10 reps - 5 sec hold - Supine Quad Set  - 1 x daily - 3 x weekly - 3  sets - 10 reps - 5 hold - Seated Long Arc Quad  - 1 x daily - 7 x weekly - 3 sets - 10 reps - Seated Hip Adduction Squeeze with Ball  - 1 x daily - 3 x weekly - 3 sets - 10 reps - 5 hold - Seated Hip Abduction  - 1 x daily - 3 x weekly - 3 sets - 10 reps - 2 hold  PT Short Term Goals -       PT SHORT TERM GOAL #1   Title Pt will be independent with HEP in order to improve strength and balance in order to decrease fall risk and improve function at home and work.    Baseline 01/04/2021= No formal HEP in place; 12/12 no HEP in place; 05/10/2021-Patient and his father were able to report compliance with curent HEP consisting of mostly seated/reclined LE strengthening. Both verbalize no questions at this time.  Time 6    Period Weeks    Status Achieved    Target Date 02/15/21              PT Long Term Goals - Date for completion: 06/01/2022         PT LONG TERM GOAL #1   Title Patient will increase BLE gross strength by 1/2 muscle grade to improve functional strength for improved independence with potential gait, increased standing tolerance and increased ADL ability.    Baseline 01/04/2021- Patient presents with 1/5 to 3-/5 B LE strength with MMT; 12/12: goal partially met for Left knee/hip; 05/10/2021= 2-/5 bilateal Hip flex; 3+/5 bilateral Knee ext; 06/21/2021= Patient presents with 2-/5 bilateral Hip flex; 3+/5 bilateral knee ext/flex; 2-/5 left ankle DF; 0/5 right ankle- and able to increase reps and resistance with LE's. 09/15/2021- Patient technically presents with 2-/5 B hip flex/abd/add - but he is able to raise his hip up to approx 100 deg which has improved. 3+/5 Bilateral knee ext, 2-/5 left ankle and 0/5 right ankle.  12/08/2021= Patient able to lift left knee at 110 deg of hip flex; presents with 3+/5 knee ext, 2-/5 left ankle DF and 0/5 right ankle DF, 2-/5 bilateral Hip abd in seated position.   12/6: R: knee 3+/5 ext, 2/5 flexion, left knee 3+/5 extension, 3+/5 flexion, R hip: 2+/5  hip add, 2+/5 hip ABD L hip: 4-/5 hip ABD, 3+/5 hip ADD, 3+/5 hip flexion    Time 12    Period Weeks    Status Partially met, met with exception of ankle musculature on the R LE    Target Date 03/07/2022     PT LONG TERM GOAL #2   Title Patient will tolerate sitting unsupported demonstrating erect sitting posture for 15 minutes with CGA to demonstrate improved back extensor strength and improved sitting tolerance.    Baseline 01/04/2021- Patient confied to sitting in lift chair or electric power chair with back support and unable to sit upright without physical assistance; 12/12: tolerates <1 minutes upright unsupported sitting. 05/10/2021=static sit with forward trunk lean  in his power wheelchair without back support x approx 3 min. 06/21/2021=Unable to assess today due to patient with acute back pain but on previous visit able to sit x 8 min without back support. 09/15/2021- on last visit- 09/13/2021- patient was able to sit unsupported x 8 min at edge of mat. 10/13/2023 - Patient was able to sit at edge of mat with varying level of assist today from SBA to min A for a total of 20 min. 12/13/2021= Patient demonstrated unsupported sitting at edge of mat for approx 20 min   Time 12    Period Weeks    Status GOAL MET   Target Date 12/08/21      PT LONG TERM GOAL #3   Title Patient will demonstrate ability to perform static standing in // bars > 2 min with Max Assist  without loss of balance and fair posture for improved overall strength for pre-gait and transfer activities.    Baseline 01/04/2021= Patient current uanble to stand- Dependent on hoyer or sit to stand lift for transfers. 05/10/2021=Not appropriate yet- Currently still dependent with all transfers using hoyer. 06/21/2021= Patient continuing now to focus on LE strengthening to prepare for standing-unable to try today due to acute low back pain-  planning on attempting in new cert period. 09/15/2021- Patient has attempted standing 2x in past two week-  max Assist of 2 people - only once  was he successful to clearing his bottom from chair - Will continue to be a focus during the new certification. 12/13/2021= Patient has been limited secondary to increased overall low back pain during this certification and will require more time to focus on this goal.  12/6: not assessed this date, will assess at date when 2-3 PTs are present for assistance    Time 12    Period Weeks    Status On-going          PT LONG TERM GOAL #4   Title Pt will improve FOTO score by 10 points or more demonstrating improved perceived functional ability    Baseline FOTO 7 on 10/17; 03/15/21: FOTO 12; 05/10/2021 06/21/2021= 1; 09/15/2021= 9; 12/13/2021= Will issue next visit 12/6: 4   Time 12    Period Weeks    Status Not Met          PT LONG TERM GOAL #5   Title Patient will perform sit to stand transfer with appropriate AD and max assist of 2 people with 75% consistency to prepare for pregait activities.    Baseline 09/15/2021= Patient unable to stand well- unable to clear his bottom off chair with Max assist of 2 persons. 12/13/2021- Goal not appropriate to try yet but will keep and roll over to next cert as shift continues to focus on transfers/standing   Time 12    Period Weeks    Status New        PT LONG TERM GOAL #6  Title Patient will tolerate sitting unsupported demonstrating erect sitting posture for 30 minutes with CGA to demonstrate improved back extensor strength and improved sitting tolerance.   Baseline 12/13/2021= Patient demonstrated unsupported sitting at edge of mat for approx 20 min;  12/29/2021- Patient performed approx 30 min of dynamic sitting activities today. 12/6:   Time 12   Period Weeks   Status ONGOING         7.  Patient will tolerate 5 minutes or more of standing in sit to stand lift in order to indicate improved lower extremity weightbearing tolerance for progression to standing in parallel bars. Baseline: 1 minute on most recent stand  02/21/22 Goal status: INITIAL       Plan     Clinical Impression Statement Patient presented with good motivation today and performed well with dynamic sitting. He was able to activate core and use BUE to reach for pods well. The patient demonstrated significant progress while utilizing Blaze Pods, showcasing improved coordination, sitting balance, and cognitive function. The incorporation of dual-tasking technology with color recognition and association with specific movements in Blaze Pods was strategically chosen to provide a dynamic training environment, enabling the patient to engage in simultaneous physical and cognitive tasks. This unique approach enhances not only their physical abilities but also fosters increased neural connectivity and mental awareness, contributing to a well-rounded and effective rehabilitation and training experience.   Pt will continue to benefit from skilled physical therapy intervention to address impairments, improve QOL, and attain therapy goals.     Personal Factors and Comorbidities Comorbidity 3+;Time since onset of injury/illness/exacerbation    Comorbidities CVA, diabetes, Seizures    Examination-Activity Limitations Bathing;Bed Mobility;Bend;Caring for Others;Carry;Dressing;Hygiene/Grooming;Lift;Locomotion Level;Reach Overhead;Self Feeding;Sit;Squat;Stairs;Stand;Transfers;Toileting    Examination-Participation Restrictions Cleaning;Community Activity;Driving;Laundry;Medication Management;Meal Prep;Occupation;Personal Finances;Shop;Yard Work;Volunteer    Stability/Clinical Decision Making Evolving/Moderate complexity    Rehab Potential Fair    PT Frequency 2x / week    PT Duration 12 weeks    PT Treatment/Interventions ADLs/Self  Care Home Management;Cryotherapy;Electrical Stimulation;Moist Heat;Ultrasound;DME Instruction;Gait training;Stair training;Functional mobility training;Therapeutic exercise;Balance training;Patient/family education;Orthotic  Fit/Training;Neuromuscular re-education;Wheelchair mobility training;Manual techniques;Passive range of motion;Dry needling;Energy conservation;Taping;Visual/perceptual remediation/compensation;Joint Manipulations    PT Next Visit Plan core strength- sitting at EOM, postural control, sabina sit to stand if able; Continue with progressive LE Strengthening    PT Home Exercise Plan No changes to HEP today    Consulted and Agree with Plan of Care Patient;Family member/caregiver    Family Member Consulted Dad               Lewis Moccasin, PT 03/31/2022, 8:55 AM

## 2022-04-06 ENCOUNTER — Ambulatory Visit: Payer: BC Managed Care – PPO

## 2022-04-06 ENCOUNTER — Ambulatory Visit: Payer: BC Managed Care – PPO | Admitting: Occupational Therapy

## 2022-04-11 ENCOUNTER — Encounter: Payer: BC Managed Care – PPO | Admitting: Occupational Therapy

## 2022-04-11 ENCOUNTER — Ambulatory Visit: Payer: BC Managed Care – PPO | Admitting: Occupational Therapy

## 2022-04-11 ENCOUNTER — Ambulatory Visit: Payer: BC Managed Care – PPO

## 2022-04-13 ENCOUNTER — Ambulatory Visit: Payer: BC Managed Care – PPO | Attending: Physician Assistant | Admitting: Physical Therapy

## 2022-04-13 ENCOUNTER — Ambulatory Visit: Payer: BC Managed Care – PPO | Admitting: Occupational Therapy

## 2022-04-13 DIAGNOSIS — M6281 Muscle weakness (generalized): Secondary | ICD-10-CM | POA: Diagnosis not present

## 2022-04-13 DIAGNOSIS — R269 Unspecified abnormalities of gait and mobility: Secondary | ICD-10-CM | POA: Diagnosis not present

## 2022-04-13 DIAGNOSIS — R278 Other lack of coordination: Secondary | ICD-10-CM | POA: Diagnosis not present

## 2022-04-13 DIAGNOSIS — R262 Difficulty in walking, not elsewhere classified: Secondary | ICD-10-CM | POA: Diagnosis not present

## 2022-04-13 DIAGNOSIS — I6930 Unspecified sequelae of cerebral infarction: Secondary | ICD-10-CM | POA: Diagnosis not present

## 2022-04-13 DIAGNOSIS — R2681 Unsteadiness on feet: Secondary | ICD-10-CM

## 2022-04-13 DIAGNOSIS — R2689 Other abnormalities of gait and mobility: Secondary | ICD-10-CM | POA: Diagnosis not present

## 2022-04-13 NOTE — Therapy (Signed)
OUTPATIENT PHYSICAL THERAPY TREATMENT NOTE/Physical Therapy Progress Note   Dates of reporting period  02/09/22   to   04/13/22    Patient Name: Paul Hall MRN: [id number] DOB:[birth year], 27 y.o., male Today's Date: 04/13/2022  PCP:  Loistine Chance PROVIDER:  Collene Mares, PA-C   PT End of Session - 04/13/22 1550     Visit Number 80    Number of Visits 40    Date for PT Re-Evaluation 06/01/22    Authorization Type [employer] and [employer]- Need auth past 12th visit    Authorization Time Period 01/04/21-03/29/21; Recert 78/29/5621-06/09/6576; Recert 4/69/6295- 05/12/4130; Recert 4/40/1027-25/06/6642    Progress Note Due on Visit 40    PT Start Time 1517    PT Stop Time 1601    PT Time Calculation (min) 44 min    Equipment Utilized During Treatment Gait belt   Hoyer lift   Activity Tolerance Patient tolerated treatment well   queasiness/nausea   Behavior During Therapy WFL for tasks assessed/performed                      Past Medical History:  Diagnosis Date   Diabetes mellitus ([place])    Hypertension    Stroke [facility])    Past Surgical History:  Procedure Laterality Date   IVC FILTER PLACEMENT ([place] [facility])     LEG SURGERY     PEG TUBE PLACEMENT     TRACHEOSTOMY     Patient Active Problem List   Diagnosis Date Noted   Sepsis due to vancomycin resistant Enterococcus species ([place]) 06/06/2019   SIRS (systemic inflammatory response syndrome) ([place]) 06/05/2019   Acute lower UTI 06/05/2019   VRE (vancomycin-resistant Enterococci) infection 06/05/2019   Anemia 06/05/2019   Skin ulcer of sacrum with necrosis of muscle ([place])    Urinary retention    Type 2 diabetes mellitus without complication, with long-term current use of insulin ([facility])    Tachycardia    Lower extremity edema    Acute metabolic encephalopathy    Obstructive sleep apnea    Morbid obesity with BMI of 60.0-69.9, adult ([place])    Goals of care, counseling/discussion    Palliative care encounter     Sepsis ([place]) 04/27/2019   H/O insulin dependent diabetes mellitus 04/27/2019   History of CVA with residual deficit 04/27/2019   Seizure disorder ([place]) 04/27/2019   Decubitus ulcer of sacral region, stage 4 ([place]) 04/27/2019    REFERRING DIAG: Cerebral infarction, unspecified   THERAPY DIAG:  Muscle weakness (generalized)  Difficulty in walking, not elsewhere classified  History of CVA with residual deficit  Unsteadiness on feet  Abnormality of gait and mobility  Other abnormalities of gait and mobility  Rationale for Evaluation and Treatment Rehabilitation  PERTINENT HISTORY: Paul Hall is a 25yoM who presents with severe weakness, quadriparesis, altered sensorium, and visual impairment s/p critical illness and prolonged hospitalization. Pt hospitalized in October 2020 with ARDS 2/2 COVID19 infection. Pt sustained a complex and lengthy hospitalization which included tracheostomy, prolonged sedation, ECMO. In this period pt sustained CVA and SDH. Pt has now been liberated from tracheostomy and G-tube. Pt has since been hospitalized for wound infection and UTI. Pt lives with parents at home, has hospital bed and left chair, hoyer lift transfers, and power WC for mobility needs. Pt needs heavy physical assistance with ADL 2/2 BUE contractures and motor dysfunction   PRECAUTIONS: Fall  SUBJECTIVE: Pt reports coming off of sickness with stomach bug. Pt mom reports he has  been able to sit on EOB at home with assistance of his mother/caregiver. Pt able to bear weight through LE of feet on the ground.    Patient reports PAIN:  Are you having pain? No   TODAY'S TREATMENT:    BP with pt feeling blurred vision: 161/95 and HR 95 following blaze pod tapping   BP in reclined with blurred vision improved 139/94 with HR 89     Therapeutic activities:   Pt Power chair straps removed and back reclined to improve trunk muscle activation for all of the following   Activity  Description: blaze pods (4) positioned on tray table in front of patient with instruction to tap on lit up colored pod with left UE then later right UE.  Activity Setting:  The Blaze Pod Random setting was chosen to enhance cognitive processing and agility, providing an unpredictable environment to simulate real-world scenarios, and fostering quick reactions and adaptability.   Number of Pods:  4 2 rounds: 2 min   Activity Description: Blaze pod taps with target colors Activity Setting:  The [facility] Focus setting was selected to refine precision and concentration, isolating specific muscle groups or movements to enhance overall coordination and targeted muscle engagement.  Number of Pods:  4 Cycles/Sets:  2 Duration (Time or Hit Count):  2 min   Pt responded well to blaze pods intervention but did have some BP increase as noted above, may have been holding his breath so this should be monitored in future session.   Seated LAQ and marching AROM on B LE with AAROM on some reps for end range. X 10 ea LE     PATIENT EDUCATION: Education details: posture cues with sitting Person educated: Patient Education method: Explanation, Demonstration, Tactile cues, and Verbal cues Education comprehension: verbalized understanding, returned demonstration, verbal cues required, tactile cues required, and needs further education   HOME EXERCISE PROGRAM:  Access Code: [email or web address] URL: [email or web address] Date: 03/23/2022 Prepared by: Maureen Ralphs  Exercises - Supine Bridge  - 3 x weekly - 3 sets - 10 reps - 2 hold - Supine Gluteal Sets  - 3 x weekly - 3 sets - 10 reps - 5 sec hold - Supine Quad Set  - 1 x daily - 3 x weekly - 3 sets - 10 reps - 5 hold - Seated Long Arc Quad  - 1 x daily - 7 x weekly - 3 sets - 10 reps - Seated Hip Adduction Squeeze with Ball  - 1 x daily - 3 x weekly - 3 sets - 10 reps - 5 hold - Seated Hip Abduction  - 1 x daily - 3 x weekly - 3 sets - 10  reps - 2 hold    PT Short Term Goals -       PT SHORT TERM GOAL #1   Title Pt will be independent with HEP in order to improve strength and balance in order to decrease fall risk and improve function at home and work.    Baseline 01/04/2021= No formal HEP in place; 12/12 no HEP in place; 05/10/2021-Patient and his father were able to report compliance with curent HEP consisting of mostly seated/reclined LE strengthening. Both verbalize no questions at this time.    Time 6    Period Weeks    Status Achieved    Target Date 02/15/21              PT Long Term Goals - Date for completion: 06/01/2022  PT LONG TERM GOAL #1   Title Patient will increase BLE gross strength by 1/2 muscle grade to improve functional strength for improved independence with potential gait, increased standing tolerance and increased ADL ability.    Baseline 01/04/2021- Patient presents with 1/5 to 3-/5 B LE strength with MMT; 12/12: goal partially met for Left knee/hip; 05/10/2021= 2-/5 bilateal Hip flex; 3+/5 bilateral Knee ext; 06/21/2021= Patient presents with 2-/5 bilateral Hip flex; 3+/5 bilateral knee ext/flex; 2-/5 left ankle DF; 0/5 right ankle- and able to increase reps and resistance with LE's. 09/15/2021- Patient technically presents with 2-/5 B hip flex/abd/add - but he is able to raise his hip up to approx 100 deg which has improved. 3+/5 Bilateral knee ext, 2-/5 left ankle and 0/5 right ankle.  12/08/2021= Patient able to lift left knee at 110 deg of hip flex; presents with 3+/5 knee ext, 2-/5 left ankle DF and 0/5 right ankle DF, 2-/5 bilateral Hip abd in seated position.   12/6: R: knee 3+/5 ext, 2/5 flexion, left knee 3+/5 extension, 3+/5 flexion, R hip: 2+/5 hip add, 2+/5 hip ABD L hip: 4-/5 hip ABD, 3+/5 hip ADD, 3+/5 hip flexion    Time 12    Period Weeks    Status Partially met, met with exception of ankle musculature on the R LE    Target Date 03/07/2022     PT LONG TERM GOAL #2   Title  Patient will tolerate sitting unsupported demonstrating erect sitting posture for 15 minutes with CGA to demonstrate improved back extensor strength and improved sitting tolerance.    Baseline 01/04/2021- Patient confied to sitting in lift chair or electric power chair with back support and unable to sit upright without physical assistance; 12/12: tolerates <1 minutes upright unsupported sitting. 05/10/2021=static sit with forward trunk lean  in his power wheelchair without back support x approx 3 min. 06/21/2021=Unable to assess today due to patient with acute back pain but on previous visit able to sit x 8 min without back support. 09/15/2021- on last visit- 09/13/2021- patient was able to sit unsupported x 8 min at edge of mat. 10/13/2023 - Patient was able to sit at edge of mat with varying level of assist today from SBA to min A for a total of 20 min. 12/13/2021= Patient demonstrated unsupported sitting at edge of mat for approx 20 min   Time 12    Period Weeks    Status GOAL MET   Target Date 12/08/21      PT LONG TERM GOAL #3   Title Patient will demonstrate ability to perform static standing in // bars > 2 min with Max Assist  without loss of balance and fair posture for improved overall strength for pre-gait and transfer activities.    Baseline 01/04/2021= Patient current uanble to stand- Dependent on hoyer or sit to stand lift for transfers. 05/10/2021=Not appropriate yet- Currently still dependent with all transfers using hoyer. 06/21/2021= Patient continuing now to focus on LE strengthening to prepare for standing-unable to try today due to acute low back pain-  planning on attempting in new cert period. 09/15/2021- Patient has attempted standing 2x in past two week- max Assist of 2 people - only once was he successful to clearing his bottom from chair - Will continue to be a focus during the new certification. 12/13/2021= Patient has been limited secondary to increased overall low back pain during this  certification and will require more time to focus on this goal.  12/6: not  assessed this date, will assess at date when 2-3 PTs are present for assistance    Time 12    Period Weeks    Status On-going          PT LONG TERM GOAL #4   Title Pt will improve FOTO score by 10 points or more demonstrating improved perceived functional ability    Baseline FOTO 7 on 10/17; 03/15/21: FOTO 12; 05/10/2021 06/21/2021= 1; 09/15/2021= 9; 12/13/2021= Will issue next visit 12/6: 4   Time 12    Period Weeks    Status Not Met          PT LONG TERM GOAL #5   Title Patient will perform sit to stand transfer with appropriate AD and max assist of 2 people with 75% consistency to prepare for pregait activities.    Baseline 09/15/2021= Patient unable to stand well- unable to clear his bottom off chair with Max assist of 2 persons. 12/13/2021- Goal not appropriate to try yet but will keep and roll over to next cert as shift continues to focus on transfers/standing   Time 12    Period Weeks    Status New        PT LONG TERM GOAL #6  Title Patient will tolerate sitting unsupported demonstrating erect sitting posture for 30 minutes with CGA to demonstrate improved back extensor strength and improved sitting tolerance.   Baseline 12/13/2021= Patient demonstrated unsupported sitting at edge of mat for approx 20 min;  12/29/2021- Patient performed approx 30 min of dynamic sitting activities today.   Time 12   Period Weeks   Status ONGOING         7.  Patient will tolerate 5 minutes or more of standing in sit to stand lift in order to indicate improved lower extremity weightbearing tolerance for progression to standing in parallel bars. Baseline: 1 minute on most recent stand 02/21/22 Goal status: INITIAL       Plan     Clinical Impression Statement Patient presents to physical therapy for progress note this date.  Goal assessment deferred secondary to patient recovering from illness and still experiencing some  weakness as a result.  Goals will be assessed next visit.  Patient continues with core muscle strengthening and activation with blaze pods.  Physical therapist use blaze pod focus setting to allow for more differentiation with his vision to assist with his self driving of his wheelchair as he is able.  Patient's vision is still limiting [place] a different trouble differentiating between yellow and orange colors this date.  With prolonged sitting up and utilizing blaze pods patient did have increase in blood pressure which could be secondary to patient holding breath during this activity.  Blood pressure will continue to be monitored as patient has visual changes in future dates.Pt will continue to benefit from skilled physical therapy intervention to address impairments, improve QOL, and attain therapy goals.     Personal Factors and Comorbidities Comorbidity 3+;Time since onset of injury/illness/exacerbation    Comorbidities CVA, diabetes, Seizures    Examination-Activity Limitations Bathing;Bed Mobility;Bend;Caring for Others;Carry;Dressing;Hygiene/Grooming;Lift;Locomotion Level;Reach Overhead;Self Feeding;Sit;Squat;Stairs;Stand;Transfers;Toileting    Examination-Participation Restrictions Cleaning;Community Activity;Driving;Laundry;Medication Management;Meal Prep;Occupation;Personal Finances;Shop;Yard Work;Volunteer    Stability/Clinical Decision Making Evolving/Moderate complexity    Rehab Potential Fair    PT Frequency 2x / week    PT Duration 12 weeks    PT Treatment/Interventions ADLs/Self Care Home Management;Cryotherapy;Electrical Stimulation;Moist Heat;Ultrasound;DME Instruction;Gait training;Stair training;Functional mobility training;Therapeutic exercise;Balance training;Patient/family education;Orthotic Fit/Training;Neuromuscular re-education;Wheelchair mobility training;Manual techniques;Passive range of motion;Dry needling;Energy  conservation;Taping;Visual/perceptual  remediation/compensation;Joint Manipulations    PT Next Visit Plan core strength- sitting at EOM, postural control, sabina sit to stand if able; Continue with progressive LE Strengthening    PT Home Exercise Plan No changes to HEP today    Consulted and Agree with Plan of Care Patient;Family member/caregiver    Family Member Consulted Mom               Norman Herrlich,  04/13/2022, 5:24 PM

## 2022-04-14 NOTE — Therapy (Signed)
Occupational Therapy Treatment Note  Patient Name: Paul Hall MRN: [id number] DOB:Nov 18, 1995, 27 y.o., male Today's Date: 04/14/2022  PCP: Dr. Gerarda Fraction REFERRING PROVIDER: Dr. Gerarda Fraction   OT End of Session - 04/14/22 0905     Visit Number 12    Number of Visits 18    Date for OT Re-Evaluation 06/13/22    Authorization Type Progress report period starting 10/06/2021    OT Start Time 1650    OT Stop Time 1730    OT Time Calculation (min) 40 min    Activity Tolerance Patient tolerated treatment well    Behavior During Therapy WFL for tasks assessed/performed                     Past Medical History:  Diagnosis Date   Diabetes mellitus ([place])    Hypertension    Stroke [facility])    Past Surgical History:  Procedure Laterality Date   IVC FILTER PLACEMENT ([place] [facility])     LEG SURGERY     PEG TUBE PLACEMENT     TRACHEOSTOMY     Patient Active Problem List   Diagnosis Date Noted   Sepsis due to vancomycin resistant Enterococcus species ([place]) 06/06/2019   SIRS (systemic inflammatory response syndrome) ([place]) 06/05/2019   Acute lower UTI 06/05/2019   VRE (vancomycin-resistant Enterococci) infection 06/05/2019   Anemia 06/05/2019   Skin ulcer of sacrum with necrosis of muscle ([place])    Urinary retention    Type 2 diabetes mellitus without complication, with long-term current use of insulin ([place])    Tachycardia    Lower extremity edema    Acute metabolic encephalopathy    Obstructive sleep apnea    Morbid obesity with BMI of 60.0-69.9, adult ([place])    Goals of care, counseling/discussion    Palliative care encounter    Sepsis ([place]) 04/27/2019   H/O insulin dependent diabetes mellitus 04/27/2019   History of CVA with residual deficit 04/27/2019   Seizure disorder ([place]) 04/27/2019   Decubitus ulcer of sacral region, stage 4 ([place]) 04/27/2019    ONSET DATE: 01/2019  REFERRING DIAG: CVA/COVID-19  THERAPY DIAG:  Muscle weakness (generalized)  Rationale for Evaluation  and Treatment Rehabilitation  SUBJECTIVE:   Patient is looking forward to a wrestling event in March.  SUBJECTIVE STATEMENT:  Pt. reports  feeling better after having had a stomach bug.  Pt accompanied by: self and family member  PERTINENT HISTORY:  Pt. is a 27 y.o. male who was diagnosed with COVID-19, and CVA with resultant quadriplegia. Pt. Was then hospitalized with VRE UTI. PMHx includes: urinary retention, seizure disorder, obstructive sleep disorder, DM Type II, Morbid obesity.   PRECAUTIONS: None  WEIGHT BEARING RESTRICTIONS No  PAIN:  Are you having pain? No  FALLS: Has patient fallen in last 6 months? No  LIVING ENVIRONMENT: Lives with: lives with their family Lives in: House/apartment Stairs: No Level Entry Has following equipment at home: Wheelchair (power) and hoyer lift, sit to stand lift  PLOF: Independent  PATIENT GOALS: To be able to engage in more daily care tasks.  OBJECTIVE:   HAND DOMINANCE: Right  ADLs:  Transfers/ambulation related to ADLs: Eating: Pt. Reports being able to hold standard utensils, and is starting to engage more in self-feeding tasks, hand to mouth patterns. Pt. Reports that he does as much as he can with the task, and family assists  with the remainder of the task. Grooming: Pt. Is able to initiate holding an electric toothbrush, and brush  his teeth. Family assists LB Dressing: Total Assist UE dressing: Pt. is now able to reach up to actively assist with grasping , and pulling his gown down. Toileting:  Total Assist Bathing: MaxA UB, Total assist LB Tub Shower transfers: N/A Equipment: See above    IADLs: Shopping: Relies on family to assist Light housekeeping: Total Assist Meal Prep: Total Assist Community mobility:   Medication management:  Total Assist  Financial management: N/A Handwriting: Not legible: Pt. Is able to hold a pen with the left hand, and initiate marking the page. Pt.'s eye glasses were not  available  MOBILITY STATUS:  Power w/c  POSTURE COMMENTS:  Pt. Requires position changes in his power w/c  ACTIVITY TOLERANCE: Activity tolerance:  Fair  FUNCTIONAL OUTCOME MEASURES: FOTO: 40  TR score: 45  UPPER EXTREMITY ROM     Active ROM Right eval Right 03/21/2022  Left eval Left  03/21/2022  Shoulder flexion 106 scaption 105  Scaption  119 123  Shoulder abduction 114 90  110 115  Shoulder adduction       Shoulder extension       Shoulder internal rotation       Shoulder external rotation       Elbow flexion 120(130) 145  135(135) 145  Elbow extension -45(-35) -22(-35)  -27(-18) -22(145)  Wrist flexion       Wrist extension -30(10) -25(10)  10(50 19(50)  Wrist ulnar deviation       Wrist radial deviation       Wrist pronation       Wrist supination       (Blank rows = not tested)      UPPER EXTREMITY MMT:     Right eval Right 03/21/2022  Left eval Left 03/21/2022  Shoulder flexion 3-/5 scaption 3-/5  3-/5 3/5  Shoulder abduction 3-/5 3-/5  3-/5 3-/5  Shoulder adduction       Shoulder extension       Shoulder internal rotation       Shoulder external rotation       Elbow flexion 3/5 3/5  3/5 3+/5  Elbow extension 2-/5 2/5  2-/5 2/5  Wrist flexion       Wrist extension 2-/5 2-/5  2/5 2+/5  Wrist ulnar deviation       Wrist radial deviation       Wrist pronation       Wrist supination       (Blank rows = not tested)     HAND FUNCTION:  Grip strength: Right: 0 lbs; Left: 0 lbs and Lateral pinch: Right: 5 lbs, Left: 2 lbs  03/21/2022: Lateral pinch: Right: 3.5 lbs, Left: 2 lbs  Bilateral digit PIP/DIP flexion contractures with MP hyperextension with attempts for AROM. Pt. is able to tolerate AROM to the bilateral digits at the initial evaluation however, has a history of pain in the digits.  COORDINATION:  Eval: Pt. is unable to grasp 9-hole test pegs. Pt. is able to initiate grasping larger pegs, and is able to hold a pen in the left  hand.  SENSATION: Light touch: Impaired   EDEMA:  N/A  MUSCLE TONE: BUE flexor Spasticity  COGNITIO Overall cognitive status: Continue to assess in functional context  VISION:   Subjective report: Pt. Was not wearing glasses at the time of the initial eval.  Baseline vision: Vision is very limited. Wears glasses all the time Visual history: History of impaired vision following CVA. Pt. Has received [facility]  low vision rehabilitation program.   VISION ASSESSMENT: Impaired To be further assessed in functional context  PERCEPTION: Impaired   PRAXIS: Impaired: motor planning  OBSERVATIONS:  Pt reports being on Tramadol   TODAY'S TREATMENT:   Neuromuscular re-education:  Pt. worked on bringing bilateral hands to midline with using the right hand to stabilize the the object in midline at the tabletop surface, while using the left hand to unscrew large, smaller lids of of jars. Pt. required cues to lift, and reposition the hand on the lid.   There. Ex:  Pt. worked on BUE AROM with targeted reaching in all planes. Pt. worked on tracking for the objects, and identifying the color of the object prior to reaching for them. Pt. worked on stacking the 6" tall cones at midline, while stabilizing the stack in midline at the tabletop.   PATIENT EDUCATION: Education details: OT services, POC, Goals Person educated: Patient and Parent Education method: Explanation, Demonstration, Tactile cues, and Verbal cues Education comprehension: verbalized understanding, returned demonstration, verbal cues required, and needs further education   HOME EXERCISE PROGRAM:   Continue ongoing assessment, and continue to provide as needed.     GOALS: Goals reviewed with patient? Yes  SHORT TERM GOALS: Target date:05/03/2022     To assess splint fit, and make appropriate adjustments to promote good skin integrity through the palmar surface of the bilateral hands.  Baseline:  03/23/2022: Pt. is wearing splints a couple of hours at night bilateral resting hand splints. 03/21/2022: Pt. is wearing splints a couple of hours at night bilateral resting hand splints. Goal status: Continue ongoing assessment as requested, and as needed   LONG TERM GOALS: Target date: 06/13/2022     FOTO score will Improve by 2 points for Pt. perceived improvement with the assessment specific ADL/IADL tasks.  Baseline: Eval: FOTO score: 40,  TR score: 45 Goal status: Ongoing  2.   Pt. will independently perform oral care for 100% of the task after complete set-up. Baseline:03/23/2022: Pt. Is able to initiate and perform oral care for approximately 75% of the task. Pt. Requires assist at proximally at the elbow for through oral care. 03/21/2022: Pt. Is able to initiate and perform oral care for approximately 75% of the task. Pt. Requires assist at proximally at the elbow for through oral care. Eval: Pt. is able to initiate using an electric toothbrush. Pt. requires assist for set-up, and assist for thoroughness, and as he Pt. fatigues. Goal status: Ongoing  3.  Pt. Will be independent with self-feeding for 100% of the meal after complete set-up Baseline: 03/23/2022: Pt. Is able to perform scooping cereal for 75% of the time. Pt. required assist, and support at the left elbow, and Pt. Presents with limited forearm supination when using the spoon, and bringing it towards his mouth. Pt. Is able to use a fork to spear items, and perform the hand to mouth pattern.  03/21/2022: Pt. Is able to perform scooping cereal for 75% of the time. Pt. required assist, and support at the left elbow, and Pt. Presents with limited forearm supination when using the spoon, and bringing it towards his mouth. Pt. Is able to use a fork to spear items, and perform the hand to mouth pattern.  Eval: Pt. is able to hold standard standard utensils. Pt. Performs as much of the task as he, can and has assistance for the  remainder. Goal status: Ongoing  4.  Pt. Will improve grasp patterns and consistently grasp 1/2" objects for  ADL, and IADL tasks.  Baseline: 03/23/2022: Pt. Is able to grasp 1" objects consistently,and continues to work on the hand patterns needed to grasp 1/2" objects.03/21/2022: Pt. Is able to grasp 1" objects consistently,and continues to work on the hand patterns needed to grasp 1/2" objects. Eval: Pt. is able to grasp 1" objects intermittently using a lateral grasp pattern. Goal status: Ongoing  5.  Pt. will independently write his name legibly with letter sizes under 2". Baseline: 03/23/2022: Pt. Is able to write his name with modA, however has difficulty with formulating letter sizes less than 2" in size with 50% legibility for the 3 letters of his name.03/21/2022: Pt. Is able to write his name with modA, however has difficulty with formulating letter sizes less than 2" in size with 50% legibility for the 3 letters of his name. Eval: Pt is able to hold a thin marker with his left hand, and formulate a line, and initiate a circular pattern (Pt. without glasses today) Goal status: Ongoing  6. Pt. Will reach up to comb/brush his hair  with minA.  Baseline: 03/23/2022: Pt. is now able to more consistently initiate reaching up to his head with his left hand in preparation for haircare12/18/2023: Pt. is now able to more consistently initiate reaching up to his head with his left hand in preparation for haircare. Eval: Pt. is able to initiate reaching up for hair care with a long handled brush, however is unable to sustain UE's in elevation to perform the task.     Goal status: Ongoing  7. Pt. Will independently navigate the w/c through his environment with minA with visual scanning, and hand placement on the controls.  Baseline: Pt. Requires max cues for scanning through the environment, and moderate cues for hand placement on the controls.     Goal status:New       ASSESSMENT:  CLINICAL  IMPRESSION:  Pt. was able to expand his reach further on both the right, and left side today. Pt. was able to identify the color of the cones with 90% accuracy. Pt. was able to scan, and track with cues. Pt. was able to unscrew large, and small lids with cues and assist for hand position. Pt. required multiple rest breaks during each task. Pt. continues to work on improving BUE functioning, w/c navigation, and visual compensatory strategies to improve, and maximize independence with ADLs, and IADLs.    PERFORMANCE DEFICITS in functional skills including ADLs, IADLs, coordination, dexterity, proprioception, ROM, strength, pain, FMC, GMC, decreased knowledge of use of DME, and UE functional use, cognitive skills including safety awareness, and psychosocial skills including coping strategies, environmental adaptation, habits, and routines and behaviors.   IMPAIRMENTS are limiting patient from ADLs, IADLs, leisure, and social participation.   COMORBIDITIES may have co-morbidities  that affects occupational performance. Patient will benefit from skilled OT to address above impairments and improve overall function.  MODIFICATION OR ASSISTANCE TO COMPLETE EVALUATION: Maximum or significant modification of tasks or assist is necessary to complete an evaluation.  OT OCCUPATIONAL PROFILE AND HISTORY: Comprehensive assessment: Review of records and extensive additional review of physical, cognitive, psychosocial history related to current functional performance.  CLINICAL DECISION MAKING: High - multiple treatment options, significant modification of task necessary  REHAB POTENTIAL: Fair    EVALUATION COMPLEXITY: High    PLAN: OT FREQUENCY: 2 x's a week  OT DURATION:12 weeks  PLANNED INTERVENTIONS: self care/ADL training, therapeutic exercise, therapeutic activity, neuromuscular re-education, manual therapy, passive range of motion, patient/family education, and  cognitive  remediation/compensation  RECOMMENDED OTHER SERVICES: PT  CONSULTED AND AGREED WITH PLAN OF CARE: Patient and family member/caregiver  PLAN FOR NEXT SESSION: Initiate treatment   Harrel Carina, MS, OTR/L   Harrel Carina, OT 04/14/2022, 9:07 AM

## 2022-04-18 ENCOUNTER — Ambulatory Visit: Payer: BC Managed Care – PPO | Admitting: Occupational Therapy

## 2022-04-18 ENCOUNTER — Ambulatory Visit: Payer: BC Managed Care – PPO

## 2022-04-18 NOTE — Therapy (Incomplete)
OUTPATIENT PHYSICAL THERAPY TREATMENT NOTE   Patient Name: Paul Hall MRN: [id number] DOB:Jan 31, 1996, 27 y.o., male Today's Date: 04/13/2022  PCP:  Loistine Chance PROVIDER:  Collene Mares, PA-C   PT End of Session - 04/13/22 1550     Visit Number 80    Number of Visits 4    Date for PT Re-Evaluation 06/01/22    Authorization Type [employer] and [employer]- Need auth past 12th visit    Authorization Time Period 01/04/21-03/29/21; Recert [id number]; Recert [id number]; Recert [id number]    Progress Note Due on Visit 72    PT Start Time 1517    PT Stop Time 1601    PT Time Calculation (min) 44 min    Equipment Utilized During Treatment Gait belt   Hoyer lift   Activity Tolerance Patient tolerated treatment well   queasiness/nausea   Behavior During Therapy WFL for tasks assessed/performed                      Past Medical History:  Diagnosis Date   Diabetes mellitus ([place])    Hypertension    Stroke [facility])    Past Surgical History:  Procedure Laterality Date   IVC FILTER PLACEMENT ([place] [facility])     LEG SURGERY     PEG TUBE PLACEMENT     TRACHEOSTOMY     Patient Active Problem List   Diagnosis Date Noted   Sepsis due to vancomycin resistant Enterococcus species ([place]) 06/06/2019   SIRS (systemic inflammatory response syndrome) ([place]) 06/05/2019   Acute lower UTI 06/05/2019   VRE (vancomycin-resistant Enterococci) infection 06/05/2019   Anemia 06/05/2019   Skin ulcer of sacrum with necrosis of muscle ([place])    Urinary retention    Type 2 diabetes mellitus without complication, with long-term current use of insulin ([facility])    Tachycardia    Lower extremity edema    Acute metabolic encephalopathy    Obstructive sleep apnea    Morbid obesity with BMI of 60.0-69.9, adult ([place])    Goals of care, counseling/discussion    Palliative care encounter    Sepsis ([place]) 04/27/2019   H/O insulin dependent diabetes mellitus 04/27/2019    History of CVA with residual deficit 04/27/2019   Seizure disorder ([place]) 04/27/2019   Decubitus ulcer of sacral region, stage 4 ([place]) 04/27/2019    REFERRING DIAG: Cerebral infarction, unspecified   THERAPY DIAG:  Muscle weakness (generalized)  Difficulty in walking, not elsewhere classified  History of CVA with residual deficit  Unsteadiness on feet  Abnormality of gait and mobility  Other abnormalities of gait and mobility  Rationale for Evaluation and Treatment Rehabilitation  PERTINENT HISTORY: Paul Hall is a 25yoM who presents with severe weakness, quadriparesis, altered sensorium, and visual impairment s/p critical illness and prolonged hospitalization. Pt hospitalized in October 2020 with ARDS 2/2 COVID19 infection. Pt sustained a complex and lengthy hospitalization which included tracheostomy, prolonged sedation, ECMO. In this period pt sustained CVA and SDH. Pt has now been liberated from tracheostomy and G-tube. Pt has since been hospitalized for wound infection and UTI. Pt lives with parents at home, has hospital bed and left chair, hoyer lift transfers, and power WC for mobility needs. Pt needs heavy physical assistance with ADL 2/2 BUE contractures and motor dysfunction   PRECAUTIONS: Fall  SUBJECTIVE: ***  Patient reports PAIN:  Are you having pain? No   TODAY'S TREATMENT:    BP with pt feeling blurred vision: 161/95 and HR 95 following blaze pod tapping  BP in reclined with blurred vision improved 139/94 with HR 89     Therapeutic activities:   Pt Power chair straps removed and back reclined to improve trunk muscle activation for all of the following   Activity Description: blaze pods (4) positioned on tray table in front of patient with instruction to tap on lit up colored pod with left UE then later right UE.  Activity Setting:  The Blaze Pod Random setting was chosen to enhance cognitive processing and agility, providing an unpredictable  environment to simulate real-world scenarios, and fostering quick reactions and adaptability.   Number of Pods:  4 2 rounds: 2 min   Activity Description: Blaze pod taps with target colors Activity Setting:  The [place] setting was selected to refine precision and concentration, isolating specific muscle groups or movements to enhance overall coordination and targeted muscle engagement.  Number of Pods:  4 Cycles/Sets:  2 Duration (Time or Hit Count):  2 min   Pt responded well to blaze pods intervention but did have some BP increase as noted above, may have been holding his breath so this should be monitored in future session.   Seated LAQ and marching AROM on B LE with AAROM on some reps for end range. X 10 ea LE     PATIENT EDUCATION: Education details: posture cues with sitting Person educated: Patient Education method: Explanation, Demonstration, Tactile cues, and Verbal cues Education comprehension: verbalized understanding, returned demonstration, verbal cues required, tactile cues required, and needs further education   HOME EXERCISE PROGRAM:  Access Code: [email or web address] URL: [email or web address] Date: 03/23/2022 Prepared by: Sande Brothers  Exercises - Supine Bridge  - 3 x weekly - 3 sets - 10 reps - 2 hold - Supine Gluteal Sets  - 3 x weekly - 3 sets - 10 reps - 5 sec hold - Supine Quad Set  - 1 x daily - 3 x weekly - 3 sets - 10 reps - 5 hold - Seated Long Arc Quad  - 1 x daily - 7 x weekly - 3 sets - 10 reps - Seated Hip Adduction Squeeze with Ball  - 1 x daily - 3 x weekly - 3 sets - 10 reps - 5 hold - Seated Hip Abduction  - 1 x daily - 3 x weekly - 3 sets - 10 reps - 2 hold    PT Short Term Goals -       PT SHORT TERM GOAL #1   Title Pt will be independent with HEP in order to improve strength and balance in order to decrease fall risk and improve function at home and work.    Baseline 01/04/2021= No formal HEP in place; 12/12 no HEP in  place; 05/10/2021-Patient and his father were able to report compliance with curent HEP consisting of mostly seated/reclined LE strengthening. Both verbalize no questions at this time.    Time 6    Period Weeks    Status Achieved    Target Date 02/15/21              PT Long Term Goals - Date for completion: 06/01/2022         PT LONG TERM GOAL #1   Title Patient will increase BLE gross strength by 1/2 muscle grade to improve functional strength for improved independence with potential gait, increased standing tolerance and increased ADL ability.    Baseline 01/04/2021- Patient presents with 1/5 to 3-/5 B LE strength with MMT; 12/12: goal partially met  for Left knee/hip; 05/10/2021= 2-/5 bilateal Hip flex; 3+/5 bilateral Knee ext; 06/21/2021= Patient presents with 2-/5 bilateral Hip flex; 3+/5 bilateral knee ext/flex; 2-/5 left ankle DF; 0/5 right ankle- and able to increase reps and resistance with LE's. 09/15/2021- Patient technically presents with 2-/5 B hip flex/abd/add - but he is able to raise his hip up to approx 100 deg which has improved. 3+/5 Bilateral knee ext, 2-/5 left ankle and 0/5 right ankle.  12/08/2021= Patient able to lift left knee at 110 deg of hip flex; presents with 3+/5 knee ext, 2-/5 left ankle DF and 0/5 right ankle DF, 2-/5 bilateral Hip abd in seated position.   12/6: R: knee 3+/5 ext, 2/5 flexion, left knee 3+/5 extension, 3+/5 flexion, R hip: 2+/5 hip add, 2+/5 hip ABD L hip: 4-/5 hip ABD, 3+/5 hip ADD, 3+/5 hip flexion    Time 12    Period Weeks    Status Partially met, met with exception of ankle musculature on the R LE    Target Date 03/07/2022     PT LONG TERM GOAL #2   Title Patient will tolerate sitting unsupported demonstrating erect sitting posture for 15 minutes with CGA to demonstrate improved back extensor strength and improved sitting tolerance.    Baseline 01/04/2021- Patient confied to sitting in lift chair or electric power chair with back support and  unable to sit upright without physical assistance; 12/12: tolerates <1 minutes upright unsupported sitting. 05/10/2021=static sit with forward trunk lean  in his power wheelchair without back support x approx 3 min. 06/21/2021=Unable to assess today due to patient with acute back pain but on previous visit able to sit x 8 min without back support. 09/15/2021- on last visit- 09/13/2021- patient was able to sit unsupported x 8 min at edge of mat. 10/13/2023 - Patient was able to sit at edge of mat with varying level of assist today from SBA to min A for a total of 20 min. 12/13/2021= Patient demonstrated unsupported sitting at edge of mat for approx 20 min   Time 12    Period Weeks    Status GOAL MET   Target Date 12/08/21      PT LONG TERM GOAL #3   Title Patient will demonstrate ability to perform static standing in // bars > 2 min with Max Assist  without loss of balance and fair posture for improved overall strength for pre-gait and transfer activities.    Baseline 01/04/2021= Patient current uanble to stand- Dependent on hoyer or sit to stand lift for transfers. 05/10/2021=Not appropriate yet- Currently still dependent with all transfers using hoyer. 06/21/2021= Patient continuing now to focus on LE strengthening to prepare for standing-unable to try today due to acute low back pain-  planning on attempting in new cert period. 09/15/2021- Patient has attempted standing 2x in past two week- max Assist of 2 people - only once was he successful to clearing his bottom from chair - Will continue to be a focus during the new certification. 12/13/2021= Patient has been limited secondary to increased overall low back pain during this certification and will require more time to focus on this goal.  12/6: not assessed this date, will assess at date when 2-3 PTs are present for assistance    Time 12    Period Weeks    Status On-going          PT LONG TERM GOAL #4   Title Pt will improve FOTO score by 10 points or more  demonstrating improved perceived functional ability    Baseline FOTO 7 on 10/17; 03/15/21: FOTO 12; 05/10/2021 06/21/2021= 1; 09/15/2021= 9; 12/13/2021= Will issue next visit 12/6: 4   Time 12    Period Weeks    Status Not Met          PT LONG TERM GOAL #5   Title Patient will perform sit to stand transfer with appropriate AD and max assist of 2 people with 75% consistency to prepare for pregait activities.    Baseline 09/15/2021= Patient unable to stand well- unable to clear his bottom off chair with Max assist of 2 persons. 12/13/2021- Goal not appropriate to try yet but will keep and roll over to next cert as shift continues to focus on transfers/standing   Time 12    Period Weeks    Status New        PT LONG TERM GOAL #6  Title Patient will tolerate sitting unsupported demonstrating erect sitting posture for 30 minutes with CGA to demonstrate improved back extensor strength and improved sitting tolerance.   Baseline 12/13/2021= Patient demonstrated unsupported sitting at edge of mat for approx 20 min;  12/29/2021- Patient performed approx 30 min of dynamic sitting activities today.   Time 12   Period Weeks   Status ONGOING         7.  Patient will tolerate 5 minutes or more of standing in sit to stand lift in order to indicate improved lower extremity weightbearing tolerance for progression to standing in parallel bars. Baseline: 1 minute on most recent stand 02/21/22 Goal status: INITIAL       Plan     Clinical Impression Statement Patient presents to physical therapy for progress note this date.  Goal assessment deferred secondary to patient recovering from illness and still experiencing some weakness as a result.  Goals will be assessed next visit.  Patient continues with core muscle strengthening and activation with blaze pods.  Physical therapist use blaze pod focus setting to allow for more differentiation with his vision to assist with his self driving of his wheelchair as he is  able.  Patient's vision is still limiting [place] a different trouble differentiating between yellow and orange colors this date.  With prolonged sitting up and utilizing blaze pods patient did have increase in blood pressure which could be secondary to patient holding breath during this activity.  Blood pressure will continue to be monitored as patient has visual changes in future dates.Pt will continue to benefit from skilled physical therapy intervention to address impairments, improve QOL, and attain therapy goals.     Personal Factors and Comorbidities Comorbidity 3+;Time since onset of injury/illness/exacerbation    Comorbidities CVA, diabetes, Seizures    Examination-Activity Limitations Bathing;Bed Mobility;Bend;Caring for Others;Carry;Dressing;Hygiene/Grooming;Lift;Locomotion Level;Reach Overhead;Self Feeding;Sit;Squat;Stairs;Stand;Transfers;Toileting    Examination-Participation Restrictions Cleaning;Community Activity;Driving;Laundry;Medication Management;Meal Prep;Occupation;Personal Finances;Shop;Yard Work;Volunteer    Stability/Clinical Decision Making Evolving/Moderate complexity    Rehab Potential Fair    PT Frequency 2x / week    PT Duration 12 weeks    PT Treatment/Interventions ADLs/Self Care Home Management;Cryotherapy;Electrical Stimulation;Moist Heat;Ultrasound;DME Instruction;Gait training;Stair training;Functional mobility training;Therapeutic exercise;Balance training;Patient/family education;Orthotic Fit/Training;Neuromuscular re-education;Wheelchair mobility training;Manual techniques;Passive range of motion;Dry needling;Energy conservation;Taping;Visual/perceptual remediation/compensation;Joint Manipulations    PT Next Visit Plan core strength- sitting at EOM, postural control, sabina sit to stand if able; Continue with progressive LE Strengthening    PT Home Exercise Plan No changes to HEP today    Consulted and Agree with Plan of Care Patient;Family member/caregiver    Family  Member Consulted Mom               Norman Herrlich, [place] 04/13/2022, 5:24 PM

## 2022-04-20 ENCOUNTER — Ambulatory Visit: Payer: BC Managed Care – PPO

## 2022-04-20 ENCOUNTER — Encounter: Payer: BC Managed Care – PPO | Admitting: Occupational Therapy

## 2022-04-20 ENCOUNTER — Ambulatory Visit: Payer: BC Managed Care – PPO | Admitting: Occupational Therapy

## 2022-04-20 DIAGNOSIS — M6281 Muscle weakness (generalized): Secondary | ICD-10-CM | POA: Diagnosis not present

## 2022-04-20 DIAGNOSIS — R2689 Other abnormalities of gait and mobility: Secondary | ICD-10-CM | POA: Diagnosis not present

## 2022-04-20 DIAGNOSIS — I6930 Unspecified sequelae of cerebral infarction: Secondary | ICD-10-CM

## 2022-04-20 DIAGNOSIS — R278 Other lack of coordination: Secondary | ICD-10-CM

## 2022-04-20 DIAGNOSIS — R269 Unspecified abnormalities of gait and mobility: Secondary | ICD-10-CM

## 2022-04-20 DIAGNOSIS — R2681 Unsteadiness on feet: Secondary | ICD-10-CM | POA: Diagnosis not present

## 2022-04-20 DIAGNOSIS — R262 Difficulty in walking, not elsewhere classified: Secondary | ICD-10-CM

## 2022-04-20 NOTE — Therapy (Signed)
OUTPATIENT PHYSICAL THERAPY TREATMENT NOTE   Patient Name: Paul Hall MRN: [id number] DOB:June 22, 1995, 27 y.o., male Today's Date: 04/21/2022  PCP:  Cindi Carbon PROVIDER:  Lenise Herald, PA-C   PT End of Session - 04/20/22 1601     Visit Number 81    Number of Visits 99    Date for PT Re-Evaluation 06/01/22    Authorization Type [employer] and [employer]- Need auth past 12th visit    Authorization Time Period 01/04/21-03/29/21; Recert 03/24/2021-06/16/2021; Recert 09/15/2021- 12/08/2021; Recert 12/13/2021-03/07/2022    Progress Note Due on Visit 80    PT Start Time 1600    PT Stop Time 1643    PT Time Calculation (min) 43 min    Equipment Utilized During Treatment Gait belt   Hoyer lift   Activity Tolerance Patient tolerated treatment well   queasiness/nausea   Behavior During Therapy WFL for tasks assessed/performed                       Past Medical History:  Diagnosis Date   Diabetes mellitus (HCC)    Hypertension    Stroke [facility])    Past Surgical History:  Procedure Laterality Date   IVC FILTER PLACEMENT ([facility] HX)     LEG SURGERY     PEG TUBE PLACEMENT     TRACHEOSTOMY     Patient Active Problem List   Diagnosis Date Noted   Sepsis due to vancomycin resistant Enterococcus species (HCC) 06/06/2019   SIRS (systemic inflammatory response syndrome) (HCC) 06/05/2019   Acute lower UTI 06/05/2019   VRE (vancomycin-resistant Enterococci) infection 06/05/2019   Anemia 06/05/2019   Skin ulcer of sacrum with necrosis of muscle (HCC)    Urinary retention    Type 2 diabetes mellitus without complication, with long-term current use of insulin (HCC)    Tachycardia    Lower extremity edema    Acute metabolic encephalopathy    Obstructive sleep apnea    Morbid obesity with BMI of 60.0-69.9, adult (HCC)    Goals of care, counseling/discussion    Palliative care encounter    Sepsis (HCC) 04/27/2019   H/O insulin dependent diabetes mellitus 04/27/2019    History of CVA with residual deficit 04/27/2019   Seizure disorder (HCC) 04/27/2019   Decubitus ulcer of sacral region, stage 4 (HCC) 04/27/2019    REFERRING DIAG: Cerebral infarction, unspecified   THERAPY DIAG:  Muscle weakness (generalized)  Difficulty in walking, not elsewhere classified  History of CVA with residual deficit  Unsteadiness on feet  Abnormality of gait and mobility  Other abnormalities of gait and mobility  Other lack of coordination  Rationale for Evaluation and Treatment Rehabilitation  PERTINENT HISTORY: Paul Hall is a 25yoM who presents with severe weakness, quadriparesis, altered sensorium, and visual impairment s/p critical illness and prolonged hospitalization. Pt hospitalized in October 2020 with ARDS 2/2 COVID19 infection. Pt sustained a complex and lengthy hospitalization which included tracheostomy, prolonged sedation, ECMO. In this period pt sustained CVA and SDH. Pt has now been liberated from tracheostomy and G-tube. Pt has since been hospitalized for wound infection and UTI. Pt lives with parents at home, has hospital bed and left chair, hoyer lift transfers, and power WC for mobility needs. Pt needs heavy physical assistance with ADL 2/2 BUE contractures and motor dysfunction   PRECAUTIONS: Fall  SUBJECTIVE: Patient reports that he is feeling better and no further stomach issues.   Patient reports PAIN:  Are you having pain? No   TODAY'S  TREATMENT:        Therapeutic activities:   Patient was dependently transferred from power w/c to mat table (reclined position) via hoyer -then assisted patient with HHA to upright (back unsupported) postion.   While sitting at EOM (mat in chair position) patient performed several min of static sit focusing on unsupported trunk control- no LOB or complaint of any LBP.  Patient performed forward trunk lean into back extension x 15 reps each with HHA (patient anxious about leaning forward - mild  pulling by PT to bring his trunk more anteriorly)   Patient performed seated trunk twist with AAROM x 15 reps each.   Patient performed cross body reaching with BUE x 15 reps each.   Patient performed chest press/retraction with 2lb bar and min assist to maintain grip with each hand 2 sets x 10 reps.   Patient attempted LAQ (seated unsupported) x 15 reps each   Patient then positioned back into reclined position and dep transfer back to w/c via hoyer- Dep with +2 assist for safety.     PATIENT EDUCATION: Education details: posture cues with sitting Person educated: Patient Education method: Explanation, Demonstration, Tactile cues, and Verbal cues Education comprehension: verbalized understanding, returned demonstration, verbal cues required, tactile cues required, and needs further education   HOME EXERCISE PROGRAM:  Access Code: [email or web address] URL: [email or web address] Date: 03/23/2022 Prepared by: Sande Brothers  Exercises - Supine Bridge  - 3 x weekly - 3 sets - 10 reps - 2 hold - Supine Gluteal Sets  - 3 x weekly - 3 sets - 10 reps - 5 sec hold - Supine Quad Set  - 1 x daily - 3 x weekly - 3 sets - 10 reps - 5 hold - Seated Long Arc Quad  - 1 x daily - 7 x weekly - 3 sets - 10 reps - Seated Hip Adduction Squeeze with Ball  - 1 x daily - 3 x weekly - 3 sets - 10 reps - 5 hold - Seated Hip Abduction  - 1 x daily - 3 x weekly - 3 sets - 10 reps - 2 hold    PT Short Term Goals -       PT SHORT TERM GOAL #1   Title Pt will be independent with HEP in order to improve strength and balance in order to decrease fall risk and improve function at home and work.    Baseline 01/04/2021= No formal HEP in place; 12/12 no HEP in place; 05/10/2021-Patient and his father were able to report compliance with curent HEP consisting of mostly seated/reclined LE strengthening. Both verbalize no questions at this time.    Time 6    Period Weeks    Status Achieved    Target Date  02/15/21              PT Long Term Goals - Date for completion: 06/01/2022         PT LONG TERM GOAL #1   Title Patient will increase BLE gross strength by 1/2 muscle grade to improve functional strength for improved independence with potential gait, increased standing tolerance and increased ADL ability.    Baseline 01/04/2021- Patient presents with 1/5 to 3-/5 B LE strength with MMT; 12/12: goal partially met for Left knee/hip; 05/10/2021= 2-/5 bilateal Hip flex; 3+/5 bilateral Knee ext; 06/21/2021= Patient presents with 2-/5 bilateral Hip flex; 3+/5 bilateral knee ext/flex; 2-/5 left ankle DF; 0/5 right ankle- and able to increase reps and resistance with LE's. 09/15/2021-  Patient technically presents with 2-/5 B hip flex/abd/add - but he is able to raise his hip up to approx 100 deg which has improved. 3+/5 Bilateral knee ext, 2-/5 left ankle and 0/5 right ankle.  12/08/2021= Patient able to lift left knee at 110 deg of hip flex; presents with 3+/5 knee ext, 2-/5 left ankle DF and 0/5 right ankle DF, 2-/5 bilateral Hip abd in seated position.   12/6: R: knee 3+/5 ext, 2/5 flexion, left knee 3+/5 extension, 3+/5 flexion, R hip: 2+/5 hip add, 2+/5 hip ABD L hip: 4-/5 hip ABD, 3+/5 hip ADD, 3+/5 hip flexion    Time 12    Period Weeks    Status Partially met, met with exception of ankle musculature on the R LE    Target Date 03/07/2022     PT LONG TERM GOAL #2   Title Patient will tolerate sitting unsupported demonstrating erect sitting posture for 15 minutes with CGA to demonstrate improved back extensor strength and improved sitting tolerance.    Baseline 01/04/2021- Patient confied to sitting in lift chair or electric power chair with back support and unable to sit upright without physical assistance; 12/12: tolerates <1 minutes upright unsupported sitting. 05/10/2021=static sit with forward trunk lean  in his power wheelchair without back support x approx 3 min. 06/21/2021=Unable to assess today due  to patient with acute back pain but on previous visit able to sit x 8 min without back support. 09/15/2021- on last visit- 09/13/2021- patient was able to sit unsupported x 8 min at edge of mat. 10/13/2023 - Patient was able to sit at edge of mat with varying level of assist today from SBA to min A for a total of 20 min. 12/13/2021= Patient demonstrated unsupported sitting at edge of mat for approx 20 min   Time 12    Period Weeks    Status GOAL MET   Target Date 12/08/21      PT LONG TERM GOAL #3   Title Patient will demonstrate ability to perform static standing in // bars > 2 min with Max Assist  without loss of balance and fair posture for improved overall strength for pre-gait and transfer activities.    Baseline 01/04/2021= Patient current uanble to stand- Dependent on hoyer or sit to stand lift for transfers. 05/10/2021=Not appropriate yet- Currently still dependent with all transfers using hoyer. 06/21/2021= Patient continuing now to focus on LE strengthening to prepare for standing-unable to try today due to acute low back pain-  planning on attempting in new cert period. 09/15/2021- Patient has attempted standing 2x in past two week- max Assist of 2 people - only once was he successful to clearing his bottom from chair - Will continue to be a focus during the new certification. 12/13/2021= Patient has been limited secondary to increased overall low back pain during this certification and will require more time to focus on this goal.  12/6: not assessed this date, will assess at date when 2-3 PTs are present for assistance    Time 12    Period Weeks    Status On-going          PT LONG TERM GOAL #4   Title Pt will improve FOTO score by 10 points or more demonstrating improved perceived functional ability    Baseline FOTO 7 on 10/17; 03/15/21: FOTO 12; 05/10/2021 06/21/2021= 1; 09/15/2021= 9; 12/13/2021= Will issue next visit 12/6: 4   Time 12    Period Weeks    Status  Not Met          PT LONG TERM  GOAL #5   Title Patient will perform sit to stand transfer with appropriate AD and max assist of 2 people with 75% consistency to prepare for pregait activities.    Baseline 09/15/2021= Patient unable to stand well- unable to clear his bottom off chair with Max assist of 2 persons. 12/13/2021- Goal not appropriate to try yet but will keep and roll over to next cert as shift continues to focus on transfers/standing   Time 12    Period Weeks    Status New        PT LONG TERM GOAL #6  Title Patient will tolerate sitting unsupported demonstrating erect sitting posture for 30 minutes with CGA to demonstrate improved back extensor strength and improved sitting tolerance.   Baseline 12/13/2021= Patient demonstrated unsupported sitting at edge of mat for approx 20 min;  12/29/2021- Patient performed approx 30 min of dynamic sitting activities today.   Time 12   Period Weeks   Status ONGOING         7.  Patient will tolerate 5 minutes or more of standing in sit to stand lift in order to indicate improved lower extremity weightbearing tolerance for progression to standing in parallel bars. Baseline: 1 minute on most recent stand 02/21/22 Goal status: INITIAL       Plan     Clinical Impression Statement Patient presents with some weakness after missing PT visits yet agreeable to try to sit at [facility] and perform some UE/LE activities. He performed well with good seated trunk control and no report of pain or LOB today. Pt will continue to benefit from skilled physical therapy intervention to address impairments, improve QOL, and attain therapy goals.     Personal Factors and Comorbidities Comorbidity 3+;Time since onset of injury/illness/exacerbation    Comorbidities CVA, diabetes, Seizures    Examination-Activity Limitations Bathing;Bed Mobility;Bend;Caring for Others;Carry;Dressing;Hygiene/Grooming;Lift;Locomotion Level;Reach Overhead;Self Feeding;Sit;Squat;Stairs;Stand;Transfers;Toileting     Examination-Participation Restrictions Cleaning;Community Activity;Driving;Laundry;Medication Management;Meal Prep;Occupation;Personal Finances;Shop;Yard Work;Volunteer    Stability/Clinical Decision Making Evolving/Moderate complexity    Rehab Potential Fair    PT Frequency 2x / week    PT Duration 12 weeks    PT Treatment/Interventions ADLs/Self Care Home Management;Cryotherapy;Electrical Stimulation;Moist Heat;Ultrasound;DME Instruction;Gait training;Stair training;Functional mobility training;Therapeutic exercise;Balance training;Patient/family education;Orthotic Fit/Training;Neuromuscular re-education;Wheelchair mobility training;Manual techniques;Passive range of motion;Dry needling;Energy conservation;Taping;Visual/perceptual remediation/compensation;Joint Manipulations    PT Next Visit Plan core strength- sitting at EOM, postural control, sabina sit to stand if able; Continue with progressive LE Strengthening    PT Home Exercise Plan No changes to HEP today    Consulted and Agree with Plan of Care Patient;Family member/caregiver    Family Member Consulted Mom               Lenda Kelp, PT 04/21/2022, 11:15 AM

## 2022-04-20 NOTE — Therapy (Signed)
Occupational Therapy Treatment Note  Patient Name: Paul Hall MRN: [id number] DOB:15-Jul-1995, 27 y.o., male Today's Date: 04/20/2022  PCP: Dr. Sherwood Gambler REFERRING PROVIDER: Dr. Sherwood Gambler   OT End of Session - 04/20/22 1751     Visit Number 13    Number of Visits 36    Date for OT Re-Evaluation 06/13/22    Authorization Type Progress report period starting 10/06/2021    OT Start Time 1645    OT Stop Time 1727    OT Time Calculation (min) 42 min    Activity Tolerance Patient tolerated treatment well    Behavior During Therapy WFL for tasks assessed/performed                     Past Medical History:  Diagnosis Date   Diabetes mellitus (HCC)    Hypertension    Stroke [facility])    Past Surgical History:  Procedure Laterality Date   IVC FILTER PLACEMENT ([facility] HX)     LEG SURGERY     PEG TUBE PLACEMENT     TRACHEOSTOMY     Patient Active Problem List   Diagnosis Date Noted   Sepsis due to vancomycin resistant Enterococcus species (HCC) 06/06/2019   SIRS (systemic inflammatory response syndrome) (HCC) 06/05/2019   Acute lower UTI 06/05/2019   VRE (vancomycin-resistant Enterococci) infection 06/05/2019   Anemia 06/05/2019   Skin ulcer of sacrum with necrosis of muscle (HCC)    Urinary retention    Type 2 diabetes mellitus without complication, with long-term current use of insulin (HCC)    Tachycardia    Lower extremity edema    Acute metabolic encephalopathy    Obstructive sleep apnea    Morbid obesity with BMI of 60.0-69.9, adult (HCC)    Goals of care, counseling/discussion    Palliative care encounter    Sepsis (HCC) 04/27/2019   H/O insulin dependent diabetes mellitus 04/27/2019   History of CVA with residual deficit 04/27/2019   Seizure disorder (HCC) 04/27/2019   Decubitus ulcer of sacral region, stage 4 (HCC) 04/27/2019    ONSET DATE: 01/2019  REFERRING DIAG: CVA/COVID-19  THERAPY DIAG:  Muscle weakness (generalized)  Rationale for Evaluation  and Treatment Rehabilitation  SUBJECTIVE:   Patient is looking forward to a wrestling event in March.  SUBJECTIVE STATEMENT:  Pt. Reports feeling good today.  Pt accompanied by: self and family member  PERTINENT HISTORY:  Pt. is a 27 y.o. male who was diagnosed with COVID-19, and CVA with resultant quadriplegia. Pt. Was then hospitalized with VRE UTI. PMHx includes: urinary retention, seizure disorder, obstructive sleep disorder, DM Type II, Morbid obesity.   PRECAUTIONS: None  WEIGHT BEARING RESTRICTIONS No  PAIN:  Are you having pain? No  FALLS: Has patient fallen in last 6 months? No  LIVING ENVIRONMENT: Lives with: lives with their family Lives in: House/apartment Stairs: No Level Entry Has following equipment at home: Wheelchair (power) and hoyer lift, sit to stand lift  PLOF: Independent  PATIENT GOALS: To be able to engage in more daily care tasks.  OBJECTIVE:   HAND DOMINANCE: Right  ADLs:  Transfers/ambulation related to ADLs: Eating: Pt. Reports being able to hold standard utensils, and is starting to engage more in self-feeding tasks, hand to mouth patterns. Pt. Reports that he does as much as he can with the task, and family assists  with the remainder of the task. Grooming: Pt. Is able to initiate holding an electric toothbrush, and brush his teeth. Family assists LB Dressing:  Total Assist UE dressing: Pt. is now able to reach up to actively assist with grasping , and pulling his gown down. Toileting:  Total Assist Bathing: MaxA UB, Total assist LB Tub Shower transfers: N/A Equipment: See above    IADLs: Shopping: Relies on family to assist Light housekeeping: Total Assist Meal Prep: Total Assist Community mobility:   Medication management:  Total Assist  Financial management: N/A Handwriting: Not legible: Pt. Is able to hold a pen with the left hand, and initiate marking the page. Pt.'s eye glasses were not available  MOBILITY STATUS:  Power  w/c  POSTURE COMMENTS:  Pt. Requires position changes in his power w/c  ACTIVITY TOLERANCE: Activity tolerance:  Fair  FUNCTIONAL OUTCOME MEASURES: FOTO: 40  TR score: 45  UPPER EXTREMITY ROM     Active ROM Right eval Right 03/21/2022  Left eval Left  03/21/2022  Shoulder flexion 106 scaption 105  Scaption  119 123  Shoulder abduction 114 90  110 115  Shoulder adduction       Shoulder extension       Shoulder internal rotation       Shoulder external rotation       Elbow flexion 120(130) 145  135(135) 145  Elbow extension -45(-35) -22(-35)  -27(-18) -22(145)  Wrist flexion       Wrist extension -30(10) -25(10)  10(50 19(50)  Wrist ulnar deviation       Wrist radial deviation       Wrist pronation       Wrist supination       (Blank rows = not tested)      UPPER EXTREMITY MMT:     Right eval Right 03/21/2022  Left eval Left 03/21/2022  Shoulder flexion 3-/5 scaption 3-/5  3-/5 3/5  Shoulder abduction 3-/5 3-/5  3-/5 3-/5  Shoulder adduction       Shoulder extension       Shoulder internal rotation       Shoulder external rotation       Elbow flexion 3/5 3/5  3/5 3+/5  Elbow extension 2-/5 2/5  2-/5 2/5  Wrist flexion       Wrist extension 2-/5 2-/5  2/5 2+/5  Wrist ulnar deviation       Wrist radial deviation       Wrist pronation       Wrist supination       (Blank rows = not tested)     HAND FUNCTION:  Grip strength: Right: 0 lbs; Left: 0 lbs and Lateral pinch: Right: 5 lbs, Left: 2 lbs  03/21/2022: Lateral pinch: Right: 3.5 lbs, Left: 2 lbs  Bilateral digit PIP/DIP flexion contractures with MP hyperextension with attempts for AROM. Pt. is able to tolerate AROM to the bilateral digits at the initial evaluation however, has a history of pain in the digits.  COORDINATION:  Eval: Pt. is unable to grasp 9-hole test pegs. Pt. is able to initiate grasping larger pegs, and is able to hold a pen in the left hand.  SENSATION: Light touch: Impaired    EDEMA:  N/A  MUSCLE TONE: BUE flexor Spasticity  COGNITIO Overall cognitive status: Continue to assess in functional context  VISION:   Subjective report: Pt. Was not wearing glasses at the time of the initial eval.  Baseline vision: Vision is very limited. Wears glasses all the time Visual history: History of impaired vision following CVA. Pt. Has received treatment through the [facility] low vision rehabilitation program.  VISION ASSESSMENT: Impaired To be further assessed in functional context  PERCEPTION: Impaired   PRAXIS: Impaired: motor planning  OBSERVATIONS:  Pt reports being on Tramadol   TODAY'S TREATMENT:   There. Ex:  Pt. worked on BUE AROM with targeted reaching in all planes. Pt. worked on tracking for the objects, and identifying the color of the object prior to reaching for them. Pt. worked on stacking the 6" tall cones at midline, while stabilizing the stack in midline at the tabletop. Pt. worked with a 2# cuff weight in place on the left wrist, and a 1# cuff weight on the right wrist. Pt. worked on functional reaching for [employer], and moving them through 3 horizontal rungs on the left with support proximally at the left elbow.  Pt. worked on sliding the [employer] along the bottom rung with support at the right elbow.    PATIENT EDUCATION: Education details: OT services, POC, Goals Person educated: Patient and Parent Education method: Explanation, Demonstration, Tactile cues, and Verbal cues Education comprehension: verbalized understanding, returned demonstration, verbal cues required, and needs further education   HOME EXERCISE PROGRAM:   Continue ongoing assessment, and continue to provide as needed.     GOALS: Goals reviewed with patient? Yes  SHORT TERM GOALS: Target date:05/03/2022     To assess splint fit, and make appropriate adjustments to promote good skin integrity through the palmar surface of the bilateral hands.  Baseline:  03/23/2022: Pt. is wearing splints a couple of hours at night bilateral resting hand splints. 03/21/2022: Pt. is wearing splints a couple of hours at night bilateral resting hand splints. Goal status: Continue ongoing assessment as requested, and as needed   LONG TERM GOALS: Target date: 06/13/2022     FOTO score will Improve by 2 points for Pt. perceived improvement with the assessment specific ADL/IADL tasks.  Baseline: Eval: FOTO score: 40,  TR score: 45 Goal status: Ongoing  2.   Pt. will independently perform oral care for 100% of the task after complete set-up. Baseline:03/23/2022: Pt. Is able to initiate and perform oral care for approximately 75% of the task. Pt. Requires assist at proximally at the elbow for through oral care. 03/21/2022: Pt. Is able to initiate and perform oral care for approximately 75% of the task. Pt. Requires assist at proximally at the elbow for through oral care. Eval: Pt. is able to initiate using an electric toothbrush. Pt. requires assist for set-up, and assist for thoroughness, and as he Pt. fatigues. Goal status: Ongoing  3.  Pt. Will be independent with self-feeding for 100% of the meal after complete set-up Baseline: 03/23/2022: Pt. Is able to perform scooping cereal for 75% of the time. Pt. required assist, and support at the left elbow, and Pt. Presents with limited forearm supination when using the spoon, and bringing it towards his mouth. Pt. Is able to use a fork to spear items, and perform the hand to mouth pattern.  03/21/2022: Pt. Is able to perform scooping cereal for 75% of the time. Pt. required assist, and support at the left elbow, and Pt. Presents with limited forearm supination when using the spoon, and bringing it towards his mouth. Pt. Is able to use a fork to spear items, and perform the hand to mouth pattern.  Eval: Pt. is able to hold standard standard utensils. Pt. Performs as much of the task as he, can and has assistance for the  remainder. Goal status: Ongoing  4.  Pt. Will improve grasp patterns  and consistently grasp 1/2" objects for ADL, and IADL tasks.  Baseline: 03/23/2022: Pt. Is able to grasp 1" objects consistently,and continues to work on the hand patterns needed to grasp 1/2" objects.03/21/2022: Pt. Is able to grasp 1" objects consistently,and continues to work on the hand patterns needed to grasp 1/2" objects. Eval: Pt. is able to grasp 1" objects intermittently using a lateral grasp pattern. Goal status: Ongoing  5.  Pt. will independently write his name legibly with letter sizes under 2". Baseline: 03/23/2022: Pt. Is able to write his name with modA, however has difficulty with formulating letter sizes less than 2" in size with 50% legibility for the 3 letters of his name.03/21/2022: Pt. Is able to write his name with modA, however has difficulty with formulating letter sizes less than 2" in size with 50% legibility for the 3 letters of his name. Eval: Pt is able to hold a thin marker with his left hand, and formulate a line, and initiate a circular pattern (Pt. without glasses today) Goal status: Ongoing  6. Pt. Will reach up to comb/brush his hair  with minA.  Baseline: 03/23/2022: Pt. is now able to more consistently initiate reaching up to his head with his left hand in preparation for haircare12/18/2023: Pt. is now able to more consistently initiate reaching up to his head with his left hand in preparation for haircare. Eval: Pt. is able to initiate reaching up for hair care with a long handled brush, however is unable to sustain UE's in elevation to perform the task.     Goal status: Ongoing  7. Pt. Will independently navigate the w/c through his environment with minA with visual scanning, and hand placement on the controls.  Baseline: Pt. Requires max cues for scanning through the environment, and moderate cues for hand placement on the controls.     Goal status:New       ASSESSMENT:  CLINICAL  IMPRESSION:  Pt. continues to be able to expand his reach further on both the right, and left side today, and tolerated the 2# weight on the left wrist, and the 1# on the right wrist. Pt. required visual cues for scanning to locate the items in anticipation for reaching. Pt. required occasional support proximally at the elbow as Pt. fatigues when reaching. Pt. required multiple rest breaks during each task. Pt. continues to work on improving BUE functioning, w/c navigation, and visual compensatory strategies to improve, and maximize independence with ADLs, and IADLs.    PERFORMANCE DEFICITS in functional skills including ADLs, IADLs, coordination, dexterity, proprioception, ROM, strength, pain, Lewis, GMC, decreased knowledge of use of DME, and UE functional use, cognitive skills including safety awareness, and psychosocial skills including coping strategies, environmental adaptation, habits, and routines and behaviors.   IMPAIRMENTS are limiting patient from ADLs, IADLs, leisure, and social participation.   COMORBIDITIES may have co-morbidities  that affects occupational performance. Patient will benefit from skilled OT to address above impairments and improve overall function.  MODIFICATION OR ASSISTANCE TO COMPLETE EVALUATION: Maximum or significant modification of tasks or assist is necessary to complete an evaluation.  OT OCCUPATIONAL PROFILE AND HISTORY: Comprehensive assessment: Review of records and extensive additional review of physical, cognitive, psychosocial history related to current functional performance.  CLINICAL DECISION MAKING: High - multiple treatment options, significant modification of task necessary  REHAB POTENTIAL: Fair    EVALUATION COMPLEXITY: High    PLAN: OT FREQUENCY: 2 x's a week  OT DURATION:12 weeks  PLANNED INTERVENTIONS: self care/ADL training, therapeutic exercise,  therapeutic activity, neuromuscular re-education, manual therapy, passive range of  motion, patient/family education, and cognitive remediation/compensation  RECOMMENDED OTHER SERVICES: PT  CONSULTED AND AGREED WITH PLAN OF CARE: Patient and family member/caregiver  PLAN FOR NEXT SESSION: Initiate treatment   Olegario Messier, MS, OTR/L   Olegario Messier, OT 04/20/2022, 6:10 PM

## 2022-04-25 ENCOUNTER — Ambulatory Visit: Payer: BC Managed Care – PPO | Admitting: Occupational Therapy

## 2022-04-25 ENCOUNTER — Ambulatory Visit: Payer: BC Managed Care – PPO

## 2022-04-25 DIAGNOSIS — M6281 Muscle weakness (generalized): Secondary | ICD-10-CM

## 2022-04-25 DIAGNOSIS — I6930 Unspecified sequelae of cerebral infarction: Secondary | ICD-10-CM | POA: Diagnosis not present

## 2022-04-25 DIAGNOSIS — R269 Unspecified abnormalities of gait and mobility: Secondary | ICD-10-CM | POA: Diagnosis not present

## 2022-04-25 DIAGNOSIS — I6389 Other cerebral infarction: Secondary | ICD-10-CM | POA: Diagnosis not present

## 2022-04-25 DIAGNOSIS — R2681 Unsteadiness on feet: Secondary | ICD-10-CM

## 2022-04-25 DIAGNOSIS — R262 Difficulty in walking, not elsewhere classified: Secondary | ICD-10-CM

## 2022-04-25 DIAGNOSIS — R32 Unspecified urinary incontinence: Secondary | ICD-10-CM | POA: Diagnosis not present

## 2022-04-25 DIAGNOSIS — R278 Other lack of coordination: Secondary | ICD-10-CM | POA: Diagnosis not present

## 2022-04-25 DIAGNOSIS — R2689 Other abnormalities of gait and mobility: Secondary | ICD-10-CM | POA: Diagnosis not present

## 2022-04-25 DIAGNOSIS — L8994 Pressure ulcer of unspecified site, stage 4: Secondary | ICD-10-CM | POA: Diagnosis not present

## 2022-04-25 NOTE — Therapy (Signed)
OUTPATIENT PHYSICAL THERAPY TREATMENT NOTE   Patient Name: Paul Hall MRN: [id number] DOB:Mar 09, 1996, 27 y.o., male Today's Date: 04/25/2022  PCP:  Loistine Chance PROVIDER:  Collene Mares, PA-C   PT End of Session - 04/25/22 1614     Visit Number 82    Number of Visits 48    Date for PT Re-Evaluation 06/01/22    Authorization Type [employer] and [employer]- Need auth past 12th visit    Authorization Time Period 01/04/21-03/29/21; Recert 52/84/1324-07/04/270; Recert 5/36/6440- 06/06/7423; Recert 9/56/3875-64/06/3293    Progress Note Due on Visit 71    PT Start Time 1616    PT Stop Time 1645    PT Time Calculation (min) 29 min    Equipment Utilized During Treatment Gait belt   Hoyer lift   Activity Tolerance Patient tolerated treatment well   queasiness/nausea   Behavior During Therapy WFL for tasks assessed/performed                       Past Medical History:  Diagnosis Date   Diabetes mellitus ([place])    Hypertension    Stroke ([place])    Past Surgical History:  Procedure Laterality Date   IVC FILTER PLACEMENT ([facility])     LEG SURGERY     PEG TUBE PLACEMENT     TRACHEOSTOMY     Patient Active Problem List   Diagnosis Date Noted   Sepsis due to vancomycin resistant Enterococcus species ([place]) 06/06/2019   SIRS (systemic inflammatory response syndrome) ([place]) 06/05/2019   Acute lower UTI 06/05/2019   VRE (vancomycin-resistant Enterococci) infection 06/05/2019   Anemia 06/05/2019   Skin ulcer of sacrum with necrosis of muscle ([place])    Urinary retention    Type 2 diabetes mellitus without complication, with long-term current use of insulin ([facility])    Tachycardia    Lower extremity edema    Acute metabolic encephalopathy    Obstructive sleep apnea    Morbid obesity with BMI of 60.0-69.9, adult ([place])    Goals of care, counseling/discussion    Palliative care encounter    Sepsis ([place]) 04/27/2019   H/O insulin dependent diabetes mellitus 04/27/2019    History of CVA with residual deficit 04/27/2019   Seizure disorder ([place]) 04/27/2019   Decubitus ulcer of sacral region, stage 4 ([place]) 04/27/2019    REFERRING DIAG: Cerebral infarction, unspecified   THERAPY DIAG:  Muscle weakness (generalized)  Difficulty in walking, not elsewhere classified  History of CVA with residual deficit  Unsteadiness on feet  Abnormality of gait and mobility  Rationale for Evaluation and Treatment Rehabilitation  PERTINENT HISTORY: Paul Hall is a 25yoM who presents with severe weakness, quadriparesis, altered sensorium, and visual impairment s/p critical illness and prolonged hospitalization. Pt hospitalized in October 2020 with ARDS 2/2 COVID19 infection. Pt sustained a complex and lengthy hospitalization which included tracheostomy, prolonged sedation, ECMO. In this period pt sustained CVA and SDH. Pt has now been liberated from tracheostomy and G-tube. Pt has since been hospitalized for wound infection and UTI. Pt lives with parents at home, has hospital bed and left chair, hoyer lift transfers, and power WC for mobility needs. Pt needs heavy physical assistance with ADL 2/2 BUE contractures and motor dysfunction   PRECAUTIONS: Fall  SUBJECTIVE: Pt arriving to PT with his mother. No new concerns or updates from last session.  Patient reports PAIN:  Are you having pain? No   TODAY'S TREATMENT:   There.ex:    Utilization of hoyer  lift to transfer pt to<> from power w/c to<> from mat table placed in chair position at beginning then ending of session. +2 assist for safe transfer.    ~5 minutes unsupported sitting for core engagement, close supervision. Pt performing without UE support   LAQ's: x15/LE, good ability to stay upright at trunk and not demonstrate posterior bias   Trunk extension to anterior lean/trunk flexion performing modified crunch for rectus abdominus strengthening. Light HHA+2 for stability but pt not needing physical assist.    R/L trunk rotations for oblique activation: x15/side close suprevision   Chest press with PVC pipe needing assist for grip during motion. Chest press to light manual resistance into scap retraction.  X15/direction        PATIENT EDUCATION: Education details: posture cues with sitting Person educated: Patient Education method: Explanation, Demonstration, Tactile cues, and Verbal cues Education comprehension: verbalized understanding, returned demonstration, verbal cues required, tactile cues required, and needs further education   HOME EXERCISE PROGRAM:  Access Code: [email or web address] URL: [email or web address] Date: 03/23/2022 Prepared by: Maureen Ralphs  Exercises - Supine Bridge  - 3 x weekly - 3 sets - 10 reps - 2 hold - Supine Gluteal Sets  - 3 x weekly - 3 sets - 10 reps - 5 sec hold - Supine Quad Set  - 1 x daily - 3 x weekly - 3 sets - 10 reps - 5 hold - Seated Long Arc Quad  - 1 x daily - 7 x weekly - 3 sets - 10 reps - Seated Hip Adduction Squeeze with Ball  - 1 x daily - 3 x weekly - 3 sets - 10 reps - 5 hold - Seated Hip Abduction  - 1 x daily - 3 x weekly - 3 sets - 10 reps - 2 hold    PT Short Term Goals -       PT SHORT TERM GOAL #1   Title Pt will be independent with HEP in order to improve strength and balance in order to decrease fall risk and improve function at home and work.    Baseline 01/04/2021= No formal HEP in place; 12/12 no HEP in place; 05/10/2021-Patient and his father were able to report compliance with curent HEP consisting of mostly seated/reclined LE strengthening. Both verbalize no questions at this time.    Time 6    Period Weeks    Status Achieved    Target Date 02/15/21              PT Long Term Goals - Date for completion: 06/01/2022         PT LONG TERM GOAL #1   Title Patient will increase BLE gross strength by 1/2 muscle grade to improve functional strength for improved independence with potential gait, increased  standing tolerance and increased ADL ability.    Baseline 01/04/2021- Patient presents with 1/5 to 3-/5 B LE strength with MMT; 12/12: goal partially met for Left knee/hip; 05/10/2021= 2-/5 bilateal Hip flex; 3+/5 bilateral Knee ext; 06/21/2021= Patient presents with 2-/5 bilateral Hip flex; 3+/5 bilateral knee ext/flex; 2-/5 left ankle DF; 0/5 right ankle- and able to increase reps and resistance with LE's. 09/15/2021- Patient technically presents with 2-/5 B hip flex/abd/add - but he is able to raise his hip up to approx 100 deg which has improved. 3+/5 Bilateral knee ext, 2-/5 left ankle and 0/5 right ankle.  12/08/2021= Patient able to lift left knee at 110 deg of hip flex; presents with 3+/5 knee ext, 2-/5  left ankle DF and 0/5 right ankle DF, 2-/5 bilateral Hip abd in seated position.   12/6: R: knee 3+/5 ext, 2/5 flexion, left knee 3+/5 extension, 3+/5 flexion, R hip: 2+/5 hip add, 2+/5 hip ABD L hip: 4-/5 hip ABD, 3+/5 hip ADD, 3+/5 hip flexion    Time 12    Period Weeks    Status Partially met, met with exception of ankle musculature on the R LE    Target Date 03/07/2022     PT LONG TERM GOAL #2   Title Patient will tolerate sitting unsupported demonstrating erect sitting posture for 15 minutes with CGA to demonstrate improved back extensor strength and improved sitting tolerance.    Baseline 01/04/2021- Patient confied to sitting in lift chair or electric power chair with back support and unable to sit upright without physical assistance; 12/12: tolerates <1 minutes upright unsupported sitting. 05/10/2021=static sit with forward trunk lean  in his power wheelchair without back support x approx 3 min. 06/21/2021=Unable to assess today due to patient with acute back pain but on previous visit able to sit x 8 min without back support. 09/15/2021- on last visit- 09/13/2021- patient was able to sit unsupported x 8 min at edge of mat. 10/13/2023 - Patient was able to sit at edge of mat with varying level of assist  today from SBA to min A for a total of 20 min. 12/13/2021= Patient demonstrated unsupported sitting at edge of mat for approx 20 min   Time 12    Period Weeks    Status GOAL MET   Target Date 12/08/21      PT LONG TERM GOAL #3   Title Patient will demonstrate ability to perform static standing in // bars > 2 min with Max Assist  without loss of balance and fair posture for improved overall strength for pre-gait and transfer activities.    Baseline 01/04/2021= Patient current uanble to stand- Dependent on hoyer or sit to stand lift for transfers. 05/10/2021=Not appropriate yet- Currently still dependent with all transfers using hoyer. 06/21/2021= Patient continuing now to focus on LE strengthening to prepare for standing-unable to try today due to acute low back pain-  planning on attempting in new cert period. 09/15/2021- Patient has attempted standing 2x in past two week- max Assist of 2 people - only once was he successful to clearing his bottom from chair - Will continue to be a focus during the new certification. 12/13/2021= Patient has been limited secondary to increased overall low back pain during this certification and will require more time to focus on this goal.  12/6: not assessed this date, will assess at date when 2-3 PTs are present for assistance    Time 12    Period Weeks    Status On-going          PT LONG TERM GOAL #4   Title Pt will improve FOTO score by 10 points or more demonstrating improved perceived functional ability    Baseline FOTO 7 on 10/17; 03/15/21: FOTO 12; 05/10/2021 06/21/2021= 1; 09/15/2021= 9; 12/13/2021= Will issue next visit 12/6: 4   Time 12    Period Weeks    Status Not Met          PT LONG TERM GOAL #5   Title Patient will perform sit to stand transfer with appropriate AD and max assist of 2 people with 75% consistency to prepare for pregait activities.    Baseline 09/15/2021= Patient unable to stand well- unable to clear  his bottom off chair with Max assist of 2  persons. 12/13/2021- Goal not appropriate to try yet but will keep and roll over to next cert as shift continues to focus on transfers/standing   Time 12    Period Weeks    Status New        PT LONG TERM GOAL #6  Title Patient will tolerate sitting unsupported demonstrating erect sitting posture for 30 minutes with CGA to demonstrate improved back extensor strength and improved sitting tolerance.   Baseline 12/13/2021= Patient demonstrated unsupported sitting at edge of mat for approx 20 min;  12/29/2021- Patient performed approx 30 min of dynamic sitting activities today.   Time 12   Period Weeks   Status ONGOING         7.  Patient will tolerate 5 minutes or more of standing in sit to stand lift in order to indicate improved lower extremity weightbearing tolerance for progression to standing in parallel bars. Baseline: 1 minute on most recent stand 02/21/22 Goal status: INITIAL       Plan     Clinical Impression Statement Pt arriving late to clinic leading to limited time for PT this date as pt has OT session after PT. Pt demonstrating good trunk control and unsupported session for all of session except for hoyer transfers without LOB. Will progress as able on Wednesday with full PT session to assess further prolonged sitting and tolerance for therex with back unsupported. Pt will continue to benefit from skilled physical therapy intervention to address impairments, improve QOL, and attain therapy goals.    Personal Factors and Comorbidities Comorbidity 3+;Time since onset of injury/illness/exacerbation    Comorbidities CVA, diabetes, Seizures    Examination-Activity Limitations Bathing;Bed Mobility;Bend;Caring for Others;Carry;Dressing;Hygiene/Grooming;Lift;Locomotion Level;Reach Overhead;Self Feeding;Sit;Squat;Stairs;Stand;Transfers;Toileting    Examination-Participation Restrictions Cleaning;Community Activity;Driving;Laundry;Medication Management;Meal Prep;Occupation;Personal  Finances;Shop;Yard Work;Volunteer    Stability/Clinical Decision Making Evolving/Moderate complexity    Rehab Potential Fair    PT Frequency 2x / week    PT Duration 12 weeks    PT Treatment/Interventions ADLs/Self Care Home Management;Cryotherapy;Electrical Stimulation;Moist Heat;Ultrasound;DME Instruction;Gait training;Stair training;Functional mobility training;Therapeutic exercise;Balance training;Patient/family education;Orthotic Fit/Training;Neuromuscular re-education;Wheelchair mobility training;Manual techniques;Passive range of motion;Dry needling;Energy conservation;Taping;Visual/perceptual remediation/compensation;Joint Manipulations    PT Next Visit Plan core strength- sitting at EOM, postural control, sabina sit to stand if able; Continue with progressive LE Strengthening    PT Home Exercise Plan No changes to HEP today    Consulted and Agree with Plan of Care Patient;Family member/caregiver    Family Member Consulted Mom               [place]. Fairly IV, PT, DPT Physical Therapist- [place]  [facility]  04/25/2022, 7:55 PM

## 2022-04-26 NOTE — Therapy (Signed)
Occupational Therapy Treatment Note  Patient Name: Paul Hall MRN: [id number] DOB:17-Apr-1995, 27 y.o., male Today's Date: 04/26/2022  PCP: Dr. Sherwood Gambler REFERRING PROVIDER: Dr. Sherwood Gambler   OT End of Session - 04/26/22 0852     Visit Number 14    Number of Visits 36    Date for OT Re-Evaluation 06/13/22    Authorization Type Progress report period starting 10/06/2021    OT Start Time 1652    OT Stop Time 1730    OT Time Calculation (min) 38 min    Activity Tolerance Patient tolerated treatment well    Behavior During Therapy WFL for tasks assessed/performed                     Past Medical History:  Diagnosis Date   Diabetes mellitus (HCC)    Hypertension    Stroke [facility])    Past Surgical History:  Procedure Laterality Date   IVC FILTER PLACEMENT ([facility] HX)     LEG SURGERY     PEG TUBE PLACEMENT     TRACHEOSTOMY     Patient Active Problem List   Diagnosis Date Noted   Sepsis due to vancomycin resistant Enterococcus species (HCC) 06/06/2019   SIRS (systemic inflammatory response syndrome) (HCC) 06/05/2019   Acute lower UTI 06/05/2019   VRE (vancomycin-resistant Enterococci) infection 06/05/2019   Anemia 06/05/2019   Skin ulcer of sacrum with necrosis of muscle (HCC)    Urinary retention    Type 2 diabetes mellitus without complication, with long-term current use of insulin (HCC)    Tachycardia    Lower extremity edema    Acute metabolic encephalopathy    Obstructive sleep apnea    Morbid obesity with BMI of 60.0-69.9, adult (HCC)    Goals of care, counseling/discussion    Palliative care encounter    Sepsis (HCC) 04/27/2019   H/O insulin dependent diabetes mellitus 04/27/2019   History of CVA with residual deficit 04/27/2019   Seizure disorder (HCC) 04/27/2019   Decubitus ulcer of sacral region, stage 4 (HCC) 04/27/2019    ONSET DATE: 01/2019  REFERRING DIAG: CVA/COVID-19  THERAPY DIAG:  Muscle weakness (generalized)  Rationale for Evaluation  and Treatment Rehabilitation  SUBJECTIVE:   Patient is looking forward to a wrestling event in March.  SUBJECTIVE STATEMENT:  Pt. Reports feeling good today.  Pt accompanied by: self and family member  PERTINENT HISTORY:  Pt. is a 27 y.o. male who was diagnosed with COVID-19, and CVA with resultant quadriplegia. Pt. Was then hospitalized with VRE UTI. PMHx includes: urinary retention, seizure disorder, obstructive sleep disorder, DM Type II, Morbid obesity.   PRECAUTIONS: None  WEIGHT BEARING RESTRICTIONS No  PAIN:  Are you having pain? No  FALLS: Has patient fallen in last 6 months? No  LIVING ENVIRONMENT: Lives with: lives with their family Lives in: House/apartment Stairs: No Level Entry Has following equipment at home: Wheelchair (power) and hoyer lift, sit to stand lift  PLOF: Independent  PATIENT GOALS: To be able to engage in more daily care tasks.  OBJECTIVE:   HAND DOMINANCE: Right  ADLs:  Transfers/ambulation related to ADLs: Eating: Pt. Reports being able to hold standard utensils, and is starting to engage more in self-feeding tasks, hand to mouth patterns. Pt. Reports that he does as much as he can with the task, and family assists  with the remainder of the task. Grooming: Pt. Is able to initiate holding an electric toothbrush, and brush his teeth. Family assists LB Dressing:  Total Assist UE dressing: Pt. is now able to reach up to actively assist with grasping , and pulling his gown down. Toileting:  Total Assist Bathing: MaxA UB, Total assist LB Tub Shower transfers: N/A Equipment: See above    IADLs: Shopping: Relies on family to assist Light housekeeping: Total Assist Meal Prep: Total Assist Community mobility:   Medication management:  Total Assist  Financial management: N/A Handwriting: Not legible: Pt. Is able to hold a pen with the left hand, and initiate marking the page. Pt.'s eye glasses were not available  MOBILITY STATUS:  Power  w/c  POSTURE COMMENTS:  Pt. Requires position changes in his power w/c  ACTIVITY TOLERANCE: Activity tolerance:  Fair  FUNCTIONAL OUTCOME MEASURES: FOTO: 40  TR score: 45  UPPER EXTREMITY ROM     Active ROM Right eval Right 03/21/2022  Left eval Left  03/21/2022  Shoulder flexion 106 scaption 105  Scaption  119 123  Shoulder abduction 114 90  110 115  Shoulder adduction       Shoulder extension       Shoulder internal rotation       Shoulder external rotation       Elbow flexion 120(130) 145  135(135) 145  Elbow extension -45(-35) -22(-35)  -27(-18) -22(145)  Wrist flexion       Wrist extension -30(10) -25(10)  10(50 19(50)  Wrist ulnar deviation       Wrist radial deviation       Wrist pronation       Wrist supination       (Blank rows = not tested)      UPPER EXTREMITY MMT:     Right eval Right 03/21/2022  Left eval Left 03/21/2022  Shoulder flexion 3-/5 scaption 3-/5  3-/5 3/5  Shoulder abduction 3-/5 3-/5  3-/5 3-/5  Shoulder adduction       Shoulder extension       Shoulder internal rotation       Shoulder external rotation       Elbow flexion 3/5 3/5  3/5 3+/5  Elbow extension 2-/5 2/5  2-/5 2/5  Wrist flexion       Wrist extension 2-/5 2-/5  2/5 2+/5  Wrist ulnar deviation       Wrist radial deviation       Wrist pronation       Wrist supination       (Blank rows = not tested)     HAND FUNCTION:  Grip strength: Right: 0 lbs; Left: 0 lbs and Lateral pinch: Right: 5 lbs, Left: 2 lbs  03/21/2022: Lateral pinch: Right: 3.5 lbs, Left: 2 lbs  Bilateral digit PIP/DIP flexion contractures with MP hyperextension with attempts for AROM. Pt. is able to tolerate AROM to the bilateral digits at the initial evaluation however, has a history of pain in the digits.  COORDINATION:  Eval: Pt. is unable to grasp 9-hole test pegs. Pt. is able to initiate grasping larger pegs, and is able to hold a pen in the left hand.  SENSATION: Light touch: Impaired    EDEMA:  N/A  MUSCLE TONE: BUE flexor Spasticity  COGNITIO Overall cognitive status: Continue to assess in functional context  VISION:   Subjective report: Pt. Was not wearing glasses at the time of the initial eval.  Baseline vision: Vision is very limited. Wears glasses all the time Visual history: History of impaired vision following CVA. Pt. Has received treatment through the [facility] low vision rehabilitation program.  VISION ASSESSMENT: Impaired To be further assessed in functional context  PERCEPTION: Impaired   PRAXIS: Impaired: motor planning  OBSERVATIONS:  Pt reports being on Tramadol   TODAY'S TREATMENT:   There. Ex:  Pt. Worked on reps of forearm supination, and elbow flexion with 1# dumbbell hand weight. Pt. Worked on a combing movement patterns for elbow flexion, and forearm supination. Pt. worked on BUE AROM with targeted reaching in all planes.Pt. worked on tracking cor cones, and stacking the 6" tall cones at midline, while stabilizing the stack in midline at the tabletop with the opposite hand Pt. worked with a 2# cuff weight in place on the left wrist, and a 1# cuff weight on the right wrist.    PATIENT EDUCATION: Education details: OT services, POC, Goals Person educated: Patient and Parent Education method: Explanation, Demonstration, Tactile cues, and Verbal cues Education comprehension: verbalized understanding, returned demonstration, verbal cues required, and needs further education   HOME EXERCISE PROGRAM:   Continue ongoing assessment, and continue to provide as needed.     GOALS: Goals reviewed with patient? Yes  SHORT TERM GOALS: Target date:05/03/2022     To assess splint fit, and make appropriate adjustments to promote good skin integrity through the palmar surface of the bilateral hands.  Baseline: 03/23/2022: Pt. is wearing splints a couple of hours at night bilateral resting hand splints. 03/21/2022: Pt. is wearing splints a couple  of hours at night bilateral resting hand splints. Goal status: Continue ongoing assessment as requested, and as needed   LONG TERM GOALS: Target date: 06/13/2022     FOTO score will Improve by 2 points for Pt. perceived improvement with the assessment specific ADL/IADL tasks.  Baseline: Eval: FOTO score: 40,  TR score: 45 Goal status: Ongoing  2.   Pt. will independently perform oral care for 100% of the task after complete set-up. Baseline:03/23/2022: Pt. Is able to initiate and perform oral care for approximately 75% of the task. Pt. Requires assist at proximally at the elbow for through oral care. 03/21/2022: Pt. Is able to initiate and perform oral care for approximately 75% of the task. Pt. Requires assist at proximally at the elbow for through oral care. Eval: Pt. is able to initiate using an electric toothbrush. Pt. requires assist for set-up, and assist for thoroughness, and as he Pt. fatigues. Goal status: Ongoing  3.  Pt. Will be independent with self-feeding for 100% of the meal after complete set-up Baseline: 03/23/2022: Pt. Is able to perform scooping cereal for 75% of the time. Pt. required assist, and support at the left elbow, and Pt. Presents with limited forearm supination when using the spoon, and bringing it towards his mouth. Pt. Is able to use a fork to spear items, and perform the hand to mouth pattern.  03/21/2022: Pt. Is able to perform scooping cereal for 75% of the time. Pt. required assist, and support at the left elbow, and Pt. Presents with limited forearm supination when using the spoon, and bringing it towards his mouth. Pt. Is able to use a fork to spear items, and perform the hand to mouth pattern.  Eval: Pt. is able to hold standard standard utensils. Pt. Performs as much of the task as he, can and has assistance for the remainder. Goal status: Ongoing  4.  Pt. Will improve grasp patterns and consistently grasp 1/2" objects for ADL, and IADL tasks.  Baseline:  03/23/2022: Pt. Is able to grasp 1" objects consistently,and continues to work on the hand  patterns needed to grasp 1/2" objects.03/21/2022: Pt. Is able to grasp 1" objects consistently,and continues to work on the hand patterns needed to grasp 1/2" objects. Eval: Pt. is able to grasp 1" objects intermittently using a lateral grasp pattern. Goal status: Ongoing  5.  Pt. will independently write his name legibly with letter sizes under 2". Baseline: 03/23/2022: Pt. Is able to write his name with modA, however has difficulty with formulating letter sizes less than 2" in size with 50% legibility for the 3 letters of his name.03/21/2022: Pt. Is able to write his name with modA, however has difficulty with formulating letter sizes less than 2" in size with 50% legibility for the 3 letters of his name. Eval: Pt is able to hold a thin marker with his left hand, and formulate a line, and initiate a circular pattern (Pt. without glasses today) Goal status: Ongoing  6. Pt. Will reach up to comb/brush his hair  with minA.  Baseline: 03/23/2022: Pt. is now able to more consistently initiate reaching up to his head with his left hand in preparation for haircare12/18/2023: Pt. is now able to more consistently initiate reaching up to his head with his left hand in preparation for haircare. Eval: Pt. is able to initiate reaching up for hair care with a long handled brush, however is unable to sustain UE's in elevation to perform the task.     Goal status: Ongoing  7. Pt. Will independently navigate the w/c through his environment with minA with visual scanning, and hand placement on the controls.  Baseline: Pt. Requires max cues for scanning through the environment, and moderate cues for hand placement on the controls.     Goal status:New       ASSESSMENT:  CLINICAL IMPRESSION:  Pt. was able to more efficiently navigate his w/c from the PT gym to the OT gym with more swift, accurate, and fluid turns. Minimal  cues required for scanning the environment today. Pt. continues to be able to expand his reach further on both the right, and left side today, and tolerated the 2# weight on the left wrist, and the 1# on the right wrist. Pt. required fewer visual cues for scanning to locate the items in anticipation for reaching. Pt. required occasional support proximally at the elbow. Pt. required multiple rest breaks during each task. Pt. required cues, and assist for hand position with supination. Pt. continues to work on improving BUE functioning, w/c navigation, and visual compensatory strategies to improve, and maximize independence with ADLs, and IADLs.     PERFORMANCE DEFICITS in functional skills including ADLs, IADLs, coordination, dexterity, proprioception, ROM, strength, pain, FMC, GMC, decreased knowledge of use of DME, and UE functional use, cognitive skills including safety awareness, and psychosocial skills including coping strategies, environmental adaptation, habits, and routines and behaviors.   IMPAIRMENTS are limiting patient from ADLs, IADLs, leisure, and social participation.   COMORBIDITIES may have co-morbidities  that affects occupational performance. Patient will benefit from skilled OT to address above impairments and improve overall function.  MODIFICATION OR ASSISTANCE TO COMPLETE EVALUATION: Maximum or significant modification of tasks or assist is necessary to complete an evaluation.  OT OCCUPATIONAL PROFILE AND HISTORY: Comprehensive assessment: Review of records and extensive additional review of physical, cognitive, psychosocial history related to current functional performance.  CLINICAL DECISION MAKING: High - multiple treatment options, significant modification of task necessary  REHAB POTENTIAL: Fair    EVALUATION COMPLEXITY: High    PLAN: OT FREQUENCY: 2 x's a week  OT DURATION:12 weeks  PLANNED INTERVENTIONS: self care/ADL training, therapeutic exercise, therapeutic  activity, neuromuscular re-education, manual therapy, passive range of motion, patient/family education, and cognitive remediation/compensation  RECOMMENDED OTHER SERVICES: PT  CONSULTED AND AGREED WITH PLAN OF CARE: Patient and family member/caregiver  PLAN FOR NEXT SESSION: Initiate treatment   Harrel Carina, MS, OTR/L   Harrel Carina, OT 04/26/2022, 9:00 AM

## 2022-04-27 ENCOUNTER — Encounter: Payer: BC Managed Care – PPO | Admitting: Occupational Therapy

## 2022-04-27 ENCOUNTER — Ambulatory Visit: Payer: BC Managed Care – PPO | Admitting: Occupational Therapy

## 2022-04-27 ENCOUNTER — Ambulatory Visit: Payer: BC Managed Care – PPO

## 2022-04-27 DIAGNOSIS — I6930 Unspecified sequelae of cerebral infarction: Secondary | ICD-10-CM | POA: Diagnosis not present

## 2022-04-27 DIAGNOSIS — M6281 Muscle weakness (generalized): Secondary | ICD-10-CM | POA: Diagnosis not present

## 2022-04-27 DIAGNOSIS — R2689 Other abnormalities of gait and mobility: Secondary | ICD-10-CM | POA: Diagnosis not present

## 2022-04-27 DIAGNOSIS — R269 Unspecified abnormalities of gait and mobility: Secondary | ICD-10-CM | POA: Diagnosis not present

## 2022-04-27 DIAGNOSIS — R262 Difficulty in walking, not elsewhere classified: Secondary | ICD-10-CM | POA: Diagnosis not present

## 2022-04-27 DIAGNOSIS — R2681 Unsteadiness on feet: Secondary | ICD-10-CM

## 2022-04-27 DIAGNOSIS — R278 Other lack of coordination: Secondary | ICD-10-CM | POA: Diagnosis not present

## 2022-04-28 ENCOUNTER — Encounter: Payer: Self-pay | Admitting: Occupational Therapy

## 2022-04-28 NOTE — Therapy (Signed)
Occupational Therapy Treatment Note  Patient Name: Paul Hall MRN: [id number] DOB:07/17/1995, 27 y.o., male Today's Date: 04/28/2022  PCP: Dr. Gerarda Fraction REFERRING PROVIDER: Dr. Gerarda Fraction   OT End of Session - 04/28/22 1530     Visit Number 15    Number of Visits 3    Date for OT Re-Evaluation 06/13/22    Authorization Type Progress report period starting 10/06/2021    OT Start Time 1650    OT Stop Time 1730    OT Time Calculation (min) 40 min    Activity Tolerance Patient tolerated treatment well    Behavior During Therapy WFL for tasks assessed/performed                     Past Medical History:  Diagnosis Date   Diabetes mellitus ([place])    Hypertension    Stroke [facility])    Past Surgical History:  Procedure Laterality Date   IVC FILTER PLACEMENT ([place] HX)     LEG SURGERY     PEG TUBE PLACEMENT     TRACHEOSTOMY     Patient Active Problem List   Diagnosis Date Noted   Sepsis due to vancomycin resistant Enterococcus species ([place]) 06/06/2019   SIRS (systemic inflammatory response syndrome) ([place]) 06/05/2019   Acute lower UTI 06/05/2019   VRE (vancomycin-resistant Enterococci) infection 06/05/2019   Anemia 06/05/2019   Skin ulcer of sacrum with necrosis of muscle ([place])    Urinary retention    Type 2 diabetes mellitus without complication, with long-term current use of insulin ([place])    Tachycardia    Lower extremity edema    Acute metabolic encephalopathy    Obstructive sleep apnea    Morbid obesity with BMI of 60.0-69.9, adult ([place])    Goals of care, counseling/discussion    Palliative care encounter    Sepsis ([place]) 04/27/2019   H/O insulin dependent diabetes mellitus 04/27/2019   History of CVA with residual deficit 04/27/2019   Seizure disorder ([place]) 04/27/2019   Decubitus ulcer of sacral region, stage 4 ([place]) 04/27/2019    ONSET DATE: 01/2019  REFERRING DIAG: CVA/COVID-19  THERAPY DIAG:  Muscle weakness (generalized)  Rationale for Evaluation  and Treatment Rehabilitation  SUBJECTIVE:   Patient continues to look forward to a wrestling event in March.  SUBJECTIVE STATEMENT:  Pt. Reports feeling good today.  Pt accompanied by: self and family member  PERTINENT HISTORY:  Pt. is a 27 y.o. male who was diagnosed with COVID-19, and CVA with resultant quadriplegia. Pt. Was then hospitalized with VRE UTI. PMHx includes: urinary retention, seizure disorder, obstructive sleep disorder, DM Type II, Morbid obesity.   PRECAUTIONS: None  WEIGHT BEARING RESTRICTIONS No  PAIN:  Are you having pain? No  FALLS: Has patient fallen in last 6 months? No  LIVING ENVIRONMENT: Lives with: lives with their family Lives in: House/apartment Stairs: No Level Entry Has following equipment at home: Wheelchair (power) and hoyer lift, sit to stand lift  PLOF: Independent  PATIENT GOALS: To be able to engage in more daily care tasks.  OBJECTIVE:   HAND DOMINANCE: Right  ADLs:  Transfers/ambulation related to ADLs: Eating: Pt. Reports being able to hold standard utensils, and is starting to engage more in self-feeding tasks, hand to mouth patterns. Pt. Reports that he does as much as he can with the task, and family assists  with the remainder of the task. Grooming: Pt. Is able to initiate holding an electric toothbrush, and brush his teeth. Family assists LB  Dressing: Total Assist UE dressing: Pt. is now able to reach up to actively assist with grasping , and pulling his gown down. Toileting:  Total Assist Bathing: MaxA UB, Total assist LB Tub Shower transfers: N/A Equipment: See above    IADLs: Shopping: Relies on family to assist Light housekeeping: Total Assist Meal Prep: Total Assist Community mobility:   Medication management:  Total Assist  Financial management: N/A Handwriting: Not legible: Pt. Is able to hold a pen with the left hand, and initiate marking the page. Pt.'s eye glasses were not available  MOBILITY STATUS:   Power w/c  POSTURE COMMENTS:  Pt. Requires position changes in his power w/c  ACTIVITY TOLERANCE: Activity tolerance:  Fair  FUNCTIONAL OUTCOME MEASURES: FOTO: 40  TR score: 45  UPPER EXTREMITY ROM     Active ROM Right eval Right 03/21/2022  Left eval Left  03/21/2022  Shoulder flexion 106 scaption 105  Scaption  119 123  Shoulder abduction 114 90  110 115  Shoulder adduction       Shoulder extension       Shoulder internal rotation       Shoulder external rotation       Elbow flexion 120(130) 145  135(135) 145  Elbow extension -45(-35) -22(-35)  -27(-18) -22(145)  Wrist flexion       Wrist extension -30(10) -25(10)  10(50 19(50)  Wrist ulnar deviation       Wrist radial deviation       Wrist pronation       Wrist supination       (Blank rows = not tested)      UPPER EXTREMITY MMT:     Right eval Right 03/21/2022  Left eval Left 03/21/2022  Shoulder flexion 3-/5 scaption 3-/5  3-/5 3/5  Shoulder abduction 3-/5 3-/5  3-/5 3-/5  Shoulder adduction       Shoulder extension       Shoulder internal rotation       Shoulder external rotation       Elbow flexion 3/5 3/5  3/5 3+/5  Elbow extension 2-/5 2/5  2-/5 2/5  Wrist flexion       Wrist extension 2-/5 2-/5  2/5 2+/5  Wrist ulnar deviation       Wrist radial deviation       Wrist pronation       Wrist supination       (Blank rows = not tested)     HAND FUNCTION:  Grip strength: Right: 0 lbs; Left: 0 lbs and Lateral pinch: Right: 5 lbs, Left: 2 lbs  03/21/2022: Lateral pinch: Right: 3.5 lbs, Left: 2 lbs  Bilateral digit PIP/DIP flexion contractures with MP hyperextension with attempts for AROM. Pt. is able to tolerate AROM to the bilateral digits at the initial evaluation however, has a history of pain in the digits.  COORDINATION:  Eval: Pt. is unable to grasp 9-hole test pegs. Pt. is able to initiate grasping larger pegs, and is able to hold a pen in the left hand.  SENSATION: Light touch:  Impaired   EDEMA:  N/A  MUSCLE TONE: BUE flexor Spasticity  COGNITIO Overall cognitive status: Continue to assess in functional context  VISION:   Subjective report: Pt. Was not wearing glasses at the time of the initial eval.  Baseline vision: Vision is very limited. Wears glasses all the time Visual history: History of impaired vision following CVA. Pt. Has received [facility] low vision rehabilitation program.  VISION ASSESSMENT: Impaired To be further assessed in functional context  PERCEPTION: Impaired   PRAXIS: Impaired: motor planning  OBSERVATIONS:  Pt reports being on Tramadol   TODAY'S TREATMENT:   There. Ex:  Pt. Worked on reps of forearm supination, and elbow flexion with 1# dumbbell hand weight for 15 reps total, 2# weight for 10 reps total for each. Pt. Worked on a combing movement patterns for elbow flexion, and forearm supination. Pt. worked on BUE AROM with targeted reaching in all planes.Pt. worked on tracking cor cones, and stacking the 6" tall cones at midline, while stabilizing the stack in midline at the tabletop with the opposite hand Pt. worked with a 2# cuff weight in place on the left wrist, and a 1# cuff weight on the right wrist.     PATIENT EDUCATION: Education details: OT services, POC, Goals Person educated: Patient and Parent Education method: Explanation, Demonstration, Tactile cues, and Verbal cues Education comprehension: verbalized understanding, returned demonstration, verbal cues required, and needs further education   HOME EXERCISE PROGRAM:   Continue ongoing assessment, and continue to provide as needed.     GOALS: Goals reviewed with patient? Yes  SHORT TERM GOALS: Target date:05/03/2022     To assess splint fit, and make appropriate adjustments to promote good skin integrity through the palmar surface of the bilateral hands.  Baseline: 03/23/2022: Pt. is wearing splints a couple of hours at night bilateral  resting hand splints. 03/21/2022: Pt. is wearing splints a couple of hours at night bilateral resting hand splints. Goal status: Continue ongoing assessment as requested, and as needed   LONG TERM GOALS: Target date: 06/13/2022     FOTO score will Improve by 2 points for Pt. perceived improvement with the assessment specific ADL/IADL tasks.  Baseline: Eval: FOTO score: 40,  TR score: 45 Goal status: Ongoing  2.   Pt. will independently perform oral care for 100% of the task after complete set-up. Baseline:03/23/2022: Pt. Is able to initiate and perform oral care for approximately 75% of the task. Pt. Requires assist at proximally at the elbow for through oral care. 03/21/2022: Pt. Is able to initiate and perform oral care for approximately 75% of the task. Pt. Requires assist at proximally at the elbow for through oral care. Eval: Pt. is able to initiate using an electric toothbrush. Pt. requires assist for set-up, and assist for thoroughness, and as he Pt. fatigues. Goal status: Ongoing  3.  Pt. Will be independent with self-feeding for 100% of the meal after complete set-up Baseline: 03/23/2022: Pt. Is able to perform scooping cereal for 75% of the time. Pt. required assist, and support at the left elbow, and Pt. Presents with limited forearm supination when using the spoon, and bringing it towards his mouth. Pt. Is able to use a fork to spear items, and perform the hand to mouth pattern.  03/21/2022: Pt. Is able to perform scooping cereal for 75% of the time. Pt. required assist, and support at the left elbow, and Pt. Presents with limited forearm supination when using the spoon, and bringing it towards his mouth. Pt. Is able to use a fork to spear items, and perform the hand to mouth pattern.  Eval: Pt. is able to hold standard standard utensils. Pt. Performs as much of the task as he, can and has assistance for the remainder. Goal status: Ongoing  4.  Pt. Will improve grasp patterns and  consistently grasp 1/2" objects for ADL, and IADL tasks.  Baseline: 03/23/2022: Pt.  Is able to grasp 1" objects consistently,and continues to work on the hand patterns needed to grasp 1/2" objects.03/21/2022: Pt. Is able to grasp 1" objects consistently,and continues to work on the hand patterns needed to grasp 1/2" objects. Eval: Pt. is able to grasp 1" objects intermittently using a lateral grasp pattern. Goal status: Ongoing  5.  Pt. will independently write his name legibly with letter sizes under 2". Baseline: 03/23/2022: Pt. Is able to write his name with modA, however has difficulty with formulating letter sizes less than 2" in size with 50% legibility for the 3 letters of his name.03/21/2022: Pt. Is able to write his name with modA, however has difficulty with formulating letter sizes less than 2" in size with 50% legibility for the 3 letters of his name. Eval: Pt is able to hold a thin marker with his left hand, and formulate a line, and initiate a circular pattern (Pt. without glasses today) Goal status: Ongoing  6. Pt. Will reach up to comb/brush his hair  with minA.  Baseline: 03/23/2022: Pt. is now able to more consistently initiate reaching up to his head with his left hand in preparation for haircare12/18/2023: Pt. is now able to more consistently initiate reaching up to his head with his left hand in preparation for haircare. Eval: Pt. is able to initiate reaching up for hair care with a long handled brush, however is unable to sustain UE's in elevation to perform the task.     Goal status: Ongoing  7. Pt. Will independently navigate the w/c through his environment with minA with visual scanning, and hand placement on the controls.  Baseline: Pt. Requires max cues for scanning through the environment, and moderate cues for hand placement on the controls.     Goal status:New       ASSESSMENT:  CLINICAL IMPRESSION:  Pt. was able to tolerate increased reps with the left forearm  supination, and elbow flexion exercises. Pt. was able to tolerate increased weight from 1# to 2# for them, as well. Pt. Required rest breaks, and verbal cues for breath during the exercises. Pt. continues to be able to expand his reach further on both the right, and left side today, and tolerated the 2# weight on the left wrist, and the 1# on the right wrist. Pt. required fewer visual cues for scanning to locate the items in anticipation for reaching. Pt. required occasional support proximally at the elbow on the right. Pt. required multiple rest breaks during each task. Pt. required cues, and assist for hand position with supination. Pt. continues to work on improving BUE functioning, w/c navigation, and visual compensatory strategies to improve, and maximize independence with ADLs, and IADLs.     PERFORMANCE DEFICITS in functional skills including ADLs, IADLs, coordination, dexterity, proprioception, ROM, strength, pain, [place], [facility], decreased knowledge of use of DME, and UE functional use, cognitive skills including safety awareness, and psychosocial skills including coping strategies, environmental adaptation, habits, and routines and behaviors.   IMPAIRMENTS are limiting patient from ADLs, IADLs, leisure, and social participation.   COMORBIDITIES may have co-morbidities  that affects occupational performance. Patient will benefit from skilled OT to address above impairments and improve overall function.  MODIFICATION OR ASSISTANCE TO COMPLETE EVALUATION: Maximum or significant modification of tasks or assist is necessary to complete an evaluation.  OT OCCUPATIONAL PROFILE AND HISTORY: Comprehensive assessment: Review of records and extensive additional review of physical, cognitive, psychosocial history related to current functional performance.  CLINICAL DECISION MAKING: High - multiple  treatment options, significant modification of task necessary  REHAB POTENTIAL: Fair    EVALUATION COMPLEXITY:  High    PLAN: OT FREQUENCY: 2 x's a week  OT DURATION:12 weeks  PLANNED INTERVENTIONS: self care/ADL training, therapeutic exercise, therapeutic activity, neuromuscular re-education, manual therapy, passive range of motion, patient/family education, and cognitive remediation/compensation  RECOMMENDED OTHER SERVICES: PT  CONSULTED AND AGREED WITH PLAN OF CARE: Patient and family member/caregiver  PLAN FOR NEXT SESSION: Initiate treatment   Olegario Messier, MS, OTR/L   Olegario Messier, OT 04/28/2022, 3:35 PM

## 2022-04-28 NOTE — Therapy (Signed)
OUTPATIENT PHYSICAL THERAPY TREATMENT NOTE   Patient Name: Paul Hall MRN: [id number] DOB:[birth year], 27 y.o., male Today's Date: 04/28/2022  PCP:  Cindi Carbon PROVIDER:  Lenise Herald, PA-C   PT End of Session - 04/28/22 0849     Visit Number 83    Number of Visits 99    Date for PT Re-Evaluation 06/01/22    Authorization Type [employer] and [employer]- Need auth past 12th visit    Authorization Time Period 01/04/21-03/29/21; Recert 03/24/2021-06/16/2021; Recert 09/15/2021- 12/08/2021; Recert 12/13/2021-03/07/2022    Progress Note Due on Visit 80    PT Start Time 1604    PT Stop Time 1644    PT Time Calculation (min) 40 min    Equipment Utilized During Treatment Gait belt   Hoyer lift   Activity Tolerance Patient tolerated treatment well   queasiness/nausea   Behavior During Therapy WFL for tasks assessed/performed                       Past Medical History:  Diagnosis Date   Diabetes mellitus (HCC)    Hypertension    Stroke [facility])    Past Surgical History:  Procedure Laterality Date   IVC FILTER PLACEMENT ([facility] HX)     LEG SURGERY     PEG TUBE PLACEMENT     TRACHEOSTOMY     Patient Active Problem List   Diagnosis Date Noted   Sepsis due to vancomycin resistant Enterococcus species (HCC) 06/06/2019   SIRS (systemic inflammatory response syndrome) (HCC) 06/05/2019   Acute lower UTI 06/05/2019   VRE (vancomycin-resistant Enterococci) infection 06/05/2019   Anemia 06/05/2019   Skin ulcer of sacrum with necrosis of muscle (HCC)    Urinary retention    Type 2 diabetes mellitus without complication, with long-term current use of insulin (HCC)    Tachycardia    Lower extremity edema    Acute metabolic encephalopathy    Obstructive sleep apnea    Morbid obesity with BMI of 60.0-69.9, adult (HCC)    Goals of care, counseling/discussion    Palliative care encounter    Sepsis (HCC) 04/27/2019   H/O insulin dependent diabetes mellitus 04/27/2019    History of CVA with residual deficit 04/27/2019   Seizure disorder (HCC) 04/27/2019   Decubitus ulcer of sacral region, stage 4 (HCC) 04/27/2019    REFERRING DIAG: Cerebral infarction, unspecified   THERAPY DIAG:  Muscle weakness (generalized)  Difficulty in walking, not elsewhere classified  History of CVA with residual deficit  Unsteadiness on feet  Abnormality of gait and mobility  Rationale for Evaluation and Treatment Rehabilitation  PERTINENT HISTORY: Paul Hall is a 25yoM who presents with severe weakness, quadriparesis, altered sensorium, and visual impairment s/p critical illness and prolonged hospitalization. Pt hospitalized in October 2020 with ARDS 2/2 COVID19 infection. Pt sustained a complex and lengthy hospitalization which included tracheostomy, prolonged sedation, ECMO. In this period pt sustained CVA and SDH. Pt has now been liberated from tracheostomy and G-tube. Pt has since been hospitalized for wound infection and UTI. Pt lives with parents at home, has hospital bed and left chair, hoyer lift transfers, and power WC for mobility needs. Pt needs heavy physical assistance with ADL 2/2 BUE contractures and motor dysfunction   PRECAUTIONS: Fall  SUBJECTIVE: Pt arriving to PT with his mother in good spirits. No new concerns or complaints.   Patient reports PAIN:  Are you having pain? No   TODAY'S TREATMENT:   There.ex:    Utilization of  hoyer lift to transfer pt to<> from power w/c to<> from mat table placed in chair position at beginning then ending of session. +2 assist for safe transfer.    ~ 2 minutes unsupported sitting for core engagement, close supervision. Pt performing without UE support   LAQ's: x15/LE, good ability to stay upright at trunk and not demonstrate posterior bias. 2# On RLE and 3# on LLE.   Trunk extension to anterior lean/trunk flexion performing modified crunch for rectus abdominus strengthening. Light HHA+2 for stability but pt not  needing physical assist. HHA+2 for anterior trunk lean over edge of mat table for pre STS transfer.  Seated AROM hip flexion for core engagment and LE strengthening, dynamic sitting balance. X15/LE.   R/L trunk rotations for oblique activation: x15/side close supervision   Chest press with PVC pipe needing assist for grip during motion. Chest press with light resistance to light manual resistance into scap retraction.  X15/direction  Activity Description: RUE and LUE forward reaching to blaze pods placed on tray table for UE mobility, anterior trunk lean, coordination. Activity Setting:  Random Number of Pods:  4 Cycles/Sets:  4 Duration (Time or Hit Count):  10  The Blaze Pod Random setting was chosen to enhance cognitive processing and agility, providing an unpredictable environment to simulate real-world scenarios, and fostering quick reactions and adaptability.          PATIENT EDUCATION: Education details: posture cues with sitting Person educated: Patient Education method: Explanation, Demonstration, Tactile cues, and Verbal cues Education comprehension: verbalized understanding, returned demonstration, verbal cues required, tactile cues required, and needs further education   HOME EXERCISE PROGRAM:  Access Code: [email or web address] URL: [email or web address] Date: 03/23/2022 Prepared by: Sande Brothers  Exercises - Supine Bridge  - 3 x weekly - 3 sets - 10 reps - 2 hold - Supine Gluteal Sets  - 3 x weekly - 3 sets - 10 reps - 5 sec hold - Supine Quad Set  - 1 x daily - 3 x weekly - 3 sets - 10 reps - 5 hold - Seated Long Arc Quad  - 1 x daily - 7 x weekly - 3 sets - 10 reps - Seated Hip Adduction Squeeze with Ball  - 1 x daily - 3 x weekly - 3 sets - 10 reps - 5 hold - Seated Hip Abduction  - 1 x daily - 3 x weekly - 3 sets - 10 reps - 2 hold    PT Short Term Goals -       PT SHORT TERM GOAL #1   Title Pt will be independent with HEP in order to improve  strength and balance in order to decrease fall risk and improve function at home and work.    Baseline 01/04/2021= No formal HEP in place; 12/12 no HEP in place; 05/10/2021-Patient and his father were able to report compliance with curent HEP consisting of mostly seated/reclined LE strengthening. Both verbalize no questions at this time.    Time 6    Period Weeks    Status Achieved    Target Date 02/15/21              PT Long Term Goals - Date for completion: 06/01/2022         PT LONG TERM GOAL #1   Title Patient will increase BLE gross strength by 1/2 muscle grade to improve functional strength for improved independence with potential gait, increased standing tolerance and increased ADL ability.    Baseline 01/04/2021-  Patient presents with 1/5 to 3-/5 B LE strength with MMT; 12/12: goal partially met for Left knee/hip; 05/10/2021= 2-/5 bilateal Hip flex; 3+/5 bilateral Knee ext; 06/21/2021= Patient presents with 2-/5 bilateral Hip flex; 3+/5 bilateral knee ext/flex; 2-/5 left ankle DF; 0/5 right ankle- and able to increase reps and resistance with LE's. 09/15/2021- Patient technically presents with 2-/5 B hip flex/abd/add - but he is able to raise his hip up to approx 100 deg which has improved. 3+/5 Bilateral knee ext, 2-/5 left ankle and 0/5 right ankle.  12/08/2021= Patient able to lift left knee at 110 deg of hip flex; presents with 3+/5 knee ext, 2-/5 left ankle DF and 0/5 right ankle DF, 2-/5 bilateral Hip abd in seated position.   12/6: R: knee 3+/5 ext, 2/5 flexion, left knee 3+/5 extension, 3+/5 flexion, R hip: 2+/5 hip add, 2+/5 hip ABD L hip: 4-/5 hip ABD, 3+/5 hip ADD, 3+/5 hip flexion    Time 12    Period Weeks    Status Partially met, met with exception of ankle musculature on the R LE    Target Date 03/07/2022     PT LONG TERM GOAL #2   Title Patient will tolerate sitting unsupported demonstrating erect sitting posture for 15 minutes with CGA to demonstrate improved back extensor  strength and improved sitting tolerance.    Baseline 01/04/2021- Patient confied to sitting in lift chair or electric power chair with back support and unable to sit upright without physical assistance; 12/12: tolerates <1 minutes upright unsupported sitting. 05/10/2021=static sit with forward trunk lean  in his power wheelchair without back support x approx 3 min. 06/21/2021=Unable to assess today due to patient with acute back pain but on previous visit able to sit x 8 min without back support. 09/15/2021- on last visit- 09/13/2021- patient was able to sit unsupported x 8 min at edge of mat. 10/13/2023 - Patient was able to sit at edge of mat with varying level of assist today from SBA to min A for a total of 20 min. 12/13/2021= Patient demonstrated unsupported sitting at edge of mat for approx 20 min   Time 12    Period Weeks    Status GOAL MET   Target Date 12/08/21      PT LONG TERM GOAL #3   Title Patient will demonstrate ability to perform static standing in // bars > 2 min with Max Assist  without loss of balance and fair posture for improved overall strength for pre-gait and transfer activities.    Baseline 01/04/2021= Patient current uanble to stand- Dependent on hoyer or sit to stand lift for transfers. 05/10/2021=Not appropriate yet- Currently still dependent with all transfers using hoyer. 06/21/2021= Patient continuing now to focus on LE strengthening to prepare for standing-unable to try today due to acute low back pain-  planning on attempting in new cert period. 09/15/2021- Patient has attempted standing 2x in past two week- max Assist of 2 people - only once was he successful to clearing his bottom from chair - Will continue to be a focus during the new certification. 12/13/2021= Patient has been limited secondary to increased overall low back pain during this certification and will require more time to focus on this goal.  12/6: not assessed this date, will assess at date when 2-3 PTs are present for  assistance    Time 12    Period Weeks    Status On-going          PT LONG  TERM GOAL #4   Title Pt will improve FOTO score by 10 points or more demonstrating improved perceived functional ability    Baseline FOTO 7 on 10/17; 03/15/21: FOTO 12; 05/10/2021 06/21/2021= 1; 09/15/2021= 9; 12/13/2021= Will issue next visit 12/6: 4   Time 12    Period Weeks    Status Not Met          PT LONG TERM GOAL #5   Title Patient will perform sit to stand transfer with appropriate AD and max assist of 2 people with 75% consistency to prepare for pregait activities.    Baseline 09/15/2021= Patient unable to stand well- unable to clear his bottom off chair with Max assist of 2 persons. 12/13/2021- Goal not appropriate to try yet but will keep and roll over to next cert as shift continues to focus on transfers/standing   Time 12    Period Weeks    Status New        PT LONG TERM GOAL #6  Title Patient will tolerate sitting unsupported demonstrating erect sitting posture for 30 minutes with CGA to demonstrate improved back extensor strength and improved sitting tolerance.   Baseline 12/13/2021= Patient demonstrated unsupported sitting at edge of mat for approx 20 min;  12/29/2021- Patient performed approx 30 min of dynamic sitting activities today.   Time 12   Period Weeks   Status ONGOING         7.  Patient will tolerate 5 minutes or more of standing in sit to stand lift in order to indicate improved lower extremity weightbearing tolerance for progression to standing in parallel bars. Baseline: 1 minute on most recent stand 02/21/22 Goal status: INITIAL       Plan     Clinical Impression Statement Continuing PT POC with progression of LE strengthening and dynamic sitting balance. Overall pt tolerating unsupported sitting on edge of mat table ~ 35 minutes with use of ankle weights for LE strengthening and also pre transfer anterior weight shifting to assist in STS transfers in future sessions. Pt without  complaint of LBP with session. Does require some minA on RUE for anterior reaching with blazed pod activities due to his weakness. Pt will continue to benefit from skilled physical therapy intervention to address impairments, improve QOL, and attain therapy goals.     Personal Factors and Comorbidities Comorbidity 3+;Time since onset of injury/illness/exacerbation    Comorbidities CVA, diabetes, Seizures    Examination-Activity Limitations Bathing;Bed Mobility;Bend;Caring for Others;Carry;Dressing;Hygiene/Grooming;Lift;Locomotion Level;Reach Overhead;Self Feeding;Sit;Squat;Stairs;Stand;Transfers;Toileting    Examination-Participation Restrictions Cleaning;Community Activity;Driving;Laundry;Medication Management;Meal Prep;Occupation;Personal Finances;Shop;Yard Work;Volunteer    Stability/Clinical Decision Making Evolving/Moderate complexity    Rehab Potential Fair    PT Frequency 2x / week    PT Duration 12 weeks    PT Treatment/Interventions ADLs/Self Care Home Management;Cryotherapy;Electrical Stimulation;Moist Heat;Ultrasound;DME Instruction;Gait training;Stair training;Functional mobility training;Therapeutic exercise;Balance training;Patient/family education;Orthotic Fit/Training;Neuromuscular re-education;Wheelchair mobility training;Manual techniques;Passive range of motion;Dry needling;Energy conservation;Taping;Visual/perceptual remediation/compensation;Joint Manipulations    PT Next Visit Plan core strength- sitting at EOM, postural control, sabina sit to stand if able; Continue with progressive LE Strengthening    PT Home Exercise Plan No changes to HEP today    Consulted and Agree with Plan of Care Patient;Family member/caregiver    Family Member Consulted Mom               [place]. Fairly IV, PT, DPT Physical Therapist- [place]  [facility]  04/28/2022, 9:00 AM

## 2022-05-02 ENCOUNTER — Ambulatory Visit: Payer: BC Managed Care – PPO | Admitting: Occupational Therapy

## 2022-05-02 ENCOUNTER — Ambulatory Visit: Payer: BC Managed Care – PPO

## 2022-05-02 DIAGNOSIS — R269 Unspecified abnormalities of gait and mobility: Secondary | ICD-10-CM

## 2022-05-02 DIAGNOSIS — R262 Difficulty in walking, not elsewhere classified: Secondary | ICD-10-CM

## 2022-05-02 DIAGNOSIS — M6281 Muscle weakness (generalized): Secondary | ICD-10-CM | POA: Diagnosis not present

## 2022-05-02 DIAGNOSIS — R2681 Unsteadiness on feet: Secondary | ICD-10-CM

## 2022-05-02 DIAGNOSIS — R2689 Other abnormalities of gait and mobility: Secondary | ICD-10-CM | POA: Diagnosis not present

## 2022-05-02 DIAGNOSIS — R278 Other lack of coordination: Secondary | ICD-10-CM | POA: Diagnosis not present

## 2022-05-02 DIAGNOSIS — I6930 Unspecified sequelae of cerebral infarction: Secondary | ICD-10-CM | POA: Diagnosis not present

## 2022-05-02 NOTE — Therapy (Signed)
OUTPATIENT PHYSICAL THERAPY TREATMENT NOTE   Patient Name: Paul Hall MRN: [id number] DOB:[birth year], 27 y.o., male Today's Date: 05/02/2022  PCP:  Loistine Chance PROVIDER:  Collene Mares, PA-C   PT End of Session - 05/02/22 1555     Visit Number 37    Number of Visits 92    Date for PT Re-Evaluation 06/01/22    Authorization Type [employer] and [employer]- Need auth past 12th visit    Authorization Time Period 01/04/21-03/29/21; Recert 17/61/6073-10/12/6267; Recert 4/85/4627- 0/06/5007; Recert 3/81/8299-37/04/6965    Progress Note Due on Visit 66    PT Start Time 1559    PT Stop Time 1642    PT Time Calculation (min) 43 min    Equipment Utilized During Treatment Gait belt   Hoyer lift   Activity Tolerance Patient tolerated treatment well   queasiness/nausea   Behavior During Therapy WFL for tasks assessed/performed                       Past Medical History:  Diagnosis Date   Diabetes mellitus ([place])    Hypertension    Stroke [facility])    Past Surgical History:  Procedure Laterality Date   IVC FILTER PLACEMENT ([place] [facility])     LEG SURGERY     PEG TUBE PLACEMENT     TRACHEOSTOMY     Patient Active Problem List   Diagnosis Date Noted   Sepsis due to vancomycin resistant Enterococcus species ([place]) 06/06/2019   SIRS (systemic inflammatory response syndrome) ([place]) 06/05/2019   Acute lower UTI 06/05/2019   VRE (vancomycin-resistant Enterococci) infection 06/05/2019   Anemia 06/05/2019   Skin ulcer of sacrum with necrosis of muscle ([place])    Urinary retention    Type 2 diabetes mellitus without complication, with long-term current use of insulin ([facility])    Tachycardia    Lower extremity edema    Acute metabolic encephalopathy    Obstructive sleep apnea    Morbid obesity with BMI of 60.0-69.9, adult ([place])    Goals of care, counseling/discussion    Palliative care encounter    Sepsis ([place]) 04/27/2019   H/O insulin dependent diabetes mellitus 04/27/2019    History of CVA with residual deficit 04/27/2019   Seizure disorder ([place]) 04/27/2019   Decubitus ulcer of sacral region, stage 4 ([place]) 04/27/2019    REFERRING DIAG: Cerebral infarction, unspecified   THERAPY DIAG:  Muscle weakness (generalized)  Difficulty in walking, not elsewhere classified  History of CVA with residual deficit  Unsteadiness on feet  Abnormality of gait and mobility  Rationale for Evaluation and Treatment Rehabilitation  PERTINENT HISTORY: Paul Hall is a 25yoM who presents with severe weakness, quadriparesis, altered sensorium, and visual impairment s/p critical illness and prolonged hospitalization. Pt hospitalized in October 2020 with ARDS 2/2 COVID19 infection. Pt sustained a complex and lengthy hospitalization which included tracheostomy, prolonged sedation, ECMO. In this period pt sustained CVA and SDH. Pt has now been liberated from tracheostomy and G-tube. Pt has since been hospitalized for wound infection and UTI. Pt lives with parents at home, has hospital bed and left chair, hoyer lift transfers, and power WC for mobility needs. Pt needs heavy physical assistance with ADL 2/2 BUE contractures and motor dysfunction   PRECAUTIONS: Fall  SUBJECTIVE: Pt arriving to PT requesting to stay in his power chair due to his upset stomach.   Patient reports PAIN:  Are you having pain? No   TODAY'S TREATMENT:   There.ex[place] Ship alternating hip  flexion: 2x12/LE   LAQ: 2# on RLE, 3# on LLE. 2x12/LE   Trunk extension to anterior lean/trunk flexion performing modified crunch for rectus abdominus strengthening. Light HHA+2 for stability but pt not needing physical assist. X15   Trunk rotations with back unsupported in power w/c: x12/LE   Chest press with PVC pipe needing assist for grip during motion. Chest press with light resistance to light manual resistance into scap retraction.  2X15/direction with 2# AW attached.    Activity Description: RUE and LUE  forward reaching to blaze pods placed on tray table for UE mobility, anterior trunk lean, coordination. Activity Setting:  Random Number of Pods:  4 Cycles/Sets:  4 Duration (Time or Hit Count):  10  The Blaze Pod Random setting was chosen to enhance cognitive processing and agility, providing an unpredictable environment to simulate real-world scenarios, and fostering quick reactions and adaptability.        PATIENT EDUCATION: Education details: posture cues with sitting Person educated: Patient Education method: Explanation, Demonstration, Tactile cues, and Verbal cues Education comprehension: verbalized understanding, returned demonstration, verbal cues required, tactile cues required, and needs further education   HOME EXERCISE PROGRAM:  Access Code: [email or web address] URL: [email or web address] Date: 03/23/2022 Prepared by: Sande Brothers  Exercises - Supine Bridge  - 3 x weekly - 3 sets - 10 reps - 2 hold - Supine Gluteal Sets  - 3 x weekly - 3 sets - 10 reps - 5 sec hold - Supine Quad Set  - 1 x daily - 3 x weekly - 3 sets - 10 reps - 5 hold - Seated Long Arc Quad  - 1 x daily - 7 x weekly - 3 sets - 10 reps - Seated Hip Adduction Squeeze with Ball  - 1 x daily - 3 x weekly - 3 sets - 10 reps - 5 hold - Seated Hip Abduction  - 1 x daily - 3 x weekly - 3 sets - 10 reps - 2 hold    PT Short Term Goals -       PT SHORT TERM GOAL #1   Title Pt will be independent with HEP in order to improve strength and balance in order to decrease fall risk and improve function at home and work.    Baseline 01/04/2021= No formal HEP in place; 12/12 no HEP in place; 05/10/2021-Patient and his father were able to report compliance with curent HEP consisting of mostly seated/reclined LE strengthening. Both verbalize no questions at this time.    Time 6    Period Weeks    Status Achieved    Target Date 02/15/21              PT Long Term Goals - Date for completion: 06/01/2022          PT LONG TERM GOAL #1   Title Patient will increase BLE gross strength by 1/2 muscle grade to improve functional strength for improved independence with potential gait, increased standing tolerance and increased ADL ability.    Baseline 01/04/2021- Patient presents with 1/5 to 3-/5 B LE strength with MMT; 12/12: goal partially met for Left knee/hip; 05/10/2021= 2-/5 bilateal Hip flex; 3+/5 bilateral Knee ext; 06/21/2021= Patient presents with 2-/5 bilateral Hip flex; 3+/5 bilateral knee ext/flex; 2-/5 left ankle DF; 0/5 right ankle- and able to increase reps and resistance with LE's. 09/15/2021- Patient technically presents with 2-/5 B hip flex/abd/add - but he is able to raise his hip up to approx 100 deg which has improved.  3+/5 Bilateral knee ext, 2-/5 left ankle and 0/5 right ankle.  12/08/2021= Patient able to lift left knee at 110 deg of hip flex; presents with 3+/5 knee ext, 2-/5 left ankle DF and 0/5 right ankle DF, 2-/5 bilateral Hip abd in seated position.   12/6: R: knee 3+/5 ext, 2/5 flexion, left knee 3+/5 extension, 3+/5 flexion, R hip: 2+/5 hip add, 2+/5 hip ABD L hip: 4-/5 hip ABD, 3+/5 hip ADD, 3+/5 hip flexion    Time 12    Period Weeks    Status Partially met, met with exception of ankle musculature on the R LE    Target Date 03/07/2022     PT LONG TERM GOAL #2   Title Patient will tolerate sitting unsupported demonstrating erect sitting posture for 15 minutes with CGA to demonstrate improved back extensor strength and improved sitting tolerance.    Baseline 01/04/2021- Patient confied to sitting in lift chair or electric power chair with back support and unable to sit upright without physical assistance; 12/12: tolerates <1 minutes upright unsupported sitting. 05/10/2021=static sit with forward trunk lean  in his power wheelchair without back support x approx 3 min. 06/21/2021=Unable to assess today due to patient with acute back pain but on previous visit able to sit x 8 min without  back support. 09/15/2021- on last visit- 09/13/2021- patient was able to sit unsupported x 8 min at edge of mat. 10/13/2023 - Patient was able to sit at edge of mat with varying level of assist today from SBA to min A for a total of 20 min. 12/13/2021= Patient demonstrated unsupported sitting at edge of mat for approx 20 min   Time 12    Period Weeks    Status GOAL MET   Target Date 12/08/21      PT LONG TERM GOAL #3   Title Patient will demonstrate ability to perform static standing in // bars > 2 min with Max Assist  without loss of balance and fair posture for improved overall strength for pre-gait and transfer activities.    Baseline 01/04/2021= Patient current uanble to stand- Dependent on hoyer or sit to stand lift for transfers. 05/10/2021=Not appropriate yet- Currently still dependent with all transfers using hoyer. 06/21/2021= Patient continuing now to focus on LE strengthening to prepare for standing-unable to try today due to acute low back pain-  planning on attempting in new cert period. 09/15/2021- Patient has attempted standing 2x in past two week- max Assist of 2 people - only once was he successful to clearing his bottom from chair - Will continue to be a focus during the new certification. 12/13/2021= Patient has been limited secondary to increased overall low back pain during this certification and will require more time to focus on this goal.  12/6: not assessed this date, will assess at date when 2-3 PTs are present for assistance    Time 12    Period Weeks    Status On-going          PT LONG TERM GOAL #4   Title Pt will improve FOTO score by 10 points or more demonstrating improved perceived functional ability    Baseline FOTO 7 on 10/17; 03/15/21: FOTO 12; 05/10/2021 06/21/2021= 1; 09/15/2021= 9; 12/13/2021= Will issue next visit 12/6: 4   Time 12    Period Weeks    Status Not Met          PT LONG TERM GOAL #5   Title Patient will perform sit to stand  transfer with appropriate AD and  max assist of 2 people with 75% consistency to prepare for pregait activities.    Baseline 09/15/2021= Patient unable to stand well- unable to clear his bottom off chair with Max assist of 2 persons. 12/13/2021- Goal not appropriate to try yet but will keep and roll over to next cert as shift continues to focus on transfers/standing   Time 12    Period Weeks    Status New        PT LONG TERM GOAL #6  Title Patient will tolerate sitting unsupported demonstrating erect sitting posture for 30 minutes with CGA to demonstrate improved back extensor strength and improved sitting tolerance.   Baseline 12/13/2021= Patient demonstrated unsupported sitting at edge of mat for approx 20 min;  12/29/2021- Patient performed approx 30 min of dynamic sitting activities today.   Time 12   Period Weeks   Status ONGOING         7.  Patient will tolerate 5 minutes or more of standing in sit to stand lift in order to indicate improved lower extremity weightbearing tolerance for progression to standing in parallel bars. Baseline: 1 minute on most recent stand 02/21/22 Goal status: INITIAL       Plan     Clinical Impression Statement Pt requesting staying in power w/c this date due to upset stomach. Performing truncal, UE, and LE strengthening in chair this date. Pt tolerating increased resistance with chest press and scap retraction with 2# AW with only assist needed for his grip. Will continue PT POC as pt is able pending improvement in his stomach discomfort. Pt remaining highly motivated throughout his sessions. Pt will continue to benefit from skilled physical therapy intervention to address impairments, improve QOL, and attain therapy goals.    Personal Factors and Comorbidities Comorbidity 3+;Time since onset of injury/illness/exacerbation    Comorbidities CVA, diabetes, Seizures    Examination-Activity Limitations Bathing;Bed Mobility;Bend;Caring for Others;Carry;Dressing;Hygiene/Grooming;Lift;Locomotion  Level;Reach Overhead;Self Feeding;Sit;Squat;Stairs;Stand;Transfers;Toileting    Examination-Participation Restrictions Cleaning;Community Activity;Driving;Laundry;Medication Management;Meal Prep;Occupation;Personal Finances;Shop;Yard Work;Volunteer    Stability/Clinical Decision Making Evolving/Moderate complexity    Rehab Potential Fair    PT Frequency 2x / week    PT Duration 12 weeks    PT Treatment/Interventions ADLs/Self Care Home Management;Cryotherapy;Electrical Stimulation;Moist Heat;Ultrasound;DME Instruction;Gait training;Stair training;Functional mobility training;Therapeutic exercise;Balance training;Patient/family education;Orthotic Fit/Training;Neuromuscular re-education;Wheelchair mobility training;Manual techniques;Passive range of motion;Dry needling;Energy conservation;Taping;Visual/perceptual remediation/compensation;Joint Manipulations    PT Next Visit Plan core strength- sitting at EOM, postural control, sabina sit to stand if able; Continue with progressive LE Strengthening    PT Home Exercise Plan No changes to HEP today    Consulted and Agree with Plan of Care Patient;Family member/caregiver    Family Member Consulted Mom               [place]. Fairly IV, PT, DPT Physical Therapist- [facility]  05/02/2022, 4:53 PM

## 2022-05-03 NOTE — Therapy (Signed)
Occupational Therapy Treatment Note  Patient Name: Paul Hall MRN: [id number] DOB:[birth year], 27 y.o., male Today's Date: 05/03/2022  PCP: Dr. Gerarda Fraction REFERRING PROVIDER: Dr. Gerarda Fraction   OT End of Session - 05/03/22 0849     Visit Number 16    Number of Visits 3    Date for OT Re-Evaluation 06/13/22    Authorization Type Progress report period starting 10/06/2021    OT Start Time 1645    OT Stop Time 1730    OT Time Calculation (min) 45 min    Activity Tolerance Patient tolerated treatment well    Behavior During Therapy WFL for tasks assessed/performed                     Past Medical History:  Diagnosis Date   Diabetes mellitus ([place])    Hypertension    Stroke [facility])    Past Surgical History:  Procedure Laterality Date   IVC FILTER PLACEMENT ([place] [facility]X)     LEG SURGERY     PEG TUBE PLACEMENT     TRACHEOSTOMY     Patient Active Problem List   Diagnosis Date Noted   Sepsis due to vancomycin resistant Enterococcus species ([place]) 06/06/2019   SIRS (systemic inflammatory response syndrome) ([place]) 06/05/2019   Acute lower UTI 06/05/2019   VRE (vancomycin-resistant Enterococci) infection 06/05/2019   Anemia 06/05/2019   Skin ulcer of sacrum with necrosis of muscle ([place])    Urinary retention    Type 2 diabetes mellitus without complication, with long-term current use of insulin ([place])    Tachycardia    Lower extremity edema    Acute metabolic encephalopathy    Obstructive sleep apnea    Morbid obesity with BMI of 60.0-69.9, adult ([place])    Goals of care, counseling/discussion    Palliative care encounter    Sepsis ([place]) 04/27/2019   H/O insulin dependent diabetes mellitus 04/27/2019   History of CVA with residual deficit 04/27/2019   Seizure disorder ([place]) 04/27/2019   Decubitus ulcer of sacral region, stage 4 ([place]) 04/27/2019    ONSET DATE: 01/2019  REFERRING DIAG: CVA/COVID-19  THERAPY DIAG:  Muscle weakness (generalized)  Rationale for Evaluation  and Treatment Rehabilitation  SUBJECTIVE:   Patient continues to look forward to a wrestling event in March.  SUBJECTIVE STATEMENT:   Pt. reports having an upset stomach/stomach issues today.   Pt accompanied by: self and family member  PERTINENT HISTORY:  Pt. is a 27 y.o. male who was diagnosed with COVID-19, and CVA with resultant quadriplegia. Pt. Was then hospitalized with VRE UTI. PMHx includes: urinary retention, seizure disorder, obstructive sleep disorder, DM Type II, Morbid obesity.   PRECAUTIONS: None  WEIGHT BEARING RESTRICTIONS No  PAIN:  Are you having pain? No  FALLS: Has patient fallen in last 6 months? No  LIVING ENVIRONMENT: Lives with: lives with their family Lives in: House/apartment Stairs: No Level Entry Has following equipment at home: Wheelchair (power) and hoyer lift, sit to stand lift  PLOF: Independent  PATIENT GOALS: To be able to engage in more daily care tasks.  OBJECTIVE:   HAND DOMINANCE: Right  ADLs:  Transfers/ambulation related to ADLs: Eating: Pt. Reports being able to hold standard utensils, and is starting to engage more in self-feeding tasks, hand to mouth patterns. Pt. Reports that he does as much as he can with the task, and family assists  with the remainder of the task. Grooming: Pt. Is able to initiate holding an electric toothbrush, and brush  his teeth. Family assists LB Dressing: Total Assist UE dressing: Pt. is now able to reach up to actively assist with grasping , and pulling his gown down. Toileting:  Total Assist Bathing: MaxA UB, Total assist LB Tub Shower transfers: N/A Equipment: See above    IADLs: Shopping: Relies on family to assist Light housekeeping: Total Assist Meal Prep: Total Assist Community mobility:   Medication management:  Total Assist  Financial management: N/A Handwriting: Not legible: Pt. Is able to hold a pen with the left hand, and initiate marking the page. Pt.'s eye glasses were not  available  MOBILITY STATUS:  Power w/c  POSTURE COMMENTS:  Pt. Requires position changes in his power w/c  ACTIVITY TOLERANCE: Activity tolerance:  Fair  FUNCTIONAL OUTCOME MEASURES: FOTO: 40  TR score: 45  UPPER EXTREMITY ROM     Active ROM Right eval Right 03/21/2022  Left eval Left  03/21/2022  Shoulder flexion 106 scaption 105  Scaption  119 123  Shoulder abduction 114 90  110 115  Shoulder adduction       Shoulder extension       Shoulder internal rotation       Shoulder external rotation       Elbow flexion 120(130) 145  135(135) 145  Elbow extension -45(-35) -22(-35)  -27(-18) -22(145)  Wrist flexion       Wrist extension -30(10) -25(10)  10(50 19(50)  Wrist ulnar deviation       Wrist radial deviation       Wrist pronation       Wrist supination       (Blank rows = not tested)      UPPER EXTREMITY MMT:     Right eval Right 03/21/2022  Left eval Left 03/21/2022  Shoulder flexion 3-/5 scaption 3-/5  3-/5 3/5  Shoulder abduction 3-/5 3-/5  3-/5 3-/5  Shoulder adduction       Shoulder extension       Shoulder internal rotation       Shoulder external rotation       Elbow flexion 3/5 3/5  3/5 3+/5  Elbow extension 2-/5 2/5  2-/5 2/5  Wrist flexion       Wrist extension 2-/5 2-/5  2/5 2+/5  Wrist ulnar deviation       Wrist radial deviation       Wrist pronation       Wrist supination       (Blank rows = not tested)     HAND FUNCTION:  Grip strength: Right: 0 lbs; Left: 0 lbs and Lateral pinch: Right: 5 lbs, Left: 2 lbs  03/21/2022: Lateral pinch: Right: 3.5 lbs, Left: 2 lbs  Bilateral digit PIP/DIP flexion contractures with MP hyperextension with attempts for AROM. Pt. is able to tolerate AROM to the bilateral digits at the initial evaluation however, has a history of pain in the digits.  COORDINATION:  Eval: Pt. is unable to grasp 9-hole test pegs. Pt. is able to initiate grasping larger pegs, and is able to hold a pen in the left  hand.  SENSATION: Light touch: Impaired   EDEMA:  N/A  MUSCLE TONE: BUE flexor Spasticity  COGNITIO Overall cognitive status: Continue to assess in functional context  VISION:   Subjective report: Pt. was not wearing glasses at the time of the initial eval.  Baseline vision: Vision is very limited. Wears glasses all the time Visual history: History of impaired vision following CVA. Pt. Has received treatment through [facility]  low vision rehabilitation program.   VISION ASSESSMENT: Impaired To be further assessed in functional context  PERCEPTION: Impaired   PRAXIS: Impaired: motor planning  OBSERVATIONS:  Pt reports being on Tramadol   TODAY'S TREATMENT:   There. Ex:  Pt. Worked on slow prolonged stretching of bilateral forearm supination secondary to tightness.  Pt. Worked on the [place] board using a lateral grasp attachment for supination with assist proximally. Pt. Worked on reps of forearm supination with 1# dumbbell hand weight for 2 sets 10 reps  reps total, 2# weight for 2 sets of 10 elbow flexion, and combined elbow flexion with forearm supination. Pt. worked on BUE AROM with targeted reaching in all planes.      PATIENT EDUCATION: Education details: OT services, POC, Goals Person educated: Patient and Parent Education method: Explanation, Demonstration, Tactile cues, and Verbal cues Education comprehension: verbalized understanding, returned demonstration, verbal cues required, and needs further education   HOME EXERCISE PROGRAM:   Continue ongoing assessment, and continue to provide as needed.     GOALS: Goals reviewed with patient? Yes  SHORT TERM GOALS: Target date:05/03/2022     To assess splint fit, and make appropriate adjustments to promote good skin integrity through the palmar surface of the bilateral hands.  Baseline: 03/23/2022: Pt. is wearing splints a couple of hours at night bilateral resting hand splints. 03/21/2022: Pt. is wearing splints a  couple of hours at night bilateral resting hand splints. Goal status: Continue ongoing assessment as requested, and as needed   LONG TERM GOALS: Target date: 06/13/2022     FOTO score will Improve by 2 points for Pt. perceived improvement with the assessment specific ADL/IADL tasks.  Baseline: Eval: FOTO score: 40,  TR score: 45 Goal status: Ongoing  2.   Pt. will independently perform oral care for 100% of the task after complete set-up. Baseline:03/23/2022: Pt. Is able to initiate and perform oral care for approximately 75% of the task. Pt. Requires assist at proximally at the elbow for through oral care. 03/21/2022: Pt. Is able to initiate and perform oral care for approximately 75% of the task. Pt. Requires assist at proximally at the elbow for through oral care. Eval: Pt. is able to initiate using an electric toothbrush. Pt. requires assist for set-up, and assist for thoroughness, and as he Pt. fatigues. Goal status: Ongoing  3.  Pt. Will be independent with self-feeding for 100% of the meal after complete set-up Baseline: 03/23/2022: Pt. Is able to perform scooping cereal for 75% of the time. Pt. required assist, and support at the left elbow, and Pt. Presents with limited forearm supination when using the spoon, and bringing it towards his mouth. Pt. Is able to use a fork to spear items, and perform the hand to mouth pattern.  03/21/2022: Pt. Is able to perform scooping cereal for 75% of the time. Pt. required assist, and support at the left elbow, and Pt. Presents with limited forearm supination when using the spoon, and bringing it towards his mouth. Pt. Is able to use a fork to spear items, and perform the hand to mouth pattern.  Eval: Pt. is able to hold standard standard utensils. Pt. Performs as much of the task as he, can and has assistance for the remainder. Goal status: Ongoing  4.  Pt. Will improve grasp patterns and consistently grasp 1/2" objects for ADL, and IADL tasks.   Baseline: 03/23/2022: Pt. Is able to grasp 1" objects consistently,and continues to work on the hand patterns needed  to grasp 1/2" objects.03/21/2022: Pt. Is able to grasp 1" objects consistently,and continues to work on the hand patterns needed to grasp 1/2" objects. Eval: Pt. is able to grasp 1" objects intermittently using a lateral grasp pattern. Goal status: Ongoing  5.  Pt. will independently write his name legibly with letter sizes under 2". Baseline: 03/23/2022: Pt. Is able to write his name with modA, however has difficulty with formulating letter sizes less than 2" in size with 50% legibility for the 3 letters of his name.03/21/2022: Pt. Is able to write his name with modA, however has difficulty with formulating letter sizes less than 2" in size with 50% legibility for the 3 letters of his name. Eval: Pt is able to hold a thin marker with his left hand, and formulate a line, and initiate a circular pattern (Pt. without glasses today) Goal status: Ongoing  6. Pt. Will reach up to comb/brush his hair  with minA.  Baseline: 03/23/2022: Pt. is now able to more consistently initiate reaching up to his head with his left hand in preparation for haircare12/18/2023: Pt. is now able to more consistently initiate reaching up to his head with his left hand in preparation for haircare. Eval: Pt. is able to initiate reaching up for hair care with a long handled brush, however is unable to sustain UE's in elevation to perform the task.     Goal status: Ongoing  7. Pt. Will independently navigate the w/c through his environment with minA with visual scanning, and hand placement on the controls.  Baseline: Pt. Requires max cues for scanning through the environment, and moderate cues for hand placement on the controls.     Goal status:New       ASSESSMENT:  CLINICAL IMPRESSION:  Pt. was able to tolerate increased reps with the left forearm supination, and elbow flexion exercises following forearm  supination stretches secondary to pronator tightness. Pt. was able to tolerate 2# hand weight for elbow flexion and extension, and combined elbow flexion/extension with forearm supination, 1# weight for forearm supination.Pt. required rest breaks, and verbal cues for breath during the exercises. Pt. required fewer visual cues for scanning to locate the items in anticipation for reaching. Pt. required occasional support proximally at the elbow on the right. Pt. required multiple rest breaks during each task. Pt. required cues, and assist for hand position with supination. Pt. continues to work on improving BUE functioning, w/c navigation, and visual compensatory strategies to improve, and maximize independence with ADLs, and IADLs.     PERFORMANCE DEFICITS in functional skills including ADLs, IADLs, coordination, dexterity, proprioception, ROM, strength, pain, FMC, GMC, decreased knowledge of use of DME, and UE functional use, cognitive skills including safety awareness, and psychosocial skills including coping strategies, environmental adaptation, habits, and routines and behaviors.   IMPAIRMENTS are limiting patient from ADLs, IADLs, leisure, and social participation.   COMORBIDITIES may have co-morbidities  that affects occupational performance. Patient will benefit from skilled OT to address above impairments and improve overall function.  MODIFICATION OR ASSISTANCE TO COMPLETE EVALUATION: Maximum or significant modification of tasks or assist is necessary to complete an evaluation.  OT OCCUPATIONAL PROFILE AND HISTORY: Comprehensive assessment: Review of records and extensive additional review of physical, cognitive, psychosocial history related to current functional performance.  CLINICAL DECISION MAKING: High - multiple treatment options, significant modification of task necessary  REHAB POTENTIAL: Fair    EVALUATION COMPLEXITY: High    PLAN: OT FREQUENCY: 2 x's a week  OT DURATION:12  weeks  PLANNED INTERVENTIONS: self care/ADL training, therapeutic exercise, therapeutic activity, neuromuscular re-education, manual therapy, passive range of motion, patient/family education, and cognitive remediation/compensation  RECOMMENDED OTHER SERVICES: PT  CONSULTED AND AGREED WITH PLAN OF CARE: Patient and family member/caregiver  PLAN FOR NEXT SESSION: Initiate treatment   Harrel Carina, MS, OTR/L   Harrel Carina, OT 05/03/2022, 8:53 AM

## 2022-05-04 ENCOUNTER — Ambulatory Visit: Payer: BC Managed Care – PPO

## 2022-05-04 ENCOUNTER — Ambulatory Visit: Payer: BC Managed Care – PPO | Admitting: Occupational Therapy

## 2022-05-04 ENCOUNTER — Encounter: Payer: BC Managed Care – PPO | Admitting: Occupational Therapy

## 2022-05-04 NOTE — Therapy (Incomplete)
OUTPATIENT PHYSICAL THERAPY TREATMENT NOTE   Patient Name: Paul Hall MRN: [id number] DOB:[birth year], 27 y.o., male Today's Date: 05/02/2022  PCP:  Paul Hall PROVIDER:  Collene Mares, PA-C   PT End of Session - 05/02/22 1555     Visit Number 37    Number of Visits 92    Date for PT Re-Evaluation 06/01/22    Authorization Type [employer] and [employer]- Need auth past 12th visit    Authorization Time Period 01/04/21-03/29/21; Recert 17/61/6073-10/12/6267; Recert 4/85/4627- 0/06/5007; Recert 3/81/8299-37/04/6965    Progress Note Due on Visit 66    PT Start Time 1559    PT Stop Time 1642    PT Time Calculation (min) 43 min    Equipment Utilized During Treatment Gait belt   Hoyer lift   Activity Tolerance Patient tolerated treatment well   queasiness/nausea   Behavior During Therapy WFL for tasks assessed/performed                       Past Medical History:  Diagnosis Date   Diabetes mellitus ([place])    Hypertension    Stroke [facility])    Past Surgical History:  Procedure Laterality Date   IVC FILTER PLACEMENT ([place] [facility])     LEG SURGERY     PEG TUBE PLACEMENT     TRACHEOSTOMY     Patient Active Problem List   Diagnosis Date Noted   Sepsis due to vancomycin resistant Enterococcus species ([place]) 06/06/2019   SIRS (systemic inflammatory response syndrome) ([place]) 06/05/2019   Acute lower UTI 06/05/2019   VRE (vancomycin-resistant Enterococci) infection 06/05/2019   Anemia 06/05/2019   Skin ulcer of sacrum with necrosis of muscle ([place])    Urinary retention    Type 2 diabetes mellitus without complication, with long-term current use of insulin ([facility])    Tachycardia    Lower extremity edema    Acute metabolic encephalopathy    Obstructive sleep apnea    Morbid obesity with BMI of 60.0-69.9, adult ([place])    Goals of care, counseling/discussion    Palliative care encounter    Sepsis ([place]) 04/27/2019   H/O insulin dependent diabetes mellitus 04/27/2019    History of CVA with residual deficit 04/27/2019   Seizure disorder ([place]) 04/27/2019   Decubitus ulcer of sacral region, stage 4 ([place]) 04/27/2019    REFERRING DIAG: Cerebral infarction, unspecified   THERAPY DIAG:  Muscle weakness (generalized)  Difficulty in walking, not elsewhere classified  History of CVA with residual deficit  Unsteadiness on feet  Abnormality of gait and mobility  Rationale for Evaluation and Treatment Rehabilitation  PERTINENT HISTORY: Paul Hall is a 25yoM who presents with severe weakness, quadriparesis, altered sensorium, and visual impairment s/p critical illness and prolonged hospitalization. Pt hospitalized in October 2020 with ARDS 2/2 COVID19 infection. Pt sustained a complex and lengthy hospitalization which included tracheostomy, prolonged sedation, ECMO. In this period pt sustained CVA and SDH. Pt has now been liberated from tracheostomy and G-tube. Pt has since been hospitalized for wound infection and UTI. Pt lives with parents at home, has hospital bed and left chair, hoyer lift transfers, and power WC for mobility needs. Pt needs heavy physical assistance with ADL 2/2 BUE contractures and motor dysfunction   PRECAUTIONS: Fall  SUBJECTIVE: Pt arriving to PT requesting to stay in his power chair due to his upset stomach.   Patient reports PAIN:  Are you having pain? No   TODAY'S TREATMENT:   There.exSinclair Hall alternating hip  flexion: 2x12/LE   LAQ: 2# on RLE, 3# on LLE. 2x12/LE   Trunk extension to anterior lean/trunk flexion performing modified crunch for rectus abdominus strengthening. Light HHA+2 for stability but pt not needing physical assist. X15   Trunk rotations with back unsupported in power w/c: x12/LE   Chest press with PVC pipe needing assist for grip during motion. Chest press with light resistance to light manual resistance into scap retraction.  2X15/direction with 2# AW attached.    Activity Description: RUE and LUE  forward reaching to blaze pods placed on tray table for UE mobility, anterior trunk lean, coordination. Activity Setting:  Random Number of Pods:  4 Cycles/Sets:  4 Duration (Time or Hit Count):  10  The Blaze Pod Random setting was chosen to enhance cognitive processing and agility, providing an unpredictable environment to simulate real-world scenarios, and fostering quick reactions and adaptability.        PATIENT EDUCATION: Education details: posture cues with sitting Person educated: Patient Education method: Explanation, Demonstration, Tactile cues, and Verbal cues Education comprehension: verbalized understanding, returned demonstration, verbal cues required, tactile cues required, and needs further education   HOME EXERCISE PROGRAM:  Access Code: [email or web address] URL: [email or web address] Date: 03/23/2022 Prepared by: Paul Hall  Exercises - Supine Bridge  - 3 x weekly - 3 sets - 10 reps - 2 hold - Supine Gluteal Sets  - 3 x weekly - 3 sets - 10 reps - 5 sec hold - Supine Quad Set  - 1 x daily - 3 x weekly - 3 sets - 10 reps - 5 hold - Seated Long Arc Quad  - 1 x daily - 7 x weekly - 3 sets - 10 reps - Seated Hip Adduction Squeeze with Ball  - 1 x daily - 3 x weekly - 3 sets - 10 reps - 5 hold - Seated Hip Abduction  - 1 x daily - 3 x weekly - 3 sets - 10 reps - 2 hold    PT Short Term Goals -       PT SHORT TERM GOAL #1   Title Pt will be independent with HEP in order to improve strength and balance in order to decrease fall risk and improve function at home and work.    Baseline 01/04/2021= No formal HEP in place; 12/12 no HEP in place; 05/10/2021-Patient and his father were able to report compliance with curent HEP consisting of mostly seated/reclined LE strengthening. Both verbalize no questions at this time.    Time 6    Period Weeks    Status Achieved    Target Date 02/15/21              PT Long Term Goals - Date for completion: 06/01/2022          PT LONG TERM GOAL #1   Title Patient will increase BLE gross strength by 1/2 muscle grade to improve functional strength for improved independence with potential gait, increased standing tolerance and increased ADL ability.    Baseline 01/04/2021- Patient presents with 1/5 to 3-/5 B LE strength with MMT; 12/12: goal partially met for Left knee/hip; 05/10/2021= 2-/5 bilateal Hip flex; 3+/5 bilateral Knee ext; 06/21/2021= Patient presents with 2-/5 bilateral Hip flex; 3+/5 bilateral knee ext/flex; 2-/5 left ankle DF; 0/5 right ankle- and able to increase reps and resistance with LE's. 09/15/2021- Patient technically presents with 2-/5 B hip flex/abd/add - but he is able to raise his hip up to approx 100 deg which has improved.  3+/5 Bilateral knee ext, 2-/5 left ankle and 0/5 right ankle.  12/08/2021= Patient able to lift left knee at 110 deg of hip flex; presents with 3+/5 knee ext, 2-/5 left ankle DF and 0/5 right ankle DF, 2-/5 bilateral Hip abd in seated position.   12/6: R: knee 3+/5 ext, 2/5 flexion, left knee 3+/5 extension, 3+/5 flexion, R hip: 2+/5 hip add, 2+/5 hip ABD L hip: 4-/5 hip ABD, 3+/5 hip ADD, 3+/5 hip flexion    Time 12    Period Weeks    Status Partially met, met with exception of ankle musculature on the R LE    Target Date 03/07/2022     PT LONG TERM GOAL #2   Title Patient will tolerate sitting unsupported demonstrating erect sitting posture for 15 minutes with CGA to demonstrate improved back extensor strength and improved sitting tolerance.    Baseline 01/04/2021- Patient confied to sitting in lift chair or electric power chair with back support and unable to sit upright without physical assistance; 12/12: tolerates <1 minutes upright unsupported sitting. 05/10/2021=static sit with forward trunk lean  in his power wheelchair without back support x approx 3 min. 06/21/2021=Unable to assess today due to patient with acute back pain but on previous visit able to sit x 8 min without  back support. 09/15/2021- on last visit- 09/13/2021- patient was able to sit unsupported x 8 min at edge of mat. 10/13/2023 - Patient was able to sit at edge of mat with varying level of assist today from SBA to min A for a total of 20 min. 12/13/2021= Patient demonstrated unsupported sitting at edge of mat for approx 20 min   Time 12    Period Weeks    Status GOAL MET   Target Date 12/08/21      PT LONG TERM GOAL #3   Title Patient will demonstrate ability to perform static standing in // bars > 2 min with Max Assist  without loss of balance and fair posture for improved overall strength for pre-gait and transfer activities.    Baseline 01/04/2021= Patient current uanble to stand- Dependent on hoyer or sit to stand lift for transfers. 05/10/2021=Not appropriate yet- Currently still dependent with all transfers using hoyer. 06/21/2021= Patient continuing now to focus on LE strengthening to prepare for standing-unable to try today due to acute low back pain-  planning on attempting in new cert period. 09/15/2021- Patient has attempted standing 2x in past two week- max Assist of 2 people - only once was he successful to clearing his bottom from chair - Will continue to be a focus during the new certification. 12/13/2021= Patient has been limited secondary to increased overall low back pain during this certification and will require more time to focus on this goal.  12/6: not assessed this date, will assess at date when 2-3 PTs are present for assistance    Time 12    Period Weeks    Status On-going          PT LONG TERM GOAL #4   Title Pt will improve FOTO score by 10 points or more demonstrating improved perceived functional ability    Baseline FOTO 7 on 10/17; 03/15/21: FOTO 12; 05/10/2021 06/21/2021= 1; 09/15/2021= 9; 12/13/2021= Will issue next visit 12/6: 4   Time 12    Period Weeks    Status Not Met          PT LONG TERM GOAL #5   Title Patient will perform sit to stand  transfer with appropriate AD and  max assist of 2 people with 75% consistency to prepare for pregait activities.    Baseline 09/15/2021= Patient unable to stand well- unable to clear his bottom off chair with Max assist of 2 persons. 12/13/2021- Goal not appropriate to try yet but will keep and roll over to next cert as shift continues to focus on transfers/standing   Time 12    Period Weeks    Status New        PT LONG TERM GOAL #6  Title Patient will tolerate sitting unsupported demonstrating erect sitting posture for 30 minutes with CGA to demonstrate improved back extensor strength and improved sitting tolerance.   Baseline 12/13/2021= Patient demonstrated unsupported sitting at edge of mat for approx 20 min;  12/29/2021- Patient performed approx 30 min of dynamic sitting activities today.   Time 12   Period Weeks   Status ONGOING         7.  Patient will tolerate 5 minutes or more of standing in sit to stand lift in order to indicate improved lower extremity weightbearing tolerance for progression to standing in parallel bars. Baseline: 1 minute on most recent stand 02/21/22 Goal status: INITIAL       Plan     Clinical Impression Statement Pt requesting staying in power w/c this date due to upset stomach. Performing truncal, UE, and LE strengthening in chair this date. Pt tolerating increased resistance with chest press and scap retraction with 2# AW with only assist needed for his grip. Will continue PT POC as pt is able pending improvement in his stomach discomfort. Pt remaining highly motivated throughout his sessions. Pt will continue to benefit from skilled physical therapy intervention to address impairments, improve QOL, and attain therapy goals.    Personal Factors and Comorbidities Comorbidity 3+;Time since onset of injury/illness/exacerbation    Comorbidities CVA, diabetes, Seizures    Examination-Activity Limitations Bathing;Bed Mobility;Bend;Caring for Others;Carry;Dressing;Hygiene/Grooming;Lift;Locomotion  Level;Reach Overhead;Self Feeding;Sit;Squat;Stairs;Stand;Transfers;Toileting    Examination-Participation Restrictions Cleaning;Community Activity;Driving;Laundry;Medication Management;Meal Prep;Occupation;Personal Finances;Shop;Yard Work;Volunteer    Stability/Clinical Decision Making Evolving/Moderate complexity    Rehab Potential Fair    PT Frequency 2x / week    PT Duration 12 weeks    PT Treatment/Interventions ADLs/Self Care Home Management;Cryotherapy;Electrical Stimulation;Moist Heat;Ultrasound;DME Instruction;Gait training;Stair training;Functional mobility training;Therapeutic exercise;Balance training;Patient/family education;Orthotic Fit/Training;Neuromuscular re-education;Wheelchair mobility training;Manual techniques;Passive range of motion;Dry needling;Energy conservation;Taping;Visual/perceptual remediation/compensation;Joint Manipulations    PT Next Visit Plan core strength- sitting at EOM, postural control, sabina sit to stand if able; Continue with progressive LE Strengthening    PT Home Exercise Plan No changes to HEP today    Consulted and Agree with Plan of Care Patient;Family member/caregiver    Family Member Consulted Mom             Louis Meckel, PT Physical Therapist- [facility]  [facility]  05/02/2022, 4:53 PM

## 2022-05-09 ENCOUNTER — Ambulatory Visit: Payer: BC Managed Care – PPO | Admitting: Occupational Therapy

## 2022-05-09 ENCOUNTER — Ambulatory Visit: Payer: BC Managed Care – PPO | Attending: Internal Medicine

## 2022-05-09 DIAGNOSIS — R278 Other lack of coordination: Secondary | ICD-10-CM | POA: Diagnosis not present

## 2022-05-09 DIAGNOSIS — R269 Unspecified abnormalities of gait and mobility: Secondary | ICD-10-CM | POA: Insufficient documentation

## 2022-05-09 DIAGNOSIS — R2681 Unsteadiness on feet: Secondary | ICD-10-CM | POA: Insufficient documentation

## 2022-05-09 DIAGNOSIS — R2689 Other abnormalities of gait and mobility: Secondary | ICD-10-CM | POA: Diagnosis not present

## 2022-05-09 DIAGNOSIS — M6281 Muscle weakness (generalized): Secondary | ICD-10-CM | POA: Insufficient documentation

## 2022-05-09 DIAGNOSIS — I6930 Unspecified sequelae of cerebral infarction: Secondary | ICD-10-CM | POA: Insufficient documentation

## 2022-05-09 DIAGNOSIS — R262 Difficulty in walking, not elsewhere classified: Secondary | ICD-10-CM | POA: Diagnosis not present

## 2022-05-09 NOTE — Therapy (Signed)
OUTPATIENT PHYSICAL THERAPY TREATMENT NOTE   Patient Name: Paul Hall MRN: [id number] DOB:10/06/1995, 27 y.o., male Today's Date: 05/10/2022  PCP:  Cindi Carbon PROVIDER:  Lenise Herald, PA-C   PT End of Session - 05/09/22 1553     Visit Number 85    Number of Visits 99    Date for PT Re-Evaluation 06/01/22    Authorization Type [employer] and [employer]- Need auth past 12th visit    Authorization Time Period 01/04/21-03/29/21; Recert 03/24/2021-06/16/2021; Recert 09/15/2021- 12/08/2021; Recert 12/13/2021-03/07/2022    Progress Note Due on Visit 90    PT Start Time 1558    PT Stop Time 1644    PT Time Calculation (min) 46 min    Equipment Utilized During Treatment Gait belt   Hoyer lift   Activity Tolerance Patient tolerated treatment well   queasiness/nausea   Behavior During Therapy WFL for tasks assessed/performed                       Past Medical History:  Diagnosis Date   Diabetes mellitus (HCC)    Hypertension    Stroke [facility])    Past Surgical History:  Procedure Laterality Date   IVC FILTER PLACEMENT ([facility] HX)     LEG SURGERY     PEG TUBE PLACEMENT     TRACHEOSTOMY     Patient Active Problem List   Diagnosis Date Noted   Sepsis due to vancomycin resistant Enterococcus species (HCC) 06/06/2019   SIRS (systemic inflammatory response syndrome) (HCC) 06/05/2019   Acute lower UTI 06/05/2019   VRE (vancomycin-resistant Enterococci) infection 06/05/2019   Anemia 06/05/2019   Skin ulcer of sacrum with necrosis of muscle (HCC)    Urinary retention    Type 2 diabetes mellitus without complication, with long-term current use of insulin (HCC)    Tachycardia    Lower extremity edema    Acute metabolic encephalopathy    Obstructive sleep apnea    Morbid obesity with BMI of 60.0-69.9, adult (HCC)    Goals of care, counseling/discussion    Palliative care encounter    Sepsis (HCC) 04/27/2019   H/O insulin dependent diabetes mellitus 04/27/2019    History of CVA with residual deficit 04/27/2019   Seizure disorder (HCC) 04/27/2019   Decubitus ulcer of sacral region, stage 4 (HCC) 04/27/2019    REFERRING DIAG: Cerebral infarction, unspecified   THERAPY DIAG:  Muscle weakness (generalized)  Difficulty in walking, not elsewhere classified  History of CVA with residual deficit  Unsteadiness on feet  Abnormality of gait and mobility  Other abnormalities of gait and mobility  Other lack of coordination  Rationale for Evaluation and Treatment Rehabilitation  PERTINENT HISTORY: Paul Hall is a 25yoM who presents with severe weakness, quadriparesis, altered sensorium, and visual impairment s/p critical illness and prolonged hospitalization. Pt hospitalized in October 2020 with ARDS 2/2 COVID19 infection. Pt sustained a complex and lengthy hospitalization which included tracheostomy, prolonged sedation, ECMO. In this period pt sustained CVA and SDH. Pt has now been liberated from tracheostomy and G-tube. Pt has since been hospitalized for wound infection and UTI. Pt lives with parents at home, has hospital bed and left chair, hoyer lift transfers, and power WC for mobility needs. Pt needs heavy physical assistance with ADL 2/2 BUE contractures and motor dysfunction   PRECAUTIONS: Fall  SUBJECTIVE: Patient reports feeling much better after missing a couple of visits. States he is feeling well enough and would like to try to stand today if  able  Patient reports PAIN:  Are you having pain? No   TODAY'S TREATMENT:   There.ex:  AAROM alternating hip flexion: x12/LE   LAQ- AROM BLE   Trunk extension to anterior lean/trunk flexion performing modified crunch for rectus abdominus strengthening. Light HHA+2 for stability but pt not needing physical assist. X15   Therapeutic Activity:   [name]- Dependent transfer with PT and patient's mother assisting- power w/c to mat table (positioned in seated) - once transferred - min assist to  forward trunk lean to upright sitting position position - (then performed above therex and further theract- as below):   Standing- using sit to stand lift - (patient had his own padding utilized for lift) -  1) 1 min 30 sec  2) 1 min 45 sec (attempted to position UE on arms of lift yet patient unable to tolerate stretch on UE so removed his grip and provided forearm support)  3) 2 min - Utilized an extra sheet where PT held one end and mother holding the other end wrapped around his bottom to assist with standing more erect) 4) 2 min 10 sec with +2 assist pulling on sheet to assist with trunk/glutes  Patient then positioned back to sitting and then hoyered +2 assist from edge of mat back to power wheelchair.       PATIENT EDUCATION: Education details: posture cues with sitting Person educated: Patient Education method: Explanation, Demonstration, Tactile cues, and Verbal cues Education comprehension: verbalized understanding, returned demonstration, verbal cues required, tactile cues required, and needs further education   HOME EXERCISE PROGRAM:  Access Code: [email or web address] URL: [email or web address] Date: 03/23/2022 Prepared by: Sande Brothers  Exercises - Supine Bridge  - 3 x weekly - 3 sets - 10 reps - 2 hold - Supine Gluteal Sets  - 3 x weekly - 3 sets - 10 reps - 5 sec hold - Supine Quad Set  - 1 x daily - 3 x weekly - 3 sets - 10 reps - 5 hold - Seated Long Arc Quad  - 1 x daily - 7 x weekly - 3 sets - 10 reps - Seated Hip Adduction Squeeze with Ball  - 1 x daily - 3 x weekly - 3 sets - 10 reps - 5 hold - Seated Hip Abduction  - 1 x daily - 3 x weekly - 3 sets - 10 reps - 2 hold    PT Short Term Goals -       PT SHORT TERM GOAL #1   Title Pt will be independent with HEP in order to improve strength and balance in order to decrease fall risk and improve function at home and work.    Baseline 01/04/2021= No formal HEP in place; 12/12 no HEP in place;  05/10/2021-Patient and his father were able to report compliance with curent HEP consisting of mostly seated/reclined LE strengthening. Both verbalize no questions at this time.    Time 6    Period Weeks    Status Achieved    Target Date 02/15/21              PT Long Term Goals - Date for completion: 06/01/2022         PT LONG TERM GOAL #1   Title Patient will increase BLE gross strength by 1/2 muscle grade to improve functional strength for improved independence with potential gait, increased standing tolerance and increased ADL ability.    Baseline 01/04/2021- Patient presents with 1/5 to 3-/5 B LE strength with MMT;  12/12: goal partially met for Left knee/hip; 05/10/2021= 2-/5 bilateal Hip flex; 3+/5 bilateral Knee ext; 06/21/2021= Patient presents with 2-/5 bilateral Hip flex; 3+/5 bilateral knee ext/flex; 2-/5 left ankle DF; 0/5 right ankle- and able to increase reps and resistance with LE's. 09/15/2021- Patient technically presents with 2-/5 B hip flex/abd/add - but he is able to raise his hip up to approx 100 deg which has improved. 3+/5 Bilateral knee ext, 2-/5 left ankle and 0/5 right ankle.  12/08/2021= Patient able to lift left knee at 110 deg of hip flex; presents with 3+/5 knee ext, 2-/5 left ankle DF and 0/5 right ankle DF, 2-/5 bilateral Hip abd in seated position.   12/6: R: knee 3+/5 ext, 2/5 flexion, left knee 3+/5 extension, 3+/5 flexion, R hip: 2+/5 hip add, 2+/5 hip ABD L hip: 4-/5 hip ABD, 3+/5 hip ADD, 3+/5 hip flexion    Time 12    Period Weeks    Status Partially met, met with exception of ankle musculature on the R LE    Target Date 03/07/2022     PT LONG TERM GOAL #2   Title Patient will tolerate sitting unsupported demonstrating erect sitting posture for 15 minutes with CGA to demonstrate improved back extensor strength and improved sitting tolerance.    Baseline 01/04/2021- Patient confied to sitting in lift chair or electric power chair with back support and unable to  sit upright without physical assistance; 12/12: tolerates <1 minutes upright unsupported sitting. 05/10/2021=static sit with forward trunk lean  in his power wheelchair without back support x approx 3 min. 06/21/2021=Unable to assess today due to patient with acute back pain but on previous visit able to sit x 8 min without back support. 09/15/2021- on last visit- 09/13/2021- patient was able to sit unsupported x 8 min at edge of mat. 10/13/2023 - Patient was able to sit at edge of mat with varying level of assist today from SBA to min A for a total of 20 min. 12/13/2021= Patient demonstrated unsupported sitting at edge of mat for approx 20 min   Time 12    Period Weeks    Status GOAL MET   Target Date 12/08/21      PT LONG TERM GOAL #3   Title Patient will demonstrate ability to perform static standing in // bars > 2 min with Max Assist  without loss of balance and fair posture for improved overall strength for pre-gait and transfer activities.    Baseline 01/04/2021= Patient current uanble to stand- Dependent on hoyer or sit to stand lift for transfers. 05/10/2021=Not appropriate yet- Currently still dependent with all transfers using hoyer. 06/21/2021= Patient continuing now to focus on LE strengthening to prepare for standing-unable to try today due to acute low back pain-  planning on attempting in new cert period. 09/15/2021- Patient has attempted standing 2x in past two week- max Assist of 2 people - only once was he successful to clearing his bottom from chair - Will continue to be a focus during the new certification. 12/13/2021= Patient has been limited secondary to increased overall low back pain during this certification and will require more time to focus on this goal.  12/6: not assessed this date, will assess at date when 2-3 PTs are present for assistance    Time 12    Period Weeks    Status On-going          PT LONG TERM GOAL #4   Title Pt will improve FOTO score by  10 points or more  demonstrating improved perceived functional ability    Baseline FOTO 7 on 10/17; 03/15/21: FOTO 12; 05/10/2021 06/21/2021= 1; 09/15/2021= 9; 12/13/2021= Will issue next visit 12/6: 4   Time 12    Period Weeks    Status Not Met          PT LONG TERM GOAL #5   Title Patient will perform sit to stand transfer with appropriate AD and max assist of 2 people with 75% consistency to prepare for pregait activities.    Baseline 09/15/2021= Patient unable to stand well- unable to clear his bottom off chair with Max assist of 2 persons. 12/13/2021- Goal not appropriate to try yet but will keep and roll over to next cert as shift continues to focus on transfers/standing   Time 12    Period Weeks    Status New        PT LONG TERM GOAL #6  Title Patient will tolerate sitting unsupported demonstrating erect sitting posture for 30 minutes with CGA to demonstrate improved back extensor strength and improved sitting tolerance.   Baseline 12/13/2021= Patient demonstrated unsupported sitting at edge of mat for approx 20 min;  12/29/2021- Patient performed approx 30 min of dynamic sitting activities today.   Time 12   Period Weeks   Status ONGOING         7.  Patient will tolerate 5 minutes or more of standing in sit to stand lift in order to indicate improved lower extremity weightbearing tolerance for progression to standing in parallel bars. Baseline: 1 minute on most recent stand 02/21/22 Goal status: INITIAL       Plan     Clinical Impression Statement Treatment focused on LE strengthening and Standing per plan of care. Patient was motivated and denied any pain today with session. He struggled initially with standing - unable to extend knees and tighten glutes well- provided more support with glutes by using a sheet and +2 assist which did help engage more erect standing and provided patient more support. He did perform well overall - able to stand longer today and continues to perform well with sitting  without back support. Pt will continue to benefit from skilled physical therapy intervention to address impairments, improve QOL, and attain therapy goals.    Personal Factors and Comorbidities Comorbidity 3+;Time since onset of injury/illness/exacerbation    Comorbidities CVA, diabetes, Seizures    Examination-Activity Limitations Bathing;Bed Mobility;Bend;Caring for Others;Carry;Dressing;Hygiene/Grooming;Lift;Locomotion Level;Reach Overhead;Self Feeding;Sit;Squat;Stairs;Stand;Transfers;Toileting    Examination-Participation Restrictions Cleaning;Community Activity;Driving;Laundry;Medication Management;Meal Prep;Occupation;Personal Finances;Shop;Yard Work;Volunteer    Stability/Clinical Decision Making Evolving/Moderate complexity    Rehab Potential Fair    PT Frequency 2x / week    PT Duration 12 weeks    PT Treatment/Interventions ADLs/Self Care Home Management;Cryotherapy;Electrical Stimulation;Moist Heat;Ultrasound;DME Instruction;Gait training;Stair training;Functional mobility training;Therapeutic exercise;Balance training;Patient/family education;Orthotic Fit/Training;Neuromuscular re-education;Wheelchair mobility training;Manual techniques;Passive range of motion;Dry needling;Energy conservation;Taping;Visual/perceptual remediation/compensation;Joint Manipulations    PT Next Visit Plan core strength- sitting at EOM, postural control, sabina sit to stand if able; Continue with progressive LE Strengthening    PT Home Exercise Plan No changes to HEP today    Consulted and Agree with Plan of Care Patient;Family member/caregiver    Family Member Consulted Mom             Ollen Bowl, PT Physical Therapist- [facility]  05/10/2022, 11:19 AM

## 2022-05-10 NOTE — Therapy (Addendum)
Occupational Therapy Treatment Note  Patient Name: Paul Hall MRN: [id number] DOB:1995-07-11, 27 y.o., male Today's Date: 05/10/2022  PCP: Dr. Gerarda Fraction REFERRING PROVIDER: Dr. Gerarda Fraction   OT End of Session - 05/10/22 0849     Visit Number 17    Number of Visits 75    Date for OT Re-Evaluation 06/13/22    Authorization Type Progress report period starting 10/06/2021    OT Start Time 1645    OT Stop Time 1730    OT Time Calculation (min) 45 min    Activity Tolerance Patient tolerated treatment well    Behavior During Therapy WFL for tasks assessed/performed                     Past Medical History:  Diagnosis Date   Diabetes mellitus ([place])    Hypertension    Stroke [facility])    Past Surgical History:  Procedure Laterality Date   IVC FILTER PLACEMENT ([place] [facility])     LEG SURGERY     PEG TUBE PLACEMENT     TRACHEOSTOMY     Patient Active Problem List   Diagnosis Date Noted   Sepsis due to vancomycin resistant Enterococcus species ([place]) 06/06/2019   SIRS (systemic inflammatory response syndrome) ([place]) 06/05/2019   Acute lower UTI 06/05/2019   VRE (vancomycin-resistant Enterococci) infection 06/05/2019   Anemia 06/05/2019   Skin ulcer of sacrum with necrosis of muscle ([place])    Urinary retention    Type 2 diabetes mellitus without complication, with long-term current use of insulin ([place])    Tachycardia    Lower extremity edema    Acute metabolic encephalopathy    Obstructive sleep apnea    Morbid obesity with BMI of 60.0-69.9, adult ([place])    Goals of care, counseling/discussion    Palliative care encounter    Sepsis ([place]) 04/27/2019   H/O insulin dependent diabetes mellitus 04/27/2019   History of CVA with residual deficit 04/27/2019   Seizure disorder ([place]) 04/27/2019   Decubitus ulcer of sacral region, stage 4 ([place]) 04/27/2019    ONSET DATE: 01/2019  REFERRING DIAG: CVA/COVID-19  THERAPY DIAG:  Muscle weakness (generalized)  Rationale for Evaluation  and Treatment Rehabilitation  SUBJECTIVE:   Patient continues to look forward to a wrestling event in March.  SUBJECTIVE STATEMENT:   Pt. reports  doing well today.  Pt accompanied by: self and family member  PERTINENT HISTORY:  Pt. is a 27 y.o. male who was diagnosed with COVID-19, and CVA with resultant quadriplegia. Pt. Was then hospitalized with VRE UTI. PMHx includes: urinary retention, seizure disorder, obstructive sleep disorder, DM Type II, Morbid obesity.   PRECAUTIONS: None  WEIGHT BEARING RESTRICTIONS No  PAIN:  Are you having pain? No  FALLS: Has patient fallen in last 6 months? No  LIVING ENVIRONMENT: Lives with: lives with their family Lives in: House/apartment Stairs: No Level Entry Has following equipment at home: Wheelchair (power) and hoyer lift, sit to stand lift  PLOF: Independent  PATIENT GOALS: To be able to engage in more daily care tasks.  OBJECTIVE:   HAND DOMINANCE: Right  ADLs:  Transfers/ambulation related to ADLs: Eating: Pt. Reports being able to hold standard utensils, and is starting to engage more in self-feeding tasks, hand to mouth patterns. Pt. Reports that he does as much as he can with the task, and family assists  with the remainder of the task. Grooming: Pt. Is able to initiate holding an electric toothbrush, and brush his teeth. Family  assists LB Dressing: Total Assist UE dressing: Pt. is now able to reach up to actively assist with grasping , and pulling his gown down. Toileting:  Total Assist Bathing: MaxA UB, Total assist LB Tub Shower transfers: N/A Equipment: See above    IADLs: Shopping: Relies on family to assist Light housekeeping: Total Assist Meal Prep: Total Assist Community mobility:   Medication management:  Total Assist  Financial management: N/A Handwriting: Not legible: Pt. Is able to hold a pen with the left hand, and initiate marking the page. Pt.'s eye glasses were not available  MOBILITY STATUS:   Power w/c  POSTURE COMMENTS:  Pt. Requires position changes in his power w/c  ACTIVITY TOLERANCE: Activity tolerance:  Fair  FUNCTIONAL OUTCOME MEASURES: FOTO: 40  TR score: 45  UPPER EXTREMITY ROM     Active ROM Right eval Right 03/21/2022  Left eval Left  03/21/2022  Shoulder flexion 106 scaption 105  Scaption  119 123  Shoulder abduction 114 90  110 115  Shoulder adduction       Shoulder extension       Shoulder internal rotation       Shoulder external rotation       Elbow flexion 120(130) 145  135(135) 145  Elbow extension -45(-35) -22(-35)  -27(-18) -22(145)  Wrist flexion       Wrist extension -30(10) -25(10)  10(50 19(50)  Wrist ulnar deviation       Wrist radial deviation       Wrist pronation       Wrist supination       (Blank rows = not tested)      UPPER EXTREMITY MMT:     Right eval Right 03/21/2022  Left eval Left 03/21/2022  Shoulder flexion 3-/5 scaption 3-/5  3-/5 3/5  Shoulder abduction 3-/5 3-/5  3-/5 3-/5  Shoulder adduction       Shoulder extension       Shoulder internal rotation       Shoulder external rotation       Elbow flexion 3/5 3/5  3/5 3+/5  Elbow extension 2-/5 2/5  2-/5 2/5  Wrist flexion       Wrist extension 2-/5 2-/5  2/5 2+/5  Wrist ulnar deviation       Wrist radial deviation       Wrist pronation       Wrist supination       (Blank rows = not tested)     HAND FUNCTION:  Grip strength: Right: 0 lbs; Left: 0 lbs and Lateral pinch: Right: 5 lbs, Left: 2 lbs  03/21/2022: Lateral pinch: Right: 3.5 lbs, Left: 2 lbs  Bilateral digit PIP/DIP flexion contractures with MP hyperextension with attempts for AROM. Pt. is able to tolerate AROM to the bilateral digits at the initial evaluation however, has a history of pain in the digits.  COORDINATION:  Eval: Pt. is unable to grasp 9-hole test pegs. Pt. is able to initiate grasping larger pegs, and is able to hold a pen in the left hand.  SENSATION: Light touch:  Impaired   EDEMA:  N/A  MUSCLE TONE: BUE flexor Spasticity  COGNITIO Overall cognitive status: Continue to assess in functional context  VISION:   Subjective report: Pt. was not wearing glasses at the time of the initial eval.  Baseline vision: Vision is very limited. Wears glasses all the time Visual history: History of impaired vision following CVA. Pt. Has received [facility] low vision rehabilitation  program.   VISION ASSESSMENT: Impaired To be further assessed in functional context  PERCEPTION: Impaired   PRAXIS: Impaired: motor planning  OBSERVATIONS:  Pt reports being on Tramadol   TODAY'S TREATMENT:   There. Ex:  Pt. Worked on slow prolonged stretching of bilateral forearm supination secondary to tightness.  Pt. worked on the [employer] using a lateral grasp attachment for supination with assist proximally. Pt. Worked on reps of left forearm supination with 2# dumbbell hand weight for 2 sets 15 reps  reps total, elbow flexion, and combined elbow flexion with forearm supination. Pt. worked on left elbow extension 2 sets 15 reps.  Self-care:   Pt. worked on hand to mouth patterns holding, and drinking water from a plastic bottle. Pt. required hand over hand support for the left initially for the full bottle, with the support tapering off as the Pt. drank from the bottle. Pt.  was independently able to drink from the 1/2 to 1/4 full bottle. Discussed attempting to use a  the side of a fork to cut through items on his plate, and then repositioning the fork in his hand to prepare for eating at home this week.      PATIENT EDUCATION: Education details: There. Margorie John Person educated: Patient and Parent Education method: Explanation, Demonstration, Tactile cues, and Verbal cues Education comprehension: verbalized understanding, returned demonstration, verbal cues required, and needs further education   HOME EXERCISE PROGRAM:   Continue ongoing  assessment, and continue to provide as needed.     GOALS: Goals reviewed with patient? Yes  SHORT TERM GOALS: Target date:05/03/2022     To assess splint fit, and make appropriate adjustments to promote good skin integrity through the palmar surface of the bilateral hands.  Baseline: 03/23/2022: Pt. is wearing splints a couple of hours at night bilateral resting hand splints. 03/21/2022: Pt. is wearing splints a couple of hours at night bilateral resting hand splints. Goal status: Continue ongoing assessment as requested, and as needed   LONG TERM GOALS: Target date: 06/13/2022     FOTO score will Improve by 2 points for Pt. perceived improvement with the assessment specific ADL/IADL tasks.  Baseline: Eval: FOTO score: 40,  TR score: 45 Goal status: Ongoing  2.   Pt. will independently perform oral care for 100% of the task after complete set-up. Baseline:03/23/2022: Pt. Is able to initiate and perform oral care for approximately 75% of the task. Pt. Requires assist at proximally at the elbow for through oral care. 03/21/2022: Pt. Is able to initiate and perform oral care for approximately 75% of the task. Pt. Requires assist at proximally at the elbow for through oral care. Eval: Pt. is able to initiate using an electric toothbrush. Pt. requires assist for set-up, and assist for thoroughness, and as he Pt. fatigues. Goal status: Ongoing  3.  Pt. Will be independent with self-feeding for 100% of the meal after complete set-up Baseline: 03/23/2022: Pt. Is able to perform scooping cereal for 75% of the time. Pt. required assist, and support at the left elbow, and Pt. Presents with limited forearm supination when using the spoon, and bringing it towards his mouth. Pt. Is able to use a fork to spear items, and perform the hand to mouth pattern.  03/21/2022: Pt. Is able to perform scooping cereal for 75% of the time. Pt. required assist, and support at the left elbow, and Pt. Presents with  limited forearm supination when using the spoon, and bringing it towards his mouth. Pt.  Is able to use a fork to spear items, and perform the hand to mouth pattern.  Eval: Pt. is able to hold standard standard utensils. Pt. Performs as much of the task as he, can and has assistance for the remainder. Goal status: Ongoing  4.  Pt. Will improve grasp patterns and consistently grasp 1/2" objects for ADL, and IADL tasks.  Baseline: 03/23/2022: Pt. Is able to grasp 1" objects consistently,and continues to work on the hand patterns needed to grasp 1/2" objects.03/21/2022: Pt. Is able to grasp 1" objects consistently,and continues to work on the hand patterns needed to grasp 1/2" objects. Eval: Pt. is able to grasp 1" objects intermittently using a lateral grasp pattern. Goal status: Ongoing  5.  Pt. will independently write his name legibly with letter sizes under 2". Baseline: 03/23/2022: Pt. Is able to write his name with modA, however has difficulty with formulating letter sizes less than 2" in size with 50% legibility for the 3 letters of his name.03/21/2022: Pt. Is able to write his name with modA, however has difficulty with formulating letter sizes less than 2" in size with 50% legibility for the 3 letters of his name. Eval: Pt is able to hold a thin marker with his left hand, and formulate a line, and initiate a circular pattern (Pt. without glasses today) Goal status: Ongoing  6. Pt. Will reach up to comb/brush his hair  with minA.  Baseline: 03/23/2022: Pt. is now able to more consistently initiate reaching up to his head with his left hand in preparation for haircare12/18/2023: Pt. is now able to more consistently initiate reaching up to his head with his left hand in preparation for haircare. Eval: Pt. is able to initiate reaching up for hair care with a long handled brush, however is unable to sustain UE's in elevation to perform the task.     Goal status: Ongoing  7. Pt. Will independently  navigate the w/c through his environment with minA with visual scanning, and hand placement on the controls.  Baseline: Pt. Requires max cues for scanning through the environment, and moderate cues for hand placement on the controls.     Goal status:New       ASSESSMENT:  CLINICAL IMPRESSION:  Pt. was able to tolerate 2# hand weight for each of the exercises, and yellow theraband for elbow extension. Pt. Engaged his left hand for drinking from a water bottle. Pt. Required support initially with the full water bottle during the hand to mouth pattern with support gradually tapering off as he drank from it. Pt. was independently able to hold, and drink from the bottle  1/2-1/4 full. Pt. required rest breaks, and verbal cues for breath during the exercises. Pt. required fewer visual cues for scanning to locate the items in anticipation for reaching. Pt. required multiple rest breaks during each task. Pt. required cues, and assist for hand position with supination. Pt. continues to work on improving BUE functioning, w/c navigation, and visual compensatory strategies to improve, and maximize independence with ADLs, and IADLs.     PERFORMANCE DEFICITS in functional skills including ADLs, IADLs, coordination, dexterity, proprioception, ROM, strength, pain, [place], GMC, decreased knowledge of use of DME, and UE functional use, cognitive skills including safety awareness, and psychosocial skills including coping strategies, environmental adaptation, habits, and routines and behaviors.   IMPAIRMENTS are limiting patient from ADLs, IADLs, leisure, and social participation.   COMORBIDITIES may have co-morbidities  that affects occupational performance. Patient will benefit from skilled OT to  address above impairments and improve overall function.  MODIFICATION OR ASSISTANCE TO COMPLETE EVALUATION: Maximum or significant modification of tasks or assist is necessary to complete an evaluation.  OT OCCUPATIONAL  PROFILE AND HISTORY: Comprehensive assessment: Review of records and extensive additional review of physical, cognitive, psychosocial history related to current functional performance.  CLINICAL DECISION MAKING: High - multiple treatment options, significant modification of task necessary  REHAB POTENTIAL: Fair    EVALUATION COMPLEXITY: High    PLAN: OT FREQUENCY: 2 x's a week  OT DURATION:12 weeks  PLANNED INTERVENTIONS: self care/ADL training, therapeutic exercise, therapeutic activity, neuromuscular re-education, manual therapy, passive range of motion, patient/family education, and cognitive remediation/compensation  RECOMMENDED OTHER SERVICES: PT  CONSULTED AND AGREED WITH PLAN OF CARE: Patient and family member/caregiver  PLAN FOR NEXT SESSION: Initiate treatment   Olegario Messier, MS, OTR/L   Olegario Messier, OT 05/10/2022, 8:52 AM

## 2022-05-11 ENCOUNTER — Ambulatory Visit: Payer: BC Managed Care – PPO | Admitting: Occupational Therapy

## 2022-05-11 ENCOUNTER — Ambulatory Visit: Payer: BC Managed Care – PPO

## 2022-05-11 ENCOUNTER — Encounter: Payer: BC Managed Care – PPO | Admitting: Occupational Therapy

## 2022-05-11 DIAGNOSIS — G4733 Obstructive sleep apnea (adult) (pediatric): Secondary | ICD-10-CM | POA: Diagnosis not present

## 2022-05-15 ENCOUNTER — Other Ambulatory Visit: Payer: Self-pay | Admitting: Dermatology

## 2022-05-15 DIAGNOSIS — L732 Hidradenitis suppurativa: Secondary | ICD-10-CM

## 2022-05-16 ENCOUNTER — Encounter: Payer: Self-pay | Admitting: Occupational Therapy

## 2022-05-16 ENCOUNTER — Ambulatory Visit: Payer: BC Managed Care – PPO

## 2022-05-16 ENCOUNTER — Ambulatory Visit: Payer: BC Managed Care – PPO | Admitting: Occupational Therapy

## 2022-05-16 DIAGNOSIS — R2681 Unsteadiness on feet: Secondary | ICD-10-CM

## 2022-05-16 DIAGNOSIS — M6281 Muscle weakness (generalized): Secondary | ICD-10-CM | POA: Diagnosis not present

## 2022-05-16 DIAGNOSIS — I6930 Unspecified sequelae of cerebral infarction: Secondary | ICD-10-CM

## 2022-05-16 DIAGNOSIS — R278 Other lack of coordination: Secondary | ICD-10-CM | POA: Diagnosis not present

## 2022-05-16 DIAGNOSIS — R2689 Other abnormalities of gait and mobility: Secondary | ICD-10-CM

## 2022-05-16 DIAGNOSIS — R262 Difficulty in walking, not elsewhere classified: Secondary | ICD-10-CM | POA: Diagnosis not present

## 2022-05-16 DIAGNOSIS — R269 Unspecified abnormalities of gait and mobility: Secondary | ICD-10-CM | POA: Diagnosis not present

## 2022-05-16 NOTE — Therapy (Addendum)
Occupational Therapy Treatment Note  Patient Name: Paul Hall MRN: [id number] DOB:11-23-1995, 27 y.o., male Today's Date: 05/10/2022  PCP: Dr. Gerarda Fraction REFERRING PROVIDER: Dr. Gerarda Fraction   OT End of Session - 05/10/22 0849     Visit Number 18   Number of Visits 53    Date for OT Re-Evaluation 06/13/22    Authorization Type Progress report period starting 10/06/2021    OT Start Time 1652   OT Stop Time 1730    OT Time Calculation (min) 38 min    Activity Tolerance Patient tolerated treatment well    Behavior During Therapy WFL for tasks assessed/performed                     Past Medical History:  Diagnosis Date   Diabetes mellitus ([place])    Hypertension    Stroke [facility])    Past Surgical History:  Procedure Laterality Date   IVC FILTER PLACEMENT ([place] [facility])     LEG SURGERY     PEG TUBE PLACEMENT     TRACHEOSTOMY     Patient Active Problem List   Diagnosis Date Noted   Sepsis due to vancomycin resistant Enterococcus species ([place]) 06/06/2019   SIRS (systemic inflammatory response syndrome) ([place]) 06/05/2019   Acute lower UTI 06/05/2019   VRE (vancomycin-resistant Enterococci) infection 06/05/2019   Anemia 06/05/2019   Skin ulcer of sacrum with necrosis of muscle ([place])    Urinary retention    Type 2 diabetes mellitus without complication, with long-term current use of insulin ([place])    Tachycardia    Lower extremity edema    Acute metabolic encephalopathy    Obstructive sleep apnea    Morbid obesity with BMI of 60.0-69.9, adult ([place])    Goals of care, counseling/discussion    Palliative care encounter    Sepsis ([place]) 04/27/2019   H/O insulin dependent diabetes mellitus 04/27/2019   History of CVA with residual deficit 04/27/2019   Seizure disorder ([place]) 04/27/2019   Decubitus ulcer of sacral region, stage 4 ([place]) 04/27/2019    ONSET DATE: 01/2019  REFERRING DIAG: CVA/COVID-19  THERAPY DIAG:  Muscle weakness (generalized)  Rationale for Evaluation and  Treatment Rehabilitation  SUBJECTIVE:   Patient continues to look forward to a wrestling event in March.  SUBJECTIVE STATEMENT:   Pt. /caregiver reports the Pt.is now able to feed himself all sizes of pork rinds from a bowl.   Pt accompanied by: self and family member  PERTINENT HISTORY:  Pt. is a 27 y.o. male who was diagnosed with COVID-19, and CVA with resultant quadriplegia. Pt. Was then hospitalized with VRE UTI. PMHx includes: urinary retention, seizure disorder, obstructive sleep disorder, DM Type II, Morbid obesity.   PRECAUTIONS: None  WEIGHT BEARING RESTRICTIONS No  PAIN:  Are you having pain? No  FALLS: Has patient fallen in last 6 months? No  LIVING ENVIRONMENT: Lives with: lives with their family Lives in: House/apartment Stairs: No Level Entry Has following equipment at home: Wheelchair (power) and hoyer lift, sit to stand lift  PLOF: Independent  PATIENT GOALS: To be able to engage in more daily care tasks.  OBJECTIVE:   HAND DOMINANCE: Right  ADLs:  Transfers/ambulation related to ADLs: Eating: Pt. reports being able to hold standard utensils, and is starting to engage more in self-feeding tasks, hand to mouth patterns. Pt. Reports that he does as much as he can with the task, and family assists  with the remainder of the task. Grooming: Pt. Is able  to initiate holding an electric toothbrush, and brush his teeth. Family assists LB Dressing: Total Assist UE dressing: Pt. is now able to reach up to actively assist with grasping , and pulling his gown down. Toileting:  Total Assist Bathing: MaxA UB, Total assist LB Tub Shower transfers: N/A Equipment: See above    IADLs: Shopping: Relies on family to assist Light housekeeping: Total Assist Meal Prep: Total Assist Community mobility:   Medication management:  Total Assist  Financial management: N/A Handwriting: Not legible: Pt. Is able to hold a pen with the left hand, and initiate marking the  page. Pt.'s eye glasses were not available  MOBILITY STATUS:  Power w/c  POSTURE COMMENTS:  Pt. Requires position changes in his power w/c  ACTIVITY TOLERANCE: Activity tolerance:  Fair  FUNCTIONAL OUTCOME MEASURES: FOTO: 40  TR score: 45  UPPER EXTREMITY ROM     Active ROM Right eval Right 03/21/2022  Left eval Left  03/21/2022  Shoulder flexion 106 scaption 105  Scaption  119 123  Shoulder abduction 114 90  110 115  Shoulder adduction       Shoulder extension       Shoulder internal rotation       Shoulder external rotation       Elbow flexion 120(130) 145  135(135) 145  Elbow extension -45(-35) -22(-35)  -27(-18) -22(145)  Wrist flexion       Wrist extension -30(10) -25(10)  10(50 19(50)  Wrist ulnar deviation       Wrist radial deviation       Wrist pronation       Wrist supination       (Blank rows = not tested)      UPPER EXTREMITY MMT:     Right eval Right 03/21/2022  Left eval Left 03/21/2022  Shoulder flexion 3-/5 scaption 3-/5  3-/5 3/5  Shoulder abduction 3-/5 3-/5  3-/5 3-/5  Shoulder adduction       Shoulder extension       Shoulder internal rotation       Shoulder external rotation       Elbow flexion 3/5 3/5  3/5 3+/5  Elbow extension 2-/5 2/5  2-/5 2/5  Wrist flexion       Wrist extension 2-/5 2-/5  2/5 2+/5  Wrist ulnar deviation       Wrist radial deviation       Wrist pronation       Wrist supination       (Blank rows = not tested)     HAND FUNCTION:  Grip strength: Right: 0 lbs; Left: 0 lbs and Lateral pinch: Right: 5 lbs, Left: 2 lbs  03/21/2022: Lateral pinch: Right: 3.5 lbs, Left: 2 lbs  Bilateral digit PIP/DIP flexion contractures with MP hyperextension with attempts for AROM. Pt. is able to tolerate AROM to the bilateral digits at the initial evaluation however, has a history of pain in the digits.  COORDINATION:  Eval: Pt. is unable to grasp 9-hole test pegs. Pt. is able to initiate grasping larger pegs, and is able  to hold a pen in the left hand.  SENSATION: Light touch: Impaired   EDEMA:  N/A  MUSCLE TONE: BUE flexor Spasticity  COGNITIO Overall cognitive status: Continue to assess in functional context  VISION:   Subjective report: Pt. was not wearing glasses at the time of the initial eval.  Baseline vision: Vision is very limited. Wears glasses all the time Visual history: History of impaired vision following  CVA. Pt. Has received [facility] through the [facility] low vision rehabilitation program.   VISION ASSESSMENT: Impaired To be further assessed in functional context  PERCEPTION: Impaired   PRAXIS: Impaired: motor planning  OBSERVATIONS:  Pt reports being on Tramadol   TODAY'S TREATMENT:   There. Ex:  Pt. worked on left shoulder flexion, bilateral horizontal shoulder abduction, elbow flexion, and extension with red theraband 1-2 sets 10 reps. Pt. performed left elbow flexion/extension with 2# weight for I set 25 reps. 3# weight 1-2 sets 10 reps, forearm supination with 2# weight. Pt. worked on reps of left forearm supination with 2# dumbbell weight 1-2 sets 10 reps. Pt. worked on bilateral  forearm supination with a dowel with the left hand, and a drumstick with the right hand.  Self-care:   Pt. worked on hand to mouth patterns holding, and drinking water from a plastic bottle. Pt. required hand over hand support for the left hand initially for the full bottle, with the support tapering off as the Pt. drank from the bottle. Pt. was independently able to drink from the 1/2 to 1/4 full bottle. Pt.'s head rest limited posterior head tilt.     PATIENT EDUCATION: Education details: There. Margorie John Person educated: Patient and Parent Education method: Explanation, Demonstration, Tactile cues, and Verbal cues Education comprehension: verbalized understanding, returned demonstration, verbal cues required, and needs further education   HOME EXERCISE PROGRAM:   Continue ongoing  assessment, and continue to provide as needed.     GOALS: Goals reviewed with patient? Yes  SHORT TERM GOALS: Target date:05/03/2022     To assess splint fit, and make appropriate adjustments to promote good skin integrity through the palmar surface of the bilateral hands.  Baseline: 03/23/2022: Pt. is wearing splints a couple of hours at night bilateral resting hand splints. 03/21/2022: Pt. is wearing splints a couple of hours at night bilateral resting hand splints. Goal status: Continue ongoing assessment as requested, and as needed   LONG TERM GOALS: Target date: 06/13/2022     FOTO score will Improve by 2 points for Pt. perceived improvement with the assessment specific ADL/IADL tasks.  Baseline: Eval: FOTO score: 40,  TR score: 45 Goal status: Ongoing  2.   Pt. will independently perform oral care for 100% of the task after complete set-up. Baseline:03/23/2022: Pt. Is able to initiate and perform oral care for approximately 75% of the task. Pt. Requires assist at proximally at the elbow for through oral care. 03/21/2022: Pt. Is able to initiate and perform oral care for approximately 75% of the task. Pt. Requires assist at proximally at the elbow for through oral care. Eval: Pt. is able to initiate using an electric toothbrush. Pt. requires assist for set-up, and assist for thoroughness, and as he Pt. fatigues. Goal status: Ongoing  3.  Pt. Will be independent with self-feeding for 100% of the meal after complete set-up Baseline: 03/23/2022: Pt. Is able to perform scooping cereal for 75% of the time. Pt. required assist, and support at the left elbow, and Pt. Presents with limited forearm supination when using the spoon, and bringing it towards his mouth. Pt. Is able to use a fork to spear items, and perform the hand to mouth pattern.  03/21/2022: Pt. Is able to perform scooping cereal for 75% of the time. Pt. required assist, and support at the left elbow, and Pt. Presents with  limited forearm supination when using the spoon, and bringing it towards his mouth. Pt. Is able to use a  fork to spear items, and perform the hand to mouth pattern.  Eval: Pt. is able to hold standard standard utensils. Pt. Performs as much of the task as he, can and has assistance for the remainder. Goal status: Ongoing  4.  Pt. Will improve grasp patterns and consistently grasp 1/2" objects for ADL, and IADL tasks.  Baseline: 03/23/2022: Pt. Is able to grasp 1" objects consistently,and continues to work on the hand patterns needed to grasp 1/2" objects.03/21/2022: Pt. Is able to grasp 1" objects consistently,and continues to work on the hand patterns needed to grasp 1/2" objects. Eval: Pt. is able to grasp 1" objects intermittently using a lateral grasp pattern. Goal status: Ongoing  5.  Pt. will independently write his name legibly with letter sizes under 2". Baseline: 03/23/2022: Pt. Is able to write his name with modA, however has difficulty with formulating letter sizes less than 2" in size with 50% legibility for the 3 letters of his name.03/21/2022: Pt. Is able to write his name with modA, however has difficulty with formulating letter sizes less than 2" in size with 50% legibility for the 3 letters of his name. Eval: Pt is able to hold a thin marker with his left hand, and formulate a line, and initiate a circular pattern (Pt. without glasses today) Goal status: Ongoing  6. Pt. Will reach up to comb/brush his hair  with minA.  Baseline: 03/23/2022: Pt. is now able to more consistently initiate reaching up to his head with his left hand in preparation for haircare12/18/2023: Pt. is now able to more consistently initiate reaching up to his head with his left hand in preparation for haircare. Eval: Pt. is able to initiate reaching up for hair care with a long handled brush, however is unable to sustain UE's in elevation to perform the task.     Goal status: Ongoing  7. Pt. Will independently  navigate the w/c through his environment with minA with visual scanning, and hand placement on the controls.  Baseline: Pt. Requires max cues for scanning through the environment, and moderate cues for hand placement on the controls.     Goal status:New       ASSESSMENT:  CLINICAL IMPRESSION:  Pt. was able to tolerate 2# hand weight for each of the exercises, and progressed to 3# for left elbow flexion, and extension. Pt. Has  progressed from yellow to red theraband for all exercises this session. Pt. engaged his left hand for drinking from a water bottle. Pt. Continues to require support initially with the full water bottle during the hand to mouth pattern with support gradually tapering off as he drank from it. Pt. was independently able to hold, and drink from the bottle  1/2-1/4 full.  Pt. Continues to require assist for hand position, and support with supination. Pt. continues to work on improving BUE functioning, w/c navigation, and visual compensatory strategies to improve, and maximize independence with ADLs, and IADLs.     PERFORMANCE DEFICITS in functional skills including ADLs, IADLs, coordination, dexterity, proprioception, ROM, strength, pain, [place], [facility], decreased knowledge of use of DME, and UE functional use, cognitive skills including safety awareness, and psychosocial skills including coping strategies, environmental adaptation, habits, and routines and behaviors.   IMPAIRMENTS are limiting patient from ADLs, IADLs, leisure, and social participation.   COMORBIDITIES may have co-morbidities  that affects occupational performance. Patient will benefit from skilled OT to address above impairments and improve overall function.  MODIFICATION OR ASSISTANCE TO COMPLETE EVALUATION: Maximum or significant  modification of tasks or assist is necessary to complete an evaluation.  OT OCCUPATIONAL PROFILE AND HISTORY: Comprehensive assessment: Review of records and extensive additional  review of physical, cognitive, psychosocial history related to current functional performance.  CLINICAL DECISION MAKING: High - multiple treatment options, significant modification of task necessary  REHAB POTENTIAL: Fair    EVALUATION COMPLEXITY: High    PLAN: OT FREQUENCY: 2 x's a week  OT DURATION:12 weeks  PLANNED INTERVENTIONS: self care/ADL training, therapeutic exercise, therapeutic activity, neuromuscular re-education, manual therapy, passive range of motion, patient/family education, and cognitive remediation/compensation  RECOMMENDED OTHER SERVICES: PT  CONSULTED AND AGREED WITH PLAN OF CARE: Patient and family member/caregiver  PLAN FOR NEXT SESSION: Initiate treatment   Harrel Carina, MS, OTR/L   Harrel Carina, OT 05/10/2022, 8:52 AM

## 2022-05-16 NOTE — Therapy (Signed)
OUTPATIENT PHYSICAL THERAPY TREATMENT NOTE   Patient Name: Paul Hall MRN: [id number] DOB:[birth year], 27 y.o., male Today's Date: 05/17/2022  PCP:  Cindi Carbon PROVIDER:  Lenise Herald, PA-C   PT End of Session - 05/16/22 1557     Visit Number 86    Number of Visits 99    Date for PT Re-Evaluation 06/01/22    Authorization Type [employer] and [employer]- Need auth past 12th visit    Authorization Time Period 01/04/21-03/29/21; Recert 03/24/2021-06/16/2021; Recert 09/15/2021- 12/08/2021; Recert 12/13/2021-03/07/2022    Progress Note Due on Visit 90    PT Start Time 1600    PT Stop Time 1645    PT Time Calculation (min) 45 min    Equipment Utilized During Treatment Gait belt   Hoyer lift   Activity Tolerance Patient tolerated treatment well   queasiness/nausea   Behavior During Therapy WFL for tasks assessed/performed                       Past Medical History:  Diagnosis Date   Diabetes mellitus (HCC)    Hypertension    Stroke [facility])    Past Surgical History:  Procedure Laterality Date   IVC FILTER PLACEMENT ([facility] HX)     LEG SURGERY     PEG TUBE PLACEMENT     TRACHEOSTOMY     Patient Active Problem List   Diagnosis Date Noted   Sepsis due to vancomycin resistant Enterococcus species (HCC) 06/06/2019   SIRS (systemic inflammatory response syndrome) (HCC) 06/05/2019   Acute lower UTI 06/05/2019   VRE (vancomycin-resistant Enterococci) infection 06/05/2019   Anemia 06/05/2019   Skin ulcer of sacrum with necrosis of muscle (HCC)    Urinary retention    Type 2 diabetes mellitus without complication, with long-term current use of insulin (HCC)    Tachycardia    Lower extremity edema    Acute metabolic encephalopathy    Obstructive sleep apnea    Morbid obesity with BMI of 60.0-69.9, adult (HCC)    Goals of care, counseling/discussion    Palliative care encounter    Sepsis (HCC) 04/27/2019   H/O insulin dependent diabetes mellitus 04/27/2019    History of CVA with residual deficit 04/27/2019   Seizure disorder (HCC) 04/27/2019   Decubitus ulcer of sacral region, stage 4 (HCC) 04/27/2019    REFERRING DIAG: Cerebral infarction, unspecified   THERAPY DIAG:  Muscle weakness (generalized)  Difficulty in walking, not elsewhere classified  History of CVA with residual deficit  Unsteadiness on feet  Abnormality of gait and mobility  Other abnormalities of gait and mobility  Other lack of coordination  Rationale for Evaluation and Treatment Rehabilitation  PERTINENT HISTORY: Paul Hall is a 25yoM who presents with severe weakness, quadriparesis, altered sensorium, and visual impairment s/p critical illness and prolonged hospitalization. Pt hospitalized in October 2020 with ARDS 2/2 COVID19 infection. Pt sustained a complex and lengthy hospitalization which included tracheostomy, prolonged sedation, ECMO. In this period pt sustained CVA and SDH. Pt has now been liberated from tracheostomy and G-tube. Pt has since been hospitalized for wound infection and UTI. Pt lives with parents at home, has hospital bed and left chair, hoyer lift transfers, and power WC for mobility needs. Pt needs heavy physical assistance with ADL 2/2 BUE contractures and motor dysfunction   PRECAUTIONS: Fall  SUBJECTIVE: Patient and mother report he has been working more at home- sitting up on side of bed focusing on core/sitting posture.   Patient reports PAIN:  Are you having pain? No   TODAY'S TREATMENT:  (all performed sitting at edge of mat)  There.ex:  AROM alternating hip flexion: 2setsx12 reps each LE AROM - LAQ 2 sets of 10 reps each LE AROM- Seated hip abd- (minimal on right and more active on left) - 2 sets of 10 reps each LE AROM- Cervical Flex/ext/SB/Rotation x 15 reps each directions. AROM- Posterior shoulder roll x 15 reps BUE AAROM- Thoracic rotation- Left then right - hold 20 sec x 3 each for static stretch  AROM- Reaching each  arm down by his side attempting to bear some weight through each UE with sitting.   AROM- Trunk extension to anterior lean/trunk flexion performing modified crunch for rectus abdominus strengthening x 15 reps AROM- heel raises each LE x 15 reps  Therapeutic Activity:   Hoyer- Dependent transfer with PT and patient's mother assisting- power w/c to mat table  - once transferred - min assist to forward trunk lean to upright sitting position position -    Patient then positioned back to sitting and then hoyered +2 assist from edge of mat back to power wheelchair.       PATIENT EDUCATION: Education details: posture cues with sitting Person educated: Patient Education method: Explanation, Demonstration, Tactile cues, and Verbal cues Education comprehension: verbalized understanding, returned demonstration, verbal cues required, tactile cues required, and needs further education   HOME EXERCISE PROGRAM:  Access Code: [email or web address] URL: [email or web address] Date: 03/23/2022 Prepared by: Maureen Ralphs  Exercises - Supine Bridge  - 3 x weekly - 3 sets - 10 reps - 2 hold - Supine Gluteal Sets  - 3 x weekly - 3 sets - 10 reps - 5 sec hold - Supine Quad Set  - 1 x daily - 3 x weekly - 3 sets - 10 reps - 5 hold - Seated Long Arc Quad  - 1 x daily - 7 x weekly - 3 sets - 10 reps - Seated Hip Adduction Squeeze with Ball  - 1 x daily - 3 x weekly - 3 sets - 10 reps - 5 hold - Seated Hip Abduction  - 1 x daily - 3 x weekly - 3 sets - 10 reps - 2 hold    PT Short Term Goals -       PT SHORT TERM GOAL #1   Title Pt will be independent with HEP in order to improve strength and balance in order to decrease fall risk and improve function at home and work.    Baseline 01/04/2021= No formal HEP in place; 12/12 no HEP in place; 05/10/2021-Patient and his father were able to report compliance with curent HEP consisting of mostly seated/reclined LE strengthening. Both verbalize no  questions at this time.    Time 6    Period Weeks    Status Achieved    Target Date 02/15/21              PT Long Term Goals - Date for completion: 06/01/2022         PT LONG TERM GOAL #1   Title Patient will increase BLE gross strength by 1/2 muscle grade to improve functional strength for improved independence with potential gait, increased standing tolerance and increased ADL ability.    Baseline 01/04/2021- Patient presents with 1/5 to 3-/5 B LE strength with MMT; 12/12: goal partially met for Left knee/hip; 05/10/2021= 2-/5 bilateal Hip flex; 3+/5 bilateral Knee ext; 06/21/2021= Patient presents with 2-/5 bilateral Hip flex; 3+/5 bilateral knee ext/flex;  2-/5 left ankle DF; 0/5 right ankle- and able to increase reps and resistance with LE's. 09/15/2021- Patient technically presents with 2-/5 B hip flex/abd/add - but he is able to raise his hip up to approx 100 deg which has improved. 3+/5 Bilateral knee ext, 2-/5 left ankle and 0/5 right ankle.  12/08/2021= Patient able to lift left knee at 110 deg of hip flex; presents with 3+/5 knee ext, 2-/5 left ankle DF and 0/5 right ankle DF, 2-/5 bilateral Hip abd in seated position.   12/6: R: knee 3+/5 ext, 2/5 flexion, left knee 3+/5 extension, 3+/5 flexion, R hip: 2+/5 hip add, 2+/5 hip ABD L hip: 4-/5 hip ABD, 3+/5 hip ADD, 3+/5 hip flexion    Time 12    Period Weeks    Status Partially met, met with exception of ankle musculature on the R LE    Target Date 03/07/2022     PT LONG TERM GOAL #2   Title Patient will tolerate sitting unsupported demonstrating erect sitting posture for 15 minutes with CGA to demonstrate improved back extensor strength and improved sitting tolerance.    Baseline 01/04/2021- Patient confied to sitting in lift chair or electric power chair with back support and unable to sit upright without physical assistance; 12/12: tolerates <1 minutes upright unsupported sitting. 05/10/2021=static sit with forward trunk lean  in his  power wheelchair without back support x approx 3 min. 06/21/2021=Unable to assess today due to patient with acute back pain but on previous visit able to sit x 8 min without back support. 09/15/2021- on last visit- 09/13/2021- patient was able to sit unsupported x 8 min at edge of mat. 10/13/2023 - Patient was able to sit at edge of mat with varying level of assist today from SBA to min A for a total of 20 min. 12/13/2021= Patient demonstrated unsupported sitting at edge of mat for approx 20 min   Time 12    Period Weeks    Status GOAL MET   Target Date 12/08/21      PT LONG TERM GOAL #3   Title Patient will demonstrate ability to perform static standing in // bars > 2 min with Max Assist  without loss of balance and fair posture for improved overall strength for pre-gait and transfer activities.    Baseline 01/04/2021= Patient current uanble to stand- Dependent on hoyer or sit to stand lift for transfers. 05/10/2021=Not appropriate yet- Currently still dependent with all transfers using hoyer. 06/21/2021= Patient continuing now to focus on LE strengthening to prepare for standing-unable to try today due to acute low back pain-  planning on attempting in new cert period. 09/15/2021- Patient has attempted standing 2x in past two week- max Assist of 2 people - only once was he successful to clearing his bottom from chair - Will continue to be a focus during the new certification. 12/13/2021= Patient has been limited secondary to increased overall low back pain during this certification and will require more time to focus on this goal.  12/6: not assessed this date, will assess at date when 2-3 PTs are present for assistance    Time 12    Period Weeks    Status On-going          PT LONG TERM GOAL #4   Title Pt will improve FOTO score by 10 points or more demonstrating improved perceived functional ability    Baseline FOTO 7 on 10/17; 03/15/21: FOTO 12; 05/10/2021 06/21/2021= 1; 09/15/2021= 9; 12/13/2021= Will issue  next visit 12/6: 4   Time 12    Period Weeks    Status Not Met          PT LONG TERM GOAL #5   Title Patient will perform sit to stand transfer with appropriate AD and max assist of 2 people with 75% consistency to prepare for pregait activities.    Baseline 09/15/2021= Patient unable to stand well- unable to clear his bottom off chair with Max assist of 2 persons. 12/13/2021- Goal not appropriate to try yet but will keep and roll over to next cert as shift continues to focus on transfers/standing   Time 12    Period Weeks    Status New        PT LONG TERM GOAL #6  Title Patient will tolerate sitting unsupported demonstrating erect sitting posture for 30 minutes with CGA to demonstrate improved back extensor strength and improved sitting tolerance.   Baseline 12/13/2021= Patient demonstrated unsupported sitting at edge of mat for approx 20 min;  12/29/2021- Patient performed approx 30 min of dynamic sitting activities today.   Time 12   Period Weeks   Status ONGOING         7.  Patient will tolerate 5 minutes or more of standing in sit to stand lift in order to indicate improved lower extremity weightbearing tolerance for progression to standing in parallel bars. Baseline: 1 minute on most recent stand 02/21/22 Goal status: INITIAL       Plan     Clinical Impression Statement Treatment continues with focus on upright sitting for improved trunk strength and control and ability to perform some dynamic UE/LE activities. He demonstrated more active ability with both his neck/BUE/BLE today with improved ability to use Right LE in seated position at edge of normal mat table today.  Pt will continue to benefit from skilled physical therapy intervention to address impairments, improve QOL, and attain therapy goals.    Personal Factors and Comorbidities Comorbidity 3+;Time since onset of injury/illness/exacerbation    Comorbidities CVA, diabetes, Seizures    Examination-Activity Limitations  Bathing;Bed Mobility;Bend;Caring for Others;Carry;Dressing;Hygiene/Grooming;Lift;Locomotion Level;Reach Overhead;Self Feeding;Sit;Squat;Stairs;Stand;Transfers;Toileting    Examination-Participation Restrictions Cleaning;Community Activity;Driving;Laundry;Medication Management;Meal Prep;Occupation;Personal Finances;Shop;Yard Work;Volunteer    Stability/Clinical Decision Making Evolving/Moderate complexity    Rehab Potential Fair    PT Frequency 2x / week    PT Duration 12 weeks    PT Treatment/Interventions ADLs/Self Care Home Management;Cryotherapy;Electrical Stimulation;Moist Heat;Ultrasound;DME Instruction;Gait training;Stair training;Functional mobility training;Therapeutic exercise;Balance training;Patient/family education;Orthotic Fit/Training;Neuromuscular re-education;Wheelchair mobility training;Manual techniques;Passive range of motion;Dry needling;Energy conservation;Taping;Visual/perceptual remediation/compensation;Joint Manipulations    PT Next Visit Plan core strength- sitting at EOM, postural control, sabina sit to stand if able; Continue with progressive LE Strengthening    PT Home Exercise Plan No changes to HEP today    Consulted and Agree with Plan of Care Patient;Family member/caregiver    Family Member Consulted Mom             Louis Meckel, PT Physical Therapist- [facility]  [facility]  05/17/2022, 11:41 AM

## 2022-05-18 ENCOUNTER — Ambulatory Visit: Payer: BC Managed Care – PPO

## 2022-05-18 ENCOUNTER — Ambulatory Visit: Payer: BC Managed Care – PPO | Admitting: Occupational Therapy

## 2022-05-18 ENCOUNTER — Encounter: Payer: BC Managed Care – PPO | Admitting: Occupational Therapy

## 2022-05-18 DIAGNOSIS — R262 Difficulty in walking, not elsewhere classified: Secondary | ICD-10-CM

## 2022-05-18 DIAGNOSIS — R2681 Unsteadiness on feet: Secondary | ICD-10-CM

## 2022-05-18 DIAGNOSIS — R269 Unspecified abnormalities of gait and mobility: Secondary | ICD-10-CM

## 2022-05-18 DIAGNOSIS — M6281 Muscle weakness (generalized): Secondary | ICD-10-CM

## 2022-05-18 DIAGNOSIS — I6930 Unspecified sequelae of cerebral infarction: Secondary | ICD-10-CM | POA: Diagnosis not present

## 2022-05-18 DIAGNOSIS — R2689 Other abnormalities of gait and mobility: Secondary | ICD-10-CM

## 2022-05-18 DIAGNOSIS — R278 Other lack of coordination: Secondary | ICD-10-CM

## 2022-05-18 NOTE — Therapy (Signed)
OUTPATIENT PHYSICAL THERAPY TREATMENT NOTE   Patient Name: Paul Hall MRN: [id number] DOB:Sep 30, 1995, 27 y.o., male Today's Date: 05/17/2022  PCP:  Paul Hall PROVIDER:  Collene Mares, PA-C   PT End of Session - 05/16/22 1557     Visit Number 63    Number of Visits 4    Date for PT Re-Evaluation 06/01/22    Authorization Type [employer] and [employer]- Need auth past 12th visit    Authorization Time Period 01/04/21-03/29/21; Recert [id number]; Recert [id number]; Recert [id number]    Progress Note Due on Visit 90    PT Start Time 1600    PT Stop Time 1645    PT Time Calculation (min) 45 min    Equipment Utilized During Treatment Gait belt   Hoyer lift   Activity Tolerance Patient tolerated treatment well   queasiness/nausea   Behavior During Therapy WFL for tasks assessed/performed                       Past Medical History:  Diagnosis Date   Diabetes mellitus ([place])    Hypertension    Stroke [facility])    Past Surgical History:  Procedure Laterality Date   IVC FILTER PLACEMENT ([place] [facility])     LEG SURGERY     PEG TUBE PLACEMENT     TRACHEOSTOMY     Patient Active Problem List   Diagnosis Date Noted   Sepsis due to vancomycin resistant Enterococcus species ([place]) 06/06/2019   SIRS (systemic inflammatory response syndrome) ([place]) 06/05/2019   Acute lower UTI 06/05/2019   VRE (vancomycin-resistant Enterococci) infection 06/05/2019   Anemia 06/05/2019   Skin ulcer of sacrum with necrosis of muscle ([place])    Urinary retention    Type 2 diabetes mellitus without complication, with long-term current use of insulin ([facility])    Tachycardia    Lower extremity edema    Acute metabolic encephalopathy    Obstructive sleep apnea    Morbid obesity with BMI of 60.0-69.9, adult ([place])    Goals of care, counseling/discussion    Palliative care encounter    Sepsis ([place]) 04/27/2019   H/O insulin dependent diabetes mellitus 04/27/2019    History of CVA with residual deficit 04/27/2019   Seizure disorder ([place]) 04/27/2019   Decubitus ulcer of sacral region, stage 4 ([place]) 04/27/2019    REFERRING DIAG: Cerebral infarction, unspecified   THERAPY DIAG:  Muscle weakness (generalized)  Difficulty in walking, not elsewhere classified  History of CVA with residual deficit  Unsteadiness on feet  Abnormality of gait and mobility  Other abnormalities of gait and mobility  Other lack of coordination  Rationale for Evaluation and Treatment Rehabilitation  PERTINENT HISTORY: Paul Hall is a 25yoM who presents with severe weakness, quadriparesis, altered sensorium, and visual impairment s/p critical illness and prolonged hospitalization. Pt hospitalized in October 2020 with ARDS 2/2 COVID19 infection. Pt sustained a complex and lengthy hospitalization which included tracheostomy, prolonged sedation, ECMO. In this period pt sustained CVA and SDH. Pt has now been liberated from tracheostomy and G-tube. Pt has since been hospitalized for wound infection and UTI. Pt lives with parents at home, has hospital bed and left chair, hoyer lift transfers, and power WC for mobility needs. Pt needs heavy physical assistance with ADL 2/2 BUE contractures and motor dysfunction   PRECAUTIONS: Fall  SUBJECTIVE: Patient reports today has not been his best day. States physically okay and agreeable to session.     Patient reports PAIN:  Are you  having pain? No   TODAY'S TREATMENT:    Therapeutic Activity:   [name]- Dependent transfer with PT and patient's mother assisting- power w/c to mat table  - once transferred - min assist to forward trunk lean to upright sitting position position -   Patient performed 5 time- Sit to stand using sit to stand lift  -Times ranged from 45 sec to just over 2 min- focusing on standing as erect as possible incorporating more forearm support and some gluteal support (use of sheet) held by patient mother and PT  to pull his bottom more anteriorly. He was able to stand on 3rd attempt with left knee slightly outside of support and states more shaky but PT did not realize his Left LE was not well supported and he was actually placing the weight through his Left LE some without lift support.    Patient then positioned back to sitting and then hoyered +2 assist from edge of mat back to power wheelchair.            PATIENT EDUCATION: Education details: posture cues with sitting Person educated: Patient Education method: Explanation, Demonstration, Tactile cues, and Verbal cues Education comprehension: verbalized understanding, returned demonstration, verbal cues required, tactile cues required, and needs further education   HOME EXERCISE PROGRAM:  Access Code: [email or web address] URL: [email or web address] Date: 03/23/2022 Prepared by: Paul Hall  Exercises - Supine Bridge  - 3 x weekly - 3 sets - 10 reps - 2 hold - Supine Gluteal Sets  - 3 x weekly - 3 sets - 10 reps - 5 sec hold - Supine Quad Set  - 1 x daily - 3 x weekly - 3 sets - 10 reps - 5 hold - Seated Long Arc Quad  - 1 x daily - 7 x weekly - 3 sets - 10 reps - Seated Hip Adduction Squeeze with Ball  - 1 x daily - 3 x weekly - 3 sets - 10 reps - 5 hold - Seated Hip Abduction  - 1 x daily - 3 x weekly - 3 sets - 10 reps - 2 hold    PT Short Term Goals -       PT SHORT TERM GOAL #1   Title Pt will be independent with HEP in order to improve strength and balance in order to decrease fall risk and improve function at home and work.    Baseline 01/04/2021= No formal HEP in place; 12/12 no HEP in place; 05/10/2021-Patient and his father were able to report compliance with curent HEP consisting of mostly seated/reclined LE strengthening. Both verbalize no questions at this time.    Time 6    Period Weeks    Status Achieved    Target Date 02/15/21              PT Long Term Goals - Date for completion: 06/01/2022          PT LONG TERM GOAL #1   Title Patient will increase BLE gross strength by 1/2 muscle grade to improve functional strength for improved independence with potential gait, increased standing tolerance and increased ADL ability.    Baseline 01/04/2021- Patient presents with 1/5 to 3-/5 B LE strength with MMT; 12/12: goal partially met for Left knee/hip; 05/10/2021= 2-/5 bilateal Hip flex; 3+/5 bilateral Knee ext; 06/21/2021= Patient presents with 2-/5 bilateral Hip flex; 3+/5 bilateral knee ext/flex; 2-/5 left ankle DF; 0/5 right ankle- and able to increase reps and resistance with LE's. 09/15/2021- Patient technically presents with  2-/5 B hip flex/abd/add - but he is able to raise his hip up to approx 100 deg which has improved. 3+/5 Bilateral knee ext, 2-/5 left ankle and 0/5 right ankle.  12/08/2021= Patient able to lift left knee at 110 deg of hip flex; presents with 3+/5 knee ext, 2-/5 left ankle DF and 0/5 right ankle DF, 2-/5 bilateral Hip abd in seated position.   12/6: R: knee 3+/5 ext, 2/5 flexion, left knee 3+/5 extension, 3+/5 flexion, R hip: 2+/5 hip add, 2+/5 hip ABD L hip: 4-/5 hip ABD, 3+/5 hip ADD, 3+/5 hip flexion    Time 12    Period Weeks    Status Partially met, met with exception of ankle musculature on the R LE    Target Date 03/07/2022     PT LONG TERM GOAL #2   Title Patient will tolerate sitting unsupported demonstrating erect sitting posture for 15 minutes with CGA to demonstrate improved back extensor strength and improved sitting tolerance.    Baseline 01/04/2021- Patient confied to sitting in lift chair or electric power chair with back support and unable to sit upright without physical assistance; 12/12: tolerates <1 minutes upright unsupported sitting. 05/10/2021=static sit with forward trunk lean  in his power wheelchair without back support x approx 3 min. 06/21/2021=Unable to assess today due to patient with acute back pain but on previous visit able to sit x 8 min without back  support. 09/15/2021- on last visit- 09/13/2021- patient was able to sit unsupported x 8 min at edge of mat. 10/13/2023 - Patient was able to sit at edge of mat with varying level of assist today from SBA to min A for a total of 20 min. 12/13/2021= Patient demonstrated unsupported sitting at edge of mat for approx 20 min   Time 12    Period Weeks    Status GOAL MET   Target Date 12/08/21      PT LONG TERM GOAL #3   Title Patient will demonstrate ability to perform static standing in // bars > 2 min with Max Assist  without loss of balance and fair posture for improved overall strength for pre-gait and transfer activities.    Baseline 01/04/2021= Patient current uanble to stand- Dependent on hoyer or sit to stand lift for transfers. 05/10/2021=Not appropriate yet- Currently still dependent with all transfers using hoyer. 06/21/2021= Patient continuing now to focus on LE strengthening to prepare for standing-unable to try today due to acute low back pain-  planning on attempting in new cert period. 09/15/2021- Patient has attempted standing 2x in past two week- max Assist of 2 people - only once was he successful to clearing his bottom from chair - Will continue to be a focus during the new certification. 12/13/2021= Patient has been limited secondary to increased overall low back pain during this certification and will require more time to focus on this goal.  12/6: not assessed this date, will assess at date when 2-3 PTs are present for assistance    Time 12    Period Weeks    Status On-going          PT LONG TERM GOAL #4   Title Pt will improve FOTO score by 10 points or more demonstrating improved perceived functional ability    Baseline FOTO 7 on 10/17; 03/15/21: FOTO 12; 05/10/2021 06/21/2021= 1; 09/15/2021= 9; 12/13/2021= Will issue next visit 12/6: 4   Time 12    Period Weeks    Status Not Met  PT LONG TERM GOAL #5   Title Patient will perform sit to stand transfer with appropriate AD and max  assist of 2 people with 75% consistency to prepare for pregait activities.    Baseline 09/15/2021= Patient unable to stand well- unable to clear his bottom off chair with Max assist of 2 persons. 12/13/2021- Goal not appropriate to try yet but will keep and roll over to next cert as shift continues to focus on transfers/standing   Time 12    Period Weeks    Status New        PT LONG TERM GOAL #6  Title Patient will tolerate sitting unsupported demonstrating erect sitting posture for 30 minutes with CGA to demonstrate improved back extensor strength and improved sitting tolerance.   Baseline 12/13/2021= Patient demonstrated unsupported sitting at edge of mat for approx 20 min;  12/29/2021- Patient performed approx 30 min of dynamic sitting activities today.   Time 12   Period Weeks   Status ONGOING         7.  Patient will tolerate 5 minutes or more of standing in sit to stand lift in order to indicate improved lower extremity weightbearing tolerance for progression to standing in parallel bars. Baseline: 1 minute on most recent stand 02/21/22 Goal status: INITIAL       Plan     Clinical Impression Statement Patient presented today more quiet overall yet physically did well- Able to stand several times today and good motivation yet remains very weak and limited. He was able to activate more glutes with some assist.  Pt will continue to benefit from skilled physical therapy intervention to address impairments, improve QOL, and attain therapy goals.    Personal Factors and Comorbidities Comorbidity 3+;Time since onset of injury/illness/exacerbation    Comorbidities CVA, diabetes, Seizures    Examination-Activity Limitations Bathing;Bed Mobility;Bend;Caring for Others;Carry;Dressing;Hygiene/Grooming;Lift;Locomotion Level;Reach Overhead;Self Feeding;Sit;Squat;Stairs;Stand;Transfers;Toileting    Examination-Participation Restrictions Cleaning;Community Activity;Driving;Laundry;Medication  Management;Meal Prep;Occupation;Personal Finances;Shop;Yard Work;Volunteer    Stability/Clinical Decision Making Evolving/Moderate complexity    Rehab Potential Fair    PT Frequency 2x / week    PT Duration 12 weeks    PT Treatment/Interventions ADLs/Self Care Home Management;Cryotherapy;Electrical Stimulation;Moist Heat;Ultrasound;DME Instruction;Gait training;Stair training;Functional mobility training;Therapeutic exercise;Balance training;Patient/family education;Orthotic Fit/Training;Neuromuscular re-education;Wheelchair mobility training;Manual techniques;Passive range of motion;Dry needling;Energy conservation;Taping;Visual/perceptual remediation/compensation;Joint Manipulations    PT Next Visit Plan core strength- sitting at EOM, postural control, sabina sit to stand if able; Continue with progressive LE Strengthening    PT Home Exercise Plan No changes to HEP today    Consulted and Agree with Plan of Care Patient;Family member/caregiver    Family Member Consulted Mom             Ollen Bowl, PT Physical Therapist- [facility]  05/17/2022, 11:41 AM

## 2022-05-19 NOTE — Therapy (Signed)
Occupational Therapy Treatment Note  Patient Name: Paul Hall MRN: [id number] DOB:Jun 03, 1995, 27 y.o., male Today's Date: 05/19/2022  PCP: Dr. Gerarda Fraction REFERRING PROVIDER: Dr. Gerarda Fraction   OT End of Session - 05/19/22 0847     Visit Number 19    Number of Visits 87    Date for OT Re-Evaluation 06/13/22    Authorization Type Progress report period starting 10/06/2021    OT Start Time 1651    OT Stop Time 1730    OT Time Calculation (min) 39 min    Activity Tolerance Patient tolerated treatment well    Behavior During Therapy WFL for tasks assessed/performed                     Past Medical History:  Diagnosis Date   Diabetes mellitus ([place])    Hypertension    Stroke [facility])    Past Surgical History:  Procedure Laterality Date   IVC FILTER PLACEMENT ([place] [facility])     LEG SURGERY     PEG TUBE PLACEMENT     TRACHEOSTOMY     Patient Active Problem List   Diagnosis Date Noted   Sepsis due to vancomycin resistant Enterococcus species ([place]) 06/06/2019   SIRS (systemic inflammatory response syndrome) ([place]) 06/05/2019   Acute lower UTI 06/05/2019   VRE (vancomycin-resistant Enterococci) infection 06/05/2019   Anemia 06/05/2019   Skin ulcer of sacrum with necrosis of muscle ([place])    Urinary retention    Type 2 diabetes mellitus without complication, with long-term current use of insulin ([facility])    Tachycardia    Lower extremity edema    Acute metabolic encephalopathy    Obstructive sleep apnea    Morbid obesity with BMI of 60.0-69.9, adult ([place])    Goals of care, counseling/discussion    Palliative care encounter    Sepsis ([place]) 04/27/2019   H/O insulin dependent diabetes mellitus 04/27/2019   History of CVA with residual deficit 04/27/2019   Seizure disorder ([place]) 04/27/2019   Decubitus ulcer of sacral region, stage 4 ([place]) 04/27/2019    ONSET DATE: 01/2019  REFERRING DIAG: CVA/COVID-19  THERAPY DIAG:  Muscle weakness (generalized)  Rationale for Evaluation  and Treatment Rehabilitation  SUBJECTIVE:   SUBJECTIVE STATEMENT:   Upon arrival to therapy today, Pt./caregiver report Pt. was not having a great day today.   Pt accompanied by: self and family member  PERTINENT HISTORY:  Pt. is a 27 y.o. male who was diagnosed with COVID-19, and CVA with resultant quadriplegia. Pt. Was then hospitalized with VRE UTI. PMHx includes: urinary retention, seizure disorder, obstructive sleep disorder, DM Type II, Morbid obesity.   PRECAUTIONS: None  WEIGHT BEARING RESTRICTIONS No  PAIN:  Are you having pain? No  FALLS: Has patient fallen in last 6 months? No  LIVING ENVIRONMENT: Lives with: lives with their family Lives in: House/apartment Stairs: No Level Entry Has following equipment at home: Wheelchair (power) and hoyer lift, sit to stand lift  PLOF: Independent  PATIENT GOALS: To be able to engage in more daily care tasks.  OBJECTIVE:   HAND DOMINANCE: Right  ADLs:  Transfers/ambulation related to ADLs: Eating: Pt. reports being able to hold standard utensils, and is starting to engage more in self-feeding tasks, hand to mouth patterns. Pt. Reports that he does as much as he can with the task, and family assists  with the remainder of the task. Grooming: Pt. Is able to initiate holding an electric toothbrush, and brush his teeth. Family assists LB  Dressing: Total Assist UE dressing: Pt. is now able to reach up to actively assist with grasping , and pulling his gown down. Toileting:  Total Assist Bathing: MaxA UB, Total assist LB Tub Shower transfers: N/A Equipment: See above    IADLs: Shopping: Relies on family to assist Light housekeeping: Total Assist Meal Prep: Total Assist Community mobility:   Medication management:  Total Assist  Financial management: N/A Handwriting: Not legible: Pt. Is able to hold a pen with the left hand, and initiate marking the page. Pt.'s eye glasses were not available  MOBILITY STATUS:  Power  w/c  POSTURE COMMENTS:  Pt. Requires position changes in his power w/c  ACTIVITY TOLERANCE: Activity tolerance:  Fair  FUNCTIONAL OUTCOME MEASURES: FOTO: 40  TR score: 45  UPPER EXTREMITY ROM     Active ROM Right eval Right 03/21/2022  Left eval Left  03/21/2022  Shoulder flexion 106 scaption 105  Scaption  119 123  Shoulder abduction 114 90  110 115  Shoulder adduction       Shoulder extension       Shoulder internal rotation       Shoulder external rotation       Elbow flexion 120(130) 145  135(135) 145  Elbow extension -45(-35) -22(-35)  -27(-18) -22(145)  Wrist flexion       Wrist extension -30(10) -25(10)  10(50 19(50)  Wrist ulnar deviation       Wrist radial deviation       Wrist pronation       Wrist supination       (Blank rows = not tested)      UPPER EXTREMITY MMT:     Right eval Right 03/21/2022  Left eval Left 03/21/2022  Shoulder flexion 3-/5 scaption 3-/5  3-/5 3/5  Shoulder abduction 3-/5 3-/5  3-/5 3-/5  Shoulder adduction       Shoulder extension       Shoulder internal rotation       Shoulder external rotation       Elbow flexion 3/5 3/5  3/5 3+/5  Elbow extension 2-/5 2/5  2-/5 2/5  Wrist flexion       Wrist extension 2-/5 2-/5  2/5 2+/5  Wrist ulnar deviation       Wrist radial deviation       Wrist pronation       Wrist supination       (Blank rows = not tested)     HAND FUNCTION:  Grip strength: Right: 0 lbs; Left: 0 lbs and Lateral pinch: Right: 5 lbs, Left: 2 lbs  03/21/2022: Lateral pinch: Right: 3.5 lbs, Left: 2 lbs  Bilateral digit PIP/DIP flexion contractures with MP hyperextension with attempts for AROM. Pt. is able to tolerate AROM to the bilateral digits at the initial evaluation however, has a history of pain in the digits.  COORDINATION:  Eval: Pt. is unable to grasp 9-hole test pegs. Pt. is able to initiate grasping larger pegs, and is able to hold a pen in the left hand.  SENSATION: Light touch: Impaired    EDEMA:  N/A  MUSCLE TONE: BUE flexor Spasticity  COGNITIO Overall cognitive status: Continue to assess in functional context  VISION:   Subjective report: Pt. was not wearing glasses at the time of the initial eval.  Baseline vision: Vision is very limited. Wears glasses all the time Visual history: History of impaired vision following CVA. Pt. Has received [facility] the [facility] vision rehabilitation program.  VISION ASSESSMENT: Impaired To be further assessed in functional context  PERCEPTION: Impaired   PRAXIS: Impaired: motor planning  OBSERVATIONS:  Pt reports being on Tramadol   TODAY'S TREATMENT:   There. Ex:  Pt. worked on left shoulder flexion, bilateral horizontal shoulder abduction, elbow flexion, and extension with red theraband 1-2 sets 10 reps. Pt. performed left elbow flexion, extension, forearm supination 3# weight 1-2 sets 10 reps. Pt. tolerated prolonged gentle stretching with right forearm supination 2/2 increased tone, and tightness. Pt. performed reps of AROM for right shoulder horizontal adduction, as well as horizontal abduction with resistance. Pt. Worked on isolated, and gross left hand digit flexion using the DigiFlex.  Self-care:   Pt. worked on hand to mouth patterns holding, and drinking water from a plastic bottle. Pt. required hand over hand support for the left hand initially for the full bottle, with the support tapering off. Pt./caregiver report the Pt. Was able to independently perform the hand to mouth pattern at home, and drink from a full bottle once his hand ws set into position on the bottle.    PATIENT EDUCATION: Education details: There. Margorie John Person educated: Patient and Parent Education method: Explanation, Demonstration, Tactile cues, and Verbal cues Education comprehension: verbalized understanding, returned demonstration, verbal cues required, and needs further education   HOME EXERCISE PROGRAM:   Continue  ongoing assessment, and continue to provide as needed.     GOALS: Goals reviewed with patient? Yes  SHORT TERM GOALS: Target date:05/03/2022     To assess splint fit, and make appropriate adjustments to promote good skin integrity through the palmar surface of the bilateral hands.  Baseline: 03/23/2022: Pt. is wearing splints a couple of hours at night bilateral resting hand splints. 03/21/2022: Pt. is wearing splints a couple of hours at night bilateral resting hand splints. Goal status: Continue ongoing assessment as requested, and as needed   LONG TERM GOALS: Target date: 06/13/2022     FOTO score will Improve by 2 points for Pt. perceived improvement with the assessment specific ADL/IADL tasks.  Baseline: Eval: FOTO score: 40,  TR score: 45 Goal status: Ongoing  2.   Pt. will independently perform oral care for 100% of the task after complete set-up. Baseline:03/23/2022: Pt. Is able to initiate and perform oral care for approximately 75% of the task. Pt. Requires assist at proximally at the elbow for through oral care. 03/21/2022: Pt. Is able to initiate and perform oral care for approximately 75% of the task. Pt. Requires assist at proximally at the elbow for through oral care. Eval: Pt. is able to initiate using an electric toothbrush. Pt. requires assist for set-up, and assist for thoroughness, and as he Pt. fatigues. Goal status: Ongoing  3.  Pt. Will be independent with self-feeding for 100% of the meal after complete set-up Baseline: 03/23/2022: Pt. Is able to perform scooping cereal for 75% of the time. Pt. required assist, and support at the left elbow, and Pt. Presents with limited forearm supination when using the spoon, and bringing it towards his mouth. Pt. Is able to use a fork to spear items, and perform the hand to mouth pattern.  03/21/2022: Pt. Is able to perform scooping cereal for 75% of the time. Pt. required assist, and support at the left elbow, and Pt. Presents  with limited forearm supination when using the spoon, and bringing it towards his mouth. Pt. Is able to use a fork to spear items, and perform the hand to mouth pattern.  Eval:  Pt. is able to hold standard standard utensils. Pt. Performs as much of the task as he, can and has assistance for the remainder. Goal status: Ongoing  4.  Pt. Will improve grasp patterns and consistently grasp 1/2" objects for ADL, and IADL tasks.  Baseline: 03/23/2022: Pt. Is able to grasp 1" objects consistently,and continues to work on the hand patterns needed to grasp 1/2" objects.03/21/2022: Pt. Is able to grasp 1" objects consistently,and continues to work on the hand patterns needed to grasp 1/2" objects. Eval: Pt. is able to grasp 1" objects intermittently using a lateral grasp pattern. Goal status: Ongoing  5.  Pt. will independently write his name legibly with letter sizes under 2". Baseline: 03/23/2022: Pt. Is able to write his name with modA, however has difficulty with formulating letter sizes less than 2" in size with 50% legibility for the 3 letters of his name.03/21/2022: Pt. Is able to write his name with modA, however has difficulty with formulating letter sizes less than 2" in size with 50% legibility for the 3 letters of his name. Eval: Pt is able to hold a thin marker with his left hand, and formulate a line, and initiate a circular pattern (Pt. without glasses today) Goal status: Ongoing  6. Pt. Will reach up to comb/brush his hair  with minA.  Baseline: 03/23/2022: Pt. is now able to more consistently initiate reaching up to his head with his left hand in preparation for haircare12/18/2023: Pt. is now able to more consistently initiate reaching up to his head with his left hand in preparation for haircare. Eval: Pt. is able to initiate reaching up for hair care with a long handled brush, however is unable to sustain UE's in elevation to perform the task.     Goal status: Ongoing  7. Pt. Will  independently navigate the w/c through his environment with minA with visual scanning, and hand placement on the controls.  Baseline: Pt. Requires max cues for scanning through the environment, and moderate cues for hand placement on the controls.     Goal status:New       ASSESSMENT:  CLINICAL IMPRESSION:  Pt. was able to tolerate the LUE strengthening with the 3# hand weight, and red theraband. Pt. has progressed from yellow to red theraband for all exercises this session. Pt. engaged his left hand for drinking from a water bottle. Pt. continues to require support initially with the full water bottle during the hand to mouth pattern with support gradually tapering off as he drank from it. Pt. was independently able to hold, and drink from the bottle 1/2 full. Pt. continues to require assist for hand position, and support with supination. Pt. continues to work on improving BUE functioning, w/c navigation, and visual compensatory strategies to improve, and maximize independence with ADLs, and IADLs.     PERFORMANCE DEFICITS in functional skills including ADLs, IADLs, coordination, dexterity, proprioception, ROM, strength, pain, [place], [facility], decreased knowledge of use of DME, and UE functional use, cognitive skills including safety awareness, and psychosocial skills including coping strategies, environmental adaptation, habits, and routines and behaviors.   IMPAIRMENTS are limiting patient from ADLs, IADLs, leisure, and social participation.   COMORBIDITIES may have co-morbidities  that affects occupational performance. Patient will benefit from skilled OT to address above impairments and improve overall function.  MODIFICATION OR ASSISTANCE TO COMPLETE EVALUATION: Maximum or significant modification of tasks or assist is necessary to complete an evaluation.  OT OCCUPATIONAL PROFILE AND HISTORY: Comprehensive assessment: Review of records and  extensive additional review of physical, cognitive,  psychosocial history related to current functional performance.  CLINICAL DECISION MAKING: High - multiple treatment options, significant modification of task necessary  REHAB POTENTIAL: Fair    EVALUATION COMPLEXITY: High    PLAN: OT FREQUENCY: 2 x's a week  OT DURATION:12 weeks  PLANNED INTERVENTIONS: self care/ADL training, therapeutic exercise, therapeutic activity, neuromuscular re-education, manual therapy, passive range of motion, patient/family education, and cognitive remediation/compensation  RECOMMENDED OTHER SERVICES: PT  CONSULTED AND AGREED WITH PLAN OF CARE: Patient and family member/caregiver  PLAN FOR NEXT SESSION: Initiate treatment   Harrel Carina, MS, OTR/L   Harrel Carina, OT 05/19/2022, 8:51 AM

## 2022-05-23 ENCOUNTER — Ambulatory Visit: Payer: BC Managed Care – PPO

## 2022-05-23 ENCOUNTER — Ambulatory Visit: Payer: BC Managed Care – PPO | Admitting: Occupational Therapy

## 2022-05-24 DIAGNOSIS — L8994 Pressure ulcer of unspecified site, stage 4: Secondary | ICD-10-CM | POA: Diagnosis not present

## 2022-05-24 DIAGNOSIS — I6389 Other cerebral infarction: Secondary | ICD-10-CM | POA: Diagnosis not present

## 2022-05-24 DIAGNOSIS — R32 Unspecified urinary incontinence: Secondary | ICD-10-CM | POA: Diagnosis not present

## 2022-05-25 ENCOUNTER — Ambulatory Visit: Payer: BC Managed Care – PPO | Admitting: Occupational Therapy

## 2022-05-25 ENCOUNTER — Encounter: Payer: BC Managed Care – PPO | Admitting: Occupational Therapy

## 2022-05-25 ENCOUNTER — Ambulatory Visit: Payer: BC Managed Care – PPO

## 2022-05-25 DIAGNOSIS — R278 Other lack of coordination: Secondary | ICD-10-CM | POA: Diagnosis not present

## 2022-05-25 DIAGNOSIS — R2681 Unsteadiness on feet: Secondary | ICD-10-CM | POA: Diagnosis not present

## 2022-05-25 DIAGNOSIS — M6281 Muscle weakness (generalized): Secondary | ICD-10-CM

## 2022-05-25 DIAGNOSIS — R2689 Other abnormalities of gait and mobility: Secondary | ICD-10-CM | POA: Diagnosis not present

## 2022-05-25 DIAGNOSIS — I6930 Unspecified sequelae of cerebral infarction: Secondary | ICD-10-CM | POA: Diagnosis not present

## 2022-05-25 DIAGNOSIS — R262 Difficulty in walking, not elsewhere classified: Secondary | ICD-10-CM | POA: Diagnosis not present

## 2022-05-25 DIAGNOSIS — R269 Unspecified abnormalities of gait and mobility: Secondary | ICD-10-CM | POA: Diagnosis not present

## 2022-05-25 NOTE — Therapy (Addendum)
Occupational Therapy Progress Note  Dates of reporting period  03/23/2022   to   05/25/2022   Patient Name: Paul Hall MRN: [id number] DOB:[birth year], 27 y.o., male Today's Date: 05/25/2022  PCP: Dr. Gerarda Fraction REFERRING PROVIDER: Dr. Gerarda Fraction   OT End of Session - 05/25/22 2157     Visit Number 20    Number of Visits 36    Date for OT Re-Evaluation 08/17/22    OT Start Time [id number]    OT Stop Time 1733    OT Time Calculation (min) 43 min    Equipment Utilized During Treatment tilt in space power wc    Activity Tolerance Patient tolerated treatment well    Behavior During Therapy WFL for tasks assessed/performed                     Past Medical History:  Diagnosis Date   Diabetes mellitus ([place])    Hypertension    Stroke [facility])    Past Surgical History:  Procedure Laterality Date   IVC FILTER PLACEMENT ([place] [facility])     LEG SURGERY     PEG TUBE PLACEMENT     TRACHEOSTOMY     Patient Active Problem List   Diagnosis Date Noted   Sepsis due to vancomycin resistant Enterococcus species ([place]) 06/06/2019   SIRS (systemic inflammatory response syndrome) ([place]) 06/05/2019   Acute lower UTI 06/05/2019   VRE (vancomycin-resistant Enterococci) infection 06/05/2019   Anemia 06/05/2019   Skin ulcer of sacrum with necrosis of muscle ([place])    Urinary retention    Type 2 diabetes mellitus without complication, with long-term current use of insulin ([facility])    Tachycardia    Lower extremity edema    Acute metabolic encephalopathy    Obstructive sleep apnea    Morbid obesity with BMI of 60.0-69.9, adult ([place])    Goals of care, counseling/discussion    Palliative care encounter    Sepsis ([place]) 04/27/2019   H/O insulin dependent diabetes mellitus 04/27/2019   History of CVA with residual deficit 04/27/2019   Seizure disorder ([place]) 04/27/2019   Decubitus ulcer of sacral region, stage 4 ([place]) 04/27/2019    ONSET DATE: 01/2019  REFERRING DIAG: CVA/COVID-19  THERAPY DIAG:   Muscle weakness (generalized)  Other lack of coordination  Rationale for Evaluation and Treatment Rehabilitation  SUBJECTIVE:   SUBJECTIVE STATEMENT:    Pt. reports feeling much better today after having had a bad headache Sunday night into Monday.  Pt accompanied by: self and family member  PERTINENT HISTORY:  Pt. is a 27 y.o. male who was diagnosed with COVID-19, and CVA with resultant quadriplegia. Pt. Was then hospitalized with VRE UTI. PMHx includes: urinary retention, seizure disorder, obstructive sleep disorder, DM Type II, Morbid obesity.   PRECAUTIONS: None  WEIGHT BEARING RESTRICTIONS No  PAIN:  Are you having pain? No  FALLS: Has patient fallen in last 6 months? No  LIVING ENVIRONMENT: Lives with: lives with their family Lives in: House/apartment Stairs: No Level Entry Has following equipment at home: Wheelchair (power) and hoyer lift, sit to stand lift  PLOF: Independent  PATIENT GOALS: To be able to engage in more daily care tasks.  OBJECTIVE:   HAND DOMINANCE: Right  ADLs:  Transfers/ambulation related to ADLs: Eating: Pt. reports being able to hold standard utensils, and is starting to engage more in self-feeding tasks, hand to mouth patterns. Pt. Reports that he does as much as he can with the task, and family assists  with  the remainder of the task. Grooming: Pt. Is able to initiate holding an electric toothbrush, and brush his teeth. Family assists LB Dressing: Total Assist UE dressing: Pt. is now able to reach up to actively assist with grasping , and pulling his gown down. Toileting:  Total Assist Bathing: MaxA UB, Total assist LB Tub Shower transfers: N/A Equipment: See above    IADLs: Shopping: Relies on family to assist Light housekeeping: Total Assist Meal Prep: Total Assist Community mobility:   Medication management:  Total Assist  Financial management: N/A Handwriting: Not legible: Pt. Is able to hold a pen with the left hand,  and initiate marking the page. Pt.'s eye glasses were not available  MOBILITY STATUS:  Power w/c  POSTURE COMMENTS:  Pt. Requires position changes in his power w/c  ACTIVITY TOLERANCE: Activity tolerance:  Fair  FUNCTIONAL OUTCOME MEASURES: FOTO: 40  TR score: 45  05/25/2022:   FOTO: 50 TR score: 45   UPPER EXTREMITY ROM     Active ROM Right eval Right 03/21/2022 Right 05/25/2022  Left eval Left  03/21/2022 Left 05/25/2022  Shoulder flexion 106 scaption 105  Scaption 62 flexion 110 scaption  119 123 125  Shoulder abduction 114 90 97  110 115 115  Shoulder adduction         Shoulder extension         Shoulder internal rotation         Shoulder external rotation         Elbow flexion 120(130) 145 145  135(135) 145 145  Elbow extension -45(-35) -22(-35) -28(-22)  -27(-18) -21(-20) -20(-16)  Wrist flexion         Wrist extension -30(10) -25(10) -10(30)  10(50) 19(50) 28(50)  Wrist ulnar deviation         Wrist radial deviation         Wrist pronation         Wrist supination   Limited by flexor tone    Limited by flexor tone  (Blank rows = not tested)      UPPER EXTREMITY MMT:     Right eval Right 03/21/2022 Right  05/25/2022  Left eval Left 03/21/2022 Left  05/25/2022  Shoulder flexion 3-/5 scaption 3-/5 3-/5  3-/5 3/5 3+/5  Shoulder abduction 3-/5 3-/5 3-/5  3-/5 3-/5 4-/5  Shoulder adduction         Shoulder extension         Shoulder internal rotation         Shoulder external rotation         Elbow flexion 3/5 3/5 3+/5  3/5 3+/5 4-/5  Elbow extension 2-/5 2/5 3-/5  2-/5 2/5   Wrist flexion         Wrist extension 2-/5 2-/5 2/5  2/5 2+/5 3-/5  Wrist ulnar deviation         Wrist radial deviation         Wrist pronation         Wrist supination         (Blank rows = not tested)     HAND FUNCTION:  Grip strength: Right: 0 lbs; Left: 0 lbs and Lateral pinch: Right: 5 lbs, Left: 2 lbs  03/21/2022: Lateral pinch: Right: 3.5 lbs, Left: 2  lbs  Bilateral digit PIP/DIP flexion contractures with MP hyperextension with attempts for AROM. Pt. is able to tolerate AROM to the bilateral digits at the initial evaluation however, has a history of pain in the digits.  COORDINATION:  Eval: Pt. is unable to grasp 9-hole test pegs. Pt. is able to initiate grasping larger pegs, and is able to hold a pen in the left hand.  SENSATION: Light touch: Impaired   EDEMA:  N/A  MUSCLE TONE: BUE flexor Spasticity  COGNITIO Overall cognitive status: Continue to assess in functional context  VISION:   Subjective report: Pt. was not wearing glasses at the time of the initial eval.  Baseline vision: Vision is very limited. Wears glasses all the time Visual history: History of impaired vision following CVA. Pt. Has received treatment through the [facility] low vision rehabilitation program.   VISION ASSESSMENT: Impaired To be further assessed in functional context  PERCEPTION: Impaired   PRAXIS: Impaired: motor planning  OBSERVATIONS:  Pt reports being on Tramadol   TODAY'S TREATMENT:    Measurements were obtained, and goals were reviewed  with the Pt.     PATIENT EDUCATION: Education details: There. Margorie John Person educated: Patient and Parent Education method: Explanation, Demonstration, Tactile cues, and Verbal cues Education comprehension: verbalized understanding, returned demonstration, verbal cues required, and needs further education   HOME EXERCISE PROGRAM:   Continue ongoing assessment, and continue to provide as needed.     GOALS: Goals reviewed with patient? Yes  SHORT TERM GOALS: Target date:05/03/2022     To assess splint fit, and make appropriate adjustments to promote good skin integrity through the palmar surface of the bilateral hands.  Baseline: 05/25/22: Goal currently met, however ongoing as needs to assess splint fit arise. 03/23/2022: Pt. is wearing splints a couple of hours at night bilateral  resting hand splints. 03/21/2022: Pt. is wearing splints a couple of hours at night bilateral resting hand splints. Goal status: Continue ongoing assessment as requested, and as needed   LONG TERM GOALS: Target date: 06/13/2022     FOTO score will Improve by 2 points for Pt. perceived improvement with the assessment specific ADL/IADL tasks.  Baseline: 05/25/2022: FOTO score: 50,  TR score: 45 Eval: FOTO score: 40,  TR score: 45 Goal status: Achieved   2.   Pt. will independently perform oral care for 100% of the task after complete set-up. Baseline: 05/25/2022:  Pt. Is able to initiate and perform oral care for approximately 90% of the task. Complete set-up required. Assi needed only for the very back teeth. 03/23/2022: Pt. Is able to initiate and perform oral care for approximately 75% of the task. Pt. Requires assist at proximally at the elbow for through oral care. 03/21/2022: Pt. Is able to initiate and perform oral care for approximately 75% of the task. Pt. Requires assist at proximally at the elbow for through oral care. Eval: Pt. is able to initiate using an electric toothbrush. Pt. requires assist for set-up, and assist for thoroughness, and as he Pt. fatigues. Goal status: Ongoing  3.  Pt. Will be independent with self-feeding for 100% of the meal after complete set-up Baseline: 05/25/2022: Pt. Is able to use a spoon to scoop cereal when feeding himself cereal 85% of the time. Pt. Is able to feed himself snack/finger foods 100% of the time. Pt. Continues to work on consistency of  stabilizing a cup/mug when drinking. Pt. Is able to grasp a water bottle with assist initially, with assist tapering off as he drinks.03/23/2022: Pt. Is able to perform scooping cereal for 75% of the time. Pt. required assist, and support at the left elbow, and Pt. Presents with limited forearm supination when using the spoon, and bringing it  towards his mouth. Pt. Is able to use a fork to spear items, and perform the  hand to mouth pattern.  03/21/2022: Pt. Is able to perform scooping cereal for 75% of the time. Pt. required assist, and support at the left elbow, and Pt. presents with limited forearm supination when using the spoon, and bringing it towards his mouth. Pt. Is able to use a fork to spear items, and perform the hand to mouth pattern.  Eval: Pt. is able to hold standard standard utensils. Pt. Performs as much of the task as he, can and has assistance for the remainder. Goal status: Ongoing  4.  Pt. Will improve grasp patterns and consistently grasp 1/2" objects for ADL, and IADL tasks.  Baseline:05/25/2022: Pt. Is working on improving consistency of grasping 1/2" objects with visual cues.  03/23/2022: Pt. Is able to grasp 1" objects consistently,and continues to work on the hand patterns needed to grasp 1/2" objects.03/21/2022: Pt. Is able to grasp 1" objects consistently,and continues to work on the hand patterns needed to grasp 1/2" objects. Eval: Pt. is able to grasp 1" objects intermittently using a lateral grasp pattern. Goal status: Ongoing  5.  Pt. will independently write his name legibly with letter sizes under 2". Baseline: 05/25/2022: Pt. to continue to work towards formulating grasp patterns in preparation for grasping a large width pen. Pt. Requires visual cues. 03/23/2022: Pt. Is able to write his name with modA, however has difficulty with formulating letter sizes less than 2" in size with 50% legibility for the 3 letters of his name.03/21/2022: Pt. Is able to write his name with modA, however has difficulty with formulating letter sizes less than 2" in size with 50% legibility for the 3 letters of his name. Eval: Pt is able to hold a thin marker with his left hand, and formulate a line, and initiate a circular pattern (Pt. without glasses today) Goal status: Ongoing  6. Pt. Will reach up to comb/brush his hair  with minA.  Baseline: 05/25/2022: Pt. Is able to reach up with the left hand to the  left side, top, and back of his head. 03/23/2022: Pt. is now able to more consistently initiate reaching up to his head with his left hand in preparation for haircare12/18/2023: Pt. is now able to more consistently initiate reaching up to his head with his left hand in preparation for haircare. Eval: Pt. is able to initiate reaching up for hair care with a long handled brush, however is unable to sustain UE's in elevation to perform the task.     Goal status: Ongoing  7. Pt. Will independently navigate the w/c through his environment with minA with visual scanning, and hand placement on the controls.  Baseline: 05/25/2022: Pt. Requires minA  and  cues to navigate the w/c in wide spaces, and requires Mod cues to navigate the w/c through more narrow doorways, and tighter turns. Pt. Requires max cues for scanning through the environment, and moderate cues for hand placement on the controls.     Goal status:Ongoing       ASSESSMENT:  CLINICAL IMPRESSION:  Pt. has made excellent progress over this past progress/recertification period. Pt. Has progressed with BUE ROM, strength, activity tolerance, and hand function with consistency, and accuracy of formulating grasp patterns. Pt. Continues to work on improving grasp patterns on smaller 1/2" objects in preparation for using them during functional tasks. Pt. Is improving with bringing his bilateral  hands to midline with assist for stabilization. Pt. Is  making progress towards goals, and continues to improve with self-grooming skills, and self-feeding skills with more accuracy. Pt. Is able to handle utensils. Pt. Continues to work on the skills needed to reach up further, and sustain UEs in elevation during self-grooming tasks, as well as combinations of movements needed in the elbow forearm, and wrist to stabilize, and support cups firmly when drinking, as well as hold and drink from a water bottle. FOTO score is 50, TR score is 45. Pt. continues to work on  improving BUE functioning, w/c navigation, and visual compensatory strategies to improve, and maximize independence with ADLs, and IADLs.     PERFORMANCE DEFICITS in functional skills including ADLs, IADLs, coordination, dexterity, proprioception, ROM, strength, pain, [place], [facility], decreased knowledge of use of DME, and UE functional use, cognitive skills including safety awareness, and psychosocial skills including coping strategies, environmental adaptation, habits, and routines and behaviors.   IMPAIRMENTS are limiting patient from ADLs, IADLs, leisure, and social participation.   COMORBIDITIES may have co-morbidities  that affects occupational performance. Patient will benefit from skilled OT to address above impairments and improve overall function.  MODIFICATION OR ASSISTANCE TO COMPLETE EVALUATION: Maximum or significant modification of tasks or assist is necessary to complete an evaluation.  OT OCCUPATIONAL PROFILE AND HISTORY: Comprehensive assessment: Review of records and extensive additional review of physical, cognitive, psychosocial history related to current functional performance.  CLINICAL DECISION MAKING: High - multiple treatment options, significant modification of task necessary  REHAB POTENTIAL: Fair    EVALUATION COMPLEXITY: High    PLAN: OT FREQUENCY: 2 x's a week  OT DURATION:12 weeks  PLANNED INTERVENTIONS: self care/ADL training, therapeutic exercise, therapeutic activity, neuromuscular re-education, manual therapy, passive range of motion, patient/family education, and cognitive remediation/compensation  RECOMMENDED OTHER SERVICES: PT  CONSULTED AND AGREED WITH PLAN OF CARE: Patient and family member/caregiver  PLAN FOR NEXT SESSION: Initiate treatment   Harrel Carina, MS, OTR/L   Harrel Carina, OT 05/25/2022, 10:58 PM

## 2022-05-25 NOTE — Therapy (Signed)
OUTPATIENT PHYSICAL THERAPY TREATMENT NOTE   Patient Name: Paul Hall MRN: [id number] DOB:01/14/1996, 27 y.o., male Today's Date: 05/26/2022  PCP:  Cindi Carbon PROVIDER:  Lenise Herald, PA-C   PT End of Session - 05/25/22 1428     Visit Number 88    Number of Visits 99    Date for PT Re-Evaluation 06/01/22    Authorization Type [employer] and [employer]- Need auth past 12th visit    Authorization Time Period 01/04/21-03/29/21; Recert 03/24/2021-06/16/2021; Recert 09/15/2021- 12/08/2021; Recert 12/13/2021-03/07/2022    Progress Note Due on Visit 90    PT Start Time 1615    PT Stop Time 1645    PT Time Calculation (min) 30 min    Equipment Utilized During Treatment Gait belt   Hoyer lift   Activity Tolerance Patient tolerated treatment well   queasiness/nausea   Behavior During Therapy WFL for tasks assessed/performed                       Past Medical History:  Diagnosis Date   Diabetes mellitus (HCC)    Hypertension    Stroke [facility])    Past Surgical History:  Procedure Laterality Date   IVC FILTER PLACEMENT ([facility] HX)     LEG SURGERY     PEG TUBE PLACEMENT     TRACHEOSTOMY     Patient Active Problem List   Diagnosis Date Noted   Sepsis due to vancomycin resistant Enterococcus species (HCC) 06/06/2019   SIRS (systemic inflammatory response syndrome) (HCC) 06/05/2019   Acute lower UTI 06/05/2019   VRE (vancomycin-resistant Enterococci) infection 06/05/2019   Anemia 06/05/2019   Skin ulcer of sacrum with necrosis of muscle (HCC)    Urinary retention    Type 2 diabetes mellitus without complication, with long-term current use of insulin (HCC)    Tachycardia    Lower extremity edema    Acute metabolic encephalopathy    Obstructive sleep apnea    Morbid obesity with BMI of 60.0-69.9, adult (HCC)    Goals of care, counseling/discussion    Palliative care encounter    Sepsis (HCC) 04/27/2019   H/O insulin dependent diabetes mellitus 04/27/2019    History of CVA with residual deficit 04/27/2019   Seizure disorder (HCC) 04/27/2019   Decubitus ulcer of sacral region, stage 4 (HCC) 04/27/2019    REFERRING DIAG: Cerebral infarction, unspecified   THERAPY DIAG:  Muscle weakness (generalized)  Difficulty in walking, not elsewhere classified  History of CVA with residual deficit  Unsteadiness on feet  Abnormality of gait and mobility  Other abnormalities of gait and mobility  Other lack of coordination  Rationale for Evaluation and Treatment Rehabilitation  PERTINENT HISTORY: Paul Hall is a 25yoM who presents with severe weakness, quadriparesis, altered sensorium, and visual impairment s/p critical illness and prolonged hospitalization. Pt hospitalized in October 2020 with ARDS 2/2 COVID19 infection. Pt sustained a complex and lengthy hospitalization which included tracheostomy, prolonged sedation, ECMO. In this period pt sustained CVA and SDH. Pt has now been liberated from tracheostomy and G-tube. Pt has since been hospitalized for wound infection and UTI. Pt lives with parents at home, has hospital bed and left chair, hoyer lift transfers, and power WC for mobility needs. Pt needs heavy physical assistance with ADL 2/2 BUE contractures and motor dysfunction   PRECAUTIONS: Fall  SUBJECTIVE: Patient reports late due to transportation issues. States today has been a tough day mentally. Agreeable to abbreviated session but reports some right hip pain  Patient reports PAIN:  Are you having pain? No   TODAY'S TREATMENT:    Therex:   PROM to Left LE initially- hip/knee/ankle then gentle PROM to right LE.    AROM Lef hip flexion: 2setsx12 reps each LE AROM - LAQ 2 sets of 10 reps each LE AROM- Seated hip abd- left LE 2 sets of 10 reps AROM- heel raises each LE x 15 reps AROM- Hip add/abd - 12 reps reps.  Manual resistive Leg press each LE - 15 reps  Pt educated throughout session about proper posture and technique  with exercises. Improved exercise technique, movement at target joints, use of target muscles after min to mod verbal, visual, tactile cues.           PATIENT EDUCATION: Education details: posture cues with sitting Person educated: Patient Education method: Explanation, Demonstration, Tactile cues, and Verbal cues Education comprehension: verbalized understanding, returned demonstration, verbal cues required, tactile cues required, and needs further education   HOME EXERCISE PROGRAM:  Access Code: [email or web address] URL: [email or web address] Date: 03/23/2022 Prepared by: Maureen Ralphs  Exercises - Supine Bridge  - 3 x weekly - 3 sets - 10 reps - 2 hold - Supine Gluteal Sets  - 3 x weekly - 3 sets - 10 reps - 5 sec hold - Supine Quad Set  - 1 x daily - 3 x weekly - 3 sets - 10 reps - 5 hold - Seated Long Arc Quad  - 1 x daily - 7 x weekly - 3 sets - 10 reps - Seated Hip Adduction Squeeze with Ball  - 1 x daily - 3 x weekly - 3 sets - 10 reps - 5 hold - Seated Hip Abduction  - 1 x daily - 3 x weekly - 3 sets - 10 reps - 2 hold    PT Short Term Goals -       PT SHORT TERM GOAL #1   Title Pt will be independent with HEP in order to improve strength and balance in order to decrease fall risk and improve function at home and work.    Baseline 01/04/2021= No formal HEP in place; 12/12 no HEP in place; 05/10/2021-Patient and his father were able to report compliance with curent HEP consisting of mostly seated/reclined LE strengthening. Both verbalize no questions at this time.    Time 6    Period Weeks    Status Achieved    Target Date 02/15/21              PT Long Term Goals - Date for completion: 06/01/2022         PT LONG TERM GOAL #1   Title Patient will increase BLE gross strength by 1/2 muscle grade to improve functional strength for improved independence with potential gait, increased standing tolerance and increased ADL ability.    Baseline 01/04/2021-  Patient presents with 1/5 to 3-/5 B LE strength with MMT; 12/12: goal partially met for Left knee/hip; 05/10/2021= 2-/5 bilateal Hip flex; 3+/5 bilateral Knee ext; 06/21/2021= Patient presents with 2-/5 bilateral Hip flex; 3+/5 bilateral knee ext/flex; 2-/5 left ankle DF; 0/5 right ankle- and able to increase reps and resistance with LE's. 09/15/2021- Patient technically presents with 2-/5 B hip flex/abd/add - but he is able to raise his hip up to approx 100 deg which has improved. 3+/5 Bilateral knee ext, 2-/5 left ankle and 0/5 right ankle.  12/08/2021= Patient able to lift left knee at 110 deg of hip flex; presents with 3+/5 knee  ext, 2-/5 left ankle DF and 0/5 right ankle DF, 2-/5 bilateral Hip abd in seated position.   12/6: R: knee 3+/5 ext, 2/5 flexion, left knee 3+/5 extension, 3+/5 flexion, R hip: 2+/5 hip add, 2+/5 hip ABD L hip: 4-/5 hip ABD, 3+/5 hip ADD, 3+/5 hip flexion    Time 12    Period Weeks    Status Partially met, met with exception of ankle musculature on the R LE    Target Date 03/07/2022     PT LONG TERM GOAL #2   Title Patient will tolerate sitting unsupported demonstrating erect sitting posture for 15 minutes with CGA to demonstrate improved back extensor strength and improved sitting tolerance.    Baseline 01/04/2021- Patient confied to sitting in lift chair or electric power chair with back support and unable to sit upright without physical assistance; 12/12: tolerates <1 minutes upright unsupported sitting. 05/10/2021=static sit with forward trunk lean  in his power wheelchair without back support x approx 3 min. 06/21/2021=Unable to assess today due to patient with acute back pain but on previous visit able to sit x 8 min without back support. 09/15/2021- on last visit- 09/13/2021- patient was able to sit unsupported x 8 min at edge of mat. 10/13/2023 - Patient was able to sit at edge of mat with varying level of assist today from SBA to min A for a total of 20 min. 12/13/2021= Patient  demonstrated unsupported sitting at edge of mat for approx 20 min   Time 12    Period Weeks    Status GOAL MET   Target Date 12/08/21      PT LONG TERM GOAL #3   Title Patient will demonstrate ability to perform static standing in // bars > 2 min with Max Assist  without loss of balance and fair posture for improved overall strength for pre-gait and transfer activities.    Baseline 01/04/2021= Patient current uanble to stand- Dependent on hoyer or sit to stand lift for transfers. 05/10/2021=Not appropriate yet- Currently still dependent with all transfers using hoyer. 06/21/2021= Patient continuing now to focus on LE strengthening to prepare for standing-unable to try today due to acute low back pain-  planning on attempting in new cert period. 09/15/2021- Patient has attempted standing 2x in past two week- max Assist of 2 people - only once was he successful to clearing his bottom from chair - Will continue to be a focus during the new certification. 12/13/2021= Patient has been limited secondary to increased overall low back pain during this certification and will require more time to focus on this goal.  12/6: not assessed this date, will assess at date when 2-3 PTs are present for assistance    Time 12    Period Weeks    Status On-going          PT LONG TERM GOAL #4   Title Pt will improve FOTO score by 10 points or more demonstrating improved perceived functional ability    Baseline FOTO 7 on 10/17; 03/15/21: FOTO 12; 05/10/2021 06/21/2021= 1; 09/15/2021= 9; 12/13/2021= Will issue next visit 12/6: 4   Time 12    Period Weeks    Status Not Met          PT LONG TERM GOAL #5   Title Patient will perform sit to stand transfer with appropriate AD and max assist of 2 people with 75% consistency to prepare for pregait activities.    Baseline 09/15/2021= Patient unable to stand well- unable  to clear his bottom off chair with Max assist of 2 persons. 12/13/2021- Goal not appropriate to try yet but will keep  and roll over to next cert as shift continues to focus on transfers/standing   Time 12    Period Weeks    Status New        PT LONG TERM GOAL #6  Title Patient will tolerate sitting unsupported demonstrating erect sitting posture for 30 minutes with CGA to demonstrate improved back extensor strength and improved sitting tolerance.   Baseline 12/13/2021= Patient demonstrated unsupported sitting at edge of mat for approx 20 min;  12/29/2021- Patient performed approx 30 min of dynamic sitting activities today.   Time 12   Period Weeks   Status ONGOING         7.  Patient will tolerate 5 minutes or more of standing in sit to stand lift in order to indicate improved lower extremity weightbearing tolerance for progression to standing in parallel bars. Baseline: 1 minute on most recent stand 02/21/22 Goal status: INITIAL       Plan     Clinical Impression Statement Session limited today due to patient arrived late due to transportation issues. He was also limited by some right hip pain today and mostly exercised his left LE and at end of session he was feeling some better and able to perform some activities on right. He continues to present with significant muscle weakness yet able to complete all therex well today.   Pt will continue to benefit from skilled physical therapy intervention to address impairments, improve QOL, and attain therapy goals.    Personal Factors and Comorbidities Comorbidity 3+;Time since onset of injury/illness/exacerbation    Comorbidities CVA, diabetes, Seizures    Examination-Activity Limitations Bathing;Bed Mobility;Bend;Caring for Others;Carry;Dressing;Hygiene/Grooming;Lift;Locomotion Level;Reach Overhead;Self Feeding;Sit;Squat;Stairs;Stand;Transfers;Toileting    Examination-Participation Restrictions Cleaning;Community Activity;Driving;Laundry;Medication Management;Meal Prep;Occupation;Personal Finances;Shop;Yard Work;Volunteer    Stability/Clinical Decision Making  Evolving/Moderate complexity    Rehab Potential Fair    PT Frequency 2x / week    PT Duration 12 weeks    PT Treatment/Interventions ADLs/Self Care Home Management;Cryotherapy;Electrical Stimulation;Moist Heat;Ultrasound;DME Instruction;Gait training;Stair training;Functional mobility training;Therapeutic exercise;Balance training;Patient/family education;Orthotic Fit/Training;Neuromuscular re-education;Wheelchair mobility training;Manual techniques;Passive range of motion;Dry needling;Energy conservation;Taping;Visual/perceptual remediation/compensation;Joint Manipulations    PT Next Visit Plan core strength- sitting at EOM, postural control, sabina sit to stand if able; Continue with progressive LE Strengthening    PT Home Exercise Plan No changes to HEP today    Consulted and Agree with Plan of Care Patient;Family member/caregiver    Family Member Consulted Mom             Louis Meckel, PT Physical Therapist- [facility]  [facility] [address]  05/26/2022, 3:38 PM

## 2022-05-30 ENCOUNTER — Ambulatory Visit: Payer: BC Managed Care – PPO

## 2022-05-30 ENCOUNTER — Ambulatory Visit: Payer: BC Managed Care – PPO | Admitting: Occupational Therapy

## 2022-06-01 ENCOUNTER — Ambulatory Visit: Payer: BC Managed Care – PPO

## 2022-06-01 ENCOUNTER — Encounter: Payer: BC Managed Care – PPO | Admitting: Occupational Therapy

## 2022-06-01 ENCOUNTER — Ambulatory Visit: Payer: BC Managed Care – PPO | Admitting: Occupational Therapy

## 2022-06-01 DIAGNOSIS — I6930 Unspecified sequelae of cerebral infarction: Secondary | ICD-10-CM | POA: Diagnosis not present

## 2022-06-01 DIAGNOSIS — R278 Other lack of coordination: Secondary | ICD-10-CM

## 2022-06-01 DIAGNOSIS — M6281 Muscle weakness (generalized): Secondary | ICD-10-CM

## 2022-06-01 DIAGNOSIS — R262 Difficulty in walking, not elsewhere classified: Secondary | ICD-10-CM

## 2022-06-01 DIAGNOSIS — R2689 Other abnormalities of gait and mobility: Secondary | ICD-10-CM

## 2022-06-01 DIAGNOSIS — R269 Unspecified abnormalities of gait and mobility: Secondary | ICD-10-CM

## 2022-06-01 DIAGNOSIS — R2681 Unsteadiness on feet: Secondary | ICD-10-CM

## 2022-06-01 NOTE — Therapy (Addendum)
OUTPATIENT PHYSICAL THERAPY TREATMENT NOTE   Patient Name: Paul Hall MRN: [id number] DOB:1996-01-22, 27 y.o., male Today's Date: 06/06/2022  PCP:  Loistine Chance PROVIDER:  Collene Mares, PA-C   PT End of Session - 06/06/22 0823     Visit Number 86    Number of Visits 29    Date for PT Re-Evaluation 06/01/22    Authorization Type [employer] and [employer]- Need auth past 12th visit    Authorization Time Period 01/04/21-03/29/21; Recert [id number]; Recert [id number]; Recert [id number]    Progress Note Due on Visit 90    PT Start Time 1602    PT Stop Time 1645    PT Time Calculation (min) 43 min    Equipment Utilized During Treatment Gait belt   Hoyer lift   Activity Tolerance Patient tolerated treatment well;No increased pain   queasiness/nausea   Behavior During Therapy WFL for tasks assessed/performed                        Past Medical History:  Diagnosis Date   Diabetes mellitus ([place])    Hypertension    Stroke [facility])    Past Surgical History:  Procedure Laterality Date   IVC FILTER PLACEMENT ([place] [facility])     LEG SURGERY     PEG TUBE PLACEMENT     TRACHEOSTOMY     Patient Active Problem List   Diagnosis Date Noted   Sepsis due to vancomycin resistant Enterococcus species ([place]) 06/06/2019   SIRS (systemic inflammatory response syndrome) ([place]) 06/05/2019   Acute lower UTI 06/05/2019   VRE (vancomycin-resistant Enterococci) infection 06/05/2019   Anemia 06/05/2019   Skin ulcer of sacrum with necrosis of muscle ([place])    Urinary retention    Type 2 diabetes mellitus without complication, with long-term current use of insulin ([facility])    Tachycardia    Lower extremity edema    Acute metabolic encephalopathy    Obstructive sleep apnea    Morbid obesity with BMI of 60.0-69.9, adult ([place])    Goals of care, counseling/discussion    Palliative care encounter    Sepsis ([place]) 04/27/2019   H/O insulin dependent diabetes  mellitus 04/27/2019   History of CVA with residual deficit 04/27/2019   Seizure disorder ([place]) 04/27/2019   Decubitus ulcer of sacral region, stage 4 ([place]) 04/27/2019    REFERRING DIAG: Cerebral infarction, unspecified   THERAPY DIAG:  Muscle weakness (generalized)  Other lack of coordination  Difficulty in walking, not elsewhere classified  History of CVA with residual deficit  Unsteadiness on feet  Abnormality of gait and mobility  Other abnormalities of gait and mobility  Rationale for Evaluation and Treatment Rehabilitation  PERTINENT HISTORY: Paul Hall is a 25yoM who presents with severe weakness, quadriparesis, altered sensorium, and visual impairment s/p critical illness and prolonged hospitalization. Pt hospitalized in October 2020 with ARDS 2/2 COVID19 infection. Pt sustained a complex and lengthy hospitalization which included tracheostomy, prolonged sedation, ECMO. In this period pt sustained CVA and SDH. Pt has now been liberated from tracheostomy and G-tube. Pt has since been hospitalized for wound infection and UTI. Pt lives with parents at home, has hospital bed and left chair, hoyer lift transfers, and power WC for mobility needs. Pt needs heavy physical assistance with ADL 2/2 BUE contractures and motor dysfunction   PRECAUTIONS: Fall  SUBJECTIVE: Patient reports ongoing right thigh pain and difficulty moving his right LE but does state some better. Agreeable to therex and light work  with right LE to see if he can work out som of soreness  Patient reports PAIN:  Are you having pain? No   TODAY'S TREATMENT:    Therex:   PROM to Left LE initially- hip/knee/ankle then gentle PROM to right LE x several min   AROM B hip flexion: 2setsx15 reps each LE AROM - LAQ 2 sets of 15 reps each LE AROM- Seated hip abd- left LE 2 sets of 15 reps AROM- heel raises each LE x 15 reps AROM- Hip add/abd - 12 reps reps.  AROM ham curls - 2 set of 15 reps  Pt educated  throughout session about proper posture and technique with exercises. Improved exercise technique, movement at target joints, use of target muscles after min to mod verbal, visual, tactile cues.           PATIENT EDUCATION: Education details: posture cues with sitting Person educated: Patient Education method: Explanation, Demonstration, Tactile cues, and Verbal cues Education comprehension: verbalized understanding, returned demonstration, verbal cues required, tactile cues required, and needs further education   HOME EXERCISE PROGRAM:  Access Code: [email or web address] URL: [email or web address] Date: 03/23/2022 Prepared by: Sande Brothers  Exercises - Supine Bridge  - 3 x weekly - 3 sets - 10 reps - 2 hold - Supine Gluteal Sets  - 3 x weekly - 3 sets - 10 reps - 5 sec hold - Supine Quad Set  - 1 x daily - 3 x weekly - 3 sets - 10 reps - 5 hold - Seated Long Arc Quad  - 1 x daily - 7 x weekly - 3 sets - 10 reps - Seated Hip Adduction Squeeze with Ball  - 1 x daily - 3 x weekly - 3 sets - 10 reps - 5 hold - Seated Hip Abduction  - 1 x daily - 3 x weekly - 3 sets - 10 reps - 2 hold    PT Short Term Goals -       PT SHORT TERM GOAL #1   Title Pt will be independent with HEP in order to improve strength and balance in order to decrease fall risk and improve function at home and work.    Baseline 01/04/2021= No formal HEP in place; 12/12 no HEP in place; 05/10/2021-Patient and his father were able to report compliance with curent HEP consisting of mostly seated/reclined LE strengthening. Both verbalize no questions at this time.    Time 6    Period Weeks    Status Achieved    Target Date 02/15/21              PT Long Term Goals - Date for completion: 06/01/2022         PT LONG TERM GOAL #1   Title Patient will increase BLE gross strength by 1/2 muscle grade to improve functional strength for improved independence with potential gait, increased standing tolerance  and increased ADL ability.    Baseline 01/04/2021- Patient presents with 1/5 to 3-/5 B LE strength with MMT; 12/12: goal partially met for Left knee/hip; 05/10/2021= 2-/5 bilateal Hip flex; 3+/5 bilateral Knee ext; 06/21/2021= Patient presents with 2-/5 bilateral Hip flex; 3+/5 bilateral knee ext/flex; 2-/5 left ankle DF; 0/5 right ankle- and able to increase reps and resistance with LE's. 09/15/2021- Patient technically presents with 2-/5 B hip flex/abd/add - but he is able to raise his hip up to approx 100 deg which has improved. 3+/5 Bilateral knee ext, 2-/5 left ankle and 0/5 right ankle.  12/08/2021=  Patient able to lift left knee at 110 deg of hip flex; presents with 3+/5 knee ext, 2-/5 left ankle DF and 0/5 right ankle DF, 2-/5 bilateral Hip abd in seated position.   12/6: R: knee 3+/5 ext, 2/5 flexion, left knee 3+/5 extension, 3+/5 flexion, R hip: 2+/5 hip add, 2+/5 hip ABD L hip: 4-/5 hip ABD, 3+/5 hip ADD, 3+/5 hip flexion    Time 12    Period Weeks    Status Partially met, met with exception of ankle musculature on the R LE    Target Date 03/07/2022     PT LONG TERM GOAL #2   Title Patient will tolerate sitting unsupported demonstrating erect sitting posture for 15 minutes with CGA to demonstrate improved back extensor strength and improved sitting tolerance.    Baseline 01/04/2021- Patient confied to sitting in lift chair or electric power chair with back support and unable to sit upright without physical assistance; 12/12: tolerates <1 minutes upright unsupported sitting. 05/10/2021=static sit with forward trunk lean  in his power wheelchair without back support x approx 3 min. 06/21/2021=Unable to assess today due to patient with acute back pain but on previous visit able to sit x 8 min without back support. 09/15/2021- on last visit- 09/13/2021- patient was able to sit unsupported x 8 min at edge of mat. 10/13/2023 - Patient was able to sit at edge of mat with varying level of assist today from SBA to  min A for a total of 20 min. 12/13/2021= Patient demonstrated unsupported sitting at edge of mat for approx 20 min   Time 12    Period Weeks    Status GOAL MET   Target Date 12/08/21      PT LONG TERM GOAL #3   Title Patient will demonstrate ability to perform static standing in // bars > 2 min with Max Assist  without loss of balance and fair posture for improved overall strength for pre-gait and transfer activities.    Baseline 01/04/2021= Patient current uanble to stand- Dependent on hoyer or sit to stand lift for transfers. 05/10/2021=Not appropriate yet- Currently still dependent with all transfers using hoyer. 06/21/2021= Patient continuing now to focus on LE strengthening to prepare for standing-unable to try today due to acute low back pain-  planning on attempting in new cert period. 09/15/2021- Patient has attempted standing 2x in past two week- max Assist of 2 people - only once was he successful to clearing his bottom from chair - Will continue to be a focus during the new certification. 12/13/2021= Patient has been limited secondary to increased overall low back pain during this certification and will require more time to focus on this goal.  12/6: not assessed this date, will assess at date when 2-3 PTs are present for assistance    Time 12    Period Weeks    Status On-going          PT LONG TERM GOAL #4   Title Pt will improve FOTO score by 10 points or more demonstrating improved perceived functional ability    Baseline FOTO 7 on 10/17; 03/15/21: FOTO 12; 05/10/2021 06/21/2021= 1; 09/15/2021= 9; 12/13/2021= Will issue next visit 12/6: 4   Time 12    Period Weeks    Status Not Met          PT LONG TERM GOAL #5   Title Patient will perform sit to stand transfer with appropriate AD and max assist of 2 people with 75% consistency  to prepare for pregait activities.    Baseline 09/15/2021= Patient unable to stand well- unable to clear his bottom off chair with Max assist of 2 persons.  12/13/2021- Goal not appropriate to try yet but will keep and roll over to next cert as shift continues to focus on transfers/standing   Time 12    Period Weeks    Status New        PT LONG TERM GOAL #6  Title Patient will tolerate sitting unsupported demonstrating erect sitting posture for 30 minutes with CGA to demonstrate improved back extensor strength and improved sitting tolerance.   Baseline 12/13/2021= Patient demonstrated unsupported sitting at edge of mat for approx 20 min;  12/29/2021- Patient performed approx 30 min of dynamic sitting activities today.   Time 12   Period Weeks   Status ONGOING         7.  Patient will tolerate 5 minutes or more of standing in sit to stand lift in order to indicate improved lower extremity weightbearing tolerance for progression to standing in parallel bars. Baseline: 1 minute on most recent stand 02/21/22 Goal status: INITIAL       Plan     Clinical Impression Statement Since patient still recovering from right LE weakness- focused more on PROM to loosen up the right LE and then he was able to participate pretty well with all activities without any report of increased right LE pain. Deferred recert visit to next session so hopefully he will feel better and be able to assess more mobility goals including transfer/standing.   Pt will continue to benefit from skilled physical therapy intervention to address impairments, improve QOL, and attain therapy goals.    Personal Factors and Comorbidities Comorbidity 3+;Time since onset of injury/illness/exacerbation    Comorbidities CVA, diabetes, Seizures    Examination-Activity Limitations Bathing;Bed Mobility;Bend;Caring for Others;Carry;Dressing;Hygiene/Grooming;Lift;Locomotion Level;Reach Overhead;Self Feeding;Sit;Squat;Stairs;Stand;Transfers;Toileting    Examination-Participation Restrictions Cleaning;Community Activity;Driving;Laundry;Medication Management;Meal Prep;Occupation;Personal  Finances;Shop;Yard Work;Volunteer    Stability/Clinical Decision Making Evolving/Moderate complexity    Rehab Potential Fair    PT Frequency 2x / week    PT Duration 12 weeks    PT Treatment/Interventions ADLs/Self Care Home Management;Cryotherapy;Electrical Stimulation;Moist Heat;Ultrasound;DME Instruction;Gait training;Stair training;Functional mobility training;Therapeutic exercise;Balance training;Patient/family education;Orthotic Fit/Training;Neuromuscular re-education;Wheelchair mobility training;Manual techniques;Passive range of motion;Dry needling;Energy conservation;Taping;Visual/perceptual remediation/compensation;Joint Manipulations    PT Next Visit Plan core strength- sitting at EOM, postural control, sabina sit to stand if able; Continue with progressive LE Strengthening. Recert next visit   PT Home Exercise Plan No changes to HEP today    Consulted and Agree with Plan of Care Patient;Family member/caregiver    Family Member Consulted Mom             Ollen Bowl, PT Physical Therapist- [facility]  06/06/2022, 8:24 AM

## 2022-06-02 NOTE — Therapy (Signed)
Occupational Therapy Treatment Note  Patient Name: Rolf Stangland MRN: [id number] DOB:02-24-1996, 27 y.o., male Today's Date: 06/02/2022  PCP: Dr. Gerarda Fraction REFERRING PROVIDER: Dr. Gerarda Fraction   OT End of Session - 06/02/22 0846     Visit Number 21    Number of Visits 42    Date for OT Re-Evaluation 08/17/22    Authorization Type Progress report period starting 10/06/2021    OT Start Time 1650    OT Stop Time 1730    OT Time Calculation (min) 40 min    Activity Tolerance Patient tolerated treatment well    Behavior During Therapy WFL for tasks assessed/performed                     Past Medical History:  Diagnosis Date   Diabetes mellitus ([place])    Hypertension    Stroke [facility])    Past Surgical History:  Procedure Laterality Date   IVC FILTER PLACEMENT ([place] [facility])     LEG SURGERY     PEG TUBE PLACEMENT     TRACHEOSTOMY     Patient Active Problem List   Diagnosis Date Noted   Sepsis due to vancomycin resistant Enterococcus species ([place]) 06/06/2019   SIRS (systemic inflammatory response syndrome) ([place]) 06/05/2019   Acute lower UTI 06/05/2019   VRE (vancomycin-resistant Enterococci) infection 06/05/2019   Anemia 06/05/2019   Skin ulcer of sacrum with necrosis of muscle ([place])    Urinary retention    Type 2 diabetes mellitus without complication, with long-term current use of insulin ([facility])    Tachycardia    Lower extremity edema    Acute metabolic encephalopathy    Obstructive sleep apnea    Morbid obesity with BMI of 60.0-69.9, adult ([place])    Goals of care, counseling/discussion    Palliative care encounter    Sepsis ([place]) 04/27/2019   H/O insulin dependent diabetes mellitus 04/27/2019   History of CVA with residual deficit 04/27/2019   Seizure disorder ([place]) 04/27/2019   Decubitus ulcer of sacral region, stage 4 ([place]) 04/27/2019    ONSET DATE: 01/2019  REFERRING DIAG: CVA/COVID-19  THERAPY DIAG:  Muscle weakness (generalized)  Other lack of  coordination  Rationale for Evaluation and Treatment Rehabilitation  SUBJECTIVE:   SUBJECTIVE STATEMENT:    Pt. reports having Botox injections on Friday.   Pt accompanied by: self and family member  PERTINENT HISTORY:  Pt. is a 27 y.o. male who was diagnosed with COVID-19, and CVA with resultant quadriplegia. Pt. Was then hospitalized with VRE UTI. PMHx includes: urinary retention, seizure disorder, obstructive sleep disorder, DM Type II, Morbid obesity.   PRECAUTIONS: None  WEIGHT BEARING RESTRICTIONS No  PAIN:  Are you having pain? No  FALLS: Has patient fallen in last 6 months? No  LIVING ENVIRONMENT: Lives with: lives with their family Lives in: House/apartment Stairs: No Level Entry Has following equipment at home: Wheelchair (power) and hoyer lift, sit to stand lift  PLOF: Independent  PATIENT GOALS: To be able to engage in more daily care tasks.  OBJECTIVE:   HAND DOMINANCE: Right  ADLs:  Transfers/ambulation related to ADLs: Eating: Pt. reports being able to hold standard utensils, and is starting to engage more in self-feeding tasks, hand to mouth patterns. Pt. Reports that he does as much as he can with the task, and family assists  with the remainder of the task. Grooming: Pt. Is able to initiate holding an electric toothbrush, and brush his teeth. Family assists LB Dressing: Total  Assist UE dressing: Pt. is now able to reach up to actively assist with grasping , and pulling his gown down. Toileting:  Total Assist Bathing: MaxA UB, Total assist LB Tub Shower transfers: N/A Equipment: See above    IADLs: Shopping: Relies on family to assist Light housekeeping: Total Assist Meal Prep: Total Assist Community mobility:   Medication management:  Total Assist  Financial management: N/A Handwriting: Not legible: Pt. Is able to hold a pen with the left hand, and initiate marking the page. Pt.'s eye glasses were not available  MOBILITY STATUS:  Power  w/c  POSTURE COMMENTS:  Pt. Requires position changes in his power w/c  ACTIVITY TOLERANCE: Activity tolerance:  Fair  FUNCTIONAL OUTCOME MEASURES: FOTO: 40  TR score: 45  05/25/2022:   FOTO: 50 TR score: 45   UPPER EXTREMITY ROM     Active ROM Right eval Right 03/21/2022 Right 05/25/2022  Left eval Left  03/21/2022 Left 05/25/2022  Shoulder flexion 106 scaption 105  Scaption 62 flexion 110 scaption  119 123 125  Shoulder abduction 114 90 97  110 115 115  Shoulder adduction         Shoulder extension         Shoulder internal rotation         Shoulder external rotation         Elbow flexion 120(130) 145 145  135(135) 145 145  Elbow extension -45(-35) -22(-35) -28(-22)  -27(-18) -21(-20) -20(-16)  Wrist flexion         Wrist extension -30(10) -25(10) -10(30)  10(50) 19(50) 28(50)  Wrist ulnar deviation         Wrist radial deviation         Wrist pronation         Wrist supination   Limited by flexor tone    Limited by flexor tone  (Blank rows = not tested)      UPPER EXTREMITY MMT:     Right eval Right 03/21/2022 Right  05/25/2022  Left eval Left 03/21/2022 Left  05/25/2022  Shoulder flexion 3-/5 scaption 3-/5 3-/5  3-/5 3/5 3+/5  Shoulder abduction 3-/5 3-/5 3-/5  3-/5 3-/5 4-/5  Shoulder adduction         Shoulder extension         Shoulder internal rotation         Shoulder external rotation         Elbow flexion 3/5 3/5 3+/5  3/5 3+/5 4-/5  Elbow extension 2-/5 2/5 3-/5  2-/5 2/5   Wrist flexion         Wrist extension 2-/5 2-/5 2/5  2/5 2+/5 3-/5  Wrist ulnar deviation         Wrist radial deviation         Wrist pronation         Wrist supination         (Blank rows = not tested)     HAND FUNCTION:  Grip strength: Right: 0 lbs; Left: 0 lbs and Lateral pinch: Right: 5 lbs, Left: 2 lbs  03/21/2022: Lateral pinch: Right: 3.5 lbs, Left: 2 lbs  Bilateral digit PIP/DIP flexion contractures with MP hyperextension with attempts for AROM. Pt. is  able to tolerate AROM to the bilateral digits at the initial evaluation however, has a history of pain in the digits.  COORDINATION:  Eval: Pt. is unable to grasp 9-hole test pegs. Pt. is able to initiate grasping larger pegs, and is able to hold  a pen in the left hand.  SENSATION: Light touch: Impaired   EDEMA:  N/A  MUSCLE TONE: BUE flexor Spasticity  COGNITIO Overall cognitive status: Continue to assess in functional context  VISION:   Subjective report: Pt. was not wearing glasses at the time of the initial eval.  Baseline vision: Vision is very limited. Wears glasses all the time Visual history: History of impaired vision following CVA. Pt. Has received [facility] through the [facility] low vision rehabilitation program.   VISION ASSESSMENT: Impaired To be further assessed in functional context  PERCEPTION: Impaired   PRAXIS: Impaired: motor planning  OBSERVATIONS:  Pt reports being on Tramadol   TODAY'S TREATMENT:   Neuromuscular re-education:  Pt. worked on using his left hand for formulating a lateral grasp pattern, grasping 1" resistive cubes, maintaining the grasp while lifting them off the board against various amounts of resistance. Pt. Worked hand grasps in combinations of movements holding the cubes with a lateral grasp while extending his wrist, supinating his forearm, and reaching into flexion, or abduction to place the cubes on the table.   There. Ex.:  Pt. Worked on left grip strengthening with reps of gross gripping 6.6#. pt. Attempted to use the DigiFlex 1.5#. Pt. was able to align his fingers with assist. Pt. Tolertated PROM to the right wrist, and digits.     PATIENT EDUCATION: Education details: There. Margorie John Person educated: Patient and Parent Education method: Explanation, Demonstration, Tactile cues, and Verbal cues Education comprehension: verbalized understanding, returned demonstration, verbal cues required, and needs further  education   HOME EXERCISE PROGRAM:   Continue ongoing assessment, and continue to provide as needed.     GOALS: Goals reviewed with patient? Yes  SHORT TERM GOALS: Target date:05/03/2022     To assess splint fit, and make appropriate adjustments to promote good skin integrity through the palmar surface of the bilateral hands.  Baseline: 05/25/22: Goal currently met, however ongoing as needs to assess splint fit arise. 03/23/2022: Pt. is wearing splints a couple of hours at night bilateral resting hand splints. 03/21/2022: Pt. is wearing splints a couple of hours at night bilateral resting hand splints. Goal status: Continue ongoing assessment as requested, and as needed   LONG TERM GOALS: Target date: 06/13/2022     FOTO score will Improve by 2 points for Pt. perceived improvement with the assessment specific ADL/IADL tasks.  Baseline: 05/25/2022: FOTO score: 50,  TR score: 45 Eval: FOTO score: 40,  TR score: 45 Goal status: Achieved   2.   Pt. will independently perform oral care for 100% of the task after complete set-up. Baseline: 05/25/2022:  Pt. Is able to initiate and perform oral care for approximately 90% of the task. Complete set-up required. Assi needed only for the very back teeth. 03/23/2022: Pt. Is able to initiate and perform oral care for approximately 75% of the task. Pt. Requires assist at proximally at the elbow for through oral care. 03/21/2022: Pt. Is able to initiate and perform oral care for approximately 75% of the task. Pt. Requires assist at proximally at the elbow for through oral care. Eval: Pt. is able to initiate using an electric toothbrush. Pt. requires assist for set-up, and assist for thoroughness, and as he Pt. fatigues. Goal status: Ongoing  3.  Pt. Will be independent with self-feeding for 100% of the meal after complete set-up Baseline: 05/25/2022: Pt. Is able to use a spoon to scoop cereal when feeding himself cereal 85% of the time. Pt. Is able  to feed  himself snack/finger foods 100% of the time. Pt. Continues to work on consistency of  stabilizing a cup/mug when drinking. Pt. Is able to grasp a water bottle with assist initially, with assist tapering off as he drinks.03/23/2022: Pt. Is able to perform scooping cereal for 75% of the time. Pt. required assist, and support at the left elbow, and Pt. Presents with limited forearm supination when using the spoon, and bringing it towards his mouth. Pt. Is able to use a fork to spear items, and perform the hand to mouth pattern.  03/21/2022: Pt. Is able to perform scooping cereal for 75% of the time. Pt. required assist, and support at the left elbow, and Pt. presents with limited forearm supination when using the spoon, and bringing it towards his mouth. Pt. Is able to use a fork to spear items, and perform the hand to mouth pattern.  Eval: Pt. is able to hold standard standard utensils. Pt. Performs as much of the task as he, can and has assistance for the remainder. Goal status: Ongoing  4.  Pt. Will improve grasp patterns and consistently grasp 1/2" objects for ADL, and IADL tasks.  Baseline:05/25/2022: Pt. Is working on improving consistency of grasping 1/2" objects with visual cues.  03/23/2022: Pt. Is able to grasp 1" objects consistently,and continues to work on the hand patterns needed to grasp 1/2" objects.03/21/2022: Pt. Is able to grasp 1" objects consistently,and continues to work on the hand patterns needed to grasp 1/2" objects. Eval: Pt. is able to grasp 1" objects intermittently using a lateral grasp pattern. Goal status: Ongoing  5.  Pt. will independently write his name legibly with letter sizes under 2". Baseline: 05/25/2022: Pt. to continue to work towards formulating grasp patterns in preparation for grasping a large width pen. Pt. Requires visual cues. 03/23/2022: Pt. Is able to write his name with modA, however has difficulty with formulating letter sizes less than 2" in size with 50%  legibility for the 3 letters of his name.03/21/2022: Pt. Is able to write his name with modA, however has difficulty with formulating letter sizes less than 2" in size with 50% legibility for the 3 letters of his name. Eval: Pt is able to hold a thin marker with his left hand, and formulate a line, and initiate a circular pattern (Pt. without glasses today) Goal status: Ongoing  6. Pt. Will reach up to comb/brush his hair  with minA.  Baseline: 05/25/2022: Pt. Is able to reach up with the left hand to the left side, top, and back of his head. 03/23/2022: Pt. is now able to more consistently initiate reaching up to his head with his left hand in preparation for haircare12/18/2023: Pt. is now able to more consistently initiate reaching up to his head with his left hand in preparation for haircare. Eval: Pt. is able to initiate reaching up for hair care with a long handled brush, however is unable to sustain UE's in elevation to perform the task.     Goal status: Ongoing  7. Pt. Will independently navigate the w/c through his environment with minA with visual scanning, and hand placement on the controls.  Baseline: 05/25/2022: Pt. Requires minA  and  cues to navigate the w/c in wide spaces, and requires Mod cues to navigate the w/c through more narrow doorways, and tighter turns. Pt. Requires max cues for scanning through the environment, and moderate cues for hand placement on the controls.     Goal status:Ongoing  ASSESSMENT:  CLINICAL IMPRESSION:  Pt. tolerated the session well, and was able to consistently use the gross gripper  with 6.6# of grip strength force. Pt. presented with difficulty isolating the digits with finger flexion on the DigiFlex. Pt. is able to grasp the a water bottle with assist initially for hand placement. Pt. was able to stabilize the lateral grasp on the cubes with the left hand with assist for initial hand placement. Pt. continues to work on the skills needed to  reach up further, and sustain UEs in elevation during self-grooming tasks, as well as combinations of movements needed in the elbow forearm, and wrist to stabilize, and support cups firmly when drinking, as well as hold and drink from a water bottle. Pt. Continues to present with Bilateral forearm, wrist, and digit PIP, and DIP tightness. Pt. continues to work on improving BUE functioning, w/c navigation, and visual compensatory strategies to improve, and maximize independence with ADLs, and IADLs.     PERFORMANCE DEFICITS in functional skills including ADLs, IADLs, coordination, dexterity, proprioception, ROM, strength, pain, [place], GMC, decreased knowledge of use of DME, and UE functional use, cognitive skills including safety awareness, and psychosocial skills including coping strategies, environmental adaptation, habits, and routines and behaviors.   IMPAIRMENTS are limiting patient from ADLs, IADLs, leisure, and social participation.   COMORBIDITIES may have co-morbidities  that affects occupational performance. Patient will benefit from skilled OT to address above impairments and improve overall function.  MODIFICATION OR ASSISTANCE TO COMPLETE EVALUATION: Maximum or significant modification of tasks or assist is necessary to complete an evaluation.  OT OCCUPATIONAL PROFILE AND HISTORY: Comprehensive assessment: Review of records and extensive additional review of physical, cognitive, psychosocial history related to current functional performance.  CLINICAL DECISION MAKING: High - multiple treatment options, significant modification of task necessary  REHAB POTENTIAL: Fair    EVALUATION COMPLEXITY: High    PLAN: OT FREQUENCY: 2 x's a week  OT DURATION:12 weeks  PLANNED INTERVENTIONS: self care/ADL training, therapeutic exercise, therapeutic activity, neuromuscular re-education, manual therapy, passive range of motion, patient/family education, and cognitive  remediation/compensation  RECOMMENDED OTHER SERVICES: PT  CONSULTED AND AGREED WITH PLAN OF CARE: Patient and family member/caregiver  PLAN FOR NEXT SESSION: Initiate treatment   Harrel Carina, MS, OTR/L   Harrel Carina, OT 06/02/2022, 8:52 AM

## 2022-06-03 DIAGNOSIS — G8250 Quadriplegia, unspecified: Secondary | ICD-10-CM | POA: Diagnosis not present

## 2022-06-06 ENCOUNTER — Ambulatory Visit: Payer: BC Managed Care – PPO | Attending: Internal Medicine

## 2022-06-06 ENCOUNTER — Ambulatory Visit: Payer: BC Managed Care – PPO | Admitting: Occupational Therapy

## 2022-06-06 DIAGNOSIS — R269 Unspecified abnormalities of gait and mobility: Secondary | ICD-10-CM | POA: Insufficient documentation

## 2022-06-06 DIAGNOSIS — R278 Other lack of coordination: Secondary | ICD-10-CM

## 2022-06-06 DIAGNOSIS — I6930 Unspecified sequelae of cerebral infarction: Secondary | ICD-10-CM | POA: Diagnosis not present

## 2022-06-06 DIAGNOSIS — R2689 Other abnormalities of gait and mobility: Secondary | ICD-10-CM | POA: Insufficient documentation

## 2022-06-06 DIAGNOSIS — R2681 Unsteadiness on feet: Secondary | ICD-10-CM | POA: Diagnosis not present

## 2022-06-06 DIAGNOSIS — M6281 Muscle weakness (generalized): Secondary | ICD-10-CM | POA: Insufficient documentation

## 2022-06-06 DIAGNOSIS — R262 Difficulty in walking, not elsewhere classified: Secondary | ICD-10-CM | POA: Diagnosis not present

## 2022-06-06 NOTE — Therapy (Signed)
OUTPATIENT PHYSICAL THERAPY TREATMENT NOTE/RECERTIFICATION/Physical Therapy Progress Note   Dates of reporting period  04/13/2022   to   06/06/2022   Patient Name: Paul Hall MRN: [id number] DOB:1995-12-14, 27 y.o., male Today's Date: 06/07/2022  PCP:  Loistine Chance PROVIDER:  Collene Mares, PA-C   PT End of Session - 06/06/22 1600     Visit Number 90    Number of Visits 114    Date for PT Re-Evaluation 08/29/22    Authorization Type [employer] and [employer]- Need auth past 12th visit    Authorization Time Period 01/04/21-03/29/21; Recert [id number]; Recert [id number]; Recert [id number]    Progress Note Due on Visit 100    PT Start Time 1600    PT Stop Time 1644    PT Time Calculation (min) 44 min    Equipment Utilized During Treatment Gait belt   Hoyer lift   Activity Tolerance Patient tolerated treatment well   queasiness/nausea   Behavior During Therapy WFL for tasks assessed/performed                        Past Medical History:  Diagnosis Date   Diabetes mellitus ([place])    Hypertension    Stroke [facility])    Past Surgical History:  Procedure Laterality Date   IVC FILTER PLACEMENT ([place] [facility])     LEG SURGERY     PEG TUBE PLACEMENT     TRACHEOSTOMY     Patient Active Problem List   Diagnosis Date Noted   Sepsis due to vancomycin resistant Enterococcus species ([place] [facility]) 06/06/2019   SIRS (systemic inflammatory response syndrome) ([place]) 06/05/2019   Acute lower UTI 06/05/2019   VRE (vancomycin-resistant Enterococci) infection 06/05/2019   Anemia 06/05/2019   Skin ulcer of sacrum with necrosis of muscle ([place])    Urinary retention    Type 2 diabetes mellitus without complication, with long-term current use of insulin ([facility])    Tachycardia    Lower extremity edema    Acute metabolic encephalopathy    Obstructive sleep apnea    Morbid obesity with BMI of 60.0-69.9, adult ([place])    Goals of care, counseling/discussion     Palliative care encounter    Sepsis ([place]) 04/27/2019   H/O insulin dependent diabetes mellitus 04/27/2019   History of CVA with residual deficit 04/27/2019   Seizure disorder ([place]) 04/27/2019   Decubitus ulcer of sacral region, stage 4 ([place]) 04/27/2019    REFERRING DIAG: Cerebral infarction, unspecified   THERAPY DIAG:  Muscle weakness (generalized) - Plan: PT plan of care cert/re-cert  Other lack of coordination - Plan: PT plan of care cert/re-cert  Difficulty in walking, not elsewhere classified - Plan: PT plan of care cert/re-cert  History of CVA with residual deficit - Plan: PT plan of care cert/re-cert  Unsteadiness on feet - Plan: PT plan of care cert/re-cert  Abnormality of gait and mobility - Plan: PT plan of care cert/re-cert  Other abnormalities of gait and mobility - Plan: PT plan of care cert/re-cert  Rationale for Evaluation and Treatment Rehabilitation  PERTINENT HISTORY: Paul Hall is a 25yoM who presents with severe weakness, quadriparesis, altered sensorium, and visual impairment s/p critical illness and prolonged hospitalization. Pt hospitalized in October 2020 with ARDS 2/2 COVID19 infection. Pt sustained a complex and lengthy hospitalization which included tracheostomy, prolonged sedation, ECMO. In this period pt sustained CVA and SDH. Pt has now been liberated from tracheostomy and G-tube. Pt has since been hospitalized for wound infection and  UTI. Pt lives with parents at home, has hospital bed and left chair, hoyer lift transfers, and power WC for mobility needs. Pt needs heavy physical assistance with ADL 2/2 BUE contractures and motor dysfunction   PRECAUTIONS: Fall  SUBJECTIVE: Patient reports feeling better this week without any residual right leg pain. Agreeable to try to stand or do anything required for recert visit.    Patient reports PAIN:  Are you having pain? No   TODAY'S TREATMENT:    Therapeutic activities:  Reassessed goals for recert-  Please refer to goals for specifics     PATIENT EDUCATION: Education details: standing posture and exercise technique Person educated: Patient Education method: Explanation, Demonstration, Tactile cues, and Verbal cues Education comprehension: verbalized understanding, returned demonstration, verbal cues required, tactile cues required, and needs further education   HOME EXERCISE PROGRAM:  Access Code: [email or web address] URL: [email or web address] Date: 03/23/2022 Prepared by: Sande Brothers  Exercises - Supine Bridge  - 3 x weekly - 3 sets - 10 reps - 2 hold - Supine Gluteal Sets  - 3 x weekly - 3 sets - 10 reps - 5 sec hold - Supine Quad Set  - 1 x daily - 3 x weekly - 3 sets - 10 reps - 5 hold - Seated Long Arc Quad  - 1 x daily - 7 x weekly - 3 sets - 10 reps - Seated Hip Adduction Squeeze with Ball  - 1 x daily - 3 x weekly - 3 sets - 10 reps - 5 hold - Seated Hip Abduction  - 1 x daily - 3 x weekly - 3 sets - 10 reps - 2 hold    PT Short Term Goals -       PT SHORT TERM GOAL #1   Title Pt will be independent with HEP in order to improve strength and balance in order to decrease fall risk and improve function at home and work.    Baseline 01/04/2021= No formal HEP in place; 12/12 no HEP in place; 05/10/2021-Patient and his father were able to report compliance with curent HEP consisting of mostly seated/reclined LE strengthening. Both verbalize no questions at this time.    Time 6    Period Weeks    Status Achieved    Target Date 02/15/21              PT Long Term Goals - Date for completion: 06/01/2022         PT LONG TERM GOAL #1   Title Patient will increase BLE gross strength by 1/2 muscle grade to improve functional strength for improved independence with potential gait, increased standing tolerance and increased ADL ability.    Baseline 01/04/2021- Patient presents with 1/5 to 3-/5 B LE strength with MMT; 12/12: goal partially met for Left knee/hip;  05/10/2021= 2-/5 bilateal Hip flex; 3+/5 bilateral Knee ext; 06/21/2021= Patient presents with 2-/5 bilateral Hip flex; 3+/5 bilateral knee ext/flex; 2-/5 left ankle DF; 0/5 right ankle- and able to increase reps and resistance with LE's. 09/15/2021- Patient technically presents with 2-/5 B hip flex/abd/add - but he is able to raise his hip up to approx 100 deg which has improved. 3+/5 Bilateral knee ext, 2-/5 left ankle and 0/5 right ankle.  12/08/2021= Patient able to lift left knee at 110 deg of hip flex; presents with 3+/5 knee ext, 2-/5 left ankle DF and 0/5 right ankle DF, 2-/5 bilateral Hip abd in seated position.   12/6: R: knee 3+/5 ext, 2/5 flexion, left  knee 3+/5 extension, 3+/5 flexion, R hip: 2+/5 hip add, 2+/5 hip ABD L hip: 4-/5 hip ABD, 3+/5 hip ADD, 3+/5 hip flexion; 06/06/2022= Patient now presents with 2-/5 right ankle DF/PF   Time 12    Period Weeks    Status MET   Target Date 03/07/2022     PT LONG TERM GOAL #2   Title Patient will tolerate sitting unsupported demonstrating erect sitting posture for 15 minutes with CGA to demonstrate improved back extensor strength and improved sitting tolerance.    Baseline 01/04/2021- Patient confied to sitting in lift chair or electric power chair with back support and unable to sit upright without physical assistance; 12/12: tolerates <1 minutes upright unsupported sitting. 05/10/2021=static sit with forward trunk lean  in his power wheelchair without back support x approx 3 min. 06/21/2021=Unable to assess today due to patient with acute back pain but on previous visit able to sit x 8 min without back support. 09/15/2021- on last visit- 09/13/2021- patient was able to sit unsupported x 8 min at edge of mat. 10/13/2023 - Patient was able to sit at edge of mat with varying level of assist today from SBA to min A for a total of 20 min. 12/13/2021= Patient demonstrated unsupported sitting at edge of mat for approx 20 min   Time 12    Period Weeks    Status GOAL  MET   Target Date 12/08/21      PT LONG TERM GOAL #3   Title Patient will demonstrate ability to perform static standing in // bars > 2 min with Max Assist  without loss of balance and fair posture for improved overall strength for pre-gait and transfer activities.    Baseline 01/04/2021= Patient current uanble to stand- Dependent on hoyer or sit to stand lift for transfers. 05/10/2021=Not appropriate yet- Currently still dependent with all transfers using hoyer. 06/21/2021= Patient continuing now to focus on LE strengthening to prepare for standing-unable to try today due to acute low back pain-  planning on attempting in new cert period. 09/15/2021- Patient has attempted standing 2x in past two week- max Assist of 2 people - only once was he successful to clearing his bottom from chair - Will continue to be a focus during the new certification. 12/13/2021= Patient has been limited secondary to increased overall low back pain during this certification and will require more time to focus on this goal.  12/6: not assessed this date, will assess at date when 2-3 PTs are present for assistance    Time 12    Period Weeks    Status GOAL not appropriate at this time - may attempt in future once Patient presents with improved overall LE strength.          PT LONG TERM GOAL #4   Title Pt will improve FOTO score by 10 points or more demonstrating improved perceived functional ability    Baseline FOTO 7 on 10/17; 03/15/21: FOTO 12; 05/10/2021 06/21/2021= 1; 09/15/2021= 9; 12/13/2021= Will issue next visit 12/6: 4; 06/06/2022= Will assess next visit   Time 12    Period Weeks    Status ONGOING   Target date 08/29/2022     PT LONG TERM GOAL #5   Title Patient will perform sit to stand transfer with appropriate AD and max assist of 2 people with 75% consistency to prepare for pregait activities.    Baseline 09/15/2021= Patient unable to stand well- unable to clear his bottom off chair with Max assist  of 2 persons.  12/13/2021- Goal not appropriate to try yet but will keep and roll over to next cert as shift continues to focus on transfers/standing   Time 12    Period Weeks    Status Goal still not appropriate for now but will keep active for future   Target date    PT LONG TERM GOAL #6  Title Patient will tolerate sitting unsupported demonstrating erect sitting posture for 30 minutes with CGA to demonstrate improved back extensor strength and improved sitting tolerance.   Baseline 12/13/2021= Patient demonstrated unsupported sitting at edge of mat for approx 20 min;  12/29/2021- Patient performed approx 30 min of dynamic sitting activities today. 06/06/2022= Patient demonstrated ability to sit and perform static and dynamic UE/LE movement with only Supervision.   Time 12   Period Weeks   Status GOAL MET         7.  Patient will tolerate 5 minutes or more of standing in sit to stand lift in order to indicate improved lower extremity weightbearing tolerance for progression to standing in parallel bars. Baseline: 1 minute on most recent stand 02/21/22; 06/06/2022- Patient did attempt today after complaining of right LE pain last week. He attempted 3 stands using sit to stand lift. - all over 1 min- last one approx 13mn 48 sec- stopped due to fatigue. More erect standing in lift today - still poor gluteal strength but able to activate glutes and extend hips upon command but unable to hold > 5 sec.  Goal status: ONGOING- Progressing Target date: 08/29/2022  8. Patient will tolerate sitting unsupported demonstrating ability to perform dynamic UE/LE activities for 30 minutes  independently to demonstrate ability to sit at edge of bed to eat or perform some ADL's/exercise for optimal quality of life.  Baseline: 06/06/2022- Patient able to sit and perform some UE/LE exercises but requires CGA at times for safety- he is able to static sit for 30 min with supervision.   Goal Status: NEW  Target date: 08/29/2022        Plan     Clinical Impression Statement Patient presents with good motivation and able to demo progress with goals. He was able to sit static for 30 min with only supervision which is a progression from requiring CGA. He also is demonstrating some improvement with standing endurance using the sit to stand lift. He was able to engage his glutes and the plan will be to increase static standing at home using his lift and possibly try some standing in // bars in new cert period if patient able to continues with strength gains. Patient's condition has the potential to improve in response to therapy. Maximum improvement is yet to be obtained. The anticipated improvement is attainable and reasonable in a generally predictable time.   Pt will continue to benefit from skilled physical therapy intervention to address impairments, improve QOL, and attain therapy goals.    Personal Factors and Comorbidities Comorbidity 3+;Time since onset of injury/illness/exacerbation    Comorbidities CVA, diabetes, Seizures    Examination-Activity Limitations Bathing;Bed Mobility;Bend;Caring for Others;Carry;Dressing;Hygiene/Grooming;Lift;Locomotion Level;Reach Overhead;Self Feeding;Sit;Squat;Stairs;Stand;Transfers;Toileting    Examination-Participation Restrictions Cleaning;Community Activity;Driving;Laundry;Medication Management;Meal Prep;Occupation;Personal Finances;Shop;Yard Work;Volunteer    Stability/Clinical Decision Making Evolving/Moderate complexity    Rehab Potential Fair    PT Frequency 2x / week    PT Duration 12 weeks    PT Treatment/Interventions ADLs/Self Care Home Management;Cryotherapy;Electrical Stimulation;Moist Heat;Ultrasound;DME Instruction;Gait training;Stair training;Functional mobility training;Therapeutic exercise;Balance training;Patient/family education;Orthotic Fit/Training;Neuromuscular re-education;Wheelchair mobility training;Manual techniques;Passive range of motion;Dry needling;Energy  conservation;Taping;Visual/perceptual remediation/compensation;Joint Manipulations    PT Next Visit Plan core strength- sitting at EOM, postural control, sit to stand if able; Continue with progressive LE Strengthening.    PT Home Exercise Plan No changes to HEP today    Consulted and Agree with Plan of Care Patient;Family member/caregiver    Family Member Consulted Mom             Ollen Bowl, PT Physical Therapist- [facility]  06/07/2022, 4:58 PM

## 2022-06-06 NOTE — Therapy (Incomplete)
Occupational Therapy Treatment Note  Patient Name: Paul Hall MRN: [id number] DOB:17-Dec-1995, 27 y.o., male Today's Date: 06/07/2022  PCP: Dr. Gerarda Fraction REFERRING PROVIDER: Dr. Gerarda Fraction   OT End of Session - 06/06/22 2202     Visit Number 22    Number of Visits 61    Date for OT Re-Evaluation 08/17/22    Authorization Type Progress report period starting 10/06/2021    OT Start Time 1651    OT Stop Time 1730    OT Time Calculation (min) 39 min    Equipment Utilized During Treatment tilt in space power wc    Activity Tolerance Patient tolerated treatment well    Behavior During Therapy WFL for tasks assessed/performed                     Past Medical History:  Diagnosis Date   Diabetes mellitus ([place])    Hypertension    Stroke ([place])    Past Surgical History:  Procedure Laterality Date   IVC FILTER PLACEMENT ([facility])     LEG SURGERY     PEG TUBE PLACEMENT     TRACHEOSTOMY     Patient Active Problem List   Diagnosis Date Noted   Sepsis due to vancomycin resistant Enterococcus species ([place]) 06/06/2019   SIRS (systemic inflammatory response syndrome) ([place]) 06/05/2019   Acute lower UTI 06/05/2019   VRE (vancomycin-resistant Enterococci) infection 06/05/2019   Anemia 06/05/2019   Skin ulcer of sacrum with necrosis of muscle ([place])    Urinary retention    Type 2 diabetes mellitus without complication, with long-term current use of insulin ([facility])    Tachycardia    Lower extremity edema    Acute metabolic encephalopathy    Obstructive sleep apnea    Morbid obesity with BMI of 60.0-69.9, adult ([place])    Goals of care, counseling/discussion    Palliative care encounter    Sepsis ([place]) 04/27/2019   H/O insulin dependent diabetes mellitus 04/27/2019   History of CVA with residual deficit 04/27/2019   Seizure disorder ([place]) 04/27/2019   Decubitus ulcer of sacral region, stage 4 ([place]) 04/27/2019    ONSET DATE: 01/2019  REFERRING DIAG: CVA/COVID-19  THERAPY DIAG:   Muscle weakness (generalized)  Rationale for Evaluation and Treatment Rehabilitation  SUBJECTIVE:   SUBJECTIVE STATEMENT:  Pt. reports having had Botox injections on Friday, and has been sore this past weekend. Pt. Reports spending time visiting with a good friend this past weekend.  Pt accompanied by: self and family member  PERTINENT HISTORY:  Pt. is a 27 y.o. male who was diagnosed with COVID-19, and CVA with resultant quadriplegia. Pt. Was then hospitalized with VRE UTI. PMHx includes: urinary retention, seizure disorder, obstructive sleep disorder, DM Type II, Morbid obesity.   PRECAUTIONS: None  WEIGHT BEARING RESTRICTIONS No  PAIN:  Are you having pain? No-soreness in right forearm from recent Botox.  FALLS: Has patient fallen in last 6 months? No  LIVING ENVIRONMENT: Lives with: lives with their family Lives in: House/apartment Stairs: No Level Entry Has following equipment at home: Wheelchair (power) and hoyer lift, sit to stand lift  PLOF: Independent  PATIENT GOALS: To be able to engage in more daily care tasks.  OBJECTIVE:   HAND DOMINANCE: Right  ADLs:  Transfers/ambulation related to ADLs: Eating: Pt. reports being able to hold standard utensils, and is starting to engage more in self-feeding tasks, hand to mouth patterns. Pt. Reports that he does as much as he can with the  task, and family assists  with the remainder of the task. Grooming: Pt. Is able to initiate holding an electric toothbrush, and brush his teeth. Family assists LB Dressing: Total Assist UE dressing: Pt. is now able to reach up to actively assist with grasping , and pulling his gown down. Toileting:  Total Assist Bathing: MaxA UB, Total assist LB Tub Shower transfers: N/A Equipment: See above    IADLs: Shopping: Relies on family to assist Light housekeeping: Total Assist Meal Prep: Total Assist Community mobility:   Medication management:  Total Assist  Financial management:  N/A Handwriting: Not legible: Pt. Is able to hold a pen with the left hand, and initiate marking the page. Pt.'s eye glasses were not available  MOBILITY STATUS:  Power w/c  POSTURE COMMENTS:  Pt. Requires position changes in his power w/c  ACTIVITY TOLERANCE: Activity tolerance:  Fair  FUNCTIONAL OUTCOME MEASURES: FOTO: 40  TR score: 45  05/25/2022:   FOTO: 50 TR score: 45   UPPER EXTREMITY ROM     Active ROM Right eval Right 03/21/2022 Right 05/25/2022  Left eval Left  03/21/2022 Left 05/25/2022  Shoulder flexion 106 scaption 105  Scaption 62 flexion 110 scaption  119 123 125  Shoulder abduction 114 90 97  110 115 115  Shoulder adduction         Shoulder extension         Shoulder internal rotation         Shoulder external rotation         Elbow flexion 120(130) 145 145  135(135) 145 145  Elbow extension -45(-35) -22(-35) -28(-22)  -27(-18) -21(-20) -20(-16)  Wrist flexion         Wrist extension -30(10) -25(10) -10(30)  10(50) 19(50) 28(50)  Wrist ulnar deviation         Wrist radial deviation         Wrist pronation         Wrist supination   Limited by flexor tone    Limited by flexor tone  (Blank rows = not tested)      UPPER EXTREMITY MMT:     Right eval Right 03/21/2022 Right  05/25/2022  Left eval Left 03/21/2022 Left  05/25/2022  Shoulder flexion 3-/5 scaption 3-/5 3-/5  3-/5 3/5 3+/5  Shoulder abduction 3-/5 3-/5 3-/5  3-/5 3-/5 4-/5  Shoulder adduction         Shoulder extension         Shoulder internal rotation         Shoulder external rotation         Elbow flexion 3/5 3/5 3+/5  3/5 3+/5 4-/5  Elbow extension 2-/5 2/5 3-/5  2-/5 2/5   Wrist flexion         Wrist extension 2-/5 2-/5 2/5  2/5 2+/5 3-/5  Wrist ulnar deviation         Wrist radial deviation         Wrist pronation         Wrist supination         (Blank rows = not tested)     HAND FUNCTION:  Grip strength: Right: 0 lbs; Left: 0 lbs and Lateral pinch: Right: 5 lbs,  Left: 2 lbs  03/21/2022: Lateral pinch: Right: 3.5 lbs, Left: 2 lbs  Bilateral digit PIP/DIP flexion contractures with MP hyperextension with attempts for AROM. Pt. is able to tolerate AROM to the bilateral digits at the initial evaluation however, has a history  of pain in the digits.  COORDINATION:  Eval: Pt. is unable to grasp 9-hole test pegs. Pt. is able to initiate grasping larger pegs, and is able to hold a pen in the left hand.  SENSATION: Light touch: Impaired   EDEMA:  N/A  MUSCLE TONE: BUE flexor Spasticity  COGNITIO Overall cognitive status: Continue to assess in functional context  VISION:   Subjective report: Pt. was not wearing glasses at the time of the initial eval.  Baseline vision: Vision is very limited. Wears glasses all the time Visual history: History of impaired vision following CVA. Pt. Has received treatment through the [facility] low vision rehabilitation program.   VISION ASSESSMENT: Impaired To be further assessed in functional context  PERCEPTION: Impaired   PRAXIS: Impaired: motor planning  OBSERVATIONS:  Pt reports being on Tramadol   TODAY'S TREATMENT:    There. Ex.:  Pt. worked on left grip strengthening with reps of gross gripping in multiple forearm positions using 1 yellow resistance band. Pt. was able to align his fingers with assist. External support was required at the base of the gripper. Pt. tolerated PROM/AAROM to the BUE shoulder, elbow, wrist, and digits following recent Botox injections.     PATIENT EDUCATION: Education details: There. Margorie John Person educated: Patient and Parent Education method: Explanation, Demonstration, Tactile cues, and Verbal cues Education comprehension: verbalized understanding, returned demonstration, verbal cues required, and needs further education   HOME EXERCISE PROGRAM:   Continue ongoing assessment, and continue to provide as needed.     GOALS: Goals reviewed with patient?  Yes  SHORT TERM GOALS: Target date:05/03/2022     To assess splint fit, and make appropriate adjustments to promote good skin integrity through the palmar surface of the bilateral hands.  Baseline: 05/25/22: Goal currently met, however ongoing as needs to assess splint fit arise. 03/23/2022: Pt. is wearing splints a couple of hours at night bilateral resting hand splints. 03/21/2022: Pt. is wearing splints a couple of hours at night bilateral resting hand splints. Goal status: Continue ongoing assessment as requested, and as needed   LONG TERM GOALS: Target date: 06/13/2022     FOTO score will Improve by 2 points for Pt. perceived improvement with the assessment specific ADL/IADL tasks.  Baseline: 05/25/2022: FOTO score: 50,  TR score: 45 Eval: FOTO score: 40,  TR score: 45 Goal status: Achieved   2.   Pt. will independently perform oral care for 100% of the task after complete set-up. Baseline: 05/25/2022:  Pt. Is able to initiate and perform oral care for approximately 90% of the task. Complete set-up required. Assi needed only for the very back teeth. 03/23/2022: Pt. Is able to initiate and perform oral care for approximately 75% of the task. Pt. Requires assist at proximally at the elbow for through oral care. 03/21/2022: Pt. Is able to initiate and perform oral care for approximately 75% of the task. Pt. Requires assist at proximally at the elbow for through oral care. Eval: Pt. is able to initiate using an electric toothbrush. Pt. requires assist for set-up, and assist for thoroughness, and as he Pt. fatigues. Goal status: Ongoing  3.  Pt. Will be independent with self-feeding for 100% of the meal after complete set-up Baseline: 05/25/2022: Pt. Is able to use a spoon to scoop cereal when feeding himself cereal 85% of the time. Pt. Is able to feed himself snack/finger foods 100% of the time. Pt. Continues to work on consistency of  stabilizing a cup/mug when drinking. Pt.  Is able to grasp a  water bottle with assist initially, with assist tapering off as he drinks.03/23/2022: Pt. Is able to perform scooping cereal for 75% of the time. Pt. required assist, and support at the left elbow, and Pt. Presents with limited forearm supination when using the spoon, and bringing it towards his mouth. Pt. Is able to use a fork to spear items, and perform the hand to mouth pattern.  03/21/2022: Pt. Is able to perform scooping cereal for 75% of the time. Pt. required assist, and support at the left elbow, and Pt. presents with limited forearm supination when using the spoon, and bringing it towards his mouth. Pt. Is able to use a fork to spear items, and perform the hand to mouth pattern.  Eval: Pt. is able to hold standard standard utensils. Pt. Performs as much of the task as he, can and has assistance for the remainder. Goal status: Ongoing  4.  Pt. Will improve grasp patterns and consistently grasp 1/2" objects for ADL, and IADL tasks.  Baseline:05/25/2022: Pt. Is working on improving consistency of grasping 1/2" objects with visual cues.  03/23/2022: Pt. Is able to grasp 1" objects consistently,and continues to work on the hand patterns needed to grasp 1/2" objects.03/21/2022: Pt. Is able to grasp 1" objects consistently,and continues to work on the hand patterns needed to grasp 1/2" objects. Eval: Pt. is able to grasp 1" objects intermittently using a lateral grasp pattern. Goal status: Ongoing  5.  Pt. will independently write his name legibly with letter sizes under 2". Baseline: 05/25/2022: Pt. to continue to work towards formulating grasp patterns in preparation for grasping a large width pen. Pt. Requires visual cues. 03/23/2022: Pt. Is able to write his name with modA, however has difficulty with formulating letter sizes less than 2" in size with 50% legibility for the 3 letters of his name.03/21/2022: Pt. Is able to write his name with modA, however has difficulty with formulating letter sizes less  than 2" in size with 50% legibility for the 3 letters of his name. Eval: Pt is able to hold a thin marker with his left hand, and formulate a line, and initiate a circular pattern (Pt. without glasses today) Goal status: Ongoing  6. Pt. Will reach up to comb/brush his hair  with minA.  Baseline: 05/25/2022: Pt. Is able to reach up with the left hand to the left side, top, and back of his head. 03/23/2022: Pt. is now able to more consistently initiate reaching up to his head with his left hand in preparation for haircare12/18/2023: Pt. is now able to more consistently initiate reaching up to his head with his left hand in preparation for haircare. Eval: Pt. is able to initiate reaching up for hair care with a long handled brush, however is unable to sustain UE's in elevation to perform the task.     Goal status: Ongoing  7. Pt. Will independently navigate the w/c through his environment with minA with visual scanning, and hand placement on the controls.  Baseline: 05/25/2022: Pt. Requires minA  and  cues to navigate the w/c in wide spaces, and requires Mod cues to navigate the w/c through more narrow doorways, and tighter turns. Pt. Requires max cues for scanning through the environment, and moderate cues for hand placement on the controls.     Goal status:Ongoing       ASSESSMENT:  CLINICAL IMPRESSION:  Pt. tolerated  ROM well, however has right forearm soreness from recent Botox injections. Pt.  reports that he has been sore, and had limited ROM over the weekend. Pt. continues to be able to grasp a water bottle with assist initially for hand placement. Pt. Toerated the left grip strengthening exercises well. Pt. continues to work on the skills needed to reach up further, and sustain UEs in elevation during self-grooming tasks, as well as combinations of movements needed in the elbow forearm, and wrist to stabilize, and support cups firmly when drinking, as well as hold and drink from a water  bottle. Pt. Continues to present with Bilateral forearm, wrist, and digit PIP, and DIP tightness. Pt. continues to work on improving BUE functioning, w/c navigation, and visual compensatory strategies to improve, and maximize independence with ADLs, and IADLs.     PERFORMANCE DEFICITS in functional skills including ADLs, IADLs, coordination, dexterity, proprioception, ROM, strength, pain, [place], GMC, decreased knowledge of use of DME, and UE functional use, cognitive skills including safety awareness, and psychosocial skills including coping strategies, environmental adaptation, habits, and routines and behaviors.   IMPAIRMENTS are limiting patient from ADLs, IADLs, leisure, and social participation.   COMORBIDITIES may have co-morbidities  that affects occupational performance. Patient will benefit from skilled OT to address above impairments and improve overall function.  MODIFICATION OR ASSISTANCE TO COMPLETE EVALUATION: Maximum or significant modification of tasks or assist is necessary to complete an evaluation.  OT OCCUPATIONAL PROFILE AND HISTORY: Comprehensive assessment: Review of records and extensive additional review of physical, cognitive, psychosocial history related to current functional performance.  CLINICAL DECISION MAKING: High - multiple treatment options, significant modification of task necessary  REHAB POTENTIAL: Fair    EVALUATION COMPLEXITY: High    PLAN: OT FREQUENCY: 2 x's a week  OT DURATION:12 weeks  PLANNED INTERVENTIONS: self care/ADL training, therapeutic exercise, therapeutic activity, neuromuscular re-education, manual therapy, passive range of motion, patient/family education, and cognitive remediation/compensation  RECOMMENDED OTHER SERVICES: PT  CONSULTED AND AGREED WITH PLAN OF CARE: Patient and family member/caregiver  PLAN FOR NEXT SESSION: Initiate treatment   Harrel Carina, MS, OTR/L   Harrel Carina, OT 06/07/2022, 8:35  AM

## 2022-06-08 ENCOUNTER — Ambulatory Visit: Payer: BC Managed Care – PPO | Admitting: Occupational Therapy

## 2022-06-08 ENCOUNTER — Ambulatory Visit: Payer: BC Managed Care – PPO

## 2022-06-08 ENCOUNTER — Encounter: Payer: BC Managed Care – PPO | Admitting: Occupational Therapy

## 2022-06-08 DIAGNOSIS — R278 Other lack of coordination: Secondary | ICD-10-CM

## 2022-06-08 DIAGNOSIS — M6281 Muscle weakness (generalized): Secondary | ICD-10-CM

## 2022-06-08 DIAGNOSIS — R262 Difficulty in walking, not elsewhere classified: Secondary | ICD-10-CM

## 2022-06-08 DIAGNOSIS — R2681 Unsteadiness on feet: Secondary | ICD-10-CM

## 2022-06-08 DIAGNOSIS — R2689 Other abnormalities of gait and mobility: Secondary | ICD-10-CM

## 2022-06-08 DIAGNOSIS — R269 Unspecified abnormalities of gait and mobility: Secondary | ICD-10-CM

## 2022-06-08 DIAGNOSIS — I6930 Unspecified sequelae of cerebral infarction: Secondary | ICD-10-CM | POA: Diagnosis not present

## 2022-06-08 NOTE — Therapy (Signed)
OUTPATIENT PHYSICAL THERAPY TREATMENT NOTE  Patient Name: Paul Hall MRN: [id number] DOB:Jan 01, 1996, 27 y.o., male Today's Date: 06/08/2022  PCP:  Loistine Chance PROVIDER:  Collene Mares, PA-C   PT End of Session - 06/08/22 1658     Visit Number 91    Number of Visits 114    Date for PT Re-Evaluation 08/29/22    Authorization Type [employer] and [employer]- Need auth past 12th visit    Authorization Time Period 01/04/21-03/29/21; Recert [id number]; Recert [id number]; Recert [id number]    Progress Note Due on Visit 100    PT Start Time 1600    PT Stop Time 1644    PT Time Calculation (min) 44 min    Equipment Utilized During Treatment Gait belt   Hoyer lift   Activity Tolerance Patient tolerated treatment well   queasiness/nausea   Behavior During Therapy WFL for tasks assessed/performed                        Past Medical History:  Diagnosis Date   Diabetes mellitus ([place])    Hypertension    Stroke [facility])    Past Surgical History:  Procedure Laterality Date   IVC FILTER PLACEMENT ([place] [facility])     LEG SURGERY     PEG TUBE PLACEMENT     TRACHEOSTOMY     Patient Active Problem List   Diagnosis Date Noted   Sepsis due to vancomycin resistant Enterococcus species ([place]) 06/06/2019   SIRS (systemic inflammatory response syndrome) ([place]) 06/05/2019   Acute lower UTI 06/05/2019   VRE (vancomycin-resistant Enterococci) infection 06/05/2019   Anemia 06/05/2019   Skin ulcer of sacrum with necrosis of muscle ([place])    Urinary retention    Type 2 diabetes mellitus without complication, with long-term current use of insulin ([facility])    Tachycardia    Lower extremity edema    Acute metabolic encephalopathy    Obstructive sleep apnea    Morbid obesity with BMI of 60.0-69.9, adult ([place])    Goals of care, counseling/discussion    Palliative care encounter    Sepsis ([place]) 04/27/2019   H/O insulin dependent diabetes mellitus 04/27/2019    History of CVA with residual deficit 04/27/2019   Seizure disorder ([place]) 04/27/2019   Decubitus ulcer of sacral region, stage 4 ([place]) 04/27/2019    REFERRING DIAG: Cerebral infarction, unspecified   THERAPY DIAG:  Muscle weakness (generalized)  Other lack of coordination  Difficulty in walking, not elsewhere classified  History of CVA with residual deficit  Unsteadiness on feet  Abnormality of gait and mobility  Other abnormalities of gait and mobility  Rationale for Evaluation and Treatment Rehabilitation  PERTINENT HISTORY: Paul Hall is a 25yoM who presents with severe weakness, quadriparesis, altered sensorium, and visual impairment s/p critical illness and prolonged hospitalization. Pt hospitalized in October 2020 with ARDS 2/2 COVID19 infection. Pt sustained a complex and lengthy hospitalization which included tracheostomy, prolonged sedation, ECMO. In this period pt sustained CVA and SDH. Pt has now been liberated from tracheostomy and G-tube. Pt has since been hospitalized for wound infection and UTI. Pt lives with parents at home, has hospital bed and left chair, hoyer lift transfers, and power WC for mobility needs. Pt needs heavy physical assistance with ADL 2/2 BUE contractures and motor dysfunction   PRECAUTIONS: Fall  SUBJECTIVE: Patient reports not really sore from last visit including standing- Reports he has been performing his gluteal sets as requested.    Patient reports PAIN:  Are you having pain? No   TODAY'S TREATMENT:    Therapeutic activities:  Dependent transfer via hoyer lift from power w/c to large mat table (with wedge and pillow) - Patient then assist to sitting at edge of mat with assist of mother.     Once in sitting - patient performed static sit for several min and then the following dynamic UE/LE activities while sitting at EOM.   THEREX:  Ankle- seated attempted calf raises- some active movement yet unable to functionally lift heels  off floor. Ankle - seated DF- some activation observed with shoes moving yet unable clear toe box off floor.  UE- Shoulder flex (right arm contracted) but attempting to raise arm as high as possible x 15 reps Active knee flex/ext- x 20 reps - able to clear the floor and good slow motion Lateral UE shift - leaning trying to place left hand flat on table and right fist x 15  reps each  Seated hip march - AROM- 25 reps each LE Chest press into scap row- active x 20 reps each LE  Patient reported fatigue as limiting factor today- Positioned from EOM back to [facility] via [name] and assist of mother.    PATIENT EDUCATION: Education details: standing posture and exercise technique Person educated: Patient Education method: Explanation, Demonstration, Tactile cues, and Verbal cues Education comprehension: verbalized understanding, returned demonstration, verbal cues required, tactile cues required, and needs further education   HOME EXERCISE PROGRAM:  Access Code: [email or web address] URL: [email or web address] Date: 03/23/2022 Prepared by: Sande Brothers  Exercises - Supine Bridge  - 3 x weekly - 3 sets - 10 reps - 2 hold - Supine Gluteal Sets  - 3 x weekly - 3 sets - 10 reps - 5 sec hold - Supine Quad Set  - 1 x daily - 3 x weekly - 3 sets - 10 reps - 5 hold - Seated Long Arc Quad  - 1 x daily - 7 x weekly - 3 sets - 10 reps - Seated Hip Adduction Squeeze with Ball  - 1 x daily - 3 x weekly - 3 sets - 10 reps - 5 hold - Seated Hip Abduction  - 1 x daily - 3 x weekly - 3 sets - 10 reps - 2 hold    PT Short Term Goals -       PT SHORT TERM GOAL #1   Title Pt will be independent with HEP in order to improve strength and balance in order to decrease fall risk and improve function at home and work.    Baseline 01/04/2021= No formal HEP in place; 12/12 no HEP in place; 05/10/2021-Patient and his father were able to report compliance with curent HEP consisting of mostly seated/reclined LE  strengthening. Both verbalize no questions at this time.    Time 6    Period Weeks    Status Achieved    Target Date 02/15/21              PT Long Term Goals - Date for completion: 06/01/2022         PT LONG TERM GOAL #1   Title Patient will increase BLE gross strength by 1/2 muscle grade to improve functional strength for improved independence with potential gait, increased standing tolerance and increased ADL ability.    Baseline 01/04/2021- Patient presents with 1/5 to 3-/5 B LE strength with MMT; 12/12: goal partially met for Left knee/hip; 05/10/2021= 2-/5 bilateal Hip flex; 3+/5 bilateral Knee ext; 06/21/2021= Patient presents  with 2-/5 bilateral Hip flex; 3+/5 bilateral knee ext/flex; 2-/5 left ankle DF; 0/5 right ankle- and able to increase reps and resistance with LE's. 09/15/2021- Patient technically presents with 2-/5 B hip flex/abd/add - but he is able to raise his hip up to approx 100 deg which has improved. 3+/5 Bilateral knee ext, 2-/5 left ankle and 0/5 right ankle.  12/08/2021= Patient able to lift left knee at 110 deg of hip flex; presents with 3+/5 knee ext, 2-/5 left ankle DF and 0/5 right ankle DF, 2-/5 bilateral Hip abd in seated position.   12/6: R: knee 3+/5 ext, 2/5 flexion, left knee 3+/5 extension, 3+/5 flexion, R hip: 2+/5 hip add, 2+/5 hip ABD L hip: 4-/5 hip ABD, 3+/5 hip ADD, 3+/5 hip flexion; 06/06/2022= Patient now presents with 2-/5 right ankle DF/PF   Time 12    Period Weeks    Status MET   Target Date 03/07/2022     PT LONG TERM GOAL #2   Title Patient will tolerate sitting unsupported demonstrating erect sitting posture for 15 minutes with CGA to demonstrate improved back extensor strength and improved sitting tolerance.    Baseline 01/04/2021- Patient confied to sitting in lift chair or electric power chair with back support and unable to sit upright without physical assistance; 12/12: tolerates <1 minutes upright unsupported sitting. 05/10/2021=static sit with  forward trunk lean  in his power wheelchair without back support x approx 3 min. 06/21/2021=Unable to assess today due to patient with acute back pain but on previous visit able to sit x 8 min without back support. 09/15/2021- on last visit- 09/13/2021- patient was able to sit unsupported x 8 min at edge of mat. 10/13/2023 - Patient was able to sit at edge of mat with varying level of assist today from SBA to min A for a total of 20 min. 12/13/2021= Patient demonstrated unsupported sitting at edge of mat for approx 20 min   Time 12    Period Weeks    Status GOAL MET   Target Date 12/08/21      PT LONG TERM GOAL #3   Title Patient will demonstrate ability to perform static standing in // bars > 2 min with Max Assist  without loss of balance and fair posture for improved overall strength for pre-gait and transfer activities.    Baseline 01/04/2021= Patient current uanble to stand- Dependent on hoyer or sit to stand lift for transfers. 05/10/2021=Not appropriate yet- Currently still dependent with all transfers using hoyer. 06/21/2021= Patient continuing now to focus on LE strengthening to prepare for standing-unable to try today due to acute low back pain-  planning on attempting in new cert period. 09/15/2021- Patient has attempted standing 2x in past two week- max Assist of 2 people - only once was he successful to clearing his bottom from chair - Will continue to be a focus during the new certification. 12/13/2021= Patient has been limited secondary to increased overall low back pain during this certification and will require more time to focus on this goal.  12/6: not assessed this date, will assess at date when 2-3 PTs are present for assistance    Time 12    Period Weeks    Status GOAL not appropriate at this time - may attempt in future once Patient presents with improved overall LE strength.          PT LONG TERM GOAL #4   Title Pt will improve FOTO score by 10 points or more demonstrating  improved  perceived functional ability    Baseline FOTO 7 on 10/17; 03/15/21: FOTO 12; 05/10/2021 06/21/2021= 1; 09/15/2021= 9; 12/13/2021= Will issue next visit 12/6: 4; 06/06/2022= Will assess next visit   Time 12    Period Weeks    Status ONGOING   Target date 08/29/2022     PT LONG TERM GOAL #5   Title Patient will perform sit to stand transfer with appropriate AD and max assist of 2 people with 75% consistency to prepare for pregait activities.    Baseline 09/15/2021= Patient unable to stand well- unable to clear his bottom off chair with Max assist of 2 persons. 12/13/2021- Goal not appropriate to try yet but will keep and roll over to next cert as shift continues to focus on transfers/standing   Time 12    Period Weeks    Status Goal still not appropriate for now but will keep active for future   Target date    PT LONG TERM GOAL #6  Title Patient will tolerate sitting unsupported demonstrating erect sitting posture for 30 minutes with CGA to demonstrate improved back extensor strength and improved sitting tolerance.   Baseline 12/13/2021= Patient demonstrated unsupported sitting at edge of mat for approx 20 min;  12/29/2021- Patient performed approx 30 min of dynamic sitting activities today. 06/06/2022= Patient demonstrated ability to sit and perform static and dynamic UE/LE movement with only Supervision.   Time 12   Period Weeks   Status GOAL MET         7.  Patient will tolerate 5 minutes or more of standing in sit to stand lift in order to indicate improved lower extremity weightbearing tolerance for progression to standing in parallel bars. Baseline: 1 minute on most recent stand 02/21/22; 06/06/2022- Patient did attempt today after complaining of right LE pain last week. He attempted 3 stands using sit to stand lift. - all over 1 min- last one approx 92mn 48 sec- stopped due to fatigue. More erect standing in lift today - still poor gluteal strength but able to activate glutes and extend hips upon  command but unable to hold > 5 sec.  Goal status: ONGOING- Progressing Target date: 08/29/2022  8. Patient will tolerate sitting unsupported demonstrating ability to perform dynamic UE/LE activities for 30 minutes  independently to demonstrate ability to sit at edge of bed to eat or perform some ADL's/exercise for optimal quality of life.  Baseline: 06/06/2022- Patient able to sit and perform some UE/LE exercises but requires CGA at times for safety- he is able to static sit for 30 min with supervision.   Goal Status: NEW  Target date: 08/29/2022       Plan     Clinical Impression Statement Patient presents with good motivation today and focusing on new goal of sitting performing dynamic activities. He performed very well- SBA and mostly active movement- minimal activation with more distal movements yet [facility].  Pt will continue to benefit from skilled physical therapy intervention to address impairments, improve QOL, and attain therapy goals.    Personal Factors and Comorbidities Comorbidity 3+;Time since onset of injury/illness/exacerbation    Comorbidities CVA, diabetes, Seizures    Examination-Activity Limitations Bathing;Bed Mobility;Bend;Caring for Others;Carry;Dressing;Hygiene/Grooming;Lift;Locomotion Level;Reach Overhead;Self Feeding;Sit;Squat;Stairs;Stand;Transfers;Toileting    Examination-Participation Restrictions Cleaning;Community Activity;Driving;Laundry;Medication Management;Meal Prep;Occupation;Personal Finances;Shop;Yard Work;Volunteer    Stability/Clinical Decision Making Evolving/Moderate complexity    Rehab Potential Fair    PT Frequency 2x / week    PT  Duration 12 weeks    PT Treatment/Interventions ADLs/Self Care Home Management;Cryotherapy;Electrical Stimulation;Moist Heat;Ultrasound;DME Instruction;Gait training;Stair training;Functional mobility training;Therapeutic exercise;Balance training;Patient/family  education;Orthotic Fit/Training;Neuromuscular re-education;Wheelchair mobility training;Manual techniques;Passive range of motion;Dry needling;Energy conservation;Taping;Visual/perceptual remediation/compensation;Joint Manipulations    PT Next Visit Plan core strength- sitting at EOM, postural control, sit to stand if able; Continue with progressive LE Strengthening.    PT Home Exercise Plan No changes to HEP today    Consulted and Agree with Plan of Care Patient;Family member/caregiver    Family Member Consulted Mom             Ollen Bowl, PT Physical Therapist- [facility]  06/08/2022, 5:19 PM

## 2022-06-09 DIAGNOSIS — G4733 Obstructive sleep apnea (adult) (pediatric): Secondary | ICD-10-CM | POA: Diagnosis not present

## 2022-06-09 NOTE — Therapy (Signed)
Occupational Therapy Treatment Note  Patient Name: Paul Hall MRN: [id number] DOB:10-May-1995, 27 y.o., male Today's Date: 06/09/2022  PCP: Dr. Gerarda Fraction REFERRING PROVIDER: Dr. Gerarda Fraction   OT End of Session - 06/09/22 0908     Visit Number 23    Number of Visits 36    Date for OT Re-Evaluation 08/17/22    OT Start Time 1648    OT Stop Time 1730    OT Time Calculation (min) 42 min    Activity Tolerance Patient tolerated treatment well    Behavior During Therapy WFL for tasks assessed/performed                     Past Medical History:  Diagnosis Date   Diabetes mellitus ([place])    Hypertension    Stroke [facility])    Past Surgical History:  Procedure Laterality Date   IVC FILTER PLACEMENT ([place] [facility])     LEG SURGERY     PEG TUBE PLACEMENT     TRACHEOSTOMY     Patient Active Problem List   Diagnosis Date Noted   Sepsis due to vancomycin resistant Enterococcus species ([place]) 06/06/2019   SIRS (systemic inflammatory response syndrome) ([place]) 06/05/2019   Acute lower UTI 06/05/2019   VRE (vancomycin-resistant Enterococci) infection 06/05/2019   Anemia 06/05/2019   Skin ulcer of sacrum with necrosis of muscle ([place])    Urinary retention    Type 2 diabetes mellitus without complication, with long-term current use of insulin ([facility])    Tachycardia    Lower extremity edema    Acute metabolic encephalopathy    Obstructive sleep apnea    Morbid obesity with BMI of 60.0-69.9, adult ([place])    Goals of care, counseling/discussion    Palliative care encounter    Sepsis ([place]) 04/27/2019   H/O insulin dependent diabetes mellitus 04/27/2019   History of CVA with residual deficit 04/27/2019   Seizure disorder ([place]) 04/27/2019   Decubitus ulcer of sacral region, stage 4 ([place]) 04/27/2019    ONSET DATE: 01/2019  REFERRING DIAG: CVA/COVID-19  THERAPY DIAG:  Muscle weakness (generalized)  Other lack of coordination  Rationale for Evaluation and Treatment  Rehabilitation  SUBJECTIVE:   SUBJECTIVE STATEMENT:  Pt. Reports that he fed himself approximately 97% of his meal himself yesterday.  Pt accompanied by: self and family member  PERTINENT HISTORY:  Pt. is a 27 y.o. male who was diagnosed with COVID-19, and CVA with resultant quadriplegia. Pt. Was then hospitalized with VRE UTI. PMHx includes: urinary retention, seizure disorder, obstructive sleep disorder, DM Type II, Morbid obesity.   PRECAUTIONS: None  WEIGHT BEARING RESTRICTIONS No  PAIN:  Are you having pain? No-soreness in right forearm from recent Botox.  FALLS: Has patient fallen in last 6 months? No  LIVING ENVIRONMENT: Lives with: lives with their family Lives in: House/apartment Stairs: No Level Entry Has following equipment at home: Wheelchair (power) and hoyer lift, sit to stand lift  PLOF: Independent  PATIENT GOALS: To be able to engage in more daily care tasks.  OBJECTIVE:   HAND DOMINANCE: Right  ADLs:  Transfers/ambulation related to ADLs: Eating: Pt. reports being able to hold standard utensils, and is starting to engage more in self-feeding tasks, hand to mouth patterns. Pt. Reports that he does as much as he can with the task, and family assists  with the remainder of the task. Grooming: Pt. Is able to initiate holding an electric toothbrush, and brush his teeth. Family assists LB Dressing: Total Assist  UE dressing: Pt. is now able to reach up to actively assist with grasping , and pulling his gown down. Toileting:  Total Assist Bathing: MaxA UB, Total assist LB Tub Shower transfers: N/A Equipment: See above    IADLs: Shopping: Relies on family to assist Light housekeeping: Total Assist Meal Prep: Total Assist Community mobility:   Medication management:  Total Assist  Financial management: N/A Handwriting: Not legible: Pt. Is able to hold a pen with the left hand, and initiate marking the page. Pt.'s eye glasses were not  available  MOBILITY STATUS:  Power w/c  POSTURE COMMENTS:  Pt. Requires position changes in his power w/c  ACTIVITY TOLERANCE: Activity tolerance:  Fair  FUNCTIONAL OUTCOME MEASURES: FOTO: 40  TR score: 45  05/25/2022:   FOTO: 50 TR score: 45   UPPER EXTREMITY ROM     Active ROM Right eval Right 03/21/2022 Right 05/25/2022  Left eval Left  03/21/2022 Left 05/25/2022  Shoulder flexion 106 scaption 105  Scaption 62 flexion 110 scaption  119 123 125  Shoulder abduction 114 90 97  110 115 115  Shoulder adduction         Shoulder extension         Shoulder internal rotation         Shoulder external rotation         Elbow flexion 120(130) 145 145  135(135) 145 145  Elbow extension -45(-35) -22(-35) -28(-22)  -27(-18) -21(-20) -20(-16)  Wrist flexion         Wrist extension -30(10) -25(10) -10(30)  10(50) 19(50) 28(50)  Wrist ulnar deviation         Wrist radial deviation         Wrist pronation         Wrist supination   Limited by flexor tone    Limited by flexor tone  (Blank rows = not tested)      UPPER EXTREMITY MMT:     Right eval Right 03/21/2022 Right  05/25/2022  Left eval Left 03/21/2022 Left  05/25/2022  Shoulder flexion 3-/5 scaption 3-/5 3-/5  3-/5 3/5 3+/5  Shoulder abduction 3-/5 3-/5 3-/5  3-/5 3-/5 4-/5  Shoulder adduction         Shoulder extension         Shoulder internal rotation         Shoulder external rotation         Elbow flexion 3/5 3/5 3+/5  3/5 3+/5 4-/5  Elbow extension 2-/5 2/5 3-/5  2-/5 2/5   Wrist flexion         Wrist extension 2-/5 2-/5 2/5  2/5 2+/5 3-/5  Wrist ulnar deviation         Wrist radial deviation         Wrist pronation         Wrist supination         (Blank rows = not tested)     HAND FUNCTION:  Grip strength: Right: 0 lbs; Left: 0 lbs and Lateral pinch: Right: 5 lbs, Left: 2 lbs  03/21/2022: Lateral pinch: Right: 3.5 lbs, Left: 2 lbs  Bilateral digit PIP/DIP flexion contractures with MP  hyperextension with attempts for AROM. Pt. is able to tolerate AROM to the bilateral digits at the initial evaluation however, has a history of pain in the digits.  COORDINATION:  Eval: Pt. is unable to grasp 9-hole test pegs. Pt. is able to initiate grasping larger pegs, and is able to hold a  pen in the left hand.  SENSATION: Light touch: Impaired   EDEMA:  N/A  MUSCLE TONE: BUE flexor Spasticity  COGNITIO Overall cognitive status: Continue to assess in functional context  VISION:   Subjective report: Pt. was not wearing glasses at the time of the initial eval.  Baseline vision: Vision is very limited. Wears glasses all the time Visual history: History of impaired vision following CVA. Pt. Has received treatment through the [facility] low vision rehabilitation program.   VISION ASSESSMENT: Impaired To be further assessed in functional context  PERCEPTION: Impaired   PRAXIS: Impaired: motor planning  OBSERVATIONS:  Pt reports being on Tramadol   TODAY'S TREATMENT:    There. Ex.:  Gripper position was assessed. Pt. with good alignment of the gripper with the digits.Pt. tolerated PROM/AAROM to the bilateral elbow for extension, wrist extension, forearm supination,  and digit MP, IP, and DIP flexion, and extension, thumb abduction following recent Botox injections.      PATIENT EDUCATION: Education details: There. Margorie John Person educated: Patient and Parent Education method: Explanation, Demonstration, Tactile cues, and Verbal cues Education comprehension: verbalized understanding, returned demonstration, verbal cues required, and needs further education   HOME EXERCISE PROGRAM:   Continue ongoing assessment, and continue to provide as needed.     GOALS: Goals reviewed with patient? Yes  SHORT TERM GOALS: Target date:05/03/2022     To assess splint fit, and make appropriate adjustments to promote good skin integrity through the palmar surface of the bilateral  hands.  Baseline: 05/25/22: Goal currently met, however ongoing as needs to assess splint fit arise. 03/23/2022: Pt. is wearing splints a couple of hours at night bilateral resting hand splints. 03/21/2022: Pt. is wearing splints a couple of hours at night bilateral resting hand splints. Goal status: Continue ongoing assessment as requested, and as needed   LONG TERM GOALS: Target date: 06/13/2022     FOTO score will Improve by 2 points for Pt. perceived improvement with the assessment specific ADL/IADL tasks.  Baseline: 05/25/2022: FOTO score: 50,  TR score: 45 Eval: FOTO score: 40,  TR score: 45 Goal status: Achieved   2.   Pt. will independently perform oral care for 100% of the task after complete set-up. Baseline: 05/25/2022:  Pt. Is able to initiate and perform oral care for approximately 90% of the task. Complete set-up required. Assi needed only for the very back teeth. 03/23/2022: Pt. Is able to initiate and perform oral care for approximately 75% of the task. Pt. Requires assist at proximally at the elbow for through oral care. 03/21/2022: Pt. Is able to initiate and perform oral care for approximately 75% of the task. Pt. Requires assist at proximally at the elbow for through oral care. Eval: Pt. is able to initiate using an electric toothbrush. Pt. requires assist for set-up, and assist for thoroughness, and as he Pt. fatigues. Goal status: Ongoing  3.  Pt. Will be independent with self-feeding for 100% of the meal after complete set-up Baseline: 05/25/2022: Pt. Is able to use a spoon to scoop cereal when feeding himself cereal 85% of the time. Pt. Is able to feed himself snack/finger foods 100% of the time. Pt. Continues to work on consistency of  stabilizing a cup/mug when drinking. Pt. Is able to grasp a water bottle with assist initially, with assist tapering off as he drinks.03/23/2022: Pt. Is able to perform scooping cereal for 75% of the time. Pt. required assist, and support at the  left elbow, and Pt. Presents  with limited forearm supination when using the spoon, and bringing it towards his mouth. Pt. Is able to use a fork to spear items, and perform the hand to mouth pattern.  03/21/2022: Pt. Is able to perform scooping cereal for 75% of the time. Pt. required assist, and support at the left elbow, and Pt. presents with limited forearm supination when using the spoon, and bringing it towards his mouth. Pt. Is able to use a fork to spear items, and perform the hand to mouth pattern.  Eval: Pt. is able to hold standard standard utensils. Pt. Performs as much of the task as he, can and has assistance for the remainder. Goal status: Ongoing  4.  Pt. Will improve grasp patterns and consistently grasp 1/2" objects for ADL, and IADL tasks.  Baseline:05/25/2022: Pt. Is working on improving consistency of grasping 1/2" objects with visual cues.  03/23/2022: Pt. Is able to grasp 1" objects consistently,and continues to work on the hand patterns needed to grasp 1/2" objects.03/21/2022: Pt. Is able to grasp 1" objects consistently,and continues to work on the hand patterns needed to grasp 1/2" objects. Eval: Pt. is able to grasp 1" objects intermittently using a lateral grasp pattern. Goal status: Ongoing  5.  Pt. will independently write his name legibly with letter sizes under 2". Baseline: 05/25/2022: Pt. to continue to work towards formulating grasp patterns in preparation for grasping a large width pen. Pt. Requires visual cues. 03/23/2022: Pt. Is able to write his name with modA, however has difficulty with formulating letter sizes less than 2" in size with 50% legibility for the 3 letters of his name.03/21/2022: Pt. Is able to write his name with modA, however has difficulty with formulating letter sizes less than 2" in size with 50% legibility for the 3 letters of his name. Eval: Pt is able to hold a thin marker with his left hand, and formulate a line, and initiate a circular pattern (Pt.  without glasses today) Goal status: Ongoing  6. Pt. Will reach up to comb/brush his hair  with minA.  Baseline: 05/25/2022: Pt. Is able to reach up with the left hand to the left side, top, and back of his head. 03/23/2022: Pt. is now able to more consistently initiate reaching up to his head with his left hand in preparation for haircare12/18/2023: Pt. is now able to more consistently initiate reaching up to his head with his left hand in preparation for haircare. Eval: Pt. is able to initiate reaching up for hair care with a long handled brush, however is unable to sustain UE's in elevation to perform the task.     Goal status: Ongoing  7. Pt. Will independently navigate the w/c through his environment with minA with visual scanning, and hand placement on the controls.  Baseline: 05/25/2022: Pt. Requires minA  and  cues to navigate the w/c in wide spaces, and requires Mod cues to navigate the w/c through more narrow doorways, and tighter turns. Pt. Requires max cues for scanning through the environment, and moderate cues for hand placement on the controls.     Goal status:Ongoing       ASSESSMENT:  CLINICAL IMPRESSION:  Pt. tolerated  ROM well, however continues to present with right forearm soreness from recent Botox injections. Pt. Reports weaker left thumb grasp following Botox. Pt. Presents with more hyperextension at the thumb IP joint when attempting to abduct thumb. Following Botox, Pt.'s 2nd digit  MP extension in more relaxed, however presents with more difficulty with  securing a lateral pinch grasp on objects. Pt. continues to be able to grasp a water bottle with assist initially for hand placement, however required assist for hand placement. Pt. Tolerated the left grip strengthening exercises well. Pt. continues to work on the skills needed to reach up further, and sustain UEs in elevation during self-grooming tasks, as well as combinations of movements needed in the elbow forearm, and  wrist to stabilize, and support cups firmly when drinking, as well as hold and drink from a water bottle. Pt. Continues to present with Bilateral forearm, wrist, and digit PIP, and DIP tightness. Pt. continues to work on improving BUE functioning, w/c navigation, and visual compensatory strategies to improve, and maximize independence with ADLs, and IADLs.     PERFORMANCE DEFICITS in functional skills including ADLs, IADLs, coordination, dexterity, proprioception, ROM, strength, pain, [place], GMC, decreased knowledge of use of DME, and UE functional use, cognitive skills including safety awareness, and psychosocial skills including coping strategies, environmental adaptation, habits, and routines and behaviors.   IMPAIRMENTS are limiting patient from ADLs, IADLs, leisure, and social participation.   COMORBIDITIES may have co-morbidities  that affects occupational performance. Patient will benefit from skilled OT to address above impairments and improve overall function.  MODIFICATION OR ASSISTANCE TO COMPLETE EVALUATION: Maximum or significant modification of tasks or assist is necessary to complete an evaluation.  OT OCCUPATIONAL PROFILE AND HISTORY: Comprehensive assessment: Review of records and extensive additional review of physical, cognitive, psychosocial history related to current functional performance.  CLINICAL DECISION MAKING: High - multiple treatment options, significant modification of task necessary  REHAB POTENTIAL: Fair    EVALUATION COMPLEXITY: High    PLAN: OT FREQUENCY: 2 x's a week  OT DURATION:12 weeks  PLANNED INTERVENTIONS: self care/ADL training, therapeutic exercise, therapeutic activity, neuromuscular re-education, manual therapy, passive range of motion, patient/family education, and cognitive remediation/compensation  RECOMMENDED OTHER SERVICES: PT  CONSULTED AND AGREED WITH PLAN OF CARE: Patient and family member/caregiver  PLAN FOR NEXT SESSION: Initiate  treatment   Harrel Carina, MS, OTR/L   Harrel Carina, OT 06/09/2022, 9:12 AM

## 2022-06-13 ENCOUNTER — Ambulatory Visit: Payer: BC Managed Care – PPO

## 2022-06-13 ENCOUNTER — Ambulatory Visit: Payer: BC Managed Care – PPO | Admitting: Occupational Therapy

## 2022-06-15 ENCOUNTER — Encounter: Payer: BC Managed Care – PPO | Admitting: Occupational Therapy

## 2022-06-15 ENCOUNTER — Ambulatory Visit: Payer: BC Managed Care – PPO | Admitting: Occupational Therapy

## 2022-06-15 ENCOUNTER — Ambulatory Visit: Payer: BC Managed Care – PPO

## 2022-06-16 ENCOUNTER — Other Ambulatory Visit: Payer: Self-pay | Admitting: Dermatology

## 2022-06-16 DIAGNOSIS — L732 Hidradenitis suppurativa: Secondary | ICD-10-CM

## 2022-06-16 NOTE — Telephone Encounter (Signed)
Patient needs an appointment before more refills

## 2022-06-20 ENCOUNTER — Ambulatory Visit: Payer: BC Managed Care – PPO

## 2022-06-20 ENCOUNTER — Ambulatory Visit: Payer: BC Managed Care – PPO | Admitting: Occupational Therapy

## 2022-06-20 DIAGNOSIS — R2681 Unsteadiness on feet: Secondary | ICD-10-CM

## 2022-06-20 DIAGNOSIS — R269 Unspecified abnormalities of gait and mobility: Secondary | ICD-10-CM

## 2022-06-20 DIAGNOSIS — R278 Other lack of coordination: Secondary | ICD-10-CM

## 2022-06-20 DIAGNOSIS — M6281 Muscle weakness (generalized): Secondary | ICD-10-CM | POA: Diagnosis not present

## 2022-06-20 DIAGNOSIS — I6930 Unspecified sequelae of cerebral infarction: Secondary | ICD-10-CM | POA: Diagnosis not present

## 2022-06-20 DIAGNOSIS — R2689 Other abnormalities of gait and mobility: Secondary | ICD-10-CM | POA: Diagnosis not present

## 2022-06-20 DIAGNOSIS — R262 Difficulty in walking, not elsewhere classified: Secondary | ICD-10-CM

## 2022-06-20 NOTE — Therapy (Signed)
OUTPATIENT PHYSICAL THERAPY TREATMENT NOTE  Patient Name: Paul Hall MRN: [id number] DOB:Jul 19, 1995, 27 y.o., male Today's Date: 06/21/2022  PCP:  Loistine Chance PROVIDER:  Collene Mares, PA-C   PT End of Session - 06/20/22 1703     Visit Number 92    Number of Visits 114    Date for PT Re-Evaluation 08/29/22    Authorization Type [employer] and [employer]- Need auth past 12th visit    Authorization Time Period 01/04/21-03/29/21; Recert [id number]; Recert [id number]; Recert [id number]    Progress Note Due on Visit 100    PT Start Time 1630    PT Stop Time 1650    PT Time Calculation (min) 20 min    Equipment Utilized During Treatment Gait belt   Hoyer lift   Activity Tolerance Patient tolerated treatment well   queasiness/nausea   Behavior During Therapy WFL for tasks assessed/performed                         Past Medical History:  Diagnosis Date   Diabetes mellitus ([place])    Hypertension    Stroke [facility])    Past Surgical History:  Procedure Laterality Date   IVC FILTER PLACEMENT ([place] [facility])     LEG SURGERY     PEG TUBE PLACEMENT     TRACHEOSTOMY     Patient Active Problem List   Diagnosis Date Noted   Sepsis due to vancomycin resistant Enterococcus species ([place]) 06/06/2019   SIRS (systemic inflammatory response syndrome) ([place]) 06/05/2019   Acute lower UTI 06/05/2019   VRE (vancomycin-resistant Enterococci) infection 06/05/2019   Anemia 06/05/2019   Skin ulcer of sacrum with necrosis of muscle ([place])    Urinary retention    Type 2 diabetes mellitus without complication, with long-term current use of insulin ([facility])    Tachycardia    Lower extremity edema    Acute metabolic encephalopathy    Obstructive sleep apnea    Morbid obesity with BMI of 60.0-69.9, adult ([place])    Goals of care, counseling/discussion    Palliative care encounter    Sepsis ([place]) 04/27/2019   H/O insulin dependent diabetes mellitus 04/27/2019    History of CVA with residual deficit 04/27/2019   Seizure disorder ([place]) 04/27/2019   Decubitus ulcer of sacral region, stage 4 ([place]) 04/27/2019    REFERRING DIAG: Cerebral infarction, unspecified   THERAPY DIAG:  Muscle weakness (generalized)  Other lack of coordination  Difficulty in walking, not elsewhere classified  History of CVA with residual deficit  Unsteadiness on feet  Abnormality of gait and mobility  Other abnormalities of gait and mobility  Rationale for Evaluation and Treatment Rehabilitation  PERTINENT HISTORY: Paul Hall is a 25yoM who presents with severe weakness, quadriparesis, altered sensorium, and visual impairment s/p critical illness and prolonged hospitalization. Pt hospitalized in October 2020 with ARDS 2/2 COVID19 infection. Pt sustained a complex and lengthy hospitalization which included tracheostomy, prolonged sedation, ECMO. In this period pt sustained CVA and SDH. Pt has now been liberated from tracheostomy and G-tube. Pt has since been hospitalized for wound infection and UTI. Pt lives with parents at home, has hospital bed and left chair, hoyer lift transfers, and power WC for mobility needs. Pt needs heavy physical assistance with ADL 2/2 BUE contractures and motor dysfunction   PRECAUTIONS: Fall  SUBJECTIVE: Patient reports doing okay- Mother reports he has been putting more pressure through his feet. Paient reports some LE stiffness.     Patient  reports PAIN:   Are you having pain? No   TODAY'S TREATMENT:    *Limited secondary to patient was 20 min late due to transportation   [place]:   Passive LE stretching; gastroc, hamstring, hip rotators - x several min each LE. Ankle- seated attempted calf raises- some active movement yet unable to functionally lift heels off floor. Ankle - seated DF- some activation observed with shoes moving yet unable clear toe box off floor.  Active knee flex/ext- x 20 reps - able to clear the floor  and good slow motion Manual resistive Leg press x 10 reps each LE     PATIENT EDUCATION: Education details: standing posture and exercise technique Person educated: Patient Education method: Explanation, Demonstration, Tactile cues, and Verbal cues Education comprehension: verbalized understanding, returned demonstration, verbal cues required, tactile cues required, and needs further education   HOME EXERCISE PROGRAM:  Access Code: [email or web address] URL: [email or web address] Date: 03/23/2022 Prepared by: Sande Brothers  Exercises - Supine Bridge  - 3 x weekly - 3 sets - 10 reps - 2 hold - Supine Gluteal Sets  - 3 x weekly - 3 sets - 10 reps - 5 sec hold - Supine Quad Set  - 1 x daily - 3 x weekly - 3 sets - 10 reps - 5 hold - Seated Long Arc Quad  - 1 x daily - 7 x weekly - 3 sets - 10 reps - Seated Hip Adduction Squeeze with Ball  - 1 x daily - 3 x weekly - 3 sets - 10 reps - 5 hold - Seated Hip Abduction  - 1 x daily - 3 x weekly - 3 sets - 10 reps - 2 hold    PT Short Term Goals -       PT SHORT TERM GOAL #1   Title Pt will be independent with HEP in order to improve strength and balance in order to decrease fall risk and improve function at home and work.    Baseline 01/04/2021= No formal HEP in place; 12/12 no HEP in place; 05/10/2021-Patient and his father were able to report compliance with curent HEP consisting of mostly seated/reclined LE strengthening. Both verbalize no questions at this time.    Time 6    Period Weeks    Status Achieved    Target Date 02/15/21              PT Long Term Goals - Date for completion: 06/01/2022         PT LONG TERM GOAL #1   Title Patient will increase BLE gross strength by 1/2 muscle grade to improve functional strength for improved independence with potential gait, increased standing tolerance and increased ADL ability.    Baseline 01/04/2021- Patient presents with 1/5 to 3-/5 B LE strength with MMT; 12/12: goal  partially met for Left knee/hip; 05/10/2021= 2-/5 bilateal Hip flex; 3+/5 bilateral Knee ext; 06/21/2021= Patient presents with 2-/5 bilateral Hip flex; 3+/5 bilateral knee ext/flex; 2-/5 left ankle DF; 0/5 right ankle- and able to increase reps and resistance with LE's. 09/15/2021- Patient technically presents with 2-/5 B hip flex/abd/add - but he is able to raise his hip up to approx 100 deg which has improved. 3+/5 Bilateral knee ext, 2-/5 left ankle and 0/5 right ankle.  12/08/2021= Patient able to lift left knee at 110 deg of hip flex; presents with 3+/5 knee ext, 2-/5 left ankle DF and 0/5 right ankle DF, 2-/5 bilateral Hip abd in seated position.   12/6:  R: knee 3+/5 ext, 2/5 flexion, left knee 3+/5 extension, 3+/5 flexion, R hip: 2+/5 hip add, 2+/5 hip ABD L hip: 4-/5 hip ABD, 3+/5 hip ADD, 3+/5 hip flexion; 06/06/2022= Patient now presents with 2-/5 right ankle DF/PF   Time 12    Period Weeks    Status MET   Target Date 03/07/2022     PT LONG TERM GOAL #2   Title Patient will tolerate sitting unsupported demonstrating erect sitting posture for 15 minutes with CGA to demonstrate improved back extensor strength and improved sitting tolerance.    Baseline 01/04/2021- Patient confied to sitting in lift chair or electric power chair with back support and unable to sit upright without physical assistance; 12/12: tolerates <1 minutes upright unsupported sitting. 05/10/2021=static sit with forward trunk lean  in his power wheelchair without back support x approx 3 min. 06/21/2021=Unable to assess today due to patient with acute back pain but on previous visit able to sit x 8 min without back support. 09/15/2021- on last visit- 09/13/2021- patient was able to sit unsupported x 8 min at edge of mat. 10/13/2023 - Patient was able to sit at edge of mat with varying level of assist today from SBA to min A for a total of 20 min. 12/13/2021= Patient demonstrated unsupported sitting at edge of mat for approx 20 min   Time 12     Period Weeks    Status GOAL MET   Target Date 12/08/21      PT LONG TERM GOAL #3   Title Patient will demonstrate ability to perform static standing in // bars > 2 min with Max Assist  without loss of balance and fair posture for improved overall strength for pre-gait and transfer activities.    Baseline 01/04/2021= Patient current uanble to stand- Dependent on hoyer or sit to stand lift for transfers. 05/10/2021=Not appropriate yet- Currently still dependent with all transfers using hoyer. 06/21/2021= Patient continuing now to focus on LE strengthening to prepare for standing-unable to try today due to acute low back pain-  planning on attempting in new cert period. 09/15/2021- Patient has attempted standing 2x in past two week- max Assist of 2 people - only once was he successful to clearing his bottom from chair - Will continue to be a focus during the new certification. 12/13/2021= Patient has been limited secondary to increased overall low back pain during this certification and will require more time to focus on this goal.  12/6: not assessed this date, will assess at date when 2-3 PTs are present for assistance    Time 12    Period Weeks    Status GOAL not appropriate at this time - may attempt in future once Patient presents with improved overall LE strength.          PT LONG TERM GOAL #4   Title Pt will improve FOTO score by 10 points or more demonstrating improved perceived functional ability    Baseline FOTO 7 on 10/17; 03/15/21: FOTO 12; 05/10/2021 06/21/2021= 1; 09/15/2021= 9; 12/13/2021= Will issue next visit 12/6: 4; 06/06/2022= Will assess next visit   Time 12    Period Weeks    Status ONGOING   Target date 08/29/2022     PT LONG TERM GOAL #5   Title Patient will perform sit to stand transfer with appropriate AD and max assist of 2 people with 75% consistency to prepare for pregait activities.    Baseline 09/15/2021= Patient unable to stand well- unable to clear  his bottom off chair with  Max assist of 2 persons. 12/13/2021- Goal not appropriate to try yet but will keep and roll over to next cert as shift continues to focus on transfers/standing   Time 12    Period Weeks    Status Goal still not appropriate for now but will keep active for future   Target date    PT LONG TERM GOAL #6  Title Patient will tolerate sitting unsupported demonstrating erect sitting posture for 30 minutes with CGA to demonstrate improved back extensor strength and improved sitting tolerance.   Baseline 12/13/2021= Patient demonstrated unsupported sitting at edge of mat for approx 20 min;  12/29/2021- Patient performed approx 30 min of dynamic sitting activities today. 06/06/2022= Patient demonstrated ability to sit and perform static and dynamic UE/LE movement with only Supervision.   Time 12   Period Weeks   Status GOAL MET         7.  Patient will tolerate 5 minutes or more of standing in sit to stand lift in order to indicate improved lower extremity weightbearing tolerance for progression to standing in parallel bars. Baseline: 1 minute on most recent stand 02/21/22; 06/06/2022- Patient did attempt today after complaining of right LE pain last week. He attempted 3 stands using sit to stand lift. - all over 1 min- last one approx 2min 48 sec- stopped due to fatigue. More erect standing in lift today - still poor gluteal strength but able to activate glutes and extend hips upon command but unable to hold > 5 sec.  Goal status: ONGOING- Progressing Target date: 08/29/2022  8. Patient will tolerate sitting unsupported demonstrating ability to perform dynamic UE/LE activities for 30 minutes  independently to demonstrate ability to sit at edge of bed to eat or perform some ADL's/exercise for optimal quality of life.  Baseline: 06/06/2022- Patient able to sit and perform some UE/LE exercises but requires CGA at times for safety- he is able to static sit for 30 min with supervision.   Goal Status: NEW  Target date:  08/29/2022       Plan     Clinical Impression Statement Treatment very limited today secondary to patient 30 min late due to transportation. HE was able to achieve near full right knee ext as well as added force with contraction during manual resistive leg press. Pt will continue to benefit from skilled physical therapy intervention to address impairments, improve QOL, and attain therapy goals.    Personal Factors and Comorbidities Comorbidity 3+;Time since onset of injury/illness/exacerbation    Comorbidities CVA, diabetes, Seizures    Examination-Activity Limitations Bathing;Bed Mobility;Bend;Caring for Others;Carry;Dressing;Hygiene/Grooming;Lift;Locomotion Level;Reach Overhead;Self Feeding;Sit;Squat;Stairs;Stand;Transfers;Toileting    Examination-Participation Restrictions Cleaning;Community Activity;Driving;Laundry;Medication Management;Meal Prep;Occupation;Personal Finances;Shop;Yard Work;Volunteer    Stability/Clinical Decision Making Evolving/Moderate complexity    Rehab Potential Fair    PT Frequency 2x / week    PT Duration 12 weeks    PT Treatment/Interventions ADLs/Self Care Home Management;Cryotherapy;Electrical Stimulation;Moist Heat;Ultrasound;DME Instruction;Gait training;Stair training;Functional mobility training;Therapeutic exercise;Balance training;Patient/family education;Orthotic Fit/Training;Neuromuscular re-education;Wheelchair mobility training;Manual techniques;Passive range of motion;Dry needling;Energy conservation;Taping;Visual/perceptual remediation/compensation;Joint Manipulations    PT Next Visit Plan core strength- sitting at EOM, postural control, sit to stand if able; Continue with progressive LE Strengthening.    PT Home Exercise Plan No changes to HEP today    Consulted and Agree with Plan of Care Patient;Family member/caregiver    Family Member Consulted Mom             Ollen Bowl, PT Physical Therapist- [place]  [facility]  06/21/2022, 9:13 AM

## 2022-06-21 NOTE — Therapy (Signed)
Occupational Therapy Treatment Note  Patient Name: Paul Hall MRN: [id number] DOB:12-20-1995, 27 y.o., male Today's Date: 06/21/2022  PCP: Dr. Gerarda Fraction REFERRING PROVIDER: Dr. Gerarda Fraction   OT End of Session - 06/21/22 0851     Visit Number 24    Number of Visits 46    Date for OT Re-Evaluation 08/17/22    Authorization Type Progress report period starting 10/06/2021    OT Start Time 1650    OT Stop Time 1735    OT Time Calculation (min) 45 min    Equipment Utilized During Treatment tilt in space power wc    Activity Tolerance Patient tolerated treatment well    Behavior During Therapy WFL for tasks assessed/performed                     Past Medical History:  Diagnosis Date   Diabetes mellitus ([place])    Hypertension    Stroke ([place])    Past Surgical History:  Procedure Laterality Date   IVC FILTER PLACEMENT ([place])     LEG SURGERY     PEG TUBE PLACEMENT     TRACHEOSTOMY     Patient Active Problem List   Diagnosis Date Noted   Sepsis due to vancomycin resistant Enterococcus species ([place]) 06/06/2019   SIRS (systemic inflammatory response syndrome) ([place]) 06/05/2019   Acute lower UTI 06/05/2019   VRE (vancomycin-resistant Enterococci) infection 06/05/2019   Anemia 06/05/2019   Skin ulcer of sacrum with necrosis of muscle ([place])    Urinary retention    Type 2 diabetes mellitus without complication, with long-term current use of insulin ([facility])    Tachycardia    Lower extremity edema    Acute metabolic encephalopathy    Obstructive sleep apnea    Morbid obesity with BMI of 60.0-69.9, adult ([place])    Goals of care, counseling/discussion    Palliative care encounter    Sepsis ([place]) 04/27/2019   H/O insulin dependent diabetes mellitus 04/27/2019   History of CVA with residual deficit 04/27/2019   Seizure disorder ([place]) 04/27/2019   Decubitus ulcer of sacral region, stage 4 ([place]) 04/27/2019    ONSET DATE: 01/2019  REFERRING DIAG: CVA/COVID-19  THERAPY  DIAG:  Muscle weakness (generalized)  Other lack of coordination  Rationale for Evaluation and Treatment Rehabilitation  SUBJECTIVE:   SUBJECTIVE STATEMENT:  The Pt. and his mother presented a video of the Pt. Performing self-feeding with modified independence  Pt accompanied by: self and family member  PERTINENT HISTORY:  Pt. is a 27 y.o. male who was diagnosed with COVID-19, and CVA with resultant quadriplegia. Pt. Was then hospitalized with VRE UTI. PMHx includes: urinary retention, seizure disorder, obstructive sleep disorder, DM Type II, Morbid obesity.   PRECAUTIONS: None  WEIGHT BEARING RESTRICTIONS No  PAIN:  Are you having pain? No-soreness in right forearm from recent Botox.  FALLS: Has patient fallen in last 6 months? No  LIVING ENVIRONMENT: Lives with: lives with their family Lives in: House/apartment Stairs: No Level Entry Has following equipment at home: Wheelchair (power) and hoyer lift, sit to stand lift  PLOF: Independent  PATIENT GOALS: To be able to engage in more daily care tasks.  OBJECTIVE:   HAND DOMINANCE: Right  ADLs:  Transfers/ambulation related to ADLs: Eating: Pt. reports being able to hold standard utensils, and is starting to engage more in self-feeding tasks, hand to mouth patterns. Pt. Reports that he does as much as he can with the task, and family assists  with  the remainder of the task. Grooming: Pt. Is able to initiate holding an electric toothbrush, and brush his teeth. Family assists LB Dressing: Total Assist UE dressing: Pt. is now able to reach up to actively assist with grasping , and pulling his gown down. Toileting:  Total Assist Bathing: MaxA UB, Total assist LB Tub Shower transfers: N/A Equipment: See above    IADLs: Shopping: Relies on family to assist Light housekeeping: Total Assist Meal Prep: Total Assist Community mobility:   Medication management:  Total Assist  Financial management: N/A Handwriting: Not  legible: Pt. Is able to hold a pen with the left hand, and initiate marking the page. Pt.'s eye glasses were not available  MOBILITY STATUS:  Power w/c  POSTURE COMMENTS:  Pt. Requires position changes in his power w/c  ACTIVITY TOLERANCE: Activity tolerance:  Fair  FUNCTIONAL OUTCOME MEASURES: FOTO: 40  TR score: 45  05/25/2022:   FOTO: 50 TR score: 45   UPPER EXTREMITY ROM     Active ROM Right eval Right 03/21/2022 Right 05/25/2022  Left eval Left  03/21/2022 Left 05/25/2022  Shoulder flexion 106 scaption 105  Scaption 62 flexion 110 scaption  119 123 125  Shoulder abduction 114 90 97  110 115 115  Shoulder adduction         Shoulder extension         Shoulder internal rotation         Shoulder external rotation         Elbow flexion 120(130) 145 145  135(135) 145 145  Elbow extension -45(-35) -22(-35) -28(-22)  -27(-18) -21(-20) -20(-16)  Wrist flexion         Wrist extension -30(10) -25(10) -10(30)  10(50) 19(50) 28(50)  Wrist ulnar deviation         Wrist radial deviation         Wrist pronation         Wrist supination   Limited by flexor tone    Limited by flexor tone  (Blank rows = not tested)      UPPER EXTREMITY MMT:     Right eval Right 03/21/2022 Right  05/25/2022  Left eval Left 03/21/2022 Left  05/25/2022  Shoulder flexion 3-/5 scaption 3-/5 3-/5  3-/5 3/5 3+/5  Shoulder abduction 3-/5 3-/5 3-/5  3-/5 3-/5 4-/5  Shoulder adduction         Shoulder extension         Shoulder internal rotation         Shoulder external rotation         Elbow flexion 3/5 3/5 3+/5  3/5 3+/5 4-/5  Elbow extension 2-/5 2/5 3-/5  2-/5 2/5   Wrist flexion         Wrist extension 2-/5 2-/5 2/5  2/5 2+/5 3-/5  Wrist ulnar deviation         Wrist radial deviation         Wrist pronation         Wrist supination         (Blank rows = not tested)     HAND FUNCTION:  Grip strength: Right: 0 lbs; Left: 0 lbs and Lateral pinch: Right: 5 lbs, Left: 2  lbs  03/21/2022: Lateral pinch: Right: 3.5 lbs, Left: 2 lbs  Bilateral digit PIP/DIP flexion contractures with MP hyperextension with attempts for AROM. Pt. is able to tolerate AROM to the bilateral digits at the initial evaluation however, has a history of pain in the digits.  COORDINATION:  Eval: Pt. is unable to grasp 9-hole test pegs. Pt. is able to initiate grasping larger pegs, and is able to hold a pen in the left hand.  SENSATION: Light touch: Impaired   EDEMA:  N/A  MUSCLE TONE: BUE flexor Spasticity  COGNITIO Overall cognitive status: Continue to assess in functional context  VISION:   Subjective report: Pt. was not wearing glasses at the time of the initial eval.  Baseline vision: Vision is very limited. Wears glasses all the time Visual history: History of impaired vision following CVA. Pt. Has received treatment through [facility] low vision rehabilitation program.   VISION ASSESSMENT: Impaired To be further assessed in functional context  PERCEPTION: Impaired   PRAXIS: Impaired: motor planning  OBSERVATIONS:  Pt reports being on Tramadol   TODAY'S TREATMENT:   Ther. Ex.:  Pt. tolerated PROM/AAROM to the bilateral digit MP, IP, and DIP flexion, and extension, thumb abduction.  Neuromuscular re-education:  Pt. worked on bilateral hand coordination skills needed in preparation for tying, grasping hold of zippers and zipping horizontally, placing items into a pant pocket while holding the pocket open with the opposite hand.  Pt. Worked on grasping and pulling tabs laterally.     PATIENT EDUCATION: Education details: There. Margorie John Person educated: Patient and Parent Education method: Explanation, Demonstration, Tactile cues, and Verbal cues Education comprehension: verbalized understanding, returned demonstration, verbal cues required, and needs further education   HOME EXERCISE PROGRAM:   Continue ongoing assessment, and continue to provide as  needed.     GOALS: Goals reviewed with patient? Yes  SHORT TERM GOALS: Target date:05/03/2022     To assess splint fit, and make appropriate adjustments to promote good skin integrity through the palmar surface of the bilateral hands.  Baseline: 05/25/22: Goal currently met, however ongoing as needs to assess splint fit arise. 03/23/2022: Pt. is wearing splints a couple of hours at night bilateral resting hand splints. 03/21/2022: Pt. is wearing splints a couple of hours at night bilateral resting hand splints. Goal status: Continue ongoing assessment as requested, and as needed   LONG TERM GOALS: Target date: 06/13/2022     FOTO score will Improve by 2 points for Pt. perceived improvement with the assessment specific ADL/IADL tasks.  Baseline: 05/25/2022: FOTO score: 50,  TR score: 45 Eval: FOTO score: 40,  TR score: 45 Goal status: Achieved   2.   Pt. will independently perform oral care for 100% of the task after complete set-up. Baseline: 05/25/2022:  Pt. Is able to initiate and perform oral care for approximately 90% of the task. Complete set-up required. Assi needed only for the very back teeth. 03/23/2022: Pt. Is able to initiate and perform oral care for approximately 75% of the task. Pt. Requires assist at proximally at the elbow for through oral care. 03/21/2022: Pt. Is able to initiate and perform oral care for approximately 75% of the task. Pt. Requires assist at proximally at the elbow for through oral care. Eval: Pt. is able to initiate using an electric toothbrush. Pt. requires assist for set-up, and assist for thoroughness, and as he Pt. fatigues. Goal status: Ongoing  3.  Pt. Will be independent with self-feeding for 100% of the meal after complete set-up Baseline: 05/25/2022: Pt. Is able to use a spoon to scoop cereal when feeding himself cereal 85% of the time. Pt. Is able to feed himself snack/finger foods 100% of the time. Pt. Continues to work on consistency of  stabilizing  a cup/mug  when drinking. Pt. Is able to grasp a water bottle with assist initially, with assist tapering off as he drinks.03/23/2022: Pt. Is able to perform scooping cereal for 75% of the time. Pt. required assist, and support at the left elbow, and Pt. Presents with limited forearm supination when using the spoon, and bringing it towards his mouth. Pt. Is able to use a fork to spear items, and perform the hand to mouth pattern.  03/21/2022: Pt. Is able to perform scooping cereal for 75% of the time. Pt. required assist, and support at the left elbow, and Pt. presents with limited forearm supination when using the spoon, and bringing it towards his mouth. Pt. Is able to use a fork to spear items, and perform the hand to mouth pattern.  Eval: Pt. is able to hold standard standard utensils. Pt. Performs as much of the task as he, can and has assistance for the remainder. Goal status: Ongoing  4.  Pt. Will improve grasp patterns and consistently grasp 1/2" objects for ADL, and IADL tasks.  Baseline:05/25/2022: Pt. Is working on improving consistency of grasping 1/2" objects with visual cues.  03/23/2022: Pt. Is able to grasp 1" objects consistently,and continues to work on the hand patterns needed to grasp 1/2" objects.03/21/2022: Pt. Is able to grasp 1" objects consistently,and continues to work on the hand patterns needed to grasp 1/2" objects. Eval: Pt. is able to grasp 1" objects intermittently using a lateral grasp pattern. Goal status: Ongoing  5.  Pt. will independently write his name legibly with letter sizes under 2". Baseline: 05/25/2022: Pt. to continue to work towards formulating grasp patterns in preparation for grasping a large width pen. Pt. Requires visual cues. 03/23/2022: Pt. Is able to write his name with modA, however has difficulty with formulating letter sizes less than 2" in size with 50% legibility for the 3 letters of his name.03/21/2022: Pt. Is able to write his name with modA, however  has difficulty with formulating letter sizes less than 2" in size with 50% legibility for the 3 letters of his name. Eval: Pt is able to hold a thin marker with his left hand, and formulate a line, and initiate a circular pattern (Pt. without glasses today) Goal status: Ongoing  6. Pt. Will reach up to comb/brush his hair  with minA.  Baseline: 05/25/2022: Pt. Is able to reach up with the left hand to the left side, top, and back of his head. 03/23/2022: Pt. is now able to more consistently initiate reaching up to his head with his left hand in preparation for haircare12/18/2023: Pt. is now able to more consistently initiate reaching up to his head with his left hand in preparation for haircare. Eval: Pt. is able to initiate reaching up for hair care with a long handled brush, however is unable to sustain UE's in elevation to perform the task.     Goal status: Ongoing  7. Pt. Will independently navigate the w/c through his environment with minA with visual scanning, and hand placement on the controls.  Baseline: 05/25/2022: Pt. Requires minA  and  cues to navigate the w/c in wide spaces, and requires Mod cues to navigate the w/c through more narrow doorways, and tighter turns. Pt. Requires max cues for scanning through the environment, and moderate cues for hand placement on the controls.     Goal status:Ongoing       ASSESSMENT:  CLINICAL IMPRESSION:  Pt. is now feeding himself cereal with modified independence, and set-up. Pt. required  step-by-step visual cues, and hand over hand assist for form, and technique. Pt. continues to work on the skills needed to reach up further, and sustain UEs in elevation during self-grooming tasks, as well as combinations of movements needed in the elbow forearm, and wrist to stabilize, and support cups firmly when drinking, as well as hold and drink from a water bottle. Pt. continues to present with Bilateral forearm, wrist, and digit PIP, and DIP tightness. Pt.  continues to work on improving BUE functioning, w/c navigation, and visual compensatory strategies to improve, and maximize independence with ADLs, and IADLs.     PERFORMANCE DEFICITS in functional skills including ADLs, IADLs, coordination, dexterity, proprioception, ROM, strength, pain, [place], [facility], decreased knowledge of use of DME, and UE functional use, cognitive skills including safety awareness, and psychosocial skills including coping strategies, environmental adaptation, habits, and routines and behaviors.   IMPAIRMENTS are limiting patient from ADLs, IADLs, leisure, and social participation.   COMORBIDITIES may have co-morbidities  that affects occupational performance. Patient will benefit from skilled OT to address above impairments and improve overall function.  MODIFICATION OR ASSISTANCE TO COMPLETE EVALUATION: Maximum or significant modification of tasks or assist is necessary to complete an evaluation.  OT OCCUPATIONAL PROFILE AND HISTORY: Comprehensive assessment: Review of records and extensive additional review of physical, cognitive, psychosocial history related to current functional performance.  CLINICAL DECISION MAKING: High - multiple treatment options, significant modification of task necessary  REHAB POTENTIAL: Fair    EVALUATION COMPLEXITY: High    PLAN: OT FREQUENCY: 2 x's a week  OT DURATION:12 weeks  PLANNED INTERVENTIONS: self care/ADL training, therapeutic exercise, therapeutic activity, neuromuscular re-education, manual therapy, passive range of motion, patient/family education, and cognitive remediation/compensation  RECOMMENDED OTHER SERVICES: PT  CONSULTED AND AGREED WITH PLAN OF CARE: Patient and family member/caregiver  PLAN FOR NEXT SESSION: Initiate treatment   Harrel Carina, MS, OTR/L   Harrel Carina, OT 06/21/2022, 8:55 AM

## 2022-06-22 ENCOUNTER — Encounter: Payer: BC Managed Care – PPO | Admitting: Occupational Therapy

## 2022-06-22 ENCOUNTER — Ambulatory Visit: Payer: BC Managed Care – PPO | Admitting: Occupational Therapy

## 2022-06-22 ENCOUNTER — Ambulatory Visit: Payer: BC Managed Care – PPO

## 2022-06-22 DIAGNOSIS — R269 Unspecified abnormalities of gait and mobility: Secondary | ICD-10-CM

## 2022-06-22 DIAGNOSIS — M6281 Muscle weakness (generalized): Secondary | ICD-10-CM

## 2022-06-22 DIAGNOSIS — R2689 Other abnormalities of gait and mobility: Secondary | ICD-10-CM | POA: Diagnosis not present

## 2022-06-22 DIAGNOSIS — I6930 Unspecified sequelae of cerebral infarction: Secondary | ICD-10-CM | POA: Diagnosis not present

## 2022-06-22 DIAGNOSIS — L8994 Pressure ulcer of unspecified site, stage 4: Secondary | ICD-10-CM | POA: Diagnosis not present

## 2022-06-22 DIAGNOSIS — R2681 Unsteadiness on feet: Secondary | ICD-10-CM

## 2022-06-22 DIAGNOSIS — R262 Difficulty in walking, not elsewhere classified: Secondary | ICD-10-CM

## 2022-06-22 DIAGNOSIS — R32 Unspecified urinary incontinence: Secondary | ICD-10-CM | POA: Diagnosis not present

## 2022-06-22 DIAGNOSIS — R278 Other lack of coordination: Secondary | ICD-10-CM

## 2022-06-22 DIAGNOSIS — I6389 Other cerebral infarction: Secondary | ICD-10-CM | POA: Diagnosis not present

## 2022-06-22 NOTE — Therapy (Signed)
OUTPATIENT PHYSICAL THERAPY TREATMENT NOTE  Patient Name: Paul Hall MRN: [id number] DOB:[birth year], 27 y.o., male Today's Date: 06/22/2022  PCP:  Paul Hall PROVIDER:  Collene Mares, PA-C   PT End of Session - 06/22/22 1659     Visit Number 93    Number of Visits 114    Date for PT Re-Evaluation 08/29/22    Authorization Type [employer] and [employer]- Need auth past 12th visit    Authorization Time Period 01/04/21-03/29/21; Recert [id number]; Recert [id number]; Recert [id number]    Progress Note Due on Visit 100    PT Start Time 1605    PT Stop Time 1645    PT Time Calculation (min) 40 min    Equipment Utilized During Treatment Gait belt   Hoyer lift   Activity Tolerance Patient tolerated treatment well   queasiness/nausea   Behavior During Therapy WFL for tasks assessed/performed                         Past Medical History:  Diagnosis Date   Diabetes mellitus ([place])    Hypertension    Stroke [facility])    Past Surgical History:  Procedure Laterality Date   IVC FILTER PLACEMENT ([place] [facility])     LEG SURGERY     PEG TUBE PLACEMENT     TRACHEOSTOMY     Patient Active Problem List   Diagnosis Date Noted   Sepsis due to vancomycin resistant Enterococcus species ([place]) 06/06/2019   SIRS (systemic inflammatory response syndrome) ([place]) 06/05/2019   Acute lower UTI 06/05/2019   VRE (vancomycin-resistant Enterococci) infection 06/05/2019   Anemia 06/05/2019   Skin ulcer of sacrum with necrosis of muscle ([place])    Urinary retention    Type 2 diabetes mellitus without complication, with long-term current use of insulin ([facility])    Tachycardia    Lower extremity edema    Acute metabolic encephalopathy    Obstructive sleep apnea    Morbid obesity with BMI of 60.0-69.9, adult ([place])    Goals of care, counseling/discussion    Palliative care encounter    Sepsis ([place]) 04/27/2019   H/O insulin dependent diabetes mellitus 04/27/2019    History of CVA with residual deficit 04/27/2019   Seizure disorder ([place]) 04/27/2019   Decubitus ulcer of sacral region, stage 4 ([place]) 04/27/2019    REFERRING DIAG: Cerebral infarction, unspecified   THERAPY DIAG:  Muscle weakness (generalized)  Other lack of coordination  Difficulty in walking, not elsewhere classified  History of CVA with residual deficit  Unsteadiness on feet  Abnormality of gait and mobility  Other abnormalities of gait and mobility  Rationale for Evaluation and Treatment Rehabilitation  PERTINENT HISTORY: Paul Hall is a 25yoM who presents with severe weakness, quadriparesis, altered sensorium, and visual impairment s/p critical illness and prolonged hospitalization. Pt hospitalized in October 2020 with ARDS 2/2 COVID19 infection. Pt sustained a complex and lengthy hospitalization which included tracheostomy, prolonged sedation, ECMO. In this period pt sustained CVA and SDH. Pt has now been liberated from tracheostomy and G-tube. Pt has since been hospitalized for wound infection and UTI. Pt lives with parents at home, has hospital bed and left chair, hoyer lift transfers, and power WC for mobility needs. Pt needs heavy physical assistance with ADL 2/2 BUE contractures and motor dysfunction   PRECAUTIONS: Fall  SUBJECTIVE: Patient reports some "numbness into left foot" Thinks this came from increased pressure on feet with more weight bearing at home. He states otherwise doing  well.      Patient reports PAIN:   Are you having pain? No   TODAY'S TREATMENT:  -  Dependent transfer from power lift chair to mat table  Therapeutic activities:   Static sitting- at [facility]- Independent for short duration - <5 min today.  Progressed to performing dynmaic activities as listed below in therex:   THEREX: (seated at [facility])    Ankle- seated attempted calf raises- some active movement   Active knee flex/ext- x 20 reps - able to clear the floor and good slow  motion  Cross body reaching with turning head x 15 reps each side  Abdominal crunch into lumbar ext- x 20 reps (attempting to straight arms)   Hip march with lateral trunk lean (to simulate placing a slide board under patient) x 15 reps each.          PATIENT EDUCATION: Education details: standing posture and exercise technique Person educated: Patient Education method: Explanation, Demonstration, Tactile cues, and Verbal cues Education comprehension: verbalized understanding, returned demonstration, verbal cues required, tactile cues required, and needs further education   HOME EXERCISE PROGRAM:  Access Code: [email or web address] URL: [email or web address] Date: 03/23/2022 Prepared by: Paul Hall  Exercises - Supine Bridge  - 3 x weekly - 3 sets - 10 reps - 2 hold - Supine Gluteal Sets  - 3 x weekly - 3 sets - 10 reps - 5 sec hold - Supine Quad Set  - 1 x daily - 3 x weekly - 3 sets - 10 reps - 5 hold - Seated Long Arc Quad  - 1 x daily - 7 x weekly - 3 sets - 10 reps - Seated Hip Adduction Squeeze with Ball  - 1 x daily - 3 x weekly - 3 sets - 10 reps - 5 hold - Seated Hip Abduction  - 1 x daily - 3 x weekly - 3 sets - 10 reps - 2 hold    PT Short Term Goals -       PT SHORT TERM GOAL #1   Title Pt will be independent with HEP in order to improve strength and balance in order to decrease fall risk and improve function at home and work.    Baseline 01/04/2021= No formal HEP in place; 12/12 no HEP in place; 05/10/2021-Patient and his father were able to report compliance with curent HEP consisting of mostly seated/reclined LE strengthening. Both verbalize no questions at this time.    Time 6    Period Weeks    Status Achieved    Target Date 02/15/21              PT Long Term Goals - Date for completion: 06/01/2022         PT LONG TERM GOAL #1   Title Patient will increase BLE gross strength by 1/2 muscle grade to improve functional strength for  improved independence with potential gait, increased standing tolerance and increased ADL ability.    Baseline 01/04/2021- Patient presents with 1/5 to 3-/5 B LE strength with MMT; 12/12: goal partially met for Left knee/hip; 05/10/2021= 2-/5 bilateal Hip flex; 3+/5 bilateral Knee ext; 06/21/2021= Patient presents with 2-/5 bilateral Hip flex; 3+/5 bilateral knee ext/flex; 2-/5 left ankle DF; 0/5 right ankle- and able to increase reps and resistance with LE's. 09/15/2021- Patient technically presents with 2-/5 B hip flex/abd/add - but he is able to raise his hip up to approx 100 deg which has improved. 3+/5 Bilateral knee ext, 2-/5 left ankle and  0/5 right ankle.  12/08/2021= Patient able to lift left knee at 110 deg of hip flex; presents with 3+/5 knee ext, 2-/5 left ankle DF and 0/5 right ankle DF, 2-/5 bilateral Hip abd in seated position.   12/6: R: knee 3+/5 ext, 2/5 flexion, left knee 3+/5 extension, 3+/5 flexion, R hip: 2+/5 hip add, 2+/5 hip ABD L hip: 4-/5 hip ABD, 3+/5 hip ADD, 3+/5 hip flexion; 06/06/2022= Patient now presents with 2-/5 right ankle DF/PF   Time 12    Period Weeks    Status MET   Target Date 03/07/2022     PT LONG TERM GOAL #2   Title Patient will tolerate sitting unsupported demonstrating erect sitting posture for 15 minutes with CGA to demonstrate improved back extensor strength and improved sitting tolerance.    Baseline 01/04/2021- Patient confied to sitting in lift chair or electric power chair with back support and unable to sit upright without physical assistance; 12/12: tolerates <1 minutes upright unsupported sitting. 05/10/2021=static sit with forward trunk lean  in his power wheelchair without back support x approx 3 min. 06/21/2021=Unable to assess today due to patient with acute back pain but on previous visit able to sit x 8 min without back support. 09/15/2021- on last visit- 09/13/2021- patient was able to sit unsupported x 8 min at edge of mat. 10/13/2023 - Patient was able to  sit at edge of mat with varying level of assist today from SBA to min A for a total of 20 min. 12/13/2021= Patient demonstrated unsupported sitting at edge of mat for approx 20 min   Time 12    Period Weeks    Status GOAL MET   Target Date 12/08/21      PT LONG TERM GOAL #3   Title Patient will demonstrate ability to perform static standing in // bars > 2 min with Max Assist  without loss of balance and fair posture for improved overall strength for pre-gait and transfer activities.    Baseline 01/04/2021= Patient current uanble to stand- Dependent on hoyer or sit to stand lift for transfers. 05/10/2021=Not appropriate yet- Currently still dependent with all transfers using hoyer. 06/21/2021= Patient continuing now to focus on LE strengthening to prepare for standing-unable to try today due to acute low back pain-  planning on attempting in new cert period. 09/15/2021- Patient has attempted standing 2x in past two week- max Assist of 2 people - only once was he successful to clearing his bottom from chair - Will continue to be a focus during the new certification. 12/13/2021= Patient has been limited secondary to increased overall low back pain during this certification and will require more time to focus on this goal.  12/6: not assessed this date, will assess at date when 2-3 PTs are present for assistance    Time 12    Period Weeks    Status GOAL not appropriate at this time - may attempt in future once Patient presents with improved overall LE strength.          PT LONG TERM GOAL #4   Title Pt will improve FOTO score by 10 points or more demonstrating improved perceived functional ability    Baseline FOTO 7 on 10/17; 03/15/21: FOTO 12; 05/10/2021 06/21/2021= 1; 09/15/2021= 9; 12/13/2021= Will issue next visit 12/6: 4; 06/06/2022= Will assess next visit   Time 12    Period Weeks    Status ONGOING   Target date 08/29/2022     PT LONG TERM GOAL #  5   Title Patient will perform sit to stand transfer with  appropriate AD and max assist of 2 people with 75% consistency to prepare for pregait activities.    Baseline 09/15/2021= Patient unable to stand well- unable to clear his bottom off chair with Max assist of 2 persons. 12/13/2021- Goal not appropriate to try yet but will keep and roll over to next cert as shift continues to focus on transfers/standing   Time 12    Period Weeks    Status Goal still not appropriate for now but will keep active for future   Target date    PT LONG TERM GOAL #6  Title Patient will tolerate sitting unsupported demonstrating erect sitting posture for 30 minutes with CGA to demonstrate improved back extensor strength and improved sitting tolerance.   Baseline 12/13/2021= Patient demonstrated unsupported sitting at edge of mat for approx 20 min;  12/29/2021- Patient performed approx 30 min of dynamic sitting activities today. 06/06/2022= Patient demonstrated ability to sit and perform static and dynamic UE/LE movement with only Supervision.   Time 12   Period Weeks   Status GOAL MET         7.  Patient will tolerate 5 minutes or more of standing in sit to stand lift in order to indicate improved lower extremity weightbearing tolerance for progression to standing in parallel bars. Baseline: 1 minute on most recent stand 02/21/22; 06/06/2022- Patient did attempt today after complaining of right LE pain last week. He attempted 3 stands using sit to stand lift. - all over 1 min- last one approx 45min 48 sec- stopped due to fatigue. More erect standing in lift today - still poor gluteal strength but able to activate glutes and extend hips upon command but unable to hold > 5 sec.  Goal status: ONGOING- Progressing Target date: 08/29/2022  8. Patient will tolerate sitting unsupported demonstrating ability to perform dynamic UE/LE activities for 30 minutes  independently to demonstrate ability to sit at edge of bed to eat or perform some ADL's/exercise for optimal quality of  life.  Baseline: 06/06/2022- Patient able to sit and perform some UE/LE exercises but requires CGA at times for safety- he is able to static sit for 30 min with supervision.   Goal Status: NEW  Target date: 08/29/2022       Plan     Clinical Impression Statement Treatment focused on sitting balance- static and dynamic while performing therex for Upper body and lower body strengthening- Patient gaining confidence in his sitting ability and able to sit for short period of time independently. He was also able to tolerate > 30 of siting today without requesting to stop due to fatigue.  Pt will continue to benefit from skilled physical therapy intervention to address impairments, improve QOL, and attain therapy goals.    Personal Factors and Comorbidities Comorbidity 3+;Time since onset of injury/illness/exacerbation    Comorbidities CVA, diabetes, Seizures    Examination-Activity Limitations Bathing;Bed Mobility;Bend;Caring for Others;Carry;Dressing;Hygiene/Grooming;Lift;Locomotion Level;Reach Overhead;Self Feeding;Sit;Squat;Stairs;Stand;Transfers;Toileting    Examination-Participation Restrictions Cleaning;Community Activity;Driving;Laundry;Medication Management;Meal Prep;Occupation;Personal Finances;Shop;Yard Work;Volunteer    Stability/Clinical Decision Making Evolving/Moderate complexity    Rehab Potential Fair    PT Frequency 2x / week    PT Duration 12 weeks    PT Treatment/Interventions ADLs/Self Care Home Management;Cryotherapy;Electrical Stimulation;Moist Heat;Ultrasound;DME Instruction;Gait training;Stair training;Functional mobility training;Therapeutic exercise;Balance training;Patient/family education;Orthotic Fit/Training;Neuromuscular re-education;Wheelchair mobility training;Manual techniques;Passive range of motion;Dry needling;Energy conservation;Taping;Visual/perceptual remediation/compensation;Joint Manipulations    PT Next Visit Plan core strength- sitting at EOM, postural control,  sit to stand if able; Continue with progressive LE Strengthening.    PT Home Exercise Plan No changes to HEP today    Consulted and Agree with Plan of Care Patient;Family member/caregiver    Family Member Consulted Mom             Ollen Bowl, PT Physical Therapist- [facility]  06/22/2022, 5:05 PM

## 2022-06-22 NOTE — Therapy (Signed)
Occupational Therapy Treatment Note  Patient Name: Paul Hall MRN: [id number] DOB:[birth year], 27 y.o., male Today's Date: 06/22/2022  PCP: Dr. Gerarda Fraction REFERRING PROVIDER: Dr. Gerarda Fraction   OT End of Session - 06/22/22 2106     Visit Number 25    Number of Visits 36    Date for OT Re-Evaluation 08/17/22    OT Start Time 1652    OT Stop Time 1730    OT Time Calculation (min) 38 min    Activity Tolerance Patient tolerated treatment well    Behavior During Therapy WFL for tasks assessed/performed                     Past Medical History:  Diagnosis Date   Diabetes mellitus ([place])    Hypertension    Stroke [facility])    Past Surgical History:  Procedure Laterality Date   IVC FILTER PLACEMENT ([place] [facility])     LEG SURGERY     PEG TUBE PLACEMENT     TRACHEOSTOMY     Patient Active Problem List   Diagnosis Date Noted   Sepsis due to vancomycin resistant Enterococcus species ([place]) 06/06/2019   SIRS (systemic inflammatory response syndrome) ([place]) 06/05/2019   Acute lower UTI 06/05/2019   VRE (vancomycin-resistant Enterococci) infection 06/05/2019   Anemia 06/05/2019   Skin ulcer of sacrum with necrosis of muscle ([place])    Urinary retention    Type 2 diabetes mellitus without complication, with long-term current use of insulin ([facility])    Tachycardia    Lower extremity edema    Acute metabolic encephalopathy    Obstructive sleep apnea    Morbid obesity with BMI of 60.0-69.9, adult ([place])    Goals of care, counseling/discussion    Palliative care encounter    Sepsis ([place]) 04/27/2019   H/O insulin dependent diabetes mellitus 04/27/2019   History of CVA with residual deficit 04/27/2019   Seizure disorder ([place]) 04/27/2019   Decubitus ulcer of sacral region, stage 4 ([place]) 04/27/2019    ONSET DATE: 01/2019  REFERRING DIAG: CVA/COVID-19  THERAPY DIAG:  Muscle weakness (generalized)  Other lack of coordination  Rationale for Evaluation and Treatment  Rehabilitation  SUBJECTIVE:   SUBJECTIVE STATEMENT:  Pt. reports working on weightbearing through his arms during trunk/core work in PT.  Pt accompanied by: self and family member  PERTINENT HISTORY:  Pt. is a 27 y.o. male who was diagnosed with COVID-19, and CVA with resultant quadriplegia. Pt. Was then hospitalized with VRE UTI. PMHx includes: urinary retention, seizure disorder, obstructive sleep disorder, DM Type II, Morbid obesity.   PRECAUTIONS: None  WEIGHT BEARING RESTRICTIONS No  PAIN:  Are you having pain? No-soreness in right forearm from recent Botox.  FALLS: Has patient fallen in last 6 months? No  LIVING ENVIRONMENT: Lives with: lives with their family Lives in: House/apartment Stairs: No Level Entry Has following equipment at home: Wheelchair (power) and hoyer lift, sit to stand lift  PLOF: Independent  PATIENT GOALS: To be able to engage in more daily care tasks.  OBJECTIVE:   HAND DOMINANCE: Right  ADLs:  Transfers/ambulation related to ADLs: Eating: Pt. reports being able to hold standard utensils, and is starting to engage more in self-feeding tasks, hand to mouth patterns. Pt. Reports that he does as much as he can with the task, and family assists  with the remainder of the task. Grooming: Pt. Is able to initiate holding an electric toothbrush, and brush his teeth. Family assists LB Dressing: Total Assist  UE dressing: Pt. is now able to reach up to actively assist with grasping , and pulling his gown down. Toileting:  Total Assist Bathing: MaxA UB, Total assist LB Tub Shower transfers: N/A Equipment: See above    IADLs: Shopping: Relies on family to assist Light housekeeping: Total Assist Meal Prep: Total Assist Community mobility:   Medication management:  Total Assist  Financial management: N/A Handwriting: Not legible: Pt. Is able to hold a pen with the left hand, and initiate marking the page. Pt.'s eye glasses were not  available  MOBILITY STATUS:  Power w/c  POSTURE COMMENTS:  Pt. Requires position changes in his power w/c  ACTIVITY TOLERANCE: Activity tolerance:  Fair  FUNCTIONAL OUTCOME MEASURES: FOTO: 40  TR score: 45  05/25/2022:   FOTO: 50 TR score: 45   UPPER EXTREMITY ROM     Active ROM Right eval Right 03/21/2022 Right 05/25/2022  Left eval Left  03/21/2022 Left 05/25/2022  Shoulder flexion 106 scaption 105  Scaption 62 flexion 110 scaption  119 123 125  Shoulder abduction 114 90 97  110 115 115  Shoulder adduction         Shoulder extension         Shoulder internal rotation         Shoulder external rotation         Elbow flexion 120(130) 145 145  135(135) 145 145  Elbow extension -45(-35) -22(-35) -28(-22)  -27(-18) -21(-20) -20(-16)  Wrist flexion         Wrist extension -30(10) -25(10) -10(30)  10(50) 19(50) 28(50)  Wrist ulnar deviation         Wrist radial deviation         Wrist pronation         Wrist supination   Limited by flexor tone    Limited by flexor tone  (Blank rows = not tested)      UPPER EXTREMITY MMT:     Right eval Right 03/21/2022 Right  05/25/2022  Left eval Left 03/21/2022 Left  05/25/2022  Shoulder flexion 3-/5 scaption 3-/5 3-/5  3-/5 3/5 3+/5  Shoulder abduction 3-/5 3-/5 3-/5  3-/5 3-/5 4-/5  Shoulder adduction         Shoulder extension         Shoulder internal rotation         Shoulder external rotation         Elbow flexion 3/5 3/5 3+/5  3/5 3+/5 4-/5  Elbow extension 2-/5 2/5 3-/5  2-/5 2/5   Wrist flexion         Wrist extension 2-/5 2-/5 2/5  2/5 2+/5 3-/5  Wrist ulnar deviation         Wrist radial deviation         Wrist pronation         Wrist supination         (Blank rows = not tested)     HAND FUNCTION:  Grip strength: Right: 0 lbs; Left: 0 lbs and Lateral pinch: Right: 5 lbs, Left: 2 lbs  03/21/2022: Lateral pinch: Right: 3.5 lbs, Left: 2 lbs  Bilateral digit PIP/DIP flexion contractures with MP  hyperextension with attempts for AROM. Pt. is able to tolerate AROM to the bilateral digits at the initial evaluation however, has a history of pain in the digits.  COORDINATION:  Eval: Pt. is unable to grasp 9-hole test pegs. Pt. is able to initiate grasping larger pegs, and is able to hold a  pen in the left hand.  SENSATION: Light touch: Impaired   EDEMA:  N/A  MUSCLE TONE: BUE flexor Spasticity  COGNITIO Overall cognitive status: Continue to assess in functional context  VISION:   Subjective report: Pt. was not wearing glasses at the time of the initial eval.  Baseline vision: Vision is very limited. Wears glasses all the time Visual history: History of impaired vision following CVA. Pt. Has received treatment through the [facility] low vision rehabilitation program.   VISION ASSESSMENT: Impaired To be further assessed in functional context  PERCEPTION: Impaired   PRAXIS: Impaired: motor planning  OBSERVATIONS:  Pt reports being on Tramadol   TODAY'S TREATMENT:   Ther. Ex.:  Pt. tolerated PROM/AAROM to the bilateral digit MP, IP, and DIP flexion, and extension, thumb abduction.  Neuromuscular re-education:  Pt. worked on bilateral hand coordination skills with emphasis, and focus on  zippers and zipping in horizontal, and vertical planes. Pt. Simulated zipping a lightweight windbreaker athletic jacket using an adapted attachment to the zipper for a more secure grasp while zipping. Pt. worked on placing items into a pant pocket while holding the pocket open with the opposite hand.  Pt. worked on grasping and pulling tabs laterally.    PATIENT EDUCATION: Education details: There. Margorie John Person educated: Patient and Parent Education method: Explanation, Demonstration, Tactile cues, and Verbal cues Education comprehension: verbalized understanding, returned demonstration, verbal cues required, and needs further education   HOME EXERCISE PROGRAM:   Continue  ongoing assessment, and continue to provide as needed.     GOALS: Goals reviewed with patient? Yes  SHORT TERM GOALS: Target date:05/03/2022     To assess splint fit, and make appropriate adjustments to promote good skin integrity through the palmar surface of the bilateral hands.  Baseline: 05/25/22: Goal currently met, however ongoing as needs to assess splint fit arise. 03/23/2022: Pt. is wearing splints a couple of hours at night bilateral resting hand splints. 03/21/2022: Pt. is wearing splints a couple of hours at night bilateral resting hand splints. Goal status: Continue ongoing assessment as requested, and as needed   LONG TERM GOALS: Target date: 06/13/2022     FOTO score will Improve by 2 points for Pt. perceived improvement with the assessment specific ADL/IADL tasks.  Baseline: 05/25/2022: FOTO score: 50,  TR score: 45 Eval: FOTO score: 40,  TR score: 45 Goal status: Achieved   2.   Pt. will independently perform oral care for 100% of the task after complete set-up. Baseline: 05/25/2022:  Pt. Is able to initiate and perform oral care for approximately 90% of the task. Complete set-up required. Assi needed only for the very back teeth. 03/23/2022: Pt. Is able to initiate and perform oral care for approximately 75% of the task. Pt. Requires assist at proximally at the elbow for through oral care. 03/21/2022: Pt. Is able to initiate and perform oral care for approximately 75% of the task. Pt. Requires assist at proximally at the elbow for through oral care. Eval: Pt. is able to initiate using an electric toothbrush. Pt. requires assist for set-up, and assist for thoroughness, and as he Pt. fatigues. Goal status: Ongoing  3.  Pt. Will be independent with self-feeding for 100% of the meal after complete set-up Baseline: 05/25/2022: Pt. Is able to use a spoon to scoop cereal when feeding himself cereal 85% of the time. Pt. Is able to feed himself snack/finger foods 100% of the time. Pt.  Continues to work on consistency of  stabilizing a  cup/mug when drinking. Pt. Is able to grasp a water bottle with assist initially, with assist tapering off as he drinks.03/23/2022: Pt. Is able to perform scooping cereal for 75% of the time. Pt. required assist, and support at the left elbow, and Pt. Presents with limited forearm supination when using the spoon, and bringing it towards his mouth. Pt. Is able to use a fork to spear items, and perform the hand to mouth pattern.  03/21/2022: Pt. Is able to perform scooping cereal for 75% of the time. Pt. required assist, and support at the left elbow, and Pt. presents with limited forearm supination when using the spoon, and bringing it towards his mouth. Pt. Is able to use a fork to spear items, and perform the hand to mouth pattern.  Eval: Pt. is able to hold standard standard utensils. Pt. Performs as much of the task as he, can and has assistance for the remainder. Goal status: Ongoing  4.  Pt. Will improve grasp patterns and consistently grasp 1/2" objects for ADL, and IADL tasks.  Baseline:05/25/2022: Pt. Is working on improving consistency of grasping 1/2" objects with visual cues.  03/23/2022: Pt. Is able to grasp 1" objects consistently,and continues to work on the hand patterns needed to grasp 1/2" objects.03/21/2022: Pt. Is able to grasp 1" objects consistently,and continues to work on the hand patterns needed to grasp 1/2" objects. Eval: Pt. is able to grasp 1" objects intermittently using a lateral grasp pattern. Goal status: Ongoing  5.  Pt. will independently write his name legibly with letter sizes under 2". Baseline: 05/25/2022: Pt. to continue to work towards formulating grasp patterns in preparation for grasping a large width pen. Pt. Requires visual cues. 03/23/2022: Pt. Is able to write his name with modA, however has difficulty with formulating letter sizes less than 2" in size with 50% legibility for the 3 letters of his name.03/21/2022:  Pt. Is able to write his name with modA, however has difficulty with formulating letter sizes less than 2" in size with 50% legibility for the 3 letters of his name. Eval: Pt is able to hold a thin marker with his left hand, and formulate a line, and initiate a circular pattern (Pt. without glasses today) Goal status: Ongoing  6. Pt. Will reach up to comb/brush his hair  with minA.  Baseline: 05/25/2022: Pt. Is able to reach up with the left hand to the left side, top, and back of his head. 03/23/2022: Pt. is now able to more consistently initiate reaching up to his head with his left hand in preparation for haircare12/18/2023: Pt. is now able to more consistently initiate reaching up to his head with his left hand in preparation for haircare. Eval: Pt. is able to initiate reaching up for hair care with a long handled brush, however is unable to sustain UE's in elevation to perform the task.     Goal status: Ongoing  7. Pt. Will independently navigate the w/c through his environment with minA with visual scanning, and hand placement on the controls.  Baseline: 05/25/2022: Pt. Requires minA  and  cues to navigate the w/c in wide spaces, and requires Mod cues to navigate the w/c through more narrow doorways, and tighter turns. Pt. Requires max cues for scanning through the environment, and moderate cues for hand placement on the controls.     Goal status:Ongoing       ASSESSMENT:  CLINICAL IMPRESSION:  Pt. required cues, and increased assist to maintain hand placement on the  plastic bottle today. Pt. required step-by-step visual cues, and hand over hand assist for form, and technique discussed using a cup for water similar to the cup the Pt. Currently uses independently at home. Pt. was able to more securely hold a zipper firmly while zipping when using a large adapted handle for the zipper on the jacket. Pt. required fewer cues to stabilize the surface with the right hand, while working with the  left hand. Pt. worked on zipping, and unzipping in multiple directions with increased time. Pt. continues to work on improving BUE functioning, w/c navigation, and visual compensatory strategies to improve, and maximize independence with ADLs, and IADLs.     PERFORMANCE DEFICITS in functional skills including ADLs, IADLs, coordination, dexterity, proprioception, ROM, strength, pain, [place], G[facility], decreased knowledge of use of DME, and UE functional use, cognitive skills including safety awareness, and psychosocial skills including coping strategies, environmental adaptation, habits, and routines and behaviors.   IMPAIRMENTS are limiting patient from ADLs, IADLs, leisure, and social participation.   COMORBIDITIES may have co-morbidities  that affects occupational performance. Patient will benefit from skilled OT to address above impairments and improve overall function.  MODIFICATION OR ASSISTANCE TO COMPLETE EVALUATION: Maximum or significant modification of tasks or assist is necessary to complete an evaluation.  OT OCCUPATIONAL PROFILE AND HISTORY: Comprehensive assessment: Review of records and extensive additional review of physical, cognitive, psychosocial history related to current functional performance.  CLINICAL DECISION MAKING: High - multiple treatment options, significant modification of task necessary  REHAB POTENTIAL: Fair    EVALUATION COMPLEXITY: High    PLAN: OT FREQUENCY: 2 x's a week  OT DURATION:12 weeks  PLANNED INTERVENTIONS: self care/ADL training, therapeutic exercise, therapeutic activity, neuromuscular re-education, manual therapy, passive range of motion, patient/family education, and cognitive remediation/compensation  RECOMMENDED OTHER SERVICES: PT  CONSULTED AND AGREED WITH PLAN OF CARE: Patient and family member/caregiver  PLAN FOR NEXT SESSION: Initiate treatment   Harrel Carina, MS, OTR/L   Harrel Carina, OT 06/22/2022, 9:09 PM

## 2022-06-27 ENCOUNTER — Ambulatory Visit: Payer: BC Managed Care – PPO

## 2022-06-27 ENCOUNTER — Ambulatory Visit: Payer: BC Managed Care – PPO | Admitting: Occupational Therapy

## 2022-06-27 DIAGNOSIS — R278 Other lack of coordination: Secondary | ICD-10-CM

## 2022-06-27 DIAGNOSIS — R2689 Other abnormalities of gait and mobility: Secondary | ICD-10-CM | POA: Diagnosis not present

## 2022-06-27 DIAGNOSIS — R262 Difficulty in walking, not elsewhere classified: Secondary | ICD-10-CM | POA: Diagnosis not present

## 2022-06-27 DIAGNOSIS — M6281 Muscle weakness (generalized): Secondary | ICD-10-CM | POA: Diagnosis not present

## 2022-06-27 DIAGNOSIS — I6930 Unspecified sequelae of cerebral infarction: Secondary | ICD-10-CM

## 2022-06-27 DIAGNOSIS — R269 Unspecified abnormalities of gait and mobility: Secondary | ICD-10-CM

## 2022-06-27 DIAGNOSIS — R2681 Unsteadiness on feet: Secondary | ICD-10-CM

## 2022-06-28 NOTE — Therapy (Signed)
OUTPATIENT PHYSICAL THERAPY TREATMENT NOTE  Patient Name: Paul Hall MRN: [id number] DOB:[birth year], 27 y.o., male Today's Date: 06/28/2022  PCP:  Loistine Chance PROVIDER:  Collene Mares, PA-C   PT End of Session - 06/27/22 1518     Visit Number 94    Number of Visits 114    Date for PT Re-Evaluation 08/29/22    Authorization Type [employer] and [employer]- Need auth past 12th visit    Authorization Time Period 01/04/21-03/29/21; Recert [id number]; Recert [id number]; Recert [id number]    Progress Note Due on Visit 100    PT Start Time 1616    PT Stop Time 1645    PT Time Calculation (min) 29 min    Equipment Utilized During Treatment Gait belt   Hoyer lift   Activity Tolerance Patient tolerated treatment well   queasiness/nausea   Behavior During Therapy WFL for tasks assessed/performed                         Past Medical History:  Diagnosis Date   Diabetes mellitus ([place])    Hypertension    Stroke ([place])    Past Surgical History:  Procedure Laterality Date   IVC FILTER PLACEMENT ([facility])     LEG SURGERY     PEG TUBE PLACEMENT     TRACHEOSTOMY     Patient Active Problem List   Diagnosis Date Noted   Sepsis due to vancomycin resistant Enterococcus species ([place]) 06/06/2019   SIRS (systemic inflammatory response syndrome) ([place]) 06/05/2019   Acute lower UTI 06/05/2019   VRE (vancomycin-resistant Enterococci) infection 06/05/2019   Anemia 06/05/2019   Skin ulcer of sacrum with necrosis of muscle ([place])    Urinary retention    Type 2 diabetes mellitus without complication, with long-term current use of insulin ([facility])    Tachycardia    Lower extremity edema    Acute metabolic encephalopathy    Obstructive sleep apnea    Morbid obesity with BMI of 60.0-69.9, adult ([place])    Goals of care, counseling/discussion    Palliative care encounter    Sepsis ([place]) 04/27/2019   H/O insulin dependent diabetes mellitus 04/27/2019    History of CVA with residual deficit 04/27/2019   Seizure disorder ([place]) 04/27/2019   Decubitus ulcer of sacral region, stage 4 ([place]) 04/27/2019    REFERRING DIAG: Cerebral infarction, unspecified   THERAPY DIAG:  Muscle weakness (generalized)  Other lack of coordination  Difficulty in walking, not elsewhere classified  Unsteadiness on feet  History of CVA with residual deficit  Abnormality of gait and mobility  Rationale for Evaluation and Treatment Rehabilitation  PERTINENT HISTORY: Paul Hall is a 25yoM who presents with severe weakness, quadriparesis, altered sensorium, and visual impairment s/p critical illness and prolonged hospitalization. Pt hospitalized in October 2020 with ARDS 2/2 COVID19 infection. Pt sustained a complex and lengthy hospitalization which included tracheostomy, prolonged sedation, ECMO. In this period pt sustained CVA and SDH. Pt has now been liberated from tracheostomy and G-tube. Pt has since been hospitalized for wound infection and UTI. Pt lives with parents at home, has hospital bed and left chair, hoyer lift transfers, and power WC for mobility needs. Pt needs heavy physical assistance with ADL 2/2 BUE contractures and motor dysfunction   PRECAUTIONS: Fall  SUBJECTIVE: Patient reports stomach upset today and running late again due to transportation.       Patient reports PAIN:   Are you having pain? No   TODAY'S TREATMENT:  -  THEREX: (seated in power w/c today)    Ankle- DF/PF as active with each Leg as possible- 20 rep each  Active knee flex/ext- x 20 reps - 2 sets   PROM- B knee flex and ext - hold 20 sec x 3  Hip march with 2.5 # 2 sets of 8  reps each.          PATIENT EDUCATION: Education details: standing posture and exercise technique Person educated: Patient Education method: Explanation, Demonstration, Tactile cues, and Verbal cues Education comprehension: verbalized understanding, returned  demonstration, verbal cues required, tactile cues required, and needs further education   HOME EXERCISE PROGRAM:  Access Code: [email or web address] URL: [email or web address] Date: 03/23/2022 Prepared by: Sande Brothers  Exercises - Supine Bridge  - 3 x weekly - 3 sets - 10 reps - 2 hold - Supine Gluteal Sets  - 3 x weekly - 3 sets - 10 reps - 5 sec hold - Supine Quad Set  - 1 x daily - 3 x weekly - 3 sets - 10 reps - 5 hold - Seated Long Arc Quad  - 1 x daily - 7 x weekly - 3 sets - 10 reps - Seated Hip Adduction Squeeze with Ball  - 1 x daily - 3 x weekly - 3 sets - 10 reps - 5 hold - Seated Hip Abduction  - 1 x daily - 3 x weekly - 3 sets - 10 reps - 2 hold    PT Short Term Goals -       PT SHORT TERM GOAL #1   Title Pt will be independent with HEP in order to improve strength and balance in order to decrease fall risk and improve function at home and work.    Baseline 01/04/2021= No formal HEP in place; 12/12 no HEP in place; 05/10/2021-Patient and his father were able to report compliance with curent HEP consisting of mostly seated/reclined LE strengthening. Both verbalize no questions at this time.    Time 6    Period Weeks    Status Achieved    Target Date 02/15/21              PT Long Term Goals - Date for completion: 06/01/2022         PT LONG TERM GOAL #1   Title Patient will increase BLE gross strength by 1/2 muscle grade to improve functional strength for improved independence with potential gait, increased standing tolerance and increased ADL ability.    Baseline 01/04/2021- Patient presents with 1/5 to 3-/5 B LE strength with MMT; 12/12: goal partially met for Left knee/hip; 05/10/2021= 2-/5 bilateal Hip flex; 3+/5 bilateral Knee ext; 06/21/2021= Patient presents with 2-/5 bilateral Hip flex; 3+/5 bilateral knee ext/flex; 2-/5 left ankle DF; 0/5 right ankle- and able to increase reps and resistance with LE's. 09/15/2021- Patient technically presents with 2-/5 B  hip flex/abd/add - but he is able to raise his hip up to approx 100 deg which has improved. 3+/5 Bilateral knee ext, 2-/5 left ankle and 0/5 right ankle.  12/08/2021= Patient able to lift left knee at 110 deg of hip flex; presents with 3+/5 knee ext, 2-/5 left ankle DF and 0/5 right ankle DF, 2-/5 bilateral Hip abd in seated position.   12/6: R: knee 3+/5 ext, 2/5 flexion, left knee 3+/5 extension, 3+/5 flexion, R hip: 2+/5 hip add, 2+/5 hip ABD L hip: 4-/5 hip ABD, 3+/5 hip ADD, 3+/5 hip flexion; 06/06/2022= Patient now presents with 2-/5 right ankle DF/PF  Time 12    Period Weeks    Status MET   Target Date 03/07/2022     PT LONG TERM GOAL #2   Title Patient will tolerate sitting unsupported demonstrating erect sitting posture for 15 minutes with CGA to demonstrate improved back extensor strength and improved sitting tolerance.    Baseline 01/04/2021- Patient confied to sitting in lift chair or electric power chair with back support and unable to sit upright without physical assistance; 12/12: tolerates <1 minutes upright unsupported sitting. 05/10/2021=static sit with forward trunk lean  in his power wheelchair without back support x approx 3 min. 06/21/2021=Unable to assess today due to patient with acute back pain but on previous visit able to sit x 8 min without back support. 09/15/2021- on last visit- 09/13/2021- patient was able to sit unsupported x 8 min at edge of mat. 10/13/2023 - Patient was able to sit at edge of mat with varying level of assist today from SBA to min A for a total of 20 min. 12/13/2021= Patient demonstrated unsupported sitting at edge of mat for approx 20 min   Time 12    Period Weeks    Status GOAL MET   Target Date 12/08/21      PT LONG TERM GOAL #3   Title Patient will demonstrate ability to perform static standing in // bars > 2 min with Max Assist  without loss of balance and fair posture for improved overall strength for pre-gait and transfer activities.    Baseline  01/04/2021= Patient current uanble to stand- Dependent on hoyer or sit to stand lift for transfers. 05/10/2021=Not appropriate yet- Currently still dependent with all transfers using hoyer. 06/21/2021= Patient continuing now to focus on LE strengthening to prepare for standing-unable to try today due to acute low back pain-  planning on attempting in new cert period. 09/15/2021- Patient has attempted standing 2x in past two week- max Assist of 2 people - only once was he successful to clearing his bottom from chair - Will continue to be a focus during the new certification. 12/13/2021= Patient has been limited secondary to increased overall low back pain during this certification and will require more time to focus on this goal.  12/6: not assessed this date, will assess at date when 2-3 PTs are present for assistance    Time 12    Period Weeks    Status GOAL not appropriate at this time - may attempt in future once Patient presents with improved overall LE strength.          PT LONG TERM GOAL #4   Title Pt will improve FOTO score by 10 points or more demonstrating improved perceived functional ability    Baseline FOTO 7 on 10/17; 03/15/21: FOTO 12; 05/10/2021 06/21/2021= 1; 09/15/2021= 9; 12/13/2021= Will issue next visit 12/6: 4; 06/06/2022= Will assess next visit   Time 12    Period Weeks    Status ONGOING   Target date 08/29/2022     PT LONG TERM GOAL #5   Title Patient will perform sit to stand transfer with appropriate AD and max assist of 2 people with 75% consistency to prepare for pregait activities.    Baseline 09/15/2021= Patient unable to stand well- unable to clear his bottom off chair with Max assist of 2 persons. 12/13/2021- Goal not appropriate to try yet but will keep and roll over to next cert as shift continues to focus on transfers/standing   Time 12    Period Weeks  Status Goal still not appropriate for now but will keep active for future   Target date    PT LONG TERM GOAL #6  Title  Patient will tolerate sitting unsupported demonstrating erect sitting posture for 30 minutes with CGA to demonstrate improved back extensor strength and improved sitting tolerance.   Baseline 12/13/2021= Patient demonstrated unsupported sitting at edge of mat for approx 20 min;  12/29/2021- Patient performed approx 30 min of dynamic sitting activities today. 06/06/2022= Patient demonstrated ability to sit and perform static and dynamic UE/LE movement with only Supervision.   Time 12   Period Weeks   Status GOAL MET         7.  Patient will tolerate 5 minutes or more of standing in sit to stand lift in order to indicate improved lower extremity weightbearing tolerance for progression to standing in parallel bars. Baseline: 1 minute on most recent stand 02/21/22; 06/06/2022- Patient did attempt today after complaining of right LE pain last week. He attempted 3 stands using sit to stand lift. - all over 1 min- last one approx 101min 48 sec- stopped due to fatigue. More erect standing in lift today - still poor gluteal strength but able to activate glutes and extend hips upon command but unable to hold > 5 sec.  Goal status: ONGOING- Progressing Target date: 08/29/2022  8. Patient will tolerate sitting unsupported demonstrating ability to perform dynamic UE/LE activities for 30 minutes  independently to demonstrate ability to sit at edge of bed to eat or perform some ADL's/exercise for optimal quality of life.  Baseline: 06/06/2022- Patient able to sit and perform some UE/LE exercises but requires CGA at times for safety- he is able to static sit for 30 min with supervision.   Goal Status: NEW  Target date: 08/29/2022       Plan     Clinical Impression Statement Treatment limited secondary to patient not feeling well and late today. He was abel to perform some LE with more resistance despite not feeling well- continues to require direction and assist with LE strengthening activities.  Pt will continue to  benefit from skilled physical therapy intervention to address impairments, improve QOL, and attain therapy goals.    Personal Factors and Comorbidities Comorbidity 3+;Time since onset of injury/illness/exacerbation    Comorbidities CVA, diabetes, Seizures    Examination-Activity Limitations Bathing;Bed Mobility;Bend;Caring for Others;Carry;Dressing;Hygiene/Grooming;Lift;Locomotion Level;Reach Overhead;Self Feeding;Sit;Squat;Stairs;Stand;Transfers;Toileting    Examination-Participation Restrictions Cleaning;Community Activity;Driving;Laundry;Medication Management;Meal Prep;Occupation;Personal Finances;Shop;Yard Work;Volunteer    Stability/Clinical Decision Making Evolving/Moderate complexity    Rehab Potential Fair    PT Frequency 2x / week    PT Duration 12 weeks    PT Treatment/Interventions ADLs/Self Care Home Management;Cryotherapy;Electrical Stimulation;Moist Heat;Ultrasound;DME Instruction;Gait training;Stair training;Functional mobility training;Therapeutic exercise;Balance training;Patient/family education;Orthotic Fit/Training;Neuromuscular re-education;Wheelchair mobility training;Manual techniques;Passive range of motion;Dry needling;Energy conservation;Taping;Visual/perceptual remediation/compensation;Joint Manipulations    PT Next Visit Plan core strength- sitting at EOM, postural control, sit to stand if able; Continue with progressive LE Strengthening.    PT Home Exercise Plan No changes to HEP today    Consulted and Agree with Plan of Care Patient;Family member/caregiver    Family Member Consulted Mom             Ollen Bowl, PT Physical Therapist- [facility]  06/28/2022, 3:19 PM

## 2022-06-28 NOTE — Therapy (Signed)
Occupational Therapy Treatment Note  Patient Name: Paul Hall MRN: [id number] DOB:02/05/1996, 27 y.o., male Today's Date: 06/28/2022  PCP: Dr. Gerarda Fraction REFERRING PROVIDER: Dr. Gerarda Fraction   OT End of Session - 06/28/22 1050     Visit Number 26    Number of Visits 85    Date for OT Re-Evaluation 08/17/22    Authorization Type Progress report period starting 10/06/2021    OT Start Time 1650    OT Stop Time 1730    OT Time Calculation (min) 40 min    Equipment Utilized During Treatment tilt in space power wc    Activity Tolerance Patient tolerated treatment well    Behavior During Therapy WFL for tasks assessed/performed                     Past Medical History:  Diagnosis Date   Diabetes mellitus ([place])    Hypertension    Stroke [facility])    Past Surgical History:  Procedure Laterality Date   IVC FILTER PLACEMENT ([place] [facility])     LEG SURGERY     PEG TUBE PLACEMENT     TRACHEOSTOMY     Patient Active Problem List   Diagnosis Date Noted   Sepsis due to vancomycin resistant Enterococcus species ([place]) 06/06/2019   SIRS (systemic inflammatory response syndrome) ([place]) 06/05/2019   Acute lower UTI 06/05/2019   VRE (vancomycin-resistant Enterococci) infection 06/05/2019   Anemia 06/05/2019   Skin ulcer of sacrum with necrosis of muscle ([place])    Urinary retention    Type 2 diabetes mellitus without complication, with long-term current use of insulin ([facility])    Tachycardia    Lower extremity edema    Acute metabolic encephalopathy    Obstructive sleep apnea    Morbid obesity with BMI of 60.0-69.9, adult ([place])    Goals of care, counseling/discussion    Palliative care encounter    Sepsis ([place]) 04/27/2019   H/O insulin dependent diabetes mellitus 04/27/2019   History of CVA with residual deficit 04/27/2019   Seizure disorder ([place]) 04/27/2019   Decubitus ulcer of sacral region, stage 4 ([place]) 04/27/2019    ONSET DATE: 01/2019  REFERRING DIAG: CVA/COVID-19  THERAPY  DIAG:  Muscle weakness (generalized)  Other lack of coordination  Rationale for Evaluation and Treatment Rehabilitation  SUBJECTIVE:   SUBJECTIVE STATEMENT:  Pt./caregiver report that the Pt. was discouraged with grasp patterns when using his spoon this weekend.  Pt accompanied by: self and family member  PERTINENT HISTORY:  Pt. is a 27 y.o. male who was diagnosed with COVID-19, and CVA with resultant quadriplegia. Pt. Was then hospitalized with VRE UTI. PMHx includes: urinary retention, seizure disorder, obstructive sleep disorder, DM Type II, Morbid obesity.   PRECAUTIONS: None  WEIGHT BEARING RESTRICTIONS No  PAIN:  Are you having pain? No-soreness in right forearm from recent Botox.  FALLS: Has patient fallen in last 6 months? No  LIVING ENVIRONMENT: Lives with: lives with their family Lives in: House/apartment Stairs: No Level Entry Has following equipment at home: Wheelchair (power) and hoyer lift, sit to stand lift  PLOF: Independent  PATIENT GOALS: To be able to engage in more daily care tasks.  OBJECTIVE:   HAND DOMINANCE: Right  ADLs:  Transfers/ambulation related to ADLs: Eating: Pt. reports being able to hold standard utensils, and is starting to engage more in self-feeding tasks, hand to mouth patterns. Pt. Reports that he does as much as he can with the task, and family assists  with  the remainder of the task. Grooming: Pt. Is able to initiate holding an electric toothbrush, and brush his teeth. Family assists LB Dressing: Total Assist UE dressing: Pt. is now able to reach up to actively assist with grasping , and pulling his gown down. Toileting:  Total Assist Bathing: MaxA UB, Total assist LB Tub Shower transfers: N/A Equipment: See above    IADLs: Shopping: Relies on family to assist Light housekeeping: Total Assist Meal Prep: Total Assist Community mobility:   Medication management:  Total Assist  Financial management: N/A Handwriting:  Not legible: Pt. Is able to hold a pen with the left hand, and initiate marking the page. Pt.'s eye glasses were not available  MOBILITY STATUS:  Power w/c  POSTURE COMMENTS:  Pt. Requires position changes in his power w/c  ACTIVITY TOLERANCE: Activity tolerance:  Fair  FUNCTIONAL OUTCOME MEASURES: FOTO: 40  TR score: 45  05/25/2022:   FOTO: 50 TR score: 45   UPPER EXTREMITY ROM     Active ROM Right eval Right 03/21/2022 Right 05/25/2022  Left eval Left  03/21/2022 Left 05/25/2022  Shoulder flexion 106 scaption 105  Scaption 62 flexion 110 scaption  119 123 125  Shoulder abduction 114 90 97  110 115 115  Shoulder adduction         Shoulder extension         Shoulder internal rotation         Shoulder external rotation         Elbow flexion 120(130) 145 145  135(135) 145 145  Elbow extension -45(-35) -22(-35) -28(-22)  -27(-18) -21(-20) -20(-16)  Wrist flexion         Wrist extension -30(10) -25(10) -10(30)  10(50) 19(50) 28(50)  Wrist ulnar deviation         Wrist radial deviation         Wrist pronation         Wrist supination   Limited by flexor tone    Limited by flexor tone  (Blank rows = not tested)      UPPER EXTREMITY MMT:     Right eval Right 03/21/2022 Right  05/25/2022  Left eval Left 03/21/2022 Left  05/25/2022  Shoulder flexion 3-/5 scaption 3-/5 3-/5  3-/5 3/5 3+/5  Shoulder abduction 3-/5 3-/5 3-/5  3-/5 3-/5 4-/5  Shoulder adduction         Shoulder extension         Shoulder internal rotation         Shoulder external rotation         Elbow flexion 3/5 3/5 3+/5  3/5 3+/5 4-/5  Elbow extension 2-/5 2/5 3-/5  2-/5 2/5   Wrist flexion         Wrist extension 2-/5 2-/5 2/5  2/5 2+/5 3-/5  Wrist ulnar deviation         Wrist radial deviation         Wrist pronation         Wrist supination         (Blank rows = not tested)     HAND FUNCTION:  Grip strength: Right: 0 lbs; Left: 0 lbs and Lateral pinch: Right: 5 lbs, Left: 2  lbs  03/21/2022: Lateral pinch: Right: 3.5 lbs, Left: 2 lbs  Bilateral digit PIP/DIP flexion contractures with MP hyperextension with attempts for AROM. Pt. is able to tolerate AROM to the bilateral digits at the initial evaluation however, has a history of pain in the digits.  COORDINATION:  Eval: Pt. is unable to grasp 9-hole test pegs. Pt. is able to initiate grasping larger pegs, and is able to hold a pen in the left hand.  SENSATION: Light touch: Impaired   EDEMA:  N/A  MUSCLE TONE: BUE flexor Spasticity  COGNITIO Overall cognitive status: Continue to assess in functional context  VISION:   Subjective report: Pt. was not wearing glasses at the time of the initial eval.  Baseline vision: Vision is very limited. Wears glasses all the time Visual history: History of impaired vision following CVA. Pt. Has received [facility] vision [facility] program.   VISION ASSESSMENT: Impaired To be further assessed in functional context  PERCEPTION: Impaired   PRAXIS: Impaired: motor planning  OBSERVATIONS:  Pt reports being on Tramadol   TODAY'S TREATMENT:   Neuromuscular re-education:  Pt. worked on bilateral hand coordination skills with emphasis, and focus on  zippers and zipping in horizontal, and vertical planes. Pt. Simulated zipping a heavy resistive zipper horizontally at the table, and vertically. An adapted attachment to the zipper for a more secure grasp while zipping. Pt. worked on bilateral hand coordination for tying with step-by-step verbal, and visual cues.     PATIENT EDUCATION: Education details: There. Margorie John Person educated: Patient and Parent Education method: Explanation, Demonstration, Tactile cues, and Verbal cues Education comprehension: verbalized understanding, returned demonstration, verbal cues required, and needs further education   HOME EXERCISE PROGRAM:   Continue ongoing assessment, and continue to provide as  needed.     GOALS: Goals reviewed with patient? Yes  SHORT TERM GOALS: Target date:05/03/2022     To assess splint fit, and make appropriate adjustments to promote good skin integrity through the palmar surface of the bilateral hands.  Baseline: 05/25/22: Goal currently met, however ongoing as needs to assess splint fit arise. 03/23/2022: Pt. is wearing splints a couple of hours at night bilateral resting hand splints. 03/21/2022: Pt. is wearing splints a couple of hours at night bilateral resting hand splints. Goal status: Continue ongoing assessment as requested, and as needed   LONG TERM GOALS: Target date: 06/13/2022     FOTO score will Improve by 2 points for Pt. perceived improvement with the assessment specific ADL/IADL tasks.  Baseline: 05/25/2022: FOTO score: 50,  TR score: 45 Eval: FOTO score: 40,  TR score: 45 Goal status: Achieved   2.   Pt. will independently perform oral care for 100% of the task after complete set-up. Baseline: 05/25/2022:  Pt. Is able to initiate and perform oral care for approximately 90% of the task. Complete set-up required. Assi needed only for the very back teeth. 03/23/2022: Pt. Is able to initiate and perform oral care for approximately 75% of the task. Pt. Requires assist at proximally at the elbow for through oral care. 03/21/2022: Pt. Is able to initiate and perform oral care for approximately 75% of the task. Pt. Requires assist at proximally at the elbow for through oral care. Eval: Pt. is able to initiate using an electric toothbrush. Pt. requires assist for set-up, and assist for thoroughness, and as he Pt. fatigues. Goal status: Ongoing  3.  Pt. Will be independent with self-feeding for 100% of the meal after complete set-up Baseline: 05/25/2022: Pt. Is able to use a spoon to scoop cereal when feeding himself cereal 85% of the time. Pt. Is able to feed himself snack/finger foods 100% of the time. Pt. Continues to work on consistency of  stabilizing  a cup/mug when drinking.  Pt. Is able to grasp a water bottle with assist initially, with assist tapering off as he drinks.03/23/2022: Pt. Is able to perform scooping cereal for 75% of the time. Pt. required assist, and support at the left elbow, and Pt. Presents with limited forearm supination when using the spoon, and bringing it towards his mouth. Pt. Is able to use a fork to spear items, and perform the hand to mouth pattern.  03/21/2022: Pt. Is able to perform scooping cereal for 75% of the time. Pt. required assist, and support at the left elbow, and Pt. presents with limited forearm supination when using the spoon, and bringing it towards his mouth. Pt. Is able to use a fork to spear items, and perform the hand to mouth pattern.  Eval: Pt. is able to hold standard standard utensils. Pt. Performs as much of the task as he, can and has assistance for the remainder. Goal status: Ongoing  4.  Pt. Will improve grasp patterns and consistently grasp 1/2" objects for ADL, and IADL tasks.  Baseline:05/25/2022: Pt. Is working on improving consistency of grasping 1/2" objects with visual cues.  03/23/2022: Pt. Is able to grasp 1" objects consistently,and continues to work on the hand patterns needed to grasp 1/2" objects.03/21/2022: Pt. Is able to grasp 1" objects consistently,and continues to work on the hand patterns needed to grasp 1/2" objects. Eval: Pt. is able to grasp 1" objects intermittently using a lateral grasp pattern. Goal status: Ongoing  5.  Pt. will independently write his name legibly with letter sizes under 2". Baseline: 05/25/2022: Pt. to continue to work towards formulating grasp patterns in preparation for grasping a large width pen. Pt. Requires visual cues. 03/23/2022: Pt. Is able to write his name with modA, however has difficulty with formulating letter sizes less than 2" in size with 50% legibility for the 3 letters of his name.03/21/2022: Pt. Is able to write his name with modA, however  has difficulty with formulating letter sizes less than 2" in size with 50% legibility for the 3 letters of his name. Eval: Pt is able to hold a thin marker with his left hand, and formulate a line, and initiate a circular pattern (Pt. without glasses today) Goal status: Ongoing  6. Pt. Will reach up to comb/brush his hair  with minA.  Baseline: 05/25/2022: Pt. Is able to reach up with the left hand to the left side, top, and back of his head. 03/23/2022: Pt. is now able to more consistently initiate reaching up to his head with his left hand in preparation for haircare12/18/2023: Pt. is now able to more consistently initiate reaching up to his head with his left hand in preparation for haircare. Eval: Pt. is able to initiate reaching up for hair care with a long handled brush, however is unable to sustain UE's in elevation to perform the task.     Goal status: Ongoing  7. Pt. Will independently navigate the w/c through his environment with minA with visual scanning, and hand placement on the controls.  Baseline: 05/25/2022: Pt. Requires minA  and  cues to navigate the w/c in wide spaces, and requires Mod cues to navigate the w/c through more narrow doorways, and tighter turns. Pt. Requires max cues for scanning through the environment, and moderate cues for hand placement on the controls.     Goal status:Ongoing       ASSESSMENT:  CLINICAL IMPRESSION:  Pt. required cues, and increased assist to maintain hand placement on the plastic bottle 2/2  more relaxed 2nd digit PIP flexion with MP hyperextension with thumb IP hyperextension. Pt. required step-by-step visual cues, and hand over hand assist for form, and technique during all tasks. Pt. was able to more securely hold a zipper firmly while zipping when using a smaller adapted handle for the zipper on the jacket. Pt. required fewer cues to stabilize the surface with the right hand, while working with the left hand. Pt. Was able to successfully tie  laces with MaxA, and max cues.  Pt. worked on zipping, and unzipping in multiple directions with increased time. Pt. continues to work on improving BUE functioning, w/c navigation, and visual compensatory strategies to improve, and maximize independence with ADLs, and IADLs.     PERFORMANCE DEFICITS in functional skills including ADLs, IADLs, coordination, dexterity, proprioception, ROM, strength, pain, [place], [facility], decreased knowledge of use of DME, and UE functional use, cognitive skills including safety awareness, and psychosocial skills including coping strategies, environmental adaptation, habits, and routines and behaviors.   IMPAIRMENTS are limiting patient from ADLs, IADLs, leisure, and social participation.   COMORBIDITIES may have co-morbidities  that affects occupational performance. Patient will benefit from skilled OT to address above impairments and improve overall function.  MODIFICATION OR ASSISTANCE TO COMPLETE EVALUATION: Maximum or significant modification of tasks or assist is necessary to complete an evaluation.  OT OCCUPATIONAL PROFILE AND HISTORY: Comprehensive assessment: Review of records and extensive additional review of physical, cognitive, psychosocial history related to current functional performance.  CLINICAL DECISION MAKING: High - multiple treatment options, significant modification of task necessary  REHAB POTENTIAL: Fair    EVALUATION COMPLEXITY: High    PLAN: OT FREQUENCY: 2 x's a week  OT DURATION:12 weeks  PLANNED INTERVENTIONS: self care/ADL training, therapeutic exercise, therapeutic activity, neuromuscular re-education, manual therapy, passive range of motion, patient/family education, and cognitive remediation/compensation  RECOMMENDED OTHER SERVICES: PT  CONSULTED AND AGREED WITH PLAN OF CARE: Patient and family member/caregiver  PLAN FOR NEXT SESSION: Initiate treatment   Harrel Carina, MS, OTR/L   Harrel Carina, OT 06/28/2022,  10:53 AM

## 2022-06-29 ENCOUNTER — Ambulatory Visit: Payer: BC Managed Care – PPO | Admitting: Occupational Therapy

## 2022-06-29 ENCOUNTER — Ambulatory Visit: Payer: BC Managed Care – PPO

## 2022-06-29 DIAGNOSIS — M6281 Muscle weakness (generalized): Secondary | ICD-10-CM

## 2022-06-29 DIAGNOSIS — R269 Unspecified abnormalities of gait and mobility: Secondary | ICD-10-CM

## 2022-06-29 DIAGNOSIS — R278 Other lack of coordination: Secondary | ICD-10-CM

## 2022-06-29 DIAGNOSIS — R2681 Unsteadiness on feet: Secondary | ICD-10-CM

## 2022-06-29 DIAGNOSIS — R2689 Other abnormalities of gait and mobility: Secondary | ICD-10-CM | POA: Diagnosis not present

## 2022-06-29 DIAGNOSIS — I6930 Unspecified sequelae of cerebral infarction: Secondary | ICD-10-CM

## 2022-06-29 DIAGNOSIS — R262 Difficulty in walking, not elsewhere classified: Secondary | ICD-10-CM

## 2022-06-29 NOTE — Therapy (Signed)
OUTPATIENT PHYSICAL THERAPY TREATMENT NOTE  Patient Name: Paul Hall MRN: [id number] DOB:January 17, 1996, 27 y.o., male Today's Date: 06/30/2022  PCP:  Paul Hall PROVIDER:  Collene Mares, PA-C   PT End of Session - 06/29/22 2026     Visit Number 95    Number of Visits 114    Date for PT Re-Evaluation 08/29/22    Authorization Type [employer] and [employer]- Need auth past 12th visit    Authorization Time Period 01/04/21-03/29/21; Recert [id number]; Recert [id number]; Recert [id number]    Progress Note Due on Visit 100    PT Start Time 1602    PT Stop Time [id number]    PT Time Calculation (min) 43 min    Equipment Utilized During Treatment Gait belt   Hoyer lift   Activity Tolerance Patient tolerated treatment well   queasiness/nausea   Behavior During Therapy WFL for tasks assessed/performed                         Past Medical History:  Diagnosis Date   Diabetes mellitus ([place])    Hypertension    Stroke [facility])    Past Surgical History:  Procedure Laterality Date   IVC FILTER PLACEMENT ([place] [facility])     LEG SURGERY     PEG TUBE PLACEMENT     TRACHEOSTOMY     Patient Active Problem List   Diagnosis Date Noted   Sepsis due to vancomycin resistant Enterococcus species ([place]) 06/06/2019   SIRS (systemic inflammatory response syndrome) ([place]) 06/05/2019   Acute lower UTI 06/05/2019   VRE (vancomycin-resistant Enterococci) infection 06/05/2019   Anemia 06/05/2019   Skin ulcer of sacrum with necrosis of muscle ([place])    Urinary retention    Type 2 diabetes mellitus without complication, with long-term current use of insulin ([facility])    Tachycardia    Lower extremity edema    Acute metabolic encephalopathy    Obstructive sleep apnea    Morbid obesity with BMI of 60.0-69.9, adult ([place])    Goals of care, counseling/discussion    Palliative care encounter    Sepsis ([place]) 04/27/2019   H/O insulin dependent diabetes mellitus 04/27/2019    History of CVA with residual deficit 04/27/2019   Seizure disorder ([place]) 04/27/2019   Decubitus ulcer of sacral region, stage 4 ([place]) 04/27/2019    REFERRING DIAG: Cerebral infarction, unspecified   THERAPY DIAG:  Muscle weakness (generalized)  Other lack of coordination  Difficulty in walking, not elsewhere classified  Unsteadiness on feet  History of CVA with residual deficit  Abnormality of gait and mobility  Rationale for Evaluation and Treatment Rehabilitation  PERTINENT HISTORY: Paul Hall is a 25yoM who presents with severe weakness, quadriparesis, altered sensorium, and visual impairment s/p critical illness and prolonged hospitalization. Pt hospitalized in October 2020 with ARDS 2/2 COVID19 infection. Pt sustained a complex and lengthy hospitalization which included tracheostomy, prolonged sedation, ECMO. In this period pt sustained CVA and SDH. Pt has now been liberated from tracheostomy and G-tube. Pt has since been hospitalized for wound infection and UTI. Pt lives with parents at home, has hospital bed and left chair, hoyer lift transfers, and power WC for mobility needs. Pt needs heavy physical assistance with ADL 2/2 BUE contractures and motor dysfunction   PRECAUTIONS: Fall  SUBJECTIVE: Patient reports feeling much better and agreeable to try to stand.   Patient reports PAIN:   Are you having pain? No   TODAY'S TREATMENT:  -  *Dependent transfer  using hoyer to transfer patient from power w/c to mat table- then patient assisted to sitting at [facility] (hoyer pad underneath)    Therapeutic activities:   Sit to stand lift - performed multiple trials of standing today 1) static stand with 1 person on each side providing UE Support at forearms- x 1 min 37 sec  2) Dynamic standing - focusing on performing 10 gluteal sets while standing  (approx 1 min 23 sec)  3) Dynamic standing- Attempting to perform terminal knee ext yet patient with difficulty grasping concept  and tightening glutes with minimal increase quad contraction 4) Static stand- performed with more erect posture x 2 min 40 sec            PATIENT EDUCATION: Education details: standing posture and exercise technique Person educated: Patient Education method: Explanation, Demonstration, Tactile cues, and Verbal cues Education comprehension: verbalized understanding, returned demonstration, verbal cues required, tactile cues required, and needs further education   HOME EXERCISE PROGRAM:  Access Code: [email or web address] URL: [email or web address] Date: 03/23/2022 Prepared by: Paul Hall  Exercises - Supine Bridge  - 3 x weekly - 3 sets - 10 reps - 2 hold - Supine Gluteal Sets  - 3 x weekly - 3 sets - 10 reps - 5 sec hold - Supine Quad Set  - 1 x daily - 3 x weekly - 3 sets - 10 reps - 5 hold - Seated Long Arc Quad  - 1 x daily - 7 x weekly - 3 sets - 10 reps - Seated Hip Adduction Squeeze with Ball  - 1 x daily - 3 x weekly - 3 sets - 10 reps - 5 hold - Seated Hip Abduction  - 1 x daily - 3 x weekly - 3 sets - 10 reps - 2 hold    PT Short Term Goals -       PT SHORT TERM GOAL #1   Title Pt will be independent with HEP in order to improve strength and balance in order to decrease fall risk and improve function at home and work.    Baseline 01/04/2021= No formal HEP in place; 12/12 no HEP in place; 05/10/2021-Patient and his father were able to report compliance with curent HEP consisting of mostly seated/reclined LE strengthening. Both verbalize no questions at this time.    Time 6    Period Weeks    Status Achieved    Target Date 02/15/21              PT Long Term Goals - Date for completion: 06/01/2022         PT LONG TERM GOAL #1   Title Patient will increase BLE gross strength by 1/2 muscle grade to improve functional strength for improved independence with potential gait, increased standing tolerance and increased ADL ability.    Baseline  01/04/2021- Patient presents with 1/5 to 3-/5 B LE strength with MMT; 12/12: goal partially met for Left knee/hip; 05/10/2021= 2-/5 bilateal Hip flex; 3+/5 bilateral Knee ext; 06/21/2021= Patient presents with 2-/5 bilateral Hip flex; 3+/5 bilateral knee ext/flex; 2-/5 left ankle DF; 0/5 right ankle- and able to increase reps and resistance with LE's. 09/15/2021- Patient technically presents with 2-/5 B hip flex/abd/add - but he is able to raise his hip up to approx 100 deg which has improved. 3+/5 Bilateral knee ext, 2-/5 left ankle and 0/5 right ankle.  12/08/2021= Patient able to lift left knee at 110 deg of hip flex; presents with 3+/5 knee ext, 2-/5 left  ankle DF and 0/5 right ankle DF, 2-/5 bilateral Hip abd in seated position.   12/6: R: knee 3+/5 ext, 2/5 flexion, left knee 3+/5 extension, 3+/5 flexion, R hip: 2+/5 hip add, 2+/5 hip ABD L hip: 4-/5 hip ABD, 3+/5 hip ADD, 3+/5 hip flexion; 06/06/2022= Patient now presents with 2-/5 right ankle DF/PF   Time 12    Period Weeks    Status MET   Target Date 03/07/2022     PT LONG TERM GOAL #2   Title Patient will tolerate sitting unsupported demonstrating erect sitting posture for 15 minutes with CGA to demonstrate improved back extensor strength and improved sitting tolerance.    Baseline 01/04/2021- Patient confied to sitting in lift chair or electric power chair with back support and unable to sit upright without physical assistance; 12/12: tolerates <1 minutes upright unsupported sitting. 05/10/2021=static sit with forward trunk lean  in his power wheelchair without back support x approx 3 min. 06/21/2021=Unable to assess today due to patient with acute back pain but on previous visit able to sit x 8 min without back support. 09/15/2021- on last visit- 09/13/2021- patient was able to sit unsupported x 8 min at edge of mat. 10/13/2023 - Patient was able to sit at edge of mat with varying level of assist today from SBA to min A for a total of 20 min. 12/13/2021=  Patient demonstrated unsupported sitting at edge of mat for approx 20 min   Time 12    Period Weeks    Status GOAL MET   Target Date 12/08/21      PT LONG TERM GOAL #3   Title Patient will demonstrate ability to perform static standing in // bars > 2 min with Max Assist  without loss of balance and fair posture for improved overall strength for pre-gait and transfer activities.    Baseline 01/04/2021= Patient current uanble to stand- Dependent on hoyer or sit to stand lift for transfers. 05/10/2021=Not appropriate yet- Currently still dependent with all transfers using hoyer. 06/21/2021= Patient continuing now to focus on LE strengthening to prepare for standing-unable to try today due to acute low back pain-  planning on attempting in new cert period. 09/15/2021- Patient has attempted standing 2x in past two week- max Assist of 2 people - only once was he successful to clearing his bottom from chair - Will continue to be a focus during the new certification. 12/13/2021= Patient has been limited secondary to increased overall low back pain during this certification and will require more time to focus on this goal.  12/6: not assessed this date, will assess at date when 2-3 PTs are present for assistance    Time 12    Period Weeks    Status GOAL not appropriate at this time - may attempt in future once Patient presents with improved overall LE strength.          PT LONG TERM GOAL #4   Title Pt will improve FOTO score by 10 points or more demonstrating improved perceived functional ability    Baseline FOTO 7 on 10/17; 03/15/21: FOTO 12; 05/10/2021 06/21/2021= 1; 09/15/2021= 9; 12/13/2021= Will issue next visit 12/6: 4; 06/06/2022= Will assess next visit   Time 12    Period Weeks    Status ONGOING   Target date 08/29/2022     PT LONG TERM GOAL #5   Title Patient will perform sit to stand transfer with appropriate AD and max assist of 2 people with 75% consistency to  prepare for pregait activities.     Baseline 09/15/2021= Patient unable to stand well- unable to clear his bottom off chair with Max assist of 2 persons. 12/13/2021- Goal not appropriate to try yet but will keep and roll over to next cert as shift continues to focus on transfers/standing   Time 12    Period Weeks    Status Goal still not appropriate for now but will keep active for future   Target date    PT LONG TERM GOAL #6  Title Patient will tolerate sitting unsupported demonstrating erect sitting posture for 30 minutes with CGA to demonstrate improved back extensor strength and improved sitting tolerance.   Baseline 12/13/2021= Patient demonstrated unsupported sitting at edge of mat for approx 20 min;  12/29/2021- Patient performed approx 30 min of dynamic sitting activities today. 06/06/2022= Patient demonstrated ability to sit and perform static and dynamic UE/LE movement with only Supervision.   Time 12   Period Weeks   Status GOAL MET         7.  Patient will tolerate 5 minutes or more of standing in sit to stand lift in order to indicate improved lower extremity weightbearing tolerance for progression to standing in parallel bars. Baseline: 1 minute on most recent stand 02/21/22; 06/06/2022- Patient did attempt today after complaining of right LE pain last week. He attempted 3 stands using sit to stand lift. - all over 1 min- last one approx 42min 48 sec- stopped due to fatigue. More erect standing in lift today - still poor gluteal strength but able to activate glutes and extend hips upon command but unable to hold > 5 sec.  Goal status: ONGOING- Progressing Target date: 08/29/2022  8. Patient will tolerate sitting unsupported demonstrating ability to perform dynamic UE/LE activities for 30 minutes  independently to demonstrate ability to sit at edge of bed to eat or perform some ADL's/exercise for optimal quality of life.  Baseline: 06/06/2022- Patient able to sit and perform some UE/LE exercises but requires CGA at times for  safety- he is able to static sit for 30 min with supervision.   Goal Status: NEW  Target date: 08/29/2022       Plan     Clinical Impression Statement Patient arrived with excellent motivation and worked hard with standing - Focusing on breathing and even able to perform some standing therex today - although minimal increased quad contraction- able to contract glutes and stand more erect with practice.  Pt will continue to benefit from skilled physical therapy intervention to address impairments, improve QOL, and attain therapy goals.    Personal Factors and Comorbidities Comorbidity 3+;Time since onset of injury/illness/exacerbation    Comorbidities CVA, diabetes, Seizures    Examination-Activity Limitations Bathing;Bed Mobility;Bend;Caring for Others;Carry;Dressing;Hygiene/Grooming;Lift;Locomotion Level;Reach Overhead;Self Feeding;Sit;Squat;Stairs;Stand;Transfers;Toileting    Examination-Participation Restrictions Cleaning;Community Activity;Driving;Laundry;Medication Management;Meal Prep;Occupation;Personal Finances;Shop;Yard Work;Volunteer    Stability/Clinical Decision Making Evolving/Moderate complexity    Rehab Potential Fair    PT Frequency 2x / week    PT Duration 12 weeks    PT Treatment/Interventions ADLs/Self Care Home Management;Cryotherapy;Electrical Stimulation;Moist Heat;Ultrasound;DME Instruction;Gait training;Stair training;Functional mobility training;Therapeutic exercise;Balance training;Patient/family education;Orthotic Fit/Training;Neuromuscular re-education;Wheelchair mobility training;Manual techniques;Passive range of motion;Dry needling;Energy conservation;Taping;Visual/perceptual remediation/compensation;Joint Manipulations    PT Next Visit Plan core strength- sitting at EOM, postural control, sit to stand if able; Continue with progressive LE Strengthening.    PT Home Exercise Plan No changes to HEP today    Consulted and Agree with Plan of Care Patient;Family  member/caregiver    Family  Member [place] Joey Lierman, PT Physical Therapist- [facility]  06/30/2022, 3:39 PM

## 2022-06-30 NOTE — Therapy (Addendum)
Occupational Therapy Treatment Note  Patient Name: Paul Hall MRN: [id number] DOB:1995-06-04, 27 y.o., male Today's Date: 06/30/2022  PCP: Dr. Gerarda Fraction REFERRING PROVIDER: Dr. Gerarda Fraction   OT End of Session - 06/30/22 1813     Visit Number 27    Number of Visits 40    Date for OT Re-Evaluation 08/17/22    Authorization Type Progress report period starting 10/06/2021    OT Start Time 1650    OT Stop Time 1730    OT Time Calculation (min) 40 min    Activity Tolerance Patient tolerated treatment well    Behavior During Therapy WFL for tasks assessed/performed                     Past Medical History:  Diagnosis Date   Diabetes mellitus ([place])    Hypertension    Stroke [facility])    Past Surgical History:  Procedure Laterality Date   IVC FILTER PLACEMENT ([place] [facility])     LEG SURGERY     PEG TUBE PLACEMENT     TRACHEOSTOMY     Patient Active Problem List   Diagnosis Date Noted   Sepsis due to vancomycin resistant Enterococcus species ([place]) 06/06/2019   SIRS (systemic inflammatory response syndrome) ([place]) 06/05/2019   Acute lower UTI 06/05/2019   VRE (vancomycin-resistant Enterococci) infection 06/05/2019   Anemia 06/05/2019   Skin ulcer of sacrum with necrosis of muscle ([place])    Urinary retention    Type 2 diabetes mellitus without complication, with long-term current use of insulin ([facility])    Tachycardia    Lower extremity edema    Acute metabolic encephalopathy    Obstructive sleep apnea    Morbid obesity with BMI of 60.0-69.9, adult ([place])    Goals of care, counseling/discussion    Palliative care encounter    Sepsis ([place]) 04/27/2019   H/O insulin dependent diabetes mellitus 04/27/2019   History of CVA with residual deficit 04/27/2019   Seizure disorder ([place]) 04/27/2019   Decubitus ulcer of sacral region, stage 4 ([place]) 04/27/2019    ONSET DATE: 01/2019  REFERRING DIAG: CVA/COVID-19  THERAPY DIAG:  Muscle weakness (generalized)  Rationale for Evaluation  and Treatment Rehabilitation  SUBJECTIVE:   SUBJECTIVE STATEMENT:  Pt. Reports doing well today.  Pt accompanied by: self and family member  PERTINENT HISTORY:  Pt. is a 27 y.o. male who was diagnosed with COVID-19, and CVA with resultant quadriplegia. Pt. Was then hospitalized with VRE UTI. PMHx includes: urinary retention, seizure disorder, obstructive sleep disorder, DM Type II, Morbid obesity.   PRECAUTIONS: None  WEIGHT BEARING RESTRICTIONS No  PAIN:  Are you having pain? No-soreness in right forearm from recent Botox.  FALLS: Has patient fallen in last 6 months? No  LIVING ENVIRONMENT: Lives with: lives with their family Lives in: House/apartment Stairs: No Level Entry Has following equipment at home: Wheelchair (power) and hoyer lift, sit to stand lift  PLOF: Independent  PATIENT GOALS: To be able to engage in more daily care tasks.  OBJECTIVE:   HAND DOMINANCE: Right  ADLs:  Transfers/ambulation related to ADLs: Eating: Pt. reports being able to hold standard utensils, and is starting to engage more in self-feeding tasks, hand to mouth patterns. Pt. Reports that he does as much as he can with the task, and family assists  with the remainder of the task. Grooming: Pt. Is able to initiate holding an electric toothbrush, and brush his teeth. Family assists LB Dressing: Total Assist UE dressing: Pt.  is now able to reach up to actively assist with grasping , and pulling his gown down. Toileting:  Total Assist Bathing: MaxA UB, Total assist LB Tub Shower transfers: N/A Equipment: See above    IADLs: Shopping: Relies on family to assist Light housekeeping: Total Assist Meal Prep: Total Assist Community mobility:   Medication management:  Total Assist  Financial management: N/A Handwriting: Not legible: Pt. Is able to hold a pen with the left hand, and initiate marking the page. Pt.'s eye glasses were not available  MOBILITY STATUS:  Power w/c  POSTURE  COMMENTS:  Pt. Requires position changes in his power w/c  ACTIVITY TOLERANCE: Activity tolerance:  Fair  FUNCTIONAL OUTCOME MEASURES: FOTO: 40  TR score: 45  05/25/2022:   FOTO: 50 TR score: 45   UPPER EXTREMITY ROM     Active ROM Right eval Right 03/21/2022 Right 05/25/2022  Left eval Left  03/21/2022 Left 05/25/2022  Shoulder flexion 106 scaption 105  Scaption 62 flexion 110 scaption  119 123 125  Shoulder abduction 114 90 97  110 115 115  Shoulder adduction         Shoulder extension         Shoulder internal rotation         Shoulder external rotation         Elbow flexion 120(130) 145 145  135(135) 145 145  Elbow extension -45(-35) -22(-35) -28(-22)  -27(-18) -21(-20) -20(-16)  Wrist flexion         Wrist extension -30(10) -25(10) -10(30)  10(50) 19(50) 28(50)  Wrist ulnar deviation         Wrist radial deviation         Wrist pronation         Wrist supination   Limited by flexor tone    Limited by flexor tone  (Blank rows = not tested)      UPPER EXTREMITY MMT:     Right eval Right 03/21/2022 Right  05/25/2022  Left eval Left 03/21/2022 Left  05/25/2022  Shoulder flexion 3-/5 scaption 3-/5 3-/5  3-/5 3/5 3+/5  Shoulder abduction 3-/5 3-/5 3-/5  3-/5 3-/5 4-/5  Shoulder adduction         Shoulder extension         Shoulder internal rotation         Shoulder external rotation         Elbow flexion 3/5 3/5 3+/5  3/5 3+/5 4-/5  Elbow extension 2-/5 2/5 3-/5  2-/5 2/5   Wrist flexion         Wrist extension 2-/5 2-/5 2/5  2/5 2+/5 3-/5  Wrist ulnar deviation         Wrist radial deviation         Wrist pronation         Wrist supination         (Blank rows = not tested)     HAND FUNCTION:  Grip strength: Right: 0 lbs; Left: 0 lbs and Lateral pinch: Right: 5 lbs, Left: 2 lbs  03/21/2022: Lateral pinch: Right: 3.5 lbs, Left: 2 lbs  Bilateral digit PIP/DIP flexion contractures with MP hyperextension with attempts for AROM. Pt. is able to  tolerate AROM to the bilateral digits at the initial evaluation however, has a history of pain in the digits.  COORDINATION:  Eval: Pt. is unable to grasp 9-hole test pegs. Pt. is able to initiate grasping larger pegs, and is able to hold a pen in the  left hand.  SENSATION: Light touch: Impaired   EDEMA:  N/A  MUSCLE TONE: BUE flexor Spasticity  COGNITION Overall cognitive status: Continue to assess in functional context  VISION:   Subjective report: Pt. was not wearing glasses at the time of the initial eval.  Baseline vision: Vision is very limited. Wears glasses all the time Visual history: History of impaired vision following CVA. Pt. Has received [facility] low vision rehabilitation program.   VISION ASSESSMENT: Impaired To be further assessed in functional context  PERCEPTION: Impaired   PRAXIS: Impaired: motor planning  OBSERVATIONS:  Pt reports being on Tramadol   TODAY'S TREATMENT:   Neuromuscular re-education:  Pt. worked on bilateral hand coordination skills with emphasis, and focus on tying  large laces at the tabletop. Pt. Worked on tasks shifting large Pegs from the right hand to the left hand, and reaching out to place them vertically into a pegboard placed at an incline on the tabletop.      PATIENT EDUCATION: Education details: There. Margorie John Person educated: Patient and Parent Education method: Explanation, Demonstration, Tactile cues, and Verbal cues Education comprehension: verbalized understanding, returned demonstration, verbal cues required, and needs further education   HOME EXERCISE PROGRAM:   Continue ongoing assessment, and continue to provide as needed.     GOALS: Goals reviewed with patient? Yes  SHORT TERM GOALS: Target date:05/03/2022     To assess splint fit, and make appropriate adjustments to promote good skin integrity through the palmar surface of the bilateral hands.  Baseline: 05/25/22: Goal  currently met, however ongoing as needs to assess splint fit arise. 03/23/2022: Pt. is wearing splints a couple of hours at night bilateral resting hand splints. 03/21/2022: Pt. is wearing splints a couple of hours at night bilateral resting hand splints. Goal status: Continue ongoing assessment as requested, and as needed   LONG TERM GOALS: Target date: 06/13/2022     FOTO score will Improve by 2 points for Pt. perceived improvement with the assessment specific ADL/IADL tasks.  Baseline: 05/25/2022: FOTO score: 50,  TR score: 45 Eval: FOTO score: 40,  TR score: 45 Goal status: Achieved   2.   Pt. will independently perform oral care for 100% of the task after complete set-up. Baseline: 05/25/2022:  Pt. Is able to initiate and perform oral care for approximately 90% of the task. Complete set-up required. Assi needed only for the very back teeth. 03/23/2022: Pt. Is able to initiate and perform oral care for approximately 75% of the task. Pt. Requires assist at proximally at the elbow for through oral care. 03/21/2022: Pt. Is able to initiate and perform oral care for approximately 75% of the task. Pt. Requires assist at proximally at the elbow for through oral care. Eval: Pt. is able to initiate using an electric toothbrush. Pt. requires assist for set-up, and assist for thoroughness, and as he Pt. fatigues. Goal status: Ongoing  3.  Pt. Will be independent with self-feeding for 100% of the meal after complete set-up Baseline: 05/25/2022: Pt. Is able to use a spoon to scoop cereal when feeding himself cereal 85% of the time. Pt. Is able to feed himself snack/finger foods 100% of the time. Pt. Continues to work on consistency of  stabilizing a cup/mug when drinking. Pt. Is able to grasp a water bottle with assist initially, with assist tapering off as he drinks.03/23/2022: Pt. Is able to perform scooping cereal for 75% of the time. Pt. required assist, and support at the left  elbow, and Pt. Presents with  limited forearm supination when using the spoon, and bringing it towards his mouth. Pt. Is able to use a fork to spear items, and perform the hand to mouth pattern.  03/21/2022: Pt. Is able to perform scooping cereal for 75% of the time. Pt. required assist, and support at the left elbow, and Pt. presents with limited forearm supination when using the spoon, and bringing it towards his mouth. Pt. Is able to use a fork to spear items, and perform the hand to mouth pattern.  Eval: Pt. is able to hold standard standard utensils. Pt. Performs as much of the task as he, can and has assistance for the remainder. Goal status: Ongoing  4.  Pt. Will improve grasp patterns and consistently grasp 1/2" objects for ADL, and IADL tasks.  Baseline:05/25/2022: Pt. Is working on improving consistency of grasping 1/2" objects with visual cues.  03/23/2022: Pt. Is able to grasp 1" objects consistently,and continues to work on the hand patterns needed to grasp 1/2" objects.03/21/2022: Pt. Is able to grasp 1" objects consistently,and continues to work on the hand patterns needed to grasp 1/2" objects. Eval: Pt. is able to grasp 1" objects intermittently using a lateral grasp pattern. Goal status: Ongoing  5.  Pt. will independently write his name legibly with letter sizes under 2". Baseline: 05/25/2022: Pt. to continue to work towards formulating grasp patterns in preparation for grasping a large width pen. Pt. Requires visual cues. 03/23/2022: Pt. Is able to write his name with modA, however has difficulty with formulating letter sizes less than 2" in size with 50% legibility for the 3 letters of his name.03/21/2022: Pt. Is able to write his name with modA, however has difficulty with formulating letter sizes less than 2" in size with 50% legibility for the 3 letters of his name. Eval: Pt is able to hold a thin marker with his left hand, and formulate a line, and initiate a circular pattern (Pt. without glasses today) Goal  status: Ongoing  6. Pt. Will reach up to comb/brush his hair  with minA.  Baseline: 05/25/2022: Pt. Is able to reach up with the left hand to the left side, top, and back of his head. 03/23/2022: Pt. is now able to more consistently initiate reaching up to his head with his left hand in preparation for haircare12/18/2023: Pt. is now able to more consistently initiate reaching up to his head with his left hand in preparation for haircare. Eval: Pt. is able to initiate reaching up for hair care with a long handled brush, however is unable to sustain UE's in elevation to perform the task.     Goal status: Ongoing  7. Pt. Will independently navigate the w/c through his environment with minA with visual scanning, and hand placement on the controls.  Baseline: 05/25/2022: Pt. Requires minA  and  cues to navigate the w/c in wide spaces, and requires Mod cues to navigate the w/c through more narrow doorways, and tighter turns. Pt. Requires max cues for scanning through the environment, and moderate cues for hand placement on the controls.     Goal status:Ongoing       ASSESSMENT:  CLINICAL IMPRESSION:  Pt. Continues to require cues, and increased assist to maintain hand placement on the plastic bottle 2/2 more relaxed 2nd digit PIP flexion with MP hyperextension with thumb IP hyperextension. Pt. 's 4th,and 5th digit PIP tightness makes it difficult to secure a grasp on the water bottle. Pt. Is using a  mug with a handle and straw for water independently at home. Pt. Continues to require step-by-step visual cues, and hand over hand assist for form, and technique during all tasks. Pt. requires visual cues, and assist for placement of the laces in each of  his hands. Pt. was able to successfully tie laces with step by step MaxA, and max cues. Pt. Was able to shift the large pegs from the right hand to the left hand at midline with visual cues, and assist to for secure placement prior to placing them into the  pegboard. Pt. Is improving with consistency, and accuracy during functional reaching. Pt. requires visual cues, however the intensity of visual cues are dependent upon how the Pt. Is feeling on a particular day. Pt. continues to work on improving BUE functioning, w/c navigation, and visual compensatory strategies to improve, and maximize independence with ADLs, and IADLs.     PERFORMANCE DEFICITS in functional skills including ADLs, IADLs, coordination, dexterity, proprioception, ROM, strength, pain, [place], GMC, decreased knowledge of use of DME, and UE functional use, cognitive skills including safety awareness, and psychosocial skills including coping strategies, environmental adaptation, habits, and routines and behaviors.   IMPAIRMENTS are limiting patient from ADLs, IADLs, leisure, and social participation.   COMORBIDITIES may have co-morbidities  that affects occupational performance. Patient will benefit from skilled OT to address above impairments and improve overall function.  MODIFICATION OR ASSISTANCE TO COMPLETE EVALUATION: Maximum or significant modification of tasks or assist is necessary to complete an evaluation.  OT OCCUPATIONAL PROFILE AND HISTORY: Comprehensive assessment: Review of records and extensive additional review of physical, cognitive, psychosocial history related to current functional performance.  CLINICAL DECISION MAKING: High - multiple treatment options, significant modification of task necessary  REHAB POTENTIAL: Fair    EVALUATION COMPLEXITY: High    PLAN: OT FREQUENCY: 2 x's a week  OT DURATION:12 weeks  PLANNED INTERVENTIONS: self care/ADL training, therapeutic exercise, therapeutic activity, neuromuscular re-education, manual therapy, passive range of motion, patient/family education, and cognitive remediation/compensation  RECOMMENDED OTHER SERVICES: PT  CONSULTED AND AGREED WITH PLAN OF CARE: Patient and family member/caregiver  PLAN FOR NEXT  SESSION: Initiate treatment   Harrel Carina, MS, OTR/L   Harrel Carina, OT 06/30/2022, 6:15 PM

## 2022-07-04 ENCOUNTER — Ambulatory Visit: Payer: BC Managed Care – PPO | Admitting: Occupational Therapy

## 2022-07-04 ENCOUNTER — Ambulatory Visit: Payer: BC Managed Care – PPO

## 2022-07-06 ENCOUNTER — Ambulatory Visit: Payer: BC Managed Care – PPO | Attending: Physician Assistant | Admitting: Occupational Therapy

## 2022-07-06 ENCOUNTER — Ambulatory Visit: Payer: BC Managed Care – PPO

## 2022-07-06 DIAGNOSIS — H543 Unqualified visual loss, both eyes: Secondary | ICD-10-CM | POA: Diagnosis not present

## 2022-07-06 DIAGNOSIS — M6281 Muscle weakness (generalized): Secondary | ICD-10-CM

## 2022-07-06 DIAGNOSIS — R2681 Unsteadiness on feet: Secondary | ICD-10-CM | POA: Insufficient documentation

## 2022-07-06 DIAGNOSIS — R262 Difficulty in walking, not elsewhere classified: Secondary | ICD-10-CM

## 2022-07-06 DIAGNOSIS — R269 Unspecified abnormalities of gait and mobility: Secondary | ICD-10-CM | POA: Diagnosis not present

## 2022-07-06 DIAGNOSIS — R278 Other lack of coordination: Secondary | ICD-10-CM

## 2022-07-06 DIAGNOSIS — I6930 Unspecified sequelae of cerebral infarction: Secondary | ICD-10-CM | POA: Diagnosis not present

## 2022-07-07 DIAGNOSIS — G8250 Quadriplegia, unspecified: Secondary | ICD-10-CM | POA: Diagnosis not present

## 2022-07-07 DIAGNOSIS — I69365 Other paralytic syndrome following cerebral infarction, bilateral: Secondary | ICD-10-CM | POA: Diagnosis not present

## 2022-07-07 DIAGNOSIS — G40909 Epilepsy, unspecified, not intractable, without status epilepticus: Secondary | ICD-10-CM | POA: Diagnosis not present

## 2022-07-07 NOTE — Therapy (Signed)
Occupational Therapy Treatment Note  Patient Name: Paul Hall MRN: [id number] DOB:Sep 02, 1995, 27 y.o., male Today's Date: 07/07/2022  PCP: Dr. Gerarda Fraction REFERRING PROVIDER: Dr. Gerarda Fraction   OT End of Session - 07/07/22 0841     Visit Number 28    Number of Visits 36    Date for OT Re-Evaluation 08/17/22    OT Start Time [id number]    OT Stop Time 1730    OT Time Calculation (min) 40 min    Activity Tolerance Patient tolerated treatment well    Behavior During Therapy WFL for tasks assessed/performed                     Past Medical History:  Diagnosis Date   Diabetes mellitus    Hypertension    Stroke    Past Surgical History:  Procedure Laterality Date   IVC FILTER PLACEMENT ([place] [facility])     LEG SURGERY     PEG TUBE PLACEMENT     TRACHEOSTOMY     Patient Active Problem List   Diagnosis Date Noted   Sepsis due to vancomycin resistant Enterococcus species 06/06/2019   SIRS (systemic inflammatory response syndrome) 06/05/2019   Acute lower UTI 06/05/2019   VRE (vancomycin-resistant Enterococci) infection 06/05/2019   Anemia 06/05/2019   Skin ulcer of sacrum with necrosis of muscle    Urinary retention    Type 2 diabetes mellitus without complication, with long-term current use of insulin    Tachycardia    Lower extremity edema    Acute metabolic encephalopathy    Obstructive sleep apnea    Morbid obesity with BMI of 60.0-69.9, adult    Goals of care, counseling/discussion    Palliative care encounter    Sepsis 04/27/2019   H/O insulin dependent diabetes mellitus 04/27/2019   History of CVA with residual deficit 04/27/2019   Seizure disorder 04/27/2019   Decubitus ulcer of sacral region, stage 4 04/27/2019    ONSET DATE: 01/2019  REFERRING DIAG: CVA/COVID-19  THERAPY DIAG:  Muscle weakness (generalized)  Rationale for Evaluation and Treatment Rehabilitation  SUBJECTIVE:   SUBJECTIVE STATEMENT:  Pt. Reports doing well today.  Pt accompanied by:  self and family member  PERTINENT HISTORY:  Pt. is a 27 y.o. male who was diagnosed with COVID-19, and CVA with resultant quadriplegia. Pt. Was then hospitalized with VRE UTI. PMHx includes: urinary retention, seizure disorder, obstructive sleep disorder, DM Type II, Morbid obesity.   PRECAUTIONS: None  WEIGHT BEARING RESTRICTIONS No  PAIN:  Are you having pain? No-soreness in right forearm from recent Botox.  FALLS: Has patient fallen in last 6 months? No  LIVING ENVIRONMENT: Lives with: lives with their family Lives in: House/apartment Stairs: No Level Entry Has following equipment at home: Wheelchair (power) and hoyer lift, sit to stand lift  PLOF: Independent  PATIENT GOALS: To be able to engage in more daily care tasks.  OBJECTIVE:   HAND DOMINANCE: Right  ADLs:  Transfers/ambulation related to ADLs: Eating: Pt. reports being able to hold standard utensils, and is starting to engage more in self-feeding tasks, hand to mouth patterns. Pt. Reports that he does as much as he can with the task, and family assists  with the remainder of the task. Grooming: Pt. Is able to initiate holding an electric toothbrush, and brush his teeth. Family assists LB Dressing: Total Assist UE dressing: Pt. is now able to reach up to actively assist with grasping , and pulling his gown down. Toileting:  Total  Assist Bathing: MaxA UB, Total assist LB Tub Shower transfers: N/A Equipment: See above    IADLs: Shopping: Relies on family to assist Light housekeeping: Total Assist Meal Prep: Total Assist Community mobility:   Medication management:  Total Assist  Financial management: N/A Handwriting: Not legible: Pt. Is able to hold a pen with the left hand, and initiate marking the page. Pt.'s eye glasses were not available  MOBILITY STATUS:  Power w/c  POSTURE COMMENTS:  Pt. Requires position changes in his power w/c  ACTIVITY TOLERANCE: Activity tolerance:  Fair  FUNCTIONAL  OUTCOME MEASURES: FOTO: 40  TR score: 45  05/25/2022:   FOTO: 50 TR score: 45   UPPER EXTREMITY ROM     Active ROM Right eval Right 03/21/2022 Right 05/25/2022  Left eval Left  03/21/2022 Left 05/25/2022  Shoulder flexion 106 scaption 105  Scaption 62 flexion 110 scaption  119 123 125  Shoulder abduction 114 90 97  110 115 115  Shoulder adduction         Shoulder extension         Shoulder internal rotation         Shoulder external rotation         Elbow flexion 120(130) 145 145  135(135) 145 145  Elbow extension -45(-35) -22(-35) -28(-22)  -27(-18) -21(-20) -20(-16)  Wrist flexion         Wrist extension -30(10) -25(10) -10(30)  10(50) 19(50) 28(50)  Wrist ulnar deviation         Wrist radial deviation         Wrist pronation         Wrist supination   Limited by flexor tone    Limited by flexor tone  (Blank rows = not tested)      UPPER EXTREMITY MMT:     Right eval Right 03/21/2022 Right  05/25/2022  Left eval Left 03/21/2022 Left  05/25/2022  Shoulder flexion 3-/5 scaption 3-/5 3-/5  3-/5 3/5 3+/5  Shoulder abduction 3-/5 3-/5 3-/5  3-/5 3-/5 4-/5  Shoulder adduction         Shoulder extension         Shoulder internal rotation         Shoulder external rotation         Elbow flexion 3/5 3/5 3+/5  3/5 3+/5 4-/5  Elbow extension 2-/5 2/5 3-/5  2-/5 2/5   Wrist flexion         Wrist extension 2-/5 2-/5 2/5  2/5 2+/5 3-/5  Wrist ulnar deviation         Wrist radial deviation         Wrist pronation         Wrist supination         (Blank rows = not tested)     HAND FUNCTION:  Grip strength: Right: 0 lbs; Left: 0 lbs and Lateral pinch: Right: 5 lbs, Left: 2 lbs  03/21/2022: Lateral pinch: Right: 3.5 lbs, Left: 2 lbs  Bilateral digit PIP/DIP flexion contractures with MP hyperextension with attempts for AROM. Pt. is able to tolerate AROM to the bilateral digits at the initial evaluation however, has a history of pain in the  digits.  COORDINATION:  Eval: Pt. is unable to grasp 9-hole test pegs. Pt. is able to initiate grasping larger pegs, and is able to hold a pen in the left hand.  SENSATION: Light touch: Impaired   EDEMA:  N/A  MUSCLE TONE: BUE flexor Spasticity  COGNITION  Overall cognitive status: Continue to assess in functional context  VISION:   Subjective report: Pt. was not wearing glasses at the time of the initial eval.  Baseline vision: Vision is very limited. Wears glasses all the time Visual history: History of impaired vision following CVA. Pt. Has received treatment through the [facility] low vision rehabilitation program.   VISION ASSESSMENT: Impaired To be further assessed in functional context  PERCEPTION: Impaired   PRAXIS: Impaired: motor planning  OBSERVATIONS:  Pt reports being on Tramadol   TODAY'S TREATMENT:   Neuromuscular re-education:  Pt. focused on improving bilateral [facility] with manipulating nuts and bolts in midline at the tabletop. Pt. Was able to unscrew, and screw  the 1" nuts  on the bolts with cues for bilateral hand position initially. Pt. required increased time and cues for hand control during the task.   Self care:  Pt. worked on Materials engineer names of therapists using his right hand requiring hand-over-hand assist, and visual cues. The paper was positioned on an incline at the tabletop.  PATIENT EDUCATION: Education details: There. Margorie John Person educated: Patient and Parent Education method: Explanation, Demonstration, Tactile cues, and Verbal cues Education comprehension: verbalized understanding, returned demonstration, verbal cues required, and needs further education   HOME EXERCISE PROGRAM:   Continue ongoing assessment, and continue to provide as needed.     GOALS: Goals reviewed with patient? Yes  SHORT TERM GOALS: Target date:05/03/2022     To assess splint fit, and make appropriate adjustments to promote good skin  integrity through the palmar surface of the bilateral hands.  Baseline: 05/25/22: Goal currently met, however ongoing as needs to assess splint fit arise. 03/23/2022: Pt. is wearing splints a couple of hours at night bilateral resting hand splints. 03/21/2022: Pt. is wearing splints a couple of hours at night bilateral resting hand splints. Goal status: Continue ongoing assessment as requested, and as needed   LONG TERM GOALS: Target date: 06/13/2022     FOTO score will Improve by 2 points for Pt. perceived improvement with the assessment specific ADL/IADL tasks.  Baseline: 05/25/2022: FOTO score: 50,  TR score: 45 Eval: FOTO score: 40,  TR score: 45 Goal status: Achieved   2.   Pt. will independently perform oral care for 100% of the task after complete set-up. Baseline: 05/25/2022:  Pt. Is able to initiate and perform oral care for approximately 90% of the task. Complete set-up required. Assi needed only for the very back teeth. 03/23/2022: Pt. Is able to initiate and perform oral care for approximately 75% of the task. Pt. Requires assist at proximally at the elbow for through oral care. 03/21/2022: Pt. Is able to initiate and perform oral care for approximately 75% of the task. Pt. Requires assist at proximally at the elbow for through oral care. Eval: Pt. is able to initiate using an electric toothbrush. Pt. requires assist for set-up, and assist for thoroughness, and as he Pt. fatigues. Goal status: Ongoing  3.  Pt. Will be independent with self-feeding for 100% of the meal after complete set-up Baseline: 05/25/2022: Pt. Is able to use a spoon to scoop cereal when feeding himself cereal 85% of the time. Pt. Is able to feed himself snack/finger foods 100% of the time. Pt. Continues to work on consistency of  stabilizing a cup/mug when drinking. Pt. Is able to grasp a water bottle with assist initially, with assist tapering off as he drinks.03/23/2022: Pt. Is able to perform scooping cereal for 75%  of  the time. Pt. required assist, and support at the left elbow, and Pt. Presents with limited forearm supination when using the spoon, and bringing it towards his mouth. Pt. Is able to use a fork to spear items, and perform the hand to mouth pattern.  03/21/2022: Pt. Is able to perform scooping cereal for 75% of the time. Pt. required assist, and support at the left elbow, and Pt. presents with limited forearm supination when using the spoon, and bringing it towards his mouth. Pt. Is able to use a fork to spear items, and perform the hand to mouth pattern.  Eval: Pt. is able to hold standard standard utensils. Pt. Performs as much of the task as he, can and has assistance for the remainder. Goal status: Ongoing  4.  Pt. Will improve grasp patterns and consistently grasp 1/2" objects for ADL, and IADL tasks.  Baseline:05/25/2022: Pt. Is working on improving consistency of grasping 1/2" objects with visual cues.  03/23/2022: Pt. Is able to grasp 1" objects consistently,and continues to work on the hand patterns needed to grasp 1/2" objects.03/21/2022: Pt. Is able to grasp 1" objects consistently,and continues to work on the hand patterns needed to grasp 1/2" objects. Eval: Pt. is able to grasp 1" objects intermittently using a lateral grasp pattern. Goal status: Ongoing  5.  Pt. will independently write his name legibly with letter sizes under 2". Baseline: 05/25/2022: Pt. to continue to work towards formulating grasp patterns in preparation for grasping a large width pen. Pt. Requires visual cues. 03/23/2022: Pt. Is able to write his name with modA, however has difficulty with formulating letter sizes less than 2" in size with 50% legibility for the 3 letters of his name.03/21/2022: Pt. Is able to write his name with modA, however has difficulty with formulating letter sizes less than 2" in size with 50% legibility for the 3 letters of his name. Eval: Pt is able to hold a thin marker with his left hand, and  formulate a line, and initiate a circular pattern (Pt. without glasses today) Goal status: Ongoing  6. Pt. Will reach up to comb/brush his hair  with minA.  Baseline: 05/25/2022: Pt. Is able to reach up with the left hand to the left side, top, and back of his head. 03/23/2022: Pt. is now able to more consistently initiate reaching up to his head with his left hand in preparation for haircare12/18/2023: Pt. is now able to more consistently initiate reaching up to his head with his left hand in preparation for haircare. Eval: Pt. is able to initiate reaching up for hair care with a long handled brush, however is unable to sustain UE's in elevation to perform the task.     Goal status: Ongoing  7. Pt. Will independently navigate the w/c through his environment with minA with visual scanning, and hand placement on the controls.  Baseline: 05/25/2022: Pt. Requires minA  and  cues to navigate the w/c in wide spaces, and requires Mod cues to navigate the w/c through more narrow doorways, and tighter turns. Pt. Requires max cues for scanning through the environment, and moderate cues for hand placement on the controls.     Goal status:Ongoing       ASSESSMENT:  CLINICAL IMPRESSION:  Pt. required less assist for left hand placement on the plastic bottle while drinking from it, as well as less assist to hold the bottle in place when tilting his head back to drink from the bottle. Pt. continues to use a mug  with a handle and straw for water independently at home. Pt. continues to require step-by-step visual cues, and hand over hand assist for form, and technique during all tasks. Pt. was able to initiate hand movements when formulating each of the letters when writing the list of names. Pt. required hand-over-hand assist during writing, and to stabilize a standard pen. Pt. Required assist for bilateral hand position with performing movement patterns for screwing, and unscrewing nuts/bolts, and to change  direction. Pt. is improving with consistency, and accuracy during functional reaching. Pt. requires visual cues, however the intensity of visual cues are dependent upon how the Pt. is feeling on a particular day. Pt. continues to work on improving BUE functioning, w/c navigation, and visual compensatory strategies to improve, and maximize independence with ADLs, and IADLs.     PERFORMANCE DEFICITS in functional skills including ADLs, IADLs, coordination, dexterity, proprioception, ROM, strength, pain, Mier, GMC, decreased knowledge of use of DME, and UE functional use, cognitive skills including safety awareness, and psychosocial skills including coping strategies, environmental adaptation, habits, and routines and behaviors.   IMPAIRMENTS are limiting patient from ADLs, IADLs, leisure, and social participation.   COMORBIDITIES may have co-morbidities  that affects occupational performance. Patient will benefit from skilled OT to address above impairments and improve overall function.  MODIFICATION OR ASSISTANCE TO COMPLETE EVALUATION: Maximum or significant modification of tasks or assist is necessary to complete an evaluation.  OT OCCUPATIONAL PROFILE AND HISTORY: Comprehensive assessment: Review of records and extensive additional review of physical, cognitive, psychosocial history related to current functional performance.  CLINICAL DECISION MAKING: High - multiple treatment options, significant modification of task necessary  REHAB POTENTIAL: Fair    EVALUATION COMPLEXITY: High    PLAN: OT FREQUENCY: 2 x's a week  OT DURATION:12 weeks  PLANNED INTERVENTIONS: self care/ADL training, therapeutic exercise, therapeutic activity, neuromuscular re-education, manual therapy, passive range of motion, patient/family education, and cognitive remediation/compensation  RECOMMENDED OTHER SERVICES: PT  CONSULTED AND AGREED WITH PLAN OF CARE: Patient and family member/caregiver  PLAN FOR NEXT  SESSION: Initiate treatment   Harrel Carina, MS, OTR/L   Harrel Carina, MS, OTR/L 07/07/2022, 10:34 AM

## 2022-07-07 NOTE — Therapy (Addendum)
OUTPATIENT PHYSICAL THERAPY TREATMENT NOTE  Patient Name: Paul BolderSamuel Hakeem Plamondon MRN: [id number] DOB:09/04/1995, 27 y.o., male Today's Date: 07/13/2022  PCP:  Cindi CarbonEFERRING PROVIDER:  Lenise HeraldBenjamin Mann, PA-C   PT End of Session - 07/13/22 0956     Visit Number 96    Number of Visits 114    Date for PT Re-Evaluation 08/29/22    Authorization Type [employer] and [employer]- Need auth past 12th visit    Authorization Time Period 01/04/21-03/29/21; Recert 03/24/2021-06/16/2021; Recert 09/15/2021- 12/08/2021; Recert 12/13/2021-03/07/2022    Progress Note Due on Visit 100    PT Start Time 1603    PT Stop Time 1646    PT Time Calculation (min) 43 min    Equipment Utilized During Treatment Gait belt   Hoyer lift   Activity Tolerance Patient tolerated treatment well   queasiness/nausea   Behavior During Therapy WFL for tasks assessed/performed                          Past Medical History:  Diagnosis Date   Diabetes mellitus    Hypertension    Stroke    Past Surgical History:  Procedure Laterality Date   IVC FILTER PLACEMENT ([facility] HX)     LEG SURGERY     PEG TUBE PLACEMENT     TRACHEOSTOMY     Patient Active Problem List   Diagnosis Date Noted   Sepsis due to vancomycin resistant Enterococcus species 06/06/2019   SIRS (systemic inflammatory response syndrome) 06/05/2019   Acute lower UTI 06/05/2019   VRE (vancomycin-resistant Enterococci) infection 06/05/2019   Anemia 06/05/2019   Skin ulcer of sacrum with necrosis of muscle    Urinary retention    Type 2 diabetes mellitus without complication, with long-term current use of insulin    Tachycardia    Lower extremity edema    Acute metabolic encephalopathy    Obstructive sleep apnea    Morbid obesity with BMI of 60.0-69.9, adult    Goals of care, counseling/discussion    Palliative care encounter    Sepsis 04/27/2019   H/O insulin dependent diabetes mellitus 04/27/2019   History of CVA with residual deficit  04/27/2019   Seizure disorder 04/27/2019   Decubitus ulcer of sacral region, stage 4 04/27/2019    REFERRING DIAG: Cerebral infarction, unspecified   THERAPY DIAG:  Muscle weakness (generalized)  Other lack of coordination  Difficulty in walking, not elsewhere classified  History of CVA with residual deficit  Unsteadiness on feet  Rationale for Evaluation and Treatment Rehabilitation  PERTINENT HISTORY: Richrd HumblesSamuel Riddles is a 25yoM who presents with severe weakness, quadriparesis, altered sensorium, and visual impairment s/p critical illness and prolonged hospitalization. Pt hospitalized in October 2020 with ARDS 2/2 COVID19 infection. Pt sustained a complex and lengthy hospitalization which included tracheostomy, prolonged sedation, ECMO. In this period pt sustained CVA and SDH. Pt has now been liberated from tracheostomy and G-tube. Pt has since been hospitalized for wound infection and UTI. Pt lives with parents at home, has hospital bed and left chair, hoyer lift transfers, and power WC for mobility needs. Pt needs heavy physical assistance with ADL 2/2 BUE contractures and motor dysfunction   PRECAUTIONS: Fall  SUBJECTIVE: Patient reports feeling well overall today and denies any stomach issues.    Patient reports PAIN:   Are you having pain? No   TODAY'S TREATMENT:  -  *Dependent transfer using hoyer to transfer patient from power w/c to mat table- then patient assisted  to sitting at [employer] (hoyer pad underneath)    Therapeutic activities:   Sit to stand lift - performed multiple trials of standing today 1) static stand with 1 person on each side providing UE Support at forearms- x 3 min 13 sec  2) Static  standing - approx 1 min 38 sec  3) Static standing- 1 min 47 sec (patient stopped due to fatigue)            PATIENT EDUCATION: Education details: standing posture and exercise technique Person educated: Patient Education method: Explanation, Demonstration,  Tactile cues, and Verbal cues Education comprehension: verbalized understanding, returned demonstration, verbal cues required, tactile cues required, and needs further education   HOME EXERCISE PROGRAM:  Access Code: [email or web address] URL: [email or web address] Date: 03/23/2022 Prepared by: Maureen Ralphs  Exercises - Supine Bridge  - 3 x weekly - 3 sets - 10 reps - 2 hold - Supine Gluteal Sets  - 3 x weekly - 3 sets - 10 reps - 5 sec hold - Supine Quad Set  - 1 x daily - 3 x weekly - 3 sets - 10 reps - 5 hold - Seated Long Arc Quad  - 1 x daily - 7 x weekly - 3 sets - 10 reps - Seated Hip Adduction Squeeze with Ball  - 1 x daily - 3 x weekly - 3 sets - 10 reps - 5 hold - Seated Hip Abduction  - 1 x daily - 3 x weekly - 3 sets - 10 reps - 2 hold    PT Short Term Goals -       PT SHORT TERM GOAL #1   Title Pt will be independent with HEP in order to improve strength and balance in order to decrease fall risk and improve function at home and work.    Baseline 01/04/2021= No formal HEP in place; 12/12 no HEP in place; 05/10/2021-Patient and his father were able to report compliance with curent HEP consisting of mostly seated/reclined LE strengthening. Both verbalize no questions at this time.    Time 6    Period Weeks    Status Achieved    Target Date 02/15/21              PT Long Term Goals - Date for completion: 06/01/2022         PT LONG TERM GOAL #1   Title Patient will increase BLE gross strength by 1/2 muscle grade to improve functional strength for improved independence with potential gait, increased standing tolerance and increased ADL ability.    Baseline 01/04/2021- Patient presents with 1/5 to 3-/5 B LE strength with MMT; 12/12: goal partially met for Left knee/hip; 05/10/2021= 2-/5 bilateal Hip flex; 3+/5 bilateral Knee ext; 06/21/2021= Patient presents with 2-/5 bilateral Hip flex; 3+/5 bilateral knee ext/flex; 2-/5 left ankle DF; 0/5 right ankle- and able to  increase reps and resistance with LE's. 09/15/2021- Patient technically presents with 2-/5 B hip flex/abd/add - but he is able to raise his hip up to approx 100 deg which has improved. 3+/5 Bilateral knee ext, 2-/5 left ankle and 0/5 right ankle.  12/08/2021= Patient able to lift left knee at 110 deg of hip flex; presents with 3+/5 knee ext, 2-/5 left ankle DF and 0/5 right ankle DF, 2-/5 bilateral Hip abd in seated position.   12/6: R: knee 3+/5 ext, 2/5 flexion, left knee 3+/5 extension, 3+/5 flexion, R hip: 2+/5 hip add, 2+/5 hip ABD L hip: 4-/5 hip ABD, 3+/5 hip ADD, 3+/5  hip flexion; 06/06/2022= Patient now presents with 2-/5 right ankle DF/PF   Time 12    Period Weeks    Status MET   Target Date 03/07/2022     PT LONG TERM GOAL #2   Title Patient will tolerate sitting unsupported demonstrating erect sitting posture for 15 minutes with CGA to demonstrate improved back extensor strength and improved sitting tolerance.    Baseline 01/04/2021- Patient confied to sitting in lift chair or electric power chair with back support and unable to sit upright without physical assistance; 12/12: tolerates <1 minutes upright unsupported sitting. 05/10/2021=static sit with forward trunk lean  in his power wheelchair without back support x approx 3 min. 06/21/2021=Unable to assess today due to patient with acute back pain but on previous visit able to sit x 8 min without back support. 09/15/2021- on last visit- 09/13/2021- patient was able to sit unsupported x 8 min at edge of mat. 10/13/2023 - Patient was able to sit at edge of mat with varying level of assist today from SBA to min A for a total of 20 min. 12/13/2021= Patient demonstrated unsupported sitting at edge of mat for approx 20 min   Time 12    Period Weeks    Status GOAL MET   Target Date 12/08/21      PT LONG TERM GOAL #3   Title Patient will demonstrate ability to perform static standing in // bars > 2 min with Max Assist  without loss of balance and fair  posture for improved overall strength for pre-gait and transfer activities.    Baseline 01/04/2021= Patient current uanble to stand- Dependent on hoyer or sit to stand lift for transfers. 05/10/2021=Not appropriate yet- Currently still dependent with all transfers using hoyer. 06/21/2021= Patient continuing now to focus on LE strengthening to prepare for standing-unable to try today due to acute low back pain-  planning on attempting in new cert period. 09/15/2021- Patient has attempted standing 2x in past two week- max Assist of 2 people - only once was he successful to clearing his bottom from chair - Will continue to be a focus during the new certification. 12/13/2021= Patient has been limited secondary to increased overall low back pain during this certification and will require more time to focus on this goal.  12/6: not assessed this date, will assess at date when 2-3 PTs are present for assistance    Time 12    Period Weeks    Status GOAL not appropriate at this time - may attempt in future once Patient presents with improved overall LE strength.          PT LONG TERM GOAL #4   Title Pt will improve FOTO score by 10 points or more demonstrating improved perceived functional ability    Baseline FOTO 7 on 10/17; 03/15/21: FOTO 12; 05/10/2021 06/21/2021= 1; 09/15/2021= 9; 12/13/2021= Will issue next visit 12/6: 4; 06/06/2022= Will assess next visit   Time 12    Period Weeks    Status ONGOING   Target date 08/29/2022     PT LONG TERM GOAL #5   Title Patient will perform sit to stand transfer with appropriate AD and max assist of 2 people with 75% consistency to prepare for pregait activities.    Baseline 09/15/2021= Patient unable to stand well- unable to clear his bottom off chair with Max assist of 2 persons. 12/13/2021- Goal not appropriate to try yet but will keep and roll over to next cert as shift continues  to focus on transfers/standing   Time 12    Period Weeks    Status Goal still not appropriate  for now but will keep active for future   Target date    PT LONG TERM GOAL #6  Title Patient will tolerate sitting unsupported demonstrating erect sitting posture for 30 minutes with CGA to demonstrate improved back extensor strength and improved sitting tolerance.   Baseline 12/13/2021= Patient demonstrated unsupported sitting at edge of mat for approx 20 min;  12/29/2021- Patient performed approx 30 min of dynamic sitting activities today. 06/06/2022= Patient demonstrated ability to sit and perform static and dynamic UE/LE movement with only Supervision.   Time 12   Period Weeks   Status GOAL MET         7.  Patient will tolerate 5 minutes or more of standing in sit to stand lift in order to indicate improved lower extremity weightbearing tolerance for progression to standing in parallel bars. Baseline: 1 minute on most recent stand 02/21/22; 06/06/2022- Patient did attempt today after complaining of right LE pain last week. He attempted 3 stands using sit to stand lift. - all over 1 min- last one approx 48 sec- stopped due to fatigue. More erect standing in lift today - still poor gluteal strength but able to activate glutes and extend hips upon command but unable to hold > 5 sec.  Goal status: ONGOING- Progressing Target date: 08/29/2022  8. Patient will tolerate sitting unsupported demonstrating ability to perform dynamic UE/LE activities for 30 minutes  independently to demonstrate ability to sit at edge of bed to eat or perform some ADL's/exercise for optimal quality of life.  Baseline: 06/06/2022- Patient able to sit and perform some UE/LE exercises but requires CGA at times for safety- he is able to static sit for 30 min with supervision.   Goal Status: NEW  Target date: 08/29/2022       Plan     Clinical Impression Statement Patient performed well initially with his longest duration with standing to date. He was able to activate his glutes yet unable to maintain contraction longer  than around 5 sec. After 1st stand- patient was exhausted and anxious but able to stand 2 more time with max physical effort- able to stand more erect on 1st and 3rd trials.   Pt will continue to benefit from skilled physical therapy intervention to address impairments, improve QOL, and attain therapy goals.    Personal Factors and Comorbidities Comorbidity 3+;Time since onset of injury/illness/exacerbation    Comorbidities CVA, diabetes, Seizures    Examination-Activity Limitations Bathing;Bed Mobility;Bend;Caring for Others;Carry;Dressing;Hygiene/Grooming;Lift;Locomotion Level;Reach Overhead;Self Feeding;Sit;Squat;Stairs;Stand;Transfers;Toileting    Examination-Participation Restrictions Cleaning;Community Activity;Driving;Laundry;Medication Management;Meal Prep;Occupation;Personal Finances;Shop;Yard Work;Volunteer    Stability/Clinical Decision Making Evolving/Moderate complexity    Rehab Potential Fair    PT Frequency 2x / week    PT Duration 12 weeks    PT Treatment/Interventions ADLs/Self Care Home Management;Cryotherapy;Electrical Stimulation;Moist Heat;Ultrasound;DME Instruction;Gait training;Stair training;Functional mobility training;Therapeutic exercise;Balance training;Patient/family education;Orthotic Fit/Training;Neuromuscular re-education;Wheelchair mobility training;Manual techniques;Passive range of motion;Dry needling;Energy conservation;Taping;Visual/perceptual remediation/compensation;Joint Manipulations    PT Next Visit Plan core strength- sitting at EOM, postural control, sit to stand if able; Continue with progressive LE Strengthening.    PT Home Exercise Plan No changes to HEP today    Consulted and Agree with Plan of Care Patient;Family member/caregiver    Family Member Consulted Mom             Louis Meckel, PT Physical Therapist- [place]  07/13/2022, 9:57 AM

## 2022-07-10 DIAGNOSIS — G4733 Obstructive sleep apnea (adult) (pediatric): Secondary | ICD-10-CM | POA: Diagnosis not present

## 2022-07-11 ENCOUNTER — Ambulatory Visit: Payer: BC Managed Care – PPO | Admitting: Occupational Therapy

## 2022-07-11 ENCOUNTER — Ambulatory Visit: Payer: BC Managed Care – PPO

## 2022-07-13 ENCOUNTER — Ambulatory Visit: Payer: BC Managed Care – PPO

## 2022-07-13 ENCOUNTER — Ambulatory Visit: Payer: BC Managed Care – PPO | Admitting: Occupational Therapy

## 2022-07-18 ENCOUNTER — Encounter: Payer: Self-pay | Admitting: Occupational Therapy

## 2022-07-18 ENCOUNTER — Ambulatory Visit: Payer: BC Managed Care – PPO | Admitting: Occupational Therapy

## 2022-07-18 ENCOUNTER — Ambulatory Visit: Payer: BC Managed Care – PPO

## 2022-07-18 DIAGNOSIS — I6930 Unspecified sequelae of cerebral infarction: Secondary | ICD-10-CM

## 2022-07-18 DIAGNOSIS — R262 Difficulty in walking, not elsewhere classified: Secondary | ICD-10-CM

## 2022-07-18 DIAGNOSIS — M6281 Muscle weakness (generalized): Secondary | ICD-10-CM

## 2022-07-18 DIAGNOSIS — R278 Other lack of coordination: Secondary | ICD-10-CM

## 2022-07-18 DIAGNOSIS — R269 Unspecified abnormalities of gait and mobility: Secondary | ICD-10-CM | POA: Diagnosis not present

## 2022-07-18 DIAGNOSIS — R2681 Unsteadiness on feet: Secondary | ICD-10-CM | POA: Diagnosis not present

## 2022-07-18 DIAGNOSIS — H543 Unqualified visual loss, both eyes: Secondary | ICD-10-CM | POA: Diagnosis not present

## 2022-07-18 NOTE — Therapy (Signed)
OUTPATIENT PHYSICAL THERAPY TREATMENT NOTE  Patient Name: Paul Hall MRN: [id number] DOB:1996/02/28, 27 y.o., male Today's Date: 07/20/2022  PCP:  Cindi Carbon PROVIDER:  Lenise Herald, PA-C   PT End of Session - 07/19/22 0753     Visit Number 97    Number of Visits 114    Date for PT Re-Evaluation 08/29/22    Authorization Type [employer] and [employer]- Need auth past 12th visit    Authorization Time Period 01/04/21-03/29/21; Recert 03/24/2021-06/16/2021; Recert 09/15/2021- 12/08/2021; Recert 12/13/2021-03/07/2022    Progress Note Due on Visit 100    PT Start Time 1601    PT Stop Time 1645    PT Time Calculation (min) 44 min    Equipment Utilized During Treatment Gait belt   Hoyer lift   Activity Tolerance Patient tolerated treatment well   queasiness/nausea   Behavior During Therapy WFL for tasks assessed/performed                          Past Medical History:  Diagnosis Date   Diabetes mellitus    Hypertension    Stroke    Past Surgical History:  Procedure Laterality Date   IVC FILTER PLACEMENT ([facility] HX)     LEG SURGERY     PEG TUBE PLACEMENT     TRACHEOSTOMY     Patient Active Problem List   Diagnosis Date Noted   Sepsis due to vancomycin resistant Enterococcus species 06/06/2019   SIRS (systemic inflammatory response syndrome) 06/05/2019   Acute lower UTI 06/05/2019   VRE (vancomycin-resistant Enterococci) infection 06/05/2019   Anemia 06/05/2019   Skin ulcer of sacrum with necrosis of muscle    Urinary retention    Type 2 diabetes mellitus without complication, with long-term current use of insulin    Tachycardia    Lower extremity edema    Acute metabolic encephalopathy    Obstructive sleep apnea    Morbid obesity with BMI of 60.0-69.9, adult    Goals of care, counseling/discussion    Palliative care encounter    Sepsis 04/27/2019   H/O insulin dependent diabetes mellitus 04/27/2019   History of CVA with residual deficit  04/27/2019   Seizure disorder 04/27/2019   Decubitus ulcer of sacral region, stage 4 04/27/2019    REFERRING DIAG: Cerebral infarction, unspecified   THERAPY DIAG:  Muscle weakness (generalized)  Other lack of coordination  Difficulty in walking, not elsewhere classified  History of CVA with residual deficit  Rationale for Evaluation and Treatment Rehabilitation  PERTINENT HISTORY: Paul Hall is a 25yoM who presents with severe weakness, quadriparesis, altered sensorium, and visual impairment s/p critical illness and prolonged hospitalization. Pt hospitalized in October 2020 with ARDS 2/2 COVID19 infection. Pt sustained a complex and lengthy hospitalization which included tracheostomy, prolonged sedation, ECMO. In this period pt sustained CVA and SDH. Pt has now been liberated from tracheostomy and G-tube. Pt has since been hospitalized for wound infection and UTI. Pt lives with parents at home, has hospital bed and left chair, hoyer lift transfers, and power WC for mobility needs. Pt needs heavy physical assistance with ADL 2/2 BUE contractures and motor dysfunction   PRECAUTIONS: Fall  SUBJECTIVE: Patient reports feeling better this week without any major issues.     Patient reports PAIN:   Are you having pain? No   TODAY'S TREATMENT:  -  *Dependent transfer using hoyer to transfer patient from power w/c to mat table- then patient assisted to sitting at [facility] (  hoyer pad underneath)    THEREX:   Trunk exercises- lateral flex down toward forearms- 15 reps each Abdominal crunch into lumbar ext- x 20 reps   Cross body reaching with turning head x 15 reps each side  Ankle- seated calf raises- x 15 reps    knee flex/ext AROM x 20 reps each LE - able to clear the floor and good slow motion  Scap retraction with RTB 2 sets of 15 reps  Seated tricep press down alt UE x 15 reps                 PATIENT EDUCATION: Education details: standing posture and  exercise technique Person educated: Patient Education method: Explanation, Demonstration, Tactile cues, and Verbal cues Education comprehension: verbalized understanding, returned demonstration, verbal cues required, tactile cues required, and needs further education   HOME EXERCISE PROGRAM:  Access Code: [email or web address] URL: [email or web address] Date: 03/23/2022 Prepared by: Maureen Ralphs  Exercises - Supine Bridge  - 3 x weekly - 3 sets - 10 reps - 2 hold - Supine Gluteal Sets  - 3 x weekly - 3 sets - 10 reps - 5 sec hold - Supine Quad Set  - 1 x daily - 3 x weekly - 3 sets - 10 reps - 5 hold - Seated Long Arc Quad  - 1 x daily - 7 x weekly - 3 sets - 10 reps - Seated Hip Adduction Squeeze with Ball  - 1 x daily - 3 x weekly - 3 sets - 10 reps - 5 hold - Seated Hip Abduction  - 1 x daily - 3 x weekly - 3 sets - 10 reps - 2 hold    PT Short Term Goals -       PT SHORT TERM GOAL #1   Title Pt will be independent with HEP in order to improve strength and balance in order to decrease fall risk and improve function at home and work.    Baseline 01/04/2021= No formal HEP in place; 12/12 no HEP in place; 05/10/2021-Patient and his father were able to report compliance with curent HEP consisting of mostly seated/reclined LE strengthening. Both verbalize no questions at this time.    Time 6    Period Weeks    Status Achieved    Target Date 02/15/21              PT Long Term Goals - Date for completion: 06/01/2022         PT LONG TERM GOAL #1   Title Patient will increase BLE gross strength by 1/2 muscle grade to improve functional strength for improved independence with potential gait, increased standing tolerance and increased ADL ability.    Baseline 01/04/2021- Patient presents with 1/5 to 3-/5 B LE strength with MMT; 12/12: goal partially met for Left knee/hip; 05/10/2021= 2-/5 bilateal Hip flex; 3+/5 bilateral Knee ext; 06/21/2021= Patient presents with 2-/5 bilateral  Hip flex; 3+/5 bilateral knee ext/flex; 2-/5 left ankle DF; 0/5 right ankle- and able to increase reps and resistance with LE's. 09/15/2021- Patient technically presents with 2-/5 B hip flex/abd/add - but he is able to raise his hip up to approx 100 deg which has improved. 3+/5 Bilateral knee ext, 2-/5 left ankle and 0/5 right ankle.  12/08/2021= Patient able to lift left knee at 110 deg of hip flex; presents with 3+/5 knee ext, 2-/5 left ankle DF and 0/5 right ankle DF, 2-/5 bilateral Hip abd in seated position.   12/6: R: knee 3+/5  ext, 2/5 flexion, left knee 3+/5 extension, 3+/5 flexion, R hip: 2+/5 hip add, 2+/5 hip ABD L hip: 4-/5 hip ABD, 3+/5 hip ADD, 3+/5 hip flexion; 06/06/2022= Patient now presents with 2-/5 right ankle DF/PF   Time 12    Period Weeks    Status MET   Target Date 03/07/2022     PT LONG TERM GOAL #2   Title Patient will tolerate sitting unsupported demonstrating erect sitting posture for 15 minutes with CGA to demonstrate improved back extensor strength and improved sitting tolerance.    Baseline 01/04/2021- Patient confied to sitting in lift chair or electric power chair with back support and unable to sit upright without physical assistance; 12/12: tolerates <1 minutes upright unsupported sitting. 05/10/2021=static sit with forward trunk lean  in his power wheelchair without back support x approx 3 min. 06/21/2021=Unable to assess today due to patient with acute back pain but on previous visit able to sit x 8 min without back support. 09/15/2021- on last visit- 09/13/2021- patient was able to sit unsupported x 8 min at edge of mat. 10/13/2023 - Patient was able to sit at edge of mat with varying level of assist today from SBA to min A for a total of 20 min. 12/13/2021= Patient demonstrated unsupported sitting at edge of mat for approx 20 min   Time 12    Period Weeks    Status GOAL MET   Target Date 12/08/21      PT LONG TERM GOAL #3   Title Patient will demonstrate ability to perform  static standing in // bars > 2 min with Max Assist  without loss of balance and fair posture for improved overall strength for pre-gait and transfer activities.    Baseline 01/04/2021= Patient current uanble to stand- Dependent on hoyer or sit to stand lift for transfers. 05/10/2021=Not appropriate yet- Currently still dependent with all transfers using hoyer. 06/21/2021= Patient continuing now to focus on LE strengthening to prepare for standing-unable to try today due to acute low back pain-  planning on attempting in new cert period. 09/15/2021- Patient has attempted standing 2x in past two week- max Assist of 2 people - only once was he successful to clearing his bottom from chair - Will continue to be a focus during the new certification. 12/13/2021= Patient has been limited secondary to increased overall low back pain during this certification and will require more time to focus on this goal.  12/6: not assessed this date, will assess at date when 2-3 PTs are present for assistance    Time 12    Period Weeks    Status GOAL not appropriate at this time - may attempt in future once Patient presents with improved overall LE strength.          PT LONG TERM GOAL #4   Title Pt will improve FOTO score by 10 points or more demonstrating improved perceived functional ability    Baseline FOTO 7 on 10/17; 03/15/21: FOTO 12; 05/10/2021 06/21/2021= 1; 09/15/2021= 9; 12/13/2021= Will issue next visit 12/6: 4; 06/06/2022= Will assess next visit   Time 12    Period Weeks    Status ONGOING   Target date 08/29/2022     PT LONG TERM GOAL #5   Title Patient will perform sit to stand transfer with appropriate AD and max assist of 2 people with 75% consistency to prepare for pregait activities.    Baseline 09/15/2021= Patient unable to stand well- unable to clear his bottom off  chair with Max assist of 2 persons. 12/13/2021- Goal not appropriate to try yet but will keep and roll over to next cert as shift continues to focus on  transfers/standing   Time 12    Period Weeks    Status Goal still not appropriate for now but will keep active for future   Target date    PT LONG TERM GOAL #6  Title Patient will tolerate sitting unsupported demonstrating erect sitting posture for 30 minutes with CGA to demonstrate improved back extensor strength and improved sitting tolerance.   Baseline 12/13/2021= Patient demonstrated unsupported sitting at edge of mat for approx 20 min;  12/29/2021- Patient performed approx 30 min of dynamic sitting activities today. 06/06/2022= Patient demonstrated ability to sit and perform static and dynamic UE/LE movement with only Supervision.   Time 12   Period Weeks   Status GOAL MET         7.  Patient will tolerate 5 minutes or more of standing in sit to stand lift in order to indicate improved lower extremity weightbearing tolerance for progression to standing in parallel bars. Baseline: 1 minute on most recent stand 02/21/22; 06/06/2022- Patient did attempt today after complaining of right LE pain last week. He attempted 3 stands using sit to stand lift. - all over 1 min- last one approx 48 sec- stopped due to fatigue. More erect standing in lift today - still poor gluteal strength but able to activate glutes and extend hips upon command but unable to hold > 5 sec.  Goal status: ONGOING- Progressing Target date: 08/29/2022  8. Patient will tolerate sitting unsupported demonstrating ability to perform dynamic UE/LE activities for 30 minutes  independently to demonstrate ability to sit at edge of bed to eat or perform some ADL's/exercise for optimal quality of life.  Baseline: 06/06/2022- Patient able to sit and perform some UE/LE exercises but requires CGA at times for safety- he is able to static sit for 30 min with supervision.   Goal Status: NEW  Target date: 08/29/2022       Plan     Clinical Impression Statement Patient arrived in good spirits today and performed well overall with all  strengthening- able to sit on his own and performed some dynamic balance activities and improved ankle and knee ROM with exercises- more active movement.   Pt will continue to benefit from skilled physical therapy intervention to address impairments, improve QOL, and attain therapy goals.    Personal Factors and Comorbidities Comorbidity 3+;Time since onset of injury/illness/exacerbation    Comorbidities CVA, diabetes, Seizures    Examination-Activity Limitations Bathing;Bed Mobility;Bend;Caring for Others;Carry;Dressing;Hygiene/Grooming;Lift;Locomotion Level;Reach Overhead;Self Feeding;Sit;Squat;Stairs;Stand;Transfers;Toileting    Examination-Participation Restrictions Cleaning;Community Activity;Driving;Laundry;Medication Management;Meal Prep;Occupation;Personal Finances;Shop;Yard Work;Volunteer    Stability/Clinical Decision Making Evolving/Moderate complexity    Rehab Potential Fair    PT Frequency 2x / week    PT Duration 12 weeks    PT Treatment/Interventions ADLs/Self Care Home Management;Cryotherapy;Electrical Stimulation;Moist Heat;Ultrasound;DME Instruction;Gait training;Stair training;Functional mobility training;Therapeutic exercise;Balance training;Patient/family education;Orthotic Fit/Training;Neuromuscular re-education;Wheelchair mobility training;Manual techniques;Passive range of motion;Dry needling;Energy conservation;Taping;Visual/perceptual remediation/compensation;Joint Manipulations    PT Next Visit Plan core strength- sitting at EOM, postural control, sit to stand if able; Continue with progressive LE Strengthening.    PT Home Exercise Plan No changes to HEP today    Consulted and Agree with Plan of Care Patient;Family member/caregiver    Family Member Consulted Mom             Louis Meckel, PT Physical Therapist- [facility]  [facility]  07/20/2022, 7:55 AM

## 2022-07-18 NOTE — Therapy (Signed)
Occupational Therapy Treatment Note  Patient Name: Paul Hall MRN: [id number] DOB:Jul 29, 1995, 27 y.o., male Today's Date: 07/18/2022  PCP: Dr. Sherwood Gambler REFERRING PROVIDER: Dr. Sherwood Gambler   OT End of Session - 07/18/22 1632     Visit Number 29    Number of Visits 36    Date for OT Re-Evaluation 08/17/22    OT Start Time 1645    OT Stop Time 1730    OT Time Calculation (min) 45 min    Equipment Utilized During Treatment tilt in space power wc    Activity Tolerance Patient tolerated treatment well    Behavior During Therapy WFL for tasks assessed/performed                     Past Medical History:  Diagnosis Date   Diabetes mellitus    Hypertension    Stroke    Past Surgical History:  Procedure Laterality Date   IVC FILTER PLACEMENT ([facility] HX)     LEG SURGERY     PEG TUBE PLACEMENT     TRACHEOSTOMY     Patient Active Problem List   Diagnosis Date Noted   Sepsis due to vancomycin resistant Enterococcus species 06/06/2019   SIRS (systemic inflammatory response syndrome) 06/05/2019   Acute lower UTI 06/05/2019   VRE (vancomycin-resistant Enterococci) infection 06/05/2019   Anemia 06/05/2019   Skin ulcer of sacrum with necrosis of muscle    Urinary retention    Type 2 diabetes mellitus without complication, with long-term current use of insulin    Tachycardia    Lower extremity edema    Acute metabolic encephalopathy    Obstructive sleep apnea    Morbid obesity with BMI of 60.0-69.9, adult    Goals of care, counseling/discussion    Palliative care encounter    Sepsis 04/27/2019   H/O insulin dependent diabetes mellitus 04/27/2019   History of CVA with residual deficit 04/27/2019   Seizure disorder 04/27/2019   Decubitus ulcer of sacral region, stage 4 04/27/2019    ONSET DATE: 01/2019  REFERRING DIAG: CVA/COVID-19  THERAPY DIAG:  Muscle weakness (generalized)  Other lack of coordination  History of CVA with residual deficit  Rationale for  Evaluation and Treatment Rehabilitation  SUBJECTIVE:   SUBJECTIVE STATEMENT:  Pt reports having a bad vision day.  Pt accompanied by: self and family member  PERTINENT HISTORY:  Pt. is a 27 y.o. male who was diagnosed with COVID-19, and CVA with resultant quadriplegia. Pt. Was then hospitalized with VRE UTI. PMHx includes: urinary retention, seizure disorder, obstructive sleep disorder, DM Type II, Morbid obesity.   PRECAUTIONS: None  WEIGHT BEARING RESTRICTIONS No  PAIN:  Are you having pain? No-soreness in right forearm from recent Botox.  FALLS: Has patient fallen in last 6 months? No  LIVING ENVIRONMENT: Lives with: lives with their family Lives in: House/apartment Stairs: No Level Entry Has following equipment at home: Wheelchair (power) and hoyer lift, sit to stand lift  PLOF: Independent  PATIENT GOALS: To be able to engage in more daily care tasks.  OBJECTIVE:   HAND DOMINANCE: Right  ADLs:  Transfers/ambulation related to ADLs: Eating: Pt. reports being able to hold standard utensils, and is starting to engage more in self-feeding tasks, hand to mouth patterns. Pt. Reports that he does as much as he can with the task, and family assists  with the remainder of the task. Grooming: Pt. Is able to initiate holding an electric toothbrush, and brush his teeth. Family assists LB  Dressing: Total Assist UE dressing: Pt. is now able to reach up to actively assist with grasping , and pulling his gown down. Toileting:  Total Assist Bathing: MaxA UB, Total assist LB Tub Shower transfers: N/A Equipment: See above    IADLs: Shopping: Relies on family to assist Light housekeeping: Total Assist Meal Prep: Total Assist Community mobility:   Medication management:  Total Assist  Financial management: N/A Handwriting: Not legible: Pt. Is able to hold a pen with the left hand, and initiate marking the page. Pt.'s eye glasses were not available  MOBILITY STATUS:  Power  w/c  POSTURE COMMENTS:  Pt. Requires position changes in his power w/c  ACTIVITY TOLERANCE: Activity tolerance:  Fair  FUNCTIONAL OUTCOME MEASURES: FOTO: 40  TR score: 45  05/25/2022:   FOTO: 50 TR score: 45   UPPER EXTREMITY ROM     Active ROM Right eval Right 03/21/2022 Right 05/25/2022  Left eval Left  03/21/2022 Left 05/25/2022  Shoulder flexion 106 scaption 105  Scaption 62 flexion 110 scaption  119 123 125  Shoulder abduction 114 90 97  110 115 115  Shoulder adduction         Shoulder extension         Shoulder internal rotation         Shoulder external rotation         Elbow flexion 120(130) 145 145  135(135) 145 145  Elbow extension -45(-35) -22(-35) -28(-22)  -27(-18) -21(-20) -20(-16)  Wrist flexion         Wrist extension -30(10) -25(10) -10(30)  10(50) 19(50) 28(50)  Wrist ulnar deviation         Wrist radial deviation         Wrist pronation         Wrist supination   Limited by flexor tone    Limited by flexor tone  (Blank rows = not tested)      UPPER EXTREMITY MMT:     Right eval Right 03/21/2022 Right  05/25/2022  Left eval Left 03/21/2022 Left  05/25/2022  Shoulder flexion 3-/5 scaption 3-/5 3-/5  3-/5 3/5 3+/5  Shoulder abduction 3-/5 3-/5 3-/5  3-/5 3-/5 4-/5  Shoulder adduction         Shoulder extension         Shoulder internal rotation         Shoulder external rotation         Elbow flexion 3/5 3/5 3+/5  3/5 3+/5 4-/5  Elbow extension 2-/5 2/5 3-/5  2-/5 2/5   Wrist flexion         Wrist extension 2-/5 2-/5 2/5  2/5 2+/5 3-/5  Wrist ulnar deviation         Wrist radial deviation         Wrist pronation         Wrist supination         (Blank rows = not tested)     HAND FUNCTION:  Grip strength: Right: 0 lbs; Left: 0 lbs and Lateral pinch: Right: 5 lbs, Left: 2 lbs  03/21/2022: Lateral pinch: Right: 3.5 lbs, Left: 2 lbs  Bilateral digit PIP/DIP flexion contractures with MP hyperextension with attempts for AROM. Pt. is  able to tolerate AROM to the bilateral digits at the initial evaluation however, has a history of pain in the digits.  COORDINATION:  Eval: Pt. is unable to grasp 9-hole test pegs. Pt. is able to initiate grasping larger pegs, and is able  to hold a pen in the left hand.  SENSATION: Light touch: Impaired   EDEMA:  N/A  MUSCLE TONE: BUE flexor Spasticity  COGNITION Overall cognitive status: Continue to assess in functional context  VISION:   Subjective report: Pt. was not wearing glasses at the time of the initial eval.  Baseline vision: Vision is very limited. Wears glasses all the time Visual history: History of impaired vision following CVA. Pt. Has received treatment through the [facility] low vision rehabilitation program.   VISION ASSESSMENT: Impaired To be further assessed in functional context  PERCEPTION: Impaired   PRAXIS: Impaired: motor planning  OBSERVATIONS:  Pt reports being on Tramadol   TODAY'S TREATMENT:    Therapeutic Exercise:   Pt tolerated AROM exercises on RUE for 2 set x 15 reps each - shoulder flexion, horizontal abduction, and elbow extension/flexion. Improved tolerance on LUE for 1 set body weight and 1 set using 3# dumbbell for above exercises.   Neuromuscular re-education: Pt focused on L and R grip removing 1/2inch knob pegs. Pt unable to grasp knobs out of Tupperware, removed from pegboard and placed into tupperware container ~1 foot away. Pt showed improved dexterity using L vs R hand.   Self care: MOD A to buckle seat belt on power w/c in sitting, assist for setup and tactile cues 2/2 visual deficits. Pt. worked on Engineer, structural names of therapists using his right hand requiring hand-over-hand assist, and visual cues. The whiteboard was positioned on an incline at the tabletop.  PATIENT EDUCATION: Education details: There. Halford Decamp Person educated: Patient and Parent Education method: Explanation, Demonstration, Tactile cues, and  Verbal cues Education comprehension: verbalized understanding, returned demonstration, verbal cues required, and needs further education   HOME EXERCISE PROGRAM:   Continue ongoing assessment, and continue to provide as needed.     GOALS: Goals reviewed with patient? Yes  SHORT TERM GOALS: Target date:05/03/2022     To assess splint fit, and make appropriate adjustments to promote good skin integrity through the palmar surface of the bilateral hands.  Baseline: 05/25/22: Goal currently met, however ongoing as needs to assess splint fit arise. 03/23/2022: Pt. is wearing splints a couple of hours at night bilateral resting hand splints. 03/21/2022: Pt. is wearing splints a couple of hours at night bilateral resting hand splints. Goal status: Continue ongoing assessment as requested, and as needed   LONG TERM GOALS: Target date: 06/13/2022     FOTO score will Improve by 2 points for Pt. perceived improvement with the assessment specific ADL/IADL tasks.  Baseline: 05/25/2022: FOTO score: 50,  TR score: 45 Eval: FOTO score: 40,  TR score: 45 Goal status: Achieved   2.   Pt. will independently perform oral care for 100% of the task after complete set-up. Baseline: 05/25/2022:  Pt. Is able to initiate and perform oral care for approximately 90% of the task. Complete set-up required. Assi needed only for the very back teeth. 03/23/2022: Pt. Is able to initiate and perform oral care for approximately 75% of the task. Pt. Requires assist at proximally at the elbow for through oral care. 03/21/2022: Pt. Is able to initiate and perform oral care for approximately 75% of the task. Pt. Requires assist at proximally at the elbow for through oral care. Eval: Pt. is able to initiate using an electric toothbrush. Pt. requires assist for set-up, and assist for thoroughness, and as he Pt. fatigues. Goal status: Ongoing  3.  Pt. Will be independent with self-feeding for 100% of  the meal after complete  set-up Baseline: 05/25/2022: Pt. Is able to use a spoon to scoop cereal when feeding himself cereal 85% of the time. Pt. Is able to feed himself snack/finger foods 100% of the time. Pt. Continues to work on consistency of  stabilizing a cup/mug when drinking. Pt. Is able to grasp a water bottle with assist initially, with assist tapering off as he drinks.03/23/2022: Pt. Is able to perform scooping cereal for 75% of the time. Pt. required assist, and support at the left elbow, and Pt. Presents with limited forearm supination when using the spoon, and bringing it towards his mouth. Pt. Is able to use a fork to spear items, and perform the hand to mouth pattern.  03/21/2022: Pt. Is able to perform scooping cereal for 75% of the time. Pt. required assist, and support at the left elbow, and Pt. presents with limited forearm supination when using the spoon, and bringing it towards his mouth. Pt. Is able to use a fork to spear items, and perform the hand to mouth pattern.  Eval: Pt. is able to hold standard standard utensils. Pt. Performs as much of the task as he, can and has assistance for the remainder. Goal status: Ongoing  4.  Pt. Will improve grasp patterns and consistently grasp 1/2" objects for ADL, and IADL tasks.  Baseline:05/25/2022: Pt. Is working on improving consistency of grasping 1/2" objects with visual cues.  03/23/2022: Pt. Is able to grasp 1" objects consistently,and continues to work on the hand patterns needed to grasp 1/2" objects.03/21/2022: Pt. Is able to grasp 1" objects consistently,and continues to work on the hand patterns needed to grasp 1/2" objects. Eval: Pt. is able to grasp 1" objects intermittently using a lateral grasp pattern. Goal status: Ongoing  5.  Pt. will independently write his name legibly with letter sizes under 2". Baseline: 05/25/2022: Pt. to continue to work towards formulating grasp patterns in preparation for grasping a large width pen. Pt. Requires visual cues.  03/23/2022: Pt. Is able to write his name with modA, however has difficulty with formulating letter sizes less than 2" in size with 50% legibility for the 3 letters of his name.03/21/2022: Pt. Is able to write his name with modA, however has difficulty with formulating letter sizes less than 2" in size with 50% legibility for the 3 letters of his name. Eval: Pt is able to hold a thin marker with his left hand, and formulate a line, and initiate a circular pattern (Pt. without glasses today) Goal status: Ongoing  6. Pt. Will reach up to comb/brush his hair  with minA.  Baseline: 05/25/2022: Pt. Is able to reach up with the left hand to the left side, top, and back of his head. 03/23/2022: Pt. is now able to more consistently initiate reaching up to his head with his left hand in preparation for haircare12/18/2023: Pt. is now able to more consistently initiate reaching up to his head with his left hand in preparation for haircare. Eval: Pt. is able to initiate reaching up for hair care with a long handled brush, however is unable to sustain UE's in elevation to perform the task.     Goal status: Ongoing  7. Pt. Will independently navigate the w/c through his environment with minA with visual scanning, and hand placement on the controls.  Baseline: 05/25/2022: Pt. Requires minA  and  cues to navigate the w/c in wide spaces, and requires Mod cues to navigate the w/c through more narrow doorways, and tighter  turns. Pt. Requires max cues for scanning through the environment, and moderate cues for hand placement on the controls.     Goal status:Ongoing       ASSESSMENT:  CLINICAL IMPRESSION:   Pt. required less assist for left hand placement on the plastic bottle while drinking from it, as well as less assist to hold the bottle in place when tilting his head back to drink from the bottle. Pt initiated hand movements when formulating letters to write 3 names. Pt required hand-over-hand assist writing on  whiteboard at an incline. Pt removed x20 1/2 inch knob pegs from peg board and placed into container. Pt is improving with consistency, and accuracy during functional reaching, L greater than R. Requires increased visual cues as pt reports having a bad vision day. Pt. continues to work on improving BUE functioning, w/c navigation, and visual compensatory strategies to improve, and maximize independence with ADLs, and IADLs.     PERFORMANCE DEFICITS in functional skills including ADLs, IADLs, coordination, dexterity, proprioception, ROM, strength, pain, FMC, GMC, decreased knowledge of use of DME, and UE functional use, cognitive skills including safety awareness, and psychosocial skills including coping strategies, environmental adaptation, habits, and routines and behaviors.   IMPAIRMENTS are limiting patient from ADLs, IADLs, leisure, and social participation.   COMORBIDITIES may have co-morbidities  that affects occupational performance. Patient will benefit from skilled OT to address above impairments and improve overall function.  MODIFICATION OR ASSISTANCE TO COMPLETE EVALUATION: Maximum or significant modification of tasks or assist is necessary to complete an evaluation.  OT OCCUPATIONAL PROFILE AND HISTORY: Comprehensive assessment: Review of records and extensive additional review of physical, cognitive, psychosocial history related to current functional performance.  CLINICAL DECISION MAKING: High - multiple treatment options, significant modification of task necessary  REHAB POTENTIAL: Fair    EVALUATION COMPLEXITY: High    PLAN: OT FREQUENCY: 2 x's a week  OT DURATION:12 weeks  PLANNED INTERVENTIONS: self care/ADL training, therapeutic exercise, therapeutic activity, neuromuscular re-education, manual therapy, passive range of motion, patient/family education, and cognitive remediation/compensation  RECOMMENDED OTHER SERVICES: PT  CONSULTED AND AGREED WITH PLAN OF CARE:  Patient and family member/caregiver  PLAN FOR NEXT SESSION: Initiate treatment   Kathie Dike, M.S. OTR/L  07/18/22, 4:33 PM  ascom [phone]  07/18/2022, 4:33 PM

## 2022-07-20 ENCOUNTER — Ambulatory Visit: Payer: BC Managed Care – PPO

## 2022-07-20 ENCOUNTER — Encounter: Payer: Self-pay | Admitting: Occupational Therapy

## 2022-07-20 ENCOUNTER — Other Ambulatory Visit: Payer: Self-pay | Admitting: Dermatology

## 2022-07-20 ENCOUNTER — Ambulatory Visit: Payer: BC Managed Care – PPO | Admitting: Occupational Therapy

## 2022-07-20 DIAGNOSIS — M6281 Muscle weakness (generalized): Secondary | ICD-10-CM

## 2022-07-20 DIAGNOSIS — I6930 Unspecified sequelae of cerebral infarction: Secondary | ICD-10-CM | POA: Diagnosis not present

## 2022-07-20 DIAGNOSIS — R278 Other lack of coordination: Secondary | ICD-10-CM | POA: Diagnosis not present

## 2022-07-20 DIAGNOSIS — R269 Unspecified abnormalities of gait and mobility: Secondary | ICD-10-CM | POA: Diagnosis not present

## 2022-07-20 DIAGNOSIS — R262 Difficulty in walking, not elsewhere classified: Secondary | ICD-10-CM

## 2022-07-20 DIAGNOSIS — R2681 Unsteadiness on feet: Secondary | ICD-10-CM

## 2022-07-20 DIAGNOSIS — L732 Hidradenitis suppurativa: Secondary | ICD-10-CM

## 2022-07-20 DIAGNOSIS — H543 Unqualified visual loss, both eyes: Secondary | ICD-10-CM | POA: Diagnosis not present

## 2022-07-20 NOTE — Therapy (Signed)
OUTPATIENT PHYSICAL THERAPY TREATMENT NOTE  Patient Name: Paul Hall MRN: [id number] DOB:September 05, 1995, 27 y.o., male Today's Date: 07/20/2022  PCP:  Cindi Carbon PROVIDER:  Lenise Herald, PA-C   PT End of Session - 07/20/22 1658     Visit Number 98    Number of Visits 114    Date for PT Re-Evaluation 08/29/22    Authorization Type [employer] and [employer]- Need auth past 12th visit    Authorization Time Period 01/04/21-03/29/21; Recert 03/24/2021-06/16/2021; Recert 09/15/2021- 12/08/2021; Recert 12/13/2021-03/07/2022    Progress Note Due on Visit 100    PT Start Time 1601    PT Stop Time 1644    PT Time Calculation (min) 43 min    Equipment Utilized During Treatment Gait belt   Hoyer lift   Activity Tolerance Patient tolerated treatment well   queasiness/nausea   Behavior During Therapy WFL for tasks assessed/performed                           Past Medical History:  Diagnosis Date   Diabetes mellitus    Hypertension    Stroke    Past Surgical History:  Procedure Laterality Date   IVC FILTER PLACEMENT ([facility] HX)     LEG SURGERY     PEG TUBE PLACEMENT     TRACHEOSTOMY     Patient Active Problem List   Diagnosis Date Noted   Sepsis due to vancomycin resistant Enterococcus species 06/06/2019   SIRS (systemic inflammatory response syndrome) 06/05/2019   Acute lower UTI 06/05/2019   VRE (vancomycin-resistant Enterococci) infection 06/05/2019   Anemia 06/05/2019   Skin ulcer of sacrum with necrosis of muscle    Urinary retention    Type 2 diabetes mellitus without complication, with long-term current use of insulin    Tachycardia    Lower extremity edema    Acute metabolic encephalopathy    Obstructive sleep apnea    Morbid obesity with BMI of 60.0-69.9, adult    Goals of care, counseling/discussion    Palliative care encounter    Sepsis 04/27/2019   H/O insulin dependent diabetes mellitus 04/27/2019   History of CVA with residual deficit  04/27/2019   Seizure disorder 04/27/2019   Decubitus ulcer of sacral region, stage 4 04/27/2019    REFERRING DIAG: Cerebral infarction, unspecified   THERAPY DIAG:  Muscle weakness (generalized)  Other lack of coordination  History of CVA with residual deficit  Difficulty in walking, not elsewhere classified  Unsteadiness on feet  Rationale for Evaluation and Treatment Rehabilitation  PERTINENT HISTORY: Paul Hall is a 25yoM who presents with severe weakness, quadriparesis, altered sensorium, and visual impairment s/p critical illness and prolonged hospitalization. Pt hospitalized in October 2020 with ARDS 2/2 COVID19 infection. Pt sustained a complex and lengthy hospitalization which included tracheostomy, prolonged sedation, ECMO. In this period pt sustained CVA and SDH. Pt has now been liberated from tracheostomy and G-tube. Pt has since been hospitalized for wound infection and UTI. Pt lives with parents at home, has hospital bed and left chair, hoyer lift transfers, and power WC for mobility needs. Pt needs heavy physical assistance with ADL 2/2 BUE contractures and motor dysfunction   PRECAUTIONS: Fall  SUBJECTIVE: Patient continues to report having a good week with no significant issues. Reports his parents are assisting him in standing and they are using more +2 person assist from max height of lift chair. He reports he can really feel the pressure in his feet and  his knees. .     Patient reports PAIN:   Are you having pain? No   TODAY'S TREATMENT:  -  *Dependent transfer using hoyer to transfer patient from power w/c to mat table- then patient assisted to sitting at [facility] (hoyer pad underneath)    Therapeutic Activities   Standing at [facility]- multiple attempts today with some issues with support straps-difficulty obtaining secure fit initially yet with some work - able to secure well enough to stand well and patient experienced his best stand to date- able to stand with  mostly erect standing posture and bottom cleared well off mat-and knees were secured within knee pads well- He was able to stand x 3 trials with best effort at 4:23 and other 2 approx 2 min each.       *Dependent transfer using hoyer to transfer patient from mat table to power w/c- then patient positioned in chair for comfort.         PATIENT EDUCATION: Education details: standing posture and exercise technique Person educated: Patient Education method: Explanation, Demonstration, Tactile cues, and Verbal cues Education comprehension: verbalized understanding, returned demonstration, verbal cues required, tactile cues required, and needs further education   HOME EXERCISE PROGRAM:  Access Code: [email or web address] URL: [email or web address] Date: 03/23/2022 Prepared by: Maureen Ralphs  Exercises - Supine Bridge  - 3 x weekly - 3 sets - 10 reps - 2 hold - Supine Gluteal Sets  - 3 x weekly - 3 sets - 10 reps - 5 sec hold - Supine Quad Set  - 1 x daily - 3 x weekly - 3 sets - 10 reps - 5 hold - Seated Long Arc Quad  - 1 x daily - 7 x weekly - 3 sets - 10 reps - Seated Hip Adduction Squeeze with Ball  - 1 x daily - 3 x weekly - 3 sets - 10 reps - 5 hold - Seated Hip Abduction  - 1 x daily - 3 x weekly - 3 sets - 10 reps - 2 hold    PT Short Term Goals -       PT SHORT TERM GOAL #1   Title Pt will be independent with HEP in order to improve strength and balance in order to decrease fall risk and improve function at home and work.    Baseline 01/04/2021= No formal HEP in place; 12/12 no HEP in place; 05/10/2021-Patient and his father were able to report compliance with curent HEP consisting of mostly seated/reclined LE strengthening. Both verbalize no questions at this time.    Time 6    Period Weeks    Status Achieved    Target Date 02/15/21              PT Long Term Goals - Date for completion: 06/01/2022         PT LONG TERM GOAL #1   Title Patient will  increase BLE gross strength by 1/2 muscle grade to improve functional strength for improved independence with potential gait, increased standing tolerance and increased ADL ability.    Baseline 01/04/2021- Patient presents with 1/5 to 3-/5 B LE strength with MMT; 12/12: goal partially met for Left knee/hip; 05/10/2021= 2-/5 bilateal Hip flex; 3+/5 bilateral Knee ext; 06/21/2021= Patient presents with 2-/5 bilateral Hip flex; 3+/5 bilateral knee ext/flex; 2-/5 left ankle DF; 0/5 right ankle- and able to increase reps and resistance with LE's. 09/15/2021- Patient technically presents with 2-/5 B hip flex/abd/add - but he is able to raise his  hip up to approx 100 deg which has improved. 3+/5 Bilateral knee ext, 2-/5 left ankle and 0/5 right ankle.  12/08/2021= Patient able to lift left knee at 110 deg of hip flex; presents with 3+/5 knee ext, 2-/5 left ankle DF and 0/5 right ankle DF, 2-/5 bilateral Hip abd in seated position.   12/6: R: knee 3+/5 ext, 2/5 flexion, left knee 3+/5 extension, 3+/5 flexion, R hip: 2+/5 hip add, 2+/5 hip ABD L hip: 4-/5 hip ABD, 3+/5 hip ADD, 3+/5 hip flexion; 06/06/2022= Patient now presents with 2-/5 right ankle DF/PF   Time 12    Period Weeks    Status MET   Target Date 03/07/2022     PT LONG TERM GOAL #2   Title Patient will tolerate sitting unsupported demonstrating erect sitting posture for 15 minutes with CGA to demonstrate improved back extensor strength and improved sitting tolerance.    Baseline 01/04/2021- Patient confied to sitting in lift chair or electric power chair with back support and unable to sit upright without physical assistance; 12/12: tolerates <1 minutes upright unsupported sitting. 05/10/2021=static sit with forward trunk lean  in his power wheelchair without back support x approx 3 min. 06/21/2021=Unable to assess today due to patient with acute back pain but on previous visit able to sit x 8 min without back support. 09/15/2021- on last visit- 09/13/2021- patient  was able to sit unsupported x 8 min at edge of mat. 10/13/2023 - Patient was able to sit at edge of mat with varying level of assist today from SBA to min A for a total of 20 min. 12/13/2021= Patient demonstrated unsupported sitting at edge of mat for approx 20 min   Time 12    Period Weeks    Status GOAL MET   Target Date 12/08/21      PT LONG TERM GOAL #3   Title Patient will demonstrate ability to perform static standing in // bars > 2 min with Max Assist  without loss of balance and fair posture for improved overall strength for pre-gait and transfer activities.    Baseline 01/04/2021= Patient current uanble to stand- Dependent on hoyer or sit to stand lift for transfers. 05/10/2021=Not appropriate yet- Currently still dependent with all transfers using hoyer. 06/21/2021= Patient continuing now to focus on LE strengthening to prepare for standing-unable to try today due to acute low back pain-  planning on attempting in new cert period. 09/15/2021- Patient has attempted standing 2x in past two week- max Assist of 2 people - only once was he successful to clearing his bottom from chair - Will continue to be a focus during the new certification. 12/13/2021= Patient has been limited secondary to increased overall low back pain during this certification and will require more time to focus on this goal.  12/6: not assessed this date, will assess at date when 2-3 PTs are present for assistance    Time 12    Period Weeks    Status GOAL not appropriate at this time - may attempt in future once Patient presents with improved overall LE strength.          PT LONG TERM GOAL #4   Title Pt will improve FOTO score by 10 points or more demonstrating improved perceived functional ability    Baseline FOTO 7 on 10/17; 03/15/21: FOTO 12; 05/10/2021 06/21/2021= 1; 09/15/2021= 9; 12/13/2021= Will issue next visit 12/6: 4; 06/06/2022= Will assess next visit   Time 12    Period Weeks  Status ONGOING   Target date 08/29/2022      PT LONG TERM GOAL #5   Title Patient will perform sit to stand transfer with appropriate AD and max assist of 2 people with 75% consistency to prepare for pregait activities.    Baseline 09/15/2021= Patient unable to stand well- unable to clear his bottom off chair with Max assist of 2 persons. 12/13/2021- Goal not appropriate to try yet but will keep and roll over to next cert as shift continues to focus on transfers/standing   Time 12    Period Weeks    Status Goal still not appropriate for now but will keep active for future   Target date    PT LONG TERM GOAL #6  Title Patient will tolerate sitting unsupported demonstrating erect sitting posture for 30 minutes with CGA to demonstrate improved back extensor strength and improved sitting tolerance.   Baseline 12/13/2021= Patient demonstrated unsupported sitting at edge of mat for approx 20 min;  12/29/2021- Patient performed approx 30 min of dynamic sitting activities today. 06/06/2022= Patient demonstrated ability to sit and perform static and dynamic UE/LE movement with only Supervision.   Time 12   Period Weeks   Status GOAL MET         7.  Patient will tolerate 5 minutes or more of standing in sit to stand lift in order to indicate improved lower extremity weightbearing tolerance for progression to standing in parallel bars. Baseline: 1 minute on most recent stand 02/21/22; 06/06/2022- Patient did attempt today after complaining of right LE pain last week. He attempted 3 stands using sit to stand lift. - all over 1 min- last one approx 48 sec- stopped due to fatigue. More erect standing in lift today - still poor gluteal strength but able to activate glutes and extend hips upon command but unable to hold > 5 sec.  Goal status: ONGOING- Progressing Target date: 08/29/2022  8. Patient will tolerate sitting unsupported demonstrating ability to perform dynamic UE/LE activities for 30 minutes  independently to demonstrate ability to sit at edge  of bed to eat or perform some ADL's/exercise for optimal quality of life.  Baseline: 06/06/2022- Patient able to sit and perform some UE/LE exercises but requires CGA at times for safety- he is able to static sit for 30 min with supervision.   Goal Status: NEW  Target date: 08/29/2022       Plan     Clinical Impression Statement Patient presents with good motivation today and able to stand well overall today- exhibiting both improved overall posture and standing duration today. He even reported feeling better with standing and more confident today.    Pt will continue to benefit from skilled physical therapy intervention to address impairments, improve QOL, and attain therapy goals.    Personal Factors and Comorbidities Comorbidity 3+;Time since onset of injury/illness/exacerbation    Comorbidities CVA, diabetes, Seizures    Examination-Activity Limitations Bathing;Bed Mobility;Bend;Caring for Others;Carry;Dressing;Hygiene/Grooming;Lift;Locomotion Level;Reach Overhead;Self Feeding;Sit;Squat;Stairs;Stand;Transfers;Toileting    Examination-Participation Restrictions Cleaning;Community Activity;Driving;Laundry;Medication Management;Meal Prep;Occupation;Personal Finances;Shop;Yard Work;Volunteer    Stability/Clinical Decision Making Evolving/Moderate complexity    Rehab Potential Fair    PT Frequency 2x / week    PT Duration 12 weeks    PT Treatment/Interventions ADLs/Self Care Home Management;Cryotherapy;Electrical Stimulation;Moist Heat;Ultrasound;DME Instruction;Gait training;Stair training;Functional mobility training;Therapeutic exercise;Balance training;Patient/family education;Orthotic Fit/Training;Neuromuscular re-education;Wheelchair mobility training;Manual techniques;Passive range of motion;Dry needling;Energy conservation;Taping;Visual/perceptual remediation/compensation;Joint Manipulations    PT Next Visit Plan core strength- sitting at EOM, postural control, sit to stand if  able; Continue  with progressive LE Strengthening.    PT Home Exercise Plan No changes to HEP today    Consulted and Agree with Plan of Care Patient;Family member/caregiver    Family Member Consulted Mom             Louis Meckel, PT Physical Therapist- [facility]  [facility]  07/20/2022, 4:59 PM

## 2022-07-20 NOTE — Therapy (Signed)
Occupational Therapy Progress Note  Patient Name: Paul Hall MRN: [id number] DOB:1995/07/26, 27 y.o., male Today's Date: 07/20/2022  PCP: Dr. Sherwood Gambler REFERRING PROVIDER: Dr. Sherwood Gambler   OT End of Session - 07/20/22 1642     Visit Number 30    Number of Visits 36    Date for OT Re-Evaluation 08/17/22    OT Start Time 1645    OT Stop Time 1730    OT Time Calculation (min) 45 min    Equipment Utilized During Treatment tilt in space power wc    Activity Tolerance Patient tolerated treatment well    Behavior During Therapy WFL for tasks assessed/performed                     Past Medical History:  Diagnosis Date   Diabetes mellitus    Hypertension    Stroke    Past Surgical History:  Procedure Laterality Date   IVC FILTER PLACEMENT ([facility] HX)     LEG SURGERY     PEG TUBE PLACEMENT     TRACHEOSTOMY     Patient Active Problem List   Diagnosis Date Noted   Sepsis due to vancomycin resistant Enterococcus species 06/06/2019   SIRS (systemic inflammatory response syndrome) 06/05/2019   Acute lower UTI 06/05/2019   VRE (vancomycin-resistant Enterococci) infection 06/05/2019   Anemia 06/05/2019   Skin ulcer of sacrum with necrosis of muscle    Urinary retention    Type 2 diabetes mellitus without complication, with long-term current use of insulin    Tachycardia    Lower extremity edema    Acute metabolic encephalopathy    Obstructive sleep apnea    Morbid obesity with BMI of 60.0-69.9, adult    Goals of care, counseling/discussion    Palliative care encounter    Sepsis 04/27/2019   H/O insulin dependent diabetes mellitus 04/27/2019   History of CVA with residual deficit 04/27/2019   Seizure disorder 04/27/2019   Decubitus ulcer of sacral region, stage 4 04/27/2019    ONSET DATE: 01/2019  REFERRING DIAG: CVA/COVID-19  THERAPY DIAG:  Muscle weakness (generalized)  History of CVA with residual deficit  Other lack of coordination  Rationale for  Evaluation and Treatment Rehabilitation  SUBJECTIVE:   SUBJECTIVE STATEMENT:  Pt reports having a great day at PT.  Pt accompanied by: self and family member  PERTINENT HISTORY:  Pt. is a 27 y.o. male who was diagnosed with COVID-19, and CVA with resultant quadriplegia. Pt. Was then hospitalized with VRE UTI. PMHx includes: urinary retention, seizure disorder, obstructive sleep disorder, DM Type II, Morbid obesity.   PRECAUTIONS: None  WEIGHT BEARING RESTRICTIONS No  PAIN:  Are you having pain? No-soreness in right forearm from recent Botox.  FALLS: Has patient fallen in last 6 months? No  LIVING ENVIRONMENT: Lives with: lives with their family Lives in: House/apartment Stairs: No Level Entry Has following equipment at home: Wheelchair (power) and hoyer lift, sit to stand lift  PLOF: Independent  PATIENT GOALS: To be able to engage in more daily care tasks.  OBJECTIVE:   HAND DOMINANCE: Right  ADLs:  Transfers/ambulation related to ADLs: Eating: Pt. reports being able to hold standard utensils, and is starting to engage more in self-feeding tasks, hand to mouth patterns. Pt. Reports that he does as much as he can with the task, and family assists  with the remainder of the task. Grooming: Pt. Is able to initiate holding an electric toothbrush, and brush his teeth. Family assists  LB Dressing: Total Assist UE dressing: Pt. is now able to reach up to actively assist with grasping , and pulling his gown down. Toileting:  Total Assist Bathing: MaxA UB, Total assist LB Tub Shower transfers: N/A Equipment: See above    IADLs: Shopping: Relies on family to assist Light housekeeping: Total Assist Meal Prep: Total Assist Community mobility:   Medication management:  Total Assist  Financial management: N/A Handwriting: Not legible: Pt. Is able to hold a pen with the left hand, and initiate marking the page. Pt.'s eye glasses were not available  MOBILITY STATUS:  Power  w/c  POSTURE COMMENTS:  Pt. Requires position changes in his power w/c  ACTIVITY TOLERANCE: Activity tolerance:  Fair  FUNCTIONAL OUTCOME MEASURES: FOTO: 40  TR score: 45  05/25/2022:   FOTO: 50 TR score: 45  07/20/22: FOTO 55  UPPER EXTREMITY ROM     Active ROM Right eval Right 03/21/2022 Right 05/25/2022 R  07/20/22  Left eval Left  03/21/2022 Left 05/25/2022 L  07/20/22  Shoulder flexion 106 scaption 105  Scaption 62 flexion 110 scaption 100  119 123 125 130  Shoulder abduction 114 90 97 100  110 115 115 115  Shoulder adduction           Shoulder extension           Shoulder internal rotation           Shoulder external rotation           Elbow flexion 120(130) 145 145 140  135(135) 145 145   Elbow extension -45(-35) -22(-35) -28(-22)   -27(-18) -21(-20) -20(-16)   Wrist flexion           Wrist extension -30(10) -25(10) -10(30)   10(50) 19(50) 28(50)   Wrist ulnar deviation           Wrist radial deviation           Wrist pronation           Wrist supination   Limited by flexor tone     Limited by flexor tone   (Blank rows = not tested)      UPPER EXTREMITY MMT:     Right eval Right 03/21/2022 Right  05/25/2022 R 07/20/22  Left eval Left 03/21/2022 Left  05/25/2022 L 07/20/22  Shoulder flexion 3-/5 scaption 3-/5 3-/5 3+/5  3-/5 3/5 3+/5 4+/5  Shoulder abduction 3-/5 3-/5 3-/5 4/5  3-/5 3-/5 4-/5 4+/5  Shoulder adduction           Shoulder extension           Shoulder internal rotation           Shoulder external rotation           Elbow flexion 3/5 3/5 3+/5 4/5  3/5 3+/5 4-/5 4+/5  Elbow extension 2-/5 2/5 3-/5 4+/5  2-/5 2/5  4+/5  Wrist flexion           Wrist extension 2-/5 2-/5 2/5 2/5  2/5 2+/5 3-/5   Wrist ulnar deviation           Wrist radial deviation           Wrist pronation           Wrist supination           (Blank rows = not tested)     HAND FUNCTION:  Grip strength: Right: 0 lbs; Left: 0 lbs and Lateral pinch: Right: 5 lbs, Left:  2  lbs  03/21/2022: Lateral pinch: Right: 3.5 lbs, Left: 2 lbs  07/20/22: Grip strength: Right: 3 lbs; Left: 4 lbs and Lateral pinch: Right: 5 lbs, Left: 3 lbs  Bilateral digit PIP/DIP flexion contractures with MP hyperextension with attempts for AROM. Pt. is able to tolerate AROM to the bilateral digits at the initial evaluation however, has a history of pain in the digits.  COORDINATION:  Eval: Pt. is unable to grasp 9-hole test pegs. Pt. is able to initiate grasping larger pegs, and is able to hold a pen in the left hand.  07/20/22: 2 min 36 seconds to remove 9 pegs from 9 hole peg test - cues to locate pegs 2/2 low vision. Pt. is able to initiate grasping larger pegs on R hand and is able to hold a pen in the left hand.  SENSATION: Light touch: Impaired   EDEMA:  N/A  MUSCLE TONE: BUE flexor Spasticity  COGNITION Overall cognitive status: Continue to assess in functional context  VISION:   Subjective report: Pt. was not wearing glasses at the time of the initial eval.  Baseline vision: Vision is very limited. Wears glasses all the time Visual history: History of impaired vision following CVA. Pt. Has received treatment through the [facility] low vision rehabilitation program.   VISION ASSESSMENT: Impaired To be further assessed in functional context  PERCEPTION: Impaired   PRAXIS: Impaired: motor planning  OBSERVATIONS:  Pt reports being on Tramadol   TODAY'S TREATMENT:   Measurements obtained and goals reviewed.   Self care: MOD A to buckle seat belt on power w/c in sitting, assist for setup and tactile cues 2/2 visual deficits. Pt grasped plastic water bottle and completed self-drinking with setup assist only. SETUP to place chewing gum in mouth.   PATIENT EDUCATION: Education details: There. Halford Decamp Person educated: Patient and Parent Education method: Explanation, Demonstration, Tactile cues, and Verbal cues Education comprehension: verbalized understanding,  returned demonstration, verbal cues required, and needs further education   HOME EXERCISE PROGRAM:   Continue ongoing assessment, and continue to provide as needed.     GOALS: Goals reviewed with patient? Yes  SHORT TERM GOALS: Target date:05/03/2022     To assess splint fit, and make appropriate adjustments to promote good skin integrity through the palmar surface of the bilateral hands.  Baseline: 05/25/22: Goal currently met, however ongoing as needs to assess splint fit arise. 03/23/2022: Pt. is wearing splints a couple of hours at night bilateral resting hand splints. 03/21/2022: Pt. is wearing splints a couple of hours at night bilateral resting hand splints. Goal status: Continue ongoing assessment as requested, and as needed   LONG TERM GOALS: Target date: 06/13/2022     FOTO score will Improve by 2 points for Pt. perceived improvement with the assessment specific ADL/IADL tasks.  Baseline: 05/25/2022: FOTO score: 50,  TR score: 45 Eval: FOTO score: 40,  TR score: 45 Goal status: Achieved   2.   Pt. will independently perform oral care for 100% of the task after complete set-up. Baseline: 07/20/22: completes 90% of task, limited by shoulder flexion. 05/25/2022:  Pt. Is able to initiate and perform oral care for approximately 90% of the task. Complete set-up required. Assi needed only for the very back teeth. 03/23/2022: Pt. Is able to initiate and perform oral care for approximately 75% of the task. Pt. Requires assist at proximally at the elbow for through oral care. 03/21/2022: Pt. Is able to initiate and perform oral care for approximately 75% of the  task. Pt. Requires assist at proximally at the elbow for through oral care. Eval: Pt. is able to initiate using an electric toothbrush. Pt. requires assist for set-up, and assist for thoroughness, and as he Pt. fatigues. Goal status: Ongoing  3.  Pt. Will be independent with self-feeding for 100% of the meal after complete  set-up Baseline: 07/20/22: self-feeds cereal using spoon 90% of task. 05/25/2022: Pt. Is able to use a spoon to scoop cereal when feeding himself cereal 85% of the time. Pt. Is able to feed himself snack/finger foods 100% of the time. Pt. Continues to work on consistency of  stabilizing a cup/mug when drinking. Pt. Is able to grasp a water bottle with assist initially, with assist tapering off as he drinks.03/23/2022: Pt. Is able to perform scooping cereal for 75% of the time. Pt. required assist, and support at the left elbow, and Pt. Presents with limited forearm supination when using the spoon, and bringing it towards his mouth. Pt. Is able to use a fork to spear items, and perform the hand to mouth pattern.  03/21/2022: Pt. Is able to perform scooping cereal for 75% of the time. Pt. required assist, and support at the left elbow, and Pt. presents with limited forearm supination when using the spoon, and bringing it towards his mouth. Pt. Is able to use a fork to spear items, and perform the hand to mouth pattern.  Eval: Pt. is able to hold standard standard utensils. Pt. Performs as much of the task as he, can and has assistance for the remainder. Goal status: Ongoing  4.  Pt. Will improve grasp patterns and consistently grasp 1/2" objects for ADL, and IADL tasks.  Baseline: 07/20/22: grasps 1/4" pegs with min a + visual cues, consistently grasping 1/2" objects with visual cues. 05/25/2022: Pt. Is working on improving consistency of grasping 1/2" objects with visual cues.  03/23/2022: Pt. Is able to grasp 1" objects consistently,and continues to work on the hand patterns needed to grasp 1/2" objects.03/21/2022: Pt. Is able to grasp 1" objects consistently,and continues to work on the hand patterns needed to grasp 1/2" objects. Eval: Pt. is able to grasp 1" objects intermittently using a lateral grasp pattern. Goal status: Ongoing  5.  Pt. will independently write his name legibly with letter sizes under  2". Baseline: 07/20/22: stabilizing assist to write name with 75% legibility with 2" letters. 05/25/2022: Pt. to continue to work towards formulating grasp patterns in preparation for grasping a large width pen. Pt. Requires visual cues. 03/23/2022: Pt. Is able to write his name with modA, however has difficulty with formulating letter sizes less than 2" in size with 50% legibility for the 3 letters of his name.03/21/2022: Pt. Is able to write his name with modA, however has difficulty with formulating letter sizes less than 2" in size with 50% legibility for the 3 letters of his name. Eval: Pt is able to hold a thin marker with his left hand, and formulate a line, and initiate a circular pattern (Pt. without glasses today) Goal status: Ongoing  6. Pt. Will reach up to comb/brush his hair  with minA.  Baseline: 07/20/22: reaches 75% of head, assist for far R side of head, fatigues quickly. 05/25/2022: Pt. Is able to reach up with the left hand to the left side, top, and back of his head. 03/23/2022: Pt. is now able to more consistently initiate reaching up to his head with his left hand in preparation for haircare12/18/2023: Pt. is now able to  more consistently initiate reaching up to his head with his left hand in preparation for haircare. Eval: Pt. is able to initiate reaching up for hair care with a long handled brush, however is unable to sustain UE's in elevation to perform the task.     Goal status: Ongoing  7. Pt. Will independently navigate the w/c through his environment with minA with visual scanning, and hand placement on the controls.  Baseline: 07/20/22: Pt. Requires minA to setup hand on controls and MIN cues to navigate the w/c in wide spaces, requires MIN - MOD cues to navigate the w/c through more narrow doorways, and tighter turns - varies based on good vs bad vision days. 05/25/2022: Pt. Requires minA  and  cues to navigate the w/c in wide spaces, and requires Mod cues to navigate the w/c  through more narrow doorways, and tighter turns. Pt. Requires max cues for scanning through the environment, and moderate cues for hand placement on the controls.     Goal status:Ongoing       ASSESSMENT:  CLINICAL IMPRESSION:  Pt. required no assist to hold water bottle in place when tilting his head back to drink from the bottle. Goals reviewed and measurements obtained. FOTO improved to 55. Pt achieves grasping 1/4" 9 hole pegs to remove all 9 from holes and place in bowl, min cues/assist to locate pegs. Improved B strength on MMT. Pt. continues to work on improving BUE functioning, w/c navigation, and visual compensatory strategies to improve, and maximize independence with ADLs, and IADLs.     PERFORMANCE DEFICITS in functional skills including ADLs, IADLs, coordination, dexterity, proprioception, ROM, strength, pain, FMC, GMC, decreased knowledge of use of DME, and UE functional use, cognitive skills including safety awareness, and psychosocial skills including coping strategies, environmental adaptation, habits, and routines and behaviors.   IMPAIRMENTS are limiting patient from ADLs, IADLs, leisure, and social participation.   COMORBIDITIES may have co-morbidities  that affects occupational performance. Patient will benefit from skilled OT to address above impairments and improve overall function.  MODIFICATION OR ASSISTANCE TO COMPLETE EVALUATION: Maximum or significant modification of tasks or assist is necessary to complete an evaluation.  OT OCCUPATIONAL PROFILE AND HISTORY: Comprehensive assessment: Review of records and extensive additional review of physical, cognitive, psychosocial history related to current functional performance.  CLINICAL DECISION MAKING: High - multiple treatment options, significant modification of task necessary  REHAB POTENTIAL: Fair    EVALUATION COMPLEXITY: High    PLAN: OT FREQUENCY: 2 x's a week  OT DURATION:12 weeks  PLANNED  INTERVENTIONS: self care/ADL training, therapeutic exercise, therapeutic activity, neuromuscular re-education, manual therapy, passive range of motion, patient/family education, and cognitive remediation/compensation  RECOMMENDED OTHER SERVICES: PT  CONSULTED AND AGREED WITH PLAN OF CARE: Patient and family member/caregiver  PLAN FOR NEXT SESSION: Initiate treatment   Kathie Dike, M.S. OTR/L  07/20/22, 5:37 PM  ascom [phone]  07/20/2022, 5:37 PM

## 2022-07-21 DIAGNOSIS — I6389 Other cerebral infarction: Secondary | ICD-10-CM | POA: Diagnosis not present

## 2022-07-21 DIAGNOSIS — L8994 Pressure ulcer of unspecified site, stage 4: Secondary | ICD-10-CM | POA: Diagnosis not present

## 2022-07-21 DIAGNOSIS — R32 Unspecified urinary incontinence: Secondary | ICD-10-CM | POA: Diagnosis not present

## 2022-07-25 ENCOUNTER — Ambulatory Visit: Payer: BC Managed Care – PPO

## 2022-07-25 ENCOUNTER — Ambulatory Visit: Payer: BC Managed Care – PPO | Admitting: Occupational Therapy

## 2022-07-25 DIAGNOSIS — R278 Other lack of coordination: Secondary | ICD-10-CM

## 2022-07-25 DIAGNOSIS — M6281 Muscle weakness (generalized): Secondary | ICD-10-CM

## 2022-07-25 DIAGNOSIS — H543 Unqualified visual loss, both eyes: Secondary | ICD-10-CM | POA: Diagnosis not present

## 2022-07-25 DIAGNOSIS — R269 Unspecified abnormalities of gait and mobility: Secondary | ICD-10-CM

## 2022-07-25 DIAGNOSIS — I6930 Unspecified sequelae of cerebral infarction: Secondary | ICD-10-CM | POA: Diagnosis not present

## 2022-07-25 DIAGNOSIS — R262 Difficulty in walking, not elsewhere classified: Secondary | ICD-10-CM | POA: Diagnosis not present

## 2022-07-25 DIAGNOSIS — R2681 Unsteadiness on feet: Secondary | ICD-10-CM

## 2022-07-25 NOTE — Therapy (Signed)
OUTPATIENT PHYSICAL THERAPY TREATMENT NOTE  Patient Name: Paul Hall MRN: [id number] DOB:08-25-1995, 27 y.o., male Today's Date: 07/25/2022  PCP:  Cindi Carbon PROVIDER:  Lenise Herald, PA-C   PT End of Session - 07/25/22 1608     Visit Number 99    Number of Visits 114    Date for PT Re-Evaluation 08/29/22    Authorization Type [employer] and [employer]- Need auth past 12th visit    Authorization Time Period 01/04/21-03/29/21; Recert 03/24/2021-06/16/2021; Recert 09/15/2021- 12/08/2021; Recert 12/13/2021-03/07/2022    Progress Note Due on Visit 100    PT Start Time 1609    PT Stop Time 1646    PT Time Calculation (min) 37 min    Equipment Utilized During Treatment Gait belt   Hoyer lift   Activity Tolerance Patient tolerated treatment well   queasiness/nausea   Behavior During Therapy WFL for tasks assessed/performed                            Past Medical History:  Diagnosis Date   Diabetes mellitus    Hypertension    Stroke    Past Surgical History:  Procedure Laterality Date   IVC FILTER PLACEMENT ([facility] HX)     LEG SURGERY     PEG TUBE PLACEMENT     TRACHEOSTOMY     Patient Active Problem List   Diagnosis Date Noted   Sepsis due to vancomycin resistant Enterococcus species 06/06/2019   SIRS (systemic inflammatory response syndrome) 06/05/2019   Acute lower UTI 06/05/2019   VRE (vancomycin-resistant Enterococci) infection 06/05/2019   Anemia 06/05/2019   Skin ulcer of sacrum with necrosis of muscle    Urinary retention    Type 2 diabetes mellitus without complication, with long-term current use of insulin    Tachycardia    Lower extremity edema    Acute metabolic encephalopathy    Obstructive sleep apnea    Morbid obesity with BMI of 60.0-69.9, adult    Goals of care, counseling/discussion    Palliative care encounter    Sepsis 04/27/2019   H/O insulin dependent diabetes mellitus 04/27/2019   History of CVA with residual deficit  04/27/2019   Seizure disorder 04/27/2019   Decubitus ulcer of sacral region, stage 4 04/27/2019    REFERRING DIAG: Cerebral infarction, unspecified   THERAPY DIAG:  Muscle weakness (generalized)  Other lack of coordination  History of CVA with residual deficit  Difficulty in walking, not elsewhere classified  Unsteadiness on feet  Abnormality of gait and mobility  Rationale for Evaluation and Treatment Rehabilitation  PERTINENT HISTORY: Paul Hall is a 25yoM who presents with severe weakness, quadriparesis, altered sensorium, and visual impairment s/p critical illness and prolonged hospitalization. Pt hospitalized in October 2020 with ARDS 2/2 COVID19 infection. Pt sustained a complex and lengthy hospitalization which included tracheostomy, prolonged sedation, ECMO. In this period pt sustained CVA and SDH. Pt has now been liberated from tracheostomy and G-tube. Pt has since been hospitalized for wound infection and UTI. Pt lives with parents at home, has hospital bed and left chair, hoyer lift transfers, and power WC for mobility needs. Pt needs heavy physical assistance with ADL 2/2 BUE contractures and motor dysfunction   PRECAUTIONS: Fall  SUBJECTIVE: Patient reports feeling very tight today and requesting more stretching vs. Any standing today.      Patient reports PAIN:   Are you having pain? No   TODAY'S TREATMENT:  -   Therapeutic Exercises:  All exercises performed with   PROM to B LE-  -Knee toward chest (hip march)- Hold 30 sec x 3 each LE  - Hip ER/IR- Hold 30 sec x 3 each LE  - Near figure 4- hold 30 sec x 3 each LE  - Seated quad stretch- hold 30 sec x 3 each LE  - Seated hamstring/gastroc stretch- hold 30 sec x 4 each LE  - Seated ankle/achilles stretch- hold 20 sec x 3 each LE  Pt educated throughout session about proper posture and technique with exercises. Improved exercise technique, movement at target joints, use of target muscles after min to mod  verbal, visual, tactile cues.       PATIENT EDUCATION: Education details: standing posture and exercise technique Person educated: Patient Education method: Explanation, Demonstration, Tactile cues, and Verbal cues Education comprehension: verbalized understanding, returned demonstration, verbal cues required, tactile cues required, and needs further education   HOME EXERCISE PROGRAM:  Access Code: [email or web address] URL: [email or web address] Date: 03/23/2022 Prepared by: Paul Hall  Exercises - Supine Bridge  - 3 x weekly - 3 sets - 10 reps - 2 hold - Supine Gluteal Sets  - 3 x weekly - 3 sets - 10 reps - 5 sec hold - Supine Quad Set  - 1 x daily - 3 x weekly - 3 sets - 10 reps - 5 hold - Seated Long Arc Quad  - 1 x daily - 7 x weekly - 3 sets - 10 reps - Seated Hip Adduction Squeeze with Ball  - 1 x daily - 3 x weekly - 3 sets - 10 reps - 5 hold - Seated Hip Abduction  - 1 x daily - 3 x weekly - 3 sets - 10 reps - 2 hold    PT Short Term Goals -       PT SHORT TERM GOAL #1   Title Pt will be independent with HEP in order to improve strength and balance in order to decrease fall risk and improve function at home and work.    Baseline 01/04/2021= No formal HEP in place; 12/12 no HEP in place; 05/10/2021-Patient and his father were able to report compliance with curent HEP consisting of mostly seated/reclined LE strengthening. Both verbalize no questions at this time.    Time 6    Period Weeks    Status Achieved    Target Date 02/15/21              PT Long Term Goals - Date for completion: 06/01/2022         PT LONG TERM GOAL #1   Title Patient will increase BLE gross strength by 1/2 muscle grade to improve functional strength for improved independence with potential gait, increased standing tolerance and increased ADL ability.    Baseline 01/04/2021- Patient presents with 1/5 to 3-/5 B LE strength with MMT; 12/12: goal partially met for Left knee/hip;  05/10/2021= 2-/5 bilateal Hip flex; 3+/5 bilateral Knee ext; 06/21/2021= Patient presents with 2-/5 bilateral Hip flex; 3+/5 bilateral knee ext/flex; 2-/5 left ankle DF; 0/5 right ankle- and able to increase reps and resistance with LE's. 09/15/2021- Patient technically presents with 2-/5 B hip flex/abd/add - but he is able to raise his hip up to approx 100 deg which has improved. 3+/5 Bilateral knee ext, 2-/5 left ankle and 0/5 right ankle.  12/08/2021= Patient able to lift left knee at 110 deg of hip flex; presents with 3+/5 knee ext, 2-/5 left ankle DF and 0/5 right ankle  DF, 2-/5 bilateral Hip abd in seated position.   12/6: R: knee 3+/5 ext, 2/5 flexion, left knee 3+/5 extension, 3+/5 flexion, R hip: 2+/5 hip add, 2+/5 hip ABD L hip: 4-/5 hip ABD, 3+/5 hip ADD, 3+/5 hip flexion; 06/06/2022= Patient now presents with 2-/5 right ankle DF/PF   Time 12    Period Weeks    Status MET   Target Date 03/07/2022     PT LONG TERM GOAL #2   Title Patient will tolerate sitting unsupported demonstrating erect sitting posture for 15 minutes with CGA to demonstrate improved back extensor strength and improved sitting tolerance.    Baseline 01/04/2021- Patient confied to sitting in lift chair or electric power chair with back support and unable to sit upright without physical assistance; 12/12: tolerates <1 minutes upright unsupported sitting. 05/10/2021=static sit with forward trunk lean  in his power wheelchair without back support x approx 3 min. 06/21/2021=Unable to assess today due to patient with acute back pain but on previous visit able to sit x 8 min without back support. 09/15/2021- on last visit- 09/13/2021- patient was able to sit unsupported x 8 min at edge of mat. 10/13/2023 - Patient was able to sit at edge of mat with varying level of assist today from SBA to min A for a total of 20 min. 12/13/2021= Patient demonstrated unsupported sitting at edge of mat for approx 20 min   Time 12    Period Weeks    Status GOAL  MET   Target Date 12/08/21      PT LONG TERM GOAL #3   Title Patient will demonstrate ability to perform static standing in // bars > 2 min with Max Assist  without loss of balance and fair posture for improved overall strength for pre-gait and transfer activities.    Baseline 01/04/2021= Patient current uanble to stand- Dependent on hoyer or sit to stand lift for transfers. 05/10/2021=Not appropriate yet- Currently still dependent with all transfers using hoyer. 06/21/2021= Patient continuing now to focus on LE strengthening to prepare for standing-unable to try today due to acute low back pain-  planning on attempting in new cert period. 09/15/2021- Patient has attempted standing 2x in past two week- max Assist of 2 people - only once was he successful to clearing his bottom from chair - Will continue to be a focus during the new certification. 12/13/2021= Patient has been limited secondary to increased overall low back pain during this certification and will require more time to focus on this goal.  12/6: not assessed this date, will assess at date when 2-3 PTs are present for assistance    Time 12    Period Weeks    Status GOAL not appropriate at this time - may attempt in future once Patient presents with improved overall LE strength.          PT LONG TERM GOAL #4   Title Pt will improve FOTO score by 10 points or more demonstrating improved perceived functional ability    Baseline FOTO 7 on 10/17; 03/15/21: FOTO 12; 05/10/2021 06/21/2021= 1; 09/15/2021= 9; 12/13/2021= Will issue next visit 12/6: 4; 06/06/2022= Will assess next visit   Time 12    Period Weeks    Status ONGOING   Target date 08/29/2022     PT LONG TERM GOAL #5   Title Patient will perform sit to stand transfer with appropriate AD and max assist of 2 people with 75% consistency to prepare for pregait activities.  Baseline 09/15/2021= Patient unable to stand well- unable to clear his bottom off chair with Max assist of 2 persons.  12/13/2021- Goal not appropriate to try yet but will keep and roll over to next cert as shift continues to focus on transfers/standing   Time 12    Period Weeks    Status Goal still not appropriate for now but will keep active for future   Target date    PT LONG TERM GOAL #6  Title Patient will tolerate sitting unsupported demonstrating erect sitting posture for 30 minutes with CGA to demonstrate improved back extensor strength and improved sitting tolerance.   Baseline 12/13/2021= Patient demonstrated unsupported sitting at edge of mat for approx 20 min;  12/29/2021- Patient performed approx 30 min of dynamic sitting activities today. 06/06/2022= Patient demonstrated ability to sit and perform static and dynamic UE/LE movement with only Supervision.   Time 12   Period Weeks   Status GOAL MET         7.  Patient will tolerate 5 minutes or more of standing in sit to stand lift in order to indicate improved lower extremity weightbearing tolerance for progression to standing in parallel bars. Baseline: 1 minute on most recent stand 02/21/22; 06/06/2022- Patient did attempt today after complaining of right LE pain last week. He attempted 3 stands using sit to stand lift. - all over 1 min- last one approx 48 sec- stopped due to fatigue. More erect standing in lift today - still poor gluteal strength but able to activate glutes and extend hips upon command but unable to hold > 5 sec.  Goal status: ONGOING- Progressing Target date: 08/29/2022  8. Patient will tolerate sitting unsupported demonstrating ability to perform dynamic UE/LE activities for 30 minutes  independently to demonstrate ability to sit at edge of bed to eat or perform some ADL's/exercise for optimal quality of life.  Baseline: 06/06/2022- Patient able to sit and perform some UE/LE exercises but requires CGA at times for safety- he is able to static sit for 30 min with supervision.   Goal Status: NEW  Target date: 08/29/2022        Plan     Clinical Impression Statement Patient reported very tight today so session focused on LE stretching and he did present with excessive tightness right LE vs. Left but did improve with increased time stretching. He reported feeling better after session. No advancement in standing due to patient complaint of tightness but will plan on resuming standing next visit as appropriate.  Pt will continue to benefit from skilled physical therapy intervention to address impairments, improve QOL, and attain therapy goals.    Personal Factors and Comorbidities Comorbidity 3+;Time since onset of injury/illness/exacerbation    Comorbidities CVA, diabetes, Seizures    Examination-Activity Limitations Bathing;Bed Mobility;Bend;Caring for Others;Carry;Dressing;Hygiene/Grooming;Lift;Locomotion Level;Reach Overhead;Self Feeding;Sit;Squat;Stairs;Stand;Transfers;Toileting    Examination-Participation Restrictions Cleaning;Community Activity;Driving;Laundry;Medication Management;Meal Prep;Occupation;Personal Finances;Shop;Yard Work;Volunteer    Stability/Clinical Decision Making Evolving/Moderate complexity    Rehab Potential Fair    PT Frequency 2x / week    PT Duration 12 weeks    PT Treatment/Interventions ADLs/Self Care Home Management;Cryotherapy;Electrical Stimulation;Moist Heat;Ultrasound;DME Instruction;Gait training;Stair training;Functional mobility training;Therapeutic exercise;Balance training;Patient/family education;Orthotic Fit/Training;Neuromuscular re-education;Wheelchair mobility training;Manual techniques;Passive range of motion;Dry needling;Energy conservation;Taping;Visual/perceptual remediation/compensation;Joint Manipulations    PT Next Visit Plan core strength- sitting at EOM, postural control, sit to stand if able; Continue with progressive LE Strengthening.    PT Home Exercise Plan No changes to HEP today    Consulted and Agree with Plan  of Care Patient;Family member/caregiver    Family  Member Consulted Mom             Louis Meckel, PT Physical Therapist- [facility]  [facility]  07/25/2022, 4:53 PM

## 2022-07-25 NOTE — Therapy (Signed)
Occupational Therapy Progress Note  Patient Name: Leticia Mcdiarmid MRN: [id number] DOB:1995/10/16, 27 y.o., male Today's Date: 07/25/2022  PCP: Dr. Sherwood Gambler REFERRING PROVIDER: Dr. Sherwood Gambler   OT End of Session - 07/25/22 2239     Visit Number 31    Number of Visits 36    Date for OT Re-Evaluation 08/17/22    Authorization Type Progress report period starting 10/06/2021    OT Start Time 1650    OT Stop Time 1740    OT Time Calculation (min) 50 min    Activity Tolerance Patient tolerated treatment well    Behavior During Therapy WFL for tasks assessed/performed                     Past Medical History:  Diagnosis Date   Diabetes mellitus    Hypertension    Stroke    Past Surgical History:  Procedure Laterality Date   IVC FILTER PLACEMENT ([facility] HX)     LEG SURGERY     PEG TUBE PLACEMENT     TRACHEOSTOMY     Patient Active Problem List   Diagnosis Date Noted   Sepsis due to vancomycin resistant Enterococcus species 06/06/2019   SIRS (systemic inflammatory response syndrome) 06/05/2019   Acute lower UTI 06/05/2019   VRE (vancomycin-resistant Enterococci) infection 06/05/2019   Anemia 06/05/2019   Skin ulcer of sacrum with necrosis of muscle    Urinary retention    Type 2 diabetes mellitus without complication, with long-term current use of insulin    Tachycardia    Lower extremity edema    Acute metabolic encephalopathy    Obstructive sleep apnea    Morbid obesity with BMI of 60.0-69.9, adult    Goals of care, counseling/discussion    Palliative care encounter    Sepsis 04/27/2019   H/O insulin dependent diabetes mellitus 04/27/2019   History of CVA with residual deficit 04/27/2019   Seizure disorder 04/27/2019   Decubitus ulcer of sacral region, stage 4 04/27/2019    ONSET DATE: 01/2019  REFERRING DIAG: CVA/COVID-19  THERAPY DIAG:  Muscle weakness (generalized)  Rationale for Evaluation and Treatment Rehabilitation  SUBJECTIVE:   SUBJECTIVE  STATEMENT:  Pt reports  having been tighter overall I this past weekend  Pt accompanied by: self and family member  PERTINENT HISTORY:  Pt. is a 27 y.o. male who was diagnosed with COVID-19, and CVA with resultant quadriplegia. Pt. Was then hospitalized with VRE UTI. PMHx includes: urinary retention, seizure disorder, obstructive sleep disorder, DM Type II, Morbid obesity.   PRECAUTIONS: None  WEIGHT BEARING RESTRICTIONS No  PAIN:  Are you having pain? No-soreness in right forearm from recent Botox.  FALLS: Has patient fallen in last 6 months? No  LIVING ENVIRONMENT: Lives with: lives with their family Lives in: House/apartment Stairs: No Level Entry Has following equipment at home: Wheelchair (power) and hoyer lift, sit to stand lift  PLOF: Independent  PATIENT GOALS: To be able to engage in more daily care tasks.  OBJECTIVE:   HAND DOMINANCE: Right  ADLs:  Transfers/ambulation related to ADLs: Eating: Pt. reports being able to hold standard utensils, and is starting to engage more in self-feeding tasks, hand to mouth patterns. Pt. Reports that he does as much as he can with the task, and family assists  with the remainder of the task. Grooming: Pt. Is able to initiate holding an electric toothbrush, and brush his teeth. Family assists LB Dressing: Total Assist UE dressing: Pt. is now able to  reach up to actively assist with grasping , and pulling his gown down. Toileting:  Total Assist Bathing: MaxA UB, Total assist LB Tub Shower transfers: N/A Equipment: See above    IADLs: Shopping: Relies on family to assist Light housekeeping: Total Assist Meal Prep: Total Assist Community mobility:   Medication management:  Total Assist  Financial management: N/A Handwriting: Not legible: Pt. Is able to hold a pen with the left hand, and initiate marking the page. Pt.'s eye glasses were not available  MOBILITY STATUS:  Power w/c  POSTURE COMMENTS:  Pt. Requires position  changes in his power w/c  ACTIVITY TOLERANCE: Activity tolerance:  Fair  FUNCTIONAL OUTCOME MEASURES: FOTO: 40  TR score: 45  05/25/2022:   FOTO: 50 TR score: 45  07/20/22: FOTO 55  UPPER EXTREMITY ROM     Active ROM Right eval Right 03/21/2022 Right 05/25/2022 R  07/20/22 Right  07/25/22  Left eval Left  03/21/2022 Left 05/25/2022 L  07/20/22 Left 07/25/2022  Shoulder flexion 106 scaption 105  Scaption 62 flexion 110 scaption 100   119 123 125 130   Shoulder abduction 114 90 97 100   110 115 115 115   Shoulder adduction             Shoulder extension             Shoulder internal rotation             Shoulder external rotation             Elbow flexion 120(130) 145 145 140   135(135) 145 145    Elbow extension -45(-35) -22(-35) -28(-22)  -32(-21)  -27(-18) -21(-20) -20(-16)  -25(-10)  Wrist flexion             Wrist extension -30(10) -25(10) -10(30)  -10(20)  10(50) 19(50) 28(50)  30(50)  Wrist ulnar deviation             Wrist radial deviation             Wrist pronation             Wrist supination   Limited by flexor tone      Limited by flexor tone    (Blank rows = not tested)      UPPER EXTREMITY MMT:     Right eval Right 03/21/2022 Right  05/25/2022 R 07/20/22  Left eval Left 03/21/2022 Left  05/25/2022 L 07/20/22  Shoulder flexion 3-/5 scaption 3-/5 3-/5 3+/5  3-/5 3/5 3+/5 4+/5  Shoulder abduction 3-/5 3-/5 3-/5 4/5  3-/5 3-/5 4-/5 4+/5  Shoulder adduction           Shoulder extension           Shoulder internal rotation           Shoulder external rotation           Elbow flexion 3/5 3/5 3+/5 4/5  3/5 3+/5 4-/5 4+/5  Elbow extension 2-/5 2/5 3-/5 4+/5  2-/5 2/5  4+/5  Wrist flexion           Wrist extension 2-/5 2-/5 2/5 2/5  2/5 2+/5 3-/5   Wrist ulnar deviation           Wrist radial deviation           Wrist pronation           Wrist supination           (Blank rows = not tested)  HAND FUNCTION:  Grip strength: Right: 0 lbs; Left: 0 lbs  and Lateral pinch: Right: 5 lbs, Left: 2 lbs  03/21/2022: Lateral pinch: Right: 3.5 lbs, Left: 2 lbs  07/20/22: Grip strength: Right: 3 lbs; Left: 4 lbs and Lateral pinch: Right: 5 lbs, Left: 3 lbs  Bilateral digit PIP/DIP flexion contractures with MP hyperextension with attempts for AROM. Pt. is able to tolerate AROM to the bilateral digits at the initial evaluation however, has a history of pain in the digits.  COORDINATION:  Eval: Pt. is unable to grasp 9-hole test pegs. Pt. is able to initiate grasping larger pegs, and is able to hold a pen in the left hand.  07/20/22: 2 min 36 seconds to remove 9 pegs from 9 hole peg test - cues to locate pegs 2/2 low vision. Pt. is able to initiate grasping larger pegs on R hand and is able to hold a pen in the left hand.  SENSATION: Light touch: Impaired   EDEMA:  N/A  MUSCLE TONE: BUE flexor Spasticity  COGNITION Overall cognitive status: Continue to assess in functional context  VISION:   Subjective report: Pt. was not wearing glasses at the time of the initial eval.  Baseline vision: Vision is very limited. Wears glasses all the time Visual history: History of impaired vision following CVA. Pt. Has received treatment through the [facility] low vision rehabilitation program.   VISION ASSESSMENT: Impaired To be further assessed in functional context  PERCEPTION: Impaired   PRAXIS: Impaired: motor planning  OBSERVATIONS:  Pt reports being on Tramadol   TODAY'S TREATMENT:     Pt. tolerated slow gentle stretching PROM following A/AAROM in all joint ranges of the BUEs following moist heat modality 2/2 tightness/stiffness.   Self care: MOD A to buckle seat belt on power w/c in sitting, assist for setup and tactile cues 2/2 visual deficits. Pt grasped plastic water bottle and completed self-drinking with setup assist only. SETUP to place chewing gum in mouth.   PATIENT EDUCATION: Education details: There. Halford Decamp Person educated:  Patient and Parent Education method: Explanation, Demonstration, Tactile cues, and Verbal cues Education comprehension: verbalized understanding, returned demonstration, verbal cues required, and needs further education   HOME EXERCISE PROGRAM:   Continue ongoing assessment, and continue to provide as needed.     GOALS: Goals reviewed with patient? Yes  SHORT TERM GOALS: Target date:05/03/2022     To assess splint fit, and make appropriate adjustments to promote good skin integrity through the palmar surface of the bilateral hands.  Baseline: 05/25/22: Goal currently met, however ongoing as needs to assess splint fit arise. 03/23/2022: Pt. is wearing splints a couple of hours at night bilateral resting hand splints. 03/21/2022: Pt. is wearing splints a couple of hours at night bilateral resting hand splints. Goal status: Continue ongoing assessment as requested, and as needed   LONG TERM GOALS: Target date: 06/13/2022     FOTO score will Improve by 2 points for Pt. perceived improvement with the assessment specific ADL/IADL tasks.  Baseline: 05/25/2022: FOTO score: 50,  TR score: 45 Eval: FOTO score: 40,  TR score: 45 Goal status: Achieved   2.   Pt. will independently perform oral care for 100% of the task after complete set-up. Baseline: 07/20/22: completes 90% of task, limited by shoulder flexion. 05/25/2022:  Pt. Is able to initiate and perform oral care for approximately 90% of the task. Complete set-up required. Assi needed only for the very back teeth. 03/23/2022: Pt. Is able to  initiate and perform oral care for approximately 75% of the task. Pt. Requires assist at proximally at the elbow for through oral care. 03/21/2022: Pt. Is able to initiate and perform oral care for approximately 75% of the task. Pt. Requires assist at proximally at the elbow for through oral care. Eval: Pt. is able to initiate using an electric toothbrush. Pt. requires assist for set-up, and assist for  thoroughness, and as he Pt. fatigues. Goal status: Ongoing  3.  Pt. Will be independent with self-feeding for 100% of the meal after complete set-up Baseline: 07/20/22: self-feeds cereal using spoon 90% of task. 05/25/2022: Pt. Is able to use a spoon to scoop cereal when feeding himself cereal 85% of the time. Pt. Is able to feed himself snack/finger foods 100% of the time. Pt. Continues to work on consistency of  stabilizing a cup/mug when drinking. Pt. Is able to grasp a water bottle with assist initially, with assist tapering off as he drinks.03/23/2022: Pt. Is able to perform scooping cereal for 75% of the time. Pt. required assist, and support at the left elbow, and Pt. Presents with limited forearm supination when using the spoon, and bringing it towards his mouth. Pt. Is able to use a fork to spear items, and perform the hand to mouth pattern.  03/21/2022: Pt. Is able to perform scooping cereal for 75% of the time. Pt. required assist, and support at the left elbow, and Pt. presents with limited forearm supination when using the spoon, and bringing it towards his mouth. Pt. Is able to use a fork to spear items, and perform the hand to mouth pattern.  Eval: Pt. is able to hold standard standard utensils. Pt. Performs as much of the task as he, can and has assistance for the remainder. Goal status: Ongoing  4.  Pt. Will improve grasp patterns and consistently grasp 1/2" objects for ADL, and IADL tasks.  Baseline: 07/20/22: grasps 1/4" pegs with min a + visual cues, consistently grasping 1/2" objects with visual cues. 05/25/2022: Pt. Is working on improving consistency of grasping 1/2" objects with visual cues.  03/23/2022: Pt. Is able to grasp 1" objects consistently,and continues to work on the hand patterns needed to grasp 1/2" objects.03/21/2022: Pt. Is able to grasp 1" objects consistently,and continues to work on the hand patterns needed to grasp 1/2" objects. Eval: Pt. is able to grasp 1" objects  intermittently using a lateral grasp pattern. Goal status: Ongoing  5.  Pt. will independently write his name legibly with letter sizes under 2". Baseline: 07/20/22: stabilizing assist to write name with 75% legibility with 2" letters. 05/25/2022: Pt. to continue to work towards formulating grasp patterns in preparation for grasping a large width pen. Pt. Requires visual cues. 03/23/2022: Pt. Is able to write his name with modA, however has difficulty with formulating letter sizes less than 2" in size with 50% legibility for the 3 letters of his name.03/21/2022: Pt. Is able to write his name with modA, however has difficulty with formulating letter sizes less than 2" in size with 50% legibility for the 3 letters of his name. Eval: Pt is able to hold a thin marker with his left hand, and formulate a line, and initiate a circular pattern (Pt. without glasses today) Goal status: Ongoing  6. Pt. Will reach up to comb/brush his hair  with minA.  Baseline: 07/20/22: reaches 75% of head, assist for far R side of head, fatigues quickly. 05/25/2022: Pt. Is able to reach up with the left  hand to the left side, top, and back of his head. 03/23/2022: Pt. is now able to more consistently initiate reaching up to his head with his left hand in preparation for haircare12/18/2023: Pt. is now able to more consistently initiate reaching up to his head with his left hand in preparation for haircare. Eval: Pt. is able to initiate reaching up for hair care with a long handled brush, however is unable to sustain UE's in elevation to perform the task.     Goal status: Ongoing  7. Pt. Will independently navigate the w/c through his environment with minA with visual scanning, and hand placement on the controls.  Baseline: 07/20/22: Pt. Requires minA to setup hand on controls and MIN cues to navigate the w/c in wide spaces, requires MIN - MOD cues to navigate the w/c through more narrow doorways, and tighter turns - varies based on  good vs bad vision days. 05/25/2022: Pt. Requires minA  and  cues to navigate the w/c in wide spaces, and requires Mod cues to navigate the w/c through more narrow doorways, and tighter turns. Pt. Requires max cues for scanning through the environment, and moderate cues for hand placement on the controls.     Goal status:Ongoing       ASSESSMENT:  CLINICAL IMPRESSION:  Pt. required set-up, and hand over hand assist for drinking from a water bottle. Pt. presents with increased stiffness, and tightness in the bilateral UEs. Pt. Tolerated BUE ROM in all joint ranges with emphasis placed on slow gentle stretching following moist heat modality. Pt. continues to work on improving BUE functioning, w/c navigation, and visual compensatory strategies to improve, and maximize independence with ADLs, and IADLs.     PERFORMANCE DEFICITS in functional skills including ADLs, IADLs, coordination, dexterity, proprioception, ROM, strength, pain, FMC, GMC, decreased knowledge of use of DME, and UE functional use, cognitive skills including safety awareness, and psychosocial skills including coping strategies, environmental adaptation, habits, and routines and behaviors.   IMPAIRMENTS are limiting patient from ADLs, IADLs, leisure, and social participation.   COMORBIDITIES may have co-morbidities  that affects occupational performance. Patient will benefit from skilled OT to address above impairments and improve overall function.  MODIFICATION OR ASSISTANCE TO COMPLETE EVALUATION: Maximum or significant modification of tasks or assist is necessary to complete an evaluation.  OT OCCUPATIONAL PROFILE AND HISTORY: Comprehensive assessment: Review of records and extensive additional review of physical, cognitive, psychosocial history related to current functional performance.  CLINICAL DECISION MAKING: High - multiple treatment options, significant modification of task necessary  REHAB POTENTIAL: Fair     EVALUATION COMPLEXITY: High    PLAN: OT FREQUENCY: 2 x's a week  OT DURATION:12 weeks  PLANNED INTERVENTIONS: self care/ADL training, therapeutic exercise, therapeutic activity, neuromuscular re-education, manual therapy, passive range of motion, patient/family education, and cognitive remediation/compensation  RECOMMENDED OTHER SERVICES: PT  CONSULTED AND AGREED WITH PLAN OF CARE: Patient and family member/caregiver  PLAN FOR NEXT SESSION: Initiate treatment   Olegario Messier, MS, OTR/L

## 2022-07-27 ENCOUNTER — Ambulatory Visit: Payer: BC Managed Care – PPO | Admitting: Occupational Therapy

## 2022-07-27 ENCOUNTER — Ambulatory Visit: Payer: BC Managed Care – PPO

## 2022-07-27 ENCOUNTER — Encounter: Payer: Self-pay | Admitting: Occupational Therapy

## 2022-07-27 DIAGNOSIS — I6930 Unspecified sequelae of cerebral infarction: Secondary | ICD-10-CM

## 2022-07-27 DIAGNOSIS — H543 Unqualified visual loss, both eyes: Secondary | ICD-10-CM | POA: Diagnosis not present

## 2022-07-27 DIAGNOSIS — M6281 Muscle weakness (generalized): Secondary | ICD-10-CM | POA: Diagnosis not present

## 2022-07-27 DIAGNOSIS — R278 Other lack of coordination: Secondary | ICD-10-CM | POA: Diagnosis not present

## 2022-07-27 DIAGNOSIS — R262 Difficulty in walking, not elsewhere classified: Secondary | ICD-10-CM

## 2022-07-27 DIAGNOSIS — R2681 Unsteadiness on feet: Secondary | ICD-10-CM | POA: Diagnosis not present

## 2022-07-27 DIAGNOSIS — R269 Unspecified abnormalities of gait and mobility: Secondary | ICD-10-CM | POA: Diagnosis not present

## 2022-07-27 NOTE — Therapy (Signed)
Occupational Therapy Progress Note  Patient Name: Paul Hall MRN: [id number] DOB:[birth year], 28 y.o., male Today's Date: 07/27/2022  PCP: Dr. Sherwood Gambler REFERRING PROVIDER: Dr. Sherwood Gambler   OT End of Session - 07/27/22 1651     Visit Number 32    Number of Visits 36    Date for OT Re-Evaluation 08/17/22    Authorization Type Progress report period starting 10/06/2021    OT Start Time 1650    OT Stop Time 1730    OT Time Calculation (min) 40 min    Activity Tolerance Patient tolerated treatment well    Behavior During Therapy WFL for tasks assessed/performed                     Past Medical History:  Diagnosis Date   Diabetes mellitus    Hypertension    Stroke    Past Surgical History:  Procedure Laterality Date   IVC FILTER PLACEMENT ([facility] HX)     LEG SURGERY     PEG TUBE PLACEMENT     TRACHEOSTOMY     Patient Active Problem List   Diagnosis Date Noted   Sepsis due to vancomycin resistant Enterococcus species 06/06/2019   SIRS (systemic inflammatory response syndrome) 06/05/2019   Acute lower UTI 06/05/2019   VRE (vancomycin-resistant Enterococci) infection 06/05/2019   Anemia 06/05/2019   Skin ulcer of sacrum with necrosis of muscle    Urinary retention    Type 2 diabetes mellitus without complication, with long-term current use of insulin    Tachycardia    Lower extremity edema    Acute metabolic encephalopathy    Obstructive sleep apnea    Morbid obesity with BMI of 60.0-69.9, adult    Goals of care, counseling/discussion    Palliative care encounter    Sepsis 04/27/2019   H/O insulin dependent diabetes mellitus 04/27/2019   History of CVA with residual deficit 04/27/2019   Seizure disorder 04/27/2019   Decubitus ulcer of sacral region, stage 4 04/27/2019    ONSET DATE: 01/2019  REFERRING DIAG: CVA/COVID-19  THERAPY DIAG:  Muscle weakness (generalized)  Other lack of coordination  History of CVA with residual deficit  Low vision, both  eyes  Rationale for Evaluation and Treatment Rehabilitation  SUBJECTIVE:   SUBJECTIVE STATEMENT:  Pt reports  having been tighter overall  this past weekend  Pt accompanied by: self and family member  PERTINENT HISTORY:  Pt. is a 27 y.o. male who was diagnosed with COVID-19, and CVA with resultant quadriplegia. Pt. Was then hospitalized with VRE UTI. PMHx includes: urinary retention, seizure disorder, obstructive sleep disorder, DM Type II, Morbid obesity.   PRECAUTIONS: None  WEIGHT BEARING RESTRICTIONS No  PAIN:  Are you having pain? No-soreness in right forearm from recent Botox.  FALLS: Has patient fallen in last 6 months? No  LIVING ENVIRONMENT: Lives with: lives with their family Lives in: House/apartment Stairs: No Level Entry Has following equipment at home: Wheelchair (power) and hoyer lift, sit to stand lift  PLOF: Independent  PATIENT GOALS: To be able to engage in more daily care tasks.  OBJECTIVE:   HAND DOMINANCE: Right  ADLs:  Transfers/ambulation related to ADLs: Eating: Pt. reports being able to hold standard utensils, and is starting to engage more in self-feeding tasks, hand to mouth patterns. Pt. Reports that he does as much as he can with the task, and family assists  with the remainder of the task. Grooming: Pt. Is able to initiate holding an electric toothbrush,  and brush his teeth. Family assists LB Dressing: Total Assist UE dressing: Pt. is now able to reach up to actively assist with grasping , and pulling his gown down. Toileting:  Total Assist Bathing: MaxA UB, Total assist LB Tub Shower transfers: N/A Equipment: See above    IADLs: Shopping: Relies on family to assist Light housekeeping: Total Assist Meal Prep: Total Assist Community mobility:   Medication management:  Total Assist  Financial management: N/A Handwriting: Not legible: Pt. Is able to hold a pen with the left hand, and initiate marking the page. Pt.'s eye glasses  were not available  MOBILITY STATUS:  Power w/c  POSTURE COMMENTS:  Pt. Requires position changes in his power w/c  ACTIVITY TOLERANCE: Activity tolerance:  Fair  FUNCTIONAL OUTCOME MEASURES: FOTO: 40  TR score: 45  05/25/2022:   FOTO: 50 TR score: 45  07/20/22: FOTO 55  UPPER EXTREMITY ROM     Active ROM Right eval Right 03/21/2022 Right 05/25/2022 R  07/20/22 Right  07/25/22  Left eval Left  03/21/2022 Left 05/25/2022 L  07/20/22 Left 07/25/2022  Shoulder flexion 106 scaption 105  Scaption 62 flexion 110 scaption 100   119 123 125 130   Shoulder abduction 114 90 97 100   110 115 115 115   Shoulder adduction             Shoulder extension             Shoulder internal rotation             Shoulder external rotation             Elbow flexion 120(130) 145 145 140   135(135) 145 145    Elbow extension -45(-35) -22(-35) -28(-22)  -32(-21)  -27(-18) -21(-20) -20(-16)  -25(-10)  Wrist flexion             Wrist extension -30(10) -25(10) -10(30)  -10(20)  10(50) 19(50) 28(50)  30(50)  Wrist ulnar deviation             Wrist radial deviation             Wrist pronation             Wrist supination   Limited by flexor tone      Limited by flexor tone    (Blank rows = not tested)      UPPER EXTREMITY MMT:     Right eval Right 03/21/2022 Right  05/25/2022 R 07/20/22  Left eval Left 03/21/2022 Left  05/25/2022 L 07/20/22  Shoulder flexion 3-/5 scaption 3-/5 3-/5 3+/5  3-/5 3/5 3+/5 4+/5  Shoulder abduction 3-/5 3-/5 3-/5 4/5  3-/5 3-/5 4-/5 4+/5  Shoulder adduction           Shoulder extension           Shoulder internal rotation           Shoulder external rotation           Elbow flexion 3/5 3/5 3+/5 4/5  3/5 3+/5 4-/5 4+/5  Elbow extension 2-/5 2/5 3-/5 4+/5  2-/5 2/5  4+/5  Wrist flexion           Wrist extension 2-/5 2-/5 2/5 2/5  2/5 2+/5 3-/5   Wrist ulnar deviation           Wrist radial deviation           Wrist pronation           Wrist supination            (  Blank rows = not tested)     HAND FUNCTION:  Grip strength: Right: 0 lbs; Left: 0 lbs and Lateral pinch: Right: 5 lbs, Left: 2 lbs  03/21/2022: Lateral pinch: Right: 3.5 lbs, Left: 2 lbs  07/20/22: Grip strength: Right: 3 lbs; Left: 4 lbs and Lateral pinch: Right: 5 lbs, Left: 3 lbs  Bilateral digit PIP/DIP flexion contractures with MP hyperextension with attempts for AROM. Pt. is able to tolerate AROM to the bilateral digits at the initial evaluation however, has a history of pain in the digits.  COORDINATION:  Eval: Pt. is unable to grasp 9-hole test pegs. Pt. is able to initiate grasping larger pegs, and is able to hold a pen in the left hand.  07/20/22: 2 min 36 seconds to remove 9 pegs from 9 hole peg test - cues to locate pegs 2/2 low vision. Pt. is able to initiate grasping larger pegs on R hand and is able to hold a pen in the left hand.  SENSATION: Light touch: Impaired   EDEMA:  N/A  MUSCLE TONE: BUE flexor Spasticity  COGNITION Overall cognitive status: Continue to assess in functional context  VISION:   Subjective report: Pt. was not wearing glasses at the time of the initial eval.  Baseline vision: Vision is very limited. Wears glasses all the time Visual history: History of impaired vision following CVA. Pt. Has received treatment through the [facility] low vision rehabilitation program.   VISION ASSESSMENT: Impaired To be further assessed in functional context  PERCEPTION: Impaired   PRAXIS: Impaired: motor planning  OBSERVATIONS:  Pt reports being on Tramadol   TODAY'S TREATMENT:     Therapeutic Exercise: Pt tolerated slow gentle stretching PROM following A/AAROM in all joint ranges of the BUEs following moist heat modality 2/2 tightness/stiffness. Pt completed seated BUE therex using boxing speed bag set at head height; tolerated 15 min with intermittent rest breaks and 2# wrist weight to L arm. Increased difficulty extending R arm at shoulder height  to hit target however with w/c placed at 45* angle to bag pt hit bag consistently alternating L and R hand.   Self care: MOD A to buckle seat belt on power w/c in sitting, assist for setup and clicking buckle into place. Pt grasped plastic water bottle and completed self-drinking with setup assist to grasp water bottle.  PATIENT EDUCATION: Education details: There. Halford Decamp Person educated: Patient and Parent Education method: Explanation, Demonstration, Tactile cues, and Verbal cues Education comprehension: verbalized understanding, returned demonstration, verbal cues required, and needs further education   HOME EXERCISE PROGRAM:   Continue ongoing assessment, and continue to provide as needed.     GOALS: Goals reviewed with patient? Yes  SHORT TERM GOALS: Target date:05/03/2022     To assess splint fit, and make appropriate adjustments to promote good skin integrity through the palmar surface of the bilateral hands.  Baseline: 05/25/22: Goal currently met, however ongoing as needs to assess splint fit arise. 03/23/2022: Pt. is wearing splints a couple of hours at night bilateral resting hand splints. 03/21/2022: Pt. is wearing splints a couple of hours at night bilateral resting hand splints. Goal status: Continue ongoing assessment as requested, and as needed   LONG TERM GOALS: Target date: 06/13/2022     FOTO score will Improve by 2 points for Pt. perceived improvement with the assessment specific ADL/IADL tasks.  Baseline: 05/25/2022: FOTO score: 50,  TR score: 45 Eval: FOTO score: 40,  TR score: 45 Goal status: Achieved  2.   Pt. will independently perform oral care for 100% of the task after complete set-up. Baseline: 07/20/22: completes 90% of task, limited by shoulder flexion. 05/25/2022:  Pt. Is able to initiate and perform oral care for approximately 90% of the task. Complete set-up required. Assi needed only for the very back teeth. 03/23/2022: Pt. Is able to initiate  and perform oral care for approximately 75% of the task. Pt. Requires assist at proximally at the elbow for through oral care. 03/21/2022: Pt. Is able to initiate and perform oral care for approximately 75% of the task. Pt. Requires assist at proximally at the elbow for through oral care. Eval: Pt. is able to initiate using an electric toothbrush. Pt. requires assist for set-up, and assist for thoroughness, and as he Pt. fatigues. Goal status: Ongoing  3.  Pt. Will be independent with self-feeding for 100% of the meal after complete set-up Baseline: 07/20/22: self-feeds cereal using spoon 90% of task. 05/25/2022: Pt. Is able to use a spoon to scoop cereal when feeding himself cereal 85% of the time. Pt. Is able to feed himself snack/finger foods 100% of the time. Pt. Continues to work on consistency of  stabilizing a cup/mug when drinking. Pt. Is able to grasp a water bottle with assist initially, with assist tapering off as he drinks.03/23/2022: Pt. Is able to perform scooping cereal for 75% of the time. Pt. required assist, and support at the left elbow, and Pt. Presents with limited forearm supination when using the spoon, and bringing it towards his mouth. Pt. Is able to use a fork to spear items, and perform the hand to mouth pattern.  03/21/2022: Pt. Is able to perform scooping cereal for 75% of the time. Pt. required assist, and support at the left elbow, and Pt. presents with limited forearm supination when using the spoon, and bringing it towards his mouth. Pt. Is able to use a fork to spear items, and perform the hand to mouth pattern.  Eval: Pt. is able to hold standard standard utensils. Pt. Performs as much of the task as he, can and has assistance for the remainder. Goal status: Ongoing  4.  Pt. Will improve grasp patterns and consistently grasp 1/2" objects for ADL, and IADL tasks.  Baseline: 07/20/22: grasps 1/4" pegs with min a + visual cues, consistently grasping 1/2" objects with visual cues.  05/25/2022: Pt. Is working on improving consistency of grasping 1/2" objects with visual cues.  03/23/2022: Pt. Is able to grasp 1" objects consistently,and continues to work on the hand patterns needed to grasp 1/2" objects.03/21/2022: Pt. Is able to grasp 1" objects consistently,and continues to work on the hand patterns needed to grasp 1/2" objects. Eval: Pt. is able to grasp 1" objects intermittently using a lateral grasp pattern. Goal status: Ongoing  5.  Pt. will independently write his name legibly with letter sizes under 2". Baseline: 07/20/22: stabilizing assist to write name with 75% legibility with 2" letters. 05/25/2022: Pt. to continue to work towards formulating grasp patterns in preparation for grasping a large width pen. Pt. Requires visual cues. 03/23/2022: Pt. Is able to write his name with modA, however has difficulty with formulating letter sizes less than 2" in size with 50% legibility for the 3 letters of his name.03/21/2022: Pt. Is able to write his name with modA, however has difficulty with formulating letter sizes less than 2" in size with 50% legibility for the 3 letters of his name. Eval: Pt is able to hold a thin  marker with his left hand, and formulate a line, and initiate a circular pattern (Pt. without glasses today) Goal status: Ongoing  6. Pt. Will reach up to comb/brush his hair  with minA.  Baseline: 07/20/22: reaches 75% of head, assist for far R side of head, fatigues quickly. 05/25/2022: Pt. Is able to reach up with the left hand to the left side, top, and back of his head. 03/23/2022: Pt. is now able to more consistently initiate reaching up to his head with his left hand in preparation for haircare12/18/2023: Pt. is now able to more consistently initiate reaching up to his head with his left hand in preparation for haircare. Eval: Pt. is able to initiate reaching up for hair care with a long handled brush, however is unable to sustain UE's in elevation to perform the task.      Goal status: Ongoing  7. Pt. Will independently navigate the w/c through his environment with minA with visual scanning, and hand placement on the controls.  Baseline: 07/20/22: Pt. Requires minA to setup hand on controls and MIN cues to navigate the w/c in wide spaces, requires MIN - MOD cues to navigate the w/c through more narrow doorways, and tighter turns - varies based on good vs bad vision days. 05/25/2022: Pt. Requires minA  and  cues to navigate the w/c in wide spaces, and requires Mod cues to navigate the w/c through more narrow doorways, and tighter turns. Pt. Requires max cues for scanning through the environment, and moderate cues for hand placement on the controls.     Goal status:Ongoing       ASSESSMENT:  CLINICAL IMPRESSION:  Pt. required set-up to drinking from a water bottle, assist to initiate grasp. Tolerated BUE ROM in all joint ranges with emphasis placed on slow gentle stretching following moist heat modality. Pt was highly motivated to complete 15 min speed boxing bag with 2# wrist weight on L arm and cues to fully extend R arm. With w/c placed at 45* angle to bag pt hit bag consistently alternating L and R hand. Pt. continues to work on improving BUE functioning, w/c navigation, and visual compensatory strategies to improve, and maximize independence with ADLs, and IADLs.     PERFORMANCE DEFICITS in functional skills including ADLs, IADLs, coordination, dexterity, proprioception, ROM, strength, pain, FMC, GMC, decreased knowledge of use of DME, and UE functional use, cognitive skills including safety awareness, and psychosocial skills including coping strategies, environmental adaptation, habits, and routines and behaviors.   IMPAIRMENTS are limiting patient from ADLs, IADLs, leisure, and social participation.   COMORBIDITIES may have co-morbidities  that affects occupational performance. Patient will benefit from skilled OT to address above impairments and improve  overall function.  MODIFICATION OR ASSISTANCE TO COMPLETE EVALUATION: Maximum or significant modification of tasks or assist is necessary to complete an evaluation.  OT OCCUPATIONAL PROFILE AND HISTORY: Comprehensive assessment: Review of records and extensive additional review of physical, cognitive, psychosocial history related to current functional performance.  CLINICAL DECISION MAKING: High - multiple treatment options, significant modification of task necessary  REHAB POTENTIAL: Fair    EVALUATION COMPLEXITY: High    PLAN: OT FREQUENCY: 2 x's a week  OT DURATION:12 weeks  PLANNED INTERVENTIONS: self care/ADL training, therapeutic exercise, therapeutic activity, neuromuscular re-education, manual therapy, passive range of motion, patient/family education, and cognitive remediation/compensation  RECOMMENDED OTHER SERVICES: PT  CONSULTED AND AGREED WITH PLAN OF CARE: Patient and family member/caregiver  PLAN FOR NEXT SESSION: Initiate treatment  Kathie Dike,  M.S. OTR/L  07/27/22, 5:42 PM  ascom [phone]

## 2022-07-28 NOTE — Therapy (Signed)
OUTPATIENT PHYSICAL THERAPY TREATMENT NOTE/Physical Therapy Progress Note   Dates of reporting period  06/06/2022   to   07/27/2022  Patient Name: Paul Hall MRN: [id number] DOB:Mar 04, 1996, 27 y.o., male Today's Date: 07/28/2022  PCP:  Cindi Carbon PROVIDER:  Lenise Herald, PA-C   PT End of Session - 07/27/22 [phone]     Visit Number 100    Number of Visits 114    Date for PT Re-Evaluation 08/29/22    Authorization Type [employer] and [employer]- Need auth past 12th visit    Authorization Time Period 01/04/21-03/29/21; Recert 03/24/2021-06/16/2021; Recert 09/15/2021- 12/08/2021; Recert 12/13/2021-03/07/2022    Progress Note Due on Visit 110    PT Start Time 1600    PT Stop Time 1645    PT Time Calculation (min) 45 min    Equipment Utilized During Treatment Gait belt   Hoyer lift   Activity Tolerance Patient tolerated treatment well   queasiness/nausea   Behavior During Therapy WFL for tasks assessed/performed                            Past Medical History:  Diagnosis Date   Diabetes mellitus    Hypertension    Stroke    Past Surgical History:  Procedure Laterality Date   IVC FILTER PLACEMENT ([facility] HX)     LEG SURGERY     PEG TUBE PLACEMENT     TRACHEOSTOMY     Patient Active Problem List   Diagnosis Date Noted   Sepsis due to vancomycin resistant Enterococcus species 06/06/2019   SIRS (systemic inflammatory response syndrome) 06/05/2019   Acute lower UTI 06/05/2019   VRE (vancomycin-resistant Enterococci) infection 06/05/2019   Anemia 06/05/2019   Skin ulcer of sacrum with necrosis of muscle    Urinary retention    Type 2 diabetes mellitus without complication, with long-term current use of insulin    Tachycardia    Lower extremity edema    Acute metabolic encephalopathy    Obstructive sleep apnea    Morbid obesity with BMI of 60.0-69.9, adult    Goals of care, counseling/discussion    Palliative care encounter    Sepsis 04/27/2019   H/O  insulin dependent diabetes mellitus 04/27/2019   History of CVA with residual deficit 04/27/2019   Seizure disorder 04/27/2019   Decubitus ulcer of sacral region, stage 4 04/27/2019    REFERRING DIAG: Cerebral infarction, unspecified   THERAPY DIAG:  Muscle weakness (generalized)  Other lack of coordination  History of CVA with residual deficit  Difficulty in walking, not elsewhere classified  Rationale for Evaluation and Treatment Rehabilitation  PERTINENT HISTORY: Jermell Holeman is a 25yoM who presents with severe weakness, quadriparesis, altered sensorium, and visual impairment s/p critical illness and prolonged hospitalization. Pt hospitalized in October 2020 with ARDS 2/2 COVID19 infection. Pt sustained a complex and lengthy hospitalization which included tracheostomy, prolonged sedation, ECMO. In this period pt sustained CVA and SDH. Pt has now been liberated from tracheostomy and G-tube. Pt has since been hospitalized for wound infection and UTI. Pt lives with parents at home, has hospital bed and left chair, hoyer lift transfers, and power WC for mobility needs. Pt needs heavy physical assistance with ADL 2/2 BUE contractures and motor dysfunction   PRECAUTIONS: Fall  SUBJECTIVE: Patient reports feeling better than earlier this week. Not as tight.    PAIN:   Are you having pain? No   TODAY'S TREATMENT:  -   Therapeutic Exercises:  All exercises performed with  Trunk exercises- lateral flex down toward forearms- 2 x 15 reps each Abdominal crunch into lumbar ext- x 20 reps    Cross body reaching with turning head x 15 reps each side   Overhead arm raise- keeping trunk erect- x 20 reps  Seated hip flex- Able to raise Left and right foot off floor on his own x 20 reps each today.    knee flex/ext AROM x 20 reps each LE - able to clear the floor and good slow motion    Pt educated throughout session about proper posture and technique with exercises. Improved exercise  technique, movement at target joints, use of target muscles after min to mod verbal, visual, tactile cues.       PATIENT EDUCATION: Education details: standing posture and exercise technique Person educated: Patient Education method: Explanation, Demonstration, Tactile cues, and Verbal cues Education comprehension: verbalized understanding, returned demonstration, verbal cues required, tactile cues required, and needs further education   HOME EXERCISE PROGRAM:  Access Code: [email or web address] URL: [email or web address] Date: 03/23/2022 Prepared by: Maureen Ralphs  Exercises - Supine Bridge  - 3 x weekly - 3 sets - 10 reps - 2 hold - Supine Gluteal Sets  - 3 x weekly - 3 sets - 10 reps - 5 sec hold - Supine Quad Set  - 1 x daily - 3 x weekly - 3 sets - 10 reps - 5 hold - Seated Long Arc Quad  - 1 x daily - 7 x weekly - 3 sets - 10 reps - Seated Hip Adduction Squeeze with Ball  - 1 x daily - 3 x weekly - 3 sets - 10 reps - 5 hold - Seated Hip Abduction  - 1 x daily - 3 x weekly - 3 sets - 10 reps - 2 hold    PT Short Term Goals -       PT SHORT TERM GOAL #1   Title Pt will be independent with HEP in order to improve strength and balance in order to decrease fall risk and improve function at home and work.    Baseline 01/04/2021= No formal HEP in place; 12/12 no HEP in place; 05/10/2021-Patient and his father were able to report compliance with curent HEP consisting of mostly seated/reclined LE strengthening. Both verbalize no questions at this time.    Time 6    Period Weeks    Status Achieved    Target Date 02/15/21              PT Long Term Goals - Date for completion: 06/01/2022         PT LONG TERM GOAL #1   Title Patient will increase BLE gross strength by 1/2 muscle grade to improve functional strength for improved independence with potential gait, increased standing tolerance and increased ADL ability.    Baseline 01/04/2021- Patient presents with 1/5 to  3-/5 B LE strength with MMT; 12/12: goal partially met for Left knee/hip; 05/10/2021= 2-/5 bilateal Hip flex; 3+/5 bilateral Knee ext; 06/21/2021= Patient presents with 2-/5 bilateral Hip flex; 3+/5 bilateral knee ext/flex; 2-/5 left ankle DF; 0/5 right ankle- and able to increase reps and resistance with LE's. 09/15/2021- Patient technically presents with 2-/5 B hip flex/abd/add - but he is able to raise his hip up to approx 100 deg which has improved. 3+/5 Bilateral knee ext, 2-/5 left ankle and 0/5 right ankle.  12/08/2021= Patient able to lift left knee at 110 deg of hip flex; presents  with 3+/5 knee ext, 2-/5 left ankle DF and 0/5 right ankle DF, 2-/5 bilateral Hip abd in seated position.   12/6: R: knee 3+/5 ext, 2/5 flexion, left knee 3+/5 extension, 3+/5 flexion, R hip: 2+/5 hip add, 2+/5 hip ABD L hip: 4-/5 hip ABD, 3+/5 hip ADD, 3+/5 hip flexion; 06/06/2022= Patient now presents with 2-/5 right ankle DF/PF;    Time 12    Period Weeks    Status MET   Target Date 03/07/2022     PT LONG TERM GOAL #2   Title Patient will tolerate sitting unsupported demonstrating erect sitting posture for 15 minutes with CGA to demonstrate improved back extensor strength and improved sitting tolerance.    Baseline 01/04/2021- Patient confied to sitting in lift chair or electric power chair with back support and unable to sit upright without physical assistance; 12/12: tolerates <1 minutes upright unsupported sitting. 05/10/2021=static sit with forward trunk lean  in his power wheelchair without back support x approx 3 min. 06/21/2021=Unable to assess today due to patient with acute back pain but on previous visit able to sit x 8 min without back support. 09/15/2021- on last visit- 09/13/2021- patient was able to sit unsupported x 8 min at edge of mat. 10/13/2023 - Patient was able to sit at edge of mat with varying level of assist today from SBA to min A for a total of 20 min. 12/13/2021= Patient demonstrated unsupported sitting at  edge of mat for approx 20 min   Time 12    Period Weeks    Status GOAL MET   Target Date 12/08/21      PT LONG TERM GOAL #3   Title Patient will demonstrate ability to perform static standing in // bars > 2 min with Max Assist  without loss of balance and fair posture for improved overall strength for pre-gait and transfer activities.    Baseline 01/04/2021= Patient current uanble to stand- Dependent on hoyer or sit to stand lift for transfers. 05/10/2021=Not appropriate yet- Currently still dependent with all transfers using hoyer. 06/21/2021= Patient continuing now to focus on LE strengthening to prepare for standing-unable to try today due to acute low back pain-  planning on attempting in new cert period. 09/15/2021- Patient has attempted standing 2x in past two week- max Assist of 2 people - only once was he successful to clearing his bottom from chair - Will continue to be a focus during the new certification. 12/13/2021= Patient has been limited secondary to increased overall low back pain during this certification and will require more time to focus on this goal.  12/6: not assessed this date, will assess at date when 2-3 PTs are present for assistance    Time 12    Period Weeks    Status GOAL not appropriate at this time - may attempt in future once Patient presents with improved overall LE strength.          PT LONG TERM GOAL #4   Title Pt will improve FOTO score by 10 points or more demonstrating improved perceived functional ability    Baseline FOTO 7 on 10/17; 03/15/21: FOTO 12; 05/10/2021 06/21/2021= 1; 09/15/2021= 9; 12/13/2021= Will issue next visit 12/6: 4; 06/06/2022= Will assess next visit   Time 12    Period Weeks    Status ONGOING   Target date 08/29/2022     PT LONG TERM GOAL #5   Title Patient will perform sit to stand transfer with appropriate AD and max assist  of 2 people with 75% consistency to prepare for pregait activities.    Baseline 09/15/2021= Patient unable to stand well-  unable to clear his bottom off chair with Max assist of 2 persons. 12/13/2021- Goal not appropriate to try yet but will keep and roll over to next cert as shift continues to focus on transfers/standing; 4/24= Patient able to perform active ankle DF/PF with right LE and able to raise his knee into seated march and clear floor without physical assist today - as previously unable as well as lift right knee ext to near full ROM to improve strength for eventual standing.    Time 12    Period Weeks    Status Goal still not appropriate for now but will keep active for future   Target date    PT LONG TERM GOAL #6  Title Patient will tolerate sitting unsupported demonstrating erect sitting posture for 30 minutes with CGA to demonstrate improved back extensor strength and improved sitting tolerance.   Baseline 12/13/2021= Patient demonstrated unsupported sitting at edge of mat for approx 20 min;  12/29/2021- Patient performed approx 30 min of dynamic sitting activities today. 06/06/2022= Patient demonstrated ability to sit and perform static and dynamic UE/LE movement with only Supervision.   Time 12   Period Weeks   Status GOAL MET         7.  Patient will tolerate 5 minutes or more of standing in sit to stand lift in order to indicate improved lower extremity weightbearing tolerance for progression to standing in parallel bars. Baseline: 1 minute on most recent stand 02/21/22; 06/06/2022- Patient did attempt today after complaining of right LE pain last week. He attempted 3 stands using sit to stand lift. - all over 1 min- last one approx 48 sec- stopped due to fatigue. More erect standing in lift today - still poor gluteal strength but able to activate glutes and extend hips upon command but unable to hold > 5 sec. 07/28/2022- Will attempt standing next visit but patient has been able to stand since last progress note for up to 3 min at a time at his best.  Goal status: ONGOING- Progressing Target date:  08/29/2022  8. Patient will tolerate sitting unsupported demonstrating ability to perform dynamic UE/LE activities for 30 minutes  independently to demonstrate ability to sit at edge of bed to eat or perform some ADL's/exercise for optimal quality of life.  Baseline: 06/06/2022- Patient able to sit and perform some UE/LE exercises but requires CGA at times for safety- he is able to static sit for 30 min with supervision. 07/27/2022= Patient able to now sit > 30 min with Supervision only - performing static and dynamic activities. Will keep goal active to ensure patient is consistent.   Goal Status: PROGRESSING  Target date: 08/29/2022       Plan     Clinical Impression Statement Patient presents with good motivation today. He participated in some goal testing- demonstrating some improvement overall with LE strength as seen by ability to raise his legs more (march and kick) and ability to sit with only supervision and perform 30+ min of dynamic activity without any physical assist. Patient's condition has the potential to improve in response to therapy. Maximum improvement is yet to be obtained. The anticipated improvement is attainable and reasonable in a generally predictable time. Pt will continue to benefit from skilled physical therapy intervention to address impairments, improve QOL, and attain therapy goals.    Personal Factors and  Comorbidities Comorbidity 3+;Time since onset of injury/illness/exacerbation    Comorbidities CVA, diabetes, Seizures    Examination-Activity Limitations Bathing;Bed Mobility;Bend;Caring for Others;Carry;Dressing;Hygiene/Grooming;Lift;Locomotion Level;Reach Overhead;Self Feeding;Sit;Squat;Stairs;Stand;Transfers;Toileting    Examination-Participation Restrictions Cleaning;Community Activity;Driving;Laundry;Medication Management;Meal Prep;Occupation;Personal Finances;Shop;Yard Work;Volunteer    Stability/Clinical Decision Making Evolving/Moderate complexity    Rehab  Potential Fair    PT Frequency 2x / week    PT Duration 12 weeks    PT Treatment/Interventions ADLs/Self Care Home Management;Cryotherapy;Electrical Stimulation;Moist Heat;Ultrasound;DME Instruction;Gait training;Stair training;Functional mobility training;Therapeutic exercise;Balance training;Patient/family education;Orthotic Fit/Training;Neuromuscular re-education;Wheelchair mobility training;Manual techniques;Passive range of motion;Dry needling;Energy conservation;Taping;Visual/perceptual remediation/compensation;Joint Manipulations    PT Next Visit Plan core strength- sitting at EOM, postural control, sit to stand if able; Continue with progressive LE Strengthening.    PT Home Exercise Plan No changes to HEP today    Consulted and Agree with Plan of Care Patient;Family member/caregiver    Family Member Consulted Mom             Louis Meckel, PT Physical Therapist- [facility]  [facility]  07/28/2022, 11:07 AM

## 2022-08-01 ENCOUNTER — Ambulatory Visit: Payer: BC Managed Care – PPO

## 2022-08-01 ENCOUNTER — Ambulatory Visit: Payer: BC Managed Care – PPO | Admitting: Occupational Therapy

## 2022-08-03 ENCOUNTER — Ambulatory Visit: Payer: BC Managed Care – PPO

## 2022-08-03 ENCOUNTER — Ambulatory Visit: Payer: BC Managed Care – PPO | Admitting: Occupational Therapy

## 2022-08-08 ENCOUNTER — Ambulatory Visit: Payer: BC Managed Care – PPO | Attending: Physician Assistant

## 2022-08-08 ENCOUNTER — Ambulatory Visit: Payer: BC Managed Care – PPO | Admitting: Occupational Therapy

## 2022-08-08 DIAGNOSIS — R262 Difficulty in walking, not elsewhere classified: Secondary | ICD-10-CM

## 2022-08-08 DIAGNOSIS — R2681 Unsteadiness on feet: Secondary | ICD-10-CM | POA: Diagnosis not present

## 2022-08-08 DIAGNOSIS — M6281 Muscle weakness (generalized): Secondary | ICD-10-CM

## 2022-08-08 DIAGNOSIS — R278 Other lack of coordination: Secondary | ICD-10-CM | POA: Diagnosis not present

## 2022-08-08 DIAGNOSIS — I6930 Unspecified sequelae of cerebral infarction: Secondary | ICD-10-CM | POA: Diagnosis not present

## 2022-08-08 NOTE — Therapy (Addendum)
Occupational Therapy Progress Note  Patient Name: Paul Hall MRN: [id number] DOB:10/16/1995, 27 y.o., male Today's Date: 07/27/2022  PCP: Dr. Sherwood Gambler REFERRING PROVIDER: Dr. Sherwood Gambler   OT End of Session -    Visit Number 33   Number of Visits 36    Date for OT Re-Evaluation 08/17/22    Authorization Type Progress report period starting 10/06/2021    OT Start Time 1650    OT Stop Time 1730    OT Time Calculation (min) 40 min    Activity Tolerance Patient tolerated treatment well    Behavior During Therapy WFL for tasks assessed/performed                     Past Medical History:  Diagnosis Date   Diabetes mellitus    Hypertension    Stroke    Past Surgical History:  Procedure Laterality Date   IVC FILTER PLACEMENT ([facility] HX)     LEG SURGERY     PEG TUBE PLACEMENT     TRACHEOSTOMY     Patient Active Problem List   Diagnosis Date Noted   Sepsis due to vancomycin resistant Enterococcus species 06/06/2019   SIRS (systemic inflammatory response syndrome) 06/05/2019   Acute lower UTI 06/05/2019   VRE (vancomycin-resistant Enterococci) infection 06/05/2019   Anemia 06/05/2019   Skin ulcer of sacrum with necrosis of muscle    Urinary retention    Type 2 diabetes mellitus without complication, with long-term current use of insulin    Tachycardia    Lower extremity edema    Acute metabolic encephalopathy    Obstructive sleep apnea    Morbid obesity with BMI of 60.0-69.9, adult    Goals of care, counseling/discussion    Palliative care encounter    Sepsis 04/27/2019   H/O insulin dependent diabetes mellitus 04/27/2019   History of CVA with residual deficit 04/27/2019   Seizure disorder 04/27/2019   Decubitus ulcer of sacral region, stage 4 04/27/2019    ONSET DATE: 01/2019  REFERRING DIAG: CVA/COVID-19  THERAPY DIAG:  Muscle weakness (generalized)  Other lack of coordination  History of CVA with residual deficit  Low vision, both  eyes  Rationale for Evaluation and Treatment Rehabilitation  SUBJECTIVE:   SUBJECTIVE STATEMENT:  Pt /caregiver report having to cancel Wednesday 2/2 his mother attending [facility] caregiver training  Pt accompanied by: self and family member  PERTINENT HISTORY:  Pt. is a 27 y.o. male who was diagnosed with COVID-19, and CVA with resultant quadriplegia. Pt. Was then hospitalized with VRE UTI. PMHx includes: urinary retention, seizure disorder, obstructive sleep disorder, DM Type II, Morbid obesity.   PRECAUTIONS: None  WEIGHT BEARING RESTRICTIONS No  PAIN:  Are you having pain? No-soreness in right forearm from recent Botox.  FALLS: Has patient fallen in last 6 months? No  LIVING ENVIRONMENT: Lives with: lives with their family Lives in: House/apartment Stairs: No Level Entry Has following equipment at home: Wheelchair (power) and hoyer lift, sit to stand lift  PLOF: Independent  PATIENT GOALS: To be able to engage in more daily care tasks.  OBJECTIVE:   HAND DOMINANCE: Right  ADLs:  Transfers/ambulation related to ADLs: Eating: Pt. reports being able to hold standard utensils, and is starting to engage more in self-feeding tasks, hand to mouth patterns. Pt. Reports that he does as much as he can with the task, and family assists  with the remainder of the task. Grooming: Pt. Is able to initiate holding an electric toothbrush, and  brush his teeth. Family assists LB Dressing: Total Assist UE dressing: Pt. is now able to reach up to actively assist with grasping , and pulling his gown down. Toileting:  Total Assist Bathing: MaxA UB, Total assist LB Tub Shower transfers: N/A Equipment: See above    IADLs: Shopping: Relies on family to assist Light housekeeping: Total Assist Meal Prep: Total Assist Community mobility:   Medication management:  Total Assist  Financial management: N/A Handwriting: Not legible: Pt. Is able to hold a pen with the left hand, and initiate  marking the page. Pt.'s eye glasses were not available  MOBILITY STATUS:  Power w/c  POSTURE COMMENTS:  Pt. Requires position changes in his power w/c  ACTIVITY TOLERANCE: Activity tolerance:  Fair  FUNCTIONAL OUTCOME MEASURES: FOTO: 40  TR score: 45  05/25/2022:   FOTO: 50 TR score: 45  07/20/22: FOTO 55  UPPER EXTREMITY ROM     Active ROM Right eval Right 03/21/2022 Right 05/25/2022 R  07/20/22 Right  07/25/22  Left eval Left  03/21/2022 Left 05/25/2022 L  07/20/22 Left 07/25/2022  Shoulder flexion 106 scaption 105  Scaption 62 flexion 110 scaption 100   119 123 125 130   Shoulder abduction 114 90 97 100   110 115 115 115   Shoulder adduction             Shoulder extension             Shoulder internal rotation             Shoulder external rotation             Elbow flexion 120(130) 145 145 140   135(135) 145 145    Elbow extension -45(-35) -22(-35) -28(-22)  -32(-21)  -27(-18) -21(-20) -20(-16)  -25(-10)  Wrist flexion             Wrist extension -30(10) -25(10) -10(30)  -10(20)  10(50) 19(50) 28(50)  30(50)  Wrist ulnar deviation             Wrist radial deviation             Wrist pronation             Wrist supination   Limited by flexor tone      Limited by flexor tone    (Blank rows = not tested)      UPPER EXTREMITY MMT:     Right eval Right 03/21/2022 Right  05/25/2022 R 07/20/22  Left eval Left 03/21/2022 Left  05/25/2022 L 07/20/22  Shoulder flexion 3-/5 scaption 3-/5 3-/5 3+/5  3-/5 3/5 3+/5 4+/5  Shoulder abduction 3-/5 3-/5 3-/5 4/5  3-/5 3-/5 4-/5 4+/5  Shoulder adduction           Shoulder extension           Shoulder internal rotation           Shoulder external rotation           Elbow flexion 3/5 3/5 3+/5 4/5  3/5 3+/5 4-/5 4+/5  Elbow extension 2-/5 2/5 3-/5 4+/5  2-/5 2/5  4+/5  Wrist flexion           Wrist extension 2-/5 2-/5 2/5 2/5  2/5 2+/5 3-/5   Wrist ulnar deviation           Wrist radial deviation           Wrist pronation            Wrist supination           (  Blank rows = not tested)     HAND FUNCTION:  Grip strength: Right: 0 lbs; Left: 0 lbs and Lateral pinch: Right: 5 lbs, Left: 2 lbs  03/21/2022: Lateral pinch: Right: 3.5 lbs, Left: 2 lbs  07/20/22: Grip strength: Right: 3 lbs; Left: 4 lbs and Lateral pinch: Right: 5 lbs, Left: 3 lbs  Bilateral digit PIP/DIP flexion contractures with MP hyperextension with attempts for AROM. Pt. is able to tolerate AROM to the bilateral digits at the initial evaluation however, has a history of pain in the digits.  COORDINATION:  Eval: Pt. is unable to grasp 9-hole test pegs. Pt. is able to initiate grasping larger pegs, and is able to hold a pen in the left hand.  07/20/22: 2 min 36 seconds to remove 9 pegs from 9 hole peg test - cues to locate pegs 2/2 low vision. Pt. is able to initiate grasping larger pegs on R hand and is able to hold a pen in the left hand.  SENSATION: Light touch: Impaired   EDEMA:  N/A  MUSCLE TONE: BUE flexor Spasticity  COGNITION Overall cognitive status: Continue to assess in functional context  VISION:   Subjective report: Pt. was not wearing glasses at the time of the initial eval.  Baseline vision: Vision is very limited. Wears glasses all the time Visual history: History of impaired vision following CVA. Pt. Has received treatment through the [facility] low vision rehabilitation program.   VISION ASSESSMENT: Impaired To be further assessed in functional context  PERCEPTION: Impaired   PRAXIS: Impaired: motor planning  OBSERVATIONS:  Pt reports being on Tramadol   TODAY'S TREATMENT:     Therapeutic Exercise:  Pt completed seated BUE therex using boxing speed bag set at head height; tolerated 15 min with intermittent rest breaks and 2#  upgraded to 3#, followed by 5# wrist weight to L arm. Pt. Required support at the elbow to hit the speed bag pt hit bag consistently alternating L and R hand.  Pt. was able to tolerate  the addition of 2# cuff weight to the right.   Neuromuscular re-education:  Pt. Worked on using the left hand to grasp and slide washers, off of the edge of a raised surface with the 2nd digit to the thumb. The task was modified to 1/2" raised flat marbles, followed by square flat tiles.  Pt grasped plastic water bottle and completed self-drinking with setup assist to grasp water bottle.  PATIENT EDUCATION: Education details: There. Halford Decamp Person educated: Patient and Parent Education method: Explanation, Demonstration, Tactile cues, and Verbal cues Education comprehension: verbalized understanding, returned demonstration, verbal cues required, and needs further education   HOME EXERCISE PROGRAM:   Continue ongoing assessment, and continue to provide as needed.     GOALS: Goals reviewed with patient? Yes  SHORT TERM GOALS: Target date:05/03/2022     To assess splint fit, and make appropriate adjustments to promote good skin integrity through the palmar surface of the bilateral hands.  Baseline: 05/25/22: Goal currently met, however ongoing as needs to assess splint fit arise. 03/23/2022: Pt. is wearing splints a couple of hours at night bilateral resting hand splints. 03/21/2022: Pt. is wearing splints a couple of hours at night bilateral resting hand splints. Goal status: Continue ongoing assessment as requested, and as needed   LONG TERM GOALS: Target date: 06/13/2022     FOTO score will Improve by 2 points for Pt. perceived improvement with the assessment specific ADL/IADL tasks.  Baseline: 05/25/2022: FOTO score: 50,  TR score: 45 Eval: FOTO score: 40,  TR score: 45 Goal status: Achieved   2.   Pt. will independently perform oral care for 100% of the task after complete set-up. Baseline: 07/20/22: completes 90% of task, limited by shoulder flexion. 05/25/2022:  Pt. Is able to initiate and perform oral care for approximately 90% of the task. Complete set-up required. Assi  needed only for the very back teeth. 03/23/2022: Pt. Is able to initiate and perform oral care for approximately 75% of the task. Pt. Requires assist at proximally at the elbow for through oral care. 03/21/2022: Pt. Is able to initiate and perform oral care for approximately 75% of the task. Pt. Requires assist at proximally at the elbow for through oral care. Eval: Pt. is able to initiate using an electric toothbrush. Pt. requires assist for set-up, and assist for thoroughness, and as he Pt. fatigues. Goal status: Ongoing  3.  Pt. Will be independent with self-feeding for 100% of the meal after complete set-up Baseline: 07/20/22: self-feeds cereal using spoon 90% of task. 05/25/2022: Pt. Is able to use a spoon to scoop cereal when feeding himself cereal 85% of the time. Pt. Is able to feed himself snack/finger foods 100% of the time. Pt. Continues to work on consistency of  stabilizing a cup/mug when drinking. Pt. Is able to grasp a water bottle with assist initially, with assist tapering off as he drinks.03/23/2022: Pt. Is able to perform scooping cereal for 75% of the time. Pt. required assist, and support at the left elbow, and Pt. Presents with limited forearm supination when using the spoon, and bringing it towards his mouth. Pt. Is able to use a fork to spear items, and perform the hand to mouth pattern.  03/21/2022: Pt. Is able to perform scooping cereal for 75% of the time. Pt. required assist, and support at the left elbow, and Pt. presents with limited forearm supination when using the spoon, and bringing it towards his mouth. Pt. Is able to use a fork to spear items, and perform the hand to mouth pattern.  Eval: Pt. is able to hold standard standard utensils. Pt. Performs as much of the task as he, can and has assistance for the remainder. Goal status: Ongoing  4.  Pt. Will improve grasp patterns and consistently grasp 1/2" objects for ADL, and IADL tasks.  Baseline: 07/20/22: grasps 1/4" pegs with  min a + visual cues, consistently grasping 1/2" objects with visual cues. 05/25/2022: Pt. Is working on improving consistency of grasping 1/2" objects with visual cues.  03/23/2022: Pt. Is able to grasp 1" objects consistently,and continues to work on the hand patterns needed to grasp 1/2" objects.03/21/2022: Pt. Is able to grasp 1" objects consistently,and continues to work on the hand patterns needed to grasp 1/2" objects. Eval: Pt. is able to grasp 1" objects intermittently using a lateral grasp pattern. Goal status: Ongoing  5.  Pt. will independently write his name legibly with letter sizes under 2". Baseline: 07/20/22: stabilizing assist to write name with 75% legibility with 2" letters. 05/25/2022: Pt. to continue to work towards formulating grasp patterns in preparation for grasping a large width pen. Pt. Requires visual cues. 03/23/2022: Pt. Is able to write his name with modA, however has difficulty with formulating letter sizes less than 2" in size with 50% legibility for the 3 letters of his name.03/21/2022: Pt. Is able to write his name with modA, however has difficulty with formulating letter sizes less than 2" in size with 50%  legibility for the 3 letters of his name. Eval: Pt is able to hold a thin marker with his left hand, and formulate a line, and initiate a circular pattern (Pt. without glasses today) Goal status: Ongoing  6. Pt. Will reach up to comb/brush his hair  with minA.  Baseline: 07/20/22: reaches 75% of head, assist for far R side of head, fatigues quickly. 05/25/2022: Pt. Is able to reach up with the left hand to the left side, top, and back of his head. 03/23/2022: Pt. is now able to more consistently initiate reaching up to his head with his left hand in preparation for haircare12/18/2023: Pt. is now able to more consistently initiate reaching up to his head with his left hand in preparation for haircare. Eval: Pt. is able to initiate reaching up for hair care with a long handled  brush, however is unable to sustain UE's in elevation to perform the task.     Goal status: Ongoing  7. Pt. Will independently navigate the w/c through his environment with minA with visual scanning, and hand placement on the controls.  Baseline: 07/20/22: Pt. Requires minA to setup hand on controls and MIN cues to navigate the w/c in wide spaces, requires MIN - MOD cues to navigate the w/c through more narrow doorways, and tighter turns - varies based on good vs bad vision days. 05/25/2022: Pt. Requires minA  and  cues to navigate the w/c in wide spaces, and requires Mod cues to navigate the w/c through more narrow doorways, and tighter turns. Pt. Requires max cues for scanning through the environment, and moderate cues for hand placement on the controls.     Goal status:Ongoing       ASSESSMENT:  CLINICAL IMPRESSION:  Pt. required set-up for drinking from a water bottle, and required assist to initiate grasp. Pt was highly motivated to complete speed boxing bag with 2# wrist weight on L arm, increased to 3# then 5# consecutively. Pt. was able to tolerate the addition of 2# on the right  with cues, and support at the elbow to fully extend R arm. Pt. was able to tolerate the addition of the alternating bilateral hands. Pt. Required visual cues, and assist to perform [facility]. Pt. was able to slide and use the left 2nd digit to grasp flat square tiles, and flat marbles off the edge of a platform to the thumb. Pt. Presented with difficulty with flat washers, however was able to grasp one when they were stacked one on top of the other.  Pt. continues to work on improving BUE functioning, w/c navigation, and visual compensatory strategies to improve, and maximize independence with ADLs, and IADLs.     PERFORMANCE DEFICITS in functional skills including ADLs, IADLs, coordination, dexterity, proprioception, ROM, strength, pain, FMC, GMC, decreased knowledge of use of DME, and UE functional use, cognitive  skills including safety awareness, and psychosocial skills including coping strategies, environmental adaptation, habits, and routines and behaviors.   IMPAIRMENTS are limiting patient from ADLs, IADLs, leisure, and social participation.   COMORBIDITIES may have co-morbidities  that affects occupational performance. Patient will benefit from skilled OT to address above impairments and improve overall function.  MODIFICATION OR ASSISTANCE TO COMPLETE EVALUATION: Maximum or significant modification of tasks or assist is necessary to complete an evaluation.  OT OCCUPATIONAL PROFILE AND HISTORY: Comprehensive assessment: Review of records and extensive additional review of physical, cognitive, psychosocial history related to current functional performance.  CLINICAL DECISION MAKING: High - multiple treatment options, significant  modification of task necessary  REHAB POTENTIAL: Fair    EVALUATION COMPLEXITY: High    PLAN: OT FREQUENCY: 2 x's a week  OT DURATION:12 weeks  PLANNED INTERVENTIONS: self care/ADL training, therapeutic exercise, therapeutic activity, neuromuscular re-education, manual therapy, passive range of motion, patient/family education, and cognitive remediation/compensation  RECOMMENDED OTHER SERVICES: PT  CONSULTED AND AGREED WITH PLAN OF CARE: Patient and family member/caregiver  PLAN FOR NEXT SESSION: Initiate treatment  Olegario Messier, MS, OTR/L

## 2022-08-08 NOTE — Therapy (Addendum)
OUTPATIENT PHYSICAL THERAPY TREATMENT NOTE   Patient Name: Paul Hall MRN: [id number] DOB:Aug 15, 1995, 27 y.o., male Today's Date: 08/08/2022  PCP:  Cindi Carbon PROVIDER:  Lenise Herald, PA-C   PT End of Session - 08/08/22 1701     Visit Number 101    Number of Visits 114    Date for PT Re-Evaluation 08/29/22    Authorization Type [employer] and [employer]- Need auth past 12th visit    Authorization Time Period 01/04/21-03/29/21; Recert 03/24/2021-06/16/2021; Recert 09/15/2021- 12/08/2021; Recert 12/13/2021-03/07/2022    Progress Note Due on Visit 110    PT Start Time 1601    PT Stop Time 1645    PT Time Calculation (min) 44 min    Equipment Utilized During Treatment Gait belt   Hoyer lift   Activity Tolerance Patient tolerated treatment well   queasiness/nausea   Behavior During Therapy WFL for tasks assessed/performed                            Past Medical History:  Diagnosis Date   Diabetes mellitus (HCC)    Hypertension    Stroke [facility])    Past Surgical History:  Procedure Laterality Date   IVC FILTER PLACEMENT ([facility] HX)     LEG SURGERY     PEG TUBE PLACEMENT     TRACHEOSTOMY     Patient Active Problem List   Diagnosis Date Noted   Sepsis due to vancomycin resistant Enterococcus species (HCC) 06/06/2019   SIRS (systemic inflammatory response syndrome) (HCC) 06/05/2019   Acute lower UTI 06/05/2019   VRE (vancomycin-resistant Enterococci) infection 06/05/2019   Anemia 06/05/2019   Skin ulcer of sacrum with necrosis of muscle (HCC)    Urinary retention    Type 2 diabetes mellitus without complication, with long-term current use of insulin (HCC)    Tachycardia    Lower extremity edema    Acute metabolic encephalopathy    Obstructive sleep apnea    Morbid obesity with BMI of 60.0-69.9, adult (HCC)    Goals of care, counseling/discussion    Palliative care encounter    Sepsis (HCC) 04/27/2019   H/O insulin dependent diabetes mellitus  04/27/2019   History of CVA with residual deficit 04/27/2019   Seizure disorder (HCC) 04/27/2019   Decubitus ulcer of sacral region, stage 4 (HCC) 04/27/2019    REFERRING DIAG: Cerebral infarction, unspecified   THERAPY DIAG:  Muscle weakness (generalized)  Other lack of coordination  History of CVA with residual deficit  Difficulty in walking, not elsewhere classified  Rationale for Evaluation and Treatment Rehabilitation  PERTINENT HISTORY: Paul Hall is a 25yoM who presents with severe weakness, quadriparesis, altered sensorium, and visual impairment s/p critical illness and prolonged hospitalization. Pt hospitalized in October 2020 with ARDS 2/2 COVID19 infection. Pt sustained a complex and lengthy hospitalization which included tracheostomy, prolonged sedation, ECMO. In this period pt sustained CVA and SDH. Pt has now been liberated from tracheostomy and G-tube. Pt has since been hospitalized for wound infection and UTI. Pt lives with parents at home, has hospital bed and left chair, hoyer lift transfers, and power WC for mobility needs. Pt needs heavy physical assistance with ADL 2/2 BUE contractures and motor dysfunction   PRECAUTIONS: Fall  SUBJECTIVE: Patient reports doing well so far this week with no new issues.   PAIN:   Are you having pain? No   TODAY'S TREATMENT:  -  *Patient dependently hoyered from power wheelchair  to edge  of mat with assist of his mother and Chartered loss adjuster.    Therapeutic Exercises: Sitting at edge of mat while performing the following:    Trunk exercises- Isometric abdominal crunch - hold 5 sec x 8 reps Abdominal crunch into lumbar ext- x 20 reps    Cross body reaching with elbow flex /ext- reaching each arm out x 15 reps each side   Overhead arm raise- keeping trunk erect- x 20 reps while trying to hold onto pvc pipe  Seated hip flex- Able to raise Left and right foot off floor on his own x 20 reps each today.   Seated calf raises (from  1/2 foam) 2 sets of 12 reps on left LE and AAROM - 2sets of 12 reps    Knee ext AROM x 20 reps each LE - able to clear the foot off the foam (for added level of difficulty) and good slow motion  Checked BP due to some blurried vision with straining today- 148/104 mmHg- symptoms resolved with rest.    Pt educated throughout session about proper posture and technique with exercises. Improved exercise technique, movement at target joints, use of target muscles after min to mod verbal, visual, tactile cues.       PATIENT EDUCATION: Education details: standing posture and exercise technique Person educated: Patient Education method: Explanation, Demonstration, Tactile cues, and Verbal cues Education comprehension: verbalized understanding, returned demonstration, verbal cues required, tactile cues required, and needs further education   HOME EXERCISE PROGRAM:  Access Code: [email or web address] URL: [email or web address] Date: 03/23/2022 Prepared by: Maureen Ralphs  Exercises - Supine Bridge  - 3 x weekly - 3 sets - 10 reps - 2 hold - Supine Gluteal Sets  - 3 x weekly - 3 sets - 10 reps - 5 sec hold - Supine Quad Set  - 1 x daily - 3 x weekly - 3 sets - 10 reps - 5 hold - Seated Long Arc Quad  - 1 x daily - 7 x weekly - 3 sets - 10 reps - Seated Hip Adduction Squeeze with Ball  - 1 x daily - 3 x weekly - 3 sets - 10 reps - 5 hold - Seated Hip Abduction  - 1 x daily - 3 x weekly - 3 sets - 10 reps - 2 hold    PT Short Term Goals -       PT SHORT TERM GOAL #1   Title Pt will be independent with HEP in order to improve strength and balance in order to decrease fall risk and improve function at home and work.    Baseline 01/04/2021= No formal HEP in place; 12/12 no HEP in place; 05/10/2021-Patient and his father were able to report compliance with curent HEP consisting of mostly seated/reclined LE strengthening. Both verbalize no questions at this time.    Time 6    Period Weeks     Status Achieved    Target Date 02/15/21              PT Long Term Goals - Date for completion: 06/01/2022         PT LONG TERM GOAL #1   Title Patient will increase BLE gross strength by 1/2 muscle grade to improve functional strength for improved independence with potential gait, increased standing tolerance and increased ADL ability.    Baseline 01/04/2021- Patient presents with 1/5 to 3-/5 B LE strength with MMT; 12/12: goal partially met for Left knee/hip; 05/10/2021= 2-/5 bilateal Hip flex; 3+/5 bilateral Knee  ext; 06/21/2021= Patient presents with 2-/5 bilateral Hip flex; 3+/5 bilateral knee ext/flex; 2-/5 left ankle DF; 0/5 right ankle- and able to increase reps and resistance with LE's. 09/15/2021- Patient technically presents with 2-/5 B hip flex/abd/add - but he is able to raise his hip up to approx 100 deg which has improved. 3+/5 Bilateral knee ext, 2-/5 left ankle and 0/5 right ankle.  12/08/2021= Patient able to lift left knee at 110 deg of hip flex; presents with 3+/5 knee ext, 2-/5 left ankle DF and 0/5 right ankle DF, 2-/5 bilateral Hip abd in seated position.   12/6: R: knee 3+/5 ext, 2/5 flexion, left knee 3+/5 extension, 3+/5 flexion, R hip: 2+/5 hip add, 2+/5 hip ABD L hip: 4-/5 hip ABD, 3+/5 hip ADD, 3+/5 hip flexion; 06/06/2022= Patient now presents with 2-/5 right ankle DF/PF;    Time 12    Period Weeks    Status MET   Target Date 03/07/2022     PT LONG TERM GOAL #2   Title Patient will tolerate sitting unsupported demonstrating erect sitting posture for 15 minutes with CGA to demonstrate improved back extensor strength and improved sitting tolerance.    Baseline 01/04/2021- Patient confied to sitting in lift chair or electric power chair with back support and unable to sit upright without physical assistance; 12/12: tolerates <1 minutes upright unsupported sitting. 05/10/2021=static sit with forward trunk lean  in his power wheelchair without back support x approx 3 min.  06/21/2021=Unable to assess today due to patient with acute back pain but on previous visit able to sit x 8 min without back support. 09/15/2021- on last visit- 09/13/2021- patient was able to sit unsupported x 8 min at edge of mat. 10/13/2023 - Patient was able to sit at edge of mat with varying level of assist today from SBA to min A for a total of 20 min. 12/13/2021= Patient demonstrated unsupported sitting at edge of mat for approx 20 min   Time 12    Period Weeks    Status GOAL MET   Target Date 12/08/21      PT LONG TERM GOAL #3   Title Patient will demonstrate ability to perform static standing in // bars > 2 min with Max Assist  without loss of balance and fair posture for improved overall strength for pre-gait and transfer activities.    Baseline 01/04/2021= Patient current uanble to stand- Dependent on hoyer or sit to stand lift for transfers. 05/10/2021=Not appropriate yet- Currently still dependent with all transfers using hoyer. 06/21/2021= Patient continuing now to focus on LE strengthening to prepare for standing-unable to try today due to acute low back pain-  planning on attempting in new cert period. 09/15/2021- Patient has attempted standing 2x in past two week- max Assist of 2 people - only once was he successful to clearing his bottom from chair - Will continue to be a focus during the new certification. 12/13/2021= Patient has been limited secondary to increased overall low back pain during this certification and will require more time to focus on this goal.  12/6: not assessed this date, will assess at date when 2-3 PTs are present for assistance    Time 12    Period Weeks    Status GOAL not appropriate at this time - may attempt in future once Patient presents with improved overall LE strength.          PT LONG TERM GOAL #4   Title Pt will improve FOTO score by  10 points or more demonstrating improved perceived functional ability    Baseline FOTO 7 on 10/17; 03/15/21: FOTO 12; 05/10/2021  06/21/2021= 1; 09/15/2021= 9; 12/13/2021= Will issue next visit 12/6: 4; 06/06/2022= Will assess next visit   Time 12    Period Weeks    Status ONGOING   Target date 08/29/2022     PT LONG TERM GOAL #5   Title Patient will perform sit to stand transfer with appropriate AD and max assist of 2 people with 75% consistency to prepare for pregait activities.    Baseline 09/15/2021= Patient unable to stand well- unable to clear his bottom off chair with Max assist of 2 persons. 12/13/2021- Goal not appropriate to try yet but will keep and roll over to next cert as shift continues to focus on transfers/standing; 4/24= Patient able to perform active ankle DF/PF with right LE and able to raise his knee into seated march and clear floor without physical assist today - as previously unable as well as lift right knee ext to near full ROM to improve strength for eventual standing.    Time 12    Period Weeks    Status Goal still not appropriate for now but will keep active for future   Target date    PT LONG TERM GOAL #6  Title Patient will tolerate sitting unsupported demonstrating erect sitting posture for 30 minutes with CGA to demonstrate improved back extensor strength and improved sitting tolerance.   Baseline 12/13/2021= Patient demonstrated unsupported sitting at edge of mat for approx 20 min;  12/29/2021- Patient performed approx 30 min of dynamic sitting activities today. 06/06/2022= Patient demonstrated ability to sit and perform static and dynamic UE/LE movement with only Supervision.   Time 12   Period Weeks   Status GOAL MET         7.  Patient will tolerate 5 minutes or more of standing in sit to stand lift in order to indicate improved lower extremity weightbearing tolerance for progression to standing in parallel bars. Baseline: 1 minute on most recent stand 02/21/22; 06/06/2022- Patient did attempt today after complaining of right LE pain last week. He attempted 3 stands using sit to stand lift. - all  over 1 min- last one approx 48 sec- stopped due to fatigue. More erect standing in lift today - still poor gluteal strength but able to activate glutes and extend hips upon command but unable to hold > 5 sec. 07/28/2022- Will attempt standing next visit but patient has been able to stand since last progress note for up to 3 min at a time at his best.  Goal status: ONGOING- Progressing Target date: 08/29/2022  8. Patient will tolerate sitting unsupported demonstrating ability to perform dynamic UE/LE activities for 30 minutes  independently to demonstrate ability to sit at edge of bed to eat or perform some ADL's/exercise for optimal quality of life.  Baseline: 06/06/2022- Patient able to sit and perform some UE/LE exercises but requires CGA at times for safety- he is able to static sit for 30 min with supervision. 07/27/2022= Patient able to now sit > 30 min with Supervision only - performing static and dynamic activities. Will keep goal active to ensure patient is consistent.   Goal Status: PROGRESSING  Target date: 08/29/2022       Plan     Clinical Impression Statement Patient performed well- able to sit at edge of mat performing dynamic activities for nearly 40 min. He was able to lift his  left heel off foam for calf raise well overall today. He struggle with right LE lifting but able to complete with increased practice/concentration. No lingering blurred vision once resting.  Pt will continue to benefit from skilled physical therapy intervention to address impairments, improve QOL, and attain therapy goals.    Personal Factors and Comorbidities Comorbidity 3+;Time since onset of injury/illness/exacerbation    Comorbidities CVA, diabetes, Seizures    Examination-Activity Limitations Bathing;Bed Mobility;Bend;Caring for Others;Carry;Dressing;Hygiene/Grooming;Lift;Locomotion Level;Reach Overhead;Self Feeding;Sit;Squat;Stairs;Stand;Transfers;Toileting    Examination-Participation Restrictions  Cleaning;Community Activity;Driving;Laundry;Medication Management;Meal Prep;Occupation;Personal Finances;Shop;Yard Work;Volunteer    Stability/Clinical Decision Making Evolving/Moderate complexity    Rehab Potential Fair    PT Frequency 2x / week    PT Duration 12 weeks    PT Treatment/Interventions ADLs/Self Care Home Management;Cryotherapy;Electrical Stimulation;Moist Heat;Ultrasound;DME Instruction;Gait training;Stair training;Functional mobility training;Therapeutic exercise;Balance training;Patient/family education;Orthotic Fit/Training;Neuromuscular re-education;Wheelchair mobility training;Manual techniques;Passive range of motion;Dry needling;Energy conservation;Taping;Visual/perceptual remediation/compensation;Joint Manipulations    PT Next Visit Plan core strength- sitting at EOM, postural control, sit to stand if able; Continue with progressive LE Strengthening.    PT Home Exercise Plan No changes to HEP today    Consulted and Agree with Plan of Care Patient;Family member/caregiver    Family Member Consulted Mom             Louis Meckel, PT Physical Therapist- [facility]  [facility]  08/08/2022, 5:03 PM

## 2022-08-10 ENCOUNTER — Ambulatory Visit: Payer: BC Managed Care – PPO | Admitting: Occupational Therapy

## 2022-08-10 ENCOUNTER — Ambulatory Visit: Payer: BC Managed Care – PPO

## 2022-08-15 ENCOUNTER — Ambulatory Visit: Payer: BC Managed Care – PPO

## 2022-08-15 ENCOUNTER — Ambulatory Visit: Payer: BC Managed Care – PPO | Admitting: Occupational Therapy

## 2022-08-15 DIAGNOSIS — I6930 Unspecified sequelae of cerebral infarction: Secondary | ICD-10-CM

## 2022-08-15 DIAGNOSIS — R278 Other lack of coordination: Secondary | ICD-10-CM

## 2022-08-15 DIAGNOSIS — M6281 Muscle weakness (generalized): Secondary | ICD-10-CM

## 2022-08-15 DIAGNOSIS — R2681 Unsteadiness on feet: Secondary | ICD-10-CM | POA: Diagnosis not present

## 2022-08-15 DIAGNOSIS — R262 Difficulty in walking, not elsewhere classified: Secondary | ICD-10-CM

## 2022-08-15 NOTE — Therapy (Signed)
Occupational Therapy Progress/Recertification Note  Patient Name: Paul Hall MRN: [id number] DOB:1996-03-21, 27 y.o., male Today's Date: 08/15/2022  PCP: Dr. Sherwood Gambler REFERRING PROVIDER: Dr. Sherwood Gambler   OT End of Session - 08/15/22 1657     Visit Number 34    Number of Visits 60    Date for OT Re-Evaluation 11/07/22    OT Start Time 1650    OT Stop Time 1735    OT Time Calculation (min) 45 min    Activity Tolerance Patient tolerated treatment well    Behavior During Therapy WFL for tasks assessed/performed                     Past Medical History:  Diagnosis Date   Diabetes mellitus (HCC)    Hypertension    Stroke [facility])    Past Surgical History:  Procedure Laterality Date   IVC FILTER PLACEMENT ([facility] HX)     LEG SURGERY     PEG TUBE PLACEMENT     TRACHEOSTOMY     Patient Active Problem List   Diagnosis Date Noted   Sepsis due to vancomycin resistant Enterococcus species (HCC) 06/06/2019   SIRS (systemic inflammatory response syndrome) (HCC) 06/05/2019   Acute lower UTI 06/05/2019   VRE (vancomycin-resistant Enterococci) infection 06/05/2019   Anemia 06/05/2019   Skin ulcer of sacrum with necrosis of muscle (HCC)    Urinary retention    Type 2 diabetes mellitus without complication, with long-term current use of insulin (HCC)    Tachycardia    Lower extremity edema    Acute metabolic encephalopathy    Obstructive sleep apnea    Morbid obesity with BMI of 60.0-69.9, adult (HCC)    Goals of care, counseling/discussion    Palliative care encounter    Sepsis (HCC) 04/27/2019   H/O insulin dependent diabetes mellitus 04/27/2019   History of CVA with residual deficit 04/27/2019   Seizure disorder (HCC) 04/27/2019   Decubitus ulcer of sacral region, stage 4 (HCC) 04/27/2019    ONSET DATE: 01/2019  REFERRING DIAG: CVA/COVID-19  THERAPY DIAG:  Muscle weakness (generalized)  Other lack of coordination  Rationale for Evaluation and Treatment  Rehabilitation  SUBJECTIVE:   SUBJECTIVE STATEMENT:  Pt. Reports visiting with his grandmothers for Mother's Day.  Pt accompanied by: self and family member  PERTINENT HISTORY:  Pt. is a 27 y.o. male who was diagnosed with COVID-19, and CVA with resultant quadriplegia. Pt. Was then hospitalized with VRE UTI. PMHx includes: urinary retention, seizure disorder, obstructive sleep disorder, DM Type II, Morbid obesity.   PRECAUTIONS: None  WEIGHT BEARING RESTRICTIONS No  PAIN:  Are you having pain? No-soreness in right forearm from recent Botox.  FALLS: Has patient fallen in last 6 months? No  LIVING ENVIRONMENT: Lives with: lives with their family Lives in: House/apartment Stairs: No Level Entry Has following equipment at home: Wheelchair (power) and hoyer lift, sit to stand lift  PLOF: Independent  PATIENT GOALS: To be able to engage in more daily care tasks.  OBJECTIVE:   HAND DOMINANCE: Right  ADLs:  Transfers/ambulation related to ADLs: Eating: Pt. reports being able to hold standard utensils, and is starting to engage more in self-feeding tasks, hand to mouth patterns. Pt. Reports that he does as much as he can with the task, and family assists  with the remainder of the task. Grooming: Pt. Is able to initiate holding an electric toothbrush, and brush his teeth. Family assists LB Dressing: Total Assist UE dressing: Pt. is  now able to reach up to actively assist with grasping , and pulling his gown down. Toileting:  Total Assist Bathing: MaxA UB, Total assist LB Tub Shower transfers: N/A Equipment: See above    IADLs: Shopping: Relies on family to assist Light housekeeping: Total Assist Meal Prep: Total Assist Community mobility:   Medication management:  Total Assist  Financial management: N/A Handwriting: Not legible: Pt. Is able to hold a pen with the left hand, and initiate marking the page. Pt.'s eye glasses were not available  MOBILITY STATUS:  Power  w/c  POSTURE COMMENTS:  Pt. Requires position changes in his power w/c  ACTIVITY TOLERANCE: Activity tolerance:  Fair  FUNCTIONAL OUTCOME MEASURES: FOTO: 40  TR score: 45  05/25/2022:   FOTO: 50 TR score: 45  07/20/22: FOTO 55  UPPER EXTREMITY ROM     Active ROM Right eval Right 03/21/2022 Right 05/25/2022 R  07/20/22 Right  07/25/22 Right 08/15/2022 Left eval Left  03/21/2022 Left 05/25/2022 L  07/20/22 Left 07/25/2022 Left  08/15/2022  Shoulder flexion 106 scaption 105  Scaption 62 flexion 110 scaption 100  105 119 123 125 130  136  Shoulder abduction 114 90 97 100  105 110 115 115 115  118  Shoulder adduction              Shoulder extension              Shoulder internal rotation              Shoulder external rotation              Elbow flexion 120(130) 145 145 140  144 135(135) 145 145   145  Elbow extension -45(-35) -22(-35) -28(-22)  -32(-21) -32(-28) -27(-18) -21(-20) -20(-16)  -25(-10) -23(-10)  Wrist flexion              Wrist extension -30(10) -25(10) -10(30)  -10(20) -10(25) 10(50) 19(50) 28(50)  30(50) 35(55)  Wrist ulnar deviation              Wrist radial deviation              Wrist pronation              Wrist supination   Limited by flexor tone      Limited by flexor tone     (Blank rows = not tested)      UPPER EXTREMITY MMT:     Right eval Right 03/21/2022 Right  05/25/2022 R 07/20/22 Right 08/25/2022 Left eval Left 03/21/2022 Left  05/25/2022 L 07/20/22 Left  08/15/2022  Shoulder flexion 3-/5 scaption 3-/5 3-/5 3+/5 3+/5 3-/5 3/5 3+/5 4+/5 4+/5  Shoulder abduction 3-/5 3-/5 3-/5 4/5 4/5 3-/5 3-/5 4-/5 4+/5 4+/5  Shoulder adduction            Shoulder extension            Shoulder internal rotation            Shoulder external rotation            Elbow flexion 3/5 3/5 3+/5 4/5 4+/5 3/5 3+/5 4-/5 4+/5 5/5  Elbow extension 2-/5 2/5 3-/5 4+/5 4+/5 2-/5 2/5  4+/5 4+/5  Wrist flexion            Wrist extension 2-/5 2-/5 2/5 2/5 2/5 2/5 2+/5 3-/5   3-/5  Wrist ulnar deviation            Wrist radial deviation  Wrist pronation            Wrist supination            (Blank rows = not tested)     HAND FUNCTION:  Grip strength: Right: 0 lbs; Left: 0 lbs and Lateral pinch: Right: 5 lbs, Left: 2 lbs  03/21/2022: Lateral pinch: Right: 3.5 lbs, Left: 2 lbs  07/20/22: Grip strength: Right: 3 lbs; Left: 4 lbs and Lateral pinch: Right: 5 lbs, Left: 3 lbs  08/15/2022:  Grip strength: Right: 0 lbs; Left: 5 lbs and Lateral pinch: Right: 2 lbs, Left: 4 lbs  Bilateral digit PIP/DIP flexion contractures with MP hyperextension with attempts for AROM. Pt. is able to tolerate AROM to the bilateral digits at the initial evaluation however, has a history of pain in the digits.  COORDINATION:  Eval: Pt. is unable to grasp 9-hole test pegs. Pt. is able to initiate grasping larger pegs, and is able to hold a pen in the left hand.  07/20/22: 2 min 36 seconds to remove 9 pegs from 9 hole peg test - cues to locate pegs 2/2 low vision. Pt. is able to initiate grasping larger pegs on R hand and is able to hold a pen in the left hand.  08/15/2022:      Vision, and sensation limiting accuracy of 9 hole peg test results. Pt. Was able to grasp and remove vertical pegs intermittently with cues.    SENSATION: Light touch: Impaired   EDEMA:  N/A  MUSCLE TONE: BUE flexor Spasticity  COGNITION Overall cognitive status: Continue to assess in functional context  VISION:   Subjective report: Pt. was not wearing glasses at the time of the initial eval.  Baseline vision: Vision is very limited. Wears glasses all the time Visual history: History of impaired vision following CVA. Pt. Has received treatment through the [facility] low vision rehabilitation program.   VISION ASSESSMENT: Impaired To be further assessed in functional context  PERCEPTION: Impaired   PRAXIS: Impaired: motor planning  OBSERVATIONS:  Pt reports being on  Tramadol   TODAY'S TREATMENT:    Measurements were obtained, and goals were reviewed with the Pt.     PATIENT EDUCATION: Education details: There. Halford Decamp Person educated: Patient and Parent Education method: Explanation, Demonstration, Tactile cues, and Verbal cues Education comprehension: verbalized understanding, returned demonstration, verbal cues required, and needs further education   HOME EXERCISE PROGRAM:   Continue ongoing assessment, and continue to provide as needed.     GOALS: Goals reviewed with patient? Yes  SHORT TERM GOALS: Target date:09/26/2022       To assess splint fit, and make appropriate adjustments to promote good skin integrity through the palmar surface of the bilateral hands.  Baseline: 05/25/22: Goal currently met, however ongoing as needs to assess splint fit arise. 03/23/2022: Pt. is wearing splints a couple of hours at night bilateral resting hand splints. 03/21/2022: Pt. is wearing splints a couple of hours at night bilateral resting hand splints. Goal status: Continue ongoing assessment as requested, and as needed   LONG TERM GOALS: Target date: 11/07/2022    FOTO score will Improve by 2 points for Pt. perceived improvement with the assessment specific ADL/IADL tasks.  Baseline: 07/20/2022: FOTO 55 05/25/2022: FOTO score: 50,  TR score: 45 Eval: FOTO score: 40,  TR score: 45 Goal status: Achieved   2.   Pt. will independently perform oral care for 100% of the task after complete set-up. Baseline: 08/15/2022: Completes 90% of the  task, proximal assist at times required at the elbow. 07/20/22: completes 90% of task, limited by shoulder flexion. 05/25/2022:  Pt. Is able to initiate and perform oral care for approximately 90% of the task. Complete set-up required. Assi needed only for the very back teeth. 03/23/2022: Pt. Is able to initiate and perform oral care for approximately 75% of the task. Pt. Requires assist at proximally at the elbow for  through oral care. 03/21/2022: Pt. Is able to initiate and perform oral care for approximately 75% of the task. Pt. Requires assist at proximally at the elbow for through oral care. Eval: Pt. is able to initiate using an electric toothbrush. Pt. requires assist for set-up, and assist for thoroughness, and as he Pt. fatigues. Goal status: Ongoing  3.  Pt. Will be independent with self-feeding for 100% of the meal after complete set-up Baseline:08/15/2022:  90% using the spoon, assit at time with the forearm motion, and grip on the utensils. 100% for finger foods.  07/20/22: self-feeds cereal using spoon 90% of task. 05/25/2022: Pt. Is able to use a spoon to scoop cereal when feeding himself cereal 85% of the time. Pt. Is able to feed himself snack/finger foods 100% of the time. Pt. Continues to work on consistency of  stabilizing a cup/mug when drinking. Pt. Is able to grasp a water bottle with assist initially, with assist tapering off as he drinks.03/23/2022: Pt. Is able to perform scooping cereal for 75% of the time. Pt. required assist, and support at the left elbow, and Pt. Presents with limited forearm supination when using the spoon, and bringing it towards his mouth. Pt. Is able to use a fork to spear items, and perform the hand to mouth pattern.  03/21/2022: Pt. Is able to perform scooping cereal for 75% of the time. Pt. required assist, and support at the left elbow, and Pt. presents with limited forearm supination when using the spoon, and bringing it towards his mouth. Pt. Is able to use a fork to spear items, and perform the hand to mouth pattern.  Eval: Pt. is able to hold standard standard utensils. Pt. Performs as much of the task as he, can and has assistance for the remainder. Goal status: Ongoing  4.  Pt. Will improve grasp patterns and consistently grasp 1/4" objects for ADL, and IADL tasks.  Baseline: 08/15/2022: grasps 1/4" pegs with min a + visual cues, consistently grasping 1/2" objects  with visual cues. 07/20/22: grasps 1/4" pegs with min a + visual cues, consistently grasping 1/2" objects with visual cues. 05/25/2022: Pt. Is working on improving consistency of grasping 1/2" objects with visual cues.  03/23/2022: Pt. Is able to grasp 1" objects consistently,and continues to work on the hand patterns needed to grasp 1/2" objects.03/21/2022: Pt. Is able to grasp 1" objects consistently,and continues to work on the hand patterns needed to grasp 1/2" objects. Eval: Pt. is able to grasp 1" objects intermittently using a lateral grasp pattern. Goal status: Progressing; goal revised to 1/4" items  5.  Pt. will independently write his name legibly with letter sizes under 1". Baseline: 08/15/2022: Pt. Is able to write name with smaller letter size. Pt. Is signing name with a computer styus. Pt. Requires visual cues, and assist. 07/20/22: stabilizing assist to write name with 75% legibility with 2" letters. 05/25/2022: Pt. to continue to work towards formulating grasp patterns in preparation for grasping a large width pen. Pt. Requires visual cues. 03/23/2022: Pt. Is able to write his name with modA, however  has difficulty with formulating letter sizes less than 2" in size with 50% legibility for the 3 letters of his name.03/21/2022: Pt. Is able to write his name with modA, however has difficulty with formulating letter sizes less than 2" in size with 50% legibility for the 3 letters of his name. Eval: Pt is able to hold a thin marker with his left hand, and formulate a line, and initiate a circular pattern (Pt. without glasses today) Goal status: Progressing; Goal revised for more control with smaller writing size  6. Pt. Will reach up to comb/brush his hair  with minA.  Baseline: 5/13/202: 75% using a hair pick, assist to the right side of the head. 07/20/22: reaches 75% of head, assist for far R side of head, fatigues quickly. 05/25/2022: Pt. Is able to reach up with the left hand to the left side, top,  and back of his head. 03/23/2022: Pt. is now able to more consistently initiate reaching up to his head with his left hand in preparation for haircare12/18/2023: Pt. is now able to more consistently initiate reaching up to his head with his left hand in preparation for haircare. Eval: Pt. is able to initiate reaching up for hair care with a long handled brush, however is unable to sustain UE's in elevation to perform the task.     Goal status: Ongoing  7. Pt. Will independently navigate the w/c through his environment with minA with visual scanning, and hand placement on the controls.  Baseline: 08/15/2022: Pt. Requires minA to setup hand on controls and MIN cues to navigate the w/c in wide spaces, requires MIN - MOD cues to navigate the w/c through more narrow doorways, and tighter turns - varies based on good vs bad vision days.07/20/22: Pt. Requires minA to setup hand on controls and MIN cues to navigate the w/c in wide spaces, requires MIN - MOD cues to navigate the w/c through more narrow doorways, and tighter turns - varies based on good vs bad vision days. 05/25/2022: Pt. Requires minA  and  cues to navigate the w/c in wide spaces, and requires Mod cues to navigate the w/c through more narrow doorways, and tighter turns. Pt. Requires max cues for scanning through the environment, and moderate cues for hand placement on the controls.     Goal status: Ongoing    8. Pt. Will improve bilateral grip strength to be able to independently grasp, and pull up blankets, and linens.      Baseline: 08/15/2022: Pt. Has difficulty securely holding, and pulling up blankets, and linens.    Goal status: New   9. Pt. Will consistently actively control the releasing  of blankets, covers, and linens from his hands once they are in the desired position over him. Baseline: 08/15/2022: Pt. Has difficulty with controlled releasing of blankets/linens from his grasp.     Goal status: New   ASSESSMENT:  CLINICAL  IMPRESSION:  Measurements were obtained, and goals were reviewed with the Pt.  Is making progress with BUE ROM, strength, and [facility] skills. Pt. Is now able to pick up pistachios, and remove small crab shells from his mouth, Pt. Is able to use the left UE to use a hair pick with support proximally. Pt. Is consistently zipping clothing more independently with set-up, and stabilization. Pt. Is doffing his own gown independently, and using a stylus to sign his name, and flip through his IPad.  Pt. continues to work on improving BUE functioning, ROM, strength, motor control, FMC skill,  and visual compensatory strategies to be able to independently pull up, and release blankets/covers, improve writing legibility, improve self-care including oral care, and utensil use during self-feeding tasks, and maximize independence with ADLs, and IADLs.     PERFORMANCE DEFICITS in functional skills including ADLs, IADLs, coordination, dexterity, proprioception, ROM, strength, pain, FMC, GMC, decreased knowledge of use of DME, and UE functional use, cognitive skills including safety awareness, and psychosocial skills including coping strategies, environmental adaptation, habits, and routines and behaviors.   IMPAIRMENTS are limiting patient from ADLs, IADLs, leisure, and social participation.   COMORBIDITIES may have co-morbidities  that affects occupational performance. Patient will benefit from skilled OT to address above impairments and improve overall function.  MODIFICATION OR ASSISTANCE TO COMPLETE EVALUATION: Maximum or significant modification of tasks or assist is necessary to complete an evaluation.  OT OCCUPATIONAL PROFILE AND HISTORY: Comprehensive assessment: Review of records and extensive additional review of physical, cognitive, psychosocial history related to current functional performance.  CLINICAL DECISION MAKING: High - multiple treatment options, significant modification of task necessary  REHAB  POTENTIAL: Fair    EVALUATION COMPLEXITY: High    PLAN: OT FREQUENCY: 2 x's a week  OT DURATION:12 weeks  PLANNED INTERVENTIONS: self care/ADL training, therapeutic exercise, therapeutic activity, neuromuscular re-education, manual therapy, passive range of motion, patient/family education, and cognitive remediation/compensation  RECOMMENDED OTHER SERVICES: PT  CONSULTED AND AGREED WITH PLAN OF CARE: Patient and family member/caregiver  PLAN FOR NEXT SESSION: Initiate treatment  Olegario Messier, MS, OTR/L  08/15/2022

## 2022-08-16 NOTE — Therapy (Signed)
OUTPATIENT PHYSICAL THERAPY TREATMENT NOTE Patient Name: Paul Hall MRN: [id number] DOB:November 17, 1995, 27 y.o., male Today's Date: 08/16/2022  PCP:  Cindi Carbon PROVIDER:  Lenise Herald, PA-C   PT End of Session - 08/15/22 1706     Visit Number 102    Number of Visits 114    Date for PT Re-Evaluation 08/29/22    Authorization Type [employer] and [employer]- Need auth past 12th visit    Authorization Time Period 01/04/21-03/29/21; Recert 03/24/2021-06/16/2021; Recert 09/15/2021- 12/08/2021; Recert 12/13/2021-03/07/2022    Progress Note Due on Visit 110    PT Start Time 1603    PT Stop Time 1645    PT Time Calculation (min) 42 min    Equipment Utilized During Treatment Gait belt   Hoyer lift   Activity Tolerance Patient tolerated treatment well   queasiness/nausea   Behavior During Therapy WFL for tasks assessed/performed                            Past Medical History:  Diagnosis Date   Diabetes mellitus (HCC)    Hypertension    Stroke (HCC)    Past Surgical History:  Procedure Laterality Date   IVC FILTER PLACEMENT ([facility] HX)     LEG SURGERY     PEG TUBE PLACEMENT     TRACHEOSTOMY     Patient Active Problem List   Diagnosis Date Noted   Sepsis due to vancomycin resistant Enterococcus species (HCC) 06/06/2019   SIRS (systemic inflammatory response syndrome) (HCC) 06/05/2019   Acute lower UTI 06/05/2019   VRE (vancomycin-resistant Enterococci) infection 06/05/2019   Anemia 06/05/2019   Skin ulcer of sacrum with necrosis of muscle (HCC)    Urinary retention    Type 2 diabetes mellitus without complication, with long-term current use of insulin (HCC)    Tachycardia    Lower extremity edema    Acute metabolic encephalopathy    Obstructive sleep apnea    Morbid obesity with BMI of 60.0-69.9, adult (HCC)    Goals of care, counseling/discussion    Palliative care encounter    Sepsis (HCC) 04/27/2019   H/O insulin dependent diabetes mellitus  04/27/2019   History of CVA with residual deficit 04/27/2019   Seizure disorder (HCC) 04/27/2019   Decubitus ulcer of sacral region, stage 4 (HCC) 04/27/2019    REFERRING DIAG: Cerebral infarction, unspecified   THERAPY DIAG:  Muscle weakness (generalized)  Other lack of coordination  History of CVA with residual deficit  Difficulty in walking, not elsewhere classified  Rationale for Evaluation and Treatment Rehabilitation  PERTINENT HISTORY: Paul Hall is a 25yoM who presents with severe weakness, quadriparesis, altered sensorium, and visual impairment s/p critical illness and prolonged hospitalization. Pt hospitalized in October 2020 with ARDS 2/2 COVID19 infection. Pt sustained a complex and lengthy hospitalization which included tracheostomy, prolonged sedation, ECMO. In this period pt sustained CVA and SDH. Pt has now been liberated from tracheostomy and G-tube. Pt has since been hospitalized for wound infection and UTI. Pt lives with parents at home, has hospital bed and left chair, hoyer lift transfers, and power WC for mobility needs. Pt needs heavy physical assistance with ADL 2/2 BUE contractures and motor dysfunction   PRECAUTIONS: Fall  SUBJECTIVE: Patient reports feeling good and having a good week.   PAIN:   Are you having pain? No   TODAY'S TREATMENT:  -  *Patient dependently hoyered from power wheelchair  to edge of mat with assist of  his mother and Chartered loss adjuster.    Therapeutic Exercises: Sitting at edge of mat while performing the following:   Static sit with UE reaching for Edge of mat Trunk exercises- Trunk rotation- Lateral x 20 reps each direction Abdominal crunch into lumbar ext- x 20 reps    Cross body reaching with elbow flex /ext- reaching each arm out x 15 reps each side    Seated hip flex- AROM 20 reps each today.   Seated calf raises x 12 reps on each LE    Knee ext AROM x 20 reps each LE - able to clear the foot   Active hip abd- add-  lifting leg up and over to side and back- 20 reps each LE       Pt educated throughout session about proper posture and technique with exercises. Improved exercise technique, movement at target joints, use of target muscles after min to mod verbal, visual, tactile cues.       PATIENT EDUCATION: Education details: standing posture and exercise technique Person educated: Patient Education method: Explanation, Demonstration, Tactile cues, and Verbal cues Education comprehension: verbalized understanding, returned demonstration, verbal cues required, tactile cues required, and needs further education   HOME EXERCISE PROGRAM:  Access Code: [email or web address] URL: [email or web address] Date: 03/23/2022 Prepared by: Maureen Ralphs  Exercises - Supine Bridge  - 3 x weekly - 3 sets - 10 reps - 2 hold - Supine Gluteal Sets  - 3 x weekly - 3 sets - 10 reps - 5 sec hold - Supine Quad Set  - 1 x daily - 3 x weekly - 3 sets - 10 reps - 5 hold - Seated Long Arc Quad  - 1 x daily - 7 x weekly - 3 sets - 10 reps - Seated Hip Adduction Squeeze with Ball  - 1 x daily - 3 x weekly - 3 sets - 10 reps - 5 hold - Seated Hip Abduction  - 1 x daily - 3 x weekly - 3 sets - 10 reps - 2 hold    PT Short Term Goals -       PT SHORT TERM GOAL #1   Title Pt will be independent with HEP in order to improve strength and balance in order to decrease fall risk and improve function at home and work.    Baseline 01/04/2021= No formal HEP in place; 12/12 no HEP in place; 05/10/2021-Patient and his father were able to report compliance with curent HEP consisting of mostly seated/reclined LE strengthening. Both verbalize no questions at this time.    Time 6    Period Weeks    Status Achieved    Target Date 02/15/21              PT Long Term Goals - Date for completion: 06/01/2022         PT LONG TERM GOAL #1   Title Patient will increase BLE gross strength by 1/2 muscle grade to improve  functional strength for improved independence with potential gait, increased standing tolerance and increased ADL ability.    Baseline 01/04/2021- Patient presents with 1/5 to 3-/5 B LE strength with MMT; 12/12: goal partially met for Left knee/hip; 05/10/2021= 2-/5 bilateal Hip flex; 3+/5 bilateral Knee ext; 06/21/2021= Patient presents with 2-/5 bilateral Hip flex; 3+/5 bilateral knee ext/flex; 2-/5 left ankle DF; 0/5 right ankle- and able to increase reps and resistance with LE's. 09/15/2021- Patient technically presents with 2-/5 B hip flex/abd/add - but he is able to raise his  hip up to approx 100 deg which has improved. 3+/5 Bilateral knee ext, 2-/5 left ankle and 0/5 right ankle.  12/08/2021= Patient able to lift left knee at 110 deg of hip flex; presents with 3+/5 knee ext, 2-/5 left ankle DF and 0/5 right ankle DF, 2-/5 bilateral Hip abd in seated position.   12/6: R: knee 3+/5 ext, 2/5 flexion, left knee 3+/5 extension, 3+/5 flexion, R hip: 2+/5 hip add, 2+/5 hip ABD L hip: 4-/5 hip ABD, 3+/5 hip ADD, 3+/5 hip flexion; 06/06/2022= Patient now presents with 2-/5 right ankle DF/PF;    Time 12    Period Weeks    Status MET   Target Date 03/07/2022     PT LONG TERM GOAL #2   Title Patient will tolerate sitting unsupported demonstrating erect sitting posture for 15 minutes with CGA to demonstrate improved back extensor strength and improved sitting tolerance.    Baseline 01/04/2021- Patient confied to sitting in lift chair or electric power chair with back support and unable to sit upright without physical assistance; 12/12: tolerates <1 minutes upright unsupported sitting. 05/10/2021=static sit with forward trunk lean  in his power wheelchair without back support x approx 3 min. 06/21/2021=Unable to assess today due to patient with acute back pain but on previous visit able to sit x 8 min without back support. 09/15/2021- on last visit- 09/13/2021- patient was able to sit unsupported x 8 min at edge of mat.  10/13/2023 - Patient was able to sit at edge of mat with varying level of assist today from SBA to min A for a total of 20 min. 12/13/2021= Patient demonstrated unsupported sitting at edge of mat for approx 20 min   Time 12    Period Weeks    Status GOAL MET   Target Date 12/08/21      PT LONG TERM GOAL #3   Title Patient will demonstrate ability to perform static standing in // bars > 2 min with Max Assist  without loss of balance and fair posture for improved overall strength for pre-gait and transfer activities.    Baseline 01/04/2021= Patient current uanble to stand- Dependent on hoyer or sit to stand lift for transfers. 05/10/2021=Not appropriate yet- Currently still dependent with all transfers using hoyer. 06/21/2021= Patient continuing now to focus on LE strengthening to prepare for standing-unable to try today due to acute low back pain-  planning on attempting in new cert period. 09/15/2021- Patient has attempted standing 2x in past two week- max Assist of 2 people - only once was he successful to clearing his bottom from chair - Will continue to be a focus during the new certification. 12/13/2021= Patient has been limited secondary to increased overall low back pain during this certification and will require more time to focus on this goal.  12/6: not assessed this date, will assess at date when 2-3 PTs are present for assistance    Time 12    Period Weeks    Status GOAL not appropriate at this time - may attempt in future once Patient presents with improved overall LE strength.          PT LONG TERM GOAL #4   Title Pt will improve FOTO score by 10 points or more demonstrating improved perceived functional ability    Baseline FOTO 7 on 10/17; 03/15/21: FOTO 12; 05/10/2021 06/21/2021= 1; 09/15/2021= 9; 12/13/2021= Will issue next visit 12/6: 4; 06/06/2022= Will assess next visit   Time 12    Period Weeks  Status ONGOING   Target date 08/29/2022     PT LONG TERM GOAL #5   Title Patient will  perform sit to stand transfer with appropriate AD and max assist of 2 people with 75% consistency to prepare for pregait activities.    Baseline 09/15/2021= Patient unable to stand well- unable to clear his bottom off chair with Max assist of 2 persons. 12/13/2021- Goal not appropriate to try yet but will keep and roll over to next cert as shift continues to focus on transfers/standing; 4/24= Patient able to perform active ankle DF/PF with right LE and able to raise his knee into seated march and clear floor without physical assist today - as previously unable as well as lift right knee ext to near full ROM to improve strength for eventual standing.    Time 12    Period Weeks    Status Goal still not appropriate for now but will keep active for future   Target date    PT LONG TERM GOAL #6  Title Patient will tolerate sitting unsupported demonstrating erect sitting posture for 30 minutes with CGA to demonstrate improved back extensor strength and improved sitting tolerance.   Baseline 12/13/2021= Patient demonstrated unsupported sitting at edge of mat for approx 20 min;  12/29/2021- Patient performed approx 30 min of dynamic sitting activities today. 06/06/2022= Patient demonstrated ability to sit and perform static and dynamic UE/LE movement with only Supervision.   Time 12   Period Weeks   Status GOAL MET         7.  Patient will tolerate 5 minutes or more of standing in sit to stand lift in order to indicate improved lower extremity weightbearing tolerance for progression to standing in parallel bars. Baseline: 1 minute on most recent stand 02/21/22; 06/06/2022- Patient did attempt today after complaining of right LE pain last week. He attempted 3 stands using sit to stand lift. - all over 1 min- last one approx 48 sec- stopped due to fatigue. More erect standing in lift today - still poor gluteal strength but able to activate glutes and extend hips upon command but unable to hold > 5 sec. 07/28/2022-  Will attempt standing next visit but patient has been able to stand since last progress note for up to 3 min at a time at his best.  Goal status: ONGOING- Progressing Target date: 08/29/2022  8. Patient will tolerate sitting unsupported demonstrating ability to perform dynamic UE/LE activities for 30 minutes  independently to demonstrate ability to sit at edge of bed to eat or perform some ADL's/exercise for optimal quality of life.  Baseline: 06/06/2022- Patient able to sit and perform some UE/LE exercises but requires CGA at times for safety- he is able to static sit for 30 min with supervision. 07/27/2022= Patient able to now sit > 30 min with Supervision only - performing static and dynamic activities. Will keep goal active to ensure patient is consistent.   Goal Status: PROGRESSING  Target date: 08/29/2022       Plan     Clinical Impression Statement Patient continues to demo progress- able to move Right LE with more active ROM and able to ant/post weight shift better with more confidence. He was able to lift his leg up and over on his own minimally yet without physical assist today.   Pt will continue to benefit from skilled physical therapy intervention to address impairments, improve QOL, and attain therapy goals.    Personal Factors and Comorbidities Comorbidity  3+;Time since onset of injury/illness/exacerbation    Comorbidities CVA, diabetes, Seizures    Examination-Activity Limitations Bathing;Bed Mobility;Bend;Caring for Others;Carry;Dressing;Hygiene/Grooming;Lift;Locomotion Level;Reach Overhead;Self Feeding;Sit;Squat;Stairs;Stand;Transfers;Toileting    Examination-Participation Restrictions Cleaning;Community Activity;Driving;Laundry;Medication Management;Meal Prep;Occupation;Personal Finances;Shop;Yard Work;Volunteer    Stability/Clinical Decision Making Evolving/Moderate complexity    Rehab Potential Fair    PT Frequency 2x / week    PT Duration 12 weeks    PT Treatment/Interventions  ADLs/Self Care Home Management;Cryotherapy;Electrical Stimulation;Moist Heat;Ultrasound;DME Instruction;Gait training;Stair training;Functional mobility training;Therapeutic exercise;Balance training;Patient/family education;Orthotic Fit/Training;Neuromuscular re-education;Wheelchair mobility training;Manual techniques;Passive range of motion;Dry needling;Energy conservation;Taping;Visual/perceptual remediation/compensation;Joint Manipulations    PT Next Visit Plan core strength- sitting at EOM, postural control, sit to stand if able; Continue with progressive LE Strengthening.    PT Home Exercise Plan No changes to HEP today    Consulted and Agree with Plan of Care Patient;Family member/caregiver    Family Member Consulted Mom             Louis Meckel, PT Physical Therapist- [facility]  [facility]  08/16/2022, 5:16 PM

## 2022-08-17 ENCOUNTER — Ambulatory Visit: Payer: BC Managed Care – PPO

## 2022-08-17 ENCOUNTER — Ambulatory Visit: Payer: BC Managed Care – PPO | Admitting: Occupational Therapy

## 2022-08-22 ENCOUNTER — Ambulatory Visit: Payer: BC Managed Care – PPO | Admitting: Occupational Therapy

## 2022-08-22 ENCOUNTER — Ambulatory Visit: Payer: BC Managed Care – PPO

## 2022-08-22 DIAGNOSIS — I6389 Other cerebral infarction: Secondary | ICD-10-CM | POA: Diagnosis not present

## 2022-08-22 DIAGNOSIS — R32 Unspecified urinary incontinence: Secondary | ICD-10-CM | POA: Diagnosis not present

## 2022-08-22 DIAGNOSIS — L8994 Pressure ulcer of unspecified site, stage 4: Secondary | ICD-10-CM | POA: Diagnosis not present

## 2022-08-24 ENCOUNTER — Ambulatory Visit: Payer: BC Managed Care – PPO

## 2022-08-24 ENCOUNTER — Ambulatory Visit: Payer: BC Managed Care – PPO | Admitting: Occupational Therapy

## 2022-08-24 DIAGNOSIS — R262 Difficulty in walking, not elsewhere classified: Secondary | ICD-10-CM | POA: Diagnosis not present

## 2022-08-24 DIAGNOSIS — I6930 Unspecified sequelae of cerebral infarction: Secondary | ICD-10-CM | POA: Diagnosis not present

## 2022-08-24 DIAGNOSIS — R278 Other lack of coordination: Secondary | ICD-10-CM

## 2022-08-24 DIAGNOSIS — R2681 Unsteadiness on feet: Secondary | ICD-10-CM | POA: Diagnosis not present

## 2022-08-24 DIAGNOSIS — M6281 Muscle weakness (generalized): Secondary | ICD-10-CM

## 2022-08-24 NOTE — Therapy (Signed)
OUTPATIENT PHYSICAL THERAPY TREATMENT NOTE Patient Name: Paul Hall MRN: [id number] DOB:05/21/1995, 26 y.o., male Today's Date: 08/16/2022  PCP:  REFERRING PROVIDER:  Benjamin Mann, PA-C   PT End of Session - 08/15/22 1706     Visit Number 102    Number of Visits 114    Date for PT Re-Evaluation 08/29/22    Authorization Type [employer] and [employer]- Need auth past 12th visit    Authorization Time Period 01/04/21-03/29/21; Recert 03/24/2021-06/16/2021; Recert 09/15/2021- 12/08/2021; Recert 12/13/2021-03/07/2022    Progress Note Due on Visit 110    PT Start Time 1603    PT Stop Time 1645    PT Time Calculation (min) 42 min    Equipment Utilized During Treatment Gait belt   Hoyer lift   Activity Tolerance Patient tolerated treatment well   queasiness/nausea   Behavior During Therapy WFL for tasks assessed/performed                            Past Medical History:  Diagnosis Date   Diabetes mellitus (HCC)    Hypertension    Stroke (HCC)    Past Surgical History:  Procedure Laterality Date   IVC FILTER PLACEMENT ([facility] HX)     LEG SURGERY     PEG TUBE PLACEMENT     TRACHEOSTOMY     Patient Active Problem List   Diagnosis Date Noted   Sepsis due to vancomycin resistant Enterococcus species (HCC) 06/06/2019   SIRS (systemic inflammatory response syndrome) (HCC) 06/05/2019   Acute lower UTI 06/05/2019   VRE (vancomycin-resistant Enterococci) infection 06/05/2019   Anemia 06/05/2019   Skin ulcer of sacrum with necrosis of muscle (HCC)    Urinary retention    Type 2 diabetes mellitus without complication, with long-term current use of insulin (HCC)    Tachycardia    Lower extremity edema    Acute metabolic encephalopathy    Obstructive sleep apnea    Morbid obesity with BMI of 60.0-69.9, adult (HCC)    Goals of care, counseling/discussion    Palliative care encounter    Sepsis (HCC) 04/27/2019   H/O insulin dependent diabetes mellitus  04/27/2019   History of CVA with residual deficit 04/27/2019   Seizure disorder (HCC) 04/27/2019   Decubitus ulcer of sacral region, stage 4 (HCC) 04/27/2019    REFERRING DIAG: Cerebral infarction, unspecified   THERAPY DIAG:  Muscle weakness (generalized)  Other lack of coordination  History of CVA with residual deficit  Difficulty in walking, not elsewhere classified  Rationale for Evaluation and Treatment Rehabilitation  PERTINENT HISTORY: Paul Hall is a 25yoM who presents with severe weakness, quadriparesis, altered sensorium, and visual impairment s/p critical illness and prolonged hospitalization. Pt hospitalized in October 2020 with ARDS 2/2 COVID19 infection. Pt sustained a complex and lengthy hospitalization which included tracheostomy, prolonged sedation, ECMO. In this period pt sustained CVA and SDH. Pt has now been liberated from tracheostomy and G-tube. Pt has since been hospitalized for wound infection and UTI. Pt lives with parents at home, has hospital bed and left chair, hoyer lift transfers, and power WC for mobility needs. Pt needs heavy physical assistance with ADL 2/2 BUE contractures and motor dysfunction   PRECAUTIONS: Fall  SUBJECTIVE: Patient reports feeling good and having a good week.   PAIN:   Are you having pain? No   TODAY'S TREATMENT:  -  *Patient dependently hoyered from power wheelchair  to edge of mat with assist of   his mother and author.    Therapeutic Exercises: Sitting at edge of mat while performing the following:   Static sit with UE reaching for Edge of mat Trunk exercises- Trunk rotation- Lateral x 20 reps each direction Abdominal crunch into lumbar ext- x 20 reps    Cross body reaching with elbow flex /ext- reaching each arm out x 15 reps each side    Seated hip flex- AROM 20 reps each today.   Seated calf raises x 12 reps on each LE    Knee ext AROM x 20 reps each LE - able to clear the foot   Active hip abd- add-  lifting leg up and over to side and back- 20 reps each LE       Pt educated throughout session about proper posture and technique with exercises. Improved exercise technique, movement at target joints, use of target muscles after min to mod verbal, visual, tactile cues.       PATIENT EDUCATION: Education details: standing posture and exercise technique Person educated: Patient Education method: Explanation, Demonstration, Tactile cues, and Verbal cues Education comprehension: verbalized understanding, returned demonstration, verbal cues required, tactile cues required, and needs further education   HOME EXERCISE PROGRAM:  Access Code: [email or web address] URL: [email or web address] Date: 03/23/2022 Prepared by: Brysun Eschmann  Exercises - Supine Bridge  - 3 x weekly - 3 sets - 10 reps - 2 hold - Supine Gluteal Sets  - 3 x weekly - 3 sets - 10 reps - 5 sec hold - Supine Quad Set  - 1 x daily - 3 x weekly - 3 sets - 10 reps - 5 hold - Seated Long Arc Quad  - 1 x daily - 7 x weekly - 3 sets - 10 reps - Seated Hip Adduction Squeeze with Ball  - 1 x daily - 3 x weekly - 3 sets - 10 reps - 5 hold - Seated Hip Abduction  - 1 x daily - 3 x weekly - 3 sets - 10 reps - 2 hold    PT Short Term Goals -       PT SHORT TERM GOAL #1   Title Pt will be independent with HEP in order to improve strength and balance in order to decrease fall risk and improve function at home and work.    Baseline 01/04/2021= No formal HEP in place; 12/12 no HEP in place; 05/10/2021-Patient and his father were able to report compliance with curent HEP consisting of mostly seated/reclined LE strengthening. Both verbalize no questions at this time.    Time 6    Period Weeks    Status Achieved    Target Date 02/15/21              PT Long Term Goals - Date for completion: 06/01/2022         PT LONG TERM GOAL #1   Title Patient will increase BLE gross strength by 1/2 muscle grade to improve  functional strength for improved independence with potential gait, increased standing tolerance and increased ADL ability.    Baseline 01/04/2021- Patient presents with 1/5 to 3-/5 B LE strength with MMT; 12/12: goal partially met for Left knee/hip; 05/10/2021= 2-/5 bilateal Hip flex; 3+/5 bilateral Knee ext; 06/21/2021= Patient presents with 2-/5 bilateral Hip flex; 3+/5 bilateral knee ext/flex; 2-/5 left ankle DF; 0/5 right ankle- and able to increase reps and resistance with LE's. 09/15/2021- Patient technically presents with 2-/5 B hip flex/abd/add - but he is able to raise his   hip up to approx 100 deg which has improved. 3+/5 Bilateral knee ext, 2-/5 left ankle and 0/5 right ankle.  12/08/2021= Patient able to lift left knee at 110 deg of hip flex; presents with 3+/5 knee ext, 2-/5 left ankle DF and 0/5 right ankle DF, 2-/5 bilateral Hip abd in seated position.   12/6: R: knee 3+/5 ext, 2/5 flexion, left knee 3+/5 extension, 3+/5 flexion, R hip: 2+/5 hip add, 2+/5 hip ABD L hip: 4-/5 hip ABD, 3+/5 hip ADD, 3+/5 hip flexion; 06/06/2022= Patient now presents with 2-/5 right ankle DF/PF;    Time 12    Period Weeks    Status MET   Target Date 03/07/2022     PT LONG TERM GOAL #2   Title Patient will tolerate sitting unsupported demonstrating erect sitting posture for 15 minutes with CGA to demonstrate improved back extensor strength and improved sitting tolerance.    Baseline 01/04/2021- Patient confied to sitting in lift chair or electric power chair with back support and unable to sit upright without physical assistance; 12/12: tolerates <1 minutes upright unsupported sitting. 05/10/2021=static sit with forward trunk lean  in his power wheelchair without back support x approx 3 min. 06/21/2021=Unable to assess today due to patient with acute back pain but on previous visit able to sit x 8 min without back support. 09/15/2021- on last visit- 09/13/2021- patient was able to sit unsupported x 8 min at edge of mat.  10/13/2023 - Patient was able to sit at edge of mat with varying level of assist today from SBA to min A for a total of 20 min. 12/13/2021= Patient demonstrated unsupported sitting at edge of mat for approx 20 min   Time 12    Period Weeks    Status GOAL MET   Target Date 12/08/21      PT LONG TERM GOAL #3   Title Patient will demonstrate ability to perform static standing in // bars > 2 min with Max Assist  without loss of balance and fair posture for improved overall strength for pre-gait and transfer activities.    Baseline 01/04/2021= Patient current uanble to stand- Dependent on hoyer or sit to stand lift for transfers. 05/10/2021=Not appropriate yet- Currently still dependent with all transfers using hoyer. 06/21/2021= Patient continuing now to focus on LE strengthening to prepare for standing-unable to try today due to acute low back pain-  planning on attempting in new cert period. 09/15/2021- Patient has attempted standing 2x in past two week- max Assist of 2 people - only once was he successful to clearing his bottom from chair - Will continue to be a focus during the new certification. 12/13/2021= Patient has been limited secondary to increased overall low back pain during this certification and will require more time to focus on this goal.  12/6: not assessed this date, will assess at date when 2-3 PTs are present for assistance    Time 12    Period Weeks    Status GOAL not appropriate at this time - may attempt in future once Patient presents with improved overall LE strength.          PT LONG TERM GOAL #4   Title Pt will improve FOTO score by 10 points or more demonstrating improved perceived functional ability    Baseline FOTO 7 on 10/17; 03/15/21: FOTO 12; 05/10/2021 06/21/2021= 1; 09/15/2021= 9; 12/13/2021= Will issue next visit 12/6: 4; 06/06/2022= Will assess next visit   Time 12    Period Weeks      Status ONGOING   Target date 08/29/2022     PT LONG TERM GOAL #5   Title Patient will  perform sit to stand transfer with appropriate AD and max assist of 2 people with 75% consistency to prepare for pregait activities.    Baseline 09/15/2021= Patient unable to stand well- unable to clear his bottom off chair with Max assist of 2 persons. 12/13/2021- Goal not appropriate to try yet but will keep and roll over to next cert as shift continues to focus on transfers/standing; 4/24= Patient able to perform active ankle DF/PF with right LE and able to raise his knee into seated march and clear floor without physical assist today - as previously unable as well as lift right knee ext to near full ROM to improve strength for eventual standing.    Time 12    Period Weeks    Status Goal still not appropriate for now but will keep active for future   Target date    PT LONG TERM GOAL #6  Title Patient will tolerate sitting unsupported demonstrating erect sitting posture for 30 minutes with CGA to demonstrate improved back extensor strength and improved sitting tolerance.   Baseline 12/13/2021= Patient demonstrated unsupported sitting at edge of mat for approx 20 min;  12/29/2021- Patient performed approx 30 min of dynamic sitting activities today. 06/06/2022= Patient demonstrated ability to sit and perform static and dynamic UE/LE movement with only Supervision.   Time 12   Period Weeks   Status GOAL MET         7.  Patient will tolerate 5 minutes or more of standing in sit to stand lift in order to indicate improved lower extremity weightbearing tolerance for progression to standing in parallel bars. Baseline: 1 minute on most recent stand 02/21/22; 06/06/2022- Patient did attempt today after complaining of right LE pain last week. He attempted 3 stands using sit to stand lift. - all over 1 min- last one approx 1min 48 sec- stopped due to fatigue. More erect standing in lift today - still poor gluteal strength but able to activate glutes and extend hips upon command but unable to hold > 5 sec. 07/28/2022-  Will attempt standing next visit but patient has been able to stand since last progress note for up to 3 min at a time at his best.  Goal status: ONGOING- Progressing Target date: 08/29/2022  8. Patient will tolerate sitting unsupported demonstrating ability to perform dynamic UE/LE activities for 30 minutes  independently to demonstrate ability to sit at edge of bed to eat or perform some ADL's/exercise for optimal quality of life.  Baseline: 06/06/2022- Patient able to sit and perform some UE/LE exercises but requires CGA at times for safety- he is able to static sit for 30 min with supervision. 07/27/2022= Patient able to now sit > 30 min with Supervision only - performing static and dynamic activities. Will keep goal active to ensure patient is consistent.   Goal Status: PROGRESSING  Target date: 08/29/2022       Plan     Clinical Impression Statement Patient continues to demo progress- able to move Right LE with more active ROM and able to ant/post weight shift better with more confidence. He was able to lift his leg up and over on his own minimally yet without physical assist today.   Pt will continue to benefit from skilled physical therapy intervention to address impairments, improve QOL, and attain therapy goals.    Personal Factors and Comorbidities Comorbidity   3+;Time since onset of injury/illness/exacerbation    Comorbidities CVA, diabetes, Seizures    Examination-Activity Limitations Bathing;Bed Mobility;Bend;Caring for Others;Carry;Dressing;Hygiene/Grooming;Lift;Locomotion Level;Reach Overhead;Self Feeding;Sit;Squat;Stairs;Stand;Transfers;Toileting    Examination-Participation Restrictions Cleaning;Community Activity;Driving;Laundry;Medication Management;Meal Prep;Occupation;Personal Finances;Shop;Yard Work;Volunteer    Stability/Clinical Decision Making Evolving/Moderate complexity    Rehab Potential Fair    PT Frequency 2x / week    PT Duration 12 weeks    PT Treatment/Interventions  ADLs/Self Care Home Management;Cryotherapy;Electrical Stimulation;Moist Heat;Ultrasound;DME Instruction;Gait training;Stair training;Functional mobility training;Therapeutic exercise;Balance training;Patient/family education;Orthotic Fit/Training;Neuromuscular re-education;Wheelchair mobility training;Manual techniques;Passive range of motion;Dry needling;Energy conservation;Taping;Visual/perceptual remediation/compensation;Joint Manipulations    PT Next Visit Plan core strength- sitting at EOM, postural control, sit to stand if able; Continue with progressive LE Strengthening.    PT Home Exercise Plan No changes to HEP today    Consulted and Agree with Plan of Care Patient;Family member/caregiver    Family Member Consulted Mom             Jeff Tytan Sandate, PT Physical Therapist- [place]  [facility]  08/16/2022, 5:16 PM 

## 2022-08-25 NOTE — Therapy (Signed)
Occupational Therapy Neuro Treatment Note  Patient Name: Paul Hall MRN: [id number] DOB:Apr 17, 1995, 27 y.o., male Today's Date: 08/25/2022  PCP: Dr. Sherwood Gambler REFERRING PROVIDER: Dr. Sherwood Gambler   OT End of Session - 08/25/22 1210     Visit Number 35    Number of Visits 60    Date for OT Re-Evaluation 11/07/22    OT Start Time 1650    OT Stop Time 1735    OT Time Calculation (min) 45 min    Activity Tolerance Patient tolerated treatment well    Behavior During Therapy WFL for tasks assessed/performed                     Past Medical History:  Diagnosis Date   Diabetes mellitus (HCC)    Hypertension    Stroke [facility])    Past Surgical History:  Procedure Laterality Date   IVC FILTER PLACEMENT ([facility] HX)     LEG SURGERY     PEG TUBE PLACEMENT     TRACHEOSTOMY     Patient Active Problem List   Diagnosis Date Noted   Sepsis due to vancomycin resistant Enterococcus species (HCC) 06/06/2019   SIRS (systemic inflammatory response syndrome) (HCC) 06/05/2019   Acute lower UTI 06/05/2019   VRE (vancomycin-resistant Enterococci) infection 06/05/2019   Anemia 06/05/2019   Skin ulcer of sacrum with necrosis of muscle (HCC)    Urinary retention    Type 2 diabetes mellitus without complication, with long-term current use of insulin (HCC)    Tachycardia    Lower extremity edema    Acute metabolic encephalopathy    Obstructive sleep apnea    Morbid obesity with BMI of 60.0-69.9, adult (HCC)    Goals of care, counseling/discussion    Palliative care encounter    Sepsis (HCC) 04/27/2019   H/O insulin dependent diabetes mellitus 04/27/2019   History of CVA with residual deficit 04/27/2019   Seizure disorder (HCC) 04/27/2019   Decubitus ulcer of sacral region, stage 4 (HCC) 04/27/2019    ONSET DATE: 01/2019  REFERRING DIAG: CVA/COVID-19  THERAPY DIAG:  Muscle weakness (generalized)  Rationale for Evaluation and Treatment Rehabilitation  SUBJECTIVE:   SUBJECTIVE  STATEMENT:  Pt. Reports  doing well today  Pt accompanied by: self and family member  PERTINENT HISTORY:  Pt. is a 27 y.o. male who was diagnosed with COVID-19, and CVA with resultant quadriplegia. Pt. Was then hospitalized with VRE UTI. PMHx includes: urinary retention, seizure disorder, obstructive sleep disorder, DM Type II, Morbid obesity.   PRECAUTIONS: None  WEIGHT BEARING RESTRICTIONS No  PAIN:  Are you having pain? No  FALLS: Has patient fallen in last 6 months? No  LIVING ENVIRONMENT: Lives with: lives with their family Lives in: House/apartment Stairs: No Level Entry Has following equipment at home: Wheelchair (power) and hoyer lift, sit to stand lift  PLOF: Independent  PATIENT GOALS: To be able to engage in more daily care tasks.  OBJECTIVE:   HAND DOMINANCE: Right  ADLs:  Transfers/ambulation related to ADLs: Eating: Pt. reports being able to hold standard utensils, and is starting to engage more in self-feeding tasks, hand to mouth patterns. Pt. Reports that he does as much as he can with the task, and family assists  with the remainder of the task. Grooming: Pt. Is able to initiate holding an electric toothbrush, and brush his teeth. Family assists LB Dressing: Total Assist UE dressing: Pt. is now able to reach up to actively assist with grasping , and pulling  his gown down. Toileting:  Total Assist Bathing: MaxA UB, Total assist LB Tub Shower transfers: N/A Equipment: See above    IADLs: Shopping: Relies on family to assist Light housekeeping: Total Assist Meal Prep: Total Assist Community mobility:   Medication management:  Total Assist  Financial management: N/A Handwriting: Not legible: Pt. Is able to hold a pen with the left hand, and initiate marking the page. Pt.'s eye glasses were not available  MOBILITY STATUS:  Power w/c  POSTURE COMMENTS:  Pt. Requires position changes in his power w/c  ACTIVITY TOLERANCE: Activity tolerance:   Fair  FUNCTIONAL OUTCOME MEASURES: FOTO: 40  TR score: 45  05/25/2022:   FOTO: 50 TR score: 45  07/20/22: FOTO 55  UPPER EXTREMITY ROM     Active ROM Right eval Right 03/21/2022 Right 05/25/2022 R  07/20/22 Right  07/25/22 Right 08/15/2022 Left eval Left  03/21/2022 Left 05/25/2022 L  07/20/22 Left 07/25/2022 Left  08/15/2022  Shoulder flexion 106 scaption 105  Scaption 62 flexion 110 scaption 100  105 119 123 125 130  136  Shoulder abduction 114 90 97 100  105 110 115 115 115  118  Shoulder adduction              Shoulder extension              Shoulder internal rotation              Shoulder external rotation              Elbow flexion 120(130) 145 145 140  144 135(135) 145 145   145  Elbow extension -45(-35) -22(-35) -28(-22)  -32(-21) -32(-28) -27(-18) -21(-20) -20(-16)  -25(-10) -23(-10)  Wrist flexion              Wrist extension -30(10) -25(10) -10(30)  -10(20) -10(25) 10(50) 19(50) 28(50)  30(50) 35(55)  Wrist ulnar deviation              Wrist radial deviation              Wrist pronation              Wrist supination   Limited by flexor tone      Limited by flexor tone     (Blank rows = not tested)      UPPER EXTREMITY MMT:     Right eval Right 03/21/2022 Right  05/25/2022 R 07/20/22 Right 08/25/2022 Left eval Left 03/21/2022 Left  05/25/2022 L 07/20/22 Left  08/15/2022  Shoulder flexion 3-/5 scaption 3-/5 3-/5 3+/5 3+/5 3-/5 3/5 3+/5 4+/5 4+/5  Shoulder abduction 3-/5 3-/5 3-/5 4/5 4/5 3-/5 3-/5 4-/5 4+/5 4+/5  Shoulder adduction            Shoulder extension            Shoulder internal rotation            Shoulder external rotation            Elbow flexion 3/5 3/5 3+/5 4/5 4+/5 3/5 3+/5 4-/5 4+/5 5/5  Elbow extension 2-/5 2/5 3-/5 4+/5 4+/5 2-/5 2/5  4+/5 4+/5  Wrist flexion            Wrist extension 2-/5 2-/5 2/5 2/5 2/5 2/5 2+/5 3-/5  3-/5  Wrist ulnar deviation            Wrist radial deviation            Wrist pronation  Wrist  supination            (Blank rows = not tested)     HAND FUNCTION:  Grip strength: Right: 0 lbs; Left: 0 lbs and Lateral pinch: Right: 5 lbs, Left: 2 lbs  03/21/2022: Lateral pinch: Right: 3.5 lbs, Left: 2 lbs  07/20/22: Grip strength: Right: 3 lbs; Left: 4 lbs and Lateral pinch: Right: 5 lbs, Left: 3 lbs  08/15/2022:  Grip strength: Right: 0 lbs; Left: 5 lbs and Lateral pinch: Right: 2 lbs, Left: 4 lbs  Bilateral digit PIP/DIP flexion contractures with MP hyperextension with attempts for AROM. Pt. is able to tolerate AROM to the bilateral digits at the initial evaluation however, has a history of pain in the digits.  COORDINATION:  Eval: Pt. is unable to grasp 9-hole test pegs. Pt. is able to initiate grasping larger pegs, and is able to hold a pen in the left hand.  07/20/22: 2 min 36 seconds to remove 9 pegs from 9 hole peg test - cues to locate pegs 2/2 low vision. Pt. is able to initiate grasping larger pegs on R hand and is able to hold a pen in the left hand.  08/15/2022:      Vision, and sensation limiting accuracy of 9 hole peg test results. Pt. Was able to grasp and remove vertical pegs intermittently with cues.    SENSATION: Light touch: Impaired   EDEMA:  N/A  MUSCLE TONE: BUE flexor Spasticity  COGNITION Overall cognitive status: Continue to assess in functional context  VISION:   Subjective report: Pt. was not wearing glasses at the time of the initial eval.  Baseline vision: Vision is very limited. Wears glasses all the time Visual history: History of impaired vision following CVA. Pt. Has received treatment through the [facility] low vision rehabilitation program.   VISION ASSESSMENT: Impaired To be further assessed in functional context  PERCEPTION: Impaired   PRAXIS: Impaired: motor planning  OBSERVATIONS:  Pt reports being on Tramadol   TODAY'S TREATMENT 08/25/2022   Therapeutic Exercise:   Pt. completed seated BUE therex using boxing speed bag  set at head height; intermittent rest breaks 3# upgraded, and followed by 4#, and 5# wrist weight to L arm. Pt. Required support at the elbow to hit the speed bag pt hit bag consistently alternating L and R hand.  Pt.  Performed BUE strengthening on the [employer]. Pt. Worked on bilateral elbow extension, chest press punch/push outs, and diagonal push presses across body.. Pt. Worked with 2.5# on the right., and 5.5# on the left.(Left: 2.5# Matrix weight plus 3# cuff weight on the left)     PATIENT EDUCATION: Education details: There. Halford Decamp Person educated: Patient and Parent Education method: Explanation, Demonstration, Tactile cues, and Verbal cues Education comprehension: verbalized understanding, returned demonstration, verbal cues required, and needs further education   HOME EXERCISE PROGRAM:   Continue ongoing assessment, and continue to provide as needed.     GOALS: Goals reviewed with patient? Yes  SHORT TERM GOALS: Target date:09/26/2022       To assess splint fit, and make appropriate adjustments to promote good skin integrity through the palmar surface of the bilateral hands.  Baseline: 05/25/22: Goal currently met, however ongoing as needs to assess splint fit arise. 03/23/2022: Pt. is wearing splints a couple of hours at night bilateral resting hand splints. 03/21/2022: Pt. is wearing splints a couple of hours at night bilateral resting hand splints. Goal status: Continue ongoing assessment as requested, and  as needed   LONG TERM GOALS: Target date: 11/07/2022    FOTO score will Improve by 2 points for Pt. perceived improvement with the assessment specific ADL/IADL tasks.  Baseline: 07/20/2022: FOTO 55 05/25/2022: FOTO score: 50,  TR score: 45 Eval: FOTO score: 40,  TR score: 45 Goal status: Achieved   2.   Pt. will independently perform oral care for 100% of the task after complete set-up. Baseline: 08/15/2022: Completes 90% of the task, proximal assist at times  required at the elbow. 07/20/22: completes 90% of task, limited by shoulder flexion. 05/25/2022:  Pt. Is able to initiate and perform oral care for approximately 90% of the task. Complete set-up required. Assi needed only for the very back teeth. 03/23/2022: Pt. Is able to initiate and perform oral care for approximately 75% of the task. Pt. Requires assist at proximally at the elbow for through oral care. 03/21/2022: Pt. Is able to initiate and perform oral care for approximately 75% of the task. Pt. Requires assist at proximally at the elbow for through oral care. Eval: Pt. is able to initiate using an electric toothbrush. Pt. requires assist for set-up, and assist for thoroughness, and as he Pt. fatigues. Goal status: Ongoing  3.  Pt. Will be independent with self-feeding for 100% of the meal after complete set-up Baseline:08/15/2022:  90% using the spoon, assit at time with the forearm motion, and grip on the utensils. 100% for finger foods.  07/20/22: self-feeds cereal using spoon 90% of task. 05/25/2022: Pt. Is able to use a spoon to scoop cereal when feeding himself cereal 85% of the time. Pt. Is able to feed himself snack/finger foods 100% of the time. Pt. Continues to work on consistency of  stabilizing a cup/mug when drinking. Pt. Is able to grasp a water bottle with assist initially, with assist tapering off as he drinks.03/23/2022: Pt. Is able to perform scooping cereal for 75% of the time. Pt. required assist, and support at the left elbow, and Pt. Presents with limited forearm supination when using the spoon, and bringing it towards his mouth. Pt. Is able to use a fork to spear items, and perform the hand to mouth pattern.  03/21/2022: Pt. Is able to perform scooping cereal for 75% of the time. Pt. required assist, and support at the left elbow, and Pt. presents with limited forearm supination when using the spoon, and bringing it towards his mouth. Pt. Is able to use a fork to spear items, and perform  the hand to mouth pattern.  Eval: Pt. is able to hold standard standard utensils. Pt. Performs as much of the task as he, can and has assistance for the remainder. Goal status: Ongoing  4.  Pt. Will improve grasp patterns and consistently grasp 1/4" objects for ADL, and IADL tasks.  Baseline: 08/15/2022: grasps 1/4" pegs with min a + visual cues, consistently grasping 1/2" objects with visual cues. 07/20/22: grasps 1/4" pegs with min a + visual cues, consistently grasping 1/2" objects with visual cues. 05/25/2022: Pt. Is working on improving consistency of grasping 1/2" objects with visual cues.  03/23/2022: Pt. Is able to grasp 1" objects consistently,and continues to work on the hand patterns needed to grasp 1/2" objects.03/21/2022: Pt. Is able to grasp 1" objects consistently,and continues to work on the hand patterns needed to grasp 1/2" objects. Eval: Pt. is able to grasp 1" objects intermittently using a lateral grasp pattern. Goal status: Progressing; goal revised to 1/4" items  5.  Pt. will independently write  his name legibly with letter sizes under 1". Baseline: 08/15/2022: Pt. Is able to write name with smaller letter size. Pt. Is signing name with a computer styus. Pt. Requires visual cues, and assist. 07/20/22: stabilizing assist to write name with 75% legibility with 2" letters. 05/25/2022: Pt. to continue to work towards formulating grasp patterns in preparation for grasping a large width pen. Pt. Requires visual cues. 03/23/2022: Pt. Is able to write his name with modA, however has difficulty with formulating letter sizes less than 2" in size with 50% legibility for the 3 letters of his name.03/21/2022: Pt. Is able to write his name with modA, however has difficulty with formulating letter sizes less than 2" in size with 50% legibility for the 3 letters of his name. Eval: Pt is able to hold a thin marker with his left hand, and formulate a line, and initiate a circular pattern (Pt. without glasses  today) Goal status: Progressing; Goal revised for more control with smaller writing size  6. Pt. Will reach up to comb/brush his hair  with minA.  Baseline: 5/13/202: 75% using a hair pick, assist to the right side of the head. 07/20/22: reaches 75% of head, assist for far R side of head, fatigues quickly. 05/25/2022: Pt. Is able to reach up with the left hand to the left side, top, and back of his head. 03/23/2022: Pt. is now able to more consistently initiate reaching up to his head with his left hand in preparation for haircare12/18/2023: Pt. is now able to more consistently initiate reaching up to his head with his left hand in preparation for haircare. Eval: Pt. is able to initiate reaching up for hair care with a long handled brush, however is unable to sustain UE's in elevation to perform the task.     Goal status: Ongoing  7. Pt. Will independently navigate the w/c through his environment with minA with visual scanning, and hand placement on the controls.  Baseline: 08/15/2022: Pt. Requires minA to setup hand on controls and MIN cues to navigate the w/c in wide spaces, requires MIN - MOD cues to navigate the w/c through more narrow doorways, and tighter turns - varies based on good vs bad vision days.07/20/22: Pt. Requires minA to setup hand on controls and MIN cues to navigate the w/c in wide spaces, requires MIN - MOD cues to navigate the w/c through more narrow doorways, and tighter turns - varies based on good vs bad vision days. 05/25/2022: Pt. Requires minA  and  cues to navigate the w/c in wide spaces, and requires Mod cues to navigate the w/c through more narrow doorways, and tighter turns. Pt. Requires max cues for scanning through the environment, and moderate cues for hand placement on the controls.     Goal status: Ongoing    8. Pt. Will improve bilateral grip strength to be able to independently grasp, and pull up blankets, and linens.      Baseline: 08/15/2022: Pt. Has difficulty securely  holding, and pulling up blankets, and linens.    Goal status: New   9. Pt. Will consistently actively control the releasing  of blankets, covers, and linens from his hands once they are in the desired position over him. Baseline: 08/15/2022: Pt. has difficulty with controlled releasing of blankets/linens from his grasp.     Goal status: New   ASSESSMENT:  CLINICAL IMPRESSION:  Pt. tolerated the BUE exercises well today, and was able to perform the boxing, and Matrix  exercises well with  good technique, form, and pace.  Pt. Education was provided about swivel spoons to assist with spillage when scooping cereal. Pt. continues to work on improving BUE functioning, ROM, strength, motor control, FMC skill, and visual compensatory strategies to be able to independently pull up, and release blankets/covers, improve writing legibility, improve self-care including oral care, and utensil use during self-feeding tasks, and maximize independence with ADLs, and IADLs.     PERFORMANCE DEFICITS in functional skills including ADLs, IADLs, coordination, dexterity, proprioception, ROM, strength, pain, FMC, GMC, decreased knowledge of use of DME, and UE functional use, cognitive skills including safety awareness, and psychosocial skills including coping strategies, environmental adaptation, habits, and routines and behaviors.   IMPAIRMENTS are limiting patient from ADLs, IADLs, leisure, and social participation.   COMORBIDITIES may have co-morbidities  that affects occupational performance. Patient will benefit from skilled OT to address above impairments and improve overall function.  MODIFICATION OR ASSISTANCE TO COMPLETE EVALUATION: Maximum or significant modification of tasks or assist is necessary to complete an evaluation.  OT OCCUPATIONAL PROFILE AND HISTORY: Comprehensive assessment: Review of records and extensive additional review of physical, cognitive, psychosocial history related to current  functional performance.  CLINICAL DECISION MAKING: High - multiple treatment options, significant modification of task necessary  REHAB POTENTIAL: Fair    EVALUATION COMPLEXITY: High    PLAN: OT FREQUENCY: 2 x's a week  OT DURATION:12 weeks  PLANNED INTERVENTIONS: self care/ADL training, therapeutic exercise, therapeutic activity, neuromuscular re-education, manual therapy, passive range of motion, patient/family education, and cognitive remediation/compensation  RECOMMENDED OTHER SERVICES: PT  CONSULTED AND AGREED WITH PLAN OF CARE: Patient and family member/caregiver  PLAN FOR NEXT SESSION: Initiate treatment  Olegario Messier, MS, OTR/L  08/25/2022

## 2022-08-31 ENCOUNTER — Ambulatory Visit: Payer: BC Managed Care – PPO

## 2022-08-31 ENCOUNTER — Ambulatory Visit: Payer: BC Managed Care – PPO | Admitting: Occupational Therapy

## 2022-09-05 ENCOUNTER — Ambulatory Visit: Payer: BC Managed Care – PPO | Admitting: Occupational Therapy

## 2022-09-05 ENCOUNTER — Ambulatory Visit: Payer: BC Managed Care – PPO

## 2022-09-07 ENCOUNTER — Ambulatory Visit: Payer: BC Managed Care – PPO

## 2022-09-07 ENCOUNTER — Ambulatory Visit: Payer: BC Managed Care – PPO | Attending: Physician Assistant | Admitting: Occupational Therapy

## 2022-09-07 DIAGNOSIS — R269 Unspecified abnormalities of gait and mobility: Secondary | ICD-10-CM | POA: Diagnosis not present

## 2022-09-07 DIAGNOSIS — M6281 Muscle weakness (generalized): Secondary | ICD-10-CM

## 2022-09-07 DIAGNOSIS — R278 Other lack of coordination: Secondary | ICD-10-CM

## 2022-09-07 DIAGNOSIS — R2681 Unsteadiness on feet: Secondary | ICD-10-CM | POA: Insufficient documentation

## 2022-09-07 DIAGNOSIS — R262 Difficulty in walking, not elsewhere classified: Secondary | ICD-10-CM | POA: Insufficient documentation

## 2022-09-07 DIAGNOSIS — I6930 Unspecified sequelae of cerebral infarction: Secondary | ICD-10-CM | POA: Diagnosis not present

## 2022-09-07 NOTE — Therapy (Addendum)
OUTPATIENT PHYSICAL THERAPY TREATMENT NOTE/RECERT     Patient Name: Paul Hall MRN: [id number] DOB:1995-08-16, 27 y.o., male Today's Date: 10/13/2022  PCP:  Cindi Carbon PROVIDER:  Lenise Herald, PA-C   PT End of Session - 09/07/22 1657       Visit Number 104     Number of Visits 128    Date for PT Re-Evaluation 11/30/22     Authorization Type [employer]     Progress Note Due on Visit 110     PT Start Time 1604     PT Stop Time 1646     PT Time Calculation (min) 42 min     Equipment Utilized During Treatment Gait belt   Hoyer lift    Activity Tolerance Patient tolerated treatment well   queasiness/nausea    Behavior During Therapy WFL for tasks assessed/performed                     Past Medical History:  Diagnosis Date   Diabetes mellitus (HCC)    Hypertension    Stroke (HCC)    Past Surgical History:  Procedure Laterality Date   IVC FILTER PLACEMENT ([facility] HX)     LEG SURGERY     PEG TUBE PLACEMENT     TRACHEOSTOMY     Patient Active Problem List   Diagnosis Date Noted   Sepsis due to vancomycin resistant Enterococcus species (HCC) 06/06/2019   SIRS (systemic inflammatory response syndrome) (HCC) 06/05/2019   Acute lower UTI 06/05/2019   VRE (vancomycin-resistant Enterococci) infection 06/05/2019   Anemia 06/05/2019   Skin ulcer of sacrum with necrosis of muscle (HCC)    Urinary retention    Type 2 diabetes mellitus without complication, with long-term current use of insulin (HCC)    Tachycardia    Lower extremity edema    Acute metabolic encephalopathy    Obstructive sleep apnea    Morbid obesity with BMI of 60.0-69.9, adult (HCC)    Goals of care, counseling/discussion    Palliative care encounter    Sepsis (HCC) 04/27/2019   H/O insulin dependent diabetes mellitus 04/27/2019   History of CVA with residual deficit 04/27/2019   Seizure disorder (HCC) 04/27/2019   Decubitus ulcer of sacral region, stage 4 (HCC) 04/27/2019     REFERRING DIAG: Cerebral infarction, unspecified   THERAPY DIAG:  Muscle weakness (generalized)  Other lack of coordination  History of CVA with residual deficit  Rationale for Evaluation and Treatment Rehabilitation  PERTINENT HISTORY: Paul Hall is a 25yoM who presents with severe weakness, quadriparesis, altered sensorium, and visual impairment s/p critical illness and prolonged hospitalization. Pt hospitalized in October 2020 with ARDS 2/2 COVID19 infection. Pt sustained a complex and lengthy hospitalization which included tracheostomy, prolonged sedation, ECMO. In this period pt sustained CVA and SDH. Pt has now been liberated from tracheostomy and G-tube. Pt has since been hospitalized for wound infection and UTI. Pt lives with parents at home, has hospital bed and left chair, hoyer lift transfers, and power WC for mobility needs. Pt needs heavy physical assistance with ADL 2/2 BUE contractures and motor dysfunction   PRECAUTIONS: Fall  SUBJECTIVE: Patient reports missing several visits due to transportation issues due to some insurance changes.    PAIN:   Are you having pain? No   TODAY'S TREATMENT:  -     Physical therapy treatment session today consisted of completing assessment of goals and administration of testing as demonstrated and documented in flow sheet, treatment, and goals section  of this note. Addition treatments may be found below.   *Patient dependently hoyered from power wheelchair  to edge of mat with assist of his mother and Chartered loss adjuster.    Therapeutic Exercises: Sitting at edge of mat while performing the following:   Static sit with UE reaching for Edge of mat x 2 min to get patient acclimated to newer mat. No significant unsteadiness. Trunk exercises-anterior to posterior lean- into PT hands to simulate "nose over toes approach for future transfers." X 20 reps    Lateral trunk leans- placing hands on mat  Lateral with some assist of PT holding  onto R arm due to tone    Seated hip flex- AROM 15 reps each today. Patient able to clear foot off ground  Seated calf raises x 12 reps on each LE (minimal ability to raise heels today)    Knee ext AROM x 20 reps each LE - able to clear the foot Bilaterally  Sit to stand with max A+2 - able to stand approx 15-20 sec - able to clear bottom off mat table today.   Patient dependently hoyered from mat table back to power w/c.     Pt educated throughout session about proper posture and technique with exercises. Improved exercise technique, movement at target joints, use of target muscles after min to mod verbal, visual, tactile cues.       PATIENT EDUCATION: Education details: standing posture and exercise technique Person educated: Patient Education method: Explanation, Demonstration, Tactile cues, and Verbal cues Education comprehension: verbalized understanding, returned demonstration, verbal cues required, tactile cues required, and needs further education   HOME EXERCISE PROGRAM:  Access Code: [email or web address] URL: [email or web address] Date: 03/23/2022 Prepared by: Maureen Ralphs  Exercises - Supine Bridge  - 3 x weekly - 3 sets - 10 reps - 2 hold - Supine Gluteal Sets  - 3 x weekly - 3 sets - 10 reps - 5 sec hold - Supine Quad Set  - 1 x daily - 3 x weekly - 3 sets - 10 reps - 5 hold - Seated Long Arc Quad  - 1 x daily - 7 x weekly - 3 sets - 10 reps - Seated Hip Adduction Squeeze with Ball  - 1 x daily - 3 x weekly - 3 sets - 10 reps - 5 hold - Seated Hip Abduction  - 1 x daily - 3 x weekly - 3 sets - 10 reps - 2 hold    PT Short Term Goals -       PT SHORT TERM GOAL #1   Title Pt will be independent with HEP in order to improve strength and balance in order to decrease fall risk and improve function at home and work.    Baseline 01/04/2021= No formal HEP in place; 12/12 no HEP in place; 05/10/2021-Patient and his father were able to report compliance with  curent HEP consisting of mostly seated/reclined LE strengthening. Both verbalize no questions at this time.    Time 6    Period Weeks    Status Achieved    Target Date 02/15/21              PT Long Term Goals - Date for completion: 06/01/2022         PT LONG TERM GOAL #1   Title Patient will increase BLE gross strength by 1/2 muscle grade to improve functional strength for improved independence with potential gait, increased standing tolerance and increased ADL ability.    Baseline 01/04/2021-  Patient presents with 1/5 to 3-/5 B LE strength with MMT; 12/12: goal partially met for Left knee/hip; 05/10/2021= 2-/5 bilateal Hip flex; 3+/5 bilateral Knee ext; 06/21/2021= Patient presents with 2-/5 bilateral Hip flex; 3+/5 bilateral knee ext/flex; 2-/5 left ankle DF; 0/5 right ankle- and able to increase reps and resistance with LE's. 09/15/2021- Patient technically presents with 2-/5 B hip flex/abd/add - but he is able to raise his hip up to approx 100 deg which has improved. 3+/5 Bilateral knee ext, 2-/5 left ankle and 0/5 right ankle.  12/08/2021= Patient able to lift left knee at 110 deg of hip flex; presents with 3+/5 knee ext, 2-/5 left ankle DF and 0/5 right ankle DF, 2-/5 bilateral Hip abd in seated position.   12/6: R: knee 3+/5 ext, 2/5 flexion, left knee 3+/5 extension, 3+/5 flexion, R hip: 2+/5 hip add, 2+/5 hip ABD L hip: 4-/5 hip ABD, 3+/5 hip ADD, 3+/5 hip flexion; 06/06/2022= Patient now presents with 2-/5 right ankle DF/PF;    Time 12    Period Weeks    Status MET   Target Date 03/07/2022     PT LONG TERM GOAL #2   Title Patient will tolerate sitting unsupported demonstrating erect sitting posture for 15 minutes with CGA to demonstrate improved back extensor strength and improved sitting tolerance.    Baseline 01/04/2021- Patient confied to sitting in lift chair or electric power chair with back support and unable to sit upright without physical assistance; 12/12: tolerates <1 minutes  upright unsupported sitting. 05/10/2021=static sit with forward trunk lean  in his power wheelchair without back support x approx 3 min. 06/21/2021=Unable to assess today due to patient with acute back pain but on previous visit able to sit x 8 min without back support. 09/15/2021- on last visit- 09/13/2021- patient was able to sit unsupported x 8 min at edge of mat. 10/13/2023 - Patient was able to sit at edge of mat with varying level of assist today from SBA to min A for a total of 20 min. 12/13/2021= Patient demonstrated unsupported sitting at edge of mat for approx 20 min   Time 12    Period Weeks    Status GOAL MET   Target Date 12/08/21      PT LONG TERM GOAL #3   Title Patient will demonstrate ability to perform static standing in // bars > 2 min with Max Assist  without loss of balance and fair posture for improved overall strength for pre-gait and transfer activities.    Baseline 01/04/2021= Patient current uanble to stand- Dependent on hoyer or sit to stand lift for transfers. 05/10/2021=Not appropriate yet- Currently still dependent with all transfers using hoyer. 06/21/2021= Patient continuing now to focus on LE strengthening to prepare for standing-unable to try today due to acute low back pain-  planning on attempting in new cert period. 09/15/2021- Patient has attempted standing 2x in past two week- max Assist of 2 people - only once was he successful to clearing his bottom from chair - Will continue to be a focus during the new certification. 12/13/2021= Patient has been limited secondary to increased overall low back pain during this certification and will require more time to focus on this goal.  12/6: not assessed this date, will assess at date when 2-3 PTs are present for assistance    Time 12    Period Weeks    Status GOAL not appropriate at this time - may attempt in future once Patient presents with improved  overall LE strength.          PT LONG TERM GOAL #4   Title Pt will improve FOTO  score by 10 points or more demonstrating improved perceived functional ability    Baseline FOTO 7 on 10/17; 03/15/21: FOTO 12; 05/10/2021 06/21/2021= 1; 09/15/2021= 9; 12/13/2021= Will issue next visit 12/6: 4; 06/06/2022= Will assess next visit   Time 12    Period Weeks    Status ONGOING   Target date 11/30/2022     PT LONG TERM GOAL #5   Title Patient will perform sit to stand transfer with appropriate AD and max assist of 2 people with 75% consistency to prepare for pregait activities.    Baseline 09/15/2021= Patient unable to stand well- unable to clear his bottom off chair with Max assist of 2 persons. 12/13/2021- Goal not appropriate to try yet but will keep and roll over to next cert as shift continues to focus on transfers/standing; 4/24= Patient able to perform active ankle DF/PF with right LE and able to raise his knee into seated march and clear floor without physical assist today - as previously unable as well as lift right knee ext to near full ROM to improve strength for eventual standing. 09/07/2022- Goal continues to not be appropriate but patient is now standing some and will keep goal active   Time 12    Period Weeks    Status Goal still not appropriate for now but will keep active for future   Target date    PT LONG TERM GOAL #6  Title Patient will tolerate sitting unsupported demonstrating erect sitting posture for 30 minutes with CGA to demonstrate improved back extensor strength and improved sitting tolerance.   Baseline 12/13/2021= Patient demonstrated unsupported sitting at edge of mat for approx 20 min;  12/29/2021- Patient performed approx 30 min of dynamic sitting activities today. 06/06/2022= Patient demonstrated ability to sit and perform static and dynamic UE/LE movement with only Supervision.   Time 12   Period Weeks   Status GOAL MET         7.  Patient will tolerate 5 minutes or more of standing in sit to stand lift or with max assist +2 in order to indicate improved lower  extremity weightbearing tolerance for progression to standing in parallel bars. Baseline: 1 minute on most recent stand 02/21/22; 06/06/2022- Patient did attempt today after complaining of right LE pain last week. He attempted 3 stands using sit to stand lift. - all over 1 min- last one approx 48 sec- stopped due to fatigue. More erect standing in lift today - still poor gluteal strength but able to activate glutes and extend hips upon command but unable to hold > 5 sec. 07/28/2022- Will attempt standing next visit but patient has been able to stand since last progress note for up to 3 min at a time at his best. 09/07/2022-  attempted standing with max +2 and patient able to stand 15-20 sec so will now  revise goal Goal status: GOAL Revised/ongoing Target date: 11/30/2022  8. Patient will tolerate sitting unsupported demonstrating ability to perform dynamic UE/LE activities for 30 minutes  independently to demonstrate ability to sit at edge of bed to eat or perform some ADL's/exercise for optimal quality of life.  Baseline: 06/06/2022- Patient able to sit and perform some UE/LE exercises but requires CGA at times for safety- he is able to static sit for 30 min with supervision. 07/27/2022= Patient able to now sit >  30 min with Supervision only - performing static and dynamic activities. Will keep goal active to ensure patient is consistent. 09/07/2022=  Patient able to demonstrate consistent ability to sit at edge of mat during visits  Goal Status: MET  Target date: 08/29/2022       Plan     Clinical Impression Statement Patient presents with good motivation today. He was able to demonstrate improved overall LE strength- able to clear foot with hip march and knee ext. He was also able to stand for 1st time without a powered machine using +2 Max Assist. Will continue to progress functional mobility and revised standing goal. Patient was excited to be able to stand and this will continue to be a focus in  future visits.  Pt will continue to benefit from skilled physical therapy intervention to address impairments, improve QOL, and attain therapy goals.    Personal Factors and Comorbidities Comorbidity 3+;Time since onset of injury/illness/exacerbation    Comorbidities CVA, diabetes, Seizures    Examination-Activity Limitations Bathing;Bed Mobility;Bend;Caring for Others;Carry;Dressing;Hygiene/Grooming;Lift;Locomotion Level;Reach Overhead;Self Feeding;Sit;Squat;Stairs;Stand;Transfers;Toileting    Examination-Participation Restrictions Cleaning;Community Activity;Driving;Laundry;Medication Management;Meal Prep;Occupation;Personal Finances;Shop;Yard Work;Volunteer    Stability/Clinical Decision Making Evolving/Moderate complexity    Rehab Potential Fair    PT Frequency 2x / week    PT Duration 12 weeks    PT Treatment/Interventions ADLs/Self Care Home Management;Cryotherapy;Electrical Stimulation;Moist Heat;Ultrasound;DME Instruction;Gait training;Stair training;Functional mobility training;Therapeutic exercise;Balance training;Patient/family education;Orthotic Fit/Training;Neuromuscular re-education;Wheelchair mobility training;Manual techniques;Passive range of motion;Dry needling;Energy conservation;Taping;Visual/perceptual remediation/compensation;Joint Manipulations    PT Next Visit Plan core strength- sitting at EOM, postural control, sit to stand if able; Continue with progressive LE Strengthening.    PT Home Exercise Plan No changes to HEP today    Consulted and Agree with Plan of Care Patient;Family member/caregiver    Family Member Consulted Mom             Louis Meckel, PT Physical Therapist- [facility]  [facility]  10/13/2022, 10:53 AM

## 2022-09-08 NOTE — Therapy (Signed)
Occupational Therapy Neuro Treatment Note  Patient Name: Paul Hall MRN: [id number] DOB:October 15, 1995, 27 y.o., male Today's Date: 09/08/2022  PCP: Dr. Sherwood Gambler REFERRING PROVIDER: Dr. Sherwood Gambler   OT End of Session - 09/08/22 0853     Visit Number [id number]    Number of Visits 60    Date for OT Re-Evaluation 11/07/22    Authorization Type Progress report period starting 10/06/2021    OT Start Time 1650    OT Stop Time 1730    OT Time Calculation (min) 40 min    Activity Tolerance Patient tolerated treatment well    Behavior During Therapy WFL for tasks assessed/performed                     Past Medical History:  Diagnosis Date   Diabetes mellitus (HCC)    Hypertension    Stroke [facility])    Past Surgical History:  Procedure Laterality Date   IVC FILTER PLACEMENT ([facility] HX)     LEG SURGERY     PEG TUBE PLACEMENT     TRACHEOSTOMY     Patient Active Problem List   Diagnosis Date Noted   Sepsis due to vancomycin resistant Enterococcus species (HCC) 06/06/2019   SIRS (systemic inflammatory response syndrome) (HCC) 06/05/2019   Acute lower UTI 06/05/2019   VRE (vancomycin-resistant Enterococci) infection 06/05/2019   Anemia 06/05/2019   Skin ulcer of sacrum with necrosis of muscle (HCC)    Urinary retention    Type 2 diabetes mellitus without complication, with long-term current use of insulin (HCC)    Tachycardia    Lower extremity edema    Acute metabolic encephalopathy    Obstructive sleep apnea    Morbid obesity with BMI of 60.0-69.9, adult (HCC)    Goals of care, counseling/discussion    Palliative care encounter    Sepsis (HCC) 04/27/2019   H/O insulin dependent diabetes mellitus 04/27/2019   History of CVA with residual deficit 04/27/2019   Seizure disorder (HCC) 04/27/2019   Decubitus ulcer of sacral region, stage 4 (HCC) 04/27/2019    ONSET DATE: 01/2019  REFERRING DIAG: CVA/COVID-19  THERAPY DIAG:  Muscle weakness (generalized)  Other lack of  coordination  Rationale for Evaluation and Treatment Rehabilitation  SUBJECTIVE:   SUBJECTIVE STATEMENT:  Pt. /caregiver report the Pt. Has been going outside in his neighborhood consistently for "walk and rolls".  Pt reports that he is conquering one of his fears since COVID, and trying to increase the amount of time outside.   Pt accompanied by: self and family member  PERTINENT HISTORY:  Pt. is a 27 y.o. male who was diagnosed with COVID-19, and CVA with resultant quadriplegia. Pt. Was then hospitalized with VRE UTI. PMHx includes: urinary retention, seizure disorder, obstructive sleep disorder, DM Type II, Morbid obesity.   PRECAUTIONS: None  WEIGHT BEARING RESTRICTIONS No  PAIN:  Are you having pain? No  FALLS: Has patient fallen in last 6 months? No  LIVING ENVIRONMENT: Lives with: lives with their family Lives in: House/apartment Stairs: No Level Entry Has following equipment at home: Wheelchair (power) and hoyer lift, sit to stand lift  PLOF: Independent  PATIENT GOALS: To be able to engage in more daily care tasks.  OBJECTIVE:   HAND DOMINANCE: Right  ADLs:  Transfers/ambulation related to ADLs: Eating: Pt. reports being able to hold standard utensils, and is starting to engage more in self-feeding tasks, hand to mouth patterns. Pt. Reports that he does as much as he can with  the task, and family assists  with the remainder of the task. Grooming: Pt. Is able to initiate holding an electric toothbrush, and brush his teeth. Family assists LB Dressing: Total Assist UE dressing: Pt. is now able to reach up to actively assist with grasping , and pulling his gown down. Toileting:  Total Assist Bathing: MaxA UB, Total assist LB Tub Shower transfers: N/A Equipment: See above    IADLs: Shopping: Relies on family to assist Light housekeeping: Total Assist Meal Prep: Total Assist Community mobility:   Medication management:  Total Assist  Financial management:  N/A Handwriting: Not legible: Pt. Is able to hold a pen with the left hand, and initiate marking the page. Pt.'s eye glasses were not available  MOBILITY STATUS:  Power w/c  POSTURE COMMENTS:  Pt. Requires position changes in his power w/c  ACTIVITY TOLERANCE: Activity tolerance:  Fair  FUNCTIONAL OUTCOME MEASURES: FOTO: 40  TR score: 45  05/25/2022:   FOTO: 50 TR score: 45  07/20/22: FOTO 55  UPPER EXTREMITY ROM     Active ROM Right eval Right 03/21/2022 Right 05/25/2022 R  07/20/22 Right  07/25/22 Right 08/15/2022 Left eval Left  03/21/2022 Left 05/25/2022 L  07/20/22 Left 07/25/2022 Left  08/15/2022  Shoulder flexion 106 scaption 105  Scaption 62 flexion 110 scaption 100  105 119 123 125 130  136  Shoulder abduction 114 90 97 100  105 110 115 115 115  118  Shoulder adduction              Shoulder extension              Shoulder internal rotation              Shoulder external rotation              Elbow flexion 120(130) 145 145 140  144 135(135) 145 145   145  Elbow extension -45(-35) -22(-35) -28(-22)  -32(-21) -32(-28) -27(-18) -21(-20) -20(-16)  -25(-10) -23(-10)  Wrist flexion              Wrist extension -30(10) -25(10) -10(30)  -10(20) -10(25) 10(50) 19(50) 28(50)  30(50) 35(55)  Wrist ulnar deviation              Wrist radial deviation              Wrist pronation              Wrist supination   Limited by flexor tone      Limited by flexor tone     (Blank rows = not tested)      UPPER EXTREMITY MMT:     Right eval Right 03/21/2022 Right  05/25/2022 R 07/20/22 Right 08/25/2022 Left eval Left 03/21/2022 Left  05/25/2022 L 07/20/22 Left  08/15/2022  Shoulder flexion 3-/5 scaption 3-/5 3-/5 3+/5 3+/5 3-/5 3/5 3+/5 4+/5 4+/5  Shoulder abduction 3-/5 3-/5 3-/5 4/5 4/5 3-/5 3-/5 4-/5 4+/5 4+/5  Shoulder adduction            Shoulder extension            Shoulder internal rotation            Shoulder external rotation            Elbow flexion 3/5 3/5 3+/5  4/5 4+/5 3/5 3+/5 4-/5 4+/5 5/5  Elbow extension 2-/5 2/5 3-/5 4+/5 4+/5 2-/5 2/5  4+/5 4+/5  Wrist flexion  Wrist extension 2-/5 2-/5 2/5 2/5 2/5 2/5 2+/5 3-/5  3-/5  Wrist ulnar deviation            Wrist radial deviation            Wrist pronation            Wrist supination            (Blank rows = not tested)     HAND FUNCTION:  Grip strength: Right: 0 lbs; Left: 0 lbs and Lateral pinch: Right: 5 lbs, Left: 2 lbs  03/21/2022: Lateral pinch: Right: 3.5 lbs, Left: 2 lbs  07/20/22: Grip strength: Right: 3 lbs; Left: 4 lbs and Lateral pinch: Right: 5 lbs, Left: 3 lbs  08/15/2022:  Grip strength: Right: 0 lbs; Left: 5 lbs and Lateral pinch: Right: 2 lbs, Left: 4 lbs  Bilateral digit PIP/DIP flexion contractures with MP hyperextension with attempts for AROM. Pt. is able to tolerate AROM to the bilateral digits at the initial evaluation however, has a history of pain in the digits.  COORDINATION:  Eval: Pt. is unable to grasp 9-hole test pegs. Pt. is able to initiate grasping larger pegs, and is able to hold a pen in the left hand.  07/20/22: 2 min 36 seconds to remove 9 pegs from 9 hole peg test - cues to locate pegs 2/2 low vision. Pt. is able to initiate grasping larger pegs on R hand and is able to hold a pen in the left hand.  08/15/2022:      Vision, and sensation limiting accuracy of 9 hole peg test results. Pt. Was able to grasp and remove vertical pegs intermittently with cues.    SENSATION: Light touch: Impaired   EDEMA:  N/A  MUSCLE TONE: BUE flexor Spasticity  COGNITION Overall cognitive status: Continue to assess in functional context  VISION:   Subjective report: Pt. was not wearing glasses at the time of the initial eval.  Baseline vision: Vision is very limited. Wears glasses all the time Visual history: History of impaired vision following CVA. Pt. Has received treatment through the [facility] low vision rehabilitation program.   VISION  ASSESSMENT: Impaired To be further assessed in functional context  PERCEPTION: Impaired   PRAXIS: Impaired: motor planning  OBSERVATIONS:  Pt reports being on Tramadol   TODAY'S TREATMENT 08/25/2022   Therapeutic Exercise:   Pt. completed seated BUE therex using boxing speed bag set at head height; intermittent rest breaks upgraded. 4# wrist weight  was provided to the Left arm. No weight to the right UE today. Pt. required support at the elbow to hit the speed bag. Pt. tolerated increased focus on PROM with slow prolonged gentle stretching for scapular elevation, depression,  rotation/abduction, elbow flexion, extension, forearm supination, wrist extension, digit MP, PIP, and DIP extension, and thumb abduction.     PATIENT EDUCATION: Education details: There. Halford Decamp Person educated: Patient and Parent Education method: Explanation, Demonstration, Tactile cues, and Verbal cues Education comprehension: verbalized understanding, returned demonstration, verbal cues required, and needs further education   HOME EXERCISE PROGRAM:   Continue ongoing assessment, and continue to provide as needed.     GOALS: Goals reviewed with patient? Yes  SHORT TERM GOALS: Target date:09/26/2022       To assess splint fit, and make appropriate adjustments to promote good skin integrity through the palmar surface of the bilateral hands.  Baseline: 05/25/22: Goal currently met, however ongoing as needs to assess splint fit arise. 03/23/2022: Pt. is wearing  splints a couple of hours at night bilateral resting hand splints. 03/21/2022: Pt. is wearing splints a couple of hours at night bilateral resting hand splints. Goal status: Continue ongoing assessment as requested, and as needed   LONG TERM GOALS: Target date: 11/07/2022    FOTO score will Improve by 2 points for Pt. perceived improvement with the assessment specific ADL/IADL tasks.  Baseline: 07/20/2022: FOTO 55 05/25/2022: FOTO score: 50,   TR score: 45 Eval: FOTO score: 40,  TR score: 45 Goal status: Achieved   2.   Pt. will independently perform oral care for 100% of the task after complete set-up. Baseline: 08/15/2022: Completes 90% of the task, proximal assist at times required at the elbow. 07/20/22: completes 90% of task, limited by shoulder flexion. 05/25/2022:  Pt. Is able to initiate and perform oral care for approximately 90% of the task. Complete set-up required. Assi needed only for the very back teeth. 03/23/2022: Pt. Is able to initiate and perform oral care for approximately 75% of the task. Pt. Requires assist at proximally at the elbow for through oral care. 03/21/2022: Pt. Is able to initiate and perform oral care for approximately 75% of the task. Pt. Requires assist at proximally at the elbow for through oral care. Eval: Pt. is able to initiate using an electric toothbrush. Pt. requires assist for set-up, and assist for thoroughness, and as he Pt. fatigues. Goal status: Ongoing  3.  Pt. Will be independent with self-feeding for 100% of the meal after complete set-up Baseline:08/15/2022:  90% using the spoon, assit at time with the forearm motion, and grip on the utensils. 100% for finger foods.  07/20/22: self-feeds cereal using spoon 90% of task. 05/25/2022: Pt. Is able to use a spoon to scoop cereal when feeding himself cereal 85% of the time. Pt. Is able to feed himself snack/finger foods 100% of the time. Pt. Continues to work on consistency of  stabilizing a cup/mug when drinking. Pt. Is able to grasp a water bottle with assist initially, with assist tapering off as he drinks.03/23/2022: Pt. Is able to perform scooping cereal for 75% of the time. Pt. required assist, and support at the left elbow, and Pt. Presents with limited forearm supination when using the spoon, and bringing it towards his mouth. Pt. Is able to use a fork to spear items, and perform the hand to mouth pattern.  03/21/2022: Pt. Is able to perform scooping  cereal for 75% of the time. Pt. required assist, and support at the left elbow, and Pt. presents with limited forearm supination when using the spoon, and bringing it towards his mouth. Pt. Is able to use a fork to spear items, and perform the hand to mouth pattern.  Eval: Pt. is able to hold standard standard utensils. Pt. Performs as much of the task as he, can and has assistance for the remainder. Goal status: Ongoing  4.  Pt. Will improve grasp patterns and consistently grasp 1/4" objects for ADL, and IADL tasks.  Baseline: 08/15/2022: grasps 1/4" pegs with min a + visual cues, consistently grasping 1/2" objects with visual cues. 07/20/22: grasps 1/4" pegs with min a + visual cues, consistently grasping 1/2" objects with visual cues. 05/25/2022: Pt. Is working on improving consistency of grasping 1/2" objects with visual cues.  03/23/2022: Pt. Is able to grasp 1" objects consistently,and continues to work on the hand patterns needed to grasp 1/2" objects.03/21/2022: Pt. Is able to grasp 1" objects consistently,and continues to work on the hand patterns  needed to grasp 1/2" objects. Eval: Pt. is able to grasp 1" objects intermittently using a lateral grasp pattern. Goal status: Progressing; goal revised to 1/4" items  5.  Pt. will independently write his name legibly with letter sizes under 1". Baseline: 08/15/2022: Pt. Is able to write name with smaller letter size. Pt. Is signing name with a computer styus. Pt. Requires visual cues, and assist. 07/20/22: stabilizing assist to write name with 75% legibility with 2" letters. 05/25/2022: Pt. to continue to work towards formulating grasp patterns in preparation for grasping a large width pen. Pt. Requires visual cues. 03/23/2022: Pt. Is able to write his name with modA, however has difficulty with formulating letter sizes less than 2" in size with 50% legibility for the 3 letters of his name.03/21/2022: Pt. Is able to write his name with modA, however has  difficulty with formulating letter sizes less than 2" in size with 50% legibility for the 3 letters of his name. Eval: Pt is able to hold a thin marker with his left hand, and formulate a line, and initiate a circular pattern (Pt. without glasses today) Goal status: Progressing; Goal revised for more control with smaller writing size  6. Pt. Will reach up to comb/brush his hair  with minA.  Baseline: 5/13/202: 75% using a hair pick, assist to the right side of the head. 07/20/22: reaches 75% of head, assist for far R side of head, fatigues quickly. 05/25/2022: Pt. Is able to reach up with the left hand to the left side, top, and back of his head. 03/23/2022: Pt. is now able to more consistently initiate reaching up to his head with his left hand in preparation for haircare12/18/2023: Pt. is now able to more consistently initiate reaching up to his head with his left hand in preparation for haircare. Eval: Pt. is able to initiate reaching up for hair care with a long handled brush, however is unable to sustain UE's in elevation to perform the task.     Goal status: Ongoing  7. Pt. Will independently navigate the w/c through his environment with minA with visual scanning, and hand placement on the controls.  Baseline: 08/15/2022: Pt. Requires minA to setup hand on controls and MIN cues to navigate the w/c in wide spaces, requires MIN - MOD cues to navigate the w/c through more narrow doorways, and tighter turns - varies based on good vs bad vision days.07/20/22: Pt. Requires minA to setup hand on controls and MIN cues to navigate the w/c in wide spaces, requires MIN - MOD cues to navigate the w/c through more narrow doorways, and tighter turns - varies based on good vs bad vision days. 05/25/2022: Pt. Requires minA  and  cues to navigate the w/c in wide spaces, and requires Mod cues to navigate the w/c through more narrow doorways, and tighter turns. Pt. Requires max cues for scanning through the environment, and  moderate cues for hand placement on the controls.     Goal status: Ongoing    8. Pt. Will improve bilateral grip strength to be able to independently grasp, and pull up blankets, and linens.      Baseline: 08/15/2022: Pt. Has difficulty securely holding, and pulling up blankets, and linens.    Goal status: New   9. Pt. Will consistently actively control the releasing  of blankets, covers, and linens from his hands once they are in the desired position over him. Baseline: 08/15/2022: Pt. has difficulty with controlled releasing of blankets/linens from his grasp.  Goal status: New   ASSESSMENT:  CLINICAL IMPRESSION:  Pt. tolerated 4# wrist weight while boxing with the Left UE with increased speed, and accuracy connecting to the elevated boxing bag. Pt. Presents with increased tone, and tightness through the RUE with increased difficulty connecting to the boxing bag even with support proximally. Pt. Plans to have Botox injections on Friday this week. Pt. continues to work on improving BUE functioning, ROM, strength, motor control, FMC skill, and visual compensatory strategies to be able to independently pull up, and release blankets/covers, improve writing legibility, improve self-care including oral care, and utensil use during self-feeding tasks, and maximize independence with ADLs, and IADLs.     PERFORMANCE DEFICITS in functional skills including ADLs, IADLs, coordination, dexterity, proprioception, ROM, strength, pain, FMC, GMC, decreased knowledge of use of DME, and UE functional use, cognitive skills including safety awareness, and psychosocial skills including coping strategies, environmental adaptation, habits, and routines and behaviors.   IMPAIRMENTS are limiting patient from ADLs, IADLs, leisure, and social participation.   COMORBIDITIES may have co-morbidities  that affects occupational performance. Patient will benefit from skilled OT to address above impairments and improve  overall function.  MODIFICATION OR ASSISTANCE TO COMPLETE EVALUATION: Maximum or significant modification of tasks or assist is necessary to complete an evaluation.  OT OCCUPATIONAL PROFILE AND HISTORY: Comprehensive assessment: Review of records and extensive additional review of physical, cognitive, psychosocial history related to current functional performance.  CLINICAL DECISION MAKING: High - multiple treatment options, significant modification of task necessary  REHAB POTENTIAL: Fair    EVALUATION COMPLEXITY: High    PLAN: OT FREQUENCY: 2 x's a week  OT DURATION:12 weeks  PLANNED INTERVENTIONS: self care/ADL training, therapeutic exercise, therapeutic activity, neuromuscular re-education, manual therapy, passive range of motion, patient/family education, and cognitive remediation/compensation  RECOMMENDED OTHER SERVICES: PT  CONSULTED AND AGREED WITH PLAN OF CARE: Patient and family member/caregiver  PLAN FOR NEXT SESSION: Initiate treatment  Olegario Messier, MS, OTR/L  09/08/2022

## 2022-09-09 DIAGNOSIS — G8250 Quadriplegia, unspecified: Secondary | ICD-10-CM | POA: Diagnosis not present

## 2022-09-12 ENCOUNTER — Ambulatory Visit: Payer: BC Managed Care – PPO | Admitting: Occupational Therapy

## 2022-09-12 ENCOUNTER — Ambulatory Visit: Payer: BC Managed Care – PPO

## 2022-09-12 DIAGNOSIS — M6281 Muscle weakness (generalized): Secondary | ICD-10-CM | POA: Diagnosis not present

## 2022-09-12 DIAGNOSIS — R278 Other lack of coordination: Secondary | ICD-10-CM

## 2022-09-12 DIAGNOSIS — I6930 Unspecified sequelae of cerebral infarction: Secondary | ICD-10-CM | POA: Diagnosis not present

## 2022-09-12 DIAGNOSIS — R2681 Unsteadiness on feet: Secondary | ICD-10-CM

## 2022-09-12 DIAGNOSIS — R262 Difficulty in walking, not elsewhere classified: Secondary | ICD-10-CM

## 2022-09-12 DIAGNOSIS — R269 Unspecified abnormalities of gait and mobility: Secondary | ICD-10-CM | POA: Diagnosis not present

## 2022-09-12 NOTE — Therapy (Cosign Needed)
Occupational Therapy Neuro Treatment Note  Patient Name: Paul Hall MRN: [id number] DOB:07-18-1995, 27 y.o., male Today's Date: 09/13/2022  PCP: Dr. Sherwood Gambler REFERRING PROVIDER: Dr. Sherwood Gambler   OT End of Session - 09/12/22 1737     Visit Number [id number]    Number of Visits 60    Date for OT Re-Evaluation 11/07/22    OT Start Time 1645    OT Stop Time 1730    OT Time Calculation (min) 45 min    Equipment Utilized During Treatment tilt in space power wc    Activity Tolerance Patient tolerated treatment well    Behavior During Therapy WFL for tasks assessed/performed                     Past Medical History:  Diagnosis Date   Diabetes mellitus (HCC)    Hypertension    Stroke [facility])    Past Surgical History:  Procedure Laterality Date   IVC FILTER PLACEMENT ([facility] HX)     LEG SURGERY     PEG TUBE PLACEMENT     TRACHEOSTOMY     Patient Active Problem List   Diagnosis Date Noted   Sepsis due to vancomycin resistant Enterococcus species (HCC) 06/06/2019   SIRS (systemic inflammatory response syndrome) (HCC) 06/05/2019   Acute lower UTI 06/05/2019   VRE (vancomycin-resistant Enterococci) infection 06/05/2019   Anemia 06/05/2019   Skin ulcer of sacrum with necrosis of muscle (HCC)    Urinary retention    Type 2 diabetes mellitus without complication, with long-term current use of insulin (HCC)    Tachycardia    Lower extremity edema    Acute metabolic encephalopathy    Obstructive sleep apnea    Morbid obesity with BMI of 60.0-69.9, adult (HCC)    Goals of care, counseling/discussion    Palliative care encounter    Sepsis (HCC) 04/27/2019   H/O insulin dependent diabetes mellitus 04/27/2019   History of CVA with residual deficit 04/27/2019   Seizure disorder (HCC) 04/27/2019   Decubitus ulcer of sacral region, stage 4 (HCC) 04/27/2019    ONSET DATE: 01/2019  REFERRING DIAG: CVA/COVID-19  THERAPY DIAG:  Muscle weakness (generalized)  Rationale for  Evaluation and Treatment Rehabilitation  SUBJECTIVE:   SUBJECTIVE STATEMENT:  Pt. /caregiver reports the Pt. Has continued to be going outside in his neighborhood for "walk and rolls". Pt. Reported that he received botox injections this past Friday and has seen improvement.  Pt accompanied by: self and family member  PERTINENT HISTORY:  Pt. is a 27 y.o. male who was diagnosed with COVID-19, and CVA with resultant quadriplegia. Pt. Was then hospitalized with VRE UTI. PMHx includes: urinary retention, seizure disorder, obstructive sleep disorder, DM Type II, Morbid obesity.   PRECAUTIONS: None  WEIGHT BEARING RESTRICTIONS No  PAIN:  Are you having pain? No  FALLS: Has patient fallen in last 6 months? No  LIVING ENVIRONMENT: Lives with: lives with their family Lives in: House/apartment Stairs: No Level Entry Has following equipment at home: Wheelchair (power) and hoyer lift, sit to stand lift  PLOF: Independent  PATIENT GOALS: To be able to engage in more daily care tasks.  OBJECTIVE:   HAND DOMINANCE: Right  ADLs:  Transfers/ambulation related to ADLs: Eating: Pt. reports being able to hold standard utensils, and is starting to engage more in self-feeding tasks, hand to mouth patterns. Pt. Reports that he does as much as he can with the task, and family assists  with the remainder of the  task. Grooming: Pt. Is able to initiate holding an electric toothbrush, and brush his teeth. Family assists LB Dressing: Total Assist UE dressing: Pt. is now able to reach up to actively assist with grasping , and pulling his gown down. Toileting:  Total Assist Bathing: MaxA UB, Total assist LB Tub Shower transfers: N/A Equipment: See above    IADLs: Shopping: Relies on family to assist Light housekeeping: Total Assist Meal Prep: Total Assist Community mobility:   Medication management:  Total Assist  Financial management: N/A Handwriting: Not legible: Pt. Is able to hold a pen  with the left hand, and initiate marking the page. Pt.'s eye glasses were not available  MOBILITY STATUS:  Power w/c  POSTURE COMMENTS:  Pt. Requires position changes in his power w/c  ACTIVITY TOLERANCE: Activity tolerance:  Fair  FUNCTIONAL OUTCOME MEASURES: FOTO: 40  TR score: 45  05/25/2022:   FOTO: 50 TR score: 45  07/20/22: FOTO 55  UPPER EXTREMITY ROM     Active ROM Right eval Right 03/21/2022 Right 05/25/2022 R  07/20/22 Right  07/25/22 Right 08/15/2022 Left eval Left  03/21/2022 Left 05/25/2022 L  07/20/22 Left 07/25/2022 Left  08/15/2022  Shoulder flexion 106 scaption 105  Scaption 62 flexion 110 scaption 100  105 119 123 125 130  136  Shoulder abduction 114 90 97 100  105 110 115 115 115  118  Shoulder adduction              Shoulder extension              Shoulder internal rotation              Shoulder external rotation              Elbow flexion 120(130) 145 145 140  144 135(135) 145 145   145  Elbow extension -45(-35) -22(-35) -28(-22)  -32(-21) -32(-28) -27(-18) -21(-20) -20(-16)  -25(-10) -23(-10)  Wrist flexion              Wrist extension -30(10) -25(10) -10(30)  -10(20) -10(25) 10(50) 19(50) 28(50)  30(50) 35(55)  Wrist ulnar deviation              Wrist radial deviation              Wrist pronation              Wrist supination   Limited by flexor tone      Limited by flexor tone     (Blank rows = not tested)      UPPER EXTREMITY MMT:     Right eval Right 03/21/2022 Right  05/25/2022 R 07/20/22 Right 08/25/2022 Left eval Left 03/21/2022 Left  05/25/2022 L 07/20/22 Left  08/15/2022  Shoulder flexion 3-/5 scaption 3-/5 3-/5 3+/5 3+/5 3-/5 3/5 3+/5 4+/5 4+/5  Shoulder abduction 3-/5 3-/5 3-/5 4/5 4/5 3-/5 3-/5 4-/5 4+/5 4+/5  Shoulder adduction            Shoulder extension            Shoulder internal rotation            Shoulder external rotation            Elbow flexion 3/5 3/5 3+/5 4/5 4+/5 3/5 3+/5 4-/5 4+/5 5/5  Elbow extension 2-/5 2/5  3-/5 4+/5 4+/5 2-/5 2/5  4+/5 4+/5  Wrist flexion            Wrist extension 2-/5 2-/5 2/5 2/5 2/5 2/5 2+/5  3-/5  3-/5  Wrist ulnar deviation            Wrist radial deviation            Wrist pronation            Wrist supination            (Blank rows = not tested)     HAND FUNCTION:  Grip strength: Right: 0 lbs; Left: 0 lbs and Lateral pinch: Right: 5 lbs, Left: 2 lbs  03/21/2022: Lateral pinch: Right: 3.5 lbs, Left: 2 lbs  07/20/22: Grip strength: Right: 3 lbs; Left: 4 lbs and Lateral pinch: Right: 5 lbs, Left: 3 lbs  08/15/2022:  Grip strength: Right: 0 lbs; Left: 5 lbs and Lateral pinch: Right: 2 lbs, Left: 4 lbs  Bilateral digit PIP/DIP flexion contractures with MP hyperextension with attempts for AROM. Pt. is able to tolerate AROM to the bilateral digits at the initial evaluation however, has a history of pain in the digits.  COORDINATION:  Eval: Pt. is unable to grasp 9-hole test pegs. Pt. is able to initiate grasping larger pegs, and is able to hold a pen in the left hand.  07/20/22: 2 min 36 seconds to remove 9 pegs from 9 hole peg test - cues to locate pegs 2/2 low vision. Pt. is able to initiate grasping larger pegs on R hand and is able to hold a pen in the left hand.  08/15/2022:      Vision, and sensation limiting accuracy of 9 hole peg test results. Pt. Was able to grasp and remove vertical pegs intermittently with cues.    SENSATION: Light touch: Impaired   EDEMA:  N/A  MUSCLE TONE: BUE flexor Spasticity  COGNITION Overall cognitive status: Continue to assess in functional context  VISION:   Subjective report: Pt. was not wearing glasses at the time of the initial eval.  Baseline vision: Vision is very limited. Wears glasses all the time Visual history: History of impaired vision following CVA. Pt. Has received treatment through the [facility] low vision rehabilitation program.   VISION ASSESSMENT: Impaired To be further assessed in functional  context  PERCEPTION: Impaired   PRAXIS: Impaired: motor planning  OBSERVATIONS:  Pt reports being on Tramadol   TODAY'S TREATMENT 09/12/2022   Therapeutic Exercise:   Pt. completed seated BUE therex using boxing speed bag set at head height with intermittent rest breaks. 5# wrist weight  was provided to the Left arm. No weight to the right UE today. Pt. required the speed bag to not be in motion in order to hit the speed bag. Pt. tolerated increased focus on PROM with slow gentle stretching for elbow flexion, extension, forearm supination, wrist extension, and finger extension. Pt.  Performed BUE strengthening on the [employer]. Pt. Worked on chest press punch/push outs, and diagonal push presses across body with min assist to manage cable directions. Pt. Worked with 2.5# on the right and 7.5# on the left. Added reciprocal punches with therapist guiding with right UE and another therapist occasionally guiding left UE for cross directional punches for 10 reps for 3 sets.  Pt and caregiver educated on PROM and stretching of digits bilaterally to avoid excessive hyperextension of digits.  Instructed on blocking of MPs with PIP and DIP extension.       PATIENT EDUCATION: Education details: There. Halford Decamp Person educated: Patient and Parent Education method: Explanation, Demonstration, Tactile cues, and Verbal cues Education comprehension: verbalized understanding, returned demonstration, verbal cues  required, and needs further education   HOME EXERCISE PROGRAM:   Continue ongoing assessment, and continue to provide as needed.     GOALS: Goals reviewed with patient? Yes  SHORT TERM GOALS: Target date:09/26/2022       To assess splint fit, and make appropriate adjustments to promote good skin integrity through the palmar surface of the bilateral hands.  Baseline: 05/25/22: Goal currently met, however ongoing as needs to assess splint fit arise. 03/23/2022: Pt. is wearing splints  a couple of hours at night bilateral resting hand splints. 03/21/2022: Pt. is wearing splints a couple of hours at night bilateral resting hand splints. Goal status: Continue ongoing assessment as requested, and as needed   LONG TERM GOALS: Target date: 11/07/2022    FOTO score will Improve by 2 points for Pt. perceived improvement with the assessment specific ADL/IADL tasks.  Baseline: 07/20/2022: FOTO 55 05/25/2022: FOTO score: 50,  TR score: 45 Eval: FOTO score: 40,  TR score: 45 Goal status: Achieved   2.   Pt. will independently perform oral care for 100% of the task after complete set-up. Baseline: 08/15/2022: Completes 90% of the task, proximal assist at times required at the elbow. 07/20/22: completes 90% of task, limited by shoulder flexion. 05/25/2022:  Pt. Is able to initiate and perform oral care for approximately 90% of the task. Complete set-up required. Assi needed only for the very back teeth. 03/23/2022: Pt. Is able to initiate and perform oral care for approximately 75% of the task. Pt. Requires assist at proximally at the elbow for through oral care. 03/21/2022: Pt. Is able to initiate and perform oral care for approximately 75% of the task. Pt. Requires assist at proximally at the elbow for through oral care. Eval: Pt. is able to initiate using an electric toothbrush. Pt. requires assist for set-up, and assist for thoroughness, and as he Pt. fatigues. Goal status: Ongoing  3.  Pt. Will be independent with self-feeding for 100% of the meal after complete set-up Baseline:08/15/2022:  90% using the spoon, assit at time with the forearm motion, and grip on the utensils. 100% for finger foods.  07/20/22: self-feeds cereal using spoon 90% of task. 05/25/2022: Pt. Is able to use a spoon to scoop cereal when feeding himself cereal 85% of the time. Pt. Is able to feed himself snack/finger foods 100% of the time. Pt. Continues to work on consistency of  stabilizing a cup/mug when drinking. Pt. Is  able to grasp a water bottle with assist initially, with assist tapering off as he drinks.03/23/2022: Pt. Is able to perform scooping cereal for 75% of the time. Pt. required assist, and support at the left elbow, and Pt. Presents with limited forearm supination when using the spoon, and bringing it towards his mouth. Pt. Is able to use a fork to spear items, and perform the hand to mouth pattern.  03/21/2022: Pt. Is able to perform scooping cereal for 75% of the time. Pt. required assist, and support at the left elbow, and Pt. presents with limited forearm supination when using the spoon, and bringing it towards his mouth. Pt. Is able to use a fork to spear items, and perform the hand to mouth pattern.  Eval: Pt. is able to hold standard standard utensils. Pt. Performs as much of the task as he, can and has assistance for the remainder. Goal status: Ongoing  4.  Pt. Will improve grasp patterns and consistently grasp 1/4" objects for ADL, and IADL tasks.  Baseline: 08/15/2022: grasps  1/4" pegs with min a + visual cues, consistently grasping 1/2" objects with visual cues. 07/20/22: grasps 1/4" pegs with min a + visual cues, consistently grasping 1/2" objects with visual cues. 05/25/2022: Pt. Is working on improving consistency of grasping 1/2" objects with visual cues.  03/23/2022: Pt. Is able to grasp 1" objects consistently,and continues to work on the hand patterns needed to grasp 1/2" objects.03/21/2022: Pt. Is able to grasp 1" objects consistently,and continues to work on the hand patterns needed to grasp 1/2" objects. Eval: Pt. is able to grasp 1" objects intermittently using a lateral grasp pattern. Goal status: Progressing; goal revised to 1/4" items  5.  Pt. will independently write his name legibly with letter sizes under 1". Baseline: 08/15/2022: Pt. Is able to write name with smaller letter size. Pt. Is signing name with a computer styus. Pt. Requires visual cues, and assist. 07/20/22: stabilizing  assist to write name with 75% legibility with 2" letters. 05/25/2022: Pt. to continue to work towards formulating grasp patterns in preparation for grasping a large width pen. Pt. Requires visual cues. 03/23/2022: Pt. Is able to write his name with modA, however has difficulty with formulating letter sizes less than 2" in size with 50% legibility for the 3 letters of his name.03/21/2022: Pt. Is able to write his name with modA, however has difficulty with formulating letter sizes less than 2" in size with 50% legibility for the 3 letters of his name. Eval: Pt is able to hold a thin marker with his left hand, and formulate a line, and initiate a circular pattern (Pt. without glasses today) Goal status: Progressing; Goal revised for more control with smaller writing size  6. Pt. Will reach up to comb/brush his hair  with minA.  Baseline: 5/13/202: 75% using a hair pick, assist to the right side of the head. 07/20/22: reaches 75% of head, assist for far R side of head, fatigues quickly. 05/25/2022: Pt. Is able to reach up with the left hand to the left side, top, and back of his head. 03/23/2022: Pt. is now able to more consistently initiate reaching up to his head with his left hand in preparation for haircare12/18/2023: Pt. is now able to more consistently initiate reaching up to his head with his left hand in preparation for haircare. Eval: Pt. is able to initiate reaching up for hair care with a long handled brush, however is unable to sustain UE's in elevation to perform the task.     Goal status: Ongoing  7. Pt. Will independently navigate the w/c through his environment with minA with visual scanning, and hand placement on the controls.  Baseline: 08/15/2022: Pt. Requires minA to setup hand on controls and MIN cues to navigate the w/c in wide spaces, requires MIN - MOD cues to navigate the w/c through more narrow doorways, and tighter turns - varies based on good vs bad vision days.07/20/22: Pt. Requires minA  to setup hand on controls and MIN cues to navigate the w/c in wide spaces, requires MIN - MOD cues to navigate the w/c through more narrow doorways, and tighter turns - varies based on good vs bad vision days. 05/25/2022: Pt. Requires minA  and  cues to navigate the w/c in wide spaces, and requires Mod cues to navigate the w/c through more narrow doorways, and tighter turns. Pt. Requires max cues for scanning through the environment, and moderate cues for hand placement on the controls.     Goal status: Ongoing    8.  Pt. Will improve bilateral grip strength to be able to independently grasp, and pull up blankets, and linens.      Baseline: 08/15/2022: Pt. Has difficulty securely holding, and pulling up blankets, and linens.    Goal status: New   9. Pt. Will consistently actively control the releasing  of blankets, covers, and linens from his hands once they are in the desired position over him. Baseline: 08/15/2022: Pt. has difficulty with controlled releasing of blankets/linens from his grasp.     Goal status: New   ASSESSMENT:  CLINICAL IMPRESSION:  Pt. tolerated 5# wrist weight while boxing with the Left UE with increased speed, and accuracy connecting to the elevated boxing bag. Pt. Was able hit the bag more accurate with the R UE when the boxing bag was not in motion. Pt. Tolerated BUE exercises well today, and was able to perform the Matrix exercises well and with good technique, form and pace. Pt. Required increased verbal cues and assistance when using the R UE. Pt. continues to work on improving BUE functioning, ROM, strength, motor control, FMC skill, and visual compensatory strategies to be able to independently pull up, and release blankets/covers, improve writing legibility, improve self-care including oral care, and utensil use during self-feeding tasks, and maximize independence with ADLs, and IADLs.     PERFORMANCE DEFICITS in functional skills including ADLs, IADLs, coordination,  dexterity, proprioception, ROM, strength, pain, FMC, GMC, decreased knowledge of use of DME, and UE functional use, cognitive skills including safety awareness, and psychosocial skills including coping strategies, environmental adaptation, habits, and routines and behaviors.   IMPAIRMENTS are limiting patient from ADLs, IADLs, leisure, and social participation.   COMORBIDITIES may have co-morbidities  that affects occupational performance. Patient will benefit from skilled OT to address above impairments and improve overall function.  MODIFICATION OR ASSISTANCE TO COMPLETE EVALUATION: Maximum or significant modification of tasks or assist is necessary to complete an evaluation.  OT OCCUPATIONAL PROFILE AND HISTORY: Comprehensive assessment: Review of records and extensive additional review of physical, cognitive, psychosocial history related to current functional performance.  CLINICAL DECISION MAKING: High - multiple treatment options, significant modification of task necessary  REHAB POTENTIAL: Fair    EVALUATION COMPLEXITY: High    PLAN: OT FREQUENCY: 2 x's a week  OT DURATION:12 weeks  PLANNED INTERVENTIONS: self care/ADL training, therapeutic exercise, therapeutic activity, neuromuscular re-education, manual therapy, passive range of motion, patient/family education, and cognitive remediation/compensation  RECOMMENDED OTHER SERVICES: PT  CONSULTED AND AGREED WITH PLAN OF CARE: Patient and family member/caregiver  PLAN FOR NEXT SESSION: Continue with ROM, engagement in BUE movement patterns, HEP   This entire session was performed under direct supervision and direction of a licensed Estate agent . I have personally read, edited and approve of the note as written.   Amy T Lovett, OTR/L, CLT  Herma Carson, OTS 5:41 PM

## 2022-09-12 NOTE — Therapy (Signed)
OUTPATIENT PHYSICAL THERAPY TREATMENT NOTE/RECERT     Patient Name: Paul Hall MRN: [id number] DOB:[birth year], 27 y.o., male Today's Date: 09/12/2022  PCP:  Cindi Carbon PROVIDER:  Lenise Herald, PA-C   PT End of Session - 09/12/22 1709     Visit Number 105    Number of Visits 128   Date corrected per recent recert   Date for PT Re-Evaluation 11/30/22    Authorization Type [employer]    Progress Note Due on Visit 110    PT Start Time 1603    PT Stop Time 1644    PT Time Calculation (min) 41 min    Equipment Utilized During Treatment Gait belt   Hoyer lift   Activity Tolerance Patient tolerated treatment well   queasiness/nausea   Behavior During Therapy WFL for tasks assessed/performed                            Past Medical History:  Diagnosis Date   Diabetes mellitus (HCC)    Hypertension    Stroke (HCC)    Past Surgical History:  Procedure Laterality Date   IVC FILTER PLACEMENT ([facility] HX)     LEG SURGERY     PEG TUBE PLACEMENT     TRACHEOSTOMY     Patient Active Problem List   Diagnosis Date Noted   Sepsis due to vancomycin resistant Enterococcus species (HCC) 06/06/2019   SIRS (systemic inflammatory response syndrome) (HCC) 06/05/2019   Acute lower UTI 06/05/2019   VRE (vancomycin-resistant Enterococci) infection 06/05/2019   Anemia 06/05/2019   Skin ulcer of sacrum with necrosis of muscle (HCC)    Urinary retention    Type 2 diabetes mellitus without complication, with long-term current use of insulin (HCC)    Tachycardia    Lower extremity edema    Acute metabolic encephalopathy    Obstructive sleep apnea    Morbid obesity with BMI of 60.0-69.9, adult (HCC)    Goals of care, counseling/discussion    Palliative care encounter    Sepsis (HCC) 04/27/2019   H/O insulin dependent diabetes mellitus 04/27/2019   History of CVA with residual deficit 04/27/2019   Seizure disorder (HCC) 04/27/2019   Decubitus ulcer of sacral region, stage  4 (HCC) 04/27/2019    REFERRING DIAG: Cerebral infarction, unspecified   THERAPY DIAG:  Muscle weakness (generalized)  Other lack of coordination  History of CVA with residual deficit  Difficulty in walking, not elsewhere classified  Unsteadiness on feet  Rationale for Evaluation and Treatment Rehabilitation  PERTINENT HISTORY: Paul Hall is a 25yoM who presents with severe weakness, quadriparesis, altered sensorium, and visual impairment s/p critical illness and prolonged hospitalization. Pt hospitalized in October 2020 with ARDS 2/2 COVID19 infection. Pt sustained a complex and lengthy hospitalization which included tracheostomy, prolonged sedation, ECMO. In this period pt sustained CVA and SDH. Pt has now been liberated from tracheostomy and G-tube. Pt has since been hospitalized for wound infection and UTI. Pt lives with parents at home, has hospital bed and left chair, hoyer lift transfers, and power WC for mobility needs. Pt needs heavy physical assistance with ADL 2/2 BUE contractures and motor dysfunction   PRECAUTIONS: Fall  SUBJECTIVE: Patient reports having a good weekend and received his Botox injections in his Right arm and feeling some better.     PAIN:   Are you having pain? No   TODAY'S TREATMENT:  -     THERAPEUTIC ACTIVITIES:   Dynamic alt shoulder/scapular  protraction retraction x 15 reps each. Trunk exercises-anterior to posterior lean- into PT hands to simulate "nose over toes approach for future transfers." X 20 reps    Lateral trunk leans- placing hands on mat  Lateral with some assist of PT holding onto R arm due to tone    Seated hip flex- AROM 15 reps each today. Patient able to clear foot off ground  Seated calf raises x 12 reps on each LE (minimal ability to raise heels today)    Knee ext AROM x 20 reps each LE - able to clear the foot Bilaterally  Sit to stand with max A+2 - able to stand approx 15-20 sec - able to clear bottom off mat  table today.   Patient dependently hoyered from mat table back to power w/c.     Pt educated throughout session about proper posture and technique with exercises. Improved exercise technique, movement at target joints, use of target muscles after min to mod verbal, visual, tactile cues.       PATIENT EDUCATION: Education details: standing posture and exercise technique Person educated: Patient Education method: Explanation, Demonstration, Tactile cues, and Verbal cues Education comprehension: verbalized understanding, returned demonstration, verbal cues required, tactile cues required, and needs further education   HOME EXERCISE PROGRAM:  Access Code: [email or web address] URL: [email or web address] Date: 03/23/2022 Prepared by: Maureen Ralphs  Exercises - Supine Bridge  - 3 x weekly - 3 sets - 10 reps - 2 hold - Supine Gluteal Sets  - 3 x weekly - 3 sets - 10 reps - 5 sec hold - Supine Quad Set  - 1 x daily - 3 x weekly - 3 sets - 10 reps - 5 hold - Seated Long Arc Quad  - 1 x daily - 7 x weekly - 3 sets - 10 reps - Seated Hip Adduction Squeeze with Ball  - 1 x daily - 3 x weekly - 3 sets - 10 reps - 5 hold - Seated Hip Abduction  - 1 x daily - 3 x weekly - 3 sets - 10 reps - 2 hold    PT Short Term Goals -       PT SHORT TERM GOAL #1   Title Pt will be independent with HEP in order to improve strength and balance in order to decrease fall risk and improve function at home and work.    Baseline 01/04/2021= No formal HEP in place; 12/12 no HEP in place; 05/10/2021-Patient and his father were able to report compliance with curent HEP consisting of mostly seated/reclined LE strengthening. Both verbalize no questions at this time.    Time 6    Period Weeks    Status Achieved    Target Date 02/15/21              PT Long Term Goals - Date for completion: 06/01/2022         PT LONG TERM GOAL #1   Title Patient will increase BLE gross strength by 1/2 muscle grade to  improve functional strength for improved independence with potential gait, increased standing tolerance and increased ADL ability.    Baseline 01/04/2021- Patient presents with 1/5 to 3-/5 B LE strength with MMT; 12/12: goal partially met for Left knee/hip; 05/10/2021= 2-/5 bilateal Hip flex; 3+/5 bilateral Knee ext; 06/21/2021= Patient presents with 2-/5 bilateral Hip flex; 3+/5 bilateral knee ext/flex; 2-/5 left ankle DF; 0/5 right ankle- and able to increase reps and resistance with LE's. 09/15/2021- Patient technically presents with 2-/5  B hip flex/abd/add - but he is able to raise his hip up to approx 100 deg which has improved. 3+/5 Bilateral knee ext, 2-/5 left ankle and 0/5 right ankle.  12/08/2021= Patient able to lift left knee at 110 deg of hip flex; presents with 3+/5 knee ext, 2-/5 left ankle DF and 0/5 right ankle DF, 2-/5 bilateral Hip abd in seated position.   12/6: R: knee 3+/5 ext, 2/5 flexion, left knee 3+/5 extension, 3+/5 flexion, R hip: 2+/5 hip add, 2+/5 hip ABD L hip: 4-/5 hip ABD, 3+/5 hip ADD, 3+/5 hip flexion; 06/06/2022= Patient now presents with 2-/5 right ankle DF/PF;    Time 12    Period Weeks    Status MET   Target Date 03/07/2022     PT LONG TERM GOAL #2   Title Patient will tolerate sitting unsupported demonstrating erect sitting posture for 15 minutes with CGA to demonstrate improved back extensor strength and improved sitting tolerance.    Baseline 01/04/2021- Patient confied to sitting in lift chair or electric power chair with back support and unable to sit upright without physical assistance; 12/12: tolerates <1 minutes upright unsupported sitting. 05/10/2021=static sit with forward trunk lean  in his power wheelchair without back support x approx 3 min. 06/21/2021=Unable to assess today due to patient with acute back pain but on previous visit able to sit x 8 min without back support. 09/15/2021- on last visit- 09/13/2021- patient was able to sit unsupported x 8 min at edge of  mat. 10/13/2023 - Patient was able to sit at edge of mat with varying level of assist today from SBA to min A for a total of 20 min. 12/13/2021= Patient demonstrated unsupported sitting at edge of mat for approx 20 min   Time 12    Period Weeks    Status GOAL MET   Target Date 12/08/21      PT LONG TERM GOAL #3   Title Patient will demonstrate ability to perform static standing in // bars > 2 min with Max Assist  without loss of balance and fair posture for improved overall strength for pre-gait and transfer activities.    Baseline 01/04/2021= Patient current uanble to stand- Dependent on hoyer or sit to stand lift for transfers. 05/10/2021=Not appropriate yet- Currently still dependent with all transfers using hoyer. 06/21/2021= Patient continuing now to focus on LE strengthening to prepare for standing-unable to try today due to acute low back pain-  planning on attempting in new cert period. 09/15/2021- Patient has attempted standing 2x in past two week- max Assist of 2 people - only once was he successful to clearing his bottom from chair - Will continue to be a focus during the new certification. 12/13/2021= Patient has been limited secondary to increased overall low back pain during this certification and will require more time to focus on this goal.  12/6: not assessed this date, will assess at date when 2-3 PTs are present for assistance    Time 12    Period Weeks    Status GOAL not appropriate at this time - may attempt in future once Patient presents with improved overall LE strength.          PT LONG TERM GOAL #4   Title Pt will improve FOTO score by 10 points or more demonstrating improved perceived functional ability    Baseline FOTO 7 on 10/17; 03/15/21: FOTO 12; 05/10/2021 06/21/2021= 1; 09/15/2021= 9; 12/13/2021= Will issue next visit 12/6: 4; 06/06/2022= Will assess  next visit   Time 12    Period Weeks    Status ONGOING   Target date 08/29/2022     PT LONG TERM GOAL #5   Title Patient will  perform sit to stand transfer with appropriate AD and max assist of 2 people with 75% consistency to prepare for pregait activities.    Baseline 09/15/2021= Patient unable to stand well- unable to clear his bottom off chair with Max assist of 2 persons. 12/13/2021- Goal not appropriate to try yet but will keep and roll over to next cert as shift continues to focus on transfers/standing; 4/24= Patient able to perform active ankle DF/PF with right LE and able to raise his knee into seated march and clear floor without physical assist today - as previously unable as well as lift right knee ext to near full ROM to improve strength for eventual standing.    Time 12    Period Weeks    Status Goal still not appropriate for now but will keep active for future   Target date    PT LONG TERM GOAL #6  Title Patient will tolerate sitting unsupported demonstrating erect sitting posture for 30 minutes with CGA to demonstrate improved back extensor strength and improved sitting tolerance.   Baseline 12/13/2021= Patient demonstrated unsupported sitting at edge of mat for approx 20 min;  12/29/2021- Patient performed approx 30 min of dynamic sitting activities today. 06/06/2022= Patient demonstrated ability to sit and perform static and dynamic UE/LE movement with only Supervision.   Time 12   Period Weeks   Status GOAL MET         7.  Patient will tolerate 5 minutes or more of standing in sit to stand lift in order to indicate improved lower extremity weightbearing tolerance for progression to standing in parallel bars. Baseline: 1 minute on most recent stand 02/21/22; 06/06/2022- Patient did attempt today after complaining of right LE pain last week. He attempted 3 stands using sit to stand lift. - all over 1 min- last one approx 48 sec- stopped due to fatigue. More erect standing in lift today - still poor gluteal strength but able to activate glutes and extend hips upon command but unable to hold > 5 sec. 07/28/2022-  Will attempt standing next visit but patient has been able to stand since last progress note for up to 3 min at a time at his best.  Goal status: ONGOING- Progressing Target date: 08/29/2022  8. Patient will tolerate sitting unsupported demonstrating ability to perform dynamic UE/LE activities for 30 minutes  independently to demonstrate ability to sit at edge of bed to eat or perform some ADL's/exercise for optimal quality of life.  Baseline: 06/06/2022- Patient able to sit and perform some UE/LE exercises but requires CGA at times for safety- he is able to static sit for 30 min with supervision. 07/27/2022= Patient able to now sit > 30 min with Supervision only - performing static and dynamic activities. Will keep goal active to ensure patient is consistent.   Goal Status: PROGRESSING  Target date: 08/29/2022       Plan     Clinical Impression Statement Patient presents with good motivation today. He was able to demonstrate improved overall LE strength- able to clear foot with hip march and knee ext. He was also able to stand for 1st time without a powered machine using +2 Max Assist. Patient was excited to be able to stand and this will continue to be a focus  in future visits.  Pt will continue to benefit from skilled physical therapy intervention to address impairments, improve QOL, and attain therapy goals.    Personal Factors and Comorbidities Comorbidity 3+;Time since onset of injury/illness/exacerbation    Comorbidities CVA, diabetes, Seizures    Examination-Activity Limitations Bathing;Bed Mobility;Bend;Caring for Others;Carry;Dressing;Hygiene/Grooming;Lift;Locomotion Level;Reach Overhead;Self Feeding;Sit;Squat;Stairs;Stand;Transfers;Toileting    Examination-Participation Restrictions Cleaning;Community Activity;Driving;Laundry;Medication Management;Meal Prep;Occupation;Personal Finances;Shop;Yard Work;Volunteer    Stability/Clinical Decision Making Evolving/Moderate complexity    Rehab  Potential Fair    PT Frequency 2x / week    PT Duration 12 weeks    PT Treatment/Interventions ADLs/Self Care Home Management;Cryotherapy;Electrical Stimulation;Moist Heat;Ultrasound;DME Instruction;Gait training;Stair training;Functional mobility training;Therapeutic exercise;Balance training;Patient/family education;Orthotic Fit/Training;Neuromuscular re-education;Wheelchair mobility training;Manual techniques;Passive range of motion;Dry needling;Energy conservation;Taping;Visual/perceptual remediation/compensation;Joint Manipulations    PT Next Visit Plan core strength- sitting at EOM, postural control, sit to stand if able; Continue with progressive LE Strengthening.    PT Home Exercise Plan No changes to HEP today    Consulted and Agree with Plan of Care Patient;Family member/caregiver    Family Member Consulted Mom             Louis Meckel, PT Physical Therapist- [facility]  [facility]  09/12/2022, 5:11 PM

## 2022-09-14 ENCOUNTER — Ambulatory Visit: Payer: BC Managed Care – PPO

## 2022-09-14 ENCOUNTER — Ambulatory Visit: Payer: BC Managed Care – PPO | Admitting: Occupational Therapy

## 2022-09-14 DIAGNOSIS — R262 Difficulty in walking, not elsewhere classified: Secondary | ICD-10-CM | POA: Diagnosis not present

## 2022-09-14 DIAGNOSIS — I6930 Unspecified sequelae of cerebral infarction: Secondary | ICD-10-CM

## 2022-09-14 DIAGNOSIS — R2681 Unsteadiness on feet: Secondary | ICD-10-CM | POA: Diagnosis not present

## 2022-09-14 DIAGNOSIS — R278 Other lack of coordination: Secondary | ICD-10-CM

## 2022-09-14 DIAGNOSIS — R269 Unspecified abnormalities of gait and mobility: Secondary | ICD-10-CM | POA: Diagnosis not present

## 2022-09-14 DIAGNOSIS — M6281 Muscle weakness (generalized): Secondary | ICD-10-CM | POA: Diagnosis not present

## 2022-09-14 NOTE — Therapy (Signed)
OUTPATIENT PHYSICAL THERAPY TREATMENT NOTE     Patient Name: Paul Hall MRN: [id number] DOB:1995/08/31, 27 y.o., male Today's Date: 09/18/2022  PCP:  Cindi Carbon PROVIDER:  Lenise Herald, PA-C   PT End of Session - 09/18/22 2102     Visit Number 106    Number of Visits 128    Date for PT Re-Evaluation 11/30/22    Authorization Type [employer]    Progress Note Due on Visit 110    PT Start Time 1605    PT Stop Time 1645    PT Time Calculation (min) 40 min    Activity Tolerance Patient tolerated treatment well    Behavior During Therapy WFL for tasks assessed/performed                            Past Medical History:  Diagnosis Date   Diabetes mellitus (HCC)    Hypertension    Stroke (HCC)    Past Surgical History:  Procedure Laterality Date   IVC FILTER PLACEMENT ([facility] HX)     LEG SURGERY     PEG TUBE PLACEMENT     TRACHEOSTOMY     Patient Active Problem List   Diagnosis Date Noted   Sepsis due to vancomycin resistant Enterococcus species (HCC) 06/06/2019   SIRS (systemic inflammatory response syndrome) (HCC) 06/05/2019   Acute lower UTI 06/05/2019   VRE (vancomycin-resistant Enterococci) infection 06/05/2019   Anemia 06/05/2019   Skin ulcer of sacrum with necrosis of muscle (HCC)    Urinary retention    Type 2 diabetes mellitus without complication, with long-term current use of insulin (HCC)    Tachycardia    Lower extremity edema    Acute metabolic encephalopathy    Obstructive sleep apnea    Morbid obesity with BMI of 60.0-69.9, adult (HCC)    Goals of care, counseling/discussion    Palliative care encounter    Sepsis (HCC) 04/27/2019   H/O insulin dependent diabetes mellitus 04/27/2019   History of CVA with residual deficit 04/27/2019   Seizure disorder (HCC) 04/27/2019   Decubitus ulcer of sacral region, stage 4 (HCC) 04/27/2019    REFERRING DIAG: Cerebral infarction, unspecified   THERAPY DIAG:  Muscle weakness  (generalized)  Other lack of coordination  History of CVA with residual deficit  Difficulty in walking, not elsewhere classified  Unsteadiness on feet  Rationale for Evaluation and Treatment Rehabilitation  PERTINENT HISTORY: Paul Hall is a 25yoM who presents with severe weakness, quadriparesis, altered sensorium, and visual impairment s/p critical illness and prolonged hospitalization. Pt hospitalized in October 2020 with ARDS 2/2 COVID19 infection. Pt sustained a complex and lengthy hospitalization which included tracheostomy, prolonged sedation, ECMO. In this period pt sustained CVA and SDH. Pt has now been liberated from tracheostomy and G-tube. Pt has since been hospitalized for wound infection and UTI. Pt lives with parents at home, has hospital bed and left chair, hoyer lift transfers, and power WC for mobility needs. Pt needs heavy physical assistance with ADL 2/2 BUE contractures and motor dysfunction   PRECAUTIONS: Fall  SUBJECTIVE: Patient reports not feeling well- states stomach is upset- requesting not to transfer out of chair today.        PAIN:   Are you having pain? No   TODAY'S TREATMENT:  - 09/14/2022     THERAPEUTIC EXERCISE:    PROM to B knee and ankle Knee ext x several min  AAROM B hip flex/Knee flex/Ext 2 sets of  20 reps each LE Manual Resistance with B hip abd/ADD - hold 3 sec x 10 reps each LE  Manual resistance with Leg press - each LE 1 set of 10 reps    Pt educated throughout session about proper posture and technique with exercises. Improved exercise technique, movement at target joints, use of target muscles after min to mod verbal, visual, tactile cues.       PATIENT EDUCATION: Education details: standing posture and exercise technique Person educated: Patient Education method: Explanation, Demonstration, Tactile cues, and Verbal cues Education comprehension: verbalized understanding, returned demonstration, verbal cues required,  tactile cues required, and needs further education   HOME EXERCISE PROGRAM:  Access Code: [email or web address] URL: [email or web address] Date: 03/23/2022 Prepared by: Paul Hall  Exercises - Supine Bridge  - 3 x weekly - 3 sets - 10 reps - 2 hold - Supine Gluteal Sets  - 3 x weekly - 3 sets - 10 reps - 5 sec hold - Supine Quad Set  - 1 x daily - 3 x weekly - 3 sets - 10 reps - 5 hold - Seated Long Arc Quad  - 1 x daily - 7 x weekly - 3 sets - 10 reps - Seated Hip Adduction Squeeze with Ball  - 1 x daily - 3 x weekly - 3 sets - 10 reps - 5 hold - Seated Hip Abduction  - 1 x daily - 3 x weekly - 3 sets - 10 reps - 2 hold    PT Short Term Goals -       PT SHORT TERM GOAL #1   Title Pt will be independent with HEP in order to improve strength and balance in order to decrease fall risk and improve function at home and work.    Baseline 01/04/2021= No formal HEP in place; 12/12 no HEP in place; 05/10/2021-Patient and his father were able to report compliance with curent HEP consisting of mostly seated/reclined LE strengthening. Both verbalize no questions at this time.    Time 6    Period Weeks    Status Achieved    Target Date 02/15/21              PT Long Term Goals - Date for completion: 06/01/2022         PT LONG TERM GOAL #1   Title Patient will increase BLE gross strength by 1/2 muscle grade to improve functional strength for improved independence with potential gait, increased standing tolerance and increased ADL ability.    Baseline 01/04/2021- Patient presents with 1/5 to 3-/5 B LE strength with MMT; 12/12: goal partially met for Left knee/hip; 05/10/2021= 2-/5 bilateal Hip flex; 3+/5 bilateral Knee ext; 06/21/2021= Patient presents with 2-/5 bilateral Hip flex; 3+/5 bilateral knee ext/flex; 2-/5 left ankle DF; 0/5 right ankle- and able to increase reps and resistance with LE's. 09/15/2021- Patient technically presents with 2-/5 B hip flex/abd/add - but he is able to  raise his hip up to approx 100 deg which has improved. 3+/5 Bilateral knee ext, 2-/5 left ankle and 0/5 right ankle.  12/08/2021= Patient able to lift left knee at 110 deg of hip flex; presents with 3+/5 knee ext, 2-/5 left ankle DF and 0/5 right ankle DF, 2-/5 bilateral Hip abd in seated position.   12/6: R: knee 3+/5 ext, 2/5 flexion, left knee 3+/5 extension, 3+/5 flexion, R hip: 2+/5 hip add, 2+/5 hip ABD L hip: 4-/5 hip ABD, 3+/5 hip ADD, 3+/5 hip flexion; 06/06/2022= Patient now presents with  2-/5 right ankle DF/PF;    Time 12    Period Weeks    Status MET   Target Date 03/07/2022     PT LONG TERM GOAL #2   Title Patient will tolerate sitting unsupported demonstrating erect sitting posture for 15 minutes with CGA to demonstrate improved back extensor strength and improved sitting tolerance.    Baseline 01/04/2021- Patient confied to sitting in lift chair or electric power chair with back support and unable to sit upright without physical assistance; 12/12: tolerates <1 minutes upright unsupported sitting. 05/10/2021=static sit with forward trunk lean  in his power wheelchair without back support x approx 3 min. 06/21/2021=Unable to assess today due to patient with acute back pain but on previous visit able to sit x 8 min without back support. 09/15/2021- on last visit- 09/13/2021- patient was able to sit unsupported x 8 min at edge of mat. 10/13/2023 - Patient was able to sit at edge of mat with varying level of assist today from SBA to min A for a total of 20 min. 12/13/2021= Patient demonstrated unsupported sitting at edge of mat for approx 20 min   Time 12    Period Weeks    Status GOAL MET   Target Date 12/08/21      PT LONG TERM GOAL #3   Title Patient will demonstrate ability to perform static standing in // bars > 2 min with Max Assist  without loss of balance and fair posture for improved overall strength for pre-gait and transfer activities.    Baseline 01/04/2021= Patient current uanble to  stand- Dependent on hoyer or sit to stand lift for transfers. 05/10/2021=Not appropriate yet- Currently still dependent with all transfers using hoyer. 06/21/2021= Patient continuing now to focus on LE strengthening to prepare for standing-unable to try today due to acute low back pain-  planning on attempting in new cert period. 09/15/2021- Patient has attempted standing 2x in past two week- max Assist of 2 people - only once was he successful to clearing his bottom from chair - Will continue to be a focus during the new certification. 12/13/2021= Patient has been limited secondary to increased overall low back pain during this certification and will require more time to focus on this goal.  12/6: not assessed this date, will assess at date when 2-3 PTs are present for assistance    Time 12    Period Weeks    Status GOAL not appropriate at this time - may attempt in future once Patient presents with improved overall LE strength.          PT LONG TERM GOAL #4   Title Pt will improve FOTO score by 10 points or more demonstrating improved perceived functional ability    Baseline FOTO 7 on 10/17; 03/15/21: FOTO 12; 05/10/2021 06/21/2021= 1; 09/15/2021= 9; 12/13/2021= Will issue next visit 12/6: 4; 06/06/2022= Will assess next visit   Time 12    Period Weeks    Status ONGOING   Target date 08/29/2022     PT LONG TERM GOAL #5   Title Patient will perform sit to stand transfer with appropriate AD and max assist of 2 people with 75% consistency to prepare for pregait activities.    Baseline 09/15/2021= Patient unable to stand well- unable to clear his bottom off chair with Max assist of 2 persons. 12/13/2021- Goal not appropriate to try yet but will keep and roll over to next cert as shift continues to focus on transfers/standing; 4/24= Patient  able to perform active ankle DF/PF with right LE and able to raise his knee into seated march and clear floor without physical assist today - as previously unable as well as lift  right knee ext to near full ROM to improve strength for eventual standing.    Time 12    Period Weeks    Status Goal still not appropriate for now but will keep active for future   Target date    PT LONG TERM GOAL #6  Title Patient will tolerate sitting unsupported demonstrating erect sitting posture for 30 minutes with CGA to demonstrate improved back extensor strength and improved sitting tolerance.   Baseline 12/13/2021= Patient demonstrated unsupported sitting at edge of mat for approx 20 min;  12/29/2021- Patient performed approx 30 min of dynamic sitting activities today. 06/06/2022= Patient demonstrated ability to sit and perform static and dynamic UE/LE movement with only Supervision.   Time 12   Period Weeks   Status GOAL MET         7.  Patient will tolerate 5 minutes or more of standing in sit to stand lift in order to indicate improved lower extremity weightbearing tolerance for progression to standing in parallel bars. Baseline: 1 minute on most recent stand 02/21/22; 06/06/2022- Patient did attempt today after complaining of right LE pain last week. He attempted 3 stands using sit to stand lift. - all over 1 min- last one approx 48 sec- stopped due to fatigue. More erect standing in lift today - still poor gluteal strength but able to activate glutes and extend hips upon command but unable to hold > 5 sec. 07/28/2022- Will attempt standing next visit but patient has been able to stand since last progress note for up to 3 min at a time at his best.  Goal status: ONGOING- Progressing Target date: 08/29/2022  8. Patient will tolerate sitting unsupported demonstrating ability to perform dynamic UE/LE activities for 30 minutes  independently to demonstrate ability to sit at edge of bed to eat or perform some ADL's/exercise for optimal quality of life.  Baseline: 06/06/2022- Patient able to sit and perform some UE/LE exercises but requires CGA at times for safety- he is able to static sit for  30 min with supervision. 07/27/2022= Patient able to now sit > 30 min with Supervision only - performing static and dynamic activities. Will keep goal active to ensure patient is consistent.   Goal Status: PROGRESSING  Target date: 08/29/2022       Plan     Clinical Impression Statement Treatment concentrated on LE strengthening only today due to patient not feeling well. He was able to participate well and no significant issues- mostly AAROM and some manual resistance activities to challenge his overall strength. . Pt will continue to benefit from skilled physical therapy intervention to address impairments, improve QOL, and attain therapy goals.    Personal Factors and Comorbidities Comorbidity 3+;Time since onset of injury/illness/exacerbation    Comorbidities CVA, diabetes, Seizures    Examination-Activity Limitations Bathing;Bed Mobility;Bend;Caring for Others;Carry;Dressing;Hygiene/Grooming;Lift;Locomotion Level;Reach Overhead;Self Feeding;Sit;Squat;Stairs;Stand;Transfers;Toileting    Examination-Participation Restrictions Cleaning;Community Activity;Driving;Laundry;Medication Management;Meal Prep;Occupation;Personal Finances;Shop;Yard Work;Volunteer    Stability/Clinical Decision Making Evolving/Moderate complexity    Rehab Potential Fair    PT Frequency 2x / week    PT Duration 12 weeks    PT Treatment/Interventions ADLs/Self Care Home Management;Cryotherapy;Electrical Stimulation;Moist Heat;Ultrasound;DME Instruction;Gait training;Stair training;Functional mobility training;Therapeutic exercise;Balance training;Patient/family education;Orthotic Fit/Training;Neuromuscular re-education;Wheelchair mobility training;Manual techniques;Passive range of motion;Dry needling;Energy conservation;Taping;Visual/perceptual remediation/compensation;Joint Manipulations    PT Next  Visit Plan core strength- sitting at EOM, postural control, sit to stand if able; Continue with progressive LE Strengthening.  Stand activities as appropriate    PT Home Exercise Plan No changes to HEP today    Consulted and Agree with Plan of Care Patient;Family member/caregiver    Family Member Consulted Mom             Louis Meckel, PT Physical Therapist- [facility]  [facility]  09/18/2022, 9:08 PM

## 2022-09-15 NOTE — Therapy (Signed)
Occupational Therapy Neuro Treatment Note  Patient Name: Paul Hall MRN: [id number] DOB:February 14, 1996, 27 y.o., male Today's Date: 09/15/2022  PCP: Dr. Sherwood Gambler REFERRING PROVIDER: Dr. Sherwood Gambler   OT End of Session - 09/15/22 0850     Visit Number [id number]    Number of Visits 60    Date for OT Re-Evaluation 11/07/22    OT Start Time 1650    OT Stop Time 1730    OT Time Calculation (min) 40 min    Activity Tolerance Patient tolerated treatment well    Behavior During Therapy WFL for tasks assessed/performed                     Past Medical History:  Diagnosis Date   Diabetes mellitus (HCC)    Hypertension    Stroke [facility])    Past Surgical History:  Procedure Laterality Date   IVC FILTER PLACEMENT ([facility] HX)     LEG SURGERY     PEG TUBE PLACEMENT     TRACHEOSTOMY     Patient Active Problem List   Diagnosis Date Noted   Sepsis due to vancomycin resistant Enterococcus species (HCC) 06/06/2019   SIRS (systemic inflammatory response syndrome) (HCC) 06/05/2019   Acute lower UTI 06/05/2019   VRE (vancomycin-resistant Enterococci) infection 06/05/2019   Anemia 06/05/2019   Skin ulcer of sacrum with necrosis of muscle (HCC)    Urinary retention    Type 2 diabetes mellitus without complication, with long-term current use of insulin (HCC)    Tachycardia    Lower extremity edema    Acute metabolic encephalopathy    Obstructive sleep apnea    Morbid obesity with BMI of 60.0-69.9, adult (HCC)    Goals of care, counseling/discussion    Palliative care encounter    Sepsis (HCC) 04/27/2019   H/O insulin dependent diabetes mellitus 04/27/2019   History of CVA with residual deficit 04/27/2019   Seizure disorder (HCC) 04/27/2019   Decubitus ulcer of sacral region, stage 4 (HCC) 04/27/2019    ONSET DATE: 01/2019  REFERRING DIAG: CVA/COVID-19  THERAPY DIAG:  Muscle weakness (generalized)  Other lack of coordination  Rationale for Evaluation and Treatment  Rehabilitation  SUBJECTIVE:   SUBJECTIVE STATEMENT:  Pt. /caregiver report the Pt. has abdominal issues today.  Pt accompanied by: self and family member  PERTINENT HISTORY:  Pt. is a 27 y.o. male who was diagnosed with COVID-19, and CVA with resultant quadriplegia. Pt. Was then hospitalized with VRE UTI. PMHx includes: urinary retention, seizure disorder, obstructive sleep disorder, DM Type II, Morbid obesity.   PRECAUTIONS: None  WEIGHT BEARING RESTRICTIONS No  PAIN:  Are you having pain? No  FALLS: Has patient fallen in last 6 months? No  LIVING ENVIRONMENT: Lives with: lives with their family Lives in: House/apartment Stairs: No Level Entry Has following equipment at home: Wheelchair (power) and hoyer lift, sit to stand lift  PLOF: Independent  PATIENT GOALS: To be able to engage in more daily care tasks.  OBJECTIVE:   HAND DOMINANCE: Right  ADLs:  Transfers/ambulation related to ADLs: Eating: Pt. reports being able to hold standard utensils, and is starting to engage more in self-feeding tasks, hand to mouth patterns. Pt. Reports that he does as much as he can with the task, and family assists  with the remainder of the task. Grooming: Pt. Is able to initiate holding an electric toothbrush, and brush his teeth. Family assists LB Dressing: Total Assist UE dressing: Pt. is now able to reach up  to actively assist with grasping , and pulling his gown down. Toileting:  Total Assist Bathing: MaxA UB, Total assist LB Tub Shower transfers: N/A Equipment: See above    IADLs: Shopping: Relies on family to assist Light housekeeping: Total Assist Meal Prep: Total Assist Community mobility:   Medication management:  Total Assist  Financial management: N/A Handwriting: Not legible: Pt. Is able to hold a pen with the left hand, and initiate marking the page. Pt.'s eye glasses were not available  MOBILITY STATUS:  Power w/c  POSTURE COMMENTS:  Pt. Requires position  changes in his power w/c  ACTIVITY TOLERANCE: Activity tolerance:  Fair  FUNCTIONAL OUTCOME MEASURES: FOTO: 40  TR score: 45  05/25/2022:   FOTO: 50 TR score: 45  07/20/22: FOTO 55  UPPER EXTREMITY ROM     Active ROM Right eval Right 03/21/2022 Right 05/25/2022 R  07/20/22 Right  07/25/22 Right 08/15/2022 Left eval Left  03/21/2022 Left 05/25/2022 L  07/20/22 Left 07/25/2022 Left  08/15/2022  Shoulder flexion 106 scaption 105  Scaption 62 flexion 110 scaption 100  105 119 123 125 130  136  Shoulder abduction 114 90 97 100  105 110 115 115 115  118  Shoulder adduction              Shoulder extension              Shoulder internal rotation              Shoulder external rotation              Elbow flexion 120(130) 145 145 140  144 135(135) 145 145   145  Elbow extension -45(-35) -22(-35) -28(-22)  -32(-21) -32(-28) -27(-18) -21(-20) -20(-16)  -25(-10) -23(-10)  Wrist flexion              Wrist extension -30(10) -25(10) -10(30)  -10(20) -10(25) 10(50) 19(50) 28(50)  30(50) 35(55)  Wrist ulnar deviation              Wrist radial deviation              Wrist pronation              Wrist supination   Limited by flexor tone      Limited by flexor tone     (Blank rows = not tested)      UPPER EXTREMITY MMT:     Right eval Right 03/21/2022 Right  05/25/2022 R 07/20/22 Right 08/25/2022 Left eval Left 03/21/2022 Left  05/25/2022 L 07/20/22 Left  08/15/2022  Shoulder flexion 3-/5 scaption 3-/5 3-/5 3+/5 3+/5 3-/5 3/5 3+/5 4+/5 4+/5  Shoulder abduction 3-/5 3-/5 3-/5 4/5 4/5 3-/5 3-/5 4-/5 4+/5 4+/5  Shoulder adduction            Shoulder extension            Shoulder internal rotation            Shoulder external rotation            Elbow flexion 3/5 3/5 3+/5 4/5 4+/5 3/5 3+/5 4-/5 4+/5 5/5  Elbow extension 2-/5 2/5 3-/5 4+/5 4+/5 2-/5 2/5  4+/5 4+/5  Wrist flexion            Wrist extension 2-/5 2-/5 2/5 2/5 2/5 2/5 2+/5 3-/5  3-/5  Wrist ulnar deviation            Wrist  radial deviation  Wrist pronation            Wrist supination            (Blank rows = not tested)     HAND FUNCTION:  Grip strength: Right: 0 lbs; Left: 0 lbs and Lateral pinch: Right: 5 lbs, Left: 2 lbs  03/21/2022: Lateral pinch: Right: 3.5 lbs, Left: 2 lbs  07/20/22: Grip strength: Right: 3 lbs; Left: 4 lbs and Lateral pinch: Right: 5 lbs, Left: 3 lbs  08/15/2022:  Grip strength: Right: 0 lbs; Left: 5 lbs and Lateral pinch: Right: 2 lbs, Left: 4 lbs  Bilateral digit PIP/DIP flexion contractures with MP hyperextension with attempts for AROM. Pt. is able to tolerate AROM to the bilateral digits at the initial evaluation however, has a history of pain in the digits.  COORDINATION:  Eval: Pt. is unable to grasp 9-hole test pegs. Pt. is able to initiate grasping larger pegs, and is able to hold a pen in the left hand.  07/20/22: 2 min 36 seconds to remove 9 pegs from 9 hole peg test - cues to locate pegs 2/2 low vision. Pt. is able to initiate grasping larger pegs on R hand and is able to hold a pen in the left hand.  08/15/2022:      Vision, and sensation limiting accuracy of 9 hole peg test results. Pt. Was able to grasp and remove vertical pegs intermittently with cues.    SENSATION: Light touch: Impaired   EDEMA:  N/A  MUSCLE TONE: BUE flexor Spasticity  COGNITION Overall cognitive status: Continue to assess in functional context  VISION:   Subjective report: Pt. was not wearing glasses at the time of the initial eval.  Baseline vision: Vision is very limited. Wears glasses all the time Visual history: History of impaired vision following CVA. Pt. Has received treatment through the [facility] low vision rehabilitation program.   VISION ASSESSMENT: Impaired To be further assessed in functional context  PERCEPTION: Impaired   PRAXIS: Impaired: motor planning  OBSERVATIONS:  Pt reports being on Tramadol   TODAY'S TREATMENT 08/25/2022   Therapeutic  Exercise:   Pt. tolerated slow prolonged gentle stretching in the RUE, and bilateral wrist flexion, extension, Digit MP, PIP, and DIP flexion, and extension. Emphasis was placed on slow prolonged gentle stretching of the bilateral hands, and digit extension at the tabletop in preparation for assuming a position for proprioception.     PATIENT EDUCATION: Education details: There. Halford Decamp Person educated: Patient and Parent Education method: Explanation, Demonstration, Tactile cues, and Verbal cues Education comprehension: verbalized understanding, returned demonstration, verbal cues required, and needs further education   HOME EXERCISE PROGRAM:   Continue ongoing assessment, and continue to provide as needed.     GOALS: Goals reviewed with patient? Yes  SHORT TERM GOALS: Target date:09/26/2022       To assess splint fit, and make appropriate adjustments to promote good skin integrity through the palmar surface of the bilateral hands.  Baseline: 05/25/22: Goal currently met, however ongoing as needs to assess splint fit arise. 03/23/2022: Pt. is wearing splints a couple of hours at night bilateral resting hand splints. 03/21/2022: Pt. is wearing splints a couple of hours at night bilateral resting hand splints. Goal status: Continue ongoing assessment as requested, and as needed   LONG TERM GOALS: Target date: 11/07/2022    FOTO score will Improve by 2 points for Pt. perceived improvement with the assessment specific ADL/IADL tasks.  Baseline: 07/20/2022: FOTO 55 05/25/2022: FOTO  score: 50,  TR score: 45 Eval: FOTO score: 40,  TR score: 45 Goal status: Achieved   2.   Pt. will independently perform oral care for 100% of the task after complete set-up. Baseline: 08/15/2022: Completes 90% of the task, proximal assist at times required at the elbow. 07/20/22: completes 90% of task, limited by shoulder flexion. 05/25/2022:  Pt. Is able to initiate and perform oral care for approximately  90% of the task. Complete set-up required. Assi needed only for the very back teeth. 03/23/2022: Pt. Is able to initiate and perform oral care for approximately 75% of the task. Pt. Requires assist at proximally at the elbow for through oral care. 03/21/2022: Pt. Is able to initiate and perform oral care for approximately 75% of the task. Pt. Requires assist at proximally at the elbow for through oral care. Eval: Pt. is able to initiate using an electric toothbrush. Pt. requires assist for set-up, and assist for thoroughness, and as he Pt. fatigues. Goal status: Ongoing  3.  Pt. Will be independent with self-feeding for 100% of the meal after complete set-up Baseline:08/15/2022:  90% using the spoon, assit at time with the forearm motion, and grip on the utensils. 100% for finger foods.  07/20/22: self-feeds cereal using spoon 90% of task. 05/25/2022: Pt. Is able to use a spoon to scoop cereal when feeding himself cereal 85% of the time. Pt. Is able to feed himself snack/finger foods 100% of the time. Pt. Continues to work on consistency of  stabilizing a cup/mug when drinking. Pt. Is able to grasp a water bottle with assist initially, with assist tapering off as he drinks.03/23/2022: Pt. Is able to perform scooping cereal for 75% of the time. Pt. required assist, and support at the left elbow, and Pt. Presents with limited forearm supination when using the spoon, and bringing it towards his mouth. Pt. Is able to use a fork to spear items, and perform the hand to mouth pattern.  03/21/2022: Pt. Is able to perform scooping cereal for 75% of the time. Pt. required assist, and support at the left elbow, and Pt. presents with limited forearm supination when using the spoon, and bringing it towards his mouth. Pt. Is able to use a fork to spear items, and perform the hand to mouth pattern.  Eval: Pt. is able to hold standard standard utensils. Pt. Performs as much of the task as he, can and has assistance for the  remainder. Goal status: Ongoing  4.  Pt. Will improve grasp patterns and consistently grasp 1/4" objects for ADL, and IADL tasks.  Baseline: 08/15/2022: grasps 1/4" pegs with min a + visual cues, consistently grasping 1/2" objects with visual cues. 07/20/22: grasps 1/4" pegs with min a + visual cues, consistently grasping 1/2" objects with visual cues. 05/25/2022: Pt. Is working on improving consistency of grasping 1/2" objects with visual cues.  03/23/2022: Pt. Is able to grasp 1" objects consistently,and continues to work on the hand patterns needed to grasp 1/2" objects.03/21/2022: Pt. Is able to grasp 1" objects consistently,and continues to work on the hand patterns needed to grasp 1/2" objects. Eval: Pt. is able to grasp 1" objects intermittently using a lateral grasp pattern. Goal status: Progressing; goal revised to 1/4" items  5.  Pt. will independently write his name legibly with letter sizes under 1". Baseline: 08/15/2022: Pt. Is able to write name with smaller letter size. Pt. Is signing name with a computer styus. Pt. Requires visual cues, and assist. 07/20/22: stabilizing assist  to write name with 75% legibility with 2" letters. 05/25/2022: Pt. to continue to work towards formulating grasp patterns in preparation for grasping a large width pen. Pt. Requires visual cues. 03/23/2022: Pt. Is able to write his name with modA, however has difficulty with formulating letter sizes less than 2" in size with 50% legibility for the 3 letters of his name.03/21/2022: Pt. Is able to write his name with modA, however has difficulty with formulating letter sizes less than 2" in size with 50% legibility for the 3 letters of his name. Eval: Pt is able to hold a thin marker with his left hand, and formulate a line, and initiate a circular pattern (Pt. without glasses today) Goal status: Progressing; Goal revised for more control with smaller writing size  6. Pt. Will reach up to comb/brush his hair  with minA.   Baseline: 5/13/202: 75% using a hair pick, assist to the right side of the head. 07/20/22: reaches 75% of head, assist for far R side of head, fatigues quickly. 05/25/2022: Pt. Is able to reach up with the left hand to the left side, top, and back of his head. 03/23/2022: Pt. is now able to more consistently initiate reaching up to his head with his left hand in preparation for haircare12/18/2023: Pt. is now able to more consistently initiate reaching up to his head with his left hand in preparation for haircare. Eval: Pt. is able to initiate reaching up for hair care with a long handled brush, however is unable to sustain UE's in elevation to perform the task.     Goal status: Ongoing  7. Pt. Will independently navigate the w/c through his environment with minA with visual scanning, and hand placement on the controls.  Baseline: 08/15/2022: Pt. Requires minA to setup hand on controls and MIN cues to navigate the w/c in wide spaces, requires MIN - MOD cues to navigate the w/c through more narrow doorways, and tighter turns - varies based on good vs bad vision days.07/20/22: Pt. Requires minA to setup hand on controls and MIN cues to navigate the w/c in wide spaces, requires MIN - MOD cues to navigate the w/c through more narrow doorways, and tighter turns - varies based on good vs bad vision days. 05/25/2022: Pt. Requires minA  and  cues to navigate the w/c in wide spaces, and requires Mod cues to navigate the w/c through more narrow doorways, and tighter turns. Pt. Requires max cues for scanning through the environment, and moderate cues for hand placement on the controls.     Goal status: Ongoing    8. Pt. Will improve bilateral grip strength to be able to independently grasp, and pull up blankets, and linens.      Baseline: 08/15/2022: Pt. Has difficulty securely holding, and pulling up blankets, and linens.    Goal status: New   9. Pt. Will consistently actively control the releasing  of blankets, covers,  and linens from his hands once they are in the desired position over him. Baseline: 08/15/2022: Pt. has difficulty with controlled releasing of blankets/linens from his grasp.     Goal status: New   ASSESSMENT:  CLINICAL IMPRESSION:  Pt. required multiple intervals of rest breaks 2/2 waves of abdominal cramping this afternoon. Pt. presented with increased flexor tone in the bilateral digit PIPs, and DIPs initially, however was able to achieve increased digit extension at the tabletop following slow prolonged gentle stretching. Pt./caregiver education was provided about ROM, as well as han, wrist, and digit  position. Pt. Required the hand placement at an elevated surface on the right to ensure a neutral wrist. Pt. continues to work on improving BUE functioning, ROM, strength, motor control, [facility] skills, and visual compensatory strategies to be able to independently pull up, and release blankets/covers, improve writing legibility, improve self-care including oral care, and utensil use during self-feeding tasks, and maximize independence with ADLs, and IADLs.     PERFORMANCE DEFICITS in functional skills including ADLs, IADLs, coordination, dexterity, proprioception, ROM, strength, pain, FMC, GMC, decreased knowledge of use of DME, and UE functional use, cognitive skills including safety awareness, and psychosocial skills including coping strategies, environmental adaptation, habits, and routines and behaviors.   IMPAIRMENTS are limiting patient from ADLs, IADLs, leisure, and social participation.   COMORBIDITIES may have co-morbidities  that affects occupational performance. Patient will benefit from skilled OT to address above impairments and improve overall function.  MODIFICATION OR ASSISTANCE TO COMPLETE EVALUATION: Maximum or significant modification of tasks or assist is necessary to complete an evaluation.  OT OCCUPATIONAL PROFILE AND HISTORY: Comprehensive assessment: Review of records and  extensive additional review of physical, cognitive, psychosocial history related to current functional performance.  CLINICAL DECISION MAKING: High - multiple treatment options, significant modification of task necessary  REHAB POTENTIAL: Fair    EVALUATION COMPLEXITY: High    PLAN: OT FREQUENCY: 2 x's a week  OT DURATION:12 weeks  PLANNED INTERVENTIONS: self care/ADL training, therapeutic exercise, therapeutic activity, neuromuscular re-education, manual therapy, passive range of motion, patient/family education, and cognitive remediation/compensation  RECOMMENDED OTHER SERVICES: PT  CONSULTED AND AGREED WITH PLAN OF CARE: Patient and family member/caregiver  PLAN FOR NEXT SESSION: Initiate treatment  Olegario Messier, MS, OTR/L  09/15/2022

## 2022-09-19 ENCOUNTER — Ambulatory Visit: Payer: BC Managed Care – PPO

## 2022-09-19 ENCOUNTER — Ambulatory Visit: Payer: BC Managed Care – PPO | Admitting: Occupational Therapy

## 2022-09-20 DIAGNOSIS — I6389 Other cerebral infarction: Secondary | ICD-10-CM | POA: Diagnosis not present

## 2022-09-20 DIAGNOSIS — L8994 Pressure ulcer of unspecified site, stage 4: Secondary | ICD-10-CM | POA: Diagnosis not present

## 2022-09-20 DIAGNOSIS — R32 Unspecified urinary incontinence: Secondary | ICD-10-CM | POA: Diagnosis not present

## 2022-09-21 ENCOUNTER — Ambulatory Visit: Payer: BC Managed Care – PPO | Admitting: Occupational Therapy

## 2022-09-21 ENCOUNTER — Ambulatory Visit: Payer: BC Managed Care – PPO

## 2022-09-21 DIAGNOSIS — R269 Unspecified abnormalities of gait and mobility: Secondary | ICD-10-CM

## 2022-09-21 DIAGNOSIS — R278 Other lack of coordination: Secondary | ICD-10-CM

## 2022-09-21 DIAGNOSIS — I6930 Unspecified sequelae of cerebral infarction: Secondary | ICD-10-CM

## 2022-09-21 DIAGNOSIS — R262 Difficulty in walking, not elsewhere classified: Secondary | ICD-10-CM

## 2022-09-21 DIAGNOSIS — M6281 Muscle weakness (generalized): Secondary | ICD-10-CM

## 2022-09-21 DIAGNOSIS — R2681 Unsteadiness on feet: Secondary | ICD-10-CM | POA: Diagnosis not present

## 2022-09-21 NOTE — Therapy (Signed)
Occupational Therapy Neuro Treatment Note  Patient Name: Paul Hall MRN: [id number] DOB:1995/10/16, 27 y.o., male Today's Date: 09/21/2022  PCP: Dr. Sherwood Gambler REFERRING PROVIDER: Dr. Sherwood Gambler   OT End of Session - 09/21/22 1756     Visit Number [id number]    Number of Visits 60    Date for OT Re-Evaluation 11/07/22    Authorization Type Progress report period starting 10/06/2021    OT Start Time 1650    OT Stop Time 1730    OT Time Calculation (min) 40 min    Activity Tolerance Patient tolerated treatment well    Behavior During Therapy WFL for tasks assessed/performed                     Past Medical History:  Diagnosis Date   Diabetes mellitus (HCC)    Hypertension    Stroke [facility])    Past Surgical History:  Procedure Laterality Date   IVC FILTER PLACEMENT ([facility] HX)     LEG SURGERY     PEG TUBE PLACEMENT     TRACHEOSTOMY     Patient Active Problem List   Diagnosis Date Noted   Sepsis due to vancomycin resistant Enterococcus species (HCC) 06/06/2019   SIRS (systemic inflammatory response syndrome) (HCC) 06/05/2019   Acute lower UTI 06/05/2019   VRE (vancomycin-resistant Enterococci) infection 06/05/2019   Anemia 06/05/2019   Skin ulcer of sacrum with necrosis of muscle (HCC)    Urinary retention    Type 2 diabetes mellitus without complication, with long-term current use of insulin (HCC)    Tachycardia    Lower extremity edema    Acute metabolic encephalopathy    Obstructive sleep apnea    Morbid obesity with BMI of 60.0-69.9, adult (HCC)    Goals of care, counseling/discussion    Palliative care encounter    Sepsis (HCC) 04/27/2019   H/O insulin dependent diabetes mellitus 04/27/2019   History of CVA with residual deficit 04/27/2019   Seizure disorder (HCC) 04/27/2019   Decubitus ulcer of sacral region, stage 4 (HCC) 04/27/2019    ONSET DATE: 01/2019  REFERRING DIAG: CVA/COVID-19  THERAPY DIAG:  Muscle weakness (generalized)  Rationale for  Evaluation and Treatment Rehabilitation  SUBJECTIVE:   SUBJECTIVE STATEMENT:  Pt. /caregiver report feeling better after having had a stomach bug last week.   Pt accompanied by: self and family member  PERTINENT HISTORY:  Pt. is a 27 y.o. male who was diagnosed with COVID-19, and CVA with resultant quadriplegia. Pt. Was then hospitalized with VRE UTI. PMHx includes: urinary retention, seizure disorder, obstructive sleep disorder, DM Type II, Morbid obesity.   PRECAUTIONS: None  WEIGHT BEARING RESTRICTIONS No  PAIN:  Are you having pain? No  FALLS: Has patient fallen in last 6 months? No  LIVING ENVIRONMENT: Lives with: lives with their family Lives in: House/apartment Stairs: No Level Entry Has following equipment at home: Wheelchair (power) and hoyer lift, sit to stand lift  PLOF: Independent  PATIENT GOALS: To be able to engage in more daily care tasks.  OBJECTIVE:   HAND DOMINANCE: Right  ADLs:  Transfers/ambulation related to ADLs: Eating: Pt. reports being able to hold standard utensils, and is starting to engage more in self-feeding tasks, hand to mouth patterns. Pt. Reports that he does as much as he can with the task, and family assists  with the remainder of the task. Grooming: Pt. Is able to initiate holding an electric toothbrush, and brush his teeth. Family assists LB Dressing: Total  Assist UE dressing: Pt. is now able to reach up to actively assist with grasping , and pulling his gown down. Toileting:  Total Assist Bathing: MaxA UB, Total assist LB Tub Shower transfers: N/A Equipment: See above    IADLs: Shopping: Relies on family to assist Light housekeeping: Total Assist Meal Prep: Total Assist Community mobility:   Medication management:  Total Assist  Financial management: N/A Handwriting: Not legible: Pt. Is able to hold a pen with the left hand, and initiate marking the page. Pt.'s eye glasses were not available  MOBILITY STATUS:  Power  w/c  POSTURE COMMENTS:  Pt. Requires position changes in his power w/c  ACTIVITY TOLERANCE: Activity tolerance:  Fair  FUNCTIONAL OUTCOME MEASURES: FOTO: 40  TR score: 45  05/25/2022:   FOTO: 50 TR score: 45  07/20/22: FOTO 55  UPPER EXTREMITY ROM     Active ROM Right eval Right 03/21/2022 Right 05/25/2022 R  07/20/22 Right  07/25/22 Right 08/15/2022 Left eval Left  03/21/2022 Left 05/25/2022 L  07/20/22 Left 07/25/2022 Left  08/15/2022  Shoulder flexion 106 scaption 105  Scaption 62 flexion 110 scaption 100  105 119 123 125 130  136  Shoulder abduction 114 90 97 100  105 110 115 115 115  118  Shoulder adduction              Shoulder extension              Shoulder internal rotation              Shoulder external rotation              Elbow flexion 120(130) 145 145 140  144 135(135) 145 145   145  Elbow extension -45(-35) -22(-35) -28(-22)  -32(-21) -32(-28) -27(-18) -21(-20) -20(-16)  -25(-10) -23(-10)  Wrist flexion              Wrist extension -30(10) -25(10) -10(30)  -10(20) -10(25) 10(50) 19(50) 28(50)  30(50) 35(55)  Wrist ulnar deviation              Wrist radial deviation              Wrist pronation              Wrist supination   Limited by flexor tone      Limited by flexor tone     (Blank rows = not tested)      UPPER EXTREMITY MMT:     Right eval Right 03/21/2022 Right  05/25/2022 R 07/20/22 Right 08/25/2022 Left eval Left 03/21/2022 Left  05/25/2022 L 07/20/22 Left  08/15/2022  Shoulder flexion 3-/5 scaption 3-/5 3-/5 3+/5 3+/5 3-/5 3/5 3+/5 4+/5 4+/5  Shoulder abduction 3-/5 3-/5 3-/5 4/5 4/5 3-/5 3-/5 4-/5 4+/5 4+/5  Shoulder adduction            Shoulder extension            Shoulder internal rotation            Shoulder external rotation            Elbow flexion 3/5 3/5 3+/5 4/5 4+/5 3/5 3+/5 4-/5 4+/5 5/5  Elbow extension 2-/5 2/5 3-/5 4+/5 4+/5 2-/5 2/5  4+/5 4+/5  Wrist flexion            Wrist extension 2-/5 2-/5 2/5 2/5 2/5 2/5 2+/5 3-/5   3-/5  Wrist ulnar deviation            Wrist radial  deviation            Wrist pronation            Wrist supination            (Blank rows = not tested)     HAND FUNCTION:  Grip strength: Right: 0 lbs; Left: 0 lbs and Lateral pinch: Right: 5 lbs, Left: 2 lbs  03/21/2022: Lateral pinch: Right: 3.5 lbs, Left: 2 lbs  07/20/22: Grip strength: Right: 3 lbs; Left: 4 lbs and Lateral pinch: Right: 5 lbs, Left: 3 lbs  08/15/2022:  Grip strength: Right: 0 lbs; Left: 5 lbs and Lateral pinch: Right: 2 lbs, Left: 4 lbs  Bilateral digit PIP/DIP flexion contractures with MP hyperextension with attempts for AROM. Pt. is able to tolerate AROM to the bilateral digits at the initial evaluation however, has a history of pain in the digits.  COORDINATION:  Eval: Pt. is unable to grasp 9-hole test pegs. Pt. is able to initiate grasping larger pegs, and is able to hold a pen in the left hand.  07/20/22: 2 min 36 seconds to remove 9 pegs from 9 hole peg test - cues to locate pegs 2/2 low vision. Pt. is able to initiate grasping larger pegs on R hand and is able to hold a pen in the left hand.  08/15/2022:      Vision, and sensation limiting accuracy of 9 hole peg test results. Pt. Was able to grasp and remove vertical pegs intermittently with cues.    SENSATION: Light touch: Impaired   EDEMA:  N/A  MUSCLE TONE: BUE flexor Spasticity  COGNITION Overall cognitive status: Continue to assess in functional context  VISION:   Subjective report: Pt. was not wearing glasses at the time of the initial eval.  Baseline vision: Vision is very limited. Wears glasses all the time Visual history: History of impaired vision following CVA. Pt. Has received [facility] low vision rehabilitation program.   VISION ASSESSMENT: Impaired To be further assessed in functional context  PERCEPTION: Impaired   PRAXIS: Impaired: motor planning  OBSERVATIONS:  Pt reports being on  Tramadol   TODAY'S TREATMENT 08/25/2022   Therapeutic Exercise:   Pt. tolerated slow prolonged gentle stretching in the RUE, and bilateral wrist flexion, extension, Digit MP, PIP, and DIP flexion, and extension. Emphasis was placed on slow prolonged gentle stretching of the bilateral hands, and digit extension at the tabletop in preparation for assuming a position for proprioception following moist heat to bilateral hands.     PATIENT EDUCATION: Education details: There. Halford Decamp Person educated: Patient and Parent Education method: Explanation, Demonstration, Tactile cues, and Verbal cues Education comprehension: verbalized understanding, returned demonstration, verbal cues required, and needs further education   HOME EXERCISE PROGRAM:   Continue ongoing assessment, and continue to provide as needed.     GOALS: Goals reviewed with patient? Yes  SHORT TERM GOALS: Target date:09/26/2022       To assess splint fit, and make appropriate adjustments to promote good skin integrity through the palmar surface of the bilateral hands.  Baseline: 05/25/22: Goal currently met, however ongoing as needs to assess splint fit arise. 03/23/2022: Pt. is wearing splints a couple of hours at night bilateral resting hand splints. 03/21/2022: Pt. is wearing splints a couple of hours at night bilateral resting hand splints. Goal status: Continue ongoing assessment as requested, and as needed   LONG TERM GOALS: Target date: 11/07/2022    FOTO score will Improve by 2  points for Pt. perceived improvement with the assessment specific ADL/IADL tasks.  Baseline: 07/20/2022: FOTO 55 05/25/2022: FOTO score: 50,  TR score: 45 Eval: FOTO score: 40,  TR score: 45 Goal status: Achieved   2.   Pt. will independently perform oral care for 100% of the task after complete set-up. Baseline: 08/15/2022: Completes 90% of the task, proximal assist at times required at the elbow. 07/20/22: completes 90% of task,  limited by shoulder flexion. 05/25/2022:  Pt. Is able to initiate and perform oral care for approximately 90% of the task. Complete set-up required. Assi needed only for the very back teeth. 03/23/2022: Pt. Is able to initiate and perform oral care for approximately 75% of the task. Pt. Requires assist at proximally at the elbow for through oral care. 03/21/2022: Pt. Is able to initiate and perform oral care for approximately 75% of the task. Pt. Requires assist at proximally at the elbow for through oral care. Eval: Pt. is able to initiate using an electric toothbrush. Pt. requires assist for set-up, and assist for thoroughness, and as he Pt. fatigues. Goal status: Ongoing  3.  Pt. Will be independent with self-feeding for 100% of the meal after complete set-up Baseline:08/15/2022:  90% using the spoon, assit at time with the forearm motion, and grip on the utensils. 100% for finger foods.  07/20/22: self-feeds cereal using spoon 90% of task. 05/25/2022: Pt. Is able to use a spoon to scoop cereal when feeding himself cereal 85% of the time. Pt. Is able to feed himself snack/finger foods 100% of the time. Pt. Continues to work on consistency of  stabilizing a cup/mug when drinking. Pt. Is able to grasp a water bottle with assist initially, with assist tapering off as he drinks.03/23/2022: Pt. Is able to perform scooping cereal for 75% of the time. Pt. required assist, and support at the left elbow, and Pt. Presents with limited forearm supination when using the spoon, and bringing it towards his mouth. Pt. Is able to use a fork to spear items, and perform the hand to mouth pattern.  03/21/2022: Pt. Is able to perform scooping cereal for 75% of the time. Pt. required assist, and support at the left elbow, and Pt. presents with limited forearm supination when using the spoon, and bringing it towards his mouth. Pt. Is able to use a fork to spear items, and perform the hand to mouth pattern.  Eval: Pt. is able to hold  standard standard utensils. Pt. Performs as much of the task as he, can and has assistance for the remainder. Goal status: Ongoing  4.  Pt. Will improve grasp patterns and consistently grasp 1/4" objects for ADL, and IADL tasks.  Baseline: 08/15/2022: grasps 1/4" pegs with min a + visual cues, consistently grasping 1/2" objects with visual cues. 07/20/22: grasps 1/4" pegs with min a + visual cues, consistently grasping 1/2" objects with visual cues. 05/25/2022: Pt. Is working on improving consistency of grasping 1/2" objects with visual cues.  03/23/2022: Pt. Is able to grasp 1" objects consistently,and continues to work on the hand patterns needed to grasp 1/2" objects.03/21/2022: Pt. Is able to grasp 1" objects consistently,and continues to work on the hand patterns needed to grasp 1/2" objects. Eval: Pt. is able to grasp 1" objects intermittently using a lateral grasp pattern. Goal status: Progressing; goal revised to 1/4" items  5.  Pt. will independently write his name legibly with letter sizes under 1". Baseline: 08/15/2022: Pt. Is able to write name with smaller letter  size. Pt. Is signing name with a computer styus. Pt. Requires visual cues, and assist. 07/20/22: stabilizing assist to write name with 75% legibility with 2" letters. 05/25/2022: Pt. to continue to work towards formulating grasp patterns in preparation for grasping a large width pen. Pt. Requires visual cues. 03/23/2022: Pt. Is able to write his name with modA, however has difficulty with formulating letter sizes less than 2" in size with 50% legibility for the 3 letters of his name.03/21/2022: Pt. Is able to write his name with modA, however has difficulty with formulating letter sizes less than 2" in size with 50% legibility for the 3 letters of his name. Eval: Pt is able to hold a thin marker with his left hand, and formulate a line, and initiate a circular pattern (Pt. without glasses today) Goal status: Progressing; Goal revised for  more control with smaller writing size  6. Pt. Will reach up to comb/brush his hair  with minA.  Baseline: 5/13/202: 75% using a hair pick, assist to the right side of the head. 07/20/22: reaches 75% of head, assist for far R side of head, fatigues quickly. 05/25/2022: Pt. Is able to reach up with the left hand to the left side, top, and back of his head. 03/23/2022: Pt. is now able to more consistently initiate reaching up to his head with his left hand in preparation for haircare12/18/2023: Pt. is now able to more consistently initiate reaching up to his head with his left hand in preparation for haircare. Eval: Pt. is able to initiate reaching up for hair care with a long handled brush, however is unable to sustain UE's in elevation to perform the task.     Goal status: Ongoing  7. Pt. Will independently navigate the w/c through his environment with minA with visual scanning, and hand placement on the controls.  Baseline: 08/15/2022: Pt. Requires minA to setup hand on controls and MIN cues to navigate the w/c in wide spaces, requires MIN - MOD cues to navigate the w/c through more narrow doorways, and tighter turns - varies based on good vs bad vision days.07/20/22: Pt. Requires minA to setup hand on controls and MIN cues to navigate the w/c in wide spaces, requires MIN - MOD cues to navigate the w/c through more narrow doorways, and tighter turns - varies based on good vs bad vision days. 05/25/2022: Pt. Requires minA  and  cues to navigate the w/c in wide spaces, and requires Mod cues to navigate the w/c through more narrow doorways, and tighter turns. Pt. Requires max cues for scanning through the environment, and moderate cues for hand placement on the controls.     Goal status: Ongoing    8. Pt. Will improve bilateral grip strength to be able to independently grasp, and pull up blankets, and linens.      Baseline: 08/15/2022: Pt. Has difficulty securely holding, and pulling up blankets, and  linens.    Goal status: New   9. Pt. Will consistently actively control the releasing  of blankets, covers, and linens from his hands once they are in the desired position over him. Baseline: 08/15/2022: Pt. has difficulty with controlled releasing of blankets/linens from his grasp.     Goal status: New   ASSESSMENT:  CLINICAL IMPRESSION:  Pt. presented with increased flexor tone in the bilateral digit PIPs, and DIPs initially, however was able to achieve increased digit extension at the tabletop following slow prolonged gentle stretching. Pt./caregiver education was provided about ROM, as well as  han, wrist, and digit position. Pt. Required the hand placement at an elevated surface on the right to ensure a neutral wrist. Pt. continues to work on improving BUE functioning, ROM, strength, motor control, [facility] skills, and visual compensatory strategies to be able to independently pull up, and release blankets/covers, improve writing legibility, improve self-care including oral care, and utensil use during self-feeding tasks, and maximize independence with ADLs, and IADLs.     PERFORMANCE DEFICITS in functional skills including ADLs, IADLs, coordination, dexterity, proprioception, ROM, strength, pain, FMC, GMC, decreased knowledge of use of DME, and UE functional use, cognitive skills including safety awareness, and psychosocial skills including coping strategies, environmental adaptation, habits, and routines and behaviors.   IMPAIRMENTS are limiting patient from ADLs, IADLs, leisure, and social participation.   COMORBIDITIES may have co-morbidities  that affects occupational performance. Patient will benefit from skilled OT to address above impairments and improve overall function.  MODIFICATION OR ASSISTANCE TO COMPLETE EVALUATION: Maximum or significant modification of tasks or assist is necessary to complete an evaluation.  OT OCCUPATIONAL PROFILE AND HISTORY: Comprehensive assessment: Review  of records and extensive additional review of physical, cognitive, psychosocial history related to current functional performance.  CLINICAL DECISION MAKING: High - multiple treatment options, significant modification of task necessary  REHAB POTENTIAL: Fair    EVALUATION COMPLEXITY: High    PLAN: OT FREQUENCY: 2 x's a week  OT DURATION:12 weeks  PLANNED INTERVENTIONS: self care/ADL training, therapeutic exercise, therapeutic activity, neuromuscular re-education, manual therapy, passive range of motion, patient/family education, and cognitive remediation/compensation  RECOMMENDED OTHER SERVICES: PT  CONSULTED AND AGREED WITH PLAN OF CARE: Patient and family member/caregiver  PLAN FOR NEXT SESSION: Initiate treatment  Olegario Messier, MS, OTR/L  09/21/2022

## 2022-09-22 NOTE — Therapy (Signed)
OUTPATIENT PHYSICAL THERAPY TREATMENT NOTE     Patient Name: Paul Hall MRN: [id number] DOB:1995/11/10, 27 y.o., male Today's Date: 09/22/2022  PCP:  Cindi Carbon PROVIDER:  Lenise Herald, PA-C   PT End of Session - 09/21/22 1359     Visit Number 107    Number of Visits 128    Date for PT Re-Evaluation 11/30/22    Authorization Type [employer]    Progress Note Due on Visit 110    PT Start Time 1602    PT Stop Time 1646    PT Time Calculation (min) 44 min    Activity Tolerance Patient tolerated treatment well    Behavior During Therapy WFL for tasks assessed/performed                            Past Medical History:  Diagnosis Date   Diabetes mellitus (HCC)    Hypertension    Stroke [facility])    Past Surgical History:  Procedure Laterality Date   IVC FILTER PLACEMENT ([facility] HX)     LEG SURGERY     PEG TUBE PLACEMENT     TRACHEOSTOMY     Patient Active Problem List   Diagnosis Date Noted   Sepsis due to vancomycin resistant Enterococcus species (HCC) 06/06/2019   SIRS (systemic inflammatory response syndrome) (HCC) 06/05/2019   Acute lower UTI 06/05/2019   VRE (vancomycin-resistant Enterococci) infection 06/05/2019   Anemia 06/05/2019   Skin ulcer of sacrum with necrosis of muscle (HCC)    Urinary retention    Type 2 diabetes mellitus without complication, with long-term current use of insulin (HCC)    Tachycardia    Lower extremity edema    Acute metabolic encephalopathy    Obstructive sleep apnea    Morbid obesity with BMI of 60.0-69.9, adult (HCC)    Goals of care, counseling/discussion    Palliative care encounter    Sepsis (HCC) 04/27/2019   H/O insulin dependent diabetes mellitus 04/27/2019   History of CVA with residual deficit 04/27/2019   Seizure disorder (HCC) 04/27/2019   Decubitus ulcer of sacral region, stage 4 (HCC) 04/27/2019    REFERRING DIAG: Cerebral infarction, unspecified   THERAPY DIAG:  Muscle weakness  (generalized)  Other lack of coordination  History of CVA with residual deficit  Difficulty in walking, not elsewhere classified  Abnormality of gait and mobility  Rationale for Evaluation and Treatment Rehabilitation  PERTINENT HISTORY: Paul Hall is a 25yoM who presents with severe weakness, quadriparesis, altered sensorium, and visual impairment s/p critical illness and prolonged hospitalization. Pt hospitalized in October 2020 with ARDS 2/2 COVID19 infection. Pt sustained a complex and lengthy hospitalization which included tracheostomy, prolonged sedation, ECMO. In this period pt sustained CVA and SDH. Pt has now been liberated from tracheostomy and G-tube. Pt has since been hospitalized for wound infection and UTI. Pt lives with parents at home, has hospital bed and left chair, hoyer lift transfers, and power WC for mobility needs. Pt needs heavy physical assistance with ADL 2/2 BUE contractures and motor dysfunction   PRECAUTIONS: Fall  SUBJECTIVE: Patient reports not feeling well- states stomach is upset- requesting not to transfer out of chair today.        PAIN:   Are you having pain? No   TODAY'S TREATMENT:  - 09/14/2022   *Dependent hoyer transfer with assist of PT/Patient mother from power w/c to middle of large mat table (wedge +2 pillows)   THERAPEUTIC EXERCISE:  Resistive Knee ext (2.5#) 2 sets of 10 reps each LE Resistive Hip flex (2.5#) 2 sets of 10 reps each LE Resistive knee flex (2.5#) 2 sets of 10 reps each LE Resistive hip abd (2.5#) x 10 (increased time for minimal right LE activity)  Resistive ankle PF (toe pad on 1/2 foam) 2.5# AW 2sets of 10 reps Static sit to stand x 2 - Max A+2 with forearm support and then approx 30-40 sec static stand today with max VC for posture and to extend glutes  Dependent hoyer transfer with assist of PT/Patient mother from large mat table (wedge +2 pillows) to power w/c    Pt educated throughout session about  proper posture and technique with exercises. Improved exercise technique, movement at target joints, use of target muscles after min to mod verbal, visual, tactile cues.       PATIENT EDUCATION: Education details: standing posture and exercise technique Person educated: Patient Education method: Explanation, Demonstration, Tactile cues, and Verbal cues Education comprehension: verbalized understanding, returned demonstration, verbal cues required, tactile cues required, and needs further education   HOME EXERCISE PROGRAM:  Access Code: [email or web address] URL: [email or web address] Date: 03/23/2022 Prepared by: Maureen Ralphs  Exercises - Supine Bridge  - 3 x weekly - 3 sets - 10 reps - 2 hold - Supine Gluteal Sets  - 3 x weekly - 3 sets - 10 reps - 5 sec hold - Supine Quad Set  - 1 x daily - 3 x weekly - 3 sets - 10 reps - 5 hold - Seated Long Arc Quad  - 1 x daily - 7 x weekly - 3 sets - 10 reps - Seated Hip Adduction Squeeze with Ball  - 1 x daily - 3 x weekly - 3 sets - 10 reps - 5 hold - Seated Hip Abduction  - 1 x daily - 3 x weekly - 3 sets - 10 reps - 2 hold    PT Short Term Goals -       PT SHORT TERM GOAL #1   Title Pt will be independent with HEP in order to improve strength and balance in order to decrease fall risk and improve function at home and work.    Baseline 01/04/2021= No formal HEP in place; 12/12 no HEP in place; 05/10/2021-Patient and his father were able to report compliance with curent HEP consisting of mostly seated/reclined LE strengthening. Both verbalize no questions at this time.    Time 6    Period Weeks    Status Achieved    Target Date 02/15/21              PT Long Term Goals - Date for completion: 06/01/2022         PT LONG TERM GOAL #1   Title Patient will increase BLE gross strength by 1/2 muscle grade to improve functional strength for improved independence with potential gait, increased standing tolerance and increased ADL  ability.    Baseline 01/04/2021- Patient presents with 1/5 to 3-/5 B LE strength with MMT; 12/12: goal partially met for Left knee/hip; 05/10/2021= 2-/5 bilateal Hip flex; 3+/5 bilateral Knee ext; 06/21/2021= Patient presents with 2-/5 bilateral Hip flex; 3+/5 bilateral knee ext/flex; 2-/5 left ankle DF; 0/5 right ankle- and able to increase reps and resistance with LE's. 09/15/2021- Patient technically presents with 2-/5 B hip flex/abd/add - but he is able to raise his hip up to approx 100 deg which has improved. 3+/5 Bilateral knee ext, 2-/5 left ankle and 0/5  right ankle.  12/08/2021= Patient able to lift left knee at 110 deg of hip flex; presents with 3+/5 knee ext, 2-/5 left ankle DF and 0/5 right ankle DF, 2-/5 bilateral Hip abd in seated position.   12/6: R: knee 3+/5 ext, 2/5 flexion, left knee 3+/5 extension, 3+/5 flexion, R hip: 2+/5 hip add, 2+/5 hip ABD L hip: 4-/5 hip ABD, 3+/5 hip ADD, 3+/5 hip flexion; 06/06/2022= Patient now presents with 2-/5 right ankle DF/PF;    Time 12    Period Weeks    Status MET   Target Date 03/07/2022     PT LONG TERM GOAL #2   Title Patient will tolerate sitting unsupported demonstrating erect sitting posture for 15 minutes with CGA to demonstrate improved back extensor strength and improved sitting tolerance.    Baseline 01/04/2021- Patient confied to sitting in lift chair or electric power chair with back support and unable to sit upright without physical assistance; 12/12: tolerates <1 minutes upright unsupported sitting. 05/10/2021=static sit with forward trunk lean  in his power wheelchair without back support x approx 3 min. 06/21/2021=Unable to assess today due to patient with acute back pain but on previous visit able to sit x 8 min without back support. 09/15/2021- on last visit- 09/13/2021- patient was able to sit unsupported x 8 min at edge of mat. 10/13/2023 - Patient was able to sit at edge of mat with varying level of assist today from SBA to min A for a total of  20 min. 12/13/2021= Patient demonstrated unsupported sitting at edge of mat for approx 20 min   Time 12    Period Weeks    Status GOAL MET   Target Date 12/08/21      PT LONG TERM GOAL #3   Title Patient will demonstrate ability to perform static standing in // bars > 2 min with Max Assist  without loss of balance and fair posture for improved overall strength for pre-gait and transfer activities.    Baseline 01/04/2021= Patient current uanble to stand- Dependent on hoyer or sit to stand lift for transfers. 05/10/2021=Not appropriate yet- Currently still dependent with all transfers using hoyer. 06/21/2021= Patient continuing now to focus on LE strengthening to prepare for standing-unable to try today due to acute low back pain-  planning on attempting in new cert period. 09/15/2021- Patient has attempted standing 2x in past two week- max Assist of 2 people - only once was he successful to clearing his bottom from chair - Will continue to be a focus during the new certification. 12/13/2021= Patient has been limited secondary to increased overall low back pain during this certification and will require more time to focus on this goal.  12/6: not assessed this date, will assess at date when 2-3 PTs are present for assistance    Time 12    Period Weeks    Status GOAL not appropriate at this time - may attempt in future once Patient presents with improved overall LE strength.          PT LONG TERM GOAL #4   Title Pt will improve FOTO score by 10 points or more demonstrating improved perceived functional ability    Baseline FOTO 7 on 10/17; 03/15/21: FOTO 12; 05/10/2021 06/21/2021= 1; 09/15/2021= 9; 12/13/2021= Will issue next visit 12/6: 4; 06/06/2022= Will assess next visit   Time 12    Period Weeks    Status ONGOING   Target date 08/29/2022     PT LONG TERM GOAL #  5   Title Patient will perform sit to stand transfer with appropriate AD and max assist of 2 people with 75% consistency to prepare for pregait  activities.    Baseline 09/15/2021= Patient unable to stand well- unable to clear his bottom off chair with Max assist of 2 persons. 12/13/2021- Goal not appropriate to try yet but will keep and roll over to next cert as shift continues to focus on transfers/standing; 4/24= Patient able to perform active ankle DF/PF with right LE and able to raise his knee into seated march and clear floor without physical assist today - as previously unable as well as lift right knee ext to near full ROM to improve strength for eventual standing.    Time 12    Period Weeks    Status Goal still not appropriate for now but will keep active for future   Target date    PT LONG TERM GOAL #6  Title Patient will tolerate sitting unsupported demonstrating erect sitting posture for 30 minutes with CGA to demonstrate improved back extensor strength and improved sitting tolerance.   Baseline 12/13/2021= Patient demonstrated unsupported sitting at edge of mat for approx 20 min;  12/29/2021- Patient performed approx 30 min of dynamic sitting activities today. 06/06/2022= Patient demonstrated ability to sit and perform static and dynamic UE/LE movement with only Supervision.   Time 12   Period Weeks   Status GOAL MET         7.  Patient will tolerate 5 minutes or more of standing in sit to stand lift in order to indicate improved lower extremity weightbearing tolerance for progression to standing in parallel bars. Baseline: 1 minute on most recent stand 02/21/22; 06/06/2022- Patient did attempt today after complaining of right LE pain last week. He attempted 3 stands using sit to stand lift. - all over 1 min- last one approx 48 sec- stopped due to fatigue. More erect standing in lift today - still poor gluteal strength but able to activate glutes and extend hips upon command but unable to hold > 5 sec. 07/28/2022- Will attempt standing next visit but patient has been able to stand since last progress note for up to 3 min at a time  at his best.  Goal status: ONGOING- Progressing Target date: 08/29/2022  8. Patient will tolerate sitting unsupported demonstrating ability to perform dynamic UE/LE activities for 30 minutes  independently to demonstrate ability to sit at edge of bed to eat or perform some ADL's/exercise for optimal quality of life.  Baseline: 06/06/2022- Patient able to sit and perform some UE/LE exercises but requires CGA at times for safety- he is able to static sit for 30 min with supervision. 07/27/2022= Patient able to now sit > 30 min with Supervision only - performing static and dynamic activities. Will keep goal active to ensure patient is consistent.   Goal Status: PROGRESSING  Target date: 08/29/2022       Plan     Clinical Impression Statement Patient continues to demonstrate progress- able perform not only left LE but right LE with resistance which is great improvement as just recently he required AAROM to move Right LE. He continues with trials of standing with assist of 2 persons and maintains great effort throughout sessions. Pt will continue to benefit from skilled physical therapy intervention to address impairments, improve QOL, and attain therapy goals.    Personal Factors and Comorbidities Comorbidity 3+;Time since onset of injury/illness/exacerbation    Comorbidities CVA, diabetes, Seizures  Examination-Activity Limitations Bathing;Bed Mobility;Bend;Caring for Others;Carry;Dressing;Hygiene/Grooming;Lift;Locomotion Level;Reach Overhead;Self Feeding;Sit;Squat;Stairs;Stand;Transfers;Toileting    Examination-Participation Restrictions Cleaning;Community Activity;Driving;Laundry;Medication Management;Meal Prep;Occupation;Personal Finances;Shop;Yard Work;Volunteer    Stability/Clinical Decision Making Evolving/Moderate complexity    Rehab Potential Fair    PT Frequency 2x / week    PT Duration 12 weeks    PT Treatment/Interventions ADLs/Self Care Home Management;Cryotherapy;Electrical  Stimulation;Moist Heat;Ultrasound;DME Instruction;Gait training;Stair training;Functional mobility training;Therapeutic exercise;Balance training;Patient/family education;Orthotic Fit/Training;Neuromuscular re-education;Wheelchair mobility training;Manual techniques;Passive range of motion;Dry needling;Energy conservation;Taping;Visual/perceptual remediation/compensation;Joint Manipulations    PT Next Visit Plan core strength- sitting at EOM, postural control, sit to stand if able; Continue with progressive LE Strengthening. Stand activities as appropriate    PT Home Exercise Plan No changes to HEP today    Consulted and Agree with Plan of Care Patient;Family member/caregiver    Family Member Consulted Mom             Louis Meckel, PT Physical Therapist- [facility]  09/22/2022, 2:00 PM

## 2022-09-23 DIAGNOSIS — G4733 Obstructive sleep apnea (adult) (pediatric): Secondary | ICD-10-CM | POA: Diagnosis not present

## 2022-09-26 ENCOUNTER — Ambulatory Visit: Payer: BC Managed Care – PPO

## 2022-09-26 ENCOUNTER — Ambulatory Visit: Payer: BC Managed Care – PPO | Admitting: Occupational Therapy

## 2022-09-26 DIAGNOSIS — R2681 Unsteadiness on feet: Secondary | ICD-10-CM | POA: Diagnosis not present

## 2022-09-26 DIAGNOSIS — M6281 Muscle weakness (generalized): Secondary | ICD-10-CM

## 2022-09-26 DIAGNOSIS — R278 Other lack of coordination: Secondary | ICD-10-CM

## 2022-09-26 DIAGNOSIS — R262 Difficulty in walking, not elsewhere classified: Secondary | ICD-10-CM

## 2022-09-26 DIAGNOSIS — I6930 Unspecified sequelae of cerebral infarction: Secondary | ICD-10-CM | POA: Diagnosis not present

## 2022-09-26 DIAGNOSIS — R269 Unspecified abnormalities of gait and mobility: Secondary | ICD-10-CM

## 2022-09-26 NOTE — Therapy (Unsigned)
Occupational Therapy Progress/Recertification Note  Dates of reporting period  07/20/2022   to   09/26/2022   Patient Name: Paul Hall MRN: [id number] DOB:[birth year], 27 y.o., male Today's Date: 09/26/2022  PCP: Dr. Sherwood Gambler REFERRING PROVIDER: Dr. Sherwood Gambler   OT End of Session - 09/26/22 1653     Visit Number 40    Number of Visits 60    Date for OT Re-Evaluation 12/19/22    OT Start Time 1650    OT Stop Time 1730    OT Time Calculation (min) 40 min    Activity Tolerance Patient tolerated treatment well    Behavior During Therapy WFL for tasks assessed/performed                     Past Medical History:  Diagnosis Date   Diabetes mellitus (HCC)    Hypertension    Stroke [facility])    Past Surgical History:  Procedure Laterality Date   IVC FILTER PLACEMENT ([facility] HX)     LEG SURGERY     PEG TUBE PLACEMENT     TRACHEOSTOMY     Patient Active Problem List   Diagnosis Date Noted   Sepsis due to vancomycin resistant Enterococcus species (HCC) 06/06/2019   SIRS (systemic inflammatory response syndrome) (HCC) 06/05/2019   Acute lower UTI 06/05/2019   VRE (vancomycin-resistant Enterococci) infection 06/05/2019   Anemia 06/05/2019   Skin ulcer of sacrum with necrosis of muscle (HCC)    Urinary retention    Type 2 diabetes mellitus without complication, with long-term current use of insulin (HCC)    Tachycardia    Lower extremity edema    Acute metabolic encephalopathy    Obstructive sleep apnea    Morbid obesity with BMI of 60.0-69.9, adult (HCC)    Goals of care, counseling/discussion    Palliative care encounter    Sepsis (HCC) 04/27/2019   H/O insulin dependent diabetes mellitus 04/27/2019   History of CVA with residual deficit 04/27/2019   Seizure disorder (HCC) 04/27/2019   Decubitus ulcer of sacral region, stage 4 (HCC) 04/27/2019    ONSET DATE: 01/2019  REFERRING DIAG: CVA/COVID-19  THERAPY DIAG:  Muscle weakness (generalized)  Rationale for  Evaluation and Treatment Rehabilitation  SUBJECTIVE:   SUBJECTIVE STATEMENT:  Pt. Reports doing well today.  Pt accompanied by: self and family member  PERTINENT HISTORY:  Pt. is a 27 y.o. male who was diagnosed with COVID-19, and CVA with resultant quadriplegia. Pt. Was then hospitalized with VRE UTI. PMHx includes: urinary retention, seizure disorder, obstructive sleep disorder, DM Type II, Morbid obesity.   PRECAUTIONS: None  WEIGHT BEARING RESTRICTIONS No  PAIN:  Are you having pain? No  FALLS: Has patient fallen in last 6 months? No  LIVING ENVIRONMENT: Lives with: lives with their family Lives in: House/apartment Stairs: No Level Entry Has following equipment at home: Wheelchair (power) and hoyer lift, sit to stand lift  PLOF: Independent  PATIENT GOALS: To be able to engage in more daily care tasks.  OBJECTIVE:   HAND DOMINANCE: Right  ADLs:  Transfers/ambulation related to ADLs: Eating: Pt. reports being able to hold standard utensils, and is starting to engage more in self-feeding tasks, hand to mouth patterns. Pt. Reports that he does as much as he can with the task, and family assists  with the remainder of the task. Grooming: Pt. Is able to initiate holding an electric toothbrush, and brush his teeth. Family assists LB Dressing: Total Assist UE dressing: Pt. is now  able to reach up to actively assist with grasping , and pulling his gown down. Toileting:  Total Assist Bathing: MaxA UB, Total assist LB Tub Shower transfers: N/A Equipment: See above    IADLs: Shopping: Relies on family to assist Light housekeeping: Total Assist Meal Prep: Total Assist Community mobility:   Medication management:  Total Assist  Financial management: N/A Handwriting: Not legible: Pt. Is able to hold a pen with the left hand, and initiate marking the page. Pt.'s eye glasses were not available  MOBILITY STATUS:  Power w/c  POSTURE COMMENTS:  Pt. Requires position  changes in his power w/c  ACTIVITY TOLERANCE: Activity tolerance:  Fair  FUNCTIONAL OUTCOME MEASURES: FOTO: 40  TR score: 45  05/25/2022:   FOTO: 50 TR score: 45  07/20/22: FOTO 55  UPPER EXTREMITY ROM     Active ROM Right eval Right 03/21/2022 Right 05/25/2022 R  07/20/22 Right  07/25/22 Right 08/15/2022 Right 09/26/2022 Left eval Left  03/21/2022 Left 05/25/2022 L  07/20/22 Left 07/25/2022 Left  08/15/2022 Left  09/26/2022  Shoulder flexion 106 scaption 105  Scaption 62 flexion 110 scaption 100  105 105 119 123 125 130  136 138  Shoulder abduction 114 90 97 100  105 117 110 115 115 115  118 121  Shoulder adduction                Shoulder extension                Shoulder internal rotation                Shoulder external rotation                Elbow flexion 120(130) 145 145 140  144 140 135(135) 145 145   145   Elbow extension -45(-35) -22(-35) -28(-22)  -32(-21) -32(-28) -28(-26) -27(-18) -21(-20) -20(-16)  -25(-10) -23(-10) -21(-10)  Wrist flexion                Wrist extension -30(10) -25(10) -10(30)  -10(20) -10(25) -20(40) 10(50) 19(50) 28(50)  30(50) 35(55) 36(55)  Wrist ulnar deviation                Wrist radial deviation                Wrist pronation                Wrist supination   Limited by flexor tone       Limited by flexor tone      (Blank rows = not tested)      UPPER EXTREMITY MMT:     Right eval Right 03/21/2022 Right  05/25/2022 R 07/20/22 Right 08/25/2022 Right 09/26/2022 Left eval Left 03/21/2022 Left  05/25/2022 L 07/20/22 Left  08/15/2022 Left 09/26/2022  Shoulder flexion 3-/5 scaption 3-/5 3-/5 3+/5 3+/5 3+/5 3-/5 3/5 3+/5 4+/5 4+/5 4+/5  Shoulder abduction 3-/5 3-/5 3-/5 4/5 4/5 4/5 3-/5 3-/5 4-/5 4+/5 4+/5 4+5  Shoulder adduction              Shoulder extension              Shoulder internal rotation              Shoulder external rotation              Elbow flexion 3/5 3/5 3+/5 4/5 4+/5 4+/5 3/5 3+/5 4-/5 4+/5 5/5 5/5  Elbow extension  2-/5 2/5 3-/5 4+/5 4+/5 4+/5 2-/5 2/5  4+/5 4+/5 4+/5  Wrist flexion              Wrist extension 2-/5 2-/5 2/5 2/5 2/5 2/5 2/5 2+/5 3-/5  3-/5 3-/5  Wrist ulnar deviation              Wrist radial deviation              Wrist pronation              Wrist supination              (Blank rows = not tested)     HAND FUNCTION:  Grip strength: Right: 0 lbs; Left: 0 lbs and Lateral pinch: Right: 5 lbs, Left: 2 lbs  03/21/2022: Lateral pinch: Right: 3.5 lbs, Left: 2 lbs  07/20/22: Grip strength: Right: 3 lbs; Left: 4 lbs and Lateral pinch: Right: 5 lbs, Left: 3 lbs  08/15/2022:  Grip strength: Right: 0 lbs; Left: 5 lbs and Lateral pinch: Right: 2 lbs, Left: 4 lbs  09/26/2022  Grip strength: Right: 0 lbs; Left: 8 lbs and Lateral pinch: Right: 2 lbs, Left: 5 lbs   Bilateral digit PIP/DIP flexion contractures with MP hyperextension with attempts for AROM. Pt. is able to tolerate AROM to the bilateral digits at the initial evaluation however, has a history of pain in the digits.  COORDINATION:  Eval: Pt. is unable to grasp 9-hole test pegs. Pt. is able to initiate grasping larger pegs, and is able to hold a pen in the left hand.  07/20/22: 2 min 36 seconds to remove 9 pegs from 9 hole peg test - cues to locate pegs 2/2 low vision. Pt. is able to initiate grasping larger pegs on R hand and is able to hold a pen in the left hand.  08/15/2022:      Vision, and sensation limiting accuracy of 9 hole peg test results. Pt. Was able to grasp and remove vertical pegs intermittently with cues.    SENSATION: Light touch: Impaired   EDEMA:  N/A  MUSCLE TONE: BUE flexor Spasticity  COGNITION Overall cognitive status: Continue to assess in functional context  VISION:   Subjective report: Pt. was not wearing glasses at the time of the initial eval.  Baseline vision: Vision is very limited. Wears glasses all the time Visual history: History of impaired vision following CVA. Pt. Has received  treatment through the [facility] low vision rehabilitation program.   VISION ASSESSMENT: Impaired To be further assessed in functional context  PERCEPTION: Impaired   PRAXIS: Impaired: motor planning  OBSERVATIONS:  Pt reports being on Tramadol   TODAY'S TREATMENT 08/25/2022    Measurements were obtained, and goals were reviewed with the Pt.      PATIENT EDUCATION: Education details: There. Halford Decamp Person educated: Patient and Parent Education method: Explanation, Demonstration, Tactile cues, and Verbal cues Education comprehension: verbalized understanding, returned demonstration, verbal cues required, and needs further education   HOME EXERCISE PROGRAM:   Continue ongoing assessment, and continue to provide as needed.     GOALS: Goals reviewed with patient? Yes  SHORT TERM GOALS: Target date: 11/07/2022         To assess splint fit, and make appropriate adjustments to promote good skin integrity through the palmar surface of the bilateral hands.  Baseline: 05/25/22: Goal currently met, however ongoing as needs to assess splint fit arise. 03/23/2022: Pt. is wearing splints a couple of hours at night bilateral resting hand splints. 03/21/2022: Pt. is wearing splints a  couple of hours at night bilateral resting hand splints. Goal status: Continue ongoing assessment as requested, and as needed   LONG TERM GOALS: Target date: 12/19/2022      FOTO score will Improve by 2 points for Pt. perceived improvement with the assessment specific ADL/IADL tasks.  Baseline: 09/27/2022: 51 07/20/2022: FOTO 55 05/25/2022: FOTO score: 50,  TR score: 45 Eval: FOTO score: 40,  TR score: 45 Goal status: Achieved   2.   Pt. will independently perform oral care for 100% of the task after complete set-up. Baseline: 09/26/2022: Completes 90% of the task, proximal assist at times required at the elbow.  08/15/2022: Completes 90% of the task, proximal assist at times required at the elbow. 07/20/22:  completes 90% of task, limited by shoulder flexion. 05/25/2022:  Pt. Is able to initiate and perform oral care for approximately 90% of the task. Complete set-up required. Assi needed only for the very back teeth. 03/23/2022: Pt. Is able to initiate and perform oral care for approximately 75% of the task. Pt. Requires assist at proximally at the elbow for through oral care. 03/21/2022: Pt. Is able to initiate and perform oral care for approximately 75% of the task. Pt. Requires assist at proximally at the elbow for through oral care. Eval: Pt. is able to initiate using an electric toothbrush. Pt. requires assist for set-up, and assist for thoroughness, and as he Pt. fatigues. Goal status: Ongoing  3.  Pt. Will be independent with self-feeding for 100% of the meal after complete set-up Baseline:09/26/2022: 90% using the spoon, assit at time with the forearm motion, and grip on the utensils. 100% for finger foods.  08/15/2022:  90% using the spoon, assit at time with the forearm motion, and grip on the utensils. 100% for finger foods-independent with set-up- unsupervised.  07/20/22: self-feeds cereal using spoon 90% of task. 05/25/2022: Pt. Is able to use a spoon to scoop cereal when feeding himself cereal 85% of the time. Pt. Is able to feed himself snack/finger foods 100% of the time. Pt. Continues to work on consistency of  stabilizing a cup/mug when drinking. Pt. Is able to grasp a water bottle with assist initially, with assist tapering off as he drinks.03/23/2022: Pt. Is able to perform scooping cereal for 75% of the time. Pt. required assist, and support at the left elbow, and Pt. Presents with limited forearm supination when using the spoon, and bringing it towards his mouth. Pt. Is able to use a fork to spear items, and perform the hand to mouth pattern.  03/21/2022: Pt. Is able to perform scooping cereal for 75% of the time. Pt. required assist, and support at the left elbow, and Pt. presents with limited  forearm supination when using the spoon, and bringing it towards his mouth. Pt. Is able to use a fork to spear items, and perform the hand to mouth pattern.  Eval: Pt. is able to hold standard standard utensils. Pt. Performs as much of the task as he, can and has assistance for the remainder. Goal status: Ongoing  4.  Pt. Will improve grasp patterns and consistently grasp 1/4" objects for ADL, and IADL tasks.  Baseline: 09/26/2022: Continues to grasp 1/4" pegs with min a + visual cues, consistently grasping 1/2" objects with visual cues. 08/15/2022: grasps 1/4" pegs with min a + visual cues, consistently grasping 1/2" objects with visual cues. 07/20/22: grasps 1/4" pegs with min a + visual cues, consistently grasping 1/2" objects with visual cues. 05/25/2022: Pt. Is working on  improving consistency of grasping 1/2" objects with visual cues.  03/23/2022: Pt. Is able to grasp 1" objects consistently,and continues to work on the hand patterns needed to grasp 1/2" objects.03/21/2022: Pt. Is able to grasp 1" objects consistently,and continues to work on the hand patterns needed to grasp 1/2" objects. Eval: Pt. is able to grasp 1" objects intermittently using a lateral grasp pattern. Goal status: Progressing; goal revised to 1/4" items  5.  Pt. will independently write his name legibly with letter sizes under 1". Baseline: 09/26/2022: Pt. Continues to be able to write name with smaller letter size. Pt. Is signing name with a computer styus. Pt. Requires visual cues, and assist. 08/15/2022: Pt. Is able to write name with smaller letter size. Pt. Is signing name with a computer styus. Pt. Requires visual cues, and assist. 07/20/22: stabilizing assist to write name with 75% legibility with 2" letters. 05/25/2022: Pt. to continue to work towards formulating grasp patterns in preparation for grasping a large width pen. Pt. Requires visual cues. 03/23/2022: Pt. Is able to write his name with modA, however has difficulty with  formulating letter sizes less than 2" in size with 50% legibility for the 3 letters of his name.03/21/2022: Pt. Is able to write his name with modA, however has difficulty with formulating letter sizes less than 2" in size with 50% legibility for the 3 letters of his name. Eval: Pt is able to hold a thin marker with his left hand, and formulate a line, and initiate a circular pattern (Pt. without glasses today) Goal status: Progressing; Goal revised for more control with smaller writing size  6. Pt. Will reach up to comb/brush his hair  with minA.  Baseline: 09/26/2022: Pt. Is able to use a hair pick for the left side of his head using his left hand. Pt. Presents with difficulty reaching to the right side of his head. 5/13/202: 75% using a hair pick, assist to the right side of the head. 07/20/22: reaches 75% of head, assist for far R side of head, fatigues quickly. 05/25/2022: Pt. Is able to reach up with the left hand to the left side, top, and back of his head. 03/23/2022: Pt. is now able to more consistently initiate reaching up to his head with his left hand in preparation for haircare12/18/2023: Pt. is now able to more consistently initiate reaching up to his head with his left hand in preparation for haircare. Eval: Pt. is able to initiate reaching up for hair care with a long handled brush, however is unable to sustain UE's in elevation to perform the task.     Goal status: Ongoing  7. Pt. Will independently navigate the w/c through his environment with minA with visual scanning, and hand placement on the controls.  Baseline: 09/26/2022: Pt. Is now navigating his w/c ouside the home, and around his block. 08/15/2022: Pt. Requires minA to setup hand on controls and MIN cues to navigate the w/c in wide spaces, requires MIN - MOD cues to navigate the w/c through more narrow doorways, and tighter turns - varies based on good vs bad vision days.07/20/22: Pt. Requires minA to setup hand on controls and MIN cues  to navigate the w/c in wide spaces, requires MIN - MOD cues to navigate the w/c through more narrow doorways, and tighter turns - varies based on good vs bad vision days. 05/25/2022: Pt. Requires minA  and  cues to navigate the w/c in wide spaces, and requires Mod cues to navigate the w/c  through more narrow doorways, and tighter turns. Pt. Requires max cues for scanning through the environment, and moderate cues for hand placement on the controls.     Goal status: Ongoing    8. Pt. Will improve bilateral grip strength to be able to independently grasp, and pull up blankets, and linens.      Baseline: 09/26/2022: R: 0  L: 8# Pt. is attempting to grasp, and pull blankets up more, using mostly the left hand. 08/15/2022: Pt. has difficulty securely holding, and pulling up blankets, and linens.    Goal status: Ongoing   9. Pt. will consistently actively control the releasing  of blankets, covers, and linens from his hands once they are in the desired position over him. Baseline: 09/26/2022: Pt. Is improving with actively releasing blankets form his hands. 08/15/2022: Pt. has difficulty with controlled releasing of blankets/linens from his grasp.     Goal status: Ongoing    10. Pt. Will improve bilateral UE strength by 2 MM grades to assist with ADLs, and IADLs     Baseline: Right: shoulder flexion: 3+/5, abduction: 4/5, elbow flexion: 4+/5 , extension: 4+/5 wrist extension: 2/5; Left: shoulder flexion: 4+/5, abduction: 4+/5, elbow flexion: 5/5 , extension: 4+/5 wrist extension: 3-/5   ASSESSMENT:  CLINICAL IMPRESSION:  Measurements were obtained, and goals were reviewed with the Pt. Pt. Has made progress  with bilateral shoulder ROM, and left grip strength.  Pt. Is making progress overall with self feeding skills, self-grooming skills, and actively releasing a blanket from his grip more consistently. Pt. Was provided with a swivel spoon to decrease spillage when attempting to use. Pt. continues to work on  improving BUE functioning, ROM, strength, motor control, [facility] skills, and visual compensatory strategies to be able to independently pull up, and release blankets/covers, improve writing legibility, improve self-care including oral care, and utensil use during self-feeding tasks, and maximize independence with ADLs, and IADLs.     PERFORMANCE DEFICITS in functional skills including ADLs, IADLs, coordination, dexterity, proprioception, ROM, strength, pain, FMC, GMC, decreased knowledge of use of DME, and UE functional use, cognitive skills including safety awareness, and psychosocial skills including coping strategies, environmental adaptation, habits, and routines and behaviors.   IMPAIRMENTS are limiting patient from ADLs, IADLs, leisure, and social participation.   COMORBIDITIES may have co-morbidities  that affects occupational performance. Patient will benefit from skilled OT to address above impairments and improve overall function.  MODIFICATION OR ASSISTANCE TO COMPLETE EVALUATION: Maximum or significant modification of tasks or assist is necessary to complete an evaluation.  OT OCCUPATIONAL PROFILE AND HISTORY: Comprehensive assessment: Review of records and extensive additional review of physical, cognitive, psychosocial history related to current functional performance.  CLINICAL DECISION MAKING: High - multiple treatment options, significant modification of task necessary  REHAB POTENTIAL: Fair    EVALUATION COMPLEXITY: High    PLAN: OT FREQUENCY: 2 x's a week  OT DURATION:12 weeks  PLANNED INTERVENTIONS: self care/ADL training, therapeutic exercise, therapeutic activity, neuromuscular re-education, manual therapy, passive range of motion, patient/family education, and cognitive remediation/compensation  RECOMMENDED OTHER SERVICES: PT  CONSULTED AND AGREED WITH PLAN OF CARE: Patient and family member/caregiver  PLAN FOR NEXT SESSION: Initiate treatment  Olegario Messier,  MS, OTR/L  09/26/2022

## 2022-09-27 NOTE — Therapy (Signed)
OUTPATIENT PHYSICAL THERAPY TREATMENT NOTE     Patient Name: Paul Hall MRN: [id number] DOB:1996/01/12, 27 y.o., male Today's Date: 09/27/2022  PCP:  Cindi Carbon PROVIDER:  Lenise Herald, PA-C   PT End of Session - 09/26/22 1421     Visit Number 108    Number of Visits 128    Date for PT Re-Evaluation 11/30/22    Authorization Type [employer]    Progress Note Due on Visit 110    PT Start Time 1602    PT Stop Time 1646    PT Time Calculation (min) 44 min    Activity Tolerance Patient tolerated treatment well    Behavior During Therapy WFL for tasks assessed/performed                            Past Medical History:  Diagnosis Date   Diabetes mellitus (HCC)    Hypertension    Stroke [facility])    Past Surgical History:  Procedure Laterality Date   IVC FILTER PLACEMENT ([facility] HX)     LEG SURGERY     PEG TUBE PLACEMENT     TRACHEOSTOMY     Patient Active Problem List   Diagnosis Date Noted   Sepsis due to vancomycin resistant Enterococcus species (HCC) 06/06/2019   SIRS (systemic inflammatory response syndrome) (HCC) 06/05/2019   Acute lower UTI 06/05/2019   VRE (vancomycin-resistant Enterococci) infection 06/05/2019   Anemia 06/05/2019   Skin ulcer of sacrum with necrosis of muscle (HCC)    Urinary retention    Type 2 diabetes mellitus without complication, with long-term current use of insulin (HCC)    Tachycardia    Lower extremity edema    Acute metabolic encephalopathy    Obstructive sleep apnea    Morbid obesity with BMI of 60.0-69.9, adult (HCC)    Goals of care, counseling/discussion    Palliative care encounter    Sepsis (HCC) 04/27/2019   H/O insulin dependent diabetes mellitus 04/27/2019   History of CVA with residual deficit 04/27/2019   Seizure disorder (HCC) 04/27/2019   Decubitus ulcer of sacral region, stage 4 (HCC) 04/27/2019    REFERRING DIAG: Cerebral infarction, unspecified   THERAPY DIAG:  Muscle weakness  (generalized)  Other lack of coordination  History of CVA with residual deficit  Difficulty in walking, not elsewhere classified  Abnormality of gait and mobility  Rationale for Evaluation and Treatment Rehabilitation  PERTINENT HISTORY: Rodger Giangregorio is a 25yoM who presents with severe weakness, quadriparesis, altered sensorium, and visual impairment s/p critical illness and prolonged hospitalization. Pt hospitalized in October 2020 with ARDS 2/2 COVID19 infection. Pt sustained a complex and lengthy hospitalization which included tracheostomy, prolonged sedation, ECMO. In this period pt sustained CVA and SDH. Pt has now been liberated from tracheostomy and G-tube. Pt has since been hospitalized for wound infection and UTI. Pt lives with parents at home, has hospital bed and left chair, hoyer lift transfers, and power WC for mobility needs. Pt needs heavy physical assistance with ADL 2/2 BUE contractures and motor dysfunction   PRECAUTIONS: Fall  SUBJECTIVE: Patient reports feeling better today and states has been on his feet more over the weekend     PAIN:   Are you having pain? No   TODAY'S TREATMENT:  - 09/27/2022   *Dependent hoyer transfer with assist of PT/Patient mother from power w/c to middle of large mat table (wedge +2 pillows)   THERAPEUTIC EXERCISE:    Active Knee  ext -2 sets of 10 reps each LE Active Hip flex 2 sets of 10 reps each LE Dynamic lateral weight shift- trying to add weight through hands - x 20 reps each side  Static sit to stand x 2 - Max A+2 with forearm support and then approx 20- 30 sec static stand today with max VC for posture and to extend glutes. Patient with much improved ability on 2nd trial- able to stand up more erect.   Dependent hoyer transfer with assist of PT/Patient mother from large mat table (wedge +2 pillows) to power w/c    Pt educated throughout session about proper posture and technique with exercises. Improved exercise  technique, movement at target joints, use of target muscles after min to mod verbal, visual, tactile cues.       PATIENT EDUCATION: Education details: standing posture and exercise technique Person educated: Patient Education method: Explanation, Demonstration, Tactile cues, and Verbal cues Education comprehension: verbalized understanding, returned demonstration, verbal cues required, tactile cues required, and needs further education   HOME EXERCISE PROGRAM:  Access Code: [email or web address] URL: [email or web address] Date: 03/23/2022 Prepared by: Maureen Ralphs  Exercises - Supine Bridge  - 3 x weekly - 3 sets - 10 reps - 2 hold - Supine Gluteal Sets  - 3 x weekly - 3 sets - 10 reps - 5 sec hold - Supine Quad Set  - 1 x daily - 3 x weekly - 3 sets - 10 reps - 5 hold - Seated Long Arc Quad  - 1 x daily - 7 x weekly - 3 sets - 10 reps - Seated Hip Adduction Squeeze with Ball  - 1 x daily - 3 x weekly - 3 sets - 10 reps - 5 hold - Seated Hip Abduction  - 1 x daily - 3 x weekly - 3 sets - 10 reps - 2 hold    PT Short Term Goals -       PT SHORT TERM GOAL #1   Title Pt will be independent with HEP in order to improve strength and balance in order to decrease fall risk and improve function at home and work.    Baseline 01/04/2021= No formal HEP in place; 12/12 no HEP in place; 05/10/2021-Patient and his father were able to report compliance with curent HEP consisting of mostly seated/reclined LE strengthening. Both verbalize no questions at this time.    Time 6    Period Weeks    Status Achieved    Target Date 02/15/21              PT Long Term Goals - Date for completion: 06/01/2022         PT LONG TERM GOAL #1   Title Patient will increase BLE gross strength by 1/2 muscle grade to improve functional strength for improved independence with potential gait, increased standing tolerance and increased ADL ability.    Baseline 01/04/2021- Patient presents with 1/5 to  3-/5 B LE strength with MMT; 12/12: goal partially met for Left knee/hip; 05/10/2021= 2-/5 bilateal Hip flex; 3+/5 bilateral Knee ext; 06/21/2021= Patient presents with 2-/5 bilateral Hip flex; 3+/5 bilateral knee ext/flex; 2-/5 left ankle DF; 0/5 right ankle- and able to increase reps and resistance with LE's. 09/15/2021- Patient technically presents with 2-/5 B hip flex/abd/add - but he is able to raise his hip up to approx 100 deg which has improved. 3+/5 Bilateral knee ext, 2-/5 left ankle and 0/5 right ankle.  12/08/2021= Patient able to lift left knee  at 110 deg of hip flex; presents with 3+/5 knee ext, 2-/5 left ankle DF and 0/5 right ankle DF, 2-/5 bilateral Hip abd in seated position.   12/6: R: knee 3+/5 ext, 2/5 flexion, left knee 3+/5 extension, 3+/5 flexion, R hip: 2+/5 hip add, 2+/5 hip ABD L hip: 4-/5 hip ABD, 3+/5 hip ADD, 3+/5 hip flexion; 06/06/2022= Patient now presents with 2-/5 right ankle DF/PF;    Time 12    Period Weeks    Status MET   Target Date 03/07/2022     PT LONG TERM GOAL #2   Title Patient will tolerate sitting unsupported demonstrating erect sitting posture for 15 minutes with CGA to demonstrate improved back extensor strength and improved sitting tolerance.    Baseline 01/04/2021- Patient confied to sitting in lift chair or electric power chair with back support and unable to sit upright without physical assistance; 12/12: tolerates <1 minutes upright unsupported sitting. 05/10/2021=static sit with forward trunk lean  in his power wheelchair without back support x approx 3 min. 06/21/2021=Unable to assess today due to patient with acute back pain but on previous visit able to sit x 8 min without back support. 09/15/2021- on last visit- 09/13/2021- patient was able to sit unsupported x 8 min at edge of mat. 10/13/2023 - Patient was able to sit at edge of mat with varying level of assist today from SBA to min A for a total of 20 min. 12/13/2021= Patient demonstrated unsupported sitting at  edge of mat for approx 20 min   Time 12    Period Weeks    Status GOAL MET   Target Date 12/08/21      PT LONG TERM GOAL #3   Title Patient will demonstrate ability to perform static standing in // bars > 2 min with Max Assist  without loss of balance and fair posture for improved overall strength for pre-gait and transfer activities.    Baseline 01/04/2021= Patient current uanble to stand- Dependent on hoyer or sit to stand lift for transfers. 05/10/2021=Not appropriate yet- Currently still dependent with all transfers using hoyer. 06/21/2021= Patient continuing now to focus on LE strengthening to prepare for standing-unable to try today due to acute low back pain-  planning on attempting in new cert period. 09/15/2021- Patient has attempted standing 2x in past two week- max Assist of 2 people - only once was he successful to clearing his bottom from chair - Will continue to be a focus during the new certification. 12/13/2021= Patient has been limited secondary to increased overall low back pain during this certification and will require more time to focus on this goal.  12/6: not assessed this date, will assess at date when 2-3 PTs are present for assistance    Time 12    Period Weeks    Status GOAL not appropriate at this time - may attempt in future once Patient presents with improved overall LE strength.          PT LONG TERM GOAL #4   Title Pt will improve FOTO score by 10 points or more demonstrating improved perceived functional ability    Baseline FOTO 7 on 10/17; 03/15/21: FOTO 12; 05/10/2021 06/21/2021= 1; 09/15/2021= 9; 12/13/2021= Will issue next visit 12/6: 4; 06/06/2022= Will assess next visit   Time 12    Period Weeks    Status ONGOING   Target date 08/29/2022     PT LONG TERM GOAL #5   Title Patient will perform sit to stand  transfer with appropriate AD and max assist of 2 people with 75% consistency to prepare for pregait activities.    Baseline 09/15/2021= Patient unable to stand well-  unable to clear his bottom off chair with Max assist of 2 persons. 12/13/2021- Goal not appropriate to try yet but will keep and roll over to next cert as shift continues to focus on transfers/standing; 4/24= Patient able to perform active ankle DF/PF with right LE and able to raise his knee into seated march and clear floor without physical assist today - as previously unable as well as lift right knee ext to near full ROM to improve strength for eventual standing.    Time 12    Period Weeks    Status Goal still not appropriate for now but will keep active for future   Target date    PT LONG TERM GOAL #6  Title Patient will tolerate sitting unsupported demonstrating erect sitting posture for 30 minutes with CGA to demonstrate improved back extensor strength and improved sitting tolerance.   Baseline 12/13/2021= Patient demonstrated unsupported sitting at edge of mat for approx 20 min;  12/29/2021- Patient performed approx 30 min of dynamic sitting activities today. 06/06/2022= Patient demonstrated ability to sit and perform static and dynamic UE/LE movement with only Supervision.   Time 12   Period Weeks   Status GOAL MET         7.  Patient will tolerate 5 minutes or more of standing in sit to stand lift in order to indicate improved lower extremity weightbearing tolerance for progression to standing in parallel bars. Baseline: 1 minute on most recent stand 02/21/22; 06/06/2022- Patient did attempt today after complaining of right LE pain last week. He attempted 3 stands using sit to stand lift. - all over 1 min- last one approx 48 sec- stopped due to fatigue. More erect standing in lift today - still poor gluteal strength but able to activate glutes and extend hips upon command but unable to hold > 5 sec. 07/28/2022- Will attempt standing next visit but patient has been able to stand since last progress note for up to 3 min at a time at his best.  Goal status: ONGOING- Progressing Target date:  08/29/2022  8. Patient will tolerate sitting unsupported demonstrating ability to perform dynamic UE/LE activities for 30 minutes  independently to demonstrate ability to sit at edge of bed to eat or perform some ADL's/exercise for optimal quality of life.  Baseline: 06/06/2022- Patient able to sit and perform some UE/LE exercises but requires CGA at times for safety- he is able to static sit for 30 min with supervision. 07/27/2022= Patient able to now sit > 30 min with Supervision only - performing static and dynamic activities. Will keep goal active to ensure patient is consistent.   Goal Status: PROGRESSING  Target date: 08/29/2022       Plan     Clinical Impression Statement Patient presents with great effort again today. He was able to participate well with seated therex and attempted standing again with increased difficulty extending glutes but improved with VC's on 2nd trial.  Pt will continue to benefit from skilled physical therapy intervention to address impairments, improve QOL, and attain therapy goals.    Personal Factors and Comorbidities Comorbidity 3+;Time since onset of injury/illness/exacerbation    Comorbidities CVA, diabetes, Seizures    Examination-Activity Limitations Bathing;Bed Mobility;Bend;Caring for Others;Carry;Dressing;Hygiene/Grooming;Lift;Locomotion Level;Reach Overhead;Self Feeding;Sit;Squat;Stairs;Stand;Transfers;Toileting    Examination-Participation Restrictions Cleaning;Community Activity;Driving;Laundry;Medication Management;Meal Prep;Occupation;Personal Finances;Shop;Yard Work;Volunteer  Stability/Clinical Decision Making Evolving/Moderate complexity    Rehab Potential Fair    PT Frequency 2x / week    PT Duration 12 weeks    PT Treatment/Interventions ADLs/Self Care Home Management;Cryotherapy;Electrical Stimulation;Moist Heat;Ultrasound;DME Instruction;Gait training;Stair training;Functional mobility training;Therapeutic exercise;Balance  training;Patient/family education;Orthotic Fit/Training;Neuromuscular re-education;Wheelchair mobility training;Manual techniques;Passive range of motion;Dry needling;Energy conservation;Taping;Visual/perceptual remediation/compensation;Joint Manipulations    PT Next Visit Plan core strength- sitting at EOM, postural control, sit to stand if able; Continue with progressive LE Strengthening. Stand activities as appropriate    PT Home Exercise Plan No changes to HEP today    Consulted and Agree with Plan of Care Patient;Family member/caregiver    Family Member Consulted Mom             Louis Meckel, PT Physical Therapist- [facility]  [facility]  09/27/2022, 2:30 PM

## 2022-09-28 ENCOUNTER — Ambulatory Visit: Payer: BC Managed Care – PPO | Admitting: Occupational Therapy

## 2022-09-28 ENCOUNTER — Ambulatory Visit: Payer: BC Managed Care – PPO

## 2022-10-01 ENCOUNTER — Other Ambulatory Visit: Payer: Self-pay | Admitting: Dermatology

## 2022-10-01 DIAGNOSIS — L910 Hypertrophic scar: Secondary | ICD-10-CM

## 2022-10-03 ENCOUNTER — Ambulatory Visit: Payer: BC Managed Care – PPO

## 2022-10-03 ENCOUNTER — Ambulatory Visit: Payer: BC Managed Care – PPO | Admitting: Occupational Therapy

## 2022-10-05 ENCOUNTER — Ambulatory Visit: Payer: BC Managed Care – PPO | Admitting: Occupational Therapy

## 2022-10-05 ENCOUNTER — Ambulatory Visit: Payer: BC Managed Care – PPO

## 2022-10-05 DIAGNOSIS — E1165 Type 2 diabetes mellitus with hyperglycemia: Secondary | ICD-10-CM | POA: Diagnosis not present

## 2022-10-05 DIAGNOSIS — I6389 Other cerebral infarction: Secondary | ICD-10-CM | POA: Diagnosis not present

## 2022-10-05 DIAGNOSIS — J0100 Acute maxillary sinusitis, unspecified: Secondary | ICD-10-CM | POA: Diagnosis not present

## 2022-10-05 DIAGNOSIS — Z993 Dependence on wheelchair: Secondary | ICD-10-CM | POA: Diagnosis not present

## 2022-10-05 DIAGNOSIS — Z9229 Personal history of other drug therapy: Secondary | ICD-10-CM | POA: Diagnosis not present

## 2022-10-05 DIAGNOSIS — I10 Essential (primary) hypertension: Secondary | ICD-10-CM | POA: Diagnosis not present

## 2022-10-05 DIAGNOSIS — G4733 Obstructive sleep apnea (adult) (pediatric): Secondary | ICD-10-CM | POA: Diagnosis not present

## 2022-10-10 ENCOUNTER — Ambulatory Visit: Payer: BC Managed Care – PPO | Admitting: Occupational Therapy

## 2022-10-10 ENCOUNTER — Ambulatory Visit: Payer: BC Managed Care – PPO | Attending: Physician Assistant

## 2022-10-10 DIAGNOSIS — R278 Other lack of coordination: Secondary | ICD-10-CM | POA: Insufficient documentation

## 2022-10-10 DIAGNOSIS — R262 Difficulty in walking, not elsewhere classified: Secondary | ICD-10-CM | POA: Diagnosis not present

## 2022-10-10 DIAGNOSIS — R269 Unspecified abnormalities of gait and mobility: Secondary | ICD-10-CM | POA: Insufficient documentation

## 2022-10-10 DIAGNOSIS — M6281 Muscle weakness (generalized): Secondary | ICD-10-CM | POA: Diagnosis not present

## 2022-10-10 DIAGNOSIS — I6930 Unspecified sequelae of cerebral infarction: Secondary | ICD-10-CM | POA: Insufficient documentation

## 2022-10-10 DIAGNOSIS — R2681 Unsteadiness on feet: Secondary | ICD-10-CM | POA: Insufficient documentation

## 2022-10-11 NOTE — Therapy (Signed)
OUTPATIENT PHYSICAL THERAPY TREATMENT NOTE     Patient Name: Paul Hall MRN: [id number] DOB:June 06, 1995, 27 y.o., male Today's Date: 10/11/2022  PCP:  Cindi Carbon PROVIDER:  Lenise Herald, PA-C   PT End of Session - 10/10/22 1009     Visit Number 109    Number of Visits 128    Date for PT Re-Evaluation 11/30/22    Authorization Type [employer]    Progress Note Due on Visit 110    PT Start Time 1533    PT Stop Time 1614    PT Time Calculation (min) 41 min    Activity Tolerance Patient tolerated treatment well    Behavior During Therapy WFL for tasks assessed/performed                            Past Medical History:  Diagnosis Date   Diabetes mellitus (HCC)    Hypertension    Stroke (HCC)    Past Surgical History:  Procedure Laterality Date   IVC FILTER PLACEMENT ([facility] HX)     LEG SURGERY     PEG TUBE PLACEMENT     TRACHEOSTOMY     Patient Active Problem List   Diagnosis Date Noted   Sepsis due to vancomycin resistant Enterococcus species (HCC) 06/06/2019   SIRS (systemic inflammatory response syndrome) (HCC) 06/05/2019   Acute lower UTI 06/05/2019   VRE (vancomycin-resistant Enterococci) infection 06/05/2019   Anemia 06/05/2019   Skin ulcer of sacrum with necrosis of muscle (HCC)    Urinary retention    Type 2 diabetes mellitus without complication, with long-term current use of insulin (HCC)    Tachycardia    Lower extremity edema    Acute metabolic encephalopathy    Obstructive sleep apnea    Morbid obesity with BMI of 60.0-69.9, adult (HCC)    Goals of care, counseling/discussion    Palliative care encounter    Sepsis (HCC) 04/27/2019   H/O insulin dependent diabetes mellitus 04/27/2019   History of CVA with residual deficit 04/27/2019   Seizure disorder (HCC) 04/27/2019   Decubitus ulcer of sacral region, stage 4 (HCC) 04/27/2019    REFERRING DIAG: Cerebral infarction, unspecified   THERAPY DIAG:  Muscle weakness  (generalized)  Other lack of coordination  History of CVA with residual deficit  Difficulty in walking, not elsewhere classified  Abnormality of gait and mobility  Unsteadiness on feet  Rationale for Evaluation and Treatment Rehabilitation  PERTINENT HISTORY: Paul Hall is a 25yoM who presents with severe weakness, quadriparesis, altered sensorium, and visual impairment s/p critical illness and prolonged hospitalization. Pt hospitalized in October 2020 with ARDS 2/2 COVID19 infection. Pt sustained a complex and lengthy hospitalization which included tracheostomy, prolonged sedation, ECMO. In this period pt sustained CVA and SDH. Pt has now been liberated from tracheostomy and G-tube. Pt has since been hospitalized for wound infection and UTI. Pt lives with parents at home, has hospital bed and left chair, hoyer lift transfers, and power WC for mobility needs. Pt needs heavy physical assistance with ADL 2/2 BUE contractures and motor dysfunction   PRECAUTIONS: Fall  SUBJECTIVE: Patient reports feeling better after being out for more than a week with stomach and respiratory illness. Requested to just perform seated stretching and therex today due to still recovering.      PAIN:   Are you having pain? No   TODAY'S TREATMENT:  - 10/10/2022     THERAPEUTIC EXERCISE:    PROM to B  LE- Hip rotators, hip abd, add, knee flex/ext and ankle x 22 min total Active Knee ext -2 sets of 10 reps each LE Active Hip flex 2 sets of 10 reps each LE  Manual resistance leg press x 10 reps each LE   Pt educated throughout session about proper posture and technique with exercises. Improved exercise technique, movement at target joints, use of target muscles after min to mod verbal, visual, tactile cues.       PATIENT EDUCATION: Education details: standing posture and exercise technique Person educated: Patient Education method: Explanation, Demonstration, Tactile cues, and Verbal  cues Education comprehension: verbalized understanding, returned demonstration, verbal cues required, tactile cues required, and needs further education   HOME EXERCISE PROGRAM:  Access Code: [email or web address] URL: [email or web address] Date: 03/23/2022 Prepared by: Maureen Ralphs  Exercises - Supine Bridge  - 3 x weekly - 3 sets - 10 reps - 2 hold - Supine Gluteal Sets  - 3 x weekly - 3 sets - 10 reps - 5 sec hold - Supine Quad Set  - 1 x daily - 3 x weekly - 3 sets - 10 reps - 5 hold - Seated Long Arc Quad  - 1 x daily - 7 x weekly - 3 sets - 10 reps - Seated Hip Adduction Squeeze with Ball  - 1 x daily - 3 x weekly - 3 sets - 10 reps - 5 hold - Seated Hip Abduction  - 1 x daily - 3 x weekly - 3 sets - 10 reps - 2 hold    PT Short Term Goals -       PT SHORT TERM GOAL #1   Title Pt will be independent with HEP in order to improve strength and balance in order to decrease fall risk and improve function at home and work.    Baseline 01/04/2021= No formal HEP in place; 12/12 no HEP in place; 05/10/2021-Patient and his father were able to report compliance with curent HEP consisting of mostly seated/reclined LE strengthening. Both verbalize no questions at this time.    Time 6    Period Weeks    Status Achieved    Target Date 02/15/21              PT Long Term Goals - Date for completion: 06/01/2022         PT LONG TERM GOAL #1   Title Patient will increase BLE gross strength by 1/2 muscle grade to improve functional strength for improved independence with potential gait, increased standing tolerance and increased ADL ability.    Baseline 01/04/2021- Patient presents with 1/5 to 3-/5 B LE strength with MMT; 12/12: goal partially met for Left knee/hip; 05/10/2021= 2-/5 bilateal Hip flex; 3+/5 bilateral Knee ext; 06/21/2021= Patient presents with 2-/5 bilateral Hip flex; 3+/5 bilateral knee ext/flex; 2-/5 left ankle DF; 0/5 right ankle- and able to increase reps and  resistance with LE's. 09/15/2021- Patient technically presents with 2-/5 B hip flex/abd/add - but he is able to raise his hip up to approx 100 deg which has improved. 3+/5 Bilateral knee ext, 2-/5 left ankle and 0/5 right ankle.  12/08/2021= Patient able to lift left knee at 110 deg of hip flex; presents with 3+/5 knee ext, 2-/5 left ankle DF and 0/5 right ankle DF, 2-/5 bilateral Hip abd in seated position.   12/6: R: knee 3+/5 ext, 2/5 flexion, left knee 3+/5 extension, 3+/5 flexion, R hip: 2+/5 hip add, 2+/5 hip ABD L hip: 4-/5 hip ABD, 3+/5  hip ADD, 3+/5 hip flexion; 06/06/2022= Patient now presents with 2-/5 right ankle DF/PF;    Time 12    Period Weeks    Status MET   Target Date 03/07/2022     PT LONG TERM GOAL #2   Title Patient will tolerate sitting unsupported demonstrating erect sitting posture for 15 minutes with CGA to demonstrate improved back extensor strength and improved sitting tolerance.    Baseline 01/04/2021- Patient confied to sitting in lift chair or electric power chair with back support and unable to sit upright without physical assistance; 12/12: tolerates <1 minutes upright unsupported sitting. 05/10/2021=static sit with forward trunk lean  in his power wheelchair without back support x approx 3 min. 06/21/2021=Unable to assess today due to patient with acute back pain but on previous visit able to sit x 8 min without back support. 09/15/2021- on last visit- 09/13/2021- patient was able to sit unsupported x 8 min at edge of mat. 10/13/2023 - Patient was able to sit at edge of mat with varying level of assist today from SBA to min A for a total of 20 min. 12/13/2021= Patient demonstrated unsupported sitting at edge of mat for approx 20 min   Time 12    Period Weeks    Status GOAL MET   Target Date 12/08/21      PT LONG TERM GOAL #3   Title Patient will demonstrate ability to perform static standing in // bars > 2 min with Max Assist  without loss of balance and fair posture for improved  overall strength for pre-gait and transfer activities.    Baseline 01/04/2021= Patient current uanble to stand- Dependent on hoyer or sit to stand lift for transfers. 05/10/2021=Not appropriate yet- Currently still dependent with all transfers using hoyer. 06/21/2021= Patient continuing now to focus on LE strengthening to prepare for standing-unable to try today due to acute low back pain-  planning on attempting in new cert period. 09/15/2021- Patient has attempted standing 2x in past two week- max Assist of 2 people - only once was he successful to clearing his bottom from chair - Will continue to be a focus during the new certification. 12/13/2021= Patient has been limited secondary to increased overall low back pain during this certification and will require more time to focus on this goal.  12/6: not assessed this date, will assess at date when 2-3 PTs are present for assistance    Time 12    Period Weeks    Status GOAL not appropriate at this time - may attempt in future once Patient presents with improved overall LE strength.          PT LONG TERM GOAL #4   Title Pt will improve FOTO score by 10 points or more demonstrating improved perceived functional ability    Baseline FOTO 7 on 10/17; 03/15/21: FOTO 12; 05/10/2021 06/21/2021= 1; 09/15/2021= 9; 12/13/2021= Will issue next visit 12/6: 4; 06/06/2022= Will assess next visit   Time 12    Period Weeks    Status ONGOING   Target date 08/29/2022     PT LONG TERM GOAL #5   Title Patient will perform sit to stand transfer with appropriate AD and max assist of 2 people with 75% consistency to prepare for pregait activities.    Baseline 09/15/2021= Patient unable to stand well- unable to clear his bottom off chair with Max assist of 2 persons. 12/13/2021- Goal not appropriate to try yet but will keep and roll over to next  cert as shift continues to focus on transfers/standing; 4/24= Patient able to perform active ankle DF/PF with right LE and able to raise his  knee into seated march and clear floor without physical assist today - as previously unable as well as lift right knee ext to near full ROM to improve strength for eventual standing.    Time 12    Period Weeks    Status Goal still not appropriate for now but will keep active for future   Target date    PT LONG TERM GOAL #6  Title Patient will tolerate sitting unsupported demonstrating erect sitting posture for 30 minutes with CGA to demonstrate improved back extensor strength and improved sitting tolerance.   Baseline 12/13/2021= Patient demonstrated unsupported sitting at edge of mat for approx 20 min;  12/29/2021- Patient performed approx 30 min of dynamic sitting activities today. 06/06/2022= Patient demonstrated ability to sit and perform static and dynamic UE/LE movement with only Supervision.   Time 12   Period Weeks   Status GOAL MET         7.  Patient will tolerate 5 minutes or more of standing in sit to stand lift in order to indicate improved lower extremity weightbearing tolerance for progression to standing in parallel bars. Baseline: 1 minute on most recent stand 02/21/22; 06/06/2022- Patient did attempt today after complaining of right LE pain last week. He attempted 3 stands using sit to stand lift. - all over 1 min- last one approx 48 sec- stopped due to fatigue. More erect standing in lift today - still poor gluteal strength but able to activate glutes and extend hips upon command but unable to hold > 5 sec. 07/28/2022- Will attempt standing next visit but patient has been able to stand since last progress note for up to 3 min at a time at his best.  Goal status: ONGOING- Progressing Target date: 08/29/2022  8. Patient will tolerate sitting unsupported demonstrating ability to perform dynamic UE/LE activities for 30 minutes  independently to demonstrate ability to sit at edge of bed to eat or perform some ADL's/exercise for optimal quality of life.  Baseline: 06/06/2022- Patient  able to sit and perform some UE/LE exercises but requires CGA at times for safety- he is able to static sit for 30 min with supervision. 07/27/2022= Patient able to now sit > 30 min with Supervision only - performing static and dynamic activities. Will keep goal active to ensure patient is consistent.   Goal Status: PROGRESSING  Target date: 08/29/2022       Plan     Clinical Impression Statement Treatment consisted of manual stretching/ROM exercises and strengthening. He remains weak from being sick but able to participate well exhibiting increased overall tightness yet still able to move right LE more actively.  Pt will continue to benefit from skilled physical therapy intervention to address impairments, improve QOL, and attain therapy goals.    Personal Factors and Comorbidities Comorbidity 3+;Time since onset of injury/illness/exacerbation    Comorbidities CVA, diabetes, Seizures    Examination-Activity Limitations Bathing;Bed Mobility;Bend;Caring for Others;Carry;Dressing;Hygiene/Grooming;Lift;Locomotion Level;Reach Overhead;Self Feeding;Sit;Squat;Stairs;Stand;Transfers;Toileting    Examination-Participation Restrictions Cleaning;Community Activity;Driving;Laundry;Medication Management;Meal Prep;Occupation;Personal Finances;Shop;Yard Work;Volunteer    Stability/Clinical Decision Making Evolving/Moderate complexity    Rehab Potential Fair    PT Frequency 2x / week    PT Duration 12 weeks    PT Treatment/Interventions ADLs/Self Care Home Management;Cryotherapy;Electrical Stimulation;Moist Heat;Ultrasound;DME Instruction;Gait training;Stair training;Functional mobility training;Therapeutic exercise;Balance training;Patient/family education;Orthotic Fit/Training;Neuromuscular re-education;Wheelchair mobility training;Manual techniques;Passive range of motion;Dry needling;Energy conservation;Taping;Visual/perceptual  remediation/compensation;Joint Manipulations    PT Next Visit Plan core strength-  sitting at EOM, postural control, sit to stand if able; Continue with progressive LE Strengthening. Stand activities as appropriate    PT Home Exercise Plan No changes to HEP today    Consulted and Agree with Plan of Care Patient;Family member/caregiver    Family Member Consulted Mom             Louis Meckel, PT Physical Therapist- [facility]  [facility] [place]  10/11/2022, 10:11 AM

## 2022-10-11 NOTE — Therapy (Signed)
OCCUPATIONAL THERAPY NEURO TREATMENT NOTE  Patient Name: Paul Hall MRN: [id number] DOB:12-15-1995, 27 y.o., male Today's Date: 10/11/2022  PCP: Dr. Sherwood Gambler REFERRING PROVIDER: Dr. Sherwood Gambler   OT End of Session - 10/11/22 0803     Visit Number 41    Number of Visits 60    Date for OT Re-Evaluation 12/19/22    Authorization Type 09/26/2022    OT Start Time 1615    OT Stop Time 1700    OT Time Calculation (min) 45 min    Activity Tolerance Patient tolerated treatment well    Behavior During Therapy WFL for tasks assessed/performed                     Past Medical History:  Diagnosis Date   Diabetes mellitus (HCC)    Hypertension    Stroke [facility])    Past Surgical History:  Procedure Laterality Date   IVC FILTER PLACEMENT ([facility] HX)     LEG SURGERY     PEG TUBE PLACEMENT     TRACHEOSTOMY     Patient Active Problem List   Diagnosis Date Noted   Sepsis due to vancomycin resistant Enterococcus species (HCC) 06/06/2019   SIRS (systemic inflammatory response syndrome) (HCC) 06/05/2019   Acute lower UTI 06/05/2019   VRE (vancomycin-resistant Enterococci) infection 06/05/2019   Anemia 06/05/2019   Skin ulcer of sacrum with necrosis of muscle (HCC)    Urinary retention    Type 2 diabetes mellitus without complication, with long-term current use of insulin (HCC)    Tachycardia    Lower extremity edema    Acute metabolic encephalopathy    Obstructive sleep apnea    Morbid obesity with BMI of 60.0-69.9, adult (HCC)    Goals of care, counseling/discussion    Palliative care encounter    Sepsis (HCC) 04/27/2019   H/O insulin dependent diabetes mellitus 04/27/2019   History of CVA with residual deficit 04/27/2019   Seizure disorder (HCC) 04/27/2019   Decubitus ulcer of sacral region, stage 4 (HCC) 04/27/2019    ONSET DATE: 01/2019  REFERRING DIAG: CVA/COVID-19  THERAPY DIAG:  Muscle weakness (generalized)  Rationale for Evaluation and Treatment  Rehabilitation  SUBJECTIVE:   SUBJECTIVE STATEMENT:  Pt./Caregiver Report both being sick with bad summer colds last week.   Pt accompanied by: self and family member  PERTINENT HISTORY:  Pt. is a 27 y.o. male who was diagnosed with COVID-19, and CVA with resultant quadriplegia. Pt. Was then hospitalized with VRE UTI. PMHx includes: urinary retention, seizure disorder, obstructive sleep disorder, DM Type II, Morbid obesity.   PRECAUTIONS: None  WEIGHT BEARING RESTRICTIONS No  PAIN:  Are you having pain? No. Positive for tenderness at the digits.   FALLS: Has patient fallen in last 6 months? No  LIVING ENVIRONMENT: Lives with: lives with their family Lives in: House/apartment Stairs: No Level Entry Has following equipment at home: Wheelchair (power) and hoyer lift, sit to stand lift  PLOF: Independent  PATIENT GOALS: To be able to engage in more daily care tasks.  OBJECTIVE:   HAND DOMINANCE: Right  ADLs:  Transfers/ambulation related to ADLs: Eating: Pt. reports being able to hold standard utensils, and is starting to engage more in self-feeding tasks, hand to mouth patterns. Pt. Reports that he does as much as he can with the task, and family assists  with the remainder of the task. Grooming: Pt. Is able to initiate holding an electric toothbrush, and brush his teeth. Family assists LB Dressing:  Total Assist UE dressing: Pt. is now able to reach up to actively assist with grasping , and pulling his gown down. Toileting:  Total Assist Bathing: MaxA UB, Total assist LB Tub Shower transfers: N/A Equipment: See above    IADLs: Shopping: Relies on family to assist Light housekeeping: Total Assist Meal Prep: Total Assist Community mobility:   Medication management:  Total Assist  Financial management: N/A Handwriting: Not legible: Pt. Is able to hold a pen with the left hand, and initiate marking the page. Pt.'s eye glasses were not available  MOBILITY STATUS:   Power w/c  POSTURE COMMENTS:  Pt. Requires position changes in his power w/c  ACTIVITY TOLERANCE: Activity tolerance:  Fair  FUNCTIONAL OUTCOME MEASURES: FOTO: 40  TR score: 45  05/25/2022:   FOTO: 50 TR score: 45  07/20/22: FOTO 55  UPPER EXTREMITY ROM     Active ROM Right eval Right 03/21/2022 Right 05/25/2022 R  07/20/22 Right  07/25/22 Right 08/15/2022 Right 09/26/2022 Left eval Left  03/21/2022 Left 05/25/2022 L  07/20/22 Left 07/25/2022 Left  08/15/2022 Left  09/26/2022  Shoulder flexion 106 scaption 105  Scaption 62 flexion 110 scaption 100  105 105 119 123 125 130  136 138  Shoulder abduction 114 90 97 100  105 117 110 115 115 115  118 121  Shoulder adduction                Shoulder extension                Shoulder internal rotation                Shoulder external rotation                Elbow flexion 120(130) 145 145 140  144 140 135(135) 145 145   145   Elbow extension -45(-35) -22(-35) -28(-22)  -32(-21) -32(-28) -28(-26) -27(-18) -21(-20) -20(-16)  -25(-10) -23(-10) -21(-10)  Wrist flexion                Wrist extension -30(10) -25(10) -10(30)  -10(20) -10(25) -20(40) 10(50) 19(50) 28(50)  30(50) 35(55) 36(55)  Wrist ulnar deviation                Wrist radial deviation                Wrist pronation                Wrist supination   Limited by flexor tone       Limited by flexor tone      (Blank rows = not tested)      UPPER EXTREMITY MMT:     Right eval Right 03/21/2022 Right  05/25/2022 R 07/20/22 Right 08/25/2022 Right 09/26/2022 Left eval Left 03/21/2022 Left  05/25/2022 L 07/20/22 Left  08/15/2022 Left 09/26/2022  Shoulder flexion 3-/5 scaption 3-/5 3-/5 3+/5 3+/5 3+/5 3-/5 3/5 3+/5 4+/5 4+/5 4+/5  Shoulder abduction 3-/5 3-/5 3-/5 4/5 4/5 4/5 3-/5 3-/5 4-/5 4+/5 4+/5 4+5  Shoulder adduction              Shoulder extension              Shoulder internal rotation              Shoulder external rotation              Elbow flexion 3/5 3/5 3+/5 4/5  4+/5 4+/5 3/5 3+/5 4-/5 4+/5 5/5 5/5  Elbow extension 2-/5  2/5 3-/5 4+/5 4+/5 4+/5 2-/5 2/5  4+/5 4+/5 4+/5  Wrist flexion              Wrist extension 2-/5 2-/5 2/5 2/5 2/5 2/5 2/5 2+/5 3-/5  3-/5 3-/5  Wrist ulnar deviation              Wrist radial deviation              Wrist pronation              Wrist supination              (Blank rows = not tested)     HAND FUNCTION:  Grip strength: Right: 0 lbs; Left: 0 lbs and Lateral pinch: Right: 5 lbs, Left: 2 lbs  03/21/2022: Lateral pinch: Right: 3.5 lbs, Left: 2 lbs  07/20/22: Grip strength: Right: 3 lbs; Left: 4 lbs and Lateral pinch: Right: 5 lbs, Left: 3 lbs  08/15/2022:  Grip strength: Right: 0 lbs; Left: 5 lbs and Lateral pinch: Right: 2 lbs, Left: 4 lbs  09/26/2022  Grip strength: Right: 0 lbs; Left: 8 lbs and Lateral pinch: Right: 2 lbs, Left: 5 lbs   Bilateral digit PIP/DIP flexion contractures with MP hyperextension with attempts for AROM. Pt. is able to tolerate AROM to the bilateral digits at the initial evaluation however, has a history of pain in the digits.  COORDINATION:  Eval: Pt. is unable to grasp 9-hole test pegs. Pt. is able to initiate grasping larger pegs, and is able to hold a pen in the left hand.  07/20/22: 2 min 36 seconds to remove 9 pegs from 9 hole peg test - cues to locate pegs 2/2 low vision. Pt. is able to initiate grasping larger pegs on R hand and is able to hold a pen in the left hand.  08/15/2022:      Vision, and sensation limiting accuracy of 9 hole peg test results. Pt. Was able to grasp and remove vertical pegs intermittently with cues.    SENSATION: Light touch: Impaired   EDEMA:  N/A  MUSCLE TONE: BUE flexor Spasticity  COGNITION Overall cognitive status: Continue to assess in functional context  VISION:   Subjective report: Pt. was not wearing glasses at the time of the initial eval.  Baseline vision: Vision is very limited. Wears glasses all the time Visual history:  History of impaired vision following CVA. Pt. Has received treatment through the [facility] low vision rehabilitation program.   VISION ASSESSMENT: Impaired To be further assessed in functional context  PERCEPTION: Impaired   PRAXIS: Impaired: motor planning  OBSERVATIONS:  Pt reports being on Tramadol   TODAY'S TREATMENT       Therapeutic EX.:   Pt. Worked on AAROM with PROM stretching to the end range for bilateral shoulder flexion, abduction, elbow flexion, extension, prolonged stretching for bilateral forearm supination, wrist extension, digit MP, PIP, and DIP extension, thumb radial abduction.     PATIENT EDUCATION: Education details: There. [place] Person educated: Patient and Parent Education method: Explanation, Demonstration, Tactile cues, and Verbal cues Education comprehension: verbalized understanding, returned demonstration, verbal cues required, and needs further education   HOME EXERCISE PROGRAM:   Continue ongoing assessment, and continue to provide as needed.     GOALS: Goals reviewed with patient? Yes  SHORT TERM GOALS: Target date: 11/07/2022         To assess splint fit, and make appropriate adjustments to promote good skin integrity through the palmar surface of  the bilateral hands.  Baseline: 05/25/22: Goal currently met, however ongoing as needs to assess splint fit arise. 03/23/2022: Pt. is wearing splints a couple of hours at night bilateral resting hand splints. 03/21/2022: Pt. is wearing splints a couple of hours at night bilateral resting hand splints. Goal status: Continue ongoing assessment as requested, and as needed   LONG TERM GOALS: Target date: 12/19/2022      FOTO score will Improve by 2 points for Pt. perceived improvement with the assessment specific ADL/IADL tasks.  Baseline: 09/27/2022: 51 07/20/2022: FOTO 55 05/25/2022: FOTO score: 50,  TR score: 45 Eval: FOTO score: 40,  TR score: 45 Goal status: Achieved   2.   Pt. will  independently perform oral care for 100% of the task after complete set-up. Baseline: 09/26/2022: Completes 90% of the task, proximal assist at times required at the elbow.  08/15/2022: Completes 90% of the task, proximal assist at times required at the elbow. 07/20/22: completes 90% of task, limited by shoulder flexion. 05/25/2022:  Pt. Is able to initiate and perform oral care for approximately 90% of the task. Complete set-up required. Assi needed only for the very back teeth. 03/23/2022: Pt. Is able to initiate and perform oral care for approximately 75% of the task. Pt. Requires assist at proximally at the elbow for through oral care. 03/21/2022: Pt. Is able to initiate and perform oral care for approximately 75% of the task. Pt. Requires assist at proximally at the elbow for through oral care. Eval: Pt. is able to initiate using an electric toothbrush. Pt. requires assist for set-up, and assist for thoroughness, and as he Pt. fatigues. Goal status: Ongoing  3.  Pt. Will be independent with self-feeding for 100% of the meal after complete set-up Baseline:09/26/2022: 90% using the spoon, assit at time with the forearm motion, and grip on the utensils. 100% for finger foods.  08/15/2022:  90% using the spoon, assit at time with the forearm motion, and grip on the utensils. 100% for finger foods-independent with set-up- unsupervised.  07/20/22: self-feeds cereal using spoon 90% of task. 05/25/2022: Pt. Is able to use a spoon to scoop cereal when feeding himself cereal 85% of the time. Pt. Is able to feed himself snack/finger foods 100% of the time. Pt. Continues to work on consistency of  stabilizing a cup/mug when drinking. Pt. Is able to grasp a water bottle with assist initially, with assist tapering off as he drinks.03/23/2022: Pt. Is able to perform scooping cereal for 75% of the time. Pt. required assist, and support at the left elbow, and Pt. Presents with limited forearm supination when using the spoon, and  bringing it towards his mouth. Pt. Is able to use a fork to spear items, and perform the hand to mouth pattern.  03/21/2022: Pt. Is able to perform scooping cereal for 75% of the time. Pt. required assist, and support at the left elbow, and Pt. presents with limited forearm supination when using the spoon, and bringing it towards his mouth. Pt. Is able to use a fork to spear items, and perform the hand to mouth pattern.  Eval: Pt. is able to hold standard standard utensils. Pt. Performs as much of the task as he, can and has assistance for the remainder. Goal status: Ongoing  4.  Pt. Will improve grasp patterns and consistently grasp 1/4" objects for ADL, and IADL tasks.  Baseline: 09/26/2022: Continues to grasp 1/4" pegs with min a + visual cues, consistently grasping 1/2" objects with visual cues.  08/15/2022: grasps 1/4" pegs with min a + visual cues, consistently grasping 1/2" objects with visual cues. 07/20/22: grasps 1/4" pegs with min a + visual cues, consistently grasping 1/2" objects with visual cues. 05/25/2022: Pt. Is working on improving consistency of grasping 1/2" objects with visual cues.  03/23/2022: Pt. Is able to grasp 1" objects consistently,and continues to work on the hand patterns needed to grasp 1/2" objects.03/21/2022: Pt. Is able to grasp 1" objects consistently,and continues to work on the hand patterns needed to grasp 1/2" objects. Eval: Pt. is able to grasp 1" objects intermittently using a lateral grasp pattern. Goal status: Progressing; goal revised to 1/4" items  5.  Pt. will independently write his name legibly with letter sizes under 1". Baseline: 09/26/2022: Pt. Continues to be able to write name with smaller letter size. Pt. Is signing name with a computer styus. Pt. Requires visual cues, and assist. 08/15/2022: Pt. Is able to write name with smaller letter size. Pt. Is signing name with a computer styus. Pt. Requires visual cues, and assist. 07/20/22: stabilizing assist to write  name with 75% legibility with 2" letters. 05/25/2022: Pt. to continue to work towards formulating grasp patterns in preparation for grasping a large width pen. Pt. Requires visual cues. 03/23/2022: Pt. Is able to write his name with modA, however has difficulty with formulating letter sizes less than 2" in size with 50% legibility for the 3 letters of his name.03/21/2022: Pt. Is able to write his name with modA, however has difficulty with formulating letter sizes less than 2" in size with 50% legibility for the 3 letters of his name. Eval: Pt is able to hold a thin marker with his left hand, and formulate a line, and initiate a circular pattern (Pt. without glasses today) Goal status: Progressing; Goal revised for more control with smaller writing size  6. Pt. Will reach up to comb/brush his hair  with minA.  Baseline: 09/26/2022: Pt. Is able to use a hair pick for the left side of his head using his left hand. Pt. Presents with difficulty reaching to the right side of his head. 5/13/202: 75% using a hair pick, assist to the right side of the head. 07/20/22: reaches 75% of head, assist for far R side of head, fatigues quickly. 05/25/2022: Pt. Is able to reach up with the left hand to the left side, top, and back of his head. 03/23/2022: Pt. is now able to more consistently initiate reaching up to his head with his left hand in preparation for haircare12/18/2023: Pt. is now able to more consistently initiate reaching up to his head with his left hand in preparation for haircare. Eval: Pt. is able to initiate reaching up for hair care with a long handled brush, however is unable to sustain UE's in elevation to perform the task.     Goal status: Ongoing  7. Pt. Will independently navigate the w/c through his environment with minA with visual scanning, and hand placement on the controls.  Baseline: 09/26/2022: Pt. Is now navigating his w/c ouside the home, and around his block. 08/15/2022: Pt. Requires minA to setup  hand on controls and MIN cues to navigate the w/c in wide spaces, requires MIN - MOD cues to navigate the w/c through more narrow doorways, and tighter turns - varies based on good vs bad vision days.07/20/22: Pt. Requires minA to setup hand on controls and MIN cues to navigate the w/c in wide spaces, requires MIN - MOD cues to navigate the w/c  through more narrow doorways, and tighter turns - varies based on good vs bad vision days. 05/25/2022: Pt. Requires minA  and  cues to navigate the w/c in wide spaces, and requires Mod cues to navigate the w/c through more narrow doorways, and tighter turns. Pt. Requires max cues for scanning through the environment, and moderate cues for hand placement on the controls.     Goal status: Ongoing    8. Pt. Will improve bilateral grip strength to be able to independently grasp, and pull up blankets, and linens.      Baseline: 09/26/2022: R: 0  L: 8# Pt. is attempting to grasp, and pull blankets up more, using mostly the left hand. 08/15/2022: Pt. has difficulty securely holding, and pulling up blankets, and linens.    Goal status: Ongoing   9. Pt. will consistently actively control the releasing  of blankets, covers, and linens from his hands once they are in the desired position over him. Baseline: 09/26/2022: Pt. Is improving with actively releasing blankets form his hands. 08/15/2022: Pt. has difficulty with controlled releasing of blankets/linens from his grasp.     Goal status: Ongoing    10. Pt. Will improve bilateral UE strength by 2 MM grades to assist with ADLs, and IADLs     Baseline: Right: shoulder flexion: 3+/5, abduction: 4/5, elbow flexion: 4+/5 , extension: 4+/5 wrist extension: 2/5; Left: shoulder flexion: 4+/5, abduction: 4+/5, elbow flexion: 5/5 , extension: 4+/5 wrist extension: 3-/5   ASSESSMENT:  CLINICAL IMPRESSION:  Pt. presents with increased bilateral UE elbow, forearm, wrist, and digit tightness. With increased tenderness in the right  digits. Pt. reports that they have note been able to do much ROM, or stretching at home this past week due to illness. Pt. is improving with using his bilateral hands to doff his gown independently with increased time. Pt. Is improving with, and continues to work on pulling blankets/sheets/linens up over him when in the recliner chair or bed. Pt. continues to work on improving BUE functioning, ROM, strength, motor control, [place] skills, and visual compensatory strategies to be able to independently pull up, and release blankets/covers, improve writing legibility, improve self-care including oral care, and utensil use during self-feeding tasks, and maximize independence with ADLs, and IADLs.     PERFORMANCE DEFICITS in functional skills including ADLs, IADLs, coordination, dexterity, proprioception, ROM, strength, pain, FMC, GMC, decreased knowledge of use of DME, and UE functional use, cognitive skills including safety awareness, and psychosocial skills including coping strategies, environmental adaptation, habits, and routines and behaviors.   IMPAIRMENTS are limiting patient from ADLs, IADLs, leisure, and social participation.   COMORBIDITIES may have co-morbidities  that affects occupational performance. Patient will benefit from skilled OT to address above impairments and improve overall function.  MODIFICATION OR ASSISTANCE TO COMPLETE EVALUATION: Maximum or significant modification of tasks or assist is necessary to complete an evaluation.  OT OCCUPATIONAL PROFILE AND HISTORY: Comprehensive assessment: Review of records and extensive additional review of physical, cognitive, psychosocial history related to current functional performance.  CLINICAL DECISION MAKING: High - multiple treatment options, significant modification of task necessary  REHAB POTENTIAL: Fair    EVALUATION COMPLEXITY: High    PLAN: OT FREQUENCY: 2 x's a week  OT DURATION:12 weeks  PLANNED INTERVENTIONS: self  care/ADL training, therapeutic exercise, therapeutic activity, neuromuscular re-education, manual therapy, passive range of motion, patient/family education, and cognitive remediation/compensation  RECOMMENDED OTHER SERVICES: PT  CONSULTED AND AGREED WITH PLAN OF CARE: Patient and family member/caregiver  PLAN  FOR NEXT SESSION: Initiate treatment  Olegario Messier, MS, OTR/L  10/11/2022

## 2022-10-12 ENCOUNTER — Ambulatory Visit: Payer: BC Managed Care – PPO | Admitting: Occupational Therapy

## 2022-10-12 ENCOUNTER — Ambulatory Visit: Payer: BC Managed Care – PPO

## 2022-10-12 DIAGNOSIS — I6930 Unspecified sequelae of cerebral infarction: Secondary | ICD-10-CM

## 2022-10-12 DIAGNOSIS — M6281 Muscle weakness (generalized): Secondary | ICD-10-CM | POA: Diagnosis not present

## 2022-10-12 DIAGNOSIS — R262 Difficulty in walking, not elsewhere classified: Secondary | ICD-10-CM | POA: Diagnosis not present

## 2022-10-12 DIAGNOSIS — R269 Unspecified abnormalities of gait and mobility: Secondary | ICD-10-CM | POA: Diagnosis not present

## 2022-10-12 DIAGNOSIS — R278 Other lack of coordination: Secondary | ICD-10-CM

## 2022-10-12 DIAGNOSIS — R2681 Unsteadiness on feet: Secondary | ICD-10-CM | POA: Diagnosis not present

## 2022-10-13 NOTE — Therapy (Signed)
OUTPATIENT PHYSICAL THERAPY TREATMENT NOTE/Physical Therapy Progress Note   Dates of reporting period  07/27/2022   to   10/12/2022      Patient Name: Paul Hall MRN: [id number] DOB:06/09/1995, 27 y.o., male Today's Date: 10/13/2022  PCP:  Cindi Carbon PROVIDER:  Lenise Herald, PA-C   PT End of Session - 10/12/22 1029     Visit Number 110    Number of Visits 128    Date for PT Re-Evaluation 11/30/22    Authorization Type [employer]    Progress Note Due on Visit 110    PT Start Time 1532    PT Stop Time 1614    PT Time Calculation (min) 42 min    Activity Tolerance Patient tolerated treatment well    Behavior During Therapy WFL for tasks assessed/performed                            Past Medical History:  Diagnosis Date   Diabetes mellitus (HCC)    Hypertension    Stroke (HCC)    Past Surgical History:  Procedure Laterality Date   IVC FILTER PLACEMENT ([facility] HX)     LEG SURGERY     PEG TUBE PLACEMENT     TRACHEOSTOMY     Patient Active Problem List   Diagnosis Date Noted   Sepsis due to vancomycin resistant Enterococcus species (HCC) 06/06/2019   SIRS (systemic inflammatory response syndrome) (HCC) 06/05/2019   Acute lower UTI 06/05/2019   VRE (vancomycin-resistant Enterococci) infection 06/05/2019   Anemia 06/05/2019   Skin ulcer of sacrum with necrosis of muscle (HCC)    Urinary retention    Type 2 diabetes mellitus without complication, with long-term current use of insulin (HCC)    Tachycardia    Lower extremity edema    Acute metabolic encephalopathy    Obstructive sleep apnea    Morbid obesity with BMI of 60.0-69.9, adult (HCC)    Goals of care, counseling/discussion    Palliative care encounter    Sepsis (HCC) 04/27/2019   H/O insulin dependent diabetes mellitus 04/27/2019   History of CVA with residual deficit 04/27/2019   Seizure disorder (HCC) 04/27/2019   Decubitus ulcer of sacral region, stage 4 (HCC) 04/27/2019     REFERRING DIAG: Cerebral infarction, unspecified   THERAPY DIAG:  Muscle weakness (generalized)  Other lack of coordination  History of CVA with residual deficit  Difficulty in walking, not elsewhere classified  Rationale for Evaluation and Treatment Rehabilitation  PERTINENT HISTORY: Paul Hall is a 25yoM who presents with severe weakness, quadriparesis, altered sensorium, and visual impairment s/p critical illness and prolonged hospitalization. Pt hospitalized in October 2020 with ARDS 2/2 COVID19 infection. Pt sustained a complex and lengthy hospitalization which included tracheostomy, prolonged sedation, ECMO. In this period pt sustained CVA and SDH. Pt has now been liberated from tracheostomy and G-tube. Pt has since been hospitalized for wound infection and UTI. Pt lives with parents at home, has hospital bed and left chair, hoyer lift transfers, and power WC for mobility needs. Pt needs heavy physical assistance with ADL 2/2 BUE contractures and motor dysfunction   PRECAUTIONS: Fall  SUBJECTIVE: Patient reports still recovering from being sick past couple of weeks. States not up to par but agreeable to performing some exercises on mat.      PAIN:   Are you having pain? No   TODAY'S TREATMENT:  - 10/12/2022  THERAPEUTIC ACTIVITIES:   *Dependent hoyer transfer with assist  of PT/Patient mother from power w/c to middle of large mat table   Ant/post trunk leans- VC and tactile cues to increase anterior lean - Paitent able to improve with practice- some fear with leaning forward x 20 reps each directio to simulate "nose over toes approach for future transfers." X 20 reps  Cross body- reaching with BUE- (some limitations with RUE due to tone) - 20 reps each Direction.     Lateral trunk leans- placing hands on mat attempting some UE weightbearing x 20 reps each.   *at end of session- *Dependent hoyer transfer with assist of PT/Patient mother from mat table back to power  w/c   THERAPEUTIC EXERCISE:  (while sitting edge of  mat)  Knee ext AROM x 20 reps each LE - able to clear foot Bilaterally- Reported feeling uneady but unable to clairfy or explain - initially but determined he felt he was too close to [place] of mat which triggered some anxiety.    Heel raises- active- Clearing each heel from floor x 20 reps each LE.   Hip march- AAROM on right initially - progressed to AROM (able to clear foot several times)  and AROM on left x 20 reps  Patient dependently hoyered from mat table back to power w/c.          Pt educated throughout session about proper posture and technique with exercises. Improved exercise technique, movement at target joints, use of target muscles after min to mod verbal, visual, tactile cues.       PATIENT EDUCATION: Education details: standing posture and exercise technique Person educated: Patient Education method: Explanation, Demonstration, Tactile cues, and Verbal cues Education comprehension: verbalized understanding, returned demonstration, verbal cues required, tactile cues required, and needs further education   HOME EXERCISE PROGRAM:  Access Code: [email or web address] URL: [email or web address] Date: 03/23/2022 Prepared by: Maureen Ralphs  Exercises - Supine Bridge  - 3 x weekly - 3 sets - 10 reps - 2 hold - Supine Gluteal Sets  - 3 x weekly - 3 sets - 10 reps - 5 sec hold - Supine Quad Set  - 1 x daily - 3 x weekly - 3 sets - 10 reps - 5 hold - Seated Long Arc Quad  - 1 x daily - 7 x weekly - 3 sets - 10 reps - Seated Hip Adduction Squeeze with Ball  - 1 x daily - 3 x weekly - 3 sets - 10 reps - 5 hold - Seated Hip Abduction  - 1 x daily - 3 x weekly - 3 sets - 10 reps - 2 hold    PT Short Term Goals -       PT SHORT TERM GOAL #1   Title Pt will be independent with HEP in order to improve strength and balance in order to decrease fall risk and improve function at home and work.    Baseline  01/04/2021= No formal HEP in place; 12/12 no HEP in place; 05/10/2021-Patient and his father were able to report compliance with curent HEP consisting of mostly seated/reclined LE strengthening. Both verbalize no questions at this time.    Time 6    Period Weeks    Status Achieved    Target Date 02/15/21            PT LONG TERM GOAL #1     Title Patient will increase BLE gross strength by 1/2 muscle grade to improve functional strength for improved independence with potential gait, increased standing tolerance and  increased ADL ability.     Baseline 01/04/2021- Patient presents with 1/5 to 3-/5 B LE strength with MMT; 12/12: goal partially met for Left knee/hip; 05/10/2021= 2-/5 bilateal Hip flex; 3+/5 bilateral Knee ext; 06/21/2021= Patient presents with 2-/5 bilateral Hip flex; 3+/5 bilateral knee ext/flex; 2-/5 left ankle DF; 0/5 right ankle- and able to increase reps and resistance with LE's. 09/15/2021- Patient technically presents with 2-/5 B hip flex/abd/add - but he is able to raise his hip up to approx 100 deg which has improved. 3+/5 Bilateral knee ext, 2-/5 left ankle and 0/5 right ankle.  12/08/2021= Patient able to lift left knee at 110 deg of hip flex; presents with 3+/5 knee ext, 2-/5 left ankle DF and 0/5 right ankle DF, 2-/5 bilateral Hip abd in seated position.    12/6: R: knee 3+/5 ext, 2/5 flexion, left knee 3+/5 extension, 3+/5 flexion, R hip: 2+/5 hip add, 2+/5 hip ABD L hip: 4-/5 hip ABD, 3+/5 hip ADD, 3+/5 hip flexion; 06/06/2022= Patient now presents with 2-/5 right ankle DF/PF;     Time 12     Period Weeks     Status MET    Target Date 03/07/2022         PT LONG TERM GOAL #2    Title Patient will tolerate sitting unsupported demonstrating erect sitting posture for 15 minutes with CGA to demonstrate improved back extensor strength and improved sitting tolerance.     Baseline 01/04/2021- Patient confied to sitting in lift chair or electric power chair with back support and unable to  sit upright without physical assistance; 12/12: tolerates <1 minutes upright unsupported sitting. 05/10/2021=static sit with forward trunk lean  in his power wheelchair without back support x approx 3 min. 06/21/2021=Unable to assess today due to patient with acute back pain but on previous visit able to sit x 8 min without back support. 09/15/2021- on last visit- 09/13/2021- patient was able to sit unsupported x 8 min at edge of mat. 10/13/2023 - Patient was able to sit at edge of mat with varying level of assist today from SBA to min A for a total of 20 min. 12/13/2021= Patient demonstrated unsupported sitting at edge of mat for approx 20 min    Time 12     Period Weeks     Status GOAL MET    Target Date 12/08/21          PT LONG TERM GOAL #3    Title Patient will demonstrate ability to perform static standing in // bars > 2 min with Max Assist  without loss of balance and fair posture for improved overall strength for pre-gait and transfer activities.     Baseline 01/04/2021= Patient current uanble to stand- Dependent on hoyer or sit to stand lift for transfers. 05/10/2021=Not appropriate yet- Currently still dependent with all transfers using hoyer. 06/21/2021= Patient continuing now to focus on LE strengthening to prepare for standing-unable to try today due to acute low back pain-  planning on attempting in new cert period. 09/15/2021- Patient has attempted standing 2x in past two week- max Assist of 2 people - only once was he successful to clearing his bottom from chair - Will continue to be a focus during the new certification. 12/13/2021= Patient has been limited secondary to increased overall low back pain during this certification and will require more time to focus on this goal.  12/6: not assessed this date, will assess at date when 2-3 PTs are present for assistance  Time 12     Period Weeks     Status GOAL not appropriate at this time - may attempt in future once Patient presents with improved  overall LE strength.                 PT LONG TERM GOAL #4    Title Pt will improve FOTO score by 10 points or more demonstrating improved perceived functional ability     Baseline FOTO 7 on 10/17; 03/15/21: FOTO 12; 05/10/2021 06/21/2021= 1; 09/15/2021= 9; 12/13/2021= Will issue next visit 12/6: 4; 06/06/2022= Will assess next visit    Time 12     Period Weeks     Status ONGOING    Target date 11/30/2022         PT LONG TERM GOAL #5    Title Patient will perform sit to stand transfer with appropriate AD and max assist of 2 people with 75% consistency to prepare for pregait activities.     Baseline 09/15/2021= Patient unable to stand well- unable to clear his bottom off chair with Max assist of 2 persons. 12/13/2021- Goal not appropriate to try yet but will keep and roll over to next cert as shift continues to focus on transfers/standing; 4/24= Patient able to perform active ankle DF/PF with right LE and able to raise his knee into seated march and clear floor without physical assist today - as previously unable as well as lift right knee ext to near full ROM to improve strength for eventual standing. 09/07/2022- Goal continues to not be appropriate but patient is now standing some and will keep goal active    Time 12     Period Weeks     Status Goal still not appropriate for now but will keep active for future    Target date         PT LONG TERM GOAL #6  Title Patient will tolerate sitting unsupported demonstrating erect sitting posture for 30 minutes with CGA to demonstrate improved back extensor strength and improved sitting tolerance.   Baseline 12/13/2021= Patient demonstrated unsupported sitting at edge of mat for approx 20 min;  12/29/2021- Patient performed approx 30 min of dynamic sitting activities today. 06/06/2022= Patient demonstrated ability to sit and perform static and dynamic UE/LE movement with only Supervision.   Time 12   Period Weeks   Status GOAL MET           7.  Patient will  tolerate 5 minutes or more of standing in sit to stand lift or with max assist +2 in order to indicate improved lower extremity weightbearing tolerance for progression to standing in parallel bars. Baseline: 1 minute on most recent stand 02/21/22; 06/06/2022- Patient did attempt today after complaining of right LE pain last week. He attempted 3 stands using sit to stand lift. - all over 1 min- last one approx 48 sec- stopped due to fatigue. More erect standing in lift today - still poor gluteal strength but able to activate glutes and extend hips upon command but unable to hold > 5 sec. 07/28/2022- Will attempt standing next visit but patient has been able to stand since last progress note for up to 3 min at a time at his best. 09/07/2022-  attempted standing with max +2 and patient able to stand 15-20 sec so will now  revise goal Goal status: GOAL Revised/ongoing Target date: 11/30/2022   8. Patient will tolerate sitting unsupported demonstrating ability to perform dynamic UE/LE activities for  30 minutes  independently to demonstrate ability to sit at edge of bed to eat or perform some ADL's/exercise for optimal quality of life.             Baseline: 06/06/2022- Patient able to sit and perform some UE/LE exercises but requires CGA at times for safety- he is able to static sit for 30 min with supervision. 07/27/2022= Patient able to now sit > 30 min with Supervision only - performing static and dynamic activities. Will keep goal active to ensure patient is consistent. 09/07/2022=  Patient able to demonstrate consistent ability to sit at edge of mat during visits             Goal Status: MET             Target date: 08/29/2022        Plan     Clinical Impression Statement Treatment still modified as patient is still recovering from being sick. He initially performed well over with theract including dynamic sitting weight shifting and later seated LE therex but appeared anxious and stated "something just  doesn't feel right" He could not explain but later determined he felt like he was maybe too close to edge causing some anxiety. Plan to attempt to resume standing as appropriate next week. He continues to improve in his mobility/strength in right LE - able to raise hip/extend knee/lift heel on his own. Did not reassess goal for standing for progress note today as patient was not physically up to standing today. Patient's condition has the potential to improve in response to therapy. Maximum improvement is yet to be obtained. The anticipated improvement is attainable and reasonable in a generally predictable time.   Pt will continue to benefit from skilled physical therapy intervention to address impairments, improve QOL, and attain therapy goals.    Personal Factors and Comorbidities Comorbidity 3+;Time since onset of injury/illness/exacerbation    Comorbidities CVA, diabetes, Seizures    Examination-Activity Limitations Bathing;Bed Mobility;Bend;Caring for Others;Carry;Dressing;Hygiene/Grooming;Lift;Locomotion Level;Reach Overhead;Self Feeding;Sit;Squat;Stairs;Stand;Transfers;Toileting    Examination-Participation Restrictions Cleaning;Community Activity;Driving;Laundry;Medication Management;Meal Prep;Occupation;Personal Finances;Shop;Yard Work;Volunteer    Stability/Clinical Decision Making Evolving/Moderate complexity    Rehab Potential Fair    PT Frequency 2x / week    PT Duration 12 weeks    PT Treatment/Interventions ADLs/Self Care Home Management;Cryotherapy;Electrical Stimulation;Moist Heat;Ultrasound;DME Instruction;Gait training;Stair training;Functional mobility training;Therapeutic exercise;Balance training;Patient/family education;Orthotic Fit/Training;Neuromuscular re-education;Wheelchair mobility training;Manual techniques;Passive range of motion;Dry needling;Energy conservation;Taping;Visual/perceptual remediation/compensation;Joint Manipulations    PT Next Visit Plan core strength- sitting  at EOM, postural control, sit to stand if able; Continue with progressive LE Strengthening. Stand activities as appropriate.     PT Home Exercise Plan No changes to HEP today    Consulted and Agree with Plan of Care Patient;Family member/caregiver    Family Member Consulted Mom             Louis Meckel, PT Physical Therapist- [facility]  [facility]  10/13/2022, 1:01 PM

## 2022-10-13 NOTE — Therapy (Signed)
OCCUPATIONAL THERAPY NEURO TREATMENT NOTE  Patient Name: Paul Hall MRN: [id number] DOB:1995/11/17, 27 y.o., male Today's Date: 10/13/2022  PCP: Dr. Sherwood Gambler REFERRING PROVIDER: Dr. Sherwood Gambler   OT End of Session - 10/13/22 0819     Visit Number [id number]    Number of Visits 60    Date for OT Re-Evaluation 12/19/22    OT Start Time 1613    OT Stop Time 1700    OT Time Calculation (min) 47 min    Activity Tolerance Patient tolerated treatment well    Behavior During Therapy WFL for tasks assessed/performed                     Past Medical History:  Diagnosis Date   Diabetes mellitus (HCC)    Hypertension    Stroke [facility])    Past Surgical History:  Procedure Laterality Date   IVC FILTER PLACEMENT ([facility] HX)     LEG SURGERY     PEG TUBE PLACEMENT     TRACHEOSTOMY     Patient Active Problem List   Diagnosis Date Noted   Sepsis due to vancomycin resistant Enterococcus species (HCC) 06/06/2019   SIRS (systemic inflammatory response syndrome) (HCC) 06/05/2019   Acute lower UTI 06/05/2019   VRE (vancomycin-resistant Enterococci) infection 06/05/2019   Anemia 06/05/2019   Skin ulcer of sacrum with necrosis of muscle (HCC)    Urinary retention    Type 2 diabetes mellitus without complication, with long-term current use of insulin (HCC)    Tachycardia    Lower extremity edema    Acute metabolic encephalopathy    Obstructive sleep apnea    Morbid obesity with BMI of 60.0-69.9, adult (HCC)    Goals of care, counseling/discussion    Palliative care encounter    Sepsis (HCC) 04/27/2019   H/O insulin dependent diabetes mellitus 04/27/2019   History of CVA with residual deficit 04/27/2019   Seizure disorder (HCC) 04/27/2019   Decubitus ulcer of sacral region, stage 4 (HCC) 04/27/2019    ONSET DATE: 01/2019  REFERRING DIAG: CVA/COVID-19  THERAPY DIAG:  Muscle weakness (generalized)  Rationale for Evaluation and Treatment Rehabilitation  SUBJECTIVE:   SUBJECTIVE  STATEMENT:  Pt./Caregiver Report  Pt. Is not feeling great today 2/2 changes in medication.  Pt accompanied by: self and family member  PERTINENT HISTORY:  Pt. is a 27 y.o. male who was diagnosed with COVID-19, and CVA with resultant quadriplegia. Pt. Was then hospitalized with VRE UTI. PMHx includes: urinary retention, seizure disorder, obstructive sleep disorder, DM Type II, Morbid obesity.   PRECAUTIONS: None  WEIGHT BEARING RESTRICTIONS No  PAIN:  Are you having pain? No. Positive for tenderness at the digits.   FALLS: Has patient fallen in last 6 months? No  LIVING ENVIRONMENT: Lives with: lives with their family Lives in: House/apartment Stairs: No Level Entry Has following equipment at home: Wheelchair (power) and hoyer lift, sit to stand lift  PLOF: Independent  PATIENT GOALS: To be able to engage in more daily care tasks.  OBJECTIVE:   HAND DOMINANCE: Right  ADLs:  Transfers/ambulation related to ADLs: Eating: Pt. reports being able to hold standard utensils, and is starting to engage more in self-feeding tasks, hand to mouth patterns. Pt. Reports that he does as much as he can with the task, and family assists  with the remainder of the task. Grooming: Pt. Is able to initiate holding an electric toothbrush, and brush his teeth. Family assists LB Dressing: Total Assist UE dressing: Pt.  is now able to reach up to actively assist with grasping , and pulling his gown down. Toileting:  Total Assist Bathing: MaxA UB, Total assist LB Tub Shower transfers: N/A Equipment: See above    IADLs: Shopping: Relies on family to assist Light housekeeping: Total Assist Meal Prep: Total Assist Community mobility:   Medication management:  Total Assist  Financial management: N/A Handwriting: Not legible: Pt. Is able to hold a pen with the left hand, and initiate marking the page. Pt.'s eye glasses were not available  MOBILITY STATUS:  Power w/c  POSTURE COMMENTS:  Pt.  Requires position changes in his power w/c  ACTIVITY TOLERANCE: Activity tolerance:  Fair  FUNCTIONAL OUTCOME MEASURES: FOTO: 40  TR score: 45  05/25/2022:   FOTO: 50 TR score: 45  07/20/22: FOTO 55  UPPER EXTREMITY ROM     Active ROM Right eval Right 03/21/2022 Right 05/25/2022 R  07/20/22 Right  07/25/22 Right 08/15/2022 Right 09/26/2022 Left eval Left  03/21/2022 Left 05/25/2022 L  07/20/22 Left 07/25/2022 Left  08/15/2022 Left  09/26/2022  Shoulder flexion 106 scaption 105  Scaption 62 flexion 110 scaption 100  105 105 119 123 125 130  136 138  Shoulder abduction 114 90 97 100  105 117 110 115 115 115  118 121  Shoulder adduction                Shoulder extension                Shoulder internal rotation                Shoulder external rotation                Elbow flexion 120(130) 145 145 140  144 140 135(135) 145 145   145   Elbow extension -45(-35) -22(-35) -28(-22)  -32(-21) -32(-28) -28(-26) -27(-18) -21(-20) -20(-16)  -25(-10) -23(-10) -21(-10)  Wrist flexion                Wrist extension -30(10) -25(10) -10(30)  -10(20) -10(25) -20(40) 10(50) 19(50) 28(50)  30(50) 35(55) 36(55)  Wrist ulnar deviation                Wrist radial deviation                Wrist pronation                Wrist supination   Limited by flexor tone       Limited by flexor tone      (Blank rows = not tested)      UPPER EXTREMITY MMT:     Right eval Right 03/21/2022 Right  05/25/2022 R 07/20/22 Right 08/25/2022 Right 09/26/2022 Left eval Left 03/21/2022 Left  05/25/2022 L 07/20/22 Left  08/15/2022 Left 09/26/2022  Shoulder flexion 3-/5 scaption 3-/5 3-/5 3+/5 3+/5 3+/5 3-/5 3/5 3+/5 4+/5 4+/5 4+/5  Shoulder abduction 3-/5 3-/5 3-/5 4/5 4/5 4/5 3-/5 3-/5 4-/5 4+/5 4+/5 4+5  Shoulder adduction              Shoulder extension              Shoulder internal rotation              Shoulder external rotation              Elbow flexion 3/5 3/5 3+/5 4/5 4+/5 4+/5 3/5 3+/5 4-/5 4+/5 5/5 5/5   Elbow extension 2-/5 2/5 3-/5 4+/5 4+/5 4+/5  2-/5 2/5  4+/5 4+/5 4+/5  Wrist flexion              Wrist extension 2-/5 2-/5 2/5 2/5 2/5 2/5 2/5 2+/5 3-/5  3-/5 3-/5  Wrist ulnar deviation              Wrist radial deviation              Wrist pronation              Wrist supination              (Blank rows = not tested)     HAND FUNCTION:  Grip strength: Right: 0 lbs; Left: 0 lbs and Lateral pinch: Right: 5 lbs, Left: 2 lbs  03/21/2022: Lateral pinch: Right: 3.5 lbs, Left: 2 lbs  07/20/22: Grip strength: Right: 3 lbs; Left: 4 lbs and Lateral pinch: Right: 5 lbs, Left: 3 lbs  08/15/2022:  Grip strength: Right: 0 lbs; Left: 5 lbs and Lateral pinch: Right: 2 lbs, Left: 4 lbs  09/26/2022  Grip strength: Right: 0 lbs; Left: 8 lbs and Lateral pinch: Right: 2 lbs, Left: 5 lbs   Bilateral digit PIP/DIP flexion contractures with MP hyperextension with attempts for AROM. Pt. is able to tolerate AROM to the bilateral digits at the initial evaluation however, has a history of pain in the digits.  COORDINATION:  Eval: Pt. is unable to grasp 9-hole test pegs. Pt. is able to initiate grasping larger pegs, and is able to hold a pen in the left hand.  07/20/22: 2 min 36 seconds to remove 9 pegs from 9 hole peg test - cues to locate pegs 2/2 low vision. Pt. is able to initiate grasping larger pegs on R hand and is able to hold a pen in the left hand.  08/15/2022:      Vision, and sensation limiting accuracy of 9 hole peg test results. Pt. Was able to grasp and remove vertical pegs intermittently with cues.    SENSATION: Light touch: Impaired   EDEMA:  N/A  MUSCLE TONE: BUE flexor Spasticity  COGNITION Overall cognitive status: Continue to assess in functional context  VISION:   Subjective report: Pt. was not wearing glasses at the time of the initial eval.  Baseline vision: Vision is very limited. Wears glasses all the time Visual history: History of impaired vision following  CVA. Pt. Has received treatment through the [facility] low vision rehabilitation program.   VISION ASSESSMENT: Impaired To be further assessed in functional context  PERCEPTION: Impaired   PRAXIS: Impaired: motor planning  OBSERVATIONS:  Pt reports being on Tramadol   TODAY'S TREATMENT     Therapeutic EX.:   Pt. Worked on AAROM with PROM stretching to the end range for bilateral shoulder flexion, abduction, elbow flexion, extension, prolonged stretching for bilateral forearm supination, wrist extension, digit MP, PIP, and DIP extension, thumb radial abduction.     PATIENT EDUCATION: Education details: There. Halford Decamp Person educated: Patient and Parent Education method: Explanation, Demonstration, Tactile cues, and Verbal cues Education comprehension: verbalized understanding, returned demonstration, verbal cues required, and needs further education   HOME EXERCISE PROGRAM:   Continue ongoing assessment, and continue to provide as needed.     GOALS: Goals reviewed with patient? Yes  SHORT TERM GOALS: Target date: 11/07/2022         To assess splint fit, and make appropriate adjustments to promote good skin integrity through the palmar surface of the bilateral hands.  Baseline: 05/25/22: Goal  currently met, however ongoing as needs to assess splint fit arise. 03/23/2022: Pt. is wearing splints a couple of hours at night bilateral resting hand splints. 03/21/2022: Pt. is wearing splints a couple of hours at night bilateral resting hand splints. Goal status: Continue ongoing assessment as requested, and as needed   LONG TERM GOALS: Target date: 12/19/2022      FOTO score will Improve by 2 points for Pt. perceived improvement with the assessment specific ADL/IADL tasks.  Baseline: 09/27/2022: 51 07/20/2022: FOTO 55 05/25/2022: FOTO score: 50,  TR score: 45 Eval: FOTO score: 40,  TR score: 45 Goal status: Achieved   2.   Pt. will independently perform oral care for 100% of  the task after complete set-up. Baseline: 09/26/2022: Completes 90% of the task, proximal assist at times required at the elbow.  08/15/2022: Completes 90% of the task, proximal assist at times required at the elbow. 07/20/22: completes 90% of task, limited by shoulder flexion. 05/25/2022:  Pt. Is able to initiate and perform oral care for approximately 90% of the task. Complete set-up required. Assi needed only for the very back teeth. 03/23/2022: Pt. Is able to initiate and perform oral care for approximately 75% of the task. Pt. Requires assist at proximally at the elbow for through oral care. 03/21/2022: Pt. Is able to initiate and perform oral care for approximately 75% of the task. Pt. Requires assist at proximally at the elbow for through oral care. Eval: Pt. is able to initiate using an electric toothbrush. Pt. requires assist for set-up, and assist for thoroughness, and as he Pt. fatigues. Goal status: Ongoing  3.  Pt. Will be independent with self-feeding for 100% of the meal after complete set-up Baseline:09/26/2022: 90% using the spoon, assit at time with the forearm motion, and grip on the utensils. 100% for finger foods.  08/15/2022:  90% using the spoon, assit at time with the forearm motion, and grip on the utensils. 100% for finger foods-independent with set-up- unsupervised.  07/20/22: self-feeds cereal using spoon 90% of task. 05/25/2022: Pt. Is able to use a spoon to scoop cereal when feeding himself cereal 85% of the time. Pt. Is able to feed himself snack/finger foods 100% of the time. Pt. Continues to work on consistency of  stabilizing a cup/mug when drinking. Pt. Is able to grasp a water bottle with assist initially, with assist tapering off as he drinks.03/23/2022: Pt. Is able to perform scooping cereal for 75% of the time. Pt. required assist, and support at the left elbow, and Pt. Presents with limited forearm supination when using the spoon, and bringing it towards his mouth. Pt. Is able to  use a fork to spear items, and perform the hand to mouth pattern.  03/21/2022: Pt. Is able to perform scooping cereal for 75% of the time. Pt. required assist, and support at the left elbow, and Pt. presents with limited forearm supination when using the spoon, and bringing it towards his mouth. Pt. Is able to use a fork to spear items, and perform the hand to mouth pattern.  Eval: Pt. is able to hold standard standard utensils. Pt. Performs as much of the task as he, can and has assistance for the remainder. Goal status: Ongoing  4.  Pt. Will improve grasp patterns and consistently grasp 1/4" objects for ADL, and IADL tasks.  Baseline: 09/26/2022: Continues to grasp 1/4" pegs with min a + visual cues, consistently grasping 1/2" objects with visual cues. 08/15/2022: grasps 1/4" pegs with min a +  visual cues, consistently grasping 1/2" objects with visual cues. 07/20/22: grasps 1/4" pegs with min a + visual cues, consistently grasping 1/2" objects with visual cues. 05/25/2022: Pt. Is working on improving consistency of grasping 1/2" objects with visual cues.  03/23/2022: Pt. Is able to grasp 1" objects consistently,and continues to work on the hand patterns needed to grasp 1/2" objects.03/21/2022: Pt. Is able to grasp 1" objects consistently,and continues to work on the hand patterns needed to grasp 1/2" objects. Eval: Pt. is able to grasp 1" objects intermittently using a lateral grasp pattern. Goal status: Progressing; goal revised to 1/4" items  5.  Pt. will independently write his name legibly with letter sizes under 1". Baseline: 09/26/2022: Pt. Continues to be able to write name with smaller letter size. Pt. Is signing name with a computer styus. Pt. Requires visual cues, and assist. 08/15/2022: Pt. Is able to write name with smaller letter size. Pt. Is signing name with a computer styus. Pt. Requires visual cues, and assist. 07/20/22: stabilizing assist to write name with 75% legibility with 2" letters.  05/25/2022: Pt. to continue to work towards formulating grasp patterns in preparation for grasping a large width pen. Pt. Requires visual cues. 03/23/2022: Pt. Is able to write his name with modA, however has difficulty with formulating letter sizes less than 2" in size with 50% legibility for the 3 letters of his name.03/21/2022: Pt. Is able to write his name with modA, however has difficulty with formulating letter sizes less than 2" in size with 50% legibility for the 3 letters of his name. Eval: Pt is able to hold a thin marker with his left hand, and formulate a line, and initiate a circular pattern (Pt. without glasses today) Goal status: Progressing; Goal revised for more control with smaller writing size  6. Pt. Will reach up to comb/brush his hair  with minA.  Baseline: 09/26/2022: Pt. Is able to use a hair pick for the left side of his head using his left hand. Pt. Presents with difficulty reaching to the right side of his head. 5/13/202: 75% using a hair pick, assist to the right side of the head. 07/20/22: reaches 75% of head, assist for far R side of head, fatigues quickly. 05/25/2022: Pt. Is able to reach up with the left hand to the left side, top, and back of his head. 03/23/2022: Pt. is now able to more consistently initiate reaching up to his head with his left hand in preparation for haircare12/18/2023: Pt. is now able to more consistently initiate reaching up to his head with his left hand in preparation for haircare. Eval: Pt. is able to initiate reaching up for hair care with a long handled brush, however is unable to sustain UE's in elevation to perform the task.     Goal status: Ongoing  7. Pt. Will independently navigate the w/c through his environment with minA with visual scanning, and hand placement on the controls.  Baseline: 09/26/2022: Pt. Is now navigating his w/c ouside the home, and around his block. 08/15/2022: Pt. Requires minA to setup hand on controls and MIN cues to navigate  the w/c in wide spaces, requires MIN - MOD cues to navigate the w/c through more narrow doorways, and tighter turns - varies based on good vs bad vision days.07/20/22: Pt. Requires minA to setup hand on controls and MIN cues to navigate the w/c in wide spaces, requires MIN - MOD cues to navigate the w/c through more narrow doorways, and tighter turns -  varies based on good vs bad vision days. 05/25/2022: Pt. Requires minA  and  cues to navigate the w/c in wide spaces, and requires Mod cues to navigate the w/c through more narrow doorways, and tighter turns. Pt. Requires max cues for scanning through the environment, and moderate cues for hand placement on the controls.     Goal status: Ongoing    8. Pt. Will improve bilateral grip strength to be able to independently grasp, and pull up blankets, and linens.      Baseline: 09/26/2022: R: 0  L: 8# Pt. is attempting to grasp, and pull blankets up more, using mostly the left hand. 08/15/2022: Pt. has difficulty securely holding, and pulling up blankets, and linens.    Goal status: Ongoing   9. Pt. will consistently actively control the releasing  of blankets, covers, and linens from his hands once they are in the desired position over him. Baseline: 09/26/2022: Pt. Is improving with actively releasing blankets form his hands. 08/15/2022: Pt. has difficulty with controlled releasing of blankets/linens from his grasp.     Goal status: Ongoing    10. Pt. Will improve bilateral UE strength by 2 MM grades to assist with ADLs, and IADLs     Baseline: Right: shoulder flexion: 3+/5, abduction: 4/5, elbow flexion: 4+/5 , extension: 4+/5 wrist extension: 2/5; Left: shoulder flexion: 4+/5, abduction: 4+/5, elbow flexion: 5/5 , extension: 4+/5 wrist extension: 3-/5   ASSESSMENT:  CLINICAL IMPRESSION:  Pt. reports having side effects from medication changes  Pt. presents with increased bilateral UE elbow, forearm, wrist, and digit tightness. With increased tenderness in  the right digits. Pt. reports that they have note been able to do much ROM, or stretching at home this past week due to illness. Pt. is improving with using his bilateral hands to doff his gown independently with increased time. Pt. is improving with, and continues to work on pulling blankets/sheets/linens up over him when in the recliner chair or bed. Pt. continues to work on improving BUE functioning, ROM, strength, motor control, [facility] skills, and visual compensatory strategies to be able to independently pull up, and release blankets/covers, improve writing legibility, improve self-care including oral care, and utensil use during self-feeding tasks, and maximize independence with ADLs, and IADLs.     PERFORMANCE DEFICITS in functional skills including ADLs, IADLs, coordination, dexterity, proprioception, ROM, strength, pain, FMC, GMC, decreased knowledge of use of DME, and UE functional use, cognitive skills including safety awareness, and psychosocial skills including coping strategies, environmental adaptation, habits, and routines and behaviors.   IMPAIRMENTS are limiting patient from ADLs, IADLs, leisure, and social participation.   COMORBIDITIES may have co-morbidities  that affects occupational performance. Patient will benefit from skilled OT to address above impairments and improve overall function.  MODIFICATION OR ASSISTANCE TO COMPLETE EVALUATION: Maximum or significant modification of tasks or assist is necessary to complete an evaluation.  OT OCCUPATIONAL PROFILE AND HISTORY: Comprehensive assessment: Review of records and extensive additional review of physical, cognitive, psychosocial history related to current functional performance.  CLINICAL DECISION MAKING: High - multiple treatment options, significant modification of task necessary  REHAB POTENTIAL: Fair    EVALUATION COMPLEXITY: High    PLAN: OT FREQUENCY: 2 x's a week  OT DURATION:12 weeks  PLANNED INTERVENTIONS:  self care/ADL training, therapeutic exercise, therapeutic activity, neuromuscular re-education, manual therapy, passive range of motion, patient/family education, and cognitive remediation/compensation  RECOMMENDED OTHER SERVICES: PT  CONSULTED AND AGREED WITH PLAN OF CARE: Patient and family member/caregiver  PLAN FOR NEXT SESSION: Initiate treatment  Olegario Messier, MS, OTR/L  10/11/2022

## 2022-10-13 NOTE — Addendum Note (Signed)
Addended by: Lenda Kelp on: 10/13/2022 11:11 AM   Modules accepted: Orders

## 2022-10-17 ENCOUNTER — Ambulatory Visit: Payer: BC Managed Care – PPO

## 2022-10-17 ENCOUNTER — Ambulatory Visit: Payer: BC Managed Care – PPO | Admitting: Occupational Therapy

## 2022-10-17 DIAGNOSIS — M6281 Muscle weakness (generalized): Secondary | ICD-10-CM | POA: Diagnosis not present

## 2022-10-17 DIAGNOSIS — R262 Difficulty in walking, not elsewhere classified: Secondary | ICD-10-CM

## 2022-10-17 DIAGNOSIS — R278 Other lack of coordination: Secondary | ICD-10-CM

## 2022-10-17 DIAGNOSIS — I6930 Unspecified sequelae of cerebral infarction: Secondary | ICD-10-CM | POA: Diagnosis not present

## 2022-10-17 DIAGNOSIS — R269 Unspecified abnormalities of gait and mobility: Secondary | ICD-10-CM | POA: Diagnosis not present

## 2022-10-17 DIAGNOSIS — R2681 Unsteadiness on feet: Secondary | ICD-10-CM | POA: Diagnosis not present

## 2022-10-17 NOTE — Therapy (Signed)
OUTPATIENT PHYSICAL THERAPY TREATMENT NOTE  Patient Name: Paul Hall MRN: [id number] DOB:03-03-1996, 27 y.o., male Today's Date: 10/17/2022  PCP:  Cindi Carbon PROVIDER:  Lenise Herald, PA-C   PT End of Session - 10/17/22 1703     Visit Number [id number]    Number of Visits 128    Date for PT Re-Evaluation 11/30/22    PT Start Time 1557    PT Stop Time 1617    PT Time Calculation (min) 20 min    Activity Tolerance Patient tolerated treatment well;No increased pain    Behavior During Therapy WFL for tasks assessed/performed             Past Medical History:  Diagnosis Date   Diabetes mellitus (HCC)    Hypertension    Stroke [facility])    Past Surgical History:  Procedure Laterality Date   IVC FILTER PLACEMENT ([facility] HX)     LEG SURGERY     PEG TUBE PLACEMENT     TRACHEOSTOMY     Patient Active Problem List   Diagnosis Date Noted   Sepsis due to vancomycin resistant Enterococcus species (HCC) 06/06/2019   SIRS (systemic inflammatory response syndrome) (HCC) 06/05/2019   Acute lower UTI 06/05/2019   VRE (vancomycin-resistant Enterococci) infection 06/05/2019   Anemia 06/05/2019   Skin ulcer of sacrum with necrosis of muscle (HCC)    Urinary retention    Type 2 diabetes mellitus without complication, with long-term current use of insulin (HCC)    Tachycardia    Lower extremity edema    Acute metabolic encephalopathy    Obstructive sleep apnea    Morbid obesity with BMI of 60.0-69.9, adult (HCC)    Goals of care, counseling/discussion    Palliative care encounter    Sepsis (HCC) 04/27/2019   H/O insulin dependent diabetes mellitus 04/27/2019   History of CVA with residual deficit 04/27/2019   Seizure disorder (HCC) 04/27/2019   Decubitus ulcer of sacral region, stage 4 (HCC) 04/27/2019    REFERRING DIAG: Cerebral infarction, unspecified   THERAPY DIAG:  Muscle weakness (generalized)  Other lack of coordination  History of CVA with residual  deficit  Abnormality of gait and mobility  Difficulty in walking, not elsewhere classified  Unsteadiness on feet  Rationale for Evaluation and Treatment Rehabilitation  PERTINENT HISTORY: Paul Hall is a 26yoM who presents with severe weakness, quadriparesis, altered sensorium, and visual impairment s/p critical illness and prolonged hospitalization. Pt hospitalized in October 2020 with ARDS 2/2 COVID19 infection. Pt sustained a complex and lengthy hospitalization which included tracheostomy, prolonged sedation, ECMO. In this period pt sustained CVA and SDH. Pt has now been liberated from tracheostomy and G-tube. Pt has since been hospitalized for wound infection and UTI. Pt lives with parents at home, has hospital bed and left chair, hoyer lift transfers, and power WC for mobility needs. Pt needs heavy physical assistance with ADL 2/2 BUE contractures and motor dysfunction   PRECAUTIONS: Fall  SUBJECTIVE:  Pt doing well today overall. No medical updates. Mom has been helping with stretching at home.    PAIN:   Are you having pain? No   TODAY'S TREATMENT:  - 10/17/22 P/ROM tissue lengthening and joint ROM 2x30sec each  -ankle DF -hip flexion -knee flexion -Hip ER  -knee extension/ hamstrings    Pt educated throughout session about proper posture and technique with exercises. Improved exercise technique, movement at target joints, use of target muscles after min to mod verbal, visual, tactile cues.    PATIENT  EDUCATION: Education details: standing posture and exercise technique Person educated: Patient Education method: Explanation, Demonstration, Tactile cues, and Verbal cues Education comprehension: verbalized understanding, returned demonstration, verbal cues required, tactile cues required, and needs further education   HOME EXERCISE PROGRAM:  Access Code: [email or web address] URL: [email or web address] Date: 03/23/2022 Prepared by: Maureen Ralphs  Exercises - Supine Bridge  - 3 x weekly - 3 sets - 10 reps - 2 hold - Supine Gluteal Sets  - 3 x weekly - 3 sets - 10 reps - 5 sec hold - Supine Quad Set  - 1 x daily - 3 x weekly - 3 sets - 10 reps - 5 hold - Seated Long Arc Quad  - 1 x daily - 7 x weekly - 3 sets - 10 reps - Seated Hip Adduction Squeeze with Ball  - 1 x daily - 3 x weekly - 3 sets - 10 reps - 5 hold - Seated Hip Abduction  - 1 x daily - 3 x weekly - 3 sets - 10 reps - 2 hold    PT Short Term Goals -       PT SHORT TERM GOAL #1   Title Pt will be independent with HEP in order to improve strength and balance in order to decrease fall risk and improve function at home and work.    Baseline 01/04/2021= No formal HEP in place; 12/12 no HEP in place; 05/10/2021-Patient and his father were able to report compliance with curent HEP consisting of mostly seated/reclined LE strengthening. Both verbalize no questions at this time.    Time 6    Period Weeks    Status Achieved    Target Date 02/15/21            PT LONG TERM GOAL #1     Title Patient will increase BLE gross strength by 1/2 muscle grade to improve functional strength for improved independence with potential gait, increased standing tolerance and increased ADL ability.     Baseline 01/04/2021- Patient presents with 1/5 to 3-/5 B LE strength with MMT; 12/12: goal partially met for Left knee/hip; 05/10/2021= 2-/5 bilateal Hip flex; 3+/5 bilateral Knee ext; 06/21/2021= Patient presents with 2-/5 bilateral Hip flex; 3+/5 bilateral knee ext/flex; 2-/5 left ankle DF; 0/5 right ankle- and able to increase reps and resistance with LE's. 09/15/2021- Patient technically presents with 2-/5 B hip flex/abd/add - but he is able to raise his hip up to approx 100 deg which has improved. 3+/5 Bilateral knee ext, 2-/5 left ankle and 0/5 right ankle.  12/08/2021= Patient able to lift left knee at 110 deg of hip flex; presents with 3+/5 knee ext, 2-/5 left ankle DF and 0/5 right ankle  DF, 2-/5 bilateral Hip abd in seated position.    12/6: R: knee 3+/5 ext, 2/5 flexion, left knee 3+/5 extension, 3+/5 flexion, R hip: 2+/5 hip add, 2+/5 hip ABD L hip: 4-/5 hip ABD, 3+/5 hip ADD, 3+/5 hip flexion; 06/06/2022= Patient now presents with 2-/5 right ankle DF/PF;     Time 12     Period Weeks     Status MET    Target Date 03/07/2022         PT LONG TERM GOAL #2    Title Patient will tolerate sitting unsupported demonstrating erect sitting posture for 15 minutes with CGA to demonstrate improved back extensor strength and improved sitting tolerance.     Baseline 01/04/2021- Patient confied to sitting in lift chair or electric power chair with back  support and unable to sit upright without physical assistance; 12/12: tolerates <1 minutes upright unsupported sitting. 05/10/2021=static sit with forward trunk lean  in his power wheelchair without back support x approx 3 min. 06/21/2021=Unable to assess today due to patient with acute back pain but on previous visit able to sit x 8 min without back support. 09/15/2021- on last visit- 09/13/2021- patient was able to sit unsupported x 8 min at edge of mat. 10/13/2023 - Patient was able to sit at edge of mat with varying level of assist today from SBA to min A for a total of 20 min. 12/13/2021= Patient demonstrated unsupported sitting at edge of mat for approx 20 min    Time 12     Period Weeks     Status GOAL MET    Target Date 12/08/21          PT LONG TERM GOAL #3    Title Patient will demonstrate ability to perform static standing in // bars > 2 min with Max Assist  without loss of balance and fair posture for improved overall strength for pre-gait and transfer activities.     Baseline 01/04/2021= Patient current uanble to stand- Dependent on hoyer or sit to stand lift for transfers. 05/10/2021=Not appropriate yet- Currently still dependent with all transfers using hoyer. 06/21/2021= Patient continuing now to focus on LE strengthening to prepare for  standing-unable to try today due to acute low back pain-  planning on attempting in new cert period. 09/15/2021- Patient has attempted standing 2x in past two week- max Assist of 2 people - only once was he successful to clearing his bottom from chair - Will continue to be a focus during the new certification. 12/13/2021= Patient has been limited secondary to increased overall low back pain during this certification and will require more time to focus on this goal.  12/6: not assessed this date, will assess at date when 2-3 PTs are present for assistance     Time 12     Period Weeks     Status GOAL not appropriate at this time - may attempt in future once Patient presents with improved overall LE strength.                 PT LONG TERM GOAL #4    Title Pt will improve FOTO score by 10 points or more demonstrating improved perceived functional ability     Baseline FOTO 7 on 10/17; 03/15/21: FOTO 12; 05/10/2021 06/21/2021= 1; 09/15/2021= 9; 12/13/2021= Will issue next visit 12/6: 4; 06/06/2022= Will assess next visit    Time 12     Period Weeks     Status ONGOING    Target date 11/30/2022         PT LONG TERM GOAL #5    Title Patient will perform sit to stand transfer with appropriate AD and max assist of 2 people with 75% consistency to prepare for pregait activities.     Baseline 09/15/2021= Patient unable to stand well- unable to clear his bottom off chair with Max assist of 2 persons. 12/13/2021- Goal not appropriate to try yet but will keep and roll over to next cert as shift continues to focus on transfers/standing; 4/24= Patient able to perform active ankle DF/PF with right LE and able to raise his knee into seated march and clear floor without physical assist today - as previously unable as well as lift right knee ext to near full ROM to improve strength for eventual standing.  09/07/2022- Goal continues to not be appropriate but patient is now standing some and will keep goal active    Time 12     Period  Weeks     Status Goal still not appropriate for now but will keep active for future    Target date         PT LONG TERM GOAL #6  Title Patient will tolerate sitting unsupported demonstrating erect sitting posture for 30 minutes with CGA to demonstrate improved back extensor strength and improved sitting tolerance.   Baseline 12/13/2021= Patient demonstrated unsupported sitting at edge of mat for approx 20 min;  12/29/2021- Patient performed approx 30 min of dynamic sitting activities today. 06/06/2022= Patient demonstrated ability to sit and perform static and dynamic UE/LE movement with only Supervision.   Time 12   Period Weeks   Status GOAL MET           7.  Patient will tolerate 5 minutes or more of standing in sit to stand lift or with max assist +2 in order to indicate improved lower extremity weightbearing tolerance for progression to standing in parallel bars. Baseline: 1 minute on most recent stand 02/21/22; 06/06/2022- Patient did attempt today after complaining of right LE pain last week. He attempted 3 stands using sit to stand lift. - all over 1 min- last one approx 48 sec- stopped due to fatigue. More erect standing in lift today - still poor gluteal strength but able to activate glutes and extend hips upon command but unable to hold > 5 sec. 07/28/2022- Will attempt standing next visit but patient has been able to stand since last progress note for up to 3 min at a time at his best. 09/07/2022-  attempted standing with max +2 and patient able to stand 15-20 sec so will now  revise goal Goal status: GOAL Revised/ongoing Target date: 11/30/2022   8. Patient will tolerate sitting unsupported demonstrating ability to perform dynamic UE/LE activities for 30 minutes  independently to demonstrate ability to sit at edge of bed to eat or perform some ADL's/exercise for optimal quality of life.             Baseline: 06/06/2022- Patient able to sit and perform some UE/LE exercises but requires CGA  at times for safety- he is able to static sit for 30 min with supervision. 07/27/2022= Patient able to now sit > 30 min with Supervision only - performing static and dynamic activities. Will keep goal active to ensure patient is consistent. 09/07/2022=  Patient able to demonstrate consistent ability to sit at edge of mat during visits             Goal Status: MET             Target date: 08/29/2022        Plan     Clinical Impression Statement Pt delayed arrival due to transportation anomaly. Pt asks to focus on stretching interventions with limited time. Postural features of power chair used to improve Product/process development scientist and pt comfort during stretching. Pt reports improves pain at end stretches and improved comfort overall by end of session. Pt will continue to benefit from skilled physical therapy intervention to address impairments, improve QOL, and attain therapy goals.    Personal Factors and Comorbidities Comorbidity 3+;Time since onset of injury/illness/exacerbation    Comorbidities CVA, diabetes, Seizures    Examination-Activity Limitations Bathing;Bed Mobility;Bend;Caring for Others;Carry;Dressing;Hygiene/Grooming;Lift;Locomotion Level;Reach Overhead;Self Feeding;Sit;Squat;Stairs;Stand;Transfers;Toileting    Examination-Participation Restrictions Cleaning;Community Activity;Driving;Laundry;Medication Management;Meal Prep;Occupation;Personal  Finances;Shop;Yard Work;Volunteer    Stability/Clinical Decision Making Evolving/Moderate complexity    Rehab Potential Fair    PT Frequency 2x / week    PT Duration 12 weeks    PT Treatment/Interventions ADLs/Self Care Home Management;Cryotherapy;Electrical Stimulation;Moist Heat;Ultrasound;DME Instruction;Gait training;Stair training;Functional mobility training;Therapeutic exercise;Balance training;Patient/family education;Orthotic Fit/Training;Neuromuscular re-education;Wheelchair mobility training;Manual techniques;Passive range of motion;Dry  needling;Energy conservation;Taping;Visual/perceptual remediation/compensation;Joint Manipulations    PT Next Visit Plan core strength- sitting at EOM, postural control, sit to stand if able; Continue with progressive LE Strengthening. Stand activities as appropriate.     PT Home Exercise Plan No changes to HEP today    Consulted and Agree with Plan of Care Patient;Family member/caregiver    Family Member Consulted Mom             5:05 PM, 10/17/22 Rosamaria Lints, PT, DPT Physical Therapist - [facility]  Outpatient Physical Therapy- [facility] [phone]     10/17/2022, 5:04 PM

## 2022-10-18 NOTE — Therapy (Addendum)
OCCUPATIONAL THERAPY NEURO TREATMENT NOTE  Patient Name: Paul Hall MRN: [id number] DOB:01-31-1996, 27 y.o., male Today's Date: 10/18/2022  PCP: Dr. Sherwood Gambler REFERRING PROVIDER: Dr. Sherwood Gambler   OT End of Session - 10/18/22 0951     Visit Number [id number]    Number of Visits 60    Date for OT Re-Evaluation 12/19/22    Authorization Type 09/26/2022    OT Start Time 1620    OT Stop Time 1700    OT Time Calculation (min) 40 min    Activity Tolerance Patient tolerated treatment well    Behavior During Therapy WFL for tasks assessed/performed                     Past Medical History:  Diagnosis Date   Diabetes mellitus (HCC)    Hypertension    Stroke [facility])    Past Surgical History:  Procedure Laterality Date   IVC FILTER PLACEMENT ([facility] HX)     LEG SURGERY     PEG TUBE PLACEMENT     TRACHEOSTOMY     Patient Active Problem List   Diagnosis Date Noted   Sepsis due to vancomycin resistant Enterococcus species (HCC) 06/06/2019   SIRS (systemic inflammatory response syndrome) (HCC) 06/05/2019   Acute lower UTI 06/05/2019   VRE (vancomycin-resistant Enterococci) infection 06/05/2019   Anemia 06/05/2019   Skin ulcer of sacrum with necrosis of muscle (HCC)    Urinary retention    Type 2 diabetes mellitus without complication, with long-term current use of insulin (HCC)    Tachycardia    Lower extremity edema    Acute metabolic encephalopathy    Obstructive sleep apnea    Morbid obesity with BMI of 60.0-69.9, adult (HCC)    Goals of care, counseling/discussion    Palliative care encounter    Sepsis (HCC) 04/27/2019   H/O insulin dependent diabetes mellitus 04/27/2019   History of CVA with residual deficit 04/27/2019   Seizure disorder (HCC) 04/27/2019   Decubitus ulcer of sacral region, stage 4 (HCC) 04/27/2019    ONSET DATE: 01/2019  REFERRING DIAG: CVA/COVID-19  THERAPY DIAG:  Muscle weakness (generalized)  Rationale for Evaluation and Treatment  Rehabilitation  SUBJECTIVE:   SUBJECTIVE STATEMENT:  Pt. reports feeling better today.  Pt accompanied by: self and family member  PERTINENT HISTORY:  Pt. is a 27 y.o. male who was diagnosed with COVID-19, and CVA with resultant quadriplegia. Pt. Was then hospitalized with VRE UTI. PMHx includes: urinary retention, seizure disorder, obstructive sleep disorder, DM Type II, Morbid obesity.   PRECAUTIONS: None  WEIGHT BEARING RESTRICTIONS No  PAIN:  Are you having pain? No. Positive for tenderness at the digits.   FALLS: Has patient fallen in last 6 months? No  LIVING ENVIRONMENT: Lives with: lives with their family Lives in: House/apartment Stairs: No Level Entry Has following equipment at home: Wheelchair (power) and hoyer lift, sit to stand lift  PLOF: Independent  PATIENT GOALS: To be able to engage in more daily care tasks.  OBJECTIVE:   HAND DOMINANCE: Right  ADLs:  Transfers/ambulation related to ADLs: Eating: Pt. reports being able to hold standard utensils, and is starting to engage more in self-feeding tasks, hand to mouth patterns. Pt. Reports that he does as much as he can with the task, and family assists  with the remainder of the task. Grooming: Pt. Is able to initiate holding an electric toothbrush, and brush his teeth. Family assists LB Dressing: Total Assist UE dressing: Pt. is now  able to reach up to actively assist with grasping , and pulling his gown down. Toileting:  Total Assist Bathing: MaxA UB, Total assist LB Tub Shower transfers: N/A Equipment: See above    IADLs: Shopping: Relies on family to assist Light housekeeping: Total Assist Meal Prep: Total Assist Community mobility:   Medication management:  Total Assist  Financial management: N/A Handwriting: Not legible: Pt. Is able to hold a pen with the left hand, and initiate marking the page. Pt.'s eye glasses were not available  MOBILITY STATUS:  Power w/c  POSTURE COMMENTS:  Pt.  Requires position changes in his power w/c  ACTIVITY TOLERANCE: Activity tolerance:  Fair  FUNCTIONAL OUTCOME MEASURES: FOTO: 40  TR score: 45  05/25/2022:   FOTO: 50 TR score: 45  07/20/22: FOTO 55  UPPER EXTREMITY ROM     Active ROM Right eval Right 03/21/2022 Right 05/25/2022 R  07/20/22 Right  07/25/22 Right 08/15/2022 Right 09/26/2022 Left eval Left  03/21/2022 Left 05/25/2022 L  07/20/22 Left 07/25/2022 Left  08/15/2022 Left  09/26/2022  Shoulder flexion 106 scaption 105  Scaption 62 flexion 110 scaption 100  105 105 119 123 125 130  136 138  Shoulder abduction 114 90 97 100  105 117 110 115 115 115  118 121  Shoulder adduction                Shoulder extension                Shoulder internal rotation                Shoulder external rotation                Elbow flexion 120(130) 145 145 140  144 140 135(135) 145 145   145   Elbow extension -45(-35) -22(-35) -28(-22)  -32(-21) -32(-28) -28(-26) -27(-18) -21(-20) -20(-16)  -25(-10) -23(-10) -21(-10)  Wrist flexion                Wrist extension -30(10) -25(10) -10(30)  -10(20) -10(25) -20(40) 10(50) 19(50) 28(50)  30(50) 35(55) 36(55)  Wrist ulnar deviation                Wrist radial deviation                Wrist pronation                Wrist supination   Limited by flexor tone       Limited by flexor tone      (Blank rows = not tested)      UPPER EXTREMITY MMT:     Right eval Right 03/21/2022 Right  05/25/2022 R 07/20/22 Right 08/25/2022 Right 09/26/2022 Left eval Left 03/21/2022 Left  05/25/2022 L 07/20/22 Left  08/15/2022 Left 09/26/2022  Shoulder flexion 3-/5 scaption 3-/5 3-/5 3+/5 3+/5 3+/5 3-/5 3/5 3+/5 4+/5 4+/5 4+/5  Shoulder abduction 3-/5 3-/5 3-/5 4/5 4/5 4/5 3-/5 3-/5 4-/5 4+/5 4+/5 4+5  Shoulder adduction              Shoulder extension              Shoulder internal rotation              Shoulder external rotation              Elbow flexion 3/5 3/5 3+/5 4/5 4+/5 4+/5 3/5 3+/5 4-/5 4+/5 5/5 5/5   Elbow extension 2-/5 2/5 3-/5 4+/5 4+/5 4+/5 2-/5 2/5  4+/5 4+/5 4+/5  Wrist flexion              Wrist extension 2-/5 2-/5 2/5 2/5 2/5 2/5 2/5 2+/5 3-/5  3-/5 3-/5  Wrist ulnar deviation              Wrist radial deviation              Wrist pronation              Wrist supination              (Blank rows = not tested)     HAND FUNCTION:  Grip strength: Right: 0 lbs; Left: 0 lbs and Lateral pinch: Right: 5 lbs, Left: 2 lbs  03/21/2022: Lateral pinch: Right: 3.5 lbs, Left: 2 lbs  07/20/22: Grip strength: Right: 3 lbs; Left: 4 lbs and Lateral pinch: Right: 5 lbs, Left: 3 lbs  08/15/2022:  Grip strength: Right: 0 lbs; Left: 5 lbs and Lateral pinch: Right: 2 lbs, Left: 4 lbs  09/26/2022  Grip strength: Right: 0 lbs; Left: 8 lbs and Lateral pinch: Right: 2 lbs, Left: 5 lbs   Bilateral digit PIP/DIP flexion contractures with MP hyperextension with attempts for AROM. Pt. is able to tolerate AROM to the bilateral digits at the initial evaluation however, has a history of pain in the digits.  COORDINATION:  Eval: Pt. is unable to grasp 9-hole test pegs. Pt. is able to initiate grasping larger pegs, and is able to hold a pen in the left hand.  07/20/22: 2 min 36 seconds to remove 9 pegs from 9 hole peg test - cues to locate pegs 2/2 low vision. Pt. is able to initiate grasping larger pegs on R hand and is able to hold a pen in the left hand.  08/15/2022:      Vision, and sensation limiting accuracy of 9 hole peg test results. Pt. Was able to grasp and remove vertical pegs intermittently with cues.    SENSATION: Light touch: Impaired   EDEMA:  N/A  MUSCLE TONE: BUE flexor Spasticity  COGNITION Overall cognitive status: Continue to assess in functional context  VISION:   Subjective report: Pt. was not wearing glasses at the time of the initial eval.  Baseline vision: Vision is very limited. Wears glasses all the time Visual history: History of impaired vision following  CVA. Pt. Has received treatment through the [facility] low vision rehabilitation program.   VISION ASSESSMENT: Impaired To be further assessed in functional context  PERCEPTION: Impaired   PRAXIS: Impaired: motor planning  OBSERVATIONS:  Pt reports being on Tramadol   TODAY'S TREATMENT     Therapeutic EX.:   Pt. worked on [employer] with PROM stretching to the end range for bilateral shoulder flexion, abduction, elbow flexion, extension, prolonged stretching for bilateral forearm supination, wrist extension, digit MP, PIP, and DIP extension, thumb radial abduction.  Self-care:   Pt. worked on bilateral hand [facility] skills grasping sheets while alternating bilateral hands to intentionally pull it up over him.      PATIENT EDUCATION: Education details: There. Halford Decamp Person educated: Patient and Parent Education method: Explanation, Demonstration, Tactile cues, and Verbal cues Education comprehension: verbalized understanding, returned demonstration, verbal cues required, and needs further education   HOME EXERCISE PROGRAM:   Continue ongoing assessment, and continue to provide as needed.     GOALS: Goals reviewed with patient? Yes  SHORT TERM GOALS: Target date: 11/07/2022         To assess splint  fit, and make appropriate adjustments to promote good skin integrity through the palmar surface of the bilateral hands.  Baseline: 05/25/22: Goal currently met, however ongoing as needs to assess splint fit arise. 03/23/2022: Pt. is wearing splints a couple of hours at night bilateral resting hand splints. 03/21/2022: Pt. is wearing splints a couple of hours at night bilateral resting hand splints. Goal status: Continue ongoing assessment as requested, and as needed   LONG TERM GOALS: Target date: 12/19/2022      FOTO score will Improve by 2 points for Pt. perceived improvement with the assessment specific ADL/IADL tasks.  Baseline: 09/27/2022: 51 07/20/2022: FOTO 55 05/25/2022: FOTO  score: 50,  TR score: 45 Eval: FOTO score: 40,  TR score: 45 Goal status: Achieved   2.   Pt. will independently perform oral care for 100% of the task after complete set-up. Baseline: 09/26/2022: Completes 90% of the task, proximal assist at times required at the elbow.  08/15/2022: Completes 90% of the task, proximal assist at times required at the elbow. 07/20/22: completes 90% of task, limited by shoulder flexion. 05/25/2022:  Pt. Is able to initiate and perform oral care for approximately 90% of the task. Complete set-up required. Assi needed only for the very back teeth. 03/23/2022: Pt. Is able to initiate and perform oral care for approximately 75% of the task. Pt. Requires assist at proximally at the elbow for through oral care. 03/21/2022: Pt. Is able to initiate and perform oral care for approximately 75% of the task. Pt. Requires assist at proximally at the elbow for through oral care. Eval: Pt. is able to initiate using an electric toothbrush. Pt. requires assist for set-up, and assist for thoroughness, and as he Pt. fatigues. Goal status: Ongoing  3.  Pt. Will be independent with self-feeding for 100% of the meal after complete set-up Baseline:09/26/2022: 90% using the spoon, assit at time with the forearm motion, and grip on the utensils. 100% for finger foods.  08/15/2022:  90% using the spoon, assit at time with the forearm motion, and grip on the utensils. 100% for finger foods-independent with set-up- unsupervised.  07/20/22: self-feeds cereal using spoon 90% of task. 05/25/2022: Pt. Is able to use a spoon to scoop cereal when feeding himself cereal 85% of the time. Pt. Is able to feed himself snack/finger foods 100% of the time. Pt. Continues to work on consistency of  stabilizing a cup/mug when drinking. Pt. Is able to grasp a water bottle with assist initially, with assist tapering off as he drinks.03/23/2022: Pt. Is able to perform scooping cereal for 75% of the time. Pt. required assist, and  support at the left elbow, and Pt. Presents with limited forearm supination when using the spoon, and bringing it towards his mouth. Pt. Is able to use a fork to spear items, and perform the hand to mouth pattern.  03/21/2022: Pt. Is able to perform scooping cereal for 75% of the time. Pt. required assist, and support at the left elbow, and Pt. presents with limited forearm supination when using the spoon, and bringing it towards his mouth. Pt. Is able to use a fork to spear items, and perform the hand to mouth pattern.  Eval: Pt. is able to hold standard standard utensils. Pt. Performs as much of the task as he, can and has assistance for the remainder. Goal status: Ongoing  4.  Pt. Will improve grasp patterns and consistently grasp 1/4" objects for ADL, and IADL tasks.  Baseline: 09/26/2022: Continues to grasp  1/4" pegs with min a + visual cues, consistently grasping 1/2" objects with visual cues. 08/15/2022: grasps 1/4" pegs with min a + visual cues, consistently grasping 1/2" objects with visual cues. 07/20/22: grasps 1/4" pegs with min a + visual cues, consistently grasping 1/2" objects with visual cues. 05/25/2022: Pt. Is working on improving consistency of grasping 1/2" objects with visual cues.  03/23/2022: Pt. Is able to grasp 1" objects consistently,and continues to work on the hand patterns needed to grasp 1/2" objects.03/21/2022: Pt. Is able to grasp 1" objects consistently,and continues to work on the hand patterns needed to grasp 1/2" objects. Eval: Pt. is able to grasp 1" objects intermittently using a lateral grasp pattern. Goal status: Progressing; goal revised to 1/4" items  5.  Pt. will independently write his name legibly with letter sizes under 1". Baseline: 09/26/2022: Pt. Continues to be able to write name with smaller letter size. Pt. Is signing name with a computer styus. Pt. Requires visual cues, and assist. 08/15/2022: Pt. Is able to write name with smaller letter size. Pt. Is signing  name with a computer styus. Pt. Requires visual cues, and assist. 07/20/22: stabilizing assist to write name with 75% legibility with 2" letters. 05/25/2022: Pt. to continue to work towards formulating grasp patterns in preparation for grasping a large width pen. Pt. Requires visual cues. 03/23/2022: Pt. Is able to write his name with modA, however has difficulty with formulating letter sizes less than 2" in size with 50% legibility for the 3 letters of his name.03/21/2022: Pt. Is able to write his name with modA, however has difficulty with formulating letter sizes less than 2" in size with 50% legibility for the 3 letters of his name. Eval: Pt is able to hold a thin marker with his left hand, and formulate a line, and initiate a circular pattern (Pt. without glasses today) Goal status: Progressing; Goal revised for more control with smaller writing size  6. Pt. Will reach up to comb/brush his hair  with minA.  Baseline: 09/26/2022: Pt. Is able to use a hair pick for the left side of his head using his left hand. Pt. Presents with difficulty reaching to the right side of his head. 5/13/202: 75% using a hair pick, assist to the right side of the head. 07/20/22: reaches 75% of head, assist for far R side of head, fatigues quickly. 05/25/2022: Pt. Is able to reach up with the left hand to the left side, top, and back of his head. 03/23/2022: Pt. is now able to more consistently initiate reaching up to his head with his left hand in preparation for haircare12/18/2023: Pt. is now able to more consistently initiate reaching up to his head with his left hand in preparation for haircare. Eval: Pt. is able to initiate reaching up for hair care with a long handled brush, however is unable to sustain UE's in elevation to perform the task.     Goal status: Ongoing  7. Pt. Will independently navigate the w/c through his environment with minA with visual scanning, and hand placement on the controls.  Baseline: 09/26/2022: Pt.  Is now navigating his w/c ouside the home, and around his block. 08/15/2022: Pt. Requires minA to setup hand on controls and MIN cues to navigate the w/c in wide spaces, requires MIN - MOD cues to navigate the w/c through more narrow doorways, and tighter turns - varies based on good vs bad vision days.07/20/22: Pt. Requires minA to setup hand on controls and MIN cues to  navigate the w/c in wide spaces, requires MIN - MOD cues to navigate the w/c through more narrow doorways, and tighter turns - varies based on good vs bad vision days. 05/25/2022: Pt. Requires minA  and  cues to navigate the w/c in wide spaces, and requires Mod cues to navigate the w/c through more narrow doorways, and tighter turns. Pt. Requires max cues for scanning through the environment, and moderate cues for hand placement on the controls.     Goal status: Ongoing    8. Pt. Will improve bilateral grip strength to be able to independently grasp, and pull up blankets, and linens.      Baseline: 09/26/2022: R: 0  L: 8# Pt. is attempting to grasp, and pull blankets up more, using mostly the left hand. 08/15/2022: Pt. has difficulty securely holding, and pulling up blankets, and linens.    Goal status: Ongoing   9. Pt. will consistently actively control the releasing  of blankets, covers, and linens from his hands once they are in the desired position over him. Baseline: 09/26/2022: Pt. Is improving with actively releasing blankets form his hands. 08/15/2022: Pt. has difficulty with controlled releasing of blankets/linens from his grasp.     Goal status: Ongoing    10. Pt. Will improve bilateral UE strength by 2 MM grades to assist with ADLs, and IADLs     Baseline: Right: shoulder flexion: 3+/5, abduction: 4/5, elbow flexion: 4+/5 , extension: 4+/5 wrist extension: 2/5; Left: shoulder flexion: 4+/5, abduction: 4+/5, elbow flexion: 5/5 , extension: 4+/5 wrist extension: 3-/5   ASSESSMENT:  CLINICAL IMPRESSION:  Pt. Reports feeling  better today. Pt. presents with right elbow, forearm, wrist, and digit PIP, and DIP flexor tightness. Pt. presents with increased tenderness in the right digits. Pt. reports that they have been able to do more stretching this past weekend. Pt. is improving with using his bilateral hands to doff his gown independently with increased time. Pt. Was able to alternate bilateral hand use to manipulate the sheet, reposition the sheet, as well as pull it up, and down. Pt. is improving with, and continues to work on pulling sheets up over him when in the recliner chair or bed. Pt. continues to work on improving BUE functioning, ROM, strength, motor control, [facility] skills, and visual compensatory strategies to be able to independently pull up, and release blankets/covers, improve writing legibility, improve self-care including oral care, and utensil use during self-feeding tasks, and maximize independence with ADLs, and IADLs.     PERFORMANCE DEFICITS in functional skills including ADLs, IADLs, coordination, dexterity, proprioception, ROM, strength, pain, FMC, GMC, decreased knowledge of use of DME, and UE functional use, cognitive skills including safety awareness, and psychosocial skills including coping strategies, environmental adaptation, habits, and routines and behaviors.   IMPAIRMENTS are limiting patient from ADLs, IADLs, leisure, and social participation.   COMORBIDITIES may have co-morbidities  that affects occupational performance. Patient will benefit from skilled OT to address above impairments and improve overall function.  MODIFICATION OR ASSISTANCE TO COMPLETE EVALUATION: Maximum or significant modification of tasks or assist is necessary to complete an evaluation.  OT OCCUPATIONAL PROFILE AND HISTORY: Comprehensive assessment: Review of records and extensive additional review of physical, cognitive, psychosocial history related to current functional performance.  CLINICAL DECISION MAKING: High -  multiple treatment options, significant modification of task necessary  REHAB POTENTIAL: Fair    EVALUATION COMPLEXITY: High    PLAN: OT FREQUENCY: 2 x's a week  OT DURATION:12 weeks  PLANNED INTERVENTIONS:  self care/ADL training, therapeutic exercise, therapeutic activity, neuromuscular re-education, manual therapy, passive range of motion, patient/family education, and cognitive remediation/compensation  RECOMMENDED OTHER SERVICES: PT  CONSULTED AND AGREED WITH PLAN OF CARE: Patient and family member/caregiver  PLAN FOR NEXT SESSION: Initiate treatment  Olegario Messier, MS, OTR/L  10/18/2022

## 2022-10-19 ENCOUNTER — Ambulatory Visit: Payer: BC Managed Care – PPO | Admitting: Occupational Therapy

## 2022-10-19 ENCOUNTER — Ambulatory Visit: Payer: BC Managed Care – PPO

## 2022-10-19 DIAGNOSIS — R2681 Unsteadiness on feet: Secondary | ICD-10-CM | POA: Diagnosis not present

## 2022-10-19 DIAGNOSIS — R278 Other lack of coordination: Secondary | ICD-10-CM | POA: Diagnosis not present

## 2022-10-19 DIAGNOSIS — R269 Unspecified abnormalities of gait and mobility: Secondary | ICD-10-CM | POA: Diagnosis not present

## 2022-10-19 DIAGNOSIS — R262 Difficulty in walking, not elsewhere classified: Secondary | ICD-10-CM | POA: Diagnosis not present

## 2022-10-19 DIAGNOSIS — M6281 Muscle weakness (generalized): Secondary | ICD-10-CM

## 2022-10-19 DIAGNOSIS — I6930 Unspecified sequelae of cerebral infarction: Secondary | ICD-10-CM | POA: Diagnosis not present

## 2022-10-19 NOTE — Therapy (Addendum)
OCCUPATIONAL THERAPY NEURO TREATMENT NOTE  Patient Name: Paul Hall MRN: [id number] DOB:11-20-1995, 27 y.o., male Today's Date: 10/19/2022  PCP: Dr. Sherwood Gambler REFERRING PROVIDER: Dr. Sherwood Gambler   OT End of Session - 10/19/22 1710     Visit Number [id number]    Number of Visits 60    Date for OT Re-Evaluation 12/19/22    Authorization Type 09/26/2022    OT Start Time 1620    OT Stop Time 1700    OT Time Calculation (min) 40 min    Equipment Utilized During Treatment tilt in space power wc    Activity Tolerance Patient tolerated treatment well    Behavior During Therapy WFL for tasks assessed/performed                     Past Medical History:  Diagnosis Date   Diabetes mellitus (HCC)    Hypertension    Stroke [facility])    Past Surgical History:  Procedure Laterality Date   IVC FILTER PLACEMENT ([facility] HX)     LEG SURGERY     PEG TUBE PLACEMENT     TRACHEOSTOMY     Patient Active Problem List   Diagnosis Date Noted   Sepsis due to vancomycin resistant Enterococcus species (HCC) 06/06/2019   SIRS (systemic inflammatory response syndrome) (HCC) 06/05/2019   Acute lower UTI 06/05/2019   VRE (vancomycin-resistant Enterococci) infection 06/05/2019   Anemia 06/05/2019   Skin ulcer of sacrum with necrosis of muscle (HCC)    Urinary retention    Type 2 diabetes mellitus without complication, with long-term current use of insulin (HCC)    Tachycardia    Lower extremity edema    Acute metabolic encephalopathy    Obstructive sleep apnea    Morbid obesity with BMI of 60.0-69.9, adult (HCC)    Goals of care, counseling/discussion    Palliative care encounter    Sepsis (HCC) 04/27/2019   H/O insulin dependent diabetes mellitus 04/27/2019   History of CVA with residual deficit 04/27/2019   Seizure disorder (HCC) 04/27/2019   Decubitus ulcer of sacral region, stage 4 (HCC) 04/27/2019    ONSET DATE: 01/2019  REFERRING DIAG: CVA/COVID-19  THERAPY DIAG:  Muscle weakness  (generalized)  Other lack of coordination  Rationale for Evaluation and Treatment Rehabilitation  SUBJECTIVE:   SUBJECTIVE STATEMENT:  Pt. reports he is doing well today and had a good PT session prior to OT.  Pt accompanied by: self and family member  PERTINENT HISTORY:  Pt. is a 27 y.o. male who was diagnosed with COVID-19, and CVA with resultant quadriplegia. Pt. Was then hospitalized with VRE UTI. PMHx includes: urinary retention, seizure disorder, obstructive sleep disorder, DM Type II, Morbid obesity.   PRECAUTIONS: None  WEIGHT BEARING RESTRICTIONS No  PAIN:  Are you having pain? No pain reported  FALLS: Has patient fallen in last 6 months? No  LIVING ENVIRONMENT: Lives with: lives with their family Lives in: House/apartment Stairs: No Level Entry Has following equipment at home: Wheelchair (power) and hoyer lift, sit to stand lift  PLOF: Independent  PATIENT GOALS: To be able to engage in more daily care tasks.  OBJECTIVE:   HAND DOMINANCE: Right  ADLs:  Transfers/ambulation related to ADLs: Eating: Pt. reports being able to hold standard utensils, and is starting to engage more in self-feeding tasks, hand to mouth patterns. Pt. Reports that he does as much as he can with the task, and family assists  with the remainder of the task. Grooming: Pt.  Is able to initiate holding an electric toothbrush, and brush his teeth. Family assists LB Dressing: Total Assist UE dressing: Pt. is now able to reach up to actively assist with grasping , and pulling his gown down. Toileting:  Total Assist Bathing: MaxA UB, Total assist LB Tub Shower transfers: N/A Equipment: See above    IADLs: Shopping: Relies on family to assist Light housekeeping: Total Assist Meal Prep: Total Assist Community mobility:   Medication management:  Total Assist  Financial management: N/A Handwriting: Not legible: Pt. Is able to hold a pen with the left hand, and initiate marking the  page. Pt.'s eye glasses were not available  MOBILITY STATUS:  Power w/c  POSTURE COMMENTS:  Pt. Requires position changes in his power w/c  ACTIVITY TOLERANCE: Activity tolerance:  Fair  FUNCTIONAL OUTCOME MEASURES: FOTO: 40  TR score: 45  05/25/2022:   FOTO: 50 TR score: 45  07/20/22: FOTO 55  UPPER EXTREMITY ROM     Active ROM Right eval Right 03/21/2022 Right 05/25/2022 R  07/20/22 Right  07/25/22 Right 08/15/2022 Right 09/26/2022 Left eval Left  03/21/2022 Left 05/25/2022 L  07/20/22 Left 07/25/2022 Left  08/15/2022 Left  09/26/2022  Shoulder flexion 106 scaption 105  Scaption 62 flexion 110 scaption 100  105 105 119 123 125 130  136 138  Shoulder abduction 114 90 97 100  105 117 110 115 115 115  118 121  Shoulder adduction                Shoulder extension                Shoulder internal rotation                Shoulder external rotation                Elbow flexion 120(130) 145 145 140  144 140 135(135) 145 145   145   Elbow extension -45(-35) -22(-35) -28(-22)  -32(-21) -32(-28) -28(-26) -27(-18) -21(-20) -20(-16)  -25(-10) -23(-10) -21(-10)  Wrist flexion                Wrist extension -30(10) -25(10) -10(30)  -10(20) -10(25) -20(40) 10(50) 19(50) 28(50)  30(50) 35(55) 36(55)  Wrist ulnar deviation                Wrist radial deviation                Wrist pronation                Wrist supination   Limited by flexor tone       Limited by flexor tone      (Blank rows = not tested)      UPPER EXTREMITY MMT:     Right eval Right 03/21/2022 Right  05/25/2022 R 07/20/22 Right 08/25/2022 Right 09/26/2022 Left eval Left 03/21/2022 Left  05/25/2022 L 07/20/22 Left  08/15/2022 Left 09/26/2022  Shoulder flexion 3-/5 scaption 3-/5 3-/5 3+/5 3+/5 3+/5 3-/5 3/5 3+/5 4+/5 4+/5 4+/5  Shoulder abduction 3-/5 3-/5 3-/5 4/5 4/5 4/5 3-/5 3-/5 4-/5 4+/5 4+/5 4+5  Shoulder adduction              Shoulder extension              Shoulder internal rotation               Shoulder external rotation              Elbow flexion  3/5 3/5 3+/5 4/5 4+/5 4+/5 3/5 3+/5 4-/5 4+/5 5/5 5/5  Elbow extension 2-/5 2/5 3-/5 4+/5 4+/5 4+/5 2-/5 2/5  4+/5 4+/5 4+/5  Wrist flexion              Wrist extension 2-/5 2-/5 2/5 2/5 2/5 2/5 2/5 2+/5 3-/5  3-/5 3-/5  Wrist ulnar deviation              Wrist radial deviation              Wrist pronation              Wrist supination              (Blank rows = not tested)     HAND FUNCTION:  Grip strength: Right: 0 lbs; Left: 0 lbs and Lateral pinch: Right: 5 lbs, Left: 2 lbs  03/21/2022: Lateral pinch: Right: 3.5 lbs, Left: 2 lbs  07/20/22: Grip strength: Right: 3 lbs; Left: 4 lbs and Lateral pinch: Right: 5 lbs, Left: 3 lbs  08/15/2022:  Grip strength: Right: 0 lbs; Left: 5 lbs and Lateral pinch: Right: 2 lbs, Left: 4 lbs  09/26/2022  Grip strength: Right: 0 lbs; Left: 8 lbs and Lateral pinch: Right: 2 lbs, Left: 5 lbs   Bilateral digit PIP/DIP flexion contractures with MP hyperextension with attempts for AROM. Pt. is able to tolerate AROM to the bilateral digits at the initial evaluation however, has a history of pain in the digits.  COORDINATION:  Eval: Pt. is unable to grasp 9-hole test pegs. Pt. is able to initiate grasping larger pegs, and is able to hold a pen in the left hand.  07/20/22: 2 min 36 seconds to remove 9 pegs from 9 hole peg test - cues to locate pegs 2/2 low vision. Pt. is able to initiate grasping larger pegs on R hand and is able to hold a pen in the left hand.  08/15/2022:      Vision, and sensation limiting accuracy of 9 hole peg test results. Pt. Was able to grasp and remove vertical pegs intermittently with cues.    SENSATION: Light touch: Impaired   EDEMA:  N/A  MUSCLE TONE: BUE flexor Spasticity  COGNITION Overall cognitive status: Continue to assess in functional context  VISION:   Subjective report: Pt. was not wearing glasses at the time of the initial eval.  Baseline  vision: Vision is very limited. Wears glasses all the time Visual history: History of impaired vision following CVA. Pt. Has received treatment through the [facility] low vision rehabilitation program.   VISION ASSESSMENT: Impaired To be further assessed in functional context  PERCEPTION: Impaired   PRAXIS: Impaired: motor planning  OBSERVATIONS:  Pt reports being on Tramadol   TODAY'S TREATMENT    Therapeutic EX.:   Pt. worked on [employer] with PROM stretching to the end range for bilateral shoulder flexion, abduction, elbow flexion, extension, prolonged stretching for bilateral forearm supination, wrist extension, digit MP, PIP, and DIP extension, thumb radial abduction. Pt. completed seated BUE therex using boxing speed bag set at head height; intermittent rest breaks 4# wrist weight to L arm.   Self-care:   Pt. worked on bilateral hand [facility] skills grasping sheets while alternating bilateral hands to intentionally pull it up over him.      PATIENT EDUCATION: Education details: There. Halford Decamp Person educated: Patient and Parent Education method: Explanation, Demonstration, Tactile cues, and Verbal cues Education comprehension: verbalized understanding, returned demonstration, verbal cues required, and needs  further education   HOME EXERCISE PROGRAM:   Continue ongoing assessment, and continue to provide as needed.     GOALS: Goals reviewed with patient? Yes  SHORT TERM GOALS: Target date: 11/07/2022         To assess splint fit, and make appropriate adjustments to promote good skin integrity through the palmar surface of the bilateral hands.  Baseline: 05/25/22: Goal currently met, however ongoing as needs to assess splint fit arise. 03/23/2022: Pt. is wearing splints a couple of hours at night bilateral resting hand splints. 03/21/2022: Pt. is wearing splints a couple of hours at night bilateral resting hand splints. Goal status: Continue ongoing assessment as requested,  and as needed   LONG TERM GOALS: Target date: 12/19/2022      FOTO score will Improve by 2 points for Pt. perceived improvement with the assessment specific ADL/IADL tasks.  Baseline: 09/27/2022: 51 07/20/2022: FOTO 55 05/25/2022: FOTO score: 50,  TR score: 45 Eval: FOTO score: 40,  TR score: 45 Goal status: Achieved   2.   Pt. will independently perform oral care for 100% of the task after complete set-up. Baseline: 09/26/2022: Completes 90% of the task, proximal assist at times required at the elbow.  08/15/2022: Completes 90% of the task, proximal assist at times required at the elbow. 07/20/22: completes 90% of task, limited by shoulder flexion. 05/25/2022:  Pt. Is able to initiate and perform oral care for approximately 90% of the task. Complete set-up required. Assi needed only for the very back teeth. 03/23/2022: Pt. Is able to initiate and perform oral care for approximately 75% of the task. Pt. Requires assist at proximally at the elbow for through oral care. 03/21/2022: Pt. Is able to initiate and perform oral care for approximately 75% of the task. Pt. Requires assist at proximally at the elbow for through oral care. Eval: Pt. is able to initiate using an electric toothbrush. Pt. requires assist for set-up, and assist for thoroughness, and as he Pt. fatigues. Goal status: Ongoing  3.  Pt. Will be independent with self-feeding for 100% of the meal after complete set-up Baseline:09/26/2022: 90% using the spoon, assit at time with the forearm motion, and grip on the utensils. 100% for finger foods.  08/15/2022:  90% using the spoon, assit at time with the forearm motion, and grip on the utensils. 100% for finger foods-independent with set-up- unsupervised.  07/20/22: self-feeds cereal using spoon 90% of task. 05/25/2022: Pt. Is able to use a spoon to scoop cereal when feeding himself cereal 85% of the time. Pt. Is able to feed himself snack/finger foods 100% of the time. Pt. Continues to work on  consistency of  stabilizing a cup/mug when drinking. Pt. Is able to grasp a water bottle with assist initially, with assist tapering off as he drinks.03/23/2022: Pt. Is able to perform scooping cereal for 75% of the time. Pt. required assist, and support at the left elbow, and Pt. Presents with limited forearm supination when using the spoon, and bringing it towards his mouth. Pt. Is able to use a fork to spear items, and perform the hand to mouth pattern.  03/21/2022: Pt. Is able to perform scooping cereal for 75% of the time. Pt. required assist, and support at the left elbow, and Pt. presents with limited forearm supination when using the spoon, and bringing it towards his mouth. Pt. Is able to use a fork to spear items, and perform the hand to mouth pattern.  Eval: Pt. is able to hold  standard standard utensils. Pt. Performs as much of the task as he, can and has assistance for the remainder. Goal status: Ongoing  4.  Pt. Will improve grasp patterns and consistently grasp 1/4" objects for ADL, and IADL tasks.  Baseline: 09/26/2022: Continues to grasp 1/4" pegs with min a + visual cues, consistently grasping 1/2" objects with visual cues. 08/15/2022: grasps 1/4" pegs with min a + visual cues, consistently grasping 1/2" objects with visual cues. 07/20/22: grasps 1/4" pegs with min a + visual cues, consistently grasping 1/2" objects with visual cues. 05/25/2022: Pt. Is working on improving consistency of grasping 1/2" objects with visual cues.  03/23/2022: Pt. Is able to grasp 1" objects consistently,and continues to work on the hand patterns needed to grasp 1/2" objects.03/21/2022: Pt. Is able to grasp 1" objects consistently,and continues to work on the hand patterns needed to grasp 1/2" objects. Eval: Pt. is able to grasp 1" objects intermittently using a lateral grasp pattern. Goal status: Progressing; goal revised to 1/4" items  5.  Pt. will independently write his name legibly with letter sizes under  1". Baseline: 09/26/2022: Pt. Continues to be able to write name with smaller letter size. Pt. Is signing name with a computer styus. Pt. Requires visual cues, and assist. 08/15/2022: Pt. Is able to write name with smaller letter size. Pt. Is signing name with a computer styus. Pt. Requires visual cues, and assist. 07/20/22: stabilizing assist to write name with 75% legibility with 2" letters. 05/25/2022: Pt. to continue to work towards formulating grasp patterns in preparation for grasping a large width pen. Pt. Requires visual cues. 03/23/2022: Pt. Is able to write his name with modA, however has difficulty with formulating letter sizes less than 2" in size with 50% legibility for the 3 letters of his name.03/21/2022: Pt. Is able to write his name with modA, however has difficulty with formulating letter sizes less than 2" in size with 50% legibility for the 3 letters of his name. Eval: Pt is able to hold a thin marker with his left hand, and formulate a line, and initiate a circular pattern (Pt. without glasses today) Goal status: Progressing; Goal revised for more control with smaller writing size  6. Pt. Will reach up to comb/brush his hair  with minA.  Baseline: 09/26/2022: Pt. Is able to use a hair pick for the left side of his head using his left hand. Pt. Presents with difficulty reaching to the right side of his head. 5/13/202: 75% using a hair pick, assist to the right side of the head. 07/20/22: reaches 75% of head, assist for far R side of head, fatigues quickly. 05/25/2022: Pt. Is able to reach up with the left hand to the left side, top, and back of his head. 03/23/2022: Pt. is now able to more consistently initiate reaching up to his head with his left hand in preparation for haircare12/18/2023: Pt. is now able to more consistently initiate reaching up to his head with his left hand in preparation for haircare. Eval: Pt. is able to initiate reaching up for hair care with a long handled brush, however  is unable to sustain UE's in elevation to perform the task.     Goal status: Ongoing  7. Pt. Will independently navigate the w/c through his environment with minA with visual scanning, and hand placement on the controls.  Baseline: 09/26/2022: Pt. Is now navigating his w/c ouside the home, and around his block. 08/15/2022: Pt. Requires minA to setup hand on controls and  MIN cues to navigate the w/c in wide spaces, requires MIN - MOD cues to navigate the w/c through more narrow doorways, and tighter turns - varies based on good vs bad vision days.07/20/22: Pt. Requires minA to setup hand on controls and MIN cues to navigate the w/c in wide spaces, requires MIN - MOD cues to navigate the w/c through more narrow doorways, and tighter turns - varies based on good vs bad vision days. 05/25/2022: Pt. Requires minA  and  cues to navigate the w/c in wide spaces, and requires Mod cues to navigate the w/c through more narrow doorways, and tighter turns. Pt. Requires max cues for scanning through the environment, and moderate cues for hand placement on the controls.     Goal status: Ongoing    8. Pt. Will improve bilateral grip strength to be able to independently grasp, and pull up blankets, and linens.      Baseline: 09/26/2022: R: 0  L: 8# Pt. is attempting to grasp, and pull blankets up more, using mostly the left hand. 08/15/2022: Pt. has difficulty securely holding, and pulling up blankets, and linens.    Goal status: Ongoing   9. Pt. will consistently actively control the releasing  of blankets, covers, and linens from his hands once they are in the desired position over him. Baseline: 09/26/2022: Pt. Is improving with actively releasing blankets form his hands. 08/15/2022: Pt. has difficulty with controlled releasing of blankets/linens from his grasp.     Goal status: Ongoing    10. Pt. Will improve bilateral UE strength by 2 MM grades to assist with ADLs, and IADLs     Baseline: Right: shoulder flexion: 3+/5,  abduction: 4/5, elbow flexion: 4+/5 , extension: 4+/5 wrist extension: 2/5; Left: shoulder flexion: 4+/5, abduction: 4+/5, elbow flexion: 5/5 , extension: 4+/5 wrist extension: 3-/5   ASSESSMENT:  CLINICAL IMPRESSION:  Pt. presents with right elbow, forearm, wrist, and digit PIP, and DIP flexor tightness. Pt. reports they have continued to do more stretching. Pt. is improving with using his bilateral hands to doff his gown independently with increased time. Pt. Was able to alternate bilateral hand use to manipulate the sheet, reposition the sheet, as well as pull it up, and down. Pt. is improving with, and continues to work on pulling sheets up over him when in the recliner chair or bed. Pt. Tolerated the BUE exercises well today and was able to perform the boxing. Pt. Was able to add a 4# weight to L hand while boxing. Pt. continues to work on improving BUE functioning, ROM, strength, motor control, [facility] skills, and visual compensatory strategies to be able to independently pull up, and release blankets/covers, improve writing legibility, improve self-care including oral care, and utensil use during self-feeding tasks, and maximize independence with ADLs, and IADLs.     PERFORMANCE DEFICITS in functional skills including ADLs, IADLs, coordination, dexterity, proprioception, ROM, strength, pain, FMC, GMC, decreased knowledge of use of DME, and UE functional use, cognitive skills including safety awareness, and psychosocial skills including coping strategies, environmental adaptation, habits, and routines and behaviors.   IMPAIRMENTS are limiting patient from ADLs, IADLs, leisure, and social participation.   COMORBIDITIES may have co-morbidities  that affects occupational performance. Patient will benefit from skilled OT to address above impairments and improve overall function.  MODIFICATION OR ASSISTANCE TO COMPLETE EVALUATION: Maximum or significant modification of tasks or assist is necessary to  complete an evaluation.  OT OCCUPATIONAL PROFILE AND HISTORY: Comprehensive assessment: Review of records and  extensive additional review of physical, cognitive, psychosocial history related to current functional performance.  CLINICAL DECISION MAKING: High - multiple treatment options, significant modification of task necessary  REHAB POTENTIAL: Fair    EVALUATION COMPLEXITY: High    PLAN: OT FREQUENCY: 2 x's a week  OT DURATION:12 weeks  PLANNED INTERVENTIONS: self care/ADL training, therapeutic exercise, therapeutic activity, neuromuscular re-education, manual therapy, passive range of motion, patient/family education, and cognitive remediation/compensation  RECOMMENDED OTHER SERVICES: PT  CONSULTED AND AGREED WITH PLAN OF CARE: Patient and family member/caregiver  PLAN FOR NEXT SESSION: Initiate treatment  Herma Carson OTS,  10/19/2022, 5:18 PM  This entire session was performed under the direct supervision and direction of a licensed therapist. I have personally read, edited, and approve of the note as written.   Olegario Messier, MS, OTR/L  10/19/2022

## 2022-10-20 DIAGNOSIS — I6389 Other cerebral infarction: Secondary | ICD-10-CM | POA: Diagnosis not present

## 2022-10-20 DIAGNOSIS — L8994 Pressure ulcer of unspecified site, stage 4: Secondary | ICD-10-CM | POA: Diagnosis not present

## 2022-10-20 DIAGNOSIS — R32 Unspecified urinary incontinence: Secondary | ICD-10-CM | POA: Diagnosis not present

## 2022-10-21 NOTE — Therapy (Signed)
OUTPATIENT PHYSICAL THERAPY TREATMENT   Patient Name: Paul Hall MRN: [id number] DOB:[birth year], 27 y.o., male Today's Date: 10/21/2022  PCP:  Cindi Carbon PROVIDER:  Lenise Herald, PA-C   PT End of Session - 10/21/22 1035     Visit Number [id number]    Number of Visits 128    Date for PT Re-Evaluation 11/30/22    PT Start Time 1532    PT Stop Time 1619    PT Time Calculation (min) 47 min    Activity Tolerance Patient tolerated treatment well;No increased pain;Patient limited by fatigue    Behavior During Therapy [facility] for tasks assessed/performed             Past Medical History:  Diagnosis Date   Diabetes mellitus (HCC)    Hypertension    Stroke [facility])    Past Surgical History:  Procedure Laterality Date   IVC FILTER PLACEMENT ([facility] HX)     LEG SURGERY     PEG TUBE PLACEMENT     TRACHEOSTOMY     Patient Active Problem List   Diagnosis Date Noted   Sepsis due to vancomycin resistant Enterococcus species (HCC) 06/06/2019   SIRS (systemic inflammatory response syndrome) (HCC) 06/05/2019   Acute lower UTI 06/05/2019   VRE (vancomycin-resistant Enterococci) infection 06/05/2019   Anemia 06/05/2019   Skin ulcer of sacrum with necrosis of muscle (HCC)    Urinary retention    Type 2 diabetes mellitus without complication, with long-term current use of insulin (HCC)    Tachycardia    Lower extremity edema    Acute metabolic encephalopathy    Obstructive sleep apnea    Morbid obesity with BMI of 60.0-69.9, adult (HCC)    Goals of care, counseling/discussion    Palliative care encounter    Sepsis (HCC) 04/27/2019   H/O insulin dependent diabetes mellitus 04/27/2019   History of CVA with residual deficit 04/27/2019   Seizure disorder (HCC) 04/27/2019   Decubitus ulcer of sacral region, stage 4 (HCC) 04/27/2019    REFERRING DIAG: Cerebral infarction, unspecified   THERAPY DIAG:  Muscle weakness (generalized)  Other lack of coordination  History of CVA with  residual deficit  Abnormality of gait and mobility  Difficulty in walking, not elsewhere classified  Unsteadiness on feet  Rationale for Evaluation and Treatment Rehabilitation  PERTINENT HISTORY: Paul Hall is a 26yoM who presents with severe weakness, quadriparesis, altered sensorium, and visual impairment s/p critical illness and prolonged hospitalization. Pt hospitalized in October 2020 with ARDS 2/2 COVID19 infection. Pt sustained a complex and lengthy hospitalization which included tracheostomy, prolonged sedation, ECMO. In this period pt sustained CVA and SDH. Pt has now been liberated from tracheostomy and G-tube. Pt has since been hospitalized for wound infection and UTI. Pt lives with parents at home, has hospital bed and left chair, hoyer lift transfers, and power WC for mobility needs. Pt needs heavy physical assistance with ADL 2/2 BUE contractures and motor dysfunction   PRECAUTIONS: Fall  SUBJECTIVE:  Pt doing well today overall. No medical updates. Mom, pt ask about hoyer transfers to plinth today; author explained plan to use chair features to immulate EOB setup as well as incorporate "seated at table" setup to help with anterior trunk sitting falls anxiety. They both are agreeable to try this out.    PAIN:   Are you having pain? No   TODAY'S TREATMENT:  - 10/21/22 P/ROM tissue lengthening and joint ROM   -Bilat LLLD hip/knee/ankle flexion stretch 2x82min bilat using yoga block under  foot; WC in significant recline for reduced load in line of gravity   -WC moved forward to standard plinth in highest position, chair adjusted to achieve simulated setup as seated edge of plinth; -forward flexion to end range, including hands/arms extended to table 3x60sec (education to pt/family for similar setup at home at kitchen table)  -modified sit-up (hips/ankles at ~80 degrees, chair back reclined to 25 degrees: 1x30 (low effort per pt) *modified to include trunk ROM into  end-range flexion for active trunk extension (also x30)  -modified sit-up (hips/ankles at ~80 degrees, chair back reclined to 40 degrees for the following: situp A/ROM x10, then manually resisted trunk extension x10, situp A/ROM x10, then manually resisted trunk extension x10) consider trial of trunk extension with cable harness resistance    Pt educated throughout session about proper posture and technique with exercises. Improved exercise technique, movement at target joints, use of target muscles after min to mod verbal, visual, tactile cues.    PATIENT EDUCATION: Education details: Progression of intensity at home with trunk strength exercises and how to grade intensity using a goniometer bubble app.   HOME EXERCISE PROGRAM:  Access Code: [email or web address] URL: [email or web address] Date: 03/23/2022 Prepared by: Maureen Ralphs  Exercises - Supine Bridge  - 3 x weekly - 3 sets - 10 reps - 2 hold - Supine Gluteal Sets  - 3 x weekly - 3 sets - 10 reps - 5 sec hold - Supine Quad Set  - 1 x daily - 3 x weekly - 3 sets - 10 reps - 5 hold - Seated Long Arc Quad  - 1 x daily - 7 x weekly - 3 sets - 10 reps - Seated Hip Adduction Squeeze with Ball  - 1 x daily - 3 x weekly - 3 sets - 10 reps - 5 hold - Seated Hip Abduction  - 1 x daily - 3 x weekly - 3 sets - 10 reps - 2 hold    PT Short Term Goals -       PT SHORT TERM GOAL #1   Title Pt will be independent with HEP in order to improve strength and balance in order to decrease fall risk and improve function at home and work.    Baseline 01/04/2021= No formal HEP in place; 12/12 no HEP in place; 05/10/2021-Patient and his father were able to report compliance with curent HEP consisting of mostly seated/reclined LE strengthening. Both verbalize no questions at this time.    Time 6    Period Weeks    Status Achieved    Target Date 02/15/21            PT LONG TERM GOAL #1     Title Patient will increase BLE gross strength by  1/2 muscle grade to improve functional strength for improved independence with potential gait, increased standing tolerance and increased ADL ability.     Baseline 01/04/2021- Patient presents with 1/5 to 3-/5 B LE strength with MMT; 12/12: goal partially met for Left knee/hip; 05/10/2021= 2-/5 bilateal Hip flex; 3+/5 bilateral Knee ext; 06/21/2021= Patient presents with 2-/5 bilateral Hip flex; 3+/5 bilateral knee ext/flex; 2-/5 left ankle DF; 0/5 right ankle- and able to increase reps and resistance with LE's. 09/15/2021- Patient technically presents with 2-/5 B hip flex/abd/add - but he is able to raise his hip up to approx 100 deg which has improved. 3+/5 Bilateral knee ext, 2-/5 left ankle and 0/5 right ankle.  12/08/2021= Patient able to lift left knee at  110 deg of hip flex; presents with 3+/5 knee ext, 2-/5 left ankle DF and 0/5 right ankle DF, 2-/5 bilateral Hip abd in seated position.    12/6: R: knee 3+/5 ext, 2/5 flexion, left knee 3+/5 extension, 3+/5 flexion, R hip: 2+/5 hip add, 2+/5 hip ABD L hip: 4-/5 hip ABD, 3+/5 hip ADD, 3+/5 hip flexion; 06/06/2022= Patient now presents with 2-/5 right ankle DF/PF;     Time 12     Period Weeks     Status MET    Target Date 03/07/2022         PT LONG TERM GOAL #2    Title Patient will tolerate sitting unsupported demonstrating erect sitting posture for 15 minutes with CGA to demonstrate improved back extensor strength and improved sitting tolerance.     Baseline 01/04/2021- Patient confied to sitting in lift chair or electric power chair with back support and unable to sit upright without physical assistance; 12/12: tolerates <1 minutes upright unsupported sitting. 05/10/2021=static sit with forward trunk lean  in his power wheelchair without back support x approx 3 min. 06/21/2021=Unable to assess today due to patient with acute back pain but on previous visit able to sit x 8 min without back support. 09/15/2021- on last visit- 09/13/2021- patient was able to sit  unsupported x 8 min at edge of mat. 10/13/2023 - Patient was able to sit at edge of mat with varying level of assist today from SBA to min A for a total of 20 min. 12/13/2021= Patient demonstrated unsupported sitting at edge of mat for approx 20 min    Time 12     Period Weeks     Status GOAL MET    Target Date 12/08/21          PT LONG TERM GOAL #3    Title Patient will demonstrate ability to perform static standing in // bars > 2 min with Max Assist  without loss of balance and fair posture for improved overall strength for pre-gait and transfer activities.     Baseline 01/04/2021= Patient current uanble to stand- Dependent on hoyer or sit to stand lift for transfers. 05/10/2021=Not appropriate yet- Currently still dependent with all transfers using hoyer. 06/21/2021= Patient continuing now to focus on LE strengthening to prepare for standing-unable to try today due to acute low back pain-  planning on attempting in new cert period. 09/15/2021- Patient has attempted standing 2x in past two week- max Assist of 2 people - only once was he successful to clearing his bottom from chair - Will continue to be a focus during the new certification. 12/13/2021= Patient has been limited secondary to increased overall low back pain during this certification and will require more time to focus on this goal.  12/6: not assessed this date, will assess at date when 2-3 PTs are present for assistance     Time 12     Period Weeks     Status GOAL not appropriate at this time - may attempt in future once Patient presents with improved overall LE strength.                 PT LONG TERM GOAL #4    Title Pt will improve FOTO score by 10 points or more demonstrating improved perceived functional ability     Baseline FOTO 7 on 10/17; 03/15/21: FOTO 12; 05/10/2021 06/21/2021= 1; 09/15/2021= 9; 12/13/2021= Will issue next visit 12/6: 4; 06/06/2022= Will assess next visit    Time 12  Period Weeks     Status ONGOING    Target date  11/30/2022         PT LONG TERM GOAL #5    Title Patient will perform sit to stand transfer with appropriate AD and max assist of 2 people with 75% consistency to prepare for pregait activities.     Baseline 09/15/2021= Patient unable to stand well- unable to clear his bottom off chair with Max assist of 2 persons. 12/13/2021- Goal not appropriate to try yet but will keep and roll over to next cert as shift continues to focus on transfers/standing; 4/24= Patient able to perform active ankle DF/PF with right LE and able to raise his knee into seated march and clear floor without physical assist today - as previously unable as well as lift right knee ext to near full ROM to improve strength for eventual standing. 09/07/2022- Goal continues to not be appropriate but patient is now standing some and will keep goal active    Time 12     Period Weeks     Status Goal still not appropriate for now but will keep active for future    Target date         PT LONG TERM GOAL #6  Title Patient will tolerate sitting unsupported demonstrating erect sitting posture for 30 minutes with CGA to demonstrate improved back extensor strength and improved sitting tolerance.   Baseline 12/13/2021= Patient demonstrated unsupported sitting at edge of mat for approx 20 min;  12/29/2021- Patient performed approx 30 min of dynamic sitting activities today. 06/06/2022= Patient demonstrated ability to sit and perform static and dynamic UE/LE movement with only Supervision.   Time 12   Period Weeks   Status GOAL MET           7.  Patient will tolerate 5 minutes or more of standing in sit to stand lift or with max assist +2 in order to indicate improved lower extremity weightbearing tolerance for progression to standing in parallel bars. Baseline: 1 minute on most recent stand 02/21/22; 06/06/2022- Patient did attempt today after complaining of right LE pain last week. He attempted 3 stands using sit to stand lift. - all over 1 min- last one  approx 48 sec- stopped due to fatigue. More erect standing in lift today - still poor gluteal strength but able to activate glutes and extend hips upon command but unable to hold > 5 sec. 07/28/2022- Will attempt standing next visit but patient has been able to stand since last progress note for up to 3 min at a time at his best. 09/07/2022-  attempted standing with max +2 and patient able to stand 15-20 sec so will now  revise goal Goal status: GOAL Revised/ongoing Target date: 11/30/2022   8. Patient will tolerate sitting unsupported demonstrating ability to perform dynamic UE/LE activities for 30 minutes  independently to demonstrate ability to sit at edge of bed to eat or perform some ADL's/exercise for optimal quality of life.             Baseline: 06/06/2022- Patient able to sit and perform some UE/LE exercises but requires CGA at times for safety- he is able to static sit for 30 min with supervision. 07/27/2022= Patient able to now sit > 30 min with Supervision only - performing static and dynamic activities. Will keep goal active to ensure patient is consistent. 09/07/2022=  Patient able to demonstrate consistent ability to sit at edge of mat during visits  Goal Status: MET             Target date: 08/29/2022        Plan     Clinical Impression Statement As a result of a little more time and problem solving modification in session to decrease falls anxiety with forward trunk flexion, able to progress both repetitions and intensity. Postural features of power chair used to improve author's body position and time during interventions. Pt will continue to benefit from skilled physical therapy intervention to address impairments, improve QOL, and attain therapy goals.    Personal Factors and Comorbidities Comorbidity 3+;Time since onset of injury/illness/exacerbation    Comorbidities CVA, diabetes, Seizures    Examination-Activity Limitations Bathing;Bed Mobility;Bend;Caring for  Others;Carry;Dressing;Hygiene/Grooming;Lift;Locomotion Level;Reach Overhead;Self Feeding;Sit;Squat;Stairs;Stand;Transfers;Toileting    Examination-Participation Restrictions Cleaning;Community Activity;Driving;Laundry;Medication Management;Meal Prep;Occupation;Personal Finances;Shop;Yard Work;Volunteer    Stability/Clinical Decision Making Evolving/Moderate complexity    Rehab Potential Fair    PT Frequency 2x / week    PT Duration 12 weeks    PT Treatment/Interventions ADLs/Self Care Home Management;Cryotherapy;Electrical Stimulation;Moist Heat;Ultrasound;DME Instruction;Gait training;Stair training;Functional mobility training;Therapeutic exercise;Balance training;Patient/family education;Orthotic Fit/Training;Neuromuscular re-education;Wheelchair mobility training;Manual techniques;Passive range of motion;Dry needling;Energy conservation;Taping;Visual/perceptual remediation/compensation;Joint Manipulations    PT Next Visit Plan core strength- sitting at EOM, postural control, sit to stand if able; Continue with progressive LE Strengthening. Stand activities as appropriate.     PT Home Exercise Plan No changes to HEP today    Consulted and Agree with Plan of Care Patient;Family member/caregiver    Family Member Consulted Mom             10:39 AM, 10/21/22 Rosamaria Lints, PT, DPT Physical Therapist - [facility]  Outpatient Physical Therapy- [facility] [phone]     10/21/2022, 10:39 AM

## 2022-10-24 ENCOUNTER — Ambulatory Visit: Payer: BC Managed Care – PPO

## 2022-10-24 ENCOUNTER — Ambulatory Visit: Payer: BC Managed Care – PPO | Admitting: Occupational Therapy

## 2022-10-24 DIAGNOSIS — R278 Other lack of coordination: Secondary | ICD-10-CM | POA: Diagnosis not present

## 2022-10-24 DIAGNOSIS — R262 Difficulty in walking, not elsewhere classified: Secondary | ICD-10-CM

## 2022-10-24 DIAGNOSIS — M6281 Muscle weakness (generalized): Secondary | ICD-10-CM

## 2022-10-24 DIAGNOSIS — I6930 Unspecified sequelae of cerebral infarction: Secondary | ICD-10-CM | POA: Diagnosis not present

## 2022-10-24 DIAGNOSIS — R269 Unspecified abnormalities of gait and mobility: Secondary | ICD-10-CM | POA: Diagnosis not present

## 2022-10-24 DIAGNOSIS — R2681 Unsteadiness on feet: Secondary | ICD-10-CM

## 2022-10-24 NOTE — Therapy (Signed)
OUTPATIENT PHYSICAL THERAPY TREATMENT   Patient Name: Paul Hall MRN: [id number] DOB:[birth year], 27 y.o., male Today's Date: 10/25/2022  PCP:  Cindi Carbon PROVIDER:  Lenise Herald, PA-C   PT End of Session - 10/24/22 1539     Visit Number 113    Number of Visits 128    Date for PT Re-Evaluation 11/30/22    Progress Note Due on Visit 120    PT Start Time 1539    PT Stop Time 1616    PT Time Calculation (min) 37 min    Activity Tolerance Patient tolerated treatment well;No increased pain;Patient limited by fatigue    Behavior During Therapy [facility] for tasks assessed/performed              Past Medical History:  Diagnosis Date   Diabetes mellitus (HCC)    Hypertension    Stroke [facility])    Past Surgical History:  Procedure Laterality Date   IVC FILTER PLACEMENT ([facility] HX)     LEG SURGERY     PEG TUBE PLACEMENT     TRACHEOSTOMY     Patient Active Problem List   Diagnosis Date Noted   Sepsis due to vancomycin resistant Enterococcus species (HCC) 06/06/2019   SIRS (systemic inflammatory response syndrome) (HCC) 06/05/2019   Acute lower UTI 06/05/2019   VRE (vancomycin-resistant Enterococci) infection 06/05/2019   Anemia 06/05/2019   Skin ulcer of sacrum with necrosis of muscle (HCC)    Urinary retention    Type 2 diabetes mellitus without complication, with long-term current use of insulin (HCC)    Tachycardia    Lower extremity edema    Acute metabolic encephalopathy    Obstructive sleep apnea    Morbid obesity with BMI of 60.0-69.9, adult (HCC)    Goals of care, counseling/discussion    Palliative care encounter    Sepsis (HCC) 04/27/2019   H/O insulin dependent diabetes mellitus 04/27/2019   History of CVA with residual deficit 04/27/2019   Seizure disorder (HCC) 04/27/2019   Decubitus ulcer of sacral region, stage 4 (HCC) 04/27/2019    REFERRING DIAG: Cerebral infarction, unspecified   THERAPY DIAG:  Muscle weakness (generalized)  Other lack of  coordination  History of CVA with residual deficit  Abnormality of gait and mobility  Difficulty in walking, not elsewhere classified  Unsteadiness on feet  Rationale for Evaluation and Treatment Rehabilitation  PERTINENT HISTORY: Paul Hall is a 26yoM who presents with severe weakness, quadriparesis, altered sensorium, and visual impairment s/p critical illness and prolonged hospitalization. Pt hospitalized in October 2020 with ARDS 2/2 COVID19 infection. Pt sustained a complex and lengthy hospitalization which included tracheostomy, prolonged sedation, ECMO. In this period pt sustained CVA and SDH. Pt has now been liberated from tracheostomy and G-tube. Pt has since been hospitalized for wound infection and UTI. Pt lives with parents at home, has hospital bed and left chair, hoyer lift transfers, and power WC for mobility needs. Pt needs heavy physical assistance with ADL 2/2 BUE contractures and motor dysfunction   PRECAUTIONS: Fall  SUBJECTIVE:  Patient reports feeling pretty good and gaining confidence with his sitting ability and forward lean.    PAIN:   Are you having pain? No   TODAY'S TREATMENT:  - 10/24/2022  THERAPEUTIC ACTIVITIES:   *Dependent hoyer transfer with assist of PT/Patient mother from power w/c to middle of large mat table    Ant/post trunk leans-each direction to simulate "nose over toes approach for future transfers." X 20 reps   Cross body- reaching  with BUE- (some limitations with RUE due to tone) - 20 reps each Direction.   Boxing - 2 jabs +cross (performed 20 reps each UE)        Lateral trunk leans- placing hands on mat attempting some UE weightbearing x 20 reps each.    *at end of session- *Dependent hoyer transfer with assist of PT/Patient mother from mat table back to power w/c    THERAPEUTIC EXERCISE:  (while sitting edge of  mat)  Knee ext AROM 2 x 10  reps each LE   Hip march- AROM x 10 reps         Pt educated throughout session  about proper posture and technique with exercises. Improved exercise technique, movement at target joints, use of target muscles after min to mod verbal, visual, tactile cues.    PATIENT EDUCATION: Education details: Progression of intensity at home with trunk strength exercises and how to grade intensity using a goniometer bubble app.   HOME EXERCISE PROGRAM:  Access Code: [email or web address] URL: [email or web address] Date: 03/23/2022 Prepared by: Maureen Ralphs  Exercises - Supine Bridge  - 3 x weekly - 3 sets - 10 reps - 2 hold - Supine Gluteal Sets  - 3 x weekly - 3 sets - 10 reps - 5 sec hold - Supine Quad Set  - 1 x daily - 3 x weekly - 3 sets - 10 reps - 5 hold - Seated Long Arc Quad  - 1 x daily - 7 x weekly - 3 sets - 10 reps - Seated Hip Adduction Squeeze with Ball  - 1 x daily - 3 x weekly - 3 sets - 10 reps - 5 hold - Seated Hip Abduction  - 1 x daily - 3 x weekly - 3 sets - 10 reps - 2 hold    PT Short Term Goals -       PT SHORT TERM GOAL #1   Title Pt will be independent with HEP in order to improve strength and balance in order to decrease fall risk and improve function at home and work.    Baseline 01/04/2021= No formal HEP in place; 12/12 no HEP in place; 05/10/2021-Patient and his father were able to report compliance with curent HEP consisting of mostly seated/reclined LE strengthening. Both verbalize no questions at this time.    Time 6    Period Weeks    Status Achieved    Target Date 02/15/21            PT LONG TERM GOAL #1     Title Patient will increase BLE gross strength by 1/2 muscle grade to improve functional strength for improved independence with potential gait, increased standing tolerance and increased ADL ability.     Baseline 01/04/2021- Patient presents with 1/5 to 3-/5 B LE strength with MMT; 12/12: goal partially met for Left knee/hip; 05/10/2021= 2-/5 bilateal Hip flex; 3+/5 bilateral Knee ext; 06/21/2021= Patient presents with 2-/5  bilateral Hip flex; 3+/5 bilateral knee ext/flex; 2-/5 left ankle DF; 0/5 right ankle- and able to increase reps and resistance with LE's. 09/15/2021- Patient technically presents with 2-/5 B hip flex/abd/add - but he is able to raise his hip up to approx 100 deg which has improved. 3+/5 Bilateral knee ext, 2-/5 left ankle and 0/5 right ankle.  12/08/2021= Patient able to lift left knee at 110 deg of hip flex; presents with 3+/5 knee ext, 2-/5 left ankle DF and 0/5 right ankle DF, 2-/5 bilateral Hip abd in seated  position.    12/6: R: knee 3+/5 ext, 2/5 flexion, left knee 3+/5 extension, 3+/5 flexion, R hip: 2+/5 hip add, 2+/5 hip ABD L hip: 4-/5 hip ABD, 3+/5 hip ADD, 3+/5 hip flexion; 06/06/2022= Patient now presents with 2-/5 right ankle DF/PF;     Time 12     Period Weeks     Status MET    Target Date 03/07/2022         PT LONG TERM GOAL #2    Title Patient will tolerate sitting unsupported demonstrating erect sitting posture for 15 minutes with CGA to demonstrate improved back extensor strength and improved sitting tolerance.     Baseline 01/04/2021- Patient confied to sitting in lift chair or electric power chair with back support and unable to sit upright without physical assistance; 12/12: tolerates <1 minutes upright unsupported sitting. 05/10/2021=static sit with forward trunk lean  in his power wheelchair without back support x approx 3 min. 06/21/2021=Unable to assess today due to patient with acute back pain but on previous visit able to sit x 8 min without back support. 09/15/2021- on last visit- 09/13/2021- patient was able to sit unsupported x 8 min at edge of mat. 10/13/2023 - Patient was able to sit at edge of mat with varying level of assist today from SBA to min A for a total of 20 min. 12/13/2021= Patient demonstrated unsupported sitting at edge of mat for approx 20 min    Time 12     Period Weeks     Status GOAL MET    Target Date 12/08/21          PT LONG TERM GOAL #3    Title Patient will  demonstrate ability to perform static standing in // bars > 2 min with Max Assist  without loss of balance and fair posture for improved overall strength for pre-gait and transfer activities.     Baseline 01/04/2021= Patient current uanble to stand- Dependent on hoyer or sit to stand lift for transfers. 05/10/2021=Not appropriate yet- Currently still dependent with all transfers using hoyer. 06/21/2021= Patient continuing now to focus on LE strengthening to prepare for standing-unable to try today due to acute low back pain-  planning on attempting in new cert period. 09/15/2021- Patient has attempted standing 2x in past two week- max Assist of 2 people - only once was he successful to clearing his bottom from chair - Will continue to be a focus during the new certification. 12/13/2021= Patient has been limited secondary to increased overall low back pain during this certification and will require more time to focus on this goal.  12/6: not assessed this date, will assess at date when 2-3 PTs are present for assistance     Time 12     Period Weeks     Status GOAL not appropriate at this time - may attempt in future once Patient presents with improved overall LE strength.                 PT LONG TERM GOAL #4    Title Pt will improve FOTO score by 10 points or more demonstrating improved perceived functional ability     Baseline FOTO 7 on 10/17; 03/15/21: FOTO 12; 05/10/2021 06/21/2021= 1; 09/15/2021= 9; 12/13/2021= Will issue next visit 12/6: 4; 06/06/2022= Will assess next visit    Time 12     Period Weeks     Status ONGOING    Target date 11/30/2022  PT LONG TERM GOAL #5    Title Patient will perform sit to stand transfer with appropriate AD and max assist of 2 people with 75% consistency to prepare for pregait activities.     Baseline 09/15/2021= Patient unable to stand well- unable to clear his bottom off chair with Max assist of 2 persons. 12/13/2021- Goal not appropriate to try yet but will keep and  roll over to next cert as shift continues to focus on transfers/standing; 4/24= Patient able to perform active ankle DF/PF with right LE and able to raise his knee into seated march and clear floor without physical assist today - as previously unable as well as lift right knee ext to near full ROM to improve strength for eventual standing. 09/07/2022- Goal continues to not be appropriate but patient is now standing some and will keep goal active    Time 12     Period Weeks     Status Goal still not appropriate for now but will keep active for future    Target date         PT LONG TERM GOAL #6  Title Patient will tolerate sitting unsupported demonstrating erect sitting posture for 30 minutes with CGA to demonstrate improved back extensor strength and improved sitting tolerance.   Baseline 12/13/2021= Patient demonstrated unsupported sitting at edge of mat for approx 20 min;  12/29/2021- Patient performed approx 30 min of dynamic sitting activities today. 06/06/2022= Patient demonstrated ability to sit and perform static and dynamic UE/LE movement with only Supervision.   Time 12   Period Weeks   Status GOAL MET           7.  Patient will tolerate 5 minutes or more of standing in sit to stand lift or with max assist +2 in order to indicate improved lower extremity weightbearing tolerance for progression to standing in parallel bars. Baseline: 1 minute on most recent stand 02/21/22; 06/06/2022- Patient did attempt today after complaining of right LE pain last week. He attempted 3 stands using sit to stand lift. - all over 1 min- last one approx 48 sec- stopped due to fatigue. More erect standing in lift today - still poor gluteal strength but able to activate glutes and extend hips upon command but unable to hold > 5 sec. 07/28/2022- Will attempt standing next visit but patient has been able to stand since last progress note for up to 3 min at a time at his best. 09/07/2022-  attempted standing with max +2  and patient able to stand 15-20 sec so will now  revise goal Goal status: GOAL Revised/ongoing Target date: 11/30/2022   8. Patient will tolerate sitting unsupported demonstrating ability to perform dynamic UE/LE activities for 30 minutes  independently to demonstrate ability to sit at edge of bed to eat or perform some ADL's/exercise for optimal quality of life.             Baseline: 06/06/2022- Patient able to sit and perform some UE/LE exercises but requires CGA at times for safety- he is able to static sit for 30 min with supervision. 07/27/2022= Patient able to now sit > 30 min with Supervision only - performing static and dynamic activities. Will keep goal active to ensure patient is consistent. 09/07/2022=  Patient able to demonstrate consistent ability to sit at edge of mat during visits             Goal Status: MET  Target date: 08/29/2022        Plan     Clinical Impression Statement Treatment continued to focus on sitting weight shifting to prepare for future transfers and patient presents with much improved confidence in leaning forward and performed well with tracking PT hand for dynamic UE activities with no LOB in sitting -including leaning forward. Pt will continue to benefit from skilled physical therapy intervention to address impairments, improve QOL, and attain therapy goals.    Personal Factors and Comorbidities Comorbidity 3+;Time since onset of injury/illness/exacerbation    Comorbidities CVA, diabetes, Seizures    Examination-Activity Limitations Bathing;Bed Mobility;Bend;Caring for Others;Carry;Dressing;Hygiene/Grooming;Lift;Locomotion Level;Reach Overhead;Self Feeding;Sit;Squat;Stairs;Stand;Transfers;Toileting    Examination-Participation Restrictions Cleaning;Community Activity;Driving;Laundry;Medication Management;Meal Prep;Occupation;Personal Finances;Shop;Yard Work;Volunteer    Stability/Clinical Decision Making Evolving/Moderate complexity    Rehab Potential  Fair    PT Frequency 2x / week    PT Duration 12 weeks    PT Treatment/Interventions ADLs/Self Care Home Management;Cryotherapy;Electrical Stimulation;Moist Heat;Ultrasound;DME Instruction;Gait training;Stair training;Functional mobility training;Therapeutic exercise;Balance training;Patient/family education;Orthotic Fit/Training;Neuromuscular re-education;Wheelchair mobility training;Manual techniques;Passive range of motion;Dry needling;Energy conservation;Taping;Visual/perceptual remediation/compensation;Joint Manipulations    PT Next Visit Plan core strength- sitting at EOM, postural control, sit to stand if able; Continue with progressive LE Strengthening. Stand activities as appropriate.     PT Home Exercise Plan No changes to HEP today    Consulted and Agree with Plan of Care Patient;Family member/caregiver    Family Member Consulted Mom             9:29 AM, 10/25/22 Louis Meckel, PT Physical Therapist - [facility]  Outpatient Physical Therapy-  [facility] [phone]     10/25/2022, 9:29 AM

## 2022-10-25 NOTE — Therapy (Addendum)
OCCUPATIONAL THERAPY NEURO TREATMENT NOTE  Patient Name: Paul Hall MRN: [id number] DOB:1995-12-07, 27 y.o., male Today's Date: 10/25/2022  PCP: Dr. Sherwood Gambler REFERRING PROVIDER: Dr. Sherwood Gambler   OT End of Session - 10/25/22 0802     Visit Number 45    Number of Visits 60    Date for OT Re-Evaluation 12/19/22    OT Start Time 1625    OT Stop Time 1700    OT Time Calculation (min) 35 min    Equipment Utilized During Treatment tilt in space power wc    Activity Tolerance Patient tolerated treatment well    Behavior During Therapy WFL for tasks assessed/performed                     Past Medical History:  Diagnosis Date   Diabetes mellitus (HCC)    Hypertension    Stroke [facility])    Past Surgical History:  Procedure Laterality Date   IVC FILTER PLACEMENT ([facility] HX)     LEG SURGERY     PEG TUBE PLACEMENT     TRACHEOSTOMY     Patient Active Problem List   Diagnosis Date Noted   Sepsis due to vancomycin resistant Enterococcus species (HCC) 06/06/2019   SIRS (systemic inflammatory response syndrome) (HCC) 06/05/2019   Acute lower UTI 06/05/2019   VRE (vancomycin-resistant Enterococci) infection 06/05/2019   Anemia 06/05/2019   Skin ulcer of sacrum with necrosis of muscle (HCC)    Urinary retention    Type 2 diabetes mellitus without complication, with long-term current use of insulin (HCC)    Tachycardia    Lower extremity edema    Acute metabolic encephalopathy    Obstructive sleep apnea    Morbid obesity with BMI of 60.0-69.9, adult (HCC)    Goals of care, counseling/discussion    Palliative care encounter    Sepsis (HCC) 04/27/2019   H/O insulin dependent diabetes mellitus 04/27/2019   History of CVA with residual deficit 04/27/2019   Seizure disorder (HCC) 04/27/2019   Decubitus ulcer of sacral region, stage 4 (HCC) 04/27/2019    ONSET DATE: 01/2019  REFERRING DIAG: CVA/COVID-19  THERAPY DIAG:  Muscle weakness (generalized)  Other lack of  coordination  Rationale for Evaluation and Treatment Rehabilitation  SUBJECTIVE:   SUBJECTIVE STATEMENT:  Pt. Reports he is doing well today and that he is looking forward to going to a wrestling event on August 10th with his family.  Pt accompanied by: self and family member  PERTINENT HISTORY:  Pt. is a 27 y.o. male who was diagnosed with COVID-19, and CVA with resultant quadriplegia. Pt. Was then hospitalized with VRE UTI. PMHx includes: urinary retention, seizure disorder, obstructive sleep disorder, DM Type II, Morbid obesity.   PRECAUTIONS: None  WEIGHT BEARING RESTRICTIONS No  PAIN:  Are you having pain? No pain reported  FALLS: Has patient fallen in last 6 months? No  LIVING ENVIRONMENT: Lives with: lives with their family Lives in: House/apartment Stairs: No Level Entry Has following equipment at home: Wheelchair (power) and hoyer lift, sit to stand lift  PLOF: Independent  PATIENT GOALS: To be able to engage in more daily care tasks.  OBJECTIVE:   HAND DOMINANCE: Right  ADLs:  Transfers/ambulation related to ADLs: Eating: Pt. reports being able to hold standard utensils, and is starting to engage more in self-feeding tasks, hand to mouth patterns. Pt. Reports that he does as much as he can with the task, and family assists  with the remainder of the  task. Grooming: Pt. Is able to initiate holding an electric toothbrush, and brush his teeth. Family assists LB Dressing: Total Assist UE dressing: Pt. is now able to reach up to actively assist with grasping , and pulling his gown down. Toileting:  Total Assist Bathing: MaxA UB, Total assist LB Tub Shower transfers: N/A Equipment: See above    IADLs: Shopping: Relies on family to assist Light housekeeping: Total Assist Meal Prep: Total Assist Community mobility:   Medication management:  Total Assist  Financial management: N/A Handwriting: Not legible: Pt. Is able to hold a pen with the left hand, and  initiate marking the page. Pt.'s eye glasses were not available  MOBILITY STATUS:  Power w/c  POSTURE COMMENTS:  Pt. Requires position changes in his power w/c  ACTIVITY TOLERANCE: Activity tolerance:  Fair  FUNCTIONAL OUTCOME MEASURES: FOTO: 40  TR score: 45  05/25/2022:   FOTO: 50 TR score: 45  07/20/22: FOTO 55  UPPER EXTREMITY ROM     Active ROM Right eval Right 03/21/2022 Right 05/25/2022 R  07/20/22 Right  07/25/22 Right 08/15/2022 Right 09/26/2022 Left eval Left  03/21/2022 Left 05/25/2022 L  07/20/22 Left 07/25/2022 Left  08/15/2022 Left  09/26/2022  Shoulder flexion 106 scaption 105  Scaption 62 flexion 110 scaption 100  105 105 119 123 125 130  136 138  Shoulder abduction 114 90 97 100  105 117 110 115 115 115  118 121  Shoulder adduction                Shoulder extension                Shoulder internal rotation                Shoulder external rotation                Elbow flexion 120(130) 145 145 140  144 140 135(135) 145 145   145   Elbow extension -45(-35) -22(-35) -28(-22)  -32(-21) -32(-28) -28(-26) -27(-18) -21(-20) -20(-16)  -25(-10) -23(-10) -21(-10)  Wrist flexion                Wrist extension -30(10) -25(10) -10(30)  -10(20) -10(25) -20(40) 10(50) 19(50) 28(50)  30(50) 35(55) 36(55)  Wrist ulnar deviation                Wrist radial deviation                Wrist pronation                Wrist supination   Limited by flexor tone       Limited by flexor tone      (Blank rows = not tested)      UPPER EXTREMITY MMT:     Right eval Right 03/21/2022 Right  05/25/2022 R 07/20/22 Right 08/25/2022 Right 09/26/2022 Left eval Left 03/21/2022 Left  05/25/2022 L 07/20/22 Left  08/15/2022 Left 09/26/2022  Shoulder flexion 3-/5 scaption 3-/5 3-/5 3+/5 3+/5 3+/5 3-/5 3/5 3+/5 4+/5 4+/5 4+/5  Shoulder abduction 3-/5 3-/5 3-/5 4/5 4/5 4/5 3-/5 3-/5 4-/5 4+/5 4+/5 4+5  Shoulder adduction              Shoulder extension              Shoulder internal rotation               Shoulder external rotation  Elbow flexion 3/5 3/5 3+/5 4/5 4+/5 4+/5 3/5 3+/5 4-/5 4+/5 5/5 5/5  Elbow extension 2-/5 2/5 3-/5 4+/5 4+/5 4+/5 2-/5 2/5  4+/5 4+/5 4+/5  Wrist flexion              Wrist extension 2-/5 2-/5 2/5 2/5 2/5 2/5 2/5 2+/5 3-/5  3-/5 3-/5  Wrist ulnar deviation              Wrist radial deviation              Wrist pronation              Wrist supination              (Blank rows = not tested)     HAND FUNCTION:  Grip strength: Right: 0 lbs; Left: 0 lbs and Lateral pinch: Right: 5 lbs, Left: 2 lbs  03/21/2022: Lateral pinch: Right: 3.5 lbs, Left: 2 lbs  07/20/22: Grip strength: Right: 3 lbs; Left: 4 lbs and Lateral pinch: Right: 5 lbs, Left: 3 lbs  08/15/2022:  Grip strength: Right: 0 lbs; Left: 5 lbs and Lateral pinch: Right: 2 lbs, Left: 4 lbs  09/26/2022  Grip strength: Right: 0 lbs; Left: 8 lbs and Lateral pinch: Right: 2 lbs, Left: 5 lbs   Bilateral digit PIP/DIP flexion contractures with MP hyperextension with attempts for AROM. Pt. is able to tolerate AROM to the bilateral digits at the initial evaluation however, has a history of pain in the digits.  COORDINATION:  Eval: Pt. is unable to grasp 9-hole test pegs. Pt. is able to initiate grasping larger pegs, and is able to hold a pen in the left hand.  07/20/22: 2 min 36 seconds to remove 9 pegs from 9 hole peg test - cues to locate pegs 2/2 low vision. Pt. is able to initiate grasping larger pegs on R hand and is able to hold a pen in the left hand.  08/15/2022:      Vision, and sensation limiting accuracy of 9 hole peg test results. Pt. Was able to grasp and remove vertical pegs intermittently with cues.    SENSATION: Light touch: Impaired   EDEMA:  N/A  MUSCLE TONE: BUE flexor Spasticity  COGNITION Overall cognitive status: Continue to assess in functional context  VISION:   Subjective report: Pt. was not wearing glasses at the time of the initial eval.   Baseline vision: Vision is very limited. Wears glasses all the time Visual history: History of impaired vision following CVA. Pt. Has received treatment through the [facility] low vision rehabilitation program.   VISION ASSESSMENT: Impaired To be further assessed in functional context  PERCEPTION: Impaired   PRAXIS: Impaired: motor planning  OBSERVATIONS:  Pt reports being on Tramadol   TODAY'S TREATMENT    Therapeutic EX.:  Pt. worked on [employer] with PROM stretching to the end range for bilateral shoulder flexion, abduction, elbow flexion, extension, prolonged stretching for bilateral forearm supination, wrist extension, digit MP, PIP, and DIP extension, thumb radial abduction. Pt. completed seated BUE therex using boxing speed bag set at head height; intermittent rest breaks 4# wrist weight to L arm.   Self-care:   Pt. worked on bilateral hand [facility] skills grasping sheets while alternating bilateral hands to intentionally pull it up over him. Pt. worked on removing the sheet/pulling it down using bilateral hand [facility] skills to mimic taking his gown off.     PATIENT EDUCATION: Education details: There. Halford Decamp Person educated: Patient and Parent Education  method: Explanation, Demonstration, Tactile cues, and Verbal cues Education comprehension: verbalized understanding, returned demonstration, verbal cues required, and needs further education   HOME EXERCISE PROGRAM:   Continue ongoing assessment, and continue to provide as needed.     GOALS: Goals reviewed with patient? Yes  SHORT TERM GOALS: Target date: 11/07/2022         To assess splint fit, and make appropriate adjustments to promote good skin integrity through the palmar surface of the bilateral hands.  Baseline: 05/25/22: Goal currently met, however ongoing as needs to assess splint fit arise. 03/23/2022: Pt. is wearing splints a couple of hours at night bilateral resting hand splints. 03/21/2022: Pt. is wearing  splints a couple of hours at night bilateral resting hand splints. Goal status: Continue ongoing assessment as requested, and as needed   LONG TERM GOALS: Target date: 12/19/2022      FOTO score will Improve by 2 points for Pt. perceived improvement with the assessment specific ADL/IADL tasks.  Baseline: 09/27/2022: 51 07/20/2022: FOTO 55 05/25/2022: FOTO score: 50,  TR score: 45 Eval: FOTO score: 40,  TR score: 45 Goal status: Achieved   2.   Pt. will independently perform oral care for 100% of the task after complete set-up. Baseline: 09/26/2022: Completes 90% of the task, proximal assist at times required at the elbow.  08/15/2022: Completes 90% of the task, proximal assist at times required at the elbow. 07/20/22: completes 90% of task, limited by shoulder flexion. 05/25/2022:  Pt. Is able to initiate and perform oral care for approximately 90% of the task. Complete set-up required. Assi needed only for the very back teeth. 03/23/2022: Pt. Is able to initiate and perform oral care for approximately 75% of the task. Pt. Requires assist at proximally at the elbow for through oral care. 03/21/2022: Pt. Is able to initiate and perform oral care for approximately 75% of the task. Pt. Requires assist at proximally at the elbow for through oral care. Eval: Pt. is able to initiate using an electric toothbrush. Pt. requires assist for set-up, and assist for thoroughness, and as he Pt. fatigues. Goal status: Ongoing  3.  Pt. Will be independent with self-feeding for 100% of the meal after complete set-up Baseline:09/26/2022: 90% using the spoon, assit at time with the forearm motion, and grip on the utensils. 100% for finger foods.  08/15/2022:  90% using the spoon, assit at time with the forearm motion, and grip on the utensils. 100% for finger foods-independent with set-up- unsupervised.  07/20/22: self-feeds cereal using spoon 90% of task. 05/25/2022: Pt. Is able to use a spoon to scoop cereal when feeding himself  cereal 85% of the time. Pt. Is able to feed himself snack/finger foods 100% of the time. Pt. Continues to work on consistency of  stabilizing a cup/mug when drinking. Pt. Is able to grasp a water bottle with assist initially, with assist tapering off as he drinks.03/23/2022: Pt. Is able to perform scooping cereal for 75% of the time. Pt. required assist, and support at the left elbow, and Pt. Presents with limited forearm supination when using the spoon, and bringing it towards his mouth. Pt. Is able to use a fork to spear items, and perform the hand to mouth pattern.  03/21/2022: Pt. Is able to perform scooping cereal for 75% of the time. Pt. required assist, and support at the left elbow, and Pt. presents with limited forearm supination when using the spoon, and bringing it towards his mouth. Pt. Is able to use  a fork to spear items, and perform the hand to mouth pattern.  Eval: Pt. is able to hold standard standard utensils. Pt. Performs as much of the task as he, can and has assistance for the remainder. Goal status: Ongoing  4.  Pt. Will improve grasp patterns and consistently grasp 1/4" objects for ADL, and IADL tasks.  Baseline: 09/26/2022: Continues to grasp 1/4" pegs with min a + visual cues, consistently grasping 1/2" objects with visual cues. 08/15/2022: grasps 1/4" pegs with min a + visual cues, consistently grasping 1/2" objects with visual cues. 07/20/22: grasps 1/4" pegs with min a + visual cues, consistently grasping 1/2" objects with visual cues. 05/25/2022: Pt. Is working on improving consistency of grasping 1/2" objects with visual cues.  03/23/2022: Pt. Is able to grasp 1" objects consistently,and continues to work on the hand patterns needed to grasp 1/2" objects.03/21/2022: Pt. Is able to grasp 1" objects consistently,and continues to work on the hand patterns needed to grasp 1/2" objects. Eval: Pt. is able to grasp 1" objects intermittently using a lateral grasp pattern. Goal status:  Progressing; goal revised to 1/4" items  5.  Pt. will independently write his name legibly with letter sizes under 1". Baseline: 09/26/2022: Pt. Continues to be able to write name with smaller letter size. Pt. Is signing name with a computer styus. Pt. Requires visual cues, and assist. 08/15/2022: Pt. Is able to write name with smaller letter size. Pt. Is signing name with a computer styus. Pt. Requires visual cues, and assist. 07/20/22: stabilizing assist to write name with 75% legibility with 2" letters. 05/25/2022: Pt. to continue to work towards formulating grasp patterns in preparation for grasping a large width pen. Pt. Requires visual cues. 03/23/2022: Pt. Is able to write his name with modA, however has difficulty with formulating letter sizes less than 2" in size with 50% legibility for the 3 letters of his name.03/21/2022: Pt. Is able to write his name with modA, however has difficulty with formulating letter sizes less than 2" in size with 50% legibility for the 3 letters of his name. Eval: Pt is able to hold a thin marker with his left hand, and formulate a line, and initiate a circular pattern (Pt. without glasses today) Goal status: Progressing; Goal revised for more control with smaller writing size  6. Pt. Will reach up to comb/brush his hair  with minA.  Baseline: 09/26/2022: Pt. Is able to use a hair pick for the left side of his head using his left hand. Pt. Presents with difficulty reaching to the right side of his head. 5/13/202: 75% using a hair pick, assist to the right side of the head. 07/20/22: reaches 75% of head, assist for far R side of head, fatigues quickly. 05/25/2022: Pt. Is able to reach up with the left hand to the left side, top, and back of his head. 03/23/2022: Pt. is now able to more consistently initiate reaching up to his head with his left hand in preparation for haircare12/18/2023: Pt. is now able to more consistently initiate reaching up to his head with his left hand in  preparation for haircare. Eval: Pt. is able to initiate reaching up for hair care with a long handled brush, however is unable to sustain UE's in elevation to perform the task.     Goal status: Ongoing  7. Pt. Will independently navigate the w/c through his environment with minA with visual scanning, and hand placement on the controls.  Baseline: 09/26/2022: Pt. Is now navigating  his w/c ouside the home, and around his block. 08/15/2022: Pt. Requires minA to setup hand on controls and MIN cues to navigate the w/c in wide spaces, requires MIN - MOD cues to navigate the w/c through more narrow doorways, and tighter turns - varies based on good vs bad vision days.07/20/22: Pt. Requires minA to setup hand on controls and MIN cues to navigate the w/c in wide spaces, requires MIN - MOD cues to navigate the w/c through more narrow doorways, and tighter turns - varies based on good vs bad vision days. 05/25/2022: Pt. Requires minA  and  cues to navigate the w/c in wide spaces, and requires Mod cues to navigate the w/c through more narrow doorways, and tighter turns. Pt. Requires max cues for scanning through the environment, and moderate cues for hand placement on the controls.     Goal status: Ongoing    8. Pt. Will improve bilateral grip strength to be able to independently grasp, and pull up blankets, and linens.      Baseline: 09/26/2022: R: 0  L: 8# Pt. is attempting to grasp, and pull blankets up more, using mostly the left hand. 08/15/2022: Pt. has difficulty securely holding, and pulling up blankets, and linens.    Goal status: Ongoing   9. Pt. will consistently actively control the releasing  of blankets, covers, and linens from his hands once they are in the desired position over him. Baseline: 09/26/2022: Pt. Is improving with actively releasing blankets form his hands. 08/15/2022: Pt. has difficulty with controlled releasing of blankets/linens from his grasp.     Goal status: Ongoing    10. Pt. Will  improve bilateral UE strength by 2 MM grades to assist with ADLs, and IADLs     Baseline: Right: shoulder flexion: 3+/5, abduction: 4/5, elbow flexion: 4+/5 , extension: 4+/5 wrist extension: 2/5; Left: shoulder flexion: 4+/5, abduction: 4+/5, elbow flexion: 5/5 , extension: 4+/5 wrist extension: 3-/5   ASSESSMENT:  CLINICAL IMPRESSION:  Pt. presents with right elbow, forearm, wrist, and digit PIP, and DIP flexor tightness. Pt. reports they have continued to do more stretching at home. Pt. is improving with using his bilateral hands to doff his gown independently with increased time. Pt. Was able to alternate bilateral hand use to manipulate the sheet, reposition the sheet, as well as pull it up, and down. Pt. is improving with, and continues to work on pulling sheets up over him when in the recliner chair or bed. Pt. Tolerated the BUE exercises well today and was able to perform the boxing. Pt. Was able to add a 4# weight to L hand while boxing. Pt. Required he boxing bag to be slightly in motion to make contact with his L hand. Pt. continues to work on improving BUE functioning, ROM, strength, motor control, [facility] skills, and visual compensatory strategies to be able to independently pull up, and release blankets/covers, improve writing legibility, improve self-care including oral care, and utensil use during self-feeding tasks, and maximize independence with ADLs, and IADLs.     PERFORMANCE DEFICITS in functional skills including ADLs, IADLs, coordination, dexterity, proprioception, ROM, strength, pain, FMC, GMC, decreased knowledge of use of DME, and UE functional use, cognitive skills including safety awareness, and psychosocial skills including coping strategies, environmental adaptation, habits, and routines and behaviors.   IMPAIRMENTS are limiting patient from ADLs, IADLs, leisure, and social participation.   COMORBIDITIES may have co-morbidities  that affects occupational performance. Patient  will benefit from skilled OT to address above  impairments and improve overall function.  MODIFICATION OR ASSISTANCE TO COMPLETE EVALUATION: Maximum or significant modification of tasks or assist is necessary to complete an evaluation.  OT OCCUPATIONAL PROFILE AND HISTORY: Comprehensive assessment: Review of records and extensive additional review of physical, cognitive, psychosocial history related to current functional performance.  CLINICAL DECISION MAKING: High - multiple treatment options, significant modification of task necessary  REHAB POTENTIAL: Fair    EVALUATION COMPLEXITY: High    PLAN: OT FREQUENCY: 2 x's a week  OT DURATION:12 weeks  PLANNED INTERVENTIONS: self care/ADL training, therapeutic exercise, therapeutic activity, neuromuscular re-education, manual therapy, passive range of motion, patient/family education, and cognitive remediation/compensation RECOMMENDED OTHER SERVICES: PT  CONSULTED AND AGREED WITH PLAN OF CARE: Patient and family member/caregiver  PLAN FOR NEXT SESSION: Initiate treatment  Herma Carson OTS,  10/25/2022, 8:01 AM  This entire session was performed under the direct supervision and direction of a licensed therapist. I have personally read, edited, and approve of the note as written.   Olegario Messier, MS, OTR/L  10/25/2022

## 2022-10-26 ENCOUNTER — Ambulatory Visit: Payer: BC Managed Care – PPO

## 2022-10-26 ENCOUNTER — Ambulatory Visit: Payer: BC Managed Care – PPO | Admitting: Occupational Therapy

## 2022-10-26 DIAGNOSIS — I6930 Unspecified sequelae of cerebral infarction: Secondary | ICD-10-CM

## 2022-10-26 DIAGNOSIS — M6281 Muscle weakness (generalized): Secondary | ICD-10-CM | POA: Diagnosis not present

## 2022-10-26 DIAGNOSIS — R278 Other lack of coordination: Secondary | ICD-10-CM

## 2022-10-26 DIAGNOSIS — R269 Unspecified abnormalities of gait and mobility: Secondary | ICD-10-CM

## 2022-10-26 DIAGNOSIS — R262 Difficulty in walking, not elsewhere classified: Secondary | ICD-10-CM | POA: Diagnosis not present

## 2022-10-26 DIAGNOSIS — R2681 Unsteadiness on feet: Secondary | ICD-10-CM | POA: Diagnosis not present

## 2022-10-26 NOTE — Therapy (Cosign Needed)
OCCUPATIONAL THERAPY NEURO TREATMENT NOTE  Patient Name: Paul Hall MRN: [id number] DOB:08/11/1995, 27 y.o., male Today's Date: 10/26/2022  PCP: Dr. Sherwood Gambler REFERRING PROVIDER: Dr. Sherwood Gambler   OT End of Session - 10/26/22 1730     Visit Number [id number]    Number of Visits 60    Date for OT Re-Evaluation 12/19/22    OT Start Time 1615    OT Stop Time 1700    OT Time Calculation (min) 45 min    Equipment Utilized During Treatment tilt in space power wc    Activity Tolerance Patient tolerated treatment well    Behavior During Therapy WFL for tasks assessed/performed                     Past Medical History:  Diagnosis Date   Diabetes mellitus (HCC)    Hypertension    Stroke [facility])    Past Surgical History:  Procedure Laterality Date   IVC FILTER PLACEMENT ([facility] HX)     LEG SURGERY     PEG TUBE PLACEMENT     TRACHEOSTOMY     Patient Active Problem List   Diagnosis Date Noted   Sepsis due to vancomycin resistant Enterococcus species (HCC) 06/06/2019   SIRS (systemic inflammatory response syndrome) (HCC) 06/05/2019   Acute lower UTI 06/05/2019   VRE (vancomycin-resistant Enterococci) infection 06/05/2019   Anemia 06/05/2019   Skin ulcer of sacrum with necrosis of muscle (HCC)    Urinary retention    Type 2 diabetes mellitus without complication, with long-term current use of insulin (HCC)    Tachycardia    Lower extremity edema    Acute metabolic encephalopathy    Obstructive sleep apnea    Morbid obesity with BMI of 60.0-69.9, adult (HCC)    Goals of care, counseling/discussion    Palliative care encounter    Sepsis (HCC) 04/27/2019   H/O insulin dependent diabetes mellitus 04/27/2019   History of CVA with residual deficit 04/27/2019   Seizure disorder (HCC) 04/27/2019   Decubitus ulcer of sacral region, stage 4 (HCC) 04/27/2019    ONSET DATE: 01/2019  REFERRING DIAG: CVA/COVID-19  THERAPY DIAG:  Muscle weakness (generalized)  Other lack of  coordination  Rationale for Evaluation and Treatment Rehabilitation  SUBJECTIVE:   SUBJECTIVE STATEMENT:  Pt. Reports he is doing well today and is going to watch wrestling tonight on TV. Pt accompanied by: self and family member  PERTINENT HISTORY:  Pt. is a 27 y.o. male who was diagnosed with COVID-19, and CVA with resultant quadriplegia. Pt. Was then hospitalized with VRE UTI. PMHx includes: urinary retention, seizure disorder, obstructive sleep disorder, DM Type II, Morbid obesity.   PRECAUTIONS: None  WEIGHT BEARING RESTRICTIONS No  PAIN:  Are you having pain? No pain reported  FALLS: Has patient fallen in last 6 months? No  LIVING ENVIRONMENT: Lives with: lives with their family Lives in: House/apartment Stairs: No Level Entry Has following equipment at home: Wheelchair (power) and hoyer lift, sit to stand lift  PLOF: Independent  PATIENT GOALS: To be able to engage in more daily care tasks.  OBJECTIVE:   HAND DOMINANCE: Right  ADLs:  Transfers/ambulation related to ADLs: Eating: Pt. reports being able to hold standard utensils, and is starting to engage more in self-feeding tasks, hand to mouth patterns. Pt. Reports that he does as much as he can with the task, and family assists  with the remainder of the task. Grooming: Pt. Is able to initiate holding an electric  toothbrush, and brush his teeth. Family assists LB Dressing: Total Assist UE dressing: Pt. is now able to reach up to actively assist with grasping , and pulling his gown down. Toileting:  Total Assist Bathing: MaxA UB, Total assist LB Tub Shower transfers: N/A Equipment: See above    IADLs: Shopping: Relies on family to assist Light housekeeping: Total Assist Meal Prep: Total Assist Community mobility:   Medication management:  Total Assist  Financial management: N/A Handwriting: Not legible: Pt. Is able to hold a pen with the left hand, and initiate marking the page. Pt.'s eye glasses were  not available  MOBILITY STATUS:  Power w/c  POSTURE COMMENTS:  Pt. Requires position changes in his power w/c  ACTIVITY TOLERANCE: Activity tolerance:  Fair  FUNCTIONAL OUTCOME MEASURES: FOTO: 40  TR score: 45  05/25/2022:   FOTO: 50 TR score: 45  07/20/22: FOTO 55  UPPER EXTREMITY ROM     Active ROM Right eval Right 03/21/2022 Right 05/25/2022 R  07/20/22 Right  07/25/22 Right 08/15/2022 Right 09/26/2022 Left eval Left  03/21/2022 Left 05/25/2022 L  07/20/22 Left 07/25/2022 Left  08/15/2022 Left  09/26/2022  Shoulder flexion 106 scaption 105  Scaption 62 flexion 110 scaption 100  105 105 119 123 125 130  136 138  Shoulder abduction 114 90 97 100  105 117 110 115 115 115  118 121  Shoulder adduction                Shoulder extension                Shoulder internal rotation                Shoulder external rotation                Elbow flexion 120(130) 145 145 140  144 140 135(135) 145 145   145   Elbow extension -45(-35) -22(-35) -28(-22)  -32(-21) -32(-28) -28(-26) -27(-18) -21(-20) -20(-16)  -25(-10) -23(-10) -21(-10)  Wrist flexion                Wrist extension -30(10) -25(10) -10(30)  -10(20) -10(25) -20(40) 10(50) 19(50) 28(50)  30(50) 35(55) 36(55)  Wrist ulnar deviation                Wrist radial deviation                Wrist pronation                Wrist supination   Limited by flexor tone       Limited by flexor tone      (Blank rows = not tested)      UPPER EXTREMITY MMT:     Right eval Right 03/21/2022 Right  05/25/2022 R 07/20/22 Right 08/25/2022 Right 09/26/2022 Left eval Left 03/21/2022 Left  05/25/2022 L 07/20/22 Left  08/15/2022 Left 09/26/2022  Shoulder flexion 3-/5 scaption 3-/5 3-/5 3+/5 3+/5 3+/5 3-/5 3/5 3+/5 4+/5 4+/5 4+/5  Shoulder abduction 3-/5 3-/5 3-/5 4/5 4/5 4/5 3-/5 3-/5 4-/5 4+/5 4+/5 4+5  Shoulder adduction              Shoulder extension              Shoulder internal rotation              Shoulder external rotation               Elbow flexion 3/5 3/5 3+/5 4/5 4+/5 4+/5 3/5  3+/5 4-/5 4+/5 5/5 5/5  Elbow extension 2-/5 2/5 3-/5 4+/5 4+/5 4+/5 2-/5 2/5  4+/5 4+/5 4+/5  Wrist flexion              Wrist extension 2-/5 2-/5 2/5 2/5 2/5 2/5 2/5 2+/5 3-/5  3-/5 3-/5  Wrist ulnar deviation              Wrist radial deviation              Wrist pronation              Wrist supination              (Blank rows = not tested)     HAND FUNCTION:  Grip strength: Right: 0 lbs; Left: 0 lbs and Lateral pinch: Right: 5 lbs, Left: 2 lbs  03/21/2022: Lateral pinch: Right: 3.5 lbs, Left: 2 lbs  07/20/22: Grip strength: Right: 3 lbs; Left: 4 lbs and Lateral pinch: Right: 5 lbs, Left: 3 lbs  08/15/2022:  Grip strength: Right: 0 lbs; Left: 5 lbs and Lateral pinch: Right: 2 lbs, Left: 4 lbs  09/26/2022  Grip strength: Right: 0 lbs; Left: 8 lbs and Lateral pinch: Right: 2 lbs, Left: 5 lbs   Bilateral digit PIP/DIP flexion contractures with MP hyperextension with attempts for AROM. Pt. is able to tolerate AROM to the bilateral digits at the initial evaluation however, has a history of pain in the digits.  COORDINATION:  Eval: Pt. is unable to grasp 9-hole test pegs. Pt. is able to initiate grasping larger pegs, and is able to hold a pen in the left hand.  07/20/22: 2 min 36 seconds to remove 9 pegs from 9 hole peg test - cues to locate pegs 2/2 low vision. Pt. is able to initiate grasping larger pegs on R hand and is able to hold a pen in the left hand.  08/15/2022:      Vision, and sensation limiting accuracy of 9 hole peg test results. Pt. Was able to grasp and remove vertical pegs intermittently with cues.    SENSATION: Light touch: Impaired   EDEMA:  N/A  MUSCLE TONE: BUE flexor Spasticity  COGNITION Overall cognitive status: Continue to assess in functional context  VISION:   Subjective report: Pt. was not wearing glasses at the time of the initial eval.  Baseline vision: Vision is very limited. Wears  glasses all the time Visual history: History of impaired vision following CVA. Pt. Has received treatment through the [facility] low vision rehabilitation program.   VISION ASSESSMENT: Impaired To be further assessed in functional context  PERCEPTION: Impaired   PRAXIS: Impaired: motor planning  OBSERVATIONS:  Pt reports being on Tramadol   TODAY'S TREATMENT    Therapeutic EX.:  Pt. worked on [employer] with PROM stretching to the end range for bilateral shoulder flexion, abduction, elbow flexion, extension, prolonged stretching for bilateral forearm supination, wrist extension, digit MP, PIP, and DIP extension, thumb radial abduction. Pt. completed seated BUE therex using boxing speed bag set at head height; intermittent rest breaks 5# wrist weight to L arm.   Self-care:   Pt. worked on bilateral hand [facility] skills grasping sheets while alternating bilateral hands to intentionally pull it up over him. Pt. worked on removing the sheet/pulling it down using bilateral hand [facility] skills to mimic taking his gown off. Pt. Worked on throwing the sheet off of his lap x 3 times.      PATIENT EDUCATION: Education details: There. Ex. technique Person  educated: Patient and Parent Education method: Explanation, Demonstration, Tactile cues, and Verbal cues Education comprehension: verbalized understanding, returned demonstration, verbal cues required, and needs further education   HOME EXERCISE PROGRAM:   Continue ongoing assessment, and continue to provide as needed.     GOALS: Goals reviewed with patient? Yes  SHORT TERM GOALS: Target date: 11/07/2022         To assess splint fit, and make appropriate adjustments to promote good skin integrity through the palmar surface of the bilateral hands.  Baseline: 05/25/22: Goal currently met, however ongoing as needs to assess splint fit arise. 03/23/2022: Pt. is wearing splints a couple of hours at night bilateral resting hand splints. 03/21/2022: Pt. is  wearing splints a couple of hours at night bilateral resting hand splints. Goal status: Continue ongoing assessment as requested, and as needed   LONG TERM GOALS: Target date: 12/19/2022      FOTO score will Improve by 2 points for Pt. perceived improvement with the assessment specific ADL/IADL tasks.  Baseline: 09/27/2022: 51 07/20/2022: FOTO 55 05/25/2022: FOTO score: 50,  TR score: 45 Eval: FOTO score: 40,  TR score: 45 Goal status: Achieved   2.   Pt. will independently perform oral care for 100% of the task after complete set-up. Baseline: 09/26/2022: Completes 90% of the task, proximal assist at times required at the elbow.  08/15/2022: Completes 90% of the task, proximal assist at times required at the elbow. 07/20/22: completes 90% of task, limited by shoulder flexion. 05/25/2022:  Pt. Is able to initiate and perform oral care for approximately 90% of the task. Complete set-up required. Assi needed only for the very back teeth. 03/23/2022: Pt. Is able to initiate and perform oral care for approximately 75% of the task. Pt. Requires assist at proximally at the elbow for through oral care. 03/21/2022: Pt. Is able to initiate and perform oral care for approximately 75% of the task. Pt. Requires assist at proximally at the elbow for through oral care. Eval: Pt. is able to initiate using an electric toothbrush. Pt. requires assist for set-up, and assist for thoroughness, and as he Pt. fatigues. Goal status: Ongoing  3.  Pt. Will be independent with self-feeding for 100% of the meal after complete set-up Baseline:09/26/2022: 90% using the spoon, assit at time with the forearm motion, and grip on the utensils. 100% for finger foods.  08/15/2022:  90% using the spoon, assit at time with the forearm motion, and grip on the utensils. 100% for finger foods-independent with set-up- unsupervised.  07/20/22: self-feeds cereal using spoon 90% of task. 05/25/2022: Pt. Is able to use a spoon to scoop cereal when feeding  himself cereal 85% of the time. Pt. Is able to feed himself snack/finger foods 100% of the time. Pt. Continues to work on consistency of  stabilizing a cup/mug when drinking. Pt. Is able to grasp a water bottle with assist initially, with assist tapering off as he drinks.03/23/2022: Pt. Is able to perform scooping cereal for 75% of the time. Pt. required assist, and support at the left elbow, and Pt. Presents with limited forearm supination when using the spoon, and bringing it towards his mouth. Pt. Is able to use a fork to spear items, and perform the hand to mouth pattern.  03/21/2022: Pt. Is able to perform scooping cereal for 75% of the time. Pt. required assist, and support at the left elbow, and Pt. presents with limited forearm supination when using the spoon, and bringing it towards his mouth.  Pt. Is able to use a fork to spear items, and perform the hand to mouth pattern.  Eval: Pt. is able to hold standard standard utensils. Pt. Performs as much of the task as he, can and has assistance for the remainder. Goal status: Ongoing  4.  Pt. Will improve grasp patterns and consistently grasp 1/4" objects for ADL, and IADL tasks.  Baseline: 09/26/2022: Continues to grasp 1/4" pegs with min a + visual cues, consistently grasping 1/2" objects with visual cues. 08/15/2022: grasps 1/4" pegs with min a + visual cues, consistently grasping 1/2" objects with visual cues. 07/20/22: grasps 1/4" pegs with min a + visual cues, consistently grasping 1/2" objects with visual cues. 05/25/2022: Pt. Is working on improving consistency of grasping 1/2" objects with visual cues.  03/23/2022: Pt. Is able to grasp 1" objects consistently,and continues to work on the hand patterns needed to grasp 1/2" objects.03/21/2022: Pt. Is able to grasp 1" objects consistently,and continues to work on the hand patterns needed to grasp 1/2" objects. Eval: Pt. is able to grasp 1" objects intermittently using a lateral grasp pattern. Goal status:  Progressing; goal revised to 1/4" items  5.  Pt. will independently write his name legibly with letter sizes under 1". Baseline: 09/26/2022: Pt. Continues to be able to write name with smaller letter size. Pt. Is signing name with a computer styus. Pt. Requires visual cues, and assist. 08/15/2022: Pt. Is able to write name with smaller letter size. Pt. Is signing name with a computer styus. Pt. Requires visual cues, and assist. 07/20/22: stabilizing assist to write name with 75% legibility with 2" letters. 05/25/2022: Pt. to continue to work towards formulating grasp patterns in preparation for grasping a large width pen. Pt. Requires visual cues. 03/23/2022: Pt. Is able to write his name with modA, however has difficulty with formulating letter sizes less than 2" in size with 50% legibility for the 3 letters of his name.03/21/2022: Pt. Is able to write his name with modA, however has difficulty with formulating letter sizes less than 2" in size with 50% legibility for the 3 letters of his name. Eval: Pt is able to hold a thin marker with his left hand, and formulate a line, and initiate a circular pattern (Pt. without glasses today) Goal status: Progressing; Goal revised for more control with smaller writing size  6. Pt. Will reach up to comb/brush his hair  with minA.  Baseline: 09/26/2022: Pt. Is able to use a hair pick for the left side of his head using his left hand. Pt. Presents with difficulty reaching to the right side of his head. 5/13/202: 75% using a hair pick, assist to the right side of the head. 07/20/22: reaches 75% of head, assist for far R side of head, fatigues quickly. 05/25/2022: Pt. Is able to reach up with the left hand to the left side, top, and back of his head. 03/23/2022: Pt. is now able to more consistently initiate reaching up to his head with his left hand in preparation for haircare12/18/2023: Pt. is now able to more consistently initiate reaching up to his head with his left hand in  preparation for haircare. Eval: Pt. is able to initiate reaching up for hair care with a long handled brush, however is unable to sustain UE's in elevation to perform the task.     Goal status: Ongoing  7. Pt. Will independently navigate the w/c through his environment with minA with visual scanning, and hand placement on the controls.  Baseline:  09/26/2022: Pt. Is now navigating his w/c ouside the home, and around his block. 08/15/2022: Pt. Requires minA to setup hand on controls and MIN cues to navigate the w/c in wide spaces, requires MIN - MOD cues to navigate the w/c through more narrow doorways, and tighter turns - varies based on good vs bad vision days.07/20/22: Pt. Requires minA to setup hand on controls and MIN cues to navigate the w/c in wide spaces, requires MIN - MOD cues to navigate the w/c through more narrow doorways, and tighter turns - varies based on good vs bad vision days. 05/25/2022: Pt. Requires minA  and  cues to navigate the w/c in wide spaces, and requires Mod cues to navigate the w/c through more narrow doorways, and tighter turns. Pt. Requires max cues for scanning through the environment, and moderate cues for hand placement on the controls.     Goal status: Ongoing    8. Pt. Will improve bilateral grip strength to be able to independently grasp, and pull up blankets, and linens.      Baseline: 09/26/2022: R: 0  L: 8# Pt. is attempting to grasp, and pull blankets up more, using mostly the left hand. 08/15/2022: Pt. has difficulty securely holding, and pulling up blankets, and linens.    Goal status: Ongoing   9. Pt. will consistently actively control the releasing  of blankets, covers, and linens from his hands once they are in the desired position over him. Baseline: 09/26/2022: Pt. Is improving with actively releasing blankets form his hands. 08/15/2022: Pt. has difficulty with controlled releasing of blankets/linens from his grasp.     Goal status: Ongoing    10. Pt. Will  improve bilateral UE strength by 2 MM grades to assist with ADLs, and IADLs     Baseline: Right: shoulder flexion: 3+/5, abduction: 4/5, elbow flexion: 4+/5 , extension: 4+/5 wrist extension: 2/5; Left: shoulder flexion: 4+/5, abduction: 4+/5, elbow flexion: 5/5 , extension: 4+/5 wrist extension: 3-/5   ASSESSMENT:  CLINICAL IMPRESSION:  Pt. presents with right elbow, forearm, wrist, and digit PIP, and DIP flexor tightness. Pt. Continues to improve with using his bilateral hands to doff his gown independently with increased time. Pt. Was able to alternate bilateral hand use to manipulate the sheet, reposition the sheet, as well as pull it up, and down, and throw it off of his lap. Pt. is improving with, and continues to work on pulling sheets up over him when in the recliner chair or bed. Pt. Tolerated the BUE exercises well today and was able to perform the boxing. Pt. Was able to add a 5# weight to L hand while boxing. Pt. Required he boxing bag to be slightly in motion to make contact with his L hand. Pt. continues to work on improving BUE functioning, ROM, strength, motor control, [facility] skills, and visual compensatory strategies to be able to independently pull up, and release blankets/covers, improve writing legibility, improve self-care including oral care, and utensil use during self-feeding tasks, and maximize independence with ADLs, and IADLs.     PERFORMANCE DEFICITS in functional skills including ADLs, IADLs, coordination, dexterity, proprioception, ROM, strength, pain, FMC, GMC, decreased knowledge of use of DME, and UE functional use, cognitive skills including safety awareness, and psychosocial skills including coping strategies, environmental adaptation, habits, and routines and behaviors.   IMPAIRMENTS are limiting patient from ADLs, IADLs, leisure, and social participation.   COMORBIDITIES may have co-morbidities  that affects occupational performance. Patient will benefit from skilled  OT to  address above impairments and improve overall function.  MODIFICATION OR ASSISTANCE TO COMPLETE EVALUATION: Maximum or significant modification of tasks or assist is necessary to complete an evaluation.  OT OCCUPATIONAL PROFILE AND HISTORY: Comprehensive assessment: Review of records and extensive additional review of physical, cognitive, psychosocial history related to current functional performance.  CLINICAL DECISION MAKING: High - multiple treatment options, significant modification of task necessary  REHAB POTENTIAL: Fair    EVALUATION COMPLEXITY: High    PLAN: OT FREQUENCY: 2 x's a week  OT DURATION:12 weeks  PLANNED INTERVENTIONS: self care/ADL training, therapeutic exercise, therapeutic activity, neuromuscular re-education, manual therapy, passive range of motion, patient/family education, and cognitive remediation/compensation RECOMMENDED OTHER SERVICES: PT  CONSULTED AND AGREED WITH PLAN OF CARE: Patient and family member/caregiver  PLAN FOR NEXT SESSION: Initiate treatment  Herma Carson OTS,  10/26/2022, 5:35 PM  This entire session was performed under the direct supervision and direction of a licensed therapist. I have personally read, edited, and approve of the note as written.   Olegario Messier, MS, OTR/L  10/26/2022

## 2022-10-27 NOTE — Therapy (Signed)
OUTPATIENT PHYSICAL THERAPY TREATMENT   Patient Name: Paul Hall MRN: [id number] DOB:1995/11/25, 27 y.o., male Today's Date: 10/27/2022  PCP:  Paul Hall PROVIDER:  Lenise Herald, PA-C   PT End of Session - 10/26/22 1121     Visit Number 114    Number of Visits 128    Date for PT Re-Evaluation 11/30/22    Progress Note Due on Visit 120    PT Start Time 1530    PT Stop Time 1613    PT Time Calculation (min) 43 min    Equipment Utilized During Treatment Gait belt    Activity Tolerance Patient tolerated treatment well;No increased pain;Patient limited by fatigue    Behavior During Therapy [facility] for tasks assessed/performed              Past Medical History:  Diagnosis Date   Diabetes mellitus (HCC)    Hypertension    Stroke [facility])    Past Surgical History:  Procedure Laterality Date   IVC FILTER PLACEMENT ([facility] HX)     LEG SURGERY     PEG TUBE PLACEMENT     TRACHEOSTOMY     Patient Active Problem List   Diagnosis Date Noted   Sepsis due to vancomycin resistant Enterococcus species (HCC) 06/06/2019   SIRS (systemic inflammatory response syndrome) (HCC) 06/05/2019   Acute lower UTI 06/05/2019   VRE (vancomycin-resistant Enterococci) infection 06/05/2019   Anemia 06/05/2019   Skin ulcer of sacrum with necrosis of muscle (HCC)    Urinary retention    Type 2 diabetes mellitus without complication, with long-term current use of insulin (HCC)    Tachycardia    Lower extremity edema    Acute metabolic encephalopathy    Obstructive sleep apnea    Morbid obesity with BMI of 60.0-69.9, adult (HCC)    Goals of care, counseling/discussion    Palliative care encounter    Sepsis (HCC) 04/27/2019   H/O insulin dependent diabetes mellitus 04/27/2019   History of CVA with residual deficit 04/27/2019   Seizure disorder (HCC) 04/27/2019   Decubitus ulcer of sacral region, stage 4 (HCC) 04/27/2019    REFERRING DIAG: Cerebral infarction, unspecified   THERAPY DIAG:   Muscle weakness (generalized)  Other lack of coordination  History of CVA with residual deficit  Abnormality of gait and mobility  Difficulty in walking, not elsewhere classified  Unsteadiness on feet  Rationale for Evaluation and Treatment Rehabilitation  PERTINENT HISTORY: Paul Hall is a 26yoM who presents with severe weakness, quadriparesis, altered sensorium, and visual impairment s/p critical illness and prolonged hospitalization. Pt hospitalized in October 2020 with ARDS 2/2 COVID19 infection. Pt sustained a complex and lengthy hospitalization which included tracheostomy, prolonged sedation, ECMO. In this period pt sustained CVA and SDH. Pt has now been liberated from tracheostomy and G-tube. Pt has since been hospitalized for wound infection and UTI. Pt lives with parents at home, has hospital bed and left chair, hoyer lift transfers, and power WC for mobility needs. Pt needs heavy physical assistance with ADL 2/2 BUE contractures and motor dysfunction   PRECAUTIONS: Fall  SUBJECTIVE:  Patient reports doing okay with no updates since last visit.    PAIN:   Are you having pain? No   TODAY'S TREATMENT:  - 10/26/2022  THERAPEUTIC ACTIVITIES:   *Dependent hoyer transfer with assist of PT/Patient mother from power w/c to middle of large mat table    Ant/post trunk leans-each direction to simulate "nose over toes approach for future transfers." X 20 reps  Sit to stand transfers from EOM- +2 max assist (PT on one side and patient's mother on other) under patient forearms and blocking his knees- x 5 attempts today.   Static standing at EOM- 4 times- varying from approx 10-30 sec- Increased difficulty extending knees and trunk - most likely due to weakness from recently being sick   *at end of session- *Dependent hoyer transfer with assist of PT/Patient mother from mat table back to power w/c    THERAPEUTIC EXERCISE:  (while sitting edge of  mat)  PROM to B hips/knees x  several min Quad sets B Knees x 10 reps AROM LAQ each LE x 10 reps      Pt educated throughout session about proper posture and technique with exercises. Improved exercise technique, movement at target joints, use of target muscles after min to mod verbal, visual, tactile cues.    PATIENT EDUCATION: Education details: Progression of intensity at home with trunk strength exercises and how to grade intensity using a goniometer bubble app.   HOME EXERCISE PROGRAM:  Access Code: [email or web address] URL: [email or web address] Date: 03/23/2022 Prepared by: Paul Hall  Exercises - Supine Bridge  - 3 x weekly - 3 sets - 10 reps - 2 hold - Supine Gluteal Sets  - 3 x weekly - 3 sets - 10 reps - 5 sec hold - Supine Quad Set  - 1 x daily - 3 x weekly - 3 sets - 10 reps - 5 hold - Seated Long Arc Quad  - 1 x daily - 7 x weekly - 3 sets - 10 reps - Seated Hip Adduction Squeeze with Ball  - 1 x daily - 3 x weekly - 3 sets - 10 reps - 5 hold - Seated Hip Abduction  - 1 x daily - 3 x weekly - 3 sets - 10 reps - 2 hold    PT Short Term Goals -       PT SHORT TERM GOAL #1   Title Pt will be independent with HEP in order to improve strength and balance in order to decrease fall risk and improve function at home and work.    Baseline 01/04/2021= No formal HEP in place; 12/12 no HEP in place; 05/10/2021-Patient and his father were able to report compliance with curent HEP consisting of mostly seated/reclined LE strengthening. Both verbalize no questions at this time.    Time 6    Period Weeks    Status Achieved    Target Date 02/15/21            PT LONG TERM GOAL #1     Title Patient will increase BLE gross strength by 1/2 muscle grade to improve functional strength for improved independence with potential gait, increased standing tolerance and increased ADL ability.     Baseline 01/04/2021- Patient presents with 1/5 to 3-/5 B LE strength with MMT; 12/12: goal partially met for Left  knee/hip; 05/10/2021= 2-/5 bilateal Hip flex; 3+/5 bilateral Knee ext; 06/21/2021= Patient presents with 2-/5 bilateral Hip flex; 3+/5 bilateral knee ext/flex; 2-/5 left ankle DF; 0/5 right ankle- and able to increase reps and resistance with LE's. 09/15/2021- Patient technically presents with 2-/5 B hip flex/abd/add - but he is able to raise his hip up to approx 100 deg which has improved. 3+/5 Bilateral knee ext, 2-/5 left ankle and 0/5 right ankle.  12/08/2021= Patient able to lift left knee at 110 deg of hip flex; presents with 3+/5 knee ext, 2-/5 left ankle DF and 0/5  right ankle DF, 2-/5 bilateral Hip abd in seated position.    12/6: R: knee 3+/5 ext, 2/5 flexion, left knee 3+/5 extension, 3+/5 flexion, R hip: 2+/5 hip add, 2+/5 hip ABD L hip: 4-/5 hip ABD, 3+/5 hip ADD, 3+/5 hip flexion; 06/06/2022= Patient now presents with 2-/5 right ankle DF/PF;     Time 12     Period Weeks     Status MET    Target Date 03/07/2022         PT LONG TERM GOAL #2    Title Patient will tolerate sitting unsupported demonstrating erect sitting posture for 15 minutes with CGA to demonstrate improved back extensor strength and improved sitting tolerance.     Baseline 01/04/2021- Patient confied to sitting in lift chair or electric power chair with back support and unable to sit upright without physical assistance; 12/12: tolerates <1 minutes upright unsupported sitting. 05/10/2021=static sit with forward trunk lean  in his power wheelchair without back support x approx 3 min. 06/21/2021=Unable to assess today due to patient with acute back pain but on previous visit able to sit x 8 min without back support. 09/15/2021- on last visit- 09/13/2021- patient was able to sit unsupported x 8 min at edge of mat. 10/13/2023 - Patient was able to sit at edge of mat with varying level of assist today from SBA to min A for a total of 20 min. 12/13/2021= Patient demonstrated unsupported sitting at edge of mat for approx 20 min    Time 12     Period  Weeks     Status GOAL MET    Target Date 12/08/21          PT LONG TERM GOAL #3    Title Patient will demonstrate ability to perform static standing in // bars > 2 min with Max Assist  without loss of balance and fair posture for improved overall strength for pre-gait and transfer activities.     Baseline 01/04/2021= Patient current uanble to stand- Dependent on hoyer or sit to stand lift for transfers. 05/10/2021=Not appropriate yet- Currently still dependent with all transfers using hoyer. 06/21/2021= Patient continuing now to focus on LE strengthening to prepare for standing-unable to try today due to acute low back pain-  planning on attempting in new cert period. 09/15/2021- Patient has attempted standing 2x in past two week- max Assist of 2 people - only once was he successful to clearing his bottom from chair - Will continue to be a focus during the new certification. 12/13/2021= Patient has been limited secondary to increased overall low back pain during this certification and will require more time to focus on this goal.  12/6: not assessed this date, will assess at date when 2-3 PTs are present for assistance     Time 12     Period Weeks     Status GOAL not appropriate at this time - may attempt in future once Patient presents with improved overall LE strength.                 PT LONG TERM GOAL #4    Title Pt will improve FOTO score by 10 points or more demonstrating improved perceived functional ability     Baseline FOTO 7 on 10/17; 03/15/21: FOTO 12; 05/10/2021 06/21/2021= 1; 09/15/2021= 9; 12/13/2021= Will issue next visit 12/6: 4; 06/06/2022= Will assess next visit    Time 12     Period Weeks     Status ONGOING    Target  date 11/30/2022         PT LONG TERM GOAL #5    Title Patient will perform sit to stand transfer with appropriate AD and max assist of 2 people with 75% consistency to prepare for pregait activities.     Baseline 09/15/2021= Patient unable to stand well- unable to clear his  bottom off chair with Max assist of 2 persons. 12/13/2021- Goal not appropriate to try yet but will keep and roll over to next cert as shift continues to focus on transfers/standing; 4/24= Patient able to perform active ankle DF/PF with right LE and able to raise his knee into seated march and clear floor without physical assist today - as previously unable as well as lift right knee ext to near full ROM to improve strength for eventual standing. 09/07/2022- Goal continues to not be appropriate but patient is now standing some and will keep goal active    Time 12     Period Weeks     Status Goal still not appropriate for now but will keep active for future    Target date         PT LONG TERM GOAL #6  Title Patient will tolerate sitting unsupported demonstrating erect sitting posture for 30 minutes with CGA to demonstrate improved back extensor strength and improved sitting tolerance.   Baseline 12/13/2021= Patient demonstrated unsupported sitting at edge of mat for approx 20 min;  12/29/2021- Patient performed approx 30 min of dynamic sitting activities today. 06/06/2022= Patient demonstrated ability to sit and perform static and dynamic UE/LE movement with only Supervision.   Time 12   Period Weeks   Status GOAL MET           7.  Patient will tolerate 5 minutes or more of standing in sit to stand lift or with max assist +2 in order to indicate improved lower extremity weightbearing tolerance for progression to standing in parallel bars. Baseline: 1 minute on most recent stand 02/21/22; 06/06/2022- Patient did attempt today after complaining of right LE pain last week. He attempted 3 stands using sit to stand lift. - all over 1 min- last one approx 48 sec- stopped due to fatigue. More erect standing in lift today - still poor gluteal strength but able to activate glutes and extend hips upon command but unable to hold > 5 sec. 07/28/2022- Will attempt standing next visit but patient has been able to stand  since last progress note for up to 3 min at a time at his best. 09/07/2022-  attempted standing with max +2 and patient able to stand 15-20 sec so will now  revise goal Goal status: GOAL Revised/ongoing Target date: 11/30/2022   8. Patient will tolerate sitting unsupported demonstrating ability to perform dynamic UE/LE activities for 30 minutes  independently to demonstrate ability to sit at edge of bed to eat or perform some ADL's/exercise for optimal quality of life.             Baseline: 06/06/2022- Patient able to sit and perform some UE/LE exercises but requires CGA at times for safety- he is able to static sit for 30 min with supervision. 07/27/2022= Patient able to now sit > 30 min with Supervision only - performing static and dynamic activities. Will keep goal active to ensure patient is consistent. 09/07/2022=  Patient able to demonstrate consistent ability to sit at edge of mat during visits             Goal Status:  MET             Target date: 08/29/2022        Plan     Clinical Impression Statement Patient able to attempt to resume standing today. He was well motivated but presented with increased overall weakness- unable to terminally extend knees or trunk today. He was able to attempt more trials but unable to clear his bottom well off mat with his best effort at end of session.  Pt will continue to benefit from skilled physical therapy intervention to address impairments, improve QOL, and attain therapy goals.    Personal Factors and Comorbidities Comorbidity 3+;Time since onset of injury/illness/exacerbation    Comorbidities CVA, diabetes, Seizures    Examination-Activity Limitations Bathing;Bed Mobility;Bend;Caring for Others;Carry;Dressing;Hygiene/Grooming;Lift;Locomotion Level;Reach Overhead;Self Feeding;Sit;Squat;Stairs;Stand;Transfers;Toileting    Examination-Participation Restrictions Cleaning;Community Activity;Driving;Laundry;Medication Management;Meal Prep;Occupation;Personal  Finances;Shop;Yard Work;Volunteer    Stability/Clinical Decision Making Evolving/Moderate complexity    Rehab Potential Fair    PT Frequency 2x / week    PT Duration 12 weeks    PT Treatment/Interventions ADLs/Self Care Home Management;Cryotherapy;Electrical Stimulation;Moist Heat;Ultrasound;DME Instruction;Gait training;Stair training;Functional mobility training;Therapeutic exercise;Balance training;Patient/family education;Orthotic Fit/Training;Neuromuscular re-education;Wheelchair mobility training;Manual techniques;Passive range of motion;Dry needling;Energy conservation;Taping;Visual/perceptual remediation/compensation;Joint Manipulations    PT Next Visit Plan core strength- sitting at EOM, postural control, sit to stand if able; Continue with progressive LE Strengthening. Stand activities as appropriate.     PT Home Exercise Plan No changes to HEP today    Consulted and Agree with Plan of Care Patient;Family member/caregiver    Family Member Consulted Mom             11:27 AM, 10/27/22 Louis Meckel, PT Physical Therapist - [facility]  Outpatient Physical Therapy- [facility] [phone]     10/27/2022, 11:27 AM

## 2022-10-31 ENCOUNTER — Ambulatory Visit: Payer: BC Managed Care – PPO | Admitting: Occupational Therapy

## 2022-10-31 ENCOUNTER — Ambulatory Visit: Payer: BC Managed Care – PPO

## 2022-11-02 ENCOUNTER — Ambulatory Visit: Payer: BC Managed Care – PPO

## 2022-11-02 ENCOUNTER — Ambulatory Visit: Payer: BC Managed Care – PPO | Admitting: Occupational Therapy

## 2022-11-02 DIAGNOSIS — M6281 Muscle weakness (generalized): Secondary | ICD-10-CM | POA: Diagnosis not present

## 2022-11-02 DIAGNOSIS — R278 Other lack of coordination: Secondary | ICD-10-CM

## 2022-11-02 DIAGNOSIS — I6930 Unspecified sequelae of cerebral infarction: Secondary | ICD-10-CM | POA: Diagnosis not present

## 2022-11-02 DIAGNOSIS — R262 Difficulty in walking, not elsewhere classified: Secondary | ICD-10-CM | POA: Diagnosis not present

## 2022-11-02 DIAGNOSIS — R269 Unspecified abnormalities of gait and mobility: Secondary | ICD-10-CM | POA: Diagnosis not present

## 2022-11-02 DIAGNOSIS — R2681 Unsteadiness on feet: Secondary | ICD-10-CM | POA: Diagnosis not present

## 2022-11-02 NOTE — Therapy (Cosign Needed Addendum)
OCCUPATIONAL THERAPY NEURO TREATMENT NOTE  Patient Name: Paul Hall MRN: [id number] DOB:March 22, 1996, 27 y.o., male Today's Date: 11/02/2022  PCP: Dr. Sherwood Gambler REFERRING PROVIDER: Dr. Sherwood Gambler   OT End of Session - 11/02/22 1707     Visit Number [id number]    Number of Visits 60    Date for OT Re-Evaluation 12/19/22    OT Start Time 1615    OT Stop Time 1700    OT Time Calculation (min) 45 min    Equipment Utilized During Treatment tilt in space power wc    Activity Tolerance Patient tolerated treatment well    Behavior During Therapy WFL for tasks assessed/performed                     Past Medical History:  Diagnosis Date   Diabetes mellitus (HCC)    Hypertension    Stroke [facility])    Past Surgical History:  Procedure Laterality Date   IVC FILTER PLACEMENT ([facility] HX)     LEG SURGERY     PEG TUBE PLACEMENT     TRACHEOSTOMY     Patient Active Problem List   Diagnosis Date Noted   Sepsis due to vancomycin resistant Enterococcus species (HCC) 06/06/2019   SIRS (systemic inflammatory response syndrome) (HCC) 06/05/2019   Acute lower UTI 06/05/2019   VRE (vancomycin-resistant Enterococci) infection 06/05/2019   Anemia 06/05/2019   Skin ulcer of sacrum with necrosis of muscle (HCC)    Urinary retention    Type 2 diabetes mellitus without complication, with long-term current use of insulin (HCC)    Tachycardia    Lower extremity edema    Acute metabolic encephalopathy    Obstructive sleep apnea    Morbid obesity with BMI of 60.0-69.9, adult (HCC)    Goals of care, counseling/discussion    Palliative care encounter    Sepsis (HCC) 04/27/2019   H/O insulin dependent diabetes mellitus 04/27/2019   History of CVA with residual deficit 04/27/2019   Seizure disorder (HCC) 04/27/2019   Decubitus ulcer of sacral region, stage 4 (HCC) 04/27/2019    ONSET DATE: 01/2019  REFERRING DIAG: CVA/COVID-19  THERAPY DIAG:  Muscle weakness (generalized)  Other lack of  coordination  Rationale for Evaluation and Treatment Rehabilitation  SUBJECTIVE:   SUBJECTIVE STATEMENT:  Pt. Reports he is doing well today. Pt. Reports he is excited about his upcoming wrestling outing.  Pt accompanied by: self and family member  PERTINENT HISTORY:  Pt. is a 27 y.o. male who was diagnosed with COVID-19, and CVA with resultant quadriplegia. Pt. Was then hospitalized with VRE UTI. PMHx includes: urinary retention, seizure disorder, obstructive sleep disorder, DM Type II, Morbid obesity.   PRECAUTIONS: None  WEIGHT BEARING RESTRICTIONS No  PAIN:  Are you having pain? No pain reported  FALLS: Has patient fallen in last 6 months? No  LIVING ENVIRONMENT: Lives with: lives with their family Lives in: House/apartment Stairs: No Level Entry Has following equipment at home: Wheelchair (power) and hoyer lift, sit to stand lift  PLOF: Independent  PATIENT GOALS: To be able to engage in more daily care tasks.  OBJECTIVE:   HAND DOMINANCE: Right  ADLs:  Transfers/ambulation related to ADLs: Eating: Pt. reports being able to hold standard utensils, and is starting to engage more in self-feeding tasks, hand to mouth patterns. Pt. Reports that he does as much as he can with the task, and family assists  with the remainder of the task. Grooming: Pt. Is able to initiate holding  an electric toothbrush, and brush his teeth. Family assists LB Dressing: Total Assist UE dressing: Pt. is now able to reach up to actively assist with grasping , and pulling his gown down. Toileting:  Total Assist Bathing: MaxA UB, Total assist LB Tub Shower transfers: N/A Equipment: See above    IADLs: Shopping: Relies on family to assist Light housekeeping: Total Assist Meal Prep: Total Assist Community mobility:   Medication management:  Total Assist  Financial management: N/A Handwriting: Not legible: Pt. Is able to hold a pen with the left hand, and initiate marking the page. Pt.'s  eye glasses were not available  MOBILITY STATUS:  Power w/c  POSTURE COMMENTS:  Pt. Requires position changes in his power w/c  ACTIVITY TOLERANCE: Activity tolerance:  Fair  FUNCTIONAL OUTCOME MEASURES: FOTO: 40  TR score: 45  05/25/2022:   FOTO: 50 TR score: 45  07/20/22: FOTO 55  UPPER EXTREMITY ROM     Active ROM Right eval Right 03/21/2022 Right 05/25/2022 R  07/20/22 Right  07/25/22 Right 08/15/2022 Right 09/26/2022 Left eval Left  03/21/2022 Left 05/25/2022 L  07/20/22 Left 07/25/2022 Left  08/15/2022 Left  09/26/2022  Shoulder flexion 106 scaption 105  Scaption 62 flexion 110 scaption 100  105 105 119 123 125 130  136 138  Shoulder abduction 114 90 97 100  105 117 110 115 115 115  118 121  Shoulder adduction                Shoulder extension                Shoulder internal rotation                Shoulder external rotation                Elbow flexion 120(130) 145 145 140  144 140 135(135) 145 145   145   Elbow extension -45(-35) -22(-35) -28(-22)  -32(-21) -32(-28) -28(-26) -27(-18) -21(-20) -20(-16)  -25(-10) -23(-10) -21(-10)  Wrist flexion                Wrist extension -30(10) -25(10) -10(30)  -10(20) -10(25) -20(40) 10(50) 19(50) 28(50)  30(50) 35(55) 36(55)  Wrist ulnar deviation                Wrist radial deviation                Wrist pronation                Wrist supination   Limited by flexor tone       Limited by flexor tone      (Blank rows = not tested)      UPPER EXTREMITY MMT:     Right eval Right 03/21/2022 Right  05/25/2022 R 07/20/22 Right 08/25/2022 Right 09/26/2022 Left eval Left 03/21/2022 Left  05/25/2022 L 07/20/22 Left  08/15/2022 Left 09/26/2022  Shoulder flexion 3-/5 scaption 3-/5 3-/5 3+/5 3+/5 3+/5 3-/5 3/5 3+/5 4+/5 4+/5 4+/5  Shoulder abduction 3-/5 3-/5 3-/5 4/5 4/5 4/5 3-/5 3-/5 4-/5 4+/5 4+/5 4+5  Shoulder adduction              Shoulder extension              Shoulder internal rotation              Shoulder external  rotation              Elbow flexion 3/5 3/5 3+/5 4/5 4+/5  4+/5 3/5 3+/5 4-/5 4+/5 5/5 5/5  Elbow extension 2-/5 2/5 3-/5 4+/5 4+/5 4+/5 2-/5 2/5  4+/5 4+/5 4+/5  Wrist flexion              Wrist extension 2-/5 2-/5 2/5 2/5 2/5 2/5 2/5 2+/5 3-/5  3-/5 3-/5  Wrist ulnar deviation              Wrist radial deviation              Wrist pronation              Wrist supination              (Blank rows = not tested)     HAND FUNCTION:  Grip strength: Right: 0 lbs; Left: 0 lbs and Lateral pinch: Right: 5 lbs, Left: 2 lbs  03/21/2022: Lateral pinch: Right: 3.5 lbs, Left: 2 lbs  07/20/22: Grip strength: Right: 3 lbs; Left: 4 lbs and Lateral pinch: Right: 5 lbs, Left: 3 lbs  08/15/2022:  Grip strength: Right: 0 lbs; Left: 5 lbs and Lateral pinch: Right: 2 lbs, Left: 4 lbs  09/26/2022  Grip strength: Right: 0 lbs; Left: 8 lbs and Lateral pinch: Right: 2 lbs, Left: 5 lbs   Bilateral digit PIP/DIP flexion contractures with MP hyperextension with attempts for AROM. Pt. is able to tolerate AROM to the bilateral digits at the initial evaluation however, has a history of pain in the digits.  COORDINATION:  Eval: Pt. is unable to grasp 9-hole test pegs. Pt. is able to initiate grasping larger pegs, and is able to hold a pen in the left hand.  07/20/22: 2 min 36 seconds to remove 9 pegs from 9 hole peg test - cues to locate pegs 2/2 low vision. Pt. is able to initiate grasping larger pegs on R hand and is able to hold a pen in the left hand.  08/15/2022:      Vision, and sensation limiting accuracy of 9 hole peg test results. Pt. Was able to grasp and remove vertical pegs intermittently with cues.    SENSATION: Light touch: Impaired   EDEMA:  N/A  MUSCLE TONE: BUE flexor Spasticity  COGNITION Overall cognitive status: Continue to assess in functional context  VISION:   Subjective report: Pt. was not wearing glasses at the time of the initial eval.  Baseline vision: Vision is very  limited. Wears glasses all the time Visual history: History of impaired vision following CVA. Pt. Has received treatment through [facility] low vision rehabilitation program.   VISION ASSESSMENT: Impaired To be further assessed in functional context  PERCEPTION: Impaired   PRAXIS: Impaired: motor planning  OBSERVATIONS:  Pt reports being on Tramadol   TODAY'S TREATMENT    Therapeutic EX.:  Pt. worked on [employer] with PROM stretching to the end range for bilateral shoulder flexion, abduction, elbow flexion, extension, prolonged stretching for bilateral forearm supination, wrist extension, digit MP, PIP, and DIP extension, thumb radial abduction for both the RUE and LUE. Pt. Worked on elbow extension exercises with the yellow theraband.   Manual Therapy:   Metacarpal spread stretches to the R hand were performed. Manual therapy was performed independently and in preparation of therapeutic exercise.      PATIENT EDUCATION: Education details: There. Halford Decamp Person educated: Patient and Parent Education method: Explanation, Demonstration, Tactile cues, and Verbal cues Education comprehension: verbalized understanding, returned demonstration, verbal cues required, and needs further education   HOME EXERCISE PROGRAM:   Continue ongoing  assessment, and continue to provide as needed.     GOALS: Goals reviewed with patient? Yes  SHORT TERM GOALS: Target date: 11/07/2022         To assess splint fit, and make appropriate adjustments to promote good skin integrity through the palmar surface of the bilateral hands.  Baseline: 05/25/22: Goal currently met, however ongoing as needs to assess splint fit arise. 03/23/2022: Pt. is wearing splints a couple of hours at night bilateral resting hand splints. 03/21/2022: Pt. is wearing splints a couple of hours at night bilateral resting hand splints. Goal status: Continue ongoing assessment as requested, and as needed   LONG TERM GOALS:  Target date: 12/19/2022      FOTO score will Improve by 2 points for Pt. perceived improvement with the assessment specific ADL/IADL tasks.  Baseline: 09/27/2022: 51 07/20/2022: FOTO 55 05/25/2022: FOTO score: 50,  TR score: 45 Eval: FOTO score: 40,  TR score: 45 Goal status: Achieved   2.   Pt. will independently perform oral care for 100% of the task after complete set-up. Baseline: 09/26/2022: Completes 90% of the task, proximal assist at times required at the elbow.  08/15/2022: Completes 90% of the task, proximal assist at times required at the elbow. 07/20/22: completes 90% of task, limited by shoulder flexion. 05/25/2022:  Pt. Is able to initiate and perform oral care for approximately 90% of the task. Complete set-up required. Assi needed only for the very back teeth. 03/23/2022: Pt. Is able to initiate and perform oral care for approximately 75% of the task. Pt. Requires assist at proximally at the elbow for through oral care. 03/21/2022: Pt. Is able to initiate and perform oral care for approximately 75% of the task. Pt. Requires assist at proximally at the elbow for through oral care. Eval: Pt. is able to initiate using an electric toothbrush. Pt. requires assist for set-up, and assist for thoroughness, and as he Pt. fatigues. Goal status: Ongoing  3.  Pt. Will be independent with self-feeding for 100% of the meal after complete set-up Baseline:09/26/2022: 90% using the spoon, assit at time with the forearm motion, and grip on the utensils. 100% for finger foods.  08/15/2022:  90% using the spoon, assit at time with the forearm motion, and grip on the utensils. 100% for finger foods-independent with set-up- unsupervised.  07/20/22: self-feeds cereal using spoon 90% of task. 05/25/2022: Pt. Is able to use a spoon to scoop cereal when feeding himself cereal 85% of the time. Pt. Is able to feed himself snack/finger foods 100% of the time. Pt. Continues to work on consistency of  stabilizing a cup/mug when  drinking. Pt. Is able to grasp a water bottle with assist initially, with assist tapering off as he drinks.03/23/2022: Pt. Is able to perform scooping cereal for 75% of the time. Pt. required assist, and support at the left elbow, and Pt. Presents with limited forearm supination when using the spoon, and bringing it towards his mouth. Pt. Is able to use a fork to spear items, and perform the hand to mouth pattern.  03/21/2022: Pt. Is able to perform scooping cereal for 75% of the time. Pt. required assist, and support at the left elbow, and Pt. presents with limited forearm supination when using the spoon, and bringing it towards his mouth. Pt. Is able to use a fork to spear items, and perform the hand to mouth pattern.  Eval: Pt. is able to hold standard standard utensils. Pt. Performs as much of the task as  he, can and has assistance for the remainder. Goal status: Ongoing  4.  Pt. Will improve grasp patterns and consistently grasp 1/4" objects for ADL, and IADL tasks.  Baseline: 09/26/2022: Continues to grasp 1/4" pegs with min a + visual cues, consistently grasping 1/2" objects with visual cues. 08/15/2022: grasps 1/4" pegs with min a + visual cues, consistently grasping 1/2" objects with visual cues. 07/20/22: grasps 1/4" pegs with min a + visual cues, consistently grasping 1/2" objects with visual cues. 05/25/2022: Pt. Is working on improving consistency of grasping 1/2" objects with visual cues.  03/23/2022: Pt. Is able to grasp 1" objects consistently,and continues to work on the hand patterns needed to grasp 1/2" objects.03/21/2022: Pt. Is able to grasp 1" objects consistently,and continues to work on the hand patterns needed to grasp 1/2" objects. Eval: Pt. is able to grasp 1" objects intermittently using a lateral grasp pattern. Goal status: Progressing; goal revised to 1/4" items  5.  Pt. will independently write his name legibly with letter sizes under 1". Baseline: 09/26/2022: Pt. Continues to be  able to write name with smaller letter size. Pt. Is signing name with a computer styus. Pt. Requires visual cues, and assist. 08/15/2022: Pt. Is able to write name with smaller letter size. Pt. Is signing name with a computer styus. Pt. Requires visual cues, and assist. 07/20/22: stabilizing assist to write name with 75% legibility with 2" letters. 05/25/2022: Pt. to continue to work towards formulating grasp patterns in preparation for grasping a large width pen. Pt. Requires visual cues. 03/23/2022: Pt. Is able to write his name with modA, however has difficulty with formulating letter sizes less than 2" in size with 50% legibility for the 3 letters of his name.03/21/2022: Pt. Is able to write his name with modA, however has difficulty with formulating letter sizes less than 2" in size with 50% legibility for the 3 letters of his name. Eval: Pt is able to hold a thin marker with his left hand, and formulate a line, and initiate a circular pattern (Pt. without glasses today) Goal status: Progressing; Goal revised for more control with smaller writing size  6. Pt. Will reach up to comb/brush his hair  with minA.  Baseline: 09/26/2022: Pt. Is able to use a hair pick for the left side of his head using his left hand. Pt. Presents with difficulty reaching to the right side of his head. 5/13/202: 75% using a hair pick, assist to the right side of the head. 07/20/22: reaches 75% of head, assist for far R side of head, fatigues quickly. 05/25/2022: Pt. Is able to reach up with the left hand to the left side, top, and back of his head. 03/23/2022: Pt. is now able to more consistently initiate reaching up to his head with his left hand in preparation for haircare12/18/2023: Pt. is now able to more consistently initiate reaching up to his head with his left hand in preparation for haircare. Eval: Pt. is able to initiate reaching up for hair care with a long handled brush, however is unable to sustain UE's in elevation to  perform the task.     Goal status: Ongoing  7. Pt. Will independently navigate the w/c through his environment with minA with visual scanning, and hand placement on the controls.  Baseline: 09/26/2022: Pt. Is now navigating his w/c ouside the home, and around his block. 08/15/2022: Pt. Requires minA to setup hand on controls and MIN cues to navigate the w/c in wide spaces, requires MIN -  MOD cues to navigate the w/c through more narrow doorways, and tighter turns - varies based on good vs bad vision days.07/20/22: Pt. Requires minA to setup hand on controls and MIN cues to navigate the w/c in wide spaces, requires MIN - MOD cues to navigate the w/c through more narrow doorways, and tighter turns - varies based on good vs bad vision days. 05/25/2022: Pt. Requires minA  and  cues to navigate the w/c in wide spaces, and requires Mod cues to navigate the w/c through more narrow doorways, and tighter turns. Pt. Requires max cues for scanning through the environment, and moderate cues for hand placement on the controls.     Goal status: Ongoing    8. Pt. Will improve bilateral grip strength to be able to independently grasp, and pull up blankets, and linens.      Baseline: 09/26/2022: R: 0  L: 8# Pt. is attempting to grasp, and pull blankets up more, using mostly the left hand. 08/15/2022: Pt. has difficulty securely holding, and pulling up blankets, and linens.    Goal status: Ongoing   9. Pt. will consistently actively control the releasing  of blankets, covers, and linens from his hands once they are in the desired position over him. Baseline: 09/26/2022: Pt. Is improving with actively releasing blankets form his hands. 08/15/2022: Pt. has difficulty with controlled releasing of blankets/linens from his grasp.     Goal status: Ongoing    10. Pt. Will improve bilateral UE strength by 2 MM grades to assist with ADLs, and IADLs     Baseline: Right: shoulder flexion: 3+/5, abduction: 4/5, elbow flexion: 4+/5 ,  extension: 4+/5 wrist extension: 2/5; Left: shoulder flexion: 4+/5, abduction: 4+/5, elbow flexion: 5/5 , extension: 4+/5 wrist extension: 3-/5   ASSESSMENT:  CLINICAL IMPRESSION:  Pt. presents with right elbow, forearm, wrist, and digit PIP, and DIP flexor tightness today. Pt. Tolerated all manual therapy and therapeutic exercise well today. PROM of R hand digit extension was performed to place R hand flat on swiss therapy ball to use for future for elbow extension exercises. Pt. Was able to perform R arm elbow extension using the Yellow theraband with increased verbal cues to ensure proper form. Pt. Was encouraged to start using theraband at home to help with R elbow extension. Pt. continues to work on improving BUE functioning, ROM, strength, motor control, [facility] skills, and visual compensatory strategies to be able to independently pull up, and release blankets/covers, improve writing legibility, improve self-care including oral care, and utensil use during self-feeding tasks, and maximize independence with ADLs, and IADLs.     PERFORMANCE DEFICITS in functional skills including ADLs, IADLs, coordination, dexterity, proprioception, ROM, strength, pain, FMC, GMC, decreased knowledge of use of DME, and UE functional use, cognitive skills including safety awareness, and psychosocial skills including coping strategies, environmental adaptation, habits, and routines and behaviors.   IMPAIRMENTS are limiting patient from ADLs, IADLs, leisure, and social participation.   COMORBIDITIES may have co-morbidities  that affects occupational performance. Patient will benefit from skilled OT to address above impairments and improve overall function.  MODIFICATION OR ASSISTANCE TO COMPLETE EVALUATION: Maximum or significant modification of tasks or assist is necessary to complete an evaluation.  OT OCCUPATIONAL PROFILE AND HISTORY: Comprehensive assessment: Review of records and extensive additional review of  physical, cognitive, psychosocial history related to current functional performance.  CLINICAL DECISION MAKING: High - multiple treatment options, significant modification of task necessary  REHAB POTENTIAL: Fair    EVALUATION COMPLEXITY:  High    PLAN: OT FREQUENCY: 2 x's a week  OT DURATION:12 weeks  PLANNED INTERVENTIONS: self care/ADL training, therapeutic exercise, therapeutic activity, neuromuscular re-education, manual therapy, passive range of motion, patient/family education, and cognitive remediation/compensation RECOMMENDED OTHER SERVICES: PT  CONSULTED AND AGREED WITH PLAN OF CARE: Patient and family member/caregiver  PLAN FOR NEXT SESSION: Initiate treatment  Herma Carson OTS,  11/02/2022, 5:21 PM  This entire session was performed under the direct supervision and direction of a licensed therapist. I have personally read, edited, and approve of the note as written.   Olegario Messier, MS, OTR/L   11/02/2022

## 2022-11-02 NOTE — Therapy (Signed)
OUTPATIENT PHYSICAL THERAPY TREATMENT   Patient Name: Paul Hall MRN: [id number] DOB:Feb 22, 1996, 26 y.o., male Today's Date: 11/02/2022  PCP:  Cindi Carbon PROVIDER:  Lenise Herald, PA-C   PT End of Session - 11/02/22 1648     Visit Number 115    Number of Visits 128    Date for PT Re-Evaluation 11/30/22    Progress Note Due on Visit 120    PT Start Time 1534    PT Stop Time 1615    PT Time Calculation (min) 41 min    Equipment Utilized During Treatment Gait belt    Activity Tolerance Patient tolerated treatment well;No increased pain;Patient limited by fatigue    Behavior During Therapy [facility] for tasks assessed/performed              Past Medical History:  Diagnosis Date   Diabetes mellitus (HCC)    Hypertension    Stroke [facility])    Past Surgical History:  Procedure Laterality Date   IVC FILTER PLACEMENT ([facility] HX)     LEG SURGERY     PEG TUBE PLACEMENT     TRACHEOSTOMY     Patient Active Problem List   Diagnosis Date Noted   Sepsis due to vancomycin resistant Enterococcus species (HCC) 06/06/2019   SIRS (systemic inflammatory response syndrome) (HCC) 06/05/2019   Acute lower UTI 06/05/2019   VRE (vancomycin-resistant Enterococci) infection 06/05/2019   Anemia 06/05/2019   Skin ulcer of sacrum with necrosis of muscle (HCC)    Urinary retention    Type 2 diabetes mellitus without complication, with long-term current use of insulin (HCC)    Tachycardia    Lower extremity edema    Acute metabolic encephalopathy    Obstructive sleep apnea    Morbid obesity with BMI of 60.0-69.9, adult (HCC)    Goals of care, counseling/discussion    Palliative care encounter    Sepsis (HCC) 04/27/2019   H/O insulin dependent diabetes mellitus 04/27/2019   History of CVA with residual deficit 04/27/2019   Seizure disorder (HCC) 04/27/2019   Decubitus ulcer of sacral region, stage 4 (HCC) 04/27/2019    REFERRING DIAG: Cerebral infarction, unspecified   THERAPY DIAG:   Muscle weakness (generalized)  Other lack of coordination  History of CVA with residual deficit  Abnormality of gait and mobility  Difficulty in walking, not elsewhere classified  Unsteadiness on feet  Rationale for Evaluation and Treatment Rehabilitation  PERTINENT HISTORY: Paul Hall is a 26yoM who presents with severe weakness, quadriparesis, altered sensorium, and visual impairment s/p critical illness and prolonged hospitalization. Pt hospitalized in October 2020 with ARDS 2/2 COVID19 infection. Pt sustained a complex and lengthy hospitalization which included tracheostomy, prolonged sedation, ECMO. In this period pt sustained CVA and SDH. Pt has now been liberated from tracheostomy and G-tube. Pt has since been hospitalized for wound infection and UTI. Pt lives with parents at home, has hospital bed and left chair, hoyer lift transfers, and power WC for mobility needs. Pt needs heavy physical assistance with ADL 2/2 BUE contractures and motor dysfunction   PRECAUTIONS: Fall  SUBJECTIVE:  Patient reports doing okay and states compliant with HEP with assist from his mom.   PAIN:   Are you having pain? No   TODAY'S TREATMENT:  - 11/02/22   THERAPEUTIC ACTIVITIES:   *Dependent hoyer transfer with assist of PT/Patient mother from power w/c to middle of large mat table    Lateral weight shift sitting at EOM - left to right x 20 -  Min VC to lean further to right side.  Ant/post trunk weight shift at [place] [place]- Reminders for "nose over toes"   Sit to stand transfers from EOM- +2 max assist (PT on one side and patient's mother on other) under patient forearms and Bilateral knee blocking x 3.   Static standing at EOM- 3 times- varying from approx 10-32 sec- Much improved ability to stand and able to minimal clear bottom from edge of mat. States felt more comfortable with Mom blocking his right knee anteriorly vs. Laterally.    *at end of session- *Dependent hoyer transfer with  assist of PT/Patient mother from mat table back to power w/c    THERAPEUTIC EXERCISE:  (while sitting edge of  mat)   AROM LAQ each LE x 15 reps    Seated Hip march each LE x 15 reps Bilateral UE reaching (arms overhead- min on right with elbow flexed)  Unilateral weight shift and raise arm ipsilateral side x 12 reps each side.   Pt educated throughout session about proper posture and technique with exercises. Improved exercise technique, movement at target joints, use of target muscles after min to mod verbal, visual, tactile cues.    PATIENT EDUCATION: Education details: Progression of intensity at home with trunk strength exercises and how to grade intensity using a goniometer bubble app.   HOME EXERCISE PROGRAM:  Access Code: [email or web address] URL: [email or web address] Date: 03/23/2022 Prepared by: Maureen Ralphs  Exercises - Supine Bridge  - 3 x weekly - 3 sets - 10 reps - 2 hold - Supine Gluteal Sets  - 3 x weekly - 3 sets - 10 reps - 5 sec hold - Supine Quad Set  - 1 x daily - 3 x weekly - 3 sets - 10 reps - 5 hold - Seated Long Arc Quad  - 1 x daily - 7 x weekly - 3 sets - 10 reps - Seated Hip Adduction Squeeze with Ball  - 1 x daily - 3 x weekly - 3 sets - 10 reps - 5 hold - Seated Hip Abduction  - 1 x daily - 3 x weekly - 3 sets - 10 reps - 2 hold    PT Short Term Goals -       PT SHORT TERM GOAL #1   Title Pt will be independent with HEP in order to improve strength and balance in order to decrease fall risk and improve function at home and work.    Baseline 01/04/2021= No formal HEP in place; 12/12 no HEP in place; 05/10/2021-Patient and his father were able to report compliance with curent HEP consisting of mostly seated/reclined LE strengthening. Both verbalize no questions at this time.    Time 6    Period Weeks    Status Achieved    Target Date 02/15/21            PT LONG TERM GOAL #1     Title Patient will increase BLE gross strength by 1/2  muscle grade to improve functional strength for improved independence with potential gait, increased standing tolerance and increased ADL ability.     Baseline 01/04/2021- Patient presents with 1/5 to 3-/5 B LE strength with MMT; 12/12: goal partially met for Left knee/hip; 05/10/2021= 2-/5 bilateal Hip flex; 3+/5 bilateral Knee ext; 06/21/2021= Patient presents with 2-/5 bilateral Hip flex; 3+/5 bilateral knee ext/flex; 2-/5 left ankle DF; 0/5 right ankle- and able to increase reps and resistance with LE's. 09/15/2021- Patient technically presents with 2-/5 B hip flex/abd/add -  but he is able to raise his hip up to approx 100 deg which has improved. 3+/5 Bilateral knee ext, 2-/5 left ankle and 0/5 right ankle.  12/08/2021= Patient able to lift left knee at 110 deg of hip flex; presents with 3+/5 knee ext, 2-/5 left ankle DF and 0/5 right ankle DF, 2-/5 bilateral Hip abd in seated position.    12/6: R: knee 3+/5 ext, 2/5 flexion, left knee 3+/5 extension, 3+/5 flexion, R hip: 2+/5 hip add, 2+/5 hip ABD L hip: 4-/5 hip ABD, 3+/5 hip ADD, 3+/5 hip flexion; 06/06/2022= Patient now presents with 2-/5 right ankle DF/PF;     Time 12     Period Weeks     Status MET    Target Date 03/07/2022         PT LONG TERM GOAL #2    Title Patient will tolerate sitting unsupported demonstrating erect sitting posture for 15 minutes with CGA to demonstrate improved back extensor strength and improved sitting tolerance.     Baseline 01/04/2021- Patient confied to sitting in lift chair or electric power chair with back support and unable to sit upright without physical assistance; 12/12: tolerates <1 minutes upright unsupported sitting. 05/10/2021=static sit with forward trunk lean  in his power wheelchair without back support x approx 3 min. 06/21/2021=Unable to assess today due to patient with acute back pain but on previous visit able to sit x 8 min without back support. 09/15/2021- on last visit- 09/13/2021- patient was able to sit  unsupported x 8 min at edge of mat. 10/13/2023 - Patient was able to sit at edge of mat with varying level of assist today from SBA to min A for a total of 20 min. 12/13/2021= Patient demonstrated unsupported sitting at edge of mat for approx 20 min    Time 12     Period Weeks     Status GOAL MET    Target Date 12/08/21          PT LONG TERM GOAL #3    Title Patient will demonstrate ability to perform static standing in // bars > 2 min with Max Assist  without loss of balance and fair posture for improved overall strength for pre-gait and transfer activities.     Baseline 01/04/2021= Patient current uanble to stand- Dependent on hoyer or sit to stand lift for transfers. 05/10/2021=Not appropriate yet- Currently still dependent with all transfers using hoyer. 06/21/2021= Patient continuing now to focus on LE strengthening to prepare for standing-unable to try today due to acute low back pain-  planning on attempting in new cert period. 09/15/2021- Patient has attempted standing 2x in past two week- max Assist of 2 people - only once was he successful to clearing his bottom from chair - Will continue to be a focus during the new certification. 12/13/2021= Patient has been limited secondary to increased overall low back pain during this certification and will require more time to focus on this goal.  12/6: not assessed this date, will assess at date when 2-3 PTs are present for assistance     Time 12     Period Weeks     Status GOAL not appropriate at this time - may attempt in future once Patient presents with improved overall LE strength.                 PT LONG TERM GOAL #4    Title Pt will improve FOTO score by 10 points or more demonstrating improved perceived  functional ability     Baseline FOTO 7 on 10/17; 03/15/21: FOTO 12; 05/10/2021 06/21/2021= 1; 09/15/2021= 9; 12/13/2021= Will issue next visit 12/6: 4; 06/06/2022= Will assess next visit    Time 12     Period Weeks     Status ONGOING    Target date  11/30/2022         PT LONG TERM GOAL #5    Title Patient will perform sit to stand transfer with appropriate AD and max assist of 2 people with 75% consistency to prepare for pregait activities.     Baseline 09/15/2021= Patient unable to stand well- unable to clear his bottom off chair with Max assist of 2 persons. 12/13/2021- Goal not appropriate to try yet but will keep and roll over to next cert as shift continues to focus on transfers/standing; 4/24= Patient able to perform active ankle DF/PF with right LE and able to raise his knee into seated march and clear floor without physical assist today - as previously unable as well as lift right knee ext to near full ROM to improve strength for eventual standing. 09/07/2022- Goal continues to not be appropriate but patient is now standing some and will keep goal active    Time 12     Period Weeks     Status Goal still not appropriate for now but will keep active for future    Target date         PT LONG TERM GOAL #6  Title Patient will tolerate sitting unsupported demonstrating erect sitting posture for 30 minutes with CGA to demonstrate improved back extensor strength and improved sitting tolerance.   Baseline 12/13/2021= Patient demonstrated unsupported sitting at edge of mat for approx 20 min;  12/29/2021- Patient performed approx 30 min of dynamic sitting activities today. 06/06/2022= Patient demonstrated ability to sit and perform static and dynamic UE/LE movement with only Supervision.   Time 12   Period Weeks   Status GOAL MET           7.  Patient will tolerate 5 minutes or more of standing in sit to stand lift or with max assist +2 in order to indicate improved lower extremity weightbearing tolerance for progression to standing in parallel bars. Baseline: 1 minute on most recent stand 02/21/22; 06/06/2022- Patient did attempt today after complaining of right LE pain last week. He attempted 3 stands using sit to stand lift. - all over 1 min- last one  approx 48 sec- stopped due to fatigue. More erect standing in lift today - still poor gluteal strength but able to activate glutes and extend hips upon command but unable to hold > 5 sec. 07/28/2022- Will attempt standing next visit but patient has been able to stand since last progress note for up to 3 min at a time at his best. 09/07/2022-  attempted standing with max +2 and patient able to stand 15-20 sec so will now  revise goal Goal status: GOAL Revised/ongoing Target date: 11/30/2022   8. Patient will tolerate sitting unsupported demonstrating ability to perform dynamic UE/LE activities for 30 minutes  independently to demonstrate ability to sit at edge of bed to eat or perform some ADL's/exercise for optimal quality of life.             Baseline: 06/06/2022- Patient able to sit and perform some UE/LE exercises but requires CGA at times for safety- he is able to static sit for 30 min with supervision. 07/27/2022= Patient able to now  sit > 30 min with Supervision only - performing static and dynamic activities. Will keep goal active to ensure patient is consistent. 09/07/2022=  Patient able to demonstrate consistent ability to sit at edge of mat during visits             Goal Status: MET             Target date: 08/29/2022        Plan     Clinical Impression Statement Patient presented with great motivation and responded well to treatment- able to stand more erect today with anterior knee blocking. He continues to demo improving strength as seen by ability to raise feet off floor and more range of motion with trunk leans. Pt will continue to benefit from skilled physical therapy intervention to address impairments, improve QOL, and attain therapy goals.    Personal Factors and Comorbidities Comorbidity 3+;Time since onset of injury/illness/exacerbation    Comorbidities CVA, diabetes, Seizures    Examination-Activity Limitations Bathing;Bed Mobility;Bend;Caring for  Others;Carry;Dressing;Hygiene/Grooming;Lift;Locomotion Level;Reach Overhead;Self Feeding;Sit;Squat;Stairs;Stand;Transfers;Toileting    Examination-Participation Restrictions Cleaning;Community Activity;Driving;Laundry;Medication Management;Meal Prep;Occupation;Personal Finances;Shop;Yard Work;Volunteer    Stability/Clinical Decision Making Evolving/Moderate complexity    Rehab Potential Fair    PT Frequency 2x / week    PT Duration 12 weeks    PT Treatment/Interventions ADLs/Self Care Home Management;Cryotherapy;Electrical Stimulation;Moist Heat;Ultrasound;DME Instruction;Gait training;Stair training;Functional mobility training;Therapeutic exercise;Balance training;Patient/family education;Orthotic Fit/Training;Neuromuscular re-education;Wheelchair mobility training;Manual techniques;Passive range of motion;Dry needling;Energy conservation;Taping;Visual/perceptual remediation/compensation;Joint Manipulations    PT Next Visit Plan core strength- sitting at EOM, postural control, sit to stand if able; Continue with progressive LE Strengthening. Stand activities as appropriate.     PT Home Exercise Plan No changes to HEP today    Consulted and Agree with Plan of Care Patient;Family member/caregiver    Family Member Consulted Mom             4:57 PM, 11/02/22 Louis Meckel, PT Physical Therapist - [facility]  Outpatient Physical Therapy- [facility] [phone]     11/02/2022, 4:57 PM

## 2022-11-03 DIAGNOSIS — G4733 Obstructive sleep apnea (adult) (pediatric): Secondary | ICD-10-CM | POA: Diagnosis not present

## 2022-11-07 ENCOUNTER — Ambulatory Visit: Payer: BC Managed Care – PPO | Admitting: Physical Therapy

## 2022-11-07 ENCOUNTER — Ambulatory Visit: Payer: Medicare Other | Admitting: Occupational Therapy

## 2022-11-09 ENCOUNTER — Ambulatory Visit: Payer: BC Managed Care – PPO | Attending: Physician Assistant

## 2022-11-09 ENCOUNTER — Ambulatory Visit: Payer: Medicare Other

## 2022-11-09 DIAGNOSIS — R278 Other lack of coordination: Secondary | ICD-10-CM | POA: Insufficient documentation

## 2022-11-09 DIAGNOSIS — R2681 Unsteadiness on feet: Secondary | ICD-10-CM | POA: Diagnosis not present

## 2022-11-09 DIAGNOSIS — I6930 Unspecified sequelae of cerebral infarction: Secondary | ICD-10-CM | POA: Insufficient documentation

## 2022-11-09 DIAGNOSIS — M6281 Muscle weakness (generalized): Secondary | ICD-10-CM

## 2022-11-09 DIAGNOSIS — R262 Difficulty in walking, not elsewhere classified: Secondary | ICD-10-CM | POA: Diagnosis not present

## 2022-11-09 DIAGNOSIS — R269 Unspecified abnormalities of gait and mobility: Secondary | ICD-10-CM | POA: Insufficient documentation

## 2022-11-09 NOTE — Therapy (Signed)
OUTPATIENT PHYSICAL THERAPY TREATMENT   Patient Name: Paul Hall MRN: [id number] DOB:April 22, 1995, 27 y.o., male Today's Date: 11/09/2022  PCP:  Cindi Carbon PROVIDER:  Lenise Herald, PA-C   PT End of Session - 11/09/22 2023     Visit Number 116    Number of Visits 128    Date for PT Re-Evaluation 11/30/22    Authorization Type [employer]BS COMM [employer]/ [place] Medicaid    Progress Note Due on Visit 120    PT Start Time 1530    PT Stop Time 1610    PT Time Calculation (min) 40 min    Activity Tolerance Patient tolerated treatment well;Patient limited by fatigue    Behavior During Therapy WFL for tasks assessed/performed              Past Medical History:  Diagnosis Date   Diabetes mellitus (HCC)    Hypertension    Stroke [facility])    Past Surgical History:  Procedure Laterality Date   IVC FILTER PLACEMENT ([facility] HX)     LEG SURGERY     PEG TUBE PLACEMENT     TRACHEOSTOMY     Patient Active Problem List   Diagnosis Date Noted   Sepsis due to vancomycin resistant Enterococcus species (HCC) 06/06/2019   SIRS (systemic inflammatory response syndrome) (HCC) 06/05/2019   Acute lower UTI 06/05/2019   VRE (vancomycin-resistant Enterococci) infection 06/05/2019   Anemia 06/05/2019   Skin ulcer of sacrum with necrosis of muscle (HCC)    Urinary retention    Type 2 diabetes mellitus without complication, with long-term current use of insulin (HCC)    Tachycardia    Lower extremity edema    Acute metabolic encephalopathy    Obstructive sleep apnea    Morbid obesity with BMI of 60.0-69.9, adult (HCC)    Goals of care, counseling/discussion    Palliative care encounter    Sepsis (HCC) 04/27/2019   H/O insulin dependent diabetes mellitus 04/27/2019   History of CVA with residual deficit 04/27/2019   Seizure disorder (HCC) 04/27/2019   Decubitus ulcer of sacral region, stage 4 (HCC) 04/27/2019    REFERRING DIAG: Cerebral infarction, unspecified   THERAPY DIAG:  Muscle weakness  (generalized)  Other lack of coordination  History of CVA with residual deficit  Abnormality of gait and mobility  Difficulty in walking, not elsewhere classified  Rationale for Evaluation and Treatment Rehabilitation  PERTINENT HISTORY: Paul Hall is a 26yoM who presents with severe weakness, quadriparesis, altered sensorium, and visual impairment s/p critical illness and prolonged hospitalization. Pt hospitalized in October 2020 with ARDS 2/2 COVID19 infection. Pt sustained a complex and lengthy hospitalization which included tracheostomy, prolonged sedation, ECMO. In this period pt sustained CVA and SDH. Pt has now been liberated from tracheostomy and G-tube. Pt has since been hospitalized for wound infection and UTI. Pt lives with parents at home, has hospital bed and left chair, hoyer lift transfers, and power WC for mobility needs. Pt needs heavy physical assistance with ADL 2/2 BUE contractures and motor dysfunction   PRECAUTIONS: Fall  SUBJECTIVE:  No updates since prior visit. Pt reports good incorporation of sit-ups exercise at home.   PAIN:  Are you having pain? No   TODAY'S TREATMENT:  - 11/09/22   -Short-sitting in powerchair: hip/knee/ankle flexion stretch 2x60sec bilat   - Trunk fleixon stretch 2x45sec  -LAQ 1x15 bilat (AA/ROM),  -knee flexion AR/ROM 1x15 bilat -LAQ 1x15 bilat (AA/ROM),  -knee flexion AR/ROM 1x15 bilat  -reclined sit-up 1x12 (seat parallel to  floor, chair back at 30 degrees recliner)  -seated trunk extension 0 to 30 degrees 1x12 @ 12.5lb, cable column #11              -reclined sit-up 1x12 (seat parallel to floor, chair back at 30 degrees recliner)  -seated trunk extension 0 to 30 degrees 1x12 @ 12.5lb, cable column #11  -reclined sit-up 1x12 (seat parallel to floor, chair back at 30 degrees recliner)  -seated trunk extension 0 to 30 degrees 1x12 @ 12.5lb, cable column #11     PATIENT EDUCATION: Education details: Progression of intensity  at home with trunk strength exercises and how to grade intensity using a goniometer bubble app.   HOME EXERCISE PROGRAM:  Access Code: [email or web address] URL: [email or web address] Date: 03/23/2022 Prepared by: Maureen Ralphs  Exercises - Supine Bridge  - 3 x weekly - 3 sets - 10 reps - 2 hold - Supine Gluteal Sets  - 3 x weekly - 3 sets - 10 reps - 5 sec hold - Supine Quad Set  - 1 x daily - 3 x weekly - 3 sets - 10 reps - 5 hold - Seated Long Arc Quad  - 1 x daily - 7 x weekly - 3 sets - 10 reps - Seated Hip Adduction Squeeze with Ball  - 1 x daily - 3 x weekly - 3 sets - 10 reps - 5 hold - Seated Hip Abduction  - 1 x daily - 3 x weekly - 3 sets - 10 reps - 2 hold    PT Short Term Goals -       PT SHORT TERM GOAL #1   Title Pt will be independent with HEP in order to improve strength and balance in order to decrease fall risk and improve function at home and work.    Baseline 01/04/2021= No formal HEP in place; 12/12 no HEP in place; 05/10/2021-Patient and his father were able to report compliance with curent HEP consisting of mostly seated/reclined LE strengthening. Both verbalize no questions at this time.    Time 6    Period Weeks    Status Achieved    Target Date 02/15/21            PT LONG TERM GOAL #1     Title Patient will increase BLE gross strength by 1/2 muscle grade to improve functional strength for improved independence with potential gait, increased standing tolerance and increased ADL ability.     Baseline 01/04/2021- Patient presents with 1/5 to 3-/5 B LE strength with MMT; 12/12: goal partially met for Left knee/hip; 05/10/2021= 2-/5 bilateal Hip flex; 3+/5 bilateral Knee ext; 06/21/2021= Patient presents with 2-/5 bilateral Hip flex; 3+/5 bilateral knee ext/flex; 2-/5 left ankle DF; 0/5 right ankle- and able to increase reps and resistance with LE's. 09/15/2021- Patient technically presents with 2-/5 B hip flex/abd/add - but he is able to raise his hip up to  approx 100 deg which has improved. 3+/5 Bilateral knee ext, 2-/5 left ankle and 0/5 right ankle.  12/08/2021= Patient able to lift left knee at 110 deg of hip flex; presents with 3+/5 knee ext, 2-/5 left ankle DF and 0/5 right ankle DF, 2-/5 bilateral Hip abd in seated position.    12/6: R: knee 3+/5 ext, 2/5 flexion, left knee 3+/5 extension, 3+/5 flexion, R hip: 2+/5 hip add, 2+/5 hip ABD L hip: 4-/5 hip ABD, 3+/5 hip ADD, 3+/5 hip flexion; 06/06/2022= Patient now presents with 2-/5 right ankle DF/PF;  Time 12     Period Weeks     Status MET    Target Date 03/07/2022         PT LONG TERM GOAL #2    Title Patient will tolerate sitting unsupported demonstrating erect sitting posture for 15 minutes with CGA to demonstrate improved back extensor strength and improved sitting tolerance.     Baseline 01/04/2021- Patient confied to sitting in lift chair or electric power chair with back support and unable to sit upright without physical assistance; 12/12: tolerates <1 minutes upright unsupported sitting. 05/10/2021=static sit with forward trunk lean  in his power wheelchair without back support x approx 3 min. 06/21/2021=Unable to assess today due to patient with acute back pain but on previous visit able to sit x 8 min without back support. 09/15/2021- on last visit- 09/13/2021- patient was able to sit unsupported x 8 min at edge of mat. 10/13/2023 - Patient was able to sit at edge of mat with varying level of assist today from SBA to min A for a total of 20 min. 12/13/2021= Patient demonstrated unsupported sitting at edge of mat for approx 20 min    Time 12     Period Weeks     Status GOAL MET    Target Date 12/08/21          PT LONG TERM GOAL #3    Title Patient will demonstrate ability to perform static standing in // bars > 2 min with Max Assist  without loss of balance and fair posture for improved overall strength for pre-gait and transfer activities.     Baseline 01/04/2021= Patient current uanble to  stand- Dependent on hoyer or sit to stand lift for transfers. 05/10/2021=Not appropriate yet- Currently still dependent with all transfers using hoyer. 06/21/2021= Patient continuing now to focus on LE strengthening to prepare for standing-unable to try today due to acute low back pain-  planning on attempting in new cert period. 09/15/2021- Patient has attempted standing 2x in past two week- max Assist of 2 people - only once was he successful to clearing his bottom from chair - Will continue to be a focus during the new certification. 12/13/2021= Patient has been limited secondary to increased overall low back pain during this certification and will require more time to focus on this goal.  12/6: not assessed this date, will assess at date when 2-3 PTs are present for assistance     Time 12     Period Weeks     Status GOAL not appropriate at this time - may attempt in future once Patient presents with improved overall LE strength.                 PT LONG TERM GOAL #4    Title Pt will improve FOTO score by 10 points or more demonstrating improved perceived functional ability     Baseline FOTO 7 on 10/17; 03/15/21: FOTO 12; 05/10/2021 06/21/2021= 1; 09/15/2021= 9; 12/13/2021= Will issue next visit 12/6: 4; 06/06/2022= Will assess next visit    Time 12     Period Weeks     Status ONGOING    Target date 11/30/2022         PT LONG TERM GOAL #5    Title Patient will perform sit to stand transfer with appropriate AD and max assist of 2 people with 75% consistency to prepare for pregait activities.     Baseline 09/15/2021= Patient unable to stand well- unable to clear his  bottom off chair with Max assist of 2 persons. 12/13/2021- Goal not appropriate to try yet but will keep and roll over to next cert as shift continues to focus on transfers/standing; 4/24= Patient able to perform active ankle DF/PF with right LE and able to raise his knee into seated march and clear floor without physical assist today - as previously  unable as well as lift right knee ext to near full ROM to improve strength for eventual standing. 09/07/2022- Goal continues to not be appropriate but patient is now standing some and will keep goal active    Time 12     Period Weeks     Status Goal still not appropriate for now but will keep active for future    Target date         PT LONG TERM GOAL #6  Title Patient will tolerate sitting unsupported demonstrating erect sitting posture for 30 minutes with CGA to demonstrate improved back extensor strength and improved sitting tolerance.   Baseline 12/13/2021= Patient demonstrated unsupported sitting at edge of mat for approx 20 min;  12/29/2021- Patient performed approx 30 min of dynamic sitting activities today. 06/06/2022= Patient demonstrated ability to sit and perform static and dynamic UE/LE movement with only Supervision.   Time 12   Period Weeks   Status GOAL MET           7.  Patient will tolerate 5 minutes or more of standing in sit to stand lift or with max assist +2 in order to indicate improved lower extremity weightbearing tolerance for progression to standing in parallel bars. Baseline: 1 minute on most recent stand 02/21/22; 06/06/2022- Patient did attempt today after complaining of right LE pain last week. He attempted 3 stands using sit to stand lift. - all over 1 min- last one approx 48 sec- stopped due to fatigue. More erect standing in lift today - still poor gluteal strength but able to activate glutes and extend hips upon command but unable to hold > 5 sec. 07/28/2022- Will attempt standing next visit but patient has been able to stand since last progress note for up to 3 min at a time at his best. 09/07/2022-  attempted standing with max +2 and patient able to stand 15-20 sec so will now  revise goal Goal status: GOAL Revised/ongoing Target date: 11/30/2022   8. Patient will tolerate sitting unsupported demonstrating ability to perform dynamic UE/LE activities for 30 minutes   independently to demonstrate ability to sit at edge of bed to eat or perform some ADL's/exercise for optimal quality of life.             Baseline: 06/06/2022- Patient able to sit and perform some UE/LE exercises but requires CGA at times for safety- he is able to static sit for 30 min with supervision. 07/27/2022= Patient able to now sit > 30 min with Supervision only - performing static and dynamic activities. Will keep goal active to ensure patient is consistent. 09/07/2022=  Patient able to demonstrate consistent ability to sit at edge of mat during visits             Goal Status: MET             Target date: 08/29/2022        Plan     Clinical Impression Statement Patient presented with great motivation and responded well to treatment. Successful incorporation of cable machine for hip/back extension strength training. Pt will continue to benefit from skilled  physical therapy intervention to address impairments, improve QOL, and attain therapy goals.    Personal Factors and Comorbidities Comorbidity 3+;Time since onset of injury/illness/exacerbation    Comorbidities CVA, diabetes, Seizures    Examination-Activity Limitations Bathing;Bed Mobility;Bend;Caring for Others;Carry;Dressing;Hygiene/Grooming;Lift;Locomotion Level;Reach Overhead;Self Feeding;Sit;Squat;Stairs;Stand;Transfers;Toileting    Examination-Participation Restrictions Cleaning;Community Activity;Driving;Laundry;Medication Management;Meal Prep;Occupation;Personal Finances;Shop;Yard Work;Volunteer    Stability/Clinical Decision Making Evolving/Moderate complexity    Rehab Potential Fair    PT Frequency 2x / week    PT Duration 12 weeks    PT Treatment/Interventions ADLs/Self Care Home Management;Cryotherapy;Electrical Stimulation;Moist Heat;Ultrasound;DME Instruction;Gait training;Stair training;Functional mobility training;Therapeutic exercise;Balance training;Patient/family education;Orthotic Fit/Training;Neuromuscular  re-education;Wheelchair mobility training;Manual techniques;Passive range of motion;Dry needling;Energy conservation;Taping;Visual/perceptual remediation/compensation;Joint Manipulations    PT Next Visit Plan core strength/motor control in short-sitting, sit to stand if able; Continue with progressive LE Strengthening. Stand activities as appropriate.     PT Home Exercise Plan No changes to HEP today    Consulted and Agree with Plan of Care Patient;Family member/caregiver    Family Member Consulted Dad            8:24 PM, 11/09/22 Rosamaria Lints, PT, DPT Physical Therapist - [facility]  Outpatient Physical Therapy- [facility] [phone]      11/09/2022, 8:24 PM

## 2022-11-10 NOTE — Therapy (Addendum)
OCCUPATIONAL THERAPY NEURO TREATMENT NOTE  Patient Name: Paul Hall MRN: [id number] DOB:July 27, 1995, 27 y.o., male Today's Date: 11/10/2022  PCP: Dr. Sherwood Gambler REFERRING PROVIDER: Dr. Sherwood Gambler   OT End of Session - 11/10/22 0814     Visit Number [id number]    Number of Visits 60    Date for OT Re-Evaluation 12/19/22    OT Start Time 0415    OT Stop Time 0500    OT Time Calculation (min) 45 min    Equipment Utilized During Treatment tilt in space power wc    Activity Tolerance Patient tolerated treatment well    Behavior During Therapy WFL for tasks assessed/performed                 Past Medical History:  Diagnosis Date   Diabetes mellitus (HCC)    Hypertension    Stroke [facility])    Past Surgical History:  Procedure Laterality Date   IVC FILTER PLACEMENT ([facility] HX)     LEG SURGERY     PEG TUBE PLACEMENT     TRACHEOSTOMY     Patient Active Problem List   Diagnosis Date Noted   Sepsis due to vancomycin resistant Enterococcus species (HCC) 06/06/2019   SIRS (systemic inflammatory response syndrome) (HCC) 06/05/2019   Acute lower UTI 06/05/2019   VRE (vancomycin-resistant Enterococci) infection 06/05/2019   Anemia 06/05/2019   Skin ulcer of sacrum with necrosis of muscle (HCC)    Urinary retention    Type 2 diabetes mellitus without complication, with long-term current use of insulin (HCC)    Tachycardia    Lower extremity edema    Acute metabolic encephalopathy    Obstructive sleep apnea    Morbid obesity with BMI of 60.0-69.9, adult (HCC)    Goals of care, counseling/discussion    Palliative care encounter    Sepsis (HCC) 04/27/2019   H/O insulin dependent diabetes mellitus 04/27/2019   History of CVA with residual deficit 04/27/2019   Seizure disorder (HCC) 04/27/2019   Decubitus ulcer of sacral region, stage 4 (HCC) 04/27/2019   ONSET DATE: 01/2019  REFERRING DIAG: CVA/COVID-19  THERAPY DIAG:  Muscle weakness (generalized)  Other lack of  coordination  Rationale for Evaluation and Treatment Rehabilitation  SUBJECTIVE:   SUBJECTIVE STATEMENT:  Pt. Reports he is doing well today and looking forward to the wrestling event he is going to this weekend with his family.  Pt accompanied by: self and family member  PERTINENT HISTORY:  Pt. is a 27 y.o. male who was diagnosed with COVID-19, and CVA with resultant quadriplegia. Pt. Was then hospitalized with VRE UTI. PMHx includes: urinary retention, seizure disorder, obstructive sleep disorder, DM Type II, Morbid obesity.   PRECAUTIONS: None  WEIGHT BEARING RESTRICTIONS No  PAIN:  Are you having pain? No pain reported  FALLS: Has patient fallen in last 6 months? No  LIVING ENVIRONMENT: Lives with: lives with their family Lives in: House/apartment Stairs: No Level Entry Has following equipment at home: Wheelchair (power) and hoyer lift, sit to stand lift  PLOF: Independent  PATIENT GOALS: To be able to engage in more daily care tasks.  OBJECTIVE:   HAND DOMINANCE: Right  ADLs:  Transfers/ambulation related to ADLs: Eating: Pt. reports being able to hold standard utensils, and is starting to engage more in self-feeding tasks, hand to mouth patterns. Pt. Reports that he does as much as he can with the task, and family assists  with the remainder of the task. Grooming: Pt. Is able to initiate  holding an electric toothbrush, and brush his teeth. Family assists LB Dressing: Total Assist UE dressing: Pt. is now able to reach up to actively assist with grasping , and pulling his gown down. Toileting:  Total Assist Bathing: MaxA UB, Total assist LB Tub Shower transfers: N/A Equipment: See above    IADLs: Shopping: Relies on family to assist Light housekeeping: Total Assist Meal Prep: Total Assist Community mobility:   Medication management:  Total Assist  Financial management: N/A Handwriting: Not legible: Pt. Is able to hold a pen with the left hand, and initiate  marking the page. Pt.'s eye glasses were not available  MOBILITY STATUS:  Power w/c  POSTURE COMMENTS:  Pt. Requires position changes in his power w/c  ACTIVITY TOLERANCE: Activity tolerance:  Fair  FUNCTIONAL OUTCOME MEASURES: FOTO: 40  TR score: 45  05/25/2022:   FOTO: 50 TR score: 45  07/20/22: FOTO 55  UPPER EXTREMITY ROM     Active ROM Right eval Right 03/21/2022 Right 05/25/2022 R  07/20/22 Right  07/25/22 Right 08/15/2022 Right 09/26/2022 Left eval Left  03/21/2022 Left 05/25/2022 L  07/20/22 Left 07/25/2022 Left  08/15/2022 Left  09/26/2022  Shoulder flexion 106 scaption 105  Scaption 62 flexion 110 scaption 100  105 105 119 123 125 130  136 138  Shoulder abduction 114 90 97 100  105 117 110 115 115 115  118 121  Shoulder adduction                Shoulder extension                Shoulder internal rotation                Shoulder external rotation                Elbow flexion 120(130) 145 145 140  144 140 135(135) 145 145   145   Elbow extension -45(-35) -22(-35) -28(-22)  -32(-21) -32(-28) -28(-26) -27(-18) -21(-20) -20(-16)  -25(-10) -23(-10) -21(-10)  Wrist flexion                Wrist extension -30(10) -25(10) -10(30)  -10(20) -10(25) -20(40) 10(50) 19(50) 28(50)  30(50) 35(55) 36(55)  Wrist ulnar deviation                Wrist radial deviation                Wrist pronation                Wrist supination   Limited by flexor tone       Limited by flexor tone      (Blank rows = not tested)  UPPER EXTREMITY MMT:     Right eval Right 03/21/2022 Right  05/25/2022 R 07/20/22 Right 08/25/2022 Right 09/26/2022 Left eval Left 03/21/2022 Left  05/25/2022 L 07/20/22 Left  08/15/2022 Left 09/26/2022  Shoulder flexion 3-/5 scaption 3-/5 3-/5 3+/5 3+/5 3+/5 3-/5 3/5 3+/5 4+/5 4+/5 4+/5  Shoulder abduction 3-/5 3-/5 3-/5 4/5 4/5 4/5 3-/5 3-/5 4-/5 4+/5 4+/5 4+5  Shoulder adduction              Shoulder extension              Shoulder internal rotation               Shoulder external rotation              Elbow flexion 3/5 3/5 3+/5 4/5 4+/5 4+/5 3/5 3+/5  4-/5 4+/5 5/5 5/5  Elbow extension 2-/5 2/5 3-/5 4+/5 4+/5 4+/5 2-/5 2/5  4+/5 4+/5 4+/5  Wrist flexion              Wrist extension 2-/5 2-/5 2/5 2/5 2/5 2/5 2/5 2+/5 3-/5  3-/5 3-/5  Wrist ulnar deviation              Wrist radial deviation              Wrist pronation              Wrist supination              (Blank rows = not tested)   HAND FUNCTION:  Grip strength: Right: 0 lbs; Left: 0 lbs and Lateral pinch: Right: 5 lbs, Left: 2 lbs  03/21/2022: Lateral pinch: Right: 3.5 lbs, Left: 2 lbs  07/20/22: Grip strength: Right: 3 lbs; Left: 4 lbs and Lateral pinch: Right: 5 lbs, Left: 3 lbs  08/15/2022:  Grip strength: Right: 0 lbs; Left: 5 lbs and Lateral pinch: Right: 2 lbs, Left: 4 lbs  09/26/2022  Grip strength: Right: 0 lbs; Left: 8 lbs and Lateral pinch: Right: 2 lbs, Left: 5 lbs   Bilateral digit PIP/DIP flexion contractures with MP hyperextension with attempts for AROM. Pt. is able to tolerate AROM to the bilateral digits at the initial evaluation however, has a history of pain in the digits.  COORDINATION: Eval: Pt. is unable to grasp 9-hole test pegs. Pt. is able to initiate grasping larger pegs, and is able to hold a pen in the left hand.  07/20/22: 2 min 36 seconds to remove 9 pegs from 9 hole peg test - cues to locate pegs 2/2 low vision. Pt. is able to initiate grasping larger pegs on R hand and is able to hold a pen in the left hand.  08/15/2022:      Vision, and sensation limiting accuracy of 9 hole peg test results. Pt. Was able to grasp and remove vertical pegs intermittently with cues.    SENSATION: Light touch: Impaired   EDEMA:  N/A  MUSCLE TONE: BUE flexor Spasticity  COGNITION Overall cognitive status: Continue to assess in functional context  VISION:   Subjective report: Pt. was not wearing glasses at the time of the initial eval.  Baseline vision:  Vision is very limited. Wears glasses all the time Visual history: History of impaired vision following CVA. Pt. Has received treatment through the [facility] low vision rehabilitation program.   VISION ASSESSMENT: Impaired To be further assessed in functional context  PERCEPTION: Impaired   PRAXIS: Impaired: motor planning  OBSERVATIONS:  Pt reports being on Tramadol   TODAY'S TREATMENT  Self Care/Home Management:  Pt. worked on bilateral hand [facility] skills grasping sheets while alternating bilateral hands to intentionally pull it up over him. Pt. worked on removing the sheet/pulling it down using bilateral hand [facility] skills to mimic taking his gown off.  Pt. Worked on throwing the sheet off of his lap x 2 reps. Pt. Worked on self feeding using a standard fork and universal cuff placed on his L hand. Pt. Was educated on proper positioning of the universal cuff and fork. Pt. Worked on stabbing putty with the fork followed by hand to mouth patterns to simulate self feeding.   PATIENT EDUCATION: Education details: Doctor, general practice and fork positioning for self feeding Person educated: Patient and Parent Education method: Explanation, Demonstration, Tactile cues, and Verbal cues Education comprehension: verbalized understanding, returned  demonstration, verbal cues required, and needs further education  HOME EXERCISE PROGRAM:  Continue ongoing assessment, and continue to provide as needed.   GOALS: Goals reviewed with patient? Yes  SHORT TERM GOALS: Target date: 11/07/2022   To assess splint fit, and make appropriate adjustments to promote good skin integrity through the palmar surface of the bilateral hands.  Baseline: 05/25/22: Goal currently met, however ongoing as needs to assess splint fit arise. 03/23/2022: Pt. is wearing splints a couple of hours at night bilateral resting hand splints. 03/21/2022: Pt. is wearing splints a couple of hours at night bilateral resting hand splints. Goal status:  Continue ongoing assessment as requested, and as needed   LONG TERM GOALS: Target date: 12/19/2022    FOTO score will Improve by 2 points for Pt. perceived improvement with the assessment specific ADL/IADL tasks.  Baseline: 09/27/2022: 51 07/20/2022: FOTO 55 05/25/2022: FOTO score: 50,  TR score: 45 Eval: FOTO score: 40,  TR score: 45 Goal status: Achieved   2.   Pt. will independently perform oral care for 100% of the task after complete set-up. Baseline: 09/26/2022: Completes 90% of the task, proximal assist at times required at the elbow.  08/15/2022: Completes 90% of the task, proximal assist at times required at the elbow. 07/20/22: completes 90% of task, limited by shoulder flexion. 05/25/2022:  Pt. Is able to initiate and perform oral care for approximately 90% of the task. Complete set-up required. Assi needed only for the very back teeth. 03/23/2022: Pt. Is able to initiate and perform oral care for approximately 75% of the task. Pt. Requires assist at proximally at the elbow for through oral care. 03/21/2022: Pt. Is able to initiate and perform oral care for approximately 75% of the task. Pt. Requires assist at proximally at the elbow for through oral care. Eval: Pt. is able to initiate using an electric toothbrush. Pt. requires assist for set-up, and assist for thoroughness, and as he Pt. fatigues. Goal status: Ongoing  3.  Pt. Will be independent with self-feeding for 100% of the meal after complete set-up Baseline:09/26/2022: 90% using the spoon, assit at time with the forearm motion, and grip on the utensils. 100% for finger foods.  08/15/2022:  90% using the spoon, assit at time with the forearm motion, and grip on the utensils. 100% for finger foods-independent with set-up- unsupervised.  07/20/22: self-feeds cereal using spoon 90% of task. 05/25/2022: Pt. Is able to use a spoon to scoop cereal when feeding himself cereal 85% of the time. Pt. Is able to feed himself snack/finger foods 100% of the  time. Pt. Continues to work on consistency of  stabilizing a cup/mug when drinking. Pt. Is able to grasp a water bottle with assist initially, with assist tapering off as he drinks.03/23/2022: Pt. Is able to perform scooping cereal for 75% of the time. Pt. required assist, and support at the left elbow, and Pt. Presents with limited forearm supination when using the spoon, and bringing it towards his mouth. Pt. Is able to use a fork to spear items, and perform the hand to mouth pattern.  03/21/2022: Pt. Is able to perform scooping cereal for 75% of the time. Pt. required assist, and support at the left elbow, and Pt. presents with limited forearm supination when using the spoon, and bringing it towards his mouth. Pt. Is able to use a fork to spear items, and perform the hand to mouth pattern.  Eval: Pt. is able to hold standard standard utensils. Pt. Performs as  much of the task as he, can and has assistance for the remainder. Goal status: Ongoing  4.  Pt. Will improve grasp patterns and consistently grasp 1/4" objects for ADL, and IADL tasks.  Baseline: 09/26/2022: Continues to grasp 1/4" pegs with min a + visual cues, consistently grasping 1/2" objects with visual cues. 08/15/2022: grasps 1/4" pegs with min a + visual cues, consistently grasping 1/2" objects with visual cues. 07/20/22: grasps 1/4" pegs with min a + visual cues, consistently grasping 1/2" objects with visual cues. 05/25/2022: Pt. Is working on improving consistency of grasping 1/2" objects with visual cues.  03/23/2022: Pt. Is able to grasp 1" objects consistently,and continues to work on the hand patterns needed to grasp 1/2" objects.03/21/2022: Pt. Is able to grasp 1" objects consistently,and continues to work on the hand patterns needed to grasp 1/2" objects. Eval: Pt. is able to grasp 1" objects intermittently using a lateral grasp pattern. Goal status: Progressing; goal revised to 1/4" items  5.  Pt. will independently write his name  legibly with letter sizes under 1". Baseline: 09/26/2022: Pt. Continues to be able to write name with smaller letter size. Pt. Is signing name with a computer styus. Pt. Requires visual cues, and assist. 08/15/2022: Pt. Is able to write name with smaller letter size. Pt. Is signing name with a computer styus. Pt. Requires visual cues, and assist. 07/20/22: stabilizing assist to write name with 75% legibility with 2" letters. 05/25/2022: Pt. to continue to work towards formulating grasp patterns in preparation for grasping a large width pen. Pt. Requires visual cues. 03/23/2022: Pt. Is able to write his name with modA, however has difficulty with formulating letter sizes less than 2" in size with 50% legibility for the 3 letters of his name.03/21/2022: Pt. Is able to write his name with modA, however has difficulty with formulating letter sizes less than 2" in size with 50% legibility for the 3 letters of his name. Eval: Pt is able to hold a thin marker with his left hand, and formulate a line, and initiate a circular pattern (Pt. without glasses today) Goal status: Progressing; Goal revised for more control with smaller writing size  6. Pt. Will reach up to comb/brush his hair  with minA.  Baseline: 09/26/2022: Pt. Is able to use a hair pick for the left side of his head using his left hand. Pt. Presents with difficulty reaching to the right side of his head. 5/13/202: 75% using a hair pick, assist to the right side of the head. 07/20/22: reaches 75% of head, assist for far R side of head, fatigues quickly. 05/25/2022: Pt. Is able to reach up with the left hand to the left side, top, and back of his head. 03/23/2022: Pt. is now able to more consistently initiate reaching up to his head with his left hand in preparation for haircare12/18/2023: Pt. is now able to more consistently initiate reaching up to his head with his left hand in preparation for haircare. Eval: Pt. is able to initiate reaching up for hair care with  a long handled brush, however is unable to sustain UE's in elevation to perform the task.     Goal status: Ongoing  7. Pt. Will independently navigate the w/c through his environment with minA with visual scanning, and hand placement on the controls.  Baseline: 09/26/2022: Pt. Is now navigating his w/c ouside the home, and around his block. 08/15/2022: Pt. Requires minA to setup hand on controls and MIN cues to navigate the w/c  in wide spaces, requires MIN - MOD cues to navigate the w/c through more narrow doorways, and tighter turns - varies based on good vs bad vision days.07/20/22: Pt. Requires minA to setup hand on controls and MIN cues to navigate the w/c in wide spaces, requires MIN - MOD cues to navigate the w/c through more narrow doorways, and tighter turns - varies based on good vs bad vision days. 05/25/2022: Pt. Requires minA  and  cues to navigate the w/c in wide spaces, and requires Mod cues to navigate the w/c through more narrow doorways, and tighter turns. Pt. Requires max cues for scanning through the environment, and moderate cues for hand placement on the controls.     Goal status: Ongoing    8. Pt. Will improve bilateral grip strength to be able to independently grasp, and pull up blankets, and linens.   Baseline: 09/26/2022: R: 0  L: 8# Pt. is attempting to grasp, and pull blankets up more, using mostly the left hand. 08/15/2022: Pt. has difficulty securely holding, and pulling up blankets, and linens.    Goal status: Ongoing  9. Pt. will consistently actively control the releasing  of blankets, covers, and linens from his hands once they are in the desired position over him. Baseline: 09/26/2022: Pt. Is improving with actively releasing blankets form his hands. 08/15/2022: Pt. has difficulty with controlled releasing of blankets/linens from his grasp.     Goal status: Ongoing    10. Pt. Will improve bilateral UE strength by 2 MM grades to assist with ADLs, and IADLs  Baseline: Right:  shoulder flexion: 3+/5, abduction: 4/5, elbow flexion: 4+/5 , extension: 4+/5 wrist extension: 2/5; Left: shoulder flexion: 4+/5, abduction: 4+/5, elbow flexion: 5/5 , extension: 4+/5 wrist extension: 3-/5   ASSESSMENT: CLINICAL IMPRESSION: Pt. Was able to alternate bilateral hand use to manipulate sheet, reposition sheet, as well as pull it up, and down and throw it off of his lap.  Pt. is improving with, and continues to work on pulling sheets up over him when in the recliner chair or bed. Pt. Reported at the beginning of the session that he continues to eat mostly finger foods. Pt. Was educated on benefits of using universal cuff. Pt. Was able to use his L hand with universal cuff to stab the fork into the theraputty followed by hand to mouth patterns.  Pt. required increased verbal cues to help locate the putty on the table due to visual deficits. Pt. Required increased verbal cues to ensure thumb placement was in the proper position on the fork. Towards the end of the session, Pts. Parent reports that Pt. sometimes will use a normal fork however parent has to constantly reposition utensil within his hand. Pt. Was educated on the benefits of using the universal cuff to increase engagement and independence with self feeding. Pt. continues to work on improving BUE functioning, ROM, strength, motor control, [facility] skills, and visual compensatory strategies to be able to independently pull up, and release blankets/covers, improve writing legibility, improve self-care including oral care, and utensil use during self-feeding tasks, and maximize independence with ADLs, and IADLs.     PERFORMANCE DEFICITS in functional skills including ADLs, IADLs, coordination, dexterity, proprioception, ROM, strength, pain, FMC, GMC, decreased knowledge of use of DME, and UE functional use, cognitive skills including safety awareness, and psychosocial skills including coping strategies, environmental adaptation, habits, and  routines and behaviors.   IMPAIRMENTS are limiting patient from ADLs, IADLs, leisure, and social participation.   COMORBIDITIES  may have co-morbidities  that affects occupational performance. Patient will benefit from skilled OT to address above impairments and improve overall function.  MODIFICATION OR ASSISTANCE TO COMPLETE EVALUATION: Maximum or significant modification of tasks or assist is necessary to complete an evaluation.  OT OCCUPATIONAL PROFILE AND HISTORY: Comprehensive assessment: Review of records and extensive additional review of physical, cognitive, psychosocial history related to current functional performance.  CLINICAL DECISION MAKING: High - multiple treatment options, significant modification of task necessary  REHAB POTENTIAL: Fair    EVALUATION COMPLEXITY: High    PLAN: OT FREQUENCY: 2 x's a week  OT DURATION:12 weeks  PLANNED INTERVENTIONS: self care/ADL training, therapeutic exercise, therapeutic activity, neuromuscular re-education, manual therapy, passive range of motion, patient/family education, and cognitive remediation/compensation RECOMMENDED OTHER SERVICES: PT  CONSULTED AND AGREED WITH PLAN OF CARE: Patient and family member/caregiver  PLAN FOR NEXT SESSION: Initiate treatment  Herma Carson OTS,  11/10/2022, 8:45 AM  This entire session was performed under direct supervision and direction of a licensed therapist/therapist assistant . I have personally read, edited and approve of the note as written.  Danelle Earthly, MS, OTR/L

## 2022-11-14 ENCOUNTER — Encounter: Payer: Self-pay | Admitting: Occupational Therapy

## 2022-11-14 ENCOUNTER — Ambulatory Visit: Payer: Medicare Other | Admitting: Occupational Therapy

## 2022-11-14 ENCOUNTER — Ambulatory Visit: Payer: Medicare Other

## 2022-11-14 DIAGNOSIS — R2681 Unsteadiness on feet: Secondary | ICD-10-CM

## 2022-11-14 DIAGNOSIS — R278 Other lack of coordination: Secondary | ICD-10-CM | POA: Diagnosis not present

## 2022-11-14 DIAGNOSIS — R262 Difficulty in walking, not elsewhere classified: Secondary | ICD-10-CM | POA: Diagnosis not present

## 2022-11-14 DIAGNOSIS — R269 Unspecified abnormalities of gait and mobility: Secondary | ICD-10-CM

## 2022-11-14 DIAGNOSIS — M6281 Muscle weakness (generalized): Secondary | ICD-10-CM

## 2022-11-14 DIAGNOSIS — I6930 Unspecified sequelae of cerebral infarction: Secondary | ICD-10-CM

## 2022-11-14 NOTE — Therapy (Signed)
OCCUPATIONAL THERAPY NEURO TREATMENT NOTE  Patient Name: Paul Hall MRN: [id number] DOB:15-Nov-1995, 27 y.o., male Today's Date: 11/14/2022  PCP: Dr. Sherwood Gambler REFERRING PROVIDER: Dr. Sherwood Gambler   OT End of Session - 11/14/22 1617     Visit Number 49    Number of Visits 60    Date for OT Re-Evaluation 12/19/22    Authorization Type 09/26/2022    OT Start Time 1620    OT Stop Time 1700    OT Time Calculation (min) 40 min    Equipment Utilized During Treatment tilt in space power wc    Activity Tolerance Patient tolerated treatment well    Behavior During Therapy WFL for tasks assessed/performed                 Past Medical History:  Diagnosis Date   Diabetes mellitus (HCC)    Hypertension    Stroke [facility])    Past Surgical History:  Procedure Laterality Date   IVC FILTER PLACEMENT ([facility] HX)     LEG SURGERY     PEG TUBE PLACEMENT     TRACHEOSTOMY     Patient Active Problem List   Diagnosis Date Noted   Sepsis due to vancomycin resistant Enterococcus species (HCC) 06/06/2019   SIRS (systemic inflammatory response syndrome) (HCC) 06/05/2019   Acute lower UTI 06/05/2019   VRE (vancomycin-resistant Enterococci) infection 06/05/2019   Anemia 06/05/2019   Skin ulcer of sacrum with necrosis of muscle (HCC)    Urinary retention    Type 2 diabetes mellitus without complication, with long-term current use of insulin (HCC)    Tachycardia    Lower extremity edema    Acute metabolic encephalopathy    Obstructive sleep apnea    Morbid obesity with BMI of 60.0-69.9, adult (HCC)    Goals of care, counseling/discussion    Palliative care encounter    Sepsis (HCC) 04/27/2019   H/O insulin dependent diabetes mellitus 04/27/2019   History of CVA with residual deficit 04/27/2019   Seizure disorder (HCC) 04/27/2019   Decubitus ulcer of sacral region, stage 4 (HCC) 04/27/2019   ONSET DATE: 01/2019  REFERRING DIAG: CVA/COVID-19  THERAPY DIAG:  Muscle weakness  (generalized)  Other lack of coordination  History of CVA with residual deficit  Rationale for Evaluation and Treatment Rehabilitation  SUBJECTIVE:   SUBJECTIVE STATEMENT:  Pt. Reports he is doing well and had a fantastic time at his wrestling event this weekend.  Pt accompanied by: self and family member  PERTINENT HISTORY:  Pt. is a 27 y.o. male who was diagnosed with COVID-19, and CVA with resultant quadriplegia. Pt. Was then hospitalized with VRE UTI. PMHx includes: urinary retention, seizure disorder, obstructive sleep disorder, DM Type II, Morbid obesity.   PRECAUTIONS: None  WEIGHT BEARING RESTRICTIONS No  PAIN:  Are you having pain? No pain reported  FALLS: Has patient fallen in last 6 months? No  LIVING ENVIRONMENT: Lives with: lives with their family Lives in: House/apartment Stairs: No Level Entry Has following equipment at home: Wheelchair (power) and hoyer lift, sit to stand lift  PLOF: Independent  PATIENT GOALS: To be able to engage in more daily care tasks.  OBJECTIVE:   HAND DOMINANCE: Right  ADLs:  Transfers/ambulation related to ADLs: Eating: Pt. reports being able to hold standard utensils, and is starting to engage more in self-feeding tasks, hand to mouth patterns. Pt. Reports that he does as much as he can with the task, and family assists  with the remainder of the  task. Grooming: Pt. Is able to initiate holding an electric toothbrush, and brush his teeth. Family assists LB Dressing: Total Assist UE dressing: Pt. is now able to reach up to actively assist with grasping , and pulling his gown down. Toileting:  Total Assist Bathing: MaxA UB, Total assist LB Tub Shower transfers: N/A Equipment: See above    IADLs: Shopping: Relies on family to assist Light housekeeping: Total Assist Meal Prep: Total Assist Community mobility:   Medication management:  Total Assist  Financial management: N/A Handwriting: Not legible: Pt. Is able to hold  a pen with the left hand, and initiate marking the page. Pt.'s eye glasses were not available  MOBILITY STATUS:  Power w/c  POSTURE COMMENTS:  Pt. Requires position changes in his power w/c  ACTIVITY TOLERANCE: Activity tolerance:  Fair  FUNCTIONAL OUTCOME MEASURES: FOTO: 40  TR score: 45  05/25/2022:   FOTO: 50 TR score: 45  07/20/22: FOTO 55  UPPER EXTREMITY ROM     Active ROM Right eval Right 03/21/2022 Right 05/25/2022 R  07/20/22 Right  07/25/22 Right 08/15/2022 Right 09/26/2022 Left eval Left  03/21/2022 Left 05/25/2022 L  07/20/22 Left 07/25/2022 Left  08/15/2022 Left  09/26/2022  Shoulder flexion 106 scaption 105  Scaption 62 flexion 110 scaption 100  105 105 119 123 125 130  136 138  Shoulder abduction 114 90 97 100  105 117 110 115 115 115  118 121  Shoulder adduction                Shoulder extension                Shoulder internal rotation                Shoulder external rotation                Elbow flexion 120(130) 145 145 140  144 140 135(135) 145 145   145   Elbow extension -45(-35) -22(-35) -28(-22)  -32(-21) -32(-28) -28(-26) -27(-18) -21(-20) -20(-16)  -25(-10) -23(-10) -21(-10)  Wrist flexion                Wrist extension -30(10) -25(10) -10(30)  -10(20) -10(25) -20(40) 10(50) 19(50) 28(50)  30(50) 35(55) 36(55)  Wrist ulnar deviation                Wrist radial deviation                Wrist pronation                Wrist supination   Limited by flexor tone       Limited by flexor tone      (Blank rows = not tested)  UPPER EXTREMITY MMT:     Right eval Right 03/21/2022 Right  05/25/2022 R 07/20/22 Right 08/25/2022 Right 09/26/2022 Left eval Left 03/21/2022 Left  05/25/2022 L 07/20/22 Left  08/15/2022 Left 09/26/2022  Shoulder flexion 3-/5 scaption 3-/5 3-/5 3+/5 3+/5 3+/5 3-/5 3/5 3+/5 4+/5 4+/5 4+/5  Shoulder abduction 3-/5 3-/5 3-/5 4/5 4/5 4/5 3-/5 3-/5 4-/5 4+/5 4+/5 4+5  Shoulder adduction              Shoulder extension               Shoulder internal rotation              Shoulder external rotation              Elbow flexion 3/5  3/5 3+/5 4/5 4+/5 4+/5 3/5 3+/5 4-/5 4+/5 5/5 5/5  Elbow extension 2-/5 2/5 3-/5 4+/5 4+/5 4+/5 2-/5 2/5  4+/5 4+/5 4+/5  Wrist flexion              Wrist extension 2-/5 2-/5 2/5 2/5 2/5 2/5 2/5 2+/5 3-/5  3-/5 3-/5  Wrist ulnar deviation              Wrist radial deviation              Wrist pronation              Wrist supination              (Blank rows = not tested)   HAND FUNCTION:  Grip strength: Right: 0 lbs; Left: 0 lbs and Lateral pinch: Right: 5 lbs, Left: 2 lbs  03/21/2022: Lateral pinch: Right: 3.5 lbs, Left: 2 lbs  07/20/22: Grip strength: Right: 3 lbs; Left: 4 lbs and Lateral pinch: Right: 5 lbs, Left: 3 lbs  08/15/2022:  Grip strength: Right: 0 lbs; Left: 5 lbs and Lateral pinch: Right: 2 lbs, Left: 4 lbs  09/26/2022  Grip strength: Right: 0 lbs; Left: 8 lbs and Lateral pinch: Right: 2 lbs, Left: 5 lbs   Bilateral digit PIP/DIP flexion contractures with MP hyperextension with attempts for AROM. Pt. is able to tolerate AROM to the bilateral digits at the initial evaluation however, has a history of pain in the digits.  COORDINATION: Eval: Pt. is unable to grasp 9-hole test pegs. Pt. is able to initiate grasping larger pegs, and is able to hold a pen in the left hand.  07/20/22: 2 min 36 seconds to remove 9 pegs from 9 hole peg test - cues to locate pegs 2/2 low vision. Pt. is able to initiate grasping larger pegs on R hand and is able to hold a pen in the left hand.  08/15/2022:      Vision, and sensation limiting accuracy of 9 hole peg test results. Pt. Was able to grasp and remove vertical pegs intermittently with cues.    SENSATION: Light touch: Impaired   EDEMA:  N/A  MUSCLE TONE: BUE flexor Spasticity  COGNITION Overall cognitive status: Continue to assess in functional context  VISION:   Subjective report: Pt. was not wearing glasses at the time of  the initial eval.  Baseline vision: Vision is very limited. Wears glasses all the time Visual history: History of impaired vision following CVA. Pt. Has received treatment through the [facility] low vision rehabilitation program.   VISION ASSESSMENT: Impaired To be further assessed in functional context  PERCEPTION: Impaired   PRAXIS: Impaired: motor planning  OBSERVATIONS:  Pt reports being on Tramadol   TODAY'S TREATMENT  Self Care/Home Management: Pt worked on self feeding using a swivel spork in his L hand. Initially required MIN A to support proper wrist movement to prevent beads from falling off, improved to SETUP with time and cues. Pt scooped 1-6 beads at a time onto spork and brought to mouth to simulate self feeding. Significantly increased time and rest breaks to completely remove large bowlful of beads. Family reports difficulty with spoon use only at home, requires setup and intermittent adjustment of bowl 2/2 visual deficits. Pt wrote his name in print and cursive with 50% and 75% legibility respectively.   Therapeutic Exercise: Tolerated 10 min at speed bag alternating L and R 10 punches each for 5 min and alternating L and R every other punch for  5 min with rest breaks throughout.    PATIENT EDUCATION: Education details: Doctor, general practice and fork positioning for self feeding Person educated: Patient and Parent Education method: Explanation, Demonstration, Tactile cues, and Verbal cues Education comprehension: verbalized understanding, returned demonstration, verbal cues required, and needs further education  HOME EXERCISE PROGRAM:  Continue ongoing assessment, and continue to provide as needed.   GOALS: Goals reviewed with patient? Yes  SHORT TERM GOALS: Target date: 11/07/2022   To assess splint fit, and make appropriate adjustments to promote good skin integrity through the palmar surface of the bilateral hands.  Baseline: 05/25/22: Goal currently met, however ongoing as  needs to assess splint fit arise. 03/23/2022: Pt. is wearing splints a couple of hours at night bilateral resting hand splints. 03/21/2022: Pt. is wearing splints a couple of hours at night bilateral resting hand splints. Goal status: Continue ongoing assessment as requested, and as needed   LONG TERM GOALS: Target date: 12/19/2022    FOTO score will Improve by 2 points for Pt. perceived improvement with the assessment specific ADL/IADL tasks.  Baseline: 09/27/2022: 51 07/20/2022: FOTO 55 05/25/2022: FOTO score: 50,  TR score: 45 Eval: FOTO score: 40,  TR score: 45 Goal status: Achieved   2.   Pt. will independently perform oral care for 100% of the task after complete set-up. Baseline: 09/26/2022: Completes 90% of the task, proximal assist at times required at the elbow.  08/15/2022: Completes 90% of the task, proximal assist at times required at the elbow. 07/20/22: completes 90% of task, limited by shoulder flexion. 05/25/2022:  Pt. Is able to initiate and perform oral care for approximately 90% of the task. Complete set-up required. Assi needed only for the very back teeth. 03/23/2022: Pt. Is able to initiate and perform oral care for approximately 75% of the task. Pt. Requires assist at proximally at the elbow for through oral care. 03/21/2022: Pt. Is able to initiate and perform oral care for approximately 75% of the task. Pt. Requires assist at proximally at the elbow for through oral care. Eval: Pt. is able to initiate using an electric toothbrush. Pt. requires assist for set-up, and assist for thoroughness, and as he Pt. fatigues. Goal status: Ongoing  3.  Pt. Will be independent with self-feeding for 100% of the meal after complete set-up Baseline:09/26/2022: 90% using the spoon, assit at time with the forearm motion, and grip on the utensils. 100% for finger foods.  08/15/2022:  90% using the spoon, assit at time with the forearm motion, and grip on the utensils. 100% for finger foods-independent  with set-up- unsupervised.  07/20/22: self-feeds cereal using spoon 90% of task. 05/25/2022: Pt. Is able to use a spoon to scoop cereal when feeding himself cereal 85% of the time. Pt. Is able to feed himself snack/finger foods 100% of the time. Pt. Continues to work on consistency of  stabilizing a cup/mug when drinking. Pt. Is able to grasp a water bottle with assist initially, with assist tapering off as he drinks.03/23/2022: Pt. Is able to perform scooping cereal for 75% of the time. Pt. required assist, and support at the left elbow, and Pt. Presents with limited forearm supination when using the spoon, and bringing it towards his mouth. Pt. Is able to use a fork to spear items, and perform the hand to mouth pattern.  03/21/2022: Pt. Is able to perform scooping cereal for 75% of the time. Pt. required assist, and support at the left elbow, and Pt. presents with limited forearm supination  when using the spoon, and bringing it towards his mouth. Pt. Is able to use a fork to spear items, and perform the hand to mouth pattern.  Eval: Pt. is able to hold standard standard utensils. Pt. Performs as much of the task as he, can and has assistance for the remainder. Goal status: Ongoing  4.  Pt. Will improve grasp patterns and consistently grasp 1/4" objects for ADL, and IADL tasks.  Baseline: 09/26/2022: Continues to grasp 1/4" pegs with min a + visual cues, consistently grasping 1/2" objects with visual cues. 08/15/2022: grasps 1/4" pegs with min a + visual cues, consistently grasping 1/2" objects with visual cues. 07/20/22: grasps 1/4" pegs with min a + visual cues, consistently grasping 1/2" objects with visual cues. 05/25/2022: Pt. Is working on improving consistency of grasping 1/2" objects with visual cues.  03/23/2022: Pt. Is able to grasp 1" objects consistently,and continues to work on the hand patterns needed to grasp 1/2" objects.03/21/2022: Pt. Is able to grasp 1" objects consistently,and continues to work on  the hand patterns needed to grasp 1/2" objects. Eval: Pt. is able to grasp 1" objects intermittently using a lateral grasp pattern. Goal status: Progressing; goal revised to 1/4" items  5.  Pt. will independently write his name legibly with letter sizes under 1". Baseline: 09/26/2022: Pt. Continues to be able to write name with smaller letter size. Pt. Is signing name with a computer styus. Pt. Requires visual cues, and assist. 08/15/2022: Pt. Is able to write name with smaller letter size. Pt. Is signing name with a computer styus. Pt. Requires visual cues, and assist. 07/20/22: stabilizing assist to write name with 75% legibility with 2" letters. 05/25/2022: Pt. to continue to work towards formulating grasp patterns in preparation for grasping a large width pen. Pt. Requires visual cues. 03/23/2022: Pt. Is able to write his name with modA, however has difficulty with formulating letter sizes less than 2" in size with 50% legibility for the 3 letters of his name.03/21/2022: Pt. Is able to write his name with modA, however has difficulty with formulating letter sizes less than 2" in size with 50% legibility for the 3 letters of his name. Eval: Pt is able to hold a thin marker with his left hand, and formulate a line, and initiate a circular pattern (Pt. without glasses today) Goal status: Progressing; Goal revised for more control with smaller writing size  6. Pt. Will reach up to comb/brush his hair  with minA.  Baseline: 09/26/2022: Pt. Is able to use a hair pick for the left side of his head using his left hand. Pt. Presents with difficulty reaching to the right side of his head. 5/13/202: 75% using a hair pick, assist to the right side of the head. 07/20/22: reaches 75% of head, assist for far R side of head, fatigues quickly. 05/25/2022: Pt. Is able to reach up with the left hand to the left side, top, and back of his head. 03/23/2022: Pt. is now able to more consistently initiate reaching up to his head with  his left hand in preparation for haircare12/18/2023: Pt. is now able to more consistently initiate reaching up to his head with his left hand in preparation for haircare. Eval: Pt. is able to initiate reaching up for hair care with a long handled brush, however is unable to sustain UE's in elevation to perform the task.     Goal status: Ongoing  7. Pt. Will independently navigate the w/c through his environment with minA with  visual scanning, and hand placement on the controls.  Baseline: 09/26/2022: Pt. Is now navigating his w/c ouside the home, and around his block. 08/15/2022: Pt. Requires minA to setup hand on controls and MIN cues to navigate the w/c in wide spaces, requires MIN - MOD cues to navigate the w/c through more narrow doorways, and tighter turns - varies based on good vs bad vision days.07/20/22: Pt. Requires minA to setup hand on controls and MIN cues to navigate the w/c in wide spaces, requires MIN - MOD cues to navigate the w/c through more narrow doorways, and tighter turns - varies based on good vs bad vision days. 05/25/2022: Pt. Requires minA  and  cues to navigate the w/c in wide spaces, and requires Mod cues to navigate the w/c through more narrow doorways, and tighter turns. Pt. Requires max cues for scanning through the environment, and moderate cues for hand placement on the controls.     Goal status: Ongoing    8. Pt. Will improve bilateral grip strength to be able to independently grasp, and pull up blankets, and linens.   Baseline: 09/26/2022: R: 0  L: 8# Pt. is attempting to grasp, and pull blankets up more, using mostly the left hand. 08/15/2022: Pt. has difficulty securely holding, and pulling up blankets, and linens.    Goal status: Ongoing  9. Pt. will consistently actively control the releasing  of blankets, covers, and linens from his hands once they are in the desired position over him. Baseline: 09/26/2022: Pt. Is improving with actively releasing blankets form his hands.  08/15/2022: Pt. has difficulty with controlled releasing of blankets/linens from his grasp.     Goal status: Ongoing    10. Pt. Will improve bilateral UE strength by 2 MM grades to assist with ADLs, and IADLs  Baseline: Right: shoulder flexion: 3+/5, abduction: 4/5, elbow flexion: 4+/5 , extension: 4+/5 wrist extension: 2/5; Left: shoulder flexion: 4+/5, abduction: 4+/5, elbow flexion: 5/5 , extension: 4+/5 wrist extension: 3-/5   ASSESSMENT: CLINICAL IMPRESSION: Good tolerance for seated UE exercise to alternate L and R arm punches to speed bag. Pt worked on self feeding using a swivel spork in his L hand. Improved from MIN A to facilitate wrist movement to SETUP with time and cues. Pt scooped 1-6 beads at a time onto spork and brought to mouth to simulate self feeding. Pt wrote his name in print and cursive with 50% and 75% legibility respectively. Pt. continues to work on improving BUE functioning, ROM, strength, motor control, [facility] skills, and visual compensatory strategies to be able to independently pull up, and release blankets/covers, improve writing legibility, improve self-care including oral care, and utensil use during self-feeding tasks, and maximize independence with ADLs, and IADLs.     PERFORMANCE DEFICITS in functional skills including ADLs, IADLs, coordination, dexterity, proprioception, ROM, strength, pain, FMC, GMC, decreased knowledge of use of DME, and UE functional use, cognitive skills including safety awareness, and psychosocial skills including coping strategies, environmental adaptation, habits, and routines and behaviors.   IMPAIRMENTS are limiting patient from ADLs, IADLs, leisure, and social participation.   COMORBIDITIES may have co-morbidities  that affects occupational performance. Patient will benefit from skilled OT to address above impairments and improve overall function.  MODIFICATION OR ASSISTANCE TO COMPLETE EVALUATION: Maximum or significant modification of  tasks or assist is necessary to complete an evaluation.  OT OCCUPATIONAL PROFILE AND HISTORY: Comprehensive assessment: Review of records and extensive additional review of physical, cognitive, psychosocial history related to current  functional performance.  CLINICAL DECISION MAKING: High - multiple treatment options, significant modification of task necessary  REHAB POTENTIAL: Fair    EVALUATION COMPLEXITY: High    PLAN: OT FREQUENCY: 2 x's a week  OT DURATION:12 weeks  PLANNED INTERVENTIONS: self care/ADL training, therapeutic exercise, therapeutic activity, neuromuscular re-education, manual therapy, passive range of motion, patient/family education, and cognitive remediation/compensation RECOMMENDED OTHER SERVICES: PT  CONSULTED AND AGREED WITH PLAN OF CARE: Patient and family member/caregiver  PLAN FOR NEXT SESSION: Initiate treatment  Kathie Dike, M.S. OTR/L  11/14/22, 4:35 PM  ascom [phone]

## 2022-11-14 NOTE — Therapy (Signed)
OUTPATIENT PHYSICAL THERAPY TREATMENT   Patient Name: Paul Hall MRN: [id number] DOB:January 05, 1996, 27 y.o., male Today's Date: 11/15/2022  PCP:  Cindi Carbon PROVIDER:  Lenise Herald, PA-C   PT End of Session - 11/14/22 1632     Visit Number 117    Number of Visits 128    Date for PT Re-Evaluation 11/30/22    Authorization Type [employer] COMM [employer]/ [place] [employer]    Progress Note Due on Visit 120    PT Start Time 1535    PT Stop Time 1614    PT Time Calculation (min) 39 min    Activity Tolerance Patient tolerated treatment well;Patient limited by fatigue    Behavior During Therapy WFL for tasks assessed/performed              Past Medical History:  Diagnosis Date   Diabetes mellitus (HCC)    Hypertension    Stroke (HCC)    Past Surgical History:  Procedure Laterality Date   IVC FILTER PLACEMENT ([facility] HX)     LEG SURGERY     PEG TUBE PLACEMENT     TRACHEOSTOMY     Patient Active Problem List   Diagnosis Date Noted   Sepsis due to vancomycin resistant Enterococcus species (HCC) 06/06/2019   SIRS (systemic inflammatory response syndrome) (HCC) 06/05/2019   Acute lower UTI 06/05/2019   VRE (vancomycin-resistant Enterococci) infection 06/05/2019   Anemia 06/05/2019   Skin ulcer of sacrum with necrosis of muscle (HCC)    Urinary retention    Type 2 diabetes mellitus without complication, with long-term current use of insulin (HCC)    Tachycardia    Lower extremity edema    Acute metabolic encephalopathy    Obstructive sleep apnea    Morbid obesity with BMI of 60.0-69.9, adult (HCC)    Goals of care, counseling/discussion    Palliative care encounter    Sepsis (HCC) 04/27/2019   H/O insulin dependent diabetes mellitus 04/27/2019   History of CVA with residual deficit 04/27/2019   Seizure disorder (HCC) 04/27/2019   Decubitus ulcer of sacral region, stage 4 (HCC) 04/27/2019    REFERRING DIAG: Cerebral infarction, unspecified   THERAPY DIAG:  Muscle weakness  (generalized)  Other lack of coordination  History of CVA with residual deficit  Abnormality of gait and mobility  Difficulty in walking, not elsewhere classified  Unsteadiness on feet  Rationale for Evaluation and Treatment Rehabilitation  PERTINENT HISTORY: Paul Hall is a 26yoM who presents with severe weakness, quadriparesis, altered sensorium, and visual impairment s/p critical illness and prolonged hospitalization. Pt hospitalized in October 2020 with ARDS 2/2 COVID19 infection. Pt sustained a complex and lengthy hospitalization which included tracheostomy, prolonged sedation, ECMO. In this period pt sustained CVA and SDH. Pt has now been liberated from tracheostomy and G-tube. Pt has since been hospitalized for wound infection and UTI. Pt lives with parents at home, has hospital bed and left chair, hoyer lift transfers, and power WC for mobility needs. Pt needs heavy physical assistance with ADL 2/2 BUE contractures and motor dysfunction   PRECAUTIONS: Fall  SUBJECTIVE:  Patient reports feeling pretty good and had a great time at a wrestling event with no transportation issues.    PAIN:  Are you having pain? No   TODAY'S TREATMENT:  - 11/14/2022    -LAQ x 5 alt LE as warm up  -knee flexion x 15 reps each LE -Hip march (VC to clear foot off floor) x 15 reps each  Sit to stand with  max A +2 x 4 reps today Static stand at edge of mat with Max A +2 (knees blocked) and VC to clear bottom off mat x 4 trials- ranging from 20 sec to approx 40 sec     PATIENT EDUCATION: Education details: Progression of intensity at home with trunk strength exercises and how to grade intensity using a goniometer bubble app.   HOME EXERCISE PROGRAM:  Access Code: [email or web address] URL: [email or web address] Date: 03/23/2022 Prepared by: Maureen Ralphs  Exercises - Supine Bridge  - 3 x weekly - 3 sets - 10 reps - 2 hold - Supine Gluteal Sets  - 3 x weekly - 3 sets - 10 reps -  5 sec hold - Supine Quad Set  - 1 x daily - 3 x weekly - 3 sets - 10 reps - 5 hold - Seated Long Arc Quad  - 1 x daily - 7 x weekly - 3 sets - 10 reps - Seated Hip Adduction Squeeze with Ball  - 1 x daily - 3 x weekly - 3 sets - 10 reps - 5 hold - Seated Hip Abduction  - 1 x daily - 3 x weekly - 3 sets - 10 reps - 2 hold    PT Short Term Goals -       PT SHORT TERM GOAL #1   Title Pt will be independent with HEP in order to improve strength and balance in order to decrease fall risk and improve function at home and work.    Baseline 01/04/2021= No formal HEP in place; 12/12 no HEP in place; 05/10/2021-Patient and his father were able to report compliance with curent HEP consisting of mostly seated/reclined LE strengthening. Both verbalize no questions at this time.    Time 6    Period Weeks    Status Achieved    Target Date 02/15/21            PT LONG TERM GOAL #1     Title Patient will increase BLE gross strength by 1/2 muscle grade to improve functional strength for improved independence with potential gait, increased standing tolerance and increased ADL ability.     Baseline 01/04/2021- Patient presents with 1/5 to 3-/5 B LE strength with MMT; 12/12: goal partially met for Left knee/hip; 05/10/2021= 2-/5 bilateal Hip flex; 3+/5 bilateral Knee ext; 06/21/2021= Patient presents with 2-/5 bilateral Hip flex; 3+/5 bilateral knee ext/flex; 2-/5 left ankle DF; 0/5 right ankle- and able to increase reps and resistance with LE's. 09/15/2021- Patient technically presents with 2-/5 B hip flex/abd/add - but he is able to raise his hip up to approx 100 deg which has improved. 3+/5 Bilateral knee ext, 2-/5 left ankle and 0/5 right ankle.  12/08/2021= Patient able to lift left knee at 110 deg of hip flex; presents with 3+/5 knee ext, 2-/5 left ankle DF and 0/5 right ankle DF, 2-/5 bilateral Hip abd in seated position.    12/6: R: knee 3+/5 ext, 2/5 flexion, left knee 3+/5 extension, 3+/5 flexion, R hip: 2+/5  hip add, 2+/5 hip ABD L hip: 4-/5 hip ABD, 3+/5 hip ADD, 3+/5 hip flexion; 06/06/2022= Patient now presents with 2-/5 right ankle DF/PF;     Time 12     Period Weeks     Status MET    Target Date 03/07/2022         PT LONG TERM GOAL #2    Title Patient will tolerate sitting unsupported demonstrating erect sitting posture for 15 minutes with CGA to  demonstrate improved back extensor strength and improved sitting tolerance.     Baseline 01/04/2021- Patient confied to sitting in lift chair or electric power chair with back support and unable to sit upright without physical assistance; 12/12: tolerates <1 minutes upright unsupported sitting. 05/10/2021=static sit with forward trunk lean  in his power wheelchair without back support x approx 3 min. 06/21/2021=Unable to assess today due to patient with acute back pain but on previous visit able to sit x 8 min without back support. 09/15/2021- on last visit- 09/13/2021- patient was able to sit unsupported x 8 min at edge of mat. 10/13/2023 - Patient was able to sit at edge of mat with varying level of assist today from SBA to min A for a total of 20 min. 12/13/2021= Patient demonstrated unsupported sitting at edge of mat for approx 20 min    Time 12     Period Weeks     Status GOAL MET    Target Date 12/08/21          PT LONG TERM GOAL #3    Title Patient will demonstrate ability to perform static standing in // bars > 2 min with Max Assist  without loss of balance and fair posture for improved overall strength for pre-gait and transfer activities.     Baseline 01/04/2021= Patient current uanble to stand- Dependent on hoyer or sit to stand lift for transfers. 05/10/2021=Not appropriate yet- Currently still dependent with all transfers using hoyer. 06/21/2021= Patient continuing now to focus on LE strengthening to prepare for standing-unable to try today due to acute low back pain-  planning on attempting in new cert period. 09/15/2021- Patient has attempted standing 2x in  past two week- max Assist of 2 people - only once was he successful to clearing his bottom from chair - Will continue to be a focus during the new certification. 12/13/2021= Patient has been limited secondary to increased overall low back pain during this certification and will require more time to focus on this goal.  12/6: not assessed this date, will assess at date when 2-3 PTs are present for assistance     Time 12     Period Weeks     Status GOAL not appropriate at this time - may attempt in future once Patient presents with improved overall LE strength.                 PT LONG TERM GOAL #4    Title Pt will improve FOTO score by 10 points or more demonstrating improved perceived functional ability     Baseline FOTO 7 on 10/17; 03/15/21: FOTO 12; 05/10/2021 06/21/2021= 1; 09/15/2021= 9; 12/13/2021= Will issue next visit 12/6: 4; 06/06/2022= Will assess next visit    Time 12     Period Weeks     Status ONGOING    Target date 11/30/2022         PT LONG TERM GOAL #5    Title Patient will perform sit to stand transfer with appropriate AD and max assist of 2 people with 75% consistency to prepare for pregait activities.     Baseline 09/15/2021= Patient unable to stand well- unable to clear his bottom off chair with Max assist of 2 persons. 12/13/2021- Goal not appropriate to try yet but will keep and roll over to next cert as shift continues to focus on transfers/standing; 4/24= Patient able to perform active ankle DF/PF with right LE and able to raise his knee into seated march  and clear floor without physical assist today - as previously unable as well as lift right knee ext to near full ROM to improve strength for eventual standing. 09/07/2022- Goal continues to not be appropriate but patient is now standing some and will keep goal active    Time 12     Period Weeks     Status Goal still not appropriate for now but will keep active for future    Target date         PT LONG TERM GOAL #6  Title Patient  will tolerate sitting unsupported demonstrating erect sitting posture for 30 minutes with CGA to demonstrate improved back extensor strength and improved sitting tolerance.   Baseline 12/13/2021= Patient demonstrated unsupported sitting at edge of mat for approx 20 min;  12/29/2021- Patient performed approx 30 min of dynamic sitting activities today. 06/06/2022= Patient demonstrated ability to sit and perform static and dynamic UE/LE movement with only Supervision.   Time 12   Period Weeks   Status GOAL MET           7.  Patient will tolerate 5 minutes or more of standing in sit to stand lift or with max assist +2 in order to indicate improved lower extremity weightbearing tolerance for progression to standing in parallel bars. Baseline: 1 minute on most recent stand 02/21/22; 06/06/2022- Patient did attempt today after complaining of right LE pain last week. He attempted 3 stands using sit to stand lift. - all over 1 min- last one approx 48 sec- stopped due to fatigue. More erect standing in lift today - still poor gluteal strength but able to activate glutes and extend hips upon command but unable to hold > 5 sec. 07/28/2022- Will attempt standing next visit but patient has been able to stand since last progress note for up to 3 min at a time at his best. 09/07/2022-  attempted standing with max +2 and patient able to stand 15-20 sec so will now  revise goal Goal status: GOAL Revised/ongoing Target date: 11/30/2022   8. Patient will tolerate sitting unsupported demonstrating ability to perform dynamic UE/LE activities for 30 minutes  independently to demonstrate ability to sit at edge of bed to eat or perform some ADL's/exercise for optimal quality of life.             Baseline: 06/06/2022- Patient able to sit and perform some UE/LE exercises but requires CGA at times for safety- he is able to static sit for 30 min with supervision. 07/27/2022= Patient able to now sit > 30 min with Supervision only -  performing static and dynamic activities. Will keep goal active to ensure patient is consistent. 09/07/2022=  Patient able to demonstrate consistent ability to sit at edge of mat during visits             Goal Status: MET             Target date: 08/29/2022        Plan     Clinical Impression Statement Patient performed well overall today- able to increase time on his feet with improved hip extension-able to clear his bottom better and no report of pain.  Pt will continue to benefit from skilled physical therapy intervention to address impairments, improve QOL, and attain therapy goals.    Personal Factors and Comorbidities Comorbidity 3+;Time since onset of injury/illness/exacerbation    Comorbidities CVA, diabetes, Seizures    Examination-Activity Limitations Bathing;Bed Mobility;Bend;Caring for Others;Carry;Dressing;Hygiene/Grooming;Lift;Locomotion Level;Reach Overhead;Self Feeding;Sit;Squat;Stairs;Stand;Transfers;Toileting  Examination-Participation Restrictions Cleaning;Community Activity;Driving;Laundry;Medication Management;Meal Prep;Occupation;Personal Finances;Shop;Yard Work;Volunteer    Stability/Clinical Decision Making Evolving/Moderate complexity    Rehab Potential Fair    PT Frequency 2x / week    PT Duration 12 weeks    PT Treatment/Interventions ADLs/Self Care Home Management;Cryotherapy;Electrical Stimulation;Moist Heat;Ultrasound;DME Instruction;Gait training;Stair training;Functional mobility training;Therapeutic exercise;Balance training;Patient/family education;Orthotic Fit/Training;Neuromuscular re-education;Wheelchair mobility training;Manual techniques;Passive range of motion;Dry needling;Energy conservation;Taping;Visual/perceptual remediation/compensation;Joint Manipulations    PT Next Visit Plan core strength/motor control in short-sitting, sit to stand if able; Continue with progressive LE Strengthening. Stand activities as appropriate.     PT Home Exercise Plan No  changes to HEP today    Consulted and Agree with Plan of Care Patient;Family member/caregiver    Family Member Consulted Dad            9:28 AM, 11/15/22 Louis Meckel, PT Physical Therapist - [facility]  Outpatient Physical Therapy- [facility] [phone]      11/15/2022, 9:28 AM

## 2022-11-16 ENCOUNTER — Ambulatory Visit: Payer: Medicare Other | Admitting: Occupational Therapy

## 2022-11-16 ENCOUNTER — Ambulatory Visit: Payer: BC Managed Care – PPO

## 2022-11-16 DIAGNOSIS — R2681 Unsteadiness on feet: Secondary | ICD-10-CM | POA: Diagnosis not present

## 2022-11-16 DIAGNOSIS — R269 Unspecified abnormalities of gait and mobility: Secondary | ICD-10-CM

## 2022-11-16 DIAGNOSIS — I6930 Unspecified sequelae of cerebral infarction: Secondary | ICD-10-CM | POA: Diagnosis not present

## 2022-11-16 DIAGNOSIS — R262 Difficulty in walking, not elsewhere classified: Secondary | ICD-10-CM

## 2022-11-16 DIAGNOSIS — M6281 Muscle weakness (generalized): Secondary | ICD-10-CM

## 2022-11-16 DIAGNOSIS — R278 Other lack of coordination: Secondary | ICD-10-CM

## 2022-11-16 NOTE — Therapy (Signed)
Occupational Therapy Progress Recertification Note  Dates of reporting period  09/26/2022   to   11/16/2022   Patient Name: Paul Hall MRN: [id number] DOB:06/04/1995, 27 y.o., male Today's Date: 11/16/2022  PCP: Dr. Sherwood Gambler REFERRING PROVIDER: Dr. Sherwood Gambler   OT End of Session - 11/16/22 1621     Visit Number 50    Number of Visits 84    Date for OT Re-Evaluation 02/08/23    OT Start Time 1618    OT Stop Time 1700    OT Time Calculation (min) 42 min    Activity Tolerance Patient tolerated treatment well    Behavior During Therapy WFL for tasks assessed/performed                 Past Medical History:  Diagnosis Date   Diabetes mellitus (HCC)    Hypertension    Stroke [facility])    Past Surgical History:  Procedure Laterality Date   IVC FILTER PLACEMENT ([facility] HX)     LEG SURGERY     PEG TUBE PLACEMENT     TRACHEOSTOMY     Patient Active Problem List   Diagnosis Date Noted   Sepsis due to vancomycin resistant Enterococcus species (HCC) 06/06/2019   SIRS (systemic inflammatory response syndrome) (HCC) 06/05/2019   Acute lower UTI 06/05/2019   VRE (vancomycin-resistant Enterococci) infection 06/05/2019   Anemia 06/05/2019   Skin ulcer of sacrum with necrosis of muscle (HCC)    Urinary retention    Type 2 diabetes mellitus without complication, with long-term current use of insulin (HCC)    Tachycardia    Lower extremity edema    Acute metabolic encephalopathy    Obstructive sleep apnea    Morbid obesity with BMI of 60.0-69.9, adult (HCC)    Goals of care, counseling/discussion    Palliative care encounter    Sepsis (HCC) 04/27/2019   H/O insulin dependent diabetes mellitus 04/27/2019   History of CVA with residual deficit 04/27/2019   Seizure disorder (HCC) 04/27/2019   Decubitus ulcer of sacral region, stage 4 (HCC) 04/27/2019   ONSET DATE: 01/2019  REFERRING DIAG: CVA/COVID-19  THERAPY DIAG:  No diagnosis found.  Rationale for Evaluation and  Treatment Rehabilitation  SUBJECTIVE:   SUBJECTIVE STATEMENT:  Pt. Reports he is doing well and had a fantastic time at his wrestling event this weekend.  Pt accompanied by: self and family member  PERTINENT HISTORY:  Pt. is a 27 y.o. male who was diagnosed with COVID-19, and CVA with resultant quadriplegia. Pt. Was then hospitalized with VRE UTI. PMHx includes: urinary retention, seizure disorder, obstructive sleep disorder, DM Type II, Morbid obesity.   PRECAUTIONS: None  WEIGHT BEARING RESTRICTIONS No  PAIN:  Are you having pain? No pain reported  FALLS: Has patient fallen in last 6 months? No  LIVING ENVIRONMENT: Lives with: lives with their family Lives in: House/apartment Stairs: No Level Entry Has following equipment at home: Wheelchair (power) and hoyer lift, sit to stand lift  PLOF: Independent  PATIENT GOALS: To be able to engage in more daily care tasks.  OBJECTIVE:   HAND DOMINANCE: Right  ADLs:  Transfers/ambulation related to ADLs: Eating: Pt. reports being able to hold standard utensils, and is starting to engage more in self-feeding tasks, hand to mouth patterns. Pt. Reports that he does as much as he can with the task, and family assists  with the remainder of the task. Grooming: Pt. Is able to initiate holding an electric toothbrush, and brush his teeth. Family  assists LB Dressing: Total Assist UE dressing: Pt. is now able to reach up to actively assist with grasping , and pulling his gown down. Toileting:  Total Assist Bathing: MaxA UB, Total assist LB Tub Shower transfers: N/A Equipment: See above    IADLs: Shopping: Relies on family to assist Light housekeeping: Total Assist Meal Prep: Total Assist Community mobility:   Medication management:  Total Assist  Financial management: N/A Handwriting: Not legible: Pt. Is able to hold a pen with the left hand, and initiate marking the page. Pt.'s eye glasses were not available  MOBILITY STATUS:   Power w/c  POSTURE COMMENTS:  Pt. Requires position changes in his power w/c  ACTIVITY TOLERANCE: Activity tolerance:  Fair  FUNCTIONAL OUTCOME MEASURES: FOTO: 40  TR score: 45  05/25/2022:   FOTO: 50 TR score: 45  07/20/22: FOTO 55  UPPER EXTREMITY ROM     Active ROM Right eval Right 03/21/2022 Right 05/25/2022 R  07/20/22 Right  07/25/22 Right 08/15/2022 Right 09/26/2022 Right Left eval Left  03/21/2022 Left 05/25/2022 L  07/20/22 Left 07/25/2022 Left  08/15/2022 Left  09/26/2022 Left  11/16/2022  Shoulder flexion 106 scaption 105  Scaption 62 flexion 110 scaption 100  105 105 105 119 123 125 130  136 138 130  Shoulder abduction 114 90 97 100  105 117 120 110 115 115 115  118 121 125  Shoulder adduction                  Shoulder extension                  Shoulder internal rotation                  Shoulder external rotation                  Elbow flexion 120(130) 145 145 140  144 140 142 135(135) 145 145 145  145    Elbow extension -45(-35) -22(-35) -28(-22)  -32(-21) -32(-28) -28(-26) -28(-28) -27(-18) -21(-20) -20(-16)  -25(-10) -23(-10) -21(-10) -20(-18)  Wrist flexion                  Wrist extension -30(10) -25(10) -10(30)  -10(20) -10(25) -20(40) -30(10) 10(50) 19(50) 28(50)  30(50) 35(55) 36(55) 36(60)  Wrist ulnar deviation                  Wrist radial deviation                  Wrist pronation                  Wrist supination   Limited by flexor tone        Limited by flexor tone       (Blank rows = not tested)  UPPER EXTREMITY MMT:     Right eval Right 03/21/2022 Right  05/25/2022 R 07/20/22 Right 08/25/2022 Right 09/26/2022 Right 11/16/2022 Left eval Left 03/21/2022 Left  05/25/2022 L 07/20/22 Left  08/15/2022 Left 09/26/2022 Left 11/16/2022  Shoulder flexion 3-/5 scaption 3-/5 3-/5 3+/5 3+/5 3+/5 3/5 3-/5 3/5 3+/5 4+/5 4+/5 4+/5 4+/5  Shoulder abduction 3-/5 3-/5 3-/5 4/5 4/5 4/5 4/5 3-/5 3-/5 4-/5 4+/5 4+/5 4+5 4+/5  Shoulder adduction                 Shoulder extension                Shoulder internal rotation  Shoulder external rotation                Elbow flexion 3/5 3/5 3+/5 4/5 4+/5 4+/5 4+/5 3/5 3+/5 4-/5 4+/5 5/5 5/5 5/5  Elbow extension 2-/5 2/5 3-/5 4+/5 4+/5 4+/5 4+/5 2-/5 2/5  4+/5 4+/5 4+/5   Wrist flexion                Wrist extension 2-/5 2-/5 2/5 2/5 2/5 2/5 2/5 2/5 2+/5 3-/5  3-/5 3-/5 3-/5  Wrist ulnar deviation                Wrist radial deviation                Wrist pronation                Wrist supination                (Blank rows = not tested)   HAND FUNCTION:  Grip strength: Right: 0 lbs; Left: 0 lbs and Lateral pinch: Right: 5 lbs, Left: 2 lbs  03/21/2022: Lateral pinch: Right: 3.5 lbs, Left: 2 lbs  07/20/22: Grip strength: Right: 3 lbs; Left: 4 lbs and Lateral pinch: Right: 5 lbs, Left: 3 lbs  08/15/2022:  Grip strength: Right: 0 lbs; Left: 5 lbs and Lateral pinch: Right: 2 lbs, Left: 4 lbs  09/26/2022  Grip strength: Right: 0 lbs; Left: 8 lbs and Lateral pinch: Right: 2 lbs, Left: 5 lbs  11/16/2022  Grip strength: Right: 0 lbs; Left: 8 lbs   Bilateral digit PIP/DIP flexion contractures with MP hyperextension with attempts for AROM. Pt. is able to tolerate AROM to the bilateral digits at the initial evaluation however, has a history of pain in the digits.  COORDINATION: Eval: Pt. is unable to grasp 9-hole test pegs. Pt. is able to initiate grasping larger pegs, and is able to hold a pen in the left hand.  07/20/22: 2 min 36 seconds to remove 9 pegs from 9 hole peg test - cues to locate pegs 2/2 low vision. Pt. is able to initiate grasping larger pegs on R hand and is able to hold a pen in the left hand.  08/15/2022:      Vision, and sensation limiting accuracy of 9 hole peg test results. Pt. Was able to grasp and remove vertical pegs intermittently with cues.    SENSATION: Light touch: Impaired   EDEMA:  N/A  MUSCLE TONE: BUE flexor Spasticity  COGNITION Overall  cognitive status: Continue to assess in functional context  VISION:   Subjective report: Pt. was not wearing glasses at the time of the initial eval.  Baseline vision: Vision is very limited. Wears glasses all the time Visual history: History of impaired vision following CVA. Pt. Has received treatment through the [facility] low vision rehabilitation program.   VISION ASSESSMENT: Impaired To be further assessed in functional context  PERCEPTION: Impaired   PRAXIS: Impaired: motor planning  OBSERVATIONS:  Pt reports being on Tramadol   TODAY'S TREATMENT   Measurements were obtained, and goals were reviewed with the Pt.    PATIENT EDUCATION: Education details: Doctor, general practice and fork positioning for self feeding Person educated: Patient and Parent Education method: Explanation, Demonstration, Tactile cues, and Verbal cues Education comprehension: verbalized understanding, returned demonstration, verbal cues required, and needs further education  HOME EXERCISE PROGRAM:  Continue ongoing assessment, and continue to provide as needed.   GOALS: Goals reviewed with patient? Yes  SHORT TERM GOALS: Target date: 11/07/2022  To assess splint fit, and make appropriate adjustments to promote good skin integrity through the palmar surface of the bilateral hands.  Baseline: 05/25/22: Goal currently met, however ongoing as needs to assess splint fit arise. 03/23/2022: Pt. is wearing splints a couple of hours at night bilateral resting hand splints. 03/21/2022: Pt. is wearing splints a couple of hours at night bilateral resting hand splints. Goal status: Deferred   LONG TERM GOALS: Target date: 02/08/2023    FOTO score will Improve by 2 points for Pt. perceived improvement with the assessment specific ADL/IADL tasks.  Baseline: 11/16/2022: 52 09/27/2022: 51 07/20/2022: FOTO 55 05/25/2022: FOTO score: 50,  TR score: 45 Eval: FOTO score: 40,  TR score: 45 Goal status: Achieved   2.   Pt. will  independently perform oral care for 100% of the task after complete set-up. Baseline: 11/16/2022: Pt. Completes 90% of the task with complete set-up. 09/26/2022: Completes 90% of the task, proximal assist at times required at the elbow.  08/15/2022: Completes 90% of the task, proximal assist at times required at the elbow. 07/20/22: completes 90% of task, limited by shoulder flexion. 05/25/2022:  Pt. Is able to initiate and perform oral care for approximately 90% of the task. Complete set-up required. Assi needed only for the very back teeth. 03/23/2022: Pt. Is able to initiate and perform oral care for approximately 75% of the task. Pt. Requires assist at proximally at the elbow for through oral care. 03/21/2022: Pt. Is able to initiate and perform oral care for approximately 75% of the task. Pt. Requires assist at proximally at the elbow for through oral care. Eval: Pt. is able to initiate using an electric toothbrush. Pt. requires assist for set-up, and assist for thoroughness, and as he Pt. fatigues. Goal status: Ongoing  3.  Pt. Will be modified independence with self-feeding for 75% using a swivel spoon, and universal fork after complete set-up Baseline: 11/16/2022: 100% with finger foods, Pt. Is initiating with a swivel spoon, and universal cuff for fork use, however requires assist for consistency, and accuracy.  09/26/2022: 90% using the spoon, assist at time with the forearm motion, and grip on the utensils. 100% for finger foods.  08/15/2022:  90% using the spoon, assit at time with the forearm motion, and grip on the utensils. 100% for finger foods-independent with set-up- unsupervised.  07/20/22: self-feeds cereal using spoon 90% of task. 05/25/2022: Pt. Is able to use a spoon to scoop cereal when feeding himself cereal 85% of the time. Pt. Is able to feed himself snack/finger foods 100% of the time. Pt. Continues to work on consistency of  stabilizing a cup/mug when drinking. Pt. Is able to grasp a water  bottle with assist initially, with assist tapering off as he drinks.03/23/2022: Pt. Is able to perform scooping cereal for 75% of the time. Pt. required assist, and support at the left elbow, and Pt. Presents with limited forearm supination when using the spoon, and bringing it towards his mouth. Pt. Is able to use a fork to spear items, and perform the hand to mouth pattern.  03/21/2022: Pt. Is able to perform scooping cereal for 75% of the time. Pt. required assist, and support at the left elbow, and Pt. presents with limited forearm supination when using the spoon, and bringing it towards his mouth. Pt. Is able to use a fork to spear items, and perform the hand to mouth pattern.  Eval: Pt. is able to hold standard standard utensils. Pt. Performs as much of  the task as he, can and has assistance for the remainder. Goal status:  Revised  4.  Pt. Will improve grasp patterns and consistently grasp 1/4" objects for ADL, and IADL tasks.  Baseline: 11/16/2022: Continues to grasp 1/4" pegs with min a + visual cues, consistently grasping 1/2" objects with visual cues 09/26/2022: Continues to grasp 1/4" pegs with min a + visual cues, consistently grasping 1/2" objects with visual cues. 08/15/2022: grasps 1/4" pegs with min a + visual cues, consistently grasping 1/2" objects with visual cues. 07/20/22: grasps 1/4" pegs with min a + visual cues, consistently grasping 1/2" objects with visual cues. 05/25/2022: Pt. Is working on improving consistency of grasping 1/2" objects with visual cues.  03/23/2022: Pt. Is able to grasp 1" objects consistently,and continues to work on the hand patterns needed to grasp 1/2" objects.03/21/2022: Pt. Is able to grasp 1" objects consistently,and continues to work on the hand patterns needed to grasp 1/2" objects. Eval: Pt. is able to grasp 1" objects intermittently using a lateral grasp pattern. Goal status: Progressing  5.  Pt. will independently write his name legibly with letter sizes  under 1". Baseline: 11/16/2022: Pt. Continues to be able to write name with smaller letter size. Pt. Is signing name with a computer styus. Pt. Requires visual cues, and assist 09/26/2022: Pt. Continues to be able to write name with smaller letter size. Pt. Is signing name with a computer styus. Pt. Requires visual cues, and assist. 08/15/2022: Pt. Is able to write name with smaller letter size. Pt. Is signing name with a computer styus. Pt. Requires visual cues, and assist. 07/20/22: stabilizing assist to write name with 75% legibility with 2" letters. 05/25/2022: Pt. to continue to work towards formulating grasp patterns in preparation for grasping a large width pen. Pt. Requires visual cues. 03/23/2022: Pt. Is able to write his name with modA, however has difficulty with formulating letter sizes less than 2" in size with 50% legibility for the 3 letters of his name.03/21/2022: Pt. Is able to write his name with modA, however has difficulty with formulating letter sizes less than 2" in size with 50% legibility for the 3 letters of his name. Eval: Pt is able to hold a thin marker with his left hand, and formulate a line, and initiate a circular pattern (Pt. without glasses today) Goal status: Progressing  6. Pt. Will reach up to comb/brush his hair with minA.  Baseline: 11/16/2022: Pt. Is able to use a hair pick for the left side of his head using his left hand. Pt. Presents with difficulty reaching to the right side of his head.09/26/2022: Pt. Is able to use a hair pick for the left side of his head using his left hand. Pt. Presents with difficulty reaching to the right side of his head. 5/13/202: 75% using a hair pick, assist to the right side of the head. 07/20/22: reaches 75% of head, assist for far R side of head, fatigues quickly. 05/25/2022: Pt. Is able to reach up with the left hand to the left side, top, and back of his head. 03/23/2022: Pt. is now able to more consistently initiate reaching up to his head  with his left hand in preparation for haircare12/18/2023: Pt. is now able to more consistently initiate reaching up to his head with his left hand in preparation for haircare. Eval: Pt. is able to initiate reaching up for hair care with a long handled brush, however is unable to sustain UE's in elevation to perform the task.  Goal status: Ongoing  7. Pt. Will independently navigate the w/c through his environment with minA with visual scanning, and hand placement on the controls.  Baseline: 11/16/2022: Deferred 2/2 vision 09/26/2022: Pt. Is now navigating his w/c ouside the home, and around his block. 08/15/2022: Pt. Requires minA to setup hand on controls and MIN cues to navigate the w/c in wide spaces, requires MIN - MOD cues to navigate the w/c through more narrow doorways, and tighter turns - varies based on good vs bad vision days.07/20/22: Pt. Requires minA to setup hand on controls and MIN cues to navigate the w/c in wide spaces, requires MIN - MOD cues to navigate the w/c through more narrow doorways, and tighter turns - varies based on good vs bad vision days. 05/25/2022: Pt. Requires minA  and  cues to navigate the w/c in wide spaces, and requires Mod cues to navigate the w/c through more narrow doorways, and tighter turns. Pt. Requires max cues for scanning through the environment, and moderate cues for hand placement on the controls.     Goal status: Deferred 2/2 vision    8. Pt. Will improve bilateral grip strength to be able to independently grasp, and pull up blankets, and linens.   Baseline: 11/16/2022: R: 0 L: 8# Pt. Is more consistently grasping his blankets and attempting to pull them up. 09/26/2022: R: 0  L: 8# Pt. is attempting to grasp, and pull blankets up more, using mostly the left hand. 08/15/2022: Pt. has difficulty securely holding, and pulling up blankets, and linens.    Goal status: Ongoing  9. Pt. will consistently actively control the releasing  of blankets, covers, and linens  from his hands once they are in the desired position over him. Baseline: 11/16/2022: Pt. continues to improve with actively releasing blankets form his hands. 09/26/2022: Pt. is improving with actively releasing blankets form his hands. 08/15/2022: Pt. has difficulty with controlled releasing of blankets/linens from his grasp.     Goal status: Ongoing    10. Pt. Will improve bilateral UE strength by 2 MM grades to assist with ADLs, and IADLs  Baseline: 11/16/2022: Right: shoulder flexion: 3/5, abduction: 4/5, elbow flexion: 4+/5 , extension: 4+/5 wrist extension: 2/5; Left: shoulder flexion: 4+/5, abduction: 4+/5, elbow flexion: 5/5 , extension:  wrist extension: 3-/5 Right: shoulder flexion: 3+/5, abduction: 4/5, elbow flexion: 4+/5 , extension: 4+/5 wrist extension: 2/5; Left: shoulder flexion: 4+/5, abduction: 4+/5, elbow flexion: 5/5 , extension: 4+/5 wrist extension: 3-/5  11. Pt. Will improve right wrist extension by 10 degrees to initiate reaching for items in preparation for ADLs. Baseline: 11/16/2022: -30(10)    Goal status: New   ASSESSMENT: CLINICAL IMPRESSION:  Pt. Has made progress overall with BU ROM, self-grooming tasks, writing, and grasping small objects. Pt. Presents with decreased right wrist extension, and right wrist tenderness with extension. A new goal was added for right wrist extension in preparation for initiating reaching. The goal for self-feeding utensil use was revised for swivel spoon, and universal fork cuff use. The goals for splinting, and w/c navigation were deferred. Pt. continues to work on improving BUE functioning, ROM, strength, motor control, [facility] skills, and visual compensatory strategies to be able to independently pull up, and release blankets/covers, improve writing legibility, improve self-care including oral care, and utensil use during self-feeding tasks, and maximize independence with ADLs, and IADLs.     PERFORMANCE DEFICITS in functional skills  including ADLs, IADLs, coordination, dexterity, proprioception, ROM, strength, pain, FMC, GMC, decreased knowledge of  use of DME, and UE functional use, cognitive skills including safety awareness, and psychosocial skills including coping strategies, environmental adaptation, habits, and routines and behaviors.   IMPAIRMENTS are limiting patient from ADLs, IADLs, leisure, and social participation.   COMORBIDITIES may have co-morbidities  that affects occupational performance. Patient will benefit from skilled OT to address above impairments and improve overall function.  MODIFICATION OR ASSISTANCE TO COMPLETE EVALUATION: Maximum or significant modification of tasks or assist is necessary to complete an evaluation.  OT OCCUPATIONAL PROFILE AND HISTORY: Comprehensive assessment: Review of records and extensive additional review of physical, cognitive, psychosocial history related to current functional performance.  CLINICAL DECISION MAKING: High - multiple treatment options, significant modification of task necessary  REHAB POTENTIAL: Fair    EVALUATION COMPLEXITY: High    PLAN: OT FREQUENCY: 2 x's a week  OT DURATION:12 weeks  PLANNED INTERVENTIONS: self care/ADL training, therapeutic exercise, therapeutic activity, neuromuscular re-education, manual therapy, passive range of motion, patient/family education, and cognitive remediation/compensation RECOMMENDED OTHER SERVICES: PT  CONSULTED AND AGREED WITH PLAN OF CARE: Patient and family member/caregiver  PLAN FOR NEXT SESSION: Initiate treatment  Olegario Messier, MS, OTR/L   11/16/22, 4:23 PM  ascom [phone]

## 2022-11-16 NOTE — Therapy (Unsigned)
OUTPATIENT PHYSICAL THERAPY TREATMENT   Patient Name: Paul Hall MRN: [id number] DOB:07-Apr-1995, 27 y.o., male Today's Date: 11/17/2022  PCP:  Cindi Carbon PROVIDER:  Lenise Herald, PA-C   PT End of Session - 11/16/22 2206     Visit Number 118    Number of Visits 128    Date for PT Re-Evaluation 11/30/22    Authorization Type [employer] COMM [place] [place] [employer]    Progress Note Due on Visit 120    PT Start Time 1532    PT Stop Time 1612    PT Time Calculation (min) 40 min    Activity Tolerance Patient tolerated treatment well;Patient limited by fatigue    Behavior During Therapy WFL for tasks assessed/performed              Past Medical History:  Diagnosis Date   Diabetes mellitus (HCC)    Hypertension    Stroke [facility])    Past Surgical History:  Procedure Laterality Date   IVC FILTER PLACEMENT ([facility] HX)     LEG SURGERY     PEG TUBE PLACEMENT     TRACHEOSTOMY     Patient Active Problem List   Diagnosis Date Noted   Sepsis due to vancomycin resistant Enterococcus species (HCC) 06/06/2019   SIRS (systemic inflammatory response syndrome) (HCC) 06/05/2019   Acute lower UTI 06/05/2019   VRE (vancomycin-resistant Enterococci) infection 06/05/2019   Anemia 06/05/2019   Skin ulcer of sacrum with necrosis of muscle (HCC)    Urinary retention    Type 2 diabetes mellitus without complication, with long-term current use of insulin (HCC)    Tachycardia    Lower extremity edema    Acute metabolic encephalopathy    Obstructive sleep apnea    Morbid obesity with BMI of 60.0-69.9, adult (HCC)    Goals of care, counseling/discussion    Palliative care encounter    Sepsis (HCC) 04/27/2019   H/O insulin dependent diabetes mellitus 04/27/2019   History of CVA with residual deficit 04/27/2019   Seizure disorder (HCC) 04/27/2019   Decubitus ulcer of sacral region, stage 4 (HCC) 04/27/2019    REFERRING DIAG: Cerebral infarction, unspecified   THERAPY DIAG:  Muscle weakness  (generalized)  Other lack of coordination  History of CVA with residual deficit  Abnormality of gait and mobility  Difficulty in walking, not elsewhere classified  Unsteadiness on feet  Rationale for Evaluation and Treatment Rehabilitation  PERTINENT HISTORY: Paul Hall is a 26yoM who presents with severe weakness, quadriparesis, altered sensorium, and visual impairment s/p critical illness and prolonged hospitalization. Pt hospitalized in October 2020 with ARDS 2/2 COVID19 infection. Pt sustained a complex and lengthy hospitalization which included tracheostomy, prolonged sedation, ECMO. In this period pt sustained CVA and SDH. Pt has now been liberated from tracheostomy and G-tube. Pt has since been hospitalized for wound infection and UTI. Pt lives with parents at home, has hospital bed and left chair, hoyer lift transfers, and power WC for mobility needs. Pt needs heavy physical assistance with ADL 2/2 BUE contractures and motor dysfunction   PRECAUTIONS: Fall  SUBJECTIVE:  Patient reports feeling rough with upset stomach.    PAIN:  Are you having pain? No   TODAY'S TREATMENT:  - 11/14/2022   *patient dependently transferred from power w/c to mat table via [facility] +2 assist (mother assist PT)   Therapeutic exercises: at edge of mat Resistive lumbar extension into abdominal crunch x 15 reps.  Thoracic rotation x 20 reps each direction Resistive lat pull down (  unilateral) with YTB on right and RTB on left - 2 sets of 10 reps each *patient became ill feeling stating he was straining too much and requested to end sitting session.  No LOB or pain with sitting today.     PATIENT EDUCATION: Education details: Progression of intensity at home with trunk strength exercises and how to grade intensity using a goniometer bubble app.   HOME EXERCISE PROGRAM:  Access Code: [email or web address] URL: [email or web address] Date: 03/23/2022 Prepared by: Maureen Ralphs  Exercises - Supine Bridge  - 3 x weekly - 3 sets - 10 reps - 2 hold - Supine Gluteal Sets  - 3 x weekly - 3 sets - 10 reps - 5 sec hold - Supine Quad Set  - 1 x daily - 3 x weekly - 3 sets - 10 reps - 5 hold - Seated Long Arc Quad  - 1 x daily - 7 x weekly - 3 sets - 10 reps - Seated Hip Adduction Squeeze with Ball  - 1 x daily - 3 x weekly - 3 sets - 10 reps - 5 hold - Seated Hip Abduction  - 1 x daily - 3 x weekly - 3 sets - 10 reps - 2 hold    PT Short Term Goals -       PT SHORT TERM GOAL #1   Title Pt will be independent with HEP in order to improve strength and balance in order to decrease fall risk and improve function at home and work.    Baseline 01/04/2021= No formal HEP in place; 12/12 no HEP in place; 05/10/2021-Patient and his father were able to report compliance with curent HEP consisting of mostly seated/reclined LE strengthening. Both verbalize no questions at this time.    Time 6    Period Weeks    Status Achieved    Target Date 02/15/21            PT LONG TERM GOAL #1     Title Patient will increase BLE gross strength by 1/2 muscle grade to improve functional strength for improved independence with potential gait, increased standing tolerance and increased ADL ability.     Baseline 01/04/2021- Patient presents with 1/5 to 3-/5 B LE strength with MMT; 12/12: goal partially met for Left knee/hip; 05/10/2021= 2-/5 bilateal Hip flex; 3+/5 bilateral Knee ext; 06/21/2021= Patient presents with 2-/5 bilateral Hip flex; 3+/5 bilateral knee ext/flex; 2-/5 left ankle DF; 0/5 right ankle- and able to increase reps and resistance with LE's. 09/15/2021- Patient technically presents with 2-/5 B hip flex/abd/add - but he is able to raise his hip up to approx 100 deg which has improved. 3+/5 Bilateral knee ext, 2-/5 left ankle and 0/5 right ankle.  12/08/2021= Patient able to lift left knee at 110 deg of hip flex; presents with 3+/5 knee ext, 2-/5 left ankle DF and 0/5 right ankle  DF, 2-/5 bilateral Hip abd in seated position.    12/6: R: knee 3+/5 ext, 2/5 flexion, left knee 3+/5 extension, 3+/5 flexion, R hip: 2+/5 hip add, 2+/5 hip ABD L hip: 4-/5 hip ABD, 3+/5 hip ADD, 3+/5 hip flexion; 06/06/2022= Patient now presents with 2-/5 right ankle DF/PF;     Time 12     Period Weeks     Status MET    Target Date 03/07/2022         PT LONG TERM GOAL #2    Title Patient will tolerate sitting unsupported demonstrating erect sitting posture for 15 minutes  with CGA to demonstrate improved back extensor strength and improved sitting tolerance.     Baseline 01/04/2021- Patient confied to sitting in lift chair or electric power chair with back support and unable to sit upright without physical assistance; 12/12: tolerates <1 minutes upright unsupported sitting. 05/10/2021=static sit with forward trunk lean  in his power wheelchair without back support x approx 3 min. 06/21/2021=Unable to assess today due to patient with acute back pain but on previous visit able to sit x 8 min without back support. 09/15/2021- on last visit- 09/13/2021- patient was able to sit unsupported x 8 min at edge of mat. 10/13/2023 - Patient was able to sit at edge of mat with varying level of assist today from SBA to min A for a total of 20 min. 12/13/2021= Patient demonstrated unsupported sitting at edge of mat for approx 20 min    Time 12     Period Weeks     Status GOAL MET    Target Date 12/08/21          PT LONG TERM GOAL #3    Title Patient will demonstrate ability to perform static standing in // bars > 2 min with Max Assist  without loss of balance and fair posture for improved overall strength for pre-gait and transfer activities.     Baseline 01/04/2021= Patient current uanble to stand- Dependent on hoyer or sit to stand lift for transfers. 05/10/2021=Not appropriate yet- Currently still dependent with all transfers using hoyer. 06/21/2021= Patient continuing now to focus on LE strengthening to prepare for  standing-unable to try today due to acute low back pain-  planning on attempting in new cert period. 09/15/2021- Patient has attempted standing 2x in past two week- max Assist of 2 people - only once was he successful to clearing his bottom from chair - Will continue to be a focus during the new certification. 12/13/2021= Patient has been limited secondary to increased overall low back pain during this certification and will require more time to focus on this goal.  12/6: not assessed this date, will assess at date when 2-3 PTs are present for assistance     Time 12     Period Weeks     Status GOAL not appropriate at this time - may attempt in future once Patient presents with improved overall LE strength.                 PT LONG TERM GOAL #4    Title Pt will improve FOTO score by 10 points or more demonstrating improved perceived functional ability     Baseline FOTO 7 on 10/17; 03/15/21: FOTO 12; 05/10/2021 06/21/2021= 1; 09/15/2021= 9; 12/13/2021= Will issue next visit 12/6: 4; 06/06/2022= Will assess next visit    Time 12     Period Weeks     Status ONGOING    Target date 11/30/2022         PT LONG TERM GOAL #5    Title Patient will perform sit to stand transfer with appropriate AD and max assist of 2 people with 75% consistency to prepare for pregait activities.     Baseline 09/15/2021= Patient unable to stand well- unable to clear his bottom off chair with Max assist of 2 persons. 12/13/2021- Goal not appropriate to try yet but will keep and roll over to next cert as shift continues to focus on transfers/standing; 4/24= Patient able to perform active ankle DF/PF with right LE and able to raise his knee  into seated march and clear floor without physical assist today - as previously unable as well as lift right knee ext to near full ROM to improve strength for eventual standing. 09/07/2022- Goal continues to not be appropriate but patient is now standing some and will keep goal active    Time 12     Period  Weeks     Status Goal still not appropriate for now but will keep active for future    Target date         PT LONG TERM GOAL #6  Title Patient will tolerate sitting unsupported demonstrating erect sitting posture for 30 minutes with CGA to demonstrate improved back extensor strength and improved sitting tolerance.   Baseline 12/13/2021= Patient demonstrated unsupported sitting at edge of mat for approx 20 min;  12/29/2021- Patient performed approx 30 min of dynamic sitting activities today. 06/06/2022= Patient demonstrated ability to sit and perform static and dynamic UE/LE movement with only Supervision.   Time 12   Period Weeks   Status GOAL MET           7.  Patient will tolerate 5 minutes or more of standing in sit to stand lift or with max assist +2 in order to indicate improved lower extremity weightbearing tolerance for progression to standing in parallel bars. Baseline: 1 minute on most recent stand 02/21/22; 06/06/2022- Patient did attempt today after complaining of right LE pain last week. He attempted 3 stands using sit to stand lift. - all over 1 min- last one approx 48 sec- stopped due to fatigue. More erect standing in lift today - still poor gluteal strength but able to activate glutes and extend hips upon command but unable to hold > 5 sec. 07/28/2022- Will attempt standing next visit but patient has been able to stand since last progress note for up to 3 min at a time at his best. 09/07/2022-  attempted standing with max +2 and patient able to stand 15-20 sec so will now  revise goal Goal status: GOAL Revised/ongoing Target date: 11/30/2022   8. Patient will tolerate sitting unsupported demonstrating ability to perform dynamic UE/LE activities for 30 minutes  independently to demonstrate ability to sit at edge of bed to eat or perform some ADL's/exercise for optimal quality of life.             Baseline: 06/06/2022- Patient able to sit and perform some UE/LE exercises but requires CGA  at times for safety- he is able to static sit for 30 min with supervision. 07/27/2022= Patient able to now sit > 30 min with Supervision only - performing static and dynamic activities. Will keep goal active to ensure patient is consistent. 09/07/2022=  Patient able to demonstrate consistent ability to sit at edge of mat during visits             Goal Status: MET             Target date: 08/29/2022        Plan     Clinical Impression Statement Treatment limited secondary to patient not feeling well. He was able to participate in some dynamic sitting exercises and resistive training but stopped secondary to straining.  Pt will continue to benefit from skilled physical therapy intervention to address impairments, improve QOL, and attain therapy goals.    Personal Factors and Comorbidities Comorbidity 3+;Time since onset of injury/illness/exacerbation    Comorbidities CVA, diabetes, Seizures    Examination-Activity Limitations Bathing;Bed Mobility;Bend;Caring for Others;Carry;Dressing;Hygiene/Grooming;Lift;Locomotion Level;Reach  Overhead;Self Feeding;Sit;Squat;Stairs;Stand;Transfers;Toileting    Examination-Participation Restrictions Cleaning;Community Activity;Driving;Laundry;Medication Management;Meal Prep;Occupation;Personal Finances;Shop;Yard Work;Volunteer    Stability/Clinical Decision Making Evolving/Moderate complexity    Rehab Potential Fair    PT Frequency 2x / week    PT Duration 12 weeks    PT Treatment/Interventions ADLs/Self Care Home Management;Cryotherapy;Electrical Stimulation;Moist Heat;Ultrasound;DME Instruction;Gait training;Stair training;Functional mobility training;Therapeutic exercise;Balance training;Patient/family education;Orthotic Fit/Training;Neuromuscular re-education;Wheelchair mobility training;Manual techniques;Passive range of motion;Dry needling;Energy conservation;Taping;Visual/perceptual remediation/compensation;Joint Manipulations    PT Next Visit Plan core  strength/motor control in short-sitting, sit to stand if able; Continue with progressive LE Strengthening. Stand activities as appropriate.     PT Home Exercise Plan No changes to HEP today    Consulted and Agree with Plan of Care Patient;Family member/caregiver    Family Member Consulted Dad            5:34 PM, 11/17/22 Louis Meckel, PT Physical Therapist - [facility]  Outpatient Physical Therapy- [facility] [phone]      11/17/2022, 5:34 PM

## 2022-11-21 ENCOUNTER — Ambulatory Visit: Payer: BC Managed Care – PPO

## 2022-11-21 ENCOUNTER — Ambulatory Visit: Payer: Medicare Other | Admitting: Occupational Therapy

## 2022-11-21 DIAGNOSIS — L8994 Pressure ulcer of unspecified site, stage 4: Secondary | ICD-10-CM | POA: Diagnosis not present

## 2022-11-21 DIAGNOSIS — I6930 Unspecified sequelae of cerebral infarction: Secondary | ICD-10-CM

## 2022-11-21 DIAGNOSIS — I6389 Other cerebral infarction: Secondary | ICD-10-CM | POA: Diagnosis not present

## 2022-11-21 DIAGNOSIS — M6281 Muscle weakness (generalized): Secondary | ICD-10-CM

## 2022-11-21 DIAGNOSIS — R262 Difficulty in walking, not elsewhere classified: Secondary | ICD-10-CM | POA: Diagnosis not present

## 2022-11-21 DIAGNOSIS — R2681 Unsteadiness on feet: Secondary | ICD-10-CM

## 2022-11-21 DIAGNOSIS — R32 Unspecified urinary incontinence: Secondary | ICD-10-CM | POA: Diagnosis not present

## 2022-11-21 DIAGNOSIS — R269 Unspecified abnormalities of gait and mobility: Secondary | ICD-10-CM

## 2022-11-21 DIAGNOSIS — R278 Other lack of coordination: Secondary | ICD-10-CM | POA: Diagnosis not present

## 2022-11-21 NOTE — Therapy (Signed)
OUTPATIENT PHYSICAL THERAPY TREATMENT   Patient Name: Paul Hall MRN: [id number] DOB:1996/03/25, 27 y.o., male Today's Date: 11/22/2022  PCP:  Cindi Carbon PROVIDER:  Lenise Herald, PA-C   PT End of Session - 11/21/22 1531     Visit Number 119    Number of Visits 128    Date for PT Re-Evaluation 11/30/22    Authorization Type [employer] COMM [employer] Medicaid    Progress Note Due on Visit 120    PT Start Time 1530    PT Stop Time 1614    PT Time Calculation (min) 44 min    Activity Tolerance Patient tolerated treatment well;Patient limited by fatigue    Behavior During Therapy WFL for tasks assessed/performed              Past Medical History:  Diagnosis Date   Diabetes mellitus (HCC)    Hypertension    Stroke (HCC)    Past Surgical History:  Procedure Laterality Date   IVC FILTER PLACEMENT ([facility] HX)     LEG SURGERY     PEG TUBE PLACEMENT     TRACHEOSTOMY     Patient Active Problem List   Diagnosis Date Noted   Sepsis due to vancomycin resistant Enterococcus species (HCC) 06/06/2019   SIRS (systemic inflammatory response syndrome) (HCC) 06/05/2019   Acute lower UTI 06/05/2019   VRE (vancomycin-resistant Enterococci) infection 06/05/2019   Anemia 06/05/2019   Skin ulcer of sacrum with necrosis of muscle (HCC)    Urinary retention    Type 2 diabetes mellitus without complication, with long-term current use of insulin (HCC)    Tachycardia    Lower extremity edema    Acute metabolic encephalopathy    Obstructive sleep apnea    Morbid obesity with BMI of 60.0-69.9, adult (HCC)    Goals of care, counseling/discussion    Palliative care encounter    Sepsis (HCC) 04/27/2019   H/O insulin dependent diabetes mellitus 04/27/2019   History of CVA with residual deficit 04/27/2019   Seizure disorder (HCC) 04/27/2019   Decubitus ulcer of sacral region, stage 4 (HCC) 04/27/2019    REFERRING DIAG: Cerebral infarction, unspecified   THERAPY DIAG:  Muscle weakness  (generalized)  Other lack of coordination  History of CVA with residual deficit  Abnormality of gait and mobility  Difficulty in walking, not elsewhere classified  Unsteadiness on feet  Rationale for Evaluation and Treatment Rehabilitation  PERTINENT HISTORY: Paul Hall is a 26yoM who presents with severe weakness, quadriparesis, altered sensorium, and visual impairment s/p critical illness and prolonged hospitalization. Pt hospitalized in October 2020 with ARDS 2/2 COVID19 infection. Pt sustained a complex and lengthy hospitalization which included tracheostomy, prolonged sedation, ECMO. In this period pt sustained CVA and SDH. Pt has now been liberated from tracheostomy and G-tube. Pt has since been hospitalized for wound infection and UTI. Pt lives with parents at home, has hospital bed and left chair, hoyer lift transfers, and power WC for mobility needs. Pt needs heavy physical assistance with ADL 2/2 BUE contractures and motor dysfunction   PRECAUTIONS: Fall  SUBJECTIVE:  Patient reports feeling much better than last week. States no new issues.     PAIN:  Are you having pain? No   TODAY'S TREATMENT:  - 11/14/2022   *patient dependently transferred from power w/c to mat table via Michiel Sites +2 assist (mother assist PT)   Therapeutic Activities:  at edge of mat Sit to stand from edge of mat x 5 with max A+2 (hand/forearm support) -  VC to lean forward  Static stand- max A +2 focusing on clearing bottom off mat x 5 trials - blocking knees and patient attempting to tighten quads and extend gluteals as much as possible. He was able to hold 20-40 sec - and every trial able to clear bottom off mat.    PATIENT EDUCATION: Education details: Progression of intensity at home with trunk strength exercises and how to grade intensity using a goniometer bubble app.   HOME EXERCISE PROGRAM:  Access Code: [email or web address] URL: [email or web address] Date: 03/23/2022 Prepared by:  Maureen Ralphs  Exercises - Supine Bridge  - 3 x weekly - 3 sets - 10 reps - 2 hold - Supine Gluteal Sets  - 3 x weekly - 3 sets - 10 reps - 5 sec hold - Supine Quad Set  - 1 x daily - 3 x weekly - 3 sets - 10 reps - 5 hold - Seated Long Arc Quad  - 1 x daily - 7 x weekly - 3 sets - 10 reps - Seated Hip Adduction Squeeze with Ball  - 1 x daily - 3 x weekly - 3 sets - 10 reps - 5 hold - Seated Hip Abduction  - 1 x daily - 3 x weekly - 3 sets - 10 reps - 2 hold    PT Short Term Goals -       PT SHORT TERM GOAL #1   Title Pt will be independent with HEP in order to improve strength and balance in order to decrease fall risk and improve function at home and work.    Baseline 01/04/2021= No formal HEP in place; 12/12 no HEP in place; 05/10/2021-Patient and his father were able to report compliance with curent HEP consisting of mostly seated/reclined LE strengthening. Both verbalize no questions at this time.    Time 6    Period Weeks    Status Achieved    Target Date 02/15/21            PT LONG TERM GOAL #1     Title Patient will increase BLE gross strength by 1/2 muscle grade to improve functional strength for improved independence with potential gait, increased standing tolerance and increased ADL ability.     Baseline 01/04/2021- Patient presents with 1/5 to 3-/5 B LE strength with MMT; 12/12: goal partially met for Left knee/hip; 05/10/2021= 2-/5 bilateal Hip flex; 3+/5 bilateral Knee ext; 06/21/2021= Patient presents with 2-/5 bilateral Hip flex; 3+/5 bilateral knee ext/flex; 2-/5 left ankle DF; 0/5 right ankle- and able to increase reps and resistance with LE's. 09/15/2021- Patient technically presents with 2-/5 B hip flex/abd/add - but he is able to raise his hip up to approx 100 deg which has improved. 3+/5 Bilateral knee ext, 2-/5 left ankle and 0/5 right ankle.  12/08/2021= Patient able to lift left knee at 110 deg of hip flex; presents with 3+/5 knee ext, 2-/5 left ankle DF and 0/5  right ankle DF, 2-/5 bilateral Hip abd in seated position.    12/6: R: knee 3+/5 ext, 2/5 flexion, left knee 3+/5 extension, 3+/5 flexion, R hip: 2+/5 hip add, 2+/5 hip ABD L hip: 4-/5 hip ABD, 3+/5 hip ADD, 3+/5 hip flexion; 06/06/2022= Patient now presents with 2-/5 right ankle DF/PF;     Time 12     Period Weeks     Status MET    Target Date 03/07/2022         PT LONG TERM GOAL #2    Title Patient  will tolerate sitting unsupported demonstrating erect sitting posture for 15 minutes with CGA to demonstrate improved back extensor strength and improved sitting tolerance.     Baseline 01/04/2021- Patient confied to sitting in lift chair or electric power chair with back support and unable to sit upright without physical assistance; 12/12: tolerates <1 minutes upright unsupported sitting. 05/10/2021=static sit with forward trunk lean  in his power wheelchair without back support x approx 3 min. 06/21/2021=Unable to assess today due to patient with acute back pain but on previous visit able to sit x 8 min without back support. 09/15/2021- on last visit- 09/13/2021- patient was able to sit unsupported x 8 min at edge of mat. 10/13/2023 - Patient was able to sit at edge of mat with varying level of assist today from SBA to min A for a total of 20 min. 12/13/2021= Patient demonstrated unsupported sitting at edge of mat for approx 20 min    Time 12     Period Weeks     Status GOAL MET    Target Date 12/08/21          PT LONG TERM GOAL #3    Title Patient will demonstrate ability to perform static standing in // bars > 2 min with Max Assist  without loss of balance and fair posture for improved overall strength for pre-gait and transfer activities.     Baseline 01/04/2021= Patient current uanble to stand- Dependent on hoyer or sit to stand lift for transfers. 05/10/2021=Not appropriate yet- Currently still dependent with all transfers using hoyer. 06/21/2021= Patient continuing now to focus on LE strengthening to prepare  for standing-unable to try today due to acute low back pain-  planning on attempting in new cert period. 09/15/2021- Patient has attempted standing 2x in past two week- max Assist of 2 people - only once was he successful to clearing his bottom from chair - Will continue to be a focus during the new certification. 12/13/2021= Patient has been limited secondary to increased overall low back pain during this certification and will require more time to focus on this goal.  12/6: not assessed this date, will assess at date when 2-3 PTs are present for assistance     Time 12     Period Weeks     Status GOAL not appropriate at this time - may attempt in future once Patient presents with improved overall LE strength.                 PT LONG TERM GOAL #4    Title Pt will improve FOTO score by 10 points or more demonstrating improved perceived functional ability     Baseline FOTO 7 on 10/17; 03/15/21: FOTO 12; 05/10/2021 06/21/2021= 1; 09/15/2021= 9; 12/13/2021= Will issue next visit 12/6: 4; 06/06/2022= Will assess next visit    Time 12     Period Weeks     Status ONGOING    Target date 11/30/2022         PT LONG TERM GOAL #5    Title Patient will perform sit to stand transfer with appropriate AD and max assist of 2 people with 75% consistency to prepare for pregait activities.     Baseline 09/15/2021= Patient unable to stand well- unable to clear his bottom off chair with Max assist of 2 persons. 12/13/2021- Goal not appropriate to try yet but will keep and roll over to next cert as shift continues to focus on transfers/standing; 4/24= Patient able to perform active  ankle DF/PF with right LE and able to raise his knee into seated march and clear floor without physical assist today - as previously unable as well as lift right knee ext to near full ROM to improve strength for eventual standing. 09/07/2022- Goal continues to not be appropriate but patient is now standing some and will keep goal active    Time 12      Period Weeks     Status Goal still not appropriate for now but will keep active for future    Target date         PT LONG TERM GOAL #6  Title Patient will tolerate sitting unsupported demonstrating erect sitting posture for 30 minutes with CGA to demonstrate improved back extensor strength and improved sitting tolerance.   Baseline 12/13/2021= Patient demonstrated unsupported sitting at edge of mat for approx 20 min;  12/29/2021- Patient performed approx 30 min of dynamic sitting activities today. 06/06/2022= Patient demonstrated ability to sit and perform static and dynamic UE/LE movement with only Supervision.   Time 12   Period Weeks   Status GOAL MET           7.  Patient will tolerate 5 minutes or more of standing in sit to stand lift or with max assist +2 in order to indicate improved lower extremity weightbearing tolerance for progression to standing in parallel bars. Baseline: 1 minute on most recent stand 02/21/22; 06/06/2022- Patient did attempt today after complaining of right LE pain last week. He attempted 3 stands using sit to stand lift. - all over 1 min- last one approx 48 sec- stopped due to fatigue. More erect standing in lift today - still poor gluteal strength but able to activate glutes and extend hips upon command but unable to hold > 5 sec. 07/28/2022- Will attempt standing next visit but patient has been able to stand since last progress note for up to 3 min at a time at his best. 09/07/2022-  attempted standing with max +2 and patient able to stand 15-20 sec so will now  revise goal Goal status: GOAL Revised/ongoing Target date: 11/30/2022   8. Patient will tolerate sitting unsupported demonstrating ability to perform dynamic UE/LE activities for 30 minutes  independently to demonstrate ability to sit at edge of bed to eat or perform some ADL's/exercise for optimal quality of life.             Baseline: 06/06/2022- Patient able to sit and perform some UE/LE exercises but  requires CGA at times for safety- he is able to static sit for 30 min with supervision. 07/27/2022= Patient able to now sit > 30 min with Supervision only - performing static and dynamic activities. Will keep goal active to ensure patient is consistent. 09/07/2022=  Patient able to demonstrate consistent ability to sit at edge of mat during visits             Goal Status: MET             Target date: 08/29/2022        Plan     Clinical Impression Statement Patient possibly experienced his best effort at standing since initial hospitalization. He was able to perform better- clearing bottom off mat on each trial today and no complaints of pain. He performed the most trials of standing today with less rest breaks.  Pt will continue to benefit from skilled physical therapy intervention to address impairments, improve QOL, and attain therapy goals.  Personal Factors and Comorbidities Comorbidity 3+;Time since onset of injury/illness/exacerbation    Comorbidities CVA, diabetes, Seizures    Examination-Activity Limitations Bathing;Bed Mobility;Bend;Caring for Others;Carry;Dressing;Hygiene/Grooming;Lift;Locomotion Level;Reach Overhead;Self Feeding;Sit;Squat;Stairs;Stand;Transfers;Toileting    Examination-Participation Restrictions Cleaning;Community Activity;Driving;Laundry;Medication Management;Meal Prep;Occupation;Personal Finances;Shop;Yard Work;Volunteer    Stability/Clinical Decision Making Evolving/Moderate complexity    Rehab Potential Fair    PT Frequency 2x / week    PT Duration 12 weeks    PT Treatment/Interventions ADLs/Self Care Home Management;Cryotherapy;Electrical Stimulation;Moist Heat;Ultrasound;DME Instruction;Gait training;Stair training;Functional mobility training;Therapeutic exercise;Balance training;Patient/family education;Orthotic Fit/Training;Neuromuscular re-education;Wheelchair mobility training;Manual techniques;Passive range of motion;Dry needling;Energy  conservation;Taping;Visual/perceptual remediation/compensation;Joint Manipulations    PT Next Visit Plan core strength/motor control in short-sitting, sit to stand if able; Continue with progressive LE Strengthening. Stand activities as appropriate.     PT Home Exercise Plan No changes to HEP today    Consulted and Agree with Plan of Care Patient;Family member/caregiver    Family Member Consulted Dad            2:05 PM, 11/22/22 Louis Meckel, PT Physical Therapist - [facility]  Outpatient Physical Therapy- [facility] [phone]      11/22/2022, 2:05 PM

## 2022-11-21 NOTE — Therapy (Addendum)
Occupational Therapy Neuro Treatment Note  Patient Name: Paul Hall MRN: [id number] DOB:[birth year], 27 y.o., male Today's Date: 11/21/2022  PCP: Dr. Sherwood Gambler REFERRING PROVIDER: Dr. Sherwood Gambler   OT End of Session - 11/21/22 1720     Visit Number 51    Number of Visits 84    Authorization Type 09/26/2022    OT Start Time 1618    OT Stop Time 1710    OT Time Calculation (min) 52 min    Activity Tolerance Patient tolerated treatment well    Behavior During Therapy WFL for tasks assessed/performed                 Past Medical History:  Diagnosis Date   Diabetes mellitus (HCC)    Hypertension    Stroke [facility])    Past Surgical History:  Procedure Laterality Date   IVC FILTER PLACEMENT ([facility] HX)     LEG SURGERY     PEG TUBE PLACEMENT     TRACHEOSTOMY     Patient Active Problem List   Diagnosis Date Noted   Sepsis due to vancomycin resistant Enterococcus species (HCC) 06/06/2019   SIRS (systemic inflammatory response syndrome) (HCC) 06/05/2019   Acute lower UTI 06/05/2019   VRE (vancomycin-resistant Enterococci) infection 06/05/2019   Anemia 06/05/2019   Skin ulcer of sacrum with necrosis of muscle (HCC)    Urinary retention    Type 2 diabetes mellitus without complication, with long-term current use of insulin (HCC)    Tachycardia    Lower extremity edema    Acute metabolic encephalopathy    Obstructive sleep apnea    Morbid obesity with BMI of 60.0-69.9, adult (HCC)    Goals of care, counseling/discussion    Palliative care encounter    Sepsis (HCC) 04/27/2019   H/O insulin dependent diabetes mellitus 04/27/2019   History of CVA with residual deficit 04/27/2019   Seizure disorder (HCC) 04/27/2019   Decubitus ulcer of sacral region, stage 4 (HCC) 04/27/2019   ONSET DATE: 01/2019  REFERRING DIAG: CVA/COVID-19  THERAPY DIAG:  Muscle weakness (generalized)  Rationale for Evaluation and Treatment Rehabilitation  SUBJECTIVE:   SUBJECTIVE  STATEMENT:  Pt. Reports doing well today. Pt accompanied by: self and family member  PERTINENT HISTORY:  Pt. is a 27 y.o. male who was diagnosed with COVID-19, and CVA with resultant quadriplegia. Pt. Was then hospitalized with VRE UTI. PMHx includes: urinary retention, seizure disorder, obstructive sleep disorder, DM Type II, Morbid obesity.   PRECAUTIONS: None  WEIGHT BEARING RESTRICTIONS No  PAIN:  Are you having pain? Discomfort in the right 3rd, and 4th digits.  FALLS: Has patient fallen in last 6 months? No  LIVING ENVIRONMENT: Lives with: lives with their family Lives in: House/apartment Stairs: No Level Entry Has following equipment at home: Wheelchair (power) and hoyer lift, sit to stand lift  PLOF: Independent  PATIENT GOALS: To be able to engage in more daily care tasks.  OBJECTIVE:   HAND DOMINANCE: Right  ADLs:  Transfers/ambulation related to ADLs: Eating: Pt. reports being able to hold standard utensils, and is starting to engage more in self-feeding tasks, hand to mouth patterns. Pt. Reports that he does as much as he can with the task, and family assists  with the remainder of the task. Grooming: Pt. Is able to initiate holding an electric toothbrush, and brush his teeth. Family assists LB Dressing: Total Assist UE dressing: Pt. is now able to reach up to actively assist with grasping , and pulling his gown  down. Toileting:  Total Assist Bathing: MaxA UB, Total assist LB Tub Shower transfers: N/A Equipment: See above    IADLs: Shopping: Relies on family to assist Light housekeeping: Total Assist Meal Prep: Total Assist Community mobility:   Medication management:  Total Assist  Financial management: N/A Handwriting: Not legible: Pt. Is able to hold a pen with the left hand, and initiate marking the page. Pt.'s eye glasses were not available  MOBILITY STATUS:  Power w/c  POSTURE COMMENTS:  Pt. Requires position changes in his power  w/c  ACTIVITY TOLERANCE: Activity tolerance:  Fair  FUNCTIONAL OUTCOME MEASURES: FOTO: 40  TR score: 45  05/25/2022:   FOTO: 50 TR score: 45  07/20/22: FOTO 55  UPPER EXTREMITY ROM     Active ROM Right eval Right 03/21/2022 Right 05/25/2022 R  07/20/22 Right  07/25/22 Right 08/15/2022 Right 09/26/2022 Right Left eval Left  03/21/2022 Left 05/25/2022 L  07/20/22 Left 07/25/2022 Left  08/15/2022 Left  09/26/2022 Left  11/16/2022  Shoulder flexion 106 scaption 105  Scaption 62 flexion 110 scaption 100  105 105 105 119 123 125 130  136 138 130  Shoulder abduction 114 90 97 100  105 117 120 110 115 115 115  118 121 125  Shoulder adduction                  Shoulder extension                  Shoulder internal rotation                  Shoulder external rotation                  Elbow flexion 120(130) 145 145 140  144 140 142 135(135) 145 145 145  145    Elbow extension -45(-35) -22(-35) -28(-22)  -32(-21) -32(-28) -28(-26) -28(-28) -27(-18) -21(-20) -20(-16)  -25(-10) -23(-10) -21(-10) -20(-18)  Wrist flexion                  Wrist extension -30(10) -25(10) -10(30)  -10(20) -10(25) -20(40) -30(10) 10(50) 19(50) 28(50)  30(50) 35(55) 36(55) 36(60)  Wrist ulnar deviation                  Wrist radial deviation                  Wrist pronation                  Wrist supination   Limited by flexor tone        Limited by flexor tone       (Blank rows = not tested)  UPPER EXTREMITY MMT:     Right eval Right 03/21/2022 Right  05/25/2022 R 07/20/22 Right 08/25/2022 Right 09/26/2022 Right 11/16/2022 Left eval Left 03/21/2022 Left  05/25/2022 L 07/20/22 Left  08/15/2022 Left 09/26/2022 Left 11/16/2022  Shoulder flexion 3-/5 scaption 3-/5 3-/5 3+/5 3+/5 3+/5 3/5 3-/5 3/5 3+/5 4+/5 4+/5 4+/5 4+/5  Shoulder abduction 3-/5 3-/5 3-/5 4/5 4/5 4/5 4/5 3-/5 3-/5 4-/5 4+/5 4+/5 4+5 4+/5  Shoulder adduction                Shoulder extension                Shoulder internal rotation                 Shoulder external rotation  Elbow flexion 3/5 3/5 3+/5 4/5 4+/5 4+/5 4+/5 3/5 3+/5 4-/5 4+/5 5/5 5/5 5/5  Elbow extension 2-/5 2/5 3-/5 4+/5 4+/5 4+/5 4+/5 2-/5 2/5  4+/5 4+/5 4+/5   Wrist flexion                Wrist extension 2-/5 2-/5 2/5 2/5 2/5 2/5 2/5 2/5 2+/5 3-/5  3-/5 3-/5 3-/5  Wrist ulnar deviation                Wrist radial deviation                Wrist pronation                Wrist supination                (Blank rows = not tested)   HAND FUNCTION:  Grip strength: Right: 0 lbs; Left: 0 lbs and Lateral pinch: Right: 5 lbs, Left: 2 lbs  03/21/2022: Lateral pinch: Right: 3.5 lbs, Left: 2 lbs  07/20/22: Grip strength: Right: 3 lbs; Left: 4 lbs and Lateral pinch: Right: 5 lbs, Left: 3 lbs  08/15/2022:  Grip strength: Right: 0 lbs; Left: 5 lbs and Lateral pinch: Right: 2 lbs, Left: 4 lbs  09/26/2022  Grip strength: Right: 0 lbs; Left: 8 lbs and Lateral pinch: Right: 2 lbs, Left: 5 lbs  11/16/2022  Grip strength: Right: 0 lbs; Left: 8 lbs   Bilateral digit PIP/DIP flexion contractures with MP hyperextension with attempts for AROM. Pt. is able to tolerate AROM to the bilateral digits at the initial evaluation however, has a history of pain in the digits.  COORDINATION: Eval: Pt. is unable to grasp 9-hole test pegs. Pt. is able to initiate grasping larger pegs, and is able to hold a pen in the left hand.  07/20/22: 2 min 36 seconds to remove 9 pegs from 9 hole peg test - cues to locate pegs 2/2 low vision. Pt. is able to initiate grasping larger pegs on R hand and is able to hold a pen in the left hand.  08/15/2022:      Vision, and sensation limiting accuracy of 9 hole peg test results. Pt. Was able to grasp and remove vertical pegs intermittently with cues.    SENSATION: Light touch: Impaired   EDEMA:  N/A  MUSCLE TONE: BUE flexor Spasticity  COGNITION Overall cognitive status: Continue to assess in functional context  VISION:    Subjective report: Pt. was not wearing glasses at the time of the initial eval.  Baseline vision: Vision is very limited. Wears glasses all the time Visual history: History of impaired vision following CVA. Pt. Has received treatment through the [facility] low vision rehabilitation program.   VISION ASSESSMENT: Impaired To be further assessed in functional context  PERCEPTION: Impaired   PRAXIS: Impaired: motor planning  OBSERVATIONS:  Pt reports being on Tramadol   TODAY'S TREATMENT   Therapeutic Ex.:  Emphasis was placed on slow prolonged gentle stretching for RUE, forearm supination, wrist extension, wrist extension, digit MP, PIP, and DIP extension following moist heat. ROM was performed to decrease stiffness, and prepare the RUE for functional use. ROM was performed  following 5 min. For moist heat to the right elbow, forearm, wrist, and digits.   Manual Therapy:  Pt. Tolerated soft tissue massage to the right digit lateral, and sagittal bands 2/2 stiffness, and discomfort in preparation and independent of ROM/there. Ex.      PATIENT EDUCATION: Education details: Doctor, general practice  and fork positioning for self feeding Person educated: Patient and Parent Education method: Explanation, Demonstration, Tactile cues, and Verbal cues Education comprehension: verbalized understanding, returned demonstration, verbal cues required, and needs further education  HOME EXERCISE PROGRAM:  Continue ongoing assessment, and continue to provide as needed.   GOALS: Goals reviewed with patient? Yes  SHORT TERM GOALS: Target date: 11/07/2022   To assess splint fit, and make appropriate adjustments to promote good skin integrity through the palmar surface of the bilateral hands.  Baseline: 05/25/22: Goal currently met, however ongoing as needs to assess splint fit arise. 03/23/2022: Pt. is wearing splints a couple of hours at night bilateral resting hand splints. 03/21/2022: Pt. is wearing splints  a couple of hours at night bilateral resting hand splints. Goal status: Deferred   LONG TERM GOALS: Target date: 02/08/2023    FOTO score will Improve by 2 points for Pt. perceived improvement with the assessment specific ADL/IADL tasks.  Baseline: 11/16/2022: 52 09/27/2022: 51 07/20/2022: FOTO 55 05/25/2022: FOTO score: 50,  TR score: 45 Eval: FOTO score: 40,  TR score: 45 Goal status: Achieved   2.   Pt. will independently perform oral care for 100% of the task after complete set-up. Baseline: 11/16/2022: Pt. Completes 90% of the task with complete set-up. 09/26/2022: Completes 90% of the task, proximal assist at times required at the elbow.  08/15/2022: Completes 90% of the task, proximal assist at times required at the elbow. 07/20/22: completes 90% of task, limited by shoulder flexion. 05/25/2022:  Pt. Is able to initiate and perform oral care for approximately 90% of the task. Complete set-up required. Assi needed only for the very back teeth. 03/23/2022: Pt. Is able to initiate and perform oral care for approximately 75% of the task. Pt. Requires assist at proximally at the elbow for through oral care. 03/21/2022: Pt. Is able to initiate and perform oral care for approximately 75% of the task. Pt. Requires assist at proximally at the elbow for through oral care. Eval: Pt. is able to initiate using an electric toothbrush. Pt. requires assist for set-up, and assist for thoroughness, and as he Pt. fatigues. Goal status: Ongoing  3.  Pt. Will be modified independence with self-feeding for 75% using a swivel spoon, and universal fork after complete set-up Baseline: 11/16/2022: 100% with finger foods, Pt. Is initiating with a swivel spoon, and universal cuff for fork use, however requires assist for consistency, and accuracy.  09/26/2022: 90% using the spoon, assist at time with the forearm motion, and grip on the utensils. 100% for finger foods.  08/15/2022:  90% using the spoon, assit at time with the forearm  motion, and grip on the utensils. 100% for finger foods-independent with set-up- unsupervised.  07/20/22: self-feeds cereal using spoon 90% of task. 05/25/2022: Pt. Is able to use a spoon to scoop cereal when feeding himself cereal 85% of the time. Pt. Is able to feed himself snack/finger foods 100% of the time. Pt. Continues to work on consistency of  stabilizing a cup/mug when drinking. Pt. Is able to grasp a water bottle with assist initially, with assist tapering off as he drinks.03/23/2022: Pt. Is able to perform scooping cereal for 75% of the time. Pt. required assist, and support at the left elbow, and Pt. Presents with limited forearm supination when using the spoon, and bringing it towards his mouth. Pt. Is able to use a fork to spear items, and perform the hand to mouth pattern.  03/21/2022: Pt. Is able to perform scooping  cereal for 75% of the time. Pt. required assist, and support at the left elbow, and Pt. presents with limited forearm supination when using the spoon, and bringing it towards his mouth. Pt. Is able to use a fork to spear items, and perform the hand to mouth pattern.  Eval: Pt. is able to hold standard standard utensils. Pt. Performs as much of the task as he, can and has assistance for the remainder. Goal status:  Revised  4.  Pt. Will improve grasp patterns and consistently grasp 1/4" objects for ADL, and IADL tasks.  Baseline: 11/16/2022: Continues to grasp 1/4" pegs with min a + visual cues, consistently grasping 1/2" objects with visual cues 09/26/2022: Continues to grasp 1/4" pegs with min a + visual cues, consistently grasping 1/2" objects with visual cues. 08/15/2022: grasps 1/4" pegs with min a + visual cues, consistently grasping 1/2" objects with visual cues. 07/20/22: grasps 1/4" pegs with min a + visual cues, consistently grasping 1/2" objects with visual cues. 05/25/2022: Pt. Is working on improving consistency of grasping 1/2" objects with visual cues.  03/23/2022: Pt. Is able  to grasp 1" objects consistently,and continues to work on the hand patterns needed to grasp 1/2" objects.03/21/2022: Pt. Is able to grasp 1" objects consistently,and continues to work on the hand patterns needed to grasp 1/2" objects. Eval: Pt. is able to grasp 1" objects intermittently using a lateral grasp pattern. Goal status: Progressing  5.  Pt. will independently write his name legibly with letter sizes under 1". Baseline: 11/16/2022: Pt. Continues to be able to write name with smaller letter size. Pt. Is signing name with a computer styus. Pt. Requires visual cues, and assist 09/26/2022: Pt. Continues to be able to write name with smaller letter size. Pt. Is signing name with a computer styus. Pt. Requires visual cues, and assist. 08/15/2022: Pt. Is able to write name with smaller letter size. Pt. Is signing name with a computer styus. Pt. Requires visual cues, and assist. 07/20/22: stabilizing assist to write name with 75% legibility with 2" letters. 05/25/2022: Pt. to continue to work towards formulating grasp patterns in preparation for grasping a large width pen. Pt. Requires visual cues. 03/23/2022: Pt. Is able to write his name with modA, however has difficulty with formulating letter sizes less than 2" in size with 50% legibility for the 3 letters of his name.03/21/2022: Pt. Is able to write his name with modA, however has difficulty with formulating letter sizes less than 2" in size with 50% legibility for the 3 letters of his name. Eval: Pt is able to hold a thin marker with his left hand, and formulate a line, and initiate a circular pattern (Pt. without glasses today) Goal status: Progressing  6. Pt. Will reach up to comb/brush his hair with minA.  Baseline: 11/16/2022: Pt. Is able to use a hair pick for the left side of his head using his left hand. Pt. Presents with difficulty reaching to the right side of his head.09/26/2022: Pt. Is able to use a hair pick for the left side of his head using  his left hand. Pt. Presents with difficulty reaching to the right side of his head. 5/13/202: 75% using a hair pick, assist to the right side of the head. 07/20/22: reaches 75% of head, assist for far R side of head, fatigues quickly. 05/25/2022: Pt. Is able to reach up with the left hand to the left side, top, and back of his head. 03/23/2022: Pt. is now able to more  consistently initiate reaching up to his head with his left hand in preparation for haircare12/18/2023: Pt. is now able to more consistently initiate reaching up to his head with his left hand in preparation for haircare. Eval: Pt. is able to initiate reaching up for hair care with a long handled brush, however is unable to sustain UE's in elevation to perform the task.     Goal status: Ongoing  7. Pt. Will independently navigate the w/c through his environment with minA with visual scanning, and hand placement on the controls.  Baseline: 11/16/2022: Deferred 2/2 vision 09/26/2022: Pt. Is now navigating his w/c ouside the home, and around his block. 08/15/2022: Pt. Requires minA to setup hand on controls and MIN cues to navigate the w/c in wide spaces, requires MIN - MOD cues to navigate the w/c through more narrow doorways, and tighter turns - varies based on good vs bad vision days.07/20/22: Pt. Requires minA to setup hand on controls and MIN cues to navigate the w/c in wide spaces, requires MIN - MOD cues to navigate the w/c through more narrow doorways, and tighter turns - varies based on good vs bad vision days. 05/25/2022: Pt. Requires minA  and  cues to navigate the w/c in wide spaces, and requires Mod cues to navigate the w/c through more narrow doorways, and tighter turns. Pt. Requires max cues for scanning through the environment, and moderate cues for hand placement on the controls.     Goal status: Deferred 2/2 vision    8. Pt. Will improve bilateral grip strength to be able to independently grasp, and pull up blankets, and linens.    Baseline: 11/16/2022: R: 0 L: 8# Pt. Is more consistently grasping his blankets and attempting to pull them up. 09/26/2022: R: 0  L: 8# Pt. is attempting to grasp, and pull blankets up more, using mostly the left hand. 08/15/2022: Pt. has difficulty securely holding, and pulling up blankets, and linens.    Goal status: Ongoing  9. Pt. will consistently actively control the releasing  of blankets, covers, and linens from his hands once they are in the desired position over him. Baseline: 11/16/2022: Pt. continues to improve with actively releasing blankets form his hands. 09/26/2022: Pt. is improving with actively releasing blankets form his hands. 08/15/2022: Pt. has difficulty with controlled releasing of blankets/linens from his grasp.     Goal status: Ongoing    10. Pt. Will improve bilateral UE strength by 2 MM grades to assist with ADLs, and IADLs  Baseline: 11/16/2022: Right: shoulder flexion: 3/5, abduction: 4/5, elbow flexion: 4+/5 , extension: 4+/5 wrist extension: 2/5; Left: shoulder flexion: 4+/5, abduction: 4+/5, elbow flexion: 5/5 , extension:  wrist extension: 3-/5 Right: shoulder flexion: 3+/5, abduction: 4/5, elbow flexion: 4+/5 , extension: 4+/5 wrist extension: 2/5; Left: shoulder flexion: 4+/5, abduction: 4+/5, elbow flexion: 5/5 , extension: 4+/5 wrist extension: 3-/5  11. Pt. Will improve right wrist extension by 10 degrees to initiate reaching for items in preparation for ADLs. Baseline: 11/16/2022: -30(10)    Goal status: New   ASSESSMENT: CLINICAL IMPRESSION:  Pt. presents with limited ROM, tightness, in the right 2/2  flexor tone, and tightness in the RUE, forearm, wrist, and digits.  Pt. presents with discomfort in the right 3rd, and 4th digit MPs, and PIPs. Pt. tolerated Moist heat, ROM, and stretching well. Pt. continues to work on improving BUE functioning, ROM, strength, motor control, [facility] skills, and visual compensatory strategies to be able to independently pull up, and  release blankets/covers, improve writing legibility, improve self-care including oral care, and utensil use during self-feeding tasks, and maximize independence with ADLs, and IADLs.     PERFORMANCE DEFICITS in functional skills including ADLs, IADLs, coordination, dexterity, proprioception, ROM, strength, pain, FMC, GMC, decreased knowledge of use of DME, and UE functional use, cognitive skills including safety awareness, and psychosocial skills including coping strategies, environmental adaptation, habits, and routines and behaviors.   IMPAIRMENTS are limiting patient from ADLs, IADLs, leisure, and social participation.   COMORBIDITIES may have co-morbidities  that affects occupational performance. Patient will benefit from skilled OT to address above impairments and improve overall function.  MODIFICATION OR ASSISTANCE TO COMPLETE EVALUATION: Maximum or significant modification of tasks or assist is necessary to complete an evaluation.  OT OCCUPATIONAL PROFILE AND HISTORY: Comprehensive assessment: Review of records and extensive additional review of physical, cognitive, psychosocial history related to current functional performance.  CLINICAL DECISION MAKING: High - multiple treatment options, significant modification of task necessary  REHAB POTENTIAL: Fair    EVALUATION COMPLEXITY: High    PLAN: OT FREQUENCY: 2 x's a week  OT DURATION:12 weeks  PLANNED INTERVENTIONS: self care/ADL training, therapeutic exercise, therapeutic activity, neuromuscular re-education, manual therapy, passive range of motion, patient/family education, and cognitive remediation/compensation RECOMMENDED OTHER SERVICES: PT  CONSULTED AND AGREED WITH PLAN OF CARE: Patient and family member/caregiver  PLAN FOR NEXT SESSION: Initiate treatment  Olegario Messier, MS, OTR/L   11/21/22, 5:25 PM

## 2022-11-23 ENCOUNTER — Ambulatory Visit: Payer: Medicare Other

## 2022-11-23 ENCOUNTER — Ambulatory Visit: Payer: BC Managed Care – PPO | Admitting: Occupational Therapy

## 2022-11-23 DIAGNOSIS — R278 Other lack of coordination: Secondary | ICD-10-CM

## 2022-11-23 DIAGNOSIS — M6281 Muscle weakness (generalized): Secondary | ICD-10-CM

## 2022-11-23 DIAGNOSIS — I6930 Unspecified sequelae of cerebral infarction: Secondary | ICD-10-CM

## 2022-11-23 DIAGNOSIS — R2681 Unsteadiness on feet: Secondary | ICD-10-CM | POA: Diagnosis not present

## 2022-11-23 DIAGNOSIS — R262 Difficulty in walking, not elsewhere classified: Secondary | ICD-10-CM | POA: Diagnosis not present

## 2022-11-23 DIAGNOSIS — R269 Unspecified abnormalities of gait and mobility: Secondary | ICD-10-CM | POA: Diagnosis not present

## 2022-11-23 NOTE — Therapy (Signed)
Occupational Therapy Neuro Treatment Note  Patient Name: Paul Hall MRN: [id number] DOB:29-Sep-1995, 27 y.o., male Today's Date: 11/23/2022  PCP: Dr. Sherwood Gambler REFERRING PROVIDER: Dr. Sherwood Gambler   OT End of Session - 11/23/22 1711     Visit Number 52    Number of Visits 84    Date for OT Re-Evaluation 02/08/23    Authorization Type 09/26/2022    OT Start Time 1615    OT Stop Time 1700    OT Time Calculation (min) 45 min    Activity Tolerance Patient tolerated treatment well    Behavior During Therapy WFL for tasks assessed/performed                 Past Medical History:  Diagnosis Date   Diabetes mellitus (HCC)    Hypertension    Stroke [facility])    Past Surgical History:  Procedure Laterality Date   IVC FILTER PLACEMENT ([facility] HX)     LEG SURGERY     PEG TUBE PLACEMENT     TRACHEOSTOMY     Patient Active Problem List   Diagnosis Date Noted   Sepsis due to vancomycin resistant Enterococcus species (HCC) 06/06/2019   SIRS (systemic inflammatory response syndrome) (HCC) 06/05/2019   Acute lower UTI 06/05/2019   VRE (vancomycin-resistant Enterococci) infection 06/05/2019   Anemia 06/05/2019   Skin ulcer of sacrum with necrosis of muscle (HCC)    Urinary retention    Type 2 diabetes mellitus without complication, with long-term current use of insulin (HCC)    Tachycardia    Lower extremity edema    Acute metabolic encephalopathy    Obstructive sleep apnea    Morbid obesity with BMI of 60.0-69.9, adult (HCC)    Goals of care, counseling/discussion    Palliative care encounter    Sepsis (HCC) 04/27/2019   H/O insulin dependent diabetes mellitus 04/27/2019   History of CVA with residual deficit 04/27/2019   Seizure disorder (HCC) 04/27/2019   Decubitus ulcer of sacral region, stage 4 (HCC) 04/27/2019   ONSET DATE: 01/2019  REFERRING DIAG: CVA/COVID-19  THERAPY DIAG:  Muscle weakness (generalized)  Rationale for Evaluation and Treatment  Rehabilitation  SUBJECTIVE:   SUBJECTIVE STATEMENT:  Pt. reports doing well today. Pt. CAP case manager accompanied the Pt., and his mother to the therapy appointments this afternoon. Pt accompanied by: self and family member  PERTINENT HISTORY:  Pt. is a 27 y.o. male who was diagnosed with COVID-19, and CVA with resultant quadriplegia. Pt. Was then hospitalized with VRE UTI. PMHx includes: urinary retention, seizure disorder, obstructive sleep disorder, DM Type II, Morbid obesity.   PRECAUTIONS: None  WEIGHT BEARING RESTRICTIONS No  PAIN:  Are you having pain? Discomfort in the right 3rd, and 4th digits.  FALLS: Has patient fallen in last 6 months? No  LIVING ENVIRONMENT: Lives with: lives with their family Lives in: House/apartment Stairs: No Level Entry Has following equipment at home: Wheelchair (power) and hoyer lift, sit to stand lift  PLOF: Independent  PATIENT GOALS: To be able to engage in more daily care tasks.  OBJECTIVE:   HAND DOMINANCE: Right  ADLs:  Transfers/ambulation related to ADLs: Eating: Pt. reports being able to hold standard utensils, and is starting to engage more in self-feeding tasks, hand to mouth patterns. Pt. Reports that he does as much as he can with the task, and family assists  with the remainder of the task. Grooming: Pt. Is able to initiate holding an electric toothbrush, and brush his teeth. Family  assists LB Dressing: Total Assist UE dressing: Pt. is now able to reach up to actively assist with grasping , and pulling his gown down. Toileting:  Total Assist Bathing: MaxA UB, Total assist LB Tub Shower transfers: N/A Equipment: See above    IADLs: Shopping: Relies on family to assist Light housekeeping: Total Assist Meal Prep: Total Assist Community mobility:   Medication management:  Total Assist  Financial management: N/A Handwriting: Not legible: Pt. Is able to hold a pen with the left hand, and initiate marking the page.  Pt.'s eye glasses were not available  MOBILITY STATUS:  Power w/c  POSTURE COMMENTS:  Pt. Requires position changes in his power w/c  ACTIVITY TOLERANCE: Activity tolerance:  Fair  FUNCTIONAL OUTCOME MEASURES: FOTO: 40  TR score: 45  05/25/2022:   FOTO: 50 TR score: 45  07/20/22: FOTO 55  UPPER EXTREMITY ROM     Active ROM Right eval Right 03/21/2022 Right 05/25/2022 R  07/20/22 Right  07/25/22 Right 08/15/2022 Right 09/26/2022 Right Left eval Left  03/21/2022 Left 05/25/2022 L  07/20/22 Left 07/25/2022 Left  08/15/2022 Left  09/26/2022 Left  11/16/2022  Shoulder flexion 106 scaption 105  Scaption 62 flexion 110 scaption 100  105 105 105 119 123 125 130  136 138 130  Shoulder abduction 114 90 97 100  105 117 120 110 115 115 115  118 121 125  Shoulder adduction                  Shoulder extension                  Shoulder internal rotation                  Shoulder external rotation                  Elbow flexion 120(130) 145 145 140  144 140 142 135(135) 145 145 145  145    Elbow extension -45(-35) -22(-35) -28(-22)  -32(-21) -32(-28) -28(-26) -28(-28) -27(-18) -21(-20) -20(-16)  -25(-10) -23(-10) -21(-10) -20(-18)  Wrist flexion                  Wrist extension -30(10) -25(10) -10(30)  -10(20) -10(25) -20(40) -30(10) 10(50) 19(50) 28(50)  30(50) 35(55) 36(55) 36(60)  Wrist ulnar deviation                  Wrist radial deviation                  Wrist pronation                  Wrist supination   Limited by flexor tone        Limited by flexor tone       (Blank rows = not tested)  UPPER EXTREMITY MMT:     Right eval Right 03/21/2022 Right  05/25/2022 R 07/20/22 Right 08/25/2022 Right 09/26/2022 Right 11/16/2022 Left eval Left 03/21/2022 Left  05/25/2022 L 07/20/22 Left  08/15/2022 Left 09/26/2022 Left 11/16/2022  Shoulder flexion 3-/5 scaption 3-/5 3-/5 3+/5 3+/5 3+/5 3/5 3-/5 3/5 3+/5 4+/5 4+/5 4+/5 4+/5  Shoulder abduction 3-/5 3-/5 3-/5 4/5 4/5 4/5 4/5 3-/5 3-/5 4-/5  4+/5 4+/5 4+5 4+/5  Shoulder adduction                Shoulder extension                Shoulder internal rotation  Shoulder external rotation                Elbow flexion 3/5 3/5 3+/5 4/5 4+/5 4+/5 4+/5 3/5 3+/5 4-/5 4+/5 5/5 5/5 5/5  Elbow extension 2-/5 2/5 3-/5 4+/5 4+/5 4+/5 4+/5 2-/5 2/5  4+/5 4+/5 4+/5   Wrist flexion                Wrist extension 2-/5 2-/5 2/5 2/5 2/5 2/5 2/5 2/5 2+/5 3-/5  3-/5 3-/5 3-/5  Wrist ulnar deviation                Wrist radial deviation                Wrist pronation                Wrist supination                (Blank rows = not tested)   HAND FUNCTION:  Grip strength: Right: 0 lbs; Left: 0 lbs and Lateral pinch: Right: 5 lbs, Left: 2 lbs  03/21/2022: Lateral pinch: Right: 3.5 lbs, Left: 2 lbs  07/20/22: Grip strength: Right: 3 lbs; Left: 4 lbs and Lateral pinch: Right: 5 lbs, Left: 3 lbs  08/15/2022:  Grip strength: Right: 0 lbs; Left: 5 lbs and Lateral pinch: Right: 2 lbs, Left: 4 lbs  09/26/2022  Grip strength: Right: 0 lbs; Left: 8 lbs and Lateral pinch: Right: 2 lbs, Left: 5 lbs  11/16/2022  Grip strength: Right: 0 lbs; Left: 8 lbs   Bilateral digit PIP/DIP flexion contractures with MP hyperextension with attempts for AROM. Pt. is able to tolerate AROM to the bilateral digits at the initial evaluation however, has a history of pain in the digits.  COORDINATION: Eval: Pt. is unable to grasp 9-hole test pegs. Pt. is able to initiate grasping larger pegs, and is able to hold a pen in the left hand.  07/20/22: 2 min 36 seconds to remove 9 pegs from 9 hole peg test - cues to locate pegs 2/2 low vision. Pt. is able to initiate grasping larger pegs on R hand and is able to hold a pen in the left hand.  08/15/2022:      Vision, and sensation limiting accuracy of 9 hole peg test results. Pt. Was able to grasp and remove vertical pegs intermittently with cues.    SENSATION: Light touch: Impaired   EDEMA:  N/A  MUSCLE  TONE: BUE flexor Spasticity  COGNITION Overall cognitive status: Continue to assess in functional context  VISION:   Subjective report: Pt. was not wearing glasses at the time of the initial eval.  Baseline vision: Vision is very limited. Wears glasses all the time Visual history: History of impaired vision following CVA. Pt. Has received treatment through the [facility] low vision rehabilitation program.   VISION ASSESSMENT: Impaired To be further assessed in functional context  PERCEPTION: Impaired   PRAXIS: Impaired: motor planning  OBSERVATIONS:  Pt reports being on Tramadol   TODAY'S TREATMENT   Therapeutic Ex.:  Emphasis was placed on slow prolonged gentle stretching for RUE, forearm supination, wrist extension, wrist extension, digit MP, PIP, and DIP extension following moist heat. ROM was performed to decrease stiffness, and prepare the RUE for functional use. ROM was performed  following 5 min. For moist heat to the right elbow, forearm, wrist, and digits.   Manual Therapy:  Pt. Tolerated soft tissue massage to the right digit lateral, and sagittal bands 2/2 stiffness, and discomfort  in preparation and independent of ROM/there. Ex.      PATIENT EDUCATION: Education details: Doctor, general practice and fork positioning for self feeding Person educated: Patient and Parent Education method: Explanation, Demonstration, Tactile cues, and Verbal cues Education comprehension: verbalized understanding, returned demonstration, verbal cues required, and needs further education  HOME EXERCISE PROGRAM:  Continue ongoing assessment, and continue to provide as needed.   GOALS: Goals reviewed with patient? Yes  SHORT TERM GOALS: Target date: 11/07/2022   To assess splint fit, and make appropriate adjustments to promote good skin integrity through the palmar surface of the bilateral hands.  Baseline: 05/25/22: Goal currently met, however ongoing as needs to assess splint fit arise.  03/23/2022: Pt. is wearing splints a couple of hours at night bilateral resting hand splints. 03/21/2022: Pt. is wearing splints a couple of hours at night bilateral resting hand splints. Goal status: Deferred   LONG TERM GOALS: Target date: 02/08/2023    FOTO score will Improve by 2 points for Pt. perceived improvement with the assessment specific ADL/IADL tasks.  Baseline: 11/16/2022: 52 09/27/2022: 51 07/20/2022: FOTO 55 05/25/2022: FOTO score: 50,  TR score: 45 Eval: FOTO score: 40,  TR score: 45 Goal status: Achieved   2.   Pt. will independently perform oral care for 100% of the task after complete set-up. Baseline: 11/16/2022: Pt. Completes 90% of the task with complete set-up. 09/26/2022: Completes 90% of the task, proximal assist at times required at the elbow.  08/15/2022: Completes 90% of the task, proximal assist at times required at the elbow. 07/20/22: completes 90% of task, limited by shoulder flexion. 05/25/2022:  Pt. Is able to initiate and perform oral care for approximately 90% of the task. Complete set-up required. Assi needed only for the very back teeth. 03/23/2022: Pt. Is able to initiate and perform oral care for approximately 75% of the task. Pt. Requires assist at proximally at the elbow for through oral care. 03/21/2022: Pt. Is able to initiate and perform oral care for approximately 75% of the task. Pt. Requires assist at proximally at the elbow for through oral care. Eval: Pt. is able to initiate using an electric toothbrush. Pt. requires assist for set-up, and assist for thoroughness, and as he Pt. fatigues. Goal status: Ongoing  3.  Pt. Will be modified independence with self-feeding for 75% using a swivel spoon, and universal fork after complete set-up Baseline: 11/16/2022: 100% with finger foods, Pt. Is initiating with a swivel spoon, and universal cuff for fork use, however requires assist for consistency, and accuracy.  09/26/2022: 90% using the spoon, assist at time with the  forearm motion, and grip on the utensils. 100% for finger foods.  08/15/2022:  90% using the spoon, assit at time with the forearm motion, and grip on the utensils. 100% for finger foods-independent with set-up- unsupervised.  07/20/22: self-feeds cereal using spoon 90% of task. 05/25/2022: Pt. Is able to use a spoon to scoop cereal when feeding himself cereal 85% of the time. Pt. Is able to feed himself snack/finger foods 100% of the time. Pt. Continues to work on consistency of  stabilizing a cup/mug when drinking. Pt. Is able to grasp a water bottle with assist initially, with assist tapering off as he drinks.03/23/2022: Pt. Is able to perform scooping cereal for 75% of the time. Pt. required assist, and support at the left elbow, and Pt. Presents with limited forearm supination when using the spoon, and bringing it towards his mouth. Pt. Is able to use a fork  to spear items, and perform the hand to mouth pattern.  03/21/2022: Pt. Is able to perform scooping cereal for 75% of the time. Pt. required assist, and support at the left elbow, and Pt. presents with limited forearm supination when using the spoon, and bringing it towards his mouth. Pt. Is able to use a fork to spear items, and perform the hand to mouth pattern.  Eval: Pt. is able to hold standard standard utensils. Pt. Performs as much of the task as he, can and has assistance for the remainder. Goal status:  Revised  4.  Pt. Will improve grasp patterns and consistently grasp 1/4" objects for ADL, and IADL tasks.  Baseline: 11/16/2022: Continues to grasp 1/4" pegs with min a + visual cues, consistently grasping 1/2" objects with visual cues 09/26/2022: Continues to grasp 1/4" pegs with min a + visual cues, consistently grasping 1/2" objects with visual cues. 08/15/2022: grasps 1/4" pegs with min a + visual cues, consistently grasping 1/2" objects with visual cues. 07/20/22: grasps 1/4" pegs with min a + visual cues, consistently grasping 1/2" objects with  visual cues. 05/25/2022: Pt. Is working on improving consistency of grasping 1/2" objects with visual cues.  03/23/2022: Pt. Is able to grasp 1" objects consistently,and continues to work on the hand patterns needed to grasp 1/2" objects.03/21/2022: Pt. Is able to grasp 1" objects consistently,and continues to work on the hand patterns needed to grasp 1/2" objects. Eval: Pt. is able to grasp 1" objects intermittently using a lateral grasp pattern. Goal status: Progressing  5.  Pt. will independently write his name legibly with letter sizes under 1". Baseline: 11/16/2022: Pt. Continues to be able to write name with smaller letter size. Pt. Is signing name with a computer styus. Pt. Requires visual cues, and assist 09/26/2022: Pt. Continues to be able to write name with smaller letter size. Pt. Is signing name with a computer styus. Pt. Requires visual cues, and assist. 08/15/2022: Pt. Is able to write name with smaller letter size. Pt. Is signing name with a computer styus. Pt. Requires visual cues, and assist. 07/20/22: stabilizing assist to write name with 75% legibility with 2" letters. 05/25/2022: Pt. to continue to work towards formulating grasp patterns in preparation for grasping a large width pen. Pt. Requires visual cues. 03/23/2022: Pt. Is able to write his name with modA, however has difficulty with formulating letter sizes less than 2" in size with 50% legibility for the 3 letters of his name.03/21/2022: Pt. Is able to write his name with modA, however has difficulty with formulating letter sizes less than 2" in size with 50% legibility for the 3 letters of his name. Eval: Pt is able to hold a thin marker with his left hand, and formulate a line, and initiate a circular pattern (Pt. without glasses today) Goal status: Progressing  6. Pt. Will reach up to comb/brush his hair with minA.  Baseline: 11/16/2022: Pt. Is able to use a hair pick for the left side of his head using his left hand. Pt. Presents  with difficulty reaching to the right side of his head.09/26/2022: Pt. Is able to use a hair pick for the left side of his head using his left hand. Pt. Presents with difficulty reaching to the right side of his head. 5/13/202: 75% using a hair pick, assist to the right side of the head. 07/20/22: reaches 75% of head, assist for far R side of head, fatigues quickly. 05/25/2022: Pt. Is able to reach up with the left  hand to the left side, top, and back of his head. 03/23/2022: Pt. is now able to more consistently initiate reaching up to his head with his left hand in preparation for haircare12/18/2023: Pt. is now able to more consistently initiate reaching up to his head with his left hand in preparation for haircare. Eval: Pt. is able to initiate reaching up for hair care with a long handled brush, however is unable to sustain UE's in elevation to perform the task.     Goal status: Ongoing  7. Pt. Will independently navigate the w/c through his environment with minA with visual scanning, and hand placement on the controls.  Baseline: 11/16/2022: Deferred 2/2 vision 09/26/2022: Pt. Is now navigating his w/c ouside the home, and around his block. 08/15/2022: Pt. Requires minA to setup hand on controls and MIN cues to navigate the w/c in wide spaces, requires MIN - MOD cues to navigate the w/c through more narrow doorways, and tighter turns - varies based on good vs bad vision days.07/20/22: Pt. Requires minA to setup hand on controls and MIN cues to navigate the w/c in wide spaces, requires MIN - MOD cues to navigate the w/c through more narrow doorways, and tighter turns - varies based on good vs bad vision days. 05/25/2022: Pt. Requires minA  and  cues to navigate the w/c in wide spaces, and requires Mod cues to navigate the w/c through more narrow doorways, and tighter turns. Pt. Requires max cues for scanning through the environment, and moderate cues for hand placement on the controls.     Goal status: Deferred 2/2  vision    8. Pt. Will improve bilateral grip strength to be able to independently grasp, and pull up blankets, and linens.   Baseline: 11/16/2022: R: 0 L: 8# Pt. Is more consistently grasping his blankets and attempting to pull them up. 09/26/2022: R: 0  L: 8# Pt. is attempting to grasp, and pull blankets up more, using mostly the left hand. 08/15/2022: Pt. has difficulty securely holding, and pulling up blankets, and linens.    Goal status: Ongoing  9. Pt. will consistently actively control the releasing  of blankets, covers, and linens from his hands once they are in the desired position over him. Baseline: 11/16/2022: Pt. continues to improve with actively releasing blankets form his hands. 09/26/2022: Pt. is improving with actively releasing blankets form his hands. 08/15/2022: Pt. has difficulty with controlled releasing of blankets/linens from his grasp.     Goal status: Ongoing    10. Pt. Will improve bilateral UE strength by 2 MM grades to assist with ADLs, and IADLs  Baseline: 11/16/2022: Right: shoulder flexion: 3/5, abduction: 4/5, elbow flexion: 4+/5 , extension: 4+/5 wrist extension: 2/5; Left: shoulder flexion: 4+/5, abduction: 4+/5, elbow flexion: 5/5 , extension:  wrist extension: 3-/5 Right: shoulder flexion: 3+/5, abduction: 4/5, elbow flexion: 4+/5 , extension: 4+/5 wrist extension: 2/5; Left: shoulder flexion: 4+/5, abduction: 4+/5, elbow flexion: 5/5 , extension: 4+/5 wrist extension: 3-/5  11. Pt. Will improve right wrist extension by 10 degrees to initiate reaching for items in preparation for ADLs. Baseline: 11/16/2022: -30(10)    Goal status: New   ASSESSMENT: CLINICAL IMPRESSION:  Pt. Continues to present with limited ROM, tightness, in the right 2/2  flexor tone, and tightness in the RUE, forearm, wrist, and digits.  Pt. presents with discomfort in the right 3rd, and 4th digit MPs, and PIPs. Pt. tolerated Moist heat, ROM, and stretching well. Pt. continues to work on  improving  BUE functioning, ROM, strength, motor control, FMC skills, and visual compensatory strategies to be able to independently pull up, and release blankets/covers, improve writing legibility, improve self-care including oral care, and utensil use during self-feeding tasks, and maximize independence with ADLs, and IADLs.     PERFORMANCE DEFICITS in functional skills including ADLs, IADLs, coordination, dexterity, proprioception, ROM, strength, pain, FMC, GMC, decreased knowledge of use of DME, and UE functional use, cognitive skills including safety awareness, and psychosocial skills including coping strategies, environmental adaptation, habits, and routines and behaviors.   IMPAIRMENTS are limiting patient from ADLs, IADLs, leisure, and social participation.   COMORBIDITIES may have co-morbidities  that affects occupational performance. Patient will benefit from skilled OT to address above impairments and improve overall function.  MODIFICATION OR ASSISTANCE TO COMPLETE EVALUATION: Maximum or significant modification of tasks or assist is necessary to complete an evaluation.  OT OCCUPATIONAL PROFILE AND HISTORY: Comprehensive assessment: Review of records and extensive additional review of physical, cognitive, psychosocial history related to current functional performance.  CLINICAL DECISION MAKING: High - multiple treatment options, significant modification of task necessary  REHAB POTENTIAL: Fair    EVALUATION COMPLEXITY: High    PLAN: OT FREQUENCY: 2 x's a week  OT DURATION:12 weeks  PLANNED INTERVENTIONS: self care/ADL training, therapeutic exercise, therapeutic activity, neuromuscular re-education, manual therapy, passive range of motion, patient/family education, and cognitive remediation/compensation RECOMMENDED OTHER SERVICES: PT  CONSULTED AND AGREED WITH PLAN OF CARE: Patient and family member/caregiver  PLAN FOR NEXT SESSION: Initiate treatment  Olegario Messier,  MS, OTR/L   11/23/22, 5:18 PM

## 2022-11-23 NOTE — Therapy (Signed)
OUTPATIENT PHYSICAL THERAPY TREATMENT/Physical Therapy Progress Note   Dates of reporting period  7/10/224   to   11/23/2022  Patient Name: Paul Hall MRN: [id number] DOB:1995-05-09, 27 y.o., male Today's Date: 11/23/2022  PCP:  Cindi Carbon PROVIDER:  Lenise Herald, PA-C   PT End of Session - 11/23/22 1630     Visit Number 120    Number of Visits 128    Date for PT Re-Evaluation 11/30/22    Authorization Type [employer]/ [place] [employer]    Progress Note Due on Visit 130    PT Start Time 1531    PT Stop Time 1612    PT Time Calculation (min) 41 min    Activity Tolerance Patient tolerated treatment well;Patient limited by fatigue    Behavior During Therapy WFL for tasks assessed/performed              Past Medical History:  Diagnosis Date   Diabetes mellitus (HCC)    Hypertension    Stroke (HCC)    Past Surgical History:  Procedure Laterality Date   IVC FILTER PLACEMENT ([facility] HX)     LEG SURGERY     PEG TUBE PLACEMENT     TRACHEOSTOMY     Patient Active Problem List   Diagnosis Date Noted   Sepsis due to vancomycin resistant Enterococcus species (HCC) 06/06/2019   SIRS (systemic inflammatory response syndrome) (HCC) 06/05/2019   Acute lower UTI 06/05/2019   VRE (vancomycin-resistant Enterococci) infection 06/05/2019   Anemia 06/05/2019   Skin ulcer of sacrum with necrosis of muscle (HCC)    Urinary retention    Type 2 diabetes mellitus without complication, with long-term current use of insulin (HCC)    Tachycardia    Lower extremity edema    Acute metabolic encephalopathy    Obstructive sleep apnea    Morbid obesity with BMI of 60.0-69.9, adult (HCC)    Goals of care, counseling/discussion    Palliative care encounter    Sepsis (HCC) 04/27/2019   H/O insulin dependent diabetes mellitus 04/27/2019   History of CVA with residual deficit 04/27/2019   Seizure disorder (HCC) 04/27/2019   Decubitus ulcer of sacral region, stage 4 (HCC) 04/27/2019     REFERRING DIAG: Cerebral infarction, unspecified   THERAPY DIAG:  Muscle weakness (generalized)  Other lack of coordination  History of CVA with residual deficit  Abnormality of gait and mobility  Rationale for Evaluation and Treatment Rehabilitation  PERTINENT HISTORY: Jaylene Wiley is a 26yoM who presents with severe weakness, quadriparesis, altered sensorium, and visual impairment s/p critical illness and prolonged hospitalization. Pt hospitalized in October 2020 with ARDS 2/2 COVID19 infection. Pt sustained a complex and lengthy hospitalization which included tracheostomy, prolonged sedation, ECMO. In this period pt sustained CVA and SDH. Pt has now been liberated from tracheostomy and G-tube. Pt has since been hospitalized for wound infection and UTI. Pt lives with parents at home, has hospital bed and left chair, hoyer lift transfers, and power WC for mobility needs. Pt needs heavy physical assistance with ADL 2/2 BUE contractures and motor dysfunction   PRECAUTIONS: Fall  SUBJECTIVE:  Patient reports feeling much better than last week. States no new issues.     PAIN:  Are you having pain? No   TODAY'S TREATMENT:  - 11/23/2022   *patient dependently transferred from power w/c to mat table via Michiel Sites +2 assist (mother assist PT)   Therapeutic Activities:  at edge of mat  Static sitting while carrying on short conversation with  mom and PT Dynamic with ant/post weight shift- attempting to place his hands on mat- Increased difficulty with right UE due to tone  Sit to stand from edge of mat x3 with max A+2 (hand/forearm support)  Static stand- max A +2 focusing on clearing bottom off mat x 3 trials - blocking knees and patient instructed to tighten quads and extend gluteals as much as possible. He was able to hold 20-50 sec - much improved clearance vs. All previous visits with avg of 30+ sec hold.    PATIENT EDUCATION: Education details: Progression of intensity at home  with trunk strength exercises and how to grade intensity using a goniometer bubble app.   HOME EXERCISE PROGRAM:  Access Code: [email or web address] URL: [email or web address] Date: 03/23/2022 Prepared by: Maureen Ralphs  Exercises - Supine Bridge  - 3 x weekly - 3 sets - 10 reps - 2 hold - Supine Gluteal Sets  - 3 x weekly - 3 sets - 10 reps - 5 sec hold - Supine Quad Set  - 1 x daily - 3 x weekly - 3 sets - 10 reps - 5 hold - Seated Long Arc Quad  - 1 x daily - 7 x weekly - 3 sets - 10 reps - Seated Hip Adduction Squeeze with Ball  - 1 x daily - 3 x weekly - 3 sets - 10 reps - 5 hold - Seated Hip Abduction  - 1 x daily - 3 x weekly - 3 sets - 10 reps - 2 hold    PT Short Term Goals -       PT SHORT TERM GOAL #1   Title Pt will be independent with HEP in order to improve strength and balance in order to decrease fall risk and improve function at home and work.    Baseline 01/04/2021= No formal HEP in place; 12/12 no HEP in place; 05/10/2021-Patient and his father were able to report compliance with curent HEP consisting of mostly seated/reclined LE strengthening. Both verbalize no questions at this time.    Time 6    Period Weeks    Status Achieved    Target Date 02/15/21            PT LONG TERM GOAL #1     Title Patient will increase BLE gross strength by 1/2 muscle grade to improve functional strength for improved independence with potential gait, increased standing tolerance and increased ADL ability.     Baseline 01/04/2021- Patient presents with 1/5 to 3-/5 B LE strength with MMT; 12/12: goal partially met for Left knee/hip; 05/10/2021= 2-/5 bilateal Hip flex; 3+/5 bilateral Knee ext; 06/21/2021= Patient presents with 2-/5 bilateral Hip flex; 3+/5 bilateral knee ext/flex; 2-/5 left ankle DF; 0/5 right ankle- and able to increase reps and resistance with LE's. 09/15/2021- Patient technically presents with 2-/5 B hip flex/abd/add - but he is able to raise his hip up to approx 100  deg which has improved. 3+/5 Bilateral knee ext, 2-/5 left ankle and 0/5 right ankle.  12/08/2021= Patient able to lift left knee at 110 deg of hip flex; presents with 3+/5 knee ext, 2-/5 left ankle DF and 0/5 right ankle DF, 2-/5 bilateral Hip abd in seated position.    12/6: R: knee 3+/5 ext, 2/5 flexion, left knee 3+/5 extension, 3+/5 flexion, R hip: 2+/5 hip add, 2+/5 hip ABD L hip: 4-/5 hip ABD, 3+/5 hip ADD, 3+/5 hip flexion; 06/06/2022= Patient now presents with 2-/5 right ankle DF/PF;     Time  12     Period Weeks     Status MET    Target Date 03/07/2022         PT LONG TERM GOAL #2    Title Patient will tolerate sitting unsupported demonstrating erect sitting posture for 15 minutes with CGA to demonstrate improved back extensor strength and improved sitting tolerance.     Baseline 01/04/2021- Patient confied to sitting in lift chair or electric power chair with back support and unable to sit upright without physical assistance; 12/12: tolerates <1 minutes upright unsupported sitting. 05/10/2021=static sit with forward trunk lean  in his power wheelchair without back support x approx 3 min. 06/21/2021=Unable to assess today due to patient with acute back pain but on previous visit able to sit x 8 min without back support. 09/15/2021- on last visit- 09/13/2021- patient was able to sit unsupported x 8 min at edge of mat. 10/13/2023 - Patient was able to sit at edge of mat with varying level of assist today from SBA to min A for a total of 20 min. 12/13/2021= Patient demonstrated unsupported sitting at edge of mat for approx 20 min    Time 12     Period Weeks     Status GOAL MET    Target Date 12/08/21          PT LONG TERM GOAL #3    Title Patient will demonstrate ability to perform static standing in // bars > 2 min with Max Assist  without loss of balance and fair posture for improved overall strength for pre-gait and transfer activities.     Baseline 01/04/2021= Patient current uanble to stand-  Dependent on hoyer or sit to stand lift for transfers. 05/10/2021=Not appropriate yet- Currently still dependent with all transfers using hoyer. 06/21/2021= Patient continuing now to focus on LE strengthening to prepare for standing-unable to try today due to acute low back pain-  planning on attempting in new cert period. 09/15/2021- Patient has attempted standing 2x in past two week- max Assist of 2 people - only once was he successful to clearing his bottom from chair - Will continue to be a focus during the new certification. 12/13/2021= Patient has been limited secondary to increased overall low back pain during this certification and will require more time to focus on this goal.  12/6: not assessed this date, will assess at date when 2-3 PTs are present for assistance     Time 12     Period Weeks     Status GOAL not appropriate at this time - may attempt in future once Patient presents with improved overall LE strength.                 PT LONG TERM GOAL #4    Title Pt will improve FOTO score by 10 points or more demonstrating improved perceived functional ability     Baseline FOTO 7 on 10/17; 03/15/21: FOTO 12; 05/10/2021 06/21/2021= 1; 09/15/2021= 9; 12/13/2021= Will issue next visit 12/6: 4; 06/06/2022= Will assess next visit; Goal not appropriate as patient is not ambulatory.     Time 12     Period Weeks     Status WITHDRAWN    Target date 11/30/2022         PT LONG TERM GOAL #5    Title Patient will perform sit to stand transfer with appropriate AD and max assist of 2 people with 75% consistency to prepare for pregait activities.     Baseline 09/15/2021= Patient  unable to stand well- unable to clear his bottom off chair with Max assist of 2 persons. 12/13/2021- Goal not appropriate to try yet but will keep and roll over to next cert as shift continues to focus on transfers/standing; 4/24= Patient able to perform active ankle DF/PF with right LE and able to raise his knee into seated march and clear floor  without physical assist today - as previously unable as well as lift right knee ext to near full ROM to improve strength for eventual standing. 09/07/2022- Goal continues to not be appropriate but patient is now standing some and will keep goal active; 11/23/2022= patient is now performing sit to stand with max A +2 and now able to clear his bottom off mat.     Time 12     Period Weeks     Status PROGRESSING    Target date         PT LONG TERM GOAL #6  Title Patient will tolerate sitting unsupported demonstrating erect sitting posture for 30 minutes with CGA to demonstrate improved back extensor strength and improved sitting tolerance.   Baseline 12/13/2021= Patient demonstrated unsupported sitting at edge of mat for approx 20 min;  12/29/2021- Patient performed approx 30 min of dynamic sitting activities today. 06/06/2022= Patient demonstrated ability to sit and perform static and dynamic UE/LE movement with only Supervision.   Time 12   Period Weeks   Status GOAL MET           7.  Patient will tolerate 5 minutes or more of standing in sit to stand lift or with max assist +2 in order to indicate improved lower extremity weightbearing tolerance for progression to standing in parallel bars. Baseline: 1 minute on most recent stand 02/21/22; 06/06/2022- Patient did attempt today after complaining of right LE pain last week. He attempted 3 stands using sit to stand lift. - all over 1 min- last one approx 48 sec- stopped due to fatigue. More erect standing in lift today - still poor gluteal strength but able to activate glutes and extend hips upon command but unable to hold > 5 sec. 07/28/2022- Will attempt standing next visit but patient has been able to stand since last progress note for up to 3 min at a time at his best. 09/07/2022-  attempted standing with max +2 and patient able to stand 15-20 sec so will now  revise goal; 11/23/2022- Patient at his best between last 2 visits was able to stand approx 50 sec  with max A+2. Goal status: Ongoing  Target date: 11/30/2022   8. Patient will tolerate sitting unsupported demonstrating ability to perform dynamic UE/LE activities for 30 minutes  independently to demonstrate ability to sit at edge of bed to eat or perform some ADL's/exercise for optimal quality of life.             Baseline: 06/06/2022- Patient able to sit and perform some UE/LE exercises but requires CGA at times for safety- he is able to static sit for 30 min with supervision. 07/27/2022= Patient able to now sit > 30 min with Supervision only - performing static and dynamic activities. Will keep goal active to ensure patient is consistent. 09/07/2022=  Patient able to demonstrate consistent ability to sit at edge of mat during visits             Goal Status: MET             Target date: 08/29/2022  Plan     Clinical Impression Statement  Patient continues to demo progress overall- continues to work on static standing and able to stand with more gluteal clearance again today and increased quat activation. His confidence is growing and he has been more consistent with PT visits contributing to recent progress. Patient's condition has the potential to improve in response to therapy. Maximum improvement is yet to be obtained. The anticipated improvement is attainable and reasonable in a generally predictable time.   Pt will continue to benefit from skilled physical therapy intervention to address impairments, improve QOL, and attain therapy goals.    Personal Factors and Comorbidities Comorbidity 3+;Time since onset of injury/illness/exacerbation    Comorbidities CVA, diabetes, Seizures    Examination-Activity Limitations Bathing;Bed Mobility;Bend;Caring for Others;Carry;Dressing;Hygiene/Grooming;Lift;Locomotion Level;Reach Overhead;Self Feeding;Sit;Squat;Stairs;Stand;Transfers;Toileting    Examination-Participation Restrictions Cleaning;Community Activity;Driving;Laundry;Medication Management;Meal  Prep;Occupation;Personal Finances;Shop;Yard Work;Volunteer    Stability/Clinical Decision Making Evolving/Moderate complexity    Rehab Potential Fair    PT Frequency 2x / week    PT Duration 12 weeks    PT Treatment/Interventions ADLs/Self Care Home Management;Cryotherapy;Electrical Stimulation;Moist Heat;Ultrasound;DME Instruction;Gait training;Stair training;Functional mobility training;Therapeutic exercise;Balance training;Patient/family education;Orthotic Fit/Training;Neuromuscular re-education;Wheelchair mobility training;Manual techniques;Passive range of motion;Dry needling;Energy conservation;Taping;Visual/perceptual remediation/compensation;Joint Manipulations    PT Next Visit Plan core strength/motor control in short-sitting, sit to stand if able; Continue with progressive LE Strengthening. Stand activities as appropriate.     PT Home Exercise Plan No changes to HEP today    Consulted and Agree with Plan of Care Patient;Family member/caregiver    Family Member Consulted Dad            4:33 PM, 11/23/22 Louis Meckel, PT Physical Therapist - [facility]  Outpatient Physical Therapy- [facility] [phone]      11/23/2022, 4:33 PM

## 2022-11-28 ENCOUNTER — Ambulatory Visit: Payer: BC Managed Care – PPO | Admitting: Occupational Therapy

## 2022-11-28 ENCOUNTER — Ambulatory Visit: Payer: Medicare Other

## 2022-11-28 DIAGNOSIS — R262 Difficulty in walking, not elsewhere classified: Secondary | ICD-10-CM | POA: Diagnosis not present

## 2022-11-28 DIAGNOSIS — M6281 Muscle weakness (generalized): Secondary | ICD-10-CM | POA: Diagnosis not present

## 2022-11-28 DIAGNOSIS — R269 Unspecified abnormalities of gait and mobility: Secondary | ICD-10-CM

## 2022-11-28 DIAGNOSIS — I6930 Unspecified sequelae of cerebral infarction: Secondary | ICD-10-CM | POA: Diagnosis not present

## 2022-11-28 DIAGNOSIS — R2681 Unsteadiness on feet: Secondary | ICD-10-CM | POA: Diagnosis not present

## 2022-11-28 DIAGNOSIS — R278 Other lack of coordination: Secondary | ICD-10-CM

## 2022-11-28 NOTE — Therapy (Signed)
OUTPATIENT PHYSICAL THERAPY TREATMENT    Patient Name: Paul Hall MRN: [id number] DOB:24-Jun-1995, 27 y.o., male Today's Date: 11/28/2022  PCP:  Cindi Carbon PROVIDER:  Lenise Herald, PA-C   PT End of Session - 11/28/22 1710     Visit Number 121    Number of Visits 128    Date for PT Re-Evaluation 11/30/22    Authorization Type [employer] COMM Pro/ [place] Medicaid    Progress Note Due on Visit 130    PT Start Time 1535    PT Stop Time 1615    PT Time Calculation (min) 40 min    Activity Tolerance Patient tolerated treatment well;Patient limited by fatigue    Behavior During Therapy WFL for tasks assessed/performed               Past Medical History:  Diagnosis Date   Diabetes mellitus (HCC)    Hypertension    Stroke (HCC)    Past Surgical History:  Procedure Laterality Date   IVC FILTER PLACEMENT ([facility] HX)     LEG SURGERY     PEG TUBE PLACEMENT     TRACHEOSTOMY     Patient Active Problem List   Diagnosis Date Noted   Sepsis due to vancomycin resistant Enterococcus species (HCC) 06/06/2019   SIRS (systemic inflammatory response syndrome) (HCC) 06/05/2019   Acute lower UTI 06/05/2019   VRE (vancomycin-resistant Enterococci) infection 06/05/2019   Anemia 06/05/2019   Skin ulcer of sacrum with necrosis of muscle (HCC)    Urinary retention    Type 2 diabetes mellitus without complication, with long-term current use of insulin (HCC)    Tachycardia    Lower extremity edema    Acute metabolic encephalopathy    Obstructive sleep apnea    Morbid obesity with BMI of 60.0-69.9, adult (HCC)    Goals of care, counseling/discussion    Palliative care encounter    Sepsis (HCC) 04/27/2019   H/O insulin dependent diabetes mellitus 04/27/2019   History of CVA with residual deficit 04/27/2019   Seizure disorder (HCC) 04/27/2019   Decubitus ulcer of sacral region, stage 4 (HCC) 04/27/2019    REFERRING DIAG: Cerebral infarction, unspecified   THERAPY DIAG:  Muscle  weakness (generalized)  Other lack of coordination  History of CVA with residual deficit  Abnormality of gait and mobility  Difficulty in walking, not elsewhere classified  Unsteadiness on feet  Rationale for Evaluation and Treatment Rehabilitation  PERTINENT HISTORY: Paul Hall is a 26yoM who presents with severe weakness, quadriparesis, altered sensorium, and visual impairment s/p critical illness and prolonged hospitalization. Pt hospitalized in October 2020 with ARDS 2/2 COVID19 infection. Pt sustained a complex and lengthy hospitalization which included tracheostomy, prolonged sedation, ECMO. In this period pt sustained CVA and SDH. Pt has now been liberated from tracheostomy and G-tube. Pt has since been hospitalized for wound infection and UTI. Pt lives with parents at home, has hospital bed and left chair, hoyer lift transfers, and power WC for mobility needs. Pt needs heavy physical assistance with ADL 2/2 BUE contractures and motor dysfunction   PRECAUTIONS: Fall  SUBJECTIVE:  Patient reports feeling much better than last week. States no new issues.     PAIN:  Are you having pain? No   TODAY'S TREATMENT:  - 11/28/22   *patient dependently transferred from power w/c to mat table via [facility] +2 assist (mother assist PT)   Therex:  Supine on mat table  Lower trunk rotation- 20 reps each direction Hip ER/IR - 20 reps  each direction Hamstring/gastroc stretch x 30 sec x 3 Bridging x 12 reps with 5 sec hold  SLR x 10 reps (AROM)  Hip add with ball squeeze with 5 sec hold x 10 AAROM Heel slides x 10 reps each    PATIENT EDUCATION: Education details: Progression of intensity at home with trunk strength exercises and how to grade intensity using a goniometer bubble app.   HOME EXERCISE PROGRAM:  Access Code: [email or web address] URL: [email or web address] Date: 03/23/2022 Prepared by: Maureen Ralphs  Exercises - Supine Bridge  - 3 x weekly - 3 sets - 10 reps  - 2 hold - Supine Gluteal Sets  - 3 x weekly - 3 sets - 10 reps - 5 sec hold - Supine Quad Set  - 1 x daily - 3 x weekly - 3 sets - 10 reps - 5 hold - Seated Long Arc Quad  - 1 x daily - 7 x weekly - 3 sets - 10 reps - Seated Hip Adduction Squeeze with Ball  - 1 x daily - 3 x weekly - 3 sets - 10 reps - 5 hold - Seated Hip Abduction  - 1 x daily - 3 x weekly - 3 sets - 10 reps - 2 hold    PT Short Term Goals -       PT SHORT TERM GOAL #1   Title Pt will be independent with HEP in order to improve strength and balance in order to decrease fall risk and improve function at home and work.    Baseline 01/04/2021= No formal HEP in place; 12/12 no HEP in place; 05/10/2021-Patient and his father were able to report compliance with curent HEP consisting of mostly seated/reclined LE strengthening. Both verbalize no questions at this time.    Time 6    Period Weeks    Status Achieved    Target Date 02/15/21            PT LONG TERM GOAL #1     Title Patient will increase BLE gross strength by 1/2 muscle grade to improve functional strength for improved independence with potential gait, increased standing tolerance and increased ADL ability.     Baseline 01/04/2021- Patient presents with 1/5 to 3-/5 B LE strength with MMT; 12/12: goal partially met for Left knee/hip; 05/10/2021= 2-/5 bilateal Hip flex; 3+/5 bilateral Knee ext; 06/21/2021= Patient presents with 2-/5 bilateral Hip flex; 3+/5 bilateral knee ext/flex; 2-/5 left ankle DF; 0/5 right ankle- and able to increase reps and resistance with LE's. 09/15/2021- Patient technically presents with 2-/5 B hip flex/abd/add - but he is able to raise his hip up to approx 100 deg which has improved. 3+/5 Bilateral knee ext, 2-/5 left ankle and 0/5 right ankle.  12/08/2021= Patient able to lift left knee at 110 deg of hip flex; presents with 3+/5 knee ext, 2-/5 left ankle DF and 0/5 right ankle DF, 2-/5 bilateral Hip abd in seated position.    12/6: R: knee 3+/5  ext, 2/5 flexion, left knee 3+/5 extension, 3+/5 flexion, R hip: 2+/5 hip add, 2+/5 hip ABD L hip: 4-/5 hip ABD, 3+/5 hip ADD, 3+/5 hip flexion; 06/06/2022= Patient now presents with 2-/5 right ankle DF/PF;     Time 12     Period Weeks     Status MET    Target Date 03/07/2022         PT LONG TERM GOAL #2    Title Patient will tolerate sitting unsupported demonstrating erect sitting posture for 15  minutes with CGA to demonstrate improved back extensor strength and improved sitting tolerance.     Baseline 01/04/2021- Patient confied to sitting in lift chair or electric power chair with back support and unable to sit upright without physical assistance; 12/12: tolerates <1 minutes upright unsupported sitting. 05/10/2021=static sit with forward trunk lean  in his power wheelchair without back support x approx 3 min. 06/21/2021=Unable to assess today due to patient with acute back pain but on previous visit able to sit x 8 min without back support. 09/15/2021- on last visit- 09/13/2021- patient was able to sit unsupported x 8 min at edge of mat. 10/13/2023 - Patient was able to sit at edge of mat with varying level of assist today from SBA to min A for a total of 20 min. 12/13/2021= Patient demonstrated unsupported sitting at edge of mat for approx 20 min    Time 12     Period Weeks     Status GOAL MET    Target Date 12/08/21          PT LONG TERM GOAL #3    Title Patient will demonstrate ability to perform static standing in // bars > 2 min with Max Assist  without loss of balance and fair posture for improved overall strength for pre-gait and transfer activities.     Baseline 01/04/2021= Patient current uanble to stand- Dependent on hoyer or sit to stand lift for transfers. 05/10/2021=Not appropriate yet- Currently still dependent with all transfers using hoyer. 06/21/2021= Patient continuing now to focus on LE strengthening to prepare for standing-unable to try today due to acute low back pain-  planning on  attempting in new cert period. 09/15/2021- Patient has attempted standing 2x in past two week- max Assist of 2 people - only once was he successful to clearing his bottom from chair - Will continue to be a focus during the new certification. 12/13/2021= Patient has been limited secondary to increased overall low back pain during this certification and will require more time to focus on this goal.  12/6: not assessed this date, will assess at date when 2-3 PTs are present for assistance     Time 12     Period Weeks     Status GOAL not appropriate at this time - may attempt in future once Patient presents with improved overall LE strength.                 PT LONG TERM GOAL #4    Title Pt will improve FOTO score by 10 points or more demonstrating improved perceived functional ability     Baseline FOTO 7 on 10/17; 03/15/21: FOTO 12; 05/10/2021 06/21/2021= 1; 09/15/2021= 9; 12/13/2021= Will issue next visit 12/6: 4; 06/06/2022= Will assess next visit; Goal not appropriate as patient is not ambulatory.     Time 12     Period Weeks     Status WITHDRAWN    Target date 11/30/2022         PT LONG TERM GOAL #5    Title Patient will perform sit to stand transfer with appropriate AD and max assist of 2 people with 75% consistency to prepare for pregait activities.     Baseline 09/15/2021= Patient unable to stand well- unable to clear his bottom off chair with Max assist of 2 persons. 12/13/2021- Goal not appropriate to try yet but will keep and roll over to next cert as shift continues to focus on transfers/standing; 4/24= Patient able to perform active ankle  DF/PF with right LE and able to raise his knee into seated march and clear floor without physical assist today - as previously unable as well as lift right knee ext to near full ROM to improve strength for eventual standing. 09/07/2022- Goal continues to not be appropriate but patient is now standing some and will keep goal active; 11/23/2022= patient is now performing  sit to stand with max A +2 and now able to clear his bottom off mat.     Time 12     Period Weeks     Status PROGRESSING    Target date         PT LONG TERM GOAL #6  Title Patient will tolerate sitting unsupported demonstrating erect sitting posture for 30 minutes with CGA to demonstrate improved back extensor strength and improved sitting tolerance.   Baseline 12/13/2021= Patient demonstrated unsupported sitting at edge of mat for approx 20 min;  12/29/2021- Patient performed approx 30 min of dynamic sitting activities today. 06/06/2022= Patient demonstrated ability to sit and perform static and dynamic UE/LE movement with only Supervision.   Time 12   Period Weeks   Status GOAL MET           7.  Patient will tolerate 5 minutes or more of standing in sit to stand lift or with max assist +2 in order to indicate improved lower extremity weightbearing tolerance for progression to standing in parallel bars. Baseline: 1 minute on most recent stand 02/21/22; 06/06/2022- Patient did attempt today after complaining of right LE pain last week. He attempted 3 stands using sit to stand lift. - all over 1 min- last one approx 48 sec- stopped due to fatigue. More erect standing in lift today - still poor gluteal strength but able to activate glutes and extend hips upon command but unable to hold > 5 sec. 07/28/2022- Will attempt standing next visit but patient has been able to stand since last progress note for up to 3 min at a time at his best. 09/07/2022-  attempted standing with max +2 and patient able to stand 15-20 sec so will now  revise goal; 11/23/2022- Patient at his best between last 2 visits was able to stand approx 50 sec with max A+2. Goal status: Ongoing  Target date: 11/30/2022   8. Patient will tolerate sitting unsupported demonstrating ability to perform dynamic UE/LE activities for 30 minutes  independently to demonstrate ability to sit at edge of bed to eat or perform some ADL's/exercise for  optimal quality of life.             Baseline: 06/06/2022- Patient able to sit and perform some UE/LE exercises but requires CGA at times for safety- he is able to static sit for 30 min with supervision. 07/27/2022= Patient able to now sit > 30 min with Supervision only - performing static and dynamic activities. Will keep goal active to ensure patient is consistent. 09/07/2022=  Patient able to demonstrate consistent ability to sit at edge of mat during visits             Goal Status: MET             Target date: 08/29/2022        Plan     Clinical Impression Statement  Patient arrived with great motivation and agreeable to lay flat to perform some supine therex and review of stretching. He presented with increased low back tightness and limited lumbar/hip ROM and will benefit from stretching  at home and some in clinic- Mother present to observe all techniques and to continue with stretching at home. He performed well overall - able to lift both legs mostly actively today and fatigue as only limiting factor.  Pt will continue to benefit from skilled physical therapy intervention to address impairments, improve QOL, and attain therapy goals.    Personal Factors and Comorbidities Comorbidity 3+;Time since onset of injury/illness/exacerbation    Comorbidities CVA, diabetes, Seizures    Examination-Activity Limitations Bathing;Bed Mobility;Bend;Caring for Others;Carry;Dressing;Hygiene/Grooming;Lift;Locomotion Level;Reach Overhead;Self Feeding;Sit;Squat;Stairs;Stand;Transfers;Toileting    Examination-Participation Restrictions Cleaning;Community Activity;Driving;Laundry;Medication Management;Meal Prep;Occupation;Personal Finances;Shop;Yard Work;Volunteer    Stability/Clinical Decision Making Evolving/Moderate complexity    Rehab Potential Fair    PT Frequency 2x / week    PT Duration 12 weeks    PT Treatment/Interventions ADLs/Self Care Home Management;Cryotherapy;Electrical Stimulation;Moist  Heat;Ultrasound;DME Instruction;Gait training;Stair training;Functional mobility training;Therapeutic exercise;Balance training;Patient/family education;Orthotic Fit/Training;Neuromuscular re-education;Wheelchair mobility training;Manual techniques;Passive range of motion;Dry needling;Energy conservation;Taping;Visual/perceptual remediation/compensation;Joint Manipulations    PT Next Visit Plan core strength/motor control in short-sitting, sit to stand if able; Continue with progressive LE Strengthening. Stand activities as appropriate.     PT Home Exercise Plan No changes to HEP today    Consulted and Agree with Plan of Care Patient;Family member/caregiver    Family Member Consulted Dad            5:15 PM, 11/28/22 Louis Meckel, PT Physical Therapist - [facility]  Outpatient Physical Therapy- [facility] [phone]      11/28/2022, 5:15 PM

## 2022-11-28 NOTE — Therapy (Signed)
Occupational Therapy Neuro Treatment Note  Patient Name: Paul Hall MRN: [id number] DOB:28-Sep-1995, 27 y.o., male Today's Date: 11/28/2022  PCP: Dr. Sherwood Gambler REFERRING PROVIDER: Dr. Sherwood Gambler   OT End of Session - 11/28/22 1727     Visit Number 53    Number of Visits 84    Date for OT Re-Evaluation 02/08/23    OT Start Time 1620    OT Stop Time 1705    OT Time Calculation (min) 45 min    Activity Tolerance Patient tolerated treatment well    Behavior During Therapy WFL for tasks assessed/performed                 Past Medical History:  Diagnosis Date   Diabetes mellitus (HCC)    Hypertension    Stroke [facility])    Past Surgical History:  Procedure Laterality Date   IVC FILTER PLACEMENT ([facility] HX)     LEG SURGERY     PEG TUBE PLACEMENT     TRACHEOSTOMY     Patient Active Problem List   Diagnosis Date Noted   Sepsis due to vancomycin resistant Enterococcus species (HCC) 06/06/2019   SIRS (systemic inflammatory response syndrome) (HCC) 06/05/2019   Acute lower UTI 06/05/2019   VRE (vancomycin-resistant Enterococci) infection 06/05/2019   Anemia 06/05/2019   Skin ulcer of sacrum with necrosis of muscle (HCC)    Urinary retention    Type 2 diabetes mellitus without complication, with long-term current use of insulin (HCC)    Tachycardia    Lower extremity edema    Acute metabolic encephalopathy    Obstructive sleep apnea    Morbid obesity with BMI of 60.0-69.9, adult (HCC)    Goals of care, counseling/discussion    Palliative care encounter    Sepsis (HCC) 04/27/2019   H/O insulin dependent diabetes mellitus 04/27/2019   History of CVA with residual deficit 04/27/2019   Seizure disorder (HCC) 04/27/2019   Decubitus ulcer of sacral region, stage 4 (HCC) 04/27/2019   ONSET DATE: 01/2019  REFERRING DIAG: CVA/COVID-19  THERAPY DIAG:  Muscle weakness (generalized)  Other lack of coordination  Rationale for Evaluation and Treatment  Rehabilitation  SUBJECTIVE:   SUBJECTIVE STATEMENT:  Pt. reports doing well today, and reports having a good weekend. Pt. Repos that his grandmother is coming for a visit this weekend. Pt accompanied by: self and family member  PERTINENT HISTORY:  Pt. is a 27 y.o. male who was diagnosed with COVID-19, and CVA with resultant quadriplegia. Pt. Was then hospitalized with VRE UTI. PMHx includes: urinary retention, seizure disorder, obstructive sleep disorder, DM Type II, Morbid obesity.   PRECAUTIONS: None  WEIGHT BEARING RESTRICTIONS No  PAIN:  Are you having pain? Discomfort/tightness initially with the first few reps of ROM in the right elbow, and in the right 3rd, and 4th digits.   FALLS: Has patient fallen in last 6 months? No  LIVING ENVIRONMENT: Lives with: lives with their family Lives in: House/apartment Stairs: No Level Entry Has following equipment at home: Wheelchair (power) and hoyer lift, sit to stand lift  PLOF: Independent  PATIENT GOALS: To be able to engage in more daily care tasks.  OBJECTIVE:   HAND DOMINANCE: Right  ADLs:  Transfers/ambulation related to ADLs: Eating: Pt. reports being able to hold standard utensils, and is starting to engage more in self-feeding tasks, hand to mouth patterns. Pt. Reports that he does as much as he can with the task, and family assists  with the remainder of the task.  Grooming: Pt. Is able to initiate holding an electric toothbrush, and brush his teeth. Family assists LB Dressing: Total Assist UE dressing: Pt. is now able to reach up to actively assist with grasping , and pulling his gown down. Toileting:  Total Assist Bathing: MaxA UB, Total assist LB Tub Shower transfers: N/A Equipment: See above    IADLs: Shopping: Relies on family to assist Light housekeeping: Total Assist Meal Prep: Total Assist Community mobility:   Medication management:  Total Assist  Financial management: N/A Handwriting: Not legible:  Pt. Is able to hold a pen with the left hand, and initiate marking the page. Pt.'s eye glasses were not available  MOBILITY STATUS:  Power w/c  POSTURE COMMENTS:  Pt. Requires position changes in his power w/c  ACTIVITY TOLERANCE: Activity tolerance:  Fair  FUNCTIONAL OUTCOME MEASURES: FOTO: 40  TR score: 45  05/25/2022:   FOTO: 50 TR score: 45  07/20/22: FOTO 55  UPPER EXTREMITY ROM     Active ROM Right eval Right 03/21/2022 Right 05/25/2022 R  07/20/22 Right  07/25/22 Right 08/15/2022 Right 09/26/2022 Right Left eval Left  03/21/2022 Left 05/25/2022 L  07/20/22 Left 07/25/2022 Left  08/15/2022 Left  09/26/2022 Left  11/16/2022  Shoulder flexion 106 scaption 105  Scaption 62 flexion 110 scaption 100  105 105 105 119 123 125 130  136 138 130  Shoulder abduction 114 90 97 100  105 117 120 110 115 115 115  118 121 125  Shoulder adduction                  Shoulder extension                  Shoulder internal rotation                  Shoulder external rotation                  Elbow flexion 120(130) 145 145 140  144 140 142 135(135) 145 145 145  145    Elbow extension -45(-35) -22(-35) -28(-22)  -32(-21) -32(-28) -28(-26) -28(-28) -27(-18) -21(-20) -20(-16)  -25(-10) -23(-10) -21(-10) -20(-18)  Wrist flexion                  Wrist extension -30(10) -25(10) -10(30)  -10(20) -10(25) -20(40) -30(10) 10(50) 19(50) 28(50)  30(50) 35(55) 36(55) 36(60)  Wrist ulnar deviation                  Wrist radial deviation                  Wrist pronation                  Wrist supination   Limited by flexor tone        Limited by flexor tone       (Blank rows = not tested)  UPPER EXTREMITY MMT:     Right eval Right 03/21/2022 Right  05/25/2022 R 07/20/22 Right 08/25/2022 Right 09/26/2022 Right 11/16/2022 Left eval Left 03/21/2022 Left  05/25/2022 L 07/20/22 Left  08/15/2022 Left 09/26/2022 Left 11/16/2022  Shoulder flexion 3-/5 scaption 3-/5 3-/5 3+/5 3+/5 3+/5 3/5 3-/5 3/5 3+/5 4+/5 4+/5  4+/5 4+/5  Shoulder abduction 3-/5 3-/5 3-/5 4/5 4/5 4/5 4/5 3-/5 3-/5 4-/5 4+/5 4+/5 4+5 4+/5  Shoulder adduction                Shoulder extension  Shoulder internal rotation                Shoulder external rotation                Elbow flexion 3/5 3/5 3+/5 4/5 4+/5 4+/5 4+/5 3/5 3+/5 4-/5 4+/5 5/5 5/5 5/5  Elbow extension 2-/5 2/5 3-/5 4+/5 4+/5 4+/5 4+/5 2-/5 2/5  4+/5 4+/5 4+/5   Wrist flexion                Wrist extension 2-/5 2-/5 2/5 2/5 2/5 2/5 2/5 2/5 2+/5 3-/5  3-/5 3-/5 3-/5  Wrist ulnar deviation                Wrist radial deviation                Wrist pronation                Wrist supination                (Blank rows = not tested)   HAND FUNCTION:  Grip strength: Right: 0 lbs; Left: 0 lbs and Lateral pinch: Right: 5 lbs, Left: 2 lbs  03/21/2022: Lateral pinch: Right: 3.5 lbs, Left: 2 lbs  07/20/22: Grip strength: Right: 3 lbs; Left: 4 lbs and Lateral pinch: Right: 5 lbs, Left: 3 lbs  08/15/2022:  Grip strength: Right: 0 lbs; Left: 5 lbs and Lateral pinch: Right: 2 lbs, Left: 4 lbs  09/26/2022  Grip strength: Right: 0 lbs; Left: 8 lbs and Lateral pinch: Right: 2 lbs, Left: 5 lbs  11/16/2022  Grip strength: Right: 0 lbs; Left: 8 lbs   Bilateral digit PIP/DIP flexion contractures with MP hyperextension with attempts for AROM. Pt. is able to tolerate AROM to the bilateral digits at the initial evaluation however, has a history of pain in the digits.  COORDINATION: Eval: Pt. is unable to grasp 9-hole test pegs. Pt. is able to initiate grasping larger pegs, and is able to hold a pen in the left hand.  07/20/22: 2 min 36 seconds to remove 9 pegs from 9 hole peg test - cues to locate pegs 2/2 low vision. Pt. is able to initiate grasping larger pegs on R hand and is able to hold a pen in the left hand.  08/15/2022:      Vision, and sensation limiting accuracy of 9 hole peg test results. Pt. Was able to grasp and remove vertical pegs intermittently  with cues.    SENSATION: Light touch: Impaired   EDEMA:  N/A  MUSCLE TONE: BUE flexor Spasticity  COGNITION Overall cognitive status: Continue to assess in functional context  VISION:   Subjective report: Pt. was not wearing glasses at the time of the initial eval.  Baseline vision: Vision is very limited. Wears glasses all the time Visual history: History of impaired vision following CVA. Pt. Has received treatment through the [facility] low vision rehabilitation program.   VISION ASSESSMENT: Impaired To be further assessed in functional context  PERCEPTION: Impaired   PRAXIS: Impaired: motor planning  OBSERVATIONS:  Pt reports being on Tramadol   TODAY'S TREATMENT   Therapeutic Ex.:  Emphasis was placed on slow prolonged gentle stretching for RUE, forearm supination, wrist extension, wrist extension, digit MP, PIP, and DIP extension following moist heat. ROM was performed to decrease stiffness, and prepare the RUE for functional use. ROM was performed  following 5 min. for moist heat to the right elbow, forearm, wrist, and digits. PROM for Left digit  MP, PIP, and DIP extension.  Manual Therapy:  Pt. Tolerated soft tissue massage to the right digit lateral, and sagittal bands 2/2 stiffness, and discomfort in preparation and independent of ROM/there. Ex.      PATIENT EDUCATION: Education details: Doctor, general practice and fork positioning for self feeding Person educated: Patient and Parent Education method: Explanation, Demonstration, Tactile cues, and Verbal cues Education comprehension: verbalized understanding, returned demonstration, verbal cues required, and needs further education  HOME EXERCISE PROGRAM:  Continue ongoing assessment, and continue to provide as needed.   GOALS: Goals reviewed with patient? Yes  SHORT TERM GOALS: Target date: 11/07/2022   To assess splint fit, and make appropriate adjustments to promote good skin integrity through the palmar surface of  the bilateral hands.  Baseline: 05/25/22: Goal currently met, however ongoing as needs to assess splint fit arise. 03/23/2022: Pt. is wearing splints a couple of hours at night bilateral resting hand splints. 03/21/2022: Pt. is wearing splints a couple of hours at night bilateral resting hand splints. Goal status: Deferred   LONG TERM GOALS: Target date: 02/08/2023    FOTO score will Improve by 2 points for Pt. perceived improvement with the assessment specific ADL/IADL tasks.  Baseline: 11/16/2022: 52 09/27/2022: 51 07/20/2022: FOTO 55 05/25/2022: FOTO score: 50,  TR score: 45 Eval: FOTO score: 40,  TR score: 45 Goal status: Achieved   2.   Pt. will independently perform oral care for 100% of the task after complete set-up. Baseline: 11/16/2022: Pt. Completes 90% of the task with complete set-up. 09/26/2022: Completes 90% of the task, proximal assist at times required at the elbow.  08/15/2022: Completes 90% of the task, proximal assist at times required at the elbow. 07/20/22: completes 90% of task, limited by shoulder flexion. 05/25/2022:  Pt. Is able to initiate and perform oral care for approximately 90% of the task. Complete set-up required. Assi needed only for the very back teeth. 03/23/2022: Pt. Is able to initiate and perform oral care for approximately 75% of the task. Pt. Requires assist at proximally at the elbow for through oral care. 03/21/2022: Pt. Is able to initiate and perform oral care for approximately 75% of the task. Pt. Requires assist at proximally at the elbow for through oral care. Eval: Pt. is able to initiate using an electric toothbrush. Pt. requires assist for set-up, and assist for thoroughness, and as he Pt. fatigues. Goal status: Ongoing  3.  Pt. Will be modified independence with self-feeding for 75% using a swivel spoon, and universal fork after complete set-up Baseline: 11/16/2022: 100% with finger foods, Pt. Is initiating with a swivel spoon, and universal cuff for fork use,  however requires assist for consistency, and accuracy.  09/26/2022: 90% using the spoon, assist at time with the forearm motion, and grip on the utensils. 100% for finger foods.  08/15/2022:  90% using the spoon, assit at time with the forearm motion, and grip on the utensils. 100% for finger foods-independent with set-up- unsupervised.  07/20/22: self-feeds cereal using spoon 90% of task. 05/25/2022: Pt. Is able to use a spoon to scoop cereal when feeding himself cereal 85% of the time. Pt. Is able to feed himself snack/finger foods 100% of the time. Pt. Continues to work on consistency of  stabilizing a cup/mug when drinking. Pt. Is able to grasp a water bottle with assist initially, with assist tapering off as he drinks.03/23/2022: Pt. Is able to perform scooping cereal for 75% of the time. Pt. required assist, and support at the  left elbow, and Pt. Presents with limited forearm supination when using the spoon, and bringing it towards his mouth. Pt. Is able to use a fork to spear items, and perform the hand to mouth pattern.  03/21/2022: Pt. Is able to perform scooping cereal for 75% of the time. Pt. required assist, and support at the left elbow, and Pt. presents with limited forearm supination when using the spoon, and bringing it towards his mouth. Pt. Is able to use a fork to spear items, and perform the hand to mouth pattern.  Eval: Pt. is able to hold standard standard utensils. Pt. Performs as much of the task as he, can and has assistance for the remainder. Goal status:  Revised  4.  Pt. Will improve grasp patterns and consistently grasp 1/4" objects for ADL, and IADL tasks.  Baseline: 11/16/2022: Continues to grasp 1/4" pegs with min a + visual cues, consistently grasping 1/2" objects with visual cues 09/26/2022: Continues to grasp 1/4" pegs with min a + visual cues, consistently grasping 1/2" objects with visual cues. 08/15/2022: grasps 1/4" pegs with min a + visual cues, consistently grasping 1/2" objects  with visual cues. 07/20/22: grasps 1/4" pegs with min a + visual cues, consistently grasping 1/2" objects with visual cues. 05/25/2022: Pt. Is working on improving consistency of grasping 1/2" objects with visual cues.  03/23/2022: Pt. Is able to grasp 1" objects consistently,and continues to work on the hand patterns needed to grasp 1/2" objects.03/21/2022: Pt. Is able to grasp 1" objects consistently,and continues to work on the hand patterns needed to grasp 1/2" objects. Eval: Pt. is able to grasp 1" objects intermittently using a lateral grasp pattern. Goal status: Progressing  5.  Pt. will independently write his name legibly with letter sizes under 1". Baseline: 11/16/2022: Pt. Continues to be able to write name with smaller letter size. Pt. Is signing name with a computer styus. Pt. Requires visual cues, and assist 09/26/2022: Pt. Continues to be able to write name with smaller letter size. Pt. Is signing name with a computer styus. Pt. Requires visual cues, and assist. 08/15/2022: Pt. Is able to write name with smaller letter size. Pt. Is signing name with a computer styus. Pt. Requires visual cues, and assist. 07/20/22: stabilizing assist to write name with 75% legibility with 2" letters. 05/25/2022: Pt. to continue to work towards formulating grasp patterns in preparation for grasping a large width pen. Pt. Requires visual cues. 03/23/2022: Pt. Is able to write his name with modA, however has difficulty with formulating letter sizes less than 2" in size with 50% legibility for the 3 letters of his name.03/21/2022: Pt. Is able to write his name with modA, however has difficulty with formulating letter sizes less than 2" in size with 50% legibility for the 3 letters of his name. Eval: Pt is able to hold a thin marker with his left hand, and formulate a line, and initiate a circular pattern (Pt. without glasses today) Goal status: Progressing  6. Pt. Will reach up to comb/brush his hair with minA.  Baseline:  11/16/2022: Pt. Is able to use a hair pick for the left side of his head using his left hand. Pt. Presents with difficulty reaching to the right side of his head.09/26/2022: Pt. Is able to use a hair pick for the left side of his head using his left hand. Pt. Presents with difficulty reaching to the right side of his head. 5/13/202: 75% using a hair pick, assist to the right side of  the head. 07/20/22: reaches 75% of head, assist for far R side of head, fatigues quickly. 05/25/2022: Pt. Is able to reach up with the left hand to the left side, top, and back of his head. 03/23/2022: Pt. is now able to more consistently initiate reaching up to his head with his left hand in preparation for haircare12/18/2023: Pt. is now able to more consistently initiate reaching up to his head with his left hand in preparation for haircare. Eval: Pt. is able to initiate reaching up for hair care with a long handled brush, however is unable to sustain UE's in elevation to perform the task.     Goal status: Ongoing  7. Pt. Will independently navigate the w/c through his environment with minA with visual scanning, and hand placement on the controls.  Baseline: 11/16/2022: Deferred 2/2 vision 09/26/2022: Pt. Is now navigating his w/c ouside the home, and around his block. 08/15/2022: Pt. Requires minA to setup hand on controls and MIN cues to navigate the w/c in wide spaces, requires MIN - MOD cues to navigate the w/c through more narrow doorways, and tighter turns - varies based on good vs bad vision days.07/20/22: Pt. Requires minA to setup hand on controls and MIN cues to navigate the w/c in wide spaces, requires MIN - MOD cues to navigate the w/c through more narrow doorways, and tighter turns - varies based on good vs bad vision days. 05/25/2022: Pt. Requires minA  and  cues to navigate the w/c in wide spaces, and requires Mod cues to navigate the w/c through more narrow doorways, and tighter turns. Pt. Requires max cues for scanning  through the environment, and moderate cues for hand placement on the controls.     Goal status: Deferred 2/2 vision    8. Pt. Will improve bilateral grip strength to be able to independently grasp, and pull up blankets, and linens.   Baseline: 11/16/2022: R: 0 L: 8# Pt. Is more consistently grasping his blankets and attempting to pull them up. 09/26/2022: R: 0  L: 8# Pt. is attempting to grasp, and pull blankets up more, using mostly the left hand. 08/15/2022: Pt. has difficulty securely holding, and pulling up blankets, and linens.    Goal status: Ongoing  9. Pt. will consistently actively control the releasing  of blankets, covers, and linens from his hands once they are in the desired position over him. Baseline: 11/16/2022: Pt. continues to improve with actively releasing blankets form his hands. 09/26/2022: Pt. is improving with actively releasing blankets form his hands. 08/15/2022: Pt. has difficulty with controlled releasing of blankets/linens from his grasp.     Goal status: Ongoing    10. Pt. Will improve bilateral UE strength by 2 MM grades to assist with ADLs, and IADLs  Baseline: 11/16/2022: Right: shoulder flexion: 3/5, abduction: 4/5, elbow flexion: 4+/5 , extension: 4+/5 wrist extension: 2/5; Left: shoulder flexion: 4+/5, abduction: 4+/5, elbow flexion: 5/5 , extension:  wrist extension: 3-/5 Right: shoulder flexion: 3+/5, abduction: 4/5, elbow flexion: 4+/5 , extension: 4+/5 wrist extension: 2/5; Left: shoulder flexion: 4+/5, abduction: 4+/5, elbow flexion: 5/5 , extension: 4+/5 wrist extension: 3-/5  11. Pt. Will improve right wrist extension by 10 degrees to initiate reaching for items in preparation for ADLs. Baseline: 11/16/2022: -30(10)    Goal status: New   ASSESSMENT: CLINICAL IMPRESSION:  Pt. continues to present with limited ROM, tightness, in the right 2/2  flexor tone, and tightness in the RUE, forearm, wrist, and digits.  Pt. presents with  discomfort in the right 3rd, and  4th digit MPs, and PIPs. Pt. tolerated Moist heat, ROM, and stretching well. Pt. continues to work on improving BUE functioning, ROM, strength, motor control, [facility] skills, and visual compensatory strategies to be able to independently pull up, and release blankets/covers, improve writing legibility, improve self-care including oral care, and utensil use during self-feeding tasks, and maximize independence with ADLs, and IADLs.     PERFORMANCE DEFICITS in functional skills including ADLs, IADLs, coordination, dexterity, proprioception, ROM, strength, pain, FMC, GMC, decreased knowledge of use of DME, and UE functional use, cognitive skills including safety awareness, and psychosocial skills including coping strategies, environmental adaptation, habits, and routines and behaviors.   IMPAIRMENTS are limiting patient from ADLs, IADLs, leisure, and social participation.   COMORBIDITIES may have co-morbidities  that affects occupational performance. Patient will benefit from skilled OT to address above impairments and improve overall function.  MODIFICATION OR ASSISTANCE TO COMPLETE EVALUATION: Maximum or significant modification of tasks or assist is necessary to complete an evaluation.  OT OCCUPATIONAL PROFILE AND HISTORY: Comprehensive assessment: Review of records and extensive additional review of physical, cognitive, psychosocial history related to current functional performance.  CLINICAL DECISION MAKING: High - multiple treatment options, significant modification of task necessary  REHAB POTENTIAL: Fair    EVALUATION COMPLEXITY: High    PLAN: OT FREQUENCY: 2 x's a week  OT DURATION:12 weeks  PLANNED INTERVENTIONS: self care/ADL training, therapeutic exercise, therapeutic activity, neuromuscular re-education, manual therapy, passive range of motion, patient/family education, and cognitive remediation/compensation RECOMMENDED OTHER SERVICES: PT  CONSULTED AND AGREED WITH PLAN OF CARE:  Patient and family member/caregiver  PLAN FOR NEXT SESSION: Initiate treatment  Olegario Messier, MS, OTR/L   11/28/22, 5:29 PM

## 2022-11-30 ENCOUNTER — Ambulatory Visit: Payer: BC Managed Care – PPO | Admitting: Occupational Therapy

## 2022-11-30 ENCOUNTER — Ambulatory Visit: Payer: Medicare Other

## 2022-11-30 DIAGNOSIS — R262 Difficulty in walking, not elsewhere classified: Secondary | ICD-10-CM | POA: Diagnosis not present

## 2022-11-30 DIAGNOSIS — R269 Unspecified abnormalities of gait and mobility: Secondary | ICD-10-CM

## 2022-11-30 DIAGNOSIS — I6930 Unspecified sequelae of cerebral infarction: Secondary | ICD-10-CM

## 2022-11-30 DIAGNOSIS — M6281 Muscle weakness (generalized): Secondary | ICD-10-CM | POA: Diagnosis not present

## 2022-11-30 DIAGNOSIS — R278 Other lack of coordination: Secondary | ICD-10-CM

## 2022-11-30 DIAGNOSIS — R2681 Unsteadiness on feet: Secondary | ICD-10-CM

## 2022-11-30 NOTE — Therapy (Signed)
Occupational Therapy Neuro Treatment Note  Patient Name: Paul Skeeters MRN: [id number] DOB:06-22-1995, 27 y.o., male Today's Date: 11/30/2022  PCP: Dr. Sherwood Gambler REFERRING PROVIDER: Dr. Sherwood Gambler   OT End of Session - 11/30/22 1717     Visit Number [id number]    Number of Visits 84    Date for OT Re-Evaluation 02/08/23    OT Start Time 1625    OT Stop Time 1705    OT Time Calculation (min) 40 min    Activity Tolerance Patient tolerated treatment well                 Past Medical History:  Diagnosis Date   Diabetes mellitus (HCC)    Hypertension    Stroke [facility])    Past Surgical History:  Procedure Laterality Date   IVC FILTER PLACEMENT ([facility] HX)     LEG SURGERY     PEG TUBE PLACEMENT     TRACHEOSTOMY     Patient Active Problem List   Diagnosis Date Noted   Sepsis due to vancomycin resistant Enterococcus species (HCC) 06/06/2019   SIRS (systemic inflammatory response syndrome) (HCC) 06/05/2019   Acute lower UTI 06/05/2019   VRE (vancomycin-resistant Enterococci) infection 06/05/2019   Anemia 06/05/2019   Skin ulcer of sacrum with necrosis of muscle (HCC)    Urinary retention    Type 2 diabetes mellitus without complication, with long-term current use of insulin (HCC)    Tachycardia    Lower extremity edema    Acute metabolic encephalopathy    Obstructive sleep apnea    Morbid obesity with BMI of 60.0-69.9, adult (HCC)    Goals of care, counseling/discussion    Palliative care encounter    Sepsis (HCC) 04/27/2019   H/O insulin dependent diabetes mellitus 04/27/2019   History of CVA with residual deficit 04/27/2019   Seizure disorder (HCC) 04/27/2019   Decubitus ulcer of sacral region, stage 4 (HCC) 04/27/2019   ONSET DATE: 01/2019  REFERRING DIAG: CVA/COVID-19  THERAPY DIAG:  Muscle weakness (generalized)  Rationale for Evaluation and Treatment Rehabilitation  SUBJECTIVE:   SUBJECTIVE STATEMENT:  Pt. reports looking forward to his grandmother coming to  visit this weekend. Pt accompanied by: self and family member  PERTINENT HISTORY:  Pt. is a 27 y.o. male who was diagnosed with COVID-19, and CVA with resultant quadriplegia. Pt. Was then hospitalized with VRE UTI. PMHx includes: urinary retention, seizure disorder, obstructive sleep disorder, DM Type II, Morbid obesity.   PRECAUTIONS: None  WEIGHT BEARING RESTRICTIONS No  PAIN:  Are you having pain? Discomfort/tightness initially with the first few reps of ROM in the right elbow, and in the right 3rd, and 4th digits.   FALLS: Has patient fallen in last 6 months? No  LIVING ENVIRONMENT: Lives with: lives with their family Lives in: House/apartment Stairs: No Level Entry Has following equipment at home: Wheelchair (power) and hoyer lift, sit to stand lift  PLOF: Independent  PATIENT GOALS: To be able to engage in more daily care tasks.  OBJECTIVE:   HAND DOMINANCE: Right  ADLs:  Transfers/ambulation related to ADLs: Eating: Pt. reports being able to hold standard utensils, and is starting to engage more in self-feeding tasks, hand to mouth patterns. Pt. Reports that he does as much as he can with the task, and family assists  with the remainder of the task. Grooming: Pt. Is able to initiate holding an electric toothbrush, and brush his teeth. Family assists LB Dressing: Total Assist UE dressing: Pt. is now able  to reach up to actively assist with grasping , and pulling his gown down. Toileting:  Total Assist Bathing: MaxA UB, Total assist LB Tub Shower transfers: N/A Equipment: See above    IADLs: Shopping: Relies on family to assist Light housekeeping: Total Assist Meal Prep: Total Assist Community mobility:   Medication management:  Total Assist  Financial management: N/A Handwriting: Not legible: Pt. Is able to hold a pen with the left hand, and initiate marking the page. Pt.'s eye glasses were not available  MOBILITY STATUS:  Power w/c  POSTURE COMMENTS:  Pt.  Requires position changes in his power w/c  ACTIVITY TOLERANCE: Activity tolerance:  Fair  FUNCTIONAL OUTCOME MEASURES: FOTO: 40  TR score: 45  05/25/2022:   FOTO: 50 TR score: 45  07/20/22: FOTO 55  UPPER EXTREMITY ROM     Active ROM Right eval Right 03/21/2022 Right 05/25/2022 R  07/20/22 Right  07/25/22 Right 08/15/2022 Right 09/26/2022 Right Left eval Left  03/21/2022 Left 05/25/2022 L  07/20/22 Left 07/25/2022 Left  08/15/2022 Left  09/26/2022 Left  11/16/2022  Shoulder flexion 106 scaption 105  Scaption 62 flexion 110 scaption 100  105 105 105 119 123 125 130  136 138 130  Shoulder abduction 114 90 97 100  105 117 120 110 115 115 115  118 121 125  Shoulder adduction                  Shoulder extension                  Shoulder internal rotation                  Shoulder external rotation                  Elbow flexion 120(130) 145 145 140  144 140 142 135(135) 145 145 145  145    Elbow extension -45(-35) -22(-35) -28(-22)  -32(-21) -32(-28) -28(-26) -28(-28) -27(-18) -21(-20) -20(-16)  -25(-10) -23(-10) -21(-10) -20(-18)  Wrist flexion                  Wrist extension -30(10) -25(10) -10(30)  -10(20) -10(25) -20(40) -30(10) 10(50) 19(50) 28(50)  30(50) 35(55) 36(55) 36(60)  Wrist ulnar deviation                  Wrist radial deviation                  Wrist pronation                  Wrist supination   Limited by flexor tone        Limited by flexor tone       (Blank rows = not tested)  UPPER EXTREMITY MMT:     Right eval Right 03/21/2022 Right  05/25/2022 R 07/20/22 Right 08/25/2022 Right 09/26/2022 Right 11/16/2022 Left eval Left 03/21/2022 Left  05/25/2022 L 07/20/22 Left  08/15/2022 Left 09/26/2022 Left 11/16/2022  Shoulder flexion 3-/5 scaption 3-/5 3-/5 3+/5 3+/5 3+/5 3/5 3-/5 3/5 3+/5 4+/5 4+/5 4+/5 4+/5  Shoulder abduction 3-/5 3-/5 3-/5 4/5 4/5 4/5 4/5 3-/5 3-/5 4-/5 4+/5 4+/5 4+5 4+/5  Shoulder adduction                Shoulder extension                 Shoulder internal rotation  Shoulder external rotation                Elbow flexion 3/5 3/5 3+/5 4/5 4+/5 4+/5 4+/5 3/5 3+/5 4-/5 4+/5 5/5 5/5 5/5  Elbow extension 2-/5 2/5 3-/5 4+/5 4+/5 4+/5 4+/5 2-/5 2/5  4+/5 4+/5 4+/5   Wrist flexion                Wrist extension 2-/5 2-/5 2/5 2/5 2/5 2/5 2/5 2/5 2+/5 3-/5  3-/5 3-/5 3-/5  Wrist ulnar deviation                Wrist radial deviation                Wrist pronation                Wrist supination                (Blank rows = not tested)   HAND FUNCTION:  Grip strength: Right: 0 lbs; Left: 0 lbs and Lateral pinch: Right: 5 lbs, Left: 2 lbs  03/21/2022: Lateral pinch: Right: 3.5 lbs, Left: 2 lbs  07/20/22: Grip strength: Right: 3 lbs; Left: 4 lbs and Lateral pinch: Right: 5 lbs, Left: 3 lbs  08/15/2022:  Grip strength: Right: 0 lbs; Left: 5 lbs and Lateral pinch: Right: 2 lbs, Left: 4 lbs  09/26/2022  Grip strength: Right: 0 lbs; Left: 8 lbs and Lateral pinch: Right: 2 lbs, Left: 5 lbs  11/16/2022  Grip strength: Right: 0 lbs; Left: 8 lbs   Bilateral digit PIP/DIP flexion contractures with MP hyperextension with attempts for AROM. Pt. is able to tolerate AROM to the bilateral digits at the initial evaluation however, has a history of pain in the digits.  COORDINATION: Eval: Pt. is unable to grasp 9-hole test pegs. Pt. is able to initiate grasping larger pegs, and is able to hold a pen in the left hand.  07/20/22: 2 min 36 seconds to remove 9 pegs from 9 hole peg test - cues to locate pegs 2/2 low vision. Pt. is able to initiate grasping larger pegs on R hand and is able to hold a pen in the left hand.  08/15/2022:      Vision, and sensation limiting accuracy of 9 hole peg test results. Pt. Was able to grasp and remove vertical pegs intermittently with cues.    SENSATION: Light touch: Impaired   EDEMA:  N/A  MUSCLE TONE: BUE flexor Spasticity  COGNITION Overall cognitive status: Continue to assess in  functional context  VISION:   Subjective report: Pt. was not wearing glasses at the time of the initial eval.  Baseline vision: Vision is very limited. Wears glasses all the time Visual history: History of impaired vision following CVA. Pt. Has received treatment through the [facility] low vision rehabilitation program.   VISION ASSESSMENT: Impaired To be further assessed in functional context  PERCEPTION: Impaired   PRAXIS: Impaired: motor planning  OBSERVATIONS:  Pt reports being on Tramadol   TODAY'S TREATMENT   Therapeutic Ex.:  Emphasis was placed on slow prolonged gentle stretching for RUE, forearm supination, wrist extension, wrist extension, digit MP, PIP, and DIP extension following moist heat. ROM was performed to decrease stiffness, and prepare the RUE for functional use. ROM was performed following 5 min. for moist heat to the right elbow, forearm, wrist, and digits. PROM for Left digit MP, PIP, and DIP extension.  Manual Therapy:  Pt. Tolerated soft tissue massage to the right digit lateral,  and sagittal bands 2/2 stiffness, and discomfort in preparation and independent of ROM/there. Ex.      PATIENT EDUCATION: Education details: Doctor, general practice and fork positioning for self feeding Person educated: Patient and Parent Education method: Explanation, Demonstration, Tactile cues, and Verbal cues Education comprehension: verbalized understanding, returned demonstration, verbal cues required, and needs further education  HOME EXERCISE PROGRAM:  Continue ongoing assessment, and continue to provide as needed.   GOALS: Goals reviewed with patient? Yes  SHORT TERM GOALS: Target date: 11/07/2022   To assess splint fit, and make appropriate adjustments to promote good skin integrity through the palmar surface of the bilateral hands.  Baseline: 05/25/22: Goal currently met, however ongoing as needs to assess splint fit arise. 03/23/2022: Pt. is wearing splints a couple of hours  at night bilateral resting hand splints. 03/21/2022: Pt. is wearing splints a couple of hours at night bilateral resting hand splints. Goal status: Deferred   LONG TERM GOALS: Target date: 02/08/2023    FOTO score will Improve by 2 points for Pt. perceived improvement with the assessment specific ADL/IADL tasks.  Baseline: 11/16/2022: 52 09/27/2022: 51 07/20/2022: FOTO 55 05/25/2022: FOTO score: 50,  TR score: 45 Eval: FOTO score: 40,  TR score: 45 Goal status: Achieved   2.   Pt. will independently perform oral care for 100% of the task after complete set-up. Baseline: 11/16/2022: Pt. Completes 90% of the task with complete set-up. 09/26/2022: Completes 90% of the task, proximal assist at times required at the elbow.  08/15/2022: Completes 90% of the task, proximal assist at times required at the elbow. 07/20/22: completes 90% of task, limited by shoulder flexion. 05/25/2022:  Pt. Is able to initiate and perform oral care for approximately 90% of the task. Complete set-up required. Assi needed only for the very back teeth. 03/23/2022: Pt. Is able to initiate and perform oral care for approximately 75% of the task. Pt. Requires assist at proximally at the elbow for through oral care. 03/21/2022: Pt. Is able to initiate and perform oral care for approximately 75% of the task. Pt. Requires assist at proximally at the elbow for through oral care. Eval: Pt. is able to initiate using an electric toothbrush. Pt. requires assist for set-up, and assist for thoroughness, and as he Pt. fatigues. Goal status: Ongoing  3.  Pt. Will be modified independence with self-feeding for 75% using a swivel spoon, and universal fork after complete set-up Baseline: 11/16/2022: 100% with finger foods, Pt. Is initiating with a swivel spoon, and universal cuff for fork use, however requires assist for consistency, and accuracy.  09/26/2022: 90% using the spoon, assist at time with the forearm motion, and grip on the utensils. 100% for  finger foods.  08/15/2022:  90% using the spoon, assit at time with the forearm motion, and grip on the utensils. 100% for finger foods-independent with set-up- unsupervised.  07/20/22: self-feeds cereal using spoon 90% of task. 05/25/2022: Pt. Is able to use a spoon to scoop cereal when feeding himself cereal 85% of the time. Pt. Is able to feed himself snack/finger foods 100% of the time. Pt. Continues to work on consistency of  stabilizing a cup/mug when drinking. Pt. Is able to grasp a water bottle with assist initially, with assist tapering off as he drinks.03/23/2022: Pt. Is able to perform scooping cereal for 75% of the time. Pt. required assist, and support at the left elbow, and Pt. Presents with limited forearm supination when using the spoon, and bringing it towards his mouth.  Pt. Is able to use a fork to spear items, and perform the hand to mouth pattern.  03/21/2022: Pt. Is able to perform scooping cereal for 75% of the time. Pt. required assist, and support at the left elbow, and Pt. presents with limited forearm supination when using the spoon, and bringing it towards his mouth. Pt. Is able to use a fork to spear items, and perform the hand to mouth pattern.  Eval: Pt. is able to hold standard standard utensils. Pt. Performs as much of the task as he, can and has assistance for the remainder. Goal status:  Revised  4.  Pt. Will improve grasp patterns and consistently grasp 1/4" objects for ADL, and IADL tasks.  Baseline: 11/16/2022: Continues to grasp 1/4" pegs with min a + visual cues, consistently grasping 1/2" objects with visual cues 09/26/2022: Continues to grasp 1/4" pegs with min a + visual cues, consistently grasping 1/2" objects with visual cues. 08/15/2022: grasps 1/4" pegs with min a + visual cues, consistently grasping 1/2" objects with visual cues. 07/20/22: grasps 1/4" pegs with min a + visual cues, consistently grasping 1/2" objects with visual cues. 05/25/2022: Pt. Is working on improving  consistency of grasping 1/2" objects with visual cues.  03/23/2022: Pt. Is able to grasp 1" objects consistently,and continues to work on the hand patterns needed to grasp 1/2" objects.03/21/2022: Pt. Is able to grasp 1" objects consistently,and continues to work on the hand patterns needed to grasp 1/2" objects. Eval: Pt. is able to grasp 1" objects intermittently using a lateral grasp pattern. Goal status: Progressing  5.  Pt. will independently write his name legibly with letter sizes under 1". Baseline: 11/16/2022: Pt. Continues to be able to write name with smaller letter size. Pt. Is signing name with a computer styus. Pt. Requires visual cues, and assist 09/26/2022: Pt. Continues to be able to write name with smaller letter size. Pt. Is signing name with a computer styus. Pt. Requires visual cues, and assist. 08/15/2022: Pt. Is able to write name with smaller letter size. Pt. Is signing name with a computer styus. Pt. Requires visual cues, and assist. 07/20/22: stabilizing assist to write name with 75% legibility with 2" letters. 05/25/2022: Pt. to continue to work towards formulating grasp patterns in preparation for grasping a large width pen. Pt. Requires visual cues. 03/23/2022: Pt. Is able to write his name with modA, however has difficulty with formulating letter sizes less than 2" in size with 50% legibility for the 3 letters of his name.03/21/2022: Pt. Is able to write his name with modA, however has difficulty with formulating letter sizes less than 2" in size with 50% legibility for the 3 letters of his name. Eval: Pt is able to hold a thin marker with his left hand, and formulate a line, and initiate a circular pattern (Pt. without glasses today) Goal status: Progressing  6. Pt. Will reach up to comb/brush his hair with minA.  Baseline: 11/16/2022: Pt. Is able to use a hair pick for the left side of his head using his left hand. Pt. Presents with difficulty reaching to the right side of his  head.09/26/2022: Pt. Is able to use a hair pick for the left side of his head using his left hand. Pt. Presents with difficulty reaching to the right side of his head. 5/13/202: 75% using a hair pick, assist to the right side of the head. 07/20/22: reaches 75% of head, assist for far R side of head, fatigues quickly. 05/25/2022: Pt. Is  able to reach up with the left hand to the left side, top, and back of his head. 03/23/2022: Pt. is now able to more consistently initiate reaching up to his head with his left hand in preparation for haircare12/18/2023: Pt. is now able to more consistently initiate reaching up to his head with his left hand in preparation for haircare. Eval: Pt. is able to initiate reaching up for hair care with a long handled brush, however is unable to sustain UE's in elevation to perform the task.     Goal status: Ongoing  7. Pt. Will independently navigate the w/c through his environment with minA with visual scanning, and hand placement on the controls.  Baseline: 11/16/2022: Deferred 2/2 vision 09/26/2022: Pt. Is now navigating his w/c ouside the home, and around his block. 08/15/2022: Pt. Requires minA to setup hand on controls and MIN cues to navigate the w/c in wide spaces, requires MIN - MOD cues to navigate the w/c through more narrow doorways, and tighter turns - varies based on good vs bad vision days.07/20/22: Pt. Requires minA to setup hand on controls and MIN cues to navigate the w/c in wide spaces, requires MIN - MOD cues to navigate the w/c through more narrow doorways, and tighter turns - varies based on good vs bad vision days. 05/25/2022: Pt. Requires minA  and  cues to navigate the w/c in wide spaces, and requires Mod cues to navigate the w/c through more narrow doorways, and tighter turns. Pt. Requires max cues for scanning through the environment, and moderate cues for hand placement on the controls.     Goal status: Deferred 2/2 vision    8. Pt. Will improve bilateral grip  strength to be able to independently grasp, and pull up blankets, and linens.   Baseline: 11/16/2022: R: 0 L: 8# Pt. Is more consistently grasping his blankets and attempting to pull them up. 09/26/2022: R: 0  L: 8# Pt. is attempting to grasp, and pull blankets up more, using mostly the left hand. 08/15/2022: Pt. has difficulty securely holding, and pulling up blankets, and linens.    Goal status: Ongoing  9. Pt. will consistently actively control the releasing  of blankets, covers, and linens from his hands once they are in the desired position over him. Baseline: 11/16/2022: Pt. continues to improve with actively releasing blankets form his hands. 09/26/2022: Pt. is improving with actively releasing blankets form his hands. 08/15/2022: Pt. has difficulty with controlled releasing of blankets/linens from his grasp.     Goal status: Ongoing    10. Pt. Will improve bilateral UE strength by 2 MM grades to assist with ADLs, and IADLs  Baseline: 11/16/2022: Right: shoulder flexion: 3/5, abduction: 4/5, elbow flexion: 4+/5 , extension: 4+/5 wrist extension: 2/5; Left: shoulder flexion: 4+/5, abduction: 4+/5, elbow flexion: 5/5 , extension:  wrist extension: 3-/5 Right: shoulder flexion: 3+/5, abduction: 4/5, elbow flexion: 4+/5 , extension: 4+/5 wrist extension: 2/5; Left: shoulder flexion: 4+/5, abduction: 4+/5, elbow flexion: 5/5 , extension: 4+/5 wrist extension: 3-/5  11. Pt. Will improve right wrist extension by 10 degrees to initiate reaching for items in preparation for ADLs. Baseline: 11/16/2022: -30(10)    Goal status: New   ASSESSMENT: CLINICAL IMPRESSION:  Pt. continues to present with limited ROM, tightness, in the right 2/2  flexor tone, and tightness in the RUE, forearm, wrist, and digits.  Pt. presents with discomfort in the right 3rd, and 4th digit MPs, and PIPs. Pt. tolerated moist heat, ROM, and stretching well.  Pt. continues to work on improving BUE functioning, ROM, strength, motor  control, [facility] skills, and visual compensatory strategies to be able to independently pull up, and release blankets/covers, improve writing legibility, improve self-care including oral care, and utensil use during self-feeding tasks, and maximize independence with ADLs, and IADLs.     PERFORMANCE DEFICITS in functional skills including ADLs, IADLs, coordination, dexterity, proprioception, ROM, strength, pain, FMC, GMC, decreased knowledge of use of DME, and UE functional use, cognitive skills including safety awareness, and psychosocial skills including coping strategies, environmental adaptation, habits, and routines and behaviors.   IMPAIRMENTS are limiting patient from ADLs, IADLs, leisure, and social participation.   COMORBIDITIES may have co-morbidities  that affects occupational performance. Patient will benefit from skilled OT to address above impairments and improve overall function.  MODIFICATION OR ASSISTANCE TO COMPLETE EVALUATION: Maximum or significant modification of tasks or assist is necessary to complete an evaluation.  OT OCCUPATIONAL PROFILE AND HISTORY: Comprehensive assessment: Review of records and extensive additional review of physical, cognitive, psychosocial history related to current functional performance.  CLINICAL DECISION MAKING: High - multiple treatment options, significant modification of task necessary  REHAB POTENTIAL: Fair    EVALUATION COMPLEXITY: High    PLAN: OT FREQUENCY: 2 x's a week  OT DURATION:12 weeks  PLANNED INTERVENTIONS: self care/ADL training, therapeutic exercise, therapeutic activity, neuromuscular re-education, manual therapy, passive range of motion, patient/family education, and cognitive remediation/compensation RECOMMENDED OTHER SERVICES: PT  CONSULTED AND AGREED WITH PLAN OF CARE: Patient and family member/caregiver  PLAN FOR NEXT SESSION: Initiate treatment  Olegario Messier, MS, OTR/L   11/30/22, 5:27 PM

## 2022-11-30 NOTE — Therapy (Signed)
OUTPATIENT PHYSICAL THERAPY TREATMENT/RECERT    Patient Name: Paul Hall MRN: [id number] DOB:Jun 26, 1995, 27 y.o., male Today's Date: 12/01/2022  PCP:  Cindi Carbon PROVIDER:  Lenise Herald, PA-C   PT End of Session - 11/30/22 1722     Visit Number 122    Number of Visits 146    Date for PT Re-Evaluation 02/22/23    Authorization Type BCBS COMM Pro/ [place] Medicaid    Progress Note Due on Visit 130    PT Start Time 1554    PT Stop Time 1622    PT Time Calculation (min) 28 min    Activity Tolerance Patient tolerated treatment well;Patient limited by fatigue    Behavior During Therapy WFL for tasks assessed/performed                Past Medical History:  Diagnosis Date   Diabetes mellitus (HCC)    Hypertension    Stroke (HCC)    Past Surgical History:  Procedure Laterality Date   IVC FILTER PLACEMENT ([facility] HX)     LEG SURGERY     PEG TUBE PLACEMENT     TRACHEOSTOMY     Patient Active Problem List   Diagnosis Date Noted   Sepsis due to vancomycin resistant Enterococcus species (HCC) 06/06/2019   SIRS (systemic inflammatory response syndrome) (HCC) 06/05/2019   Acute lower UTI 06/05/2019   VRE (vancomycin-resistant Enterococci) infection 06/05/2019   Anemia 06/05/2019   Skin ulcer of sacrum with necrosis of muscle (HCC)    Urinary retention    Type 2 diabetes mellitus without complication, with long-term current use of insulin (HCC)    Tachycardia    Lower extremity edema    Acute metabolic encephalopathy    Obstructive sleep apnea    Morbid obesity with BMI of 60.0-69.9, adult (HCC)    Goals of care, counseling/discussion    Palliative care encounter    Sepsis (HCC) 04/27/2019   H/O insulin dependent diabetes mellitus 04/27/2019   History of CVA with residual deficit 04/27/2019   Seizure disorder (HCC) 04/27/2019   Decubitus ulcer of sacral region, stage 4 (HCC) 04/27/2019    REFERRING DIAG: Cerebral infarction, unspecified   THERAPY DIAG:   Muscle weakness (generalized)  Other lack of coordination  History of CVA with residual deficit  Abnormality of gait and mobility  Difficulty in walking, not elsewhere classified  Unsteadiness on feet  Rationale for Evaluation and Treatment Rehabilitation  PERTINENT HISTORY: Paul Hall is a 26yoM who presents with severe weakness, quadriparesis, altered sensorium, and visual impairment s/p critical illness and prolonged hospitalization. Pt hospitalized in October 2020 with ARDS 2/2 COVID19 infection. Pt sustained a complex and lengthy hospitalization which included tracheostomy, prolonged sedation, ECMO. In this period pt sustained CVA and SDH. Pt has now been liberated from tracheostomy and G-tube. Pt has since been hospitalized for wound infection and UTI. Pt lives with parents at home, has hospital bed and left chair, hoyer lift transfers, and power WC for mobility needs. Pt needs heavy physical assistance with ADL 2/2 BUE contractures and motor dysfunction   PRECAUTIONS: Fall  SUBJECTIVE:  Patient reports doing well- Transportation made [place] late today    PAIN:  Are you having pain? No   TODAY'S TREATMENT:  - 12/01/22   *patient dependently transferred from power w/c to mat table via Michiel Sites +2 assist (mother assist PT)   Physical therapy treatment session today consisted of completing assessment of goals and administration of testing as demonstrated and documented in flow sheet,  treatment, and goals section of this note. Addition treatments may be found below.     PATIENT EDUCATION: Education details: Progression of intensity at home with trunk strength exercises and how to grade intensity using a goniometer bubble app.   HOME EXERCISE PROGRAM:  Access Code: [email or web address] URL: [email or web address] Date: 03/23/2022 Prepared by: Paul Hall  Exercises - Supine Bridge  - 3 x weekly - 3 sets - 10 reps - 2 hold - Supine Gluteal Sets  - 3 x weekly - 3  sets - 10 reps - 5 sec hold - Supine Quad Set  - 1 x daily - 3 x weekly - 3 sets - 10 reps - 5 hold - Seated Long Arc Quad  - 1 x daily - 7 x weekly - 3 sets - 10 reps - Seated Hip Adduction Squeeze with Ball  - 1 x daily - 3 x weekly - 3 sets - 10 reps - 5 hold - Seated Hip Abduction  - 1 x daily - 3 x weekly - 3 sets - 10 reps - 2 hold    PT Short Term Goals -       PT SHORT TERM GOAL #1   Title Pt will be independent with HEP in order to improve strength and balance in order to decrease fall risk and improve function at home and work.    Baseline 01/04/2021= No formal HEP in place; 12/12 no HEP in place; 05/10/2021-Patient and his father were able to report compliance with curent HEP consisting of mostly seated/reclined LE strengthening. Both verbalize no questions at this time.    Time 6    Period Weeks    Status Achieved    Target Date 02/15/21            PT LONG TERM GOAL #1     Title Patient will increase BLE gross strength by 1/2 muscle grade to improve functional strength for improved independence with potential gait, increased standing tolerance and increased ADL ability.     Baseline 01/04/2021- Patient presents with 1/5 to 3-/5 B LE strength with MMT; 12/12: goal partially met for Left knee/hip; 05/10/2021= 2-/5 bilateal Hip flex; 3+/5 bilateral Knee ext; 06/21/2021= Patient presents with 2-/5 bilateral Hip flex; 3+/5 bilateral knee ext/flex; 2-/5 left ankle DF; 0/5 right ankle- and able to increase reps and resistance with LE's. 09/15/2021- Patient technically presents with 2-/5 B hip flex/abd/add - but he is able to raise his hip up to approx 100 deg which has improved. 3+/5 Bilateral knee ext, 2-/5 left ankle and 0/5 right ankle.  12/08/2021= Patient able to lift left knee at 110 deg of hip flex; presents with 3+/5 knee ext, 2-/5 left ankle DF and 0/5 right ankle DF, 2-/5 bilateral Hip abd in seated position.    12/6: R: knee 3+/5 ext, 2/5 flexion, left knee 3+/5 extension, 3+/5  flexion, R hip: 2+/5 hip add, 2+/5 hip ABD L hip: 4-/5 hip ABD, 3+/5 hip ADD, 3+/5 hip flexion; 06/06/2022= Patient now presents with 2-/5 right ankle DF/PF;     Time 12     Period Weeks     Status MET    Target Date 03/07/2022         PT LONG TERM GOAL #2    Title Patient will tolerate sitting unsupported demonstrating erect sitting posture for 15 minutes with CGA to demonstrate improved back extensor strength and improved sitting tolerance.     Baseline 01/04/2021- Patient confied to sitting in lift chair or electric  power chair with back support and unable to sit upright without physical assistance; 12/12: tolerates <1 minutes upright unsupported sitting. 05/10/2021=static sit with forward trunk lean  in his power wheelchair without back support x approx 3 min. 06/21/2021=Unable to assess today due to patient with acute back pain but on previous visit able to sit x 8 min without back support. 09/15/2021- on last visit- 09/13/2021- patient was able to sit unsupported x 8 min at edge of mat. 10/13/2023 - Patient was able to sit at edge of mat with varying level of assist today from SBA to min A for a total of 20 min. 12/13/2021= Patient demonstrated unsupported sitting at edge of mat for approx 20 min    Time 12     Period Weeks     Status GOAL MET    Target Date 12/08/21          PT LONG TERM GOAL #3    Title Patient will demonstrate ability to perform static standing in // bars > 2 min with Max Assist  without loss of balance and fair posture for improved overall strength for pre-gait and transfer activities.     Baseline 01/04/2021= Patient current uanble to stand- Dependent on hoyer or sit to stand lift for transfers. 05/10/2021=Not appropriate yet- Currently still dependent with all transfers using hoyer. 06/21/2021= Patient continuing now to focus on LE strengthening to prepare for standing-unable to try today due to acute low back pain-  planning on attempting in new cert period. 09/15/2021- Patient has  attempted standing 2x in past two week- max Assist of 2 people - only once was he successful to clearing his bottom from chair - Will continue to be a focus during the new certification. 12/13/2021= Patient has been limited secondary to increased overall low back pain during this certification and will require more time to focus on this goal.  12/6: not assessed this date, will assess at date when 2-3 PTs are present for assistance     Time 12     Period Weeks     Status GOAL not appropriate at this time - may attempt in future once Patient presents with improved overall LE strength.                 PT LONG TERM GOAL #4    Title Pt will improve FOTO score by 10 points or more demonstrating improved perceived functional ability     Baseline FOTO 7 on 10/17; 03/15/21: FOTO 12; 05/10/2021 06/21/2021= 1; 09/15/2021= 9; 12/13/2021= Will issue next visit 12/6: 4; 06/06/2022= Will assess next visit; Goal not appropriate as patient is not ambulatory.     Time 12     Period Weeks     Status WITHDRAWN    Target date 11/30/2022         PT LONG TERM GOAL #5    Title Patient will perform sit to stand transfer with appropriate AD and max assist of 2 people with 75% consistency to prepare for pregait activities.     Baseline 09/15/2021= Patient unable to stand well- unable to clear his bottom off chair with Max assist of 2 persons. 12/13/2021- Goal not appropriate to try yet but will keep and roll over to next cert as shift continues to focus on transfers/standing; 4/24= Patient able to perform active ankle DF/PF with right LE and able to raise his knee into seated march and clear floor without physical assist today - as previously unable as well as lift  right knee ext to near full ROM to improve strength for eventual standing. 09/07/2022- Goal continues to not be appropriate but patient is now standing some and will keep goal active; 11/23/2022= patient is now performing sit to stand with max A +2 and now able to clear his  bottom off mat. 11/30/2022- Patient continues to perform sit to stand transfers with Max A+2 and able to clear bottom well off mat but not erect yet-     Time 12     Period Weeks     Status PROGRESSING    Target date  02/22/2023       PT LONG TERM GOAL #6  Title Patient will tolerate sitting unsupported demonstrating erect sitting posture for 30 minutes with CGA to demonstrate improved back extensor strength and improved sitting tolerance.   Baseline 12/13/2021= Patient demonstrated unsupported sitting at edge of mat for approx 20 min;  12/29/2021- Patient performed approx 30 min of dynamic sitting activities today. 06/06/2022= Patient demonstrated ability to sit and perform static and dynamic UE/LE movement with only Supervision.   Time 12   Period Weeks   Status GOAL MET           7.  Patient will tolerate 2 minutes or more of standing in sit to stand lift or with max assist +2 in order to indicate improved lower extremity weightbearing tolerance for progression to standing in parallel bars. Baseline: 1 minute on most recent stand 02/21/22; 06/06/2022- Patient did attempt today after complaining of right LE pain last week. He attempted 3 stands using sit to stand lift. - all over 1 min- last one approx 48 sec- stopped due to fatigue. More erect standing in lift today - still poor gluteal strength but able to activate glutes and extend hips upon command but unable to hold > 5 sec. 07/28/2022- Will attempt standing next visit but patient has been able to stand since last progress note for up to 3 min at a time at his best. 09/07/2022-  attempted standing with max +2 and patient able to stand 15-20 sec so will now  revise goal; 11/23/2022- Patient at his best between last 2 visits was able to stand approx 50 sec with max A+2.  11/30/2022- Patient performed 4 trials of static standing - max A +2- clearing bottom and using forearms to bear weight  Goal status: Revised Target date: 02/22/2023   8. Patient  will tolerate sitting unsupported demonstrating ability to perform dynamic UE/LE activities for 30 minutes  independently to demonstrate ability to sit at edge of bed to eat or perform some ADL's/exercise for optimal quality of life.             Baseline: 06/06/2022- Patient able to sit and perform some UE/LE exercises but requires CGA at times for safety- he is able to static sit for 30 min with supervision. 07/27/2022= Patient able to now sit > 30 min with Supervision only - performing static and dynamic activities. Will keep goal active to ensure patient is consistent. 09/07/2022=  Patient able to demonstrate consistent ability to sit at edge of mat during visits             Goal Status: MET             Target date: 08/29/2022        Plan     Clinical Impression Statement  Patient arrived late due to transportation issues. Still able to test some long term goals- He presents with  much improved posture with standing and able to bear more weight. He is able to stand and activate improved quads and glutes and also standing with slightly less physical assist. Patient's condition has the potential to improve in response to therapy. Maximum improvement is yet to be obtained. The anticipated improvement is attainable and reasonable in a generally predictable time. Pt will continue to benefit from skilled physical therapy intervention to address impairments, improve QOL, and attain therapy goals.    Personal Factors and Comorbidities Comorbidity 3+;Time since onset of injury/illness/exacerbation    Comorbidities CVA, diabetes, Seizures    Examination-Activity Limitations Bathing;Bed Mobility;Bend;Caring for Others;Carry;Dressing;Hygiene/Grooming;Lift;Locomotion Level;Reach Overhead;Self Feeding;Sit;Squat;Stairs;Stand;Transfers;Toileting    Examination-Participation Restrictions Cleaning;Community Activity;Driving;Laundry;Medication Management;Meal Prep;Occupation;Personal Finances;Shop;Yard Work;Volunteer     Stability/Clinical Decision Making Evolving/Moderate complexity    Rehab Potential Fair    PT Frequency 2x / week    PT Duration 12 weeks    PT Treatment/Interventions ADLs/Self Care Home Management;Cryotherapy;Electrical Stimulation;Moist Heat;Ultrasound;DME Instruction;Gait training;Stair training;Functional mobility training;Therapeutic exercise;Balance training;Patient/family education;Orthotic Fit/Training;Neuromuscular re-education;Wheelchair mobility training;Manual techniques;Passive range of motion;Dry needling;Energy conservation;Taping;Visual/perceptual remediation/compensation;Joint Manipulations    PT Next Visit Plan core strength/motor control in short-sitting, sit to stand if able; Continue with progressive LE Strengthening. Stand activities as appropriate.     PT Home Exercise Plan No changes to HEP today    Consulted and Agree with Plan of Care Patient;Family member/caregiver    Family Member Consulted            10:49 AM, 12/01/22 Louis Meckel, PT Physical Therapist - [facility]  Outpatient Physical Therapy- [facility] [phone]      12/01/2022, 10:49 AM

## 2022-12-07 ENCOUNTER — Ambulatory Visit: Payer: BC Managed Care – PPO | Admitting: Occupational Therapy

## 2022-12-07 ENCOUNTER — Ambulatory Visit: Payer: BC Managed Care – PPO | Attending: Physician Assistant

## 2022-12-07 DIAGNOSIS — M6281 Muscle weakness (generalized): Secondary | ICD-10-CM | POA: Diagnosis not present

## 2022-12-07 DIAGNOSIS — R2681 Unsteadiness on feet: Secondary | ICD-10-CM | POA: Insufficient documentation

## 2022-12-07 DIAGNOSIS — R278 Other lack of coordination: Secondary | ICD-10-CM | POA: Insufficient documentation

## 2022-12-07 DIAGNOSIS — R262 Difficulty in walking, not elsewhere classified: Secondary | ICD-10-CM | POA: Diagnosis not present

## 2022-12-07 DIAGNOSIS — R269 Unspecified abnormalities of gait and mobility: Secondary | ICD-10-CM | POA: Diagnosis not present

## 2022-12-07 DIAGNOSIS — I6930 Unspecified sequelae of cerebral infarction: Secondary | ICD-10-CM | POA: Insufficient documentation

## 2022-12-07 NOTE — Therapy (Addendum)
OUTPATIENT PHYSICAL THERAPY TREATMENT    Patient Name: Paul Hall MRN: [id number] DOB:Jul 07, 1995, 27 y.o., male Today's Date: 12/07/2022  PCP:  Cindi Carbon PROVIDER:  Lenise Herald, PA-C   PT End of Session - 12/07/22 1532     Visit Number 123    Number of Visits 146    Date for PT Re-Evaluation 02/22/23    Authorization Type BCBS COMM Pro/  Medicaid    Progress Note Due on Visit 130    PT Start Time 1532    PT Stop Time 1614    PT Time Calculation (min) 42 min    Activity Tolerance Patient tolerated treatment well;Patient limited by fatigue    Behavior During Therapy WFL for tasks assessed/performed                Past Medical History:  Diagnosis Date   Diabetes mellitus (HCC)    Hypertension    Stroke (HCC)    Past Surgical History:  Procedure Laterality Date   IVC FILTER PLACEMENT ([facility] HX)     LEG SURGERY     PEG TUBE PLACEMENT     TRACHEOSTOMY     Patient Active Problem List   Diagnosis Date Noted   Sepsis due to vancomycin resistant Enterococcus species (HCC) 06/06/2019   SIRS (systemic inflammatory response syndrome) (HCC) 06/05/2019   Acute lower UTI 06/05/2019   VRE (vancomycin-resistant Enterococci) infection 06/05/2019   Anemia 06/05/2019   Skin ulcer of sacrum with necrosis of muscle (HCC)    Urinary retention    Type 2 diabetes mellitus without complication, with long-term current use of insulin (HCC)    Tachycardia    Lower extremity edema    Acute metabolic encephalopathy    Obstructive sleep apnea    Morbid obesity with BMI of 60.0-69.9, adult (HCC)    Goals of care, counseling/discussion    Palliative care encounter    Sepsis (HCC) 04/27/2019   H/O insulin dependent diabetes mellitus 04/27/2019   History of CVA with residual deficit 04/27/2019   Seizure disorder (HCC) 04/27/2019   Decubitus ulcer of sacral region, stage 4 (HCC) 04/27/2019    REFERRING DIAG: Cerebral infarction, unspecified   THERAPY DIAG:  Muscle  weakness (generalized)  Other lack of coordination  History of CVA with residual deficit  Abnormality of gait and mobility  Difficulty in walking, not elsewhere classified  Unsteadiness on feet  Rationale for Evaluation and Treatment Rehabilitation  PERTINENT HISTORY: Paul Hall is a 26yoM who presents with severe weakness, quadriparesis, altered sensorium, and visual impairment s/p critical illness and prolonged hospitalization. Pt hospitalized in October 2020 with ARDS 2/2 COVID19 infection. Pt sustained a complex and lengthy hospitalization which included tracheostomy, prolonged sedation, ECMO. In this period pt sustained CVA and SDH. Pt has now been liberated from tracheostomy and G-tube. Pt has since been hospitalized for wound infection and UTI. Pt lives with parents at home, has hospital bed and left chair, hoyer lift transfers, and power WC for mobility needs. Pt needs heavy physical assistance with ADL 2/2 BUE contractures and motor dysfunction   PRECAUTIONS: Fall  SUBJECTIVE:  Patient reports no new issues today. Denies any pain.     PAIN:  Are you having pain? No   TODAY'S TREATMENT:  - 12/07/22   *patient dependently transferred from power w/c to mat table via Michiel Sites +2 assist (mother assist PT)    Therapeutic activities:   Patient performed sitting at [facility]- focusing on dynamic ant weight shifting- attempting several min to lean  forward placing more weight through bilateral Forearms. He required increased VC to lean forward and try to "press down" with arms.  Attempted gripping PT hand with patient's left hand and having him pull up motion x several min.   Attempted standing with new strategy- holding onto PT hand/under hand versus previous forearm support - attempted 3 stands yet patient with max difficulty accepting more weight through left hand and unable to stand as well.  Reverted back to primary method of standing (supporting patient forearms). He was able to  stand multiple attempts yet unable to straighten his knees today for any effective static stand.  He then reported some insidious right ankle pain- no increased pain with all ROM- DF/PF/IV/EV-  yet transferred back to power chair via hoyer  PATIENT EDUCATION: Education details: Progression of intensity at home with trunk strength exercises and how to grade intensity using a goniometer bubble app.   HOME EXERCISE PROGRAM:  Access Code: [email or web address] URL: [email or web address] Date: 03/23/2022 Prepared by: Maureen Ralphs  Exercises - Supine Bridge  - 3 x weekly - 3 sets - 10 reps - 2 hold - Supine Gluteal Sets  - 3 x weekly - 3 sets - 10 reps - 5 sec hold - Supine Quad Set  - 1 x daily - 3 x weekly - 3 sets - 10 reps - 5 hold - Seated Long Arc Quad  - 1 x daily - 7 x weekly - 3 sets - 10 reps - Seated Hip Adduction Squeeze with Ball  - 1 x daily - 3 x weekly - 3 sets - 10 reps - 5 hold - Seated Hip Abduction  - 1 x daily - 3 x weekly - 3 sets - 10 reps - 2 hold    PT Short Term Goals -       PT SHORT TERM GOAL #1   Title Pt will be independent with HEP in order to improve strength and balance in order to decrease fall risk and improve function at home and work.    Baseline 01/04/2021= No formal HEP in place; 12/12 no HEP in place; 05/10/2021-Patient and his father were able to report compliance with curent HEP consisting of mostly seated/reclined LE strengthening. Both verbalize no questions at this time.    Time 6    Period Weeks    Status Achieved    Target Date 02/15/21            PT LONG TERM GOAL #1     Title Patient will increase BLE gross strength by 1/2 muscle grade to improve functional strength for improved independence with potential gait, increased standing tolerance and increased ADL ability.     Baseline 01/04/2021- Patient presents with 1/5 to 3-/5 B LE strength with MMT; 12/12: goal partially met for Left knee/hip; 05/10/2021= 2-/5 bilateal Hip flex; 3+/5  bilateral Knee ext; 06/21/2021= Patient presents with 2-/5 bilateral Hip flex; 3+/5 bilateral knee ext/flex; 2-/5 left ankle DF; 0/5 right ankle- and able to increase reps and resistance with LE's. 09/15/2021- Patient technically presents with 2-/5 B hip flex/abd/add - but he is able to raise his hip up to approx 100 deg which has improved. 3+/5 Bilateral knee ext, 2-/5 left ankle and 0/5 right ankle.  12/08/2021= Patient able to lift left knee at 110 deg of hip flex; presents with 3+/5 knee ext, 2-/5 left ankle DF and 0/5 right ankle DF, 2-/5 bilateral Hip abd in seated position.    12/6: R: knee 3+/5 ext, 2/5  flexion, left knee 3+/5 extension, 3+/5 flexion, R hip: 2+/5 hip add, 2+/5 hip ABD L hip: 4-/5 hip ABD, 3+/5 hip ADD, 3+/5 hip flexion; 06/06/2022= Patient now presents with 2-/5 right ankle DF/PF;     Time 12     Period Weeks     Status MET    Target Date 03/07/2022         PT LONG TERM GOAL #2    Title Patient will tolerate sitting unsupported demonstrating erect sitting posture for 15 minutes with CGA to demonstrate improved back extensor strength and improved sitting tolerance.     Baseline 01/04/2021- Patient confied to sitting in lift chair or electric power chair with back support and unable to sit upright without physical assistance; 12/12: tolerates <1 minutes upright unsupported sitting. 05/10/2021=static sit with forward trunk lean  in his power wheelchair without back support x approx 3 min. 06/21/2021=Unable to assess today due to patient with acute back pain but on previous visit able to sit x 8 min without back support. 09/15/2021- on last visit- 09/13/2021- patient was able to sit unsupported x 8 min at edge of mat. 10/13/2023 - Patient was able to sit at edge of mat with varying level of assist today from SBA to min A for a total of 20 min. 12/13/2021= Patient demonstrated unsupported sitting at edge of mat for approx 20 min    Time 12     Period Weeks     Status GOAL MET    Target Date  12/08/21          PT LONG TERM GOAL #3    Title Patient will demonstrate ability to perform static standing in // bars > 2 min with Max Assist  without loss of balance and fair posture for improved overall strength for pre-gait and transfer activities.     Baseline 01/04/2021= Patient current uanble to stand- Dependent on hoyer or sit to stand lift for transfers. 05/10/2021=Not appropriate yet- Currently still dependent with all transfers using hoyer. 06/21/2021= Patient continuing now to focus on LE strengthening to prepare for standing-unable to try today due to acute low back pain-  planning on attempting in new cert period. 09/15/2021- Patient has attempted standing 2x in past two week- max Assist of 2 people - only once was he successful to clearing his bottom from chair - Will continue to be a focus during the new certification. 12/13/2021= Patient has been limited secondary to increased overall low back pain during this certification and will require more time to focus on this goal.  12/6: not assessed this date, will assess at date when 2-3 PTs are present for assistance     Time 12     Period Weeks     Status GOAL not appropriate at this time - may attempt in future once Patient presents with improved overall LE strength.                 PT LONG TERM GOAL #4    Title Pt will improve FOTO score by 10 points or more demonstrating improved perceived functional ability     Baseline FOTO 7 on 10/17; 03/15/21: FOTO 12; 05/10/2021 06/21/2021= 1; 09/15/2021= 9; 12/13/2021= Will issue next visit 12/6: 4; 06/06/2022= Will assess next visit; Goal not appropriate as patient is not ambulatory.     Time 12     Period Weeks     Status WITHDRAWN    Target date 11/30/2022  PT LONG TERM GOAL #5    Title Patient will perform sit to stand transfer with appropriate AD and max assist of 2 people with 75% consistency to prepare for pregait activities.     Baseline 09/15/2021= Patient unable to stand well- unable to  clear his bottom off chair with Max assist of 2 persons. 12/13/2021- Goal not appropriate to try yet but will keep and roll over to next cert as shift continues to focus on transfers/standing; 4/24= Patient able to perform active ankle DF/PF with right LE and able to raise his knee into seated march and clear floor without physical assist today - as previously unable as well as lift right knee ext to near full ROM to improve strength for eventual standing. 09/07/2022- Goal continues to not be appropriate but patient is now standing some and will keep goal active; 11/23/2022= patient is now performing sit to stand with max A +2 and now able to clear his bottom off mat. 11/30/2022- Patient continues to perform sit to stand transfers with Max A+2 and able to clear bottom well off mat but not erect yet-     Time 12     Period Weeks     Status PROGRESSING    Target date  02/22/2023       PT LONG TERM GOAL #6  Title Patient will tolerate sitting unsupported demonstrating erect sitting posture for 30 minutes with CGA to demonstrate improved back extensor strength and improved sitting tolerance.   Baseline 12/13/2021= Patient demonstrated unsupported sitting at edge of mat for approx 20 min;  12/29/2021- Patient performed approx 30 min of dynamic sitting activities today. 06/06/2022= Patient demonstrated ability to sit and perform static and dynamic UE/LE movement with only Supervision.   Time 12   Period Weeks   Status GOAL MET           7.  Patient will tolerate 2 minutes or more of standing in sit to stand lift or with max assist +2 in order to indicate improved lower extremity weightbearing tolerance for progression to standing in parallel bars. Baseline: 1 minute on most recent stand 02/21/22; 06/06/2022- Patient did attempt today after complaining of right LE pain last week. He attempted 3 stands using sit to stand lift. - all over 1 min- last one approx 48 sec- stopped due to fatigue. More erect standing  in lift today - still poor gluteal strength but able to activate glutes and extend hips upon command but unable to hold > 5 sec. 07/28/2022- Will attempt standing next visit but patient has been able to stand since last progress note for up to 3 min at a time at his best. 09/07/2022-  attempted standing with max +2 and patient able to stand 15-20 sec so will now  revise goal; 11/23/2022- Patient at his best between last 2 visits was able to stand approx 50 sec with max A+2.  11/30/2022- Patient performed 4 trials of static standing - max A +2- clearing bottom and using forearms to bear weight  Goal status: Revised Target date: 02/22/2023   8. Patient will tolerate sitting unsupported demonstrating ability to perform dynamic UE/LE activities for 30 minutes  independently to demonstrate ability to sit at edge of bed to eat or perform some ADL's/exercise for optimal quality of life.             Baseline: 06/06/2022- Patient able to sit and perform some UE/LE exercises but requires CGA at times for safety- he is able  to static sit for 30 min with supervision. 07/27/2022= Patient able to now sit > 30 min with Supervision only - performing static and dynamic activities. Will keep goal active to ensure patient is consistent. 09/07/2022=  Patient able to demonstrate consistent ability to sit at edge of mat during visits             Goal Status: MET             Target date: 08/29/2022        Plan     Clinical Impression Statement  Treatment shifted to practice more UE Support with standing today. Patient was unable to place much weight through Left UE and more difficulty standing today. Will continue with UE support activities to assist LE with standing.  Pt will continue to benefit from skilled physical therapy intervention to address impairments, improve QOL, and attain therapy goals.    Personal Factors and Comorbidities Comorbidity 3+;Time since onset of injury/illness/exacerbation    Comorbidities CVA, diabetes,  Seizures    Examination-Activity Limitations Bathing;Bed Mobility;Bend;Caring for Others;Carry;Dressing;Hygiene/Grooming;Lift;Locomotion Level;Reach Overhead;Self Feeding;Sit;Squat;Stairs;Stand;Transfers;Toileting    Examination-Participation Restrictions Cleaning;Community Activity;Driving;Laundry;Medication Management;Meal Prep;Occupation;Personal Finances;Shop;Yard Work;Volunteer    Stability/Clinical Decision Making Evolving/Moderate complexity    Rehab Potential Fair    PT Frequency 2x / week    PT Duration 12 weeks    PT Treatment/Interventions ADLs/Self Care Home Management;Cryotherapy;Electrical Stimulation;Moist Heat;Ultrasound;DME Instruction;Gait training;Stair training;Functional mobility training;Therapeutic exercise;Balance training;Patient/family education;Orthotic Fit/Training;Neuromuscular re-education;Wheelchair mobility training;Manual techniques;Passive range of motion;Dry needling;Energy conservation;Taping;Visual/perceptual remediation/compensation;Joint Manipulations    PT Next Visit Plan core strength/motor control in short-sitting, sit to stand if able; Continue with progressive LE Strengthening. Stand activities as appropriate.     PT Home Exercise Plan No changes to HEP today    Consulted and Agree with Plan of Care Patient;Family member/caregiver    Family Member Consulted            4:53 PM, 12/07/22 Louis Meckel, PT Physical Therapist - [facility]  Outpatient Physical Therapy- [facility] [phone]      12/07/2022, 4:53 PM

## 2022-12-07 NOTE — Therapy (Addendum)
Occupational Therapy Neuro Treatment Note  Patient Name: Paul Hall MRN: [id number] DOB:[birth year], 27 y.o., male Today's Date: 12/07/2022  PCP: Dr. Sherwood Gambler REFERRING PROVIDER: Dr. Sherwood Gambler   OT End of Session - 12/07/22 1712     Visit Number [id number]    Number of Visits 84    Date for OT Re-Evaluation 02/08/23    OT Start Time 1620    OT Stop Time 1705    OT Time Calculation (min) 45 min    Activity Tolerance Patient tolerated treatment well    Behavior During Therapy WFL for tasks assessed/performed                 Past Medical History:  Diagnosis Date   Diabetes mellitus (HCC)    Hypertension    Stroke [facility])    Past Surgical History:  Procedure Laterality Date   IVC FILTER PLACEMENT ([facility] HX)     LEG SURGERY     PEG TUBE PLACEMENT     TRACHEOSTOMY     Patient Active Problem List   Diagnosis Date Noted   Sepsis due to vancomycin resistant Enterococcus species (HCC) 06/06/2019   SIRS (systemic inflammatory response syndrome) (HCC) 06/05/2019   Acute lower UTI 06/05/2019   VRE (vancomycin-resistant Enterococci) infection 06/05/2019   Anemia 06/05/2019   Skin ulcer of sacrum with necrosis of muscle (HCC)    Urinary retention    Type 2 diabetes mellitus without complication, with long-term current use of insulin (HCC)    Tachycardia    Lower extremity edema    Acute metabolic encephalopathy    Obstructive sleep apnea    Morbid obesity with BMI of 60.0-69.9, adult (HCC)    Goals of care, counseling/discussion    Palliative care encounter    Sepsis (HCC) 04/27/2019   H/O insulin dependent diabetes mellitus 04/27/2019   History of CVA with residual deficit 04/27/2019   Seizure disorder (HCC) 04/27/2019   Decubitus ulcer of sacral region, stage 4 (HCC) 04/27/2019   ONSET DATE: 01/2019  REFERRING DIAG: CVA/COVID-19  THERAPY DIAG:  Muscle weakness (generalized)  Rationale for Evaluation and Treatment Rehabilitation  SUBJECTIVE:   SUBJECTIVE  STATEMENT:  Pt. reports that his grandmother did not come to visit this past weekend.   Pt accompanied by: self and family member  PERTINENT HISTORY:  Pt. is a 28 y.o. male who was diagnosed with COVID-19, and CVA with resultant quadriplegia. Pt. Was then hospitalized with VRE UTI. PMHx includes: urinary retention, seizure disorder, obstructive sleep disorder, DM Type II, Morbid obesity.   PRECAUTIONS: None  WEIGHT BEARING RESTRICTIONS No  PAIN:  Are you having pain? Discomfort/tightness with reps of ROM in the right elbow, and in the right digits.   FALLS: Has patient fallen in last 6 months? No  LIVING ENVIRONMENT: Lives with: lives with their family Lives in: House/apartment Stairs: No Level Entry Has following equipment at home: Wheelchair (power) and hoyer lift, sit to stand lift  PLOF: Independent  PATIENT GOALS: To be able to engage in more daily care tasks.  OBJECTIVE:   HAND DOMINANCE: Right  ADLs:  Transfers/ambulation related to ADLs: Eating: Pt. reports being able to hold standard utensils, and is starting to engage more in self-feeding tasks, hand to mouth patterns. Pt. Reports that he does as much as he can with the task, and family assists  with the remainder of the task. Grooming: Pt. Is able to initiate holding an electric toothbrush, and brush his teeth. Family assists LB Dressing: Total Assist  UE dressing: Pt. is now able to reach up to actively assist with grasping , and pulling his gown down. Toileting:  Total Assist Bathing: MaxA UB, Total assist LB Tub Shower transfers: N/A Equipment: See above    IADLs: Shopping: Relies on family to assist Light housekeeping: Total Assist Meal Prep: Total Assist Community mobility:   Medication management:  Total Assist  Financial management: N/A Handwriting: Not legible: Pt. Is able to hold a pen with the left hand, and initiate marking the page. Pt.'s eye glasses were not available  MOBILITY STATUS:   Power w/c  POSTURE COMMENTS:  Pt. Requires position changes in his power w/c  ACTIVITY TOLERANCE: Activity tolerance:  Fair  FUNCTIONAL OUTCOME MEASURES: FOTO: 40  TR score: 45  05/25/2022:   FOTO: 50 TR score: 45  07/20/22: FOTO 55  UPPER EXTREMITY ROM     Active ROM Right eval Right 03/21/2022 Right 05/25/2022 R  07/20/22 Right  07/25/22 Right 08/15/2022 Right 09/26/2022 Right Left eval Left  03/21/2022 Left 05/25/2022 L  07/20/22 Left 07/25/2022 Left  08/15/2022 Left  09/26/2022 Left  11/16/2022  Shoulder flexion 106 scaption 105  Scaption 62 flexion 110 scaption 100  105 105 105 119 123 125 130  136 138 130  Shoulder abduction 114 90 97 100  105 117 120 110 115 115 115  118 121 125  Shoulder adduction                  Shoulder extension                  Shoulder internal rotation                  Shoulder external rotation                  Elbow flexion 120(130) 145 145 140  144 140 142 135(135) 145 145 145  145    Elbow extension -45(-35) -22(-35) -28(-22)  -32(-21) -32(-28) -28(-26) -28(-28) -27(-18) -21(-20) -20(-16)  -25(-10) -23(-10) -21(-10) -20(-18)  Wrist flexion                  Wrist extension -30(10) -25(10) -10(30)  -10(20) -10(25) -20(40) -30(10) 10(50) 19(50) 28(50)  30(50) 35(55) 36(55) 36(60)  Wrist ulnar deviation                  Wrist radial deviation                  Wrist pronation                  Wrist supination   Limited by flexor tone        Limited by flexor tone       (Blank rows = not tested)  UPPER EXTREMITY MMT:     Right eval Right 03/21/2022 Right  05/25/2022 R 07/20/22 Right 08/25/2022 Right 09/26/2022 Right 11/16/2022 Left eval Left 03/21/2022 Left  05/25/2022 L 07/20/22 Left  08/15/2022 Left 09/26/2022 Left 11/16/2022  Shoulder flexion 3-/5 scaption 3-/5 3-/5 3+/5 3+/5 3+/5 3/5 3-/5 3/5 3+/5 4+/5 4+/5 4+/5 4+/5  Shoulder abduction 3-/5 3-/5 3-/5 4/5 4/5 4/5 4/5 3-/5 3-/5 4-/5 4+/5 4+/5 4+5 4+/5  Shoulder adduction                 Shoulder extension                Shoulder internal rotation  Shoulder external rotation                Elbow flexion 3/5 3/5 3+/5 4/5 4+/5 4+/5 4+/5 3/5 3+/5 4-/5 4+/5 5/5 5/5 5/5  Elbow extension 2-/5 2/5 3-/5 4+/5 4+/5 4+/5 4+/5 2-/5 2/5  4+/5 4+/5 4+/5   Wrist flexion                Wrist extension 2-/5 2-/5 2/5 2/5 2/5 2/5 2/5 2/5 2+/5 3-/5  3-/5 3-/5 3-/5  Wrist ulnar deviation                Wrist radial deviation                Wrist pronation                Wrist supination                (Blank rows = not tested)   HAND FUNCTION:  Grip strength: Right: 0 lbs; Left: 0 lbs and Lateral pinch: Right: 5 lbs, Left: 2 lbs  03/21/2022: Lateral pinch: Right: 3.5 lbs, Left: 2 lbs  07/20/22: Grip strength: Right: 3 lbs; Left: 4 lbs and Lateral pinch: Right: 5 lbs, Left: 3 lbs  08/15/2022:  Grip strength: Right: 0 lbs; Left: 5 lbs and Lateral pinch: Right: 2 lbs, Left: 4 lbs  09/26/2022  Grip strength: Right: 0 lbs; Left: 8 lbs and Lateral pinch: Right: 2 lbs, Left: 5 lbs  11/16/2022  Grip strength: Right: 0 lbs; Left: 8 lbs   Bilateral digit PIP/DIP flexion contractures with MP hyperextension with attempts for AROM. Pt. is able to tolerate AROM to the bilateral digits at the initial evaluation however, has a history of pain in the digits.  COORDINATION: Eval: Pt. is unable to grasp 9-hole test pegs. Pt. is able to initiate grasping larger pegs, and is able to hold a pen in the left hand.  07/20/22: 2 min 36 seconds to remove 9 pegs from 9 hole peg test - cues to locate pegs 2/2 low vision. Pt. is able to initiate grasping larger pegs on R hand and is able to hold a pen in the left hand.  08/15/2022:      Vision, and sensation limiting accuracy of 9 hole peg test results. Pt. Was able to grasp and remove vertical pegs intermittently with cues.    SENSATION: Light touch: Impaired   EDEMA:  N/A  MUSCLE TONE: BUE flexor Spasticity  COGNITION Overall  cognitive status: Continue to assess in functional context  VISION:   Subjective report: Pt. was not wearing glasses at the time of the initial eval.  Baseline vision: Vision is very limited. Wears glasses all the time Visual history: History of impaired vision following CVA. Pt. Has received treatment through the [facility] low vision rehabilitation program.   VISION ASSESSMENT: Impaired To be further assessed in functional context  PERCEPTION: Impaired   PRAXIS: Impaired: motor planning  OBSERVATIONS:  Pt reports being on Tramadol   TODAY'S TREATMENT   Therapeutic Ex.:  Emphasis was placed on slow prolonged gentle stretching for RUE, forearm supination, wrist extension, wrist extension, digit MP, PIP, and DIP extension following moist heat. ROM was performed to decrease stiffness, and prepare the RUE for functional use. ROM was performed following 5 min. for moist heat to the right elbow, forearm, wrist, and digits. PROM for Left digit MP, PIP, and DIP extension.  Manual Therapy:  Pt. tolerated soft tissue massage to the right digit lateral,  and sagittal bands 2/2 stiffness, and discomfort in preparation and independent of ROM/there. Ex.      PATIENT EDUCATION: Education details: Doctor, general practice and fork positioning for self feeding Person educated: Patient and Parent Education method: Explanation, Demonstration, Tactile cues, and Verbal cues Education comprehension: verbalized understanding, returned demonstration, verbal cues required, and needs further education  HOME EXERCISE PROGRAM:  Continue ongoing assessment, and continue to provide as needed.   GOALS: Goals reviewed with patient? Yes  SHORT TERM GOALS: Target date: 11/07/2022   To assess splint fit, and make appropriate adjustments to promote good skin integrity through the palmar surface of the bilateral hands.  Baseline: 05/25/22: Goal currently met, however ongoing as needs to assess splint fit arise. 03/23/2022:  Pt. is wearing splints a couple of hours at night bilateral resting hand splints. 03/21/2022: Pt. is wearing splints a couple of hours at night bilateral resting hand splints. Goal status: Deferred   LONG TERM GOALS: Target date: 02/08/2023    FOTO score will Improve by 2 points for Pt. perceived improvement with the assessment specific ADL/IADL tasks.  Baseline: 11/16/2022: 52 09/27/2022: 51 07/20/2022: FOTO 55 05/25/2022: FOTO score: 50,  TR score: 45 Eval: FOTO score: 40,  TR score: 45 Goal status: Achieved   2.   Pt. will independently perform oral care for 100% of the task after complete set-up. Baseline: 11/16/2022: Pt. Completes 90% of the task with complete set-up. 09/26/2022: Completes 90% of the task, proximal assist at times required at the elbow.  08/15/2022: Completes 90% of the task, proximal assist at times required at the elbow. 07/20/22: completes 90% of task, limited by shoulder flexion. 05/25/2022:  Pt. Is able to initiate and perform oral care for approximately 90% of the task. Complete set-up required. Assi needed only for the very back teeth. 03/23/2022: Pt. Is able to initiate and perform oral care for approximately 75% of the task. Pt. Requires assist at proximally at the elbow for through oral care. 03/21/2022: Pt. Is able to initiate and perform oral care for approximately 75% of the task. Pt. Requires assist at proximally at the elbow for through oral care. Eval: Pt. is able to initiate using an electric toothbrush. Pt. requires assist for set-up, and assist for thoroughness, and as he Pt. fatigues. Goal status: Ongoing  3.  Pt. Will be modified independence with self-feeding for 75% using a swivel spoon, and universal fork after complete set-up Baseline: 11/16/2022: 100% with finger foods, Pt. Is initiating with a swivel spoon, and universal cuff for fork use, however requires assist for consistency, and accuracy.  09/26/2022: 90% using the spoon, assist at time with the forearm  motion, and grip on the utensils. 100% for finger foods.  08/15/2022:  90% using the spoon, assit at time with the forearm motion, and grip on the utensils. 100% for finger foods-independent with set-up- unsupervised.  07/20/22: self-feeds cereal using spoon 90% of task. 05/25/2022: Pt. Is able to use a spoon to scoop cereal when feeding himself cereal 85% of the time. Pt. Is able to feed himself snack/finger foods 100% of the time. Pt. Continues to work on consistency of  stabilizing a cup/mug when drinking. Pt. Is able to grasp a water bottle with assist initially, with assist tapering off as he drinks.03/23/2022: Pt. Is able to perform scooping cereal for 75% of the time. Pt. required assist, and support at the left elbow, and Pt. Presents with limited forearm supination when using the spoon, and bringing it towards his mouth.  Pt. Is able to use a fork to spear items, and perform the hand to mouth pattern.  03/21/2022: Pt. Is able to perform scooping cereal for 75% of the time. Pt. required assist, and support at the left elbow, and Pt. presents with limited forearm supination when using the spoon, and bringing it towards his mouth. Pt. Is able to use a fork to spear items, and perform the hand to mouth pattern.  Eval: Pt. is able to hold standard standard utensils. Pt. Performs as much of the task as he, can and has assistance for the remainder. Goal status:  Revised  4.  Pt. Will improve grasp patterns and consistently grasp 1/4" objects for ADL, and IADL tasks.  Baseline: 11/16/2022: Continues to grasp 1/4" pegs with min a + visual cues, consistently grasping 1/2" objects with visual cues 09/26/2022: Continues to grasp 1/4" pegs with min a + visual cues, consistently grasping 1/2" objects with visual cues. 08/15/2022: grasps 1/4" pegs with min a + visual cues, consistently grasping 1/2" objects with visual cues. 07/20/22: grasps 1/4" pegs with min a + visual cues, consistently grasping 1/2" objects with visual  cues. 05/25/2022: Pt. Is working on improving consistency of grasping 1/2" objects with visual cues.  03/23/2022: Pt. Is able to grasp 1" objects consistently,and continues to work on the hand patterns needed to grasp 1/2" objects.03/21/2022: Pt. Is able to grasp 1" objects consistently,and continues to work on the hand patterns needed to grasp 1/2" objects. Eval: Pt. is able to grasp 1" objects intermittently using a lateral grasp pattern. Goal status: Progressing  5.  Pt. will independently write his name legibly with letter sizes under 1". Baseline: 11/16/2022: Pt. Continues to be able to write name with smaller letter size. Pt. Is signing name with a computer styus. Pt. Requires visual cues, and assist 09/26/2022: Pt. Continues to be able to write name with smaller letter size. Pt. Is signing name with a computer styus. Pt. Requires visual cues, and assist. 08/15/2022: Pt. Is able to write name with smaller letter size. Pt. Is signing name with a computer styus. Pt. Requires visual cues, and assist. 07/20/22: stabilizing assist to write name with 75% legibility with 2" letters. 05/25/2022: Pt. to continue to work towards formulating grasp patterns in preparation for grasping a large width pen. Pt. Requires visual cues. 03/23/2022: Pt. Is able to write his name with modA, however has difficulty with formulating letter sizes less than 2" in size with 50% legibility for the 3 letters of his name.03/21/2022: Pt. Is able to write his name with modA, however has difficulty with formulating letter sizes less than 2" in size with 50% legibility for the 3 letters of his name. Eval: Pt is able to hold a thin marker with his left hand, and formulate a line, and initiate a circular pattern (Pt. without glasses today) Goal status: Progressing  6. Pt. Will reach up to comb/brush his hair with minA.  Baseline: 11/16/2022: Pt. Is able to use a hair pick for the left side of his head using his left hand. Pt. Presents with  difficulty reaching to the right side of his head.09/26/2022: Pt. Is able to use a hair pick for the left side of his head using his left hand. Pt. Presents with difficulty reaching to the right side of his head. 5/13/202: 75% using a hair pick, assist to the right side of the head. 07/20/22: reaches 75% of head, assist for far R side of head, fatigues quickly. 05/25/2022: Pt. Is  able to reach up with the left hand to the left side, top, and back of his head. 03/23/2022: Pt. is now able to more consistently initiate reaching up to his head with his left hand in preparation for haircare12/18/2023: Pt. is now able to more consistently initiate reaching up to his head with his left hand in preparation for haircare. Eval: Pt. is able to initiate reaching up for hair care with a long handled brush, however is unable to sustain UE's in elevation to perform the task.     Goal status: Ongoing  7. Pt. Will independently navigate the w/c through his environment with minA with visual scanning, and hand placement on the controls.  Baseline: 11/16/2022: Deferred 2/2 vision 09/26/2022: Pt. Is now navigating his w/c ouside the home, and around his block. 08/15/2022: Pt. Requires minA to setup hand on controls and MIN cues to navigate the w/c in wide spaces, requires MIN - MOD cues to navigate the w/c through more narrow doorways, and tighter turns - varies based on good vs bad vision days.07/20/22: Pt. Requires minA to setup hand on controls and MIN cues to navigate the w/c in wide spaces, requires MIN - MOD cues to navigate the w/c through more narrow doorways, and tighter turns - varies based on good vs bad vision days. 05/25/2022: Pt. Requires minA  and  cues to navigate the w/c in wide spaces, and requires Mod cues to navigate the w/c through more narrow doorways, and tighter turns. Pt. Requires max cues for scanning through the environment, and moderate cues for hand placement on the controls.     Goal status: Deferred 2/2  vision    8. Pt. Will improve bilateral grip strength to be able to independently grasp, and pull up blankets, and linens.   Baseline: 11/16/2022: R: 0 L: 8# Pt. Is more consistently grasping his blankets and attempting to pull them up. 09/26/2022: R: 0  L: 8# Pt. is attempting to grasp, and pull blankets up more, using mostly the left hand. 08/15/2022: Pt. has difficulty securely holding, and pulling up blankets, and linens.    Goal status: Ongoing  9. Pt. will consistently actively control the releasing  of blankets, covers, and linens from his hands once they are in the desired position over him. Baseline: 11/16/2022: Pt. continues to improve with actively releasing blankets form his hands. 09/26/2022: Pt. is improving with actively releasing blankets form his hands. 08/15/2022: Pt. has difficulty with controlled releasing of blankets/linens from his grasp.     Goal status: Ongoing    10. Pt. Will improve bilateral UE strength by 2 MM grades to assist with ADLs, and IADLs  Baseline: 11/16/2022: Right: shoulder flexion: 3/5, abduction: 4/5, elbow flexion: 4+/5 , extension: 4+/5 wrist extension: 2/5; Left: shoulder flexion: 4+/5, abduction: 4+/5, elbow flexion: 5/5 , extension:  wrist extension: 3-/5 Right: shoulder flexion: 3+/5, abduction: 4/5, elbow flexion: 4+/5 , extension: 4+/5 wrist extension: 2/5; Left: shoulder flexion: 4+/5, abduction: 4+/5, elbow flexion: 5/5 , extension: 4+/5 wrist extension: 3-/5  11. Pt. Will improve right wrist extension by 10 degrees to initiate reaching for items in preparation for ADLs. Baseline: 11/16/2022: -30(10)    Goal status: New   ASSESSMENT: CLINICAL IMPRESSION:  Pt. continues to present with limited ROM, tightness, in the right 2/2  flexor tone, and tightness in the RUE, forearm, wrist, and digits.  Pt. presents with tightness discomfort in the right 3rd, and 4th digit MPs, and PIPs. Pt. tolerated moist heat, ROM, and stretching  well. Pt. continues to work  on improving BUE functioning, ROM, strength, motor control, [facility] skills, and visual compensatory strategies to be able to independently pull up, and release blankets/covers, improve writing legibility, improve self-care including oral care, and utensil use during self-feeding tasks, and maximize independence with ADLs, and IADLs.     PERFORMANCE DEFICITS in functional skills including ADLs, IADLs, coordination, dexterity, proprioception, ROM, strength, pain, FMC, GMC, decreased knowledge of use of DME, and UE functional use, cognitive skills including safety awareness, and psychosocial skills including coping strategies, environmental adaptation, habits, and routines and behaviors.   IMPAIRMENTS are limiting patient from ADLs, IADLs, leisure, and social participation.   COMORBIDITIES may have co-morbidities  that affects occupational performance. Patient will benefit from skilled OT to address above impairments and improve overall function.  MODIFICATION OR ASSISTANCE TO COMPLETE EVALUATION: Maximum or significant modification of tasks or assist is necessary to complete an evaluation.  OT OCCUPATIONAL PROFILE AND HISTORY: Comprehensive assessment: Review of records and extensive additional review of physical, cognitive, psychosocial history related to current functional performance.  CLINICAL DECISION MAKING: High - multiple treatment options, significant modification of task necessary  REHAB POTENTIAL: Fair    EVALUATION COMPLEXITY: High    PLAN: OT FREQUENCY: 2 x's a week  OT DURATION:12 weeks  PLANNED INTERVENTIONS: self care/ADL training, therapeutic exercise, therapeutic activity, neuromuscular re-education, manual therapy, passive range of motion, patient/family education, and cognitive remediation/compensation RECOMMENDED OTHER SERVICES: PT  CONSULTED AND AGREED WITH PLAN OF CARE: Patient and family member/caregiver  PLAN FOR NEXT SESSION: Initiate treatment  Olegario Messier,  MS, OTR/L   12/07/22, 5:21 PM

## 2022-12-12 ENCOUNTER — Ambulatory Visit: Payer: BC Managed Care – PPO

## 2022-12-12 ENCOUNTER — Ambulatory Visit: Payer: BC Managed Care – PPO | Admitting: Occupational Therapy

## 2022-12-14 ENCOUNTER — Ambulatory Visit: Payer: BC Managed Care – PPO | Admitting: Occupational Therapy

## 2022-12-14 ENCOUNTER — Ambulatory Visit: Payer: BC Managed Care – PPO

## 2022-12-14 DIAGNOSIS — R278 Other lack of coordination: Secondary | ICD-10-CM

## 2022-12-14 DIAGNOSIS — R269 Unspecified abnormalities of gait and mobility: Secondary | ICD-10-CM | POA: Diagnosis not present

## 2022-12-14 DIAGNOSIS — M6281 Muscle weakness (generalized): Secondary | ICD-10-CM

## 2022-12-14 DIAGNOSIS — I6930 Unspecified sequelae of cerebral infarction: Secondary | ICD-10-CM

## 2022-12-14 DIAGNOSIS — R262 Difficulty in walking, not elsewhere classified: Secondary | ICD-10-CM

## 2022-12-14 DIAGNOSIS — R2681 Unsteadiness on feet: Secondary | ICD-10-CM | POA: Diagnosis not present

## 2022-12-14 NOTE — Therapy (Signed)
OUTPATIENT PHYSICAL THERAPY TREATMENT    Patient Name: Paul Hall MRN: [id number] DOB:1995-05-21, 27 y.o., male Today's Date: 12/14/2022  PCP:  Paul Hall PROVIDER:  Lenise Herald, PA-C   PT End of Session - 12/14/22 1738     Visit Number 124    Number of Visits 146    Date for PT Re-Evaluation 02/22/23    Authorization Type [employer] COMM Pro/ [place] [employer]    Progress Note Due on Visit 130    PT Start Time 1531    PT Stop Time 1615    PT Time Calculation (min) 44 min    Equipment Utilized During Treatment Gait belt    Activity Tolerance Patient tolerated treatment well;Patient limited by fatigue    Behavior During Therapy WFL for tasks assessed/performed                Past Medical History:  Diagnosis Date   Diabetes mellitus (HCC)    Hypertension    Stroke (HCC)    Past Surgical History:  Procedure Laterality Date   IVC FILTER PLACEMENT ([facility] HX)     LEG SURGERY     PEG TUBE PLACEMENT     TRACHEOSTOMY     Patient Active Problem List   Diagnosis Date Noted   Sepsis due to vancomycin resistant Enterococcus species (HCC) 06/06/2019   SIRS (systemic inflammatory response syndrome) (HCC) 06/05/2019   Acute lower UTI 06/05/2019   VRE (vancomycin-resistant Enterococci) infection 06/05/2019   Anemia 06/05/2019   Skin ulcer of sacrum with necrosis of muscle (HCC)    Urinary retention    Type 2 diabetes mellitus without complication, with long-term current use of insulin (HCC)    Tachycardia    Lower extremity edema    Acute metabolic encephalopathy    Obstructive sleep apnea    Morbid obesity with BMI of 60.0-69.9, adult (HCC)    Goals of care, counseling/discussion    Palliative care encounter    Sepsis (HCC) 04/27/2019   H/O insulin dependent diabetes mellitus 04/27/2019   History of CVA with residual deficit 04/27/2019   Seizure disorder (HCC) 04/27/2019   Decubitus ulcer of sacral region, stage 4 (HCC) 04/27/2019    REFERRING DIAG: Cerebral  infarction, unspecified   THERAPY DIAG:  Muscle weakness (generalized)  Other lack of coordination  History of CVA with residual deficit  Abnormality of gait and mobility  Difficulty in walking, not elsewhere classified  Rationale for Evaluation and Treatment Rehabilitation  PERTINENT HISTORY: Paul Hall is a 26yoM who presents with severe weakness, quadriparesis, altered sensorium, and visual impairment s/p critical illness and prolonged hospitalization. Pt hospitalized in October 2020 with ARDS 2/2 COVID19 infection. Pt sustained a complex and lengthy hospitalization which included tracheostomy, prolonged sedation, ECMO. In this period pt sustained CVA and SDH. Pt has now been liberated from tracheostomy and G-tube. Pt has since been hospitalized for wound infection and UTI. Pt lives with parents at home, has hospital bed and left chair, hoyer lift transfers, and power WC for mobility needs. Pt needs heavy physical assistance with ADL 2/2 BUE contractures and motor dysfunction   PRECAUTIONS: Fall  SUBJECTIVE:  Patient reports feeling okay today - mother reports she has not been able to stretch him.      PAIN:  Are you having pain? No   TODAY'S TREATMENT:  - 12/14/22   *patient dependently transferred from power w/c to mat table via Michiel Sites +2 assist (mother assist PT)   *Deferred standing today due to PT discretion (PT  having some back pain today and will resume standing next session)   Therex:    Passive LE stretching - low back/gluteal, hip rotators, IT band, hamstring, gastroc/achilles and low back rotation x each LE    PATIENT EDUCATION: Education details: Progression of intensity at home with trunk strength exercises and how to grade intensity using a goniometer bubble app.   HOME EXERCISE PROGRAM:  Access Code: [email or web address] URL: [email or web address] Date: 03/23/2022 Prepared by: Paul Hall  Exercises - Supine Bridge  - 3 x weekly - 3 sets -  10 reps - 2 hold - Supine Gluteal Sets  - 3 x weekly - 3 sets - 10 reps - 5 sec hold - Supine Quad Set  - 1 x daily - 3 x weekly - 3 sets - 10 reps - 5 hold - Seated Long Arc Quad  - 1 x daily - 7 x weekly - 3 sets - 10 reps - Seated Hip Adduction Squeeze with Ball  - 1 x daily - 3 x weekly - 3 sets - 10 reps - 5 hold - Seated Hip Abduction  - 1 x daily - 3 x weekly - 3 sets - 10 reps - 2 hold    PT Short Term Goals -       PT SHORT TERM GOAL #1   Title Pt will be independent with HEP in order to improve strength and balance in order to decrease fall risk and improve function at home and work.    Baseline 01/04/2021= No formal HEP in place; 12/12 no HEP in place; 05/10/2021-Patient and his father were able to report compliance with curent HEP consisting of mostly seated/reclined LE strengthening. Both verbalize no questions at this time.    Time 6    Period Weeks    Status Achieved    Target Date 02/15/21            PT LONG TERM GOAL #1     Title Patient will increase BLE gross strength by 1/2 muscle grade to improve functional strength for improved independence with potential gait, increased standing tolerance and increased ADL ability.     Baseline 01/04/2021- Patient presents with 1/5 to 3-/5 B LE strength with MMT; 12/12: goal partially met for Left knee/hip; 05/10/2021= 2-/5 bilateal Hip flex; 3+/5 bilateral Knee ext; 06/21/2021= Patient presents with 2-/5 bilateral Hip flex; 3+/5 bilateral knee ext/flex; 2-/5 left ankle DF; 0/5 right ankle- and able to increase reps and resistance with LE's. 09/15/2021- Patient technically presents with 2-/5 B hip flex/abd/add - but he is able to raise his hip up to approx 100 deg which has improved. 3+/5 Bilateral knee ext, 2-/5 left ankle and 0/5 right ankle.  12/08/2021= Patient able to lift left knee at 110 deg of hip flex; presents with 3+/5 knee ext, 2-/5 left ankle DF and 0/5 right ankle DF, 2-/5 bilateral Hip abd in seated position.    12/6: R: knee  3+/5 ext, 2/5 flexion, left knee 3+/5 extension, 3+/5 flexion, R hip: 2+/5 hip add, 2+/5 hip ABD L hip: 4-/5 hip ABD, 3+/5 hip ADD, 3+/5 hip flexion; 06/06/2022= Patient now presents with 2-/5 right ankle DF/PF;     Time 12     Period Weeks     Status MET    Target Date 03/07/2022         PT LONG TERM GOAL #2    Title Patient will tolerate sitting unsupported demonstrating erect sitting posture for 15 minutes with CGA to demonstrate improved  back extensor strength and improved sitting tolerance.     Baseline 01/04/2021- Patient confied to sitting in lift chair or electric power chair with back support and unable to sit upright without physical assistance; 12/12: tolerates <1 minutes upright unsupported sitting. 05/10/2021=static sit with forward trunk lean  in his power wheelchair without back support x approx 3 min. 06/21/2021=Unable to assess today due to patient with acute back pain but on previous visit able to sit x 8 min without back support. 09/15/2021- on last visit- 09/13/2021- patient was able to sit unsupported x 8 min at edge of mat. 10/13/2023 - Patient was able to sit at edge of mat with varying level of assist today from SBA to min A for a total of 20 min. 12/13/2021= Patient demonstrated unsupported sitting at edge of mat for approx 20 min    Time 12     Period Weeks     Status GOAL MET    Target Date 12/08/21          PT LONG TERM GOAL #3    Title Patient will demonstrate ability to perform static standing in // bars > 2 min with Max Assist  without loss of balance and fair posture for improved overall strength for pre-gait and transfer activities.     Baseline 01/04/2021= Patient current uanble to stand- Dependent on hoyer or sit to stand lift for transfers. 05/10/2021=Not appropriate yet- Currently still dependent with all transfers using hoyer. 06/21/2021= Patient continuing now to focus on LE strengthening to prepare for standing-unable to try today due to acute low back pain-  planning on  attempting in new cert period. 09/15/2021- Patient has attempted standing 2x in past two week- max Assist of 2 people - only once was he successful to clearing his bottom from chair - Will continue to be a focus during the new certification. 12/13/2021= Patient has been limited secondary to increased overall low back pain during this certification and will require more time to focus on this goal.  12/6: not assessed this date, will assess at date when 2-3 PTs are present for assistance     Time 12     Period Weeks     Status GOAL not appropriate at this time - may attempt in future once Patient presents with improved overall LE strength.                 PT LONG TERM GOAL #4    Title Pt will improve FOTO score by 10 points or more demonstrating improved perceived functional ability     Baseline FOTO 7 on 10/17; 03/15/21: FOTO 12; 05/10/2021 06/21/2021= 1; 09/15/2021= 9; 12/13/2021= Will issue next visit 12/6: 4; 06/06/2022= Will assess next visit; Goal not appropriate as patient is not ambulatory.     Time 12     Period Weeks     Status WITHDRAWN    Target date 11/30/2022         PT LONG TERM GOAL #5    Title Patient will perform sit to stand transfer with appropriate AD and max assist of 2 people with 75% consistency to prepare for pregait activities.     Baseline 09/15/2021= Patient unable to stand well- unable to clear his bottom off chair with Max assist of 2 persons. 12/13/2021- Goal not appropriate to try yet but will keep and roll over to next cert as shift continues to focus on transfers/standing; 4/24= Patient able to perform active ankle DF/PF with right LE and able  to raise his knee into seated march and clear floor without physical assist today - as previously unable as well as lift right knee ext to near full ROM to improve strength for eventual standing. 09/07/2022- Goal continues to not be appropriate but patient is now standing some and will keep goal active; 11/23/2022= patient is now performing  sit to stand with max A +2 and now able to clear his bottom off mat. 11/30/2022- Patient continues to perform sit to stand transfers with Max A+2 and able to clear bottom well off mat but not erect yet-     Time 12     Period Weeks     Status PROGRESSING    Target date  02/22/2023       PT LONG TERM GOAL #6  Title Patient will tolerate sitting unsupported demonstrating erect sitting posture for 30 minutes with CGA to demonstrate improved back extensor strength and improved sitting tolerance.   Baseline 12/13/2021= Patient demonstrated unsupported sitting at edge of mat for approx 20 min;  12/29/2021- Patient performed approx 30 min of dynamic sitting activities today. 06/06/2022= Patient demonstrated ability to sit and perform static and dynamic UE/LE movement with only Supervision.   Time 12   Period Weeks   Status GOAL MET           7.  Patient will tolerate 2 minutes or more of standing in sit to stand lift or with max assist +2 in order to indicate improved lower extremity weightbearing tolerance for progression to standing in parallel bars. Baseline: 1 minute on most recent stand 02/21/22; 06/06/2022- Patient did attempt today after complaining of right LE pain last week. He attempted 3 stands using sit to stand lift. - all over 1 min- last one approx 48 sec- stopped due to fatigue. More erect standing in lift today - still poor gluteal strength but able to activate glutes and extend hips upon command but unable to hold > 5 sec. 07/28/2022- Will attempt standing next visit but patient has been able to stand since last progress note for up to 3 min at a time at his best. 09/07/2022-  attempted standing with max +2 and patient able to stand 15-20 sec so will now  revise goal; 11/23/2022- Patient at his best between last 2 visits was able to stand approx 50 sec with max A+2.  11/30/2022- Patient performed 4 trials of static standing - max A +2- clearing bottom and using forearms to bear weight  Goal  status: Revised Target date: 02/22/2023   8. Patient will tolerate sitting unsupported demonstrating ability to perform dynamic UE/LE activities for 30 minutes  independently to demonstrate ability to sit at edge of bed to eat or perform some ADL's/exercise for optimal quality of life.             Baseline: 06/06/2022- Patient able to sit and perform some UE/LE exercises but requires CGA at times for safety- he is able to static sit for 30 min with supervision. 07/27/2022= Patient able to now sit > 30 min with Supervision only - performing static and dynamic activities. Will keep goal active to ensure patient is consistent. 09/07/2022=  Patient able to demonstrate consistent ability to sit at edge of mat during visits             Goal Status: MET             Target date: 08/29/2022        Plan  Clinical Impression Statement  Patient was tight today with all stretching in supine- He did warm up and allow PT to stretch hip adductors/hamstrings that were there tightest today. He responded stating at end of session that "That was a good stretch and I probably needed that." Continued to encourage stretching at home for improved flexibility.  Pt will continue to benefit from skilled physical therapy intervention to address impairments, improve QOL, and attain therapy goals.    Personal Factors and Comorbidities Comorbidity 3+;Time since onset of injury/illness/exacerbation    Comorbidities CVA, diabetes, Seizures    Examination-Activity Limitations Bathing;Bed Mobility;Bend;Caring for Others;Carry;Dressing;Hygiene/Grooming;Lift;Locomotion Level;Reach Overhead;Self Feeding;Sit;Squat;Stairs;Stand;Transfers;Toileting    Examination-Participation Restrictions Cleaning;Community Activity;Driving;Laundry;Medication Management;Meal Prep;Occupation;Personal Finances;Shop;Yard Work;Volunteer    Stability/Clinical Decision Making Evolving/Moderate complexity    Rehab Potential Fair    PT Frequency 2x / week    PT  Duration 12 weeks    PT Treatment/Interventions ADLs/Self Care Home Management;Cryotherapy;Electrical Stimulation;Moist Heat;Ultrasound;DME Instruction;Gait training;Stair training;Functional mobility training;Therapeutic exercise;Balance training;Patient/family education;Orthotic Fit/Training;Neuromuscular re-education;Wheelchair mobility training;Manual techniques;Passive range of motion;Dry needling;Energy conservation;Taping;Visual/perceptual remediation/compensation;Joint Manipulations    PT Next Visit Plan core strength/motor control in short-sitting, sit to stand if able; Continue with progressive LE Strengthening. Stand activities as appropriate.     PT Home Exercise Plan No changes to HEP today    Consulted and Agree with Plan of Care Patient;Family member/caregiver    Family Member Consulted            5:39 PM, 12/14/22 Louis Meckel, PT Physical Therapist - [facility]  Outpatient Physical Therapy- [facility] [phone]      12/14/2022, 5:39 PM

## 2022-12-14 NOTE — Therapy (Addendum)
Occupational Therapy Neuro Treatment Note  Patient Name: Paul Hall MRN: [id number] DOB:February 16, 1996, 27 y.o., male Today's Date: 12/14/2022  PCP: Dr. Sherwood Gambler REFERRING PROVIDER: Dr. Sherwood Gambler   OT End of Session - 12/14/22 1726     Visit Number [id number]    Number of Visits 84    Date for OT Re-Evaluation 02/08/23    OT Start Time 1615    OT Stop Time 1700    OT Time Calculation (min) 45 min    Equipment Utilized During Treatment tilt in space power wc    Activity Tolerance Patient tolerated treatment well    Behavior During Therapy WFL for tasks assessed/performed                 Past Medical History:  Diagnosis Date   Diabetes mellitus (HCC)    Hypertension    Stroke [facility])    Past Surgical History:  Procedure Laterality Date   IVC FILTER PLACEMENT ([facility] HX)     LEG SURGERY     PEG TUBE PLACEMENT     TRACHEOSTOMY     Patient Active Problem List   Diagnosis Date Noted   Sepsis due to vancomycin resistant Enterococcus species (HCC) 06/06/2019   SIRS (systemic inflammatory response syndrome) (HCC) 06/05/2019   Acute lower UTI 06/05/2019   VRE (vancomycin-resistant Enterococci) infection 06/05/2019   Anemia 06/05/2019   Skin ulcer of sacrum with necrosis of muscle (HCC)    Urinary retention    Type 2 diabetes mellitus without complication, with long-term current use of insulin (HCC)    Tachycardia    Lower extremity edema    Acute metabolic encephalopathy    Obstructive sleep apnea    Morbid obesity with BMI of 60.0-69.9, adult (HCC)    Goals of care, counseling/discussion    Palliative care encounter    Sepsis (HCC) 04/27/2019   H/O insulin dependent diabetes mellitus 04/27/2019   History of CVA with residual deficit 04/27/2019   Seizure disorder (HCC) 04/27/2019   Decubitus ulcer of sacral region, stage 4 (HCC) 04/27/2019   ONSET DATE: 01/2019  REFERRING DIAG: CVA/COVID-19  THERAPY DIAG:  Muscle weakness (generalized)  Other lack of  coordination  Rationale for Evaluation and Treatment Rehabilitation  SUBJECTIVE:   SUBJECTIVE STATEMENT:  Pt. reports feeling better today.  Pt accompanied by: self and family member  PERTINENT HISTORY:  Pt. is a 27 y.o. male who was diagnosed with COVID-19, and CVA with resultant quadriplegia. Pt. Was then hospitalized with VRE UTI. PMHx includes: urinary retention, seizure disorder, obstructive sleep disorder, DM Type II, Morbid obesity.   PRECAUTIONS: None  WEIGHT BEARING RESTRICTIONS No  PAIN:  Are you having pain? Discomfort/tightness with reps of ROM in the right elbow, and in the right digits.   FALLS: Has patient fallen in last 6 months? No  LIVING ENVIRONMENT: Lives with: lives with their family Lives in: House/apartment Stairs: No Level Entry Has following equipment at home: Wheelchair (power) and hoyer lift, sit to stand lift  PLOF: Independent  PATIENT GOALS: To be able to engage in more daily care tasks.  OBJECTIVE:   HAND DOMINANCE: Right  ADLs:  Transfers/ambulation related to ADLs: Eating: Pt. reports being able to hold standard utensils, and is starting to engage more in self-feeding tasks, hand to mouth patterns. Pt. Reports that he does as much as he can with the task, and family assists  with the remainder of the task. Grooming: Pt. Is able to initiate holding an electric toothbrush, and brush  his teeth. Family assists LB Dressing: Total Assist UE dressing: Pt. is now able to reach up to actively assist with grasping , and pulling his gown down. Toileting:  Total Assist Bathing: MaxA UB, Total assist LB Tub Shower transfers: N/A Equipment: See above    IADLs: Shopping: Relies on family to assist Light housekeeping: Total Assist Meal Prep: Total Assist Community mobility:   Medication management:  Total Assist  Financial management: N/A Handwriting: Not legible: Pt. Is able to hold a pen with the left hand, and initiate marking the page.  Pt.'s eye glasses were not available  MOBILITY STATUS:  Power w/c  POSTURE COMMENTS:  Pt. Requires position changes in his power w/c  ACTIVITY TOLERANCE: Activity tolerance:  Fair  FUNCTIONAL OUTCOME MEASURES: FOTO: 40  TR score: 45  05/25/2022:   FOTO: 50 TR score: 45  07/20/22: FOTO 55  UPPER EXTREMITY ROM     Active ROM Right eval Right 03/21/2022 Right 05/25/2022 R  07/20/22 Right  07/25/22 Right 08/15/2022 Right 09/26/2022 Right Left eval Left  03/21/2022 Left 05/25/2022 L  07/20/22 Left 07/25/2022 Left  08/15/2022 Left  09/26/2022 Left  11/16/2022  Shoulder flexion 106 scaption 105  Scaption 62 flexion 110 scaption 100  105 105 105 119 123 125 130  136 138 130  Shoulder abduction 114 90 97 100  105 117 120 110 115 115 115  118 121 125  Shoulder adduction                  Shoulder extension                  Shoulder internal rotation                  Shoulder external rotation                  Elbow flexion 120(130) 145 145 140  144 140 142 135(135) 145 145 145  145    Elbow extension -45(-35) -22(-35) -28(-22)  -32(-21) -32(-28) -28(-26) -28(-28) -27(-18) -21(-20) -20(-16)  -25(-10) -23(-10) -21(-10) -20(-18)  Wrist flexion                  Wrist extension -30(10) -25(10) -10(30)  -10(20) -10(25) -20(40) -30(10) 10(50) 19(50) 28(50)  30(50) 35(55) 36(55) 36(60)  Wrist ulnar deviation                  Wrist radial deviation                  Wrist pronation                  Wrist supination   Limited by flexor tone        Limited by flexor tone       (Blank rows = not tested)  UPPER EXTREMITY MMT:     Right eval Right 03/21/2022 Right  05/25/2022 R 07/20/22 Right 08/25/2022 Right 09/26/2022 Right 11/16/2022 Left eval Left 03/21/2022 Left  05/25/2022 L 07/20/22 Left  08/15/2022 Left 09/26/2022 Left 11/16/2022  Shoulder flexion 3-/5 scaption 3-/5 3-/5 3+/5 3+/5 3+/5 3/5 3-/5 3/5 3+/5 4+/5 4+/5 4+/5 4+/5  Shoulder abduction 3-/5 3-/5 3-/5 4/5 4/5 4/5 4/5 3-/5 3-/5 4-/5  4+/5 4+/5 4+5 4+/5  Shoulder adduction                Shoulder extension                Shoulder internal rotation  Shoulder external rotation                Elbow flexion 3/5 3/5 3+/5 4/5 4+/5 4+/5 4+/5 3/5 3+/5 4-/5 4+/5 5/5 5/5 5/5  Elbow extension 2-/5 2/5 3-/5 4+/5 4+/5 4+/5 4+/5 2-/5 2/5  4+/5 4+/5 4+/5   Wrist flexion                Wrist extension 2-/5 2-/5 2/5 2/5 2/5 2/5 2/5 2/5 2+/5 3-/5  3-/5 3-/5 3-/5  Wrist ulnar deviation                Wrist radial deviation                Wrist pronation                Wrist supination                (Blank rows = not tested)   HAND FUNCTION:  Grip strength: Right: 0 lbs; Left: 0 lbs and Lateral pinch: Right: 5 lbs, Left: 2 lbs  03/21/2022: Lateral pinch: Right: 3.5 lbs, Left: 2 lbs  07/20/22: Grip strength: Right: 3 lbs; Left: 4 lbs and Lateral pinch: Right: 5 lbs, Left: 3 lbs  08/15/2022:  Grip strength: Right: 0 lbs; Left: 5 lbs and Lateral pinch: Right: 2 lbs, Left: 4 lbs  09/26/2022  Grip strength: Right: 0 lbs; Left: 8 lbs and Lateral pinch: Right: 2 lbs, Left: 5 lbs  11/16/2022  Grip strength: Right: 0 lbs; Left: 8 lbs   Bilateral digit PIP/DIP flexion contractures with MP hyperextension with attempts for AROM. Pt. is able to tolerate AROM to the bilateral digits at the initial evaluation however, has a history of pain in the digits.  COORDINATION: Eval: Pt. is unable to grasp 9-hole test pegs. Pt. is able to initiate grasping larger pegs, and is able to hold a pen in the left hand.  07/20/22: 2 min 36 seconds to remove 9 pegs from 9 hole peg test - cues to locate pegs 2/2 low vision. Pt. is able to initiate grasping larger pegs on R hand and is able to hold a pen in the left hand.  08/15/2022:      Vision, and sensation limiting accuracy of 9 hole peg test results. Pt. Was able to grasp and remove vertical pegs intermittently with cues.    SENSATION: Light touch: Impaired   EDEMA:  N/A  MUSCLE  TONE: BUE flexor Spasticity  COGNITION Overall cognitive status: Continue to assess in functional context  VISION:   Subjective report: Pt. was not wearing glasses at the time of the initial eval.  Baseline vision: Vision is very limited. Wears glasses all the time Visual history: History of impaired vision following CVA. Pt. Has received treatment through the [facility] low vision rehabilitation program.   VISION ASSESSMENT: Impaired To be further assessed in functional context  PERCEPTION: Impaired   PRAXIS: Impaired: motor planning  OBSERVATIONS:  Pt reports being on Tramadol   TODAY'S TREATMENT   Therapeutic Ex.:  Pt. Performed AROM for right shoulder flexion, abduction. Emphasis was placed on slow prolonged gentle passive stretching for RUE, forearm supination, wrist extension, wrist extension, digit MP, PIP, and DIP extension following moist heat. ROM was performed to decrease stiffness, and prepare the RUE for functional use. ROM was performed following 5 min. for moist heat to the shoulder right elbow, forearm, wrist, and digits. PROM for Left digit MP, PIP, and DIP extension.  Manual Therapy:  Pt. tolerated soft tissue massage to the right scapular musculature, digit lateral, and sagittal bands 2/2 stiffness, and discomfort in preparation and independent of ROM/ther. Ex.      PATIENT EDUCATION: Education details: Doctor, general practice and fork positioning for self feeding Person educated: Patient and Parent Education method: Explanation, Demonstration, Tactile cues, and Verbal cues Education comprehension: verbalized understanding, returned demonstration, verbal cues required, and needs further education  HOME EXERCISE PROGRAM:  Continue ongoing assessment, and continue to provide as needed.   GOALS: Goals reviewed with patient? Yes  SHORT TERM GOALS: Target date: 11/07/2022   To assess splint fit, and make appropriate adjustments to promote good skin integrity through the  palmar surface of the bilateral hands.  Baseline: 05/25/22: Goal currently met, however ongoing as needs to assess splint fit arise. 03/23/2022: Pt. is wearing splints a couple of hours at night bilateral resting hand splints. 03/21/2022: Pt. is wearing splints a couple of hours at night bilateral resting hand splints. Goal status: Deferred   LONG TERM GOALS: Target date: 02/08/2023    FOTO score will Improve by 2 points for Pt. perceived improvement with the assessment specific ADL/IADL tasks.  Baseline: 11/16/2022: 52 09/27/2022: 51 07/20/2022: FOTO 55 05/25/2022: FOTO score: 50,  TR score: 45 Eval: FOTO score: 40,  TR score: 45 Goal status: Achieved   2.   Pt. will independently perform oral care for 100% of the task after complete set-up. Baseline: 11/16/2022: Pt. Completes 90% of the task with complete set-up. 09/26/2022: Completes 90% of the task, proximal assist at times required at the elbow.  08/15/2022: Completes 90% of the task, proximal assist at times required at the elbow. 07/20/22: completes 90% of task, limited by shoulder flexion. 05/25/2022:  Pt. Is able to initiate and perform oral care for approximately 90% of the task. Complete set-up required. Assi needed only for the very back teeth. 03/23/2022: Pt. Is able to initiate and perform oral care for approximately 75% of the task. Pt. Requires assist at proximally at the elbow for through oral care. 03/21/2022: Pt. Is able to initiate and perform oral care for approximately 75% of the task. Pt. Requires assist at proximally at the elbow for through oral care. Eval: Pt. is able to initiate using an electric toothbrush. Pt. requires assist for set-up, and assist for thoroughness, and as he Pt. fatigues. Goal status: Ongoing  3.  Pt. Will be modified independence with self-feeding for 75% using a swivel spoon, and universal fork after complete set-up Baseline: 11/16/2022: 100% with finger foods, Pt. Is initiating with a swivel spoon, and universal  cuff for fork use, however requires assist for consistency, and accuracy.  09/26/2022: 90% using the spoon, assist at time with the forearm motion, and grip on the utensils. 100% for finger foods.  08/15/2022:  90% using the spoon, assit at time with the forearm motion, and grip on the utensils. 100% for finger foods-independent with set-up- unsupervised.  07/20/22: self-feeds cereal using spoon 90% of task. 05/25/2022: Pt. Is able to use a spoon to scoop cereal when feeding himself cereal 85% of the time. Pt. Is able to feed himself snack/finger foods 100% of the time. Pt. Continues to work on consistency of  stabilizing a cup/mug when drinking. Pt. Is able to grasp a water bottle with assist initially, with assist tapering off as he drinks.03/23/2022: Pt. Is able to perform scooping cereal for 75% of the time. Pt. required assist, and support at the left elbow, and Pt. Presents with limited  forearm supination when using the spoon, and bringing it towards his mouth. Pt. Is able to use a fork to spear items, and perform the hand to mouth pattern.  03/21/2022: Pt. Is able to perform scooping cereal for 75% of the time. Pt. required assist, and support at the left elbow, and Pt. presents with limited forearm supination when using the spoon, and bringing it towards his mouth. Pt. Is able to use a fork to spear items, and perform the hand to mouth pattern.  Eval: Pt. is able to hold standard standard utensils. Pt. Performs as much of the task as he, can and has assistance for the remainder. Goal status:  Revised  4.  Pt. Will improve grasp patterns and consistently grasp 1/4" objects for ADL, and IADL tasks.  Baseline: 11/16/2022: Continues to grasp 1/4" pegs with min a + visual cues, consistently grasping 1/2" objects with visual cues 09/26/2022: Continues to grasp 1/4" pegs with min a + visual cues, consistently grasping 1/2" objects with visual cues. 08/15/2022: grasps 1/4" pegs with min a + visual cues, consistently  grasping 1/2" objects with visual cues. 07/20/22: grasps 1/4" pegs with min a + visual cues, consistently grasping 1/2" objects with visual cues. 05/25/2022: Pt. Is working on improving consistency of grasping 1/2" objects with visual cues.  03/23/2022: Pt. Is able to grasp 1" objects consistently,and continues to work on the hand patterns needed to grasp 1/2" objects.03/21/2022: Pt. Is able to grasp 1" objects consistently,and continues to work on the hand patterns needed to grasp 1/2" objects. Eval: Pt. is able to grasp 1" objects intermittently using a lateral grasp pattern. Goal status: Progressing  5.  Pt. will independently write his name legibly with letter sizes under 1". Baseline: 11/16/2022: Pt. Continues to be able to write name with smaller letter size. Pt. Is signing name with a computer styus. Pt. Requires visual cues, and assist 09/26/2022: Pt. Continues to be able to write name with smaller letter size. Pt. Is signing name with a computer styus. Pt. Requires visual cues, and assist. 08/15/2022: Pt. Is able to write name with smaller letter size. Pt. Is signing name with a computer styus. Pt. Requires visual cues, and assist. 07/20/22: stabilizing assist to write name with 75% legibility with 2" letters. 05/25/2022: Pt. to continue to work towards formulating grasp patterns in preparation for grasping a large width pen. Pt. Requires visual cues. 03/23/2022: Pt. Is able to write his name with modA, however has difficulty with formulating letter sizes less than 2" in size with 50% legibility for the 3 letters of his name.03/21/2022: Pt. Is able to write his name with modA, however has difficulty with formulating letter sizes less than 2" in size with 50% legibility for the 3 letters of his name. Eval: Pt is able to hold a thin marker with his left hand, and formulate a line, and initiate a circular pattern (Pt. without glasses today) Goal status: Progressing  6. Pt. Will reach up to comb/brush his hair  with minA.  Baseline: 11/16/2022: Pt. Is able to use a hair pick for the left side of his head using his left hand. Pt. Presents with difficulty reaching to the right side of his head.09/26/2022: Pt. Is able to use a hair pick for the left side of his head using his left hand. Pt. Presents with difficulty reaching to the right side of his head. 5/13/202: 75% using a hair pick, assist to the right side of the head. 07/20/22: reaches 75% of head,  assist for far R side of head, fatigues quickly. 05/25/2022: Pt. Is able to reach up with the left hand to the left side, top, and back of his head. 03/23/2022: Pt. is now able to more consistently initiate reaching up to his head with his left hand in preparation for haircare12/18/2023: Pt. is now able to more consistently initiate reaching up to his head with his left hand in preparation for haircare. Eval: Pt. is able to initiate reaching up for hair care with a long handled brush, however is unable to sustain UE's in elevation to perform the task.     Goal status: Ongoing  7. Pt. Will independently navigate the w/c through his environment with minA with visual scanning, and hand placement on the controls.  Baseline: 11/16/2022: Deferred 2/2 vision 09/26/2022: Pt. Is now navigating his w/c ouside the home, and around his block. 08/15/2022: Pt. Requires minA to setup hand on controls and MIN cues to navigate the w/c in wide spaces, requires MIN - MOD cues to navigate the w/c through more narrow doorways, and tighter turns - varies based on good vs bad vision days.07/20/22: Pt. Requires minA to setup hand on controls and MIN cues to navigate the w/c in wide spaces, requires MIN - MOD cues to navigate the w/c through more narrow doorways, and tighter turns - varies based on good vs bad vision days. 05/25/2022: Pt. Requires minA  and  cues to navigate the w/c in wide spaces, and requires Mod cues to navigate the w/c through more narrow doorways, and tighter turns. Pt. Requires max  cues for scanning through the environment, and moderate cues for hand placement on the controls.     Goal status: Deferred 2/2 vision    8. Pt. Will improve bilateral grip strength to be able to independently grasp, and pull up blankets, and linens.   Baseline: 11/16/2022: R: 0 L: 8# Pt. Is more consistently grasping his blankets and attempting to pull them up. 09/26/2022: R: 0  L: 8# Pt. is attempting to grasp, and pull blankets up more, using mostly the left hand. 08/15/2022: Pt. has difficulty securely holding, and pulling up blankets, and linens.    Goal status: Ongoing  9. Pt. will consistently actively control the releasing  of blankets, covers, and linens from his hands once they are in the desired position over him. Baseline: 11/16/2022: Pt. continues to improve with actively releasing blankets form his hands. 09/26/2022: Pt. is improving with actively releasing blankets form his hands. 08/15/2022: Pt. has difficulty with controlled releasing of blankets/linens from his grasp.     Goal status: Ongoing    10. Pt. Will improve bilateral UE strength by 2 MM grades to assist with ADLs, and IADLs  Baseline: 11/16/2022: Right: shoulder flexion: 3/5, abduction: 4/5, elbow flexion: 4+/5 , extension: 4+/5 wrist extension: 2/5; Left: shoulder flexion: 4+/5, abduction: 4+/5, elbow flexion: 5/5 , extension:  wrist extension: 3-/5 Right: shoulder flexion: 3+/5, abduction: 4/5, elbow flexion: 4+/5 , extension: 4+/5 wrist extension: 2/5; Left: shoulder flexion: 4+/5, abduction: 4+/5, elbow flexion: 5/5 , extension: 4+/5 wrist extension: 3-/5  11. Pt. Will improve right wrist extension by 10 degrees to initiate reaching for items in preparation for ADLs. Baseline: 11/16/2022: -30(10)    Goal status: New   ASSESSMENT: CLINICAL IMPRESSION:  Pt. with plans to have Botox injections next week.Pt. had an eye appointment at [facility], and is being referred to the [facility]. Pt. continues to  present with limited ROM,  tightness, in the right 2/2  flexor tone, and tightness in the RUE, forearm, wrist, and digits.  Pt. Continues to present with tightness discomfort in the right 3rd, and 4th digit MPs, and PIPs. Pt. tolerated moist heat, ROM, and stretching well. Pt. continues to work on improving BUE functioning, ROM, strength, motor control, [facility] skills, and visual compensatory strategies to be able to independently pull up, and release blankets/covers, improve writing legibility, improve self-care including oral care, and utensil use during self-feeding tasks, and maximize independence with ADLs, and IADLs.     PERFORMANCE DEFICITS in functional skills including ADLs, IADLs, coordination, dexterity, proprioception, ROM, strength, pain, FMC, GMC, decreased knowledge of use of DME, and UE functional use, cognitive skills including safety awareness, and psychosocial skills including coping strategies, environmental adaptation, habits, and routines and behaviors.   IMPAIRMENTS are limiting patient from ADLs, IADLs, leisure, and social participation.   COMORBIDITIES may have co-morbidities  that affects occupational performance. Patient will benefit from skilled OT to address above impairments and improve overall function.  MODIFICATION OR ASSISTANCE TO COMPLETE EVALUATION: Maximum or significant modification of tasks or assist is necessary to complete an evaluation.  OT OCCUPATIONAL PROFILE AND HISTORY: Comprehensive assessment: Review of records and extensive additional review of physical, cognitive, psychosocial history related to current functional performance.  CLINICAL DECISION MAKING: High - multiple treatment options, significant modification of task necessary  REHAB POTENTIAL: Fair    EVALUATION COMPLEXITY: High    PLAN: OT FREQUENCY: 2 x's a week  OT DURATION:12 weeks  PLANNED INTERVENTIONS: self care/ADL training, therapeutic exercise, therapeutic activity, neuromuscular  re-education, manual therapy, passive range of motion, patient/family education, and cognitive remediation/compensation RECOMMENDED OTHER SERVICES: PT  CONSULTED AND AGREED WITH PLAN OF CARE: Patient and family member/caregiver  PLAN FOR NEXT SESSION: Initiate treatment  Olegario Messier, MS, OTR/L   12/14/22, 5:28 PM

## 2022-12-19 ENCOUNTER — Ambulatory Visit: Payer: BC Managed Care – PPO | Admitting: Occupational Therapy

## 2022-12-19 ENCOUNTER — Ambulatory Visit: Payer: BC Managed Care – PPO

## 2022-12-19 DIAGNOSIS — M6281 Muscle weakness (generalized): Secondary | ICD-10-CM | POA: Diagnosis not present

## 2022-12-19 DIAGNOSIS — R269 Unspecified abnormalities of gait and mobility: Secondary | ICD-10-CM

## 2022-12-19 DIAGNOSIS — I6930 Unspecified sequelae of cerebral infarction: Secondary | ICD-10-CM | POA: Diagnosis not present

## 2022-12-19 DIAGNOSIS — R2681 Unsteadiness on feet: Secondary | ICD-10-CM | POA: Diagnosis not present

## 2022-12-19 DIAGNOSIS — R278 Other lack of coordination: Secondary | ICD-10-CM

## 2022-12-19 DIAGNOSIS — R262 Difficulty in walking, not elsewhere classified: Secondary | ICD-10-CM | POA: Diagnosis not present

## 2022-12-19 NOTE — Therapy (Signed)
OUTPATIENT PHYSICAL THERAPY TREATMENT    Patient Name: Paul Hall MRN: [id number] DOB:1996/01/30, 27 y.o., male Today's Date: 12/20/2022  PCP:  Cindi Carbon PROVIDER:  Lenise Herald, PA-C   PT End of Session - 12/19/22 2257     Visit Number 125    Number of Visits 146    Date for PT Re-Evaluation 02/22/23    Authorization Type BCBS COMM Pro/  Medicaid    Progress Note Due on Visit 130    PT Start Time 1532    PT Stop Time 1613    PT Time Calculation (min) 41 min    Equipment Utilized During Treatment Gait belt    Activity Tolerance Patient tolerated treatment well    Behavior During Therapy WFL for tasks assessed/performed                 Past Medical History:  Diagnosis Date   Diabetes mellitus (HCC)    Hypertension    Stroke (HCC)    Past Surgical History:  Procedure Laterality Date   IVC FILTER PLACEMENT ([facility] HX)     LEG SURGERY     PEG TUBE PLACEMENT     TRACHEOSTOMY     Patient Active Problem List   Diagnosis Date Noted   Sepsis due to vancomycin resistant Enterococcus species (HCC) 06/06/2019   SIRS (systemic inflammatory response syndrome) (HCC) 06/05/2019   Acute lower UTI 06/05/2019   VRE (vancomycin-resistant Enterococci) infection 06/05/2019   Anemia 06/05/2019   Skin ulcer of sacrum with necrosis of muscle (HCC)    Urinary retention    Type 2 diabetes mellitus without complication, with long-term current use of insulin (HCC)    Tachycardia    Lower extremity edema    Acute metabolic encephalopathy    Obstructive sleep apnea    Morbid obesity with BMI of 60.0-69.9, adult (HCC)    Goals of care, counseling/discussion    Palliative care encounter    Sepsis (HCC) 04/27/2019   H/O insulin dependent diabetes mellitus 04/27/2019   History of CVA with residual deficit 04/27/2019   Seizure disorder (HCC) 04/27/2019   Decubitus ulcer of sacral region, stage 4 (HCC) 04/27/2019    REFERRING DIAG: Cerebral infarction, unspecified    THERAPY DIAG:  Muscle weakness (generalized)  Other lack of coordination  History of CVA with residual deficit  Abnormality of gait and mobility  Difficulty in walking, not elsewhere classified  Unsteadiness on feet  Rationale for Evaluation and Treatment Rehabilitation  PERTINENT HISTORY: Paul Hall is a 26yoM who presents with severe weakness, quadriparesis, altered sensorium, and visual impairment s/p critical illness and prolonged hospitalization. Pt hospitalized in October 2020 with ARDS 2/2 COVID19 infection. Pt sustained a complex and lengthy hospitalization which included tracheostomy, prolonged sedation, ECMO. In this period pt sustained CVA and SDH. Pt has now been liberated from tracheostomy and G-tube. Pt has since been hospitalized for wound infection and UTI. Pt lives with parents at home, has hospital bed and left chair, hoyer lift transfers, and power WC for mobility needs. Pt needs heavy physical assistance with ADL 2/2 BUE contractures and motor dysfunction   PRECAUTIONS: Fall  SUBJECTIVE:  Patient reports having a good week so far except having some stomach issues.   PAIN:  Are you having pain? No   TODAY'S TREATMENT:  - 12/19/2022   *patient dependently transferred from power w/c to mat table via Michiel Sites +2 assist (Father- [name] assist PT)    Therex:    Forward lean (PT holding patient wrist  and patient pushing forward then back with some light manual resistance)    Thoracic rotation Left to right and back x 15 reps (Trying to ext right elbow)    Seated hip march 2# AW - Able to lift each LE off floor on his own x 15 reps   Seated Elbow flex with RTB 2 sets of 10 reps on Left UE    Scapular row RTB 2 sets of 10 reps each UE   Elbow ext- trying to touch right arm down to edge of mat table x 10 reps (VC for control)    Seated knee ext 2# AW 2 sets of 10 reps with as full ROM as possible.    Seated hip abd (AROM) lifting foot and sliding out to side  x 10 (very difficult on right LE)    PATIENT EDUCATION: Education details: Progression of intensity at home with trunk strength exercises and how to grade intensity using a goniometer bubble app.   HOME EXERCISE PROGRAM:  Access Code: [email or web address] URL: [email or web address] Date: 03/23/2022 Prepared by: Maureen Ralphs  Exercises - Supine Bridge  - 3 x weekly - 3 sets - 10 reps - 2 hold - Supine Gluteal Sets  - 3 x weekly - 3 sets - 10 reps - 5 sec hold - Supine Quad Set  - 1 x daily - 3 x weekly - 3 sets - 10 reps - 5 hold - Seated Long Arc Quad  - 1 x daily - 7 x weekly - 3 sets - 10 reps - Seated Hip Adduction Squeeze with Ball  - 1 x daily - 3 x weekly - 3 sets - 10 reps - 5 hold - Seated Hip Abduction  - 1 x daily - 3 x weekly - 3 sets - 10 reps - 2 hold    PT Short Term Goals -       PT SHORT TERM GOAL #1   Title Pt will be independent with HEP in order to improve strength and balance in order to decrease fall risk and improve function at home and work.    Baseline 01/04/2021= No formal HEP in place; 12/12 no HEP in place; 05/10/2021-Patient and his father were able to report compliance with curent HEP consisting of mostly seated/reclined LE strengthening. Both verbalize no questions at this time.    Time 6    Period Weeks    Status Achieved    Target Date 02/15/21            PT LONG TERM GOAL #1     Title Patient will increase BLE gross strength by 1/2 muscle grade to improve functional strength for improved independence with potential gait, increased standing tolerance and increased ADL ability.     Baseline 01/04/2021- Patient presents with 1/5 to 3-/5 B LE strength with MMT; 12/12: goal partially met for Left knee/hip; 05/10/2021= 2-/5 bilateal Hip flex; 3+/5 bilateral Knee ext; 06/21/2021= Patient presents with 2-/5 bilateral Hip flex; 3+/5 bilateral knee ext/flex; 2-/5 left ankle DF; 0/5 right ankle- and able to increase reps and resistance with LE's.  09/15/2021- Patient technically presents with 2-/5 B hip flex/abd/add - but he is able to raise his hip up to approx 100 deg which has improved. 3+/5 Bilateral knee ext, 2-/5 left ankle and 0/5 right ankle.  12/08/2021= Patient able to lift left knee at 110 deg of hip flex; presents with 3+/5 knee ext, 2-/5 left ankle DF and 0/5 right ankle DF, 2-/5 bilateral Hip abd in  seated position.    12/6: R: knee 3+/5 ext, 2/5 flexion, left knee 3+/5 extension, 3+/5 flexion, R hip: 2+/5 hip add, 2+/5 hip ABD L hip: 4-/5 hip ABD, 3+/5 hip ADD, 3+/5 hip flexion; 06/06/2022= Patient now presents with 2-/5 right ankle DF/PF;     Time 12     Period Weeks     Status MET    Target Date 03/07/2022         PT LONG TERM GOAL #2    Title Patient will tolerate sitting unsupported demonstrating erect sitting posture for 15 minutes with CGA to demonstrate improved back extensor strength and improved sitting tolerance.     Baseline 01/04/2021- Patient confied to sitting in lift chair or electric power chair with back support and unable to sit upright without physical assistance; 12/12: tolerates <1 minutes upright unsupported sitting. 05/10/2021=static sit with forward trunk lean  in his power wheelchair without back support x approx 3 min. 06/21/2021=Unable to assess today due to patient with acute back pain but on previous visit able to sit x 8 min without back support. 09/15/2021- on last visit- 09/13/2021- patient was able to sit unsupported x 8 min at edge of mat. 10/13/2023 - Patient was able to sit at edge of mat with varying level of assist today from SBA to min A for a total of 20 min. 12/13/2021= Patient demonstrated unsupported sitting at edge of mat for approx 20 min    Time 12     Period Weeks     Status GOAL MET    Target Date 12/08/21          PT LONG TERM GOAL #3    Title Patient will demonstrate ability to perform static standing in // bars > 2 min with Max Assist  without loss of balance and fair posture for improved  overall strength for pre-gait and transfer activities.     Baseline 01/04/2021= Patient current uanble to stand- Dependent on hoyer or sit to stand lift for transfers. 05/10/2021=Not appropriate yet- Currently still dependent with all transfers using hoyer. 06/21/2021= Patient continuing now to focus on LE strengthening to prepare for standing-unable to try today due to acute low back pain-  planning on attempting in new cert period. 09/15/2021- Patient has attempted standing 2x in past two week- max Assist of 2 people - only once was he successful to clearing his bottom from chair - Will continue to be a focus during the new certification. 12/13/2021= Patient has been limited secondary to increased overall low back pain during this certification and will require more time to focus on this goal.  12/6: not assessed this date, will assess at date when 2-3 PTs are present for assistance     Time 12     Period Weeks     Status GOAL not appropriate at this time - may attempt in future once Patient presents with improved overall LE strength.                 PT LONG TERM GOAL #4    Title Pt will improve FOTO score by 10 points or more demonstrating improved perceived functional ability     Baseline FOTO 7 on 10/17; 03/15/21: FOTO 12; 05/10/2021 06/21/2021= 1; 09/15/2021= 9; 12/13/2021= Will issue next visit 12/6: 4; 06/06/2022= Will assess next visit; Goal not appropriate as patient is not ambulatory.     Time 12     Period Weeks     Status WITHDRAWN  Target date 11/30/2022         PT LONG TERM GOAL #5    Title Patient will perform sit to stand transfer with appropriate AD and max assist of 2 people with 75% consistency to prepare for pregait activities.     Baseline 09/15/2021= Patient unable to stand well- unable to clear his bottom off chair with Max assist of 2 persons. 12/13/2021- Goal not appropriate to try yet but will keep and roll over to next cert as shift continues to focus on transfers/standing; 4/24=  Patient able to perform active ankle DF/PF with right LE and able to raise his knee into seated march and clear floor without physical assist today - as previously unable as well as lift right knee ext to near full ROM to improve strength for eventual standing. 09/07/2022- Goal continues to not be appropriate but patient is now standing some and will keep goal active; 11/23/2022= patient is now performing sit to stand with max A +2 and now able to clear his bottom off mat. 11/30/2022- Patient continues to perform sit to stand transfers with Max A+2 and able to clear bottom well off mat but not erect yet-     Time 12     Period Weeks     Status PROGRESSING    Target date  02/22/2023       PT LONG TERM GOAL #6  Title Patient will tolerate sitting unsupported demonstrating erect sitting posture for 30 minutes with CGA to demonstrate improved back extensor strength and improved sitting tolerance.   Baseline 12/13/2021= Patient demonstrated unsupported sitting at edge of mat for approx 20 min;  12/29/2021- Patient performed approx 30 min of dynamic sitting activities today. 06/06/2022= Patient demonstrated ability to sit and perform static and dynamic UE/LE movement with only Supervision.   Time 12   Period Weeks   Status GOAL MET           7.  Patient will tolerate 2 minutes or more of standing in sit to stand lift or with max assist +2 in order to indicate improved lower extremity weightbearing tolerance for progression to standing in parallel bars. Baseline: 1 minute on most recent stand 02/21/22; 06/06/2022- Patient did attempt today after complaining of right LE pain last week. He attempted 3 stands using sit to stand lift. - all over 1 min- last one approx 48 sec- stopped due to fatigue. More erect standing in lift today - still poor gluteal strength but able to activate glutes and extend hips upon command but unable to hold > 5 sec. 07/28/2022- Will attempt standing next visit but patient has been able  to stand since last progress note for up to 3 min at a time at his best. 09/07/2022-  attempted standing with max +2 and patient able to stand 15-20 sec so will now  revise goal; 11/23/2022- Patient at his best between last 2 visits was able to stand approx 50 sec with max A+2.  11/30/2022- Patient performed 4 trials of static standing - max A +2- clearing bottom and using forearms to bear weight  Goal status: Revised Target date: 02/22/2023   8. Patient will tolerate sitting unsupported demonstrating ability to perform dynamic UE/LE activities for 30 minutes  independently to demonstrate ability to sit at edge of bed to eat or perform some ADL's/exercise for optimal quality of life.             Baseline: 06/06/2022- Patient able to sit and perform some UE/LE  exercises but requires CGA at times for safety- he is able to static sit for 30 min with supervision. 07/27/2022= Patient able to now sit > 30 min with Supervision only - performing static and dynamic activities. Will keep goal active to ensure patient is consistent. 09/07/2022=  Patient able to demonstrate consistent ability to sit at edge of mat during visits             Goal Status: MET             Target date: 08/29/2022        Plan     Clinical Impression Statement  Patient presented with good motivation and performed all therex well. Improving Active ROM and improved use of right UE today.  Pt will continue to benefit from skilled physical therapy intervention to address impairments, improve QOL, and attain therapy goals.    Personal Factors and Comorbidities Comorbidity 3+;Time since onset of injury/illness/exacerbation    Comorbidities CVA, diabetes, Seizures    Examination-Activity Limitations Bathing;Bed Mobility;Bend;Caring for Others;Carry;Dressing;Hygiene/Grooming;Lift;Locomotion Level;Reach Overhead;Self Feeding;Sit;Squat;Stairs;Stand;Transfers;Toileting    Examination-Participation Restrictions Cleaning;Community  Activity;Driving;Laundry;Medication Management;Meal Prep;Occupation;Personal Finances;Shop;Yard Work;Volunteer    Stability/Clinical Decision Making Evolving/Moderate complexity    Rehab Potential Fair    PT Frequency 2x / week    PT Duration 12 weeks    PT Treatment/Interventions ADLs/Self Care Home Management;Cryotherapy;Electrical Stimulation;Moist Heat;Ultrasound;DME Instruction;Gait training;Stair training;Functional mobility training;Therapeutic exercise;Balance training;Patient/family education;Orthotic Fit/Training;Neuromuscular re-education;Wheelchair mobility training;Manual techniques;Passive range of motion;Dry needling;Energy conservation;Taping;Visual/perceptual remediation/compensation;Joint Manipulations    PT Next Visit Plan core strength/motor control in short-sitting, sit to stand if able; Continue with progressive LE Strengthening. Stand activities as appropriate.     PT Home Exercise Plan No changes to HEP today    Consulted and Agree with Plan of Care Patient;Family member/caregiver    Family Member Consulted            3:09 PM, 12/20/22 Louis Meckel, PT Physical Therapist - [facility]  Outpatient Physical Therapy- [facility] [phone]      12/20/2022, 3:09 PM

## 2022-12-19 NOTE — Therapy (Signed)
Occupational Therapy Neuro Treatment Note  Patient Name: Paul Hall MRN: [id number] DOB:Mar 29, 1996, 27 y.o., male Today's Date: 12/19/2022  PCP: Dr. Sherwood Gambler REFERRING PROVIDER: Dr. Sherwood Gambler   OT End of Session - 12/19/22 1710     Visit Number [id number]    Number of Visits 84    Date for OT Re-Evaluation 02/08/23    OT Start Time 1618    OT Stop Time 1700    OT Time Calculation (min) 42 min    Equipment Utilized During Treatment tilt in space power wc    Activity Tolerance Patient tolerated treatment well    Behavior During Therapy WFL for tasks assessed/performed                 Past Medical History:  Diagnosis Date   Diabetes mellitus (HCC)    Hypertension    Stroke [facility])    Past Surgical History:  Procedure Laterality Date   IVC FILTER PLACEMENT ([facility] HX)     LEG SURGERY     PEG TUBE PLACEMENT     TRACHEOSTOMY     Patient Active Problem List   Diagnosis Date Noted   Sepsis due to vancomycin resistant Enterococcus species (HCC) 06/06/2019   SIRS (systemic inflammatory response syndrome) (HCC) 06/05/2019   Acute lower UTI 06/05/2019   VRE (vancomycin-resistant Enterococci) infection 06/05/2019   Anemia 06/05/2019   Skin ulcer of sacrum with necrosis of muscle (HCC)    Urinary retention    Type 2 diabetes mellitus without complication, with long-term current use of insulin (HCC)    Tachycardia    Lower extremity edema    Acute metabolic encephalopathy    Obstructive sleep apnea    Morbid obesity with BMI of 60.0-69.9, adult (HCC)    Goals of care, counseling/discussion    Palliative care encounter    Sepsis (HCC) 04/27/2019   H/O insulin dependent diabetes mellitus 04/27/2019   History of CVA with residual deficit 04/27/2019   Seizure disorder (HCC) 04/27/2019   Decubitus ulcer of sacral region, stage 4 (HCC) 04/27/2019   ONSET DATE: 01/2019  REFERRING DIAG: CVA/COVID-19  THERAPY DIAG:  Muscle weakness (generalized)  Rationale for Evaluation and  Treatment Rehabilitation  SUBJECTIVE:   SUBJECTIVE STATEMENT:  Pt. reports feeling better today.  Pt accompanied by: self and family member  PERTINENT HISTORY:  Pt. is a 27 y.o. male who was diagnosed with COVID-19, and CVA with resultant quadriplegia. Pt. Was then hospitalized with VRE UTI. PMHx includes: urinary retention, seizure disorder, obstructive sleep disorder, DM Type II, Morbid obesity.   PRECAUTIONS: None  WEIGHT BEARING RESTRICTIONS No  PAIN:  Are you having pain? Discomfort/tightness with reps of ROM in the right elbow, and in the right digits   FALLS: Has patient fallen in last 6 months? No  LIVING ENVIRONMENT: Lives with: lives with their family Lives in: House/apartment Stairs: No Level Entry Has following equipment at home: Wheelchair (power) and hoyer lift, sit to stand lift  PLOF: Independent  PATIENT GOALS: To be able to engage in more daily care tasks.  OBJECTIVE:   HAND DOMINANCE: Right  ADLs:  Transfers/ambulation related to ADLs: Eating: Pt. reports being able to hold standard utensils, and is starting to engage more in self-feeding tasks, hand to mouth patterns. Pt. Reports that he does as much as he can with the task, and family assists  with the remainder of the task. Grooming: Pt. Is able to initiate holding an electric toothbrush, and brush his teeth. Family assists LB  Dressing: Total Assist UE dressing: Pt. is now able to reach up to actively assist with grasping , and pulling his gown down. Toileting:  Total Assist Bathing: MaxA UB, Total assist LB Tub Shower transfers: N/A Equipment: See above    IADLs: Shopping: Relies on family to assist Light housekeeping: Total Assist Meal Prep: Total Assist Community mobility:   Medication management:  Total Assist  Financial management: N/A Handwriting: Not legible: Pt. Is able to hold a pen with the left hand, and initiate marking the page. Pt.'s eye glasses were not available  MOBILITY  STATUS:  Power w/c  POSTURE COMMENTS:  Pt. Requires position changes in his power w/c  ACTIVITY TOLERANCE: Activity tolerance:  Fair  FUNCTIONAL OUTCOME MEASURES: FOTO: 40  TR score: 45  05/25/2022:   FOTO: 50 TR score: 45  07/20/22: FOTO 55  UPPER EXTREMITY ROM     Active ROM Right eval Right 03/21/2022 Right 05/25/2022 R  07/20/22 Right  07/25/22 Right 08/15/2022 Right 09/26/2022 Right Left eval Left  03/21/2022 Left 05/25/2022 L  07/20/22 Left 07/25/2022 Left  08/15/2022 Left  09/26/2022 Left  11/16/2022  Shoulder flexion 106 scaption 105  Scaption 62 flexion 110 scaption 100  105 105 105 119 123 125 130  136 138 130  Shoulder abduction 114 90 97 100  105 117 120 110 115 115 115  118 121 125  Shoulder adduction                  Shoulder extension                  Shoulder internal rotation                  Shoulder external rotation                  Elbow flexion 120(130) 145 145 140  144 140 142 135(135) 145 145 145  145    Elbow extension -45(-35) -22(-35) -28(-22)  -32(-21) -32(-28) -28(-26) -28(-28) -27(-18) -21(-20) -20(-16)  -25(-10) -23(-10) -21(-10) -20(-18)  Wrist flexion                  Wrist extension -30(10) -25(10) -10(30)  -10(20) -10(25) -20(40) -30(10) 10(50) 19(50) 28(50)  30(50) 35(55) 36(55) 36(60)  Wrist ulnar deviation                  Wrist radial deviation                  Wrist pronation                  Wrist supination   Limited by flexor tone        Limited by flexor tone       (Blank rows = not tested)  UPPER EXTREMITY MMT:     Right eval Right 03/21/2022 Right  05/25/2022 R 07/20/22 Right 08/25/2022 Right 09/26/2022 Right 11/16/2022 Left eval Left 03/21/2022 Left  05/25/2022 L 07/20/22 Left  08/15/2022 Left 09/26/2022 Left 11/16/2022  Shoulder flexion 3-/5 scaption 3-/5 3-/5 3+/5 3+/5 3+/5 3/5 3-/5 3/5 3+/5 4+/5 4+/5 4+/5 4+/5  Shoulder abduction 3-/5 3-/5 3-/5 4/5 4/5 4/5 4/5 3-/5 3-/5 4-/5 4+/5 4+/5 4+5 4+/5  Shoulder adduction                 Shoulder extension                Shoulder internal rotation  Shoulder external rotation                Elbow flexion 3/5 3/5 3+/5 4/5 4+/5 4+/5 4+/5 3/5 3+/5 4-/5 4+/5 5/5 5/5 5/5  Elbow extension 2-/5 2/5 3-/5 4+/5 4+/5 4+/5 4+/5 2-/5 2/5  4+/5 4+/5 4+/5   Wrist flexion                Wrist extension 2-/5 2-/5 2/5 2/5 2/5 2/5 2/5 2/5 2+/5 3-/5  3-/5 3-/5 3-/5  Wrist ulnar deviation                Wrist radial deviation                Wrist pronation                Wrist supination                (Blank rows = not tested)   HAND FUNCTION:  Grip strength: Right: 0 lbs; Left: 0 lbs and Lateral pinch: Right: 5 lbs, Left: 2 lbs  03/21/2022: Lateral pinch: Right: 3.5 lbs, Left: 2 lbs  07/20/22: Grip strength: Right: 3 lbs; Left: 4 lbs and Lateral pinch: Right: 5 lbs, Left: 3 lbs  08/15/2022:  Grip strength: Right: 0 lbs; Left: 5 lbs and Lateral pinch: Right: 2 lbs, Left: 4 lbs  09/26/2022  Grip strength: Right: 0 lbs; Left: 8 lbs and Lateral pinch: Right: 2 lbs, Left: 5 lbs  11/16/2022  Grip strength: Right: 0 lbs; Left: 8 lbs   Bilateral digit PIP/DIP flexion contractures with MP hyperextension with attempts for AROM. Pt. is able to tolerate AROM to the bilateral digits at the initial evaluation however, has a history of pain in the digits.  COORDINATION: Eval: Pt. is unable to grasp 9-hole test pegs. Pt. is able to initiate grasping larger pegs, and is able to hold a pen in the left hand.  07/20/22: 2 min 36 seconds to remove 9 pegs from 9 hole peg test - cues to locate pegs 2/2 low vision. Pt. is able to initiate grasping larger pegs on R hand and is able to hold a pen in the left hand.  08/15/2022:      Vision, and sensation limiting accuracy of 9 hole peg test results. Pt. Was able to grasp and remove vertical pegs intermittently with cues.    SENSATION: Light touch: Impaired   EDEMA:  N/A  MUSCLE TONE: BUE flexor Spasticity  COGNITION Overall  cognitive status: Continue to assess in functional context  VISION:   Subjective report: Pt. was not wearing glasses at the time of the initial eval.  Baseline vision: Vision is very limited. Wears glasses all the time Visual history: History of impaired vision following CVA. Pt. Has received treatment through the [facility] low vision rehabilitation program.   VISION ASSESSMENT: Impaired To be further assessed in functional context  PERCEPTION: Impaired   PRAXIS: Impaired: motor planning  OBSERVATIONS:  Pt reports being on Tramadol   TODAY'S TREATMENT   Therapeutic Ex.:  Pt. Performed AROM for right shoulder flexion, abduction. Emphasis was placed on slow prolonged gentle passive stretching for RUE, forearm supination, wrist extension, wrist extension, digit MP, PIP, and DIP extension following moist heat. ROM was performed to decrease stiffness, and prepare the RUE for functional use. ROM was performed following 5 min. for moist heat to the shoulder right elbow, forearm, wrist, and digits. PROM for Left digit MP, PIP, and DIP extension.  Manual Therapy:  Pt. tolerated soft tissue massage to the right scapular musculature, digit lateral, and sagittal bands 2/2 stiffness, and discomfort in preparation and independent of ROM/ther. Ex.      PATIENT EDUCATION: Education details: Doctor, general practice and fork positioning for self feeding Person educated: Patient and Parent Education method: Explanation, Demonstration, Tactile cues, and Verbal cues Education comprehension: verbalized understanding, returned demonstration, verbal cues required, and needs further education  HOME EXERCISE PROGRAM:  Continue ongoing assessment, and continue to provide as needed.   GOALS: Goals reviewed with patient? Yes  SHORT TERM GOALS: Target date: 11/07/2022   To assess splint fit, and make appropriate adjustments to promote good skin integrity through the palmar surface of the bilateral hands.  Baseline:  05/25/22: Goal currently met, however ongoing as needs to assess splint fit arise. 03/23/2022: Pt. is wearing splints a couple of hours at night bilateral resting hand splints. 03/21/2022: Pt. is wearing splints a couple of hours at night bilateral resting hand splints. Goal status: Deferred   LONG TERM GOALS: Target date: 02/08/2023    FOTO score will Improve by 2 points for Pt. perceived improvement with the assessment specific ADL/IADL tasks.  Baseline: 11/16/2022: 52 09/27/2022: 51 07/20/2022: FOTO 55 05/25/2022: FOTO score: 50,  TR score: 45 Eval: FOTO score: 40,  TR score: 45 Goal status: Achieved   2.   Pt. will independently perform oral care for 100% of the task after complete set-up. Baseline: 11/16/2022: Pt. Completes 90% of the task with complete set-up. 09/26/2022: Completes 90% of the task, proximal assist at times required at the elbow.  08/15/2022: Completes 90% of the task, proximal assist at times required at the elbow. 07/20/22: completes 90% of task, limited by shoulder flexion. 05/25/2022:  Pt. Is able to initiate and perform oral care for approximately 90% of the task. Complete set-up required. Assi needed only for the very back teeth. 03/23/2022: Pt. Is able to initiate and perform oral care for approximately 75% of the task. Pt. Requires assist at proximally at the elbow for through oral care. 03/21/2022: Pt. Is able to initiate and perform oral care for approximately 75% of the task. Pt. Requires assist at proximally at the elbow for through oral care. Eval: Pt. is able to initiate using an electric toothbrush. Pt. requires assist for set-up, and assist for thoroughness, and as he Pt. fatigues. Goal status: Ongoing  3.  Pt. Will be modified independence with self-feeding for 75% using a swivel spoon, and universal fork after complete set-up Baseline: 11/16/2022: 100% with finger foods, Pt. Is initiating with a swivel spoon, and universal cuff for fork use, however requires assist for  consistency, and accuracy.  09/26/2022: 90% using the spoon, assist at time with the forearm motion, and grip on the utensils. 100% for finger foods.  08/15/2022:  90% using the spoon, assit at time with the forearm motion, and grip on the utensils. 100% for finger foods-independent with set-up- unsupervised.  07/20/22: self-feeds cereal using spoon 90% of task. 05/25/2022: Pt. Is able to use a spoon to scoop cereal when feeding himself cereal 85% of the time. Pt. Is able to feed himself snack/finger foods 100% of the time. Pt. Continues to work on consistency of  stabilizing a cup/mug when drinking. Pt. Is able to grasp a water bottle with assist initially, with assist tapering off as he drinks.03/23/2022: Pt. Is able to perform scooping cereal for 75% of the time. Pt. required assist, and support at the left elbow, and Pt. Presents with limited  forearm supination when using the spoon, and bringing it towards his mouth. Pt. Is able to use a fork to spear items, and perform the hand to mouth pattern.  03/21/2022: Pt. Is able to perform scooping cereal for 75% of the time. Pt. required assist, and support at the left elbow, and Pt. presents with limited forearm supination when using the spoon, and bringing it towards his mouth. Pt. Is able to use a fork to spear items, and perform the hand to mouth pattern.  Eval: Pt. is able to hold standard standard utensils. Pt. Performs as much of the task as he, can and has assistance for the remainder. Goal status:  Revised  4.  Pt. Will improve grasp patterns and consistently grasp 1/4" objects for ADL, and IADL tasks.  Baseline: 11/16/2022: Continues to grasp 1/4" pegs with min a + visual cues, consistently grasping 1/2" objects with visual cues 09/26/2022: Continues to grasp 1/4" pegs with min a + visual cues, consistently grasping 1/2" objects with visual cues. 08/15/2022: grasps 1/4" pegs with min a + visual cues, consistently grasping 1/2" objects with visual cues. 07/20/22:  grasps 1/4" pegs with min a + visual cues, consistently grasping 1/2" objects with visual cues. 05/25/2022: Pt. Is working on improving consistency of grasping 1/2" objects with visual cues.  03/23/2022: Pt. Is able to grasp 1" objects consistently,and continues to work on the hand patterns needed to grasp 1/2" objects.03/21/2022: Pt. Is able to grasp 1" objects consistently,and continues to work on the hand patterns needed to grasp 1/2" objects. Eval: Pt. is able to grasp 1" objects intermittently using a lateral grasp pattern. Goal status: Progressing  5.  Pt. will independently write his name legibly with letter sizes under 1". Baseline: 11/16/2022: Pt. Continues to be able to write name with smaller letter size. Pt. Is signing name with a computer styus. Pt. Requires visual cues, and assist 09/26/2022: Pt. Continues to be able to write name with smaller letter size. Pt. Is signing name with a computer styus. Pt. Requires visual cues, and assist. 08/15/2022: Pt. Is able to write name with smaller letter size. Pt. Is signing name with a computer styus. Pt. Requires visual cues, and assist. 07/20/22: stabilizing assist to write name with 75% legibility with 2" letters. 05/25/2022: Pt. to continue to work towards formulating grasp patterns in preparation for grasping a large width pen. Pt. Requires visual cues. 03/23/2022: Pt. Is able to write his name with modA, however has difficulty with formulating letter sizes less than 2" in size with 50% legibility for the 3 letters of his name.03/21/2022: Pt. Is able to write his name with modA, however has difficulty with formulating letter sizes less than 2" in size with 50% legibility for the 3 letters of his name. Eval: Pt is able to hold a thin marker with his left hand, and formulate a line, and initiate a circular pattern (Pt. without glasses today) Goal status: Progressing  6. Pt. Will reach up to comb/brush his hair with minA.  Baseline: 11/16/2022: Pt. Is able to  use a hair pick for the left side of his head using his left hand. Pt. Presents with difficulty reaching to the right side of his head.09/26/2022: Pt. Is able to use a hair pick for the left side of his head using his left hand. Pt. Presents with difficulty reaching to the right side of his head. 5/13/202: 75% using a hair pick, assist to the right side of the head. 07/20/22: reaches 75% of head,  assist for far R side of head, fatigues quickly. 05/25/2022: Pt. Is able to reach up with the left hand to the left side, top, and back of his head. 03/23/2022: Pt. is now able to more consistently initiate reaching up to his head with his left hand in preparation for haircare12/18/2023: Pt. is now able to more consistently initiate reaching up to his head with his left hand in preparation for haircare. Eval: Pt. is able to initiate reaching up for hair care with a long handled brush, however is unable to sustain UE's in elevation to perform the task.     Goal status: Ongoing  7. Pt. Will independently navigate the w/c through his environment with minA with visual scanning, and hand placement on the controls.  Baseline: 11/16/2022: Deferred 2/2 vision 09/26/2022: Pt. Is now navigating his w/c ouside the home, and around his block. 08/15/2022: Pt. Requires minA to setup hand on controls and MIN cues to navigate the w/c in wide spaces, requires MIN - MOD cues to navigate the w/c through more narrow doorways, and tighter turns - varies based on good vs bad vision days.07/20/22: Pt. Requires minA to setup hand on controls and MIN cues to navigate the w/c in wide spaces, requires MIN - MOD cues to navigate the w/c through more narrow doorways, and tighter turns - varies based on good vs bad vision days. 05/25/2022: Pt. Requires minA  and  cues to navigate the w/c in wide spaces, and requires Mod cues to navigate the w/c through more narrow doorways, and tighter turns. Pt. Requires max cues for scanning through the environment, and  moderate cues for hand placement on the controls.     Goal status: Deferred 2/2 vision    8. Pt. Will improve bilateral grip strength to be able to independently grasp, and pull up blankets, and linens.   Baseline: 11/16/2022: R: 0 L: 8# Pt. Is more consistently grasping his blankets and attempting to pull them up. 09/26/2022: R: 0  L: 8# Pt. is attempting to grasp, and pull blankets up more, using mostly the left hand. 08/15/2022: Pt. has difficulty securely holding, and pulling up blankets, and linens.    Goal status: Ongoing  9. Pt. will consistently actively control the releasing  of blankets, covers, and linens from his hands once they are in the desired position over him. Baseline: 11/16/2022: Pt. continues to improve with actively releasing blankets form his hands. 09/26/2022: Pt. is improving with actively releasing blankets form his hands. 08/15/2022: Pt. has difficulty with controlled releasing of blankets/linens from his grasp.     Goal status: Ongoing    10. Pt. Will improve bilateral UE strength by 2 MM grades to assist with ADLs, and IADLs  Baseline: 11/16/2022: Right: shoulder flexion: 3/5, abduction: 4/5, elbow flexion: 4+/5 , extension: 4+/5 wrist extension: 2/5; Left: shoulder flexion: 4+/5, abduction: 4+/5, elbow flexion: 5/5 , extension:  wrist extension: 3-/5 Right: shoulder flexion: 3+/5, abduction: 4/5, elbow flexion: 4+/5 , extension: 4+/5 wrist extension: 2/5; Left: shoulder flexion: 4+/5, abduction: 4+/5, elbow flexion: 5/5 , extension: 4+/5 wrist extension: 3-/5  11. Pt. Will improve right wrist extension by 10 degrees to initiate reaching for items in preparation for ADLs. Baseline: 11/16/2022: -30(10)    Goal status: New   ASSESSMENT: CLINICAL IMPRESSION:  Pt. with plans to have Botox injections tomorrow. Pt. continues to present with limited ROM, tightness, in the right 2/2  flexor tone, and tightness in the RUE, forearm, wrist, and digits. Pt. continues to  present with  tightness discomfort in the right 3rd, and 4th digit MPs, and PIPs. Pt. tolerated moist heat, ROM, and stretching well. Pt. continues to work on improving BUE functioning, ROM, strength, motor control, [facility] skills, and visual compensatory strategies to be able to independently pull up, and release blankets/covers, improve writing legibility, improve self-care including oral care, and utensil use during self-feeding tasks, and maximize independence with ADLs, and IADLs.     PERFORMANCE DEFICITS in functional skills including ADLs, IADLs, coordination, dexterity, proprioception, ROM, strength, pain, FMC, GMC, decreased knowledge of use of DME, and UE functional use, cognitive skills including safety awareness, and psychosocial skills including coping strategies, environmental adaptation, habits, and routines and behaviors.   IMPAIRMENTS are limiting patient from ADLs, IADLs, leisure, and social participation.   COMORBIDITIES may have co-morbidities  that affects occupational performance. Patient will benefit from skilled OT to address above impairments and improve overall function.  MODIFICATION OR ASSISTANCE TO COMPLETE EVALUATION: Maximum or significant modification of tasks or assist is necessary to complete an evaluation.  OT OCCUPATIONAL PROFILE AND HISTORY: Comprehensive assessment: Review of records and extensive additional review of physical, cognitive, psychosocial history related to current functional performance.  CLINICAL DECISION MAKING: High - multiple treatment options, significant modification of task necessary  REHAB POTENTIAL: Fair    EVALUATION COMPLEXITY: High    PLAN: OT FREQUENCY: 2 x's a week  OT DURATION:12 weeks  PLANNED INTERVENTIONS: self care/ADL training, therapeutic exercise, therapeutic activity, neuromuscular re-education, manual therapy, passive range of motion, patient/family education, and cognitive remediation/compensation RECOMMENDED OTHER SERVICES:  PT  CONSULTED AND AGREED WITH PLAN OF CARE: Patient and family member/caregiver  PLAN FOR NEXT SESSION: Initiate treatment  Olegario Messier, MS, OTR/L   12/19/22, 5:13 PM

## 2022-12-20 DIAGNOSIS — M62838 Other muscle spasm: Secondary | ICD-10-CM | POA: Diagnosis not present

## 2022-12-20 DIAGNOSIS — Z8673 Personal history of transient ischemic attack (TIA), and cerebral infarction without residual deficits: Secondary | ICD-10-CM | POA: Diagnosis not present

## 2022-12-21 ENCOUNTER — Ambulatory Visit: Payer: BC Managed Care – PPO | Admitting: Occupational Therapy

## 2022-12-21 ENCOUNTER — Ambulatory Visit: Payer: BC Managed Care – PPO

## 2022-12-21 DIAGNOSIS — R32 Unspecified urinary incontinence: Secondary | ICD-10-CM | POA: Diagnosis not present

## 2022-12-21 DIAGNOSIS — I6389 Other cerebral infarction: Secondary | ICD-10-CM | POA: Diagnosis not present

## 2022-12-21 DIAGNOSIS — L8994 Pressure ulcer of unspecified site, stage 4: Secondary | ICD-10-CM | POA: Diagnosis not present

## 2022-12-23 DIAGNOSIS — G4733 Obstructive sleep apnea (adult) (pediatric): Secondary | ICD-10-CM | POA: Diagnosis not present

## 2022-12-24 DIAGNOSIS — G4733 Obstructive sleep apnea (adult) (pediatric): Secondary | ICD-10-CM | POA: Diagnosis not present

## 2022-12-26 ENCOUNTER — Ambulatory Visit: Payer: Medicare Other | Admitting: Occupational Therapy

## 2022-12-26 ENCOUNTER — Ambulatory Visit: Payer: BC Managed Care – PPO

## 2022-12-26 DIAGNOSIS — R269 Unspecified abnormalities of gait and mobility: Secondary | ICD-10-CM

## 2022-12-26 DIAGNOSIS — R2681 Unsteadiness on feet: Secondary | ICD-10-CM

## 2022-12-26 DIAGNOSIS — R278 Other lack of coordination: Secondary | ICD-10-CM

## 2022-12-26 DIAGNOSIS — R262 Difficulty in walking, not elsewhere classified: Secondary | ICD-10-CM | POA: Diagnosis not present

## 2022-12-26 DIAGNOSIS — M6281 Muscle weakness (generalized): Secondary | ICD-10-CM | POA: Diagnosis not present

## 2022-12-26 DIAGNOSIS — I6930 Unspecified sequelae of cerebral infarction: Secondary | ICD-10-CM

## 2022-12-26 NOTE — Therapy (Signed)
OUTPATIENT PHYSICAL THERAPY TREATMENT    Patient Name: Paul Hall MRN: [id number] DOB:[birth year], 27 y.o., male Today's Date: 12/27/2022  PCP:  Cindi Carbon PROVIDER:  Lenise Herald, PA-C   PT End of Session - 12/26/22 0834     Visit Number 126    Number of Visits 146    Date for PT Re-Evaluation 02/22/23    Authorization Type [employer]BS COMM [employer]/ [place] Medicaid    Progress Note Due on Visit 130    PT Start Time 1535    PT Stop Time 1615    PT Time Calculation (min) 40 min    Equipment Utilized During Treatment Gait belt    Activity Tolerance Patient tolerated treatment well    Behavior During Therapy WFL for tasks assessed/performed                  Past Medical History:  Diagnosis Date   Diabetes mellitus (HCC)    Hypertension    Stroke (HCC)    Past Surgical History:  Procedure Laterality Date   IVC FILTER PLACEMENT ([facility] HX)     LEG SURGERY     PEG TUBE PLACEMENT     TRACHEOSTOMY     Patient Active Problem List   Diagnosis Date Noted   Sepsis due to vancomycin resistant Enterococcus species (HCC) 06/06/2019   SIRS (systemic inflammatory response syndrome) (HCC) 06/05/2019   Acute lower UTI 06/05/2019   VRE (vancomycin-resistant Enterococci) infection 06/05/2019   Anemia 06/05/2019   Skin ulcer of sacrum with necrosis of muscle (HCC)    Urinary retention    Type 2 diabetes mellitus without complication, with long-term current use of insulin (HCC)    Tachycardia    Lower extremity edema    Acute metabolic encephalopathy    Obstructive sleep apnea    Morbid obesity with BMI of 60.0-69.9, adult (HCC)    Goals of care, counseling/discussion    Palliative care encounter    Sepsis (HCC) 04/27/2019   H/O insulin dependent diabetes mellitus 04/27/2019   History of CVA with residual deficit 04/27/2019   Seizure disorder (HCC) 04/27/2019   Decubitus ulcer of sacral region, stage 4 (HCC) 04/27/2019    REFERRING DIAG: Cerebral infarction, unspecified    THERAPY DIAG:  Muscle weakness (generalized)  Other lack of coordination  History of CVA with residual deficit  Abnormality of gait and mobility  Difficulty in walking, not elsewhere classified  Unsteadiness on feet  Rationale for Evaluation and Treatment Rehabilitation  PERTINENT HISTORY: Paul Hall is a 26yoM who presents with severe weakness, quadriparesis, altered sensorium, and visual impairment s/p critical illness and prolonged hospitalization. Pt hospitalized in October 2020 with ARDS 2/2 COVID19 infection. Pt sustained a complex and lengthy hospitalization which included tracheostomy, prolonged sedation, ECMO. In this period pt sustained CVA and SDH. Pt has now been liberated from tracheostomy and G-tube. Pt has since been hospitalized for wound infection and UTI. Pt lives with parents at home, has hospital bed and left chair, hoyer lift transfers, and power WC for mobility needs. Pt needs heavy physical assistance with ADL 2/2 BUE contractures and motor dysfunction   PRECAUTIONS: Fall  SUBJECTIVE:  Patient reports botox injection into right arm went well without any adverse reactions.  PAIN:  Are you having pain? No   TODAY'S TREATMENT:  - 12/26/2022   *patient dependently transferred from power w/c to mat table via Michiel Sites +2 assist (mother- [name]- assist PT)    Therex:    Forward lean (PT holding patient wrist  and patient pushing forward then back with some light manual resistance)      Seated hip march AROM x 15 reps   Seated knee ext 2# AW 2 sets of 10 reps with as full ROM as possible.   Seated hip abd (AROM) lifting foot and sliding out to side x 10   Therapeutic activities:   Static stand x 4 with max A + 2 (PT and patient's mother) - focusing on patient standing as erect as possible and tightening gluteals and quads- Patient was able to stand more erect today- approx 40 sec at rest x 2 but each trial able to clear bottom off mat.    PATIENT  EDUCATION: Education details: Progression of intensity at home with trunk strength exercises and how to grade intensity using a goniometer bubble app.   HOME EXERCISE PROGRAM:  Access Code: [email or web address] URL: [email or web address] Date: 03/23/2022 Prepared by: Maureen Ralphs  Exercises - Supine Bridge  - 3 x weekly - 3 sets - 10 reps - 2 hold - Supine Gluteal Sets  - 3 x weekly - 3 sets - 10 reps - 5 sec hold - Supine Quad Set  - 1 x daily - 3 x weekly - 3 sets - 10 reps - 5 hold - Seated Long Arc Quad  - 1 x daily - 7 x weekly - 3 sets - 10 reps - Seated Hip Adduction Squeeze with Ball  - 1 x daily - 3 x weekly - 3 sets - 10 reps - 5 hold - Seated Hip Abduction  - 1 x daily - 3 x weekly - 3 sets - 10 reps - 2 hold    PT Short Term Goals -       PT SHORT TERM GOAL #1   Title Pt will be independent with HEP in order to improve strength and balance in order to decrease fall risk and improve function at home and work.    Baseline 01/04/2021= No formal HEP in place; 12/12 no HEP in place; 05/10/2021-Patient and his father were able to report compliance with curent HEP consisting of mostly seated/reclined LE strengthening. Both verbalize no questions at this time.    Time 6    Period Weeks    Status Achieved    Target Date 02/15/21            PT LONG TERM GOAL #1     Title Patient will increase BLE gross strength by 1/2 muscle grade to improve functional strength for improved independence with potential gait, increased standing tolerance and increased ADL ability.     Baseline 01/04/2021- Patient presents with 1/5 to 3-/5 B LE strength with MMT; 12/12: goal partially met for Left knee/hip; 05/10/2021= 2-/5 bilateal Hip flex; 3+/5 bilateral Knee ext; 06/21/2021= Patient presents with 2-/5 bilateral Hip flex; 3+/5 bilateral knee ext/flex; 2-/5 left ankle DF; 0/5 right ankle- and able to increase reps and resistance with LE's. 09/15/2021- Patient technically presents with 2-/5 B hip  flex/abd/add - but he is able to raise his hip up to approx 100 deg which has improved. 3+/5 Bilateral knee ext, 2-/5 left ankle and 0/5 right ankle.  12/08/2021= Patient able to lift left knee at 110 deg of hip flex; presents with 3+/5 knee ext, 2-/5 left ankle DF and 0/5 right ankle DF, 2-/5 bilateral Hip abd in seated position.    12/6: R: knee 3+/5 ext, 2/5 flexion, left knee 3+/5 extension, 3+/5 flexion, R hip: 2+/5 hip add, 2+/5 hip ABD L  hip: 4-/5 hip ABD, 3+/5 hip ADD, 3+/5 hip flexion; 06/06/2022= Patient now presents with 2-/5 right ankle DF/PF;     Time 12     Period Weeks     Status MET    Target Date 03/07/2022         PT LONG TERM GOAL #2    Title Patient will tolerate sitting unsupported demonstrating erect sitting posture for 15 minutes with CGA to demonstrate improved back extensor strength and improved sitting tolerance.     Baseline 01/04/2021- Patient confied to sitting in lift chair or electric power chair with back support and unable to sit upright without physical assistance; 12/12: tolerates <1 minutes upright unsupported sitting. 05/10/2021=static sit with forward trunk lean  in his power wheelchair without back support x approx 3 min. 06/21/2021=Unable to assess today due to patient with acute back pain but on previous visit able to sit x 8 min without back support. 09/15/2021- on last visit- 09/13/2021- patient was able to sit unsupported x 8 min at edge of mat. 10/13/2023 - Patient was able to sit at edge of mat with varying level of assist today from SBA to min A for a total of 20 min. 12/13/2021= Patient demonstrated unsupported sitting at edge of mat for approx 20 min    Time 12     Period Weeks     Status GOAL MET    Target Date 12/08/21          PT LONG TERM GOAL #3    Title Patient will demonstrate ability to perform static standing in // bars > 2 min with Max Assist  without loss of balance and fair posture for improved overall strength for pre-gait and transfer activities.      Baseline 01/04/2021= Patient current uanble to stand- Dependent on hoyer or sit to stand lift for transfers. 05/10/2021=Not appropriate yet- Currently still dependent with all transfers using hoyer. 06/21/2021= Patient continuing now to focus on LE strengthening to prepare for standing-unable to try today due to acute low back pain-  planning on attempting in new cert period. 09/15/2021- Patient has attempted standing 2x in past two week- max Assist of 2 people - only once was he successful to clearing his bottom from chair - Will continue to be a focus during the new certification. 12/13/2021= Patient has been limited secondary to increased overall low back pain during this certification and will require more time to focus on this goal.  12/6: not assessed this date, will assess at date when 2-3 PTs are present for assistance     Time 12     Period Weeks     Status GOAL not appropriate at this time - may attempt in future once Patient presents with improved overall LE strength.                 PT LONG TERM GOAL #4    Title Pt will improve FOTO score by 10 points or more demonstrating improved perceived functional ability     Baseline FOTO 7 on 10/17; 03/15/21: FOTO 12; 05/10/2021 06/21/2021= 1; 09/15/2021= 9; 12/13/2021= Will issue next visit 12/6: 4; 06/06/2022= Will assess next visit; Goal not appropriate as patient is not ambulatory.     Time 12     Period Weeks     Status WITHDRAWN    Target date 11/30/2022         PT LONG TERM GOAL #5    Title Patient will perform sit to stand transfer  with appropriate AD and max assist of 2 people with 75% consistency to prepare for pregait activities.     Baseline 09/15/2021= Patient unable to stand well- unable to clear his bottom off chair with Max assist of 2 persons. 12/13/2021- Goal not appropriate to try yet but will keep and roll over to next cert as shift continues to focus on transfers/standing; 4/24= Patient able to perform active ankle DF/PF with right LE and  able to raise his knee into seated march and clear floor without physical assist today - as previously unable as well as lift right knee ext to near full ROM to improve strength for eventual standing. 09/07/2022- Goal continues to not be appropriate but patient is now standing some and will keep goal active; 11/23/2022= patient is now performing sit to stand with max A +2 and now able to clear his bottom off mat. 11/30/2022- Patient continues to perform sit to stand transfers with Max A+2 and able to clear bottom well off mat but not erect yet-     Time 12     Period Weeks     Status PROGRESSING    Target date  02/22/2023       PT LONG TERM GOAL #6  Title Patient will tolerate sitting unsupported demonstrating erect sitting posture for 30 minutes with CGA to demonstrate improved back extensor strength and improved sitting tolerance.   Baseline 12/13/2021= Patient demonstrated unsupported sitting at edge of mat for approx 20 min;  12/29/2021- Patient performed approx 30 min of dynamic sitting activities today. 06/06/2022= Patient demonstrated ability to sit and perform static and dynamic UE/LE movement with only Supervision.   Time 12   Period Weeks   Status GOAL MET           7.  Patient will tolerate 2 minutes or more of standing in sit to stand lift or with max assist +2 in order to indicate improved lower extremity weightbearing tolerance for progression to standing in parallel bars. Baseline: 1 minute on most recent stand 02/21/22; 06/06/2022- Patient did attempt today after complaining of right LE pain last week. He attempted 3 stands using sit to stand lift. - all over 1 min- last one approx 48 sec- stopped due to fatigue. More erect standing in lift today - still poor gluteal strength but able to activate glutes and extend hips upon command but unable to hold > 5 sec. 07/28/2022- Will attempt standing next visit but patient has been able to stand since last progress note for up to 3 min at a time  at his best. 09/07/2022-  attempted standing with max +2 and patient able to stand 15-20 sec so will now  revise goal; 11/23/2022- Patient at his best between last 2 visits was able to stand approx 50 sec with max A+2.  11/30/2022- Patient performed 4 trials of static standing - max A +2- clearing bottom and using forearms to bear weight  Goal status: Revised Target date: 02/22/2023   8. Patient will tolerate sitting unsupported demonstrating ability to perform dynamic UE/LE activities for 30 minutes  independently to demonstrate ability to sit at edge of bed to eat or perform some ADL's/exercise for optimal quality of life.             Baseline: 06/06/2022- Patient able to sit and perform some UE/LE exercises but requires CGA at times for safety- he is able to static sit for 30 min with supervision. 07/27/2022= Patient able to now sit > 30  min with Supervision only - performing static and dynamic activities. Will keep goal active to ensure patient is consistent. 09/07/2022=  Patient able to demonstrate consistent ability to sit at edge of mat during visits             Goal Status: MET             Target date: 08/29/2022        Plan     Clinical Impression Statement  Patient resumed standing today and making progress- Able to standing consistently longer today with improved gluteat activation. Still profoundly weak and requires Max A+2 for safety.  Pt will continue to benefit from skilled physical therapy intervention to address impairments, improve QOL, and attain therapy goals.    Personal Factors and Comorbidities Comorbidity 3+;Time since onset of injury/illness/exacerbation    Comorbidities CVA, diabetes, Seizures    Examination-Activity Limitations Bathing;Bed Mobility;Bend;Caring for Others;Carry;Dressing;Hygiene/Grooming;Lift;Locomotion Level;Reach Overhead;Self Feeding;Sit;Squat;Stairs;Stand;Transfers;Toileting    Examination-Participation Restrictions Cleaning;Community  Activity;Driving;Laundry;Medication Management;Meal Prep;Occupation;Personal Finances;Shop;Yard Work;Volunteer    Stability/Clinical Decision Making Evolving/Moderate complexity    Rehab Potential Fair    PT Frequency 2x / week    PT Duration 12 weeks    PT Treatment/Interventions ADLs/Self Care Home Management;Cryotherapy;Electrical Stimulation;Moist Heat;Ultrasound;DME Instruction;Gait training;Stair training;Functional mobility training;Therapeutic exercise;Balance training;Patient/family education;Orthotic Fit/Training;Neuromuscular re-education;Wheelchair mobility training;Manual techniques;Passive range of motion;Dry needling;Energy conservation;Taping;Visual/perceptual remediation/compensation;Joint Manipulations    PT Next Visit Plan core strength/motor control in short-sitting, sit to stand if able; Continue with progressive LE Strengthening. Stand activities as appropriate.     PT Home Exercise Plan No changes to HEP today    Consulted and Agree with Plan of Care Patient;Family member/caregiver    Family Member Consulted            12:26 PM, 12/27/22 Louis Meckel, PT Physical Therapist - [facility]  Outpatient Physical Therapy- [facility] [phone]      12/27/2022, 12:26 PM

## 2022-12-26 NOTE — Therapy (Signed)
Occupational Therapy Neuro Treatment Note  Patient Name: Paul Hall MRN: [id number] DOB:Oct 21, 1995, 27 y.o., male Today's Date: 12/26/2022  PCP: Dr. Sherwood Gambler REFERRING PROVIDER: Dr. Sherwood Gambler   OT End of Session - 12/26/22 1708     Visit Number [id number]    Number of Visits 84    Date for OT Re-Evaluation 02/08/23    Authorization Type 09/26/2022    OT Start Time 1622    OT Stop Time 1700    OT Time Calculation (min) 38 min    Activity Tolerance Patient tolerated treatment well    Behavior During Therapy WFL for tasks assessed/performed                 Past Medical History:  Diagnosis Date   Diabetes mellitus (HCC)    Hypertension    Stroke [facility])    Past Surgical History:  Procedure Laterality Date   IVC FILTER PLACEMENT ([facility] HX)     LEG SURGERY     PEG TUBE PLACEMENT     TRACHEOSTOMY     Patient Active Problem List   Diagnosis Date Noted   Sepsis due to vancomycin resistant Enterococcus species (HCC) 06/06/2019   SIRS (systemic inflammatory response syndrome) (HCC) 06/05/2019   Acute lower UTI 06/05/2019   VRE (vancomycin-resistant Enterococci) infection 06/05/2019   Anemia 06/05/2019   Skin ulcer of sacrum with necrosis of muscle (HCC)    Urinary retention    Type 2 diabetes mellitus without complication, with long-term current use of insulin (HCC)    Tachycardia    Lower extremity edema    Acute metabolic encephalopathy    Obstructive sleep apnea    Morbid obesity with BMI of 60.0-69.9, adult (HCC)    Goals of care, counseling/discussion    Palliative care encounter    Sepsis (HCC) 04/27/2019   H/O insulin dependent diabetes mellitus 04/27/2019   History of CVA with residual deficit 04/27/2019   Seizure disorder (HCC) 04/27/2019   Decubitus ulcer of sacral region, stage 4 (HCC) 04/27/2019   ONSET DATE: 01/2019  REFERRING DIAG: CVA/COVID-19  THERAPY DIAG:  Muscle weakness (generalized)  Other lack of coordination  Rationale for Evaluation and  Treatment Rehabilitation  SUBJECTIVE:   SUBJECTIVE STATEMENT:  Pt. reports feeling weak last week. Pt. Reports feeling better today.  Pt accompanied by: self and family member  PERTINENT HISTORY:  Pt. is a 27 y.o. male who was diagnosed with COVID-19, and CVA with resultant quadriplegia. Pt. Was then hospitalized with VRE UTI. PMHx includes: urinary retention, seizure disorder, obstructive sleep disorder, DM Type II, Morbid obesity.   PRECAUTIONS: None  WEIGHT BEARING RESTRICTIONS No  PAIN:  Are you having pain? No reports of pain  FALLS: Has patient fallen in last 6 months? No  LIVING ENVIRONMENT: Lives with: lives with their family Lives in: House/apartment Stairs: No Level Entry Has following equipment at home: Wheelchair (power) and hoyer lift, sit to stand lift  PLOF: Independent  PATIENT GOALS: To be able to engage in more daily care tasks.  OBJECTIVE:   HAND DOMINANCE: Right  ADLs:  Transfers/ambulation related to ADLs: Eating: Pt. reports being able to hold standard utensils, and is starting to engage more in self-feeding tasks, hand to mouth patterns. Pt. Reports that he does as much as he can with the task, and family assists  with the remainder of the task. Grooming: Pt. Is able to initiate holding an electric toothbrush, and brush his teeth. Family assists LB Dressing: Total Assist UE dressing: Pt.  is now able to reach up to actively assist with grasping , and pulling his gown down. Toileting:  Total Assist Bathing: MaxA UB, Total assist LB Tub Shower transfers: N/A Equipment: See above    IADLs: Shopping: Relies on family to assist Light housekeeping: Total Assist Meal Prep: Total Assist Community mobility:   Medication management:  Total Assist  Financial management: N/A Handwriting: Not legible: Pt. Is able to hold a pen with the left hand, and initiate marking the page. Pt.'s eye glasses were not available  MOBILITY STATUS:  Power  w/c  POSTURE COMMENTS:  Pt. Requires position changes in his power w/c  ACTIVITY TOLERANCE: Activity tolerance:  Fair  FUNCTIONAL OUTCOME MEASURES: FOTO: 40  TR score: 45  05/25/2022:   FOTO: 50 TR score: 45  07/20/22: FOTO 55  UPPER EXTREMITY ROM     Active ROM Right eval Right 03/21/2022 Right 05/25/2022 R  07/20/22 Right  07/25/22 Right 08/15/2022 Right 09/26/2022 Right Left eval Left  03/21/2022 Left 05/25/2022 L  07/20/22 Left 07/25/2022 Left  08/15/2022 Left  09/26/2022 Left  11/16/2022  Shoulder flexion 106 scaption 105  Scaption 62 flexion 110 scaption 100  105 105 105 119 123 125 130  136 138 130  Shoulder abduction 114 90 97 100  105 117 120 110 115 115 115  118 121 125  Shoulder adduction                  Shoulder extension                  Shoulder internal rotation                  Shoulder external rotation                  Elbow flexion 120(130) 145 145 140  144 140 142 135(135) 145 145 145  145    Elbow extension -45(-35) -22(-35) -28(-22)  -32(-21) -32(-28) -28(-26) -28(-28) -27(-18) -21(-20) -20(-16)  -25(-10) -23(-10) -21(-10) -20(-18)  Wrist flexion                  Wrist extension -30(10) -25(10) -10(30)  -10(20) -10(25) -20(40) -30(10) 10(50) 19(50) 28(50)  30(50) 35(55) 36(55) 36(60)  Wrist ulnar deviation                  Wrist radial deviation                  Wrist pronation                  Wrist supination   Limited by flexor tone        Limited by flexor tone       (Blank rows = not tested)  UPPER EXTREMITY MMT:     Right eval Right 03/21/2022 Right  05/25/2022 R 07/20/22 Right 08/25/2022 Right 09/26/2022 Right 11/16/2022 Left eval Left 03/21/2022 Left  05/25/2022 L 07/20/22 Left  08/15/2022 Left 09/26/2022 Left 11/16/2022  Shoulder flexion 3-/5 scaption 3-/5 3-/5 3+/5 3+/5 3+/5 3/5 3-/5 3/5 3+/5 4+/5 4+/5 4+/5 4+/5  Shoulder abduction 3-/5 3-/5 3-/5 4/5 4/5 4/5 4/5 3-/5 3-/5 4-/5 4+/5 4+/5 4+5 4+/5  Shoulder adduction                Shoulder  extension                Shoulder internal rotation  Shoulder external rotation                Elbow flexion 3/5 3/5 3+/5 4/5 4+/5 4+/5 4+/5 3/5 3+/5 4-/5 4+/5 5/5 5/5 5/5  Elbow extension 2-/5 2/5 3-/5 4+/5 4+/5 4+/5 4+/5 2-/5 2/5  4+/5 4+/5 4+/5   Wrist flexion                Wrist extension 2-/5 2-/5 2/5 2/5 2/5 2/5 2/5 2/5 2+/5 3-/5  3-/5 3-/5 3-/5  Wrist ulnar deviation                Wrist radial deviation                Wrist pronation                Wrist supination                (Blank rows = not tested)   HAND FUNCTION:  Grip strength: Right: 0 lbs; Left: 0 lbs and Lateral pinch: Right: 5 lbs, Left: 2 lbs  03/21/2022: Lateral pinch: Right: 3.5 lbs, Left: 2 lbs  07/20/22: Grip strength: Right: 3 lbs; Left: 4 lbs and Lateral pinch: Right: 5 lbs, Left: 3 lbs  08/15/2022:  Grip strength: Right: 0 lbs; Left: 5 lbs and Lateral pinch: Right: 2 lbs, Left: 4 lbs  09/26/2022  Grip strength: Right: 0 lbs; Left: 8 lbs and Lateral pinch: Right: 2 lbs, Left: 5 lbs  11/16/2022  Grip strength: Right: 0 lbs; Left: 8 lbs   Bilateral digit PIP/DIP flexion contractures with MP hyperextension with attempts for AROM. Pt. is able to tolerate AROM to the bilateral digits at the initial evaluation however, has a history of pain in the digits.  COORDINATION: Eval: Pt. is unable to grasp 9-hole test pegs. Pt. is able to initiate grasping larger pegs, and is able to hold a pen in the left hand.  07/20/22: 2 min 36 seconds to remove 9 pegs from 9 hole peg test - cues to locate pegs 2/2 low vision. Pt. is able to initiate grasping larger pegs on R hand and is able to hold a pen in the left hand.  08/15/2022:      Vision, and sensation limiting accuracy of 9 hole peg test results. Pt. Was able to grasp and remove vertical pegs intermittently with cues.    SENSATION: Light touch: Impaired   EDEMA:  N/A  MUSCLE TONE: BUE flexor Spasticity  COGNITION Overall cognitive  status: Continue to assess in functional context  VISION:   Subjective report: Pt. was not wearing glasses at the time of the initial eval.  Baseline vision: Vision is very limited. Wears glasses all the time Visual history: History of impaired vision following CVA. Pt. Has received treatment through the [facility] low vision rehabilitation program.   VISION ASSESSMENT: Impaired To be further assessed in functional context  PERCEPTION: Impaired   PRAXIS: Impaired: motor planning  OBSERVATIONS:  Pt reports being on Tramadol   TODAY'S TREATMENT   Therapeutic Ex.:  Pt. performed AROM for right shoulder flexion, abduction. Emphasis was placed on slow prolonged gentle passive stretching for RUE, forearm supination, bilateral wrist extension, wrist extension, digit MP, PIP, and DIP extension following moist heat. ROM was performed to decrease stiffness, and prepare the RUE for functional use. ROM was performed following 5 min. for moist heat to the shoulder right elbow, forearm, wrist, and digits. PROM for Left digit MP, PIP, and DIP extension.  Manual Therapy:  Pt. tolerated soft tissue massage to the right scapular musculature, digit lateral, and sagittal bands 2/2 stiffness, and discomfort in preparation and independent of ROM/ther. Ex.   Self care:  Pt. worked on simulating cutting food using pink resistive theraputty. Pt. worked on holding a Psychologist, counselling with 1" built-up foam handle with the left hand, and initiate the cutting/sawing action.  Pt. worked on stabilizing the resistive  putty to simulate food using a 1" built-up handle fork with the right hand. Pt. worked on using a fork with the left hand to spear food.    PATIENT EDUCATION: Education details: Doctor, general practice and fork positioning for self feeding Person educated: Patient and Parent Education method: Explanation, Demonstration, Tactile cues, and Verbal cues Education comprehension: verbalized understanding, returned  demonstration, verbal cues required, and needs further education  HOME EXERCISE PROGRAM:  Continue ongoing assessment, and continue to provide as needed.   GOALS: Goals reviewed with patient? Yes  SHORT TERM GOALS: Target date: 11/07/2022   To assess splint fit, and make appropriate adjustments to promote good skin integrity through the palmar surface of the bilateral hands.  Baseline: 05/25/22: Goal currently met, however ongoing as needs to assess splint fit arise. 03/23/2022: Pt. is wearing splints a couple of hours at night bilateral resting hand splints. 03/21/2022: Pt. is wearing splints a couple of hours at night bilateral resting hand splints. Goal status: Deferred   LONG TERM GOALS: Target date: 02/08/2023    FOTO score will Improve by 2 points for Pt. perceived improvement with the assessment specific ADL/IADL tasks.  Baseline: 11/16/2022: 52 09/27/2022: 51 07/20/2022: FOTO 55 05/25/2022: FOTO score: 50,  TR score: 45 Eval: FOTO score: 40,  TR score: 45 Goal status: Achieved   2.   Pt. will independently perform oral care for 100% of the task after complete set-up. Baseline: 11/16/2022: Pt. Completes 90% of the task with complete set-up. 09/26/2022: Completes 90% of the task, proximal assist at times required at the elbow.  08/15/2022: Completes 90% of the task, proximal assist at times required at the elbow. 07/20/22: completes 90% of task, limited by shoulder flexion. 05/25/2022:  Pt. Is able to initiate and perform oral care for approximately 90% of the task. Complete set-up required. Assi needed only for the very back teeth. 03/23/2022: Pt. Is able to initiate and perform oral care for approximately 75% of the task. Pt. Requires assist at proximally at the elbow for through oral care. 03/21/2022: Pt. Is able to initiate and perform oral care for approximately 75% of the task. Pt. Requires assist at proximally at the elbow for through oral care. Eval: Pt. is able to initiate using an electric  toothbrush. Pt. requires assist for set-up, and assist for thoroughness, and as he Pt. fatigues. Goal status: Ongoing  3.  Pt. Will be modified independence with self-feeding for 75% using a swivel spoon, and universal fork after complete set-up Baseline: 11/16/2022: 100% with finger foods, Pt. Is initiating with a swivel spoon, and universal cuff for fork use, however requires assist for consistency, and accuracy.  09/26/2022: 90% using the spoon, assist at time with the forearm motion, and grip on the utensils. 100% for finger foods.  08/15/2022:  90% using the spoon, assit at time with the forearm motion, and grip on the utensils. 100% for finger foods-independent with set-up- unsupervised.  07/20/22: self-feeds cereal using spoon 90% of task. 05/25/2022: Pt. Is able to use a spoon to scoop cereal when feeding himself cereal 85% of the time.  Pt. Is able to feed himself snack/finger foods 100% of the time. Pt. Continues to work on consistency of  stabilizing a cup/mug when drinking. Pt. Is able to grasp a water bottle with assist initially, with assist tapering off as he drinks.03/23/2022: Pt. Is able to perform scooping cereal for 75% of the time. Pt. required assist, and support at the left elbow, and Pt. Presents with limited forearm supination when using the spoon, and bringing it towards his mouth. Pt. Is able to use a fork to spear items, and perform the hand to mouth pattern.  03/21/2022: Pt. Is able to perform scooping cereal for 75% of the time. Pt. required assist, and support at the left elbow, and Pt. presents with limited forearm supination when using the spoon, and bringing it towards his mouth. Pt. Is able to use a fork to spear items, and perform the hand to mouth pattern.  Eval: Pt. is able to hold standard standard utensils. Pt. Performs as much of the task as he, can and has assistance for the remainder. Goal status:  Revised  4.  Pt. Will improve grasp patterns and consistently grasp 1/4"  objects for ADL, and IADL tasks.  Baseline: 11/16/2022: Continues to grasp 1/4" pegs with min a + visual cues, consistently grasping 1/2" objects with visual cues 09/26/2022: Continues to grasp 1/4" pegs with min a + visual cues, consistently grasping 1/2" objects with visual cues. 08/15/2022: grasps 1/4" pegs with min a + visual cues, consistently grasping 1/2" objects with visual cues. 07/20/22: grasps 1/4" pegs with min a + visual cues, consistently grasping 1/2" objects with visual cues. 05/25/2022: Pt. Is working on improving consistency of grasping 1/2" objects with visual cues.  03/23/2022: Pt. Is able to grasp 1" objects consistently,and continues to work on the hand patterns needed to grasp 1/2" objects.03/21/2022: Pt. Is able to grasp 1" objects consistently,and continues to work on the hand patterns needed to grasp 1/2" objects. Eval: Pt. is able to grasp 1" objects intermittently using a lateral grasp pattern. Goal status: Progressing  5.  Pt. will independently write his name legibly with letter sizes under 1". Baseline: 11/16/2022: Pt. Continues to be able to write name with smaller letter size. Pt. Is signing name with a computer styus. Pt. Requires visual cues, and assist 09/26/2022: Pt. Continues to be able to write name with smaller letter size. Pt. Is signing name with a computer styus. Pt. Requires visual cues, and assist. 08/15/2022: Pt. Is able to write name with smaller letter size. Pt. Is signing name with a computer styus. Pt. Requires visual cues, and assist. 07/20/22: stabilizing assist to write name with 75% legibility with 2" letters. 05/25/2022: Pt. to continue to work towards formulating grasp patterns in preparation for grasping a large width pen. Pt. Requires visual cues. 03/23/2022: Pt. Is able to write his name with modA, however has difficulty with formulating letter sizes less than 2" in size with 50% legibility for the 3 letters of his name.03/21/2022: Pt. Is able to write his name  with modA, however has difficulty with formulating letter sizes less than 2" in size with 50% legibility for the 3 letters of his name. Eval: Pt is able to hold a thin marker with his left hand, and formulate a line, and initiate a circular pattern (Pt. without glasses today) Goal status: Progressing  6. Pt. Will reach up to comb/brush his hair with minA.  Baseline: 11/16/2022: Pt. Is able to use a hair pick for the left side  of his head using his left hand. Pt. Presents with difficulty reaching to the right side of his head.09/26/2022: Pt. Is able to use a hair pick for the left side of his head using his left hand. Pt. Presents with difficulty reaching to the right side of his head. 5/13/202: 75% using a hair pick, assist to the right side of the head. 07/20/22: reaches 75% of head, assist for far R side of head, fatigues quickly. 05/25/2022: Pt. Is able to reach up with the left hand to the left side, top, and back of his head. 03/23/2022: Pt. is now able to more consistently initiate reaching up to his head with his left hand in preparation for haircare12/18/2023: Pt. is now able to more consistently initiate reaching up to his head with his left hand in preparation for haircare. Eval: Pt. is able to initiate reaching up for hair care with a long handled brush, however is unable to sustain UE's in elevation to perform the task.     Goal status: Ongoing  7. Pt. Will independently navigate the w/c through his environment with minA with visual scanning, and hand placement on the controls.  Baseline: 11/16/2022: Deferred 2/2 vision 09/26/2022: Pt. Is now navigating his w/c ouside the home, and around his block. 08/15/2022: Pt. Requires minA to setup hand on controls and MIN cues to navigate the w/c in wide spaces, requires MIN - MOD cues to navigate the w/c through more narrow doorways, and tighter turns - varies based on good vs bad vision days.07/20/22: Pt. Requires minA to setup hand on controls and MIN cues to  navigate the w/c in wide spaces, requires MIN - MOD cues to navigate the w/c through more narrow doorways, and tighter turns - varies based on good vs bad vision days. 05/25/2022: Pt. Requires minA  and  cues to navigate the w/c in wide spaces, and requires Mod cues to navigate the w/c through more narrow doorways, and tighter turns. Pt. Requires max cues for scanning through the environment, and moderate cues for hand placement on the controls.     Goal status: Deferred 2/2 vision    8. Pt. Will improve bilateral grip strength to be able to independently grasp, and pull up blankets, and linens.   Baseline: 11/16/2022: R: 0 L: 8# Pt. Is more consistently grasping his blankets and attempting to pull them up. 09/26/2022: R: 0  L: 8# Pt. is attempting to grasp, and pull blankets up more, using mostly the left hand. 08/15/2022: Pt. has difficulty securely holding, and pulling up blankets, and linens.    Goal status: Ongoing  9. Pt. will consistently actively control the releasing  of blankets, covers, and linens from his hands once they are in the desired position over him. Baseline: 11/16/2022: Pt. continues to improve with actively releasing blankets form his hands. 09/26/2022: Pt. is improving with actively releasing blankets form his hands. 08/15/2022: Pt. has difficulty with controlled releasing of blankets/linens from his grasp.     Goal status: Ongoing    10. Pt. Will improve bilateral UE strength by 2 MM grades to assist with ADLs, and IADLs  Baseline: 11/16/2022: Right: shoulder flexion: 3/5, abduction: 4/5, elbow flexion: 4+/5 , extension: 4+/5 wrist extension: 2/5; Left: shoulder flexion: 4+/5, abduction: 4+/5, elbow flexion: 5/5 , extension:  wrist extension: 3-/5 Right: shoulder flexion: 3+/5, abduction: 4/5, elbow flexion: 4+/5 , extension: 4+/5 wrist extension: 2/5; Left: shoulder flexion: 4+/5, abduction: 4+/5, elbow flexion: 5/5 , extension: 4+/5 wrist extension: 3-/5  11. Pt. Will improve right  wrist extension by 10 degrees to initiate reaching for items in preparation for ADLs. Baseline: 11/16/2022: -30(10)    Goal status: New   ASSESSMENT: CLINICAL IMPRESSION:  Pt. is s/p Botox 1 week ago.  Pt. with no reports of pain or discomfort today. Pt. continues to present with limited ROM, tightness, in the right 2/2  flexor tone, and tightness in the RUE, forearm, wrist, and digits, however Pt. presents with less tightness this week. Pt. tolerated moist heat, ROM, and stretching well. Pt. was able to hold a fork and knife, and initiate cutting/sawing action with visual cues. Pt. continues to work on improving BUE functioning, ROM, strength, motor control, [facility] skills, and visual compensatory strategies to be able to independently pull up, and release blankets/covers, improve writing legibility, improve self-care including oral care, and utensil use during self-feeding tasks, and maximize independence with ADLs, and IADLs.     PERFORMANCE DEFICITS in functional skills including ADLs, IADLs, coordination, dexterity, proprioception, ROM, strength, pain, FMC, GMC, decreased knowledge of use of DME, and UE functional use, cognitive skills including safety awareness, and psychosocial skills including coping strategies, environmental adaptation, habits, and routines and behaviors.   IMPAIRMENTS are limiting patient from ADLs, IADLs, leisure, and social participation.   COMORBIDITIES may have co-morbidities  that affects occupational performance. Patient will benefit from skilled OT to address above impairments and improve overall function.  MODIFICATION OR ASSISTANCE TO COMPLETE EVALUATION: Maximum or significant modification of tasks or assist is necessary to complete an evaluation.  OT OCCUPATIONAL PROFILE AND HISTORY: Comprehensive assessment: Review of records and extensive additional review of physical, cognitive, psychosocial history related to current functional performance.  CLINICAL DECISION  MAKING: High - multiple treatment options, significant modification of task necessary  REHAB POTENTIAL: Fair    EVALUATION COMPLEXITY: High    PLAN: OT FREQUENCY: 2 x's a week  OT DURATION:12 weeks  PLANNED INTERVENTIONS: self care/ADL training, therapeutic exercise, therapeutic activity, neuromuscular re-education, manual therapy, passive range of motion, patient/family education, and cognitive remediation/compensation RECOMMENDED OTHER SERVICES: PT  CONSULTED AND AGREED WITH PLAN OF CARE: Patient and family member/caregiver  PLAN FOR NEXT SESSION: Initiate treatment  Olegario Messier, MS, OTR/L   12/26/22, 5:10 PM

## 2022-12-28 ENCOUNTER — Ambulatory Visit: Payer: Medicare Other | Admitting: Occupational Therapy

## 2022-12-28 ENCOUNTER — Ambulatory Visit: Payer: Medicare Other

## 2022-12-28 DIAGNOSIS — I6930 Unspecified sequelae of cerebral infarction: Secondary | ICD-10-CM | POA: Diagnosis not present

## 2022-12-28 DIAGNOSIS — M6281 Muscle weakness (generalized): Secondary | ICD-10-CM

## 2022-12-28 DIAGNOSIS — R269 Unspecified abnormalities of gait and mobility: Secondary | ICD-10-CM

## 2022-12-28 DIAGNOSIS — R278 Other lack of coordination: Secondary | ICD-10-CM

## 2022-12-28 DIAGNOSIS — R262 Difficulty in walking, not elsewhere classified: Secondary | ICD-10-CM

## 2022-12-28 DIAGNOSIS — R2681 Unsteadiness on feet: Secondary | ICD-10-CM

## 2022-12-28 NOTE — Therapy (Signed)
Occupational Therapy Neuro Treatment Note  Patient Name: Paul Hall MRN: [id number] DOB:09/24/1995, 27 y.o., male Today's Date: 12/28/2022  PCP: Dr. Sherwood Gambler REFERRING PROVIDER: Dr. Sherwood Gambler   OT End of Session - 12/28/22 1721     Visit Number [id number]    Number of Visits 84    Date for OT Re-Evaluation 02/08/23    OT Start Time 1615    OT Stop Time 1710    OT Time Calculation (min) 55 min    Equipment Utilized During Treatment tilt in space power wc    Activity Tolerance Patient tolerated treatment well    Behavior During Therapy WFL for tasks assessed/performed                  Past Medical History:  Diagnosis Date   Diabetes mellitus (HCC)    Hypertension    Stroke [facility])    Past Surgical History:  Procedure Laterality Date   IVC FILTER PLACEMENT ([facility] HX)     LEG SURGERY     PEG TUBE PLACEMENT     TRACHEOSTOMY     Patient Active Problem List   Diagnosis Date Noted   Sepsis due to vancomycin resistant Enterococcus species (HCC) 06/06/2019   SIRS (systemic inflammatory response syndrome) (HCC) 06/05/2019   Acute lower UTI 06/05/2019   VRE (vancomycin-resistant Enterococci) infection 06/05/2019   Anemia 06/05/2019   Skin ulcer of sacrum with necrosis of muscle (HCC)    Urinary retention    Type 2 diabetes mellitus without complication, with long-term current use of insulin (HCC)    Tachycardia    Lower extremity edema    Acute metabolic encephalopathy    Obstructive sleep apnea    Morbid obesity with BMI of 60.0-69.9, adult (HCC)    Goals of care, counseling/discussion    Palliative care encounter    Sepsis (HCC) 04/27/2019   H/O insulin dependent diabetes mellitus 04/27/2019   History of CVA with residual deficit 04/27/2019   Seizure disorder (HCC) 04/27/2019   Decubitus ulcer of sacral region, stage 4 (HCC) 04/27/2019   ONSET DATE: 01/2019  REFERRING DIAG: CVA/COVID-19  THERAPY DIAG:  Muscle weakness (generalized)  Rationale for Evaluation and  Treatment Rehabilitation  SUBJECTIVE:   SUBJECTIVE STATEMENT:  Pt. Reports doing well today.  Pt accompanied by: self and family member  PERTINENT HISTORY:  Pt. is a 27 y.o. male who was diagnosed with COVID-19, and CVA with resultant quadriplegia. Pt. Was then hospitalized with VRE UTI. PMHx includes: urinary retention, seizure disorder, obstructive sleep disorder, DM Type II, Morbid obesity.   PRECAUTIONS: None  WEIGHT BEARING RESTRICTIONS No  PAIN:  Are you having pain? No reports of pain  FALLS: Has patient fallen in last 6 months? No  LIVING ENVIRONMENT: Lives with: lives with their family Lives in: House/apartment Stairs: No Level Entry Has following equipment at home: Wheelchair (power) and hoyer lift, sit to stand lift  PLOF: Independent  PATIENT GOALS: To be able to engage in more daily care tasks.  OBJECTIVE:   HAND DOMINANCE: Right  ADLs:  Transfers/ambulation related to ADLs: Eating: Pt. reports being able to hold standard utensils, and is starting to engage more in self-feeding tasks, hand to mouth patterns. Pt. Reports that he does as much as he can with the task, and family assists  with the remainder of the task. Grooming: Pt. Is able to initiate holding an electric toothbrush, and brush his teeth. Family assists LB Dressing: Total Assist UE dressing: Pt. is now able to  reach up to actively assist with grasping , and pulling his gown down. Toileting:  Total Assist Bathing: MaxA UB, Total assist LB Tub Shower transfers: N/A Equipment: See above    IADLs: Shopping: Relies on family to assist Light housekeeping: Total Assist Meal Prep: Total Assist Community mobility:   Medication management:  Total Assist  Financial management: N/A Handwriting: Not legible: Pt. Is able to hold a pen with the left hand, and initiate marking the page. Pt.'s eye glasses were not available  MOBILITY STATUS:  Power w/c  POSTURE COMMENTS:  Pt. Requires position  changes in his power w/c  ACTIVITY TOLERANCE: Activity tolerance:  Fair  FUNCTIONAL OUTCOME MEASURES: FOTO: 40  TR score: 45  05/25/2022:   FOTO: 50 TR score: 45  07/20/22: FOTO 55  UPPER EXTREMITY ROM     Active ROM Left eval Left  03/21/2022 Left 05/25/2022 L  07/20/22 Left 07/25/2022 Left  08/15/2022 Left  09/26/2022 Left  11/16/2022 Left 12/28/2022  Shoulder flexion 119 123 125 130  136 138 130 142  Shoulder abduction 110 115 115 115  118 121 125 142  Shoulder adduction           Shoulder extension           Shoulder internal rotation           Shoulder external rotation           Elbow flexion 135(135) 145 145 145  145     Elbow extension -27(-18) -21(-20) -20(-16)  -25(-10) -23(-10) -21(-10) -20(-18) -20(-18)  Wrist flexion           Wrist extension 10(50) 19(50) 28(50)  30(50) 35(55) 36(55) 36(60) 40(60)  Wrist ulnar deviation           Wrist radial deviation           Wrist pronation           Wrist supination   Limited by flexor tone        (Blank rows = not tested)    Active ROM Right eval Right 03/21/2022 Right 05/25/2022 R  07/20/22 Right  07/25/22 Right 08/15/2022 Right 09/26/2022 Right 11/16/2022 Right 12/28/2022  Shoulder flexion 106 scaption 105  Scaption 62 flexion 110 scaption 100  105 105 105 112 Scaption  Shoulder abduction 114 90 97 100  105 117 120 124  Shoulder adduction           Shoulder extension           Shoulder internal rotation           Shoulder external rotation           Elbow flexion 120(130) 145 145 140  144 140 142 148  Elbow extension -45(-35) -22(-35) -28(-22)  -32(-21) -32(-28) -28(-26) -28(-28) -27(-26)  Wrist flexion           Wrist extension -30(10) -25(10) -10(30)  -10(20) -10(25) -20(40) -30(10) -22(34)  Wrist ulnar deviation           Wrist radial deviation           Wrist pronation           Wrist supination   Limited by flexor tone          12/28/2022: Thumb radial abduction: Right: 12(46), Left: 40(54)  Digits:  Bilateral 2nd through 5th digit: MP Hyperextension with PIP, an DIP flexion     UPPER EXTREMITY MMT:     Left eval Left 03/21/2022 Left  05/25/2022  L 07/20/22 Left  08/15/2022 Left 09/26/2022 Left 11/16/2022 Left 12/28/2022  Shoulder flexion 3-/5 3/5 3+/5 4+/5 4+/5 4+/5 4+/5 4+/5  Shoulder abduction 3-/5 3-/5 4-/5 4+/5 4+/5 4+5 4+/5 4+/5  Shoulder adduction          Shoulder extension          Shoulder internal rotation          Shoulder external rotation          Elbow flexion 3/5 3+/5 4-/5 4+/5 5/5 5/5 5/5 5/5  Elbow extension 2-/5 2/5  4+/5 4+/5 4+/5  4/5  Wrist flexion          Wrist extension 2/5 2+/5 3-/5  3-/5 3-/5 3-/5 3/5  Wrist ulnar deviation          Wrist radial deviation          Wrist pronation          Wrist supination          (Blank rows = not tested)  Right eval Right 03/21/2022 Right  05/25/2022 R 07/20/22 Right 08/15/2022 Right 09/26/2022 Right 11/16/2022 Right 12/28/2022  Shoulder flexion 3-/5 scaption 3-/5 3-/5 3+/5 3+/5 3+/5 3/5 3/5  Shoulder abduction 3-/5 3-/5 3-/5 4/5 4/5 4/5 4/5 4/5  Shoulder adduction          Shoulder extension          Shoulder internal rotation          Shoulder external rotation          Elbow flexion 3/5 3/5 3+/5 4/5 4+/5 4+/5 4+/5 4+/5  Elbow extension 2-/5 2/5 3-/5 4+/5 4+/5 4+/5 4+/5 4-/5  Wrist flexion          Wrist extension 2-/5 2-/5 2/5 2/5 2/5 2/5 2/5 2/5  Wrist ulnar deviation          Wrist radial deviation          Wrist pronation          Wrist supination            HAND FUNCTION:  Grip strength: Right: 0 lbs; Left: 0 lbs and Lateral pinch: Right: 5 lbs, Left: 2 lbs  03/21/2022: Lateral pinch: Right: 3.5 lbs, Left: 2 lbs  07/20/22: Grip strength: Right: 3 lbs; Left: 4 lbs and Lateral pinch: Right: 5 lbs, Left: 3 lbs  08/15/2022:  Grip strength: Right: 0 lbs; Left: 5 lbs and Lateral pinch: Right: 2 lbs, Left: 4 lbs  09/26/2022  Grip strength: Right: 0 lbs; Left: 8 lbs and Lateral pinch: Right: 2 lbs,  Left: 5 lbs  11/16/2022  Grip strength: Right: 0 lbs; Left: 8 lbs  9/25/20204  Grip strength: Right: 0 lbs; Left: 8 lbs    Bilateral digit PIP/DIP flexion contractures with MP hyperextension with attempts for AROM. Pt. is able to tolerate AROM to the bilateral digits at the initial evaluation however, has a history of pain in the digits.  COORDINATION: Eval: Pt. is unable to grasp 9-hole test pegs. Pt. is able to initiate grasping larger pegs, and is able to hold a pen in the left hand.  07/20/22: 2 min 36 seconds to remove 9 pegs from 9 hole peg test - cues to locate pegs 2/2 low vision. Pt. is able to initiate grasping larger pegs on R hand and is able to hold a pen in the left hand.  08/15/2022:      Vision, and sensation limiting accuracy of 9 hole peg test results. Pt. Was  able to grasp and remove vertical pegs intermittently with cues.    SENSATION: Light touch: Impaired   EDEMA:  N/A  MUSCLE TONE: BUE flexor Spasticity  COGNITION Overall cognitive status: Continue to assess in functional context  VISION:   Subjective report: Pt. was not wearing glasses at the time of the initial eval.  Baseline vision: Vision is very limited. Wears glasses all the time Visual history: History of impaired vision following CVA. Pt. Has received treatment through the [facility] low vision rehabilitation program.   VISION ASSESSMENT: Impaired To be further assessed in functional context  PERCEPTION: Impaired   PRAXIS: Impaired: motor planning  OBSERVATIONS:  Pt reports being on Tramadol   TODAY'S TREATMENT   Therapeutic Ex.:  Pt. performed AROM for right shoulder flexion, abduction. Emphasis was placed on slow prolonged gentle passive stretching for RUE, forearm supination, bilateral wrist extension, wrist extension, digit MP, PIP, and DIP extension following moist heat. ROM was performed to decrease stiffness, and prepare the RUE for functional use. ROM was performed following moist  heat to the shoulder right elbow, forearm, wrist, and digits. PROM for Left digit MP, PIP, and DIP extension.     PATIENT EDUCATION: Education details: Doctor, general practice and fork positioning for self feeding Person educated: Patient and Parent Education method: Explanation, Demonstration, Tactile cues, and Verbal cues Education comprehension: verbalized understanding, returned demonstration, verbal cues required, and needs further education  HOME EXERCISE PROGRAM:  Continue ongoing assessment, and continue to provide as needed.   GOALS: Goals reviewed with patient? Yes  SHORT TERM GOALS: Target date: 11/07/2022   To assess splint fit, and make appropriate adjustments to promote good skin integrity through the palmar surface of the bilateral hands.  Baseline: 05/25/22: Goal currently met, however ongoing as needs to assess splint fit arise. 03/23/2022: Pt. is wearing splints a couple of hours at night bilateral resting hand splints. 03/21/2022: Pt. is wearing splints a couple of hours at night bilateral resting hand splints. Goal status: Deferred   LONG TERM GOALS: Target date: 02/08/2023    FOTO score will Improve by 2 points for Pt. perceived improvement with the assessment specific ADL/IADL tasks.  Baseline: 12/28/2022: FOTO score: 56; 11/16/2022: 52 09/27/2022: 51 07/20/2022: FOTO 55 05/25/2022: FOTO score: 50,  TR score: 45 Eval: FOTO score: 40,  TR score: 45 Goal status: Achieved   2.   Pt. will independently perform oral care for 100% of the task after complete set-up. Baseline: 12/28/2022:  Pt. Continues to complete 90% of the task with complete set-up, with visual cues, and occassional assist of the right elbow. 11/16/2022: Pt. Completes 90% of the task with complete set-up. 09/26/2022: Completes 90% of the task, proximal assist at times required at the elbow.  08/15/2022: Completes 90% of the task, proximal assist at times required at the elbow. 07/20/22: completes 90% of task, limited by  shoulder flexion. 05/25/2022:  Pt. Is able to initiate and perform oral care for approximately 90% of the task. Complete set-up required. Assi needed only for the very back teeth. 03/23/2022: Pt. Is able to initiate and perform oral care for approximately 75% of the task. Pt. Requires assist at proximally at the elbow for through oral care. 03/21/2022: Pt. Is able to initiate and perform oral care for approximately 75% of the task. Pt. Requires assist at proximally at the elbow for through oral care. Eval: Pt. is able to initiate using an electric toothbrush. Pt. requires assist for set-up, and assist for thoroughness, and as he  Pt. fatigues. Goal status: Ongoing  3.  Pt. Will be modified independence with self-feeding for 100% using a swivel spoon, and standard fork after complete set-up Baseline: 12/28/2022: Pt. Is now feeding himself 90% of the time using a swivel spoon with the left hand, and 95% of the time with the fork. Pt. Continues to have difficulty with cutting food. 11/16/2022: 100% with finger foods, Pt. Is initiating with a swivel spoon, and universal cuff for fork use, however requires assist for consistency, and accuracy.  09/26/2022: 90% using the spoon, assist at time with the forearm motion, and grip on the utensils. 100% for finger foods.  08/15/2022:  90% using the spoon, assit at time with the forearm motion, and grip on the utensils. 100% for finger foods-independent with set-up- unsupervised.  07/20/22: self-feeds cereal using spoon 90% of task. 05/25/2022: Pt. Is able to use a spoon to scoop cereal when feeding himself cereal 85% of the time. Pt. Is able to feed himself snack/finger foods 100% of the time. Pt. Continues to work on consistency of  stabilizing a cup/mug when drinking. Pt. Is able to grasp a water bottle with assist initially, with assist tapering off as he drinks.03/23/2022: Pt. Is able to perform scooping cereal for 75% of the time. Pt. required assist, and support at the left  elbow, and Pt. Presents with limited forearm supination when using the spoon, and bringing it towards his mouth. Pt. Is able to use a fork to spear items, and perform the hand to mouth pattern.  03/21/2022: Pt. Is able to perform scooping cereal for 75% of the time. Pt. required assist, and support at the left elbow, and Pt. presents with limited forearm supination when using the spoon, and bringing it towards his mouth. Pt. Is able to use a fork to spear items, and perform the hand to mouth pattern.  Eval: Pt. is able to hold standard standard utensils. Pt. Performs as much of the task as he, can and has assistance for the remainder. Goal status:  Met/Revised to 100% of the meal with a swivel spoon, and standard fork.  4.  Pt. Will improve grasp patterns and consistently grasp 1/4" objects for ADL, and IADL tasks.  Baseline: 12/28/2022: Continues to grasp 1/4" pegs with min a + visual cues, consistently grasping 1/2" objects with visual cues 11/16/2022: Continues to grasp 1/4" pegs with min a + visual cues, consistently grasping 1/2" objects with visual cues 09/26/2022: Continues to grasp 1/4" pegs with min a + visual cues, consistently grasping 1/2" objects with visual cues. 08/15/2022: grasps 1/4" pegs with min a + visual cues, consistently grasping 1/2" objects with visual cues. 07/20/22: grasps 1/4" pegs with min a + visual cues, consistently grasping 1/2" objects with visual cues. 05/25/2022: Pt. Is working on improving consistency of grasping 1/2" objects with visual cues.  03/23/2022: Pt. Is able to grasp 1" objects consistently,and continues to work on the hand patterns needed to grasp 1/2" objects.03/21/2022: Pt. Is able to grasp 1" objects consistently,and continues to work on the hand patterns needed to grasp 1/2" objects. Eval: Pt. is able to grasp 1" objects intermittently using a lateral grasp pattern. Goal status: Ongoing  5.  Pt. will independently write his name legibly with letter sizes under  1". Baseline: 12/28/2022: Pt. is now using an IPad Pen. 11/16/2022: Pt. Continues to be able to write name with smaller letter size. Pt. Is signing name with a computer styus. Pt. Requires visual cues, and assist 09/26/2022: Pt. Continues  to be able to write name with smaller letter size. Pt. Is signing name with a computer styus. Pt. Requires visual cues, and assist. 08/15/2022: Pt. Is able to write name with smaller letter size. Pt. Is signing name with a computer styus. Pt. Requires visual cues, and assist. 07/20/22: stabilizing assist to write name with 75% legibility with 2" letters. 05/25/2022: Pt. to continue to work towards formulating grasp patterns in preparation for grasping a large width pen. Pt. Requires visual cues. 03/23/2022: Pt. Is able to write his name with modA, however has difficulty with formulating letter sizes less than 2" in size with 50% legibility for the 3 letters of his name.03/21/2022: Pt. Is able to write his name with modA, however has difficulty with formulating letter sizes less than 2" in size with 50% legibility for the 3 letters of his name. Eval: Pt is able to hold a thin marker with his left hand, and formulate a line, and initiate a circular pattern (Pt. without glasses today) Goal status: Ongoing  6. Pt. Will reach up to comb/brush his hair with minA.  Baseline: 12/28/2022: Pt. Is progressing, and is now able to use a hair pick with the left hand on the right side of his head, top, and back of the head. Pt. Continues to have difficulty reaching further to the right side of his head with the left. 11/16/2022: Pt. Is able to use a hair pick for the left side of his head using his left hand. Pt. Presents with difficulty reaching to the right side of his head.09/26/2022: Pt. Is able to use a hair pick for the left side of his head using his left hand. Pt. Presents with difficulty reaching to the right side of his head. 5/13/202: 75% using a hair pick, assist to the right side of the  head. 07/20/22: reaches 75% of head, assist for far R side of head, fatigues quickly. 05/25/2022: Pt. Is able to reach up with the left hand to the left side, top, and back of his head. 03/23/2022: Pt. is now able to more consistently initiate reaching up to his head with his left hand in preparation for haircare12/18/2023: Pt. is now able to more consistently initiate reaching up to his head with his left hand in preparation for haircare. Eval: Pt. is able to initiate reaching up for hair care with a long handled brush, however is unable to sustain UE's in elevation to perform the task.     Goal status: Progressing/Ongoing  7. Pt. Will independently navigate the w/c through his environment with minA with visual scanning, and hand placement on the controls.  Baseline: 11/16/2022: Deferred 2/2 vision 09/26/2022: Pt. Is now navigating his w/c ouside the home, and around his block. 08/15/2022: Pt. Requires minA to setup hand on controls and MIN cues to navigate the w/c in wide spaces, requires MIN - MOD cues to navigate the w/c through more narrow doorways, and tighter turns - varies based on good vs bad vision days.07/20/22: Pt. Requires minA to setup hand on controls and MIN cues to navigate the w/c in wide spaces, requires MIN - MOD cues to navigate the w/c through more narrow doorways, and tighter turns - varies based on good vs bad vision days. 05/25/2022: Pt. Requires minA  and  cues to navigate the w/c in wide spaces, and requires Mod cues to navigate the w/c through more narrow doorways, and tighter turns. Pt. Requires max cues for scanning through the environment, and moderate cues for hand  placement on the controls.     Goal status: Deferred 2/2 vision    8. Pt. Will improve bilateral grip strength to be able to independently grasp, and pull up blankets, and linens.   Baseline: 12/28/2023: R: 0#, L: 5# Pt. Is now able to more consistently grasp and pull blankets up over him. 11/16/2022: R: 0 L: 8# Pt. Is more  consistently grasping his blankets and attempting to pull them up. 09/26/2022: R: 0  L: 8# Pt. is attempting to grasp, and pull blankets up more, using mostly the left hand. 08/15/2022: Pt. has difficulty securely holding, and pulling up blankets, and linens.    Goal status: Partially met  9. Pt. will consistently actively control the releasing  of blankets, covers, and linens from his hands once they are in the desired position over him. Baseline: 12/28/2022: Pt. Is consistently actively able to release linens once they are in the desired position over him. 11/16/2022: Pt. continues to improve with actively releasing blankets form his hands. 09/26/2022: Pt. is improving with actively releasing blankets form his hands. 08/15/2022: Pt. has difficulty with controlled releasing of blankets/linens from his grasp.     Goal status: Achieved    10. Pt. Will improve bilateral UE strength by 2 MM grades to assist with ADLs, and IADLs  Baseline: 12/28/2022: Right: shoulder flexion: 3/5, abduction: 4/5, elbow flexion: 4+/5 , extension: 4-/5 wrist extension: 2/5; Left: shoulder flexion: 4+/5, abduction: 4+/5, elbow flexion: 5/5 , extension: 4/5  wrist extension: 3/5 Right: shoulder flexion: 3+/5, abduction: 4/5, elbow flexion: 4+/5 , extension: 4+/5 wrist extension: 2/5; Left: shoulder flexion: 4+/5, abduction: 4+/5, elbow flexion: 5/5 , extension: 4+/5 wrist extension: 3-/5 11/16/2022: Right: shoulder flexion: 3/5, abduction: 4/5, elbow flexion: 4+/5 , extension: 4+/5 wrist extension: 2/5; Left: shoulder flexion: 4+/5, abduction: 4+/5, elbow flexion: 5/5 , extension:  wrist extension: 3-/5 Right: shoulder flexion: 3+/5, abduction: 4/5, elbow flexion: 4+/5 , extension: 4+/5 wrist extension: 2/5; Left: shoulder flexion: 4+/5, abduction: 4+/5, elbow flexion: 5/5 , extension: 4+/5 wrist extension: 3-/5 Goal status: Ongoing  11. Pt. Will improve right wrist extension by 10 degrees to initiate reaching for items in preparation  for ADLs. Baseline: 12/28/2022: -22(34) 11/16/2022: -30(10)    Goal status: Progressing   ASSESSMENT: CLINICAL IMPRESSION:  Pt. Is making consistent steady progress over this past progress reporting period. Pt. has progressed with BUEs AROM, and continues to increase engagement of the BUEs during daily tasks. Is improving with self feeding skills, grooming tasks, reaching further while performing hair care,  grasping and pulling up linens over him,  then actively releasing the blankets from his fingers consistently on his own volition. Pt. Is improving with bilateral thumb radial abduction, and bilateral wrist extension. Pt. continues to be highly motivated to continue to work on improving BUE functioning, ROM, strength, motor control, [facility] skills, and visual compensatory strategies in order to maximize independence with daily ADLs, and IADL functioning.      PERFORMANCE DEFICITS in functional skills including ADLs, IADLs, coordination, dexterity, proprioception, ROM, strength, pain, FMC, GMC, decreased knowledge of use of DME, and UE functional use, cognitive skills including safety awareness, and psychosocial skills including coping strategies, environmental adaptation, habits, and routines and behaviors.   IMPAIRMENTS are limiting patient from ADLs, IADLs, leisure, and social participation.   COMORBIDITIES may have co-morbidities  that affects occupational performance. Patient will benefit from skilled OT to address above impairments and improve overall function.  MODIFICATION OR ASSISTANCE TO COMPLETE EVALUATION: Maximum or significant modification  of tasks or assist is necessary to complete an evaluation.  OT OCCUPATIONAL PROFILE AND HISTORY: Comprehensive assessment: Review of records and extensive additional review of physical, cognitive, psychosocial history related to current functional performance.  CLINICAL DECISION MAKING: High - multiple treatment options, significant modification of  task necessary  REHAB POTENTIAL: Fair    EVALUATION COMPLEXITY: High    PLAN: OT FREQUENCY: 2 x's a week  OT DURATION:12 weeks  PLANNED INTERVENTIONS: self care/ADL training, therapeutic exercise, therapeutic activity, neuromuscular re-education, manual therapy, passive range of motion, patient/family education, and cognitive remediation/compensation RECOMMENDED OTHER SERVICES: PT  CONSULTED AND AGREED WITH PLAN OF CARE: Patient and family member/caregiver  PLAN FOR NEXT SESSION: Initiate treatment  Olegario Messier, MS, OTR/L   12/28/22, 5:24 PM

## 2022-12-28 NOTE — Therapy (Signed)
OUTPATIENT PHYSICAL THERAPY TREATMENT    Patient Name: Paul Hall MRN: [id number] DOB:03/22/1996, 27 y.o., male Today's Date: 12/28/2022  PCP:  Cindi Carbon PROVIDER:  Lenise Herald, PA-C   PT End of Session - 12/28/22 2102     Visit Number 127    Number of Visits 146    Date for PT Re-Evaluation 02/22/23    Authorization Type [employer] COMM Pro/ [place] [employer]    Progress Note Due on Visit 130    PT Start Time 1530    PT Stop Time 1612    PT Time Calculation (min) 42 min    Equipment Utilized During Treatment Gait belt    Activity Tolerance Patient tolerated treatment well    Behavior During Therapy WFL for tasks assessed/performed                  Past Medical History:  Diagnosis Date   Diabetes mellitus (HCC)    Hypertension    Stroke (HCC)    Past Surgical History:  Procedure Laterality Date   IVC FILTER PLACEMENT ([facility] HX)     LEG SURGERY     PEG TUBE PLACEMENT     TRACHEOSTOMY     Patient Active Problem List   Diagnosis Date Noted   Sepsis due to vancomycin resistant Enterococcus species (HCC) 06/06/2019   SIRS (systemic inflammatory response syndrome) (HCC) 06/05/2019   Acute lower UTI 06/05/2019   VRE (vancomycin-resistant Enterococci) infection 06/05/2019   Anemia 06/05/2019   Skin ulcer of sacrum with necrosis of muscle (HCC)    Urinary retention    Type 2 diabetes mellitus without complication, with long-term current use of insulin (HCC)    Tachycardia    Lower extremity edema    Acute metabolic encephalopathy    Obstructive sleep apnea    Morbid obesity with BMI of 60.0-69.9, adult (HCC)    Goals of care, counseling/discussion    Palliative care encounter    Sepsis (HCC) 04/27/2019   H/O insulin dependent diabetes mellitus 04/27/2019   History of CVA with residual deficit 04/27/2019   Seizure disorder (HCC) 04/27/2019   Decubitus ulcer of sacral region, stage 4 (HCC) 04/27/2019    REFERRING DIAG: Cerebral infarction, unspecified    THERAPY DIAG:  Muscle weakness (generalized)  Other lack of coordination  History of CVA with residual deficit  Abnormality of gait and mobility  Difficulty in walking, not elsewhere classified  Unsteadiness on feet  Rationale for Evaluation and Treatment Rehabilitation  PERTINENT HISTORY: Paul Hall is a 26yoM who presents with severe weakness, quadriparesis, altered sensorium, and visual impairment s/p critical illness and prolonged hospitalization. Pt hospitalized in October 2020 with ARDS 2/2 COVID19 infection. Pt sustained a complex and lengthy hospitalization which included tracheostomy, prolonged sedation, ECMO. In this period pt sustained CVA and SDH. Pt has now been liberated from tracheostomy and G-tube. Pt has since been hospitalized for wound infection and UTI. Pt lives with parents at home, has hospital bed and left chair, hoyer lift transfers, and power WC for mobility needs. Pt needs heavy physical assistance with ADL 2/2 BUE contractures and motor dysfunction   PRECAUTIONS: Fall  SUBJECTIVE:  Patient reports continuing to do well. States proud of his performance last visit  PAIN:  Are you having pain? No   TODAY'S TREATMENT:  - 12/28/22   *patient dependently transferred from power w/c to mat table via Michiel Sites +2 assist (mother- [name]- assist PT)    Therex:    Forward lean (resistive with large green  band) 2 sets of 12 reps   Posterior lean (resistive with large green band) 2 sets of 12 reps         Therapeutic activities:   Sit to stand x multiple attempts 5+ with max A + 2 (PT and patient's mother) - Increased effort and more physical assist required to stand Static standing x multiple short bouts today < 30 sec due to patient with more fatigue and bilateral knee buckling. Patient able to rise but struggled to stand erect today due to undo fatigue.  Marland Kitchen    PATIENT EDUCATION: Education details: Progression of intensity at home with trunk strength  exercises and how to grade intensity using a goniometer bubble app.   HOME EXERCISE PROGRAM:  Access Code: [email or web address] URL: [email or web address] Date: 03/23/2022 Prepared by: Maureen Ralphs  Exercises - Supine Bridge  - 3 x weekly - 3 sets - 10 reps - 2 hold - Supine Gluteal Sets  - 3 x weekly - 3 sets - 10 reps - 5 sec hold - Supine Quad Set  - 1 x daily - 3 x weekly - 3 sets - 10 reps - 5 hold - Seated Long Arc Quad  - 1 x daily - 7 x weekly - 3 sets - 10 reps - Seated Hip Adduction Squeeze with Ball  - 1 x daily - 3 x weekly - 3 sets - 10 reps - 5 hold - Seated Hip Abduction  - 1 x daily - 3 x weekly - 3 sets - 10 reps - 2 hold    PT Short Term Goals -       PT SHORT TERM GOAL #1   Title Pt will be independent with HEP in order to improve strength and balance in order to decrease fall risk and improve function at home and work.    Baseline 01/04/2021= No formal HEP in place; 12/12 no HEP in place; 05/10/2021-Patient and his father were able to report compliance with curent HEP consisting of mostly seated/reclined LE strengthening. Both verbalize no questions at this time.    Time 6    Period Weeks    Status Achieved    Target Date 02/15/21            PT LONG TERM GOAL #1     Title Patient will increase BLE gross strength by 1/2 muscle grade to improve functional strength for improved independence with potential gait, increased standing tolerance and increased ADL ability.     Baseline 01/04/2021- Patient presents with 1/5 to 3-/5 B LE strength with MMT; 12/12: goal partially met for Left knee/hip; 05/10/2021= 2-/5 bilateal Hip flex; 3+/5 bilateral Knee ext; 06/21/2021= Patient presents with 2-/5 bilateral Hip flex; 3+/5 bilateral knee ext/flex; 2-/5 left ankle DF; 0/5 right ankle- and able to increase reps and resistance with LE's. 09/15/2021- Patient technically presents with 2-/5 B hip flex/abd/add - but he is able to raise his hip up to approx 100 deg which has  improved. 3+/5 Bilateral knee ext, 2-/5 left ankle and 0/5 right ankle.  12/08/2021= Patient able to lift left knee at 110 deg of hip flex; presents with 3+/5 knee ext, 2-/5 left ankle DF and 0/5 right ankle DF, 2-/5 bilateral Hip abd in seated position.    12/6: R: knee 3+/5 ext, 2/5 flexion, left knee 3+/5 extension, 3+/5 flexion, R hip: 2+/5 hip add, 2+/5 hip ABD L hip: 4-/5 hip ABD, 3+/5 hip ADD, 3+/5 hip flexion; 06/06/2022= Patient now presents with 2-/5 right ankle DF/PF;  Time 12     Period Weeks     Status MET    Target Date 03/07/2022         PT LONG TERM GOAL #2    Title Patient will tolerate sitting unsupported demonstrating erect sitting posture for 15 minutes with CGA to demonstrate improved back extensor strength and improved sitting tolerance.     Baseline 01/04/2021- Patient confied to sitting in lift chair or electric power chair with back support and unable to sit upright without physical assistance; 12/12: tolerates <1 minutes upright unsupported sitting. 05/10/2021=static sit with forward trunk lean  in his power wheelchair without back support x approx 3 min. 06/21/2021=Unable to assess today due to patient with acute back pain but on previous visit able to sit x 8 min without back support. 09/15/2021- on last visit- 09/13/2021- patient was able to sit unsupported x 8 min at edge of mat. 10/13/2023 - Patient was able to sit at edge of mat with varying level of assist today from SBA to min A for a total of 20 min. 12/13/2021= Patient demonstrated unsupported sitting at edge of mat for approx 20 min    Time 12     Period Weeks     Status GOAL MET    Target Date 12/08/21          PT LONG TERM GOAL #3    Title Patient will demonstrate ability to perform static standing in // bars > 2 min with Max Assist  without loss of balance and fair posture for improved overall strength for pre-gait and transfer activities.     Baseline 01/04/2021= Patient current uanble to stand- Dependent on hoyer or  sit to stand lift for transfers. 05/10/2021=Not appropriate yet- Currently still dependent with all transfers using hoyer. 06/21/2021= Patient continuing now to focus on LE strengthening to prepare for standing-unable to try today due to acute low back pain-  planning on attempting in new cert period. 09/15/2021- Patient has attempted standing 2x in past two week- max Assist of 2 people - only once was he successful to clearing his bottom from chair - Will continue to be a focus during the new certification. 12/13/2021= Patient has been limited secondary to increased overall low back pain during this certification and will require more time to focus on this goal.  12/6: not assessed this date, will assess at date when 2-3 PTs are present for assistance     Time 12     Period Weeks     Status GOAL not appropriate at this time - may attempt in future once Patient presents with improved overall LE strength.                 PT LONG TERM GOAL #4    Title Pt will improve FOTO score by 10 points or more demonstrating improved perceived functional ability     Baseline FOTO 7 on 10/17; 03/15/21: FOTO 12; 05/10/2021 06/21/2021= 1; 09/15/2021= 9; 12/13/2021= Will issue next visit 12/6: 4; 06/06/2022= Will assess next visit; Goal not appropriate as patient is not ambulatory.     Time 12     Period Weeks     Status WITHDRAWN    Target date 11/30/2022         PT LONG TERM GOAL #5    Title Patient will perform sit to stand transfer with appropriate AD and max assist of 2 people with 75% consistency to prepare for pregait activities.     Baseline 09/15/2021=  Patient unable to stand well- unable to clear his bottom off chair with Max assist of 2 persons. 12/13/2021- Goal not appropriate to try yet but will keep and roll over to next cert as shift continues to focus on transfers/standing; 4/24= Patient able to perform active ankle DF/PF with right LE and able to raise his knee into seated march and clear floor without physical  assist today - as previously unable as well as lift right knee ext to near full ROM to improve strength for eventual standing. 09/07/2022- Goal continues to not be appropriate but patient is now standing some and will keep goal active; 11/23/2022= patient is now performing sit to stand with max A +2 and now able to clear his bottom off mat. 11/30/2022- Patient continues to perform sit to stand transfers with Max A+2 and able to clear bottom well off mat but not erect yet-     Time 12     Period Weeks     Status PROGRESSING    Target date  02/22/2023       PT LONG TERM GOAL #6  Title Patient will tolerate sitting unsupported demonstrating erect sitting posture for 30 minutes with CGA to demonstrate improved back extensor strength and improved sitting tolerance.   Baseline 12/13/2021= Patient demonstrated unsupported sitting at edge of mat for approx 20 min;  12/29/2021- Patient performed approx 30 min of dynamic sitting activities today. 06/06/2022= Patient demonstrated ability to sit and perform static and dynamic UE/LE movement with only Supervision.   Time 12   Period Weeks   Status GOAL MET           7.  Patient will tolerate 2 minutes or more of standing in sit to stand lift or with max assist +2 in order to indicate improved lower extremity weightbearing tolerance for progression to standing in parallel bars. Baseline: 1 minute on most recent stand 02/21/22; 06/06/2022- Patient did attempt today after complaining of right LE pain last week. He attempted 3 stands using sit to stand lift. - all over 1 min- last one approx 48 sec- stopped due to fatigue. More erect standing in lift today - still poor gluteal strength but able to activate glutes and extend hips upon command but unable to hold > 5 sec. 07/28/2022- Will attempt standing next visit but patient has been able to stand since last progress note for up to 3 min at a time at his best. 09/07/2022-  attempted standing with max +2 and patient able to  stand 15-20 sec so will now  revise goal; 11/23/2022- Patient at his best between last 2 visits was able to stand approx 50 sec with max A+2.  11/30/2022- Patient performed 4 trials of static standing - max A +2- clearing bottom and using forearms to bear weight  Goal status: Revised Target date: 02/22/2023   8. Patient will tolerate sitting unsupported demonstrating ability to perform dynamic UE/LE activities for 30 minutes  independently to demonstrate ability to sit at edge of bed to eat or perform some ADL's/exercise for optimal quality of life.             Baseline: 06/06/2022- Patient able to sit and perform some UE/LE exercises but requires CGA at times for safety- he is able to static sit for 30 min with supervision. 07/27/2022= Patient able to now sit > 30 min with Supervision only - performing static and dynamic activities. Will keep goal active to ensure patient is consistent. 09/07/2022=  Patient able  to demonstrate consistent ability to sit at edge of mat during visits             Goal Status: MET             Target date: 08/29/2022        Plan     Clinical Impression Statement  Patient presented with good motivation today. He was more limited with increased difficulty standing and remaining in standing due to weakness/fatigue. He was able to work more on transfers but required more physical assist overall.   Pt will continue to benefit from skilled physical therapy intervention to address impairments, improve QOL, and attain therapy goals.    Personal Factors and Comorbidities Comorbidity 3+;Time since onset of injury/illness/exacerbation    Comorbidities CVA, diabetes, Seizures    Examination-Activity Limitations Bathing;Bed Mobility;Bend;Caring for Others;Carry;Dressing;Hygiene/Grooming;Lift;Locomotion Level;Reach Overhead;Self Feeding;Sit;Squat;Stairs;Stand;Transfers;Toileting    Examination-Participation Restrictions Cleaning;Community Activity;Driving;Laundry;Medication Management;Meal  Prep;Occupation;Personal Finances;Shop;Yard Work;Volunteer    Stability/Clinical Decision Making Evolving/Moderate complexity    Rehab Potential Fair    PT Frequency 2x / week    PT Duration 12 weeks    PT Treatment/Interventions ADLs/Self Care Home Management;Cryotherapy;Electrical Stimulation;Moist Heat;Ultrasound;DME Instruction;Gait training;Stair training;Functional mobility training;Therapeutic exercise;Balance training;Patient/family education;Orthotic Fit/Training;Neuromuscular re-education;Wheelchair mobility training;Manual techniques;Passive range of motion;Dry needling;Energy conservation;Taping;Visual/perceptual remediation/compensation;Joint Manipulations    PT Next Visit Plan core strength/motor control in short-sitting, sit to stand if able; Continue with progressive LE Strengthening. Stand activities as appropriate.     PT Home Exercise Plan No changes to HEP today    Consulted and Agree with Plan of Care Patient;Family member/caregiver    Family Member Consulted            9:12 PM, 12/28/22 Louis Meckel, PT Physical Therapist - [facility]  Outpatient Physical Therapy- [facility] [phone]      12/28/2022, 9:12 PM

## 2023-01-02 ENCOUNTER — Ambulatory Visit: Payer: Medicare Other

## 2023-01-02 ENCOUNTER — Ambulatory Visit: Payer: BC Managed Care – PPO | Admitting: Occupational Therapy

## 2023-01-04 ENCOUNTER — Ambulatory Visit: Payer: BC Managed Care – PPO | Admitting: Occupational Therapy

## 2023-01-04 ENCOUNTER — Ambulatory Visit: Payer: BC Managed Care – PPO

## 2023-01-09 ENCOUNTER — Ambulatory Visit: Payer: BC Managed Care – PPO | Attending: Physician Assistant

## 2023-01-09 ENCOUNTER — Ambulatory Visit: Payer: BC Managed Care – PPO | Admitting: Occupational Therapy

## 2023-01-09 DIAGNOSIS — I6930 Unspecified sequelae of cerebral infarction: Secondary | ICD-10-CM | POA: Insufficient documentation

## 2023-01-09 DIAGNOSIS — M6281 Muscle weakness (generalized): Secondary | ICD-10-CM | POA: Diagnosis not present

## 2023-01-09 DIAGNOSIS — R2681 Unsteadiness on feet: Secondary | ICD-10-CM | POA: Diagnosis not present

## 2023-01-09 DIAGNOSIS — R262 Difficulty in walking, not elsewhere classified: Secondary | ICD-10-CM | POA: Insufficient documentation

## 2023-01-09 DIAGNOSIS — R269 Unspecified abnormalities of gait and mobility: Secondary | ICD-10-CM | POA: Insufficient documentation

## 2023-01-09 DIAGNOSIS — R278 Other lack of coordination: Secondary | ICD-10-CM | POA: Diagnosis not present

## 2023-01-09 NOTE — Therapy (Signed)
OUTPATIENT PHYSICAL THERAPY TREATMENT    Patient Name: Paul Hall MRN: [id number] DOB:13-Aug-1995, 27 y.o., male Today's Date: 01/10/2023  PCP:  Cindi Carbon PROVIDER:  Lenise Herald, PA-C   PT End of Session - 01/09/23 2137     Visit Number 128    Number of Visits 146    Date for PT Re-Evaluation 02/22/23    Authorization Type [employer]BS COMM [employer]/ [place] [employer]    Progress Note Due on Visit 130    PT Start Time 1536    PT Stop Time 1616    PT Time Calculation (min) 40 min    Equipment Utilized During Treatment Gait belt    Activity Tolerance Patient tolerated treatment well    Behavior During Therapy WFL for tasks assessed/performed                   Past Medical History:  Diagnosis Date   Diabetes mellitus (HCC)    Hypertension    Stroke (HCC)    Past Surgical History:  Procedure Laterality Date   IVC FILTER PLACEMENT ([facility] HX)     LEG SURGERY     PEG TUBE PLACEMENT     TRACHEOSTOMY     Patient Active Problem List   Diagnosis Date Noted   Sepsis due to vancomycin resistant Enterococcus species (HCC) 06/06/2019   SIRS (systemic inflammatory response syndrome) (HCC) 06/05/2019   Acute lower UTI 06/05/2019   VRE (vancomycin-resistant Enterococci) infection 06/05/2019   Anemia 06/05/2019   Skin ulcer of sacrum with necrosis of muscle (HCC)    Urinary retention    Type 2 diabetes mellitus without complication, with long-term current use of insulin (HCC)    Tachycardia    Lower extremity edema    Acute metabolic encephalopathy    Obstructive sleep apnea    Morbid obesity with BMI of 60.0-69.9, adult (HCC)    Goals of care, counseling/discussion    Palliative care encounter    Sepsis (HCC) 04/27/2019   H/O insulin dependent diabetes mellitus 04/27/2019   History of CVA with residual deficit 04/27/2019   Seizure disorder (HCC) 04/27/2019   Decubitus ulcer of sacral region, stage 4 (HCC) 04/27/2019    REFERRING DIAG: Cerebral infarction, unspecified    THERAPY DIAG:  Muscle weakness (generalized)  Other lack of coordination  History of CVA with residual deficit  Abnormality of gait and mobility  Difficulty in walking, not elsewhere classified  Unsteadiness on feet  Rationale for Evaluation and Treatment Rehabilitation  PERTINENT HISTORY: Paul Hall is a 26yoM who presents with severe weakness, quadriparesis, altered sensorium, and visual impairment s/p critical illness and prolonged hospitalization. Pt hospitalized in October 2020 with ARDS 2/2 COVID19 infection. Pt sustained a complex and lengthy hospitalization which included tracheostomy, prolonged sedation, ECMO. In this period pt sustained CVA and SDH. Pt has now been liberated from tracheostomy and G-tube. Pt has since been hospitalized for wound infection and UTI. Pt lives with parents at home, has hospital bed and left chair, hoyer lift transfers, and power WC for mobility needs. Pt needs heavy physical assistance with ADL 2/2 BUE contractures and motor dysfunction   PRECAUTIONS: Fall  SUBJECTIVE:  Patient reports having a good weekend and states he was able to get out with his family and go to [facility] [employer] and had a blast.   PAIN:  Are you having pain? No   TODAY'S TREATMENT:  - 01/09/2023   *patient dependently transferred from power w/c to mat table via Michiel Sites +2 assist (mother- [name]-  assist PT)    Therex:    Anterior/posterior weight shift/lean x 20 reps- VC to lean further forward to prepare for sit to stands.    Dynamic reaching cross body x 20 reps each side (min assist R side)   Seated hip march x 15 reps   Seated knee ext x 15 reps  Seated ham curl (using flat disc for gliding heel on floor)       Therapeutic activities:   Sit to stand - max A + 2 (PT and patient's mother) - focusing on patient leading count and really leaning forward.   Static standing with Max A +2- x multiple trials- Patient continues to only minimally use his arm for support- able  to stand more erect on 1st trial with more gluteal and quad activation. Other standing limited by overall weakness with some knee buckling.    PATIENT EDUCATION: Education details: Progression of intensity at home with trunk strength exercises and how to grade intensity using a goniometer bubble app.   HOME EXERCISE PROGRAM:  Access Code: [email or web address] URL: [email or web address] Date: 03/23/2022 Prepared by: Maureen Ralphs  Exercises - Supine Bridge  - 3 x weekly - 3 sets - 10 reps - 2 hold - Supine Gluteal Sets  - 3 x weekly - 3 sets - 10 reps - 5 sec hold - Supine Quad Set  - 1 x daily - 3 x weekly - 3 sets - 10 reps - 5 hold - Seated Long Arc Quad  - 1 x daily - 7 x weekly - 3 sets - 10 reps - Seated Hip Adduction Squeeze with Ball  - 1 x daily - 3 x weekly - 3 sets - 10 reps - 5 hold - Seated Hip Abduction  - 1 x daily - 3 x weekly - 3 sets - 10 reps - 2 hold    PT Short Term Goals -       PT SHORT TERM GOAL #1   Title Pt will be independent with HEP in order to improve strength and balance in order to decrease fall risk and improve function at home and work.    Baseline 01/04/2021= No formal HEP in place; 12/12 no HEP in place; 05/10/2021-Patient and his father were able to report compliance with curent HEP consisting of mostly seated/reclined LE strengthening. Both verbalize no questions at this time.    Time 6    Period Weeks    Status Achieved    Target Date 02/15/21            PT LONG TERM GOAL #1     Title Patient will increase BLE gross strength by 1/2 muscle grade to improve functional strength for improved independence with potential gait, increased standing tolerance and increased ADL ability.     Baseline 01/04/2021- Patient presents with 1/5 to 3-/5 B LE strength with MMT; 12/12: goal partially met for Left knee/hip; 05/10/2021= 2-/5 bilateal Hip flex; 3+/5 bilateral Knee ext; 06/21/2021= Patient presents with 2-/5 bilateral Hip flex; 3+/5 bilateral knee  ext/flex; 2-/5 left ankle DF; 0/5 right ankle- and able to increase reps and resistance with LE's. 09/15/2021- Patient technically presents with 2-/5 B hip flex/abd/add - but he is able to raise his hip up to approx 100 deg which has improved. 3+/5 Bilateral knee ext, 2-/5 left ankle and 0/5 right ankle.  12/08/2021= Patient able to lift left knee at 110 deg of hip flex; presents with 3+/5 knee ext, 2-/5 left ankle DF and 0/5 right  ankle DF, 2-/5 bilateral Hip abd in seated position.    12/6: R: knee 3+/5 ext, 2/5 flexion, left knee 3+/5 extension, 3+/5 flexion, R hip: 2+/5 hip add, 2+/5 hip ABD L hip: 4-/5 hip ABD, 3+/5 hip ADD, 3+/5 hip flexion; 06/06/2022= Patient now presents with 2-/5 right ankle DF/PF;     Time 12     Period Weeks     Status MET    Target Date 03/07/2022         PT LONG TERM GOAL #2    Title Patient will tolerate sitting unsupported demonstrating erect sitting posture for 15 minutes with CGA to demonstrate improved back extensor strength and improved sitting tolerance.     Baseline 01/04/2021- Patient confied to sitting in lift chair or electric power chair with back support and unable to sit upright without physical assistance; 12/12: tolerates <1 minutes upright unsupported sitting. 05/10/2021=static sit with forward trunk lean  in his power wheelchair without back support x approx 3 min. 06/21/2021=Unable to assess today due to patient with acute back pain but on previous visit able to sit x 8 min without back support. 09/15/2021- on last visit- 09/13/2021- patient was able to sit unsupported x 8 min at edge of mat. 10/13/2023 - Patient was able to sit at edge of mat with varying level of assist today from SBA to min A for a total of 20 min. 12/13/2021= Patient demonstrated unsupported sitting at edge of mat for approx 20 min    Time 12     Period Weeks     Status GOAL MET    Target Date 12/08/21          PT LONG TERM GOAL #3    Title Patient will demonstrate ability to perform static  standing in // bars > 2 min with Max Assist  without loss of balance and fair posture for improved overall strength for pre-gait and transfer activities.     Baseline 01/04/2021= Patient current uanble to stand- Dependent on hoyer or sit to stand lift for transfers. 05/10/2021=Not appropriate yet- Currently still dependent with all transfers using hoyer. 06/21/2021= Patient continuing now to focus on LE strengthening to prepare for standing-unable to try today due to acute low back pain-  planning on attempting in new cert period. 09/15/2021- Patient has attempted standing 2x in past two week- max Assist of 2 people - only once was he successful to clearing his bottom from chair - Will continue to be a focus during the new certification. 12/13/2021= Patient has been limited secondary to increased overall low back pain during this certification and will require more time to focus on this goal.  12/6: not assessed this date, will assess at date when 2-3 PTs are present for assistance     Time 12     Period Weeks     Status GOAL not appropriate at this time - may attempt in future once Patient presents with improved overall LE strength.                 PT LONG TERM GOAL #4    Title Pt will improve FOTO score by 10 points or more demonstrating improved perceived functional ability     Baseline FOTO 7 on 10/17; 03/15/21: FOTO 12; 05/10/2021 06/21/2021= 1; 09/15/2021= 9; 12/13/2021= Will issue next visit 12/6: 4; 06/06/2022= Will assess next visit; Goal not appropriate as patient is not ambulatory.     Time 12     Period Weeks  Status WITHDRAWN    Target date 11/30/2022         PT LONG TERM GOAL #5    Title Patient will perform sit to stand transfer with appropriate AD and max assist of 2 people with 75% consistency to prepare for pregait activities.     Baseline 09/15/2021= Patient unable to stand well- unable to clear his bottom off chair with Max assist of 2 persons. 12/13/2021- Goal not appropriate to try yet but  will keep and roll over to next cert as shift continues to focus on transfers/standing; 4/24= Patient able to perform active ankle DF/PF with right LE and able to raise his knee into seated march and clear floor without physical assist today - as previously unable as well as lift right knee ext to near full ROM to improve strength for eventual standing. 09/07/2022- Goal continues to not be appropriate but patient is now standing some and will keep goal active; 11/23/2022= patient is now performing sit to stand with max A +2 and now able to clear his bottom off mat. 11/30/2022- Patient continues to perform sit to stand transfers with Max A+2 and able to clear bottom well off mat but not erect yet-     Time 12     Period Weeks     Status PROGRESSING    Target date  02/22/2023       PT LONG TERM GOAL #6  Title Patient will tolerate sitting unsupported demonstrating erect sitting posture for 30 minutes with CGA to demonstrate improved back extensor strength and improved sitting tolerance.   Baseline 12/13/2021= Patient demonstrated unsupported sitting at edge of mat for approx 20 min;  12/29/2021- Patient performed approx 30 min of dynamic sitting activities today. 06/06/2022= Patient demonstrated ability to sit and perform static and dynamic UE/LE movement with only Supervision.   Time 12   Period Weeks   Status GOAL MET           7.  Patient will tolerate 2 minutes or more of standing in sit to stand lift or with max assist +2 in order to indicate improved lower extremity weightbearing tolerance for progression to standing in parallel bars. Baseline: 1 minute on most recent stand 02/21/22; 06/06/2022- Patient did attempt today after complaining of right LE pain last week. He attempted 3 stands using sit to stand lift. - all over 1 min- last one approx 48 sec- stopped due to fatigue. More erect standing in lift today - still poor gluteal strength but able to activate glutes and extend hips upon command but  unable to hold > 5 sec. 07/28/2022- Will attempt standing next visit but patient has been able to stand since last progress note for up to 3 min at a time at his best. 09/07/2022-  attempted standing with max +2 and patient able to stand 15-20 sec so will now  revise goal; 11/23/2022- Patient at his best between last 2 visits was able to stand approx 50 sec with max A+2.  11/30/2022- Patient performed 4 trials of static standing - max A +2- clearing bottom and using forearms to bear weight  Goal status: Revised Target date: 02/22/2023   8. Patient will tolerate sitting unsupported demonstrating ability to perform dynamic UE/LE activities for 30 minutes  independently to demonstrate ability to sit at edge of bed to eat or perform some ADL's/exercise for optimal quality of life.             Baseline: 06/06/2022- Patient able to  sit and perform some UE/LE exercises but requires CGA at times for safety- he is able to static sit for 30 min with supervision. 07/27/2022= Patient able to now sit > 30 min with Supervision only - performing static and dynamic activities. Will keep goal active to ensure patient is consistent. 09/07/2022=  Patient able to demonstrate consistent ability to sit at edge of mat during visits             Goal Status: MET             Target date: 08/29/2022        Plan     Clinical Impression Statement  Patient was able to present with a good initial stand- able to engage gluteals more than previous sessions and stand more erect. On subsequent trials- he fatigued more quickly with more knee buckling.  Pt will continue to benefit from skilled physical therapy intervention to address impairments, improve QOL, and attain therapy goals.    Personal Factors and Comorbidities Comorbidity 3+;Time since onset of injury/illness/exacerbation    Comorbidities CVA, diabetes, Seizures    Examination-Activity Limitations Bathing;Bed Mobility;Bend;Caring for  Others;Carry;Dressing;Hygiene/Grooming;Lift;Locomotion Level;Reach Overhead;Self Feeding;Sit;Squat;Stairs;Stand;Transfers;Toileting    Examination-Participation Restrictions Cleaning;Community Activity;Driving;Laundry;Medication Management;Meal Prep;Occupation;Personal Finances;Shop;Yard Work;Volunteer    Stability/Clinical Decision Making Evolving/Moderate complexity    Rehab Potential Fair    PT Frequency 2x / week    PT Duration 12 weeks    PT Treatment/Interventions ADLs/Self Care Home Management;Cryotherapy;Electrical Stimulation;Moist Heat;Ultrasound;DME Instruction;Gait training;Stair training;Functional mobility training;Therapeutic exercise;Balance training;Patient/family education;Orthotic Fit/Training;Neuromuscular re-education;Wheelchair mobility training;Manual techniques;Passive range of motion;Dry needling;Energy conservation;Taping;Visual/perceptual remediation/compensation;Joint Manipulations    PT Next Visit Plan core strength/motor control in short-sitting, sit to stand if able; Continue with progressive LE Strengthening. Stand activities as appropriate.     PT Home Exercise Plan No changes to HEP today    Consulted and Agree with Plan of Care Patient;Family member/caregiver    Family Member Consulted            11:33 AM, 01/10/23 Louis Meckel, PT Physical Therapist - [facility]  Outpatient Physical Therapy- [facility] [phone]      01/10/2023, 11:33 AM

## 2023-01-09 NOTE — Therapy (Signed)
Occupational Therapy Progress Note  Dates of reporting period  11/16/2022   to   01/09/2023   Patient Name: Paul Hall MRN: [id number] DOB:[birth year], 27 y.o., male Today's Date: 01/09/2023  PCP: Dr. Sherwood Gambler REFERRING PROVIDER: Dr. Sherwood Gambler   OT End of Session - 01/09/23 1722     Visit Number 60    Number of Visits 84    Date for OT Re-Evaluation 02/08/23    OT Start Time 1620    OT Stop Time 1700    OT Time Calculation (min) 40 min    Activity Tolerance Patient tolerated treatment well    Behavior During Therapy WFL for tasks assessed/performed                  Past Medical History:  Diagnosis Date   Diabetes mellitus (HCC)    Hypertension    Stroke [facility])    Past Surgical History:  Procedure Laterality Date   IVC FILTER PLACEMENT ([facility] HX)     LEG SURGERY     PEG TUBE PLACEMENT     TRACHEOSTOMY     Patient Active Problem List   Diagnosis Date Noted   Sepsis due to vancomycin resistant Enterococcus species (HCC) 06/06/2019   SIRS (systemic inflammatory response syndrome) (HCC) 06/05/2019   Acute lower UTI 06/05/2019   VRE (vancomycin-resistant Enterococci) infection 06/05/2019   Anemia 06/05/2019   Skin ulcer of sacrum with necrosis of muscle (HCC)    Urinary retention    Type 2 diabetes mellitus without complication, with long-term current use of insulin (HCC)    Tachycardia    Lower extremity edema    Acute metabolic encephalopathy    Obstructive sleep apnea    Morbid obesity with BMI of 60.0-69.9, adult (HCC)    Goals of care, counseling/discussion    Palliative care encounter    Sepsis (HCC) 04/27/2019   H/O insulin dependent diabetes mellitus 04/27/2019   History of CVA with residual deficit 04/27/2019   Seizure disorder (HCC) 04/27/2019   Decubitus ulcer of sacral region, stage 4 (HCC) 04/27/2019   ONSET DATE: 01/2019  REFERRING DIAG: CVA/COVID-19  THERAPY DIAG:  Muscle weakness (generalized)  Rationale for Evaluation and Treatment  Rehabilitation  SUBJECTIVE:   SUBJECTIVE STATEMENT:  Pt. reports doing well today.  Pt accompanied by: self and family member  PERTINENT HISTORY:  Pt. is a 27 y.o. male who was diagnosed with COVID-19, and CVA with resultant quadriplegia. Pt. Was then hospitalized with VRE UTI. PMHx includes: urinary retention, seizure disorder, obstructive sleep disorder, DM Type II, Morbid obesity.   PRECAUTIONS: None  WEIGHT BEARING RESTRICTIONS No  PAIN:  Are you having pain? No reports of pain  FALLS: Has patient fallen in last 6 months? No  LIVING ENVIRONMENT: Lives with: lives with their family Lives in: House/apartment Stairs: No Level Entry Has following equipment at home: Wheelchair (power) and hoyer lift, sit to stand lift  PLOF: Independent  PATIENT GOALS: To be able to engage in more daily care tasks.  OBJECTIVE:   HAND DOMINANCE: Right  ADLs:  Transfers/ambulation related to ADLs: Eating: Pt. reports being able to hold standard utensils, and is starting to engage more in self-feeding tasks, hand to mouth patterns. Pt. Reports that he does as much as he can with the task, and family assists  with the remainder of the task. Grooming: Pt. Is able to initiate holding an electric toothbrush, and brush his teeth. Family assists LB Dressing: Total Assist UE dressing: Pt. is now able  to reach up to actively assist with grasping , and pulling his gown down. Toileting:  Total Assist Bathing: MaxA UB, Total assist LB Tub Shower transfers: N/A Equipment: See above    IADLs: Shopping: Relies on family to assist Light housekeeping: Total Assist Meal Prep: Total Assist Community mobility:   Medication management:  Total Assist  Financial management: N/A Handwriting: Not legible: Pt. Is able to hold a pen with the left hand, and initiate marking the page. Pt.'s eye glasses were not available  MOBILITY STATUS:  Power w/c  POSTURE COMMENTS:  Pt. Requires position changes in  his power w/c  ACTIVITY TOLERANCE: Activity tolerance:  Fair  FUNCTIONAL OUTCOME MEASURES: FOTO: 40  TR score: 45  05/25/2022:   FOTO: 50 TR score: 45  07/20/22: FOTO 55  UPPER EXTREMITY ROM     Active ROM Left eval Left  03/21/2022 Left 05/25/2022 L  07/20/22 Left 07/25/2022 Left  08/15/2022 Left  09/26/2022 Left  11/16/2022 Left 12/28/2022  Shoulder flexion 119 123 125 130  136 138 130 142  Shoulder abduction 110 115 115 115  118 121 125 142  Shoulder adduction           Shoulder extension           Shoulder internal rotation           Shoulder external rotation           Elbow flexion 135(135) 145 145 145  145     Elbow extension -27(-18) -21(-20) -20(-16)  -25(-10) -23(-10) -21(-10) -20(-18) -20(-18)  Wrist flexion           Wrist extension 10(50) 19(50) 28(50)  30(50) 35(55) 36(55) 36(60) 40(60)  Wrist ulnar deviation           Wrist radial deviation           Wrist pronation           Wrist supination   Limited by flexor tone        (Blank rows = not tested)    Active ROM Right eval Right 03/21/2022 Right 05/25/2022 R  07/20/22 Right  07/25/22 Right 08/15/2022 Right 09/26/2022 Right 11/16/2022 Right 12/28/2022  Shoulder flexion 106 scaption 105  Scaption 62 flexion 110 scaption 100  105 105 105 112 Scaption  Shoulder abduction 114 90 97 100  105 117 120 124  Shoulder adduction           Shoulder extension           Shoulder internal rotation           Shoulder external rotation           Elbow flexion 120(130) 145 145 140  144 140 142 148  Elbow extension -45(-35) -22(-35) -28(-22)  -32(-21) -32(-28) -28(-26) -28(-28) -27(-26)  Wrist flexion           Wrist extension -30(10) -25(10) -10(30)  -10(20) -10(25) -20(40) -30(10) -22(34)  Wrist ulnar deviation           Wrist radial deviation           Wrist pronation           Wrist supination   Limited by flexor tone          12/28/2022: Thumb radial abduction: Right: 12(46), Left: 40(54)  Digits: Bilateral  2nd through 5th digit: MP Hyperextension with PIP, an DIP flexion     UPPER EXTREMITY MMT:     Left eval Left 03/21/2022 Left  05/25/2022 L 07/20/22 Left  08/15/2022 Left 09/26/2022 Left 11/16/2022 Left 12/28/2022  Shoulder flexion 3-/5 3/5 3+/5 4+/5 4+/5 4+/5 4+/5 4+/5  Shoulder abduction 3-/5 3-/5 4-/5 4+/5 4+/5 4+5 4+/5 4+/5  Shoulder adduction          Shoulder extension          Shoulder internal rotation          Shoulder external rotation          Elbow flexion 3/5 3+/5 4-/5 4+/5 5/5 5/5 5/5 5/5  Elbow extension 2-/5 2/5  4+/5 4+/5 4+/5  4/5  Wrist flexion          Wrist extension 2/5 2+/5 3-/5  3-/5 3-/5 3-/5 3/5  Wrist ulnar deviation          Wrist radial deviation          Wrist pronation          Wrist supination          (Blank rows = not tested)  Right eval Right 03/21/2022 Right  05/25/2022 R 07/20/22 Right 08/15/2022 Right 09/26/2022 Right 11/16/2022 Right 12/28/2022  Shoulder flexion 3-/5 scaption 3-/5 3-/5 3+/5 3+/5 3+/5 3/5 3/5  Shoulder abduction 3-/5 3-/5 3-/5 4/5 4/5 4/5 4/5 4/5  Shoulder adduction          Shoulder extension          Shoulder internal rotation          Shoulder external rotation          Elbow flexion 3/5 3/5 3+/5 4/5 4+/5 4+/5 4+/5 4+/5  Elbow extension 2-/5 2/5 3-/5 4+/5 4+/5 4+/5 4+/5 4-/5  Wrist flexion          Wrist extension 2-/5 2-/5 2/5 2/5 2/5 2/5 2/5 2/5  Wrist ulnar deviation          Wrist radial deviation          Wrist pronation          Wrist supination            HAND FUNCTION:  Grip strength: Right: 0 lbs; Left: 0 lbs and Lateral pinch: Right: 5 lbs, Left: 2 lbs  03/21/2022: Lateral pinch: Right: 3.5 lbs, Left: 2 lbs  07/20/22: Grip strength: Right: 3 lbs; Left: 4 lbs and Lateral pinch: Right: 5 lbs, Left: 3 lbs  08/15/2022:  Grip strength: Right: 0 lbs; Left: 5 lbs and Lateral pinch: Right: 2 lbs, Left: 4 lbs  09/26/2022  Grip strength: Right: 0 lbs; Left: 8 lbs and Lateral pinch: Right: 2 lbs, Left: 5  lbs  11/16/2022  Grip strength: Right: 0 lbs; Left: 8 lbs  9/25/20204  Grip strength: Right: 0 lbs; Left: 8 lbs    Bilateral digit PIP/DIP flexion contractures with MP hyperextension with attempts for AROM. Pt. is able to tolerate AROM to the bilateral digits at the initial evaluation however, has a history of pain in the digits.  COORDINATION: Eval: Pt. is unable to grasp 9-hole test pegs. Pt. is able to initiate grasping larger pegs, and is able to hold a pen in the left hand.  07/20/22: 2 min 36 seconds to remove 9 pegs from 9 hole peg test - cues to locate pegs 2/2 low vision. Pt. is able to initiate grasping larger pegs on R hand and is able to hold a pen in the left hand.  08/15/2022:      Vision, and sensation limiting accuracy of 9 hole peg test results. Pt.  Was able to grasp and remove vertical pegs intermittently with cues.    SENSATION: Light touch: Impaired   EDEMA:  N/A  MUSCLE TONE: BUE flexor Spasticity  COGNITION Overall cognitive status: Continue to assess in functional context  VISION:   Subjective report: Pt. was not wearing glasses at the time of the initial eval.  Baseline vision: Vision is very limited. Wears glasses all the time Visual history: History of impaired vision following CVA. Pt. Has received treatment through the [facility] low vision rehabilitation program.   VISION ASSESSMENT: Impaired To be further assessed in functional context  PERCEPTION: Impaired   PRAXIS: Impaired: motor planning  OBSERVATIONS:  Pt reports being on Tramadol   TODAY'S TREATMENT   Therapeutic Ex.:  Pt. performed AROM for right shoulder flexion, abduction. Emphasis was placed on slow prolonged gentle passive stretching for RUE, forearm supination, bilateral wrist extension, wrist extension, digit MP, PIP, and DIP extension following moist heat. ROM was performed to decrease stiffness, and prepare the RUE for functional use. ROM was performed following moist heat to  the shoulder right elbow, forearm, wrist, and digits. PROM for Left digit MP, PIP, and DIP extension. Pt. worked on pinch strengthening in the left hand for lateral, and 3pt. pinch using yellow, red, green, and blue resistive clips. Pt. worked on placing the clips at various vertical and horizontal angles. Tactile and verbal cues were required for eliciting the desired movement.  Pt. required assist to position the clips in the left hand.  PATIENT EDUCATION: Education details: Doctor, general practice and fork positioning for self feeding Person educated: Patient and Parent Education method: Explanation, Demonstration, Tactile cues, and Verbal cues Education comprehension: verbalized understanding, returned demonstration, verbal cues required, and needs further education  HOME EXERCISE PROGRAM:  Continue ongoing assessment, and continue to provide as needed.   GOALS: Goals reviewed with patient? Yes  SHORT TERM GOALS: Target date: 11/07/2022   To assess splint fit, and make appropriate adjustments to promote good skin integrity through the palmar surface of the bilateral hands.  Baseline: 05/25/22: Goal currently met, however ongoing as needs to assess splint fit arise. 03/23/2022: Pt. is wearing splints a couple of hours at night bilateral resting hand splints. 03/21/2022: Pt. is wearing splints a couple of hours at night bilateral resting hand splints. Goal status: Deferred   LONG TERM GOALS: Target date: 02/08/2023    FOTO score will Improve by 2 points for Pt. perceived improvement with the assessment specific ADL/IADL tasks.  Baseline: 12/28/2022: FOTO score: 56; 11/16/2022: 52 09/27/2022: 51 07/20/2022: FOTO 55 05/25/2022: FOTO score: 50,  TR score: 45 Eval: FOTO score: 40,  TR score: 45 Goal status: Achieved   2.   Pt. will independently perform oral care for 100% of the task after complete set-up. Baseline: 01/09/2023: Continue 12/28/2022:  Pt. Continues to complete 90% of the task with complete  set-up, with visual cues, and occassional assist of the right elbow. 11/16/2022: Pt. Completes 90% of the task with complete set-up. 09/26/2022: Completes 90% of the task, proximal assist at times required at the elbow.  08/15/2022: Completes 90% of the task, proximal assist at times required at the elbow. 07/20/22: completes 90% of task, limited by shoulder flexion. 05/25/2022:  Pt. Is able to initiate and perform oral care for approximately 90% of the task. Complete set-up required. Assi needed only for the very back teeth. 03/23/2022: Pt. Is able to initiate and perform oral care for approximately 75% of the task. Pt. Requires assist at proximally  at the elbow for through oral care. 03/21/2022: Pt. Is able to initiate and perform oral care for approximately 75% of the task. Pt. Requires assist at proximally at the elbow for through oral care. Eval: Pt. is able to initiate using an electric toothbrush. Pt. requires assist for set-up, and assist for thoroughness, and as he Pt. fatigues. Goal status: Ongoing  3.  Pt. Will be modified independence with self-feeding for 100% using a swivel spoon, and standard fork after complete set-up Baseline: 01/09/2023: Continue 12/28/2022: Pt. Is now feeding himself 90% of the time using a swivel spoon with the left hand, and 95% of the time with the fork. Pt. Continues to have difficulty with cutting food. 11/16/2022: 100% with finger foods, Pt. Is initiating with a swivel spoon, and universal cuff for fork use, however requires assist for consistency, and accuracy.  09/26/2022: 90% using the spoon, assist at time with the forearm motion, and grip on the utensils. 100% for finger foods.  08/15/2022:  90% using the spoon, assit at time with the forearm motion, and grip on the utensils. 100% for finger foods-independent with set-up- unsupervised.  07/20/22: self-feeds cereal using spoon 90% of task. 05/25/2022: Pt. Is able to use a spoon to scoop cereal when feeding himself cereal 85% of  the time. Pt. Is able to feed himself snack/finger foods 100% of the time. Pt. Continues to work on consistency of  stabilizing a cup/mug when drinking. Pt. Is able to grasp a water bottle with assist initially, with assist tapering off as he drinks.03/23/2022: Pt. Is able to perform scooping cereal for 75% of the time. Pt. required assist, and support at the left elbow, and Pt. Presents with limited forearm supination when using the spoon, and bringing it towards his mouth. Pt. Is able to use a fork to spear items, and perform the hand to mouth pattern.  03/21/2022: Pt. Is able to perform scooping cereal for 75% of the time. Pt. required assist, and support at the left elbow, and Pt. presents with limited forearm supination when using the spoon, and bringing it towards his mouth. Pt. Is able to use a fork to spear items, and perform the hand to mouth pattern.  Eval: Pt. is able to hold standard standard utensils. Pt. Performs as much of the task as he, can and has assistance for the remainder. Goal status:  Met/Revised to 100% of the meal with a swivel spoon, and standard fork.  4.  Pt. Will improve grasp patterns and consistently grasp 1/4" objects for ADL, and IADL tasks.  Baseline: 01/09/2023: Continue 12/28/2022: Continues to grasp 1/4" pegs with min a + visual cues, consistently grasping 1/2" objects with visual cues 11/16/2022: Continues to grasp 1/4" pegs with min a + visual cues, consistently grasping 1/2" objects with visual cues 09/26/2022: Continues to grasp 1/4" pegs with min a + visual cues, consistently grasping 1/2" objects with visual cues. 08/15/2022: grasps 1/4" pegs with min a + visual cues, consistently grasping 1/2" objects with visual cues. 07/20/22: grasps 1/4" pegs with min a + visual cues, consistently grasping 1/2" objects with visual cues. 05/25/2022: Pt. Is working on improving consistency of grasping 1/2" objects with visual cues.  03/23/2022: Pt. Is able to grasp 1" objects  consistently,and continues to work on the hand patterns needed to grasp 1/2" objects.03/21/2022: Pt. Is able to grasp 1" objects consistently,and continues to work on the hand patterns needed to grasp 1/2" objects. Eval: Pt. is able to grasp 1" objects intermittently using  a lateral grasp pattern. Goal status: Ongoing  5.  Pt. will independently write his name legibly with letter sizes under 1". Baseline: 01/09/2023: Continue. 12/28/2022: Pt. is now using an IPad Pen. 11/16/2022: Pt. Continues to be able to write name with smaller letter size. Pt. Is signing name with a computer styus. Pt. Requires visual cues, and assist 09/26/2022: Pt. Continues to be able to write name with smaller letter size. Pt. Is signing name with a computer styus. Pt. Requires visual cues, and assist. 08/15/2022: Pt. Is able to write name with smaller letter size. Pt. Is signing name with a computer styus. Pt. Requires visual cues, and assist. 07/20/22: stabilizing assist to write name with 75% legibility with 2" letters. 05/25/2022: Pt. to continue to work towards formulating grasp patterns in preparation for grasping a large width pen. Pt. Requires visual cues. 03/23/2022: Pt. Is able to write his name with modA, however has difficulty with formulating letter sizes less than 2" in size with 50% legibility for the 3 letters of his name.03/21/2022: Pt. Is able to write his name with modA, however has difficulty with formulating letter sizes less than 2" in size with 50% legibility for the 3 letters of his name. Eval: Pt is able to hold a thin marker with his left hand, and formulate a line, and initiate a circular pattern (Pt. without glasses today) Goal status: Ongoing  6. Pt. Will reach up to comb/brush his hair with minA.  Baseline: 01/09/2023: Continue 12/28/2022: Pt. Is progressing, and is now able to use a hair pick with the left hand on the right side of his head, top, and back of the head. Pt. Continues to have difficulty reaching  further to the right side of his head with the left. 11/16/2022: Pt. Is able to use a hair pick for the left side of his head using his left hand. Pt. Presents with difficulty reaching to the right side of his head.09/26/2022: Pt. Is able to use a hair pick for the left side of his head using his left hand. Pt. Presents with difficulty reaching to the right side of his head. 5/13/202: 75% using a hair pick, assist to the right side of the head. 07/20/22: reaches 75% of head, assist for far R side of head, fatigues quickly. 05/25/2022: Pt. Is able to reach up with the left hand to the left side, top, and back of his head. 03/23/2022: Pt. is now able to more consistently initiate reaching up to his head with his left hand in preparation for haircare12/18/2023: Pt. is now able to more consistently initiate reaching up to his head with his left hand in preparation for haircare. Eval: Pt. is able to initiate reaching up for hair care with a long handled brush, however is unable to sustain UE's in elevation to perform the task.     Goal status: Progressing/Ongoing  7. Pt. Will independently navigate the w/c through his environment with minA with visual scanning, and hand placement on the controls.  Baseline: 11/16/2022: Deferred 2/2 vision 09/26/2022: Pt. Is now navigating his w/c ouside the home, and around his block. 08/15/2022: Pt. Requires minA to setup hand on controls and MIN cues to navigate the w/c in wide spaces, requires MIN - MOD cues to navigate the w/c through more narrow doorways, and tighter turns - varies based on good vs bad vision days.07/20/22: Pt. Requires minA to setup hand on controls and MIN cues to navigate the w/c in wide spaces, requires MIN - MOD  cues to navigate the w/c through more narrow doorways, and tighter turns - varies based on good vs bad vision days. 05/25/2022: Pt. Requires minA  and  cues to navigate the w/c in wide spaces, and requires Mod cues to navigate the w/c through more narrow  doorways, and tighter turns. Pt. Requires max cues for scanning through the environment, and moderate cues for hand placement on the controls.     Goal status: Deferred 2/2 vision    8. Pt. Will improve bilateral grip strength to be able to independently grasp, and pull up blankets, and linens.   Baseline: 01/09/2023: Continue 12/28/2023: R: 0#, L: 5# Pt. Is now able to more consistently grasp and pull blankets up over him. 11/16/2022: R: 0 L: 8# Pt. Is more consistently grasping his blankets and attempting to pull them up. 09/26/2022: R: 0  L: 8# Pt. is attempting to grasp, and pull blankets up more, using mostly the left hand. 08/15/2022: Pt. has difficulty securely holding, and pulling up blankets, and linens.    Goal status: Partially met  9. Pt. will consistently actively control the releasing  of blankets, covers, and linens from his hands once they are in the desired position over him. Baseline:12/28/2022: Pt. Is consistently actively able to release linens once they are in the desired position over him. 11/16/2022: Pt. continues to improve with actively releasing blankets form his hands. 09/26/2022: Pt. is improving with actively releasing blankets form his hands. 08/15/2022: Pt. has difficulty with controlled releasing of blankets/linens from his grasp.     Goal status: Achieved    10. Pt. Will improve bilateral UE strength by 2 MM grades to assist with ADLs, and IADLs  Baseline: 01/09/2023: Continue 12/28/2022: Right: shoulder flexion: 3/5, abduction: 4/5, elbow flexion: 4+/5 , extension: 4-/5 wrist extension: 2/5; Left: shoulder flexion: 4+/5, abduction: 4+/5, elbow flexion: 5/5 , extension: 4/5  wrist extension: 3/5 Right: shoulder flexion: 3+/5, abduction: 4/5, elbow flexion: 4+/5 , extension: 4+/5 wrist extension: 2/5; Left: shoulder flexion: 4+/5, abduction: 4+/5, elbow flexion: 5/5 , extension: 4+/5 wrist extension: 3-/5 11/16/2022: Right: shoulder flexion: 3/5, abduction: 4/5, elbow flexion: 4+/5  , extension: 4+/5 wrist extension: 2/5; Left: shoulder flexion: 4+/5, abduction: 4+/5, elbow flexion: 5/5 , extension:  wrist extension: 3-/5 Right: shoulder flexion: 3+/5, abduction: 4/5, elbow flexion: 4+/5 , extension: 4+/5 wrist extension: 2/5; Left: shoulder flexion: 4+/5, abduction: 4+/5, elbow flexion: 5/5 , extension: 4+/5 wrist extension: 3-/5 Goal status: Ongoing  11. Pt. Will improve right wrist extension by 10 degrees to initiate reaching for items in preparation for ADLs. Baseline: 01/09/2023: Continue 12/28/2022: -22(34) 11/16/2022: -30(10)    Goal status: Progressing   ASSESSMENT: CLINICAL IMPRESSION:  Pt. reports having had a surprise outing to the zoo with a picnic last weekend. Pt. Presents today with congestion. Measurements were obtained, and goals were updated last visit. Pt. To continue to continue with the same POC and goals updated at the last treatment session. Pt. Continues to make consistent steady progress over this past progress reporting period. Pt. has progressed with BUEs AROM, and continues to increase engagement of the BUEs during daily tasks. Is improving with self feeding skills, grooming tasks, reaching further while performing hair care,  as well as grasping and pulling up linens/blankets over him. Pt. Is actively releasing the blankets from his fingers consistently on his own volition. Pt. Is improving with bilateral thumb radial abduction, and bilateral wrist extension.  Pt. presents with bilateral digit MP hyperextension, and Digit PIP, and DIP  flexor tightness. Pt. continues to be highly motivated to continue to work towards  improving BUE functioning, ROM, strength, motor control, [facility] skills, and visual compensatory strategies in order to maximize independence with daily ADLs, and IADL functioning.      PERFORMANCE DEFICITS in functional skills including ADLs, IADLs, coordination, dexterity, proprioception, ROM, strength, pain, FMC, GMC, decreased knowledge of  use of DME, and UE functional use, cognitive skills including safety awareness, and psychosocial skills including coping strategies, environmental adaptation, habits, and routines and behaviors.   IMPAIRMENTS are limiting patient from ADLs, IADLs, leisure, and social participation.   COMORBIDITIES may have co-morbidities  that affects occupational performance. Patient will benefit from skilled OT to address above impairments and improve overall function.  MODIFICATION OR ASSISTANCE TO COMPLETE EVALUATION: Maximum or significant modification of tasks or assist is necessary to complete an evaluation.  OT OCCUPATIONAL PROFILE AND HISTORY: Comprehensive assessment: Review of records and extensive additional review of physical, cognitive, psychosocial history related to current functional performance.  CLINICAL DECISION MAKING: High - multiple treatment options, significant modification of task necessary  REHAB POTENTIAL: Fair    EVALUATION COMPLEXITY: High    PLAN: OT FREQUENCY: 2 x's a week  OT DURATION:12 weeks  PLANNED INTERVENTIONS: self care/ADL training, therapeutic exercise, therapeutic activity, neuromuscular re-education, manual therapy, passive range of motion, patient/family education, and cognitive remediation/compensation RECOMMENDED OTHER SERVICES: PT  CONSULTED AND AGREED WITH PLAN OF CARE: Patient and family member/caregiver  PLAN FOR NEXT SESSION: Initiate treatment  Olegario Messier, MS, OTR/L   01/09/23, 5:28 PM

## 2023-01-11 ENCOUNTER — Ambulatory Visit: Payer: BC Managed Care – PPO

## 2023-01-11 ENCOUNTER — Ambulatory Visit: Payer: BC Managed Care – PPO | Admitting: Occupational Therapy

## 2023-01-16 ENCOUNTER — Ambulatory Visit: Payer: BC Managed Care – PPO | Admitting: Occupational Therapy

## 2023-01-16 ENCOUNTER — Ambulatory Visit: Payer: BC Managed Care – PPO

## 2023-01-16 DIAGNOSIS — I6930 Unspecified sequelae of cerebral infarction: Secondary | ICD-10-CM

## 2023-01-16 DIAGNOSIS — R2681 Unsteadiness on feet: Secondary | ICD-10-CM | POA: Diagnosis not present

## 2023-01-16 DIAGNOSIS — R262 Difficulty in walking, not elsewhere classified: Secondary | ICD-10-CM | POA: Diagnosis not present

## 2023-01-16 DIAGNOSIS — M6281 Muscle weakness (generalized): Secondary | ICD-10-CM | POA: Diagnosis not present

## 2023-01-16 DIAGNOSIS — R278 Other lack of coordination: Secondary | ICD-10-CM | POA: Diagnosis not present

## 2023-01-16 DIAGNOSIS — R269 Unspecified abnormalities of gait and mobility: Secondary | ICD-10-CM

## 2023-01-16 NOTE — Therapy (Signed)
OUTPATIENT PHYSICAL THERAPY TREATMENT    Patient Name: Paul Hall MRN: [id number] DOB:[birth year], 27 y.o., male Today's Date: 01/16/2023  PCP:  Cindi Carbon PROVIDER:  Lenise Herald, PA-C   PT End of Session - 01/16/23 1639     Visit Number 129    Number of Visits 146    Date for PT Re-Evaluation 02/22/23    Authorization Type [employer] COMM Pro/ [place] Medicaid    Progress Note Due on Visit 130    PT Start Time 1558    PT Stop Time 1615    PT Time Calculation (min) 17 min    Equipment Utilized During Treatment Gait belt    Activity Tolerance Patient tolerated treatment well    Behavior During Therapy WFL for tasks assessed/performed                    Past Medical History:  Diagnosis Date   Diabetes mellitus (HCC)    Hypertension    Stroke (HCC)    Past Surgical History:  Procedure Laterality Date   IVC FILTER PLACEMENT ([facility] HX)     LEG SURGERY     PEG TUBE PLACEMENT     TRACHEOSTOMY     Patient Active Problem List   Diagnosis Date Noted   Sepsis due to vancomycin resistant Enterococcus species (HCC) 06/06/2019   SIRS (systemic inflammatory response syndrome) (HCC) 06/05/2019   Acute lower UTI 06/05/2019   VRE (vancomycin-resistant Enterococci) infection 06/05/2019   Anemia 06/05/2019   Skin ulcer of sacrum with necrosis of muscle (HCC)    Urinary retention    Type 2 diabetes mellitus without complication, with long-term current use of insulin (HCC)    Tachycardia    Lower extremity edema    Acute metabolic encephalopathy    Obstructive sleep apnea    Morbid obesity with BMI of 60.0-69.9, adult (HCC)    Goals of care, counseling/discussion    Palliative care encounter    Sepsis (HCC) 04/27/2019   H/O insulin dependent diabetes mellitus 04/27/2019   History of CVA with residual deficit 04/27/2019   Seizure disorder (HCC) 04/27/2019   Decubitus ulcer of sacral region, stage 4 (HCC) 04/27/2019    REFERRING DIAG: Cerebral infarction,  unspecified   THERAPY DIAG:  Muscle weakness (generalized)  Other lack of coordination  History of CVA with residual deficit  Abnormality of gait and mobility  Difficulty in walking, not elsewhere classified  Unsteadiness on feet  Rationale for Evaluation and Treatment Rehabilitation  PERTINENT HISTORY: Paul Hall is a 26yoM who presents with severe weakness, quadriparesis, altered sensorium, and visual impairment s/p critical illness and prolonged hospitalization. Pt hospitalized in October 2020 with ARDS 2/2 COVID19 infection. Pt sustained a complex and lengthy hospitalization which included tracheostomy, prolonged sedation, ECMO. In this period pt sustained CVA and SDH. Pt has now been liberated from tracheostomy and G-tube. Pt has since been hospitalized for wound infection and UTI. Pt lives with parents at home, has hospital bed and left chair, hoyer lift transfers, and power WC for mobility needs. Pt needs heavy physical assistance with ADL 2/2 BUE contractures and motor dysfunction   PRECAUTIONS: Fall  SUBJECTIVE:  Patient reports feeling better after being sick last week. States still recovering but overall feeling better. Agreeable to try to stand with available time today- patient arrived approx 28 min late due to transportation broke down.   PAIN:  Are you having pain? No   TODAY'S TREATMENT:  - 01/16/2023  *patient dependently transferred from power  w/c to mat table via Michiel Sites +2 assist (mother- [name]- assist PT)          Therapeutic activities:   Sit to stand - max A + 2 (PT and patient's mother) - Continuing to focus on anterior weight shift, LE placement and reminders for patient to push through his forearms.    Static standing with Max A +2- x multiple trials- increased difficulty clearing bottom off mat today. Patient with some discomfort in knees and some difficulty extending gluteals and quads today- able to stand at most approx 30 sec but less erect  today.    PATIENT EDUCATION: Education details: Progression of intensity at home with trunk strength exercises and how to grade intensity using a goniometer bubble app.   HOME EXERCISE PROGRAM:  Access Code: [email or web address] URL: [email or web address] Date: 03/23/2022 Prepared by: Maureen Ralphs  Exercises - Supine Bridge  - 3 x weekly - 3 sets - 10 reps - 2 hold - Supine Gluteal Sets  - 3 x weekly - 3 sets - 10 reps - 5 sec hold - Supine Quad Set  - 1 x daily - 3 x weekly - 3 sets - 10 reps - 5 hold - Seated Long Arc Quad  - 1 x daily - 7 x weekly - 3 sets - 10 reps - Seated Hip Adduction Squeeze with Ball  - 1 x daily - 3 x weekly - 3 sets - 10 reps - 5 hold - Seated Hip Abduction  - 1 x daily - 3 x weekly - 3 sets - 10 reps - 2 hold    PT Short Term Goals -       PT SHORT TERM GOAL #1   Title Pt will be independent with HEP in order to improve strength and balance in order to decrease fall risk and improve function at home and work.    Baseline 01/04/2021= No formal HEP in place; 12/12 no HEP in place; 05/10/2021-Patient and his father were able to report compliance with curent HEP consisting of mostly seated/reclined LE strengthening. Both verbalize no questions at this time.    Time 6    Period Weeks    Status Achieved    Target Date 02/15/21            PT LONG TERM GOAL #1     Title Patient will increase BLE gross strength by 1/2 muscle grade to improve functional strength for improved independence with potential gait, increased standing tolerance and increased ADL ability.     Baseline 01/04/2021- Patient presents with 1/5 to 3-/5 B LE strength with MMT; 12/12: goal partially met for Left knee/hip; 05/10/2021= 2-/5 bilateal Hip flex; 3+/5 bilateral Knee ext; 06/21/2021= Patient presents with 2-/5 bilateral Hip flex; 3+/5 bilateral knee ext/flex; 2-/5 left ankle DF; 0/5 right ankle- and able to increase reps and resistance with LE's. 09/15/2021- Patient technically  presents with 2-/5 B hip flex/abd/add - but he is able to raise his hip up to approx 100 deg which has improved. 3+/5 Bilateral knee ext, 2-/5 left ankle and 0/5 right ankle.  12/08/2021= Patient able to lift left knee at 110 deg of hip flex; presents with 3+/5 knee ext, 2-/5 left ankle DF and 0/5 right ankle DF, 2-/5 bilateral Hip abd in seated position.    12/6: R: knee 3+/5 ext, 2/5 flexion, left knee 3+/5 extension, 3+/5 flexion, R hip: 2+/5 hip add, 2+/5 hip ABD L hip: 4-/5 hip ABD, 3+/5 hip ADD, 3+/5 hip flexion; 06/06/2022= Patient  now presents with 2-/5 right ankle DF/PF;     Time 12     Period Weeks     Status MET    Target Date 03/07/2022         PT LONG TERM GOAL #2    Title Patient will tolerate sitting unsupported demonstrating erect sitting posture for 15 minutes with CGA to demonstrate improved back extensor strength and improved sitting tolerance.     Baseline 01/04/2021- Patient confied to sitting in lift chair or electric power chair with back support and unable to sit upright without physical assistance; 12/12: tolerates <1 minutes upright unsupported sitting. 05/10/2021=static sit with forward trunk lean  in his power wheelchair without back support x approx 3 min. 06/21/2021=Unable to assess today due to patient with acute back pain but on previous visit able to sit x 8 min without back support. 09/15/2021- on last visit- 09/13/2021- patient was able to sit unsupported x 8 min at edge of mat. 10/13/2023 - Patient was able to sit at edge of mat with varying level of assist today from SBA to min A for a total of 20 min. 12/13/2021= Patient demonstrated unsupported sitting at edge of mat for approx 20 min    Time 12     Period Weeks     Status GOAL MET    Target Date 12/08/21          PT LONG TERM GOAL #3    Title Patient will demonstrate ability to perform static standing in // bars > 2 min with Max Assist  without loss of balance and fair posture for improved overall strength for pre-gait  and transfer activities.     Baseline 01/04/2021= Patient current uanble to stand- Dependent on hoyer or sit to stand lift for transfers. 05/10/2021=Not appropriate yet- Currently still dependent with all transfers using hoyer. 06/21/2021= Patient continuing now to focus on LE strengthening to prepare for standing-unable to try today due to acute low back pain-  planning on attempting in new cert period. 09/15/2021- Patient has attempted standing 2x in past two week- max Assist of 2 people - only once was he successful to clearing his bottom from chair - Will continue to be a focus during the new certification. 12/13/2021= Patient has been limited secondary to increased overall low back pain during this certification and will require more time to focus on this goal.  12/6: not assessed this date, will assess at date when 2-3 PTs are present for assistance     Time 12     Period Weeks     Status GOAL not appropriate at this time - may attempt in future once Patient presents with improved overall LE strength.                 PT LONG TERM GOAL #4    Title Pt will improve FOTO score by 10 points or more demonstrating improved perceived functional ability     Baseline FOTO 7 on 10/17; 03/15/21: FOTO 12; 05/10/2021 06/21/2021= 1; 09/15/2021= 9; 12/13/2021= Will issue next visit 12/6: 4; 06/06/2022= Will assess next visit; Goal not appropriate as patient is not ambulatory.     Time 12     Period Weeks     Status WITHDRAWN    Target date 11/30/2022         PT LONG TERM GOAL #5    Title Patient will perform sit to stand transfer with appropriate AD and max assist of 2 people with 75% consistency  to prepare for pregait activities.     Baseline 09/15/2021= Patient unable to stand well- unable to clear his bottom off chair with Max assist of 2 persons. 12/13/2021- Goal not appropriate to try yet but will keep and roll over to next cert as shift continues to focus on transfers/standing; 4/24= Patient able to perform active  ankle DF/PF with right LE and able to raise his knee into seated march and clear floor without physical assist today - as previously unable as well as lift right knee ext to near full ROM to improve strength for eventual standing. 09/07/2022- Goal continues to not be appropriate but patient is now standing some and will keep goal active; 11/23/2022= patient is now performing sit to stand with max A +2 and now able to clear his bottom off mat. 11/30/2022- Patient continues to perform sit to stand transfers with Max A+2 and able to clear bottom well off mat but not erect yet-     Time 12     Period Weeks     Status PROGRESSING    Target date  02/22/2023       PT LONG TERM GOAL #6  Title Patient will tolerate sitting unsupported demonstrating erect sitting posture for 30 minutes with CGA to demonstrate improved back extensor strength and improved sitting tolerance.   Baseline 12/13/2021= Patient demonstrated unsupported sitting at edge of mat for approx 20 min;  12/29/2021- Patient performed approx 30 min of dynamic sitting activities today. 06/06/2022= Patient demonstrated ability to sit and perform static and dynamic UE/LE movement with only Supervision.   Time 12   Period Weeks   Status GOAL MET           7.  Patient will tolerate 2 minutes or more of standing in sit to stand lift or with max assist +2 in order to indicate improved lower extremity weightbearing tolerance for progression to standing in parallel bars. Baseline: 1 minute on most recent stand 02/21/22; 06/06/2022- Patient did attempt today after complaining of right LE pain last week. He attempted 3 stands using sit to stand lift. - all over 1 min- last one approx 48 sec- stopped due to fatigue. More erect standing in lift today - still poor gluteal strength but able to activate glutes and extend hips upon command but unable to hold > 5 sec. 07/28/2022- Will attempt standing next visit but patient has been able to stand since last progress  note for up to 3 min at a time at his best. 09/07/2022-  attempted standing with max +2 and patient able to stand 15-20 sec so will now  revise goal; 11/23/2022- Patient at his best between last 2 visits was able to stand approx 50 sec with max A+2.  11/30/2022- Patient performed 4 trials of static standing - max A +2- clearing bottom and using forearms to bear weight  Goal status: Revised Target date: 02/22/2023   8. Patient will tolerate sitting unsupported demonstrating ability to perform dynamic UE/LE activities for 30 minutes  independently to demonstrate ability to sit at edge of bed to eat or perform some ADL's/exercise for optimal quality of life.             Baseline: 06/06/2022- Patient able to sit and perform some UE/LE exercises but requires CGA at times for safety- he is able to static sit for 30 min with supervision. 07/27/2022= Patient able to now sit > 30 min with Supervision only - performing static and dynamic activities. Will keep  goal active to ensure patient is consistent. 09/07/2022=  Patient able to demonstrate consistent ability to sit at edge of mat during visits             Goal Status: MET             Target date: 08/29/2022        Plan     Clinical Impression Statement  Treatment was severely limited today secondary to patient arriving late due to transportation issues- Zenaida Niece broke down. Paitent still arrived with excellent motivation to try to stand with remaining time. He was able to stand with max assist yet not as erect as past visits- could be due to lack of warm up or the fact that he is still recovering from being sick last week.   Pt will continue to benefit from skilled physical therapy intervention to address impairments, improve QOL, and attain therapy goals.    Personal Factors and Comorbidities Comorbidity 3+;Time since onset of injury/illness/exacerbation    Comorbidities CVA, diabetes, Seizures    Examination-Activity Limitations Bathing;Bed Mobility;Bend;Caring for  Others;Carry;Dressing;Hygiene/Grooming;Lift;Locomotion Level;Reach Overhead;Self Feeding;Sit;Squat;Stairs;Stand;Transfers;Toileting    Examination-Participation Restrictions Cleaning;Community Activity;Driving;Laundry;Medication Management;Meal Prep;Occupation;Personal Finances;Shop;Yard Work;Volunteer    Stability/Clinical Decision Making Evolving/Moderate complexity    Rehab Potential Fair    PT Frequency 2x / week    PT Duration 12 weeks    PT Treatment/Interventions ADLs/Self Care Home Management;Cryotherapy;Electrical Stimulation;Moist Heat;Ultrasound;DME Instruction;Gait training;Stair training;Functional mobility training;Therapeutic exercise;Balance training;Patient/family education;Orthotic Fit/Training;Neuromuscular re-education;Wheelchair mobility training;Manual techniques;Passive range of motion;Dry needling;Energy conservation;Taping;Visual/perceptual remediation/compensation;Joint Manipulations    PT Next Visit Plan core strength/motor control in short-sitting, sit to stand if able; Continue with progressive LE Strengthening. Stand activities as appropriate.     PT Home Exercise Plan No changes to HEP today    Consulted and Agree with Plan of Care Patient;Family member/caregiver    Family Member Consulted            4:40 PM, 01/16/23 Louis Meckel, PT Physical Therapist - [facility]  Outpatient Physical Therapy- [facility] [phone]      01/16/2023, 4:40 PM

## 2023-01-16 NOTE — Therapy (Signed)
Occupational Therapy Neuro Treatment Note  Patient Name: Paul Hall MRN: [id number] DOB:11-10-1995, 27 y.o., male Today's Date: 01/16/2023  PCP: Dr. Sherwood Gambler REFERRING PROVIDER: Dr. Sherwood Gambler   OT End of Session - 01/16/23 1712     Visit Number [id number]    Number of Visits 84    Date for OT Re-Evaluation 02/08/23    OT Start Time 1622    OT Stop Time 1700    OT Time Calculation (min) 38 min    Equipment Utilized During Treatment tilt in space power wc    Activity Tolerance Patient tolerated treatment well    Behavior During Therapy WFL for tasks assessed/performed                  Past Medical History:  Diagnosis Date   Diabetes mellitus (HCC)    Hypertension    Stroke [facility])    Past Surgical History:  Procedure Laterality Date   IVC FILTER PLACEMENT ([facility] HX)     LEG SURGERY     PEG TUBE PLACEMENT     TRACHEOSTOMY     Patient Active Problem List   Diagnosis Date Noted   Sepsis due to vancomycin resistant Enterococcus species (HCC) 06/06/2019   SIRS (systemic inflammatory response syndrome) (HCC) 06/05/2019   Acute lower UTI 06/05/2019   VRE (vancomycin-resistant Enterococci) infection 06/05/2019   Anemia 06/05/2019   Skin ulcer of sacrum with necrosis of muscle (HCC)    Urinary retention    Type 2 diabetes mellitus without complication, with long-term current use of insulin (HCC)    Tachycardia    Lower extremity edema    Acute metabolic encephalopathy    Obstructive sleep apnea    Morbid obesity with BMI of 60.0-69.9, adult (HCC)    Goals of care, counseling/discussion    Palliative care encounter    Sepsis (HCC) 04/27/2019   H/O insulin dependent diabetes mellitus 04/27/2019   History of CVA with residual deficit 04/27/2019   Seizure disorder (HCC) 04/27/2019   Decubitus ulcer of sacral region, stage 4 (HCC) 04/27/2019   ONSET DATE: 01/2019  REFERRING DIAG: CVA/COVID-19  THERAPY DIAG:  Muscle weakness (generalized)  Other lack of  coordination  Rationale for Evaluation and Treatment Rehabilitation  SUBJECTIVE:   SUBJECTIVE STATEMENT:  Pt. Reports feeling better today.  Pt accompanied by: self and family member  PERTINENT HISTORY:  Pt. is a 27 y.o. male who was diagnosed with COVID-19, and CVA with resultant quadriplegia. Pt. Was then hospitalized with VRE UTI. PMHx includes: urinary retention, seizure disorder, obstructive sleep disorder, DM Type II, Morbid obesity.   PRECAUTIONS: None  WEIGHT BEARING RESTRICTIONS No  PAIN:  Are you having pain? No reports of pain  FALLS: Has patient fallen in last 6 months? No  LIVING ENVIRONMENT: Lives with: lives with their family Lives in: House/apartment Stairs: No Level Entry Has following equipment at home: Wheelchair (power) and hoyer lift, sit to stand lift  PLOF: Independent  PATIENT GOALS: To be able to engage in more daily care tasks.  OBJECTIVE:   HAND DOMINANCE: Right  ADLs:  Transfers/ambulation related to ADLs: Eating: Pt. reports being able to hold standard utensils, and is starting to engage more in self-feeding tasks, hand to mouth patterns. Pt. Reports that he does as much as he can with the task, and family assists  with the remainder of the task. Grooming: Pt. Is able to initiate holding an electric toothbrush, and brush his teeth. Family assists LB Dressing: Total Assist UE dressing:  Pt. is now able to reach up to actively assist with grasping , and pulling his gown down. Toileting:  Total Assist Bathing: MaxA UB, Total assist LB Tub Shower transfers: N/A Equipment: See above    IADLs: Shopping: Relies on family to assist Light housekeeping: Total Assist Meal Prep: Total Assist Community mobility:   Medication management:  Total Assist  Financial management: N/A Handwriting: Not legible: Pt. Is able to hold a pen with the left hand, and initiate marking the page. Pt.'s eye glasses were not available  MOBILITY STATUS:  Power  w/c  POSTURE COMMENTS:  Pt. Requires position changes in his power w/c  ACTIVITY TOLERANCE: Activity tolerance:  Fair  FUNCTIONAL OUTCOME MEASURES: FOTO: 40  TR score: 45  05/25/2022:   FOTO: 50 TR score: 45  07/20/22: FOTO 55  UPPER EXTREMITY ROM     Active ROM Left eval Left  03/21/2022 Left 05/25/2022 L  07/20/22 Left 07/25/2022 Left  08/15/2022 Left  09/26/2022 Left  11/16/2022 Left 12/28/2022  Shoulder flexion 119 123 125 130  136 138 130 142  Shoulder abduction 110 115 115 115  118 121 125 142  Shoulder adduction           Shoulder extension           Shoulder internal rotation           Shoulder external rotation           Elbow flexion 135(135) 145 145 145  145     Elbow extension -27(-18) -21(-20) -20(-16)  -25(-10) -23(-10) -21(-10) -20(-18) -20(-18)  Wrist flexion           Wrist extension 10(50) 19(50) 28(50)  30(50) 35(55) 36(55) 36(60) 40(60)  Wrist ulnar deviation           Wrist radial deviation           Wrist pronation           Wrist supination   Limited by flexor tone        (Blank rows = not tested)    Active ROM Right eval Right 03/21/2022 Right 05/25/2022 R  07/20/22 Right  07/25/22 Right 08/15/2022 Right 09/26/2022 Right 11/16/2022 Right 12/28/2022  Shoulder flexion 106 scaption 105  Scaption 62 flexion 110 scaption 100  105 105 105 112 Scaption  Shoulder abduction 114 90 97 100  105 117 120 124  Shoulder adduction           Shoulder extension           Shoulder internal rotation           Shoulder external rotation           Elbow flexion 120(130) 145 145 140  144 140 142 148  Elbow extension -45(-35) -22(-35) -28(-22)  -32(-21) -32(-28) -28(-26) -28(-28) -27(-26)  Wrist flexion           Wrist extension -30(10) -25(10) -10(30)  -10(20) -10(25) -20(40) -30(10) -22(34)  Wrist ulnar deviation           Wrist radial deviation           Wrist pronation           Wrist supination   Limited by flexor tone          12/28/2022: Thumb radial  abduction: Right: 12(46), Left: 40(54)  Digits: Bilateral 2nd through 5th digit: MP Hyperextension with PIP, an DIP flexion     UPPER EXTREMITY MMT:     Left eval  Left 03/21/2022 Left  05/25/2022 L 07/20/22 Left  08/15/2022 Left 09/26/2022 Left 11/16/2022 Left 12/28/2022  Shoulder flexion 3-/5 3/5 3+/5 4+/5 4+/5 4+/5 4+/5 4+/5  Shoulder abduction 3-/5 3-/5 4-/5 4+/5 4+/5 4+5 4+/5 4+/5  Shoulder adduction          Shoulder extension          Shoulder internal rotation          Shoulder external rotation          Elbow flexion 3/5 3+/5 4-/5 4+/5 5/5 5/5 5/5 5/5  Elbow extension 2-/5 2/5  4+/5 4+/5 4+/5  4/5  Wrist flexion          Wrist extension 2/5 2+/5 3-/5  3-/5 3-/5 3-/5 3/5  Wrist ulnar deviation          Wrist radial deviation          Wrist pronation          Wrist supination          (Blank rows = not tested)  Right eval Right 03/21/2022 Right  05/25/2022 R 07/20/22 Right 08/15/2022 Right 09/26/2022 Right 11/16/2022 Right 12/28/2022  Shoulder flexion 3-/5 scaption 3-/5 3-/5 3+/5 3+/5 3+/5 3/5 3/5  Shoulder abduction 3-/5 3-/5 3-/5 4/5 4/5 4/5 4/5 4/5  Shoulder adduction          Shoulder extension          Shoulder internal rotation          Shoulder external rotation          Elbow flexion 3/5 3/5 3+/5 4/5 4+/5 4+/5 4+/5 4+/5  Elbow extension 2-/5 2/5 3-/5 4+/5 4+/5 4+/5 4+/5 4-/5  Wrist flexion          Wrist extension 2-/5 2-/5 2/5 2/5 2/5 2/5 2/5 2/5  Wrist ulnar deviation          Wrist radial deviation          Wrist pronation          Wrist supination            HAND FUNCTION:  Grip strength: Right: 0 lbs; Left: 0 lbs and Lateral pinch: Right: 5 lbs, Left: 2 lbs  03/21/2022: Lateral pinch: Right: 3.5 lbs, Left: 2 lbs  07/20/22: Grip strength: Right: 3 lbs; Left: 4 lbs and Lateral pinch: Right: 5 lbs, Left: 3 lbs  08/15/2022:  Grip strength: Right: 0 lbs; Left: 5 lbs and Lateral pinch: Right: 2 lbs, Left: 4 lbs  09/26/2022  Grip strength: Right: 0  lbs; Left: 8 lbs and Lateral pinch: Right: 2 lbs, Left: 5 lbs  11/16/2022  Grip strength: Right: 0 lbs; Left: 8 lbs  9/25/20204  Grip strength: Right: 0 lbs; Left: 8 lbs    Bilateral digit PIP/DIP flexion contractures with MP hyperextension with attempts for AROM. Pt. is able to tolerate AROM to the bilateral digits at the initial evaluation however, has a history of pain in the digits.  COORDINATION: Eval: Pt. is unable to grasp 9-hole test pegs. Pt. is able to initiate grasping larger pegs, and is able to hold a pen in the left hand.  07/20/22: 2 min 36 seconds to remove 9 pegs from 9 hole peg test - cues to locate pegs 2/2 low vision. Pt. is able to initiate grasping larger pegs on R hand and is able to hold a pen in the left hand.  08/15/2022:      Vision, and sensation limiting accuracy of 9 hole  peg test results. Pt. Was able to grasp and remove vertical pegs intermittently with cues.    SENSATION: Light touch: Impaired   EDEMA:  N/A  MUSCLE TONE: BUE flexor Spasticity  COGNITION Overall cognitive status: Continue to assess in functional context  VISION:   Subjective report: Pt. was not wearing glasses at the time of the initial eval.  Baseline vision: Vision is very limited. Wears glasses all the time Visual history: History of impaired vision following CVA. Pt. Has received treatment through the [facility] low vision rehabilitation program.   VISION ASSESSMENT: Impaired To be further assessed in functional context  PERCEPTION: Impaired   PRAXIS: Impaired: motor planning  OBSERVATIONS:  Pt reports being on Tramadol   TODAY'S TREATMENT   Therapeutic Ex.:  Pt. performed AROM for right shoulder flexion, abduction. Emphasis was placed on slow prolonged gentle passive stretching for RUE, forearm supination, bilateral wrist extension, wrist extension, digit MP, PIP, and DIP extension following moist heat. ROM was performed to decrease stiffness, and prepare the RUE for  functional use. ROM was performed following moist heat to the shoulder right elbow, forearm, wrist, and digits. PROM for Left digit MP, PIP, and DIP extension. Pt. worked on bilateral grip, and pinch strengthening  using Pink level of resistance theraputty, upgraded from soft light blue level of resistance theraputty.  Visual, tactile and verbal cues were required for eliciting the desired movement.    PATIENT EDUCATION: Education details: Doctor, general practice and fork positioning for self feeding Person educated: Patient and Parent Education method: Explanation, Demonstration, Tactile cues, and Verbal cues Education comprehension: verbalized understanding, returned demonstration, verbal cues required, and needs further education  HOME EXERCISE PROGRAM:  Continue ongoing assessment, and continue to provide as needed.   GOALS: Goals reviewed with patient? Yes  SHORT TERM GOALS: Target date: 11/07/2022   To assess splint fit, and make appropriate adjustments to promote good skin integrity through the palmar surface of the bilateral hands.  Baseline: 05/25/22: Goal currently met, however ongoing as needs to assess splint fit arise. 03/23/2022: Pt. is wearing splints a couple of hours at night bilateral resting hand splints. 03/21/2022: Pt. is wearing splints a couple of hours at night bilateral resting hand splints. Goal status: Deferred   LONG TERM GOALS: Target date: 02/08/2023    FOTO score will Improve by 2 points for Pt. perceived improvement with the assessment specific ADL/IADL tasks.  Baseline: 12/28/2022: FOTO score: 56; 11/16/2022: 52 09/27/2022: 51 07/20/2022: FOTO 55 05/25/2022: FOTO score: 50,  TR score: 45 Eval: FOTO score: 40,  TR score: 45 Goal status: Achieved   2.   Pt. will independently perform oral care for 100% of the task after complete set-up. Baseline: 01/09/2023: Continue 12/28/2022:  Pt. Continues to complete 90% of the task with complete set-up, with visual cues, and occassional  assist of the right elbow. 11/16/2022: Pt. Completes 90% of the task with complete set-up. 09/26/2022: Completes 90% of the task, proximal assist at times required at the elbow.  08/15/2022: Completes 90% of the task, proximal assist at times required at the elbow. 07/20/22: completes 90% of task, limited by shoulder flexion. 05/25/2022:  Pt. Is able to initiate and perform oral care for approximately 90% of the task. Complete set-up required. Assi needed only for the very back teeth. 03/23/2022: Pt. Is able to initiate and perform oral care for approximately 75% of the task. Pt. Requires assist at proximally at the elbow for through oral care. 03/21/2022: Pt. Is able to initiate and  perform oral care for approximately 75% of the task. Pt. Requires assist at proximally at the elbow for through oral care. Eval: Pt. is able to initiate using an electric toothbrush. Pt. requires assist for set-up, and assist for thoroughness, and as he Pt. fatigues. Goal status: Ongoing  3.  Pt. Will be modified independence with self-feeding for 100% using a swivel spoon, and standard fork after complete set-up Baseline: 01/09/2023: Continue 12/28/2022: Pt. Is now feeding himself 90% of the time using a swivel spoon with the left hand, and 95% of the time with the fork. Pt. Continues to have difficulty with cutting food. 11/16/2022: 100% with finger foods, Pt. Is initiating with a swivel spoon, and universal cuff for fork use, however requires assist for consistency, and accuracy.  09/26/2022: 90% using the spoon, assist at time with the forearm motion, and grip on the utensils. 100% for finger foods.  08/15/2022:  90% using the spoon, assit at time with the forearm motion, and grip on the utensils. 100% for finger foods-independent with set-up- unsupervised.  07/20/22: self-feeds cereal using spoon 90% of task. 05/25/2022: Pt. Is able to use a spoon to scoop cereal when feeding himself cereal 85% of the time. Pt. Is able to feed himself  snack/finger foods 100% of the time. Pt. Continues to work on consistency of  stabilizing a cup/mug when drinking. Pt. Is able to grasp a water bottle with assist initially, with assist tapering off as he drinks.03/23/2022: Pt. Is able to perform scooping cereal for 75% of the time. Pt. required assist, and support at the left elbow, and Pt. Presents with limited forearm supination when using the spoon, and bringing it towards his mouth. Pt. Is able to use a fork to spear items, and perform the hand to mouth pattern.  03/21/2022: Pt. Is able to perform scooping cereal for 75% of the time. Pt. required assist, and support at the left elbow, and Pt. presents with limited forearm supination when using the spoon, and bringing it towards his mouth. Pt. Is able to use a fork to spear items, and perform the hand to mouth pattern.  Eval: Pt. is able to hold standard standard utensils. Pt. Performs as much of the task as he, can and has assistance for the remainder. Goal status:  Met/Revised to 100% of the meal with a swivel spoon, and standard fork.  4.  Pt. Will improve grasp patterns and consistently grasp 1/4" objects for ADL, and IADL tasks.  Baseline: 01/09/2023: Continue 12/28/2022: Continues to grasp 1/4" pegs with min a + visual cues, consistently grasping 1/2" objects with visual cues 11/16/2022: Continues to grasp 1/4" pegs with min a + visual cues, consistently grasping 1/2" objects with visual cues 09/26/2022: Continues to grasp 1/4" pegs with min a + visual cues, consistently grasping 1/2" objects with visual cues. 08/15/2022: grasps 1/4" pegs with min a + visual cues, consistently grasping 1/2" objects with visual cues. 07/20/22: grasps 1/4" pegs with min a + visual cues, consistently grasping 1/2" objects with visual cues. 05/25/2022: Pt. Is working on improving consistency of grasping 1/2" objects with visual cues.  03/23/2022: Pt. Is able to grasp 1" objects consistently,and continues to work on the hand  patterns needed to grasp 1/2" objects.03/21/2022: Pt. Is able to grasp 1" objects consistently,and continues to work on the hand patterns needed to grasp 1/2" objects. Eval: Pt. is able to grasp 1" objects intermittently using a lateral grasp pattern. Goal status: Ongoing  5.  Pt. will independently write  his name legibly with letter sizes under 1". Baseline: 01/09/2023: Continue. 12/28/2022: Pt. is now using an IPad Pen. 11/16/2022: Pt. Continues to be able to write name with smaller letter size. Pt. Is signing name with a computer styus. Pt. Requires visual cues, and assist 09/26/2022: Pt. Continues to be able to write name with smaller letter size. Pt. Is signing name with a computer styus. Pt. Requires visual cues, and assist. 08/15/2022: Pt. Is able to write name with smaller letter size. Pt. Is signing name with a computer styus. Pt. Requires visual cues, and assist. 07/20/22: stabilizing assist to write name with 75% legibility with 2" letters. 05/25/2022: Pt. to continue to work towards formulating grasp patterns in preparation for grasping a large width pen. Pt. Requires visual cues. 03/23/2022: Pt. Is able to write his name with modA, however has difficulty with formulating letter sizes less than 2" in size with 50% legibility for the 3 letters of his name.03/21/2022: Pt. Is able to write his name with modA, however has difficulty with formulating letter sizes less than 2" in size with 50% legibility for the 3 letters of his name. Eval: Pt is able to hold a thin marker with his left hand, and formulate a line, and initiate a circular pattern (Pt. without glasses today) Goal status: Ongoing  6. Pt. Will reach up to comb/brush his hair with minA.  Baseline: 01/09/2023: Continue 12/28/2022: Pt. Is progressing, and is now able to use a hair pick with the left hand on the right side of his head, top, and back of the head. Pt. Continues to have difficulty reaching further to the right side of his head with the  left. 11/16/2022: Pt. Is able to use a hair pick for the left side of his head using his left hand. Pt. Presents with difficulty reaching to the right side of his head.09/26/2022: Pt. Is able to use a hair pick for the left side of his head using his left hand. Pt. Presents with difficulty reaching to the right side of his head. 5/13/202: 75% using a hair pick, assist to the right side of the head. 07/20/22: reaches 75% of head, assist for far R side of head, fatigues quickly. 05/25/2022: Pt. Is able to reach up with the left hand to the left side, top, and back of his head. 03/23/2022: Pt. is now able to more consistently initiate reaching up to his head with his left hand in preparation for haircare12/18/2023: Pt. is now able to more consistently initiate reaching up to his head with his left hand in preparation for haircare. Eval: Pt. is able to initiate reaching up for hair care with a long handled brush, however is unable to sustain UE's in elevation to perform the task.     Goal status: Progressing/Ongoing  7. Pt. Will independently navigate the w/c through his environment with minA with visual scanning, and hand placement on the controls.  Baseline: 11/16/2022: Deferred 2/2 vision 09/26/2022: Pt. Is now navigating his w/c ouside the home, and around his block. 08/15/2022: Pt. Requires minA to setup hand on controls and MIN cues to navigate the w/c in wide spaces, requires MIN - MOD cues to navigate the w/c through more narrow doorways, and tighter turns - varies based on good vs bad vision days.07/20/22: Pt. Requires minA to setup hand on controls and MIN cues to navigate the w/c in wide spaces, requires MIN - MOD cues to navigate the w/c through more narrow doorways, and tighter turns - varies  based on good vs bad vision days. 05/25/2022: Pt. Requires minA  and  cues to navigate the w/c in wide spaces, and requires Mod cues to navigate the w/c through more narrow doorways, and tighter turns. Pt. Requires max cues  for scanning through the environment, and moderate cues for hand placement on the controls.     Goal status: Deferred 2/2 vision    8. Pt. Will improve bilateral grip strength to be able to independently grasp, and pull up blankets, and linens.   Baseline: 01/09/2023: Continue 12/28/2023: R: 0#, L: 5# Pt. Is now able to more consistently grasp and pull blankets up over him. 11/16/2022: R: 0 L: 8# Pt. Is more consistently grasping his blankets and attempting to pull them up. 09/26/2022: R: 0  L: 8# Pt. is attempting to grasp, and pull blankets up more, using mostly the left hand. 08/15/2022: Pt. has difficulty securely holding, and pulling up blankets, and linens.    Goal status: Partially met  9. Pt. will consistently actively control the releasing  of blankets, covers, and linens from his hands once they are in the desired position over him. Baseline:12/28/2022: Pt. Is consistently actively able to release linens once they are in the desired position over him. 11/16/2022: Pt. continues to improve with actively releasing blankets form his hands. 09/26/2022: Pt. is improving with actively releasing blankets form his hands. 08/15/2022: Pt. has difficulty with controlled releasing of blankets/linens from his grasp.     Goal status: Achieved    10. Pt. Will improve bilateral UE strength by 2 MM grades to assist with ADLs, and IADLs  Baseline: 01/09/2023: Continue 12/28/2022: Right: shoulder flexion: 3/5, abduction: 4/5, elbow flexion: 4+/5 , extension: 4-/5 wrist extension: 2/5; Left: shoulder flexion: 4+/5, abduction: 4+/5, elbow flexion: 5/5 , extension: 4/5  wrist extension: 3/5 Right: shoulder flexion: 3+/5, abduction: 4/5, elbow flexion: 4+/5 , extension: 4+/5 wrist extension: 2/5; Left: shoulder flexion: 4+/5, abduction: 4+/5, elbow flexion: 5/5 , extension: 4+/5 wrist extension: 3-/5 11/16/2022: Right: shoulder flexion: 3/5, abduction: 4/5, elbow flexion: 4+/5 , extension: 4+/5 wrist extension: 2/5; Left:  shoulder flexion: 4+/5, abduction: 4+/5, elbow flexion: 5/5 , extension:  wrist extension: 3-/5 Right: shoulder flexion: 3+/5, abduction: 4/5, elbow flexion: 4+/5 , extension: 4+/5 wrist extension: 2/5; Left: shoulder flexion: 4+/5, abduction: 4+/5, elbow flexion: 5/5 , extension: 4+/5 wrist extension: 3-/5 Goal status: Ongoing  11. Pt. Will improve right wrist extension by 10 degrees to initiate reaching for items in preparation for ADLs. Baseline: 01/09/2023: Continue 12/28/2022: -22(34) 11/16/2022: -30(10)    Goal status: Progressing   ASSESSMENT: CLINICAL IMPRESSION:  Pt. reports feeling much better after being sick last week. Pt. tolerated ROM well without reports of pain. Pt. continues to present with increased tone, and tightness in the BUEs in the forearm pronators limiting supination,  2nd through 5th digit PIP, and DIP flexion limiting extension when releasing items. Pt. required visual, tactile, and verbal cues for theraputty exercises. Pt. continues to be highly motivated to continue to work towards  improving BUE functioning, ROM, strength, motor control, [facility] skills, as well as visual compensatory strategies in order to maximize independence with daily ADLs, and IADL functioning.      PERFORMANCE DEFICITS in functional skills including ADLs, IADLs, coordination, dexterity, proprioception, ROM, strength, pain, FMC, GMC, decreased knowledge of use of DME, and UE functional use, cognitive skills including safety awareness, and psychosocial skills including coping strategies, environmental adaptation, habits, and routines and behaviors.   IMPAIRMENTS are limiting patient from  ADLs, IADLs, leisure, and social participation.   COMORBIDITIES may have co-morbidities  that affects occupational performance. Patient will benefit from skilled OT to address above impairments and improve overall function.  MODIFICATION OR ASSISTANCE TO COMPLETE EVALUATION: Maximum or significant modification of  tasks or assist is necessary to complete an evaluation.  OT OCCUPATIONAL PROFILE AND HISTORY: Comprehensive assessment: Review of records and extensive additional review of physical, cognitive, psychosocial history related to current functional performance.  CLINICAL DECISION MAKING: High - multiple treatment options, significant modification of task necessary  REHAB POTENTIAL: Fair    EVALUATION COMPLEXITY: High    PLAN: OT FREQUENCY: 2 x's a week  OT DURATION:12 weeks  PLANNED INTERVENTIONS: self care/ADL training, therapeutic exercise, therapeutic activity, neuromuscular re-education, manual therapy, passive range of motion, patient/family education, and cognitive remediation/compensation RECOMMENDED OTHER SERVICES: PT  CONSULTED AND AGREED WITH PLAN OF CARE: Patient and family member/caregiver  PLAN FOR NEXT SESSION: Initiate treatment  Olegario Messier, MS, OTR/L   01/16/23, 5:17 PM

## 2023-01-18 ENCOUNTER — Ambulatory Visit: Payer: BC Managed Care – PPO | Admitting: Occupational Therapy

## 2023-01-18 ENCOUNTER — Ambulatory Visit: Payer: BC Managed Care – PPO

## 2023-01-18 DIAGNOSIS — J069 Acute upper respiratory infection, unspecified: Secondary | ICD-10-CM | POA: Diagnosis not present

## 2023-01-20 DIAGNOSIS — L8994 Pressure ulcer of unspecified site, stage 4: Secondary | ICD-10-CM | POA: Diagnosis not present

## 2023-01-20 DIAGNOSIS — R32 Unspecified urinary incontinence: Secondary | ICD-10-CM | POA: Diagnosis not present

## 2023-01-20 DIAGNOSIS — I6389 Other cerebral infarction: Secondary | ICD-10-CM | POA: Diagnosis not present

## 2023-01-22 DIAGNOSIS — G4733 Obstructive sleep apnea (adult) (pediatric): Secondary | ICD-10-CM | POA: Diagnosis not present

## 2023-01-23 ENCOUNTER — Ambulatory Visit: Payer: BC Managed Care – PPO | Admitting: Occupational Therapy

## 2023-01-23 ENCOUNTER — Ambulatory Visit: Payer: BC Managed Care – PPO

## 2023-01-25 ENCOUNTER — Ambulatory Visit: Payer: BC Managed Care – PPO

## 2023-01-25 ENCOUNTER — Ambulatory Visit: Payer: BC Managed Care – PPO | Admitting: Occupational Therapy

## 2023-01-25 DIAGNOSIS — R269 Unspecified abnormalities of gait and mobility: Secondary | ICD-10-CM | POA: Diagnosis not present

## 2023-01-25 DIAGNOSIS — I6930 Unspecified sequelae of cerebral infarction: Secondary | ICD-10-CM | POA: Diagnosis not present

## 2023-01-25 DIAGNOSIS — M6281 Muscle weakness (generalized): Secondary | ICD-10-CM

## 2023-01-25 DIAGNOSIS — R278 Other lack of coordination: Secondary | ICD-10-CM

## 2023-01-25 DIAGNOSIS — R262 Difficulty in walking, not elsewhere classified: Secondary | ICD-10-CM | POA: Diagnosis not present

## 2023-01-25 DIAGNOSIS — R2681 Unsteadiness on feet: Secondary | ICD-10-CM | POA: Diagnosis not present

## 2023-01-25 NOTE — Therapy (Signed)
Occupational Therapy Neuro Treatment Note  Patient Name: Paul Hall MRN: [id number] DOB:March 23, 1996, 27 y.o., male Today's Date: 01/25/2023  PCP: Dr. Sherwood Gambler REFERRING PROVIDER: Dr. Sherwood Gambler   OT End of Session - 01/25/23 1709     Visit Number [id number]    Number of Visits 84    Date for OT Re-Evaluation 02/08/23    Authorization Type 09/26/2022    OT Start Time 1615    OT Stop Time 1700    OT Time Calculation (min) 45 min    Equipment Utilized During Treatment tilt in space power wc    Activity Tolerance Patient tolerated treatment well    Behavior During Therapy WFL for tasks assessed/performed                  Past Medical History:  Diagnosis Date   Diabetes mellitus (HCC)    Hypertension    Stroke (HCC)    Past Surgical History:  Procedure Laterality Date   IVC FILTER PLACEMENT ([facility] HX)     LEG SURGERY     PEG TUBE PLACEMENT     TRACHEOSTOMY     Patient Active Problem List   Diagnosis Date Noted   Sepsis due to vancomycin resistant Enterococcus species (HCC) 06/06/2019   SIRS (systemic inflammatory response syndrome) (HCC) 06/05/2019   Acute lower UTI 06/05/2019   VRE (vancomycin-resistant Enterococci) infection 06/05/2019   Anemia 06/05/2019   Skin ulcer of sacrum with necrosis of muscle (HCC)    Urinary retention    Type 2 diabetes mellitus without complication, with long-term current use of insulin (HCC)    Tachycardia    Lower extremity edema    Acute metabolic encephalopathy    Obstructive sleep apnea    Morbid obesity with BMI of 60.0-69.9, adult (HCC)    Goals of care, counseling/discussion    Palliative care encounter    Sepsis (HCC) 04/27/2019   H/O insulin dependent diabetes mellitus 04/27/2019   History of CVA with residual deficit 04/27/2019   Seizure disorder (HCC) 04/27/2019   Decubitus ulcer of sacral region, stage 4 (HCC) 04/27/2019   ONSET DATE: 01/2019  REFERRING DIAG: CVA/COVID-19  THERAPY DIAG:  Muscle weakness  (generalized)  Rationale for Evaluation and Treatment Rehabilitation  SUBJECTIVE:   SUBJECTIVE STATEMENT:  Pt. reports he decided that he would like to be a therapist. .  Pt accompanied by: self and family member  PERTINENT HISTORY:  Pt. is a 27 y.o. male who was diagnosed with COVID-19, and CVA with resultant quadriplegia. Pt. Was then hospitalized with VRE UTI. PMHx includes: urinary retention, seizure disorder, obstructive sleep disorder, DM Type II, Morbid obesity.   PRECAUTIONS: None  WEIGHT BEARING RESTRICTIONS No  PAIN:  Are you having pain? No reports of pain  FALLS: Has patient fallen in last 6 months? No  LIVING ENVIRONMENT: Lives with: lives with their family Lives in: House/apartment Stairs: No Level Entry Has following equipment at home: Wheelchair (power) and hoyer lift, sit to stand lift  PLOF: Independent  PATIENT GOALS: To be able to engage in more daily care tasks.  OBJECTIVE:   HAND DOMINANCE: Right  ADLs:  Transfers/ambulation related to ADLs: Eating: Pt. reports being able to hold standard utensils, and is starting to engage more in self-feeding tasks, hand to mouth patterns. Pt. Reports that he does as much as he can with the task, and family assists  with the remainder of the task. Grooming: Pt. Is able to initiate holding an electric toothbrush, and brush his  teeth. Family assists LB Dressing: Total Assist UE dressing: Pt. is now able to reach up to actively assist with grasping , and pulling his gown down. Toileting:  Total Assist Bathing: MaxA UB, Total assist LB Tub Shower transfers: N/A Equipment: See above    IADLs: Shopping: Relies on family to assist Light housekeeping: Total Assist Meal Prep: Total Assist Community mobility:   Medication management:  Total Assist  Financial management: N/A Handwriting: Not legible: Pt. Is able to hold a pen with the left hand, and initiate marking the page. Pt.'s eye glasses were not  available  MOBILITY STATUS:  Power w/c  POSTURE COMMENTS:  Pt. Requires position changes in his power w/c  ACTIVITY TOLERANCE: Activity tolerance:  Fair  FUNCTIONAL OUTCOME MEASURES: FOTO: 40  TR score: 45  05/25/2022:   FOTO: 50 TR score: 45  07/20/22: FOTO 55  UPPER EXTREMITY ROM     Active ROM Left eval Left  03/21/2022 Left 05/25/2022 L  07/20/22 Left 07/25/2022 Left  08/15/2022 Left  09/26/2022 Left  11/16/2022 Left 12/28/2022  Shoulder flexion 119 123 125 130  136 138 130 142  Shoulder abduction 110 115 115 115  118 121 125 142  Shoulder adduction           Shoulder extension           Shoulder internal rotation           Shoulder external rotation           Elbow flexion 135(135) 145 145 145  145     Elbow extension -27(-18) -21(-20) -20(-16)  -25(-10) -23(-10) -21(-10) -20(-18) -20(-18)  Wrist flexion           Wrist extension 10(50) 19(50) 28(50)  30(50) 35(55) 36(55) 36(60) 40(60)  Wrist ulnar deviation           Wrist radial deviation           Wrist pronation           Wrist supination   Limited by flexor tone        (Blank rows = not tested)    Active ROM Right eval Right 03/21/2022 Right 05/25/2022 R  07/20/22 Right  07/25/22 Right 08/15/2022 Right 09/26/2022 Right 11/16/2022 Right 12/28/2022  Shoulder flexion 106 scaption 105  Scaption 62 flexion 110 scaption 100  105 105 105 112 Scaption  Shoulder abduction 114 90 97 100  105 117 120 124  Shoulder adduction           Shoulder extension           Shoulder internal rotation           Shoulder external rotation           Elbow flexion 120(130) 145 145 140  144 140 142 148  Elbow extension -45(-35) -22(-35) -28(-22)  -32(-21) -32(-28) -28(-26) -28(-28) -27(-26)  Wrist flexion           Wrist extension -30(10) -25(10) -10(30)  -10(20) -10(25) -20(40) -30(10) -22(34)  Wrist ulnar deviation           Wrist radial deviation           Wrist pronation           Wrist supination   Limited by flexor tone           12/28/2022: Thumb radial abduction: Right: 12(46), Left: 40(54)  Digits: Bilateral 2nd through 5th digit: MP Hyperextension with PIP, an DIP flexion  UPPER EXTREMITY MMT:     Left eval Left 03/21/2022 Left  05/25/2022 L 07/20/22 Left  08/15/2022 Left 09/26/2022 Left 11/16/2022 Left 12/28/2022  Shoulder flexion 3-/5 3/5 3+/5 4+/5 4+/5 4+/5 4+/5 4+/5  Shoulder abduction 3-/5 3-/5 4-/5 4+/5 4+/5 4+5 4+/5 4+/5  Shoulder adduction          Shoulder extension          Shoulder internal rotation          Shoulder external rotation          Elbow flexion 3/5 3+/5 4-/5 4+/5 5/5 5/5 5/5 5/5  Elbow extension 2-/5 2/5  4+/5 4+/5 4+/5  4/5  Wrist flexion          Wrist extension 2/5 2+/5 3-/5  3-/5 3-/5 3-/5 3/5  Wrist ulnar deviation          Wrist radial deviation          Wrist pronation          Wrist supination          (Blank rows = not tested)  Right eval Right 03/21/2022 Right  05/25/2022 R 07/20/22 Right 08/15/2022 Right 09/26/2022 Right 11/16/2022 Right 12/28/2022  Shoulder flexion 3-/5 scaption 3-/5 3-/5 3+/5 3+/5 3+/5 3/5 3/5  Shoulder abduction 3-/5 3-/5 3-/5 4/5 4/5 4/5 4/5 4/5  Shoulder adduction          Shoulder extension          Shoulder internal rotation          Shoulder external rotation          Elbow flexion 3/5 3/5 3+/5 4/5 4+/5 4+/5 4+/5 4+/5  Elbow extension 2-/5 2/5 3-/5 4+/5 4+/5 4+/5 4+/5 4-/5  Wrist flexion          Wrist extension 2-/5 2-/5 2/5 2/5 2/5 2/5 2/5 2/5  Wrist ulnar deviation          Wrist radial deviation          Wrist pronation          Wrist supination            HAND FUNCTION:  Grip strength: Right: 0 lbs; Left: 0 lbs and Lateral pinch: Right: 5 lbs, Left: 2 lbs  03/21/2022: Lateral pinch: Right: 3.5 lbs, Left: 2 lbs  07/20/22: Grip strength: Right: 3 lbs; Left: 4 lbs and Lateral pinch: Right: 5 lbs, Left: 3 lbs  08/15/2022:  Grip strength: Right: 0 lbs; Left: 5 lbs and Lateral pinch: Right: 2 lbs, Left: 4  lbs  09/26/2022  Grip strength: Right: 0 lbs; Left: 8 lbs and Lateral pinch: Right: 2 lbs, Left: 5 lbs  11/16/2022  Grip strength: Right: 0 lbs; Left: 8 lbs  9/25/20204  Grip strength: Right: 0 lbs; Left: 8 lbs    Bilateral digit PIP/DIP flexion contractures with MP hyperextension with attempts for AROM. Pt. is able to tolerate AROM to the bilateral digits at the initial evaluation however, has a history of pain in the digits.  COORDINATION: Eval: Pt. is unable to grasp 9-hole test pegs. Pt. is able to initiate grasping larger pegs, and is able to hold a pen in the left hand.  07/20/22: 2 min 36 seconds to remove 9 pegs from 9 hole peg test - cues to locate pegs 2/2 low vision. Pt. is able to initiate grasping larger pegs on R hand and is able to hold a pen in the left hand.  08/15/2022:  Vision, and sensation limiting accuracy of 9 hole peg test results. Pt. Was able to grasp and remove vertical pegs intermittently with cues.    SENSATION: Light touch: Impaired   EDEMA:  N/A  MUSCLE TONE: BUE flexor Spasticity  COGNITION Overall cognitive status: Continue to assess in functional context  VISION:   Subjective report: Pt. was not wearing glasses at the time of the initial eval.  Baseline vision: Vision is very limited. Wears glasses all the time Visual history: History of impaired vision following CVA. Pt. Has received treatment through the [facility] low vision rehabilitation program.   VISION ASSESSMENT: Impaired To be further assessed in functional context  PERCEPTION: Impaired   PRAXIS: Impaired: motor planning  OBSERVATIONS:  Pt reports being on Tramadol   TODAY'S TREATMENT   Therapeutic Ex.:  Pt. performed AROM for right shoulder flexion, abduction. Emphasis was placed on slow prolonged gentle passive stretching for RUE, forearm supination, bilateral wrist extension, wrist extension, digit MP, PIP, and DIP extension following moist heat. ROM was performed to  decrease stiffness, and prepare the RUE for functional use. ROM was performed following moist heat to the shoulder right elbow, forearm, wrist, and digits. PROM for Left digit MP, PIP, and DIP extension.    PATIENT EDUCATION: Education details: Doctor, general practice and fork positioning for self feeding Person educated: Patient and Parent Education method: Explanation, Demonstration, Tactile cues, and Verbal cues Education comprehension: verbalized understanding, returned demonstration, verbal cues required, and needs further education  HOME EXERCISE PROGRAM:  Continue ongoing assessment, and continue to provide as needed.   GOALS: Goals reviewed with patient? Yes  SHORT TERM GOALS: Target date: 11/07/2022   To assess splint fit, and make appropriate adjustments to promote good skin integrity through the palmar surface of the bilateral hands.  Baseline: 05/25/22: Goal currently met, however ongoing as needs to assess splint fit arise. 03/23/2022: Pt. is wearing splints a couple of hours at night bilateral resting hand splints. 03/21/2022: Pt. is wearing splints a couple of hours at night bilateral resting hand splints. Goal status: Deferred   LONG TERM GOALS: Target date: 02/08/2023    FOTO score will Improve by 2 points for Pt. perceived improvement with the assessment specific ADL/IADL tasks.  Baseline: 12/28/2022: FOTO score: 56; 11/16/2022: 52 09/27/2022: 51 07/20/2022: FOTO 55 05/25/2022: FOTO score: 50,  TR score: 45 Eval: FOTO score: 40,  TR score: 45 Goal status: Achieved   2.   Pt. will independently perform oral care for 100% of the task after complete set-up. Baseline: 01/09/2023: Continue 12/28/2022:  Pt. Continues to complete 90% of the task with complete set-up, with visual cues, and occassional assist of the right elbow. 11/16/2022: Pt. Completes 90% of the task with complete set-up. 09/26/2022: Completes 90% of the task, proximal assist at times required at the elbow.  08/15/2022: Completes  90% of the task, proximal assist at times required at the elbow. 07/20/22: completes 90% of task, limited by shoulder flexion. 05/25/2022:  Pt. Is able to initiate and perform oral care for approximately 90% of the task. Complete set-up required. Assi needed only for the very back teeth. 03/23/2022: Pt. Is able to initiate and perform oral care for approximately 75% of the task. Pt. Requires assist at proximally at the elbow for through oral care. 03/21/2022: Pt. Is able to initiate and perform oral care for approximately 75% of the task. Pt. Requires assist at proximally at the elbow for through oral care. Eval: Pt. is able to initiate using an  electric toothbrush. Pt. requires assist for set-up, and assist for thoroughness, and as he Pt. fatigues. Goal status: Ongoing  3.  Pt. Will be modified independence with self-feeding for 100% using a swivel spoon, and standard fork after complete set-up Baseline: 01/09/2023: Continue 12/28/2022: Pt. Is now feeding himself 90% of the time using a swivel spoon with the left hand, and 95% of the time with the fork. Pt. Continues to have difficulty with cutting food. 11/16/2022: 100% with finger foods, Pt. Is initiating with a swivel spoon, and universal cuff for fork use, however requires assist for consistency, and accuracy.  09/26/2022: 90% using the spoon, assist at time with the forearm motion, and grip on the utensils. 100% for finger foods.  08/15/2022:  90% using the spoon, assit at time with the forearm motion, and grip on the utensils. 100% for finger foods-independent with set-up- unsupervised.  07/20/22: self-feeds cereal using spoon 90% of task. 05/25/2022: Pt. Is able to use a spoon to scoop cereal when feeding himself cereal 85% of the time. Pt. Is able to feed himself snack/finger foods 100% of the time. Pt. Continues to work on consistency of  stabilizing a cup/mug when drinking. Pt. Is able to grasp a water bottle with assist initially, with assist tapering off as  he drinks.03/23/2022: Pt. Is able to perform scooping cereal for 75% of the time. Pt. required assist, and support at the left elbow, and Pt. Presents with limited forearm supination when using the spoon, and bringing it towards his mouth. Pt. Is able to use a fork to spear items, and perform the hand to mouth pattern.  03/21/2022: Pt. Is able to perform scooping cereal for 75% of the time. Pt. required assist, and support at the left elbow, and Pt. presents with limited forearm supination when using the spoon, and bringing it towards his mouth. Pt. Is able to use a fork to spear items, and perform the hand to mouth pattern.  Eval: Pt. is able to hold standard standard utensils. Pt. Performs as much of the task as he, can and has assistance for the remainder. Goal status:  Met/Revised to 100% of the meal with a swivel spoon, and standard fork.  4.  Pt. Will improve grasp patterns and consistently grasp 1/4" objects for ADL, and IADL tasks.  Baseline: 01/09/2023: Continue 12/28/2022: Continues to grasp 1/4" pegs with min a + visual cues, consistently grasping 1/2" objects with visual cues 11/16/2022: Continues to grasp 1/4" pegs with min a + visual cues, consistently grasping 1/2" objects with visual cues 09/26/2022: Continues to grasp 1/4" pegs with min a + visual cues, consistently grasping 1/2" objects with visual cues. 08/15/2022: grasps 1/4" pegs with min a + visual cues, consistently grasping 1/2" objects with visual cues. 07/20/22: grasps 1/4" pegs with min a + visual cues, consistently grasping 1/2" objects with visual cues. 05/25/2022: Pt. Is working on improving consistency of grasping 1/2" objects with visual cues.  03/23/2022: Pt. Is able to grasp 1" objects consistently,and continues to work on the hand patterns needed to grasp 1/2" objects.03/21/2022: Pt. Is able to grasp 1" objects consistently,and continues to work on the hand patterns needed to grasp 1/2" objects. Eval: Pt. is able to grasp 1" objects  intermittently using a lateral grasp pattern. Goal status: Ongoing  5.  Pt. will independently write his name legibly with letter sizes under 1". Baseline: 01/09/2023: Continue. 12/28/2022: Pt. is now using an IPad Pen. 11/16/2022: Pt. Continues to be able to write name with  smaller letter size. Pt. Is signing name with a computer styus. Pt. Requires visual cues, and assist 09/26/2022: Pt. Continues to be able to write name with smaller letter size. Pt. Is signing name with a computer styus. Pt. Requires visual cues, and assist. 08/15/2022: Pt. Is able to write name with smaller letter size. Pt. Is signing name with a computer styus. Pt. Requires visual cues, and assist. 07/20/22: stabilizing assist to write name with 75% legibility with 2" letters. 05/25/2022: Pt. to continue to work towards formulating grasp patterns in preparation for grasping a large width pen. Pt. Requires visual cues. 03/23/2022: Pt. Is able to write his name with modA, however has difficulty with formulating letter sizes less than 2" in size with 50% legibility for the 3 letters of his name.03/21/2022: Pt. Is able to write his name with modA, however has difficulty with formulating letter sizes less than 2" in size with 50% legibility for the 3 letters of his name. Eval: Pt is able to hold a thin marker with his left hand, and formulate a line, and initiate a circular pattern (Pt. without glasses today) Goal status: Ongoing  6. Pt. Will reach up to comb/brush his hair with minA.  Baseline: 01/09/2023: Continue 12/28/2022: Pt. Is progressing, and is now able to use a hair pick with the left hand on the right side of his head, top, and back of the head. Pt. Continues to have difficulty reaching further to the right side of his head with the left. 11/16/2022: Pt. Is able to use a hair pick for the left side of his head using his left hand. Pt. Presents with difficulty reaching to the right side of his head.09/26/2022: Pt. Is able to use a hair  pick for the left side of his head using his left hand. Pt. Presents with difficulty reaching to the right side of his head. 5/13/202: 75% using a hair pick, assist to the right side of the head. 07/20/22: reaches 75% of head, assist for far R side of head, fatigues quickly. 05/25/2022: Pt. Is able to reach up with the left hand to the left side, top, and back of his head. 03/23/2022: Pt. is now able to more consistently initiate reaching up to his head with his left hand in preparation for haircare12/18/2023: Pt. is now able to more consistently initiate reaching up to his head with his left hand in preparation for haircare. Eval: Pt. is able to initiate reaching up for hair care with a long handled brush, however is unable to sustain UE's in elevation to perform the task.     Goal status: Progressing/Ongoing  7. Pt. Will independently navigate the w/c through his environment with minA with visual scanning, and hand placement on the controls.  Baseline: 11/16/2022: Deferred 2/2 vision 09/26/2022: Pt. Is now navigating his w/c ouside the home, and around his block. 08/15/2022: Pt. Requires minA to setup hand on controls and MIN cues to navigate the w/c in wide spaces, requires MIN - MOD cues to navigate the w/c through more narrow doorways, and tighter turns - varies based on good vs bad vision days.07/20/22: Pt. Requires minA to setup hand on controls and MIN cues to navigate the w/c in wide spaces, requires MIN - MOD cues to navigate the w/c through more narrow doorways, and tighter turns - varies based on good vs bad vision days. 05/25/2022: Pt. Requires minA  and  cues to navigate the w/c in wide spaces, and requires Mod cues to navigate the  w/c through more narrow doorways, and tighter turns. Pt. Requires max cues for scanning through the environment, and moderate cues for hand placement on the controls.     Goal status: Deferred 2/2 vision    8. Pt. Will improve bilateral grip strength to be able to  independently grasp, and pull up blankets, and linens.   Baseline: 01/09/2023: Continue 12/28/2023: R: 0#, L: 5# Pt. Is now able to more consistently grasp and pull blankets up over him. 11/16/2022: R: 0 L: 8# Pt. Is more consistently grasping his blankets and attempting to pull them up. 09/26/2022: R: 0  L: 8# Pt. is attempting to grasp, and pull blankets up more, using mostly the left hand. 08/15/2022: Pt. has difficulty securely holding, and pulling up blankets, and linens.    Goal status: Partially met  9. Pt. will consistently actively control the releasing  of blankets, covers, and linens from his hands once they are in the desired position over him. Baseline:12/28/2022: Pt. Is consistently actively able to release linens once they are in the desired position over him. 11/16/2022: Pt. continues to improve with actively releasing blankets form his hands. 09/26/2022: Pt. is improving with actively releasing blankets form his hands. 08/15/2022: Pt. has difficulty with controlled releasing of blankets/linens from his grasp.     Goal status: Achieved    10. Pt. Will improve bilateral UE strength by 2 MM grades to assist with ADLs, and IADLs  Baseline: 01/09/2023: Continue 12/28/2022: Right: shoulder flexion: 3/5, abduction: 4/5, elbow flexion: 4+/5 , extension: 4-/5 wrist extension: 2/5; Left: shoulder flexion: 4+/5, abduction: 4+/5, elbow flexion: 5/5 , extension: 4/5  wrist extension: 3/5 Right: shoulder flexion: 3+/5, abduction: 4/5, elbow flexion: 4+/5 , extension: 4+/5 wrist extension: 2/5; Left: shoulder flexion: 4+/5, abduction: 4+/5, elbow flexion: 5/5 , extension: 4+/5 wrist extension: 3-/5 11/16/2022: Right: shoulder flexion: 3/5, abduction: 4/5, elbow flexion: 4+/5 , extension: 4+/5 wrist extension: 2/5; Left: shoulder flexion: 4+/5, abduction: 4+/5, elbow flexion: 5/5 , extension:  wrist extension: 3-/5 Right: shoulder flexion: 3+/5, abduction: 4/5, elbow flexion: 4+/5 , extension: 4+/5 wrist  extension: 2/5; Left: shoulder flexion: 4+/5, abduction: 4+/5, elbow flexion: 5/5 , extension: 4+/5 wrist extension: 3-/5 Goal status: Ongoing  11. Pt. Will improve right wrist extension by 10 degrees to initiate reaching for items in preparation for ADLs. Baseline: 01/09/2023: Continue 12/28/2022: -22(34) 11/16/2022: -30(10)    Goal status: Progressing   ASSESSMENT:  CLINICAL IMPRESSION:  Pt. arrived to therapy with his father, reporting that his mother is still not feeling great from the lingering cold that they caught earlier in the month. Pt. is responding well to moist heat, and stretching. Pt. continues to present with increased tone, and tightness in the BUEs in the forearm pronators limiting supination,  2nd through 5th digit PIP, and DIP flexion limiting extension when releasing items. Pt. required visual, tactile, and verbal cues for theraputty exercises. Pt. continues to be highly motivated to continue to work towards  improving BUE functioning, ROM, strength, motor control, [facility] skills, as well as visual compensatory strategies in order to maximize independence with daily ADLs, and IADL functioning.      PERFORMANCE DEFICITS in functional skills including ADLs, IADLs, coordination, dexterity, proprioception, ROM, strength, pain, FMC, GMC, decreased knowledge of use of DME, and UE functional use, cognitive skills including safety awareness, and psychosocial skills including coping strategies, environmental adaptation, habits, and routines and behaviors.   IMPAIRMENTS are limiting patient from ADLs, IADLs, leisure, and social participation.   COMORBIDITIES may  have co-morbidities  that affects occupational performance. Patient will benefit from skilled OT to address above impairments and improve overall function.  MODIFICATION OR ASSISTANCE TO COMPLETE EVALUATION: Maximum or significant modification of tasks or assist is necessary to complete an evaluation.  OT OCCUPATIONAL PROFILE AND  HISTORY: Comprehensive assessment: Review of records and extensive additional review of physical, cognitive, psychosocial history related to current functional performance.  CLINICAL DECISION MAKING: High - multiple treatment options, significant modification of task necessary  REHAB POTENTIAL: Fair    EVALUATION COMPLEXITY: High    PLAN: OT FREQUENCY: 2 x's a week  OT DURATION:12 weeks  PLANNED INTERVENTIONS: self care/ADL training, therapeutic exercise, therapeutic activity, neuromuscular re-education, manual therapy, passive range of motion, patient/family education, and cognitive remediation/compensation RECOMMENDED OTHER SERVICES: PT  CONSULTED AND AGREED WITH PLAN OF CARE: Patient and family member/caregiver  PLAN FOR NEXT SESSION: Initiate treatment  Olegario Messier, MS, OTR/L   01/25/23, 5:14 PM

## 2023-01-26 NOTE — Therapy (Signed)
OUTPATIENT PHYSICAL THERAPY TREATMENT/Physical Therapy Progress Note   Dates of reporting period  11/23/2022   to   01/26/2023     Patient Name: Paul Hall MRN: [id number] DOB:06-18-1995, 27 y.o., male Today's Date: 01/26/2023  PCP:  Cindi Carbon PROVIDER:  Lenise Herald, PA-C   PT End of Session - 01/26/23 1010     Visit Number 130    Number of Visits 146    Date for PT Re-Evaluation 02/22/23    Authorization Type [employer] COMM Pro/ [place] [employer]    Progress Note Due on Visit 140    PT Start Time 1532    PT Stop Time 1612    PT Time Calculation (min) 40 min    Equipment Utilized During Treatment Gait belt    Activity Tolerance Patient tolerated treatment well    Behavior During Therapy WFL for tasks assessed/performed                     Past Medical History:  Diagnosis Date   Diabetes mellitus (HCC)    Hypertension    Stroke (HCC)    Past Surgical History:  Procedure Laterality Date   IVC FILTER PLACEMENT ([facility] HX)     LEG SURGERY     PEG TUBE PLACEMENT     TRACHEOSTOMY     Patient Active Problem List   Diagnosis Date Noted   Sepsis due to vancomycin resistant Enterococcus species (HCC) 06/06/2019   SIRS (systemic inflammatory response syndrome) (HCC) 06/05/2019   Acute lower UTI 06/05/2019   VRE (vancomycin-resistant Enterococci) infection 06/05/2019   Anemia 06/05/2019   Skin ulcer of sacrum with necrosis of muscle (HCC)    Urinary retention    Type 2 diabetes mellitus without complication, with long-term current use of insulin (HCC)    Tachycardia    Lower extremity edema    Acute metabolic encephalopathy    Obstructive sleep apnea    Morbid obesity with BMI of 60.0-69.9, adult (HCC)    Goals of care, counseling/discussion    Palliative care encounter    Sepsis (HCC) 04/27/2019   H/O insulin dependent diabetes mellitus 04/27/2019   History of CVA with residual deficit 04/27/2019   Seizure disorder (HCC) 04/27/2019   Decubitus ulcer  of sacral region, stage 4 (HCC) 04/27/2019    REFERRING DIAG: Cerebral infarction, unspecified   THERAPY DIAG:  Muscle weakness (generalized)  Other lack of coordination  History of CVA with residual deficit  Abnormality of gait and mobility  Difficulty in walking, not elsewhere classified  Unsteadiness on feet  Rationale for Evaluation and Treatment Rehabilitation  PERTINENT HISTORY: Paul Hall is a 26yoM who presents with severe weakness, quadriparesis, altered sensorium, and visual impairment s/p critical illness and prolonged hospitalization. Pt hospitalized in October 2020 with ARDS 2/2 COVID19 infection. Pt sustained a complex and lengthy hospitalization which included tracheostomy, prolonged sedation, ECMO. In this period pt sustained CVA and SDH. Pt has now been liberated from tracheostomy and G-tube. Pt has since been hospitalized for wound infection and UTI. Pt lives with parents at home, has hospital bed and left chair, hoyer lift transfers, and power WC for mobility needs. Pt needs heavy physical assistance with ADL 2/2 BUE contractures and motor dysfunction   PRECAUTIONS: Fall  SUBJECTIVE:  Patient reports still recovering from being sick and requested not to stand today- reports feeling very tight and requested to work on more stretching.     PAIN:  Are you having pain? No   TODAY'S TREATMENT:  -  01/25/2023   THEREX: (all therex performed in power wc)   Ankle stretching- DF into PF, Ankle circles- prolonged gastroc/achilles stretch- hold 30 sec x3.  Knee flex/ext x several min Hip flex/abd and circles- CW/CCW- x 20+ B shoulder end range stretching Active ankle DF/PF x 15 reps Active knee ext/flex x 15 reps each LE Active hip march in chair x 10 reps each LE      PATIENT EDUCATION: Education details: Progression of intensity at home with trunk strength exercises and how to grade intensity using a goniometer bubble app.   HOME EXERCISE  PROGRAM:  Access Code: [email or web address] URL: [email or web address] Date: 03/23/2022 Prepared by: Maureen Ralphs  Exercises - Supine Bridge  - 3 x weekly - 3 sets - 10 reps - 2 hold - Supine Gluteal Sets  - 3 x weekly - 3 sets - 10 reps - 5 sec hold - Supine Quad Set  - 1 x daily - 3 x weekly - 3 sets - 10 reps - 5 hold - Seated Long Arc Quad  - 1 x daily - 7 x weekly - 3 sets - 10 reps - Seated Hip Adduction Squeeze with Ball  - 1 x daily - 3 x weekly - 3 sets - 10 reps - 5 hold - Seated Hip Abduction  - 1 x daily - 3 x weekly - 3 sets - 10 reps - 2 hold    PT Short Term Goals -       PT SHORT TERM GOAL #1   Title Pt will be independent with HEP in order to improve strength and balance in order to decrease fall risk and improve function at home and work.    Baseline 01/04/2021= No formal HEP in place; 12/12 no HEP in place; 05/10/2021-Patient and his father were able to report compliance with curent HEP consisting of mostly seated/reclined LE strengthening. Both verbalize no questions at this time.    Time 6    Period Weeks    Status Achieved    Target Date 02/15/21            PT LONG TERM GOAL #1     Title Patient will increase BLE gross strength by 1/2 muscle grade to improve functional strength for improved independence with potential gait, increased standing tolerance and increased ADL ability.     Baseline 01/04/2021- Patient presents with 1/5 to 3-/5 B LE strength with MMT; 12/12: goal partially met for Left knee/hip; 05/10/2021= 2-/5 bilateal Hip flex; 3+/5 bilateral Knee ext; 06/21/2021= Patient presents with 2-/5 bilateral Hip flex; 3+/5 bilateral knee ext/flex; 2-/5 left ankle DF; 0/5 right ankle- and able to increase reps and resistance with LE's. 09/15/2021- Patient technically presents with 2-/5 B hip flex/abd/add - but he is able to raise his hip up to approx 100 deg which has improved. 3+/5 Bilateral knee ext, 2-/5 left ankle and 0/5 right ankle.  12/08/2021= Patient  able to lift left knee at 110 deg of hip flex; presents with 3+/5 knee ext, 2-/5 left ankle DF and 0/5 right ankle DF, 2-/5 bilateral Hip abd in seated position.    12/6: R: knee 3+/5 ext, 2/5 flexion, left knee 3+/5 extension, 3+/5 flexion, R hip: 2+/5 hip add, 2+/5 hip ABD L hip: 4-/5 hip ABD, 3+/5 hip ADD, 3+/5 hip flexion; 06/06/2022= Patient now presents with 2-/5 right ankle DF/PF;     Time 12     Period Weeks     Status MET    Target Date 03/07/2022  PT LONG TERM GOAL #2    Title Patient will tolerate sitting unsupported demonstrating erect sitting posture for 15 minutes with CGA to demonstrate improved back extensor strength and improved sitting tolerance.     Baseline 01/04/2021- Patient confied to sitting in lift chair or electric power chair with back support and unable to sit upright without physical assistance; 12/12: tolerates <1 minutes upright unsupported sitting. 05/10/2021=static sit with forward trunk lean  in his power wheelchair without back support x approx 3 min. 06/21/2021=Unable to assess today due to patient with acute back pain but on previous visit able to sit x 8 min without back support. 09/15/2021- on last visit- 09/13/2021- patient was able to sit unsupported x 8 min at edge of mat. 10/13/2023 - Patient was able to sit at edge of mat with varying level of assist today from SBA to min A for a total of 20 min. 12/13/2021= Patient demonstrated unsupported sitting at edge of mat for approx 20 min    Time 12     Period Weeks     Status GOAL MET    Target Date 12/08/21          PT LONG TERM GOAL #3    Title Patient will demonstrate ability to perform static standing in // bars > 2 min with Max Assist  without loss of balance and fair posture for improved overall strength for pre-gait and transfer activities.     Baseline 01/04/2021= Patient current uanble to stand- Dependent on hoyer or sit to stand lift for transfers. 05/10/2021=Not appropriate yet- Currently still dependent  with all transfers using hoyer. 06/21/2021= Patient continuing now to focus on LE strengthening to prepare for standing-unable to try today due to acute low back pain-  planning on attempting in new cert period. 09/15/2021- Patient has attempted standing 2x in past two week- max Assist of 2 people - only once was he successful to clearing his bottom from chair - Will continue to be a focus during the new certification. 12/13/2021= Patient has been limited secondary to increased overall low back pain during this certification and will require more time to focus on this goal.  12/6: not assessed this date, will assess at date when 2-3 PTs are present for assistance     Time 12     Period Weeks     Status GOAL not appropriate at this time - may attempt in future once Patient presents with improved overall LE strength.                 PT LONG TERM GOAL #4    Title Pt will improve FOTO score by 10 points or more demonstrating improved perceived functional ability     Baseline FOTO 7 on 10/17; 03/15/21: FOTO 12; 05/10/2021 06/21/2021= 1; 09/15/2021= 9; 12/13/2021= Will issue next visit 12/6: 4; 06/06/2022= Will assess next visit; Goal not appropriate as patient is not ambulatory.     Time 12     Period Weeks     Status WITHDRAWN    Target date 11/30/2022         PT LONG TERM GOAL #5    Title Patient will perform sit to stand transfer with appropriate AD and max assist of 2 people with 75% consistency to prepare for pregait activities.     Baseline 09/15/2021= Patient unable to stand well- unable to clear his bottom off chair with Max assist of 2 persons. 12/13/2021- Goal not appropriate to try yet but will keep  and roll over to next cert as shift continues to focus on transfers/standing; 4/24= Patient able to perform active ankle DF/PF with right LE and able to raise his knee into seated march and clear floor without physical assist today - as previously unable as well as lift right knee ext to near full ROM to  improve strength for eventual standing. 09/07/2022- Goal continues to not be appropriate but patient is now standing some and will keep goal active; 11/23/2022= patient is now performing sit to stand with max A +2 and now able to clear his bottom off mat. 11/30/2022- Patient continues to perform sit to stand transfers with Max A+2 and able to clear bottom well off mat but not erect yet-     Time 12     Period Weeks     Status PROGRESSING    Target date  02/22/2023       PT LONG TERM GOAL #6  Title Patient will tolerate sitting unsupported demonstrating erect sitting posture for 30 minutes with CGA to demonstrate improved back extensor strength and improved sitting tolerance.   Baseline 12/13/2021= Patient demonstrated unsupported sitting at edge of mat for approx 20 min;  12/29/2021- Patient performed approx 30 min of dynamic sitting activities today. 06/06/2022= Patient demonstrated ability to sit and perform static and dynamic UE/LE movement with only Supervision.   Time 12   Period Weeks   Status GOAL MET           7.  Patient will tolerate 2 minutes or more of standing in sit to stand lift or with max assist +2 in order to indicate improved lower extremity weightbearing tolerance for progression to standing in parallel bars. Baseline: 1 minute on most recent stand 02/21/22; 06/06/2022- Patient did attempt today after complaining of right LE pain last week. He attempted 3 stands using sit to stand lift. - all over 1 min- last one approx 48 sec- stopped due to fatigue. More erect standing in lift today - still poor gluteal strength but able to activate glutes and extend hips upon command but unable to hold > 5 sec. 07/28/2022- Will attempt standing next visit but patient has been able to stand since last progress note for up to 3 min at a time at his best. 09/07/2022-  attempted standing with max +2 and patient able to stand 15-20 sec so will now  revise goal; 11/23/2022- Patient at his best between last 2  visits was able to stand approx 50 sec with max A+2.  11/30/2022- Patient performed 4 trials of static standing - max A +2- clearing bottom and using forearms to bear weight  Goal status: Revised Target date: 02/22/2023   8. Patient will tolerate sitting unsupported demonstrating ability to perform dynamic UE/LE activities for 30 minutes  independently to demonstrate ability to sit at edge of bed to eat or perform some ADL's/exercise for optimal quality of life.             Baseline: 06/06/2022- Patient able to sit and perform some UE/LE exercises but requires CGA at times for safety- he is able to static sit for 30 min with supervision. 07/27/2022= Patient able to now sit > 30 min with Supervision only - performing static and dynamic activities. Will keep goal active to ensure patient is consistent. 09/07/2022=  Patient able to demonstrate consistent ability to sit at edge of mat during visits             Goal Status: MET  Target date: 08/29/2022        Plan     Clinical Impression Statement  Patient due for progress visit but not feeling well today and unable to assess established goal- will reassess next visit. Treatment composed of manual stretching BUE/LE and some active LE strengthening. No significant issues other than weakness today. Patient's condition has the potential to improve in response to therapy. Maximum improvement is yet to be obtained. The anticipated improvement is attainable and reasonable in a generally predictable time.  Pt will continue to benefit from skilled physical therapy intervention to address impairments, improve QOL, and attain therapy goals.    Personal Factors and Comorbidities Comorbidity 3+;Time since onset of injury/illness/exacerbation    Comorbidities CVA, diabetes, Seizures    Examination-Activity Limitations Bathing;Bed Mobility;Bend;Caring for Others;Carry;Dressing;Hygiene/Grooming;Lift;Locomotion Level;Reach Overhead;Self  Feeding;Sit;Squat;Stairs;Stand;Transfers;Toileting    Examination-Participation Restrictions Cleaning;Community Activity;Driving;Laundry;Medication Management;Meal Prep;Occupation;Personal Finances;Shop;Yard Work;Volunteer    Stability/Clinical Decision Making Evolving/Moderate complexity    Rehab Potential Fair    PT Frequency 2x / week    PT Duration 12 weeks    PT Treatment/Interventions ADLs/Self Care Home Management;Cryotherapy;Electrical Stimulation;Moist Heat;Ultrasound;DME Instruction;Gait training;Stair training;Functional mobility training;Therapeutic exercise;Balance training;Patient/family education;Orthotic Fit/Training;Neuromuscular re-education;Wheelchair mobility training;Manual techniques;Passive range of motion;Dry needling;Energy conservation;Taping;Visual/perceptual remediation/compensation;Joint Manipulations    PT Next Visit Plan core strength/motor control in short-sitting, sit to stand if able; Continue with progressive LE Strengthening. Stand activities as appropriate.     PT Home Exercise Plan No changes to HEP today    Consulted and Agree with Plan of Care Patient;Family member/caregiver    Family Member Consulted            10:16 AM, 01/26/23 Louis Meckel, PT Physical Therapist - [facility]  Outpatient Physical Therapy- [facility] [phone]      01/26/2023, 10:16 AM

## 2023-01-30 ENCOUNTER — Ambulatory Visit: Payer: BC Managed Care – PPO

## 2023-01-30 ENCOUNTER — Ambulatory Visit: Payer: BC Managed Care – PPO | Admitting: Occupational Therapy

## 2023-01-30 DIAGNOSIS — M6281 Muscle weakness (generalized): Secondary | ICD-10-CM

## 2023-01-30 DIAGNOSIS — R278 Other lack of coordination: Secondary | ICD-10-CM

## 2023-01-30 DIAGNOSIS — R269 Unspecified abnormalities of gait and mobility: Secondary | ICD-10-CM | POA: Diagnosis not present

## 2023-01-30 DIAGNOSIS — I6930 Unspecified sequelae of cerebral infarction: Secondary | ICD-10-CM | POA: Diagnosis not present

## 2023-01-30 DIAGNOSIS — R262 Difficulty in walking, not elsewhere classified: Secondary | ICD-10-CM

## 2023-01-30 DIAGNOSIS — R2681 Unsteadiness on feet: Secondary | ICD-10-CM | POA: Diagnosis not present

## 2023-01-30 NOTE — Therapy (Signed)
Occupational Therapy Neuro Treatment Note  Patient Name: Paul Hall MRN: [id number] DOB:02-29-1996, 27 y.o., male Today's Date: 01/30/2023  PCP: Dr. Sherwood Gambler REFERRING PROVIDER: Dr. Sherwood Gambler   OT End of Session - 01/30/23 1715     Visit Number [id number]    Number of Visits 84    Date for OT Re-Evaluation 02/08/23    OT Start Time 1623    OT Stop Time 1705    OT Time Calculation (min) 42 min    Equipment Utilized During Treatment tilt in space power wc    Activity Tolerance Patient tolerated treatment well    Behavior During Therapy WFL for tasks assessed/performed                  Past Medical History:  Diagnosis Date   Diabetes mellitus (HCC)    Hypertension    Stroke (HCC)    Past Surgical History:  Procedure Laterality Date   IVC FILTER PLACEMENT ([facility] HX)     LEG SURGERY     PEG TUBE PLACEMENT     TRACHEOSTOMY     Patient Active Problem List   Diagnosis Date Noted   Sepsis due to vancomycin resistant Enterococcus species (HCC) 06/06/2019   SIRS (systemic inflammatory response syndrome) (HCC) 06/05/2019   Acute lower UTI 06/05/2019   VRE (vancomycin-resistant Enterococci) infection 06/05/2019   Anemia 06/05/2019   Skin ulcer of sacrum with necrosis of muscle (HCC)    Urinary retention    Type 2 diabetes mellitus without complication, with long-term current use of insulin (HCC)    Tachycardia    Lower extremity edema    Acute metabolic encephalopathy    Obstructive sleep apnea    Morbid obesity with BMI of 60.0-69.9, adult (HCC)    Goals of care, counseling/discussion    Palliative care encounter    Sepsis (HCC) 04/27/2019   H/O insulin dependent diabetes mellitus 04/27/2019   History of CVA with residual deficit 04/27/2019   Seizure disorder (HCC) 04/27/2019   Decubitus ulcer of sacral region, stage 4 (HCC) 04/27/2019   ONSET DATE: 01/2019  REFERRING DIAG: CVA/COVID-19  THERAPY DIAG:  Muscle weakness (generalized)  Other lack of  coordination  Rationale for Evaluation and Treatment Rehabilitation  SUBJECTIVE:   SUBJECTIVE STATEMENT:  Pt. reports that his w/c tipped backwards in the [place] on the way here this afternoon, reporting that the front of the chair was not secured. Pt. reports that his mother had to reach around, and stabilize his w/c in the [place] until it was properly secured.   Pt accompanied by: self and family member  PERTINENT HISTORY:  Pt. is a 27 y.o. male who was diagnosed with COVID-19, and CVA with resultant quadriplegia. Pt. Was then hospitalized with VRE UTI. PMHx includes: urinary retention, seizure disorder, obstructive sleep disorder, DM Type II, Morbid obesity.   PRECAUTIONS: None  WEIGHT BEARING RESTRICTIONS No  PAIN:  Are you having pain? No reports of pain  FALLS: Has patient fallen in last 6 months? No  LIVING ENVIRONMENT: Lives with: lives with their family Lives in: House/apartment Stairs: No Level Entry Has following equipment at home: Wheelchair (power) and hoyer lift, sit to stand lift  PLOF: Independent  PATIENT GOALS: To be able to engage in more daily care tasks.  OBJECTIVE:   HAND DOMINANCE: Right  ADLs:  Transfers/ambulation related to ADLs: Eating: Pt. reports being able to hold standard utensils, and is starting to engage more in self-feeding tasks, hand to mouth patterns. Pt. Reports  that he does as much as he can with the task, and family assists  with the remainder of the task. Grooming: Pt. Is able to initiate holding an electric toothbrush, and brush his teeth. Family assists LB Dressing: Total Assist UE dressing: Pt. is now able to reach up to actively assist with grasping , and pulling his gown down. Toileting:  Total Assist Bathing: MaxA UB, Total assist LB Tub Shower transfers: N/A Equipment: See above    IADLs: Shopping: Relies on family to assist Light housekeeping: Total Assist Meal Prep: Total Assist Community mobility:   Medication  management:  Total Assist  Financial management: N/A Handwriting: Not legible: Pt. Is able to hold a pen with the left hand, and initiate marking the page. Pt.'s eye glasses were not available  MOBILITY STATUS:  Power w/c  POSTURE COMMENTS:  Pt. Requires position changes in his power w/c  ACTIVITY TOLERANCE: Activity tolerance:  Fair  FUNCTIONAL OUTCOME MEASURES: FOTO: 40  TR score: 45  05/25/2022:   FOTO: 50 TR score: 45  07/20/22: FOTO 55  UPPER EXTREMITY ROM     Active ROM Left eval Left  03/21/2022 Left 05/25/2022 L  07/20/22 Left 07/25/2022 Left  08/15/2022 Left  09/26/2022 Left  11/16/2022 Left 12/28/2022  Shoulder flexion 119 123 125 130  136 138 130 142  Shoulder abduction 110 115 115 115  118 121 125 142  Shoulder adduction           Shoulder extension           Shoulder internal rotation           Shoulder external rotation           Elbow flexion 135(135) 145 145 145  145     Elbow extension -27(-18) -21(-20) -20(-16)  -25(-10) -23(-10) -21(-10) -20(-18) -20(-18)  Wrist flexion           Wrist extension 10(50) 19(50) 28(50)  30(50) 35(55) 36(55) 36(60) 40(60)  Wrist ulnar deviation           Wrist radial deviation           Wrist pronation           Wrist supination   Limited by flexor tone        (Blank rows = not tested)    Active ROM Right eval Right 03/21/2022 Right 05/25/2022 R  07/20/22 Right  07/25/22 Right 08/15/2022 Right 09/26/2022 Right 11/16/2022 Right 12/28/2022  Shoulder flexion 106 scaption 105  Scaption 62 flexion 110 scaption 100  105 105 105 112 Scaption  Shoulder abduction 114 90 97 100  105 117 120 124  Shoulder adduction           Shoulder extension           Shoulder internal rotation           Shoulder external rotation           Elbow flexion 120(130) 145 145 140  144 140 142 148  Elbow extension -45(-35) -22(-35) -28(-22)  -32(-21) -32(-28) -28(-26) -28(-28) -27(-26)  Wrist flexion           Wrist extension -30(10) -25(10)  -10(30)  -10(20) -10(25) -20(40) -30(10) -22(34)  Wrist ulnar deviation           Wrist radial deviation           Wrist pronation           Wrist supination   Limited by flexor tone  12/28/2022: Thumb radial abduction: Right: 12(46), Left: 40(54)  Digits: Bilateral 2nd through 5th digit: MP Hyperextension with PIP, an DIP flexion     UPPER EXTREMITY MMT:     Left eval Left 03/21/2022 Left  05/25/2022 L 07/20/22 Left  08/15/2022 Left 09/26/2022 Left 11/16/2022 Left 12/28/2022  Shoulder flexion 3-/5 3/5 3+/5 4+/5 4+/5 4+/5 4+/5 4+/5  Shoulder abduction 3-/5 3-/5 4-/5 4+/5 4+/5 4+5 4+/5 4+/5  Shoulder adduction          Shoulder extension          Shoulder internal rotation          Shoulder external rotation          Elbow flexion 3/5 3+/5 4-/5 4+/5 5/5 5/5 5/5 5/5  Elbow extension 2-/5 2/5  4+/5 4+/5 4+/5  4/5  Wrist flexion          Wrist extension 2/5 2+/5 3-/5  3-/5 3-/5 3-/5 3/5  Wrist ulnar deviation          Wrist radial deviation          Wrist pronation          Wrist supination          (Blank rows = not tested)  Right eval Right 03/21/2022 Right  05/25/2022 R 07/20/22 Right 08/15/2022 Right 09/26/2022 Right 11/16/2022 Right 12/28/2022  Shoulder flexion 3-/5 scaption 3-/5 3-/5 3+/5 3+/5 3+/5 3/5 3/5  Shoulder abduction 3-/5 3-/5 3-/5 4/5 4/5 4/5 4/5 4/5  Shoulder adduction          Shoulder extension          Shoulder internal rotation          Shoulder external rotation          Elbow flexion 3/5 3/5 3+/5 4/5 4+/5 4+/5 4+/5 4+/5  Elbow extension 2-/5 2/5 3-/5 4+/5 4+/5 4+/5 4+/5 4-/5  Wrist flexion          Wrist extension 2-/5 2-/5 2/5 2/5 2/5 2/5 2/5 2/5  Wrist ulnar deviation          Wrist radial deviation          Wrist pronation          Wrist supination            HAND FUNCTION:  Grip strength: Right: 0 lbs; Left: 0 lbs and Lateral pinch: Right: 5 lbs, Left: 2 lbs  03/21/2022: Lateral pinch: Right: 3.5 lbs, Left: 2 lbs  07/20/22: Grip  strength: Right: 3 lbs; Left: 4 lbs and Lateral pinch: Right: 5 lbs, Left: 3 lbs  08/15/2022:  Grip strength: Right: 0 lbs; Left: 5 lbs and Lateral pinch: Right: 2 lbs, Left: 4 lbs  09/26/2022  Grip strength: Right: 0 lbs; Left: 8 lbs and Lateral pinch: Right: 2 lbs, Left: 5 lbs  11/16/2022  Grip strength: Right: 0 lbs; Left: 8 lbs  9/25/20204  Grip strength: Right: 0 lbs; Left: 8 lbs    Bilateral digit PIP/DIP flexion contractures with MP hyperextension with attempts for AROM. Pt. is able to tolerate AROM to the bilateral digits at the initial evaluation however, has a history of pain in the digits.  COORDINATION: Eval: Pt. is unable to grasp 9-hole test pegs. Pt. is able to initiate grasping larger pegs, and is able to hold a pen in the left hand.  07/20/22: 2 min 36 seconds to remove 9 pegs from 9 hole peg test - cues to locate pegs 2/2 low vision. Pt. is  able to initiate grasping larger pegs on R hand and is able to hold a pen in the left hand.  08/15/2022:      Vision, and sensation limiting accuracy of 9 hole peg test results. Pt. Was able to grasp and remove vertical pegs intermittently with cues.    SENSATION: Light touch: Impaired   EDEMA:  N/A  MUSCLE TONE: BUE flexor Spasticity  COGNITION Overall cognitive status: Continue to assess in functional context  VISION:   Subjective report: Pt. was not wearing glasses at the time of the initial eval.  Baseline vision: Vision is very limited. Wears glasses all the time Visual history: History of impaired vision following CVA. Pt. Has received treatment through the [facility] low vision rehabilitation program.   VISION ASSESSMENT: Impaired To be further assessed in functional context  PERCEPTION: Impaired   PRAXIS: Impaired: motor planning  OBSERVATIONS:  Pt reports being on Tramadol   TODAY'S TREATMENT   Self-care:  Pt./mother report concerns about the Pt.'s grip on the swivel spoon loosening after a couple of  reps when eating cereal. The task was assessed, simulated and reviewed. Pt./caregiver education was provided about the position of the grasp on the swivel spoon for more stabilization during scooping motion, and hand to mouth patterns.   Therapeutic Ex.:  Pt. performed AROM for right shoulder flexion, abduction. Emphasis was placed on slow prolonged gentle passive stretching for RUE, forearm supination, bilateral wrist extension, wrist extension, digit MP, PIP, and DIP extension following moist heat. ROM was performed to decrease stiffness, and prepare the RUE for functional use. ROM was performed following moist heat to the shoulder right elbow, forearm, wrist, and digits. PROM for bilateral digit MP, PIP, and DIP extension in preparation fro placing them into a flat position for weightbearing.     PATIENT EDUCATION: Education details: Doctor, general practice and fork positioning for self feeding Person educated: Patient and Parent Education method: Explanation, Demonstration, Tactile cues, and Verbal cues Education comprehension: verbalized understanding, returned demonstration, verbal cues required, and needs further education  HOME EXERCISE PROGRAM:  Continue ongoing assessment, and continue to provide as needed.   GOALS: Goals reviewed with patient? Yes  SHORT TERM GOALS: Target date: 11/07/2022   To assess splint fit, and make appropriate adjustments to promote good skin integrity through the palmar surface of the bilateral hands.  Baseline: 05/25/22: Goal currently met, however ongoing as needs to assess splint fit arise. 03/23/2022: Pt. is wearing splints a couple of hours at night bilateral resting hand splints. 03/21/2022: Pt. is wearing splints a couple of hours at night bilateral resting hand splints. Goal status: Deferred   LONG TERM GOALS: Target date: 02/08/2023    FOTO score will Improve by 2 points for Pt. perceived improvement with the assessment specific ADL/IADL tasks.  Baseline:  12/28/2022: FOTO score: 56; 11/16/2022: 52 09/27/2022: 51 07/20/2022: FOTO 55 05/25/2022: FOTO score: 50,  TR score: 45 Eval: FOTO score: 40,  TR score: 45 Goal status: Achieved   2.   Pt. will independently perform oral care for 100% of the task after complete set-up. Baseline: 01/09/2023: Continue 12/28/2022:  Pt. Continues to complete 90% of the task with complete set-up, with visual cues, and occassional assist of the right elbow. 11/16/2022: Pt. Completes 90% of the task with complete set-up. 09/26/2022: Completes 90% of the task, proximal assist at times required at the elbow.  08/15/2022: Completes 90% of the task, proximal assist at times required at the elbow. 07/20/22: completes 90% of task, limited  by shoulder flexion. 05/25/2022:  Pt. Is able to initiate and perform oral care for approximately 90% of the task. Complete set-up required. Assi needed only for the very back teeth. 03/23/2022: Pt. Is able to initiate and perform oral care for approximately 75% of the task. Pt. Requires assist at proximally at the elbow for through oral care. 03/21/2022: Pt. Is able to initiate and perform oral care for approximately 75% of the task. Pt. Requires assist at proximally at the elbow for through oral care. Eval: Pt. is able to initiate using an electric toothbrush. Pt. requires assist for set-up, and assist for thoroughness, and as he Pt. fatigues. Goal status: Ongoing  3.  Pt. Will be modified independence with self-feeding for 100% using a swivel spoon, and standard fork after complete set-up Baseline: 01/09/2023: Continue 12/28/2022: Pt. Is now feeding himself 90% of the time using a swivel spoon with the left hand, and 95% of the time with the fork. Pt. Continues to have difficulty with cutting food. 11/16/2022: 100% with finger foods, Pt. Is initiating with a swivel spoon, and universal cuff for fork use, however requires assist for consistency, and accuracy.  09/26/2022: 90% using the spoon, assist at time with  the forearm motion, and grip on the utensils. 100% for finger foods.  08/15/2022:  90% using the spoon, assit at time with the forearm motion, and grip on the utensils. 100% for finger foods-independent with set-up- unsupervised.  07/20/22: self-feeds cereal using spoon 90% of task. 05/25/2022: Pt. Is able to use a spoon to scoop cereal when feeding himself cereal 85% of the time. Pt. Is able to feed himself snack/finger foods 100% of the time. Pt. Continues to work on consistency of  stabilizing a cup/mug when drinking. Pt. Is able to grasp a water bottle with assist initially, with assist tapering off as he drinks.03/23/2022: Pt. Is able to perform scooping cereal for 75% of the time. Pt. required assist, and support at the left elbow, and Pt. Presents with limited forearm supination when using the spoon, and bringing it towards his mouth. Pt. Is able to use a fork to spear items, and perform the hand to mouth pattern.  03/21/2022: Pt. Is able to perform scooping cereal for 75% of the time. Pt. required assist, and support at the left elbow, and Pt. presents with limited forearm supination when using the spoon, and bringing it towards his mouth. Pt. Is able to use a fork to spear items, and perform the hand to mouth pattern.  Eval: Pt. is able to hold standard standard utensils. Pt. Performs as much of the task as he, can and has assistance for the remainder. Goal status:  Met/Revised to 100% of the meal with a swivel spoon, and standard fork.  4.  Pt. Will improve grasp patterns and consistently grasp 1/4" objects for ADL, and IADL tasks.  Baseline: 01/09/2023: Continue 12/28/2022: Continues to grasp 1/4" pegs with min a + visual cues, consistently grasping 1/2" objects with visual cues 11/16/2022: Continues to grasp 1/4" pegs with min a + visual cues, consistently grasping 1/2" objects with visual cues 09/26/2022: Continues to grasp 1/4" pegs with min a + visual cues, consistently grasping 1/2" objects with visual  cues. 08/15/2022: grasps 1/4" pegs with min a + visual cues, consistently grasping 1/2" objects with visual cues. 07/20/22: grasps 1/4" pegs with min a + visual cues, consistently grasping 1/2" objects with visual cues. 05/25/2022: Pt. Is working on improving consistency of grasping 1/2" objects with visual cues.  03/23/2022: Pt. Is able to grasp 1" objects consistently,and continues to work on the hand patterns needed to grasp 1/2" objects.03/21/2022: Pt. Is able to grasp 1" objects consistently,and continues to work on the hand patterns needed to grasp 1/2" objects. Eval: Pt. is able to grasp 1" objects intermittently using a lateral grasp pattern. Goal status: Ongoing  5.  Pt. will independently write his name legibly with letter sizes under 1". Baseline: 01/09/2023: Continue. 12/28/2022: Pt. is now using an IPad Pen. 11/16/2022: Pt. Continues to be able to write name with smaller letter size. Pt. Is signing name with a computer styus. Pt. Requires visual cues, and assist 09/26/2022: Pt. Continues to be able to write name with smaller letter size. Pt. Is signing name with a computer styus. Pt. Requires visual cues, and assist. 08/15/2022: Pt. Is able to write name with smaller letter size. Pt. Is signing name with a computer styus. Pt. Requires visual cues, and assist. 07/20/22: stabilizing assist to write name with 75% legibility with 2" letters. 05/25/2022: Pt. to continue to work towards formulating grasp patterns in preparation for grasping a large width pen. Pt. Requires visual cues. 03/23/2022: Pt. Is able to write his name with modA, however has difficulty with formulating letter sizes less than 2" in size with 50% legibility for the 3 letters of his name.03/21/2022: Pt. Is able to write his name with modA, however has difficulty with formulating letter sizes less than 2" in size with 50% legibility for the 3 letters of his name. Eval: Pt is able to hold a thin marker with his left hand, and formulate a line,  and initiate a circular pattern (Pt. without glasses today) Goal status: Ongoing  6. Pt. Will reach up to comb/brush his hair with minA.  Baseline: 01/09/2023: Continue 12/28/2022: Pt. Is progressing, and is now able to use a hair pick with the left hand on the right side of his head, top, and back of the head. Pt. Continues to have difficulty reaching further to the right side of his head with the left. 11/16/2022: Pt. Is able to use a hair pick for the left side of his head using his left hand. Pt. Presents with difficulty reaching to the right side of his head.09/26/2022: Pt. Is able to use a hair pick for the left side of his head using his left hand. Pt. Presents with difficulty reaching to the right side of his head. 5/13/202: 75% using a hair pick, assist to the right side of the head. 07/20/22: reaches 75% of head, assist for far R side of head, fatigues quickly. 05/25/2022: Pt. Is able to reach up with the left hand to the left side, top, and back of his head. 03/23/2022: Pt. is now able to more consistently initiate reaching up to his head with his left hand in preparation for haircare12/18/2023: Pt. is now able to more consistently initiate reaching up to his head with his left hand in preparation for haircare. Eval: Pt. is able to initiate reaching up for hair care with a long handled brush, however is unable to sustain UE's in elevation to perform the task.     Goal status: Progressing/Ongoing  7. Pt. Will independently navigate the w/c through his environment with minA with visual scanning, and hand placement on the controls.  Baseline: 11/16/2022: Deferred 2/2 vision 09/26/2022: Pt. Is now navigating his w/c ouside the home, and around his block. 08/15/2022: Pt. Requires minA to setup hand on controls and MIN cues to navigate the  w/c in wide spaces, requires MIN - MOD cues to navigate the w/c through more narrow doorways, and tighter turns - varies based on good vs bad vision days.07/20/22: Pt.  Requires minA to setup hand on controls and MIN cues to navigate the w/c in wide spaces, requires MIN - MOD cues to navigate the w/c through more narrow doorways, and tighter turns - varies based on good vs bad vision days. 05/25/2022: Pt. Requires minA  and  cues to navigate the w/c in wide spaces, and requires Mod cues to navigate the w/c through more narrow doorways, and tighter turns. Pt. Requires max cues for scanning through the environment, and moderate cues for hand placement on the controls.     Goal status: Deferred 2/2 vision    8. Pt. Will improve bilateral grip strength to be able to independently grasp, and pull up blankets, and linens.   Baseline: 01/09/2023: Continue 12/28/2023: R: 0#, L: 5# Pt. Is now able to more consistently grasp and pull blankets up over him. 11/16/2022: R: 0 L: 8# Pt. Is more consistently grasping his blankets and attempting to pull them up. 09/26/2022: R: 0  L: 8# Pt. is attempting to grasp, and pull blankets up more, using mostly the left hand. 08/15/2022: Pt. has difficulty securely holding, and pulling up blankets, and linens.    Goal status: Partially met  9. Pt. will consistently actively control the releasing  of blankets, covers, and linens from his hands once they are in the desired position over him. Baseline:12/28/2022: Pt. Is consistently actively able to release linens once they are in the desired position over him. 11/16/2022: Pt. continues to improve with actively releasing blankets form his hands. 09/26/2022: Pt. is improving with actively releasing blankets form his hands. 08/15/2022: Pt. has difficulty with controlled releasing of blankets/linens from his grasp.     Goal status: Achieved    10. Pt. Will improve bilateral UE strength by 2 MM grades to assist with ADLs, and IADLs  Baseline: 01/09/2023: Continue 12/28/2022: Right: shoulder flexion: 3/5, abduction: 4/5, elbow flexion: 4+/5 , extension: 4-/5 wrist extension: 2/5; Left: shoulder flexion: 4+/5,  abduction: 4+/5, elbow flexion: 5/5 , extension: 4/5  wrist extension: 3/5 Right: shoulder flexion: 3+/5, abduction: 4/5, elbow flexion: 4+/5 , extension: 4+/5 wrist extension: 2/5; Left: shoulder flexion: 4+/5, abduction: 4+/5, elbow flexion: 5/5 , extension: 4+/5 wrist extension: 3-/5 11/16/2022: Right: shoulder flexion: 3/5, abduction: 4/5, elbow flexion: 4+/5 , extension: 4+/5 wrist extension: 2/5; Left: shoulder flexion: 4+/5, abduction: 4+/5, elbow flexion: 5/5 , extension:  wrist extension: 3-/5 Right: shoulder flexion: 3+/5, abduction: 4/5, elbow flexion: 4+/5 , extension: 4+/5 wrist extension: 2/5; Left: shoulder flexion: 4+/5, abduction: 4+/5, elbow flexion: 5/5 , extension: 4+/5 wrist extension: 3-/5 Goal status: Ongoing  11. Pt. Will improve right wrist extension by 10 degrees to initiate reaching for items in preparation for ADLs. Baseline: 01/09/2023: Continue 12/28/2022: -22(34) 11/16/2022: -30(10)    Goal status: Progressing   ASSESSMENT:  CLINICAL IMPRESSION:  Pt. hand position, and grasp on the swivel spoon was assessed during scooping action, and during the hand to mouth patterns. Pt. is responding well to moist heat, and stretching. Pt. continues to present with increased tone, and tightness in the BUEs in the forearm pronators limiting supination, 2nd through 5th digit PIP, and DIP flexion limiting extension when releasing items. Pt. required visual, tactile, and verbal cues for theraputty exercises. Pt. continues to be highly motivated to continue to work towards  improving BUE functioning, ROM, strength,  motor control, [facility] skills, as well as visual compensatory strategies in order to maximize independence with daily ADLs, and IADL functioning.      PERFORMANCE DEFICITS in functional skills including ADLs, IADLs, coordination, dexterity, proprioception, ROM, strength, pain, FMC, GMC, decreased knowledge of use of DME, and UE functional use, cognitive skills including safety  awareness, and psychosocial skills including coping strategies, environmental adaptation, habits, and routines and behaviors.   IMPAIRMENTS are limiting patient from ADLs, IADLs, leisure, and social participation.   COMORBIDITIES may have co-morbidities  that affects occupational performance. Patient will benefit from skilled OT to address above impairments and improve overall function.  MODIFICATION OR ASSISTANCE TO COMPLETE EVALUATION: Maximum or significant modification of tasks or assist is necessary to complete an evaluation.  OT OCCUPATIONAL PROFILE AND HISTORY: Comprehensive assessment: Review of records and extensive additional review of physical, cognitive, psychosocial history related to current functional performance.  CLINICAL DECISION MAKING: High - multiple treatment options, significant modification of task necessary  REHAB POTENTIAL: Fair    EVALUATION COMPLEXITY: High    PLAN: OT FREQUENCY: 2 x's a week  OT DURATION:12 weeks  PLANNED INTERVENTIONS: self care/ADL training, therapeutic exercise, therapeutic activity, neuromuscular re-education, manual therapy, passive range of motion, patient/family education, and cognitive remediation/compensation RECOMMENDED OTHER SERVICES: PT  CONSULTED AND AGREED WITH PLAN OF CARE: Patient and family member/caregiver  PLAN FOR NEXT SESSION: Initiate treatment  Olegario Messier, MS, OTR/L   01/30/23, 5:16 PM

## 2023-01-31 NOTE — Therapy (Signed)
OUTPATIENT PHYSICAL THERAPY TREATMENT     Patient Name: Paul Hall MRN: [id number] DOB:03-Aug-1995, 27 y.o., male Today's Date: 01/31/2023  PCP:  Cindi Carbon PROVIDER:  Lenise Herald, PA-C   PT End of Session - 01/31/23 1744     Visit Number 131    Number of Visits 146    Date for PT Re-Evaluation 02/22/23    Authorization Type [employer]BS COMM Pro/ [place] Medicaid    Progress Note Due on Visit 140    PT Start Time 1550    PT Stop Time 1615    PT Time Calculation (min) 25 min    Equipment Utilized During Treatment Gait belt    Activity Tolerance Patient tolerated treatment well    Behavior During Therapy WFL for tasks assessed/performed                     Past Medical History:  Diagnosis Date   Diabetes mellitus (HCC)    Hypertension    Stroke (HCC)    Past Surgical History:  Procedure Laterality Date   IVC FILTER PLACEMENT ([facility] HX)     LEG SURGERY     PEG TUBE PLACEMENT     TRACHEOSTOMY     Patient Active Problem List   Diagnosis Date Noted   Sepsis due to vancomycin resistant Enterococcus species (HCC) 06/06/2019   SIRS (systemic inflammatory response syndrome) (HCC) 06/05/2019   Acute lower UTI 06/05/2019   VRE (vancomycin-resistant Enterococci) infection 06/05/2019   Anemia 06/05/2019   Skin ulcer of sacrum with necrosis of muscle (HCC)    Urinary retention    Type 2 diabetes mellitus without complication, with long-term current use of insulin (HCC)    Tachycardia    Lower extremity edema    Acute metabolic encephalopathy    Obstructive sleep apnea    Morbid obesity with BMI of 60.0-69.9, adult (HCC)    Goals of care, counseling/discussion    Palliative care encounter    Sepsis (HCC) 04/27/2019   H/O insulin dependent diabetes mellitus 04/27/2019   History of CVA with residual deficit 04/27/2019   Seizure disorder (HCC) 04/27/2019   Decubitus ulcer of sacral region, stage 4 (HCC) 04/27/2019    REFERRING DIAG: Cerebral infarction,  unspecified   THERAPY DIAG:  Muscle weakness (generalized)  Other lack of coordination  History of CVA with residual deficit  Abnormality of gait and mobility  Difficulty in walking, not elsewhere classified  Unsteadiness on feet  Rationale for Evaluation and Treatment Rehabilitation  PERTINENT HISTORY: Paul Hall is a 26yoM who presents with severe weakness, quadriparesis, altered sensorium, and visual impairment s/p critical illness and prolonged hospitalization. Pt hospitalized in October 2020 with ARDS 2/2 COVID19 infection. Pt sustained a complex and lengthy hospitalization which included tracheostomy, prolonged sedation, ECMO. In this period pt sustained CVA and SDH. Pt has now been liberated from tracheostomy and G-tube. Pt has since been hospitalized for wound infection and UTI. Pt lives with parents at home, has hospital bed and left chair, hoyer lift transfers, and power WC for mobility needs. Pt needs heavy physical assistance with ADL 2/2 BUE contractures and motor dysfunction   PRECAUTIONS: Fall  SUBJECTIVE:  Patient reports late due to [name] issues. Patient reports no pain but some anxiety today.     PAIN:  Are you having pain? No   TODAY'S TREATMENT:  - 01/30/2023     *Dependent transfer from w/c to edge of mat using w/c   THEREX: (all therex performed seated at edge  of mat)    Active ankle DF/PF x 15 reps Active knee ext/flex x 15 reps each LE Active hip march  x 15 reps each LE  Therapeutic Activities- Attempted static standing at edge of mat with +2 forearm hold support and use of gait belt, max + 2. More difficulty standing erect today due to fatigue. Attempted 3 trials today.       PATIENT EDUCATION: Education details: Progression of intensity at home with trunk strength exercises and how to grade intensity using a goniometer bubble app.   HOME EXERCISE PROGRAM:  Access Code: [email or web address] URL: [email or web address] Date:  03/23/2022 Prepared by: Maureen Ralphs  Exercises - Supine Bridge  - 3 x weekly - 3 sets - 10 reps - 2 hold - Supine Gluteal Sets  - 3 x weekly - 3 sets - 10 reps - 5 sec hold - Supine Quad Set  - 1 x daily - 3 x weekly - 3 sets - 10 reps - 5 hold - Seated Long Arc Quad  - 1 x daily - 7 x weekly - 3 sets - 10 reps - Seated Hip Adduction Squeeze with Ball  - 1 x daily - 3 x weekly - 3 sets - 10 reps - 5 hold - Seated Hip Abduction  - 1 x daily - 3 x weekly - 3 sets - 10 reps - 2 hold    PT Short Term Goals -       PT SHORT TERM GOAL #1   Title Pt will be independent with HEP in order to improve strength and balance in order to decrease fall risk and improve function at home and work.    Baseline 01/04/2021= No formal HEP in place; 12/12 no HEP in place; 05/10/2021-Patient and his father were able to report compliance with curent HEP consisting of mostly seated/reclined LE strengthening. Both verbalize no questions at this time.    Time 6    Period Weeks    Status Achieved    Target Date 02/15/21            PT LONG TERM GOAL #1     Title Patient will increase BLE gross strength by 1/2 muscle grade to improve functional strength for improved independence with potential gait, increased standing tolerance and increased ADL ability.     Baseline 01/04/2021- Patient presents with 1/5 to 3-/5 B LE strength with MMT; 12/12: goal partially met for Left knee/hip; 05/10/2021= 2-/5 bilateal Hip flex; 3+/5 bilateral Knee ext; 06/21/2021= Patient presents with 2-/5 bilateral Hip flex; 3+/5 bilateral knee ext/flex; 2-/5 left ankle DF; 0/5 right ankle- and able to increase reps and resistance with LE's. 09/15/2021- Patient technically presents with 2-/5 B hip flex/abd/add - but he is able to raise his hip up to approx 100 deg which has improved. 3+/5 Bilateral knee ext, 2-/5 left ankle and 0/5 right ankle.  12/08/2021= Patient able to lift left knee at 110 deg of hip flex; presents with 3+/5 knee ext, 2-/5  left ankle DF and 0/5 right ankle DF, 2-/5 bilateral Hip abd in seated position.    12/6: R: knee 3+/5 ext, 2/5 flexion, left knee 3+/5 extension, 3+/5 flexion, R hip: 2+/5 hip add, 2+/5 hip ABD L hip: 4-/5 hip ABD, 3+/5 hip ADD, 3+/5 hip flexion; 06/06/2022= Patient now presents with 2-/5 right ankle DF/PF;     Time 12     Period Weeks     Status MET    Target Date 03/07/2022  PT LONG TERM GOAL #2    Title Patient will tolerate sitting unsupported demonstrating erect sitting posture for 15 minutes with CGA to demonstrate improved back extensor strength and improved sitting tolerance.     Baseline 01/04/2021- Patient confied to sitting in lift chair or electric power chair with back support and unable to sit upright without physical assistance; 12/12: tolerates <1 minutes upright unsupported sitting. 05/10/2021=static sit with forward trunk lean  in his power wheelchair without back support x approx 3 min. 06/21/2021=Unable to assess today due to patient with acute back pain but on previous visit able to sit x 8 min without back support. 09/15/2021- on last visit- 09/13/2021- patient was able to sit unsupported x 8 min at edge of mat. 10/13/2023 - Patient was able to sit at edge of mat with varying level of assist today from SBA to min A for a total of 20 min. 12/13/2021= Patient demonstrated unsupported sitting at edge of mat for approx 20 min    Time 12     Period Weeks     Status GOAL MET    Target Date 12/08/21          PT LONG TERM GOAL #3    Title Patient will demonstrate ability to perform static standing in // bars > 2 min with Max Assist  without loss of balance and fair posture for improved overall strength for pre-gait and transfer activities.     Baseline 01/04/2021= Patient current uanble to stand- Dependent on hoyer or sit to stand lift for transfers. 05/10/2021=Not appropriate yet- Currently still dependent with all transfers using hoyer. 06/21/2021= Patient continuing now to focus on LE  strengthening to prepare for standing-unable to try today due to acute low back pain-  planning on attempting in new cert period. 09/15/2021- Patient has attempted standing 2x in past two week- max Assist of 2 people - only once was he successful to clearing his bottom from chair - Will continue to be a focus during the new certification. 12/13/2021= Patient has been limited secondary to increased overall low back pain during this certification and will require more time to focus on this goal.  12/6: not assessed this date, will assess at date when 2-3 PTs are present for assistance     Time 12     Period Weeks     Status GOAL not appropriate at this time - may attempt in future once Patient presents with improved overall LE strength.                 PT LONG TERM GOAL #4    Title Pt will improve FOTO score by 10 points or more demonstrating improved perceived functional ability     Baseline FOTO 7 on 10/17; 03/15/21: FOTO 12; 05/10/2021 06/21/2021= 1; 09/15/2021= 9; 12/13/2021= Will issue next visit 12/6: 4; 06/06/2022= Will assess next visit; Goal not appropriate as patient is not ambulatory.     Time 12     Period Weeks     Status WITHDRAWN    Target date 11/30/2022         PT LONG TERM GOAL #5    Title Patient will perform sit to stand transfer with appropriate AD and max assist of 2 people with 75% consistency to prepare for pregait activities.     Baseline 09/15/2021= Patient unable to stand well- unable to clear his bottom off chair with Max assist of 2 persons. 12/13/2021- Goal not appropriate to try yet but will keep  and roll over to next cert as shift continues to focus on transfers/standing; 4/24= Patient able to perform active ankle DF/PF with right LE and able to raise his knee into seated march and clear floor without physical assist today - as previously unable as well as lift right knee ext to near full ROM to improve strength for eventual standing. 09/07/2022- Goal continues to not be appropriate  but patient is now standing some and will keep goal active; 11/23/2022= patient is now performing sit to stand with max A +2 and now able to clear his bottom off mat. 11/30/2022- Patient continues to perform sit to stand transfers with Max A+2 and able to clear bottom well off mat but not erect yet-     Time 12     Period Weeks     Status PROGRESSING    Target date  02/22/2023       PT LONG TERM GOAL #6  Title Patient will tolerate sitting unsupported demonstrating erect sitting posture for 30 minutes with CGA to demonstrate improved back extensor strength and improved sitting tolerance.   Baseline 12/13/2021= Patient demonstrated unsupported sitting at edge of mat for approx 20 min;  12/29/2021- Patient performed approx 30 min of dynamic sitting activities today. 06/06/2022= Patient demonstrated ability to sit and perform static and dynamic UE/LE movement with only Supervision.   Time 12   Period Weeks   Status GOAL MET           7.  Patient will tolerate 2 minutes or more of standing in sit to stand lift or with max assist +2 in order to indicate improved lower extremity weightbearing tolerance for progression to standing in parallel bars. Baseline: 1 minute on most recent stand 02/21/22; 06/06/2022- Patient did attempt today after complaining of right LE pain last week. He attempted 3 stands using sit to stand lift. - all over 1 min- last one approx 48 sec- stopped due to fatigue. More erect standing in lift today - still poor gluteal strength but able to activate glutes and extend hips upon command but unable to hold > 5 sec. 07/28/2022- Will attempt standing next visit but patient has been able to stand since last progress note for up to 3 min at a time at his best. 09/07/2022-  attempted standing with max +2 and patient able to stand 15-20 sec so will now  revise goal; 11/23/2022- Patient at his best between last 2 visits was able to stand approx 50 sec with max A+2.  11/30/2022- Patient performed 4  trials of static standing - max A +2- clearing bottom and using forearms to bear weight  Goal status: Revised Target date: 02/22/2023   8. Patient will tolerate sitting unsupported demonstrating ability to perform dynamic UE/LE activities for 30 minutes  independently to demonstrate ability to sit at edge of bed to eat or perform some ADL's/exercise for optimal quality of life.             Baseline: 06/06/2022- Patient able to sit and perform some UE/LE exercises but requires CGA at times for safety- he is able to static sit for 30 min with supervision. 07/27/2022= Patient able to now sit > 30 min with Supervision only - performing static and dynamic activities. Will keep goal active to ensure patient is consistent. 09/07/2022=  Patient able to demonstrate consistent ability to sit at edge of mat during visits             Goal Status: MET  Target date: 08/29/2022        Plan     Clinical Impression Statement  Treatment limited secondary to patient 20 min late due to transportation issues. He experienced more difficulty standing today with increased overall fatigue. May be from lingering effects of being sick last week.  Pt will continue to benefit from skilled physical therapy intervention to address impairments, improve QOL, and attain therapy goals.    Personal Factors and Comorbidities Comorbidity 3+;Time since onset of injury/illness/exacerbation    Comorbidities CVA, diabetes, Seizures    Examination-Activity Limitations Bathing;Bed Mobility;Bend;Caring for Others;Carry;Dressing;Hygiene/Grooming;Lift;Locomotion Level;Reach Overhead;Self Feeding;Sit;Squat;Stairs;Stand;Transfers;Toileting    Examination-Participation Restrictions Cleaning;Community Activity;Driving;Laundry;Medication Management;Meal Prep;Occupation;Personal Finances;Shop;Yard Work;Volunteer    Stability/Clinical Decision Making Evolving/Moderate complexity    Rehab Potential Fair    PT Frequency 2x / week    PT  Duration 12 weeks    PT Treatment/Interventions ADLs/Self Care Home Management;Cryotherapy;Electrical Stimulation;Moist Heat;Ultrasound;DME Instruction;Gait training;Stair training;Functional mobility training;Therapeutic exercise;Balance training;Patient/family education;Orthotic Fit/Training;Neuromuscular re-education;Wheelchair mobility training;Manual techniques;Passive range of motion;Dry needling;Energy conservation;Taping;Visual/perceptual remediation/compensation;Joint Manipulations    PT Next Visit Plan core strength/motor control in short-sitting, sit to stand if able; Continue with progressive LE Strengthening. Stand activities as appropriate.     PT Home Exercise Plan No changes to HEP today    Consulted and Agree with Plan of Care Patient;Family member/caregiver    Family Member Consulted            5:47 PM, 01/31/23 Louis Meckel, PT Physical Therapist - [facility]  Outpatient Physical Therapy- [facility] [phone]      01/31/2023, 5:47 PM

## 2023-02-01 ENCOUNTER — Ambulatory Visit: Payer: BC Managed Care – PPO | Admitting: Occupational Therapy

## 2023-02-01 ENCOUNTER — Ambulatory Visit: Payer: BC Managed Care – PPO

## 2023-02-01 DIAGNOSIS — M6281 Muscle weakness (generalized): Secondary | ICD-10-CM

## 2023-02-01 DIAGNOSIS — R278 Other lack of coordination: Secondary | ICD-10-CM | POA: Diagnosis not present

## 2023-02-01 DIAGNOSIS — R2681 Unsteadiness on feet: Secondary | ICD-10-CM

## 2023-02-01 DIAGNOSIS — R269 Unspecified abnormalities of gait and mobility: Secondary | ICD-10-CM

## 2023-02-01 DIAGNOSIS — I6930 Unspecified sequelae of cerebral infarction: Secondary | ICD-10-CM

## 2023-02-01 DIAGNOSIS — R262 Difficulty in walking, not elsewhere classified: Secondary | ICD-10-CM

## 2023-02-01 NOTE — Therapy (Signed)
Occupational Therapy Neuro Treatment Note  Patient Name: Paul Hall MRN: [id number] DOB:[birth year], 27 y.o., male Today's Date: 02/01/2023  PCP: Dr. Sherwood Gambler REFERRING PROVIDER: Dr. Sherwood Gambler   OT End of Session - 02/01/23 1743     Visit Number [id number]    Number of Visits 84    Date for OT Re-Evaluation 02/08/23    OT Start Time 1615    OT Stop Time 1700    OT Time Calculation (min) 45 min    Activity Tolerance Patient tolerated treatment well    Behavior During Therapy WFL for tasks assessed/performed                  Past Medical History:  Diagnosis Date   Diabetes mellitus (HCC)    Hypertension    Stroke [facility])    Past Surgical History:  Procedure Laterality Date   IVC FILTER PLACEMENT ([facility] HX)     LEG SURGERY     PEG TUBE PLACEMENT     TRACHEOSTOMY     Patient Active Problem List   Diagnosis Date Noted   Sepsis due to vancomycin resistant Enterococcus species (HCC) 06/06/2019   SIRS (systemic inflammatory response syndrome) (HCC) 06/05/2019   Acute lower UTI 06/05/2019   VRE (vancomycin-resistant Enterococci) infection 06/05/2019   Anemia 06/05/2019   Skin ulcer of sacrum with necrosis of muscle (HCC)    Urinary retention    Type 2 diabetes mellitus without complication, with long-term current use of insulin (HCC)    Tachycardia    Lower extremity edema    Acute metabolic encephalopathy    Obstructive sleep apnea    Morbid obesity with BMI of 60.0-69.9, adult (HCC)    Goals of care, counseling/discussion    Palliative care encounter    Sepsis (HCC) 04/27/2019   H/O insulin dependent diabetes mellitus 04/27/2019   History of CVA with residual deficit 04/27/2019   Seizure disorder (HCC) 04/27/2019   Decubitus ulcer of sacral region, stage 4 (HCC) 04/27/2019   ONSET DATE: 01/2019  REFERRING DIAG: CVA/COVID-19  THERAPY DIAG:  Muscle weakness (generalized)  Rationale for Evaluation and Treatment Rehabilitation  SUBJECTIVE:   SUBJECTIVE  STATEMENT:  Pt. Reports doing well today.   Pt accompanied by: self and family member  PERTINENT HISTORY:  Pt. is a 27 y.o. male who was diagnosed with COVID-19, and CVA with resultant quadriplegia. Pt. Was then hospitalized with VRE UTI. PMHx includes: urinary retention, seizure disorder, obstructive sleep disorder, DM Type II, Morbid obesity.   PRECAUTIONS: None  WEIGHT BEARING RESTRICTIONS No  PAIN:  Are you having pain? No reports of pain  FALLS: Has patient fallen in last 6 months? No  LIVING ENVIRONMENT: Lives with: lives with their family Lives in: House/apartment Stairs: No Level Entry Has following equipment at home: Wheelchair (power) and hoyer lift, sit to stand lift  PLOF: Independent  PATIENT GOALS: To be able to engage in more daily care tasks.  OBJECTIVE:   HAND DOMINANCE: Right  ADLs:  Transfers/ambulation related to ADLs: Eating: Pt. reports being able to hold standard utensils, and is starting to engage more in self-feeding tasks, hand to mouth patterns. Pt. Reports that he does as much as he can with the task, and family assists  with the remainder of the task. Grooming: Pt. Is able to initiate holding an electric toothbrush, and brush his teeth. Family assists LB Dressing: Total Assist UE dressing: Pt. is now able to reach up to actively assist with grasping , and pulling his  gown down. Toileting:  Total Assist Bathing: MaxA UB, Total assist LB Tub Shower transfers: N/A Equipment: See above    IADLs: Shopping: Relies on family to assist Light housekeeping: Total Assist Meal Prep: Total Assist Community mobility:   Medication management:  Total Assist  Financial management: N/A Handwriting: Not legible: Pt. Is able to hold a pen with the left hand, and initiate marking the page. Pt.'s eye glasses were not available  MOBILITY STATUS:  Power w/c  POSTURE COMMENTS:  Pt. Requires position changes in his power w/c  ACTIVITY TOLERANCE: Activity  tolerance:  Fair  FUNCTIONAL OUTCOME MEASURES: FOTO: 40  TR score: 45  05/25/2022:   FOTO: 50 TR score: 45  07/20/22: FOTO 55  UPPER EXTREMITY ROM     Active ROM Left eval Left  03/21/2022 Left 05/25/2022 L  07/20/22 Left 07/25/2022 Left  08/15/2022 Left  09/26/2022 Left  11/16/2022 Left 12/28/2022  Shoulder flexion 119 123 125 130  136 138 130 142  Shoulder abduction 110 115 115 115  118 121 125 142  Shoulder adduction           Shoulder extension           Shoulder internal rotation           Shoulder external rotation           Elbow flexion 135(135) 145 145 145  145     Elbow extension -27(-18) -21(-20) -20(-16)  -25(-10) -23(-10) -21(-10) -20(-18) -20(-18)  Wrist flexion           Wrist extension 10(50) 19(50) 28(50)  30(50) 35(55) 36(55) 36(60) 40(60)  Wrist ulnar deviation           Wrist radial deviation           Wrist pronation           Wrist supination   Limited by flexor tone        (Blank rows = not tested)    Active ROM Right eval Right 03/21/2022 Right 05/25/2022 R  07/20/22 Right  07/25/22 Right 08/15/2022 Right 09/26/2022 Right 11/16/2022 Right 12/28/2022  Shoulder flexion 106 scaption 105  Scaption 62 flexion 110 scaption 100  105 105 105 112 Scaption  Shoulder abduction 114 90 97 100  105 117 120 124  Shoulder adduction           Shoulder extension           Shoulder internal rotation           Shoulder external rotation           Elbow flexion 120(130) 145 145 140  144 140 142 148  Elbow extension -45(-35) -22(-35) -28(-22)  -32(-21) -32(-28) -28(-26) -28(-28) -27(-26)  Wrist flexion           Wrist extension -30(10) -25(10) -10(30)  -10(20) -10(25) -20(40) -30(10) -22(34)  Wrist ulnar deviation           Wrist radial deviation           Wrist pronation           Wrist supination   Limited by flexor tone          12/28/2022: Thumb radial abduction: Right: 12(46), Left: 40(54)  Digits: Bilateral 2nd through 5th digit: MP Hyperextension with  PIP, an DIP flexion     UPPER EXTREMITY MMT:     Left eval Left 03/21/2022 Left  05/25/2022 L 07/20/22 Left  08/15/2022 Left 09/26/2022 Left 11/16/2022 Left 12/28/2022  Shoulder flexion 3-/5 3/5 3+/5 4+/5 4+/5 4+/5 4+/5 4+/5  Shoulder abduction 3-/5 3-/5 4-/5 4+/5 4+/5 4+5 4+/5 4+/5  Shoulder adduction          Shoulder extension          Shoulder internal rotation          Shoulder external rotation          Elbow flexion 3/5 3+/5 4-/5 4+/5 5/5 5/5 5/5 5/5  Elbow extension 2-/5 2/5  4+/5 4+/5 4+/5  4/5  Wrist flexion          Wrist extension 2/5 2+/5 3-/5  3-/5 3-/5 3-/5 3/5  Wrist ulnar deviation          Wrist radial deviation          Wrist pronation          Wrist supination          (Blank rows = not tested)  Right eval Right 03/21/2022 Right  05/25/2022 R 07/20/22 Right 08/15/2022 Right 09/26/2022 Right 11/16/2022 Right 12/28/2022  Shoulder flexion 3-/5 scaption 3-/5 3-/5 3+/5 3+/5 3+/5 3/5 3/5  Shoulder abduction 3-/5 3-/5 3-/5 4/5 4/5 4/5 4/5 4/5  Shoulder adduction          Shoulder extension          Shoulder internal rotation          Shoulder external rotation          Elbow flexion 3/5 3/5 3+/5 4/5 4+/5 4+/5 4+/5 4+/5  Elbow extension 2-/5 2/5 3-/5 4+/5 4+/5 4+/5 4+/5 4-/5  Wrist flexion          Wrist extension 2-/5 2-/5 2/5 2/5 2/5 2/5 2/5 2/5  Wrist ulnar deviation          Wrist radial deviation          Wrist pronation          Wrist supination            HAND FUNCTION:  Grip strength: Right: 0 lbs; Left: 0 lbs and Lateral pinch: Right: 5 lbs, Left: 2 lbs  03/21/2022: Lateral pinch: Right: 3.5 lbs, Left: 2 lbs  07/20/22: Grip strength: Right: 3 lbs; Left: 4 lbs and Lateral pinch: Right: 5 lbs, Left: 3 lbs  08/15/2022:  Grip strength: Right: 0 lbs; Left: 5 lbs and Lateral pinch: Right: 2 lbs, Left: 4 lbs  09/26/2022  Grip strength: Right: 0 lbs; Left: 8 lbs and Lateral pinch: Right: 2 lbs, Left: 5 lbs  11/16/2022  Grip strength: Right: 0 lbs;  Left: 8 lbs  9/25/20204  Grip strength: Right: 0 lbs; Left: 8 lbs    Bilateral digit PIP/DIP flexion contractures with MP hyperextension with attempts for AROM. Pt. is able to tolerate AROM to the bilateral digits at the initial evaluation however, has a history of pain in the digits.  COORDINATION: Eval: Pt. is unable to grasp 9-hole test pegs. Pt. is able to initiate grasping larger pegs, and is able to hold a pen in the left hand.  07/20/22: 2 min 36 seconds to remove 9 pegs from 9 hole peg test - cues to locate pegs 2/2 low vision. Pt. is able to initiate grasping larger pegs on R hand and is able to hold a pen in the left hand.  08/15/2022:      Vision, and sensation limiting accuracy of 9 hole peg test results. Pt. Was able to grasp and remove vertical pegs intermittently with cues.  SENSATION: Light touch: Impaired   EDEMA:  N/A  MUSCLE TONE: BUE flexor Spasticity  COGNITION Overall cognitive status: Continue to assess in functional context  VISION:   Subjective report: Pt. was not wearing glasses at the time of the initial eval.  Baseline vision: Vision is very limited. Wears glasses all the time Visual history: History of impaired vision following CVA. Pt. Has received treatment through the [facility] low vision rehabilitation program.   VISION ASSESSMENT: Impaired To be further assessed in functional context  PERCEPTION: Impaired   PRAXIS: Impaired: motor planning  OBSERVATIONS:  Pt reports being on Tramadol   TODAY'S TREATMENT     Therapeutic Ex.:  Pt. performed AROM for right shoulder flexion, abduction. Emphasis was placed on slow prolonged gentle passive stretching for RUE, forearm supination, bilateral wrist extension, wrist extension, digit MP, PIP, and DIP extension following moist heat. ROM was performed to decrease stiffness, and prepare the RUE for functional use. ROM was performed following moist heat to the shoulder right elbow, forearm, wrist, and  digits. PROM for bilateral digit MP, PIP, and DIP extension in preparation for placing them into a flat position for weightbearing using a foam block. Pt. Worked on right hand lateral pinch strengthening followed by reps of reaching out with right elbow extension, and extending his digits to release them onto an elevated surface.  Manual therapy:   Pt. tolerated soft tissue mobilizations for right radius on ulna to facilitate supination 2/2 increased tone, and tightness. Manual therapy was performed independent of, and in preparation for There. Ex.   PATIENT EDUCATION: Education details: Doctor, general practice and fork positioning for self feeding Person educated: Patient and Parent Education method: Explanation, Demonstration, Tactile cues, and Verbal cues Education comprehension: verbalized understanding, returned demonstration, verbal cues required, and needs further education  HOME EXERCISE PROGRAM:  Continue ongoing assessment, and continue to provide as needed.   GOALS: Goals reviewed with patient? Yes  SHORT TERM GOALS: Target date: 11/07/2022   To assess splint fit, and make appropriate adjustments to promote good skin integrity through the palmar surface of the bilateral hands.  Baseline: 05/25/22: Goal currently met, however ongoing as needs to assess splint fit arise. 03/23/2022: Pt. is wearing splints a couple of hours at night bilateral resting hand splints. 03/21/2022: Pt. is wearing splints a couple of hours at night bilateral resting hand splints. Goal status: Deferred   LONG TERM GOALS: Target date: 02/08/2023    FOTO score will Improve by 2 points for Pt. perceived improvement with the assessment specific ADL/IADL tasks.  Baseline: 12/28/2022: FOTO score: 56; 11/16/2022: 52 09/27/2022: 51 07/20/2022: FOTO 55 05/25/2022: FOTO score: 50,  TR score: 45 Eval: FOTO score: 40,  TR score: 45 Goal status: Achieved   2.   Pt. will independently perform oral care for 100% of the task after  complete set-up. Baseline: 01/09/2023: Continue 12/28/2022:  Pt. Continues to complete 90% of the task with complete set-up, with visual cues, and occassional assist of the right elbow. 11/16/2022: Pt. Completes 90% of the task with complete set-up. 09/26/2022: Completes 90% of the task, proximal assist at times required at the elbow.  08/15/2022: Completes 90% of the task, proximal assist at times required at the elbow. 07/20/22: completes 90% of task, limited by shoulder flexion. 05/25/2022:  Pt. Is able to initiate and perform oral care for approximately 90% of the task. Complete set-up required. Assi needed only for the very back teeth. 03/23/2022: Pt. Is able to initiate and perform oral care  for approximately 75% of the task. Pt. Requires assist at proximally at the elbow for through oral care. 03/21/2022: Pt. Is able to initiate and perform oral care for approximately 75% of the task. Pt. Requires assist at proximally at the elbow for through oral care. Eval: Pt. is able to initiate using an electric toothbrush. Pt. requires assist for set-up, and assist for thoroughness, and as he Pt. fatigues. Goal status: Ongoing  3.  Pt. Will be modified independence with self-feeding for 100% using a swivel spoon, and standard fork after complete set-up Baseline: 01/09/2023: Continue 12/28/2022: Pt. Is now feeding himself 90% of the time using a swivel spoon with the left hand, and 95% of the time with the fork. Pt. Continues to have difficulty with cutting food. 11/16/2022: 100% with finger foods, Pt. Is initiating with a swivel spoon, and universal cuff for fork use, however requires assist for consistency, and accuracy.  09/26/2022: 90% using the spoon, assist at time with the forearm motion, and grip on the utensils. 100% for finger foods.  08/15/2022:  90% using the spoon, assit at time with the forearm motion, and grip on the utensils. 100% for finger foods-independent with set-up- unsupervised.  07/20/22: self-feeds  cereal using spoon 90% of task. 05/25/2022: Pt. Is able to use a spoon to scoop cereal when feeding himself cereal 85% of the time. Pt. Is able to feed himself snack/finger foods 100% of the time. Pt. Continues to work on consistency of  stabilizing a cup/mug when drinking. Pt. Is able to grasp a water bottle with assist initially, with assist tapering off as he drinks.03/23/2022: Pt. Is able to perform scooping cereal for 75% of the time. Pt. required assist, and support at the left elbow, and Pt. Presents with limited forearm supination when using the spoon, and bringing it towards his mouth. Pt. Is able to use a fork to spear items, and perform the hand to mouth pattern.  03/21/2022: Pt. Is able to perform scooping cereal for 75% of the time. Pt. required assist, and support at the left elbow, and Pt. presents with limited forearm supination when using the spoon, and bringing it towards his mouth. Pt. Is able to use a fork to spear items, and perform the hand to mouth pattern.  Eval: Pt. is able to hold standard standard utensils. Pt. Performs as much of the task as he, can and has assistance for the remainder. Goal status:  Met/Revised to 100% of the meal with a swivel spoon, and standard fork.  4.  Pt. Will improve grasp patterns and consistently grasp 1/4" objects for ADL, and IADL tasks.  Baseline: 01/09/2023: Continue 12/28/2022: Continues to grasp 1/4" pegs with min a + visual cues, consistently grasping 1/2" objects with visual cues 11/16/2022: Continues to grasp 1/4" pegs with min a + visual cues, consistently grasping 1/2" objects with visual cues 09/26/2022: Continues to grasp 1/4" pegs with min a + visual cues, consistently grasping 1/2" objects with visual cues. 08/15/2022: grasps 1/4" pegs with min a + visual cues, consistently grasping 1/2" objects with visual cues. 07/20/22: grasps 1/4" pegs with min a + visual cues, consistently grasping 1/2" objects with visual cues. 05/25/2022: Pt. Is working on  improving consistency of grasping 1/2" objects with visual cues.  03/23/2022: Pt. Is able to grasp 1" objects consistently,and continues to work on the hand patterns needed to grasp 1/2" objects.03/21/2022: Pt. Is able to grasp 1" objects consistently,and continues to work on the hand patterns needed to grasp 1/2"  objects. Eval: Pt. is able to grasp 1" objects intermittently using a lateral grasp pattern. Goal status: Ongoing  5.  Pt. will independently write his name legibly with letter sizes under 1". Baseline: 01/09/2023: Continue. 12/28/2022: Pt. is now using an IPad Pen. 11/16/2022: Pt. Continues to be able to write name with smaller letter size. Pt. Is signing name with a computer styus. Pt. Requires visual cues, and assist 09/26/2022: Pt. Continues to be able to write name with smaller letter size. Pt. Is signing name with a computer styus. Pt. Requires visual cues, and assist. 08/15/2022: Pt. Is able to write name with smaller letter size. Pt. Is signing name with a computer styus. Pt. Requires visual cues, and assist. 07/20/22: stabilizing assist to write name with 75% legibility with 2" letters. 05/25/2022: Pt. to continue to work towards formulating grasp patterns in preparation for grasping a large width pen. Pt. Requires visual cues. 03/23/2022: Pt. Is able to write his name with modA, however has difficulty with formulating letter sizes less than 2" in size with 50% legibility for the 3 letters of his name.03/21/2022: Pt. Is able to write his name with modA, however has difficulty with formulating letter sizes less than 2" in size with 50% legibility for the 3 letters of his name. Eval: Pt is able to hold a thin marker with his left hand, and formulate a line, and initiate a circular pattern (Pt. without glasses today) Goal status: Ongoing  6. Pt. Will reach up to comb/brush his hair with minA.  Baseline: 01/09/2023: Continue 12/28/2022: Pt. Is progressing, and is now able to use a hair pick with the  left hand on the right side of his head, top, and back of the head. Pt. Continues to have difficulty reaching further to the right side of his head with the left. 11/16/2022: Pt. Is able to use a hair pick for the left side of his head using his left hand. Pt. Presents with difficulty reaching to the right side of his head.09/26/2022: Pt. Is able to use a hair pick for the left side of his head using his left hand. Pt. Presents with difficulty reaching to the right side of his head. 5/13/202: 75% using a hair pick, assist to the right side of the head. 07/20/22: reaches 75% of head, assist for far R side of head, fatigues quickly. 05/25/2022: Pt. Is able to reach up with the left hand to the left side, top, and back of his head. 03/23/2022: Pt. is now able to more consistently initiate reaching up to his head with his left hand in preparation for haircare12/18/2023: Pt. is now able to more consistently initiate reaching up to his head with his left hand in preparation for haircare. Eval: Pt. is able to initiate reaching up for hair care with a long handled brush, however is unable to sustain UE's in elevation to perform the task.     Goal status: Progressing/Ongoing  7. Pt. Will independently navigate the w/c through his environment with minA with visual scanning, and hand placement on the controls.  Baseline: 11/16/2022: Deferred 2/2 vision 09/26/2022: Pt. Is now navigating his w/c ouside the home, and around his block. 08/15/2022: Pt. Requires minA to setup hand on controls and MIN cues to navigate the w/c in wide spaces, requires MIN - MOD cues to navigate the w/c through more narrow doorways, and tighter turns - varies based on good vs bad vision days.07/20/22: Pt. Requires minA to setup hand on controls and MIN cues  to navigate the w/c in wide spaces, requires MIN - MOD cues to navigate the w/c through more narrow doorways, and tighter turns - varies based on good vs bad vision days. 05/25/2022: Pt. Requires minA   and  cues to navigate the w/c in wide spaces, and requires Mod cues to navigate the w/c through more narrow doorways, and tighter turns. Pt. Requires max cues for scanning through the environment, and moderate cues for hand placement on the controls.     Goal status: Deferred 2/2 vision    8. Pt. Will improve bilateral grip strength to be able to independently grasp, and pull up blankets, and linens.   Baseline: 01/09/2023: Continue 12/28/2023: R: 0#, L: 5# Pt. Is now able to more consistently grasp and pull blankets up over him. 11/16/2022: R: 0 L: 8# Pt. Is more consistently grasping his blankets and attempting to pull them up. 09/26/2022: R: 0  L: 8# Pt. is attempting to grasp, and pull blankets up more, using mostly the left hand. 08/15/2022: Pt. has difficulty securely holding, and pulling up blankets, and linens.    Goal status: Partially met  9. Pt. will consistently actively control the releasing  of blankets, covers, and linens from his hands once they are in the desired position over him. Baseline:12/28/2022: Pt. Is consistently actively able to release linens once they are in the desired position over him. 11/16/2022: Pt. continues to improve with actively releasing blankets form his hands. 09/26/2022: Pt. is improving with actively releasing blankets form his hands. 08/15/2022: Pt. has difficulty with controlled releasing of blankets/linens from his grasp.     Goal status: Achieved    10. Pt. Will improve bilateral UE strength by 2 MM grades to assist with ADLs, and IADLs  Baseline: 01/09/2023: Continue 12/28/2022: Right: shoulder flexion: 3/5, abduction: 4/5, elbow flexion: 4+/5 , extension: 4-/5 wrist extension: 2/5; Left: shoulder flexion: 4+/5, abduction: 4+/5, elbow flexion: 5/5 , extension: 4/5  wrist extension: 3/5 Right: shoulder flexion: 3+/5, abduction: 4/5, elbow flexion: 4+/5 , extension: 4+/5 wrist extension: 2/5; Left: shoulder flexion: 4+/5, abduction: 4+/5, elbow flexion: 5/5 ,  extension: 4+/5 wrist extension: 3-/5 11/16/2022: Right: shoulder flexion: 3/5, abduction: 4/5, elbow flexion: 4+/5 , extension: 4+/5 wrist extension: 2/5; Left: shoulder flexion: 4+/5, abduction: 4+/5, elbow flexion: 5/5 , extension:  wrist extension: 3-/5 Right: shoulder flexion: 3+/5, abduction: 4/5, elbow flexion: 4+/5 , extension: 4+/5 wrist extension: 2/5; Left: shoulder flexion: 4+/5, abduction: 4+/5, elbow flexion: 5/5 , extension: 4+/5 wrist extension: 3-/5 Goal status: Ongoing  11. Pt. Will improve right wrist extension by 10 degrees to initiate reaching for items in preparation for ADLs. Baseline: 01/09/2023: Continue 12/28/2022: -22(34) 11/16/2022: -30(10)    Goal status: Progressing   ASSESSMENT:  CLINICAL IMPRESSION:  Pt. reports more consistent success with holding the swivel spoon using a gross grasp on the handle while performing hand to mouth patterns for cereal at home. Pt.was able to more consistently perform right gross digit extension when releasing objects from his hand to discard them onto elevated surfaces. Pt. required hand-over-hand assist  to place the clips into position for the lateral pinch with his right hand. Pt. continues to be highly motivated to continue to work towards  improving BUE functioning, ROM, strength, motor control, [facility] skills, as well as visual compensatory strategies in order to maximize independence with daily ADLs, and IADL functioning.      PERFORMANCE DEFICITS in functional skills including ADLs, IADLs, coordination, dexterity, proprioception, ROM, strength, pain, FMC, GMC, decreased  knowledge of use of DME, and UE functional use, cognitive skills including safety awareness, and psychosocial skills including coping strategies, environmental adaptation, habits, and routines and behaviors.   IMPAIRMENTS are limiting patient from ADLs, IADLs, leisure, and social participation.   COMORBIDITIES may have co-morbidities  that affects occupational  performance. Patient will benefit from skilled OT to address above impairments and improve overall function.  MODIFICATION OR ASSISTANCE TO COMPLETE EVALUATION: Maximum or significant modification of tasks or assist is necessary to complete an evaluation.  OT OCCUPATIONAL PROFILE AND HISTORY: Comprehensive assessment: Review of records and extensive additional review of physical, cognitive, psychosocial history related to current functional performance.  CLINICAL DECISION MAKING: High - multiple treatment options, significant modification of task necessary  REHAB POTENTIAL: Fair    EVALUATION COMPLEXITY: High    PLAN: OT FREQUENCY: 2 x's a week  OT DURATION:12 weeks  PLANNED INTERVENTIONS: self care/ADL training, therapeutic exercise, therapeutic activity, neuromuscular re-education, manual therapy, passive range of motion, patient/family education, and cognitive remediation/compensation RECOMMENDED OTHER SERVICES: PT  CONSULTED AND AGREED WITH PLAN OF CARE: Patient and family member/caregiver  PLAN FOR NEXT SESSION: Initiate treatment  Olegario Messier, MS, OTR/L   02/01/23, 5:45 PM

## 2023-02-02 NOTE — Therapy (Signed)
OUTPATIENT PHYSICAL THERAPY TREATMENT     Patient Name: Paul Hall MRN: [id number] DOB:July 09, 1995, 27 y.o., male Today's Date: 02/02/2023  PCP:  Cindi Carbon PROVIDER:  Lenise Herald, PA-C   PT End of Session - 02/01/23 1010     Visit Number 132    Number of Visits 146    Date for PT Re-Evaluation 02/22/23    Authorization Type [employer]BS COMM [employer]/ [place] Medicaid    Progress Note Due on Visit 140    PT Start Time 1530    PT Stop Time 1610    PT Time Calculation (min) 40 min    Equipment Utilized During Treatment Gait belt    Activity Tolerance Patient tolerated treatment well    Behavior During Therapy WFL for tasks assessed/performed                     Past Medical History:  Diagnosis Date   Diabetes mellitus (HCC)    Hypertension    Stroke (HCC)    Past Surgical History:  Procedure Laterality Date   IVC FILTER PLACEMENT ([facility] HX)     LEG SURGERY     PEG TUBE PLACEMENT     TRACHEOSTOMY     Patient Active Problem List   Diagnosis Date Noted   Sepsis due to vancomycin resistant Enterococcus species (HCC) 06/06/2019   SIRS (systemic inflammatory response syndrome) (HCC) 06/05/2019   Acute lower UTI 06/05/2019   VRE (vancomycin-resistant Enterococci) infection 06/05/2019   Anemia 06/05/2019   Skin ulcer of sacrum with necrosis of muscle (HCC)    Urinary retention    Type 2 diabetes mellitus without complication, with long-term current use of insulin (HCC)    Tachycardia    Lower extremity edema    Acute metabolic encephalopathy    Obstructive sleep apnea    Morbid obesity with BMI of 60.0-69.9, adult (HCC)    Goals of care, counseling/discussion    Palliative care encounter    Sepsis (HCC) 04/27/2019   H/O insulin dependent diabetes mellitus 04/27/2019   History of CVA with residual deficit 04/27/2019   Seizure disorder (HCC) 04/27/2019   Decubitus ulcer of sacral region, stage 4 (HCC) 04/27/2019    REFERRING DIAG: Cerebral infarction,  unspecified   THERAPY DIAG:  Muscle weakness (generalized)  Other lack of coordination  History of CVA with residual deficit  Abnormality of gait and mobility  Difficulty in walking, not elsewhere classified  Unsteadiness on feet  Rationale for Evaluation and Treatment Rehabilitation  PERTINENT HISTORY: Paul Hall is a 26yoM who presents with severe weakness, quadriparesis, altered sensorium, and visual impairment s/p critical illness and prolonged hospitalization. Pt hospitalized in October 2020 with ARDS 2/2 COVID19 infection. Pt sustained a complex and lengthy hospitalization which included tracheostomy, prolonged sedation, ECMO. In this period pt sustained CVA and SDH. Pt has now been liberated from tracheostomy and G-tube. Pt has since been hospitalized for wound infection and UTI. Pt lives with parents at home, has hospital bed and left chair, hoyer lift transfers, and power WC for mobility needs. Pt needs heavy physical assistance with ADL 2/2 BUE contractures and motor dysfunction   PRECAUTIONS: Fall  SUBJECTIVE:  Patient reports his ankle is sore but doing okay otherwise     PAIN:  Are you having pain? No   TODAY'S TREATMENT:  - 02/01/2023     *Dependent transfer from w/c to edge of mat using w/c   THEREX: (all therex performed seated at edge of mat)  Active ankle DF/PF x 15 reps Active knee ext/flex x 15 reps each LE Active hip march  x 15 reps each LE Manual stretching to BLE- ankle/knee/hips  Therapeutic Activities- Attempted to stand and patient perform 1 x and able to stand several seconds- clearing bottom off edge of mat.   Patient requested to go back to chair after sitting for few moments while exercising as above- Stated he was lightheaded and his eyes were a little blurry. He did improve with no further symptoms later in visit.       PATIENT EDUCATION: Education details: Progression of intensity at home with trunk strength exercises and  how to grade intensity using a goniometer bubble app.   HOME EXERCISE PROGRAM:  Access Code: [email or web address] URL: [email or web address] Date: 03/23/2022 Prepared by: Paul Hall  Exercises - Supine Bridge  - 3 x weekly - 3 sets - 10 reps - 2 hold - Supine Gluteal Sets  - 3 x weekly - 3 sets - 10 reps - 5 sec hold - Supine Quad Set  - 1 x daily - 3 x weekly - 3 sets - 10 reps - 5 hold - Seated Long Arc Quad  - 1 x daily - 7 x weekly - 3 sets - 10 reps - Seated Hip Adduction Squeeze with Ball  - 1 x daily - 3 x weekly - 3 sets - 10 reps - 5 hold - Seated Hip Abduction  - 1 x daily - 3 x weekly - 3 sets - 10 reps - 2 hold    PT Short Term Goals -       PT SHORT TERM GOAL #1   Title Pt will be independent with HEP in order to improve strength and balance in order to decrease fall risk and improve function at home and work.    Baseline 01/04/2021= No formal HEP in place; 12/12 no HEP in place; 05/10/2021-Patient and his father were able to report compliance with curent HEP consisting of mostly seated/reclined LE strengthening. Both verbalize no questions at this time.    Time 6    Period Weeks    Status Achieved    Target Date 02/15/21            PT LONG TERM GOAL #1     Title Patient will increase BLE gross strength by 1/2 muscle grade to improve functional strength for improved independence with potential gait, increased standing tolerance and increased ADL ability.     Baseline 01/04/2021- Patient presents with 1/5 to 3-/5 B LE strength with MMT; 12/12: goal partially met for Left knee/hip; 05/10/2021= 2-/5 bilateal Hip flex; 3+/5 bilateral Knee ext; 06/21/2021= Patient presents with 2-/5 bilateral Hip flex; 3+/5 bilateral knee ext/flex; 2-/5 left ankle DF; 0/5 right ankle- and able to increase reps and resistance with LE's. 09/15/2021- Patient technically presents with 2-/5 B hip flex/abd/add - but he is able to raise his hip up to approx 100 deg which has improved. 3+/5  Bilateral knee ext, 2-/5 left ankle and 0/5 right ankle.  12/08/2021= Patient able to lift left knee at 110 deg of hip flex; presents with 3+/5 knee ext, 2-/5 left ankle DF and 0/5 right ankle DF, 2-/5 bilateral Hip abd in seated position.    12/6: R: knee 3+/5 ext, 2/5 flexion, left knee 3+/5 extension, 3+/5 flexion, R hip: 2+/5 hip add, 2+/5 hip ABD L hip: 4-/5 hip ABD, 3+/5 hip ADD, 3+/5 hip flexion; 06/06/2022= Patient now presents with 2-/5 right ankle DF/PF;  Time 12     Period Weeks     Status MET    Target Date 03/07/2022         PT LONG TERM GOAL #2    Title Patient will tolerate sitting unsupported demonstrating erect sitting posture for 15 minutes with CGA to demonstrate improved back extensor strength and improved sitting tolerance.     Baseline 01/04/2021- Patient confied to sitting in lift chair or electric power chair with back support and unable to sit upright without physical assistance; 12/12: tolerates <1 minutes upright unsupported sitting. 05/10/2021=static sit with forward trunk lean  in his power wheelchair without back support x approx 3 min. 06/21/2021=Unable to assess today due to patient with acute back pain but on previous visit able to sit x 8 min without back support. 09/15/2021- on last visit- 09/13/2021- patient was able to sit unsupported x 8 min at edge of mat. 10/13/2023 - Patient was able to sit at edge of mat with varying level of assist today from SBA to min A for a total of 20 min. 12/13/2021= Patient demonstrated unsupported sitting at edge of mat for approx 20 min    Time 12     Period Weeks     Status GOAL MET    Target Date 12/08/21          PT LONG TERM GOAL #3    Title Patient will demonstrate ability to perform static standing in // bars > 2 min with Max Assist  without loss of balance and fair posture for improved overall strength for pre-gait and transfer activities.     Baseline 01/04/2021= Patient current uanble to stand- Dependent on hoyer or sit to stand  lift for transfers. 05/10/2021=Not appropriate yet- Currently still dependent with all transfers using hoyer. 06/21/2021= Patient continuing now to focus on LE strengthening to prepare for standing-unable to try today due to acute low back pain-  planning on attempting in new cert period. 09/15/2021- Patient has attempted standing 2x in past two week- max Assist of 2 people - only once was he successful to clearing his bottom from chair - Will continue to be a focus during the new certification. 12/13/2021= Patient has been limited secondary to increased overall low back pain during this certification and will require more time to focus on this goal.  12/6: not assessed this date, will assess at date when 2-3 PTs are present for assistance     Time 12     Period Weeks     Status GOAL not appropriate at this time - may attempt in future once Patient presents with improved overall LE strength.                 PT LONG TERM GOAL #4    Title Pt will improve FOTO score by 10 points or more demonstrating improved perceived functional ability     Baseline FOTO 7 on 10/17; 03/15/21: FOTO 12; 05/10/2021 06/21/2021= 1; 09/15/2021= 9; 12/13/2021= Will issue next visit 12/6: 4; 06/06/2022= Will assess next visit; Goal not appropriate as patient is not ambulatory.     Time 12     Period Weeks     Status WITHDRAWN    Target date 11/30/2022         PT LONG TERM GOAL #5    Title Patient will perform sit to stand transfer with appropriate AD and max assist of 2 people with 75% consistency to prepare for pregait activities.     Baseline 09/15/2021=  Patient unable to stand well- unable to clear his bottom off chair with Max assist of 2 persons. 12/13/2021- Goal not appropriate to try yet but will keep and roll over to next cert as shift continues to focus on transfers/standing; 4/24= Patient able to perform active ankle DF/PF with right LE and able to raise his knee into seated march and clear floor without physical assist today - as  previously unable as well as lift right knee ext to near full ROM to improve strength for eventual standing. 09/07/2022- Goal continues to not be appropriate but patient is now standing some and will keep goal active; 11/23/2022= patient is now performing sit to stand with max A +2 and now able to clear his bottom off mat. 11/30/2022- Patient continues to perform sit to stand transfers with Max A+2 and able to clear bottom well off mat but not erect yet-     Time 12     Period Weeks     Status PROGRESSING    Target date  02/22/2023       PT LONG TERM GOAL #6  Title Patient will tolerate sitting unsupported demonstrating erect sitting posture for 30 minutes with CGA to demonstrate improved back extensor strength and improved sitting tolerance.   Baseline 12/13/2021= Patient demonstrated unsupported sitting at edge of mat for approx 20 min;  12/29/2021- Patient performed approx 30 min of dynamic sitting activities today. 06/06/2022= Patient demonstrated ability to sit and perform static and dynamic UE/LE movement with only Supervision.   Time 12   Period Weeks   Status GOAL MET           7.  Patient will tolerate 2 minutes or more of standing in sit to stand lift or with max assist +2 in order to indicate improved lower extremity weightbearing tolerance for progression to standing in parallel bars. Baseline: 1 minute on most recent stand 02/21/22; 06/06/2022- Patient did attempt today after complaining of right LE pain last week. He attempted 3 stands using sit to stand lift. - all over 1 min- last one approx 48 sec- stopped due to fatigue. More erect standing in lift today - still poor gluteal strength but able to activate glutes and extend hips upon command but unable to hold > 5 sec. 07/28/2022- Will attempt standing next visit but patient has been able to stand since last progress note for up to 3 min at a time at his best. 09/07/2022-  attempted standing with max +2 and patient able to stand 15-20 sec so  will now  revise goal; 11/23/2022- Patient at his best between last 2 visits was able to stand approx 50 sec with max A+2.  11/30/2022- Patient performed 4 trials of static standing - max A +2- clearing bottom and using forearms to bear weight  Goal status: Revised Target date: 02/22/2023   8. Patient will tolerate sitting unsupported demonstrating ability to perform dynamic UE/LE activities for 30 minutes  independently to demonstrate ability to sit at edge of bed to eat or perform some ADL's/exercise for optimal quality of life.             Baseline: 06/06/2022- Patient able to sit and perform some UE/LE exercises but requires CGA at times for safety- he is able to static sit for 30 min with supervision. 07/27/2022= Patient able to now sit > 30 min with Supervision only - performing static and dynamic activities. Will keep goal active to ensure patient is consistent. 09/07/2022=  Patient able  to demonstrate consistent ability to sit at edge of mat during visits             Goal Status: MET             Target date: 08/29/2022        Plan     Clinical Impression Statement  Patient arrived with good motivation for session but experienced some difficulty during session- difficulty with eyes going blurry and some brief report of dizziness- much improved after receiving eye drops from his father and a few min of rest. Continued to work with his flexibility as he presents with tight proximal LE.   Pt will continue to benefit from skilled physical therapy intervention to address impairments, improve QOL, and attain therapy goals.    Personal Factors and Comorbidities Comorbidity 3+;Time since onset of injury/illness/exacerbation    Comorbidities CVA, diabetes, Seizures    Examination-Activity Limitations Bathing;Bed Mobility;Bend;Caring for Others;Carry;Dressing;Hygiene/Grooming;Lift;Locomotion Level;Reach Overhead;Self Feeding;Sit;Squat;Stairs;Stand;Transfers;Toileting    Examination-Participation Restrictions  Cleaning;Community Activity;Driving;Laundry;Medication Management;Meal Prep;Occupation;Personal Finances;Shop;Yard Work;Volunteer    Stability/Clinical Decision Making Evolving/Moderate complexity    Rehab Potential Fair    PT Frequency 2x / week    PT Duration 12 weeks    PT Treatment/Interventions ADLs/Self Care Home Management;Cryotherapy;Electrical Stimulation;Moist Heat;Ultrasound;DME Instruction;Gait training;Stair training;Functional mobility training;Therapeutic exercise;Balance training;Patient/family education;Orthotic Fit/Training;Neuromuscular re-education;Wheelchair mobility training;Manual techniques;Passive range of motion;Dry needling;Energy conservation;Taping;Visual/perceptual remediation/compensation;Joint Manipulations    PT Next Visit Plan core strength/motor control in short-sitting, sit to stand if able; Continue with progressive LE Strengthening. Stand activities as appropriate.     PT Home Exercise Plan No changes to HEP today    Consulted and Agree with Plan of Care Patient;Family member/caregiver    Family Member Consulted            10:33 AM, 02/02/23 Louis Meckel, PT Physical Therapist - [facility]  Outpatient Physical Therapy- [facility] [phone]      02/02/2023, 10:33 AM

## 2023-02-06 ENCOUNTER — Ambulatory Visit: Payer: Medicare Other | Admitting: Occupational Therapy

## 2023-02-06 ENCOUNTER — Ambulatory Visit: Payer: BC Managed Care – PPO | Attending: Physician Assistant

## 2023-02-06 DIAGNOSIS — M6281 Muscle weakness (generalized): Secondary | ICD-10-CM

## 2023-02-06 DIAGNOSIS — I6930 Unspecified sequelae of cerebral infarction: Secondary | ICD-10-CM | POA: Insufficient documentation

## 2023-02-06 DIAGNOSIS — R2689 Other abnormalities of gait and mobility: Secondary | ICD-10-CM | POA: Insufficient documentation

## 2023-02-06 DIAGNOSIS — R278 Other lack of coordination: Secondary | ICD-10-CM | POA: Diagnosis not present

## 2023-02-06 DIAGNOSIS — R2681 Unsteadiness on feet: Secondary | ICD-10-CM | POA: Diagnosis not present

## 2023-02-06 DIAGNOSIS — R262 Difficulty in walking, not elsewhere classified: Secondary | ICD-10-CM | POA: Insufficient documentation

## 2023-02-06 DIAGNOSIS — R269 Unspecified abnormalities of gait and mobility: Secondary | ICD-10-CM | POA: Insufficient documentation

## 2023-02-06 NOTE — Therapy (Addendum)
Occupational Therapy Neuro Treatment Note  Patient Name: Paul Hall MRN: [id number] DOB:1996/02/16, 27 y.o., male Today's Date: 02/06/2023  PCP: Dr. Sherwood Gambler REFERRING PROVIDER: Dr. Sherwood Gambler   OT End of Session - 02/06/23 1719     Visit Number [id number]    Number of Visits 84    Date for OT Re-Evaluation 02/08/23    OT Start Time 1620    OT Stop Time 1700    OT Time Calculation (min) 40 min    Activity Tolerance Patient tolerated treatment well    Behavior During Therapy WFL for tasks assessed/performed                  Past Medical History:  Diagnosis Date   Diabetes mellitus (HCC)    Hypertension    Stroke [facility])    Past Surgical History:  Procedure Laterality Date   IVC FILTER PLACEMENT ([facility] HX)     LEG SURGERY     PEG TUBE PLACEMENT     TRACHEOSTOMY     Patient Active Problem List   Diagnosis Date Noted   Sepsis due to vancomycin resistant Enterococcus species (HCC) 06/06/2019   SIRS (systemic inflammatory response syndrome) (HCC) 06/05/2019   Acute lower UTI 06/05/2019   VRE (vancomycin-resistant Enterococci) infection 06/05/2019   Anemia 06/05/2019   Skin ulcer of sacrum with necrosis of muscle (HCC)    Urinary retention    Type 2 diabetes mellitus without complication, with long-term current use of insulin (HCC)    Tachycardia    Lower extremity edema    Acute metabolic encephalopathy    Obstructive sleep apnea    Morbid obesity with BMI of 60.0-69.9, adult (HCC)    Goals of care, counseling/discussion    Palliative care encounter    Sepsis (HCC) 04/27/2019   H/O insulin dependent diabetes mellitus 04/27/2019   History of CVA with residual deficit 04/27/2019   Seizure disorder (HCC) 04/27/2019   Decubitus ulcer of sacral region, stage 4 (HCC) 04/27/2019   ONSET DATE: 01/2019  REFERRING DIAG: CVA/COVID-19  THERAPY DIAG:  Muscle weakness (generalized)  Rationale for Evaluation and Treatment Rehabilitation  SUBJECTIVE:   SUBJECTIVE  STATEMENT:  Pt. reports doing well today.   Pt accompanied by: self and family member  PERTINENT HISTORY:  Pt. is a 27 y.o. male who was diagnosed with COVID-19, and CVA with resultant quadriplegia. Pt. Was then hospitalized with VRE UTI. PMHx includes: urinary retention, seizure disorder, obstructive sleep disorder, DM Type II, Morbid obesity.   PRECAUTIONS: None  WEIGHT BEARING RESTRICTIONS No  PAIN:  Are you having pain? No reports of pain 2/10  FALLS: Has patient fallen in last 6 months? No  LIVING ENVIRONMENT: Lives with: lives with their family Lives in: House/apartment Stairs: No Level Entry Has following equipment at home: Wheelchair (power) and hoyer lift, sit to stand lift  PLOF: Independent  PATIENT GOALS: To be able to engage in more daily care tasks.  OBJECTIVE:   HAND DOMINANCE: Right  ADLs:  Transfers/ambulation related to ADLs: Eating: Pt. reports being able to hold standard utensils, and is starting to engage more in self-feeding tasks, hand to mouth patterns. Pt. Reports that he does as much as he can with the task, and family assists  with the remainder of the task. Grooming: Pt. Is able to initiate holding an electric toothbrush, and brush his teeth. Family assists LB Dressing: Total Assist UE dressing: Pt. is now able to reach up to actively assist with grasping , and pulling  his gown down. Toileting:  Total Assist Bathing: MaxA UB, Total assist LB Tub Shower transfers: N/A Equipment: See above    IADLs: Shopping: Relies on family to assist Light housekeeping: Total Assist Meal Prep: Total Assist Community mobility:   Medication management:  Total Assist  Financial management: N/A Handwriting: Not legible: Pt. Is able to hold a pen with the left hand, and initiate marking the page. Pt.'s eye glasses were not available  MOBILITY STATUS:  Power w/c  POSTURE COMMENTS:  Pt. Requires position changes in his power w/c  ACTIVITY  TOLERANCE: Activity tolerance:  Fair  FUNCTIONAL OUTCOME MEASURES: FOTO: 40  TR score: 45  05/25/2022:   FOTO: 50 TR score: 45  07/20/22: FOTO 55  UPPER EXTREMITY ROM     Active ROM Left eval Left  03/21/2022 Left 05/25/2022 L  07/20/22 Left 07/25/2022 Left  08/15/2022 Left  09/26/2022 Left  11/16/2022 Left 12/28/2022  Shoulder flexion 119 123 125 130  136 138 130 142  Shoulder abduction 110 115 115 115  118 121 125 142  Shoulder adduction           Shoulder extension           Shoulder internal rotation           Shoulder external rotation           Elbow flexion 135(135) 145 145 145  145     Elbow extension -27(-18) -21(-20) -20(-16)  -25(-10) -23(-10) -21(-10) -20(-18) -20(-18)  Wrist flexion           Wrist extension 10(50) 19(50) 28(50)  30(50) 35(55) 36(55) 36(60) 40(60)  Wrist ulnar deviation           Wrist radial deviation           Wrist pronation           Wrist supination   Limited by flexor tone        (Blank rows = not tested)    Active ROM Right eval Right 03/21/2022 Right 05/25/2022 R  07/20/22 Right  07/25/22 Right 08/15/2022 Right 09/26/2022 Right 11/16/2022 Right 12/28/2022  Shoulder flexion 106 scaption 105  Scaption 62 flexion 110 scaption 100  105 105 105 112 Scaption  Shoulder abduction 114 90 97 100  105 117 120 124  Shoulder adduction           Shoulder extension           Shoulder internal rotation           Shoulder external rotation           Elbow flexion 120(130) 145 145 140  144 140 142 148  Elbow extension -45(-35) -22(-35) -28(-22)  -32(-21) -32(-28) -28(-26) -28(-28) -27(-26)  Wrist flexion           Wrist extension -30(10) -25(10) -10(30)  -10(20) -10(25) -20(40) -30(10) -22(34)  Wrist ulnar deviation           Wrist radial deviation           Wrist pronation           Wrist supination   Limited by flexor tone          12/28/2022: Thumb radial abduction: Right: 12(46), Left: 40(54)  Digits: Bilateral 2nd through 5th digit: MP  Hyperextension with PIP, an DIP flexion     UPPER EXTREMITY MMT:     Left eval Left 03/21/2022 Left  05/25/2022 L 07/20/22 Left  08/15/2022 Left 09/26/2022 Left 11/16/2022 Left  12/28/2022  Shoulder flexion 3-/5 3/5 3+/5 4+/5 4+/5 4+/5 4+/5 4+/5  Shoulder abduction 3-/5 3-/5 4-/5 4+/5 4+/5 4+5 4+/5 4+/5  Shoulder adduction          Shoulder extension          Shoulder internal rotation          Shoulder external rotation          Elbow flexion 3/5 3+/5 4-/5 4+/5 5/5 5/5 5/5 5/5  Elbow extension 2-/5 2/5  4+/5 4+/5 4+/5  4/5  Wrist flexion          Wrist extension 2/5 2+/5 3-/5  3-/5 3-/5 3-/5 3/5  Wrist ulnar deviation          Wrist radial deviation          Wrist pronation          Wrist supination          (Blank rows = not tested)  Right eval Right 03/21/2022 Right  05/25/2022 R 07/20/22 Right 08/15/2022 Right 09/26/2022 Right 11/16/2022 Right 12/28/2022  Shoulder flexion 3-/5 scaption 3-/5 3-/5 3+/5 3+/5 3+/5 3/5 3/5  Shoulder abduction 3-/5 3-/5 3-/5 4/5 4/5 4/5 4/5 4/5  Shoulder adduction          Shoulder extension          Shoulder internal rotation          Shoulder external rotation          Elbow flexion 3/5 3/5 3+/5 4/5 4+/5 4+/5 4+/5 4+/5  Elbow extension 2-/5 2/5 3-/5 4+/5 4+/5 4+/5 4+/5 4-/5  Wrist flexion          Wrist extension 2-/5 2-/5 2/5 2/5 2/5 2/5 2/5 2/5  Wrist ulnar deviation          Wrist radial deviation          Wrist pronation          Wrist supination            HAND FUNCTION:  Grip strength: Right: 0 lbs; Left: 0 lbs and Lateral pinch: Right: 5 lbs, Left: 2 lbs  03/21/2022: Lateral pinch: Right: 3.5 lbs, Left: 2 lbs  07/20/22: Grip strength: Right: 3 lbs; Left: 4 lbs and Lateral pinch: Right: 5 lbs, Left: 3 lbs  08/15/2022:  Grip strength: Right: 0 lbs; Left: 5 lbs and Lateral pinch: Right: 2 lbs, Left: 4 lbs  09/26/2022  Grip strength: Right: 0 lbs; Left: 8 lbs and Lateral pinch: Right: 2 lbs, Left: 5 lbs  11/16/2022  Grip  strength: Right: 0 lbs; Left: 8 lbs  9/25/20204  Grip strength: Right: 0 lbs; Left: 8 lbs    Bilateral digit PIP/DIP flexion contractures with MP hyperextension with attempts for AROM. Pt. is able to tolerate AROM to the bilateral digits at the initial evaluation however, has a history of pain in the digits.  COORDINATION: Eval: Pt. is unable to grasp 9-hole test pegs. Pt. is able to initiate grasping larger pegs, and is able to hold a pen in the left hand.  07/20/22: 2 min 36 seconds to remove 9 pegs from 9 hole peg test - cues to locate pegs 2/2 low vision. Pt. is able to initiate grasping larger pegs on R hand and is able to hold a pen in the left hand.  08/15/2022:      Vision, and sensation limiting accuracy of 9 hole peg test results. Pt. Was able to grasp and remove vertical pegs intermittently with cues.  SENSATION: Light touch: Impaired   EDEMA:  N/A  MUSCLE TONE: BUE flexor Spasticity  COGNITION Overall cognitive status: Continue to assess in functional context  VISION:   Subjective report: Pt. was not wearing glasses at the time of the initial eval.  Baseline vision: Vision is very limited. Wears glasses all the time Visual history: History of impaired vision following CVA. Pt. Has received treatment through the [facility] low vision rehabilitation program.   VISION ASSESSMENT: Impaired To be further assessed in functional context  PERCEPTION: Impaired   PRAXIS: Impaired: motor planning  OBSERVATIONS:  Pt reports being on Tramadol   TODAY'S TREATMENT     Therapeutic Ex.:  Pt. performed AROM for right shoulder flexion, abduction. Emphasis was placed on slow prolonged gentle passive stretching for RUE, forearm supination, bilateral wrist extension, wrist extension, digit MP, PIP, and DIP extension following moist heat. ROM was performed to decrease stiffness, and prepare the RUE for functional use. ROM was performed following moist heat to the shoulder right  elbow, forearm, wrist, and digits. PROM for bilateral digit MP, PIP, and DIP extension in preparation for placing them into a flat position for weightbearing using a foam block. Pt. worked on right hand lateral pinch strengthening followed by reps of reaching out with right elbow extension, and extending his digits to release them onto an elevated surface. Pt. was able to complete more reps with the red, and green resistive clips.  Manual therapy:   Pt. tolerated soft tissue mobilizations for right radius on ulna to facilitate supination 2/2 increased tone, and tightness. Manual therapy was performed independent of, and in preparation for There. Ex.   PATIENT EDUCATION: Education details: Doctor, general practice and fork positioning for self feeding Person educated: Patient and Parent Education method: Explanation, Demonstration, Tactile cues, and Verbal cues Education comprehension: verbalized understanding, returned demonstration, verbal cues required, and needs further education  HOME EXERCISE PROGRAM:  Continue ongoing assessment, and continue to provide as needed.   GOALS: Goals reviewed with patient? Yes  SHORT TERM GOALS: Target date: 11/07/2022   To assess splint fit, and make appropriate adjustments to promote good skin integrity through the palmar surface of the bilateral hands.  Baseline: 05/25/22: Goal currently met, however ongoing as needs to assess splint fit arise. 03/23/2022: Pt. is wearing splints a couple of hours at night bilateral resting hand splints. 03/21/2022: Pt. is wearing splints a couple of hours at night bilateral resting hand splints. Goal status: Deferred   LONG TERM GOALS: Target date: 02/08/2023    FOTO score will Improve by 2 points for Pt. perceived improvement with the assessment specific ADL/IADL tasks.  Baseline: 12/28/2022: FOTO score: 56; 11/16/2022: 52 09/27/2022: 51 07/20/2022: FOTO 55 05/25/2022: FOTO score: 50,  TR score: 45 Eval: FOTO score: 40,  TR score:  45 Goal status: Achieved   2.   Pt. will independently perform oral care for 100% of the task after complete set-up. Baseline: 01/09/2023: Continue 12/28/2022:  Pt. Continues to complete 90% of the task with complete set-up, with visual cues, and occassional assist of the right elbow. 11/16/2022: Pt. Completes 90% of the task with complete set-up. 09/26/2022: Completes 90% of the task, proximal assist at times required at the elbow.  08/15/2022: Completes 90% of the task, proximal assist at times required at the elbow. 07/20/22: completes 90% of task, limited by shoulder flexion. 05/25/2022:  Pt. Is able to initiate and perform oral care for approximately 90% of the task. Complete set-up required. Assi needed only for  the very back teeth. 03/23/2022: Pt. Is able to initiate and perform oral care for approximately 75% of the task. Pt. Requires assist at proximally at the elbow for through oral care. 03/21/2022: Pt. Is able to initiate and perform oral care for approximately 75% of the task. Pt. Requires assist at proximally at the elbow for through oral care. Eval: Pt. is able to initiate using an electric toothbrush. Pt. requires assist for set-up, and assist for thoroughness, and as he Pt. fatigues. Goal status: Ongoing  3.  Pt. Will be modified independence with self-feeding for 100% using a swivel spoon, and standard fork after complete set-up Baseline: 01/09/2023: Continue 12/28/2022: Pt. Is now feeding himself 90% of the time using a swivel spoon with the left hand, and 95% of the time with the fork. Pt. Continues to have difficulty with cutting food. 11/16/2022: 100% with finger foods, Pt. Is initiating with a swivel spoon, and universal cuff for fork use, however requires assist for consistency, and accuracy.  09/26/2022: 90% using the spoon, assist at time with the forearm motion, and grip on the utensils. 100% for finger foods.  08/15/2022:  90% using the spoon, assit at time with the forearm motion, and grip  on the utensils. 100% for finger foods-independent with set-up- unsupervised.  07/20/22: self-feeds cereal using spoon 90% of task. 05/25/2022: Pt. Is able to use a spoon to scoop cereal when feeding himself cereal 85% of the time. Pt. Is able to feed himself snack/finger foods 100% of the time. Pt. Continues to work on consistency of  stabilizing a cup/mug when drinking. Pt. Is able to grasp a water bottle with assist initially, with assist tapering off as he drinks.03/23/2022: Pt. Is able to perform scooping cereal for 75% of the time. Pt. required assist, and support at the left elbow, and Pt. Presents with limited forearm supination when using the spoon, and bringing it towards his mouth. Pt. Is able to use a fork to spear items, and perform the hand to mouth pattern.  03/21/2022: Pt. Is able to perform scooping cereal for 75% of the time. Pt. required assist, and support at the left elbow, and Pt. presents with limited forearm supination when using the spoon, and bringing it towards his mouth. Pt. Is able to use a fork to spear items, and perform the hand to mouth pattern.  Eval: Pt. is able to hold standard standard utensils. Pt. Performs as much of the task as he, can and has assistance for the remainder. Goal status:  Met/Revised to 100% of the meal with a swivel spoon, and standard fork.  4.  Pt. Will improve grasp patterns and consistently grasp 1/4" objects for ADL, and IADL tasks.  Baseline: 01/09/2023: Continue 12/28/2022: Continues to grasp 1/4" pegs with min a + visual cues, consistently grasping 1/2" objects with visual cues 11/16/2022: Continues to grasp 1/4" pegs with min a + visual cues, consistently grasping 1/2" objects with visual cues 09/26/2022: Continues to grasp 1/4" pegs with min a + visual cues, consistently grasping 1/2" objects with visual cues. 08/15/2022: grasps 1/4" pegs with min a + visual cues, consistently grasping 1/2" objects with visual cues. 07/20/22: grasps 1/4" pegs with min a  + visual cues, consistently grasping 1/2" objects with visual cues. 05/25/2022: Pt. Is working on improving consistency of grasping 1/2" objects with visual cues.  03/23/2022: Pt. Is able to grasp 1" objects consistently,and continues to work on the hand patterns needed to grasp 1/2" objects.03/21/2022: Pt. Is able to grasp  1" objects consistently,and continues to work on the hand patterns needed to grasp 1/2" objects. Eval: Pt. is able to grasp 1" objects intermittently using a lateral grasp pattern. Goal status: Ongoing  5.  Pt. will independently write his name legibly with letter sizes under 1". Baseline: 01/09/2023: Continue. 12/28/2022: Pt. is now using an IPad Pen. 11/16/2022: Pt. Continues to be able to write name with smaller letter size. Pt. Is signing name with a computer styus. Pt. Requires visual cues, and assist 09/26/2022: Pt. Continues to be able to write name with smaller letter size. Pt. Is signing name with a computer styus. Pt. Requires visual cues, and assist. 08/15/2022: Pt. Is able to write name with smaller letter size. Pt. Is signing name with a computer styus. Pt. Requires visual cues, and assist. 07/20/22: stabilizing assist to write name with 75% legibility with 2" letters. 05/25/2022: Pt. to continue to work towards formulating grasp patterns in preparation for grasping a large width pen. Pt. Requires visual cues. 03/23/2022: Pt. Is able to write his name with modA, however has difficulty with formulating letter sizes less than 2" in size with 50% legibility for the 3 letters of his name.03/21/2022: Pt. Is able to write his name with modA, however has difficulty with formulating letter sizes less than 2" in size with 50% legibility for the 3 letters of his name. Eval: Pt is able to hold a thin marker with his left hand, and formulate a line, and initiate a circular pattern (Pt. without glasses today) Goal status: Ongoing  6. Pt. Will reach up to comb/brush his hair with minA.  Baseline:  01/09/2023: Continue 12/28/2022: Pt. Is progressing, and is now able to use a hair pick with the left hand on the right side of his head, top, and back of the head. Pt. Continues to have difficulty reaching further to the right side of his head with the left. 11/16/2022: Pt. Is able to use a hair pick for the left side of his head using his left hand. Pt. Presents with difficulty reaching to the right side of his head.09/26/2022: Pt. Is able to use a hair pick for the left side of his head using his left hand. Pt. Presents with difficulty reaching to the right side of his head. 5/13/202: 75% using a hair pick, assist to the right side of the head. 07/20/22: reaches 75% of head, assist for far R side of head, fatigues quickly. 05/25/2022: Pt. Is able to reach up with the left hand to the left side, top, and back of his head. 03/23/2022: Pt. is now able to more consistently initiate reaching up to his head with his left hand in preparation for haircare12/18/2023: Pt. is now able to more consistently initiate reaching up to his head with his left hand in preparation for haircare. Eval: Pt. is able to initiate reaching up for hair care with a long handled brush, however is unable to sustain UE's in elevation to perform the task.     Goal status: Progressing/Ongoing  7. Pt. Will independently navigate the w/c through his environment with minA with visual scanning, and hand placement on the controls.  Baseline: 11/16/2022: Deferred 2/2 vision 09/26/2022: Pt. Is now navigating his w/c ouside the home, and around his block. 08/15/2022: Pt. Requires minA to setup hand on controls and MIN cues to navigate the w/c in wide spaces, requires MIN - MOD cues to navigate the w/c through more narrow doorways, and tighter turns - varies based on good vs  bad vision days.07/20/22: Pt. Requires minA to setup hand on controls and MIN cues to navigate the w/c in wide spaces, requires MIN - MOD cues to navigate the w/c through more narrow  doorways, and tighter turns - varies based on good vs bad vision days. 05/25/2022: Pt. Requires minA  and  cues to navigate the w/c in wide spaces, and requires Mod cues to navigate the w/c through more narrow doorways, and tighter turns. Pt. Requires max cues for scanning through the environment, and moderate cues for hand placement on the controls.     Goal status: Deferred 2/2 vision    8. Pt. Will improve bilateral grip strength to be able to independently grasp, and pull up blankets, and linens.   Baseline: 01/09/2023: Continue 12/28/2023: R: 0#, L: 5# Pt. Is now able to more consistently grasp and pull blankets up over him. 11/16/2022: R: 0 L: 8# Pt. Is more consistently grasping his blankets and attempting to pull them up. 09/26/2022: R: 0  L: 8# Pt. is attempting to grasp, and pull blankets up more, using mostly the left hand. 08/15/2022: Pt. has difficulty securely holding, and pulling up blankets, and linens.    Goal status: Partially met  9. Pt. will consistently actively control the releasing  of blankets, covers, and linens from his hands once they are in the desired position over him. Baseline:12/28/2022: Pt. Is consistently actively able to release linens once they are in the desired position over him. 11/16/2022: Pt. continues to improve with actively releasing blankets form his hands. 09/26/2022: Pt. is improving with actively releasing blankets form his hands. 08/15/2022: Pt. has difficulty with controlled releasing of blankets/linens from his grasp.     Goal status: Achieved    10. Pt. Will improve bilateral UE strength by 2 MM grades to assist with ADLs, and IADLs  Baseline: 01/09/2023: Continue 12/28/2022: Right: shoulder flexion: 3/5, abduction: 4/5, elbow flexion: 4+/5 , extension: 4-/5 wrist extension: 2/5; Left: shoulder flexion: 4+/5, abduction: 4+/5, elbow flexion: 5/5 , extension: 4/5  wrist extension: 3/5 Right: shoulder flexion: 3+/5, abduction: 4/5, elbow flexion: 4+/5 , extension:  4+/5 wrist extension: 2/5; Left: shoulder flexion: 4+/5, abduction: 4+/5, elbow flexion: 5/5 , extension: 4+/5 wrist extension: 3-/5 11/16/2022: Right: shoulder flexion: 3/5, abduction: 4/5, elbow flexion: 4+/5 , extension: 4+/5 wrist extension: 2/5; Left: shoulder flexion: 4+/5, abduction: 4+/5, elbow flexion: 5/5 , extension:  wrist extension: 3-/5 Right: shoulder flexion: 3+/5, abduction: 4/5, elbow flexion: 4+/5 , extension: 4+/5 wrist extension: 2/5; Left: shoulder flexion: 4+/5, abduction: 4+/5, elbow flexion: 5/5 , extension: 4+/5 wrist extension: 3-/5 Goal status: Ongoing  11. Pt. Will improve right wrist extension by 10 degrees to initiate reaching for items in preparation for ADLs. Baseline: 01/09/2023: Continue 12/28/2022: -22(34) 11/16/2022: -30(10)    Goal status: Progressing   ASSESSMENT:  CLINICAL IMPRESSION:  Pt. and his mother report that he is now using his RUE more during self feeding, primarily grasping flat crackers and bringing them to his mouth. Pt. is able to [place] his fingers to slide them to the edge of the table to his thumb to secure the grasp on each one. Pt. tends to fatigue at times loosening the grasp distally during the hand to mouth pattern. Pt. continues to be able to more consistently perform right gross digit extension when releasing objects from his hand to discard them onto elevated surfaces. Pt. continues to require hand-over-hand assist  to place the clips into position for the lateral pinch with his right hand.  Pt. continues to be highly motivated to continue to work towards  improving BUE functioning, ROM, strength, motor control, [facility] skills, as well as visual compensatory strategies in order to maximize independence with daily ADLs, and IADL functioning.      PERFORMANCE DEFICITS in functional skills including ADLs, IADLs, coordination, dexterity, proprioception, ROM, strength, pain, FMC, GMC, decreased knowledge of use of DME, and UE functional use, cognitive  skills including safety awareness, and psychosocial skills including coping strategies, environmental adaptation, habits, and routines and behaviors.   IMPAIRMENTS are limiting patient from ADLs, IADLs, leisure, and social participation.   COMORBIDITIES may have co-morbidities  that affects occupational performance. Patient will benefit from skilled OT to address above impairments and improve overall function.  MODIFICATION OR ASSISTANCE TO COMPLETE EVALUATION: Maximum or significant modification of tasks or assist is necessary to complete an evaluation.  OT OCCUPATIONAL PROFILE AND HISTORY: Comprehensive assessment: Review of records and extensive additional review of physical, cognitive, psychosocial history related to current functional performance.  CLINICAL DECISION MAKING: High - multiple treatment options, significant modification of task necessary  REHAB POTENTIAL: Fair    EVALUATION COMPLEXITY: High    PLAN: OT FREQUENCY: 2 x's a week  OT DURATION:12 weeks  PLANNED INTERVENTIONS: self care/ADL training, therapeutic exercise, therapeutic activity, neuromuscular re-education, manual therapy, passive range of motion, patient/family education, and cognitive remediation/compensation RECOMMENDED OTHER SERVICES: PT  CONSULTED AND AGREED WITH PLAN OF CARE: Patient and family member/caregiver  PLAN FOR NEXT SESSION: Initiate treatment  Olegario Messier, MS, OTR/L   02/06/23, 5:24 PM

## 2023-02-07 NOTE — Therapy (Signed)
OUTPATIENT PHYSICAL THERAPY TREATMENT     Patient Name: Paul Hall MRN: [id number] DOB:04-Jan-1996, 27 y.o., male Today's Date: 02/07/2023  PCP:  Cindi Carbon PROVIDER:  Lenise Herald, PA-C   PT End of Session - 02/06/23 0841     Visit Number 133    Number of Visits 146    Date for PT Re-Evaluation 02/22/23    Authorization Type [employer]BS COMM Pro/ [place] Medicaid    Progress Note Due on Visit 140    PT Start Time 1532    PT Stop Time 1614    PT Time Calculation (min) 42 min    Equipment Utilized During Treatment Gait belt    Activity Tolerance Patient tolerated treatment well    Behavior During Therapy WFL for tasks assessed/performed                     Past Medical History:  Diagnosis Date   Diabetes mellitus (HCC)    Hypertension    Stroke (HCC)    Past Surgical History:  Procedure Laterality Date   IVC FILTER PLACEMENT ([facility] HX)     LEG SURGERY     PEG TUBE PLACEMENT     TRACHEOSTOMY     Patient Active Problem List   Diagnosis Date Noted   Sepsis due to vancomycin resistant Enterococcus species (HCC) 06/06/2019   SIRS (systemic inflammatory response syndrome) (HCC) 06/05/2019   Acute lower UTI 06/05/2019   VRE (vancomycin-resistant Enterococci) infection 06/05/2019   Anemia 06/05/2019   Skin ulcer of sacrum with necrosis of muscle (HCC)    Urinary retention    Type 2 diabetes mellitus without complication, with long-term current use of insulin (HCC)    Tachycardia    Lower extremity edema    Acute metabolic encephalopathy    Obstructive sleep apnea    Morbid obesity with BMI of 60.0-69.9, adult (HCC)    Goals of care, counseling/discussion    Palliative care encounter    Sepsis (HCC) 04/27/2019   H/O insulin dependent diabetes mellitus 04/27/2019   History of CVA with residual deficit 04/27/2019   Seizure disorder (HCC) 04/27/2019   Decubitus ulcer of sacral region, stage 4 (HCC) 04/27/2019    REFERRING DIAG: Cerebral infarction,  unspecified   THERAPY DIAG:  Muscle weakness (generalized)  Other lack of coordination  History of CVA with residual deficit  Abnormality of gait and mobility  Difficulty in walking, not elsewhere classified  Unsteadiness on feet  Rationale for Evaluation and Treatment Rehabilitation  PERTINENT HISTORY: Paul Hall is a 26yoM who presents with severe weakness, quadriparesis, altered sensorium, and visual impairment s/p critical illness and prolonged hospitalization. Pt hospitalized in October 2020 with ARDS 2/2 COVID19 infection. Pt sustained a complex and lengthy hospitalization which included tracheostomy, prolonged sedation, ECMO. In this period pt sustained CVA and SDH. Pt has now been liberated from tracheostomy and G-tube. Pt has since been hospitalized for wound infection and UTI. Pt lives with parents at home, has hospital bed and left chair, hoyer lift transfers, and power WC for mobility needs. Pt needs heavy physical assistance with ADL 2/2 BUE contractures and motor dysfunction   PRECAUTIONS: Fall  SUBJECTIVE:  Patient reports doing well with no lingering issues from last visit.    PAIN:  Are you having pain? No   TODAY'S TREATMENT:  - 02/06/2023     *Dependent transfer from w/c to edge of mat using w/c     Therapeutic Activities-  Anterior to posterior weight shift (VC for  technique and tactile cues to lean more anterior) x 20 reps Lateral weight shifting (Sit at EOM) x 20 reps   Tracking soccer ball- rolling ball and patient kicking ball x 15 reps with right then left. Then performed several min of random with good ability to track and kick appropriately without any LOB- only fatigue.   Sit to stand with max assist x 2 (5 trials today) - 3 with PT and SPT and 2 more with PT and his mother.   Static standing at [facility] with forearm support +2 person assist- difficulty clearing bottom or standing erect today- on last trial - able to stand more erect with good  bottom clearance off mat- able to hold 30 + sec on last trial.         PATIENT EDUCATION: Education details: Progression of intensity at home with trunk strength exercises and how to grade intensity using a goniometer bubble app.   HOME EXERCISE PROGRAM:  Access Code: [email or web address] URL: [email or web address] Date: 03/23/2022 Prepared by: Maureen Ralphs  Exercises - Supine Bridge  - 3 x weekly - 3 sets - 10 reps - 2 hold - Supine Gluteal Sets  - 3 x weekly - 3 sets - 10 reps - 5 sec hold - Supine Quad Set  - 1 x daily - 3 x weekly - 3 sets - 10 reps - 5 hold - Seated Long Arc Quad  - 1 x daily - 7 x weekly - 3 sets - 10 reps - Seated Hip Adduction Squeeze with Ball  - 1 x daily - 3 x weekly - 3 sets - 10 reps - 5 hold - Seated Hip Abduction  - 1 x daily - 3 x weekly - 3 sets - 10 reps - 2 hold    PT Short Term Goals -       PT SHORT TERM GOAL #1   Title Pt will be independent with HEP in order to improve strength and balance in order to decrease fall risk and improve function at home and work.    Baseline 01/04/2021= No formal HEP in place; 12/12 no HEP in place; 05/10/2021-Patient and his father were able to report compliance with curent HEP consisting of mostly seated/reclined LE strengthening. Both verbalize no questions at this time.    Time 6    Period Weeks    Status Achieved    Target Date 02/15/21            PT LONG TERM GOAL #1     Title Patient will increase BLE gross strength by 1/2 muscle grade to improve functional strength for improved independence with potential gait, increased standing tolerance and increased ADL ability.     Baseline 01/04/2021- Patient presents with 1/5 to 3-/5 B LE strength with MMT; 12/12: goal partially met for Left knee/hip; 05/10/2021= 2-/5 bilateal Hip flex; 3+/5 bilateral Knee ext; 06/21/2021= Patient presents with 2-/5 bilateral Hip flex; 3+/5 bilateral knee ext/flex; 2-/5 left ankle DF; 0/5 right ankle- and able to increase  reps and resistance with LE's. 09/15/2021- Patient technically presents with 2-/5 B hip flex/abd/add - but he is able to raise his hip up to approx 100 deg which has improved. 3+/5 Bilateral knee ext, 2-/5 left ankle and 0/5 right ankle.  12/08/2021= Patient able to lift left knee at 110 deg of hip flex; presents with 3+/5 knee ext, 2-/5 left ankle DF and 0/5 right ankle DF, 2-/5 bilateral Hip abd in seated position.    12/6: R: knee  3+/5 ext, 2/5 flexion, left knee 3+/5 extension, 3+/5 flexion, R hip: 2+/5 hip add, 2+/5 hip ABD L hip: 4-/5 hip ABD, 3+/5 hip ADD, 3+/5 hip flexion; 06/06/2022= Patient now presents with 2-/5 right ankle DF/PF;     Time 12     Period Weeks     Status MET    Target Date 03/07/2022         PT LONG TERM GOAL #2    Title Patient will tolerate sitting unsupported demonstrating erect sitting posture for 15 minutes with CGA to demonstrate improved back extensor strength and improved sitting tolerance.     Baseline 01/04/2021- Patient confied to sitting in lift chair or electric power chair with back support and unable to sit upright without physical assistance; 12/12: tolerates <1 minutes upright unsupported sitting. 05/10/2021=static sit with forward trunk lean  in his power wheelchair without back support x approx 3 min. 06/21/2021=Unable to assess today due to patient with acute back pain but on previous visit able to sit x 8 min without back support. 09/15/2021- on last visit- 09/13/2021- patient was able to sit unsupported x 8 min at edge of mat. 10/13/2023 - Patient was able to sit at edge of mat with varying level of assist today from SBA to min A for a total of 20 min. 12/13/2021= Patient demonstrated unsupported sitting at edge of mat for approx 20 min    Time 12     Period Weeks     Status GOAL MET    Target Date 12/08/21          PT LONG TERM GOAL #3    Title Patient will demonstrate ability to perform static standing in // bars > 2 min with Max Assist  without loss of balance  and fair posture for improved overall strength for pre-gait and transfer activities.     Baseline 01/04/2021= Patient current uanble to stand- Dependent on hoyer or sit to stand lift for transfers. 05/10/2021=Not appropriate yet- Currently still dependent with all transfers using hoyer. 06/21/2021= Patient continuing now to focus on LE strengthening to prepare for standing-unable to try today due to acute low back pain-  planning on attempting in new cert period. 09/15/2021- Patient has attempted standing 2x in past two week- max Assist of 2 people - only once was he successful to clearing his bottom from chair - Will continue to be a focus during the new certification. 12/13/2021= Patient has been limited secondary to increased overall low back pain during this certification and will require more time to focus on this goal.  12/6: not assessed this date, will assess at date when 2-3 PTs are present for assistance     Time 12     Period Weeks     Status GOAL not appropriate at this time - may attempt in future once Patient presents with improved overall LE strength.                 PT LONG TERM GOAL #4    Title Pt will improve FOTO score by 10 points or more demonstrating improved perceived functional ability     Baseline FOTO 7 on 10/17; 03/15/21: FOTO 12; 05/10/2021 06/21/2021= 1; 09/15/2021= 9; 12/13/2021= Will issue next visit 12/6: 4; 06/06/2022= Will assess next visit; Goal not appropriate as patient is not ambulatory.     Time 12     Period Weeks     Status WITHDRAWN    Target date 11/30/2022  PT LONG TERM GOAL #5    Title Patient will perform sit to stand transfer with appropriate AD and max assist of 2 people with 75% consistency to prepare for pregait activities.     Baseline 09/15/2021= Patient unable to stand well- unable to clear his bottom off chair with Max assist of 2 persons. 12/13/2021- Goal not appropriate to try yet but will keep and roll over to next cert as shift continues to focus on  transfers/standing; 4/24= Patient able to perform active ankle DF/PF with right LE and able to raise his knee into seated march and clear floor without physical assist today - as previously unable as well as lift right knee ext to near full ROM to improve strength for eventual standing. 09/07/2022- Goal continues to not be appropriate but patient is now standing some and will keep goal active; 11/23/2022= patient is now performing sit to stand with max A +2 and now able to clear his bottom off mat. 11/30/2022- Patient continues to perform sit to stand transfers with Max A+2 and able to clear bottom well off mat but not erect yet-     Time 12     Period Weeks     Status PROGRESSING    Target date  02/22/2023       PT LONG TERM GOAL #6  Title Patient will tolerate sitting unsupported demonstrating erect sitting posture for 30 minutes with CGA to demonstrate improved back extensor strength and improved sitting tolerance.   Baseline 12/13/2021= Patient demonstrated unsupported sitting at edge of mat for approx 20 min;  12/29/2021- Patient performed approx 30 min of dynamic sitting activities today. 06/06/2022= Patient demonstrated ability to sit and perform static and dynamic UE/LE movement with only Supervision.   Time 12   Period Weeks   Status GOAL MET           7.  Patient will tolerate 2 minutes or more of standing in sit to stand lift or with max assist +2 in order to indicate improved lower extremity weightbearing tolerance for progression to standing in parallel bars. Baseline: 1 minute on most recent stand 02/21/22; 06/06/2022- Patient did attempt today after complaining of right LE pain last week. He attempted 3 stands using sit to stand lift. - all over 1 min- last one approx 48 sec- stopped due to fatigue. More erect standing in lift today - still poor gluteal strength but able to activate glutes and extend hips upon command but unable to hold > 5 sec. 07/28/2022- Will attempt standing next visit  but patient has been able to stand since last progress note for up to 3 min at a time at his best. 09/07/2022-  attempted standing with max +2 and patient able to stand 15-20 sec so will now  revise goal; 11/23/2022- Patient at his best between last 2 visits was able to stand approx 50 sec with max A+2.  11/30/2022- Patient performed 4 trials of static standing - max A +2- clearing bottom and using forearms to bear weight  Goal status: Revised Target date: 02/22/2023   8. Patient will tolerate sitting unsupported demonstrating ability to perform dynamic UE/LE activities for 30 minutes  independently to demonstrate ability to sit at edge of bed to eat or perform some ADL's/exercise for optimal quality of life.             Baseline: 06/06/2022- Patient able to sit and perform some UE/LE exercises but requires CGA at times for safety- he is able  to static sit for 30 min with supervision. 07/27/2022= Patient able to now sit > 30 min with Supervision only - performing static and dynamic activities. Will keep goal active to ensure patient is consistent. 09/07/2022=  Patient able to demonstrate consistent ability to sit at edge of mat during visits             Goal Status: MET             Target date: 08/29/2022        Plan     Clinical Impression Statement  Patient demonstrated good motivation for session today- able to improve with anterior weight shift today. He was also able to work on his tracking and reaction time with LE with some in session progress - able to kick ball with approx 75%.  He was fatigued with standing but did clear his bottom well on last trial.   Pt will continue to benefit from skilled physical therapy intervention to address impairments, improve QOL, and attain therapy goals.    Personal Factors and Comorbidities Comorbidity 3+;Time since onset of injury/illness/exacerbation    Comorbidities CVA, diabetes, Seizures    Examination-Activity Limitations Bathing;Bed Mobility;Bend;Caring for  Others;Carry;Dressing;Hygiene/Grooming;Lift;Locomotion Level;Reach Overhead;Self Feeding;Sit;Squat;Stairs;Stand;Transfers;Toileting    Examination-Participation Restrictions Cleaning;Community Activity;Driving;Laundry;Medication Management;Meal Prep;Occupation;Personal Finances;Shop;Yard Work;Volunteer    Stability/Clinical Decision Making Evolving/Moderate complexity    Rehab Potential Fair    PT Frequency 2x / week    PT Duration 12 weeks    PT Treatment/Interventions ADLs/Self Care Home Management;Cryotherapy;Electrical Stimulation;Moist Heat;Ultrasound;DME Instruction;Gait training;Stair training;Functional mobility training;Therapeutic exercise;Balance training;Patient/family education;Orthotic Fit/Training;Neuromuscular re-education;Wheelchair mobility training;Manual techniques;Passive range of motion;Dry needling;Energy conservation;Taping;Visual/perceptual remediation/compensation;Joint Manipulations    PT Next Visit Plan core strength/motor control in short-sitting, sit to stand if able; Continue with progressive LE Strengthening. Stand activities as appropriate.     PT Home Exercise Plan No changes to HEP today    Consulted and Agree with Plan of Care Patient;Family member/caregiver    Family Member Consulted            8:43 AM, 02/07/23 Louis Meckel, PT Physical Therapist - [facility]  Outpatient Physical Therapy- [facility] [phone]      02/07/2023, 8:43 AM

## 2023-02-08 ENCOUNTER — Ambulatory Visit: Payer: BC Managed Care – PPO | Admitting: Occupational Therapy

## 2023-02-08 ENCOUNTER — Ambulatory Visit: Payer: Medicare Other

## 2023-02-13 ENCOUNTER — Ambulatory Visit: Payer: Medicare Other

## 2023-02-13 ENCOUNTER — Ambulatory Visit: Payer: BC Managed Care – PPO | Admitting: Occupational Therapy

## 2023-02-13 DIAGNOSIS — R2689 Other abnormalities of gait and mobility: Secondary | ICD-10-CM | POA: Diagnosis not present

## 2023-02-13 DIAGNOSIS — R262 Difficulty in walking, not elsewhere classified: Secondary | ICD-10-CM

## 2023-02-13 DIAGNOSIS — R278 Other lack of coordination: Secondary | ICD-10-CM

## 2023-02-13 DIAGNOSIS — I6930 Unspecified sequelae of cerebral infarction: Secondary | ICD-10-CM | POA: Diagnosis not present

## 2023-02-13 DIAGNOSIS — R269 Unspecified abnormalities of gait and mobility: Secondary | ICD-10-CM

## 2023-02-13 DIAGNOSIS — R2681 Unsteadiness on feet: Secondary | ICD-10-CM | POA: Diagnosis not present

## 2023-02-13 DIAGNOSIS — M6281 Muscle weakness (generalized): Secondary | ICD-10-CM | POA: Diagnosis not present

## 2023-02-13 NOTE — Therapy (Signed)
OUTPATIENT PHYSICAL THERAPY TREATMENT     Patient Name: Paul Hall MRN: [id number] DOB:[birth year], 27 y.o., male Today's Date: 02/13/2023  PCP:  Cindi Carbon PROVIDER:  Lenise Herald, PA-C   PT End of Session - 02/13/23 1611     Visit Number 134    Number of Visits 146    Date for PT Re-Evaluation 02/22/23    Authorization Type [employer] COMM [employer]/ [place] Medicaid    Progress Note Due on Visit 140    PT Start Time 1615    PT Stop Time 1659    PT Time Calculation (min) 44 min    Equipment Utilized During Treatment Gait belt    Activity Tolerance Patient tolerated treatment well    Behavior During Therapy WFL for tasks assessed/performed                     Past Medical History:  Diagnosis Date   Diabetes mellitus (HCC)    Hypertension    Stroke (HCC)    Past Surgical History:  Procedure Laterality Date   IVC FILTER PLACEMENT ([facility] HX)     LEG SURGERY     PEG TUBE PLACEMENT     TRACHEOSTOMY     Patient Active Problem List   Diagnosis Date Noted   Sepsis due to vancomycin resistant Enterococcus species (HCC) 06/06/2019   SIRS (systemic inflammatory response syndrome) (HCC) 06/05/2019   Acute lower UTI 06/05/2019   VRE (vancomycin-resistant Enterococci) infection 06/05/2019   Anemia 06/05/2019   Skin ulcer of sacrum with necrosis of muscle (HCC)    Urinary retention    Type 2 diabetes mellitus without complication, with long-term current use of insulin (HCC)    Tachycardia    Lower extremity edema    Acute metabolic encephalopathy    Obstructive sleep apnea    Morbid obesity with BMI of 60.0-69.9, adult (HCC)    Goals of care, counseling/discussion    Palliative care encounter    Sepsis (HCC) 04/27/2019   H/O insulin dependent diabetes mellitus 04/27/2019   History of CVA with residual deficit 04/27/2019   Seizure disorder (HCC) 04/27/2019   Decubitus ulcer of sacral region, stage 4 (HCC) 04/27/2019    REFERRING DIAG: Cerebral infarction,  unspecified   THERAPY DIAG:  Muscle weakness (generalized)  Other lack of coordination  History of CVA with residual deficit  Abnormality of gait and mobility  Difficulty in walking, not elsewhere classified  Unsteadiness on feet  Rationale for Evaluation and Treatment Rehabilitation  PERTINENT HISTORY: Paul Hall is a 26yoM who presents with severe weakness, quadriparesis, altered sensorium, and visual impairment s/p critical illness and prolonged hospitalization. Pt hospitalized in October 2020 with ARDS 2/2 COVID19 infection. Pt sustained a complex and lengthy hospitalization which included tracheostomy, prolonged sedation, ECMO. In this period pt sustained CVA and SDH. Pt has now been liberated from tracheostomy and G-tube. Pt has since been hospitalized for wound infection and UTI. Pt lives with parents at home, has hospital bed and left chair, hoyer lift transfers, and power WC for mobility needs. Pt needs heavy physical assistance with ADL 2/2 BUE contractures and motor dysfunction   PRECAUTIONS: Fall  SUBJECTIVE:  Patient reports feeling well overall without any issues today.     PAIN:  Are you having pain? No   TODAY'S TREATMENT:  - 02/13/23      *Dependent transfer from w/c to edge of mat using w/c     Therapeutic Exercises: all performed at [facility]  Seated hip march-  alt LE - 2x 12 reps Seated knee ext  2 x 10 reps Seated active ham curl x 12 rep each LE (patient verbalized some discomfort on left LE)  Seated hip abd/add (1 LE at a time) 2 x 10 reps Seated heel raises 2 x 10 reps each LE  Patient reported some dizziness with increased concentration so checked BP= 131/65 mmHg Left UE  He drank a 16.9 oz water with minimal improvement so treatment terminated and patient requested to go back to chair.         PATIENT EDUCATION: Education details: Progression of intensity at home with trunk strength exercises and how to grade intensity using a goniometer  bubble app.   HOME EXERCISE PROGRAM:  Access Code: [email or web address] URL: [email or web address] Date: 03/23/2022 Prepared by: Paul Hall  Exercises - Supine Bridge  - 3 x weekly - 3 sets - 10 reps - 2 hold - Supine Gluteal Sets  - 3 x weekly - 3 sets - 10 reps - 5 sec hold - Supine Quad Set  - 1 x daily - 3 x weekly - 3 sets - 10 reps - 5 hold - Seated Long Arc Quad  - 1 x daily - 7 x weekly - 3 sets - 10 reps - Seated Hip Adduction Squeeze with Ball  - 1 x daily - 3 x weekly - 3 sets - 10 reps - 5 hold - Seated Hip Abduction  - 1 x daily - 3 x weekly - 3 sets - 10 reps - 2 hold    PT Short Term Goals -       PT SHORT TERM GOAL #1   Title Pt will be independent with HEP in order to improve strength and balance in order to decrease fall risk and improve function at home and work.    Baseline 01/04/2021= No formal HEP in place; 12/12 no HEP in place; 05/10/2021-Patient and his father were able to report compliance with curent HEP consisting of mostly seated/reclined LE strengthening. Both verbalize no questions at this time.    Time 6    Period Weeks    Status Achieved    Target Date 02/15/21            PT LONG TERM GOAL #1     Title Patient will increase BLE gross strength by 1/2 muscle grade to improve functional strength for improved independence with potential gait, increased standing tolerance and increased ADL ability.     Baseline 01/04/2021- Patient presents with 1/5 to 3-/5 B LE strength with MMT; 12/12: goal partially met for Left knee/hip; 05/10/2021= 2-/5 bilateal Hip flex; 3+/5 bilateral Knee ext; 06/21/2021= Patient presents with 2-/5 bilateral Hip flex; 3+/5 bilateral knee ext/flex; 2-/5 left ankle DF; 0/5 right ankle- and able to increase reps and resistance with LE's. 09/15/2021- Patient technically presents with 2-/5 B hip flex/abd/add - but he is able to raise his hip up to approx 100 deg which has improved. 3+/5 Bilateral knee ext, 2-/5 left ankle and 0/5  right ankle.  12/08/2021= Patient able to lift left knee at 110 deg of hip flex; presents with 3+/5 knee ext, 2-/5 left ankle DF and 0/5 right ankle DF, 2-/5 bilateral Hip abd in seated position.    12/6: R: knee 3+/5 ext, 2/5 flexion, left knee 3+/5 extension, 3+/5 flexion, R hip: 2+/5 hip add, 2+/5 hip ABD L hip: 4-/5 hip ABD, 3+/5 hip ADD, 3+/5 hip flexion; 06/06/2022= Patient now presents with 2-/5 right ankle DF/PF;  Time 12     Period Weeks     Status MET    Target Date 03/07/2022         PT LONG TERM GOAL #2    Title Patient will tolerate sitting unsupported demonstrating erect sitting posture for 15 minutes with CGA to demonstrate improved back extensor strength and improved sitting tolerance.     Baseline 01/04/2021- Patient confied to sitting in lift chair or electric power chair with back support and unable to sit upright without physical assistance; 12/12: tolerates <1 minutes upright unsupported sitting. 05/10/2021=static sit with forward trunk lean  in his power wheelchair without back support x approx 3 min. 06/21/2021=Unable to assess today due to patient with acute back pain but on previous visit able to sit x 8 min without back support. 09/15/2021- on last visit- 09/13/2021- patient was able to sit unsupported x 8 min at edge of mat. 10/13/2023 - Patient was able to sit at edge of mat with varying level of assist today from SBA to min A for a total of 20 min. 12/13/2021= Patient demonstrated unsupported sitting at edge of mat for approx 20 min    Time 12     Period Weeks     Status GOAL MET    Target Date 12/08/21          PT LONG TERM GOAL #3    Title Patient will demonstrate ability to perform static standing in // bars > 2 min with Max Assist  without loss of balance and fair posture for improved overall strength for pre-gait and transfer activities.     Baseline 01/04/2021= Patient current uanble to stand- Dependent on hoyer or sit to stand lift for transfers. 05/10/2021=Not appropriate  yet- Currently still dependent with all transfers using hoyer. 06/21/2021= Patient continuing now to focus on LE strengthening to prepare for standing-unable to try today due to acute low back pain-  planning on attempting in new cert period. 09/15/2021- Patient has attempted standing 2x in past two week- max Assist of 2 people - only once was he successful to clearing his bottom from chair - Will continue to be a focus during the new certification. 12/13/2021= Patient has been limited secondary to increased overall low back pain during this certification and will require more time to focus on this goal.  12/6: not assessed this date, will assess at date when 2-3 PTs are present for assistance     Time 12     Period Weeks     Status GOAL not appropriate at this time - may attempt in future once Patient presents with improved overall LE strength.                 PT LONG TERM GOAL #4    Title Pt will improve FOTO score by 10 points or more demonstrating improved perceived functional ability     Baseline FOTO 7 on 10/17; 03/15/21: FOTO 12; 05/10/2021 06/21/2021= 1; 09/15/2021= 9; 12/13/2021= Will issue next visit 12/6: 4; 06/06/2022= Will assess next visit; Goal not appropriate as patient is not ambulatory.     Time 12     Period Weeks     Status WITHDRAWN    Target date 11/30/2022         PT LONG TERM GOAL #5    Title Patient will perform sit to stand transfer with appropriate AD and max assist of 2 people with 75% consistency to prepare for pregait activities.     Baseline 09/15/2021=  Patient unable to stand well- unable to clear his bottom off chair with Max assist of 2 persons. 12/13/2021- Goal not appropriate to try yet but will keep and roll over to next cert as shift continues to focus on transfers/standing; 4/24= Patient able to perform active ankle DF/PF with right LE and able to raise his knee into seated march and clear floor without physical assist today - as previously unable as well as lift right knee  ext to near full ROM to improve strength for eventual standing. 09/07/2022- Goal continues to not be appropriate but patient is now standing some and will keep goal active; 11/23/2022= patient is now performing sit to stand with max A +2 and now able to clear his bottom off mat. 11/30/2022- Patient continues to perform sit to stand transfers with Max A+2 and able to clear bottom well off mat but not erect yet-     Time 12     Period Weeks     Status PROGRESSING    Target date  02/22/2023       PT LONG TERM GOAL #6  Title Patient will tolerate sitting unsupported demonstrating erect sitting posture for 30 minutes with CGA to demonstrate improved back extensor strength and improved sitting tolerance.   Baseline 12/13/2021= Patient demonstrated unsupported sitting at edge of mat for approx 20 min;  12/29/2021- Patient performed approx 30 min of dynamic sitting activities today. 06/06/2022= Patient demonstrated ability to sit and perform static and dynamic UE/LE movement with only Supervision.   Time 12   Period Weeks   Status GOAL MET           7.  Patient will tolerate 2 minutes or more of standing in sit to stand lift or with max assist +2 in order to indicate improved lower extremity weightbearing tolerance for progression to standing in parallel bars. Baseline: 1 minute on most recent stand 02/21/22; 06/06/2022- Patient did attempt today after complaining of right LE pain last week. He attempted 3 stands using sit to stand lift. - all over 1 min- last one approx 48 sec- stopped due to fatigue. More erect standing in lift today - still poor gluteal strength but able to activate glutes and extend hips upon command but unable to hold > 5 sec. 07/28/2022- Will attempt standing next visit but patient has been able to stand since last progress note for up to 3 min at a time at his best. 09/07/2022-  attempted standing with max +2 and patient able to stand 15-20 sec so will now  revise goal; 11/23/2022- Patient at  his best between last 2 visits was able to stand approx 50 sec with max A+2.  11/30/2022- Patient performed 4 trials of static standing - max A +2- clearing bottom and using forearms to bear weight  Goal status: Revised Target date: 02/22/2023   8. Patient will tolerate sitting unsupported demonstrating ability to perform dynamic UE/LE activities for 30 minutes  independently to demonstrate ability to sit at edge of bed to eat or perform some ADL's/exercise for optimal quality of life.             Baseline: 06/06/2022- Patient able to sit and perform some UE/LE exercises but requires CGA at times for safety- he is able to static sit for 30 min with supervision. 07/27/2022= Patient able to now sit > 30 min with Supervision only - performing static and dynamic activities. Will keep goal active to ensure patient is consistent. 09/07/2022=  Patient able  to demonstrate consistent ability to sit at edge of mat during visits             Goal Status: MET             Target date: 08/29/2022        Plan     Clinical Impression Statement  Patient performed well overall- as he continues to perform more active movement with right LE. He concentrates so hard at times that he becomes dizzy and normally able to return to baseline but today remained dizzy at end of visit so did not try to stand.  Pt will continue to benefit from skilled physical therapy intervention to address impairments, improve QOL, and attain therapy goals.    Personal Factors and Comorbidities Comorbidity 3+;Time since onset of injury/illness/exacerbation    Comorbidities CVA, diabetes, Seizures    Examination-Activity Limitations Bathing;Bed Mobility;Bend;Caring for Others;Carry;Dressing;Hygiene/Grooming;Lift;Locomotion Level;Reach Overhead;Self Feeding;Sit;Squat;Stairs;Stand;Transfers;Toileting    Examination-Participation Restrictions Cleaning;Community Activity;Driving;Laundry;Medication Management;Meal Prep;Occupation;Personal  Finances;Shop;Yard Work;Volunteer    Stability/Clinical Decision Making Evolving/Moderate complexity    Rehab Potential Fair    PT Frequency 2x / week    PT Duration 12 weeks    PT Treatment/Interventions ADLs/Self Care Home Management;Cryotherapy;Electrical Stimulation;Moist Heat;Ultrasound;DME Instruction;Gait training;Stair training;Functional mobility training;Therapeutic exercise;Balance training;Patient/family education;Orthotic Fit/Training;Neuromuscular re-education;Wheelchair mobility training;Manual techniques;Passive range of motion;Dry needling;Energy conservation;Taping;Visual/perceptual remediation/compensation;Joint Manipulations    PT Next Visit Plan core strength/motor control in short-sitting, sit to stand if able; Continue with progressive LE Strengthening. Stand activities as appropriate.     PT Home Exercise Plan No changes to HEP today    Consulted and Agree with Plan of Care Patient;Family member/caregiver    Family Member Consulted            5:25 PM, 02/13/23 Louis Meckel, PT Physical Therapist - [facility]  Outpatient Physical Therapy- [facility] [phone]      02/13/2023, 5:25 PM

## 2023-02-13 NOTE — Therapy (Signed)
Occupational Therapy Neuro Treatment/Recertification Note  Patient Name: Paul Hall MRN: [id number] DOB:[birth year], 27 y.o., male Today's Date: 02/13/2023  PCP: Dr. Sherwood Gambler REFERRING PROVIDER: Dr. Sherwood Gambler   OT End of Session - 02/13/23 1649     Visit Number [id number]    Number of Visits 84    Date for OT Re-Evaluation 05/08/23    OT Start Time 1530    OT Stop Time 1615    OT Time Calculation (min) 45 min    Activity Tolerance Patient tolerated treatment well    Behavior During Therapy WFL for tasks assessed/performed                  Past Medical History:  Diagnosis Date   Diabetes mellitus (HCC)    Hypertension    Stroke [facility])    Past Surgical History:  Procedure Laterality Date   IVC FILTER PLACEMENT ([facility] HX)     LEG SURGERY     PEG TUBE PLACEMENT     TRACHEOSTOMY     Patient Active Problem List   Diagnosis Date Noted   Sepsis due to vancomycin resistant Enterococcus species (HCC) 06/06/2019   SIRS (systemic inflammatory response syndrome) (HCC) 06/05/2019   Acute lower UTI 06/05/2019   VRE (vancomycin-resistant Enterococci) infection 06/05/2019   Anemia 06/05/2019   Skin ulcer of sacrum with necrosis of muscle (HCC)    Urinary retention    Type 2 diabetes mellitus without complication, with long-term current use of insulin (HCC)    Tachycardia    Lower extremity edema    Acute metabolic encephalopathy    Obstructive sleep apnea    Morbid obesity with BMI of 60.0-69.9, adult (HCC)    Goals of care, counseling/discussion    Palliative care encounter    Sepsis (HCC) 04/27/2019   H/O insulin dependent diabetes mellitus 04/27/2019   History of CVA with residual deficit 04/27/2019   Seizure disorder (HCC) 04/27/2019   Decubitus ulcer of sacral region, stage 4 (HCC) 04/27/2019   ONSET DATE: 01/2019  REFERRING DIAG: CVA/COVID-19  THERAPY DIAG:  Muscle weakness (generalized)  Rationale for Evaluation and Treatment Rehabilitation  SUBJECTIVE:    SUBJECTIVE STATEMENT:  Pt. reports doing well today.   Pt accompanied by: self and family member  PERTINENT HISTORY:  Pt. is a 27 y.o. male who was diagnosed with COVID-19, and CVA with resultant quadriplegia. Pt. Was then hospitalized with VRE UTI. PMHx includes: urinary retention, seizure disorder, obstructive sleep disorder, DM Type II, Morbid obesity.   PRECAUTIONS: None  WEIGHT BEARING RESTRICTIONS No  PAIN:  Are you having pain? No reports of pain 2/10  FALLS: Has patient fallen in last 6 months? No  LIVING ENVIRONMENT: Lives with: lives with their family Lives in: House/apartment Stairs: No Level Entry Has following equipment at home: Wheelchair (power) and hoyer lift, sit to stand lift  PLOF: Independent  PATIENT GOALS: To be able to engage in more daily care tasks.  OBJECTIVE:   HAND DOMINANCE: Right  ADLs:  Transfers/ambulation related to ADLs: Eating: Pt. reports being able to hold standard utensils, and is starting to engage more in self-feeding tasks, hand to mouth patterns. Pt. Reports that he does as much as he can with the task, and family assists  with the remainder of the task. Grooming: Pt. Is able to initiate holding an electric toothbrush, and brush his teeth. Family assists LB Dressing: Total Assist UE dressing: Pt. is now able to reach up to actively assist with grasping , and pulling  his gown down. Toileting:  Total Assist Bathing: MaxA UB, Total assist LB Tub Shower transfers: N/A Equipment: See above    IADLs: Shopping: Relies on family to assist Light housekeeping: Total Assist Meal Prep: Total Assist Community mobility:   Medication management:  Total Assist  Financial management: N/A Handwriting: Not legible: Pt. Is able to hold a pen with the left hand, and initiate marking the page. Pt.'s eye glasses were not available  MOBILITY STATUS:  Power w/c  POSTURE COMMENTS:  Pt. Requires position changes in his power w/c  ACTIVITY  TOLERANCE: Activity tolerance:  Fair  FUNCTIONAL OUTCOME MEASURES: FOTO: 40  TR score: 45  05/25/2022:   FOTO: 50 TR score: 45  07/20/22: FOTO 55  UPPER EXTREMITY ROM     Active ROM Left eval Left  03/21/2022 Left 05/25/2022 L  07/20/22 Left 07/25/2022 Left  08/15/2022 Left  09/26/2022 Left  11/16/2022 Left 12/28/2022  Shoulder flexion 119 123 125 130  136 138 130 142  Shoulder abduction 110 115 115 115  118 121 125 142  Shoulder adduction           Shoulder extension           Shoulder internal rotation           Shoulder external rotation           Elbow flexion 135(135) 145 145 145  145     Elbow extension -27(-18) -21(-20) -20(-16)  -25(-10) -23(-10) -21(-10) -20(-18) -20(-18)  Wrist flexion           Wrist extension 10(50) 19(50) 28(50)  30(50) 35(55) 36(55) 36(60) 40(60)  Wrist ulnar deviation           Wrist radial deviation           Wrist pronation           Wrist supination   Limited by flexor tone        (Blank rows = not tested)    Active ROM Right eval Right 03/21/2022 Right 05/25/2022 R  07/20/22 Right  07/25/22 Right 08/15/2022 Right 09/26/2022 Right 11/16/2022 Right 12/28/2022  Shoulder flexion 106 scaption 105  Scaption 62 flexion 110 scaption 100  105 105 105 112 Scaption  Shoulder abduction 114 90 97 100  105 117 120 124  Shoulder adduction           Shoulder extension           Shoulder internal rotation           Shoulder external rotation           Elbow flexion 120(130) 145 145 140  144 140 142 148  Elbow extension -45(-35) -22(-35) -28(-22)  -32(-21) -32(-28) -28(-26) -28(-28) -27(-26)  Wrist flexion           Wrist extension -30(10) -25(10) -10(30)  -10(20) -10(25) -20(40) -30(10) -22(34)  Wrist ulnar deviation           Wrist radial deviation           Wrist pronation           Wrist supination   Limited by flexor tone          12/28/2022: Thumb radial abduction: Right: 12(46), Left: 40(54)  Digits: Bilateral 2nd through 5th digit: MP  Hyperextension with PIP, an DIP flexion     UPPER EXTREMITY MMT:     Left eval Left 03/21/2022 Left  05/25/2022 L 07/20/22 Left  08/15/2022 Left 09/26/2022 Left 11/16/2022 Left  12/28/2022  Shoulder flexion 3-/5 3/5 3+/5 4+/5 4+/5 4+/5 4+/5 4+/5  Shoulder abduction 3-/5 3-/5 4-/5 4+/5 4+/5 4+5 4+/5 4+/5  Shoulder adduction          Shoulder extension          Shoulder internal rotation          Shoulder external rotation          Elbow flexion 3/5 3+/5 4-/5 4+/5 5/5 5/5 5/5 5/5  Elbow extension 2-/5 2/5  4+/5 4+/5 4+/5  4/5  Wrist flexion          Wrist extension 2/5 2+/5 3-/5  3-/5 3-/5 3-/5 3/5  Wrist ulnar deviation          Wrist radial deviation          Wrist pronation          Wrist supination          (Blank rows = not tested)  Right eval Right 03/21/2022 Right  05/25/2022 R 07/20/22 Right 08/15/2022 Right 09/26/2022 Right 11/16/2022 Right 12/28/2022  Shoulder flexion 3-/5 scaption 3-/5 3-/5 3+/5 3+/5 3+/5 3/5 3/5  Shoulder abduction 3-/5 3-/5 3-/5 4/5 4/5 4/5 4/5 4/5  Shoulder adduction          Shoulder extension          Shoulder internal rotation          Shoulder external rotation          Elbow flexion 3/5 3/5 3+/5 4/5 4+/5 4+/5 4+/5 4+/5  Elbow extension 2-/5 2/5 3-/5 4+/5 4+/5 4+/5 4+/5 4-/5  Wrist flexion          Wrist extension 2-/5 2-/5 2/5 2/5 2/5 2/5 2/5 2/5  Wrist ulnar deviation          Wrist radial deviation          Wrist pronation          Wrist supination            HAND FUNCTION:  Grip strength: Right: 0 lbs; Left: 0 lbs and Lateral pinch: Right: 5 lbs, Left: 2 lbs  03/21/2022: Lateral pinch: Right: 3.5 lbs, Left: 2 lbs  07/20/22: Grip strength: Right: 3 lbs; Left: 4 lbs and Lateral pinch: Right: 5 lbs, Left: 3 lbs  08/15/2022:  Grip strength: Right: 0 lbs; Left: 5 lbs and Lateral pinch: Right: 2 lbs, Left: 4 lbs  09/26/2022  Grip strength: Right: 0 lbs; Left: 8 lbs and Lateral pinch: Right: 2 lbs, Left: 5 lbs  11/16/2022  Grip  strength: Right: 0 lbs; Left: 8 lbs  9/25/20204  Grip strength: Right: 0 lbs; Left: 8 lbs    Bilateral digit PIP/DIP flexion contractures with MP hyperextension with attempts for AROM. Pt. is able to tolerate AROM to the bilateral digits at the initial evaluation however, has a history of pain in the digits.  COORDINATION: Eval: Pt. is unable to grasp 9-hole test pegs. Pt. is able to initiate grasping larger pegs, and is able to hold a pen in the left hand.  07/20/22: 2 min 36 seconds to remove 9 pegs from 9 hole peg test - cues to locate pegs 2/2 low vision. Pt. is able to initiate grasping larger pegs on R hand and is able to hold a pen in the left hand.  08/15/2022:      Vision, and sensation limiting accuracy of 9 hole peg test results. Pt. Was able to grasp and remove vertical pegs intermittently with cues.  SENSATION: Light touch: Impaired   EDEMA:  N/A  MUSCLE TONE: BUE flexor Spasticity  COGNITION Overall cognitive status: Continue to assess in functional context  VISION:   Subjective report: Pt. was not wearing glasses at the time of the initial eval.  Baseline vision: Vision is very limited. Wears glasses all the time Visual history: History of impaired vision following CVA. Pt. Has received treatment through the [facility] low vision rehabilitation program.   VISION ASSESSMENT: Impaired To be further assessed in functional context  PERCEPTION: Impaired   PRAXIS: Impaired: motor planning  OBSERVATIONS:  Pt reports being on Tramadol   TODAY'S TREATMENT     Therapeutic Ex.:  Pt. performed AROM for right shoulder flexion, abduction. Emphasis was placed on slow prolonged gentle passive stretching for RUE, forearm supination, bilateral wrist extension, wrist extension, digit MP, PIP, and DIP extension following moist heat. ROM was performed to decrease stiffness, and prepare the RUE for functional use. ROM was performed following moist heat to the shoulder right  elbow, forearm, wrist, and digits. PROM for bilateral digit MP, PIP, and DIP extension in preparation for placing them into a flat position for weightbearing using a foam block. Pt. worked on right hand lateral pinch strengthening followed by reps of reaching out with right elbow extension, and extending his digits to release them onto an elevated surface. Pt. was able to complete more reps with the red, and green resistive clips.    PATIENT EDUCATION: Education details: Doctor, general practice and fork positioning for self feeding Person educated: Patient and Parent Education method: Explanation, Demonstration, Tactile cues, and Verbal cues Education comprehension: verbalized understanding, returned demonstration, verbal cues required, and needs further education  HOME EXERCISE PROGRAM:  Continue ongoing assessment, and continue to provide as needed.   GOALS: Goals reviewed with patient? Yes  SHORT TERM GOALS: Target date: 11/07/2022   To assess splint fit, and make appropriate adjustments to promote good skin integrity through the palmar surface of the bilateral hands.  Baseline: 05/25/22: Goal currently met, however ongoing as needs to assess splint fit arise. 03/23/2022: Pt. is wearing splints a couple of hours at night bilateral resting hand splints. 03/21/2022: Pt. is wearing splints a couple of hours at night bilateral resting hand splints. Goal status: Deferred   LONG TERM GOALS: Target date: 05/08/2023    FOTO score will Improve by 2 points for Pt. perceived improvement with the assessment specific ADL/IADL tasks.  Baseline:  12/28/2022: FOTO score: 56; 11/16/2022: 52 09/27/2022: 51 07/20/2022: FOTO 55 05/25/2022: FOTO score: 50,  TR score: 45 Eval: FOTO score: 40,  TR score: 45 Goal status: Achieved   2.   Pt. will independently perform oral care for 100% of the task after complete set-up. Baseline: 02/13/23: Pt. Continues to require assist proximally 01/09/2023: Continue 12/28/2022:  Pt.  Continues to complete 90% of the task with complete set-up, with visual cues, and occassional assist of the right elbow. 11/16/2022: Pt. Completes 90% of the task with complete set-up. 09/26/2022: Completes 90% of the task, proximal assist at times required at the elbow.  08/15/2022: Completes 90% of the task, proximal assist at times required at the elbow. 07/20/22: completes 90% of task, limited by shoulder flexion. 05/25/2022:  Pt. Is able to initiate and perform oral care for approximately 90% of the task. Complete set-up required. Assi needed only for the very back teeth. 03/23/2022: Pt. Is able to initiate and perform oral care for approximately 75% of the task. Pt. Requires assist at proximally at  the elbow for through oral care. 03/21/2022: Pt. Is able to initiate and perform oral care for approximately 75% of the task. Pt. Requires assist at proximally at the elbow for through oral care. Eval: Pt. is able to initiate using an electric toothbrush. Pt. requires assist for set-up, and assist for thoroughness, and as he Pt. fatigues. Goal status: Ongoing  3.  Pt. Will be modified independence with self-feeding for 100% using a swivel spoon, and standard fork after complete set-up Baseline: 02/13/23:  Pt. Is now able to consistently, and efficiently use a swivel spoon for eating cereal with the left hand.01/09/2023: Continue 12/28/2022: Pt. Is now feeding himself 90% of the time using a swivel spoon with the left hand, and 95% of the time with the fork. Pt. Continues to have difficulty with cutting food. 11/16/2022: 100% with finger foods, Pt. Is initiating with a swivel spoon, and universal cuff for fork use, however requires assist for consistency, and accuracy.  09/26/2022: 90% using the spoon, assist at time with the forearm motion, and grip on the utensils. 100% for finger foods.  08/15/2022:  90% using the spoon, assit at time with the forearm motion, and grip on the utensils. 100% for finger foods-independent  with set-up- unsupervised.  07/20/22: self-feeds cereal using spoon 90% of task. 05/25/2022: Pt. Is able to use a spoon to scoop cereal when feeding himself cereal 85% of the time. Pt. Is able to feed himself snack/finger foods 100% of the time. Pt. Continues to work on consistency of  stabilizing a cup/mug when drinking. Pt. Is able to grasp a water bottle with assist initially, with assist tapering off as he drinks.03/23/2022: Pt. Is able to perform scooping cereal for 75% of the time. Pt. required assist, and support at the left elbow, and Pt. Presents with limited forearm supination when using the spoon, and bringing it towards his mouth. Pt. Is able to use a fork to spear items, and perform the hand to mouth pattern.  03/21/2022: Pt. Is able to perform scooping cereal for 75% of the time. Pt. required assist, and support at the left elbow, and Pt. presents with limited forearm supination when using the spoon, and bringing it towards his mouth. Pt. Is able to use a fork to spear items, and perform the hand to mouth pattern.  Eval: Pt. is able to hold standard standard utensils. Pt. Performs as much of the task as he, can and has assistance for the remainder. Goal status:   Ongoing  4.  Pt. Will improve grasp patterns and consistently grasp 1/4" objects for ADL, and IADL tasks.  Baseline: 02/13/2023: 1/2" objects efficiently 01/09/2023: Continue 12/28/2022: Continues to grasp 1/4" pegs with min a + visual cues, consistently grasping 1/2" objects with visual cues 11/16/2022: Continues to grasp 1/4" pegs with min a + visual cues, consistently grasping 1/2" objects with visual cues 09/26/2022: Continues to grasp 1/4" pegs with min a + visual cues, consistently grasping 1/2" objects with visual cues. 08/15/2022: grasps 1/4" pegs with min a + visual cues, consistently grasping 1/2" objects with visual cues. 07/20/22: grasps 1/4" pegs with min a + visual cues, consistently grasping 1/2" objects with visual cues.  05/25/2022: Pt. Is working on improving consistency of grasping 1/2" objects with visual cues.  03/23/2022: Pt. Is able to grasp 1" objects consistently,and continues to work on the hand patterns needed to grasp 1/2" objects.03/21/2022: Pt. Is able to grasp 1" objects consistently,and continues to work on the hand patterns needed to grasp  1/2" objects. Eval: Pt. is able to grasp 1" objects intermittently using a lateral grasp pattern. Goal status: Ongoing  5.  Pt. will independently write his name legibly with letter sizes under 1". Baseline: 02/13/2023: Continue 01/09/2023: Continue. 12/28/2022: Pt. is now using an IPad Pen. 11/16/2022: Pt. Continues to be able to write name with smaller letter size. Pt. Is signing name with a computer styus. Pt. Requires visual cues, and assist 09/26/2022: Pt. Continues to be able to write name with smaller letter size. Pt. Is signing name with a computer styus. Pt. Requires visual cues, and assist. 08/15/2022: Pt. Is able to write name with smaller letter size. Pt. Is signing name with a computer styus. Pt. Requires visual cues, and assist. 07/20/22: stabilizing assist to write name with 75% legibility with 2" letters. 05/25/2022: Pt. to continue to work towards formulating grasp patterns in preparation for grasping a large width pen. Pt. Requires visual cues. 03/23/2022: Pt. Is able to write his name with modA, however has difficulty with formulating letter sizes less than 2" in size with 50% legibility for the 3 letters of his name.03/21/2022: Pt. Is able to write his name with modA, however has difficulty with formulating letter sizes less than 2" in size with 50% legibility for the 3 letters of his name. Eval: Pt is able to hold a thin marker with his left hand, and formulate a line, and initiate a circular pattern (Pt. without glasses today) Goal status: Ongoing  6. Pt. Will reach up to comb/brush his hair with minA.  Baseline: 02/13/2023:Pt. is able to use a hair pick with  the left hand on the right side of his head, top, and back of the head. Pt. Continues to have difficulty reaching further to the right side of his head with the left. 01/09/2023: Continue 12/28/2022: Pt. Is progressing, and is now able to use a hair pick with the left hand on the right side of his head, top, and back of the head. Pt. Continues to have difficulty reaching further to the right side of his head with the left. 11/16/2022: Pt. Is able to use a hair pick for the left side of his head using his left hand. Pt. Presents with difficulty reaching to the right side of his head.09/26/2022: Pt. Is able to use a hair pick for the left side of his head using his left hand. Pt. Presents with difficulty reaching to the right side of his head. 5/13/202: 75% using a hair pick, assist to the right side of the head. 07/20/22: reaches 75% of head, assist for far R side of head, fatigues quickly. 05/25/2022: Pt. Is able to reach up with the left hand to the left side, top, and back of his head. 03/23/2022: Pt. is now able to more consistently initiate reaching up to his head with his left hand in preparation for haircare12/18/2023: Pt. is now able to more consistently initiate reaching up to his head with his left hand in preparation for haircare. Eval: Pt. is able to initiate reaching up for hair care with a long handled brush, however is unable to sustain UE's in elevation to perform the task.     Goal status: Ongoing  7. Pt. Will independently navigate the w/c through his environment with minA with visual scanning, and hand placement on the controls.  Baseline: 11/16/2022: Deferred 2/2 vision 09/26/2022: Pt. Is now navigating his w/c ouside the home, and around his block. 08/15/2022: Pt. Requires minA to setup hand on controls and  MIN cues to navigate the w/c in wide spaces, requires MIN - MOD cues to navigate the w/c through more narrow doorways, and tighter turns - varies based on good vs bad vision days.07/20/22: Pt.  Requires minA to setup hand on controls and MIN cues to navigate the w/c in wide spaces, requires MIN - MOD cues to navigate the w/c through more narrow doorways, and tighter turns - varies based on good vs bad vision days. 05/25/2022: Pt. Requires minA  and  cues to navigate the w/c in wide spaces, and requires Mod cues to navigate the w/c through more narrow doorways, and tighter turns. Pt. Requires max cues for scanning through the environment, and moderate cues for hand placement on the controls.     Goal status: Deferred 2/2 vision    8. Pt. Will improve bilateral grip strength to be able to independently grasp, and pull up blankets, and linens.   Baseline: 02/13/2023: Continue. Pt. presents with digit PIP, and DIP flexor tightness. 01/09/2023: Continue 12/28/2023: R: 0#, L: 5# Pt. Is now able to more consistently grasp and pull blankets up over him. 11/16/2022: R: 0 L: 8# Pt. Is more consistently grasping his blankets and attempting to pull them up. 09/26/2022: R: 0  L: 8# Pt. is attempting to grasp, and pull blankets up more, using mostly the left hand. 08/15/2022: Pt. has difficulty securely holding, and pulling up blankets, and linens.    Goal status: Ongoing  9. Pt. will consistently actively control the releasing  of blankets, covers, and linens from his hands once they are in the desired position over him. Baseline:12/28/2022: Pt. Is consistently actively able to release linens once they are in the desired position over him. 11/16/2022: Pt. continues to improve with actively releasing blankets form his hands. 09/26/2022: Pt. is improving with actively releasing blankets form his hands. 08/15/2022: Pt. has difficulty with controlled releasing of blankets/linens from his grasp.     Goal status: Achieved    10. Pt. Will improve bilateral UE strength by 2 MM grades to assist with ADLs, and IADLs  Baseline: 02/13/2023: Continue 01/09/2023: Continue 12/28/2022: Right: shoulder flexion: 3/5, abduction: 4/5,  elbow flexion: 4+/5 , extension: 4-/5 wrist extension: 2/5; Left: shoulder flexion: 4+/5, abduction: 4+/5, elbow flexion: 5/5 , extension: 4/5  wrist extension: 3/5 Right: shoulder flexion: 3+/5, abduction: 4/5, elbow flexion: 4+/5 , extension: 4+/5 wrist extension: 2/5; Left: shoulder flexion: 4+/5, abduction: 4+/5, elbow flexion: 5/5 , extension: 4+/5 wrist extension: 3-/5 11/16/2022: Right: shoulder flexion: 3/5, abduction: 4/5, elbow flexion: 4+/5 , extension: 4+/5 wrist extension: 2/5; Left: shoulder flexion: 4+/5, abduction: 4+/5, elbow flexion: 5/5 , extension:  wrist extension: 3-/5 Right: shoulder flexion: 3+/5, abduction: 4/5, elbow flexion: 4+/5 , extension: 4+/5 wrist extension: 2/5; Left: shoulder flexion: 4+/5, abduction: 4+/5, elbow flexion: 5/5 , extension: 4+/5 wrist extension: 3-/5 Goal status: Ongoing  11. Pt. Will improve right wrist extension by 10 degrees to initiate reaching for items in preparation for ADLs. Baseline: 02/13/2023: Continue 01/09/2023: Continue 12/28/2022: -22(34) 11/16/2022: -30(10)    Goal status: Progressing   ASSESSMENT:  CLINICAL IMPRESSION:  Pt. is independently using a swivel spoon to perform self-feeding for 100% of his cereal. Pt. continues to use his RUE more during self feeding skills, including grasping flat crackers and bringing them to his mouth. Pt. is improving with consistency with grasping, and performing a combination of movements when performing right gross digit extension in releasing objects from his hand to discard them onto an elevated surfaces. Pt.  Is actively able to perform MP extension to release objects, however Pt. Continues to present with digit PIP, and DIP flexor tightness. Pt. continues to be highly motivated to continue working towards improving BUE functioning, ROM, strength, motor control, [facility] skills, as well as visual compensatory strategies in order to maximize independence with daily ADLs, and IADL functioning.       PERFORMANCE DEFICITS in functional skills including ADLs, IADLs, coordination, dexterity, proprioception, ROM, strength, pain, FMC, GMC, decreased knowledge of use of DME, and UE functional use, cognitive skills including safety awareness, and psychosocial skills including coping strategies, environmental adaptation, habits, and routines and behaviors.   IMPAIRMENTS are limiting patient from ADLs, IADLs, leisure, and social participation.   COMORBIDITIES may have co-morbidities  that affects occupational performance. Patient will benefit from skilled OT to address above impairments and improve overall function.  MODIFICATION OR ASSISTANCE TO COMPLETE EVALUATION: Maximum or significant modification of tasks or assist is necessary to complete an evaluation.  OT OCCUPATIONAL PROFILE AND HISTORY: Comprehensive assessment: Review of records and extensive additional review of physical, cognitive, psychosocial history related to current functional performance.  CLINICAL DECISION MAKING: High - multiple treatment options, significant modification of task necessary  REHAB POTENTIAL: Fair    EVALUATION COMPLEXITY: High    PLAN: OT FREQUENCY: 2 x's a week  OT DURATION:12 weeks  PLANNED INTERVENTIONS: self care/ADL training, therapeutic exercise, therapeutic activity, neuromuscular re-education, manual therapy, passive range of motion, patient/family education, and cognitive remediation/compensation RECOMMENDED OTHER SERVICES: PT  CONSULTED AND AGREED WITH PLAN OF CARE: Patient and family member/caregiver  PLAN FOR NEXT SESSION: Initiate treatment  Olegario Messier, MS, OTR/L   02/13/23, 4:53 PM

## 2023-02-15 ENCOUNTER — Encounter: Payer: Self-pay | Admitting: Occupational Therapy

## 2023-02-15 ENCOUNTER — Ambulatory Visit: Payer: Medicare Other

## 2023-02-15 ENCOUNTER — Ambulatory Visit: Payer: Medicare Other | Admitting: Occupational Therapy

## 2023-02-15 DIAGNOSIS — R262 Difficulty in walking, not elsewhere classified: Secondary | ICD-10-CM | POA: Diagnosis not present

## 2023-02-15 DIAGNOSIS — I6930 Unspecified sequelae of cerebral infarction: Secondary | ICD-10-CM

## 2023-02-15 DIAGNOSIS — R2681 Unsteadiness on feet: Secondary | ICD-10-CM

## 2023-02-15 DIAGNOSIS — R278 Other lack of coordination: Secondary | ICD-10-CM | POA: Diagnosis not present

## 2023-02-15 DIAGNOSIS — R2689 Other abnormalities of gait and mobility: Secondary | ICD-10-CM | POA: Diagnosis not present

## 2023-02-15 DIAGNOSIS — M6281 Muscle weakness (generalized): Secondary | ICD-10-CM | POA: Diagnosis not present

## 2023-02-15 DIAGNOSIS — R269 Unspecified abnormalities of gait and mobility: Secondary | ICD-10-CM | POA: Diagnosis not present

## 2023-02-15 NOTE — Therapy (Signed)
Occupational Therapy Neuro Treatment Note  Patient Name: Paul Hall MRN: [id number] DOB:27-Jul-1995, 27 y.o., male Today's Date: 02/15/2023  PCP: Dr. Sherwood Gambler REFERRING PROVIDER: Dr. Sherwood Gambler   OT End of Session - 02/15/23 1705     Visit Number [id number]    Number of Visits 84    Date for OT Re-Evaluation 05/08/23    OT Start Time 1535    OT Stop Time 1615    OT Time Calculation (min) 40 min    Equipment Utilized During Treatment tilt in space power wc    Activity Tolerance Patient tolerated treatment well    Behavior During Therapy WFL for tasks assessed/performed                  Past Medical History:  Diagnosis Date   Diabetes mellitus (HCC)    Hypertension    Stroke [facility])    Past Surgical History:  Procedure Laterality Date   IVC FILTER PLACEMENT ([facility] HX)     LEG SURGERY     PEG TUBE PLACEMENT     TRACHEOSTOMY     Patient Active Problem List   Diagnosis Date Noted   Sepsis due to vancomycin resistant Enterococcus species (HCC) 06/06/2019   SIRS (systemic inflammatory response syndrome) (HCC) 06/05/2019   Acute lower UTI 06/05/2019   VRE (vancomycin-resistant Enterococci) infection 06/05/2019   Anemia 06/05/2019   Skin ulcer of sacrum with necrosis of muscle (HCC)    Urinary retention    Type 2 diabetes mellitus without complication, with long-term current use of insulin (HCC)    Tachycardia    Lower extremity edema    Acute metabolic encephalopathy    Obstructive sleep apnea    Morbid obesity with BMI of 60.0-69.9, adult (HCC)    Goals of care, counseling/discussion    Palliative care encounter    Sepsis (HCC) 04/27/2019   H/O insulin dependent diabetes mellitus 04/27/2019   History of CVA with residual deficit 04/27/2019   Seizure disorder (HCC) 04/27/2019   Decubitus ulcer of sacral region, stage 4 (HCC) 04/27/2019   ONSET DATE: 01/2019  REFERRING DIAG: CVA/COVID-19  THERAPY DIAG:  Muscle weakness (generalized)  Rationale for Evaluation  and Treatment Rehabilitation  SUBJECTIVE:   SUBJECTIVE STATEMENT:  Pt. reports that they are going to [place], [place] for a wrestling event this weekend.  Pt accompanied by: self and family member  PERTINENT HISTORY:  Pt. is a 27 y.o. male who was diagnosed with COVID-19, and CVA with resultant quadriplegia. Pt. Was then hospitalized with VRE UTI. PMHx includes: urinary retention, seizure disorder, obstructive sleep disorder, DM Type II, Morbid obesity.   PRECAUTIONS: None  WEIGHT BEARING RESTRICTIONS No  PAIN:  Are you having pain? Reports right  wrist tightness.  FALLS: Has patient fallen in last 6 months? No  LIVING ENVIRONMENT: Lives with: lives with their family Lives in: House/apartment Stairs: No Level Entry Has following equipment at home: Wheelchair (power) and hoyer lift, sit to stand lift  PLOF: Independent  PATIENT GOALS: To be able to engage in more daily care tasks.  OBJECTIVE:   HAND DOMINANCE: Right  ADLs:  Transfers/ambulation related to ADLs: Eating: Pt. reports being able to hold standard utensils, and is starting to engage more in self-feeding tasks, hand to mouth patterns. Pt. Reports that he does as much as he can with the task, and family assists  with the remainder of the task. Grooming: Pt. Is able to initiate holding an electric toothbrush, and brush his teeth. Family assists  LB Dressing: Total Assist UE dressing: Pt. is now able to reach up to actively assist with grasping , and pulling his gown down. Toileting:  Total Assist Bathing: MaxA UB, Total assist LB Tub Shower transfers: N/A Equipment: See above    IADLs: Shopping: Relies on family to assist Light housekeeping: Total Assist Meal Prep: Total Assist Community mobility:   Medication management:  Total Assist  Financial management: N/A Handwriting: Not legible: Pt. Is able to hold a pen with the left hand, and initiate marking the page. Pt.'s eye glasses were not available  MOBILITY  STATUS:  Power w/c  POSTURE COMMENTS:  Pt. Requires position changes in his power w/c  ACTIVITY TOLERANCE: Activity tolerance:  Fair  FUNCTIONAL OUTCOME MEASURES: FOTO: 40  TR score: 45  05/25/2022:   FOTO: 50 TR score: 45  07/20/22: FOTO 55  UPPER EXTREMITY ROM     Active ROM Left eval Left  03/21/2022 Left 05/25/2022 L  07/20/22 Left 07/25/2022 Left  08/15/2022 Left  09/26/2022 Left  11/16/2022 Left 12/28/2022  Shoulder flexion 119 123 125 130  136 138 130 142  Shoulder abduction 110 115 115 115  118 121 125 142  Shoulder adduction           Shoulder extension           Shoulder internal rotation           Shoulder external rotation           Elbow flexion 135(135) 145 145 145  145     Elbow extension -27(-18) -21(-20) -20(-16)  -25(-10) -23(-10) -21(-10) -20(-18) -20(-18)  Wrist flexion           Wrist extension 10(50) 19(50) 28(50)  30(50) 35(55) 36(55) 36(60) 40(60)  Wrist ulnar deviation           Wrist radial deviation           Wrist pronation           Wrist supination   Limited by flexor tone        (Blank rows = not tested)    Active ROM Right eval Right 03/21/2022 Right 05/25/2022 R  07/20/22 Right  07/25/22 Right 08/15/2022 Right 09/26/2022 Right 11/16/2022 Right 12/28/2022  Shoulder flexion 106 scaption 105  Scaption 62 flexion 110 scaption 100  105 105 105 112 Scaption  Shoulder abduction 114 90 97 100  105 117 120 124  Shoulder adduction           Shoulder extension           Shoulder internal rotation           Shoulder external rotation           Elbow flexion 120(130) 145 145 140  144 140 142 148  Elbow extension -45(-35) -22(-35) -28(-22)  -32(-21) -32(-28) -28(-26) -28(-28) -27(-26)  Wrist flexion           Wrist extension -30(10) -25(10) -10(30)  -10(20) -10(25) -20(40) -30(10) -22(34)  Wrist ulnar deviation           Wrist radial deviation           Wrist pronation           Wrist supination   Limited by flexor tone          12/28/2022:  Thumb radial abduction: Right: 12(46), Left: 40(54)  Digits: Bilateral 2nd through 5th digit: MP Hyperextension with PIP, an DIP flexion     UPPER EXTREMITY MMT:  Left eval Left 03/21/2022 Left  05/25/2022 L 07/20/22 Left  08/15/2022 Left 09/26/2022 Left 11/16/2022 Left 12/28/2022  Shoulder flexion 3-/5 3/5 3+/5 4+/5 4+/5 4+/5 4+/5 4+/5  Shoulder abduction 3-/5 3-/5 4-/5 4+/5 4+/5 4+5 4+/5 4+/5  Shoulder adduction          Shoulder extension          Shoulder internal rotation          Shoulder external rotation          Elbow flexion 3/5 3+/5 4-/5 4+/5 5/5 5/5 5/5 5/5  Elbow extension 2-/5 2/5  4+/5 4+/5 4+/5  4/5  Wrist flexion          Wrist extension 2/5 2+/5 3-/5  3-/5 3-/5 3-/5 3/5  Wrist ulnar deviation          Wrist radial deviation          Wrist pronation          Wrist supination          (Blank rows = not tested)  Right eval Right 03/21/2022 Right  05/25/2022 R 07/20/22 Right 08/15/2022 Right 09/26/2022 Right 11/16/2022 Right 12/28/2022  Shoulder flexion 3-/5 scaption 3-/5 3-/5 3+/5 3+/5 3+/5 3/5 3/5  Shoulder abduction 3-/5 3-/5 3-/5 4/5 4/5 4/5 4/5 4/5  Shoulder adduction          Shoulder extension          Shoulder internal rotation          Shoulder external rotation          Elbow flexion 3/5 3/5 3+/5 4/5 4+/5 4+/5 4+/5 4+/5  Elbow extension 2-/5 2/5 3-/5 4+/5 4+/5 4+/5 4+/5 4-/5  Wrist flexion          Wrist extension 2-/5 2-/5 2/5 2/5 2/5 2/5 2/5 2/5  Wrist ulnar deviation          Wrist radial deviation          Wrist pronation          Wrist supination            HAND FUNCTION:  Grip strength: Right: 0 lbs; Left: 0 lbs and Lateral pinch: Right: 5 lbs, Left: 2 lbs  03/21/2022: Lateral pinch: Right: 3.5 lbs, Left: 2 lbs  07/20/22: Grip strength: Right: 3 lbs; Left: 4 lbs and Lateral pinch: Right: 5 lbs, Left: 3 lbs  08/15/2022:  Grip strength: Right: 0 lbs; Left: 5 lbs and Lateral pinch: Right: 2 lbs, Left: 4 lbs  09/26/2022  Grip strength:  Right: 0 lbs; Left: 8 lbs and Lateral pinch: Right: 2 lbs, Left: 5 lbs  11/16/2022  Grip strength: Right: 0 lbs; Left: 8 lbs  9/25/20204  Grip strength: Right: 0 lbs; Left: 8 lbs    Bilateral digit PIP/DIP flexion contractures with MP hyperextension with attempts for AROM. Pt. is able to tolerate AROM to the bilateral digits at the initial evaluation however, has a history of pain in the digits.  COORDINATION: Eval: Pt. is unable to grasp 9-hole test pegs. Pt. is able to initiate grasping larger pegs, and is able to hold a pen in the left hand.  07/20/22: 2 min 36 seconds to remove 9 pegs from 9 hole peg test - cues to locate pegs 2/2 low vision. Pt. is able to initiate grasping larger pegs on R hand and is able to hold a pen in the left hand.  08/15/2022:      Vision, and sensation limiting accuracy of  9 hole peg test results. Pt. Was able to grasp and remove vertical pegs intermittently with cues.    SENSATION: Light touch: Impaired   EDEMA:  N/A  MUSCLE TONE: BUE flexor Spasticity  COGNITION Overall cognitive status: Continue to assess in functional context  VISION:   Subjective report: Pt. was not wearing glasses at the time of the initial eval.  Baseline vision: Vision is very limited. Wears glasses all the time Visual history: History of impaired vision following CVA. Pt. Has received treatment through the [facility] low vision rehabilitation program.   VISION ASSESSMENT: Impaired To be further assessed in functional context  PERCEPTION: Impaired   PRAXIS: Impaired: motor planning  OBSERVATIONS:  Pt reports being on Tramadol   TODAY'S TREATMENT     Therapeutic Ex.:  Pt. performed AROM for right shoulder flexion, abduction. Emphasis was placed on slow prolonged gentle passive stretching for RUE, forearm supination, bilateral wrist extension, wrist extension, digit MP, PIP, and DIP extension following moist heat. ROM was performed to decrease stiffness, and prepare  the RUE for functional use. ROM was performed following moist heat to the shoulder right elbow, forearm, wrist, and digits. PROM for bilateral digit MP, PIP, and DIP extension in preparation for placing them onto a flat surface. Pt. worked on holding an EZ roll with a gross grasp, and performing reps of supination, followed by using a lateral grasp on the EZ Board dial attachment. Pt. Worked on holding it stable while performing supination reps.    PATIENT EDUCATION: Education details: Doctor, general practice and fork positioning for self feeding Person educated: Patient and Parent Education method: Explanation, Demonstration, Tactile cues, and Verbal cues Education comprehension: verbalized understanding, returned demonstration, verbal cues required, and needs further education  HOME EXERCISE PROGRAM:  Continue ongoing assessment, and continue to provide as needed.   GOALS: Goals reviewed with patient? Yes  SHORT TERM GOALS: Target date: 11/07/2022   To assess splint fit, and make appropriate adjustments to promote good skin integrity through the palmar surface of the bilateral hands.  Baseline: 05/25/22: Goal currently met, however ongoing as needs to assess splint fit arise. 03/23/2022: Pt. is wearing splints a couple of hours at night bilateral resting hand splints. 03/21/2022: Pt. is wearing splints a couple of hours at night bilateral resting hand splints. Goal status: Deferred   LONG TERM GOALS: Target date: 05/08/2023    FOTO score will Improve by 2 points for Pt. perceived improvement with the assessment specific ADL/IADL tasks.  Baseline:  12/28/2022: FOTO score: 56; 11/16/2022: 52 09/27/2022: 51 07/20/2022: FOTO 55 05/25/2022: FOTO score: 50,  TR score: 45 Eval: FOTO score: 40,  TR score: 45 Goal status: Achieved   2.   Pt. will independently perform oral care for 100% of the task after complete set-up. Baseline: 02/13/23: Pt. Continues to require assist proximally 01/09/2023: Continue  12/28/2022:  Pt. Continues to complete 90% of the task with complete set-up, with visual cues, and occassional assist of the right elbow. 11/16/2022: Pt. Completes 90% of the task with complete set-up. 09/26/2022: Completes 90% of the task, proximal assist at times required at the elbow.  08/15/2022: Completes 90% of the task, proximal assist at times required at the elbow. 07/20/22: completes 90% of task, limited by shoulder flexion. 05/25/2022:  Pt. Is able to initiate and perform oral care for approximately 90% of the task. Complete set-up required. Assi needed only for the very back teeth. 03/23/2022: Pt. Is able to initiate and perform oral care for approximately 75%  of the task. Pt. Requires assist at proximally at the elbow for through oral care. 03/21/2022: Pt. Is able to initiate and perform oral care for approximately 75% of the task. Pt. Requires assist at proximally at the elbow for through oral care. Eval: Pt. is able to initiate using an electric toothbrush. Pt. requires assist for set-up, and assist for thoroughness, and as he Pt. fatigues. Goal status: Ongoing  3.  Pt. Will be modified independence with self-feeding for 100% using a swivel spoon, and standard fork after complete set-up Baseline: 02/13/23:  Pt. Is now able to consistently, and efficiently use a swivel spoon for eating cereal with the left hand.01/09/2023: Continue 12/28/2022: Pt. Is now feeding himself 90% of the time using a swivel spoon with the left hand, and 95% of the time with the fork. Pt. Continues to have difficulty with cutting food. 11/16/2022: 100% with finger foods, Pt. Is initiating with a swivel spoon, and universal cuff for fork use, however requires assist for consistency, and accuracy.  09/26/2022: 90% using the spoon, assist at time with the forearm motion, and grip on the utensils. 100% for finger foods.  08/15/2022:  90% using the spoon, assit at time with the forearm motion, and grip on the utensils. 100% for finger  foods-independent with set-up- unsupervised.  07/20/22: self-feeds cereal using spoon 90% of task. 05/25/2022: Pt. Is able to use a spoon to scoop cereal when feeding himself cereal 85% of the time. Pt. Is able to feed himself snack/finger foods 100% of the time. Pt. Continues to work on consistency of  stabilizing a cup/mug when drinking. Pt. Is able to grasp a water bottle with assist initially, with assist tapering off as he drinks.03/23/2022: Pt. Is able to perform scooping cereal for 75% of the time. Pt. required assist, and support at the left elbow, and Pt. Presents with limited forearm supination when using the spoon, and bringing it towards his mouth. Pt. Is able to use a fork to spear items, and perform the hand to mouth pattern.  03/21/2022: Pt. Is able to perform scooping cereal for 75% of the time. Pt. required assist, and support at the left elbow, and Pt. presents with limited forearm supination when using the spoon, and bringing it towards his mouth. Pt. Is able to use a fork to spear items, and perform the hand to mouth pattern.  Eval: Pt. is able to hold standard standard utensils. Pt. Performs as much of the task as he, can and has assistance for the remainder. Goal status:   Ongoing  4.  Pt. Will improve grasp patterns and consistently grasp 1/4" objects for ADL, and IADL tasks.  Baseline: 02/13/2023: 1/2" objects efficiently 01/09/2023: Continue 12/28/2022: Continues to grasp 1/4" pegs with min a + visual cues, consistently grasping 1/2" objects with visual cues 11/16/2022: Continues to grasp 1/4" pegs with min a + visual cues, consistently grasping 1/2" objects with visual cues 09/26/2022: Continues to grasp 1/4" pegs with min a + visual cues, consistently grasping 1/2" objects with visual cues. 08/15/2022: grasps 1/4" pegs with min a + visual cues, consistently grasping 1/2" objects with visual cues. 07/20/22: grasps 1/4" pegs with min a + visual cues, consistently grasping 1/2" objects with  visual cues. 05/25/2022: Pt. Is working on improving consistency of grasping 1/2" objects with visual cues.  03/23/2022: Pt. Is able to grasp 1" objects consistently,and continues to work on the hand patterns needed to grasp 1/2" objects.03/21/2022: Pt. Is able to grasp 1" objects consistently,and continues  to work on the hand patterns needed to grasp 1/2" objects. Eval: Pt. is able to grasp 1" objects intermittently using a lateral grasp pattern. Goal status: Ongoing  5.  Pt. will independently write his name legibly with letter sizes under 1". Baseline: 02/13/2023: Continue 01/09/2023: Continue. 12/28/2022: Pt. is now using an IPad Pen. 11/16/2022: Pt. Continues to be able to write name with smaller letter size. Pt. Is signing name with a computer styus. Pt. Requires visual cues, and assist 09/26/2022: Pt. Continues to be able to write name with smaller letter size. Pt. Is signing name with a computer styus. Pt. Requires visual cues, and assist. 08/15/2022: Pt. Is able to write name with smaller letter size. Pt. Is signing name with a computer styus. Pt. Requires visual cues, and assist. 07/20/22: stabilizing assist to write name with 75% legibility with 2" letters. 05/25/2022: Pt. to continue to work towards formulating grasp patterns in preparation for grasping a large width pen. Pt. Requires visual cues. 03/23/2022: Pt. Is able to write his name with modA, however has difficulty with formulating letter sizes less than 2" in size with 50% legibility for the 3 letters of his name.03/21/2022: Pt. Is able to write his name with modA, however has difficulty with formulating letter sizes less than 2" in size with 50% legibility for the 3 letters of his name. Eval: Pt is able to hold a thin marker with his left hand, and formulate a line, and initiate a circular pattern (Pt. without glasses today) Goal status: Ongoing  6. Pt. Will reach up to comb/brush his hair with minA.  Baseline: 02/13/2023:Pt. is able to use a  hair pick with the left hand on the right side of his head, top, and back of the head. Pt. Continues to have difficulty reaching further to the right side of his head with the left. 01/09/2023: Continue 12/28/2022: Pt. Is progressing, and is now able to use a hair pick with the left hand on the right side of his head, top, and back of the head. Pt. Continues to have difficulty reaching further to the right side of his head with the left. 11/16/2022: Pt. Is able to use a hair pick for the left side of his head using his left hand. Pt. Presents with difficulty reaching to the right side of his head.09/26/2022: Pt. Is able to use a hair pick for the left side of his head using his left hand. Pt. Presents with difficulty reaching to the right side of his head. 5/13/202: 75% using a hair pick, assist to the right side of the head. 07/20/22: reaches 75% of head, assist for far R side of head, fatigues quickly. 05/25/2022: Pt. Is able to reach up with the left hand to the left side, top, and back of his head. 03/23/2022: Pt. is now able to more consistently initiate reaching up to his head with his left hand in preparation for haircare12/18/2023: Pt. is now able to more consistently initiate reaching up to his head with his left hand in preparation for haircare. Eval: Pt. is able to initiate reaching up for hair care with a long handled brush, however is unable to sustain UE's in elevation to perform the task.     Goal status: Ongoing  7. Pt. Will independently navigate the w/c through his environment with minA with visual scanning, and hand placement on the controls.  Baseline: 11/16/2022: Deferred 2/2 vision 09/26/2022: Pt. Is now navigating his w/c ouside the home, and around his block. 08/15/2022:  Pt. Requires minA to setup hand on controls and MIN cues to navigate the w/c in wide spaces, requires MIN - MOD cues to navigate the w/c through more narrow doorways, and tighter turns - varies based on good vs bad vision  days.07/20/22: Pt. Requires minA to setup hand on controls and MIN cues to navigate the w/c in wide spaces, requires MIN - MOD cues to navigate the w/c through more narrow doorways, and tighter turns - varies based on good vs bad vision days. 05/25/2022: Pt. Requires minA  and  cues to navigate the w/c in wide spaces, and requires Mod cues to navigate the w/c through more narrow doorways, and tighter turns. Pt. Requires max cues for scanning through the environment, and moderate cues for hand placement on the controls.     Goal status: Deferred 2/2 vision    8. Pt. Will improve bilateral grip strength to be able to independently grasp, and pull up blankets, and linens.   Baseline: 02/13/2023: Continue. Pt. presents with digit PIP, and DIP flexor tightness. 01/09/2023: Continue 12/28/2023: R: 0#, L: 5# Pt. Is now able to more consistently grasp and pull blankets up over him. 11/16/2022: R: 0 L: 8# Pt. Is more consistently grasping his blankets and attempting to pull them up. 09/26/2022: R: 0  L: 8# Pt. is attempting to grasp, and pull blankets up more, using mostly the left hand. 08/15/2022: Pt. has difficulty securely holding, and pulling up blankets, and linens.    Goal status: Ongoing  9. Pt. will consistently actively control the releasing  of blankets, covers, and linens from his hands once they are in the desired position over him. Baseline:12/28/2022: Pt. Is consistently actively able to release linens once they are in the desired position over him. 11/16/2022: Pt. continues to improve with actively releasing blankets form his hands. 09/26/2022: Pt. is improving with actively releasing blankets form his hands. 08/15/2022: Pt. has difficulty with controlled releasing of blankets/linens from his grasp.     Goal status: Achieved    10. Pt. Will improve bilateral UE strength by 2 MM grades to assist with ADLs, and IADLs  Baseline: 02/13/2023: Continue 01/09/2023: Continue 12/28/2022: Right: shoulder flexion: 3/5,  abduction: 4/5, elbow flexion: 4+/5 , extension: 4-/5 wrist extension: 2/5; Left: shoulder flexion: 4+/5, abduction: 4+/5, elbow flexion: 5/5 , extension: 4/5  wrist extension: 3/5 Right: shoulder flexion: 3+/5, abduction: 4/5, elbow flexion: 4+/5 , extension: 4+/5 wrist extension: 2/5; Left: shoulder flexion: 4+/5, abduction: 4+/5, elbow flexion: 5/5 , extension: 4+/5 wrist extension: 3-/5 11/16/2022: Right: shoulder flexion: 3/5, abduction: 4/5, elbow flexion: 4+/5 , extension: 4+/5 wrist extension: 2/5; Left: shoulder flexion: 4+/5, abduction: 4+/5, elbow flexion: 5/5 , extension:  wrist extension: 3-/5 Right: shoulder flexion: 3+/5, abduction: 4/5, elbow flexion: 4+/5 , extension: 4+/5 wrist extension: 2/5; Left: shoulder flexion: 4+/5, abduction: 4+/5, elbow flexion: 5/5 , extension: 4+/5 wrist extension: 3-/5 Goal status: Ongoing  11. Pt. Will improve right wrist extension by 10 degrees to initiate reaching for items in preparation for ADLs. Baseline: 02/13/2023: Continue 01/09/2023: Continue 12/28/2022: -22(34) 11/16/2022: -30(10)    Goal status: Progressing   ASSESSMENT:  CLINICAL IMPRESSION:  Pt. tolerated moist heat and stretching well today, however reports tightness in the right wrist. Pt. Is use a gross grasp to securely hold and EZ Board roll attachment while performing supination reps. Pt. presents with increased difficulty securely holding an EZ Board dial attachment with a lateral pinch while adding a combing supination, or wrist extension movements  Pt.  continues to present with digit PIP, and DIP flexor tightness. Pt. continues to be highly motivated to continue working towards improving BUE functioning, ROM, strength, motor control, [facility] skills, as well as visual compensatory strategies in order to maximize independence with daily ADLs, and IADL functioning.      PERFORMANCE DEFICITS in functional skills including ADLs, IADLs, coordination, dexterity, proprioception, ROM, strength,  pain, FMC, GMC, decreased knowledge of use of DME, and UE functional use, cognitive skills including safety awareness, and psychosocial skills including coping strategies, environmental adaptation, habits, and routines and behaviors.   IMPAIRMENTS are limiting patient from ADLs, IADLs, leisure, and social participation.   COMORBIDITIES may have co-morbidities  that affects occupational performance. Patient will benefit from skilled OT to address above impairments and improve overall function.  MODIFICATION OR ASSISTANCE TO COMPLETE EVALUATION: Maximum or significant modification of tasks or assist is necessary to complete an evaluation.  OT OCCUPATIONAL PROFILE AND HISTORY: Comprehensive assessment: Review of records and extensive additional review of physical, cognitive, psychosocial history related to current functional performance.  CLINICAL DECISION MAKING: High - multiple treatment options, significant modification of task necessary  REHAB POTENTIAL: Fair    EVALUATION COMPLEXITY: High    PLAN: OT FREQUENCY: 2 x's a week  OT DURATION:12 weeks  PLANNED INTERVENTIONS: self care/ADL training, therapeutic exercise, therapeutic activity, neuromuscular re-education, manual therapy, passive range of motion, patient/family education, and cognitive remediation/compensation RECOMMENDED OTHER SERVICES: PT  CONSULTED AND AGREED WITH PLAN OF CARE: Patient and family member/caregiver  PLAN FOR NEXT SESSION: Initiate treatment  Olegario Messier, MS, OTR/L   02/15/23, 5:08 PM

## 2023-02-16 NOTE — Therapy (Signed)
OUTPATIENT PHYSICAL THERAPY TREATMENT     Patient Name: Paul Hall MRN: [id number] DOB:13-Aug-1995, 27 y.o., male Today's Date: 02/16/2023  PCP:  Cindi Carbon PROVIDER:  Lenise Herald, PA-C   PT End of Session - 02/15/23 0820     Visit Number 135    Number of Visits 146    Date for PT Re-Evaluation 02/22/23    Authorization Type [employer] COMM [place] [place] [employer]    Progress Note Due on Visit 140    PT Start Time 1615    PT Stop Time 1700    PT Time Calculation (min) 45 min    Equipment Utilized During Treatment Gait belt    Activity Tolerance Patient tolerated treatment well    Behavior During Therapy WFL for tasks assessed/performed                     Past Medical History:  Diagnosis Date   Diabetes mellitus (HCC)    Hypertension    Stroke (HCC)    Past Surgical History:  Procedure Laterality Date   IVC FILTER PLACEMENT ([facility] HX)     LEG SURGERY     PEG TUBE PLACEMENT     TRACHEOSTOMY     Patient Active Problem List   Diagnosis Date Noted   Sepsis due to vancomycin resistant Enterococcus species (HCC) 06/06/2019   SIRS (systemic inflammatory response syndrome) (HCC) 06/05/2019   Acute lower UTI 06/05/2019   VRE (vancomycin-resistant Enterococci) infection 06/05/2019   Anemia 06/05/2019   Skin ulcer of sacrum with necrosis of muscle (HCC)    Urinary retention    Type 2 diabetes mellitus without complication, with long-term current use of insulin (HCC)    Tachycardia    Lower extremity edema    Acute metabolic encephalopathy    Obstructive sleep apnea    Morbid obesity with BMI of 60.0-69.9, adult (HCC)    Goals of care, counseling/discussion    Palliative care encounter    Sepsis (HCC) 04/27/2019   H/O insulin dependent diabetes mellitus 04/27/2019   History of CVA with residual deficit 04/27/2019   Seizure disorder (HCC) 04/27/2019   Decubitus ulcer of sacral region, stage 4 (HCC) 04/27/2019    REFERRING DIAG: Cerebral infarction,  unspecified   THERAPY DIAG:  Muscle weakness (generalized)  Other lack of coordination  History of CVA with residual deficit  Difficulty in walking, not elsewhere classified  Unsteadiness on feet  Rationale for Evaluation and Treatment Rehabilitation  PERTINENT HISTORY: Paul Hall is a 26yoM who presents with severe weakness, quadriparesis, altered sensorium, and visual impairment s/p critical illness and prolonged hospitalization. Pt hospitalized in October 2020 with ARDS 2/2 COVID19 infection. Pt sustained a complex and lengthy hospitalization which included tracheostomy, prolonged sedation, ECMO. In this period pt sustained CVA and SDH. Pt has now been liberated from tracheostomy and G-tube. Pt has since been hospitalized for wound infection and UTI. Pt lives with parents at home, has hospital bed and left chair, hoyer lift transfers, and power WC for mobility needs. Pt needs heavy physical assistance with ADL 2/2 BUE contractures and motor dysfunction   PRECAUTIONS: Fall  SUBJECTIVE:  Patient reports feeling well overall without any issues today.     PAIN:  Are you having pain? No   TODAY'S TREATMENT:  - 02/16/23       *Dependent transfer from w/c to edge of mat using hoyer lift (+2 person assist)     Therapeutic activities: (at edge of mat)  Static sit x 3  min without Back support-  Sit to stand (+2 assist - SPT on one side and PT on other) - max assist x 3 trials- VC to push through forearm support and to lean forward (nose over toes)  Static standing trials x 3 - All less than 1 min each (max effort from patient and max assist +2 to remain in standing). Patient exhibited difficulty with standing erect and on second trial - he was able to accept feedback to engage glutes better and look ahead for his best quality stand. He required increased rest break as he was straining hard with standing and can become dizzy at times- None today however.  On last stand- he was  fatigued and unable to clear his bottom off mat well- shaking and exhausted.   Discussion with patient and mother about trying to get into position to stretch possible tight B hip flexors- visually demon some techniques to try at home.         PATIENT EDUCATION: Education details: Progression of intensity at home with trunk strength exercises and how to grade intensity using a goniometer bubble app.   HOME EXERCISE PROGRAM:  Access Code: [email or web address] URL: [email or web address] Date: 03/23/2022 Prepared by: Maureen Ralphs  Exercises - Supine Bridge  - 3 x weekly - 3 sets - 10 reps - 2 hold - Supine Gluteal Sets  - 3 x weekly - 3 sets - 10 reps - 5 sec hold - Supine Quad Set  - 1 x daily - 3 x weekly - 3 sets - 10 reps - 5 hold - Seated Long Arc Quad  - 1 x daily - 7 x weekly - 3 sets - 10 reps - Seated Hip Adduction Squeeze with Ball  - 1 x daily - 3 x weekly - 3 sets - 10 reps - 5 hold - Seated Hip Abduction  - 1 x daily - 3 x weekly - 3 sets - 10 reps - 2 hold    PT Short Term Goals -       PT SHORT TERM GOAL #1   Title Pt will be independent with HEP in order to improve strength and balance in order to decrease fall risk and improve function at home and work.    Baseline 01/04/2021= No formal HEP in place; 12/12 no HEP in place; 05/10/2021-Patient and his father were able to report compliance with curent HEP consisting of mostly seated/reclined LE strengthening. Both verbalize no questions at this time.    Time 6    Period Weeks    Status Achieved    Target Date 02/15/21            PT LONG TERM GOAL #1     Title Patient will increase BLE gross strength by 1/2 muscle grade to improve functional strength for improved independence with potential gait, increased standing tolerance and increased ADL ability.     Baseline 01/04/2021- Patient presents with 1/5 to 3-/5 B LE strength with MMT; 12/12: goal partially met for Left knee/hip; 05/10/2021= 2-/5 bilateal Hip  flex; 3+/5 bilateral Knee ext; 06/21/2021= Patient presents with 2-/5 bilateral Hip flex; 3+/5 bilateral knee ext/flex; 2-/5 left ankle DF; 0/5 right ankle- and able to increase reps and resistance with LE's. 09/15/2021- Patient technically presents with 2-/5 B hip flex/abd/add - but he is able to raise his hip up to approx 100 deg which has improved. 3+/5 Bilateral knee ext, 2-/5 left ankle and 0/5 right ankle.  12/08/2021= Patient able to lift left knee  at 110 deg of hip flex; presents with 3+/5 knee ext, 2-/5 left ankle DF and 0/5 right ankle DF, 2-/5 bilateral Hip abd in seated position.    12/6: R: knee 3+/5 ext, 2/5 flexion, left knee 3+/5 extension, 3+/5 flexion, R hip: 2+/5 hip add, 2+/5 hip ABD L hip: 4-/5 hip ABD, 3+/5 hip ADD, 3+/5 hip flexion; 06/06/2022= Patient now presents with 2-/5 right ankle DF/PF;     Time 12     Period Weeks     Status MET    Target Date 03/07/2022         PT LONG TERM GOAL #2    Title Patient will tolerate sitting unsupported demonstrating erect sitting posture for 15 minutes with CGA to demonstrate improved back extensor strength and improved sitting tolerance.     Baseline 01/04/2021- Patient confied to sitting in lift chair or electric power chair with back support and unable to sit upright without physical assistance; 12/12: tolerates <1 minutes upright unsupported sitting. 05/10/2021=static sit with forward trunk lean  in his power wheelchair without back support x approx 3 min. 06/21/2021=Unable to assess today due to patient with acute back pain but on previous visit able to sit x 8 min without back support. 09/15/2021- on last visit- 09/13/2021- patient was able to sit unsupported x 8 min at edge of mat. 10/13/2023 - Patient was able to sit at edge of mat with varying level of assist today from SBA to min A for a total of 20 min. 12/13/2021= Patient demonstrated unsupported sitting at edge of mat for approx 20 min    Time 12     Period Weeks     Status GOAL MET    Target  Date 12/08/21          PT LONG TERM GOAL #3    Title Patient will demonstrate ability to perform static standing in // bars > 2 min with Max Assist  without loss of balance and fair posture for improved overall strength for pre-gait and transfer activities.     Baseline 01/04/2021= Patient current uanble to stand- Dependent on hoyer or sit to stand lift for transfers. 05/10/2021=Not appropriate yet- Currently still dependent with all transfers using hoyer. 06/21/2021= Patient continuing now to focus on LE strengthening to prepare for standing-unable to try today due to acute low back pain-  planning on attempting in new cert period. 09/15/2021- Patient has attempted standing 2x in past two week- max Assist of 2 people - only once was he successful to clearing his bottom from chair - Will continue to be a focus during the new certification. 12/13/2021= Patient has been limited secondary to increased overall low back pain during this certification and will require more time to focus on this goal.  12/6: not assessed this date, will assess at date when 2-3 PTs are present for assistance     Time 12     Period Weeks     Status GOAL not appropriate at this time - may attempt in future once Patient presents with improved overall LE strength.                 PT LONG TERM GOAL #4    Title Pt will improve FOTO score by 10 points or more demonstrating improved perceived functional ability     Baseline FOTO 7 on 10/17; 03/15/21: FOTO 12; 05/10/2021 06/21/2021= 1; 09/15/2021= 9; 12/13/2021= Will issue next visit 12/6: 4; 06/06/2022= Will assess next visit; Goal not appropriate as  patient is not ambulatory.     Time 12     Period Weeks     Status WITHDRAWN    Target date 11/30/2022         PT LONG TERM GOAL #5    Title Patient will perform sit to stand transfer with appropriate AD and max assist of 2 people with 75% consistency to prepare for pregait activities.     Baseline 09/15/2021= Patient unable to stand well- unable  to clear his bottom off chair with Max assist of 2 persons. 12/13/2021- Goal not appropriate to try yet but will keep and roll over to next cert as shift continues to focus on transfers/standing; 4/24= Patient able to perform active ankle DF/PF with right LE and able to raise his knee into seated march and clear floor without physical assist today - as previously unable as well as lift right knee ext to near full ROM to improve strength for eventual standing. 09/07/2022- Goal continues to not be appropriate but patient is now standing some and will keep goal active; 11/23/2022= patient is now performing sit to stand with max A +2 and now able to clear his bottom off mat. 11/30/2022- Patient continues to perform sit to stand transfers with Max A+2 and able to clear bottom well off mat but not erect yet-     Time 12     Period Weeks     Status PROGRESSING    Target date  02/22/2023       PT LONG TERM GOAL #6  Title Patient will tolerate sitting unsupported demonstrating erect sitting posture for 30 minutes with CGA to demonstrate improved back extensor strength and improved sitting tolerance.   Baseline 12/13/2021= Patient demonstrated unsupported sitting at edge of mat for approx 20 min;  12/29/2021- Patient performed approx 30 min of dynamic sitting activities today. 06/06/2022= Patient demonstrated ability to sit and perform static and dynamic UE/LE movement with only Supervision.   Time 12   Period Weeks   Status GOAL MET           7.  Patient will tolerate 2 minutes or more of standing in sit to stand lift or with max assist +2 in order to indicate improved lower extremity weightbearing tolerance for progression to standing in parallel bars. Baseline: 1 minute on most recent stand 02/21/22; 06/06/2022- Patient did attempt today after complaining of right LE pain last week. He attempted 3 stands using sit to stand lift. - all over 1 min- last one approx 48 sec- stopped due to fatigue. More erect  standing in lift today - still poor gluteal strength but able to activate glutes and extend hips upon command but unable to hold > 5 sec. 07/28/2022- Will attempt standing next visit but patient has been able to stand since last progress note for up to 3 min at a time at his best. 09/07/2022-  attempted standing with max +2 and patient able to stand 15-20 sec so will now  revise goal; 11/23/2022- Patient at his best between last 2 visits was able to stand approx 50 sec with max A+2.  11/30/2022- Patient performed 4 trials of static standing - max A +2- clearing bottom and using forearms to bear weight  Goal status: Revised Target date: 02/22/2023   8. Patient will tolerate sitting unsupported demonstrating ability to perform dynamic UE/LE activities for 30 minutes  independently to demonstrate ability to sit at edge of bed to eat or perform some ADL's/exercise for optimal  quality of life.             Baseline: 06/06/2022- Patient able to sit and perform some UE/LE exercises but requires CGA at times for safety- he is able to static sit for 30 min with supervision. 07/27/2022= Patient able to now sit > 30 min with Supervision only - performing static and dynamic activities. Will keep goal active to ensure patient is consistent. 09/07/2022=  Patient able to demonstrate consistent ability to sit at edge of mat during visits             Goal Status: MET             Target date: 08/29/2022        Plan     Clinical Impression Statement  Patient exhibited great effort today. He was motivated to stand and willing to accept criticism/feedback on how to stand better and the need to incorporate more UE support to help. He then performed one of his best stands to date with more erect posture and improve bottom clearance from mat. He did however fatigue very quickly and required increase risk to avoid overstraining. Pt will continue to benefit from skilled physical therapy intervention to address impairments, improve QOL,  and attain therapy goals.    Personal Factors and Comorbidities Comorbidity 3+;Time since onset of injury/illness/exacerbation    Comorbidities CVA, diabetes, Seizures    Examination-Activity Limitations Bathing;Bed Mobility;Bend;Caring for Others;Carry;Dressing;Hygiene/Grooming;Lift;Locomotion Level;Reach Overhead;Self Feeding;Sit;Squat;Stairs;Stand;Transfers;Toileting    Examination-Participation Restrictions Cleaning;Community Activity;Driving;Laundry;Medication Management;Meal Prep;Occupation;Personal Finances;Shop;Yard Work;Volunteer    Stability/Clinical Decision Making Evolving/Moderate complexity    Rehab Potential Fair    PT Frequency 2x / week    PT Duration 12 weeks    PT Treatment/Interventions ADLs/Self Care Home Management;Cryotherapy;Electrical Stimulation;Moist Heat;Ultrasound;DME Instruction;Gait training;Stair training;Functional mobility training;Therapeutic exercise;Balance training;Patient/family education;Orthotic Fit/Training;Neuromuscular re-education;Wheelchair mobility training;Manual techniques;Passive range of motion;Dry needling;Energy conservation;Taping;Visual/perceptual remediation/compensation;Joint Manipulations    PT Next Visit Plan core strength/motor control in short-sitting, sit to stand if able; Continue with progressive LE Strengthening. Stand activities as appropriate.     PT Home Exercise Plan No changes to HEP today    Consulted and Agree with Plan of Care Patient;Family member/caregiver    Family Member Consulted            8:36 AM, 02/16/23 Louis Meckel, PT Physical Therapist - [place] [facility] [phone]    Debara Pickett, SPT  This entire session was performed under direct supervision and direction of a licensed therapist/therapist assistant . I have personally read, edited and approve of the note as written.    02/16/2023, 8:36 AM

## 2023-02-20 ENCOUNTER — Ambulatory Visit: Payer: BC Managed Care – PPO

## 2023-02-20 ENCOUNTER — Ambulatory Visit: Payer: BC Managed Care – PPO | Admitting: Occupational Therapy

## 2023-02-20 DIAGNOSIS — M6281 Muscle weakness (generalized): Secondary | ICD-10-CM | POA: Diagnosis not present

## 2023-02-20 DIAGNOSIS — R269 Unspecified abnormalities of gait and mobility: Secondary | ICD-10-CM

## 2023-02-20 DIAGNOSIS — R2681 Unsteadiness on feet: Secondary | ICD-10-CM | POA: Diagnosis not present

## 2023-02-20 DIAGNOSIS — R278 Other lack of coordination: Secondary | ICD-10-CM

## 2023-02-20 DIAGNOSIS — R262 Difficulty in walking, not elsewhere classified: Secondary | ICD-10-CM | POA: Diagnosis not present

## 2023-02-20 DIAGNOSIS — R2689 Other abnormalities of gait and mobility: Secondary | ICD-10-CM

## 2023-02-20 DIAGNOSIS — I6930 Unspecified sequelae of cerebral infarction: Secondary | ICD-10-CM | POA: Diagnosis not present

## 2023-02-20 NOTE — Therapy (Signed)
Occupational Therapy Neuro Treatment Note  Patient Name: Paul Hall MRN: [id number] DOB:1996/03/14, 27 y.o., male Today's Date: 02/20/2023  PCP: Dr. Sherwood Gambler REFERRING PROVIDER: Dr. Sherwood Gambler   OT End of Session - 02/20/23 1702     Visit Number [id number]    Number of Visits 84    Date for OT Re-Evaluation 05/08/23    Authorization Type 09/26/2022    OT Start Time 1530    OT Stop Time 1615    OT Time Calculation (min) 45 min    Equipment Utilized During Treatment tilt in space power wc    Activity Tolerance Patient tolerated treatment well    Behavior During Therapy WFL for tasks assessed/performed                  Past Medical History:  Diagnosis Date   Diabetes mellitus (HCC)    Hypertension    Stroke [facility])    Past Surgical History:  Procedure Laterality Date   IVC FILTER PLACEMENT ([facility] HX)     LEG SURGERY     PEG TUBE PLACEMENT     TRACHEOSTOMY     Patient Active Problem List   Diagnosis Date Noted   Sepsis due to vancomycin resistant Enterococcus species (HCC) 06/06/2019   SIRS (systemic inflammatory response syndrome) (HCC) 06/05/2019   Acute lower UTI 06/05/2019   VRE (vancomycin-resistant Enterococci) infection 06/05/2019   Anemia 06/05/2019   Skin ulcer of sacrum with necrosis of muscle (HCC)    Urinary retention    Type 2 diabetes mellitus without complication, with long-term current use of insulin (HCC)    Tachycardia    Lower extremity edema    Acute metabolic encephalopathy    Obstructive sleep apnea    Morbid obesity with BMI of 60.0-69.9, adult (HCC)    Goals of care, counseling/discussion    Palliative care encounter    Sepsis (HCC) 04/27/2019   H/O insulin dependent diabetes mellitus 04/27/2019   History of CVA with residual deficit 04/27/2019   Seizure disorder (HCC) 04/27/2019   Decubitus ulcer of sacral region, stage 4 (HCC) 04/27/2019   ONSET DATE: 01/2019  REFERRING DIAG: CVA/COVID-19  THERAPY DIAG:  Muscle weakness  (generalized)  Rationale for Evaluation and Treatment Rehabilitation  SUBJECTIVE:   SUBJECTIVE STATEMENT:  Pt. reports that they went to [place], [place] for a wrestling event this past weekend.  Pt accompanied by: self and family member  PERTINENT HISTORY:  Pt. is a 27 y.o. male who was diagnosed with COVID-19, and CVA with resultant quadriplegia. Pt. Was then hospitalized with VRE UTI. PMHx includes: urinary retention, seizure disorder, obstructive sleep disorder, DM Type II, Morbid obesity.   PRECAUTIONS: None  WEIGHT BEARING RESTRICTIONS No  PAIN:  Are you having pain? Reports right  wrist tightness.  FALLS: Has patient fallen in last 6 months? No  LIVING ENVIRONMENT: Lives with: lives with their family Lives in: House/apartment Stairs: No Level Entry Has following equipment at home: Wheelchair (power) and hoyer lift, sit to stand lift  PLOF: Independent  PATIENT GOALS: To be able to engage in more daily care tasks.  OBJECTIVE:   HAND DOMINANCE: Right  ADLs:  Transfers/ambulation related to ADLs: Eating: Pt. reports being able to hold standard utensils, and is starting to engage more in self-feeding tasks, hand to mouth patterns. Pt. Reports that he does as much as he can with the task, and family assists  with the remainder of the task. Grooming: Pt. Is able to initiate holding an electric toothbrush,  and brush his teeth. Family assists LB Dressing: Total Assist UE dressing: Pt. is now able to reach up to actively assist with grasping , and pulling his gown down. Toileting:  Total Assist Bathing: MaxA UB, Total assist LB Tub Shower transfers: N/A Equipment: See above    IADLs: Shopping: Relies on family to assist Light housekeeping: Total Assist Meal Prep: Total Assist Community mobility:   Medication management:  Total Assist  Financial management: N/A Handwriting: Not legible: Pt. Is able to hold a pen with the left hand, and initiate marking the page. Pt.'s  eye glasses were not available  MOBILITY STATUS:  Power w/c  POSTURE COMMENTS:  Pt. Requires position changes in his power w/c  ACTIVITY TOLERANCE: Activity tolerance:  Fair  FUNCTIONAL OUTCOME MEASURES: FOTO: 40  TR score: 45  05/25/2022:   FOTO: 50 TR score: 45  07/20/22: FOTO 55  UPPER EXTREMITY ROM     Active ROM Left eval Left  03/21/2022 Left 05/25/2022 L  07/20/22 Left 07/25/2022 Left  08/15/2022 Left  09/26/2022 Left  11/16/2022 Left 12/28/2022  Shoulder flexion 119 123 125 130  136 138 130 142  Shoulder abduction 110 115 115 115  118 121 125 142  Shoulder adduction           Shoulder extension           Shoulder internal rotation           Shoulder external rotation           Elbow flexion 135(135) 145 145 145  145     Elbow extension -27(-18) -21(-20) -20(-16)  -25(-10) -23(-10) -21(-10) -20(-18) -20(-18)  Wrist flexion           Wrist extension 10(50) 19(50) 28(50)  30(50) 35(55) 36(55) 36(60) 40(60)  Wrist ulnar deviation           Wrist radial deviation           Wrist pronation           Wrist supination   Limited by flexor tone        (Blank rows = not tested)    Active ROM Right eval Right 03/21/2022 Right 05/25/2022 R  07/20/22 Right  07/25/22 Right 08/15/2022 Right 09/26/2022 Right 11/16/2022 Right 12/28/2022  Shoulder flexion 106 scaption 105  Scaption 62 flexion 110 scaption 100  105 105 105 112 Scaption  Shoulder abduction 114 90 97 100  105 117 120 124  Shoulder adduction           Shoulder extension           Shoulder internal rotation           Shoulder external rotation           Elbow flexion 120(130) 145 145 140  144 140 142 148  Elbow extension -45(-35) -22(-35) -28(-22)  -32(-21) -32(-28) -28(-26) -28(-28) -27(-26)  Wrist flexion           Wrist extension -30(10) -25(10) -10(30)  -10(20) -10(25) -20(40) -30(10) -22(34)  Wrist ulnar deviation           Wrist radial deviation           Wrist pronation           Wrist supination    Limited by flexor tone          12/28/2022: Thumb radial abduction: Right: 12(46), Left: 40(54)  Digits: Bilateral 2nd through 5th digit: MP Hyperextension with PIP, an DIP flexion  UPPER EXTREMITY MMT:     Left eval Left 03/21/2022 Left  05/25/2022 L 07/20/22 Left  08/15/2022 Left 09/26/2022 Left 11/16/2022 Left 12/28/2022  Shoulder flexion 3-/5 3/5 3+/5 4+/5 4+/5 4+/5 4+/5 4+/5  Shoulder abduction 3-/5 3-/5 4-/5 4+/5 4+/5 4+5 4+/5 4+/5  Shoulder adduction          Shoulder extension          Shoulder internal rotation          Shoulder external rotation          Elbow flexion 3/5 3+/5 4-/5 4+/5 5/5 5/5 5/5 5/5  Elbow extension 2-/5 2/5  4+/5 4+/5 4+/5  4/5  Wrist flexion          Wrist extension 2/5 2+/5 3-/5  3-/5 3-/5 3-/5 3/5  Wrist ulnar deviation          Wrist radial deviation          Wrist pronation          Wrist supination          (Blank rows = not tested)  Right eval Right 03/21/2022 Right  05/25/2022 R 07/20/22 Right 08/15/2022 Right 09/26/2022 Right 11/16/2022 Right 12/28/2022  Shoulder flexion 3-/5 scaption 3-/5 3-/5 3+/5 3+/5 3+/5 3/5 3/5  Shoulder abduction 3-/5 3-/5 3-/5 4/5 4/5 4/5 4/5 4/5  Shoulder adduction          Shoulder extension          Shoulder internal rotation          Shoulder external rotation          Elbow flexion 3/5 3/5 3+/5 4/5 4+/5 4+/5 4+/5 4+/5  Elbow extension 2-/5 2/5 3-/5 4+/5 4+/5 4+/5 4+/5 4-/5  Wrist flexion          Wrist extension 2-/5 2-/5 2/5 2/5 2/5 2/5 2/5 2/5  Wrist ulnar deviation          Wrist radial deviation          Wrist pronation          Wrist supination            HAND FUNCTION:  Grip strength: Right: 0 lbs; Left: 0 lbs and Lateral pinch: Right: 5 lbs, Left: 2 lbs  03/21/2022: Lateral pinch: Right: 3.5 lbs, Left: 2 lbs  07/20/22: Grip strength: Right: 3 lbs; Left: 4 lbs and Lateral pinch: Right: 5 lbs, Left: 3 lbs  08/15/2022:  Grip strength: Right: 0 lbs; Left: 5 lbs and Lateral pinch: Right: 2  lbs, Left: 4 lbs  09/26/2022  Grip strength: Right: 0 lbs; Left: 8 lbs and Lateral pinch: Right: 2 lbs, Left: 5 lbs  11/16/2022  Grip strength: Right: 0 lbs; Left: 8 lbs  9/25/20204  Grip strength: Right: 0 lbs; Left: 8 lbs    Bilateral digit PIP/DIP flexion contractures with MP hyperextension with attempts for AROM. Pt. is able to tolerate AROM to the bilateral digits at the initial evaluation however, has a history of pain in the digits.  COORDINATION: Eval: Pt. is unable to grasp 9-hole test pegs. Pt. is able to initiate grasping larger pegs, and is able to hold a pen in the left hand.  07/20/22: 2 min 36 seconds to remove 9 pegs from 9 hole peg test - cues to locate pegs 2/2 low vision. Pt. is able to initiate grasping larger pegs on R hand and is able to hold a pen in the left hand.  08/15/2022:  Vision, and sensation limiting accuracy of 9 hole peg test results. Pt. Was able to grasp and remove vertical pegs intermittently with cues.    SENSATION: Light touch: Impaired   EDEMA:  N/A  MUSCLE TONE: BUE flexor Spasticity  COGNITION Overall cognitive status: Continue to assess in functional context  VISION:   Subjective report: Pt. was not wearing glasses at the time of the initial eval.  Baseline vision: Vision is very limited. Wears glasses all the time Visual history: History of impaired vision following CVA. Pt. Has received treatment through the [facility] low vision rehabilitation program.   VISION ASSESSMENT: Impaired To be further assessed in functional context  PERCEPTION: Impaired   PRAXIS: Impaired: motor planning  OBSERVATIONS:  Pt reports being on Tramadol   TODAY'S TREATMENT     Therapeutic Ex.:  Pt. performed AROM for right shoulder flexion, abduction. Emphasis was placed on slow prolonged gentle passive stretching for RUE, forearm supination, bilateral wrist extension, wrist extension, digit MP, PIP, and DIP extension following moist heat. ROM  was performed to decrease stiffness, and prepare the RUE for functional use. ROM was performed following moist heat to the shoulder right elbow, forearm, wrist, and digits. PROM for bilateral digit MP, PIP, and DIP extension in preparation for placing them onto a flat surface. Pt. performed 2# dowel ex. for UE strengthening secondary to weakness. Bilateral shoulder flexion and  chest press for 2 sets 10 reps each, circular patterns, and elbow flexion/extension exercises were performed for 1 set 10 reps each with rest breaks.  Pt. Worked on bilateral gross grip strengthening. Using 1 red level resistive band for multiple reps.     PATIENT EDUCATION: Education details: Doctor, general practice and fork positioning for self feeding Person educated: Patient and Parent Education method: Explanation, Demonstration, Tactile cues, and Verbal cues Education comprehension: verbalized understanding, returned demonstration, verbal cues required, and needs further education  HOME EXERCISE PROGRAM:  Continue ongoing assessment, and continue to provide as needed.   GOALS: Goals reviewed with patient? Yes  SHORT TERM GOALS: Target date: 11/07/2022   To assess splint fit, and make appropriate adjustments to promote good skin integrity through the palmar surface of the bilateral hands.  Baseline: 05/25/22: Goal currently met, however ongoing as needs to assess splint fit arise. 03/23/2022: Pt. is wearing splints a couple of hours at night bilateral resting hand splints. 03/21/2022: Pt. is wearing splints a couple of hours at night bilateral resting hand splints. Goal status: Deferred   LONG TERM GOALS: Target date: 05/08/2023    FOTO score will Improve by 2 points for Pt. perceived improvement with the assessment specific ADL/IADL tasks.  Baseline:  12/28/2022: FOTO score: 56; 11/16/2022: 52 09/27/2022: 51 07/20/2022: FOTO 55 05/25/2022: FOTO score: 50,  TR score: 45 Eval: FOTO score: 40,  TR score: 45 Goal status: Achieved    2.   Pt. will independently perform oral care for 100% of the task after complete set-up. Baseline: 02/13/23: Pt. Continues to require assist proximally 01/09/2023: Continue 12/28/2022:  Pt. Continues to complete 90% of the task with complete set-up, with visual cues, and occassional assist of the right elbow. 11/16/2022: Pt. Completes 90% of the task with complete set-up. 09/26/2022: Completes 90% of the task, proximal assist at times required at the elbow.  08/15/2022: Completes 90% of the task, proximal assist at times required at the elbow. 07/20/22: completes 90% of task, limited by shoulder flexion. 05/25/2022:  Pt. Is able to initiate and perform oral care for approximately 90%  of the task. Complete set-up required. Assi needed only for the very back teeth. 03/23/2022: Pt. Is able to initiate and perform oral care for approximately 75% of the task. Pt. Requires assist at proximally at the elbow for through oral care. 03/21/2022: Pt. Is able to initiate and perform oral care for approximately 75% of the task. Pt. Requires assist at proximally at the elbow for through oral care. Eval: Pt. is able to initiate using an electric toothbrush. Pt. requires assist for set-up, and assist for thoroughness, and as he Pt. fatigues. Goal status: Ongoing  3.  Pt. Will be modified independence with self-feeding for 100% using a swivel spoon, and standard fork after complete set-up Baseline: 02/13/23:  Pt. Is now able to consistently, and efficiently use a swivel spoon for eating cereal with the left hand.01/09/2023: Continue 12/28/2022: Pt. Is now feeding himself 90% of the time using a swivel spoon with the left hand, and 95% of the time with the fork. Pt. Continues to have difficulty with cutting food. 11/16/2022: 100% with finger foods, Pt. Is initiating with a swivel spoon, and universal cuff for fork use, however requires assist for consistency, and accuracy.  09/26/2022: 90% using the spoon, assist at time with the  forearm motion, and grip on the utensils. 100% for finger foods.  08/15/2022:  90% using the spoon, assit at time with the forearm motion, and grip on the utensils. 100% for finger foods-independent with set-up- unsupervised.  07/20/22: self-feeds cereal using spoon 90% of task. 05/25/2022: Pt. Is able to use a spoon to scoop cereal when feeding himself cereal 85% of the time. Pt. Is able to feed himself snack/finger foods 100% of the time. Pt. Continues to work on consistency of  stabilizing a cup/mug when drinking. Pt. Is able to grasp a water bottle with assist initially, with assist tapering off as he drinks.03/23/2022: Pt. Is able to perform scooping cereal for 75% of the time. Pt. required assist, and support at the left elbow, and Pt. Presents with limited forearm supination when using the spoon, and bringing it towards his mouth. Pt. Is able to use a fork to spear items, and perform the hand to mouth pattern.  03/21/2022: Pt. Is able to perform scooping cereal for 75% of the time. Pt. required assist, and support at the left elbow, and Pt. presents with limited forearm supination when using the spoon, and bringing it towards his mouth. Pt. Is able to use a fork to spear items, and perform the hand to mouth pattern.  Eval: Pt. is able to hold standard standard utensils. Pt. Performs as much of the task as he, can and has assistance for the remainder. Goal status:   Ongoing  4.  Pt. Will improve grasp patterns and consistently grasp 1/4" objects for ADL, and IADL tasks.  Baseline: 02/13/2023: 1/2" objects efficiently 01/09/2023: Continue 12/28/2022: Continues to grasp 1/4" pegs with min a + visual cues, consistently grasping 1/2" objects with visual cues 11/16/2022: Continues to grasp 1/4" pegs with min a + visual cues, consistently grasping 1/2" objects with visual cues 09/26/2022: Continues to grasp 1/4" pegs with min a + visual cues, consistently grasping 1/2" objects with visual cues. 08/15/2022: grasps 1/4"  pegs with min a + visual cues, consistently grasping 1/2" objects with visual cues. 07/20/22: grasps 1/4" pegs with min a + visual cues, consistently grasping 1/2" objects with visual cues. 05/25/2022: Pt. Is working on improving consistency of grasping 1/2" objects with visual cues.  03/23/2022: Pt. Is  able to grasp 1" objects consistently,and continues to work on the hand patterns needed to grasp 1/2" objects.03/21/2022: Pt. Is able to grasp 1" objects consistently,and continues to work on the hand patterns needed to grasp 1/2" objects. Eval: Pt. is able to grasp 1" objects intermittently using a lateral grasp pattern. Goal status: Ongoing  5.  Pt. will independently write his name legibly with letter sizes under 1". Baseline: 02/13/2023: Continue 01/09/2023: Continue. 12/28/2022: Pt. is now using an IPad Pen. 11/16/2022: Pt. Continues to be able to write name with smaller letter size. Pt. Is signing name with a computer styus. Pt. Requires visual cues, and assist 09/26/2022: Pt. Continues to be able to write name with smaller letter size. Pt. Is signing name with a computer styus. Pt. Requires visual cues, and assist. 08/15/2022: Pt. Is able to write name with smaller letter size. Pt. Is signing name with a computer styus. Pt. Requires visual cues, and assist. 07/20/22: stabilizing assist to write name with 75% legibility with 2" letters. 05/25/2022: Pt. to continue to work towards formulating grasp patterns in preparation for grasping a large width pen. Pt. Requires visual cues. 03/23/2022: Pt. Is able to write his name with modA, however has difficulty with formulating letter sizes less than 2" in size with 50% legibility for the 3 letters of his name.03/21/2022: Pt. Is able to write his name with modA, however has difficulty with formulating letter sizes less than 2" in size with 50% legibility for the 3 letters of his name. Eval: Pt is able to hold a thin marker with his left hand, and formulate a line, and  initiate a circular pattern (Pt. without glasses today) Goal status: Ongoing  6. Pt. Will reach up to comb/brush his hair with minA.  Baseline: 02/13/2023:Pt. is able to use a hair pick with the left hand on the right side of his head, top, and back of the head. Pt. Continues to have difficulty reaching further to the right side of his head with the left. 01/09/2023: Continue 12/28/2022: Pt. Is progressing, and is now able to use a hair pick with the left hand on the right side of his head, top, and back of the head. Pt. Continues to have difficulty reaching further to the right side of his head with the left. 11/16/2022: Pt. Is able to use a hair pick for the left side of his head using his left hand. Pt. Presents with difficulty reaching to the right side of his head.09/26/2022: Pt. Is able to use a hair pick for the left side of his head using his left hand. Pt. Presents with difficulty reaching to the right side of his head. 5/13/202: 75% using a hair pick, assist to the right side of the head. 07/20/22: reaches 75% of head, assist for far R side of head, fatigues quickly. 05/25/2022: Pt. Is able to reach up with the left hand to the left side, top, and back of his head. 03/23/2022: Pt. is now able to more consistently initiate reaching up to his head with his left hand in preparation for haircare12/18/2023: Pt. is now able to more consistently initiate reaching up to his head with his left hand in preparation for haircare. Eval: Pt. is able to initiate reaching up for hair care with a long handled brush, however is unable to sustain UE's in elevation to perform the task.     Goal status: Ongoing  7. Pt. Will independently navigate the w/c through his environment with minA with visual scanning,  and hand placement on the controls.  Baseline: 11/16/2022: Deferred 2/2 vision 09/26/2022: Pt. Is now navigating his w/c ouside the home, and around his block. 08/15/2022: Pt. Requires minA to setup hand on controls and  MIN cues to navigate the w/c in wide spaces, requires MIN - MOD cues to navigate the w/c through more narrow doorways, and tighter turns - varies based on good vs bad vision days.07/20/22: Pt. Requires minA to setup hand on controls and MIN cues to navigate the w/c in wide spaces, requires MIN - MOD cues to navigate the w/c through more narrow doorways, and tighter turns - varies based on good vs bad vision days. 05/25/2022: Pt. Requires minA  and  cues to navigate the w/c in wide spaces, and requires Mod cues to navigate the w/c through more narrow doorways, and tighter turns. Pt. Requires max cues for scanning through the environment, and moderate cues for hand placement on the controls.     Goal status: Deferred 2/2 vision    8. Pt. Will improve bilateral grip strength to be able to independently grasp, and pull up blankets, and linens.   Baseline: 02/13/2023: Continue. Pt. presents with digit PIP, and DIP flexor tightness. 01/09/2023: Continue 12/28/2023: R: 0#, L: 5# Pt. Is now able to more consistently grasp and pull blankets up over him. 11/16/2022: R: 0 L: 8# Pt. Is more consistently grasping his blankets and attempting to pull them up. 09/26/2022: R: 0  L: 8# Pt. is attempting to grasp, and pull blankets up more, using mostly the left hand. 08/15/2022: Pt. has difficulty securely holding, and pulling up blankets, and linens.    Goal status: Ongoing  9. Pt. will consistently actively control the releasing  of blankets, covers, and linens from his hands once they are in the desired position over him. Baseline:12/28/2022: Pt. Is consistently actively able to release linens once they are in the desired position over him. 11/16/2022: Pt. continues to improve with actively releasing blankets form his hands. 09/26/2022: Pt. is improving with actively releasing blankets form his hands. 08/15/2022: Pt. has difficulty with controlled releasing of blankets/linens from his grasp.     Goal status: Achieved    10. Pt.  Will improve bilateral UE strength by 2 MM grades to assist with ADLs, and IADLs  Baseline: 02/13/2023: Continue 01/09/2023: Continue 12/28/2022: Right: shoulder flexion: 3/5, abduction: 4/5, elbow flexion: 4+/5 , extension: 4-/5 wrist extension: 2/5; Left: shoulder flexion: 4+/5, abduction: 4+/5, elbow flexion: 5/5 , extension: 4/5  wrist extension: 3/5 Right: shoulder flexion: 3+/5, abduction: 4/5, elbow flexion: 4+/5 , extension: 4+/5 wrist extension: 2/5; Left: shoulder flexion: 4+/5, abduction: 4+/5, elbow flexion: 5/5 , extension: 4+/5 wrist extension: 3-/5 11/16/2022: Right: shoulder flexion: 3/5, abduction: 4/5, elbow flexion: 4+/5 , extension: 4+/5 wrist extension: 2/5; Left: shoulder flexion: 4+/5, abduction: 4+/5, elbow flexion: 5/5 , extension:  wrist extension: 3-/5 Right: shoulder flexion: 3+/5, abduction: 4/5, elbow flexion: 4+/5 , extension: 4+/5 wrist extension: 2/5; Left: shoulder flexion: 4+/5, abduction: 4+/5, elbow flexion: 5/5 , extension: 4+/5 wrist extension: 3-/5 Goal status: Ongoing  11. Pt. Will improve right wrist extension by 10 degrees to initiate reaching for items in preparation for ADLs. Baseline: 02/13/2023: Continue 01/09/2023: Continue 12/28/2022: -22(34) 11/16/2022: -30(10)    Goal status: Progressing   ASSESSMENT:  CLINICAL IMPRESSION:  Pt. continues to tolerate moist heat and stretching well. Pt. required cues and assist for hand placement on the dowel, as well as back-up support/stabilization of the bar. Pt. requires cues for hand  placement into position on the gripper. Pt.continues to present with digit PIP, and DIP flexor tightness. Pt. continues to be highly motivated to continue working towards improving BUE functioning, ROM, strength, motor control, [facility] skills, as well as visual compensatory strategies in order to maximize independence with daily ADLs, and IADL functioning.      PERFORMANCE DEFICITS in functional skills including ADLs, IADLs, coordination,  dexterity, proprioception, ROM, strength, pain, FMC, GMC, decreased knowledge of use of DME, and UE functional use, cognitive skills including safety awareness, and psychosocial skills including coping strategies, environmental adaptation, habits, and routines and behaviors.   IMPAIRMENTS are limiting patient from ADLs, IADLs, leisure, and social participation.   COMORBIDITIES may have co-morbidities  that affects occupational performance. Patient will benefit from skilled OT to address above impairments and improve overall function.  MODIFICATION OR ASSISTANCE TO COMPLETE EVALUATION: Maximum or significant modification of tasks or assist is necessary to complete an evaluation.  OT OCCUPATIONAL PROFILE AND HISTORY: Comprehensive assessment: Review of records and extensive additional review of physical, cognitive, psychosocial history related to current functional performance.  CLINICAL DECISION MAKING: High - multiple treatment options, significant modification of task necessary  REHAB POTENTIAL: Fair    EVALUATION COMPLEXITY: High    PLAN: OT FREQUENCY: 2 x's a week  OT DURATION:12 weeks  PLANNED INTERVENTIONS: self care/ADL training, therapeutic exercise, therapeutic activity, neuromuscular re-education, manual therapy, passive range of motion, patient/family education, and cognitive remediation/compensation RECOMMENDED OTHER SERVICES: PT  CONSULTED AND AGREED WITH PLAN OF CARE: Patient and family member/caregiver  PLAN FOR NEXT SESSION: Initiate treatment  Olegario Messier, MS, OTR/L   02/20/23, 5:03 PM

## 2023-02-20 NOTE — Therapy (Addendum)
OUTPATIENT PHYSICAL THERAPY TREATMENT/RECERT VISIT    Patient Name: Paul Hall, 27 y.o., Paul Hall Today's Date: 02/21/2023  PCP:  Cindi Carbon PROVIDER:  Lenise Herald, PA-C   PT End of Session - 02/20/23 1528     Visit Number 136    Number of Visits 160   Date for PT Re-Evaluation 05/15/2023   Authorization Type [employer] COMM [employer]/ [place] [employer]    Authorization Time Period 01/04/21-03/29/21; Recert 03/24/2021-06/16/2021; Recert 09/15/2021- 12/08/2021; Recert 12/13/2021-03/07/2022    Progress Note Due on Visit 140    PT Start Time 1616    PT Stop Time 1700    PT Time Calculation (min) 44 min    Equipment Utilized During Treatment Gait belt    Activity Tolerance Patient tolerated treatment well    Behavior During Therapy WFL for tasks assessed/performed                      Past Medical History:  Diagnosis Date   Diabetes mellitus (HCC)    Hypertension    Stroke (HCC)    Past Surgical History:  Procedure Laterality Date   IVC FILTER PLACEMENT ([facility] HX)     LEG SURGERY     PEG TUBE PLACEMENT     TRACHEOSTOMY     Patient Active Problem List   Diagnosis Date Noted   Sepsis due to vancomycin resistant Enterococcus species (HCC) 06/06/2019   SIRS (systemic inflammatory response syndrome) (HCC) 06/05/2019   Acute lower UTI 06/05/2019   VRE (vancomycin-resistant Enterococci) infection 06/05/2019   Anemia 06/05/2019   Skin ulcer of sacrum with necrosis of muscle (HCC)    Urinary retention    Type 2 diabetes mellitus without complication, with long-term current use of insulin (HCC)    Tachycardia    Lower extremity edema    Acute metabolic encephalopathy    Obstructive sleep apnea    Morbid obesity with BMI of 60.0-69.9, adult (HCC)    Goals of care, counseling/discussion    Palliative care encounter    Sepsis (HCC) 04/27/2019   H/O insulin dependent diabetes mellitus 04/27/2019   History of CVA with residual deficit 04/27/2019    Seizure disorder (HCC) 04/27/2019   Decubitus ulcer of sacral region, stage 4 (HCC) 04/27/2019    REFERRING DIAG: Cerebral infarction, unspecified   THERAPY DIAG:  Muscle weakness (generalized)  Other lack of coordination  Difficulty in walking, not elsewhere classified  Unsteadiness on feet  Abnormality of gait and mobility  Other abnormalities of gait and mobility  Rationale for Evaluation and Treatment Rehabilitation  PERTINENT HISTORY: Oluwaseun Guadron is a 26yoM who presents with severe weakness, quadriparesis, altered sensorium, and visual impairment s/p critical illness and prolonged hospitalization. Pt hospitalized in October 2020 with ARDS 2/2 COVID19 infection. Pt sustained a complex and lengthy hospitalization which included tracheostomy, prolonged sedation, ECMO. In this period pt sustained CVA and SDH. Pt has now been liberated from tracheostomy and G-tube. Pt has since been hospitalized for wound infection and UTI. Pt lives with parents at home, has hospital bed and left chair, hoyer lift transfers, and power WC for mobility needs. Pt needs heavy physical assistance with ADL 2/2 BUE contractures and motor dysfunction   PRECAUTIONS: Fall  SUBJECTIVE:  Pt reported feeling well overall without any issues today.     PAIN:  Are you having pain? No   TODAY'S TREATMENT:  - 02/21/23    *Dependent transfer from w/c to edge of mat using hoyer lift (+2 person  assist)  Therapeutic Exercises: (at edge of mat)  Lateral Trunk Leans x 10 each side Forward/Backward Trunk Leans x 10 each direction LAQ x 8 each LE  Therapeutic activities: (at edge of mat)  Static standing trials x 2 - All less than 1 min each (max effort from patient and max assist +2 to remain in standing). Patient exhibited difficulty with standing erect and on second trial - he was able to accept feedback to engage glutes better and look ahead for his best quality stand. He required increased rest break as he  was straining hard with standing and can become dizzy at times- None today however.   PATIENT EDUCATION: Education details: Exercise technique with VC   HOME EXERCISE PROGRAM:  Access Code: [email or web address] URL: [email or web address] Date: 03/23/2022 Prepared by: Maureen Ralphs  Exercises - Supine Bridge  - 3 x weekly - 3 sets - 10 reps - 2 hold - Supine Gluteal Sets  - 3 x weekly - 3 sets - 10 reps - 5 sec hold - Supine Quad Set  - 1 x daily - 3 x weekly - 3 sets - 10 reps - 5 hold - Seated Long Arc Quad  - 1 x daily - 7 x weekly - 3 sets - 10 reps - Seated Hip Adduction Squeeze with Ball  - 1 x daily - 3 x weekly - 3 sets - 10 reps - 5 hold - Seated Hip Abduction  - 1 x daily - 3 x weekly - 3 sets - 10 reps - 2 hold    PT Short Term Goals -       PT SHORT TERM GOAL #1   Title Pt will be independent with HEP in order to improve strength and balance in order to decrease fall risk and improve function at home and work.    Baseline 01/04/2021= No formal HEP in place; 12/12 no HEP in place; 05/10/2021-Patient and his father were able to report compliance with curent HEP consisting of mostly seated/reclined LE strengthening. Both verbalize no questions at this time.    Time 6    Period Weeks    Status Achieved    Target Date 02/15/21            PT LONG TERM GOAL #1     Title Patient will increase BLE gross strength by 1/2 muscle grade to improve functional strength for improved independence with potential gait, increased standing tolerance and increased ADL ability.     Baseline 01/04/2021- Patient presents with 1/5 to 3-/5 B LE strength with MMT; 12/12: goal partially met for Left knee/hip; 05/10/2021= 2-/5 bilateal Hip flex; 3+/5 bilateral Knee ext; 06/21/2021= Patient presents with 2-/5 bilateral Hip flex; 3+/5 bilateral knee ext/flex; 2-/5 left ankle DF; 0/5 right ankle- and able to increase reps and resistance with LE's. 09/15/2021- Patient technically presents with 2-/5 B  hip flex/abd/add - but he is able to raise his hip up to approx 100 deg which has improved. 3+/5 Bilateral knee ext, 2-/5 left ankle and 0/5 right ankle.  12/08/2021= Patient able to lift left knee at 110 deg of hip flex; presents with 3+/5 knee ext, 2-/5 left ankle DF and 0/5 right ankle DF, 2-/5 bilateral Hip abd in seated position.    12/6: R: knee 3+/5 ext, 2/5 flexion, left knee 3+/5 extension, 3+/5 flexion, R hip: 2+/5 hip add, 2+/5 hip ABD L hip: 4-/5 hip ABD, 3+/5 hip ADD, 3+/5 hip flexion; 06/06/2022= Patient now presents with 2-/5 right ankle DF/PF;  Time 12     Period Weeks     Status MET    Target Date 03/07/2022         PT LONG TERM GOAL #2    Title Patient will tolerate sitting unsupported demonstrating erect sitting posture for 15 minutes with CGA to demonstrate improved back extensor strength and improved sitting tolerance.     Baseline 01/04/2021- Patient confied to sitting in lift chair or electric power chair with back support and unable to sit upright without physical assistance; 12/12: tolerates <1 minutes upright unsupported sitting. 05/10/2021=static sit with forward trunk lean  in his power wheelchair without back support x approx 3 min. 06/21/2021=Unable to assess today due to patient with acute back pain but on previous visit able to sit x 8 min without back support. 09/15/2021- on last visit- 09/13/2021- patient was able to sit unsupported x 8 min at edge of mat. 10/13/2023 - Patient was able to sit at edge of mat with varying level of assist today from SBA to min A for a total of 20 min. 12/13/2021= Patient demonstrated unsupported sitting at edge of mat for approx 20 min    Time 12     Period Weeks     Status GOAL MET    Target Date 12/08/21          PT LONG TERM GOAL #3    Title Patient will demonstrate ability to perform static standing in // bars > 2 min with Max Assist  without loss of balance and fair posture for improved overall strength for pre-gait and transfer  activities.     Baseline 01/04/2021= Patient current uanble to stand- Dependent on hoyer or sit to stand lift for transfers. 05/10/2021=Not appropriate yet- Currently still dependent with all transfers using hoyer. 06/21/2021= Patient continuing now to focus on LE strengthening to prepare for standing-unable to try today due to acute low back pain-  planning on attempting in new cert period. 09/15/2021- Patient has attempted standing 2x in past two week- max Assist of 2 people - only once was he successful to clearing his bottom from chair - Will continue to be a focus during the new certification. 12/13/2021= Patient has been limited secondary to increased overall low back pain during this certification and will require more time to focus on this goal.  12/6: not assessed this date, will assess at date when 2-3 PTs are present for assistance     Time 12     Period Weeks     Status GOAL not appropriate at this time - may attempt in future once Patient presents with improved overall LE strength.                 PT LONG TERM GOAL #4    Title Pt will improve FOTO score by 10 points or more demonstrating improved perceived functional ability     Baseline FOTO 7 on 10/17; 03/15/21: FOTO 12; 05/10/2021 06/21/2021= 1; 09/15/2021= 9; 12/13/2021= Will issue next visit 12/6: 4; 06/06/2022= Will assess next visit; Goal not appropriate as patient is not ambulatory.     Time 12     Period Weeks     Status WITHDRAWN    Target date 11/30/2022         PT LONG TERM GOAL #5    Title Patient will perform sit to stand transfer with appropriate AD and max assist of 2 people with 75% consistency to prepare for pregait activities.     Baseline 09/15/2021=  Patient unable to stand well- unable to clear his bottom off chair with Max assist of 2 persons. 12/13/2021- Goal not appropriate to try yet but will keep and roll over to next cert as shift continues to focus on transfers/standing; 4/24= Patient able to perform active ankle DF/PF with  right LE and able to raise his knee into seated march and clear floor without physical assist today - as previously unable as well as lift right knee ext to near full ROM to improve strength for eventual standing. 09/07/2022- Goal continues to not be appropriate but patient is now standing some and will keep goal active; 11/23/2022= patient is now performing sit to stand with max A +2 and now able to clear his bottom off mat. 11/30/2022- Patient continues to perform sit to stand transfers with Max A+2 and able to clear bottom well off mat but not erect yet; 02/20/2023= Static standing trials x 2 - All less than 1 min each (max effort from patient and max assist +2 to remain in standing). Patient exhibited difficulty with standing erect and on second trial - he was able to accept feedback to engage glutes better and look ahead for his best quality stand    Time 12     Period Weeks     Status PROGRESSING    Target date  05/14/2022       PT LONG TERM GOAL #6  Title Patient will tolerate sitting unsupported demonstrating erect sitting posture for 30 minutes with CGA to demonstrate improved back extensor strength and improved sitting tolerance.   Baseline 12/13/2021= Patient demonstrated unsupported sitting at edge of mat for approx 20 min;  12/29/2021- Patient performed approx 30 min of dynamic sitting activities today. 06/06/2022= Patient demonstrated ability to sit and perform static and dynamic UE/LE movement with only Supervision.   Time 12   Period Weeks   Status GOAL MET           7.  Patient will tolerate 2 minutes or more of standing in sit to stand lift or with max assist +2 in order to indicate improved lower extremity weightbearing tolerance for progression to standing in parallel bars. Baseline: 1 minute on most recent stand 02/21/22; 06/06/2022- Patient did attempt today after complaining of right LE pain last week. He attempted 3 stands using sit to stand lift. - all over 1 min- last one approx  48 sec- stopped due to fatigue. More erect standing in lift today - still poor gluteal strength but able to activate glutes and extend hips upon command but unable to hold > 5 sec. 07/28/2022- Will attempt standing next visit but patient has been able to stand since last progress note for up to 3 min at a time at his best. 09/07/2022-  attempted standing with max +2 and patient able to stand 15-20 sec so will now  revise goal; 11/23/2022- Patient at his best between last 2 visits was able to stand approx 50 sec with max A+2.  11/30/2022- Patient performed 4 trials of static standing - max A +2- clearing bottom and using forearms to bear weight 02/20/2023= Static standing trials x 2 - All less than 1 min each (max effort from patient and max assist +2 to remain in standing). Patient exhibited difficulty with standing erect and on second trial - he was able to accept feedback to engage glutes better and look ahead for his best quality stand Goal status: PROGRESSING Target date: 05/14/2022   8. Patient will tolerate  sitting unsupported demonstrating ability to perform dynamic UE/LE activities for 30 minutes  independently to demonstrate ability to sit at edge of bed to eat or perform some ADL's/exercise for optimal quality of life.             Baseline: 06/06/2022- Patient able to sit and perform some UE/LE exercises but requires CGA at times for safety- he is able to static sit for 30 min with supervision. 07/27/2022= Patient able to now sit > 30 min with Supervision only - performing static and dynamic activities. Will keep goal active to ensure patient is consistent. 09/07/2022=  Patient able to demonstrate consistent ability to sit at edge of mat during visits             Goal Status: MET             Target date: 08/29/2022        Plan     Clinical Impression Statement Patient demonstrated great effort today. Pt was motivated to stand after completing seated therex. Pt stated that his static standing trials felt  "really good." Pt felt accomplished after today's visit. Pt will continue to benefit from skilled physical therapy intervention to address impairments, improve QOL, and attain therapy goals.    Personal Factors and Comorbidities Comorbidity 3+;Time since onset of injury/illness/exacerbation    Comorbidities CVA, diabetes, Seizures    Examination-Activity Limitations Bathing;Bed Mobility;Bend;Caring for Others;Carry;Dressing;Hygiene/Grooming;Lift;Locomotion Level;Reach Overhead;Self Feeding;Sit;Squat;Stairs;Stand;Transfers;Toileting    Examination-Participation Restrictions Cleaning;Community Activity;Driving;Laundry;Medication Management;Meal Prep;Occupation;Personal Finances;Shop;Yard Work;Volunteer    Stability/Clinical Decision Making Evolving/Moderate complexity    Rehab Potential Fair    PT Frequency 2x / week    PT Duration 12 weeks    PT Treatment/Interventions ADLs/Self Care Home Management;Cryotherapy;Electrical Stimulation;Moist Heat;Ultrasound;DME Instruction;Gait training;Stair training;Functional mobility training;Therapeutic exercise;Balance training;Patient/family education;Orthotic Fit/Training;Neuromuscular re-education;Wheelchair mobility training;Manual techniques;Passive range of motion;Dry needling;Energy conservation;Taping;Visual/perceptual remediation/compensation;Joint Manipulations    PT Next Visit Plan core strength/motor control in short-sitting, sit to stand if able; Continue with progressive LE Strengthening. Stand activities as appropriate.     PT Home Exercise Plan No changes to HEP today    Consulted and Agree with Plan of Care Patient;Family member/caregiver    Family Member Consulted            8:15 AM, 02/21/23 Louis Meckel, PT Physical Therapist - [place] [facility]  Outpatient Physical Therapy- Main [facility] [phone]    Debara Pickett, SPT  This entire session was performed under direct supervision and direction of a  licensed therapist/therapist assistant . I have personally read, edited and approve of the note as written.    02/21/2023, 8:15 AM

## 2023-02-21 DIAGNOSIS — R32 Unspecified urinary incontinence: Secondary | ICD-10-CM | POA: Diagnosis not present

## 2023-02-21 DIAGNOSIS — L8994 Pressure ulcer of unspecified site, stage 4: Secondary | ICD-10-CM | POA: Diagnosis not present

## 2023-02-21 DIAGNOSIS — I6389 Other cerebral infarction: Secondary | ICD-10-CM | POA: Diagnosis not present

## 2023-02-22 ENCOUNTER — Ambulatory Visit: Payer: Medicare Other | Admitting: Occupational Therapy

## 2023-02-22 ENCOUNTER — Ambulatory Visit: Payer: BC Managed Care – PPO

## 2023-02-22 DIAGNOSIS — G4733 Obstructive sleep apnea (adult) (pediatric): Secondary | ICD-10-CM | POA: Diagnosis not present

## 2023-02-27 ENCOUNTER — Ambulatory Visit: Payer: BC Managed Care – PPO

## 2023-02-27 ENCOUNTER — Ambulatory Visit: Payer: BC Managed Care – PPO | Admitting: Occupational Therapy

## 2023-03-01 ENCOUNTER — Ambulatory Visit: Payer: BC Managed Care – PPO

## 2023-03-01 ENCOUNTER — Ambulatory Visit: Payer: BC Managed Care – PPO | Admitting: Occupational Therapy

## 2023-03-01 DIAGNOSIS — G40909 Epilepsy, unspecified, not intractable, without status epilepticus: Secondary | ICD-10-CM | POA: Diagnosis not present

## 2023-03-01 DIAGNOSIS — G8250 Quadriplegia, unspecified: Secondary | ICD-10-CM | POA: Diagnosis not present

## 2023-03-01 DIAGNOSIS — I69365 Other paralytic syndrome following cerebral infarction, bilateral: Secondary | ICD-10-CM | POA: Diagnosis not present

## 2023-03-06 ENCOUNTER — Encounter: Payer: Self-pay | Admitting: Occupational Therapy

## 2023-03-06 ENCOUNTER — Ambulatory Visit: Payer: BC Managed Care – PPO | Admitting: Occupational Therapy

## 2023-03-06 ENCOUNTER — Ambulatory Visit: Payer: BC Managed Care – PPO | Attending: Physician Assistant

## 2023-03-06 DIAGNOSIS — R278 Other lack of coordination: Secondary | ICD-10-CM | POA: Diagnosis not present

## 2023-03-06 DIAGNOSIS — R2689 Other abnormalities of gait and mobility: Secondary | ICD-10-CM | POA: Insufficient documentation

## 2023-03-06 DIAGNOSIS — I6930 Unspecified sequelae of cerebral infarction: Secondary | ICD-10-CM | POA: Diagnosis not present

## 2023-03-06 DIAGNOSIS — R2681 Unsteadiness on feet: Secondary | ICD-10-CM | POA: Insufficient documentation

## 2023-03-06 DIAGNOSIS — R262 Difficulty in walking, not elsewhere classified: Secondary | ICD-10-CM | POA: Insufficient documentation

## 2023-03-06 DIAGNOSIS — M6281 Muscle weakness (generalized): Secondary | ICD-10-CM

## 2023-03-06 DIAGNOSIS — R269 Unspecified abnormalities of gait and mobility: Secondary | ICD-10-CM | POA: Diagnosis not present

## 2023-03-06 NOTE — Therapy (Signed)
OUTPATIENT PHYSICAL THERAPY TREATMENT     Patient Name: Paul Hall MRN: [id number] DOB:05-Nov-1995, 27 y.o., male Today's Date: 03/06/2023  PCP:  Cindi Carbon PROVIDER:  Lenise Herald, PA-C   PT End of Session - 03/06/23 1532     Visit Number 137    Number of Visits 146    Date for PT Re-Evaluation 02/22/23    Authorization Type [employer]/ [place] Medicaid    Authorization Time Period 01/04/21-03/29/21; Recert 03/24/2021-06/16/2021; Recert 09/15/2021- 12/08/2021; Recert 12/13/2021-03/07/2022    Progress Note Due on Visit 140    PT Start Time 1533    PT Stop Time 1613    PT Time Calculation (min) 40 min    Equipment Utilized During Treatment Gait belt    Activity Tolerance Patient tolerated treatment well    Behavior During Therapy WFL for tasks assessed/performed                     Past Medical History:  Diagnosis Date   Diabetes mellitus (HCC)    Hypertension    Stroke (HCC)    Past Surgical History:  Procedure Laterality Date   IVC FILTER PLACEMENT ([facility] HX)     LEG SURGERY     PEG TUBE PLACEMENT     TRACHEOSTOMY     Patient Active Problem List   Diagnosis Date Noted   Sepsis due to vancomycin resistant Enterococcus species (HCC) 06/06/2019   SIRS (systemic inflammatory response syndrome) (HCC) 06/05/2019   Acute lower UTI 06/05/2019   VRE (vancomycin-resistant Enterococci) infection 06/05/2019   Anemia 06/05/2019   Skin ulcer of sacrum with necrosis of muscle (HCC)    Urinary retention    Type 2 diabetes mellitus without complication, with long-term current use of insulin (HCC)    Tachycardia    Lower extremity edema    Acute metabolic encephalopathy    Obstructive sleep apnea    Morbid obesity with BMI of 60.0-69.9, adult (HCC)    Goals of care, counseling/discussion    Palliative care encounter    Sepsis (HCC) 04/27/2019   H/O insulin dependent diabetes mellitus 04/27/2019   History of CVA with residual deficit 04/27/2019   Seizure  disorder (HCC) 04/27/2019   Decubitus ulcer of sacral region, stage 4 (HCC) 04/27/2019    REFERRING DIAG: Cerebral infarction, unspecified   THERAPY DIAG:  Muscle weakness (generalized)  Other lack of coordination  Difficulty in walking, not elsewhere classified  Unsteadiness on feet  Abnormality of gait and mobility  Other abnormalities of gait and mobility  Rationale for Evaluation and Treatment Rehabilitation  PERTINENT HISTORY: Paul Hall is a 26yoM who presents with severe weakness, quadriparesis, altered sensorium, and visual impairment s/p critical illness and prolonged hospitalization. Pt hospitalized in October 2020 with ARDS 2/2 COVID19 infection. Pt sustained a complex and lengthy hospitalization which included tracheostomy, prolonged sedation, ECMO. In this period pt sustained CVA and SDH. Pt has now been liberated from tracheostomy and G-tube. Pt has since been hospitalized for wound infection and UTI. Pt lives with parents at home, has hospital bed and left chair, hoyer lift transfers, and power WC for mobility needs. Pt needs heavy physical assistance with ADL 2/2 BUE contractures and motor dysfunction   PRECAUTIONS: Fall  SUBJECTIVE:  Pt reported feeling well overall without any issues today.     PAIN:  Are you having pain? No   TODAY'S TREATMENT:  - 03/06/23    *Dependent transfer from w/c to edge of mat using hoyer lift (+2  person assist)  Therapeutic Exercises: (at edge of mat)  Lateral Trunk Leans x 10 each side Forward/Backward Trunk Leans x 20 each direction Sitting Marches x 10 each LE  Therapeutic activities: (at edge of mat)  Static standing trials x 3 - All less than 1 min each (max effort from patient and max assist +2 to remain in standing). Patient exhibited difficulty with standing erect and on second trial - he was able to accept feedback to engage glutes better and look ahead for his best quality stand. He required increased rest break  as he was straining hard with standing and can become dizzy at times- None today however.   PATIENT EDUCATION: Education details: Exercise technique with VC   HOME EXERCISE PROGRAM:  Access Code: [email or web address] URL: [email or web address] Date: 03/23/2022 Prepared by: Maureen Ralphs  Exercises - Supine Bridge  - 3 x weekly - 3 sets - 10 reps - 2 hold - Supine Gluteal Sets  - 3 x weekly - 3 sets - 10 reps - 5 sec hold - Supine Quad Set  - 1 x daily - 3 x weekly - 3 sets - 10 reps - 5 hold - Seated Long Arc Quad  - 1 x daily - 7 x weekly - 3 sets - 10 reps - Seated Hip Adduction Squeeze with Ball  - 1 x daily - 3 x weekly - 3 sets - 10 reps - 5 hold - Seated Hip Abduction  - 1 x daily - 3 x weekly - 3 sets - 10 reps - 2 hold    PT Short Term Goals -       PT SHORT TERM GOAL #1   Title Pt will be independent with HEP in order to improve strength and balance in order to decrease fall risk and improve function at home and work.    Baseline 01/04/2021= No formal HEP in place; 12/12 no HEP in place; 05/10/2021-Patient and his father were able to report compliance with curent HEP consisting of mostly seated/reclined LE strengthening. Both verbalize no questions at this time.    Time 6    Period Weeks    Status Achieved    Target Date 02/15/21            PT LONG TERM GOAL #1     Title Patient will increase BLE gross strength by 1/2 muscle grade to improve functional strength for improved independence with potential gait, increased standing tolerance and increased ADL ability.     Baseline 01/04/2021- Patient presents with 1/5 to 3-/5 B LE strength with MMT; 12/12: goal partially met for Left knee/hip; 05/10/2021= 2-/5 bilateal Hip flex; 3+/5 bilateral Knee ext; 06/21/2021= Patient presents with 2-/5 bilateral Hip flex; 3+/5 bilateral knee ext/flex; 2-/5 left ankle DF; 0/5 right ankle- and able to increase reps and resistance with LE's. 09/15/2021- Patient technically presents with  2-/5 B hip flex/abd/add - but he is able to raise his hip up to approx 100 deg which has improved. 3+/5 Bilateral knee ext, 2-/5 left ankle and 0/5 right ankle.  12/08/2021= Patient able to lift left knee at 110 deg of hip flex; presents with 3+/5 knee ext, 2-/5 left ankle DF and 0/5 right ankle DF, 2-/5 bilateral Hip abd in seated position.    12/6: R: knee 3+/5 ext, 2/5 flexion, left knee 3+/5 extension, 3+/5 flexion, R hip: 2+/5 hip add, 2+/5 hip ABD L hip: 4-/5 hip ABD, 3+/5 hip ADD, 3+/5 hip flexion; 06/06/2022= Patient now presents with 2-/5 right  ankle DF/PF;     Time 12     Period Weeks     Status MET    Target Date 03/07/2022         PT LONG TERM GOAL #2    Title Patient will tolerate sitting unsupported demonstrating erect sitting posture for 15 minutes with CGA to demonstrate improved back extensor strength and improved sitting tolerance.     Baseline 01/04/2021- Patient confied to sitting in lift chair or electric power chair with back support and unable to sit upright without physical assistance; 12/12: tolerates <1 minutes upright unsupported sitting. 05/10/2021=static sit with forward trunk lean  in his power wheelchair without back support x approx 3 min. 06/21/2021=Unable to assess today due to patient with acute back pain but on previous visit able to sit x 8 min without back support. 09/15/2021- on last visit- 09/13/2021- patient was able to sit unsupported x 8 min at edge of mat. 10/13/2023 - Patient was able to sit at edge of mat with varying level of assist today from SBA to min A for a total of 20 min. 12/13/2021= Patient demonstrated unsupported sitting at edge of mat for approx 20 min    Time 12     Period Weeks     Status GOAL MET    Target Date 12/08/21          PT LONG TERM GOAL #3    Title Patient will demonstrate ability to perform static standing in // bars > 2 min with Max Assist  without loss of balance and fair posture for improved overall strength for pre-gait and transfer  activities.     Baseline 01/04/2021= Patient current uanble to stand- Dependent on hoyer or sit to stand lift for transfers. 05/10/2021=Not appropriate yet- Currently still dependent with all transfers using hoyer. 06/21/2021= Patient continuing now to focus on LE strengthening to prepare for standing-unable to try today due to acute low back pain-  planning on attempting in new cert period. 09/15/2021- Patient has attempted standing 2x in past two week- max Assist of 2 people - only once was he successful to clearing his bottom from chair - Will continue to be a focus during the new certification. 12/13/2021= Patient has been limited secondary to increased overall low back pain during this certification and will require more time to focus on this goal.  12/6: not assessed this date, will assess at date when 2-3 PTs are present for assistance     Time 12     Period Weeks     Status GOAL not appropriate at this time - may attempt in future once Patient presents with improved overall LE strength.                 PT LONG TERM GOAL #4    Title Pt will improve FOTO score by 10 points or more demonstrating improved perceived functional ability     Baseline FOTO 7 on 10/17; 03/15/21: FOTO 12; 05/10/2021 06/21/2021= 1; 09/15/2021= 9; 12/13/2021= Will issue next visit 12/6: 4; 06/06/2022= Will assess next visit; Goal not appropriate as patient is not ambulatory.     Time 12     Period Weeks     Status WITHDRAWN    Target date 11/30/2022         PT LONG TERM GOAL #5    Title Patient will perform sit to stand transfer with appropriate AD and max assist of 2 people with 75% consistency to prepare for pregait activities.  Baseline 09/15/2021= Patient unable to stand well- unable to clear his bottom off chair with Max assist of 2 persons. 12/13/2021- Goal not appropriate to try yet but will keep and roll over to next cert as shift continues to focus on transfers/standing; 4/24= Patient able to perform active ankle DF/PF with  right LE and able to raise his knee into seated march and clear floor without physical assist today - as previously unable as well as lift right knee ext to near full ROM to improve strength for eventual standing. 09/07/2022- Goal continues to not be appropriate but patient is now standing some and will keep goal active; 11/23/2022= patient is now performing sit to stand with max A +2 and now able to clear his bottom off mat. 11/30/2022- Patient continues to perform sit to stand transfers with Max A+2 and able to clear bottom well off mat but not erect yet-     Time 12     Period Weeks     Status PROGRESSING    Target date  02/22/2023       PT LONG TERM GOAL #6  Title Patient will tolerate sitting unsupported demonstrating erect sitting posture for 30 minutes with CGA to demonstrate improved back extensor strength and improved sitting tolerance.   Baseline 12/13/2021= Patient demonstrated unsupported sitting at edge of mat for approx 20 min;  12/29/2021- Patient performed approx 30 min of dynamic sitting activities today. 06/06/2022= Patient demonstrated ability to sit and perform static and dynamic UE/LE movement with only Supervision.   Time 12   Period Weeks   Status GOAL MET           7.  Patient will tolerate 2 minutes or more of standing in sit to stand lift or with max assist +2 in order to indicate improved lower extremity weightbearing tolerance for progression to standing in parallel bars. Baseline: 1 minute on most recent stand 02/21/22; 06/06/2022- Patient did attempt today after complaining of right LE pain last week. He attempted 3 stands using sit to stand lift. - all over 1 min- last one approx 48 sec- stopped due to fatigue. More erect standing in lift today - still poor gluteal strength but able to activate glutes and extend hips upon command but unable to hold > 5 sec. 07/28/2022- Will attempt standing next visit but patient has been able to stand since last progress note for up to 3  min at a time at his best. 09/07/2022-  attempted standing with max +2 and patient able to stand 15-20 sec so will now  revise goal; 11/23/2022- Patient at his best between last 2 visits was able to stand approx 50 sec with max A+2.  11/30/2022- Patient performed 4 trials of static standing - max A +2- clearing bottom and using forearms to bear weight  Goal status: Revised Target date: 02/22/2023   8. Patient will tolerate sitting unsupported demonstrating ability to perform dynamic UE/LE activities for 30 minutes  independently to demonstrate ability to sit at edge of bed to eat or perform some ADL's/exercise for optimal quality of life.             Baseline: 06/06/2022- Patient able to sit and perform some UE/LE exercises but requires CGA at times for safety- he is able to static sit for 30 min with supervision. 07/27/2022= Patient able to now sit > 30 min with Supervision only - performing static and dynamic activities. Will keep goal active to ensure patient is consistent. 09/07/2022=  Patient able to demonstrate consistent ability to sit at edge of mat during visits             Goal Status: MET             Target date: 08/29/2022        Plan     Clinical Impression Statement Patient demonstrated great effort today. Pt was motivated to stand after completing seated therex. Pt stated that his static standing trials felt "good." Pt felt accomplished after today's visit. Pt will continue to benefit from skilled physical therapy intervention to address impairments, improve QOL, and attain therapy goals.    Personal Factors and Comorbidities Comorbidity 3+;Time since onset of injury/illness/exacerbation    Comorbidities CVA, diabetes, Seizures    Examination-Activity Limitations Bathing;Bed Mobility;Bend;Caring for Others;Carry;Dressing;Hygiene/Grooming;Lift;Locomotion Level;Reach Overhead;Self Feeding;Sit;Squat;Stairs;Stand;Transfers;Toileting    Examination-Participation Restrictions Cleaning;Community  Activity;Driving;Laundry;Medication Management;Meal Prep;Occupation;Personal Finances;Shop;Yard Work;Volunteer    Stability/Clinical Decision Making Evolving/Moderate complexity    Rehab Potential Fair    PT Frequency 2x / week    PT Duration 12 weeks    PT Treatment/Interventions ADLs/Self Care Home Management;Cryotherapy;Electrical Stimulation;Moist Heat;Ultrasound;DME Instruction;Gait training;Stair training;Functional mobility training;Therapeutic exercise;Balance training;Patient/family education;Orthotic Fit/Training;Neuromuscular re-education;Wheelchair mobility training;Manual techniques;Passive range of motion;Dry needling;Energy conservation;Taping;Visual/perceptual remediation/compensation;Joint Manipulations    PT Next Visit Plan core strength/motor control in short-sitting, sit to stand if able; Continue with progressive LE Strengthening. Stand activities as appropriate.     PT Home Exercise Plan No changes to HEP today    Consulted and Agree with Plan of Care Patient;Family member/caregiver    Family Member Consulted            5:06 PM, 03/06/23 Louis Meckel, PT Physical Therapist - [facility] [phone]    Debara Pickett, SPT  This entire session was performed under direct supervision and direction of a licensed therapist/therapist assistant . I have personally read, edited and approve of the note as written.    03/06/2023, 5:06 PM

## 2023-03-06 NOTE — Therapy (Signed)
Occupational Therapy Neuro Treatment Note  Patient Name: Paul Hall MRN: [id number] DOB:10-14-1995, 27 y.o., male Today's Date: 03/06/2023  PCP: Dr. Sherwood Gambler REFERRING PROVIDER: Dr. Sherwood Gambler   OT End of Session - 03/06/23 1559     Visit Number [id number]    Number of Visits 84    Date for OT Re-Evaluation 05/08/23    Authorization Type 09/26/2022    OT Start Time 1615    OT Stop Time 1700    OT Time Calculation (min) 45 min    Equipment Utilized During Treatment tilt in space power wc    Activity Tolerance Patient tolerated treatment well    Behavior During Therapy WFL for tasks assessed/performed                  Past Medical History:  Diagnosis Date   Diabetes mellitus (HCC)    Hypertension    Stroke [facility])    Past Surgical History:  Procedure Laterality Date   IVC FILTER PLACEMENT ([facility] HX)     LEG SURGERY     PEG TUBE PLACEMENT     TRACHEOSTOMY     Patient Active Problem List   Diagnosis Date Noted   Sepsis due to vancomycin resistant Enterococcus species (HCC) 06/06/2019   SIRS (systemic inflammatory response syndrome) (HCC) 06/05/2019   Acute lower UTI 06/05/2019   VRE (vancomycin-resistant Enterococci) infection 06/05/2019   Anemia 06/05/2019   Skin ulcer of sacrum with necrosis of muscle (HCC)    Urinary retention    Type 2 diabetes mellitus without complication, with long-term current use of insulin (HCC)    Tachycardia    Lower extremity edema    Acute metabolic encephalopathy    Obstructive sleep apnea    Morbid obesity with BMI of 60.0-69.9, adult (HCC)    Goals of care, counseling/discussion    Palliative care encounter    Sepsis (HCC) 04/27/2019   H/O insulin dependent diabetes mellitus 04/27/2019   History of CVA with residual deficit 04/27/2019   Seizure disorder (HCC) 04/27/2019   Decubitus ulcer of sacral region, stage 4 (HCC) 04/27/2019   ONSET DATE: 01/2019  REFERRING DIAG: CVA/COVID-19  THERAPY DIAG:  Muscle weakness  (generalized)  Other lack of coordination  History of CVA with residual deficit  Rationale for Evaluation and Treatment Rehabilitation  SUBJECTIVE:   SUBJECTIVE STATEMENT:  Pt. reports that it is a bad vision day.   Pt accompanied by: self and family member  PERTINENT HISTORY:  Pt. is a 27 y.o. male who was diagnosed with COVID-19, and CVA with resultant quadriplegia. Pt. Was then hospitalized with VRE UTI. PMHx includes: urinary retention, seizure disorder, obstructive sleep disorder, DM Type II, Morbid obesity.   PRECAUTIONS: None  WEIGHT BEARING RESTRICTIONS No  PAIN:  Are you having pain? Reports right  wrist tightness.  FALLS: Has patient fallen in last 6 months? No  LIVING ENVIRONMENT: Lives with: lives with their family Lives in: House/apartment Stairs: No Level Entry Has following equipment at home: Wheelchair (power) and hoyer lift, sit to stand lift  PLOF: Independent  PATIENT GOALS: To be able to engage in more daily care tasks.  OBJECTIVE:   HAND DOMINANCE: Right  ADLs:  Transfers/ambulation related to ADLs: Eating: Pt. reports being able to hold standard utensils, and is starting to engage more in self-feeding tasks, hand to mouth patterns. Pt. Reports that he does as much as he can with the task, and family assists  with the remainder of the task. Grooming: Pt. Is  able to initiate holding an electric toothbrush, and brush his teeth. Family assists LB Dressing: Total Assist UE dressing: Pt. is now able to reach up to actively assist with grasping , and pulling his gown down. Toileting:  Total Assist Bathing: MaxA UB, Total assist LB Tub Shower transfers: N/A Equipment: See above    IADLs: Shopping: Relies on family to assist Light housekeeping: Total Assist Meal Prep: Total Assist Community mobility:   Medication management:  Total Assist  Financial management: N/A Handwriting: Not legible: Pt. Is able to hold a pen with the left hand, and  initiate marking the page. Pt.'s eye glasses were not available  MOBILITY STATUS:  Power w/c  POSTURE COMMENTS:  Pt. Requires position changes in his power w/c  ACTIVITY TOLERANCE: Activity tolerance:  Fair  FUNCTIONAL OUTCOME MEASURES: FOTO: 40  TR score: 45  05/25/2022:   FOTO: 50 TR score: 45  07/20/22: FOTO 55  UPPER EXTREMITY ROM     Active ROM Left eval Left  03/21/2022 Left 05/25/2022 L  07/20/22 Left 07/25/2022 Left  08/15/2022 Left  09/26/2022 Left  11/16/2022 Left 12/28/2022  Shoulder flexion 119 123 125 130  136 138 130 142  Shoulder abduction 110 115 115 115  118 121 125 142  Shoulder adduction           Shoulder extension           Shoulder internal rotation           Shoulder external rotation           Elbow flexion 135(135) 145 145 145  145     Elbow extension -27(-18) -21(-20) -20(-16)  -25(-10) -23(-10) -21(-10) -20(-18) -20(-18)  Wrist flexion           Wrist extension 10(50) 19(50) 28(50)  30(50) 35(55) 36(55) 36(60) 40(60)  Wrist ulnar deviation           Wrist radial deviation           Wrist pronation           Wrist supination   Limited by flexor tone        (Blank rows = not tested)    Active ROM Right eval Right 03/21/2022 Right 05/25/2022 R  07/20/22 Right  07/25/22 Right 08/15/2022 Right 09/26/2022 Right 11/16/2022 Right 12/28/2022  Shoulder flexion 106 scaption 105  Scaption 62 flexion 110 scaption 100  105 105 105 112 Scaption  Shoulder abduction 114 90 97 100  105 117 120 124  Shoulder adduction           Shoulder extension           Shoulder internal rotation           Shoulder external rotation           Elbow flexion 120(130) 145 145 140  144 140 142 148  Elbow extension -45(-35) -22(-35) -28(-22)  -32(-21) -32(-28) -28(-26) -28(-28) -27(-26)  Wrist flexion           Wrist extension -30(10) -25(10) -10(30)  -10(20) -10(25) -20(40) -30(10) -22(34)  Wrist ulnar deviation           Wrist radial deviation           Wrist pronation            Wrist supination   Limited by flexor tone          12/28/2022: Thumb radial abduction: Right: 12(46), Left: 40(54)  Digits: Bilateral 2nd through 5th digit: MP  Hyperextension with PIP, an DIP flexion     UPPER EXTREMITY MMT:     Left eval Left 03/21/2022 Left  05/25/2022 L 07/20/22 Left  08/15/2022 Left 09/26/2022 Left 11/16/2022 Left 12/28/2022  Shoulder flexion 3-/5 3/5 3+/5 4+/5 4+/5 4+/5 4+/5 4+/5  Shoulder abduction 3-/5 3-/5 4-/5 4+/5 4+/5 4+5 4+/5 4+/5  Shoulder adduction          Shoulder extension          Shoulder internal rotation          Shoulder external rotation          Elbow flexion 3/5 3+/5 4-/5 4+/5 5/5 5/5 5/5 5/5  Elbow extension 2-/5 2/5  4+/5 4+/5 4+/5  4/5  Wrist flexion          Wrist extension 2/5 2+/5 3-/5  3-/5 3-/5 3-/5 3/5  Wrist ulnar deviation          Wrist radial deviation          Wrist pronation          Wrist supination          (Blank rows = not tested)  Right eval Right 03/21/2022 Right  05/25/2022 R 07/20/22 Right 08/15/2022 Right 09/26/2022 Right 11/16/2022 Right 12/28/2022  Shoulder flexion 3-/5 scaption 3-/5 3-/5 3+/5 3+/5 3+/5 3/5 3/5  Shoulder abduction 3-/5 3-/5 3-/5 4/5 4/5 4/5 4/5 4/5  Shoulder adduction          Shoulder extension          Shoulder internal rotation          Shoulder external rotation          Elbow flexion 3/5 3/5 3+/5 4/5 4+/5 4+/5 4+/5 4+/5  Elbow extension 2-/5 2/5 3-/5 4+/5 4+/5 4+/5 4+/5 4-/5  Wrist flexion          Wrist extension 2-/5 2-/5 2/5 2/5 2/5 2/5 2/5 2/5  Wrist ulnar deviation          Wrist radial deviation          Wrist pronation          Wrist supination            HAND FUNCTION:  Grip strength: Right: 0 lbs; Left: 0 lbs and Lateral pinch: Right: 5 lbs, Left: 2 lbs  03/21/2022: Lateral pinch: Right: 3.5 lbs, Left: 2 lbs  07/20/22: Grip strength: Right: 3 lbs; Left: 4 lbs and Lateral pinch: Right: 5 lbs, Left: 3 lbs  08/15/2022:  Grip strength: Right: 0 lbs; Left: 5 lbs  and Lateral pinch: Right: 2 lbs, Left: 4 lbs  09/26/2022  Grip strength: Right: 0 lbs; Left: 8 lbs and Lateral pinch: Right: 2 lbs, Left: 5 lbs  11/16/2022  Grip strength: Right: 0 lbs; Left: 8 lbs  9/25/20204  Grip strength: Right: 0 lbs; Left: 8 lbs    Bilateral digit PIP/DIP flexion contractures with MP hyperextension with attempts for AROM. Pt. is able to tolerate AROM to the bilateral digits at the initial evaluation however, has a history of pain in the digits.  COORDINATION: Eval: Pt. is unable to grasp 9-hole test pegs. Pt. is able to initiate grasping larger pegs, and is able to hold a pen in the left hand.  07/20/22: 2 min 36 seconds to remove 9 pegs from 9 hole peg test - cues to locate pegs 2/2 low vision. Pt. is able to initiate grasping larger pegs on R hand and is able to hold a pen  in the left hand.  08/15/2022:      Vision, and sensation limiting accuracy of 9 hole peg test results. Pt. Was able to grasp and remove vertical pegs intermittently with cues.    SENSATION: Light touch: Impaired   EDEMA:  N/A  MUSCLE TONE: BUE flexor Spasticity  COGNITION Overall cognitive status: Continue to assess in functional context  VISION:   Subjective report: Pt. was not wearing glasses at the time of the initial eval.  Baseline vision: Vision is very limited. Wears glasses all the time Visual history: History of impaired vision following CVA. Pt. Has received treatment through the [facility] low vision rehabilitation program.   VISION ASSESSMENT: Impaired To be further assessed in functional context  PERCEPTION: Impaired   PRAXIS: Impaired: motor planning  OBSERVATIONS:  Pt reports being on Tramadol   TODAY'S TREATMENT    Neuromuscular Re-Education:  Pt completed the Box and Block test successfully moving 4 blocks with his L hand in 1 minute and 0 blocks in his R hand in 1 minute. Limited by ability to locate and sense boxes in hand. Repeated with assistance and pt  completed 1 block in R hand with assist to place block between thumb and 2nd digit.   Therapeutic Ex.: Pt. performed AROM for BUE using Smith machine. Performed tricep extension, chest press, and horizontal abduction on L side for 1 set x 20 reps with 2.5# and 1 set x 10 reps with 7.5# ; R side 1 set x 20 reps with bodyweight and 1 set x 10 reps with 1#. Rest breaks and cues for technique throughout.     PATIENT EDUCATION: Education details: Doctor, general practice and fork positioning for self feeding Person educated: Patient and Parent Education method: Explanation, Demonstration, Tactile cues, and Verbal cues Education comprehension: verbalized understanding, returned demonstration, verbal cues required, and needs further education  HOME EXERCISE PROGRAM:  Continue ongoing assessment, and continue to provide as needed.   GOALS: Goals reviewed with patient? Yes  SHORT TERM GOALS: Target date: 11/07/2022   To assess splint fit, and make appropriate adjustments to promote good skin integrity through the palmar surface of the bilateral hands.  Baseline: 05/25/22: Goal currently met, however ongoing as needs to assess splint fit arise. 03/23/2022: Pt. is wearing splints a couple of hours at night bilateral resting hand splints. 03/21/2022: Pt. is wearing splints a couple of hours at night bilateral resting hand splints. Goal status: Deferred   LONG TERM GOALS: Target date: 05/08/2023    FOTO score will Improve by 2 points for Pt. perceived improvement with the assessment specific ADL/IADL tasks.  Baseline:  12/28/2022: FOTO score: 56; 11/16/2022: 52 09/27/2022: 51 07/20/2022: FOTO 55 05/25/2022: FOTO score: 50,  TR score: 45 Eval: FOTO score: 40,  TR score: 45 Goal status: Achieved   2.   Pt. will independently perform oral care for 100% of the task after complete set-up. Baseline: 02/13/23: Pt. Continues to require assist proximally 01/09/2023: Continue 12/28/2022:  Pt. Continues to complete 90% of  the task with complete set-up, with visual cues, and occassional assist of the right elbow. 11/16/2022: Pt. Completes 90% of the task with complete set-up. 09/26/2022: Completes 90% of the task, proximal assist at times required at the elbow.  08/15/2022: Completes 90% of the task, proximal assist at times required at the elbow. 07/20/22: completes 90% of task, limited by shoulder flexion. 05/25/2022:  Pt. Is able to initiate and perform oral care for approximately 90% of the task. Complete set-up required.  Assi needed only for the very back teeth. 03/23/2022: Pt. Is able to initiate and perform oral care for approximately 75% of the task. Pt. Requires assist at proximally at the elbow for through oral care. 03/21/2022: Pt. Is able to initiate and perform oral care for approximately 75% of the task. Pt. Requires assist at proximally at the elbow for through oral care. Eval: Pt. is able to initiate using an electric toothbrush. Pt. requires assist for set-up, and assist for thoroughness, and as he Pt. fatigues. Goal status: Ongoing  3.  Pt. Will be modified independence with self-feeding for 100% using a swivel spoon, and standard fork after complete set-up Baseline: 02/13/23:  Pt. Is now able to consistently, and efficiently use a swivel spoon for eating cereal with the left hand.01/09/2023: Continue 12/28/2022: Pt. Is now feeding himself 90% of the time using a swivel spoon with the left hand, and 95% of the time with the fork. Pt. Continues to have difficulty with cutting food. 11/16/2022: 100% with finger foods, Pt. Is initiating with a swivel spoon, and universal cuff for fork use, however requires assist for consistency, and accuracy.  09/26/2022: 90% using the spoon, assist at time with the forearm motion, and grip on the utensils. 100% for finger foods.  08/15/2022:  90% using the spoon, assit at time with the forearm motion, and grip on the utensils. 100% for finger foods-independent with set-up- unsupervised.   07/20/22: self-feeds cereal using spoon 90% of task. 05/25/2022: Pt. Is able to use a spoon to scoop cereal when feeding himself cereal 85% of the time. Pt. Is able to feed himself snack/finger foods 100% of the time. Pt. Continues to work on consistency of  stabilizing a cup/mug when drinking. Pt. Is able to grasp a water bottle with assist initially, with assist tapering off as he drinks.03/23/2022: Pt. Is able to perform scooping cereal for 75% of the time. Pt. required assist, and support at the left elbow, and Pt. Presents with limited forearm supination when using the spoon, and bringing it towards his mouth. Pt. Is able to use a fork to spear items, and perform the hand to mouth pattern.  03/21/2022: Pt. Is able to perform scooping cereal for 75% of the time. Pt. required assist, and support at the left elbow, and Pt. presents with limited forearm supination when using the spoon, and bringing it towards his mouth. Pt. Is able to use a fork to spear items, and perform the hand to mouth pattern.  Eval: Pt. is able to hold standard standard utensils. Pt. Performs as much of the task as he, can and has assistance for the remainder. Goal status:   Ongoing  4.  Pt. Will improve grasp patterns and consistently grasp 1/4" objects for ADL, and IADL tasks.  Baseline: 02/13/2023: 1/2" objects efficiently 01/09/2023: Continue 12/28/2022: Continues to grasp 1/4" pegs with min a + visual cues, consistently grasping 1/2" objects with visual cues 11/16/2022: Continues to grasp 1/4" pegs with min a + visual cues, consistently grasping 1/2" objects with visual cues 09/26/2022: Continues to grasp 1/4" pegs with min a + visual cues, consistently grasping 1/2" objects with visual cues. 08/15/2022: grasps 1/4" pegs with min a + visual cues, consistently grasping 1/2" objects with visual cues. 07/20/22: grasps 1/4" pegs with min a + visual cues, consistently grasping 1/2" objects with visual cues. 05/25/2022: Pt. Is working on  improving consistency of grasping 1/2" objects with visual cues.  03/23/2022: Pt. Is able to grasp 1" objects consistently,and  continues to work on the hand patterns needed to grasp 1/2" objects.03/21/2022: Pt. Is able to grasp 1" objects consistently,and continues to work on the hand patterns needed to grasp 1/2" objects. Eval: Pt. is able to grasp 1" objects intermittently using a lateral grasp pattern. Goal status: Ongoing  5.  Pt. will independently write his name legibly with letter sizes under 1". Baseline: 02/13/2023: Continue 01/09/2023: Continue. 12/28/2022: Pt. is now using an IPad Pen. 11/16/2022: Pt. Continues to be able to write name with smaller letter size. Pt. Is signing name with a computer styus. Pt. Requires visual cues, and assist 09/26/2022: Pt. Continues to be able to write name with smaller letter size. Pt. Is signing name with a computer styus. Pt. Requires visual cues, and assist. 08/15/2022: Pt. Is able to write name with smaller letter size. Pt. Is signing name with a computer styus. Pt. Requires visual cues, and assist. 07/20/22: stabilizing assist to write name with 75% legibility with 2" letters. 05/25/2022: Pt. to continue to work towards formulating grasp patterns in preparation for grasping a large width pen. Pt. Requires visual cues. 03/23/2022: Pt. Is able to write his name with modA, however has difficulty with formulating letter sizes less than 2" in size with 50% legibility for the 3 letters of his name.03/21/2022: Pt. Is able to write his name with modA, however has difficulty with formulating letter sizes less than 2" in size with 50% legibility for the 3 letters of his name. Eval: Pt is able to hold a thin marker with his left hand, and formulate a line, and initiate a circular pattern (Pt. without glasses today) Goal status: Ongoing  6. Pt. Will reach up to comb/brush his hair with minA.  Baseline: 02/13/2023:Pt. is able to use a hair pick with the left hand on the right  side of his head, top, and back of the head. Pt. Continues to have difficulty reaching further to the right side of his head with the left. 01/09/2023: Continue 12/28/2022: Pt. Is progressing, and is now able to use a hair pick with the left hand on the right side of his head, top, and back of the head. Pt. Continues to have difficulty reaching further to the right side of his head with the left. 11/16/2022: Pt. Is able to use a hair pick for the left side of his head using his left hand. Pt. Presents with difficulty reaching to the right side of his head.09/26/2022: Pt. Is able to use a hair pick for the left side of his head using his left hand. Pt. Presents with difficulty reaching to the right side of his head. 5/13/202: 75% using a hair pick, assist to the right side of the head. 07/20/22: reaches 75% of head, assist for far R side of head, fatigues quickly. 05/25/2022: Pt. Is able to reach up with the left hand to the left side, top, and back of his head. 03/23/2022: Pt. is now able to more consistently initiate reaching up to his head with his left hand in preparation for haircare12/18/2023: Pt. is now able to more consistently initiate reaching up to his head with his left hand in preparation for haircare. Eval: Pt. is able to initiate reaching up for hair care with a long handled brush, however is unable to sustain UE's in elevation to perform the task.     Goal status: Ongoing  7. Pt. Will independently navigate the w/c through his environment with minA with visual scanning, and hand placement on the controls.  Baseline: 11/16/2022: Deferred 2/2 vision 09/26/2022: Pt. Is now navigating his w/c ouside the home, and around his block. 08/15/2022: Pt. Requires minA to setup hand on controls and MIN cues to navigate the w/c in wide spaces, requires MIN - MOD cues to navigate the w/c through more narrow doorways, and tighter turns - varies based on good vs bad vision days.07/20/22: Pt. Requires minA to setup hand on  controls and MIN cues to navigate the w/c in wide spaces, requires MIN - MOD cues to navigate the w/c through more narrow doorways, and tighter turns - varies based on good vs bad vision days. 05/25/2022: Pt. Requires minA  and  cues to navigate the w/c in wide spaces, and requires Mod cues to navigate the w/c through more narrow doorways, and tighter turns. Pt. Requires max cues for scanning through the environment, and moderate cues for hand placement on the controls.     Goal status: Deferred 2/2 vision    8. Pt. Will improve bilateral grip strength to be able to independently grasp, and pull up blankets, and linens.   Baseline: 02/13/2023: Continue. Pt. presents with digit PIP, and DIP flexor tightness. 01/09/2023: Continue 12/28/2023: R: 0#, L: 5# Pt. Is now able to more consistently grasp and pull blankets up over him. 11/16/2022: R: 0 L: 8# Pt. Is more consistently grasping his blankets and attempting to pull them up. 09/26/2022: R: 0  L: 8# Pt. is attempting to grasp, and pull blankets up more, using mostly the left hand. 08/15/2022: Pt. has difficulty securely holding, and pulling up blankets, and linens.    Goal status: Ongoing  9. Pt. will consistently actively control the releasing  of blankets, covers, and linens from his hands once they are in the desired position over him. Baseline:12/28/2022: Pt. Is consistently actively able to release linens once they are in the desired position over him. 11/16/2022: Pt. continues to improve with actively releasing blankets form his hands. 09/26/2022: Pt. is improving with actively releasing blankets form his hands. 08/15/2022: Pt. has difficulty with controlled releasing of blankets/linens from his grasp.     Goal status: Achieved    10. Pt. Will improve bilateral UE strength by 2 MM grades to assist with ADLs, and IADLs  Baseline: 02/13/2023: Continue 01/09/2023: Continue 12/28/2022: Right: shoulder flexion: 3/5, abduction: 4/5, elbow flexion: 4+/5 , extension:  4-/5 wrist extension: 2/5; Left: shoulder flexion: 4+/5, abduction: 4+/5, elbow flexion: 5/5 , extension: 4/5  wrist extension: 3/5 Right: shoulder flexion: 3+/5, abduction: 4/5, elbow flexion: 4+/5 , extension: 4+/5 wrist extension: 2/5; Left: shoulder flexion: 4+/5, abduction: 4+/5, elbow flexion: 5/5 , extension: 4+/5 wrist extension: 3-/5 11/16/2022: Right: shoulder flexion: 3/5, abduction: 4/5, elbow flexion: 4+/5 , extension: 4+/5 wrist extension: 2/5; Left: shoulder flexion: 4+/5, abduction: 4+/5, elbow flexion: 5/5 , extension:  wrist extension: 3-/5 Right: shoulder flexion: 3+/5, abduction: 4/5, elbow flexion: 4+/5 , extension: 4+/5 wrist extension: 2/5; Left: shoulder flexion: 4+/5, abduction: 4+/5, elbow flexion: 5/5 , extension: 4+/5 wrist extension: 3-/5 Goal status: Ongoing  11. Pt. Will improve right wrist extension by 10 degrees to initiate reaching for items in preparation for ADLs. Baseline: 02/13/2023: Continue 01/09/2023: Continue 12/28/2022: -22(34) 11/16/2022: -30(10)    Goal status: Progressing   ASSESSMENT:  CLINICAL IMPRESSION: Pt tolerated increased weight for AROM this date using 7.5# on L side at [facility] machine and 1# on R side. Required use of ace wrap to maintain grip on bar with R hand. Pt.continues to present with digit PIP, and  DIP flexor tightness. Pt. continues to be highly motivated to continue working towards improving BUE functioning, ROM, strength, motor control, [facility] skills, as well as visual compensatory strategies in order to maximize independence with daily ADLs, and IADL functioning.      PERFORMANCE DEFICITS in functional skills including ADLs, IADLs, coordination, dexterity, proprioception, ROM, strength, pain, FMC, GMC, decreased knowledge of use of DME, and UE functional use, cognitive skills including safety awareness, and psychosocial skills including coping strategies, environmental adaptation, habits, and routines and behaviors.   IMPAIRMENTS are  limiting patient from ADLs, IADLs, leisure, and social participation.   COMORBIDITIES may have co-morbidities  that affects occupational performance. Patient will benefit from skilled OT to address above impairments and improve overall function.  MODIFICATION OR ASSISTANCE TO COMPLETE EVALUATION: Maximum or significant modification of tasks or assist is necessary to complete an evaluation.  OT OCCUPATIONAL PROFILE AND HISTORY: Comprehensive assessment: Review of records and extensive additional review of physical, cognitive, psychosocial history related to current functional performance.  CLINICAL DECISION MAKING: High - multiple treatment options, significant modification of task necessary  REHAB POTENTIAL: Fair    EVALUATION COMPLEXITY: High    PLAN: OT FREQUENCY: 2 x's a week  OT DURATION:12 weeks  PLANNED INTERVENTIONS: self care/ADL training, therapeutic exercise, therapeutic activity, neuromuscular re-education, manual therapy, passive range of motion, patient/family education, and cognitive remediation/compensation RECOMMENDED OTHER SERVICES: PT  CONSULTED AND AGREED WITH PLAN OF CARE: Patient and family member/caregiver  PLAN FOR NEXT SESSION: Review HEP   Kathie Dike, M.S. OTR/L  03/06/23, 5:13 PM  ascom [phone]  03/06/23, 4:00 PM

## 2023-03-08 ENCOUNTER — Ambulatory Visit: Payer: BC Managed Care – PPO

## 2023-03-08 ENCOUNTER — Ambulatory Visit: Payer: Medicare Other

## 2023-03-08 DIAGNOSIS — R262 Difficulty in walking, not elsewhere classified: Secondary | ICD-10-CM

## 2023-03-08 DIAGNOSIS — R269 Unspecified abnormalities of gait and mobility: Secondary | ICD-10-CM | POA: Diagnosis not present

## 2023-03-08 DIAGNOSIS — M6281 Muscle weakness (generalized): Secondary | ICD-10-CM | POA: Diagnosis not present

## 2023-03-08 DIAGNOSIS — R278 Other lack of coordination: Secondary | ICD-10-CM

## 2023-03-08 DIAGNOSIS — I6930 Unspecified sequelae of cerebral infarction: Secondary | ICD-10-CM

## 2023-03-08 DIAGNOSIS — R2681 Unsteadiness on feet: Secondary | ICD-10-CM

## 2023-03-08 DIAGNOSIS — R2689 Other abnormalities of gait and mobility: Secondary | ICD-10-CM | POA: Diagnosis not present

## 2023-03-08 NOTE — Therapy (Signed)
Occupational Therapy Neuro Progress and Treatment Note Reporting period beginning 01/09/23-03/08/23  Patient Name: Paul Hall MRN: [id number] DOB:[birth year], 27 y.o., male Today's Date: 03/12/2023  PCP: Dr. Sherwood Gambler REFERRING PROVIDER: Dr. Sherwood Gambler   OT End of Session - 03/12/23 2051     Visit Number [id number]    Number of Visits 84    Date for OT Re-Evaluation 05/08/23    Authorization Type 09/26/2022    OT Start Time 1541    OT Stop Time 1615    OT Time Calculation (min) 34 min    Equipment Utilized During Treatment tilt in space power wc    Activity Tolerance Patient tolerated treatment well    Behavior During Therapy WFL for tasks assessed/performed                Past Medical History:  Diagnosis Date   Diabetes mellitus (HCC)    Hypertension    Stroke (HCC)    Past Surgical History:  Procedure Laterality Date   IVC FILTER PLACEMENT ([facility] HX)     LEG SURGERY     PEG TUBE PLACEMENT     TRACHEOSTOMY     Patient Active Problem List   Diagnosis Date Noted   Sepsis due to vancomycin resistant Enterococcus species (HCC) 06/06/2019   SIRS (systemic inflammatory response syndrome) (HCC) 06/05/2019   Acute lower UTI 06/05/2019   VRE (vancomycin-resistant Enterococci) infection 06/05/2019   Anemia 06/05/2019   Skin ulcer of sacrum with necrosis of muscle (HCC)    Urinary retention    Type 2 diabetes mellitus without complication, with long-term current use of insulin (HCC)    Tachycardia    Lower extremity edema    Acute metabolic encephalopathy    Obstructive sleep apnea    Morbid obesity with BMI of 60.0-69.9, adult (HCC)    Goals of care, counseling/discussion    Palliative care encounter    Sepsis (HCC) 04/27/2019   H/O insulin dependent diabetes mellitus 04/27/2019   History of CVA with residual deficit 04/27/2019   Seizure disorder (HCC) 04/27/2019   Decubitus ulcer of sacral region, stage 4 (HCC) 04/27/2019   ONSET DATE: 01/2019  REFERRING DIAG:  CVA/COVID-19  THERAPY DIAG:  Muscle weakness (generalized)  Other lack of coordination  History of CVA with residual deficit  Rationale for Evaluation and Treatment Rehabilitation  SUBJECTIVE:   SUBJECTIVE STATEMENT:  Pt arrived late d/t transportation issues.  Mother and pt both agreeable to shorter session this date.  Pt accompanied by: self and family member  PERTINENT HISTORY:  Pt. is a 27 y.o. male who was diagnosed with COVID-19, and CVA with resultant quadriplegia. Pt. Was then hospitalized with VRE UTI. PMHx includes: urinary retention, seizure disorder, obstructive sleep disorder, DM Type II, Morbid obesity.   PRECAUTIONS: None  WEIGHT BEARING RESTRICTIONS No  PAIN:  03/08/23: 0 pain at rest, 5/10 when stretching in BUEs Are you having pain? Reports right  wrist tightness.  FALLS: Has patient fallen in last 6 months? No  LIVING ENVIRONMENT: Lives with: lives with their family Lives in: House/apartment Stairs: No Level Entry Has following equipment at home: Wheelchair (power) and hoyer lift, sit to stand lift  PLOF: Independent  PATIENT GOALS: To be able to engage in more daily care tasks.  OBJECTIVE:   HAND DOMINANCE: Right  ADLs:  Transfers/ambulation related to ADLs: Eating: Pt. reports being able to hold standard utensils, and is starting to engage more in self-feeding tasks, hand to mouth patterns. Pt. Reports that he does  as much as he can with the task, and family assists  with the remainder of the task. Grooming: Pt. Is able to initiate holding an electric toothbrush, and brush his teeth. Family assists LB Dressing: Total Assist UE dressing: Pt. is now able to reach up to actively assist with grasping , and pulling his gown down. Toileting:  Total Assist Bathing: MaxA UB, Total assist LB Tub Shower transfers: N/A Equipment: See above    IADLs: Shopping: Relies on family to assist Light housekeeping: Total Assist Meal Prep: Total  Assist Community mobility:   Medication management:  Total Assist  Financial management: N/A Handwriting: Not legible: Pt. Is able to hold a pen with the left hand, and initiate marking the page. Pt.'s eye glasses were not available  MOBILITY STATUS:  Power w/c  POSTURE COMMENTS:  Pt. Requires position changes in his power w/c  ACTIVITY TOLERANCE: Activity tolerance:  Fair  FUNCTIONAL OUTCOME MEASURES: FOTO: 40  TR score: 45  05/25/2022:   FOTO: 50 TR score: 45  07/20/22: FOTO 55  03/08/23: 51  UPPER EXTREMITY ROM     Active ROM Left eval Left  03/21/2022 Left 05/25/2022 L  07/20/22 Left 07/25/2022 Left  08/15/2022 Left  09/26/2022 Left  11/16/2022 Left 12/28/2022  Shoulder flexion 119 123 125 130  136 138 130 142  Shoulder abduction 110 115 115 115  118 121 125 142  Shoulder adduction           Shoulder extension           Shoulder internal rotation           Shoulder external rotation           Elbow flexion 135(135) 145 145 145  145     Elbow extension -27(-18) -21(-20) -20(-16)  -25(-10) -23(-10) -21(-10) -20(-18) -20(-18)  Wrist flexion           Wrist extension 10(50) 19(50) 28(50)  30(50) 35(55) 36(55) 36(60) 40(60)  Wrist ulnar deviation           Wrist radial deviation           Wrist pronation           Wrist supination   Limited by flexor tone        (Blank rows = not tested)    Active ROM Right eval Right 03/21/2022 Right 05/25/2022 R  07/20/22 Right  07/25/22 Right 08/15/2022 Right 09/26/2022 Right 11/16/2022 Right 12/28/2022  Shoulder flexion 106 scaption 105  Scaption 62 flexion 110 scaption 100  105 105 105 112 Scaption  Shoulder abduction 114 90 97 100  105 117 120 124  Shoulder adduction           Shoulder extension           Shoulder internal rotation           Shoulder external rotation           Elbow flexion 120(130) 145 145 140  144 140 142 148  Elbow extension -45(-35) -22(-35) -28(-22)  -32(-21) -32(-28) -28(-26) -28(-28) -27(-26)   Wrist flexion           Wrist extension -30(10) -25(10) -10(30)  -10(20) -10(25) -20(40) -30(10) -22(34)  Wrist ulnar deviation           Wrist radial deviation           Wrist pronation           Wrist supination   Limited by flexor tone  12/28/2022: Thumb radial abduction: Right: 12(46), Left: 40(54)  Digits: Bilateral 2nd through 5th digit: MP Hyperextension with PIP, an DIP flexion     UPPER EXTREMITY MMT:     Left eval Left 03/21/2022 Left  05/25/2022 L 07/20/22 Left  08/15/2022 Left 09/26/2022 Left 11/16/2022 Left 12/28/2022  Shoulder flexion 3-/5 3/5 3+/5 4+/5 4+/5 4+/5 4+/5 4+/5  Shoulder abduction 3-/5 3-/5 4-/5 4+/5 4+/5 4+5 4+/5 4+/5  Shoulder adduction          Shoulder extension          Shoulder internal rotation          Shoulder external rotation          Elbow flexion 3/5 3+/5 4-/5 4+/5 5/5 5/5 5/5 5/5  Elbow extension 2-/5 2/5  4+/5 4+/5 4+/5  4/5  Wrist flexion          Wrist extension 2/5 2+/5 3-/5  3-/5 3-/5 3-/5 3/5  Wrist ulnar deviation          Wrist radial deviation          Wrist pronation          Wrist supination          (Blank rows = not tested)  Right eval Right 03/21/2022 Right  05/25/2022 R 07/20/22 Right 08/15/2022 Right 09/26/2022 Right 11/16/2022 Right 12/28/2022  Shoulder flexion 3-/5 scaption 3-/5 3-/5 3+/5 3+/5 3+/5 3/5 3/5  Shoulder abduction 3-/5 3-/5 3-/5 4/5 4/5 4/5 4/5 4/5  Shoulder adduction          Shoulder extension          Shoulder internal rotation          Shoulder external rotation          Elbow flexion 3/5 3/5 3+/5 4/5 4+/5 4+/5 4+/5 4+/5  Elbow extension 2-/5 2/5 3-/5 4+/5 4+/5 4+/5 4+/5 4-/5  Wrist flexion          Wrist extension 2-/5 2-/5 2/5 2/5 2/5 2/5 2/5 2/5  Wrist ulnar deviation          Wrist radial deviation          Wrist pronation          Wrist supination            HAND FUNCTION:  Grip strength: Right: 0 lbs; Left: 0 lbs and Lateral pinch: Right: 5 lbs, Left: 2  lbs  03/21/2022: Lateral pinch: Right: 3.5 lbs, Left: 2 lbs  07/20/22: Grip strength: Right: 3 lbs; Left: 4 lbs and Lateral pinch: Right: 5 lbs, Left: 3 lbs  08/15/2022:  Grip strength: Right: 0 lbs; Left: 5 lbs and Lateral pinch: Right: 2 lbs, Left: 4 lbs  09/26/2022  Grip strength: Right: 0 lbs; Left: 8 lbs and Lateral pinch: Right: 2 lbs, Left: 5 lbs  11/16/2022  Grip strength: Right: 0 lbs; Left: 8 lbs  9/25/20204  Grip strength: Right: 0 lbs; Left: 8 lbs  Grip strength: Right: 0 lbs; Left: 8 lbs   03/08/23:  Grip strength: Right: 0 lbs without PROM, 5 lbs after PROM, Left 8 lbs  Bilateral digit PIP/DIP flexion contractures with MP hyperextension with attempts for AROM. Pt. is able to tolerate AROM to the bilateral digits at the initial evaluation however, has a history of pain in the digits.  COORDINATION: Eval: Pt. is unable to grasp 9-hole test pegs. Pt. is able to initiate grasping larger pegs, and is able to hold a pen in the left hand.  07/20/22: 2 min 36 seconds to remove 9 pegs from 9 hole peg test - cues to locate pegs 2/2 low vision. Pt. is able to initiate grasping larger pegs on R hand and is able to hold a pen in the left hand.  08/15/2022:    Vision, and sensation limiting accuracy of 9 hole peg test results. Pt. Was able to grasp and remove vertical pegs intermittently with cues.    SENSATION: Light touch: Impaired   EDEMA:  N/A  MUSCLE TONE: BUE flexor Spasticity  COGNITION Overall cognitive status: Continue to assess in functional context  VISION:   Subjective report: Pt. was not wearing glasses at the time of the initial eval.  Baseline vision: Vision is very limited. Wears glasses all the time Visual history: History of impaired vision following CVA. Pt. Has received treatment through the [facility] low vision rehabilitation program.   VISION ASSESSMENT: Impaired To be further assessed in functional context  PERCEPTION: Impaired   PRAXIS:  Impaired: motor planning  OBSERVATIONS:  Pt reports being on Tramadol   TODAY'S TREATMENT   Therapeutic Activity: Objective measures taken and goals updated/reviewed for reassessment visit.   Therapeutic Exercise: PROM completed for digit and wrist extension bilaterally in prep for grip strength assessment, also working to promote tendon lengthening to reduce contracture risk and maximize movement potential for grasping and releasing ADL supplies.   PATIENT EDUCATION: Education details: progress towards goals Person educated: Patient and Parent Education method: Explanation Education comprehension: verbalized understanding  HOME EXERCISE PROGRAM:  Continue ongoing assessment, and continue to provide as needed.   GOALS: Goals reviewed with patient? Yes  SHORT TERM GOALS: Target date: 11/07/2022   To assess splint fit, and make appropriate adjustments to promote good skin integrity through the palmar surface of the bilateral hands.  Baseline: 05/25/22: Goal currently met, however ongoing as needs to assess splint fit arise. 03/23/2022: Pt. is wearing splints a couple of hours at night bilateral resting hand splints. 03/21/2022: Pt. is wearing splints a couple of hours at night bilateral resting hand splints. Goal status: Deferred   LONG TERM GOALS: Target date: 05/08/2023    FOTO score will Improve by 2 points for Pt. perceived improvement with the assessment specific ADL/IADL tasks.  Baseline:  03/08/23: 51; 12/28/2022: FOTO score: 56; 11/16/2022: 52 09/27/2022: 51 07/20/2022: FOTO 55 05/25/2022: FOTO score: 50,  TR score: 45 Eval: FOTO score: 40,  TR score: 45 Goal status: Achieved   2.   Pt. will independently perform oral care for 100% of the task after complete set-up. Baseline: 03/08/23: not as much assist required proximally, but to adjust positioning of toothbrush; 02/13/23: Pt. Continues to require assist proximally 01/09/2023: Continue 12/28/2022:  Pt. Continues to complete 90% of  the task with complete set-up, with visual cues, and occassional assist of the right elbow. 11/16/2022: Pt. Completes 90% of the task with complete set-up. 09/26/2022: Completes 90% of the task, proximal assist at times required at the elbow.  08/15/2022: Completes 90% of the task, proximal assist at times required at the elbow. 07/20/22: completes 90% of task, limited by shoulder flexion. 05/25/2022:  Pt. Is able to initiate and perform oral care for approximately 90% of the task. Complete set-up required. Assi needed only for the very back teeth. 03/23/2022: Pt. Is able to initiate and perform oral care for approximately 75% of the task. Pt. Requires assist at proximally at the elbow for through oral care. 03/21/2022: Pt. Is able to initiate and perform oral care for  approximately 75% of the task. Pt. Requires assist at proximally at the elbow for through oral care. Eval: Pt. is able to initiate using an electric toothbrush. Pt. requires assist for set-up, and assist for thoroughness, and as he Pt. fatigues. Goal status: Ongoing  3.  Pt. Will be modified independence with self-feeding for 100% using a swivel spoon, and standard fork after complete set-up Baseline: 03/08/23: indep with cup with a handle and lid, assist to reposition eating utensils in hand;  02/13/23:  Pt. Is now able to consistently, and efficiently use a swivel spoon for eating cereal with the left hand.01/09/2023: Continue 12/28/2022: Pt. Is now feeding himself 90% of the time using a swivel spoon with the left hand, and 95% of the time with the fork. Pt. Continues to have difficulty with cutting food. 11/16/2022: 100% with finger foods, Pt. Is initiating with a swivel spoon, and universal cuff for fork use, however requires assist for consistency, and accuracy.  09/26/2022: 90% using the spoon, assist at time with the forearm motion, and grip on the utensils. 100% for finger foods.  08/15/2022:  90% using the spoon, assit at time with the forearm  motion, and grip on the utensils. 100% for finger foods-independent with set-up- unsupervised.  07/20/22: self-feeds cereal using spoon 90% of task. 05/25/2022: Pt. Is able to use a spoon to scoop cereal when feeding himself cereal 85% of the time. Pt. Is able to feed himself snack/finger foods 100% of the time. Pt. Continues to work on consistency of  stabilizing a cup/mug when drinking. Pt. Is able to grasp a water bottle with assist initially, with assist tapering off as he drinks.03/23/2022: Pt. Is able to perform scooping cereal for 75% of the time. Pt. required assist, and support at the left elbow, and Pt. Presents with limited forearm supination when using the spoon, and bringing it towards his mouth. Pt. Is able to use a fork to spear items, and perform the hand to mouth pattern.  03/21/2022: Pt. Is able to perform scooping cereal for 75% of the time. Pt. required assist, and support at the left elbow, and Pt. presents with limited forearm supination when using the spoon, and bringing it towards his mouth. Pt. Is able to use a fork to spear items, and perform the hand to mouth pattern.  Eval: Pt. is able to hold standard standard utensils. Pt. Performs as much of the task as he, can and has assistance for the remainder. Goal status:   Ongoing  4.  Pt. Will improve grasp patterns and consistently grasp 1/4" objects for ADL, and IADL tasks.  Baseline: 03/08/23: 1/2" objects efficiently; 02/13/2023: 1/2" objects efficiently 01/09/2023: Continue 12/28/2022: Continues to grasp 1/4" pegs with min a + visual cues, consistently grasping 1/2" objects with visual cues 11/16/2022: Continues to grasp 1/4" pegs with min a + visual cues, consistently grasping 1/2" objects with visual cues 09/26/2022: Continues to grasp 1/4" pegs with min a + visual cues, consistently grasping 1/2" objects with visual cues. 08/15/2022: grasps 1/4" pegs with min a + visual cues, consistently grasping 1/2" objects with visual cues. 07/20/22:  grasps 1/4" pegs with min a + visual cues, consistently grasping 1/2" objects with visual cues. 05/25/2022: Pt. Is working on improving consistency of grasping 1/2" objects with visual cues.  03/23/2022: Pt. Is able to grasp 1" objects consistently,and continues to work on the hand patterns needed to grasp 1/2" objects.03/21/2022: Pt. Is able to grasp 1" objects consistently,and continues to work on the hand  patterns needed to grasp 1/2" objects. Eval: Pt. is able to grasp 1" objects intermittently using a lateral grasp pattern. Goal status: Ongoing  5.  Pt. will independently write his name legibly with letter sizes under 1". Baseline: 03/08/23: Not addressed this period; 02/13/2023: Continue 01/09/2023: Continue. 12/28/2022: Pt. is now using an IPad Pen. 11/16/2022: Pt. Continues to be able to write name with smaller letter size. Pt. Is signing name with a computer styus. Pt. Requires visual cues, and assist 09/26/2022: Pt. Continues to be able to write name with smaller letter size. Pt. Is signing name with a computer styus. Pt. Requires visual cues, and assist. 08/15/2022: Pt. Is able to write name with smaller letter size. Pt. Is signing name with a computer styus. Pt. Requires visual cues, and assist. 07/20/22: stabilizing assist to write name with 75% legibility with 2" letters. 05/25/2022: Pt. to continue to work towards formulating grasp patterns in preparation for grasping a large width pen. Pt. Requires visual cues. 03/23/2022: Pt. Is able to write his name with modA, however has difficulty with formulating letter sizes less than 2" in size with 50% legibility for the 3 letters of his name.03/21/2022: Pt. Is able to write his name with modA, however has difficulty with formulating letter sizes less than 2" in size with 50% legibility for the 3 letters of his name. Eval: Pt is able to hold a thin marker with his left hand, and formulate a line, and initiate a circular pattern (Pt. without glasses today) Goal  status: Ongoing  6. Pt. Will reach up to comb/brush his hair with minA.  Baseline: 03/08/23: Pt uses pick in L hand to reach 75% of his head; unable to reach R side of head; 02/13/2023:Pt. is able to use a hair pick with the left hand on the right side of his head, top, and back of the head. Pt. Continues to have difficulty reaching further to the right side of his head with the left. 01/09/2023: Continue 12/28/2022: Pt. Is progressing, and is now able to use a hair pick with the left hand on the right side of his head, top, and back of the head. Pt. Continues to have difficulty reaching further to the right side of his head with the left. 11/16/2022: Pt. Is able to use a hair pick for the left side of his head using his left hand. Pt. Presents with difficulty reaching to the right side of his head.09/26/2022: Pt. Is able to use a hair pick for the left side of his head using his left hand. Pt. Presents with difficulty reaching to the right side of his head. 5/13/202: 75% using a hair pick, assist to the right side of the head. 07/20/22: reaches 75% of head, assist for far R side of head, fatigues quickly. 05/25/2022: Pt. Is able to reach up with the left hand to the left side, top, and back of his head. 03/23/2022: Pt. is now able to more consistently initiate reaching up to his head with his left hand in preparation for haircare12/18/2023: Pt. is now able to more consistently initiate reaching up to his head with his left hand in preparation for haircare. Eval: Pt. is able to initiate reaching up for hair care with a long handled brush, however is unable to sustain UE's in elevation to perform the task.     Goal status: Ongoing  7. Pt. Will independently navigate the w/c through his environment with minA with visual scanning, and hand placement on the controls.  Baseline: 11/16/2022: Deferred 2/2 vision 09/26/2022: Pt. Is now navigating his w/c ouside the home, and around his block. 08/15/2022: Pt. Requires minA to  setup hand on controls and MIN cues to navigate the w/c in wide spaces, requires MIN - MOD cues to navigate the w/c through more narrow doorways, and tighter turns - varies based on good vs bad vision days.07/20/22: Pt. Requires minA to setup hand on controls and MIN cues to navigate the w/c in wide spaces, requires MIN - MOD cues to navigate the w/c through more narrow doorways, and tighter turns - varies based on good vs bad vision days. 05/25/2022: Pt. Requires minA  and  cues to navigate the w/c in wide spaces, and requires Mod cues to navigate the w/c through more narrow doorways, and tighter turns. Pt. Requires max cues for scanning through the environment, and moderate cues for hand placement on the controls.     Goal status: Deferred 2/2 vision    8. Pt. Will improve bilateral grip strength to be able to independently grasp, and pull up blankets, and linens.   Baseline: 03/08/23: R grip 0-5 lbs, L grip 8 lbs; pt reports he can consistently pull up blankets and linens independently.  02/13/2023: Continue. Pt. presents with digit PIP, and DIP flexor tightness. 01/09/2023: Continue 12/28/2023: R: 0#, L: 5# Pt. Is now able to more consistently grasp and pull blankets up over him. 11/16/2022: R: 0 L: 8# Pt. Is more consistently grasping his blankets and attempting to pull them up. 09/26/2022: R: 0  L: 8# Pt. is attempting to grasp, and pull blankets up more, using mostly the left hand. 08/15/2022: Pt. has difficulty securely holding, and pulling up blankets, and linens.    Goal status: Ongoing  9. Pt. will consistently actively control the releasing of blankets, covers, and linens from his hands once they are in the desired position over him. Baseline:12/28/2022: Pt. Is consistently actively able to release linens once they are in the desired position over him. 11/16/2022: Pt. continues to improve with actively releasing blankets form his hands. 09/26/2022: Pt. is improving with actively releasing blankets form his  hands. 08/15/2022: Pt. has difficulty with controlled releasing of blankets/linens from his grasp.     Goal status: Achieved    10. Pt. Will improve bilateral UE strength by 2 MM grades to assist with ADLs, and IADLs  Baseline: 03/08/23: NT this date d/t as pt arrived late; 02/13/2023: Continue 01/09/2023: Continue 12/28/2022: Right: shoulder flexion: 3/5, abduction: 4/5, elbow flexion: 4+/5 , extension: 4-/5 wrist extension: 2/5; Left: shoulder flexion: 4+/5, abduction: 4+/5, elbow flexion: 5/5 , extension: 4/5  wrist extension: 3/5 Right: shoulder flexion: 3+/5, abduction: 4/5, elbow flexion: 4+/5 , extension: 4+/5 wrist extension: 2/5; Left: shoulder flexion: 4+/5, abduction: 4+/5, elbow flexion: 5/5 , extension: 4+/5 wrist extension: 3-/5 11/16/2022: Right: shoulder flexion: 3/5, abduction: 4/5, elbow flexion: 4+/5 , extension: 4+/5 wrist extension: 2/5; Left: shoulder flexion: 4+/5, abduction: 4+/5, elbow flexion: 5/5 , extension:  wrist extension: 3-/5 Right: shoulder flexion: 3+/5, abduction: 4/5, elbow flexion: 4+/5 , extension: 4+/5 wrist extension: 2/5; Left: shoulder flexion: 4+/5, abduction: 4+/5, elbow flexion: 5/5 , extension: 4+/5 wrist extension: 3-/5 Goal status: Ongoing  11. Pt. Will improve right wrist extension by 10 degrees to initiate reaching for items in preparation for ADLs. Baseline: 03/08/23: NT this date as pt arrived late; 02/13/2023: Continue 01/09/2023: Continue 12/28/2022: -22(34) 11/16/2022: -30(10)    Goal status: Progressing   ASSESSMENT: CLINICAL IMPRESSION: Objective measures taken and goals  updated for progress assessment.  Time constraints limited assessment of BUE ROM and MMT as pt arrived late, but will plan next session and update last 2 goals as needed next session.  Slight decline in FOTO score by 5 points as compared to when last taken end of Sept.  R grip strength 5 lbs this date after a 3rd trial and passive stretching, first 2 trials 0 lbs without having  stretched to reduce flexor tone.  L grip remains unchanged.  Pt continues to participate in self feeding, combing hair with a pick in his L hand, and grasping 1/2" ADL supplies.  Pt. continues to be highly motivated to continue working towards improving BUE functioning, ROM, strength, motor control, [facility] skills, as well as visual compensatory strategies in order to maximize independence with daily ADLs, and IADL functioning.      PERFORMANCE DEFICITS in functional skills including ADLs, IADLs, coordination, dexterity, proprioception, ROM, strength, pain, FMC, GMC, decreased knowledge of use of DME, and UE functional use, cognitive skills including safety awareness, and psychosocial skills including coping strategies, environmental adaptation, habits, and routines and behaviors.   IMPAIRMENTS are limiting patient from ADLs, IADLs, leisure, and social participation.   COMORBIDITIES may have co-morbidities  that affects occupational performance. Patient will benefit from skilled OT to address above impairments and improve overall function.  MODIFICATION OR ASSISTANCE TO COMPLETE EVALUATION: Maximum or significant modification of tasks or assist is necessary to complete an evaluation.  OT OCCUPATIONAL PROFILE AND HISTORY: Comprehensive assessment: Review of records and extensive additional review of physical, cognitive, psychosocial history related to current functional performance.  CLINICAL DECISION MAKING: High - multiple treatment options, significant modification of task necessary  REHAB POTENTIAL: Fair    EVALUATION COMPLEXITY: High    PLAN: OT FREQUENCY: 2 x's a week  OT DURATION:12 weeks  PLANNED INTERVENTIONS: self care/ADL training, therapeutic exercise, therapeutic activity, neuromuscular re-education, manual therapy, passive range of motion, patient/family education, and cognitive remediation/compensation RECOMMENDED OTHER SERVICES: PT  CONSULTED AND AGREED WITH PLAN OF CARE:  Patient and family member/caregiver  PLAN FOR NEXT SESSION: Review HEP  Danelle Earthly, MS, OTR/L

## 2023-03-08 NOTE — Therapy (Unsigned)
OUTPATIENT PHYSICAL THERAPY TREATMENT     Patient Name: Paul Hall MRN: [id number] DOB:Jan 24, 1996, 27 y.o., male Today's Date: 03/09/2023  PCP:  Cindi Carbon PROVIDER:  Lenise Herald, PA-C   PT End of Session - 03/08/23 1611     Visit Number 138    Number of Visits 146    Date for PT Re-Evaluation 02/22/23    Authorization Type BCBS COMM Pro/  Medicaid    Authorization Time Period 01/04/21-03/29/21; Recert 03/24/2021-06/16/2021; Recert 09/15/2021- 12/08/2021; Recert 12/13/2021-03/07/2022    Progress Note Due on Visit 140    PT Start Time 1615    PT Stop Time 1658    PT Time Calculation (min) 43 min    Equipment Utilized During Treatment Gait belt    Activity Tolerance Patient tolerated treatment well    Behavior During Therapy WFL for tasks assessed/performed                     Past Medical History:  Diagnosis Date   Diabetes mellitus (HCC)    Hypertension    Stroke (HCC)    Past Surgical History:  Procedure Laterality Date   IVC FILTER PLACEMENT ([facility] HX)     LEG SURGERY     PEG TUBE PLACEMENT     TRACHEOSTOMY     Patient Active Problem List   Diagnosis Date Noted   Sepsis due to vancomycin resistant Enterococcus species (HCC) 06/06/2019   SIRS (systemic inflammatory response syndrome) (HCC) 06/05/2019   Acute lower UTI 06/05/2019   VRE (vancomycin-resistant Enterococci) infection 06/05/2019   Anemia 06/05/2019   Skin ulcer of sacrum with necrosis of muscle (HCC)    Urinary retention    Type 2 diabetes mellitus without complication, with long-term current use of insulin (HCC)    Tachycardia    Lower extremity edema    Acute metabolic encephalopathy    Obstructive sleep apnea    Morbid obesity with BMI of 60.0-69.9, adult (HCC)    Goals of care, counseling/discussion    Palliative care encounter    Sepsis (HCC) 04/27/2019   H/O insulin dependent diabetes mellitus 04/27/2019   History of CVA with residual deficit 04/27/2019   Seizure  disorder (HCC) 04/27/2019   Decubitus ulcer of sacral region, stage 4 (HCC) 04/27/2019    REFERRING DIAG: Cerebral infarction, unspecified   THERAPY DIAG:  Muscle weakness (generalized)  Other lack of coordination  Difficulty in walking, not elsewhere classified  Unsteadiness on feet  Abnormality of gait and mobility  Other abnormalities of gait and mobility  Rationale for Evaluation and Treatment Rehabilitation  PERTINENT HISTORY: Paul Hall is a 26yoM who presents with severe weakness, quadriparesis, altered sensorium, and visual impairment s/p critical illness and prolonged hospitalization. Pt hospitalized in October 2020 with ARDS 2/2 COVID19 infection. Pt sustained a complex and lengthy hospitalization which included tracheostomy, prolonged sedation, ECMO. In this period pt sustained CVA and SDH. Pt has now been liberated from tracheostomy and G-tube. Pt has since been hospitalized for wound infection and UTI. Pt lives with parents at home, has hospital bed and left chair, hoyer lift transfers, and power WC for mobility needs. Pt needs heavy physical assistance with ADL 2/2 BUE contractures and motor dysfunction   PRECAUTIONS: Fall  SUBJECTIVE:  Pt reported feeling well overall without any issues today.     PAIN:  Are you having pain? No   TODAY'S TREATMENT:  - 03/09/23    *Dependent transfer from w/c to edge of mat using hoyer lift (+2  person assist)  Therapeutic Exercises: (at edge of mat)  Lateral Trunk Leans x 10 each side Forward/Backward Trunk Leans x 20 each direction Sitting Marches 2x10 each LE LAQ 2x10 each LE  Therapeutic activities: (at edge of mat)  Static standing trials x 3 - All less than 1 min each (max effort from patient and max assist +2 to remain in standing). Patient exhibited difficulty with standing erect and on second and third trial - he was able to accept feedback to engage glutes better and look ahead for his best quality stand. He  required increased rest break as he was straining hard with standing and can become dizzy at times- None today however.   PATIENT EDUCATION: Education details: Exercise technique with VC   HOME EXERCISE PROGRAM:  Access Code: [email or web address] URL: [email or web address] Date: 03/23/2022 Prepared by: Maureen Ralphs  Exercises - Supine Bridge  - 3 x weekly - 3 sets - 10 reps - 2 hold - Supine Gluteal Sets  - 3 x weekly - 3 sets - 10 reps - 5 sec hold - Supine Quad Set  - 1 x daily - 3 x weekly - 3 sets - 10 reps - 5 hold - Seated Long Arc Quad  - 1 x daily - 7 x weekly - 3 sets - 10 reps - Seated Hip Adduction Squeeze with Ball  - 1 x daily - 3 x weekly - 3 sets - 10 reps - 5 hold - Seated Hip Abduction  - 1 x daily - 3 x weekly - 3 sets - 10 reps - 2 hold    PT Short Term Goals -       PT SHORT TERM GOAL #1   Title Pt will be independent with HEP in order to improve strength and balance in order to decrease fall risk and improve function at home and work.    Baseline 01/04/2021= No formal HEP in place; 12/12 no HEP in place; 05/10/2021-Patient and his father were able to report compliance with curent HEP consisting of mostly seated/reclined LE strengthening. Both verbalize no questions at this time.    Time 6    Period Weeks    Status Achieved    Target Date 02/15/21            PT LONG TERM GOAL #1     Title Patient will increase BLE gross strength by 1/2 muscle grade to improve functional strength for improved independence with potential gait, increased standing tolerance and increased ADL ability.     Baseline 01/04/2021- Patient presents with 1/5 to 3-/5 B LE strength with MMT; 12/12: goal partially met for Left knee/hip; 05/10/2021= 2-/5 bilateal Hip flex; 3+/5 bilateral Knee ext; 06/21/2021= Patient presents with 2-/5 bilateral Hip flex; 3+/5 bilateral knee ext/flex; 2-/5 left ankle DF; 0/5 right ankle- and able to increase reps and resistance with LE's. 09/15/2021-  Patient technically presents with 2-/5 B hip flex/abd/add - but he is able to raise his hip up to approx 100 deg which has improved. 3+/5 Bilateral knee ext, 2-/5 left ankle and 0/5 right ankle.  12/08/2021= Patient able to lift left knee at 110 deg of hip flex; presents with 3+/5 knee ext, 2-/5 left ankle DF and 0/5 right ankle DF, 2-/5 bilateral Hip abd in seated position.    12/6: R: knee 3+/5 ext, 2/5 flexion, left knee 3+/5 extension, 3+/5 flexion, R hip: 2+/5 hip add, 2+/5 hip ABD L hip: 4-/5 hip ABD, 3+/5 hip ADD, 3+/5 hip flexion; 06/06/2022= Patient  now presents with 2-/5 right ankle DF/PF;     Time 12     Period Weeks     Status MET    Target Date 03/07/2022         PT LONG TERM GOAL #2    Title Patient will tolerate sitting unsupported demonstrating erect sitting posture for 15 minutes with CGA to demonstrate improved back extensor strength and improved sitting tolerance.     Baseline 01/04/2021- Patient confied to sitting in lift chair or electric power chair with back support and unable to sit upright without physical assistance; 12/12: tolerates <1 minutes upright unsupported sitting. 05/10/2021=static sit with forward trunk lean  in his power wheelchair without back support x approx 3 min. 06/21/2021=Unable to assess today due to patient with acute back pain but on previous visit able to sit x 8 min without back support. 09/15/2021- on last visit- 09/13/2021- patient was able to sit unsupported x 8 min at edge of mat. 10/13/2023 - Patient was able to sit at edge of mat with varying level of assist today from SBA to min A for a total of 20 min. 12/13/2021= Patient demonstrated unsupported sitting at edge of mat for approx 20 min    Time 12     Period Weeks     Status GOAL MET    Target Date 12/08/21          PT LONG TERM GOAL #3    Title Patient will demonstrate ability to perform static standing in // bars > 2 min with Max Assist  without loss of balance and fair posture for improved overall  strength for pre-gait and transfer activities.     Baseline 01/04/2021= Patient current uanble to stand- Dependent on hoyer or sit to stand lift for transfers. 05/10/2021=Not appropriate yet- Currently still dependent with all transfers using hoyer. 06/21/2021= Patient continuing now to focus on LE strengthening to prepare for standing-unable to try today due to acute low back pain-  planning on attempting in new cert period. 09/15/2021- Patient has attempted standing 2x in past two week- max Assist of 2 people - only once was he successful to clearing his bottom from chair - Will continue to be a focus during the new certification. 12/13/2021= Patient has been limited secondary to increased overall low back pain during this certification and will require more time to focus on this goal.  12/6: not assessed this date, will assess at date when 2-3 PTs are present for assistance     Time 12     Period Weeks     Status GOAL not appropriate at this time - may attempt in future once Patient presents with improved overall LE strength.                 PT LONG TERM GOAL #4    Title Pt will improve FOTO score by 10 points or more demonstrating improved perceived functional ability     Baseline FOTO 7 on 10/17; 03/15/21: FOTO 12; 05/10/2021 06/21/2021= 1; 09/15/2021= 9; 12/13/2021= Will issue next visit 12/6: 4; 06/06/2022= Will assess next visit; Goal not appropriate as patient is not ambulatory.     Time 12     Period Weeks     Status WITHDRAWN    Target date 11/30/2022         PT LONG TERM GOAL #5    Title Patient will perform sit to stand transfer with appropriate AD and max assist of 2 people with 75% consistency  to prepare for pregait activities.     Baseline 09/15/2021= Patient unable to stand well- unable to clear his bottom off chair with Max assist of 2 persons. 12/13/2021- Goal not appropriate to try yet but will keep and roll over to next cert as shift continues to focus on transfers/standing; 4/24= Patient able  to perform active ankle DF/PF with right LE and able to raise his knee into seated march and clear floor without physical assist today - as previously unable as well as lift right knee ext to near full ROM to improve strength for eventual standing. 09/07/2022- Goal continues to not be appropriate but patient is now standing some and will keep goal active; 11/23/2022= patient is now performing sit to stand with max A +2 and now able to clear his bottom off mat. 11/30/2022- Patient continues to perform sit to stand transfers with Max A+2 and able to clear bottom well off mat but not erect yet-     Time 12     Period Weeks     Status PROGRESSING    Target date  02/22/2023       PT LONG TERM GOAL #6  Title Patient will tolerate sitting unsupported demonstrating erect sitting posture for 30 minutes with CGA to demonstrate improved back extensor strength and improved sitting tolerance.   Baseline 12/13/2021= Patient demonstrated unsupported sitting at edge of mat for approx 20 min;  12/29/2021- Patient performed approx 30 min of dynamic sitting activities today. 06/06/2022= Patient demonstrated ability to sit and perform static and dynamic UE/LE movement with only Supervision.   Time 12   Period Weeks   Status GOAL MET           7.  Patient will tolerate 2 minutes or more of standing in sit to stand lift or with max assist +2 in order to indicate improved lower extremity weightbearing tolerance for progression to standing in parallel bars. Baseline: 1 minute on most recent stand 02/21/22; 06/06/2022- Patient did attempt today after complaining of right LE pain last week. He attempted 3 stands using sit to stand lift. - all over 1 min- last one approx 48 sec- stopped due to fatigue. More erect standing in lift today - still poor gluteal strength but able to activate glutes and extend hips upon command but unable to hold > 5 sec. 07/28/2022- Will attempt standing next visit but patient has been able to stand  since last progress note for up to 3 min at a time at his best. 09/07/2022-  attempted standing with max +2 and patient able to stand 15-20 sec so will now  revise goal; 11/23/2022- Patient at his best between last 2 visits was able to stand approx 50 sec with max A+2.  11/30/2022- Patient performed 4 trials of static standing - max A +2- clearing bottom and using forearms to bear weight  Goal status: Revised Target date: 02/22/2023   8. Patient will tolerate sitting unsupported demonstrating ability to perform dynamic UE/LE activities for 30 minutes  independently to demonstrate ability to sit at edge of bed to eat or perform some ADL's/exercise for optimal quality of life.             Baseline: 06/06/2022- Patient able to sit and perform some UE/LE exercises but requires CGA at times for safety- he is able to static sit for 30 min with supervision. 07/27/2022= Patient able to now sit > 30 min with Supervision only - performing static and dynamic activities. Will keep  goal active to ensure patient is consistent. 09/07/2022=  Patient able to demonstrate consistent ability to sit at edge of mat during visits             Goal Status: MET             Target date: 08/29/2022        Plan     Clinical Impression Statement Patient demonstrated great effort today. Pt was motivated to stand after completing seated therex. Pt stated that he felt that his static standing trials were some of his best trials in a while. Pt felt accomplished after today's visit. Pt will continue to benefit from skilled physical therapy intervention to address impairments, improve QOL, and attain therapy goals.    Personal Factors and Comorbidities Comorbidity 3+;Time since onset of injury/illness/exacerbation    Comorbidities CVA, diabetes, Seizures    Examination-Activity Limitations Bathing;Bed Mobility;Bend;Caring for Others;Carry;Dressing;Hygiene/Grooming;Lift;Locomotion Level;Reach Overhead;Self  Feeding;Sit;Squat;Stairs;Stand;Transfers;Toileting    Examination-Participation Restrictions Cleaning;Community Activity;Driving;Laundry;Medication Management;Meal Prep;Occupation;Personal Finances;Shop;Yard Work;Volunteer    Stability/Clinical Decision Making Evolving/Moderate complexity    Rehab Potential Fair    PT Frequency 2x / week    PT Duration 12 weeks    PT Treatment/Interventions ADLs/Self Care Home Management;Cryotherapy;Electrical Stimulation;Moist Heat;Ultrasound;DME Instruction;Gait training;Stair training;Functional mobility training;Therapeutic exercise;Balance training;Patient/family education;Orthotic Fit/Training;Neuromuscular re-education;Wheelchair mobility training;Manual techniques;Passive range of motion;Dry needling;Energy conservation;Taping;Visual/perceptual remediation/compensation;Joint Manipulations    PT Next Visit Plan core strength/motor control in short-sitting, sit to stand if able; Continue with progressive LE Strengthening. Stand activities as appropriate.     PT Home Exercise Plan No changes to HEP today    Consulted and Agree with Plan of Care Patient;Family member/caregiver    Family Member Consulted           [place], SPT  This entire session was performed under direct supervision and direction of a licensed Estate agent . I have personally read, edited and approve of the note as written.    03/09/2023, 8:04 AM      8:04 AM, 03/09/23 Louis Meckel, PT Physical Therapist - [place] [facility]  Outpatient Physical Therapy- [facility] [phone]

## 2023-03-09 DIAGNOSIS — I82509 Chronic embolism and thrombosis of unspecified deep veins of unspecified lower extremity: Secondary | ICD-10-CM | POA: Diagnosis not present

## 2023-03-13 ENCOUNTER — Ambulatory Visit: Payer: Medicare Other

## 2023-03-13 ENCOUNTER — Ambulatory Visit: Payer: BC Managed Care – PPO | Admitting: Occupational Therapy

## 2023-03-13 DIAGNOSIS — R262 Difficulty in walking, not elsewhere classified: Secondary | ICD-10-CM

## 2023-03-13 DIAGNOSIS — I6930 Unspecified sequelae of cerebral infarction: Secondary | ICD-10-CM | POA: Diagnosis not present

## 2023-03-13 DIAGNOSIS — M6281 Muscle weakness (generalized): Secondary | ICD-10-CM

## 2023-03-13 DIAGNOSIS — R278 Other lack of coordination: Secondary | ICD-10-CM

## 2023-03-13 DIAGNOSIS — R269 Unspecified abnormalities of gait and mobility: Secondary | ICD-10-CM

## 2023-03-13 DIAGNOSIS — R2681 Unsteadiness on feet: Secondary | ICD-10-CM | POA: Diagnosis not present

## 2023-03-13 DIAGNOSIS — R2689 Other abnormalities of gait and mobility: Secondary | ICD-10-CM

## 2023-03-13 NOTE — Therapy (Signed)
OUTPATIENT PHYSICAL THERAPY TREATMENT     Patient Name: Paul Hall MRN: [id number] DOB:07/22/1995, 27 y.o., male Today's Date: 03/13/2023  PCP:  Cindi Carbon PROVIDER:  Lenise Herald, PA-C   PT End of Session - 03/13/23 1528     Visit Number 139    Number of Visits 146    Date for PT Re-Evaluation 02/22/23    Authorization Type [employer] COMM [employer]/ [place] [employer]    Authorization Time Period 01/04/21-03/29/21; Recert 03/24/2021-06/16/2021; Recert 09/15/2021- 12/08/2021; Recert 12/13/2021-03/07/2022    Progress Note Due on Visit 140    PT Start Time 1530    PT Stop Time 1614    PT Time Calculation (min) 44 min    Equipment Utilized During Treatment Gait belt    Activity Tolerance Patient tolerated treatment well    Behavior During Therapy WFL for tasks assessed/performed                     Past Medical History:  Diagnosis Date   Diabetes mellitus (HCC)    Hypertension    Stroke (HCC)    Past Surgical History:  Procedure Laterality Date   IVC FILTER PLACEMENT ([facility] HX)     LEG SURGERY     PEG TUBE PLACEMENT     TRACHEOSTOMY     Patient Active Problem List   Diagnosis Date Noted   Sepsis due to vancomycin resistant Enterococcus species (HCC) 06/06/2019   SIRS (systemic inflammatory response syndrome) (HCC) 06/05/2019   Acute lower UTI 06/05/2019   VRE (vancomycin-resistant Enterococci) infection 06/05/2019   Anemia 06/05/2019   Skin ulcer of sacrum with necrosis of muscle (HCC)    Urinary retention    Type 2 diabetes mellitus without complication, with long-term current use of insulin (HCC)    Tachycardia    Lower extremity edema    Acute metabolic encephalopathy    Obstructive sleep apnea    Morbid obesity with BMI of 60.0-69.9, adult (HCC)    Goals of care, counseling/discussion    Palliative care encounter    Sepsis (HCC) 04/27/2019   H/O insulin dependent diabetes mellitus 04/27/2019   History of CVA with residual deficit 04/27/2019   Seizure  disorder (HCC) 04/27/2019   Decubitus ulcer of sacral region, stage 4 (HCC) 04/27/2019    REFERRING DIAG: Cerebral infarction, unspecified   THERAPY DIAG:  Muscle weakness (generalized)  Other lack of coordination  Difficulty in walking, not elsewhere classified  Unsteadiness on feet  Abnormality of gait and mobility  Other abnormalities of gait and mobility  Rationale for Evaluation and Treatment Rehabilitation  PERTINENT HISTORY: Paul Hall is a 26yoM who presents with severe weakness, quadriparesis, altered sensorium, and visual impairment s/p critical illness and prolonged hospitalization. Pt hospitalized in October 2020 with ARDS 2/2 COVID19 infection. Pt sustained a complex and lengthy hospitalization which included tracheostomy, prolonged sedation, ECMO. In this period pt sustained CVA and SDH. Pt has now been liberated from tracheostomy and G-tube. Pt has since been hospitalized for wound infection and UTI. Pt lives with parents at home, has hospital bed and left chair, hoyer lift transfers, and power WC for mobility needs. Pt needs heavy physical assistance with ADL 2/2 BUE contractures and motor dysfunction   PRECAUTIONS: Fall  SUBJECTIVE:  Pt reported feeling well overall without any issues today.     PAIN:  Are you having pain? No   TODAY'S TREATMENT:  - 03/13/23    *Dependent transfer from w/c to edge of mat using hoyer lift (+2  person assist)  Therapeutic Exercises: (at edge of mat)  Sitting Marches 2x10 each LE LAQ 2x10 each LE  Therapeutic activities: (at edge of mat)  Static standing trials x 3 - All less than 1 min each (max effort from patient and max assist +2 to remain in standing). Pt demonstrated great effort on first and second trials of standing. A BTB was utilized on the third trial to prevent abduction of RLE. He required increased rest break as he was straining hard with standing and can become dizzy at times- None today however.   PATIENT  EDUCATION: Education details: Exercise technique with VC   HOME EXERCISE PROGRAM:  Access Code: [email or web address] URL: [email or web address] Date: 03/23/2022 Prepared by: Maureen Ralphs  Exercises - Supine Bridge  - 3 x weekly - 3 sets - 10 reps - 2 hold - Supine Gluteal Sets  - 3 x weekly - 3 sets - 10 reps - 5 sec hold - Supine Quad Set  - 1 x daily - 3 x weekly - 3 sets - 10 reps - 5 hold - Seated Long Arc Quad  - 1 x daily - 7 x weekly - 3 sets - 10 reps - Seated Hip Adduction Squeeze with Ball  - 1 x daily - 3 x weekly - 3 sets - 10 reps - 5 hold - Seated Hip Abduction  - 1 x daily - 3 x weekly - 3 sets - 10 reps - 2 hold    PT Short Term Goals -       PT SHORT TERM GOAL #1   Title Pt will be independent with HEP in order to improve strength and balance in order to decrease fall risk and improve function at home and work.    Baseline 01/04/2021= No formal HEP in place; 12/12 no HEP in place; 05/10/2021-Patient and his father were able to report compliance with curent HEP consisting of mostly seated/reclined LE strengthening. Both verbalize no questions at this time.    Time 6    Period Weeks    Status Achieved    Target Date 02/15/21            PT LONG TERM GOAL #1     Title Patient will increase BLE gross strength by 1/2 muscle grade to improve functional strength for improved independence with potential gait, increased standing tolerance and increased ADL ability.     Baseline 01/04/2021- Patient presents with 1/5 to 3-/5 B LE strength with MMT; 12/12: goal partially met for Left knee/hip; 05/10/2021= 2-/5 bilateal Hip flex; 3+/5 bilateral Knee ext; 06/21/2021= Patient presents with 2-/5 bilateral Hip flex; 3+/5 bilateral knee ext/flex; 2-/5 left ankle DF; 0/5 right ankle- and able to increase reps and resistance with LE's. 09/15/2021- Patient technically presents with 2-/5 B hip flex/abd/add - but he is able to raise his hip up to approx 100 deg which has improved. 3+/5  Bilateral knee ext, 2-/5 left ankle and 0/5 right ankle.  12/08/2021= Patient able to lift left knee at 110 deg of hip flex; presents with 3+/5 knee ext, 2-/5 left ankle DF and 0/5 right ankle DF, 2-/5 bilateral Hip abd in seated position.    12/6: R: knee 3+/5 ext, 2/5 flexion, left knee 3+/5 extension, 3+/5 flexion, R hip: 2+/5 hip add, 2+/5 hip ABD L hip: 4-/5 hip ABD, 3+/5 hip ADD, 3+/5 hip flexion; 06/06/2022= Patient now presents with 2-/5 right ankle DF/PF;     Time 12     Period Weeks  Status MET    Target Date 03/07/2022         PT LONG TERM GOAL #2    Title Patient will tolerate sitting unsupported demonstrating erect sitting posture for 15 minutes with CGA to demonstrate improved back extensor strength and improved sitting tolerance.     Baseline 01/04/2021- Patient confied to sitting in lift chair or electric power chair with back support and unable to sit upright without physical assistance; 12/12: tolerates <1 minutes upright unsupported sitting. 05/10/2021=static sit with forward trunk lean  in his power wheelchair without back support x approx 3 min. 06/21/2021=Unable to assess today due to patient with acute back pain but on previous visit able to sit x 8 min without back support. 09/15/2021- on last visit- 09/13/2021- patient was able to sit unsupported x 8 min at edge of mat. 10/13/2023 - Patient was able to sit at edge of mat with varying level of assist today from SBA to min A for a total of 20 min. 12/13/2021= Patient demonstrated unsupported sitting at edge of mat for approx 20 min    Time 12     Period Weeks     Status GOAL MET    Target Date 12/08/21          PT LONG TERM GOAL #3    Title Patient will demonstrate ability to perform static standing in // bars > 2 min with Max Assist  without loss of balance and fair posture for improved overall strength for pre-gait and transfer activities.     Baseline 01/04/2021= Patient current uanble to stand- Dependent on hoyer or sit to stand  lift for transfers. 05/10/2021=Not appropriate yet- Currently still dependent with all transfers using hoyer. 06/21/2021= Patient continuing now to focus on LE strengthening to prepare for standing-unable to try today due to acute low back pain-  planning on attempting in new cert period. 09/15/2021- Patient has attempted standing 2x in past two week- max Assist of 2 people - only once was he successful to clearing his bottom from chair - Will continue to be a focus during the new certification. 12/13/2021= Patient has been limited secondary to increased overall low back pain during this certification and will require more time to focus on this goal.  12/6: not assessed this date, will assess at date when 2-3 PTs are present for assistance     Time 12     Period Weeks     Status GOAL not appropriate at this time - may attempt in future once Patient presents with improved overall LE strength.                 PT LONG TERM GOAL #4    Title Pt will improve FOTO score by 10 points or more demonstrating improved perceived functional ability     Baseline FOTO 7 on 10/17; 03/15/21: FOTO 12; 05/10/2021 06/21/2021= 1; 09/15/2021= 9; 12/13/2021= Will issue next visit 12/6: 4; 06/06/2022= Will assess next visit; Goal not appropriate as patient is not ambulatory.     Time 12     Period Weeks     Status WITHDRAWN    Target date 11/30/2022         PT LONG TERM GOAL #5    Title Patient will perform sit to stand transfer with appropriate AD and max assist of 2 people with 75% consistency to prepare for pregait activities.     Baseline 09/15/2021= Patient unable to stand well- unable to clear his bottom off chair  with Max assist of 2 persons. 12/13/2021- Goal not appropriate to try yet but will keep and roll over to next cert as shift continues to focus on transfers/standing; 4/24= Patient able to perform active ankle DF/PF with right LE and able to raise his knee into seated march and clear floor without physical assist today - as  previously unable as well as lift right knee ext to near full ROM to improve strength for eventual standing. 09/07/2022- Goal continues to not be appropriate but patient is now standing some and will keep goal active; 11/23/2022= patient is now performing sit to stand with max A +2 and now able to clear his bottom off mat. 11/30/2022- Patient continues to perform sit to stand transfers with Max A+2 and able to clear bottom well off mat but not erect yet-     Time 12     Period Weeks     Status PROGRESSING    Target date  02/22/2023       PT LONG TERM GOAL #6  Title Patient will tolerate sitting unsupported demonstrating erect sitting posture for 30 minutes with CGA to demonstrate improved back extensor strength and improved sitting tolerance.   Baseline 12/13/2021= Patient demonstrated unsupported sitting at edge of mat for approx 20 min;  12/29/2021- Patient performed approx 30 min of dynamic sitting activities today. 06/06/2022= Patient demonstrated ability to sit and perform static and dynamic UE/LE movement with only Supervision.   Time 12   Period Weeks   Status GOAL MET           7.  Patient will tolerate 2 minutes or more of standing in sit to stand lift or with max assist +2 in order to indicate improved lower extremity weightbearing tolerance for progression to standing in parallel bars. Baseline: 1 minute on most recent stand 02/21/22; 06/06/2022- Patient did attempt today after complaining of right LE pain last week. He attempted 3 stands using sit to stand lift. - all over 1 min- last one approx 48 sec- stopped due to fatigue. More erect standing in lift today - still poor gluteal strength but able to activate glutes and extend hips upon command but unable to hold > 5 sec. 07/28/2022- Will attempt standing next visit but patient has been able to stand since last progress note for up to 3 min at a time at his best. 09/07/2022-  attempted standing with max +2 and patient able to stand 15-20 sec so  will now  revise goal; 11/23/2022- Patient at his best between last 2 visits was able to stand approx 50 sec with max A+2.  11/30/2022- Patient performed 4 trials of static standing - max A +2- clearing bottom and using forearms to bear weight  Goal status: Revised Target date: 02/22/2023   8. Patient will tolerate sitting unsupported demonstrating ability to perform dynamic UE/LE activities for 30 minutes  independently to demonstrate ability to sit at edge of bed to eat or perform some ADL's/exercise for optimal quality of life.             Baseline: 06/06/2022- Patient able to sit and perform some UE/LE exercises but requires CGA at times for safety- he is able to static sit for 30 min with supervision. 07/27/2022= Patient able to now sit > 30 min with Supervision only - performing static and dynamic activities. Will keep goal active to ensure patient is consistent. 09/07/2022=  Patient able to demonstrate consistent ability to sit at edge of mat during visits  Goal Status: MET             Target date: 08/29/2022        Plan     Clinical Impression Statement Patient demonstrated great effort today. Pt was motivated to stand after completing seated therex. Pt stated that he felt that his static standing trials were becoming easier. After the third trial of standing, the pt cleared his chuck pad, which caused the chuck pad to slide off the edge of the mat. The pt being able to clear his chuck pad on the last trial of standing is a positive progression. Pt will continue to benefit from skilled physical therapy intervention to address impairments, improve QOL, and attain therapy goals.    Personal Factors and Comorbidities Comorbidity 3+;Time since onset of injury/illness/exacerbation    Comorbidities CVA, diabetes, Seizures    Examination-Activity Limitations Bathing;Bed Mobility;Bend;Caring for Others;Carry;Dressing;Hygiene/Grooming;Lift;Locomotion Level;Reach Overhead;Self  Feeding;Sit;Squat;Stairs;Stand;Transfers;Toileting    Examination-Participation Restrictions Cleaning;Community Activity;Driving;Laundry;Medication Management;Meal Prep;Occupation;Personal Finances;Shop;Yard Work;Volunteer    Stability/Clinical Decision Making Evolving/Moderate complexity    Rehab Potential Fair    PT Frequency 2x / week    PT Duration 12 weeks    PT Treatment/Interventions ADLs/Self Care Home Management;Cryotherapy;Electrical Stimulation;Moist Heat;Ultrasound;DME Instruction;Gait training;Stair training;Functional mobility training;Therapeutic exercise;Balance training;Patient/family education;Orthotic Fit/Training;Neuromuscular re-education;Wheelchair mobility training;Manual techniques;Passive range of motion;Dry needling;Energy conservation;Taping;Visual/perceptual remediation/compensation;Joint Manipulations    PT Next Visit Plan core strength/motor control in short-sitting, sit to stand if able; Continue with progressive LE Strengthening. Stand activities as appropriate.     PT Home Exercise Plan No changes to HEP today    Consulted and Agree with Plan of Care Patient;Family member/caregiver    Family Member Consulted           [place], SPT  This entire session was performed under direct supervision and direction of a licensed Estate agent . I have personally read, edited and approve of the note as written.    03/09/2023, 8:04 AM      5:15 PM, 03/13/23 Louis Meckel, PT Physical Therapist - [facility]  Outpatient Physical Therapy- Main Campus [phone]

## 2023-03-13 NOTE — Therapy (Incomplete)
Occupational Therapy Neuro Treatment Note   Patient Name: Paul Hall MRN: [id number] DOB:26-Feb-1996, 27 y.o., male Today's Date: 03/13/2023  PCP: Dr. Sherwood Gambler REFERRING PROVIDER: Dr. Sherwood Gambler   OT End of Session - 03/13/23 1624     Visit Number [id number]    Number of Visits 84    Date for OT Re-Evaluation 05/08/23    OT Start Time 1620    OT Stop Time 1700    OT Time Calculation (min) 40 min    Equipment Utilized During Treatment tilt in space power wc    Activity Tolerance Patient tolerated treatment well    Behavior During Therapy WFL for tasks assessed/performed                Past Medical History:  Diagnosis Date   Diabetes mellitus (HCC)    Hypertension    Stroke [facility])    Past Surgical History:  Procedure Laterality Date   IVC FILTER PLACEMENT ([facility] HX)     LEG SURGERY     PEG TUBE PLACEMENT     TRACHEOSTOMY     Patient Active Problem List   Diagnosis Date Noted   Sepsis due to vancomycin resistant Enterococcus species (HCC) 06/06/2019   SIRS (systemic inflammatory response syndrome) (HCC) 06/05/2019   Acute lower UTI 06/05/2019   VRE (vancomycin-resistant Enterococci) infection 06/05/2019   Anemia 06/05/2019   Skin ulcer of sacrum with necrosis of muscle (HCC)    Urinary retention    Type 2 diabetes mellitus without complication, with long-term current use of insulin (HCC)    Tachycardia    Lower extremity edema    Acute metabolic encephalopathy    Obstructive sleep apnea    Morbid obesity with BMI of 60.0-69.9, adult (HCC)    Goals of care, counseling/discussion    Palliative care encounter    Sepsis (HCC) 04/27/2019   H/O insulin dependent diabetes mellitus 04/27/2019   History of CVA with residual deficit 04/27/2019   Seizure disorder (HCC) 04/27/2019   Decubitus ulcer of sacral region, stage 4 (HCC) 04/27/2019   ONSET DATE: 01/2019  REFERRING DIAG: CVA/COVID-19  THERAPY DIAG:  No diagnosis found.  Rationale for Evaluation and Treatment  Rehabilitation  SUBJECTIVE:   SUBJECTIVE STATEMENT:  Pt  reports doing well today  Pt accompanied by: self and family member  PERTINENT HISTORY:  Pt. is a 27 y.o. male who was diagnosed with COVID-19, and CVA with resultant quadriplegia. Pt. Was then hospitalized with VRE UTI. PMHx includes: urinary retention, seizure disorder, obstructive sleep disorder, DM Type II, Morbid obesity.   PRECAUTIONS: None  WEIGHT BEARING RESTRICTIONS No  PAIN:  03/08/23: 0 pain at rest, 5/10 when stretching in BUEs Are you having pain? Reports right  wrist tightness.  FALLS: Has patient fallen in last 6 months? No  LIVING ENVIRONMENT: Lives with: lives with their family Lives in: House/apartment Stairs: No Level Entry Has following equipment at home: Wheelchair (power) and hoyer lift, sit to stand lift  PLOF: Independent  PATIENT GOALS: To be able to engage in more daily care tasks.  OBJECTIVE:   HAND DOMINANCE: Right  ADLs:  Transfers/ambulation related to ADLs: Eating: Pt. reports being able to hold standard utensils, and is starting to engage more in self-feeding tasks, hand to mouth patterns. Pt. Reports that he does as much as he can with the task, and family assists  with the remainder of the task. Grooming: Pt. Is able to initiate holding an electric toothbrush, and brush his teeth. Family assists  LB Dressing: Total Assist UE dressing: Pt. is now able to reach up to actively assist with grasping , and pulling his gown down. Toileting:  Total Assist Bathing: MaxA UB, Total assist LB Tub Shower transfers: N/A Equipment: See above    IADLs: Shopping: Relies on family to assist Light housekeeping: Total Assist Meal Prep: Total Assist Community mobility:   Medication management:  Total Assist  Financial management: N/A Handwriting: Not legible: Pt. Is able to hold a pen with the left hand, and initiate marking the page. Pt.'s eye glasses were not available  MOBILITY STATUS:   Power w/c  POSTURE COMMENTS:  Pt. Requires position changes in his power w/c  ACTIVITY TOLERANCE: Activity tolerance:  Fair  FUNCTIONAL OUTCOME MEASURES: FOTO: 40  TR score: 45  05/25/2022:   FOTO: 50 TR score: 45  07/20/22: FOTO 55  03/08/23: 51  UPPER EXTREMITY ROM     Active ROM Left eval Left  03/21/2022 Left 05/25/2022 L  07/20/22 Left 07/25/2022 Left  08/15/2022 Left  09/26/2022 Left  11/16/2022 Left 12/28/2022 Left 03/13/23  Shoulder flexion 119 123 125 130  136 138 130 142 144  Shoulder abduction 110 115 115 115  118 121 125 142 140  Shoulder adduction            Shoulder extension            Shoulder internal rotation            Shoulder external rotation            Elbow flexion 135(135) 145 145 145  145    145  Elbow extension -27(-18) -21(-20) -20(-16)  -25(-10) -23(-10) -21(-10) -20(-18) -20(-18) -20(-17)  Wrist flexion            Wrist extension 10(50) 19(50) 28(50)  30(50) 35(55) 36(55) 36(60) 40(60) 45(60)  Wrist ulnar deviation            Wrist radial deviation            Wrist pronation            Wrist supination   Limited by flexor tone       10(15)  (Blank rows = not tested)    Active ROM Right eval Right 03/21/2022 Right 05/25/2022 R  07/20/22 Right  07/25/22 Right 08/15/2022 Right 09/26/2022 Right 11/16/2022 Right 12/28/2022 Right 03/13/23  Shoulder flexion 106 scaption 105  Scaption 62 flexion 110 scaption 100  105 105 105 112 Scaption 123 Scaption  Shoulder abduction 114 90 97 100  105 117 120 124 124  Shoulder adduction            Shoulder extension            Shoulder internal rotation            Shoulder external rotation            Elbow flexion 120(130) 145 145 140  144 140 142 148 148  Elbow extension -45(-35) -22(-35) -28(-22)  -32(-21) -32(-28) -28(-26) -28(-28) -27(-26) -27(-40)  Wrist flexion            Wrist extension -30(10) -25(10) -10(30)  -10(20) -10(25) -20(40) -30(10) -22(34) -22(35)  Wrist ulnar deviation             Wrist radial deviation            Wrist pronation            Wrist supination   Limited by flexor tone  12/28/2022: Thumb radial abduction: Right: 12(46), Left: 40(54)  Digits: Bilateral 2nd through 5th digit: MP Hyperextension with PIP, an DIP flexion     UPPER EXTREMITY MMT:     Left eval Left 03/21/2022 Left  05/25/2022 L 07/20/22 Left  08/15/2022 Left 09/26/2022 Left 11/16/2022 Left 12/28/2022 Left  03/13/2023  Shoulder flexion 3-/5 3/5 3+/5 4+/5 4+/5 4+/5 4+/5 4+/5 4+/5  Shoulder abduction 3-/5 3-/5 4-/5 4+/5 4+/5 4+5 4+/5 4+/5 4+/5  Shoulder adduction           Shoulder extension           Shoulder internal rotation           Shoulder external rotation           Elbow flexion 3/5 3+/5 4-/5 4+/5 5/5 5/5 5/5 5/5 5/5  Elbow extension 2-/5 2/5  4+/5 4+/5 4+/5  4/5 4/5  Wrist flexion           Wrist extension 2/5 2+/5 3-/5  3-/5 3-/5 3-/5 3/5 3/5  Wrist ulnar deviation           Wrist radial deviation           Wrist pronation           Wrist supination           (Blank rows = not tested)  Right eval Right 03/21/2022 Right  05/25/2022 R 07/20/22 Right 08/15/2022 Right 09/26/2022 Right 11/16/2022 Right 12/28/2022 Right 03/13/2023  Shoulder flexion 3-/5 scaption 3-/5 3-/5 3+/5 3+/5 3+/5 3/5 3/5 3/5  Shoulder abduction 3-/5 3-/5 3-/5 4/5 4/5 4/5 4/5 4/5 4/5  Shoulder adduction           Shoulder extension           Shoulder internal rotation           Shoulder external rotation           Elbow flexion 3/5 3/5 3+/5 4/5 4+/5 4+/5 4+/5 4+/5 4+/5  Elbow extension 2-/5 2/5 3-/5 4+/5 4+/5 4+/5 4+/5 4-/5 4-/5  Wrist flexion           Wrist extension 2-/5 2-/5 2/5 2/5 2/5 2/5 2/5 2/5 2/5  Wrist ulnar deviation           Wrist radial deviation           Wrist pronation           Wrist supination             HAND FUNCTION:  Grip strength: Right: 0 lbs; Left: 0 lbs and Lateral pinch: Right: 5 lbs, Left: 2 lbs  03/21/2022: Lateral pinch: Right: 3.5 lbs, Left: 2  lbs  07/20/22: Grip strength: Right: 3 lbs; Left: 4 lbs and Lateral pinch: Right: 5 lbs, Left: 3 lbs  08/15/2022:  Grip strength: Right: 0 lbs; Left: 5 lbs and Lateral pinch: Right: 2 lbs, Left: 4 lbs  09/26/2022  Grip strength: Right: 0 lbs; Left: 8 lbs and Lateral pinch: Right: 2 lbs, Left: 5 lbs  11/16/2022  Grip strength: Right: 0 lbs; Left: 8 lbs  9/25/20204  Grip strength: Right: 0 lbs; Left: 8 lbs  Grip strength: Right: 0 lbs; Left: 8 lbs   03/08/23:  Grip strength: Right: 0 lbs without PROM, 5 lbs after PROM, Left 8 lbs  Bilateral digit PIP/DIP flexion contractures with MP hyperextension with attempts for AROM. Pt. is able to tolerate AROM to the bilateral digits at the initial evaluation however, has a history of pain  in the digits.  COORDINATION: Eval: Pt. is unable to grasp 9-hole test pegs. Pt. is able to initiate grasping larger pegs, and is able to hold a pen in the left hand.  07/20/22: 2 min 36 seconds to remove 9 pegs from 9 hole peg test - cues to locate pegs 2/2 low vision. Pt. is able to initiate grasping larger pegs on R hand and is able to hold a pen in the left hand.  08/15/2022:    Vision, and sensation limiting accuracy of 9 hole peg test results. Pt. Was able to grasp and remove vertical pegs intermittently with cues.    SENSATION: Light touch: Impaired   EDEMA:  N/A  MUSCLE TONE: BUE flexor Spasticity  COGNITION Overall cognitive status: Continue to assess in functional context  VISION:   Subjective report: Pt. was not wearing glasses at the time of the initial eval.  Baseline vision: Vision is very limited. Wears glasses all the time Visual history: History of impaired vision following CVA. Pt. Has received [facility] through the [facility] low vision rehabilitation program.   VISION ASSESSMENT: Impaired To be further assessed in functional context  PERCEPTION: Impaired   PRAXIS: Impaired: motor planning  OBSERVATIONS:  Pt reports being on  Tramadol   TODAY'S TREATMENT    ROM Measurements were obtained.   There. Ex.:  Pt. tolerated AROM followed by PROM for the bilateral UEs.  PATIENT EDUCATION: Education details: progress towards goals Person educated: Patient and Parent Education method: Explanation Education comprehension: verbalized understanding  HOME EXERCISE PROGRAM:  Continue ongoing assessment, and continue to provide as needed.   GOALS: Goals reviewed with patient? Yes  SHORT TERM GOALS: Target date: 11/07/2022   To assess splint fit, and make appropriate adjustments to promote good skin integrity through the palmar surface of the bilateral hands.  Baseline: 05/25/22: Goal currently met, however ongoing as needs to assess splint fit arise. 03/23/2022: Pt. is wearing splints a couple of hours at night bilateral resting hand splints. 03/21/2022: Pt. is wearing splints a couple of hours at night bilateral resting hand splints. Goal status: Deferred   LONG TERM GOALS: Target date: 05/08/2023    FOTO score will Improve by 2 points for Pt. perceived improvement with the assessment specific ADL/IADL tasks.  Baseline:  03/08/23: 51; 12/28/2022: FOTO score: 56; 11/16/2022: 52 09/27/2022: 51 07/20/2022: FOTO 55 05/25/2022: FOTO score: 50,  TR score: 45 Eval: FOTO score: 40,  TR score: 45 Goal status: Achieved   2.   Pt. will independently perform oral care for 100% of the task after complete set-up. Baseline: 03/08/23: not as much assist required proximally, but to adjust positioning of toothbrush; 02/13/23: Pt. Continues to require assist proximally 01/09/2023: Continue 12/28/2022:  Pt. Continues to complete 90% of the task with complete set-up, with visual cues, and occassional assist of the right elbow. 11/16/2022: Pt. Completes 90% of the task with complete set-up. 09/26/2022: Completes 90% of the task, proximal assist at times required at the elbow.  08/15/2022: Completes 90% of the task, proximal assist at times required  at the elbow. 07/20/22: completes 90% of task, limited by shoulder flexion. 05/25/2022:  Pt. Is able to initiate and perform oral care for approximately 90% of the task. Complete set-up required. Assi needed only for the very back teeth. 03/23/2022: Pt. Is able to initiate and perform oral care for approximately 75% of the task. Pt. Requires assist at proximally at the elbow for through oral care. 03/21/2022: Pt. Is able to initiate and  perform oral care for approximately 75% of the task. Pt. Requires assist at proximally at the elbow for through oral care. Eval: Pt. is able to initiate using an electric toothbrush. Pt. requires assist for set-up, and assist for thoroughness, and as he Pt. fatigues. Goal status: Ongoing  3.  Pt. Will be modified independence with self-feeding for 100% using a swivel spoon, and standard fork after complete set-up Baseline: 03/08/23: indep with cup with a handle and lid, assist to reposition eating utensils in hand;  02/13/23:  Pt. Is now able to consistently, and efficiently use a swivel spoon for eating cereal with the left hand.01/09/2023: Continue 12/28/2022: Pt. Is now feeding himself 90% of the time using a swivel spoon with the left hand, and 95% of the time with the fork. Pt. Continues to have difficulty with cutting food. 11/16/2022: 100% with finger foods, Pt. Is initiating with a swivel spoon, and universal cuff for fork use, however requires assist for consistency, and accuracy.  09/26/2022: 90% using the spoon, assist at time with the forearm motion, and grip on the utensils. 100% for finger foods.  08/15/2022:  90% using the spoon, assit at time with the forearm motion, and grip on the utensils. 100% for finger foods-independent with set-up- unsupervised.  07/20/22: self-feeds cereal using spoon 90% of task. 05/25/2022: Pt. Is able to use a spoon to scoop cereal when feeding himself cereal 85% of the time. Pt. Is able to feed himself snack/finger foods 100% of the time. Pt.  Continues to work on consistency of  stabilizing a cup/mug when drinking. Pt. Is able to grasp a water bottle with assist initially, with assist tapering off as he drinks.03/23/2022: Pt. Is able to perform scooping cereal for 75% of the time. Pt. required assist, and support at the left elbow, and Pt. Presents with limited forearm supination when using the spoon, and bringing it towards his mouth. Pt. Is able to use a fork to spear items, and perform the hand to mouth pattern.  03/21/2022: Pt. Is able to perform scooping cereal for 75% of the time. Pt. required assist, and support at the left elbow, and Pt. presents with limited forearm supination when using the spoon, and bringing it towards his mouth. Pt. Is able to use a fork to spear items, and perform the hand to mouth pattern.  Eval: Pt. is able to hold standard standard utensils. Pt. Performs as much of the task as he, can and has assistance for the remainder. Goal status:   Ongoing  4.  Pt. Will improve grasp patterns and consistently grasp 1/4" objects for ADL, and IADL tasks.  Baseline: 03/08/23: 1/2" objects efficiently; 02/13/2023: 1/2" objects efficiently 01/09/2023: Continue 12/28/2022: Continues to grasp 1/4" pegs with min a + visual cues, consistently grasping 1/2" objects with visual cues 11/16/2022: Continues to grasp 1/4" pegs with min a + visual cues, consistently grasping 1/2" objects with visual cues 09/26/2022: Continues to grasp 1/4" pegs with min a + visual cues, consistently grasping 1/2" objects with visual cues. 08/15/2022: grasps 1/4" pegs with min a + visual cues, consistently grasping 1/2" objects with visual cues. 07/20/22: grasps 1/4" pegs with min a + visual cues, consistently grasping 1/2" objects with visual cues. 05/25/2022: Pt. Is working on improving consistency of grasping 1/2" objects with visual cues.  03/23/2022: Pt. Is able to grasp 1" objects consistently,and continues to work on the hand patterns needed to grasp 1/2"  objects.03/21/2022: Pt. Is able to grasp 1" objects consistently,and continues to  work on the hand patterns needed to grasp 1/2" objects. Eval: Pt. is able to grasp 1" objects intermittently using a lateral grasp pattern. Goal status: Ongoing  5.  Pt. will independently write his name legibly with letter sizes under 1". Baseline: 03/08/23: Not addressed this period; 02/13/2023: Continue 01/09/2023: Continue. 12/28/2022: Pt. is now using an IPad Pen. 11/16/2022: Pt. Continues to be able to write name with smaller letter size. Pt. Is signing name with a computer styus. Pt. Requires visual cues, and assist 09/26/2022: Pt. Continues to be able to write name with smaller letter size. Pt. Is signing name with a computer styus. Pt. Requires visual cues, and assist. 08/15/2022: Pt. Is able to write name with smaller letter size. Pt. Is signing name with a computer styus. Pt. Requires visual cues, and assist. 07/20/22: stabilizing assist to write name with 75% legibility with 2" letters. 05/25/2022: Pt. to continue to work towards formulating grasp patterns in preparation for grasping a large width pen. Pt. Requires visual cues. 03/23/2022: Pt. Is able to write his name with modA, however has difficulty with formulating letter sizes less than 2" in size with 50% legibility for the 3 letters of his name.03/21/2022: Pt. Is able to write his name with modA, however has difficulty with formulating letter sizes less than 2" in size with 50% legibility for the 3 letters of his name. Eval: Pt is able to hold a thin marker with his left hand, and formulate a line, and initiate a circular pattern (Pt. without glasses today) Goal status: Ongoing  6. Pt. Will reach up to comb/brush his hair with minA.  Baseline: 03/08/23: Pt uses pick in L hand to reach 75% of his head; unable to reach R side of head; 02/13/2023:Pt. is able to use a hair pick with the left hand on the right side of his head, top, and back of the head. Pt. Continues to  have difficulty reaching further to the right side of his head with the left. 01/09/2023: Continue 12/28/2022: Pt. Is progressing, and is now able to use a hair pick with the left hand on the right side of his head, top, and back of the head. Pt. Continues to have difficulty reaching further to the right side of his head with the left. 11/16/2022: Pt. Is able to use a hair pick for the left side of his head using his left hand. Pt. Presents with difficulty reaching to the right side of his head.09/26/2022: Pt. Is able to use a hair pick for the left side of his head using his left hand. Pt. Presents with difficulty reaching to the right side of his head. 5/13/202: 75% using a hair pick, assist to the right side of the head. 07/20/22: reaches 75% of head, assist for far R side of head, fatigues quickly. 05/25/2022: Pt. Is able to reach up with the left hand to the left side, top, and back of his head. 03/23/2022: Pt. is now able to more consistently initiate reaching up to his head with his left hand in preparation for haircare12/18/2023: Pt. is now able to more consistently initiate reaching up to his head with his left hand in preparation for haircare. Eval: Pt. is able to initiate reaching up for hair care with a long handled brush, however is unable to sustain UE's in elevation to perform the task.     Goal status: Ongoing  7. Pt. Will independently navigate the w/c through his environment with minA with visual scanning, and hand placement  on the controls.  Baseline: 11/16/2022: Deferred 2/2 vision 09/26/2022: Pt. Is now navigating his w/c ouside the home, and around his block. 08/15/2022: Pt. Requires minA to setup hand on controls and MIN cues to navigate the w/c in wide spaces, requires MIN - MOD cues to navigate the w/c through more narrow doorways, and tighter turns - varies based on good vs bad vision days.07/20/22: Pt. Requires minA to setup hand on controls and MIN cues to navigate the w/c in wide spaces,  requires MIN - MOD cues to navigate the w/c through more narrow doorways, and tighter turns - varies based on good vs bad vision days. 05/25/2022: Pt. Requires minA  and  cues to navigate the w/c in wide spaces, and requires Mod cues to navigate the w/c through more narrow doorways, and tighter turns. Pt. Requires max cues for scanning through the environment, and moderate cues for hand placement on the controls.     Goal status: Deferred 2/2 vision    8. Pt. Will improve bilateral grip strength to be able to independently grasp, and pull up blankets, and linens.   Baseline: 03/08/23: R grip 0-5 lbs, L grip 8 lbs; pt reports he can consistently pull up blankets and linens independently.  02/13/2023: Continue. Pt. presents with digit PIP, and DIP flexor tightness. 01/09/2023: Continue 12/28/2023: R: 0#, L: 5# Pt. Is now able to more consistently grasp and pull blankets up over him. 11/16/2022: R: 0 L: 8# Pt. Is more consistently grasping his blankets and attempting to pull them up. 09/26/2022: R: 0  L: 8# Pt. is attempting to grasp, and pull blankets up more, using mostly the left hand. 08/15/2022: Pt. has difficulty securely holding, and pulling up blankets, and linens.    Goal status: Ongoing  9. Pt. will consistently actively control the releasing of blankets, covers, and linens from his hands once they are in the desired position over him. Baseline:12/28/2022: Pt. Is consistently actively able to release linens once they are in the desired position over him. 11/16/2022: Pt. continues to improve with actively releasing blankets form his hands. 09/26/2022: Pt. is improving with actively releasing blankets form his hands. 08/15/2022: Pt. has difficulty with controlled releasing of blankets/linens from his grasp.     Goal status: Achieved    10. Pt. Will improve bilateral UE strength by 2 MM grades to assist with ADLs, and IADLs  Baseline: 03/13/2023: Right: shoulder flexion: 3/5, abduction: 4/5, elbow flexion:  4+/5 , extension: 4-/5 wrist extension: 2/5; Left: shoulder flexion: 4+/5, abduction: 4+/5, elbow flexion: 5/5 , extension: 4/5  wrist extension: 3/5 03/08/23: NT this date d/t as pt arrived late; 02/13/2023: Continue 01/09/2023: Continue 12/28/2022: Right: shoulder flexion: 3/5, abduction: 4/5, elbow flexion: 4+/5 , extension: 4-/5 wrist extension: 2/5; Left: shoulder flexion: 4+/5, abduction: 4+/5, elbow flexion: 5/5 , extension: 4/5  wrist extension: 3/5 Right: shoulder flexion: 3+/5, abduction: 4/5, elbow flexion: 4+/5 , extension: 4+/5 wrist extension: 2/5; Left: shoulder flexion: 4+/5, abduction: 4+/5, elbow flexion: 5/5 , extension: 4+/5 wrist extension: 3-/5 11/16/2022: Right: shoulder flexion: 3/5, abduction: 4/5, elbow flexion: 4+/5 , extension: 4+/5 wrist extension: 2/5; Left: shoulder flexion: 4+/5, abduction: 4+/5, elbow flexion: 5/5 , extension:  wrist extension: 3-/5 Right: shoulder flexion: 3+/5, abduction: 4/5, elbow flexion: 4+/5 , extension: 4+/5 wrist extension: 2/5; Left: shoulder flexion: 4+/5, abduction: 4+/5, elbow flexion: 5/5 , extension: 4+/5 wrist extension: 3-/5 Goal status: Ongoing  11. Pt. Will improve right wrist extension by 10 degrees to initiate reaching for items in  preparation for ADLs. Baseline: 03/13/23: -22(35) 03/08/23: NT this date as pt arrived late; 02/13/2023: Continue 01/09/2023: Continue 12/28/2022: -22(34) 11/16/2022: -30(10)    Goal status: Progressing   ASSESSMENT:  CLINICAL IMPRESSION:  ROM measurements were obtained. Pt. has made improvements with left shoulder flexion, elbow extension, forearm supination, as well as right shoulder scaption, and  wrist extension. Pt. presents with decreased right elbow extension this reporting period 2/2 increased flexor synergistic tone, and tightness. Pt. Also presents with bilateral 2nd-5th digit PIP, and DIP flexor tightness. Pt. is due to have Botox, and is scheduled for next month. Pt. is improving with grasping items  needed for ADLs. Pt. continues to be highly motivated to continue working towards improving BUE functioning, ROM, strength, motor control, [facility] skills, as well as visual compensatory strategies in order to maximize independence with daily ADLs, and IADL functioning.      PERFORMANCE DEFICITS in functional skills including ADLs, IADLs, coordination, dexterity, proprioception, ROM, strength, pain, FMC, GMC, decreased knowledge of use of DME, and UE functional use, cognitive skills including safety awareness, and psychosocial skills including coping strategies, environmental adaptation, habits, and routines and behaviors.   IMPAIRMENTS are limiting patient from ADLs, IADLs, leisure, and social participation.   COMORBIDITIES may have co-morbidities  that affects occupational performance. Patient will benefit from skilled OT to address above impairments and improve overall function.  MODIFICATION OR ASSISTANCE TO COMPLETE EVALUATION: Maximum or significant modification of tasks or assist is necessary to complete an evaluation.  OT OCCUPATIONAL PROFILE AND HISTORY: Comprehensive assessment: Review of records and extensive additional review of physical, cognitive, psychosocial history related to current functional performance.  CLINICAL DECISION MAKING: High - multiple treatment options, significant modification of task necessary  REHAB POTENTIAL: Fair    EVALUATION COMPLEXITY: High    PLAN: OT FREQUENCY: 2 x's a week  OT DURATION:12 weeks  PLANNED INTERVENTIONS: self care/ADL training, therapeutic exercise, therapeutic activity, neuromuscular re-education, manual therapy, passive range of motion, patient/family education, and cognitive remediation/compensation RECOMMENDED OTHER SERVICES: PT  CONSULTED AND AGREED WITH PLAN OF CARE: Patient and family member/caregiver  PLAN FOR NEXT SESSION: Review HEP  Olegario Messier, MS, OTR/L  03/13/2023

## 2023-03-15 ENCOUNTER — Ambulatory Visit: Payer: BC Managed Care – PPO | Admitting: Occupational Therapy

## 2023-03-15 ENCOUNTER — Ambulatory Visit: Payer: BC Managed Care – PPO

## 2023-03-15 DIAGNOSIS — R262 Difficulty in walking, not elsewhere classified: Secondary | ICD-10-CM

## 2023-03-15 DIAGNOSIS — R278 Other lack of coordination: Secondary | ICD-10-CM | POA: Diagnosis not present

## 2023-03-15 DIAGNOSIS — R2681 Unsteadiness on feet: Secondary | ICD-10-CM | POA: Diagnosis not present

## 2023-03-15 DIAGNOSIS — M6281 Muscle weakness (generalized): Secondary | ICD-10-CM

## 2023-03-15 DIAGNOSIS — I6930 Unspecified sequelae of cerebral infarction: Secondary | ICD-10-CM | POA: Diagnosis not present

## 2023-03-15 DIAGNOSIS — R2689 Other abnormalities of gait and mobility: Secondary | ICD-10-CM

## 2023-03-15 DIAGNOSIS — R269 Unspecified abnormalities of gait and mobility: Secondary | ICD-10-CM | POA: Diagnosis not present

## 2023-03-15 NOTE — Therapy (Signed)
Occupational Therapy Neuro Treatment Note   Patient Name: Paul Hall MRN: [id number] DOB:Jul 21, 1995, 27 y.o., male Today's Date: 03/15/2023  PCP: Dr. Sherwood Gambler REFERRING PROVIDER: Dr. Sherwood Gambler   OT End of Session - 03/15/23 1731     Visit Number [id number]    Number of Visits 84    Date for OT Re-Evaluation 05/08/23    Authorization Type 09/26/2022    OT Start Time 1625    OT Stop Time 1715    OT Time Calculation (min) 50 min    Equipment Utilized During Treatment tilt in space power wc    Activity Tolerance Patient tolerated treatment well    Behavior During Therapy WFL for tasks assessed/performed                Past Medical History:  Diagnosis Date   Diabetes mellitus (HCC)    Hypertension    Stroke [facility])    Past Surgical History:  Procedure Laterality Date   IVC FILTER PLACEMENT ([facility] HX)     LEG SURGERY     PEG TUBE PLACEMENT     TRACHEOSTOMY     Patient Active Problem List   Diagnosis Date Noted   Sepsis due to vancomycin resistant Enterococcus species (HCC) 06/06/2019   SIRS (systemic inflammatory response syndrome) (HCC) 06/05/2019   Acute lower UTI 06/05/2019   VRE (vancomycin-resistant Enterococci) infection 06/05/2019   Anemia 06/05/2019   Skin ulcer of sacrum with necrosis of muscle (HCC)    Urinary retention    Type 2 diabetes mellitus without complication, with long-term current use of insulin (HCC)    Tachycardia    Lower extremity edema    Acute metabolic encephalopathy    Obstructive sleep apnea    Morbid obesity with BMI of 60.0-69.9, adult (HCC)    Goals of care, counseling/discussion    Palliative care encounter    Sepsis (HCC) 04/27/2019   H/O insulin dependent diabetes mellitus 04/27/2019   History of CVA with residual deficit 04/27/2019   Seizure disorder (HCC) 04/27/2019   Decubitus ulcer of sacral region, stage 4 (HCC) 04/27/2019   ONSET DATE: 01/2019  REFERRING DIAG: CVA/COVID-19  THERAPY DIAG:  Muscle weakness  (generalized)  Rationale for Evaluation and Treatment Rehabilitation  SUBJECTIVE:   SUBJECTIVE STATEMENT:  Pt  reports doing well today. Pt. Reports that his right side felt a little off when attempting to stand today.  Pt accompanied by: self and family member  PERTINENT HISTORY:  Pt. is a 27 y.o. male who was diagnosed with COVID-19, and CVA with resultant quadriplegia. Pt. Was then hospitalized with VRE UTI. PMHx includes: urinary retention, seizure disorder, obstructive sleep disorder, DM Type II, Morbid obesity.   PRECAUTIONS: None  WEIGHT BEARING RESTRICTIONS No  PAIN:  03/08/23: 0 pain at rest, 5/10 when stretching in BUEs Are you having pain? Reports right  wrist tightness.  FALLS: Has patient fallen in last 6 months? No  LIVING ENVIRONMENT: Lives with: lives with their family Lives in: House/apartment Stairs: No Level Entry Has following equipment at home: Wheelchair (power) and hoyer lift, sit to stand lift  PLOF: Independent  PATIENT GOALS: To be able to engage in more daily care tasks.  OBJECTIVE:   HAND DOMINANCE: Right  ADLs:  Transfers/ambulation related to ADLs: Eating: Pt. reports being able to hold standard utensils, and is starting to engage more in self-feeding tasks, hand to mouth patterns. Pt. Reports that he does as much as he can with the task, and family assists  with  the remainder of the task. Grooming: Pt. Is able to initiate holding an electric toothbrush, and brush his teeth. Family assists LB Dressing: Total Assist UE dressing: Pt. is now able to reach up to actively assist with grasping , and pulling his gown down. Toileting:  Total Assist Bathing: MaxA UB, Total assist LB Tub Shower transfers: N/A Equipment: See above    IADLs: Shopping: Relies on family to assist Light housekeeping: Total Assist Meal Prep: Total Assist Community mobility:   Medication management:  Total Assist  Financial management: N/A Handwriting: Not  legible: Pt. Is able to hold a pen with the left hand, and initiate marking the page. Pt.'s eye glasses were not available  MOBILITY STATUS:  Power w/c  POSTURE COMMENTS:  Pt. Requires position changes in his power w/c  ACTIVITY TOLERANCE: Activity tolerance:  Fair  FUNCTIONAL OUTCOME MEASURES: FOTO: 40  TR score: 45  05/25/2022:   FOTO: 50 TR score: 45  07/20/22: FOTO 55  03/08/23: 51  UPPER EXTREMITY ROM     Active ROM Left eval Left  03/21/2022 Left 05/25/2022 L  07/20/22 Left 07/25/2022 Left  08/15/2022 Left  09/26/2022 Left  11/16/2022 Left 12/28/2022 Left 03/13/23  Shoulder flexion 119 123 125 130  136 138 130 142 144  Shoulder abduction 110 115 115 115  118 121 125 142 140  Shoulder adduction            Shoulder extension            Shoulder internal rotation            Shoulder external rotation            Elbow flexion 135(135) 145 145 145  145    145  Elbow extension -27(-18) -21(-20) -20(-16)  -25(-10) -23(-10) -21(-10) -20(-18) -20(-18) -20(-17)  Wrist flexion            Wrist extension 10(50) 19(50) 28(50)  30(50) 35(55) 36(55) 36(60) 40(60) 45(60)  Wrist ulnar deviation            Wrist radial deviation            Wrist pronation            Wrist supination   Limited by flexor tone       10(15)  (Blank rows = not tested)    Active ROM Right eval Right 03/21/2022 Right 05/25/2022 R  07/20/22 Right  07/25/22 Right 08/15/2022 Right 09/26/2022 Right 11/16/2022 Right 12/28/2022 Right 03/13/23  Shoulder flexion 106 scaption 105  Scaption 62 flexion 110 scaption 100  105 105 105 112 Scaption 123 Scaption  Shoulder abduction 114 90 97 100  105 117 120 124 124  Shoulder adduction            Shoulder extension            Shoulder internal rotation            Shoulder external rotation            Elbow flexion 120(130) 145 145 140  144 140 142 148 148  Elbow extension -45(-35) -22(-35) -28(-22)  -32(-21) -32(-28) -28(-26) -28(-28) -27(-26) -27(-40)  Wrist  flexion            Wrist extension -30(10) -25(10) -10(30)  -10(20) -10(25) -20(40) -30(10) -22(34) -22(35)  Wrist ulnar deviation            Wrist radial deviation            Wrist pronation  Wrist supination   Limited by flexor tone           12/28/2022: Thumb radial abduction: Right: 12(46), Left: 40(54)  Digits: Bilateral 2nd through 5th digit: MP Hyperextension with PIP, an DIP flexion     UPPER EXTREMITY MMT:     Left eval Left 03/21/2022 Left  05/25/2022 L 07/20/22 Left  08/15/2022 Left 09/26/2022 Left 11/16/2022 Left 12/28/2022 Left  03/13/2023  Shoulder flexion 3-/5 3/5 3+/5 4+/5 4+/5 4+/5 4+/5 4+/5 4+/5  Shoulder abduction 3-/5 3-/5 4-/5 4+/5 4+/5 4+5 4+/5 4+/5 4+/5  Shoulder adduction           Shoulder extension           Shoulder internal rotation           Shoulder external rotation           Elbow flexion 3/5 3+/5 4-/5 4+/5 5/5 5/5 5/5 5/5 5/5  Elbow extension 2-/5 2/5  4+/5 4+/5 4+/5  4/5 4/5  Wrist flexion           Wrist extension 2/5 2+/5 3-/5  3-/5 3-/5 3-/5 3/5 3/5  Wrist ulnar deviation           Wrist radial deviation           Wrist pronation           Wrist supination           (Blank rows = not tested)  Right eval Right 03/21/2022 Right  05/25/2022 R 07/20/22 Right 08/15/2022 Right 09/26/2022 Right 11/16/2022 Right 12/28/2022 Right 03/13/2023  Shoulder flexion 3-/5 scaption 3-/5 3-/5 3+/5 3+/5 3+/5 3/5 3/5 3/5  Shoulder abduction 3-/5 3-/5 3-/5 4/5 4/5 4/5 4/5 4/5 4/5  Shoulder adduction           Shoulder extension           Shoulder internal rotation           Shoulder external rotation           Elbow flexion 3/5 3/5 3+/5 4/5 4+/5 4+/5 4+/5 4+/5 4+/5  Elbow extension 2-/5 2/5 3-/5 4+/5 4+/5 4+/5 4+/5 4-/5 4-/5  Wrist flexion           Wrist extension 2-/5 2-/5 2/5 2/5 2/5 2/5 2/5 2/5 2/5  Wrist ulnar deviation           Wrist radial deviation           Wrist pronation           Wrist supination             HAND FUNCTION:  Grip  strength: Right: 0 lbs; Left: 0 lbs and Lateral pinch: Right: 5 lbs, Left: 2 lbs  03/21/2022: Lateral pinch: Right: 3.5 lbs, Left: 2 lbs  07/20/22: Grip strength: Right: 3 lbs; Left: 4 lbs and Lateral pinch: Right: 5 lbs, Left: 3 lbs  08/15/2022:  Grip strength: Right: 0 lbs; Left: 5 lbs and Lateral pinch: Right: 2 lbs, Left: 4 lbs  09/26/2022  Grip strength: Right: 0 lbs; Left: 8 lbs and Lateral pinch: Right: 2 lbs, Left: 5 lbs  11/16/2022  Grip strength: Right: 0 lbs; Left: 8 lbs  9/25/20204  Grip strength: Right: 0 lbs; Left: 8 lbs  Grip strength: Right: 0 lbs; Left: 8 lbs   03/08/23:  Grip strength: Right: 0 lbs without PROM, 5 lbs after PROM, Left 8 lbs  Bilateral digit PIP/DIP flexion contractures with MP hyperextension with attempts for AROM. Pt. is  able to tolerate AROM to the bilateral digits at the initial evaluation however, has a history of pain in the digits.  COORDINATION: Eval: Pt. is unable to grasp 9-hole test pegs. Pt. is able to initiate grasping larger pegs, and is able to hold a pen in the left hand.  07/20/22: 2 min 36 seconds to remove 9 pegs from 9 hole peg test - cues to locate pegs 2/2 low vision. Pt. is able to initiate grasping larger pegs on R hand and is able to hold a pen in the left hand.  08/15/2022:    Vision, and sensation limiting accuracy of 9 hole peg test results. Pt. Was able to grasp and remove vertical pegs intermittently with cues.    SENSATION: Light touch: Impaired   EDEMA:  N/A  MUSCLE TONE: BUE flexor Spasticity  COGNITION Overall cognitive status: Continue to assess in functional context  VISION:   Subjective report: Pt. was not wearing glasses at the time of the initial eval.  Baseline vision: Vision is very limited. Wears glasses all the time Visual history: History of impaired vision following CVA. Pt. Has received treatment through the [facility] low vision rehabilitation program.   VISION ASSESSMENT: Impaired To be  further assessed in functional context  PERCEPTION: Impaired   PRAXIS: Impaired: motor planning  OBSERVATIONS:  Pt reports being on Tramadol   TODAY'S TREATMENT   Therapeutic Ex.:   Pt. performed AROM for right shoulder flexion, abduction. Emphasis was placed on slow prolonged gentle passive stretching for RUE, forearm supination, bilateral wrist extension, wrist extension, digit MP, PIP, and DIP extension following moist heat. ROM was performed to decrease stiffness, and prepare the RUE for functional use. ROM was performed following moist heat to the shoulder right elbow, forearm, wrist, and digits. PROM for bilateral digit MP, PIP, and DIP extension in preparation for placing them onto a flat surface.  Neuromuscular re-education:  The Box  and Blocks was administered: R: 2 cubes completed in 1 min. & 4 blocks on the left completed in 1 min.   Pt. worked on grasping 1 " cubes with the right hand and reaching to place them over the wall. Pt. worked on moving them right to left over the wall, as well as increasing the challenge to extend the right elbow, and place them over the wall in front of him.      PATIENT EDUCATION: Education details: progress towards goals Person educated: Patient and Parent Education method: Explanation Education comprehension: verbalized understanding  HOME EXERCISE PROGRAM:  Continue ongoing assessment, and continue to provide as needed.   GOALS: Goals reviewed with patient? Yes  SHORT TERM GOALS: Target date: 11/07/2022   To assess splint fit, and make appropriate adjustments to promote good skin integrity through the palmar surface of the bilateral hands.  Baseline: 05/25/22: Goal currently met, however ongoing as needs to assess splint fit arise. 03/23/2022: Pt. is wearing splints a couple of hours at night bilateral resting hand splints. 03/21/2022: Pt. is wearing splints a couple of hours at night bilateral resting hand splints. Goal status:  Deferred   LONG TERM GOALS: Target date: 05/08/2023    FOTO score will Improve by 2 points for Pt. perceived improvement with the assessment specific ADL/IADL tasks.  Baseline:  03/08/23: 51; 12/28/2022: FOTO score: 56; 11/16/2022: 52 09/27/2022: 51 07/20/2022: FOTO 55 05/25/2022: FOTO score: 50,  TR score: 45 Eval: FOTO score: 40,  TR score: 45 Goal status: Achieved   2.   Pt. will independently  perform oral care for 100% of the task after complete set-up. Baseline: 03/08/23: not as much assist required proximally, but to adjust positioning of toothbrush; 02/13/23: Pt. Continues to require assist proximally 01/09/2023: Continue 12/28/2022:  Pt. Continues to complete 90% of the task with complete set-up, with visual cues, and occassional assist of the right elbow. 11/16/2022: Pt. Completes 90% of the task with complete set-up. 09/26/2022: Completes 90% of the task, proximal assist at times required at the elbow.  08/15/2022: Completes 90% of the task, proximal assist at times required at the elbow. 07/20/22: completes 90% of task, limited by shoulder flexion. 05/25/2022:  Pt. Is able to initiate and perform oral care for approximately 90% of the task. Complete set-up required. Assi needed only for the very back teeth. 03/23/2022: Pt. Is able to initiate and perform oral care for approximately 75% of the task. Pt. Requires assist at proximally at the elbow for through oral care. 03/21/2022: Pt. Is able to initiate and perform oral care for approximately 75% of the task. Pt. Requires assist at proximally at the elbow for through oral care. Eval: Pt. is able to initiate using an electric toothbrush. Pt. requires assist for set-up, and assist for thoroughness, and as he Pt. fatigues. Goal status: Ongoing  3.  Pt. Will be modified independence with self-feeding for 100% using a swivel spoon, and standard fork after complete set-up Baseline: 03/08/23: indep with cup with a handle and lid, assist to reposition eating  utensils in hand;  02/13/23:  Pt. Is now able to consistently, and efficiently use a swivel spoon for eating cereal with the left hand.01/09/2023: Continue 12/28/2022: Pt. Is now feeding himself 90% of the time using a swivel spoon with the left hand, and 95% of the time with the fork. Pt. Continues to have difficulty with cutting food. 11/16/2022: 100% with finger foods, Pt. Is initiating with a swivel spoon, and universal cuff for fork use, however requires assist for consistency, and accuracy.  09/26/2022: 90% using the spoon, assist at time with the forearm motion, and grip on the utensils. 100% for finger foods.  08/15/2022:  90% using the spoon, assit at time with the forearm motion, and grip on the utensils. 100% for finger foods-independent with set-up- unsupervised.  07/20/22: self-feeds cereal using spoon 90% of task. 05/25/2022: Pt. Is able to use a spoon to scoop cereal when feeding himself cereal 85% of the time. Pt. Is able to feed himself snack/finger foods 100% of the time. Pt. Continues to work on consistency of  stabilizing a cup/mug when drinking. Pt. Is able to grasp a water bottle with assist initially, with assist tapering off as he drinks.03/23/2022: Pt. Is able to perform scooping cereal for 75% of the time. Pt. required assist, and support at the left elbow, and Pt. Presents with limited forearm supination when using the spoon, and bringing it towards his mouth. Pt. Is able to use a fork to spear items, and perform the hand to mouth pattern.  03/21/2022: Pt. Is able to perform scooping cereal for 75% of the time. Pt. required assist, and support at the left elbow, and Pt. presents with limited forearm supination when using the spoon, and bringing it towards his mouth. Pt. Is able to use a fork to spear items, and perform the hand to mouth pattern.  Eval: Pt. is able to hold standard standard utensils. Pt. Performs as much of the task as he, can and has assistance for the remainder. Goal status:  Ongoing  4.  Pt. Will improve grasp patterns and consistently grasp 1/4" objects for ADL, and IADL tasks.  Baseline: 03/08/23: 1/2" objects efficiently; 02/13/2023: 1/2" objects efficiently 01/09/2023: Continue 12/28/2022: Continues to grasp 1/4" pegs with min a + visual cues, consistently grasping 1/2" objects with visual cues 11/16/2022: Continues to grasp 1/4" pegs with min a + visual cues, consistently grasping 1/2" objects with visual cues 09/26/2022: Continues to grasp 1/4" pegs with min a + visual cues, consistently grasping 1/2" objects with visual cues. 08/15/2022: grasps 1/4" pegs with min a + visual cues, consistently grasping 1/2" objects with visual cues. 07/20/22: grasps 1/4" pegs with min a + visual cues, consistently grasping 1/2" objects with visual cues. 05/25/2022: Pt. Is working on improving consistency of grasping 1/2" objects with visual cues.  03/23/2022: Pt. Is able to grasp 1" objects consistently,and continues to work on the hand patterns needed to grasp 1/2" objects.03/21/2022: Pt. Is able to grasp 1" objects consistently,and continues to work on the hand patterns needed to grasp 1/2" objects. Eval: Pt. is able to grasp 1" objects intermittently using a lateral grasp pattern. Goal status: Ongoing  5.  Pt. will independently write his name legibly with letter sizes under 1". Baseline: 03/08/23: Not addressed this period; 02/13/2023: Continue 01/09/2023: Continue. 12/28/2022: Pt. is now using an IPad Pen. 11/16/2022: Pt. Continues to be able to write name with smaller letter size. Pt. Is signing name with a computer styus. Pt. Requires visual cues, and assist 09/26/2022: Pt. Continues to be able to write name with smaller letter size. Pt. Is signing name with a computer styus. Pt. Requires visual cues, and assist. 08/15/2022: Pt. Is able to write name with smaller letter size. Pt. Is signing name with a computer styus. Pt. Requires visual cues, and assist. 07/20/22: stabilizing assist to write  name with 75% legibility with 2" letters. 05/25/2022: Pt. to continue to work towards formulating grasp patterns in preparation for grasping a large width pen. Pt. Requires visual cues. 03/23/2022: Pt. Is able to write his name with modA, however has difficulty with formulating letter sizes less than 2" in size with 50% legibility for the 3 letters of his name.03/21/2022: Pt. Is able to write his name with modA, however has difficulty with formulating letter sizes less than 2" in size with 50% legibility for the 3 letters of his name. Eval: Pt is able to hold a thin marker with his left hand, and formulate a line, and initiate a circular pattern (Pt. without glasses today) Goal status: Ongoing  6. Pt. Will reach up to comb/brush his hair with minA.  Baseline: 03/08/23: Pt uses pick in L hand to reach 75% of his head; unable to reach R side of head; 02/13/2023:Pt. is able to use a hair pick with the left hand on the right side of his head, top, and back of the head. Pt. Continues to have difficulty reaching further to the right side of his head with the left. 01/09/2023: Continue 12/28/2022: Pt. Is progressing, and is now able to use a hair pick with the left hand on the right side of his head, top, and back of the head. Pt. Continues to have difficulty reaching further to the right side of his head with the left. 11/16/2022: Pt. Is able to use a hair pick for the left side of his head using his left hand. Pt. Presents with difficulty reaching to the right side of his head.09/26/2022: Pt. Is able to use a hair pick for  the left side of his head using his left hand. Pt. Presents with difficulty reaching to the right side of his head. 5/13/202: 75% using a hair pick, assist to the right side of the head. 07/20/22: reaches 75% of head, assist for far R side of head, fatigues quickly. 05/25/2022: Pt. Is able to reach up with the left hand to the left side, top, and back of his head. 03/23/2022: Pt. is now able to more  consistently initiate reaching up to his head with his left hand in preparation for haircare12/18/2023: Pt. is now able to more consistently initiate reaching up to his head with his left hand in preparation for haircare. Eval: Pt. is able to initiate reaching up for hair care with a long handled brush, however is unable to sustain UE's in elevation to perform the task.     Goal status: Ongoing  7. Pt. Will independently navigate the w/c through his environment with minA with visual scanning, and hand placement on the controls.  Baseline: 11/16/2022: Deferred 2/2 vision 09/26/2022: Pt. Is now navigating his w/c ouside the home, and around his block. 08/15/2022: Pt. Requires minA to setup hand on controls and MIN cues to navigate the w/c in wide spaces, requires MIN - MOD cues to navigate the w/c through more narrow doorways, and tighter turns - varies based on good vs bad vision days.07/20/22: Pt. Requires minA to setup hand on controls and MIN cues to navigate the w/c in wide spaces, requires MIN - MOD cues to navigate the w/c through more narrow doorways, and tighter turns - varies based on good vs bad vision days. 05/25/2022: Pt. Requires minA  and  cues to navigate the w/c in wide spaces, and requires Mod cues to navigate the w/c through more narrow doorways, and tighter turns. Pt. Requires max cues for scanning through the environment, and moderate cues for hand placement on the controls.     Goal status: Deferred 2/2 vision    8. Pt. Will improve bilateral grip strength to be able to independently grasp, and pull up blankets, and linens.   Baseline: 03/08/23: R grip 0-5 lbs, L grip 8 lbs; pt reports he can consistently pull up blankets and linens independently.  02/13/2023: Continue. Pt. presents with digit PIP, and DIP flexor tightness. 01/09/2023: Continue 12/28/2023: R: 0#, L: 5# Pt. Is now able to more consistently grasp and pull blankets up over him. 11/16/2022: R: 0 L: 8# Pt. Is more consistently  grasping his blankets and attempting to pull them up. 09/26/2022: R: 0  L: 8# Pt. is attempting to grasp, and pull blankets up more, using mostly the left hand. 08/15/2022: Pt. has difficulty securely holding, and pulling up blankets, and linens.    Goal status: Ongoing  9. Pt. will consistently actively control the releasing of blankets, covers, and linens from his hands once they are in the desired position over him. Baseline:12/28/2022: Pt. Is consistently actively able to release linens once they are in the desired position over him. 11/16/2022: Pt. continues to improve with actively releasing blankets form his hands. 09/26/2022: Pt. is improving with actively releasing blankets form his hands. 08/15/2022: Pt. has difficulty with controlled releasing of blankets/linens from his grasp.     Goal status: Achieved    10. Pt. Will improve bilateral UE strength by 2 MM grades to assist with ADLs, and IADLs  Baseline: 03/13/2023: Right: shoulder flexion: 3/5, abduction: 4/5, elbow flexion: 4+/5 , extension: 4-/5 wrist extension: 2/5; Left: shoulder flexion: 4+/5,  abduction: 4+/5, elbow flexion: 5/5 , extension: 4/5  wrist extension: 3/5 03/08/23: NT this date d/t as pt arrived late; 02/13/2023: Continue 01/09/2023: Continue 12/28/2022: Right: shoulder flexion: 3/5, abduction: 4/5, elbow flexion: 4+/5 , extension: 4-/5 wrist extension: 2/5; Left: shoulder flexion: 4+/5, abduction: 4+/5, elbow flexion: 5/5 , extension: 4/5  wrist extension: 3/5 Right: shoulder flexion: 3+/5, abduction: 4/5, elbow flexion: 4+/5 , extension: 4+/5 wrist extension: 2/5; Left: shoulder flexion: 4+/5, abduction: 4+/5, elbow flexion: 5/5 , extension: 4+/5 wrist extension: 3-/5 11/16/2022: Right: shoulder flexion: 3/5, abduction: 4/5, elbow flexion: 4+/5 , extension: 4+/5 wrist extension: 2/5; Left: shoulder flexion: 4+/5, abduction: 4+/5, elbow flexion: 5/5 , extension:  wrist extension: 3-/5 Right: shoulder flexion: 3+/5, abduction: 4/5,  elbow flexion: 4+/5 , extension: 4+/5 wrist extension: 2/5; Left: shoulder flexion: 4+/5, abduction: 4+/5, elbow flexion: 5/5 , extension: 4+/5 wrist extension: 3-/5 Goal status: Ongoing  11. Pt. Will improve right wrist extension by 10 degrees to initiate reaching for items in preparation for ADLs. Baseline: 03/13/23: -22(35) 03/08/23: NT this date as pt arrived late; 02/13/2023: Continue 01/09/2023: Continue 12/28/2022: -22(34) 11/16/2022: -30(10)    Goal status: Progressing   ASSESSMENT:  CLINICAL IMPRESSION:  Pt. tolerated the exercises well today. Pt. reports that his right LE was feeling "off" when standing with PT this afternoon, and that his Right side felt heavier.  Pt. Reports that it went away. Pt. was able to complete more pegs on the Box, and Blocks Test today. Pt. presented with improved grasping with the right hand, as well as improved elbow extension. Pt. continues to be highly motivated to continue working towards improving BUE functioning, ROM, strength, motor control, [facility] skills, as well as visual compensatory strategies in order to maximize independence with daily ADLs, and IADL functioning.      PERFORMANCE DEFICITS in functional skills including ADLs, IADLs, coordination, dexterity, proprioception, ROM, strength, pain, FMC, GMC, decreased knowledge of use of DME, and UE functional use, cognitive skills including safety awareness, and psychosocial skills including coping strategies, environmental adaptation, habits, and routines and behaviors.   IMPAIRMENTS are limiting patient from ADLs, IADLs, leisure, and social participation.   COMORBIDITIES may have co-morbidities  that affects occupational performance. Patient will benefit from skilled OT to address above impairments and improve overall function.  MODIFICATION OR ASSISTANCE TO COMPLETE EVALUATION: Maximum or significant modification of tasks or assist is necessary to complete an evaluation.  OT OCCUPATIONAL PROFILE AND  HISTORY: Comprehensive assessment: Review of records and extensive additional review of physical, cognitive, psychosocial history related to current functional performance.  CLINICAL DECISION MAKING: High - multiple treatment options, significant modification of task necessary  REHAB POTENTIAL: Fair    EVALUATION COMPLEXITY: High    PLAN: OT FREQUENCY: 2 x's a week  OT DURATION:12 weeks  PLANNED INTERVENTIONS: self care/ADL training, therapeutic exercise, therapeutic activity, neuromuscular re-education, manual therapy, passive range of motion, patient/family education, and cognitive remediation/compensation RECOMMENDED OTHER SERVICES: PT  CONSULTED AND AGREED WITH PLAN OF CARE: Patient and family member/caregiver  PLAN FOR NEXT SESSION: Review HEP  Olegario Messier, MS, OTR/L  03/13/2023

## 2023-03-15 NOTE — Therapy (Unsigned)
OUTPATIENT PHYSICAL THERAPY TREATMENT/Physical Therapy Progress Note   Dates of reporting period  01/30/2023   to   03/15/2023      Patient Name: Paul Hall MRN: [id number] DOB:12-11-1995, 27 y.o., male Today's Date: 03/16/2023  PCP:  Cindi Carbon PROVIDER:  Lenise Herald, PA-C   PT End of Session - 03/15/23 1651     Visit Number 140    Number of Visits 146    Date for PT Re-Evaluation 02/22/23    Authorization Type [employer]BS COMM Pro/ [place] Medicaid    Authorization Time Period 01/04/21-03/29/21; Recert 03/24/2021-06/16/2021; Recert 09/15/2021- 12/08/2021; Recert 12/13/2021-03/07/2022    Progress Note Due on Visit 150    PT Start Time 1530    PT Stop Time 1614    PT Time Calculation (min) 44 min    Equipment Utilized During Treatment Gait belt    Activity Tolerance Patient tolerated treatment well    Behavior During Therapy WFL for tasks assessed/performed                     Past Medical History:  Diagnosis Date   Diabetes mellitus (HCC)    Hypertension    Stroke (HCC)    Past Surgical History:  Procedure Laterality Date   IVC FILTER PLACEMENT ([facility] HX)     LEG SURGERY     PEG TUBE PLACEMENT     TRACHEOSTOMY     Patient Active Problem List   Diagnosis Date Noted   Sepsis due to vancomycin resistant Enterococcus species (HCC) 06/06/2019   SIRS (systemic inflammatory response syndrome) (HCC) 06/05/2019   Acute lower UTI 06/05/2019   VRE (vancomycin-resistant Enterococci) infection 06/05/2019   Anemia 06/05/2019   Skin ulcer of sacrum with necrosis of muscle (HCC)    Urinary retention    Type 2 diabetes mellitus without complication, with long-term current use of insulin (HCC)    Tachycardia    Lower extremity edema    Acute metabolic encephalopathy    Obstructive sleep apnea    Morbid obesity with BMI of 60.0-69.9, adult (HCC)    Goals of care, counseling/discussion    Palliative care encounter    Sepsis (HCC) 04/27/2019   H/O insulin dependent  diabetes mellitus 04/27/2019   History of CVA with residual deficit 04/27/2019   Seizure disorder (HCC) 04/27/2019   Decubitus ulcer of sacral region, stage 4 (HCC) 04/27/2019    REFERRING DIAG: Cerebral infarction, unspecified   THERAPY DIAG:  Muscle weakness (generalized)  Other lack of coordination  Difficulty in walking, not elsewhere classified  Unsteadiness on feet  Abnormality of gait and mobility  Other abnormalities of gait and mobility  Rationale for Evaluation and Treatment Rehabilitation  PERTINENT HISTORY: Aashay Savignano is a 26yoM who presents with severe weakness, quadriparesis, altered sensorium, and visual impairment s/p critical illness and prolonged hospitalization. Pt hospitalized in October 2020 with ARDS 2/2 COVID19 infection. Pt sustained a complex and lengthy hospitalization which included tracheostomy, prolonged sedation, ECMO. In this period pt sustained CVA and SDH. Pt has now been liberated from tracheostomy and G-tube. Pt has since been hospitalized for wound infection and UTI. Pt lives with parents at home, has hospital bed and left chair, hoyer lift transfers, and power WC for mobility needs. Pt needs heavy physical assistance with ADL 2/2 BUE contractures and motor dysfunction   PRECAUTIONS: Fall  SUBJECTIVE:  Pt reported feeling well overall without any issues today.     PAIN:  Are you having pain? No   TODAY'S TREATMENT:  -  03/16/23    Physical therapy treatment session today consisted of completing assessment of goals and administration of testing as demonstrated and documented in flow sheet, treatment, and goals section of this note. Addition treatments may be found below:     *Dependent transfer from w/c to edge of mat using hoyer lift (+2 person assist)  Therapeutic Exercises: (at edge of mat)  Sitting Marches 2x10 each LE Lateral weights shifts x 10 each direction Forward/Backwards weight shifts x 10 each direction  Therapeutic  activities: (at edge of mat)  Static standing trials x 2 - All less than 1 min each (max effort from patient and max assist +2 to remain in standing). Pt demonstrated great effort on first and second trials of standing. He required increased rest break as he was straining hard with standing and can become dizzy at times- None today however.   PATIENT EDUCATION: Education details: Exercise technique with VC   HOME EXERCISE PROGRAM:  Access Code: [email or web address] URL: [email or web address] Date: 03/23/2022 Prepared by: Maureen Ralphs  Exercises - Supine Bridge  - 3 x weekly - 3 sets - 10 reps - 2 hold - Supine Gluteal Sets  - 3 x weekly - 3 sets - 10 reps - 5 sec hold - Supine Quad Set  - 1 x daily - 3 x weekly - 3 sets - 10 reps - 5 hold - Seated Long Arc Quad  - 1 x daily - 7 x weekly - 3 sets - 10 reps - Seated Hip Adduction Squeeze with Ball  - 1 x daily - 3 x weekly - 3 sets - 10 reps - 5 hold - Seated Hip Abduction  - 1 x daily - 3 x weekly - 3 sets - 10 reps - 2 hold    PT Short Term Goals -       PT SHORT TERM GOAL #1   Title Pt will be independent with HEP in order to improve strength and balance in order to decrease fall risk and improve function at home and work.    Baseline 01/04/2021= No formal HEP in place; 12/12 no HEP in place; 05/10/2021-Patient and his father were able to report compliance with curent HEP consisting of mostly seated/reclined LE strengthening. Both verbalize no questions at this time.    Time 6    Period Weeks    Status Achieved    Target Date 02/15/21            PT LONG TERM GOAL #1     Title Patient will increase BLE gross strength by 1/2 muscle grade to improve functional strength for improved independence with potential gait, increased standing tolerance and increased ADL ability.     Baseline 01/04/2021- Patient presents with 1/5 to 3-/5 B LE strength with MMT; 12/12: goal partially met for Left knee/hip; 05/10/2021= 2-/5 bilateal Hip  flex; 3+/5 bilateral Knee ext; 06/21/2021= Patient presents with 2-/5 bilateral Hip flex; 3+/5 bilateral knee ext/flex; 2-/5 left ankle DF; 0/5 right ankle- and able to increase reps and resistance with LE's. 09/15/2021- Patient technically presents with 2-/5 B hip flex/abd/add - but he is able to raise his hip up to approx 100 deg which has improved. 3+/5 Bilateral knee ext, 2-/5 left ankle and 0/5 right ankle.  12/08/2021= Patient able to lift left knee at 110 deg of hip flex; presents with 3+/5 knee ext, 2-/5 left ankle DF and 0/5 right ankle DF, 2-/5 bilateral Hip abd in seated position.    12/6: R: knee  3+/5 ext, 2/5 flexion, left knee 3+/5 extension, 3+/5 flexion, R hip: 2+/5 hip add, 2+/5 hip ABD L hip: 4-/5 hip ABD, 3+/5 hip ADD, 3+/5 hip flexion; 06/06/2022= Patient now presents with 2-/5 right ankle DF/PF;     Time 12     Period Weeks     Status MET    Target Date 03/07/2022         PT LONG TERM GOAL #2    Title Patient will tolerate sitting unsupported demonstrating erect sitting posture for 15 minutes with CGA to demonstrate improved back extensor strength and improved sitting tolerance.     Baseline 01/04/2021- Patient confied to sitting in lift chair or electric power chair with back support and unable to sit upright without physical assistance; 12/12: tolerates <1 minutes upright unsupported sitting. 05/10/2021=static sit with forward trunk lean  in his power wheelchair without back support x approx 3 min. 06/21/2021=Unable to assess today due to patient with acute back pain but on previous visit able to sit x 8 min without back support. 09/15/2021- on last visit- 09/13/2021- patient was able to sit unsupported x 8 min at edge of mat. 10/13/2023 - Patient was able to sit at edge of mat with varying level of assist today from SBA to min A for a total of 20 min. 12/13/2021= Patient demonstrated unsupported sitting at edge of mat for approx 20 min    Time 12     Period Weeks     Status GOAL MET    Target  Date 12/08/21          PT LONG TERM GOAL #3    Title Patient will demonstrate ability to perform static standing in // bars > 2 min with Max Assist  without loss of balance and fair posture for improved overall strength for pre-gait and transfer activities.     Baseline 01/04/2021= Patient current uanble to stand- Dependent on hoyer or sit to stand lift for transfers. 05/10/2021=Not appropriate yet- Currently still dependent with all transfers using hoyer. 06/21/2021= Patient continuing now to focus on LE strengthening to prepare for standing-unable to try today due to acute low back pain-  planning on attempting in new cert period. 09/15/2021- Patient has attempted standing 2x in past two week- max Assist of 2 people - only once was he successful to clearing his bottom from chair - Will continue to be a focus during the new certification. 12/13/2021= Patient has been limited secondary to increased overall low back pain during this certification and will require more time to focus on this goal.  12/6: not assessed this date, will assess at date when 2-3 PTs are present for assistance     Time 12     Period Weeks     Status GOAL not appropriate at this time - may attempt in future once Patient presents with improved overall LE strength.                 PT LONG TERM GOAL #4    Title Pt will improve FOTO score by 10 points or more demonstrating improved perceived functional ability     Baseline FOTO 7 on 10/17; 03/15/21: FOTO 12; 05/10/2021 06/21/2021= 1; 09/15/2021= 9; 12/13/2021= Will issue next visit 12/6: 4; 06/06/2022= Will assess next visit; Goal not appropriate as patient is not ambulatory.     Time 12     Period Weeks     Status WITHDRAWN    Target date 11/30/2022  PT LONG TERM GOAL #5    Title Patient will perform sit to stand transfer with appropriate AD and max assist of 2 people with 75% consistency to prepare for pregait activities.     Baseline 09/15/2021= Patient unable to stand well- unable  to clear his bottom off chair with Max assist of 2 persons. 12/13/2021- Goal not appropriate to try yet but will keep and roll over to next cert as shift continues to focus on transfers/standing; 4/24= Patient able to perform active ankle DF/PF with right LE and able to raise his knee into seated march and clear floor without physical assist today - as previously unable as well as lift right knee ext to near full ROM to improve strength for eventual standing. 09/07/2022- Goal continues to not be appropriate but patient is now standing some and will keep goal active; 11/23/2022= patient is now performing sit to stand with max A +2 and now able to clear his bottom off mat. 11/30/2022- Patient continues to perform sit to stand transfers with Max A+2 and able to clear bottom well off mat but not erect yet-     Time 12     Period Weeks     Status PROGRESSING    Target date  02/22/2023       PT LONG TERM GOAL #6  Title Patient will tolerate sitting unsupported demonstrating erect sitting posture for 30 minutes with CGA to demonstrate improved back extensor strength and improved sitting tolerance.   Baseline 12/13/2021= Patient demonstrated unsupported sitting at edge of mat for approx 20 min;  12/29/2021- Patient performed approx 30 min of dynamic sitting activities today. 06/06/2022= Patient demonstrated ability to sit and perform static and dynamic UE/LE movement with only Supervision.   Time 12   Period Weeks   Status GOAL MET           7.  Patient will tolerate 2 minutes or more of standing in sit to stand lift or with max assist +2 in order to indicate improved lower extremity weightbearing tolerance for progression to standing in parallel bars. Baseline: 1 minute on most recent stand 02/21/22; 06/06/2022- Patient did attempt today after complaining of right LE pain last week. He attempted 3 stands using sit to stand lift. - all over 1 min- last one approx 48 sec- stopped due to fatigue. More erect  standing in lift today - still poor gluteal strength but able to activate glutes and extend hips upon command but unable to hold > 5 sec. 07/28/2022- Will attempt standing next visit but patient has been able to stand since last progress note for up to 3 min at a time at his best. 09/07/2022-  attempted standing with max +2 and patient able to stand 15-20 sec so will now  revise goal; 11/23/2022- Patient at his best between last 2 visits was able to stand approx 50 sec with max A+2.  11/30/2022- Patient performed 4 trials of static standing - max A +2- clearing bottom and using forearms to bear weight  03/15/2023: Patient performed 2 trials of static standing - max A +2- clearing edge of mat and using forearms to bear weight for approximately 45 seconds, is able to come into approximately 45 degrees of knee flexion from sit to stand Goal status: PROGRESSING Target date: 02/22/2023   8. Patient will tolerate sitting unsupported demonstrating ability to perform dynamic UE/LE activities for 30 minutes  independently to demonstrate ability to sit at edge of bed to eat or  perform some ADL's/exercise for optimal quality of life.             Baseline: 06/06/2022- Patient able to sit and perform some UE/LE exercises but requires CGA at times for safety- he is able to static sit for 30 min with supervision. 07/27/2022= Patient able to now sit > 30 min with Supervision only - performing static and dynamic activities. Will keep goal active to ensure patient is consistent. 09/07/2022=  Patient able to demonstrate consistent ability to sit at edge of mat during visits             Goal Status: MET             Target date: 08/29/2022        Plan     Clinical Impression Statement Today's progress note focused on assessing the goal of standing for 2 minutes with a 2 person max assist. Pt was able to stand for approximately 45 seconds for 2 trials, but was able to get to 45 degrees of knee flexion for both trials, which is the  closest to terminal knee extension that the pt has came to in standing. This is a positive progression for being able to achieve the goal of standing for 2 minutes with a 2 person max assist. Patient's condition has the potential to improve in response to therapy. Maximum improvement is yet to be obtained. The anticipated improvement is attainable and reasonable in a generally predictable time.    Pt will continue to benefit from skilled physical therapy intervention to address impairments, improve QOL, and attain therapy goals.    Personal Factors and Comorbidities Comorbidity 3+;Time since onset of injury/illness/exacerbation    Comorbidities CVA, diabetes, Seizures    Examination-Activity Limitations Bathing;Bed Mobility;Bend;Caring for Others;Carry;Dressing;Hygiene/Grooming;Lift;Locomotion Level;Reach Overhead;Self Feeding;Sit;Squat;Stairs;Stand;Transfers;Toileting    Examination-Participation Restrictions Cleaning;Community Activity;Driving;Laundry;Medication Management;Meal Prep;Occupation;Personal Finances;Shop;Yard Work;Volunteer    Stability/Clinical Decision Making Evolving/Moderate complexity    Rehab Potential Fair    PT Frequency 2x / week    PT Duration 12 weeks    PT Treatment/Interventions ADLs/Self Care Home Management;Cryotherapy;Electrical Stimulation;Moist Heat;Ultrasound;DME Instruction;Gait training;Stair training;Functional mobility training;Therapeutic exercise;Balance training;Patient/family education;Orthotic Fit/Training;Neuromuscular re-education;Wheelchair mobility training;Manual techniques;Passive range of motion;Dry needling;Energy conservation;Taping;Visual/perceptual remediation/compensation;Joint Manipulations    PT Next Visit Plan core strength/motor control in short-sitting, sit to stand if able; Continue with progressive LE Strengthening. Stand activities as appropriate.     PT Home Exercise Plan No changes to HEP today    Consulted and Agree with Plan of Care  Patient;Family member/caregiver    Family Member Consulted           [place], SPT  This entire session was performed under direct supervision and direction of a licensed Estate agent . I have personally read, edited and approve of the note as written.    03/09/2023, 8:04 AM      10:48 AM, 03/16/23 Louis Meckel, PT Physical Therapist - [facility]  Outpatient Physical Therapy- Main Campus [phone]

## 2023-03-20 ENCOUNTER — Ambulatory Visit: Payer: BC Managed Care – PPO | Admitting: Occupational Therapy

## 2023-03-20 ENCOUNTER — Ambulatory Visit: Payer: BC Managed Care – PPO

## 2023-03-20 DIAGNOSIS — R278 Other lack of coordination: Secondary | ICD-10-CM

## 2023-03-20 DIAGNOSIS — R2689 Other abnormalities of gait and mobility: Secondary | ICD-10-CM

## 2023-03-20 DIAGNOSIS — M6281 Muscle weakness (generalized): Secondary | ICD-10-CM

## 2023-03-20 DIAGNOSIS — R269 Unspecified abnormalities of gait and mobility: Secondary | ICD-10-CM | POA: Diagnosis not present

## 2023-03-20 DIAGNOSIS — R262 Difficulty in walking, not elsewhere classified: Secondary | ICD-10-CM | POA: Diagnosis not present

## 2023-03-20 DIAGNOSIS — I6930 Unspecified sequelae of cerebral infarction: Secondary | ICD-10-CM

## 2023-03-20 DIAGNOSIS — R2681 Unsteadiness on feet: Secondary | ICD-10-CM | POA: Diagnosis not present

## 2023-03-20 NOTE — Therapy (Signed)
Occupational Therapy Neuro Treatment Note   Patient Name: Paul Hall MRN: [id number] DOB:[birth year], 27 y.o., male Today's Date: 03/20/2023  PCP: Dr. Sherwood Gambler REFERRING PROVIDER: Dr. Sherwood Gambler   OT End of Session - 03/20/23 1725     Visit Number [id number]    Number of Visits 84    Date for OT Re-Evaluation 05/08/23    Authorization Type 09/26/2022    OT Start Time 1615    OT Stop Time 1700    OT Time Calculation (min) 45 min    Activity Tolerance Patient tolerated treatment well    Behavior During Therapy WFL for tasks assessed/performed                Past Medical History:  Diagnosis Date   Diabetes mellitus (HCC)    Hypertension    Stroke [facility])    Past Surgical History:  Procedure Laterality Date   IVC FILTER PLACEMENT ([facility] HX)     LEG SURGERY     PEG TUBE PLACEMENT     TRACHEOSTOMY     Patient Active Problem List   Diagnosis Date Noted   Sepsis due to vancomycin resistant Enterococcus species (HCC) 06/06/2019   SIRS (systemic inflammatory response syndrome) (HCC) 06/05/2019   Acute lower UTI 06/05/2019   VRE (vancomycin-resistant Enterococci) infection 06/05/2019   Anemia 06/05/2019   Skin ulcer of sacrum with necrosis of muscle (HCC)    Urinary retention    Type 2 diabetes mellitus without complication, with long-term current use of insulin (HCC)    Tachycardia    Lower extremity edema    Acute metabolic encephalopathy    Obstructive sleep apnea    Morbid obesity with BMI of 60.0-69.9, adult (HCC)    Goals of care, counseling/discussion    Palliative care encounter    Sepsis (HCC) 04/27/2019   H/O insulin dependent diabetes mellitus 04/27/2019   History of CVA with residual deficit 04/27/2019   Seizure disorder (HCC) 04/27/2019   Decubitus ulcer of sacral region, stage 4 (HCC) 04/27/2019   ONSET DATE: 01/2019  REFERRING DIAG: CVA/COVID-19  THERAPY DIAG:  Muscle weakness (generalized)  Rationale for Evaluation and Treatment  Rehabilitation  SUBJECTIVE:   SUBJECTIVE STATEMENT:  Pt  reports doing well today.   Pt accompanied by: self and family member  PERTINENT HISTORY:  Pt. is a 27 y.o. male who was diagnosed with COVID-19, and CVA with resultant quadriplegia. Pt. Was then hospitalized with VRE UTI. PMHx includes: urinary retention, seizure disorder, obstructive sleep disorder, DM Type II, Morbid obesity.   PRECAUTIONS: None  WEIGHT BEARING RESTRICTIONS No  PAIN:  03/08/23: 0 pain at rest, 5/10 when stretching in BUEs Are you having pain? Reports right  wrist tightness.  FALLS: Has patient fallen in last 6 months? No  LIVING ENVIRONMENT: Lives with: lives with their family Lives in: House/apartment Stairs: No Level Entry Has following equipment at home: Wheelchair (power) and hoyer lift, sit to stand lift  PLOF: Independent  PATIENT GOALS: To be able to engage in more daily care tasks.  OBJECTIVE:   HAND DOMINANCE: Right  ADLs:  Transfers/ambulation related to ADLs: Eating: Pt. reports being able to hold standard utensils, and is starting to engage more in self-feeding tasks, hand to mouth patterns. Pt. Reports that he does as much as he can with the task, and family assists  with the remainder of the task. Grooming: Pt. Is able to initiate holding an electric toothbrush, and brush his teeth. Family assists LB Dressing: Total Assist UE  dressing: Pt. is now able to reach up to actively assist with grasping , and pulling his gown down. Toileting:  Total Assist Bathing: MaxA UB, Total assist LB Tub Shower transfers: N/A Equipment: See above    IADLs: Shopping: Relies on family to assist Light housekeeping: Total Assist Meal Prep: Total Assist Community mobility:   Medication management:  Total Assist  Financial management: N/A Handwriting: Not legible: Pt. Is able to hold a pen with the left hand, and initiate marking the page. Pt.'s eye glasses were not available  MOBILITY STATUS:   Power w/c  POSTURE COMMENTS:  Pt. Requires position changes in his power w/c  ACTIVITY TOLERANCE: Activity tolerance:  Fair  FUNCTIONAL OUTCOME MEASURES: FOTO: 40  TR score: 45  05/25/2022:   FOTO: 50 TR score: 45  07/20/22: FOTO 55  03/08/23: 51  UPPER EXTREMITY ROM     Active ROM Left eval Left  03/21/2022 Left 05/25/2022 L  07/20/22 Left 07/25/2022 Left  08/15/2022 Left  09/26/2022 Left  11/16/2022 Left 12/28/2022 Left 03/13/23  Shoulder flexion 119 123 125 130  136 138 130 142 144  Shoulder abduction 110 115 115 115  118 121 125 142 140  Shoulder adduction            Shoulder extension            Shoulder internal rotation            Shoulder external rotation            Elbow flexion 135(135) 145 145 145  145    145  Elbow extension -27(-18) -21(-20) -20(-16)  -25(-10) -23(-10) -21(-10) -20(-18) -20(-18) -20(-17)  Wrist flexion            Wrist extension 10(50) 19(50) 28(50)  30(50) 35(55) 36(55) 36(60) 40(60) 45(60)  Wrist ulnar deviation            Wrist radial deviation            Wrist pronation            Wrist supination   Limited by flexor tone       10(15)  (Blank rows = not tested)    Active ROM Right eval Right 03/21/2022 Right 05/25/2022 R  07/20/22 Right  07/25/22 Right 08/15/2022 Right 09/26/2022 Right 11/16/2022 Right 12/28/2022 Right 03/13/23  Shoulder flexion 106 scaption 105  Scaption 62 flexion 110 scaption 100  105 105 105 112 Scaption 123 Scaption  Shoulder abduction 114 90 97 100  105 117 120 124 124  Shoulder adduction            Shoulder extension            Shoulder internal rotation            Shoulder external rotation            Elbow flexion 120(130) 145 145 140  144 140 142 148 148  Elbow extension -45(-35) -22(-35) -28(-22)  -32(-21) -32(-28) -28(-26) -28(-28) -27(-26) -27(-40)  Wrist flexion            Wrist extension -30(10) -25(10) -10(30)  -10(20) -10(25) -20(40) -30(10) -22(34) -22(35)  Wrist ulnar deviation             Wrist radial deviation            Wrist pronation            Wrist supination   Limited by flexor tone  12/28/2022: Thumb radial abduction: Right: 12(46), Left: 40(54)  Digits: Bilateral 2nd through 5th digit: MP Hyperextension with PIP, an DIP flexion     UPPER EXTREMITY MMT:     Left eval Left 03/21/2022 Left  05/25/2022 L 07/20/22 Left  08/15/2022 Left 09/26/2022 Left 11/16/2022 Left 12/28/2022 Left  03/13/2023  Shoulder flexion 3-/5 3/5 3+/5 4+/5 4+/5 4+/5 4+/5 4+/5 4+/5  Shoulder abduction 3-/5 3-/5 4-/5 4+/5 4+/5 4+5 4+/5 4+/5 4+/5  Shoulder adduction           Shoulder extension           Shoulder internal rotation           Shoulder external rotation           Elbow flexion 3/5 3+/5 4-/5 4+/5 5/5 5/5 5/5 5/5 5/5  Elbow extension 2-/5 2/5  4+/5 4+/5 4+/5  4/5 4/5  Wrist flexion           Wrist extension 2/5 2+/5 3-/5  3-/5 3-/5 3-/5 3/5 3/5  Wrist ulnar deviation           Wrist radial deviation           Wrist pronation           Wrist supination           (Blank rows = not tested)  Right eval Right 03/21/2022 Right  05/25/2022 R 07/20/22 Right 08/15/2022 Right 09/26/2022 Right 11/16/2022 Right 12/28/2022 Right 03/13/2023  Shoulder flexion 3-/5 scaption 3-/5 3-/5 3+/5 3+/5 3+/5 3/5 3/5 3/5  Shoulder abduction 3-/5 3-/5 3-/5 4/5 4/5 4/5 4/5 4/5 4/5  Shoulder adduction           Shoulder extension           Shoulder internal rotation           Shoulder external rotation           Elbow flexion 3/5 3/5 3+/5 4/5 4+/5 4+/5 4+/5 4+/5 4+/5  Elbow extension 2-/5 2/5 3-/5 4+/5 4+/5 4+/5 4+/5 4-/5 4-/5  Wrist flexion           Wrist extension 2-/5 2-/5 2/5 2/5 2/5 2/5 2/5 2/5 2/5  Wrist ulnar deviation           Wrist radial deviation           Wrist pronation           Wrist supination             HAND FUNCTION:  Grip strength: Right: 0 lbs; Left: 0 lbs and Lateral pinch: Right: 5 lbs, Left: 2 lbs  03/21/2022: Lateral pinch: Right: 3.5 lbs, Left: 2  lbs  07/20/22: Grip strength: Right: 3 lbs; Left: 4 lbs and Lateral pinch: Right: 5 lbs, Left: 3 lbs  08/15/2022:  Grip strength: Right: 0 lbs; Left: 5 lbs and Lateral pinch: Right: 2 lbs, Left: 4 lbs  09/26/2022  Grip strength: Right: 0 lbs; Left: 8 lbs and Lateral pinch: Right: 2 lbs, Left: 5 lbs  11/16/2022  Grip strength: Right: 0 lbs; Left: 8 lbs  9/25/20204  Grip strength: Right: 0 lbs; Left: 8 lbs  Grip strength: Right: 0 lbs; Left: 8 lbs   03/08/23:  Grip strength: Right: 0 lbs without PROM, 5 lbs after PROM, Left 8 lbs  Bilateral digit PIP/DIP flexion contractures with MP hyperextension with attempts for AROM. Pt. is able to tolerate AROM to the bilateral digits at the initial evaluation however, has a history of pain  in the digits.  COORDINATION: Eval: Pt. is unable to grasp 9-hole test pegs. Pt. is able to initiate grasping larger pegs, and is able to hold a pen in the left hand.  07/20/22: 2 min 36 seconds to remove 9 pegs from 9 hole peg test - cues to locate pegs 2/2 low vision. Pt. is able to initiate grasping larger pegs on R hand and is able to hold a pen in the left hand.  08/15/2022:    Vision, and sensation limiting accuracy of 9 hole peg test results. Pt. Was able to grasp and remove vertical pegs intermittently with cues.    SENSATION: Light touch: Impaired   EDEMA:  N/A  MUSCLE TONE: BUE flexor Spasticity  COGNITION Overall cognitive status: Continue to assess in functional context  VISION:   Subjective report: Pt. was not wearing glasses at the time of the initial eval.  Baseline vision: Vision is very limited. Wears glasses all the time Visual history: History of impaired vision following CVA. Pt. Has received treatment through the [facility] low vision rehabilitation program.   VISION ASSESSMENT: Impaired To be further assessed in functional context  PERCEPTION: Impaired   PRAXIS: Impaired: motor planning  OBSERVATIONS:  Pt reports being on  Tramadol   TODAY'S TREATMENT   Therapeutic Ex.:   Pt. performed AROM for right shoulder flexion, abduction. Emphasis was placed on slow prolonged gentle passive stretching for RUE, forearm supination, bilateral wrist extension, wrist extension, digit MP, PIP, and DIP extension following moist heat. ROM was performed to decrease stiffness, and prepare the RUE for functional use. ROM was performed following moist heat to the shoulder right elbow, forearm, wrist, and digits. PROM for bilateral digit MP, PIP, and DIP extension in preparation for placing them onto a flat surface.  Neuromuscular re-education:  Pt. worked on grasping 1 " cubes with the right hand from a stable surface, as well as grasping them from an unstable surface. Pt. worked on moving them while extending the right elbow, and place them over the wall in front of him.      PATIENT EDUCATION: Education details: progress towards goals Person educated: Patient and Parent Education method: Explanation Education comprehension: verbalized understanding  HOME EXERCISE PROGRAM:  Continue ongoing assessment, and continue to provide as needed.   GOALS: Goals reviewed with patient? Yes  SHORT TERM GOALS: Target date: 11/07/2022   To assess splint fit, and make appropriate adjustments to promote good skin integrity through the palmar surface of the bilateral hands.  Baseline: 05/25/22: Goal currently met, however ongoing as needs to assess splint fit arise. 03/23/2022: Pt. is wearing splints a couple of hours at night bilateral resting hand splints. 03/21/2022: Pt. is wearing splints a couple of hours at night bilateral resting hand splints. Goal status: Deferred   LONG TERM GOALS: Target date: 05/08/2023    FOTO score will Improve by 2 points for Pt. perceived improvement with the assessment specific ADL/IADL tasks.  Baseline:  03/08/23: 51; 12/28/2022: FOTO score: 56; 11/16/2022: 52 09/27/2022: 51 07/20/2022: FOTO 55 05/25/2022: FOTO  score: 50,  TR score: 45 Eval: FOTO score: 40,  TR score: 45 Goal status: Achieved   2.   Pt. will independently perform oral care for 100% of the task after complete set-up. Baseline: 03/08/23: not as much assist required proximally, but to adjust positioning of toothbrush; 02/13/23: Pt. Continues to require assist proximally 01/09/2023: Continue 12/28/2022:  Pt. Continues to complete 90% of the task with complete set-up, with visual cues, and  occassional assist of the right elbow. 11/16/2022: Pt. Completes 90% of the task with complete set-up. 09/26/2022: Completes 90% of the task, proximal assist at times required at the elbow.  08/15/2022: Completes 90% of the task, proximal assist at times required at the elbow. 07/20/22: completes 90% of task, limited by shoulder flexion. 05/25/2022:  Pt. Is able to initiate and perform oral care for approximately 90% of the task. Complete set-up required. Assi needed only for the very back teeth. 03/23/2022: Pt. Is able to initiate and perform oral care for approximately 75% of the task. Pt. Requires assist at proximally at the elbow for through oral care. 03/21/2022: Pt. Is able to initiate and perform oral care for approximately 75% of the task. Pt. Requires assist at proximally at the elbow for through oral care. Eval: Pt. is able to initiate using an electric toothbrush. Pt. requires assist for set-up, and assist for thoroughness, and as he Pt. fatigues. Goal status: Ongoing  3.  Pt. Will be modified independence with self-feeding for 100% using a swivel spoon, and standard fork after complete set-up Baseline: 03/08/23: indep with cup with a handle and lid, assist to reposition eating utensils in hand;  02/13/23:  Pt. Is now able to consistently, and efficiently use a swivel spoon for eating cereal with the left hand.01/09/2023: Continue 12/28/2022: Pt. Is now feeding himself 90% of the time using a swivel spoon with the left hand, and 95% of the time with the fork. Pt.  Continues to have difficulty with cutting food. 11/16/2022: 100% with finger foods, Pt. Is initiating with a swivel spoon, and universal cuff for fork use, however requires assist for consistency, and accuracy.  09/26/2022: 90% using the spoon, assist at time with the forearm motion, and grip on the utensils. 100% for finger foods.  08/15/2022:  90% using the spoon, assit at time with the forearm motion, and grip on the utensils. 100% for finger foods-independent with set-up- unsupervised.  07/20/22: self-feeds cereal using spoon 90% of task. 05/25/2022: Pt. Is able to use a spoon to scoop cereal when feeding himself cereal 85% of the time. Pt. Is able to feed himself snack/finger foods 100% of the time. Pt. Continues to work on consistency of  stabilizing a cup/mug when drinking. Pt. Is able to grasp a water bottle with assist initially, with assist tapering off as he drinks.03/23/2022: Pt. Is able to perform scooping cereal for 75% of the time. Pt. required assist, and support at the left elbow, and Pt. Presents with limited forearm supination when using the spoon, and bringing it towards his mouth. Pt. Is able to use a fork to spear items, and perform the hand to mouth pattern.  03/21/2022: Pt. Is able to perform scooping cereal for 75% of the time. Pt. required assist, and support at the left elbow, and Pt. presents with limited forearm supination when using the spoon, and bringing it towards his mouth. Pt. Is able to use a fork to spear items, and perform the hand to mouth pattern.  Eval: Pt. is able to hold standard standard utensils. Pt. Performs as much of the task as he, can and has assistance for the remainder. Goal status:   Ongoing  4.  Pt. Will improve grasp patterns and consistently grasp 1/4" objects for ADL, and IADL tasks.  Baseline: 03/08/23: 1/2" objects efficiently; 02/13/2023: 1/2" objects efficiently 01/09/2023: Continue 12/28/2022: Continues to grasp 1/4" pegs with min a + visual cues,  consistently grasping 1/2" objects with visual cues 11/16/2022:  Continues to grasp 1/4" pegs with min a + visual cues, consistently grasping 1/2" objects with visual cues 09/26/2022: Continues to grasp 1/4" pegs with min a + visual cues, consistently grasping 1/2" objects with visual cues. 08/15/2022: grasps 1/4" pegs with min a + visual cues, consistently grasping 1/2" objects with visual cues. 07/20/22: grasps 1/4" pegs with min a + visual cues, consistently grasping 1/2" objects with visual cues. 05/25/2022: Pt. Is working on improving consistency of grasping 1/2" objects with visual cues.  03/23/2022: Pt. Is able to grasp 1" objects consistently,and continues to work on the hand patterns needed to grasp 1/2" objects.03/21/2022: Pt. Is able to grasp 1" objects consistently,and continues to work on the hand patterns needed to grasp 1/2" objects. Eval: Pt. is able to grasp 1" objects intermittently using a lateral grasp pattern. Goal status: Ongoing  5.  Pt. will independently write his name legibly with letter sizes under 1". Baseline: 03/08/23: Not addressed this period; 02/13/2023: Continue 01/09/2023: Continue. 12/28/2022: Pt. is now using an IPad Pen. 11/16/2022: Pt. Continues to be able to write name with smaller letter size. Pt. Is signing name with a computer styus. Pt. Requires visual cues, and assist 09/26/2022: Pt. Continues to be able to write name with smaller letter size. Pt. Is signing name with a computer styus. Pt. Requires visual cues, and assist. 08/15/2022: Pt. Is able to write name with smaller letter size. Pt. Is signing name with a computer styus. Pt. Requires visual cues, and assist. 07/20/22: stabilizing assist to write name with 75% legibility with 2" letters. 05/25/2022: Pt. to continue to work towards formulating grasp patterns in preparation for grasping a large width pen. Pt. Requires visual cues. 03/23/2022: Pt. Is able to write his name with modA, however has difficulty with formulating  letter sizes less than 2" in size with 50% legibility for the 3 letters of his name.03/21/2022: Pt. Is able to write his name with modA, however has difficulty with formulating letter sizes less than 2" in size with 50% legibility for the 3 letters of his name. Eval: Pt is able to hold a thin marker with his left hand, and formulate a line, and initiate a circular pattern (Pt. without glasses today) Goal status: Ongoing  6. Pt. Will reach up to comb/brush his hair with minA.  Baseline: 03/08/23: Pt uses pick in L hand to reach 75% of his head; unable to reach R side of head; 02/13/2023:Pt. is able to use a hair pick with the left hand on the right side of his head, top, and back of the head. Pt. Continues to have difficulty reaching further to the right side of his head with the left. 01/09/2023: Continue 12/28/2022: Pt. Is progressing, and is now able to use a hair pick with the left hand on the right side of his head, top, and back of the head. Pt. Continues to have difficulty reaching further to the right side of his head with the left. 11/16/2022: Pt. Is able to use a hair pick for the left side of his head using his left hand. Pt. Presents with difficulty reaching to the right side of his head.09/26/2022: Pt. Is able to use a hair pick for the left side of his head using his left hand. Pt. Presents with difficulty reaching to the right side of his head. 5/13/202: 75% using a hair pick, assist to the right side of the head. 07/20/22: reaches 75% of head, assist for far R side of head, fatigues quickly. 05/25/2022:  Pt. Is able to reach up with the left hand to the left side, top, and back of his head. 03/23/2022: Pt. is now able to more consistently initiate reaching up to his head with his left hand in preparation for haircare12/18/2023: Pt. is now able to more consistently initiate reaching up to his head with his left hand in preparation for haircare. Eval: Pt. is able to initiate reaching up for hair care with  a long handled brush, however is unable to sustain UE's in elevation to perform the task.     Goal status: Ongoing  7. Pt. Will independently navigate the w/c through his environment with minA with visual scanning, and hand placement on the controls.  Baseline: 11/16/2022: Deferred 2/2 vision 09/26/2022: Pt. Is now navigating his w/c ouside the home, and around his block. 08/15/2022: Pt. Requires minA to setup hand on controls and MIN cues to navigate the w/c in wide spaces, requires MIN - MOD cues to navigate the w/c through more narrow doorways, and tighter turns - varies based on good vs bad vision days.07/20/22: Pt. Requires minA to setup hand on controls and MIN cues to navigate the w/c in wide spaces, requires MIN - MOD cues to navigate the w/c through more narrow doorways, and tighter turns - varies based on good vs bad vision days. 05/25/2022: Pt. Requires minA  and  cues to navigate the w/c in wide spaces, and requires Mod cues to navigate the w/c through more narrow doorways, and tighter turns. Pt. Requires max cues for scanning through the environment, and moderate cues for hand placement on the controls.     Goal status: Deferred 2/2 vision    8. Pt. Will improve bilateral grip strength to be able to independently grasp, and pull up blankets, and linens.   Baseline: 03/08/23: R grip 0-5 lbs, L grip 8 lbs; pt reports he can consistently pull up blankets and linens independently.  02/13/2023: Continue. Pt. presents with digit PIP, and DIP flexor tightness. 01/09/2023: Continue 12/28/2023: R: 0#, L: 5# Pt. Is now able to more consistently grasp and pull blankets up over him. 11/16/2022: R: 0 L: 8# Pt. Is more consistently grasping his blankets and attempting to pull them up. 09/26/2022: R: 0  L: 8# Pt. is attempting to grasp, and pull blankets up more, using mostly the left hand. 08/15/2022: Pt. has difficulty securely holding, and pulling up blankets, and linens.    Goal status: Ongoing  9. Pt. will  consistently actively control the releasing of blankets, covers, and linens from his hands once they are in the desired position over him. Baseline:12/28/2022: Pt. Is consistently actively able to release linens once they are in the desired position over him. 11/16/2022: Pt. continues to improve with actively releasing blankets form his hands. 09/26/2022: Pt. is improving with actively releasing blankets form his hands. 08/15/2022: Pt. has difficulty with controlled releasing of blankets/linens from his grasp.     Goal status: Achieved    10. Pt. Will improve bilateral UE strength by 2 MM grades to assist with ADLs, and IADLs  Baseline: 03/13/2023: Right: shoulder flexion: 3/5, abduction: 4/5, elbow flexion: 4+/5 , extension: 4-/5 wrist extension: 2/5; Left: shoulder flexion: 4+/5, abduction: 4+/5, elbow flexion: 5/5 , extension: 4/5  wrist extension: 3/5 03/08/23: NT this date d/t as pt arrived late; 02/13/2023: Continue 01/09/2023: Continue 12/28/2022: Right: shoulder flexion: 3/5, abduction: 4/5, elbow flexion: 4+/5 , extension: 4-/5 wrist extension: 2/5; Left: shoulder flexion: 4+/5, abduction: 4+/5, elbow flexion: 5/5 ,  extension: 4/5  wrist extension: 3/5 Right: shoulder flexion: 3+/5, abduction: 4/5, elbow flexion: 4+/5 , extension: 4+/5 wrist extension: 2/5; Left: shoulder flexion: 4+/5, abduction: 4+/5, elbow flexion: 5/5 , extension: 4+/5 wrist extension: 3-/5 11/16/2022: Right: shoulder flexion: 3/5, abduction: 4/5, elbow flexion: 4+/5 , extension: 4+/5 wrist extension: 2/5; Left: shoulder flexion: 4+/5, abduction: 4+/5, elbow flexion: 5/5 , extension:  wrist extension: 3-/5 Right: shoulder flexion: 3+/5, abduction: 4/5, elbow flexion: 4+/5 , extension: 4+/5 wrist extension: 2/5; Left: shoulder flexion: 4+/5, abduction: 4+/5, elbow flexion: 5/5 , extension: 4+/5 wrist extension: 3-/5 Goal status: Ongoing  11. Pt. Will improve right wrist extension by 10 degrees to initiate reaching for items in  preparation for ADLs. Baseline: 03/13/23: -22(35) 03/08/23: NT this date as pt arrived late; 02/13/2023: Continue 01/09/2023: Continue 12/28/2022: -22(34) 11/16/2022: -30(10)    Goal status: Progressing   ASSESSMENT:  CLINICAL IMPRESSION:  Pt. Reports feeling better today. Pt. Shared a video of him working on doffing his shirt  at home Pt. Was able to independently using his left hand to move it over his right elbow with increased time. Pt. was able to complete more pegs on the Box, and Blocks Test today. Pt. present with improved grasping with the right hand, as well as improved elbow extension reps. Pt. continues to be highly motivated to continue working towards improving BUE functioning, ROM, strength, motor control, [facility] skills, as well as visual compensatory strategies in order to maximize independence with daily ADLs, and IADL functioning.      PERFORMANCE DEFICITS in functional skills including ADLs, IADLs, coordination, dexterity, proprioception, ROM, strength, pain, FMC, GMC, decreased knowledge of use of DME, and UE functional use, cognitive skills including safety awareness, and psychosocial skills including coping strategies, environmental adaptation, habits, and routines and behaviors.   IMPAIRMENTS are limiting patient from ADLs, IADLs, leisure, and social participation.   COMORBIDITIES may have co-morbidities  that affects occupational performance. Patient will benefit from skilled OT to address above impairments and improve overall function.  MODIFICATION OR ASSISTANCE TO COMPLETE EVALUATION: Maximum or significant modification of tasks or assist is necessary to complete an evaluation.  OT OCCUPATIONAL PROFILE AND HISTORY: Comprehensive assessment: Review of records and extensive additional review of physical, cognitive, psychosocial history related to current functional performance.  CLINICAL DECISION MAKING: High - multiple treatment options, significant modification of task  necessary  REHAB POTENTIAL: Fair    EVALUATION COMPLEXITY: High    PLAN: OT FREQUENCY: 2 x's a week  OT DURATION:12 weeks  PLANNED INTERVENTIONS: self care/ADL training, therapeutic exercise, therapeutic activity, neuromuscular re-education, manual therapy, passive range of motion, patient/family education, and cognitive remediation/compensation RECOMMENDED OTHER SERVICES: PT  CONSULTED AND AGREED WITH PLAN OF CARE: Patient and family member/caregiver  PLAN FOR NEXT SESSION: Review HEP  Olegario Messier, MS, OTR/L  03/20/2023

## 2023-03-20 NOTE — Therapy (Signed)
OUTPATIENT PHYSICAL THERAPY TREATMENT      Patient Name: Paul Hall MRN: [id number] DOB:[birth year], 27 y.o., male Today's Date: 03/21/2023  PCP:  Cindi Carbon PROVIDER:  Lenise Herald, PA-C   PT End of Session - 03/20/23 1533     Visit Number 141    Number of Visits 160   date corrected from most recent recert   Date for PT Re-Evaluation 05/14/22   Date corrected from most recent recert   Authorization Type [employer] COMM Pro/  Medicaid    Authorization Time Period 01/04/21-03/29/21; Recert 03/24/2021-06/16/2021; Recert 09/15/2021- 12/08/2021; Recert 12/13/2021-03/07/2022    Progress Note Due on Visit 150    PT Start Time 1530    PT Stop Time 1614    PT Time Calculation (min) 44 min    Equipment Utilized During Treatment Gait belt    Activity Tolerance Patient tolerated treatment well    Behavior During Therapy WFL for tasks assessed/performed                      Past Medical History:  Diagnosis Date   Diabetes mellitus (HCC)    Hypertension    Stroke (HCC)    Past Surgical History:  Procedure Laterality Date   IVC FILTER PLACEMENT ([facility] HX)     LEG SURGERY     PEG TUBE PLACEMENT     TRACHEOSTOMY     Patient Active Problem List   Diagnosis Date Noted   Sepsis due to vancomycin resistant Enterococcus species (HCC) 06/06/2019   SIRS (systemic inflammatory response syndrome) (HCC) 06/05/2019   Acute lower UTI 06/05/2019   VRE (vancomycin-resistant Enterococci) infection 06/05/2019   Anemia 06/05/2019   Skin ulcer of sacrum with necrosis of muscle (HCC)    Urinary retention    Type 2 diabetes mellitus without complication, with long-term current use of insulin (HCC)    Tachycardia    Lower extremity edema    Acute metabolic encephalopathy    Obstructive sleep apnea    Morbid obesity with BMI of 60.0-69.9, adult (HCC)    Goals of care, counseling/discussion    Palliative care encounter    Sepsis (HCC) 04/27/2019   H/O insulin dependent diabetes  mellitus 04/27/2019   History of CVA with residual deficit 04/27/2019   Seizure disorder (HCC) 04/27/2019   Decubitus ulcer of sacral region, stage 4 (HCC) 04/27/2019    REFERRING DIAG: Cerebral infarction, unspecified   THERAPY DIAG:  Muscle weakness (generalized)  Other lack of coordination  Difficulty in walking, not elsewhere classified  Unsteadiness on feet  Abnormality of gait and mobility  Other abnormalities of gait and mobility  History of CVA with residual deficit  Rationale for Evaluation and Treatment Rehabilitation  PERTINENT HISTORY: Paul Hall is a 26yoM who presents with severe weakness, quadriparesis, altered sensorium, and visual impairment s/p critical illness and prolonged hospitalization. Pt hospitalized in October 2020 with ARDS 2/2 COVID19 infection. Pt sustained a complex and lengthy hospitalization which included tracheostomy, prolonged sedation, ECMO. In this period pt sustained CVA and SDH. Pt has now been liberated from tracheostomy and G-tube. Pt has since been hospitalized for wound infection and UTI. Pt lives with parents at home, has hospital bed and left chair, hoyer lift transfers, and power WC for mobility needs. Pt needs heavy physical assistance with ADL 2/2 BUE contractures and motor dysfunction   PRECAUTIONS: Fall  SUBJECTIVE:  Pt reported feeling well overall without any issues today.     PAIN:  Are you having pain?  No   TODAY'S TREATMENT:  - 03/21/23     Therapeutic Exercises: (performed in wheelchair today)  Seated  Marches (AROM)  3x10 each LE Seated knee ext (AROM on right and 3# AW on left) 3 sets of 10 reps Gluteal sets - hold 5 sec x 15 AROM ham curls 3 x 10 reps  Manual resistive hip add/abd 3 x 10 reps AAROM hip abd (to achieve more ROM) x 10 reps each       PATIENT EDUCATION: Education details: Exercise technique with VC   HOME EXERCISE PROGRAM:  Access Code: [email or web address] URL:  [email or web address] Date: 03/23/2022 Prepared by: Maureen Ralphs  Exercises - Supine Bridge  - 3 x weekly - 3 sets - 10 reps - 2 hold - Supine Gluteal Sets  - 3 x weekly - 3 sets - 10 reps - 5 sec hold - Supine Quad Set  - 1 x daily - 3 x weekly - 3 sets - 10 reps - 5 hold - Seated Long Arc Quad  - 1 x daily - 7 x weekly - 3 sets - 10 reps - Seated Hip Adduction Squeeze with Ball  - 1 x daily - 3 x weekly - 3 sets - 10 reps - 5 hold - Seated Hip Abduction  - 1 x daily - 3 x weekly - 3 sets - 10 reps - 2 hold    PT Short Term Goals -       PT SHORT TERM GOAL #1   Title Pt will be independent with HEP in order to improve strength and balance in order to decrease fall risk and improve function at home and work.    Baseline 01/04/2021= No formal HEP in place; 12/12 no HEP in place; 05/10/2021-Patient and his father were able to report compliance with curent HEP consisting of mostly seated/reclined LE strengthening. Both verbalize no questions at this time.    Time 6    Period Weeks    Status Achieved    Target Date 02/15/21            PT LONG TERM GOAL #1     Title Patient will increase BLE gross strength by 1/2 muscle grade to improve functional strength for improved independence with potential gait, increased standing tolerance and increased ADL ability.     Baseline 01/04/2021- Patient presents with 1/5 to 3-/5 B LE strength with MMT; 12/12: goal partially met for Left knee/hip; 05/10/2021= 2-/5 bilateal Hip flex; 3+/5 bilateral Knee ext; 06/21/2021= Patient presents with 2-/5 bilateral Hip flex; 3+/5 bilateral knee ext/flex; 2-/5 left ankle DF; 0/5 right ankle- and able to increase reps and resistance with LE's. 09/15/2021- Patient technically presents with 2-/5 B hip flex/abd/add - but he is able to raise his hip up to approx 100 deg which has improved. 3+/5 Bilateral knee ext, 2-/5 left ankle and 0/5 right ankle.  12/08/2021= Patient able to lift left knee at 110 deg of  hip flex; presents with 3+/5 knee ext, 2-/5 left ankle DF and 0/5 right ankle DF, 2-/5 bilateral Hip abd in seated position.    12/6: R: knee 3+/5 ext, 2/5 flexion, left knee 3+/5 extension, 3+/5 flexion, R hip: 2+/5 hip add, 2+/5 hip ABD L hip: 4-/5 hip ABD, 3+/5 hip ADD, 3+/5 hip flexion; 06/06/2022= Patient now presents with 2-/5 right ankle DF/PF;     Time 12     Period Weeks     Status MET    Target Date 03/07/2022  PT LONG TERM GOAL #2    Title Patient will tolerate sitting unsupported demonstrating erect sitting posture for 15 minutes with CGA to demonstrate improved back extensor strength and improved sitting tolerance.     Baseline 01/04/2021- Patient confied to sitting in lift chair or electric power chair with back support and unable to sit upright without physical assistance; 12/12: tolerates <1 minutes upright unsupported sitting. 05/10/2021=static sit with forward trunk lean  in his power wheelchair without back support x approx 3 min. 06/21/2021=Unable to assess today due to patient with acute back pain but on previous visit able to sit x 8 min without back support. 09/15/2021- on last visit- 09/13/2021- patient was able to sit unsupported x 8 min at edge of mat. 10/13/2023 - Patient was able to sit at edge of mat with varying level of assist today from SBA to min A for a total of 20 min. 12/13/2021= Patient demonstrated unsupported sitting at edge of mat for approx 20 min    Time 12     Period Weeks     Status GOAL MET    Target Date 12/08/21          PT LONG TERM GOAL #3    Title Patient will demonstrate ability to perform static standing in // bars > 2 min with Max Assist  without loss of balance and fair posture for improved overall strength for pre-gait and transfer activities.     Baseline 01/04/2021= Patient current uanble to stand- Dependent on hoyer or sit to stand lift for transfers. 05/10/2021=Not appropriate yet- Currently still dependent with all transfers using hoyer.  06/21/2021= Patient continuing now to focus on LE strengthening to prepare for standing-unable to try today due to acute low back pain-  planning on attempting in new cert period. 09/15/2021- Patient has attempted standing 2x in past two week- max Assist of 2 people - only once was he successful to clearing his bottom from chair - Will continue to be a focus during the new certification. 12/13/2021= Patient has been limited secondary to increased overall low back pain during this certification and will require more time to focus on this goal.  12/6: not assessed this date, will assess at date when 2-3 PTs are present for assistance     Time 12     Period Weeks     Status GOAL not appropriate at this time - may attempt in future once Patient presents with improved overall LE strength.                 PT LONG TERM GOAL #4    Title Pt will improve FOTO score by 10 points or more demonstrating improved perceived functional ability     Baseline FOTO 7 on 10/17; 03/15/21: FOTO 12; 05/10/2021 06/21/2021= 1; 09/15/2021= 9; 12/13/2021= Will issue next visit 12/6: 4; 06/06/2022= Will assess next visit; Goal not appropriate as patient is not ambulatory.     Time 12     Period Weeks     Status WITHDRAWN    Target date 11/30/2022         PT LONG TERM GOAL #5    Title Patient will perform sit to stand transfer with appropriate AD and max assist of 2 people with 75% consistency to prepare for pregait activities.     Baseline 09/15/2021= Patient unable to stand well- unable to clear his bottom off chair with Max assist of 2 persons. 12/13/2021- Goal not appropriate to try yet but will keep  and roll over to next cert as shift continues to focus on transfers/standing; 4/24= Patient able to perform active ankle DF/PF with right LE and able to raise his knee into seated march and clear floor without physical assist today - as previously unable as well as lift right knee ext to near full ROM to improve strength for eventual  standing. 09/07/2022- Goal continues to not be appropriate but patient is now standing some and will keep goal active; 11/23/2022= patient is now performing sit to stand with max A +2 and now able to clear his bottom off mat. 11/30/2022- Patient continues to perform sit to stand transfers with Max A+2 and able to clear bottom well off mat but not erect yet-     Time 12     Period Weeks     Status PROGRESSING    Target date  02/22/2023       PT LONG TERM GOAL #6  Title Patient will tolerate sitting unsupported demonstrating erect sitting posture for 30 minutes with CGA to demonstrate improved back extensor strength and improved sitting tolerance.   Baseline 12/13/2021= Patient demonstrated unsupported sitting at edge of mat for approx 20 min;  12/29/2021- Patient performed approx 30 min of dynamic sitting activities today. 06/06/2022= Patient demonstrated ability to sit and perform static and dynamic UE/LE movement with only Supervision.   Time 12   Period Weeks   Status GOAL MET           7.  Patient will tolerate 2 minutes or more of standing in sit to stand lift or with max assist +2 in order to indicate improved lower extremity weightbearing tolerance for progression to standing in parallel bars. Baseline: 1 minute on most recent stand 02/21/22; 06/06/2022- Patient did attempt today after complaining of right LE pain last week. He attempted 3 stands using sit to stand lift. - all over 1 min- last one approx 48 sec- stopped due to fatigue. More erect standing in lift today - still poor gluteal strength but able to activate glutes and extend hips upon command but unable to hold > 5 sec. 07/28/2022- Will attempt standing next visit but patient has been able to stand since last progress note for up to 3 min at a time at his best. 09/07/2022-  attempted standing with max +2 and patient able to stand 15-20 sec so will now  revise goal; 11/23/2022- Patient at his best between last 2 visits was able to stand  approx 50 sec with max A+2.  11/30/2022- Patient performed 4 trials of static standing - max A +2- clearing bottom and using forearms to bear weight  03/15/2023: Patient performed 2 trials of static standing - max A +2- clearing edge of mat and using forearms to bear weight for approximately 45 seconds, is able to come into approximately 45 degrees of knee flexion from sit to stand Goal status: PROGRESSING Target date: 02/22/2023   8. Patient will tolerate sitting unsupported demonstrating ability to perform dynamic UE/LE activities for 30 minutes  independently to demonstrate ability to sit at edge of bed to eat or perform some ADL's/exercise for optimal quality of life.             Baseline: 06/06/2022- Patient able to sit and perform some UE/LE exercises but requires CGA at times for safety- he is able to static sit for 30 min with supervision. 07/27/2022= Patient able to now sit > 30 min with Supervision only - performing static and dynamic activities.  Will keep goal active to ensure patient is consistent. 09/07/2022=  Patient able to demonstrate consistent ability to sit at edge of mat during visits             Goal Status: MET             Target date: 08/29/2022        Plan     Clinical Impression Statement Patient presents with good motivation for today's visit. Treatment focused on continuing to build LE strength to assist with standing. He was able to build up to 3 sets today and add some resistive strengthening with good results- able to complete all reps today.    Pt will continue to benefit from skilled physical therapy intervention to address impairments, improve QOL, and attain therapy goals.    Personal Factors and Comorbidities Comorbidity 3+;Time since onset of injury/illness/exacerbation    Comorbidities CVA, diabetes, Seizures    Examination-Activity Limitations Bathing;Bed Mobility;Bend;Caring for Others;Carry;Dressing;Hygiene/Grooming;Lift;Locomotion Level;Reach Overhead;Self  Feeding;Sit;Squat;Stairs;Stand;Transfers;Toileting    Examination-Participation Restrictions Cleaning;Community Activity;Driving;Laundry;Medication Management;Meal Prep;Occupation;Personal Finances;Shop;Yard Work;Volunteer    Stability/Clinical Decision Making Evolving/Moderate complexity    Rehab Potential Fair    PT Frequency 2x / week    PT Duration 12 weeks    PT Treatment/Interventions ADLs/Self Care Home Management;Cryotherapy;Electrical Stimulation;Moist Heat;Ultrasound;DME Instruction;Gait training;Stair training;Functional mobility training;Therapeutic exercise;Balance training;Patient/family education;Orthotic Fit/Training;Neuromuscular re-education;Wheelchair mobility training;Manual techniques;Passive range of motion;Dry needling;Energy conservation;Taping;Visual/perceptual remediation/compensation;Joint Manipulations    PT Next Visit Plan core strength/motor control in short-sitting, sit to stand if able; Continue with progressive LE Strengthening. Stand activities as appropriate.     PT Home Exercise Plan No changes to HEP today    Consulted and Agree with Plan of Care Patient;Family member/caregiver    Family Member Consulted             03/09/2023, 8:04 AM      1:12 PM, 03/21/23 Louis Meckel, PT Physical Therapist - [facility]  Outpatient Physical Therapy- [facility] [phone]

## 2023-03-21 DIAGNOSIS — I6389 Other cerebral infarction: Secondary | ICD-10-CM | POA: Diagnosis not present

## 2023-03-21 DIAGNOSIS — R32 Unspecified urinary incontinence: Secondary | ICD-10-CM | POA: Diagnosis not present

## 2023-03-21 DIAGNOSIS — L8994 Pressure ulcer of unspecified site, stage 4: Secondary | ICD-10-CM | POA: Diagnosis not present

## 2023-03-21 NOTE — Addendum Note (Signed)
Addended by: Lenda Kelp on: 03/21/2023 08:53 AM   Modules accepted: Orders

## 2023-03-22 ENCOUNTER — Ambulatory Visit: Payer: BC Managed Care – PPO | Admitting: Occupational Therapy

## 2023-03-22 ENCOUNTER — Ambulatory Visit: Payer: BC Managed Care – PPO

## 2023-03-22 DIAGNOSIS — R2681 Unsteadiness on feet: Secondary | ICD-10-CM

## 2023-03-22 DIAGNOSIS — R278 Other lack of coordination: Secondary | ICD-10-CM

## 2023-03-22 DIAGNOSIS — M6281 Muscle weakness (generalized): Secondary | ICD-10-CM

## 2023-03-22 DIAGNOSIS — R269 Unspecified abnormalities of gait and mobility: Secondary | ICD-10-CM

## 2023-03-22 DIAGNOSIS — R262 Difficulty in walking, not elsewhere classified: Secondary | ICD-10-CM

## 2023-03-22 DIAGNOSIS — R2689 Other abnormalities of gait and mobility: Secondary | ICD-10-CM | POA: Diagnosis not present

## 2023-03-22 DIAGNOSIS — I6930 Unspecified sequelae of cerebral infarction: Secondary | ICD-10-CM | POA: Diagnosis not present

## 2023-03-22 NOTE — Therapy (Signed)
OUTPATIENT PHYSICAL THERAPY TREATMENT      Patient Name: Paul Hall MRN: [id number] DOB:January 19, 1996, 27 y.o., male Today's Date: 03/23/2023  PCP:  Cindi Carbon PROVIDER:  Lenise Herald, PA-C   PT End of Session - 03/22/23 0859     Visit Number 142    Number of Visits 160   date corrected from most recent recert   Date for PT Re-Evaluation 05/14/22   Date corrected from most recent recert   Authorization Type [employer]/ [place] [employer]    Authorization Time Period 01/04/21-03/29/21; Recert 03/24/2021-06/16/2021; Recert 09/15/2021- 12/08/2021; Recert 12/13/2021-03/07/2022    Progress Note Due on Visit 150    PT Start Time 1535    PT Stop Time 1614    PT Time Calculation (min) 39 min    Equipment Utilized During Treatment Gait belt    Activity Tolerance Patient tolerated treatment well    Behavior During Therapy WFL for tasks assessed/performed                       Past Medical History:  Diagnosis Date   Diabetes mellitus (HCC)    Hypertension    Stroke (HCC)    Past Surgical History:  Procedure Laterality Date   IVC FILTER PLACEMENT ([facility] HX)     LEG SURGERY     PEG TUBE PLACEMENT     TRACHEOSTOMY     Patient Active Problem List   Diagnosis Date Noted   Sepsis due to vancomycin resistant Enterococcus species (HCC) 06/06/2019   SIRS (systemic inflammatory response syndrome) (HCC) 06/05/2019   Acute lower UTI 06/05/2019   VRE (vancomycin-resistant Enterococci) infection 06/05/2019   Anemia 06/05/2019   Skin ulcer of sacrum with necrosis of muscle (HCC)    Urinary retention    Type 2 diabetes mellitus without complication, with long-term current use of insulin (HCC)    Tachycardia    Lower extremity edema    Acute metabolic encephalopathy    Obstructive sleep apnea    Morbid obesity with BMI of 60.0-69.9, adult (HCC)    Goals of care, counseling/discussion    Palliative care encounter    Sepsis (HCC) 04/27/2019   H/O insulin dependent diabetes  mellitus 04/27/2019   History of CVA with residual deficit 04/27/2019   Seizure disorder (HCC) 04/27/2019   Decubitus ulcer of sacral region, stage 4 (HCC) 04/27/2019    REFERRING DIAG: Cerebral infarction, unspecified   THERAPY DIAG:  Muscle weakness (generalized)  Other lack of coordination  Difficulty in walking, not elsewhere classified  Unsteadiness on feet  Abnormality of gait and mobility  Other abnormalities of gait and mobility  History of CVA with residual deficit  Rationale for Evaluation and Treatment Rehabilitation  PERTINENT HISTORY: Paul Hall is a 26yoM who presents with severe weakness, quadriparesis, altered sensorium, and visual impairment s/p critical illness and prolonged hospitalization. Pt hospitalized in October 2020 with ARDS 2/2 COVID19 infection. Pt sustained a complex and lengthy hospitalization which included tracheostomy, prolonged sedation, ECMO. In this period pt sustained CVA and SDH. Pt has now been liberated from tracheostomy and G-tube. Pt has since been hospitalized for wound infection and UTI. Pt lives with parents at home, has hospital bed and left chair, hoyer lift transfers, and power WC for mobility needs. Pt needs heavy physical assistance with ADL 2/2 BUE contractures and motor dysfunction   PRECAUTIONS: Fall  SUBJECTIVE:  Patient reports having a good day- no pain and states working at home on his strength.  PAIN:  Are you having pain? No   TODAY'S TREATMENT:  - 03/23/23     TODAY'S TREATMENT:  - 03/22/23     *Dependent transfer from w/c to edge of mat using hoyer lift (+2 person assist)   Therapeutic Exercises: (at edge of mat)   Sitting Marches 2x10 each LE LAQ 2x10 each LE Seated calf raises (minimal active movement on right) 2 x 10 reps  Hip abd/add each LE x 10 reps  Seated ham curls with RTB 2 sets of 10 reps    Therapeutic activities: (at edge of mat)   Static sitting with dynamic head turns - able to  have conversation with PT with PT moving around and patient following him with head as he moved from left to right side.  Static standing trials x 3 - 14-36 sec (max effort from patient and max assist +2 to remain in standing). Pt demonstrated difficulty with straightening right knee knee and increased overall physical support today.   PATIENT EDUCATION: Education details: Exercise technique with VC   HOME EXERCISE PROGRAM:  Access Code: [email or web address] URL: [email or web address] Date: 03/23/2022 Prepared by: Maureen Ralphs  Exercises - Supine Bridge  - 3 x weekly - 3 sets - 10 reps - 2 hold - Supine Gluteal Sets  - 3 x weekly - 3 sets - 10 reps - 5 sec hold - Supine Quad Set  - 1 x daily - 3 x weekly - 3 sets - 10 reps - 5 hold - Seated Long Arc Quad  - 1 x daily - 7 x weekly - 3 sets - 10 reps - Seated Hip Adduction Squeeze with Ball  - 1 x daily - 3 x weekly - 3 sets - 10 reps - 5 hold - Seated Hip Abduction  - 1 x daily - 3 x weekly - 3 sets - 10 reps - 2 hold    PT Short Term Goals -       PT SHORT TERM GOAL #1   Title Pt will be independent with HEP in order to improve strength and balance in order to decrease fall risk and improve function at home and work.    Baseline 01/04/2021= No formal HEP in place; 12/12 no HEP in place; 05/10/2021-Patient and his father were able to report compliance with curent HEP consisting of mostly seated/reclined LE strengthening. Both verbalize no questions at this time.    Time 6    Period Weeks    Status Achieved    Target Date 02/15/21            PT LONG TERM GOAL #1     Title Patient will increase BLE gross strength by 1/2 muscle grade to improve functional strength for improved independence with potential gait, increased standing tolerance and increased ADL ability.     Baseline 01/04/2021- Patient presents with 1/5 to 3-/5 B LE strength with MMT; 12/12: goal partially met for Left knee/hip; 05/10/2021= 2-/5 bilateal Hip flex;  3+/5 bilateral Knee ext; 06/21/2021= Patient presents with 2-/5 bilateral Hip flex; 3+/5 bilateral knee ext/flex; 2-/5 left ankle DF; 0/5 right ankle- and able to increase reps and resistance with LE's. 09/15/2021- Patient technically presents with 2-/5 B hip flex/abd/add - but he is able to raise his hip up to approx 100 deg which has improved. 3+/5 Bilateral knee ext, 2-/5 left ankle and 0/5 right ankle.  12/08/2021= Patient able to lift left knee at 110 deg of hip flex; presents with 3+/5 knee ext, 2-/5 left  ankle DF and 0/5 right ankle DF, 2-/5 bilateral Hip abd in seated position.    12/6: R: knee 3+/5 ext, 2/5 flexion, left knee 3+/5 extension, 3+/5 flexion, R hip: 2+/5 hip add, 2+/5 hip ABD L hip: 4-/5 hip ABD, 3+/5 hip ADD, 3+/5 hip flexion; 06/06/2022= Patient now presents with 2-/5 right ankle DF/PF;     Time 12     Period Weeks     Status MET    Target Date 03/07/2022         PT LONG TERM GOAL #2    Title Patient will tolerate sitting unsupported demonstrating erect sitting posture for 15 minutes with CGA to demonstrate improved back extensor strength and improved sitting tolerance.     Baseline 01/04/2021- Patient confied to sitting in lift chair or electric power chair with back support and unable to sit upright without physical assistance; 12/12: tolerates <1 minutes upright unsupported sitting. 05/10/2021=static sit with forward trunk lean  in his power wheelchair without back support x approx 3 min. 06/21/2021=Unable to assess today due to patient with acute back pain but on previous visit able to sit x 8 min without back support. 09/15/2021- on last visit- 09/13/2021- patient was able to sit unsupported x 8 min at edge of mat. 10/13/2023 - Patient was able to sit at edge of mat with varying level of assist today from SBA to min A for a total of 20 min. 12/13/2021= Patient demonstrated unsupported sitting at edge of mat for approx 20 min    Time 12     Period Weeks     Status GOAL MET    Target Date  12/08/21          PT LONG TERM GOAL #3    Title Patient will demonstrate ability to perform static standing in // bars > 2 min with Max Assist  without loss of balance and fair posture for improved overall strength for pre-gait and transfer activities.     Baseline 01/04/2021= Patient current uanble to stand- Dependent on hoyer or sit to stand lift for transfers. 05/10/2021=Not appropriate yet- Currently still dependent with all transfers using hoyer. 06/21/2021= Patient continuing now to focus on LE strengthening to prepare for standing-unable to try today due to acute low back pain-  planning on attempting in new cert period. 09/15/2021- Patient has attempted standing 2x in past two week- max Assist of 2 people - only once was he successful to clearing his bottom from chair - Will continue to be a focus during the new certification. 12/13/2021= Patient has been limited secondary to increased overall low back pain during this certification and will require more time to focus on this goal.  12/6: not assessed this date, will assess at date when 2-3 PTs are present for assistance     Time 12     Period Weeks     Status GOAL not appropriate at this time - may attempt in future once Patient presents with improved overall LE strength.                 PT LONG TERM GOAL #4    Title Pt will improve FOTO score by 10 points or more demonstrating improved perceived functional ability     Baseline FOTO 7 on 10/17; 03/15/21: FOTO 12; 05/10/2021 06/21/2021= 1; 09/15/2021= 9; 12/13/2021= Will issue next visit 12/6: 4; 06/06/2022= Will assess next visit; Goal not appropriate as patient is not ambulatory.     Time 12  Period Weeks     Status WITHDRAWN    Target date 11/30/2022         PT LONG TERM GOAL #5    Title Patient will perform sit to stand transfer with appropriate AD and max assist of 2 people with 75% consistency to prepare for pregait activities.     Baseline 09/15/2021= Patient unable to stand well- unable to  clear his bottom off chair with Max assist of 2 persons. 12/13/2021- Goal not appropriate to try yet but will keep and roll over to next cert as shift continues to focus on transfers/standing; 4/24= Patient able to perform active ankle DF/PF with right LE and able to raise his knee into seated march and clear floor without physical assist today - as previously unable as well as lift right knee ext to near full ROM to improve strength for eventual standing. 09/07/2022- Goal continues to not be appropriate but patient is now standing some and will keep goal active; 11/23/2022= patient is now performing sit to stand with max A +2 and now able to clear his bottom off mat. 11/30/2022- Patient continues to perform sit to stand transfers with Max A+2 and able to clear bottom well off mat but not erect yet-     Time 12     Period Weeks     Status PROGRESSING    Target date  02/22/2023       PT LONG TERM GOAL #6  Title Patient will tolerate sitting unsupported demonstrating erect sitting posture for 30 minutes with CGA to demonstrate improved back extensor strength and improved sitting tolerance.   Baseline 12/13/2021= Patient demonstrated unsupported sitting at edge of mat for approx 20 min;  12/29/2021- Patient performed approx 30 min of dynamic sitting activities today. 06/06/2022= Patient demonstrated ability to sit and perform static and dynamic UE/LE movement with only Supervision.   Time 12   Period Weeks   Status GOAL MET           7.  Patient will tolerate 2 minutes or more of standing in sit to stand lift or with max assist +2 in order to indicate improved lower extremity weightbearing tolerance for progression to standing in parallel bars. Baseline: 1 minute on most recent stand 02/21/22; 06/06/2022- Patient did attempt today after complaining of right LE pain last week. He attempted 3 stands using sit to stand lift. - all over 1 min- last one approx 48 sec- stopped due to fatigue. More erect standing  in lift today - still poor gluteal strength but able to activate glutes and extend hips upon command but unable to hold > 5 sec. 07/28/2022- Will attempt standing next visit but patient has been able to stand since last progress note for up to 3 min at a time at his best. 09/07/2022-  attempted standing with max +2 and patient able to stand 15-20 sec so will now  revise goal; 11/23/2022- Patient at his best between last 2 visits was able to stand approx 50 sec with max A+2.  11/30/2022- Patient performed 4 trials of static standing - max A +2- clearing bottom and using forearms to bear weight  03/15/2023: Patient performed 2 trials of static standing - max A +2- clearing edge of mat and using forearms to bear weight for approximately 45 seconds, is able to come into approximately 45 degrees of knee flexion from sit to stand Goal status: PROGRESSING Target date: 02/22/2023   8. Patient will tolerate sitting unsupported demonstrating ability  to perform dynamic UE/LE activities for 30 minutes  independently to demonstrate ability to sit at edge of bed to eat or perform some ADL's/exercise for optimal quality of life.             Baseline: 06/06/2022- Patient able to sit and perform some UE/LE exercises but requires CGA at times for safety- he is able to static sit for 30 min with supervision. 07/27/2022= Patient able to now sit > 30 min with Supervision only - performing static and dynamic activities. Will keep goal active to ensure patient is consistent. 09/07/2022=  Patient able to demonstrate consistent ability to sit at edge of mat during visits             Goal Status: MET             Target date: 08/29/2022        Plan     Clinical Impression Statement Patient presented with mixed results today. He performed better with more activation of all RLE muscles during therex today but struggled more with static stand- might have fatigued himself out prior to standing.  Pt will continue to benefit from skilled  physical therapy intervention to address impairments, improve QOL, and attain therapy goals.    Personal Factors and Comorbidities Comorbidity 3+;Time since onset of injury/illness/exacerbation    Comorbidities CVA, diabetes, Seizures    Examination-Activity Limitations Bathing;Bed Mobility;Bend;Caring for Others;Carry;Dressing;Hygiene/Grooming;Lift;Locomotion Level;Reach Overhead;Self Feeding;Sit;Squat;Stairs;Stand;Transfers;Toileting    Examination-Participation Restrictions Cleaning;Community Activity;Driving;Laundry;Medication Management;Meal Prep;Occupation;Personal Finances;Shop;Yard Work;Volunteer    Stability/Clinical Decision Making Evolving/Moderate complexity    Rehab Potential Fair    PT Frequency 2x / week    PT Duration 12 weeks    PT Treatment/Interventions ADLs/Self Care Home Management;Cryotherapy;Electrical Stimulation;Moist Heat;Ultrasound;DME Instruction;Gait training;Stair training;Functional mobility training;Therapeutic exercise;Balance training;Patient/family education;Orthotic Fit/Training;Neuromuscular re-education;Wheelchair mobility training;Manual techniques;Passive range of motion;Dry needling;Energy conservation;Taping;Visual/perceptual remediation/compensation;Joint Manipulations    PT Next Visit Plan core strength/motor control in short-sitting, sit to stand if able; Continue with progressive LE Strengthening. Stand activities as appropriate.     PT Home Exercise Plan No changes to HEP today    Consulted and Agree with Plan of Care Patient;Family member/caregiver    Family Member Consulted             03/09/2023, 8:04 AM      3:14 PM, 03/23/23 Louis Meckel, PT Physical Therapist - [facility]  Outpatient Physical Therapy- [facility] [phone]

## 2023-03-23 NOTE — Therapy (Signed)
Occupational Therapy Neuro Treatment Note   Patient Name: Paul Hall MRN: [id number] DOB:03-Dec-1995, 27 y.o., male Today's Date: 03/23/2023  PCP: Dr. Sherwood Gambler REFERRING PROVIDER: Dr. Sherwood Gambler   OT End of Session - 03/23/23 0808     Visit Number [id number]    Number of Visits 108    Date for OT Re-Evaluation 05/08/23    Authorization Type 09/26/2022    OT Start Time 1615    OT Stop Time 1700    OT Time Calculation (min) 45 min    Equipment Utilized During Treatment tilt in space power wc    Activity Tolerance Patient tolerated treatment well    Behavior During Therapy WFL for tasks assessed/performed                Past Medical History:  Diagnosis Date   Diabetes mellitus (HCC)    Hypertension    Stroke [facility])    Past Surgical History:  Procedure Laterality Date   IVC FILTER PLACEMENT ([facility] HX)     LEG SURGERY     PEG TUBE PLACEMENT     TRACHEOSTOMY     Patient Active Problem List   Diagnosis Date Noted   Sepsis due to vancomycin resistant Enterococcus species (HCC) 06/06/2019   SIRS (systemic inflammatory response syndrome) (HCC) 06/05/2019   Acute lower UTI 06/05/2019   VRE (vancomycin-resistant Enterococci) infection 06/05/2019   Anemia 06/05/2019   Skin ulcer of sacrum with necrosis of muscle (HCC)    Urinary retention    Type 2 diabetes mellitus without complication, with long-term current use of insulin (HCC)    Tachycardia    Lower extremity edema    Acute metabolic encephalopathy    Obstructive sleep apnea    Morbid obesity with BMI of 60.0-69.9, adult (HCC)    Goals of care, counseling/discussion    Palliative care encounter    Sepsis (HCC) 04/27/2019   H/O insulin dependent diabetes mellitus 04/27/2019   History of CVA with residual deficit 04/27/2019   Seizure disorder (HCC) 04/27/2019   Decubitus ulcer of sacral region, stage 4 (HCC) 04/27/2019   ONSET DATE: 01/2019  REFERRING DIAG: CVA/COVID-19  THERAPY DIAG:  Muscle weakness  (generalized)  Other lack of coordination  Rationale for Evaluation and Treatment Rehabilitation  SUBJECTIVE:   SUBJECTIVE STATEMENT:  Pt  reports doing well today.   Pt accompanied by: self and family member  PERTINENT HISTORY:  Pt. is a 27 y.o. male who was diagnosed with COVID-19, and CVA with resultant quadriplegia. Pt. Was then hospitalized with VRE UTI. PMHx includes: urinary retention, seizure disorder, obstructive sleep disorder, DM Type II, Morbid obesity.   PRECAUTIONS: None  WEIGHT BEARING RESTRICTIONS No  PAIN:  03/08/23: 0 pain at rest, 5/10 when stretching in BUEs Are you having pain? Reports right  wrist tightness.  FALLS: Has patient fallen in last 6 months? No  LIVING ENVIRONMENT: Lives with: lives with their family Lives in: House/apartment Stairs: No Level Entry Has following equipment at home: Wheelchair (power) and hoyer lift, sit to stand lift  PLOF: Independent  PATIENT GOALS: To be able to engage in more daily care tasks.  OBJECTIVE:   HAND DOMINANCE: Right  ADLs:  Transfers/ambulation related to ADLs: Eating: Pt. reports being able to hold standard utensils, and is starting to engage more in self-feeding tasks, hand to mouth patterns. Pt. Reports that he does as much as he can with the task, and family assists  with the remainder of the task. Grooming: Pt. Is able  to initiate holding an electric toothbrush, and brush his teeth. Family assists LB Dressing: Total Assist UE dressing: Pt. is now able to reach up to actively assist with grasping , and pulling his gown down. Toileting:  Total Assist Bathing: MaxA UB, Total assist LB Tub Shower transfers: N/A Equipment: See above    IADLs: Shopping: Relies on family to assist Light housekeeping: Total Assist Meal Prep: Total Assist Community mobility:   Medication management:  Total Assist  Financial management: N/A Handwriting: Not legible: Pt. Is able to hold a pen with the left hand, and  initiate marking the page. Pt.'s eye glasses were not available  MOBILITY STATUS:  Power w/c  POSTURE COMMENTS:  Pt. Requires position changes in his power w/c  ACTIVITY TOLERANCE: Activity tolerance:  Fair  FUNCTIONAL OUTCOME MEASURES: FOTO: 40  TR score: 45  05/25/2022:   FOTO: 50 TR score: 45  07/20/22: FOTO 55  03/08/23: 51  UPPER EXTREMITY ROM     Active ROM Left eval Left  03/21/2022 Left 05/25/2022 L  07/20/22 Left 07/25/2022 Left  08/15/2022 Left  09/26/2022 Left  11/16/2022 Left 12/28/2022 Left 03/13/23  Shoulder flexion 119 123 125 130  136 138 130 142 144  Shoulder abduction 110 115 115 115  118 121 125 142 140  Shoulder adduction            Shoulder extension            Shoulder internal rotation            Shoulder external rotation            Elbow flexion 135(135) 145 145 145  145    145  Elbow extension -27(-18) -21(-20) -20(-16)  -25(-10) -23(-10) -21(-10) -20(-18) -20(-18) -20(-17)  Wrist flexion            Wrist extension 10(50) 19(50) 28(50)  30(50) 35(55) 36(55) 36(60) 40(60) 45(60)  Wrist ulnar deviation            Wrist radial deviation            Wrist pronation            Wrist supination   Limited by flexor tone       10(15)  (Blank rows = not tested)    Active ROM Right eval Right 03/21/2022 Right 05/25/2022 R  07/20/22 Right  07/25/22 Right 08/15/2022 Right 09/26/2022 Right 11/16/2022 Right 12/28/2022 Right 03/13/23  Shoulder flexion 106 scaption 105  Scaption 62 flexion 110 scaption 100  105 105 105 112 Scaption 123 Scaption  Shoulder abduction 114 90 97 100  105 117 120 124 124  Shoulder adduction            Shoulder extension            Shoulder internal rotation            Shoulder external rotation            Elbow flexion 120(130) 145 145 140  144 140 142 148 148  Elbow extension -45(-35) -22(-35) -28(-22)  -32(-21) -32(-28) -28(-26) -28(-28) -27(-26) -27(-40)  Wrist flexion            Wrist extension -30(10) -25(10) -10(30)   -10(20) -10(25) -20(40) -30(10) -22(34) -22(35)  Wrist ulnar deviation            Wrist radial deviation            Wrist pronation  Wrist supination   Limited by flexor tone           12/28/2022: Thumb radial abduction: Right: 12(46), Left: 40(54)  Digits: Bilateral 2nd through 5th digit: MP Hyperextension with PIP, an DIP flexion     UPPER EXTREMITY MMT:     Left eval Left 03/21/2022 Left  05/25/2022 L 07/20/22 Left  08/15/2022 Left 09/26/2022 Left 11/16/2022 Left 12/28/2022 Left  03/13/2023  Shoulder flexion 3-/5 3/5 3+/5 4+/5 4+/5 4+/5 4+/5 4+/5 4+/5  Shoulder abduction 3-/5 3-/5 4-/5 4+/5 4+/5 4+5 4+/5 4+/5 4+/5  Shoulder adduction           Shoulder extension           Shoulder internal rotation           Shoulder external rotation           Elbow flexion 3/5 3+/5 4-/5 4+/5 5/5 5/5 5/5 5/5 5/5  Elbow extension 2-/5 2/5  4+/5 4+/5 4+/5  4/5 4/5  Wrist flexion           Wrist extension 2/5 2+/5 3-/5  3-/5 3-/5 3-/5 3/5 3/5  Wrist ulnar deviation           Wrist radial deviation           Wrist pronation           Wrist supination           (Blank rows = not tested)  Right eval Right 03/21/2022 Right  05/25/2022 R 07/20/22 Right 08/15/2022 Right 09/26/2022 Right 11/16/2022 Right 12/28/2022 Right 03/13/2023  Shoulder flexion 3-/5 scaption 3-/5 3-/5 3+/5 3+/5 3+/5 3/5 3/5 3/5  Shoulder abduction 3-/5 3-/5 3-/5 4/5 4/5 4/5 4/5 4/5 4/5  Shoulder adduction           Shoulder extension           Shoulder internal rotation           Shoulder external rotation           Elbow flexion 3/5 3/5 3+/5 4/5 4+/5 4+/5 4+/5 4+/5 4+/5  Elbow extension 2-/5 2/5 3-/5 4+/5 4+/5 4+/5 4+/5 4-/5 4-/5  Wrist flexion           Wrist extension 2-/5 2-/5 2/5 2/5 2/5 2/5 2/5 2/5 2/5  Wrist ulnar deviation           Wrist radial deviation           Wrist pronation           Wrist supination             HAND FUNCTION:  Grip strength: Right: 0 lbs; Left: 0 lbs and Lateral pinch: Right:  5 lbs, Left: 2 lbs  03/21/2022: Lateral pinch: Right: 3.5 lbs, Left: 2 lbs  07/20/22: Grip strength: Right: 3 lbs; Left: 4 lbs and Lateral pinch: Right: 5 lbs, Left: 3 lbs  08/15/2022:  Grip strength: Right: 0 lbs; Left: 5 lbs and Lateral pinch: Right: 2 lbs, Left: 4 lbs  09/26/2022  Grip strength: Right: 0 lbs; Left: 8 lbs and Lateral pinch: Right: 2 lbs, Left: 5 lbs  11/16/2022  Grip strength: Right: 0 lbs; Left: 8 lbs  9/25/20204  Grip strength: Right: 0 lbs; Left: 8 lbs  Grip strength: Right: 0 lbs; Left: 8 lbs   03/08/23:  Grip strength: Right: 0 lbs without PROM, 5 lbs after PROM, Left 8 lbs  Bilateral digit PIP/DIP flexion contractures with MP hyperextension with attempts for AROM. Pt. is  able to tolerate AROM to the bilateral digits at the initial evaluation however, has a history of pain in the digits.  COORDINATION: Eval: Pt. is unable to grasp 9-hole test pegs. Pt. is able to initiate grasping larger pegs, and is able to hold a pen in the left hand.  07/20/22: 2 min 36 seconds to remove 9 pegs from 9 hole peg test - cues to locate pegs 2/2 low vision. Pt. is able to initiate grasping larger pegs on R hand and is able to hold a pen in the left hand.  08/15/2022:    Vision, and sensation limiting accuracy of 9 hole peg test results. Pt. Was able to grasp and remove vertical pegs intermittently with cues.    SENSATION: Light touch: Impaired   EDEMA:  N/A  MUSCLE TONE: BUE flexor Spasticity  COGNITION Overall cognitive status: Continue to assess in functional context  VISION:   Subjective report: Pt. was not wearing glasses at the time of the initial eval.  Baseline vision: Vision is very limited. Wears glasses all the time Visual history: History of impaired vision following CVA. Pt. Has received treatment through the [facility] low vision rehabilitation program.   VISION ASSESSMENT: Impaired To be further assessed in functional context  PERCEPTION: Impaired    PRAXIS: Impaired: motor planning  OBSERVATIONS:  Pt reports being on Tramadol   TODAY'S TREATMENT   Therapeutic Ex.:   Pt. performed AROM for right shoulder flexion, abduction, and right sided pectoral stretching. Emphasis was placed on slow prolonged gentle passive stretching for RUE, forearm supination, bilateral wrist extension, wrist extension, digit MP, PIP, and DIP extension following moist heat. ROM was performed to decrease stiffness, and prepare the RUE for functional use. ROM was performed following moist heat to the shoulder right elbow, forearm, wrist, and digits. PROM for bilateral digit MP, PIP, and DIP extension in preparation for placing them onto a flat surface.  Neuromuscular re-education:  Pt. worked on repetition with grasping 1 " cubes with the right hand from a stable surface, as well as grasping them from an unstable surface. Pt. worked on moving them while extending the right elbow, and place them over the wall in front of him.      PATIENT EDUCATION: Education details: progress towards goals Person educated: Patient and Parent Education method: Explanation Education comprehension: verbalized understanding  HOME EXERCISE PROGRAM:  Continue ongoing assessment, and continue to provide as needed.   GOALS: Goals reviewed with patient? Yes  SHORT TERM GOALS: Target date: 11/07/2022   To assess splint fit, and make appropriate adjustments to promote good skin integrity through the palmar surface of the bilateral hands.  Baseline: 05/25/22: Goal currently met, however ongoing as needs to assess splint fit arise. 03/23/2022: Pt. is wearing splints a couple of hours at night bilateral resting hand splints. 03/21/2022: Pt. is wearing splints a couple of hours at night bilateral resting hand splints. Goal status: Deferred   LONG TERM GOALS: Target date: 05/08/2023    FOTO score will Improve by 2 points for Pt. perceived improvement with the assessment specific  ADL/IADL tasks.  Baseline:  03/08/23: 51; 12/28/2022: FOTO score: 56; 11/16/2022: 52 09/27/2022: 51 07/20/2022: FOTO 55 05/25/2022: FOTO score: 50,  TR score: 45 Eval: FOTO score: 40,  TR score: 45 Goal status: Achieved   2.   Pt. will independently perform oral care for 100% of the task after complete set-up. Baseline: 03/08/23: not as much assist required proximally, but to adjust positioning of toothbrush; 02/13/23:  Pt. Continues to require assist proximally 01/09/2023: Continue 12/28/2022:  Pt. Continues to complete 90% of the task with complete set-up, with visual cues, and occassional assist of the right elbow. 11/16/2022: Pt. Completes 90% of the task with complete set-up. 09/26/2022: Completes 90% of the task, proximal assist at times required at the elbow.  08/15/2022: Completes 90% of the task, proximal assist at times required at the elbow. 07/20/22: completes 90% of task, limited by shoulder flexion. 05/25/2022:  Pt. Is able to initiate and perform oral care for approximately 90% of the task. Complete set-up required. Assi needed only for the very back teeth. 03/23/2022: Pt. Is able to initiate and perform oral care for approximately 75% of the task. Pt. Requires assist at proximally at the elbow for through oral care. 03/21/2022: Pt. Is able to initiate and perform oral care for approximately 75% of the task. Pt. Requires assist at proximally at the elbow for through oral care. Eval: Pt. is able to initiate using an electric toothbrush. Pt. requires assist for set-up, and assist for thoroughness, and as he Pt. fatigues. Goal status: Ongoing  3.  Pt. Will be modified independence with self-feeding for 100% using a swivel spoon, and standard fork after complete set-up Baseline: 03/08/23: indep with cup with a handle and lid, assist to reposition eating utensils in hand;  02/13/23:  Pt. Is now able to consistently, and efficiently use a swivel spoon for eating cereal with the left hand.01/09/2023: Continue  12/28/2022: Pt. Is now feeding himself 90% of the time using a swivel spoon with the left hand, and 95% of the time with the fork. Pt. Continues to have difficulty with cutting food. 11/16/2022: 100% with finger foods, Pt. Is initiating with a swivel spoon, and universal cuff for fork use, however requires assist for consistency, and accuracy.  09/26/2022: 90% using the spoon, assist at time with the forearm motion, and grip on the utensils. 100% for finger foods.  08/15/2022:  90% using the spoon, assit at time with the forearm motion, and grip on the utensils. 100% for finger foods-independent with set-up- unsupervised.  07/20/22: self-feeds cereal using spoon 90% of task. 05/25/2022: Pt. Is able to use a spoon to scoop cereal when feeding himself cereal 85% of the time. Pt. Is able to feed himself snack/finger foods 100% of the time. Pt. Continues to work on consistency of  stabilizing a cup/mug when drinking. Pt. Is able to grasp a water bottle with assist initially, with assist tapering off as he drinks.03/23/2022: Pt. Is able to perform scooping cereal for 75% of the time. Pt. required assist, and support at the left elbow, and Pt. Presents with limited forearm supination when using the spoon, and bringing it towards his mouth. Pt. Is able to use a fork to spear items, and perform the hand to mouth pattern.  03/21/2022: Pt. Is able to perform scooping cereal for 75% of the time. Pt. required assist, and support at the left elbow, and Pt. presents with limited forearm supination when using the spoon, and bringing it towards his mouth. Pt. Is able to use a fork to spear items, and perform the hand to mouth pattern.  Eval: Pt. is able to hold standard standard utensils. Pt. Performs as much of the task as he, can and has assistance for the remainder. Goal status:   Ongoing  4.  Pt. Will improve grasp patterns and consistently grasp 1/4" objects for ADL, and IADL tasks.  Baseline: 03/08/23: 1/2" objects efficiently;  02/13/2023:  1/2" objects efficiently 01/09/2023: Continue 12/28/2022: Continues to grasp 1/4" pegs with min a + visual cues, consistently grasping 1/2" objects with visual cues 11/16/2022: Continues to grasp 1/4" pegs with min a + visual cues, consistently grasping 1/2" objects with visual cues 09/26/2022: Continues to grasp 1/4" pegs with min a + visual cues, consistently grasping 1/2" objects with visual cues. 08/15/2022: grasps 1/4" pegs with min a + visual cues, consistently grasping 1/2" objects with visual cues. 07/20/22: grasps 1/4" pegs with min a + visual cues, consistently grasping 1/2" objects with visual cues. 05/25/2022: Pt. Is working on improving consistency of grasping 1/2" objects with visual cues.  03/23/2022: Pt. Is able to grasp 1" objects consistently,and continues to work on the hand patterns needed to grasp 1/2" objects.03/21/2022: Pt. Is able to grasp 1" objects consistently,and continues to work on the hand patterns needed to grasp 1/2" objects. Eval: Pt. is able to grasp 1" objects intermittently using a lateral grasp pattern. Goal status: Ongoing  5.  Pt. will independently write his name legibly with letter sizes under 1". Baseline: 03/08/23: Not addressed this period; 02/13/2023: Continue 01/09/2023: Continue. 12/28/2022: Pt. is now using an IPad Pen. 11/16/2022: Pt. Continues to be able to write name with smaller letter size. Pt. Is signing name with a computer styus. Pt. Requires visual cues, and assist 09/26/2022: Pt. Continues to be able to write name with smaller letter size. Pt. Is signing name with a computer styus. Pt. Requires visual cues, and assist. 08/15/2022: Pt. Is able to write name with smaller letter size. Pt. Is signing name with a computer styus. Pt. Requires visual cues, and assist. 07/20/22: stabilizing assist to write name with 75% legibility with 2" letters. 05/25/2022: Pt. to continue to work towards formulating grasp patterns in preparation for grasping a large width pen.  Pt. Requires visual cues. 03/23/2022: Pt. Is able to write his name with modA, however has difficulty with formulating letter sizes less than 2" in size with 50% legibility for the 3 letters of his name.03/21/2022: Pt. Is able to write his name with modA, however has difficulty with formulating letter sizes less than 2" in size with 50% legibility for the 3 letters of his name. Eval: Pt is able to hold a thin marker with his left hand, and formulate a line, and initiate a circular pattern (Pt. without glasses today) Goal status: Ongoing  6. Pt. Will reach up to comb/brush his hair with minA.  Baseline: 03/08/23: Pt uses pick in L hand to reach 75% of his head; unable to reach R side of head; 02/13/2023:Pt. is able to use a hair pick with the left hand on the right side of his head, top, and back of the head. Pt. Continues to have difficulty reaching further to the right side of his head with the left. 01/09/2023: Continue 12/28/2022: Pt. Is progressing, and is now able to use a hair pick with the left hand on the right side of his head, top, and back of the head. Pt. Continues to have difficulty reaching further to the right side of his head with the left. 11/16/2022: Pt. Is able to use a hair pick for the left side of his head using his left hand. Pt. Presents with difficulty reaching to the right side of his head.09/26/2022: Pt. Is able to use a hair pick for the left side of his head using his left hand. Pt. Presents with difficulty reaching to the right side of his head. 5/13/202: 75% using a  hair pick, assist to the right side of the head. 07/20/22: reaches 75% of head, assist for far R side of head, fatigues quickly. 05/25/2022: Pt. Is able to reach up with the left hand to the left side, top, and back of his head. 03/23/2022: Pt. is now able to more consistently initiate reaching up to his head with his left hand in preparation for haircare12/18/2023: Pt. is now able to more consistently initiate reaching up to  his head with his left hand in preparation for haircare. Eval: Pt. is able to initiate reaching up for hair care with a long handled brush, however is unable to sustain UE's in elevation to perform the task.     Goal status: Ongoing  7. Pt. Will independently navigate the w/c through his environment with minA with visual scanning, and hand placement on the controls.  Baseline: 11/16/2022: Deferred 2/2 vision 09/26/2022: Pt. Is now navigating his w/c ouside the home, and around his block. 08/15/2022: Pt. Requires minA to setup hand on controls and MIN cues to navigate the w/c in wide spaces, requires MIN - MOD cues to navigate the w/c through more narrow doorways, and tighter turns - varies based on good vs bad vision days.07/20/22: Pt. Requires minA to setup hand on controls and MIN cues to navigate the w/c in wide spaces, requires MIN - MOD cues to navigate the w/c through more narrow doorways, and tighter turns - varies based on good vs bad vision days. 05/25/2022: Pt. Requires minA  and  cues to navigate the w/c in wide spaces, and requires Mod cues to navigate the w/c through more narrow doorways, and tighter turns. Pt. Requires max cues for scanning through the environment, and moderate cues for hand placement on the controls.     Goal status: Deferred 2/2 vision    8. Pt. Will improve bilateral grip strength to be able to independently grasp, and pull up blankets, and linens.   Baseline: 03/08/23: R grip 0-5 lbs, L grip 8 lbs; pt reports he can consistently pull up blankets and linens independently.  02/13/2023: Continue. Pt. presents with digit PIP, and DIP flexor tightness. 01/09/2023: Continue 12/28/2023: R: 0#, L: 5# Pt. Is now able to more consistently grasp and pull blankets up over him. 11/16/2022: R: 0 L: 8# Pt. Is more consistently grasping his blankets and attempting to pull them up. 09/26/2022: R: 0  L: 8# Pt. is attempting to grasp, and pull blankets up more, using mostly the left hand. 08/15/2022:  Pt. has difficulty securely holding, and pulling up blankets, and linens.    Goal status: Ongoing  9. Pt. will consistently actively control the releasing of blankets, covers, and linens from his hands once they are in the desired position over him. Baseline:12/28/2022: Pt. Is consistently actively able to release linens once they are in the desired position over him. 11/16/2022: Pt. continues to improve with actively releasing blankets form his hands. 09/26/2022: Pt. is improving with actively releasing blankets form his hands. 08/15/2022: Pt. has difficulty with controlled releasing of blankets/linens from his grasp.     Goal status: Achieved    10. Pt. Will improve bilateral UE strength by 2 MM grades to assist with ADLs, and IADLs  Baseline: 03/13/2023: Right: shoulder flexion: 3/5, abduction: 4/5, elbow flexion: 4+/5 , extension: 4-/5 wrist extension: 2/5; Left: shoulder flexion: 4+/5, abduction: 4+/5, elbow flexion: 5/5 , extension: 4/5  wrist extension: 3/5 03/08/23: NT this date d/t as pt arrived late; 02/13/2023: Continue 01/09/2023: Continue 12/28/2022:  Right: shoulder flexion: 3/5, abduction: 4/5, elbow flexion: 4+/5 , extension: 4-/5 wrist extension: 2/5; Left: shoulder flexion: 4+/5, abduction: 4+/5, elbow flexion: 5/5 , extension: 4/5  wrist extension: 3/5 Right: shoulder flexion: 3+/5, abduction: 4/5, elbow flexion: 4+/5 , extension: 4+/5 wrist extension: 2/5; Left: shoulder flexion: 4+/5, abduction: 4+/5, elbow flexion: 5/5 , extension: 4+/5 wrist extension: 3-/5 11/16/2022: Right: shoulder flexion: 3/5, abduction: 4/5, elbow flexion: 4+/5 , extension: 4+/5 wrist extension: 2/5; Left: shoulder flexion: 4+/5, abduction: 4+/5, elbow flexion: 5/5 , extension:  wrist extension: 3-/5 Right: shoulder flexion: 3+/5, abduction: 4/5, elbow flexion: 4+/5 , extension: 4+/5 wrist extension: 2/5; Left: shoulder flexion: 4+/5, abduction: 4+/5, elbow flexion: 5/5 , extension: 4+/5 wrist extension: 3-/5 Goal  status: Ongoing  11. Pt. Will improve right wrist extension by 10 degrees to initiate reaching for items in preparation for ADLs. Baseline: 03/13/23: -22(35) 03/08/23: NT this date as pt arrived late; 02/13/2023: Continue 01/09/2023: Continue 12/28/2022: -22(34) 11/16/2022: -30(10)    Goal status: Progressing   ASSESSMENT:  CLINICAL IMPRESSION:  Pt. tolerated UE there. Ex. stretching with emphasis placed on right pectoral stretching 2/2 tightness. Pt. present with improved grasping 1" with the right hand, as well as  worked on improving elbow extension reps. Pt. continues to be highly motivated to continue working towards improving BUE functioning, ROM, strength, motor control, [facility] skills, as well as visual compensatory strategies in order to maximize independence with daily ADLs, and IADL functioning.      PERFORMANCE DEFICITS in functional skills including ADLs, IADLs, coordination, dexterity, proprioception, ROM, strength, pain, FMC, GMC, decreased knowledge of use of DME, and UE functional use, cognitive skills including safety awareness, and psychosocial skills including coping strategies, environmental adaptation, habits, and routines and behaviors.   IMPAIRMENTS are limiting patient from ADLs, IADLs, leisure, and social participation.   COMORBIDITIES may have co-morbidities  that affects occupational performance. Patient will benefit from skilled OT to address above impairments and improve overall function.  MODIFICATION OR ASSISTANCE TO COMPLETE EVALUATION: Maximum or significant modification of tasks or assist is necessary to complete an evaluation.  OT OCCUPATIONAL PROFILE AND HISTORY: Comprehensive assessment: Review of records and extensive additional review of physical, cognitive, psychosocial history related to current functional performance.  CLINICAL DECISION MAKING: High - multiple treatment options, significant modification of task necessary  REHAB POTENTIAL: Fair     EVALUATION COMPLEXITY: High    PLAN: OT FREQUENCY: 2 x's a week  OT DURATION:12 weeks  PLANNED INTERVENTIONS: self care/ADL training, therapeutic exercise, therapeutic activity, neuromuscular re-education, manual therapy, passive range of motion, patient/family education, and cognitive remediation/compensation RECOMMENDED OTHER SERVICES: PT  CONSULTED AND AGREED WITH PLAN OF CARE: Patient and family member/caregiver  PLAN FOR NEXT SESSION: Review HEP  Olegario Messier, MS, OTR/L  03/22/2023

## 2023-03-24 DIAGNOSIS — G4733 Obstructive sleep apnea (adult) (pediatric): Secondary | ICD-10-CM | POA: Diagnosis not present

## 2023-03-25 DIAGNOSIS — G4733 Obstructive sleep apnea (adult) (pediatric): Secondary | ICD-10-CM | POA: Diagnosis not present

## 2023-03-27 ENCOUNTER — Ambulatory Visit: Payer: BC Managed Care – PPO | Admitting: Occupational Therapy

## 2023-03-27 ENCOUNTER — Ambulatory Visit: Payer: BC Managed Care – PPO

## 2023-04-03 ENCOUNTER — Ambulatory Visit: Payer: BC Managed Care – PPO | Admitting: Occupational Therapy

## 2023-04-03 ENCOUNTER — Ambulatory Visit: Payer: BC Managed Care – PPO

## 2023-04-10 ENCOUNTER — Ambulatory Visit: Payer: Medicare Other | Admitting: Occupational Therapy

## 2023-04-10 ENCOUNTER — Ambulatory Visit: Payer: Medicare Other

## 2023-04-12 ENCOUNTER — Ambulatory Visit: Payer: BC Managed Care – PPO | Admitting: Occupational Therapy

## 2023-04-12 ENCOUNTER — Ambulatory Visit: Payer: BC Managed Care – PPO | Attending: Physician Assistant

## 2023-04-12 DIAGNOSIS — I6930 Unspecified sequelae of cerebral infarction: Secondary | ICD-10-CM | POA: Insufficient documentation

## 2023-04-12 DIAGNOSIS — R2681 Unsteadiness on feet: Secondary | ICD-10-CM | POA: Diagnosis not present

## 2023-04-12 DIAGNOSIS — R2689 Other abnormalities of gait and mobility: Secondary | ICD-10-CM | POA: Diagnosis not present

## 2023-04-12 DIAGNOSIS — H543 Unqualified visual loss, both eyes: Secondary | ICD-10-CM | POA: Insufficient documentation

## 2023-04-12 DIAGNOSIS — R278 Other lack of coordination: Secondary | ICD-10-CM | POA: Insufficient documentation

## 2023-04-12 DIAGNOSIS — R269 Unspecified abnormalities of gait and mobility: Secondary | ICD-10-CM | POA: Insufficient documentation

## 2023-04-12 DIAGNOSIS — R262 Difficulty in walking, not elsewhere classified: Secondary | ICD-10-CM | POA: Insufficient documentation

## 2023-04-12 DIAGNOSIS — M6281 Muscle weakness (generalized): Secondary | ICD-10-CM

## 2023-04-12 NOTE — Therapy (Signed)
 Occupational Therapy Neuro Treatment Note   Patient Name: Paul Hall MRN: [id number] DOB:05/16/1995, 28 y.o., male Today's Date: 04/12/2023  PCP: Dr. Bertell REFERRING PROVIDER: Dr. Bertell   OT End of Session - 04/12/23 1722     Visit Number 75    Number of Visits 132    Date for OT Re-Evaluation 05/08/23    OT Start Time 1615    OT Stop Time 1700    OT Time Calculation (min) 45 min    Activity Tolerance Patient tolerated treatment well    Behavior During Therapy WFL for tasks assessed/performed                Past Medical History:  Diagnosis Date   Diabetes mellitus (HCC)    Hypertension    Stroke [facility])    Past Surgical History:  Procedure Laterality Date   IVC FILTER PLACEMENT ([facility] HX)     LEG SURGERY     PEG TUBE PLACEMENT     TRACHEOSTOMY     Patient Active Problem List   Diagnosis Date Noted   Sepsis due to vancomycin  resistant Enterococcus species (HCC) 06/06/2019   SIRS (systemic inflammatory response syndrome) (HCC) 06/05/2019   Acute lower UTI 06/05/2019   VRE (vancomycin -resistant Enterococci) infection 06/05/2019   Anemia 06/05/2019   Skin ulcer of sacrum with necrosis of muscle (HCC)    Urinary retention    Type 2 diabetes mellitus without complication, with long-term current use of insulin  (HCC)    Tachycardia    Lower extremity edema    Acute metabolic encephalopathy    Obstructive sleep apnea    Morbid obesity with BMI of 60.0-69.9, adult (HCC)    Goals of care, counseling/discussion    Palliative care encounter    Sepsis (HCC) 04/27/2019   H/O insulin  dependent diabetes mellitus 04/27/2019   History of CVA with residual deficit 04/27/2019   Seizure disorder (HCC) 04/27/2019   Decubitus ulcer of sacral region, stage 4 (HCC) 04/27/2019   ONSET DATE: 01/2019  REFERRING DIAG: CVA/COVID-19  THERAPY DIAG:  Muscle weakness (generalized)  Rationale for Evaluation and Treatment Rehabilitation  SUBJECTIVE:   SUBJECTIVE  STATEMENT:  Pt  reports doing well today.   Pt accompanied by: self and family member  PERTINENT HISTORY:  Pt. is a 28 y.o. male who was diagnosed with COVID-19, and CVA with resultant quadriplegia. Pt. Was then hospitalized with VRE UTI. PMHx includes: urinary retention, seizure disorder, obstructive sleep disorder, DM Type II, Morbid obesity.   PRECAUTIONS: None  WEIGHT BEARING RESTRICTIONS No  PAIN:  03/08/23: 0 pain at rest, 5/10 when stretching in BUEs Are you having pain? Reports right  wrist tightness.  FALLS: Has patient fallen in last 6 months? No  LIVING ENVIRONMENT: Lives with: lives with their family Lives in: House/apartment Stairs: No Level Entry Has following equipment at home: Wheelchair (power) and hoyer lift, sit to stand lift  PLOF: Independent  PATIENT GOALS: To be able to engage in more daily care tasks.  OBJECTIVE:   HAND DOMINANCE: Right  ADLs:  Transfers/ambulation related to ADLs: Eating: Pt. reports being able to hold standard utensils, and is starting to engage more in self-feeding tasks, hand to mouth patterns. Pt. Reports that he does as much as he can with the task, and family assists  with the remainder of the task. Grooming: Pt. Is able to initiate holding an electric toothbrush, and brush his teeth. Family assists LB Dressing: Total Assist UE dressing: Pt. is now able to  reach up to actively assist with grasping , and pulling his gown down. Toileting:  Total Assist Bathing: MaxA UB, Total assist LB Tub Shower transfers: N/A Equipment: See above    IADLs: Shopping: Relies on family to assist Light housekeeping: Total Assist Meal Prep: Total Assist Community mobility:   Medication management:  Total Assist  Financial management: N/A Handwriting: Not legible: Pt. Is able to hold a pen with the left hand, and initiate marking the page. Pt.'s eye glasses were not available  MOBILITY STATUS:  Power w/c  POSTURE COMMENTS:  Pt. Requires  position changes in his power w/c  ACTIVITY TOLERANCE: Activity tolerance:  Fair  FUNCTIONAL OUTCOME MEASURES: FOTO: 40  TR score: 45  05/25/2022:   FOTO: 50 TR score: 45  07/20/22: FOTO 55  03/08/23: 51  UPPER EXTREMITY ROM     Active ROM Left eval Left  03/21/2022 Left 05/25/2022 L  07/20/22 Left 07/25/2022 Left  08/15/2022 Left  09/26/2022 Left  11/16/2022 Left 12/28/2022 Left 03/13/23  Shoulder flexion 119 123 125 130  136 138 130 142 144  Shoulder abduction 110 115 115 115  118 121 125 142 140  Shoulder adduction            Shoulder extension            Shoulder internal rotation            Shoulder external rotation            Elbow flexion 135(135) 145 145 145  145    145  Elbow extension -27(-18) -21(-20) -20(-16)  -25(-10) -23(-10) -21(-10) -20(-18) -20(-18) -20(-17)  Wrist flexion            Wrist extension 10(50) 19(50) 28(50)  30(50) 35(55) 36(55) 36(60) 40(60) 45(60)  Wrist ulnar deviation            Wrist radial deviation            Wrist pronation            Wrist supination   Limited by flexor tone       10(15)  (Blank rows = not tested)    Active ROM Right eval Right 03/21/2022 Right 05/25/2022 R  07/20/22 Right  07/25/22 Right 08/15/2022 Right 09/26/2022 Right 11/16/2022 Right 12/28/2022 Right 03/13/23  Shoulder flexion 106 scaption 105  Scaption 62 flexion 110 scaption 100  105 105 105 112 Scaption 123 Scaption  Shoulder abduction 114 90 97 100  105 117 120 124 124  Shoulder adduction            Shoulder extension            Shoulder internal rotation            Shoulder external rotation            Elbow flexion 120(130) 145 145 140  144 140 142 148 148  Elbow extension -45(-35) -22(-35) -28(-22)  -32(-21) -32(-28) -28(-26) -28(-28) -27(-26) -27(-40)  Wrist flexion            Wrist extension -30(10) -25(10) -10(30)  -10(20) -10(25) -20(40) -30(10) -22(34) -22(35)  Wrist ulnar deviation            Wrist radial deviation            Wrist  pronation            Wrist supination   Limited by flexor tone           12/28/2022: Thumb radial abduction:  Right: 12(46), Left: 40(54)  Digits: Bilateral 2nd through 5th digit: MP Hyperextension with PIP, an DIP flexion     UPPER EXTREMITY MMT:     Left eval Left 03/21/2022 Left  05/25/2022 L 07/20/22 Left  08/15/2022 Left 09/26/2022 Left 11/16/2022 Left 12/28/2022 Left  03/13/2023  Shoulder flexion 3-/5 3/5 3+/5 4+/5 4+/5 4+/5 4+/5 4+/5 4+/5  Shoulder abduction 3-/5 3-/5 4-/5 4+/5 4+/5 4+5 4+/5 4+/5 4+/5  Shoulder adduction           Shoulder extension           Shoulder internal rotation           Shoulder external rotation           Elbow flexion 3/5 3+/5 4-/5 4+/5 5/5 5/5 5/5 5/5 5/5  Elbow extension 2-/5 2/5  4+/5 4+/5 4+/5  4/5 4/5  Wrist flexion           Wrist extension 2/5 2+/5 3-/5  3-/5 3-/5 3-/5 3/5 3/5  Wrist ulnar deviation           Wrist radial deviation           Wrist pronation           Wrist supination           (Blank rows = not tested)  Right eval Right 03/21/2022 Right  05/25/2022 R 07/20/22 Right 08/15/2022 Right 09/26/2022 Right 11/16/2022 Right 12/28/2022 Right 03/13/2023  Shoulder flexion 3-/5 scaption 3-/5 3-/5 3+/5 3+/5 3+/5 3/5 3/5 3/5  Shoulder abduction 3-/5 3-/5 3-/5 4/5 4/5 4/5 4/5 4/5 4/5  Shoulder adduction           Shoulder extension           Shoulder internal rotation           Shoulder external rotation           Elbow flexion 3/5 3/5 3+/5 4/5 4+/5 4+/5 4+/5 4+/5 4+/5  Elbow extension 2-/5 2/5 3-/5 4+/5 4+/5 4+/5 4+/5 4-/5 4-/5  Wrist flexion           Wrist extension 2-/5 2-/5 2/5 2/5 2/5 2/5 2/5 2/5 2/5  Wrist ulnar deviation           Wrist radial deviation           Wrist pronation           Wrist supination             HAND FUNCTION:  Grip strength: Right: 0 lbs; Left: 0 lbs and Lateral pinch: Right: 5 lbs, Left: 2 lbs  03/21/2022: Lateral pinch: Right: 3.5 lbs, Left: 2 lbs  07/20/22: Grip strength: Right: 3 lbs;  Left: 4 lbs and Lateral pinch: Right: 5 lbs, Left: 3 lbs  08/15/2022:  Grip strength: Right: 0 lbs; Left: 5 lbs and Lateral pinch: Right: 2 lbs, Left: 4 lbs  09/26/2022  Grip strength: Right: 0 lbs; Left: 8 lbs and Lateral pinch: Right: 2 lbs, Left: 5 lbs  11/16/2022  Grip strength: Right: 0 lbs; Left: 8 lbs  9/25/20204  Grip strength: Right: 0 lbs; Left: 8 lbs  Grip strength: Right: 0 lbs; Left: 8 lbs   03/08/23:  Grip strength: Right: 0 lbs without PROM, 5 lbs after PROM, Left 8 lbs  Bilateral digit PIP/DIP flexion contractures with MP hyperextension with attempts for AROM. Pt. is able to tolerate AROM to the bilateral digits at the initial evaluation however, has a history of pain in the digits.  COORDINATION: Eval: Pt. is unable to grasp 9-hole test pegs. Pt. is able to initiate grasping larger pegs, and is able to hold a pen in the left hand.  07/20/22: 2 min 36 seconds to remove 9 pegs from 9 hole peg test - cues to locate pegs 2/2 low vision. Pt. is able to initiate grasping larger pegs on R hand and is able to hold a pen in the left hand.  08/15/2022:    Vision, and sensation limiting accuracy of 9 hole peg test results. Pt. Was able to grasp and remove vertical pegs intermittently with cues.    SENSATION: Light touch: Impaired   EDEMA:  N/A  MUSCLE TONE: BUE flexor Spasticity  COGNITION Overall cognitive status: Continue to assess in functional context  VISION:   Subjective report: Pt. was not wearing glasses at the time of the initial eval.  Baseline vision: Vision is very limited. Wears glasses all the time Visual history: History of impaired vision following CVA. Pt. Has received treatment through the [facility] low vision rehabilitation program.   VISION ASSESSMENT: Impaired To be further assessed in functional context  PERCEPTION: Impaired   PRAXIS: Impaired: motor planning  OBSERVATIONS:  Pt reports being on Tramadol   TODAY'S TREATMENT   Therapeutic  Ex.:   Pt. performed AROM for right shoulder flexion, abduction, and right sided pectoral stretching. Emphasis was placed on slow prolonged gentle passive stretching for RUE, forearm supination, bilateral wrist extension, wrist extension, digit MP, PIP, and DIP extension following moist heat. ROM was performed to decrease stiffness, and prepare the RUE for functional use. ROM was performed following moist heat to the shoulder right elbow, forearm, wrist, and digits. PROM for bilateral digit MP, PIP, and DIP extension in preparation for placing them onto a flat surface.  Manual therapy:  Pt. tolerated soft tissue massage to the the volar surface of the 2nd through 5th digits, the lateral, and sagittal bands in the digits, as well as the wrist and digit flexors at the forearm 2/2 flexor tightness. Manual therapy was performed independent of, and in preparation for therapeutic Ex.         PATIENT EDUCATION: Education details: stretching, PROM, massage to the digits on the bilateral hands. Person educated: Patient and Parent Education method: Explanation Education comprehension: verbalized understanding  HOME EXERCISE PROGRAM:  Continue ongoing assessment, and continue to provide as needed.   GOALS: Goals reviewed with patient? Yes  SHORT TERM GOALS: Target date: 11/07/2022   To assess splint fit, and make appropriate adjustments to promote good skin integrity through the palmar surface of the bilateral hands.  Baseline: 05/25/22: Goal currently met, however ongoing as needs to assess splint fit arise. 03/23/2022: Pt. is wearing splints a couple of hours at night bilateral resting hand splints. 03/21/2022: Pt. is wearing splints a couple of hours at night bilateral resting hand splints. Goal status: Deferred   LONG TERM GOALS: Target date: 05/08/2023    FOTO score will Improve by 2 points for Pt. perceived improvement with the assessment specific ADL/IADL tasks.  Baseline:  03/08/23: 51;  12/28/2022: FOTO score: 56; 11/16/2022: 52 09/27/2022: 51 07/20/2022: FOTO 55 05/25/2022: FOTO score: 50,  TR score: 45 Eval: FOTO score: 40,  TR score: 45 Goal status: Achieved   2.   Pt. will independently perform oral care for 100% of the task after complete set-up. Baseline: 03/08/23: not as much assist required proximally, but to adjust positioning of toothbrush; 02/13/23: Pt. Continues to require assist proximally 01/09/2023: Continue  12/28/2022:  Pt. Continues to complete 90% of the task with complete set-up, with visual cues, and occassional assist of the right elbow. 11/16/2022: Pt. Completes 90% of the task with complete set-up. 09/26/2022: Completes 90% of the task, proximal assist at times required at the elbow.  08/15/2022: Completes 90% of the task, proximal assist at times required at the elbow. 07/20/22: completes 90% of task, limited by shoulder flexion. 05/25/2022:  Pt. Is able to initiate and perform oral care for approximately 90% of the task. Complete set-up required. Assi needed only for the very back teeth. 03/23/2022: Pt. Is able to initiate and perform oral care for approximately 75% of the task. Pt. Requires assist at proximally at the elbow for through oral care. 03/21/2022: Pt. Is able to initiate and perform oral care for approximately 75% of the task. Pt. Requires assist at proximally at the elbow for through oral care. Eval: Pt. is able to initiate using an electric toothbrush. Pt. requires assist for set-up, and assist for thoroughness, and as he Pt. fatigues. Goal status: Ongoing  3.  Pt. Will be modified independence with self-feeding for 100% using a swivel spoon, and standard fork after complete set-up Baseline: 03/08/23: indep with cup with a handle and lid, assist to reposition eating utensils in hand;  02/13/23:  Pt. Is now able to consistently, and efficiently use a swivel spoon for eating cereal with the left hand.01/09/2023: Continue 12/28/2022: Pt. Is now feeding himself 90% of  the time using a swivel spoon with the left hand, and 95% of the time with the fork. Pt. Continues to have difficulty with cutting food. 11/16/2022: 100% with finger foods, Pt. Is initiating with a swivel spoon, and universal cuff for fork use, however requires assist for consistency, and accuracy.  09/26/2022: 90% using the spoon, assist at time with the forearm motion, and grip on the utensils. 100% for finger foods.  08/15/2022:  90% using the spoon, assit at time with the forearm motion, and grip on the utensils. 100% for finger foods-independent with set-up- unsupervised.  07/20/22: self-feeds cereal using spoon 90% of task. 05/25/2022: Pt. Is able to use a spoon to scoop cereal when feeding himself cereal 85% of the time. Pt. Is able to feed himself snack/finger foods 100% of the time. Pt. Continues to work on consistency of  stabilizing a cup/mug when drinking. Pt. Is able to grasp a water  bottle with assist initially, with assist tapering off as he drinks.03/23/2022: Pt. Is able to perform scooping cereal for 75% of the time. Pt. required assist, and support at the left elbow, and Pt. Presents with limited forearm supination when using the spoon, and bringing it towards his mouth. Pt. Is able to use a fork to spear items, and perform the hand to mouth pattern.  03/21/2022: Pt. Is able to perform scooping cereal for 75% of the time. Pt. required assist, and support at the left elbow, and Pt. presents with limited forearm supination when using the spoon, and bringing it towards his mouth. Pt. Is able to use a fork to spear items, and perform the hand to mouth pattern.  Eval: Pt. is able to hold standard standard utensils. Pt. Performs as much of the task as he, can and has assistance for the remainder. Goal status:   Ongoing  4.  Pt. Will improve grasp patterns and consistently grasp 1/4 objects for ADL, and IADL tasks.  Baseline: 03/08/23: 1/2 objects efficiently; 02/13/2023: 1/2 objects efficiently  01/09/2023: Continue 12/28/2022: Continues to  grasp 1/4 pegs with min a + visual cues, consistently grasping 1/2 objects with visual cues 11/16/2022: Continues to grasp 1/4 pegs with min a + visual cues, consistently grasping 1/2 objects with visual cues 09/26/2022: Continues to grasp 1/4 pegs with min a + visual cues, consistently grasping 1/2 objects with visual cues. 08/15/2022: grasps 1/4 pegs with min a + visual cues, consistently grasping 1/2 objects with visual cues. 07/20/22: grasps 1/4 pegs with min a + visual cues, consistently grasping 1/2 objects with visual cues. 05/25/2022: Pt. Is working on improving consistency of grasping 1/2 objects with visual cues.  03/23/2022: Pt. Is able to grasp 1 objects consistently,and continues to work on the hand patterns needed to grasp 1/2 objects.03/21/2022: Pt. Is able to grasp 1 objects consistently,and continues to work on the hand patterns needed to grasp 1/2 objects. Eval: Pt. is able to grasp 1 objects intermittently using a lateral grasp pattern. Goal status: Ongoing  5.  Pt. will independently write his name legibly with letter sizes under 1. Baseline: 03/08/23: Not addressed this period; 02/13/2023: Continue 01/09/2023: Continue. 12/28/2022: Pt. is now using an IPad Pen. 11/16/2022: Pt. Continues to be able to write name with smaller letter size. Pt. Is signing name with a computer styus. Pt. Requires visual cues, and assist 09/26/2022: Pt. Continues to be able to write name with smaller letter size. Pt. Is signing name with a computer styus. Pt. Requires visual cues, and assist. 08/15/2022: Pt. Is able to write name with smaller letter size. Pt. Is signing name with a computer styus. Pt. Requires visual cues, and assist. 07/20/22: stabilizing assist to write name with 75% legibility with 2 letters. 05/25/2022: Pt. to continue to work towards formulating grasp patterns in preparation for grasping a large width pen. Pt. Requires visual cues.  03/23/2022: Pt. Is able to write his name with modA, however has difficulty with formulating letter sizes less than 2 in size with 50% legibility for the 3 letters of his name.03/21/2022: Pt. Is able to write his name with modA, however has difficulty with formulating letter sizes less than 2 in size with 50% legibility for the 3 letters of his name. Eval: Pt is able to hold a thin marker with his left hand, and formulate a line, and initiate a circular pattern (Pt. without glasses today) Goal status: Ongoing  6. Pt. Will reach up to comb/brush his hair with minA.  Baseline: 03/08/23: Pt uses pick in L hand to reach 75% of his head; unable to reach R side of head; 02/13/2023:Pt. is able to use a hair pick with the left hand on the right side of his head, top, and back of the head. Pt. Continues to have difficulty reaching further to the right side of his head with the left. 01/09/2023: Continue 12/28/2022: Pt. Is progressing, and is now able to use a hair pick with the left hand on the right side of his head, top, and back of the head. Pt. Continues to have difficulty reaching further to the right side of his head with the left. 11/16/2022: Pt. Is able to use a hair pick for the left side of his head using his left hand. Pt. Presents with difficulty reaching to the right side of his head.09/26/2022: Pt. Is able to use a hair pick for the left side of his head using his left hand. Pt. Presents with difficulty reaching to the right side of his head. 5/13/202: 75% using a hair pick, assist to the right side of  the head. 07/20/22: reaches 75% of head, assist for far R side of head, fatigues quickly. 05/25/2022: Pt. Is able to reach up with the left hand to the left side, top, and back of his head. 03/23/2022: Pt. is now able to more consistently initiate reaching up to his head with his left hand in preparation for haircare12/18/2023: Pt. is now able to more consistently initiate reaching up to his head with his left  hand in preparation for haircare. Eval: Pt. is able to initiate reaching up for hair care with a long handled brush, however is unable to sustain UE's in elevation to perform the task.     Goal status: Ongoing  7. Pt. Will independently navigate the w/c through his environment with minA with visual scanning, and hand placement on the controls.  Baseline: 11/16/2022: Deferred 2/2 vision 09/26/2022: Pt. Is now navigating his w/c ouside the home, and around his block. 08/15/2022: Pt. Requires minA to setup hand on controls and MIN cues to navigate the w/c in wide spaces, requires MIN - MOD cues to navigate the w/c through more narrow doorways, and tighter turns - varies based on good vs bad vision days.07/20/22: Pt. Requires minA to setup hand on controls and MIN cues to navigate the w/c in wide spaces, requires MIN - MOD cues to navigate the w/c through more narrow doorways, and tighter turns - varies based on good vs bad vision days. 05/25/2022: Pt. Requires minA  and  cues to navigate the w/c in wide spaces, and requires Mod cues to navigate the w/c through more narrow doorways, and tighter turns. Pt. Requires max cues for scanning through the environment, and moderate cues for hand placement on the controls.     Goal status: Deferred 2/2 vision    8. Pt. Will improve bilateral grip strength to be able to independently grasp, and pull up blankets, and linens.   Baseline: 03/08/23: R grip 0-5 lbs, L grip 8 lbs; pt reports he can consistently pull up blankets and linens independently.  02/13/2023: Continue. Pt. presents with digit PIP, and DIP flexor tightness. 01/09/2023: Continue 12/28/2023: R: 0#, L: 5# Pt. Is now able to more consistently grasp and pull blankets up over him. 11/16/2022: R: 0 L: 8# Pt. Is more consistently grasping his blankets and attempting to pull them up. 09/26/2022: R: 0  L: 8# Pt. is attempting to grasp, and pull blankets up more, using mostly the left hand. 08/15/2022: Pt. has difficulty  securely holding, and pulling up blankets, and linens.    Goal status: Ongoing  9. Pt. will consistently actively control the releasing of blankets, covers, and linens from his hands once they are in the desired position over him. Baseline:12/28/2022: Pt. Is consistently actively able to release linens once they are in the desired position over him. 11/16/2022: Pt. continues to improve with actively releasing blankets form his hands. 09/26/2022: Pt. is improving with actively releasing blankets form his hands. 08/15/2022: Pt. has difficulty with controlled releasing of blankets/linens from his grasp.     Goal status: Achieved    10. Pt. Will improve bilateral UE strength by 2 MM grades to assist with ADLs, and IADLs  Baseline: 03/13/2023: Right: shoulder flexion: 3/5, abduction: 4/5, elbow flexion: 4+/5 , extension: 4-/5 wrist extension: 2/5; Left: shoulder flexion: 4+/5, abduction: 4+/5, elbow flexion: 5/5 , extension: 4/5  wrist extension: 3/5 03/08/23: NT this date d/t as pt arrived late; 02/13/2023: Continue 01/09/2023: Continue 12/28/2022: Right: shoulder flexion: 3/5, abduction: 4/5, elbow flexion:  4+/5 , extension: 4-/5 wrist extension: 2/5; Left: shoulder flexion: 4+/5, abduction: 4+/5, elbow flexion: 5/5 , extension: 4/5  wrist extension: 3/5 Right: shoulder flexion: 3+/5, abduction: 4/5, elbow flexion: 4+/5 , extension: 4+/5 wrist extension: 2/5; Left: shoulder flexion: 4+/5, abduction: 4+/5, elbow flexion: 5/5 , extension: 4+/5 wrist extension: 3-/5 11/16/2022: Right: shoulder flexion: 3/5, abduction: 4/5, elbow flexion: 4+/5 , extension: 4+/5 wrist extension: 2/5; Left: shoulder flexion: 4+/5, abduction: 4+/5, elbow flexion: 5/5 , extension:  wrist extension: 3-/5 Right: shoulder flexion: 3+/5, abduction: 4/5, elbow flexion: 4+/5 , extension: 4+/5 wrist extension: 2/5; Left: shoulder flexion: 4+/5, abduction: 4+/5, elbow flexion: 5/5 , extension: 4+/5 wrist extension: 3-/5 Goal status: Ongoing  11.  Pt. Will improve right wrist extension by 10 degrees to initiate reaching for items in preparation for ADLs. Baseline: 03/13/23: -22(35) 03/08/23: NT this date as pt arrived late; 02/13/2023: Continue 01/09/2023: Continue 12/28/2022: -22(34) 11/16/2022: -30(10)    Goal status: Progressing   ASSESSMENT:  CLINICAL IMPRESSION:  Pt. Reports feeling off today. Pt. Presents with increased flexor tone, and tightness today, however responded very well to the manual therapy, and Passive stretching. Pt. Has a MD appointment tomorrow with his primary care physician, and has an appointment for Botox at this month. Pt. continues to be highly motivated to continue working towards improving BUE functioning, ROM, strength, motor control, [facility] skills, as well as visual compensatory strategies in order to maximize independence with daily ADLs, and IADL functioning.      PERFORMANCE DEFICITS in functional skills including ADLs, IADLs, coordination, dexterity, proprioception, ROM, strength, pain, FMC, GMC, decreased knowledge of use of DME, and UE functional use, cognitive skills including safety awareness, and psychosocial skills including coping strategies, environmental adaptation, habits, and routines and behaviors.   IMPAIRMENTS are limiting patient from ADLs, IADLs, leisure, and social participation.   COMORBIDITIES may have co-morbidities  that affects occupational performance. Patient will benefit from skilled OT to address above impairments and improve overall function.  MODIFICATION OR ASSISTANCE TO COMPLETE EVALUATION: Maximum or significant modification of tasks or assist is necessary to complete an evaluation.  OT OCCUPATIONAL PROFILE AND HISTORY: Comprehensive assessment: Review of records and extensive additional review of physical, cognitive, psychosocial history related to current functional performance.  CLINICAL DECISION MAKING: High - multiple treatment options, significant modification of task  necessary  REHAB POTENTIAL: Fair    EVALUATION COMPLEXITY: High    PLAN: OT FREQUENCY: 2 x's a week  OT DURATION:12 weeks  PLANNED INTERVENTIONS: self care/ADL training, therapeutic exercise, therapeutic activity, neuromuscular re-education, manual therapy, passive range of motion, patient/family education, and cognitive remediation/compensation RECOMMENDED OTHER SERVICES: PT  CONSULTED AND AGREED WITH PLAN OF CARE: Patient and family member/caregiver  PLAN FOR NEXT SESSION: Review HEP  Richardson Otter, MS, OTR/L  04/12/2023

## 2023-04-12 NOTE — Therapy (Signed)
 OUTPATIENT PHYSICAL THERAPY TREATMENT      Patient Name: Paul Hall MRN: [id number] DOB:January 25, 1996, 28 y.o., male Today's Date: 04/13/2023  PCP:  [facility] PROVIDER:  Morene Sous, PA-C   PT End of Session - 04/12/23 [phone]     Visit Number 143    Number of Visits 160   date corrected from most recent recert   Date for PT Re-Evaluation 05/14/22   Date corrected from most recent recert   Authorization Type [employer]/ [place] Medicaid    Authorization Time Period 01/04/21-03/29/21; Recert 03/24/2021-06/16/2021; Recert 09/15/2021- 12/08/2021; Recert 12/13/2021-03/07/2022    Progress Note Due on Visit 150    PT Start Time 1532    PT Stop Time 1613    PT Time Calculation (min) 41 min    Equipment Utilized During Treatment Gait belt    Activity Tolerance Patient tolerated treatment well    Behavior During Therapy WFL for tasks assessed/performed                        Past Medical History:  Diagnosis Date   Diabetes mellitus (HCC)    Hypertension    Stroke (HCC)    Past Surgical History:  Procedure Laterality Date   IVC FILTER PLACEMENT ([facility] HX)     LEG SURGERY     PEG TUBE PLACEMENT     TRACHEOSTOMY     Patient Active Problem List   Diagnosis Date Noted   Sepsis due to vancomycin  resistant Enterococcus species (HCC) 06/06/2019   SIRS (systemic inflammatory response syndrome) (HCC) 06/05/2019   Acute lower UTI 06/05/2019   VRE (vancomycin -resistant Enterococci) infection 06/05/2019   Anemia 06/05/2019   Skin ulcer of sacrum with necrosis of muscle (HCC)    Urinary retention    Type 2 diabetes mellitus without complication, with long-term current use of insulin  (HCC)    Tachycardia    Lower extremity edema    Acute metabolic encephalopathy    Obstructive sleep apnea    Morbid obesity with BMI of 60.0-69.9, adult (HCC)    Goals of care, counseling/discussion    Palliative care encounter    Sepsis (HCC) 04/27/2019   H/O insulin  dependent diabetes  mellitus 04/27/2019   History of CVA with residual deficit 04/27/2019   Seizure disorder (HCC) 04/27/2019   Decubitus ulcer of sacral region, stage 4 (HCC) 04/27/2019    REFERRING DIAG: Cerebral infarction, unspecified   THERAPY DIAG:  Muscle weakness (generalized)  Other lack of coordination  Difficulty in walking, not elsewhere classified  Unsteadiness on feet  Abnormality of gait and mobility  Rationale for Evaluation and Treatment Rehabilitation  PERTINENT HISTORY: Paul Hall is a 26yoM who presents with severe weakness, quadriparesis, altered sensorium, and visual impairment s/p critical illness and prolonged hospitalization. Pt hospitalized in October 2020 with ARDS 2/2 COVID19 infection. Pt sustained a complex and lengthy hospitalization which included tracheostomy, prolonged sedation, ECMO. In this period pt sustained CVA and SDH. Pt has now been liberated from tracheostomy and G-tube. Pt has since been hospitalized for wound infection and UTI. Pt lives with parents at home, has hospital bed and left chair, hoyer lift transfers, and power WC for mobility needs. Pt needs heavy physical assistance with ADL 2/2 BUE contractures and motor dysfunction   PRECAUTIONS: Fall  SUBJECTIVE:  Patient reports feeling really stiff from his time off over the Holidays.       PAIN:  Are you having pain? No   TODAY'S TREATMENT:  -  04/13/23     Manual Therapy:   Manual stretching to Bilateral ankles/knees, Hips  -ankle DF/PF/IV/EV/Circles- CW/CCW -Knee flex/ext -Hip flex/ext/abd/add/IR/ER/hip circles CW/CCW       Therapeutic Exercises: In chair   LAQ 2x10 each LE AROM - Heel/toe raises 2 x 10 reps      Therapeutic activities: (at edge of mat)   Static sitting with dynamic head turns - able to have conversation with PT with PT moving around and patient following him with head as he moved from left to right side.  Static standing trials x 3 - 14-36 sec (max effort from  patient and max assist +2 to remain in standing). Pt demonstrated difficulty with straightening right knee knee and increased overall physical support today.   PATIENT EDUCATION: Education details: Exercise technique with VC   HOME EXERCISE PROGRAM:  Access Code: [email or web address] URL: [email or web address] Date: 03/23/2022 Prepared by: Reyes London  Exercises - Supine Bridge  - 3 x weekly - 3 sets - 10 reps - 2 hold - Supine Gluteal Sets  - 3 x weekly - 3 sets - 10 reps - 5 sec hold - Supine Quad Set  - 1 x daily - 3 x weekly - 3 sets - 10 reps - 5 hold - Seated Long Arc Quad  - 1 x daily - 7 x weekly - 3 sets - 10 reps - Seated Hip Adduction Squeeze with Ball  - 1 x daily - 3 x weekly - 3 sets - 10 reps - 5 hold - Seated Hip Abduction  - 1 x daily - 3 x weekly - 3 sets - 10 reps - 2 hold    PT Short Term Goals -       PT SHORT TERM GOAL #1   Title Pt will be independent with HEP in order to improve strength and balance in order to decrease fall risk and improve function at home and work.    Baseline 01/04/2021= No formal HEP in place; 12/12 no HEP in place; 05/10/2021-Patient and his father were able to report compliance with curent HEP consisting of mostly seated/reclined LE strengthening. Both verbalize no questions at this time.    Time 6    Period Weeks    Status Achieved    Target Date 02/15/21            PT LONG TERM GOAL #1     Title Patient will increase BLE gross strength by 1/2 muscle grade to improve functional strength for improved independence with potential gait, increased standing tolerance and increased ADL ability.     Baseline 01/04/2021- Patient presents with 1/5 to 3-/5 B LE strength with MMT; 12/12: goal partially met for Left knee/hip; 05/10/2021= 2-/5 bilateal Hip flex; 3+/5 bilateral Knee ext; 06/21/2021= Patient presents with 2-/5 bilateral Hip flex; 3+/5 bilateral knee ext/flex; 2-/5 left ankle DF; 0/5 right ankle- and able to increase reps and  resistance with LE's. 09/15/2021- Patient technically presents with 2-/5 B hip flex/abd/add - but he is able to raise his hip up to approx 100 deg which has improved. 3+/5 Bilateral knee ext, 2-/5 left ankle and 0/5 right ankle.  12/08/2021= Patient able to lift left knee at 110 deg of hip flex; presents with 3+/5 knee ext, 2-/5 left ankle DF and 0/5 right ankle DF, 2-/5 bilateral Hip abd in seated position.    12/6: R: knee 3+/5 ext, 2/5 flexion, left knee 3+/5 extension, 3+/5 flexion, R hip: 2+/5 hip add, 2+/5 hip ABD L hip:  4-/5 hip ABD, 3+/5 hip ADD, 3+/5 hip flexion; 06/06/2022= Patient now presents with 2-/5 right ankle DF/PF;     Time 12     Period Weeks     Status MET    Target Date 03/07/2022         PT LONG TERM GOAL #2    Title Patient will tolerate sitting unsupported demonstrating erect sitting posture for 15 minutes with CGA to demonstrate improved back extensor strength and improved sitting tolerance.     Baseline 01/04/2021- Patient confied to sitting in lift chair or electric power chair with back support and unable to sit upright without physical assistance; 12/12: tolerates <1 minutes upright unsupported sitting. 05/10/2021=static sit with forward trunk lean  in his power wheelchair without back support x approx 3 min. 06/21/2021=Unable to assess today due to patient with acute back pain but on previous visit able to sit x 8 min without back support. 09/15/2021- on last visit- 09/13/2021- patient was able to sit unsupported x 8 min at edge of mat. 10/13/2023 - Patient was able to sit at edge of mat with varying level of assist today from SBA to min A for a total of 20 min. 12/13/2021= Patient demonstrated unsupported sitting at edge of mat for approx 20 min    Time 12     Period Weeks     Status GOAL MET    Target Date 12/08/21          PT LONG TERM GOAL #3    Title Patient will demonstrate ability to perform static standing in // bars > 2 min with Max Assist  without loss of balance and fair  posture for improved overall strength for pre-gait and transfer activities.     Baseline 01/04/2021= Patient current uanble to stand- Dependent on hoyer or sit to stand lift for transfers. 05/10/2021=Not appropriate yet- Currently still dependent with all transfers using hoyer. 06/21/2021= Patient continuing now to focus on LE strengthening to prepare for standing-unable to try today due to acute low back pain-  planning on attempting in new cert period. 09/15/2021- Patient has attempted standing 2x in past two week- max Assist of 2 people - only once was he successful to clearing his bottom from chair - Will continue to be a focus during the new certification. 12/13/2021= Patient has been limited secondary to increased overall low back pain during this certification and will require more time to focus on this goal.  12/6: not assessed this date, will assess at date when 2-3 PTs are present for assistance     Time 12     Period Weeks     Status GOAL not appropriate at this time - may attempt in future once Patient presents with improved overall LE strength.                 PT LONG TERM GOAL #4    Title Pt will improve FOTO score by 10 points or more demonstrating improved perceived functional ability     Baseline FOTO 7 on 10/17; 03/15/21: FOTO 12; 05/10/2021 06/21/2021= 1; 09/15/2021= 9; 12/13/2021= Will issue next visit 12/6: 4; 06/06/2022= Will assess next visit; Goal not appropriate as patient is not ambulatory.     Time 12     Period Weeks     Status WITHDRAWN    Target date 11/30/2022         PT LONG TERM GOAL #5    Title Patient will perform sit to stand transfer with  appropriate AD and max assist of 2 people with 75% consistency to prepare for pregait activities.     Baseline 09/15/2021= Patient unable to stand well- unable to clear his bottom off chair with Max assist of 2 persons. 12/13/2021- Goal not appropriate to try yet but will keep and roll over to next cert as shift continues to focus on  transfers/standing; 4/24= Patient able to perform active ankle DF/PF with right LE and able to raise his knee into seated march and clear floor without physical assist today - as previously unable as well as lift right knee ext to near full ROM to improve strength for eventual standing. 09/07/2022- Goal continues to not be appropriate but patient is now standing some and will keep goal active; 11/23/2022= patient is now performing sit to stand with max A +2 and now able to clear his bottom off mat. 11/30/2022- Patient continues to perform sit to stand transfers with Max A+2 and able to clear bottom well off mat but not erect yet-     Time 12     Period Weeks     Status PROGRESSING    Target date  02/22/2023       PT LONG TERM GOAL #6  Title Patient will tolerate sitting unsupported demonstrating erect sitting posture for 30 minutes with CGA to demonstrate improved back extensor strength and improved sitting tolerance.   Baseline 12/13/2021= Patient demonstrated unsupported sitting at edge of mat for approx 20 min;  12/29/2021- Patient performed approx 30 min of dynamic sitting activities today. 06/06/2022= Patient demonstrated ability to sit and perform static and dynamic UE/LE movement with only Supervision.   Time 12   Period Weeks   Status GOAL MET           7.  Patient will tolerate 2 minutes or more of standing in sit to stand lift or with max assist +2 in order to indicate improved lower extremity weightbearing tolerance for progression to standing in parallel bars. Baseline: 1 minute on most recent stand 02/21/22; 06/06/2022- Patient did attempt today after complaining of right LE pain last week. He attempted 3 stands using sit to stand lift. - all over 1 min- last one approx 48 sec- stopped due to fatigue. More erect standing in lift today - still poor gluteal strength but able to activate glutes and extend hips upon command but unable to hold > 5 sec. 07/28/2022- Will attempt standing next visit  but patient has been able to stand since last progress note for up to 3 min at a time at his best. 09/07/2022-  attempted standing with max +2 and patient able to stand 15-20 sec so will now  revise goal; 11/23/2022- Patient at his best between last 2 visits was able to stand approx 50 sec with max A+2.  11/30/2022- Patient performed 4 trials of static standing - max A +2- clearing bottom and using forearms to bear weight  03/15/2023: Patient performed 2 trials of static standing - max A +2- clearing edge of mat and using forearms to bear weight for approximately 45 seconds, is able to come into approximately 45 degrees of knee flexion from sit to stand Goal status: PROGRESSING Target date: 02/22/2023   8. Patient will tolerate sitting unsupported demonstrating ability to perform dynamic UE/LE activities for 30 minutes  independently to demonstrate ability to sit at edge of bed to eat or perform some ADL's/exercise for optimal quality of life.  Baseline: 06/06/2022- Patient able to sit and perform some UE/LE exercises but requires CGA at times for safety- he is able to static sit for 30 min with supervision. 07/27/2022= Patient able to now sit > 30 min with Supervision only - performing static and dynamic activities. Will keep goal active to ensure patient is consistent. 09/07/2022=  Patient able to demonstrate consistent ability to sit at edge of mat during visits             Goal Status: MET             Target date: 08/29/2022        Plan     Clinical Impression Statement Patient presented today with increased overall LE stiffness and requested stretching. States doesn't have another botox injection til end of month. He was extremely tight and sensitive to all LE stretching today. He did report feeling some better after stretching. Unable to tolerate any aggressive- remained  Pt will continue to benefit from skilled physical therapy intervention to address impairments, improve QOL, and attain  therapy goals.    Personal Factors and Comorbidities Comorbidity 3+;Time since onset of injury/illness/exacerbation    Comorbidities CVA, diabetes, Seizures    Examination-Activity Limitations Bathing;Bed Mobility;Bend;Caring for Others;Carry;Dressing;Hygiene/Grooming;Lift;Locomotion Level;Reach Overhead;Self Feeding;Sit;Squat;Stairs;Stand;Transfers;Toileting    Examination-Participation Restrictions Cleaning;Community Activity;Driving;Laundry;Medication Management;Meal Prep;Occupation;Personal Finances;Shop;Yard Work;Volunteer    Stability/Clinical Decision Making Evolving/Moderate complexity    Rehab Potential Fair    PT Frequency 2x / week    PT Duration 12 weeks    PT Treatment/Interventions ADLs/Self Care Home Management;Cryotherapy;Electrical Stimulation;Moist Heat;Ultrasound;DME Instruction;Gait training;Stair training;Functional mobility training;Therapeutic exercise;Balance training;Patient/family education;Orthotic Fit/Training;Neuromuscular re-education;Wheelchair mobility training;Manual techniques;Passive range of motion;Dry needling;Energy conservation;Taping;Visual/perceptual remediation/compensation;Joint Manipulations    PT Next Visit Plan core strength/motor control in short-sitting, sit to stand if able; Continue with progressive LE Strengthening. Stand activities as appropriate.     PT Home Exercise Plan No changes to HEP today    Consulted and Agree with Plan of Care Patient;Family member/caregiver    Family Member Consulted             03/09/2023, 8:04 AM      10:05 AM, 04/13/23 Chyrl London, PT Physical Therapist - [facility]  Outpatient Physical Therapy- [facility] [phone]

## 2023-04-13 DIAGNOSIS — G4733 Obstructive sleep apnea (adult) (pediatric): Secondary | ICD-10-CM | POA: Diagnosis not present

## 2023-04-13 DIAGNOSIS — Z993 Dependence on wheelchair: Secondary | ICD-10-CM | POA: Diagnosis not present

## 2023-04-13 DIAGNOSIS — I10 Essential (primary) hypertension: Secondary | ICD-10-CM | POA: Diagnosis not present

## 2023-04-13 DIAGNOSIS — Z9229 Personal history of other drug therapy: Secondary | ICD-10-CM | POA: Diagnosis not present

## 2023-04-13 DIAGNOSIS — Z1331 Encounter for screening for depression: Secondary | ICD-10-CM | POA: Diagnosis not present

## 2023-04-13 DIAGNOSIS — I6930 Unspecified sequelae of cerebral infarction: Secondary | ICD-10-CM | POA: Diagnosis not present

## 2023-04-13 DIAGNOSIS — Z0001 Encounter for general adult medical examination with abnormal findings: Secondary | ICD-10-CM | POA: Diagnosis not present

## 2023-04-13 DIAGNOSIS — E1165 Type 2 diabetes mellitus with hyperglycemia: Secondary | ICD-10-CM | POA: Diagnosis not present

## 2023-04-17 ENCOUNTER — Ambulatory Visit: Payer: BC Managed Care – PPO | Admitting: Occupational Therapy

## 2023-04-17 ENCOUNTER — Ambulatory Visit: Payer: BC Managed Care – PPO

## 2023-04-17 DIAGNOSIS — R269 Unspecified abnormalities of gait and mobility: Secondary | ICD-10-CM

## 2023-04-17 DIAGNOSIS — M6281 Muscle weakness (generalized): Secondary | ICD-10-CM

## 2023-04-17 DIAGNOSIS — H543 Unqualified visual loss, both eyes: Secondary | ICD-10-CM

## 2023-04-17 DIAGNOSIS — R278 Other lack of coordination: Secondary | ICD-10-CM

## 2023-04-17 DIAGNOSIS — R262 Difficulty in walking, not elsewhere classified: Secondary | ICD-10-CM | POA: Diagnosis not present

## 2023-04-17 DIAGNOSIS — R2681 Unsteadiness on feet: Secondary | ICD-10-CM | POA: Diagnosis not present

## 2023-04-17 DIAGNOSIS — I6930 Unspecified sequelae of cerebral infarction: Secondary | ICD-10-CM | POA: Diagnosis not present

## 2023-04-17 DIAGNOSIS — R2689 Other abnormalities of gait and mobility: Secondary | ICD-10-CM

## 2023-04-17 NOTE — Therapy (Signed)
 OUTPATIENT PHYSICAL THERAPY TREATMENT      Patient Name: Paul Hall MRN: [id number] DOB:January 08, 1996, 28 y.o., male Today's Date: 04/18/2023  PCP:  [facility] PROVIDER:  Morene Sous, PA-C   PT End of Session - 04/17/23 1535     Visit Number 144    Number of Visits 160   date corrected from most recent recert   Date for PT Re-Evaluation 05/14/22   Date corrected from most recent recert   Authorization Type [employer]/ [place] Medicaid    Authorization Time Period 01/04/21-03/29/21; Recert 03/24/2021-06/16/2021; Recert 09/15/2021- 12/08/2021; Recert 12/13/2021-03/07/2022    Progress Note Due on Visit 150    PT Start Time 1535    PT Stop Time 1612    PT Time Calculation (min) 37 min    Equipment Utilized During Treatment Gait belt    Activity Tolerance Patient tolerated treatment well    Behavior During Therapy WFL for tasks assessed/performed                         Past Medical History:  Diagnosis Date   Diabetes mellitus (HCC)    Hypertension    Stroke (HCC)    Past Surgical History:  Procedure Laterality Date   IVC FILTER PLACEMENT ([facility] HX)     LEG SURGERY     PEG TUBE PLACEMENT     TRACHEOSTOMY     Patient Active Problem List   Diagnosis Date Noted   Sepsis due to vancomycin  resistant Enterococcus species (HCC) 06/06/2019   SIRS (systemic inflammatory response syndrome) (HCC) 06/05/2019   Acute lower UTI 06/05/2019   VRE (vancomycin -resistant Enterococci) infection 06/05/2019   Anemia 06/05/2019   Skin ulcer of sacrum with necrosis of muscle (HCC)    Urinary retention    Type 2 diabetes mellitus without complication, with long-term current use of insulin  (HCC)    Tachycardia    Lower extremity edema    Acute metabolic encephalopathy    Obstructive sleep apnea    Morbid obesity with BMI of 60.0-69.9, adult (HCC)    Goals of care, counseling/discussion    Palliative care encounter    Sepsis (HCC) 04/27/2019   H/O insulin  dependent diabetes  mellitus 04/27/2019   History of CVA with residual deficit 04/27/2019   Seizure disorder (HCC) 04/27/2019   Decubitus ulcer of sacral region, stage 4 (HCC) 04/27/2019    REFERRING DIAG: Cerebral infarction, unspecified   THERAPY DIAG:  Muscle weakness (generalized)  Other lack of coordination  Difficulty in walking, not elsewhere classified  Unsteadiness on feet  Abnormality of gait and mobility  Other abnormalities of gait and mobility  History of CVA with residual deficit  Low vision, both eyes  Rationale for Evaluation and Treatment Rehabilitation  PERTINENT HISTORY: Paul Hall is a 26yoM who presents with severe weakness, quadriparesis, altered sensorium, and visual impairment s/p critical illness and prolonged hospitalization. Pt hospitalized in October 2020 with ARDS 2/2 COVID19 infection. Pt sustained a complex and lengthy hospitalization which included tracheostomy, prolonged sedation, ECMO. In this period pt sustained CVA and SDH. Pt has now been liberated from tracheostomy and G-tube. Pt has since been hospitalized for wound infection and UTI. Pt lives with parents at home, has hospital bed and left chair, hoyer lift transfers, and power WC for mobility needs. Pt needs heavy physical assistance with ADL 2/2 BUE contractures and motor dysfunction   PRECAUTIONS: Fall  SUBJECTIVE:  Patient reports he has a boil on his scrotum and won't  be able to do transfers or standing today. Agreed to try some Leg strengthening exercises as able.      PAIN:  Are you having pain? No   TODAY'S TREATMENT:  - 04/17/2023           Therapeutic Exercises: In chair   LAQ 2x10 each LE AROM - Heel/toe raises 2 x 10 reps  Resistive RTB - ankle DF/PF- 10 reps each LE AAROM- Hip flex- 2 sets of 10 reps  Hip circles- (Seated position with knees extended) - x 10 reps CW, 10 reps CCW Isometric Hip ADD- 5 sec hold x 10 reps  Isometric Hip ABD- 5 sec hold x 10 reps         PATIENT EDUCATION: Education details: Exercise technique with VC   HOME EXERCISE PROGRAM:  Access Code: [email or web address] URL: [email or web address] Date: 03/23/2022 Prepared by: Reyes London  Exercises - Supine Bridge  - 3 x weekly - 3 sets - 10 reps - 2 hold - Supine Gluteal Sets  - 3 x weekly - 3 sets - 10 reps - 5 sec hold - Supine Quad Set  - 1 x daily - 3 x weekly - 3 sets - 10 reps - 5 hold - Seated Long Arc Quad  - 1 x daily - 7 x weekly - 3 sets - 10 reps - Seated Hip Adduction Squeeze with Ball  - 1 x daily - 3 x weekly - 3 sets - 10 reps - 5 hold - Seated Hip Abduction  - 1 x daily - 3 x weekly - 3 sets - 10 reps - 2 hold    PT Short Term Goals -       PT SHORT TERM GOAL #1   Title Pt will be independent with HEP in order to improve strength and balance in order to decrease fall risk and improve function at home and work.    Baseline 01/04/2021= No formal HEP in place; 12/12 no HEP in place; 05/10/2021-Patient and his father were able to report compliance with curent HEP consisting of mostly seated/reclined LE strengthening. Both verbalize no questions at this time.    Time 6    Period Weeks    Status Achieved    Target Date 02/15/21            PT LONG TERM GOAL #1     Title Patient will increase BLE gross strength by 1/2 muscle grade to improve functional strength for improved independence with potential gait, increased standing tolerance and increased ADL ability.     Baseline 01/04/2021- Patient presents with 1/5 to 3-/5 B LE strength with MMT; 12/12: goal partially met for Left knee/hip; 05/10/2021= 2-/5 bilateal Hip flex; 3+/5 bilateral Knee ext; 06/21/2021= Patient presents with 2-/5 bilateral Hip flex; 3+/5 bilateral knee ext/flex; 2-/5 left ankle DF; 0/5 right ankle- and able to increase reps and resistance with LE's. 09/15/2021- Patient technically presents with 2-/5 B hip flex/abd/add - but he is able to raise his hip up to approx 100 deg which has  improved. 3+/5 Bilateral knee ext, 2-/5 left ankle and 0/5 right ankle.  12/08/2021= Patient able to lift left knee at 110 deg of hip flex; presents with 3+/5 knee ext, 2-/5 left ankle DF and 0/5 right ankle DF, 2-/5 bilateral Hip abd in seated position.    12/6: R: knee 3+/5 ext, 2/5 flexion, left knee 3+/5 extension, 3+/5 flexion, R hip: 2+/5 hip add, 2+/5 hip ABD L hip: 4-/5 hip ABD, 3+/5 hip ADD,  3+/5 hip flexion; 06/06/2022= Patient now presents with 2-/5 right ankle DF/PF;     Time 12     Period Weeks     Status MET    Target Date 03/07/2022         PT LONG TERM GOAL #2    Title Patient will tolerate sitting unsupported demonstrating erect sitting posture for 15 minutes with CGA to demonstrate improved back extensor strength and improved sitting tolerance.     Baseline 01/04/2021- Patient confied to sitting in lift chair or electric power chair with back support and unable to sit upright without physical assistance; 12/12: tolerates <1 minutes upright unsupported sitting. 05/10/2021=static sit with forward trunk lean  in his power wheelchair without back support x approx 3 min. 06/21/2021=Unable to assess today due to patient with acute back pain but on previous visit able to sit x 8 min without back support. 09/15/2021- on last visit- 09/13/2021- patient was able to sit unsupported x 8 min at edge of mat. 10/13/2023 - Patient was able to sit at edge of mat with varying level of assist today from SBA to min A for a total of 20 min. 12/13/2021= Patient demonstrated unsupported sitting at edge of mat for approx 20 min    Time 12     Period Weeks     Status GOAL MET    Target Date 12/08/21          PT LONG TERM GOAL #3    Title Patient will demonstrate ability to perform static standing in // bars > 2 min with Max Assist  without loss of balance and fair posture for improved overall strength for pre-gait and transfer activities.     Baseline 01/04/2021= Patient current uanble to stand- Dependent on hoyer or  sit to stand lift for transfers. 05/10/2021=Not appropriate yet- Currently still dependent with all transfers using hoyer. 06/21/2021= Patient continuing now to focus on LE strengthening to prepare for standing-unable to try today due to acute low back pain-  planning on attempting in new cert period. 09/15/2021- Patient has attempted standing 2x in past two week- max Assist of 2 people - only once was he successful to clearing his bottom from chair - Will continue to be a focus during the new certification. 12/13/2021= Patient has been limited secondary to increased overall low back pain during this certification and will require more time to focus on this goal.  12/6: not assessed this date, will assess at date when 2-3 PTs are present for assistance     Time 12     Period Weeks     Status GOAL not appropriate at this time - may attempt in future once Patient presents with improved overall LE strength.                 PT LONG TERM GOAL #4    Title Pt will improve FOTO score by 10 points or more demonstrating improved perceived functional ability     Baseline FOTO 7 on 10/17; 03/15/21: FOTO 12; 05/10/2021 06/21/2021= 1; 09/15/2021= 9; 12/13/2021= Will issue next visit 12/6: 4; 06/06/2022= Will assess next visit; Goal not appropriate as patient is not ambulatory.     Time 12     Period Weeks     Status WITHDRAWN    Target date 11/30/2022         PT LONG TERM GOAL #5    Title Patient will perform sit to stand transfer with appropriate AD and max assist of  2 people with 75% consistency to prepare for pregait activities.     Baseline 09/15/2021= Patient unable to stand well- unable to clear his bottom off chair with Max assist of 2 persons. 12/13/2021- Goal not appropriate to try yet but will keep and roll over to next cert as shift continues to focus on transfers/standing; 4/24= Patient able to perform active ankle DF/PF with right LE and able to raise his knee into seated march and clear floor without physical  assist today - as previously unable as well as lift right knee ext to near full ROM to improve strength for eventual standing. 09/07/2022- Goal continues to not be appropriate but patient is now standing some and will keep goal active; 11/23/2022= patient is now performing sit to stand with max A +2 and now able to clear his bottom off mat. 11/30/2022- Patient continues to perform sit to stand transfers with Max A+2 and able to clear bottom well off mat but not erect yet-     Time 12     Period Weeks     Status PROGRESSING    Target date  02/22/2023       PT LONG TERM GOAL #6  Title Patient will tolerate sitting unsupported demonstrating erect sitting posture for 30 minutes with CGA to demonstrate improved back extensor strength and improved sitting tolerance.   Baseline 12/13/2021= Patient demonstrated unsupported sitting at edge of mat for approx 20 min;  12/29/2021- Patient performed approx 30 min of dynamic sitting activities today. 06/06/2022= Patient demonstrated ability to sit and perform static and dynamic UE/LE movement with only Supervision.   Time 12   Period Weeks   Status GOAL MET           7.  Patient will tolerate 2 minutes or more of standing in sit to stand lift or with max assist +2 in order to indicate improved lower extremity weightbearing tolerance for progression to standing in parallel bars. Baseline: 1 minute on most recent stand 02/21/22; 06/06/2022- Patient did attempt today after complaining of right LE pain last week. He attempted 3 stands using sit to stand lift. - all over 1 min- last one approx 48 sec- stopped due to fatigue. More erect standing in lift today - still poor gluteal strength but able to activate glutes and extend hips upon command but unable to hold > 5 sec. 07/28/2022- Will attempt standing next visit but patient has been able to stand since last progress note for up to 3 min at a time at his best. 09/07/2022-  attempted standing with max +2 and patient able to  stand 15-20 sec so will now  revise goal; 11/23/2022- Patient at his best between last 2 visits was able to stand approx 50 sec with max A+2.  11/30/2022- Patient performed 4 trials of static standing - max A +2- clearing bottom and using forearms to bear weight  03/15/2023: Patient performed 2 trials of static standing - max A +2- clearing edge of mat and using forearms to bear weight for approximately 45 seconds, is able to come into approximately 45 degrees of knee flexion from sit to stand Goal status: PROGRESSING Target date: 02/22/2023   8. Patient will tolerate sitting unsupported demonstrating ability to perform dynamic UE/LE activities for 30 minutes  independently to demonstrate ability to sit at edge of bed to eat or perform some ADL's/exercise for optimal quality of life.             Baseline: 06/06/2022- Patient  able to sit and perform some UE/LE exercises but requires CGA at times for safety- he is able to static sit for 30 min with supervision. 07/27/2022= Patient able to now sit > 30 min with Supervision only - performing static and dynamic activities. Will keep goal active to ensure patient is consistent. 09/07/2022=  Patient able to demonstrate consistent ability to sit at edge of mat during visits             Goal Status: MET             Target date: 08/29/2022        Plan     Clinical Impression Statement Treatment modified to accommodate patient who is suffering from pain from boil on his scrotum. He was able to participate well with seated therex and no report of any increased scrotal pain. He was able to move Right LE better after warming up with more active ROM with ankle DF/PF and even able to perform with some light resistance today.  Pt will continue to benefit from skilled physical therapy intervention to address impairments, improve QOL, and attain therapy goals.    Personal Factors and Comorbidities Comorbidity 3+;Time since onset of injury/illness/exacerbation     Comorbidities CVA, diabetes, Seizures    Examination-Activity Limitations Bathing;Bed Mobility;Bend;Caring for Others;Carry;Dressing;Hygiene/Grooming;Lift;Locomotion Level;Reach Overhead;Self Feeding;Sit;Squat;Stairs;Stand;Transfers;Toileting    Examination-Participation Restrictions Cleaning;Community Activity;Driving;Laundry;Medication Management;Meal Prep;Occupation;Personal Finances;Shop;Yard Work;Volunteer    Stability/Clinical Decision Making Evolving/Moderate complexity    Rehab Potential Fair    PT Frequency 2x / week    PT Duration 12 weeks    PT Treatment/Interventions ADLs/Self Care Home Management;Cryotherapy;Electrical Stimulation;Moist Heat;Ultrasound;DME Instruction;Gait training;Stair training;Functional mobility training;Therapeutic exercise;Balance training;Patient/family education;Orthotic Fit/Training;Neuromuscular re-education;Wheelchair mobility training;Manual techniques;Passive range of motion;Dry needling;Energy conservation;Taping;Visual/perceptual remediation/compensation;Joint Manipulations    PT Next Visit Plan core strength/motor control in short-sitting, sit to stand if able; Continue with progressive LE Strengthening. Stand activities as appropriate.     PT Home Exercise Plan No changes to HEP today    Consulted and Agree with Plan of Care Patient;Family member/caregiver    Family Member Consulted             03/09/2023, 8:04 AM      8:21 AM, 04/18/23 Chyrl London, PT Physical Therapist - [facility]  Outpatient Physical Therapy- Main Campus [phone]

## 2023-04-18 NOTE — Therapy (Signed)
 Occupational Therapy Neuro Treatment Note   Patient Name: Paul Hall MRN: [id number] DOB:05/30/1995, 28 y.o., male Today's Date: 04/18/2023  PCP: Dr. Bertell REFERRING PROVIDER: Dr. Bertell   OT End of Session - 04/18/23 0811     Visit Number [id number]    Number of Visits 132    Date for OT Re-Evaluation 05/08/23    OT Start Time 1615    OT Stop Time 1700    OT Time Calculation (min) 45 min    Activity Tolerance Patient tolerated treatment well    Behavior During Therapy WFL for tasks assessed/performed                Past Medical History:  Diagnosis Date   Diabetes mellitus (HCC)    Hypertension    Stroke [facility])    Past Surgical History:  Procedure Laterality Date   IVC FILTER PLACEMENT ([facility] HX)     LEG SURGERY     PEG TUBE PLACEMENT     TRACHEOSTOMY     Patient Active Problem List   Diagnosis Date Noted   Sepsis due to vancomycin  resistant Enterococcus species (HCC) 06/06/2019   SIRS (systemic inflammatory response syndrome) (HCC) 06/05/2019   Acute lower UTI 06/05/2019   VRE (vancomycin -resistant Enterococci) infection 06/05/2019   Anemia 06/05/2019   Skin ulcer of sacrum with necrosis of muscle (HCC)    Urinary retention    Type 2 diabetes mellitus without complication, with long-term current use of insulin  (HCC)    Tachycardia    Lower extremity edema    Acute metabolic encephalopathy    Obstructive sleep apnea    Morbid obesity with BMI of 60.0-69.9, adult (HCC)    Goals of care, counseling/discussion    Palliative care encounter    Sepsis (HCC) 04/27/2019   H/O insulin  dependent diabetes mellitus 04/27/2019   History of CVA with residual deficit 04/27/2019   Seizure disorder (HCC) 04/27/2019   Decubitus ulcer of sacral region, stage 4 (HCC) 04/27/2019   ONSET DATE: 01/2019  REFERRING DIAG: CVA/COVID-19  THERAPY DIAG:  Muscle weakness (generalized)  Rationale for Evaluation and Treatment Rehabilitation  SUBJECTIVE:   SUBJECTIVE  STATEMENT:  Pt  reports doing well today.   Pt accompanied by: self and family member  PERTINENT HISTORY:  Pt. is a 28 y.o. male who was diagnosed with COVID-19, and CVA with resultant quadriplegia. Pt. Was then hospitalized with VRE UTI. PMHx includes: urinary retention, seizure disorder, obstructive sleep disorder, DM Type II, Morbid obesity.   PRECAUTIONS: None  WEIGHT BEARING RESTRICTIONS No  PAIN:  03/08/23: 0 pain at rest, 5/10 when stretching in BUEs Are you having pain? Reports right  wrist tightness.  FALLS: Has patient fallen in last 6 months? No  LIVING ENVIRONMENT: Lives with: lives with their family Lives in: House/apartment Stairs: No Level Entry Has following equipment at home: Wheelchair (power) and hoyer lift, sit to stand lift  PLOF: Independent  PATIENT GOALS: To be able to engage in more daily care tasks.  OBJECTIVE:   HAND DOMINANCE: Right  ADLs:  Transfers/ambulation related to ADLs: Eating: Pt. reports being able to hold standard utensils, and is starting to engage more in self-feeding tasks, hand to mouth patterns. Pt. Reports that he does as much as he can with the task, and family assists  with the remainder of the task. Grooming: Pt. Is able to initiate holding an electric toothbrush, and brush his teeth. Family assists LB Dressing: Total Assist UE dressing: Pt. is now able to  reach up to actively assist with grasping , and pulling his gown down. Toileting:  Total Assist Bathing: MaxA UB, Total assist LB Tub Shower transfers: N/A Equipment: See above    IADLs: Shopping: Relies on family to assist Light housekeeping: Total Assist Meal Prep: Total Assist Community mobility:   Medication management:  Total Assist  Financial management: N/A Handwriting: Not legible: Pt. Is able to hold a pen with the left hand, and initiate marking the page. Pt.'s eye glasses were not available  MOBILITY STATUS:  Power w/c  POSTURE COMMENTS:  Pt. Requires  position changes in his power w/c  ACTIVITY TOLERANCE: Activity tolerance:  Fair  FUNCTIONAL OUTCOME MEASURES: FOTO: 40  TR score: 45  05/25/2022:   FOTO: 50 TR score: 45  07/20/22: FOTO 55  03/08/23: 51  UPPER EXTREMITY ROM     Active ROM Left eval Left  03/21/2022 Left 05/25/2022 L  07/20/22 Left 07/25/2022 Left  08/15/2022 Left  09/26/2022 Left  11/16/2022 Left 12/28/2022 Left 03/13/23  Shoulder flexion 119 123 125 130  136 138 130 142 144  Shoulder abduction 110 115 115 115  118 121 125 142 140  Shoulder adduction            Shoulder extension            Shoulder internal rotation            Shoulder external rotation            Elbow flexion 135(135) 145 145 145  145    145  Elbow extension -27(-18) -21(-20) -20(-16)  -25(-10) -23(-10) -21(-10) -20(-18) -20(-18) -20(-17)  Wrist flexion            Wrist extension 10(50) 19(50) 28(50)  30(50) 35(55) 36(55) 36(60) 40(60) 45(60)  Wrist ulnar deviation            Wrist radial deviation            Wrist pronation            Wrist supination   Limited by flexor tone       10(15)  (Blank rows = not tested)    Active ROM Right eval Right 03/21/2022 Right 05/25/2022 R  07/20/22 Right  07/25/22 Right 08/15/2022 Right 09/26/2022 Right 11/16/2022 Right 12/28/2022 Right 03/13/23  Shoulder flexion 106 scaption 105  Scaption 62 flexion 110 scaption 100  105 105 105 112 Scaption 123 Scaption  Shoulder abduction 114 90 97 100  105 117 120 124 124  Shoulder adduction            Shoulder extension            Shoulder internal rotation            Shoulder external rotation            Elbow flexion 120(130) 145 145 140  144 140 142 148 148  Elbow extension -45(-35) -22(-35) -28(-22)  -32(-21) -32(-28) -28(-26) -28(-28) -27(-26) -27(-40)  Wrist flexion            Wrist extension -30(10) -25(10) -10(30)  -10(20) -10(25) -20(40) -30(10) -22(34) -22(35)  Wrist ulnar deviation            Wrist radial deviation            Wrist  pronation            Wrist supination   Limited by flexor tone           12/28/2022: Thumb radial abduction:  Right: 12(46), Left: 40(54)  Digits: Bilateral 2nd through 5th digit: MP Hyperextension with PIP, an DIP flexion     UPPER EXTREMITY MMT:     Left eval Left 03/21/2022 Left  05/25/2022 L 07/20/22 Left  08/15/2022 Left 09/26/2022 Left 11/16/2022 Left 12/28/2022 Left  03/13/2023  Shoulder flexion 3-/5 3/5 3+/5 4+/5 4+/5 4+/5 4+/5 4+/5 4+/5  Shoulder abduction 3-/5 3-/5 4-/5 4+/5 4+/5 4+5 4+/5 4+/5 4+/5  Shoulder adduction           Shoulder extension           Shoulder internal rotation           Shoulder external rotation           Elbow flexion 3/5 3+/5 4-/5 4+/5 5/5 5/5 5/5 5/5 5/5  Elbow extension 2-/5 2/5  4+/5 4+/5 4+/5  4/5 4/5  Wrist flexion           Wrist extension 2/5 2+/5 3-/5  3-/5 3-/5 3-/5 3/5 3/5  Wrist ulnar deviation           Wrist radial deviation           Wrist pronation           Wrist supination           (Blank rows = not tested)  Right eval Right 03/21/2022 Right  05/25/2022 R 07/20/22 Right 08/15/2022 Right 09/26/2022 Right 11/16/2022 Right 12/28/2022 Right 03/13/2023  Shoulder flexion 3-/5 scaption 3-/5 3-/5 3+/5 3+/5 3+/5 3/5 3/5 3/5  Shoulder abduction 3-/5 3-/5 3-/5 4/5 4/5 4/5 4/5 4/5 4/5  Shoulder adduction           Shoulder extension           Shoulder internal rotation           Shoulder external rotation           Elbow flexion 3/5 3/5 3+/5 4/5 4+/5 4+/5 4+/5 4+/5 4+/5  Elbow extension 2-/5 2/5 3-/5 4+/5 4+/5 4+/5 4+/5 4-/5 4-/5  Wrist flexion           Wrist extension 2-/5 2-/5 2/5 2/5 2/5 2/5 2/5 2/5 2/5  Wrist ulnar deviation           Wrist radial deviation           Wrist pronation           Wrist supination             HAND FUNCTION:  Grip strength: Right: 0 lbs; Left: 0 lbs and Lateral pinch: Right: 5 lbs, Left: 2 lbs  03/21/2022: Lateral pinch: Right: 3.5 lbs, Left: 2 lbs  07/20/22: Grip strength: Right: 3 lbs;  Left: 4 lbs and Lateral pinch: Right: 5 lbs, Left: 3 lbs  08/15/2022:  Grip strength: Right: 0 lbs; Left: 5 lbs and Lateral pinch: Right: 2 lbs, Left: 4 lbs  09/26/2022  Grip strength: Right: 0 lbs; Left: 8 lbs and Lateral pinch: Right: 2 lbs, Left: 5 lbs  11/16/2022  Grip strength: Right: 0 lbs; Left: 8 lbs  9/25/20204  Grip strength: Right: 0 lbs; Left: 8 lbs  Grip strength: Right: 0 lbs; Left: 8 lbs   03/08/23:  Grip strength: Right: 0 lbs without PROM, 5 lbs after PROM, Left 8 lbs  Bilateral digit PIP/DIP flexion contractures with MP hyperextension with attempts for AROM. Pt. is able to tolerate AROM to the bilateral digits at the initial evaluation however, has a history of pain in the digits.  COORDINATION: Eval: Pt. is unable to grasp 9-hole test pegs. Pt. is able to initiate grasping larger pegs, and is able to hold a pen in the left hand.  07/20/22: 2 min 36 seconds to remove 9 pegs from 9 hole peg test - cues to locate pegs 2/2 low vision. Pt. is able to initiate grasping larger pegs on R hand and is able to hold a pen in the left hand.  08/15/2022:    Vision, and sensation limiting accuracy of 9 hole peg test results. Pt. Was able to grasp and remove vertical pegs intermittently with cues.    SENSATION: Light touch: Impaired   EDEMA:  N/A  MUSCLE TONE: BUE flexor Spasticity  COGNITION Overall cognitive status: Continue to assess in functional context  VISION:   Subjective report: Pt. was not wearing glasses at the time of the initial eval.  Baseline vision: Vision is very limited. Wears glasses all the time Visual history: History of impaired vision following CVA. Pt. Has received treatment through the [facility] low vision rehabilitation program.   VISION ASSESSMENT: Impaired To be further assessed in functional context  PERCEPTION: Impaired   PRAXIS: Impaired: motor planning  OBSERVATIONS:  Pt reports being on Tramadol   TODAY'S TREATMENT   Therapeutic  Ex.:   Pt. performed AROM for right shoulder flexion, abduction, and right sided pectoral stretching. Emphasis was placed on slow prolonged gentle passive stretching for RUE, forearm supination, bilateral wrist extension, wrist extension, digit MP, PIP, and DIP extension following moist heat. ROM was performed to decrease stiffness, and prepare the RUE for functional use. ROM was performed following moist heat to the shoulder right elbow, forearm, wrist, and digits. PROM for bilateral digit MP, PIP, and DIP extension in preparation for placing them onto a flat surface.  Manual therapy:  Pt. tolerated soft tissue massage to the the volar surface of the 2nd through 5th digits, the lateral, and sagittal bands in the digits, as well as the wrist and digit flexors at the forearm 2/2 flexor tightness. Manual therapy was performed independent of, and in preparation for therapeutic Ex.    Neuromuscular re-education:  Pt. worked on grasping 1 cubes with the right hand, extending his right elbow while reaching out to place the cubes in the far side of the container.          PATIENT EDUCATION: Education details: stretching, PROM, massage to the digits on the bilateral hands. Person educated: Patient and Parent Education method: Explanation Education comprehension: verbalized understanding  HOME EXERCISE PROGRAM:  Continue ongoing assessment, and continue to provide as needed.   GOALS: Goals reviewed with patient? Yes  SHORT TERM GOALS: Target date: 11/07/2022   To assess splint fit, and make appropriate adjustments to promote good skin integrity through the palmar surface of the bilateral hands.  Baseline: 05/25/22: Goal currently met, however ongoing as needs to assess splint fit arise. 03/23/2022: Pt. is wearing splints a couple of hours at night bilateral resting hand splints. 03/21/2022: Pt. is wearing splints a couple of hours at night bilateral resting hand splints. Goal status:  Deferred   LONG TERM GOALS: Target date: 05/08/2023    FOTO score will Improve by 2 points for Pt. perceived improvement with the assessment specific ADL/IADL tasks.  Baseline:  03/08/23: 51; 12/28/2022: FOTO score: 56; 11/16/2022: 52 09/27/2022: 51 07/20/2022: FOTO 55 05/25/2022: FOTO score: 50,  TR score: 45 Eval: FOTO score: 40,  TR score: 45 Goal status: Achieved   2.   Pt. will  independently perform oral care for 100% of the task after complete set-up. Baseline: 03/08/23: not as much assist required proximally, but to adjust positioning of toothbrush; 02/13/23: Pt. Continues to require assist proximally 01/09/2023: Continue 12/28/2022:  Pt. Continues to complete 90% of the task with complete set-up, with visual cues, and occassional assist of the right elbow. 11/16/2022: Pt. Completes 90% of the task with complete set-up. 09/26/2022: Completes 90% of the task, proximal assist at times required at the elbow.  08/15/2022: Completes 90% of the task, proximal assist at times required at the elbow. 07/20/22: completes 90% of task, limited by shoulder flexion. 05/25/2022:  Pt. Is able to initiate and perform oral care for approximately 90% of the task. Complete set-up required. Assi needed only for the very back teeth. 03/23/2022: Pt. Is able to initiate and perform oral care for approximately 75% of the task. Pt. Requires assist at proximally at the elbow for through oral care. 03/21/2022: Pt. Is able to initiate and perform oral care for approximately 75% of the task. Pt. Requires assist at proximally at the elbow for through oral care. Eval: Pt. is able to initiate using an electric toothbrush. Pt. requires assist for set-up, and assist for thoroughness, and as he Pt. fatigues. Goal status: Ongoing  3.  Pt. Will be modified independence with self-feeding for 100% using a swivel spoon, and standard fork after complete set-up Baseline: 03/08/23: indep with cup with a handle and lid, assist to reposition eating  utensils in hand;  02/13/23:  Pt. Is now able to consistently, and efficiently use a swivel spoon for eating cereal with the left hand.01/09/2023: Continue 12/28/2022: Pt. Is now feeding himself 90% of the time using a swivel spoon with the left hand, and 95% of the time with the fork. Pt. Continues to have difficulty with cutting food. 11/16/2022: 100% with finger foods, Pt. Is initiating with a swivel spoon, and universal cuff for fork use, however requires assist for consistency, and accuracy.  09/26/2022: 90% using the spoon, assist at time with the forearm motion, and grip on the utensils. 100% for finger foods.  08/15/2022:  90% using the spoon, assit at time with the forearm motion, and grip on the utensils. 100% for finger foods-independent with set-up- unsupervised.  07/20/22: self-feeds cereal using spoon 90% of task. 05/25/2022: Pt. Is able to use a spoon to scoop cereal when feeding himself cereal 85% of the time. Pt. Is able to feed himself snack/finger foods 100% of the time. Pt. Continues to work on consistency of  stabilizing a cup/mug when drinking. Pt. Is able to grasp a water  bottle with assist initially, with assist tapering off as he drinks.03/23/2022: Pt. Is able to perform scooping cereal for 75% of the time. Pt. required assist, and support at the left elbow, and Pt. Presents with limited forearm supination when using the spoon, and bringing it towards his mouth. Pt. Is able to use a fork to spear items, and perform the hand to mouth pattern.  03/21/2022: Pt. Is able to perform scooping cereal for 75% of the time. Pt. required assist, and support at the left elbow, and Pt. presents with limited forearm supination when using the spoon, and bringing it towards his mouth. Pt. Is able to use a fork to spear items, and perform the hand to mouth pattern.  Eval: Pt. is able to hold standard standard utensils. Pt. Performs as much of the task as he, can and has assistance for the remainder. Goal status:  Ongoing  4.  Pt. Will improve grasp patterns and consistently grasp 1/4 objects for ADL, and IADL tasks.  Baseline: 03/08/23: 1/2 objects efficiently; 02/13/2023: 1/2 objects efficiently 01/09/2023: Continue 12/28/2022: Continues to grasp 1/4 pegs with min a + visual cues, consistently grasping 1/2 objects with visual cues 11/16/2022: Continues to grasp 1/4 pegs with min a + visual cues, consistently grasping 1/2 objects with visual cues 09/26/2022: Continues to grasp 1/4 pegs with min a + visual cues, consistently grasping 1/2 objects with visual cues. 08/15/2022: grasps 1/4 pegs with min a + visual cues, consistently grasping 1/2 objects with visual cues. 07/20/22: grasps 1/4 pegs with min a + visual cues, consistently grasping 1/2 objects with visual cues. 05/25/2022: Pt. Is working on improving consistency of grasping 1/2 objects with visual cues.  03/23/2022: Pt. Is able to grasp 1 objects consistently,and continues to work on the hand patterns needed to grasp 1/2 objects.03/21/2022: Pt. Is able to grasp 1 objects consistently,and continues to work on the hand patterns needed to grasp 1/2 objects. Eval: Pt. is able to grasp 1 objects intermittently using a lateral grasp pattern. Goal status: Ongoing  5.  Pt. will independently write his name legibly with letter sizes under 1. Baseline: 03/08/23: Not addressed this period; 02/13/2023: Continue 01/09/2023: Continue. 12/28/2022: Pt. is now using an IPad Pen. 11/16/2022: Pt. Continues to be able to write name with smaller letter size. Pt. Is signing name with a computer styus. Pt. Requires visual cues, and assist 09/26/2022: Pt. Continues to be able to write name with smaller letter size. Pt. Is signing name with a computer styus. Pt. Requires visual cues, and assist. 08/15/2022: Pt. Is able to write name with smaller letter size. Pt. Is signing name with a computer styus. Pt. Requires visual cues, and assist. 07/20/22: stabilizing assist to write  name with 75% legibility with 2 letters. 05/25/2022: Pt. to continue to work towards formulating grasp patterns in preparation for grasping a large width pen. Pt. Requires visual cues. 03/23/2022: Pt. Is able to write his name with modA, however has difficulty with formulating letter sizes less than 2 in size with 50% legibility for the 3 letters of his name.03/21/2022: Pt. Is able to write his name with modA, however has difficulty with formulating letter sizes less than 2 in size with 50% legibility for the 3 letters of his name. Eval: Pt is able to hold a thin marker with his left hand, and formulate a line, and initiate a circular pattern (Pt. without glasses today) Goal status: Ongoing  6. Pt. Will reach up to comb/brush his hair with minA.  Baseline: 03/08/23: Pt uses pick in L hand to reach 75% of his head; unable to reach R side of head; 02/13/2023:Pt. is able to use a hair pick with the left hand on the right side of his head, top, and back of the head. Pt. Continues to have difficulty reaching further to the right side of his head with the left. 01/09/2023: Continue 12/28/2022: Pt. Is progressing, and is now able to use a hair pick with the left hand on the right side of his head, top, and back of the head. Pt. Continues to have difficulty reaching further to the right side of his head with the left. 11/16/2022: Pt. Is able to use a hair pick for the left side of his head using his left hand. Pt. Presents with difficulty reaching to the right side of his head.09/26/2022: Pt. Is able to use a hair pick for  the left side of his head using his left hand. Pt. Presents with difficulty reaching to the right side of his head. 5/13/202: 75% using a hair pick, assist to the right side of the head. 07/20/22: reaches 75% of head, assist for far R side of head, fatigues quickly. 05/25/2022: Pt. Is able to reach up with the left hand to the left side, top, and back of his head. 03/23/2022: Pt. is now able to more  consistently initiate reaching up to his head with his left hand in preparation for haircare12/18/2023: Pt. is now able to more consistently initiate reaching up to his head with his left hand in preparation for haircare. Eval: Pt. is able to initiate reaching up for hair care with a long handled brush, however is unable to sustain UE's in elevation to perform the task.     Goal status: Ongoing  7. Pt. Will independently navigate the w/c through his environment with minA with visual scanning, and hand placement on the controls.  Baseline: 11/16/2022: Deferred 2/2 vision 09/26/2022: Pt. Is now navigating his w/c ouside the home, and around his block. 08/15/2022: Pt. Requires minA to setup hand on controls and MIN cues to navigate the w/c in wide spaces, requires MIN - MOD cues to navigate the w/c through more narrow doorways, and tighter turns - varies based on good vs bad vision days.07/20/22: Pt. Requires minA to setup hand on controls and MIN cues to navigate the w/c in wide spaces, requires MIN - MOD cues to navigate the w/c through more narrow doorways, and tighter turns - varies based on good vs bad vision days. 05/25/2022: Pt. Requires minA  and  cues to navigate the w/c in wide spaces, and requires Mod cues to navigate the w/c through more narrow doorways, and tighter turns. Pt. Requires max cues for scanning through the environment, and moderate cues for hand placement on the controls.     Goal status: Deferred 2/2 vision    8. Pt. Will improve bilateral grip strength to be able to independently grasp, and pull up blankets, and linens.   Baseline: 03/08/23: R grip 0-5 lbs, L grip 8 lbs; pt reports he can consistently pull up blankets and linens independently.  02/13/2023: Continue. Pt. presents with digit PIP, and DIP flexor tightness. 01/09/2023: Continue 12/28/2023: R: 0#, L: 5# Pt. Is now able to more consistently grasp and pull blankets up over him. 11/16/2022: R: 0 L: 8# Pt. Is more consistently  grasping his blankets and attempting to pull them up. 09/26/2022: R: 0  L: 8# Pt. is attempting to grasp, and pull blankets up more, using mostly the left hand. 08/15/2022: Pt. has difficulty securely holding, and pulling up blankets, and linens.    Goal status: Ongoing  9. Pt. will consistently actively control the releasing of blankets, covers, and linens from his hands once they are in the desired position over him. Baseline:12/28/2022: Pt. Is consistently actively able to release linens once they are in the desired position over him. 11/16/2022: Pt. continues to improve with actively releasing blankets form his hands. 09/26/2022: Pt. is improving with actively releasing blankets form his hands. 08/15/2022: Pt. has difficulty with controlled releasing of blankets/linens from his grasp.     Goal status: Achieved    10. Pt. Will improve bilateral UE strength by 2 MM grades to assist with ADLs, and IADLs  Baseline: 03/13/2023: Right: shoulder flexion: 3/5, abduction: 4/5, elbow flexion: 4+/5 , extension: 4-/5 wrist extension: 2/5; Left: shoulder flexion: 4+/5,  abduction: 4+/5, elbow flexion: 5/5 , extension: 4/5  wrist extension: 3/5 03/08/23: NT this date d/t as pt arrived late; 02/13/2023: Continue 01/09/2023: Continue 12/28/2022: Right: shoulder flexion: 3/5, abduction: 4/5, elbow flexion: 4+/5 , extension: 4-/5 wrist extension: 2/5; Left: shoulder flexion: 4+/5, abduction: 4+/5, elbow flexion: 5/5 , extension: 4/5  wrist extension: 3/5 Right: shoulder flexion: 3+/5, abduction: 4/5, elbow flexion: 4+/5 , extension: 4+/5 wrist extension: 2/5; Left: shoulder flexion: 4+/5, abduction: 4+/5, elbow flexion: 5/5 , extension: 4+/5 wrist extension: 3-/5 11/16/2022: Right: shoulder flexion: 3/5, abduction: 4/5, elbow flexion: 4+/5 , extension: 4+/5 wrist extension: 2/5; Left: shoulder flexion: 4+/5, abduction: 4+/5, elbow flexion: 5/5 , extension:  wrist extension: 3-/5 Right: shoulder flexion: 3+/5, abduction: 4/5,  elbow flexion: 4+/5 , extension: 4+/5 wrist extension: 2/5; Left: shoulder flexion: 4+/5, abduction: 4+/5, elbow flexion: 5/5 , extension: 4+/5 wrist extension: 3-/5 Goal status: Ongoing  11. Pt. Will improve right wrist extension by 10 degrees to initiate reaching for items in preparation for ADLs. Baseline: 03/13/23: -22(35) 03/08/23: NT this date as pt arrived late; 02/13/2023: Continue 01/09/2023: Continue 12/28/2022: -22(34) 11/16/2022: -30(10)    Goal status: Progressing   ASSESSMENT:  CLINICAL IMPRESSION:  Pt. plans to have Botox this month. Pt. presents with increased flexor tone, and tightness in the BUEs, wrists, hands, and digits with the RUE being tighter than the left.  Pt. Tolerated the moist heat, manual therapy, and stretching well. Pt. Was able to grasp 1 cubes  for 2 reps, and 1 rep of grasping 2- 1 cubes. Pt. continues to be highly motivated to continue working towards improving BUE functioning, ROM, strength, motor control, [facility] skills, as well as visual compensatory strategies in order to maximize independence with daily ADLs, and IADL functioning.      PERFORMANCE DEFICITS in functional skills including ADLs, IADLs, coordination, dexterity, proprioception, ROM, strength, pain, FMC, GMC, decreased knowledge of use of DME, and UE functional use, cognitive skills including safety awareness, and psychosocial skills including coping strategies, environmental adaptation, habits, and routines and behaviors.   IMPAIRMENTS are limiting patient from ADLs, IADLs, leisure, and social participation.   COMORBIDITIES may have co-morbidities  that affects occupational performance. Patient will benefit from skilled OT to address above impairments and improve overall function.  MODIFICATION OR ASSISTANCE TO COMPLETE EVALUATION: Maximum or significant modification of tasks or assist is necessary to complete an evaluation.  OT OCCUPATIONAL PROFILE AND HISTORY: Comprehensive assessment: Review  of records and extensive additional review of physical, cognitive, psychosocial history related to current functional performance.  CLINICAL DECISION MAKING: High - multiple treatment options, significant modification of task necessary  REHAB POTENTIAL: Fair    EVALUATION COMPLEXITY: High    PLAN: OT FREQUENCY: 2 x's a week  OT DURATION:12 weeks  PLANNED INTERVENTIONS: self care/ADL training, therapeutic exercise, therapeutic activity, neuromuscular re-education, manual therapy, passive range of motion, patient/family education, and cognitive remediation/compensation RECOMMENDED OTHER SERVICES: PT  CONSULTED AND AGREED WITH PLAN OF CARE: Patient and family member/caregiver  PLAN FOR NEXT SESSION: Review HEP  Richardson Otter, MS, OTR/L  04/18/2023

## 2023-04-19 ENCOUNTER — Ambulatory Visit: Payer: BC Managed Care – PPO

## 2023-04-19 ENCOUNTER — Ambulatory Visit: Payer: BC Managed Care – PPO | Admitting: Occupational Therapy

## 2023-04-21 DIAGNOSIS — R32 Unspecified urinary incontinence: Secondary | ICD-10-CM | POA: Diagnosis not present

## 2023-04-21 DIAGNOSIS — L8994 Pressure ulcer of unspecified site, stage 4: Secondary | ICD-10-CM | POA: Diagnosis not present

## 2023-04-21 DIAGNOSIS — I6389 Other cerebral infarction: Secondary | ICD-10-CM | POA: Diagnosis not present

## 2023-04-24 ENCOUNTER — Ambulatory Visit: Payer: BC Managed Care – PPO

## 2023-04-24 ENCOUNTER — Ambulatory Visit: Payer: BC Managed Care – PPO | Admitting: Occupational Therapy

## 2023-04-24 DIAGNOSIS — G4733 Obstructive sleep apnea (adult) (pediatric): Secondary | ICD-10-CM | POA: Diagnosis not present

## 2023-04-26 ENCOUNTER — Ambulatory Visit: Payer: BC Managed Care – PPO | Admitting: Occupational Therapy

## 2023-04-26 ENCOUNTER — Ambulatory Visit: Payer: BC Managed Care – PPO

## 2023-05-01 ENCOUNTER — Ambulatory Visit: Payer: BC Managed Care – PPO

## 2023-05-01 ENCOUNTER — Ambulatory Visit: Payer: BC Managed Care – PPO | Admitting: Occupational Therapy

## 2023-05-01 DIAGNOSIS — R278 Other lack of coordination: Secondary | ICD-10-CM

## 2023-05-01 DIAGNOSIS — I6930 Unspecified sequelae of cerebral infarction: Secondary | ICD-10-CM

## 2023-05-01 DIAGNOSIS — M6281 Muscle weakness (generalized): Secondary | ICD-10-CM | POA: Diagnosis not present

## 2023-05-01 DIAGNOSIS — R2689 Other abnormalities of gait and mobility: Secondary | ICD-10-CM

## 2023-05-01 DIAGNOSIS — R2681 Unsteadiness on feet: Secondary | ICD-10-CM | POA: Diagnosis not present

## 2023-05-01 DIAGNOSIS — R262 Difficulty in walking, not elsewhere classified: Secondary | ICD-10-CM | POA: Diagnosis not present

## 2023-05-01 DIAGNOSIS — R269 Unspecified abnormalities of gait and mobility: Secondary | ICD-10-CM

## 2023-05-01 DIAGNOSIS — H543 Unqualified visual loss, both eyes: Secondary | ICD-10-CM | POA: Diagnosis not present

## 2023-05-01 NOTE — Therapy (Addendum)
Occupational Therapy Neuro Treatment Note   Patient Name: Paul Hall MRN: [id number] DOB:05-11-1995, 28 y.o., male Today's Date: 05/01/2023  PCP: Dr. Sherwood Gambler REFERRING PROVIDER: Dr. Sherwood Gambler   OT End of Session - 05/01/23 1712     Visit Number [id number]    Number of Visits 132    Date for OT Re-Evaluation 05/08/23    OT Start Time 1605    OT Stop Time 1645    OT Time Calculation (min) 40 min    Activity Tolerance Patient tolerated treatment well    Behavior During Therapy WFL for tasks assessed/performed                Past Medical History:  Diagnosis Date   Diabetes mellitus (HCC)    Hypertension    Stroke [facility])    Past Surgical History:  Procedure Laterality Date   IVC FILTER PLACEMENT ([facility] HX)     LEG SURGERY     PEG TUBE PLACEMENT     TRACHEOSTOMY     Patient Active Problem List   Diagnosis Date Noted   Sepsis due to vancomycin resistant Enterococcus species (HCC) 06/06/2019   SIRS (systemic inflammatory response syndrome) (HCC) 06/05/2019   Acute lower UTI 06/05/2019   VRE (vancomycin-resistant Enterococci) infection 06/05/2019   Anemia 06/05/2019   Skin ulcer of sacrum with necrosis of muscle (HCC)    Urinary retention    Type 2 diabetes mellitus without complication, with long-term current use of insulin (HCC)    Tachycardia    Lower extremity edema    Acute metabolic encephalopathy    Obstructive sleep apnea    Morbid obesity with BMI of 60.0-69.9, adult (HCC)    Goals of care, counseling/discussion    Palliative care encounter    Sepsis (HCC) 04/27/2019   H/O insulin dependent diabetes mellitus 04/27/2019   History of CVA with residual deficit 04/27/2019   Seizure disorder (HCC) 04/27/2019   Decubitus ulcer of sacral region, stage 4 (HCC) 04/27/2019   ONSET DATE: 01/2019  REFERRING DIAG: CVA/COVID-19  THERAPY DIAG:  Muscle weakness (generalized)  Other lack of coordination  Rationale for Evaluation and Treatment  Rehabilitation  SUBJECTIVE:   SUBJECTIVE STATEMENT:  Pt  reports having a cough today that is bothering him.  Pt accompanied by: self and family member  PERTINENT HISTORY:  Pt. is a 28 y.o. male who was diagnosed with COVID-19, and CVA with resultant quadriplegia. Pt. Was then hospitalized with VRE UTI. PMHx includes: urinary retention, seizure disorder, obstructive sleep disorder, DM Type II, Morbid obesity.   PRECAUTIONS: None  WEIGHT BEARING RESTRICTIONS No  PAIN:  No reports of pain Are you having pain? Reports right  wrist tightness.  FALLS: Has patient fallen in last 6 months? No  LIVING ENVIRONMENT: Lives with: lives with their family Lives in: House/apartment Stairs: No Level Entry Has following equipment at home: Wheelchair (power) and hoyer lift, sit to stand lift  PLOF: Independent  PATIENT GOALS: To be able to engage in more daily care tasks.  OBJECTIVE:   HAND DOMINANCE: Right  ADLs:  Transfers/ambulation related to ADLs: Eating: Pt. reports being able to hold standard utensils, and is starting to engage more in self-feeding tasks, hand to mouth patterns. Pt. Reports that he does as much as he can with the task, and family assists  with the remainder of the task. Grooming: Pt. Is able to initiate holding an electric toothbrush, and brush his teeth. Family assists LB Dressing: Total Assist UE dressing: Pt. is  now able to reach up to actively assist with grasping , and pulling his gown down. Toileting:  Total Assist Bathing: MaxA UB, Total assist LB Tub Shower transfers: N/A Equipment: See above    IADLs: Shopping: Relies on family to assist Light housekeeping: Total Assist Meal Prep: Total Assist Community mobility:   Medication management:  Total Assist  Financial management: N/A Handwriting: Not legible: Pt. Is able to hold a pen with the left hand, and initiate marking the page. Pt.'s eye glasses were not available  MOBILITY STATUS:  Power  w/c  POSTURE COMMENTS:  Pt. Requires position changes in his power w/c  ACTIVITY TOLERANCE: Activity tolerance:  Fair  FUNCTIONAL OUTCOME MEASURES: FOTO: 40  TR score: 45  05/25/2022:   FOTO: 50 TR score: 45  07/20/22: FOTO 55  03/08/23: 51  UPPER EXTREMITY ROM     Active ROM Left eval Left  03/21/2022 Left 05/25/2022 L  07/20/22 Left 07/25/2022 Left  08/15/2022 Left  09/26/2022 Left  11/16/2022 Left 12/28/2022 Left 03/13/23  Shoulder flexion 119 123 125 130  136 138 130 142 144  Shoulder abduction 110 115 115 115  118 121 125 142 140  Shoulder adduction            Shoulder extension            Shoulder internal rotation            Shoulder external rotation            Elbow flexion 135(135) 145 145 145  145    145  Elbow extension -27(-18) -21(-20) -20(-16)  -25(-10) -23(-10) -21(-10) -20(-18) -20(-18) -20(-17)  Wrist flexion            Wrist extension 10(50) 19(50) 28(50)  30(50) 35(55) 36(55) 36(60) 40(60) 45(60)  Wrist ulnar deviation            Wrist radial deviation            Wrist pronation            Wrist supination   Limited by flexor tone       10(15)  (Blank rows = not tested)    Active ROM Right eval Right 03/21/2022 Right 05/25/2022 R  07/20/22 Right  07/25/22 Right 08/15/2022 Right 09/26/2022 Right 11/16/2022 Right 12/28/2022 Right 03/13/23  Shoulder flexion 106 scaption 105  Scaption 62 flexion 110 scaption 100  105 105 105 112 Scaption 123 Scaption  Shoulder abduction 114 90 97 100  105 117 120 124 124  Shoulder adduction            Shoulder extension            Shoulder internal rotation            Shoulder external rotation            Elbow flexion 120(130) 145 145 140  144 140 142 148 148  Elbow extension -45(-35) -22(-35) -28(-22)  -32(-21) -32(-28) -28(-26) -28(-28) -27(-26) -27(-40)  Wrist flexion            Wrist extension -30(10) -25(10) -10(30)  -10(20) -10(25) -20(40) -30(10) -22(34) -22(35)  Wrist ulnar deviation            Wrist  radial deviation            Wrist pronation            Wrist supination   Limited by flexor tone           12/28/2022:  Thumb radial abduction: Right: 12(46), Left: 40(54)  Digits: Bilateral 2nd through 5th digit: MP Hyperextension with PIP, an DIP flexion     UPPER EXTREMITY MMT:     Left eval Left 03/21/2022 Left  05/25/2022 L 07/20/22 Left  08/15/2022 Left 09/26/2022 Left 11/16/2022 Left 12/28/2022 Left  03/13/2023  Shoulder flexion 3-/5 3/5 3+/5 4+/5 4+/5 4+/5 4+/5 4+/5 4+/5  Shoulder abduction 3-/5 3-/5 4-/5 4+/5 4+/5 4+5 4+/5 4+/5 4+/5  Shoulder adduction           Shoulder extension           Shoulder internal rotation           Shoulder external rotation           Elbow flexion 3/5 3+/5 4-/5 4+/5 5/5 5/5 5/5 5/5 5/5  Elbow extension 2-/5 2/5  4+/5 4+/5 4+/5  4/5 4/5  Wrist flexion           Wrist extension 2/5 2+/5 3-/5  3-/5 3-/5 3-/5 3/5 3/5  Wrist ulnar deviation           Wrist radial deviation           Wrist pronation           Wrist supination           (Blank rows = not tested)  Right eval Right 03/21/2022 Right  05/25/2022 R 07/20/22 Right 08/15/2022 Right 09/26/2022 Right 11/16/2022 Right 12/28/2022 Right 03/13/2023  Shoulder flexion 3-/5 scaption 3-/5 3-/5 3+/5 3+/5 3+/5 3/5 3/5 3/5  Shoulder abduction 3-/5 3-/5 3-/5 4/5 4/5 4/5 4/5 4/5 4/5  Shoulder adduction           Shoulder extension           Shoulder internal rotation           Shoulder external rotation           Elbow flexion 3/5 3/5 3+/5 4/5 4+/5 4+/5 4+/5 4+/5 4+/5  Elbow extension 2-/5 2/5 3-/5 4+/5 4+/5 4+/5 4+/5 4-/5 4-/5  Wrist flexion           Wrist extension 2-/5 2-/5 2/5 2/5 2/5 2/5 2/5 2/5 2/5  Wrist ulnar deviation           Wrist radial deviation           Wrist pronation           Wrist supination             HAND FUNCTION:  Grip strength: Right: 0 lbs; Left: 0 lbs and Lateral pinch: Right: 5 lbs, Left: 2 lbs  03/21/2022: Lateral pinch: Right: 3.5 lbs, Left: 2  lbs  07/20/22: Grip strength: Right: 3 lbs; Left: 4 lbs and Lateral pinch: Right: 5 lbs, Left: 3 lbs  08/15/2022:  Grip strength: Right: 0 lbs; Left: 5 lbs and Lateral pinch: Right: 2 lbs, Left: 4 lbs  09/26/2022  Grip strength: Right: 0 lbs; Left: 8 lbs and Lateral pinch: Right: 2 lbs, Left: 5 lbs  11/16/2022  Grip strength: Right: 0 lbs; Left: 8 lbs  9/25/20204  Grip strength: Right: 0 lbs; Left: 8 lbs  Grip strength: Right: 0 lbs; Left: 8 lbs   03/08/23:  Grip strength: Right: 0 lbs without PROM, 5 lbs after PROM, Left 8 lbs  Bilateral digit PIP/DIP flexion contractures with MP hyperextension with attempts for AROM. Pt. is able to tolerate AROM to the bilateral digits at the initial evaluation however, has a history of pain in  the digits.  COORDINATION: Eval: Pt. is unable to grasp 9-hole test pegs. Pt. is able to initiate grasping larger pegs, and is able to hold a pen in the left hand.  07/20/22: 2 min 36 seconds to remove 9 pegs from 9 hole peg test - cues to locate pegs 2/2 low vision. Pt. is able to initiate grasping larger pegs on R hand and is able to hold a pen in the left hand.  08/15/2022:    Vision, and sensation limiting accuracy of 9 hole peg test results. Pt. Was able to grasp and remove vertical pegs intermittently with cues.    SENSATION: Light touch: Impaired   EDEMA:  N/A  MUSCLE TONE: BUE flexor Spasticity  COGNITION Overall cognitive status: Continue to assess in functional context  VISION:   Subjective report: Pt. was not wearing glasses at the time of the initial eval.  Baseline vision: Vision is very limited. Wears glasses all the time Visual history: History of impaired vision following CVA. Pt. Has received treatment through the [facility] low vision rehabilitation program.   VISION ASSESSMENT: Impaired To be further assessed in functional context  PERCEPTION: Impaired   PRAXIS: Impaired: motor planning  OBSERVATIONS:  Pt reports being on  Tramadol   TODAY'S TREATMENT   Therapeutic Ex.:   Pt. performed AROM for right shoulder flexion, abduction, and right sided pectoral stretching. Emphasis was placed on slow prolonged gentle passive stretching for RUE, forearm supination, bilateral wrist extension, wrist extension, digit MP, PIP, and DIP extension following moist heat. ROM was performed to decrease stiffness, and prepare the RUE for functional use. ROM was performed following moist heat to the shoulder right elbow, forearm, wrist, and digits. PROM for bilateral digit MP, PIP, and DIP extension in preparation for placing them onto a flat surface.       PATIENT EDUCATION: Education details: stretching, PROM, massage to the digits on the bilateral hands. Person educated: Patient and Parent Education method: Explanation Education comprehension: verbalized understanding  HOME EXERCISE PROGRAM:  Continue ongoing assessment, and continue to provide as needed.   GOALS: Goals reviewed with patient? Yes  SHORT TERM GOALS: Target date: 11/07/2022   To assess splint fit, and make appropriate adjustments to promote good skin integrity through the palmar surface of the bilateral hands.  Baseline: 05/25/22: Goal currently met, however ongoing as needs to assess splint fit arise. 03/23/2022: Pt. is wearing splints a couple of hours at night bilateral resting hand splints. 03/21/2022: Pt. is wearing splints a couple of hours at night bilateral resting hand splints. Goal status: Deferred   LONG TERM GOALS: Target date: 05/08/2023    FOTO score will Improve by 2 points for Pt. perceived improvement with the assessment specific ADL/IADL tasks.  Baseline:  03/08/23: 51; 12/28/2022: FOTO score: 56; 11/16/2022: 52 09/27/2022: 51 07/20/2022: FOTO 55 05/25/2022: FOTO score: 50,  TR score: 45 Eval: FOTO score: 40,  TR score: 45 Goal status: Achieved   2.   Pt. will independently perform oral care for 100% of the task after complete  set-up. Baseline: 03/08/23: not as much assist required proximally, but to adjust positioning of toothbrush; 02/13/23: Pt. Continues to require assist proximally 01/09/2023: Continue 12/28/2022:  Pt. Continues to complete 90% of the task with complete set-up, with visual cues, and occassional assist of the right elbow. 11/16/2022: Pt. Completes 90% of the task with complete set-up. 09/26/2022: Completes 90% of the task, proximal assist at times required at the elbow.  08/15/2022: Completes 90% of the  task, proximal assist at times required at the elbow. 07/20/22: completes 90% of task, limited by shoulder flexion. 05/25/2022:  Pt. Is able to initiate and perform oral care for approximately 90% of the task. Complete set-up required. Assi needed only for the very back teeth. 03/23/2022: Pt. Is able to initiate and perform oral care for approximately 75% of the task. Pt. Requires assist at proximally at the elbow for through oral care. 03/21/2022: Pt. Is able to initiate and perform oral care for approximately 75% of the task. Pt. Requires assist at proximally at the elbow for through oral care. Eval: Pt. is able to initiate using an electric toothbrush. Pt. requires assist for set-up, and assist for thoroughness, and as he Pt. fatigues. Goal status: Ongoing  3.  Pt. Will be modified independence with self-feeding for 100% using a swivel spoon, and standard fork after complete set-up Baseline: 03/08/23: indep with cup with a handle and lid, assist to reposition eating utensils in hand;  02/13/23:  Pt. Is now able to consistently, and efficiently use a swivel spoon for eating cereal with the left hand.01/09/2023: Continue 12/28/2022: Pt. Is now feeding himself 90% of the time using a swivel spoon with the left hand, and 95% of the time with the fork. Pt. Continues to have difficulty with cutting food. 11/16/2022: 100% with finger foods, Pt. Is initiating with a swivel spoon, and universal cuff for fork use, however requires  assist for consistency, and accuracy.  09/26/2022: 90% using the spoon, assist at time with the forearm motion, and grip on the utensils. 100% for finger foods.  08/15/2022:  90% using the spoon, assit at time with the forearm motion, and grip on the utensils. 100% for finger foods-independent with set-up- unsupervised.  07/20/22: self-feeds cereal using spoon 90% of task. 05/25/2022: Pt. Is able to use a spoon to scoop cereal when feeding himself cereal 85% of the time. Pt. Is able to feed himself snack/finger foods 100% of the time. Pt. Continues to work on consistency of  stabilizing a cup/mug when drinking. Pt. Is able to grasp a water bottle with assist initially, with assist tapering off as he drinks.03/23/2022: Pt. Is able to perform scooping cereal for 75% of the time. Pt. required assist, and support at the left elbow, and Pt. Presents with limited forearm supination when using the spoon, and bringing it towards his mouth. Pt. Is able to use a fork to spear items, and perform the hand to mouth pattern.  03/21/2022: Pt. Is able to perform scooping cereal for 75% of the time. Pt. required assist, and support at the left elbow, and Pt. presents with limited forearm supination when using the spoon, and bringing it towards his mouth. Pt. Is able to use a fork to spear items, and perform the hand to mouth pattern.  Eval: Pt. is able to hold standard standard utensils. Pt. Performs as much of the task as he, can and has assistance for the remainder. Goal status:   Ongoing  4.  Pt. Will improve grasp patterns and consistently grasp 1/4" objects for ADL, and IADL tasks.  Baseline: 03/08/23: 1/2" objects efficiently; 02/13/2023: 1/2" objects efficiently 01/09/2023: Continue 12/28/2022: Continues to grasp 1/4" pegs with min a + visual cues, consistently grasping 1/2" objects with visual cues 11/16/2022: Continues to grasp 1/4" pegs with min a + visual cues, consistently grasping 1/2" objects with visual cues 09/26/2022:  Continues to grasp 1/4" pegs with min a + visual cues, consistently grasping 1/2" objects with visual  cues. 08/15/2022: grasps 1/4" pegs with min a + visual cues, consistently grasping 1/2" objects with visual cues. 07/20/22: grasps 1/4" pegs with min a + visual cues, consistently grasping 1/2" objects with visual cues. 05/25/2022: Pt. Is working on improving consistency of grasping 1/2" objects with visual cues.  03/23/2022: Pt. Is able to grasp 1" objects consistently,and continues to work on the hand patterns needed to grasp 1/2" objects.03/21/2022: Pt. Is able to grasp 1" objects consistently,and continues to work on the hand patterns needed to grasp 1/2" objects. Eval: Pt. is able to grasp 1" objects intermittently using a lateral grasp pattern. Goal status: Ongoing  5.  Pt. will independently write his name legibly with letter sizes under 1". Baseline: 03/08/23: Not addressed this period; 02/13/2023: Continue 01/09/2023: Continue. 12/28/2022: Pt. is now using an IPad Pen. 11/16/2022: Pt. Continues to be able to write name with smaller letter size. Pt. Is signing name with a computer styus. Pt. Requires visual cues, and assist 09/26/2022: Pt. Continues to be able to write name with smaller letter size. Pt. Is signing name with a computer styus. Pt. Requires visual cues, and assist. 08/15/2022: Pt. Is able to write name with smaller letter size. Pt. Is signing name with a computer styus. Pt. Requires visual cues, and assist. 07/20/22: stabilizing assist to write name with 75% legibility with 2" letters. 05/25/2022: Pt. to continue to work towards formulating grasp patterns in preparation for grasping a large width pen. Pt. Requires visual cues. 03/23/2022: Pt. Is able to write his name with modA, however has difficulty with formulating letter sizes less than 2" in size with 50% legibility for the 3 letters of his name.03/21/2022: Pt. Is able to write his name with modA, however has difficulty with formulating letter  sizes less than 2" in size with 50% legibility for the 3 letters of his name. Eval: Pt is able to hold a thin marker with his left hand, and formulate a line, and initiate a circular pattern (Pt. without glasses today) Goal status: Ongoing  6. Pt. Will reach up to comb/brush his hair with minA.  Baseline: 03/08/23: Pt uses pick in L hand to reach 75% of his head; unable to reach R side of head; 02/13/2023:Pt. is able to use a hair pick with the left hand on the right side of his head, top, and back of the head. Pt. Continues to have difficulty reaching further to the right side of his head with the left. 01/09/2023: Continue 12/28/2022: Pt. Is progressing, and is now able to use a hair pick with the left hand on the right side of his head, top, and back of the head. Pt. Continues to have difficulty reaching further to the right side of his head with the left. 11/16/2022: Pt. Is able to use a hair pick for the left side of his head using his left hand. Pt. Presents with difficulty reaching to the right side of his head.09/26/2022: Pt. Is able to use a hair pick for the left side of his head using his left hand. Pt. Presents with difficulty reaching to the right side of his head. 5/13/202: 75% using a hair pick, assist to the right side of the head. 07/20/22: reaches 75% of head, assist for far R side of head, fatigues quickly. 05/25/2022: Pt. Is able to reach up with the left hand to the left side, top, and back of his head. 03/23/2022: Pt. is now able to more consistently initiate reaching up to his head with his  left hand in preparation for haircare12/18/2023: Pt. is now able to more consistently initiate reaching up to his head with his left hand in preparation for haircare. Eval: Pt. is able to initiate reaching up for hair care with a long handled brush, however is unable to sustain UE's in elevation to perform the task.     Goal status: Ongoing  7. Pt. Will independently navigate the w/c through his  environment with minA with visual scanning, and hand placement on the controls.  Baseline: 11/16/2022: Deferred 2/2 vision 09/26/2022: Pt. Is now navigating his w/c ouside the home, and around his block. 08/15/2022: Pt. Requires minA to setup hand on controls and MIN cues to navigate the w/c in wide spaces, requires MIN - MOD cues to navigate the w/c through more narrow doorways, and tighter turns - varies based on good vs bad vision days.07/20/22: Pt. Requires minA to setup hand on controls and MIN cues to navigate the w/c in wide spaces, requires MIN - MOD cues to navigate the w/c through more narrow doorways, and tighter turns - varies based on good vs bad vision days. 05/25/2022: Pt. Requires minA  and  cues to navigate the w/c in wide spaces, and requires Mod cues to navigate the w/c through more narrow doorways, and tighter turns. Pt. Requires max cues for scanning through the environment, and moderate cues for hand placement on the controls.     Goal status: Deferred 2/2 vision    8. Pt. Will improve bilateral grip strength to be able to independently grasp, and pull up blankets, and linens.   Baseline: 03/08/23: R grip 0-5 lbs, L grip 8 lbs; pt reports he can consistently pull up blankets and linens independently.  02/13/2023: Continue. Pt. presents with digit PIP, and DIP flexor tightness. 01/09/2023: Continue 12/28/2023: R: 0#, L: 5# Pt. Is now able to more consistently grasp and pull blankets up over him. 11/16/2022: R: 0 L: 8# Pt. Is more consistently grasping his blankets and attempting to pull them up. 09/26/2022: R: 0  L: 8# Pt. is attempting to grasp, and pull blankets up more, using mostly the left hand. 08/15/2022: Pt. has difficulty securely holding, and pulling up blankets, and linens.    Goal status: Ongoing  9. Pt. will consistently actively control the releasing of blankets, covers, and linens from his hands once they are in the desired position over him. Baseline:12/28/2022: Pt. Is consistently  actively able to release linens once they are in the desired position over him. 11/16/2022: Pt. continues to improve with actively releasing blankets form his hands. 09/26/2022: Pt. is improving with actively releasing blankets form his hands. 08/15/2022: Pt. has difficulty with controlled releasing of blankets/linens from his grasp.     Goal status: Achieved    10. Pt. Will improve bilateral UE strength by 2 MM grades to assist with ADLs, and IADLs  Baseline: 03/13/2023: Right: shoulder flexion: 3/5, abduction: 4/5, elbow flexion: 4+/5 , extension: 4-/5 wrist extension: 2/5; Left: shoulder flexion: 4+/5, abduction: 4+/5, elbow flexion: 5/5 , extension: 4/5  wrist extension: 3/5 03/08/23: NT this date d/t as pt arrived late; 02/13/2023: Continue 01/09/2023: Continue 12/28/2022: Right: shoulder flexion: 3/5, abduction: 4/5, elbow flexion: 4+/5 , extension: 4-/5 wrist extension: 2/5; Left: shoulder flexion: 4+/5, abduction: 4+/5, elbow flexion: 5/5 , extension: 4/5  wrist extension: 3/5 Right: shoulder flexion: 3+/5, abduction: 4/5, elbow flexion: 4+/5 , extension: 4+/5 wrist extension: 2/5; Left: shoulder flexion: 4+/5, abduction: 4+/5, elbow flexion: 5/5 , extension: 4+/5 wrist extension: 3-/5  11/16/2022: Right: shoulder flexion: 3/5, abduction: 4/5, elbow flexion: 4+/5 , extension: 4+/5 wrist extension: 2/5; Left: shoulder flexion: 4+/5, abduction: 4+/5, elbow flexion: 5/5 , extension:  wrist extension: 3-/5 Right: shoulder flexion: 3+/5, abduction: 4/5, elbow flexion: 4+/5 , extension: 4+/5 wrist extension: 2/5; Left: shoulder flexion: 4+/5, abduction: 4+/5, elbow flexion: 5/5 , extension: 4+/5 wrist extension: 3-/5 Goal status: Ongoing  11. Pt. Will improve right wrist extension by 10 degrees to initiate reaching for items in preparation for ADLs. Baseline: 03/13/23: -22(35) 03/08/23: NT this date as pt arrived late; 02/13/2023: Continue 01/09/2023: Continue 12/28/2022: -22(34) 11/16/2022: -30(10)    Goal  status: Progressing   ASSESSMENT:  CLINICAL IMPRESSION:  Pt. reports [facility] had to cancel his clinic appointment for Botox last week. Pt. reports that he is on a waiting list. Pt. Presents with increased coughing today,  and Pt. reports that it began on the therapy Zenaida Niece ride here today. Pt. presents with increased left UE flexor tone, and tightness in the RUE, with bilateral PIP, and DIP flexor contractures.  Pt. continues to be highly motivated to continue working towards improving BUE functioning, ROM, strength, motor control, [facility] skills, as well as visual compensatory strategies in order to maximize independence with daily ADLs, and IADL functioning.      PERFORMANCE DEFICITS in functional skills including ADLs, IADLs, coordination, dexterity, proprioception, ROM, strength, pain, FMC, GMC, decreased knowledge of use of DME, and UE functional use, cognitive skills including safety awareness, and psychosocial skills including coping strategies, environmental adaptation, habits, and routines and behaviors.   IMPAIRMENTS are limiting patient from ADLs, IADLs, leisure, and social participation.   COMORBIDITIES may have co-morbidities  that affects occupational performance. Patient will benefit from skilled OT to address above impairments and improve overall function.  MODIFICATION OR ASSISTANCE TO COMPLETE EVALUATION: Maximum or significant modification of tasks or assist is necessary to complete an evaluation.  OT OCCUPATIONAL PROFILE AND HISTORY: Comprehensive assessment: Review of records and extensive additional review of physical, cognitive, psychosocial history related to current functional performance.  CLINICAL DECISION MAKING: High - multiple treatment options, significant modification of task necessary  REHAB POTENTIAL: Fair    EVALUATION COMPLEXITY: High    PLAN: OT FREQUENCY: 2 x's a week  OT DURATION:12 weeks  PLANNED INTERVENTIONS: self care/ADL training, therapeutic  exercise, therapeutic activity, neuromuscular re-education, manual therapy, passive range of motion, patient/family education, and cognitive remediation/compensation RECOMMENDED OTHER SERVICES: PT  CONSULTED AND AGREED WITH PLAN OF CARE: Patient and family member/caregiver  PLAN FOR NEXT SESSION: Review HEP  Olegario Messier, MS, OTR/L  05/01/2023

## 2023-05-01 NOTE — Therapy (Signed)
OUTPATIENT PHYSICAL THERAPY TREATMENT      Patient Name: Paul Hall MRN: [id number] DOB:1995-04-24, 28 y.o., male Today's Date: 05/01/2023  PCP:  Cindi Carbon PROVIDER:  Lenise Herald, PA-C   PT End of Session - 05/01/23 1543     Visit Number 145    Number of Visits 160   date corrected from most recent recert   Date for PT Re-Evaluation 05/14/22   Date corrected from most recent recert   Authorization Type [employer] COMM [employer]/ [place] [employer]    Authorization Time Period 01/04/21-03/29/21; Recert 03/24/2021-06/16/2021; Recert 09/15/2021- 12/08/2021; Recert 12/13/2021-03/07/2022    Progress Note Due on Visit 150    PT Start Time 1532    PT Stop Time 1600    PT Time Calculation (min) 28 min    Equipment Utilized During Treatment Gait belt    Activity Tolerance Patient tolerated treatment well    Behavior During Therapy WFL for tasks assessed/performed                         Past Medical History:  Diagnosis Date   Diabetes mellitus (HCC)    Hypertension    Stroke (HCC)    Past Surgical History:  Procedure Laterality Date   IVC FILTER PLACEMENT ([facility] HX)     LEG SURGERY     PEG TUBE PLACEMENT     TRACHEOSTOMY     Patient Active Problem List   Diagnosis Date Noted   Sepsis due to vancomycin resistant Enterococcus species (HCC) 06/06/2019   SIRS (systemic inflammatory response syndrome) (HCC) 06/05/2019   Acute lower UTI 06/05/2019   VRE (vancomycin-resistant Enterococci) infection 06/05/2019   Anemia 06/05/2019   Skin ulcer of sacrum with necrosis of muscle (HCC)    Urinary retention    Type 2 diabetes mellitus without complication, with long-term current use of insulin (HCC)    Tachycardia    Lower extremity edema    Acute metabolic encephalopathy    Obstructive sleep apnea    Morbid obesity with BMI of 60.0-69.9, adult (HCC)    Goals of care, counseling/discussion    Palliative care encounter    Sepsis (HCC) 04/27/2019   H/O insulin dependent diabetes  mellitus 04/27/2019   History of CVA with residual deficit 04/27/2019   Seizure disorder (HCC) 04/27/2019   Decubitus ulcer of sacral region, stage 4 (HCC) 04/27/2019    REFERRING DIAG: Cerebral infarction, unspecified   THERAPY DIAG:  Muscle weakness (generalized)  Other lack of coordination  Difficulty in walking, not elsewhere classified  Unsteadiness on feet  Abnormality of gait and mobility  Other abnormalities of gait and mobility  History of CVA with residual deficit  Rationale for Evaluation and Treatment Rehabilitation  PERTINENT HISTORY: Paul Hall is a 26yoM who presents with severe weakness, quadriparesis, altered sensorium, and visual impairment s/p critical illness and prolonged hospitalization. Pt hospitalized in October 2020 with ARDS 2/2 COVID19 infection. Pt sustained a complex and lengthy hospitalization which included tracheostomy, prolonged sedation, ECMO. In this period pt sustained CVA and SDH. Pt has now been liberated from tracheostomy and G-tube. Pt has since been hospitalized for wound infection and UTI. Pt lives with parents at home, has hospital bed and left chair, hoyer lift transfers, and power WC for mobility needs. Pt needs heavy physical assistance with ADL 2/2 BUE contractures and motor dysfunction   PRECAUTIONS: Fall  SUBJECTIVE:  Patient reports he started coughing in the [place] on the way up here. States not feeling  too rough but requesting to just focus on flexibility today.  PAIN:  Are you having pain? No   TODAY'S TREATMENT:  - 05/01/23    THEREX:   PROM to BLE-  -ankle DF/PF/EV/IV- CW and CCW ankle circles -Knee flex/ext  -Hip flex/ext/abd/add/IR/ER and hip circles- CW/CCW- very limited with each LE today.   Seated hip march each LE  x 15 reps Seated LAQ each LE x 15 reps  Seated Hip add with ball squeeze x 15 reps  *Session stopped short due to patient fatigue and coughing.           PATIENT EDUCATION: Education  details: Exercise technique with VC   HOME EXERCISE PROGRAM:  Access Code: [email or web address] URL: [email or web address] Date: 03/23/2022 Prepared by: Maureen Ralphs  Exercises - Supine Bridge  - 3 x weekly - 3 sets - 10 reps - 2 hold - Supine Gluteal Sets  - 3 x weekly - 3 sets - 10 reps - 5 sec hold - Supine Quad Set  - 1 x daily - 3 x weekly - 3 sets - 10 reps - 5 hold - Seated Long Arc Quad  - 1 x daily - 7 x weekly - 3 sets - 10 reps - Seated Hip Adduction Squeeze with Ball  - 1 x daily - 3 x weekly - 3 sets - 10 reps - 5 hold - Seated Hip Abduction  - 1 x daily - 3 x weekly - 3 sets - 10 reps - 2 hold    PT Short Term Goals -       PT SHORT TERM GOAL #1   Title Pt will be independent with HEP in order to improve strength and balance in order to decrease fall risk and improve function at home and work.    Baseline 01/04/2021= No formal HEP in place; 12/12 no HEP in place; 05/10/2021-Patient and his father were able to report compliance with curent HEP consisting of mostly seated/reclined LE strengthening. Both verbalize no questions at this time.    Time 6    Period Weeks    Status Achieved    Target Date 02/15/21            PT LONG TERM GOAL #1     Title Patient will increase BLE gross strength by 1/2 muscle grade to improve functional strength for improved independence with potential gait, increased standing tolerance and increased ADL ability.     Baseline 01/04/2021- Patient presents with 1/5 to 3-/5 B LE strength with MMT; 12/12: goal partially met for Left knee/hip; 05/10/2021= 2-/5 bilateal Hip flex; 3+/5 bilateral Knee ext; 06/21/2021= Patient presents with 2-/5 bilateral Hip flex; 3+/5 bilateral knee ext/flex; 2-/5 left ankle DF; 0/5 right ankle- and able to increase reps and resistance with LE's. 09/15/2021- Patient technically presents with 2-/5 B hip flex/abd/add - but he is able to raise his hip up to approx 100 deg which has improved. 3+/5 Bilateral knee ext,  2-/5 left ankle and 0/5 right ankle.  12/08/2021= Patient able to lift left knee at 110 deg of hip flex; presents with 3+/5 knee ext, 2-/5 left ankle DF and 0/5 right ankle DF, 2-/5 bilateral Hip abd in seated position.    12/6: R: knee 3+/5 ext, 2/5 flexion, left knee 3+/5 extension, 3+/5 flexion, R hip: 2+/5 hip add, 2+/5 hip ABD L hip: 4-/5 hip ABD, 3+/5 hip ADD, 3+/5 hip flexion; 06/06/2022= Patient now presents with 2-/5 right ankle DF/PF;     Time 12  Period Weeks     Status MET    Target Date 03/07/2022         PT LONG TERM GOAL #2    Title Patient will tolerate sitting unsupported demonstrating erect sitting posture for 15 minutes with CGA to demonstrate improved back extensor strength and improved sitting tolerance.     Baseline 01/04/2021- Patient confied to sitting in lift chair or electric power chair with back support and unable to sit upright without physical assistance; 12/12: tolerates <1 minutes upright unsupported sitting. 05/10/2021=static sit with forward trunk lean  in his power wheelchair without back support x approx 3 min. 06/21/2021=Unable to assess today due to patient with acute back pain but on previous visit able to sit x 8 min without back support. 09/15/2021- on last visit- 09/13/2021- patient was able to sit unsupported x 8 min at edge of mat. 10/13/2023 - Patient was able to sit at edge of mat with varying level of assist today from SBA to min A for a total of 20 min. 12/13/2021= Patient demonstrated unsupported sitting at edge of mat for approx 20 min    Time 12     Period Weeks     Status GOAL MET    Target Date 12/08/21          PT LONG TERM GOAL #3    Title Patient will demonstrate ability to perform static standing in // bars > 2 min with Max Assist  without loss of balance and fair posture for improved overall strength for pre-gait and transfer activities.     Baseline 01/04/2021= Patient current uanble to stand- Dependent on hoyer or sit to stand lift for transfers.  05/10/2021=Not appropriate yet- Currently still dependent with all transfers using hoyer. 06/21/2021= Patient continuing now to focus on LE strengthening to prepare for standing-unable to try today due to acute low back pain-  planning on attempting in new cert period. 09/15/2021- Patient has attempted standing 2x in past two week- max Assist of 2 people - only once was he successful to clearing his bottom from chair - Will continue to be a focus during the new certification. 12/13/2021= Patient has been limited secondary to increased overall low back pain during this certification and will require more time to focus on this goal.  12/6: not assessed this date, will assess at date when 2-3 PTs are present for assistance     Time 12     Period Weeks     Status GOAL not appropriate at this time - may attempt in future once Patient presents with improved overall LE strength.                 PT LONG TERM GOAL #4    Title Pt will improve FOTO score by 10 points or more demonstrating improved perceived functional ability     Baseline FOTO 7 on 10/17; 03/15/21: FOTO 12; 05/10/2021 06/21/2021= 1; 09/15/2021= 9; 12/13/2021= Will issue next visit 12/6: 4; 06/06/2022= Will assess next visit; Goal not appropriate as patient is not ambulatory.     Time 12     Period Weeks     Status WITHDRAWN    Target date 11/30/2022         PT LONG TERM GOAL #5    Title Patient will perform sit to stand transfer with appropriate AD and max assist of 2 people with 75% consistency to prepare for pregait activities.     Baseline 09/15/2021= Patient unable to stand well- unable  to clear his bottom off chair with Max assist of 2 persons. 12/13/2021- Goal not appropriate to try yet but will keep and roll over to next cert as shift continues to focus on transfers/standing; 4/24= Patient able to perform active ankle DF/PF with right LE and able to raise his knee into seated march and clear floor without physical assist today - as previously unable  as well as lift right knee ext to near full ROM to improve strength for eventual standing. 09/07/2022- Goal continues to not be appropriate but patient is now standing some and will keep goal active; 11/23/2022= patient is now performing sit to stand with max A +2 and now able to clear his bottom off mat. 11/30/2022- Patient continues to perform sit to stand transfers with Max A+2 and able to clear bottom well off mat but not erect yet-     Time 12     Period Weeks     Status PROGRESSING    Target date  02/22/2023       PT LONG TERM GOAL #6  Title Patient will tolerate sitting unsupported demonstrating erect sitting posture for 30 minutes with CGA to demonstrate improved back extensor strength and improved sitting tolerance.   Baseline 12/13/2021= Patient demonstrated unsupported sitting at edge of mat for approx 20 min;  12/29/2021- Patient performed approx 30 min of dynamic sitting activities today. 06/06/2022= Patient demonstrated ability to sit and perform static and dynamic UE/LE movement with only Supervision.   Time 12   Period Weeks   Status GOAL MET           7.  Patient will tolerate 2 minutes or more of standing in sit to stand lift or with max assist +2 in order to indicate improved lower extremity weightbearing tolerance for progression to standing in parallel bars. Baseline: 1 minute on most recent stand 02/21/22; 06/06/2022- Patient did attempt today after complaining of right LE pain last week. He attempted 3 stands using sit to stand lift. - all over 1 min- last one approx 48 sec- stopped due to fatigue. More erect standing in lift today - still poor gluteal strength but able to activate glutes and extend hips upon command but unable to hold > 5 sec. 07/28/2022- Will attempt standing next visit but patient has been able to stand since last progress note for up to 3 min at a time at his best. 09/07/2022-  attempted standing with max +2 and patient able to stand 15-20 sec so will now  revise  goal; 11/23/2022- Patient at his best between last 2 visits was able to stand approx 50 sec with max A+2.  11/30/2022- Patient performed 4 trials of static standing - max A +2- clearing bottom and using forearms to bear weight  03/15/2023: Patient performed 2 trials of static standing - max A +2- clearing edge of mat and using forearms to bear weight for approximately 45 seconds, is able to come into approximately 45 degrees of knee flexion from sit to stand Goal status: PROGRESSING Target date: 02/22/2023   8. Patient will tolerate sitting unsupported demonstrating ability to perform dynamic UE/LE activities for 30 minutes  independently to demonstrate ability to sit at edge of bed to eat or perform some ADL's/exercise for optimal quality of life.             Baseline: 06/06/2022- Patient able to sit and perform some UE/LE exercises but requires CGA at times for safety- he is able to static sit for  30 min with supervision. 07/27/2022= Patient able to now sit > 30 min with Supervision only - performing static and dynamic activities. Will keep goal active to ensure patient is consistent. 09/07/2022=  Patient able to demonstrate consistent ability to sit at edge of mat during visits             Goal Status: MET             Target date: 08/29/2022        Plan     Clinical Impression Statement Treatment was limited today as patient arrived with beginning of cough which seemed to be persistent throughout session. He presented with increased overall tightness in each LE and weakness- recovering from recent boil on scrotum and illness which kept him out of PT all last week. He continues to try hard but very limited today due to weakness and coughing.   Pt will continue to benefit from skilled physical therapy intervention to address impairments, improve QOL, and attain therapy goals.    Personal Factors and Comorbidities Comorbidity 3+;Time since onset of injury/illness/exacerbation    Comorbidities CVA,  diabetes, Seizures    Examination-Activity Limitations Bathing;Bed Mobility;Bend;Caring for Others;Carry;Dressing;Hygiene/Grooming;Lift;Locomotion Level;Reach Overhead;Self Feeding;Sit;Squat;Stairs;Stand;Transfers;Toileting    Examination-Participation Restrictions Cleaning;Community Activity;Driving;Laundry;Medication Management;Meal Prep;Occupation;Personal Finances;Shop;Yard Work;Volunteer    Stability/Clinical Decision Making Evolving/Moderate complexity    Rehab Potential Fair    PT Frequency 2x / week    PT Duration 12 weeks    PT Treatment/Interventions ADLs/Self Care Home Management;Cryotherapy;Electrical Stimulation;Moist Heat;Ultrasound;DME Instruction;Gait training;Stair training;Functional mobility training;Therapeutic exercise;Balance training;Patient/family education;Orthotic Fit/Training;Neuromuscular re-education;Wheelchair mobility training;Manual techniques;Passive range of motion;Dry needling;Energy conservation;Taping;Visual/perceptual remediation/compensation;Joint Manipulations    PT Next Visit Plan core strength/motor control in short-sitting, sit to stand if able; Continue with progressive LE Strengthening. Stand activities as appropriate.     PT Home Exercise Plan No changes to HEP today    Consulted and Agree with Plan of Care Patient;Family member/caregiver    Family Member Consulted             03/09/2023, 8:04 AM      5:09 PM, 05/01/23 Louis Meckel, PT Physical Therapist - [facility]  Outpatient Physical Therapy- Main Campus [phone]

## 2023-05-03 ENCOUNTER — Ambulatory Visit: Payer: BC Managed Care – PPO

## 2023-05-03 ENCOUNTER — Ambulatory Visit: Payer: BC Managed Care – PPO | Admitting: Occupational Therapy

## 2023-05-08 ENCOUNTER — Ambulatory Visit: Payer: Medicare Other | Admitting: Occupational Therapy

## 2023-05-08 ENCOUNTER — Ambulatory Visit: Payer: Medicare Other

## 2023-05-08 NOTE — Therapy (Incomplete)
OUTPATIENT PHYSICAL THERAPY TREATMENT      Patient Name: Paul Hall MRN: [id number] DOB:[birth year], 28 y.o., male Today's Date: 05/08/2023  PCP:  Cindi Carbon PROVIDER:  Lenise Herald, PA-C                 Past Medical History:  Diagnosis Date   Diabetes mellitus (HCC)    Hypertension    Stroke [facility])    Past Surgical History:  Procedure Laterality Date   IVC FILTER PLACEMENT ([facility] HX)     LEG SURGERY     PEG TUBE PLACEMENT     TRACHEOSTOMY     Patient Active Problem List   Diagnosis Date Noted   Sepsis due to vancomycin resistant Enterococcus species (HCC) 06/06/2019   SIRS (systemic inflammatory response syndrome) (HCC) 06/05/2019   Acute lower UTI 06/05/2019   VRE (vancomycin-resistant Enterococci) infection 06/05/2019   Anemia 06/05/2019   Skin ulcer of sacrum with necrosis of muscle (HCC)    Urinary retention    Type 2 diabetes mellitus without complication, with long-term current use of insulin (HCC)    Tachycardia    Lower extremity edema    Acute metabolic encephalopathy    Obstructive sleep apnea    Morbid obesity with BMI of 60.0-69.9, adult (HCC)    Goals of care, counseling/discussion    Palliative care encounter    Sepsis (HCC) 04/27/2019   H/O insulin dependent diabetes mellitus 04/27/2019   History of CVA with residual deficit 04/27/2019   Seizure disorder (HCC) 04/27/2019   Decubitus ulcer of sacral region, stage 4 (HCC) 04/27/2019    REFERRING DIAG: Cerebral infarction, unspecified   THERAPY DIAG:  No diagnosis found.  Rationale for Evaluation and Treatment Rehabilitation  PERTINENT HISTORY: Paul Hall is a 26yoM who presents with severe weakness, quadriparesis, altered sensorium, and visual impairment s/p critical illness and prolonged hospitalization. Pt hospitalized in October 2020 with ARDS 2/2 COVID19 infection. Pt sustained a complex and lengthy hospitalization which included tracheostomy, prolonged sedation,  ECMO. In this period pt sustained CVA and SDH. Pt has now been liberated from tracheostomy and G-tube. Pt has since been hospitalized for wound infection and UTI. Pt lives with parents at home, has hospital bed and left chair, hoyer lift transfers, and power WC for mobility needs. Pt needs heavy physical assistance with ADL 2/2 BUE contractures and motor dysfunction   PRECAUTIONS: Fall  SUBJECTIVE:  ***    PAIN:  Are you having pain? No   TODAY'S TREATMENT:  - 05/08/23    THEREX:   PROM to BLE-  -ankle DF/PF/EV/IV- CW and CCW ankle circles -Knee flex/ext  -Hip flex/ext/abd/add/IR/ER and hip circles- CW/CCW- very limited with each LE today.   Seated hip march each LE  x 15 reps Seated LAQ each LE x 15 reps  Seated Hip add with ball squeeze x 15 reps  *Session stopped short due to patient fatigue and coughing.           PATIENT EDUCATION: Education details: Exercise technique with VC   HOME EXERCISE PROGRAM:  Access Code: [email or web address] URL: [email or web address] Date: 03/23/2022 Prepared by: Maureen Ralphs  Exercises - Supine Bridge  - 3 x weekly - 3 sets - 10 reps - 2 hold - Supine Gluteal Sets  - 3 x weekly - 3 sets - 10 reps - 5 sec hold - Supine Quad Set  - 1 x daily - 3 x weekly - 3 sets - 10 reps - 5 hold - Seated Long Arc Quad  -  1 x daily - 7 x weekly - 3 sets - 10 reps - Seated Hip Adduction Squeeze with Ball  - 1 x daily - 3 x weekly - 3 sets - 10 reps - 5 hold - Seated Hip Abduction  - 1 x daily - 3 x weekly - 3 sets - 10 reps - 2 hold    PT Short Term Goals -       PT SHORT TERM GOAL #1   Title Pt will be independent with HEP in order to improve strength and balance in order to decrease fall risk and improve function at home and work.    Baseline 01/04/2021= No formal HEP in place; 12/12 no HEP in place; 05/10/2021-Patient and his father were able to report compliance with curent HEP consisting of mostly seated/reclined LE strengthening.  Both verbalize no questions at this time.    Time 6    Period Weeks    Status Achieved    Target Date 02/15/21            PT LONG TERM GOAL #1     Title Patient will increase BLE gross strength by 1/2 muscle grade to improve functional strength for improved independence with potential gait, increased standing tolerance and increased ADL ability.     Baseline 01/04/2021- Patient presents with 1/5 to 3-/5 B LE strength with MMT; 12/12: goal partially met for Left knee/hip; 05/10/2021= 2-/5 bilateal Hip flex; 3+/5 bilateral Knee ext; 06/21/2021= Patient presents with 2-/5 bilateral Hip flex; 3+/5 bilateral knee ext/flex; 2-/5 left ankle DF; 0/5 right ankle- and able to increase reps and resistance with LE's. 09/15/2021- Patient technically presents with 2-/5 B hip flex/abd/add - but he is able to raise his hip up to approx 100 deg which has improved. 3+/5 Bilateral knee ext, 2-/5 left ankle and 0/5 right ankle.  12/08/2021= Patient able to lift left knee at 110 deg of hip flex; presents with 3+/5 knee ext, 2-/5 left ankle DF and 0/5 right ankle DF, 2-/5 bilateral Hip abd in seated position.    12/6: R: knee 3+/5 ext, 2/5 flexion, left knee 3+/5 extension, 3+/5 flexion, R hip: 2+/5 hip add, 2+/5 hip ABD L hip: 4-/5 hip ABD, 3+/5 hip ADD, 3+/5 hip flexion; 06/06/2022= Patient now presents with 2-/5 right ankle DF/PF;     Time 12     Period Weeks     Status MET    Target Date 03/07/2022         PT LONG TERM GOAL #2    Title Patient will tolerate sitting unsupported demonstrating erect sitting posture for 15 minutes with CGA to demonstrate improved back extensor strength and improved sitting tolerance.     Baseline 01/04/2021- Patient confied to sitting in lift chair or electric power chair with back support and unable to sit upright without physical assistance; 12/12: tolerates <1 minutes upright unsupported sitting. 05/10/2021=static sit with forward trunk lean  in his power wheelchair without back support x  approx 3 min. 06/21/2021=Unable to assess today due to patient with acute back pain but on previous visit able to sit x 8 min without back support. 09/15/2021- on last visit- 09/13/2021- patient was able to sit unsupported x 8 min at edge of mat. 10/13/2023 - Patient was able to sit at edge of mat with varying level of assist today from SBA to min A for a total of 20 min. 12/13/2021= Patient demonstrated unsupported sitting at edge of mat for approx 20 min  Time 12     Period Weeks     Status GOAL MET    Target Date 12/08/21          PT LONG TERM GOAL #3    Title Patient will demonstrate ability to perform static standing in // bars > 2 min with Max Assist  without loss of balance and fair posture for improved overall strength for pre-gait and transfer activities.     Baseline 01/04/2021= Patient current uanble to stand- Dependent on hoyer or sit to stand lift for transfers. 05/10/2021=Not appropriate yet- Currently still dependent with all transfers using hoyer. 06/21/2021= Patient continuing now to focus on LE strengthening to prepare for standing-unable to try today due to acute low back pain-  planning on attempting in new cert period. 09/15/2021- Patient has attempted standing 2x in past two week- max Assist of 2 people - only once was he successful to clearing his bottom from chair - Will continue to be a focus during the new certification. 12/13/2021= Patient has been limited secondary to increased overall low back pain during this certification and will require more time to focus on this goal.  12/6: not assessed this date, will assess at date when 2-3 PTs are present for assistance     Time 12     Period Weeks     Status GOAL not appropriate at this time - may attempt in future once Patient presents with improved overall LE strength.                 PT LONG TERM GOAL #4    Title Pt will improve FOTO score by 10 points or more demonstrating improved perceived functional ability     Baseline FOTO 7 on  10/17; 03/15/21: FOTO 12; 05/10/2021 06/21/2021= 1; 09/15/2021= 9; 12/13/2021= Will issue next visit 12/6: 4; 06/06/2022= Will assess next visit; Goal not appropriate as patient is not ambulatory.     Time 12     Period Weeks     Status WITHDRAWN    Target date 11/30/2022         PT LONG TERM GOAL #5    Title Patient will perform sit to stand transfer with appropriate AD and max assist of 2 people with 75% consistency to prepare for pregait activities.     Baseline 09/15/2021= Patient unable to stand well- unable to clear his bottom off chair with Max assist of 2 persons. 12/13/2021- Goal not appropriate to try yet but will keep and roll over to next cert as shift continues to focus on transfers/standing; 4/24= Patient able to perform active ankle DF/PF with right LE and able to raise his knee into seated march and clear floor without physical assist today - as previously unable as well as lift right knee ext to near full ROM to improve strength for eventual standing. 09/07/2022- Goal continues to not be appropriate but patient is now standing some and will keep goal active; 11/23/2022= patient is now performing sit to stand with max A +2 and now able to clear his bottom off mat. 11/30/2022- Patient continues to perform sit to stand transfers with Max A+2 and able to clear bottom well off mat but not erect yet-     Time 12     Period Weeks     Status PROGRESSING    Target date  02/22/2023       PT LONG TERM GOAL #6  Title Patient will tolerate sitting unsupported demonstrating erect sitting posture for  30 minutes with CGA to demonstrate improved back extensor strength and improved sitting tolerance.   Baseline 12/13/2021= Patient demonstrated unsupported sitting at edge of mat for approx 20 min;  12/29/2021- Patient performed approx 30 min of dynamic sitting activities today. 06/06/2022= Patient demonstrated ability to sit and perform static and dynamic UE/LE movement with only Supervision.   Time 12   Period Weeks    Status GOAL MET           7.  Patient will tolerate 2 minutes or more of standing in sit to stand lift or with max assist +2 in order to indicate improved lower extremity weightbearing tolerance for progression to standing in parallel bars. Baseline: 1 minute on most recent stand 02/21/22; 06/06/2022- Patient did attempt today after complaining of right LE pain last week. He attempted 3 stands using sit to stand lift. - all over 1 min- last one approx 48 sec- stopped due to fatigue. More erect standing in lift today - still poor gluteal strength but able to activate glutes and extend hips upon command but unable to hold > 5 sec. 07/28/2022- Will attempt standing next visit but patient has been able to stand since last progress note for up to 3 min at a time at his best. 09/07/2022-  attempted standing with max +2 and patient able to stand 15-20 sec so will now  revise goal; 11/23/2022- Patient at his best between last 2 visits was able to stand approx 50 sec with max A+2.  11/30/2022- Patient performed 4 trials of static standing - max A +2- clearing bottom and using forearms to bear weight  03/15/2023: Patient performed 2 trials of static standing - max A +2- clearing edge of mat and using forearms to bear weight for approximately 45 seconds, is able to come into approximately 45 degrees of knee flexion from sit to stand Goal status: PROGRESSING Target date: 02/22/2023   8. Patient will tolerate sitting unsupported demonstrating ability to perform dynamic UE/LE activities for 30 minutes  independently to demonstrate ability to sit at edge of bed to eat or perform some ADL's/exercise for optimal quality of life.             Baseline: 06/06/2022- Patient able to sit and perform some UE/LE exercises but requires CGA at times for safety- he is able to static sit for 30 min with supervision. 07/27/2022= Patient able to now sit > 30 min with Supervision only - performing static and dynamic activities. Will keep  goal active to ensure patient is consistent. 09/07/2022=  Patient able to demonstrate consistent ability to sit at edge of mat during visits             Goal Status: MET             Target date: 08/29/2022        Plan     Clinical Impression Statement Treatment was limited today as patient arrived with beginning of cough which seemed to be persistent throughout session. He presented with increased overall tightness in each LE and weakness- recovering from recent boil on scrotum and illness which kept him out of PT all last week. He continues to try hard but very limited today due to weakness and coughing.   Pt will continue to benefit from skilled physical therapy intervention to address impairments, improve QOL, and attain therapy goals.    Personal Factors and Comorbidities Comorbidity 3+;Time since onset of injury/illness/exacerbation    Comorbidities CVA, diabetes, Seizures  Examination-Activity Limitations Bathing;Bed Mobility;Bend;Caring for Others;Carry;Dressing;Hygiene/Grooming;Lift;Locomotion Level;Reach Overhead;Self Feeding;Sit;Squat;Stairs;Stand;Transfers;Toileting    Examination-Participation Restrictions Cleaning;Community Activity;Driving;Laundry;Medication Management;Meal Prep;Occupation;Personal Finances;Shop;Yard Work;Volunteer    Stability/Clinical Decision Making Evolving/Moderate complexity    Rehab Potential Fair    PT Frequency 2x / week    PT Duration 12 weeks    PT Treatment/Interventions ADLs/Self Care Home Management;Cryotherapy;Electrical Stimulation;Moist Heat;Ultrasound;DME Instruction;Gait training;Stair training;Functional mobility training;Therapeutic exercise;Balance training;Patient/family education;Orthotic Fit/Training;Neuromuscular re-education;Wheelchair mobility training;Manual techniques;Passive range of motion;Dry needling;Energy conservation;Taping;Visual/perceptual remediation/compensation;Joint Manipulations    PT Next Visit Plan core strength/motor  control in short-sitting, sit to stand if able; Continue with progressive LE Strengthening. Stand activities as appropriate.     PT Home Exercise Plan No changes to HEP today    Consulted and Agree with Plan of Care Patient;Family member/caregiver    Family Member Consulted             03/09/2023, 8:04 AM      3:30 PM, 05/08/23 Louis Meckel, PT Physical Therapist - [facility]  Outpatient Physical Therapy- Main Campus [phone]

## 2023-05-10 ENCOUNTER — Ambulatory Visit: Payer: Medicare Other | Attending: Physician Assistant | Admitting: Physical Therapy

## 2023-05-10 ENCOUNTER — Ambulatory Visit: Payer: Medicare Other | Admitting: Occupational Therapy

## 2023-05-10 DIAGNOSIS — R269 Unspecified abnormalities of gait and mobility: Secondary | ICD-10-CM | POA: Diagnosis not present

## 2023-05-10 DIAGNOSIS — R2689 Other abnormalities of gait and mobility: Secondary | ICD-10-CM

## 2023-05-10 DIAGNOSIS — R2681 Unsteadiness on feet: Secondary | ICD-10-CM

## 2023-05-10 DIAGNOSIS — I6930 Unspecified sequelae of cerebral infarction: Secondary | ICD-10-CM | POA: Diagnosis not present

## 2023-05-10 DIAGNOSIS — R262 Difficulty in walking, not elsewhere classified: Secondary | ICD-10-CM | POA: Diagnosis not present

## 2023-05-10 DIAGNOSIS — M6281 Muscle weakness (generalized): Secondary | ICD-10-CM

## 2023-05-10 DIAGNOSIS — R278 Other lack of coordination: Secondary | ICD-10-CM | POA: Diagnosis not present

## 2023-05-10 NOTE — Therapy (Signed)
 OUTPATIENT PHYSICAL THERAPY TREATMENT      Patient Name: Paul Hall MRN: [id number] DOB:[birth year], 28 y.o., male Today's Date: 05/10/2023  PCP:  [facility] PROVIDER:  Morene Sous, PA-C   PT End of Session - 05/10/23 1732     Visit Number 146    Number of Visits 160   date corrected from most recent recert   Date for PT Re-Evaluation 05/14/22   Date corrected from most recent recert   Authorization Type [employer] [place] [employer]    Authorization Time Period 01/04/21-03/29/21; Recert 03/24/2021-06/16/2021; Recert 09/15/2021- 12/08/2021; Recert 12/13/2021-03/07/2022    Progress Note Due on Visit 150    PT Start Time 1542    PT Stop Time 1615    PT Time Calculation (min) 33 min    Equipment Utilized During Treatment Gait belt    Activity Tolerance Patient tolerated treatment well    Behavior During Therapy WFL for tasks assessed/performed                          Past Medical History:  Diagnosis Date   Diabetes mellitus (HCC)    Hypertension    Stroke (HCC)    Past Surgical History:  Procedure Laterality Date   IVC FILTER PLACEMENT ([facility] HX)     LEG SURGERY     PEG TUBE PLACEMENT     TRACHEOSTOMY     Patient Active Problem List   Diagnosis Date Noted   Sepsis due to vancomycin  resistant Enterococcus species (HCC) 06/06/2019   SIRS (systemic inflammatory response syndrome) (HCC) 06/05/2019   Acute lower UTI 06/05/2019   VRE (vancomycin -resistant Enterococci) infection 06/05/2019   Anemia 06/05/2019   Skin ulcer of sacrum with necrosis of muscle (HCC)    Urinary retention    Type 2 diabetes mellitus without complication, with long-term current use of insulin  (HCC)    Tachycardia    Lower extremity edema    Acute metabolic encephalopathy    Obstructive sleep apnea    Morbid obesity with BMI of 60.0-69.9, adult (HCC)    Goals of care, counseling/discussion    Palliative care encounter    Sepsis (HCC) 04/27/2019   H/O insulin  dependent  diabetes mellitus 04/27/2019   History of CVA with residual deficit 04/27/2019   Seizure disorder (HCC) 04/27/2019   Decubitus ulcer of sacral region, stage 4 (HCC) 04/27/2019    REFERRING DIAG: Cerebral infarction, unspecified   THERAPY DIAG:  Muscle weakness (generalized)  Other lack of coordination  Difficulty in walking, not elsewhere classified  Unsteadiness on feet  Abnormality of gait and mobility  Other abnormalities of gait and mobility  Rationale for Evaluation and Treatment Rehabilitation  PERTINENT HISTORY: Paul Hall is a 26yoM who presents with severe weakness, quadriparesis, altered sensorium, and visual impairment s/p critical illness and prolonged hospitalization. Pt hospitalized in October 2020 with ARDS 2/2 COVID19 infection. Pt sustained a complex and lengthy hospitalization which included tracheostomy, prolonged sedation, ECMO. In this period pt sustained CVA and SDH. Pt has now been liberated from tracheostomy and G-tube. Pt has since been hospitalized for wound infection and UTI. Pt lives with parents at home, has hospital bed and left chair, hoyer lift transfers, and power WC for mobility needs. Pt needs heavy physical assistance with ADL 2/2 BUE contractures and motor dysfunction   PRECAUTIONS: Fall  SUBJECTIVE:  Patient reports that he is feeling much better. States that cough lasted for roughly 2-3 days. Mother reports that he has not  been stretched since PT 1.5 weeks ago  PAIN:  Are you having pain? No. But feeling tight.    TODAY'S TREATMENT:  - 05/10/23    Maximove transfer to bed.  Hip flexor stretch in supine with BLE off EOB  Supine>sit with total A +2   Sitting balance with head turns.  Sitting balance with head nod.   UE bicep stretch  LAQ and HS curl  Ankle PF/DF   Maximove transfer back to WC.       PATIENT EDUCATION: Education details: Exercise technique with VC   HOME EXERCISE PROGRAM:  Access Code: [email or web address] URL:  [email or web address] Date: 03/23/2022 Prepared by: Reyes London  Exercises - Supine Bridge  - 3 x weekly - 3 sets - 10 reps - 2 hold - Supine Gluteal Sets  - 3 x weekly - 3 sets - 10 reps - 5 sec hold - Supine Quad Set  - 1 x daily - 3 x weekly - 3 sets - 10 reps - 5 hold - Seated Long Arc Quad  - 1 x daily - 7 x weekly - 3 sets - 10 reps - Seated Hip Adduction Squeeze with Ball  - 1 x daily - 3 x weekly - 3 sets - 10 reps - 5 hold - Seated Hip Abduction  - 1 x daily - 3 x weekly - 3 sets - 10 reps - 2 hold    PT Short Term Goals -       PT SHORT TERM GOAL #1   Title Pt will be independent with HEP in order to improve strength and balance in order to decrease fall risk and improve function at home and work.    Baseline 01/04/2021= No formal HEP in place; 12/12 no HEP in place; 05/10/2021-Patient and his father were able to report compliance with curent HEP consisting of mostly seated/reclined LE strengthening. Both verbalize no questions at this time.    Time 6    Period Weeks    Status Achieved    Target Date 02/15/21            PT LONG TERM GOAL #1     Title Patient will increase BLE gross strength by 1/2 muscle grade to improve functional strength for improved independence with potential gait, increased standing tolerance and increased ADL ability.     Baseline 01/04/2021- Patient presents with 1/5 to 3-/5 B LE strength with MMT; 12/12: goal partially met for Left knee/hip; 05/10/2021= 2-/5 bilateal Hip flex; 3+/5 bilateral Knee ext; 06/21/2021= Patient presents with 2-/5 bilateral Hip flex; 3+/5 bilateral knee ext/flex; 2-/5 left ankle DF; 0/5 right ankle- and able to increase reps and resistance with LE's. 09/15/2021- Patient technically presents with 2-/5 B hip flex/abd/add - but he is able to raise his hip up to approx 100 deg which has improved. 3+/5 Bilateral knee ext, 2-/5 left ankle and 0/5 right ankle.  12/08/2021= Patient able to lift left knee at 110 deg of  hip flex; presents with 3+/5 knee ext, 2-/5 left ankle DF and 0/5 right ankle DF, 2-/5 bilateral Hip abd in seated position.    12/6: R: knee 3+/5 ext, 2/5 flexion, left knee 3+/5 extension, 3+/5 flexion, R hip: 2+/5 hip add, 2+/5 hip ABD L hip: 4-/5 hip ABD, 3+/5 hip ADD, 3+/5 hip flexion; 06/06/2022= Patient now presents with 2-/5 right ankle DF/PF;     Time 12     Period Weeks     Status MET    Target Date 03/07/2022  PT LONG TERM GOAL #2    Title Patient will tolerate sitting unsupported demonstrating erect sitting posture for 15 minutes with CGA to demonstrate improved back extensor strength and improved sitting tolerance.     Baseline 01/04/2021- Patient confied to sitting in lift chair or electric power chair with back support and unable to sit upright without physical assistance; 12/12: tolerates <1 minutes upright unsupported sitting. 05/10/2021=static sit with forward trunk lean  in his power wheelchair without back support x approx 3 min. 06/21/2021=Unable to assess today due to patient with acute back pain but on previous visit able to sit x 8 min without back support. 09/15/2021- on last visit- 09/13/2021- patient was able to sit unsupported x 8 min at edge of mat. 10/13/2023 - Patient was able to sit at edge of mat with varying level of assist today from SBA to min A for a total of 20 min. 12/13/2021= Patient demonstrated unsupported sitting at edge of mat for approx 20 min    Time 12     Period Weeks     Status GOAL MET    Target Date 12/08/21          PT LONG TERM GOAL #3    Title Patient will demonstrate ability to perform static standing in // bars > 2 min with Max Assist  without loss of balance and fair posture for improved overall strength for pre-gait and transfer activities.     Baseline 01/04/2021= Patient current uanble to stand- Dependent on hoyer or sit to stand lift for transfers. 05/10/2021=Not appropriate yet- Currently still dependent with all transfers using hoyer.  06/21/2021= Patient continuing now to focus on LE strengthening to prepare for standing-unable to try today due to acute low back pain-  planning on attempting in new cert period. 09/15/2021- Patient has attempted standing 2x in past two week- max Assist of 2 Hall - only once was he successful to clearing his bottom from chair - Will continue to be a focus during the new certification. 12/13/2021= Patient has been limited secondary to increased overall low back pain during this certification and will require more time to focus on this goal.  12/6: not assessed this date, will assess at date when 2-3 PTs are present for assistance     Time 12     Period Weeks     Status GOAL not appropriate at this time - may attempt in future once Patient presents with improved overall LE strength.                 PT LONG TERM GOAL #4    Title Pt will improve FOTO score by 10 points or more demonstrating improved perceived functional ability     Baseline FOTO 7 on 10/17; 03/15/21: FOTO 12; 05/10/2021 06/21/2021= 1; 09/15/2021= 9; 12/13/2021= Will issue next visit 12/6: 4; 06/06/2022= Will assess next visit; Goal not appropriate as patient is not ambulatory.     Time 12     Period Weeks     Status WITHDRAWN    Target date 11/30/2022         PT LONG TERM GOAL #5    Title Patient will perform sit to stand transfer with appropriate AD and max assist of 2 Hall with 75% consistency to prepare for pregait activities.     Baseline 09/15/2021= Patient unable to stand well- unable to clear his bottom off chair with Max assist of 2 persons. 12/13/2021- Goal not appropriate to try yet but will keep  and roll over to next cert as shift continues to focus on transfers/standing; 4/24= Patient able to perform active ankle DF/PF with right LE and able to raise his knee into seated march and clear floor without physical assist today - as previously unable as well as lift right knee ext to near full ROM to improve strength for eventual  standing. 09/07/2022- Goal continues to not be appropriate but patient is now standing some and will keep goal active; 11/23/2022= patient is now performing sit to stand with max A +2 and now able to clear his bottom off mat. 11/30/2022- Patient continues to perform sit to stand transfers with Max A+2 and able to clear bottom well off mat but not erect yet-     Time 12     Period Weeks     Status PROGRESSING    Target date  02/22/2023       PT LONG TERM GOAL #6  Title Patient will tolerate sitting unsupported demonstrating erect sitting posture for 30 minutes with CGA to demonstrate improved back extensor strength and improved sitting tolerance.   Baseline 12/13/2021= Patient demonstrated unsupported sitting at edge of mat for approx 20 min;  12/29/2021- Patient performed approx 30 min of dynamic sitting activities today. 06/06/2022= Patient demonstrated ability to sit and perform static and dynamic UE/LE movement with only Supervision.   Time 12   Period Weeks   Status GOAL MET           7.  Patient will tolerate 2 minutes or more of standing in sit to stand lift or with max assist +2 in order to indicate improved lower extremity weightbearing tolerance for progression to standing in parallel bars. Baseline: 1 minute on most recent stand 02/21/22; 06/06/2022- Patient did attempt today after complaining of right LE pain last week. He attempted 3 stands using sit to stand lift. - all over 1 min- last one approx 48 sec- stopped due to fatigue. More erect standing in lift today - still poor gluteal strength but able to activate glutes and extend hips upon command but unable to hold > 5 sec. 07/28/2022- Will attempt standing next visit but patient has been able to stand since last progress note for up to 3 min at a time at his best. 09/07/2022-  attempted standing with max +2 and patient able to stand 15-20 sec so will now  revise goal; 11/23/2022- Patient at his best between last 2 visits was able to stand  approx 50 sec with max A+2.  11/30/2022- Patient performed 4 trials of static standing - max A +2- clearing bottom and using forearms to bear weight  03/15/2023: Patient performed 2 trials of static standing - max A +2- clearing edge of mat and using forearms to bear weight for approximately 45 seconds, is able to come into approximately 45 degrees of knee flexion from sit to stand Goal status: PROGRESSING Target date: 02/22/2023   8. Patient will tolerate sitting unsupported demonstrating ability to perform dynamic UE/LE activities for 30 minutes  independently to demonstrate ability to sit at edge of bed to eat or perform some ADL's/exercise for optimal quality of life.             Baseline: 06/06/2022- Patient able to sit and perform some UE/LE exercises but requires CGA at times for safety- he is able to static sit for 30 min with supervision. 07/27/2022= Patient able to now sit > 30 min with Supervision only - performing static and dynamic activities.  Will keep goal active to ensure patient is consistent. 09/07/2022=  Patient able to demonstrate consistent ability to sit at edge of mat during visits             Goal Status: MET             Target date: 08/29/2022        Plan     Clinical Impression Statement Treatment was limited today as patient arrived with beginning of cough which seemed to be persistent throughout session. He presented with increased overall tightness in each LE and weakness- recovering from recent boil on scrotum and illness which kept him out of PT all last week. He continues to try hard but very limited today due to weakness and coughing.   Pt will continue to benefit from skilled physical therapy intervention to address impairments, improve QOL, and attain therapy goals.    Personal Factors and Comorbidities Comorbidity 3+;Time since onset of injury/illness/exacerbation    Comorbidities CVA, diabetes, Seizures    Examination-Activity Limitations Bathing;Bed  Mobility;Bend;Caring for Others;Carry;Dressing;Hygiene/Grooming;Lift;Locomotion Level;Reach Overhead;Self Feeding;Sit;Squat;Stairs;Stand;Transfers;Toileting    Examination-Participation Restrictions Cleaning;Community Activity;Driving;Laundry;Medication Management;Meal Prep;Occupation;Personal Finances;Shop;Yard Work;Volunteer    Stability/Clinical Decision Making Evolving/Moderate complexity    Rehab Potential Fair    PT Frequency 2x / week    PT Duration 12 weeks    PT Treatment/Interventions ADLs/Self Care Home Management;Cryotherapy;Electrical Stimulation;Moist Heat;Ultrasound;DME Instruction;Gait training;Stair training;Functional mobility training;Therapeutic exercise;Balance training;Patient/family education;Orthotic Fit/Training;Neuromuscular re-education;Wheelchair mobility training;Manual techniques;Passive range of motion;Dry needling;Energy conservation;Taping;Visual/perceptual remediation/compensation;Joint Manipulations    PT Next Visit Plan core strength/motor control in short-sitting, sit to stand if able; Continue with progressive LE Strengthening. Stand activities as appropriate.     PT Home Exercise Plan No changes to HEP today    Consulted and Agree with Plan of Care Patient;Family member/caregiver    Family Member Consulted             03/09/2023, 8:04 AM      5:35 PM, 05/10/23 Massie FORBES Dollar PT, DPT  Physical Therapist - [facility]  5:39 PM 05/10/23

## 2023-05-10 NOTE — Therapy (Signed)
 Occupational Therapy Neuro Recertification Note   Patient Name: Paul Hall MRN: [id number] DOB:1995/08/28, 28 y.o., male Today's Date: 05/10/2023  PCP: Dr. Bertell REFERRING PROVIDER: Dr. Bertell   OT End of Session - 05/10/23 1626     Visit Number [id number]    Number of Visits 132    Date for OT Re-Evaluation 08/02/2023   OT Start Time 1615    OT Stop Time 1700    OT Time Calculation (min) 45 min    Activity Tolerance Patient tolerated treatment well    Behavior During Therapy WFL for tasks assessed/performed                Past Medical History:  Diagnosis Date   Diabetes mellitus (HCC)    Hypertension    Stroke [facility])    Past Surgical History:  Procedure Laterality Date   IVC FILTER PLACEMENT ([facility] HX)     LEG SURGERY     PEG TUBE PLACEMENT     TRACHEOSTOMY     Patient Active Problem List   Diagnosis Date Noted   Sepsis due to vancomycin  resistant Enterococcus species (HCC) 06/06/2019   SIRS (systemic inflammatory response syndrome) (HCC) 06/05/2019   Acute lower UTI 06/05/2019   VRE (vancomycin -resistant Enterococci) infection 06/05/2019   Anemia 06/05/2019   Skin ulcer of sacrum with necrosis of muscle (HCC)    Urinary retention    Type 2 diabetes mellitus without complication, with long-term current use of insulin  (HCC)    Tachycardia    Lower extremity edema    Acute metabolic encephalopathy    Obstructive sleep apnea    Morbid obesity with BMI of 60.0-69.9, adult (HCC)    Goals of care, counseling/discussion    Palliative care encounter    Sepsis (HCC) 04/27/2019   H/O insulin  dependent diabetes mellitus 04/27/2019   History of CVA with residual deficit 04/27/2019   Seizure disorder (HCC) 04/27/2019   Decubitus ulcer of sacral region, stage 4 (HCC) 04/27/2019   ONSET DATE: 01/2019  REFERRING DIAG: CVA/COVID-19  THERAPY DIAG:  Muscle weakness (generalized)  Rationale for Evaluation and Treatment Rehabilitation  SUBJECTIVE:   SUBJECTIVE  STATEMENT:  Pt  reports  having been sick, and continues to have residual congestion.  Pt accompanied by: self and family member  PERTINENT HISTORY:  Pt. is a 28 y.o. male who was diagnosed with COVID-19, and CVA with resultant quadriplegia. Pt. Was then hospitalized with VRE UTI. PMHx includes: urinary retention, seizure disorder, obstructive sleep disorder, DM Type II, Morbid obesity.   PRECAUTIONS: None  WEIGHT BEARING RESTRICTIONS No  PAIN:  No reports of pain Are you having pain? Reports right  wrist tightness.  FALLS: Has patient fallen in last 6 months? No  LIVING ENVIRONMENT: Lives with: lives with their family Lives in: House/apartment Stairs: No Level Entry Has following equipment at home: Wheelchair (power) and hoyer lift, sit to stand lift  PLOF: Independent  PATIENT GOALS: To be able to engage in more daily care tasks.  OBJECTIVE:   HAND DOMINANCE: Right  ADLs:  Transfers/ambulation related to ADLs: Eating: Pt. reports being able to hold standard utensils, and is starting to engage more in self-feeding tasks, hand to mouth patterns. Pt. Reports that he does as much as he can with the task, and family assists  with the remainder of the task. Grooming: Pt. Is able to initiate holding an electric toothbrush, and brush his teeth. Family assists LB Dressing: Total Assist UE dressing: Pt. is now able to reach  up to actively assist with grasping , and pulling his gown down. Toileting:  Total Assist Bathing: MaxA UB, Total assist LB Tub Shower transfers: N/A Equipment: See above    IADLs: Shopping: Relies on family to assist Light housekeeping: Total Assist Meal Prep: Total Assist Community mobility:   Medication management:  Total Assist  Financial management: N/A Handwriting: Not legible: Pt. Is able to hold a pen with the left hand, and initiate marking the page. Pt.'s eye glasses were not available  MOBILITY STATUS:  Power w/c  POSTURE COMMENTS:  Pt.  Requires position changes in his power w/c  ACTIVITY TOLERANCE: Activity tolerance:  Fair  FUNCTIONAL OUTCOME MEASURES: FOTO: 40  TR score: 45  05/25/2022:   FOTO: 50 TR score: 45  07/20/22: FOTO 55  03/08/23: 51  UPPER EXTREMITY ROM     Active ROM Left eval Left  03/21/2022 Left 05/25/2022 L  07/20/22 Left 07/25/2022 Left  08/15/2022 Left  09/26/2022 Left  11/16/2022 Left 12/28/2022 Left 03/13/23 Left 05/10/23  Shoulder flexion 119 123 125 130  136 138 130 142 144 144  Shoulder abduction 110 115 115 115  118 121 125 142 140 140  Shoulder adduction             Shoulder extension             Shoulder internal rotation             Shoulder external rotation             Elbow flexion 135(135) 145 145 145  145    145 145  Elbow extension -27(-18) -21(-20) -20(-16)  -25(-10) -23(-10) -21(-10) -20(-18) -20(-18) -20(-17) -17(-16)  Wrist flexion             Wrist extension 10(50) 19(50) 28(50)  30(50) 35(55) 36(55) 36(60) 40(60) 45(60) 45(60)  Wrist ulnar deviation             Wrist radial deviation             Wrist pronation             Wrist supination   Limited by flexor tone       10(15)   (Blank rows = not tested)    Active ROM Right eval Right 03/21/2022 Right 05/25/2022 R  07/20/22 Right  07/25/22 Right 08/15/2022 Right 09/26/2022 Right 11/16/2022 Right 12/28/2022 Right 03/13/23 Right 05/10/23  Shoulder flexion 106 scaption 105  Scaption 62 flexion 110 scaption 100  105 105 105 112 Scaption 123 Scaption 122  Scaption  Shoulder abduction 114 90 97 100  105 117 120 124 124 122  Shoulder adduction             Shoulder extension             Shoulder internal rotation             Shoulder external rotation             Elbow flexion 120(130) 145 145 140  144 140 142 148 148 145  Elbow extension -45(-35) -22(-35) -28(-22)  -32(-21) -32(-28) -28(-26) -28(-28) -27(-26) -27(-40) -38(-35)  Wrist flexion             Wrist extension -30(10) -25(10) -10(30)  -10(20) -10(25)  -20(40) -30(10) -22(34) -22(35) -32(22)  Wrist ulnar deviation             Wrist radial deviation             Wrist pronation  Wrist supination   Limited by flexor tone            12/28/2022: Thumb radial abduction: Right: 12(46), Left: 40(54)  Digits: Bilateral 2nd through 5th digit: MP Hyperextension with PIP, an DIP flexion     UPPER EXTREMITY MMT:     Left eval Left 03/21/2022 Left  05/25/2022 L 07/20/22 Left  08/15/2022 Left 09/26/2022 Left 11/16/2022 Left 12/28/2022 Left  03/13/2023 Left  05/10/23  Shoulder flexion 3-/5 3/5 3+/5 4+/5 4+/5 4+/5 4+/5 4+/5 4+/5 4+/5  Shoulder abduction 3-/5 3-/5 4-/5 4+/5 4+/5 4+5 4+/5 4+/5 4+/5 4+/5  Shoulder adduction            Shoulder extension            Shoulder internal rotation            Shoulder external rotation            Elbow flexion 3/5 3+/5 4-/5 4+/5 5/5 5/5 5/5 5/5 5/5 5/5  Elbow extension 2-/5 2/5  4+/5 4+/5 4+/5  4/5 4/5 4/5 through available range  Wrist flexion            Wrist extension 2/5 2+/5 3-/5  3-/5 3-/5 3-/5 3/5 3/5 3/5  Wrist ulnar deviation            Wrist radial deviation            Wrist pronation            Wrist supination            (Blank rows = not tested)  Right eval Right 03/21/2022 Right  05/25/2022 R 07/20/22 Right 08/15/2022 Right 09/26/2022 Right 11/16/2022 Right 12/28/2022 Right 03/13/2023 Right 05/10/23  Shoulder flexion 3-/5 scaption 3-/5 3-/5 3+/5 3+/5 3+/5 3/5 3/5 3/5 3+/5  Shoulder abduction 3-/5 3-/5 3-/5 4/5 4/5 4/5 4/5 4/5 4/5 4/-5  Shoulder adduction            Shoulder extension            Shoulder internal rotation            Shoulder external rotation            Elbow flexion 3/5 3/5 3+/5 4/5 4+/5 4+/5 4+/5 4+/5 4+/5 4+/5  Elbow extension 2-/5 2/5 3-/5 4+/5 4+/5 4+/5 4+/5 4-/5 4-/5 4-/5  Wrist flexion            Wrist extension 2-/5 2-/5 2/5 2/5 2/5 2/5 2/5 2/5 2/5 2/5  Wrist ulnar deviation            Wrist radial deviation            Wrist pronation             Wrist supination              HAND FUNCTION:  Grip strength: Right: 0 lbs; Left: 0 lbs and Lateral pinch: Right: 5 lbs, Left: 2 lbs  03/21/2022: Lateral pinch: Right: 3.5 lbs, Left: 2 lbs  07/20/22: Grip strength: Right: 3 lbs; Left: 4 lbs and Lateral pinch: Right: 5 lbs, Left: 3 lbs  08/15/2022:  Grip strength: Right: 0 lbs; Left: 5 lbs and Lateral pinch: Right: 2 lbs, Left: 4 lbs  09/26/2022  Grip strength: Right: 0 lbs; Left: 8 lbs and Lateral pinch: Right: 2 lbs, Left: 5 lbs  11/16/2022  Grip strength: Right: 0 lbs; Left: 8 lbs  9/25/20204  Grip strength: Right: 0 lbs; Left: 8 lbs  Grip strength: Right: 0  lbs; Left: 8 lbs   03/08/23:  Grip strength: Right: 0 lbs without PROM, 5 lbs after PROM, Left 8 lbs  05/10/23:  Grip strength: TBD  Bilateral digit PIP/DIP flexion contractures with MP hyperextension with attempts for AROM. Pt. is able to tolerate AROM to the bilateral digits at the initial evaluation however, has a history of pain in the digits.  COORDINATION: Eval: Pt. is unable to grasp 9-hole test pegs. Pt. is able to initiate grasping larger pegs, and is able to hold a pen in the left hand.  07/20/22: 2 min 36 seconds to remove 9 pegs from 9 hole peg test - cues to locate pegs 2/2 low vision. Pt. is able to initiate grasping larger pegs on R hand and is able to hold a pen in the left hand.  08/15/2022:    Vision, and sensation limiting accuracy of 9 hole peg test results. Pt. Was able to grasp and remove vertical pegs intermittently with cues.    05/10/23: TBD  SENSATION: Light touch: Impaired   EDEMA:  N/A  MUSCLE TONE: BUE flexor Spasticity  COGNITION Overall cognitive status: Continue to assess in functional context  VISION:   Subjective report: Pt. was not wearing glasses at the time of the initial eval.  Baseline vision: Vision is very limited. Wears glasses all the time Visual history: History of impaired vision following CVA. Pt. Has  received treatment through the [facility] low vision rehabilitation program.   VISION ASSESSMENT: Impaired To be further assessed in functional context  PERCEPTION: Impaired   PRAXIS: Impaired: motor planning  OBSERVATIONS:  Pt reports being on Tramadol   TODAY'S TREATMENT   Measurements were obtained, and goals were reviewed with the Pt.  PATIENT EDUCATION: Education details: stretching, PROM, massage to the digits on the bilateral hands. Person educated: Patient and Parent Education method: Explanation Education comprehension: verbalized understanding  HOME EXERCISE PROGRAM:  Continue ongoing assessment, and continue to provide as needed.   GOALS: Goals reviewed with patient? Yes  SHORT TERM GOALS: Target date: 11/07/2022   To assess splint fit, and make appropriate adjustments to promote good skin integrity through the palmar surface of the bilateral hands.  Baseline: 05/25/22: Goal currently met, however ongoing as needs to assess splint fit arise. 03/23/2022: Pt. is wearing splints a couple of hours at night bilateral resting hand splints. 03/21/2022: Pt. is wearing splints a couple of hours at night bilateral resting hand splints. Goal status: Deferred   LONG TERM GOALS: Target date: 08/02/2023    FOTO score will Improve by 2 points for Pt. perceived improvement with the assessment specific ADL/IADL tasks.  Baseline:  03/08/23: 51; 12/28/2022: FOTO score: 56; 11/16/2022: 52 09/27/2022: 51 07/20/2022: FOTO 55 05/25/2022: FOTO score: 50,  TR score: 45 Eval: FOTO score: 40,  TR score: 45 Goal status: Achieved   2.   Pt. will independently perform oral care for 100% of the task after complete set-up. Baseline: 05/10/23: Pt. Performs 90% of the task. Pt.'s mother assists with 10% of the task for thorough cleaning the hard to reach places way in the back of the oral cavity.03/08/23: not as much assist required proximally, but to adjust positioning of toothbrush; 02/13/23: Pt. Continues to  require assist proximally 01/09/2023: Continue 12/28/2022:  Pt. Continues to complete 90% of the task with complete set-up, with visual cues, and occassional assist of the right elbow. 11/16/2022: Pt. Completes 90% of the task with complete set-up. 09/26/2022: Completes 90% of the task, proximal assist at times  required at the elbow.  08/15/2022: Completes 90% of the task, proximal assist at times required at the elbow. 07/20/22: completes 90% of task, limited by shoulder flexion. 05/25/2022:  Pt. Is able to initiate and perform oral care for approximately 90% of the task. Complete set-up required. Assi needed only for the very back teeth. 03/23/2022: Pt. Is able to initiate and perform oral care for approximately 75% of the task. Pt. Requires assist at proximally at the elbow for through oral care. 03/21/2022: Pt. Is able to initiate and perform oral care for approximately 75% of the task. Pt. Requires assist at proximally at the elbow for through oral care. Eval: Pt. is able to initiate using an electric toothbrush. Pt. requires assist for set-up, and assist for thoroughness, and as he Pt. fatigues. Goal status:  Partially met, Goal discontinued  3.  Pt. Will be modified independence with self-feeding for 100% using a swivel spoon, and standard fork after complete set-up Baseline: 05/10/2023: Pt. feeds self 95-100% of the meal. with occasional assist to reposition the utensil. 03/08/23: indep with cup with a handle and lid, assist to reposition eating utensils in hand;  02/13/23:  Pt. Is now able to consistently, and efficiently use a swivel spoon for eating cereal with the left hand.01/09/2023: Continue 12/28/2022: Pt. Is now feeding himself 90% of the time using a swivel spoon with the left hand, and 95% of the time with the fork. Pt. Continues to have difficulty with cutting food. 11/16/2022: 100% with finger foods, Pt. Is initiating with a swivel spoon, and universal cuff for fork use, however requires assist for  consistency, and accuracy.  09/26/2022: 90% using the spoon, assist at time with the forearm motion, and grip on the utensils. 100% for finger foods.  08/15/2022:  90% using the spoon, assit at time with the forearm motion, and grip on the utensils. 100% for finger foods-independent with set-up- unsupervised.  07/20/22: self-feeds cereal using spoon 90% of task. 05/25/2022: Pt. Is able to use a spoon to scoop cereal when feeding himself cereal 85% of the time. Pt. Is able to feed himself snack/finger foods 100% of the time. Pt. Continues to work on consistency of  stabilizing a cup/mug when drinking. Pt. Is able to grasp a water  bottle with assist initially, with assist tapering off as he drinks.03/23/2022: Pt. Is able to perform scooping cereal for 75% of the time. Pt. required assist, and support at the left elbow, and Pt. Presents with limited forearm supination when using the spoon, and bringing it towards his mouth. Pt. Is able to use a fork to spear items, and perform the hand to mouth pattern.  03/21/2022: Pt. Is able to perform scooping cereal for 75% of the time. Pt. required assist, and support at the left elbow, and Pt. presents with limited forearm supination when using the spoon, and bringing it towards his mouth. Pt. Is able to use a fork to spear items, and perform the hand to mouth pattern.  Eval: Pt. is able to hold standard standard utensils. Pt. Performs as much of the task as he, can and has assistance for the remainder. Goal status:  Improved, Achieved  4.  Pt. will improve grasp patterns and consistently grasp 1/4 objects for ADL, and IADL tasks.  Baseline: 05/10/23: Pt. is consistently able to grasp 1/2 objects with visual cues. Pt. Requires assist, and increased visual cues to grasp 1/4 objects on an elevated plane.  Pt. Is unable to grasp the flat at the tabletop  surface. Pt. Is grasping grasping small puppy vitamins.03/08/23: 1/2 objects efficiently; 02/13/2023: 1/2 objects efficiently  01/09/2023: Continue 12/28/2022: Continues to grasp 1/4 pegs with min a + visual cues, consistently grasping 1/2 objects with visual cues 11/16/2022: Continues to grasp 1/4 pegs with min a + visual cues, consistently grasping 1/2 objects with visual cues 09/26/2022: Continues to grasp 1/4 pegs with min a + visual cues, consistently grasping 1/2 objects with visual cues. 08/15/2022: grasps 1/4 pegs with min a + visual cues, consistently grasping 1/2 objects with visual cues. 07/20/22: grasps 1/4 pegs with min a + visual cues, consistently grasping 1/2 objects with visual cues. 05/25/2022: Pt. Is working on improving consistency of grasping 1/2 objects with visual cues.  03/23/2022: Pt. Is able to grasp 1 objects consistently,and continues to work on the hand patterns needed to grasp 1/2 objects.03/21/2022: Pt. Is able to grasp 1 objects consistently,and continues to work on the hand patterns needed to grasp 1/2 objects. Eval: Pt. is able to grasp 1 objects intermittently using a lateral grasp pattern. Goal status: Ongoing  5.  Pt. will independently write his name legibly with letter sizes under 1. Baseline: 05/10/23: Deferred 03/08/23: Not addressed this period; 02/13/2023: Continue 01/09/2023: Continue. 12/28/2022: Pt. is now using an IPad Pen. 11/16/2022: Pt. Continues to be able to write name with smaller letter size. Pt. Is signing name with a computer styus. Pt. Requires visual cues, and assist 09/26/2022: Pt. Continues to be able to write name with smaller letter size. Pt. Is signing name with a computer styus. Pt. Requires visual cues, and assist. 08/15/2022: Pt. Is able to write name with smaller letter size. Pt. Is signing name with a computer styus. Pt. Requires visual cues, and assist. 07/20/22: stabilizing assist to write name with 75% legibility with 2 letters. 05/25/2022: Pt. to continue to work towards formulating grasp patterns in preparation for grasping a large width pen. Pt. Requires  visual cues. 03/23/2022: Pt. Is able to write his name with modA, however has difficulty with formulating letter sizes less than 2 in size with 50% legibility for the 3 letters of his name.03/21/2022: Pt. Is able to write his name with modA, however has difficulty with formulating letter sizes less than 2 in size with 50% legibility for the 3 letters of his name. Eval: Pt is able to hold a thin marker with his left hand, and formulate a line, and initiate a circular pattern (Pt. without glasses today) Goal status: Deferred  6. Pt. Will reach up to comb/brush his hair with minA.  Baseline: 05/10/23: Pt. is able to use a pick independently with the left hand with occasional assist to the far side of the left his head. 03/08/23: Pt uses pick in L hand to reach 75% of his head; unable to reach R side of head; 02/13/2023:Pt. is able to use a hair pick with the left hand on the right side of his head, top, and back of the head. Pt. Continues to have difficulty reaching further to the right side of his head with the left. 01/09/2023: Continue 12/28/2022: Pt. Is progressing, and is now able to use a hair pick with the left hand on the right side of his head, top, and back of the head. Pt. Continues to have difficulty reaching further to the right side of his head with the left. 11/16/2022: Pt. Is able to use a hair pick for the left side of his head using his left hand. Pt. Presents with difficulty reaching to the  right side of his head.09/26/2022: Pt. Is able to use a hair pick for the left side of his head using his left hand. Pt. Presents with difficulty reaching to the right side of his head. 5/13/202: 75% using a hair pick, assist to the right side of the head. 07/20/22: reaches 75% of head, assist for far R side of head, fatigues quickly. 05/25/2022: Pt. Is able to reach up with the left hand to the left side, top, and back of his head. 03/23/2022: Pt. is now able to more consistently initiate reaching up to his head  with his left hand in preparation for haircare12/18/2023: Pt. is now able to more consistently initiate reaching up to his head with his left hand in preparation for haircare. Eval: Pt. is able to initiate reaching up for hair care with a long handled brush, however is unable to sustain UE's in elevation to perform the task.     Goal status: Achieved  7. Pt. Will independently navigate the w/c through his environment with minA with visual scanning, and hand placement on the controls.  Baseline: 11/16/2022: Deferred 2/2 vision 09/26/2022: Pt. Is now navigating his w/c ouside the home, and around his block. 08/15/2022: Pt. Requires minA to setup hand on controls and MIN cues to navigate the w/c in wide spaces, requires MIN - MOD cues to navigate the w/c through more narrow doorways, and tighter turns - varies based on good vs bad vision days.07/20/22: Pt. Requires minA to setup hand on controls and MIN cues to navigate the w/c in wide spaces, requires MIN - MOD cues to navigate the w/c through more narrow doorways, and tighter turns - varies based on good vs bad vision days. 05/25/2022: Pt. Requires minA  and  cues to navigate the w/c in wide spaces, and requires Mod cues to navigate the w/c through more narrow doorways, and tighter turns. Pt. Requires max cues for scanning through the environment, and moderate cues for hand placement on the controls.     Goal status: Deferred 2/2 vision    8. Pt. Will improve bilateral grip strength to be able to independently grasp, and pull up blankets, and linens.   Baseline: 05/10/23: Deferred 03/08/23: R grip 0-5 lbs, L grip 8 lbs; pt reports he can consistently pull up blankets and linens independently.  02/13/2023: Continue. Pt. presents with digit PIP, and DIP flexor tightness. 01/09/2023: Continue 12/28/2023: R: 0#, L: 5# Pt. Is now able to more consistently grasp and pull blankets up over him. 11/16/2022: R: 0 L: 8# Pt. Is more consistently grasping his blankets and  attempting to pull them up. 09/26/2022: R: 0  L: 8# Pt. is attempting to grasp, and pull blankets up more, using mostly the left hand. 08/15/2022: Pt. has difficulty securely holding, and pulling up blankets, and linens.    Goal status:Deferred  9. Pt. will consistently actively control the releasing of blankets, covers, and linens from his hands once they are in the desired position over him. Baseline:12/28/2022: Pt. Is consistently actively able to release linens once they are in the desired position over him. 11/16/2022: Pt. continues to improve with actively releasing blankets form his hands. 09/26/2022: Pt. is improving with actively releasing blankets form his hands. 08/15/2022: Pt. has difficulty with controlled releasing of blankets/linens from his grasp.     Goal status: Achieved    10. Pt. Will improve bilateral UE strength by 2 MM grades to assist with ADLs, and IADLs  Baseline: 05/10/23: Right: shoulder flexion: 3+/5,  abduction: 44-/5, elbow flexion: 4+/5 , extension: 4-/5 wrist extension: 2/5; Left: shoulder flexion: 4+/5, abduction: 4+/5, elbow flexion: 5/5 , extension: 4/5  wrist extension: 3/5 03/13/2023: Right: shoulder flexion: 3/5, abduction: 4/5, elbow flexion: 4+/5 , extension: 4-/5 wrist extension: 2/5; Left: shoulder flexion: 4+/5, abduction: 4+/5, elbow flexion: 5/5 , extension: 4/5  wrist extension: 3/5 03/08/23: NT this date d/t as pt arrived late; 02/13/2023: Continue 01/09/2023: Continue 12/28/2022: Right: shoulder flexion: 3/5, abduction: 4/5, elbow flexion: 4+/5 , extension: 4-/5 wrist extension: 2/5; Left: shoulder flexion: 4+/5, abduction: 4+/5, elbow flexion: 5/5 , extension: 4/5  wrist extension: 3/5 Right: shoulder flexion: 3+/5, abduction: 4/5, elbow flexion: 4+/5 , extension: 4+/5 wrist extension: 2/5; Left: shoulder flexion: 4+/5, abduction: 4+/5, elbow flexion: 5/5 , extension: 4+/5 wrist extension: 3-/5 11/16/2022: Right: shoulder flexion: 3/5, abduction: 4/5, elbow flexion:  4+/5 , extension: 4+/5 wrist extension: 2/5; Left: shoulder flexion: 4+/5, abduction: 4+/5, elbow flexion: 5/5 , extension:  wrist extension: 3-/5 Right: shoulder flexion: 3+/5, abduction: 4/5, elbow flexion: 4+/5 , extension: 4+/5 wrist extension: 2/5; Left: shoulder flexion: 4+/5, abduction: 4+/5, elbow flexion: 5/5 , extension: 4+/5 wrist extension: 3-/5 Goal status: Ongoing  11. Pt. will improve right wrist extension by 10 degrees to initiate reaching for items in preparation for ADLs. Baseline: 05/10/23: Right wrist extension: -32(22) 03/13/23: -22(35) 03/08/23: NT this date as pt arrived late; 02/13/2023: Continue 01/09/2023: Continue 12/28/2022: -22(34) 11/16/2022: -30(10)    Goal status: Ongoing      12. Pt. Will require donn/doff a Tank top T-shirt efficiently with supervision and full set-up.    Baseline: 05/10/23: Pt. Presents with difficulty completing, and requires increased time to complete    Goal status: New    13. Pt. Will will independently perform bilateral/bimanual hand tasks needed to fold towels/linens/laundry Baseline: 05/10/23:  Pt. Has difficulty folding towels/lines/laundry using bilateral hands Goal status: New  14. Pt. Will independently, and efficiently reach his right arm over the side of the chair to pet his dog.    Baseline: 05/10/23: Pt. Is unable to efficiently reach his right UE over the side of his recliner chair to pet his dog.    Goal status: New    15. Pt. Will independently, and efficiently reach out in front of him to efficiently place items onto his table while sitting in his recliner.     Baseline: 05/10/23: Pt. has difficulty reaching out in front of him far enough to efficiently place items on the table while sitting in a recliner chair.    Goal status: New           ASSESSMENT:  CLINICAL IMPRESSION:  Pt. Is progressing engaging in functional tasks at home. Pt. Is now able to grasp, eat, and deshell pistachios, Pt. is now using both hands more to  feed himself breakfast, Pt. Is able to now use his left hand to use a tissue for nasal care, and independently reach out to place it in the trash bin 80% of the time. Pt. Is using his left hand to feed his dog small treats using a 2pt. Grasp. Pt. has met established goals for self-feeding, oral care, and hair-care pick use. Over this past recertification period, the Pt. has missed multiple appointments due to personal illness, caregiver illnesses, weather, and transportation. Pt.'s recent Botox injection appointment was rescheduled by the physician medical group 2/2 weather, which has been rescheduled approximately 2 months out. Pt. Presents with increased flexor tone, and tightness in the RUE, and hand affecting the ROM  gains made during the previous recertificiation period. Goals have been modified, with new goals added to the POC.  Pt. continues to be highly motivated to continue working towards improving BUE functioning, ROM, strength, motor control, [facility] skills, as well as visual compensatory strategies in order to maximize independence with daily ADLs, and IADL functioning.      PERFORMANCE DEFICITS in functional skills including ADLs, IADLs, coordination, dexterity, proprioception, ROM, strength, pain, FMC, GMC, decreased knowledge of use of DME, and UE functional use, cognitive skills including safety awareness, and psychosocial skills including coping strategies, environmental adaptation, habits, and routines and behaviors.   IMPAIRMENTS are limiting patient from ADLs, IADLs, leisure, and social participation.   COMORBIDITIES may have co-morbidities  that affects occupational performance. Patient will benefit from skilled OT to address above impairments and improve overall function.  MODIFICATION OR ASSISTANCE TO COMPLETE EVALUATION: Maximum or significant modification of tasks or assist is necessary to complete an evaluation.  OT OCCUPATIONAL PROFILE AND HISTORY: Comprehensive assessment: Review  of records and extensive additional review of physical, cognitive, psychosocial history related to current functional performance.  CLINICAL DECISION MAKING: High - multiple treatment options, significant modification of task necessary  REHAB POTENTIAL: Fair    EVALUATION COMPLEXITY: High    PLAN: OT FREQUENCY: 2 x's a week  OT DURATION:12 weeks  PLANNED INTERVENTIONS: self care/ADL training, therapeutic exercise, therapeutic activity, neuromuscular re-education, manual therapy, passive range of motion, patient/family education, and cognitive remediation/compensation RECOMMENDED OTHER SERVICES: PT  CONSULTED AND AGREED WITH PLAN OF CARE: Patient and family member/caregiver  PLAN FOR NEXT SESSION: Review HEP  Richardson Otter, MS, OTR/L  05/01/2023

## 2023-05-15 ENCOUNTER — Ambulatory Visit: Payer: Medicare Other

## 2023-05-15 ENCOUNTER — Ambulatory Visit: Payer: Medicare Other | Admitting: Occupational Therapy

## 2023-05-15 DIAGNOSIS — R262 Difficulty in walking, not elsewhere classified: Secondary | ICD-10-CM

## 2023-05-15 DIAGNOSIS — M6281 Muscle weakness (generalized): Secondary | ICD-10-CM

## 2023-05-15 DIAGNOSIS — I6930 Unspecified sequelae of cerebral infarction: Secondary | ICD-10-CM

## 2023-05-15 DIAGNOSIS — R269 Unspecified abnormalities of gait and mobility: Secondary | ICD-10-CM

## 2023-05-15 DIAGNOSIS — R2681 Unsteadiness on feet: Secondary | ICD-10-CM

## 2023-05-15 DIAGNOSIS — R2689 Other abnormalities of gait and mobility: Secondary | ICD-10-CM

## 2023-05-15 DIAGNOSIS — R278 Other lack of coordination: Secondary | ICD-10-CM

## 2023-05-15 NOTE — Therapy (Addendum)
Occupational Therapy Neuro Treatment Note   Patient Name: Paul Hall MRN: [id number] DOB:December 01, 1995, 28 y.o., male Today's Date: 05/15/2023  PCP: Dr. Sherwood Gambler REFERRING PROVIDER: Dr. Sherwood Gambler   OT End of Session -     Visit Number [id number]   Number of Visits 132    Date for OT Re-Evaluation 08/02/2023   OT Start Time 1615    OT Stop Time 1700    OT Time Calculation (min) 45 min    Activity Tolerance Patient tolerated treatment well    Behavior During Therapy WFL for tasks assessed/performed                Past Medical History:  Diagnosis Date   Diabetes mellitus (HCC)    Hypertension    Stroke [facility])    Past Surgical History:  Procedure Laterality Date   IVC FILTER PLACEMENT ([facility] HX)     LEG SURGERY     PEG TUBE PLACEMENT     TRACHEOSTOMY     Patient Active Problem List   Diagnosis Date Noted   Sepsis due to vancomycin resistant Enterococcus species (HCC) 06/06/2019   SIRS (systemic inflammatory response syndrome) (HCC) 06/05/2019   Acute lower UTI 06/05/2019   VRE (vancomycin-resistant Enterococci) infection 06/05/2019   Anemia 06/05/2019   Skin ulcer of sacrum with necrosis of muscle (HCC)    Urinary retention    Type 2 diabetes mellitus without complication, with long-term current use of insulin (HCC)    Tachycardia    Lower extremity edema    Acute metabolic encephalopathy    Obstructive sleep apnea    Morbid obesity with BMI of 60.0-69.9, adult (HCC)    Goals of care, counseling/discussion    Palliative care encounter    Sepsis (HCC) 04/27/2019   H/O insulin dependent diabetes mellitus 04/27/2019   History of CVA with residual deficit 04/27/2019   Seizure disorder (HCC) 04/27/2019   Decubitus ulcer of sacral region, stage 4 (HCC) 04/27/2019   ONSET DATE: 01/2019  REFERRING DIAG: CVA/COVID-19  THERAPY DIAG:  Muscle weakness (generalized)  Rationale for Evaluation and Treatment Rehabilitation  SUBJECTIVE:   SUBJECTIVE STATEMENT:  Pt.   reports that he travelled to [place], this past weekend, and was honored with an award at a [employer] event.  Pt accompanied by: self and family member  PERTINENT HISTORY:  Pt. is a 28 y.o. male who was diagnosed with COVID-19, and CVA with resultant quadriplegia. Pt. Was then hospitalized with VRE UTI. PMHx includes: urinary retention, seizure disorder, obstructive sleep disorder, DM Type II, Morbid obesity.   PRECAUTIONS: None  WEIGHT BEARING RESTRICTIONS No  PAIN:  No reports of pain Are you having pain? 2/10  FALLS: Has patient fallen in last 6 months? No  LIVING ENVIRONMENT: Lives with: lives with their family Lives in: House/apartment Stairs: No Level Entry Has following equipment at home: Wheelchair (power) and hoyer lift, sit to stand lift  PLOF: Independent  PATIENT GOALS: To be able to engage in more daily care tasks.  OBJECTIVE:   HAND DOMINANCE: Right  ADLs:  Transfers/ambulation related to ADLs: Eating: Pt. reports being able to hold standard utensils, and is starting to engage more in self-feeding tasks, hand to mouth patterns. Pt. Reports that he does as much as he can with the task, and family assists  with the remainder of the task. Grooming: Pt. Is able to initiate holding an electric toothbrush, and brush his teeth. Family assists LB Dressing: Total Assist UE dressing: Pt. is now  able to reach up to actively assist with grasping , and pulling his gown down. Toileting:  Total Assist Bathing: MaxA UB, Total assist LB Tub Shower transfers: N/A Equipment: See above    IADLs: Shopping: Relies on family to assist Light housekeeping: Total Assist Meal Prep: Total Assist Community mobility:   Medication management:  Total Assist  Financial management: N/A Handwriting: Not legible: Pt. Is able to hold a pen with the left hand, and initiate marking the page. Pt.'s eye glasses were not available  MOBILITY STATUS:  Power w/c  POSTURE COMMENTS:   Pt. Requires position changes in his power w/c  ACTIVITY TOLERANCE: Activity tolerance:  Fair  FUNCTIONAL OUTCOME MEASURES: FOTO: 40  TR score: 45  05/25/2022:   FOTO: 50 TR score: 45  07/20/22: FOTO 55  03/08/23: 51  UPPER EXTREMITY ROM     Active ROM Left eval Left  03/21/2022 Left 05/25/2022 L  07/20/22 Left 07/25/2022 Left  08/15/2022 Left  09/26/2022 Left  11/16/2022 Left 12/28/2022 Left 03/13/23 Left 05/10/23  Shoulder flexion 119 123 125 130  136 138 130 142 144 144  Shoulder abduction 110 115 115 115  118 121 125 142 140 140  Shoulder adduction             Shoulder extension             Shoulder internal rotation             Shoulder external rotation             Elbow flexion 135(135) 145 145 145  145    145 145  Elbow extension -27(-18) -21(-20) -20(-16)  -25(-10) -23(-10) -21(-10) -20(-18) -20(-18) -20(-17) -17(-16)  Wrist flexion             Wrist extension 10(50) 19(50) 28(50)  30(50) 35(55) 36(55) 36(60) 40(60) 45(60) 45(60)  Wrist ulnar deviation             Wrist radial deviation             Wrist pronation             Wrist supination   Limited by flexor tone       10(15)   (Blank rows = not tested)    Active ROM Right eval Right 03/21/2022 Right 05/25/2022 R  07/20/22 Right  07/25/22 Right 08/15/2022 Right 09/26/2022 Right 11/16/2022 Right 12/28/2022 Right 03/13/23 Right 05/10/23  Shoulder flexion 106 scaption 105  Scaption 62 flexion 110 scaption 100  105 105 105 112 Scaption 123 Scaption 122  Scaption  Shoulder abduction 114 90 97 100  105 117 120 124 124 122  Shoulder adduction             Shoulder extension             Shoulder internal rotation             Shoulder external rotation             Elbow flexion 120(130) 145 145 140  144 140 142 148 148 145  Elbow extension -45(-35) -22(-35) -28(-22)  -32(-21) -32(-28) -28(-26) -28(-28) -27(-26) -27(-40) -38(-35)  Wrist flexion             Wrist extension -30(10) -25(10) -10(30)  -10(20)  -10(25) -20(40) -30(10) -22(34) -22(35) -32(22)  Wrist ulnar deviation             Wrist radial deviation             Wrist  pronation             Wrist supination   Limited by flexor tone            12/28/2022: Thumb radial abduction: Right: 12(46), Left: 40(54)  Digits: Bilateral 2nd through 5th digit: MP Hyperextension with PIP, an DIP flexion     UPPER EXTREMITY MMT:     Left eval Left 03/21/2022 Left  05/25/2022 L 07/20/22 Left  08/15/2022 Left 09/26/2022 Left 11/16/2022 Left 12/28/2022 Left  03/13/2023 Left  05/10/23  Shoulder flexion 3-/5 3/5 3+/5 4+/5 4+/5 4+/5 4+/5 4+/5 4+/5 4+/5  Shoulder abduction 3-/5 3-/5 4-/5 4+/5 4+/5 4+5 4+/5 4+/5 4+/5 4+/5  Shoulder adduction            Shoulder extension            Shoulder internal rotation            Shoulder external rotation            Elbow flexion 3/5 3+/5 4-/5 4+/5 5/5 5/5 5/5 5/5 5/5 5/5  Elbow extension 2-/5 2/5  4+/5 4+/5 4+/5  4/5 4/5 4/5 through available range  Wrist flexion            Wrist extension 2/5 2+/5 3-/5  3-/5 3-/5 3-/5 3/5 3/5 3/5  Wrist ulnar deviation            Wrist radial deviation            Wrist pronation            Wrist supination            (Blank rows = not tested)  Right eval Right 03/21/2022 Right  05/25/2022 R 07/20/22 Right 08/15/2022 Right 09/26/2022 Right 11/16/2022 Right 12/28/2022 Right 03/13/2023 Right 05/10/23  Shoulder flexion 3-/5 scaption 3-/5 3-/5 3+/5 3+/5 3+/5 3/5 3/5 3/5 3+/5  Shoulder abduction 3-/5 3-/5 3-/5 4/5 4/5 4/5 4/5 4/5 4/5 4/-5  Shoulder adduction            Shoulder extension            Shoulder internal rotation            Shoulder external rotation            Elbow flexion 3/5 3/5 3+/5 4/5 4+/5 4+/5 4+/5 4+/5 4+/5 4+/5  Elbow extension 2-/5 2/5 3-/5 4+/5 4+/5 4+/5 4+/5 4-/5 4-/5 4-/5  Wrist flexion            Wrist extension 2-/5 2-/5 2/5 2/5 2/5 2/5 2/5 2/5 2/5 2/5  Wrist ulnar deviation            Wrist radial deviation            Wrist pronation             Wrist supination              HAND FUNCTION:  Grip strength: Right: 0 lbs; Left: 0 lbs and Lateral pinch: Right: 5 lbs, Left: 2 lbs  03/21/2022: Lateral pinch: Right: 3.5 lbs, Left: 2 lbs  07/20/22: Grip strength: Right: 3 lbs; Left: 4 lbs and Lateral pinch: Right: 5 lbs, Left: 3 lbs  08/15/2022:  Grip strength: Right: 0 lbs; Left: 5 lbs and Lateral pinch: Right: 2 lbs, Left: 4 lbs  09/26/2022  Grip strength: Right: 0 lbs; Left: 8 lbs and Lateral pinch: Right: 2 lbs, Left: 5 lbs  11/16/2022  Grip strength: Right: 0 lbs; Left: 8 lbs  9/25/20204  Grip strength: Right: 0 lbs; Left: 8 lbs  Grip strength: Right: 0 lbs; Left: 8 lbs   03/08/23:  Grip strength: Right: 0 lbs without PROM, 5 lbs after PROM, Left 8 lbs  05/10/23:  Grip strength: TBD  Bilateral digit PIP/DIP flexion contractures with MP hyperextension with attempts for AROM. Pt. is able to tolerate AROM to the bilateral digits at the initial evaluation however, has a history of pain in the digits.  COORDINATION: Eval: Pt. is unable to grasp 9-hole test pegs. Pt. is able to initiate grasping larger pegs, and is able to hold a pen in the left hand.  07/20/22: 2 min 36 seconds to remove 9 pegs from 9 hole peg test - cues to locate pegs 2/2 low vision. Pt. is able to initiate grasping larger pegs on R hand and is able to hold a pen in the left hand.  08/15/2022:    Vision, and sensation limiting accuracy of 9 hole peg test results. Pt. Was able to grasp and remove vertical pegs intermittently with cues.    05/10/23: TBD  SENSATION: Light touch: Impaired   EDEMA:  N/A  MUSCLE TONE: BUE flexor Spasticity  COGNITION Overall cognitive status: Continue to assess in functional context  VISION:   Subjective report: Pt. was not wearing glasses at the time of the initial eval.  Baseline vision: Vision is very limited. Wears glasses all the time Visual history: History of impaired vision following CVA. Pt. Has  received treatment through the [facility] low vision rehabilitation program.   VISION ASSESSMENT: Impaired To be further assessed in functional context  PERCEPTION: Impaired   PRAXIS: Impaired: motor planning  OBSERVATIONS:  Pt reports being on Tramadol   TODAY'S TREATMENT     Therapeutic Ex.:   -AROM for right shoulder flexion, abduction, and right sided pectoral stretching.  -Emphasis was placed on slow prolonged gentle passive stretching for RUE, forearm supination, bilateral wrist extension, wrist extension, digit MP, PIP, and DIP extension following moist heat. ROM was performed to decrease stiffness, and prepare the RUE for functional use, and to prepare the hand to assume a position of weightbearing/proprioception.    Manual therapy:   -soft tissue massage to the the volar surface of the 2nd through 5th digits, lateral, and sagittal bands in the digits, as well as the wrist and digit flexors at the forearm 2/2 flexor tightness.   -manual therapy was performed independent of, and in preparation for therapeutic Ex.         PATIENT EDUCATION: Education details: stretching, PROM, massage to the digits on the bilateral hands. Person educated: Patient and Parent Education method: Explanation Education comprehension: verbalized understanding  HOME EXERCISE PROGRAM:  Continue ongoing assessment, and continue to provide as needed.   GOALS: Goals reviewed with patient? Yes  SHORT TERM GOALS: Target date: 11/07/2022   To assess splint fit, and make appropriate adjustments to promote good skin integrity through the palmar surface of the bilateral hands.  Baseline: 05/25/22: Goal currently met, however ongoing as needs to assess splint fit arise. 03/23/2022: Pt. is wearing splints a couple of hours at night bilateral resting hand splints. 03/21/2022: Pt. is wearing splints a couple of hours at night bilateral resting hand splints. Goal status: Deferred   LONG TERM GOALS: Target date:  08/02/2023    FOTO score will Improve by 2 points for Pt. perceived improvement with the assessment specific ADL/IADL tasks.  Baseline:  03/08/23: 51; 12/28/2022: FOTO score: 56; 11/16/2022: 52 09/27/2022: 51 07/20/2022: FOTO  55 05/25/2022: FOTO score: 50,  TR score: 45 Eval: FOTO score: 40,  TR score: 45 Goal status: Achieved   2.   Pt. will independently perform oral care for 100% of the task after complete set-up. Baseline: 05/10/23: Pt. Performs 90% of the task. Pt.'s mother assists with 10% of the task for thorough cleaning the hard to reach places way in the back of the oral cavity.03/08/23: not as much assist required proximally, but to adjust positioning of toothbrush; 02/13/23: Pt. Continues to require assist proximally 01/09/2023: Continue 12/28/2022:  Pt. Continues to complete 90% of the task with complete set-up, with visual cues, and occassional assist of the right elbow. 11/16/2022: Pt. Completes 90% of the task with complete set-up. 09/26/2022: Completes 90% of the task, proximal assist at times required at the elbow.  08/15/2022: Completes 90% of the task, proximal assist at times required at the elbow. 07/20/22: completes 90% of task, limited by shoulder flexion. 05/25/2022:  Pt. Is able to initiate and perform oral care for approximately 90% of the task. Complete set-up required. Assi needed only for the very back teeth. 03/23/2022: Pt. Is able to initiate and perform oral care for approximately 75% of the task. Pt. Requires assist at proximally at the elbow for through oral care. 03/21/2022: Pt. Is able to initiate and perform oral care for approximately 75% of the task. Pt. Requires assist at proximally at the elbow for through oral care. Eval: Pt. is able to initiate using an electric toothbrush. Pt. requires assist for set-up, and assist for thoroughness, and as he Pt. fatigues. Goal status:  Partially met, Goal discontinued  3.  Pt. Will be modified independence with self-feeding for 100% using a  swivel spoon, and standard fork after complete set-up Baseline: 05/10/2023: Pt. feeds self 95-100% of the meal. with occasional assist to reposition the utensil. 03/08/23: indep with cup with a handle and lid, assist to reposition eating utensils in hand;  02/13/23:  Pt. Is now able to consistently, and efficiently use a swivel spoon for eating cereal with the left hand.01/09/2023: Continue 12/28/2022: Pt. Is now feeding himself 90% of the time using a swivel spoon with the left hand, and 95% of the time with the fork. Pt. Continues to have difficulty with cutting food. 11/16/2022: 100% with finger foods, Pt. Is initiating with a swivel spoon, and universal cuff for fork use, however requires assist for consistency, and accuracy.  09/26/2022: 90% using the spoon, assist at time with the forearm motion, and grip on the utensils. 100% for finger foods.  08/15/2022:  90% using the spoon, assit at time with the forearm motion, and grip on the utensils. 100% for finger foods-independent with set-up- unsupervised.  07/20/22: self-feeds cereal using spoon 90% of task. 05/25/2022: Pt. Is able to use a spoon to scoop cereal when feeding himself cereal 85% of the time. Pt. Is able to feed himself snack/finger foods 100% of the time. Pt. Continues to work on consistency of  stabilizing a cup/mug when drinking. Pt. Is able to grasp a water bottle with assist initially, with assist tapering off as he drinks.03/23/2022: Pt. Is able to perform scooping cereal for 75% of the time. Pt. required assist, and support at the left elbow, and Pt. Presents with limited forearm supination when using the spoon, and bringing it towards his mouth. Pt. Is able to use a fork to spear items, and perform the hand to mouth pattern.  03/21/2022: Pt. Is able to perform scooping cereal for 75%  of the time. Pt. required assist, and support at the left elbow, and Pt. presents with limited forearm supination when using the spoon, and bringing it towards his  mouth. Pt. Is able to use a fork to spear items, and perform the hand to mouth pattern.  Eval: Pt. is able to hold standard standard utensils. Pt. Performs as much of the task as he, can and has assistance for the remainder. Goal status:  Improved, Achieved  4.  Pt. will improve grasp patterns and consistently grasp 1/4" objects for ADL, and IADL tasks.  Baseline: 05/10/23: Pt. is consistently able to grasp 1/2" objects with visual cues. Pt. Requires assist, and increased visual cues to grasp 1/4" objects on an elevated plane.  Pt. Is unable to grasp the flat at the tabletop surface. Pt. Is grasping grasping small puppy vitamins.03/08/23: 1/2" objects efficiently; 02/13/2023: 1/2" objects efficiently 01/09/2023: Continue 12/28/2022: Continues to grasp 1/4" pegs with min a + visual cues, consistently grasping 1/2" objects with visual cues 11/16/2022: Continues to grasp 1/4" pegs with min a + visual cues, consistently grasping 1/2" objects with visual cues 09/26/2022: Continues to grasp 1/4" pegs with min a + visual cues, consistently grasping 1/2" objects with visual cues. 08/15/2022: grasps 1/4" pegs with min a + visual cues, consistently grasping 1/2" objects with visual cues. 07/20/22: grasps 1/4" pegs with min a + visual cues, consistently grasping 1/2" objects with visual cues. 05/25/2022: Pt. Is working on improving consistency of grasping 1/2" objects with visual cues.  03/23/2022: Pt. Is able to grasp 1" objects consistently,and continues to work on the hand patterns needed to grasp 1/2" objects.03/21/2022: Pt. Is able to grasp 1" objects consistently,and continues to work on the hand patterns needed to grasp 1/2" objects. Eval: Pt. is able to grasp 1" objects intermittently using a lateral grasp pattern. Goal status: Ongoing  5.  Pt. will independently write his name legibly with letter sizes under 1". Baseline: 05/10/23: Deferred 03/08/23: Not addressed this period; 02/13/2023: Continue 01/09/2023: Continue.  12/28/2022: Pt. is now using an IPad Pen. 11/16/2022: Pt. Continues to be able to write name with smaller letter size. Pt. Is signing name with a computer styus. Pt. Requires visual cues, and assist 09/26/2022: Pt. Continues to be able to write name with smaller letter size. Pt. Is signing name with a computer styus. Pt. Requires visual cues, and assist. 08/15/2022: Pt. Is able to write name with smaller letter size. Pt. Is signing name with a computer styus. Pt. Requires visual cues, and assist. 07/20/22: stabilizing assist to write name with 75% legibility with 2" letters. 05/25/2022: Pt. to continue to work towards formulating grasp patterns in preparation for grasping a large width pen. Pt. Requires visual cues. 03/23/2022: Pt. Is able to write his name with modA, however has difficulty with formulating letter sizes less than 2" in size with 50% legibility for the 3 letters of his name.03/21/2022: Pt. Is able to write his name with modA, however has difficulty with formulating letter sizes less than 2" in size with 50% legibility for the 3 letters of his name. Eval: Pt is able to hold a thin marker with his left hand, and formulate a line, and initiate a circular pattern (Pt. without glasses today) Goal status: Deferred  6. Pt. Will reach up to comb/brush his hair with minA.  Baseline: 05/10/23: Pt. is able to use a pick independently with the left hand with occasional assist to the far side of the left his head. 03/08/23: Pt uses  pick in L hand to reach 75% of his head; unable to reach R side of head; 02/13/2023:Pt. is able to use a hair pick with the left hand on the right side of his head, top, and back of the head. Pt. Continues to have difficulty reaching further to the right side of his head with the left. 01/09/2023: Continue 12/28/2022: Pt. Is progressing, and is now able to use a hair pick with the left hand on the right side of his head, top, and back of the head. Pt. Continues to have difficulty reaching  further to the right side of his head with the left. 11/16/2022: Pt. Is able to use a hair pick for the left side of his head using his left hand. Pt. Presents with difficulty reaching to the right side of his head.09/26/2022: Pt. Is able to use a hair pick for the left side of his head using his left hand. Pt. Presents with difficulty reaching to the right side of his head. 5/13/202: 75% using a hair pick, assist to the right side of the head. 07/20/22: reaches 75% of head, assist for far R side of head, fatigues quickly. 05/25/2022: Pt. Is able to reach up with the left hand to the left side, top, and back of his head. 03/23/2022: Pt. is now able to more consistently initiate reaching up to his head with his left hand in preparation for haircare12/18/2023: Pt. is now able to more consistently initiate reaching up to his head with his left hand in preparation for haircare. Eval: Pt. is able to initiate reaching up for hair care with a long handled brush, however is unable to sustain UE's in elevation to perform the task.     Goal status: Achieved  7. Pt. Will independently navigate the w/c through his environment with minA with visual scanning, and hand placement on the controls.  Baseline: 11/16/2022: Deferred 2/2 vision 09/26/2022: Pt. Is now navigating his w/c ouside the home, and around his block. 08/15/2022: Pt. Requires minA to setup hand on controls and MIN cues to navigate the w/c in wide spaces, requires MIN - MOD cues to navigate the w/c through more narrow doorways, and tighter turns - varies based on good vs bad vision days.07/20/22: Pt. Requires minA to setup hand on controls and MIN cues to navigate the w/c in wide spaces, requires MIN - MOD cues to navigate the w/c through more narrow doorways, and tighter turns - varies based on good vs bad vision days. 05/25/2022: Pt. Requires minA  and  cues to navigate the w/c in wide spaces, and requires Mod cues to navigate the w/c through more narrow doorways, and  tighter turns. Pt. Requires max cues for scanning through the environment, and moderate cues for hand placement on the controls.     Goal status: Deferred 2/2 vision    8. Pt. Will improve bilateral grip strength to be able to independently grasp, and pull up blankets, and linens.   Baseline: 05/10/23: Deferred 03/08/23: R grip 0-5 lbs, L grip 8 lbs; pt reports he can consistently pull up blankets and linens independently.  02/13/2023: Continue. Pt. presents with digit PIP, and DIP flexor tightness. 01/09/2023: Continue 12/28/2023: R: 0#, L: 5# Pt. Is now able to more consistently grasp and pull blankets up over him. 11/16/2022: R: 0 L: 8# Pt. Is more consistently grasping his blankets and attempting to pull them up. 09/26/2022: R: 0  L: 8# Pt. is attempting to grasp, and pull blankets up more,  using mostly the left hand. 08/15/2022: Pt. has difficulty securely holding, and pulling up blankets, and linens.    Goal status:Deferred  9. Pt. will consistently actively control the releasing of blankets, covers, and linens from his hands once they are in the desired position over him. Baseline:12/28/2022: Pt. Is consistently actively able to release linens once they are in the desired position over him. 11/16/2022: Pt. continues to improve with actively releasing blankets form his hands. 09/26/2022: Pt. is improving with actively releasing blankets form his hands. 08/15/2022: Pt. has difficulty with controlled releasing of blankets/linens from his grasp.     Goal status: Achieved    10. Pt. Will improve bilateral UE strength by 2 MM grades to assist with ADLs, and IADLs  Baseline: 05/10/23: Right: shoulder flexion: 3+/5, abduction: 44-/5, elbow flexion: 4+/5 , extension: 4-/5 wrist extension: 2/5; Left: shoulder flexion: 4+/5, abduction: 4+/5, elbow flexion: 5/5 , extension: 4/5  wrist extension: 3/5 03/13/2023: Right: shoulder flexion: 3/5, abduction: 4/5, elbow flexion: 4+/5 , extension: 4-/5 wrist extension: 2/5;  Left: shoulder flexion: 4+/5, abduction: 4+/5, elbow flexion: 5/5 , extension: 4/5  wrist extension: 3/5 03/08/23: NT this date d/t as pt arrived late; 02/13/2023: Continue 01/09/2023: Continue 12/28/2022: Right: shoulder flexion: 3/5, abduction: 4/5, elbow flexion: 4+/5 , extension: 4-/5 wrist extension: 2/5; Left: shoulder flexion: 4+/5, abduction: 4+/5, elbow flexion: 5/5 , extension: 4/5  wrist extension: 3/5 Right: shoulder flexion: 3+/5, abduction: 4/5, elbow flexion: 4+/5 , extension: 4+/5 wrist extension: 2/5; Left: shoulder flexion: 4+/5, abduction: 4+/5, elbow flexion: 5/5 , extension: 4+/5 wrist extension: 3-/5 11/16/2022: Right: shoulder flexion: 3/5, abduction: 4/5, elbow flexion: 4+/5 , extension: 4+/5 wrist extension: 2/5; Left: shoulder flexion: 4+/5, abduction: 4+/5, elbow flexion: 5/5 , extension:  wrist extension: 3-/5 Right: shoulder flexion: 3+/5, abduction: 4/5, elbow flexion: 4+/5 , extension: 4+/5 wrist extension: 2/5; Left: shoulder flexion: 4+/5, abduction: 4+/5, elbow flexion: 5/5 , extension: 4+/5 wrist extension: 3-/5 Goal status: Ongoing  11. Pt. will improve right wrist extension by 10 degrees to initiate reaching for items in preparation for ADLs. Baseline: 05/10/23: Right wrist extension: -32(22) 03/13/23: -22(35) 03/08/23: NT this date as pt arrived late; 02/13/2023: Continue 01/09/2023: Continue 12/28/2022: -22(34) 11/16/2022: -30(10)    Goal status: Ongoing      12. Pt. Will require donn/doff a Tank top T-shirt efficiently with supervision and full set-up.    Baseline: 05/10/23: Pt. Presents with difficulty completing, and requires increased time to complete    Goal status: New    13. Pt. Will will independently perform bilateral/bimanual hand tasks needed to fold towels/linens/laundry Baseline: 05/10/23:  Pt. Has difficulty folding towels/lines/laundry using bilateral hands Goal status: New  14. Pt. Will independently, and efficiently reach his right arm over the side of  the chair to pet his dog.    Baseline: 05/10/23: Pt. Is unable to efficiently reach his right UE over the side of his recliner chair to pet his dog.    Goal status: New    15. Pt. Will independently, and efficiently reach out in front of him to efficiently place items onto his table while sitting in his recliner.     Baseline: 05/10/23: Pt. has difficulty reaching out in front of him far enough to efficiently place items on the table while sitting in a recliner chair.    Goal status: New           ASSESSMENT:  CLINICAL IMPRESSION:  Pt. continues to wait for the rescheduled appointment for Botox Injections to the  BUEs at the end of March. Pt. presents with increased flexor tone, and tightness in the BUEs, with increased tightness in right forearm wrist, and bilateral digits. Pt. is tolerating moist heat modality, soft tissue massage, and ROM with minimal pain. Pt. Mother to bring in a T-shirt next week to work on the goal for UE dressing. Pt. continues to be highly motivated to continue working towards improving BUE functioning, ROM, strength, motor control, [facility] skills, as well as visual compensatory strategies in order to maximize independence with daily ADLs, and IADL functioning.      PERFORMANCE DEFICITS in functional skills including ADLs, IADLs, coordination, dexterity, proprioception, ROM, strength, pain, FMC, GMC, decreased knowledge of use of DME, and UE functional use, cognitive skills including safety awareness, and psychosocial skills including coping strategies, environmental adaptation, habits, and routines and behaviors.   IMPAIRMENTS are limiting patient from ADLs, IADLs, leisure, and social participation.   COMORBIDITIES may have co-morbidities  that affects occupational performance. Patient will benefit from skilled OT to address above impairments and improve overall function.  MODIFICATION OR ASSISTANCE TO COMPLETE EVALUATION: Maximum or significant modification of tasks or  assist is necessary to complete an evaluation.  OT OCCUPATIONAL PROFILE AND HISTORY: Comprehensive assessment: Review of records and extensive additional review of physical, cognitive, psychosocial history related to current functional performance.  CLINICAL DECISION MAKING: High - multiple treatment options, significant modification of task necessary  REHAB POTENTIAL: Fair    EVALUATION COMPLEXITY: High    PLAN: OT FREQUENCY: 2 x's a week  OT DURATION:12 weeks  PLANNED INTERVENTIONS: self care/ADL training, therapeutic exercise, therapeutic activity, neuromuscular re-education, manual therapy, passive range of motion, patient/family education, and cognitive remediation/compensation RECOMMENDED OTHER SERVICES: PT  CONSULTED AND AGREED WITH PLAN OF CARE: Patient and family member/caregiver  PLAN FOR NEXT SESSION: Review HEP  Olegario Messier, MS, OTR/L  05/15/2023

## 2023-05-15 NOTE — Therapy (Signed)
 OUTPATIENT PHYSICAL THERAPY TREATMENT      Patient Name: Paul Hall MRN: [id number] DOB:06/06/1995, 28 y.o., male Today's Date: 05/15/2023  PCP:  Waunita Haff PROVIDER:  Susie Erichsen, PA-C   PT End of Session - 05/15/23 1729     Visit Number 147    Number of Visits 160   date corrected from most recent recert   Date for PT Re-Evaluation 05/14/22   Date corrected from most recent recert   Authorization Type [employer]/ [place] Medicaid    Authorization Time Period 01/04/21-03/29/21; Recert 03/24/2021-06/16/2021; Recert 09/15/2021- 12/08/2021; Recert 12/13/2021-03/07/2022    Progress Note Due on Visit 150    PT Start Time 1533    PT Stop Time 1616    PT Time Calculation (min) 43 min    Equipment Utilized During Treatment Gait belt    Activity Tolerance Patient tolerated treatment well    Behavior During Therapy WFL for tasks assessed/performed                          Past Medical History:  Diagnosis Date   Diabetes mellitus (HCC)    Hypertension    Stroke (HCC)    Past Surgical History:  Procedure Laterality Date   IVC FILTER PLACEMENT ([facility] HX)     LEG SURGERY     PEG TUBE PLACEMENT     TRACHEOSTOMY     Patient Active Problem List   Diagnosis Date Noted   Sepsis due to vancomycin  resistant Enterococcus species (HCC) 06/06/2019   SIRS (systemic inflammatory response syndrome) (HCC) 06/05/2019   Acute lower UTI 06/05/2019   VRE (vancomycin -resistant Enterococci) infection 06/05/2019   Anemia 06/05/2019   Skin ulcer of sacrum with necrosis of muscle (HCC)    Urinary retention    Type 2 diabetes mellitus without complication, with long-term current use of insulin  (HCC)    Tachycardia    Lower extremity edema    Acute metabolic encephalopathy    Obstructive sleep apnea    Morbid obesity with BMI of 60.0-69.9, adult (HCC)    Goals of care, counseling/discussion    Palliative care encounter    Sepsis (HCC) 04/27/2019   H/O insulin  dependent  diabetes mellitus 04/27/2019   History of CVA with residual deficit 04/27/2019   Seizure disorder (HCC) 04/27/2019   Decubitus ulcer of sacral region, stage 4 (HCC) 04/27/2019    REFERRING DIAG: Cerebral infarction, unspecified   THERAPY DIAG:  Muscle weakness (generalized)  Other lack of coordination  Difficulty in walking, not elsewhere classified  Unsteadiness on feet  Abnormality of gait and mobility  Other abnormalities of gait and mobility  History of CVA with residual deficit  Rationale for Evaluation and Treatment Rehabilitation  PERTINENT HISTORY: Paul Hall is a 26yoM who presents with severe weakness, quadriparesis, altered sensorium, and visual impairment s/p critical illness and prolonged hospitalization. Pt hospitalized in October 2020 with ARDS 2/2 COVID19 infection. Pt sustained a complex and lengthy hospitalization which included tracheostomy, prolonged sedation, ECMO. In this period pt sustained CVA and SDH. Pt has now been liberated from tracheostomy and G-tube. Pt has since been hospitalized for wound infection and UTI. Pt lives with parents at home, has hospital bed and left chair, hoyer lift transfers, and power WC for mobility needs. Pt needs heavy physical assistance with ADL 2/2 BUE contractures and motor dysfunction   PRECAUTIONS: Fall  SUBJECTIVE:  Patient reports feeling pretty good and denies any pain. States he feels better from recent  sickness and been working out as able at home.   PAIN:  Are you having pain? No. But feeling tight.    TODAY'S TREATMENT:  - 05/15/23    Hoyer transfer to mat- Dependent with assist of PT and patient mom   Therapeutic Activities:   Sitting balance with Thoracic trunk twists x 15 reps each direction  Sitting balance with anterior/posterior weight shift   Dynamic seated swing leg forward/diagonal to faciliate potential pivoting- 15 reps x 2 sets each LE  Left hand pressing into mat to facilitate using left  UE shoulder depressors 2 sets of 15 reps.  Dynamic hip abd/add to faciliate lateral motion for lateral sitting mobility 2 sets of 15 reps  Therex:  Active LAQ 2 sets of 15 reps each LE   Maximove transfer back to WC.       PATIENT EDUCATION: Education details: Exercise technique with VC   HOME EXERCISE PROGRAM:  Access Code: [email or web address] URL: [email or web address] Date: 03/23/2022 Prepared by: Ferrell Hu  Exercises - Supine Bridge  - 3 x weekly - 3 sets - 10 reps - 2 hold - Supine Gluteal Sets  - 3 x weekly - 3 sets - 10 reps - 5 sec hold - Supine Quad Set  - 1 x daily - 3 x weekly - 3 sets - 10 reps - 5 hold - Seated Long Arc Quad  - 1 x daily - 7 x weekly - 3 sets - 10 reps - Seated Hip Adduction Squeeze with Ball  - 1 x daily - 3 x weekly - 3 sets - 10 reps - 5 hold - Seated Hip Abduction  - 1 x daily - 3 x weekly - 3 sets - 10 reps - 2 hold    PT Short Term Goals -       PT SHORT TERM GOAL #1   Title Pt will be independent with HEP in order to improve strength and balance in order to decrease fall risk and improve function at home and work.    Baseline 01/04/2021= No formal HEP in place; 12/12 no HEP in place; 05/10/2021-Patient and his father were able to report compliance with curent HEP consisting of mostly seated/reclined LE strengthening. Both verbalize no questions at this time.    Time 6    Period Weeks    Status Achieved    Target Date 02/15/21            PT LONG TERM GOAL #1     Title Patient will increase BLE gross strength by 1/2 muscle grade to improve functional strength for improved independence with potential gait, increased standing tolerance and increased ADL ability.     Baseline 01/04/2021- Patient presents with 1/5 to 3-/5 B LE strength with MMT; 12/12: goal partially met for Left knee/hip; 05/10/2021= 2-/5 bilateal Hip flex; 3+/5 bilateral Knee ext; 06/21/2021= Patient presents with 2-/5 bilateral Hip flex; 3+/5 bilateral knee  ext/flex; 2-/5 left ankle DF; 0/5 right ankle- and able to increase reps and resistance with LE's. 09/15/2021- Patient technically presents with 2-/5 B hip flex/abd/add - but he is able to raise his hip up to approx 100 deg which has improved. 3+/5 Bilateral knee ext, 2-/5 left ankle and 0/5 right ankle.  12/08/2021= Patient able to lift left knee at 110 deg of hip flex; presents with 3+/5 knee ext, 2-/5 left ankle DF and 0/5 right ankle DF, 2-/5 bilateral Hip abd in seated position.    12/6: R: knee 3+/5 ext, 2/5 flexion,  left knee 3+/5 extension, 3+/5 flexion, R hip: 2+/5 hip add, 2+/5 hip ABD L hip: 4-/5 hip ABD, 3+/5 hip ADD, 3+/5 hip flexion; 06/06/2022= Patient now presents with 2-/5 right ankle DF/PF;     Time 12     Period Weeks     Status MET    Target Date 03/07/2022         PT LONG TERM GOAL #2    Title Patient will tolerate sitting unsupported demonstrating erect sitting posture for 15 minutes with CGA to demonstrate improved back extensor strength and improved sitting tolerance.     Baseline 01/04/2021- Patient confied to sitting in lift chair or electric power chair with back support and unable to sit upright without physical assistance; 12/12: tolerates <1 minutes upright unsupported sitting. 05/10/2021=static sit with forward trunk lean  in his power wheelchair without back support x approx 3 min. 06/21/2021=Unable to assess today due to patient with acute back pain but on previous visit able to sit x 8 min without back support. 09/15/2021- on last visit- 09/13/2021- patient was able to sit unsupported x 8 min at edge of mat. 10/13/2023 - Patient was able to sit at edge of mat with varying level of assist today from SBA to min A for a total of 20 min. 12/13/2021= Patient demonstrated unsupported sitting at edge of mat for approx 20 min    Time 12     Period Weeks     Status GOAL MET    Target Date 12/08/21          PT LONG TERM GOAL #3    Title Patient will demonstrate ability to perform static  standing in // bars > 2 min with Max Assist  without loss of balance and fair posture for improved overall strength for pre-gait and transfer activities.     Baseline 01/04/2021= Patient current uanble to stand- Dependent on hoyer or sit to stand lift for transfers. 05/10/2021=Not appropriate yet- Currently still dependent with all transfers using hoyer. 06/21/2021= Patient continuing now to focus on LE strengthening to prepare for standing-unable to try today due to acute low back pain-  planning on attempting in new cert period. 09/15/2021- Patient has attempted standing 2x in past two week- max Assist of 2 people - only once was he successful to clearing his bottom from chair - Will continue to be a focus during the new certification. 12/13/2021= Patient has been limited secondary to increased overall low back pain during this certification and will require more time to focus on this goal.  12/6: not assessed this date, will assess at date when 2-3 PTs are present for assistance     Time 12     Period Weeks     Status GOAL not appropriate at this time - may attempt in future once Patient presents with improved overall LE strength.                 PT LONG TERM GOAL #4    Title Pt will improve FOTO score by 10 points or more demonstrating improved perceived functional ability     Baseline FOTO 7 on 10/17; 03/15/21: FOTO 12; 05/10/2021 06/21/2021= 1; 09/15/2021= 9; 12/13/2021= Will issue next visit 12/6: 4; 06/06/2022= Will assess next visit; Goal not appropriate as patient is not ambulatory.     Time 12     Period Weeks     Status WITHDRAWN    Target date 11/30/2022         PT  LONG TERM GOAL #5    Title Patient will perform sit to stand transfer with appropriate AD and max assist of 2 people with 75% consistency to prepare for pregait activities.     Baseline 09/15/2021= Patient unable to stand well- unable to clear his bottom off chair with Max assist of 2 persons. 12/13/2021- Goal not appropriate to try yet but  will keep and roll over to next cert as shift continues to focus on transfers/standing; 4/24= Patient able to perform active ankle DF/PF with right LE and able to raise his knee into seated march and clear floor without physical assist today - as previously unable as well as lift right knee ext to near full ROM to improve strength for eventual standing. 09/07/2022- Goal continues to not be appropriate but patient is now standing some and will keep goal active; 11/23/2022= patient is now performing sit to stand with max A +2 and now able to clear his bottom off mat. 11/30/2022- Patient continues to perform sit to stand transfers with Max A+2 and able to clear bottom well off mat but not erect yet-     Time 12     Period Weeks     Status PROGRESSING    Target date  02/22/2023       PT LONG TERM GOAL #6  Title Patient will tolerate sitting unsupported demonstrating erect sitting posture for 30 minutes with CGA to demonstrate improved back extensor strength and improved sitting tolerance.   Baseline 12/13/2021= Patient demonstrated unsupported sitting at edge of mat for approx 20 min;  12/29/2021- Patient performed approx 30 min of dynamic sitting activities today. 06/06/2022= Patient demonstrated ability to sit and perform static and dynamic UE/LE movement with only Supervision.   Time 12   Period Weeks   Status GOAL MET           7.  Patient will tolerate 2 minutes or more of standing in sit to stand lift or with max assist +2 in order to indicate improved lower extremity weightbearing tolerance for progression to standing in parallel bars. Baseline: 1 minute on most recent stand 02/21/22; 06/06/2022- Patient did attempt today after complaining of right LE pain last week. He attempted 3 stands using sit to stand lift. - all over 1 min- last one approx 48 sec- stopped due to fatigue. More erect standing in lift today - still poor gluteal strength but able to activate glutes and extend hips upon command but  unable to hold > 5 sec. 07/28/2022- Will attempt standing next visit but patient has been able to stand since last progress note for up to 3 min at a time at his best. 09/07/2022-  attempted standing with max +2 and patient able to stand 15-20 sec so will now  revise goal; 11/23/2022- Patient at his best between last 2 visits was able to stand approx 50 sec with max A+2.  11/30/2022- Patient performed 4 trials of static standing - max A +2- clearing bottom and using forearms to bear weight  03/15/2023: Patient performed 2 trials of static standing - max A +2- clearing edge of mat and using forearms to bear weight for approximately 45 seconds, is able to come into approximately 45 degrees of knee flexion from sit to stand Goal status: PROGRESSING Target date: 02/22/2023   8. Patient will tolerate sitting unsupported demonstrating ability to perform dynamic UE/LE activities for 30 minutes  independently to demonstrate ability to sit at edge of bed to eat or perform  some ADL's/exercise for optimal quality of life.             Baseline: 06/06/2022- Patient able to sit and perform some UE/LE exercises but requires CGA at times for safety- he is able to static sit for 30 min with supervision. 07/27/2022= Patient able to now sit > 30 min with Supervision only - performing static and dynamic activities. Will keep goal active to ensure patient is consistent. 09/07/2022=  Patient able to demonstrate consistent ability to sit at edge of mat during visits             Goal Status: MET             Target date: 08/29/2022        Plan     Clinical Impression Statement Patient returns to clinic with good motivation- able to facilitate some trunk mobility and later LE movements to facilitate some future potential pivoting. He was provided feedback to be able to swing his leg out to side by use of PT placing own foot out in front of patient and having him swing his leg out to side until he tapped PT foot.   Pt will continue to  benefit from skilled physical therapy intervention to address impairments, improve QOL, and attain therapy goals.    Personal Factors and Comorbidities Comorbidity 3+;Time since onset of injury/illness/exacerbation    Comorbidities CVA, diabetes, Seizures    Examination-Activity Limitations Bathing;Bed Mobility;Bend;Caring for Others;Carry;Dressing;Hygiene/Grooming;Lift;Locomotion Level;Reach Overhead;Self Feeding;Sit;Squat;Stairs;Stand;Transfers;Toileting    Examination-Participation Restrictions Cleaning;Community Activity;Driving;Laundry;Medication Management;Meal Prep;Occupation;Personal Finances;Shop;Yard Work;Volunteer    Stability/Clinical Decision Making Evolving/Moderate complexity    Rehab Potential Fair    PT Frequency 2x / week    PT Duration 12 weeks    PT Treatment/Interventions ADLs/Self Care Home Management;Cryotherapy;Electrical Stimulation;Moist Heat;Ultrasound;DME Instruction;Gait training;Stair training;Functional mobility training;Therapeutic exercise;Balance training;Patient/family education;Orthotic Fit/Training;Neuromuscular re-education;Wheelchair mobility training;Manual techniques;Passive range of motion;Dry needling;Energy conservation;Taping;Visual/perceptual remediation/compensation;Joint Manipulations    PT Next Visit Plan core strength/motor control in short-sitting, sit to stand if able; Continue with progressive LE Strengthening. Stand activities as appropriate. RECERT NEXT VISIT    PT Home Exercise Plan No changes to HEP today    Consulted and Agree with Plan of Care Patient;Family member/caregiver    Family Member Consulted             03/09/2023, 8:04 AM      5:39 PM, 05/15/23 [employer] PT Physical Therapist - [facility]  5:39 PM 05/15/23

## 2023-05-17 ENCOUNTER — Ambulatory Visit: Payer: Medicare Other

## 2023-05-17 ENCOUNTER — Ambulatory Visit: Payer: Medicare Other | Admitting: Occupational Therapy

## 2023-05-22 ENCOUNTER — Ambulatory Visit: Payer: Medicare Other | Admitting: Occupational Therapy

## 2023-05-22 ENCOUNTER — Ambulatory Visit: Payer: Medicare Other

## 2023-05-22 DIAGNOSIS — R32 Unspecified urinary incontinence: Secondary | ICD-10-CM | POA: Diagnosis not present

## 2023-05-22 DIAGNOSIS — L8994 Pressure ulcer of unspecified site, stage 4: Secondary | ICD-10-CM | POA: Diagnosis not present

## 2023-05-22 DIAGNOSIS — I6389 Other cerebral infarction: Secondary | ICD-10-CM | POA: Diagnosis not present

## 2023-05-24 ENCOUNTER — Ambulatory Visit: Payer: Medicare Other | Admitting: Occupational Therapy

## 2023-05-24 ENCOUNTER — Ambulatory Visit: Payer: Medicare Other

## 2023-05-25 DIAGNOSIS — G4733 Obstructive sleep apnea (adult) (pediatric): Secondary | ICD-10-CM | POA: Diagnosis not present

## 2023-05-29 ENCOUNTER — Encounter: Payer: Medicare Other | Admitting: Occupational Therapy

## 2023-05-29 ENCOUNTER — Ambulatory Visit: Payer: Medicare Other

## 2023-05-31 ENCOUNTER — Ambulatory Visit: Payer: Medicare Other | Admitting: Occupational Therapy

## 2023-05-31 ENCOUNTER — Ambulatory Visit: Payer: Medicare Other

## 2023-05-31 DIAGNOSIS — I6930 Unspecified sequelae of cerebral infarction: Secondary | ICD-10-CM | POA: Diagnosis not present

## 2023-05-31 DIAGNOSIS — R269 Unspecified abnormalities of gait and mobility: Secondary | ICD-10-CM

## 2023-05-31 DIAGNOSIS — R2681 Unsteadiness on feet: Secondary | ICD-10-CM

## 2023-05-31 DIAGNOSIS — R278 Other lack of coordination: Secondary | ICD-10-CM

## 2023-05-31 DIAGNOSIS — M6281 Muscle weakness (generalized): Secondary | ICD-10-CM

## 2023-05-31 DIAGNOSIS — R2689 Other abnormalities of gait and mobility: Secondary | ICD-10-CM

## 2023-05-31 DIAGNOSIS — R262 Difficulty in walking, not elsewhere classified: Secondary | ICD-10-CM | POA: Diagnosis not present

## 2023-05-31 NOTE — Therapy (Signed)
 OUTPATIENT PHYSICAL THERAPY TREATMENT/RECERT    Patient Name: Paul Hall MRN: [id number] DOB:1995-05-29, 28 y.o., male Today's Date: 06/01/2023  PCP:  Cindi Carbon PROVIDER:  Lenise Herald, PA-C   PT End of Session - 06/01/23 0800     Visit Number 148    Number of Visits 160   date corrected from most recent recert   Date for PT Re-Evaluation 08/23/23    Authorization Type [employer] COMM [employer]/ [place] Medicaid    Authorization Time Period 01/04/21-03/29/21; Recert 03/24/2021-06/16/2021; Recert 09/15/2021- 12/08/2021; Recert 12/13/2021-03/07/2022    Progress Note Due on Visit 150    PT Start Time 1615    PT Stop Time 1705    PT Time Calculation (min) 50 min    Equipment Utilized During Treatment Gait belt    Activity Tolerance Patient tolerated treatment well    Behavior During Therapy WFL for tasks assessed/performed                           Past Medical History:  Diagnosis Date   Diabetes mellitus (HCC)    Hypertension    Stroke (HCC)    Past Surgical History:  Procedure Laterality Date   IVC FILTER PLACEMENT ([facility] HX)     LEG SURGERY     PEG TUBE PLACEMENT     TRACHEOSTOMY     Patient Active Problem List   Diagnosis Date Noted   Sepsis due to vancomycin resistant Enterococcus species (HCC) 06/06/2019   SIRS (systemic inflammatory response syndrome) (HCC) 06/05/2019   Acute lower UTI 06/05/2019   VRE (vancomycin-resistant Enterococci) infection 06/05/2019   Anemia 06/05/2019   Skin ulcer of sacrum with necrosis of muscle (HCC)    Urinary retention    Type 2 diabetes mellitus without complication, with long-term current use of insulin (HCC)    Tachycardia    Lower extremity edema    Acute metabolic encephalopathy    Obstructive sleep apnea    Morbid obesity with BMI of 60.0-69.9, adult (HCC)    Goals of care, counseling/discussion    Palliative care encounter    Sepsis (HCC) 04/27/2019   H/O insulin dependent diabetes mellitus 04/27/2019   History  of CVA with residual deficit 04/27/2019   Seizure disorder (HCC) 04/27/2019   Decubitus ulcer of sacral region, stage 4 (HCC) 04/27/2019    REFERRING DIAG: Cerebral infarction, unspecified   THERAPY DIAG:  Muscle weakness (generalized)  Other lack of coordination  Difficulty in walking, not elsewhere classified  Unsteadiness on feet  Abnormality of gait and mobility  Other abnormalities of gait and mobility  History of CVA with residual deficit  Rationale for Evaluation and Treatment Rehabilitation  PERTINENT HISTORY: Paul Hall is a 26yoM who presents with severe weakness, quadriparesis, altered sensorium, and visual impairment s/p critical illness and prolonged hospitalization. Pt hospitalized in October 2020 with ARDS 2/2 COVID19 infection. Pt sustained a complex and lengthy hospitalization which included tracheostomy, prolonged sedation, ECMO. In this period pt sustained CVA and SDH. Pt has now been liberated from tracheostomy and G-tube. Pt has since been hospitalized for wound infection and UTI. Pt lives with parents at home, has hospital bed and left chair, hoyer lift transfers, and power WC for mobility needs. Pt needs heavy physical assistance with ADL 2/2 BUE contractures and motor dysfunction   PRECAUTIONS: Fall  SUBJECTIVE:  Patient reports having a good week and states he is stiff today from missed visits. Reports he is willing to try any standing  but just would like a few min of stretching first.     PAIN:  Are you having pain? No. But feeling tight.    TODAY'S TREATMENT:  - 06/01/23     THEREX:  Passive stretching to each ankle, knee, and hip- flex/ext and rotation for each joint x 10 min   Hoyer transfer to mat- Dependent with assist of PT and patient mom  Physical therapy treatment session today consisted of completing assessment of goals and administration of testing as demonstrated and documented in flow sheet, treatment, and goals section of this  note. Addition treatments may be found below.           PATIENT EDUCATION: Education details: Exercise technique with VC   HOME EXERCISE PROGRAM:  Access Code: [email or web address] URL: [email or web address] Date: 03/23/2022 Prepared by: Maureen Ralphs  Exercises - Supine Bridge  - 3 x weekly - 3 sets - 10 reps - 2 hold - Supine Gluteal Sets  - 3 x weekly - 3 sets - 10 reps - 5 sec hold - Supine Quad Set  - 1 x daily - 3 x weekly - 3 sets - 10 reps - 5 hold - Seated Long Arc Quad  - 1 x daily - 7 x weekly - 3 sets - 10 reps - Seated Hip Adduction Squeeze with Ball  - 1 x daily - 3 x weekly - 3 sets - 10 reps - 5 hold - Seated Hip Abduction  - 1 x daily - 3 x weekly - 3 sets - 10 reps - 2 hold    PT Short Term Goals -       PT SHORT TERM GOAL #1   Title Pt will be independent with HEP in order to improve strength and balance in order to decrease fall risk and improve function at home and work.    Baseline 01/04/2021= No formal HEP in place; 12/12 no HEP in place; 05/10/2021-Patient and his father were able to report compliance with curent HEP consisting of mostly seated/reclined LE strengthening. Both verbalize no questions at this time.    Time 6    Period Weeks    Status Achieved    Target Date 02/15/21            PT LONG TERM GOAL #1     Title Patient will increase BLE gross strength by 1/2 muscle grade to improve functional strength for improved independence with potential gait, increased standing tolerance and increased ADL ability.     Baseline 01/04/2021- Patient presents with 1/5 to 3-/5 B LE strength with MMT; 12/12: goal partially met for Left knee/hip; 05/10/2021= 2-/5 bilateal Hip flex; 3+/5 bilateral Knee ext; 06/21/2021= Patient presents with 2-/5 bilateral Hip flex; 3+/5 bilateral knee ext/flex; 2-/5 left ankle DF; 0/5 right ankle- and able to increase reps and resistance with LE's. 09/15/2021- Patient technically presents with 2-/5 B hip flex/abd/add - but he  is able to raise his hip up to approx 100 deg which has improved. 3+/5 Bilateral knee ext, 2-/5 left ankle and 0/5 right ankle.  12/08/2021= Patient able to lift left knee at 110 deg of hip flex; presents with 3+/5 knee ext, 2-/5 left ankle DF and 0/5 right ankle DF, 2-/5 bilateral Hip abd in seated position.    12/6: R: knee 3+/5 ext, 2/5 flexion, left knee 3+/5 extension, 3+/5 flexion, R hip: 2+/5 hip add, 2+/5 hip ABD L hip: 4-/5 hip ABD, 3+/5 hip ADD, 3+/5 hip flexion; 06/06/2022= Patient now presents with 2-/5  right ankle DF/PF;     Time 12     Period Weeks     Status MET    Target Date 03/07/2022         PT LONG TERM GOAL #2    Title Patient will tolerate sitting unsupported demonstrating erect sitting posture for 15 minutes with CGA to demonstrate improved back extensor strength and improved sitting tolerance.     Baseline 01/04/2021- Patient confied to sitting in lift chair or electric power chair with back support and unable to sit upright without physical assistance; 12/12: tolerates <1 minutes upright unsupported sitting. 05/10/2021=static sit with forward trunk lean  in his power wheelchair without back support x approx 3 min. 06/21/2021=Unable to assess today due to patient with acute back pain but on previous visit able to sit x 8 min without back support. 09/15/2021- on last visit- 09/13/2021- patient was able to sit unsupported x 8 min at edge of mat. 10/13/2023 - Patient was able to sit at edge of mat with varying level of assist today from SBA to min A for a total of 20 min. 12/13/2021= Patient demonstrated unsupported sitting at edge of mat for approx 20 min    Time 12     Period Weeks     Status GOAL MET    Target Date 12/08/21          PT LONG TERM GOAL #3    Title Patient will demonstrate ability to perform static standing in // bars > 2 min with Max Assist  without loss of balance and fair posture for improved overall strength for pre-gait and transfer activities.     Baseline 01/04/2021=  Patient current uanble to stand- Dependent on hoyer or sit to stand lift for transfers. 05/10/2021=Not appropriate yet- Currently still dependent with all transfers using hoyer. 06/21/2021= Patient continuing now to focus on LE strengthening to prepare for standing-unable to try today due to acute low back pain-  planning on attempting in new cert period. 09/15/2021- Patient has attempted standing 2x in past two week- max Assist of 2 people - only once was he successful to clearing his bottom from chair - Will continue to be a focus during the new certification. 12/13/2021= Patient has been limited secondary to increased overall low back pain during this certification and will require more time to focus on this goal.  12/6: not assessed this date, will assess at date when 2-3 PTs are present for assistance     Time 12     Period Weeks     Status GOAL not appropriate at this time - may attempt in future once Patient presents with improved overall LE strength.                 PT LONG TERM GOAL #4    Title Pt will improve FOTO score by 10 points or more demonstrating improved perceived functional ability     Baseline FOTO 7 on 10/17; 03/15/21: FOTO 12; 05/10/2021 06/21/2021= 1; 09/15/2021= 9; 12/13/2021= Will issue next visit 12/6: 4; 06/06/2022= Will assess next visit; Goal not appropriate as patient is not ambulatory.     Time 12     Period Weeks     Status WITHDRAWN    Target date 11/30/2022         PT LONG TERM GOAL #5    Title Patient will perform sit to stand transfer with appropriate AD and max assist of 2 people with 75% consistency to prepare for pregait  activities.     Baseline 09/15/2021= Patient unable to stand well- unable to clear his bottom off chair with Max assist of 2 persons. 12/13/2021- Goal not appropriate to try yet but will keep and roll over to next cert as shift continues to focus on transfers/standing; 4/24= Patient able to perform active ankle DF/PF with right LE and able to raise his knee  into seated march and clear floor without physical assist today - as previously unable as well as lift right knee ext to near full ROM to improve strength for eventual standing. 09/07/2022- Goal continues to not be appropriate but patient is now standing some and will keep goal active; 11/23/2022= patient is now performing sit to stand with max A +2 and now able to clear his bottom off mat. 11/30/2022- Patient continues to perform sit to stand transfers with Max A+2 and able to clear bottom well off mat but not erect yet. 05/31/2023- Patient is able to stand consistently with max A +2 - unable to achieve full knee or trunk ext yet is able to clear bottom off mat for improved LE weightbearing.     Time 12     Period Weeks     Status PROGRESSING    Target date  08/23/2023       PT LONG TERM GOAL #6  Title Patient will tolerate sitting unsupported demonstrating erect sitting posture for 30 minutes with CGA to demonstrate improved back extensor strength and improved sitting tolerance.   Baseline 12/13/2021= Patient demonstrated unsupported sitting at edge of mat for approx 20 min;  12/29/2021- Patient performed approx 30 min of dynamic sitting activities today. 06/06/2022= Patient demonstrated ability to sit and perform static and dynamic UE/LE movement with only Supervision.   Time 12   Period Weeks   Status GOAL MET           7.  Patient will tolerate 2 minutes or more of standing in sit to stand lift or with max assist +2 in order to indicate improved lower extremity weightbearing tolerance for progression to standing in parallel bars. Baseline: 1 minute on most recent stand 02/21/22; 06/06/2022- Patient did attempt today after complaining of right LE pain last week. He attempted 3 stands using sit to stand lift. - all over 1 min- last one approx 48 sec- stopped due to fatigue. More erect standing in lift today - still poor gluteal strength but able to activate glutes and extend hips upon command but unable  to hold > 5 sec. 07/28/2022- Will attempt standing next visit but patient has been able to stand since last progress note for up to 3 min at a time at his best. 09/07/2022-  attempted standing with max +2 and patient able to stand 15-20 sec so will now  revise goal; 11/23/2022- Patient at his best between last 2 visits was able to stand approx 50 sec with max A+2.  11/30/2022- Patient performed 4 trials of static standing - max A +2- clearing bottom and using forearms to bear weight  03/15/2023: Patient performed 2 trials of static standing - max A +2- clearing edge of mat and using forearms to bear weight for approximately 45 seconds, is able to come into approximately 45 degrees of knee flexion from sit to stand; 05/31/2023- Patient has not attempted standing in 1 month but able to perform x 4 trials with max A +2 (PT and his mom) and able to clear his bottom off mat and attempted to activate his glutes  and apply some forearm pressure - able to stand between 25-40 sec today.  Goal status: PROGRESSING Target date: 08/23/2023   8. Patient will tolerate sitting unsupported demonstrating ability to perform dynamic UE/LE activities for 30 minutes  independently to demonstrate ability to sit at edge of bed to eat or perform some ADL's/exercise for optimal quality of life.             Baseline: 06/06/2022- Patient able to sit and perform some UE/LE exercises but requires CGA at times for safety- he is able to static sit for 30 min with supervision. 07/27/2022= Patient able to now sit > 30 min with Supervision only - performing static and dynamic activities. Will keep goal active to ensure patient is consistent. 09/07/2022=  Patient able to demonstrate consistent ability to sit at edge of mat during visits             Goal Status: MET             Target date: 08/29/2022        Plan     Clinical Impression Statement Progression toward goals has been guarded due to missed visits for various reason- sickness,  transportation issues, and inclement weather conditions. Despite missed visits patient has made progress demonstrating good motivation today to stand x 4 trials - each over 25 sec - clearing his bottom from mat with max assist of 2 people to stand. He also presents with improving right LE quad strength as seen by near full ROM with long arc quads. He is limited by some UE weakness and tone with limited ability to use his arms to assist with standing. Patient's condition has the potential to improve in response to therapy. Maximum improvement is yet to be obtained. The anticipated improvement is attainable and reasonable in a generally predictable time. Anticipate continued work on some lateral scooting and continued work on sit to stand and static stand to prepare for future work with pivot transfer and progress to pregait activities. May perform trial of stedy device to assist with standing in near future.  Pt will continue to benefit from skilled physical therapy intervention to address impairments, improve QOL, and attain therapy goals.    Personal Factors and Comorbidities Comorbidity 3+;Time since onset of injury/illness/exacerbation    Comorbidities CVA, diabetes, Seizures    Examination-Activity Limitations Bathing;Bed Mobility;Bend;Caring for Others;Carry;Dressing;Hygiene/Grooming;Lift;Locomotion Level;Reach Overhead;Self Feeding;Sit;Squat;Stairs;Stand;Transfers;Toileting    Examination-Participation Restrictions Cleaning;Community Activity;Driving;Laundry;Medication Management;Meal Prep;Occupation;Personal Finances;Shop;Yard Work;Volunteer    Stability/Clinical Decision Making Evolving/Moderate complexity    Rehab Potential Fair    PT Frequency 2x / week    PT Duration 12 weeks    PT Treatment/Interventions ADLs/Self Care Home Management;Cryotherapy;Electrical Stimulation;Moist Heat;Ultrasound;DME Instruction;Gait training;Stair training;Functional mobility training;Therapeutic exercise;Balance  training;Patient/family education;Orthotic Fit/Training;Neuromuscular re-education;Wheelchair mobility training;Manual techniques;Passive range of motion;Dry needling;Energy conservation;Taping;Visual/perceptual remediation/compensation;Joint Manipulations    PT Next Visit Plan core strength/motor control in short-sitting, sit to stand if able; Continue with progressive LE Strengthening. Stand activities as appropriate. Attempt Stedy (sit to stand device) in future visits.   PT Home Exercise Plan No changes to HEP today    Consulted and Agree with Plan of Care Patient;Family member/caregiver    Family Member Consulted             03/09/2023, 8:04 AM      12:13 PM, 06/01/23 Lenda Kelp PT Physical Therapist - [facility]  12:13 PM 06/01/23

## 2023-05-31 NOTE — Therapy (Signed)
 Occupational Therapy Progress Note  Dates of reporting period  03/08/2023   to   05/31/2023    Patient Name: Paul Hall MRN: [id number] DOB:1995/11/10, 28 y.o., male Today's Date: 05/31/2023  PCP: Dr. Sherwood Gambler REFERRING PROVIDER: Dr. Sherwood Gambler    OT End of Session - 05/31/23 1706     Visit Number 80    Number of Visits 132    Date for OT Re-Evaluation 08/02/23    Authorization Type 09/26/2022    OT Start Time 1540    OT Stop Time 1615    OT Time Calculation (min) 35 min    Equipment Utilized During Treatment tilt in space power wc    Activity Tolerance Patient tolerated treatment well    Behavior During Therapy WFL for tasks assessed/performed                    Past Medical History:  Diagnosis Date   Diabetes mellitus (HCC)    Hypertension    Stroke [facility])    Past Surgical History:  Procedure Laterality Date   IVC FILTER PLACEMENT ([facility] HX)     LEG SURGERY     PEG TUBE PLACEMENT     TRACHEOSTOMY     Patient Active Problem List   Diagnosis Date Noted   Sepsis due to vancomycin resistant Enterococcus species (HCC) 06/06/2019   SIRS (systemic inflammatory response syndrome) (HCC) 06/05/2019   Acute lower UTI 06/05/2019   VRE (vancomycin-resistant Enterococci) infection 06/05/2019   Anemia 06/05/2019   Skin ulcer of sacrum with necrosis of muscle (HCC)    Urinary retention    Type 2 diabetes mellitus without complication, with long-term current use of insulin (HCC)    Tachycardia    Lower extremity edema    Acute metabolic encephalopathy    Obstructive sleep apnea    Morbid obesity with BMI of 60.0-69.9, adult (HCC)    Goals of care, counseling/discussion    Palliative care encounter    Sepsis (HCC) 04/27/2019   H/O insulin dependent diabetes mellitus 04/27/2019   History of CVA with residual deficit 04/27/2019   Seizure disorder (HCC) 04/27/2019   Decubitus ulcer of sacral region, stage 4 (HCC) 04/27/2019   ONSET DATE: 01/2019  REFERRING DIAG:  CVA/COVID-19  THERAPY DIAG:  Muscle weakness (generalized)  Other lack of coordination  Rationale for Evaluation and Treatment Rehabilitation  SUBJECTIVE:   SUBJECTIVE STATEMENT:  Pt.  reports doing well today  Pt accompanied by: self and family member  PERTINENT HISTORY:  Pt. is a 28 y.o. male who was diagnosed with COVID-19, and CVA with resultant quadriplegia. Pt. Was then hospitalized with VRE UTI. PMHx includes: urinary retention, seizure disorder, obstructive sleep disorder, DM Type II, Morbid obesity.   PRECAUTIONS: None  WEIGHT BEARING RESTRICTIONS No  PAIN:  No reports of pain Are you having pain? 0/10  FALLS: Has patient fallen in last 6 months? No  LIVING ENVIRONMENT: Lives with: lives with their family Lives in: House/apartment Stairs: No Level Entry Has following equipment at home: Wheelchair (power) and hoyer lift, sit to stand lift  PLOF: Independent  PATIENT GOALS: To be able to engage in more daily care tasks.  OBJECTIVE:   HAND DOMINANCE: Right  ADLs:  Transfers/ambulation related to ADLs: Eating: Pt. reports being able to hold standard utensils, and is starting to engage more in self-feeding tasks, hand to mouth patterns. Pt. Reports that he does as much as he can with the task, and family assists  with the remainder  of the task. Grooming: Pt. Is able to initiate holding an electric toothbrush, and brush his teeth. Family assists LB Dressing: Total Assist UE dressing: Pt. is now able to reach up to actively assist with grasping , and pulling his gown down. Toileting:  Total Assist Bathing: MaxA UB, Total assist LB Tub Shower transfers: N/A Equipment: See above    IADLs: Shopping: Relies on family to assist Light housekeeping: Total Assist Meal Prep: Total Assist Community mobility:   Medication management:  Total Assist  Financial management: N/A Handwriting: Not legible: Pt. Is able to hold a pen with the left hand, and initiate  marking the page. Pt.'s eye glasses were not available  MOBILITY STATUS:  Power w/c  POSTURE COMMENTS:  Pt. Requires position changes in his power w/c  ACTIVITY TOLERANCE: Activity tolerance:  Fair  FUNCTIONAL OUTCOME MEASURES: FOTO: 40  TR score: 45  05/25/2022:   FOTO: 50 TR score: 45  07/20/22: FOTO 55  03/08/23: 51  UPPER EXTREMITY ROM     Active ROM Left eval Left  03/21/2022 Left 05/25/2022 L  07/20/22 Left 07/25/2022 Left  08/15/2022 Left  09/26/2022 Left  11/16/2022 Left 12/28/2022 Left 03/13/23 Left 05/10/23  Shoulder flexion 119 123 125 130  136 138 130 142 144 144  Shoulder abduction 110 115 115 115  118 121 125 142 140 140  Shoulder adduction             Shoulder extension             Shoulder internal rotation             Shoulder external rotation             Elbow flexion 135(135) 145 145 145  145    145 145  Elbow extension -27(-18) -21(-20) -20(-16)  -25(-10) -23(-10) -21(-10) -20(-18) -20(-18) -20(-17) -17(-16)  Wrist flexion             Wrist extension 10(50) 19(50) 28(50)  30(50) 35(55) 36(55) 36(60) 40(60) 45(60) 45(60)  Wrist ulnar deviation             Wrist radial deviation             Wrist pronation             Wrist supination   Limited by flexor tone       10(15)   (Blank rows = not tested)    Active ROM Right eval Right 03/21/2022 Right 05/25/2022 R  07/20/22 Right  07/25/22 Right 08/15/2022 Right 09/26/2022 Right 11/16/2022 Right 12/28/2022 Right 03/13/23 Right 05/10/23  Shoulder flexion 106 scaption 105  Scaption 62 flexion 110 scaption 100  105 105 105 112 Scaption 123 Scaption 122  Scaption  Shoulder abduction 114 90 97 100  105 117 120 124 124 122  Shoulder adduction             Shoulder extension             Shoulder internal rotation             Shoulder external rotation             Elbow flexion 120(130) 145 145 140  144 140 142 148 148 145  Elbow extension -45(-35) -22(-35) -28(-22)  -32(-21) -32(-28) -28(-26) -28(-28)  -27(-26) -27(-40) -38(-35)  Wrist flexion             Wrist extension -30(10) -25(10) -10(30)  -10(20) -10(25) -20(40) -30(10) -22(34) -22(35) -32(22)  Wrist ulnar deviation  Wrist radial deviation             Wrist pronation             Wrist supination   Limited by flexor tone            12/28/2022: Thumb radial abduction: Right: 12(46), Left: 40(54)  Digits: Bilateral 2nd through 5th digit: MP Hyperextension with PIP, an DIP flexion     UPPER EXTREMITY MMT:     Left eval Left 03/21/2022 Left  05/25/2022 L 07/20/22 Left  08/15/2022 Left 09/26/2022 Left 11/16/2022 Left 12/28/2022 Left  03/13/2023 Left  05/10/23  Shoulder flexion 3-/5 3/5 3+/5 4+/5 4+/5 4+/5 4+/5 4+/5 4+/5 4+/5  Shoulder abduction 3-/5 3-/5 4-/5 4+/5 4+/5 4+5 4+/5 4+/5 4+/5 4+/5  Shoulder adduction            Shoulder extension            Shoulder internal rotation            Shoulder external rotation            Elbow flexion 3/5 3+/5 4-/5 4+/5 5/5 5/5 5/5 5/5 5/5 5/5  Elbow extension 2-/5 2/5  4+/5 4+/5 4+/5  4/5 4/5 4/5 through available range  Wrist flexion            Wrist extension 2/5 2+/5 3-/5  3-/5 3-/5 3-/5 3/5 3/5 3/5  Wrist ulnar deviation            Wrist radial deviation            Wrist pronation            Wrist supination            (Blank rows = not tested)  Right eval Right 03/21/2022 Right  05/25/2022 R 07/20/22 Right 08/15/2022 Right 09/26/2022 Right 11/16/2022 Right 12/28/2022 Right 03/13/2023 Right 05/10/23  Shoulder flexion 3-/5 scaption 3-/5 3-/5 3+/5 3+/5 3+/5 3/5 3/5 3/5 3+/5  Shoulder abduction 3-/5 3-/5 3-/5 4/5 4/5 4/5 4/5 4/5 4/5 4/-5  Shoulder adduction            Shoulder extension            Shoulder internal rotation            Shoulder external rotation            Elbow flexion 3/5 3/5 3+/5 4/5 4+/5 4+/5 4+/5 4+/5 4+/5 4+/5  Elbow extension 2-/5 2/5 3-/5 4+/5 4+/5 4+/5 4+/5 4-/5 4-/5 4-/5  Wrist flexion            Wrist extension 2-/5 2-/5 2/5 2/5 2/5 2/5 2/5  2/5 2/5 2/5  Wrist ulnar deviation            Wrist radial deviation            Wrist pronation            Wrist supination              HAND FUNCTION:  Grip strength: Right: 0 lbs; Left: 0 lbs and Lateral pinch: Right: 5 lbs, Left: 2 lbs  03/21/2022: Lateral pinch: Right: 3.5 lbs, Left: 2 lbs  07/20/22: Grip strength: Right: 3 lbs; Left: 4 lbs and Lateral pinch: Right: 5 lbs, Left: 3 lbs  08/15/2022:  Grip strength: Right: 0 lbs; Left: 5 lbs and Lateral pinch: Right: 2 lbs, Left: 4 lbs  09/26/2022  Grip strength: Right: 0 lbs; Left: 8 lbs and Lateral pinch: Right: 2 lbs, Left:  5 lbs  11/16/2022  Grip strength: Right: 0 lbs; Left: 8 lbs  9/25/20204  Grip strength: Right: 0 lbs; Left: 8 lbs  Grip strength: Right: 0 lbs; Left: 8 lbs   03/08/23:  Grip strength: Right: 0 lbs without PROM, 5 lbs after PROM, Left 8 lbs  05/10/23:  Grip strength: TBD  Bilateral digit PIP/DIP flexion contractures with MP hyperextension with attempts for AROM. Pt. is able to tolerate AROM to the bilateral digits at the initial evaluation however, has a history of pain in the digits.  COORDINATION: Eval: Pt. is unable to grasp 9-hole test pegs. Pt. is able to initiate grasping larger pegs, and is able to hold a pen in the left hand.  07/20/22: 2 min 36 seconds to remove 9 pegs from 9 hole peg test - cues to locate pegs 2/2 low vision. Pt. is able to initiate grasping larger pegs on R hand and is able to hold a pen in the left hand.  08/15/2022:    Vision, and sensation limiting accuracy of 9 hole peg test results. Pt. Was able to grasp and remove vertical pegs intermittently with cues.    05/10/23: TBD  SENSATION: Light touch: Impaired   EDEMA:  N/A  MUSCLE TONE: BUE flexor Spasticity  COGNITION Overall cognitive status: Continue to assess in functional context  VISION:   Subjective report: Pt. was not wearing glasses at the time of the initial eval.  Baseline vision: Vision is very  limited. Wears glasses all the time Visual history: History of impaired vision following CVA. Pt. Has received treatment through the [facility] low vision rehabilitation program.   VISION ASSESSMENT: Impaired To be further assessed in functional context  PERCEPTION: Impaired   PRAXIS: Impaired: motor planning  OBSERVATIONS:  Pt reports being on Tramadol   TODAY'S TREATMENT    Therapeutic Ex.:   -Emphasis was placed on slow prolonged gentle passive stretching for RUE, forearm supination, bilateral wrist extension, wrist extension, digit MP, PIP, and DIP extension following moist heat. ROM was performed to decrease stiffness, and prepare the RUE for functional use, and to prepare the hand to assume a position of weightbearing/proprioception.    Self-care:  -UE dressing skills doffing a pullover sweatshirt  focusing on using the right hand to stabilize the left sleeve while working the left arm out of the sleeve elbow first. Then using the LUE to remove the sweatshirt over his head, and down the RUE.  Pt. required step-by step verbal, visual cues, and assist to progress his RUE stabilizing up the sleeve as Pt. progressed with working the elbow out. Pt. Required assist for loosening the sweatshirt from the back, and left shoulder. -Pt. Work on intermittent steps of donning the sweatshirt, threading his left arm through, and attempting to place it over his head.  -Increased cues, assist was required for donning the shirt, and aligning the seams.     PATIENT EDUCATION: Education details: stretching, PROM, massage to the digits on the bilateral hands. Person educated: Patient and Parent Education method: Explanation Education comprehension: verbalized understanding  HOME EXERCISE PROGRAM:  Continue ongoing assessment, and continue to provide as needed.   GOALS: Goals reviewed with patient? Yes  SHORT TERM GOALS: Target date: 11/07/2022   To assess splint fit, and make appropriate  adjustments to promote good skin integrity through the palmar surface of the bilateral hands.  Baseline: 05/25/22: Goal currently met, however ongoing as needs to assess splint fit arise. 03/23/2022: Pt. is wearing splints a couple of  hours at night bilateral resting hand splints. 03/21/2022: Pt. is wearing splints a couple of hours at night bilateral resting hand splints. Goal status: Deferred   LONG TERM GOALS: Target date: 08/02/2023    FOTO score will Improve by 2 points for Pt. perceived improvement with the assessment specific ADL/IADL tasks.  Baseline:  03/08/23: 51; 12/28/2022: FOTO score: 56; 11/16/2022: 52 09/27/2022: 51 07/20/2022: FOTO 55 05/25/2022: FOTO score: 50,  TR score: 45 Eval: FOTO score: 40,  TR score: 45 Goal status: Achieved   2.   Pt. will independently perform oral care for 100% of the task after complete set-up. Baseline: 05/10/23: Pt. Performs 90% of the task. Pt.'s mother assists with 10% of the task for thorough cleaning the hard to reach places way in the back of the oral cavity.03/08/23: not as much assist required proximally, but to adjust positioning of toothbrush; 02/13/23: Pt. Continues to require assist proximally 01/09/2023: Continue 12/28/2022:  Pt. Continues to complete 90% of the task with complete set-up, with visual cues, and occassional assist of the right elbow. 11/16/2022: Pt. Completes 90% of the task with complete set-up. 09/26/2022: Completes 90% of the task, proximal assist at times required at the elbow.  08/15/2022: Completes 90% of the task, proximal assist at times required at the elbow. 07/20/22: completes 90% of task, limited by shoulder flexion. 05/25/2022:  Pt. Is able to initiate and perform oral care for approximately 90% of the task. Complete set-up required. Assi needed only for the very back teeth. 03/23/2022: Pt. Is able to initiate and perform oral care for approximately 75% of the task. Pt. Requires assist at proximally at the elbow for through oral care.  03/21/2022: Pt. Is able to initiate and perform oral care for approximately 75% of the task. Pt. Requires assist at proximally at the elbow for through oral care. Eval: Pt. is able to initiate using an electric toothbrush. Pt. requires assist for set-up, and assist for thoroughness, and as he Pt. fatigues. Goal status:  Partially met, Goal discontinued  3.  Pt. Will be modified independence with self-feeding for 100% using a swivel spoon, and standard fork after complete set-up Baseline: 05/10/2023: Pt. feeds self 95-100% of the meal. with occasional assist to reposition the utensil. 03/08/23: indep with cup with a handle and lid, assist to reposition eating utensils in hand;  02/13/23:  Pt. Is now able to consistently, and efficiently use a swivel spoon for eating cereal with the left hand.01/09/2023: Continue 12/28/2022: Pt. Is now feeding himself 90% of the time using a swivel spoon with the left hand, and 95% of the time with the fork. Pt. Continues to have difficulty with cutting food. 11/16/2022: 100% with finger foods, Pt. Is initiating with a swivel spoon, and universal cuff for fork use, however requires assist for consistency, and accuracy.  09/26/2022: 90% using the spoon, assist at time with the forearm motion, and grip on the utensils. 100% for finger foods.  08/15/2022:  90% using the spoon, assit at time with the forearm motion, and grip on the utensils. 100% for finger foods-independent with set-up- unsupervised.  07/20/22: self-feeds cereal using spoon 90% of task. 05/25/2022: Pt. Is able to use a spoon to scoop cereal when feeding himself cereal 85% of the time. Pt. Is able to feed himself snack/finger foods 100% of the time. Pt. Continues to work on consistency of  stabilizing a cup/mug when drinking. Pt. Is able to grasp a water bottle with assist initially, with assist tapering off as  he drinks.03/23/2022: Pt. Is able to perform scooping cereal for 75% of the time. Pt. required assist, and support  at the left elbow, and Pt. Presents with limited forearm supination when using the spoon, and bringing it towards his mouth. Pt. Is able to use a fork to spear items, and perform the hand to mouth pattern.  03/21/2022: Pt. Is able to perform scooping cereal for 75% of the time. Pt. required assist, and support at the left elbow, and Pt. presents with limited forearm supination when using the spoon, and bringing it towards his mouth. Pt. Is able to use a fork to spear items, and perform the hand to mouth pattern.  Eval: Pt. is able to hold standard standard utensils. Pt. Performs as much of the task as he, can and has assistance for the remainder. Goal status:  Improved, Achieved  4.  Pt. will improve grasp patterns and consistently grasp 1/4" objects for ADL, and IADL tasks.  Baseline: 05/31/2023: Continue 05/10/23: Pt. is consistently able to grasp 1/2" objects with visual cues. Pt. Requires assist, and increased visual cues to grasp 1/4" objects on an elevated plane.  Pt. Is unable to grasp the flat at the tabletop surface. Pt. Is grasping grasping small puppy vitamins.03/08/23: 1/2" objects efficiently; 02/13/2023: 1/2" objects efficiently 01/09/2023: Continue 12/28/2022: Continues to grasp 1/4" pegs with min a + visual cues, consistently grasping 1/2" objects with visual cues 11/16/2022: Continues to grasp 1/4" pegs with min a + visual cues, consistently grasping 1/2" objects with visual cues 09/26/2022: Continues to grasp 1/4" pegs with min a + visual cues, consistently grasping 1/2" objects with visual cues. 08/15/2022: grasps 1/4" pegs with min a + visual cues, consistently grasping 1/2" objects with visual cues. 07/20/22: grasps 1/4" pegs with min a + visual cues, consistently grasping 1/2" objects with visual cues. 05/25/2022: Pt. Is working on improving consistency of grasping 1/2" objects with visual cues.  03/23/2022: Pt. Is able to grasp 1" objects consistently,and continues to work on the hand patterns  needed to grasp 1/2" objects.03/21/2022: Pt. Is able to grasp 1" objects consistently,and continues to work on the hand patterns needed to grasp 1/2" objects. Eval: Pt. is able to grasp 1" objects intermittently using a lateral grasp pattern. Goal status: Ongoing  5.  Pt. will independently write his name legibly with letter sizes under 1". Baseline: 05/10/23: Deferred 03/08/23: Not addressed this period; 02/13/2023: Continue 01/09/2023: Continue. 12/28/2022: Pt. is now using an IPad Pen. 11/16/2022: Pt. Continues to be able to write name with smaller letter size. Pt. Is signing name with a computer styus. Pt. Requires visual cues, and assist 09/26/2022: Pt. Continues to be able to write name with smaller letter size. Pt. Is signing name with a computer styus. Pt. Requires visual cues, and assist. 08/15/2022: Pt. Is able to write name with smaller letter size. Pt. Is signing name with a computer styus. Pt. Requires visual cues, and assist. 07/20/22: stabilizing assist to write name with 75% legibility with 2" letters. 05/25/2022: Pt. to continue to work towards formulating grasp patterns in preparation for grasping a large width pen. Pt. Requires visual cues. 03/23/2022: Pt. Is able to write his name with modA, however has difficulty with formulating letter sizes less than 2" in size with 50% legibility for the 3 letters of his name.03/21/2022: Pt. Is able to write his name with modA, however has difficulty with formulating letter sizes less than 2" in size with 50% legibility for the 3 letters of his name. Eval:  Pt is able to hold a thin marker with his left hand, and formulate a line, and initiate a circular pattern (Pt. without glasses today) Goal status: Deferred  6. Pt. Will reach up to comb/brush his hair with minA.  Baseline: 05/10/23: Pt. is able to use a pick independently with the left hand with occasional assist to the far side of the left his head. 03/08/23: Pt uses pick in L hand to reach 75% of his head;  unable to reach R side of head; 02/13/2023:Pt. is able to use a hair pick with the left hand on the right side of his head, top, and back of the head. Pt. Continues to have difficulty reaching further to the right side of his head with the left. 01/09/2023: Continue 12/28/2022: Pt. Is progressing, and is now able to use a hair pick with the left hand on the right side of his head, top, and back of the head. Pt. Continues to have difficulty reaching further to the right side of his head with the left. 11/16/2022: Pt. Is able to use a hair pick for the left side of his head using his left hand. Pt. Presents with difficulty reaching to the right side of his head.09/26/2022: Pt. Is able to use a hair pick for the left side of his head using his left hand. Pt. Presents with difficulty reaching to the right side of his head. 5/13/202: 75% using a hair pick, assist to the right side of the head. 07/20/22: reaches 75% of head, assist for far R side of head, fatigues quickly. 05/25/2022: Pt. Is able to reach up with the left hand to the left side, top, and back of his head. 03/23/2022: Pt. is now able to more consistently initiate reaching up to his head with his left hand in preparation for haircare12/18/2023: Pt. is now able to more consistently initiate reaching up to his head with his left hand in preparation for haircare. Eval: Pt. is able to initiate reaching up for hair care with a long handled brush, however is unable to sustain UE's in elevation to perform the task.     Goal status: Achieved  7. Pt. Will independently navigate the w/c through his environment with minA with visual scanning, and hand placement on the controls.  Baseline: 11/16/2022: Deferred 2/2 vision 09/26/2022: Pt. Is now navigating his w/c ouside the home, and around his block. 08/15/2022: Pt. Requires minA to setup hand on controls and MIN cues to navigate the w/c in wide spaces, requires MIN - MOD cues to navigate the w/c through more narrow  doorways, and tighter turns - varies based on good vs bad vision days.07/20/22: Pt. Requires minA to setup hand on controls and MIN cues to navigate the w/c in wide spaces, requires MIN - MOD cues to navigate the w/c through more narrow doorways, and tighter turns - varies based on good vs bad vision days. 05/25/2022: Pt. Requires minA  and  cues to navigate the w/c in wide spaces, and requires Mod cues to navigate the w/c through more narrow doorways, and tighter turns. Pt. Requires max cues for scanning through the environment, and moderate cues for hand placement on the controls.     Goal status: Deferred 2/2 vision    8. Pt. Will improve bilateral grip strength to be able to independently grasp, and pull up blankets, and linens.   Baseline: 05/10/23: Deferred 03/08/23: R grip 0-5 lbs, L grip 8 lbs; pt reports he can consistently pull up blankets  and linens independently.  02/13/2023: Continue. Pt. presents with digit PIP, and DIP flexor tightness. 01/09/2023: Continue 12/28/2023: R: 0#, L: 5# Pt. Is now able to more consistently grasp and pull blankets up over him. 11/16/2022: R: 0 L: 8# Pt. Is more consistently grasping his blankets and attempting to pull them up. 09/26/2022: R: 0  L: 8# Pt. is attempting to grasp, and pull blankets up more, using mostly the left hand. 08/15/2022: Pt. has difficulty securely holding, and pulling up blankets, and linens.    Goal status:Deferred  9. Pt. will consistently actively control the releasing of blankets, covers, and linens from his hands once they are in the desired position over him. Baseline:12/28/2022: Pt. Is consistently actively able to release linens once they are in the desired position over him. 11/16/2022: Pt. continues to improve with actively releasing blankets form his hands. 09/26/2022: Pt. is improving with actively releasing blankets form his hands. 08/15/2022: Pt. has difficulty with controlled releasing of blankets/linens from his grasp.     Goal status:  Achieved    10. Pt. Will improve bilateral UE strength by 2 MM grades to assist with ADLs, and IADLs  Baseline: 05/31/2023: Continue 05/10/23: Right: shoulder flexion: 3+/5, abduction: 44-/5, elbow flexion: 4+/5 , extension: 4-/5 wrist extension: 2/5; Left: shoulder flexion: 4+/5, abduction: 4+/5, elbow flexion: 5/5 , extension: 4/5  wrist extension: 3/5 03/13/2023: Right: shoulder flexion: 3/5, abduction: 4/5, elbow flexion: 4+/5 , extension: 4-/5 wrist extension: 2/5; Left: shoulder flexion: 4+/5, abduction: 4+/5, elbow flexion: 5/5 , extension: 4/5  wrist extension: 3/5 03/08/23: NT this date d/t as pt arrived late; 02/13/2023: Continue 01/09/2023: Continue 12/28/2022: Right: shoulder flexion: 3/5, abduction: 4/5, elbow flexion: 4+/5 , extension: 4-/5 wrist extension: 2/5; Left: shoulder flexion: 4+/5, abduction: 4+/5, elbow flexion: 5/5 , extension: 4/5  wrist extension: 3/5 Right: shoulder flexion: 3+/5, abduction: 4/5, elbow flexion: 4+/5 , extension: 4+/5 wrist extension: 2/5; Left: shoulder flexion: 4+/5, abduction: 4+/5, elbow flexion: 5/5 , extension: 4+/5 wrist extension: 3-/5 11/16/2022: Right: shoulder flexion: 3/5, abduction: 4/5, elbow flexion: 4+/5 , extension: 4+/5 wrist extension: 2/5; Left: shoulder flexion: 4+/5, abduction: 4+/5, elbow flexion: 5/5 , extension:  wrist extension: 3-/5 Right: shoulder flexion: 3+/5, abduction: 4/5, elbow flexion: 4+/5 , extension: 4+/5 wrist extension: 2/5; Left: shoulder flexion: 4+/5, abduction: 4+/5, elbow flexion: 5/5 , extension: 4+/5 wrist extension: 3-/5 Goal status: Ongoing  11. Pt. will improve right wrist extension by 10 degrees to initiate reaching for items in preparation for ADLs. Baseline: 05/31/23: Continue 05/10/23: Right wrist extension: -32(22) 03/13/23: -22(35) 03/08/23: NT this date as pt arrived late; 02/13/2023: Continue 01/09/2023: Continue 12/28/2022: -22(34) 11/16/2022: -30(10)    Goal status: Ongoing      12. Pt. Will require donn/doff a  Tank top T-shirt efficiently with supervision and full set-up.    Baseline: 05/31/2023: Pt. Requires maxA donning a pullover sweatshirt, Mod-maxA doffing it. T-shirt tank TBD 05/10/23: Pt. Presents with difficulty completing, and requires increased time to complete    Goal status: New    13. Pt. Will will independently perform bilateral/bimanual hand tasks needed to fold towels/linens/laundry Baseline:05/31/23: Continue 05/10/23:  Pt. Has difficulty folding towels/lines/laundry using bilateral hands Goal status: New  14. Pt. Will independently, and efficiently reach his right arm over the side of the chair to pet his dog.    Baseline: 05/31/2023: Continue 05/10/23: Pt. Is unable to efficiently reach his right UE over the side of his recliner chair to pet his dog.    Goal status: New  15. Pt. Will independently, and efficiently reach out in front of him to efficiently place items onto his table while sitting in his recliner.     Baseline: 2/26/225: Continue 05/10/23: Pt. has difficulty reaching out in front of him far enough to efficiently place items on the table while sitting in a recliner chair.    Goal status: New           ASSESSMENT:  CLINICAL IMPRESSION:  Pt. continues to make steady progress. Pt. is improving with doffing his T-shirt Tank. Pt. has started working on donning and doffing a Freight forwarder. Pt. required Mod-MaxA with step-by step verbal, visual cues, and assist to progress his RUE stabilizing up the sleeve as Pt. progressed with working the elbow out. Pt. Required assist for loosening the sweatshirt from the back, and left shoulder. Increased cues, assist was required with MaxA to attempt donning the sweatshirt with, and aligning the seams. Pt. recertification was completed only 2 treatment visit sessions ago. Treatment POC was updated with new goals added to the POC. Pt. to continue with the same treatment plan to work towards new updated goals. Pt. continues to be  highly motivated to continue working towards improving BUE functioning, ROM, strength, motor control, [facility] skills, as well as visual compensatory strategies in order to maximize independence with daily ADLs, and IADL functioning.      PERFORMANCE DEFICITS in functional skills including ADLs, IADLs, coordination, dexterity, proprioception, ROM, strength, pain, FMC, GMC, decreased knowledge of use of DME, and UE functional use, cognitive skills including safety awareness, and psychosocial skills including coping strategies, environmental adaptation, habits, and routines and behaviors.   IMPAIRMENTS are limiting patient from ADLs, IADLs, leisure, and social participation.   COMORBIDITIES may have co-morbidities  that affects occupational performance. Patient will benefit from skilled OT to address above impairments and improve overall function.  MODIFICATION OR ASSISTANCE TO COMPLETE EVALUATION: Maximum or significant modification of tasks or assist is necessary to complete an evaluation.  OT OCCUPATIONAL PROFILE AND HISTORY: Comprehensive assessment: Review of records and extensive additional review of physical, cognitive, psychosocial history related to current functional performance.  CLINICAL DECISION MAKING: High - multiple treatment options, significant modification of task necessary  REHAB POTENTIAL: Fair    EVALUATION COMPLEXITY: High    PLAN: OT FREQUENCY: 2 x's a week  OT DURATION:12 weeks  PLANNED INTERVENTIONS: self care/ADL training, therapeutic exercise, therapeutic activity, neuromuscular re-education, manual therapy, passive range of motion, patient/family education, and cognitive remediation/compensation RECOMMENDED OTHER SERVICES: PT  CONSULTED AND AGREED WITH PLAN OF CARE: Patient and family member/caregiver  PLAN FOR NEXT SESSION: Review HEP  Olegario Messier, MS, OTR/L  05/15/2023

## 2023-06-01 DIAGNOSIS — H4901 Third [oculomotor] nerve palsy, right eye: Secondary | ICD-10-CM | POA: Diagnosis not present

## 2023-06-01 DIAGNOSIS — H540X54 Blindness right eye category 5, blindness left eye category 4: Secondary | ICD-10-CM | POA: Diagnosis not present

## 2023-06-01 DIAGNOSIS — H469 Unspecified optic neuritis: Secondary | ICD-10-CM | POA: Diagnosis not present

## 2023-06-01 DIAGNOSIS — H4720 Unspecified optic atrophy: Secondary | ICD-10-CM | POA: Diagnosis not present

## 2023-06-05 ENCOUNTER — Ambulatory Visit: Payer: Medicare Other

## 2023-06-05 ENCOUNTER — Ambulatory Visit: Payer: Medicare Other | Admitting: Occupational Therapy

## 2023-06-07 ENCOUNTER — Ambulatory Visit: Payer: Medicare Other | Admitting: Occupational Therapy

## 2023-06-07 ENCOUNTER — Ambulatory Visit: Payer: Medicare Other

## 2023-06-12 ENCOUNTER — Ambulatory Visit: Payer: Medicare Other | Admitting: Occupational Therapy

## 2023-06-12 ENCOUNTER — Ambulatory Visit: Payer: Medicare Other | Attending: Physician Assistant

## 2023-06-12 DIAGNOSIS — R2681 Unsteadiness on feet: Secondary | ICD-10-CM | POA: Insufficient documentation

## 2023-06-12 DIAGNOSIS — R262 Difficulty in walking, not elsewhere classified: Secondary | ICD-10-CM | POA: Diagnosis not present

## 2023-06-12 DIAGNOSIS — M6281 Muscle weakness (generalized): Secondary | ICD-10-CM | POA: Diagnosis not present

## 2023-06-12 DIAGNOSIS — R278 Other lack of coordination: Secondary | ICD-10-CM | POA: Diagnosis not present

## 2023-06-12 DIAGNOSIS — I6930 Unspecified sequelae of cerebral infarction: Secondary | ICD-10-CM | POA: Diagnosis not present

## 2023-06-12 DIAGNOSIS — R269 Unspecified abnormalities of gait and mobility: Secondary | ICD-10-CM | POA: Insufficient documentation

## 2023-06-12 DIAGNOSIS — R2689 Other abnormalities of gait and mobility: Secondary | ICD-10-CM | POA: Insufficient documentation

## 2023-06-12 NOTE — Therapy (Addendum)
 Occupational Therapy Neuro Treatment Note     Patient Name: Paul Hall MRN: [id number] DOB:02-24-1996, 28 y.o., male Today's Date: 06/12/2023  PCP: Dr. Sherwood Gambler REFERRING PROVIDER: Dr. Sherwood Gambler    OT End of Session - 06/12/23 1707     Visit Number [id number]    Number of Visits 132    Date for OT Re-Evaluation 08/02/23    Authorization Type 09/26/2022    OT Start Time 1622    OT Stop Time 1700    OT Time Calculation (min) 38 min    Equipment Utilized During Treatment tilt in space power wc    Activity Tolerance Patient tolerated treatment well    Behavior During Therapy WFL for tasks assessed/performed                    Past Medical History:  Diagnosis Date   Diabetes mellitus (HCC)    Hypertension    Stroke [facility])    Past Surgical History:  Procedure Laterality Date   IVC FILTER PLACEMENT ([facility] HX)     LEG SURGERY     PEG TUBE PLACEMENT     TRACHEOSTOMY     Patient Active Problem List   Diagnosis Date Noted   Sepsis due to vancomycin resistant Enterococcus species (HCC) 06/06/2019   SIRS (systemic inflammatory response syndrome) (HCC) 06/05/2019   Acute lower UTI 06/05/2019   VRE (vancomycin-resistant Enterococci) infection 06/05/2019   Anemia 06/05/2019   Skin ulcer of sacrum with necrosis of muscle (HCC)    Urinary retention    Type 2 diabetes mellitus without complication, with long-term current use of insulin (HCC)    Tachycardia    Lower extremity edema    Acute metabolic encephalopathy    Obstructive sleep apnea    Morbid obesity with BMI of 60.0-69.9, adult (HCC)    Goals of care, counseling/discussion    Palliative care encounter    Sepsis (HCC) 04/27/2019   H/O insulin dependent diabetes mellitus 04/27/2019   History of CVA with residual deficit 04/27/2019   Seizure disorder (HCC) 04/27/2019   Decubitus ulcer of sacral region, stage 4 (HCC) 04/27/2019   ONSET DATE: 01/2019  REFERRING DIAG: CVA/COVID-19  THERAPY DIAG:  Muscle weakness  (generalized)  Rationale for Evaluation and Treatment Rehabilitation  SUBJECTIVE:   SUBJECTIVE STATEMENT:  Pt. reports having had a massage using a massage gun this week.  Pt accompanied by: self and family member  PERTINENT HISTORY:  Pt. is a 28 y.o. male who was diagnosed with COVID-19, and CVA with resultant quadriplegia. Pt. Was then hospitalized with VRE UTI. PMHx includes: urinary retention, seizure disorder, obstructive sleep disorder, DM Type II, Morbid obesity.   PRECAUTIONS: None  WEIGHT BEARING RESTRICTIONS No  PAIN:  No reports of pain Are you having pain? 0/10  FALLS: Has patient fallen in last 6 months? No  LIVING ENVIRONMENT: Lives with: lives with their family Lives in: House/apartment Stairs: No Level Entry Has following equipment at home: Wheelchair (power) and hoyer lift, sit to stand lift  PLOF: Independent  PATIENT GOALS: To be able to engage in more daily care tasks.  OBJECTIVE:   HAND DOMINANCE: Right  ADLs:  Transfers/ambulation related to ADLs: Eating: Pt. reports being able to hold standard utensils, and is starting to engage more in self-feeding tasks, hand to mouth patterns. Pt. Reports that he does as much as he can with the task, and family assists  with the remainder of the task. Grooming: Pt. Is able to initiate holding  an electric toothbrush, and brush his teeth. Family assists LB Dressing: Total Assist UE dressing: Pt. is now able to reach up to actively assist with grasping , and pulling his gown down. Toileting:  Total Assist Bathing: MaxA UB, Total assist LB Tub Shower transfers: N/A Equipment: See above    IADLs: Shopping: Relies on family to assist Light housekeeping: Total Assist Meal Prep: Total Assist Community mobility:   Medication management:  Total Assist  Financial management: N/A Handwriting: Not legible: Pt. Is able to hold a pen with the left hand, and initiate marking the page. Pt.'s eye glasses were not  available  MOBILITY STATUS:  Power w/c  POSTURE COMMENTS:  Pt. Requires position changes in his power w/c  ACTIVITY TOLERANCE: Activity tolerance:  Fair  FUNCTIONAL OUTCOME MEASURES: FOTO: 40  TR score: 45  05/25/2022:   FOTO: 50 TR score: 45  07/20/22: FOTO 55  03/08/23: 51  UPPER EXTREMITY ROM     Active ROM Left eval Left  03/21/2022 Left 05/25/2022 L  07/20/22 Left 07/25/2022 Left  08/15/2022 Left  09/26/2022 Left  11/16/2022 Left 12/28/2022 Left 03/13/23 Left 05/10/23  Shoulder flexion 119 123 125 130  136 138 130 142 144 144  Shoulder abduction 110 115 115 115  118 121 125 142 140 140  Shoulder adduction             Shoulder extension             Shoulder internal rotation             Shoulder external rotation             Elbow flexion 135(135) 145 145 145  145    145 145  Elbow extension -27(-18) -21(-20) -20(-16)  -25(-10) -23(-10) -21(-10) -20(-18) -20(-18) -20(-17) -17(-16)  Wrist flexion             Wrist extension 10(50) 19(50) 28(50)  30(50) 35(55) 36(55) 36(60) 40(60) 45(60) 45(60)  Wrist ulnar deviation             Wrist radial deviation             Wrist pronation             Wrist supination   Limited by flexor tone       10(15)   (Blank rows = not tested)    Active ROM Right eval Right 03/21/2022 Right 05/25/2022 R  07/20/22 Right  07/25/22 Right 08/15/2022 Right 09/26/2022 Right 11/16/2022 Right 12/28/2022 Right 03/13/23 Right 05/10/23  Shoulder flexion 106 scaption 105  Scaption 62 flexion 110 scaption 100  105 105 105 112 Scaption 123 Scaption 122  Scaption  Shoulder abduction 114 90 97 100  105 117 120 124 124 122  Shoulder adduction             Shoulder extension             Shoulder internal rotation             Shoulder external rotation             Elbow flexion 120(130) 145 145 140  144 140 142 148 148 145  Elbow extension -45(-35) -22(-35) -28(-22)  -32(-21) -32(-28) -28(-26) -28(-28) -27(-26) -27(-40) -38(-35)  Wrist flexion              Wrist extension -30(10) -25(10) -10(30)  -10(20) -10(25) -20(40) -30(10) -22(34) -22(35) -32(22)  Wrist ulnar deviation  Wrist radial deviation             Wrist pronation             Wrist supination   Limited by flexor tone            12/28/2022: Thumb radial abduction: Right: 12(46), Left: 40(54)  Digits: Bilateral 2nd through 5th digit: MP Hyperextension with PIP, an DIP flexion     UPPER EXTREMITY MMT:     Left eval Left 03/21/2022 Left  05/25/2022 L 07/20/22 Left  08/15/2022 Left 09/26/2022 Left 11/16/2022 Left 12/28/2022 Left  03/13/2023 Left  05/10/23  Shoulder flexion 3-/5 3/5 3+/5 4+/5 4+/5 4+/5 4+/5 4+/5 4+/5 4+/5  Shoulder abduction 3-/5 3-/5 4-/5 4+/5 4+/5 4+5 4+/5 4+/5 4+/5 4+/5  Shoulder adduction            Shoulder extension            Shoulder internal rotation            Shoulder external rotation            Elbow flexion 3/5 3+/5 4-/5 4+/5 5/5 5/5 5/5 5/5 5/5 5/5  Elbow extension 2-/5 2/5  4+/5 4+/5 4+/5  4/5 4/5 4/5 through available range  Wrist flexion            Wrist extension 2/5 2+/5 3-/5  3-/5 3-/5 3-/5 3/5 3/5 3/5  Wrist ulnar deviation            Wrist radial deviation            Wrist pronation            Wrist supination            (Blank rows = not tested)  Right eval Right 03/21/2022 Right  05/25/2022 R 07/20/22 Right 08/15/2022 Right 09/26/2022 Right 11/16/2022 Right 12/28/2022 Right 03/13/2023 Right 05/10/23  Shoulder flexion 3-/5 scaption 3-/5 3-/5 3+/5 3+/5 3+/5 3/5 3/5 3/5 3+/5  Shoulder abduction 3-/5 3-/5 3-/5 4/5 4/5 4/5 4/5 4/5 4/5 4/-5  Shoulder adduction            Shoulder extension            Shoulder internal rotation            Shoulder external rotation            Elbow flexion 3/5 3/5 3+/5 4/5 4+/5 4+/5 4+/5 4+/5 4+/5 4+/5  Elbow extension 2-/5 2/5 3-/5 4+/5 4+/5 4+/5 4+/5 4-/5 4-/5 4-/5  Wrist flexion            Wrist extension 2-/5 2-/5 2/5 2/5 2/5 2/5 2/5 2/5 2/5 2/5  Wrist ulnar deviation             Wrist radial deviation            Wrist pronation            Wrist supination              HAND FUNCTION:  Grip strength: Right: 0 lbs; Left: 0 lbs and Lateral pinch: Right: 5 lbs, Left: 2 lbs  03/21/2022: Lateral pinch: Right: 3.5 lbs, Left: 2 lbs  07/20/22: Grip strength: Right: 3 lbs; Left: 4 lbs and Lateral pinch: Right: 5 lbs, Left: 3 lbs  08/15/2022:  Grip strength: Right: 0 lbs; Left: 5 lbs and Lateral pinch: Right: 2 lbs, Left: 4 lbs  09/26/2022  Grip strength: Right: 0 lbs; Left: 8 lbs and Lateral pinch: Right: 2 lbs, Left:  5 lbs  11/16/2022  Grip strength: Right: 0 lbs; Left: 8 lbs  9/25/20204  Grip strength: Right: 0 lbs; Left: 8 lbs  Grip strength: Right: 0 lbs; Left: 8 lbs   03/08/23:  Grip strength: Right: 0 lbs without PROM, 5 lbs after PROM, Left 8 lbs  05/10/23:  Grip strength: TBD  Bilateral digit PIP/DIP flexion contractures with MP hyperextension with attempts for AROM. Pt. is able to tolerate AROM to the bilateral digits at the initial evaluation however, has a history of pain in the digits.  COORDINATION: Eval: Pt. is unable to grasp 9-hole test pegs. Pt. is able to initiate grasping larger pegs, and is able to hold a pen in the left hand.  07/20/22: 2 min 36 seconds to remove 9 pegs from 9 hole peg test - cues to locate pegs 2/2 low vision. Pt. is able to initiate grasping larger pegs on R hand and is able to hold a pen in the left hand.  08/15/2022:    Vision, and sensation limiting accuracy of 9 hole peg test results. Pt. Was able to grasp and remove vertical pegs intermittently with cues.    05/10/23: TBD  SENSATION: Light touch: Impaired   EDEMA:  N/A  MUSCLE TONE: BUE flexor Spasticity  COGNITION Overall cognitive status: Continue to assess in functional context  VISION:   Subjective report: Pt. was not wearing glasses at the time of the initial eval.  Baseline vision: Vision is very limited. Wears glasses all the time Visual  history: History of impaired vision following CVA. Pt. Has received treatment through the [facility] low vision rehabilitation program.   VISION ASSESSMENT: Impaired To be further assessed in functional context  PERCEPTION: Impaired   PRAXIS: Impaired: motor planning  OBSERVATIONS:  Pt reports being on Tramadol   TODAY'S TREATMENT      Therapeutic Ex.:   Pt. performed AROM for right shoulder flexion, abduction, and right sided pectoral stretching. Emphasis was placed on slow prolonged gentle passive stretching for RUE, forearm supination, bilateral wrist extension, wrist extension, digit MP, PIP, and DIP extension following moist heat. ROM was performed to decrease stiffness, and prepare the RUE for functional use. ROM was performed following moist heat to the shoulder right elbow, forearm, wrist, and digits. PROM for bilateral digit MP, PIP, and DIP extension in preparation for placing them onto a flat surface.    PATIENT EDUCATION: Education details: stretching, PROM, massage to the digits on the bilateral hands. Person educated: Patient and Parent Education method: Explanation Education comprehension: verbalized understanding  HOME EXERCISE PROGRAM:  Continue ongoing assessment, and continue to provide as needed.   GOALS: Goals reviewed with patient? Yes  SHORT TERM GOALS: Target date: 11/07/2022   To assess splint fit, and make appropriate adjustments to promote good skin integrity through the palmar surface of the bilateral hands.  Baseline: 05/25/22: Goal currently met, however ongoing as needs to assess splint fit arise. 03/23/2022: Pt. is wearing splints a couple of hours at night bilateral resting hand splints. 03/21/2022: Pt. is wearing splints a couple of hours at night bilateral resting hand splints. Goal status: Deferred   LONG TERM GOALS: Target date: 08/02/2023    FOTO score will Improve by 2 points for Pt. perceived improvement with the assessment specific ADL/IADL  tasks.  Baseline:  03/08/23: 51; 12/28/2022: FOTO score: 56; 11/16/2022: 52 09/27/2022: 51 07/20/2022: FOTO 55 05/25/2022: FOTO score: 50,  TR score: 45 Eval: FOTO score: 40,  TR score: 45 Goal status: Achieved  2.   Pt. will independently perform oral care for 100% of the task after complete set-up. Baseline: 05/10/23: Pt. Performs 90% of the task. Pt.'s mother assists with 10% of the task for thorough cleaning the hard to reach places way in the back of the oral cavity.03/08/23: not as much assist required proximally, but to adjust positioning of toothbrush; 02/13/23: Pt. Continues to require assist proximally 01/09/2023: Continue 12/28/2022:  Pt. Continues to complete 90% of the task with complete set-up, with visual cues, and occassional assist of the right elbow. 11/16/2022: Pt. Completes 90% of the task with complete set-up. 09/26/2022: Completes 90% of the task, proximal assist at times required at the elbow.  08/15/2022: Completes 90% of the task, proximal assist at times required at the elbow. 07/20/22: completes 90% of task, limited by shoulder flexion. 05/25/2022:  Pt. Is able to initiate and perform oral care for approximately 90% of the task. Complete set-up required. Assi needed only for the very back teeth. 03/23/2022: Pt. Is able to initiate and perform oral care for approximately 75% of the task. Pt. Requires assist at proximally at the elbow for through oral care. 03/21/2022: Pt. Is able to initiate and perform oral care for approximately 75% of the task. Pt. Requires assist at proximally at the elbow for through oral care. Eval: Pt. is able to initiate using an electric toothbrush. Pt. requires assist for set-up, and assist for thoroughness, and as he Pt. fatigues. Goal status:  Partially met, Goal discontinued  3.  Pt. Will be modified independence with self-feeding for 100% using a swivel spoon, and standard fork after complete set-up Baseline: 05/10/2023: Pt. feeds self 95-100% of the meal. with  occasional assist to reposition the utensil. 03/08/23: indep with cup with a handle and lid, assist to reposition eating utensils in hand;  02/13/23:  Pt. Is now able to consistently, and efficiently use a swivel spoon for eating cereal with the left hand.01/09/2023: Continue 12/28/2022: Pt. Is now feeding himself 90% of the time using a swivel spoon with the left hand, and 95% of the time with the fork. Pt. Continues to have difficulty with cutting food. 11/16/2022: 100% with finger foods, Pt. Is initiating with a swivel spoon, and universal cuff for fork use, however requires assist for consistency, and accuracy.  09/26/2022: 90% using the spoon, assist at time with the forearm motion, and grip on the utensils. 100% for finger foods.  08/15/2022:  90% using the spoon, assit at time with the forearm motion, and grip on the utensils. 100% for finger foods-independent with set-up- unsupervised.  07/20/22: self-feeds cereal using spoon 90% of task. 05/25/2022: Pt. Is able to use a spoon to scoop cereal when feeding himself cereal 85% of the time. Pt. Is able to feed himself snack/finger foods 100% of the time. Pt. Continues to work on consistency of  stabilizing a cup/mug when drinking. Pt. Is able to grasp a water bottle with assist initially, with assist tapering off as he drinks.03/23/2022: Pt. Is able to perform scooping cereal for 75% of the time. Pt. required assist, and support at the left elbow, and Pt. Presents with limited forearm supination when using the spoon, and bringing it towards his mouth. Pt. Is able to use a fork to spear items, and perform the hand to mouth pattern.  03/21/2022: Pt. Is able to perform scooping cereal for 75% of the time. Pt. required assist, and support at the left elbow, and Pt. presents with limited forearm supination when using the  spoon, and bringing it towards his mouth. Pt. Is able to use a fork to spear items, and perform the hand to mouth pattern.  Eval: Pt. is able to hold  standard standard utensils. Pt. Performs as much of the task as he, can and has assistance for the remainder. Goal status:  Improved, Achieved  4.  Pt. will improve grasp patterns and consistently grasp 1/4" objects for ADL, and IADL tasks.  Baseline: 05/31/2023: Continue 05/10/23: Pt. is consistently able to grasp 1/2" objects with visual cues. Pt. Requires assist, and increased visual cues to grasp 1/4" objects on an elevated plane.  Pt. Is unable to grasp the flat at the tabletop surface. Pt. Is grasping grasping small puppy vitamins.03/08/23: 1/2" objects efficiently; 02/13/2023: 1/2" objects efficiently 01/09/2023: Continue 12/28/2022: Continues to grasp 1/4" pegs with min a + visual cues, consistently grasping 1/2" objects with visual cues 11/16/2022: Continues to grasp 1/4" pegs with min a + visual cues, consistently grasping 1/2" objects with visual cues 09/26/2022: Continues to grasp 1/4" pegs with min a + visual cues, consistently grasping 1/2" objects with visual cues. 08/15/2022: grasps 1/4" pegs with min a + visual cues, consistently grasping 1/2" objects with visual cues. 07/20/22: grasps 1/4" pegs with min a + visual cues, consistently grasping 1/2" objects with visual cues. 05/25/2022: Pt. Is working on improving consistency of grasping 1/2" objects with visual cues.  03/23/2022: Pt. Is able to grasp 1" objects consistently,and continues to work on the hand patterns needed to grasp 1/2" objects.03/21/2022: Pt. Is able to grasp 1" objects consistently,and continues to work on the hand patterns needed to grasp 1/2" objects. Eval: Pt. is able to grasp 1" objects intermittently using a lateral grasp pattern. Goal status: Ongoing  5.  Pt. will independently write his name legibly with letter sizes under 1". Baseline: 05/10/23: Deferred 03/08/23: Not addressed this period; 02/13/2023: Continue 01/09/2023: Continue. 12/28/2022: Pt. is now using an IPad Pen. 11/16/2022: Pt. Continues to be able to write name with  smaller letter size. Pt. Is signing name with a computer styus. Pt. Requires visual cues, and assist 09/26/2022: Pt. Continues to be able to write name with smaller letter size. Pt. Is signing name with a computer styus. Pt. Requires visual cues, and assist. 08/15/2022: Pt. Is able to write name with smaller letter size. Pt. Is signing name with a computer styus. Pt. Requires visual cues, and assist. 07/20/22: stabilizing assist to write name with 75% legibility with 2" letters. 05/25/2022: Pt. to continue to work towards formulating grasp patterns in preparation for grasping a large width pen. Pt. Requires visual cues. 03/23/2022: Pt. Is able to write his name with modA, however has difficulty with formulating letter sizes less than 2" in size with 50% legibility for the 3 letters of his name.03/21/2022: Pt. Is able to write his name with modA, however has difficulty with formulating letter sizes less than 2" in size with 50% legibility for the 3 letters of his name. Eval: Pt is able to hold a thin marker with his left hand, and formulate a line, and initiate a circular pattern (Pt. without glasses today) Goal status: Deferred  6. Pt. Will reach up to comb/brush his hair with minA.  Baseline: 05/10/23: Pt. is able to use a pick independently with the left hand with occasional assist to the far side of the left his head. 03/08/23: Pt uses pick in L hand to reach 75% of his head; unable to reach R side of head; 02/13/2023:Pt. is able  to use a hair pick with the left hand on the right side of his head, top, and back of the head. Pt. Continues to have difficulty reaching further to the right side of his head with the left. 01/09/2023: Continue 12/28/2022: Pt. Is progressing, and is now able to use a hair pick with the left hand on the right side of his head, top, and back of the head. Pt. Continues to have difficulty reaching further to the right side of his head with the left. 11/16/2022: Pt. Is able to use a hair pick  for the left side of his head using his left hand. Pt. Presents with difficulty reaching to the right side of his head.09/26/2022: Pt. Is able to use a hair pick for the left side of his head using his left hand. Pt. Presents with difficulty reaching to the right side of his head. 5/13/202: 75% using a hair pick, assist to the right side of the head. 07/20/22: reaches 75% of head, assist for far R side of head, fatigues quickly. 05/25/2022: Pt. Is able to reach up with the left hand to the left side, top, and back of his head. 03/23/2022: Pt. is now able to more consistently initiate reaching up to his head with his left hand in preparation for haircare12/18/2023: Pt. is now able to more consistently initiate reaching up to his head with his left hand in preparation for haircare. Eval: Pt. is able to initiate reaching up for hair care with a long handled brush, however is unable to sustain UE's in elevation to perform the task.     Goal status: Achieved  7. Pt. Will independently navigate the w/c through his environment with minA with visual scanning, and hand placement on the controls.  Baseline: 11/16/2022: Deferred 2/2 vision 09/26/2022: Pt. Is now navigating his w/c ouside the home, and around his block. 08/15/2022: Pt. Requires minA to setup hand on controls and MIN cues to navigate the w/c in wide spaces, requires MIN - MOD cues to navigate the w/c through more narrow doorways, and tighter turns - varies based on good vs bad vision days.07/20/22: Pt. Requires minA to setup hand on controls and MIN cues to navigate the w/c in wide spaces, requires MIN - MOD cues to navigate the w/c through more narrow doorways, and tighter turns - varies based on good vs bad vision days. 05/25/2022: Pt. Requires minA  and  cues to navigate the w/c in wide spaces, and requires Mod cues to navigate the w/c through more narrow doorways, and tighter turns. Pt. Requires max cues for scanning through the environment, and moderate cues  for hand placement on the controls.     Goal status: Deferred 2/2 vision    8. Pt. Will improve bilateral grip strength to be able to independently grasp, and pull up blankets, and linens.   Baseline: 05/10/23: Deferred 03/08/23: R grip 0-5 lbs, L grip 8 lbs; pt reports he can consistently pull up blankets and linens independently.  02/13/2023: Continue. Pt. presents with digit PIP, and DIP flexor tightness. 01/09/2023: Continue 12/28/2023: R: 0#, L: 5# Pt. Is now able to more consistently grasp and pull blankets up over him. 11/16/2022: R: 0 L: 8# Pt. Is more consistently grasping his blankets and attempting to pull them up. 09/26/2022: R: 0  L: 8# Pt. is attempting to grasp, and pull blankets up more, using mostly the left hand. 08/15/2022: Pt. has difficulty securely holding, and pulling up blankets, and linens.  Goal status:Deferred  9. Pt. will consistently actively control the releasing of blankets, covers, and linens from his hands once they are in the desired position over him. Baseline:12/28/2022: Pt. Is consistently actively able to release linens once they are in the desired position over him. 11/16/2022: Pt. continues to improve with actively releasing blankets form his hands. 09/26/2022: Pt. is improving with actively releasing blankets form his hands. 08/15/2022: Pt. has difficulty with controlled releasing of blankets/linens from his grasp.     Goal status: Achieved    10. Pt. Will improve bilateral UE strength by 2 MM grades to assist with ADLs, and IADLs  Baseline: 05/31/2023: Continue 05/10/23: Right: shoulder flexion: 3+/5, abduction: 44-/5, elbow flexion: 4+/5 , extension: 4-/5 wrist extension: 2/5; Left: shoulder flexion: 4+/5, abduction: 4+/5, elbow flexion: 5/5 , extension: 4/5  wrist extension: 3/5 03/13/2023: Right: shoulder flexion: 3/5, abduction: 4/5, elbow flexion: 4+/5 , extension: 4-/5 wrist extension: 2/5; Left: shoulder flexion: 4+/5, abduction: 4+/5, elbow flexion: 5/5 ,  extension: 4/5  wrist extension: 3/5 03/08/23: NT this date d/t as pt arrived late; 02/13/2023: Continue 01/09/2023: Continue 12/28/2022: Right: shoulder flexion: 3/5, abduction: 4/5, elbow flexion: 4+/5 , extension: 4-/5 wrist extension: 2/5; Left: shoulder flexion: 4+/5, abduction: 4+/5, elbow flexion: 5/5 , extension: 4/5  wrist extension: 3/5 Right: shoulder flexion: 3+/5, abduction: 4/5, elbow flexion: 4+/5 , extension: 4+/5 wrist extension: 2/5; Left: shoulder flexion: 4+/5, abduction: 4+/5, elbow flexion: 5/5 , extension: 4+/5 wrist extension: 3-/5 11/16/2022: Right: shoulder flexion: 3/5, abduction: 4/5, elbow flexion: 4+/5 , extension: 4+/5 wrist extension: 2/5; Left: shoulder flexion: 4+/5, abduction: 4+/5, elbow flexion: 5/5 , extension:  wrist extension: 3-/5 Right: shoulder flexion: 3+/5, abduction: 4/5, elbow flexion: 4+/5 , extension: 4+/5 wrist extension: 2/5; Left: shoulder flexion: 4+/5, abduction: 4+/5, elbow flexion: 5/5 , extension: 4+/5 wrist extension: 3-/5 Goal status: Ongoing  11. Pt. will improve right wrist extension by 10 degrees to initiate reaching for items in preparation for ADLs. Baseline: 05/31/23: Continue 05/10/23: Right wrist extension: -32(22) 03/13/23: -22(35) 03/08/23: NT this date as pt arrived late; 02/13/2023: Continue 01/09/2023: Continue 12/28/2022: -22(34) 11/16/2022: -30(10)    Goal status: Ongoing      12. Pt. Will require donn/doff a Tank top T-shirt efficiently with supervision and full set-up.    Baseline: 05/31/2023: Pt. Requires maxA donning a pullover sweatshirt, Mod-maxA doffing it. T-shirt tank TBD 05/10/23: Pt. Presents with difficulty completing, and requires increased time to complete    Goal status: New    13. Pt. Will will independently perform bilateral/bimanual hand tasks needed to fold towels/linens/laundry Baseline:05/31/23: Continue 05/10/23:  Pt. Has difficulty folding towels/lines/laundry using bilateral hands Goal status: New  14. Pt. Will  independently, and efficiently reach his right arm over the side of the chair to pet his dog.    Baseline: 05/31/2023: Continue 05/10/23: Pt. Is unable to efficiently reach his right UE over the side of his recliner chair to pet his dog.    Goal status: New    15. Pt. Will independently, and efficiently reach out in front of him to efficiently place items onto his table while sitting in his recliner.     Baseline: 2/26/225: Continue 05/10/23: Pt. has difficulty reaching out in front of him far enough to efficiently place items on the table while sitting in a recliner chair.    Goal status: New           ASSESSMENT:  CLINICAL IMPRESSION:  Pt. reports having an appointment scheduled for Botox Injections this  month. Pt. 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 Pt. tolerated STM to the digits, moist heat, and ROM. Pt. did present with reports of  tenderness initially with wrist extension, and PIP extension. Pt. continues to be highly motivated to continue working towards improving BUE functioning, ROM, strength, motor control, [facility] skills, as well as visual compensatory strategies in order to maximize independence with daily ADLs, and IADL functioning.      PERFORMANCE DEFICITS in functional skills including ADLs, IADLs, coordination, dexterity, proprioception, ROM, strength, pain, FMC, GMC, decreased knowledge of use of DME, and UE functional use, cognitive skills including safety awareness, and psychosocial skills including coping strategies, environmental adaptation, habits, and routines and behaviors.   IMPAIRMENTS are limiting patient from ADLs, IADLs, leisure, and social participation.   COMORBIDITIES may have co-morbidities  that affects occupational performance. Patient will benefit from skilled OT to address above impairments and improve overall function.  MODIFICATION OR  ASSISTANCE TO COMPLETE EVALUATION: Maximum or significant modification of tasks or assist is necessary to complete an evaluation.  OT OCCUPATIONAL PROFILE AND HISTORY: Comprehensive assessment: Review of records and extensive additional review of physical, cognitive, psychosocial history related to current functional performance.  CLINICAL DECISION MAKING: High - multiple treatment options, significant modification of task necessary  REHAB POTENTIAL: Fair    EVALUATION COMPLEXITY: High    PLAN: OT FREQUENCY: 2 x's a week  OT DURATION:12 weeks  PLANNED INTERVENTIONS: self care/ADL training, therapeutic exercise, therapeutic activity, neuromuscular re-education, manual therapy, passive range of motion, patient/family education, and cognitive remediation/compensation RECOMMENDED OTHER SERVICES: PT  CONSULTED AND AGREED WITH PLAN OF CARE: Patient and family member/caregiver  PLAN FOR NEXT SESSION: Review HEP  Olegario Messier, MS, OTR/L  06/12/2023

## 2023-06-12 NOTE — Therapy (Signed)
 OUTPATIENT PHYSICAL THERAPY TREATMENT    Patient Name: Paul Hall MRN: [id number] DOB:[birth year], 28 y.o., male Today's Date: 06/13/2023  PCP:  Cindi Carbon PROVIDER:  Lenise Herald, PA-C   PT End of Session - 06/12/23 1013     Visit Number 149    Number of Visits 160   date corrected from most recent recert   Date for PT Re-Evaluation 08/23/23    Authorization Type [employer] COMM [employer]/ [place] Medicaid    Authorization Time Period 01/04/21-03/29/21; Recert 03/24/2021-06/16/2021; Recert 09/15/2021- 12/08/2021; Recert 12/13/2021-03/07/2022    Progress Note Due on Visit 150    PT Start Time 1531    Equipment Utilized During Treatment Gait belt    Activity Tolerance Patient tolerated treatment well    Behavior During Therapy WFL for tasks assessed/performed                            Past Medical History:  Diagnosis Date   Diabetes mellitus (HCC)    Hypertension    Stroke [facility])    Past Surgical History:  Procedure Laterality Date   IVC FILTER PLACEMENT ([facility] HX)     LEG SURGERY     PEG TUBE PLACEMENT     TRACHEOSTOMY     Patient Active Problem List   Diagnosis Date Noted   Sepsis due to vancomycin resistant Enterococcus species (HCC) 06/06/2019   SIRS (systemic inflammatory response syndrome) (HCC) 06/05/2019   Acute lower UTI 06/05/2019   VRE (vancomycin-resistant Enterococci) infection 06/05/2019   Anemia 06/05/2019   Skin ulcer of sacrum with necrosis of muscle (HCC)    Urinary retention    Type 2 diabetes mellitus without complication, with long-term current use of insulin (HCC)    Tachycardia    Lower extremity edema    Acute metabolic encephalopathy    Obstructive sleep apnea    Morbid obesity with BMI of 60.0-69.9, adult (HCC)    Goals of care, counseling/discussion    Palliative care encounter    Sepsis (HCC) 04/27/2019   H/O insulin dependent diabetes mellitus 04/27/2019   History of CVA with residual deficit 04/27/2019   Seizure disorder  (HCC) 04/27/2019   Decubitus ulcer of sacral region, stage 4 (HCC) 04/27/2019    REFERRING DIAG: Cerebral infarction, unspecified   THERAPY DIAG:  Muscle weakness (generalized)  Other lack of coordination  Difficulty in walking, not elsewhere classified  Unsteadiness on feet  Abnormality of gait and mobility  Other abnormalities of gait and mobility  Rationale for Evaluation and Treatment Rehabilitation  PERTINENT HISTORY: Paul Hall is a 26yoM who presents with severe weakness, quadriparesis, altered sensorium, and visual impairment s/p critical illness and prolonged hospitalization. Pt hospitalized in October 2020 with ARDS 2/2 COVID19 infection. Pt sustained a complex and lengthy hospitalization which included tracheostomy, prolonged sedation, ECMO. In this period pt sustained CVA and SDH. Pt has now been liberated from tracheostomy and G-tube. Pt has since been hospitalized for wound infection and UTI. Pt lives with parents at home, has hospital bed and left chair, hoyer lift transfers, and power WC for mobility needs. Pt needs heavy physical assistance with ADL 2/2 BUE contractures and motor dysfunction   PRECAUTIONS: Fall  SUBJECTIVE:  Patient reports feeling very tight today  and requesting some stretching and then maybe some resistive training.   PAIN:  Are you having pain? No. But feeling tight.    TODAY'S TREATMENT:  - 06/13/23     THEREX:  Passive stretching  to each ankle, knee, and hip- flex/ext and rotation for each LE  x 18 min  LE strengthening: (in power chair today)   Ankle DF/PF- Active 2 sets of 12 reps each LE Seated quad sets with 5 sec hold- 2 sets of 10 reps Seated straight leg hip abd (Author holding heel to keep leg straight) - ACTIVE- 2 sets of 12 reps each Seated Manual resistive hip add- Hold 5 sec 2 sets x 10 reps  Seated assisted heel slides 2 sets of 10 reps each LE Resistive left knee ext (2.5#) 2 sets of 12 reps with slow eccentric  control and active RLE (no weight) 2 sets of 12 reps                PATIENT EDUCATION: Education details: Exercise technique with VC   HOME EXERCISE PROGRAM:  Access Code: [email or web address] URL: [email or web address] Date: 03/23/2022 Prepared by: Maureen Ralphs  Exercises - Supine Bridge  - 3 x weekly - 3 sets - 10 reps - 2 hold - Supine Gluteal Sets  - 3 x weekly - 3 sets - 10 reps - 5 sec hold - Supine Quad Set  - 1 x daily - 3 x weekly - 3 sets - 10 reps - 5 hold - Seated Long Arc Quad  - 1 x daily - 7 x weekly - 3 sets - 10 reps - Seated Hip Adduction Squeeze with Ball  - 1 x daily - 3 x weekly - 3 sets - 10 reps - 5 hold - Seated Hip Abduction  - 1 x daily - 3 x weekly - 3 sets - 10 reps - 2 hold    PT Short Term Goals -       PT SHORT TERM GOAL #1   Title Pt will be independent with HEP in order to improve strength and balance in order to decrease fall risk and improve function at home and work.    Baseline 01/04/2021= No formal HEP in place; 12/12 no HEP in place; 05/10/2021-Patient and his father were able to report compliance with curent HEP consisting of mostly seated/reclined LE strengthening. Both verbalize no questions at this time.    Time 6    Period Weeks    Status Achieved    Target Date 02/15/21            PT LONG TERM GOAL #1     Title Patient will increase BLE gross strength by 1/2 muscle grade to improve functional strength for improved independence with potential gait, increased standing tolerance and increased ADL ability.     Baseline 01/04/2021- Patient presents with 1/5 to 3-/5 B LE strength with MMT; 12/12: goal partially met for Left knee/hip; 05/10/2021= 2-/5 bilateal Hip flex; 3+/5 bilateral Knee ext; 06/21/2021= Patient presents with 2-/5 bilateral Hip flex; 3+/5 bilateral knee ext/flex; 2-/5 left ankle DF; 0/5 right ankle- and able to increase reps and resistance with LE's. 09/15/2021- Patient technically presents with 2-/5 B hip  flex/abd/add - but he is able to raise his hip up to approx 100 deg which has improved. 3+/5 Bilateral knee ext, 2-/5 left ankle and 0/5 right ankle.  12/08/2021= Patient able to lift left knee at 110 deg of hip flex; presents with 3+/5 knee ext, 2-/5 left ankle DF and 0/5 right ankle DF, 2-/5 bilateral Hip abd in seated position.    12/6: R: knee 3+/5 ext, 2/5 flexion, left knee 3+/5 extension, 3+/5 flexion, R hip: 2+/5 hip add, 2+/5 hip ABD L  hip: 4-/5 hip ABD, 3+/5 hip ADD, 3+/5 hip flexion; 06/06/2022= Patient now presents with 2-/5 right ankle DF/PF;     Time 12     Period Weeks     Status MET    Target Date 03/07/2022         PT LONG TERM GOAL #2    Title Patient will tolerate sitting unsupported demonstrating erect sitting posture for 15 minutes with CGA to demonstrate improved back extensor strength and improved sitting tolerance.     Baseline 01/04/2021- Patient confied to sitting in lift chair or electric power chair with back support and unable to sit upright without physical assistance; 12/12: tolerates <1 minutes upright unsupported sitting. 05/10/2021=static sit with forward trunk lean  in his power wheelchair without back support x approx 3 min. 06/21/2021=Unable to assess today due to patient with acute back pain but on previous visit able to sit x 8 min without back support. 09/15/2021- on last visit- 09/13/2021- patient was able to sit unsupported x 8 min at edge of mat. 10/13/2023 - Patient was able to sit at edge of mat with varying level of assist today from SBA to min A for a total of 20 min. 12/13/2021= Patient demonstrated unsupported sitting at edge of mat for approx 20 min    Time 12     Period Weeks     Status GOAL MET    Target Date 12/08/21          PT LONG TERM GOAL #3    Title Patient will demonstrate ability to perform static standing in // bars > 2 min with Max Assist  without loss of balance and fair posture for improved overall strength for pre-gait and transfer activities.      Baseline 01/04/2021= Patient current uanble to stand- Dependent on hoyer or sit to stand lift for transfers. 05/10/2021=Not appropriate yet- Currently still dependent with all transfers using hoyer. 06/21/2021= Patient continuing now to focus on LE strengthening to prepare for standing-unable to try today due to acute low back pain-  planning on attempting in new cert period. 09/15/2021- Patient has attempted standing 2x in past two week- max Assist of 2 people - only once was he successful to clearing his bottom from chair - Will continue to be a focus during the new certification. 12/13/2021= Patient has been limited secondary to increased overall low back pain during this certification and will require more time to focus on this goal.  12/6: not assessed this date, will assess at date when 2-3 PTs are present for assistance     Time 12     Period Weeks     Status GOAL not appropriate at this time - may attempt in future once Patient presents with improved overall LE strength.                 PT LONG TERM GOAL #4    Title Pt will improve FOTO score by 10 points or more demonstrating improved perceived functional ability     Baseline FOTO 7 on 10/17; 03/15/21: FOTO 12; 05/10/2021 06/21/2021= 1; 09/15/2021= 9; 12/13/2021= Will issue next visit 12/6: 4; 06/06/2022= Will assess next visit; Goal not appropriate as patient is not ambulatory.     Time 12     Period Weeks     Status WITHDRAWN    Target date 11/30/2022         PT LONG TERM GOAL #5    Title Patient will perform sit to stand transfer  with appropriate AD and max assist of 2 people with 75% consistency to prepare for pregait activities.     Baseline 09/15/2021= Patient unable to stand well- unable to clear his bottom off chair with Max assist of 2 persons. 12/13/2021- Goal not appropriate to try yet but will keep and roll over to next cert as shift continues to focus on transfers/standing; 4/24= Patient able to perform active ankle DF/PF with right LE and  able to raise his knee into seated march and clear floor without physical assist today - as previously unable as well as lift right knee ext to near full ROM to improve strength for eventual standing. 09/07/2022- Goal continues to not be appropriate but patient is now standing some and will keep goal active; 11/23/2022= patient is now performing sit to stand with max A +2 and now able to clear his bottom off mat. 11/30/2022- Patient continues to perform sit to stand transfers with Max A+2 and able to clear bottom well off mat but not erect yet. 05/31/2023- Patient is able to stand consistently with max A +2 - unable to achieve full knee or trunk ext yet is able to clear bottom off mat for improved LE weightbearing.     Time 12     Period Weeks     Status PROGRESSING    Target date  08/23/2023       PT LONG TERM GOAL #6  Title Patient will tolerate sitting unsupported demonstrating erect sitting posture for 30 minutes with CGA to demonstrate improved back extensor strength and improved sitting tolerance.   Baseline 12/13/2021= Patient demonstrated unsupported sitting at edge of mat for approx 20 min;  12/29/2021- Patient performed approx 30 min of dynamic sitting activities today. 06/06/2022= Patient demonstrated ability to sit and perform static and dynamic UE/LE movement with only Supervision.   Time 12   Period Weeks   Status GOAL MET           7.  Patient will tolerate 2 minutes or more of standing in sit to stand lift or with max assist +2 in order to indicate improved lower extremity weightbearing tolerance for progression to standing in parallel bars. Baseline: 1 minute on most recent stand 02/21/22; 06/06/2022- Patient did attempt today after complaining of right LE pain last week. He attempted 3 stands using sit to stand lift. - all over 1 min- last one approx 48 sec- stopped due to fatigue. More erect standing in lift today - still poor gluteal strength but able to activate glutes and extend hips  upon command but unable to hold > 5 sec. 07/28/2022- Will attempt standing next visit but patient has been able to stand since last progress note for up to 3 min at a time at his best. 09/07/2022-  attempted standing with max +2 and patient able to stand 15-20 sec so will now  revise goal; 11/23/2022- Patient at his best between last 2 visits was able to stand approx 50 sec with max A+2.  11/30/2022- Patient performed 4 trials of static standing - max A +2- clearing bottom and using forearms to bear weight  03/15/2023: Patient performed 2 trials of static standing - max A +2- clearing edge of mat and using forearms to bear weight for approximately 45 seconds, is able to come into approximately 45 degrees of knee flexion from sit to stand; 05/31/2023- Patient has not attempted standing in 1 month but able to perform x 4 trials with max A +2 (PT and  his mom) and able to clear his bottom off mat and attempted to activate his glutes and apply some forearm pressure - able to stand between 25-40 sec today.  Goal status: PROGRESSING Target date: 08/23/2023   8. Patient will tolerate sitting unsupported demonstrating ability to perform dynamic UE/LE activities for 30 minutes  independently to demonstrate ability to sit at edge of bed to eat or perform some ADL's/exercise for optimal quality of life.             Baseline: 06/06/2022- Patient able to sit and perform some UE/LE exercises but requires CGA at times for safety- he is able to static sit for 30 min with supervision. 07/27/2022= Patient able to now sit > 30 min with Supervision only - performing static and dynamic activities. Will keep goal active to ensure patient is consistent. 09/07/2022=  Patient able to demonstrate consistent ability to sit at edge of mat during visits             Goal Status: MET             Target date: 08/29/2022        Plan     Clinical Impression Statement Patient presents with good motivation for today's session. He presented with  increased overall LE tightness and soreness but responded well to therex with much improved LE flexibility and then able to perform some LE strengthening. Patient reported very fatigued at end of session today.  Pt will continue to benefit from skilled physical therapy intervention to address impairments, improve QOL, and attain therapy goals.    Personal Factors and Comorbidities Comorbidity 3+;Time since onset of injury/illness/exacerbation    Comorbidities CVA, diabetes, Seizures    Examination-Activity Limitations Bathing;Bed Mobility;Bend;Caring for Others;Carry;Dressing;Hygiene/Grooming;Lift;Locomotion Level;Reach Overhead;Self Feeding;Sit;Squat;Stairs;Stand;Transfers;Toileting    Examination-Participation Restrictions Cleaning;Community Activity;Driving;Laundry;Medication Management;Meal Prep;Occupation;Personal Finances;Shop;Yard Work;Volunteer    Stability/Clinical Decision Making Evolving/Moderate complexity    Rehab Potential Fair    PT Frequency 2x / week    PT Duration 12 weeks    PT Treatment/Interventions ADLs/Self Care Home Management;Cryotherapy;Electrical Stimulation;Moist Heat;Ultrasound;DME Instruction;Gait training;Stair training;Functional mobility training;Therapeutic exercise;Balance training;Patient/family education;Orthotic Fit/Training;Neuromuscular re-education;Wheelchair mobility training;Manual techniques;Passive range of motion;Dry needling;Energy conservation;Taping;Visual/perceptual remediation/compensation;Joint Manipulations    PT Next Visit Plan core strength/motor control in short-sitting, sit to stand if able; Continue with progressive LE Strengthening. Stand activities as appropriate. Attempt Stedy (sit to stand device) in future visits.   PT Home Exercise Plan No changes to HEP today    Consulted and Agree with Plan of Care Patient;Family member/caregiver    Family Member Consulted             03/09/2023, 8:04 AM      10:21 AM, 06/13/23 Lenda Kelp PT Physical Therapist - [facility]  10:21 AM 06/13/23

## 2023-06-14 ENCOUNTER — Ambulatory Visit: Payer: Medicare Other

## 2023-06-14 ENCOUNTER — Ambulatory Visit: Payer: Medicare Other | Admitting: Occupational Therapy

## 2023-06-14 DIAGNOSIS — R278 Other lack of coordination: Secondary | ICD-10-CM | POA: Diagnosis not present

## 2023-06-14 DIAGNOSIS — R2689 Other abnormalities of gait and mobility: Secondary | ICD-10-CM

## 2023-06-14 DIAGNOSIS — R2681 Unsteadiness on feet: Secondary | ICD-10-CM

## 2023-06-14 DIAGNOSIS — R269 Unspecified abnormalities of gait and mobility: Secondary | ICD-10-CM

## 2023-06-14 DIAGNOSIS — I6930 Unspecified sequelae of cerebral infarction: Secondary | ICD-10-CM

## 2023-06-14 DIAGNOSIS — M6281 Muscle weakness (generalized): Secondary | ICD-10-CM

## 2023-06-14 DIAGNOSIS — R262 Difficulty in walking, not elsewhere classified: Secondary | ICD-10-CM | POA: Diagnosis not present

## 2023-06-14 NOTE — Therapy (Addendum)
 OUTPATIENT PHYSICAL THERAPY TREATMENT/Physical Therapy Progress Note   Dates of reporting period  03/15/2023   to   06/14/2023     Patient Name: Paul Hall MRN: [id number] DOB:Dec 21, 1995, 28 y.o., male Today's Date: 06/15/2023  PCP:  Cindi Carbon PROVIDER:  Lenise Herald, PA-C   PT End of Session - 06/14/23 1701     Visit Number 150    Number of Visits 160   date corrected from most recent recert   Date for PT Re-Evaluation 08/23/23    Authorization Type [employer] [place] [employer]    Authorization Time Period 01/04/21-03/29/21; Recert 03/24/2021-06/16/2021; Recert 09/15/2021- 12/08/2021; Recert 12/13/2021-03/07/2022    Progress Note Due on Visit 160    PT Start Time 1617    PT Stop Time 1653    PT Time Calculation (min) 36 min    Equipment Utilized During Treatment Gait belt    Activity Tolerance Patient tolerated treatment well    Behavior During Therapy WFL for tasks assessed/performed                            Past Medical History:  Diagnosis Date   Diabetes mellitus (HCC)    Hypertension    Stroke (HCC)    Past Surgical History:  Procedure Laterality Date   IVC FILTER PLACEMENT ([facility] HX)     LEG SURGERY     PEG TUBE PLACEMENT     TRACHEOSTOMY     Patient Active Problem List   Diagnosis Date Noted   Sepsis due to vancomycin resistant Enterococcus species (HCC) 06/06/2019   SIRS (systemic inflammatory response syndrome) (HCC) 06/05/2019   Acute lower UTI 06/05/2019   VRE (vancomycin-resistant Enterococci) infection 06/05/2019   Anemia 06/05/2019   Skin ulcer of sacrum with necrosis of muscle (HCC)    Urinary retention    Type 2 diabetes mellitus without complication, with long-term current use of insulin (HCC)    Tachycardia    Lower extremity edema    Acute metabolic encephalopathy    Obstructive sleep apnea    Morbid obesity with BMI of 60.0-69.9, adult (HCC)    Goals of care, counseling/discussion    Palliative care encounter     Sepsis (HCC) 04/27/2019   H/O insulin dependent diabetes mellitus 04/27/2019   History of CVA with residual deficit 04/27/2019   Seizure disorder (HCC) 04/27/2019   Decubitus ulcer of sacral region, stage 4 (HCC) 04/27/2019    REFERRING DIAG: Cerebral infarction, unspecified   THERAPY DIAG:  Muscle weakness (generalized)  Other lack of coordination  Difficulty in walking, not elsewhere classified  Unsteadiness on feet  Abnormality of gait and mobility  Other abnormalities of gait and mobility  History of CVA with residual deficit  Rationale for Evaluation and Treatment Rehabilitation  PERTINENT HISTORY: Paul Hall is a 26yoM who presents with severe weakness, quadriparesis, altered sensorium, and visual impairment s/p critical illness and prolonged hospitalization. Pt hospitalized in October 2020 with ARDS 2/2 COVID19 infection. Pt sustained a complex and lengthy hospitalization which included tracheostomy, prolonged sedation, ECMO. In this period pt sustained CVA and SDH. Pt has now been liberated from tracheostomy and G-tube. Pt has since been hospitalized for wound infection and UTI. Pt lives with parents at home, has hospital bed and left chair, hoyer lift transfers, and power WC for mobility needs. Pt needs heavy physical assistance with ADL 2/2 BUE contractures and motor dysfunction   PRECAUTIONS: Fall  SUBJECTIVE:  Patient reports just feeling  off today.   PAIN:  Are you having pain? No. But feeling tight.    TODAY'S TREATMENT:  - 06/14/2023     *Patient dependently hoyer transfer from his power chair to edge of mat with +2 assist (mom +PT) and later same assist for transfer back to w/c from mat (using patient provided hoyer pad)  THERAPEUTIC ACTIVITIES:  - EOM- static sitting- Patient sat for approx 10-12 min but stated "I just don't feel comfortable." When asked to try to explain- He couldn't- PT +Mom tried to adjust his legs, the mat table height and positioned  him back a little more sitting on mat but nothing improved his status. He requested to be transferred back to Power wheelchair.   THEREX:   Seated straight leg hip abd (Author holding heel to keep leg straight) - ACTIVE- 2 sets of 12 reps each Seated active hip add- Hold 5 sec 2 sets x 10 reps  Seated assisted heel slides 2 sets of 10 reps each LE AROM  knee ext  2 sets of 15 reps with slow eccentric control                 PATIENT EDUCATION: Education details: Exercise technique with VC   HOME EXERCISE PROGRAM:  Access Code: [email or web address] URL: [email or web address] Date: 03/23/2022 Prepared by: Maureen Ralphs  Exercises - Supine Bridge  - 3 x weekly - 3 sets - 10 reps - 2 hold - Supine Gluteal Sets  - 3 x weekly - 3 sets - 10 reps - 5 sec hold - Supine Quad Set  - 1 x daily - 3 x weekly - 3 sets - 10 reps - 5 hold - Seated Long Arc Quad  - 1 x daily - 7 x weekly - 3 sets - 10 reps - Seated Hip Adduction Squeeze with Ball  - 1 x daily - 3 x weekly - 3 sets - 10 reps - 5 hold - Seated Hip Abduction  - 1 x daily - 3 x weekly - 3 sets - 10 reps - 2 hold    PT Short Term Goals -       PT SHORT TERM GOAL #1   Title Pt will be independent with HEP in order to improve strength and balance in order to decrease fall risk and improve function at home and work.    Baseline 01/04/2021= No formal HEP in place; 12/12 no HEP in place; 05/10/2021-Patient and his father were able to report compliance with curent HEP consisting of mostly seated/reclined LE strengthening. Both verbalize no questions at this time.    Time 6    Period Weeks    Status Achieved    Target Date 02/15/21            PT LONG TERM GOAL #1     Title Patient will increase BLE gross strength by 1/2 muscle grade to improve functional strength for improved independence with potential gait, increased standing tolerance and increased ADL ability.     Baseline 01/04/2021- Patient presents with 1/5 to 3-/5  B LE strength with MMT; 12/12: goal partially met for Left knee/hip; 05/10/2021= 2-/5 bilateal Hip flex; 3+/5 bilateral Knee ext; 06/21/2021= Patient presents with 2-/5 bilateral Hip flex; 3+/5 bilateral knee ext/flex; 2-/5 left ankle DF; 0/5 right ankle- and able to increase reps and resistance with LE's. 09/15/2021- Patient technically presents with 2-/5 B hip flex/abd/add - but he is able to raise his hip up to approx 100 deg which has improved.  3+/5 Bilateral knee ext, 2-/5 left ankle and 0/5 right ankle.  12/08/2021= Patient able to lift left knee at 110 deg of hip flex; presents with 3+/5 knee ext, 2-/5 left ankle DF and 0/5 right ankle DF, 2-/5 bilateral Hip abd in seated position.    12/6: R: knee 3+/5 ext, 2/5 flexion, left knee 3+/5 extension, 3+/5 flexion, R hip: 2+/5 hip add, 2+/5 hip ABD L hip: 4-/5 hip ABD, 3+/5 hip ADD, 3+/5 hip flexion; 06/06/2022= Patient now presents with 2-/5 right ankle DF/PF;     Time 12     Period Weeks     Status MET    Target Date 03/07/2022         PT LONG TERM GOAL #2    Title Patient will tolerate sitting unsupported demonstrating erect sitting posture for 15 minutes with CGA to demonstrate improved back extensor strength and improved sitting tolerance.     Baseline 01/04/2021- Patient confied to sitting in lift chair or electric power chair with back support and unable to sit upright without physical assistance; 12/12: tolerates <1 minutes upright unsupported sitting. 05/10/2021=static sit with forward trunk lean  in his power wheelchair without back support x approx 3 min. 06/21/2021=Unable to assess today due to patient with acute back pain but on previous visit able to sit x 8 min without back support. 09/15/2021- on last visit- 09/13/2021- patient was able to sit unsupported x 8 min at edge of mat. 10/13/2023 - Patient was able to sit at edge of mat with varying level of assist today from SBA to min A for a total of 20 min. 12/13/2021= Patient demonstrated unsupported  sitting at edge of mat for approx 20 min    Time 12     Period Weeks     Status GOAL MET    Target Date 12/08/21          PT LONG TERM GOAL #3    Title Patient will demonstrate ability to perform static standing in // bars > 2 min with Max Assist  without loss of balance and fair posture for improved overall strength for pre-gait and transfer activities.     Baseline 01/04/2021= Patient current uanble to stand- Dependent on hoyer or sit to stand lift for transfers. 05/10/2021=Not appropriate yet- Currently still dependent with all transfers using hoyer. 06/21/2021= Patient continuing now to focus on LE strengthening to prepare for standing-unable to try today due to acute low back pain-  planning on attempting in new cert period. 09/15/2021- Patient has attempted standing 2x in past two week- max Assist of 2 people - only once was he successful to clearing his bottom from chair - Will continue to be a focus during the new certification. 12/13/2021= Patient has been limited secondary to increased overall low back pain during this certification and will require more time to focus on this goal.  12/6: not assessed this date, will assess at date when 2-3 PTs are present for assistance     Time 12     Period Weeks     Status GOAL not appropriate at this time - may attempt in future once Patient presents with improved overall LE strength.                 PT LONG TERM GOAL #4    Title Pt will improve FOTO score by 10 points or more demonstrating improved perceived functional ability     Baseline FOTO 7 on 10/17; 03/15/21: FOTO 12; 05/10/2021 06/21/2021=  1; 09/15/2021= 9; 12/13/2021= Will issue next visit 12/6: 4; 06/06/2022= Will assess next visit; Goal not appropriate as patient is not ambulatory.     Time 12     Period Weeks     Status WITHDRAWN    Target date 11/30/2022         PT LONG TERM GOAL #5    Title Patient will perform sit to stand transfer with appropriate AD and max assist of 2 people with 75%  consistency to prepare for pregait activities.     Baseline 09/15/2021= Patient unable to stand well- unable to clear his bottom off chair with Max assist of 2 persons. 12/13/2021- Goal not appropriate to try yet but will keep and roll over to next cert as shift continues to focus on transfers/standing; 4/24= Patient able to perform active ankle DF/PF with right LE and able to raise his knee into seated march and clear floor without physical assist today - as previously unable as well as lift right knee ext to near full ROM to improve strength for eventual standing. 09/07/2022- Goal continues to not be appropriate but patient is now standing some and will keep goal active; 11/23/2022= patient is now performing sit to stand with max A +2 and now able to clear his bottom off mat. 11/30/2022- Patient continues to perform sit to stand transfers with Max A+2 and able to clear bottom well off mat but not erect yet. 05/31/2023- Patient is able to stand consistently with max A +2 - unable to achieve full knee or trunk ext yet is able to clear bottom off mat for improved LE weightbearing.     Time 12     Period Weeks     Status PROGRESSING    Target date  08/23/2023       PT LONG TERM GOAL #6  Title Patient will tolerate sitting unsupported demonstrating erect sitting posture for 30 minutes with CGA to demonstrate improved back extensor strength and improved sitting tolerance.   Baseline 12/13/2021= Patient demonstrated unsupported sitting at edge of mat for approx 20 min;  12/29/2021- Patient performed approx 30 min of dynamic sitting activities today. 06/06/2022= Patient demonstrated ability to sit and perform static and dynamic UE/LE movement with only Supervision.   Time 12   Period Weeks   Status GOAL MET           7.  Patient will tolerate 2 minutes or more of standing in sit to stand lift or with max assist +2 in order to indicate improved lower extremity weightbearing tolerance for progression to standing in  parallel bars. Baseline: 1 minute on most recent stand 02/21/22; 06/06/2022- Patient did attempt today after complaining of right LE pain last week. He attempted 3 stands using sit to stand lift. - all over 1 min- last one approx 48 sec- stopped due to fatigue. More erect standing in lift today - still poor gluteal strength but able to activate glutes and extend hips upon command but unable to hold > 5 sec. 07/28/2022- Will attempt standing next visit but patient has been able to stand since last progress note for up to 3 min at a time at his best. 09/07/2022-  attempted standing with max +2 and patient able to stand 15-20 sec so will now  revise goal; 11/23/2022- Patient at his best between last 2 visits was able to stand approx 50 sec with max A+2.  11/30/2022- Patient performed 4 trials of static standing - max A +2- clearing  bottom and using forearms to bear weight  03/15/2023: Patient performed 2 trials of static standing - max A +2- clearing edge of mat and using forearms to bear weight for approximately 45 seconds, is able to come into approximately 45 degrees of knee flexion from sit to stand; 05/31/2023- Patient has not attempted standing in 1 month but able to perform x 4 trials with max A +2 (PT and his mom) and able to clear his bottom off mat and attempted to activate his glutes and apply some forearm pressure - able to stand between 25-40 sec today.  Goal status: PROGRESSING Target date: 08/23/2023   8. Patient will tolerate sitting unsupported demonstrating ability to perform dynamic UE/LE activities for 30 minutes  independently to demonstrate ability to sit at edge of bed to eat or perform some ADL's/exercise for optimal quality of life.             Baseline: 06/06/2022- Patient able to sit and perform some UE/LE exercises but requires CGA at times for safety- he is able to static sit for 30 min with supervision. 07/27/2022= Patient able to now sit > 30 min with Supervision only - performing static  and dynamic activities. Will keep goal active to ensure patient is consistent. 09/07/2022=  Patient able to demonstrate consistent ability to sit at edge of mat during visits             Goal Status: MET             Target date: 08/29/2022        Plan     Clinical Impression Statement Treatment was limited today as patient was unable to progress with sitting activities- stating he just did not feel comfortable when sitting despite multiple efforts to assist in positioning today. He was unable to verbalize exactly what he was feeling and despite encouragement from his mother and Thereasa Parkin he ultimately requested to go back to w/c. He was visibly upset about his performance and was able to end with some LE strengthening. Discussed that everyone has days that are not their best and encouraged him to continue to work hard at home and be ready to try again next visit.  Pt will continue to benefit from skilled physical therapy intervention to address impairments, improve QOL, and attain therapy goals.    Personal Factors and Comorbidities Comorbidity 3+;Time since onset of injury/illness/exacerbation    Comorbidities CVA, diabetes, Seizures    Examination-Activity Limitations Bathing;Bed Mobility;Bend;Caring for Others;Carry;Dressing;Hygiene/Grooming;Lift;Locomotion Level;Reach Overhead;Self Feeding;Sit;Squat;Stairs;Stand;Transfers;Toileting    Examination-Participation Restrictions Cleaning;Community Activity;Driving;Laundry;Medication Management;Meal Prep;Occupation;Personal Finances;Shop;Yard Work;Volunteer    Stability/Clinical Decision Making Evolving/Moderate complexity    Rehab Potential Fair    PT Frequency 2x / week    PT Duration 12 weeks    PT Treatment/Interventions ADLs/Self Care Home Management;Cryotherapy;Electrical Stimulation;Moist Heat;Ultrasound;DME Instruction;Gait training;Stair training;Functional mobility training;Therapeutic exercise;Balance training;Patient/family education;Orthotic  Fit/Training;Neuromuscular re-education;Wheelchair mobility training;Manual techniques;Passive range of motion;Dry needling;Energy conservation;Taping;Visual/perceptual remediation/compensation;Joint Manipulations    PT Next Visit Plan core strength/motor control in short-sitting, sit to stand if able; Continue with progressive LE Strengthening. Stand activities as appropriate. Attempt Stedy (sit to stand device) in future visits.   PT Home Exercise Plan No changes to HEP today    Consulted and Agree with Plan of Care Patient;Family member/caregiver    Family Member Consulted             03/09/2023, 8:04 AM      1:44 PM, 06/15/23 Lenda Kelp PT Physical Therapist - [facility]  1:44 PM 06/15/23

## 2023-06-15 NOTE — Therapy (Signed)
 Occupational Therapy Neuro Treatment Note     Patient Name: Paul Hall MRN: [id number] DOB:[birth year], 28 y.o., male Today's Date: 06/15/2023  PCP: Dr. Sherwood Gambler REFERRING PROVIDER: Dr. Sherwood Gambler    OT End of Session - 06/15/23 1048     Visit Number [id number]    Number of Visits 132    Date for OT Re-Evaluation 08/02/23    OT Start Time 1540    OT Stop Time 1615    OT Time Calculation (min) 35 min    Equipment Utilized During Treatment tilt in space power wc    Activity Tolerance Patient tolerated treatment well    Behavior During Therapy WFL for tasks assessed/performed                    Past Medical History:  Diagnosis Date   Diabetes mellitus (HCC)    Hypertension    Stroke [facility])    Past Surgical History:  Procedure Laterality Date   IVC FILTER PLACEMENT ([facility] HX)     LEG SURGERY     PEG TUBE PLACEMENT     TRACHEOSTOMY     Patient Active Problem List   Diagnosis Date Noted   Sepsis due to vancomycin resistant Enterococcus species (HCC) 06/06/2019   SIRS (systemic inflammatory response syndrome) (HCC) 06/05/2019   Acute lower UTI 06/05/2019   VRE (vancomycin-resistant Enterococci) infection 06/05/2019   Anemia 06/05/2019   Skin ulcer of sacrum with necrosis of muscle (HCC)    Urinary retention    Type 2 diabetes mellitus without complication, with long-term current use of insulin (HCC)    Tachycardia    Lower extremity edema    Acute metabolic encephalopathy    Obstructive sleep apnea    Morbid obesity with BMI of 60.0-69.9, adult (HCC)    Goals of care, counseling/discussion    Palliative care encounter    Sepsis (HCC) 04/27/2019   H/O insulin dependent diabetes mellitus 04/27/2019   History of CVA with residual deficit 04/27/2019   Seizure disorder (HCC) 04/27/2019   Decubitus ulcer of sacral region, stage 4 (HCC) 04/27/2019   ONSET DATE: 01/2019  REFERRING DIAG: CVA/COVID-19  THERAPY DIAG:  Muscle weakness (generalized)  Rationale for  Evaluation and Treatment Rehabilitation  SUBJECTIVE:   SUBJECTIVE STATEMENT:  Pt. was late arriving for the session this afternoon  Pt accompanied by: self and family member  PERTINENT HISTORY:  Pt. is a 28 y.o. male who was diagnosed with COVID-19, and CVA with resultant quadriplegia. Pt. Was then hospitalized with VRE UTI. PMHx includes: urinary retention, seizure disorder, obstructive sleep disorder, DM Type II, Morbid obesity.   PRECAUTIONS: None  WEIGHT BEARING RESTRICTIONS No  PAIN:  No reports of pain Are you having pain? 0/10  FALLS: Has patient fallen in last 6 months? No  LIVING ENVIRONMENT: Lives with: lives with their family Lives in: House/apartment Stairs: No Level Entry Has following equipment at home: Wheelchair (power) and hoyer lift, sit to stand lift  PLOF: Independent  PATIENT GOALS: To be able to engage in more daily care tasks.  OBJECTIVE:   HAND DOMINANCE: Right  ADLs:  Transfers/ambulation related to ADLs: Eating: Pt. reports being able to hold standard utensils, and is starting to engage more in self-feeding tasks, hand to mouth patterns. Pt. Reports that he does as much as he can with the task, and family assists  with the remainder of the task. Grooming: Pt. Is able to initiate holding an electric toothbrush, and brush his teeth. Family assists  LB Dressing: Total Assist UE dressing: Pt. is now able to reach up to actively assist with grasping , and pulling his gown down. Toileting:  Total Assist Bathing: MaxA UB, Total assist LB Tub Shower transfers: N/A Equipment: See above    IADLs: Shopping: Relies on family to assist Light housekeeping: Total Assist Meal Prep: Total Assist Community mobility:   Medication management:  Total Assist  Financial management: N/A Handwriting: Not legible: Pt. Is able to hold a pen with the left hand, and initiate marking the page. Pt.'s eye glasses were not available  MOBILITY STATUS:  Power  w/c  POSTURE COMMENTS:  Pt. Requires position changes in his power w/c  ACTIVITY TOLERANCE: Activity tolerance:  Fair  FUNCTIONAL OUTCOME MEASURES: FOTO: 40  TR score: 45  05/25/2022:   FOTO: 50 TR score: 45  07/20/22: FOTO 55  03/08/23: 51  UPPER EXTREMITY ROM     Active ROM Left eval Left  03/21/2022 Left 05/25/2022 L  07/20/22 Left 07/25/2022 Left  08/15/2022 Left  09/26/2022 Left  11/16/2022 Left 12/28/2022 Left 03/13/23 Left 05/10/23  Shoulder flexion 119 123 125 130  136 138 130 142 144 144  Shoulder abduction 110 115 115 115  118 121 125 142 140 140  Shoulder adduction             Shoulder extension             Shoulder internal rotation             Shoulder external rotation             Elbow flexion 135(135) 145 145 145  145    145 145  Elbow extension -27(-18) -21(-20) -20(-16)  -25(-10) -23(-10) -21(-10) -20(-18) -20(-18) -20(-17) -17(-16)  Wrist flexion             Wrist extension 10(50) 19(50) 28(50)  30(50) 35(55) 36(55) 36(60) 40(60) 45(60) 45(60)  Wrist ulnar deviation             Wrist radial deviation             Wrist pronation             Wrist supination   Limited by flexor tone       10(15)   (Blank rows = not tested)    Active ROM Right eval Right 03/21/2022 Right 05/25/2022 R  07/20/22 Right  07/25/22 Right 08/15/2022 Right 09/26/2022 Right 11/16/2022 Right 12/28/2022 Right 03/13/23 Right 05/10/23  Shoulder flexion 106 scaption 105  Scaption 62 flexion 110 scaption 100  105 105 105 112 Scaption 123 Scaption 122  Scaption  Shoulder abduction 114 90 97 100  105 117 120 124 124 122  Shoulder adduction             Shoulder extension             Shoulder internal rotation             Shoulder external rotation             Elbow flexion 120(130) 145 145 140  144 140 142 148 148 145  Elbow extension -45(-35) -22(-35) -28(-22)  -32(-21) -32(-28) -28(-26) -28(-28) -27(-26) -27(-40) -38(-35)  Wrist flexion             Wrist extension -30(10)  -25(10) -10(30)  -10(20) -10(25) -20(40) -30(10) -22(34) -22(35) -32(22)  Wrist ulnar deviation             Wrist radial deviation  Wrist pronation             Wrist supination   Limited by flexor tone            12/28/2022: Thumb radial abduction: Right: 12(46), Left: 40(54)  Digits: Bilateral 2nd through 5th digit: MP Hyperextension with PIP, an DIP flexion     UPPER EXTREMITY MMT:     Left eval Left 03/21/2022 Left  05/25/2022 L 07/20/22 Left  08/15/2022 Left 09/26/2022 Left 11/16/2022 Left 12/28/2022 Left  03/13/2023 Left  05/10/23  Shoulder flexion 3-/5 3/5 3+/5 4+/5 4+/5 4+/5 4+/5 4+/5 4+/5 4+/5  Shoulder abduction 3-/5 3-/5 4-/5 4+/5 4+/5 4+5 4+/5 4+/5 4+/5 4+/5  Shoulder adduction            Shoulder extension            Shoulder internal rotation            Shoulder external rotation            Elbow flexion 3/5 3+/5 4-/5 4+/5 5/5 5/5 5/5 5/5 5/5 5/5  Elbow extension 2-/5 2/5  4+/5 4+/5 4+/5  4/5 4/5 4/5 through available range  Wrist flexion            Wrist extension 2/5 2+/5 3-/5  3-/5 3-/5 3-/5 3/5 3/5 3/5  Wrist ulnar deviation            Wrist radial deviation            Wrist pronation            Wrist supination            (Blank rows = not tested)  Right eval Right 03/21/2022 Right  05/25/2022 R 07/20/22 Right 08/15/2022 Right 09/26/2022 Right 11/16/2022 Right 12/28/2022 Right 03/13/2023 Right 05/10/23  Shoulder flexion 3-/5 scaption 3-/5 3-/5 3+/5 3+/5 3+/5 3/5 3/5 3/5 3+/5  Shoulder abduction 3-/5 3-/5 3-/5 4/5 4/5 4/5 4/5 4/5 4/5 4/-5  Shoulder adduction            Shoulder extension            Shoulder internal rotation            Shoulder external rotation            Elbow flexion 3/5 3/5 3+/5 4/5 4+/5 4+/5 4+/5 4+/5 4+/5 4+/5  Elbow extension 2-/5 2/5 3-/5 4+/5 4+/5 4+/5 4+/5 4-/5 4-/5 4-/5  Wrist flexion            Wrist extension 2-/5 2-/5 2/5 2/5 2/5 2/5 2/5 2/5 2/5 2/5  Wrist ulnar deviation            Wrist radial deviation             Wrist pronation            Wrist supination              HAND FUNCTION:  Grip strength: Right: 0 lbs; Left: 0 lbs and Lateral pinch: Right: 5 lbs, Left: 2 lbs  03/21/2022: Lateral pinch: Right: 3.5 lbs, Left: 2 lbs  07/20/22: Grip strength: Right: 3 lbs; Left: 4 lbs and Lateral pinch: Right: 5 lbs, Left: 3 lbs  08/15/2022:  Grip strength: Right: 0 lbs; Left: 5 lbs and Lateral pinch: Right: 2 lbs, Left: 4 lbs  09/26/2022  Grip strength: Right: 0 lbs; Left: 8 lbs and Lateral pinch: Right: 2 lbs, Left: 5 lbs  11/16/2022  Grip strength: Right: 0 lbs; Left: 8 lbs  9/25/20204  Grip strength: Right: 0 lbs; Left: 8 lbs  Grip strength: Right: 0 lbs; Left: 8 lbs   03/08/23:  Grip strength: Right: 0 lbs without PROM, 5 lbs after PROM, Left 8 lbs  05/10/23:  Grip strength: TBD  Bilateral digit PIP/DIP flexion contractures with MP hyperextension with attempts for AROM. Pt. is able to tolerate AROM to the bilateral digits at the initial evaluation however, has a history of pain in the digits.  COORDINATION: Eval: Pt. is unable to grasp 9-hole test pegs. Pt. is able to initiate grasping larger pegs, and is able to hold a pen in the left hand.  07/20/22: 2 min 36 seconds to remove 9 pegs from 9 hole peg test - cues to locate pegs 2/2 low vision. Pt. is able to initiate grasping larger pegs on R hand and is able to hold a pen in the left hand.  08/15/2022:    Vision, and sensation limiting accuracy of 9 hole peg test results. Pt. Was able to grasp and remove vertical pegs intermittently with cues.    05/10/23: TBD  SENSATION: Light touch: Impaired   EDEMA:  N/A  MUSCLE TONE: BUE flexor Spasticity  COGNITION Overall cognitive status: Continue to assess in functional context  VISION:   Subjective report: Pt. was not wearing glasses at the time of the initial eval.  Baseline vision: Vision is very limited. Wears glasses all the time Visual history: History of impaired vision  following CVA. Pt. Has received treatment through the [facility] low vision rehabilitation program.   VISION ASSESSMENT: Impaired To be further assessed in functional context  PERCEPTION: Impaired   PRAXIS: Impaired: motor planning  OBSERVATIONS:  Pt reports being on Tramadol   TODAY'S TREATMENT    Neuromuscular re-education:   -Facilitated [facility] skills grasping 1" blocks with each the right, and left hand. -Emphasized elbow extension reaching out to place the over a target wall -Facilitated [facility] skills grasping 1.5" flat discs, and sliding them off the edge of an elevated surface raking them with his fingers to the thumb.   -Pt. worked on sustaining grasp on the flat discs and reached out at multiple angles to place them into a jar.      PATIENT EDUCATION: Education details: stretching, PROM, massage to the digits on the bilateral hands. Person educated: Patient and Parent Education method: Explanation Education comprehension: verbalized understanding  HOME EXERCISE PROGRAM:  Continue ongoing assessment, and continue to provide as needed.   GOALS: Goals reviewed with patient? Yes  SHORT TERM GOALS: Target date: 11/07/2022   To assess splint fit, and make appropriate adjustments to promote good skin integrity through the palmar surface of the bilateral hands.  Baseline: 05/25/22: Goal currently met, however ongoing as needs to assess splint fit arise. 03/23/2022: Pt. is wearing splints a couple of hours at night bilateral resting hand splints. 03/21/2022: Pt. is wearing splints a couple of hours at night bilateral resting hand splints. Goal status: Deferred   LONG TERM GOALS: Target date: 08/02/2023    FOTO score will Improve by 2 points for Pt. perceived improvement with the assessment specific ADL/IADL tasks.  Baseline:  03/08/23: 51; 12/28/2022: FOTO score: 56; 11/16/2022: 52 09/27/2022: 51 07/20/2022: FOTO 55 05/25/2022: FOTO score: 50,  TR score: 45 Eval: FOTO score: 40,  TR score:  45 Goal status: Achieved   2.   Pt. will independently perform oral care for 100% of the task after complete set-up. Baseline: 05/10/23: Pt. Performs 90% of the task. Pt.'s mother assists  with 10% of the task for thorough cleaning the hard to reach places way in the back of the oral cavity.03/08/23: not as much assist required proximally, but to adjust positioning of toothbrush; 02/13/23: Pt. Continues to require assist proximally 01/09/2023: Continue 12/28/2022:  Pt. Continues to complete 90% of the task with complete set-up, with visual cues, and occassional assist of the right elbow. 11/16/2022: Pt. Completes 90% of the task with complete set-up. 09/26/2022: Completes 90% of the task, proximal assist at times required at the elbow.  08/15/2022: Completes 90% of the task, proximal assist at times required at the elbow. 07/20/22: completes 90% of task, limited by shoulder flexion. 05/25/2022:  Pt. Is able to initiate and perform oral care for approximately 90% of the task. Complete set-up required. Assi needed only for the very back teeth. 03/23/2022: Pt. Is able to initiate and perform oral care for approximately 75% of the task. Pt. Requires assist at proximally at the elbow for through oral care. 03/21/2022: Pt. Is able to initiate and perform oral care for approximately 75% of the task. Pt. Requires assist at proximally at the elbow for through oral care. Eval: Pt. is able to initiate using an electric toothbrush. Pt. requires assist for set-up, and assist for thoroughness, and as he Pt. fatigues. Goal status:  Partially met, Goal discontinued  3.  Pt. Will be modified independence with self-feeding for 100% using a swivel spoon, and standard fork after complete set-up Baseline: 05/10/2023: Pt. feeds self 95-100% of the meal. with occasional assist to reposition the utensil. 03/08/23: indep with cup with a handle and lid, assist to reposition eating utensils in hand;  02/13/23:  Pt. Is now able to consistently,  and efficiently use a swivel spoon for eating cereal with the left hand.01/09/2023: Continue 12/28/2022: Pt. Is now feeding himself 90% of the time using a swivel spoon with the left hand, and 95% of the time with the fork. Pt. Continues to have difficulty with cutting food. 11/16/2022: 100% with finger foods, Pt. Is initiating with a swivel spoon, and universal cuff for fork use, however requires assist for consistency, and accuracy.  09/26/2022: 90% using the spoon, assist at time with the forearm motion, and grip on the utensils. 100% for finger foods.  08/15/2022:  90% using the spoon, assit at time with the forearm motion, and grip on the utensils. 100% for finger foods-independent with set-up- unsupervised.  07/20/22: self-feeds cereal using spoon 90% of task. 05/25/2022: Pt. Is able to use a spoon to scoop cereal when feeding himself cereal 85% of the time. Pt. Is able to feed himself snack/finger foods 100% of the time. Pt. Continues to work on consistency of  stabilizing a cup/mug when drinking. Pt. Is able to grasp a water bottle with assist initially, with assist tapering off as he drinks.03/23/2022: Pt. Is able to perform scooping cereal for 75% of the time. Pt. required assist, and support at the left elbow, and Pt. Presents with limited forearm supination when using the spoon, and bringing it towards his mouth. Pt. Is able to use a fork to spear items, and perform the hand to mouth pattern.  03/21/2022: Pt. Is able to perform scooping cereal for 75% of the time. Pt. required assist, and support at the left elbow, and Pt. presents with limited forearm supination when using the spoon, and bringing it towards his mouth. Pt. Is able to use a fork to spear items, and perform the hand to mouth pattern.  Eval: Pt. is  able to hold standard standard utensils. Pt. Performs as much of the task as he, can and has assistance for the remainder. Goal status:  Improved, Achieved  4.  Pt. will improve grasp patterns and  consistently grasp 1/4" objects for ADL, and IADL tasks.  Baseline: 05/31/2023: Continue 05/10/23: Pt. is consistently able to grasp 1/2" objects with visual cues. Pt. Requires assist, and increased visual cues to grasp 1/4" objects on an elevated plane.  Pt. Is unable to grasp the flat at the tabletop surface. Pt. Is grasping grasping small puppy vitamins.03/08/23: 1/2" objects efficiently; 02/13/2023: 1/2" objects efficiently 01/09/2023: Continue 12/28/2022: Continues to grasp 1/4" pegs with min a + visual cues, consistently grasping 1/2" objects with visual cues 11/16/2022: Continues to grasp 1/4" pegs with min a + visual cues, consistently grasping 1/2" objects with visual cues 09/26/2022: Continues to grasp 1/4" pegs with min a + visual cues, consistently grasping 1/2" objects with visual cues. 08/15/2022: grasps 1/4" pegs with min a + visual cues, consistently grasping 1/2" objects with visual cues. 07/20/22: grasps 1/4" pegs with min a + visual cues, consistently grasping 1/2" objects with visual cues. 05/25/2022: Pt. Is working on improving consistency of grasping 1/2" objects with visual cues.  03/23/2022: Pt. Is able to grasp 1" objects consistently,and continues to work on the hand patterns needed to grasp 1/2" objects.03/21/2022: Pt. Is able to grasp 1" objects consistently,and continues to work on the hand patterns needed to grasp 1/2" objects. Eval: Pt. is able to grasp 1" objects intermittently using a lateral grasp pattern. Goal status: Ongoing  5.  Pt. will independently write his name legibly with letter sizes under 1". Baseline: 05/10/23: Deferred 03/08/23: Not addressed this period; 02/13/2023: Continue 01/09/2023: Continue. 12/28/2022: Pt. is now using an IPad Pen. 11/16/2022: Pt. Continues to be able to write name with smaller letter size. Pt. Is signing name with a computer styus. Pt. Requires visual cues, and assist 09/26/2022: Pt. Continues to be able to write name with smaller letter size. Pt. Is  signing name with a computer styus. Pt. Requires visual cues, and assist. 08/15/2022: Pt. Is able to write name with smaller letter size. Pt. Is signing name with a computer styus. Pt. Requires visual cues, and assist. 07/20/22: stabilizing assist to write name with 75% legibility with 2" letters. 05/25/2022: Pt. to continue to work towards formulating grasp patterns in preparation for grasping a large width pen. Pt. Requires visual cues. 03/23/2022: Pt. Is able to write his name with modA, however has difficulty with formulating letter sizes less than 2" in size with 50% legibility for the 3 letters of his name.03/21/2022: Pt. Is able to write his name with modA, however has difficulty with formulating letter sizes less than 2" in size with 50% legibility for the 3 letters of his name. Eval: Pt is able to hold a thin marker with his left hand, and formulate a line, and initiate a circular pattern (Pt. without glasses today) Goal status: Deferred  6. Pt. Will reach up to comb/brush his hair with minA.  Baseline: 05/10/23: Pt. is able to use a pick independently with the left hand with occasional assist to the far side of the left his head. 03/08/23: Pt uses pick in L hand to reach 75% of his head; unable to reach R side of head; 02/13/2023:Pt. is able to use a hair pick with the left hand on the right side of his head, top, and back of the head. Pt. Continues to have difficulty reaching  further to the right side of his head with the left. 01/09/2023: Continue 12/28/2022: Pt. Is progressing, and is now able to use a hair pick with the left hand on the right side of his head, top, and back of the head. Pt. Continues to have difficulty reaching further to the right side of his head with the left. 11/16/2022: Pt. Is able to use a hair pick for the left side of his head using his left hand. Pt. Presents with difficulty reaching to the right side of his head.09/26/2022: Pt. Is able to use a hair pick for the left side of his  head using his left hand. Pt. Presents with difficulty reaching to the right side of his head. 5/13/202: 75% using a hair pick, assist to the right side of the head. 07/20/22: reaches 75% of head, assist for far R side of head, fatigues quickly. 05/25/2022: Pt. Is able to reach up with the left hand to the left side, top, and back of his head. 03/23/2022: Pt. is now able to more consistently initiate reaching up to his head with his left hand in preparation for haircare12/18/2023: Pt. is now able to more consistently initiate reaching up to his head with his left hand in preparation for haircare. Eval: Pt. is able to initiate reaching up for hair care with a long handled brush, however is unable to sustain UE's in elevation to perform the task.     Goal status: Achieved  7. Pt. Will independently navigate the w/c through his environment with minA with visual scanning, and hand placement on the controls.  Baseline: 11/16/2022: Deferred 2/2 vision 09/26/2022: Pt. Is now navigating his w/c ouside the home, and around his block. 08/15/2022: Pt. Requires minA to setup hand on controls and MIN cues to navigate the w/c in wide spaces, requires MIN - MOD cues to navigate the w/c through more narrow doorways, and tighter turns - varies based on good vs bad vision days.07/20/22: Pt. Requires minA to setup hand on controls and MIN cues to navigate the w/c in wide spaces, requires MIN - MOD cues to navigate the w/c through more narrow doorways, and tighter turns - varies based on good vs bad vision days. 05/25/2022: Pt. Requires minA  and  cues to navigate the w/c in wide spaces, and requires Mod cues to navigate the w/c through more narrow doorways, and tighter turns. Pt. Requires max cues for scanning through the environment, and moderate cues for hand placement on the controls.     Goal status: Deferred 2/2 vision    8. Pt. Will improve bilateral grip strength to be able to independently grasp, and pull up blankets, and  linens.   Baseline: 05/10/23: Deferred 03/08/23: R grip 0-5 lbs, L grip 8 lbs; pt reports he can consistently pull up blankets and linens independently.  02/13/2023: Continue. Pt. presents with digit PIP, and DIP flexor tightness. 01/09/2023: Continue 12/28/2023: R: 0#, L: 5# Pt. Is now able to more consistently grasp and pull blankets up over him. 11/16/2022: R: 0 L: 8# Pt. Is more consistently grasping his blankets and attempting to pull them up. 09/26/2022: R: 0  L: 8# Pt. is attempting to grasp, and pull blankets up more, using mostly the left hand. 08/15/2022: Pt. has difficulty securely holding, and pulling up blankets, and linens.    Goal status:Deferred  9. Pt. will consistently actively control the releasing of blankets, covers, and linens from his hands once they are in the desired position over him.  Baseline:12/28/2022: Pt. Is consistently actively able to release linens once they are in the desired position over him. 11/16/2022: Pt. continues to improve with actively releasing blankets form his hands. 09/26/2022: Pt. is improving with actively releasing blankets form his hands. 08/15/2022: Pt. has difficulty with controlled releasing of blankets/linens from his grasp.     Goal status: Achieved    10. Pt. Will improve bilateral UE strength by 2 MM grades to assist with ADLs, and IADLs  Baseline: 05/31/2023: Continue 05/10/23: Right: shoulder flexion: 3+/5, abduction: 44-/5, elbow flexion: 4+/5 , extension: 4-/5 wrist extension: 2/5; Left: shoulder flexion: 4+/5, abduction: 4+/5, elbow flexion: 5/5 , extension: 4/5  wrist extension: 3/5 03/13/2023: Right: shoulder flexion: 3/5, abduction: 4/5, elbow flexion: 4+/5 , extension: 4-/5 wrist extension: 2/5; Left: shoulder flexion: 4+/5, abduction: 4+/5, elbow flexion: 5/5 , extension: 4/5  wrist extension: 3/5 03/08/23: NT this date d/t as pt arrived late; 02/13/2023: Continue 01/09/2023: Continue 12/28/2022: Right: shoulder flexion: 3/5, abduction: 4/5, elbow  flexion: 4+/5 , extension: 4-/5 wrist extension: 2/5; Left: shoulder flexion: 4+/5, abduction: 4+/5, elbow flexion: 5/5 , extension: 4/5  wrist extension: 3/5 Right: shoulder flexion: 3+/5, abduction: 4/5, elbow flexion: 4+/5 , extension: 4+/5 wrist extension: 2/5; Left: shoulder flexion: 4+/5, abduction: 4+/5, elbow flexion: 5/5 , extension: 4+/5 wrist extension: 3-/5 11/16/2022: Right: shoulder flexion: 3/5, abduction: 4/5, elbow flexion: 4+/5 , extension: 4+/5 wrist extension: 2/5; Left: shoulder flexion: 4+/5, abduction: 4+/5, elbow flexion: 5/5 , extension:  wrist extension: 3-/5 Right: shoulder flexion: 3+/5, abduction: 4/5, elbow flexion: 4+/5 , extension: 4+/5 wrist extension: 2/5; Left: shoulder flexion: 4+/5, abduction: 4+/5, elbow flexion: 5/5 , extension: 4+/5 wrist extension: 3-/5 Goal status: Ongoing  11. Pt. will improve right wrist extension by 10 degrees to initiate reaching for items in preparation for ADLs. Baseline: 05/31/23: Continue 05/10/23: Right wrist extension: -32(22) 03/13/23: -22(35) 03/08/23: NT this date as pt arrived late; 02/13/2023: Continue 01/09/2023: Continue 12/28/2022: -22(34) 11/16/2022: -30(10)    Goal status: Ongoing      12. Pt. Will require donn/doff a Tank top T-shirt efficiently with supervision and full set-up.    Baseline: 05/31/2023: Pt. Requires maxA donning a pullover sweatshirt, Mod-maxA doffing it. T-shirt tank TBD 05/10/23: Pt. Presents with difficulty completing, and requires increased time to complete    Goal status: New    13. Pt. Will will independently perform bilateral/bimanual hand tasks needed to fold towels/linens/laundry Baseline:05/31/23: Continue 05/10/23:  Pt. Has difficulty folding towels/lines/laundry using bilateral hands Goal status: New  14. Pt. Will independently, and efficiently reach his right arm over the side of the chair to pet his dog.    Baseline: 05/31/2023: Continue 05/10/23: Pt. Is unable to efficiently reach his right UE over  the side of his recliner chair to pet his dog.    Goal status: New    15. Pt. Will independently, and efficiently reach out in front of him to efficiently place items onto his table while sitting in his recliner.     Baseline: 2/26/225: Continue 05/10/23: Pt. has difficulty reaching out in front of him far enough to efficiently place items on the table while sitting in a recliner chair.    Goal status: New       ASSESSMENT:  CLINICAL IMPRESSION:  Pt. reports having an appointment scheduled for Botox Injections later this month. Pt. 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 Pt. education was provided about tactile compensation for visual  limitations using his thumb to locate the edge of the board to meet the digits in order to secure the flat discs. Pt. continues to be highly motivated to continue working towards improving BUE functioning, ROM, strength, motor control, [facility] skills, as well as visual compensatory strategies in order to maximize independence with daily ADLs, and IADL functioning.      PERFORMANCE DEFICITS in functional skills including ADLs, IADLs, coordination, dexterity, proprioception, ROM, strength, pain, FMC, GMC, decreased knowledge of use of DME, and UE functional use, cognitive skills including safety awareness, and psychosocial skills including coping strategies, environmental adaptation, habits, and routines and behaviors.   IMPAIRMENTS are limiting patient from ADLs, IADLs, leisure, and social participation.   COMORBIDITIES may have co-morbidities  that affects occupational performance. Patient will benefit from skilled OT to address above impairments and improve overall function.  MODIFICATION OR ASSISTANCE TO COMPLETE EVALUATION: Maximum or significant modification of tasks or assist is necessary to complete an evaluation.  OT OCCUPATIONAL PROFILE AND  HISTORY: Comprehensive assessment: Review of records and extensive additional review of physical, cognitive, psychosocial history related to current functional performance.  CLINICAL DECISION MAKING: High - multiple treatment options, significant modification of task necessary  REHAB POTENTIAL: Fair    EVALUATION COMPLEXITY: High    PLAN: OT FREQUENCY: 2 x's a week  OT DURATION:12 weeks  PLANNED INTERVENTIONS: self care/ADL training, therapeutic exercise, therapeutic activity, neuromuscular re-education, manual therapy, passive range of motion, patient/family education, and cognitive remediation/compensation RECOMMENDED OTHER SERVICES: PT  CONSULTED AND AGREED WITH PLAN OF CARE: Patient and family member/caregiver  PLAN FOR NEXT SESSION: Review HEP  Olegario Messier, MS, OTR/L  06/15/2023

## 2023-06-19 ENCOUNTER — Ambulatory Visit: Payer: Medicare Other

## 2023-06-19 ENCOUNTER — Ambulatory Visit: Payer: Medicare Other | Admitting: Occupational Therapy

## 2023-06-20 DIAGNOSIS — R32 Unspecified urinary incontinence: Secondary | ICD-10-CM | POA: Diagnosis not present

## 2023-06-20 DIAGNOSIS — I6389 Other cerebral infarction: Secondary | ICD-10-CM | POA: Diagnosis not present

## 2023-06-20 DIAGNOSIS — L8994 Pressure ulcer of unspecified site, stage 4: Secondary | ICD-10-CM | POA: Diagnosis not present

## 2023-06-21 ENCOUNTER — Ambulatory Visit: Payer: Medicare Other

## 2023-06-21 ENCOUNTER — Ambulatory Visit: Payer: Medicare Other | Admitting: Occupational Therapy

## 2023-06-21 DIAGNOSIS — R269 Unspecified abnormalities of gait and mobility: Secondary | ICD-10-CM | POA: Diagnosis not present

## 2023-06-21 DIAGNOSIS — R2689 Other abnormalities of gait and mobility: Secondary | ICD-10-CM | POA: Diagnosis not present

## 2023-06-21 DIAGNOSIS — R262 Difficulty in walking, not elsewhere classified: Secondary | ICD-10-CM | POA: Diagnosis not present

## 2023-06-21 DIAGNOSIS — R2681 Unsteadiness on feet: Secondary | ICD-10-CM

## 2023-06-21 DIAGNOSIS — I6930 Unspecified sequelae of cerebral infarction: Secondary | ICD-10-CM | POA: Diagnosis not present

## 2023-06-21 DIAGNOSIS — R278 Other lack of coordination: Secondary | ICD-10-CM

## 2023-06-21 DIAGNOSIS — M6281 Muscle weakness (generalized): Secondary | ICD-10-CM | POA: Diagnosis not present

## 2023-06-21 NOTE — Therapy (Addendum)
 Occupational Therapy Neuro Treatment Note     Patient Name: Paul Hall MRN: [id number] DOB:June 13, 1995, 28 y.o., male Today's Date: 06/21/2023  PCP: Dr. Sherwood Gambler REFERRING PROVIDER: Dr. Sherwood Gambler    OT End of Session - 06/21/23 1723     Visit Number [id number]    Date for OT Re-Evaluation 08/02/23    Authorization Type 09/26/2022    OT Start Time 1558    OT Stop Time 1615    OT Time Calculation (min) 17 min    Equipment Utilized During Treatment tilt in space power wc    Activity Tolerance Patient tolerated treatment well    Behavior During Therapy WFL for tasks assessed/performed                    Past Medical History:  Diagnosis Date   Diabetes mellitus (HCC)    Hypertension    Stroke [facility])    Past Surgical History:  Procedure Laterality Date   IVC FILTER PLACEMENT ([facility] HX)     LEG SURGERY     PEG TUBE PLACEMENT     TRACHEOSTOMY     Patient Active Problem List   Diagnosis Date Noted   Sepsis due to vancomycin resistant Enterococcus species (HCC) 06/06/2019   SIRS (systemic inflammatory response syndrome) (HCC) 06/05/2019   Acute lower UTI 06/05/2019   VRE (vancomycin-resistant Enterococci) infection 06/05/2019   Anemia 06/05/2019   Skin ulcer of sacrum with necrosis of muscle (HCC)    Urinary retention    Type 2 diabetes mellitus without complication, with long-term current use of insulin (HCC)    Tachycardia    Lower extremity edema    Acute metabolic encephalopathy    Obstructive sleep apnea    Morbid obesity with BMI of 60.0-69.9, adult (HCC)    Goals of care, counseling/discussion    Palliative care encounter    Sepsis (HCC) 04/27/2019   H/O insulin dependent diabetes mellitus 04/27/2019   History of CVA with residual deficit 04/27/2019   Seizure disorder (HCC) 04/27/2019   Decubitus ulcer of sacral region, stage 4 (HCC) 04/27/2019   ONSET DATE: 01/2019  REFERRING DIAG: CVA/COVID-19  THERAPY DIAG:  Muscle weakness  (generalized)  Rationale for Evaluation and Treatment Rehabilitation  SUBJECTIVE:   SUBJECTIVE STATEMENT:  Pt. was late arriving for the session this afternoon  Pt accompanied by: self and family member  PERTINENT HISTORY:  Pt. is a 28 y.o. male who was diagnosed with COVID-19, and CVA with resultant quadriplegia. Pt. Was then hospitalized with VRE UTI. PMHx includes: urinary retention, seizure disorder, obstructive sleep disorder, DM Type II, Morbid obesity.   PRECAUTIONS: None  WEIGHT BEARING RESTRICTIONS No  PAIN:  No reports of pain Are you having pain? 0/10  FALLS: Has patient fallen in last 6 months? No  LIVING ENVIRONMENT: Lives with: lives with their family Lives in: House/apartment Stairs: No Level Entry Has following equipment at home: Wheelchair (power) and hoyer lift, sit to stand lift  PLOF: Independent  PATIENT GOALS: To be able to engage in more daily care tasks.  OBJECTIVE:   HAND DOMINANCE: Right  ADLs:  Transfers/ambulation related to ADLs: Eating: Pt. reports being able to hold standard utensils, and is starting to engage more in self-feeding tasks, hand to mouth patterns. Pt. Reports that he does as much as he can with the task, and family assists  with the remainder of the task. Grooming: Pt. Is able to initiate holding an electric toothbrush, and brush his teeth. Family assists LB  Dressing: Total Assist UE dressing: Pt. is now able to reach up to actively assist with grasping , and pulling his gown down. Toileting:  Total Assist Bathing: MaxA UB, Total assist LB Tub Shower transfers: N/A Equipment: See above    IADLs: Shopping: Relies on family to assist Light housekeeping: Total Assist Meal Prep: Total Assist Community mobility:   Medication management:  Total Assist  Financial management: N/A Handwriting: Not legible: Pt. Is able to hold a pen with the left hand, and initiate marking the page. Pt.'s eye glasses were not  available  MOBILITY STATUS:  Power w/c  POSTURE COMMENTS:  Pt. Requires position changes in his power w/c  ACTIVITY TOLERANCE: Activity tolerance:  Fair  FUNCTIONAL OUTCOME MEASURES: FOTO: 40  TR score: 45  05/25/2022:   FOTO: 50 TR score: 45  07/20/22: FOTO 55  03/08/23: 51  UPPER EXTREMITY ROM     Active ROM Left eval Left  03/21/2022 Left 05/25/2022 L  07/20/22 Left 07/25/2022 Left  08/15/2022 Left  09/26/2022 Left  11/16/2022 Left 12/28/2022 Left 03/13/23 Left 05/10/23  Shoulder flexion 119 123 125 130  136 138 130 142 144 144  Shoulder abduction 110 115 115 115  118 121 125 142 140 140  Shoulder adduction             Shoulder extension             Shoulder internal rotation             Shoulder external rotation             Elbow flexion 135(135) 145 145 145  145    145 145  Elbow extension -27(-18) -21(-20) -20(-16)  -25(-10) -23(-10) -21(-10) -20(-18) -20(-18) -20(-17) -17(-16)  Wrist flexion             Wrist extension 10(50) 19(50) 28(50)  30(50) 35(55) 36(55) 36(60) 40(60) 45(60) 45(60)  Wrist ulnar deviation             Wrist radial deviation             Wrist pronation             Wrist supination   Limited by flexor tone       10(15)   (Blank rows = not tested)    Active ROM Right eval Right 03/21/2022 Right 05/25/2022 R  07/20/22 Right  07/25/22 Right 08/15/2022 Right 09/26/2022 Right 11/16/2022 Right 12/28/2022 Right 03/13/23 Right 05/10/23  Shoulder flexion 106 scaption 105  Scaption 62 flexion 110 scaption 100  105 105 105 112 Scaption 123 Scaption 122  Scaption  Shoulder abduction 114 90 97 100  105 117 120 124 124 122  Shoulder adduction             Shoulder extension             Shoulder internal rotation             Shoulder external rotation             Elbow flexion 120(130) 145 145 140  144 140 142 148 148 145  Elbow extension -45(-35) -22(-35) -28(-22)  -32(-21) -32(-28) -28(-26) -28(-28) -27(-26) -27(-40) -38(-35)  Wrist flexion              Wrist extension -30(10) -25(10) -10(30)  -10(20) -10(25) -20(40) -30(10) -22(34) -22(35) -32(22)  Wrist ulnar deviation             Wrist radial deviation  Wrist pronation             Wrist supination   Limited by flexor tone            12/28/2022: Thumb radial abduction: Right: 12(46), Left: 40(54)  Digits: Bilateral 2nd through 5th digit: MP Hyperextension with PIP, an DIP flexion     UPPER EXTREMITY MMT:     Left eval Left 03/21/2022 Left  05/25/2022 L 07/20/22 Left  08/15/2022 Left 09/26/2022 Left 11/16/2022 Left 12/28/2022 Left  03/13/2023 Left  05/10/23  Shoulder flexion 3-/5 3/5 3+/5 4+/5 4+/5 4+/5 4+/5 4+/5 4+/5 4+/5  Shoulder abduction 3-/5 3-/5 4-/5 4+/5 4+/5 4+5 4+/5 4+/5 4+/5 4+/5  Shoulder adduction            Shoulder extension            Shoulder internal rotation            Shoulder external rotation            Elbow flexion 3/5 3+/5 4-/5 4+/5 5/5 5/5 5/5 5/5 5/5 5/5  Elbow extension 2-/5 2/5  4+/5 4+/5 4+/5  4/5 4/5 4/5 through available range  Wrist flexion            Wrist extension 2/5 2+/5 3-/5  3-/5 3-/5 3-/5 3/5 3/5 3/5  Wrist ulnar deviation            Wrist radial deviation            Wrist pronation            Wrist supination            (Blank rows = not tested)  Right eval Right 03/21/2022 Right  05/25/2022 R 07/20/22 Right 08/15/2022 Right 09/26/2022 Right 11/16/2022 Right 12/28/2022 Right 03/13/2023 Right 05/10/23  Shoulder flexion 3-/5 scaption 3-/5 3-/5 3+/5 3+/5 3+/5 3/5 3/5 3/5 3+/5  Shoulder abduction 3-/5 3-/5 3-/5 4/5 4/5 4/5 4/5 4/5 4/5 4/-5  Shoulder adduction            Shoulder extension            Shoulder internal rotation            Shoulder external rotation            Elbow flexion 3/5 3/5 3+/5 4/5 4+/5 4+/5 4+/5 4+/5 4+/5 4+/5  Elbow extension 2-/5 2/5 3-/5 4+/5 4+/5 4+/5 4+/5 4-/5 4-/5 4-/5  Wrist flexion            Wrist extension 2-/5 2-/5 2/5 2/5 2/5 2/5 2/5 2/5 2/5 2/5  Wrist ulnar deviation             Wrist radial deviation            Wrist pronation            Wrist supination              HAND FUNCTION:  Grip strength: Right: 0 lbs; Left: 0 lbs and Lateral pinch: Right: 5 lbs, Left: 2 lbs  03/21/2022: Lateral pinch: Right: 3.5 lbs, Left: 2 lbs  07/20/22: Grip strength: Right: 3 lbs; Left: 4 lbs and Lateral pinch: Right: 5 lbs, Left: 3 lbs  08/15/2022:  Grip strength: Right: 0 lbs; Left: 5 lbs and Lateral pinch: Right: 2 lbs, Left: 4 lbs  09/26/2022  Grip strength: Right: 0 lbs; Left: 8 lbs and Lateral pinch: Right: 2 lbs, Left: 5 lbs  11/16/2022  Grip strength: Right: 0 lbs; Left: 8 lbs  9/25/20204  Grip strength: Right: 0 lbs; Left: 8 lbs  Grip strength: Right: 0 lbs; Left: 8 lbs   03/08/23:  Grip strength: Right: 0 lbs without PROM, 5 lbs after PROM, Left 8 lbs  05/10/23:  Grip strength: TBD  Bilateral digit PIP/DIP flexion contractures with MP hyperextension with attempts for AROM. Pt. is able to tolerate AROM to the bilateral digits at the initial evaluation however, has a history of pain in the digits.  COORDINATION: Eval: Pt. is unable to grasp 9-hole test pegs. Pt. is able to initiate grasping larger pegs, and is able to hold a pen in the left hand.  07/20/22: 2 min 36 seconds to remove 9 pegs from 9 hole peg test - cues to locate pegs 2/2 low vision. Pt. is able to initiate grasping larger pegs on R hand and is able to hold a pen in the left hand.  08/15/2022:    Vision, and sensation limiting accuracy of 9 hole peg test results. Pt. Was able to grasp and remove vertical pegs intermittently with cues.    05/10/23: TBD  SENSATION: Light touch: Impaired   EDEMA:  N/A  MUSCLE TONE: BUE flexor Spasticity  COGNITION Overall cognitive status: Continue to assess in functional context  VISION:   Subjective report: Pt. was not wearing glasses at the time of the initial eval.  Baseline vision: Vision is very limited. Wears glasses all the time Visual  history: History of impaired vision following CVA. Pt. Has received treatment through the [facility] low vision rehabilitation program.   VISION ASSESSMENT: Impaired To be further assessed in functional context  PERCEPTION: Impaired   PRAXIS: Impaired: motor planning  OBSERVATIONS:  Pt reports being on Tramadol   TODAY'S TREATMENT    Therapeutic Ex.:   Emphasis was placed on slow prolonged gentle passive stretching for RUE, forearm supination, bilateral wrist extension, wrist extension, digit MP, PIP, and DIP extension following moist heat. ROM was performed to decrease stiffness, and prepare the RUE for functional use. ROM was performed to the shoulder right elbow, forearm, wrist, and digits. PROM for bilateral digit MP, PIP, and DIP extension in preparation for placing them onto a flat surface    PATIENT EDUCATION: Education details: stretching, PROM, massage to the digits on the bilateral hands. Person educated: Patient and Parent Education method: Explanation Education comprehension: verbalized understanding  HOME EXERCISE PROGRAM:  Continue ongoing assessment, and continue to provide as needed.   GOALS: Goals reviewed with patient? Yes  SHORT TERM GOALS: Target date: 11/07/2022   To assess splint fit, and make appropriate adjustments to promote good skin integrity through the palmar surface of the bilateral hands.  Baseline: 05/25/22: Goal currently met, however ongoing as needs to assess splint fit arise. 03/23/2022: Pt. is wearing splints a couple of hours at night bilateral resting hand splints. 03/21/2022: Pt. is wearing splints a couple of hours at night bilateral resting hand splints. Goal status: Deferred   LONG TERM GOALS: Target date: 08/02/2023    FOTO score will Improve by 2 points for Pt. perceived improvement with the assessment specific ADL/IADL tasks.  Baseline:  03/08/23: 51; 12/28/2022: FOTO score: 56; 11/16/2022: 52 09/27/2022: 51 07/20/2022: FOTO 55 05/25/2022: FOTO  score: 50,  TR score: 45 Eval: FOTO score: 40,  TR score: 45 Goal status: Achieved   2.   Pt. will independently perform oral care for 100% of the task after complete set-up. Baseline: 05/10/23: Pt. Performs 90% of the task. Pt.'s mother assists with 10% of the task  for thorough cleaning the hard to reach places way in the back of the oral cavity.03/08/23: not as much assist required proximally, but to adjust positioning of toothbrush; 02/13/23: Pt. Continues to require assist proximally 01/09/2023: Continue 12/28/2022:  Pt. Continues to complete 90% of the task with complete set-up, with visual cues, and occassional assist of the right elbow. 11/16/2022: Pt. Completes 90% of the task with complete set-up. 09/26/2022: Completes 90% of the task, proximal assist at times required at the elbow.  08/15/2022: Completes 90% of the task, proximal assist at times required at the elbow. 07/20/22: completes 90% of task, limited by shoulder flexion. 05/25/2022:  Pt. Is able to initiate and perform oral care for approximately 90% of the task. Complete set-up required. Assi needed only for the very back teeth. 03/23/2022: Pt. Is able to initiate and perform oral care for approximately 75% of the task. Pt. Requires assist at proximally at the elbow for through oral care. 03/21/2022: Pt. Is able to initiate and perform oral care for approximately 75% of the task. Pt. Requires assist at proximally at the elbow for through oral care. Eval: Pt. is able to initiate using an electric toothbrush. Pt. requires assist for set-up, and assist for thoroughness, and as he Pt. fatigues. Goal status:  Partially met, Goal discontinued  3.  Pt. Will be modified independence with self-feeding for 100% using a swivel spoon, and standard fork after complete set-up Baseline: 05/10/2023: Pt. feeds self 95-100% of the meal. with occasional assist to reposition the utensil. 03/08/23: indep with cup with a handle and lid, assist to reposition eating  utensils in hand;  02/13/23:  Pt. Is now able to consistently, and efficiently use a swivel spoon for eating cereal with the left hand.01/09/2023: Continue 12/28/2022: Pt. Is now feeding himself 90% of the time using a swivel spoon with the left hand, and 95% of the time with the fork. Pt. Continues to have difficulty with cutting food. 11/16/2022: 100% with finger foods, Pt. Is initiating with a swivel spoon, and universal cuff for fork use, however requires assist for consistency, and accuracy.  09/26/2022: 90% using the spoon, assist at time with the forearm motion, and grip on the utensils. 100% for finger foods.  08/15/2022:  90% using the spoon, assit at time with the forearm motion, and grip on the utensils. 100% for finger foods-independent with set-up- unsupervised.  07/20/22: self-feeds cereal using spoon 90% of task. 05/25/2022: Pt. Is able to use a spoon to scoop cereal when feeding himself cereal 85% of the time. Pt. Is able to feed himself snack/finger foods 100% of the time. Pt. Continues to work on consistency of  stabilizing a cup/mug when drinking. Pt. Is able to grasp a water bottle with assist initially, with assist tapering off as he drinks.03/23/2022: Pt. Is able to perform scooping cereal for 75% of the time. Pt. required assist, and support at the left elbow, and Pt. Presents with limited forearm supination when using the spoon, and bringing it towards his mouth. Pt. Is able to use a fork to spear items, and perform the hand to mouth pattern.  03/21/2022: Pt. Is able to perform scooping cereal for 75% of the time. Pt. required assist, and support at the left elbow, and Pt. presents with limited forearm supination when using the spoon, and bringing it towards his mouth. Pt. Is able to use a fork to spear items, and perform the hand to mouth pattern.  Eval: Pt. is able to hold standard standard  utensils. Pt. Performs as much of the task as he, can and has assistance for the remainder. Goal status:   Improved, Achieved  4.  Pt. will improve grasp patterns and consistently grasp 1/4" objects for ADL, and IADL tasks.  Baseline: 05/31/2023: Continue 05/10/23: Pt. is consistently able to grasp 1/2" objects with visual cues. Pt. Requires assist, and increased visual cues to grasp 1/4" objects on an elevated plane.  Pt. Is unable to grasp the flat at the tabletop surface. Pt. Is grasping grasping small puppy vitamins.03/08/23: 1/2" objects efficiently; 02/13/2023: 1/2" objects efficiently 01/09/2023: Continue 12/28/2022: Continues to grasp 1/4" pegs with min a + visual cues, consistently grasping 1/2" objects with visual cues 11/16/2022: Continues to grasp 1/4" pegs with min a + visual cues, consistently grasping 1/2" objects with visual cues 09/26/2022: Continues to grasp 1/4" pegs with min a + visual cues, consistently grasping 1/2" objects with visual cues. 08/15/2022: grasps 1/4" pegs with min a + visual cues, consistently grasping 1/2" objects with visual cues. 07/20/22: grasps 1/4" pegs with min a + visual cues, consistently grasping 1/2" objects with visual cues. 05/25/2022: Pt. Is working on improving consistency of grasping 1/2" objects with visual cues.  03/23/2022: Pt. Is able to grasp 1" objects consistently,and continues to work on the hand patterns needed to grasp 1/2" objects.03/21/2022: Pt. Is able to grasp 1" objects consistently,and continues to work on the hand patterns needed to grasp 1/2" objects. Eval: Pt. is able to grasp 1" objects intermittently using a lateral grasp pattern. Goal status: Ongoing  5.  Pt. will independently write his name legibly with letter sizes under 1". Baseline: 05/10/23: Deferred 03/08/23: Not addressed this period; 02/13/2023: Continue 01/09/2023: Continue. 12/28/2022: Pt. is now using an IPad Pen. 11/16/2022: Pt. Continues to be able to write name with smaller letter size. Pt. Is signing name with a computer styus. Pt. Requires visual cues, and assist 09/26/2022: Pt.  Continues to be able to write name with smaller letter size. Pt. Is signing name with a computer styus. Pt. Requires visual cues, and assist. 08/15/2022: Pt. Is able to write name with smaller letter size. Pt. Is signing name with a computer styus. Pt. Requires visual cues, and assist. 07/20/22: stabilizing assist to write name with 75% legibility with 2" letters. 05/25/2022: Pt. to continue to work towards formulating grasp patterns in preparation for grasping a large width pen. Pt. Requires visual cues. 03/23/2022: Pt. Is able to write his name with modA, however has difficulty with formulating letter sizes less than 2" in size with 50% legibility for the 3 letters of his name.03/21/2022: Pt. Is able to write his name with modA, however has difficulty with formulating letter sizes less than 2" in size with 50% legibility for the 3 letters of his name. Eval: Pt is able to hold a thin marker with his left hand, and formulate a line, and initiate a circular pattern (Pt. without glasses today) Goal status: Deferred  6. Pt. Will reach up to comb/brush his hair with minA.  Baseline: 05/10/23: Pt. is able to use a pick independently with the left hand with occasional assist to the far side of the left his head. 03/08/23: Pt uses pick in L hand to reach 75% of his head; unable to reach R side of head; 02/13/2023:Pt. is able to use a hair pick with the left hand on the right side of his head, top, and back of the head. Pt. Continues to have difficulty reaching further to the right side  of his head with the left. 01/09/2023: Continue 12/28/2022: Pt. Is progressing, and is now able to use a hair pick with the left hand on the right side of his head, top, and back of the head. Pt. Continues to have difficulty reaching further to the right side of his head with the left. 11/16/2022: Pt. Is able to use a hair pick for the left side of his head using his left hand. Pt. Presents with difficulty reaching to the right side of his  head.09/26/2022: Pt. Is able to use a hair pick for the left side of his head using his left hand. Pt. Presents with difficulty reaching to the right side of his head. 5/13/202: 75% using a hair pick, assist to the right side of the head. 07/20/22: reaches 75% of head, assist for far R side of head, fatigues quickly. 05/25/2022: Pt. Is able to reach up with the left hand to the left side, top, and back of his head. 03/23/2022: Pt. is now able to more consistently initiate reaching up to his head with his left hand in preparation for haircare12/18/2023: Pt. is now able to more consistently initiate reaching up to his head with his left hand in preparation for haircare. Eval: Pt. is able to initiate reaching up for hair care with a long handled brush, however is unable to sustain UE's in elevation to perform the task.     Goal status: Achieved  7. Pt. Will independently navigate the w/c through his environment with minA with visual scanning, and hand placement on the controls.  Baseline: 11/16/2022: Deferred 2/2 vision 09/26/2022: Pt. Is now navigating his w/c ouside the home, and around his block. 08/15/2022: Pt. Requires minA to setup hand on controls and MIN cues to navigate the w/c in wide spaces, requires MIN - MOD cues to navigate the w/c through more narrow doorways, and tighter turns - varies based on good vs bad vision days.07/20/22: Pt. Requires minA to setup hand on controls and MIN cues to navigate the w/c in wide spaces, requires MIN - MOD cues to navigate the w/c through more narrow doorways, and tighter turns - varies based on good vs bad vision days. 05/25/2022: Pt. Requires minA  and  cues to navigate the w/c in wide spaces, and requires Mod cues to navigate the w/c through more narrow doorways, and tighter turns. Pt. Requires max cues for scanning through the environment, and moderate cues for hand placement on the controls.     Goal status: Deferred 2/2 vision    8. Pt. Will improve bilateral grip  strength to be able to independently grasp, and pull up blankets, and linens.   Baseline: 05/10/23: Deferred 03/08/23: R grip 0-5 lbs, L grip 8 lbs; pt reports he can consistently pull up blankets and linens independently.  02/13/2023: Continue. Pt. presents with digit PIP, and DIP flexor tightness. 01/09/2023: Continue 12/28/2023: R: 0#, L: 5# Pt. Is now able to more consistently grasp and pull blankets up over him. 11/16/2022: R: 0 L: 8# Pt. Is more consistently grasping his blankets and attempting to pull them up. 09/26/2022: R: 0  L: 8# Pt. is attempting to grasp, and pull blankets up more, using mostly the left hand. 08/15/2022: Pt. has difficulty securely holding, and pulling up blankets, and linens.    Goal status:Deferred  9. Pt. will consistently actively control the releasing of blankets, covers, and linens from his hands once they are in the desired position over him. Baseline:12/28/2022: Pt. Is consistently actively  able to release linens once they are in the desired position over him. 11/16/2022: Pt. continues to improve with actively releasing blankets form his hands. 09/26/2022: Pt. is improving with actively releasing blankets form his hands. 08/15/2022: Pt. has difficulty with controlled releasing of blankets/linens from his grasp.     Goal status: Achieved    10. Pt. Will improve bilateral UE strength by 2 MM grades to assist with ADLs, and IADLs  Baseline: 05/31/2023: Continue 05/10/23: Right: shoulder flexion: 3+/5, abduction: 44-/5, elbow flexion: 4+/5 , extension: 4-/5 wrist extension: 2/5; Left: shoulder flexion: 4+/5, abduction: 4+/5, elbow flexion: 5/5 , extension: 4/5  wrist extension: 3/5 03/13/2023: Right: shoulder flexion: 3/5, abduction: 4/5, elbow flexion: 4+/5 , extension: 4-/5 wrist extension: 2/5; Left: shoulder flexion: 4+/5, abduction: 4+/5, elbow flexion: 5/5 , extension: 4/5  wrist extension: 3/5 03/08/23: NT this date d/t as pt arrived late; 02/13/2023: Continue 01/09/2023:  Continue 12/28/2022: Right: shoulder flexion: 3/5, abduction: 4/5, elbow flexion: 4+/5 , extension: 4-/5 wrist extension: 2/5; Left: shoulder flexion: 4+/5, abduction: 4+/5, elbow flexion: 5/5 , extension: 4/5  wrist extension: 3/5 Right: shoulder flexion: 3+/5, abduction: 4/5, elbow flexion: 4+/5 , extension: 4+/5 wrist extension: 2/5; Left: shoulder flexion: 4+/5, abduction: 4+/5, elbow flexion: 5/5 , extension: 4+/5 wrist extension: 3-/5 11/16/2022: Right: shoulder flexion: 3/5, abduction: 4/5, elbow flexion: 4+/5 , extension: 4+/5 wrist extension: 2/5; Left: shoulder flexion: 4+/5, abduction: 4+/5, elbow flexion: 5/5 , extension:  wrist extension: 3-/5 Right: shoulder flexion: 3+/5, abduction: 4/5, elbow flexion: 4+/5 , extension: 4+/5 wrist extension: 2/5; Left: shoulder flexion: 4+/5, abduction: 4+/5, elbow flexion: 5/5 , extension: 4+/5 wrist extension: 3-/5 Goal status: Ongoing  11. Pt. will improve right wrist extension by 10 degrees to initiate reaching for items in preparation for ADLs. Baseline: 05/31/23: Continue 05/10/23: Right wrist extension: -32(22) 03/13/23: -22(35) 03/08/23: NT this date as pt arrived late; 02/13/2023: Continue 01/09/2023: Continue 12/28/2022: -22(34) 11/16/2022: -30(10)    Goal status: Ongoing      12. Pt. Will require donn/doff a Tank top T-shirt efficiently with supervision and full set-up.    Baseline: 05/31/2023: Pt. Requires maxA donning a pullover sweatshirt, Mod-maxA doffing it. T-shirt tank TBD 05/10/23: Pt. Presents with difficulty completing, and requires increased time to complete    Goal status: New    13. Pt. Will will independently perform bilateral/bimanual hand tasks needed to fold towels/linens/laundry Baseline:05/31/23: Continue 05/10/23:  Pt. Has difficulty folding towels/lines/laundry using bilateral hands Goal status: New  14. Pt. Will independently, and efficiently reach his right arm over the side of the chair to pet his dog.    Baseline: 05/31/2023:  Continue 05/10/23: Pt. Is unable to efficiently reach his right UE over the side of his recliner chair to pet his dog.    Goal status: New    15. Pt. Will independently, and efficiently reach out in front of him to efficiently place items onto his table while sitting in his recliner.     Baseline: 2/26/225: Continue 05/10/23: Pt. has difficulty reaching out in front of him far enough to efficiently place items on the table while sitting in a recliner chair.    Goal status: New       ASSESSMENT:  CLINICAL IMPRESSION:  Pt. was late for the OT session this afternoon due to transportation being late arriving to pick them up. Pt. reports having an appointment scheduled for Botox Injections next Wednesday. Pt. presents with increased flexor tone/tightness 2/2 spasticity in the right UE limiting forearm supination, wrist extension,  and digit PIP/DIP extension. Pt. tolerated the exercises well. Pt. continues to be highly motivated to continue working towards improving BUE functioning, ROM, strength, motor control, [facility] skills, as well as visual compensatory strategies in order to maximize independence with daily ADLs, and IADL functioning.      PERFORMANCE DEFICITS in functional skills including ADLs, IADLs, coordination, dexterity, proprioception, ROM, strength, pain, FMC, GMC, decreased knowledge of use of DME, and UE functional use, cognitive skills including safety awareness, and psychosocial skills including coping strategies, environmental adaptation, habits, and routines and behaviors.   IMPAIRMENTS are limiting patient from ADLs, IADLs, leisure, and social participation.   COMORBIDITIES may have co-morbidities  that affects occupational performance. Patient will benefit from skilled OT to address above impairments and improve overall function.  MODIFICATION OR ASSISTANCE TO COMPLETE EVALUATION: Maximum or significant modification of tasks or assist is necessary to complete an evaluation.  OT  OCCUPATIONAL PROFILE AND HISTORY: Comprehensive assessment: Review of records and extensive additional review of physical, cognitive, psychosocial history related to current functional performance.  CLINICAL DECISION MAKING: High - multiple treatment options, significant modification of task necessary  REHAB POTENTIAL: Fair    EVALUATION COMPLEXITY: High    PLAN: OT FREQUENCY: 2 x's a week  OT DURATION:12 weeks  PLANNED INTERVENTIONS: self care/ADL training, therapeutic exercise, therapeutic activity, neuromuscular re-education, manual therapy, passive range of motion, patient/family education, and cognitive remediation/compensation RECOMMENDED OTHER SERVICES: PT  CONSULTED AND AGREED WITH PLAN OF CARE: Patient and family member/caregiver  PLAN FOR NEXT SESSION: Review HEP  Olegario Messier, MS, OTR/L  06/21/2023

## 2023-06-22 NOTE — Therapy (Signed)
 OUTPATIENT PHYSICAL THERAPY TREATMENT     Patient Name: Paul Hall MRN: [id number] DOB:[birth year], 28 y.o., male Today's Date: 06/22/2023  PCP:  Cindi Carbon PROVIDER:  Lenise Herald, PA-C   PT End of Session - 06/21/23 0911     Visit Number 151    Number of Visits 160   date corrected from most recent recert   Date for PT Re-Evaluation 08/23/23    Authorization Type [employer] [place] [employer]    Authorization Time Period 01/04/21-03/29/21; Recert 03/24/2021-06/16/2021; Recert 09/15/2021- 12/08/2021; Recert 12/13/2021-03/07/2022    Progress Note Due on Visit 160    PT Start Time 1615    PT Stop Time 1700    PT Time Calculation (min) 45 min    Equipment Utilized During Treatment Gait belt    Activity Tolerance Patient tolerated treatment well    Behavior During Therapy WFL for tasks assessed/performed                            Past Medical History:  Diagnosis Date   Diabetes mellitus (HCC)    Hypertension    Stroke (HCC)    Past Surgical History:  Procedure Laterality Date   IVC FILTER PLACEMENT ([facility] HX)     LEG SURGERY     PEG TUBE PLACEMENT     TRACHEOSTOMY     Patient Active Problem List   Diagnosis Date Noted   Sepsis due to vancomycin resistant Enterococcus species (HCC) 06/06/2019   SIRS (systemic inflammatory response syndrome) (HCC) 06/05/2019   Acute lower UTI 06/05/2019   VRE (vancomycin-resistant Enterococci) infection 06/05/2019   Anemia 06/05/2019   Skin ulcer of sacrum with necrosis of muscle (HCC)    Urinary retention    Type 2 diabetes mellitus without complication, with long-term current use of insulin (HCC)    Tachycardia    Lower extremity edema    Acute metabolic encephalopathy    Obstructive sleep apnea    Morbid obesity with BMI of 60.0-69.9, adult (HCC)    Goals of care, counseling/discussion    Palliative care encounter    Sepsis (HCC) 04/27/2019   H/O insulin dependent diabetes mellitus 04/27/2019   History of  CVA with residual deficit 04/27/2019   Seizure disorder (HCC) 04/27/2019   Decubitus ulcer of sacral region, stage 4 (HCC) 04/27/2019    REFERRING DIAG: Cerebral infarction, unspecified   THERAPY DIAG:  Muscle weakness (generalized)  Other lack of coordination  Difficulty in walking, not elsewhere classified  Unsteadiness on feet  Abnormality of gait and mobility  Rationale for Evaluation and Treatment Rehabilitation  PERTINENT HISTORY: Paul Hall is a 26yoM who presents with severe weakness, quadriparesis, altered sensorium, and visual impairment s/p critical illness and prolonged hospitalization. Pt hospitalized in October 2020 with ARDS 2/2 COVID19 infection. Pt sustained a complex and lengthy hospitalization which included tracheostomy, prolonged sedation, ECMO. In this period pt sustained CVA and SDH. Pt has now been liberated from tracheostomy and G-tube. Pt has since been hospitalized for wound infection and UTI. Pt lives with parents at home, has hospital bed and left chair, hoyer lift transfers, and power WC for mobility needs. Pt needs heavy physical assistance with ADL 2/2 BUE contractures and motor dysfunction   PRECAUTIONS: Fall  SUBJECTIVE:  Patient reports doing well today but stressed secondary to transportation issues.   PAIN:  Are you having pain? No. But feeling tight.    TODAY'S TREATMENT:  - 06/14/2023     *  Patient dependently hoyer transfer from his power chair to edge of mat with +2 assist (mom +PT) and later same assist for transfer back to w/c from mat (using patient provided hoyer pad) and then at end of visit transport via hoyer back from EOM back to power w/c  THERAPEUTIC ACTIVITIES:  - EOM- static sitting initially to make sure he was feeling okay- Some tone and restlessness in Right foot.  -Dynamic weight shifting placing "peanut therball" on one side for forearm support and then patient instructed to "press down" using forearm into ball- hold a  couple of seconds 2 sets x 15 reps today.  - anterior weight shifting at EOM x 10 reps -Some active LE movement initiated - knee ext and hip march but patient began to feel stressed and requested to got back to chair.                PATIENT EDUCATION: Education details: Exercise technique with VC   HOME EXERCISE PROGRAM:  Access Code: [email or web address] URL: [email or web address] Date: 03/23/2022 Prepared by: Maureen Ralphs  Exercises - Supine Bridge  - 3 x weekly - 3 sets - 10 reps - 2 hold - Supine Gluteal Sets  - 3 x weekly - 3 sets - 10 reps - 5 sec hold - Supine Quad Set  - 1 x daily - 3 x weekly - 3 sets - 10 reps - 5 hold - Seated Long Arc Quad  - 1 x daily - 7 x weekly - 3 sets - 10 reps - Seated Hip Adduction Squeeze with Ball  - 1 x daily - 3 x weekly - 3 sets - 10 reps - 5 hold - Seated Hip Abduction  - 1 x daily - 3 x weekly - 3 sets - 10 reps - 2 hold    PT Short Term Goals -       PT SHORT TERM GOAL #1   Title Pt will be independent with HEP in order to improve strength and balance in order to decrease fall risk and improve function at home and work.    Baseline 01/04/2021= No formal HEP in place; 12/12 no HEP in place; 05/10/2021-Patient and his father were able to report compliance with curent HEP consisting of mostly seated/reclined LE strengthening. Both verbalize no questions at this time.    Time 6    Period Weeks    Status Achieved    Target Date 02/15/21            PT LONG TERM GOAL #1     Title Patient will increase BLE gross strength by 1/2 muscle grade to improve functional strength for improved independence with potential gait, increased standing tolerance and increased ADL ability.     Baseline 01/04/2021- Patient presents with 1/5 to 3-/5 B LE strength with MMT; 12/12: goal partially met for Left knee/hip; 05/10/2021= 2-/5 bilateal Hip flex; 3+/5 bilateral Knee ext; 06/21/2021= Patient presents with 2-/5 bilateral Hip flex; 3+/5 bilateral  knee ext/flex; 2-/5 left ankle DF; 0/5 right ankle- and able to increase reps and resistance with LE's. 09/15/2021- Patient technically presents with 2-/5 B hip flex/abd/add - but he is able to raise his hip up to approx 100 deg which has improved. 3+/5 Bilateral knee ext, 2-/5 left ankle and 0/5 right ankle.  12/08/2021= Patient able to lift left knee at 110 deg of hip flex; presents with 3+/5 knee ext, 2-/5 left ankle DF and 0/5 right ankle DF, 2-/5 bilateral Hip abd in seated position.  12/6: R: knee 3+/5 ext, 2/5 flexion, left knee 3+/5 extension, 3+/5 flexion, R hip: 2+/5 hip add, 2+/5 hip ABD L hip: 4-/5 hip ABD, 3+/5 hip ADD, 3+/5 hip flexion; 06/06/2022= Patient now presents with 2-/5 right ankle DF/PF;     Time 12     Period Weeks     Status MET    Target Date 03/07/2022         PT LONG TERM GOAL #2    Title Patient will tolerate sitting unsupported demonstrating erect sitting posture for 15 minutes with CGA to demonstrate improved back extensor strength and improved sitting tolerance.     Baseline 01/04/2021- Patient confied to sitting in lift chair or electric power chair with back support and unable to sit upright without physical assistance; 12/12: tolerates <1 minutes upright unsupported sitting. 05/10/2021=static sit with forward trunk lean  in his power wheelchair without back support x approx 3 min. 06/21/2021=Unable to assess today due to patient with acute back pain but on previous visit able to sit x 8 min without back support. 09/15/2021- on last visit- 09/13/2021- patient was able to sit unsupported x 8 min at edge of mat. 10/13/2023 - Patient was able to sit at edge of mat with varying level of assist today from SBA to min A for a total of 20 min. 12/13/2021= Patient demonstrated unsupported sitting at edge of mat for approx 20 min    Time 12     Period Weeks     Status GOAL MET    Target Date 12/08/21          PT LONG TERM GOAL #3    Title Patient will demonstrate ability to perform  static standing in // bars > 2 min with Max Assist  without loss of balance and fair posture for improved overall strength for pre-gait and transfer activities.     Baseline 01/04/2021= Patient current uanble to stand- Dependent on hoyer or sit to stand lift for transfers. 05/10/2021=Not appropriate yet- Currently still dependent with all transfers using hoyer. 06/21/2021= Patient continuing now to focus on LE strengthening to prepare for standing-unable to try today due to acute low back pain-  planning on attempting in new cert period. 09/15/2021- Patient has attempted standing 2x in past two week- max Assist of 2 people - only once was he successful to clearing his bottom from chair - Will continue to be a focus during the new certification. 12/13/2021= Patient has been limited secondary to increased overall low back pain during this certification and will require more time to focus on this goal.  12/6: not assessed this date, will assess at date when 2-3 PTs are present for assistance     Time 12     Period Weeks     Status GOAL not appropriate at this time - may attempt in future once Patient presents with improved overall LE strength.                 PT LONG TERM GOAL #4    Title Pt will improve FOTO score by 10 points or more demonstrating improved perceived functional ability     Baseline FOTO 7 on 10/17; 03/15/21: FOTO 12; 05/10/2021 06/21/2021= 1; 09/15/2021= 9; 12/13/2021= Will issue next visit 12/6: 4; 06/06/2022= Will assess next visit; Goal not appropriate as patient is not ambulatory.     Time 12     Period Weeks     Status WITHDRAWN    Target date 11/30/2022  PT LONG TERM GOAL #5    Title Patient will perform sit to stand transfer with appropriate AD and max assist of 2 people with 75% consistency to prepare for pregait activities.     Baseline 09/15/2021= Patient unable to stand well- unable to clear his bottom off chair with Max assist of 2 persons. 12/13/2021- Goal not appropriate to try  yet but will keep and roll over to next cert as shift continues to focus on transfers/standing; 4/24= Patient able to perform active ankle DF/PF with right LE and able to raise his knee into seated march and clear floor without physical assist today - as previously unable as well as lift right knee ext to near full ROM to improve strength for eventual standing. 09/07/2022- Goal continues to not be appropriate but patient is now standing some and will keep goal active; 11/23/2022= patient is now performing sit to stand with max A +2 and now able to clear his bottom off mat. 11/30/2022- Patient continues to perform sit to stand transfers with Max A+2 and able to clear bottom well off mat but not erect yet. 05/31/2023- Patient is able to stand consistently with max A +2 - unable to achieve full knee or trunk ext yet is able to clear bottom off mat for improved LE weightbearing.     Time 12     Period Weeks     Status PROGRESSING    Target date  08/23/2023       PT LONG TERM GOAL #6  Title Patient will tolerate sitting unsupported demonstrating erect sitting posture for 30 minutes with CGA to demonstrate improved back extensor strength and improved sitting tolerance.   Baseline 12/13/2021= Patient demonstrated unsupported sitting at edge of mat for approx 20 min;  12/29/2021- Patient performed approx 30 min of dynamic sitting activities today. 06/06/2022= Patient demonstrated ability to sit and perform static and dynamic UE/LE movement with only Supervision.   Time 12   Period Weeks   Status GOAL MET           7.  Patient will tolerate 2 minutes or more of standing in sit to stand lift or with max assist +2 in order to indicate improved lower extremity weightbearing tolerance for progression to standing in parallel bars. Baseline: 1 minute on most recent stand 02/21/22; 06/06/2022- Patient did attempt today after complaining of right LE pain last week. He attempted 3 stands using sit to stand lift. - all over 1  min- last one approx 48 sec- stopped due to fatigue. More erect standing in lift today - still poor gluteal strength but able to activate glutes and extend hips upon command but unable to hold > 5 sec. 07/28/2022- Will attempt standing next visit but patient has been able to stand since last progress note for up to 3 min at a time at his best. 09/07/2022-  attempted standing with max +2 and patient able to stand 15-20 sec so will now  revise goal; 11/23/2022- Patient at his best between last 2 visits was able to stand approx 50 sec with max A+2.  11/30/2022- Patient performed 4 trials of static standing - max A +2- clearing bottom and using forearms to bear weight  03/15/2023: Patient performed 2 trials of static standing - max A +2- clearing edge of mat and using forearms to bear weight for approximately 45 seconds, is able to come into approximately 45 degrees of knee flexion from sit to stand; 05/31/2023- Patient has not attempted standing  in 1 month but able to perform x 4 trials with max A +2 (PT and his mom) and able to clear his bottom off mat and attempted to activate his glutes and apply some forearm pressure - able to stand between 25-40 sec today.  Goal status: PROGRESSING Target date: 08/23/2023   8. Patient will tolerate sitting unsupported demonstrating ability to perform dynamic UE/LE activities for 30 minutes  independently to demonstrate ability to sit at edge of bed to eat or perform some ADL's/exercise for optimal quality of life.             Baseline: 06/06/2022- Patient able to sit and perform some UE/LE exercises but requires CGA at times for safety- he is able to static sit for 30 min with supervision. 07/27/2022= Patient able to now sit > 30 min with Supervision only - performing static and dynamic activities. Will keep goal active to ensure patient is consistent. 09/07/2022=  Patient able to demonstrate consistent ability to sit at edge of mat during visits             Goal Status: MET              Target date: 08/29/2022        Plan     Clinical Impression Statement Patient presented with initially more comfortable than last visit sitting at the edge of mat table. He was able to perform some UE reaching for edge of mat and some forearm leaning today. He was able to laterally shift his weight without LOB but still uneasy with sitting today. Pt will continue to benefit from skilled physical therapy intervention to address impairments, improve QOL, and attain therapy goals.    Personal Factors and Comorbidities Comorbidity 3+;Time since onset of injury/illness/exacerbation    Comorbidities CVA, diabetes, Seizures    Examination-Activity Limitations Bathing;Bed Mobility;Bend;Caring for Others;Carry;Dressing;Hygiene/Grooming;Lift;Locomotion Level;Reach Overhead;Self Feeding;Sit;Squat;Stairs;Stand;Transfers;Toileting    Examination-Participation Restrictions Cleaning;Community Activity;Driving;Laundry;Medication Management;Meal Prep;Occupation;Personal Finances;Shop;Yard Work;Volunteer    Stability/Clinical Decision Making Evolving/Moderate complexity    Rehab Potential Fair    PT Frequency 2x / week    PT Duration 12 weeks    PT Treatment/Interventions ADLs/Self Care Home Management;Cryotherapy;Electrical Stimulation;Moist Heat;Ultrasound;DME Instruction;Gait training;Stair training;Functional mobility training;Therapeutic exercise;Balance training;Patient/family education;Orthotic Fit/Training;Neuromuscular re-education;Wheelchair mobility training;Manual techniques;Passive range of motion;Dry needling;Energy conservation;Taping;Visual/perceptual remediation/compensation;Joint Manipulations    PT Next Visit Plan core strength/motor control in short-sitting, sit to stand if able; Continue with progressive LE Strengthening. Stand activities as appropriate. Attempt Stedy (sit to stand device) in future visits.   PT Home Exercise Plan No changes to HEP today    Consulted and Agree with Plan  of Care Patient;Family member/caregiver    Family Member Consulted             03/09/2023, 8:04 AM      2:43 PM, 06/22/23 Lenda Kelp PT Physical Therapist - [facility]  2:43 PM 06/22/23

## 2023-06-23 DIAGNOSIS — G4733 Obstructive sleep apnea (adult) (pediatric): Secondary | ICD-10-CM | POA: Diagnosis not present

## 2023-06-26 ENCOUNTER — Ambulatory Visit: Payer: Medicare Other | Admitting: Occupational Therapy

## 2023-06-26 ENCOUNTER — Ambulatory Visit: Payer: Medicare Other

## 2023-06-26 DIAGNOSIS — M6281 Muscle weakness (generalized): Secondary | ICD-10-CM

## 2023-06-26 DIAGNOSIS — R278 Other lack of coordination: Secondary | ICD-10-CM | POA: Diagnosis not present

## 2023-06-26 DIAGNOSIS — I6930 Unspecified sequelae of cerebral infarction: Secondary | ICD-10-CM

## 2023-06-26 DIAGNOSIS — R262 Difficulty in walking, not elsewhere classified: Secondary | ICD-10-CM | POA: Diagnosis not present

## 2023-06-26 DIAGNOSIS — R2681 Unsteadiness on feet: Secondary | ICD-10-CM | POA: Diagnosis not present

## 2023-06-26 DIAGNOSIS — R269 Unspecified abnormalities of gait and mobility: Secondary | ICD-10-CM | POA: Diagnosis not present

## 2023-06-26 DIAGNOSIS — R2689 Other abnormalities of gait and mobility: Secondary | ICD-10-CM

## 2023-06-26 NOTE — Therapy (Signed)
 Occupational Therapy Neuro Treatment Note     Patient Name: Paul Hall MRN: [id number] DOB:September 19, 1995, 28 y.o., male Today's Date: 06/26/2023  PCP: Dr. Sherwood Gambler REFERRING PROVIDER: Dr. Sherwood Gambler    OT End of Session - 06/26/23 1716     Visit Number [id number]    Number of Visits 132    Date for OT Re-Evaluation 08/02/23    OT Start Time 1537    OT Stop Time 1615    OT Time Calculation (min) 38 min    Equipment Utilized During Treatment tilt in space power wc    Activity Tolerance Patient tolerated treatment well    Behavior During Therapy WFL for tasks assessed/performed                        Past Medical History:  Diagnosis Date   Diabetes mellitus (HCC)    Hypertension    Stroke [facility])    Past Surgical History:  Procedure Laterality Date   IVC FILTER PLACEMENT ([facility] HX)     LEG SURGERY     PEG TUBE PLACEMENT     TRACHEOSTOMY     Patient Active Problem List   Diagnosis Date Noted   Sepsis due to vancomycin resistant Enterococcus species (HCC) 06/06/2019   SIRS (systemic inflammatory response syndrome) (HCC) 06/05/2019   Acute lower UTI 06/05/2019   VRE (vancomycin-resistant Enterococci) infection 06/05/2019   Anemia 06/05/2019   Skin ulcer of sacrum with necrosis of muscle (HCC)    Urinary retention    Type 2 diabetes mellitus without complication, with long-term current use of insulin (HCC)    Tachycardia    Lower extremity edema    Acute metabolic encephalopathy    Obstructive sleep apnea    Morbid obesity with BMI of 60.0-69.9, adult (HCC)    Goals of care, counseling/discussion    Palliative care encounter    Sepsis (HCC) 04/27/2019   H/O insulin dependent diabetes mellitus 04/27/2019   History of CVA with residual deficit 04/27/2019   Seizure disorder (HCC) 04/27/2019   Decubitus ulcer of sacral region, stage 4 (HCC) 04/27/2019   ONSET DATE: 01/2019  REFERRING DIAG: CVA/COVID-19  THERAPY DIAG:  Muscle weakness (generalized)  Rationale  for Evaluation and Treatment Rehabilitation  SUBJECTIVE:   SUBJECTIVE STATEMENT:  Pt./caregiver reports they are moving in April, and may have to have some schedule changes during during the month.  Pt accompanied by: self and family member  PERTINENT HISTORY:  Pt. is a 28 y.o. male who was diagnosed with COVID-19, and CVA with resultant quadriplegia. Pt. Was then hospitalized with VRE UTI. PMHx includes: urinary retention, seizure disorder, obstructive sleep disorder, DM Type II, Morbid obesity.   PRECAUTIONS: None  WEIGHT BEARING RESTRICTIONS No  PAIN:  No reports of pain Are you having pain? Discomfort with right wrist, and digit PIP ROM 2/2 increased tightness.  FALLS: Has patient fallen in last 6 months? No  LIVING ENVIRONMENT: Lives with: lives with their family Lives in: House/apartment Stairs: No Level Entry Has following equipment at home: Wheelchair (power) and hoyer lift, sit to stand lift  PLOF: Independent  PATIENT GOALS: To be able to engage in more daily care tasks.  OBJECTIVE:   HAND DOMINANCE: Right  ADLs:  Transfers/ambulation related to ADLs: Eating: Pt. reports being able to hold standard utensils, and is starting to engage more in self-feeding tasks, hand to mouth patterns. Pt. Reports that he does as much as he can with the task, and family  assists  with the remainder of the task. Grooming: Pt. Is able to initiate holding an electric toothbrush, and brush his teeth. Family assists LB Dressing: Total Assist UE dressing: Pt. is now able to reach up to actively assist with grasping , and pulling his gown down. Toileting:  Total Assist Bathing: MaxA UB, Total assist LB Tub Shower transfers: N/A Equipment: See above    IADLs: Shopping: Relies on family to assist Light housekeeping: Total Assist Meal Prep: Total Assist Community mobility:   Medication management:  Total Assist  Financial management: N/A Handwriting: Not legible: Pt. Is able to  hold a pen with the left hand, and initiate marking the page. Pt.'s eye glasses were not available  MOBILITY STATUS:  Power w/c  POSTURE COMMENTS:  Pt. Requires position changes in his power w/c  ACTIVITY TOLERANCE: Activity tolerance:  Fair  FUNCTIONAL OUTCOME MEASURES: FOTO: 40  TR score: 45  05/25/2022:   FOTO: 50 TR score: 45  07/20/22: FOTO 55  03/08/23: 51  UPPER EXTREMITY ROM     Active ROM Left eval Left  03/21/2022 Left 05/25/2022 L  07/20/22 Left 07/25/2022 Left  08/15/2022 Left  09/26/2022 Left  11/16/2022 Left 12/28/2022 Left 03/13/23 Left 05/10/23  Shoulder flexion 119 123 125 130  136 138 130 142 144 144  Shoulder abduction 110 115 115 115  118 121 125 142 140 140  Shoulder adduction             Shoulder extension             Shoulder internal rotation             Shoulder external rotation             Elbow flexion 135(135) 145 145 145  145    145 145  Elbow extension -27(-18) -21(-20) -20(-16)  -25(-10) -23(-10) -21(-10) -20(-18) -20(-18) -20(-17) -17(-16)  Wrist flexion             Wrist extension 10(50) 19(50) 28(50)  30(50) 35(55) 36(55) 36(60) 40(60) 45(60) 45(60)  Wrist ulnar deviation             Wrist radial deviation             Wrist pronation             Wrist supination   Limited by flexor tone       10(15)   (Blank rows = not tested)    Active ROM Right eval Right 03/21/2022 Right 05/25/2022 R  07/20/22 Right  07/25/22 Right 08/15/2022 Right 09/26/2022 Right 11/16/2022 Right 12/28/2022 Right 03/13/23 Right 05/10/23  Shoulder flexion 106 scaption 105  Scaption 62 flexion 110 scaption 100  105 105 105 112 Scaption 123 Scaption 122  Scaption  Shoulder abduction 114 90 97 100  105 117 120 124 124 122  Shoulder adduction             Shoulder extension             Shoulder internal rotation             Shoulder external rotation             Elbow flexion 120(130) 145 145 140  144 140 142 148 148 145  Elbow extension -45(-35) -22(-35)  -28(-22)  -32(-21) -32(-28) -28(-26) -28(-28) -27(-26) -27(-40) -38(-35)  Wrist flexion             Wrist extension -30(10) -25(10) -10(30)  -10(20) -10(25) -20(40) -30(10) -22(34) -22(35) -  32(22)  Wrist ulnar deviation             Wrist radial deviation             Wrist pronation             Wrist supination   Limited by flexor tone            12/28/2022: Thumb radial abduction: Right: 12(46), Left: 40(54)  Digits: Bilateral 2nd through 5th digit: MP Hyperextension with PIP, an DIP flexion     UPPER EXTREMITY MMT:     Left eval Left 03/21/2022 Left  05/25/2022 L 07/20/22 Left  08/15/2022 Left 09/26/2022 Left 11/16/2022 Left 12/28/2022 Left  03/13/2023 Left  05/10/23  Shoulder flexion 3-/5 3/5 3+/5 4+/5 4+/5 4+/5 4+/5 4+/5 4+/5 4+/5  Shoulder abduction 3-/5 3-/5 4-/5 4+/5 4+/5 4+5 4+/5 4+/5 4+/5 4+/5  Shoulder adduction            Shoulder extension            Shoulder internal rotation            Shoulder external rotation            Elbow flexion 3/5 3+/5 4-/5 4+/5 5/5 5/5 5/5 5/5 5/5 5/5  Elbow extension 2-/5 2/5  4+/5 4+/5 4+/5  4/5 4/5 4/5 through available range  Wrist flexion            Wrist extension 2/5 2+/5 3-/5  3-/5 3-/5 3-/5 3/5 3/5 3/5  Wrist ulnar deviation            Wrist radial deviation            Wrist pronation            Wrist supination            (Blank rows = not tested)  Right eval Right 03/21/2022 Right  05/25/2022 R 07/20/22 Right 08/15/2022 Right 09/26/2022 Right 11/16/2022 Right 12/28/2022 Right 03/13/2023 Right 05/10/23  Shoulder flexion 3-/5 scaption 3-/5 3-/5 3+/5 3+/5 3+/5 3/5 3/5 3/5 3+/5  Shoulder abduction 3-/5 3-/5 3-/5 4/5 4/5 4/5 4/5 4/5 4/5 4/-5  Shoulder adduction            Shoulder extension            Shoulder internal rotation            Shoulder external rotation            Elbow flexion 3/5 3/5 3+/5 4/5 4+/5 4+/5 4+/5 4+/5 4+/5 4+/5  Elbow extension 2-/5 2/5 3-/5 4+/5 4+/5 4+/5 4+/5 4-/5 4-/5 4-/5  Wrist flexion             Wrist extension 2-/5 2-/5 2/5 2/5 2/5 2/5 2/5 2/5 2/5 2/5  Wrist ulnar deviation            Wrist radial deviation            Wrist pronation            Wrist supination              HAND FUNCTION:  Grip strength: Right: 0 lbs; Left: 0 lbs and Lateral pinch: Right: 5 lbs, Left: 2 lbs  03/21/2022: Lateral pinch: Right: 3.5 lbs, Left: 2 lbs  07/20/22: Grip strength: Right: 3 lbs; Left: 4 lbs and Lateral pinch: Right: 5 lbs, Left: 3 lbs  08/15/2022:  Grip strength: Right: 0 lbs; Left: 5 lbs and Lateral pinch: Right: 2 lbs, Left: 4 lbs  09/26/2022  Grip strength: Right: 0 lbs; Left: 8 lbs and Lateral pinch: Right: 2 lbs, Left: 5 lbs  11/16/2022  Grip strength: Right: 0 lbs; Left: 8 lbs  9/25/20204  Grip strength: Right: 0 lbs; Left: 8 lbs  Grip strength: Right: 0 lbs; Left: 8 lbs   03/08/23:  Grip strength: Right: 0 lbs without PROM, 5 lbs after PROM, Left 8 lbs  05/10/23:  Grip strength: TBD  Bilateral digit PIP/DIP flexion contractures with MP hyperextension with attempts for AROM. Pt. is able to tolerate AROM to the bilateral digits at the initial evaluation however, has a history of pain in the digits.  COORDINATION: Eval: Pt. is unable to grasp 9-hole test pegs. Pt. is able to initiate grasping larger pegs, and is able to hold a pen in the left hand.  07/20/22: 2 min 36 seconds to remove 9 pegs from 9 hole peg test - cues to locate pegs 2/2 low vision. Pt. is able to initiate grasping larger pegs on R hand and is able to hold a pen in the left hand.  08/15/2022:    Vision, and sensation limiting accuracy of 9 hole peg test results. Pt. Was able to grasp and remove vertical pegs intermittently with cues.    05/10/23: TBD  SENSATION: Light touch: Impaired   EDEMA:  N/A  MUSCLE TONE: BUE flexor Spasticity  COGNITION Overall cognitive status: Continue to assess in functional context  VISION:   Subjective report: Pt. was not wearing glasses at the time of the  initial eval.  Baseline vision: Vision is very limited. Wears glasses all the time Visual history: History of impaired vision following CVA. Pt. Has received treatment through the [facility] low vision rehabilitation program.   VISION ASSESSMENT: Impaired To be further assessed in functional context  PERCEPTION: Impaired   PRAXIS: Impaired: motor planning  OBSERVATIONS:  Pt reports being on Tramadol   TODAY'S TREATMENT    Therapeutic Ex.:  Emphasis was placed on slow prolonged gentle passive stretching for RUE, forearm supination, bilateral wrist extension, wrist extension, digit MP, PIP, and DIP extension following moist heat, and left wrist flexion/extension, PIP/DIP flexion, and extension while heat was applied to the RUE. PROM was performed to decrease stiffness, and prepare the RUE for functional use. ROM was performed to the shoulder right elbow, forearm, wrist, and digits. PROM for bilateral digit MP, PIP, and DIP extension in preparation for placing them onto a flat surface    PATIENT EDUCATION: Education details: stretching, PROM, massage to the digits on the bilateral hands. Person educated: Patient and Parent Education method: Explanation Education comprehension: verbalized understanding  HOME EXERCISE PROGRAM:  Continue ongoing assessment, and continue to provide as needed.   GOALS: Goals reviewed with patient? Yes  SHORT TERM GOALS: Target date: 11/07/2022   To assess splint fit, and make appropriate adjustments to promote good skin integrity through the palmar surface of the bilateral hands.  Baseline: 05/25/22: Goal currently met, however ongoing as needs to assess splint fit arise. 03/23/2022: Pt. is wearing splints a couple of hours at night bilateral resting hand splints. 03/21/2022: Pt. is wearing splints a couple of hours at night bilateral resting hand splints. Goal status: Deferred   LONG TERM GOALS: Target date: 08/02/2023    FOTO score will Improve by 2 points  for Pt. perceived improvement with the assessment specific ADL/IADL tasks.  Baseline:  03/08/23: 51; 12/28/2022: FOTO score: 56; 11/16/2022: 52 09/27/2022: 51 07/20/2022: FOTO 55 05/25/2022: FOTO score: 50,  TR score:  45 Eval: FOTO score: 40,  TR score: 45 Goal status: Achieved   2.   Pt. will independently perform oral care for 100% of the task after complete set-up. Baseline: 05/10/23: Pt. Performs 90% of the task. Pt.'s mother assists with 10% of the task for thorough cleaning the hard to reach places way in the back of the oral cavity.03/08/23: not as much assist required proximally, but to adjust positioning of toothbrush; 02/13/23: Pt. Continues to require assist proximally 01/09/2023: Continue 12/28/2022:  Pt. Continues to complete 90% of the task with complete set-up, with visual cues, and occassional assist of the right elbow. 11/16/2022: Pt. Completes 90% of the task with complete set-up. 09/26/2022: Completes 90% of the task, proximal assist at times required at the elbow.  08/15/2022: Completes 90% of the task, proximal assist at times required at the elbow. 07/20/22: completes 90% of task, limited by shoulder flexion. 05/25/2022:  Pt. Is able to initiate and perform oral care for approximately 90% of the task. Complete set-up required. Assi needed only for the very back teeth. 03/23/2022: Pt. Is able to initiate and perform oral care for approximately 75% of the task. Pt. Requires assist at proximally at the elbow for through oral care. 03/21/2022: Pt. Is able to initiate and perform oral care for approximately 75% of the task. Pt. Requires assist at proximally at the elbow for through oral care. Eval: Pt. is able to initiate using an electric toothbrush. Pt. requires assist for set-up, and assist for thoroughness, and as he Pt. fatigues. Goal status:  Partially met, Goal discontinued  3.  Pt. Will be modified independence with self-feeding for 100% using a swivel spoon, and standard fork after complete  set-up Baseline: 05/10/2023: Pt. feeds self 95-100% of the meal. with occasional assist to reposition the utensil. 03/08/23: indep with cup with a handle and lid, assist to reposition eating utensils in hand;  02/13/23:  Pt. Is now able to consistently, and efficiently use a swivel spoon for eating cereal with the left hand.01/09/2023: Continue 12/28/2022: Pt. Is now feeding himself 90% of the time using a swivel spoon with the left hand, and 95% of the time with the fork. Pt. Continues to have difficulty with cutting food. 11/16/2022: 100% with finger foods, Pt. Is initiating with a swivel spoon, and universal cuff for fork use, however requires assist for consistency, and accuracy.  09/26/2022: 90% using the spoon, assist at time with the forearm motion, and grip on the utensils. 100% for finger foods.  08/15/2022:  90% using the spoon, assit at time with the forearm motion, and grip on the utensils. 100% for finger foods-independent with set-up- unsupervised.  07/20/22: self-feeds cereal using spoon 90% of task. 05/25/2022: Pt. Is able to use a spoon to scoop cereal when feeding himself cereal 85% of the time. Pt. Is able to feed himself snack/finger foods 100% of the time. Pt. Continues to work on consistency of  stabilizing a cup/mug when drinking. Pt. Is able to grasp a water bottle with assist initially, with assist tapering off as he drinks.03/23/2022: Pt. Is able to perform scooping cereal for 75% of the time. Pt. required assist, and support at the left elbow, and Pt. Presents with limited forearm supination when using the spoon, and bringing it towards his mouth. Pt. Is able to use a fork to spear items, and perform the hand to mouth pattern.  03/21/2022: Pt. Is able to perform scooping cereal for 75% of the time. Pt. required assist, and support  at the left elbow, and Pt. presents with limited forearm supination when using the spoon, and bringing it towards his mouth. Pt. Is able to use a fork to spear items,  and perform the hand to mouth pattern.  Eval: Pt. is able to hold standard standard utensils. Pt. Performs as much of the task as he, can and has assistance for the remainder. Goal status:  Improved, Achieved  4.  Pt. will improve grasp patterns and consistently grasp 1/4" objects for ADL, and IADL tasks.  Baseline: 05/31/2023: Continue 05/10/23: Pt. is consistently able to grasp 1/2" objects with visual cues. Pt. Requires assist, and increased visual cues to grasp 1/4" objects on an elevated plane.  Pt. Is unable to grasp the flat at the tabletop surface. Pt. Is grasping grasping small puppy vitamins.03/08/23: 1/2" objects efficiently; 02/13/2023: 1/2" objects efficiently 01/09/2023: Continue 12/28/2022: Continues to grasp 1/4" pegs with min a + visual cues, consistently grasping 1/2" objects with visual cues 11/16/2022: Continues to grasp 1/4" pegs with min a + visual cues, consistently grasping 1/2" objects with visual cues 09/26/2022: Continues to grasp 1/4" pegs with min a + visual cues, consistently grasping 1/2" objects with visual cues. 08/15/2022: grasps 1/4" pegs with min a + visual cues, consistently grasping 1/2" objects with visual cues. 07/20/22: grasps 1/4" pegs with min a + visual cues, consistently grasping 1/2" objects with visual cues. 05/25/2022: Pt. Is working on improving consistency of grasping 1/2" objects with visual cues.  03/23/2022: Pt. Is able to grasp 1" objects consistently,and continues to work on the hand patterns needed to grasp 1/2" objects.03/21/2022: Pt. Is able to grasp 1" objects consistently,and continues to work on the hand patterns needed to grasp 1/2" objects. Eval: Pt. is able to grasp 1" objects intermittently using a lateral grasp pattern. Goal status: Ongoing  5.  Pt. will independently write his name legibly with letter sizes under 1". Baseline: 05/10/23: Deferred 03/08/23: Not addressed this period; 02/13/2023: Continue 01/09/2023: Continue. 12/28/2022: Pt. is now using  an IPad Pen. 11/16/2022: Pt. Continues to be able to write name with smaller letter size. Pt. Is signing name with a computer styus. Pt. Requires visual cues, and assist 09/26/2022: Pt. Continues to be able to write name with smaller letter size. Pt. Is signing name with a computer styus. Pt. Requires visual cues, and assist. 08/15/2022: Pt. Is able to write name with smaller letter size. Pt. Is signing name with a computer styus. Pt. Requires visual cues, and assist. 07/20/22: stabilizing assist to write name with 75% legibility with 2" letters. 05/25/2022: Pt. to continue to work towards formulating grasp patterns in preparation for grasping a large width pen. Pt. Requires visual cues. 03/23/2022: Pt. Is able to write his name with modA, however has difficulty with formulating letter sizes less than 2" in size with 50% legibility for the 3 letters of his name.03/21/2022: Pt. Is able to write his name with modA, however has difficulty with formulating letter sizes less than 2" in size with 50% legibility for the 3 letters of his name. Eval: Pt is able to hold a thin marker with his left hand, and formulate a line, and initiate a circular pattern (Pt. without glasses today) Goal status: Deferred  6. Pt. Will reach up to comb/brush his hair with minA.  Baseline: 05/10/23: Pt. is able to use a pick independently with the left hand with occasional assist to the far side of the left his head. 03/08/23: Pt uses pick in L hand to reach  75% of his head; unable to reach R side of head; 02/13/2023:Pt. is able to use a hair pick with the left hand on the right side of his head, top, and back of the head. Pt. Continues to have difficulty reaching further to the right side of his head with the left. 01/09/2023: Continue 12/28/2022: Pt. Is progressing, and is now able to use a hair pick with the left hand on the right side of his head, top, and back of the head. Pt. Continues to have difficulty reaching further to the right side of  his head with the left. 11/16/2022: Pt. Is able to use a hair pick for the left side of his head using his left hand. Pt. Presents with difficulty reaching to the right side of his head.09/26/2022: Pt. Is able to use a hair pick for the left side of his head using his left hand. Pt. Presents with difficulty reaching to the right side of his head. 5/13/202: 75% using a hair pick, assist to the right side of the head. 07/20/22: reaches 75% of head, assist for far R side of head, fatigues quickly. 05/25/2022: Pt. Is able to reach up with the left hand to the left side, top, and back of his head. 03/23/2022: Pt. is now able to more consistently initiate reaching up to his head with his left hand in preparation for haircare12/18/2023: Pt. is now able to more consistently initiate reaching up to his head with his left hand in preparation for haircare. Eval: Pt. is able to initiate reaching up for hair care with a long handled brush, however is unable to sustain UE's in elevation to perform the task.     Goal status: Achieved  7. Pt. Will independently navigate the w/c through his environment with minA with visual scanning, and hand placement on the controls.  Baseline: 11/16/2022: Deferred 2/2 vision 09/26/2022: Pt. Is now navigating his w/c ouside the home, and around his block. 08/15/2022: Pt. Requires minA to setup hand on controls and MIN cues to navigate the w/c in wide spaces, requires MIN - MOD cues to navigate the w/c through more narrow doorways, and tighter turns - varies based on good vs bad vision days.07/20/22: Pt. Requires minA to setup hand on controls and MIN cues to navigate the w/c in wide spaces, requires MIN - MOD cues to navigate the w/c through more narrow doorways, and tighter turns - varies based on good vs bad vision days. 05/25/2022: Pt. Requires minA  and  cues to navigate the w/c in wide spaces, and requires Mod cues to navigate the w/c through more narrow doorways, and tighter turns. Pt. Requires  max cues for scanning through the environment, and moderate cues for hand placement on the controls.     Goal status: Deferred 2/2 vision    8. Pt. Will improve bilateral grip strength to be able to independently grasp, and pull up blankets, and linens.   Baseline: 05/10/23: Deferred 03/08/23: R grip 0-5 lbs, L grip 8 lbs; pt reports he can consistently pull up blankets and linens independently.  02/13/2023: Continue. Pt. presents with digit PIP, and DIP flexor tightness. 01/09/2023: Continue 12/28/2023: R: 0#, L: 5# Pt. Is now able to more consistently grasp and pull blankets up over him. 11/16/2022: R: 0 L: 8# Pt. Is more consistently grasping his blankets and attempting to pull them up. 09/26/2022: R: 0  L: 8# Pt. is attempting to grasp, and pull blankets up more, using mostly the left hand. 08/15/2022:  Pt. has difficulty securely holding, and pulling up blankets, and linens.    Goal status:Deferred  9. Pt. will consistently actively control the releasing of blankets, covers, and linens from his hands once they are in the desired position over him. Baseline:12/28/2022: Pt. Is consistently actively able to release linens once they are in the desired position over him. 11/16/2022: Pt. continues to improve with actively releasing blankets form his hands. 09/26/2022: Pt. is improving with actively releasing blankets form his hands. 08/15/2022: Pt. has difficulty with controlled releasing of blankets/linens from his grasp.     Goal status: Achieved    10. Pt. Will improve bilateral UE strength by 2 MM grades to assist with ADLs, and IADLs  Baseline: 05/31/2023: Continue 05/10/23: Right: shoulder flexion: 3+/5, abduction: 44-/5, elbow flexion: 4+/5 , extension: 4-/5 wrist extension: 2/5; Left: shoulder flexion: 4+/5, abduction: 4+/5, elbow flexion: 5/5 , extension: 4/5  wrist extension: 3/5 03/13/2023: Right: shoulder flexion: 3/5, abduction: 4/5, elbow flexion: 4+/5 , extension: 4-/5 wrist extension: 2/5; Left:  shoulder flexion: 4+/5, abduction: 4+/5, elbow flexion: 5/5 , extension: 4/5  wrist extension: 3/5 03/08/23: NT this date d/t as pt arrived late; 02/13/2023: Continue 01/09/2023: Continue 12/28/2022: Right: shoulder flexion: 3/5, abduction: 4/5, elbow flexion: 4+/5 , extension: 4-/5 wrist extension: 2/5; Left: shoulder flexion: 4+/5, abduction: 4+/5, elbow flexion: 5/5 , extension: 4/5  wrist extension: 3/5 Right: shoulder flexion: 3+/5, abduction: 4/5, elbow flexion: 4+/5 , extension: 4+/5 wrist extension: 2/5; Left: shoulder flexion: 4+/5, abduction: 4+/5, elbow flexion: 5/5 , extension: 4+/5 wrist extension: 3-/5 11/16/2022: Right: shoulder flexion: 3/5, abduction: 4/5, elbow flexion: 4+/5 , extension: 4+/5 wrist extension: 2/5; Left: shoulder flexion: 4+/5, abduction: 4+/5, elbow flexion: 5/5 , extension:  wrist extension: 3-/5 Right: shoulder flexion: 3+/5, abduction: 4/5, elbow flexion: 4+/5 , extension: 4+/5 wrist extension: 2/5; Left: shoulder flexion: 4+/5, abduction: 4+/5, elbow flexion: 5/5 , extension: 4+/5 wrist extension: 3-/5 Goal status: Ongoing  11. Pt. will improve right wrist extension by 10 degrees to initiate reaching for items in preparation for ADLs. Baseline: 05/31/23: Continue 05/10/23: Right wrist extension: -32(22) 03/13/23: -22(35) 03/08/23: NT this date as pt arrived late; 02/13/2023: Continue 01/09/2023: Continue 12/28/2022: -22(34) 11/16/2022: -30(10)    Goal status: Ongoing      12. Pt. Will require donn/doff a Tank top T-shirt efficiently with supervision and full set-up.    Baseline: 05/31/2023: Pt. Requires maxA donning a pullover sweatshirt, Mod-maxA doffing it. T-shirt tank TBD 05/10/23: Pt. Presents with difficulty completing, and requires increased time to complete    Goal status: New    13. Pt. Will will independently perform bilateral/bimanual hand tasks needed to fold towels/linens/laundry Baseline:05/31/23: Continue 05/10/23:  Pt. Has difficulty folding  towels/lines/laundry using bilateral hands Goal status: New  14. Pt. Will independently, and efficiently reach his right arm over the side of the chair to pet his dog.    Baseline: 05/31/2023: Continue 05/10/23: Pt. Is unable to efficiently reach his right UE over the side of his recliner chair to pet his dog.    Goal status: New    15. Pt. Will independently, and efficiently reach out in front of him to efficiently place items onto his table while sitting in his recliner.     Baseline: 2/26/225: Continue 05/10/23: Pt. has difficulty reaching out in front of him far enough to efficiently place items on the table while sitting in a recliner chair.    Goal status: New       ASSESSMENT:  CLINICAL IMPRESSION:  Pt. requested to work on stretching RUE, and bilateral wrist, and digits today 2/2 increased flexor tone, and tightness. Pt. is several months overdue for Botox injections 2/2 being rescheduled due to inclement weather. Pt. has an appointment for Botox scheduled on Wednesday this week. Plan to resume working towards the new above goals following Botox. Pt. continues to be highly motivated to continue working towards improving BUE functioning, ROM, strength, motor control, [facility] skills, as well as visual compensatory strategies in order to maximize independence with daily ADLs, and IADL functioning.      PERFORMANCE DEFICITS in functional skills including ADLs, IADLs, coordination, dexterity, proprioception, ROM, strength, pain, FMC, GMC, decreased knowledge of use of DME, and UE functional use, cognitive skills including safety awareness, and psychosocial skills including coping strategies, environmental adaptation, habits, and routines and behaviors.   IMPAIRMENTS are limiting patient from ADLs, IADLs, leisure, and social participation.   COMORBIDITIES may have co-morbidities  that affects occupational performance. Patient will benefit from skilled OT to address above impairments and improve  overall function.  MODIFICATION OR ASSISTANCE TO COMPLETE EVALUATION: Maximum or significant modification of tasks or assist is necessary to complete an evaluation.  OT OCCUPATIONAL PROFILE AND HISTORY: Comprehensive assessment: Review of records and extensive additional review of physical, cognitive, psychosocial history related to current functional performance.  CLINICAL DECISION MAKING: High - multiple treatment options, significant modification of task necessary  REHAB POTENTIAL: Fair    EVALUATION COMPLEXITY: High    PLAN: OT FREQUENCY: 2 x's a week  OT DURATION:12 weeks  PLANNED INTERVENTIONS: self care/ADL training, therapeutic exercise, therapeutic activity, neuromuscular re-education, manual therapy, passive range of motion, patient/family education, and cognitive remediation/compensation RECOMMENDED OTHER SERVICES: PT  CONSULTED AND AGREED WITH PLAN OF CARE: Patient and family member/caregiver  PLAN FOR NEXT SESSION: Review HEP  Olegario Messier, MS, OTR/L  06/26/2023

## 2023-06-26 NOTE — Therapy (Signed)
 OUTPATIENT PHYSICAL THERAPY TREATMENT     Patient Name: Paul Hall MRN: [id number] DOB:03-12-1996, 28 y.o., male Today's Date: 06/27/2023  PCP:  Cindi Carbon PROVIDER:  Lenise Herald, PA-C   PT End of Session - 06/26/23 1717     Visit Number 152    Number of Visits 160   date corrected from most recent recert   Date for PT Re-Evaluation 08/23/23    Authorization Type [employer]/ [place] [employer]    Authorization Time Period 01/04/21-03/29/21; Recert 03/24/2021-06/16/2021; Recert 09/15/2021- 12/08/2021; Recert 12/13/2021-03/07/2022    Progress Note Due on Visit 160    PT Start Time 1616    PT Stop Time 1701    PT Time Calculation (min) 45 min    Equipment Utilized During Treatment Gait belt    Activity Tolerance Patient tolerated treatment well    Behavior During Therapy WFL for tasks assessed/performed                            Past Medical History:  Diagnosis Date   Diabetes mellitus (HCC)    Hypertension    Stroke (HCC)    Past Surgical History:  Procedure Laterality Date   IVC FILTER PLACEMENT ([facility] HX)     LEG SURGERY     PEG TUBE PLACEMENT     TRACHEOSTOMY     Patient Active Problem List   Diagnosis Date Noted   Sepsis due to vancomycin resistant Enterococcus species (HCC) 06/06/2019   SIRS (systemic inflammatory response syndrome) (HCC) 06/05/2019   Acute lower UTI 06/05/2019   VRE (vancomycin-resistant Enterococci) infection 06/05/2019   Anemia 06/05/2019   Skin ulcer of sacrum with necrosis of muscle (HCC)    Urinary retention    Type 2 diabetes mellitus without complication, with long-term current use of insulin (HCC)    Tachycardia    Lower extremity edema    Acute metabolic encephalopathy    Obstructive sleep apnea    Morbid obesity with BMI of 60.0-69.9, adult (HCC)    Goals of care, counseling/discussion    Palliative care encounter    Sepsis (HCC) 04/27/2019   H/O insulin dependent diabetes mellitus 04/27/2019   History of  CVA with residual deficit 04/27/2019   Seizure disorder (HCC) 04/27/2019   Decubitus ulcer of sacral region, stage 4 (HCC) 04/27/2019    REFERRING DIAG: Cerebral infarction, unspecified   THERAPY DIAG:  Muscle weakness (generalized)  Other lack of coordination  Difficulty in walking, not elsewhere classified  Abnormality of gait and mobility  Unsteadiness on feet  Other abnormalities of gait and mobility  History of CVA with residual deficit  Rationale for Evaluation and Treatment Rehabilitation  PERTINENT HISTORY: Paul Hall is a 26yoM who presents with severe weakness, quadriparesis, altered sensorium, and visual impairment s/p critical illness and prolonged hospitalization. Pt hospitalized in October 2020 with ARDS 2/2 COVID19 infection. Pt sustained a complex and lengthy hospitalization which included tracheostomy, prolonged sedation, ECMO. In this period pt sustained CVA and SDH. Pt has now been liberated from tracheostomy and G-tube. Pt has since been hospitalized for wound infection and UTI. Pt lives with parents at home, has hospital bed and left chair, hoyer lift transfers, and power WC for mobility needs. Pt needs heavy physical assistance with ADL 2/2 BUE contractures and motor dysfunction   PRECAUTIONS: Fall  SUBJECTIVE:  Patient reports feeling tight- going to have Botox tomorrow.  PAIN:  Are you having pain? No. But feeling tight.  TODAY'S TREATMENT:  - 06/26/2023     *Patient dependently hoyer transfer from his power chair to edge of mat with +2 assist (mom +PT) and later same assist for transfer back to w/c from mat (using patient provided hoyer pad) and then at end of visit transport via hoyer back from EOM back to power w/c  THERAPEUTIC ACTIVITIES:  - EOM- Static sitting for a couple of min as Patient was on different mat table and needed a few min to get acclimated.    -Dynamic weight shifting placing  forearm support  through PT arm- 15 rep each  side.  - anterior weight shifting at EOM x 15 reps Hand over hand technique with PT.  - Resistive lumbar ext into lumbar flex using BTB x 20 reps  -Seated Toe taps x 20 reps BLE -Seated heel lifts x 20 reps BLE -Seated knee ext 2 x 10 reps ea LE -Seated hip abd/add at EOM x 15 reps each LE - Static sit with attempting to extend arms to reach for EOM- (patient unable on right due to increased tone)                PATIENT EDUCATION: Education details: Exercise technique with VC   HOME EXERCISE PROGRAM:  Access Code: [email or web address] URL: [email or web address] Date: 03/23/2022 Prepared by: Maureen Ralphs  Exercises - Supine Bridge  - 3 x weekly - 3 sets - 10 reps - 2 hold - Supine Gluteal Sets  - 3 x weekly - 3 sets - 10 reps - 5 sec hold - Supine Quad Set  - 1 x daily - 3 x weekly - 3 sets - 10 reps - 5 hold - Seated Long Arc Quad  - 1 x daily - 7 x weekly - 3 sets - 10 reps - Seated Hip Adduction Squeeze with Ball  - 1 x daily - 3 x weekly - 3 sets - 10 reps - 5 hold - Seated Hip Abduction  - 1 x daily - 3 x weekly - 3 sets - 10 reps - 2 hold    PT Short Term Goals -       PT SHORT TERM GOAL #1   Title Pt will be independent with HEP in order to improve strength and balance in order to decrease fall risk and improve function at home and work.    Baseline 01/04/2021= No formal HEP in place; 12/12 no HEP in place; 05/10/2021-Patient and his father were able to report compliance with curent HEP consisting of mostly seated/reclined LE strengthening. Both verbalize no questions at this time.    Time 6    Period Weeks    Status Achieved    Target Date 02/15/21            PT LONG TERM GOAL #1     Title Patient will increase BLE gross strength by 1/2 muscle grade to improve functional strength for improved independence with potential gait, increased standing tolerance and increased ADL ability.     Baseline 01/04/2021- Patient presents with 1/5 to 3-/5 B LE  strength with MMT; 12/12: goal partially met for Left knee/hip; 05/10/2021= 2-/5 bilateal Hip flex; 3+/5 bilateral Knee ext; 06/21/2021= Patient presents with 2-/5 bilateral Hip flex; 3+/5 bilateral knee ext/flex; 2-/5 left ankle DF; 0/5 right ankle- and able to increase reps and resistance with LE's. 09/15/2021- Patient technically presents with 2-/5 B hip flex/abd/add - but he is able to raise his hip up to approx 100 deg which has improved.  3+/5 Bilateral knee ext, 2-/5 left ankle and 0/5 right ankle.  12/08/2021= Patient able to lift left knee at 110 deg of hip flex; presents with 3+/5 knee ext, 2-/5 left ankle DF and 0/5 right ankle DF, 2-/5 bilateral Hip abd in seated position.    12/6: R: knee 3+/5 ext, 2/5 flexion, left knee 3+/5 extension, 3+/5 flexion, R hip: 2+/5 hip add, 2+/5 hip ABD L hip: 4-/5 hip ABD, 3+/5 hip ADD, 3+/5 hip flexion; 06/06/2022= Patient now presents with 2-/5 right ankle DF/PF;     Time 12     Period Weeks     Status MET    Target Date 03/07/2022         PT LONG TERM GOAL #2    Title Patient will tolerate sitting unsupported demonstrating erect sitting posture for 15 minutes with CGA to demonstrate improved back extensor strength and improved sitting tolerance.     Baseline 01/04/2021- Patient confied to sitting in lift chair or electric power chair with back support and unable to sit upright without physical assistance; 12/12: tolerates <1 minutes upright unsupported sitting. 05/10/2021=static sit with forward trunk lean  in his power wheelchair without back support x approx 3 min. 06/21/2021=Unable to assess today due to patient with acute back pain but on previous visit able to sit x 8 min without back support. 09/15/2021- on last visit- 09/13/2021- patient was able to sit unsupported x 8 min at edge of mat. 10/13/2023 - Patient was able to sit at edge of mat with varying level of assist today from SBA to min A for a total of 20 min. 12/13/2021= Patient demonstrated unsupported sitting at  edge of mat for approx 20 min    Time 12     Period Weeks     Status GOAL MET    Target Date 12/08/21          PT LONG TERM GOAL #3    Title Patient will demonstrate ability to perform static standing in // bars > 2 min with Max Assist  without loss of balance and fair posture for improved overall strength for pre-gait and transfer activities.     Baseline 01/04/2021= Patient current uanble to stand- Dependent on hoyer or sit to stand lift for transfers. 05/10/2021=Not appropriate yet- Currently still dependent with all transfers using hoyer. 06/21/2021= Patient continuing now to focus on LE strengthening to prepare for standing-unable to try today due to acute low back pain-  planning on attempting in new cert period. 09/15/2021- Patient has attempted standing 2x in past two week- max Assist of 2 people - only once was he successful to clearing his bottom from chair - Will continue to be a focus during the new certification. 12/13/2021= Patient has been limited secondary to increased overall low back pain during this certification and will require more time to focus on this goal.  12/6: not assessed this date, will assess at date when 2-3 PTs are present for assistance     Time 12     Period Weeks     Status GOAL not appropriate at this time - may attempt in future once Patient presents with improved overall LE strength.                 PT LONG TERM GOAL #4    Title Pt will improve FOTO score by 10 points or more demonstrating improved perceived functional ability     Baseline FOTO 7 on 10/17; 03/15/21: FOTO 12; 05/10/2021 06/21/2021=  1; 09/15/2021= 9; 12/13/2021= Will issue next visit 12/6: 4; 06/06/2022= Will assess next visit; Goal not appropriate as patient is not ambulatory.     Time 12     Period Weeks     Status WITHDRAWN    Target date 11/30/2022         PT LONG TERM GOAL #5    Title Patient will perform sit to stand transfer with appropriate AD and max assist of 2 people with 75% consistency to  prepare for pregait activities.     Baseline 09/15/2021= Patient unable to stand well- unable to clear his bottom off chair with Max assist of 2 persons. 12/13/2021- Goal not appropriate to try yet but will keep and roll over to next cert as shift continues to focus on transfers/standing; 4/24= Patient able to perform active ankle DF/PF with right LE and able to raise his knee into seated march and clear floor without physical assist today - as previously unable as well as lift right knee ext to near full ROM to improve strength for eventual standing. 09/07/2022- Goal continues to not be appropriate but patient is now standing some and will keep goal active; 11/23/2022= patient is now performing sit to stand with max A +2 and now able to clear his bottom off mat. 11/30/2022- Patient continues to perform sit to stand transfers with Max A+2 and able to clear bottom well off mat but not erect yet. 05/31/2023- Patient is able to stand consistently with max A +2 - unable to achieve full knee or trunk ext yet is able to clear bottom off mat for improved LE weightbearing.     Time 12     Period Weeks     Status PROGRESSING    Target date  08/23/2023       PT LONG TERM GOAL #6  Title Patient will tolerate sitting unsupported demonstrating erect sitting posture for 30 minutes with CGA to demonstrate improved back extensor strength and improved sitting tolerance.   Baseline 12/13/2021= Patient demonstrated unsupported sitting at edge of mat for approx 20 min;  12/29/2021- Patient performed approx 30 min of dynamic sitting activities today. 06/06/2022= Patient demonstrated ability to sit and perform static and dynamic UE/LE movement with only Supervision.   Time 12   Period Weeks   Status GOAL MET           7.  Patient will tolerate 2 minutes or more of standing in sit to stand lift or with max assist +2 in order to indicate improved lower extremity weightbearing tolerance for progression to standing in parallel  bars. Baseline: 1 minute on most recent stand 02/21/22; 06/06/2022- Patient did attempt today after complaining of right LE pain last week. He attempted 3 stands using sit to stand lift. - all over 1 min- last one approx 48 sec- stopped due to fatigue. More erect standing in lift today - still poor gluteal strength but able to activate glutes and extend hips upon command but unable to hold > 5 sec. 07/28/2022- Will attempt standing next visit but patient has been able to stand since last progress note for up to 3 min at a time at his best. 09/07/2022-  attempted standing with max +2 and patient able to stand 15-20 sec so will now  revise goal; 11/23/2022- Patient at his best between last 2 visits was able to stand approx 50 sec with max A+2.  11/30/2022- Patient performed 4 trials of static standing - max A +2- clearing  bottom and using forearms to bear weight  03/15/2023: Patient performed 2 trials of static standing - max A +2- clearing edge of mat and using forearms to bear weight for approximately 45 seconds, is able to come into approximately 45 degrees of knee flexion from sit to stand; 05/31/2023- Patient has not attempted standing in 1 month but able to perform x 4 trials with max A +2 (PT and his mom) and able to clear his bottom off mat and attempted to activate his glutes and apply some forearm pressure - able to stand between 25-40 sec today.  Goal status: PROGRESSING Target date: 08/23/2023   8. Patient will tolerate sitting unsupported demonstrating ability to perform dynamic UE/LE activities for 30 minutes  independently to demonstrate ability to sit at edge of bed to eat or perform some ADL's/exercise for optimal quality of life.             Baseline: 06/06/2022- Patient able to sit and perform some UE/LE exercises but requires CGA at times for safety- he is able to static sit for 30 min with supervision. 07/27/2022= Patient able to now sit > 30 min with Supervision only - performing static and  dynamic activities. Will keep goal active to ensure patient is consistent. 09/07/2022=  Patient able to demonstrate consistent ability to sit at edge of mat during visits             Goal Status: MET             Target date: 08/29/2022        Plan     Clinical Impression Statement Treatment continues to focus on dynamic activities incorporating trunk support and dynamic weight shifting. He performed well overall- able ot complete several prescribed task engaging core/LE/and UE yet became more guarded during session. Had discussion about his anxiety and formed a plan to try to inform him of what the possible plan for PT may be prior to visit to calm him. He verbalized this may help relax him if he kind of know what to expect from visit to visit.  Pt will continue to benefit from skilled physical therapy intervention to address impairments, improve QOL, and attain therapy goals.    Personal Factors and Comorbidities Comorbidity 3+;Time since onset of injury/illness/exacerbation    Comorbidities CVA, diabetes, Seizures    Examination-Activity Limitations Bathing;Bed Mobility;Bend;Caring for Others;Carry;Dressing;Hygiene/Grooming;Lift;Locomotion Level;Reach Overhead;Self Feeding;Sit;Squat;Stairs;Stand;Transfers;Toileting    Examination-Participation Restrictions Cleaning;Community Activity;Driving;Laundry;Medication Management;Meal Prep;Occupation;Personal Finances;Shop;Yard Work;Volunteer    Stability/Clinical Decision Making Evolving/Moderate complexity    Rehab Potential Fair    PT Frequency 2x / week    PT Duration 12 weeks    PT Treatment/Interventions ADLs/Self Care Home Management;Cryotherapy;Electrical Stimulation;Moist Heat;Ultrasound;DME Instruction;Gait training;Stair training;Functional mobility training;Therapeutic exercise;Balance training;Patient/family education;Orthotic Fit/Training;Neuromuscular re-education;Wheelchair mobility training;Manual techniques;Passive range of motion;Dry  needling;Energy conservation;Taping;Visual/perceptual remediation/compensation;Joint Manipulations    PT Next Visit Plan core strength/motor control in short-sitting, sit to stand if able; Continue with progressive LE Strengthening. Stand activities as appropriate. Attempt Stedy (sit to stand device) in future visits.   PT Home Exercise Plan No changes to HEP today    Consulted and Agree with Plan of Care Patient;Family member/caregiver    Family Member Consulted             03/09/2023, 8:04 AM      8:26 AM, 06/27/23 Lenda Kelp PT Physical Therapist - [facility]  8:26 AM 06/27/23

## 2023-06-28 ENCOUNTER — Ambulatory Visit: Payer: Medicare Other

## 2023-06-28 ENCOUNTER — Ambulatory Visit: Payer: Medicare Other | Admitting: Occupational Therapy

## 2023-06-28 DIAGNOSIS — M62838 Other muscle spasm: Secondary | ICD-10-CM | POA: Diagnosis not present

## 2023-06-28 DIAGNOSIS — G8250 Quadriplegia, unspecified: Secondary | ICD-10-CM | POA: Diagnosis not present

## 2023-06-28 DIAGNOSIS — Z8673 Personal history of transient ischemic attack (TIA), and cerebral infarction without residual deficits: Secondary | ICD-10-CM | POA: Diagnosis not present

## 2023-06-28 DIAGNOSIS — Z8679 Personal history of other diseases of the circulatory system: Secondary | ICD-10-CM | POA: Diagnosis not present

## 2023-06-29 ENCOUNTER — Ambulatory Visit: Payer: Medicare Other

## 2023-06-29 ENCOUNTER — Encounter: Payer: Medicare Other | Admitting: Occupational Therapy

## 2023-07-03 ENCOUNTER — Ambulatory Visit: Payer: Medicare Other | Admitting: Occupational Therapy

## 2023-07-03 ENCOUNTER — Ambulatory Visit: Payer: Medicare Other

## 2023-07-05 ENCOUNTER — Ambulatory Visit: Payer: Medicare Other

## 2023-07-05 ENCOUNTER — Ambulatory Visit: Payer: Medicare Other | Attending: Physician Assistant | Admitting: Occupational Therapy

## 2023-07-05 DIAGNOSIS — R269 Unspecified abnormalities of gait and mobility: Secondary | ICD-10-CM | POA: Insufficient documentation

## 2023-07-05 DIAGNOSIS — M6281 Muscle weakness (generalized): Secondary | ICD-10-CM | POA: Diagnosis not present

## 2023-07-05 DIAGNOSIS — I6930 Unspecified sequelae of cerebral infarction: Secondary | ICD-10-CM | POA: Diagnosis not present

## 2023-07-05 DIAGNOSIS — R2681 Unsteadiness on feet: Secondary | ICD-10-CM | POA: Diagnosis not present

## 2023-07-05 DIAGNOSIS — R2689 Other abnormalities of gait and mobility: Secondary | ICD-10-CM | POA: Diagnosis not present

## 2023-07-05 DIAGNOSIS — R262 Difficulty in walking, not elsewhere classified: Secondary | ICD-10-CM | POA: Insufficient documentation

## 2023-07-05 DIAGNOSIS — R278 Other lack of coordination: Secondary | ICD-10-CM | POA: Insufficient documentation

## 2023-07-05 NOTE — Therapy (Signed)
 OUTPATIENT PHYSICAL THERAPY TREATMENT     Patient Name: Paul Hall MRN: [id number] DOB:Jul 16, 1995, 28 y.o., male Today's Date: 07/06/2023  PCP:  Cindi Carbon PROVIDER:  Lenise Herald, PA-C   PT End of Session - 07/05/23 1709     Visit Number 153    Number of Visits 160   date corrected from most recent recert   Date for PT Re-Evaluation 08/23/23    Authorization Type [employer]/ [place] Medicaid    Authorization Time Period 01/04/21-03/29/21; Recert 03/24/2021-06/16/2021; Recert 09/15/2021- 12/08/2021; Recert 12/13/2021-03/07/2022    Progress Note Due on Visit 160    PT Start Time 1616    PT Stop Time 1701    PT Time Calculation (min) 45 min    Equipment Utilized During Treatment Gait belt    Activity Tolerance Patient tolerated treatment well    Behavior During Therapy WFL for tasks assessed/performed                             Past Medical History:  Diagnosis Date   Diabetes mellitus (HCC)    Hypertension    Stroke (HCC)    Past Surgical History:  Procedure Laterality Date   IVC FILTER PLACEMENT ([facility] HX)     LEG SURGERY     PEG TUBE PLACEMENT     TRACHEOSTOMY     Patient Active Problem List   Diagnosis Date Noted   Sepsis due to vancomycin resistant Enterococcus species (HCC) 06/06/2019   SIRS (systemic inflammatory response syndrome) (HCC) 06/05/2019   Acute lower UTI 06/05/2019   VRE (vancomycin-resistant Enterococci) infection 06/05/2019   Anemia 06/05/2019   Skin ulcer of sacrum with necrosis of muscle (HCC)    Urinary retention    Type 2 diabetes mellitus without complication, with long-term current use of insulin (HCC)    Tachycardia    Lower extremity edema    Acute metabolic encephalopathy    Obstructive sleep apnea    Morbid obesity with BMI of 60.0-69.9, adult (HCC)    Goals of care, counseling/discussion    Palliative care encounter    Sepsis (HCC) 04/27/2019   H/O insulin dependent diabetes mellitus 04/27/2019   History of  CVA with residual deficit 04/27/2019   Seizure disorder (HCC) 04/27/2019   Decubitus ulcer of sacral region, stage 4 (HCC) 04/27/2019    REFERRING DIAG: Cerebral infarction, unspecified   THERAPY DIAG:  Muscle weakness (generalized)  Other lack of coordination  Difficulty in walking, not elsewhere classified  Abnormality of gait and mobility  Unsteadiness on feet  Other abnormalities of gait and mobility  History of CVA with residual deficit  Rationale for Evaluation and Treatment Rehabilitation  PERTINENT HISTORY: Paul Hall is a 26yoM who presents with severe weakness, quadriparesis, altered sensorium, and visual impairment s/p critical illness and prolonged hospitalization. Pt hospitalized in October 2020 with ARDS 2/2 COVID19 infection. Pt sustained a complex and lengthy hospitalization which included tracheostomy, prolonged sedation, ECMO. In this period pt sustained CVA and SDH. Pt has now been liberated from tracheostomy and G-tube. Pt has since been hospitalized for wound infection and UTI. Pt lives with parents at home, has hospital bed and left chair, hoyer lift transfers, and power WC for mobility needs. Pt needs heavy physical assistance with ADL 2/2 BUE contractures and motor dysfunction   PRECAUTIONS: Fall  SUBJECTIVE:  Patient reports feeling some better after having BOTOX injection last week.  PAIN:  Are you having pain? No. But  feeling tight.    TODAY'S TREATMENT:  - 06/26/2023     *Patient dependently hoyer transfer from his power chair to edge of mat with +2 assist (mom +PT) and later same assist for transfer back to w/c from mat (using patient provided hoyer pad) and then at end of visit transport via hoyer back from EOM back to power w/c  THERAPEUTIC ACTIVITIES:  - EOM- Static sitting  with dynamic reaching  with each hand for edge of mat x several min   -Dynamic Lateral weight shifting placing hand pressure onto each side of mat- 15 rep each side.   - anterior weight shifting at EOM x 15 reps.  - Ant weight shifting into some posterior weight shifting x 20 reps  -Seated hip march x 20 reps  -Seated knee ext 2 x 10 reps ea LE  - Static sit with attempting to extend arms to reach for EOM- improved and able to touch at end of visit just as in the front.                 PATIENT EDUCATION: Education details: Exercise technique with VC   HOME EXERCISE PROGRAM:  Access Code: [email or web address] URL: [email or web address] Date: 03/23/2022 Prepared by: Maureen Ralphs  Exercises - Supine Bridge  - 3 x weekly - 3 sets - 10 reps - 2 hold - Supine Gluteal Sets  - 3 x weekly - 3 sets - 10 reps - 5 sec hold - Supine Quad Set  - 1 x daily - 3 x weekly - 3 sets - 10 reps - 5 hold - Seated Long Arc Quad  - 1 x daily - 7 x weekly - 3 sets - 10 reps - Seated Hip Adduction Squeeze with Ball  - 1 x daily - 3 x weekly - 3 sets - 10 reps - 5 hold - Seated Hip Abduction  - 1 x daily - 3 x weekly - 3 sets - 10 reps - 2 hold    PT Short Term Goals -       PT SHORT TERM GOAL #1   Title Pt will be independent with HEP in order to improve strength and balance in order to decrease fall risk and improve function at home and work.    Baseline 01/04/2021= No formal HEP in place; 12/12 no HEP in place; 05/10/2021-Patient and his father were able to report compliance with curent HEP consisting of mostly seated/reclined LE strengthening. Both verbalize no questions at this time.    Time 6    Period Weeks    Status Achieved    Target Date 02/15/21            PT LONG TERM GOAL #1     Title Patient will increase BLE gross strength by 1/2 muscle grade to improve functional strength for improved independence with potential gait, increased standing tolerance and increased ADL ability.     Baseline 01/04/2021- Patient presents with 1/5 to 3-/5 B LE strength with MMT; 12/12: goal partially met for Left knee/hip; 05/10/2021= 2-/5 bilateal Hip flex;  3+/5 bilateral Knee ext; 06/21/2021= Patient presents with 2-/5 bilateral Hip flex; 3+/5 bilateral knee ext/flex; 2-/5 left ankle DF; 0/5 right ankle- and able to increase reps and resistance with LE's. 09/15/2021- Patient technically presents with 2-/5 B hip flex/abd/add - but he is able to raise his hip up to approx 100 deg which has improved. 3+/5 Bilateral knee ext, 2-/5 left ankle and 0/5 right ankle.  12/08/2021= Patient able  to lift left knee at 110 deg of hip flex; presents with 3+/5 knee ext, 2-/5 left ankle DF and 0/5 right ankle DF, 2-/5 bilateral Hip abd in seated position.    12/6: R: knee 3+/5 ext, 2/5 flexion, left knee 3+/5 extension, 3+/5 flexion, R hip: 2+/5 hip add, 2+/5 hip ABD L hip: 4-/5 hip ABD, 3+/5 hip ADD, 3+/5 hip flexion; 06/06/2022= Patient now presents with 2-/5 right ankle DF/PF;     Time 12     Period Weeks     Status MET    Target Date 03/07/2022         PT LONG TERM GOAL #2    Title Patient will tolerate sitting unsupported demonstrating erect sitting posture for 15 minutes with CGA to demonstrate improved back extensor strength and improved sitting tolerance.     Baseline 01/04/2021- Patient confied to sitting in lift chair or electric power chair with back support and unable to sit upright without physical assistance; 12/12: tolerates <1 minutes upright unsupported sitting. 05/10/2021=static sit with forward trunk lean  in his power wheelchair without back support x approx 3 min. 06/21/2021=Unable to assess today due to patient with acute back pain but on previous visit able to sit x 8 min without back support. 09/15/2021- on last visit- 09/13/2021- patient was able to sit unsupported x 8 min at edge of mat. 10/13/2023 - Patient was able to sit at edge of mat with varying level of assist today from SBA to min A for a total of 20 min. 12/13/2021= Patient demonstrated unsupported sitting at edge of mat for approx 20 min    Time 12     Period Weeks     Status GOAL MET    Target Date  12/08/21          PT LONG TERM GOAL #3    Title Patient will demonstrate ability to perform static standing in // bars > 2 min with Max Assist  without loss of balance and fair posture for improved overall strength for pre-gait and transfer activities.     Baseline 01/04/2021= Patient current uanble to stand- Dependent on hoyer or sit to stand lift for transfers. 05/10/2021=Not appropriate yet- Currently still dependent with all transfers using hoyer. 06/21/2021= Patient continuing now to focus on LE strengthening to prepare for standing-unable to try today due to acute low back pain-  planning on attempting in new cert period. 09/15/2021- Patient has attempted standing 2x in past two week- max Assist of 2 people - only once was he successful to clearing his bottom from chair - Will continue to be a focus during the new certification. 12/13/2021= Patient has been limited secondary to increased overall low back pain during this certification and will require more time to focus on this goal.  12/6: not assessed this date, will assess at date when 2-3 PTs are present for assistance     Time 12     Period Weeks     Status GOAL not appropriate at this time - may attempt in future once Patient presents with improved overall LE strength.                 PT LONG TERM GOAL #4    Title Pt will improve FOTO score by 10 points or more demonstrating improved perceived functional ability     Baseline FOTO 7 on 10/17; 03/15/21: FOTO 12; 05/10/2021 06/21/2021= 1; 09/15/2021= 9; 12/13/2021= Will issue next visit 12/6: 4; 06/06/2022= Will assess next visit;  Goal not appropriate as patient is not ambulatory.     Time 12     Period Weeks     Status WITHDRAWN    Target date 11/30/2022         PT LONG TERM GOAL #5    Title Patient will perform sit to stand transfer with appropriate AD and max assist of 2 people with 75% consistency to prepare for pregait activities.     Baseline 09/15/2021= Patient unable to stand well- unable to  clear his bottom off chair with Max assist of 2 persons. 12/13/2021- Goal not appropriate to try yet but will keep and roll over to next cert as shift continues to focus on transfers/standing; 4/24= Patient able to perform active ankle DF/PF with right LE and able to raise his knee into seated march and clear floor without physical assist today - as previously unable as well as lift right knee ext to near full ROM to improve strength for eventual standing. 09/07/2022- Goal continues to not be appropriate but patient is now standing some and will keep goal active; 11/23/2022= patient is now performing sit to stand with max A +2 and now able to clear his bottom off mat. 11/30/2022- Patient continues to perform sit to stand transfers with Max A+2 and able to clear bottom well off mat but not erect yet. 05/31/2023- Patient is able to stand consistently with max A +2 - unable to achieve full knee or trunk ext yet is able to clear bottom off mat for improved LE weightbearing.     Time 12     Period Weeks     Status PROGRESSING    Target date  08/23/2023       PT LONG TERM GOAL #6  Title Patient will tolerate sitting unsupported demonstrating erect sitting posture for 30 minutes with CGA to demonstrate improved back extensor strength and improved sitting tolerance.   Baseline 12/13/2021= Patient demonstrated unsupported sitting at edge of mat for approx 20 min;  12/29/2021- Patient performed approx 30 min of dynamic sitting activities today. 06/06/2022= Patient demonstrated ability to sit and perform static and dynamic UE/LE movement with only Supervision.   Time 12   Period Weeks   Status GOAL MET           7.  Patient will tolerate 2 minutes or more of standing in sit to stand lift or with max assist +2 in order to indicate improved lower extremity weightbearing tolerance for progression to standing in parallel bars. Baseline: 1 minute on most recent stand 02/21/22; 06/06/2022- Patient did attempt today after  complaining of right LE pain last week. He attempted 3 stands using sit to stand lift. - all over 1 min- last one approx 48 sec- stopped due to fatigue. More erect standing in lift today - still poor gluteal strength but able to activate glutes and extend hips upon command but unable to hold > 5 sec. 07/28/2022- Will attempt standing next visit but patient has been able to stand since last progress note for up to 3 min at a time at his best. 09/07/2022-  attempted standing with max +2 and patient able to stand 15-20 sec so will now  revise goal; 11/23/2022- Patient at his best between last 2 visits was able to stand approx 50 sec with max A+2.  11/30/2022- Patient performed 4 trials of static standing - max A +2- clearing bottom and using forearms to bear weight  03/15/2023: Patient performed 2 trials of static  standing - max A +2- clearing edge of mat and using forearms to bear weight for approximately 45 seconds, is able to come into approximately 45 degrees of knee flexion from sit to stand; 05/31/2023- Patient has not attempted standing in 1 month but able to perform x 4 trials with max A +2 (PT and his mom) and able to clear his bottom off mat and attempted to activate his glutes and apply some forearm pressure - able to stand between 25-40 sec today.  Goal status: PROGRESSING Target date: 08/23/2023   8. Patient will tolerate sitting unsupported demonstrating ability to perform dynamic UE/LE activities for 30 minutes  independently to demonstrate ability to sit at edge of bed to eat or perform some ADL's/exercise for optimal quality of life.             Baseline: 06/06/2022- Patient able to sit and perform some UE/LE exercises but requires CGA at times for safety- he is able to static sit for 30 min with supervision. 07/27/2022= Patient able to now sit > 30 min with Supervision only - performing static and dynamic activities. Will keep goal active to ensure patient is consistent. 09/07/2022=  Patient able to  demonstrate consistent ability to sit at edge of mat during visits             Goal Status: MET             Target date: 08/29/2022        Plan     Clinical Impression Statement Treatment continues to focus on dynamic activities incorporating trunk support and dynamic weight shifting. He performed better after having botox injections and stated not quite as tight. He did present with some improved trunk mobility and ability to use right arm for some light weight bearing. Pt will continue to benefit from skilled physical therapy intervention to address impairments, improve QOL, and attain therapy goals.    Personal Factors and Comorbidities Comorbidity 3+;Time since onset of injury/illness/exacerbation    Comorbidities CVA, diabetes, Seizures    Examination-Activity Limitations Bathing;Bed Mobility;Bend;Caring for Others;Carry;Dressing;Hygiene/Grooming;Lift;Locomotion Level;Reach Overhead;Self Feeding;Sit;Squat;Stairs;Stand;Transfers;Toileting    Examination-Participation Restrictions Cleaning;Community Activity;Driving;Laundry;Medication Management;Meal Prep;Occupation;Personal Finances;Shop;Yard Work;Volunteer    Stability/Clinical Decision Making Evolving/Moderate complexity    Rehab Potential Fair    PT Frequency 2x / week    PT Duration 12 weeks    PT Treatment/Interventions ADLs/Self Care Home Management;Cryotherapy;Electrical Stimulation;Moist Heat;Ultrasound;DME Instruction;Gait training;Stair training;Functional mobility training;Therapeutic exercise;Balance training;Patient/family education;Orthotic Fit/Training;Neuromuscular re-education;Wheelchair mobility training;Manual techniques;Passive range of motion;Dry needling;Energy conservation;Taping;Visual/perceptual remediation/compensation;Joint Manipulations    PT Next Visit Plan core strength/motor control in short-sitting, sit to stand if able; Continue with progressive LE Strengthening. Stand activities as appropriate. Attempt Stedy  (sit to stand device) in future visits.   PT Home Exercise Plan No changes to HEP today    Consulted and Agree with Plan of Care Patient;Family member/caregiver    Family Member Consulted             03/09/2023, 8:04 AM      1:47 PM, 07/06/23 Lenda Kelp PT Physical Therapist - [facility]  1:47 PM 07/06/23

## 2023-07-06 NOTE — Therapy (Signed)
 Occupational Therapy Neuro Treatment Note     Patient Name: Paul Hall MRN: [id number] DOB:26-Sep-1995, 28 y.o., male Today's Date: 07/06/2023  PCP: Dr. Sherwood Gambler REFERRING PROVIDER: Dr. Sherwood Gambler    OT End of Session - 07/06/23 0847     Visit Number [id number]    Number of Visits 132    Date for OT Re-Evaluation 08/02/23    OT Start Time 1536    OT Stop Time 1615    OT Time Calculation (min) 39 min    Activity Tolerance Patient tolerated treatment well    Behavior During Therapy WFL for tasks assessed/performed                        Past Medical History:  Diagnosis Date   Diabetes mellitus (HCC)    Hypertension    Stroke [facility])    Past Surgical History:  Procedure Laterality Date   IVC FILTER PLACEMENT ([facility] HX)     LEG SURGERY     PEG TUBE PLACEMENT     TRACHEOSTOMY     Patient Active Problem List   Diagnosis Date Noted   Sepsis due to vancomycin resistant Enterococcus species (HCC) 06/06/2019   SIRS (systemic inflammatory response syndrome) (HCC) 06/05/2019   Acute lower UTI 06/05/2019   VRE (vancomycin-resistant Enterococci) infection 06/05/2019   Anemia 06/05/2019   Skin ulcer of sacrum with necrosis of muscle (HCC)    Urinary retention    Type 2 diabetes mellitus without complication, with long-term current use of insulin (HCC)    Tachycardia    Lower extremity edema    Acute metabolic encephalopathy    Obstructive sleep apnea    Morbid obesity with BMI of 60.0-69.9, adult (HCC)    Goals of care, counseling/discussion    Palliative care encounter    Sepsis (HCC) 04/27/2019   H/O insulin dependent diabetes mellitus 04/27/2019   History of CVA with residual deficit 04/27/2019   Seizure disorder (HCC) 04/27/2019   Decubitus ulcer of sacral region, stage 4 (HCC) 04/27/2019   ONSET DATE: 01/2019  REFERRING DIAG: CVA/COVID-19  THERAPY DIAG:  Muscle weakness (generalized)  Other lack of coordination  Rationale for Evaluation and Treatment  Rehabilitation  SUBJECTIVE:   SUBJECTIVE STATEMENT:  Pt./caregiver report they anticipate moving into a new home this month.  Pt accompanied by: self and family member  PERTINENT HISTORY:  Pt. is a 28 y.o. male who was diagnosed with COVID-19, and CVA with resultant quadriplegia. Pt. Was then hospitalized with VRE UTI. PMHx includes: urinary retention, seizure disorder, obstructive sleep disorder, DM Type II, Morbid obesity.   PRECAUTIONS: None  WEIGHT BEARING RESTRICTIONS No  PAIN:  No reports of pain Are you having pain? Discomfort with right wrist, and digit PIP ROM 2/2 increased tightness.  FALLS: Has patient fallen in last 6 months? No  LIVING ENVIRONMENT: Lives with: lives with their family Lives in: House/apartment Stairs: No Level Entry Has following equipment at home: Wheelchair (power) and hoyer lift, sit to stand lift  PLOF: Independent  PATIENT GOALS: To be able to engage in more daily care tasks.  OBJECTIVE:   HAND DOMINANCE: Right  ADLs:  Transfers/ambulation related to ADLs: Eating: Pt. reports being able to hold standard utensils, and is starting to engage more in self-feeding tasks, hand to mouth patterns. Pt. Reports that he does as much as he can with the task, and family assists  with the remainder of the task. Grooming: Pt. Is able to initiate holding  an electric toothbrush, and brush his teeth. Family assists LB Dressing: Total Assist UE dressing: Pt. is now able to reach up to actively assist with grasping , and pulling his gown down. Toileting:  Total Assist Bathing: MaxA UB, Total assist LB Tub Shower transfers: N/A Equipment: See above    IADLs: Shopping: Relies on family to assist Light housekeeping: Total Assist Meal Prep: Total Assist Community mobility:   Medication management:  Total Assist  Financial management: N/A Handwriting: Not legible: Pt. Is able to hold a pen with the left hand, and initiate marking the page. Pt.'s eye  glasses were not available  MOBILITY STATUS:  Power w/c  POSTURE COMMENTS:  Pt. Requires position changes in his power w/c  ACTIVITY TOLERANCE: Activity tolerance:  Fair  FUNCTIONAL OUTCOME MEASURES: FOTO: 40  TR score: 45  05/25/2022:   FOTO: 50 TR score: 45  07/20/22: FOTO 55  03/08/23: 51  UPPER EXTREMITY ROM     Active ROM Left eval Left  03/21/2022 Left 05/25/2022 L  07/20/22 Left 07/25/2022 Left  08/15/2022 Left  09/26/2022 Left  11/16/2022 Left 12/28/2022 Left 03/13/23 Left 05/10/23  Shoulder flexion 119 123 125 130  136 138 130 142 144 144  Shoulder abduction 110 115 115 115  118 121 125 142 140 140  Shoulder adduction             Shoulder extension             Shoulder internal rotation             Shoulder external rotation             Elbow flexion 135(135) 145 145 145  145    145 145  Elbow extension -27(-18) -21(-20) -20(-16)  -25(-10) -23(-10) -21(-10) -20(-18) -20(-18) -20(-17) -17(-16)  Wrist flexion             Wrist extension 10(50) 19(50) 28(50)  30(50) 35(55) 36(55) 36(60) 40(60) 45(60) 45(60)  Wrist ulnar deviation             Wrist radial deviation             Wrist pronation             Wrist supination   Limited by flexor tone       10(15)   (Blank rows = not tested)    Active ROM Right eval Right 03/21/2022 Right 05/25/2022 R  07/20/22 Right  07/25/22 Right 08/15/2022 Right 09/26/2022 Right 11/16/2022 Right 12/28/2022 Right 03/13/23 Right 05/10/23  Shoulder flexion 106 scaption 105  Scaption 62 flexion 110 scaption 100  105 105 105 112 Scaption 123 Scaption 122  Scaption  Shoulder abduction 114 90 97 100  105 117 120 124 124 122  Shoulder adduction             Shoulder extension             Shoulder internal rotation             Shoulder external rotation             Elbow flexion 120(130) 145 145 140  144 140 142 148 148 145  Elbow extension -45(-35) -22(-35) -28(-22)  -32(-21) -32(-28) -28(-26) -28(-28) -27(-26) -27(-40) -38(-35)   Wrist flexion             Wrist extension -30(10) -25(10) -10(30)  -10(20) -10(25) -20(40) -30(10) -22(34) -22(35) -32(22)  Wrist ulnar deviation  Wrist radial deviation             Wrist pronation             Wrist supination   Limited by flexor tone            12/28/2022: Thumb radial abduction: Right: 12(46), Left: 40(54)  Digits: Bilateral 2nd through 5th digit: MP Hyperextension with PIP, an DIP flexion     UPPER EXTREMITY MMT:     Left eval Left 03/21/2022 Left  05/25/2022 L 07/20/22 Left  08/15/2022 Left 09/26/2022 Left 11/16/2022 Left 12/28/2022 Left  03/13/2023 Left  05/10/23  Shoulder flexion 3-/5 3/5 3+/5 4+/5 4+/5 4+/5 4+/5 4+/5 4+/5 4+/5  Shoulder abduction 3-/5 3-/5 4-/5 4+/5 4+/5 4+5 4+/5 4+/5 4+/5 4+/5  Shoulder adduction            Shoulder extension            Shoulder internal rotation            Shoulder external rotation            Elbow flexion 3/5 3+/5 4-/5 4+/5 5/5 5/5 5/5 5/5 5/5 5/5  Elbow extension 2-/5 2/5  4+/5 4+/5 4+/5  4/5 4/5 4/5 through available range  Wrist flexion            Wrist extension 2/5 2+/5 3-/5  3-/5 3-/5 3-/5 3/5 3/5 3/5  Wrist ulnar deviation            Wrist radial deviation            Wrist pronation            Wrist supination            (Blank rows = not tested)  Right eval Right 03/21/2022 Right  05/25/2022 R 07/20/22 Right 08/15/2022 Right 09/26/2022 Right 11/16/2022 Right 12/28/2022 Right 03/13/2023 Right 05/10/23  Shoulder flexion 3-/5 scaption 3-/5 3-/5 3+/5 3+/5 3+/5 3/5 3/5 3/5 3+/5  Shoulder abduction 3-/5 3-/5 3-/5 4/5 4/5 4/5 4/5 4/5 4/5 4/-5  Shoulder adduction            Shoulder extension            Shoulder internal rotation            Shoulder external rotation            Elbow flexion 3/5 3/5 3+/5 4/5 4+/5 4+/5 4+/5 4+/5 4+/5 4+/5  Elbow extension 2-/5 2/5 3-/5 4+/5 4+/5 4+/5 4+/5 4-/5 4-/5 4-/5  Wrist flexion            Wrist extension 2-/5 2-/5 2/5 2/5 2/5 2/5 2/5 2/5 2/5 2/5  Wrist ulnar  deviation            Wrist radial deviation            Wrist pronation            Wrist supination              HAND FUNCTION:  Grip strength: Right: 0 lbs; Left: 0 lbs and Lateral pinch: Right: 5 lbs, Left: 2 lbs  03/21/2022: Lateral pinch: Right: 3.5 lbs, Left: 2 lbs  07/20/22: Grip strength: Right: 3 lbs; Left: 4 lbs and Lateral pinch: Right: 5 lbs, Left: 3 lbs  08/15/2022:  Grip strength: Right: 0 lbs; Left: 5 lbs and Lateral pinch: Right: 2 lbs, Left: 4 lbs  09/26/2022  Grip strength: Right: 0 lbs; Left: 8 lbs and Lateral pinch: Right: 2 lbs, Left:  5 lbs  11/16/2022  Grip strength: Right: 0 lbs; Left: 8 lbs  9/25/20204  Grip strength: Right: 0 lbs; Left: 8 lbs  Grip strength: Right: 0 lbs; Left: 8 lbs   03/08/23:  Grip strength: Right: 0 lbs without PROM, 5 lbs after PROM, Left 8 lbs  05/10/23:  Grip strength: TBD  Bilateral digit PIP/DIP flexion contractures with MP hyperextension with attempts for AROM. Pt. is able to tolerate AROM to the bilateral digits at the initial evaluation however, has a history of pain in the digits.  COORDINATION: Eval: Pt. is unable to grasp 9-hole test pegs. Pt. is able to initiate grasping larger pegs, and is able to hold a pen in the left hand.  07/20/22: 2 min 36 seconds to remove 9 pegs from 9 hole peg test - cues to locate pegs 2/2 low vision. Pt. is able to initiate grasping larger pegs on R hand and is able to hold a pen in the left hand.  08/15/2022:    Vision, and sensation limiting accuracy of 9 hole peg test results. Pt. Was able to grasp and remove vertical pegs intermittently with cues.    05/10/23: TBD  SENSATION: Light touch: Impaired   EDEMA:  N/A  MUSCLE TONE: BUE flexor Spasticity  COGNITION Overall cognitive status: Continue to assess in functional context  VISION:   Subjective report: Pt. was not wearing glasses at the time of the initial eval.  Baseline vision: Vision is very limited. Wears glasses all  the time Visual history: History of impaired vision following CVA. Pt. Has received treatment through the [facility] low vision rehabilitation program.   VISION ASSESSMENT: Impaired To be further assessed in functional context  PERCEPTION: Impaired   PRAXIS: Impaired: motor planning  OBSERVATIONS:  Pt reports being on Tramadol   TODAY'S TREATMENT    Therapeutic Ex.:  -Emphasis was placed on slow prolonged gentle passive stretching for RUE, forearm supination, bilateral wrist extension, wrist extension, digit MP, PIP, and DIP extension following moist heat, and left wrist flexion/extension, PIP/DIP flexion, and extension while heat was applied to the RUE. PROM was performed to decrease stiffness, and prepare the RUE for functional use. ROM was performed to the shoulder right elbow, forearm, wrist, and digits. PROM for bilateral digit MP, PIP, and DIP extension in preparation for placing them onto a flat surface  Neuromuscular re-education:  -Facilitated [facility] skills grasping 1.5" flat discs, and sliding them off the edge of an elevated surface raking them with his fingers to the thumb.   -Pt. worked on sustaining grasp on the flat discs and goal-directed reaching out at multiple angles to prepare to slide them through a disc sized slot in the lid of a jar.  -Worked on isolating 2nd digit extension in preparation for pressing the discs through the slots.       PATIENT EDUCATION: Education details: stretching, PROM, massage to the digits on the bilateral hands. Person educated: Patient and Parent Education method: Explanation Education comprehension: verbalized understanding  HOME EXERCISE PROGRAM:  Continue ongoing assessment, and continue to provide as needed.   GOALS: Goals reviewed with patient? Yes  SHORT TERM GOALS: Target date: 11/07/2022   To assess splint fit, and make appropriate adjustments to promote good skin integrity through the palmar surface of the bilateral hands.   Baseline: 05/25/22: Goal currently met, however ongoing as needs to assess splint fit arise. 03/23/2022: Pt. is wearing splints a couple of hours at night bilateral resting hand splints. 03/21/2022: Pt. is wearing  splints a couple of hours at night bilateral resting hand splints. Goal status: Deferred   LONG TERM GOALS: Target date: 08/02/2023    FOTO score will Improve by 2 points for Pt. perceived improvement with the assessment specific ADL/IADL tasks.  Baseline:  03/08/23: 51; 12/28/2022: FOTO score: 56; 11/16/2022: 52 09/27/2022: 51 07/20/2022: FOTO 55 05/25/2022: FOTO score: 50,  TR score: 45 Eval: FOTO score: 40,  TR score: 45 Goal status: Achieved   2.   Pt. will independently perform oral care for 100% of the task after complete set-up. Baseline: 05/10/23: Pt. Performs 90% of the task. Pt.'s mother assists with 10% of the task for thorough cleaning the hard to reach places way in the back of the oral cavity.03/08/23: not as much assist required proximally, but to adjust positioning of toothbrush; 02/13/23: Pt. Continues to require assist proximally 01/09/2023: Continue 12/28/2022:  Pt. Continues to complete 90% of the task with complete set-up, with visual cues, and occassional assist of the right elbow. 11/16/2022: Pt. Completes 90% of the task with complete set-up. 09/26/2022: Completes 90% of the task, proximal assist at times required at the elbow.  08/15/2022: Completes 90% of the task, proximal assist at times required at the elbow. 07/20/22: completes 90% of task, limited by shoulder flexion. 05/25/2022:  Pt. Is able to initiate and perform oral care for approximately 90% of the task. Complete set-up required. Assi needed only for the very back teeth. 03/23/2022: Pt. Is able to initiate and perform oral care for approximately 75% of the task. Pt. Requires assist at proximally at the elbow for through oral care. 03/21/2022: Pt. Is able to initiate and perform oral care for approximately 75% of the task.  Pt. Requires assist at proximally at the elbow for through oral care. Eval: Pt. is able to initiate using an electric toothbrush. Pt. requires assist for set-up, and assist for thoroughness, and as he Pt. fatigues. Goal status:  Partially met, Goal discontinued  3.  Pt. Will be modified independence with self-feeding for 100% using a swivel spoon, and standard fork after complete set-up Baseline: 05/10/2023: Pt. feeds self 95-100% of the meal. with occasional assist to reposition the utensil. 03/08/23: indep with cup with a handle and lid, assist to reposition eating utensils in hand;  02/13/23:  Pt. Is now able to consistently, and efficiently use a swivel spoon for eating cereal with the left hand.01/09/2023: Continue 12/28/2022: Pt. Is now feeding himself 90% of the time using a swivel spoon with the left hand, and 95% of the time with the fork. Pt. Continues to have difficulty with cutting food. 11/16/2022: 100% with finger foods, Pt. Is initiating with a swivel spoon, and universal cuff for fork use, however requires assist for consistency, and accuracy.  09/26/2022: 90% using the spoon, assist at time with the forearm motion, and grip on the utensils. 100% for finger foods.  08/15/2022:  90% using the spoon, assit at time with the forearm motion, and grip on the utensils. 100% for finger foods-independent with set-up- unsupervised.  07/20/22: self-feeds cereal using spoon 90% of task. 05/25/2022: Pt. Is able to use a spoon to scoop cereal when feeding himself cereal 85% of the time. Pt. Is able to feed himself snack/finger foods 100% of the time. Pt. Continues to work on consistency of  stabilizing a cup/mug when drinking. Pt. Is able to grasp a water bottle with assist initially, with assist tapering off as he drinks.03/23/2022: Pt. Is able to perform scooping cereal for 75%  of the time. Pt. required assist, and support at the left elbow, and Pt. Presents with limited forearm supination when using the spoon, and  bringing it towards his mouth. Pt. Is able to use a fork to spear items, and perform the hand to mouth pattern.  03/21/2022: Pt. Is able to perform scooping cereal for 75% of the time. Pt. required assist, and support at the left elbow, and Pt. presents with limited forearm supination when using the spoon, and bringing it towards his mouth. Pt. Is able to use a fork to spear items, and perform the hand to mouth pattern.  Eval: Pt. is able to hold standard standard utensils. Pt. Performs as much of the task as he, can and has assistance for the remainder. Goal status:  Improved, Achieved  4.  Pt. will improve grasp patterns and consistently grasp 1/4" objects for ADL, and IADL tasks.  Baseline: 05/31/2023: Continue 05/10/23: Pt. is consistently able to grasp 1/2" objects with visual cues. Pt. Requires assist, and increased visual cues to grasp 1/4" objects on an elevated plane.  Pt. Is unable to grasp the flat at the tabletop surface. Pt. Is grasping grasping small puppy vitamins.03/08/23: 1/2" objects efficiently; 02/13/2023: 1/2" objects efficiently 01/09/2023: Continue 12/28/2022: Continues to grasp 1/4" pegs with min a + visual cues, consistently grasping 1/2" objects with visual cues 11/16/2022: Continues to grasp 1/4" pegs with min a + visual cues, consistently grasping 1/2" objects with visual cues 09/26/2022: Continues to grasp 1/4" pegs with min a + visual cues, consistently grasping 1/2" objects with visual cues. 08/15/2022: grasps 1/4" pegs with min a + visual cues, consistently grasping 1/2" objects with visual cues. 07/20/22: grasps 1/4" pegs with min a + visual cues, consistently grasping 1/2" objects with visual cues. 05/25/2022: Pt. Is working on improving consistency of grasping 1/2" objects with visual cues.  03/23/2022: Pt. Is able to grasp 1" objects consistently,and continues to work on the hand patterns needed to grasp 1/2" objects.03/21/2022: Pt. Is able to grasp 1" objects consistently,and  continues to work on the hand patterns needed to grasp 1/2" objects. Eval: Pt. is able to grasp 1" objects intermittently using a lateral grasp pattern. Goal status: Ongoing  5.  Pt. will independently write his name legibly with letter sizes under 1". Baseline: 05/10/23: Deferred 03/08/23: Not addressed this period; 02/13/2023: Continue 01/09/2023: Continue. 12/28/2022: Pt. is now using an IPad Pen. 11/16/2022: Pt. Continues to be able to write name with smaller letter size. Pt. Is signing name with a computer styus. Pt. Requires visual cues, and assist 09/26/2022: Pt. Continues to be able to write name with smaller letter size. Pt. Is signing name with a computer styus. Pt. Requires visual cues, and assist. 08/15/2022: Pt. Is able to write name with smaller letter size. Pt. Is signing name with a computer styus. Pt. Requires visual cues, and assist. 07/20/22: stabilizing assist to write name with 75% legibility with 2" letters. 05/25/2022: Pt. to continue to work towards formulating grasp patterns in preparation for grasping a large width pen. Pt. Requires visual cues. 03/23/2022: Pt. Is able to write his name with modA, however has difficulty with formulating letter sizes less than 2" in size with 50% legibility for the 3 letters of his name.03/21/2022: Pt. Is able to write his name with modA, however has difficulty with formulating letter sizes less than 2" in size with 50% legibility for the 3 letters of his name. Eval: Pt is able to hold a thin marker with his left  hand, and formulate a line, and initiate a circular pattern (Pt. without glasses today) Goal status: Deferred  6. Pt. Will reach up to comb/brush his hair with minA.  Baseline: 05/10/23: Pt. is able to use a pick independently with the left hand with occasional assist to the far side of the left his head. 03/08/23: Pt uses pick in L hand to reach 75% of his head; unable to reach R side of head; 02/13/2023:Pt. is able to use a hair pick with the left  hand on the right side of his head, top, and back of the head. Pt. Continues to have difficulty reaching further to the right side of his head with the left. 01/09/2023: Continue 12/28/2022: Pt. Is progressing, and is now able to use a hair pick with the left hand on the right side of his head, top, and back of the head. Pt. Continues to have difficulty reaching further to the right side of his head with the left. 11/16/2022: Pt. Is able to use a hair pick for the left side of his head using his left hand. Pt. Presents with difficulty reaching to the right side of his head.09/26/2022: Pt. Is able to use a hair pick for the left side of his head using his left hand. Pt. Presents with difficulty reaching to the right side of his head. 5/13/202: 75% using a hair pick, assist to the right side of the head. 07/20/22: reaches 75% of head, assist for far R side of head, fatigues quickly. 05/25/2022: Pt. Is able to reach up with the left hand to the left side, top, and back of his head. 03/23/2022: Pt. is now able to more consistently initiate reaching up to his head with his left hand in preparation for haircare12/18/2023: Pt. is now able to more consistently initiate reaching up to his head with his left hand in preparation for haircare. Eval: Pt. is able to initiate reaching up for hair care with a long handled brush, however is unable to sustain UE's in elevation to perform the task.     Goal status: Achieved  7. Pt. Will independently navigate the w/c through his environment with minA with visual scanning, and hand placement on the controls.  Baseline: 11/16/2022: Deferred 2/2 vision 09/26/2022: Pt. Is now navigating his w/c ouside the home, and around his block. 08/15/2022: Pt. Requires minA to setup hand on controls and MIN cues to navigate the w/c in wide spaces, requires MIN - MOD cues to navigate the w/c through more narrow doorways, and tighter turns - varies based on good vs bad vision days.07/20/22: Pt. Requires  minA to setup hand on controls and MIN cues to navigate the w/c in wide spaces, requires MIN - MOD cues to navigate the w/c through more narrow doorways, and tighter turns - varies based on good vs bad vision days. 05/25/2022: Pt. Requires minA  and  cues to navigate the w/c in wide spaces, and requires Mod cues to navigate the w/c through more narrow doorways, and tighter turns. Pt. Requires max cues for scanning through the environment, and moderate cues for hand placement on the controls.     Goal status: Deferred 2/2 vision    8. Pt. Will improve bilateral grip strength to be able to independently grasp, and pull up blankets, and linens.   Baseline: 05/10/23: Deferred 03/08/23: R grip 0-5 lbs, L grip 8 lbs; pt reports he can consistently pull up blankets and linens independently.  02/13/2023: Continue. Pt. presents with digit PIP,  and DIP flexor tightness. 01/09/2023: Continue 12/28/2023: R: 0#, L: 5# Pt. Is now able to more consistently grasp and pull blankets up over him. 11/16/2022: R: 0 L: 8# Pt. Is more consistently grasping his blankets and attempting to pull them up. 09/26/2022: R: 0  L: 8# Pt. is attempting to grasp, and pull blankets up more, using mostly the left hand. 08/15/2022: Pt. has difficulty securely holding, and pulling up blankets, and linens.    Goal status:Deferred  9. Pt. will consistently actively control the releasing of blankets, covers, and linens from his hands once they are in the desired position over him. Baseline:12/28/2022: Pt. Is consistently actively able to release linens once they are in the desired position over him. 11/16/2022: Pt. continues to improve with actively releasing blankets form his hands. 09/26/2022: Pt. is improving with actively releasing blankets form his hands. 08/15/2022: Pt. has difficulty with controlled releasing of blankets/linens from his grasp.     Goal status: Achieved    10. Pt. Will improve bilateral UE strength by 2 MM grades to assist with ADLs,  and IADLs  Baseline: 05/31/2023: Continue 05/10/23: Right: shoulder flexion: 3+/5, abduction: 44-/5, elbow flexion: 4+/5 , extension: 4-/5 wrist extension: 2/5; Left: shoulder flexion: 4+/5, abduction: 4+/5, elbow flexion: 5/5 , extension: 4/5  wrist extension: 3/5 03/13/2023: Right: shoulder flexion: 3/5, abduction: 4/5, elbow flexion: 4+/5 , extension: 4-/5 wrist extension: 2/5; Left: shoulder flexion: 4+/5, abduction: 4+/5, elbow flexion: 5/5 , extension: 4/5  wrist extension: 3/5 03/08/23: NT this date d/t as pt arrived late; 02/13/2023: Continue 01/09/2023: Continue 12/28/2022: Right: shoulder flexion: 3/5, abduction: 4/5, elbow flexion: 4+/5 , extension: 4-/5 wrist extension: 2/5; Left: shoulder flexion: 4+/5, abduction: 4+/5, elbow flexion: 5/5 , extension: 4/5  wrist extension: 3/5 Right: shoulder flexion: 3+/5, abduction: 4/5, elbow flexion: 4+/5 , extension: 4+/5 wrist extension: 2/5; Left: shoulder flexion: 4+/5, abduction: 4+/5, elbow flexion: 5/5 , extension: 4+/5 wrist extension: 3-/5 11/16/2022: Right: shoulder flexion: 3/5, abduction: 4/5, elbow flexion: 4+/5 , extension: 4+/5 wrist extension: 2/5; Left: shoulder flexion: 4+/5, abduction: 4+/5, elbow flexion: 5/5 , extension:  wrist extension: 3-/5 Right: shoulder flexion: 3+/5, abduction: 4/5, elbow flexion: 4+/5 , extension: 4+/5 wrist extension: 2/5; Left: shoulder flexion: 4+/5, abduction: 4+/5, elbow flexion: 5/5 , extension: 4+/5 wrist extension: 3-/5 Goal status: Ongoing  11. Pt. will improve right wrist extension by 10 degrees to initiate reaching for items in preparation for ADLs. Baseline: 05/31/23: Continue 05/10/23: Right wrist extension: -32(22) 03/13/23: -22(35) 03/08/23: NT this date as pt arrived late; 02/13/2023: Continue 01/09/2023: Continue 12/28/2022: -22(34) 11/16/2022: -30(10)    Goal status: Ongoing      12. Pt. Will require donn/doff a Tank top T-shirt efficiently with supervision and full set-up.    Baseline: 05/31/2023: Pt.  Requires maxA donning a pullover sweatshirt, Mod-maxA doffing it. T-shirt tank TBD 05/10/23: Pt. Presents with difficulty completing, and requires increased time to complete    Goal status: New    13. Pt. Will will independently perform bilateral/bimanual hand tasks needed to fold towels/linens/laundry Baseline:05/31/23: Continue 05/10/23:  Pt. Has difficulty folding towels/lines/laundry using bilateral hands Goal status: New  14. Pt. Will independently, and efficiently reach his right arm over the side of the chair to pet his dog.    Baseline: 05/31/2023: Continue 05/10/23: Pt. Is unable to efficiently reach his right UE over the side of his recliner chair to pet his dog.    Goal status: New    15. Pt. Will independently, and efficiently reach out  in front of him to efficiently place items onto his table while sitting in his recliner.     Baseline: 2/26/225: Continue 05/10/23: Pt. has difficulty reaching out in front of him far enough to efficiently place items on the table while sitting in a recliner chair.    Goal status: New       ASSESSMENT:  CLINICAL IMPRESSION:  Pt. received Botox last week. Pt./caregiver report notable changes in hand function since the most recent Botox injections. Pt. presents with bilateral hand digit MP hyperextension, and PIP flexion with grasping attempts. PIP flexors, wrist flexors, and forearm pronators are more relaxed overall. Pt. Was able to achieve intermittent right 2nd digit isolated MP, PIP, and DIP extension in preparation for pressing the discs through the slot.  Pt. continues to be highly motivated to continue working towards improving BUE functioning, ROM, strength, motor control, [facility] skills, as well as visual compensatory strategies in order to maximize independence with daily ADLs, and IADL functioning.      PERFORMANCE DEFICITS in functional skills including ADLs, IADLs, coordination, dexterity, proprioception, ROM, strength, pain, FMC, GMC,  decreased knowledge of use of DME, and UE functional use, cognitive skills including safety awareness, and psychosocial skills including coping strategies, environmental adaptation, habits, and routines and behaviors.   IMPAIRMENTS are limiting patient from ADLs, IADLs, leisure, and social participation.   COMORBIDITIES may have co-morbidities  that affects occupational performance. Patient will benefit from skilled OT to address above impairments and improve overall function.  MODIFICATION OR ASSISTANCE TO COMPLETE EVALUATION: Maximum or significant modification of tasks or assist is necessary to complete an evaluation.  OT OCCUPATIONAL PROFILE AND HISTORY: Comprehensive assessment: Review of records and extensive additional review of physical, cognitive, psychosocial history related to current functional performance.  CLINICAL DECISION MAKING: High - multiple treatment options, significant modification of task necessary  REHAB POTENTIAL: Fair    EVALUATION COMPLEXITY: High    PLAN: OT FREQUENCY: 2 x's a week  OT DURATION:12 weeks  PLANNED INTERVENTIONS: self care/ADL training, therapeutic exercise, therapeutic activity, neuromuscular re-education, manual therapy, passive range of motion, patient/family education, and cognitive remediation/compensation RECOMMENDED OTHER SERVICES: PT  CONSULTED AND AGREED WITH PLAN OF CARE: Patient and family member/caregiver  PLAN FOR NEXT SESSION: Review HEP  Olegario Messier, MS, OTR/L  07/06/2023

## 2023-07-10 ENCOUNTER — Ambulatory Visit: Payer: Medicare Other | Admitting: Occupational Therapy

## 2023-07-10 ENCOUNTER — Ambulatory Visit: Payer: Medicare Other

## 2023-07-12 ENCOUNTER — Ambulatory Visit: Payer: Medicare Other

## 2023-07-12 ENCOUNTER — Ambulatory Visit: Payer: Medicare Other | Admitting: Occupational Therapy

## 2023-07-12 DIAGNOSIS — I6930 Unspecified sequelae of cerebral infarction: Secondary | ICD-10-CM | POA: Diagnosis not present

## 2023-07-12 DIAGNOSIS — M6281 Muscle weakness (generalized): Secondary | ICD-10-CM | POA: Diagnosis not present

## 2023-07-12 DIAGNOSIS — R278 Other lack of coordination: Secondary | ICD-10-CM

## 2023-07-12 DIAGNOSIS — R2689 Other abnormalities of gait and mobility: Secondary | ICD-10-CM

## 2023-07-12 DIAGNOSIS — R2681 Unsteadiness on feet: Secondary | ICD-10-CM

## 2023-07-12 DIAGNOSIS — R262 Difficulty in walking, not elsewhere classified: Secondary | ICD-10-CM

## 2023-07-12 DIAGNOSIS — R269 Unspecified abnormalities of gait and mobility: Secondary | ICD-10-CM

## 2023-07-13 NOTE — Therapy (Signed)
 OUTPATIENT PHYSICAL THERAPY TREATMENT     Patient Name: Paul Hall MRN: [id number] DOB:1995/09/09, 28 y.o., male Today's Date: 07/13/2023  PCP:  Cindi Carbon PROVIDER:  Lenise Herald, PA-C   PT End of Session - 07/13/23 2302     Visit Number 154    Number of Visits 160   date corrected from most recent recert   Date for PT Re-Evaluation 08/23/23    Authorization Type [employer] COMM Pro/ [place] Medicaid    Authorization Time Period 01/04/21-03/29/21; Recert 03/24/2021-06/16/2021; Recert 09/15/2021- 12/08/2021; Recert 12/13/2021-03/07/2022    Progress Note Due on Visit 160    PT Start Time 1616    PT Stop Time 1700    PT Time Calculation (min) 44 min    Equipment Utilized During Treatment Gait belt    Activity Tolerance Patient tolerated treatment well    Behavior During Therapy WFL for tasks assessed/performed                             Past Medical History:  Diagnosis Date   Diabetes mellitus (HCC)    Hypertension    Stroke (HCC)    Past Surgical History:  Procedure Laterality Date   IVC FILTER PLACEMENT ([facility] HX)     LEG SURGERY     PEG TUBE PLACEMENT     TRACHEOSTOMY     Patient Active Problem List   Diagnosis Date Noted   Sepsis due to vancomycin resistant Enterococcus species (HCC) 06/06/2019   SIRS (systemic inflammatory response syndrome) (HCC) 06/05/2019   Acute lower UTI 06/05/2019   VRE (vancomycin-resistant Enterococci) infection 06/05/2019   Anemia 06/05/2019   Skin ulcer of sacrum with necrosis of muscle (HCC)    Urinary retention    Type 2 diabetes mellitus without complication, with long-term current use of insulin (HCC)    Tachycardia    Lower extremity edema    Acute metabolic encephalopathy    Obstructive sleep apnea    Morbid obesity with BMI of 60.0-69.9, adult (HCC)    Goals of care, counseling/discussion    Palliative care encounter    Sepsis (HCC) 04/27/2019   H/O insulin dependent diabetes mellitus 04/27/2019   History  of CVA with residual deficit 04/27/2019   Seizure disorder (HCC) 04/27/2019   Decubitus ulcer of sacral region, stage 4 (HCC) 04/27/2019    REFERRING DIAG: Cerebral infarction, unspecified   THERAPY DIAG:  No diagnosis found.  Rationale for Evaluation and Treatment Rehabilitation  PERTINENT HISTORY: Paul Hall is a 26yoM who presents with severe weakness, quadriparesis, altered sensorium, and visual impairment s/p critical illness and prolonged hospitalization. Pt hospitalized in October 2020 with ARDS 2/2 COVID19 infection. Pt sustained a complex and lengthy hospitalization which included tracheostomy, prolonged sedation, ECMO. In this period pt sustained CVA and SDH. Pt has now been liberated from tracheostomy and G-tube. Pt has since been hospitalized for wound infection and UTI. Pt lives with parents at home, has hospital bed and left chair, hoyer lift transfers, and power WC for mobility needs. Pt needs heavy physical assistance with ADL 2/2 BUE contractures and motor dysfunction   PRECAUTIONS: Fall  SUBJECTIVE:  Patient reports no big changes or updates since last visit .  PAIN:  Are you having pain? No. But feeling tight.    TODAY'S TREATMENT:  - 06/26/2023     *Patient dependently hoyer transfer from his power chair to edge of mat with +2 assist (Dad +PT) and later same assist for  transfer back to w/c from mat (using patient provided hoyer pad) and then at end of visit transport via hoyer back from EOM back to power w/c  THERAPEUTIC ACTIVITIES:  - EOM- Static sitting with each hand touching edge of mat x several min (mod assist for RUE Due to tone).  -Progressed to some lateral weight shifting placing hand pressure onto each side of mat- 15 rep each side.  - anterior to posterior weight shifting at EOM x 20 reps. VC for "Nose over toes" - Ant weight shifting with dynamic UE reaching x 20 reps ea   -Seated hip march x 20 reps  -Seated knee ext x 15 reps ea LE - Seated  ankle ROM (DF into PF) x 20 reps each LE  - Static sit with cross body jabs- with VC and some physical assist with anterior weight shift x 15 reps each.                 PATIENT EDUCATION: Education details: Exercise technique with VC   HOME EXERCISE PROGRAM:  Access Code: [email or web address] URL: [email or web address] Date: 03/23/2022 Prepared by: Maureen Ralphs  Exercises - Supine Bridge  - 3 x weekly - 3 sets - 10 reps - 2 hold - Supine Gluteal Sets  - 3 x weekly - 3 sets - 10 reps - 5 sec hold - Supine Quad Set  - 1 x daily - 3 x weekly - 3 sets - 10 reps - 5 hold - Seated Long Arc Quad  - 1 x daily - 7 x weekly - 3 sets - 10 reps - Seated Hip Adduction Squeeze with Ball  - 1 x daily - 3 x weekly - 3 sets - 10 reps - 5 hold - Seated Hip Abduction  - 1 x daily - 3 x weekly - 3 sets - 10 reps - 2 hold    PT Short Term Goals -       PT SHORT TERM GOAL #1   Title Pt will be independent with HEP in order to improve strength and balance in order to decrease fall risk and improve function at home and work.    Baseline 01/04/2021= No formal HEP in place; 12/12 no HEP in place; 05/10/2021-Patient and his father were able to report compliance with curent HEP consisting of mostly seated/reclined LE strengthening. Both verbalize no questions at this time.    Time 6    Period Weeks    Status Achieved    Target Date 02/15/21            PT LONG TERM GOAL #1     Title Patient will increase BLE gross strength by 1/2 muscle grade to improve functional strength for improved independence with potential gait, increased standing tolerance and increased ADL ability.     Baseline 01/04/2021- Patient presents with 1/5 to 3-/5 B LE strength with MMT; 12/12: goal partially met for Left knee/hip; 05/10/2021= 2-/5 bilateal Hip flex; 3+/5 bilateral Knee ext; 06/21/2021= Patient presents with 2-/5 bilateral Hip flex; 3+/5 bilateral knee ext/flex; 2-/5 left ankle DF; 0/5 right ankle- and able to  increase reps and resistance with LE's. 09/15/2021- Patient technically presents with 2-/5 B hip flex/abd/add - but he is able to raise his hip up to approx 100 deg which has improved. 3+/5 Bilateral knee ext, 2-/5 left ankle and 0/5 right ankle.  12/08/2021= Patient able to lift left knee at 110 deg of hip flex; presents with 3+/5 knee ext, 2-/5 left ankle DF and  0/5 right ankle DF, 2-/5 bilateral Hip abd in seated position.    12/6: R: knee 3+/5 ext, 2/5 flexion, left knee 3+/5 extension, 3+/5 flexion, R hip: 2+/5 hip add, 2+/5 hip ABD L hip: 4-/5 hip ABD, 3+/5 hip ADD, 3+/5 hip flexion; 06/06/2022= Patient now presents with 2-/5 right ankle DF/PF;     Time 12     Period Weeks     Status MET    Target Date 03/07/2022         PT LONG TERM GOAL #2    Title Patient will tolerate sitting unsupported demonstrating erect sitting posture for 15 minutes with CGA to demonstrate improved back extensor strength and improved sitting tolerance.     Baseline 01/04/2021- Patient confied to sitting in lift chair or electric power chair with back support and unable to sit upright without physical assistance; 12/12: tolerates <1 minutes upright unsupported sitting. 05/10/2021=static sit with forward trunk lean  in his power wheelchair without back support x approx 3 min. 06/21/2021=Unable to assess today due to patient with acute back pain but on previous visit able to sit x 8 min without back support. 09/15/2021- on last visit- 09/13/2021- patient was able to sit unsupported x 8 min at edge of mat. 10/13/2023 - Patient was able to sit at edge of mat with varying level of assist today from SBA to min A for a total of 20 min. 12/13/2021= Patient demonstrated unsupported sitting at edge of mat for approx 20 min    Time 12     Period Weeks     Status GOAL MET    Target Date 12/08/21          PT LONG TERM GOAL #3    Title Patient will demonstrate ability to perform static standing in // bars > 2 min with Max Assist  without loss of  balance and fair posture for improved overall strength for pre-gait and transfer activities.     Baseline 01/04/2021= Patient current uanble to stand- Dependent on hoyer or sit to stand lift for transfers. 05/10/2021=Not appropriate yet- Currently still dependent with all transfers using hoyer. 06/21/2021= Patient continuing now to focus on LE strengthening to prepare for standing-unable to try today due to acute low back pain-  planning on attempting in new cert period. 09/15/2021- Patient has attempted standing 2x in past two week- max Assist of 2 people - only once was he successful to clearing his bottom from chair - Will continue to be a focus during the new certification. 12/13/2021= Patient has been limited secondary to increased overall low back pain during this certification and will require more time to focus on this goal.  12/6: not assessed this date, will assess at date when 2-3 PTs are present for assistance     Time 12     Period Weeks     Status GOAL not appropriate at this time - may attempt in future once Patient presents with improved overall LE strength.                 PT LONG TERM GOAL #4    Title Pt will improve FOTO score by 10 points or more demonstrating improved perceived functional ability     Baseline FOTO 7 on 10/17; 03/15/21: FOTO 12; 05/10/2021 06/21/2021= 1; 09/15/2021= 9; 12/13/2021= Will issue next visit 12/6: 4; 06/06/2022= Will assess next visit; Goal not appropriate as patient is not ambulatory.     Time 12     Period Weeks  Status WITHDRAWN    Target date 11/30/2022         PT LONG TERM GOAL #5    Title Patient will perform sit to stand transfer with appropriate AD and max assist of 2 people with 75% consistency to prepare for pregait activities.     Baseline 09/15/2021= Patient unable to stand well- unable to clear his bottom off chair with Max assist of 2 persons. 12/13/2021- Goal not appropriate to try yet but will keep and roll over to next cert as shift continues to  focus on transfers/standing; 4/24= Patient able to perform active ankle DF/PF with right LE and able to raise his knee into seated march and clear floor without physical assist today - as previously unable as well as lift right knee ext to near full ROM to improve strength for eventual standing. 09/07/2022- Goal continues to not be appropriate but patient is now standing some and will keep goal active; 11/23/2022= patient is now performing sit to stand with max A +2 and now able to clear his bottom off mat. 11/30/2022- Patient continues to perform sit to stand transfers with Max A+2 and able to clear bottom well off mat but not erect yet. 05/31/2023- Patient is able to stand consistently with max A +2 - unable to achieve full knee or trunk ext yet is able to clear bottom off mat for improved LE weightbearing.     Time 12     Period Weeks     Status PROGRESSING    Target date  08/23/2023       PT LONG TERM GOAL #6  Title Patient will tolerate sitting unsupported demonstrating erect sitting posture for 30 minutes with CGA to demonstrate improved back extensor strength and improved sitting tolerance.   Baseline 12/13/2021= Patient demonstrated unsupported sitting at edge of mat for approx 20 min;  12/29/2021- Patient performed approx 30 min of dynamic sitting activities today. 06/06/2022= Patient demonstrated ability to sit and perform static and dynamic UE/LE movement with only Supervision.   Time 12   Period Weeks   Status GOAL MET           7.  Patient will tolerate 2 minutes or more of standing in sit to stand lift or with max assist +2 in order to indicate improved lower extremity weightbearing tolerance for progression to standing in parallel bars. Baseline: 1 minute on most recent stand 02/21/22; 06/06/2022- Patient did attempt today after complaining of right LE pain last week. He attempted 3 stands using sit to stand lift. - all over 1 min- last one approx 48 sec- stopped due to fatigue. More erect  standing in lift today - still poor gluteal strength but able to activate glutes and extend hips upon command but unable to hold > 5 sec. 07/28/2022- Will attempt standing next visit but patient has been able to stand since last progress note for up to 3 min at a time at his best. 09/07/2022-  attempted standing with max +2 and patient able to stand 15-20 sec so will now  revise goal; 11/23/2022- Patient at his best between last 2 visits was able to stand approx 50 sec with max A+2.  11/30/2022- Patient performed 4 trials of static standing - max A +2- clearing bottom and using forearms to bear weight  03/15/2023: Patient performed 2 trials of static standing - max A +2- clearing edge of mat and using forearms to bear weight for approximately 45 seconds, is able to come into  approximately 45 degrees of knee flexion from sit to stand; 05/31/2023- Patient has not attempted standing in 1 month but able to perform x 4 trials with max A +2 (PT and his mom) and able to clear his bottom off mat and attempted to activate his glutes and apply some forearm pressure - able to stand between 25-40 sec today.  Goal status: PROGRESSING Target date: 08/23/2023   8. Patient will tolerate sitting unsupported demonstrating ability to perform dynamic UE/LE activities for 30 minutes  independently to demonstrate ability to sit at edge of bed to eat or perform some ADL's/exercise for optimal quality of life.             Baseline: 06/06/2022- Patient able to sit and perform some UE/LE exercises but requires CGA at times for safety- he is able to static sit for 30 min with supervision. 07/27/2022= Patient able to now sit > 30 min with Supervision only - performing static and dynamic activities. Will keep goal active to ensure patient is consistent. 09/07/2022=  Patient able to demonstrate consistent ability to sit at edge of mat during visits             Goal Status: MET             Target date: 08/29/2022        Plan     Clinical  Impression Statement  Patient presented with good motivation today and continued to focus on dynamic sitting. He continues to have some difficulty with anterior weight shift and required VC and repetitive activities designed to facilitate anterior weight for future ability to stand/transfer. He continues to demo improving right LE active muscle activation to prepare for future standing. Will resume standing next 1-2 weeks as appropriate. Consider use of steady for standing option.  Pt will continue to benefit from skilled physical therapy intervention to address impairments, improve QOL, and attain therapy goals.    Personal Factors and Comorbidities Comorbidity 3+;Time since onset of injury/illness/exacerbation    Comorbidities CVA, diabetes, Seizures    Examination-Activity Limitations Bathing;Bed Mobility;Bend;Caring for Others;Carry;Dressing;Hygiene/Grooming;Lift;Locomotion Level;Reach Overhead;Self Feeding;Sit;Squat;Stairs;Stand;Transfers;Toileting    Examination-Participation Restrictions Cleaning;Community Activity;Driving;Laundry;Medication Management;Meal Prep;Occupation;Personal Finances;Shop;Yard Work;Volunteer    Stability/Clinical Decision Making Evolving/Moderate complexity    Rehab Potential Fair    PT Frequency 2x / week    PT Duration 12 weeks    PT Treatment/Interventions ADLs/Self Care Home Management;Cryotherapy;Electrical Stimulation;Moist Heat;Ultrasound;DME Instruction;Gait training;Stair training;Functional mobility training;Therapeutic exercise;Balance training;Patient/family education;Orthotic Fit/Training;Neuromuscular re-education;Wheelchair mobility training;Manual techniques;Passive range of motion;Dry needling;Energy conservation;Taping;Visual/perceptual remediation/compensation;Joint Manipulations    PT Next Visit Plan core strength/motor control in short-sitting, sit to stand if able; Continue with progressive LE Strengthening. Stand activities as appropriate. Attempt Stedy  (sit to stand device) in future visits.   PT Home Exercise Plan No changes to HEP today    Consulted and Agree with Plan of Care Patient;Family member/caregiver    Family Member Consulted             03/09/2023, 8:04 AM      11:03 PM, 07/13/23 Lenda Kelp PT Physical Therapist - [facility]  11:03 PM 07/13/23

## 2023-07-17 ENCOUNTER — Ambulatory Visit: Payer: Medicare Other

## 2023-07-17 ENCOUNTER — Ambulatory Visit: Payer: Medicare Other | Admitting: Occupational Therapy

## 2023-07-19 ENCOUNTER — Ambulatory Visit: Payer: Medicare Other | Admitting: Occupational Therapy

## 2023-07-19 ENCOUNTER — Ambulatory Visit: Payer: Medicare Other

## 2023-07-19 DIAGNOSIS — I6389 Other cerebral infarction: Secondary | ICD-10-CM | POA: Diagnosis not present

## 2023-07-19 DIAGNOSIS — R32 Unspecified urinary incontinence: Secondary | ICD-10-CM | POA: Diagnosis not present

## 2023-07-19 DIAGNOSIS — L8994 Pressure ulcer of unspecified site, stage 4: Secondary | ICD-10-CM | POA: Diagnosis not present

## 2023-07-24 ENCOUNTER — Ambulatory Visit: Payer: Medicare Other | Admitting: Occupational Therapy

## 2023-07-24 ENCOUNTER — Ambulatory Visit: Payer: Medicare Other

## 2023-07-24 DIAGNOSIS — G4733 Obstructive sleep apnea (adult) (pediatric): Secondary | ICD-10-CM | POA: Diagnosis not present

## 2023-07-26 ENCOUNTER — Ambulatory Visit: Payer: Medicare Other | Admitting: Occupational Therapy

## 2023-07-26 ENCOUNTER — Ambulatory Visit: Payer: Medicare Other

## 2023-07-31 ENCOUNTER — Ambulatory Visit: Payer: Medicare Other

## 2023-07-31 ENCOUNTER — Ambulatory Visit: Payer: Medicare Other | Admitting: Occupational Therapy

## 2023-08-02 ENCOUNTER — Ambulatory Visit: Payer: Medicare Other

## 2023-08-02 ENCOUNTER — Ambulatory Visit: Payer: Medicare Other | Admitting: Occupational Therapy

## 2023-08-07 ENCOUNTER — Ambulatory Visit: Payer: Medicare Other | Attending: Physician Assistant

## 2023-08-07 ENCOUNTER — Ambulatory Visit: Payer: Medicare Other | Admitting: Occupational Therapy

## 2023-08-07 DIAGNOSIS — M6281 Muscle weakness (generalized): Secondary | ICD-10-CM | POA: Diagnosis not present

## 2023-08-07 DIAGNOSIS — I6930 Unspecified sequelae of cerebral infarction: Secondary | ICD-10-CM | POA: Insufficient documentation

## 2023-08-07 DIAGNOSIS — R262 Difficulty in walking, not elsewhere classified: Secondary | ICD-10-CM | POA: Diagnosis not present

## 2023-08-07 DIAGNOSIS — R2689 Other abnormalities of gait and mobility: Secondary | ICD-10-CM | POA: Diagnosis not present

## 2023-08-07 DIAGNOSIS — R2681 Unsteadiness on feet: Secondary | ICD-10-CM | POA: Insufficient documentation

## 2023-08-07 DIAGNOSIS — R278 Other lack of coordination: Secondary | ICD-10-CM | POA: Diagnosis not present

## 2023-08-07 DIAGNOSIS — R269 Unspecified abnormalities of gait and mobility: Secondary | ICD-10-CM | POA: Insufficient documentation

## 2023-08-07 NOTE — Therapy (Signed)
 Occupational Therapy Neuro Treatment/Recertification Note     Patient Name: Paul Hall MRN: [id number] DOB:08-Jan-1996, 28 y.o., male Today's Date: 08/07/2023  PCP: Dr. Lewayne Records REFERRING PROVIDER: Dr. Lewayne Records    OT End of Session - 08/07/23 1714     Visit Number [id number]    Number of Visits 156    Date for OT Re-Evaluation 10/30/23    OT Start Time 1530    OT Stop Time 1615    OT Time Calculation (min) 45 min    Equipment Utilized During Treatment tilt in space power wc    Activity Tolerance Patient tolerated treatment well    Behavior During Therapy WFL for tasks assessed/performed                        Past Medical History:  Diagnosis Date   Diabetes mellitus (HCC)    Hypertension    Stroke [facility])    Past Surgical History:  Procedure Laterality Date   IVC FILTER PLACEMENT ([facility] HX)     LEG SURGERY     PEG TUBE PLACEMENT     TRACHEOSTOMY     Patient Active Problem List   Diagnosis Date Noted   Sepsis due to vancomycin  resistant Enterococcus species (HCC) 06/06/2019   SIRS (systemic inflammatory response syndrome) (HCC) 06/05/2019   Acute lower UTI 06/05/2019   VRE (vancomycin -resistant Enterococci) infection 06/05/2019   Anemia 06/05/2019   Skin ulcer of sacrum with necrosis of muscle (HCC)    Urinary retention    Type 2 diabetes mellitus without complication, with long-term current use of insulin  (HCC)    Tachycardia    Lower extremity edema    Acute metabolic encephalopathy    Obstructive sleep apnea    Morbid obesity with BMI of 60.0-69.9, adult (HCC)    Goals of care, counseling/discussion    Palliative care encounter    Sepsis (HCC) 04/27/2019   H/O insulin  dependent diabetes mellitus 04/27/2019   History of CVA with residual deficit 04/27/2019   Seizure disorder (HCC) 04/27/2019   Decubitus ulcer of sacral region, stage 4 (HCC) 04/27/2019   ONSET DATE: 01/2019  REFERRING DIAG: CVA/COVID-19  THERAPY DIAG:  Muscle weakness  (generalized)  Rationale for Evaluation and Treatment Rehabilitation  SUBJECTIVE:   SUBJECTIVE STATEMENT:  Pt./caregiver reports they have officially moved into their new home.  Pt accompanied by: self and family member  PERTINENT HISTORY:  Pt. is a 28 y.o. male who was diagnosed with COVID-19, and CVA with resultant quadriplegia. Pt. Was then hospitalized with VRE UTI. PMHx includes: urinary retention, seizure disorder, obstructive sleep disorder, DM Type II, Morbid obesity.   PRECAUTIONS: None  WEIGHT BEARING RESTRICTIONS No  PAIN:  No reports of pain Are you having pain? Discomfort with right wrist, and digit PIP ROM 2/2 increased tightness.  FALLS: Has patient fallen in last 6 months? No  LIVING ENVIRONMENT: Lives with: lives with their family Lives in: House/apartment Stairs: No Level Entry Has following equipment at home: Wheelchair (power) and hoyer lift, sit to stand lift  PLOF: Independent  PATIENT GOALS: To be able to engage in more daily care tasks.  OBJECTIVE:   HAND DOMINANCE: Right  ADLs:  Transfers/ambulation related to ADLs: Eating: Pt. reports being able to hold standard utensils, and is starting to engage more in self-feeding tasks, hand to mouth patterns. Pt. Reports that he does as much as he can with the task, and family assists  with the remainder of the task. Grooming:  Pt. Is able to initiate holding an electric toothbrush, and brush his teeth. Family assists LB Dressing: Total Assist UE dressing: Pt. is now able to reach up to actively assist with grasping , and pulling his gown down. Toileting:  Total Assist Bathing: MaxA UB, Total assist LB Tub Shower transfers: N/A Equipment: See above    IADLs: Shopping: Relies on family to assist Light housekeeping: Total Assist Meal Prep: Total Assist Community mobility:   Medication management:  Total Assist  Financial management: N/A Handwriting: Not legible: Pt. Is able to hold a pen with the  left hand, and initiate marking the page. Pt.'s eye glasses were not available  MOBILITY STATUS:  Power w/c  POSTURE COMMENTS:  Pt. Requires position changes in his power w/c  ACTIVITY TOLERANCE: Activity tolerance:  Fair  FUNCTIONAL OUTCOME MEASURES: FOTO: 40  TR score: 45  05/25/2022:   FOTO: 50 TR score: 45  07/20/22: FOTO 55  03/08/23: 51  UPPER EXTREMITY ROM     Active ROM Left eval Left  03/21/2022 Left 05/25/2022 L  07/20/22 Left 07/25/2022 Left  08/15/2022 Left  09/26/2022 Left  11/16/2022 Left 12/28/2022 Left 03/13/23 Left 05/10/23  Shoulder flexion 119 123 125 130  136 138 130 142 144 144  Shoulder abduction 110 115 115 115  118 121 125 142 140 140  Shoulder adduction             Shoulder extension             Shoulder internal rotation             Shoulder external rotation             Elbow flexion 135(135) 145 145 145  145    145 145  Elbow extension -27(-18) -21(-20) -20(-16)  -25(-10) -23(-10) -21(-10) -20(-18) -20(-18) -20(-17) -17(-16)  Wrist flexion             Wrist extension 10(50) 19(50) 28(50)  30(50) 35(55) 36(55) 36(60) 40(60) 45(60) 45(60)  Wrist ulnar deviation             Wrist radial deviation             Wrist pronation             Wrist supination   Limited by flexor tone       10(15)   (Blank rows = not tested)    Active ROM Right eval Right 03/21/2022 Right 05/25/2022 R  07/20/22 Right  07/25/22 Right 08/15/2022 Right 09/26/2022 Right 11/16/2022 Right 12/28/2022 Right 03/13/23 Right 05/10/23  Shoulder flexion 106 scaption 105  Scaption 62 flexion 110 scaption 100  105 105 105 112 Scaption 123 Scaption 122  Scaption  Shoulder abduction 114 90 97 100  105 117 120 124 124 122  Shoulder adduction             Shoulder extension             Shoulder internal rotation             Shoulder external rotation             Elbow flexion 120(130) 145 145 140  144 140 142 148 148 145  Elbow extension -45(-35) -22(-35) -28(-22)  -32(-21)  -32(-28) -28(-26) -28(-28) -27(-26) -27(-40) -38(-35)  Wrist flexion             Wrist extension -30(10) -25(10) -10(30)  -10(20) -10(25) -20(40) -30(10) -22(34) -22(35) -32(22)  Wrist ulnar deviation  Wrist radial deviation             Wrist pronation             Wrist supination   Limited by flexor tone            12/28/2022: Thumb radial abduction: Right: 12(46), Left: 40(54)  Digits: Bilateral 2nd through 5th digit: MP Hyperextension with PIP, an DIP flexion     UPPER EXTREMITY MMT:     Left eval Left 03/21/2022 Left  05/25/2022 L 07/20/22 Left  08/15/2022 Left 09/26/2022 Left 11/16/2022 Left 12/28/2022 Left  03/13/2023 Left  05/10/23  Shoulder flexion 3-/5 3/5 3+/5 4+/5 4+/5 4+/5 4+/5 4+/5 4+/5 4+/5  Shoulder abduction 3-/5 3-/5 4-/5 4+/5 4+/5 4+5 4+/5 4+/5 4+/5 4+/5  Shoulder adduction            Shoulder extension            Shoulder internal rotation            Shoulder external rotation            Elbow flexion 3/5 3+/5 4-/5 4+/5 5/5 5/5 5/5 5/5 5/5 5/5  Elbow extension 2-/5 2/5  4+/5 4+/5 4+/5  4/5 4/5 4/5 through available range  Wrist flexion            Wrist extension 2/5 2+/5 3-/5  3-/5 3-/5 3-/5 3/5 3/5 3/5  Wrist ulnar deviation            Wrist radial deviation            Wrist pronation            Wrist supination            (Blank rows = not tested)  Right eval Right 03/21/2022 Right  05/25/2022 R 07/20/22 Right 08/15/2022 Right 09/26/2022 Right 11/16/2022 Right 12/28/2022 Right 03/13/2023 Right 05/10/23  Shoulder flexion 3-/5 scaption 3-/5 3-/5 3+/5 3+/5 3+/5 3/5 3/5 3/5 3+/5  Shoulder abduction 3-/5 3-/5 3-/5 4/5 4/5 4/5 4/5 4/5 4/5 4/-5  Shoulder adduction            Shoulder extension            Shoulder internal rotation            Shoulder external rotation            Elbow flexion 3/5 3/5 3+/5 4/5 4+/5 4+/5 4+/5 4+/5 4+/5 4+/5  Elbow extension 2-/5 2/5 3-/5 4+/5 4+/5 4+/5 4+/5 4-/5 4-/5 4-/5  Wrist flexion            Wrist extension  2-/5 2-/5 2/5 2/5 2/5 2/5 2/5 2/5 2/5 2/5  Wrist ulnar deviation            Wrist radial deviation            Wrist pronation            Wrist supination              HAND FUNCTION:  Grip strength: Right: 0 lbs; Left: 0 lbs and Lateral pinch: Right: 5 lbs, Left: 2 lbs  03/21/2022: Lateral pinch: Right: 3.5 lbs, Left: 2 lbs  07/20/22: Grip strength: Right: 3 lbs; Left: 4 lbs and Lateral pinch: Right: 5 lbs, Left: 3 lbs  08/15/2022:  Grip strength: Right: 0 lbs; Left: 5 lbs and Lateral pinch: Right: 2 lbs, Left: 4 lbs  09/26/2022  Grip strength: Right: 0 lbs; Left: 8 lbs and Lateral pinch: Right: 2 lbs, Left:  5 lbs  11/16/2022  Grip strength: Right: 0 lbs; Left: 8 lbs  9/25/20204  Grip strength: Right: 0 lbs; Left: 8 lbs  Grip strength: Right: 0 lbs; Left: 8 lbs   03/08/23:  Grip strength: Right: 0 lbs without PROM, 5 lbs after PROM, Left 8 lbs  05/10/23:  Grip strength: TBD  Bilateral digit PIP/DIP flexion contractures with MP hyperextension with attempts for AROM. Pt. is able to tolerate AROM to the bilateral digits at the initial evaluation however, has a history of pain in the digits.  COORDINATION: Eval: Pt. is unable to grasp 9-hole test pegs. Pt. is able to initiate grasping larger pegs, and is able to hold a pen in the left hand.  07/20/22: 2 min 36 seconds to remove 9 pegs from 9 hole peg test - cues to locate pegs 2/2 low vision. Pt. is able to initiate grasping larger pegs on R hand and is able to hold a pen in the left hand.  08/15/2022:    Vision, and sensation limiting accuracy of 9 hole peg test results. Pt. Was able to grasp and remove vertical pegs intermittently with cues.    05/10/23: TBD  SENSATION: Light touch: Impaired   EDEMA:  N/A  MUSCLE TONE: BUE flexor Spasticity  COGNITION Overall cognitive status: Continue to assess in functional context  VISION:   Subjective report: Pt. was not wearing glasses at the time of the initial eval.   Baseline vision: Vision is very limited. Wears glasses all the time Visual history: History of impaired vision following CVA. Pt. Has received treatment through the [facility] low vision rehabilitation program.   VISION ASSESSMENT: Impaired To be further assessed in functional context  PERCEPTION: Impaired   PRAXIS: Impaired: motor planning  OBSERVATIONS:  Pt reports being on Tramadol   TODAY'S TREATMENT    Therapeutic Ex.:  -Emphasis was placed on slow prolonged gentle passive stretching for RUE, forearm supination, bilateral wrist extension, wrist extension, bilateral digit MP, PIP, and DIP extension following moist heat, and left wrist flexion/extension, PIP/DIP flexion, and extension while heat was applied to the RUE. PROM was performed to decrease stiffness, and prepare the RUE for functional use. ROM was performed to the shoulder right elbow, forearm, wrist, and digits. PROM for bilateral digit MP, PIP, and DIP extension in preparation for placing them onto a flat surface        PATIENT EDUCATION: Education details: stretching, PROM, massage to the digits on the bilateral hands. Person educated: Patient and Parent Education method: Explanation Education comprehension: verbalized understanding  HOME EXERCISE PROGRAM:  Continue ongoing assessment, and continue to provide as needed.   GOALS: Goals reviewed with patient? Yes  SHORT TERM GOALS: Target date: 09/18/2023     To assess splint fit, and make appropriate adjustments to promote good skin integrity through the palmar surface of the bilateral hands.  Baseline: 05/25/22: Goal currently met, however ongoing as needs to assess splint fit arise. 03/23/2022: Pt. is wearing splints a couple of hours at night bilateral resting hand splints. 03/21/2022: Pt. is wearing splints a couple of hours at night bilateral resting hand splints. Goal status: Deferred   LONG TERM GOALS: Target date:10/30/2023      FOTO score will Improve by  2 points for Pt. perceived improvement with the assessment specific ADL/IADL tasks.  Baseline:  03/08/23: 51; 12/28/2022: FOTO score: 56; 11/16/2022: 52 09/27/2022: 51 07/20/2022: FOTO 55 05/25/2022: FOTO score: 50,  TR score: 45 Eval: FOTO score: 40,  TR score: 45  Goal status: Achieved   2.   Pt. will independently perform oral care for 100% of the task after complete set-up. Baseline: 05/10/23: Pt. Performs 90% of the task. Pt.'s mother assists with 10% of the task for thorough cleaning the hard to reach places way in the back of the oral cavity.03/08/23: not as much assist required proximally, but to adjust positioning of toothbrush; 02/13/23: Pt. Continues to require assist proximally 01/09/2023: Continue 12/28/2022:  Pt. Continues to complete 90% of the task with complete set-up, with visual cues, and occassional assist of the right elbow. 11/16/2022: Pt. Completes 90% of the task with complete set-up. 09/26/2022: Completes 90% of the task, proximal assist at times required at the elbow.  08/15/2022: Completes 90% of the task, proximal assist at times required at the elbow. 07/20/22: completes 90% of task, limited by shoulder flexion. 05/25/2022:  Pt. Is able to initiate and perform oral care for approximately 90% of the task. Complete set-up required. Assi needed only for the very back teeth. 03/23/2022: Pt. Is able to initiate and perform oral care for approximately 75% of the task. Pt. Requires assist at proximally at the elbow for through oral care. 03/21/2022: Pt. Is able to initiate and perform oral care for approximately 75% of the task. Pt. Requires assist at proximally at the elbow for through oral care. Eval: Pt. is able to initiate using an electric toothbrush. Pt. requires assist for set-up, and assist for thoroughness, and as he Pt. fatigues. Goal status:  Partially met, Goal discontinued  3.  Pt. Will be modified independence with self-feeding for 100% using a swivel spoon, and standard fork after  complete set-up Baseline: 05/10/2023: Pt. feeds self 95-100% of the meal. with occasional assist to reposition the utensil. 03/08/23: indep with cup with a handle and lid, assist to reposition eating utensils in hand;  02/13/23:  Pt. Is now able to consistently, and efficiently use a swivel spoon for eating cereal with the left hand.01/09/2023: Continue 12/28/2022: Pt. Is now feeding himself 90% of the time using a swivel spoon with the left hand, and 95% of the time with the fork. Pt. Continues to have difficulty with cutting food. 11/16/2022: 100% with finger foods, Pt. Is initiating with a swivel spoon, and universal cuff for fork use, however requires assist for consistency, and accuracy.  09/26/2022: 90% using the spoon, assist at time with the forearm motion, and grip on the utensils. 100% for finger foods.  08/15/2022:  90% using the spoon, assit at time with the forearm motion, and grip on the utensils. 100% for finger foods-independent with set-up- unsupervised.  07/20/22: self-feeds cereal using spoon 90% of task. 05/25/2022: Pt. Is able to use a spoon to scoop cereal when feeding himself cereal 85% of the time. Pt. Is able to feed himself snack/finger foods 100% of the time. Pt. Continues to work on consistency of  stabilizing a cup/mug when drinking. Pt. Is able to grasp a water  bottle with assist initially, with assist tapering off as he drinks.03/23/2022: Pt. Is able to perform scooping cereal for 75% of the time. Pt. required assist, and support at the left elbow, and Pt. Presents with limited forearm supination when using the spoon, and bringing it towards his mouth. Pt. Is able to use a fork to spear items, and perform the hand to mouth pattern.  03/21/2022: Pt. Is able to perform scooping cereal for 75% of the time. Pt. required assist, and support at the left elbow, and Pt. presents with limited  forearm supination when using the spoon, and bringing it towards his mouth. Pt. Is able to use a fork to spear  items, and perform the hand to mouth pattern.  Eval: Pt. is able to hold standard standard utensils. Pt. Performs as much of the task as he, can and has assistance for the remainder. Goal status:  Improved, Achieved  4.  Pt. will improve grasp patterns and consistently grasp 1/4" objects for ADL, and IADL tasks.  Baseline:08/07/23: Continue 05/31/2023: Continue 05/10/23: Pt. is consistently able to grasp 1/2" objects with visual cues. Pt. Requires assist, and increased visual cues to grasp 1/4" objects on an elevated plane.  Pt. Is unable to grasp the flat at the tabletop surface. Pt. Is grasping grasping small puppy vitamins.03/08/23: 1/2" objects efficiently; 02/13/2023: 1/2" objects efficiently 01/09/2023: Continue 12/28/2022: Continues to grasp 1/4" pegs with min a + visual cues, consistently grasping 1/2" objects with visual cues 11/16/2022: Continues to grasp 1/4" pegs with min a + visual cues, consistently grasping 1/2" objects with visual cues 09/26/2022: Continues to grasp 1/4" pegs with min a + visual cues, consistently grasping 1/2" objects with visual cues. 08/15/2022: grasps 1/4" pegs with min a + visual cues, consistently grasping 1/2" objects with visual cues. 07/20/22: grasps 1/4" pegs with min a + visual cues, consistently grasping 1/2" objects with visual cues. 05/25/2022: Pt. Is working on improving consistency of grasping 1/2" objects with visual cues.  03/23/2022: Pt. Is able to grasp 1" objects consistently,and continues to work on the hand patterns needed to grasp 1/2" objects.03/21/2022: Pt. Is able to grasp 1" objects consistently,and continues to work on the hand patterns needed to grasp 1/2" objects. Eval: Pt. is able to grasp 1" objects intermittently using a lateral grasp pattern. Goal status: Ongoing  5.  Pt. will independently write his name legibly with letter sizes under 1". Baseline: 05/10/23: Deferred 03/08/23: Not addressed this period; 02/13/2023: Continue 01/09/2023: Continue.  12/28/2022: Pt. is now using an IPad Pen. 11/16/2022: Pt. Continues to be able to write name with smaller letter size. Pt. Is signing name with a computer styus. Pt. Requires visual cues, and assist 09/26/2022: Pt. Continues to be able to write name with smaller letter size. Pt. Is signing name with a computer styus. Pt. Requires visual cues, and assist. 08/15/2022: Pt. Is able to write name with smaller letter size. Pt. Is signing name with a computer styus. Pt. Requires visual cues, and assist. 07/20/22: stabilizing assist to write name with 75% legibility with 2" letters. 05/25/2022: Pt. to continue to work towards formulating grasp patterns in preparation for grasping a large width pen. Pt. Requires visual cues. 03/23/2022: Pt. Is able to write his name with modA, however has difficulty with formulating letter sizes less than 2" in size with 50% legibility for the 3 letters of his name.03/21/2022: Pt. Is able to write his name with modA, however has difficulty with formulating letter sizes less than 2" in size with 50% legibility for the 3 letters of his name. Eval: Pt is able to hold a thin marker with his left hand, and formulate a line, and initiate a circular pattern (Pt. without glasses today) Goal status: Deferred  6. Pt. Will reach up to comb/brush his hair with minA.  Baseline: 05/10/23: Pt. is able to use a pick independently with the left hand with occasional assist to the far side of the left his head. 03/08/23: Pt uses pick in L hand to reach 75% of his head; unable to reach R  side of head; 02/13/2023:Pt. is able to use a hair pick with the left hand on the right side of his head, top, and back of the head. Pt. Continues to have difficulty reaching further to the right side of his head with the left. 01/09/2023: Continue 12/28/2022: Pt. Is progressing, and is now able to use a hair pick with the left hand on the right side of his head, top, and back of the head. Pt. Continues to have difficulty reaching  further to the right side of his head with the left. 11/16/2022: Pt. Is able to use a hair pick for the left side of his head using his left hand. Pt. Presents with difficulty reaching to the right side of his head.09/26/2022: Pt. Is able to use a hair pick for the left side of his head using his left hand. Pt. Presents with difficulty reaching to the right side of his head. 5/13/202: 75% using a hair pick, assist to the right side of the head. 07/20/22: reaches 75% of head, assist for far R side of head, fatigues quickly. 05/25/2022: Pt. Is able to reach up with the left hand to the left side, top, and back of his head. 03/23/2022: Pt. is now able to more consistently initiate reaching up to his head with his left hand in preparation for haircare12/18/2023: Pt. is now able to more consistently initiate reaching up to his head with his left hand in preparation for haircare. Eval: Pt. is able to initiate reaching up for hair care with a long handled brush, however is unable to sustain UE's in elevation to perform the task.     Goal status: Achieved  7. Pt. Will independently navigate the w/c through his environment with minA with visual scanning, and hand placement on the controls.  Baseline: 11/16/2022: Deferred 2/2 vision 09/26/2022: Pt. Is now navigating his w/c ouside the home, and around his block. 08/15/2022: Pt. Requires minA to setup hand on controls and MIN cues to navigate the w/c in wide spaces, requires MIN - MOD cues to navigate the w/c through more narrow doorways, and tighter turns - varies based on good vs bad vision days.07/20/22: Pt. Requires minA to setup hand on controls and MIN cues to navigate the w/c in wide spaces, requires MIN - MOD cues to navigate the w/c through more narrow doorways, and tighter turns - varies based on good vs bad vision days. 05/25/2022: Pt. Requires minA  and  cues to navigate the w/c in wide spaces, and requires Mod cues to navigate the w/c through more narrow doorways, and  tighter turns. Pt. Requires max cues for scanning through the environment, and moderate cues for hand placement on the controls.     Goal status: Deferred 2/2 vision    8. Pt. Will improve bilateral grip strength to be able to independently grasp, and pull up blankets, and linens.   Baseline: 05/10/23: Deferred 03/08/23: R grip 0-5 lbs, L grip 8 lbs; pt reports he can consistently pull up blankets and linens independently.  02/13/2023: Continue. Pt. presents with digit PIP, and DIP flexor tightness. 01/09/2023: Continue 12/28/2023: R: 0#, L: 5# Pt. Is now able to more consistently grasp and pull blankets up over him. 11/16/2022: R: 0 L: 8# Pt. Is more consistently grasping his blankets and attempting to pull them up. 09/26/2022: R: 0  L: 8# Pt. is attempting to grasp, and pull blankets up more, using mostly the left hand. 08/15/2022: Pt. has difficulty securely holding, and pulling up  blankets, and linens.    Goal status:Deferred  9. Pt. will consistently actively control the releasing of blankets, covers, and linens from his hands once they are in the desired position over him. Baseline:12/28/2022: Pt. Is consistently actively able to release linens once they are in the desired position over him. 11/16/2022: Pt. continues to improve with actively releasing blankets form his hands. 09/26/2022: Pt. is improving with actively releasing blankets form his hands. 08/15/2022: Pt. has difficulty with controlled releasing of blankets/linens from his grasp.     Goal status: Achieved    10. Pt. Will improve bilateral UE strength by 2 MM grades to assist with ADLs, and IADLs  Baseline: 08/07/23: Continue 05/31/2023: Continue 05/10/23: Right: shoulder flexion: 3+/5, abduction: 44-/5, elbow flexion: 4+/5 , extension: 4-/5 wrist extension: 2/5; Left: shoulder flexion: 4+/5, abduction: 4+/5, elbow flexion: 5/5 , extension: 4/5  wrist extension: 3/5 03/13/2023: Right: shoulder flexion: 3/5, abduction: 4/5, elbow flexion: 4+/5 ,  extension: 4-/5 wrist extension: 2/5; Left: shoulder flexion: 4+/5, abduction: 4+/5, elbow flexion: 5/5 , extension: 4/5  wrist extension: 3/5 03/08/23: NT this date d/t as pt arrived late; 02/13/2023: Continue 01/09/2023: Continue 12/28/2022: Right: shoulder flexion: 3/5, abduction: 4/5, elbow flexion: 4+/5 , extension: 4-/5 wrist extension: 2/5; Left: shoulder flexion: 4+/5, abduction: 4+/5, elbow flexion: 5/5 , extension: 4/5  wrist extension: 3/5 Right: shoulder flexion: 3+/5, abduction: 4/5, elbow flexion: 4+/5 , extension: 4+/5 wrist extension: 2/5; Left: shoulder flexion: 4+/5, abduction: 4+/5, elbow flexion: 5/5 , extension: 4+/5 wrist extension: 3-/5 11/16/2022: Right: shoulder flexion: 3/5, abduction: 4/5, elbow flexion: 4+/5 , extension: 4+/5 wrist extension: 2/5; Left: shoulder flexion: 4+/5, abduction: 4+/5, elbow flexion: 5/5 , extension:  wrist extension: 3-/5 Right: shoulder flexion: 3+/5, abduction: 4/5, elbow flexion: 4+/5 , extension: 4+/5 wrist extension: 2/5; Left: shoulder flexion: 4+/5, abduction: 4+/5, elbow flexion: 5/5 , extension: 4+/5 wrist extension: 3-/5 Goal status: Ongoing  11. Pt. will improve right wrist extension by 10 degrees to initiate reaching for items in preparation for ADLs. Baseline: 08/07/23: Continue 05/31/23: Continue 05/10/23: Right wrist extension: -32(22) 03/13/23: -22(35) 03/08/23: NT this date as pt arrived late; 02/13/2023: Continue 01/09/2023: Continue 12/28/2022: -22(34) 11/16/2022: -30(10)    Goal status: Ongoing      12. Pt. Will require donn/doff a Tank top T-shirt efficiently with supervision and full set-up.    Baseline: 08/07/23: Continue 05/31/2023: Pt. Requires maxA donning a pullover sweatshirt, Mod-maxA doffing it. T-shirt tank TBD 05/10/23: Pt. Presents with difficulty completing, and requires increased time to complete    Goal status: Ongoing    13. Pt. Will will independently perform bilateral/bimanual hand tasks needed to fold  towels/linens/laundry Baseline: 08/07/23: Continue 05/31/23: Continue 05/10/23:  Pt. Has difficulty folding towels/lines/laundry using bilateral hands Goal status:Ongoing  14. Pt. Will independently, and efficiently reach his right arm over the side of the chair to pet his dog.    Baseline: 08/07/23: Continue 05/31/2023: Continue 05/10/23: Pt. Is unable to efficiently reach his right UE over the side of his recliner chair to pet his dog.    Goal status: Ongoing    15. Pt. Will independently, and efficiently reach out in front of him to efficiently place items onto his table while sitting in his recliner.     Baseline: 08/07/23: Continue 2/26/225: Continue 05/10/23: Pt. has difficulty reaching out in front of him far enough to efficiently place items on the table while sitting in a recliner chair.    Goal status: Ongoing   ASSESSMENT:  CLINICAL IMPRESSION:  Pt. has had an multiple cancellations due the transportation, as well as an extended absence over this recertification period due to a family move into a new home.Pt. has had Botox injections over this past recertification period. Plan to continue with the current goals into this next recertification period to be able to focus on working towards the established, and upgraded goals, as the Pt. and caregiver have returned to the clinic, and are ready to resume therapy. Pt. continues to be highly motivated, and Pt.'s caregivers are very supportive to continue working towards improving BUE functioning, ROM, strength, motor control, [facility] skills, as well as visual compensatory strategies in order to maximize independence with daily ADLs, and IADL functioning.      PERFORMANCE DEFICITS in functional skills including ADLs, IADLs, coordination, dexterity, proprioception, ROM, strength, pain, FMC, GMC, decreased knowledge of use of DME, and UE functional use, cognitive skills including safety awareness, and psychosocial skills including coping strategies,  environmental adaptation, habits, and routines and behaviors.   IMPAIRMENTS are limiting patient from ADLs, IADLs, leisure, and social participation.   COMORBIDITIES may have co-morbidities  that affects occupational performance. Patient will benefit from skilled OT to address above impairments and improve overall function.  MODIFICATION OR ASSISTANCE TO COMPLETE EVALUATION: Maximum or significant modification of tasks or assist is necessary to complete an evaluation.  OT OCCUPATIONAL PROFILE AND HISTORY: Comprehensive assessment: Review of records and extensive additional review of physical, cognitive, psychosocial history related to current functional performance.  CLINICAL DECISION MAKING: High - multiple treatment options, significant modification of task necessary  REHAB POTENTIAL: Fair    EVALUATION COMPLEXITY: High    PLAN: OT FREQUENCY: 2 x's a week  OT DURATION:12 weeks  PLANNED INTERVENTIONS: self care/ADL training, therapeutic exercise, therapeutic activity, neuromuscular re-education, manual therapy, passive range of motion, patient/family education, and cognitive remediation/compensation RECOMMENDED OTHER SERVICES: PT  CONSULTED AND AGREED WITH PLAN OF CARE: Patient and family member/caregiver  PLAN FOR NEXT SESSION: Review HEP  Duey Ghent, MS, OTR/L  07/06/2023

## 2023-08-07 NOTE — Therapy (Signed)
 OUTPATIENT PHYSICAL THERAPY TREATMENT     Patient Name: Paul Hall MRN: [id number] DOB:18-Jun-1995, 28 y.o., male Today's Date: 08/08/2023  PCP:  Waunita Haff PROVIDER:  Susie Erichsen, PA-C   PT End of Session - 08/07/23 1611     Visit Number 155    Number of Visits 160   date corrected from most recent recert   Date for PT Re-Evaluation 08/23/23    Authorization Type [employer]/ [place] [employer]    Authorization Time Period 01/04/21-03/29/21; Recert 03/24/2021-06/16/2021; Recert 09/15/2021- 12/08/2021; Recert 12/13/2021-03/07/2022    Progress Note Due on Visit 160    PT Start Time 1615    PT Stop Time 1700    PT Time Calculation (min) 45 min    Equipment Utilized During Treatment Gait belt    Activity Tolerance Patient tolerated treatment well    Behavior During Therapy WFL for tasks assessed/performed                             Past Medical History:  Diagnosis Date   Diabetes mellitus (HCC)    Hypertension    Stroke (HCC)    Past Surgical History:  Procedure Laterality Date   IVC FILTER PLACEMENT ([facility] HX)     LEG SURGERY     PEG TUBE PLACEMENT     TRACHEOSTOMY     Patient Active Problem List   Diagnosis Date Noted   Sepsis due to vancomycin  resistant Enterococcus species (HCC) 06/06/2019   SIRS (systemic inflammatory response syndrome) (HCC) 06/05/2019   Acute lower UTI 06/05/2019   VRE (vancomycin -resistant Enterococci) infection 06/05/2019   Anemia 06/05/2019   Skin ulcer of sacrum with necrosis of muscle (HCC)    Urinary retention    Type 2 diabetes mellitus without complication, with long-term current use of insulin  (HCC)    Tachycardia    Lower extremity edema    Acute metabolic encephalopathy    Obstructive sleep apnea    Morbid obesity with BMI of 60.0-69.9, adult (HCC)    Goals of care, counseling/discussion    Palliative care encounter    Sepsis (HCC) 04/27/2019   H/O insulin  dependent diabetes mellitus 04/27/2019   History of  CVA with residual deficit 04/27/2019   Seizure disorder (HCC) 04/27/2019   Decubitus ulcer of sacral region, stage 4 (HCC) 04/27/2019    REFERRING DIAG: Cerebral infarction, unspecified   THERAPY DIAG:  Muscle weakness (generalized)  Other lack of coordination  Difficulty in walking, not elsewhere classified  Abnormality of gait and mobility  Unsteadiness on feet  Rationale for Evaluation and Treatment Rehabilitation  PERTINENT HISTORY: Paul Hall is a 26yoM who presents with severe weakness, quadriparesis, altered sensorium, and visual impairment s/p critical illness and prolonged hospitalization. Pt hospitalized in October 2020 with ARDS 2/2 COVID19 infection. Pt sustained a complex and lengthy hospitalization which included tracheostomy, prolonged sedation, ECMO. In this period pt sustained CVA and SDH. Pt has now been liberated from tracheostomy and G-tube. Pt has since been hospitalized for wound infection and UTI. Pt lives with parents at home, has hospital bed and left chair, hoyer lift transfers, and power WC for mobility needs. Pt needs heavy physical assistance with ADL 2/2 BUE contractures and motor dysfunction   PRECAUTIONS: Fall  SUBJECTIVE:  Patient reports been dealing with moving in to new house and trying to exercise- missed last 2 weeks and feeling tight requesting to stay in chair and stretch and work on LE strength.  Paul Hall  PAIN:  Are you having pain? No. But feeling tight.    TODAY'S TREATMENT:  - 08/07/2023    THEREX:  Passive stretching to each ankle, knee, and hip- flex/ext and rotation for each LE  x 20 min   LE strengthening: (in power chair today)    Ankle DF/PF- Active 2 sets of 12 reps each LE Seated quad sets with 5 sec hold- 2 sets of 10 reps Seated straight leg hip abd (Author holding heel to keep leg straight) - 2 sets of 12 reps each Seated Manual resistive hip add- Hold 5 sec 2 sets x 10 reps  Seated assisted heel slides 2 sets of 10 reps  each LE AROM knee ext - 2 sets of 12 reps with slow eccentric control and active                 PATIENT EDUCATION: Education details: Exercise technique with VC   HOME EXERCISE PROGRAM:  Access Code: [email or web address] URL: [email or web address] Date: 03/23/2022 Prepared by: Ferrell Hu  Exercises - Supine Bridge  - 3 x weekly - 3 sets - 10 reps - 2 hold - Supine Gluteal Sets  - 3 x weekly - 3 sets - 10 reps - 5 sec hold - Supine Quad Set  - 1 x daily - 3 x weekly - 3 sets - 10 reps - 5 hold - Seated Long Arc Quad  - 1 x daily - 7 x weekly - 3 sets - 10 reps - Seated Hip Adduction Squeeze with Ball  - 1 x daily - 3 x weekly - 3 sets - 10 reps - 5 hold - Seated Hip Abduction  - 1 x daily - 3 x weekly - 3 sets - 10 reps - 2 hold    PT Short Term Goals -       PT SHORT TERM GOAL #1   Title Pt will be independent with HEP in order to improve strength and balance in order to decrease fall risk and improve function at home and work.    Baseline 01/04/2021= No formal HEP in place; 12/12 no HEP in place; 05/10/2021-Patient and his father were able to report compliance with curent HEP consisting of mostly seated/reclined LE strengthening. Both verbalize no questions at this time.    Time 6    Period Weeks    Status Achieved    Target Date 02/15/21            PT LONG TERM GOAL #1     Title Patient will increase BLE gross strength by 1/2 muscle grade to improve functional strength for improved independence with potential gait, increased standing tolerance and increased ADL ability.     Baseline 01/04/2021- Patient presents with 1/5 to 3-/5 B LE strength with MMT; 12/12: goal partially met for Left knee/hip; 05/10/2021= 2-/5 bilateal Hip flex; 3+/5 bilateral Knee ext; 06/21/2021= Patient presents with 2-/5 bilateral Hip flex; 3+/5 bilateral knee ext/flex; 2-/5 left ankle DF; 0/5 right ankle- and able to increase reps and resistance with LE's. 09/15/2021- Patient technically  presents with 2-/5 B hip flex/abd/add - but he is able to raise his hip up to approx 100 deg which has improved. 3+/5 Bilateral knee ext, 2-/5 left ankle and 0/5 right ankle.  12/08/2021= Patient able to lift left knee at 110 deg of hip flex; presents with 3+/5 knee ext, 2-/5 left ankle DF and 0/5 right ankle DF, 2-/5 bilateral Hip abd in seated position.    12/6: R: knee 3+/5  ext, 2/5 flexion, left knee 3+/5 extension, 3+/5 flexion, R hip: 2+/5 hip add, 2+/5 hip ABD L hip: 4-/5 hip ABD, 3+/5 hip ADD, 3+/5 hip flexion; 06/06/2022= Patient now presents with 2-/5 right ankle DF/PF;     Time 12     Period Weeks     Status MET    Target Date 03/07/2022         PT LONG TERM GOAL #2    Title Patient will tolerate sitting unsupported demonstrating erect sitting posture for 15 minutes with CGA to demonstrate improved back extensor strength and improved sitting tolerance.     Baseline 01/04/2021- Patient confied to sitting in lift chair or electric power chair with back support and unable to sit upright without physical assistance; 12/12: tolerates <1 minutes upright unsupported sitting. 05/10/2021=static sit with forward trunk lean  in his power wheelchair without back support x approx 3 min. 06/21/2021=Unable to assess today due to patient with acute back pain but on previous visit able to sit x 8 min without back support. 09/15/2021- on last visit- 09/13/2021- patient was able to sit unsupported x 8 min at edge of mat. 10/13/2023 - Patient was able to sit at edge of mat with varying level of assist today from SBA to min A for a total of 20 min. 12/13/2021= Patient demonstrated unsupported sitting at edge of mat for approx 20 min    Time 12     Period Weeks     Status GOAL MET    Target Date 12/08/21          PT LONG TERM GOAL #3    Title Patient will demonstrate ability to perform static standing in // bars > 2 min with Max Assist  without loss of balance and fair posture for improved overall strength for pre-gait  and transfer activities.     Baseline 01/04/2021= Patient current uanble to stand- Dependent on hoyer or sit to stand lift for transfers. 05/10/2021=Not appropriate yet- Currently still dependent with all transfers using hoyer. 06/21/2021= Patient continuing now to focus on LE strengthening to prepare for standing-unable to try today due to acute low back pain-  planning on attempting in new cert period. 09/15/2021- Patient has attempted standing 2x in past two week- max Assist of 2 people - only once was he successful to clearing his bottom from chair - Will continue to be a focus during the new certification. 12/13/2021= Patient has been limited secondary to increased overall low back pain during this certification and will require more time to focus on this goal.  12/6: not assessed this date, will assess at date when 2-3 PTs are present for assistance     Time 12     Period Weeks     Status GOAL not appropriate at this time - may attempt in future once Patient presents with improved overall LE strength.                 PT LONG TERM GOAL #4    Title Pt will improve FOTO score by 10 points or more demonstrating improved perceived functional ability     Baseline FOTO 7 on 10/17; 03/15/21: FOTO 12; 05/10/2021 06/21/2021= 1; 09/15/2021= 9; 12/13/2021= Will issue next visit 12/6: 4; 06/06/2022= Will assess next visit; Goal not appropriate as patient is not ambulatory.     Time 12     Period Weeks     Status WITHDRAWN    Target date 11/30/2022  PT LONG TERM GOAL #5    Title Patient will perform sit to stand transfer with appropriate AD and max assist of 2 people with 75% consistency to prepare for pregait activities.     Baseline 09/15/2021= Patient unable to stand well- unable to clear his bottom off chair with Max assist of 2 persons. 12/13/2021- Goal not appropriate to try yet but will keep and roll over to next cert as shift continues to focus on transfers/standing; 4/24= Patient able to perform active  ankle DF/PF with right LE and able to raise his knee into seated march and clear floor without physical assist today - as previously unable as well as lift right knee ext to near full ROM to improve strength for eventual standing. 09/07/2022- Goal continues to not be appropriate but patient is now standing some and will keep goal active; 11/23/2022= patient is now performing sit to stand with max A +2 and now able to clear his bottom off mat. 11/30/2022- Patient continues to perform sit to stand transfers with Max A+2 and able to clear bottom well off mat but not erect yet. 05/31/2023- Patient is able to stand consistently with max A +2 - unable to achieve full knee or trunk ext yet is able to clear bottom off mat for improved LE weightbearing.     Time 12     Period Weeks     Status PROGRESSING    Target date  08/23/2023       PT LONG TERM GOAL #6  Title Patient will tolerate sitting unsupported demonstrating erect sitting posture for 30 minutes with CGA to demonstrate improved back extensor strength and improved sitting tolerance.   Baseline 12/13/2021= Patient demonstrated unsupported sitting at edge of mat for approx 20 min;  12/29/2021- Patient performed approx 30 min of dynamic sitting activities today. 06/06/2022= Patient demonstrated ability to sit and perform static and dynamic UE/LE movement with only Supervision.   Time 12   Period Weeks   Status GOAL MET           7.  Patient will tolerate 2 minutes or more of standing in sit to stand lift or with max assist +2 in order to indicate improved lower extremity weightbearing tolerance for progression to standing in parallel bars. Baseline: 1 minute on most recent stand 02/21/22; 06/06/2022- Patient did attempt today after complaining of right LE pain last week. He attempted 3 stands using sit to stand lift. - all over 1 min- last one approx 48 sec- stopped due to fatigue. More erect standing in lift today - still poor gluteal strength but able to  activate glutes and extend hips upon command but unable to hold > 5 sec. 07/28/2022- Will attempt standing next visit but patient has been able to stand since last progress note for up to 3 min at a time at his best. 09/07/2022-  attempted standing with max +2 and patient able to stand 15-20 sec so will now  revise goal; 11/23/2022- Patient at his best between last 2 visits was able to stand approx 50 sec with max A+2.  11/30/2022- Patient performed 4 trials of static standing - max A +2- clearing bottom and using forearms to bear weight  03/15/2023: Patient performed 2 trials of static standing - max A +2- clearing edge of mat and using forearms to bear weight for approximately 45 seconds, is able to come into approximately 45 degrees of knee flexion from sit to stand; 05/31/2023- Patient has not attempted standing  in 1 month but able to perform x 4 trials with max A +2 (PT and his mom) and able to clear his bottom off mat and attempted to activate his glutes and apply some forearm pressure - able to stand between 25-40 sec today.  Goal status: PROGRESSING Target date: 08/23/2023   8. Patient will tolerate sitting unsupported demonstrating ability to perform dynamic UE/LE activities for 30 minutes  independently to demonstrate ability to sit at edge of bed to eat or perform some ADL's/exercise for optimal quality of life.             Baseline: 06/06/2022- Patient able to sit and perform some UE/LE exercises but requires CGA at times for safety- he is able to static sit for 30 min with supervision. 07/27/2022= Patient able to now sit > 30 min with Supervision only - performing static and dynamic activities. Will keep goal active to ensure patient is consistent. 09/07/2022=  Patient able to demonstrate consistent ability to sit at edge of mat during visits             Goal Status: MET             Target date: 08/29/2022        Plan     Clinical Impression Statement  Patient returns today after missing 2+ weeks due  to dealing with moving into his new home with family. He presents with increased overall tightness in LE and benefited from prolonged stretching- Reporting feeling much better and then able to better participate in seated LE therex activities. He demonstrated good ankle ROM and ability to use RLE with various strengthening activities.  Pt will continue to benefit from skilled physical therapy intervention to address impairments, improve QOL, and attain therapy goals.    Personal Factors and Comorbidities Comorbidity 3+;Time since onset of injury/illness/exacerbation    Comorbidities CVA, diabetes, Seizures    Examination-Activity Limitations Bathing;Bed Mobility;Bend;Caring for Others;Carry;Dressing;Hygiene/Grooming;Lift;Locomotion Level;Reach Overhead;Self Feeding;Sit;Squat;Stairs;Stand;Transfers;Toileting    Examination-Participation Restrictions Cleaning;Community Activity;Driving;Laundry;Medication Management;Meal Prep;Occupation;Personal Finances;Shop;Yard Work;Volunteer    Stability/Clinical Decision Making Evolving/Moderate complexity    Rehab Potential Fair    PT Frequency 2x / week    PT Duration 12 weeks    PT Treatment/Interventions ADLs/Self Care Home Management;Cryotherapy;Electrical Stimulation;Moist Heat;Ultrasound;DME Instruction;Gait training;Stair training;Functional mobility training;Therapeutic exercise;Balance training;Patient/family education;Orthotic Fit/Training;Neuromuscular re-education;Wheelchair mobility training;Manual techniques;Passive range of motion;Dry needling;Energy conservation;Taping;Visual/perceptual remediation/compensation;Joint Manipulations    PT Next Visit Plan core strength/motor control in short-sitting, sit to stand if able; Continue with progressive LE Strengthening. Stand activities as appropriate. Attempt Stedy (sit to stand device) in future visits.   PT Home Exercise Plan No changes to HEP today    Consulted and Agree with Plan of Care Patient;Family  member/caregiver    Family Member Consulted             03/09/2023, 8:04 AM      9:32 AM, 08/08/23 M[employer]ene [employer] PT Physical Therapist - [facility]  9:32 AM 08/08/23

## 2023-08-09 ENCOUNTER — Ambulatory Visit: Payer: Medicare Other

## 2023-08-09 ENCOUNTER — Ambulatory Visit: Payer: Medicare Other | Admitting: Occupational Therapy

## 2023-08-14 ENCOUNTER — Ambulatory Visit: Payer: Medicare Other | Admitting: Occupational Therapy

## 2023-08-14 ENCOUNTER — Ambulatory Visit: Payer: Medicare Other

## 2023-08-14 DIAGNOSIS — R269 Unspecified abnormalities of gait and mobility: Secondary | ICD-10-CM

## 2023-08-14 DIAGNOSIS — R2681 Unsteadiness on feet: Secondary | ICD-10-CM | POA: Diagnosis not present

## 2023-08-14 DIAGNOSIS — I6930 Unspecified sequelae of cerebral infarction: Secondary | ICD-10-CM | POA: Diagnosis not present

## 2023-08-14 DIAGNOSIS — M6281 Muscle weakness (generalized): Secondary | ICD-10-CM | POA: Diagnosis not present

## 2023-08-14 DIAGNOSIS — R262 Difficulty in walking, not elsewhere classified: Secondary | ICD-10-CM

## 2023-08-14 DIAGNOSIS — R278 Other lack of coordination: Secondary | ICD-10-CM

## 2023-08-14 DIAGNOSIS — R2689 Other abnormalities of gait and mobility: Secondary | ICD-10-CM | POA: Diagnosis not present

## 2023-08-14 NOTE — Therapy (Signed)
 Occupational Therapy Neuro TreatmentNote     Patient Name: Paul Hall MRN: [id number] DOB:01/02/1996, 28 y.o., male Today's Date: 08/14/2023  PCP: Dr. Lewayne Records REFERRING PROVIDER: Dr. Lewayne Records    OT End of Session - 08/14/23 1735     Visit Number [id number]    Number of Visits 156    Date for OT Re-Evaluation 10/30/23    Authorization Type 09/26/2022    OT Start Time 1535    OT Stop Time 1615    OT Time Calculation (min) 40 min    Activity Tolerance Patient tolerated treatment well    Behavior During Therapy WFL for tasks assessed/performed                        Past Medical History:  Diagnosis Date   Diabetes mellitus (HCC)    Hypertension    Stroke [facility])    Past Surgical History:  Procedure Laterality Date   IVC FILTER PLACEMENT ([facility] HX)     LEG SURGERY     PEG TUBE PLACEMENT     TRACHEOSTOMY     Patient Active Problem List   Diagnosis Date Noted   Sepsis due to vancomycin  resistant Enterococcus species (HCC) 06/06/2019   SIRS (systemic inflammatory response syndrome) (HCC) 06/05/2019   Acute lower UTI 06/05/2019   VRE (vancomycin -resistant Enterococci) infection 06/05/2019   Anemia 06/05/2019   Skin ulcer of sacrum with necrosis of muscle (HCC)    Urinary retention    Type 2 diabetes mellitus without complication, with long-term current use of insulin  (HCC)    Tachycardia    Lower extremity edema    Acute metabolic encephalopathy    Obstructive sleep apnea    Morbid obesity with BMI of 60.0-69.9, adult (HCC)    Goals of care, counseling/discussion    Palliative care encounter    Sepsis (HCC) 04/27/2019   H/O insulin  dependent diabetes mellitus 04/27/2019   History of CVA with residual deficit 04/27/2019   Seizure disorder (HCC) 04/27/2019   Decubitus ulcer of sacral region, stage 4 (HCC) 04/27/2019   ONSET DATE: 01/2019  REFERRING DIAG: CVA/COVID-19  THERAPY DIAG:  Muscle weakness (generalized)  Rationale for Evaluation and Treatment  Rehabilitation  SUBJECTIVE:   SUBJECTIVE STATEMENT:  Pt./caregiver reports having had a party for Mother's Day, and a house warming.  Pt accompanied by: self and family member  PERTINENT HISTORY:  Pt. is a 28 y.o. male who was diagnosed with COVID-19, and CVA with resultant quadriplegia. Pt. Was then hospitalized with VRE UTI. PMHx includes: urinary retention, seizure disorder, obstructive sleep disorder, DM Type II, Morbid obesity.   PRECAUTIONS: None  WEIGHT BEARING RESTRICTIONS No  PAIN:  No reports of pain Are you having pain? No  FALLS: Has patient fallen in last 6 months? No  LIVING ENVIRONMENT: Lives with: lives with their family Lives in: House/apartment Stairs: No Level Entry Has following equipment at home: Wheelchair (power) and hoyer lift, sit to stand lift  PLOF: Independent  PATIENT GOALS: To be able to engage in more daily care tasks.  OBJECTIVE:   HAND DOMINANCE: Right  ADLs:  Transfers/ambulation related to ADLs: Eating: Pt. reports being able to hold standard utensils, and is starting to engage more in self-feeding tasks, hand to mouth patterns. Pt. Reports that he does as much as he can with the task, and family assists  with the remainder of the task. Grooming: Pt. Is able to initiate holding an electric toothbrush, and brush his teeth. Family  assists LB Dressing: Total Assist UE dressing: Pt. is now able to reach up to actively assist with grasping , and pulling his gown down. Toileting:  Total Assist Bathing: MaxA UB, Total assist LB Tub Shower transfers: N/A Equipment: See above    IADLs: Shopping: Relies on family to assist Light housekeeping: Total Assist Meal Prep: Total Assist Community mobility:   Medication management:  Total Assist  Financial management: N/A Handwriting: Not legible: Pt. Is able to hold a pen with the left hand, and initiate marking the page. Pt.'s eye glasses were not available  MOBILITY STATUS:  Power  w/c  POSTURE COMMENTS:  Pt. Requires position changes in his power w/c  ACTIVITY TOLERANCE: Activity tolerance:  Fair  FUNCTIONAL OUTCOME MEASURES: FOTO: 40 TR score: 45  05/25/2022:   FOTO: 50 TR score: 45  07/20/22: FOTO 55  03/08/23: 51  UPPER EXTREMITY ROM     Active ROM Left eval Left  03/21/2022 Left 05/25/2022 L  07/20/22 Left 07/25/2022 Left  08/15/2022 Left  09/26/2022 Left  11/16/2022 Left 12/28/2022 Left 03/13/23 Left 05/10/23  Shoulder flexion 119 123 125 130  136 138 130 142 144 144  Shoulder abduction 110 115 115 115  118 121 125 142 140 140  Shoulder adduction             Shoulder extension             Shoulder internal rotation             Shoulder external rotation             Elbow flexion 135(135) 145 145 145  145    145 145  Elbow extension -27(-18) -21(-20) -20(-16)  -25(-10) -23(-10) -21(-10) -20(-18) -20(-18) -20(-17) -17(-16)  Wrist flexion             Wrist extension 10(50) 19(50) 28(50)  30(50) 35(55) 36(55) 36(60) 40(60) 45(60) 45(60)  Wrist ulnar deviation             Wrist radial deviation             Wrist pronation             Wrist supination   Limited by flexor tone       10(15)   (Blank rows = not tested)    Active ROM Right eval Right 03/21/2022 Right 05/25/2022 R  07/20/22 Right  07/25/22 Right 08/15/2022 Right 09/26/2022 Right 11/16/2022 Right 12/28/2022 Right 03/13/23 Right 05/10/23  Shoulder flexion 106 scaption 105  Scaption 62 flexion 110 scaption 100  105 105 105 112 Scaption 123 Scaption 122  Scaption  Shoulder abduction 114 90 97 100  105 117 120 124 124 122  Shoulder adduction             Shoulder extension             Shoulder internal rotation             Shoulder external rotation             Elbow flexion 120(130) 145 145 140  144 140 142 148 148 145  Elbow extension -45(-35) -22(-35) -28(-22)  -32(-21) -32(-28) -28(-26) -28(-28) -27(-26) -27(-40) -38(-35)  Wrist flexion             Wrist extension -30(10)  -25(10) -10(30)  -10(20) -10(25) -20(40) -30(10) -22(34) -22(35) -32(22)  Wrist ulnar deviation             Wrist radial deviation  Wrist pronation             Wrist supination   Limited by flexor tone            12/28/2022: Thumb radial abduction: Right: 12(46), Left: 40(54)  Digits: Bilateral 2nd through 5th digit: MP Hyperextension with PIP, an DIP flexion     UPPER EXTREMITY MMT:     Left eval Left 03/21/2022 Left  05/25/2022 L 07/20/22 Left  08/15/2022 Left 09/26/2022 Left 11/16/2022 Left 12/28/2022 Left  03/13/2023 Left  05/10/23  Shoulder flexion 3-/5 3/5 3+/5 4+/5 4+/5 4+/5 4+/5 4+/5 4+/5 4+/5  Shoulder abduction 3-/5 3-/5 4-/5 4+/5 4+/5 4+5 4+/5 4+/5 4+/5 4+/5  Shoulder adduction            Shoulder extension            Shoulder internal rotation            Shoulder external rotation            Elbow flexion 3/5 3+/5 4-/5 4+/5 5/5 5/5 5/5 5/5 5/5 5/5  Elbow extension 2-/5 2/5  4+/5 4+/5 4+/5  4/5 4/5 4/5 through available range  Wrist flexion            Wrist extension 2/5 2+/5 3-/5  3-/5 3-/5 3-/5 3/5 3/5 3/5  Wrist ulnar deviation            Wrist radial deviation            Wrist pronation            Wrist supination            (Blank rows = not tested)  Right eval Right 03/21/2022 Right  05/25/2022 R 07/20/22 Right 08/15/2022 Right 09/26/2022 Right 11/16/2022 Right 12/28/2022 Right 03/13/2023 Right 05/10/23  Shoulder flexion 3-/5 scaption 3-/5 3-/5 3+/5 3+/5 3+/5 3/5 3/5 3/5 3+/5  Shoulder abduction 3-/5 3-/5 3-/5 4/5 4/5 4/5 4/5 4/5 4/5 4/-5  Shoulder adduction            Shoulder extension            Shoulder internal rotation            Shoulder external rotation            Elbow flexion 3/5 3/5 3+/5 4/5 4+/5 4+/5 4+/5 4+/5 4+/5 4+/5  Elbow extension 2-/5 2/5 3-/5 4+/5 4+/5 4+/5 4+/5 4-/5 4-/5 4-/5  Wrist flexion            Wrist extension 2-/5 2-/5 2/5 2/5 2/5 2/5 2/5 2/5 2/5 2/5  Wrist ulnar deviation            Wrist radial deviation             Wrist pronation            Wrist supination              HAND FUNCTION:  Grip strength: Right: 0 lbs; Left: 0 lbs and Lateral pinch: Right: 5 lbs, Left: 2 lbs  03/21/2022: Lateral pinch: Right: 3.5 lbs, Left: 2 lbs  07/20/22: Grip strength: Right: 3 lbs; Left: 4 lbs and Lateral pinch: Right: 5 lbs, Left: 3 lbs  08/15/2022:  Grip strength: Right: 0 lbs; Left: 5 lbs and Lateral pinch: Right: 2 lbs, Left: 4 lbs  09/26/2022  Grip strength: Right: 0 lbs; Left: 8 lbs and Lateral pinch: Right: 2 lbs, Left: 5 lbs  11/16/2022  Grip strength: Right: 0 lbs; Left: 8 lbs  9/25/20204  Grip strength: Right: 0 lbs; Left: 8 lbs  Grip strength: Right: 0 lbs; Left: 8 lbs   03/08/23:  Grip strength: Right: 0 lbs without PROM, 5 lbs after PROM, Left 8 lbs  05/10/23:  Grip strength: TBD  Bilateral digit PIP/DIP flexion contractures with MP hyperextension with attempts for AROM. Pt. is able to tolerate AROM to the bilateral digits at the initial evaluation however, has a history of pain in the digits.  COORDINATION: Eval: Pt. is unable to grasp 9-hole test pegs. Pt. is able to initiate grasping larger pegs, and is able to hold a pen in the left hand.  07/20/22: 2 min 36 seconds to remove 9 pegs from 9 hole peg test - cues to locate pegs 2/2 low vision. Pt. is able to initiate grasping larger pegs on R hand and is able to hold a pen in the left hand.  08/15/2022:    Vision, and sensation limiting accuracy of 9 hole peg test results. Pt. Was able to grasp and remove vertical pegs intermittently with cues.    05/10/23: TBD  SENSATION: Light touch: Impaired   EDEMA:  N/A  MUSCLE TONE: BUE flexor Spasticity  COGNITION Overall cognitive status: Continue to assess in functional context  VISION:   Subjective report: Pt. was not wearing glasses at the time of the initial eval.  Baseline vision: Vision is very limited. Wears glasses all the time Visual history: History of impaired vision  following CVA. Pt. Has received treatment through the [facility] low vision rehabilitation program.   VISION ASSESSMENT: Impaired To be further assessed in functional context  PERCEPTION: Impaired   PRAXIS: Impaired: motor planning  OBSERVATIONS:  Pt reports being on Tramadol   TODAY'S TREATMENT    Therapeutic Ex.:  -Emphasis was placed on slow prolonged gentle passive stretching for RUE, forearm supination, bilateral wrist extension, wrist extension, bilateral digit MP, PIP, and DIP extension following moist heat, and left wrist flexion/extension, PIP/DIP flexion, and extension while heat was applied to the RUE. PROM was performed to decrease stiffness, and prepare the RUE for functional use. ROM was performed to the shoulder right elbow, forearm, wrist, and digits. PROM for bilateral digit MP, PIP, and DIP extension in preparation for placing them onto a flat surface        PATIENT EDUCATION: Education details: stretching, PROM, massage to the digits on the bilateral hands. Person educated: Patient and Parent Education method: Explanation Education comprehension: verbalized understanding  HOME EXERCISE PROGRAM:  Continue ongoing assessment, and continue to provide as needed.   GOALS: Goals reviewed with patient? Yes  SHORT TERM GOALS: Target date: 09/18/2023     To assess splint fit, and make appropriate adjustments to promote good skin integrity through the palmar surface of the bilateral hands.  Baseline: 05/25/22: Goal currently met, however ongoing as needs to assess splint fit arise. 03/23/2022: Pt. is wearing splints a couple of hours at night bilateral resting hand splints. 03/21/2022: Pt. is wearing splints a couple of hours at night bilateral resting hand splints. Goal status: Deferred   LONG TERM GOALS: Target date:10/30/2023      FOTO score will Improve by 2 points for Pt. perceived improvement with the assessment specific ADL/IADL tasks.  Baseline:  03/08/23: 51;  12/28/2022: FOTO score: 56; 11/16/2022: 52 09/27/2022: 51 07/20/2022: FOTO 55 05/25/2022: FOTO score: 50,  TR score: 45 Eval: FOTO score: 40,  TR score: 45 Goal status: Achieved   2.   Pt. will independently perform oral care for 100%  of the task after complete set-up. Baseline: 05/10/23: Pt. Performs 90% of the task. Pt.'s mother assists with 10% of the task for thorough cleaning the hard to reach places way in the back of the oral cavity.03/08/23: not as much assist required proximally, but to adjust positioning of toothbrush; 02/13/23: Pt. Continues to require assist proximally 01/09/2023: Continue 12/28/2022:  Pt. Continues to complete 90% of the task with complete set-up, with visual cues, and occassional assist of the right elbow. 11/16/2022: Pt. Completes 90% of the task with complete set-up. 09/26/2022: Completes 90% of the task, proximal assist at times required at the elbow.  08/15/2022: Completes 90% of the task, proximal assist at times required at the elbow. 07/20/22: completes 90% of task, limited by shoulder flexion. 05/25/2022:  Pt. Is able to initiate and perform oral care for approximately 90% of the task. Complete set-up required. Assi needed only for the very back teeth. 03/23/2022: Pt. Is able to initiate and perform oral care for approximately 75% of the task. Pt. Requires assist at proximally at the elbow for through oral care. 03/21/2022: Pt. Is able to initiate and perform oral care for approximately 75% of the task. Pt. Requires assist at proximally at the elbow for through oral care. Eval: Pt. is able to initiate using an electric toothbrush. Pt. requires assist for set-up, and assist for thoroughness, and as he Pt. fatigues. Goal status:  Partially met, Goal discontinued  3.  Pt. Will be modified independence with self-feeding for 100% using a swivel spoon, and standard fork after complete set-up Baseline: 05/10/2023: Pt. feeds self 95-100% of the meal. with occasional assist to reposition the  utensil. 03/08/23: indep with cup with a handle and lid, assist to reposition eating utensils in hand;  02/13/23:  Pt. Is now able to consistently, and efficiently use a swivel spoon for eating cereal with the left hand.01/09/2023: Continue 12/28/2022: Pt. Is now feeding himself 90% of the time using a swivel spoon with the left hand, and 95% of the time with the fork. Pt. Continues to have difficulty with cutting food. 11/16/2022: 100% with finger foods, Pt. Is initiating with a swivel spoon, and universal cuff for fork use, however requires assist for consistency, and accuracy.  09/26/2022: 90% using the spoon, assist at time with the forearm motion, and grip on the utensils. 100% for finger foods.  08/15/2022:  90% using the spoon, assit at time with the forearm motion, and grip on the utensils. 100% for finger foods-independent with set-up- unsupervised.  07/20/22: self-feeds cereal using spoon 90% of task. 05/25/2022: Pt. Is able to use a spoon to scoop cereal when feeding himself cereal 85% of the time. Pt. Is able to feed himself snack/finger foods 100% of the time. Pt. Continues to work on consistency of  stabilizing a cup/mug when drinking. Pt. Is able to grasp a water  bottle with assist initially, with assist tapering off as he drinks.03/23/2022: Pt. Is able to perform scooping cereal for 75% of the time. Pt. required assist, and support at the left elbow, and Pt. Presents with limited forearm supination when using the spoon, and bringing it towards his mouth. Pt. Is able to use a fork to spear items, and perform the hand to mouth pattern.  03/21/2022: Pt. Is able to perform scooping cereal for 75% of the time. Pt. required assist, and support at the left elbow, and Pt. presents with limited forearm supination when using the spoon, and bringing it towards his mouth. Pt. Is able to  use a fork to spear items, and perform the hand to mouth pattern.  Eval: Pt. is able to hold standard standard utensils. Pt. Performs  as much of the task as he, can and has assistance for the remainder. Goal status:  Improved, Achieved  4.  Pt. will improve grasp patterns and consistently grasp 1/4" objects for ADL, and IADL tasks.  Baseline:08/07/23: Continue 05/31/2023: Continue 05/10/23: Pt. is consistently able to grasp 1/2" objects with visual cues. Pt. Requires assist, and increased visual cues to grasp 1/4" objects on an elevated plane.  Pt. Is unable to grasp the flat at the tabletop surface. Pt. Is grasping grasping small puppy vitamins.03/08/23: 1/2" objects efficiently; 02/13/2023: 1/2" objects efficiently 01/09/2023: Continue 12/28/2022: Continues to grasp 1/4" pegs with min a + visual cues, consistently grasping 1/2" objects with visual cues 11/16/2022: Continues to grasp 1/4" pegs with min a + visual cues, consistently grasping 1/2" objects with visual cues 09/26/2022: Continues to grasp 1/4" pegs with min a + visual cues, consistently grasping 1/2" objects with visual cues. 08/15/2022: grasps 1/4" pegs with min a + visual cues, consistently grasping 1/2" objects with visual cues. 07/20/22: grasps 1/4" pegs with min a + visual cues, consistently grasping 1/2" objects with visual cues. 05/25/2022: Pt. Is working on improving consistency of grasping 1/2" objects with visual cues.  03/23/2022: Pt. Is able to grasp 1" objects consistently,and continues to work on the hand patterns needed to grasp 1/2" objects.03/21/2022: Pt. Is able to grasp 1" objects consistently,and continues to work on the hand patterns needed to grasp 1/2" objects. Eval: Pt. is able to grasp 1" objects intermittently using a lateral grasp pattern. Goal status: Ongoing  5.  Pt. will independently write his name legibly with letter sizes under 1". Baseline: 05/10/23: Deferred 03/08/23: Not addressed this period; 02/13/2023: Continue 01/09/2023: Continue. 12/28/2022: Pt. is now using an IPad Pen. 11/16/2022: Pt. Continues to be able to write name with smaller letter size.  Pt. Is signing name with a computer styus. Pt. Requires visual cues, and assist 09/26/2022: Pt. Continues to be able to write name with smaller letter size. Pt. Is signing name with a computer styus. Pt. Requires visual cues, and assist. 08/15/2022: Pt. Is able to write name with smaller letter size. Pt. Is signing name with a computer styus. Pt. Requires visual cues, and assist. 07/20/22: stabilizing assist to write name with 75% legibility with 2" letters. 05/25/2022: Pt. to continue to work towards formulating grasp patterns in preparation for grasping a large width pen. Pt. Requires visual cues. 03/23/2022: Pt. Is able to write his name with modA, however has difficulty with formulating letter sizes less than 2" in size with 50% legibility for the 3 letters of his name.03/21/2022: Pt. Is able to write his name with modA, however has difficulty with formulating letter sizes less than 2" in size with 50% legibility for the 3 letters of his name. Eval: Pt is able to hold a thin marker with his left hand, and formulate a line, and initiate a circular pattern (Pt. without glasses today) Goal status: Deferred  6. Pt. Will reach up to comb/brush his hair with minA.  Baseline: 05/10/23: Pt. is able to use a pick independently with the left hand with occasional assist to the far side of the left his head. 03/08/23: Pt uses pick in L hand to reach 75% of his head; unable to reach R side of head; 02/13/2023:Pt. is able to use a hair pick with the left hand on  the right side of his head, top, and back of the head. Pt. Continues to have difficulty reaching further to the right side of his head with the left. 01/09/2023: Continue 12/28/2022: Pt. Is progressing, and is now able to use a hair pick with the left hand on the right side of his head, top, and back of the head. Pt. Continues to have difficulty reaching further to the right side of his head with the left. 11/16/2022: Pt. Is able to use a hair pick for the left side of  his head using his left hand. Pt. Presents with difficulty reaching to the right side of his head.09/26/2022: Pt. Is able to use a hair pick for the left side of his head using his left hand. Pt. Presents with difficulty reaching to the right side of his head. 5/13/202: 75% using a hair pick, assist to the right side of the head. 07/20/22: reaches 75% of head, assist for far R side of head, fatigues quickly. 05/25/2022: Pt. Is able to reach up with the left hand to the left side, top, and back of his head. 03/23/2022: Pt. is now able to more consistently initiate reaching up to his head with his left hand in preparation for haircare12/18/2023: Pt. is now able to more consistently initiate reaching up to his head with his left hand in preparation for haircare. Eval: Pt. is able to initiate reaching up for hair care with a long handled brush, however is unable to sustain UE's in elevation to perform the task.     Goal status: Achieved  7. Pt. Will independently navigate the w/c through his environment with minA with visual scanning, and hand placement on the controls.  Baseline: 11/16/2022: Deferred 2/2 vision 09/26/2022: Pt. Is now navigating his w/c ouside the home, and around his block. 08/15/2022: Pt. Requires minA to setup hand on controls and MIN cues to navigate the w/c in wide spaces, requires MIN - MOD cues to navigate the w/c through more narrow doorways, and tighter turns - varies based on good vs bad vision days.07/20/22: Pt. Requires minA to setup hand on controls and MIN cues to navigate the w/c in wide spaces, requires MIN - MOD cues to navigate the w/c through more narrow doorways, and tighter turns - varies based on good vs bad vision days. 05/25/2022: Pt. Requires minA  and  cues to navigate the w/c in wide spaces, and requires Mod cues to navigate the w/c through more narrow doorways, and tighter turns. Pt. Requires max cues for scanning through the environment, and moderate cues for hand placement on  the controls.     Goal status: Deferred 2/2 vision    8. Pt. Will improve bilateral grip strength to be able to independently grasp, and pull up blankets, and linens.   Baseline: 05/10/23: Deferred 03/08/23: R grip 0-5 lbs, L grip 8 lbs; pt reports he can consistently pull up blankets and linens independently.  02/13/2023: Continue. Pt. presents with digit PIP, and DIP flexor tightness. 01/09/2023: Continue 12/28/2023: R: 0#, L: 5# Pt. Is now able to more consistently grasp and pull blankets up over him. 11/16/2022: R: 0 L: 8# Pt. Is more consistently grasping his blankets and attempting to pull them up. 09/26/2022: R: 0  L: 8# Pt. is attempting to grasp, and pull blankets up more, using mostly the left hand. 08/15/2022: Pt. has difficulty securely holding, and pulling up blankets, and linens.    Goal status:Deferred  9. Pt. will consistently actively control the  releasing of blankets, covers, and linens from his hands once they are in the desired position over him. Baseline:12/28/2022: Pt. Is consistently actively able to release linens once they are in the desired position over him. 11/16/2022: Pt. continues to improve with actively releasing blankets form his hands. 09/26/2022: Pt. is improving with actively releasing blankets form his hands. 08/15/2022: Pt. has difficulty with controlled releasing of blankets/linens from his grasp.     Goal status: Achieved    10. Pt. Will improve bilateral UE strength by 2 MM grades to assist with ADLs, and IADLs  Baseline: 08/07/23: Continue 05/31/2023: Continue 05/10/23: Right: shoulder flexion: 3+/5, abduction: 44-/5, elbow flexion: 4+/5 , extension: 4-/5 wrist extension: 2/5; Left: shoulder flexion: 4+/5, abduction: 4+/5, elbow flexion: 5/5 , extension: 4/5  wrist extension: 3/5 03/13/2023: Right: shoulder flexion: 3/5, abduction: 4/5, elbow flexion: 4+/5 , extension: 4-/5 wrist extension: 2/5; Left: shoulder flexion: 4+/5, abduction: 4+/5, elbow flexion: 5/5 , extension:  4/5  wrist extension: 3/5 03/08/23: NT this date d/t as pt arrived late; 02/13/2023: Continue 01/09/2023: Continue 12/28/2022: Right: shoulder flexion: 3/5, abduction: 4/5, elbow flexion: 4+/5 , extension: 4-/5 wrist extension: 2/5; Left: shoulder flexion: 4+/5, abduction: 4+/5, elbow flexion: 5/5 , extension: 4/5  wrist extension: 3/5 Right: shoulder flexion: 3+/5, abduction: 4/5, elbow flexion: 4+/5 , extension: 4+/5 wrist extension: 2/5; Left: shoulder flexion: 4+/5, abduction: 4+/5, elbow flexion: 5/5 , extension: 4+/5 wrist extension: 3-/5 11/16/2022: Right: shoulder flexion: 3/5, abduction: 4/5, elbow flexion: 4+/5 , extension: 4+/5 wrist extension: 2/5; Left: shoulder flexion: 4+/5, abduction: 4+/5, elbow flexion: 5/5 , extension:  wrist extension: 3-/5 Right: shoulder flexion: 3+/5, abduction: 4/5, elbow flexion: 4+/5 , extension: 4+/5 wrist extension: 2/5; Left: shoulder flexion: 4+/5, abduction: 4+/5, elbow flexion: 5/5 , extension: 4+/5 wrist extension: 3-/5 Goal status: Ongoing  11. Pt. will improve right wrist extension by 10 degrees to initiate reaching for items in preparation for ADLs. Baseline: 08/07/23: Continue 05/31/23: Continue 05/10/23: Right wrist extension: -32(22) 03/13/23: -22(35) 03/08/23: NT this date as pt arrived late; 02/13/2023: Continue 01/09/2023: Continue 12/28/2022: -22(34) 11/16/2022: -30(10)    Goal status: Ongoing      12. Pt. Will require donn/doff a Tank top T-shirt efficiently with supervision and full set-up.    Baseline: 08/07/23: Continue 05/31/2023: Pt. Requires maxA donning a pullover sweatshirt, Mod-maxA doffing it. T-shirt tank TBD 05/10/23: Pt. Presents with difficulty completing, and requires increased time to complete    Goal status: Ongoing    13. Pt. Will will independently perform bilateral/bimanual hand tasks needed to fold towels/linens/laundry Baseline: 08/07/23: Continue 05/31/23: Continue 05/10/23:  Pt. Has difficulty folding towels/lines/laundry using  bilateral hands Goal status:Ongoing  14. Pt. Will independently, and efficiently reach his right arm over the side of the chair to pet his dog.    Baseline: 08/07/23: Continue 05/31/2023: Continue 05/10/23: Pt. Is unable to efficiently reach his right UE over the side of his recliner chair to pet his dog.    Goal status: Ongoing    15. Pt. Will independently, and efficiently reach out in front of him to efficiently place items onto his table while sitting in his recliner.     Baseline: 08/07/23: Continue 2/26/225: Continue 05/10/23: Pt. has difficulty reaching out in front of him far enough to efficiently place items on the table while sitting in a recliner chair.    Goal status: Ongoing   ASSESSMENT:  CLINICAL IMPRESSION:   Pt. Presents with tightness in the right forearm limiting PROM for right forearm supination, wrist  extension, and bilateral digit PIP, and DIP extension.  Pt. Tolerated ROM, and moist heat without reports of discomfort from tightness today. Pt. continues to be highly motivated, and Pt.'s caregivers are very supportive to continue working towards improving BUE functioning, ROM, strength, motor control, [facility] skills, as well as visual compensatory strategies in order to maximize independence with daily ADLs, and IADL functioning.      PERFORMANCE DEFICITS in functional skills including ADLs, IADLs, coordination, dexterity, proprioception, ROM, strength, pain, FMC, GMC, decreased knowledge of use of DME, and UE functional use, cognitive skills including safety awareness, and psychosocial skills including coping strategies, environmental adaptation, habits, and routines and behaviors.   IMPAIRMENTS are limiting patient from ADLs, IADLs, leisure, and social participation.   COMORBIDITIES may have co-morbidities  that affects occupational performance. Patient will benefit from skilled OT to address above impairments and improve overall function.  MODIFICATION OR ASSISTANCE TO  COMPLETE EVALUATION: Maximum or significant modification of tasks or assist is necessary to complete an evaluation.  OT OCCUPATIONAL PROFILE AND HISTORY: Comprehensive assessment: Review of records and extensive additional review of physical, cognitive, psychosocial history related to current functional performance.  CLINICAL DECISION MAKING: High - multiple treatment options, significant modification of task necessary  REHAB POTENTIAL: Fair    EVALUATION COMPLEXITY: High    PLAN: OT FREQUENCY: 2 x's a week  OT DURATION:12 weeks  PLANNED INTERVENTIONS: self care/ADL training, therapeutic exercise, therapeutic activity, neuromuscular re-education, manual therapy, passive range of motion, patient/family education, and cognitive remediation/compensation RECOMMENDED OTHER SERVICES: PT  CONSULTED AND AGREED WITH PLAN OF CARE: Patient and family member/caregiver  PLAN FOR NEXT SESSION: Review HEP  Duey [place], MS, OTR/L  08/14/23

## 2023-08-14 NOTE — Therapy (Signed)
 OUTPATIENT PHYSICAL THERAPY TREATMENT     Patient Name: Paul Hall MRN: [id number] DOB:1995/12/05, 28 y.o., male Today's Date: 08/15/2023  PCP:  Waunita Haff PROVIDER:  Susie Erichsen, PA-C   PT End of Session - 08/14/23 1621     Visit Number 156    Number of Visits 160   date corrected from most recent recert   Date for PT Re-Evaluation 08/23/23    Authorization Type [employer]/ [place] Medicaid    Authorization Time Period 01/04/21-03/29/21; Recert 03/24/2021-06/16/2021; Recert 09/15/2021- 12/08/2021; Recert 12/13/2021-03/07/2022    Progress Note Due on Visit 160    PT Start Time 1615    PT Stop Time 1655    PT Time Calculation (min) 40 min    Equipment Utilized During Treatment Gait belt    Activity Tolerance Patient tolerated treatment well    Behavior During Therapy WFL for tasks assessed/performed                             Past Medical History:  Diagnosis Date   Diabetes mellitus (HCC)    Hypertension    Stroke (HCC)    Past Surgical History:  Procedure Laterality Date   IVC FILTER PLACEMENT ([facility] HX)     LEG SURGERY     PEG TUBE PLACEMENT     TRACHEOSTOMY     Patient Active Problem List   Diagnosis Date Noted   Sepsis due to vancomycin  resistant Enterococcus species (HCC) 06/06/2019   SIRS (systemic inflammatory response syndrome) (HCC) 06/05/2019   Acute lower UTI 06/05/2019   VRE (vancomycin -resistant Enterococci) infection 06/05/2019   Anemia 06/05/2019   Skin ulcer of sacrum with necrosis of muscle (HCC)    Urinary retention    Type 2 diabetes mellitus without complication, with long-term current use of insulin  (HCC)    Tachycardia    Lower extremity edema    Acute metabolic encephalopathy    Obstructive sleep apnea    Morbid obesity with BMI of 60.0-69.9, adult (HCC)    Goals of care, counseling/discussion    Palliative care encounter    Sepsis (HCC) 04/27/2019   H/O insulin  dependent diabetes mellitus 04/27/2019   History  of CVA with residual deficit 04/27/2019   Seizure disorder (HCC) 04/27/2019   Decubitus ulcer of sacral region, stage 4 (HCC) 04/27/2019    REFERRING DIAG: Cerebral infarction, unspecified   THERAPY DIAG:  Muscle weakness (generalized)  Other lack of coordination  Difficulty in walking, not elsewhere classified  Abnormality of gait and mobility  Unsteadiness on feet  Rationale for Evaluation and Treatment Rehabilitation  PERTINENT HISTORY: Paul Hall is a 26yoM who presents with severe weakness, quadriparesis, altered sensorium, and visual impairment s/p critical illness and prolonged hospitalization. Pt hospitalized in October 2020 with ARDS 2/2 COVID19 infection. Pt sustained a complex and lengthy hospitalization which included tracheostomy, prolonged sedation, ECMO. In this period pt sustained CVA and SDH. Pt has now been liberated from tracheostomy and G-tube. Pt has since been hospitalized for wound infection and UTI. Pt lives with parents at home, has hospital bed and left chair, hoyer lift transfers, and power WC for mobility needs. Pt needs heavy physical assistance with ADL 2/2 BUE contractures and motor dysfunction   PRECAUTIONS: Fall  SUBJECTIVE:  Patient reports doing okay- States still very tight.  Paul Hall  PAIN:  Are you having pain? No. But feeling tight.    TODAY'S TREATMENT:  - 08/14/2023    THEREX: Performed  in chair Passive stretching to each ankle- DF/PF/Ankle Circles- CW/CCW x 20 , knee, and hip- flex/ext and rotation for each LE  x 20 min   LE strengthening: (in power chair today)    Ankle DF/PF- Active 2 sets of 12 reps each LE Attempted ankle circles - patient unable without extensive assistance. Seated quad sets with 5 sec hold- 2 sets of 10 reps Seated straight leg hip abd (Author holding heel to keep leg straight) - 2 sets of 12 reps each Seated Manual resistive hip add- Hold 5 sec 2 sets x 10 reps  Seated assisted heel slides 2 sets of 10 reps each  LE Seated SLR (AAROM) initially progressed from max assist to min assist with Left LE and max assist with RLE AROM knee ext - 2 sets of 12 reps with slow eccentric control and active                 PATIENT EDUCATION: Education details: Exercise technique with VC   HOME EXERCISE PROGRAM:  Access Code: [email or web address] URL: [email or web address] Date: 03/23/2022 Prepared by: Ferrell Hu  Exercises - Supine Bridge  - 3 x weekly - 3 sets - 10 reps - 2 hold - Supine Gluteal Sets  - 3 x weekly - 3 sets - 10 reps - 5 sec hold - Supine Quad Set  - 1 x daily - 3 x weekly - 3 sets - 10 reps - 5 hold - Seated Long Arc Quad  - 1 x daily - 7 x weekly - 3 sets - 10 reps - Seated Hip Adduction Squeeze with Ball  - 1 x daily - 3 x weekly - 3 sets - 10 reps - 5 hold - Seated Hip Abduction  - 1 x daily - 3 x weekly - 3 sets - 10 reps - 2 hold    PT Short Term Goals -       PT SHORT TERM GOAL #1   Title Pt will be independent with HEP in order to improve strength and balance in order to decrease fall risk and improve function at home and work.    Baseline 01/04/2021= No formal HEP in place; 12/12 no HEP in place; 05/10/2021-Patient and his father were able to report compliance with curent HEP consisting of mostly seated/reclined LE strengthening. Both verbalize no questions at this time.    Time 6    Period Weeks    Status Achieved    Target Date 02/15/21            PT LONG TERM GOAL #1     Title Patient will increase BLE gross strength by 1/2 muscle grade to improve functional strength for improved independence with potential gait, increased standing tolerance and increased ADL ability.     Baseline 01/04/2021- Patient presents with 1/5 to 3-/5 B LE strength with MMT; 12/12: goal partially met for Left knee/hip; 05/10/2021= 2-/5 bilateal Hip flex; 3+/5 bilateral Knee ext; 06/21/2021= Patient presents with 2-/5 bilateral Hip flex; 3+/5 bilateral knee ext/flex; 2-/5 left ankle  DF; 0/5 right ankle- and able to increase reps and resistance with LE's. 09/15/2021- Patient technically presents with 2-/5 B hip flex/abd/add - but he is able to raise his hip up to approx 100 deg which has improved. 3+/5 Bilateral knee ext, 2-/5 left ankle and 0/5 right ankle.  12/08/2021= Patient able to lift left knee at 110 deg of hip flex; presents with 3+/5 knee ext, 2-/5 left ankle DF and 0/5 right ankle DF, 2-/5  bilateral Hip abd in seated position.    12/6: R: knee 3+/5 ext, 2/5 flexion, left knee 3+/5 extension, 3+/5 flexion, R hip: 2+/5 hip add, 2+/5 hip ABD L hip: 4-/5 hip ABD, 3+/5 hip ADD, 3+/5 hip flexion; 06/06/2022= Patient now presents with 2-/5 right ankle DF/PF;     Time 12     Period Weeks     Status MET    Target Date 03/07/2022         PT LONG TERM GOAL #2    Title Patient will tolerate sitting unsupported demonstrating erect sitting posture for 15 minutes with CGA to demonstrate improved back extensor strength and improved sitting tolerance.     Baseline 01/04/2021- Patient confied to sitting in lift chair or electric power chair with back support and unable to sit upright without physical assistance; 12/12: tolerates <1 minutes upright unsupported sitting. 05/10/2021=static sit with forward trunk lean  in his power wheelchair without back support x approx 3 min. 06/21/2021=Unable to assess today due to patient with acute back pain but on previous visit able to sit x 8 min without back support. 09/15/2021- on last visit- 09/13/2021- patient was able to sit unsupported x 8 min at edge of mat. 10/13/2023 - Patient was able to sit at edge of mat with varying level of assist today from SBA to min A for a total of 20 min. 12/13/2021= Patient demonstrated unsupported sitting at edge of mat for approx 20 min    Time 12     Period Weeks     Status GOAL MET    Target Date 12/08/21          PT LONG TERM GOAL #3    Title Patient will demonstrate ability to perform static standing in // bars > 2 min  with Max Assist  without loss of balance and fair posture for improved overall strength for pre-gait and transfer activities.     Baseline 01/04/2021= Patient current uanble to stand- Dependent on hoyer or sit to stand lift for transfers. 05/10/2021=Not appropriate yet- Currently still dependent with all transfers using hoyer. 06/21/2021= Patient continuing now to focus on LE strengthening to prepare for standing-unable to try today due to acute low back pain-  planning on attempting in new cert period. 09/15/2021- Patient has attempted standing 2x in past two week- max Assist of 2 people - only once was he successful to clearing his bottom from chair - Will continue to be a focus during the new certification. 12/13/2021= Patient has been limited secondary to increased overall low back pain during this certification and will require more time to focus on this goal.  12/6: not assessed this date, will assess at date when 2-3 PTs are present for assistance     Time 12     Period Weeks     Status GOAL not appropriate at this time - may attempt in future once Patient presents with improved overall LE strength.                 PT LONG TERM GOAL #4    Title Pt will improve FOTO score by 10 points or more demonstrating improved perceived functional ability     Baseline FOTO 7 on 10/17; 03/15/21: FOTO 12; 05/10/2021 06/21/2021= 1; 09/15/2021= 9; 12/13/2021= Will issue next visit 12/6: 4; 06/06/2022= Will assess next visit; Goal not appropriate as patient is not ambulatory.     Time 12     Period Weeks     Status  WITHDRAWN    Target date 11/30/2022         PT LONG TERM GOAL #5    Title Patient will perform sit to stand transfer with appropriate AD and max assist of 2 people with 75% consistency to prepare for pregait activities.     Baseline 09/15/2021= Patient unable to stand well- unable to clear his bottom off chair with Max assist of 2 persons. 12/13/2021- Goal not appropriate to try yet but will keep and roll over to  next cert as shift continues to focus on transfers/standing; 4/24= Patient able to perform active ankle DF/PF with right LE and able to raise his knee into seated march and clear floor without physical assist today - as previously unable as well as lift right knee ext to near full ROM to improve strength for eventual standing. 09/07/2022- Goal continues to not be appropriate but patient is now standing some and will keep goal active; 11/23/2022= patient is now performing sit to stand with max A +2 and now able to clear his bottom off mat. 11/30/2022- Patient continues to perform sit to stand transfers with Max A+2 and able to clear bottom well off mat but not erect yet. 05/31/2023- Patient is able to stand consistently with max A +2 - unable to achieve full knee or trunk ext yet is able to clear bottom off mat for improved LE weightbearing.     Time 12     Period Weeks     Status PROGRESSING    Target date  08/23/2023       PT LONG TERM GOAL #6  Title Patient will tolerate sitting unsupported demonstrating erect sitting posture for 30 minutes with CGA to demonstrate improved back extensor strength and improved sitting tolerance.   Baseline 12/13/2021= Patient demonstrated unsupported sitting at edge of mat for approx 20 min;  12/29/2021- Patient performed approx 30 min of dynamic sitting activities today. 06/06/2022= Patient demonstrated ability to sit and perform static and dynamic UE/LE movement with only Supervision.   Time 12   Period Weeks   Status GOAL MET           7.  Patient will tolerate 2 minutes or more of standing in sit to stand lift or with max assist +2 in order to indicate improved lower extremity weightbearing tolerance for progression to standing in parallel bars. Baseline: 1 minute on most recent stand 02/21/22; 06/06/2022- Patient did attempt today after complaining of right LE pain last week. He attempted 3 stands using sit to stand lift. - all over 1 min- last one approx 48 sec-  stopped due to fatigue. More erect standing in lift today - still poor gluteal strength but able to activate glutes and extend hips upon command but unable to hold > 5 sec. 07/28/2022- Will attempt standing next visit but patient has been able to stand since last progress note for up to 3 min at a time at his best. 09/07/2022-  attempted standing with max +2 and patient able to stand 15-20 sec so will now  revise goal; 11/23/2022- Patient at his best between last 2 visits was able to stand approx 50 sec with max A+2.  11/30/2022- Patient performed 4 trials of static standing - max A +2- clearing bottom and using forearms to bear weight  03/15/2023: Patient performed 2 trials of static standing - max A +2- clearing edge of mat and using forearms to bear weight for approximately 45 seconds, is able to come into approximately  45 degrees of knee flexion from sit to stand; 05/31/2023- Patient has not attempted standing in 1 month but able to perform x 4 trials with max A +2 (PT and his mom) and able to clear his bottom off mat and attempted to activate his glutes and apply some forearm pressure - able to stand between 25-40 sec today.  Goal status: PROGRESSING Target date: 08/23/2023   8. Patient will tolerate sitting unsupported demonstrating ability to perform dynamic UE/LE activities for 30 minutes  independently to demonstrate ability to sit at edge of bed to eat or perform some ADL's/exercise for optimal quality of life.             Baseline: 06/06/2022- Patient able to sit and perform some UE/LE exercises but requires CGA at times for safety- he is able to static sit for 30 min with supervision. 07/27/2022= Patient able to now sit > 30 min with Supervision only - performing static and dynamic activities. Will keep goal active to ensure patient is consistent. 09/07/2022=  Patient able to demonstrate consistent ability to sit at edge of mat during visits             Goal Status: MET             Target date:  08/29/2022        Plan     Clinical Impression Statement  Treatment continues to work on ROM and LE strengthening to prepare patient for future standing activities. He presents with ongoing tightness RLE worse than LLE yet was able to stretch some and then follow up with more ROM with strengthening activities today.  Pt will continue to benefit from skilled physical therapy intervention to address impairments, improve QOL, and attain therapy goals.    Personal Factors and Comorbidities Comorbidity 3+;Time since onset of injury/illness/exacerbation    Comorbidities CVA, diabetes, Seizures    Examination-Activity Limitations Bathing;Bed Mobility;Bend;Caring for Others;Carry;Dressing;Hygiene/Grooming;Lift;Locomotion Level;Reach Overhead;Self Feeding;Sit;Squat;Stairs;Stand;Transfers;Toileting    Examination-Participation Restrictions Cleaning;Community Activity;Driving;Laundry;Medication Management;Meal Prep;Occupation;Personal Finances;Shop;Yard Work;Volunteer    Stability/Clinical Decision Making Evolving/Moderate complexity    Rehab Potential Fair    PT Frequency 2x / week    PT Duration 12 weeks    PT Treatment/Interventions ADLs/Self Care Home Management;Cryotherapy;Electrical Stimulation;Moist Heat;Ultrasound;DME Instruction;Gait training;Stair training;Functional mobility training;Therapeutic exercise;Balance training;Patient/family education;Orthotic Fit/Training;Neuromuscular re-education;Wheelchair mobility training;Manual techniques;Passive range of motion;Dry needling;Energy conservation;Taping;Visual/perceptual remediation/compensation;Joint Manipulations    PT Next Visit Plan core strength/motor control in short-sitting, sit to stand if able; Continue with progressive LE Strengthening. Stand activities as appropriate. Attempt Stedy (sit to stand device) in future visits.   PT Home Exercise Plan No changes to HEP today    Consulted and Agree with Plan of Care Patient;Family  member/caregiver    Family Member Consulted             03/09/2023, 8:04 AM      11:14 AM, 08/15/23 Murlene Army PT Physical Therapist - [facility]  11:14 AM 08/15/23

## 2023-08-16 ENCOUNTER — Ambulatory Visit: Payer: Medicare Other

## 2023-08-16 ENCOUNTER — Ambulatory Visit: Payer: Medicare Other | Admitting: Occupational Therapy

## 2023-08-16 ENCOUNTER — Ambulatory Visit: Admitting: Occupational Therapy

## 2023-08-16 DIAGNOSIS — R2681 Unsteadiness on feet: Secondary | ICD-10-CM

## 2023-08-16 DIAGNOSIS — M6281 Muscle weakness (generalized): Secondary | ICD-10-CM

## 2023-08-16 DIAGNOSIS — R262 Difficulty in walking, not elsewhere classified: Secondary | ICD-10-CM

## 2023-08-16 DIAGNOSIS — R278 Other lack of coordination: Secondary | ICD-10-CM | POA: Diagnosis not present

## 2023-08-16 DIAGNOSIS — R269 Unspecified abnormalities of gait and mobility: Secondary | ICD-10-CM | POA: Diagnosis not present

## 2023-08-16 DIAGNOSIS — I6930 Unspecified sequelae of cerebral infarction: Secondary | ICD-10-CM | POA: Diagnosis not present

## 2023-08-16 DIAGNOSIS — R2689 Other abnormalities of gait and mobility: Secondary | ICD-10-CM | POA: Diagnosis not present

## 2023-08-17 DIAGNOSIS — I6389 Other cerebral infarction: Secondary | ICD-10-CM | POA: Diagnosis not present

## 2023-08-17 DIAGNOSIS — R32 Unspecified urinary incontinence: Secondary | ICD-10-CM | POA: Diagnosis not present

## 2023-08-17 DIAGNOSIS — L8994 Pressure ulcer of unspecified site, stage 4: Secondary | ICD-10-CM | POA: Diagnosis not present

## 2023-08-17 NOTE — Therapy (Signed)
 OUTPATIENT PHYSICAL THERAPY TREATMENT     Patient Name: Paul Hall MRN: [id number] DOB:Aug 12, 1995, 28 y.o., male Today's Date: 08/17/2023  PCP:  Waunita Haff PROVIDER:  Susie Erichsen, PA-C   PT End of Session - 08/16/23 1709     Visit Number 157    Number of Visits 160   date corrected from most recent recert   Date for PT Re-Evaluation 08/23/23    Authorization Type [employer] [place] [employer]    Authorization Time Period 01/04/21-03/29/21; Recert 03/24/2021-06/16/2021; Recert 09/15/2021- 12/08/2021; Recert 12/13/2021-03/07/2022    Progress Note Due on Visit 160    PT Start Time 1633    PT Stop Time 1700    PT Time Calculation (min) 27 min    Equipment Utilized During Treatment Gait belt    Activity Tolerance Patient tolerated treatment well    Behavior During Therapy WFL for tasks assessed/performed                             Past Medical History:  Diagnosis Date   Diabetes mellitus (HCC)    Hypertension    Stroke (HCC)    Past Surgical History:  Procedure Laterality Date   IVC FILTER PLACEMENT ([facility] HX)     LEG SURGERY     PEG TUBE PLACEMENT     TRACHEOSTOMY     Patient Active Problem List   Diagnosis Date Noted   Sepsis due to vancomycin  resistant Enterococcus species (HCC) 06/06/2019   SIRS (systemic inflammatory response syndrome) (HCC) 06/05/2019   Acute lower UTI 06/05/2019   VRE (vancomycin -resistant Enterococci) infection 06/05/2019   Anemia 06/05/2019   Skin ulcer of sacrum with necrosis of muscle (HCC)    Urinary retention    Type 2 diabetes mellitus without complication, with long-term current use of insulin  (HCC)    Tachycardia    Lower extremity edema    Acute metabolic encephalopathy    Obstructive sleep apnea    Morbid obesity with BMI of 60.0-69.9, adult (HCC)    Goals of care, counseling/discussion    Palliative care encounter    Sepsis (HCC) 04/27/2019   H/O insulin  dependent diabetes mellitus 04/27/2019   History  of CVA with residual deficit 04/27/2019   Seizure disorder (HCC) 04/27/2019   Decubitus ulcer of sacral region, stage 4 (HCC) 04/27/2019    REFERRING DIAG: Cerebral infarction, unspecified   THERAPY DIAG:  Muscle weakness (generalized)  Other lack of coordination  Difficulty in walking, not elsewhere classified  Abnormality of gait and mobility  Unsteadiness on feet  Rationale for Evaluation and Treatment Rehabilitation  PERTINENT HISTORY: Paul Hall is a 26yoM who presents with severe weakness, quadriparesis, altered sensorium, and visual impairment s/p critical illness and prolonged hospitalization. Pt hospitalized in October 2020 with ARDS 2/2 COVID19 infection. Pt sustained a complex and lengthy hospitalization which included tracheostomy, prolonged sedation, ECMO. In this period pt sustained CVA and SDH. Pt has now been liberated from tracheostomy and G-tube. Pt has since been hospitalized for wound infection and UTI. Pt lives with parents at home, has hospital bed and left chair, hoyer lift transfers, and power WC for mobility needs. Pt needs heavy physical assistance with ADL 2/2 BUE contractures and motor dysfunction   PRECAUTIONS: Fall  SUBJECTIVE:  Patient reports having a rough mental/emotional day. He reports upset about transportation being late and missing his OT session and late for PT. Reports still really anxious and tight today. He was agreeable to  some stretching.  [name]  PAIN:  Are you having pain? No. But feeling tight.    TODAY'S TREATMENT:  - 08/16/2023    THEREX: Performed in chair Passive stretching to each ankle- DF/PF/Ankle Circles- CW/CCW x 20 , knee, and hip add/add- flex/ext and rotation for each LE  x 25  min.                    PATIENT EDUCATION: Education details: Exercise technique with VC   HOME EXERCISE PROGRAM:  Access Code: [email or web address] URL: [email or web address] Date: 03/23/2022 Prepared by: Ferrell Hu  Exercises - Supine Bridge  - 3 x weekly - 3 sets - 10 reps - 2 hold - Supine Gluteal Sets  - 3 x weekly - 3 sets - 10 reps - 5 sec hold - Supine Quad Set  - 1 x daily - 3 x weekly - 3 sets - 10 reps - 5 hold - Seated Long Arc Quad  - 1 x daily - 7 x weekly - 3 sets - 10 reps - Seated Hip Adduction Squeeze with Ball  - 1 x daily - 3 x weekly - 3 sets - 10 reps - 5 hold - Seated Hip Abduction  - 1 x daily - 3 x weekly - 3 sets - 10 reps - 2 hold    PT Short Term Goals -       PT SHORT TERM GOAL #1   Title Pt will be independent with HEP in order to improve strength and balance in order to decrease fall risk and improve function at home and work.    Baseline 01/04/2021= No formal HEP in place; 12/12 no HEP in place; 05/10/2021-Patient and his father were able to report compliance with curent HEP consisting of mostly seated/reclined LE strengthening. Both verbalize no questions at this time.    Time 6    Period Weeks    Status Achieved    Target Date 02/15/21            PT LONG TERM GOAL #1     Title Patient will increase BLE gross strength by 1/2 muscle grade to improve functional strength for improved independence with potential gait, increased standing tolerance and increased ADL ability.     Baseline 01/04/2021- Patient presents with 1/5 to 3-/5 B LE strength with MMT; 12/12: goal partially met for Left knee/hip; 05/10/2021= 2-/5 bilateal Hip flex; 3+/5 bilateral Knee ext; 06/21/2021= Patient presents with 2-/5 bilateral Hip flex; 3+/5 bilateral knee ext/flex; 2-/5 left ankle DF; 0/5 right ankle- and able to increase reps and resistance with LE's. 09/15/2021- Patient technically presents with 2-/5 B hip flex/abd/add - but he is able to raise his hip up to approx 100 deg which has improved. 3+/5 Bilateral knee ext, 2-/5 left ankle and 0/5 right ankle.  12/08/2021= Patient able to lift left knee at 110 deg of hip flex; presents with 3+/5 knee ext, 2-/5 left ankle DF and 0/5 right ankle  DF, 2-/5 bilateral Hip abd in seated position.    12/6: R: knee 3+/5 ext, 2/5 flexion, left knee 3+/5 extension, 3+/5 flexion, R hip: 2+/5 hip add, 2+/5 hip ABD L hip: 4-/5 hip ABD, 3+/5 hip ADD, 3+/5 hip flexion; 06/06/2022= Patient now presents with 2-/5 right ankle DF/PF;     Time 12     Period Weeks     Status MET    Target Date 03/07/2022         PT LONG TERM GOAL #2  Title Patient will tolerate sitting unsupported demonstrating erect sitting posture for 15 minutes with CGA to demonstrate improved back extensor strength and improved sitting tolerance.     Baseline 01/04/2021- Patient confied to sitting in lift chair or electric power chair with back support and unable to sit upright without physical assistance; 12/12: tolerates <1 minutes upright unsupported sitting. 05/10/2021=static sit with forward trunk lean  in his power wheelchair without back support x approx 3 min. 06/21/2021=Unable to assess today due to patient with acute back pain but on previous visit able to sit x 8 min without back support. 09/15/2021- on last visit- 09/13/2021- patient was able to sit unsupported x 8 min at edge of mat. 10/13/2023 - Patient was able to sit at edge of mat with varying level of assist today from SBA to min A for a total of 20 min. 12/13/2021= Patient demonstrated unsupported sitting at edge of mat for approx 20 min    Time 12     Period Weeks     Status GOAL MET    Target Date 12/08/21          PT LONG TERM GOAL #3    Title Patient will demonstrate ability to perform static standing in // bars > 2 min with Max Assist  without loss of balance and fair posture for improved overall strength for pre-gait and transfer activities.     Baseline 01/04/2021= Patient current uanble to stand- Dependent on hoyer or sit to stand lift for transfers. 05/10/2021=Not appropriate yet- Currently still dependent with all transfers using hoyer. 06/21/2021= Patient continuing now to focus on LE strengthening to prepare for  standing-unable to try today due to acute low back pain-  planning on attempting in new cert period. 09/15/2021- Patient has attempted standing 2x in past two week- max Assist of 2 people - only once was he successful to clearing his bottom from chair - Will continue to be a focus during the new certification. 12/13/2021= Patient has been limited secondary to increased overall low back pain during this certification and will require more time to focus on this goal.  12/6: not assessed this date, will assess at date when 2-3 PTs are present for assistance     Time 12     Period Weeks     Status GOAL not appropriate at this time - may attempt in future once Patient presents with improved overall LE strength.                 PT LONG TERM GOAL #4    Title Pt will improve FOTO score by 10 points or more demonstrating improved perceived functional ability     Baseline FOTO 7 on 10/17; 03/15/21: FOTO 12; 05/10/2021 06/21/2021= 1; 09/15/2021= 9; 12/13/2021= Will issue next visit 12/6: 4; 06/06/2022= Will assess next visit; Goal not appropriate as patient is not ambulatory.     Time 12     Period Weeks     Status WITHDRAWN    Target date 11/30/2022         PT LONG TERM GOAL #5    Title Patient will perform sit to stand transfer with appropriate AD and max assist of 2 people with 75% consistency to prepare for pregait activities.     Baseline 09/15/2021= Patient unable to stand well- unable to clear his bottom off chair with Max assist of 2 persons. 12/13/2021- Goal not appropriate to try yet but will keep and roll over to next cert as shift  continues to focus on transfers/standing; 4/24= Patient able to perform active ankle DF/PF with right LE and able to raise his knee into seated march and clear floor without physical assist today - as previously unable as well as lift right knee ext to near full ROM to improve strength for eventual standing. 09/07/2022- Goal continues to not be appropriate but patient is now standing  some and will keep goal active; 11/23/2022= patient is now performing sit to stand with max A +2 and now able to clear his bottom off mat. 11/30/2022- Patient continues to perform sit to stand transfers with Max A+2 and able to clear bottom well off mat but not erect yet. 05/31/2023- Patient is able to stand consistently with max A +2 - unable to achieve full knee or trunk ext yet is able to clear bottom off mat for improved LE weightbearing.     Time 12     Period Weeks     Status PROGRESSING    Target date  08/23/2023       PT LONG TERM GOAL #6  Title Patient will tolerate sitting unsupported demonstrating erect sitting posture for 30 minutes with CGA to demonstrate improved back extensor strength and improved sitting tolerance.   Baseline 12/13/2021= Patient demonstrated unsupported sitting at edge of mat for approx 20 min;  12/29/2021- Patient performed approx 30 min of dynamic sitting activities today. 06/06/2022= Patient demonstrated ability to sit and perform static and dynamic UE/LE movement with only Supervision.   Time 12   Period Weeks   Status GOAL MET           7.  Patient will tolerate 2 minutes or more of standing in sit to stand lift or with max assist +2 in order to indicate improved lower extremity weightbearing tolerance for progression to standing in parallel bars. Baseline: 1 minute on most recent stand 02/21/22; 06/06/2022- Patient did attempt today after complaining of right LE pain last week. He attempted 3 stands using sit to stand lift. - all over 1 min- last one approx 48 sec- stopped due to fatigue. More erect standing in lift today - still poor gluteal strength but able to activate glutes and extend hips upon command but unable to hold > 5 sec. 07/28/2022- Will attempt standing next visit but patient has been able to stand since last progress note for up to 3 min at a time at his best. 09/07/2022-  attempted standing with max +2 and patient able to stand 15-20 sec so will now   revise goal; 11/23/2022- Patient at his best between last 2 visits was able to stand approx 50 sec with max A+2.  11/30/2022- Patient performed 4 trials of static standing - max A +2- clearing bottom and using forearms to bear weight  03/15/2023: Patient performed 2 trials of static standing - max A +2- clearing edge of mat and using forearms to bear weight for approximately 45 seconds, is able to come into approximately 45 degrees of knee flexion from sit to stand; 05/31/2023- Patient has not attempted standing in 1 month but able to perform x 4 trials with max A +2 (PT and his mom) and able to clear his bottom off mat and attempted to activate his glutes and apply some forearm pressure - able to stand between 25-40 sec today.  Goal status: PROGRESSING Target date: 08/23/2023   8. Patient will tolerate sitting unsupported demonstrating ability to perform dynamic UE/LE activities for 30 minutes  independently to demonstrate ability  to sit at edge of bed to eat or perform some ADL's/exercise for optimal quality of life.             Baseline: 06/06/2022- Patient able to sit and perform some UE/LE exercises but requires CGA at times for safety- he is able to static sit for 30 min with supervision. 07/27/2022= Patient able to now sit > 30 min with Supervision only - performing static and dynamic activities. Will keep goal active to ensure patient is consistent. 09/07/2022=  Patient able to demonstrate consistent ability to sit at edge of mat during visits             Goal Status: MET             Target date: 08/29/2022        Plan     Clinical Impression Statement  Patient presented initially very tight and anxious today. He presented with increased tightness initially and guarded. He did progress during session but visibly more upset overall.   Pt will continue to benefit from skilled physical therapy intervention to address impairments, improve QOL, and attain therapy goals.    Personal Factors and  Comorbidities Comorbidity 3+;Time since onset of injury/illness/exacerbation    Comorbidities CVA, diabetes, Seizures    Examination-Activity Limitations Bathing;Bed Mobility;Bend;Caring for Others;Carry;Dressing;Hygiene/Grooming;Lift;Locomotion Level;Reach Overhead;Self Feeding;Sit;Squat;Stairs;Stand;Transfers;Toileting    Examination-Participation Restrictions Cleaning;Community Activity;Driving;Laundry;Medication Management;Meal Prep;Occupation;Personal Finances;Shop;Yard Work;Volunteer    Stability/Clinical Decision Making Evolving/Moderate complexity    Rehab Potential Fair    PT Frequency 2x / week    PT Duration 12 weeks    PT Treatment/Interventions ADLs/Self Care Home Management;Cryotherapy;Electrical Stimulation;Moist Heat;Ultrasound;DME Instruction;Gait training;Stair training;Functional mobility training;Therapeutic exercise;Balance training;Patient/family education;Orthotic Fit/Training;Neuromuscular re-education;Wheelchair mobility training;Manual techniques;Passive range of motion;Dry needling;Energy conservation;Taping;Visual/perceptual remediation/compensation;Joint Manipulations    PT Next Visit Plan core strength/motor control in short-sitting, sit to stand if able; Continue with progressive LE Strengthening. Stand activities as appropriate. Attempt Stedy (sit to stand device) in future visits.   PT Home Exercise Plan No changes to HEP today    Consulted and Agree with Plan of Care Patient;Family member/caregiver    Family Member Consulted             03/09/2023, 8:04 AM      5:11 PM, 08/17/23 [employer] [employer] PT Physical Therapist - [facility]  5:11 PM 08/17/23

## 2023-08-17 NOTE — Therapy (Signed)
 Occupational Therapy Neuro TreatmentNote     Patient Name: Paul Hall MRN: [id number] DOB:03-24-1996, 28 y.o., male Today's Date: 08/17/2023  PCP: Dr. Lewayne Records REFERRING PROVIDER: Dr. Lewayne Records    OT End of Session - 08/17/23 0857     Visit Number [id number]    Number of Visits 156    Date for OT Re-Evaluation 10/30/23    OT Start Time 1705    OT Stop Time 1745    OT Time Calculation (min) 40 min    Activity Tolerance Patient tolerated treatment well    Behavior During Therapy WFL for tasks assessed/performed                        Past Medical History:  Diagnosis Date   Diabetes mellitus (HCC)    Hypertension    Stroke [facility])    Past Surgical History:  Procedure Laterality Date   IVC FILTER PLACEMENT ([facility] HX)     LEG SURGERY     PEG TUBE PLACEMENT     TRACHEOSTOMY     Patient Active Problem List   Diagnosis Date Noted   Sepsis due to vancomycin  resistant Enterococcus species (HCC) 06/06/2019   SIRS (systemic inflammatory response syndrome) (HCC) 06/05/2019   Acute lower UTI 06/05/2019   VRE (vancomycin -resistant Enterococci) infection 06/05/2019   Anemia 06/05/2019   Skin ulcer of sacrum with necrosis of muscle (HCC)    Urinary retention    Type 2 diabetes mellitus without complication, with long-term current use of insulin  (HCC)    Tachycardia    Lower extremity edema    Acute metabolic encephalopathy    Obstructive sleep apnea    Morbid obesity with BMI of 60.0-69.9, adult (HCC)    Goals of care, counseling/discussion    Palliative care encounter    Sepsis (HCC) 04/27/2019   H/O insulin  dependent diabetes mellitus 04/27/2019   History of CVA with residual deficit 04/27/2019   Seizure disorder (HCC) 04/27/2019   Decubitus ulcer of sacral region, stage 4 (HCC) 04/27/2019   ONSET DATE: 01/2019  REFERRING DIAG: CVA/COVID-19  THERAPY DIAG:  Muscle weakness (generalized)  Rationale for Evaluation and Treatment Rehabilitation  SUBJECTIVE:    SUBJECTIVE STATEMENT:  Pt. Was having transportation issues today. OT appointment time was rescheduled this afternoon to following PT.  Pt accompanied by: self and family member  PERTINENT HISTORY:  Pt. is a 28 y.o. male who was diagnosed with COVID-19, and CVA with resultant quadriplegia. Pt. Was then hospitalized with VRE UTI. PMHx includes: urinary retention, seizure disorder, obstructive sleep disorder, DM Type II, Morbid obesity.   PRECAUTIONS: None  WEIGHT BEARING RESTRICTIONS No  PAIN:  No reports of pain Are you having pain? No  FALLS: Has patient fallen in last 6 months? No  LIVING ENVIRONMENT: Lives with: lives with their family Lives in: House/apartment Stairs: No Level Entry Has following equipment at home: Wheelchair (power) and hoyer lift, sit to stand lift  PLOF: Independent  PATIENT GOALS: To be able to engage in more daily care tasks.  OBJECTIVE:   HAND DOMINANCE: Right  ADLs:  Transfers/ambulation related to ADLs: Eating: Pt. reports being able to hold standard utensils, and is starting to engage more in self-feeding tasks, hand to mouth patterns. Pt. Reports that he does as much as he can with the task, and family assists  with the remainder of the task. Grooming: Pt. Is able to initiate holding an electric toothbrush, and brush his teeth. Family assists LB Dressing:  Total Assist UE dressing: Pt. is now able to reach up to actively assist with grasping , and pulling his gown down. Toileting:  Total Assist Bathing: MaxA UB, Total assist LB Tub Shower transfers: N/A Equipment: See above    IADLs: Shopping: Relies on family to assist Light housekeeping: Total Assist Meal Prep: Total Assist Community mobility:   Medication management:  Total Assist  Financial management: N/A Handwriting: Not legible: Pt. Is able to hold a pen with the left hand, and initiate marking the page. Pt.'s eye glasses were not available  MOBILITY STATUS:  Power  w/c  POSTURE COMMENTS:  Pt. Requires position changes in his power w/c  ACTIVITY TOLERANCE: Activity tolerance:  Fair  FUNCTIONAL OUTCOME MEASURES: FOTO: 40 TR score: 45  05/25/2022:   FOTO: 50 TR score: 45  07/20/22: FOTO 55  03/08/23: 51  UPPER EXTREMITY ROM     Active ROM Left eval Left  03/21/2022 Left 05/25/2022 L  07/20/22 Left 07/25/2022 Left  08/15/2022 Left  09/26/2022 Left  11/16/2022 Left 12/28/2022 Left 03/13/23 Left 05/10/23  Shoulder flexion 119 123 125 130  136 138 130 142 144 144  Shoulder abduction 110 115 115 115  118 121 125 142 140 140  Shoulder adduction             Shoulder extension             Shoulder internal rotation             Shoulder external rotation             Elbow flexion 135(135) 145 145 145  145    145 145  Elbow extension -27(-18) -21(-20) -20(-16)  -25(-10) -23(-10) -21(-10) -20(-18) -20(-18) -20(-17) -17(-16)  Wrist flexion             Wrist extension 10(50) 19(50) 28(50)  30(50) 35(55) 36(55) 36(60) 40(60) 45(60) 45(60)  Wrist ulnar deviation             Wrist radial deviation             Wrist pronation             Wrist supination   Limited by flexor tone       10(15)   (Blank rows = not tested)    Active ROM Right eval Right 03/21/2022 Right 05/25/2022 R  07/20/22 Right  07/25/22 Right 08/15/2022 Right 09/26/2022 Right 11/16/2022 Right 12/28/2022 Right 03/13/23 Right 05/10/23  Shoulder flexion 106 scaption 105  Scaption 62 flexion 110 scaption 100  105 105 105 112 Scaption 123 Scaption 122  Scaption  Shoulder abduction 114 90 97 100  105 117 120 124 124 122  Shoulder adduction             Shoulder extension             Shoulder internal rotation             Shoulder external rotation             Elbow flexion 120(130) 145 145 140  144 140 142 148 148 145  Elbow extension -45(-35) -22(-35) -28(-22)  -32(-21) -32(-28) -28(-26) -28(-28) -27(-26) -27(-40) -38(-35)  Wrist flexion             Wrist extension -30(10)  -25(10) -10(30)  -10(20) -10(25) -20(40) -30(10) -22(34) -22(35) -32(22)  Wrist ulnar deviation             Wrist radial deviation  Wrist pronation             Wrist supination   Limited by flexor tone            12/28/2022: Thumb radial abduction: Right: 12(46), Left: 40(54)  Digits: Bilateral 2nd through 5th digit: MP Hyperextension with PIP, an DIP flexion     UPPER EXTREMITY MMT:     Left eval Left 03/21/2022 Left  05/25/2022 L 07/20/22 Left  08/15/2022 Left 09/26/2022 Left 11/16/2022 Left 12/28/2022 Left  03/13/2023 Left  05/10/23  Shoulder flexion 3-/5 3/5 3+/5 4+/5 4+/5 4+/5 4+/5 4+/5 4+/5 4+/5  Shoulder abduction 3-/5 3-/5 4-/5 4+/5 4+/5 4+5 4+/5 4+/5 4+/5 4+/5  Shoulder adduction            Shoulder extension            Shoulder internal rotation            Shoulder external rotation            Elbow flexion 3/5 3+/5 4-/5 4+/5 5/5 5/5 5/5 5/5 5/5 5/5  Elbow extension 2-/5 2/5  4+/5 4+/5 4+/5  4/5 4/5 4/5 through available range  Wrist flexion            Wrist extension 2/5 2+/5 3-/5  3-/5 3-/5 3-/5 3/5 3/5 3/5  Wrist ulnar deviation            Wrist radial deviation            Wrist pronation            Wrist supination            (Blank rows = not tested)  Right eval Right 03/21/2022 Right  05/25/2022 R 07/20/22 Right 08/15/2022 Right 09/26/2022 Right 11/16/2022 Right 12/28/2022 Right 03/13/2023 Right 05/10/23  Shoulder flexion 3-/5 scaption 3-/5 3-/5 3+/5 3+/5 3+/5 3/5 3/5 3/5 3+/5  Shoulder abduction 3-/5 3-/5 3-/5 4/5 4/5 4/5 4/5 4/5 4/5 4/-5  Shoulder adduction            Shoulder extension            Shoulder internal rotation            Shoulder external rotation            Elbow flexion 3/5 3/5 3+/5 4/5 4+/5 4+/5 4+/5 4+/5 4+/5 4+/5  Elbow extension 2-/5 2/5 3-/5 4+/5 4+/5 4+/5 4+/5 4-/5 4-/5 4-/5  Wrist flexion            Wrist extension 2-/5 2-/5 2/5 2/5 2/5 2/5 2/5 2/5 2/5 2/5  Wrist ulnar deviation            Wrist radial deviation             Wrist pronation            Wrist supination              HAND FUNCTION:  Grip strength: Right: 0 lbs; Left: 0 lbs and Lateral pinch: Right: 5 lbs, Left: 2 lbs  03/21/2022: Lateral pinch: Right: 3.5 lbs, Left: 2 lbs  07/20/22: Grip strength: Right: 3 lbs; Left: 4 lbs and Lateral pinch: Right: 5 lbs, Left: 3 lbs  08/15/2022:  Grip strength: Right: 0 lbs; Left: 5 lbs and Lateral pinch: Right: 2 lbs, Left: 4 lbs  09/26/2022  Grip strength: Right: 0 lbs; Left: 8 lbs and Lateral pinch: Right: 2 lbs, Left: 5 lbs  11/16/2022  Grip strength: Right: 0 lbs; Left: 8 lbs  9/25/20204  Grip strength: Right: 0 lbs; Left: 8 lbs  Grip strength: Right: 0 lbs; Left: 8 lbs   03/08/23:  Grip strength: Right: 0 lbs without PROM, 5 lbs after PROM, Left 8 lbs  05/10/23:  Grip strength: TBD  Bilateral digit PIP/DIP flexion contractures with MP hyperextension with attempts for AROM. Pt. is able to tolerate AROM to the bilateral digits at the initial evaluation however, has a history of pain in the digits.  COORDINATION: Eval: Pt. is unable to grasp 9-hole test pegs. Pt. is able to initiate grasping larger pegs, and is able to hold a pen in the left hand.  07/20/22: 2 min 36 seconds to remove 9 pegs from 9 hole peg test - cues to locate pegs 2/2 low vision. Pt. is able to initiate grasping larger pegs on R hand and is able to hold a pen in the left hand.  08/15/2022:    Vision, and sensation limiting accuracy of 9 hole peg test results. Pt. Was able to grasp and remove vertical pegs intermittently with cues.    05/10/23: TBD  SENSATION: Light touch: Impaired   EDEMA:  N/A  MUSCLE TONE: BUE flexor Spasticity  COGNITION Overall cognitive status: Continue to assess in functional context  VISION:   Subjective report: Pt. was not wearing glasses at the time of the initial eval.  Baseline vision: Vision is very limited. Wears glasses all the time Visual history: History of impaired vision  following CVA. Pt. Has received treatment through the [facility] low vision rehabilitation program.   VISION ASSESSMENT: Impaired To be further assessed in functional context  PERCEPTION: Impaired   PRAXIS: Impaired: motor planning  OBSERVATIONS:  Pt reports being on Tramadol   TODAY'S TREATMENT  08/16/23   Therapeutic Ex.:  -Emphasis was placed on slow prolonged gentle passive stretching for RUE, forearm supination, bilateral wrist extension, wrist extension, bilateral digit MP, PIP, and DIP extension following moist heat, and left wrist flexion/extension, PIP/DIP flexion, and extension while heat was applied to the RUE. PROM was performed to decrease stiffness, and prepare the RUE for functional use. ROM was performed to the shoulder right elbow, forearm, wrist, and digits. PROM for bilateral digit MP, PIP, and DIP extension in preparation for placing them onto a flat surface  Neuromuscular re-education:  -Facilitated formulating grasp/release patterns using the right hand to grasp 1" cubes.   -Pt. required support at the wrist, visual cues, and adjustments to the position of the cubes to facilitate grasping.  Therapeutic Activities:  -Facilitated functional reaching with the right UE through multiple planes combined with visual scanning to locate the container in preparation for discarding items. -Assist was required proximally, with increased assist, and cues for shoulder abduction with pectoral stretching when reaching out to the side to place the objects.  -Assist, and cues were required for right elbow extension.          PATIENT EDUCATION: Education details: stretching, PROM, massage to the digits on the bilateral hands. Person educated: Patient and Parent Education method: Explanation Education comprehension: verbalized understanding  HOME EXERCISE PROGRAM:  Continue ongoing assessment, and continue to provide as needed.   GOALS: Goals reviewed with patient? Yes  SHORT  TERM GOALS: Target date: 09/18/2023     To assess splint fit, and make appropriate adjustments to promote good skin integrity through the palmar surface of the bilateral hands.  Baseline: 05/25/22: Goal currently met, however ongoing as needs to assess splint fit arise. 03/23/2022: Pt. is wearing splints a couple of  hours at night bilateral resting hand splints. 03/21/2022: Pt. is wearing splints a couple of hours at night bilateral resting hand splints. Goal status: Deferred   LONG TERM GOALS: Target date:10/30/2023      FOTO score will Improve by 2 points for Pt. perceived improvement with the assessment specific ADL/IADL tasks.  Baseline:  03/08/23: 51; 12/28/2022: FOTO score: 56; 11/16/2022: 52 09/27/2022: 51 07/20/2022: FOTO 55 05/25/2022: FOTO score: 50,  TR score: 45 Eval: FOTO score: 40,  TR score: 45 Goal status: Achieved   2.   Pt. will independently perform oral care for 100% of the task after complete set-up. Baseline: 05/10/23: Pt. Performs 90% of the task. Pt.'s mother assists with 10% of the task for thorough cleaning the hard to reach places way in the back of the oral cavity.03/08/23: not as much assist required proximally, but to adjust positioning of toothbrush; 02/13/23: Pt. Continues to require assist proximally 01/09/2023: Continue 12/28/2022:  Pt. Continues to complete 90% of the task with complete set-up, with visual cues, and occassional assist of the right elbow. 11/16/2022: Pt. Completes 90% of the task with complete set-up. 09/26/2022: Completes 90% of the task, proximal assist at times required at the elbow.  08/15/2022: Completes 90% of the task, proximal assist at times required at the elbow. 07/20/22: completes 90% of task, limited by shoulder flexion. 05/25/2022:  Pt. Is able to initiate and perform oral care for approximately 90% of the task. Complete set-up required. Assi needed only for the very back teeth. 03/23/2022: Pt. Is able to initiate and perform oral care for approximately  75% of the task. Pt. Requires assist at proximally at the elbow for through oral care. 03/21/2022: Pt. Is able to initiate and perform oral care for approximately 75% of the task. Pt. Requires assist at proximally at the elbow for through oral care. Eval: Pt. is able to initiate using an electric toothbrush. Pt. requires assist for set-up, and assist for thoroughness, and as he Pt. fatigues. Goal status:  Partially met, Goal discontinued  3.  Pt. Will be modified independence with self-feeding for 100% using a swivel spoon, and standard fork after complete set-up Baseline: 05/10/2023: Pt. feeds self 95-100% of the meal. with occasional assist to reposition the utensil. 03/08/23: indep with cup with a handle and lid, assist to reposition eating utensils in hand;  02/13/23:  Pt. Is now able to consistently, and efficiently use a swivel spoon for eating cereal with the left hand.01/09/2023: Continue 12/28/2022: Pt. Is now feeding himself 90% of the time using a swivel spoon with the left hand, and 95% of the time with the fork. Pt. Continues to have difficulty with cutting food. 11/16/2022: 100% with finger foods, Pt. Is initiating with a swivel spoon, and universal cuff for fork use, however requires assist for consistency, and accuracy.  09/26/2022: 90% using the spoon, assist at time with the forearm motion, and grip on the utensils. 100% for finger foods.  08/15/2022:  90% using the spoon, assit at time with the forearm motion, and grip on the utensils. 100% for finger foods-independent with set-up- unsupervised.  07/20/22: self-feeds cereal using spoon 90% of task. 05/25/2022: Pt. Is able to use a spoon to scoop cereal when feeding himself cereal 85% of the time. Pt. Is able to feed himself snack/finger foods 100% of the time. Pt. Continues to work on consistency of  stabilizing a cup/mug when drinking. Pt. Is able to grasp a water  bottle with assist initially, with assist tapering off  as he drinks.03/23/2022: Pt. Is  able to perform scooping cereal for 75% of the time. Pt. required assist, and support at the left elbow, and Pt. Presents with limited forearm supination when using the spoon, and bringing it towards his mouth. Pt. Is able to use a fork to spear items, and perform the hand to mouth pattern.  03/21/2022: Pt. Is able to perform scooping cereal for 75% of the time. Pt. required assist, and support at the left elbow, and Pt. presents with limited forearm supination when using the spoon, and bringing it towards his mouth. Pt. Is able to use a fork to spear items, and perform the hand to mouth pattern.  Eval: Pt. is able to hold standard standard utensils. Pt. Performs as much of the task as he, can and has assistance for the remainder. Goal status:  Improved, Achieved  4.  Pt. will improve grasp patterns and consistently grasp 1/4" objects for ADL, and IADL tasks.  Baseline:08/07/23: Continue 05/31/2023: Continue 05/10/23: Pt. is consistently able to grasp 1/2" objects with visual cues. Pt. Requires assist, and increased visual cues to grasp 1/4" objects on an elevated plane.  Pt. Is unable to grasp the flat at the tabletop surface. Pt. Is grasping grasping small puppy vitamins.03/08/23: 1/2" objects efficiently; 02/13/2023: 1/2" objects efficiently 01/09/2023: Continue 12/28/2022: Continues to grasp 1/4" pegs with min a + visual cues, consistently grasping 1/2" objects with visual cues 11/16/2022: Continues to grasp 1/4" pegs with min a + visual cues, consistently grasping 1/2" objects with visual cues 09/26/2022: Continues to grasp 1/4" pegs with min a + visual cues, consistently grasping 1/2" objects with visual cues. 08/15/2022: grasps 1/4" pegs with min a + visual cues, consistently grasping 1/2" objects with visual cues. 07/20/22: grasps 1/4" pegs with min a + visual cues, consistently grasping 1/2" objects with visual cues. 05/25/2022: Pt. Is working on improving consistency of grasping 1/2" objects with visual cues.   03/23/2022: Pt. Is able to grasp 1" objects consistently,and continues to work on the hand patterns needed to grasp 1/2" objects.03/21/2022: Pt. Is able to grasp 1" objects consistently,and continues to work on the hand patterns needed to grasp 1/2" objects. Eval: Pt. is able to grasp 1" objects intermittently using a lateral grasp pattern. Goal status: Ongoing  5.  Pt. will independently write his name legibly with letter sizes under 1". Baseline: 05/10/23: Deferred 03/08/23: Not addressed this period; 02/13/2023: Continue 01/09/2023: Continue. 12/28/2022: Pt. is now using an IPad Pen. 11/16/2022: Pt. Continues to be able to write name with smaller letter size. Pt. Is signing name with a computer styus. Pt. Requires visual cues, and assist 09/26/2022: Pt. Continues to be able to write name with smaller letter size. Pt. Is signing name with a computer styus. Pt. Requires visual cues, and assist. 08/15/2022: Pt. Is able to write name with smaller letter size. Pt. Is signing name with a computer styus. Pt. Requires visual cues, and assist. 07/20/22: stabilizing assist to write name with 75% legibility with 2" letters. 05/25/2022: Pt. to continue to work towards formulating grasp patterns in preparation for grasping a large width pen. Pt. Requires visual cues. 03/23/2022: Pt. Is able to write his name with modA, however has difficulty with formulating letter sizes less than 2" in size with 50% legibility for the 3 letters of his name.03/21/2022: Pt. Is able to write his name with modA, however has difficulty with formulating letter sizes less than 2" in size with 50% legibility for the 3 letters of his  name. Eval: Pt is able to hold a thin marker with his left hand, and formulate a line, and initiate a circular pattern (Pt. without glasses today) Goal status: Deferred  6. Pt. Will reach up to comb/brush his hair with minA.  Baseline: 05/10/23: Pt. is able to use a pick independently with the left hand with occasional  assist to the far side of the left his head. 03/08/23: Pt uses pick in L hand to reach 75% of his head; unable to reach R side of head; 02/13/2023:Pt. is able to use a hair pick with the left hand on the right side of his head, top, and back of the head. Pt. Continues to have difficulty reaching further to the right side of his head with the left. 01/09/2023: Continue 12/28/2022: Pt. Is progressing, and is now able to use a hair pick with the left hand on the right side of his head, top, and back of the head. Pt. Continues to have difficulty reaching further to the right side of his head with the left. 11/16/2022: Pt. Is able to use a hair pick for the left side of his head using his left hand. Pt. Presents with difficulty reaching to the right side of his head.09/26/2022: Pt. Is able to use a hair pick for the left side of his head using his left hand. Pt. Presents with difficulty reaching to the right side of his head. 5/13/202: 75% using a hair pick, assist to the right side of the head. 07/20/22: reaches 75% of head, assist for far R side of head, fatigues quickly. 05/25/2022: Pt. Is able to reach up with the left hand to the left side, top, and back of his head. 03/23/2022: Pt. is now able to more consistently initiate reaching up to his head with his left hand in preparation for haircare12/18/2023: Pt. is now able to more consistently initiate reaching up to his head with his left hand in preparation for haircare. Eval: Pt. is able to initiate reaching up for hair care with a long handled brush, however is unable to sustain UE's in elevation to perform the task.     Goal status: Achieved  7. Pt. Will independently navigate the w/c through his environment with minA with visual scanning, and hand placement on the controls.  Baseline: 11/16/2022: Deferred 2/2 vision 09/26/2022: Pt. Is now navigating his w/c ouside the home, and around his block. 08/15/2022: Pt. Requires minA to setup hand on controls and MIN cues to  navigate the w/c in wide spaces, requires MIN - MOD cues to navigate the w/c through more narrow doorways, and tighter turns - varies based on good vs bad vision days.07/20/22: Pt. Requires minA to setup hand on controls and MIN cues to navigate the w/c in wide spaces, requires MIN - MOD cues to navigate the w/c through more narrow doorways, and tighter turns - varies based on good vs bad vision days. 05/25/2022: Pt. Requires minA  and  cues to navigate the w/c in wide spaces, and requires Mod cues to navigate the w/c through more narrow doorways, and tighter turns. Pt. Requires max cues for scanning through the environment, and moderate cues for hand placement on the controls.     Goal status: Deferred 2/2 vision    8. Pt. Will improve bilateral grip strength to be able to independently grasp, and pull up blankets, and linens.   Baseline: 05/10/23: Deferred 03/08/23: R grip 0-5 lbs, L grip 8 lbs; pt reports he can consistently pull  up blankets and linens independently.  02/13/2023: Continue. Pt. presents with digit PIP, and DIP flexor tightness. 01/09/2023: Continue 12/28/2023: R: 0#, L: 5# Pt. Is now able to more consistently grasp and pull blankets up over him. 11/16/2022: R: 0 L: 8# Pt. Is more consistently grasping his blankets and attempting to pull them up. 09/26/2022: R: 0  L: 8# Pt. is attempting to grasp, and pull blankets up more, using mostly the left hand. 08/15/2022: Pt. has difficulty securely holding, and pulling up blankets, and linens.    Goal status:Deferred  9. Pt. will consistently actively control the releasing of blankets, covers, and linens from his hands once they are in the desired position over him. Baseline:12/28/2022: Pt. Is consistently actively able to release linens once they are in the desired position over him. 11/16/2022: Pt. continues to improve with actively releasing blankets form his hands. 09/26/2022: Pt. is improving with actively releasing blankets form his hands. 08/15/2022:  Pt. has difficulty with controlled releasing of blankets/linens from his grasp.     Goal status: Achieved    10. Pt. Will improve bilateral UE strength by 2 MM grades to assist with ADLs, and IADLs  Baseline: 08/07/23: Continue 05/31/2023: Continue 05/10/23: Right: shoulder flexion: 3+/5, abduction: 44-/5, elbow flexion: 4+/5 , extension: 4-/5 wrist extension: 2/5; Left: shoulder flexion: 4+/5, abduction: 4+/5, elbow flexion: 5/5 , extension: 4/5  wrist extension: 3/5 03/13/2023: Right: shoulder flexion: 3/5, abduction: 4/5, elbow flexion: 4+/5 , extension: 4-/5 wrist extension: 2/5; Left: shoulder flexion: 4+/5, abduction: 4+/5, elbow flexion: 5/5 , extension: 4/5  wrist extension: 3/5 03/08/23: NT this date d/t as pt arrived late; 02/13/2023: Continue 01/09/2023: Continue 12/28/2022: Right: shoulder flexion: 3/5, abduction: 4/5, elbow flexion: 4+/5 , extension: 4-/5 wrist extension: 2/5; Left: shoulder flexion: 4+/5, abduction: 4+/5, elbow flexion: 5/5 , extension: 4/5  wrist extension: 3/5 Right: shoulder flexion: 3+/5, abduction: 4/5, elbow flexion: 4+/5 , extension: 4+/5 wrist extension: 2/5; Left: shoulder flexion: 4+/5, abduction: 4+/5, elbow flexion: 5/5 , extension: 4+/5 wrist extension: 3-/5 11/16/2022: Right: shoulder flexion: 3/5, abduction: 4/5, elbow flexion: 4+/5 , extension: 4+/5 wrist extension: 2/5; Left: shoulder flexion: 4+/5, abduction: 4+/5, elbow flexion: 5/5 , extension:  wrist extension: 3-/5 Right: shoulder flexion: 3+/5, abduction: 4/5, elbow flexion: 4+/5 , extension: 4+/5 wrist extension: 2/5; Left: shoulder flexion: 4+/5, abduction: 4+/5, elbow flexion: 5/5 , extension: 4+/5 wrist extension: 3-/5 Goal status: Ongoing  11. Pt. will improve right wrist extension by 10 degrees to initiate reaching for items in preparation for ADLs. Baseline: 08/07/23: Continue 05/31/23: Continue 05/10/23: Right wrist extension: -32(22) 03/13/23: -22(35) 03/08/23: NT this date as pt arrived late;  02/13/2023: Continue 01/09/2023: Continue 12/28/2022: -22(34) 11/16/2022: -30(10)    Goal status: Ongoing      12. Pt. Will require donn/doff a Tank top T-shirt efficiently with supervision and full set-up.    Baseline: 08/07/23: Continue 05/31/2023: Pt. Requires maxA donning a pullover sweatshirt, Mod-maxA doffing it. T-shirt tank TBD 05/10/23: Pt. Presents with difficulty completing, and requires increased time to complete    Goal status: Ongoing    13. Pt. Will will independently perform bilateral/bimanual hand tasks needed to fold towels/linens/laundry Baseline: 08/07/23: Continue 05/31/23: Continue 05/10/23:  Pt. Has difficulty folding towels/lines/laundry using bilateral hands Goal status:Ongoing  14. Pt. Will independently, and efficiently reach his right arm over the side of the chair to pet his dog.    Baseline: 08/07/23: Continue 05/31/2023: Continue 05/10/23: Pt. Is unable to efficiently reach his right UE over the side of his  recliner chair to pet his dog.    Goal status: Ongoing    15. Pt. Will independently, and efficiently reach out in front of him to efficiently place items onto his table while sitting in his recliner.     Baseline: 08/07/23: Continue 2/26/225: Continue 05/10/23: Pt. has difficulty reaching out in front of him far enough to efficiently place items on the table while sitting in a recliner chair.    Goal status: Ongoing   ASSESSMENT:  CLINICAL IMPRESSION:   Pt. requires cues and assist for support proximally when reaching through multiple planes with the RUE. Pt. continues to present with tightness through the pectoral region, the RUE, right forearm supination, and bilateral digit PIP, and DIP flexion limiting grasp patterns. Pt. was able to achieve increased digit MP extension bilaterally when anticipating grasp, and during the of release objects. Pt. continues to be highly motivated, and Pt.'s caregivers are very supportive to continue working towards improving BUE  functioning, ROM, strength, motor control, [facility] skills, as well as visual compensatory strategies in order to maximize independence with daily ADLs, and IADL functioning.      PERFORMANCE DEFICITS in functional skills including ADLs, IADLs, coordination, dexterity, proprioception, ROM, strength, pain, FMC, GMC, decreased knowledge of use of DME, and UE functional use, cognitive skills including safety awareness, and psychosocial skills including coping strategies, environmental adaptation, habits, and routines and behaviors.   IMPAIRMENTS are limiting patient from ADLs, IADLs, leisure, and social participation.   COMORBIDITIES may have co-morbidities  that affects occupational performance. Patient will benefit from skilled OT to address above impairments and improve overall function.  MODIFICATION OR ASSISTANCE TO COMPLETE EVALUATION: Maximum or significant modification of tasks or assist is necessary to complete an evaluation.  OT OCCUPATIONAL PROFILE AND HISTORY: Comprehensive assessment: Review of records and extensive additional review of physical, cognitive, psychosocial history related to current functional performance.  CLINICAL DECISION MAKING: High - multiple treatment options, significant modification of task necessary  REHAB POTENTIAL: Fair    EVALUATION COMPLEXITY: High    PLAN: OT FREQUENCY: 2 x's a week  OT DURATION:12 weeks  PLANNED INTERVENTIONS: self care/ADL training, therapeutic exercise, therapeutic activity, neuromuscular re-education, manual therapy, passive range of motion, patient/family education, and cognitive remediation/compensation RECOMMENDED OTHER SERVICES: PT  CONSULTED AND AGREED WITH PLAN OF CARE: Patient and family member/caregiver  PLAN FOR NEXT SESSION: Review HEP  Duey Ghent, MS, OTR/L  08/14/23

## 2023-08-21 ENCOUNTER — Ambulatory Visit: Payer: Medicare Other

## 2023-08-21 ENCOUNTER — Ambulatory Visit: Payer: Medicare Other | Admitting: Occupational Therapy

## 2023-08-21 DIAGNOSIS — R2681 Unsteadiness on feet: Secondary | ICD-10-CM

## 2023-08-21 DIAGNOSIS — I6930 Unspecified sequelae of cerebral infarction: Secondary | ICD-10-CM | POA: Diagnosis not present

## 2023-08-21 DIAGNOSIS — R269 Unspecified abnormalities of gait and mobility: Secondary | ICD-10-CM

## 2023-08-21 DIAGNOSIS — M6281 Muscle weakness (generalized): Secondary | ICD-10-CM | POA: Diagnosis not present

## 2023-08-21 DIAGNOSIS — R278 Other lack of coordination: Secondary | ICD-10-CM | POA: Diagnosis not present

## 2023-08-21 DIAGNOSIS — R2689 Other abnormalities of gait and mobility: Secondary | ICD-10-CM | POA: Diagnosis not present

## 2023-08-21 DIAGNOSIS — R262 Difficulty in walking, not elsewhere classified: Secondary | ICD-10-CM | POA: Diagnosis not present

## 2023-08-21 NOTE — Therapy (Signed)
 Occupational Therapy Neuro Treatment Note     Patient Name: Paul Hall MRN: [id number] DOB:1995-07-07, 28 y.o., male Today's Date: 08/21/2023  PCP: Dr. Lewayne Records REFERRING PROVIDER: Dr. Lewayne Records    OT End of Session - 08/21/23 1716     Visit Number [id number]    Number of Visits 156    Date for OT Re-Evaluation 10/30/23    Authorization Type 09/26/2022    OT Start Time 1530    OT Stop Time 1615    OT Time Calculation (min) 45 min    Activity Tolerance Patient tolerated treatment well    Behavior During Therapy WFL for tasks assessed/performed                        Past Medical History:  Diagnosis Date   Diabetes mellitus (HCC)    Hypertension    Stroke [facility])    Past Surgical History:  Procedure Laterality Date   IVC FILTER PLACEMENT ([facility] HX)     LEG SURGERY     PEG TUBE PLACEMENT     TRACHEOSTOMY     Patient Active Problem List   Diagnosis Date Noted   Sepsis due to vancomycin  resistant Enterococcus species (HCC) 06/06/2019   SIRS (systemic inflammatory response syndrome) (HCC) 06/05/2019   Acute lower UTI 06/05/2019   VRE (vancomycin -resistant Enterococci) infection 06/05/2019   Anemia 06/05/2019   Skin ulcer of sacrum with necrosis of muscle (HCC)    Urinary retention    Type 2 diabetes mellitus without complication, with long-term current use of insulin  (HCC)    Tachycardia    Lower extremity edema    Acute metabolic encephalopathy    Obstructive sleep apnea    Morbid obesity with BMI of 60.0-69.9, adult (HCC)    Goals of care, counseling/discussion    Palliative care encounter    Sepsis (HCC) 04/27/2019   H/O insulin  dependent diabetes mellitus 04/27/2019   History of CVA with residual deficit 04/27/2019   Seizure disorder (HCC) 04/27/2019   Decubitus ulcer of sacral region, stage 4 (HCC) 04/27/2019   ONSET DATE: 01/2019  REFERRING DIAG: CVA/COVID-19  THERAPY DIAG:  Muscle weakness (generalized)  Rationale for Evaluation and Treatment  Rehabilitation  SUBJECTIVE:   SUBJECTIVE STATEMENT:  Pt. reports doing well today.  Pt accompanied by: self and family member  PERTINENT HISTORY:  Pt. is a 28 y.o. male who was diagnosed with COVID-19, and CVA with resultant quadriplegia. Pt. Was then hospitalized with VRE UTI. PMHx includes: urinary retention, seizure disorder, obstructive sleep disorder, DM Type II, Morbid obesity.   PRECAUTIONS: None  WEIGHT BEARING RESTRICTIONS No  PAIN:  No reports of pain Are you having pain? No  FALLS: Has patient fallen in last 6 months? No  LIVING ENVIRONMENT: Lives with: lives with their family Lives in: House/apartment Stairs: No Level Entry Has following equipment at home: Wheelchair (power) and hoyer lift, sit to stand lift  PLOF: Independent  PATIENT GOALS: To be able to engage in more daily care tasks.  OBJECTIVE:   HAND DOMINANCE: Right  ADLs:  Transfers/ambulation related to ADLs: Eating: Pt. reports being able to hold standard utensils, and is starting to engage more in self-feeding tasks, hand to mouth patterns. Pt. Reports that he does as much as he can with the task, and family assists  with the remainder of the task. Grooming: Pt. Is able to initiate holding an electric toothbrush, and brush his teeth. Family assists LB Dressing: Total Assist UE dressing:  Pt. is now able to reach up to actively assist with grasping , and pulling his gown down. Toileting:  Total Assist Bathing: MaxA UB, Total assist LB Tub Shower transfers: N/A Equipment: See above    IADLs: Shopping: Relies on family to assist Light housekeeping: Total Assist Meal Prep: Total Assist Community mobility:   Medication management:  Total Assist  Financial management: N/A Handwriting: Not legible: Pt. Is able to hold a pen with the left hand, and initiate marking the page. Pt.'s eye glasses were not available  MOBILITY STATUS:  Power w/c  POSTURE COMMENTS:  Pt. Requires position changes in  his power w/c  ACTIVITY TOLERANCE: Activity tolerance:  Fair  FUNCTIONAL OUTCOME MEASURES: FOTO: 40 TR score: 45  05/25/2022:   FOTO: 50 TR score: 45  07/20/22: FOTO 55  03/08/23: 51  UPPER EXTREMITY ROM     Active ROM Left eval Left  03/21/2022 Left 05/25/2022 L  07/20/22 Left 07/25/2022 Left  08/15/2022 Left  09/26/2022 Left  11/16/2022 Left 12/28/2022 Left 03/13/23 Left 05/10/23  Shoulder flexion 119 123 125 130  136 138 130 142 144 144  Shoulder abduction 110 115 115 115  118 121 125 142 140 140  Shoulder adduction             Shoulder extension             Shoulder internal rotation             Shoulder external rotation             Elbow flexion 135(135) 145 145 145  145    145 145  Elbow extension -27(-18) -21(-20) -20(-16)  -25(-10) -23(-10) -21(-10) -20(-18) -20(-18) -20(-17) -17(-16)  Wrist flexion             Wrist extension 10(50) 19(50) 28(50)  30(50) 35(55) 36(55) 36(60) 40(60) 45(60) 45(60)  Wrist ulnar deviation             Wrist radial deviation             Wrist pronation             Wrist supination   Limited by flexor tone       10(15)   (Blank rows = not tested)    Active ROM Right eval Right 03/21/2022 Right 05/25/2022 R  07/20/22 Right  07/25/22 Right 08/15/2022 Right 09/26/2022 Right 11/16/2022 Right 12/28/2022 Right 03/13/23 Right 05/10/23  Shoulder flexion 106 scaption 105  Scaption 62 flexion 110 scaption 100  105 105 105 112 Scaption 123 Scaption 122  Scaption  Shoulder abduction 114 90 97 100  105 117 120 124 124 122  Shoulder adduction             Shoulder extension             Shoulder internal rotation             Shoulder external rotation             Elbow flexion 120(130) 145 145 140  144 140 142 148 148 145  Elbow extension -45(-35) -22(-35) -28(-22)  -32(-21) -32(-28) -28(-26) -28(-28) -27(-26) -27(-40) -38(-35)  Wrist flexion             Wrist extension -30(10) -25(10) -10(30)  -10(20) -10(25) -20(40) -30(10) -22(34) -22(35)  -32(22)  Wrist ulnar deviation             Wrist radial deviation  Wrist pronation             Wrist supination   Limited by flexor tone            12/28/2022: Thumb radial abduction: Right: 12(46), Left: 40(54)  Digits: Bilateral 2nd through 5th digit: MP Hyperextension with PIP, an DIP flexion     UPPER EXTREMITY MMT:     Left eval Left 03/21/2022 Left  05/25/2022 L 07/20/22 Left  08/15/2022 Left 09/26/2022 Left 11/16/2022 Left 12/28/2022 Left  03/13/2023 Left  05/10/23  Shoulder flexion 3-/5 3/5 3+/5 4+/5 4+/5 4+/5 4+/5 4+/5 4+/5 4+/5  Shoulder abduction 3-/5 3-/5 4-/5 4+/5 4+/5 4+5 4+/5 4+/5 4+/5 4+/5  Shoulder adduction            Shoulder extension            Shoulder internal rotation            Shoulder external rotation            Elbow flexion 3/5 3+/5 4-/5 4+/5 5/5 5/5 5/5 5/5 5/5 5/5  Elbow extension 2-/5 2/5  4+/5 4+/5 4+/5  4/5 4/5 4/5 through available range  Wrist flexion            Wrist extension 2/5 2+/5 3-/5  3-/5 3-/5 3-/5 3/5 3/5 3/5  Wrist ulnar deviation            Wrist radial deviation            Wrist pronation            Wrist supination            (Blank rows = not tested)  Right eval Right 03/21/2022 Right  05/25/2022 R 07/20/22 Right 08/15/2022 Right 09/26/2022 Right 11/16/2022 Right 12/28/2022 Right 03/13/2023 Right 05/10/23  Shoulder flexion 3-/5 scaption 3-/5 3-/5 3+/5 3+/5 3+/5 3/5 3/5 3/5 3+/5  Shoulder abduction 3-/5 3-/5 3-/5 4/5 4/5 4/5 4/5 4/5 4/5 4/-5  Shoulder adduction            Shoulder extension            Shoulder internal rotation            Shoulder external rotation            Elbow flexion 3/5 3/5 3+/5 4/5 4+/5 4+/5 4+/5 4+/5 4+/5 4+/5  Elbow extension 2-/5 2/5 3-/5 4+/5 4+/5 4+/5 4+/5 4-/5 4-/5 4-/5  Wrist flexion            Wrist extension 2-/5 2-/5 2/5 2/5 2/5 2/5 2/5 2/5 2/5 2/5  Wrist ulnar deviation            Wrist radial deviation            Wrist pronation            Wrist supination               HAND FUNCTION:  Grip strength: Right: 0 lbs; Left: 0 lbs and Lateral pinch: Right: 5 lbs, Left: 2 lbs  03/21/2022: Lateral pinch: Right: 3.5 lbs, Left: 2 lbs  07/20/22: Grip strength: Right: 3 lbs; Left: 4 lbs and Lateral pinch: Right: 5 lbs, Left: 3 lbs  08/15/2022:  Grip strength: Right: 0 lbs; Left: 5 lbs and Lateral pinch: Right: 2 lbs, Left: 4 lbs  09/26/2022  Grip strength: Right: 0 lbs; Left: 8 lbs and Lateral pinch: Right: 2 lbs, Left: 5 lbs  11/16/2022  Grip strength: Right: 0 lbs; Left: 8 lbs  9/25/20204  Grip strength: Right: 0 lbs; Left: 8 lbs  Grip strength: Right: 0 lbs; Left: 8 lbs   03/08/23:  Grip strength: Right: 0 lbs without PROM, 5 lbs after PROM, Left 8 lbs  05/10/23:  Grip strength: TBD  Bilateral digit PIP/DIP flexion contractures with MP hyperextension with attempts for AROM. Pt. is able to tolerate AROM to the bilateral digits at the initial evaluation however, has a history of pain in the digits.  COORDINATION: Eval: Pt. is unable to grasp 9-hole test pegs. Pt. is able to initiate grasping larger pegs, and is able to hold a pen in the left hand.  07/20/22: 2 min 36 seconds to remove 9 pegs from 9 hole peg test - cues to locate pegs 2/2 low vision. Pt. is able to initiate grasping larger pegs on R hand and is able to hold a pen in the left hand.  08/15/2022:    Vision, and sensation limiting accuracy of 9 hole peg test results. Pt. Was able to grasp and remove vertical pegs intermittently with cues.    05/10/23: TBD  SENSATION: Light touch: Impaired   EDEMA:  N/A  MUSCLE TONE: BUE flexor Spasticity  COGNITION Overall cognitive status: Continue to assess in functional context  VISION:   Subjective report: Pt. was not wearing glasses at the time of the initial eval.  Baseline vision: Vision is very limited. Wears glasses all the time Visual history: History of impaired vision following CVA. Pt. Has received treatment through the [facility] low  vision rehabilitation program.   VISION ASSESSMENT: Impaired To be further assessed in functional context  PERCEPTION: Impaired   PRAXIS: Impaired: motor planning  OBSERVATIONS:  Pt reports being on Tramadol   TODAY'S TREATMENT  08/21/23   Therapeutic Ex.:  -Emphasis was placed on slow prolonged gentle passive stretching for RUE, forearm supination, bilateral wrist extension, wrist extension, bilateral digit MP, PIP, and DIP extension following moist heat, and left wrist flexion/extension, PIP/DIP flexion, and extension while heat was applied to the RUE. PROM was performed to decrease stiffness, and prepare the RUE for functional use. ROM was performed to the shoulder right elbow, forearm, wrist, and digits. PROM for bilateral digit MP, PIP, and DIP extension in preparation for placing them onto a flat surface.  Therapeutic Activities:  -Facilitated functional reaching with the right UE through multiple planes through shoulder flexion,  crossing midline, and through abduction in preparation for releasing Jumbo pegs from his hand onto the tabletop surface. -Assist was required proximally, with increased assist, and cues for shoulder abduction with pectoral stretching when reaching out to the side to place the objects.  -Assist, and cues were required for right elbow extension.          PATIENT EDUCATION: Education details: stretching, PROM, massage to the digits on the bilateral hands. Person educated: Patient and Parent Education method: Explanation Education comprehension: verbalized understanding  HOME EXERCISE PROGRAM:  Continue ongoing assessment, and continue to provide as needed.   GOALS: Goals reviewed with patient? Yes  SHORT TERM GOALS: Target date: 09/18/2023     To assess splint fit, and make appropriate adjustments to promote good skin integrity through the palmar surface of the bilateral hands.  Baseline: 05/25/22: Goal currently met, however ongoing as needs to  assess splint fit arise. 03/23/2022: Pt. is wearing splints a couple of hours at night bilateral resting hand splints. 03/21/2022: Pt. is wearing splints a couple of hours at night bilateral resting hand splints. Goal status: Deferred   LONG  TERM GOALS: Target date:10/30/2023      FOTO score will Improve by 2 points for Pt. perceived improvement with the assessment specific ADL/IADL tasks.  Baseline:  03/08/23: 51; 12/28/2022: FOTO score: 56; 11/16/2022: 52 09/27/2022: 51 07/20/2022: FOTO 55 05/25/2022: FOTO score: 50,  TR score: 45 Eval: FOTO score: 40,  TR score: 45 Goal status: Achieved   2.   Pt. will independently perform oral care for 100% of the task after complete set-up. Baseline: 05/10/23: Pt. Performs 90% of the task. Pt.'s mother assists with 10% of the task for thorough cleaning the hard to reach places way in the back of the oral cavity.03/08/23: not as much assist required proximally, but to adjust positioning of toothbrush; 02/13/23: Pt. Continues to require assist proximally 01/09/2023: Continue 12/28/2022:  Pt. Continues to complete 90% of the task with complete set-up, with visual cues, and occassional assist of the right elbow. 11/16/2022: Pt. Completes 90% of the task with complete set-up. 09/26/2022: Completes 90% of the task, proximal assist at times required at the elbow.  08/15/2022: Completes 90% of the task, proximal assist at times required at the elbow. 07/20/22: completes 90% of task, limited by shoulder flexion. 05/25/2022:  Pt. Is able to initiate and perform oral care for approximately 90% of the task. Complete set-up required. Assi needed only for the very back teeth. 03/23/2022: Pt. Is able to initiate and perform oral care for approximately 75% of the task. Pt. Requires assist at proximally at the elbow for through oral care. 03/21/2022: Pt. Is able to initiate and perform oral care for approximately 75% of the task. Pt. Requires assist at proximally at the elbow for through oral  care. Eval: Pt. is able to initiate using an electric toothbrush. Pt. requires assist for set-up, and assist for thoroughness, and as he Pt. fatigues. Goal status:  Partially met, Goal discontinued  3.  Pt. Will be modified independence with self-feeding for 100% using a swivel spoon, and standard fork after complete set-up Baseline: 05/10/2023: Pt. feeds self 95-100% of the meal. with occasional assist to reposition the utensil. 03/08/23: indep with cup with a handle and lid, assist to reposition eating utensils in hand;  02/13/23:  Pt. Is now able to consistently, and efficiently use a swivel spoon for eating cereal with the left hand.01/09/2023: Continue 12/28/2022: Pt. Is now feeding himself 90% of the time using a swivel spoon with the left hand, and 95% of the time with the fork. Pt. Continues to have difficulty with cutting food. 11/16/2022: 100% with finger foods, Pt. Is initiating with a swivel spoon, and universal cuff for fork use, however requires assist for consistency, and accuracy.  09/26/2022: 90% using the spoon, assist at time with the forearm motion, and grip on the utensils. 100% for finger foods.  08/15/2022:  90% using the spoon, assit at time with the forearm motion, and grip on the utensils. 100% for finger foods-independent with set-up- unsupervised.  07/20/22: self-feeds cereal using spoon 90% of task. 05/25/2022: Pt. Is able to use a spoon to scoop cereal when feeding himself cereal 85% of the time. Pt. Is able to feed himself snack/finger foods 100% of the time. Pt. Continues to work on consistency of  stabilizing a cup/mug when drinking. Pt. Is able to grasp a water  bottle with assist initially, with assist tapering off as he drinks.03/23/2022: Pt. Is able to perform scooping cereal for 75% of the time. Pt. required assist, and support at the left elbow, and Pt. Presents with  limited forearm supination when using the spoon, and bringing it towards his mouth. Pt. Is able to use a fork to  spear items, and perform the hand to mouth pattern.  03/21/2022: Pt. Is able to perform scooping cereal for 75% of the time. Pt. required assist, and support at the left elbow, and Pt. presents with limited forearm supination when using the spoon, and bringing it towards his mouth. Pt. Is able to use a fork to spear items, and perform the hand to mouth pattern.  Eval: Pt. is able to hold standard standard utensils. Pt. Performs as much of the task as he, can and has assistance for the remainder. Goal status:  Improved, Achieved  4.  Pt. will improve grasp patterns and consistently grasp 1/4" objects for ADL, and IADL tasks.  Baseline:08/07/23: Continue 05/31/2023: Continue 05/10/23: Pt. is consistently able to grasp 1/2" objects with visual cues. Pt. Requires assist, and increased visual cues to grasp 1/4" objects on an elevated plane.  Pt. Is unable to grasp the flat at the tabletop surface. Pt. Is grasping grasping small puppy vitamins.03/08/23: 1/2" objects efficiently; 02/13/2023: 1/2" objects efficiently 01/09/2023: Continue 12/28/2022: Continues to grasp 1/4" pegs with min a + visual cues, consistently grasping 1/2" objects with visual cues 11/16/2022: Continues to grasp 1/4" pegs with min a + visual cues, consistently grasping 1/2" objects with visual cues 09/26/2022: Continues to grasp 1/4" pegs with min a + visual cues, consistently grasping 1/2" objects with visual cues. 08/15/2022: grasps 1/4" pegs with min a + visual cues, consistently grasping 1/2" objects with visual cues. 07/20/22: grasps 1/4" pegs with min a + visual cues, consistently grasping 1/2" objects with visual cues. 05/25/2022: Pt. Is working on improving consistency of grasping 1/2" objects with visual cues.  03/23/2022: Pt. Is able to grasp 1" objects consistently,and continues to work on the hand patterns needed to grasp 1/2" objects.03/21/2022: Pt. Is able to grasp 1" objects consistently,and continues to work on the hand patterns needed to  grasp 1/2" objects. Eval: Pt. is able to grasp 1" objects intermittently using a lateral grasp pattern. Goal status: Ongoing  5.  Pt. will independently write his name legibly with letter sizes under 1". Baseline: 05/10/23: Deferred 03/08/23: Not addressed this period; 02/13/2023: Continue 01/09/2023: Continue. 12/28/2022: Pt. is now using an IPad Pen. 11/16/2022: Pt. Continues to be able to write name with smaller letter size. Pt. Is signing name with a computer styus. Pt. Requires visual cues, and assist 09/26/2022: Pt. Continues to be able to write name with smaller letter size. Pt. Is signing name with a computer styus. Pt. Requires visual cues, and assist. 08/15/2022: Pt. Is able to write name with smaller letter size. Pt. Is signing name with a computer styus. Pt. Requires visual cues, and assist. 07/20/22: stabilizing assist to write name with 75% legibility with 2" letters. 05/25/2022: Pt. to continue to work towards formulating grasp patterns in preparation for grasping a large width pen. Pt. Requires visual cues. 03/23/2022: Pt. Is able to write his name with modA, however has difficulty with formulating letter sizes less than 2" in size with 50% legibility for the 3 letters of his name.03/21/2022: Pt. Is able to write his name with modA, however has difficulty with formulating letter sizes less than 2" in size with 50% legibility for the 3 letters of his name. Eval: Pt is able to hold a thin marker with his left hand, and formulate a line, and initiate a circular pattern (Pt. without glasses today) Goal  status: Deferred  6. Pt. Will reach up to comb/brush his hair with minA.  Baseline: 05/10/23: Pt. is able to use a pick independently with the left hand with occasional assist to the far side of the left his head. 03/08/23: Pt uses pick in L hand to reach 75% of his head; unable to reach R side of head; 02/13/2023:Pt. is able to use a hair pick with the left hand on the right side of his head, top, and back  of the head. Pt. Continues to have difficulty reaching further to the right side of his head with the left. 01/09/2023: Continue 12/28/2022: Pt. Is progressing, and is now able to use a hair pick with the left hand on the right side of his head, top, and back of the head. Pt. Continues to have difficulty reaching further to the right side of his head with the left. 11/16/2022: Pt. Is able to use a hair pick for the left side of his head using his left hand. Pt. Presents with difficulty reaching to the right side of his head.09/26/2022: Pt. Is able to use a hair pick for the left side of his head using his left hand. Pt. Presents with difficulty reaching to the right side of his head. 5/13/202: 75% using a hair pick, assist to the right side of the head. 07/20/22: reaches 75% of head, assist for far R side of head, fatigues quickly. 05/25/2022: Pt. Is able to reach up with the left hand to the left side, top, and back of his head. 03/23/2022: Pt. is now able to more consistently initiate reaching up to his head with his left hand in preparation for haircare12/18/2023: Pt. is now able to more consistently initiate reaching up to his head with his left hand in preparation for haircare. Eval: Pt. is able to initiate reaching up for hair care with a long handled brush, however is unable to sustain UE's in elevation to perform the task.     Goal status: Achieved  7. Pt. Will independently navigate the w/c through his environment with minA with visual scanning, and hand placement on the controls.  Baseline: 11/16/2022: Deferred 2/2 vision 09/26/2022: Pt. Is now navigating his w/c ouside the home, and around his block. 08/15/2022: Pt. Requires minA to setup hand on controls and MIN cues to navigate the w/c in wide spaces, requires MIN - MOD cues to navigate the w/c through more narrow doorways, and tighter turns - varies based on good vs bad vision days.07/20/22: Pt. Requires minA to setup hand on controls and MIN cues to  navigate the w/c in wide spaces, requires MIN - MOD cues to navigate the w/c through more narrow doorways, and tighter turns - varies based on good vs bad vision days. 05/25/2022: Pt. Requires minA  and  cues to navigate the w/c in wide spaces, and requires Mod cues to navigate the w/c through more narrow doorways, and tighter turns. Pt. Requires max cues for scanning through the environment, and moderate cues for hand placement on the controls.     Goal status: Deferred 2/2 vision    8. Pt. Will improve bilateral grip strength to be able to independently grasp, and pull up blankets, and linens.   Baseline: 05/10/23: Deferred 03/08/23: R grip 0-5 lbs, L grip 8 lbs; pt reports he can consistently pull up blankets and linens independently.  02/13/2023: Continue. Pt. presents with digit PIP, and DIP flexor tightness. 01/09/2023: Continue 12/28/2023: R: 0#, L: 5# Pt. Is now able  to more consistently grasp and pull blankets up over him. 11/16/2022: R: 0 L: 8# Pt. Is more consistently grasping his blankets and attempting to pull them up. 09/26/2022: R: 0  L: 8# Pt. is attempting to grasp, and pull blankets up more, using mostly the left hand. 08/15/2022: Pt. has difficulty securely holding, and pulling up blankets, and linens.    Goal status:Deferred  9. Pt. will consistently actively control the releasing of blankets, covers, and linens from his hands once they are in the desired position over him. Baseline:12/28/2022: Pt. Is consistently actively able to release linens once they are in the desired position over him. 11/16/2022: Pt. continues to improve with actively releasing blankets form his hands. 09/26/2022: Pt. is improving with actively releasing blankets form his hands. 08/15/2022: Pt. has difficulty with controlled releasing of blankets/linens from his grasp.     Goal status: Achieved    10. Pt. Will improve bilateral UE strength by 2 MM grades to assist with ADLs, and IADLs  Baseline: 08/07/23: Continue  05/31/2023: Continue 05/10/23: Right: shoulder flexion: 3+/5, abduction: 44-/5, elbow flexion: 4+/5 , extension: 4-/5 wrist extension: 2/5; Left: shoulder flexion: 4+/5, abduction: 4+/5, elbow flexion: 5/5 , extension: 4/5  wrist extension: 3/5 03/13/2023: Right: shoulder flexion: 3/5, abduction: 4/5, elbow flexion: 4+/5 , extension: 4-/5 wrist extension: 2/5; Left: shoulder flexion: 4+/5, abduction: 4+/5, elbow flexion: 5/5 , extension: 4/5  wrist extension: 3/5 03/08/23: NT this date d/t as pt arrived late; 02/13/2023: Continue 01/09/2023: Continue 12/28/2022: Right: shoulder flexion: 3/5, abduction: 4/5, elbow flexion: 4+/5 , extension: 4-/5 wrist extension: 2/5; Left: shoulder flexion: 4+/5, abduction: 4+/5, elbow flexion: 5/5 , extension: 4/5  wrist extension: 3/5 Right: shoulder flexion: 3+/5, abduction: 4/5, elbow flexion: 4+/5 , extension: 4+/5 wrist extension: 2/5; Left: shoulder flexion: 4+/5, abduction: 4+/5, elbow flexion: 5/5 , extension: 4+/5 wrist extension: 3-/5 11/16/2022: Right: shoulder flexion: 3/5, abduction: 4/5, elbow flexion: 4+/5 , extension: 4+/5 wrist extension: 2/5; Left: shoulder flexion: 4+/5, abduction: 4+/5, elbow flexion: 5/5 , extension:  wrist extension: 3-/5 Right: shoulder flexion: 3+/5, abduction: 4/5, elbow flexion: 4+/5 , extension: 4+/5 wrist extension: 2/5; Left: shoulder flexion: 4+/5, abduction: 4+/5, elbow flexion: 5/5 , extension: 4+/5 wrist extension: 3-/5 Goal status: Ongoing  11. Pt. will improve right wrist extension by 10 degrees to initiate reaching for items in preparation for ADLs. Baseline: 08/07/23: Continue 05/31/23: Continue 05/10/23: Right wrist extension: -32(22) 03/13/23: -22(35) 03/08/23: NT this date as pt arrived late; 02/13/2023: Continue 01/09/2023: Continue 12/28/2022: -22(34) 11/16/2022: -30(10)    Goal status: Ongoing      12. Pt. Will require donn/doff a Tank top T-shirt efficiently with supervision and full set-up.    Baseline: 08/07/23: Continue  05/31/2023: Pt. Requires maxA donning a pullover sweatshirt, Mod-maxA doffing it. T-shirt tank TBD 05/10/23: Pt. Presents with difficulty completing, and requires increased time to complete    Goal status: Ongoing    13. Pt. Will will independently perform bilateral/bimanual hand tasks needed to fold towels/linens/laundry Baseline: 08/07/23: Continue 05/31/23: Continue 05/10/23:  Pt. Has difficulty folding towels/lines/laundry using bilateral hands Goal status:Ongoing  14. Pt. Will independently, and efficiently reach his right arm over the side of the chair to pet his dog.    Baseline: 08/07/23: Continue 05/31/2023: Continue 05/10/23: Pt. Is unable to efficiently reach his right UE over the side of his recliner chair to pet his dog.    Goal status: Ongoing    15. Pt. Will independently, and efficiently reach out in front of him to  efficiently place items onto his table while sitting in his recliner.     Baseline: 08/07/23: Continue 2/26/225: Continue 05/10/23: Pt. has difficulty reaching out in front of him far enough to efficiently place items on the table while sitting in a recliner chair.    Goal status: Ongoing   ASSESSMENT:  CLINICAL IMPRESSION:    Pt. is progressing, and is able to move through abduction with support tapered down when reaching through multiple planes with the RUE. Pt. continues to present with tightness through the pectoral region, the RUE, right forearm supination, and bilateral digit PIP, and DIP flexion limiting grasp patterns. Pt. was able to achieve increased digit MP extension bilaterally when anticipating grasp, and during the of release objects, although requires cues to avoid MP hyperextension. Pt. continues to be highly motivated, and Pt.'s caregivers are very supportive to continue working towards improving BUE functioning, ROM, strength, motor control, [facility] skills, as well as visual compensatory strategies in order to maximize independence with daily ADLs, and IADL  functioning.      PERFORMANCE DEFICITS in functional skills including ADLs, IADLs, coordination, dexterity, proprioception, ROM, strength, pain, FMC, GMC, decreased knowledge of use of DME, and UE functional use, cognitive skills including safety awareness, and psychosocial skills including coping strategies, environmental adaptation, habits, and routines and behaviors.   IMPAIRMENTS are limiting patient from ADLs, IADLs, leisure, and social participation.   COMORBIDITIES may have co-morbidities  that affects occupational performance. Patient will benefit from skilled OT to address above impairments and improve overall function.  MODIFICATION OR ASSISTANCE TO COMPLETE EVALUATION: Maximum or significant modification of tasks or assist is necessary to complete an evaluation.  OT OCCUPATIONAL PROFILE AND HISTORY: Comprehensive assessment: Review of records and extensive additional review of physical, cognitive, psychosocial history related to current functional performance.  CLINICAL DECISION MAKING: High - multiple treatment options, significant modification of task necessary  REHAB POTENTIAL: Fair    EVALUATION COMPLEXITY: High    PLAN: OT FREQUENCY: 2 x's a week  OT DURATION:12 weeks  PLANNED INTERVENTIONS: self care/ADL training, therapeutic exercise, therapeutic activity, neuromuscular re-education, manual therapy, passive range of motion, patient/family education, and cognitive remediation/compensation RECOMMENDED OTHER SERVICES: PT  CONSULTED AND AGREED WITH PLAN OF CARE: Patient and family member/caregiver  PLAN FOR NEXT SESSION: Review HEP  Duey G[place], MS, OTR/L  08/21/23

## 2023-08-21 NOTE — Therapy (Signed)
 OUTPATIENT PHYSICAL THERAPY TREATMENT     Patient Name: Paul Hall MRN: [id number] DOB:1995/11/03, 28 y.o., male Today's Date: 08/21/2023  PCP:  Waunita Haff PROVIDER:  Susie Erichsen, PA-C   PT End of Session - 08/21/23 1541     Visit Number 158    Number of Visits 160   date corrected from most recent recert   Date for PT Re-Evaluation 08/23/23    Authorization Type [employer] COMM [employer]/ [place] Medicaid    Authorization Time Period 01/04/21-03/29/21; Recert 03/24/2021-06/16/2021; Recert 09/15/2021- 12/08/2021; Recert 12/13/2021-03/07/2022    Progress Note Due on Visit 160    PT Start Time 1616    PT Stop Time 1658    PT Time Calculation (min) 42 min    Equipment Utilized During Treatment Gait belt    Activity Tolerance Patient tolerated treatment well    Behavior During Therapy WFL for tasks assessed/performed                             Past Medical History:  Diagnosis Date   Diabetes mellitus (HCC)    Hypertension    Stroke (HCC)    Past Surgical History:  Procedure Laterality Date   IVC FILTER PLACEMENT ([facility] HX)     LEG SURGERY     PEG TUBE PLACEMENT     TRACHEOSTOMY     Patient Active Problem List   Diagnosis Date Noted   Sepsis due to vancomycin  resistant Enterococcus species (HCC) 06/06/2019   SIRS (systemic inflammatory response syndrome) (HCC) 06/05/2019   Acute lower UTI 06/05/2019   VRE (vancomycin -resistant Enterococci) infection 06/05/2019   Anemia 06/05/2019   Skin ulcer of sacrum with necrosis of muscle (HCC)    Urinary retention    Type 2 diabetes mellitus without complication, with long-term current use of insulin  (HCC)    Tachycardia    Lower extremity edema    Acute metabolic encephalopathy    Obstructive sleep apnea    Morbid obesity with BMI of 60.0-69.9, adult (HCC)    Goals of care, counseling/discussion    Palliative care encounter    Sepsis (HCC) 04/27/2019   H/O insulin  dependent diabetes mellitus 04/27/2019   History  of CVA with residual deficit 04/27/2019   Seizure disorder (HCC) 04/27/2019   Decubitus ulcer of sacral region, stage 4 (HCC) 04/27/2019    REFERRING DIAG: Cerebral infarction, unspecified   THERAPY DIAG:  Muscle weakness (generalized)  Other lack of coordination  Difficulty in walking, not elsewhere classified  Abnormality of gait and mobility  Unsteadiness on feet  Other abnormalities of gait and mobility  History of CVA with residual deficit  Rationale for Evaluation and Treatment Rehabilitation  PERTINENT HISTORY: Paul Hall is a 26yoM who presents with severe weakness, quadriparesis, altered sensorium, and visual impairment s/p critical illness and prolonged hospitalization. Pt hospitalized in October 2020 with ARDS 2/2 COVID19 infection. Pt sustained a complex and lengthy hospitalization which included tracheostomy, prolonged sedation, ECMO. In this period pt sustained CVA and SDH. Pt has now been liberated from tracheostomy and G-tube. Pt has since been hospitalized for wound infection and UTI. Pt lives with parents at home, has hospital bed and left chair, hoyer lift transfers, and power WC for mobility needs. Pt needs heavy physical assistance with ADL 2/2 BUE contractures and motor dysfunction   PRECAUTIONS: Fall  SUBJECTIVE:  Patient arrived in good spirits and reported feeling better mentally today. He arrived with his mom today and agreeable to  resume mat activities and attempting to stand .  PAIN:  Are you having pain? No. But feeling tight.    TODAY'S TREATMENT:  - 08/21/2023    *Patient was dependently transferred from power w/c to mat and later back from mat to power w/c (using personal hoyer pad) - hoyer lift   Therapeutic Activities: dynamic therapeutic activities designed to achieve improved functional performance  Seated knee ext (to assist with dynamic coordination of swinging LE and functional strength needed for sit to stand) 2 x 10  (active)   Seated hip flex 2 x 10 reps (AAROM)  Static sitting at [facility]- with patient concentrating on what did not feel right and attempting to adjust on his own. He was able to move each of LE to adjusted but required some assist with upper trunk Sit to stand x 3 trials with Max A +2 person today Environmental education officer on left side and mother on right) - functionally he was able to clear his bottom off mat but unable to stand erect Static stand x 3 trials with B knees blocked and max A+2 (forearm and trunk support). Patient was able to activate his glutes minimally to assist but unable to sustain- standing approx 20-30 sec each trial                    PATIENT EDUCATION: Education details: Exercise technique with VC   HOME EXERCISE PROGRAM:  Access Code: [email or web address] URL: [email or web address] Date: 03/23/2022 Prepared by: Ferrell Hu  Exercises - Supine Bridge  - 3 x weekly - 3 sets - 10 reps - 2 hold - Supine Gluteal Sets  - 3 x weekly - 3 sets - 10 reps - 5 sec hold - Supine Quad Set  - 1 x daily - 3 x weekly - 3 sets - 10 reps - 5 hold - Seated Long Arc Quad  - 1 x daily - 7 x weekly - 3 sets - 10 reps - Seated Hip Adduction Squeeze with Ball  - 1 x daily - 3 x weekly - 3 sets - 10 reps - 5 hold - Seated Hip Abduction  - 1 x daily - 3 x weekly - 3 sets - 10 reps - 2 hold    PT Short Term Goals -       PT SHORT TERM GOAL #1   Title Pt will be independent with HEP in order to improve strength and balance in order to decrease fall risk and improve function at home and work.    Baseline 01/04/2021= No formal HEP in place; 12/12 no HEP in place; 05/10/2021-Patient and his father were able to report compliance with curent HEP consisting of mostly seated/reclined LE strengthening. Both verbalize no questions at this time.    Time 6    Period Weeks    Status Achieved    Target Date 02/15/21            PT LONG TERM GOAL #1     Title Patient will increase BLE gross strength by  1/2 muscle grade to improve functional strength for improved independence with potential gait, increased standing tolerance and increased ADL ability.     Baseline 01/04/2021- Patient presents with 1/5 to 3-/5 B LE strength with MMT; 12/12: goal partially met for Left knee/hip; 05/10/2021= 2-/5 bilateal Hip flex; 3+/5 bilateral Knee ext; 06/21/2021= Patient presents with 2-/5 bilateral Hip flex; 3+/5 bilateral knee ext/flex; 2-/5 left ankle DF; 0/5 right ankle- and able to increase reps and resistance  with LE's. 09/15/2021- Patient technically presents with 2-/5 B hip flex/abd/add - but he is able to raise his hip up to approx 100 deg which has improved. 3+/5 Bilateral knee ext, 2-/5 left ankle and 0/5 right ankle.  12/08/2021= Patient able to lift left knee at 110 deg of hip flex; presents with 3+/5 knee ext, 2-/5 left ankle DF and 0/5 right ankle DF, 2-/5 bilateral Hip abd in seated position.    12/6: R: knee 3+/5 ext, 2/5 flexion, left knee 3+/5 extension, 3+/5 flexion, R hip: 2+/5 hip add, 2+/5 hip ABD L hip: 4-/5 hip ABD, 3+/5 hip ADD, 3+/5 hip flexion; 06/06/2022= Patient now presents with 2-/5 right ankle DF/PF;     Time 12     Period Weeks     Status MET    Target Date 03/07/2022         PT LONG TERM GOAL #2    Title Patient will tolerate sitting unsupported demonstrating erect sitting posture for 15 minutes with CGA to demonstrate improved back extensor strength and improved sitting tolerance.     Baseline 01/04/2021- Patient confied to sitting in lift chair or electric power chair with back support and unable to sit upright without physical assistance; 12/12: tolerates <1 minutes upright unsupported sitting. 05/10/2021=static sit with forward trunk lean  in his power wheelchair without back support x approx 3 min. 06/21/2021=Unable to assess today due to patient with acute back pain but on previous visit able to sit x 8 min without back support. 09/15/2021- on last visit- 09/13/2021- patient was able to sit  unsupported x 8 min at edge of mat. 10/13/2023 - Patient was able to sit at edge of mat with varying level of assist today from SBA to min A for a total of 20 min. 12/13/2021= Patient demonstrated unsupported sitting at edge of mat for approx 20 min    Time 12     Period Weeks     Status GOAL MET    Target Date 12/08/21          PT LONG TERM GOAL #3    Title Patient will demonstrate ability to perform static standing in // bars > 2 min with Max Assist  without loss of balance and fair posture for improved overall strength for pre-gait and transfer activities.     Baseline 01/04/2021= Patient current uanble to stand- Dependent on hoyer or sit to stand lift for transfers. 05/10/2021=Not appropriate yet- Currently still dependent with all transfers using hoyer. 06/21/2021= Patient continuing now to focus on LE strengthening to prepare for standing-unable to try today due to acute low back pain-  planning on attempting in new cert period. 09/15/2021- Patient has attempted standing 2x in past two week- max Assist of 2 people - only once was he successful to clearing his bottom from chair - Will continue to be a focus during the new certification. 12/13/2021= Patient has been limited secondary to increased overall low back pain during this certification and will require more time to focus on this goal.  12/6: not assessed this date, will assess at date when 2-3 PTs are present for assistance     Time 12     Period Weeks     Status GOAL not appropriate at this time - may attempt in future once Patient presents with improved overall LE strength.                 PT LONG TERM GOAL #4    Title Pt  will improve FOTO score by 10 points or more demonstrating improved perceived functional ability     Baseline FOTO 7 on 10/17; 03/15/21: FOTO 12; 05/10/2021 06/21/2021= 1; 09/15/2021= 9; 12/13/2021= Will issue next visit 12/6: 4; 06/06/2022= Will assess next visit; Goal not appropriate as patient is not ambulatory.     Time 12      Period Weeks     Status WITHDRAWN    Target date 11/30/2022         PT LONG TERM GOAL #5    Title Patient will perform sit to stand transfer with appropriate AD and max assist of 2 people with 75% consistency to prepare for pregait activities.     Baseline 09/15/2021= Patient unable to stand well- unable to clear his bottom off chair with Max assist of 2 persons. 12/13/2021- Goal not appropriate to try yet but will keep and roll over to next cert as shift continues to focus on transfers/standing; 4/24= Patient able to perform active ankle DF/PF with right LE and able to raise his knee into seated march and clear floor without physical assist today - as previously unable as well as lift right knee ext to near full ROM to improve strength for eventual standing. 09/07/2022- Goal continues to not be appropriate but patient is now standing some and will keep goal active; 11/23/2022= patient is now performing sit to stand with max A +2 and now able to clear his bottom off mat. 11/30/2022- Patient continues to perform sit to stand transfers with Max A+2 and able to clear bottom well off mat but not erect yet. 05/31/2023- Patient is able to stand consistently with max A +2 - unable to achieve full knee or trunk ext yet is able to clear bottom off mat for improved LE weightbearing.     Time 12     Period Weeks     Status PROGRESSING    Target date  08/23/2023       PT LONG TERM GOAL #6  Title Patient will tolerate sitting unsupported demonstrating erect sitting posture for 30 minutes with CGA to demonstrate improved back extensor strength and improved sitting tolerance.   Baseline 12/13/2021= Patient demonstrated unsupported sitting at edge of mat for approx 20 min;  12/29/2021- Patient performed approx 30 min of dynamic sitting activities today. 06/06/2022= Patient demonstrated ability to sit and perform static and dynamic UE/LE movement with only Supervision.   Time 12   Period Weeks   Status GOAL MET           7.   Patient will tolerate 2 minutes or more of standing in sit to stand lift or with max assist +2 in order to indicate improved lower extremity weightbearing tolerance for progression to standing in parallel bars. Baseline: 1 minute on most recent stand 02/21/22; 06/06/2022- Patient did attempt today after complaining of right LE pain last week. He attempted 3 stands using sit to stand lift. - all over 1 min- last one approx 48 sec- stopped due to fatigue. More erect standing in lift today - still poor gluteal strength but able to activate glutes and extend hips upon command but unable to hold > 5 sec. 07/28/2022- Will attempt standing next visit but patient has been able to stand since last progress note for up to 3 min at a time at his best. 09/07/2022-  attempted standing with max +2 and patient able to stand 15-20 sec so will now  revise goal; 11/23/2022- Patient at his best  between last 2 visits was able to stand approx 50 sec with max A+2.  11/30/2022- Patient performed 4 trials of static standing - max A +2- clearing bottom and using forearms to bear weight  03/15/2023: Patient performed 2 trials of static standing - max A +2- clearing edge of mat and using forearms to bear weight for approximately 45 seconds, is able to come into approximately 45 degrees of knee flexion from sit to stand; 05/31/2023- Patient has not attempted standing in 1 month but able to perform x 4 trials with max A +2 (PT and his mom) and able to clear his bottom off mat and attempted to activate his glutes and apply some forearm pressure - able to stand between 25-40 sec today.  Goal status: PROGRESSING Target date: 08/23/2023   8. Patient will tolerate sitting unsupported demonstrating ability to perform dynamic UE/LE activities for 30 minutes  independently to demonstrate ability to sit at edge of bed to eat or perform some ADL's/exercise for optimal quality of life.             Baseline: 06/06/2022- Patient able to sit and perform  some UE/LE exercises but requires CGA at times for safety- he is able to static sit for 30 min with supervision. 07/27/2022= Patient able to now sit > 30 min with Supervision only - performing static and dynamic activities. Will keep goal active to ensure patient is consistent. 09/07/2022=  Patient able to demonstrate consistent ability to sit at edge of mat during visits             Goal Status: MET             Target date: 08/29/2022        Plan     Clinical Impression Statement  Patient presents with good motivation for today's session. He was able to resume mat activities and able to resume standing for 1st time in > month. He was weak and fatigued overall but presented with good overall effort. Stressed importance of performing at home with parents as much as possible for any true carryover. Patient is up for recert next visit and asked patient and mother to think about any more goals that he had at home. Mother responded she would like to work some in supine on bed type mobility reporting he struggles some at home and requires increased assistance.   Pt will continue to benefit from skilled physical therapy intervention to address impairments, improve QOL, and attain therapy goals.    Personal Factors and Comorbidities Comorbidity 3+;Time since onset of injury/illness/exacerbation    Comorbidities CVA, diabetes, Seizures    Examination-Activity Limitations Bathing;Bed Mobility;Bend;Caring for Others;Carry;Dressing;Hygiene/Grooming;Lift;Locomotion Level;Reach Overhead;Self Feeding;Sit;Squat;Stairs;Stand;Transfers;Toileting    Examination-Participation Restrictions Cleaning;Community Activity;Driving;Laundry;Medication Management;Meal Prep;Occupation;Personal Finances;Shop;Yard Work;Volunteer    Stability/Clinical Decision Making Evolving/Moderate complexity    Rehab Potential Fair    PT Frequency 2x / week    PT Duration 12 weeks    PT Treatment/Interventions ADLs/Self Care Home  Management;Cryotherapy;Electrical Stimulation;Moist Heat;Ultrasound;DME Instruction;Gait training;Stair training;Functional mobility training;Therapeutic exercise;Balance training;Patient/family education;Orthotic Fit/Training;Neuromuscular re-education;Wheelchair mobility training;Manual techniques;Passive range of motion;Dry needling;Energy conservation;Taping;Visual/perceptual remediation/compensation;Joint Manipulations    PT Next Visit Plan core strength/motor control in short-sitting, sit to stand if able; Continue with progressive LE Strengthening. Stand activities as appropriate. Attempt Stedy (sit to stand device) in future visits. Recert next visit.    PT Home Exercise Plan No changes to HEP today    Consulted and Agree with Plan of Care Patient;Family member/caregiver    Family Member Consulted  03/09/2023, 8:04 AM      5:14 PM, 08/21/23 Murlene Army PT Physical Therapist - [facility]  5:14 PM 08/21/23

## 2023-08-23 ENCOUNTER — Ambulatory Visit: Payer: Medicare Other | Admitting: Occupational Therapy

## 2023-08-23 ENCOUNTER — Ambulatory Visit: Payer: Medicare Other

## 2023-08-23 DIAGNOSIS — R2681 Unsteadiness on feet: Secondary | ICD-10-CM

## 2023-08-23 DIAGNOSIS — R269 Unspecified abnormalities of gait and mobility: Secondary | ICD-10-CM

## 2023-08-23 DIAGNOSIS — I6930 Unspecified sequelae of cerebral infarction: Secondary | ICD-10-CM | POA: Diagnosis not present

## 2023-08-23 DIAGNOSIS — R262 Difficulty in walking, not elsewhere classified: Secondary | ICD-10-CM

## 2023-08-23 DIAGNOSIS — R278 Other lack of coordination: Secondary | ICD-10-CM

## 2023-08-23 DIAGNOSIS — R2689 Other abnormalities of gait and mobility: Secondary | ICD-10-CM

## 2023-08-23 DIAGNOSIS — M6281 Muscle weakness (generalized): Secondary | ICD-10-CM

## 2023-08-23 DIAGNOSIS — G4733 Obstructive sleep apnea (adult) (pediatric): Secondary | ICD-10-CM | POA: Diagnosis not present

## 2023-08-23 NOTE — Therapy (Signed)
 . Occupational Therapy Progress Note  Dates of reporting period  05/31/23   to   08/23/23  Patient Name: Paul Hall MRN: [id number] DOB:11-28-1995, 28 y.o., male Today's Date: 08/23/2023  PCP: Dr. Lewayne Records REFERRING PROVIDER: Dr. Lewayne Records    OT End of Session - 08/23/23 1646     Visit Number 90    Number of Visits 156    Date for OT Re-Evaluation 10/30/23    Authorization Type 09/26/2022    OT Start Time 1530    OT Stop Time 1615    OT Time Calculation (min) 45 min    Equipment Utilized During Treatment tilt in space power wc    Activity Tolerance Patient tolerated treatment well    Behavior During Therapy WFL for tasks assessed/performed                         Past Medical History:  Diagnosis Date   Diabetes mellitus (HCC)    Hypertension    Stroke [facility])    Past Surgical History:  Procedure Laterality Date   IVC FILTER PLACEMENT ([facility] HX)     LEG SURGERY     PEG TUBE PLACEMENT     TRACHEOSTOMY     Patient Active Problem List   Diagnosis Date Noted   Sepsis due to vancomycin  resistant Enterococcus species (HCC) 06/06/2019   SIRS (systemic inflammatory response syndrome) (HCC) 06/05/2019   Acute lower UTI 06/05/2019   VRE (vancomycin -resistant Enterococci) infection 06/05/2019   Anemia 06/05/2019   Skin ulcer of sacrum with necrosis of muscle (HCC)    Urinary retention    Type 2 diabetes mellitus without complication, with long-term current use of insulin  (HCC)    Tachycardia    Lower extremity edema    Acute metabolic encephalopathy    Obstructive sleep apnea    Morbid obesity with BMI of 60.0-69.9, adult (HCC)    Goals of care, counseling/discussion    Palliative care encounter    Sepsis (HCC) 04/27/2019   H/O insulin  dependent diabetes mellitus 04/27/2019   History of CVA with residual deficit 04/27/2019   Seizure disorder (HCC) 04/27/2019   Decubitus ulcer of sacral region, stage 4 (HCC) 04/27/2019   ONSET DATE: 01/2019  REFERRING DIAG:  CVA/COVID-19  THERAPY DIAG:  Muscle weakness (generalized)  Other lack of coordination  Rationale for Evaluation and Treatment Rehabilitation  SUBJECTIVE:   SUBJECTIVE STATEMENT:  Pt. reports doing well today.  Pt accompanied by: self and family member  PERTINENT HISTORY:  Pt. is a 28 y.o. male who was diagnosed with COVID-19, and CVA with resultant quadriplegia. Pt. Was then hospitalized with VRE UTI. PMHx includes: urinary retention, seizure disorder, obstructive sleep disorder, DM Type II, Morbid obesity.   PRECAUTIONS: None  WEIGHT BEARING RESTRICTIONS No  PAIN:  No reports of pain Are you having pain? No  FALLS: Has patient fallen in last 6 months? No  LIVING ENVIRONMENT: Lives with: lives with their family Lives in: House/apartment Stairs: No Level Entry Has following equipment at home: Wheelchair (power) and hoyer lift, sit to stand lift  PLOF: Independent  PATIENT GOALS: To be able to engage in more daily care tasks.  OBJECTIVE:   HAND DOMINANCE: Right  ADLs:  Transfers/ambulation related to ADLs: Eating: Pt. reports being able to hold standard utensils, and is starting to engage more in self-feeding tasks, hand to mouth patterns. Pt. Reports that he does as much as he can with the task, and family assists  with the remainder of the task. Grooming: Pt. Is able to initiate holding an electric toothbrush, and brush his teeth. Family assists LB Dressing: Total Assist UE dressing: Pt. is now able to reach up to actively assist with grasping , and pulling his gown down. Toileting:  Total Assist Bathing: MaxA UB, Total assist LB Tub Shower transfers: N/A Equipment: See above    IADLs: Shopping: Relies on family to assist Light housekeeping: Total Assist Meal Prep: Total Assist Community mobility:   Medication management:  Total Assist  Financial management: N/A Handwriting: Not legible: Pt. Is able to hold a pen with the left hand, and initiate marking  the page. Pt.'s eye glasses were not available  MOBILITY STATUS:  Power w/c  POSTURE COMMENTS:  Pt. Requires position changes in his power w/c  ACTIVITY TOLERANCE: Activity tolerance:  Fair  FUNCTIONAL OUTCOME MEASURES: FOTO: 40 TR score: 45  05/25/2022:   FOTO: 50 TR score: 45  07/20/22: FOTO 55  03/08/23: 51  UPPER EXTREMITY ROM     Active ROM Left eval Left  03/21/2022 Left 05/25/2022 L  07/20/22 Left 07/25/2022 Left  08/15/2022 Left  09/26/2022 Left  11/16/2022 Left 12/28/2022 Left 03/13/23 Left 05/10/23  Shoulder flexion 119 123 125 130  136 138 130 142 144 144  Shoulder abduction 110 115 115 115  118 121 125 142 140 140  Shoulder adduction             Shoulder extension             Shoulder internal rotation             Shoulder external rotation             Elbow flexion 135(135) 145 145 145  145    145 145  Elbow extension -27(-18) -21(-20) -20(-16)  -25(-10) -23(-10) -21(-10) -20(-18) -20(-18) -20(-17) -17(-16)  Wrist flexion             Wrist extension 10(50) 19(50) 28(50)  30(50) 35(55) 36(55) 36(60) 40(60) 45(60) 45(60)  Wrist ulnar deviation             Wrist radial deviation             Wrist pronation             Wrist supination   Limited by flexor tone       10(15)   (Blank rows = not tested)    Active ROM Right eval Right 03/21/2022 Right 05/25/2022 R  07/20/22 Right  07/25/22 Right 08/15/2022 Right 09/26/2022 Right 11/16/2022 Right 12/28/2022 Right 03/13/23 Right 05/10/23  Shoulder flexion 106 scaption 105  Scaption 62 flexion 110 scaption 100  105 105 105 112 Scaption 123 Scaption 122  Scaption  Shoulder abduction 114 90 97 100  105 117 120 124 124 122  Shoulder adduction             Shoulder extension             Shoulder internal rotation             Shoulder external rotation             Elbow flexion 120(130) 145 145 140  144 140 142 148 148 145  Elbow extension -45(-35) -22(-35) -28(-22)  -32(-21) -32(-28) -28(-26) -28(-28) -27(-26)  -27(-40) -38(-35)  Wrist flexion             Wrist extension -30(10) -25(10) -10(30)  -10(20) -10(25) -20(40) -30(10) -22(34) -22(35) -32(22)  Wrist  ulnar deviation             Wrist radial deviation             Wrist pronation             Wrist supination   Limited by flexor tone            12/28/2022: Thumb radial abduction: Right: 12(46), Left: 40(54)  Digits: Bilateral 2nd through 5th digit: MP Hyperextension with PIP, an DIP flexion     UPPER EXTREMITY MMT:     Left eval Left 03/21/2022 Left  05/25/2022 L 07/20/22 Left  08/15/2022 Left 09/26/2022 Left 11/16/2022 Left 12/28/2022 Left  03/13/2023 Left  05/10/23  Shoulder flexion 3-/5 3/5 3+/5 4+/5 4+/5 4+/5 4+/5 4+/5 4+/5 4+/5  Shoulder abduction 3-/5 3-/5 4-/5 4+/5 4+/5 4+5 4+/5 4+/5 4+/5 4+/5  Shoulder adduction            Shoulder extension            Shoulder internal rotation            Shoulder external rotation            Elbow flexion 3/5 3+/5 4-/5 4+/5 5/5 5/5 5/5 5/5 5/5 5/5  Elbow extension 2-/5 2/5  4+/5 4+/5 4+/5  4/5 4/5 4/5 through available range  Wrist flexion            Wrist extension 2/5 2+/5 3-/5  3-/5 3-/5 3-/5 3/5 3/5 3/5  Wrist ulnar deviation            Wrist radial deviation            Wrist pronation            Wrist supination            (Blank rows = not tested)  Right eval Right 03/21/2022 Right  05/25/2022 R 07/20/22 Right 08/15/2022 Right 09/26/2022 Right 11/16/2022 Right 12/28/2022 Right 03/13/2023 Right 05/10/23  Shoulder flexion 3-/5 scaption 3-/5 3-/5 3+/5 3+/5 3+/5 3/5 3/5 3/5 3+/5  Shoulder abduction 3-/5 3-/5 3-/5 4/5 4/5 4/5 4/5 4/5 4/5 4/-5  Shoulder adduction            Shoulder extension            Shoulder internal rotation            Shoulder external rotation            Elbow flexion 3/5 3/5 3+/5 4/5 4+/5 4+/5 4+/5 4+/5 4+/5 4+/5  Elbow extension 2-/5 2/5 3-/5 4+/5 4+/5 4+/5 4+/5 4-/5 4-/5 4-/5  Wrist flexion            Wrist extension 2-/5 2-/5 2/5 2/5 2/5 2/5 2/5 2/5 2/5  2/5  Wrist ulnar deviation            Wrist radial deviation            Wrist pronation            Wrist supination              HAND FUNCTION:  Grip strength: Right: 0 lbs; Left: 0 lbs and Lateral pinch: Right: 5 lbs, Left: 2 lbs  03/21/2022: Lateral pinch: Right: 3.5 lbs, Left: 2 lbs  07/20/22: Grip strength: Right: 3 lbs; Left: 4 lbs and Lateral pinch: Right: 5 lbs, Left: 3 lbs  08/15/2022:  Grip strength: Right: 0 lbs; Left: 5 lbs and Lateral pinch: Right: 2 lbs, Left: 4 lbs  09/26/2022  Grip  strength: Right: 0 lbs; Left: 8 lbs and Lateral pinch: Right: 2 lbs, Left: 5 lbs  11/16/2022  Grip strength: Right: 0 lbs; Left: 8 lbs  9/25/20204  Grip strength: Right: 0 lbs; Left: 8 lbs  Grip strength: Right: 0 lbs; Left: 8 lbs   03/08/23:  Grip strength: Right: 0 lbs without PROM, 5 lbs after PROM, Left 8 lbs  05/10/23:  Grip strength: TBD  Bilateral digit PIP/DIP flexion contractures with MP hyperextension with attempts for AROM. Pt. is able to tolerate AROM to the bilateral digits at the initial evaluation however, has a history of pain in the digits.  COORDINATION: Eval: Pt. is unable to grasp 9-hole test pegs. Pt. is able to initiate grasping larger pegs, and is able to hold a pen in the left hand.  07/20/22: 2 min 36 seconds to remove 9 pegs from 9 hole peg test - cues to locate pegs 2/2 low vision. Pt. is able to initiate grasping larger pegs on R hand and is able to hold a pen in the left hand.  08/15/2022:    Vision, and sensation limiting accuracy of 9 hole peg test results. Pt. Was able to grasp and remove vertical pegs intermittently with cues.    05/10/23: TBD  SENSATION: Light touch: Impaired   EDEMA:  N/A  MUSCLE TONE: BUE flexor Spasticity  COGNITION Overall cognitive status: Continue to assess in functional context  VISION:   Subjective report: Pt. was not wearing glasses at the time of the initial eval.  Baseline vision: Vision is very limited.  Wears glasses all the time Visual history: History of impaired vision following CVA. Pt. Has received [facility] through [facility] vision [facility] program.   VISION ASSESSMENT: Impaired To be further assessed in functional context  PERCEPTION: Impaired   PRAXIS: Impaired: motor planning  OBSERVATIONS:  Pt reports being on Tramadol   TODAY'S TREATMENT  08/23/23   Therapeutic Ex.:  -Emphasis was placed on slow prolonged gentle passive stretching for RUE, forearm supination, bilateral wrist extension, wrist extension, bilateral digit MP, PIP, and DIP extension following moist heat, and left wrist flexion/extension, PIP/DIP flexion, and extension while heat was applied to the RUE. PROM was performed to decrease stiffness, and prepare the RUE for functional use. ROM was performed to the shoulder right elbow, forearm, wrist, and digits. PROM for bilateral digit MP, PIP, and DIP extension in preparation for placing them onto a flat surface.  Therapeutic Activities:  -Facilitated functional reaching with the right UE through multiple planes through shoulder flexion, crossing midline, and through abduction in preparation for controlled releasing of the  flat 1" discs, and Jumbo pegs from his hand onto the tabletop surface. -Assist was required proximally, with increased assist, and cues for shoulder abduction with pectoral stretching when reaching out to the side to place the objects.  -Assist, and cues were required for right elbow extension.       PATIENT EDUCATION: Education details: stretching, PROM, massage to the digits on the bilateral hands. Person educated: Patient and Parent Education method: Explanation Education comprehension: verbalized understanding  HOME EXERCISE PROGRAM:  Continue ongoing assessment, and continue to provide as needed.   GOALS: Goals reviewed with patient? Yes  SHORT TERM GOALS: Target date: 09/18/2023     To assess splint fit, and make  appropriate adjustments to promote good skin integrity through the palmar surface of the bilateral hands.  Baseline: 05/25/22: Goal currently met, however ongoing as needs to assess splint fit arise. 03/23/2022: Pt.  is wearing splints a couple of hours at night bilateral resting hand splints. 03/21/2022: Pt. is wearing splints a couple of hours at night bilateral resting hand splints. Goal status: Deferred   LONG TERM GOALS: Target date:10/30/2023      FOTO score will Improve by 2 points for Pt. perceived improvement with the assessment specific ADL/IADL tasks.  Baseline:  03/08/23: 51; 12/28/2022: FOTO score: 56; 11/16/2022: 52 09/27/2022: 51 07/20/2022: FOTO 55 05/25/2022: FOTO score: 50,  TR score: 45 Eval: FOTO score: 40,  TR score: 45 Goal status: Achieved   2.   Pt. will independently perform oral care for 100% of the task after complete set-up. Baseline: 05/10/23: Pt. Performs 90% of the task. Pt.'s mother assists with 10% of the task for thorough cleaning the hard to reach places way in the back of the oral cavity.03/08/23: not as much assist required proximally, but to adjust positioning of toothbrush; 02/13/23: Pt. Continues to require assist proximally 01/09/2023: Continue 12/28/2022:  Pt. Continues to complete 90% of the task with complete set-up, with visual cues, and occassional assist of the right elbow. 11/16/2022: Pt. Completes 90% of the task with complete set-up. 09/26/2022: Completes 90% of the task, proximal assist at times required at the elbow.  08/15/2022: Completes 90% of the task, proximal assist at times required at the elbow. 07/20/22: completes 90% of task, limited by shoulder flexion. 05/25/2022:  Pt. Is able to initiate and perform oral care for approximately 90% of the task. Complete set-up required. Assi needed only for the very back teeth. 03/23/2022: Pt. Is able to initiate and perform oral care for approximately 75% of the task. Pt. Requires assist at proximally at the elbow for  through oral care. 03/21/2022: Pt. Is able to initiate and perform oral care for approximately 75% of the task. Pt. Requires assist at proximally at the elbow for through oral care. Eval: Pt. is able to initiate using an electric toothbrush. Pt. requires assist for set-up, and assist for thoroughness, and as he Pt. fatigues. Goal status:  Partially met, Goal discontinued  3.  Pt. Will be modified independence with self-feeding for 100% using a swivel spoon, and standard fork after complete set-up Baseline: 05/10/2023: Pt. feeds self 95-100% of the meal. with occasional assist to reposition the utensil. 03/08/23: indep with cup with a handle and lid, assist to reposition eating utensils in hand;  02/13/23:  Pt. Is now able to consistently, and efficiently use a swivel spoon for eating cereal with the left hand.01/09/2023: Continue 12/28/2022: Pt. Is now feeding himself 90% of the time using a swivel spoon with the left hand, and 95% of the time with the fork. Pt. Continues to have difficulty with cutting food. 11/16/2022: 100% with finger foods, Pt. Is initiating with a swivel spoon, and universal cuff for fork use, however requires assist for consistency, and accuracy.  09/26/2022: 90% using the spoon, assist at time with the forearm motion, and grip on the utensils. 100% for finger foods.  08/15/2022:  90% using the spoon, assit at time with the forearm motion, and grip on the utensils. 100% for finger foods-independent with set-up- unsupervised.  07/20/22: self-feeds cereal using spoon 90% of task. 05/25/2022: Pt. Is able to use a spoon to scoop cereal when feeding himself cereal 85% of the time. Pt. Is able to feed himself snack/finger foods 100% of the time. Pt. Continues to work on consistency of  stabilizing a cup/mug when drinking. Pt. Is able to grasp a water  bottle with  assist initially, with assist tapering off as he drinks.03/23/2022: Pt. Is able to perform scooping cereal for 75% of the time. Pt. required  assist, and support at the left elbow, and Pt. Presents with limited forearm supination when using the spoon, and bringing it towards his mouth. Pt. Is able to use a fork to spear items, and perform the hand to mouth pattern.  03/21/2022: Pt. Is able to perform scooping cereal for 75% of the time. Pt. required assist, and support at the left elbow, and Pt. presents with limited forearm supination when using the spoon, and bringing it towards his mouth. Pt. Is able to use a fork to spear items, and perform the hand to mouth pattern.  Eval: Pt. is able to hold standard standard utensils. Pt. Performs as much of the task as he, can and has assistance for the remainder. Goal status:  Improved, Achieved  4.  Pt. will improve grasp patterns and consistently grasp 1/4" objects for ADL, and IADL tasks.  Baseline: 08/27/23: Pt. is able to grasp flat discs, and sustain a secure hold on them while moving the UE through various planes. 08/07/23: Continue 05/31/2023: Continue 05/10/23: Pt. is consistently able to grasp 1/2" objects with visual cues. Pt. Requires assist, and increased visual cues to grasp 1/4" objects on an elevated plane.  Pt. Is unable to grasp the flat objects at the tabletop surface. Pt. Is grasping grasping small puppy vitamins.03/08/23: 1/2" objects efficiently; 02/13/2023: 1/2" objects efficiently 01/09/2023: Continue 12/28/2022: Continues to grasp 1/4" pegs with min a + visual cues, consistently grasping 1/2" objects with visual cues 11/16/2022: Continues to grasp 1/4" pegs with min a + visual cues, consistently grasping 1/2" objects with visual cues 09/26/2022: Continues to grasp 1/4" pegs with min a + visual cues, consistently grasping 1/2" objects with visual cues. 08/15/2022: grasps 1/4" pegs with min a + visual cues, consistently grasping 1/2" objects with visual cues. 07/20/22: grasps 1/4" pegs with min a + visual cues, consistently grasping 1/2" objects with visual cues. 05/25/2022: Pt. Is working on  improving consistency of grasping 1/2" objects with visual cues.  03/23/2022: Pt. Is able to grasp 1" objects consistently,and continues to work on the hand patterns needed to grasp 1/2" objects.03/21/2022: Pt. Is able to grasp 1" objects consistently,and continues to work on the hand patterns needed to grasp 1/2" objects. Eval: Pt. is able to grasp 1" objects intermittently using a lateral grasp pattern. Goal status: Ongoing  5.  Pt. will independently write his name legibly with letter sizes under 1". Baseline: 05/10/23: Deferred 03/08/23: Not addressed this period; 02/13/2023: Continue 01/09/2023: Continue. 12/28/2022: Pt. is now using an IPad Pen. 11/16/2022: Pt. Continues to be able to write name with smaller letter size. Pt. Is signing name with a computer styus. Pt. Requires visual cues, and assist 09/26/2022: Pt. Continues to be able to write name with smaller letter size. Pt. Is signing name with a computer styus. Pt. Requires visual cues, and assist. 08/15/2022: Pt. Is able to write name with smaller letter size. Pt. Is signing name with a computer styus. Pt. Requires visual cues, and assist. 07/20/22: stabilizing assist to write name with 75% legibility with 2" letters. 05/25/2022: Pt. to continue to work towards formulating grasp patterns in preparation for grasping a large width pen. Pt. Requires visual cues. 03/23/2022: Pt. Is able to write his name with modA, however has difficulty with formulating letter sizes less than 2" in size with 50% legibility for the 3 letters of his name.03/21/2022:  Pt. Is able to write his name with modA, however has difficulty with formulating letter sizes less than 2" in size with 50% legibility for the 3 letters of his name. Eval: Pt is able to hold a thin marker with his left hand, and formulate a line, and initiate a circular pattern (Pt. without glasses today) Goal status: Deferred  6. Pt. Will reach up to comb/brush his hair with minA.  Baseline: 05/10/23: Pt. is able  to use a pick independently with the left hand with occasional assist to the far side of the left his head. 03/08/23: Pt uses pick in L hand to reach 75% of his head; unable to reach R side of head; 02/13/2023:Pt. is able to use a hair pick with the left hand on the right side of his head, top, and back of the head. Pt. Continues to have difficulty reaching further to the right side of his head with the left. 01/09/2023: Continue 12/28/2022: Pt. Is progressing, and is now able to use a hair pick with the left hand on the right side of his head, top, and back of the head. Pt. Continues to have difficulty reaching further to the right side of his head with the left. 11/16/2022: Pt. Is able to use a hair pick for the left side of his head using his left hand. Pt. Presents with difficulty reaching to the right side of his head.09/26/2022: Pt. Is able to use a hair pick for the left side of his head using his left hand. Pt. Presents with difficulty reaching to the right side of his head. 5/13/202: 75% using a hair pick, assist to the right side of the head. 07/20/22: reaches 75% of head, assist for far R side of head, fatigues quickly. 05/25/2022: Pt. Is able to reach up with the left hand to the left side, top, and back of his head. 03/23/2022: Pt. is now able to more consistently initiate reaching up to his head with his left hand in preparation for haircare12/18/2023: Pt. is now able to more consistently initiate reaching up to his head with his left hand in preparation for haircare. Eval: Pt. is able to initiate reaching up for hair care with a long handled brush, however is unable to sustain UE's in elevation to perform the task.     Goal status: Achieved  7. Pt. Will independently navigate the w/c through his environment with minA with visual scanning, and hand placement on the controls.  Baseline: 11/16/2022: Deferred 2/2 vision 09/26/2022: Pt. Is now navigating his w/c ouside the home, and around his block.  08/15/2022: Pt. Requires minA to setup hand on controls and MIN cues to navigate the w/c in wide spaces, requires MIN - MOD cues to navigate the w/c through more narrow doorways, and tighter turns - varies based on good vs bad vision days.07/20/22: Pt. Requires minA to setup hand on controls and MIN cues to navigate the w/c in wide spaces, requires MIN - MOD cues to navigate the w/c through more narrow doorways, and tighter turns - varies based on good vs bad vision days. 05/25/2022: Pt. Requires minA  and  cues to navigate the w/c in wide spaces, and requires Mod cues to navigate the w/c through more narrow doorways, and tighter turns. Pt. Requires max cues for scanning through the environment, and moderate cues for hand placement on the controls.     Goal status: Deferred 2/2 vision    8. Pt. Will improve bilateral grip strength to be  able to independently grasp, and pull up blankets, and linens.   Baseline: 05/10/23: Deferred 03/08/23: R grip 0-5 lbs, L grip 8 lbs; pt reports he can consistently pull up blankets and linens independently.  02/13/2023: Continue. Pt. presents with digit PIP, and DIP flexor tightness. 01/09/2023: Continue 12/28/2023: R: 0#, L: 5# Pt. Is now able to more consistently grasp and pull blankets up over him. 11/16/2022: R: 0 L: 8# Pt. Is more consistently grasping his blankets and attempting to pull them up. 09/26/2022: R: 0  L: 8# Pt. is attempting to grasp, and pull blankets up more, using mostly the left hand. 08/15/2022: Pt. has difficulty securely holding, and pulling up blankets, and linens.    Goal status:Deferred  9. Pt. will consistently actively control the releasing of blankets, covers, and linens from his hands once they are in the desired position over him. Baseline:12/28/2022: Pt. Is consistently actively able to release linens once they are in the desired position over him. 11/16/2022: Pt. continues to improve with actively releasing blankets form his hands. 09/26/2022: Pt. is  improving with actively releasing blankets form his hands. 08/15/2022: Pt. has difficulty with controlled releasing of blankets/linens from his grasp.     Goal status: Achieved    10. Pt. Will improve bilateral UE strength by 2 MM grades to assist with ADLs, and IADLs  Baseline: 08/27/23:  Right: shoulder flexion: 3+/5, abduction: 4-/5, elbow flexion: 4+/5 , extension: 4-/5 wrist extension: 2/5; Left: shoulder flexion: 4+/5, abduction: 4+/5, elbow flexion: 5/5 , extension: 4/5  wrist extension: 3/5  08/07/23: Continue 05/31/2023: Continue 05/10/23: Right: shoulder flexion: 3+/5, abduction: 4-/5, elbow flexion: 4+/5 , extension: 4-/5 wrist extension: 2/5; Left: shoulder flexion: 4+/5, abduction: 4+/5, elbow flexion: 5/5 , extension: 4/5  wrist extension: 3/5 03/13/2023: Right: shoulder flexion: 3/5, abduction: 4/5, elbow flexion: 4+/5 , extension: 4-/5 wrist extension: 2/5; Left: shoulder flexion: 4+/5, abduction: 4+/5, elbow flexion: 5/5 , extension: 4/5  wrist extension: 3/5 03/08/23: NT this date d/t as pt arrived late; 02/13/2023: Continue 01/09/2023: Continue 12/28/2022: Right: shoulder flexion: 3/5, abduction: 4/5, elbow flexion: 4+/5 , extension: 4-/5 wrist extension: 2/5; Left: shoulder flexion: 4+/5, abduction: 4+/5, elbow flexion: 5/5 , extension: 4/5  wrist extension: 3/5 Right: shoulder flexion: 3+/5, abduction: 4/5, elbow flexion: 4+/5 , extension: 4+/5 wrist extension: 2/5; Left: shoulder flexion: 4+/5, abduction: 4+/5, elbow flexion: 5/5 , extension: 4+/5 wrist extension: 3-/5 11/16/2022: Right: shoulder flexion: 3/5, abduction: 4/5, elbow flexion: 4+/5 , extension: 4+/5 wrist extension: 2/5; Left: shoulder flexion: 4+/5, abduction: 4+/5, elbow flexion: 5/5 , extension:  wrist extension: 3-/5 Right: shoulder flexion: 3+/5, abduction: 4/5, elbow flexion: 4+/5 , extension: 4+/5 wrist extension: 2/5; Left: shoulder flexion: 4+/5, abduction: 4+/5, elbow flexion: 5/5 , extension: 4+/5 wrist extension:  3-/5 Goal status: Ongoing  11. Pt. will improve right wrist extension by 10 degrees to initiate reaching for items in preparation for ADLs. Baseline: 08/23/2023: R Wrist Ext: -31(-8)  08/07/23: Continue 05/31/23: Continue 05/10/23: Right wrist extension: -32(22) 03/13/23: -22(35) 03/08/23: NT this date as pt arrived late; 02/13/2023: Continue 01/09/2023: Continue 12/28/2022: -22(34) 11/16/2022: -30(10)    Goal status: Ongoing      12. Pt. Will require donn/doff a Tank top T-shirt efficiently with supervision and full set-up.    Baseline:08/23/23: Pt. Is able to independently doff his shirt, requiring assist to lean forward and loosen the back. 08/07/23: Continue 05/31/2023: Pt. Requires maxA donning a pullover sweatshirt, Mod-maxA doffing it. T-shirt tank TBD 05/10/23: Pt. Presents with difficulty completing, and requires  increased time to complete    Goal status: Ongoing    13. Pt. Will independently perform bilateral/bimanual hand tasks needed to fold towels/linens/laundry Baseline:08/23/23: Pt. Continues to have difficulty using bilateral/bimanual hand tasks needed to fold towels/linens/laundry. 08/07/23: Continue 05/31/23: Continue 05/10/23:  Pt. Has difficulty folding towels/lines/laundry using bilateral hands Goal status:Ongoing  14. Pt. Will independently, and efficiently reach his right arm over the side of the chair to pet his dog.    Baseline: 08/23/2023: Pt. Is now able to reach over the side of the armrest of his recliner chair to pet his dog.  08/07/23: Continue 05/31/2023: Continue 05/10/23: Pt. is unable to efficiently reach his right UE over the side of his recliner chair to pet his dog.    Goal status: Achieved    15. Pt. Will independently, and efficiently reach out in front of him to efficiently place items onto his table while sitting in his recliner.     Baseline:  08/23/2023: Pt. has difficulty consistently reaching out in front of him to place items on the on a tabletop surface with the right  hand. 08/07/23: Continue 2/26/225: Continue 05/10/23: Pt. has difficulty reaching out in front of him far enough to efficiently place items on the table while sitting in a recliner chair.    Goal status: Ongoing   ASSESSMENT:  CLINICAL IMPRESSION:    Pt. continues to make steady progress in OT with improving active right wrist extension, improving with reaching over the armrest of the chair to pet his dog. Pt. is now also able to reach up to pet the dog's head. Pt. is improving with UE dressing skills. Pt. continues to present with ROM, and limited functional reaching, however is now reaching out further with the RUE. Pt. Continues to work on improving bilateral/bimanual hand coordination skills. Pt. continues to be highly motivated, and Pt.'s caregivers are very supportive to continue working towards improving BUE functioning, ROM, strength, motor control, [facility] skills, as well as visual compensatory strategies in order to maximize independence with daily ADLs, and IADL functioning.      PERFORMANCE DEFICITS in functional skills including ADLs, IADLs, coordination, dexterity, proprioception, ROM, strength, pain, FMC, GMC, decreased knowledge of use of DME, and UE functional use, cognitive skills including safety awareness, and psychosocial skills including coping strategies, environmental adaptation, habits, and routines and behaviors.   IMPAIRMENTS are limiting patient from ADLs, IADLs, leisure, and social participation.   COMORBIDITIES may have co-morbidities  that affects occupational performance. Patient will benefit from skilled OT to address above impairments and improve overall function.  MODIFICATION OR ASSISTANCE TO COMPLETE EVALUATION: Maximum or significant modification of tasks or assist is necessary to complete an evaluation.  OT OCCUPATIONAL PROFILE AND HISTORY: Comprehensive assessment: Review of records and extensive additional review of physical, cognitive, psychosocial history  related to current functional performance.  CLINICAL DECISION MAKING: High - multiple treatment options, significant modification of task necessary  REHAB POTENTIAL: Fair    EVALUATION COMPLEXITY: High    PLAN: OT FREQUENCY: 2 x's a week  OT DURATION:12 weeks  PLANNED INTERVENTIONS: self care/ADL training, therapeutic exercise, therapeutic activity, neuromuscular re-education, manual therapy, passive range of motion, patient/family education, and cognitive remediation/compensation RECOMMENDED OTHER SERVICES: PT  CONSULTED AND AGREED WITH PLAN OF CARE: Patient and family member/caregiver  PLAN FOR NEXT SESSION: Review HEP  Duey G[place], MS, OTR/L  08/21/23

## 2023-08-24 NOTE — Therapy (Signed)
 OUTPATIENT PHYSICAL THERAPY TREATMENT     Patient Name: Paul Hall MRN: [id number] DOB:1995/07/03, 28 y.o., male Today's Date: 08/24/2023  PCP:  Waunita Haff PROVIDER:  Susie Erichsen, PA-C   PT End of Session - 08/24/23 0755     Visit Number 159    Number of Visits 160   date corrected from most recent recert   Date for PT Re-Evaluation 08/23/23    Authorization Type [employer] COMM [employer]/ [place] Medicaid    Authorization Time Period 01/04/21-03/29/21; Recert 03/24/2021-06/16/2021; Recert 09/15/2021- 12/08/2021; Recert 12/13/2021-03/07/2022    Progress Note Due on Visit 160    PT Start Time 1615    PT Stop Time 1700    PT Time Calculation (min) 45 min    Equipment Utilized During Treatment Gait belt    Activity Tolerance Patient tolerated treatment well    Behavior During Therapy WFL for tasks assessed/performed                             Past Medical History:  Diagnosis Date   Diabetes mellitus (HCC)    Hypertension    Stroke (HCC)    Past Surgical History:  Procedure Laterality Date   IVC FILTER PLACEMENT ([facility] HX)     LEG SURGERY     PEG TUBE PLACEMENT     TRACHEOSTOMY     Patient Active Problem List   Diagnosis Date Noted   Sepsis due to vancomycin  resistant Enterococcus species (HCC) 06/06/2019   SIRS (systemic inflammatory response syndrome) (HCC) 06/05/2019   Acute lower UTI 06/05/2019   VRE (vancomycin -resistant Enterococci) infection 06/05/2019   Anemia 06/05/2019   Skin ulcer of sacrum with necrosis of muscle (HCC)    Urinary retention    Type 2 diabetes mellitus without complication, with long-term current use of insulin  (HCC)    Tachycardia    Lower extremity edema    Acute metabolic encephalopathy    Obstructive sleep apnea    Morbid obesity with BMI of 60.0-69.9, adult (HCC)    Goals of care, counseling/discussion    Palliative care encounter    Sepsis (HCC) 04/27/2019   H/O insulin  dependent diabetes mellitus 04/27/2019   History  of CVA with residual deficit 04/27/2019   Seizure disorder (HCC) 04/27/2019   Decubitus ulcer of sacral region, stage 4 (HCC) 04/27/2019    REFERRING DIAG: Cerebral infarction, unspecified   THERAPY DIAG:  Muscle weakness (generalized)  Other lack of coordination  Difficulty in walking, not elsewhere classified  Abnormality of gait and mobility  Unsteadiness on feet  Other abnormalities of gait and mobility  History of CVA with residual deficit  Rationale for Evaluation and Treatment Rehabilitation  PERTINENT HISTORY: Paul Hall is a 26yoM who presents with severe weakness, quadriparesis, altered sensorium, and visual impairment s/p critical illness and prolonged hospitalization. Pt hospitalized in October 2020 with ARDS 2/2 COVID19 infection. Pt sustained a complex and lengthy hospitalization which included tracheostomy, prolonged sedation, ECMO. In this period pt sustained CVA and SDH. Pt has now been liberated from tracheostomy and G-tube. Pt has since been hospitalized for wound infection and UTI. Pt lives with parents at home, has hospital bed and left chair, hoyer lift transfers, and power WC for mobility needs. Pt needs heavy physical assistance with ADL 2/2 BUE contractures and motor dysfunction   PRECAUTIONS: Fall  SUBJECTIVE:  Patient states doing well today and denies any pain. Mom reports some difficulty at home with bed mobility and looking  for suggestions to see what Paul Hall can do.   PAIN:  Are you having pain? No. But feeling tight.    TODAY'S TREATMENT:  - 08/21/2023    *Patient was dependently transferred from power w/c to mat and later back from mat to power w/c (using personal hoyer pad) - hoyer lift   Therapeutic Activities: dynamic therapeutic activities designed to achieve improved functional performance  Supine- Bed mobility- hip abd/add with LE's- initially utilized slide board 2 x 10 but progressed to independently lifting LE's (all to assist with  crossing LE to assist mom with turning) 2 x 5 reps (increased time to perform yet no asisit)   Heel slides to assist with positioning feet into hooklye to bridge 2 x 10   Bridging 2x10 reps (PT assisted- holding right LE In neutral and pressure on each foot)    Scap protraction/ retraction - active x 10 reps to prepare for rolling.                   PATIENT EDUCATION: Education details: Exercise technique with VC   HOME EXERCISE PROGRAM:  Access Code: [email or web address] URL: [email or web address] Date: 03/23/2022 Prepared by: Ferrell Hu  Exercises - Supine Bridge  - 3 x weekly - 3 sets - 10 reps - 2 hold - Supine Gluteal Sets  - 3 x weekly - 3 sets - 10 reps - 5 sec hold - Supine Quad Set  - 1 x daily - 3 x weekly - 3 sets - 10 reps - 5 hold - Seated Long Arc Quad  - 1 x daily - 7 x weekly - 3 sets - 10 reps - Seated Hip Adduction Squeeze with Ball  - 1 x daily - 3 x weekly - 3 sets - 10 reps - 5 hold - Seated Hip Abduction  - 1 x daily - 3 x weekly - 3 sets - 10 reps - 2 hold    PT Short Term Goals -       PT SHORT TERM GOAL #1   Title Pt will be independent with HEP in order to improve strength and balance in order to decrease fall risk and improve function at home and work.    Baseline 01/04/2021= No formal HEP in place; 12/12 no HEP in place; 05/10/2021-Patient and his father were able to report compliance with curent HEP consisting of mostly seated/reclined LE strengthening. Both verbalize no questions at this time.    Time 6    Period Weeks    Status Achieved    Target Date 02/15/21            PT LONG TERM GOAL #1     Title Patient will increase BLE gross strength by 1/2 muscle grade to improve functional strength for improved independence with potential gait, increased standing tolerance and increased ADL ability.     Baseline 01/04/2021- Patient presents with 1/5 to 3-/5 B LE strength with MMT; 12/12: goal partially met for Left knee/hip;  05/10/2021= 2-/5 bilateal Hip flex; 3+/5 bilateral Knee ext; 06/21/2021= Patient presents with 2-/5 bilateral Hip flex; 3+/5 bilateral knee ext/flex; 2-/5 left ankle DF; 0/5 right ankle- and able to increase reps and resistance with LE's. 09/15/2021- Patient technically presents with 2-/5 B hip flex/abd/add - but he is able to raise his hip up to approx 100 deg which has improved. 3+/5 Bilateral knee ext, 2-/5 left ankle and 0/5 right ankle.  12/08/2021= Patient able to lift left knee at 110 deg of hip flex;  presents with 3+/5 knee ext, 2-/5 left ankle DF and 0/5 right ankle DF, 2-/5 bilateral Hip abd in seated position.    12/6: R: knee 3+/5 ext, 2/5 flexion, left knee 3+/5 extension, 3+/5 flexion, R hip: 2+/5 hip add, 2+/5 hip ABD L hip: 4-/5 hip ABD, 3+/5 hip ADD, 3+/5 hip flexion; 06/06/2022= Patient now presents with 2-/5 right ankle DF/PF;     Time 12     Period Weeks     Status MET    Target Date 03/07/2022         PT LONG TERM GOAL #2    Title Patient will tolerate sitting unsupported demonstrating erect sitting posture for 15 minutes with CGA to demonstrate improved back extensor strength and improved sitting tolerance.     Baseline 01/04/2021- Patient confied to sitting in lift chair or electric power chair with back support and unable to sit upright without physical assistance; 12/12: tolerates <1 minutes upright unsupported sitting. 05/10/2021=static sit with forward trunk lean  in his power wheelchair without back support x approx 3 min. 06/21/2021=Unable to assess today due to patient with acute back pain but on previous visit able to sit x 8 min without back support. 09/15/2021- on last visit- 09/13/2021- patient was able to sit unsupported x 8 min at edge of mat. 10/13/2023 - Patient was able to sit at edge of mat with varying level of assist today from SBA to min A for a total of 20 min. 12/13/2021= Patient demonstrated unsupported sitting at edge of mat for approx 20 min    Time 12     Period Weeks      Status GOAL MET    Target Date 12/08/21          PT LONG TERM GOAL #3    Title Patient will demonstrate ability to perform static standing in // bars > 2 min with Max Assist  without loss of balance and fair posture for improved overall strength for pre-gait and transfer activities.     Baseline 01/04/2021= Patient current uanble to stand- Dependent on hoyer or sit to stand lift for transfers. 05/10/2021=Not appropriate yet- Currently still dependent with all transfers using hoyer. 06/21/2021= Patient continuing now to focus on LE strengthening to prepare for standing-unable to try today due to acute low back pain-  planning on attempting in new cert period. 09/15/2021- Patient has attempted standing 2x in past two week- max Assist of 2 people - only once was he successful to clearing his bottom from chair - Will continue to be a focus during the new certification. 12/13/2021= Patient has been limited secondary to increased overall low back pain during this certification and will require more time to focus on this goal.  12/6: not assessed this date, will assess at date when 2-3 PTs are present for assistance     Time 12     Period Weeks     Status GOAL not appropriate at this time - may attempt in future once Patient presents with improved overall LE strength.                 PT LONG TERM GOAL #4    Title Pt will improve FOTO score by 10 points or more demonstrating improved perceived functional ability     Baseline FOTO 7 on 10/17; 03/15/21: FOTO 12; 05/10/2021 06/21/2021= 1; 09/15/2021= 9; 12/13/2021= Will issue next visit 12/6: 4; 06/06/2022= Will assess next visit; Goal not appropriate as patient is not ambulatory.  Time 12     Period Weeks     Status WITHDRAWN    Target date 11/30/2022         PT LONG TERM GOAL #5    Title Patient will perform sit to stand transfer with appropriate AD and max assist of 2 people with 75% consistency to prepare for pregait activities.     Baseline 09/15/2021= Patient  unable to stand well- unable to clear his bottom off chair with Max assist of 2 persons. 12/13/2021- Goal not appropriate to try yet but will keep and roll over to next cert as shift continues to focus on transfers/standing; 4/24= Patient able to perform active ankle DF/PF with right LE and able to raise his knee into seated march and clear floor without physical assist today - as previously unable as well as lift right knee ext to near full ROM to improve strength for eventual standing. 09/07/2022- Goal continues to not be appropriate but patient is now standing some and will keep goal active; 11/23/2022= patient is now performing sit to stand with max A +2 and now able to clear his bottom off mat. 11/30/2022- Patient continues to perform sit to stand transfers with Max A+2 and able to clear bottom well off mat but not erect yet. 05/31/2023- Patient is able to stand consistently with max A +2 - unable to achieve full knee or trunk ext yet is able to clear bottom off mat for improved LE weightbearing.     Time 12     Period Weeks     Status PROGRESSING    Target date  08/23/2023       PT LONG TERM GOAL #6  Title Patient will tolerate sitting unsupported demonstrating erect sitting posture for 30 minutes with CGA to demonstrate improved back extensor strength and improved sitting tolerance.   Baseline 12/13/2021= Patient demonstrated unsupported sitting at edge of mat for approx 20 min;  12/29/2021- Patient performed approx 30 min of dynamic sitting activities today. 06/06/2022= Patient demonstrated ability to sit and perform static and dynamic UE/LE movement with only Supervision.   Time 12   Period Weeks   Status GOAL MET           7.  Patient will tolerate 2 minutes or more of standing in sit to stand lift or with max assist +2 in order to indicate improved lower extremity weightbearing tolerance for progression to standing in parallel bars. Baseline: 1 minute on most recent stand 02/21/22; 06/06/2022- Patient  did attempt today after complaining of right LE pain last week. He attempted 3 stands using sit to stand lift. - all over 1 min- last one approx 48 sec- stopped due to fatigue. More erect standing in lift today - still poor gluteal strength but able to activate glutes and extend hips upon command but unable to hold > 5 sec. 07/28/2022- Will attempt standing next visit but patient has been able to stand since last progress note for up to 3 min at a time at his best. 09/07/2022-  attempted standing with max +2 and patient able to stand 15-20 sec so will now  revise goal; 11/23/2022- Patient at his best between last 2 visits was able to stand approx 50 sec with max A+2.  11/30/2022- Patient performed 4 trials of static standing - max A +2- clearing bottom and using forearms to bear weight  03/15/2023: Patient performed 2 trials of static standing - max A +2- clearing edge of mat and using forearms  to bear weight for approximately 45 seconds, is able to come into approximately 45 degrees of knee flexion from sit to stand; 05/31/2023- Patient has not attempted standing in 1 month but able to perform x 4 trials with max A +2 (PT and his mom) and able to clear his bottom off mat and attempted to activate his glutes and apply some forearm pressure - able to stand between 25-40 sec today.  Goal status: PROGRESSING Target date: 08/23/2023   8. Patient will tolerate sitting unsupported demonstrating ability to perform dynamic UE/LE activities for 30 minutes  independently to demonstrate ability to sit at edge of bed to eat or perform some ADL's/exercise for optimal quality of life.             Baseline: 06/06/2022- Patient able to sit and perform some UE/LE exercises but requires CGA at times for safety- he is able to static sit for 30 min with supervision. 07/27/2022= Patient able to now sit > 30 min with Supervision only - performing static and dynamic activities. Will keep goal active to ensure patient is consistent.  09/07/2022=  Patient able to demonstrate consistent ability to sit at edge of mat during visits             Goal Status: MET             Target date: 08/29/2022        Plan     Clinical Impression Statement  Patient presents today with good motivation for today's session. Treatment focused on bed mobility activities and patient demonstrated improving overall mobility- able to abd/add actively and able to cross his legs to assist with rolling. He was even able to bridge some and almost cleared his glutes off mat table. Pt will continue to benefit from skilled physical therapy intervention to address impairments, improve QOL, and attain therapy goals.    Personal Factors and Comorbidities Comorbidity 3+;Time since onset of injury/illness/exacerbation    Comorbidities CVA, diabetes, Seizures    Examination-Activity Limitations Bathing;Bed Mobility;Bend;Caring for Others;Carry;Dressing;Hygiene/Grooming;Lift;Locomotion Level;Reach Overhead;Self Feeding;Sit;Squat;Stairs;Stand;Transfers;Toileting    Examination-Participation Restrictions Cleaning;Community Activity;Driving;Laundry;Medication Management;Meal Prep;Occupation;Personal Finances;Shop;Yard Work;Volunteer    Stability/Clinical Decision Making Evolving/Moderate complexity    Rehab Potential Fair    PT Frequency 2x / week    PT Duration 12 weeks    PT Treatment/Interventions ADLs/Self Care Home Management;Cryotherapy;Electrical Stimulation;Moist Heat;Ultrasound;DME Instruction;Gait training;Stair training;Functional mobility training;Therapeutic exercise;Balance training;Patient/family education;Orthotic Fit/Training;Neuromuscular re-education;Wheelchair mobility training;Manual techniques;Passive range of motion;Dry needling;Energy conservation;Taping;Visual/perceptual remediation/compensation;Joint Manipulations    PT Next Visit Plan core strength/motor control in short-sitting, sit to stand if able; Continue with progressive LE Strengthening.  Stand activities as appropriate. Attempt Stedy (sit to stand device) in future visits. Recert next visit.    PT Home Exercise Plan No changes to HEP today    Consulted and Agree with Plan of Care Patient;Family member/caregiver    Family Member Consulted             03/09/2023, 8:04 AM      5:08 PM, 08/24/23 Murlene [employer] PT Physical Therapist - [facility]  5:08 PM 08/24/23

## 2023-08-30 ENCOUNTER — Ambulatory Visit: Payer: Medicare Other | Admitting: Occupational Therapy

## 2023-08-30 ENCOUNTER — Ambulatory Visit: Payer: Medicare Other

## 2023-09-04 ENCOUNTER — Ambulatory Visit: Payer: Medicare Other | Attending: Physician Assistant

## 2023-09-04 ENCOUNTER — Ambulatory Visit: Payer: Medicare Other | Admitting: Occupational Therapy

## 2023-09-04 DIAGNOSIS — R262 Difficulty in walking, not elsewhere classified: Secondary | ICD-10-CM | POA: Insufficient documentation

## 2023-09-04 DIAGNOSIS — R2689 Other abnormalities of gait and mobility: Secondary | ICD-10-CM | POA: Insufficient documentation

## 2023-09-04 DIAGNOSIS — M6281 Muscle weakness (generalized): Secondary | ICD-10-CM

## 2023-09-04 DIAGNOSIS — R278 Other lack of coordination: Secondary | ICD-10-CM | POA: Diagnosis not present

## 2023-09-04 DIAGNOSIS — R2681 Unsteadiness on feet: Secondary | ICD-10-CM | POA: Diagnosis not present

## 2023-09-04 DIAGNOSIS — R269 Unspecified abnormalities of gait and mobility: Secondary | ICD-10-CM | POA: Insufficient documentation

## 2023-09-04 DIAGNOSIS — I6930 Unspecified sequelae of cerebral infarction: Secondary | ICD-10-CM | POA: Diagnosis not present

## 2023-09-04 NOTE — Therapy (Signed)
 . Occupational Therapy Neuro Treatment Note  Patient Name: Tavish Gettis MRN: [id number] DOB:1995/08/15, 28 y.o., male Today's Date: 09/04/2023  PCP: Dr. Lewayne Records REFERRING PROVIDER: Dr. Lewayne Records    OT End of Session - 09/04/23 1703     Visit Number [id number]    Date for OT Re-Evaluation 10/30/23    OT Start Time 1536    OT Stop Time 1615    OT Time Calculation (min) 39 min    Equipment Utilized During Treatment tilt in space power wc    Activity Tolerance Patient tolerated treatment well    Behavior During Therapy WFL for tasks assessed/performed                         Past Medical History:  Diagnosis Date   Diabetes mellitus (HCC)    Hypertension    Stroke [facility])    Past Surgical History:  Procedure Laterality Date   IVC FILTER PLACEMENT ([facility] HX)     LEG SURGERY     PEG TUBE PLACEMENT     TRACHEOSTOMY     Patient Active Problem List   Diagnosis Date Noted   Sepsis due to vancomycin  resistant Enterococcus species (HCC) 06/06/2019   SIRS (systemic inflammatory response syndrome) (HCC) 06/05/2019   Acute lower UTI 06/05/2019   VRE (vancomycin -resistant Enterococci) infection 06/05/2019   Anemia 06/05/2019   Skin ulcer of sacrum with necrosis of muscle (HCC)    Urinary retention    Type 2 diabetes mellitus without complication, with long-term current use of insulin  (HCC)    Tachycardia    Lower extremity edema    Acute metabolic encephalopathy    Obstructive sleep apnea    Morbid obesity with BMI of 60.0-69.9, adult (HCC)    Goals of care, counseling/discussion    Palliative care encounter    Sepsis (HCC) 04/27/2019   H/O insulin  dependent diabetes mellitus 04/27/2019   History of CVA with residual deficit 04/27/2019   Seizure disorder (HCC) 04/27/2019   Decubitus ulcer of sacral region, stage 4 (HCC) 04/27/2019   ONSET DATE: 01/2019  REFERRING DIAG: CVA/COVID-19  THERAPY DIAG:  Muscle weakness (generalized)  Rationale for Evaluation and Treatment  Rehabilitation  SUBJECTIVE:   SUBJECTIVE STATEMENT:  Pt. Is requesting to be stretched.  Pt accompanied by: self and family member  PERTINENT HISTORY:  Pt. is a 28 y.o. male who was diagnosed with COVID-19, and CVA with resultant quadriplegia. Pt. Was then hospitalized with VRE UTI. PMHx includes: urinary retention, seizure disorder, obstructive sleep disorder, DM Type II, Morbid obesity.   PRECAUTIONS: None  WEIGHT BEARING RESTRICTIONS No  PAIN:  No reports of pain Are you having pain? No  FALLS: Has patient fallen in last 6 months? No  LIVING ENVIRONMENT: Lives with: lives with their family Lives in: House/apartment Stairs: No Level Entry Has following equipment at home: Wheelchair (power) and hoyer lift, sit to stand lift  PLOF: Independent  PATIENT GOALS: To be able to engage in more daily care tasks.  OBJECTIVE:   HAND DOMINANCE: Right  ADLs:  Transfers/ambulation related to ADLs: Eating: Pt. reports being able to hold standard utensils, and is starting to engage more in self-feeding tasks, hand to mouth patterns. Pt. Reports that he does as much as he can with the task, and family assists  with the remainder of the task. Grooming: Pt. Is able to initiate holding an electric toothbrush, and brush his teeth. Family assists LB Dressing: Total Assist UE dressing: Pt.  is now able to reach up to actively assist with grasping , and pulling his gown down. Toileting:  Total Assist Bathing: MaxA UB, Total assist LB Tub Shower transfers: N/A Equipment: See above    IADLs: Shopping: Relies on family to assist Light housekeeping: Total Assist Meal Prep: Total Assist Community mobility:   Medication management:  Total Assist  Financial management: N/A Handwriting: Not legible: Pt. Is able to hold a pen with the left hand, and initiate marking the page. Pt.'s eye glasses were not available  MOBILITY STATUS:  Power w/c  POSTURE COMMENTS:  Pt. Requires position  changes in his power w/c  ACTIVITY TOLERANCE: Activity tolerance:  Fair  FUNCTIONAL OUTCOME MEASURES: FOTO: 40 TR score: 45  05/25/2022:   FOTO: 50 TR score: 45  07/20/22: FOTO 55  03/08/23: 51  UPPER EXTREMITY ROM     Active ROM Left eval Left  03/21/2022 Left 05/25/2022 L  07/20/22 Left 07/25/2022 Left  08/15/2022 Left  09/26/2022 Left  11/16/2022 Left 12/28/2022 Left 03/13/23 Left 05/10/23  Shoulder flexion 119 123 125 130  136 138 130 142 144 144  Shoulder abduction 110 115 115 115  118 121 125 142 140 140  Shoulder adduction             Shoulder extension             Shoulder internal rotation             Shoulder external rotation             Elbow flexion 135(135) 145 145 145  145    145 145  Elbow extension -27(-18) -21(-20) -20(-16)  -25(-10) -23(-10) -21(-10) -20(-18) -20(-18) -20(-17) -17(-16)  Wrist flexion             Wrist extension 10(50) 19(50) 28(50)  30(50) 35(55) 36(55) 36(60) 40(60) 45(60) 45(60)  Wrist ulnar deviation             Wrist radial deviation             Wrist pronation             Wrist supination   Limited by flexor tone       10(15)   (Blank rows = not tested)    Active ROM Right eval Right 03/21/2022 Right 05/25/2022 R  07/20/22 Right  07/25/22 Right 08/15/2022 Right 09/26/2022 Right 11/16/2022 Right 12/28/2022 Right 03/13/23 Right 05/10/23  Shoulder flexion 106 scaption 105  Scaption 62 flexion 110 scaption 100  105 105 105 112 Scaption 123 Scaption 122  Scaption  Shoulder abduction 114 90 97 100  105 117 120 124 124 122  Shoulder adduction             Shoulder extension             Shoulder internal rotation             Shoulder external rotation             Elbow flexion 120(130) 145 145 140  144 140 142 148 148 145  Elbow extension -45(-35) -22(-35) -28(-22)  -32(-21) -32(-28) -28(-26) -28(-28) -27(-26) -27(-40) -38(-35)  Wrist flexion             Wrist extension -30(10) -25(10) -10(30)  -10(20) -10(25) -20(40) -30(10)  -22(34) -22(35) -32(22)  Wrist ulnar deviation             Wrist radial deviation  Wrist pronation             Wrist supination   Limited by flexor tone            12/28/2022: Thumb radial abduction: Right: 12(46), Left: 40(54)  Digits: Bilateral 2nd through 5th digit: MP Hyperextension with PIP, an DIP flexion     UPPER EXTREMITY MMT:     Left eval Left 03/21/2022 Left  05/25/2022 L 07/20/22 Left  08/15/2022 Left 09/26/2022 Left 11/16/2022 Left 12/28/2022 Left  03/13/2023 Left  05/10/23  Shoulder flexion 3-/5 3/5 3+/5 4+/5 4+/5 4+/5 4+/5 4+/5 4+/5 4+/5  Shoulder abduction 3-/5 3-/5 4-/5 4+/5 4+/5 4+5 4+/5 4+/5 4+/5 4+/5  Shoulder adduction            Shoulder extension            Shoulder internal rotation            Shoulder external rotation            Elbow flexion 3/5 3+/5 4-/5 4+/5 5/5 5/5 5/5 5/5 5/5 5/5  Elbow extension 2-/5 2/5  4+/5 4+/5 4+/5  4/5 4/5 4/5 through available range  Wrist flexion            Wrist extension 2/5 2+/5 3-/5  3-/5 3-/5 3-/5 3/5 3/5 3/5  Wrist ulnar deviation            Wrist radial deviation            Wrist pronation            Wrist supination            (Blank rows = not tested)  Right eval Right 03/21/2022 Right  05/25/2022 R 07/20/22 Right 08/15/2022 Right 09/26/2022 Right 11/16/2022 Right 12/28/2022 Right 03/13/2023 Right 05/10/23  Shoulder flexion 3-/5 scaption 3-/5 3-/5 3+/5 3+/5 3+/5 3/5 3/5 3/5 3+/5  Shoulder abduction 3-/5 3-/5 3-/5 4/5 4/5 4/5 4/5 4/5 4/5 4/-5  Shoulder adduction            Shoulder extension            Shoulder internal rotation            Shoulder external rotation            Elbow flexion 3/5 3/5 3+/5 4/5 4+/5 4+/5 4+/5 4+/5 4+/5 4+/5  Elbow extension 2-/5 2/5 3-/5 4+/5 4+/5 4+/5 4+/5 4-/5 4-/5 4-/5  Wrist flexion            Wrist extension 2-/5 2-/5 2/5 2/5 2/5 2/5 2/5 2/5 2/5 2/5  Wrist ulnar deviation            Wrist radial deviation            Wrist pronation            Wrist supination               HAND FUNCTION:  Grip strength: Right: 0 lbs; Left: 0 lbs and Lateral pinch: Right: 5 lbs, Left: 2 lbs  03/21/2022: Lateral pinch: Right: 3.5 lbs, Left: 2 lbs  07/20/22: Grip strength: Right: 3 lbs; Left: 4 lbs and Lateral pinch: Right: 5 lbs, Left: 3 lbs  08/15/2022:  Grip strength: Right: 0 lbs; Left: 5 lbs and Lateral pinch: Right: 2 lbs, Left: 4 lbs  09/26/2022  Grip strength: Right: 0 lbs; Left: 8 lbs and Lateral pinch: Right: 2 lbs, Left: 5 lbs  11/16/2022  Grip strength: Right: 0 lbs; Left: 8 lbs  9/25/20204  Grip strength: Right: 0 lbs; Left: 8 lbs  Grip strength: Right: 0 lbs; Left: 8 lbs   03/08/23:  Grip strength: Right: 0 lbs without PROM, 5 lbs after PROM, Left 8 lbs  05/10/23:  Grip strength: TBD  Bilateral digit PIP/DIP flexion contractures with MP hyperextension with attempts for AROM. Pt. is able to tolerate AROM to the bilateral digits at the initial evaluation however, has a history of pain in the digits.  COORDINATION: Eval: Pt. is unable to grasp 9-hole test pegs. Pt. is able to initiate grasping larger pegs, and is able to hold a pen in the left hand.  07/20/22: 2 min 36 seconds to remove 9 pegs from 9 hole peg test - cues to locate pegs 2/2 low vision. Pt. is able to initiate grasping larger pegs on R hand and is able to hold a pen in the left hand.  08/15/2022:    Vision, and sensation limiting accuracy of 9 hole peg test results. Pt. Was able to grasp and remove vertical pegs intermittently with cues.    05/10/23: TBD  SENSATION: Light touch: Impaired   EDEMA:  N/A  MUSCLE TONE: BUE flexor Spasticity  COGNITION Overall cognitive status: Continue to assess in functional context  VISION:   Subjective report: Pt. was not wearing glasses at the time of the initial eval.  Baseline vision: Vision is very limited. Wears glasses all the time Visual history: History of impaired vision following CVA. Pt. Has received treatment through  the [facility] low vision rehabilitation program.   VISION ASSESSMENT: Impaired To be further assessed in functional context  PERCEPTION: Impaired   PRAXIS: Impaired: motor planning  OBSERVATIONS:  Pt reports being on Tramadol   TODAY'S TREATMENT  09/04/23   Therapeutic Ex.:  -Emphasis was placed on slow prolonged gentle passive stretching for RUE, forearm supination, bilateral wrist extension, wrist extension, bilateral digit MP, PIP, and DIP extension following moist heat, and left wrist flexion/extension, PIP/DIP flexion, and extension while heat was applied to the RUE. PROM was performed to decrease stiffness, and prepare the RUE for functional use. ROM was performed to the shoulder right elbow, forearm, wrist, and digits. PROM for bilateral digit MP, PIP, and DIP extension in preparation for placing them onto a flat surface.  Therapeutic Activities:  -Facilitated functional reaching using a cuff weight with the right UE through multiple planes through shoulder flexion, crossing midline, and through abduction in preparation for controlled releasing of the  flat 1" discs, and Jumbo pegs from his hand onto the tabletop surface. -Assist was required proximally, with increased assist, and cues for shoulder abduction with pectoral stretching when reaching out to the side to place the objects.       PATIENT EDUCATION: Education details: stretching, PROM, massage to the digits on the bilateral hands. Person educated: Patient and Parent Education method: Explanation Education comprehension: verbalized understanding  HOME EXERCISE PROGRAM:  Continue ongoing assessment, and continue to provide as needed.   GOALS: Goals reviewed with patient? Yes  SHORT TERM GOALS: Target date: 09/18/2023     To assess splint fit, and make appropriate adjustments to promote good skin integrity through the palmar surface of the bilateral hands.  Baseline: 05/25/22: Goal currently met, however ongoing as needs  to assess splint fit arise. 03/23/2022: Pt. is wearing splints a couple of hours at night bilateral resting hand splints. 03/21/2022: Pt. is wearing splints a couple of hours at night bilateral resting hand splints. Goal status: Deferred   LONG TERM GOALS:  Target date:10/30/2023      FOTO score will Improve by 2 points for Pt. perceived improvement with the assessment specific ADL/IADL tasks.  Baseline:  03/08/23: 51; 12/28/2022: FOTO score: 56; 11/16/2022: 52 09/27/2022: 51 07/20/2022: FOTO 55 05/25/2022: FOTO score: 50,  TR score: 45 Eval: FOTO score: 40,  TR score: 45 Goal status: Achieved   2.   Pt. will independently perform oral care for 100% of the task after complete set-up. Baseline: 05/10/23: Pt. Performs 90% of the task. Pt.'s mother assists with 10% of the task for thorough cleaning the hard to reach places way in the back of the oral cavity.03/08/23: not as much assist required proximally, but to adjust positioning of toothbrush; 02/13/23: Pt. Continues to require assist proximally 01/09/2023: Continue 12/28/2022:  Pt. Continues to complete 90% of the task with complete set-up, with visual cues, and occassional assist of the right elbow. 11/16/2022: Pt. Completes 90% of the task with complete set-up. 09/26/2022: Completes 90% of the task, proximal assist at times required at the elbow.  08/15/2022: Completes 90% of the task, proximal assist at times required at the elbow. 07/20/22: completes 90% of task, limited by shoulder flexion. 05/25/2022:  Pt. Is able to initiate and perform oral care for approximately 90% of the task. Complete set-up required. Assi needed only for the very back teeth. 03/23/2022: Pt. Is able to initiate and perform oral care for approximately 75% of the task. Pt. Requires assist at proximally at the elbow for through oral care. 03/21/2022: Pt. Is able to initiate and perform oral care for approximately 75% of the task. Pt. Requires assist at proximally at the elbow for through oral  care. Eval: Pt. is able to initiate using an electric toothbrush. Pt. requires assist for set-up, and assist for thoroughness, and as he Pt. fatigues. Goal status:  Partially met, Goal discontinued  3.  Pt. Will be modified independence with self-feeding for 100% using a swivel spoon, and standard fork after complete set-up Baseline: 05/10/2023: Pt. feeds self 95-100% of the meal. with occasional assist to reposition the utensil. 03/08/23: indep with cup with a handle and lid, assist to reposition eating utensils in hand;  02/13/23:  Pt. Is now able to consistently, and efficiently use a swivel spoon for eating cereal with the left hand.01/09/2023: Continue 12/28/2022: Pt. Is now feeding himself 90% of the time using a swivel spoon with the left hand, and 95% of the time with the fork. Pt. Continues to have difficulty with cutting food. 11/16/2022: 100% with finger foods, Pt. Is initiating with a swivel spoon, and universal cuff for fork use, however requires assist for consistency, and accuracy.  09/26/2022: 90% using the spoon, assist at time with the forearm motion, and grip on the utensils. 100% for finger foods.  08/15/2022:  90% using the spoon, assit at time with the forearm motion, and grip on the utensils. 100% for finger foods-independent with set-up- unsupervised.  07/20/22: self-feeds cereal using spoon 90% of task. 05/25/2022: Pt. Is able to use a spoon to scoop cereal when feeding himself cereal 85% of the time. Pt. Is able to feed himself snack/finger foods 100% of the time. Pt. Continues to work on consistency of  stabilizing a cup/mug when drinking. Pt. Is able to grasp a water  bottle with assist initially, with assist tapering off as he drinks.03/23/2022: Pt. Is able to perform scooping cereal for 75% of the time. Pt. required assist, and support at the left elbow, and Pt. Presents with limited forearm  supination when using the spoon, and bringing it towards his mouth. Pt. Is able to use a fork to  spear items, and perform the hand to mouth pattern.  03/21/2022: Pt. Is able to perform scooping cereal for 75% of the time. Pt. required assist, and support at the left elbow, and Pt. presents with limited forearm supination when using the spoon, and bringing it towards his mouth. Pt. Is able to use a fork to spear items, and perform the hand to mouth pattern.  Eval: Pt. is able to hold standard standard utensils. Pt. Performs as much of the task as he, can and has assistance for the remainder. Goal status:  Improved, Achieved  4.  Pt. will improve grasp patterns and consistently grasp 1/4" objects for ADL, and IADL tasks.  Baseline: 08/27/23: Pt. is able to grasp flat discs, and sustain a secure hold on them while moving the UE through various planes. 08/07/23: Continue 05/31/2023: Continue 05/10/23: Pt. is consistently able to grasp 1/2" objects with visual cues. Pt. Requires assist, and increased visual cues to grasp 1/4" objects on an elevated plane.  Pt. Is unable to grasp the flat objects at the tabletop surface. Pt. Is grasping grasping small puppy vitamins.03/08/23: 1/2" objects efficiently; 02/13/2023: 1/2" objects efficiently 01/09/2023: Continue 12/28/2022: Continues to grasp 1/4" pegs with min a + visual cues, consistently grasping 1/2" objects with visual cues 11/16/2022: Continues to grasp 1/4" pegs with min a + visual cues, consistently grasping 1/2" objects with visual cues 09/26/2022: Continues to grasp 1/4" pegs with min a + visual cues, consistently grasping 1/2" objects with visual cues. 08/15/2022: grasps 1/4" pegs with min a + visual cues, consistently grasping 1/2" objects with visual cues. 07/20/22: grasps 1/4" pegs with min a + visual cues, consistently grasping 1/2" objects with visual cues. 05/25/2022: Pt. Is working on improving consistency of grasping 1/2" objects with visual cues.  03/23/2022: Pt. Is able to grasp 1" objects consistently,and continues to work on the hand patterns needed to  grasp 1/2" objects.03/21/2022: Pt. Is able to grasp 1" objects consistently,and continues to work on the hand patterns needed to grasp 1/2" objects. Eval: Pt. is able to grasp 1" objects intermittently using a lateral grasp pattern. Goal status: Ongoing  5.  Pt. will independently write his name legibly with letter sizes under 1". Baseline: 05/10/23: Deferred 03/08/23: Not addressed this period; 02/13/2023: Continue 01/09/2023: Continue. 12/28/2022: Pt. is now using an IPad Pen. 11/16/2022: Pt. Continues to be able to write name with smaller letter size. Pt. Is signing name with a computer styus. Pt. Requires visual cues, and assist 09/26/2022: Pt. Continues to be able to write name with smaller letter size. Pt. Is signing name with a computer styus. Pt. Requires visual cues, and assist. 08/15/2022: Pt. Is able to write name with smaller letter size. Pt. Is signing name with a computer styus. Pt. Requires visual cues, and assist. 07/20/22: stabilizing assist to write name with 75% legibility with 2" letters. 05/25/2022: Pt. to continue to work towards formulating grasp patterns in preparation for grasping a large width pen. Pt. Requires visual cues. 03/23/2022: Pt. Is able to write his name with modA, however has difficulty with formulating letter sizes less than 2" in size with 50% legibility for the 3 letters of his name.03/21/2022: Pt. Is able to write his name with modA, however has difficulty with formulating letter sizes less than 2" in size with 50% legibility for the 3 letters of his name. Eval: Pt is able to  hold a thin marker with his left hand, and formulate a line, and initiate a circular pattern (Pt. without glasses today) Goal status: Deferred  6. Pt. Will reach up to comb/brush his hair with minA.  Baseline: 05/10/23: Pt. is able to use a pick independently with the left hand with occasional assist to the far side of the left his head. 03/08/23: Pt uses pick in L hand to reach 75% of his head; unable to  reach R side of head; 02/13/2023:Pt. is able to use a hair pick with the left hand on the right side of his head, top, and back of the head. Pt. Continues to have difficulty reaching further to the right side of his head with the left. 01/09/2023: Continue 12/28/2022: Pt. Is progressing, and is now able to use a hair pick with the left hand on the right side of his head, top, and back of the head. Pt. Continues to have difficulty reaching further to the right side of his head with the left. 11/16/2022: Pt. Is able to use a hair pick for the left side of his head using his left hand. Pt. Presents with difficulty reaching to the right side of his head.09/26/2022: Pt. Is able to use a hair pick for the left side of his head using his left hand. Pt. Presents with difficulty reaching to the right side of his head. 5/13/202: 75% using a hair pick, assist to the right side of the head. 07/20/22: reaches 75% of head, assist for far R side of head, fatigues quickly. 05/25/2022: Pt. Is able to reach up with the left hand to the left side, top, and back of his head. 03/23/2022: Pt. is now able to more consistently initiate reaching up to his head with his left hand in preparation for haircare12/18/2023: Pt. is now able to more consistently initiate reaching up to his head with his left hand in preparation for haircare. Eval: Pt. is able to initiate reaching up for hair care with a long handled brush, however is unable to sustain UE's in elevation to perform the task.     Goal status: Achieved  7. Pt. Will independently navigate the w/c through his environment with minA with visual scanning, and hand placement on the controls.  Baseline: 11/16/2022: Deferred 2/2 vision 09/26/2022: Pt. Is now navigating his w/c ouside the home, and around his block. 08/15/2022: Pt. Requires minA to setup hand on controls and MIN cues to navigate the w/c in wide spaces, requires MIN - MOD cues to navigate the w/c through more narrow doorways, and  tighter turns - varies based on good vs bad vision days.07/20/22: Pt. Requires minA to setup hand on controls and MIN cues to navigate the w/c in wide spaces, requires MIN - MOD cues to navigate the w/c through more narrow doorways, and tighter turns - varies based on good vs bad vision days. 05/25/2022: Pt. Requires minA  and  cues to navigate the w/c in wide spaces, and requires Mod cues to navigate the w/c through more narrow doorways, and tighter turns. Pt. Requires max cues for scanning through the environment, and moderate cues for hand placement on the controls.     Goal status: Deferred 2/2 vision    8. Pt. Will improve bilateral grip strength to be able to independently grasp, and pull up blankets, and linens.   Baseline: 05/10/23: Deferred 03/08/23: R grip 0-5 lbs, L grip 8 lbs; pt reports he can consistently pull up blankets and linens independently.  02/13/2023: Continue. Pt. presents with digit PIP, and DIP flexor tightness. 01/09/2023: Continue 12/28/2023: R: 0#, L: 5# Pt. Is now able to more consistently grasp and pull blankets up over him. 11/16/2022: R: 0 L: 8# Pt. Is more consistently grasping his blankets and attempting to pull them up. 09/26/2022: R: 0  L: 8# Pt. is attempting to grasp, and pull blankets up more, using mostly the left hand. 08/15/2022: Pt. has difficulty securely holding, and pulling up blankets, and linens.    Goal status:Deferred  9. Pt. will consistently actively control the releasing of blankets, covers, and linens from his hands once they are in the desired position over him. Baseline:12/28/2022: Pt. Is consistently actively able to release linens once they are in the desired position over him. 11/16/2022: Pt. continues to improve with actively releasing blankets form his hands. 09/26/2022: Pt. is improving with actively releasing blankets form his hands. 08/15/2022: Pt. has difficulty with controlled releasing of blankets/linens from his grasp.     Goal status:  Achieved    10. Pt. Will improve bilateral UE strength by 2 MM grades to assist with ADLs, and IADLs  Baseline: 08/27/23:  Right: shoulder flexion: 3+/5, abduction: 4-/5, elbow flexion: 4+/5 , extension: 4-/5 wrist extension: 2/5; Left: shoulder flexion: 4+/5, abduction: 4+/5, elbow flexion: 5/5 , extension: 4/5  wrist extension: 3/5  08/07/23: Continue 05/31/2023: Continue 05/10/23: Right: shoulder flexion: 3+/5, abduction: 4-/5, elbow flexion: 4+/5 , extension: 4-/5 wrist extension: 2/5; Left: shoulder flexion: 4+/5, abduction: 4+/5, elbow flexion: 5/5 , extension: 4/5  wrist extension: 3/5 03/13/2023: Right: shoulder flexion: 3/5, abduction: 4/5, elbow flexion: 4+/5 , extension: 4-/5 wrist extension: 2/5; Left: shoulder flexion: 4+/5, abduction: 4+/5, elbow flexion: 5/5 , extension: 4/5  wrist extension: 3/5 03/08/23: NT this date d/t as pt arrived late; 02/13/2023: Continue 01/09/2023: Continue 12/28/2022: Right: shoulder flexion: 3/5, abduction: 4/5, elbow flexion: 4+/5 , extension: 4-/5 wrist extension: 2/5; Left: shoulder flexion: 4+/5, abduction: 4+/5, elbow flexion: 5/5 , extension: 4/5  wrist extension: 3/5 Right: shoulder flexion: 3+/5, abduction: 4/5, elbow flexion: 4+/5 , extension: 4+/5 wrist extension: 2/5; Left: shoulder flexion: 4+/5, abduction: 4+/5, elbow flexion: 5/5 , extension: 4+/5 wrist extension: 3-/5 11/16/2022: Right: shoulder flexion: 3/5, abduction: 4/5, elbow flexion: 4+/5 , extension: 4+/5 wrist extension: 2/5; Left: shoulder flexion: 4+/5, abduction: 4+/5, elbow flexion: 5/5 , extension:  wrist extension: 3-/5 Right: shoulder flexion: 3+/5, abduction: 4/5, elbow flexion: 4+/5 , extension: 4+/5 wrist extension: 2/5; Left: shoulder flexion: 4+/5, abduction: 4+/5, elbow flexion: 5/5 , extension: 4+/5 wrist extension: 3-/5 Goal status: Ongoing  11. Pt. will improve right wrist extension by 10 degrees to initiate reaching for items in preparation for ADLs. Baseline: 08/23/2023: R Wrist  Ext: -31(-8)  08/07/23: Continue 05/31/23: Continue 05/10/23: Right wrist extension: -32(22) 03/13/23: -22(35) 03/08/23: NT this date as pt arrived late; 02/13/2023: Continue 01/09/2023: Continue 12/28/2022: -22(34) 11/16/2022: -30(10)    Goal status: Ongoing      12. Pt. Will require donn/doff a Tank top T-shirt efficiently with supervision and full set-up.    Baseline:08/23/23: Pt. Is able to independently doff his shirt, requiring assist to lean forward and loosen the back. 08/07/23: Continue 05/31/2023: Pt. Requires maxA donning a pullover sweatshirt, Mod-maxA doffing it. T-shirt tank TBD 05/10/23: Pt. Presents with difficulty completing, and requires increased time to complete    Goal status: Ongoing    13. Pt. Will independently perform bilateral/bimanual hand tasks needed to fold towels/linens/laundry Baseline:08/23/23: Pt. Continues to have difficulty using bilateral/bimanual hand tasks needed  to fold towels/linens/laundry. 08/07/23: Continue 05/31/23: Continue 05/10/23:  Pt. Has difficulty folding towels/lines/laundry using bilateral hands Goal status:Ongoing  14. Pt. Will independently, and efficiently reach his right arm over the side of the chair to pet his dog.    Baseline: 08/23/2023: Pt. Is now able to reach over the side of the armrest of his recliner chair to pet his dog.  08/07/23: Continue 05/31/2023: Continue 05/10/23: Pt. is unable to efficiently reach his right UE over the side of his recliner chair to pet his dog.    Goal status: Achieved    15. Pt. Will independently, and efficiently reach out in front of him to efficiently place items onto his table while sitting in his recliner.     Baseline:  08/23/2023: Pt. has difficulty consistently reaching out in front of him to place items on the on a tabletop surface with the right hand. 08/07/23: Continue 2/26/225: Continue 05/10/23: Pt. has difficulty reaching out in front of him far enough to efficiently place items on the table while sitting in a  recliner chair.    Goal status: Ongoing   ASSESSMENT:  CLINICAL IMPRESSION:    Pt. Presents with tightness and sensitivity in the bilateral digit PIP, and DIP flexors. Pt. Was able to tolerate ROM, however presented with intermittent episodes for fluctuating tone on the left, and tenderness/sensitivity with right. Pt. Was able to tolerate using a 2# for the stretches in the RUE, requiring cues for breath control during the exercises. Pt. continues to be highly motivated, and Pt.'s caregivers are very supportive to continue working towards improving BUE functioning, ROM, strength, motor control, [facility] skills, as well as visual compensatory strategies in order to maximize independence with daily ADLs, and IADL functioning.      PERFORMANCE DEFICITS in functional skills including ADLs, IADLs, coordination, dexterity, proprioception, ROM, strength, pain, FMC, GMC, decreased knowledge of use of DME, and UE functional use, cognitive skills including safety awareness, and psychosocial skills including coping strategies, environmental adaptation, habits, and routines and behaviors.   IMPAIRMENTS are limiting patient from ADLs, IADLs, leisure, and social participation.   COMORBIDITIES may have co-morbidities  that affects occupational performance. Patient will benefit from skilled OT to address above impairments and improve overall function.  MODIFICATION OR ASSISTANCE TO COMPLETE EVALUATION: Maximum or significant modification of tasks or assist is necessary to complete an evaluation.  OT OCCUPATIONAL PROFILE AND HISTORY: Comprehensive assessment: Review of records and extensive additional review of physical, cognitive, psychosocial history related to current functional performance.  CLINICAL DECISION MAKING: High - multiple treatment options, significant modification of task necessary  REHAB POTENTIAL: Fair    EVALUATION COMPLEXITY: High    PLAN: OT FREQUENCY: 2 x's a week  OT DURATION:12  weeks  PLANNED INTERVENTIONS: self care/ADL training, therapeutic exercise, therapeutic activity, neuromuscular re-education, manual therapy, passive range of motion, patient/family education, and cognitive remediation/compensation RECOMMENDED OTHER SERVICES: PT  CONSULTED AND AGREED WITH PLAN OF CARE: Patient and family member/caregiver  PLAN FOR NEXT SESSION: Review HEP  Duey G[place]t, MS, OTR/L  09/04/23

## 2023-09-04 NOTE — Therapy (Addendum)
 OUTPATIENT PHYSICAL THERAPY TREATMENT/RECERT    Patient Name: Paul Hall MRN: [id number] DOB:05-Sep-1995, 28 y.o., male Today's Date: 09/05/2023  PCP:  Waunita Haff PROVIDER:  Susie Erichsen, PA-C   PT End of Session - 09/04/23 1603     Visit Number 160    Number of Visits 184   date corrected from most recent recert   Date for PT Re-Evaluation 11/27/23    Authorization Type [employer] COMM Pro/ [place] Medicaid    Authorization Time Period 01/04/21-03/29/21; Recert 03/24/2021-06/16/2021; Recert 09/15/2021- 12/08/2021; Recert 12/13/2021-03/07/2022    Progress Note Due on Visit 160    PT Start Time 1616    PT Stop Time 1700    PT Time Calculation (min) 44 min    Equipment Utilized During Treatment Gait belt    Activity Tolerance Patient tolerated treatment well    Behavior During Therapy WFL for tasks assessed/performed                             Past Medical History:  Diagnosis Date   Diabetes mellitus (HCC)    Hypertension    Stroke (HCC)    Past Surgical History:  Procedure Laterality Date   IVC FILTER PLACEMENT ([facility] HX)     LEG SURGERY     PEG TUBE PLACEMENT     TRACHEOSTOMY     Patient Active Problem List   Diagnosis Date Noted   Sepsis due to vancomycin  resistant Enterococcus species (HCC) 06/06/2019   SIRS (systemic inflammatory response syndrome) (HCC) 06/05/2019   Acute lower UTI 06/05/2019   VRE (vancomycin -resistant Enterococci) infection 06/05/2019   Anemia 06/05/2019   Skin ulcer of sacrum with necrosis of muscle (HCC)    Urinary retention    Type 2 diabetes mellitus without complication, with long-term current use of insulin  (HCC)    Tachycardia    Lower extremity edema    Acute metabolic encephalopathy    Obstructive sleep apnea    Morbid obesity with BMI of 60.0-69.9, adult (HCC)    Goals of care, counseling/discussion    Palliative care encounter    Sepsis (HCC) 04/27/2019   H/O insulin  dependent diabetes mellitus 04/27/2019    History of CVA with residual deficit 04/27/2019   Seizure disorder (HCC) 04/27/2019   Decubitus ulcer of sacral region, stage 4 (HCC) 04/27/2019    REFERRING DIAG: Cerebral infarction, unspecified   THERAPY DIAG:  Muscle weakness (generalized) - Plan: PT plan of care cert/re-cert  Other lack of coordination - Plan: PT plan of care cert/re-cert  Difficulty in walking, not elsewhere classified - Plan: PT plan of care cert/re-cert  Abnormality of gait and mobility - Plan: PT plan of care cert/re-cert  Unsteadiness on feet - Plan: PT plan of care cert/re-cert  Other abnormalities of gait and mobility - Plan: PT plan of care cert/re-cert  Rationale for Evaluation and Treatment Rehabilitation  PERTINENT HISTORY: Paul Hall is a 26yoM who presents with severe weakness, quadriparesis, altered sensorium, and visual impairment s/p critical illness and prolonged hospitalization. Pt hospitalized in October 2020 with ARDS 2/2 COVID19 infection. Pt sustained a complex and lengthy hospitalization which included tracheostomy, prolonged sedation, ECMO. In this period pt sustained CVA and SDH. Pt has now been liberated from tracheostomy and G-tube. Pt has since been hospitalized for wound infection and UTI. Pt lives with parents at home, has hospital bed and left chair, hoyer lift transfers, and power WC for mobility needs. Pt needs heavy physical assistance with  ADL 2/2 BUE contractures and motor dysfunction   PRECAUTIONS: Fall  SUBJECTIVE:  Patient states he has been standing at home more since settling into new home.   PAIN:  Are you having pain? No. But feeling tight.    TODAY'S TREATMENT:  - 09/04/2023    *Patient was dependently transferred from power w/c to mat and later back from mat to power w/c (using personal hoyer pad) - hoyer lift   Physical therapy treatment session today consisted of completing assessment of goals and administration of testing as demonstrated and documented in  flow sheet, treatment, and goals section of this note. Addition treatments may be found below.                    PATIENT EDUCATION: Education details: Exercise technique with VC   HOME EXERCISE PROGRAM:  Access Code: [email or web address] URL: [email or web address] Date: 03/23/2022 Prepared by: Ferrell Hu  Exercises - Supine Bridge  - 3 x weekly - 3 sets - 10 reps - 2 hold - Supine Gluteal Sets  - 3 x weekly - 3 sets - 10 reps - 5 sec hold - Supine Quad Set  - 1 x daily - 3 x weekly - 3 sets - 10 reps - 5 hold - Seated Long Arc Quad  - 1 x daily - 7 x weekly - 3 sets - 10 reps - Seated Hip Adduction Squeeze with Ball  - 1 x daily - 3 x weekly - 3 sets - 10 reps - 5 hold - Seated Hip Abduction  - 1 x daily - 3 x weekly - 3 sets - 10 reps - 2 hold    PT Short Term Goals -       PT SHORT TERM GOAL #1   Title Pt will be independent with HEP in order to improve strength and balance in order to decrease fall risk and improve function at home and work.    Baseline 01/04/2021= No formal HEP in place; 12/12 no HEP in place; 05/10/2021-Patient and his father were able to report compliance with curent HEP consisting of mostly seated/reclined LE strengthening. Both verbalize no questions at this time.    Time 6    Period Weeks    Status Achieved    Target Date 02/15/21            PT LONG TERM GOAL #1     Title Patient will increase BLE gross strength by 1/2 muscle grade to improve functional strength for improved independence with potential gait, increased standing tolerance and increased ADL ability.     Baseline 01/04/2021- Patient presents with 1/5 to 3-/5 B LE strength with MMT; 12/12: goal partially met for Left knee/hip; 05/10/2021= 2-/5 bilateal Hip flex; 3+/5 bilateral Knee ext; 06/21/2021= Patient presents with 2-/5 bilateral Hip flex; 3+/5 bilateral knee ext/flex; 2-/5 left ankle DF; 0/5 right ankle- and able to increase reps and resistance with LE's. 09/15/2021-  Patient technically presents with 2-/5 B hip flex/abd/add - but he is able to raise his hip up to approx 100 deg which has improved. 3+/5 Bilateral knee ext, 2-/5 left ankle and 0/5 right ankle.  12/08/2021= Patient able to lift left knee at 110 deg of hip flex; presents with 3+/5 knee ext, 2-/5 left ankle DF and 0/5 right ankle DF, 2-/5 bilateral Hip abd in seated position.    12/6: R: knee 3+/5 ext, 2/5 flexion, left knee 3+/5 extension, 3+/5 flexion, R hip: 2+/5 hip add, 2+/5 hip ABD L  hip: 4-/5 hip ABD, 3+/5 hip ADD, 3+/5 hip flexion; 06/06/2022= Patient now presents with 2-/5 right ankle DF/PF;     Time 12     Period Weeks     Status MET    Target Date 03/07/2022         PT LONG TERM GOAL #2    Title Patient will tolerate sitting unsupported demonstrating erect sitting posture for 15 minutes with CGA to demonstrate improved back extensor strength and improved sitting tolerance.     Baseline 01/04/2021- Patient confied to sitting in lift chair or electric power chair with back support and unable to sit upright without physical assistance; 12/12: tolerates <1 minutes upright unsupported sitting. 05/10/2021=static sit with forward trunk lean  in his power wheelchair without back support x approx 3 min. 06/21/2021=Unable to assess today due to patient with acute back pain but on previous visit able to sit x 8 min without back support. 09/15/2021- on last visit- 09/13/2021- patient was able to sit unsupported x 8 min at edge of mat. 10/13/2023 - Patient was able to sit at edge of mat with varying level of assist today from SBA to min A for a total of 20 min. 12/13/2021= Patient demonstrated unsupported sitting at edge of mat for approx 20 min    Time 12     Period Weeks     Status GOAL MET    Target Date 12/08/21          PT LONG TERM GOAL #3    Title Patient will demonstrate ability to perform static standing in // bars > 2 min with Max Assist  without loss of balance and fair posture for improved overall  strength for pre-gait and transfer activities.     Baseline 01/04/2021= Patient current uanble to stand- Dependent on hoyer or sit to stand lift for transfers. 05/10/2021=Not appropriate yet- Currently still dependent with all transfers using hoyer. 06/21/2021= Patient continuing now to focus on LE strengthening to prepare for standing-unable to try today due to acute low back pain-  planning on attempting in new cert period. 09/15/2021- Patient has attempted standing 2x in past two week- max Assist of 2 people - only once was he successful to clearing his bottom from chair - Will continue to be a focus during the new certification. 12/13/2021= Patient has been limited secondary to increased overall low back pain during this certification and will require more time to focus on this goal.  12/6: not assessed this date, will assess at date when 2-3 PTs are present for assistance     Time 12     Period Weeks     Status GOAL not appropriate at this time - may attempt in future once Patient presents with improved overall LE strength.                 PT LONG TERM GOAL #4    Title Pt will improve FOTO score by 10 points or more demonstrating improved perceived functional ability     Baseline FOTO 7 on 10/17; 03/15/21: FOTO 12; 05/10/2021 06/21/2021= 1; 09/15/2021= 9; 12/13/2021= Will issue next visit 12/6: 4; 06/06/2022= Will assess next visit; Goal not appropriate as patient is not ambulatory.     Time 12     Period Weeks     Status WITHDRAWN    Target date 11/30/2022         PT LONG TERM GOAL #5    Title Patient will perform sit to stand transfer  with appropriate AD and max assist of 2 people with 75% consistency to prepare for pregait activities.     Baseline 09/15/2021= Patient unable to stand well- unable to clear his bottom off chair with Max assist of 2 persons. 12/13/2021- Goal not appropriate to try yet but will keep and roll over to next cert as shift continues to focus on transfers/standing; 4/24= Patient able  to perform active ankle DF/PF with right LE and able to raise his knee into seated march and clear floor without physical assist today - as previously unable as well as lift right knee ext to near full ROM to improve strength for eventual standing. 09/07/2022- Goal continues to not be appropriate but patient is now standing some and will keep goal active; 11/23/2022= patient is now performing sit to stand with max A +2 and now able to clear his bottom off mat. 11/30/2022- Patient continues to perform sit to stand transfers with Max A+2 and able to clear bottom well off mat but not erect yet. 05/31/2023- Patient is able to stand consistently with max A +2 - unable to achieve full knee or trunk ext yet is able to clear bottom off mat for improved LE weightbearing. 09/04/2023- Patient performed several Sit to stand requiring max A+2  with improving posture- able to ext R knee better - near terminal knee and able to clear bottom off mat- still not full but able to contract glutes for standing as erect as possible.     Time 12     Period Weeks     Status PROGRESSING    Target date  11/27/2023       PT LONG TERM GOAL #6  Title Patient will tolerate sitting unsupported demonstrating erect sitting posture for 30 minutes with CGA to demonstrate improved back extensor strength and improved sitting tolerance.   Baseline 12/13/2021= Patient demonstrated unsupported sitting at edge of mat for approx 20 min;  12/29/2021- Patient performed approx 30 min of dynamic sitting activities today. 06/06/2022= Patient demonstrated ability to sit and perform static and dynamic UE/LE movement with only Supervision.   Time 12   Period Weeks   Status GOAL MET           7.  Patient will tolerate 2 minutes or more of standing in sit to stand lift or with max assist +2 in order to indicate improved lower extremity weightbearing tolerance for progression to standing in parallel bars. Baseline: 1 minute on most recent stand 02/21/22; 06/06/2022-  Patient did attempt today after complaining of right LE pain last week. He attempted 3 stands using sit to stand lift. - all over 1 min- last one approx 48 sec- stopped due to fatigue. More erect standing in lift today - still poor gluteal strength but able to activate glutes and extend hips upon command but unable to hold > 5 sec. 07/28/2022- Will attempt standing next visit but patient has been able to stand since last progress note for up to 3 min at a time at his best. 09/07/2022-  attempted standing with max +2 and patient able to stand 15-20 sec so will now  revise goal; 11/23/2022- Patient at his best between last 2 visits was able to stand approx 50 sec with max A+2.  11/30/2022- Patient performed 4 trials of static standing - max A +2- clearing bottom and using forearms to bear weight  03/15/2023: Patient performed 2 trials of static standing - max A +2- clearing edge of mat and using  forearms to bear weight for approximately 45 seconds, is able to come into approximately 45 degrees of knee flexion from sit to stand; 05/31/2023- Patient has not attempted standing in 1 month but able to perform x 4 trials with max A +2 (PT and his mom) and able to clear his bottom off mat and attempted to activate his glutes and apply some forearm pressure - able to stand between 25-40 sec today. 09/04/2023- Patient performed 3 trials of standing ranging from 17 -47 sec - improving right knee terminal knee ext and more activation upon command with gluteals- able to clear mat table. Continued to require Max A+2 and blocking knees with forearm/trunk support.   Goal status: PROGRESSING Target date: 11/27/2023   8. Patient will tolerate sitting unsupported demonstrating ability to perform dynamic UE/LE activities for 30 minutes  independently to demonstrate ability to sit at edge of bed to eat or perform some ADL's/exercise for optimal quality of life.             Baseline: 06/06/2022- Patient able to sit and perform some UE/LE  exercises but requires CGA at times for safety- he is able to static sit for 30 min with supervision. 07/27/2022= Patient able to now sit > 30 min with Supervision only - performing static and dynamic activities. Will keep goal active to ensure patient is consistent. 09/07/2022=  Patient able to demonstrate consistent ability to sit at edge of mat during visits             Goal Status: MET             Target date: 08/29/2022  9.  Patient will perform rolling left to right with Mod assist for improved independence with bed mobility to assist with dressing and positioning in bed. Baseline: 09/04/2023- Mother reported last visit that he was having some difficulty with assisting with bed mobility. Goal status: NEW Target date: 11/27/2023    10. Patient will perform active bridging -able to clear bottom   Off mat for 10 reps with PT/caregiver holding his feet    For improved trunk ext strength with standing and to    To assist with be mobility/positioning.  Baseline: 09/04/2023= (PT assisted- holding right LE In neutral and pressure on each foot) - Difficulty clearing bottom off mat. Goal status: NEW Target date: 11/27/2023           Plan     Clinical Impression Statement  Patient presents with great motivation for today's recert visit. He was eager to try to stand and performed better today than he has in past several months. He was able to clear his bottom better today and stand for longer periods of time. Still only minimal Forearm support but improved erect posture with some gluteal contraction on command today. Updated all goals and added a bed mobility goal and a gluteal muscle strength goal to assist in daily tasks while continuing to try to progress toward standing and pregait activities. Pt will continue to benefit from skilled physical therapy intervention to address impairments, improve QOL, and attain therapy goals.    Personal Factors and Comorbidities Comorbidity 3+;Time since onset of  injury/illness/exacerbation    Comorbidities CVA, diabetes, Seizures    Examination-Activity Limitations Bathing;Bed Mobility;Bend;Caring for Others;Carry;Dressing;Hygiene/Grooming;Lift;Locomotion Level;Reach Overhead;Self Feeding;Sit;Squat;Stairs;Stand;Transfers;Toileting    Examination-Participation Restrictions Cleaning;Community Activity;Driving;Laundry;Medication Management;Meal Prep;Occupation;Personal Finances;Shop;Yard Work;Volunteer    Stability/Clinical Decision Making Evolving/Moderate complexity    Rehab Potential Fair    PT Frequency 1-2x / week    PT Duration  12 weeks    PT Treatment/Interventions ADLs/Self Care Home Management;Cryotherapy;Electrical Stimulation;Moist Heat;Ultrasound;DME Instruction;Gait training;Stair training;Functional mobility training;Therapeutic exercise;Balance training;Patient/family education;Orthotic Fit/Training;Neuromuscular re-education;Wheelchair mobility training;Manual techniques;Passive range of motion;Dry needling;Energy conservation;Taping;Visual/perceptual remediation/compensation;Joint Manipulations    PT Next Visit Plan core strength/motor control in short-sitting, sit to stand if able; Continue with progressive LE Strengthening. Stand activities as appropriate. Attempt Stedy (sit to stand device) in future visits. Recert next visit.    PT Home Exercise Plan No changes to HEP today    Consulted and Agree with Plan of Care Patient;Family member/caregiver    Family Member Consulted             03/09/2023, 8:04 AM      4:52 PM, 09/05/23 [employer]ene [employer] PT Physical Therapist - [facility]  4:52 PM 09/05/23

## 2023-09-06 ENCOUNTER — Ambulatory Visit: Payer: Medicare Other | Admitting: Occupational Therapy

## 2023-09-06 ENCOUNTER — Ambulatory Visit: Payer: Medicare Other

## 2023-09-06 DIAGNOSIS — R262 Difficulty in walking, not elsewhere classified: Secondary | ICD-10-CM

## 2023-09-06 DIAGNOSIS — M6281 Muscle weakness (generalized): Secondary | ICD-10-CM

## 2023-09-06 DIAGNOSIS — R278 Other lack of coordination: Secondary | ICD-10-CM

## 2023-09-06 DIAGNOSIS — R2689 Other abnormalities of gait and mobility: Secondary | ICD-10-CM

## 2023-09-06 DIAGNOSIS — R2681 Unsteadiness on feet: Secondary | ICD-10-CM

## 2023-09-06 DIAGNOSIS — I6930 Unspecified sequelae of cerebral infarction: Secondary | ICD-10-CM | POA: Diagnosis not present

## 2023-09-06 DIAGNOSIS — R269 Unspecified abnormalities of gait and mobility: Secondary | ICD-10-CM

## 2023-09-06 NOTE — Therapy (Addendum)
 . Occupational Therapy Neuro Treatment Note  Patient Name: Paul Hall MRN: [id number] DOB:27-Jan-1996, 28 y.o., male Today's Date: 09/06/2023  PCP: Dr. Bertell REFERRING PROVIDER: Dr. Bertell    OT End of Session - 09/06/23 1828     Visit Number [id number]    Number of Visits 156    Date for OT Re-Evaluation 10/30/23    Authorization Type 09/26/2022    OT Start Time 1537    OT Stop Time 1615    OT Time Calculation (min) 38 min    Equipment Utilized During Treatment tilt in space power wc    Activity Tolerance Patient tolerated treatment well    Behavior During Therapy WFL for tasks assessed/performed                         Past Medical History:  Diagnosis Date   Diabetes mellitus (HCC)    Hypertension    Stroke (HCC)    Past Surgical History:  Procedure Laterality Date   IVC FILTER PLACEMENT ([facility] HX)     LEG SURGERY     PEG TUBE PLACEMENT     TRACHEOSTOMY     Patient Active Problem List   Diagnosis Date Noted   Sepsis due to vancomycin  resistant Enterococcus species (HCC) 06/06/2019   SIRS (systemic inflammatory response syndrome) (HCC) 06/05/2019   Acute lower UTI 06/05/2019   VRE (vancomycin -resistant Enterococci) infection 06/05/2019   Anemia 06/05/2019   Skin ulcer of sacrum with necrosis of muscle (HCC)    Urinary retention    Type 2 diabetes mellitus without complication, with long-term current use of insulin  (HCC)    Tachycardia    Lower extremity edema    Acute metabolic encephalopathy    Obstructive sleep apnea    Morbid obesity with BMI of 60.0-69.9, adult (HCC)    Goals of care, counseling/discussion    Palliative care encounter    Sepsis (HCC) 04/27/2019   H/O insulin  dependent diabetes mellitus 04/27/2019   History of CVA with residual deficit 04/27/2019   Seizure disorder (HCC) 04/27/2019   Decubitus ulcer of sacral region, stage 4 (HCC) 04/27/2019   ONSET DATE: 01/2019  REFERRING DIAG: CVA/COVID-19  THERAPY DIAG:  Muscle  weakness (generalized)  Rationale for Evaluation and Treatment Rehabilitation  SUBJECTIVE:   SUBJECTIVE STATEMENT:  Pt. Is requesting to have moist heat, and stretching.  Pt accompanied by: self and family member  PERTINENT HISTORY:  Pt. is a 28 y.o. male who was diagnosed with COVID-19, and CVA with resultant quadriplegia. Pt. Was then hospitalized with VRE UTI. PMHx includes: urinary retention, seizure disorder, obstructive sleep disorder, DM Type II, Morbid obesity.   PRECAUTIONS: None  WEIGHT BEARING RESTRICTIONS No  PAIN:  No reports of pain Are you having pain? 4-5/10 soreness in the right elbow   FALLS: Has patient fallen in last 6 months? No  LIVING ENVIRONMENT: Lives with: lives with their family Lives in: House/apartment Stairs: No Level Entry Has following equipment at home: Wheelchair (power) and hoyer lift, sit to stand lift  PLOF: Independent  PATIENT GOALS: To be able to engage in more daily care tasks.  OBJECTIVE:   HAND DOMINANCE: Right  ADLs:  Transfers/ambulation related to ADLs: Eating: Pt. reports being able to hold standard utensils, and is starting to engage more in self-feeding tasks, hand to mouth patterns. Pt. Reports that he does as much as he can with the task, and family assists  with the remainder of the task. Grooming:  Pt. Is able to initiate holding an electric toothbrush, and brush his teeth. Family assists LB Dressing: Total Assist UE dressing: Pt. is now able to reach up to actively assist with grasping , and pulling his gown down. Toileting:  Total Assist Bathing: MaxA UB, Total assist LB Tub Shower transfers: N/A Equipment: See above    IADLs: Shopping: Relies on family to assist Light housekeeping: Total Assist Meal Prep: Total Assist Community mobility:   Medication management:  Total Assist  Financial management: N/A Handwriting: Not legible: Pt. Is able to hold a pen with the left hand, and initiate marking the page.  Pt.'s eye glasses were not available  MOBILITY STATUS:  Power w/c  POSTURE COMMENTS:  Pt. Requires position changes in his power w/c  ACTIVITY TOLERANCE: Activity tolerance:  Fair  FUNCTIONAL OUTCOME MEASURES: FOTO: 40 TR score: 45  05/25/2022:   FOTO: 50 TR score: 45  07/20/22: FOTO 55  03/08/23: 51  UPPER EXTREMITY ROM     Active ROM Left eval Left  03/21/2022 Left 05/25/2022 L  07/20/22 Left 07/25/2022 Left  08/15/2022 Left  09/26/2022 Left  11/16/2022 Left 12/28/2022 Left 03/13/23 Left 05/10/23  Shoulder flexion 119 123 125 130  136 138 130 142 144 144  Shoulder abduction 110 115 115 115  118 121 125 142 140 140  Shoulder adduction             Shoulder extension             Shoulder internal rotation             Shoulder external rotation             Elbow flexion 135(135) 145 145 145  145    145 145  Elbow extension -27(-18) -21(-20) -20(-16)  -25(-10) -23(-10) -21(-10) -20(-18) -20(-18) -20(-17) -17(-16)  Wrist flexion             Wrist extension 10(50) 19(50) 28(50)  30(50) 35(55) 36(55) 36(60) 40(60) 45(60) 45(60)  Wrist ulnar deviation             Wrist radial deviation             Wrist pronation             Wrist supination   Limited by flexor tone       10(15)   (Blank rows = not tested)    Active ROM Right eval Right 03/21/2022 Right 05/25/2022 R  07/20/22 Right  07/25/22 Right 08/15/2022 Right 09/26/2022 Right 11/16/2022 Right 12/28/2022 Right 03/13/23 Right 05/10/23  Shoulder flexion 106 scaption 105  Scaption 62 flexion 110 scaption 100  105 105 105 112 Scaption 123 Scaption 122  Scaption  Shoulder abduction 114 90 97 100  105 117 120 124 124 122  Shoulder adduction             Shoulder extension             Shoulder internal rotation             Shoulder external rotation             Elbow flexion 120(130) 145 145 140  144 140 142 148 148 145  Elbow extension -45(-35) -22(-35) -28(-22)  -32(-21) -32(-28) -28(-26) -28(-28) -27(-26) -27(-40)  -38(-35)  Wrist flexion             Wrist extension -30(10) -25(10) -10(30)  -10(20) -10(25) -20(40) -30(10) -22(34) -22(35) -32(22)  Wrist ulnar deviation  Wrist radial deviation             Wrist pronation             Wrist supination   Limited by flexor tone            12/28/2022: Thumb radial abduction: Right: 12(46), Left: 40(54)  Digits: Bilateral 2nd through 5th digit: MP Hyperextension with PIP, an DIP flexion     UPPER EXTREMITY MMT:     Left eval Left 03/21/2022 Left  05/25/2022 L 07/20/22 Left  08/15/2022 Left 09/26/2022 Left 11/16/2022 Left 12/28/2022 Left  03/13/2023 Left  05/10/23  Shoulder flexion 3-/5 3/5 3+/5 4+/5 4+/5 4+/5 4+/5 4+/5 4+/5 4+/5  Shoulder abduction 3-/5 3-/5 4-/5 4+/5 4+/5 4+5 4+/5 4+/5 4+/5 4+/5  Shoulder adduction            Shoulder extension            Shoulder internal rotation            Shoulder external rotation            Elbow flexion 3/5 3+/5 4-/5 4+/5 5/5 5/5 5/5 5/5 5/5 5/5  Elbow extension 2-/5 2/5  4+/5 4+/5 4+/5  4/5 4/5 4/5 through available range  Wrist flexion            Wrist extension 2/5 2+/5 3-/5  3-/5 3-/5 3-/5 3/5 3/5 3/5  Wrist ulnar deviation            Wrist radial deviation            Wrist pronation            Wrist supination            (Blank rows = not tested)  Right eval Right 03/21/2022 Right  05/25/2022 R 07/20/22 Right 08/15/2022 Right 09/26/2022 Right 11/16/2022 Right 12/28/2022 Right 03/13/2023 Right 05/10/23  Shoulder flexion 3-/5 scaption 3-/5 3-/5 3+/5 3+/5 3+/5 3/5 3/5 3/5 3+/5  Shoulder abduction 3-/5 3-/5 3-/5 4/5 4/5 4/5 4/5 4/5 4/5 4/-5  Shoulder adduction            Shoulder extension            Shoulder internal rotation            Shoulder external rotation            Elbow flexion 3/5 3/5 3+/5 4/5 4+/5 4+/5 4+/5 4+/5 4+/5 4+/5  Elbow extension 2-/5 2/5 3-/5 4+/5 4+/5 4+/5 4+/5 4-/5 4-/5 4-/5  Wrist flexion            Wrist extension 2-/5 2-/5 2/5 2/5 2/5 2/5 2/5 2/5 2/5 2/5   Wrist ulnar deviation            Wrist radial deviation            Wrist pronation            Wrist supination              HAND FUNCTION:  Grip strength: Right: 0 lbs; Left: 0 lbs and Lateral pinch: Right: 5 lbs, Left: 2 lbs  03/21/2022: Lateral pinch: Right: 3.5 lbs, Left: 2 lbs  07/20/22: Grip strength: Right: 3 lbs; Left: 4 lbs and Lateral pinch: Right: 5 lbs, Left: 3 lbs  08/15/2022:  Grip strength: Right: 0 lbs; Left: 5 lbs and Lateral pinch: Right: 2 lbs, Left: 4 lbs  09/26/2022  Grip strength: Right: 0 lbs; Left: 8 lbs and Lateral pinch: Right: 2 lbs, Left:  5 lbs  11/16/2022  Grip strength: Right: 0 lbs; Left: 8 lbs  9/25/20204  Grip strength: Right: 0 lbs; Left: 8 lbs  Grip strength: Right: 0 lbs; Left: 8 lbs   03/08/23:  Grip strength: Right: 0 lbs without PROM, 5 lbs after PROM, Left 8 lbs  05/10/23:  Grip strength: TBD  Bilateral digit PIP/DIP flexion contractures with MP hyperextension with attempts for AROM. Pt. is able to tolerate AROM to the bilateral digits at the initial evaluation however, has a history of pain in the digits.  COORDINATION: Eval: Pt. is unable to grasp 9-hole test pegs. Pt. is able to initiate grasping larger pegs, and is able to hold a pen in the left hand.  07/20/22: 2 min 36 seconds to remove 9 pegs from 9 hole peg test - cues to locate pegs 2/2 low vision. Pt. is able to initiate grasping larger pegs on R hand and is able to hold a pen in the left hand.  08/15/2022:    Vision, and sensation limiting accuracy of 9 hole peg test results. Pt. Was able to grasp and remove vertical pegs intermittently with cues.    05/10/23: TBD  SENSATION: Light touch: Impaired   EDEMA:  N/A  MUSCLE TONE: BUE flexor Spasticity  COGNITION Overall cognitive status: Continue to assess in functional context  VISION:   Subjective report: Pt. was not wearing glasses at the time of the initial eval.  Baseline vision: Vision is very limited.  Wears glasses all the time Visual history: History of impaired vision following CVA. Pt. Has received treatment through the [facility] low vision rehabilitation program.   VISION ASSESSMENT: Impaired To be further assessed in functional context  PERCEPTION: Impaired   PRAXIS: Impaired: motor planning  OBSERVATIONS:  Pt reports being on Tramadol   TODAY'S TREATMENT  09/06/23   Therapeutic Ex.:  -Emphasis was placed on slow prolonged gentle passive stretching for RUE, forearm supination, bilateral wrist extension, wrist extension, bilateral digit MP, PIP, and DIP extension following moist heat, and left wrist flexion/extension, PIP/DIP flexion, and extension while heat was applied to the RUE. PROM was performed to decrease stiffness, and prepare the RUE for functional use. ROM was performed to the shoulder right elbow, forearm, wrist, and digits. PROM for bilateral digit MP, PIP, and DIP extension in preparation for placing them onto a flat surface.  Therapeutic Activities:  -Facilitated bilateral hand coordination working on transferring large circular tipped pegs from one hand to the other, and  reaching  out into multiple planes with the RUE before actively discarding them onto a tabletop surface.      PATIENT EDUCATION: Education details: stretching, PROM, massage to the digits on the bilateral hands. Person educated: Patient and Parent Education method: Explanation Education comprehension: verbalized understanding  HOME EXERCISE PROGRAM:  Continue ongoing assessment, and continue to provide as needed.   GOALS: Goals reviewed with patient? Yes  SHORT TERM GOALS: Target date: 09/18/2023     To assess splint fit, and make appropriate adjustments to promote good skin integrity through the palmar surface of the bilateral hands.  Baseline: 05/25/22: Goal currently met, however ongoing as needs to assess splint fit arise. 03/23/2022: Pt. is wearing splints a couple of hours at night  bilateral resting hand splints. 03/21/2022: Pt. is wearing splints a couple of hours at night bilateral resting hand splints. Goal status: Deferred   LONG TERM GOALS: Target date:10/30/2023      FOTO score will Improve by 2 points for Pt. perceived  improvement with the assessment specific ADL/IADL tasks.  Baseline:  03/08/23: 51; 12/28/2022: FOTO score: 56; 11/16/2022: 52 09/27/2022: 51 07/20/2022: FOTO 55 05/25/2022: FOTO score: 50,  TR score: 45 Eval: FOTO score: 40,  TR score: 45 Goal status: Achieved   2.   Pt. will independently perform oral care for 100% of the task after complete set-up. Baseline: 05/10/23: Pt. Performs 90% of the task. Pt.'s mother assists with 10% of the task for thorough cleaning the hard to reach places way in the back of the oral cavity.03/08/23: not as much assist required proximally, but to adjust positioning of toothbrush; 02/13/23: Pt. Continues to require assist proximally 01/09/2023: Continue 12/28/2022:  Pt. Continues to complete 90% of the task with complete set-up, with visual cues, and occassional assist of the right elbow. 11/16/2022: Pt. Completes 90% of the task with complete set-up. 09/26/2022: Completes 90% of the task, proximal assist at times required at the elbow.  08/15/2022: Completes 90% of the task, proximal assist at times required at the elbow. 07/20/22: completes 90% of task, limited by shoulder flexion. 05/25/2022:  Pt. Is able to initiate and perform oral care for approximately 90% of the task. Complete set-up required. Assi needed only for the very back teeth. 03/23/2022: Pt. Is able to initiate and perform oral care for approximately 75% of the task. Pt. Requires assist at proximally at the elbow for through oral care. 03/21/2022: Pt. Is able to initiate and perform oral care for approximately 75% of the task. Pt. Requires assist at proximally at the elbow for through oral care. Eval: Pt. is able to initiate using an electric toothbrush. Pt. requires assist for  set-up, and assist for thoroughness, and as he Pt. fatigues. Goal status:  Partially met, Goal discontinued  3.  Pt. Will be modified independence with self-feeding for 100% using a swivel spoon, and standard fork after complete set-up Baseline: 05/10/2023: Pt. feeds self 95-100% of the meal. with occasional assist to reposition the utensil. 03/08/23: indep with cup with a handle and lid, assist to reposition eating utensils in hand;  02/13/23:  Pt. Is now able to consistently, and efficiently use a swivel spoon for eating cereal with the left hand.01/09/2023: Continue 12/28/2022: Pt. Is now feeding himself 90% of the time using a swivel spoon with the left hand, and 95% of the time with the fork. Pt. Continues to have difficulty with cutting food. 11/16/2022: 100% with finger foods, Pt. Is initiating with a swivel spoon, and universal cuff for fork use, however requires assist for consistency, and accuracy.  09/26/2022: 90% using the spoon, assist at time with the forearm motion, and grip on the utensils. 100% for finger foods.  08/15/2022:  90% using the spoon, assit at time with the forearm motion, and grip on the utensils. 100% for finger foods-independent with set-up- unsupervised.  07/20/22: self-feeds cereal using spoon 90% of task. 05/25/2022: Pt. Is able to use a spoon to scoop cereal when feeding himself cereal 85% of the time. Pt. Is able to feed himself snack/finger foods 100% of the time. Pt. Continues to work on consistency of  stabilizing a cup/mug when drinking. Pt. Is able to grasp a water  bottle with assist initially, with assist tapering off as he drinks.03/23/2022: Pt. Is able to perform scooping cereal for 75% of the time. Pt. required assist, and support at the left elbow, and Pt. Presents with limited forearm supination when using the spoon, and bringing it towards his mouth. Pt. Is able to use a  fork to spear items, and perform the hand to mouth pattern.  03/21/2022: Pt. Is able to perform  scooping cereal for 75% of the time. Pt. required assist, and support at the left elbow, and Pt. presents with limited forearm supination when using the spoon, and bringing it towards his mouth. Pt. Is able to use a fork to spear items, and perform the hand to mouth pattern.  Eval: Pt. is able to hold standard standard utensils. Pt. Performs as much of the task as he, can and has assistance for the remainder. Goal status:  Improved, Achieved  4.  Pt. will improve grasp patterns and consistently grasp 1/4 objects for ADL, and IADL tasks.  Baseline: 08/27/23: Pt. is able to grasp flat discs, and sustain a secure hold on them while moving the UE through various planes. 08/07/23: Continue 05/31/2023: Continue 05/10/23: Pt. is consistently able to grasp 1/2 objects with visual cues. Pt. Requires assist, and increased visual cues to grasp 1/4 objects on an elevated plane.  Pt. Is unable to grasp the flat objects at the tabletop surface. Pt. Is grasping grasping small puppy vitamins.03/08/23: 1/2 objects efficiently; 02/13/2023: 1/2 objects efficiently 01/09/2023: Continue 12/28/2022: Continues to grasp 1/4 pegs with min a + visual cues, consistently grasping 1/2 objects with visual cues 11/16/2022: Continues to grasp 1/4 pegs with min a + visual cues, consistently grasping 1/2 objects with visual cues 09/26/2022: Continues to grasp 1/4 pegs with min a + visual cues, consistently grasping 1/2 objects with visual cues. 08/15/2022: grasps 1/4 pegs with min a + visual cues, consistently grasping 1/2 objects with visual cues. 07/20/22: grasps 1/4 pegs with min a + visual cues, consistently grasping 1/2 objects with visual cues. 05/25/2022: Pt. Is working on improving consistency of grasping 1/2 objects with visual cues.  03/23/2022: Pt. Is able to grasp 1 objects consistently,and continues to work on the hand patterns needed to grasp 1/2 objects.03/21/2022: Pt. Is able to grasp 1 objects consistently,and continues  to work on the hand patterns needed to grasp 1/2 objects. Eval: Pt. is able to grasp 1 objects intermittently using a lateral grasp pattern. Goal status: Ongoing  5.  Pt. will independently write his name legibly with letter sizes under 1. Baseline: 05/10/23: Deferred 03/08/23: Not addressed this period; 02/13/2023: Continue 01/09/2023: Continue. 12/28/2022: Pt. is now using an IPad Pen. 11/16/2022: Pt. Continues to be able to write name with smaller letter size. Pt. Is signing name with a computer styus. Pt. Requires visual cues, and assist 09/26/2022: Pt. Continues to be able to write name with smaller letter size. Pt. Is signing name with a computer styus. Pt. Requires visual cues, and assist. 08/15/2022: Pt. Is able to write name with smaller letter size. Pt. Is signing name with a computer styus. Pt. Requires visual cues, and assist. 07/20/22: stabilizing assist to write name with 75% legibility with 2 letters. 05/25/2022: Pt. to continue to work towards formulating grasp patterns in preparation for grasping a large width pen. Pt. Requires visual cues. 03/23/2022: Pt. Is able to write his name with modA, however has difficulty with formulating letter sizes less than 2 in size with 50% legibility for the 3 letters of his name.03/21/2022: Pt. Is able to write his name with modA, however has difficulty with formulating letter sizes less than 2 in size with 50% legibility for the 3 letters of his name. Eval: Pt is able to hold a thin marker with his left hand, and formulate a line, and initiate a circular pattern (  Pt. without glasses today) Goal status: Deferred  6. Pt. Will reach up to comb/brush his hair with minA.  Baseline: 05/10/23: Pt. is able to use a pick independently with the left hand with occasional assist to the far side of the left his head. 03/08/23: Pt uses pick in L hand to reach 75% of his head; unable to reach R side of head; 02/13/2023:Pt. is able to use a hair pick with the left hand on the  right side of his head, top, and back of the head. Pt. Continues to have difficulty reaching further to the right side of his head with the left. 01/09/2023: Continue 12/28/2022: Pt. Is progressing, and is now able to use a hair pick with the left hand on the right side of his head, top, and back of the head. Pt. Continues to have difficulty reaching further to the right side of his head with the left. 11/16/2022: Pt. Is able to use a hair pick for the left side of his head using his left hand. Pt. Presents with difficulty reaching to the right side of his head.09/26/2022: Pt. Is able to use a hair pick for the left side of his head using his left hand. Pt. Presents with difficulty reaching to the right side of his head. 5/13/202: 75% using a hair pick, assist to the right side of the head. 07/20/22: reaches 75% of head, assist for far R side of head, fatigues quickly. 05/25/2022: Pt. Is able to reach up with the left hand to the left side, top, and back of his head. 03/23/2022: Pt. is now able to more consistently initiate reaching up to his head with his left hand in preparation for haircare12/18/2023: Pt. is now able to more consistently initiate reaching up to his head with his left hand in preparation for haircare. Eval: Pt. is able to initiate reaching up for hair care with a long handled brush, however is unable to sustain UE's in elevation to perform the task.     Goal status: Achieved  7. Pt. Will independently navigate the w/c through his environment with minA with visual scanning, and hand placement on the controls.  Baseline: 11/16/2022: Deferred 2/2 vision 09/26/2022: Pt. Is now navigating his w/c ouside the home, and around his block. 08/15/2022: Pt. Requires minA to setup hand on controls and MIN cues to navigate the w/c in wide spaces, requires MIN - MOD cues to navigate the w/c through more narrow doorways, and tighter turns - varies based on good vs bad vision days.07/20/22: Pt. Requires minA to setup  hand on controls and MIN cues to navigate the w/c in wide spaces, requires MIN - MOD cues to navigate the w/c through more narrow doorways, and tighter turns - varies based on good vs bad vision days. 05/25/2022: Pt. Requires minA  and  cues to navigate the w/c in wide spaces, and requires Mod cues to navigate the w/c through more narrow doorways, and tighter turns. Pt. Requires max cues for scanning through the environment, and moderate cues for hand placement on the controls.     Goal status: Deferred 2/2 vision    8. Pt. Will improve bilateral grip strength to be able to independently grasp, and pull up blankets, and linens.   Baseline: 05/10/23: Deferred 03/08/23: R grip 0-5 lbs, L grip 8 lbs; pt reports he can consistently pull up blankets and linens independently.  02/13/2023: Continue. Pt. presents with digit PIP, and DIP flexor tightness. 01/09/2023: Continue 12/28/2023: R: 0#, L:  5# Pt. Is now able to more consistently grasp and pull blankets up over him. 11/16/2022: R: 0 L: 8# Pt. Is more consistently grasping his blankets and attempting to pull them up. 09/26/2022: R: 0  L: 8# Pt. is attempting to grasp, and pull blankets up more, using mostly the left hand. 08/15/2022: Pt. has difficulty securely holding, and pulling up blankets, and linens.    Goal status:Deferred  9. Pt. will consistently actively control the releasing of blankets, covers, and linens from his hands once they are in the desired position over him. Baseline:12/28/2022: Pt. Is consistently actively able to release linens once they are in the desired position over him. 11/16/2022: Pt. continues to improve with actively releasing blankets form his hands. 09/26/2022: Pt. is improving with actively releasing blankets form his hands. 08/15/2022: Pt. has difficulty with controlled releasing of blankets/linens from his grasp.     Goal status: Achieved    10. Pt. Will improve bilateral UE strength by 2 MM grades to assist with ADLs, and IADLs   Baseline: 08/27/23:  Right: shoulder flexion: 3+/5, abduction: 4-/5, elbow flexion: 4+/5 , extension: 4-/5 wrist extension: 2/5; Left: shoulder flexion: 4+/5, abduction: 4+/5, elbow flexion: 5/5 , extension: 4/5  wrist extension: 3/5  08/07/23: Continue 05/31/2023: Continue 05/10/23: Right: shoulder flexion: 3+/5, abduction: 4-/5, elbow flexion: 4+/5 , extension: 4-/5 wrist extension: 2/5; Left: shoulder flexion: 4+/5, abduction: 4+/5, elbow flexion: 5/5 , extension: 4/5  wrist extension: 3/5 03/13/2023: Right: shoulder flexion: 3/5, abduction: 4/5, elbow flexion: 4+/5 , extension: 4-/5 wrist extension: 2/5; Left: shoulder flexion: 4+/5, abduction: 4+/5, elbow flexion: 5/5 , extension: 4/5  wrist extension: 3/5 03/08/23: NT this date d/t as pt arrived late; 02/13/2023: Continue 01/09/2023: Continue 12/28/2022: Right: shoulder flexion: 3/5, abduction: 4/5, elbow flexion: 4+/5 , extension: 4-/5 wrist extension: 2/5; Left: shoulder flexion: 4+/5, abduction: 4+/5, elbow flexion: 5/5 , extension: 4/5  wrist extension: 3/5 Right: shoulder flexion: 3+/5, abduction: 4/5, elbow flexion: 4+/5 , extension: 4+/5 wrist extension: 2/5; Left: shoulder flexion: 4+/5, abduction: 4+/5, elbow flexion: 5/5 , extension: 4+/5 wrist extension: 3-/5 11/16/2022: Right: shoulder flexion: 3/5, abduction: 4/5, elbow flexion: 4+/5 , extension: 4+/5 wrist extension: 2/5; Left: shoulder flexion: 4+/5, abduction: 4+/5, elbow flexion: 5/5 , extension:  wrist extension: 3-/5 Right: shoulder flexion: 3+/5, abduction: 4/5, elbow flexion: 4+/5 , extension: 4+/5 wrist extension: 2/5; Left: shoulder flexion: 4+/5, abduction: 4+/5, elbow flexion: 5/5 , extension: 4+/5 wrist extension: 3-/5 Goal status: Ongoing  11. Pt. will improve right wrist extension by 10 degrees to initiate reaching for items in preparation for ADLs. Baseline: 08/23/2023: R Wrist Ext: -31(-8)  08/07/23: Continue 05/31/23: Continue 05/10/23: Right wrist extension: -32(22) 03/13/23:  -22(35) 03/08/23: NT this date as pt arrived late; 02/13/2023: Continue 01/09/2023: Continue 12/28/2022: -22(34) 11/16/2022: -30(10)    Goal status: Ongoing      12. Pt. Will require donn/doff a Tank top T-shirt efficiently with supervision and full set-up.    Baseline:08/23/23: Pt. Is able to independently doff his shirt, requiring assist to lean forward and loosen the back. 08/07/23: Continue 05/31/2023: Pt. Requires maxA donning a pullover sweatshirt, Mod-maxA doffing it. T-shirt tank TBD 05/10/23: Pt. Presents with difficulty completing, and requires increased time to complete    Goal status: Ongoing    13. Pt. Will independently perform bilateral/bimanual hand tasks needed to fold towels/linens/laundry Baseline:08/23/23: Pt. Continues to have difficulty using bilateral/bimanual hand tasks needed to fold towels/linens/laundry. 08/07/23: Continue 05/31/23: Continue 05/10/23:  Pt. Has difficulty folding towels/lines/laundry using bilateral hands  Goal status:Ongoing  14. Pt. Will independently, and efficiently reach his right arm over the side of the chair to pet his dog.    Baseline: 08/23/2023: Pt. Is now able to reach over the side of the armrest of his recliner chair to pet his dog.  08/07/23: Continue 05/31/2023: Continue 05/10/23: Pt. is unable to efficiently reach his right UE over the side of his recliner chair to pet his dog.    Goal status: Achieved    15. Pt. Will independently, and efficiently reach out in front of him to efficiently place items onto his table while sitting in his recliner.     Baseline:  08/23/2023: Pt. has difficulty consistently reaching out in front of him to place items on the on a tabletop surface with the right hand. 08/07/23: Continue 2/26/225: Continue 05/10/23: Pt. has difficulty reaching out in front of him far enough to efficiently place items on the table while sitting in a recliner chair.    Goal status: Ongoing   ASSESSMENT:  CLINICAL IMPRESSION:    Pt.  Reports  having had  a popping sensation when extending the elbow during reaching tasks today. Pt. Reports having 4-/10 soreness in the elbow following.   Pt. Reports that his elbow typically has soreness when his elbow pops at home, however it also feels looser/not as tight. Pt. Reports that his right elbow popped yesterday when doing his stretching at home.  Pt. Is responding well to moist heat modality. Pt. Was able to transfer items from one hand to the other with fewer cues, and assist.  Pt. Is responding well to moist heat modality. Pt. continues to be highly motivated, and Pt.'s caregivers are very supportive to continue working towards improving BUE functioning, ROM, strength, motor control, [facility] skills, as well as visual compensatory strategies in order to maximize independence with daily ADLs, and IADL functioning.      PERFORMANCE DEFICITS in functional skills including ADLs, IADLs, coordination, dexterity, proprioception, ROM, strength, pain, FMC, GMC, decreased knowledge of use of DME, and UE functional use, cognitive skills including safety awareness, and psychosocial skills including coping strategies, environmental adaptation, habits, and routines and behaviors.   IMPAIRMENTS are limiting patient from ADLs, IADLs, leisure, and social participation.   COMORBIDITIES may have co-morbidities  that affects occupational performance. Patient will benefit from skilled OT to address above impairments and improve overall function.  MODIFICATION OR ASSISTANCE TO COMPLETE EVALUATION: Maximum or significant modification of tasks or assist is necessary to complete an evaluation.  OT OCCUPATIONAL PROFILE AND HISTORY: Comprehensive assessment: Review of records and extensive additional review of physical, cognitive, psychosocial history related to current functional performance.  CLINICAL DECISION MAKING: High - multiple treatment options, significant modification of task necessary  REHAB POTENTIAL: Fair     EVALUATION COMPLEXITY: High    PLAN: OT FREQUENCY: 2 x's a week  OT DURATION:12 weeks  PLANNED INTERVENTIONS: self care/ADL training, therapeutic exercise, therapeutic activity, neuromuscular re-education, manual therapy, passive range of motion, patient/family education, and cognitive remediation/compensation RECOMMENDED OTHER SERVICES: PT  CONSULTED AND AGREED WITH PLAN OF CARE: Patient and family member/caregiver  PLAN FOR NEXT SESSION: Review HEP  Richardson Otter, MS, OTR/L  09/06/23

## 2023-09-07 NOTE — Therapy (Signed)
 OUTPATIENT PHYSICAL THERAPY TREATMENT    Patient Name: Paul Hall MRN: [id number] DOB:11/23/1995, 28 y.o., male Today's Date: 09/07/2023  PCP:  Waunita Haff PROVIDER:  Susie Erichsen, PA-C   PT End of Session - 09/06/23 1658     Visit Number 161    Number of Visits 184   date corrected from most recent recert   Date for PT Re-Evaluation 11/27/23    Authorization Type [employer] [place] [place] [employer]    Authorization Time Period 01/04/21-03/29/21; Recert 03/24/2021-06/16/2021; Recert 09/15/2021- 12/08/2021; Recert 12/13/2021-03/07/2022    Progress Note Due on Visit 160    PT Start Time 1617    PT Stop Time 1656    PT Time Calculation (min) 39 min    Equipment Utilized During Treatment Gait belt    Activity Tolerance Patient tolerated treatment well    Behavior During Therapy WFL for tasks assessed/performed                             Past Medical History:  Diagnosis Date   Diabetes mellitus (HCC)    Hypertension    Stroke (HCC)    Past Surgical History:  Procedure Laterality Date   IVC FILTER PLACEMENT ([facility] HX)     LEG SURGERY     PEG TUBE PLACEMENT     TRACHEOSTOMY     Patient Active Problem List   Diagnosis Date Noted   Sepsis due to vancomycin  resistant Enterococcus species (HCC) 06/06/2019   SIRS (systemic inflammatory response syndrome) (HCC) 06/05/2019   Acute lower UTI 06/05/2019   VRE (vancomycin -resistant Enterococci) infection 06/05/2019   Anemia 06/05/2019   Skin ulcer of sacrum with necrosis of muscle (HCC)    Urinary retention    Type 2 diabetes mellitus without complication, with long-term current use of insulin  (HCC)    Tachycardia    Lower extremity edema    Acute metabolic encephalopathy    Obstructive sleep apnea    Morbid obesity with BMI of 60.0-69.9, adult (HCC)    Goals of care, counseling/discussion    Palliative care encounter    Sepsis (HCC) 04/27/2019   H/O insulin  dependent diabetes mellitus 04/27/2019   History of  CVA with residual deficit 04/27/2019   Seizure disorder (HCC) 04/27/2019   Decubitus ulcer of sacral region, stage 4 (HCC) 04/27/2019    REFERRING DIAG: Cerebral infarction, unspecified   THERAPY DIAG:  Muscle weakness (generalized)  Other lack of coordination  Difficulty in walking, not elsewhere classified  Abnormality of gait and mobility  Unsteadiness on feet  Other abnormalities of gait and mobility  History of CVA with residual deficit  Rationale for Evaluation and Treatment Rehabilitation  PERTINENT HISTORY: Paul Hall is a 26yoM who presents with severe weakness, quadriparesis, altered sensorium, and visual impairment s/p critical illness and prolonged hospitalization. Pt hospitalized in October 2020 with ARDS 2/2 COVID19 infection. Pt sustained a complex and lengthy hospitalization which included tracheostomy, prolonged sedation, ECMO. In this period pt sustained CVA and SDH. Pt has now been liberated from tracheostomy and G-tube. Pt has since been hospitalized for wound infection and UTI. Pt lives with parents at home, has hospital bed and left chair, hoyer lift transfers, and power WC for mobility needs. Pt needs heavy physical assistance with ADL 2/2 BUE contractures and motor dysfunction   PRECAUTIONS: Fall  SUBJECTIVE:  Patient states he has been standing at home more since settling into new home.   PAIN:  Are you having  pain? No. But feeling tight.    TODAY'S TREATMENT:  - 09/04/2023    *Patient was dependently transferred from power w/c to mat and later back from mat to power w/c (using personal hoyer pad) - hoyer lift     TE:   PROM to B ankles, Knees, hips -patient very tight but did respond favorably- did report sensation like he was sliding down but PT made sure he was safe and did not slide on mat.   Modified rolling- assist with LE (hip adducting over opp LE) to simulate LE rolling- x 10 reps each LE  Patient attempted to move his legs  independently and able to minimally abd/add each LE yet unable to cross Legs over today.   Heel slides (AAROM) 2 x 10   Active ankle DF/PF 2 x 10   Manual leg press- x 10 reps each LE  - assisted back to chair as he reported increased anxiety about feeling like he was sliding- Hoyer back to power chair with use of hoyer lift and assist from patient father.                    PATIENT EDUCATION: Education details: Exercise technique with VC   HOME EXERCISE PROGRAM:  Access Code: [email or web address] URL: [email or web address] Date: 03/23/2022 Prepared by: Ferrell Hu  Exercises - Supine Bridge  - 3 x weekly - 3 sets - 10 reps - 2 hold - Supine Gluteal Sets  - 3 x weekly - 3 sets - 10 reps - 5 sec hold - Supine Quad Set  - 1 x daily - 3 x weekly - 3 sets - 10 reps - 5 hold - Seated Long Arc Quad  - 1 x daily - 7 x weekly - 3 sets - 10 reps - Seated Hip Adduction Squeeze with Ball  - 1 x daily - 3 x weekly - 3 sets - 10 reps - 5 hold - Seated Hip Abduction  - 1 x daily - 3 x weekly - 3 sets - 10 reps - 2 hold    PT Short Term Goals -       PT SHORT TERM GOAL #1   Title Pt will be independent with HEP in order to improve strength and balance in order to decrease fall risk and improve function at home and work.    Baseline 01/04/2021= No formal HEP in place; 12/12 no HEP in place; 05/10/2021-Patient and his father were able to report compliance with curent HEP consisting of mostly seated/reclined LE strengthening. Both verbalize no questions at this time.    Time 6    Period Weeks    Status Achieved    Target Date 02/15/21            PT LONG TERM GOAL #1     Title Patient will increase BLE gross strength by 1/2 muscle grade to improve functional strength for improved independence with potential gait, increased standing tolerance and increased ADL ability.     Baseline 01/04/2021- Patient presents with 1/5 to 3-/5 B LE strength with MMT; 12/12: goal partially  met for Left knee/hip; 05/10/2021= 2-/5 bilateal Hip flex; 3+/5 bilateral Knee ext; 06/21/2021= Patient presents with 2-/5 bilateral Hip flex; 3+/5 bilateral knee ext/flex; 2-/5 left ankle DF; 0/5 right ankle- and able to increase reps and resistance with LE's. 09/15/2021- Patient technically presents with 2-/5 B hip flex/abd/add - but he is able to raise his hip up to approx 100 deg which has improved. 3+/5  Bilateral knee ext, 2-/5 left ankle and 0/5 right ankle.  12/08/2021= Patient able to lift left knee at 110 deg of hip flex; presents with 3+/5 knee ext, 2-/5 left ankle DF and 0/5 right ankle DF, 2-/5 bilateral Hip abd in seated position.    12/6: R: knee 3+/5 ext, 2/5 flexion, left knee 3+/5 extension, 3+/5 flexion, R hip: 2+/5 hip add, 2+/5 hip ABD L hip: 4-/5 hip ABD, 3+/5 hip ADD, 3+/5 hip flexion; 06/06/2022= Patient now presents with 2-/5 right ankle DF/PF;     Time 12     Period Weeks     Status MET    Target Date 03/07/2022         PT LONG TERM GOAL #2    Title Patient will tolerate sitting unsupported demonstrating erect sitting posture for 15 minutes with CGA to demonstrate improved back extensor strength and improved sitting tolerance.     Baseline 01/04/2021- Patient confied to sitting in lift chair or electric power chair with back support and unable to sit upright without physical assistance; 12/12: tolerates <1 minutes upright unsupported sitting. 05/10/2021=static sit with forward trunk lean  in his power wheelchair without back support x approx 3 min. 06/21/2021=Unable to assess today due to patient with acute back pain but on previous visit able to sit x 8 min without back support. 09/15/2021- on last visit- 09/13/2021- patient was able to sit unsupported x 8 min at edge of mat. 10/13/2023 - Patient was able to sit at edge of mat with varying level of assist today from SBA to min A for a total of 20 min. 12/13/2021= Patient demonstrated unsupported sitting at edge of mat for approx 20 min    Time  12     Period Weeks     Status GOAL MET    Target Date 12/08/21          PT LONG TERM GOAL #3    Title Patient will demonstrate ability to perform static standing in // bars > 2 min with Max Assist  without loss of balance and fair posture for improved overall strength for pre-gait and transfer activities.     Baseline 01/04/2021= Patient current uanble to stand- Dependent on hoyer or sit to stand lift for transfers. 05/10/2021=Not appropriate yet- Currently still dependent with all transfers using hoyer. 06/21/2021= Patient continuing now to focus on LE strengthening to prepare for standing-unable to try today due to acute low back pain-  planning on attempting in new cert period. 09/15/2021- Patient has attempted standing 2x in past two week- max Assist of 2 people - only once was he successful to clearing his bottom from chair - Will continue to be a focus during the new certification. 12/13/2021= Patient has been limited secondary to increased overall low back pain during this certification and will require more time to focus on this goal.  12/6: not assessed this date, will assess at date when 2-3 PTs are present for assistance     Time 12     Period Weeks     Status GOAL not appropriate at this time - may attempt in future once Patient presents with improved overall LE strength.                 PT LONG TERM GOAL #4    Title Pt will improve FOTO score by 10 points or more demonstrating improved perceived functional ability     Baseline FOTO 7 on 10/17; 03/15/21: FOTO 12; 05/10/2021 06/21/2021= 1;  09/15/2021= 9; 12/13/2021= Will issue next visit 12/6: 4; 06/06/2022= Will assess next visit; Goal not appropriate as patient is not ambulatory.     Time 12     Period Weeks     Status WITHDRAWN    Target date 11/30/2022         PT LONG TERM GOAL #5    Title Patient will perform sit to stand transfer with appropriate AD and max assist of 2 people with 75% consistency to prepare for pregait activities.      Baseline 09/15/2021= Patient unable to stand well- unable to clear his bottom off chair with Max assist of 2 persons. 12/13/2021- Goal not appropriate to try yet but will keep and roll over to next cert as shift continues to focus on transfers/standing; 4/24= Patient able to perform active ankle DF/PF with right LE and able to raise his knee into seated march and clear floor without physical assist today - as previously unable as well as lift right knee ext to near full ROM to improve strength for eventual standing. 09/07/2022- Goal continues to not be appropriate but patient is now standing some and will keep goal active; 11/23/2022= patient is now performing sit to stand with max A +2 and now able to clear his bottom off mat. 11/30/2022- Patient continues to perform sit to stand transfers with Max A+2 and able to clear bottom well off mat but not erect yet. 05/31/2023- Patient is able to stand consistently with max A +2 - unable to achieve full knee or trunk ext yet is able to clear bottom off mat for improved LE weightbearing. 09/04/2023- Patient performed several Sit to stand requiring max A+2  with improving posture- able to ext R knee better - near terminal knee and able to clear bottom off mat- still not full but able to contract glutes for standing as erect as possible.     Time 12     Period Weeks     Status PROGRESSING    Target date  11/27/2023       PT LONG TERM GOAL #6  Title Patient will tolerate sitting unsupported demonstrating erect sitting posture for 30 minutes with CGA to demonstrate improved back extensor strength and improved sitting tolerance.   Baseline 12/13/2021= Patient demonstrated unsupported sitting at edge of mat for approx 20 min;  12/29/2021- Patient performed approx 30 min of dynamic sitting activities today. 06/06/2022= Patient demonstrated ability to sit and perform static and dynamic UE/LE movement with only Supervision.   Time 12   Period Weeks   Status GOAL MET           7.   Patient will tolerate 2 minutes or more of standing in sit to stand lift or with max assist +2 in order to indicate improved lower extremity weightbearing tolerance for progression to standing in parallel bars. Baseline: 1 minute on most recent stand 02/21/22; 06/06/2022- Patient did attempt today after complaining of right LE pain last week. He attempted 3 stands using sit to stand lift. - all over 1 min- last one approx 48 sec- stopped due to fatigue. More erect standing in lift today - still poor gluteal strength but able to activate glutes and extend hips upon command but unable to hold > 5 sec. 07/28/2022- Will attempt standing next visit but patient has been able to stand since last progress note for up to 3 min at a time at his best. 09/07/2022-  attempted standing with max +2 and patient  able to stand 15-20 sec so will now  revise goal; 11/23/2022- Patient at his best between last 2 visits was able to stand approx 50 sec with max A+2.  11/30/2022- Patient performed 4 trials of static standing - max A +2- clearing bottom and using forearms to bear weight  03/15/2023: Patient performed 2 trials of static standing - max A +2- clearing edge of mat and using forearms to bear weight for approximately 45 seconds, is able to come into approximately 45 degrees of knee flexion from sit to stand; 05/31/2023- Patient has not attempted standing in 1 month but able to perform x 4 trials with max A +2 (PT and his mom) and able to clear his bottom off mat and attempted to activate his glutes and apply some forearm pressure - able to stand between 25-40 sec today. 09/04/2023- Patient performed 3 trials of standing ranging from 17 -47 sec - improving right knee terminal knee ext and more activation upon command with gluteals- able to clear mat table. Continued to require Max A+2 and blocking knees with forearm/trunk support.   Goal status: PROGRESSING Target date: 11/27/2023   8. Patient will tolerate sitting unsupported  demonstrating ability to perform dynamic UE/LE activities for 30 minutes  independently to demonstrate ability to sit at edge of bed to eat or perform some ADL's/exercise for optimal quality of life.             Baseline: 06/06/2022- Patient able to sit and perform some UE/LE exercises but requires CGA at times for safety- he is able to static sit for 30 min with supervision. 07/27/2022= Patient able to now sit > 30 min with Supervision only - performing static and dynamic activities. Will keep goal active to ensure patient is consistent. 09/07/2022=  Patient able to demonstrate consistent ability to sit at edge of mat during visits             Goal Status: MET             Target date: 08/29/2022  9.  Patient will perform rolling left to right with Mod assist for improved independence with bed mobility to assist with dressing and positioning in bed. Baseline: 09/04/2023- Mother reported last visit that he was having some difficulty with assisting with bed mobility. Goal status: NEW Target date: 11/27/2023    10. Patient will perform active bridging -able to clear bottom   Off mat for 10 reps with PT/caregiver holding his feet    For improved trunk ext strength with standing and to    To assist with be mobility/positioning.  Baseline: 09/04/2023= (PT assisted- holding right LE In neutral and pressure on each foot) - Difficulty clearing bottom off mat. Goal status: NEW Target date: 11/27/2023           Plan     Clinical Impression Statement  Attempted some LE strengthening/ROM/bed mobility in supine to address newer goals. Patient was uncomfortable with lying and felt like he was sliding and even though PT encouraged him that he was not sliding his anxiety grew during today's visit- difficulty progressing but he was able to perform some LE strengthening and good review for some activities to perform in the home.  Pt will continue to benefit from skilled physical therapy intervention to address impairments,  improve QOL, and attain therapy goals.    Personal Factors and Comorbidities Comorbidity 3+;Time since onset of injury/illness/exacerbation    Comorbidities CVA, diabetes, Seizures    Examination-Activity Limitations Bathing;Bed Mobility;Bend;Caring for  Others;Carry;Dressing;Hygiene/Grooming;Lift;Locomotion Level;Reach Overhead;Self Feeding;Sit;Squat;Stairs;Stand;Transfers;Toileting    Examination-Participation Restrictions Cleaning;Community Activity;Driving;Laundry;Medication Management;Meal Prep;Occupation;Personal Finances;Shop;Yard Work;Volunteer    Stability/Clinical Decision Making Evolving/Moderate complexity    Rehab Potential Fair    PT Frequency 1-2x / week    PT Duration 12 weeks    PT Treatment/Interventions ADLs/Self Care Home Management;Cryotherapy;Electrical Stimulation;Moist Heat;Ultrasound;DME Instruction;Gait training;Stair training;Functional mobility training;Therapeutic exercise;Balance training;Patient/family education;Orthotic Fit/Training;Neuromuscular re-education;Wheelchair mobility training;Manual techniques;Passive range of motion;Dry needling;Energy conservation;Taping;Visual/perceptual remediation/compensation;Joint Manipulations    PT Next Visit Plan core strength/motor control in short-sitting, sit to stand if able; Continue with progressive LE Strengthening. Stand activities as appropriate. Attempt Stedy (sit to stand device) in future visits. Recert next visit.    PT Home Exercise Plan No changes to HEP today    Consulted and Agree with Plan of Care Patient;Family member/caregiver    Family Member Consulted             03/09/2023, 8:04 AM      2:06 PM, 09/07/23 M[employer]ene [employer] PT Physical Therapist - [facility]  2:06 PM 09/07/23

## 2023-09-11 ENCOUNTER — Ambulatory Visit: Payer: Medicare Other | Admitting: Occupational Therapy

## 2023-09-11 ENCOUNTER — Ambulatory Visit: Payer: Medicare Other

## 2023-09-11 NOTE — Therapy (Deleted)
 OUTPATIENT PHYSICAL THERAPY TREATMENT    Patient Name: Paul Hall MRN: [id number] DOB:10-Apr-1995, 28 y.o., male Today's Date: 09/11/2023  PCP:  Waunita Haff PROVIDER:  Susie Erichsen, PA-C                     Past Medical History:  Diagnosis Date   Diabetes mellitus (HCC)    Hypertension    Stroke [facility])    Past Surgical History:  Procedure Laterality Date   IVC FILTER PLACEMENT ([facility] HX)     LEG SURGERY     PEG TUBE PLACEMENT     TRACHEOSTOMY     Patient Active Problem List   Diagnosis Date Noted   Sepsis due to vancomycin  resistant Enterococcus species (HCC) 06/06/2019   SIRS (systemic inflammatory response syndrome) (HCC) 06/05/2019   Acute lower UTI 06/05/2019   VRE (vancomycin -resistant Enterococci) infection 06/05/2019   Anemia 06/05/2019   Skin ulcer of sacrum with necrosis of muscle (HCC)    Urinary retention    Type 2 diabetes mellitus without complication, with long-term current use of insulin  (HCC)    Tachycardia    Lower extremity edema    Acute metabolic encephalopathy    Obstructive sleep apnea    Morbid obesity with BMI of 60.0-69.9, adult (HCC)    Goals of care, counseling/discussion    Palliative care encounter    Sepsis (HCC) 04/27/2019   H/O insulin  dependent diabetes mellitus 04/27/2019   History of CVA with residual deficit 04/27/2019   Seizure disorder (HCC) 04/27/2019   Decubitus ulcer of sacral region, stage 4 (HCC) 04/27/2019    REFERRING DIAG: Cerebral infarction, unspecified   THERAPY DIAG:  No diagnosis found.  Rationale for Evaluation and Treatment Rehabilitation  PERTINENT HISTORY: Paul Hall is a 26yoM who presents with severe weakness, quadriparesis, altered sensorium, and visual impairment s/p critical illness and prolonged hospitalization. Pt hospitalized in October 2020 with ARDS 2/2 COVID19 infection. Pt sustained a complex and lengthy hospitalization which included tracheostomy, prolonged  sedation, ECMO. In this period pt sustained CVA and SDH. Pt has now been liberated from tracheostomy and G-tube. Pt has since been hospitalized for wound infection and UTI. Pt lives with parents at home, has hospital bed and left chair, hoyer lift transfers, and power WC for mobility needs. Pt needs heavy physical assistance with ADL 2/2 BUE contractures and motor dysfunction   PRECAUTIONS: Fall  SUBJECTIVE:  ***Patient states he has been standing at home more since settling into new home.   PAIN:  Are you having pain? No. But feeling tight.    TODAY'S TREATMENT:  - 09/11/2023    *Patient was dependently transferred from power w/c to mat and later back from mat to power w/c (using personal hoyer pad) - hoyer lift     TE:   PROM to B ankles, Knees, hips -patient very tight but did respond favorably- did report sensation like he was sliding down but PT made sure he was safe and did not slide on mat.   Modified rolling- assist with LE (hip adducting over opp LE) to simulate LE rolling- x 10 reps each LE  Patient attempted to move his legs independently and able to minimally abd/add each LE yet unable to cross Legs over today.   Heel slides (AAROM) 2 x 10   Active ankle DF/PF 2 x 10   Manual leg press- x 10 reps each LE  - assisted back to chair as he reported increased anxiety about feeling like he was  sliding- Hoyer back to power chair with use of hoyer lift and assist from patient father.                    PATIENT EDUCATION: Education details: Exercise technique with VC   HOME EXERCISE PROGRAM:  Access Code: [email or web address] URL: [email or web address] Date: 03/23/2022 Prepared by: Ferrell Hu  Exercises - Supine Bridge  - 3 x weekly - 3 sets - 10 reps - 2 hold - Supine Gluteal Sets  - 3 x weekly - 3 sets - 10 reps - 5 sec hold - Supine Quad Set  - 1 x daily - 3 x weekly - 3 sets - 10 reps - 5 hold - Seated Long Arc Quad  - 1 x daily - 7 x weekly -  3 sets - 10 reps - Seated Hip Adduction Squeeze with Ball  - 1 x daily - 3 x weekly - 3 sets - 10 reps - 5 hold - Seated Hip Abduction  - 1 x daily - 3 x weekly - 3 sets - 10 reps - 2 hold    PT Short Term Goals -       PT SHORT TERM GOAL #1   Title Pt will be independent with HEP in order to improve strength and balance in order to decrease fall risk and improve function at home and work.    Baseline 01/04/2021= No formal HEP in place; 12/12 no HEP in place; 05/10/2021-Patient and his father were able to report compliance with curent HEP consisting of mostly seated/reclined LE strengthening. Both verbalize no questions at this time.    Time 6    Period Weeks    Status Achieved    Target Date 02/15/21            PT LONG TERM GOAL #1     Title Patient will increase BLE gross strength by 1/2 muscle grade to improve functional strength for improved independence with potential gait, increased standing tolerance and increased ADL ability.     Baseline 01/04/2021- Patient presents with 1/5 to 3-/5 B LE strength with MMT; 12/12: goal partially met for Left knee/hip; 05/10/2021= 2-/5 bilateal Hip flex; 3+/5 bilateral Knee ext; 06/21/2021= Patient presents with 2-/5 bilateral Hip flex; 3+/5 bilateral knee ext/flex; 2-/5 left ankle DF; 0/5 right ankle- and able to increase reps and resistance with LE's. 09/15/2021- Patient technically presents with 2-/5 B hip flex/abd/add - but he is able to raise his hip up to approx 100 deg which has improved. 3+/5 Bilateral knee ext, 2-/5 left ankle and 0/5 right ankle.  12/08/2021= Patient able to lift left knee at 110 deg of hip flex; presents with 3+/5 knee ext, 2-/5 left ankle DF and 0/5 right ankle DF, 2-/5 bilateral Hip abd in seated position.    12/6: R: knee 3+/5 ext, 2/5 flexion, left knee 3+/5 extension, 3+/5 flexion, R hip: 2+/5 hip add, 2+/5 hip ABD L hip: 4-/5 hip ABD, 3+/5 hip ADD, 3+/5 hip flexion; 06/06/2022= Patient now presents with 2-/5 right ankle DF/PF;      Time 12     Period Weeks     Status MET    Target Date 03/07/2022         PT LONG TERM GOAL #2    Title Patient will tolerate sitting unsupported demonstrating erect sitting posture for 15 minutes with CGA to demonstrate improved back extensor strength and improved sitting tolerance.     Baseline 01/04/2021- Patient confied to sitting in lift  chair or electric power chair with back support and unable to sit upright without physical assistance; 12/12: tolerates <1 minutes upright unsupported sitting. 05/10/2021=static sit with forward trunk lean  in his power wheelchair without back support x approx 3 min. 06/21/2021=Unable to assess today due to patient with acute back pain but on previous visit able to sit x 8 min without back support. 09/15/2021- on last visit- 09/13/2021- patient was able to sit unsupported x 8 min at edge of mat. 10/13/2023 - Patient was able to sit at edge of mat with varying level of assist today from SBA to min A for a total of 20 min. 12/13/2021= Patient demonstrated unsupported sitting at edge of mat for approx 20 min    Time 12     Period Weeks     Status GOAL MET    Target Date 12/08/21          PT LONG TERM GOAL #3    Title Patient will demonstrate ability to perform static standing in // bars > 2 min with Max Assist  without loss of balance and fair posture for improved overall strength for pre-gait and transfer activities.     Baseline 01/04/2021= Patient current uanble to stand- Dependent on hoyer or sit to stand lift for transfers. 05/10/2021=Not appropriate yet- Currently still dependent with all transfers using hoyer. 06/21/2021= Patient continuing now to focus on LE strengthening to prepare for standing-unable to try today due to acute low back pain-  planning on attempting in new cert period. 09/15/2021- Patient has attempted standing 2x in past two week- max Assist of 2 people - only once was he successful to clearing his bottom from chair - Will continue to be a focus  during the new certification. 12/13/2021= Patient has been limited secondary to increased overall low back pain during this certification and will require more time to focus on this goal.  12/6: not assessed this date, will assess at date when 2-3 PTs are present for assistance     Time 12     Period Weeks     Status GOAL not appropriate at this time - may attempt in future once Patient presents with improved overall LE strength.                 PT LONG TERM GOAL #4    Title Pt will improve FOTO score by 10 points or more demonstrating improved perceived functional ability     Baseline FOTO 7 on 10/17; 03/15/21: FOTO 12; 05/10/2021 06/21/2021= 1; 09/15/2021= 9; 12/13/2021= Will issue next visit 12/6: 4; 06/06/2022= Will assess next visit; Goal not appropriate as patient is not ambulatory.     Time 12     Period Weeks     Status WITHDRAWN    Target date 11/30/2022         PT LONG TERM GOAL #5    Title Patient will perform sit to stand transfer with appropriate AD and max assist of 2 people with 75% consistency to prepare for pregait activities.     Baseline 09/15/2021= Patient unable to stand well- unable to clear his bottom off chair with Max assist of 2 persons. 12/13/2021- Goal not appropriate to try yet but will keep and roll over to next cert as shift continues to focus on transfers/standing; 4/24= Patient able to perform active ankle DF/PF with right LE and able to raise his knee into seated march and clear floor without physical assist today - as previously unable as  well as lift right knee ext to near full ROM to improve strength for eventual standing. 09/07/2022- Goal continues to not be appropriate but patient is now standing some and will keep goal active; 11/23/2022= patient is now performing sit to stand with max A +2 and now able to clear his bottom off mat. 11/30/2022- Patient continues to perform sit to stand transfers with Max A+2 and able to clear bottom well off mat but not erect yet. 05/31/2023-  Patient is able to stand consistently with max A +2 - unable to achieve full knee or trunk ext yet is able to clear bottom off mat for improved LE weightbearing. 09/04/2023- Patient performed several Sit to stand requiring max A+2  with improving posture- able to ext R knee better - near terminal knee and able to clear bottom off mat- still not full but able to contract glutes for standing as erect as possible.     Time 12     Period Weeks     Status PROGRESSING    Target date  11/27/2023       PT LONG TERM GOAL #6  Title Patient will tolerate sitting unsupported demonstrating erect sitting posture for 30 minutes with CGA to demonstrate improved back extensor strength and improved sitting tolerance.   Baseline 12/13/2021= Patient demonstrated unsupported sitting at edge of mat for approx 20 min;  12/29/2021- Patient performed approx 30 min of dynamic sitting activities today. 06/06/2022= Patient demonstrated ability to sit and perform static and dynamic UE/LE movement with only Supervision.   Time 12   Period Weeks   Status GOAL MET           7.  Patient will tolerate 2 minutes or more of standing in sit to stand lift or with max assist +2 in order to indicate improved lower extremity weightbearing tolerance for progression to standing in parallel bars. Baseline: 1 minute on most recent stand 02/21/22; 06/06/2022- Patient did attempt today after complaining of right LE pain last week. He attempted 3 stands using sit to stand lift. - all over 1 min- last one approx 48 sec- stopped due to fatigue. More erect standing in lift today - still poor gluteal strength but able to activate glutes and extend hips upon command but unable to hold > 5 sec. 07/28/2022- Will attempt standing next visit but patient has been able to stand since last progress note for up to 3 min at a time at his best. 09/07/2022-  attempted standing with max +2 and patient able to stand 15-20 sec so will now  revise goal; 11/23/2022- Patient  at his best between last 2 visits was able to stand approx 50 sec with max A+2.  11/30/2022- Patient performed 4 trials of static standing - max A +2- clearing bottom and using forearms to bear weight  03/15/2023: Patient performed 2 trials of static standing - max A +2- clearing edge of mat and using forearms to bear weight for approximately 45 seconds, is able to come into approximately 45 degrees of knee flexion from sit to stand; 05/31/2023- Patient has not attempted standing in 1 month but able to perform x 4 trials with max A +2 (PT and his mom) and able to clear his bottom off mat and attempted to activate his glutes and apply some forearm pressure - able to stand between 25-40 sec today. 09/04/2023- Patient performed 3 trials of standing ranging from 17 -47 sec - improving right knee terminal knee ext and more activation  upon command with gluteals- able to clear mat table. Continued to require Max A+2 and blocking knees with forearm/trunk support.   Goal status: PROGRESSING Target date: 11/27/2023   8. Patient will tolerate sitting unsupported demonstrating ability to perform dynamic UE/LE activities for 30 minutes  independently to demonstrate ability to sit at edge of bed to eat or perform some ADL's/exercise for optimal quality of life.             Baseline: 06/06/2022- Patient able to sit and perform some UE/LE exercises but requires CGA at times for safety- he is able to static sit for 30 min with supervision. 07/27/2022= Patient able to now sit > 30 min with Supervision only - performing static and dynamic activities. Will keep goal active to ensure patient is consistent. 09/07/2022=  Patient able to demonstrate consistent ability to sit at edge of mat during visits             Goal Status: MET             Target date: 08/29/2022  9.  Patient will perform rolling left to right with Mod assist for improved independence with bed mobility to assist with dressing and positioning in bed. Baseline: 09/04/2023-  Mother reported last visit that he was having some difficulty with assisting with bed mobility. Goal status: NEW Target date: 11/27/2023    10. Patient will perform active bridging -able to clear bottom   Off mat for 10 reps with PT/caregiver holding his feet    For improved trunk ext strength with standing and to    To assist with be mobility/positioning.  Baseline: 09/04/2023= (PT assisted- holding right LE In neutral and pressure on each foot) - Difficulty clearing bottom off mat. Goal status: NEW Target date: 11/27/2023           Plan     Clinical Impression Statement  Attempted some LE strengthening/ROM/bed mobility in supine to address newer goals. Patient was uncomfortable with lying and felt like he was sliding and even though PT encouraged him that he was not sliding his anxiety grew during today's visit- difficulty progressing but he was able to perform some LE strengthening and good review for some activities to perform in the home.  Pt will continue to benefit from skilled physical therapy intervention to address impairments, improve QOL, and attain therapy goals.    Personal Factors and Comorbidities Comorbidity 3+;Time since onset of injury/illness/exacerbation    Comorbidities CVA, diabetes, Seizures    Examination-Activity Limitations Bathing;Bed Mobility;Bend;Caring for Others;Carry;Dressing;Hygiene/Grooming;Lift;Locomotion Level;Reach Overhead;Self Feeding;Sit;Squat;Stairs;Stand;Transfers;Toileting    Examination-Participation Restrictions Cleaning;Community Activity;Driving;Laundry;Medication Management;Meal Prep;Occupation;Personal Finances;Shop;Yard Work;Volunteer    Stability/Clinical Decision Making Evolving/Moderate complexity    Rehab Potential Fair    PT Frequency 1-2x / week    PT Duration 12 weeks    PT Treatment/Interventions ADLs/Self Care Home Management;Cryotherapy;Electrical Stimulation;Moist Heat;Ultrasound;DME Instruction;Gait training;Stair training;Functional  mobility training;Therapeutic exercise;Balance training;Patient/family education;Orthotic Fit/Training;Neuromuscular re-education;Wheelchair mobility training;Manual techniques;Passive range of motion;Dry needling;Energy conservation;Taping;Visual/perceptual remediation/compensation;Joint Manipulations    PT Next Visit Plan core strength/motor control in short-sitting, sit to stand if able; Continue with progressive LE Strengthening. Stand activities as appropriate. Attempt Stedy (sit to stand device) in future visits. Recert next visit.    PT Home Exercise Plan No changes to HEP today    Consulted and Agree with Plan of Care Patient;Family member/caregiver    Family Member Consulted             03/09/2023, 8:04 AM      9:45 AM, 09/11/23  [employer] PT Physical Therapist - [place]  [facility]  9:45 AM 09/11/23

## 2023-09-13 ENCOUNTER — Ambulatory Visit: Payer: Medicare Other | Admitting: Occupational Therapy

## 2023-09-13 ENCOUNTER — Ambulatory Visit: Payer: Medicare Other

## 2023-09-15 DIAGNOSIS — L8994 Pressure ulcer of unspecified site, stage 4: Secondary | ICD-10-CM | POA: Diagnosis not present

## 2023-09-15 DIAGNOSIS — I6389 Other cerebral infarction: Secondary | ICD-10-CM | POA: Diagnosis not present

## 2023-09-15 DIAGNOSIS — R32 Unspecified urinary incontinence: Secondary | ICD-10-CM | POA: Diagnosis not present

## 2023-09-18 ENCOUNTER — Ambulatory Visit: Payer: Medicare Other

## 2023-09-18 ENCOUNTER — Ambulatory Visit: Payer: Medicare Other | Admitting: Occupational Therapy

## 2023-09-18 DIAGNOSIS — R269 Unspecified abnormalities of gait and mobility: Secondary | ICD-10-CM

## 2023-09-18 DIAGNOSIS — R278 Other lack of coordination: Secondary | ICD-10-CM | POA: Diagnosis not present

## 2023-09-18 DIAGNOSIS — R2681 Unsteadiness on feet: Secondary | ICD-10-CM

## 2023-09-18 DIAGNOSIS — I6930 Unspecified sequelae of cerebral infarction: Secondary | ICD-10-CM | POA: Diagnosis not present

## 2023-09-18 DIAGNOSIS — R262 Difficulty in walking, not elsewhere classified: Secondary | ICD-10-CM | POA: Diagnosis not present

## 2023-09-18 DIAGNOSIS — M6281 Muscle weakness (generalized): Secondary | ICD-10-CM | POA: Diagnosis not present

## 2023-09-18 DIAGNOSIS — R2689 Other abnormalities of gait and mobility: Secondary | ICD-10-CM | POA: Diagnosis not present

## 2023-09-18 NOTE — Therapy (Signed)
 OUTPATIENT PHYSICAL THERAPY TREATMENT    Patient Name: Paul Hall MRN: [id number] DOB:09-05-1995, 28 y.o., male Today's Date: 09/18/2023  PCP:  Waunita Haff PROVIDER:  Susie Erichsen, PA-C   PT End of Session - 09/18/23 1713     Visit Number 161    Number of Visits 184   date corrected from most recent recert   Date for PT Re-Evaluation 11/27/23    Authorization Type [employer]/ [place] Medicaid    Authorization Time Period 01/04/21-03/29/21; Recert 03/24/2021-06/16/2021; Recert 09/15/2021- 12/08/2021; Recert 12/13/2021-03/07/2022    Progress Note Due on Visit 160    PT Start Time 1600    PT Stop Time 1605    PT Time Calculation (min) 5 min    Equipment Utilized During Treatment Gait belt    Activity Tolerance Patient tolerated treatment well    Behavior During Therapy WFL for tasks assessed/performed                           Past Medical History:  Diagnosis Date   Diabetes mellitus (HCC)    Hypertension    Stroke (HCC)    Past Surgical History:  Procedure Laterality Date   IVC FILTER PLACEMENT ([facility] HX)     LEG SURGERY     PEG TUBE PLACEMENT     TRACHEOSTOMY     Patient Active Problem List   Diagnosis Date Noted   Sepsis due to vancomycin  resistant Enterococcus species (HCC) 06/06/2019   SIRS (systemic inflammatory response syndrome) (HCC) 06/05/2019   Acute lower UTI 06/05/2019   VRE (vancomycin -resistant Enterococci) infection 06/05/2019   Anemia 06/05/2019   Skin ulcer of sacrum with necrosis of muscle (HCC)    Urinary retention    Type 2 diabetes mellitus without complication, with long-term current use of insulin  (HCC)    Tachycardia    Lower extremity edema    Acute metabolic encephalopathy    Obstructive sleep apnea    Morbid obesity with BMI of 60.0-69.9, adult (HCC)    Goals of care, counseling/discussion    Palliative care encounter    Sepsis (HCC) 04/27/2019   H/O insulin  dependent diabetes mellitus 04/27/2019   History of CVA  with residual deficit 04/27/2019   Seizure disorder (HCC) 04/27/2019   Decubitus ulcer of sacral region, stage 4 (HCC) 04/27/2019    REFERRING DIAG: Cerebral infarction, unspecified   THERAPY DIAG:  Muscle weakness (generalized)  Other lack of coordination  Difficulty in walking, not elsewhere classified  Abnormality of gait and mobility  Unsteadiness on feet  Rationale for Evaluation and Treatment Rehabilitation  PERTINENT HISTORY: Paul Hall is a 26yoM who presents with severe weakness, quadriparesis, altered sensorium, and visual impairment s/p critical illness and prolonged hospitalization. Pt hospitalized in October 2020 with ARDS 2/2 COVID19 infection. Pt sustained a complex and lengthy hospitalization which included tracheostomy, prolonged sedation, ECMO. In this period pt sustained CVA and SDH. Pt has now been liberated from tracheostomy and G-tube. Pt has since been hospitalized for wound infection and UTI. Pt lives with parents at home, has hospital bed and left chair, hoyer lift transfers, and power WC for mobility needs. Pt needs heavy physical assistance with ADL 2/2 BUE contractures and motor dysfunction   PRECAUTIONS: Fall  SUBJECTIVE:  Patient reports being late due to transportation.   PAIN:  Are you having pain? No. But feeling tight.    TODAY'S TREATMENT:  - 09/11/2023    No treatment today- Patient arrived at 16:00 (  scheduled time was 15:30).Deferred treatment to next visit. Patient does has OT at 16:15                  PATIENT EDUCATION: Education details: Exercise technique with VC   HOME EXERCISE PROGRAM:  Access Code: [email or web address] URL: [email or web address] Date: 03/23/2022 Prepared by: Ferrell Hu  Exercises - Supine Bridge  - 3 x weekly - 3 sets - 10 reps - 2 hold - Supine Gluteal Sets  - 3 x weekly - 3 sets - 10 reps - 5 sec hold - Supine Quad Set  - 1 x daily - 3 x weekly - 3 sets - 10 reps - 5 hold - Seated  Long Arc Quad  - 1 x daily - 7 x weekly - 3 sets - 10 reps - Seated Hip Adduction Squeeze with Ball  - 1 x daily - 3 x weekly - 3 sets - 10 reps - 5 hold - Seated Hip Abduction  - 1 x daily - 3 x weekly - 3 sets - 10 reps - 2 hold    PT Short Term Goals -       PT SHORT TERM GOAL #1   Title Pt will be independent with HEP in order to improve strength and balance in order to decrease fall risk and improve function at home and work.    Baseline 01/04/2021= No formal HEP in place; 12/12 no HEP in place; 05/10/2021-Patient and his father were able to report compliance with curent HEP consisting of mostly seated/reclined LE strengthening. Both verbalize no questions at this time.    Time 6    Period Weeks    Status Achieved    Target Date 02/15/21            PT LONG TERM GOAL #1     Title Patient will increase BLE gross strength by 1/2 muscle grade to improve functional strength for improved independence with potential gait, increased standing tolerance and increased ADL ability.     Baseline 01/04/2021- Patient presents with 1/5 to 3-/5 B LE strength with MMT; 12/12: goal partially met for Left knee/hip; 05/10/2021= 2-/5 bilateal Hip flex; 3+/5 bilateral Knee ext; 06/21/2021= Patient presents with 2-/5 bilateral Hip flex; 3+/5 bilateral knee ext/flex; 2-/5 left ankle DF; 0/5 right ankle- and able to increase reps and resistance with LE's. 09/15/2021- Patient technically presents with 2-/5 B hip flex/abd/add - but he is able to raise his hip up to approx 100 deg which has improved. 3+/5 Bilateral knee ext, 2-/5 left ankle and 0/5 right ankle.  12/08/2021= Patient able to lift left knee at 110 deg of hip flex; presents with 3+/5 knee ext, 2-/5 left ankle DF and 0/5 right ankle DF, 2-/5 bilateral Hip abd in seated position.    12/6: R: knee 3+/5 ext, 2/5 flexion, left knee 3+/5 extension, 3+/5 flexion, R hip: 2+/5 hip add, 2+/5 hip ABD L hip: 4-/5 hip ABD, 3+/5 hip ADD, 3+/5 hip flexion; 06/06/2022= Patient  now presents with 2-/5 right ankle DF/PF;     Time 12     Period Weeks     Status MET    Target Date 03/07/2022         PT LONG TERM GOAL #2    Title Patient will tolerate sitting unsupported demonstrating erect sitting posture for 15 minutes with CGA to demonstrate improved back extensor strength and improved sitting tolerance.     Baseline 01/04/2021- Patient confied to sitting in lift chair or electric power  chair with back support and unable to sit upright without physical assistance; 12/12: tolerates <1 minutes upright unsupported sitting. 05/10/2021=static sit with forward trunk lean  in his power wheelchair without back support x approx 3 min. 06/21/2021=Unable to assess today due to patient with acute back pain but on previous visit able to sit x 8 min without back support. 09/15/2021- on last visit- 09/13/2021- patient was able to sit unsupported x 8 min at edge of mat. 10/13/2023 - Patient was able to sit at edge of mat with varying level of assist today from SBA to min A for a total of 20 min. 12/13/2021= Patient demonstrated unsupported sitting at edge of mat for approx 20 min    Time 12     Period Weeks     Status GOAL MET    Target Date 12/08/21          PT LONG TERM GOAL #3    Title Patient will demonstrate ability to perform static standing in // bars > 2 min with Max Assist  without loss of balance and fair posture for improved overall strength for pre-gait and transfer activities.     Baseline 01/04/2021= Patient current uanble to stand- Dependent on hoyer or sit to stand lift for transfers. 05/10/2021=Not appropriate yet- Currently still dependent with all transfers using hoyer. 06/21/2021= Patient continuing now to focus on LE strengthening to prepare for standing-unable to try today due to acute low back pain-  planning on attempting in new cert period. 09/15/2021- Patient has attempted standing 2x in past two week- max Assist of 2 people - only once was he successful to clearing his bottom  from chair - Will continue to be a focus during the new certification. 12/13/2021= Patient has been limited secondary to increased overall low back pain during this certification and will require more time to focus on this goal.  12/6: not assessed this date, will assess at date when 2-3 PTs are present for assistance     Time 12     Period Weeks     Status GOAL not appropriate at this time - may attempt in future once Patient presents with improved overall LE strength.                 PT LONG TERM GOAL #4    Title Pt will improve FOTO score by 10 points or more demonstrating improved perceived functional ability     Baseline FOTO 7 on 10/17; 03/15/21: FOTO 12; 05/10/2021 06/21/2021= 1; 09/15/2021= 9; 12/13/2021= Will issue next visit 12/6: 4; 06/06/2022= Will assess next visit; Goal not appropriate as patient is not ambulatory.     Time 12     Period Weeks     Status WITHDRAWN    Target date 11/30/2022         PT LONG TERM GOAL #5    Title Patient will perform sit to stand transfer with appropriate AD and max assist of 2 people with 75% consistency to prepare for pregait activities.     Baseline 09/15/2021= Patient unable to stand well- unable to clear his bottom off chair with Max assist of 2 persons. 12/13/2021- Goal not appropriate to try yet but will keep and roll over to next cert as shift continues to focus on transfers/standing; 4/24= Patient able to perform active ankle DF/PF with right LE and able to raise his knee into seated march and clear floor without physical assist today - as previously unable as well as lift right  knee ext to near full ROM to improve strength for eventual standing. 09/07/2022- Goal continues to not be appropriate but patient is now standing some and will keep goal active; 11/23/2022= patient is now performing sit to stand with max A +2 and now able to clear his bottom off mat. 11/30/2022- Patient continues to perform sit to stand transfers with Max A+2 and able to clear bottom  well off mat but not erect yet. 05/31/2023- Patient is able to stand consistently with max A +2 - unable to achieve full knee or trunk ext yet is able to clear bottom off mat for improved LE weightbearing. 09/04/2023- Patient performed several Sit to stand requiring max A+2  with improving posture- able to ext R knee better - near terminal knee and able to clear bottom off mat- still not full but able to contract glutes for standing as erect as possible.     Time 12     Period Weeks     Status PROGRESSING    Target date  11/27/2023       PT LONG TERM GOAL #6  Title Patient will tolerate sitting unsupported demonstrating erect sitting posture for 30 minutes with CGA to demonstrate improved back extensor strength and improved sitting tolerance.   Baseline 12/13/2021= Patient demonstrated unsupported sitting at edge of mat for approx 20 min;  12/29/2021- Patient performed approx 30 min of dynamic sitting activities today. 06/06/2022= Patient demonstrated ability to sit and perform static and dynamic UE/LE movement with only Supervision.   Time 12   Period Weeks   Status GOAL MET           7.  Patient will tolerate 2 minutes or more of standing in sit to stand lift or with max assist +2 in order to indicate improved lower extremity weightbearing tolerance for progression to standing in parallel bars. Baseline: 1 minute on most recent stand 02/21/22; 06/06/2022- Patient did attempt today after complaining of right LE pain last week. He attempted 3 stands using sit to stand lift. - all over 1 min- last one approx 48 sec- stopped due to fatigue. More erect standing in lift today - still poor gluteal strength but able to activate glutes and extend hips upon command but unable to hold > 5 sec. 07/28/2022- Will attempt standing next visit but patient has been able to stand since last progress note for up to 3 min at a time at his best. 09/07/2022-  attempted standing with max +2 and patient able to stand 15-20 sec so  will now  revise goal; 11/23/2022- Patient at his best between last 2 visits was able to stand approx 50 sec with max A+2.  11/30/2022- Patient performed 4 trials of static standing - max A +2- clearing bottom and using forearms to bear weight  03/15/2023: Patient performed 2 trials of static standing - max A +2- clearing edge of mat and using forearms to bear weight for approximately 45 seconds, is able to come into approximately 45 degrees of knee flexion from sit to stand; 05/31/2023- Patient has not attempted standing in 1 month but able to perform x 4 trials with max A +2 (PT and his mom) and able to clear his bottom off mat and attempted to activate his glutes and apply some forearm pressure - able to stand between 25-40 sec today. 09/04/2023- Patient performed 3 trials of standing ranging from 17 -47 sec - improving right knee terminal knee ext and more activation upon command with gluteals-  able to clear mat table. Continued to require Max A+2 and blocking knees with forearm/trunk support.   Goal status: PROGRESSING Target date: 11/27/2023   8. Patient will tolerate sitting unsupported demonstrating ability to perform dynamic UE/LE activities for 30 minutes  independently to demonstrate ability to sit at edge of bed to eat or perform some ADL's/exercise for optimal quality of life.             Baseline: 06/06/2022- Patient able to sit and perform some UE/LE exercises but requires CGA at times for safety- he is able to static sit for 30 min with supervision. 07/27/2022= Patient able to now sit > 30 min with Supervision only - performing static and dynamic activities. Will keep goal active to ensure patient is consistent. 09/07/2022=  Patient able to demonstrate consistent ability to sit at edge of mat during visits             Goal Status: MET             Target date: 08/29/2022  9.  Patient will perform rolling left to right with Mod assist for improved independence with bed mobility to assist with dressing  and positioning in bed. Baseline: 09/04/2023- Mother reported last visit that he was having some difficulty with assisting with bed mobility. Goal status: NEW Target date: 11/27/2023    10. Patient will perform active bridging -able to clear bottom   Off mat for 10 reps with PT/caregiver holding his feet    For improved trunk ext strength with standing and to    To assist with be mobility/positioning.  Baseline: 09/04/2023= (PT assisted- holding right LE In neutral and pressure on each foot) - Difficulty clearing bottom off mat. Goal status: NEW Target date: 11/27/2023           Plan     Clinical Impression Statement  Non-billable visit- patient arrived 30 min late due to transportation. Deferred treatment and will resume normal session next visit. Patient in agreement with plan.    Personal Factors and Comorbidities Comorbidity 3+;Time since onset of injury/illness/exacerbation    Comorbidities CVA, diabetes, Seizures    Examination-Activity Limitations Bathing;Bed Mobility;Bend;Caring for Others;Carry;Dressing;Hygiene/Grooming;Lift;Locomotion Level;Reach Overhead;Self Feeding;Sit;Squat;Stairs;Stand;Transfers;Toileting    Examination-Participation Restrictions Cleaning;Community Activity;Driving;Laundry;Medication Management;Meal Prep;Occupation;Personal Finances;Shop;Yard Work;Volunteer    Stability/Clinical Decision Making Evolving/Moderate complexity    Rehab Potential Fair    PT Frequency 1-2x / week    PT Duration 12 weeks    PT Treatment/Interventions ADLs/Self Care Home Management;Cryotherapy;Electrical Stimulation;Moist Heat;Ultrasound;DME Instruction;Gait training;Stair training;Functional mobility training;Therapeutic exercise;Balance training;Patient/family education;Orthotic Fit/Training;Neuromuscular re-education;Wheelchair mobility training;Manual techniques;Passive range of motion;Dry needling;Energy conservation;Taping;Visual/perceptual remediation/compensation;Joint Manipulations     PT Next Visit Plan core strength/motor control in short-sitting, sit to stand if able; Continue with progressive LE Strengthening. Stand activities as appropriate. Attempt Stedy (sit to stand device) in future visits. Recert next visit.    PT Home Exercise Plan No changes to HEP today    Consulted and Agree with Plan of Care Patient;Family member/caregiver    Family Member Consulted             03/09/2023, 8:04 AM      5:14 PM, 09/18/23 Murlene [employer] PT Physical Therapist - [facility]  5:14 PM 09/18/23

## 2023-09-18 NOTE — Therapy (Addendum)
 . Occupational Therapy Neuro Treatment Note  Patient Name: Paul Hall MRN: [id number] DOB:1995-11-22, 28 y.o., male Today's Date: 09/18/2023  PCP: Dr. Bertell REFERRING PROVIDER: Dr. Bertell    OT End of Session - 09/18/23 1740     Visit Number [id number]    Number of Visits 156    Date for OT Re-Evaluation 10/30/23    OT Start Time 1621    OT Stop Time 1705    OT Time Calculation (min) 44 min    Activity Tolerance Patient tolerated treatment well    Behavior During Therapy WFL for tasks assessed/performed                      Past Medical History:  Diagnosis Date   Diabetes mellitus (HCC)    Hypertension    Stroke [facility])    Past Surgical History:  Procedure Laterality Date   IVC FILTER PLACEMENT ([facility] HX)     LEG SURGERY     PEG TUBE PLACEMENT     TRACHEOSTOMY     Patient Active Problem List   Diagnosis Date Noted   Sepsis due to vancomycin  resistant Enterococcus species (HCC) 06/06/2019   SIRS (systemic inflammatory response syndrome) (HCC) 06/05/2019   Acute lower UTI 06/05/2019   VRE (vancomycin -resistant Enterococci) infection 06/05/2019   Anemia 06/05/2019   Skin ulcer of sacrum with necrosis of muscle (HCC)    Urinary retention    Type 2 diabetes mellitus without complication, with long-term current use of insulin  (HCC)    Tachycardia    Lower extremity edema    Acute metabolic encephalopathy    Obstructive sleep apnea    Morbid obesity with BMI of 60.0-69.9, adult (HCC)    Goals of care, counseling/discussion    Palliative care encounter    Sepsis (HCC) 04/27/2019   H/O insulin  dependent diabetes mellitus 04/27/2019   History of CVA with residual deficit 04/27/2019   Seizure disorder (HCC) 04/27/2019   Decubitus ulcer of sacral region, stage 4 (HCC) 04/27/2019   ONSET DATE: 01/2019  REFERRING DIAG: CVA/COVID-19  THERAPY DIAG:  Muscle weakness (generalized)  Other lack of coordination  Rationale for Evaluation and Treatment  Rehabilitation  SUBJECTIVE:   SUBJECTIVE STATEMENT:  Pt. Is requesting to have moist heat, and stretching.  Pt accompanied by: self and family member  PERTINENT HISTORY:  Pt. is a 28 y.o. male who was diagnosed with COVID-19, and CVA with resultant quadriplegia. Pt. Was then hospitalized with VRE UTI. PMHx includes: urinary retention, seizure disorder, obstructive sleep disorder, DM Type II, Morbid obesity.   PRECAUTIONS: None  WEIGHT BEARING RESTRICTIONS No  PAIN:  No reports of pain Are you having pain? 4-5/10 soreness in the right elbow   FALLS: Has patient fallen in last 6 months? No  LIVING ENVIRONMENT: Lives with: lives with their family Lives in: House/apartment Stairs: No Level Entry Has following equipment at home: Wheelchair (power) and hoyer lift, sit to stand lift  PLOF: Independent  PATIENT GOALS: To be able to engage in more daily care tasks.  OBJECTIVE:   HAND DOMINANCE: Right  ADLs:  Transfers/ambulation related to ADLs: Eating: Pt. reports being able to hold standard utensils, and is starting to engage more in self-feeding tasks, hand to mouth patterns. Pt. Reports that he does as much as he can with the task, and family assists  with the remainder of the task. Grooming: Pt. Is able to initiate holding an electric toothbrush, and brush his teeth. Family assists LB  Dressing: Total Assist UE dressing: Pt. is now able to reach up to actively assist with grasping , and pulling his gown down. Toileting:  Total Assist Bathing: MaxA UB, Total assist LB Tub Shower transfers: N/A Equipment: See above    IADLs: Shopping: Relies on family to assist Light housekeeping: Total Assist Meal Prep: Total Assist Community mobility:   Medication management:  Total Assist  Financial management: N/A Handwriting: Not legible: Pt. Is able to hold a pen with the left hand, and initiate marking the page. Pt.'s eye glasses were not available  MOBILITY STATUS:  Power  w/c  POSTURE COMMENTS:  Pt. Requires position changes in his power w/c  ACTIVITY TOLERANCE: Activity tolerance:  Fair  FUNCTIONAL OUTCOME MEASURES: FOTO: 40 TR score: 45  05/25/2022:   FOTO: 50 TR score: 45  07/20/22: FOTO 55  03/08/23: 51  UPPER EXTREMITY ROM     Active ROM Left eval Left  03/21/2022 Left 05/25/2022 L  07/20/22 Left 07/25/2022 Left  08/15/2022 Left  09/26/2022 Left  11/16/2022 Left 12/28/2022 Left 03/13/23 Left 05/10/23  Shoulder flexion 119 123 125 130  136 138 130 142 144 144  Shoulder abduction 110 115 115 115  118 121 125 142 140 140  Shoulder adduction             Shoulder extension             Shoulder internal rotation             Shoulder external rotation             Elbow flexion 135(135) 145 145 145  145    145 145  Elbow extension -27(-18) -21(-20) -20(-16)  -25(-10) -23(-10) -21(-10) -20(-18) -20(-18) -20(-17) -17(-16)  Wrist flexion             Wrist extension 10(50) 19(50) 28(50)  30(50) 35(55) 36(55) 36(60) 40(60) 45(60) 45(60)  Wrist ulnar deviation             Wrist radial deviation             Wrist pronation             Wrist supination   Limited by flexor tone       10(15)   (Blank rows = not tested)    Active ROM Right eval Right 03/21/2022 Right 05/25/2022 R  07/20/22 Right  07/25/22 Right 08/15/2022 Right 09/26/2022 Right 11/16/2022 Right 12/28/2022 Right 03/13/23 Right 05/10/23  Shoulder flexion 106 scaption 105  Scaption 62 flexion 110 scaption 100  105 105 105 112 Scaption 123 Scaption 122  Scaption  Shoulder abduction 114 90 97 100  105 117 120 124 124 122  Shoulder adduction             Shoulder extension             Shoulder internal rotation             Shoulder external rotation             Elbow flexion 120(130) 145 145 140  144 140 142 148 148 145  Elbow extension -45(-35) -22(-35) -28(-22)  -32(-21) -32(-28) -28(-26) -28(-28) -27(-26) -27(-40) -38(-35)  Wrist flexion             Wrist extension -30(10)  -25(10) -10(30)  -10(20) -10(25) -20(40) -30(10) -22(34) -22(35) -32(22)  Wrist ulnar deviation             Wrist radial deviation  Wrist pronation             Wrist supination   Limited by flexor tone            12/28/2022: Thumb radial abduction: Right: 12(46), Left: 40(54)  Digits: Bilateral 2nd through 5th digit: MP Hyperextension with PIP, an DIP flexion     UPPER EXTREMITY MMT:     Left eval Left 03/21/2022 Left  05/25/2022 L 07/20/22 Left  08/15/2022 Left 09/26/2022 Left 11/16/2022 Left 12/28/2022 Left  03/13/2023 Left  05/10/23  Shoulder flexion 3-/5 3/5 3+/5 4+/5 4+/5 4+/5 4+/5 4+/5 4+/5 4+/5  Shoulder abduction 3-/5 3-/5 4-/5 4+/5 4+/5 4+5 4+/5 4+/5 4+/5 4+/5  Shoulder adduction            Shoulder extension            Shoulder internal rotation            Shoulder external rotation            Elbow flexion 3/5 3+/5 4-/5 4+/5 5/5 5/5 5/5 5/5 5/5 5/5  Elbow extension 2-/5 2/5  4+/5 4+/5 4+/5  4/5 4/5 4/5 through available range  Wrist flexion            Wrist extension 2/5 2+/5 3-/5  3-/5 3-/5 3-/5 3/5 3/5 3/5  Wrist ulnar deviation            Wrist radial deviation            Wrist pronation            Wrist supination            (Blank rows = not tested)  Right eval Right 03/21/2022 Right  05/25/2022 R 07/20/22 Right 08/15/2022 Right 09/26/2022 Right 11/16/2022 Right 12/28/2022 Right 03/13/2023 Right 05/10/23  Shoulder flexion 3-/5 scaption 3-/5 3-/5 3+/5 3+/5 3+/5 3/5 3/5 3/5 3+/5  Shoulder abduction 3-/5 3-/5 3-/5 4/5 4/5 4/5 4/5 4/5 4/5 4/-5  Shoulder adduction            Shoulder extension            Shoulder internal rotation            Shoulder external rotation            Elbow flexion 3/5 3/5 3+/5 4/5 4+/5 4+/5 4+/5 4+/5 4+/5 4+/5  Elbow extension 2-/5 2/5 3-/5 4+/5 4+/5 4+/5 4+/5 4-/5 4-/5 4-/5  Wrist flexion            Wrist extension 2-/5 2-/5 2/5 2/5 2/5 2/5 2/5 2/5 2/5 2/5  Wrist ulnar deviation            Wrist radial deviation             Wrist pronation            Wrist supination              HAND FUNCTION:  Grip strength: Right: 0 lbs; Left: 0 lbs and Lateral pinch: Right: 5 lbs, Left: 2 lbs  03/21/2022: Lateral pinch: Right: 3.5 lbs, Left: 2 lbs  07/20/22: Grip strength: Right: 3 lbs; Left: 4 lbs and Lateral pinch: Right: 5 lbs, Left: 3 lbs  08/15/2022:  Grip strength: Right: 0 lbs; Left: 5 lbs and Lateral pinch: Right: 2 lbs, Left: 4 lbs  09/26/2022  Grip strength: Right: 0 lbs; Left: 8 lbs and Lateral pinch: Right: 2 lbs, Left: 5 lbs  11/16/2022  Grip strength: Right: 0 lbs; Left: 8 lbs  9/25/20204  Grip strength: Right: 0 lbs; Left: 8 lbs  Grip strength: Right: 0 lbs; Left: 8 lbs   03/08/23:  Grip strength: Right: 0 lbs without PROM, 5 lbs after PROM, Left 8 lbs  05/10/23:  Grip strength: TBD  Bilateral digit PIP/DIP flexion contractures with MP hyperextension with attempts for AROM. Pt. is able to tolerate AROM to the bilateral digits at the initial evaluation however, has a history of pain in the digits.  COORDINATION: Eval: Pt. is unable to grasp 9-hole test pegs. Pt. is able to initiate grasping larger pegs, and is able to hold a pen in the left hand.  07/20/22: 2 min 36 seconds to remove 9 pegs from 9 hole peg test - cues to locate pegs 2/2 low vision. Pt. is able to initiate grasping larger pegs on R hand and is able to hold a pen in the left hand.  08/15/2022:    Vision, and sensation limiting accuracy of 9 hole peg test results. Pt. Was able to grasp and remove vertical pegs intermittently with cues.    05/10/23: TBD  SENSATION: Light touch: Impaired   EDEMA:  N/A  MUSCLE TONE: BUE flexor Spasticity  COGNITION Overall cognitive status: Continue to assess in functional context  VISION:   Subjective report: Pt. was not wearing glasses at the time of the initial eval.  Baseline vision: Vision is very limited. Wears glasses all the time Visual history: History of impaired vision  following CVA. Pt. Has received treatment through the [facility] low vision rehabilitation program.   VISION ASSESSMENT: Impaired To be further assessed in functional context  PERCEPTION: Impaired   PRAXIS: Impaired: motor planning  OBSERVATIONS:  Pt reports being on Tramadol   TODAY'S TREATMENT  09/18/23   Therapeutic Ex.:  -Emphasis was placed on slow prolonged gentle passive stretching for RUE, forearm supination, bilateral wrist extension, wrist extension, bilateral digit MP, PIP, and DIP extension following moist heat, and left wrist flexion/extension, PIP/DIP flexion, and extension while heat was applied to the RUE. PROM was performed to decrease stiffness, and prepare the RUE for functional use. ROM was performed to the shoulder right elbow, forearm, wrist, and digits. PROM for bilateral digit MP, PIP, and DIP extension in preparation for placing them onto a flat surface.  Therapeutic Activities:  -Facilitated  diagonal patterns of movement through multiple planes with the right UE in preparation for engagement of the RUE in functional hand to face tasks.      PATIENT EDUCATION: Education details: stretching, PROM, massage to the digits on the bilateral hands. Person educated: Patient and Parent Education method: Explanation Education comprehension: verbalized understanding  HOME EXERCISE PROGRAM:  Continue ongoing assessment, and continue to provide as needed.   GOALS: Goals reviewed with patient? Yes  SHORT TERM GOALS: Target date: 09/18/2023     To assess splint fit, and make appropriate adjustments to promote good skin integrity through the palmar surface of the bilateral hands.  Baseline: 05/25/22: Goal currently met, however ongoing as needs to assess splint fit arise. 03/23/2022: Pt. is wearing splints a couple of hours at night bilateral resting hand splints. 03/21/2022: Pt. is wearing splints a couple of hours at night bilateral resting hand splints. Goal status:  Deferred   LONG TERM GOALS: Target date:10/30/2023      FOTO score will Improve by 2 points for Pt. perceived improvement with the assessment specific ADL/IADL tasks.  Baseline:  03/08/23: 51; 12/28/2022: FOTO score: 56; 11/16/2022: 52 09/27/2022: 51 07/20/2022: FOTO 55 05/25/2022: FOTO score:  50,  TR score: 45 Eval: FOTO score: 40,  TR score: 45 Goal status: Achieved   2.   Pt. will independently perform oral care for 100% of the task after complete set-up. Baseline: 05/10/23: Pt. Performs 90% of the task. Pt.'s mother assists with 10% of the task for thorough cleaning the hard to reach places way in the back of the oral cavity.03/08/23: not as much assist required proximally, but to adjust positioning of toothbrush; 02/13/23: Pt. Continues to require assist proximally 01/09/2023: Continue 12/28/2022:  Pt. Continues to complete 90% of the task with complete set-up, with visual cues, and occassional assist of the right elbow. 11/16/2022: Pt. Completes 90% of the task with complete set-up. 09/26/2022: Completes 90% of the task, proximal assist at times required at the elbow.  08/15/2022: Completes 90% of the task, proximal assist at times required at the elbow. 07/20/22: completes 90% of task, limited by shoulder flexion. 05/25/2022:  Pt. Is able to initiate and perform oral care for approximately 90% of the task. Complete set-up required. Assi needed only for the very back teeth. 03/23/2022: Pt. Is able to initiate and perform oral care for approximately 75% of the task. Pt. Requires assist at proximally at the elbow for through oral care. 03/21/2022: Pt. Is able to initiate and perform oral care for approximately 75% of the task. Pt. Requires assist at proximally at the elbow for through oral care. Eval: Pt. is able to initiate using an electric toothbrush. Pt. requires assist for set-up, and assist for thoroughness, and as he Pt. fatigues. Goal status:  Partially met, Goal discontinued  3.  Pt. Will be modified  independence with self-feeding for 100% using a swivel spoon, and standard fork after complete set-up Baseline: 05/10/2023: Pt. feeds self 95-100% of the meal. with occasional assist to reposition the utensil. 03/08/23: indep with cup with a handle and lid, assist to reposition eating utensils in hand;  02/13/23:  Pt. Is now able to consistently, and efficiently use a swivel spoon for eating cereal with the left hand.01/09/2023: Continue 12/28/2022: Pt. Is now feeding himself 90% of the time using a swivel spoon with the left hand, and 95% of the time with the fork. Pt. Continues to have difficulty with cutting food. 11/16/2022: 100% with finger foods, Pt. Is initiating with a swivel spoon, and universal cuff for fork use, however requires assist for consistency, and accuracy.  09/26/2022: 90% using the spoon, assist at time with the forearm motion, and grip on the utensils. 100% for finger foods.  08/15/2022:  90% using the spoon, assit at time with the forearm motion, and grip on the utensils. 100% for finger foods-independent with set-up- unsupervised.  07/20/22: self-feeds cereal using spoon 90% of task. 05/25/2022: Pt. Is able to use a spoon to scoop cereal when feeding himself cereal 85% of the time. Pt. Is able to feed himself snack/finger foods 100% of the time. Pt. Continues to work on consistency of  stabilizing a cup/mug when drinking. Pt. Is able to grasp a water  bottle with assist initially, with assist tapering off as he drinks.03/23/2022: Pt. Is able to perform scooping cereal for 75% of the time. Pt. required assist, and support at the left elbow, and Pt. Presents with limited forearm supination when using the spoon, and bringing it towards his mouth. Pt. Is able to use a fork to spear items, and perform the hand to mouth pattern.  03/21/2022: Pt. Is able to perform scooping cereal for 75% of the time. Pt.  required assist, and support at the left elbow, and Pt. presents with limited forearm supination when  using the spoon, and bringing it towards his mouth. Pt. Is able to use a fork to spear items, and perform the hand to mouth pattern.  Eval: Pt. is able to hold standard standard utensils. Pt. Performs as much of the task as he, can and has assistance for the remainder. Goal status:  Improved, Achieved  4.  Pt. will improve grasp patterns and consistently grasp 1/4 objects for ADL, and IADL tasks.  Baseline: 08/27/23: Pt. is able to grasp flat discs, and sustain a secure hold on them while moving the UE through various planes. 08/07/23: Continue 05/31/2023: Continue 05/10/23: Pt. is consistently able to grasp 1/2 objects with visual cues. Pt. Requires assist, and increased visual cues to grasp 1/4 objects on an elevated plane.  Pt. Is unable to grasp the flat objects at the tabletop surface. Pt. Is grasping grasping small puppy vitamins.03/08/23: 1/2 objects efficiently; 02/13/2023: 1/2 objects efficiently 01/09/2023: Continue 12/28/2022: Continues to grasp 1/4 pegs with min a + visual cues, consistently grasping 1/2 objects with visual cues 11/16/2022: Continues to grasp 1/4 pegs with min a + visual cues, consistently grasping 1/2 objects with visual cues 09/26/2022: Continues to grasp 1/4 pegs with min a + visual cues, consistently grasping 1/2 objects with visual cues. 08/15/2022: grasps 1/4 pegs with min a + visual cues, consistently grasping 1/2 objects with visual cues. 07/20/22: grasps 1/4 pegs with min a + visual cues, consistently grasping 1/2 objects with visual cues. 05/25/2022: Pt. Is working on improving consistency of grasping 1/2 objects with visual cues.  03/23/2022: Pt. Is able to grasp 1 objects consistently,and continues to work on the hand patterns needed to grasp 1/2 objects.03/21/2022: Pt. Is able to grasp 1 objects consistently,and continues to work on the hand patterns needed to grasp 1/2 objects. Eval: Pt. is able to grasp 1 objects intermittently using a lateral grasp  pattern. Goal status: Ongoing  5.  Pt. will independently write his name legibly with letter sizes under 1. Baseline: 05/10/23: Deferred 03/08/23: Not addressed this period; 02/13/2023: Continue 01/09/2023: Continue. 12/28/2022: Pt. is now using an IPad Pen. 11/16/2022: Pt. Continues to be able to write name with smaller letter size. Pt. Is signing name with a computer styus. Pt. Requires visual cues, and assist 09/26/2022: Pt. Continues to be able to write name with smaller letter size. Pt. Is signing name with a computer styus. Pt. Requires visual cues, and assist. 08/15/2022: Pt. Is able to write name with smaller letter size. Pt. Is signing name with a computer styus. Pt. Requires visual cues, and assist. 07/20/22: stabilizing assist to write name with 75% legibility with 2 letters. 05/25/2022: Pt. to continue to work towards formulating grasp patterns in preparation for grasping a large width pen. Pt. Requires visual cues. 03/23/2022: Pt. Is able to write his name with modA, however has difficulty with formulating letter sizes less than 2 in size with 50% legibility for the 3 letters of his name.03/21/2022: Pt. Is able to write his name with modA, however has difficulty with formulating letter sizes less than 2 in size with 50% legibility for the 3 letters of his name. Eval: Pt is able to hold a thin marker with his left hand, and formulate a line, and initiate a circular pattern (Pt. without glasses today) Goal status: Deferred  6. Pt. Will reach up to comb/brush his hair with minA.  Baseline: 05/10/23: Pt. is able to  use a pick independently with the left hand with occasional assist to the far side of the left his head. 03/08/23: Pt uses pick in L hand to reach 75% of his head; unable to reach R side of head; 02/13/2023:Pt. is able to use a hair pick with the left hand on the right side of his head, top, and back of the head. Pt. Continues to have difficulty reaching further to the right side of his head  with the left. 01/09/2023: Continue 12/28/2022: Pt. Is progressing, and is now able to use a hair pick with the left hand on the right side of his head, top, and back of the head. Pt. Continues to have difficulty reaching further to the right side of his head with the left. 11/16/2022: Pt. Is able to use a hair pick for the left side of his head using his left hand. Pt. Presents with difficulty reaching to the right side of his head.09/26/2022: Pt. Is able to use a hair pick for the left side of his head using his left hand. Pt. Presents with difficulty reaching to the right side of his head. 5/13/202: 75% using a hair pick, assist to the right side of the head. 07/20/22: reaches 75% of head, assist for far R side of head, fatigues quickly. 05/25/2022: Pt. Is able to reach up with the left hand to the left side, top, and back of his head. 03/23/2022: Pt. is now able to more consistently initiate reaching up to his head with his left hand in preparation for haircare12/18/2023: Pt. is now able to more consistently initiate reaching up to his head with his left hand in preparation for haircare. Eval: Pt. is able to initiate reaching up for hair care with a long handled brush, however is unable to sustain UE's in elevation to perform the task.     Goal status: Achieved  7. Pt. Will independently navigate the w/c through his environment with minA with visual scanning, and hand placement on the controls.  Baseline: 11/16/2022: Deferred 2/2 vision 09/26/2022: Pt. Is now navigating his w/c ouside the home, and around his block. 08/15/2022: Pt. Requires minA to setup hand on controls and MIN cues to navigate the w/c in wide spaces, requires MIN - MOD cues to navigate the w/c through more narrow doorways, and tighter turns - varies based on good vs bad vision days.07/20/22: Pt. Requires minA to setup hand on controls and MIN cues to navigate the w/c in wide spaces, requires MIN - MOD cues to navigate the w/c through more narrow  doorways, and tighter turns - varies based on good vs bad vision days. 05/25/2022: Pt. Requires minA  and  cues to navigate the w/c in wide spaces, and requires Mod cues to navigate the w/c through more narrow doorways, and tighter turns. Pt. Requires max cues for scanning through the environment, and moderate cues for hand placement on the controls.     Goal status: Deferred 2/2 vision    8. Pt. Will improve bilateral grip strength to be able to independently grasp, and pull up blankets, and linens.   Baseline: 05/10/23: Deferred 03/08/23: R grip 0-5 lbs, L grip 8 lbs; pt reports he can consistently pull up blankets and linens independently.  02/13/2023: Continue. Pt. presents with digit PIP, and DIP flexor tightness. 01/09/2023: Continue 12/28/2023: R: 0#, L: 5# Pt. Is now able to more consistently grasp and pull blankets up over him. 11/16/2022: R: 0 L: 8# Pt. Is more consistently grasping his  blankets and attempting to pull them up. 09/26/2022: R: 0  L: 8# Pt. is attempting to grasp, and pull blankets up more, using mostly the left hand. 08/15/2022: Pt. has difficulty securely holding, and pulling up blankets, and linens.    Goal status:Deferred  9. Pt. will consistently actively control the releasing of blankets, covers, and linens from his hands once they are in the desired position over him. Baseline:12/28/2022: Pt. Is consistently actively able to release linens once they are in the desired position over him. 11/16/2022: Pt. continues to improve with actively releasing blankets form his hands. 09/26/2022: Pt. is improving with actively releasing blankets form his hands. 08/15/2022: Pt. has difficulty with controlled releasing of blankets/linens from his grasp.     Goal status: Achieved    10. Pt. Will improve bilateral UE strength by 2 MM grades to assist with ADLs, and IADLs  Baseline: 08/27/23:  Right: shoulder flexion: 3+/5, abduction: 4-/5, elbow flexion: 4+/5 , extension: 4-/5 wrist extension: 2/5;  Left: shoulder flexion: 4+/5, abduction: 4+/5, elbow flexion: 5/5 , extension: 4/5  wrist extension: 3/5  08/07/23: Continue 05/31/2023: Continue 05/10/23: Right: shoulder flexion: 3+/5, abduction: 4-/5, elbow flexion: 4+/5 , extension: 4-/5 wrist extension: 2/5; Left: shoulder flexion: 4+/5, abduction: 4+/5, elbow flexion: 5/5 , extension: 4/5  wrist extension: 3/5 03/13/2023: Right: shoulder flexion: 3/5, abduction: 4/5, elbow flexion: 4+/5 , extension: 4-/5 wrist extension: 2/5; Left: shoulder flexion: 4+/5, abduction: 4+/5, elbow flexion: 5/5 , extension: 4/5  wrist extension: 3/5 03/08/23: NT this date d/t as pt arrived late; 02/13/2023: Continue 01/09/2023: Continue 12/28/2022: Right: shoulder flexion: 3/5, abduction: 4/5, elbow flexion: 4+/5 , extension: 4-/5 wrist extension: 2/5; Left: shoulder flexion: 4+/5, abduction: 4+/5, elbow flexion: 5/5 , extension: 4/5  wrist extension: 3/5 Right: shoulder flexion: 3+/5, abduction: 4/5, elbow flexion: 4+/5 , extension: 4+/5 wrist extension: 2/5; Left: shoulder flexion: 4+/5, abduction: 4+/5, elbow flexion: 5/5 , extension: 4+/5 wrist extension: 3-/5 11/16/2022: Right: shoulder flexion: 3/5, abduction: 4/5, elbow flexion: 4+/5 , extension: 4+/5 wrist extension: 2/5; Left: shoulder flexion: 4+/5, abduction: 4+/5, elbow flexion: 5/5 , extension:  wrist extension: 3-/5 Right: shoulder flexion: 3+/5, abduction: 4/5, elbow flexion: 4+/5 , extension: 4+/5 wrist extension: 2/5; Left: shoulder flexion: 4+/5, abduction: 4+/5, elbow flexion: 5/5 , extension: 4+/5 wrist extension: 3-/5 Goal status: Ongoing  11. Pt. will improve right wrist extension by 10 degrees to initiate reaching for items in preparation for ADLs. Baseline: 08/23/2023: R Wrist Ext: -31(-8)  08/07/23: Continue 05/31/23: Continue 05/10/23: Right wrist extension: -32(22) 03/13/23: -22(35) 03/08/23: NT this date as pt arrived late; 02/13/2023: Continue 01/09/2023: Continue 12/28/2022: -22(34) 11/16/2022:  -30(10)    Goal status: Ongoing      12. Pt. Will require donn/doff a Tank top T-shirt efficiently with supervision and full set-up.    Baseline:08/23/23: Pt. Is able to independently doff his shirt, requiring assist to lean forward and loosen the back. 08/07/23: Continue 05/31/2023: Pt. Requires maxA donning a pullover sweatshirt, Mod-maxA doffing it. T-shirt tank TBD 05/10/23: Pt. Presents with difficulty completing, and requires increased time to complete    Goal status: Ongoing    13. Pt. Will independently perform bilateral/bimanual hand tasks needed to fold towels/linens/laundry Baseline:08/23/23: Pt. Continues to have difficulty using bilateral/bimanual hand tasks needed to fold towels/linens/laundry. 08/07/23: Continue 05/31/23: Continue 05/10/23:  Pt. Has difficulty folding towels/lines/laundry using bilateral hands Goal status:Ongoing  14. Pt. Will independently, and efficiently reach his right arm over the side of the chair to pet his dog.  Baseline: 08/23/2023: Pt. Is now able to reach over the side of the armrest of his recliner chair to pet his dog.  08/07/23: Continue 05/31/2023: Continue 05/10/23: Pt. is unable to efficiently reach his right UE over the side of his recliner chair to pet his dog.    Goal status: Achieved    15. Pt. Will independently, and efficiently reach out in front of him to efficiently place items onto his table while sitting in his recliner.     Baseline:  08/23/2023: Pt. has difficulty consistently reaching out in front of him to place items on the on a tabletop surface with the right hand. 08/07/23: Continue 2/26/225: Continue 05/10/23: Pt. has difficulty reaching out in front of him far enough to efficiently place items on the table while sitting in a recliner chair.    Goal status: Ongoing   ASSESSMENT:  CLINICAL IMPRESSION:    Pt.  Reports  not having any popping sensations in the elbow. Pt. Reports  tightness in the back form sitting up in his w/c all day  yesterday.  Pt. Is responding well to moist heat modality. Pt requires hand-over-hand assist to perform diagonal patterns of movement. Pt. Tolerated slow, prolonged, gentle stretching. Pt. continues to be highly motivated, and Pt.'s caregivers are very supportive to continue working towards improving BUE functioning, ROM, strength, motor control, [facility] skills, as well as visual compensatory strategies in order to maximize independence with daily ADLs, and IADL functioning.      PERFORMANCE DEFICITS in functional skills including ADLs, IADLs, coordination, dexterity, proprioception, ROM, strength, pain, FMC, GMC, decreased knowledge of use of DME, and UE functional use, cognitive skills including safety awareness, and psychosocial skills including coping strategies, environmental adaptation, habits, and routines and behaviors.   IMPAIRMENTS are limiting patient from ADLs, IADLs, leisure, and social participation.   COMORBIDITIES may have co-morbidities  that affects occupational performance. Patient will benefit from skilled OT to address above impairments and improve overall function.  MODIFICATION OR ASSISTANCE TO COMPLETE EVALUATION: Maximum or significant modification of tasks or assist is necessary to complete an evaluation.  OT OCCUPATIONAL PROFILE AND HISTORY: Comprehensive assessment: Review of records and extensive additional review of physical, cognitive, psychosocial history related to current functional performance.  CLINICAL DECISION MAKING: High - multiple treatment options, significant modification of task necessary  REHAB POTENTIAL: Fair    EVALUATION COMPLEXITY: High    PLAN: OT FREQUENCY: 2 x's a week  OT DURATION:12 weeks  PLANNED INTERVENTIONS: self care/ADL training, therapeutic exercise, therapeutic activity, neuromuscular re-education, manual therapy, passive range of motion, patient/family education, and cognitive remediation/compensation RECOMMENDED OTHER SERVICES:  PT  CONSULTED AND AGREED WITH PLAN OF CARE: Patient and family member/caregiver  PLAN FOR NEXT SESSION: Review HEP  Richardson Otter, MS, OTR/L  09/18/23

## 2023-09-20 ENCOUNTER — Encounter: Payer: Self-pay | Admitting: Occupational Therapy

## 2023-09-20 ENCOUNTER — Ambulatory Visit: Payer: Medicare Other | Admitting: Occupational Therapy

## 2023-09-20 ENCOUNTER — Ambulatory Visit: Payer: Medicare Other

## 2023-09-20 DIAGNOSIS — R278 Other lack of coordination: Secondary | ICD-10-CM | POA: Diagnosis not present

## 2023-09-20 DIAGNOSIS — I6930 Unspecified sequelae of cerebral infarction: Secondary | ICD-10-CM

## 2023-09-20 DIAGNOSIS — R2689 Other abnormalities of gait and mobility: Secondary | ICD-10-CM

## 2023-09-20 DIAGNOSIS — R262 Difficulty in walking, not elsewhere classified: Secondary | ICD-10-CM | POA: Diagnosis not present

## 2023-09-20 DIAGNOSIS — M6281 Muscle weakness (generalized): Secondary | ICD-10-CM

## 2023-09-20 DIAGNOSIS — R2681 Unsteadiness on feet: Secondary | ICD-10-CM

## 2023-09-20 DIAGNOSIS — R269 Unspecified abnormalities of gait and mobility: Secondary | ICD-10-CM | POA: Diagnosis not present

## 2023-09-20 NOTE — Therapy (Signed)
 OUTPATIENT PHYSICAL THERAPY TREATMENT    Patient Name: Paul Hall MRN: [id number] DOB:[birth year], 28 y.o., male Today's Date: 09/20/2023  PCP:  Waunita Haff PROVIDER:  Susie Erichsen, PA-C   PT End of Session - 09/20/23 2333     Visit Number 162    Number of Visits 184   date corrected from most recent recert   Date for PT Re-Evaluation 11/27/23    Authorization Type [employer] [place] [employer]    Authorization Time Period 01/04/21-03/29/21; Recert 03/24/2021-06/16/2021; Recert 09/15/2021- 12/08/2021; Recert 12/13/2021-03/07/2022    Progress Note Due on Visit 170    PT Start Time 1616    PT Stop Time 1700    PT Time Calculation (min) 44 min    Equipment Utilized During Treatment Gait belt    Activity Tolerance Patient tolerated treatment well    Behavior During Therapy WFL for tasks assessed/performed                           Past Medical History:  Diagnosis Date   Diabetes mellitus (HCC)    Hypertension    Stroke (HCC)    Past Surgical History:  Procedure Laterality Date   IVC FILTER PLACEMENT ([facility] HX)     LEG SURGERY     PEG TUBE PLACEMENT     TRACHEOSTOMY     Patient Active Problem List   Diagnosis Date Noted   Sepsis due to vancomycin  resistant Enterococcus species (HCC) 06/06/2019   SIRS (systemic inflammatory response syndrome) (HCC) 06/05/2019   Acute lower UTI 06/05/2019   VRE (vancomycin -resistant Enterococci) infection 06/05/2019   Anemia 06/05/2019   Skin ulcer of sacrum with necrosis of muscle (HCC)    Urinary retention    Type 2 diabetes mellitus without complication, with long-term current use of insulin  (HCC)    Tachycardia    Lower extremity edema    Acute metabolic encephalopathy    Obstructive sleep apnea    Morbid obesity with BMI of 60.0-69.9, adult (HCC)    Goals of care, counseling/discussion    Palliative care encounter    Sepsis (HCC) 04/27/2019   H/O insulin  dependent diabetes mellitus 04/27/2019   History of  CVA with residual deficit 04/27/2019   Seizure disorder (HCC) 04/27/2019   Decubitus ulcer of sacral region, stage 4 (HCC) 04/27/2019    REFERRING DIAG: Cerebral infarction, unspecified   THERAPY DIAG:  Muscle weakness (generalized)  Other lack of coordination  Difficulty in walking, not elsewhere classified  Abnormality of gait and mobility  Unsteadiness on feet  Other abnormalities of gait and mobility  History of CVA with residual deficit  Rationale for Evaluation and Treatment Rehabilitation  PERTINENT HISTORY: Paul Hall is a 26yoM who presents with severe weakness, quadriparesis, altered sensorium, and visual impairment s/p critical illness and prolonged hospitalization. Pt hospitalized in October 2020 with ARDS 2/2 COVID19 infection. Pt sustained a complex and lengthy hospitalization which included tracheostomy, prolonged sedation, ECMO. In this period pt sustained CVA and SDH. Pt has now been liberated from tracheostomy and G-tube. Pt has since been hospitalized for wound infection and UTI. Pt lives with parents at home, has hospital bed and left chair, hoyer lift transfers, and power WC for mobility needs. Pt needs heavy physical assistance with ADL 2/2 BUE contractures and motor dysfunction   PRECAUTIONS: Fall  SUBJECTIVE:  Patient reports feeling tight and states he was on his side at home last night and his right LE slipped of the side  and he reported that he was able to lift it up and place it back on the bed himself.    PAIN:  Are you having pain? No. But feeling tight.    TODAY'S TREATMENT:  - 09/20/2023      *Patient was dependently transferred from power w/c to supine on mat and later back from mat to power w/c (using personal hoyer pad) - hoyer lift       TE:  PROM to BLE:  -ankle, hip flex/ext/abd/add/IR/ER, knee flext/ext x 24 min total.  -AROM heel slides 2 x 10 reps ea LE -AAROM hip abd/add x 12 reps ea side *Patient reported being uncomfortable  with prolonged laying- attempted to make more comfortable by removing wedge yet patient still uncomfortable.       PATIENT EDUCATION: Education details: Exercise technique with VC   HOME EXERCISE PROGRAM:  Access Code: [email or web address] URL: [email or web address] Date: 03/23/2022 Prepared by: Ferrell Hu  Exercises - Supine Bridge  - 3 x weekly - 3 sets - 10 reps - 2 hold - Supine Gluteal Sets  - 3 x weekly - 3 sets - 10 reps - 5 sec hold - Supine Quad Set  - 1 x daily - 3 x weekly - 3 sets - 10 reps - 5 hold - Seated Long Arc Quad  - 1 x daily - 7 x weekly - 3 sets - 10 reps - Seated Hip Adduction Squeeze with Ball  - 1 x daily - 3 x weekly - 3 sets - 10 reps - 5 hold - Seated Hip Abduction  - 1 x daily - 3 x weekly - 3 sets - 10 reps - 2 hold    PT Short Term Goals -       PT SHORT TERM GOAL #1   Title Pt will be independent with HEP in order to improve strength and balance in order to decrease fall risk and improve function at home and work.    Baseline 01/04/2021= No formal HEP in place; 12/12 no HEP in place; 05/10/2021-Patient and his father were able to report compliance with curent HEP consisting of mostly seated/reclined LE strengthening. Both verbalize no questions at this time.    Time 6    Period Weeks    Status Achieved    Target Date 02/15/21            PT LONG TERM GOAL #1     Title Patient will increase BLE gross strength by 1/2 muscle grade to improve functional strength for improved independence with potential gait, increased standing tolerance and increased ADL ability.     Baseline 01/04/2021- Patient presents with 1/5 to 3-/5 B LE strength with MMT; 12/12: goal partially met for Left knee/hip; 05/10/2021= 2-/5 bilateal Hip flex; 3+/5 bilateral Knee ext; 06/21/2021= Patient presents with 2-/5 bilateral Hip flex; 3+/5 bilateral knee ext/flex; 2-/5 left ankle DF; 0/5 right ankle- and able to increase reps and resistance with LE's. 09/15/2021- Patient  technically presents with 2-/5 B hip flex/abd/add - but he is able to raise his hip up to approx 100 deg which has improved. 3+/5 Bilateral knee ext, 2-/5 left ankle and 0/5 right ankle.  12/08/2021= Patient able to lift left knee at 110 deg of hip flex; presents with 3+/5 knee ext, 2-/5 left ankle DF and 0/5 right ankle DF, 2-/5 bilateral Hip abd in seated position.    12/6: R: knee 3+/5 ext, 2/5 flexion, left knee 3+/5 extension, 3+/5 flexion, R hip: 2+/5 hip add,  2+/5 hip ABD L hip: 4-/5 hip ABD, 3+/5 hip ADD, 3+/5 hip flexion; 06/06/2022= Patient now presents with 2-/5 right ankle DF/PF;     Time 12     Period Weeks     Status MET    Target Date 03/07/2022         PT LONG TERM GOAL #2    Title Patient will tolerate sitting unsupported demonstrating erect sitting posture for 15 minutes with CGA to demonstrate improved back extensor strength and improved sitting tolerance.     Baseline 01/04/2021- Patient confied to sitting in lift chair or electric power chair with back support and unable to sit upright without physical assistance; 12/12: tolerates <1 minutes upright unsupported sitting. 05/10/2021=static sit with forward trunk lean  in his power wheelchair without back support x approx 3 min. 06/21/2021=Unable to assess today due to patient with acute back pain but on previous visit able to sit x 8 min without back support. 09/15/2021- on last visit- 09/13/2021- patient was able to sit unsupported x 8 min at edge of mat. 10/13/2023 - Patient was able to sit at edge of mat with varying level of assist today from SBA to min A for a total of 20 min. 12/13/2021= Patient demonstrated unsupported sitting at edge of mat for approx 20 min    Time 12     Period Weeks     Status GOAL MET    Target Date 12/08/21          PT LONG TERM GOAL #3    Title Patient will demonstrate ability to perform static standing in // bars > 2 min with Max Assist  without loss of balance and fair posture for improved overall strength for  pre-gait and transfer activities.     Baseline 01/04/2021= Patient current uanble to stand- Dependent on hoyer or sit to stand lift for transfers. 05/10/2021=Not appropriate yet- Currently still dependent with all transfers using hoyer. 06/21/2021= Patient continuing now to focus on LE strengthening to prepare for standing-unable to try today due to acute low back pain-  planning on attempting in new cert period. 09/15/2021- Patient has attempted standing 2x in past two week- max Assist of 2 people - only once was he successful to clearing his bottom from chair - Will continue to be a focus during the new certification. 12/13/2021= Patient has been limited secondary to increased overall low back pain during this certification and will require more time to focus on this goal.  12/6: not assessed this date, will assess at date when 2-3 PTs are present for assistance     Time 12     Period Weeks     Status GOAL not appropriate at this time - may attempt in future once Patient presents with improved overall LE strength.                 PT LONG TERM GOAL #4    Title Pt will improve FOTO score by 10 points or more demonstrating improved perceived functional ability     Baseline FOTO 7 on 10/17; 03/15/21: FOTO 12; 05/10/2021 06/21/2021= 1; 09/15/2021= 9; 12/13/2021= Will issue next visit 12/6: 4; 06/06/2022= Will assess next visit; Goal not appropriate as patient is not ambulatory.     Time 12     Period Weeks     Status WITHDRAWN    Target date 11/30/2022         PT LONG TERM GOAL #5    Title Patient will perform  sit to stand transfer with appropriate AD and max assist of 2 people with 75% consistency to prepare for pregait activities.     Baseline 09/15/2021= Patient unable to stand well- unable to clear his bottom off chair with Max assist of 2 persons. 12/13/2021- Goal not appropriate to try yet but will keep and roll over to next cert as shift continues to focus on transfers/standing; 4/24= Patient able to perform  active ankle DF/PF with right LE and able to raise his knee into seated march and clear floor without physical assist today - as previously unable as well as lift right knee ext to near full ROM to improve strength for eventual standing. 09/07/2022- Goal continues to not be appropriate but patient is now standing some and will keep goal active; 11/23/2022= patient is now performing sit to stand with max A +2 and now able to clear his bottom off mat. 11/30/2022- Patient continues to perform sit to stand transfers with Max A+2 and able to clear bottom well off mat but not erect yet. 05/31/2023- Patient is able to stand consistently with max A +2 - unable to achieve full knee or trunk ext yet is able to clear bottom off mat for improved LE weightbearing. 09/04/2023- Patient performed several Sit to stand requiring max A+2  with improving posture- able to ext R knee better - near terminal knee and able to clear bottom off mat- still not full but able to contract glutes for standing as erect as possible.     Time 12     Period Weeks     Status PROGRESSING    Target date  11/27/2023       PT LONG TERM GOAL #6  Title Patient will tolerate sitting unsupported demonstrating erect sitting posture for 30 minutes with CGA to demonstrate improved back extensor strength and improved sitting tolerance.   Baseline 12/13/2021= Patient demonstrated unsupported sitting at edge of mat for approx 20 min;  12/29/2021- Patient performed approx 30 min of dynamic sitting activities today. 06/06/2022= Patient demonstrated ability to sit and perform static and dynamic UE/LE movement with only Supervision.   Time 12   Period Weeks   Status GOAL MET           7.  Patient will tolerate 2 minutes or more of standing in sit to stand lift or with max assist +2 in order to indicate improved lower extremity weightbearing tolerance for progression to standing in parallel bars. Baseline: 1 minute on most recent stand 02/21/22; 06/06/2022- Patient  did attempt today after complaining of right LE pain last week. He attempted 3 stands using sit to stand lift. - all over 1 min- last one approx 48 sec- stopped due to fatigue. More erect standing in lift today - still poor gluteal strength but able to activate glutes and extend hips upon command but unable to hold > 5 sec. 07/28/2022- Will attempt standing next visit but patient has been able to stand since last progress note for up to 3 min at a time at his best. 09/07/2022-  attempted standing with max +2 and patient able to stand 15-20 sec so will now  revise goal; 11/23/2022- Patient at his best between last 2 visits was able to stand approx 50 sec with max A+2.  11/30/2022- Patient performed 4 trials of static standing - max A +2- clearing bottom and using forearms to bear weight  03/15/2023: Patient performed 2 trials of static standing - max A +2- clearing edge  of mat and using forearms to bear weight for approximately 45 seconds, is able to come into approximately 45 degrees of knee flexion from sit to stand; 05/31/2023- Patient has not attempted standing in 1 month but able to perform x 4 trials with max A +2 (PT and his mom) and able to clear his bottom off mat and attempted to activate his glutes and apply some forearm pressure - able to stand between 25-40 sec today. 09/04/2023- Patient performed 3 trials of standing ranging from 17 -47 sec - improving right knee terminal knee ext and more activation upon command with gluteals- able to clear mat table. Continued to require Max A+2 and blocking knees with forearm/trunk support.   Goal status: PROGRESSING Target date: 11/27/2023   8. Patient will tolerate sitting unsupported demonstrating ability to perform dynamic UE/LE activities for 30 minutes  independently to demonstrate ability to sit at edge of bed to eat or perform some ADL's/exercise for optimal quality of life.             Baseline: 06/06/2022- Patient able to sit and perform some UE/LE  exercises but requires CGA at times for safety- he is able to static sit for 30 min with supervision. 07/27/2022= Patient able to now sit > 30 min with Supervision only - performing static and dynamic activities. Will keep goal active to ensure patient is consistent. 09/07/2022=  Patient able to demonstrate consistent ability to sit at edge of mat during visits             Goal Status: MET             Target date: 08/29/2022  9.  Patient will perform rolling left to right with Mod assist for improved independence with bed mobility to assist with dressing and positioning in bed. Baseline: 09/04/2023- Mother reported last visit that he was having some difficulty with assisting with bed mobility. Goal status: NEW Target date: 11/27/2023    10. Patient will perform active bridging -able to clear bottom   Off mat for 10 reps with PT/caregiver holding his feet    For improved trunk ext strength with standing and to    To assist with be mobility/positioning.  Baseline: 09/04/2023= (PT assisted- holding right LE In neutral and pressure on each foot) - Difficulty clearing bottom off mat. Goal status: NEW Target date: 11/27/2023           Plan     Clinical Impression Statement  Treatment continued with some LE strengthening/ROM/bed mobility in supine today. Patient was uncomfortable with lying down unable to really clarify what exactly was uncomfortable. He presented with increased overall tightness in RLE with difficulty relaxing. He was able to perform some LE ROM/strengthening but ultimately fatigued and requiring more physical assist than previous session. Pt will continue to benefit from skilled physical therapy intervention to address impairments, improve QOL, and attain therapy goals.    Personal Factors and Comorbidities Comorbidity 3+;Time since onset of injury/illness/exacerbation    Comorbidities CVA, diabetes, Seizures    Examination-Activity Limitations Bathing;Bed Mobility;Bend;Caring for  Others;Carry;Dressing;Hygiene/Grooming;Lift;Locomotion Level;Reach Overhead;Self Feeding;Sit;Squat;Stairs;Stand;Transfers;Toileting    Examination-Participation Restrictions Cleaning;Community Activity;Driving;Laundry;Medication Management;Meal Prep;Occupation;Personal Finances;Shop;Yard Work;Volunteer    Stability/Clinical Decision Making Evolving/Moderate complexity    Rehab Potential Fair    PT Frequency 1-2x / week    PT Duration 12 weeks    PT Treatment/Interventions ADLs/Self Care Home Management;Cryotherapy;Electrical Stimulation;Moist Heat;Ultrasound;DME Instruction;Gait training;Stair training;Functional mobility training;Therapeutic exercise;Balance training;Patient/family education;Orthotic Fit/Training;Neuromuscular re-education;Wheelchair mobility training;Manual techniques;Passive range of motion;Dry needling;Energy conservation;Taping;Visual/perceptual remediation/compensation;Joint  Manipulations    PT Next Visit Plan core strength/motor control in short-sitting, sit to stand if able; Continue with progressive LE Strengthening. Stand activities as appropriate. Attempt Stedy (sit to stand device) in future visits. Recert next visit.    PT Home Exercise Plan No changes to HEP today    Consulted and Agree with Plan of Care Patient;Family member/caregiver    Family Member Consulted             03/09/2023, 8:04 AM      11:43 PM, 09/20/23 [employer]ene [employer] PT Physical Therapist - [facility]  11:43 PM 09/20/23

## 2023-09-20 NOTE — Therapy (Signed)
 . Occupational Therapy Neuro Treatment Note  Patient Name: Paul Hall MRN: [id number] DOB:05/11/1995, 28 y.o., male Today's Date: 09/20/2023  PCP: Dr. Lewayne Records REFERRING PROVIDER: Dr. Lewayne Records    OT End of Session - 09/20/23 1625     Visit Number [id number]    Date for OT Re-Evaluation 10/30/23    OT Start Time 1537    OT Stop Time 1615    OT Time Calculation (min) 38 min    Equipment Utilized During Treatment tilt in space power wc    Activity Tolerance Patient tolerated treatment well    Behavior During Therapy WFL for tasks assessed/performed                      Past Medical History:  Diagnosis Date   Diabetes mellitus (HCC)    Hypertension    Stroke [facility])    Past Surgical History:  Procedure Laterality Date   IVC FILTER PLACEMENT ([facility] HX)     LEG SURGERY     PEG TUBE PLACEMENT     TRACHEOSTOMY     Patient Active Problem List   Diagnosis Date Noted   Sepsis due to vancomycin  resistant Enterococcus species (HCC) 06/06/2019   SIRS (systemic inflammatory response syndrome) (HCC) 06/05/2019   Acute lower UTI 06/05/2019   VRE (vancomycin -resistant Enterococci) infection 06/05/2019   Anemia 06/05/2019   Skin ulcer of sacrum with necrosis of muscle (HCC)    Urinary retention    Type 2 diabetes mellitus without complication, with long-term current use of insulin  (HCC)    Tachycardia    Lower extremity edema    Acute metabolic encephalopathy    Obstructive sleep apnea    Morbid obesity with BMI of 60.0-69.9, adult (HCC)    Goals of care, counseling/discussion    Palliative care encounter    Sepsis (HCC) 04/27/2019   H/O insulin  dependent diabetes mellitus 04/27/2019   History of CVA with residual deficit 04/27/2019   Seizure disorder (HCC) 04/27/2019   Decubitus ulcer of sacral region, stage 4 (HCC) 04/27/2019   ONSET DATE: 01/2019  REFERRING DIAG: CVA/COVID-19  THERAPY DIAG:  Muscle weakness (generalized)  Rationale for Evaluation and Treatment  Rehabilitation  SUBJECTIVE:   SUBJECTIVE STATEMENT:  Pt. Reports that he uses a cooking utensil to crush cookies for their Father's Day dessert.  Pt accompanied by: self and family member  PERTINENT HISTORY:  Pt. is a 28 y.o. male who was diagnosed with COVID-19, and CVA with resultant quadriplegia. Pt. Was then hospitalized with VRE UTI. PMHx includes: urinary retention, seizure disorder, obstructive sleep disorder, DM Type II, Morbid obesity.   PRECAUTIONS: None  WEIGHT BEARING RESTRICTIONS No  PAIN:  No reports of pain Are you having pain? no  FALLS: Has patient fallen in last 6 months? No  LIVING ENVIRONMENT: Lives with: lives with their family Lives in: House/apartment Stairs: No Level Entry Has following equipment at home: Wheelchair (power) and hoyer lift, sit to stand lift  PLOF: Independent  PATIENT GOALS: To be able to engage in more daily care tasks.  OBJECTIVE:   HAND DOMINANCE: Right  ADLs:  Transfers/ambulation related to ADLs: Eating: Pt. reports being able to hold standard utensils, and is starting to engage more in self-feeding tasks, hand to mouth patterns. Pt. Reports that he does as much as he can with the task, and family assists  with the remainder of the task. Grooming: Pt. Is able to initiate holding an electric toothbrush, and brush his teeth. Family assists  LB Dressing: Total Assist UE dressing: Pt. is now able to reach up to actively assist with grasping , and pulling his gown down. Toileting:  Total Assist Bathing: MaxA UB, Total assist LB Tub Shower transfers: N/A Equipment: See above    IADLs: Shopping: Relies on family to assist Light housekeeping: Total Assist Meal Prep: Total Assist Community mobility:   Medication management:  Total Assist  Financial management: N/A Handwriting: Not legible: Pt. Is able to hold a pen with the left hand, and initiate marking the page. Pt.'s eye glasses were not available  MOBILITY STATUS:   Power w/c  POSTURE COMMENTS:  Pt. Requires position changes in his power w/c  ACTIVITY TOLERANCE: Activity tolerance:  Fair  FUNCTIONAL OUTCOME MEASURES: FOTO: 40 TR score: 45  05/25/2022:   FOTO: 50 TR score: 45  07/20/22: FOTO 55  03/08/23: 51  UPPER EXTREMITY ROM     Active ROM Left eval Left  03/21/2022 Left 05/25/2022 L  07/20/22 Left 07/25/2022 Left  08/15/2022 Left  09/26/2022 Left  11/16/2022 Left 12/28/2022 Left 03/13/23 Left 05/10/23  Shoulder flexion 119 123 125 130  136 138 130 142 144 144  Shoulder abduction 110 115 115 115  118 121 125 142 140 140  Shoulder adduction             Shoulder extension             Shoulder internal rotation             Shoulder external rotation             Elbow flexion 135(135) 145 145 145  145    145 145  Elbow extension -27(-18) -21(-20) -20(-16)  -25(-10) -23(-10) -21(-10) -20(-18) -20(-18) -20(-17) -17(-16)  Wrist flexion             Wrist extension 10(50) 19(50) 28(50)  30(50) 35(55) 36(55) 36(60) 40(60) 45(60) 45(60)  Wrist ulnar deviation             Wrist radial deviation             Wrist pronation             Wrist supination   Limited by flexor tone       10(15)   (Blank rows = not tested)    Active ROM Right eval Right 03/21/2022 Right 05/25/2022 R  07/20/22 Right  07/25/22 Right 08/15/2022 Right 09/26/2022 Right 11/16/2022 Right 12/28/2022 Right 03/13/23 Right 05/10/23  Shoulder flexion 106 scaption 105  Scaption 62 flexion 110 scaption 100  105 105 105 112 Scaption 123 Scaption 122  Scaption  Shoulder abduction 114 90 97 100  105 117 120 124 124 122  Shoulder adduction             Shoulder extension             Shoulder internal rotation             Shoulder external rotation             Elbow flexion 120(130) 145 145 140  144 140 142 148 148 145  Elbow extension -45(-35) -22(-35) -28(-22)  -32(-21) -32(-28) -28(-26) -28(-28) -27(-26) -27(-40) -38(-35)  Wrist flexion             Wrist extension -30(10)  -25(10) -10(30)  -10(20) -10(25) -20(40) -30(10) -22(34) -22(35) -32(22)  Wrist ulnar deviation             Wrist radial deviation  Wrist pronation             Wrist supination   Limited by flexor tone            12/28/2022: Thumb radial abduction: Right: 12(46), Left: 40(54)  Digits: Bilateral 2nd through 5th digit: MP Hyperextension with PIP, an DIP flexion     UPPER EXTREMITY MMT:     Left eval Left 03/21/2022 Left  05/25/2022 L 07/20/22 Left  08/15/2022 Left 09/26/2022 Left 11/16/2022 Left 12/28/2022 Left  03/13/2023 Left  05/10/23  Shoulder flexion 3-/5 3/5 3+/5 4+/5 4+/5 4+/5 4+/5 4+/5 4+/5 4+/5  Shoulder abduction 3-/5 3-/5 4-/5 4+/5 4+/5 4+5 4+/5 4+/5 4+/5 4+/5  Shoulder adduction            Shoulder extension            Shoulder internal rotation            Shoulder external rotation            Elbow flexion 3/5 3+/5 4-/5 4+/5 5/5 5/5 5/5 5/5 5/5 5/5  Elbow extension 2-/5 2/5  4+/5 4+/5 4+/5  4/5 4/5 4/5 through available range  Wrist flexion            Wrist extension 2/5 2+/5 3-/5  3-/5 3-/5 3-/5 3/5 3/5 3/5  Wrist ulnar deviation            Wrist radial deviation            Wrist pronation            Wrist supination            (Blank rows = not tested)  Right eval Right 03/21/2022 Right  05/25/2022 R 07/20/22 Right 08/15/2022 Right 09/26/2022 Right 11/16/2022 Right 12/28/2022 Right 03/13/2023 Right 05/10/23  Shoulder flexion 3-/5 scaption 3-/5 3-/5 3+/5 3+/5 3+/5 3/5 3/5 3/5 3+/5  Shoulder abduction 3-/5 3-/5 3-/5 4/5 4/5 4/5 4/5 4/5 4/5 4/-5  Shoulder adduction            Shoulder extension            Shoulder internal rotation            Shoulder external rotation            Elbow flexion 3/5 3/5 3+/5 4/5 4+/5 4+/5 4+/5 4+/5 4+/5 4+/5  Elbow extension 2-/5 2/5 3-/5 4+/5 4+/5 4+/5 4+/5 4-/5 4-/5 4-/5  Wrist flexion            Wrist extension 2-/5 2-/5 2/5 2/5 2/5 2/5 2/5 2/5 2/5 2/5  Wrist ulnar deviation            Wrist radial deviation             Wrist pronation            Wrist supination              HAND FUNCTION:  Grip strength: Right: 0 lbs; Left: 0 lbs and Lateral pinch: Right: 5 lbs, Left: 2 lbs  03/21/2022: Lateral pinch: Right: 3.5 lbs, Left: 2 lbs  07/20/22: Grip strength: Right: 3 lbs; Left: 4 lbs and Lateral pinch: Right: 5 lbs, Left: 3 lbs  08/15/2022:  Grip strength: Right: 0 lbs; Left: 5 lbs and Lateral pinch: Right: 2 lbs, Left: 4 lbs  09/26/2022  Grip strength: Right: 0 lbs; Left: 8 lbs and Lateral pinch: Right: 2 lbs, Left: 5 lbs  11/16/2022  Grip strength: Right: 0 lbs; Left: 8 lbs  9/25/20204  Grip strength: Right: 0 lbs; Left: 8 lbs  Grip strength: Right: 0 lbs; Left: 8 lbs   03/08/23:  Grip strength: Right: 0 lbs without PROM, 5 lbs after PROM, Left 8 lbs  05/10/23:  Grip strength: TBD  Bilateral digit PIP/DIP flexion contractures with MP hyperextension with attempts for AROM. Pt. is able to tolerate AROM to the bilateral digits at the initial evaluation however, has a history of pain in the digits.  COORDINATION: Eval: Pt. is unable to grasp 9-hole test pegs. Pt. is able to initiate grasping larger pegs, and is able to hold a pen in the left hand.  07/20/22: 2 min 36 seconds to remove 9 pegs from 9 hole peg test - cues to locate pegs 2/2 low vision. Pt. is able to initiate grasping larger pegs on R hand and is able to hold a pen in the left hand.  08/15/2022:    Vision, and sensation limiting accuracy of 9 hole peg test results. Pt. Was able to grasp and remove vertical pegs intermittently with cues.    05/10/23: TBD  SENSATION: Light touch: Impaired   EDEMA:  N/A  MUSCLE TONE: BUE flexor Spasticity  COGNITION Overall cognitive status: Continue to assess in functional context  VISION:   Subjective report: Pt. was not wearing glasses at the time of the initial eval.  Baseline vision: Vision is very limited. Wears glasses all the time Visual history: History of impaired vision  following CVA. Pt. Has received treatment through the [facility] low vision rehabilitation program.   VISION ASSESSMENT: Impaired To be further assessed in functional context  PERCEPTION: Impaired   PRAXIS: Impaired: motor planning  OBSERVATIONS:  Pt reports being on Tramadol   TODAY'S TREATMENT  09/04/23   Therapeutic Ex.:  -Emphasis was placed on slow prolonged gentle passive stretching for RUE, forearm supination, bilateral wrist extension, wrist extension, bilateral digit MP, PIP, and DIP extension following moist heat, and left wrist flexion/extension, PIP/DIP flexion, and extension while heat was applied to the RUE. PROM was performed to decrease stiffness, and prepare the RUE for functional use. ROM was performed to the shoulder right elbow, forearm, wrist, and digits. PROM for bilateral digit MP, PIP, and DIP extension in preparation for placing them onto a flat surface.  Therapeutic Activities:   -Facilitated right hand [facility] skills grasping 1.5 flat discs from the therapist's hand, and reaching up into flexion, then out into abduction, and followed by reaching out from front of him to place the through a targeted slot.      PATIENT EDUCATION: Education details: stretching, PROM, massage to the digits on the bilateral hands. Person educated: Patient and Parent Education method: Explanation Education comprehension: verbalized understanding  HOME EXERCISE PROGRAM:  Continue ongoing assessment, and continue to provide as needed.   GOALS: Goals reviewed with patient? Yes  SHORT TERM GOALS: Target date: 09/18/2023     To assess splint fit, and make appropriate adjustments to promote good skin integrity through the palmar surface of the bilateral hands.  Baseline: 05/25/22: Goal currently met, however ongoing as needs to assess splint fit arise. 03/23/2022: Pt. is wearing splints a couple of hours at night bilateral resting hand splints. 03/21/2022: Pt. is wearing splints a couple of  hours at night bilateral resting hand splints. Goal status: Deferred   LONG TERM GOALS: Target date:10/30/2023      FOTO score will Improve by 2 points for Pt. perceived improvement with the assessment specific ADL/IADL tasks.  Baseline:  03/08/23: 51; 12/28/2022:  FOTO score: 56; 11/16/2022: 52 09/27/2022: 51 07/20/2022: FOTO 55 05/25/2022: FOTO score: 50,  TR score: 45 Eval: FOTO score: 40,  TR score: 45 Goal status: Achieved   2.   Pt. will independently perform oral care for 100% of the task after complete set-up. Baseline: 05/10/23: Pt. Performs 90% of the task. Pt.'s mother assists with 10% of the task for thorough cleaning the hard to reach places way in the back of the oral cavity.03/08/23: not as much assist required proximally, but to adjust positioning of toothbrush; 02/13/23: Pt. Continues to require assist proximally 01/09/2023: Continue 12/28/2022:  Pt. Continues to complete 90% of the task with complete set-up, with visual cues, and occassional assist of the right elbow. 11/16/2022: Pt. Completes 90% of the task with complete set-up. 09/26/2022: Completes 90% of the task, proximal assist at times required at the elbow.  08/15/2022: Completes 90% of the task, proximal assist at times required at the elbow. 07/20/22: completes 90% of task, limited by shoulder flexion. 05/25/2022:  Pt. Is able to initiate and perform oral care for approximately 90% of the task. Complete set-up required. Assi needed only for the very back teeth. 03/23/2022: Pt. Is able to initiate and perform oral care for approximately 75% of the task. Pt. Requires assist at proximally at the elbow for through oral care. 03/21/2022: Pt. Is able to initiate and perform oral care for approximately 75% of the task. Pt. Requires assist at proximally at the elbow for through oral care. Eval: Pt. is able to initiate using an electric toothbrush. Pt. requires assist for set-up, and assist for thoroughness, and as he Pt. fatigues. Goal status:   Partially met, Goal discontinued  3.  Pt. Will be modified independence with self-feeding for 100% using a swivel spoon, and standard fork after complete set-up Baseline: 05/10/2023: Pt. feeds self 95-100% of the meal. with occasional assist to reposition the utensil. 03/08/23: indep with cup with a handle and lid, assist to reposition eating utensils in hand;  02/13/23:  Pt. Is now able to consistently, and efficiently use a swivel spoon for eating cereal with the left hand.01/09/2023: Continue 12/28/2022: Pt. Is now feeding himself 90% of the time using a swivel spoon with the left hand, and 95% of the time with the fork. Pt. Continues to have difficulty with cutting food. 11/16/2022: 100% with finger foods, Pt. Is initiating with a swivel spoon, and universal cuff for fork use, however requires assist for consistency, and accuracy.  09/26/2022: 90% using the spoon, assist at time with the forearm motion, and grip on the utensils. 100% for finger foods.  08/15/2022:  90% using the spoon, assit at time with the forearm motion, and grip on the utensils. 100% for finger foods-independent with set-up- unsupervised.  07/20/22: self-feeds cereal using spoon 90% of task. 05/25/2022: Pt. Is able to use a spoon to scoop cereal when feeding himself cereal 85% of the time. Pt. Is able to feed himself snack/finger foods 100% of the time. Pt. Continues to work on consistency of  stabilizing a cup/mug when drinking. Pt. Is able to grasp a water  bottle with assist initially, with assist tapering off as he drinks.03/23/2022: Pt. Is able to perform scooping cereal for 75% of the time. Pt. required assist, and support at the left elbow, and Pt. Presents with limited forearm supination when using the spoon, and bringing it towards his mouth. Pt. Is able to use a fork to spear items, and perform the hand to mouth pattern.  03/21/2022:  Pt. Is able to perform scooping cereal for 75% of the time. Pt. required assist, and support at the left  elbow, and Pt. presents with limited forearm supination when using the spoon, and bringing it towards his mouth. Pt. Is able to use a fork to spear items, and perform the hand to mouth pattern.  Eval: Pt. is able to hold standard standard utensils. Pt. Performs as much of the task as he, can and has assistance for the remainder. Goal status:  Improved, Achieved  4.  Pt. will improve grasp patterns and consistently grasp 1/4 objects for ADL, and IADL tasks.  Baseline: 08/27/23: Pt. is able to grasp flat discs, and sustain a secure hold on them while moving the UE through various planes. 08/07/23: Continue 05/31/2023: Continue 05/10/23: Pt. is consistently able to grasp 1/2 objects with visual cues. Pt. Requires assist, and increased visual cues to grasp 1/4 objects on an elevated plane.  Pt. Is unable to grasp the flat objects at the tabletop surface. Pt. Is grasping grasping small puppy vitamins.03/08/23: 1/2 objects efficiently; 02/13/2023: 1/2 objects efficiently 01/09/2023: Continue 12/28/2022: Continues to grasp 1/4 pegs with min a + visual cues, consistently grasping 1/2 objects with visual cues 11/16/2022: Continues to grasp 1/4 pegs with min a + visual cues, consistently grasping 1/2 objects with visual cues 09/26/2022: Continues to grasp 1/4 pegs with min a + visual cues, consistently grasping 1/2 objects with visual cues. 08/15/2022: grasps 1/4 pegs with min a + visual cues, consistently grasping 1/2 objects with visual cues. 07/20/22: grasps 1/4 pegs with min a + visual cues, consistently grasping 1/2 objects with visual cues. 05/25/2022: Pt. Is working on improving consistency of grasping 1/2 objects with visual cues.  03/23/2022: Pt. Is able to grasp 1 objects consistently,and continues to work on the hand patterns needed to grasp 1/2 objects.03/21/2022: Pt. Is able to grasp 1 objects consistently,and continues to work on the hand patterns needed to grasp 1/2 objects. Eval: Pt. is able to  grasp 1 objects intermittently using a lateral grasp pattern. Goal status: Ongoing  5.  Pt. will independently write his name legibly with letter sizes under 1. Baseline: 05/10/23: Deferred 03/08/23: Not addressed this period; 02/13/2023: Continue 01/09/2023: Continue. 12/28/2022: Pt. is now using an IPad Pen. 11/16/2022: Pt. Continues to be able to write name with smaller letter size. Pt. Is signing name with a computer styus. Pt. Requires visual cues, and assist 09/26/2022: Pt. Continues to be able to write name with smaller letter size. Pt. Is signing name with a computer styus. Pt. Requires visual cues, and assist. 08/15/2022: Pt. Is able to write name with smaller letter size. Pt. Is signing name with a computer styus. Pt. Requires visual cues, and assist. 07/20/22: stabilizing assist to write name with 75% legibility with 2 letters. 05/25/2022: Pt. to continue to work towards formulating grasp patterns in preparation for grasping a large width pen. Pt. Requires visual cues. 03/23/2022: Pt. Is able to write his name with modA, however has difficulty with formulating letter sizes less than 2 in size with 50% legibility for the 3 letters of his name.03/21/2022: Pt. Is able to write his name with modA, however has difficulty with formulating letter sizes less than 2 in size with 50% legibility for the 3 letters of his name. Eval: Pt is able to hold a thin marker with his left hand, and formulate a line, and initiate a circular pattern (Pt. without glasses today) Goal status: Deferred  6. Pt. Will reach up  to comb/brush his hair with minA.  Baseline: 05/10/23: Pt. is able to use a pick independently with the left hand with occasional assist to the far side of the left his head. 03/08/23: Pt uses pick in L hand to reach 75% of his head; unable to reach R side of head; 02/13/2023:Pt. is able to use a hair pick with the left hand on the right side of his head, top, and back of the head. Pt. Continues to have  difficulty reaching further to the right side of his head with the left. 01/09/2023: Continue 12/28/2022: Pt. Is progressing, and is now able to use a hair pick with the left hand on the right side of his head, top, and back of the head. Pt. Continues to have difficulty reaching further to the right side of his head with the left. 11/16/2022: Pt. Is able to use a hair pick for the left side of his head using his left hand. Pt. Presents with difficulty reaching to the right side of his head.09/26/2022: Pt. Is able to use a hair pick for the left side of his head using his left hand. Pt. Presents with difficulty reaching to the right side of his head. 5/13/202: 75% using a hair pick, assist to the right side of the head. 07/20/22: reaches 75% of head, assist for far R side of head, fatigues quickly. 05/25/2022: Pt. Is able to reach up with the left hand to the left side, top, and back of his head. 03/23/2022: Pt. is now able to more consistently initiate reaching up to his head with his left hand in preparation for haircare12/18/2023: Pt. is now able to more consistently initiate reaching up to his head with his left hand in preparation for haircare. Eval: Pt. is able to initiate reaching up for hair care with a long handled brush, however is unable to sustain UE's in elevation to perform the task.     Goal status: Achieved  7. Pt. Will independently navigate the w/c through his environment with minA with visual scanning, and hand placement on the controls.  Baseline: 11/16/2022: Deferred 2/2 vision 09/26/2022: Pt. Is now navigating his w/c ouside the home, and around his block. 08/15/2022: Pt. Requires minA to setup hand on controls and MIN cues to navigate the w/c in wide spaces, requires MIN - MOD cues to navigate the w/c through more narrow doorways, and tighter turns - varies based on good vs bad vision days.07/20/22: Pt. Requires minA to setup hand on controls and MIN cues to navigate the w/c in wide spaces, requires  MIN - MOD cues to navigate the w/c through more narrow doorways, and tighter turns - varies based on good vs bad vision days. 05/25/2022: Pt. Requires minA  and  cues to navigate the w/c in wide spaces, and requires Mod cues to navigate the w/c through more narrow doorways, and tighter turns. Pt. Requires max cues for scanning through the environment, and moderate cues for hand placement on the controls.     Goal status: Deferred 2/2 vision    8. Pt. Will improve bilateral grip strength to be able to independently grasp, and pull up blankets, and linens.   Baseline: 05/10/23: Deferred 03/08/23: R grip 0-5 lbs, L grip 8 lbs; pt reports he can consistently pull up blankets and linens independently.  02/13/2023: Continue. Pt. presents with digit PIP, and DIP flexor tightness. 01/09/2023: Continue 12/28/2023: R: 0#, L: 5# Pt. Is now able to more consistently grasp and pull blankets up  over him. 11/16/2022: R: 0 L: 8# Pt. Is more consistently grasping his blankets and attempting to pull them up. 09/26/2022: R: 0  L: 8# Pt. is attempting to grasp, and pull blankets up more, using mostly the left hand. 08/15/2022: Pt. has difficulty securely holding, and pulling up blankets, and linens.    Goal status:Deferred  9. Pt. will consistently actively control the releasing of blankets, covers, and linens from his hands once they are in the desired position over him. Baseline:12/28/2022: Pt. Is consistently actively able to release linens once they are in the desired position over him. 11/16/2022: Pt. continues to improve with actively releasing blankets form his hands. 09/26/2022: Pt. is improving with actively releasing blankets form his hands. 08/15/2022: Pt. has difficulty with controlled releasing of blankets/linens from his grasp.     Goal status: Achieved    10. Pt. Will improve bilateral UE strength by 2 MM grades to assist with ADLs, and IADLs  Baseline: 08/27/23:  Right: shoulder flexion: 3+/5, abduction: 4-/5, elbow  flexion: 4+/5 , extension: 4-/5 wrist extension: 2/5; Left: shoulder flexion: 4+/5, abduction: 4+/5, elbow flexion: 5/5 , extension: 4/5  wrist extension: 3/5  08/07/23: Continue 05/31/2023: Continue 05/10/23: Right: shoulder flexion: 3+/5, abduction: 4-/5, elbow flexion: 4+/5 , extension: 4-/5 wrist extension: 2/5; Left: shoulder flexion: 4+/5, abduction: 4+/5, elbow flexion: 5/5 , extension: 4/5  wrist extension: 3/5 03/13/2023: Right: shoulder flexion: 3/5, abduction: 4/5, elbow flexion: 4+/5 , extension: 4-/5 wrist extension: 2/5; Left: shoulder flexion: 4+/5, abduction: 4+/5, elbow flexion: 5/5 , extension: 4/5  wrist extension: 3/5 03/08/23: NT this date d/t as pt arrived late; 02/13/2023: Continue 01/09/2023: Continue 12/28/2022: Right: shoulder flexion: 3/5, abduction: 4/5, elbow flexion: 4+/5 , extension: 4-/5 wrist extension: 2/5; Left: shoulder flexion: 4+/5, abduction: 4+/5, elbow flexion: 5/5 , extension: 4/5  wrist extension: 3/5 Right: shoulder flexion: 3+/5, abduction: 4/5, elbow flexion: 4+/5 , extension: 4+/5 wrist extension: 2/5; Left: shoulder flexion: 4+/5, abduction: 4+/5, elbow flexion: 5/5 , extension: 4+/5 wrist extension: 3-/5 11/16/2022: Right: shoulder flexion: 3/5, abduction: 4/5, elbow flexion: 4+/5 , extension: 4+/5 wrist extension: 2/5; Left: shoulder flexion: 4+/5, abduction: 4+/5, elbow flexion: 5/5 , extension:  wrist extension: 3-/5 Right: shoulder flexion: 3+/5, abduction: 4/5, elbow flexion: 4+/5 , extension: 4+/5 wrist extension: 2/5; Left: shoulder flexion: 4+/5, abduction: 4+/5, elbow flexion: 5/5 , extension: 4+/5 wrist extension: 3-/5 Goal status: Ongoing  11. Pt. will improve right wrist extension by 10 degrees to initiate reaching for items in preparation for ADLs. Baseline: 08/23/2023: R Wrist Ext: -31(-8)  08/07/23: Continue 05/31/23: Continue 05/10/23: Right wrist extension: -32(22) 03/13/23: -22(35) 03/08/23: NT this date as pt arrived late; 02/13/2023: Continue  01/09/2023: Continue 12/28/2022: -22(34) 11/16/2022: -30(10)    Goal status: Ongoing      12. Pt. Will require donn/doff a Tank top T-shirt efficiently with supervision and full set-up.    Baseline:08/23/23: Pt. Is able to independently doff his shirt, requiring assist to lean forward and loosen the back. 08/07/23: Continue 05/31/2023: Pt. Requires maxA donning a pullover sweatshirt, Mod-maxA doffing it. T-shirt tank TBD 05/10/23: Pt. Presents with difficulty completing, and requires increased time to complete    Goal status: Ongoing    13. Pt. Will independently perform bilateral/bimanual hand tasks needed to fold towels/linens/laundry Baseline:08/23/23: Pt. Continues to have difficulty using bilateral/bimanual hand tasks needed to fold towels/linens/laundry. 08/07/23: Continue 05/31/23: Continue 05/10/23:  Pt. Has difficulty folding towels/lines/laundry using bilateral hands Goal status:Ongoing  14. Pt. Will independently, and efficiently reach his right arm  over the side of the chair to pet his dog.    Baseline: 08/23/2023: Pt. Is now able to reach over the side of the armrest of his recliner chair to pet his dog.  08/07/23: Continue 05/31/2023: Continue 05/10/23: Pt. is unable to efficiently reach his right UE over the side of his recliner chair to pet his dog.    Goal status: Achieved    15. Pt. Will independently, and efficiently reach out in front of him to efficiently place items onto his table while sitting in his recliner.     Baseline:  08/23/2023: Pt. has difficulty consistently reaching out in front of him to place items on the on a tabletop surface with the right hand. 08/07/23: Continue 2/26/225: Continue 05/10/23: Pt. has difficulty reaching out in front of him far enough to efficiently place items on the table while sitting in a recliner chair.    Goal status: Ongoing   ASSESSMENT:  CLINICAL IMPRESSION:    Pt. reports that he hasn't had any popping in the elbow.  Pt. is responding well to  moist heat modality prior to the bilateral MP, PIP, and DIP stretches, and the right wrist stretches. Pt requires hand-over-hand assist to perform diagonal patterns of movement. Pt. tolerated slow, prolonged, gentle stretching. Pt. requires assist, and visual cues to reach the target to discard the discs from a lateral grasp. Pt. continues to be highly motivated, and Pt.'s caregivers continue to be very supportive to continue working towards improving BUE functioning, ROM, strength, motor control, [facility] skills, as well as visual compensatory strategies in order to maximize independence with daily ADLs, and IADL functioning.      PERFORMANCE DEFICITS in functional skills including ADLs, IADLs, coordination, dexterity, proprioception, ROM, strength, pain, FMC, GMC, decreased knowledge of use of DME, and UE functional use, cognitive skills including safety awareness, and psychosocial skills including coping strategies, environmental adaptation, habits, and routines and behaviors.   IMPAIRMENTS are limiting patient from ADLs, IADLs, leisure, and social participation.   COMORBIDITIES may have co-morbidities  that affects occupational performance. Patient will benefit from skilled OT to address above impairments and improve overall function.  MODIFICATION OR ASSISTANCE TO COMPLETE EVALUATION: Maximum or significant modification of tasks or assist is necessary to complete an evaluation.  OT OCCUPATIONAL PROFILE AND HISTORY: Comprehensive assessment: Review of records and extensive additional review of physical, cognitive, psychosocial history related to current functional performance.  CLINICAL DECISION MAKING: High - multiple treatment options, significant modification of task necessary  REHAB POTENTIAL: Fair    EVALUATION COMPLEXITY: High    PLAN: OT FREQUENCY: 2 x's a week  OT DURATION:12 weeks  PLANNED INTERVENTIONS: self care/ADL training, therapeutic exercise, therapeutic activity,  neuromuscular re-education, manual therapy, passive range of motion, patient/family education, and cognitive remediation/compensation RECOMMENDED OTHER SERVICES: PT  CONSULTED AND AGREED WITH PLAN OF CARE: Patient and family member/caregiver  PLAN FOR NEXT SESSION: Review HEP  Duey Ghent, MS, OTR/L  09/20/23

## 2023-09-22 DIAGNOSIS — G4733 Obstructive sleep apnea (adult) (pediatric): Secondary | ICD-10-CM | POA: Diagnosis not present

## 2023-09-23 DIAGNOSIS — G4733 Obstructive sleep apnea (adult) (pediatric): Secondary | ICD-10-CM | POA: Diagnosis not present

## 2023-09-25 ENCOUNTER — Ambulatory Visit: Payer: Medicare Other

## 2023-09-25 ENCOUNTER — Ambulatory Visit: Payer: Medicare Other | Admitting: Occupational Therapy

## 2023-09-25 DIAGNOSIS — R262 Difficulty in walking, not elsewhere classified: Secondary | ICD-10-CM

## 2023-09-25 DIAGNOSIS — R269 Unspecified abnormalities of gait and mobility: Secondary | ICD-10-CM | POA: Diagnosis not present

## 2023-09-25 DIAGNOSIS — I6930 Unspecified sequelae of cerebral infarction: Secondary | ICD-10-CM | POA: Diagnosis not present

## 2023-09-25 DIAGNOSIS — R2681 Unsteadiness on feet: Secondary | ICD-10-CM | POA: Diagnosis not present

## 2023-09-25 DIAGNOSIS — R278 Other lack of coordination: Secondary | ICD-10-CM

## 2023-09-25 DIAGNOSIS — M6281 Muscle weakness (generalized): Secondary | ICD-10-CM

## 2023-09-25 DIAGNOSIS — R2689 Other abnormalities of gait and mobility: Secondary | ICD-10-CM

## 2023-09-25 NOTE — Therapy (Signed)
 OUTPATIENT PHYSICAL THERAPY TREATMENT    Patient Name: Paul Hall MRN: [id number] DOB:[birth year], 28 y.o., male Today's Date: 09/26/2023  PCP:  [facility] PROVIDER:  Morene Sous, PA-C   PT End of Session - 09/26/23 0750     Visit Number [id number]    Number of Visits 184   date corrected from most recent recert   Date for PT Re-Evaluation 11/27/23    Authorization Type [employer] [place] [employer]    Authorization Time Period 01/04/21-03/29/21; Recert 03/24/2021-06/16/2021; Recert 09/15/2021- 12/08/2021; Recert 12/13/2021-03/07/2022    Progress Note Due on Visit 170    PT Start Time 1537    PT Stop Time 1614    PT Time Calculation (min) 37 min    Equipment Utilized During Treatment Gait belt    Activity Tolerance Patient tolerated treatment well    Behavior During Therapy WFL for tasks assessed/performed                            Past Medical History:  Diagnosis Date   Diabetes mellitus (HCC)    Hypertension    Stroke (HCC)    Past Surgical History:  Procedure Laterality Date   IVC FILTER PLACEMENT ([facility] HX)     LEG SURGERY     PEG TUBE PLACEMENT     TRACHEOSTOMY     Patient Active Problem List   Diagnosis Date Noted   Sepsis due to vancomycin  resistant Enterococcus species (HCC) 06/06/2019   SIRS (systemic inflammatory response syndrome) (HCC) 06/05/2019   Acute lower UTI 06/05/2019   VRE (vancomycin -resistant Enterococci) infection 06/05/2019   Anemia 06/05/2019   Skin ulcer of sacrum with necrosis of muscle (HCC)    Urinary retention    Type 2 diabetes mellitus without complication, with long-term current use of insulin  (HCC)    Tachycardia    Lower extremity edema    Acute metabolic encephalopathy    Obstructive sleep apnea    Morbid obesity with BMI of 60.0-69.9, adult (HCC)    Goals of care, counseling/discussion    Palliative care encounter    Sepsis (HCC) 04/27/2019   H/O insulin  dependent diabetes mellitus 04/27/2019   History of  CVA with residual deficit 04/27/2019   Seizure disorder (HCC) 04/27/2019   Decubitus ulcer of sacral region, stage 4 (HCC) 04/27/2019    REFERRING DIAG: Cerebral infarction, unspecified   THERAPY DIAG:  Muscle weakness (generalized)  Other lack of coordination  Difficulty in walking, not elsewhere classified  Abnormality of gait and mobility  Unsteadiness on feet  Other abnormalities of gait and mobility  Rationale for Evaluation and Treatment Rehabilitation  PERTINENT HISTORY: Ansel Ferrall is a 26yoM who presents with severe weakness, quadriparesis, altered sensorium, and visual impairment s/p critical illness and prolonged hospitalization. Pt hospitalized in October 2020 with ARDS 2/2 COVID19 infection. Pt sustained a complex and lengthy hospitalization which included tracheostomy, prolonged sedation, ECMO. In this period pt sustained CVA and SDH. Pt has now been liberated from tracheostomy and G-tube. Pt has since been hospitalized for wound infection and UTI. Pt lives with parents at home, has hospital bed and left chair, hoyer lift transfers, and power WC for mobility needs. Pt needs heavy physical assistance with ADL 2/2 BUE contractures and motor dysfunction   PRECAUTIONS: Fall  SUBJECTIVE:  Patient reports doing okay today without any major changes since last visit.    PAIN:  Are you having pain? No. But feeling tight.    TODAY'S  TREATMENT:  - 09/25/2023      *Patient was dependently transferred from power w/c to supine on mat and later back from mat to power w/c (using personal hoyer pad) - hoyer lift       TA:  -Patient performed anterior/posterior weight shift at EOM- 20 reps  -Thoracic dynamic trunk twist with arms abducted - Lead with elbows toward midline- 20 reps  -lateral trunk lean- Applying weight through his forearms x 20 reps  -Hip march x 15 reps alt LE -Hip abd/add x 15 reps alt LE -Seated LAQ x 15 reps alt LE -dynamic head mobility- looking  up/down - maintaining neutral sit balance- x 20 -dynamic head mobility- turning head- left to right - x 20 reps  (no LOB) yet VC to increase ROM as patient has tendency to look laterally without moving head. -UT mobilization- PT held shoulder down and patient performed 20 reps of lateral side bending     PATIENT EDUCATION: Education details: Exercise technique with VC   HOME EXERCISE PROGRAM:  Access Code: [email or web address] URL: [email or web address] Date: 03/23/2022 Prepared by: Reyes London  Exercises - Supine Bridge  - 3 x weekly - 3 sets - 10 reps - 2 hold - Supine Gluteal Sets  - 3 x weekly - 3 sets - 10 reps - 5 sec hold - Supine Quad Set  - 1 x daily - 3 x weekly - 3 sets - 10 reps - 5 hold - Seated Long Arc Quad  - 1 x daily - 7 x weekly - 3 sets - 10 reps - Seated Hip Adduction Squeeze with Ball  - 1 x daily - 3 x weekly - 3 sets - 10 reps - 5 hold - Seated Hip Abduction  - 1 x daily - 3 x weekly - 3 sets - 10 reps - 2 hold    PT Short Term Goals -       PT SHORT TERM GOAL #1   Title Pt will be independent with HEP in order to improve strength and balance in order to decrease fall risk and improve function at home and work.    Baseline 01/04/2021= No formal HEP in place; 12/12 no HEP in place; 05/10/2021-Patient and his father were able to report compliance with curent HEP consisting of mostly seated/reclined LE strengthening. Both verbalize no questions at this time.    Time 6    Period Weeks    Status Achieved    Target Date 02/15/21            PT LONG TERM GOAL #1     Title Patient will increase BLE gross strength by 1/2 muscle grade to improve functional strength for improved independence with potential gait, increased standing tolerance and increased ADL ability.     Baseline 01/04/2021- Patient presents with 1/5 to 3-/5 B LE strength with MMT; 12/12: goal partially met for Left knee/hip; 05/10/2021= 2-/5 bilateal Hip flex; 3+/5 bilateral Knee ext;  06/21/2021= Patient presents with 2-/5 bilateral Hip flex; 3+/5 bilateral knee ext/flex; 2-/5 left ankle DF; 0/5 right ankle- and able to increase reps and resistance with LE's. 09/15/2021- Patient technically presents with 2-/5 B hip flex/abd/add - but he is able to raise his hip up to approx 100 deg which has improved. 3+/5 Bilateral knee ext, 2-/5 left ankle and 0/5 right ankle.  12/08/2021= Patient able to lift left knee at 110 deg of hip flex; presents with 3+/5 knee ext, 2-/5 left ankle DF and 0/5 right ankle DF,  2-/5 bilateral Hip abd in seated position.    12/6: R: knee 3+/5 ext, 2/5 flexion, left knee 3+/5 extension, 3+/5 flexion, R hip: 2+/5 hip add, 2+/5 hip ABD L hip: 4-/5 hip ABD, 3+/5 hip ADD, 3+/5 hip flexion; 06/06/2022= Patient now presents with 2-/5 right ankle DF/PF;     Time 12     Period Weeks     Status MET    Target Date 03/07/2022         PT LONG TERM GOAL #2    Title Patient will tolerate sitting unsupported demonstrating erect sitting posture for 15 minutes with CGA to demonstrate improved back extensor strength and improved sitting tolerance.     Baseline 01/04/2021- Patient confied to sitting in lift chair or electric power chair with back support and unable to sit upright without physical assistance; 12/12: tolerates <1 minutes upright unsupported sitting. 05/10/2021=static sit with forward trunk lean  in his power wheelchair without back support x approx 3 min. 06/21/2021=Unable to assess today due to patient with acute back pain but on previous visit able to sit x 8 min without back support. 09/15/2021- on last visit- 09/13/2021- patient was able to sit unsupported x 8 min at edge of mat. 10/13/2023 - Patient was able to sit at edge of mat with varying level of assist today from SBA to min A for a total of 20 min. 12/13/2021= Patient demonstrated unsupported sitting at edge of mat for approx 20 min    Time 12     Period Weeks     Status GOAL MET    Target Date 12/08/21          PT  LONG TERM GOAL #3    Title Patient will demonstrate ability to perform static standing in // bars > 2 min with Max Assist  without loss of balance and fair posture for improved overall strength for pre-gait and transfer activities.     Baseline 01/04/2021= Patient current uanble to stand- Dependent on hoyer or sit to stand lift for transfers. 05/10/2021=Not appropriate yet- Currently still dependent with all transfers using hoyer. 06/21/2021= Patient continuing now to focus on LE strengthening to prepare for standing-unable to try today due to acute low back pain-  planning on attempting in new cert period. 09/15/2021- Patient has attempted standing 2x in past two week- max Assist of 2 people - only once was he successful to clearing his bottom from chair - Will continue to be a focus during the new certification. 12/13/2021= Patient has been limited secondary to increased overall low back pain during this certification and will require more time to focus on this goal.  12/6: not assessed this date, will assess at date when 2-3 PTs are present for assistance     Time 12     Period Weeks     Status GOAL not appropriate at this time - may attempt in future once Patient presents with improved overall LE strength.                 PT LONG TERM GOAL #4    Title Pt will improve FOTO score by 10 points or more demonstrating improved perceived functional ability     Baseline FOTO 7 on 10/17; 03/15/21: FOTO 12; 05/10/2021 06/21/2021= 1; 09/15/2021= 9; 12/13/2021= Will issue next visit 12/6: 4; 06/06/2022= Will assess next visit; Goal not appropriate as patient is not ambulatory.     Time 12     Period Weeks  Status WITHDRAWN    Target date 11/30/2022         PT LONG TERM GOAL #5    Title Patient will perform sit to stand transfer with appropriate AD and max assist of 2 people with 75% consistency to prepare for pregait activities.     Baseline 09/15/2021= Patient unable to stand well- unable to clear his bottom off  chair with Max assist of 2 persons. 12/13/2021- Goal not appropriate to try yet but will keep and roll over to next cert as shift continues to focus on transfers/standing; 4/24= Patient able to perform active ankle DF/PF with right LE and able to raise his knee into seated march and clear floor without physical assist today - as previously unable as well as lift right knee ext to near full ROM to improve strength for eventual standing. 09/07/2022- Goal continues to not be appropriate but patient is now standing some and will keep goal active; 11/23/2022= patient is now performing sit to stand with max A +2 and now able to clear his bottom off mat. 11/30/2022- Patient continues to perform sit to stand transfers with Max A+2 and able to clear bottom well off mat but not erect yet. 05/31/2023- Patient is able to stand consistently with max A +2 - unable to achieve full knee or trunk ext yet is able to clear bottom off mat for improved LE weightbearing. 09/04/2023- Patient performed several Sit to stand requiring max A+2  with improving posture- able to ext R knee better - near terminal knee and able to clear bottom off mat- still not full but able to contract glutes for standing as erect as possible.     Time 12     Period Weeks     Status PROGRESSING    Target date  11/27/2023       PT LONG TERM GOAL #6  Title Patient will tolerate sitting unsupported demonstrating erect sitting posture for 30 minutes with CGA to demonstrate improved back extensor strength and improved sitting tolerance.   Baseline 12/13/2021= Patient demonstrated unsupported sitting at edge of mat for approx 20 min;  12/29/2021- Patient performed approx 30 min of dynamic sitting activities today. 06/06/2022= Patient demonstrated ability to sit and perform static and dynamic UE/LE movement with only Supervision.   Time 12   Period Weeks   Status GOAL MET           7.  Patient will tolerate 2 minutes or more of standing in sit to stand lift or with  max assist +2 in order to indicate improved lower extremity weightbearing tolerance for progression to standing in parallel bars. Baseline: 1 minute on most recent stand 02/21/22; 06/06/2022- Patient did attempt today after complaining of right LE pain last week. He attempted 3 stands using sit to stand lift. - all over 1 min- last one approx 48 sec- stopped due to fatigue. More erect standing in lift today - still poor gluteal strength but able to activate glutes and extend hips upon command but unable to hold > 5 sec. 07/28/2022- Will attempt standing next visit but patient has been able to stand since last progress note for up to 3 min at a time at his best. 09/07/2022-  attempted standing with max +2 and patient able to stand 15-20 sec so will now  revise goal; 11/23/2022- Patient at his best between last 2 visits was able to stand approx 50 sec with max A+2.  11/30/2022- Patient performed 4 trials of static  standing - max A +2- clearing bottom and using forearms to bear weight  03/15/2023: Patient performed 2 trials of static standing - max A +2- clearing edge of mat and using forearms to bear weight for approximately 45 seconds, is able to come into approximately 45 degrees of knee flexion from sit to stand; 05/31/2023- Patient has not attempted standing in 1 month but able to perform x 4 trials with max A +2 (PT and his mom) and able to clear his bottom off mat and attempted to activate his glutes and apply some forearm pressure - able to stand between 25-40 sec today. 09/04/2023- Patient performed 3 trials of standing ranging from 17 -47 sec - improving right knee terminal knee ext and more activation upon command with gluteals- able to clear mat table. Continued to require Max A+2 and blocking knees with forearm/trunk support.   Goal status: PROGRESSING Target date: 11/27/2023   8. Patient will tolerate sitting unsupported demonstrating ability to perform dynamic UE/LE activities for 30 minutes   independently to demonstrate ability to sit at edge of bed to eat or perform some ADL's/exercise for optimal quality of life.             Baseline: 06/06/2022- Patient able to sit and perform some UE/LE exercises but requires CGA at times for safety- he is able to static sit for 30 min with supervision. 07/27/2022= Patient able to now sit > 30 min with Supervision only - performing static and dynamic activities. Will keep goal active to ensure patient is consistent. 09/07/2022=  Patient able to demonstrate consistent ability to sit at edge of mat during visits             Goal Status: MET             Target date: 08/29/2022  9.  Patient will perform rolling left to right with Mod assist for improved independence with bed mobility to assist with dressing and positioning in bed. Baseline: 09/04/2023- Mother reported last visit that he was having some difficulty with assisting with bed mobility. Goal status: NEW Target date: 11/27/2023    10. Patient will perform active bridging -able to clear bottom   Off mat for 10 reps with PT/caregiver holding his feet    For improved trunk ext strength with standing and to    To assist with be mobility/positioning.  Baseline: 09/04/2023= (PT assisted- holding right LE In neutral and pressure on each foot) - Difficulty clearing bottom off mat. Goal status: NEW Target date: 11/27/2023           Plan     Clinical Impression Statement  Treatment focused on sitting dynamic activities including trunk control in all directions along with dynamic head mobility as patient does not use his much head mobility when sitting- tendency to move eyes without much head mobility- did improve with practice overall today and demonstrated improved weight bearing through bil forearms.   Pt will continue to benefit from skilled physical therapy intervention to address impairments, improve QOL, and attain therapy goals.    Personal Factors and Comorbidities Comorbidity 3+;Time since onset of  injury/illness/exacerbation    Comorbidities CVA, diabetes, Seizures    Examination-Activity Limitations Bathing;Bed Mobility;Bend;Caring for Others;Carry;Dressing;Hygiene/Grooming;Lift;Locomotion Level;Reach Overhead;Self Feeding;Sit;Squat;Stairs;Stand;Transfers;Toileting    Examination-Participation Restrictions Cleaning;Community Activity;Driving;Laundry;Medication Management;Meal Prep;Occupation;Personal Finances;Shop;Yard Work;Volunteer    Stability/Clinical Decision Making Evolving/Moderate complexity    Rehab Potential Fair    PT Frequency 1-2x / week    PT Duration 12 weeks    PT  Treatment/Interventions ADLs/Self Care Home Management;Cryotherapy;Electrical Stimulation;Moist Heat;Ultrasound;DME Instruction;Gait training;Stair training;Functional mobility training;Therapeutic exercise;Balance training;Patient/family education;Orthotic Fit/Training;Neuromuscular re-education;Wheelchair mobility training;Manual techniques;Passive range of motion;Dry needling;Energy conservation;Taping;Visual/perceptual remediation/compensation;Joint Manipulations    PT Next Visit Plan core strength/motor control in short-sitting, sit to stand if able; Continue with progressive LE Strengthening. Stand activities as appropriate.    PT Home Exercise Plan No changes to HEP today    Consulted and Agree with Plan of Care Patient;Family member/caregiver    Family Member Consulted             03/09/2023, 8:04 AM      8:07 AM, 09/26/23 Reyes LOISE London PT Physical Therapist - [facility]  8:07 AM 09/26/23

## 2023-09-25 NOTE — Therapy (Addendum)
 . Occupational Therapy Neuro Treatment Note  Patient Name: Paul Hall MRN: [id number] DOB:03-15-1996, 28 y.o., male Today's Date: 09/25/2023  PCP: Dr. Bertell REFERRING PROVIDER: Dr. Bertell    OT End of Session - 09/25/23 1713     Visit Number [id number]    Number of Visits 156    Date for OT Re-Evaluation 10/30/23    Authorization Type 09/26/2022    OT Start Time 1620    OT Stop Time 1700    OT Time Calculation (min) 40 min    Activity Tolerance Patient tolerated treatment well    Behavior During Therapy WFL for tasks assessed/performed                      Past Medical History:  Diagnosis Date   Diabetes mellitus (HCC)    Hypertension    Stroke [facility])    Past Surgical History:  Procedure Laterality Date   IVC FILTER PLACEMENT ([facility] HX)     LEG SURGERY     PEG TUBE PLACEMENT     TRACHEOSTOMY     Patient Active Problem List   Diagnosis Date Noted   Sepsis due to vancomycin  resistant Enterococcus species (HCC) 06/06/2019   SIRS (systemic inflammatory response syndrome) (HCC) 06/05/2019   Acute lower UTI 06/05/2019   VRE (vancomycin -resistant Enterococci) infection 06/05/2019   Anemia 06/05/2019   Skin ulcer of sacrum with necrosis of muscle (HCC)    Urinary retention    Type 2 diabetes mellitus without complication, with long-term current use of insulin  (HCC)    Tachycardia    Lower extremity edema    Acute metabolic encephalopathy    Obstructive sleep apnea    Morbid obesity with BMI of 60.0-69.9, adult (HCC)    Goals of care, counseling/discussion    Palliative care encounter    Sepsis (HCC) 04/27/2019   H/O insulin  dependent diabetes mellitus 04/27/2019   History of CVA with residual deficit 04/27/2019   Seizure disorder (HCC) 04/27/2019   Decubitus ulcer of sacral region, stage 4 (HCC) 04/27/2019   ONSET DATE: 01/2019  REFERRING DIAG: CVA/COVID-19  THERAPY DIAG:  Muscle weakness (generalized)  Other lack of coordination  Rationale for  Evaluation and Treatment Rehabilitation  SUBJECTIVE:   SUBJECTIVE STATEMENT:  Pt. Reports that he uses a cooking utensil to crush cookies for their Father's Day dessert.  Pt accompanied by: self and family member  PERTINENT HISTORY:  Pt. is a 28 y.o. male who was diagnosed with COVID-19, and CVA with resultant quadriplegia. Pt. Was then hospitalized with VRE UTI. PMHx includes: urinary retention, seizure disorder, obstructive sleep disorder, DM Type II, Morbid obesity.   PRECAUTIONS: None  WEIGHT BEARING RESTRICTIONS No  PAIN:  No reports of pain Are you having pain? no  FALLS: Has patient fallen in last 6 months? No  LIVING ENVIRONMENT: Lives with: lives with their family Lives in: House/apartment Stairs: No Level Entry Has following equipment at home: Wheelchair (power) and hoyer lift, sit to stand lift  PLOF: Independent  PATIENT GOALS: To be able to engage in more daily care tasks.  OBJECTIVE:   HAND DOMINANCE: Right  ADLs:  Transfers/ambulation related to ADLs: Eating: Pt. reports being able to hold standard utensils, and is starting to engage more in self-feeding tasks, hand to mouth patterns. Pt. Reports that he does as much as he can with the task, and family assists  with the remainder of the task. Grooming: Pt. Is able to initiate holding an electric toothbrush,  and brush his teeth. Family assists LB Dressing: Total Assist UE dressing: Pt. is now able to reach up to actively assist with grasping , and pulling his gown down. Toileting:  Total Assist Bathing: MaxA UB, Total assist LB Tub Shower transfers: N/A Equipment: See above    IADLs: Shopping: Relies on family to assist Light housekeeping: Total Assist Meal Prep: Total Assist Community mobility:   Medication management:  Total Assist  Financial management: N/A Handwriting: Not legible: Pt. Is able to hold a pen with the left hand, and initiate marking the page. Pt.'s eye glasses were not  available  MOBILITY STATUS:  Power w/c  POSTURE COMMENTS:  Pt. Requires position changes in his power w/c  ACTIVITY TOLERANCE: Activity tolerance:  Fair  FUNCTIONAL OUTCOME MEASURES: FOTO: 40 TR score: 45  05/25/2022:   FOTO: 50 TR score: 45  07/20/22: FOTO 55  03/08/23: 51  UPPER EXTREMITY ROM     Active ROM Left eval Left  03/21/2022 Left 05/25/2022 L  07/20/22 Left 07/25/2022 Left  08/15/2022 Left  09/26/2022 Left  11/16/2022 Left 12/28/2022 Left 03/13/23 Left 05/10/23  Shoulder flexion 119 123 125 130  136 138 130 142 144 144  Shoulder abduction 110 115 115 115  118 121 125 142 140 140  Shoulder adduction             Shoulder extension             Shoulder internal rotation             Shoulder external rotation             Elbow flexion 135(135) 145 145 145  145    145 145  Elbow extension -27(-18) -21(-20) -20(-16)  -25(-10) -23(-10) -21(-10) -20(-18) -20(-18) -20(-17) -17(-16)  Wrist flexion             Wrist extension 10(50) 19(50) 28(50)  30(50) 35(55) 36(55) 36(60) 40(60) 45(60) 45(60)  Wrist ulnar deviation             Wrist radial deviation             Wrist pronation             Wrist supination   Limited by flexor tone       10(15)   (Blank rows = not tested)    Active ROM Right eval Right 03/21/2022 Right 05/25/2022 R  07/20/22 Right  07/25/22 Right 08/15/2022 Right 09/26/2022 Right 11/16/2022 Right 12/28/2022 Right 03/13/23 Right 05/10/23  Shoulder flexion 106 scaption 105  Scaption 62 flexion 110 scaption 100  105 105 105 112 Scaption 123 Scaption 122  Scaption  Shoulder abduction 114 90 97 100  105 117 120 124 124 122  Shoulder adduction             Shoulder extension             Shoulder internal rotation             Shoulder external rotation             Elbow flexion 120(130) 145 145 140  144 140 142 148 148 145  Elbow extension -45(-35) -22(-35) -28(-22)  -32(-21) -32(-28) -28(-26) -28(-28) -27(-26) -27(-40) -38(-35)  Wrist flexion              Wrist extension -30(10) -25(10) -10(30)  -10(20) -10(25) -20(40) -30(10) -22(34) -22(35) -32(22)  Wrist ulnar deviation             Wrist radial  deviation             Wrist pronation             Wrist supination   Limited by flexor tone            12/28/2022: Thumb radial abduction: Right: 12(46), Left: 40(54)  Digits: Bilateral 2nd through 5th digit: MP Hyperextension with PIP, an DIP flexion     UPPER EXTREMITY MMT:     Left eval Left 03/21/2022 Left  05/25/2022 L 07/20/22 Left  08/15/2022 Left 09/26/2022 Left 11/16/2022 Left 12/28/2022 Left  03/13/2023 Left  05/10/23  Shoulder flexion 3-/5 3/5 3+/5 4+/5 4+/5 4+/5 4+/5 4+/5 4+/5 4+/5  Shoulder abduction 3-/5 3-/5 4-/5 4+/5 4+/5 4+5 4+/5 4+/5 4+/5 4+/5  Shoulder adduction            Shoulder extension            Shoulder internal rotation            Shoulder external rotation            Elbow flexion 3/5 3+/5 4-/5 4+/5 5/5 5/5 5/5 5/5 5/5 5/5  Elbow extension 2-/5 2/5  4+/5 4+/5 4+/5  4/5 4/5 4/5 through available range  Wrist flexion            Wrist extension 2/5 2+/5 3-/5  3-/5 3-/5 3-/5 3/5 3/5 3/5  Wrist ulnar deviation            Wrist radial deviation            Wrist pronation            Wrist supination            (Blank rows = not tested)  Right eval Right 03/21/2022 Right  05/25/2022 R 07/20/22 Right 08/15/2022 Right 09/26/2022 Right 11/16/2022 Right 12/28/2022 Right 03/13/2023 Right 05/10/23  Shoulder flexion 3-/5 scaption 3-/5 3-/5 3+/5 3+/5 3+/5 3/5 3/5 3/5 3+/5  Shoulder abduction 3-/5 3-/5 3-/5 4/5 4/5 4/5 4/5 4/5 4/5 4/-5  Shoulder adduction            Shoulder extension            Shoulder internal rotation            Shoulder external rotation            Elbow flexion 3/5 3/5 3+/5 4/5 4+/5 4+/5 4+/5 4+/5 4+/5 4+/5  Elbow extension 2-/5 2/5 3-/5 4+/5 4+/5 4+/5 4+/5 4-/5 4-/5 4-/5  Wrist flexion            Wrist extension 2-/5 2-/5 2/5 2/5 2/5 2/5 2/5 2/5 2/5 2/5  Wrist ulnar deviation             Wrist radial deviation            Wrist pronation            Wrist supination              HAND FUNCTION:  Grip strength: Right: 0 lbs; Left: 0 lbs and Lateral pinch: Right: 5 lbs, Left: 2 lbs  03/21/2022: Lateral pinch: Right: 3.5 lbs, Left: 2 lbs  07/20/22: Grip strength: Right: 3 lbs; Left: 4 lbs and Lateral pinch: Right: 5 lbs, Left: 3 lbs  08/15/2022:  Grip strength: Right: 0 lbs; Left: 5 lbs and Lateral pinch: Right: 2 lbs, Left: 4 lbs  09/26/2022  Grip strength: Right: 0 lbs; Left: 8 lbs and Lateral pinch: Right: 2 lbs, Left: 5 lbs  11/16/2022  Grip strength: Right: 0 lbs; Left: 8 lbs  9/25/20204  Grip strength: Right: 0 lbs; Left: 8 lbs  Grip strength: Right: 0 lbs; Left: 8 lbs   03/08/23:  Grip strength: Right: 0 lbs without PROM, 5 lbs after PROM, Left 8 lbs  05/10/23:  Grip strength: TBD  Bilateral digit PIP/DIP flexion contractures with MP hyperextension with attempts for AROM. Pt. is able to tolerate AROM to the bilateral digits at the initial evaluation however, has a history of pain in the digits.  COORDINATION: Eval: Pt. is unable to grasp 9-hole test pegs. Pt. is able to initiate grasping larger pegs, and is able to hold a pen in the left hand.  07/20/22: 2 min 36 seconds to remove 9 pegs from 9 hole peg test - cues to locate pegs 2/2 low vision. Pt. is able to initiate grasping larger pegs on R hand and is able to hold a pen in the left hand.  08/15/2022:    Vision, and sensation limiting accuracy of 9 hole peg test results. Pt. Was able to grasp and remove vertical pegs intermittently with cues.    05/10/23: TBD  SENSATION: Light touch: Impaired   EDEMA:  N/A  MUSCLE TONE: BUE flexor Spasticity  COGNITION Overall cognitive status: Continue to assess in functional context  VISION:   Subjective report: Pt. was not wearing glasses at the time of the initial eval.  Baseline vision: Vision is very limited. Wears glasses all the time Visual  history: History of impaired vision following CVA. Pt. Has received treatment through the [facility] low vision rehabilitation program.   VISION ASSESSMENT: Impaired To be further assessed in functional context  PERCEPTION: Impaired   PRAXIS: Impaired: motor planning  OBSERVATIONS:  Pt reports being on Tramadol   TODAY'S TREATMENT  09/25/23   Therapeutic Ex.:  -BUE strengthening for elbow extension through multiple planes using then Matrix Tower with 2.5# on the right,  and 2.5# increasing to 7.5# on the left.  Therapeutic Activities:  -Pt. worked on bilateral UE functional reaching in preparation for reaching out to grasp the bilateral parallel bars.  -Pt. Worked on grasping the Saebo rings with the right hand, and reaching out to release, and place the them onto the 1st horizontal rung      PATIENT EDUCATION: Education details: stretching, PROM, massage to the digits on the bilateral hands. Person educated: Patient and Parent Education method: Explanation Education comprehension: verbalized understanding  HOME EXERCISE PROGRAM:  Continue ongoing assessment, and continue to provide as needed.   GOALS: Goals reviewed with patient? Yes  SHORT TERM GOALS: Target date: 09/18/2023     To assess splint fit, and make appropriate adjustments to promote good skin integrity through the palmar surface of the bilateral hands.  Baseline: 05/25/22: Goal currently met, however ongoing as needs to assess splint fit arise. 03/23/2022: Pt. is wearing splints a couple of hours at night bilateral resting hand splints. 03/21/2022: Pt. is wearing splints a couple of hours at night bilateral resting hand splints. Goal status: Deferred   LONG TERM GOALS: Target date:10/30/2023      FOTO score will Improve by 2 points for Pt. perceived improvement with the assessment specific ADL/IADL tasks.  Baseline:  03/08/23: 51; 12/28/2022: FOTO score: 56; 11/16/2022: 52 09/27/2022: 51 07/20/2022: FOTO 55 05/25/2022:  FOTO score: 50,  TR score: 45 Eval: FOTO score: 40,  TR score: 45 Goal status: Achieved   2.   Pt. will independently perform oral care for 100%  of the task after complete set-up. Baseline: 05/10/23: Pt. Performs 90% of the task. Pt.'s mother assists with 10% of the task for thorough cleaning the hard to reach places way in the back of the oral cavity.03/08/23: not as much assist required proximally, but to adjust positioning of toothbrush; 02/13/23: Pt. Continues to require assist proximally 01/09/2023: Continue 12/28/2022:  Pt. Continues to complete 90% of the task with complete set-up, with visual cues, and occassional assist of the right elbow. 11/16/2022: Pt. Completes 90% of the task with complete set-up. 09/26/2022: Completes 90% of the task, proximal assist at times required at the elbow.  08/15/2022: Completes 90% of the task, proximal assist at times required at the elbow. 07/20/22: completes 90% of task, limited by shoulder flexion. 05/25/2022:  Pt. Is able to initiate and perform oral care for approximately 90% of the task. Complete set-up required. Assi needed only for the very back teeth. 03/23/2022: Pt. Is able to initiate and perform oral care for approximately 75% of the task. Pt. Requires assist at proximally at the elbow for through oral care. 03/21/2022: Pt. Is able to initiate and perform oral care for approximately 75% of the task. Pt. Requires assist at proximally at the elbow for through oral care. Eval: Pt. is able to initiate using an electric toothbrush. Pt. requires assist for set-up, and assist for thoroughness, and as he Pt. fatigues. Goal status:  Partially met, Goal discontinued  3.  Pt. Will be modified independence with self-feeding for 100% using a swivel spoon, and standard fork after complete set-up Baseline: 05/10/2023: Pt. feeds self 95-100% of the meal. with occasional assist to reposition the utensil. 03/08/23: indep with cup with a handle and lid, assist to reposition eating  utensils in hand;  02/13/23:  Pt. Is now able to consistently, and efficiently use a swivel spoon for eating cereal with the left hand.01/09/2023: Continue 12/28/2022: Pt. Is now feeding himself 90% of the time using a swivel spoon with the left hand, and 95% of the time with the fork. Pt. Continues to have difficulty with cutting food. 11/16/2022: 100% with finger foods, Pt. Is initiating with a swivel spoon, and universal cuff for fork use, however requires assist for consistency, and accuracy.  09/26/2022: 90% using the spoon, assist at time with the forearm motion, and grip on the utensils. 100% for finger foods.  08/15/2022:  90% using the spoon, assit at time with the forearm motion, and grip on the utensils. 100% for finger foods-independent with set-up- unsupervised.  07/20/22: self-feeds cereal using spoon 90% of task. 05/25/2022: Pt. Is able to use a spoon to scoop cereal when feeding himself cereal 85% of the time. Pt. Is able to feed himself snack/finger foods 100% of the time. Pt. Continues to work on consistency of  stabilizing a cup/mug when drinking. Pt. Is able to grasp a water  bottle with assist initially, with assist tapering off as he drinks.03/23/2022: Pt. Is able to perform scooping cereal for 75% of the time. Pt. required assist, and support at the left elbow, and Pt. Presents with limited forearm supination when using the spoon, and bringing it towards his mouth. Pt. Is able to use a fork to spear items, and perform the hand to mouth pattern.  03/21/2022: Pt. Is able to perform scooping cereal for 75% of the time. Pt. required assist, and support at the left elbow, and Pt. presents with limited forearm supination when using the spoon, and bringing it towards his mouth. Pt. Is able to  use a fork to spear items, and perform the hand to mouth pattern.  Eval: Pt. is able to hold standard standard utensils. Pt. Performs as much of the task as he, can and has assistance for the remainder. Goal status:   Improved, Achieved  4.  Pt. will improve grasp patterns and consistently grasp 1/4 objects for ADL, and IADL tasks.  Baseline: 08/27/23: Pt. is able to grasp flat discs, and sustain a secure hold on them while moving the UE through various planes. 08/07/23: Continue 05/31/2023: Continue 05/10/23: Pt. is consistently able to grasp 1/2 objects with visual cues. Pt. Requires assist, and increased visual cues to grasp 1/4 objects on an elevated plane.  Pt. Is unable to grasp the flat objects at the tabletop surface. Pt. Is grasping grasping small puppy vitamins.03/08/23: 1/2 objects efficiently; 02/13/2023: 1/2 objects efficiently 01/09/2023: Continue 12/28/2022: Continues to grasp 1/4 pegs with min a + visual cues, consistently grasping 1/2 objects with visual cues 11/16/2022: Continues to grasp 1/4 pegs with min a + visual cues, consistently grasping 1/2 objects with visual cues 09/26/2022: Continues to grasp 1/4 pegs with min a + visual cues, consistently grasping 1/2 objects with visual cues. 08/15/2022: grasps 1/4 pegs with min a + visual cues, consistently grasping 1/2 objects with visual cues. 07/20/22: grasps 1/4 pegs with min a + visual cues, consistently grasping 1/2 objects with visual cues. 05/25/2022: Pt. Is working on improving consistency of grasping 1/2 objects with visual cues.  03/23/2022: Pt. Is able to grasp 1 objects consistently,and continues to work on the hand patterns needed to grasp 1/2 objects.03/21/2022: Pt. Is able to grasp 1 objects consistently,and continues to work on the hand patterns needed to grasp 1/2 objects. Eval: Pt. is able to grasp 1 objects intermittently using a lateral grasp pattern. Goal status: Ongoing  5.  Pt. will independently write his name legibly with letter sizes under 1. Baseline: 05/10/23: Deferred 03/08/23: Not addressed this period; 02/13/2023: Continue 01/09/2023: Continue. 12/28/2022: Pt. is now using an IPad Pen. 11/16/2022: Pt. Continues to be  able to write name with smaller letter size. Pt. Is signing name with a computer styus. Pt. Requires visual cues, and assist 09/26/2022: Pt. Continues to be able to write name with smaller letter size. Pt. Is signing name with a computer styus. Pt. Requires visual cues, and assist. 08/15/2022: Pt. Is able to write name with smaller letter size. Pt. Is signing name with a computer styus. Pt. Requires visual cues, and assist. 07/20/22: stabilizing assist to write name with 75% legibility with 2 letters. 05/25/2022: Pt. to continue to work towards formulating grasp patterns in preparation for grasping a large width pen. Pt. Requires visual cues. 03/23/2022: Pt. Is able to write his name with modA, however has difficulty with formulating letter sizes less than 2 in size with 50% legibility for the 3 letters of his name.03/21/2022: Pt. Is able to write his name with modA, however has difficulty with formulating letter sizes less than 2 in size with 50% legibility for the 3 letters of his name. Eval: Pt is able to hold a thin marker with his left hand, and formulate a line, and initiate a circular pattern (Pt. without glasses today) Goal status: Deferred  6. Pt. Will reach up to comb/brush his hair with minA.  Baseline: 05/10/23: Pt. is able to use a pick independently with the left hand with occasional assist to the far side of the left his head. 03/08/23: Pt uses pick in L hand to reach  75% of his head; unable to reach R side of head; 02/13/2023:Pt. is able to use a hair pick with the left hand on the right side of his head, top, and back of the head. Pt. Continues to have difficulty reaching further to the right side of his head with the left. 01/09/2023: Continue 12/28/2022: Pt. Is progressing, and is now able to use a hair pick with the left hand on the right side of his head, top, and back of the head. Pt. Continues to have difficulty reaching further to the right side of his head with the left. 11/16/2022: Pt. Is  able to use a hair pick for the left side of his head using his left hand. Pt. Presents with difficulty reaching to the right side of his head.09/26/2022: Pt. Is able to use a hair pick for the left side of his head using his left hand. Pt. Presents with difficulty reaching to the right side of his head. 5/13/202: 75% using a hair pick, assist to the right side of the head. 07/20/22: reaches 75% of head, assist for far R side of head, fatigues quickly. 05/25/2022: Pt. Is able to reach up with the left hand to the left side, top, and back of his head. 03/23/2022: Pt. is now able to more consistently initiate reaching up to his head with his left hand in preparation for haircare12/18/2023: Pt. is now able to more consistently initiate reaching up to his head with his left hand in preparation for haircare. Eval: Pt. is able to initiate reaching up for hair care with a long handled brush, however is unable to sustain UE's in elevation to perform the task.     Goal status: Achieved  7. Pt. Will independently navigate the w/c through his environment with minA with visual scanning, and hand placement on the controls.  Baseline: 11/16/2022: Deferred 2/2 vision 09/26/2022: Pt. Is now navigating his w/c ouside the home, and around his block. 08/15/2022: Pt. Requires minA to setup hand on controls and MIN cues to navigate the w/c in wide spaces, requires MIN - MOD cues to navigate the w/c through more narrow doorways, and tighter turns - varies based on good vs bad vision days.07/20/22: Pt. Requires minA to setup hand on controls and MIN cues to navigate the w/c in wide spaces, requires MIN - MOD cues to navigate the w/c through more narrow doorways, and tighter turns - varies based on good vs bad vision days. 05/25/2022: Pt. Requires minA  and  cues to navigate the w/c in wide spaces, and requires Mod cues to navigate the w/c through more narrow doorways, and tighter turns. Pt. Requires max cues for scanning through the  environment, and moderate cues for hand placement on the controls.     Goal status: Deferred 2/2 vision    8. Pt. Will improve bilateral grip strength to be able to independently grasp, and pull up blankets, and linens.   Baseline: 05/10/23: Deferred 03/08/23: R grip 0-5 lbs, L grip 8 lbs; pt reports he can consistently pull up blankets and linens independently.  02/13/2023: Continue. Pt. presents with digit PIP, and DIP flexor tightness. 01/09/2023: Continue 12/28/2023: R: 0#, L: 5# Pt. Is now able to more consistently grasp and pull blankets up over him. 11/16/2022: R: 0 L: 8# Pt. Is more consistently grasping his blankets and attempting to pull them up. 09/26/2022: R: 0  L: 8# Pt. is attempting to grasp, and pull blankets up more, using mostly the left hand. 08/15/2022:  Pt. has difficulty securely holding, and pulling up blankets, and linens.    Goal status:Deferred  9. Pt. will consistently actively control the releasing of blankets, covers, and linens from his hands once they are in the desired position over him. Baseline:12/28/2022: Pt. Is consistently actively able to release linens once they are in the desired position over him. 11/16/2022: Pt. continues to improve with actively releasing blankets form his hands. 09/26/2022: Pt. is improving with actively releasing blankets form his hands. 08/15/2022: Pt. has difficulty with controlled releasing of blankets/linens from his grasp.     Goal status: Achieved    10. Pt. Will improve bilateral UE strength by 2 MM grades to assist with ADLs, and IADLs  Baseline: 08/27/23:  Right: shoulder flexion: 3+/5, abduction: 4-/5, elbow flexion: 4+/5 , extension: 4-/5 wrist extension: 2/5; Left: shoulder flexion: 4+/5, abduction: 4+/5, elbow flexion: 5/5 , extension: 4/5  wrist extension: 3/5  08/07/23: Continue 05/31/2023: Continue 05/10/23: Right: shoulder flexion: 3+/5, abduction: 4-/5, elbow flexion: 4+/5 , extension: 4-/5 wrist extension: 2/5; Left: shoulder flexion:  4+/5, abduction: 4+/5, elbow flexion: 5/5 , extension: 4/5  wrist extension: 3/5 03/13/2023: Right: shoulder flexion: 3/5, abduction: 4/5, elbow flexion: 4+/5 , extension: 4-/5 wrist extension: 2/5; Left: shoulder flexion: 4+/5, abduction: 4+/5, elbow flexion: 5/5 , extension: 4/5  wrist extension: 3/5 03/08/23: NT this date d/t as pt arrived late; 02/13/2023: Continue 01/09/2023: Continue 12/28/2022: Right: shoulder flexion: 3/5, abduction: 4/5, elbow flexion: 4+/5 , extension: 4-/5 wrist extension: 2/5; Left: shoulder flexion: 4+/5, abduction: 4+/5, elbow flexion: 5/5 , extension: 4/5  wrist extension: 3/5 Right: shoulder flexion: 3+/5, abduction: 4/5, elbow flexion: 4+/5 , extension: 4+/5 wrist extension: 2/5; Left: shoulder flexion: 4+/5, abduction: 4+/5, elbow flexion: 5/5 , extension: 4+/5 wrist extension: 3-/5 11/16/2022: Right: shoulder flexion: 3/5, abduction: 4/5, elbow flexion: 4+/5 , extension: 4+/5 wrist extension: 2/5; Left: shoulder flexion: 4+/5, abduction: 4+/5, elbow flexion: 5/5 , extension:  wrist extension: 3-/5 Right: shoulder flexion: 3+/5, abduction: 4/5, elbow flexion: 4+/5 , extension: 4+/5 wrist extension: 2/5; Left: shoulder flexion: 4+/5, abduction: 4+/5, elbow flexion: 5/5 , extension: 4+/5 wrist extension: 3-/5 Goal status: Ongoing  11. Pt. will improve right wrist extension by 10 degrees to initiate reaching for items in preparation for ADLs. Baseline: 08/23/2023: R Wrist Ext: -31(-8)  08/07/23: Continue 05/31/23: Continue 05/10/23: Right wrist extension: -32(22) 03/13/23: -22(35) 03/08/23: NT this date as pt arrived late; 02/13/2023: Continue 01/09/2023: Continue 12/28/2022: -22(34) 11/16/2022: -30(10)    Goal status: Ongoing      12. Pt. Will require donn/doff a Tank top T-shirt efficiently with supervision and full set-up.    Baseline:08/23/23: Pt. Is able to independently doff his shirt, requiring assist to lean forward and loosen the back. 08/07/23: Continue 05/31/2023: Pt. Requires  maxA donning a pullover sweatshirt, Mod-maxA doffing it. T-shirt tank TBD 05/10/23: Pt. Presents with difficulty completing, and requires increased time to complete    Goal status: Ongoing    13. Pt. Will independently perform bilateral/bimanual hand tasks needed to fold towels/linens/laundry Baseline:08/23/23: Pt. Continues to have difficulty using bilateral/bimanual hand tasks needed to fold towels/linens/laundry. 08/07/23: Continue 05/31/23: Continue 05/10/23:  Pt. Has difficulty folding towels/lines/laundry using bilateral hands Goal status:Ongoing  14. Pt. Will independently, and efficiently reach his right arm over the side of the chair to pet his dog.    Baseline: 08/23/2023: Pt. Is now able to reach over the side of the armrest of his recliner chair to pet his dog.  08/07/23: Continue 05/31/2023: Continue 05/10/23: Pt.  is unable to efficiently reach his right UE over the side of his recliner chair to pet his dog.    Goal status: Achieved    15. Pt. Will independently, and efficiently reach out in front of him to efficiently place items onto his table while sitting in his recliner.     Baseline:  08/23/2023: Pt. has difficulty consistently reaching out in front of him to place items on the on a tabletop surface with the right hand. 08/07/23: Continue 2/26/225: Continue 05/10/23: Pt. has difficulty reaching out in front of him far enough to efficiently place items on the table while sitting in a recliner chair.    Goal status: Ongoing   ASSESSMENT:  CLINICAL IMPRESSION:    Pt. reports that he hasn't had any popping in the elbow.  Pt. Tolerated 2.5# for the RUE, and 2.5# initially increasing to 7.5# with the left UE. Pt. Required assist to support his RUE through all movements. Pt. requires visual, and verbal cues. Pt. was able to consistently initiate reaching out to grasp the parallel bars from a seated position. Pt. continues to be highly motivated, and Pt.'s caregivers continue to be very supportive  to continue working towards improving BUE functioning, ROM, strength, motor control, [facility] skills, as well as visual compensatory strategies in order to maximize independence with daily ADLs, and IADL functioning.      PERFORMANCE DEFICITS in functional skills including ADLs, IADLs, coordination, dexterity, proprioception, ROM, strength, pain, FMC, GMC, decreased knowledge of use of DME, and UE functional use, cognitive skills including safety awareness, and psychosocial skills including coping strategies, environmental adaptation, habits, and routines and behaviors.   IMPAIRMENTS are limiting patient from ADLs, IADLs, leisure, and social participation.   COMORBIDITIES may have co-morbidities  that affects occupational performance. Patient will benefit from skilled OT to address above impairments and improve overall function.  MODIFICATION OR ASSISTANCE TO COMPLETE EVALUATION: Maximum or significant modification of tasks or assist is necessary to complete an evaluation.  OT OCCUPATIONAL PROFILE AND HISTORY: Comprehensive assessment: Review of records and extensive additional review of physical, cognitive, psychosocial history related to current functional performance.  CLINICAL DECISION MAKING: High - multiple treatment options, significant modification of task necessary  REHAB POTENTIAL: Fair    EVALUATION COMPLEXITY: High    PLAN: OT FREQUENCY: 2 x's a week  OT DURATION:12 weeks  PLANNED INTERVENTIONS: self care/ADL training, therapeutic exercise, therapeutic activity, neuromuscular re-education, manual therapy, passive range of motion, patient/family education, and cognitive remediation/compensation RECOMMENDED OTHER SERVICES: PT  CONSULTED AND AGREED WITH PLAN OF CARE: Patient and family member/caregiver  PLAN FOR NEXT SESSION: Review HEP  Richardson Otter, MS, OTR/L  09/20/23

## 2023-09-27 ENCOUNTER — Ambulatory Visit: Payer: Medicare Other | Admitting: Occupational Therapy

## 2023-09-27 ENCOUNTER — Ambulatory Visit: Payer: Medicare Other

## 2023-09-27 DIAGNOSIS — R262 Difficulty in walking, not elsewhere classified: Secondary | ICD-10-CM | POA: Diagnosis not present

## 2023-09-27 DIAGNOSIS — R2689 Other abnormalities of gait and mobility: Secondary | ICD-10-CM

## 2023-09-27 DIAGNOSIS — M6281 Muscle weakness (generalized): Secondary | ICD-10-CM | POA: Diagnosis not present

## 2023-09-27 DIAGNOSIS — R278 Other lack of coordination: Secondary | ICD-10-CM

## 2023-09-27 DIAGNOSIS — R2681 Unsteadiness on feet: Secondary | ICD-10-CM

## 2023-09-27 DIAGNOSIS — R269 Unspecified abnormalities of gait and mobility: Secondary | ICD-10-CM | POA: Diagnosis not present

## 2023-09-27 DIAGNOSIS — I6930 Unspecified sequelae of cerebral infarction: Secondary | ICD-10-CM | POA: Diagnosis not present

## 2023-09-27 NOTE — Therapy (Unsigned)
 OUTPATIENT PHYSICAL THERAPY TREATMENT    Patient Name: Paul Hall MRN: [id number] DOB:March 02, 1996, 28 y.o., male Today's Date: 09/28/2023  PCP:  [facility] PROVIDER:  Morene Sous, PA-C   PT End of Session - 09/27/23 1615     Visit Number 164    Number of Visits 184   date corrected from most recent recert   Date for PT Re-Evaluation 11/27/23    Authorization Type [employer] [place]    Authorization Time Period 01/04/21-03/29/21; Recert 03/24/2021-06/16/2021; Recert 09/15/2021- 12/08/2021; Recert 12/13/2021-03/07/2022    Progress Note Due on Visit 170    PT Start Time 1616    PT Stop Time 1658    PT Time Calculation (min) 42 min    Equipment Utilized During Treatment Gait belt    Activity Tolerance Patient tolerated treatment well    Behavior During Therapy WFL for tasks assessed/performed                            Past Medical History:  Diagnosis Date   Diabetes mellitus (HCC)    Hypertension    Stroke (HCC)    Past Surgical History:  Procedure Laterality Date   IVC FILTER PLACEMENT ([facility] HX)     LEG SURGERY     PEG TUBE PLACEMENT     TRACHEOSTOMY     Patient Active Problem List   Diagnosis Date Noted   Sepsis due to vancomycin  resistant Enterococcus species (HCC) 06/06/2019   SIRS (systemic inflammatory response syndrome) (HCC) 06/05/2019   Acute lower UTI 06/05/2019   VRE (vancomycin -resistant Enterococci) infection 06/05/2019   Anemia 06/05/2019   Skin ulcer of sacrum with necrosis of muscle (HCC)    Urinary retention    Type 2 diabetes mellitus without complication, with long-term current use of insulin  (HCC)    Tachycardia    Lower extremity edema    Acute metabolic encephalopathy    Obstructive sleep apnea    Morbid obesity with BMI of 60.0-69.9, adult (HCC)    Goals of care, counseling/discussion    Palliative care encounter    Sepsis (HCC) 04/27/2019   H/O insulin  dependent diabetes mellitus 04/27/2019   History of  CVA with residual deficit 04/27/2019   Seizure disorder (HCC) 04/27/2019   Decubitus ulcer of sacral region, stage 4 (HCC) 04/27/2019    REFERRING DIAG: Cerebral infarction, unspecified   THERAPY DIAG:  Muscle weakness (generalized)  Other lack of coordination  Difficulty in walking, not elsewhere classified  Abnormality of gait and mobility  Unsteadiness on feet  Other abnormalities of gait and mobility  History of CVA with residual deficit  Rationale for Evaluation and Treatment Rehabilitation  PERTINENT HISTORY: Paul Hall is a 26yoM who presents with severe weakness, quadriparesis, altered sensorium, and visual impairment s/p critical illness and prolonged hospitalization. Pt hospitalized in October 2020 with ARDS 2/2 COVID19 infection. Pt sustained a complex and lengthy hospitalization which included tracheostomy, prolonged sedation, ECMO. In this period pt sustained CVA and SDH. Pt has now been liberated from tracheostomy and G-tube. Pt has since been hospitalized for wound infection and UTI. Pt lives with parents at home, has hospital bed and left chair, hoyer lift transfers, and power WC for mobility needs. Pt needs heavy physical assistance with ADL 2/2 BUE contractures and motor dysfunction   PRECAUTIONS: Fall  SUBJECTIVE:  Patient reports he had a good session in OT and able to use his arm better with resistive training.  PAIN:  Are you having pain? No. But feeling tight.    TODAY'S TREATMENT:  - 09/27/2023           TA:  -Introduced patient to // bars- assisted patient (dependent)  in mobilizing chair into bars- slow and careful due to narrow error margin- Chair did fit and positioned armrest down so patient could practice attempting to grip bars with each hand- close with left and able to place right hand on top of bar but unable to grip. He spent several min attempting to grip bars and become more familiar with being in // bars.  -Anterior trunk lean with  hands on bars x 20 reps  (VC for technique)  -lateral trunk lean- Attempting to scoot unilateral hip forward x 15 reps each side (unable to facilitate his glutes forward- but was able to lateral shift in // bars well.  -Instructed in pressing heels down into foot rest to facilitate some LE weight bearing with scooting forward - patient with some difficulty yet able to obtain some active ankle mobility each side x approx 20 reps.   Seated assisted Hip IR each LE x 20  Seated quad sets with 5 sec hold x 10  -Seated LAQ x 15 reps alt LE   PATIENT EDUCATION: Education details: Exercise technique with VC   HOME EXERCISE PROGRAM:  Access Code: [email or web address] URL: [email or web address] Date: 03/23/2022 Prepared by: Reyes London  Exercises - Supine Bridge  - 3 x weekly - 3 sets - 10 reps - 2 hold - Supine Gluteal Sets  - 3 x weekly - 3 sets - 10 reps - 5 sec hold - Supine Quad Set  - 1 x daily - 3 x weekly - 3 sets - 10 reps - 5 hold - Seated Long Arc Quad  - 1 x daily - 7 x weekly - 3 sets - 10 reps - Seated Hip Adduction Squeeze with Ball  - 1 x daily - 3 x weekly - 3 sets - 10 reps - 5 hold - Seated Hip Abduction  - 1 x daily - 3 x weekly - 3 sets - 10 reps - 2 hold    PT Short Term Goals -       PT SHORT TERM GOAL #1   Title Pt will be independent with HEP in order to improve strength and balance in order to decrease fall risk and improve function at home and work.    Baseline 01/04/2021= No formal HEP in place; 12/12 no HEP in place; 05/10/2021-Patient and his father were able to report compliance with curent HEP consisting of mostly seated/reclined LE strengthening. Both verbalize no questions at this time.    Time 6    Period Weeks    Status Achieved    Target Date 02/15/21            PT LONG TERM GOAL #1     Title Patient will increase BLE gross strength by 1/2 muscle grade to improve functional strength for improved independence with potential gait, increased  standing tolerance and increased ADL ability.     Baseline 01/04/2021- Patient presents with 1/5 to 3-/5 B LE strength with MMT; 12/12: goal partially met for Left knee/hip; 05/10/2021= 2-/5 bilateal Hip flex; 3+/5 bilateral Knee ext; 06/21/2021= Patient presents with 2-/5 bilateral Hip flex; 3+/5 bilateral knee ext/flex; 2-/5 left ankle DF; 0/5 right ankle- and able to increase reps and resistance with LE's. 09/15/2021- Patient technically presents with 2-/5 B hip flex/abd/add - but  he is able to raise his hip up to approx 100 deg which has improved. 3+/5 Bilateral knee ext, 2-/5 left ankle and 0/5 right ankle.  12/08/2021= Patient able to lift left knee at 110 deg of hip flex; presents with 3+/5 knee ext, 2-/5 left ankle DF and 0/5 right ankle DF, 2-/5 bilateral Hip abd in seated position.    12/6: R: knee 3+/5 ext, 2/5 flexion, left knee 3+/5 extension, 3+/5 flexion, R hip: 2+/5 hip add, 2+/5 hip ABD L hip: 4-/5 hip ABD, 3+/5 hip ADD, 3+/5 hip flexion; 06/06/2022= Patient now presents with 2-/5 right ankle DF/PF;     Time 12     Period Weeks     Status MET    Target Date 03/07/2022         PT LONG TERM GOAL #2    Title Patient will tolerate sitting unsupported demonstrating erect sitting posture for 15 minutes with CGA to demonstrate improved back extensor strength and improved sitting tolerance.     Baseline 01/04/2021- Patient confied to sitting in lift chair or electric power chair with back support and unable to sit upright without physical assistance; 12/12: tolerates <1 minutes upright unsupported sitting. 05/10/2021=static sit with forward trunk lean  in his power wheelchair without back support x approx 3 min. 06/21/2021=Unable to assess today due to patient with acute back pain but on previous visit able to sit x 8 min without back support. 09/15/2021- on last visit- 09/13/2021- patient was able to sit unsupported x 8 min at edge of mat. 10/13/2023 - Patient was able to sit at edge of mat with varying level  of assist today from SBA to min A for a total of 20 min. 12/13/2021= Patient demonstrated unsupported sitting at edge of mat for approx 20 min    Time 12     Period Weeks     Status GOAL MET    Target Date 12/08/21          PT LONG TERM GOAL #3    Title Patient will demonstrate ability to perform static standing in // bars > 2 min with Max Assist  without loss of balance and fair posture for improved overall strength for pre-gait and transfer activities.     Baseline 01/04/2021= Patient current uanble to stand- Dependent on hoyer or sit to stand lift for transfers. 05/10/2021=Not appropriate yet- Currently still dependent with all transfers using hoyer. 06/21/2021= Patient continuing now to focus on LE strengthening to prepare for standing-unable to try today due to acute low back pain-  planning on attempting in new cert period. 09/15/2021- Patient has attempted standing 2x in past two week- max Assist of 2 people - only once was he successful to clearing his bottom from chair - Will continue to be a focus during the new certification. 12/13/2021= Patient has been limited secondary to increased overall low back pain during this certification and will require more time to focus on this goal.  12/6: not assessed this date, will assess at date when 2-3 PTs are present for assistance     Time 12     Period Weeks     Status GOAL not appropriate at this time - may attempt in future once Patient presents with improved overall LE strength.                 PT LONG TERM GOAL #4    Title Pt will improve FOTO score by 10 points or more demonstrating improved perceived functional  ability     Baseline FOTO 7 on 10/17; 03/15/21: FOTO 12; 05/10/2021 06/21/2021= 1; 09/15/2021= 9; 12/13/2021= Will issue next visit 12/6: 4; 06/06/2022= Will assess next visit; Goal not appropriate as patient is not ambulatory.     Time 12     Period Weeks     Status WITHDRAWN    Target date 11/30/2022         PT LONG TERM GOAL #5    Title  Patient will perform sit to stand transfer with appropriate AD and max assist of 2 people with 75% consistency to prepare for pregait activities.     Baseline 09/15/2021= Patient unable to stand well- unable to clear his bottom off chair with Max assist of 2 persons. 12/13/2021- Goal not appropriate to try yet but will keep and roll over to next cert as shift continues to focus on transfers/standing; 4/24= Patient able to perform active ankle DF/PF with right LE and able to raise his knee into seated march and clear floor without physical assist today - as previously unable as well as lift right knee ext to near full ROM to improve strength for eventual standing. 09/07/2022- Goal continues to not be appropriate but patient is now standing some and will keep goal active; 11/23/2022= patient is now performing sit to stand with max A +2 and now able to clear his bottom off mat. 11/30/2022- Patient continues to perform sit to stand transfers with Max A+2 and able to clear bottom well off mat but not erect yet. 05/31/2023- Patient is able to stand consistently with max A +2 - unable to achieve full knee or trunk ext yet is able to clear bottom off mat for improved LE weightbearing. 09/04/2023- Patient performed several Sit to stand requiring max A+2  with improving posture- able to ext R knee better - near terminal knee and able to clear bottom off mat- still not full but able to contract glutes for standing as erect as possible.     Time 12     Period Weeks     Status PROGRESSING    Target date  11/27/2023       PT LONG TERM GOAL #6  Title Patient will tolerate sitting unsupported demonstrating erect sitting posture for 30 minutes with CGA to demonstrate improved back extensor strength and improved sitting tolerance.   Baseline 12/13/2021= Patient demonstrated unsupported sitting at edge of mat for approx 20 min;  12/29/2021- Patient performed approx 30 min of dynamic sitting activities today. 06/06/2022= Patient  demonstrated ability to sit and perform static and dynamic UE/LE movement with only Supervision.   Time 12   Period Weeks   Status GOAL MET           7.  Patient will tolerate 2 minutes or more of standing in sit to stand lift or with max assist +2 in order to indicate improved lower extremity weightbearing tolerance for progression to standing in parallel bars. Baseline: 1 minute on most recent stand 02/21/22; 06/06/2022- Patient did attempt today after complaining of right LE pain last week. He attempted 3 stands using sit to stand lift. - all over 1 min- last one approx 48 sec- stopped due to fatigue. More erect standing in lift today - still poor gluteal strength but able to activate glutes and extend hips upon command but unable to hold > 5 sec. 07/28/2022- Will attempt standing next visit but patient has been able to stand since last progress note for up to 3  min at a time at his best. 09/07/2022-  attempted standing with max +2 and patient able to stand 15-20 sec so will now  revise goal; 11/23/2022- Patient at his best between last 2 visits was able to stand approx 50 sec with max A+2.  11/30/2022- Patient performed 4 trials of static standing - max A +2- clearing bottom and using forearms to bear weight  03/15/2023: Patient performed 2 trials of static standing - max A +2- clearing edge of mat and using forearms to bear weight for approximately 45 seconds, is able to come into approximately 45 degrees of knee flexion from sit to stand; 05/31/2023- Patient has not attempted standing in 1 month but able to perform x 4 trials with max A +2 (PT and his mom) and able to clear his bottom off mat and attempted to activate his glutes and apply some forearm pressure - able to stand between 25-40 sec today. 09/04/2023- Patient performed 3 trials of standing ranging from 17 -47 sec - improving right knee terminal knee ext and more activation upon command with gluteals- able to clear mat table. Continued to require  Max A+2 and blocking knees with forearm/trunk support.   Goal status: PROGRESSING Target date: 11/27/2023   8. Patient will tolerate sitting unsupported demonstrating ability to perform dynamic UE/LE activities for 30 minutes  independently to demonstrate ability to sit at edge of bed to eat or perform some ADL's/exercise for optimal quality of life.             Baseline: 06/06/2022- Patient able to sit and perform some UE/LE exercises but requires CGA at times for safety- he is able to static sit for 30 min with supervision. 07/27/2022= Patient able to now sit > 30 min with Supervision only - performing static and dynamic activities. Will keep goal active to ensure patient is consistent. 09/07/2022=  Patient able to demonstrate consistent ability to sit at edge of mat during visits             Goal Status: MET             Target date: 08/29/2022  9.  Patient will perform rolling left to right with Mod assist for improved independence with bed mobility to assist with dressing and positioning in bed. Baseline: 09/04/2023- Mother reported last visit that he was having some difficulty with assisting with bed mobility. Goal status: NEW Target date: 11/27/2023    10. Patient will perform active bridging -able to clear bottom   Off mat for 10 reps with PT/caregiver holding his feet    For improved trunk ext strength with standing and to    To assist with be mobility/positioning.  Baseline: 09/04/2023= (PT assisted- holding right LE In neutral and pressure on each foot) - Difficulty clearing bottom off mat. Goal status: NEW Target date: 11/27/2023           Plan     Clinical Impression Statement  Treatment consisted on attempting to acclimate patient toward more standing and maybe future attempts within // bars. He attempted reaching for bars, practicing scooting and leaning forward, and attempted to grip the bars. He was unsuccessful with gripping the bars efficiently and experienced difficulty with scooting  but performed well attempting new tasks. In the past his anxiety limits him at times and trying new activities has proven challenging. He appeared to thrive today at the chance to be in the // bars and even though a very tight fit with chair he was  at least successful in practice some pre-standing activities. Discussed maybe using a manual w/c in future with standing that would allow to remove the leg extensions.   Pt will continue to benefit from skilled physical therapy intervention to address impairments, improve QOL, and attain therapy goals.    Personal Factors and Comorbidities Comorbidity 3+;Time since onset of injury/illness/exacerbation    Comorbidities CVA, diabetes, Seizures    Examination-Activity Limitations Bathing;Bed Mobility;Bend;Caring for Others;Carry;Dressing;Hygiene/Grooming;Lift;Locomotion Level;Reach Overhead;Self Feeding;Sit;Squat;Stairs;Stand;Transfers;Toileting    Examination-Participation Restrictions Cleaning;Community Activity;Driving;Laundry;Medication Management;Meal Prep;Occupation;Personal Finances;Shop;Yard Work;Volunteer    Stability/Clinical Decision Making Evolving/Moderate complexity    Rehab Potential Fair    PT Frequency 1-2x / week    PT Duration 12 weeks    PT Treatment/Interventions ADLs/Self Care Home Management;Cryotherapy;Electrical Stimulation;Moist Heat;Ultrasound;DME Instruction;Gait training;Stair training;Functional mobility training;Therapeutic exercise;Balance training;Patient/family education;Orthotic Fit/Training;Neuromuscular re-education;Wheelchair mobility training;Manual techniques;Passive range of motion;Dry needling;Energy conservation;Taping;Visual/perceptual remediation/compensation;Joint Manipulations    PT Next Visit Plan core strength/motor control in short-sitting, sit to stand if able; Continue with progressive LE Strengthening. Stand activities as appropriate.    PT Home Exercise Plan No changes to HEP today    Consulted and Agree with  Plan of Care Patient;Family member/caregiver    Family Member Consulted             03/09/2023, 8:04 AM      11:38 AM, 09/28/23 Reyes LOISE London PT Physical Therapist - [facility]  11:38 AM 09/28/23

## 2023-09-28 NOTE — Therapy (Addendum)
 . Occupational Therapy Neuro Treatment Note  Patient Name: Paul Hall MRN: [id number] DOB:05/21/1995, 28 y.o., male Today's Date: 09/28/2023  PCP: Dr. Bertell REFERRING PROVIDER: Dr. Bertell    OT End of Session - 09/28/23 1020     Visit Number [id number]    Authorization Type 09/26/23    OT Start Time 1530    OT Stop Time 1615    OT Time Calculation (min) 45 min    Equipment Utilized During Treatment tilt in space power wc    Activity Tolerance Patient tolerated treatment well    Behavior During Therapy WFL for tasks assessed/performed                      Past Medical History:  Diagnosis Date   Diabetes mellitus (HCC)    Hypertension    Stroke [facility])    Past Surgical History:  Procedure Laterality Date   IVC FILTER PLACEMENT ([facility] HX)     LEG SURGERY     PEG TUBE PLACEMENT     TRACHEOSTOMY     Patient Active Problem List   Diagnosis Date Noted   Sepsis due to vancomycin  resistant Enterococcus species (HCC) 06/06/2019   SIRS (systemic inflammatory response syndrome) (HCC) 06/05/2019   Acute lower UTI 06/05/2019   VRE (vancomycin -resistant Enterococci) infection 06/05/2019   Anemia 06/05/2019   Skin ulcer of sacrum with necrosis of muscle (HCC)    Urinary retention    Type 2 diabetes mellitus without complication, with long-term current use of insulin  (HCC)    Tachycardia    Lower extremity edema    Acute metabolic encephalopathy    Obstructive sleep apnea    Morbid obesity with BMI of 60.0-69.9, adult (HCC)    Goals of care, counseling/discussion    Palliative care encounter    Sepsis (HCC) 04/27/2019   H/O insulin  dependent diabetes mellitus 04/27/2019   History of CVA with residual deficit 04/27/2019   Seizure disorder (HCC) 04/27/2019   Decubitus ulcer of sacral region, stage 4 (HCC) 04/27/2019   ONSET DATE: 01/2019  REFERRING DIAG: CVA/COVID-19  THERAPY DIAG:  Muscle weakness (generalized)  Rationale for Evaluation and Treatment  Rehabilitation  SUBJECTIVE:   SUBJECTIVE STATEMENT:  Pt. Reports that he  is doing well today.  Pt accompanied by: self and family member  PERTINENT HISTORY:  Pt. is a 28 y.o. male who was diagnosed with COVID-19, and CVA with resultant quadriplegia. Pt. Was then hospitalized with VRE UTI. PMHx includes: urinary retention, seizure disorder, obstructive sleep disorder, DM Type II, Morbid obesity.   PRECAUTIONS: None  WEIGHT BEARING RESTRICTIONS No  PAIN:  No reports of pain Are you having pain? no  FALLS: Has patient fallen in last 6 months? No  LIVING ENVIRONMENT: Lives with: lives with their family Lives in: House/apartment Stairs: No Level Entry Has following equipment at home: Wheelchair (power) and hoyer lift, sit to stand lift  PLOF: Independent  PATIENT GOALS: To be able to engage in more daily care tasks.  OBJECTIVE:   HAND DOMINANCE: Right  ADLs:  Transfers/ambulation related to ADLs: Eating: Pt. reports being able to hold standard utensils, and is starting to engage more in self-feeding tasks, hand to mouth patterns. Pt. Reports that he does as much as he can with the task, and family assists  with the remainder of the task. Grooming: Pt. Is able to initiate holding an electric toothbrush, and brush his teeth. Family assists LB Dressing: Total Assist UE dressing: Pt. is now  able to reach up to actively assist with grasping , and pulling his gown down. Toileting:  Total Assist Bathing: MaxA UB, Total assist LB Tub Shower transfers: N/A Equipment: See above    IADLs: Shopping: Relies on family to assist Light housekeeping: Total Assist Meal Prep: Total Assist Community mobility:   Medication management:  Total Assist  Financial management: N/A Handwriting: Not legible: Pt. Is able to hold a pen with the left hand, and initiate marking the page. Pt.'s eye glasses were not available  MOBILITY STATUS:  Power w/c  POSTURE COMMENTS:  Pt. Requires  position changes in his power w/c  ACTIVITY TOLERANCE: Activity tolerance:  Fair  FUNCTIONAL OUTCOME MEASURES: FOTO: 40 TR score: 45  05/25/2022:   FOTO: 50 TR score: 45  07/20/22: FOTO 55  03/08/23: 51  UPPER EXTREMITY ROM     Active ROM Left eval Left  03/21/2022 Left 05/25/2022 L  07/20/22 Left 07/25/2022 Left  08/15/2022 Left  09/26/2022 Left  11/16/2022 Left 12/28/2022 Left 03/13/23 Left 05/10/23  Shoulder flexion 119 123 125 130  136 138 130 142 144 144  Shoulder abduction 110 115 115 115  118 121 125 142 140 140  Shoulder adduction             Shoulder extension             Shoulder internal rotation             Shoulder external rotation             Elbow flexion 135(135) 145 145 145  145    145 145  Elbow extension -27(-18) -21(-20) -20(-16)  -25(-10) -23(-10) -21(-10) -20(-18) -20(-18) -20(-17) -17(-16)  Wrist flexion             Wrist extension 10(50) 19(50) 28(50)  30(50) 35(55) 36(55) 36(60) 40(60) 45(60) 45(60)  Wrist ulnar deviation             Wrist radial deviation             Wrist pronation             Wrist supination   Limited by flexor tone       10(15)   (Blank rows = not tested)    Active ROM Right eval Right 03/21/2022 Right 05/25/2022 R  07/20/22 Right  07/25/22 Right 08/15/2022 Right 09/26/2022 Right 11/16/2022 Right 12/28/2022 Right 03/13/23 Right 05/10/23  Shoulder flexion 106 scaption 105  Scaption 62 flexion 110 scaption 100  105 105 105 112 Scaption 123 Scaption 122  Scaption  Shoulder abduction 114 90 97 100  105 117 120 124 124 122  Shoulder adduction             Shoulder extension             Shoulder internal rotation             Shoulder external rotation             Elbow flexion 120(130) 145 145 140  144 140 142 148 148 145  Elbow extension -45(-35) -22(-35) -28(-22)  -32(-21) -32(-28) -28(-26) -28(-28) -27(-26) -27(-40) -38(-35)  Wrist flexion             Wrist extension -30(10) -25(10) -10(30)  -10(20) -10(25) -20(40)  -30(10) -22(34) -22(35) -32(22)  Wrist ulnar deviation             Wrist radial deviation             Wrist pronation  Wrist supination   Limited by flexor tone            12/28/2022: Thumb radial abduction: Right: 12(46), Left: 40(54)  Digits: Bilateral 2nd through 5th digit: MP Hyperextension with PIP, an DIP flexion     UPPER EXTREMITY MMT:     Left eval Left 03/21/2022 Left  05/25/2022 L 07/20/22 Left  08/15/2022 Left 09/26/2022 Left 11/16/2022 Left 12/28/2022 Left  03/13/2023 Left  05/10/23  Shoulder flexion 3-/5 3/5 3+/5 4+/5 4+/5 4+/5 4+/5 4+/5 4+/5 4+/5  Shoulder abduction 3-/5 3-/5 4-/5 4+/5 4+/5 4+5 4+/5 4+/5 4+/5 4+/5  Shoulder adduction            Shoulder extension            Shoulder internal rotation            Shoulder external rotation            Elbow flexion 3/5 3+/5 4-/5 4+/5 5/5 5/5 5/5 5/5 5/5 5/5  Elbow extension 2-/5 2/5  4+/5 4+/5 4+/5  4/5 4/5 4/5 through available range  Wrist flexion            Wrist extension 2/5 2+/5 3-/5  3-/5 3-/5 3-/5 3/5 3/5 3/5  Wrist ulnar deviation            Wrist radial deviation            Wrist pronation            Wrist supination            (Blank rows = not tested)  Right eval Right 03/21/2022 Right  05/25/2022 R 07/20/22 Right 08/15/2022 Right 09/26/2022 Right 11/16/2022 Right 12/28/2022 Right 03/13/2023 Right 05/10/23  Shoulder flexion 3-/5 scaption 3-/5 3-/5 3+/5 3+/5 3+/5 3/5 3/5 3/5 3+/5  Shoulder abduction 3-/5 3-/5 3-/5 4/5 4/5 4/5 4/5 4/5 4/5 4/-5  Shoulder adduction            Shoulder extension            Shoulder internal rotation            Shoulder external rotation            Elbow flexion 3/5 3/5 3+/5 4/5 4+/5 4+/5 4+/5 4+/5 4+/5 4+/5  Elbow extension 2-/5 2/5 3-/5 4+/5 4+/5 4+/5 4+/5 4-/5 4-/5 4-/5  Wrist flexion            Wrist extension 2-/5 2-/5 2/5 2/5 2/5 2/5 2/5 2/5 2/5 2/5  Wrist ulnar deviation            Wrist radial deviation            Wrist pronation            Wrist  supination              HAND FUNCTION:  Grip strength: Right: 0 lbs; Left: 0 lbs and Lateral pinch: Right: 5 lbs, Left: 2 lbs  03/21/2022: Lateral pinch: Right: 3.5 lbs, Left: 2 lbs  07/20/22: Grip strength: Right: 3 lbs; Left: 4 lbs and Lateral pinch: Right: 5 lbs, Left: 3 lbs  08/15/2022:  Grip strength: Right: 0 lbs; Left: 5 lbs and Lateral pinch: Right: 2 lbs, Left: 4 lbs  09/26/2022  Grip strength: Right: 0 lbs; Left: 8 lbs and Lateral pinch: Right: 2 lbs, Left: 5 lbs  11/16/2022  Grip strength: Right: 0 lbs; Left: 8 lbs  9/25/20204  Grip strength: Right: 0 lbs; Left: 8 lbs  Grip strength: Right: 0  lbs; Left: 8 lbs   03/08/23:  Grip strength: Right: 0 lbs without PROM, 5 lbs after PROM, Left 8 lbs  05/10/23:  Grip strength: TBD  Bilateral digit PIP/DIP flexion contractures with MP hyperextension with attempts for AROM. Pt. is able to tolerate AROM to the bilateral digits at the initial evaluation however, has a history of pain in the digits.  COORDINATION: Eval: Pt. is unable to grasp 9-hole test pegs. Pt. is able to initiate grasping larger pegs, and is able to hold a pen in the left hand.  07/20/22: 2 min 36 seconds to remove 9 pegs from 9 hole peg test - cues to locate pegs 2/2 low vision. Pt. is able to initiate grasping larger pegs on R hand and is able to hold a pen in the left hand.  08/15/2022:    Vision, and sensation limiting accuracy of 9 hole peg test results. Pt. Was able to grasp and remove vertical pegs intermittently with cues.    05/10/23: TBD  SENSATION: Light touch: Impaired   EDEMA:  N/A  MUSCLE TONE: BUE flexor Spasticity  COGNITION Overall cognitive status: Continue to assess in functional context  VISION:   Subjective report: Pt. was not wearing glasses at the time of the initial eval.  Baseline vision: Vision is very limited. Wears glasses all the time Visual history: History of impaired vision following CVA. Pt. Has received  treatment through the [facility] low vision rehabilitation program.   VISION ASSESSMENT: Impaired To be further assessed in functional context  PERCEPTION: Impaired   PRAXIS: Impaired: motor planning  OBSERVATIONS:  Pt reports being on Tramadol   TODAY'S TREATMENT  09/27/23   Therapeutic Ex.:  -BUE strengthening for elbow extension through multiple planes using then [employer] with 4.5# on the right,  and 7.5# increasing to 9.5# on the left. -Pt. worked on [employer], and reciprocal motion using the UBE while seated with minimal resistance. Constant monitoring was provided and assist/support proximally at each UE. The task was modified to use one arm at a time. -Pt. was assisted with trunk control. -Emphasis was placed on reaching out for the handles with wrist, and digit MP, and PIP extension.  Therapeutic Activities:  -Facilitated Bilateral alternating UE resistive movements with the red theraband for elbow flexion and extension.      PATIENT EDUCATION: Education details: stretching, PROM, massage to the digits on the bilateral hands. Person educated: Patient and Parent Education method: Explanation Education comprehension: verbalized understanding  HOME EXERCISE PROGRAM:  Continue ongoing assessment, and continue to provide as needed.   GOALS: Goals reviewed with patient? Yes  SHORT TERM GOALS: Target date: 09/18/2023     To assess splint fit, and make appropriate adjustments to promote good skin integrity through the palmar surface of the bilateral hands.  Baseline: 05/25/22: Goal currently met, however ongoing as needs to assess splint fit arise. 03/23/2022: Pt. is wearing splints a couple of hours at night bilateral resting hand splints. 03/21/2022: Pt. is wearing splints a couple of hours at night bilateral resting hand splints. Goal status: Deferred   LONG TERM GOALS: Target date:10/30/2023      FOTO score will Improve by 2 points for Pt. perceived improvement  with the assessment specific ADL/IADL tasks.  Baseline:  03/08/23: 51; 12/28/2022: FOTO score: 56; 11/16/2022: 52 09/27/2022: 51 07/20/2022: FOTO 55 05/25/2022: FOTO score: 50,  TR score: 45 Eval: FOTO score: 40,  TR score: 45 Goal status: Achieved   2.   Pt. will independently  perform oral care for 100% of the task after complete set-up. Baseline: 05/10/23: Pt. Performs 90% of the task. Pt.'s mother assists with 10% of the task for thorough cleaning the hard to reach places way in the back of the oral cavity.03/08/23: not as much assist required proximally, but to adjust positioning of toothbrush; 02/13/23: Pt. Continues to require assist proximally 01/09/2023: Continue 12/28/2022:  Pt. Continues to complete 90% of the task with complete set-up, with visual cues, and occassional assist of the right elbow. 11/16/2022: Pt. Completes 90% of the task with complete set-up. 09/26/2022: Completes 90% of the task, proximal assist at times required at the elbow.  08/15/2022: Completes 90% of the task, proximal assist at times required at the elbow. 07/20/22: completes 90% of task, limited by shoulder flexion. 05/25/2022:  Pt. Is able to initiate and perform oral care for approximately 90% of the task. Complete set-up required. Assi needed only for the very back teeth. 03/23/2022: Pt. Is able to initiate and perform oral care for approximately 75% of the task. Pt. Requires assist at proximally at the elbow for through oral care. 03/21/2022: Pt. Is able to initiate and perform oral care for approximately 75% of the task. Pt. Requires assist at proximally at the elbow for through oral care. Eval: Pt. is able to initiate using an electric toothbrush. Pt. requires assist for set-up, and assist for thoroughness, and as he Pt. fatigues. Goal status:  Partially met, Goal discontinued  3.  Pt. Will be modified independence with self-feeding for 100% using a swivel spoon, and standard fork after complete set-up Baseline: 05/10/2023: Pt.  feeds self 95-100% of the meal. with occasional assist to reposition the utensil. 03/08/23: indep with cup with a handle and lid, assist to reposition eating utensils in hand;  02/13/23:  Pt. Is now able to consistently, and efficiently use a swivel spoon for eating cereal with the left hand.01/09/2023: Continue 12/28/2022: Pt. Is now feeding himself 90% of the time using a swivel spoon with the left hand, and 95% of the time with the fork. Pt. Continues to have difficulty with cutting food. 11/16/2022: 100% with finger foods, Pt. Is initiating with a swivel spoon, and universal cuff for fork use, however requires assist for consistency, and accuracy.  09/26/2022: 90% using the spoon, assist at time with the forearm motion, and grip on the utensils. 100% for finger foods.  08/15/2022:  90% using the spoon, assit at time with the forearm motion, and grip on the utensils. 100% for finger foods-independent with set-up- unsupervised.  07/20/22: self-feeds cereal using spoon 90% of task. 05/25/2022: Pt. Is able to use a spoon to scoop cereal when feeding himself cereal 85% of the time. Pt. Is able to feed himself snack/finger foods 100% of the time. Pt. Continues to work on consistency of  stabilizing a cup/mug when drinking. Pt. Is able to grasp a water  bottle with assist initially, with assist tapering off as he drinks.03/23/2022: Pt. Is able to perform scooping cereal for 75% of the time. Pt. required assist, and support at the left elbow, and Pt. Presents with limited forearm supination when using the spoon, and bringing it towards his mouth. Pt. Is able to use a fork to spear items, and perform the hand to mouth pattern.  03/21/2022: Pt. Is able to perform scooping cereal for 75% of the time. Pt. required assist, and support at the left elbow, and Pt. presents with limited forearm supination when using the spoon, and bringing it towards his  mouth. Pt. Is able to use a fork to spear items, and perform the hand to mouth  pattern.  Eval: Pt. is able to hold standard standard utensils. Pt. Performs as much of the task as he, can and has assistance for the remainder. Goal status:  Improved, Achieved  4.  Pt. will improve grasp patterns and consistently grasp 1/4 objects for ADL, and IADL tasks.  Baseline: 08/27/23: Pt. is able to grasp flat discs, and sustain a secure hold on them while moving the UE through various planes. 08/07/23: Continue 05/31/2023: Continue 05/10/23: Pt. is consistently able to grasp 1/2 objects with visual cues. Pt. Requires assist, and increased visual cues to grasp 1/4 objects on an elevated plane.  Pt. Is unable to grasp the flat objects at the tabletop surface. Pt. Is grasping grasping small puppy vitamins.03/08/23: 1/2 objects efficiently; 02/13/2023: 1/2 objects efficiently 01/09/2023: Continue 12/28/2022: Continues to grasp 1/4 pegs with min a + visual cues, consistently grasping 1/2 objects with visual cues 11/16/2022: Continues to grasp 1/4 pegs with min a + visual cues, consistently grasping 1/2 objects with visual cues 09/26/2022: Continues to grasp 1/4 pegs with min a + visual cues, consistently grasping 1/2 objects with visual cues. 08/15/2022: grasps 1/4 pegs with min a + visual cues, consistently grasping 1/2 objects with visual cues. 07/20/22: grasps 1/4 pegs with min a + visual cues, consistently grasping 1/2 objects with visual cues. 05/25/2022: Pt. Is working on improving consistency of grasping 1/2 objects with visual cues.  03/23/2022: Pt. Is able to grasp 1 objects consistently,and continues to work on the hand patterns needed to grasp 1/2 objects.03/21/2022: Pt. Is able to grasp 1 objects consistently,and continues to work on the hand patterns needed to grasp 1/2 objects. Eval: Pt. is able to grasp 1 objects intermittently using a lateral grasp pattern. Goal status: Ongoing  5.  Pt. will independently write his name legibly with letter sizes under 1. Baseline: 05/10/23:  Deferred 03/08/23: Not addressed this period; 02/13/2023: Continue 01/09/2023: Continue. 12/28/2022: Pt. is now using an IPad Pen. 11/16/2022: Pt. Continues to be able to write name with smaller letter size. Pt. Is signing name with a computer styus. Pt. Requires visual cues, and assist 09/26/2022: Pt. Continues to be able to write name with smaller letter size. Pt. Is signing name with a computer styus. Pt. Requires visual cues, and assist. 08/15/2022: Pt. Is able to write name with smaller letter size. Pt. Is signing name with a computer styus. Pt. Requires visual cues, and assist. 07/20/22: stabilizing assist to write name with 75% legibility with 2 letters. 05/25/2022: Pt. to continue to work towards formulating grasp patterns in preparation for grasping a large width pen. Pt. Requires visual cues. 03/23/2022: Pt. Is able to write his name with modA, however has difficulty with formulating letter sizes less than 2 in size with 50% legibility for the 3 letters of his name.03/21/2022: Pt. Is able to write his name with modA, however has difficulty with formulating letter sizes less than 2 in size with 50% legibility for the 3 letters of his name. Eval: Pt is able to hold a thin marker with his left hand, and formulate a line, and initiate a circular pattern (Pt. without glasses today) Goal status: Deferred  6. Pt. Will reach up to comb/brush his hair with minA.  Baseline: 05/10/23: Pt. is able to use a pick independently with the left hand with occasional assist to the far side of the left his head. 03/08/23: Pt uses pick  in L hand to reach 75% of his head; unable to reach R side of head; 02/13/2023:Pt. is able to use a hair pick with the left hand on the right side of his head, top, and back of the head. Pt. Continues to have difficulty reaching further to the right side of his head with the left. 01/09/2023: Continue 12/28/2022: Pt. Is progressing, and is now able to use a hair pick with the left hand on the right  side of his head, top, and back of the head. Pt. Continues to have difficulty reaching further to the right side of his head with the left. 11/16/2022: Pt. Is able to use a hair pick for the left side of his head using his left hand. Pt. Presents with difficulty reaching to the right side of his head.09/26/2022: Pt. Is able to use a hair pick for the left side of his head using his left hand. Pt. Presents with difficulty reaching to the right side of his head. 5/13/202: 75% using a hair pick, assist to the right side of the head. 07/20/22: reaches 75% of head, assist for far R side of head, fatigues quickly. 05/25/2022: Pt. Is able to reach up with the left hand to the left side, top, and back of his head. 03/23/2022: Pt. is now able to more consistently initiate reaching up to his head with his left hand in preparation for haircare12/18/2023: Pt. is now able to more consistently initiate reaching up to his head with his left hand in preparation for haircare. Eval: Pt. is able to initiate reaching up for hair care with a long handled brush, however is unable to sustain UE's in elevation to perform the task.     Goal status: Achieved  7. Pt. Will independently navigate the w/c through his environment with minA with visual scanning, and hand placement on the controls.  Baseline: 11/16/2022: Deferred 2/2 vision 09/26/2022: Pt. Is now navigating his w/c ouside the home, and around his block. 08/15/2022: Pt. Requires minA to setup hand on controls and MIN cues to navigate the w/c in wide spaces, requires MIN - MOD cues to navigate the w/c through more narrow doorways, and tighter turns - varies based on good vs bad vision days.07/20/22: Pt. Requires minA to setup hand on controls and MIN cues to navigate the w/c in wide spaces, requires MIN - MOD cues to navigate the w/c through more narrow doorways, and tighter turns - varies based on good vs bad vision days. 05/25/2022: Pt. Requires minA  and  cues to navigate the w/c in  wide spaces, and requires Mod cues to navigate the w/c through more narrow doorways, and tighter turns. Pt. Requires max cues for scanning through the environment, and moderate cues for hand placement on the controls.     Goal status: Deferred 2/2 vision    8. Pt. Will improve bilateral grip strength to be able to independently grasp, and pull up blankets, and linens.   Baseline: 05/10/23: Deferred 03/08/23: R grip 0-5 lbs, L grip 8 lbs; pt reports he can consistently pull up blankets and linens independently.  02/13/2023: Continue. Pt. presents with digit PIP, and DIP flexor tightness. 01/09/2023: Continue 12/28/2023: R: 0#, L: 5# Pt. Is now able to more consistently grasp and pull blankets up over him. 11/16/2022: R: 0 L: 8# Pt. Is more consistently grasping his blankets and attempting to pull them up. 09/26/2022: R: 0  L: 8# Pt. is attempting to grasp, and pull blankets up more, using  mostly the left hand. 08/15/2022: Pt. has difficulty securely holding, and pulling up blankets, and linens.    Goal status:Deferred  9. Pt. will consistently actively control the releasing of blankets, covers, and linens from his hands once they are in the desired position over him. Baseline:12/28/2022: Pt. Is consistently actively able to release linens once they are in the desired position over him. 11/16/2022: Pt. continues to improve with actively releasing blankets form his hands. 09/26/2022: Pt. is improving with actively releasing blankets form his hands. 08/15/2022: Pt. has difficulty with controlled releasing of blankets/linens from his grasp.     Goal status: Achieved    10. Pt. Will improve bilateral UE strength by 2 MM grades to assist with ADLs, and IADLs  Baseline: 08/27/23:  Right: shoulder flexion: 3+/5, abduction: 4-/5, elbow flexion: 4+/5 , extension: 4-/5 wrist extension: 2/5; Left: shoulder flexion: 4+/5, abduction: 4+/5, elbow flexion: 5/5 , extension: 4/5  wrist extension: 3/5  08/07/23: Continue 05/31/2023:  Continue 05/10/23: Right: shoulder flexion: 3+/5, abduction: 4-/5, elbow flexion: 4+/5 , extension: 4-/5 wrist extension: 2/5; Left: shoulder flexion: 4+/5, abduction: 4+/5, elbow flexion: 5/5 , extension: 4/5  wrist extension: 3/5 03/13/2023: Right: shoulder flexion: 3/5, abduction: 4/5, elbow flexion: 4+/5 , extension: 4-/5 wrist extension: 2/5; Left: shoulder flexion: 4+/5, abduction: 4+/5, elbow flexion: 5/5 , extension: 4/5  wrist extension: 3/5 03/08/23: NT this date d/t as pt arrived late; 02/13/2023: Continue 01/09/2023: Continue 12/28/2022: Right: shoulder flexion: 3/5, abduction: 4/5, elbow flexion: 4+/5 , extension: 4-/5 wrist extension: 2/5; Left: shoulder flexion: 4+/5, abduction: 4+/5, elbow flexion: 5/5 , extension: 4/5  wrist extension: 3/5 Right: shoulder flexion: 3+/5, abduction: 4/5, elbow flexion: 4+/5 , extension: 4+/5 wrist extension: 2/5; Left: shoulder flexion: 4+/5, abduction: 4+/5, elbow flexion: 5/5 , extension: 4+/5 wrist extension: 3-/5 11/16/2022: Right: shoulder flexion: 3/5, abduction: 4/5, elbow flexion: 4+/5 , extension: 4+/5 wrist extension: 2/5; Left: shoulder flexion: 4+/5, abduction: 4+/5, elbow flexion: 5/5 , extension:  wrist extension: 3-/5 Right: shoulder flexion: 3+/5, abduction: 4/5, elbow flexion: 4+/5 , extension: 4+/5 wrist extension: 2/5; Left: shoulder flexion: 4+/5, abduction: 4+/5, elbow flexion: 5/5 , extension: 4+/5 wrist extension: 3-/5 Goal status: Ongoing  11. Pt. will improve right wrist extension by 10 degrees to initiate reaching for items in preparation for ADLs. Baseline: 08/23/2023: R Wrist Ext: -31(-8)  08/07/23: Continue 05/31/23: Continue 05/10/23: Right wrist extension: -32(22) 03/13/23: -22(35) 03/08/23: NT this date as pt arrived late; 02/13/2023: Continue 01/09/2023: Continue 12/28/2022: -22(34) 11/16/2022: -30(10)    Goal status: Ongoing      12. Pt. Will require donn/doff a Tank top T-shirt efficiently with supervision and full  set-up.    Baseline:08/23/23: Pt. Is able to independently doff his shirt, requiring assist to lean forward and loosen the back. 08/07/23: Continue 05/31/2023: Pt. Requires maxA donning a pullover sweatshirt, Mod-maxA doffing it. T-shirt tank TBD 05/10/23: Pt. Presents with difficulty completing, and requires increased time to complete    Goal status: Ongoing    13. Pt. Will independently perform bilateral/bimanual hand tasks needed to fold towels/linens/laundry Baseline:08/23/23: Pt. Continues to have difficulty using bilateral/bimanual hand tasks needed to fold towels/linens/laundry. 08/07/23: Continue 05/31/23: Continue 05/10/23:  Pt. Has difficulty folding towels/lines/laundry using bilateral hands Goal status:Ongoing  14. Pt. Will independently, and efficiently reach his right arm over the side of the chair to pet his dog.    Baseline: 08/23/2023: Pt. Is now able to reach over the side of the armrest of his recliner chair to pet his dog.  08/07/23:  Continue 05/31/2023: Continue 05/10/23: Pt. is unable to efficiently reach his right UE over the side of his recliner chair to pet his dog.    Goal status: Achieved    15. Pt. Will independently, and efficiently reach out in front of him to efficiently place items onto his table while sitting in his recliner.     Baseline:  08/23/2023: Pt. has difficulty consistently reaching out in front of him to place items on the on a tabletop surface with the right hand. 08/07/23: Continue 2/26/225: Continue 05/10/23: Pt. has difficulty reaching out in front of him far enough to efficiently place items on the table while sitting in a recliner chair.    Goal status: Ongoing   ASSESSMENT:  CLINICAL IMPRESSION:    Pt. tolerated 4.5# for the RUE, and 7.5# initially increasing to 9.5# with the left UE. Pt. required assist to support his RUE through all movements. Pt. requires cues to engage his trunk/core during the UBE task. Pt. Required consistent step-by-step cuing for the  bilateral alternating hand movements. Pt. Presented with increased consistent active elbow extension, wrist, and digit extension when reaching out in preparation grasping the handle. Pt. continues to be highly motivated, and Pt.'s caregivers continue to be very supportive to continue working towards improving BUE functioning, ROM, strength, motor control, [facility] skills, as well as visual compensatory strategies in order to maximize independence with daily ADLs, and IADL functioning.      PERFORMANCE DEFICITS in functional skills including ADLs, IADLs, coordination, dexterity, proprioception, ROM, strength, pain, FMC, GMC, decreased knowledge of use of DME, and UE functional use, cognitive skills including safety awareness, and psychosocial skills including coping strategies, environmental adaptation, habits, and routines and behaviors.   IMPAIRMENTS are limiting patient from ADLs, IADLs, leisure, and social participation.   COMORBIDITIES may have co-morbidities  that affects occupational performance. Patient will benefit from skilled OT to address above impairments and improve overall function.  MODIFICATION OR ASSISTANCE TO COMPLETE EVALUATION: Maximum or significant modification of tasks or assist is necessary to complete an evaluation.  OT OCCUPATIONAL PROFILE AND HISTORY: Comprehensive assessment: Review of records and extensive additional review of physical, cognitive, psychosocial history related to current functional performance.  CLINICAL DECISION MAKING: High - multiple treatment options, significant modification of task necessary  REHAB POTENTIAL: Fair    EVALUATION COMPLEXITY: High    PLAN: OT FREQUENCY: 2 x's a week  OT DURATION:12 weeks  PLANNED INTERVENTIONS: self care/ADL training, therapeutic exercise, therapeutic activity, neuromuscular re-education, manual therapy, passive range of motion, patient/family education, and cognitive remediation/compensation RECOMMENDED OTHER  SERVICES: PT  CONSULTED AND AGREED WITH PLAN OF CARE: Patient and family member/caregiver  PLAN FOR NEXT SESSION: Review HEP  Richardson Otter, MS, OTR/L  09/28/23

## 2023-10-02 ENCOUNTER — Ambulatory Visit: Payer: Medicare Other | Admitting: Occupational Therapy

## 2023-10-02 ENCOUNTER — Ambulatory Visit: Payer: Medicare Other

## 2023-10-04 ENCOUNTER — Ambulatory Visit: Payer: Medicare Other

## 2023-10-04 ENCOUNTER — Ambulatory Visit: Payer: Medicare Other | Admitting: Occupational Therapy

## 2023-10-09 ENCOUNTER — Ambulatory Visit: Payer: Medicare Other | Admitting: Occupational Therapy

## 2023-10-09 ENCOUNTER — Ambulatory Visit: Payer: Medicare Other

## 2023-10-11 ENCOUNTER — Ambulatory Visit: Payer: Medicare Other | Attending: Physician Assistant | Admitting: Occupational Therapy

## 2023-10-11 ENCOUNTER — Ambulatory Visit: Payer: Medicare Other

## 2023-10-11 DIAGNOSIS — M6281 Muscle weakness (generalized): Secondary | ICD-10-CM

## 2023-10-11 DIAGNOSIS — R269 Unspecified abnormalities of gait and mobility: Secondary | ICD-10-CM | POA: Insufficient documentation

## 2023-10-11 DIAGNOSIS — R262 Difficulty in walking, not elsewhere classified: Secondary | ICD-10-CM

## 2023-10-11 DIAGNOSIS — R2689 Other abnormalities of gait and mobility: Secondary | ICD-10-CM | POA: Diagnosis not present

## 2023-10-11 DIAGNOSIS — R278 Other lack of coordination: Secondary | ICD-10-CM | POA: Diagnosis not present

## 2023-10-11 DIAGNOSIS — R2681 Unsteadiness on feet: Secondary | ICD-10-CM | POA: Insufficient documentation

## 2023-10-11 DIAGNOSIS — I6930 Unspecified sequelae of cerebral infarction: Secondary | ICD-10-CM | POA: Diagnosis not present

## 2023-10-12 NOTE — Therapy (Addendum)
 . Occupational Therapy Neuro Treatment Note  Patient Name: Paul Hall MRN: [id number] DOB:Nov 23, 1995, 28 y.o., male Today's Date: 10/12/2023  PCP: Dr. Bertell REFERRING PROVIDER: Dr. Bertell    OT End of Session - 10/12/23 0840     Visit Number [id number]    Number of Visits 156    Date for OT Re-Evaluation 10/30/23    OT Start Time 1535   OT Stop Time 1615    OT Time Calculation (min) 40 min    Activity Tolerance Patient tolerated treatment well    Behavior During Therapy WFL for tasks assessed/performed                      Past Medical History:  Diagnosis Date   Diabetes mellitus (HCC)    Hypertension    Stroke [facility])    Past Surgical History:  Procedure Laterality Date   IVC FILTER PLACEMENT ([facility] HX)     LEG SURGERY     PEG TUBE PLACEMENT     TRACHEOSTOMY     Patient Active Problem List   Diagnosis Date Noted   Sepsis due to vancomycin  resistant Enterococcus species (HCC) 06/06/2019   SIRS (systemic inflammatory response syndrome) (HCC) 06/05/2019   Acute lower UTI 06/05/2019   VRE (vancomycin -resistant Enterococci) infection 06/05/2019   Anemia 06/05/2019   Skin ulcer of sacrum with necrosis of muscle (HCC)    Urinary retention    Type 2 diabetes mellitus without complication, with long-term current use of insulin  (HCC)    Tachycardia    Lower extremity edema    Acute metabolic encephalopathy    Obstructive sleep apnea    Morbid obesity with BMI of 60.0-69.9, adult (HCC)    Goals of care, counseling/discussion    Palliative care encounter    Sepsis (HCC) 04/27/2019   H/O insulin  dependent diabetes mellitus 04/27/2019   History of CVA with residual deficit 04/27/2019   Seizure disorder (HCC) 04/27/2019   Decubitus ulcer of sacral region, stage 4 (HCC) 04/27/2019   ONSET DATE: 01/2019  REFERRING DIAG: CVA/COVID-19  THERAPY DIAG:  Muscle weakness (generalized)  Other lack of coordination  Rationale for Evaluation and Treatment  Rehabilitation  SUBJECTIVE:   SUBJECTIVE STATEMENT:  Pt. Was unable to attend therapy on Maonday. Pt.'s mother reports having had a painful Migraine.  Pt accompanied by: self and family member  PERTINENT HISTORY:  Pt. is a 28 y.o. male who was diagnosed with COVID-19, and CVA with resultant quadriplegia. Pt. Was then hospitalized with VRE UTI. PMHx includes: urinary retention, seizure disorder, obstructive sleep disorder, DM Type II, Morbid obesity.   PRECAUTIONS: None  WEIGHT BEARING RESTRICTIONS No  PAIN:  No reports of pain Are you having pain? no  FALLS: Has patient fallen in last 6 months? No  LIVING ENVIRONMENT: Lives with: lives with their family Lives in: House/apartment Stairs: No Level Entry Has following equipment at home: Wheelchair (power) and hoyer lift, sit to stand lift  PLOF: Independent  PATIENT GOALS: To be able to engage in more daily care tasks.  OBJECTIVE:   HAND DOMINANCE: Right  ADLs:  Transfers/ambulation related to ADLs: Eating: Pt. reports being able to hold standard utensils, and is starting to engage more in self-feeding tasks, hand to mouth patterns. Pt. Reports that he does as much as he can with the task, and family assists  with the remainder of the task. Grooming: Pt. Is able to initiate holding an electric toothbrush, and brush his teeth. Family assists LB  Dressing: Total Assist UE dressing: Pt. is now able to reach up to actively assist with grasping , and pulling his gown down. Toileting:  Total Assist Bathing: MaxA UB, Total assist LB Tub Shower transfers: N/A Equipment: See above    IADLs: Shopping: Relies on family to assist Light housekeeping: Total Assist Meal Prep: Total Assist Community mobility:   Medication management:  Total Assist  Financial management: N/A Handwriting: Not legible: Pt. Is able to hold a pen with the left hand, and initiate marking the page. Pt.'s eye glasses were not available  MOBILITY STATUS:   Power w/c  POSTURE COMMENTS:  Pt. Requires position changes in his power w/c  ACTIVITY TOLERANCE: Activity tolerance:  Fair  FUNCTIONAL OUTCOME MEASURES: FOTO: 40 TR score: 45  05/25/2022:   FOTO: 50 TR score: 45  07/20/22: FOTO 55  03/08/23: 51  UPPER EXTREMITY ROM     Active ROM Left eval Left  03/21/2022 Left 05/25/2022 L  07/20/22 Left 07/25/2022 Left  08/15/2022 Left  09/26/2022 Left  11/16/2022 Left 12/28/2022 Left 03/13/23 Left 05/10/23  Shoulder flexion 119 123 125 130  136 138 130 142 144 144  Shoulder abduction 110 115 115 115  118 121 125 142 140 140  Shoulder adduction             Shoulder extension             Shoulder internal rotation             Shoulder external rotation             Elbow flexion 135(135) 145 145 145  145    145 145  Elbow extension -27(-18) -21(-20) -20(-16)  -25(-10) -23(-10) -21(-10) -20(-18) -20(-18) -20(-17) -17(-16)  Wrist flexion             Wrist extension 10(50) 19(50) 28(50)  30(50) 35(55) 36(55) 36(60) 40(60) 45(60) 45(60)  Wrist ulnar deviation             Wrist radial deviation             Wrist pronation             Wrist supination   Limited by flexor tone       10(15)   (Blank rows = not tested)    Active ROM Right eval Right 03/21/2022 Right 05/25/2022 R  07/20/22 Right  07/25/22 Right 08/15/2022 Right 09/26/2022 Right 11/16/2022 Right 12/28/2022 Right 03/13/23 Right 05/10/23  Shoulder flexion 106 scaption 105  Scaption 62 flexion 110 scaption 100  105 105 105 112 Scaption 123 Scaption 122  Scaption  Shoulder abduction 114 90 97 100  105 117 120 124 124 122  Shoulder adduction             Shoulder extension             Shoulder internal rotation             Shoulder external rotation             Elbow flexion 120(130) 145 145 140  144 140 142 148 148 145  Elbow extension -45(-35) -22(-35) -28(-22)  -32(-21) -32(-28) -28(-26) -28(-28) -27(-26) -27(-40) -38(-35)  Wrist flexion             Wrist extension -30(10)  -25(10) -10(30)  -10(20) -10(25) -20(40) -30(10) -22(34) -22(35) -32(22)  Wrist ulnar deviation             Wrist radial deviation  Wrist pronation             Wrist supination   Limited by flexor tone            12/28/2022: Thumb radial abduction: Right: 12(46), Left: 40(54)  Digits: Bilateral 2nd through 5th digit: MP Hyperextension with PIP, an DIP flexion     UPPER EXTREMITY MMT:     Left eval Left 03/21/2022 Left  05/25/2022 L 07/20/22 Left  08/15/2022 Left 09/26/2022 Left 11/16/2022 Left 12/28/2022 Left  03/13/2023 Left  05/10/23  Shoulder flexion 3-/5 3/5 3+/5 4+/5 4+/5 4+/5 4+/5 4+/5 4+/5 4+/5  Shoulder abduction 3-/5 3-/5 4-/5 4+/5 4+/5 4+5 4+/5 4+/5 4+/5 4+/5  Shoulder adduction            Shoulder extension            Shoulder internal rotation            Shoulder external rotation            Elbow flexion 3/5 3+/5 4-/5 4+/5 5/5 5/5 5/5 5/5 5/5 5/5  Elbow extension 2-/5 2/5  4+/5 4+/5 4+/5  4/5 4/5 4/5 through available range  Wrist flexion            Wrist extension 2/5 2+/5 3-/5  3-/5 3-/5 3-/5 3/5 3/5 3/5  Wrist ulnar deviation            Wrist radial deviation            Wrist pronation            Wrist supination            (Blank rows = not tested)  Right eval Right 03/21/2022 Right  05/25/2022 R 07/20/22 Right 08/15/2022 Right 09/26/2022 Right 11/16/2022 Right 12/28/2022 Right 03/13/2023 Right 05/10/23  Shoulder flexion 3-/5 scaption 3-/5 3-/5 3+/5 3+/5 3+/5 3/5 3/5 3/5 3+/5  Shoulder abduction 3-/5 3-/5 3-/5 4/5 4/5 4/5 4/5 4/5 4/5 4/-5  Shoulder adduction            Shoulder extension            Shoulder internal rotation            Shoulder external rotation            Elbow flexion 3/5 3/5 3+/5 4/5 4+/5 4+/5 4+/5 4+/5 4+/5 4+/5  Elbow extension 2-/5 2/5 3-/5 4+/5 4+/5 4+/5 4+/5 4-/5 4-/5 4-/5  Wrist flexion            Wrist extension 2-/5 2-/5 2/5 2/5 2/5 2/5 2/5 2/5 2/5 2/5  Wrist ulnar deviation            Wrist radial deviation             Wrist pronation            Wrist supination              HAND FUNCTION:  Grip strength: Right: 0 lbs; Left: 0 lbs and Lateral pinch: Right: 5 lbs, Left: 2 lbs  03/21/2022: Lateral pinch: Right: 3.5 lbs, Left: 2 lbs  07/20/22: Grip strength: Right: 3 lbs; Left: 4 lbs and Lateral pinch: Right: 5 lbs, Left: 3 lbs  08/15/2022:  Grip strength: Right: 0 lbs; Left: 5 lbs and Lateral pinch: Right: 2 lbs, Left: 4 lbs  09/26/2022  Grip strength: Right: 0 lbs; Left: 8 lbs and Lateral pinch: Right: 2 lbs, Left: 5 lbs  11/16/2022  Grip strength: Right: 0 lbs; Left: 8 lbs  9/25/20204  Grip strength: Right: 0 lbs; Left: 8 lbs  Grip strength: Right: 0 lbs; Left: 8 lbs   03/08/23:  Grip strength: Right: 0 lbs without PROM, 5 lbs after PROM, Left 8 lbs  05/10/23:  Grip strength: TBD  Bilateral digit PIP/DIP flexion contractures with MP hyperextension with attempts for AROM. Pt. is able to tolerate AROM to the bilateral digits at the initial evaluation however, has a history of pain in the digits.  COORDINATION: Eval: Pt. is unable to grasp 9-hole test pegs. Pt. is able to initiate grasping larger pegs, and is able to hold a pen in the left hand.  07/20/22: 2 min 36 seconds to remove 9 pegs from 9 hole peg test - cues to locate pegs 2/2 low vision. Pt. is able to initiate grasping larger pegs on R hand and is able to hold a pen in the left hand.  08/15/2022:    Vision, and sensation limiting accuracy of 9 hole peg test results. Pt. Was able to grasp and remove vertical pegs intermittently with cues.    05/10/23: TBD  SENSATION: Light touch: Impaired   EDEMA:  N/A  MUSCLE TONE: BUE flexor Spasticity  COGNITION Overall cognitive status: Continue to assess in functional context  VISION:   Subjective report: Pt. was not wearing glasses at the time of the initial eval.  Baseline vision: Vision is very limited. Wears glasses all the time Visual history: History of impaired vision  following CVA. Pt. Has received treatment through the [facility] low vision rehabilitation program.   VISION ASSESSMENT: Impaired To be further assessed in functional context  PERCEPTION: Impaired   PRAXIS: Impaired: motor planning  OBSERVATIONS:  Pt reports being on Tramadol   TODAY'S TREATMENT 10/11/23   Therapeutic Activities:  -Assessed bilateral UE wrist, and hand fit for the [employer] system. -Assisted the Pt. with repositioning of the BUEs through controlled PROM/AAROM while performing virtual powerlifting, music pace symphony conducting and guitar, and cosmic tennis. -Support, assist, and encourage were provided ongoing as needed throughout the session.      PATIENT EDUCATION: Education details: PROM/AAROM using the [employer] System Person educated: Patient and Parent Education method: Explanation Education comprehension: verbalized understanding  HOME EXERCISE PROGRAM:   Bilateral hand position in the [employer]   GOALS: Goals reviewed with patient? Yes  SHORT TERM GOALS: Target date: 09/18/2023     To assess splint fit, and make appropriate adjustments to promote good skin integrity through the palmar surface of the bilateral hands.  Baseline: 05/25/22: Goal currently met, however ongoing as needs to assess splint fit arise. 03/23/2022: Pt. is wearing splints a couple of hours at night bilateral resting hand splints. 03/21/2022: Pt. is wearing splints a couple of hours at night bilateral resting hand splints. Goal status: Deferred   LONG TERM GOALS: Target date:10/30/2023      FOTO score will Improve by 2 points for Pt. perceived improvement with the assessment specific ADL/IADL tasks.  Baseline:  03/08/23: 51; 12/28/2022: FOTO score: 56; 11/16/2022: 52 09/27/2022: 51 07/20/2022: FOTO 55 05/25/2022: FOTO score: 50,  TR score: 45 Eval: FOTO score: 40,  TR score: 45 Goal status: Achieved   2.   Pt. will independently perform oral care for 100% of the task after complete  set-up. Baseline: 05/10/23: Pt. Performs 90% of the task. Pt.'s mother assists with 10% of the task for thorough cleaning the hard to reach places way in the back of the oral cavity.03/08/23: not as much assist required proximally, but to adjust positioning  of toothbrush; 02/13/23: Pt. Continues to require assist proximally 01/09/2023: Continue 12/28/2022:  Pt. Continues to complete 90% of the task with complete set-up, with visual cues, and occassional assist of the right elbow. 11/16/2022: Pt. Completes 90% of the task with complete set-up. 09/26/2022: Completes 90% of the task, proximal assist at times required at the elbow.  08/15/2022: Completes 90% of the task, proximal assist at times required at the elbow. 07/20/22: completes 90% of task, limited by shoulder flexion. 05/25/2022:  Pt. Is able to initiate and perform oral care for approximately 90% of the task. Complete set-up required. Assi needed only for the very back teeth. 03/23/2022: Pt. Is able to initiate and perform oral care for approximately 75% of the task. Pt. Requires assist at proximally at the elbow for through oral care. 03/21/2022: Pt. Is able to initiate and perform oral care for approximately 75% of the task. Pt. Requires assist at proximally at the elbow for through oral care. Eval: Pt. is able to initiate using an electric toothbrush. Pt. requires assist for set-up, and assist for thoroughness, and as he Pt. fatigues. Goal status:  Partially met, Goal discontinued  3.  Pt. Will be modified independence with self-feeding for 100% using a swivel spoon, and standard fork after complete set-up Baseline: 05/10/2023: Pt. feeds self 95-100% of the meal. with occasional assist to reposition the utensil. 03/08/23: indep with cup with a handle and lid, assist to reposition eating utensils in hand;  02/13/23:  Pt. Is now able to consistently, and efficiently use a swivel spoon for eating cereal with the left hand.01/09/2023: Continue 12/28/2022: Pt. Is  now feeding himself 90% of the time using a swivel spoon with the left hand, and 95% of the time with the fork. Pt. Continues to have difficulty with cutting food. 11/16/2022: 100% with finger foods, Pt. Is initiating with a swivel spoon, and universal cuff for fork use, however requires assist for consistency, and accuracy.  09/26/2022: 90% using the spoon, assist at time with the forearm motion, and grip on the utensils. 100% for finger foods.  08/15/2022:  90% using the spoon, assit at time with the forearm motion, and grip on the utensils. 100% for finger foods-independent with set-up- unsupervised.  07/20/22: self-feeds cereal using spoon 90% of task. 05/25/2022: Pt. Is able to use a spoon to scoop cereal when feeding himself cereal 85% of the time. Pt. Is able to feed himself snack/finger foods 100% of the time. Pt. Continues to work on consistency of  stabilizing a cup/mug when drinking. Pt. Is able to grasp a water  bottle with assist initially, with assist tapering off as he drinks.03/23/2022: Pt. Is able to perform scooping cereal for 75% of the time. Pt. required assist, and support at the left elbow, and Pt. Presents with limited forearm supination when using the spoon, and bringing it towards his mouth. Pt. Is able to use a fork to spear items, and perform the hand to mouth pattern.  03/21/2022: Pt. Is able to perform scooping cereal for 75% of the time. Pt. required assist, and support at the left elbow, and Pt. presents with limited forearm supination when using the spoon, and bringing it towards his mouth. Pt. Is able to use a fork to spear items, and perform the hand to mouth pattern.  Eval: Pt. is able to hold standard standard utensils. Pt. Performs as much of the task as he, can and has assistance for the remainder. Goal status:  Improved, Achieved  4.  Pt.  will improve grasp patterns and consistently grasp 1/4 objects for ADL, and IADL tasks.  Baseline: 08/27/23: Pt. is able to grasp flat discs,  and sustain a secure hold on them while moving the UE through various planes. 08/07/23: Continue 05/31/2023: Continue 05/10/23: Pt. is consistently able to grasp 1/2 objects with visual cues. Pt. Requires assist, and increased visual cues to grasp 1/4 objects on an elevated plane.  Pt. Is unable to grasp the flat objects at the tabletop surface. Pt. Is grasping grasping small puppy vitamins.03/08/23: 1/2 objects efficiently; 02/13/2023: 1/2 objects efficiently 01/09/2023: Continue 12/28/2022: Continues to grasp 1/4 pegs with min a + visual cues, consistently grasping 1/2 objects with visual cues 11/16/2022: Continues to grasp 1/4 pegs with min a + visual cues, consistently grasping 1/2 objects with visual cues 09/26/2022: Continues to grasp 1/4 pegs with min a + visual cues, consistently grasping 1/2 objects with visual cues. 08/15/2022: grasps 1/4 pegs with min a + visual cues, consistently grasping 1/2 objects with visual cues. 07/20/22: grasps 1/4 pegs with min a + visual cues, consistently grasping 1/2 objects with visual cues. 05/25/2022: Pt. Is working on improving consistency of grasping 1/2 objects with visual cues.  03/23/2022: Pt. Is able to grasp 1 objects consistently,and continues to work on the hand patterns needed to grasp 1/2 objects.03/21/2022: Pt. Is able to grasp 1 objects consistently,and continues to work on the hand patterns needed to grasp 1/2 objects. Eval: Pt. is able to grasp 1 objects intermittently using a lateral grasp pattern. Goal status: Ongoing  5.  Pt. will independently write his name legibly with letter sizes under 1. Baseline: 05/10/23: Deferred 03/08/23: Not addressed this period; 02/13/2023: Continue 01/09/2023: Continue. 12/28/2022: Pt. is now using an IPad Pen. 11/16/2022: Pt. Continues to be able to write name with smaller letter size. Pt. Is signing name with a computer styus. Pt. Requires visual cues, and assist 09/26/2022: Pt. Continues to be able to write name  with smaller letter size. Pt. Is signing name with a computer styus. Pt. Requires visual cues, and assist. 08/15/2022: Pt. Is able to write name with smaller letter size. Pt. Is signing name with a computer styus. Pt. Requires visual cues, and assist. 07/20/22: stabilizing assist to write name with 75% legibility with 2 letters. 05/25/2022: Pt. to continue to work towards formulating grasp patterns in preparation for grasping a large width pen. Pt. Requires visual cues. 03/23/2022: Pt. Is able to write his name with modA, however has difficulty with formulating letter sizes less than 2 in size with 50% legibility for the 3 letters of his name.03/21/2022: Pt. Is able to write his name with modA, however has difficulty with formulating letter sizes less than 2 in size with 50% legibility for the 3 letters of his name. Eval: Pt is able to hold a thin marker with his left hand, and formulate a line, and initiate a circular pattern (Pt. without glasses today) Goal status: Deferred  6. Pt. Will reach up to comb/brush his hair with minA.  Baseline: 05/10/23: Pt. is able to use a pick independently with the left hand with occasional assist to the far side of the left his head. 03/08/23: Pt uses pick in L hand to reach 75% of his head; unable to reach R side of head; 02/13/2023:Pt. is able to use a hair pick with the left hand on the right side of his head, top, and back of the head. Pt. Continues to have difficulty reaching further to the right side of  his head with the left. 01/09/2023: Continue 12/28/2022: Pt. Is progressing, and is now able to use a hair pick with the left hand on the right side of his head, top, and back of the head. Pt. Continues to have difficulty reaching further to the right side of his head with the left. 11/16/2022: Pt. Is able to use a hair pick for the left side of his head using his left hand. Pt. Presents with difficulty reaching to the right side of his head.09/26/2022: Pt. Is able to use a  hair pick for the left side of his head using his left hand. Pt. Presents with difficulty reaching to the right side of his head. 5/13/202: 75% using a hair pick, assist to the right side of the head. 07/20/22: reaches 75% of head, assist for far R side of head, fatigues quickly. 05/25/2022: Pt. Is able to reach up with the left hand to the left side, top, and back of his head. 03/23/2022: Pt. is now able to more consistently initiate reaching up to his head with his left hand in preparation for haircare12/18/2023: Pt. is now able to more consistently initiate reaching up to his head with his left hand in preparation for haircare. Eval: Pt. is able to initiate reaching up for hair care with a long handled brush, however is unable to sustain UE's in elevation to perform the task.     Goal status: Achieved  7. Pt. Will independently navigate the w/c through his environment with minA with visual scanning, and hand placement on the controls.  Baseline: 11/16/2022: Deferred 2/2 vision 09/26/2022: Pt. Is now navigating his w/c ouside the home, and around his block. 08/15/2022: Pt. Requires minA to setup hand on controls and MIN cues to navigate the w/c in wide spaces, requires MIN - MOD cues to navigate the w/c through more narrow doorways, and tighter turns - varies based on good vs bad vision days.07/20/22: Pt. Requires minA to setup hand on controls and MIN cues to navigate the w/c in wide spaces, requires MIN - MOD cues to navigate the w/c through more narrow doorways, and tighter turns - varies based on good vs bad vision days. 05/25/2022: Pt. Requires minA  and  cues to navigate the w/c in wide spaces, and requires Mod cues to navigate the w/c through more narrow doorways, and tighter turns. Pt. Requires max cues for scanning through the environment, and moderate cues for hand placement on the controls.     Goal status: Deferred 2/2 vision    8. Pt. Will improve bilateral grip strength to be able to independently  grasp, and pull up blankets, and linens.   Baseline: 05/10/23: Deferred 03/08/23: R grip 0-5 lbs, L grip 8 lbs; pt reports he can consistently pull up blankets and linens independently.  02/13/2023: Continue. Pt. presents with digit PIP, and DIP flexor tightness. 01/09/2023: Continue 12/28/2023: R: 0#, L: 5# Pt. Is now able to more consistently grasp and pull blankets up over him. 11/16/2022: R: 0 L: 8# Pt. Is more consistently grasping his blankets and attempting to pull them up. 09/26/2022: R: 0  L: 8# Pt. is attempting to grasp, and pull blankets up more, using mostly the left hand. 08/15/2022: Pt. has difficulty securely holding, and pulling up blankets, and linens.    Goal status:Deferred  9. Pt. will consistently actively control the releasing of blankets, covers, and linens from his hands once they are in the desired position over him. Baseline:12/28/2022: Pt. Is consistently actively able  to release linens once they are in the desired position over him. 11/16/2022: Pt. continues to improve with actively releasing blankets form his hands. 09/26/2022: Pt. is improving with actively releasing blankets form his hands. 08/15/2022: Pt. has difficulty with controlled releasing of blankets/linens from his grasp.     Goal status: Achieved    10. Pt. Will improve bilateral UE strength by 2 MM grades to assist with ADLs, and IADLs  Baseline: 08/27/23:  Right: shoulder flexion: 3+/5, abduction: 4-/5, elbow flexion: 4+/5 , extension: 4-/5 wrist extension: 2/5; Left: shoulder flexion: 4+/5, abduction: 4+/5, elbow flexion: 5/5 , extension: 4/5  wrist extension: 3/5  08/07/23: Continue 05/31/2023: Continue 05/10/23: Right: shoulder flexion: 3+/5, abduction: 4-/5, elbow flexion: 4+/5 , extension: 4-/5 wrist extension: 2/5; Left: shoulder flexion: 4+/5, abduction: 4+/5, elbow flexion: 5/5 , extension: 4/5  wrist extension: 3/5 03/13/2023: Right: shoulder flexion: 3/5, abduction: 4/5, elbow flexion: 4+/5 , extension: 4-/5 wrist  extension: 2/5; Left: shoulder flexion: 4+/5, abduction: 4+/5, elbow flexion: 5/5 , extension: 4/5  wrist extension: 3/5 03/08/23: NT this date d/t as pt arrived late; 02/13/2023: Continue 01/09/2023: Continue 12/28/2022: Right: shoulder flexion: 3/5, abduction: 4/5, elbow flexion: 4+/5 , extension: 4-/5 wrist extension: 2/5; Left: shoulder flexion: 4+/5, abduction: 4+/5, elbow flexion: 5/5 , extension: 4/5  wrist extension: 3/5 Right: shoulder flexion: 3+/5, abduction: 4/5, elbow flexion: 4+/5 , extension: 4+/5 wrist extension: 2/5; Left: shoulder flexion: 4+/5, abduction: 4+/5, elbow flexion: 5/5 , extension: 4+/5 wrist extension: 3-/5 11/16/2022: Right: shoulder flexion: 3/5, abduction: 4/5, elbow flexion: 4+/5 , extension: 4+/5 wrist extension: 2/5; Left: shoulder flexion: 4+/5, abduction: 4+/5, elbow flexion: 5/5 , extension:  wrist extension: 3-/5 Right: shoulder flexion: 3+/5, abduction: 4/5, elbow flexion: 4+/5 , extension: 4+/5 wrist extension: 2/5; Left: shoulder flexion: 4+/5, abduction: 4+/5, elbow flexion: 5/5 , extension: 4+/5 wrist extension: 3-/5 Goal status: Ongoing  11. Pt. will improve right wrist extension by 10 degrees to initiate reaching for items in preparation for ADLs. Baseline: 08/23/2023: R Wrist Ext: -31(-8)  08/07/23: Continue 05/31/23: Continue 05/10/23: Right wrist extension: -32(22) 03/13/23: -22(35) 03/08/23: NT this date as pt arrived late; 02/13/2023: Continue 01/09/2023: Continue 12/28/2022: -22(34) 11/16/2022: -30(10)    Goal status: Ongoing      12. Pt. Will require donn/doff a Tank top T-shirt efficiently with supervision and full set-up.    Baseline:08/23/23: Pt. Is able to independently doff his shirt, requiring assist to lean forward and loosen the back. 08/07/23: Continue 05/31/2023: Pt. Requires maxA donning a pullover sweatshirt, Mod-maxA doffing it. T-shirt tank TBD 05/10/23: Pt. Presents with difficulty completing, and requires increased time to complete    Goal status:  Ongoing    13. Pt. Will independently perform bilateral/bimanual hand tasks needed to fold towels/linens/laundry Baseline:08/23/23: Pt. Continues to have difficulty using bilateral/bimanual hand tasks needed to fold towels/linens/laundry. 08/07/23: Continue 05/31/23: Continue 05/10/23:  Pt. Has difficulty folding towels/lines/laundry using bilateral hands Goal status:Ongoing  14. Pt. Will independently, and efficiently reach his right arm over the side of the chair to pet his dog.    Baseline: 08/23/2023: Pt. Is now able to reach over the side of the armrest of his recliner chair to pet his dog.  08/07/23: Continue 05/31/2023: Continue 05/10/23: Pt. is unable to efficiently reach his right UE over the side of his recliner chair to pet his dog.    Goal status: Achieved    15. Pt. Will independently, and efficiently reach out in front of him to efficiently place items onto his table while  sitting in his recliner.     Baseline:  08/23/2023: Pt. has difficulty consistently reaching out in front of him to place items on the on a tabletop surface with the right hand. 08/07/23: Continue 2/26/225: Continue 05/10/23: Pt. has difficulty reaching out in front of him far enough to efficiently place items on the table while sitting in a recliner chair.    Goal status: Ongoing   ASSESSMENT:  CLINICAL IMPRESSION:     Pt. was able to tolerate the hand/wrist device fit using the smallest hand attachment on the [employer] hand system. Pt. required BUE support,the [employer] ramp, and assist for repositioning for proper alignment throughout the ROM for each virtual game. Pt. continues to be highly motivated. Pt.'s caregivers continue to be very supportive to continue working towards improving BUE functioning, ROM, strength, motor control, [facility] skills, as well as visual compensatory strategies in order to maximize independence with daily ADLs, and IADL functioning.     PERFORMANCE DEFICITS in functional skills including ADLs,  IADLs, coordination, dexterity, proprioception, ROM, strength, pain, FMC, GMC, decreased knowledge of use of DME, and UE functional use, cognitive skills including safety awareness, and psychosocial skills including coping strategies, environmental adaptation, habits, and routines and behaviors.   IMPAIRMENTS are limiting patient from ADLs, IADLs, leisure, and social participation.   COMORBIDITIES may have co-morbidities  that affects occupational performance. Patient will benefit from skilled OT to address above impairments and improve overall function.  MODIFICATION OR ASSISTANCE TO COMPLETE EVALUATION: Maximum or significant modification of tasks or assist is necessary to complete an evaluation.  OT OCCUPATIONAL PROFILE AND HISTORY: Comprehensive assessment: Review of records and extensive additional review of physical, cognitive, psychosocial history related to current functional performance.  CLINICAL DECISION MAKING: High - multiple treatment options, significant modification of task necessary  REHAB POTENTIAL: Fair    EVALUATION COMPLEXITY: High    PLAN: OT FREQUENCY: 2 x's a week  OT DURATION:12 weeks  PLANNED INTERVENTIONS: self care/ADL training, therapeutic exercise, therapeutic activity, neuromuscular re-education, manual therapy, passive range of motion, patient/family education, and cognitive remediation/compensation RECOMMENDED OTHER SERVICES: PT  CONSULTED AND AGREED WITH PLAN OF CARE: Patient and family member/caregiver  PLAN FOR NEXT SESSION: Review HEP  Richardson Otter, MS, OTR/L  09/28/23

## 2023-10-12 NOTE — Therapy (Signed)
 OUTPATIENT PHYSICAL THERAPY TREATMENT    Patient Name: Paul Hall MRN: [id number] DOB:1995/08/03, 28 y.o., male Today's Date: 10/12/2023  PCP:  [facility] PROVIDER:  Morene Sous, PA-C   PT End of Session - 10/11/23 1619     Visit Number 165    Number of Visits 184   date corrected from most recent recert   Date for PT Re-Evaluation 11/27/23    Authorization Type [employer]/ [place] Medicaid    Authorization Time Period 01/04/21-03/29/21; Recert 03/24/2021-06/16/2021; Recert 09/15/2021- 12/08/2021; Recert 12/13/2021-03/07/2022    Progress Note Due on Visit 170    PT Start Time 1620    PT Stop Time 1700    PT Time Calculation (min) 40 min    Equipment Utilized During Treatment Gait belt    Activity Tolerance Patient tolerated treatment well    Behavior During Therapy WFL for tasks assessed/performed                            Past Medical History:  Diagnosis Date   Diabetes mellitus (HCC)    Hypertension    Stroke (HCC)    Past Surgical History:  Procedure Laterality Date   IVC FILTER PLACEMENT ([facility] HX)     LEG SURGERY     PEG TUBE PLACEMENT     TRACHEOSTOMY     Patient Active Problem List   Diagnosis Date Noted   Sepsis due to vancomycin  resistant Enterococcus species (HCC) 06/06/2019   SIRS (systemic inflammatory response syndrome) (HCC) 06/05/2019   Acute lower UTI 06/05/2019   VRE (vancomycin -resistant Enterococci) infection 06/05/2019   Anemia 06/05/2019   Skin ulcer of sacrum with necrosis of muscle (HCC)    Urinary retention    Type 2 diabetes mellitus without complication, with long-term current use of insulin  (HCC)    Tachycardia    Lower extremity edema    Acute metabolic encephalopathy    Obstructive sleep apnea    Morbid obesity with BMI of 60.0-69.9, adult (HCC)    Goals of care, counseling/discussion    Palliative care encounter    Sepsis (HCC) 04/27/2019   H/O insulin  dependent diabetes mellitus 04/27/2019   History of  CVA with residual deficit 04/27/2019   Seizure disorder (HCC) 04/27/2019   Decubitus ulcer of sacral region, stage 4 (HCC) 04/27/2019    REFERRING DIAG: Cerebral infarction, unspecified   THERAPY DIAG:  Muscle weakness (generalized)  Other lack of coordination  Difficulty in walking, not elsewhere classified  Abnormality of gait and mobility  Unsteadiness on feet  Other abnormalities of gait and mobility  History of CVA with residual deficit  Rationale for Evaluation and Treatment Rehabilitation  PERTINENT HISTORY: Paul Hall is a 26yoM who presents with severe weakness, quadriparesis, altered sensorium, and visual impairment s/p critical illness and prolonged hospitalization. Pt hospitalized in October 2020 with ARDS 2/2 COVID19 infection. Pt sustained a complex and lengthy hospitalization which included tracheostomy, prolonged sedation, ECMO. In this period pt sustained CVA and SDH. Pt has now been liberated from tracheostomy and G-tube. Pt has since been hospitalized for wound infection and UTI. Pt lives with parents at home, has hospital bed and left chair, hoyer lift transfers, and power WC for mobility needs. Pt needs heavy physical assistance with ADL 2/2 BUE contractures and motor dysfunction   PRECAUTIONS: Fall  SUBJECTIVE:  Patient reports that he has resumed some standing at home and doing more with LE strengthening in his chair- States legs are  sore today.    PAIN:  Are you having pain? No. But feeling tight.    TODAY'S TREATMENT:  - 10/11/2023          TE:  Resistive LE strengtheing in powerwheelchair today: -seated ankle PF YTB 2 x 10 reps ea LE -Seated ham curls YTB 3 x 10 reps ea LE -Seated hip abd YTB 2 x 10 reps ea LE  -Seated knee ext YTB 2 x 10 reps ea LE -Resistive hip ext (knee bent and band around plantar foot- instructed to press down) YTB 2 x 10 reps.  Seated assisted Hip IR each LE x 20     PATIENT EDUCATION: Education details:  Exercise technique with VC   HOME EXERCISE PROGRAM:  Access Code: [email or web address] URL: [email or web address] Date: 03/23/2022 Prepared by: Reyes London  Exercises - Supine Bridge  - 3 x weekly - 3 sets - 10 reps - 2 hold - Supine Gluteal Sets  - 3 x weekly - 3 sets - 10 reps - 5 sec hold - Supine Quad Set  - 1 x daily - 3 x weekly - 3 sets - 10 reps - 5 hold - Seated Long Arc Quad  - 1 x daily - 7 x weekly - 3 sets - 10 reps - Seated Hip Adduction Squeeze with Ball  - 1 x daily - 3 x weekly - 3 sets - 10 reps - 5 hold - Seated Hip Abduction  - 1 x daily - 3 x weekly - 3 sets - 10 reps - 2 hold    PT Short Term Goals -       PT SHORT TERM GOAL #1   Title Pt will be independent with HEP in order to improve strength and balance in order to decrease fall risk and improve function at home and work.    Baseline 01/04/2021= No formal HEP in place; 12/12 no HEP in place; 05/10/2021-Patient and his father were able to report compliance with curent HEP consisting of mostly seated/reclined LE strengthening. Both verbalize no questions at this time.    Time 6    Period Weeks    Status Achieved    Target Date 02/15/21            PT LONG TERM GOAL #1     Title Patient will increase BLE gross strength by 1/2 muscle grade to improve functional strength for improved independence with potential gait, increased standing tolerance and increased ADL ability.     Baseline 01/04/2021- Patient presents with 1/5 to 3-/5 B LE strength with MMT; 12/12: goal partially met for Left knee/hip; 05/10/2021= 2-/5 bilateal Hip flex; 3+/5 bilateral Knee ext; 06/21/2021= Patient presents with 2-/5 bilateral Hip flex; 3+/5 bilateral knee ext/flex; 2-/5 left ankle DF; 0/5 right ankle- and able to increase reps and resistance with LE's. 09/15/2021- Patient technically presents with 2-/5 B hip flex/abd/add - but he is able to raise his hip up to approx 100 deg which has improved. 3+/5 Bilateral knee ext, 2-/5 left  ankle and 0/5 right ankle.  12/08/2021= Patient able to lift left knee at 110 deg of hip flex; presents with 3+/5 knee ext, 2-/5 left ankle DF and 0/5 right ankle DF, 2-/5 bilateral Hip abd in seated position.    12/6: R: knee 3+/5 ext, 2/5 flexion, left knee 3+/5 extension, 3+/5 flexion, R hip: 2+/5 hip add, 2+/5 hip ABD L hip: 4-/5 hip ABD, 3+/5 hip ADD, 3+/5 hip flexion; 06/06/2022= Patient now presents with 2-/5 right ankle  DF/PF;     Time 12     Period Weeks     Status MET    Target Date 03/07/2022         PT LONG TERM GOAL #2    Title Patient will tolerate sitting unsupported demonstrating erect sitting posture for 15 minutes with CGA to demonstrate improved back extensor strength and improved sitting tolerance.     Baseline 01/04/2021- Patient confied to sitting in lift chair or electric power chair with back support and unable to sit upright without physical assistance; 12/12: tolerates <1 minutes upright unsupported sitting. 05/10/2021=static sit with forward trunk lean  in his power wheelchair without back support x approx 3 min. 06/21/2021=Unable to assess today due to patient with acute back pain but on previous visit able to sit x 8 min without back support. 09/15/2021- on last visit- 09/13/2021- patient was able to sit unsupported x 8 min at edge of mat. 10/13/2023 - Patient was able to sit at edge of mat with varying level of assist today from SBA to min A for a total of 20 min. 12/13/2021= Patient demonstrated unsupported sitting at edge of mat for approx 20 min    Time 12     Period Weeks     Status GOAL MET    Target Date 12/08/21          PT LONG TERM GOAL #3    Title Patient will demonstrate ability to perform static standing in // bars > 2 min with Max Assist  without loss of balance and fair posture for improved overall strength for pre-gait and transfer activities.     Baseline 01/04/2021= Patient current uanble to stand- Dependent on hoyer or sit to stand lift for transfers. 05/10/2021=Not  appropriate yet- Currently still dependent with all transfers using hoyer. 06/21/2021= Patient continuing now to focus on LE strengthening to prepare for standing-unable to try today due to acute low back pain-  planning on attempting in new cert period. 09/15/2021- Patient has attempted standing 2x in past two week- max Assist of 2 people - only once was he successful to clearing his bottom from chair - Will continue to be a focus during the new certification. 12/13/2021= Patient has been limited secondary to increased overall low back pain during this certification and will require more time to focus on this goal.  12/6: not assessed this date, will assess at date when 2-3 PTs are present for assistance     Time 12     Period Weeks     Status GOAL not appropriate at this time - may attempt in future once Patient presents with improved overall LE strength.                 PT LONG TERM GOAL #4    Title Pt will improve FOTO score by 10 points or more demonstrating improved perceived functional ability     Baseline FOTO 7 on 10/17; 03/15/21: FOTO 12; 05/10/2021 06/21/2021= 1; 09/15/2021= 9; 12/13/2021= Will issue next visit 12/6: 4; 06/06/2022= Will assess next visit; Goal not appropriate as patient is not ambulatory.     Time 12     Period Weeks     Status WITHDRAWN    Target date 11/30/2022         PT LONG TERM GOAL #5    Title Patient will perform sit to stand transfer with appropriate AD and max assist of 2 people with 75% consistency to prepare for pregait activities.  Baseline 09/15/2021= Patient unable to stand well- unable to clear his bottom off chair with Max assist of 2 persons. 12/13/2021- Goal not appropriate to try yet but will keep and roll over to next cert as shift continues to focus on transfers/standing; 4/24= Patient able to perform active ankle DF/PF with right LE and able to raise his knee into seated march and clear floor without physical assist today - as previously unable as well as  lift right knee ext to near full ROM to improve strength for eventual standing. 09/07/2022- Goal continues to not be appropriate but patient is now standing some and will keep goal active; 11/23/2022= patient is now performing sit to stand with max A +2 and now able to clear his bottom off mat. 11/30/2022- Patient continues to perform sit to stand transfers with Max A+2 and able to clear bottom well off mat but not erect yet. 05/31/2023- Patient is able to stand consistently with max A +2 - unable to achieve full knee or trunk ext yet is able to clear bottom off mat for improved LE weightbearing. 09/04/2023- Patient performed several Sit to stand requiring max A+2  with improving posture- able to ext R knee better - near terminal knee and able to clear bottom off mat- still not full but able to contract glutes for standing as erect as possible.     Time 12     Period Weeks     Status PROGRESSING    Target date  11/27/2023       PT LONG TERM GOAL #6  Title Patient will tolerate sitting unsupported demonstrating erect sitting posture for 30 minutes with CGA to demonstrate improved back extensor strength and improved sitting tolerance.   Baseline 12/13/2021= Patient demonstrated unsupported sitting at edge of mat for approx 20 min;  12/29/2021- Patient performed approx 30 min of dynamic sitting activities today. 06/06/2022= Patient demonstrated ability to sit and perform static and dynamic UE/LE movement with only Supervision.   Time 12   Period Weeks   Status GOAL MET           7.  Patient will tolerate 2 minutes or more of standing in sit to stand lift or with max assist +2 in order to indicate improved lower extremity weightbearing tolerance for progression to standing in parallel bars. Baseline: 1 minute on most recent stand 02/21/22; 06/06/2022- Patient did attempt today after complaining of right LE pain last week. He attempted 3 stands using sit to stand lift. - all over 1 min- last one approx 48 sec-  stopped due to fatigue. More erect standing in lift today - still poor gluteal strength but able to activate glutes and extend hips upon command but unable to hold > 5 sec. 07/28/2022- Will attempt standing next visit but patient has been able to stand since last progress note for up to 3 min at a time at his best. 09/07/2022-  attempted standing with max +2 and patient able to stand 15-20 sec so will now  revise goal; 11/23/2022- Patient at his best between last 2 visits was able to stand approx 50 sec with max A+2.  11/30/2022- Patient performed 4 trials of static standing - max A +2- clearing bottom and using forearms to bear weight  03/15/2023: Patient performed 2 trials of static standing - max A +2- clearing edge of mat and using forearms to bear weight for approximately 45 seconds, is able to come into approximately 45 degrees of knee flexion from sit  to stand; 05/31/2023- Patient has not attempted standing in 1 month but able to perform x 4 trials with max A +2 (PT and his mom) and able to clear his bottom off mat and attempted to activate his glutes and apply some forearm pressure - able to stand between 25-40 sec today. 09/04/2023- Patient performed 3 trials of standing ranging from 17 -47 sec - improving right knee terminal knee ext and more activation upon command with gluteals- able to clear mat table. Continued to require Max A+2 and blocking knees with forearm/trunk support.   Goal status: PROGRESSING Target date: 11/27/2023   8. Patient will tolerate sitting unsupported demonstrating ability to perform dynamic UE/LE activities for 30 minutes  independently to demonstrate ability to sit at edge of bed to eat or perform some ADL's/exercise for optimal quality of life.             Baseline: 06/06/2022- Patient able to sit and perform some UE/LE exercises but requires CGA at times for safety- he is able to static sit for 30 min with supervision. 07/27/2022= Patient able to now sit > 30 min with Supervision  only - performing static and dynamic activities. Will keep goal active to ensure patient is consistent. 09/07/2022=  Patient able to demonstrate consistent ability to sit at edge of mat during visits             Goal Status: MET             Target date: 08/29/2022  9.  Patient will perform rolling left to right with Mod assist for improved independence with bed mobility to assist with dressing and positioning in bed. Baseline: 09/04/2023- Mother reported last visit that he was having some difficulty with assisting with bed mobility. Goal status: NEW Target date: 11/27/2023    10. Patient will perform active bridging -able to clear bottom   Off mat for 10 reps with PT/caregiver holding his feet    For improved trunk ext strength with standing and to    To assist with be mobility/positioning.  Baseline: 09/04/2023= (PT assisted- holding right LE In neutral and pressure on each foot) - Difficulty clearing bottom off mat. Goal status: NEW Target date: 11/27/2023           Plan     Clinical Impression Statement  Patient presented with good motivation for today's visit. He was able to perform several resistive LE strengthening without significant difficulty other than weakness. He demonstrated improving ability to work his right LE against some resistance and activate appropriate muscles. He was please with being able to use the resistive bands and able to feel his muscles straining.   Pt will continue to benefit from skilled physical therapy intervention to address impairments, improve QOL, and attain therapy goals.    Personal Factors and Comorbidities Comorbidity 3+;Time since onset of injury/illness/exacerbation    Comorbidities CVA, diabetes, Seizures    Examination-Activity Limitations Bathing;Bed Mobility;Bend;Caring for Others;Carry;Dressing;Hygiene/Grooming;Lift;Locomotion Level;Reach Overhead;Self Feeding;Sit;Squat;Stairs;Stand;Transfers;Toileting    Examination-Participation Restrictions  Cleaning;Community Activity;Driving;Laundry;Medication Management;Meal Prep;Occupation;Personal Finances;Shop;Yard Work;Volunteer    Stability/Clinical Decision Making Evolving/Moderate complexity    Rehab Potential Fair    PT Frequency 1-2x / week    PT Duration 12 weeks    PT Treatment/Interventions ADLs/Self Care Home Management;Cryotherapy;Electrical Stimulation;Moist Heat;Ultrasound;DME Instruction;Gait training;Stair training;Functional mobility training;Therapeutic exercise;Balance training;Patient/family education;Orthotic Fit/Training;Neuromuscular re-education;Wheelchair mobility training;Manual techniques;Passive range of motion;Dry needling;Energy conservation;Taping;Visual/perceptual remediation/compensation;Joint Manipulations    PT Next Visit Plan core strength/motor control in short-sitting, sit to stand if able; Continue with  progressive LE Strengthening. Stand activities as appropriate.    PT Home Exercise Plan No changes to HEP today    Consulted and Agree with Plan of Care Patient;Family member/caregiver    Family Member Consulted             03/09/2023, 8:04 AM      1:49 PM, 10/12/23 Reyes LOISE London PT Physical Therapist - [facility]  1:49 PM 10/12/23

## 2023-10-16 ENCOUNTER — Ambulatory Visit: Payer: Medicare Other

## 2023-10-16 ENCOUNTER — Ambulatory Visit: Payer: Medicare Other | Admitting: Occupational Therapy

## 2023-10-16 DIAGNOSIS — M6281 Muscle weakness (generalized): Secondary | ICD-10-CM

## 2023-10-16 DIAGNOSIS — I6389 Other cerebral infarction: Secondary | ICD-10-CM | POA: Diagnosis not present

## 2023-10-16 DIAGNOSIS — R2689 Other abnormalities of gait and mobility: Secondary | ICD-10-CM

## 2023-10-16 DIAGNOSIS — R2681 Unsteadiness on feet: Secondary | ICD-10-CM

## 2023-10-16 DIAGNOSIS — R278 Other lack of coordination: Secondary | ICD-10-CM

## 2023-10-16 DIAGNOSIS — R262 Difficulty in walking, not elsewhere classified: Secondary | ICD-10-CM | POA: Diagnosis not present

## 2023-10-16 DIAGNOSIS — I6930 Unspecified sequelae of cerebral infarction: Secondary | ICD-10-CM | POA: Diagnosis not present

## 2023-10-16 DIAGNOSIS — L8994 Pressure ulcer of unspecified site, stage 4: Secondary | ICD-10-CM | POA: Diagnosis not present

## 2023-10-16 DIAGNOSIS — R269 Unspecified abnormalities of gait and mobility: Secondary | ICD-10-CM

## 2023-10-16 DIAGNOSIS — R32 Unspecified urinary incontinence: Secondary | ICD-10-CM | POA: Diagnosis not present

## 2023-10-16 NOTE — Therapy (Unsigned)
 OUTPATIENT PHYSICAL THERAPY TREATMENT    Patient Name: Paul Hall MRN: [id number] DOB:1995-10-24, 28 y.o., male Today's Date: 10/17/2023  PCP:  [facility] PROVIDER:  Morene Sous, PA-C   PT End of Session - 10/16/23 1730     Visit Number 166    Number of Visits 184   date corrected from most recent recert   Date for PT Re-Evaluation 11/27/23    Authorization Type [employer]/ [place] Medicaid    Authorization Time Period 01/04/21-03/29/21; Recert 03/24/2021-06/16/2021; Recert 09/15/2021- 12/08/2021; Recert 12/13/2021-03/07/2022    Progress Note Due on Visit 170    PT Start Time 1531    PT Stop Time 1614    PT Time Calculation (min) 43 min    Equipment Utilized During Treatment Gait belt    Activity Tolerance Patient tolerated treatment well    Behavior During Therapy WFL for tasks assessed/performed                            Past Medical History:  Diagnosis Date   Diabetes mellitus (HCC)    Hypertension    Stroke (HCC)    Past Surgical History:  Procedure Laterality Date   IVC FILTER PLACEMENT ([facility] HX)     LEG SURGERY     PEG TUBE PLACEMENT     TRACHEOSTOMY     Patient Active Problem List   Diagnosis Date Noted   Sepsis due to vancomycin  resistant Enterococcus species (HCC) 06/06/2019   SIRS (systemic inflammatory response syndrome) (HCC) 06/05/2019   Acute lower UTI 06/05/2019   VRE (vancomycin -resistant Enterococci) infection 06/05/2019   Anemia 06/05/2019   Skin ulcer of sacrum with necrosis of muscle (HCC)    Urinary retention    Type 2 diabetes mellitus without complication, with long-term current use of insulin  (HCC)    Tachycardia    Lower extremity edema    Acute metabolic encephalopathy    Obstructive sleep apnea    Morbid obesity with BMI of 60.0-69.9, adult (HCC)    Goals of care, counseling/discussion    Palliative care encounter    Sepsis (HCC) 04/27/2019   H/O insulin  dependent diabetes mellitus 04/27/2019   History of  CVA with residual deficit 04/27/2019   Seizure disorder (HCC) 04/27/2019   Decubitus ulcer of sacral region, stage 4 (HCC) 04/27/2019    REFERRING DIAG: Cerebral infarction, unspecified   THERAPY DIAG:  Muscle weakness (generalized)  Other lack of coordination  Difficulty in walking, not elsewhere classified  Abnormality of gait and mobility  Unsteadiness on feet  Other abnormalities of gait and mobility  History of CVA with residual deficit  Rationale for Evaluation and Treatment Rehabilitation  PERTINENT HISTORY: Paul Hall is a 26yoM who presents with severe weakness, quadriparesis, altered sensorium, and visual impairment s/p critical illness and prolonged hospitalization. Pt hospitalized in October 2020 with ARDS 2/2 COVID19 infection. Pt sustained a complex and lengthy hospitalization which included tracheostomy, prolonged sedation, ECMO. In this period pt sustained CVA and SDH. Pt has now been liberated from tracheostomy and G-tube. Pt has since been hospitalized for wound infection and UTI. Pt lives with parents at home, has hospital bed and left chair, hoyer lift transfers, and power WC for mobility needs. Pt needs heavy physical assistance with ADL 2/2 BUE contractures and motor dysfunction   PRECAUTIONS: Fall  SUBJECTIVE:  Patient reports having a good weekend and no new issues.   PAIN:  Are you having pain? No. But feeling tight.  TODAY'S TREATMENT:  - 10/16/2023          TE:  Manual stretching to B LE and low back today in supine with modified- wedge and 2 pillows under patient - Single knee to chest -hold 30 sec x3 -Lower trunk rotation - hold 45 sec x 4 -B hamstring stretch- hold 45 sec x 4  - B hip ER/IR- hold 30 sec x 3  - B hip add/groin stretch hold 30 sec x 3  - Chest stretch- instructed to relax RUE (increased flexor tone) and able to lay arm straight on table with stretch.    PATIENT EDUCATION: Education details: Exercise technique with VC    HOME EXERCISE PROGRAM:  Access Code: [email or web address] URL: [email or web address] Date: 03/23/2022 Prepared by: Reyes London  Exercises - Supine Bridge  - 3 x weekly - 3 sets - 10 reps - 2 hold - Supine Gluteal Sets  - 3 x weekly - 3 sets - 10 reps - 5 sec hold - Supine Quad Set  - 1 x daily - 3 x weekly - 3 sets - 10 reps - 5 hold - Seated Long Arc Quad  - 1 x daily - 7 x weekly - 3 sets - 10 reps - Seated Hip Adduction Squeeze with Ball  - 1 x daily - 3 x weekly - 3 sets - 10 reps - 5 hold - Seated Hip Abduction  - 1 x daily - 3 x weekly - 3 sets - 10 reps - 2 hold    PT Short Term Goals -       PT SHORT TERM GOAL #1   Title Pt will be independent with HEP in order to improve strength and balance in order to decrease fall risk and improve function at home and work.    Baseline 01/04/2021= No formal HEP in place; 12/12 no HEP in place; 05/10/2021-Patient and his father were able to report compliance with curent HEP consisting of mostly seated/reclined LE strengthening. Both verbalize no questions at this time.    Time 6    Period Weeks    Status Achieved    Target Date 02/15/21            PT LONG TERM GOAL #1     Title Patient will increase BLE gross strength by 1/2 muscle grade to improve functional strength for improved independence with potential gait, increased standing tolerance and increased ADL ability.     Baseline 01/04/2021- Patient presents with 1/5 to 3-/5 B LE strength with MMT; 12/12: goal partially met for Left knee/hip; 05/10/2021= 2-/5 bilateal Hip flex; 3+/5 bilateral Knee ext; 06/21/2021= Patient presents with 2-/5 bilateral Hip flex; 3+/5 bilateral knee ext/flex; 2-/5 left ankle DF; 0/5 right ankle- and able to increase reps and resistance with LE's. 09/15/2021- Patient technically presents with 2-/5 B hip flex/abd/add - but he is able to raise his hip up to approx 100 deg which has improved. 3+/5 Bilateral knee ext, 2-/5 left ankle and 0/5 right ankle.   12/08/2021= Patient able to lift left knee at 110 deg of hip flex; presents with 3+/5 knee ext, 2-/5 left ankle DF and 0/5 right ankle DF, 2-/5 bilateral Hip abd in seated position.    12/6: R: knee 3+/5 ext, 2/5 flexion, left knee 3+/5 extension, 3+/5 flexion, R hip: 2+/5 hip add, 2+/5 hip ABD L hip: 4-/5 hip ABD, 3+/5 hip ADD, 3+/5 hip flexion; 06/06/2022= Patient now presents with 2-/5 right ankle DF/PF;     Time 12  Period Weeks     Status MET    Target Date 03/07/2022         PT LONG TERM GOAL #2    Title Patient will tolerate sitting unsupported demonstrating erect sitting posture for 15 minutes with CGA to demonstrate improved back extensor strength and improved sitting tolerance.     Baseline 01/04/2021- Patient confied to sitting in lift chair or electric power chair with back support and unable to sit upright without physical assistance; 12/12: tolerates <1 minutes upright unsupported sitting. 05/10/2021=static sit with forward trunk lean  in his power wheelchair without back support x approx 3 min. 06/21/2021=Unable to assess today due to patient with acute back pain but on previous visit able to sit x 8 min without back support. 09/15/2021- on last visit- 09/13/2021- patient was able to sit unsupported x 8 min at edge of mat. 10/13/2023 - Patient was able to sit at edge of mat with varying level of assist today from SBA to min A for a total of 20 min. 12/13/2021= Patient demonstrated unsupported sitting at edge of mat for approx 20 min    Time 12     Period Weeks     Status GOAL MET    Target Date 12/08/21          PT LONG TERM GOAL #3    Title Patient will demonstrate ability to perform static standing in // bars > 2 min with Max Assist  without loss of balance and fair posture for improved overall strength for pre-gait and transfer activities.     Baseline 01/04/2021= Patient current uanble to stand- Dependent on hoyer or sit to stand lift for transfers. 05/10/2021=Not appropriate yet- Currently  still dependent with all transfers using hoyer. 06/21/2021= Patient continuing now to focus on LE strengthening to prepare for standing-unable to try today due to acute low back pain-  planning on attempting in new cert period. 09/15/2021- Patient has attempted standing 2x in past two week- max Assist of 2 people - only once was he successful to clearing his bottom from chair - Will continue to be a focus during the new certification. 12/13/2021= Patient has been limited secondary to increased overall low back pain during this certification and will require more time to focus on this goal.  12/6: not assessed this date, will assess at date when 2-3 PTs are present for assistance     Time 12     Period Weeks     Status GOAL not appropriate at this time - may attempt in future once Patient presents with improved overall LE strength.                 PT LONG TERM GOAL #4    Title Pt will improve FOTO score by 10 points or more demonstrating improved perceived functional ability     Baseline FOTO 7 on 10/17; 03/15/21: FOTO 12; 05/10/2021 06/21/2021= 1; 09/15/2021= 9; 12/13/2021= Will issue next visit 12/6: 4; 06/06/2022= Will assess next visit; Goal not appropriate as patient is not ambulatory.     Time 12     Period Weeks     Status WITHDRAWN    Target date 11/30/2022         PT LONG TERM GOAL #5    Title Patient will perform sit to stand transfer with appropriate AD and max assist of 2 people with 75% consistency to prepare for pregait activities.     Baseline 09/15/2021= Patient unable to stand well- unable  to clear his bottom off chair with Max assist of 2 persons. 12/13/2021- Goal not appropriate to try yet but will keep and roll over to next cert as shift continues to focus on transfers/standing; 4/24= Patient able to perform active ankle DF/PF with right LE and able to raise his knee into seated march and clear floor without physical assist today - as previously unable as well as lift right knee ext to near  full ROM to improve strength for eventual standing. 09/07/2022- Goal continues to not be appropriate but patient is now standing some and will keep goal active; 11/23/2022= patient is now performing sit to stand with max A +2 and now able to clear his bottom off mat. 11/30/2022- Patient continues to perform sit to stand transfers with Max A+2 and able to clear bottom well off mat but not erect yet. 05/31/2023- Patient is able to stand consistently with max A +2 - unable to achieve full knee or trunk ext yet is able to clear bottom off mat for improved LE weightbearing. 09/04/2023- Patient performed several Sit to stand requiring max A+2  with improving posture- able to ext R knee better - near terminal knee and able to clear bottom off mat- still not full but able to contract glutes for standing as erect as possible.     Time 12     Period Weeks     Status PROGRESSING    Target date  11/27/2023       PT LONG TERM GOAL #6  Title Patient will tolerate sitting unsupported demonstrating erect sitting posture for 30 minutes with CGA to demonstrate improved back extensor strength and improved sitting tolerance.   Baseline 12/13/2021= Patient demonstrated unsupported sitting at edge of mat for approx 20 min;  12/29/2021- Patient performed approx 30 min of dynamic sitting activities today. 06/06/2022= Patient demonstrated ability to sit and perform static and dynamic UE/LE movement with only Supervision.   Time 12   Period Weeks   Status GOAL MET           7.  Patient will tolerate 2 minutes or more of standing in sit to stand lift or with max assist +2 in order to indicate improved lower extremity weightbearing tolerance for progression to standing in parallel bars. Baseline: 1 minute on most recent stand 02/21/22; 06/06/2022- Patient did attempt today after complaining of right LE pain last week. He attempted 3 stands using sit to stand lift. - all over 1 min- last one approx 48 sec- stopped due to fatigue. More  erect standing in lift today - still poor gluteal strength but able to activate glutes and extend hips upon command but unable to hold > 5 sec. 07/28/2022- Will attempt standing next visit but patient has been able to stand since last progress note for up to 3 min at a time at his best. 09/07/2022-  attempted standing with max +2 and patient able to stand 15-20 sec so will now  revise goal; 11/23/2022- Patient at his best between last 2 visits was able to stand approx 50 sec with max A+2.  11/30/2022- Patient performed 4 trials of static standing - max A +2- clearing bottom and using forearms to bear weight  03/15/2023: Patient performed 2 trials of static standing - max A +2- clearing edge of mat and using forearms to bear weight for approximately 45 seconds, is able to come into approximately 45 degrees of knee flexion from sit to stand; 05/31/2023- Patient has not attempted standing  in 1 month but able to perform x 4 trials with max A +2 (PT and his mom) and able to clear his bottom off mat and attempted to activate his glutes and apply some forearm pressure - able to stand between 25-40 sec today. 09/04/2023- Patient performed 3 trials of standing ranging from 17 -47 sec - improving right knee terminal knee ext and more activation upon command with gluteals- able to clear mat table. Continued to require Max A+2 and blocking knees with forearm/trunk support.   Goal status: PROGRESSING Target date: 11/27/2023   8. Patient will tolerate sitting unsupported demonstrating ability to perform dynamic UE/LE activities for 30 minutes  independently to demonstrate ability to sit at edge of bed to eat or perform some ADL's/exercise for optimal quality of life.             Baseline: 06/06/2022- Patient able to sit and perform some UE/LE exercises but requires CGA at times for safety- he is able to static sit for 30 min with supervision. 07/27/2022= Patient able to now sit > 30 min with Supervision only - performing static and  dynamic activities. Will keep goal active to ensure patient is consistent. 09/07/2022=  Patient able to demonstrate consistent ability to sit at edge of mat during visits             Goal Status: MET             Target date: 08/29/2022  9.  Patient will perform rolling left to right with Mod assist for improved independence with bed mobility to assist with dressing and positioning in bed. Baseline: 09/04/2023- Mother reported last visit that he was having some difficulty with assisting with bed mobility. Goal status: NEW Target date: 11/27/2023    10. Patient will perform active bridging -able to clear bottom   Off mat for 10 reps with PT/caregiver holding his feet    For improved trunk ext strength with standing and to    To assist with be mobility/positioning.  Baseline: 09/04/2023= (PT assisted- holding right LE In neutral and pressure on each foot) - Difficulty clearing bottom off mat. Goal status: NEW Target date: 11/27/2023           Plan     Clinical Impression Statement  Patient presented with good motivation for today's visit.  Treatment focused on some LE stretching due to increased overall tightness. Reviewed some stretching techniques with patient mother that she or his Dad can perform at home. Patient reported doing well after session with no increased pain or stiffness.   Pt will continue to benefit from skilled physical therapy intervention to address impairments, improve QOL, and attain therapy goals.    Personal Factors and Comorbidities Comorbidity 3+;Time since onset of injury/illness/exacerbation    Comorbidities CVA, diabetes, Seizures    Examination-Activity Limitations Bathing;Bed Mobility;Bend;Caring for Others;Carry;Dressing;Hygiene/Grooming;Lift;Locomotion Level;Reach Overhead;Self Feeding;Sit;Squat;Stairs;Stand;Transfers;Toileting    Examination-Participation Restrictions Cleaning;Community Activity;Driving;Laundry;Medication Management;Meal Prep;Occupation;Personal  Finances;Shop;Yard Work;Volunteer    Stability/Clinical Decision Making Evolving/Moderate complexity    Rehab Potential Fair    PT Frequency 1-2x / week    PT Duration 12 weeks    PT Treatment/Interventions ADLs/Self Care Home Management;Cryotherapy;Electrical Stimulation;Moist Heat;Ultrasound;DME Instruction;Gait training;Stair training;Functional mobility training;Therapeutic exercise;Balance training;Patient/family education;Orthotic Fit/Training;Neuromuscular re-education;Wheelchair mobility training;Manual techniques;Passive range of motion;Dry needling;Energy conservation;Taping;Visual/perceptual remediation/compensation;Joint Manipulations    PT Next Visit Plan core strength/motor control in short-sitting, sit to stand if able; Continue with progressive LE Strengthening. Stand activities as appropriate.    PT Home Exercise Plan No changes to  HEP today    Consulted and Agree with Plan of Care Patient;Family member/caregiver    Family Member Consulted             03/09/2023, 8:04 AM      9:30 AM, 10/17/23 Reyes LOISE London PT Physical Therapist - [facility]  9:30 AM 10/17/23

## 2023-10-17 NOTE — Therapy (Addendum)
 . Occupational Therapy Neuro Treatment Note  Patient Name: Paul Hall MRN: [id number] DOB:Apr 13, 1995, 28 y.o., male Today's Date: 10/17/2023  PCP: Dr. Bertell REFERRING PROVIDER: Dr. Bertell   .  OT End of Session - 10/17/23 1042     Visit Number [id number]    Number of Visits 156    Date for OT Re-Evaluation 10/30/23    Authorization Type 09/26/23    OT Start Time 1623    OT Stop Time 1708    OT Time Calculation (min) 45 min    Equipment Utilized During Treatment tilt in space power wc    Activity Tolerance Patient tolerated treatment well    Behavior During Therapy WFL for tasks assessed/performed                       Past Medical History:  Diagnosis Date   Diabetes mellitus (HCC)    Hypertension    Stroke (HCC)    Past Surgical History:  Procedure Laterality Date   IVC FILTER PLACEMENT ([facility] HX)     LEG SURGERY     PEG TUBE PLACEMENT     TRACHEOSTOMY     Patient Active Problem List   Diagnosis Date Noted   Sepsis due to vancomycin  resistant Enterococcus species (HCC) 06/06/2019   SIRS (systemic inflammatory response syndrome) (HCC) 06/05/2019   Acute lower UTI 06/05/2019   VRE (vancomycin -resistant Enterococci) infection 06/05/2019   Anemia 06/05/2019   Skin ulcer of sacrum with necrosis of muscle (HCC)    Urinary retention    Type 2 diabetes mellitus without complication, with long-term current use of insulin  (HCC)    Tachycardia    Lower extremity edema    Acute metabolic encephalopathy    Obstructive sleep apnea    Morbid obesity with BMI of 60.0-69.9, adult (HCC)    Goals of care, counseling/discussion    Palliative care encounter    Sepsis (HCC) 04/27/2019   H/O insulin  dependent diabetes mellitus 04/27/2019   History of CVA with residual deficit 04/27/2019   Seizure disorder (HCC) 04/27/2019   Decubitus ulcer of sacral region, stage 4 (HCC) 04/27/2019   ONSET DATE: 01/2019  REFERRING DIAG: CVA/COVID-19  THERAPY DIAG:  Muscle weakness  (generalized)  Rationale for Evaluation and Treatment Rehabilitation  SUBJECTIVE:  Pt./caregiver reports that they have started the process to have the [employer]us Hand System at home.  SUBJECTIVE STATEMENT:  Pt.  Reports doing well today  Pt accompanied by: self and family member  PERTINENT HISTORY:  Pt. is a 28 y.o. male who was diagnosed with COVID-19, and CVA with resultant quadriplegia. Pt. Was then hospitalized with VRE UTI. PMHx includes: urinary retention, seizure disorder, obstructive sleep disorder, DM Type II, Morbid obesity.   PRECAUTIONS: None  WEIGHT BEARING RESTRICTIONS No  PAIN:  No reports of pain Are you having pain? no  FALLS: Has patient fallen in last 6 months? No  LIVING ENVIRONMENT: Lives with: lives with their family Lives in: House/apartment Stairs: No Level Entry Has following equipment at home: Wheelchair (power) and hoyer lift, sit to stand lift  PLOF: Independent  PATIENT GOALS: To be able to engage in more daily care tasks.  OBJECTIVE:   HAND DOMINANCE: Right  ADLs:  Transfers/ambulation related to ADLs: Eating: Pt. reports being able to hold standard utensils, and is starting to engage more in self-feeding tasks, hand to mouth patterns. Pt. Reports that he does as much as he can with the task, and family assists  with  the remainder of the task. Grooming: Pt. Is able to initiate holding an electric toothbrush, and brush his teeth. Family assists LB Dressing: Total Assist UE dressing: Pt. is now able to reach up to actively assist with grasping , and pulling his gown down. Toileting:  Total Assist Bathing: MaxA UB, Total assist LB Tub Shower transfers: N/A Equipment: See above    IADLs: Shopping: Relies on family to assist Light housekeeping: Total Assist Meal Prep: Total Assist Community mobility:   Medication management:  Total Assist  Financial management: N/A Handwriting: Not legible: Pt. Is able to hold a pen with the left  hand, and initiate marking the page. Pt.'s eye glasses were not available  MOBILITY STATUS:  Power w/c  POSTURE COMMENTS:  Pt. Requires position changes in his power w/c  ACTIVITY TOLERANCE: Activity tolerance:  Fair  FUNCTIONAL OUTCOME MEASURES: FOTO: 40 TR score: 45  05/25/2022:   FOTO: 50 TR score: 45  07/20/22: FOTO 55  03/08/23: 51  UPPER EXTREMITY ROM     Active ROM Left eval Left  03/21/2022 Left 05/25/2022 L  07/20/22 Left 07/25/2022 Left  08/15/2022 Left  09/26/2022 Left  11/16/2022 Left 12/28/2022 Left 03/13/23 Left 05/10/23  Shoulder flexion 119 123 125 130  136 138 130 142 144 144  Shoulder abduction 110 115 115 115  118 121 125 142 140 140  Shoulder adduction             Shoulder extension             Shoulder internal rotation             Shoulder external rotation             Elbow flexion 135(135) 145 145 145  145    145 145  Elbow extension -27(-18) -21(-20) -20(-16)  -25(-10) -23(-10) -21(-10) -20(-18) -20(-18) -20(-17) -17(-16)  Wrist flexion             Wrist extension 10(50) 19(50) 28(50)  30(50) 35(55) 36(55) 36(60) 40(60) 45(60) 45(60)  Wrist ulnar deviation             Wrist radial deviation             Wrist pronation             Wrist supination   Limited by flexor tone       10(15)   (Blank rows = not tested)    Active ROM Right eval Right 03/21/2022 Right 05/25/2022 R  07/20/22 Right  07/25/22 Right 08/15/2022 Right 09/26/2022 Right 11/16/2022 Right 12/28/2022 Right 03/13/23 Right 05/10/23  Shoulder flexion 106 scaption 105  Scaption 62 flexion 110 scaption 100  105 105 105 112 Scaption 123 Scaption 122  Scaption  Shoulder abduction 114 90 97 100  105 117 120 124 124 122  Shoulder adduction             Shoulder extension             Shoulder internal rotation             Shoulder external rotation             Elbow flexion 120(130) 145 145 140  144 140 142 148 148 145  Elbow extension -45(-35) -22(-35) -28(-22)  -32(-21) -32(-28)  -28(-26) -28(-28) -27(-26) -27(-40) -38(-35)  Wrist flexion             Wrist extension -30(10) -25(10) -10(30)  -10(20) -10(25) -20(40) -30(10) -22(34) -22(35) -32(22)  Wrist ulnar  deviation             Wrist radial deviation             Wrist pronation             Wrist supination   Limited by flexor tone            12/28/2022: Thumb radial abduction: Right: 12(46), Left: 40(54)  Digits: Bilateral 2nd through 5th digit: MP Hyperextension with PIP, an DIP flexion     UPPER EXTREMITY MMT:     Left eval Left 03/21/2022 Left  05/25/2022 L 07/20/22 Left  08/15/2022 Left 09/26/2022 Left 11/16/2022 Left 12/28/2022 Left  03/13/2023 Left  05/10/23  Shoulder flexion 3-/5 3/5 3+/5 4+/5 4+/5 4+/5 4+/5 4+/5 4+/5 4+/5  Shoulder abduction 3-/5 3-/5 4-/5 4+/5 4+/5 4+5 4+/5 4+/5 4+/5 4+/5  Shoulder adduction            Shoulder extension            Shoulder internal rotation            Shoulder external rotation            Elbow flexion 3/5 3+/5 4-/5 4+/5 5/5 5/5 5/5 5/5 5/5 5/5  Elbow extension 2-/5 2/5  4+/5 4+/5 4+/5  4/5 4/5 4/5 through available range  Wrist flexion            Wrist extension 2/5 2+/5 3-/5  3-/5 3-/5 3-/5 3/5 3/5 3/5  Wrist ulnar deviation            Wrist radial deviation            Wrist pronation            Wrist supination            (Blank rows = not tested)  Right eval Right 03/21/2022 Right  05/25/2022 R 07/20/22 Right 08/15/2022 Right 09/26/2022 Right 11/16/2022 Right 12/28/2022 Right 03/13/2023 Right 05/10/23  Shoulder flexion 3-/5 scaption 3-/5 3-/5 3+/5 3+/5 3+/5 3/5 3/5 3/5 3+/5  Shoulder abduction 3-/5 3-/5 3-/5 4/5 4/5 4/5 4/5 4/5 4/5 4/-5  Shoulder adduction            Shoulder extension            Shoulder internal rotation            Shoulder external rotation            Elbow flexion 3/5 3/5 3+/5 4/5 4+/5 4+/5 4+/5 4+/5 4+/5 4+/5  Elbow extension 2-/5 2/5 3-/5 4+/5 4+/5 4+/5 4+/5 4-/5 4-/5 4-/5  Wrist flexion            Wrist extension 2-/5 2-/5  2/5 2/5 2/5 2/5 2/5 2/5 2/5 2/5  Wrist ulnar deviation            Wrist radial deviation            Wrist pronation            Wrist supination              HAND FUNCTION:  Grip strength: Right: 0 lbs; Left: 0 lbs and Lateral pinch: Right: 5 lbs, Left: 2 lbs  03/21/2022: Lateral pinch: Right: 3.5 lbs, Left: 2 lbs  07/20/22: Grip strength: Right: 3 lbs; Left: 4 lbs and Lateral pinch: Right: 5 lbs, Left: 3 lbs  08/15/2022:  Grip strength: Right: 0 lbs; Left: 5 lbs and Lateral pinch: Right: 2 lbs, Left: 4 lbs  09/26/2022  Grip strength:  Right: 0 lbs; Left: 8 lbs and Lateral pinch: Right: 2 lbs, Left: 5 lbs  11/16/2022  Grip strength: Right: 0 lbs; Left: 8 lbs  9/25/20204  Grip strength: Right: 0 lbs; Left: 8 lbs  Grip strength: Right: 0 lbs; Left: 8 lbs   03/08/23:  Grip strength: Right: 0 lbs without PROM, 5 lbs after PROM, Left 8 lbs  05/10/23:  Grip strength: TBD  Bilateral digit PIP/DIP flexion contractures with MP hyperextension with attempts for AROM. Pt. is able to tolerate AROM to the bilateral digits at the initial evaluation however, has a history of pain in the digits.  COORDINATION: Eval: Pt. is unable to grasp 9-hole test pegs. Pt. is able to initiate grasping larger pegs, and is able to hold a pen in the left hand.  07/20/22: 2 min 36 seconds to remove 9 pegs from 9 hole peg test - cues to locate pegs 2/2 low vision. Pt. is able to initiate grasping larger pegs on R hand and is able to hold a pen in the left hand.  08/15/2022:    Vision, and sensation limiting accuracy of 9 hole peg test results. Pt. Was able to grasp and remove vertical pegs intermittently with cues.    05/10/23: TBD  SENSATION: Light touch: Impaired   EDEMA:  N/A  MUSCLE TONE: BUE flexor Spasticity  COGNITION Overall cognitive status: Continue to assess in functional context  VISION:   Subjective report: Pt. was not wearing glasses at the time of the initial eval.  Baseline  vision: Vision is very limited. Wears glasses all the time Visual history: History of impaired vision following CVA. Pt. Has received treatment through the [facility] low vision rehabilitation program.   VISION ASSESSMENT: Impaired To be further assessed in functional context  PERCEPTION: Impaired   PRAXIS: Impaired: motor planning  OBSERVATIONS:  Pt reports being on Tramadol   TODAY'S TREATMENT 10/16/23   Therapeutic Activities:  -Facilitated bilateral alternating HUE bicep, and tricep movements using red theraband with hand over hand assist, support proximally at each elbow, and verbal cues for each rep.  Therapeutic Ex.:   -Emphasis was placed on slow prolonged gentle passive stretching for RUE, forearm supination, bilateral wrist extension, wrist extension, bilateral digit MP, PIP, and DIP extension following moist heat, and left wrist flexion/extension, PIP/DIP flexion, and extension while heat was applied to the RUE. PROM was performed to decrease stiffness, and prepare the RUE for functional use. ROM was performed to the shoulder right elbow, forearm, wrist, and digits. PROM for bilateral digit MP, PIP, and DIP extension in preparation for placing them onto a flat surface.        PATIENT EDUCATION: Education details: PROM/AAROM using the [employer] System Person educated: Patient and Parent Education method: Explanation Education comprehension: verbalized understanding  HOME EXERCISE PROGRAM:   Bilateral hand position in the [employer]us   GOALS: Goals reviewed with patient? Yes  SHORT TERM GOALS: Target date: 09/18/2023     To assess splint fit, and make appropriate adjustments to promote good skin integrity through the palmar surface of the bilateral hands.  Baseline: 05/25/22: Goal currently met, however ongoing as needs to assess splint fit arise. 03/23/2022: Pt. is wearing splints a couple of hours at night bilateral resting hand splints. 03/21/2022: Pt. is wearing splints  a couple of hours at night bilateral resting hand splints. Goal status: Deferred   LONG TERM GOALS: Target date:10/30/2023      FOTO score will Improve by 2 points for Pt. perceived improvement  with the assessment specific ADL/IADL tasks.  Baseline:  03/08/23: 51; 12/28/2022: FOTO score: 56; 11/16/2022: 52 09/27/2022: 51 07/20/2022: FOTO 55 05/25/2022: FOTO score: 50,  TR score: 45 Eval: FOTO score: 40,  TR score: 45 Goal status: Achieved   2.   Pt. will independently perform oral care for 100% of the task after complete set-up. Baseline: 05/10/23: Pt. Performs 90% of the task. Pt.'s mother assists with 10% of the task for thorough cleaning the hard to reach places way in the back of the oral cavity.03/08/23: not as much assist required proximally, but to adjust positioning of toothbrush; 02/13/23: Pt. Continues to require assist proximally 01/09/2023: Continue 12/28/2022:  Pt. Continues to complete 90% of the task with complete set-up, with visual cues, and occassional assist of the right elbow. 11/16/2022: Pt. Completes 90% of the task with complete set-up. 09/26/2022: Completes 90% of the task, proximal assist at times required at the elbow.  08/15/2022: Completes 90% of the task, proximal assist at times required at the elbow. 07/20/22: completes 90% of task, limited by shoulder flexion. 05/25/2022:  Pt. Is able to initiate and perform oral care for approximately 90% of the task. Complete set-up required. Assi needed only for the very back teeth. 03/23/2022: Pt. Is able to initiate and perform oral care for approximately 75% of the task. Pt. Requires assist at proximally at the elbow for through oral care. 03/21/2022: Pt. Is able to initiate and perform oral care for approximately 75% of the task. Pt. Requires assist at proximally at the elbow for through oral care. Eval: Pt. is able to initiate using an electric toothbrush. Pt. requires assist for set-up, and assist for thoroughness, and as he Pt. fatigues. Goal  status:  Partially met, Goal discontinued  3.  Pt. Will be modified independence with self-feeding for 100% using a swivel spoon, and standard fork after complete set-up Baseline: 05/10/2023: Pt. feeds self 95-100% of the meal. with occasional assist to reposition the utensil. 03/08/23: indep with cup with a handle and lid, assist to reposition eating utensils in hand;  02/13/23:  Pt. Is now able to consistently, and efficiently use a swivel spoon for eating cereal with the left hand.01/09/2023: Continue 12/28/2022: Pt. Is now feeding himself 90% of the time using a swivel spoon with the left hand, and 95% of the time with the fork. Pt. Continues to have difficulty with cutting food. 11/16/2022: 100% with finger foods, Pt. Is initiating with a swivel spoon, and universal cuff for fork use, however requires assist for consistency, and accuracy.  09/26/2022: 90% using the spoon, assist at time with the forearm motion, and grip on the utensils. 100% for finger foods.  08/15/2022:  90% using the spoon, assit at time with the forearm motion, and grip on the utensils. 100% for finger foods-independent with set-up- unsupervised.  07/20/22: self-feeds cereal using spoon 90% of task. 05/25/2022: Pt. Is able to use a spoon to scoop cereal when feeding himself cereal 85% of the time. Pt. Is able to feed himself snack/finger foods 100% of the time. Pt. Continues to work on consistency of  stabilizing a cup/mug when drinking. Pt. Is able to grasp a water  bottle with assist initially, with assist tapering off as he drinks.03/23/2022: Pt. Is able to perform scooping cereal for 75% of the time. Pt. required assist, and support at the left elbow, and Pt. Presents with limited forearm supination when using the spoon, and bringing it towards his mouth. Pt. Is able to use a fork  to spear items, and perform the hand to mouth pattern.  03/21/2022: Pt. Is able to perform scooping cereal for 75% of the time. Pt. required assist, and support at  the left elbow, and Pt. presents with limited forearm supination when using the spoon, and bringing it towards his mouth. Pt. Is able to use a fork to spear items, and perform the hand to mouth pattern.  Eval: Pt. is able to hold standard standard utensils. Pt. Performs as much of the task as he, can and has assistance for the remainder. Goal status:  Improved, Achieved  4.  Pt. will improve grasp patterns and consistently grasp 1/4 objects for ADL, and IADL tasks.  Baseline: 08/27/23: Pt. is able to grasp flat discs, and sustain a secure hold on them while moving the UE through various planes. 08/07/23: Continue 05/31/2023: Continue 05/10/23: Pt. is consistently able to grasp 1/2 objects with visual cues. Pt. Requires assist, and increased visual cues to grasp 1/4 objects on an elevated plane.  Pt. Is unable to grasp the flat objects at the tabletop surface. Pt. Is grasping grasping small puppy vitamins.03/08/23: 1/2 objects efficiently; 02/13/2023: 1/2 objects efficiently 01/09/2023: Continue 12/28/2022: Continues to grasp 1/4 pegs with min a + visual cues, consistently grasping 1/2 objects with visual cues 11/16/2022: Continues to grasp 1/4 pegs with min a + visual cues, consistently grasping 1/2 objects with visual cues 09/26/2022: Continues to grasp 1/4 pegs with min a + visual cues, consistently grasping 1/2 objects with visual cues. 08/15/2022: grasps 1/4 pegs with min a + visual cues, consistently grasping 1/2 objects with visual cues. 07/20/22: grasps 1/4 pegs with min a + visual cues, consistently grasping 1/2 objects with visual cues. 05/25/2022: Pt. Is working on improving consistency of grasping 1/2 objects with visual cues.  03/23/2022: Pt. Is able to grasp 1 objects consistently,and continues to work on the hand patterns needed to grasp 1/2 objects.03/21/2022: Pt. Is able to grasp 1 objects consistently,and continues to work on the hand patterns needed to grasp 1/2 objects. Eval: Pt. is  able to grasp 1 objects intermittently using a lateral grasp pattern. Goal status: Ongoing  5.  Pt. will independently write his name legibly with letter sizes under 1. Baseline: 05/10/23: Deferred 03/08/23: Not addressed this period; 02/13/2023: Continue 01/09/2023: Continue. 12/28/2022: Pt. is now using an IPad Pen. 11/16/2022: Pt. Continues to be able to write name with smaller letter size. Pt. Is signing name with a computer styus. Pt. Requires visual cues, and assist 09/26/2022: Pt. Continues to be able to write name with smaller letter size. Pt. Is signing name with a computer styus. Pt. Requires visual cues, and assist. 08/15/2022: Pt. Is able to write name with smaller letter size. Pt. Is signing name with a computer styus. Pt. Requires visual cues, and assist. 07/20/22: stabilizing assist to write name with 75% legibility with 2 letters. 05/25/2022: Pt. to continue to work towards formulating grasp patterns in preparation for grasping a large width pen. Pt. Requires visual cues. 03/23/2022: Pt. Is able to write his name with modA, however has difficulty with formulating letter sizes less than 2 in size with 50% legibility for the 3 letters of his name.03/21/2022: Pt. Is able to write his name with modA, however has difficulty with formulating letter sizes less than 2 in size with 50% legibility for the 3 letters of his name. Eval: Pt is able to hold a thin marker with his left hand, and formulate a line, and initiate a circular pattern (Pt.  without glasses today) Goal status: Deferred  6. Pt. Will reach up to comb/brush his hair with minA.  Baseline: 05/10/23: Pt. is able to use a pick independently with the left hand with occasional assist to the far side of the left his head. 03/08/23: Pt uses pick in L hand to reach 75% of his head; unable to reach R side of head; 02/13/2023:Pt. is able to use a hair pick with the left hand on the right side of his head, top, and back of the head. Pt. Continues to  have difficulty reaching further to the right side of his head with the left. 01/09/2023: Continue 12/28/2022: Pt. Is progressing, and is now able to use a hair pick with the left hand on the right side of his head, top, and back of the head. Pt. Continues to have difficulty reaching further to the right side of his head with the left. 11/16/2022: Pt. Is able to use a hair pick for the left side of his head using his left hand. Pt. Presents with difficulty reaching to the right side of his head.09/26/2022: Pt. Is able to use a hair pick for the left side of his head using his left hand. Pt. Presents with difficulty reaching to the right side of his head. 5/13/202: 75% using a hair pick, assist to the right side of the head. 07/20/22: reaches 75% of head, assist for far R side of head, fatigues quickly. 05/25/2022: Pt. Is able to reach up with the left hand to the left side, top, and back of his head. 03/23/2022: Pt. is now able to more consistently initiate reaching up to his head with his left hand in preparation for haircare12/18/2023: Pt. is now able to more consistently initiate reaching up to his head with his left hand in preparation for haircare. Eval: Pt. is able to initiate reaching up for hair care with a long handled brush, however is unable to sustain UE's in elevation to perform the task.     Goal status: Achieved  7. Pt. Will independently navigate the w/c through his environment with minA with visual scanning, and hand placement on the controls.  Baseline: 11/16/2022: Deferred 2/2 vision 09/26/2022: Pt. Is now navigating his w/c ouside the home, and around his block. 08/15/2022: Pt. Requires minA to setup hand on controls and MIN cues to navigate the w/c in wide spaces, requires MIN - MOD cues to navigate the w/c through more narrow doorways, and tighter turns - varies based on good vs bad vision days.07/20/22: Pt. Requires minA to setup hand on controls and MIN cues to navigate the w/c in wide spaces,  requires MIN - MOD cues to navigate the w/c through more narrow doorways, and tighter turns - varies based on good vs bad vision days. 05/25/2022: Pt. Requires minA  and  cues to navigate the w/c in wide spaces, and requires Mod cues to navigate the w/c through more narrow doorways, and tighter turns. Pt. Requires max cues for scanning through the environment, and moderate cues for hand placement on the controls.     Goal status: Deferred 2/2 vision    8. Pt. Will improve bilateral grip strength to be able to independently grasp, and pull up blankets, and linens.   Baseline: 05/10/23: Deferred 03/08/23: R grip 0-5 lbs, L grip 8 lbs; pt reports he can consistently pull up blankets and linens independently.  02/13/2023: Continue. Pt. presents with digit PIP, and DIP flexor tightness. 01/09/2023: Continue 12/28/2023: R: 0#, L: 5#  Pt. Is now able to more consistently grasp and pull blankets up over him. 11/16/2022: R: 0 L: 8# Pt. Is more consistently grasping his blankets and attempting to pull them up. 09/26/2022: R: 0  L: 8# Pt. is attempting to grasp, and pull blankets up more, using mostly the left hand. 08/15/2022: Pt. has difficulty securely holding, and pulling up blankets, and linens.    Goal status:Deferred  9. Pt. will consistently actively control the releasing of blankets, covers, and linens from his hands once they are in the desired position over him. Baseline:12/28/2022: Pt. Is consistently actively able to release linens once they are in the desired position over him. 11/16/2022: Pt. continues to improve with actively releasing blankets form his hands. 09/26/2022: Pt. is improving with actively releasing blankets form his hands. 08/15/2022: Pt. has difficulty with controlled releasing of blankets/linens from his grasp.     Goal status: Achieved    10. Pt. Will improve bilateral UE strength by 2 MM grades to assist with ADLs, and IADLs  Baseline: 08/27/23:  Right: shoulder flexion: 3+/5, abduction: 4-/5,  elbow flexion: 4+/5 , extension: 4-/5 wrist extension: 2/5; Left: shoulder flexion: 4+/5, abduction: 4+/5, elbow flexion: 5/5 , extension: 4/5  wrist extension: 3/5  08/07/23: Continue 05/31/2023: Continue 05/10/23: Right: shoulder flexion: 3+/5, abduction: 4-/5, elbow flexion: 4+/5 , extension: 4-/5 wrist extension: 2/5; Left: shoulder flexion: 4+/5, abduction: 4+/5, elbow flexion: 5/5 , extension: 4/5  wrist extension: 3/5 03/13/2023: Right: shoulder flexion: 3/5, abduction: 4/5, elbow flexion: 4+/5 , extension: 4-/5 wrist extension: 2/5; Left: shoulder flexion: 4+/5, abduction: 4+/5, elbow flexion: 5/5 , extension: 4/5  wrist extension: 3/5 03/08/23: NT this date d/t as pt arrived late; 02/13/2023: Continue 01/09/2023: Continue 12/28/2022: Right: shoulder flexion: 3/5, abduction: 4/5, elbow flexion: 4+/5 , extension: 4-/5 wrist extension: 2/5; Left: shoulder flexion: 4+/5, abduction: 4+/5, elbow flexion: 5/5 , extension: 4/5  wrist extension: 3/5 Right: shoulder flexion: 3+/5, abduction: 4/5, elbow flexion: 4+/5 , extension: 4+/5 wrist extension: 2/5; Left: shoulder flexion: 4+/5, abduction: 4+/5, elbow flexion: 5/5 , extension: 4+/5 wrist extension: 3-/5 11/16/2022: Right: shoulder flexion: 3/5, abduction: 4/5, elbow flexion: 4+/5 , extension: 4+/5 wrist extension: 2/5; Left: shoulder flexion: 4+/5, abduction: 4+/5, elbow flexion: 5/5 , extension:  wrist extension: 3-/5 Right: shoulder flexion: 3+/5, abduction: 4/5, elbow flexion: 4+/5 , extension: 4+/5 wrist extension: 2/5; Left: shoulder flexion: 4+/5, abduction: 4+/5, elbow flexion: 5/5 , extension: 4+/5 wrist extension: 3-/5 Goal status: Ongoing  11. Pt. will improve right wrist extension by 10 degrees to initiate reaching for items in preparation for ADLs. Baseline: 08/23/2023: R Wrist Ext: -31(-8)  08/07/23: Continue 05/31/23: Continue 05/10/23: Right wrist extension: -32(22) 03/13/23: -22(35) 03/08/23: NT this date as pt arrived late; 02/13/2023: Continue  01/09/2023: Continue 12/28/2022: -22(34) 11/16/2022: -30(10)    Goal status: Ongoing      12. Pt. Will require donn/doff a Tank top T-shirt efficiently with supervision and full set-up.    Baseline:08/23/23: Pt. Is able to independently doff his shirt, requiring assist to lean forward and loosen the back. 08/07/23: Continue 05/31/2023: Pt. Requires maxA donning a pullover sweatshirt, Mod-maxA doffing it. T-shirt tank TBD 05/10/23: Pt. Presents with difficulty completing, and requires increased time to complete    Goal status: Ongoing    13. Pt. Will independently perform bilateral/bimanual hand tasks needed to fold towels/linens/laundry Baseline:08/23/23: Pt. Continues to have difficulty using bilateral/bimanual hand tasks needed to fold towels/linens/laundry. 08/07/23: Continue 05/31/23: Continue 05/10/23:  Pt. Has difficulty folding towels/lines/laundry using bilateral hands Goal  status:Ongoing  14. Pt. Will independently, and efficiently reach his right arm over the side of the chair to pet his dog.    Baseline: 08/23/2023: Pt. Is now able to reach over the side of the armrest of his recliner chair to pet his dog.  08/07/23: Continue 05/31/2023: Continue 05/10/23: Pt. is unable to efficiently reach his right UE over the side of his recliner chair to pet his dog.    Goal status: Achieved    15. Pt. Will independently, and efficiently reach out in front of him to efficiently place items onto his table while sitting in his recliner.     Baseline:  08/23/2023: Pt. has difficulty consistently reaching out in front of him to place items on the on a tabletop surface with the right hand. 08/07/23: Continue 2/26/225: Continue 05/10/23: Pt. has difficulty reaching out in front of him far enough to efficiently place items on the table while sitting in a recliner chair.    Goal status: Ongoing   ASSESSMENT:  CLINICAL IMPRESSION:     Pt. was able to tolerate the hand/wrist device fit using the smallest hand attachment  on the [employer] hand system. Pt. required BUE support,the [employer] ramp, and assist for repositioning for proper alignment throughout the ROM for each virtual game. Pt. continues to be highly motivated. Pt.'s caregivers continue to be very supportive to continue working towards improving BUE functioning, ROM, strength, motor control, [facility] skills, as well as visual compensatory strategies in order to maximize independence with daily ADLs, and IADL functioning.     PERFORMANCE DEFICITS in functional skills including ADLs, IADLs, coordination, dexterity, proprioception, ROM, strength, pain, FMC, GMC, decreased knowledge of use of DME, and UE functional use, cognitive skills including safety awareness, and psychosocial skills including coping strategies, environmental adaptation, habits, and routines and behaviors.   IMPAIRMENTS are limiting patient from ADLs, IADLs, leisure, and social participation.   COMORBIDITIES may have co-morbidities  that affects occupational performance. Patient will benefit from skilled OT to address above impairments and improve overall function.  MODIFICATION OR ASSISTANCE TO COMPLETE EVALUATION: Maximum or significant modification of tasks or assist is necessary to complete an evaluation.  OT OCCUPATIONAL PROFILE AND HISTORY: Comprehensive assessment: Review of records and extensive additional review of physical, cognitive, psychosocial history related to current functional performance.  CLINICAL DECISION MAKING: High - multiple treatment options, significant modification of task necessary  REHAB POTENTIAL: Fair    EVALUATION COMPLEXITY: High    PLAN: OT FREQUENCY: 2 x's a week  OT DURATION:12 weeks  PLANNED INTERVENTIONS: self care/ADL training, therapeutic exercise, therapeutic activity, neuromuscular re-education, manual therapy, passive range of motion, patient/family education, and cognitive remediation/compensation RECOMMENDED OTHER SERVICES: PT  CONSULTED  AND AGREED WITH PLAN OF CARE: Patient and family member/caregiver  PLAN FOR NEXT SESSION: Review HEP  Richardson Otter, MS, OTR/L  09/28/23

## 2023-10-18 ENCOUNTER — Ambulatory Visit: Payer: Medicare Other

## 2023-10-18 ENCOUNTER — Ambulatory Visit: Payer: Medicare Other | Admitting: Occupational Therapy

## 2023-10-18 DIAGNOSIS — R2689 Other abnormalities of gait and mobility: Secondary | ICD-10-CM | POA: Diagnosis not present

## 2023-10-18 DIAGNOSIS — R262 Difficulty in walking, not elsewhere classified: Secondary | ICD-10-CM | POA: Diagnosis not present

## 2023-10-18 DIAGNOSIS — I6930 Unspecified sequelae of cerebral infarction: Secondary | ICD-10-CM | POA: Diagnosis not present

## 2023-10-18 DIAGNOSIS — R269 Unspecified abnormalities of gait and mobility: Secondary | ICD-10-CM

## 2023-10-18 DIAGNOSIS — M6281 Muscle weakness (generalized): Secondary | ICD-10-CM | POA: Diagnosis not present

## 2023-10-18 DIAGNOSIS — R2681 Unsteadiness on feet: Secondary | ICD-10-CM | POA: Diagnosis not present

## 2023-10-18 DIAGNOSIS — R278 Other lack of coordination: Secondary | ICD-10-CM

## 2023-10-18 NOTE — Therapy (Unsigned)
 . Occupational Therapy Neuro Treatment Note  Patient Name: Paul Hall MRN: [id number] DOB:[birth year], 28 y.o., male Today's Date: 10/19/2023  PCP: Dr. Bertell REFERRING PROVIDER: Dr. Bertell   .  OT End of Session - 10/19/23 1316     Visit Number [id number]    Number of Visits 156    Date for OT Re-Evaluation 10/30/23    OT Start Time 1530    OT Stop Time 1615    OT Time Calculation (min) 45 min    Activity Tolerance Patient tolerated treatment well    Behavior During Therapy WFL for tasks assessed/performed                        Past Medical History:  Diagnosis Date   Diabetes mellitus (HCC)    Hypertension    Stroke [facility])    Past Surgical History:  Procedure Laterality Date   IVC FILTER PLACEMENT ([facility] HX)     LEG SURGERY     PEG TUBE PLACEMENT     TRACHEOSTOMY     Patient Active Problem List   Diagnosis Date Noted   Sepsis due to vancomycin  resistant Enterococcus species (HCC) 06/06/2019   SIRS (systemic inflammatory response syndrome) (HCC) 06/05/2019   Acute lower UTI 06/05/2019   VRE (vancomycin -resistant Enterococci) infection 06/05/2019   Anemia 06/05/2019   Skin ulcer of sacrum with necrosis of muscle (HCC)    Urinary retention    Type 2 diabetes mellitus without complication, with long-term current use of insulin  (HCC)    Tachycardia    Lower extremity edema    Acute metabolic encephalopathy    Obstructive sleep apnea    Morbid obesity with BMI of 60.0-69.9, adult (HCC)    Goals of care, counseling/discussion    Palliative care encounter    Sepsis (HCC) 04/27/2019   H/O insulin  dependent diabetes mellitus 04/27/2019   History of CVA with residual deficit 04/27/2019   Seizure disorder (HCC) 04/27/2019   Decubitus ulcer of sacral region, stage 4 (HCC) 04/27/2019   ONSET DATE: 01/2019  REFERRING DIAG: CVA/COVID-19  THERAPY DIAG:  Muscle weakness (generalized)  Other lack of coordination  Rationale for Evaluation and Treatment  Rehabilitation  SUBJECTIVE:  Pt. Reports that he has been able to red/ green theraband during home exercises.  SUBJECTIVE STATEMENT:  Pt.  Reports doing well today  Pt accompanied by: self and family member  PERTINENT HISTORY:  Pt. is a 28 y.o. male who was diagnosed with COVID-19, and CVA with resultant quadriplegia. Pt. Was then hospitalized with VRE UTI. PMHx includes: urinary retention, seizure disorder, obstructive sleep disorder, DM Type II, Morbid obesity.   PRECAUTIONS: None  WEIGHT BEARING RESTRICTIONS No  PAIN:  No reports of pain Are you having pain? no  FALLS: Has patient fallen in last 6 months? No  LIVING ENVIRONMENT: Lives with: lives with their family Lives in: House/apartment Stairs: No Level Entry Has following equipment at home: Wheelchair (power) and hoyer lift, sit to stand lift  PLOF: Independent  PATIENT GOALS: To be able to engage in more daily care tasks.  OBJECTIVE:   HAND DOMINANCE: Right  ADLs:  Transfers/ambulation related to ADLs: Eating: Pt. reports being able to hold standard utensils, and is starting to engage more in self-feeding tasks, hand to mouth patterns. Pt. Reports that he does as much as he can with the task, and family assists  with the remainder of the task. Grooming: Pt. Is able to initiate holding an electric  toothbrush, and brush his teeth. Family assists LB Dressing: Total Assist UE dressing: Pt. is now able to reach up to actively assist with grasping , and pulling his gown down. Toileting:  Total Assist Bathing: MaxA UB, Total assist LB Tub Shower transfers: N/A Equipment: See above    IADLs: Shopping: Relies on family to assist Light housekeeping: Total Assist Meal Prep: Total Assist Community mobility:   Medication management:  Total Assist  Financial management: N/A Handwriting: Not legible: Pt. Is able to hold a pen with the left hand, and initiate marking the page. Pt.'s eye glasses were not  available  MOBILITY STATUS:  Power w/c  POSTURE COMMENTS:  Pt. Requires position changes in his power w/c  ACTIVITY TOLERANCE: Activity tolerance:  Fair  FUNCTIONAL OUTCOME MEASURES: FOTO: 40 TR score: 45  05/25/2022:   FOTO: 50 TR score: 45  07/20/22: FOTO 55  03/08/23: 51  UPPER EXTREMITY ROM     Active ROM Left eval Left  03/21/2022 Left 05/25/2022 L  07/20/22 Left 07/25/2022 Left  08/15/2022 Left  09/26/2022 Left  11/16/2022 Left 12/28/2022 Left 03/13/23 Left 05/10/23  Shoulder flexion 119 123 125 130  136 138 130 142 144 144  Shoulder abduction 110 115 115 115  118 121 125 142 140 140  Shoulder adduction             Shoulder extension             Shoulder internal rotation             Shoulder external rotation             Elbow flexion 135(135) 145 145 145  145    145 145  Elbow extension -27(-18) -21(-20) -20(-16)  -25(-10) -23(-10) -21(-10) -20(-18) -20(-18) -20(-17) -17(-16)  Wrist flexion             Wrist extension 10(50) 19(50) 28(50)  30(50) 35(55) 36(55) 36(60) 40(60) 45(60) 45(60)  Wrist ulnar deviation             Wrist radial deviation             Wrist pronation             Wrist supination   Limited by flexor tone       10(15)   (Blank rows = not tested)    Active ROM Right eval Right 03/21/2022 Right 05/25/2022 R  07/20/22 Right  07/25/22 Right 08/15/2022 Right 09/26/2022 Right 11/16/2022 Right 12/28/2022 Right 03/13/23 Right 05/10/23  Shoulder flexion 106 scaption 105  Scaption 62 flexion 110 scaption 100  105 105 105 112 Scaption 123 Scaption 122  Scaption  Shoulder abduction 114 90 97 100  105 117 120 124 124 122  Shoulder adduction             Shoulder extension             Shoulder internal rotation             Shoulder external rotation             Elbow flexion 120(130) 145 145 140  144 140 142 148 148 145  Elbow extension -45(-35) -22(-35) -28(-22)  -32(-21) -32(-28) -28(-26) -28(-28) -27(-26) -27(-40) -38(-35)  Wrist flexion              Wrist extension -30(10) -25(10) -10(30)  -10(20) -10(25) -20(40) -30(10) -22(34) -22(35) -32(22)  Wrist ulnar deviation             Wrist  radial deviation             Wrist pronation             Wrist supination   Limited by flexor tone            12/28/2022: Thumb radial abduction: Right: 12(46), Left: 40(54)  Digits: Bilateral 2nd through 5th digit: MP Hyperextension with PIP, an DIP flexion     UPPER EXTREMITY MMT:     Left eval Left 03/21/2022 Left  05/25/2022 L 07/20/22 Left  08/15/2022 Left 09/26/2022 Left 11/16/2022 Left 12/28/2022 Left  03/13/2023 Left  05/10/23  Shoulder flexion 3-/5 3/5 3+/5 4+/5 4+/5 4+/5 4+/5 4+/5 4+/5 4+/5  Shoulder abduction 3-/5 3-/5 4-/5 4+/5 4+/5 4+5 4+/5 4+/5 4+/5 4+/5  Shoulder adduction            Shoulder extension            Shoulder internal rotation            Shoulder external rotation            Elbow flexion 3/5 3+/5 4-/5 4+/5 5/5 5/5 5/5 5/5 5/5 5/5  Elbow extension 2-/5 2/5  4+/5 4+/5 4+/5  4/5 4/5 4/5 through available range  Wrist flexion            Wrist extension 2/5 2+/5 3-/5  3-/5 3-/5 3-/5 3/5 3/5 3/5  Wrist ulnar deviation            Wrist radial deviation            Wrist pronation            Wrist supination            (Blank rows = not tested)  Right eval Right 03/21/2022 Right  05/25/2022 R 07/20/22 Right 08/15/2022 Right 09/26/2022 Right 11/16/2022 Right 12/28/2022 Right 03/13/2023 Right 05/10/23  Shoulder flexion 3-/5 scaption 3-/5 3-/5 3+/5 3+/5 3+/5 3/5 3/5 3/5 3+/5  Shoulder abduction 3-/5 3-/5 3-/5 4/5 4/5 4/5 4/5 4/5 4/5 4/-5  Shoulder adduction            Shoulder extension            Shoulder internal rotation            Shoulder external rotation            Elbow flexion 3/5 3/5 3+/5 4/5 4+/5 4+/5 4+/5 4+/5 4+/5 4+/5  Elbow extension 2-/5 2/5 3-/5 4+/5 4+/5 4+/5 4+/5 4-/5 4-/5 4-/5  Wrist flexion            Wrist extension 2-/5 2-/5 2/5 2/5 2/5 2/5 2/5 2/5 2/5 2/5  Wrist ulnar deviation             Wrist radial deviation            Wrist pronation            Wrist supination              HAND FUNCTION:  Grip strength: Right: 0 lbs; Left: 0 lbs and Lateral pinch: Right: 5 lbs, Left: 2 lbs  03/21/2022: Lateral pinch: Right: 3.5 lbs, Left: 2 lbs  07/20/22: Grip strength: Right: 3 lbs; Left: 4 lbs and Lateral pinch: Right: 5 lbs, Left: 3 lbs  08/15/2022:  Grip strength: Right: 0 lbs; Left: 5 lbs and Lateral pinch: Right: 2 lbs, Left: 4 lbs  09/26/2022  Grip strength: Right: 0 lbs; Left: 8 lbs and Lateral pinch: Right: 2 lbs, Left: 5  lbs  11/16/2022  Grip strength: Right: 0 lbs; Left: 8 lbs  9/25/20204  Grip strength: Right: 0 lbs; Left: 8 lbs  Grip strength: Right: 0 lbs; Left: 8 lbs   03/08/23:  Grip strength: Right: 0 lbs without PROM, 5 lbs after PROM, Left 8 lbs  05/10/23:  Grip strength: TBD  Bilateral digit PIP/DIP flexion contractures with MP hyperextension with attempts for AROM. Pt. is able to tolerate AROM to the bilateral digits at the initial evaluation however, has a history of pain in the digits.  COORDINATION: Eval: Pt. is unable to grasp 9-hole test pegs. Pt. is able to initiate grasping larger pegs, and is able to hold a pen in the left hand.  07/20/22: 2 min 36 seconds to remove 9 pegs from 9 hole peg test - cues to locate pegs 2/2 low vision. Pt. is able to initiate grasping larger pegs on R hand and is able to hold a pen in the left hand.  08/15/2022:    Vision, and sensation limiting accuracy of 9 hole peg test results. Pt. Was able to grasp and remove vertical pegs intermittently with cues.    05/10/23: TBD  SENSATION: Light touch: Impaired   EDEMA:  N/A  MUSCLE TONE: BUE flexor Spasticity  COGNITION Overall cognitive status: Continue to assess in functional context  VISION:   Subjective report: Pt. was not wearing glasses at the time of the initial eval.  Baseline vision: Vision is very limited. Wears glasses all the time Visual  history: History of impaired vision following CVA. Pt. Has received treatment through the [facility] low vision rehabilitation program.   VISION ASSESSMENT: Impaired To be further assessed in functional context  PERCEPTION: Impaired   PRAXIS: Impaired: motor planning  OBSERVATIONS:  Pt reports being on Tramadol   TODAY'S TREATMENT 10/18/23   Therapeutic Activities:  -Pt. Performed [facility] grasp, transferring circular large round tipped pegs, transferring pegs from the R hand into the L hand, followed by horizontal abduction, shoulder abduction, shoulder flexion and elbow extension, then placing pegs into a raised container on raised tabletop surface.   Therapeutic Ex.:   -Performed Bilateral horizontal abduction using blue theraband. -Emphasis was placed on slow prolonged gentle passive stretching for RUE, forearm supination, bilateral wrist extension, wrist extension, bilateral digit MP, PIP, and DIP extension following moist heat, and left wrist flexion/extension, PIP/DIP flexion, and extension while heat was applied to the RUE. PROM was performed to decrease stiffness, and prepare the RUE for functional use. ROM was performed to the shoulder right elbow, forearm, wrist, and digits. PROM for bilateral digit MP, PIP, and DIP extension in preparation for placing them onto a flat surface.     PATIENT EDUCATION: Education details: PROM/AAROM using the [employer] System Person educated: Patient and Parent Education method: Explanation Education comprehension: verbalized understanding  HOME EXERCISE PROGRAM:   Bilateral hand position in the [employer]   GOALS: Goals reviewed with patient? Yes  SHORT TERM GOALS: Target date: 09/18/2023     To assess splint fit, and make appropriate adjustments to promote good skin integrity through the palmar surface of the bilateral hands.  Baseline: 05/25/22: Goal currently met, however ongoing as needs to assess splint fit arise. 03/23/2022: Pt. is wearing  splints a couple of hours at night bilateral resting hand splints. 03/21/2022: Pt. is wearing splints a couple of hours at night bilateral resting hand splints. Goal status: Deferred   LONG TERM GOALS: Target date:10/30/2023      FOTO score will Improve by  2 points for Pt. perceived improvement with the assessment specific ADL/IADL tasks.  Baseline:  03/08/23: 51; 12/28/2022: FOTO score: 56; 11/16/2022: 52 09/27/2022: 51 07/20/2022: FOTO 55 05/25/2022: FOTO score: 50,  TR score: 45 Eval: FOTO score: 40,  TR score: 45 Goal status: Achieved   2.   Pt. will independently perform oral care for 100% of the task after complete set-up. Baseline: 05/10/23: Pt. Performs 90% of the task. Pt.'s mother assists with 10% of the task for thorough cleaning the hard to reach places way in the back of the oral cavity.03/08/23: not as much assist required proximally, but to adjust positioning of toothbrush; 02/13/23: Pt. Continues to require assist proximally 01/09/2023: Continue 12/28/2022:  Pt. Continues to complete 90% of the task with complete set-up, with visual cues, and occassional assist of the right elbow. 11/16/2022: Pt. Completes 90% of the task with complete set-up. 09/26/2022: Completes 90% of the task, proximal assist at times required at the elbow.  08/15/2022: Completes 90% of the task, proximal assist at times required at the elbow. 07/20/22: completes 90% of task, limited by shoulder flexion. 05/25/2022:  Pt. Is able to initiate and perform oral care for approximately 90% of the task. Complete set-up required. Assi needed only for the very back teeth. 03/23/2022: Pt. Is able to initiate and perform oral care for approximately 75% of the task. Pt. Requires assist at proximally at the elbow for through oral care. 03/21/2022: Pt. Is able to initiate and perform oral care for approximately 75% of the task. Pt. Requires assist at proximally at the elbow for through oral care. Eval: Pt. is able to initiate using an electric  toothbrush. Pt. requires assist for set-up, and assist for thoroughness, and as he Pt. fatigues. Goal status:  Partially met, Goal discontinued  3.  Pt. Will be modified independence with self-feeding for 100% using a swivel spoon, and standard fork after complete set-up Baseline: 05/10/2023: Pt. feeds self 95-100% of the meal. with occasional assist to reposition the utensil. 03/08/23: indep with cup with a handle and lid, assist to reposition eating utensils in hand;  02/13/23:  Pt. Is now able to consistently, and efficiently use a swivel spoon for eating cereal with the left hand.01/09/2023: Continue 12/28/2022: Pt. Is now feeding himself 90% of the time using a swivel spoon with the left hand, and 95% of the time with the fork. Pt. Continues to have difficulty with cutting food. 11/16/2022: 100% with finger foods, Pt. Is initiating with a swivel spoon, and universal cuff for fork use, however requires assist for consistency, and accuracy.  09/26/2022: 90% using the spoon, assist at time with the forearm motion, and grip on the utensils. 100% for finger foods.  08/15/2022:  90% using the spoon, assit at time with the forearm motion, and grip on the utensils. 100% for finger foods-independent with set-up- unsupervised.  07/20/22: self-feeds cereal using spoon 90% of task. 05/25/2022: Pt. Is able to use a spoon to scoop cereal when feeding himself cereal 85% of the time. Pt. Is able to feed himself snack/finger foods 100% of the time. Pt. Continues to work on consistency of  stabilizing a cup/mug when drinking. Pt. Is able to grasp a water  bottle with assist initially, with assist tapering off as he drinks.03/23/2022: Pt. Is able to perform scooping cereal for 75% of the time. Pt. required assist, and support at the left elbow, and Pt. Presents with limited forearm supination when using the spoon, and bringing it towards his mouth. Pt.  Is able to use a fork to spear items, and perform the hand to mouth pattern.   03/21/2022: Pt. Is able to perform scooping cereal for 75% of the time. Pt. required assist, and support at the left elbow, and Pt. presents with limited forearm supination when using the spoon, and bringing it towards his mouth. Pt. Is able to use a fork to spear items, and perform the hand to mouth pattern.  Eval: Pt. is able to hold standard standard utensils. Pt. Performs as much of the task as he, can and has assistance for the remainder. Goal status:  Improved, Achieved  4.  Pt. will improve grasp patterns and consistently grasp 1/4 objects for ADL, and IADL tasks.  Baseline: 08/27/23: Pt. is able to grasp flat discs, and sustain a secure hold on them while moving the UE through various planes. 08/07/23: Continue 05/31/2023: Continue 05/10/23: Pt. is consistently able to grasp 1/2 objects with visual cues. Pt. Requires assist, and increased visual cues to grasp 1/4 objects on an elevated plane.  Pt. Is unable to grasp the flat objects at the tabletop surface. Pt. Is grasping grasping small puppy vitamins.03/08/23: 1/2 objects efficiently; 02/13/2023: 1/2 objects efficiently 01/09/2023: Continue 12/28/2022: Continues to grasp 1/4 pegs with min a + visual cues, consistently grasping 1/2 objects with visual cues 11/16/2022: Continues to grasp 1/4 pegs with min a + visual cues, consistently grasping 1/2 objects with visual cues 09/26/2022: Continues to grasp 1/4 pegs with min a + visual cues, consistently grasping 1/2 objects with visual cues. 08/15/2022: grasps 1/4 pegs with min a + visual cues, consistently grasping 1/2 objects with visual cues. 07/20/22: grasps 1/4 pegs with min a + visual cues, consistently grasping 1/2 objects with visual cues. 05/25/2022: Pt. Is working on improving consistency of grasping 1/2 objects with visual cues.  03/23/2022: Pt. Is able to grasp 1 objects consistently,and continues to work on the hand patterns needed to grasp 1/2 objects.03/21/2022: Pt. Is able to grasp 1  objects consistently,and continues to work on the hand patterns needed to grasp 1/2 objects. Eval: Pt. is able to grasp 1 objects intermittently using a lateral grasp pattern. Goal status: Ongoing  5.  Pt. will independently write his name legibly with letter sizes under 1. Baseline: 05/10/23: Deferred 03/08/23: Not addressed this period; 02/13/2023: Continue 01/09/2023: Continue. 12/28/2022: Pt. is now using an IPad Pen. 11/16/2022: Pt. Continues to be able to write name with smaller letter size. Pt. Is signing name with a computer styus. Pt. Requires visual cues, and assist 09/26/2022: Pt. Continues to be able to write name with smaller letter size. Pt. Is signing name with a computer styus. Pt. Requires visual cues, and assist. 08/15/2022: Pt. Is able to write name with smaller letter size. Pt. Is signing name with a computer styus. Pt. Requires visual cues, and assist. 07/20/22: stabilizing assist to write name with 75% legibility with 2 letters. 05/25/2022: Pt. to continue to work towards formulating grasp patterns in preparation for grasping a large width pen. Pt. Requires visual cues. 03/23/2022: Pt. Is able to write his name with modA, however has difficulty with formulating letter sizes less than 2 in size with 50% legibility for the 3 letters of his name.03/21/2022: Pt. Is able to write his name with modA, however has difficulty with formulating letter sizes less than 2 in size with 50% legibility for the 3 letters of his name. Eval: Pt is able to hold a thin marker with his left hand, and formulate a line,  and initiate a circular pattern (Pt. without glasses today) Goal status: Deferred  6. Pt. Will reach up to comb/brush his hair with minA.  Baseline: 05/10/23: Pt. is able to use a pick independently with the left hand with occasional assist to the far side of the left his head. 03/08/23: Pt uses pick in L hand to reach 75% of his head; unable to reach R side of head; 02/13/2023:Pt. is able to use a  hair pick with the left hand on the right side of his head, top, and back of the head. Pt. Continues to have difficulty reaching further to the right side of his head with the left. 01/09/2023: Continue 12/28/2022: Pt. Is progressing, and is now able to use a hair pick with the left hand on the right side of his head, top, and back of the head. Pt. Continues to have difficulty reaching further to the right side of his head with the left. 11/16/2022: Pt. Is able to use a hair pick for the left side of his head using his left hand. Pt. Presents with difficulty reaching to the right side of his head.09/26/2022: Pt. Is able to use a hair pick for the left side of his head using his left hand. Pt. Presents with difficulty reaching to the right side of his head. 5/13/202: 75% using a hair pick, assist to the right side of the head. 07/20/22: reaches 75% of head, assist for far R side of head, fatigues quickly. 05/25/2022: Pt. Is able to reach up with the left hand to the left side, top, and back of his head. 03/23/2022: Pt. is now able to more consistently initiate reaching up to his head with his left hand in preparation for haircare12/18/2023: Pt. is now able to more consistently initiate reaching up to his head with his left hand in preparation for haircare. Eval: Pt. is able to initiate reaching up for hair care with a long handled brush, however is unable to sustain UE's in elevation to perform the task.     Goal status: Achieved  7. Pt. Will independently navigate the w/c through his environment with minA with visual scanning, and hand placement on the controls.  Baseline: 11/16/2022: Deferred 2/2 vision 09/26/2022: Pt. Is now navigating his w/c ouside the home, and around his block. 08/15/2022: Pt. Requires minA to setup hand on controls and MIN cues to navigate the w/c in wide spaces, requires MIN - MOD cues to navigate the w/c through more narrow doorways, and tighter turns - varies based on good vs bad vision  days.07/20/22: Pt. Requires minA to setup hand on controls and MIN cues to navigate the w/c in wide spaces, requires MIN - MOD cues to navigate the w/c through more narrow doorways, and tighter turns - varies based on good vs bad vision days. 05/25/2022: Pt. Requires minA  and  cues to navigate the w/c in wide spaces, and requires Mod cues to navigate the w/c through more narrow doorways, and tighter turns. Pt. Requires max cues for scanning through the environment, and moderate cues for hand placement on the controls.     Goal status: Deferred 2/2 vision    8. Pt. Will improve bilateral grip strength to be able to independently grasp, and pull up blankets, and linens.   Baseline: 05/10/23: Deferred 03/08/23: R grip 0-5 lbs, L grip 8 lbs; pt reports he can consistently pull up blankets and linens independently.  02/13/2023: Continue. Pt. presents with digit PIP, and DIP flexor tightness. 01/09/2023:  Continue 12/28/2023: R: 0#, L: 5# Pt. Is now able to more consistently grasp and pull blankets up over him. 11/16/2022: R: 0 L: 8# Pt. Is more consistently grasping his blankets and attempting to pull them up. 09/26/2022: R: 0  L: 8# Pt. is attempting to grasp, and pull blankets up more, using mostly the left hand. 08/15/2022: Pt. has difficulty securely holding, and pulling up blankets, and linens.    Goal status:Deferred  9. Pt. will consistently actively control the releasing of blankets, covers, and linens from his hands once they are in the desired position over him. Baseline:12/28/2022: Pt. Is consistently actively able to release linens once they are in the desired position over him. 11/16/2022: Pt. continues to improve with actively releasing blankets form his hands. 09/26/2022: Pt. is improving with actively releasing blankets form his hands. 08/15/2022: Pt. has difficulty with controlled releasing of blankets/linens from his grasp.     Goal status: Achieved    10. Pt. Will improve bilateral UE strength by 2 MM  grades to assist with ADLs, and IADLs  Baseline: 08/27/23:  Right: shoulder flexion: 3+/5, abduction: 4-/5, elbow flexion: 4+/5 , extension: 4-/5 wrist extension: 2/5; Left: shoulder flexion: 4+/5, abduction: 4+/5, elbow flexion: 5/5 , extension: 4/5  wrist extension: 3/5  08/07/23: Continue 05/31/2023: Continue 05/10/23: Right: shoulder flexion: 3+/5, abduction: 4-/5, elbow flexion: 4+/5 , extension: 4-/5 wrist extension: 2/5; Left: shoulder flexion: 4+/5, abduction: 4+/5, elbow flexion: 5/5 , extension: 4/5  wrist extension: 3/5 03/13/2023: Right: shoulder flexion: 3/5, abduction: 4/5, elbow flexion: 4+/5 , extension: 4-/5 wrist extension: 2/5; Left: shoulder flexion: 4+/5, abduction: 4+/5, elbow flexion: 5/5 , extension: 4/5  wrist extension: 3/5 03/08/23: NT this date d/t as pt arrived late; 02/13/2023: Continue 01/09/2023: Continue 12/28/2022: Right: shoulder flexion: 3/5, abduction: 4/5, elbow flexion: 4+/5 , extension: 4-/5 wrist extension: 2/5; Left: shoulder flexion: 4+/5, abduction: 4+/5, elbow flexion: 5/5 , extension: 4/5  wrist extension: 3/5 Right: shoulder flexion: 3+/5, abduction: 4/5, elbow flexion: 4+/5 , extension: 4+/5 wrist extension: 2/5; Left: shoulder flexion: 4+/5, abduction: 4+/5, elbow flexion: 5/5 , extension: 4+/5 wrist extension: 3-/5 11/16/2022: Right: shoulder flexion: 3/5, abduction: 4/5, elbow flexion: 4+/5 , extension: 4+/5 wrist extension: 2/5; Left: shoulder flexion: 4+/5, abduction: 4+/5, elbow flexion: 5/5 , extension:  wrist extension: 3-/5 Right: shoulder flexion: 3+/5, abduction: 4/5, elbow flexion: 4+/5 , extension: 4+/5 wrist extension: 2/5; Left: shoulder flexion: 4+/5, abduction: 4+/5, elbow flexion: 5/5 , extension: 4+/5 wrist extension: 3-/5 Goal status: Ongoing  11. Pt. will improve right wrist extension by 10 degrees to initiate reaching for items in preparation for ADLs. Baseline: 08/23/2023: R Wrist Ext: -31(-8)  08/07/23: Continue 05/31/23: Continue 05/10/23: Right  wrist extension: -32(22) 03/13/23: -22(35) 03/08/23: NT this date as pt arrived late; 02/13/2023: Continue 01/09/2023: Continue 12/28/2022: -22(34) 11/16/2022: -30(10)    Goal status: Ongoing      12. Pt. Will require donn/doff a Tank top T-shirt efficiently with supervision and full set-up.    Baseline:08/23/23: Pt. Is able to independently doff his shirt, requiring assist to lean forward and loosen the back. 08/07/23: Continue 05/31/2023: Pt. Requires maxA donning a pullover sweatshirt, Mod-maxA doffing it. T-shirt tank TBD 05/10/23: Pt. Presents with difficulty completing, and requires increased time to complete    Goal status: Ongoing    13. Pt. Will independently perform bilateral/bimanual hand tasks needed to fold towels/linens/laundry Baseline:08/23/23: Pt. Continues to have difficulty using bilateral/bimanual hand tasks needed to fold towels/linens/laundry. 08/07/23: Continue 05/31/23: Continue 05/10/23:  Pt. Has difficulty  folding towels/lines/laundry using bilateral hands Goal status:Ongoing  14. Pt. Will independently, and efficiently reach his right arm over the side of the chair to pet his dog.    Baseline: 08/23/2023: Pt. Is now able to reach over the side of the armrest of his recliner chair to pet his dog.  08/07/23: Continue 05/31/2023: Continue 05/10/23: Pt. is unable to efficiently reach his right UE over the side of his recliner chair to pet his dog.    Goal status: Achieved    15. Pt. Will independently, and efficiently reach out in front of him to efficiently place items onto his table while sitting in his recliner.     Baseline:  08/23/2023: Pt. has difficulty consistently reaching out in front of him to place items on the on a tabletop surface with the right hand. 08/07/23: Continue 2/26/225: Continue 05/10/23: Pt. has difficulty reaching out in front of him far enough to efficiently place items on the table while sitting in a recliner chair.    Goal status:  Ongoing   ASSESSMENT:  CLINICAL IMPRESSION:   Pt. Tolerated treatment well today. Pt. Presents with increased flexor tone, limiting digit extension/flexion, elbow and wrist extension ROM. Pt. Was upgraded to blue theraband during BUE shoulder horizontal abduction exercises. Pt. Was able to grasp hold of the large pegs, however has difficulty sustaining grip on the large round circular tipped pegs during diagonal reaching patterns. Pt. required Mod A to move through elbow extension, and shoulder abduction/horizontal abduction prior to discarding the pegs. Pt. was able to perform grasping and transferring pegs from the R hand the L hand, with Mod A, stabilizing pegs while Pt. grasped pegs from the OT's hands, in preparation for placing pegs into container. Pt. continues to be highly motivated. Pt.'s caregivers continue to be very supportive to continue working towards improving BUE functioning, ROM, strength, motor control, [facility] skills, as well as visual compensatory strategies in order to maximize independence with daily ADLs, and IADL functioning.     PERFORMANCE DEFICITS in functional skills including ADLs, IADLs, coordination, dexterity, proprioception, ROM, strength, pain, FMC, GMC, decreased knowledge of use of DME, and UE functional use, cognitive skills including safety awareness, and psychosocial skills including coping strategies, environmental adaptation, habits, and routines and behaviors.   IMPAIRMENTS are limiting patient from ADLs, IADLs, leisure, and social participation.   COMORBIDITIES may have co-morbidities  that affects occupational performance. Patient will benefit from skilled OT to address above impairments and improve overall function.  MODIFICATION OR ASSISTANCE TO COMPLETE EVALUATION: Maximum or significant modification of tasks or assist is necessary to complete an evaluation.  OT OCCUPATIONAL PROFILE AND HISTORY: Comprehensive assessment: Review of records and extensive  additional review of physical, cognitive, psychosocial history related to current functional performance.  CLINICAL DECISION MAKING: High - multiple treatment options, significant modification of task necessary  REHAB POTENTIAL: Fair    EVALUATION COMPLEXITY: High    PLAN: OT FREQUENCY: 2 x's a week  OT DURATION:12 weeks  PLANNED INTERVENTIONS: self care/ADL training, therapeutic exercise, therapeutic activity, neuromuscular re-education, manual therapy, passive range of motion, patient/family education, and cognitive remediation/compensation RECOMMENDED OTHER SERVICES: PT  CONSULTED AND AGREED WITH PLAN OF CARE: Patient and family member/caregiver  PLAN FOR NEXT SESSION: Review HEP  Damien Nap, OTS   This entire session was performed under direct supervision and direction of a licensed therapist/therapist assistant . I have personally read, edited and approve of the note as written.   Richardson Otter, MS, OTR/L  10/18/2023

## 2023-10-19 NOTE — Therapy (Signed)
 OUTPATIENT PHYSICAL THERAPY TREATMENT    Patient Name: Paul Hall MRN: [id number] DOB:1996/01/28, 28 y.o., male Today's Date: 10/19/2023  PCP:  [facility] PROVIDER:  Morene Sous, PA-C   PT End of Session - 10/18/23 0817     Visit Number [id number]    Number of Visits 184   date corrected from most recent recert   Date for PT Re-Evaluation 11/27/23    Authorization Type [employer] COMM [employer]/ [place] Medicaid    Authorization Time Period 01/04/21-03/29/21; Recert 03/24/2021-06/16/2021; Recert 09/15/2021- 12/08/2021; Recert 12/13/2021-03/07/2022    Progress Note Due on Visit 170    PT Start Time 1615    PT Stop Time 1700    PT Time Calculation (min) 45 min    Equipment Utilized During Treatment Gait belt    Activity Tolerance Patient tolerated treatment well    Behavior During Therapy WFL for tasks assessed/performed                            Past Medical History:  Diagnosis Date   Diabetes mellitus (HCC)    Hypertension    Stroke (HCC)    Past Surgical History:  Procedure Laterality Date   IVC FILTER PLACEMENT ([facility] HX)     LEG SURGERY     PEG TUBE PLACEMENT     TRACHEOSTOMY     Patient Active Problem List   Diagnosis Date Noted   Sepsis due to vancomycin  resistant Enterococcus species (HCC) 06/06/2019   SIRS (systemic inflammatory response syndrome) (HCC) 06/05/2019   Acute lower UTI 06/05/2019   VRE (vancomycin -resistant Enterococci) infection 06/05/2019   Anemia 06/05/2019   Skin ulcer of sacrum with necrosis of muscle (HCC)    Urinary retention    Type 2 diabetes mellitus without complication, with long-term current use of insulin  (HCC)    Tachycardia    Lower extremity edema    Acute metabolic encephalopathy    Obstructive sleep apnea    Morbid obesity with BMI of 60.0-69.9, adult (HCC)    Goals of care, counseling/discussion    Palliative care encounter    Sepsis (HCC) 04/27/2019   H/O insulin  dependent diabetes mellitus 04/27/2019   History of  CVA with residual deficit 04/27/2019   Seizure disorder (HCC) 04/27/2019   Decubitus ulcer of sacral region, stage 4 (HCC) 04/27/2019    REFERRING DIAG: Cerebral infarction, unspecified   THERAPY DIAG:  Muscle weakness (generalized)  Other lack of coordination  Difficulty in walking, not elsewhere classified  Abnormality of gait and mobility  Unsteadiness on feet  Other abnormalities of gait and mobility  History of CVA with residual deficit  Rationale for Evaluation and Treatment Rehabilitation  PERTINENT HISTORY: Paul Hall is a 26yoM who presents with severe weakness, quadriparesis, altered sensorium, and visual impairment s/p critical illness and prolonged hospitalization. Pt hospitalized in October 2020 with ARDS 2/2 COVID19 infection. Pt sustained a complex and lengthy hospitalization which included tracheostomy, prolonged sedation, ECMO. In this period pt sustained CVA and SDH. Pt has now been liberated from tracheostomy and G-tube. Pt has since been hospitalized for wound infection and UTI. Pt lives with parents at home, has hospital bed and left chair, hoyer lift transfers, and power WC for mobility needs. Pt needs heavy physical assistance with ADL 2/2 BUE contractures and motor dysfunction   PRECAUTIONS: Fall  SUBJECTIVE:  Patient reports He was not sore and did really well after last visit.   PAIN:  Are you having pain? No.  But feeling tight.    TODAY'S TREATMENT:  - 10/18/2023          TE:  Manual stretching to B LE and low back today in supine with modified- wedge and 2 pillows under patient - Single knee to chest -hold 30 sec x2 -Lower trunk rotation - hold 45 sec x 2 -B hamstring stretch- hold 45 sec x 2  - B hip ER/IR- hold 30 sec x 2  - B hip add/groin stretch hold 30 sec x 2 - Chest stretch- instructed to relax RUE (increased flexor tone) and able to lay arm straight on table with stretch.  -Chest press (active) x 15 reps  -Heel slides (AAROM on  RLE) x 15 reps -Manual resistive leg press (LLE) x 15 reps  -Hip IR (hooklye) x 15 reps ea LE -SAQ x 15 reps ea LE -Bridging x 15 (initial assist to position RLE then he was able to maintain   PATIENT EDUCATION: Education details: Exercise technique with VC   HOME EXERCISE PROGRAM:  Access Code: [email or web address] URL: [email or web address] Date: 03/23/2022 Prepared by: Reyes London  Exercises - Supine Bridge  - 3 x weekly - 3 sets - 10 reps - 2 hold - Supine Gluteal Sets  - 3 x weekly - 3 sets - 10 reps - 5 sec hold - Supine Quad Set  - 1 x daily - 3 x weekly - 3 sets - 10 reps - 5 hold - Seated Long Arc Quad  - 1 x daily - 7 x weekly - 3 sets - 10 reps - Seated Hip Adduction Squeeze with Ball  - 1 x daily - 3 x weekly - 3 sets - 10 reps - 5 hold - Seated Hip Abduction  - 1 x daily - 3 x weekly - 3 sets - 10 reps - 2 hold    PT Short Term Goals -       PT SHORT TERM GOAL #1   Title Pt will be independent with HEP in order to improve strength and balance in order to decrease fall risk and improve function at home and work.    Baseline 01/04/2021= No formal HEP in place; 12/12 no HEP in place; 05/10/2021-Patient and his father were able to report compliance with curent HEP consisting of mostly seated/reclined LE strengthening. Both verbalize no questions at this time.    Time 6    Period Weeks    Status Achieved    Target Date 02/15/21            PT LONG TERM GOAL #1     Title Patient will increase BLE gross strength by 1/2 muscle grade to improve functional strength for improved independence with potential gait, increased standing tolerance and increased ADL ability.     Baseline 01/04/2021- Patient presents with 1/5 to 3-/5 B LE strength with MMT; 12/12: goal partially met for Left knee/hip; 05/10/2021= 2-/5 bilateal Hip flex; 3+/5 bilateral Knee ext; 06/21/2021= Patient presents with 2-/5 bilateral Hip flex; 3+/5 bilateral knee ext/flex; 2-/5 left ankle DF; 0/5 right  ankle- and able to increase reps and resistance with LE's. 09/15/2021- Patient technically presents with 2-/5 B hip flex/abd/add - but he is able to raise his hip up to approx 100 deg which has improved. 3+/5 Bilateral knee ext, 2-/5 left ankle and 0/5 right ankle.  12/08/2021= Patient able to lift left knee at 110 deg of hip flex; presents with 3+/5 knee ext, 2-/5 left ankle DF and 0/5 right ankle DF,  2-/5 bilateral Hip abd in seated position.    12/6: R: knee 3+/5 ext, 2/5 flexion, left knee 3+/5 extension, 3+/5 flexion, R hip: 2+/5 hip add, 2+/5 hip ABD L hip: 4-/5 hip ABD, 3+/5 hip ADD, 3+/5 hip flexion; 06/06/2022= Patient now presents with 2-/5 right ankle DF/PF;     Time 12     Period Weeks     Status MET    Target Date 03/07/2022         PT LONG TERM GOAL #2    Title Patient will tolerate sitting unsupported demonstrating erect sitting posture for 15 minutes with CGA to demonstrate improved back extensor strength and improved sitting tolerance.     Baseline 01/04/2021- Patient confied to sitting in lift chair or electric power chair with back support and unable to sit upright without physical assistance; 12/12: tolerates <1 minutes upright unsupported sitting. 05/10/2021=static sit with forward trunk lean  in his power wheelchair without back support x approx 3 min. 06/21/2021=Unable to assess today due to patient with acute back pain but on previous visit able to sit x 8 min without back support. 09/15/2021- on last visit- 09/13/2021- patient was able to sit unsupported x 8 min at edge of mat. 10/13/2023 - Patient was able to sit at edge of mat with varying level of assist today from SBA to min A for a total of 20 min. 12/13/2021= Patient demonstrated unsupported sitting at edge of mat for approx 20 min    Time 12     Period Weeks     Status GOAL MET    Target Date 12/08/21          PT LONG TERM GOAL #3    Title Patient will demonstrate ability to perform static standing in // bars > 2 min with Max  Assist  without loss of balance and fair posture for improved overall strength for pre-gait and transfer activities.     Baseline 01/04/2021= Patient current uanble to stand- Dependent on hoyer or sit to stand lift for transfers. 05/10/2021=Not appropriate yet- Currently still dependent with all transfers using hoyer. 06/21/2021= Patient continuing now to focus on LE strengthening to prepare for standing-unable to try today due to acute low back pain-  planning on attempting in new cert period. 09/15/2021- Patient has attempted standing 2x in past two week- max Assist of 2 people - only once was he successful to clearing his bottom from chair - Will continue to be a focus during the new certification. 12/13/2021= Patient has been limited secondary to increased overall low back pain during this certification and will require more time to focus on this goal.  12/6: not assessed this date, will assess at date when 2-3 PTs are present for assistance     Time 12     Period Weeks     Status GOAL not appropriate at this time - may attempt in future once Patient presents with improved overall LE strength.                 PT LONG TERM GOAL #4    Title Pt will improve FOTO score by 10 points or more demonstrating improved perceived functional ability     Baseline FOTO 7 on 10/17; 03/15/21: FOTO 12; 05/10/2021 06/21/2021= 1; 09/15/2021= 9; 12/13/2021= Will issue next visit 12/6: 4; 06/06/2022= Will assess next visit; Goal not appropriate as patient is not ambulatory.     Time 12     Period Weeks  Status WITHDRAWN    Target date 11/30/2022         PT LONG TERM GOAL #5    Title Patient will perform sit to stand transfer with appropriate AD and max assist of 2 people with 75% consistency to prepare for pregait activities.     Baseline 09/15/2021= Patient unable to stand well- unable to clear his bottom off chair with Max assist of 2 persons. 12/13/2021- Goal not appropriate to try yet but will keep and roll over to next cert  as shift continues to focus on transfers/standing; 4/24= Patient able to perform active ankle DF/PF with right LE and able to raise his knee into seated march and clear floor without physical assist today - as previously unable as well as lift right knee ext to near full ROM to improve strength for eventual standing. 09/07/2022- Goal continues to not be appropriate but patient is now standing some and will keep goal active; 11/23/2022= patient is now performing sit to stand with max A +2 and now able to clear his bottom off mat. 11/30/2022- Patient continues to perform sit to stand transfers with Max A+2 and able to clear bottom well off mat but not erect yet. 05/31/2023- Patient is able to stand consistently with max A +2 - unable to achieve full knee or trunk ext yet is able to clear bottom off mat for improved LE weightbearing. 09/04/2023- Patient performed several Sit to stand requiring max A+2  with improving posture- able to ext R knee better - near terminal knee and able to clear bottom off mat- still not full but able to contract glutes for standing as erect as possible.     Time 12     Period Weeks     Status PROGRESSING    Target date  11/27/2023       PT LONG TERM GOAL #6  Title Patient will tolerate sitting unsupported demonstrating erect sitting posture for 30 minutes with CGA to demonstrate improved back extensor strength and improved sitting tolerance.   Baseline 12/13/2021= Patient demonstrated unsupported sitting at edge of mat for approx 20 min;  12/29/2021- Patient performed approx 30 min of dynamic sitting activities today. 06/06/2022= Patient demonstrated ability to sit and perform static and dynamic UE/LE movement with only Supervision.   Time 12   Period Weeks   Status GOAL MET           7.  Patient will tolerate 2 minutes or more of standing in sit to stand lift or with max assist +2 in order to indicate improved lower extremity weightbearing tolerance for progression to standing in  parallel bars. Baseline: 1 minute on most recent stand 02/21/22; 06/06/2022- Patient did attempt today after complaining of right LE pain last week. He attempted 3 stands using sit to stand lift. - all over 1 min- last one approx 48 sec- stopped due to fatigue. More erect standing in lift today - still poor gluteal strength but able to activate glutes and extend hips upon command but unable to hold > 5 sec. 07/28/2022- Will attempt standing next visit but patient has been able to stand since last progress note for up to 3 min at a time at his best. 09/07/2022-  attempted standing with max +2 and patient able to stand 15-20 sec so will now  revise goal; 11/23/2022- Patient at his best between last 2 visits was able to stand approx 50 sec with max A+2.  11/30/2022- Patient performed 4 trials of static  standing - max A +2- clearing bottom and using forearms to bear weight  03/15/2023: Patient performed 2 trials of static standing - max A +2- clearing edge of mat and using forearms to bear weight for approximately 45 seconds, is able to come into approximately 45 degrees of knee flexion from sit to stand; 05/31/2023- Patient has not attempted standing in 1 month but able to perform x 4 trials with max A +2 (PT and his mom) and able to clear his bottom off mat and attempted to activate his glutes and apply some forearm pressure - able to stand between 25-40 sec today. 09/04/2023- Patient performed 3 trials of standing ranging from 17 -47 sec - improving right knee terminal knee ext and more activation upon command with gluteals- able to clear mat table. Continued to require Max A+2 and blocking knees with forearm/trunk support.   Goal status: PROGRESSING Target date: 11/27/2023   8. Patient will tolerate sitting unsupported demonstrating ability to perform dynamic UE/LE activities for 30 minutes  independently to demonstrate ability to sit at edge of bed to eat or perform some ADL's/exercise for optimal quality of  life.             Baseline: 06/06/2022- Patient able to sit and perform some UE/LE exercises but requires CGA at times for safety- he is able to static sit for 30 min with supervision. 07/27/2022= Patient able to now sit > 30 min with Supervision only - performing static and dynamic activities. Will keep goal active to ensure patient is consistent. 09/07/2022=  Patient able to demonstrate consistent ability to sit at edge of mat during visits             Goal Status: MET             Target date: 08/29/2022  9.  Patient will perform rolling left to right with Mod assist for improved independence with bed mobility to assist with dressing and positioning in bed. Baseline: 09/04/2023- Mother reported last visit that he was having some difficulty with assisting with bed mobility. Goal status: NEW Target date: 11/27/2023    10. Patient will perform active bridging -able to clear bottom   Off mat for 10 reps with PT/caregiver holding his feet    For improved trunk ext strength with standing and to    To assist with be mobility/positioning.  Baseline: 09/04/2023= (PT assisted- holding right LE In neutral and pressure on each foot) - Difficulty clearing bottom off mat. Goal status: NEW Target date: 11/27/2023           Plan     Clinical Impression Statement  Treatment continued with some lower body stretching then progressed to some therex (LE strengthening) and patient performed well- able to hold his R Leg in place to perform bridge today as previously unable and performed well with leg press with some manual resistance.  Pt will continue to benefit from skilled physical therapy intervention to address impairments, improve QOL, and attain therapy goals.    Personal Factors and Comorbidities Comorbidity 3+;Time since onset of injury/illness/exacerbation    Comorbidities CVA, diabetes, Seizures    Examination-Activity Limitations Bathing;Bed Mobility;Bend;Caring for  Others;Carry;Dressing;Hygiene/Grooming;Lift;Locomotion Level;Reach Overhead;Self Feeding;Sit;Squat;Stairs;Stand;Transfers;Toileting    Examination-Participation Restrictions Cleaning;Community Activity;Driving;Laundry;Medication Management;Meal Prep;Occupation;Personal Finances;Shop;Yard Work;Volunteer    Stability/Clinical Decision Making Evolving/Moderate complexity    Rehab Potential Fair    PT Frequency 1-2x / week    PT Duration 12 weeks    PT Treatment/Interventions ADLs/Self Care Home Management;Cryotherapy;Electrical Stimulation;Moist Heat;Ultrasound;DME Instruction;Gait  training;Stair training;Functional mobility training;Therapeutic exercise;Balance training;Patient/family education;Orthotic Fit/Training;Neuromuscular re-education;Wheelchair mobility training;Manual techniques;Passive range of motion;Dry needling;Energy conservation;Taping;Visual/perceptual remediation/compensation;Joint Manipulations    PT Next Visit Plan core strength/motor control in short-sitting, sit to stand if able; Continue with progressive LE Strengthening. Stand activities as appropriate.    PT Home Exercise Plan No changes to HEP today    Consulted and Agree with Plan of Care Patient;Family member/caregiver    Family Member Consulted             03/09/2023, 8:04 AM      8:29 AM, 10/19/23 Reyes LOISE London PT Physical Therapist - [facility]  8:29 AM 10/19/23

## 2023-10-23 ENCOUNTER — Ambulatory Visit: Payer: Medicare Other | Admitting: Occupational Therapy

## 2023-10-23 ENCOUNTER — Ambulatory Visit: Payer: Medicare Other

## 2023-10-23 DIAGNOSIS — R2689 Other abnormalities of gait and mobility: Secondary | ICD-10-CM | POA: Diagnosis not present

## 2023-10-23 DIAGNOSIS — M6281 Muscle weakness (generalized): Secondary | ICD-10-CM | POA: Diagnosis not present

## 2023-10-23 DIAGNOSIS — I6930 Unspecified sequelae of cerebral infarction: Secondary | ICD-10-CM

## 2023-10-23 DIAGNOSIS — R262 Difficulty in walking, not elsewhere classified: Secondary | ICD-10-CM | POA: Diagnosis not present

## 2023-10-23 DIAGNOSIS — R269 Unspecified abnormalities of gait and mobility: Secondary | ICD-10-CM | POA: Diagnosis not present

## 2023-10-23 DIAGNOSIS — R278 Other lack of coordination: Secondary | ICD-10-CM | POA: Diagnosis not present

## 2023-10-23 DIAGNOSIS — R2681 Unsteadiness on feet: Secondary | ICD-10-CM

## 2023-10-23 NOTE — Therapy (Signed)
 OUTPATIENT PHYSICAL THERAPY TREATMENT    Patient Name: Paul Hall MRN: [id number] DOB:[birth year], 28 y.o., male Today's Date: 10/23/2023  PCP:  [facility] PROVIDER:  Morene Sous, PA-C   PT End of Session - 10/23/23 1636     Visit Number 168    Number of Visits 184   date corrected from most recent recert   Date for PT Re-Evaluation 11/27/23    Authorization Type [employer]/ [place] Medicaid    Authorization Time Period 01/04/21-03/29/21; Recert 03/24/2021-06/16/2021; Recert 09/15/2021- 12/08/2021; Recert 12/13/2021-03/07/2022    Progress Note Due on Visit 170    PT Start Time 1533    PT Stop Time 1614    PT Time Calculation (min) 41 min    Equipment Utilized During Treatment Gait belt    Activity Tolerance Patient tolerated treatment well    Behavior During Therapy WFL for tasks assessed/performed                             Past Medical History:  Diagnosis Date   Diabetes mellitus (HCC)    Hypertension    Stroke (HCC)    Past Surgical History:  Procedure Laterality Date   IVC FILTER PLACEMENT ([facility] HX)     LEG SURGERY     PEG TUBE PLACEMENT     TRACHEOSTOMY     Patient Active Problem List   Diagnosis Date Noted   Sepsis due to vancomycin  resistant Enterococcus species (HCC) 06/06/2019   SIRS (systemic inflammatory response syndrome) (HCC) 06/05/2019   Acute lower UTI 06/05/2019   VRE (vancomycin -resistant Enterococci) infection 06/05/2019   Anemia 06/05/2019   Skin ulcer of sacrum with necrosis of muscle (HCC)    Urinary retention    Type 2 diabetes mellitus without complication, with long-term current use of insulin  (HCC)    Tachycardia    Lower extremity edema    Acute metabolic encephalopathy    Obstructive sleep apnea    Morbid obesity with BMI of 60.0-69.9, adult (HCC)    Goals of care, counseling/discussion    Palliative care encounter    Sepsis (HCC) 04/27/2019   H/O insulin  dependent diabetes mellitus 04/27/2019   History  of CVA with residual deficit 04/27/2019   Seizure disorder (HCC) 04/27/2019   Decubitus ulcer of sacral region, stage 4 (HCC) 04/27/2019    REFERRING DIAG: Cerebral infarction, unspecified   THERAPY DIAG:  Muscle weakness (generalized)  Other lack of coordination  Difficulty in walking, not elsewhere classified  Unsteadiness on feet  History of CVA with residual deficit  Rationale for Evaluation and Treatment Rehabilitation  PERTINENT HISTORY: Paul Hall is a 26yoM who presents with severe weakness, quadriparesis, altered sensorium, and visual impairment s/p critical illness and prolonged hospitalization. Pt hospitalized in October 2020 with ARDS 2/2 COVID19 infection. Pt sustained a complex and lengthy hospitalization which included tracheostomy, prolonged sedation, ECMO. In this period pt sustained CVA and SDH. Pt has now been liberated from tracheostomy and G-tube. Pt has since been hospitalized for wound infection and UTI. Pt lives with parents at home, has hospital bed and left chair, hoyer lift transfers, and power WC for mobility needs. Pt needs heavy physical assistance with ADL 2/2 BUE contractures and motor dysfunction   PRECAUTIONS: Fall  SUBJECTIVE:  Patient reports stomach has been upset this afternoon.    PAIN:  Are you having pain? No. But feeling tight.    TODAY'S TREATMENT:  - 10/23/2023       *  deferred transfer to mat as patient reported stomach actively hurting at this time.   TE:  Manual stretching to B LE and low back today in Seated position: -Single knee to chest -hold 30 sec x2 -B hamstring stretch- hold 45 sec x 4  - B hip ER/IR- hold 30 sec x 2  -B gastroc stretch x 30 sec x 2  - B hip add/groin stretch hold 30 sec x 2   -Hip IR (hooklye) x 15 reps ea LE -LAQ 2 x 15 with 2 sec hold and slow eccentric lowering (2# on RLE/2.5 on RLE)    PATIENT EDUCATION: Education details: Exercise technique with VC   HOME EXERCISE PROGRAM:  Access  Code: [email or web address] URL: [email or web address] Date: 03/23/2022 Prepared by: Paul Hall  Exercises - Supine Bridge  - 3 x weekly - 3 sets - 10 reps - 2 hold - Supine Gluteal Sets  - 3 x weekly - 3 sets - 10 reps - 5 sec hold - Supine Quad Set  - 1 x daily - 3 x weekly - 3 sets - 10 reps - 5 hold - Seated Long Arc Quad  - 1 x daily - 7 x weekly - 3 sets - 10 reps - Seated Hip Adduction Squeeze with Ball  - 1 x daily - 3 x weekly - 3 sets - 10 reps - 5 hold - Seated Hip Abduction  - 1 x daily - 3 x weekly - 3 sets - 10 reps - 2 hold    PT Short Term Goals -       PT SHORT TERM GOAL #1   Title Pt will be independent with HEP in order to improve strength and balance in order to decrease fall risk and improve function at home and work.    Baseline 01/04/2021= No formal HEP in place; 12/12 no HEP in place; 05/10/2021-Patient and his father were able to report compliance with curent HEP consisting of mostly seated/reclined LE strengthening. Both verbalize no questions at this time.    Time 6    Period Weeks    Status Achieved    Target Date 02/15/21            PT LONG TERM GOAL #1     Title Patient will increase BLE gross strength by 1/2 muscle grade to improve functional strength for improved independence with potential gait, increased standing tolerance and increased ADL ability.     Baseline 01/04/2021- Patient presents with 1/5 to 3-/5 B LE strength with MMT; 12/12: goal partially met for Left knee/hip; 05/10/2021= 2-/5 bilateal Hip flex; 3+/5 bilateral Knee ext; 06/21/2021= Patient presents with 2-/5 bilateral Hip flex; 3+/5 bilateral knee ext/flex; 2-/5 left ankle DF; 0/5 right ankle- and able to increase reps and resistance with LE's. 09/15/2021- Patient technically presents with 2-/5 B hip flex/abd/add - but he is able to raise his hip up to approx 100 deg which has improved. 3+/5 Bilateral knee ext, 2-/5 left ankle and 0/5 right ankle.  12/08/2021= Patient able to lift left  knee at 110 deg of hip flex; presents with 3+/5 knee ext, 2-/5 left ankle DF and 0/5 right ankle DF, 2-/5 bilateral Hip abd in seated position.    12/6: R: knee 3+/5 ext, 2/5 flexion, left knee 3+/5 extension, 3+/5 flexion, R hip: 2+/5 hip add, 2+/5 hip ABD L hip: 4-/5 hip ABD, 3+/5 hip ADD, 3+/5 hip flexion; 06/06/2022= Patient now presents with 2-/5 right ankle DF/PF;     Time 12  Period Weeks     Status MET    Target Date 03/07/2022         PT LONG TERM GOAL #2    Title Patient will tolerate sitting unsupported demonstrating erect sitting posture for 15 minutes with CGA to demonstrate improved back extensor strength and improved sitting tolerance.     Baseline 01/04/2021- Patient confied to sitting in lift chair or electric power chair with back support and unable to sit upright without physical assistance; 12/12: tolerates <1 minutes upright unsupported sitting. 05/10/2021=static sit with forward trunk lean  in his power wheelchair without back support x approx 3 min. 06/21/2021=Unable to assess today due to patient with acute back pain but on previous visit able to sit x 8 min without back support. 09/15/2021- on last visit- 09/13/2021- patient was able to sit unsupported x 8 min at edge of mat. 10/13/2023 - Patient was able to sit at edge of mat with varying level of assist today from SBA to min A for a total of 20 min. 12/13/2021= Patient demonstrated unsupported sitting at edge of mat for approx 20 min    Time 12     Period Weeks     Status GOAL MET    Target Date 12/08/21          PT LONG TERM GOAL #3    Title Patient will demonstrate ability to perform static standing in // bars > 2 min with Max Assist  without loss of balance and fair posture for improved overall strength for pre-gait and transfer activities.     Baseline 01/04/2021= Patient current uanble to stand- Dependent on hoyer or sit to stand lift for transfers. 05/10/2021=Not appropriate yet- Currently still dependent with all transfers  using hoyer. 06/21/2021= Patient continuing now to focus on LE strengthening to prepare for standing-unable to try today due to acute low back pain-  planning on attempting in new cert period. 09/15/2021- Patient has attempted standing 2x in past two week- max Assist of 2 people - only once was he successful to clearing his bottom from chair - Will continue to be a focus during the new certification. 12/13/2021= Patient has been limited secondary to increased overall low back pain during this certification and will require more time to focus on this goal.  12/6: not assessed this date, will assess at date when 2-3 PTs are present for assistance     Time 12     Period Weeks     Status GOAL not appropriate at this time - may attempt in future once Patient presents with improved overall LE strength.                 PT LONG TERM GOAL #4    Title Pt will improve FOTO score by 10 points or more demonstrating improved perceived functional ability     Baseline FOTO 7 on 10/17; 03/15/21: FOTO 12; 05/10/2021 06/21/2021= 1; 09/15/2021= 9; 12/13/2021= Will issue next visit 12/6: 4; 06/06/2022= Will assess next visit; Goal not appropriate as patient is not ambulatory.     Time 12     Period Weeks     Status WITHDRAWN    Target date 11/30/2022         PT LONG TERM GOAL #5    Title Patient will perform sit to stand transfer with appropriate AD and max assist of 2 people with 75% consistency to prepare for pregait activities.     Baseline 09/15/2021= Patient unable to stand well- unable  to clear his bottom off chair with Max assist of 2 persons. 12/13/2021- Goal not appropriate to try yet but will keep and roll over to next cert as shift continues to focus on transfers/standing; 4/24= Patient able to perform active ankle DF/PF with right LE and able to raise his knee into seated march and clear floor without physical assist today - as previously unable as well as lift right knee ext to near full ROM to improve strength for  eventual standing. 09/07/2022- Goal continues to not be appropriate but patient is now standing some and will keep goal active; 11/23/2022= patient is now performing sit to stand with max A +2 and now able to clear his bottom off mat. 11/30/2022- Patient continues to perform sit to stand transfers with Max A+2 and able to clear bottom well off mat but not erect yet. 05/31/2023- Patient is able to stand consistently with max A +2 - unable to achieve full knee or trunk ext yet is able to clear bottom off mat for improved LE weightbearing. 09/04/2023- Patient performed several Sit to stand requiring max A+2  with improving posture- able to ext R knee better - near terminal knee and able to clear bottom off mat- still not full but able to contract glutes for standing as erect as possible.     Time 12     Period Weeks     Status PROGRESSING    Target date  11/27/2023       PT LONG TERM GOAL #6  Title Patient will tolerate sitting unsupported demonstrating erect sitting posture for 30 minutes with CGA to demonstrate improved back extensor strength and improved sitting tolerance.   Baseline 12/13/2021= Patient demonstrated unsupported sitting at edge of mat for approx 20 min;  12/29/2021- Patient performed approx 30 min of dynamic sitting activities today. 06/06/2022= Patient demonstrated ability to sit and perform static and dynamic UE/LE movement with only Supervision.   Time 12   Period Weeks   Status GOAL MET           7.  Patient will tolerate 2 minutes or more of standing in sit to stand lift or with max assist +2 in order to indicate improved lower extremity weightbearing tolerance for progression to standing in parallel bars. Baseline: 1 minute on most recent stand 02/21/22; 06/06/2022- Patient did attempt today after complaining of right LE pain last week. He attempted 3 stands using sit to stand lift. - all over 1 min- last one approx 48 sec- stopped due to fatigue. More erect standing in lift today -  still poor gluteal strength but able to activate glutes and extend hips upon command but unable to hold > 5 sec. 07/28/2022- Will attempt standing next visit but patient has been able to stand since last progress note for up to 3 min at a time at his best. 09/07/2022-  attempted standing with max +2 and patient able to stand 15-20 sec so will now  revise goal; 11/23/2022- Patient at his best between last 2 visits was able to stand approx 50 sec with max A+2.  11/30/2022- Patient performed 4 trials of static standing - max A +2- clearing bottom and using forearms to bear weight  03/15/2023: Patient performed 2 trials of static standing - max A +2- clearing edge of mat and using forearms to bear weight for approximately 45 seconds, is able to come into approximately 45 degrees of knee flexion from sit to stand; 05/31/2023- Patient has not attempted standing  in 1 month but able to perform x 4 trials with max A +2 (PT and his mom) and able to clear his bottom off mat and attempted to activate his glutes and apply some forearm pressure - able to stand between 25-40 sec today. 09/04/2023- Patient performed 3 trials of standing ranging from 17 -47 sec - improving right knee terminal knee ext and more activation upon command with gluteals- able to clear mat table. Continued to require Max A+2 and blocking knees with forearm/trunk support.   Goal status: PROGRESSING Target date: 11/27/2023   8. Patient will tolerate sitting unsupported demonstrating ability to perform dynamic UE/LE activities for 30 minutes  independently to demonstrate ability to sit at edge of bed to eat or perform some ADL's/exercise for optimal quality of life.             Baseline: 06/06/2022- Patient able to sit and perform some UE/LE exercises but requires CGA at times for safety- he is able to static sit for 30 min with supervision. 07/27/2022= Patient able to now sit > 30 min with Supervision only - performing static and dynamic activities. Will keep goal  active to ensure patient is consistent. 09/07/2022=  Patient able to demonstrate consistent ability to sit at edge of mat during visits             Goal Status: MET             Target date: 08/29/2022  9.  Patient will perform rolling left to right with Mod assist for improved independence with bed mobility to assist with dressing and positioning in bed. Baseline: 09/04/2023- Mother reported last visit that he was having some difficulty with assisting with bed mobility. Goal status: NEW Target date: 11/27/2023    10. Patient will perform active bridging -able to clear bottom   Off mat for 10 reps with PT/caregiver holding his feet    For improved trunk ext strength with standing and to    To assist with be mobility/positioning.  Baseline: 09/04/2023= (PT assisted- holding right LE In neutral and pressure on each foot) - Difficulty clearing bottom off mat. Goal status: NEW Target date: 11/27/2023           Plan     Clinical Impression Statement  Treatment limited today as patient reported that his stomach was upset due to something he ate earlier. He presented with some muscle twitching with passive stretching but no reported pain. He was able to demonstrate improving R quad activity and able to even perform with resistance with some eccentric control.   Pt will continue to benefit from skilled physical therapy intervention to address impairments, improve QOL, and attain therapy goals.    Personal Factors and Comorbidities Comorbidity 3+;Time since onset of injury/illness/exacerbation    Comorbidities CVA, diabetes, Seizures    Examination-Activity Limitations Bathing;Bed Mobility;Bend;Caring for Others;Carry;Dressing;Hygiene/Grooming;Lift;Locomotion Level;Reach Overhead;Self Feeding;Sit;Squat;Stairs;Stand;Transfers;Toileting    Examination-Participation Restrictions Cleaning;Community Activity;Driving;Laundry;Medication Management;Meal Prep;Occupation;Personal Finances;Shop;Yard Work;Volunteer     Stability/Clinical Decision Making Evolving/Moderate complexity    Rehab Potential Fair    PT Frequency 1-2x / week    PT Duration 12 weeks    PT Treatment/Interventions ADLs/Self Care Home Management;Cryotherapy;Electrical Stimulation;Moist Heat;Ultrasound;DME Instruction;Gait training;Stair training;Functional mobility training;Therapeutic exercise;Balance training;Patient/family education;Orthotic Fit/Training;Neuromuscular re-education;Wheelchair mobility training;Manual techniques;Passive range of motion;Dry needling;Energy conservation;Taping;Visual/perceptual remediation/compensation;Joint Manipulations    PT Next Visit Plan core strength/motor control in short-sitting, sit to stand if able; Continue with progressive LE Strengthening. Stand activities as appropriate.    PT Home Exercise Plan No  changes to HEP today    Consulted and Agree with Plan of Care Patient;Family member/caregiver    Family Member Consulted             03/09/2023, 8:04 AM      4:52 PM, 10/23/23 Paul LOISE Hall PT Physical Therapist - [facility]  4:52 PM 10/23/23

## 2023-10-23 NOTE — Therapy (Addendum)
 . Occupational Therapy Progress/Recertification Note  Dates of reporting period  08/23/23   to   10/23/23   Patient Name: Paul Hall MRN: [id number] DOB:1995/10/01, 28 y.o., male Today's Date: 10/24/2023  PCP: Dr. Bertell REFERRING PROVIDER: Dr. Bertell   .  OT End of Session - 10/24/23 0900     Visit Number 100    Number of Visits 180    Date for OT Re-Evaluation 01/15/24    OT Start Time 1620    OT Stop Time 1705    OT Time Calculation (min) 45 min    Equipment Utilized During Treatment tilt in space power wc    Activity Tolerance Patient tolerated treatment well    Behavior During Therapy WFL for tasks assessed/performed                         Past Medical History:  Diagnosis Date   Diabetes mellitus (HCC)    Hypertension    Stroke [facility])    Past Surgical History:  Procedure Laterality Date   IVC FILTER PLACEMENT ([facility] HX)     LEG SURGERY     PEG TUBE PLACEMENT     TRACHEOSTOMY     Patient Active Problem List   Diagnosis Date Noted   Sepsis due to vancomycin  resistant Enterococcus species (HCC) 06/06/2019   SIRS (systemic inflammatory response syndrome) (HCC) 06/05/2019   Acute lower UTI 06/05/2019   VRE (vancomycin -resistant Enterococci) infection 06/05/2019   Anemia 06/05/2019   Skin ulcer of sacrum with necrosis of muscle (HCC)    Urinary retention    Type 2 diabetes mellitus without complication, with long-term current use of insulin  (HCC)    Tachycardia    Lower extremity edema    Acute metabolic encephalopathy    Obstructive sleep apnea    Morbid obesity with BMI of 60.0-69.9, adult (HCC)    Goals of care, counseling/discussion    Palliative care encounter    Sepsis (HCC) 04/27/2019   H/O insulin  dependent diabetes mellitus 04/27/2019   History of CVA with residual deficit 04/27/2019   Seizure disorder (HCC) 04/27/2019   Decubitus ulcer of sacral region, stage 4 (HCC) 04/27/2019   ONSET DATE: 01/2019  REFERRING DIAG:  CVA/COVID-19  THERAPY DIAG:  Muscle weakness (generalized)  Rationale for Evaluation and Treatment Rehabilitation  SUBJECTIVE:  Pt. reports  having an upset stomach today due to having to rush through his meal to meet transportation services for therapy.  SUBJECTIVE STATEMENT:  Pt.  reports doing well today  Pt accompanied by: self and family member  PERTINENT HISTORY:  Pt. is a 28 y.o. male who was diagnosed with COVID-19, and CVA with resultant quadriplegia. Pt. Was then hospitalized with VRE UTI. PMHx includes: urinary retention, seizure disorder, obstructive sleep disorder, DM Type II, Morbid obesity.   PRECAUTIONS: None  WEIGHT BEARING RESTRICTIONS No  PAIN:  No reports of pain Are you having pain? no  FALLS: Has patient fallen in last 6 months? No  LIVING ENVIRONMENT: Lives with: lives with their family Lives in: House/apartment Stairs: No Level Entry Has following equipment at home: Wheelchair (power) and hoyer lift, sit to stand lift  PLOF: Independent  PATIENT GOALS: To be able to engage in more daily care tasks.  OBJECTIVE:   HAND DOMINANCE: Right  ADLs:  Transfers/ambulation related to ADLs: Eating: Pt. reports being able to hold standard utensils, and is starting to engage more in self-feeding tasks, hand to mouth patterns. Pt. Reports that  he does as much as he can with the task, and family assists  with the remainder of the task. Grooming: Pt. is able to initiate holding an electric toothbrush, and brush his teeth. Family assists LB Dressing: Total Assist UE dressing: Pt. is now able to reach up to actively assist with grasping , and pulling his gown down. Toileting:  Total Assist Bathing: MaxA UB, Total assist LB Tub Shower transfers: N/A Equipment: See above    IADLs: Shopping: Relies on family to assist Light housekeeping: Total Assist Meal Prep: Total Assist Community mobility:   Medication management:  Total Assist  Financial management:  N/A Handwriting: Not legible: Pt. Is able to hold a pen with the left hand, and initiate marking the page. Pt.'s eye glasses were not available  MOBILITY STATUS:  Power w/c  POSTURE COMMENTS:  Pt. Requires position changes in his power w/c  ACTIVITY TOLERANCE: Activity tolerance:  Fair  FUNCTIONAL OUTCOME MEASURES: FOTO: 40 TR score: 45  05/25/2022:   FOTO: 50 TR score: 45  07/20/22: FOTO 55  03/08/23: 51  UPPER EXTREMITY ROM     Active ROM Left eval Left  03/21/2022 Left 05/25/2022 L  07/20/22 Left 07/25/2022 Left  08/15/2022 Left  09/26/2022 Left  11/16/2022 Left 12/28/2022 Left 03/13/23 Left 05/10/23 Left 10/23/23  Shoulder flexion 119 123 125 130  136 138 130 142 144 144 148  Shoulder abduction 110 115 115 115  118 121 125 142 140 140 140  Shoulder adduction              Shoulder extension              Shoulder internal rotation              Shoulder external rotation              Elbow flexion 135(135) 145 145 145  145    145 145   Elbow extension -27(-18) -21(-20) -20(-16)  -25(-10) -23(-10) -21(-10) -20(-18) -20(-18) -20(-17) -17(-16) -12(-8)  Wrist flexion              Wrist extension 10(50) 19(50) 28(50)  30(50) 35(55) 36(55) 36(60) 40(60) 45(60) 45(60) 42(54)  Wrist ulnar deviation              Wrist radial deviation              Wrist pronation            60(74)  Wrist supination   Limited by flexor tone       10(15)  30(38)  (Blank rows = not tested)    Active ROM Right eval Right 03/21/2022 Right 05/25/2022 R  07/20/22 Right  07/25/22 Right 08/15/2022 Right 09/26/2022 Right 11/16/2022 Right 12/28/2022 Right 03/13/23 Right 05/10/23 Right 10/23/23  Shoulder flexion 106 scaption 105  Scaption 62 flexion 110 scaption 100  105 105 105 112 Scaption 123 Scaption 122  Scaption 142 Scaption  Shoulder abduction 114 90 97 100  105 117 120 124 124 122 144  Shoulder adduction              Shoulder extension              Shoulder internal rotation               Shoulder external rotation              Elbow flexion 120(130) 145 145 140  144 140 142 148 148 145   Elbow extension -45(-35) -  22(-35) -28(-22)  -32(-21) -32(-28) -28(-26) -28(-28) -27(-26) -27(-40) -38(-35) -34(-26)  Wrist flexion              Wrist extension -30(10) -25(10) -10(30)  -10(20) -10(25) -20(40) -30(10) -22(34) -22(35) -32(22) -30(-8)  Wrist ulnar deviation              Wrist radial deviation              Wrist pronation              Wrist supination   Limited by flexor tone                12/28/2022: Thumb radial abduction: Right: 12(46), Left: 40(54)  Digits: Bilateral 2nd through 5th digit: MP Hyperextension with PIP, an DIP flexion    10/23/23:  Bilateral 2nd through 5th digit MP hyperextension with PIP, and DIP flexion: Pt. has been intermittently able to initate active diigt PIP, and DIP extension following Botox during this past recertification/progress reporting period.     UPPER EXTREMITY MMT:     Left eval Left 03/21/2022 Left  05/25/2022 L 07/20/22 Left  08/15/2022 Left 09/26/2022 Left 11/16/2022 Left 12/28/2022 Left  03/13/2023 Left  05/10/23 Left 10/23/23  Shoulder flexion 3-/5 3/5 3+/5 4+/5 4+/5 4+/5 4+/5 4+/5 4+/5 4+/5 4+/5  Shoulder abduction 3-/5 3-/5 4-/5 4+/5 4+/5 4+5 4+/5 4+/5 4+/5 4+/5 4+/5  Shoulder adduction             Shoulder extension             Shoulder internal rotation             Shoulder external rotation             Elbow flexion 3/5 3+/5 4-/5 4+/5 5/5 5/5 5/5 5/5 5/5 5/5 5/5  Elbow extension 2-/5 2/5  4+/5 4+/5 4+/5  4/5 4/5 4/5 through available range 4+/5 through available range  Wrist flexion             Wrist extension 2/5 2+/5 3-/5  3-/5 3-/5 3-/5 3/5 3/5 3/5 3+/5  Wrist ulnar deviation             Wrist radial deviation             Wrist pronation             Wrist supination             (Blank rows = not tested)  Right eval Right 03/21/2022 Right  05/25/2022 R 07/20/22 Right 08/15/2022 Right 09/26/2022 Right 11/16/2022  Right 12/28/2022 Right 03/13/2023 Right 05/10/23 Right 10/23/23  Shoulder flexion 3-/5 scaption 3-/5 3-/5 3+/5 3+/5 3+/5 3/5 3/5 3/5 3+/5 4-/5  Shoulder abduction 3-/5 3-/5 3-/5 4/5 4/5 4/5 4/5 4/5 4/5 4/-5 4-/5  Shoulder adduction             Shoulder extension             Shoulder internal rotation             Shoulder external rotation             Elbow flexion 3/5 3/5 3+/5 4/5 4+/5 4+/5 4+/5 4+/5 4+/5 4+/5 4+/5  Elbow extension 2-/5 2/5 3-/5 4+/5 4+/5 4+/5 4+/5 4-/5 4-/5 4-/5 4-/5 through available range  Wrist flexion             Wrist extension 2-/5 2-/5 2/5 2/5 2/5 2/5 2/5 2/5 2/5 2/5 2/5  Wrist ulnar deviation  Wrist radial deviation             Wrist pronation             Wrist supination               HAND FUNCTION:  Grip strength: Right: 0 lbs; Left: 0 lbs and Lateral pinch: Right: 5 lbs, Left: 2 lbs  03/21/2022: Lateral pinch: Right: 3.5 lbs, Left: 2 lbs  07/20/22: Grip strength: Right: 3 lbs; Left: 4 lbs and Lateral pinch: Right: 5 lbs, Left: 3 lbs  08/15/2022:  Grip strength: Right: 0 lbs; Left: 5 lbs and Lateral pinch: Right: 2 lbs, Left: 4 lbs  09/26/2022  Grip strength: Right: 0 lbs; Left: 8 lbs and Lateral pinch: Right: 2 lbs, Left: 5 lbs  11/16/2022  Grip strength: Right: 0 lbs; Left: 8 lbs  9/25/20204  Grip strength: Right: 0 lbs; Left: 8 lbs  Grip strength: Right: 0 lbs; Left: 8 lbs   03/08/23:  Grip strength: Right: 0 lbs without PROM, 5 lbs after PROM, Left 8 lbs  05/10/23:  Grip strength: TBD  Bilateral digit PIP/DIP flexion contractures with MP hyperextension with attempts for AROM. Pt. is able to tolerate AROM to the bilateral digits at the initial evaluation however, has a history of pain in the digits.  COORDINATION: Eval: Pt. is unable to grasp 9-hole test pegs. Pt. is able to initiate grasping larger pegs, and is able to hold a pen in the left hand.  07/20/22: 2 min 36 seconds to remove 9 pegs from 9 hole peg test - cues to  locate pegs 2/2 low vision. Pt. is able to initiate grasping larger pegs on R hand and is able to hold a pen in the left hand.  08/15/2022:    Vision, and sensation limiting accuracy of 9 hole peg test results. Pt. Was able to grasp and remove vertical pegs intermittently with cues.    05/10/23: TBD  SENSATION: Light touch: Impaired   EDEMA:  N/A  MUSCLE TONE: BUE flexor Spasticity  COGNITION Overall cognitive status: Continue to assess in functional context  VISION:   Subjective report: Pt. was not wearing glasses at the time of the initial eval.  Baseline vision: Vision is very limited. Wears glasses all the time Visual history: History of impaired vision following CVA. Pt. Has received treatment through the [facility] low vision rehabilitation program.   VISION ASSESSMENT: Impaired To be further assessed in functional context  PERCEPTION: Impaired   PRAXIS: Impaired: motor planning  OBSERVATIONS:  Pt reports being on Tramadol   TODAY'S TREATMENT 10/24/23    Measurements were obtained, and goals were reviewed with the Pt.     PATIENT EDUCATION: Education details: PROM/AAROM , BUE functioning, goals, POC. Person educated: Patient and Parent Education method: Explanation Education comprehension: verbalized understanding  HOME EXERCISE PROGRAM:   Bilateral hand position in the [employer]   GOALS: Goals reviewed with patient? Yes  SHORT TERM GOALS: Target date: 12/05/2023   To assess splint fit, and make appropriate adjustments to promote good skin integrity through the palmar surface of the bilateral hands.  Baseline: 05/25/22: Goal currently met, however ongoing as needs to assess splint fit arise. 03/23/2022: Pt. is wearing splints a couple of hours at night bilateral resting hand splints. 03/21/2022: Pt. is wearing splints a couple of hours at night bilateral resting hand splints. Goal status: Deferred   LONG TERM GOALS: Target date: 01/15/2024   FOTO score will  Improve by 2 points for  Pt. perceived improvement with the assessment specific ADL/IADL tasks.  Baseline: 03/08/23: 51; 12/28/2022: FOTO score: 56; 11/16/2022: 52 09/27/2022: 51 07/20/2022: FOTO 55 05/25/2022: FOTO score: 50,  TR score: 45 Eval: FOTO score: 40, TR score: 45 Goal status: Achieved   2.   Pt. will independently perform oral care for 100% of the task after complete set-up. Baseline: 05/10/23: Pt. Performs 90% of the task. Pt.'s mother assists with 10% of the task for thorough cleaning the hard to reach places way in the back of the oral cavity.03/08/23: not as much assist required proximally, but to adjust positioning of toothbrush; 02/13/23: Pt. Continues to require assist proximally 01/09/2023: Continue 12/28/2022:  Pt. Continues to complete 90% of the task with complete set-up, with visual cues, and occassional assist of the right elbow. 11/16/2022: Pt. Completes 90% of the task with complete set-up. 09/26/2022: Completes 90% of the task, proximal assist at times required at the elbow.  08/15/2022: Completes 90% of the task, proximal assist at times required at the elbow. 07/20/22: completes 90% of task, limited by shoulder flexion. 05/25/2022:  Pt. Is able to initiate and perform oral care for approximately 90% of the task. Complete set-up required. Assi needed only for the very back teeth. 03/23/2022: Pt. Is able to initiate and perform oral care for approximately 75% of the task. Pt. Requires assist at proximally at the elbow for through oral care. 03/21/2022: Pt. Is able to initiate and perform oral care for approximately 75% of the task. Pt. Requires assist at proximally at the elbow for through oral care. Eval: Pt. is able to initiate using an electric toothbrush. Pt. requires assist for set-up, and assist for thoroughness, and as he Pt. fatigues. Goal status:  Partially met, Goal discontinued  3.  Pt. Will be modified independence with self-feeding for 100% using a swivel spoon, and standard fork  after complete set-up Baseline: 05/10/2023: Pt. feeds self 95-100% of the meal. with occasional assist to reposition the utensil. 03/08/23: indep with cup with a handle and lid, assist to reposition eating utensils in hand;  02/13/23:  Pt. Is now able to consistently, and efficiently use a swivel spoon for eating cereal with the left hand.01/09/2023: Continue 12/28/2022: Pt. Is now feeding himself 90% of the time using a swivel spoon with the left hand, and 95% of the time with the fork. Pt. Continues to have difficulty with cutting food. 11/16/2022: 100% with finger foods, Pt. Is initiating with a swivel spoon, and universal cuff for fork use, however requires assist for consistency, and accuracy.  09/26/2022: 90% using the spoon, assist at time with the forearm motion, and grip on the utensils. 100% for finger foods.  08/15/2022:  90% using the spoon, assit at time with the forearm motion, and grip on the utensils. 100% for finger foods-independent with set-up- unsupervised.  07/20/22: self-feeds cereal using spoon 90% of task. 05/25/2022: Pt. Is able to use a spoon to scoop cereal when feeding himself cereal 85% of the time. Pt. Is able to feed himself snack/finger foods 100% of the time. Pt. Continues to work on consistency of  stabilizing a cup/mug when drinking. Pt. Is able to grasp a water  bottle with assist initially, with assist tapering off as he drinks.03/23/2022: Pt. Is able to perform scooping cereal for 75% of the time. Pt. required assist, and support at the left elbow, and Pt. Presents with limited forearm supination when using the spoon, and bringing it towards his mouth. Pt. Is able to use a  fork to spear items, and perform the hand to mouth pattern.  03/21/2022: Pt. Is able to perform scooping cereal for 75% of the time. Pt. required assist, and support at the left elbow, and Pt. presents with limited forearm supination when using the spoon, and bringing it towards his mouth. Pt. Is able to use a fork to  spear items, and perform the hand to mouth pattern.  Eval: Pt. is able to hold standard standard utensils. Pt. Performs as much of the task as he, can and has assistance for the remainder. Goal status:  Improved, Achieved  4.  Pt. will improve grasp patterns and consistently grasp 1/4 objects for ADL, and IADL tasks.  Baseline: 10/23/2023: Pt. is able to initiate grasping small objects visual cues, and increased tim, however continues to work towards grasping 1/4 objects efficiently. 08/27/23: Pt. is able to grasp flat discs, and sustain a secure hold on them while moving the UE through various planes. 08/07/23: Continue 05/31/2023: Continue 05/10/23: Pt. is consistently able to grasp 1/2 objects with visual cues. Pt. Requires assist, and increased visual cues to grasp 1/4 objects on an elevated plane.  Pt. Is unable to grasp the flat objects at the tabletop surface. Pt. Is grasping grasping small puppy vitamins.03/08/23: 1/2 objects efficiently; 02/13/2023: 1/2 objects efficiently 01/09/2023: Continue 12/28/2022: Continues to grasp 1/4 pegs with min a + visual cues, consistently grasping 1/2 objects with visual cues 11/16/2022: Continues to grasp 1/4 pegs with min a + visual cues, consistently grasping 1/2 objects with visual cues 09/26/2022: Continues to grasp 1/4 pegs with min a + visual cues, consistently grasping 1/2 objects with visual cues. 08/15/2022: grasps 1/4 pegs with min a + visual cues, consistently grasping 1/2 objects with visual cues. 07/20/22: grasps 1/4 pegs with min a + visual cues, consistently grasping 1/2 objects with visual cues. 05/25/2022: Pt. Is working on improving consistency of grasping 1/2 objects with visual cues.  03/23/2022: Pt. Is able to grasp 1 objects consistently,and continues to work on the hand patterns needed to grasp 1/2 objects.03/21/2022: Pt. Is able to grasp 1 objects consistently,and continues to work on the hand patterns needed to grasp 1/2 objects. Eval:  Pt. is able to grasp 1 objects intermittently using a lateral grasp pattern. Goal status: Ongoing  5.  Pt. will independently write his name legibly with letter sizes under 1. Baseline: 05/10/23: Deferred 03/08/23: Not addressed this period; 02/13/2023: Continue 01/09/2023: Continue. 12/28/2022: Pt. is now using an IPad Pen. 11/16/2022: Pt. Continues to be able to write name with smaller letter size. Pt. Is signing name with a computer styus. Pt. Requires visual cues, and assist 09/26/2022: Pt. Continues to be able to write name with smaller letter size. Pt. Is signing name with a computer styus. Pt. Requires visual cues, and assist. 08/15/2022: Pt. Is able to write name with smaller letter size. Pt. Is signing name with a computer styus. Pt. Requires visual cues, and assist. 07/20/22: stabilizing assist to write name with 75% legibility with 2 letters. 05/25/2022: Pt. to continue to work towards formulating grasp patterns in preparation for grasping a large width pen. Pt. Requires visual cues. 03/23/2022: Pt. Is able to write his name with modA, however has difficulty with formulating letter sizes less than 2 in size with 50% legibility for the 3 letters of his name.03/21/2022: Pt. Is able to write his name with modA, however has difficulty with formulating letter sizes less than 2 in size with 50% legibility for the 3 letters of his  name. Eval: Pt is able to hold a thin marker with his left hand, and formulate a line, and initiate a circular pattern (Pt. without glasses today) Goal status: Deferred  6. Pt. Will reach up to comb/brush his hair with minA.  Baseline: 05/10/23: Pt. is able to use a pick independently with the left hand with occasional assist to the far side of the left his head. 03/08/23: Pt uses pick in L hand to reach 75% of his head; unable to reach R side of head; 02/13/2023:Pt. is able to use a hair pick with the left hand on the right side of his head, top, and back of the head. Pt. Continues  to have difficulty reaching further to the right side of his head with the left. 01/09/2023: Continue 12/28/2022: Pt. Is progressing, and is now able to use a hair pick with the left hand on the right side of his head, top, and back of the head. Pt. Continues to have difficulty reaching further to the right side of his head with the left. 11/16/2022: Pt. Is able to use a hair pick for the left side of his head using his left hand. Pt. Presents with difficulty reaching to the right side of his head.09/26/2022: Pt. Is able to use a hair pick for the left side of his head using his left hand. Pt. Presents with difficulty reaching to the right side of his head. 5/13/202: 75% using a hair pick, assist to the right side of the head. 07/20/22: reaches 75% of head, assist for far R side of head, fatigues quickly. 05/25/2022: Pt. Is able to reach up with the left hand to the left side, top, and back of his head. 03/23/2022: Pt. is now able to more consistently initiate reaching up to his head with his left hand in preparation for haircare12/18/2023: Pt. is now able to more consistently initiate reaching up to his head with his left hand in preparation for haircare. Eval: Pt. is able to initiate reaching up for hair care with a long handled brush, however is unable to sustain UE's in elevation to perform the task.     Goal status: Achieved  7. Pt. Will independently navigate the w/c through his environment with minA with visual scanning, and hand placement on the controls.  Baseline: 11/16/2022: Deferred 2/2 vision 09/26/2022: Pt. Is now navigating his w/c ouside the home, and around his block. 08/15/2022: Pt. Requires minA to setup hand on controls and MIN cues to navigate the w/c in wide spaces, requires MIN - MOD cues to navigate the w/c through more narrow doorways, and tighter turns - varies based on good vs bad vision days.07/20/22: Pt. Requires minA to setup hand on controls and MIN cues to navigate the w/c in wide spaces,  requires MIN - MOD cues to navigate the w/c through more narrow doorways, and tighter turns - varies based on good vs bad vision days. 05/25/2022: Pt. Requires minA  and  cues to navigate the w/c in wide spaces, and requires Mod cues to navigate the w/c through more narrow doorways, and tighter turns. Pt. Requires max cues for scanning through the environment, and moderate cues for hand placement on the controls.     Goal status: Deferred 2/2 vision    8. Pt. Will improve bilateral grip strength to be able to independently grasp, and pull up blankets, and linens.   Baseline: 05/10/23: Deferred 03/08/23: R grip 0-5 lbs, L grip 8 lbs; pt reports he can consistently pull  up blankets and linens independently.  02/13/2023: Continue. Pt. presents with digit PIP, and DIP flexor tightness. 01/09/2023: Continue 12/28/2023: R: 0#, L: 5# Pt. Is now able to more consistently grasp and pull blankets up over him. 11/16/2022: R: 0 L: 8# Pt. Is more consistently grasping his blankets and attempting to pull them up. 09/26/2022: R: 0  L: 8# Pt. is attempting to grasp, and pull blankets up more, using mostly the left hand. 08/15/2022: Pt. has difficulty securely holding, and pulling up blankets, and linens.    Goal status:Deferred  9. Pt. will consistently actively control the releasing of blankets, covers, and linens from his hands once they are in the desired position over him. Baseline:12/28/2022: Pt. Is consistently actively able to release linens once they are in the desired position over him. 11/16/2022: Pt. continues to improve with actively releasing blankets form his hands. 09/26/2022: Pt. is improving with actively releasing blankets form his hands. 08/15/2022: Pt. has difficulty with controlled releasing of blankets/linens from his grasp.     Goal status: Achieved    10. Pt. Will improve bilateral UE strength by 2 MM grades to assist with ADLs, and IADLs  Baseline: 10/23/2023: Right: shoulder flexion: 4-/5, abduction: 4-/5,  elbow flexion: 4+/5 , extension: 4-/5 wrist extension: 2/5; Left: shoulder flexion: 4+/5, abduction: 4+/5, elbow flexion: 5/5 , extension: 4+/5  wrist extension: 3+/5  08/27/23:  Right: shoulder flexion: 3+/5, abduction: 4-/5, elbow flexion: 4+/5 , extension: 4-/5 wrist extension: 2/5; Left: shoulder flexion: 4+/5, abduction: 4+/5, elbow flexion: 5/5 , extension: 4/5  wrist extension: 3/5  08/07/23: Continue 05/31/2023: Continue 05/10/23: Right: shoulder flexion: 3+/5, abduction: 4-/5, elbow flexion: 4+/5 , extension: 4-/5 wrist extension: 2/5; Left: shoulder flexion: 4+/5, abduction: 4+/5, elbow flexion: 5/5 , extension: 4/5  wrist extension: 3/5 03/13/2023: Right: shoulder flexion: 3/5, abduction: 4/5, elbow flexion: 4+/5 , extension: 4-/5 wrist extension: 2/5; Left: shoulder flexion: 4+/5, abduction: 4+/5, elbow flexion: 5/5 , extension: 4/5  wrist extension: 3/5 03/08/23: NT this date d/t as pt arrived late; 02/13/2023: Continue 01/09/2023: Continue 12/28/2022: Right: shoulder flexion: 3/5, abduction: 4/5, elbow flexion: 4+/5 , extension: 4-/5 wrist extension: 2/5; Left: shoulder flexion: 4+/5, abduction: 4+/5, elbow flexion: 5/5 , extension: 4/5  wrist extension: 3/5 Right: shoulder flexion: 3+/5, abduction: 4/5, elbow flexion: 4+/5 , extension: 4+/5 wrist extension: 2/5; Left: shoulder flexion: 4+/5, abduction: 4+/5, elbow flexion: 5/5 , extension: 4+/5 wrist extension: 3-/5 11/16/2022: Right: shoulder flexion: 3/5, abduction: 4/5, elbow flexion: 4+/5 , extension: 4+/5 wrist extension: 2/5; Left: shoulder flexion: 4+/5, abduction: 4+/5, elbow flexion: 5/5 , extension:  wrist extension: 3-/5 Right: shoulder flexion: 3+/5, abduction: 4/5, elbow flexion: 4+/5 , extension: 4+/5 wrist extension: 2/5; Left: shoulder flexion: 4+/5, abduction: 4+/5, elbow flexion: 5/5 , extension: 4+/5 wrist extension: 3-/5 Goal status: Ongoing  11. Pt. will improve right wrist extension by 10 degrees to initiate reaching for items in  preparation for ADLs. Baseline: 10/23/23: -30(-8) 08/23/2023: R Wrist Ext: -31(-8)  08/07/23: Continue 05/31/23: Continue 05/10/23: Right wrist extension: -32(22) 03/13/23: -22(35) 03/08/23: NT this date as pt arrived late; 02/13/2023: Continue 01/09/2023: Continue 12/28/2022: -22(34) 11/16/2022: -30(10)    Goal status: Ongoing      12. Pt. Will require donn/doff a Tank top T-shirt efficiently with supervision and full set-up.    Baseline:10/23/23: Independent doff the shirt/tank, MaxA  donning. 10/23/23: Pt. Is able to independently doff his shirt, requiring assist to lean forward and loosen the back. 08/07/23: Continue 05/31/2023: Pt. Requires maxA donning a pullover sweatshirt, Mod-maxA  doffing it. T-shirt tank TBD 05/10/23: Pt. Presents with difficulty completing, and requires increased time to complete    Goal status: Ongoing    13. Pt. Will independently perform bilateral/bimanual hand tasks needed to fold towels/linens/laundry Baseline: 10/23/23: Pt. Continues to have difficulty folding laundry, and linens. 08/23/23: Pt. Continues to have difficulty using bilateral/bimanual hand tasks needed to fold towels/linens/laundry. 08/07/23: Continue 05/31/23: Continue 05/10/23:  Pt. Has difficulty folding towels/lines/laundry using bilateral hands Goal status:Ongoing  14. Pt. Will independently, and efficiently reach his right arm over the side of the chair to pet his dog.    Baseline: 08/23/2023: Pt. Is now able to reach over the side of the armrest of his recliner chair to pet his dog.  08/07/23: Continue 05/31/2023: Continue 05/10/23: Pt. is unable to efficiently reach his right UE over the side of his recliner chair to pet his dog.    Goal status: Achieved    15. Pt. Will independently, and efficiently reach out in front of him to efficiently place items onto his table while sitting in his recliner.     Baseline: 10/23/23: Pt. Is improving with functional reaching with the left UE out in front of him to place items onto  the table. Pt. has difficulty reaching out with the right hand to  place items on the table. 08/23/2023: Pt. has difficulty consistently reaching out in front of him to place items on the on a tabletop surface with the right hand. 08/07/23: Continue 2/26/225: Continue 05/10/23: Pt. has difficulty reaching out in front of him far enough to efficiently place items on the table while sitting in a recliner chair.    Goal status: Ongoing     16. Pt. Will improve bilateral hand supination by 10 degrees to be able  actively turn items in his hand to look at them independently and efficiently during ADLs/IADLs,    Baseline: 10/23/23: Right: unable, L: supination 30(38)    Goal status: New      17. Pt. Will independently, and efficiently transition between picking up finger foods for dipping, and  picking up utensils , then placing them down on the tabletop surface through 75% of the meal with minimal cues.    Baseline: Pt.requires heavy cues, and assist to grasp finger foods, and utensils, and transition between the two.    Goal status: New   ASSESSMENT:  CLINICAL IMPRESSION:    Measurements were obtained, and goals were reviewed, and modified with the Pt. Pt. has made progress over this progress reporting period. Pt. is improving with UE strength, and is able to tolerate blue level resistive theraband. Pt. has improved with UE dressing, and is able to doff his shirt independently, and efficiently. Pt. continues to require extensive assist to doff his shirt. Pt. continues to present with limited bilateral supination, pronation which limit his ability to turn items in his hand to look at them efficiently. Pt. presents with limited right active wrist extension. Pt. could benefit from the [employer] hand system to help improve forearm supination, and wrist extension. Pt. is beginning to achieve intermittent active responses digit PIP, and DIP extension responses following Botox injections. Pt. has difficulty with  efficiently transitioning between grasping finger foods, dipping them, and picking up utensils to use, and placing them down. New goals were added to the POC for improving supination, and grasping finger foods. OT frequency of treatment remains appropriate. Pt. continues to be highly motivated. Pt.'s caregivers continue to be very supportive to continue working towards improving  BUE functioning, ROM, strength, motor control, [facility] skills, as well as visual compensatory strategies in order to maximize independence with daily ADLs, and IADL functioning.     PERFORMANCE DEFICITS in functional skills including ADLs, IADLs, coordination, dexterity, proprioception, ROM, strength, pain, FMC, GMC, decreased knowledge of use of DME, and UE functional use, cognitive skills including safety awareness, and psychosocial skills including coping strategies, environmental adaptation, habits, and routines and behaviors.   IMPAIRMENTS are limiting patient from ADLs, IADLs, leisure, and social participation.   COMORBIDITIES may have co-morbidities  that affects occupational performance. Patient will benefit from skilled OT to address above impairments and improve overall function.  MODIFICATION OR ASSISTANCE TO COMPLETE EVALUATION: Maximum or significant modification of tasks or assist is necessary to complete an evaluation.  OT OCCUPATIONAL PROFILE AND HISTORY: Comprehensive assessment: Review of records and extensive additional review of physical, cognitive, psychosocial history related to current functional performance.  CLINICAL DECISION MAKING: High - multiple treatment options, significant modification of task necessary  REHAB POTENTIAL: Fair    EVALUATION COMPLEXITY: High    PLAN: OT FREQUENCY: 2 x's a week  OT DURATION:12 weeks  PLANNED INTERVENTIONS: self care/ADL training, therapeutic exercise, therapeutic activity, neuromuscular re-education, manual therapy, passive range of motion, patient/family  education, and cognitive remediation/compensation RECOMMENDED OTHER SERVICES: PT  CONSULTED AND AGREED WITH PLAN OF CARE: Patient and family member/caregiver  PLAN FOR NEXT SESSION: Review HEP  Richardson Otter, MS, OTR/L  10/23/2023

## 2023-10-25 ENCOUNTER — Ambulatory Visit: Payer: Medicare Other

## 2023-10-25 ENCOUNTER — Ambulatory Visit: Payer: Medicare Other | Admitting: Occupational Therapy

## 2023-10-25 DIAGNOSIS — R269 Unspecified abnormalities of gait and mobility: Secondary | ICD-10-CM

## 2023-10-25 DIAGNOSIS — I6930 Unspecified sequelae of cerebral infarction: Secondary | ICD-10-CM

## 2023-10-25 DIAGNOSIS — R2689 Other abnormalities of gait and mobility: Secondary | ICD-10-CM | POA: Diagnosis not present

## 2023-10-25 DIAGNOSIS — R2681 Unsteadiness on feet: Secondary | ICD-10-CM | POA: Diagnosis not present

## 2023-10-25 DIAGNOSIS — R278 Other lack of coordination: Secondary | ICD-10-CM | POA: Diagnosis not present

## 2023-10-25 DIAGNOSIS — M6281 Muscle weakness (generalized): Secondary | ICD-10-CM

## 2023-10-25 DIAGNOSIS — R262 Difficulty in walking, not elsewhere classified: Secondary | ICD-10-CM

## 2023-10-25 NOTE — Therapy (Unsigned)
 . Occupational Therapy Treatment Note    Patient Name: Paul Hall MRN: [id number] DOB:07-10-1995, 28 y.o., male Today's Date: 10/26/2023  PCP: Dr. Bertell REFERRING PROVIDER: Dr. Bertell   .   OT End of Session - 10/25/23 1741     Visit Number [id number]    Number of Visits 180    Date for OT Re-Evaluation 01/16/24    OT Start Time 1536    OT Stop Time 1615    OT Time Calculation (min) 39 min    Activity Tolerance Patient tolerated treatment well    Behavior During Therapy WFL for tasks assessed/performed                Past Medical History:  Diagnosis Date   Diabetes mellitus (HCC)    Hypertension    Stroke [facility])    Past Surgical History:  Procedure Laterality Date   IVC FILTER PLACEMENT ([facility] HX)     LEG SURGERY     PEG TUBE PLACEMENT     TRACHEOSTOMY     Patient Active Problem List   Diagnosis Date Noted   Sepsis due to vancomycin  resistant Enterococcus species (HCC) 06/06/2019   SIRS (systemic inflammatory response syndrome) (HCC) 06/05/2019   Acute lower UTI 06/05/2019   VRE (vancomycin -resistant Enterococci) infection 06/05/2019   Anemia 06/05/2019   Skin ulcer of sacrum with necrosis of muscle (HCC)    Urinary retention    Type 2 diabetes mellitus without complication, with long-term current use of insulin  (HCC)    Tachycardia    Lower extremity edema    Acute metabolic encephalopathy    Obstructive sleep apnea    Morbid obesity with BMI of 60.0-69.9, adult (HCC)    Goals of care, counseling/discussion    Palliative care encounter    Sepsis (HCC) 04/27/2019   H/O insulin  dependent diabetes mellitus 04/27/2019   History of CVA with residual deficit 04/27/2019   Seizure disorder (HCC) 04/27/2019   Decubitus ulcer of sacral region, stage 4 (HCC) 04/27/2019   ONSET DATE: 01/2019  REFERRING DIAG: CVA/COVID-19  THERAPY DIAG:  Muscle weakness (generalized)  Other lack of coordination  Rationale for Evaluation and Treatment  Rehabilitation  SUBJECTIVE:  Pt.Caregiver reports that pt. Has been working on grasping standard utensils with L hand from tabletop surface during self-feeding tasks at home.   SUBJECTIVE STATEMENT:  Pt.  reports doing well today  Pt accompanied by: self and family member  PERTINENT HISTORY:  Pt. is a 28 y.o. male who was diagnosed with COVID-19, and CVA with resultant quadriplegia. Pt. Was then hospitalized with VRE UTI. PMHx includes: urinary retention, seizure disorder, obstructive sleep disorder, DM Type II, Morbid obesity.   PRECAUTIONS: None  WEIGHT BEARING RESTRICTIONS No  PAIN:  No reports of pain Are you having pain? no  FALLS: Has patient fallen in last 6 months? No  LIVING ENVIRONMENT: Lives with: lives with their family Lives in: House/apartment Stairs: No Level Entry Has following equipment at home: Wheelchair (power) and hoyer lift, sit to stand lift  PLOF: Independent  PATIENT GOALS: To be able to engage in more daily care tasks.  OBJECTIVE:   HAND DOMINANCE: Right  ADLs:  Transfers/ambulation related to ADLs: Eating: Pt. reports being able to hold standard utensils, and is starting to engage more in self-feeding tasks, hand to mouth patterns. Pt. Reports that he does as much as he can with the task, and family assists  with the remainder of the task. Grooming: Pt. is able to initiate  holding an electric toothbrush, and brush his teeth. Family assists LB Dressing: Total Assist UE dressing: Pt. is now able to reach up to actively assist with grasping , and pulling his gown down. Toileting:  Total Assist Bathing: MaxA UB, Total assist LB Tub Shower transfers: N/A Equipment: See above    IADLs: Shopping: Relies on family to assist Light housekeeping: Total Assist Meal Prep: Total Assist Community mobility:   Medication management:  Total Assist  Financial management: N/A Handwriting: Not legible: Pt. Is able to hold a pen with the left hand, and  initiate marking the page. Pt.'s eye glasses were not available  MOBILITY STATUS:  Power w/c  POSTURE COMMENTS:  Pt. Requires position changes in his power w/c  ACTIVITY TOLERANCE: Activity tolerance:  Fair  FUNCTIONAL OUTCOME MEASURES: FOTO: 40 TR score: 45  05/25/2022:   FOTO: 50 TR score: 45  07/20/22: FOTO 55  03/08/23: 51  UPPER EXTREMITY ROM     Active ROM Left eval Left  03/21/2022 Left 05/25/2022 L  07/20/22 Left 07/25/2022 Left  08/15/2022 Left  09/26/2022 Left  11/16/2022 Left 12/28/2022 Left 03/13/23 Left 05/10/23 Left 10/23/23  Shoulder flexion 119 123 125 130  136 138 130 142 144 144 148  Shoulder abduction 110 115 115 115  118 121 125 142 140 140 140  Shoulder adduction              Shoulder extension              Shoulder internal rotation              Shoulder external rotation              Elbow flexion 135(135) 145 145 145  145    145 145   Elbow extension -27(-18) -21(-20) -20(-16)  -25(-10) -23(-10) -21(-10) -20(-18) -20(-18) -20(-17) -17(-16) -12(-8)  Wrist flexion              Wrist extension 10(50) 19(50) 28(50)  30(50) 35(55) 36(55) 36(60) 40(60) 45(60) 45(60) 42(54)  Wrist ulnar deviation              Wrist radial deviation              Wrist pronation            60(74)  Wrist supination   Limited by flexor tone       10(15)  30(38)  (Blank rows = not tested)    Active ROM Right eval Right 03/21/2022 Right 05/25/2022 R  07/20/22 Right  07/25/22 Right 08/15/2022 Right 09/26/2022 Right 11/16/2022 Right 12/28/2022 Right 03/13/23 Right 05/10/23 Right 10/23/23  Shoulder flexion 106 scaption 105  Scaption 62 flexion 110 scaption 100  105 105 105 112 Scaption 123 Scaption 122  Scaption 142 Scaption  Shoulder abduction 114 90 97 100  105 117 120 124 124 122 144  Shoulder adduction              Shoulder extension              Shoulder internal rotation              Shoulder external rotation              Elbow flexion 120(130) 145 145 140  144  140 142 148 148 145   Elbow extension -45(-35) -22(-35) -28(-22)  -32(-21) -32(-28) -28(-26) -28(-28) -27(-26) -27(-40) -38(-35) -34(-26)  Wrist flexion  Wrist extension -30(10) -25(10) -10(30)  -10(20) -10(25) -20(40) -30(10) -22(34) -22(35) -32(22) -30(-8)  Wrist ulnar deviation              Wrist radial deviation              Wrist pronation              Wrist supination   Limited by flexor tone                12/28/2022: Thumb radial abduction: Right: 12(46), Left: 40(54)  Digits: Bilateral 2nd through 5th digit: MP Hyperextension with PIP, an DIP flexion    10/23/23:  Bilateral 2nd through 5th digit MP hyperextension with PIP, and DIP flexion: Pt. has been intermittently able to initate active diigt PIP, and DIP extension following Botox during this past recertification/progress reporting period.     UPPER EXTREMITY MMT:     Left eval Left 03/21/2022 Left  05/25/2022 L 07/20/22 Left  08/15/2022 Left 09/26/2022 Left 11/16/2022 Left 12/28/2022 Left  03/13/2023 Left  05/10/23 Left 10/23/23  Shoulder flexion 3-/5 3/5 3+/5 4+/5 4+/5 4+/5 4+/5 4+/5 4+/5 4+/5 4+/5  Shoulder abduction 3-/5 3-/5 4-/5 4+/5 4+/5 4+5 4+/5 4+/5 4+/5 4+/5 4+/5  Shoulder adduction             Shoulder extension             Shoulder internal rotation             Shoulder external rotation             Elbow flexion 3/5 3+/5 4-/5 4+/5 5/5 5/5 5/5 5/5 5/5 5/5 5/5  Elbow extension 2-/5 2/5  4+/5 4+/5 4+/5  4/5 4/5 4/5 through available range 4+/5 through available range  Wrist flexion             Wrist extension 2/5 2+/5 3-/5  3-/5 3-/5 3-/5 3/5 3/5 3/5 3+/5  Wrist ulnar deviation             Wrist radial deviation             Wrist pronation             Wrist supination             (Blank rows = not tested)  Right eval Right 03/21/2022 Right  05/25/2022 R 07/20/22 Right 08/15/2022 Right 09/26/2022 Right 11/16/2022 Right 12/28/2022 Right 03/13/2023 Right 05/10/23 Right 10/23/23  Shoulder flexion  3-/5 scaption 3-/5 3-/5 3+/5 3+/5 3+/5 3/5 3/5 3/5 3+/5 4-/5  Shoulder abduction 3-/5 3-/5 3-/5 4/5 4/5 4/5 4/5 4/5 4/5 4/-5 4-/5  Shoulder adduction             Shoulder extension             Shoulder internal rotation             Shoulder external rotation             Elbow flexion 3/5 3/5 3+/5 4/5 4+/5 4+/5 4+/5 4+/5 4+/5 4+/5 4+/5  Elbow extension 2-/5 2/5 3-/5 4+/5 4+/5 4+/5 4+/5 4-/5 4-/5 4-/5 4-/5 through available range  Wrist flexion             Wrist extension 2-/5 2-/5 2/5 2/5 2/5 2/5 2/5 2/5 2/5 2/5 2/5  Wrist ulnar deviation             Wrist radial deviation             Wrist pronation  Wrist supination               HAND FUNCTION:  Grip strength: Right: 0 lbs; Left: 0 lbs and Lateral pinch: Right: 5 lbs, Left: 2 lbs  03/21/2022: Lateral pinch: Right: 3.5 lbs, Left: 2 lbs  07/20/22: Grip strength: Right: 3 lbs; Left: 4 lbs and Lateral pinch: Right: 5 lbs, Left: 3 lbs  08/15/2022:  Grip strength: Right: 0 lbs; Left: 5 lbs and Lateral pinch: Right: 2 lbs, Left: 4 lbs  09/26/2022  Grip strength: Right: 0 lbs; Left: 8 lbs and Lateral pinch: Right: 2 lbs, Left: 5 lbs  11/16/2022  Grip strength: Right: 0 lbs; Left: 8 lbs  9/25/20204  Grip strength: Right: 0 lbs; Left: 8 lbs  Grip strength: Right: 0 lbs; Left: 8 lbs   03/08/23:  Grip strength: Right: 0 lbs without PROM, 5 lbs after PROM, Left 8 lbs  05/10/23:  Grip strength: TBD  Bilateral digit PIP/DIP flexion contractures with MP hyperextension with attempts for AROM. Pt. is able to tolerate AROM to the bilateral digits at the initial evaluation however, has a history of pain in the digits.  COORDINATION: Eval: Pt. is unable to grasp 9-hole test pegs. Pt. is able to initiate grasping larger pegs, and is able to hold a pen in the left hand.  07/20/22: 2 min 36 seconds to remove 9 pegs from 9 hole peg test - cues to locate pegs 2/2 low vision. Pt. is able to initiate grasping larger pegs on R hand  and is able to hold a pen in the left hand.  08/15/2022:    Vision, and sensation limiting accuracy of 9 hole peg test results. Pt. Was able to grasp and remove vertical pegs intermittently with cues.    05/10/23: TBD  SENSATION: Light touch: Impaired   EDEMA:  N/A  MUSCLE TONE: BUE flexor Spasticity  COGNITION Overall cognitive status: Continue to assess in functional context  VISION:   Subjective report: Pt. was not wearing glasses at the time of the initial eval.  Baseline vision: Vision is very limited. Wears glasses all the time Visual history: History of impaired vision following CVA. Pt. Has received treatment through the [facility] low vision rehabilitation program.   VISION ASSESSMENT: Impaired To be further assessed in functional context  PERCEPTION: Impaired   PRAXIS: Impaired: motor planning  OBSERVATIONS:  Pt reports being on Tramadol   TODAY'S TREATMENT 10/25/23   Therapeutic Ex.:  -BUE PROM was performed with slow prolonged gentle stretching of bilateral hands including wrist/digit flexion/extension including Mps, PIPs, DIPs of all digits, and wrist flexion/extension.   Self-Care:  -Pt. Simulated self feeding task, using LUE to alternate grasping onto standard/adaptive utensils to grasping onto various sized circular foam blocks from sectional plate.  -Pt. Grasped  various sized lightweight circular foam discs using the LUE, following with simulating dipping the block into another section of the sectional plate, in preparation for hand to mouth patterns.  -To further challenge sustained grasp, Pt. Sustained grasped onto various sized foam blocks in combination with reaching out to perform elbow extension, followed by releasing  to discard the circular foam blocks onto raised tabletop surface. -Pt. Attempted L AROM forearm supination during self-feeding task using the LUE.     PATIENT EDUCATION: Education details: Incorporating Active L hand supination during  self-feeding tasks, grasping and releasing utensils, grasping snack foods from plate. Person educated: Patient and Parent Education method: Explanation Education comprehension: verbalized understanding  HOME EXERCISE PROGRAM:  Incorporating L hand supination during feeding tasks, grasping/releasing utensils at home, using BUE during feeding tasks.   GOALS: Goals reviewed with patient? Yes  SHORT TERM GOALS: Target date: 12/05/2023   To assess splint fit, and make appropriate adjustments to promote good skin integrity through the palmar surface of the bilateral hands.  Baseline: 05/25/22: Goal currently met, however ongoing as needs to assess splint fit arise. 03/23/2022: Pt. is wearing splints a couple of hours at night bilateral resting hand splints. 03/21/2022: Pt. is wearing splints a couple of hours at night bilateral resting hand splints. Goal status: Deferred   LONG TERM GOALS: Target date: 01/15/2024   FOTO score will Improve by 2 points for Pt. perceived improvement with the assessment specific ADL/IADL tasks.  Baseline: 03/08/23: 51; 12/28/2022: FOTO score: 56; 11/16/2022: 52 09/27/2022: 51 07/20/2022: FOTO 55 05/25/2022: FOTO score: 50,  TR score: 45 Eval: FOTO score: 40, TR score: 45 Goal status: Achieved   2.   Pt. will independently perform oral care for 100% of the task after complete set-up. Baseline: 05/10/23: Pt. Performs 90% of the task. Pt.'s mother assists with 10% of the task for thorough cleaning the hard to reach places way in the back of the oral cavity.03/08/23: not as much assist required proximally, but to adjust positioning of toothbrush; 02/13/23: Pt. Continues to require assist proximally 01/09/2023: Continue 12/28/2022:  Pt. Continues to complete 90% of the task with complete set-up, with visual cues, and occassional assist of the right elbow. 11/16/2022: Pt. Completes 90% of the task with complete set-up. 09/26/2022: Completes 90% of the task, proximal assist at times  required at the elbow.  08/15/2022: Completes 90% of the task, proximal assist at times required at the elbow. 07/20/22: completes 90% of task, limited by shoulder flexion. 05/25/2022:  Pt. Is able to initiate and perform oral care for approximately 90% of the task. Complete set-up required. Assi needed only for the very back teeth. 03/23/2022: Pt. Is able to initiate and perform oral care for approximately 75% of the task. Pt. Requires assist at proximally at the elbow for through oral care. 03/21/2022: Pt. Is able to initiate and perform oral care for approximately 75% of the task. Pt. Requires assist at proximally at the elbow for through oral care. Eval: Pt. is able to initiate using an electric toothbrush. Pt. requires assist for set-up, and assist for thoroughness, and as he Pt. fatigues. Goal status:  Partially met, Goal discontinued  3.  Pt. Will be modified independence with self-feeding for 100% using a swivel spoon, and standard fork after complete set-up Baseline: 05/10/2023: Pt. feeds self 95-100% of the meal. with occasional assist to reposition the utensil. 03/08/23: indep with cup with a handle and lid, assist to reposition eating utensils in hand;  02/13/23:  Pt. Is now able to consistently, and efficiently use a swivel spoon for eating cereal with the left hand.01/09/2023: Continue 12/28/2022: Pt. Is now feeding himself 90% of the time using a swivel spoon with the left hand, and 95% of the time with the fork. Pt. Continues to have difficulty with cutting food. 11/16/2022: 100% with finger foods, Pt. Is initiating with a swivel spoon, and universal cuff for fork use, however requires assist for consistency, and accuracy.  09/26/2022: 90% using the spoon, assist at time with the forearm motion, and grip on the utensils. 100% for finger foods.  08/15/2022:  90% using the spoon, assit at time with the forearm motion, and grip on the utensils. 100% for  finger foods-independent with set-up- unsupervised.   07/20/22: self-feeds cereal using spoon 90% of task. 05/25/2022: Pt. Is able to use a spoon to scoop cereal when feeding himself cereal 85% of the time. Pt. Is able to feed himself snack/finger foods 100% of the time. Pt. Continues to work on consistency of  stabilizing a cup/mug when drinking. Pt. Is able to grasp a water  bottle with assist initially, with assist tapering off as he drinks.03/23/2022: Pt. Is able to perform scooping cereal for 75% of the time. Pt. required assist, and support at the left elbow, and Pt. Presents with limited forearm supination when using the spoon, and bringing it towards his mouth. Pt. Is able to use a fork to spear items, and perform the hand to mouth pattern.  03/21/2022: Pt. Is able to perform scooping cereal for 75% of the time. Pt. required assist, and support at the left elbow, and Pt. presents with limited forearm supination when using the spoon, and bringing it towards his mouth. Pt. Is able to use a fork to spear items, and perform the hand to mouth pattern.  Eval: Pt. is able to hold standard standard utensils. Pt. Performs as much of the task as he, can and has assistance for the remainder. Goal status:  Improved, Achieved  4.  Pt. will improve grasp patterns and consistently grasp 1/4 objects for ADL, and IADL tasks.  Baseline: 10/23/2023: Pt. is able to initiate grasping small objects visual cues, and increased tim, however continues to work towards grasping 1/4 objects efficiently. 08/27/23: Pt. is able to grasp flat discs, and sustain a secure hold on them while moving the UE through various planes. 08/07/23: Continue 05/31/2023: Continue 05/10/23: Pt. is consistently able to grasp 1/2 objects with visual cues. Pt. Requires assist, and increased visual cues to grasp 1/4 objects on an elevated plane.  Pt. Is unable to grasp the flat objects at the tabletop surface. Pt. Is grasping grasping small puppy vitamins.03/08/23: 1/2 objects efficiently; 02/13/2023: 1/2  objects efficiently 01/09/2023: Continue 12/28/2022: Continues to grasp 1/4 pegs with min a + visual cues, consistently grasping 1/2 objects with visual cues 11/16/2022: Continues to grasp 1/4 pegs with min a + visual cues, consistently grasping 1/2 objects with visual cues 09/26/2022: Continues to grasp 1/4 pegs with min a + visual cues, consistently grasping 1/2 objects with visual cues. 08/15/2022: grasps 1/4 pegs with min a + visual cues, consistently grasping 1/2 objects with visual cues. 07/20/22: grasps 1/4 pegs with min a + visual cues, consistently grasping 1/2 objects with visual cues. 05/25/2022: Pt. Is working on improving consistency of grasping 1/2 objects with visual cues.  03/23/2022: Pt. Is able to grasp 1 objects consistently,and continues to work on the hand patterns needed to grasp 1/2 objects.03/21/2022: Pt. Is able to grasp 1 objects consistently,and continues to work on the hand patterns needed to grasp 1/2 objects. Eval: Pt. is able to grasp 1 objects intermittently using a lateral grasp pattern. Goal status: Ongoing  5.  Pt. will independently write his name legibly with letter sizes under 1. Baseline: 05/10/23: Deferred 03/08/23: Not addressed this period; 02/13/2023: Continue 01/09/2023: Continue. 12/28/2022: Pt. is now using an IPad Pen. 11/16/2022: Pt. Continues to be able to write name with smaller letter size. Pt. Is signing name with a computer styus. Pt. Requires visual cues, and assist 09/26/2022: Pt. Continues to be able to write name with smaller letter size. Pt. Is signing name with a computer styus. Pt. Requires visual cues, and assist. 08/15/2022:  Pt. Is able to write name with smaller letter size. Pt. Is signing name with a computer styus. Pt. Requires visual cues, and assist. 07/20/22: stabilizing assist to write name with 75% legibility with 2 letters. 05/25/2022: Pt. to continue to work towards formulating grasp patterns in preparation for grasping a large width  pen. Pt. Requires visual cues. 03/23/2022: Pt. Is able to write his name with modA, however has difficulty with formulating letter sizes less than 2 in size with 50% legibility for the 3 letters of his name.03/21/2022: Pt. Is able to write his name with modA, however has difficulty with formulating letter sizes less than 2 in size with 50% legibility for the 3 letters of his name. Eval: Pt is able to hold a thin marker with his left hand, and formulate a line, and initiate a circular pattern (Pt. without glasses today) Goal status: Deferred  6. Pt. Will reach up to comb/brush his hair with minA.  Baseline: 05/10/23: Pt. is able to use a pick independently with the left hand with occasional assist to the far side of the left his head. 03/08/23: Pt uses pick in L hand to reach 75% of his head; unable to reach R side of head; 02/13/2023:Pt. is able to use a hair pick with the left hand on the right side of his head, top, and back of the head. Pt. Continues to have difficulty reaching further to the right side of his head with the left. 01/09/2023: Continue 12/28/2022: Pt. Is progressing, and is now able to use a hair pick with the left hand on the right side of his head, top, and back of the head. Pt. Continues to have difficulty reaching further to the right side of his head with the left. 11/16/2022: Pt. Is able to use a hair pick for the left side of his head using his left hand. Pt. Presents with difficulty reaching to the right side of his head.09/26/2022: Pt. Is able to use a hair pick for the left side of his head using his left hand. Pt. Presents with difficulty reaching to the right side of his head. 5/13/202: 75% using a hair pick, assist to the right side of the head. 07/20/22: reaches 75% of head, assist for far R side of head, fatigues quickly. 05/25/2022: Pt. Is able to reach up with the left hand to the left side, top, and back of his head. 03/23/2022: Pt. is now able to more consistently initiate  reaching up to his head with his left hand in preparation for haircare12/18/2023: Pt. is now able to more consistently initiate reaching up to his head with his left hand in preparation for haircare. Eval: Pt. is able to initiate reaching up for hair care with a long handled brush, however is unable to sustain UE's in elevation to perform the task.     Goal status: Achieved  7. Pt. Will independently navigate the w/c through his environment with minA with visual scanning, and hand placement on the controls.  Baseline: 11/16/2022: Deferred 2/2 vision 09/26/2022: Pt. Is now navigating his w/c ouside the home, and around his block. 08/15/2022: Pt. Requires minA to setup hand on controls and MIN cues to navigate the w/c in wide spaces, requires MIN - MOD cues to navigate the w/c through more narrow doorways, and tighter turns - varies based on good vs bad vision days.07/20/22: Pt. Requires minA to setup hand on controls and MIN cues to navigate the w/c in wide spaces, requires MIN - MOD  cues to navigate the w/c through more narrow doorways, and tighter turns - varies based on good vs bad vision days. 05/25/2022: Pt. Requires minA  and  cues to navigate the w/c in wide spaces, and requires Mod cues to navigate the w/c through more narrow doorways, and tighter turns. Pt. Requires max cues for scanning through the environment, and moderate cues for hand placement on the controls.     Goal status: Deferred 2/2 vision    8. Pt. Will improve bilateral grip strength to be able to independently grasp, and pull up blankets, and linens.   Baseline: 05/10/23: Deferred 03/08/23: R grip 0-5 lbs, L grip 8 lbs; pt reports he can consistently pull up blankets and linens independently.  02/13/2023: Continue. Pt. presents with digit PIP, and DIP flexor tightness. 01/09/2023: Continue 12/28/2023: R: 0#, L: 5# Pt. Is now able to more consistently grasp and pull blankets up over him. 11/16/2022: R: 0 L: 8# Pt. Is more consistently grasping  his blankets and attempting to pull them up. 09/26/2022: R: 0  L: 8# Pt. is attempting to grasp, and pull blankets up more, using mostly the left hand. 08/15/2022: Pt. has difficulty securely holding, and pulling up blankets, and linens.    Goal status:Deferred  9. Pt. will consistently actively control the releasing of blankets, covers, and linens from his hands once they are in the desired position over him. Baseline:12/28/2022: Pt. Is consistently actively able to release linens once they are in the desired position over him. 11/16/2022: Pt. continues to improve with actively releasing blankets form his hands. 09/26/2022: Pt. is improving with actively releasing blankets form his hands. 08/15/2022: Pt. has difficulty with controlled releasing of blankets/linens from his grasp.     Goal status: Achieved    10. Pt. Will improve bilateral UE strength by 2 MM grades to assist with ADLs, and IADLs  Baseline: 10/23/2023: Right: shoulder flexion: 4-/5, abduction: 4-/5, elbow flexion: 4+/5 , extension: 4-/5 wrist extension: 2/5; Left: shoulder flexion: 4+/5, abduction: 4+/5, elbow flexion: 5/5 , extension: 4+/5  wrist extension: 3+/5  08/27/23:  Right: shoulder flexion: 3+/5, abduction: 4-/5, elbow flexion: 4+/5 , extension: 4-/5 wrist extension: 2/5; Left: shoulder flexion: 4+/5, abduction: 4+/5, elbow flexion: 5/5 , extension: 4/5  wrist extension: 3/5  08/07/23: Continue 05/31/2023: Continue 05/10/23: Right: shoulder flexion: 3+/5, abduction: 4-/5, elbow flexion: 4+/5 , extension: 4-/5 wrist extension: 2/5; Left: shoulder flexion: 4+/5, abduction: 4+/5, elbow flexion: 5/5 , extension: 4/5  wrist extension: 3/5 03/13/2023: Right: shoulder flexion: 3/5, abduction: 4/5, elbow flexion: 4+/5 , extension: 4-/5 wrist extension: 2/5; Left: shoulder flexion: 4+/5, abduction: 4+/5, elbow flexion: 5/5 , extension: 4/5  wrist extension: 3/5 03/08/23: NT this date d/t as pt arrived late; 02/13/2023: Continue 01/09/2023: Continue  12/28/2022: Right: shoulder flexion: 3/5, abduction: 4/5, elbow flexion: 4+/5 , extension: 4-/5 wrist extension: 2/5; Left: shoulder flexion: 4+/5, abduction: 4+/5, elbow flexion: 5/5 , extension: 4/5  wrist extension: 3/5 Right: shoulder flexion: 3+/5, abduction: 4/5, elbow flexion: 4+/5 , extension: 4+/5 wrist extension: 2/5; Left: shoulder flexion: 4+/5, abduction: 4+/5, elbow flexion: 5/5 , extension: 4+/5 wrist extension: 3-/5 11/16/2022: Right: shoulder flexion: 3/5, abduction: 4/5, elbow flexion: 4+/5 , extension: 4+/5 wrist extension: 2/5; Left: shoulder flexion: 4+/5, abduction: 4+/5, elbow flexion: 5/5 , extension:  wrist extension: 3-/5 Right: shoulder flexion: 3+/5, abduction: 4/5, elbow flexion: 4+/5 , extension: 4+/5 wrist extension: 2/5; Left: shoulder flexion: 4+/5, abduction: 4+/5, elbow flexion: 5/5 , extension: 4+/5 wrist extension: 3-/5 Goal status: Ongoing  11.  Pt. will improve right wrist extension by 10 degrees to initiate reaching for items in preparation for ADLs. Baseline: 10/23/23: -30(-8) 08/23/2023: R Wrist Ext: -31(-8)  08/07/23: Continue 05/31/23: Continue 05/10/23: Right wrist extension: -32(22) 03/13/23: -22(35) 03/08/23: NT this date as pt arrived late; 02/13/2023: Continue 01/09/2023: Continue 12/28/2022: -22(34) 11/16/2022: -30(10)    Goal status: Ongoing      12. Pt. Will require donn/doff a Tank top T-shirt efficiently with supervision and full set-up.    Baseline:10/23/23: Independent doff the shirt/tank, MaxA  donning. 10/23/23: Pt. Is able to independently doff his shirt, requiring assist to lean forward and loosen the back. 08/07/23: Continue 05/31/2023: Pt. Requires maxA donning a pullover sweatshirt, Mod-maxA doffing it. T-shirt tank TBD 05/10/23: Pt. Presents with difficulty completing, and requires increased time to complete    Goal status: Ongoing    13. Pt. Will independently perform bilateral/bimanual hand tasks needed to fold towels/linens/laundry Baseline: 10/23/23:  Pt. Continues to have difficulty folding laundry, and linens. 08/23/23: Pt. Continues to have difficulty using bilateral/bimanual hand tasks needed to fold towels/linens/laundry. 08/07/23: Continue 05/31/23: Continue 05/10/23:  Pt. Has difficulty folding towels/lines/laundry using bilateral hands Goal status:Ongoing  14. Pt. Will independently, and efficiently reach his right arm over the side of the chair to pet his dog.    Baseline: 08/23/2023: Pt. Is now able to reach over the side of the armrest of his recliner chair to pet his dog.  08/07/23: Continue 05/31/2023: Continue 05/10/23: Pt. is unable to efficiently reach his right UE over the side of his recliner chair to pet his dog.    Goal status: Achieved    15. Pt. Will independently, and efficiently reach out in front of him to efficiently place items onto his table while sitting in his recliner.     Baseline: 10/23/23: Pt. Is improving with functional reaching with the left UE out in front of him to place items onto the table. Pt. has difficulty reaching out with the right hand to  place items on the table. 08/23/2023: Pt. has difficulty consistently reaching out in front of him to place items on the on a tabletop surface with the right hand. 08/07/23: Continue 2/26/225: Continue 05/10/23: Pt. has difficulty reaching out in front of him far enough to efficiently place items on the table while sitting in a recliner chair.    Goal status: Ongoing     16. Pt. Will improve bilateral hand supination by 10 degrees to be able  actively turn items in his hand to look at them independently and efficiently during ADLs/IADLs,    Baseline: 10/23/23: Right: unable, L: supination 30(38)    Goal status: New      17. Pt. Will independently, and efficiently transition between picking up finger foods for dipping, and  picking up utensils , then placing them down on the tabletop surface through 75% of the meal with minimal cues.    Baseline: Pt.requires heavy cues, and  assist to grasp finger foods, and utensils, and transition between the two.    Goal status: New   ASSESSMENT:  CLINICAL IMPRESSION:  Pt. is engaging his bilateral UEs, and  hands during more tasks at home. Pt. presents with difficulty grasping various sized foam blocks from sectional plate, and grasping standard utensils from a raised tabletop surface, during self-feeding tasks. Pt. Required Mod A, with support proximally at the L wrist and elbow while performing elbow extension and hand to mouth patterns. Pt. Has difficulty stabilizing utensil with L hand, and  attempting to adjust the utensil using the R hand for proper placement in R hand in preparation for scooping into plate. Pt. has difficulty with efficiently transitioning between grasping finger foods, dipping them, and picking up utensils to use, and placing them down. Pt. Attempted L AROM forearm supination during self-feeding tasks using the LUE, being able to complete 50% of the ROM. Pt.'s caregivers continue to be very supportive to continue working towards improving BUE functioning, ROM, strength, motor control, [facility] skills, as well as visual compensatory strategies in order to maximize independence with daily ADLs, and IADL functioning.     PERFORMANCE DEFICITS in functional skills including ADLs, IADLs, coordination, dexterity, proprioception, ROM, strength, pain, FMC, GMC, decreased knowledge of use of DME, and UE functional use, cognitive skills including safety awareness, and psychosocial skills including coping strategies, environmental adaptation, habits, and routines and behaviors.   IMPAIRMENTS are limiting patient from ADLs, IADLs, leisure, and social participation.   COMORBIDITIES may have co-morbidities  that affects occupational performance. Patient will benefit from skilled OT to address above impairments and improve overall function.  MODIFICATION OR ASSISTANCE TO COMPLETE EVALUATION: Maximum or significant modification of  tasks or assist is necessary to complete an evaluation.  OT OCCUPATIONAL PROFILE AND HISTORY: Comprehensive assessment: Review of records and extensive additional review of physical, cognitive, psychosocial history related to current functional performance.  CLINICAL DECISION MAKING: High - multiple treatment options, significant modification of task necessary  REHAB POTENTIAL: Fair    EVALUATION COMPLEXITY: High    PLAN: OT FREQUENCY: 2 x's a week  OT DURATION:12 weeks  PLANNED INTERVENTIONS: self care/ADL training, therapeutic exercise, therapeutic activity, neuromuscular re-education, manual therapy, passive range of motion, patient/family education, and cognitive remediation/compensation RECOMMENDED OTHER SERVICES: PT  CONSULTED AND AGREED WITH PLAN OF CARE: Patient and family member/caregiver  PLAN FOR NEXT SESSION: Review HEP   Richardson Otter, MS, OTR/L   This entire session was performed under direct supervision and direction of a licensed Estate agent. I have personally read, edited and approve of the note as written.  Damien Nap, OTS  10/18/2023

## 2023-10-26 NOTE — Therapy (Signed)
 OUTPATIENT PHYSICAL THERAPY TREATMENT    Patient Name: Paul Hall MRN: [id number] DOB:04-Dec-1995, 28 y.o., male Today's Date: 10/26/2023  PCP:  [facility] PROVIDER:  Morene Sous, PA-C   PT End of Session - 10/25/23 2212     Visit Number 169    Number of Visits 184   date corrected from most recent recert   Date for PT Re-Evaluation 11/27/23    Authorization Type [employer] [place] Medicaid    Authorization Time Period 01/04/21-03/29/21; Recert 03/24/2021-06/16/2021; Recert 09/15/2021- 12/08/2021; Recert 12/13/2021-03/07/2022    Progress Note Due on Visit 170    PT Start Time 1616    PT Stop Time 1700    PT Time Calculation (min) 44 min    Equipment Utilized During Treatment Gait belt    Activity Tolerance Patient tolerated treatment well    Behavior During Therapy WFL for tasks assessed/performed                             Past Medical History:  Diagnosis Date   Diabetes mellitus (HCC)    Hypertension    Stroke (HCC)    Past Surgical History:  Procedure Laterality Date   IVC FILTER PLACEMENT ([facility] HX)     LEG SURGERY     PEG TUBE PLACEMENT     TRACHEOSTOMY     Patient Active Problem List   Diagnosis Date Noted   Sepsis due to vancomycin  resistant Enterococcus species (HCC) 06/06/2019   SIRS (systemic inflammatory response syndrome) (HCC) 06/05/2019   Acute lower UTI 06/05/2019   VRE (vancomycin -resistant Enterococci) infection 06/05/2019   Anemia 06/05/2019   Skin ulcer of sacrum with necrosis of muscle (HCC)    Urinary retention    Type 2 diabetes mellitus without complication, with long-term current use of insulin  (HCC)    Tachycardia    Lower extremity edema    Acute metabolic encephalopathy    Obstructive sleep apnea    Morbid obesity with BMI of 60.0-69.9, adult (HCC)    Goals of care, counseling/discussion    Palliative care encounter    Sepsis (HCC) 04/27/2019   H/O insulin  dependent diabetes mellitus 04/27/2019   History  of CVA with residual deficit 04/27/2019   Seizure disorder (HCC) 04/27/2019   Decubitus ulcer of sacral region, stage 4 (HCC) 04/27/2019    REFERRING DIAG: Cerebral infarction, unspecified   THERAPY DIAG:  Muscle weakness (generalized)  Other lack of coordination  Difficulty in walking, not elsewhere classified  Unsteadiness on feet  History of CVA with residual deficit  Abnormality of gait and mobility  Rationale for Evaluation and Treatment Rehabilitation  PERTINENT HISTORY: Paul Hall is a 26yoM who presents with severe weakness, quadriparesis, altered sensorium, and visual impairment s/p critical illness and prolonged hospitalization. Pt hospitalized in October 2020 with ARDS 2/2 COVID19 infection. Pt sustained a complex and lengthy hospitalization which included tracheostomy, prolonged sedation, ECMO. In this period pt sustained CVA and SDH. Pt has now been liberated from tracheostomy and G-tube. Pt has since been hospitalized for wound infection and UTI. Pt lives with parents at home, has hospital bed and left chair, hoyer lift transfers, and power WC for mobility needs. Pt needs heavy physical assistance with ADL 2/2 BUE contractures and motor dysfunction   PRECAUTIONS: Fall  SUBJECTIVE:  Patient reports feeling better. States his legs were sore after last visit.   PAIN:  Are you having pain? No. But feeling tight.    TODAY'S  TREATMENT:  - 10/25/2023     TA:  -Patient was dependently transferred from power w/c to Nustep today with 2 person assist + extra PT to ensure safely into seated position on chair of equipment. Patient did fit into seat- fastened seat belt for extra security and added a 5  block step for feet to touch  Static sittting on nustep- just trying to focus on seated balance and acclimate to new position.   Nustep BUE/LE (some assist to maintain grip on RUE) x 5 min with intermittent rest breaks- approx 20  min spent on nustep today. Able to propel  equipment with each LE and LUE - requiring assist to flex/ext right UE. Patient and mother were very pleased to see his progression today.     TE: From seat of Nustep Hip Add seated x 15 reps each LE Hip abd seated x 15 reps each LE Seated hip march (AAROM) 2 x 10 resp -LAQ 2 x 15 with 2 sec hold and slow eccentric lowering (2# on RLE/2.5 on RLE)    PATIENT EDUCATION: Education details: Exercise technique with VC   HOME EXERCISE PROGRAM:  Access Code: [email or web address] URL: [email or web address] Date: 03/23/2022 Prepared by: Reyes London  Exercises - Supine Bridge  - 3 x weekly - 3 sets - 10 reps - 2 hold - Supine Gluteal Sets  - 3 x weekly - 3 sets - 10 reps - 5 sec hold - Supine Quad Set  - 1 x daily - 3 x weekly - 3 sets - 10 reps - 5 hold - Seated Long Arc Quad  - 1 x daily - 7 x weekly - 3 sets - 10 reps - Seated Hip Adduction Squeeze with Ball  - 1 x daily - 3 x weekly - 3 sets - 10 reps - 5 hold - Seated Hip Abduction  - 1 x daily - 3 x weekly - 3 sets - 10 reps - 2 hold    PT Short Term Goals -       PT SHORT TERM GOAL #1   Title Pt will be independent with HEP in order to improve strength and balance in order to decrease fall risk and improve function at home and work.    Baseline 01/04/2021= No formal HEP in place; 12/12 no HEP in place; 05/10/2021-Patient and his father were able to report compliance with curent HEP consisting of mostly seated/reclined LE strengthening. Both verbalize no questions at this time.    Time 6    Period Weeks    Status Achieved    Target Date 02/15/21            PT LONG TERM GOAL #1     Title Patient will increase BLE gross strength by 1/2 muscle grade to improve functional strength for improved independence with potential gait, increased standing tolerance and increased ADL ability.     Baseline 01/04/2021- Patient presents with 1/5 to 3-/5 B LE strength with MMT; 12/12: goal partially met for Left knee/hip; 05/10/2021= 2-/5  bilateal Hip flex; 3+/5 bilateral Knee ext; 06/21/2021= Patient presents with 2-/5 bilateral Hip flex; 3+/5 bilateral knee ext/flex; 2-/5 left ankle DF; 0/5 right ankle- and able to increase reps and resistance with LE's. 09/15/2021- Patient technically presents with 2-/5 B hip flex/abd/add - but he is able to raise his hip up to approx 100 deg which has improved. 3+/5 Bilateral knee ext, 2-/5 left ankle and 0/5 right ankle.  12/08/2021= Patient able to lift left knee at  110 deg of hip flex; presents with 3+/5 knee ext, 2-/5 left ankle DF and 0/5 right ankle DF, 2-/5 bilateral Hip abd in seated position.    12/6: R: knee 3+/5 ext, 2/5 flexion, left knee 3+/5 extension, 3+/5 flexion, R hip: 2+/5 hip add, 2+/5 hip ABD L hip: 4-/5 hip ABD, 3+/5 hip ADD, 3+/5 hip flexion; 06/06/2022= Patient now presents with 2-/5 right ankle DF/PF;     Time 12     Period Weeks     Status MET    Target Date 03/07/2022         PT LONG TERM GOAL #2    Title Patient will tolerate sitting unsupported demonstrating erect sitting posture for 15 minutes with CGA to demonstrate improved back extensor strength and improved sitting tolerance.     Baseline 01/04/2021- Patient confied to sitting in lift chair or electric power chair with back support and unable to sit upright without physical assistance; 12/12: tolerates <1 minutes upright unsupported sitting. 05/10/2021=static sit with forward trunk lean  in his power wheelchair without back support x approx 3 min. 06/21/2021=Unable to assess today due to patient with acute back pain but on previous visit able to sit x 8 min without back support. 09/15/2021- on last visit- 09/13/2021- patient was able to sit unsupported x 8 min at edge of mat. 10/13/2023 - Patient was able to sit at edge of mat with varying level of assist today from SBA to min A for a total of 20 min. 12/13/2021= Patient demonstrated unsupported sitting at edge of mat for approx 20 min    Time 12     Period Weeks     Status GOAL  MET    Target Date 12/08/21          PT LONG TERM GOAL #3    Title Patient will demonstrate ability to perform static standing in // bars > 2 min with Max Assist  without loss of balance and fair posture for improved overall strength for pre-gait and transfer activities.     Baseline 01/04/2021= Patient current uanble to stand- Dependent on hoyer or sit to stand lift for transfers. 05/10/2021=Not appropriate yet- Currently still dependent with all transfers using hoyer. 06/21/2021= Patient continuing now to focus on LE strengthening to prepare for standing-unable to try today due to acute low back pain-  planning on attempting in new cert period. 09/15/2021- Patient has attempted standing 2x in past two week- max Assist of 2 people - only once was he successful to clearing his bottom from chair - Will continue to be a focus during the new certification. 12/13/2021= Patient has been limited secondary to increased overall low back pain during this certification and will require more time to focus on this goal.  12/6: not assessed this date, will assess at date when 2-3 PTs are present for assistance     Time 12     Period Weeks     Status GOAL not appropriate at this time - may attempt in future once Patient presents with improved overall LE strength.                 PT LONG TERM GOAL #4    Title Pt will improve FOTO score by 10 points or more demonstrating improved perceived functional ability     Baseline FOTO 7 on 10/17; 03/15/21: FOTO 12; 05/10/2021 06/21/2021= 1; 09/15/2021= 9; 12/13/2021= Will issue next visit 12/6: 4; 06/06/2022= Will assess next visit; Goal not appropriate as patient  is not ambulatory.     Time 12     Period Weeks     Status WITHDRAWN    Target date 11/30/2022         PT LONG TERM GOAL #5    Title Patient will perform sit to stand transfer with appropriate AD and max assist of 2 people with 75% consistency to prepare for pregait activities.     Baseline 09/15/2021= Patient unable to  stand well- unable to clear his bottom off chair with Max assist of 2 persons. 12/13/2021- Goal not appropriate to try yet but will keep and roll over to next cert as shift continues to focus on transfers/standing; 4/24= Patient able to perform active ankle DF/PF with right LE and able to raise his knee into seated march and clear floor without physical assist today - as previously unable as well as lift right knee ext to near full ROM to improve strength for eventual standing. 09/07/2022- Goal continues to not be appropriate but patient is now standing some and will keep goal active; 11/23/2022= patient is now performing sit to stand with max A +2 and now able to clear his bottom off mat. 11/30/2022- Patient continues to perform sit to stand transfers with Max A+2 and able to clear bottom well off mat but not erect yet. 05/31/2023- Patient is able to stand consistently with max A +2 - unable to achieve full knee or trunk ext yet is able to clear bottom off mat for improved LE weightbearing. 09/04/2023- Patient performed several Sit to stand requiring max A+2  with improving posture- able to ext R knee better - near terminal knee and able to clear bottom off mat- still not full but able to contract glutes for standing as erect as possible.     Time 12     Period Weeks     Status PROGRESSING    Target date  11/27/2023       PT LONG TERM GOAL #6  Title Patient will tolerate sitting unsupported demonstrating erect sitting posture for 30 minutes with CGA to demonstrate improved back extensor strength and improved sitting tolerance.   Baseline 12/13/2021= Patient demonstrated unsupported sitting at edge of mat for approx 20 min;  12/29/2021- Patient performed approx 30 min of dynamic sitting activities today. 06/06/2022= Patient demonstrated ability to sit and perform static and dynamic UE/LE movement with only Supervision.   Time 12   Period Weeks   Status GOAL MET           7.  Patient will tolerate 2 minutes or  more of standing in sit to stand lift or with max assist +2 in order to indicate improved lower extremity weightbearing tolerance for progression to standing in parallel bars. Baseline: 1 minute on most recent stand 02/21/22; 06/06/2022- Patient did attempt today after complaining of right LE pain last week. He attempted 3 stands using sit to stand lift. - all over 1 min- last one approx 48 sec- stopped due to fatigue. More erect standing in lift today - still poor gluteal strength but able to activate glutes and extend hips upon command but unable to hold > 5 sec. 07/28/2022- Will attempt standing next visit but patient has been able to stand since last progress note for up to 3 min at a time at his best. 09/07/2022-  attempted standing with max +2 and patient able to stand 15-20 sec so will now  revise goal; 11/23/2022- Patient at his best between last 2  visits was able to stand approx 50 sec with max A+2.  11/30/2022- Patient performed 4 trials of static standing - max A +2- clearing bottom and using forearms to bear weight  03/15/2023: Patient performed 2 trials of static standing - max A +2- clearing edge of mat and using forearms to bear weight for approximately 45 seconds, is able to come into approximately 45 degrees of knee flexion from sit to stand; 05/31/2023- Patient has not attempted standing in 1 month but able to perform x 4 trials with max A +2 (PT and his mom) and able to clear his bottom off mat and attempted to activate his glutes and apply some forearm pressure - able to stand between 25-40 sec today. 09/04/2023- Patient performed 3 trials of standing ranging from 17 -47 sec - improving right knee terminal knee ext and more activation upon command with gluteals- able to clear mat table. Continued to require Max A+2 and blocking knees with forearm/trunk support.   Goal status: PROGRESSING Target date: 11/27/2023   8. Patient will tolerate sitting unsupported demonstrating ability to perform  dynamic UE/LE activities for 30 minutes  independently to demonstrate ability to sit at edge of bed to eat or perform some ADL's/exercise for optimal quality of life.             Baseline: 06/06/2022- Patient able to sit and perform some UE/LE exercises but requires CGA at times for safety- he is able to static sit for 30 min with supervision. 07/27/2022= Patient able to now sit > 30 min with Supervision only - performing static and dynamic activities. Will keep goal active to ensure patient is consistent. 09/07/2022=  Patient able to demonstrate consistent ability to sit at edge of mat during visits             Goal Status: MET             Target date: 08/29/2022  9.  Patient will perform rolling left to right with Mod assist for improved independence with bed mobility to assist with dressing and positioning in bed. Baseline: 09/04/2023- Mother reported last visit that he was having some difficulty with assisting with bed mobility. Goal status: NEW Target date: 11/27/2023    10. Patient will perform active bridging -able to clear bottom   Off mat for 10 reps with PT/caregiver holding his feet    For improved trunk ext strength with standing and to    To assist with be mobility/positioning.  Baseline: 09/04/2023= (PT assisted- holding right LE In neutral and pressure on each foot) - Difficulty clearing bottom off mat. Goal status: NEW Target date: 11/27/2023           Plan     Clinical Impression Statement Patient was successfully able to use the Nustep with feet strapped- seat belt donned and some assist with RUE. He was able to propel the equipment with his legs and excited to elicit enough force to move his legs today. He was able to attain some trunk rotation and get some good scapular motion today on machine. He will benefit from further training to help improve his strength in order to potentially stand in future visits.   Pt will continue to benefit from skilled physical therapy intervention to  address impairments, improve QOL, and attain therapy goals.    Personal Factors and Comorbidities Comorbidity 3+;Time since onset of injury/illness/exacerbation    Comorbidities CVA, diabetes, Seizures    Examination-Activity Limitations Bathing;Bed Mobility;Bend;Caring for Others;Carry;Dressing;Hygiene/Grooming;Lift;Locomotion Level;Reach Overhead;Self Feeding;Sit;Squat;Stairs;Stand;Transfers;Toileting  Examination-Participation Restrictions Cleaning;Community Activity;Driving;Laundry;Medication Management;Meal Prep;Occupation;Personal Finances;Shop;Yard Work;Volunteer    Stability/Clinical Decision Making Evolving/Moderate complexity    Rehab Potential Fair    PT Frequency 1-2x / week    PT Duration 12 weeks    PT Treatment/Interventions ADLs/Self Care Home Management;Cryotherapy;Electrical Stimulation;Moist Heat;Ultrasound;DME Instruction;Gait training;Stair training;Functional mobility training;Therapeutic exercise;Balance training;Patient/family education;Orthotic Fit/Training;Neuromuscular re-education;Wheelchair mobility training;Manual techniques;Passive range of motion;Dry needling;Energy conservation;Taping;Visual/perceptual remediation/compensation;Joint Manipulations    PT Next Visit Plan core strength/motor control in short-sitting, sit to stand if able; Continue with progressive LE Strengthening. Stand activities as appropriate.  Nustep and potential standing in // bars as appropriate.    PT Home Exercise Plan No changes to HEP today    Consulted and Agree with Plan of Care Patient;Family member/caregiver    Family Member Consulted             03/09/2023, 8:04 AM      12:33 PM, 10/26/23 Reyes LOISE London PT Physical Therapist - [facility]  12:33 PM 10/26/23

## 2023-10-27 DIAGNOSIS — E1165 Type 2 diabetes mellitus with hyperglycemia: Secondary | ICD-10-CM | POA: Diagnosis not present

## 2023-10-27 DIAGNOSIS — G4733 Obstructive sleep apnea (adult) (pediatric): Secondary | ICD-10-CM | POA: Diagnosis not present

## 2023-10-27 DIAGNOSIS — I6930 Unspecified sequelae of cerebral infarction: Secondary | ICD-10-CM | POA: Diagnosis not present

## 2023-10-30 ENCOUNTER — Ambulatory Visit

## 2023-10-30 ENCOUNTER — Ambulatory Visit: Admitting: Occupational Therapy

## 2023-10-30 DIAGNOSIS — R2689 Other abnormalities of gait and mobility: Secondary | ICD-10-CM | POA: Diagnosis not present

## 2023-10-30 DIAGNOSIS — R2681 Unsteadiness on feet: Secondary | ICD-10-CM

## 2023-10-30 DIAGNOSIS — R269 Unspecified abnormalities of gait and mobility: Secondary | ICD-10-CM | POA: Diagnosis not present

## 2023-10-30 DIAGNOSIS — R262 Difficulty in walking, not elsewhere classified: Secondary | ICD-10-CM

## 2023-10-30 DIAGNOSIS — I6930 Unspecified sequelae of cerebral infarction: Secondary | ICD-10-CM | POA: Diagnosis not present

## 2023-10-30 DIAGNOSIS — M6281 Muscle weakness (generalized): Secondary | ICD-10-CM | POA: Diagnosis not present

## 2023-10-30 DIAGNOSIS — R278 Other lack of coordination: Secondary | ICD-10-CM

## 2023-10-30 NOTE — Therapy (Signed)
 OUTPATIENT PHYSICAL THERAPY TREATMENT/Physical Therapy Progress Note   Dates of reporting period  09/04/2023   to   10/30/2023     Patient Name: Paul Hall MRN: [id number] DOB:1995/09/09, 28 y.o., male Today's Date: 10/31/2023  PCP:  [facility] PROVIDER:  Morene Sous, PA-C   PT End of Session - 10/30/23 1538     Visit Number 170    Number of Visits 184   date corrected from most recent recert   Date for PT Re-Evaluation 11/27/23    Authorization Type [employer]/ [place] Medicaid    Authorization Time Period 01/04/21-03/29/21; Recert 03/24/2021-06/16/2021; Recert 09/15/2021- 12/08/2021; Recert 12/13/2021-03/07/2022    Progress Note Due on Visit 180    PT Start Time 1538    PT Stop Time 1615    PT Time Calculation (min) 37 min    Equipment Utilized During Treatment Gait belt    Activity Tolerance Patient tolerated treatment well    Behavior During Therapy WFL for tasks assessed/performed                              Past Medical History:  Diagnosis Date   Diabetes mellitus (HCC)    Hypertension    Stroke (HCC)    Past Surgical History:  Procedure Laterality Date   IVC FILTER PLACEMENT ([facility] HX)     LEG SURGERY     PEG TUBE PLACEMENT     TRACHEOSTOMY     Patient Active Problem List   Diagnosis Date Noted   Sepsis due to vancomycin  resistant Enterococcus species (HCC) 06/06/2019   SIRS (systemic inflammatory response syndrome) (HCC) 06/05/2019   Acute lower UTI 06/05/2019   VRE (vancomycin -resistant Enterococci) infection 06/05/2019   Anemia 06/05/2019   Skin ulcer of sacrum with necrosis of muscle (HCC)    Urinary retention    Type 2 diabetes mellitus without complication, with long-term current use of insulin  (HCC)    Tachycardia    Lower extremity edema    Acute metabolic encephalopathy    Obstructive sleep apnea    Morbid obesity with BMI of 60.0-69.9, adult (HCC)    Goals of care, counseling/discussion    Palliative care encounter     Sepsis (HCC) 04/27/2019   H/O insulin  dependent diabetes mellitus 04/27/2019   History of CVA with residual deficit 04/27/2019   Seizure disorder (HCC) 04/27/2019   Decubitus ulcer of sacral region, stage 4 (HCC) 04/27/2019    REFERRING DIAG: Cerebral infarction, unspecified   THERAPY DIAG:  Muscle weakness (generalized)  Other lack of coordination  Difficulty in walking, not elsewhere classified  Unsteadiness on feet  History of CVA with residual deficit  Other abnormalities of gait and mobility  Abnormality of gait and mobility  Rationale for Evaluation and Treatment Rehabilitation  PERTINENT HISTORY: Paul Hall is a 26yoM who presents with severe weakness, quadriparesis, altered sensorium, and visual impairment s/p critical illness and prolonged hospitalization. Pt hospitalized in October 2020 with ARDS 2/2 COVID19 infection. Pt sustained a complex and lengthy hospitalization which included tracheostomy, prolonged sedation, ECMO. In this period pt sustained CVA and SDH. Pt has now been liberated from tracheostomy and G-tube. Pt has since been hospitalized for wound infection and UTI. Pt lives with parents at home, has hospital bed and left chair, hoyer lift transfers, and power WC for mobility needs. Pt needs heavy physical assistance with ADL 2/2 BUE contractures and motor dysfunction   PRECAUTIONS: Fall  SUBJECTIVE:  Patient reports  feeling okay- states pleased with ability to perform Nustep.   PAIN:  Are you having pain? No. But feeling tight.    TODAY'S TREATMENT:  - 10/30/2023     TA:  -Patient was dependently transferred from power w/c to Nustep today with 2 person assist.  Adjusted seat and patient performed around 30 min total of Nustep with several short rest breaks. At least 12 min performed with Ace wrap TE:  Patient was fatigued at end of session yet spent majority of treatment pushing Nustep with LUE and BLE.    PATIENT EDUCATION: Education  details: Exercise technique with VC   HOME EXERCISE PROGRAM:  Access Code: [email or web address] URL: [email or web address] Date: 03/23/2022 Prepared by: Reyes London  Exercises - Supine Bridge  - 3 x weekly - 3 sets - 10 reps - 2 hold - Supine Gluteal Sets  - 3 x weekly - 3 sets - 10 reps - 5 sec hold - Supine Quad Set  - 1 x daily - 3 x weekly - 3 sets - 10 reps - 5 hold - Seated Long Arc Quad  - 1 x daily - 7 x weekly - 3 sets - 10 reps - Seated Hip Adduction Squeeze with Ball  - 1 x daily - 3 x weekly - 3 sets - 10 reps - 5 hold - Seated Hip Abduction  - 1 x daily - 3 x weekly - 3 sets - 10 reps - 2 hold    PT Short Term Goals -       PT SHORT TERM GOAL #1   Title Pt will be independent with HEP in order to improve strength and balance in order to decrease fall risk and improve function at home and work.    Baseline 01/04/2021= No formal HEP in place; 12/12 no HEP in place; 05/10/2021-Patient and his father were able to report compliance with curent HEP consisting of mostly seated/reclined LE strengthening. Both verbalize no questions at this time.    Time 6    Period Weeks    Status Achieved    Target Date 02/15/21            PT LONG TERM GOAL #1     Title Patient will increase BLE gross strength by 1/2 muscle grade to improve functional strength for improved independence with potential gait, increased standing tolerance and increased ADL ability.     Baseline 01/04/2021- Patient presents with 1/5 to 3-/5 B LE strength with MMT; 12/12: goal partially met for Left knee/hip; 05/10/2021= 2-/5 bilateal Hip flex; 3+/5 bilateral Knee ext; 06/21/2021= Patient presents with 2-/5 bilateral Hip flex; 3+/5 bilateral knee ext/flex; 2-/5 left ankle DF; 0/5 right ankle- and able to increase reps and resistance with LE's. 09/15/2021- Patient technically presents with 2-/5 B hip flex/abd/add - but he is able to raise his hip up to approx 100 deg which has improved. 3+/5 Bilateral knee ext,  2-/5 left ankle and 0/5 right ankle.  12/08/2021= Patient able to lift left knee at 110 deg of hip flex; presents with 3+/5 knee ext, 2-/5 left ankle DF and 0/5 right ankle DF, 2-/5 bilateral Hip abd in seated position.    12/6: R: knee 3+/5 ext, 2/5 flexion, left knee 3+/5 extension, 3+/5 flexion, R hip: 2+/5 hip add, 2+/5 hip ABD L hip: 4-/5 hip ABD, 3+/5 hip ADD, 3+/5 hip flexion; 06/06/2022= Patient now presents with 2-/5 right ankle DF/PF;     Time 12     Period Weeks  Status MET    Target Date 03/07/2022         PT LONG TERM GOAL #2    Title Patient will tolerate sitting unsupported demonstrating erect sitting posture for 15 minutes with CGA to demonstrate improved back extensor strength and improved sitting tolerance.     Baseline 01/04/2021- Patient confied to sitting in lift chair or electric power chair with back support and unable to sit upright without physical assistance; 12/12: tolerates <1 minutes upright unsupported sitting. 05/10/2021=static sit with forward trunk lean  in his power wheelchair without back support x approx 3 min. 06/21/2021=Unable to assess today due to patient with acute back pain but on previous visit able to sit x 8 min without back support. 09/15/2021- on last visit- 09/13/2021- patient was able to sit unsupported x 8 min at edge of mat. 10/13/2023 - Patient was able to sit at edge of mat with varying level of assist today from SBA to min A for a total of 20 min. 12/13/2021= Patient demonstrated unsupported sitting at edge of mat for approx 20 min    Time 12     Period Weeks     Status GOAL MET    Target Date 12/08/21          PT LONG TERM GOAL #3    Title Patient will demonstrate ability to perform static standing in // bars > 2 min with Max Assist  without loss of balance and fair posture for improved overall strength for pre-gait and transfer activities.     Baseline 01/04/2021= Patient current uanble to stand- Dependent on hoyer or sit to stand lift for transfers.  05/10/2021=Not appropriate yet- Currently still dependent with all transfers using hoyer. 06/21/2021= Patient continuing now to focus on LE strengthening to prepare for standing-unable to try today due to acute low back pain-  planning on attempting in new cert period. 09/15/2021- Patient has attempted standing 2x in past two week- max Assist of 2 people - only once was he successful to clearing his bottom from chair - Will continue to be a focus during the new certification. 12/13/2021= Patient has been limited secondary to increased overall low back pain during this certification and will require more time to focus on this goal.  12/6: not assessed this date, will assess at date when 2-3 PTs are present for assistance     Time 12     Period Weeks     Status GOAL not appropriate at this time - may attempt in future once Patient presents with improved overall LE strength.                 PT LONG TERM GOAL #4    Title Pt will improve FOTO score by 10 points or more demonstrating improved perceived functional ability     Baseline FOTO 7 on 10/17; 03/15/21: FOTO 12; 05/10/2021 06/21/2021= 1; 09/15/2021= 9; 12/13/2021= Will issue next visit 12/6: 4; 06/06/2022= Will assess next visit; Goal not appropriate as patient is not ambulatory.     Time 12     Period Weeks     Status WITHDRAWN    Target date 11/30/2022         PT LONG TERM GOAL #5    Title Patient will perform sit to stand transfer with appropriate AD and max assist of 2 people with 75% consistency to prepare for pregait activities.     Baseline 09/15/2021= Patient unable to stand well- unable to clear his bottom off chair  with Max assist of 2 persons. 12/13/2021- Goal not appropriate to try yet but will keep and roll over to next cert as shift continues to focus on transfers/standing; 4/24= Patient able to perform active ankle DF/PF with right LE and able to raise his knee into seated march and clear floor without physical assist today - as previously unable  as well as lift right knee ext to near full ROM to improve strength for eventual standing. 09/07/2022- Goal continues to not be appropriate but patient is now standing some and will keep goal active; 11/23/2022= patient is now performing sit to stand with max A +2 and now able to clear his bottom off mat. 11/30/2022- Patient continues to perform sit to stand transfers with Max A+2 and able to clear bottom well off mat but not erect yet. 05/31/2023- Patient is able to stand consistently with max A +2 - unable to achieve full knee or trunk ext yet is able to clear bottom off mat for improved LE weightbearing. 09/04/2023- Patient performed several Sit to stand requiring max A+2  with improving posture- able to ext R knee better - near terminal knee and able to clear bottom off mat- still not full but able to contract glutes for standing as erect as possible.     Time 12     Period Weeks     Status PROGRESSING    Target date  11/27/2023       PT LONG TERM GOAL #6  Title Patient will tolerate sitting unsupported demonstrating erect sitting posture for 30 minutes with CGA to demonstrate improved back extensor strength and improved sitting tolerance.   Baseline 12/13/2021= Patient demonstrated unsupported sitting at edge of mat for approx 20 min;  12/29/2021- Patient performed approx 30 min of dynamic sitting activities today. 06/06/2022= Patient demonstrated ability to sit and perform static and dynamic UE/LE movement with only Supervision.   Time 12   Period Weeks   Status GOAL MET           7.  Patient will tolerate 2 minutes or more of standing in sit to stand lift or with max assist +2 in order to indicate improved lower extremity weightbearing tolerance for progression to standing in parallel bars. Baseline: 1 minute on most recent stand 02/21/22; 06/06/2022- Patient did attempt today after complaining of right LE pain last week. He attempted 3 stands using sit to stand lift. - all over 1 min- last one approx  48 sec- stopped due to fatigue. More erect standing in lift today - still poor gluteal strength but able to activate glutes and extend hips upon command but unable to hold > 5 sec. 07/28/2022- Will attempt standing next visit but patient has been able to stand since last progress note for up to 3 min at a time at his best. 09/07/2022-  attempted standing with max +2 and patient able to stand 15-20 sec so will now  revise goal; 11/23/2022- Patient at his best between last 2 visits was able to stand approx 50 sec with max A+2.  11/30/2022- Patient performed 4 trials of static standing - max A +2- clearing bottom and using forearms to bear weight  03/15/2023: Patient performed 2 trials of static standing - max A +2- clearing edge of mat and using forearms to bear weight for approximately 45 seconds, is able to come into approximately 45 degrees of knee flexion from sit to stand; 05/31/2023- Patient has not attempted standing in 1 month but able to  perform x 4 trials with max A +2 (PT and his mom) and able to clear his bottom off mat and attempted to activate his glutes and apply some forearm pressure - able to stand between 25-40 sec today. 09/04/2023- Patient performed 3 trials of standing ranging from 17 -47 sec - improving right knee terminal knee ext and more activation upon command with gluteals- able to clear mat table. Continued to require Max A+2 and blocking knees with forearm/trunk support.   Goal status: PROGRESSING Target date: 11/27/2023   8. Patient will tolerate sitting unsupported demonstrating ability to perform dynamic UE/LE activities for 30 minutes  independently to demonstrate ability to sit at edge of bed to eat or perform some ADL's/exercise for optimal quality of life.             Baseline: 06/06/2022- Patient able to sit and perform some UE/LE exercises but requires CGA at times for safety- he is able to static sit for 30 min with supervision. 07/27/2022= Patient able to now sit > 30 min with  Supervision only - performing static and dynamic activities. Will keep goal active to ensure patient is consistent. 09/07/2022=  Patient able to demonstrate consistent ability to sit at edge of mat during visits             Goal Status: MET             Target date: 08/29/2022  9.  Patient will perform rolling left to right with Mod assist for improved independence with bed mobility to assist with dressing and positioning in bed. Baseline: 09/04/2023- Mother reported last visit that he was having some difficulty with assisting with bed mobility. Goal status: NEW Target date: 11/27/2023    10. Patient will perform active bridging -able to clear bottom   Off mat for 10 reps with PT/caregiver holding his feet    For improved trunk ext strength with standing and to    To assist with be mobility/positioning.  Baseline: 09/04/2023= (PT assisted- holding right LE In neutral and pressure on each foot) - Difficulty clearing bottom off mat. Goal status: NEW Target date: 11/27/2023           Plan     Clinical Impression Statement Patient was successfully able to use the Nustep for 2nd time- this time with only 2 person assist with much improved and efficient transfer onto equipment. He was able to spend quite a bit of time working on his leg and arm power/endurance with some rest due to fatigue. He was even able to perform some of treatment with use of RUE - ace-wrapped to keep in R arm attachment of Nustep. Overall he was able to keep working throughout session and focus on building some leg strength and endurance.  Pt will continue to benefit from skilled physical therapy intervention to address impairments, improve QOL, and attain therapy goals.    Personal Factors and Comorbidities Comorbidity 3+;Time since onset of injury/illness/exacerbation    Comorbidities CVA, diabetes, Seizures    Examination-Activity Limitations Bathing;Bed Mobility;Bend;Caring for Others;Carry;Dressing;Hygiene/Grooming;Lift;Locomotion  Level;Reach Overhead;Self Feeding;Sit;Squat;Stairs;Stand;Transfers;Toileting    Examination-Participation Restrictions Cleaning;Community Activity;Driving;Laundry;Medication Management;Meal Prep;Occupation;Personal Finances;Shop;Yard Work;Volunteer    Stability/Clinical Decision Making Evolving/Moderate complexity    Rehab Potential Fair    PT Frequency 1-2x / week    PT Duration 12 weeks    PT Treatment/Interventions ADLs/Self Care Home Management;Cryotherapy;Electrical Stimulation;Moist Heat;Ultrasound;DME Instruction;Gait training;Stair training;Functional mobility training;Therapeutic exercise;Balance training;Patient/family education;Orthotic Fit/Training;Neuromuscular re-education;Wheelchair mobility training;Manual techniques;Passive range of motion;Dry needling;Energy conservation;Taping;Visual/perceptual remediation/compensation;Joint Manipulations  PT Next Visit Plan core strength/motor control in short-sitting, sit to stand if able; Continue with progressive LE Strengthening. Stand activities as appropriate.  Nustep and potential standing in // bars as appropriate.    PT Home Exercise Plan No changes to HEP today    Consulted and Agree with Plan of Care Patient;Family member/caregiver    Family Member Consulted             03/09/2023, 8:04 AM      2:22 PM, 10/31/23 Reyes LOISE London PT Physical Therapist - [facility]  2:22 PM 10/31/23

## 2023-10-31 NOTE — Therapy (Addendum)
 . Occupational Therapy Treatment Note    Patient Name: Paul Hall MRN: [id number] DOB:Mar 30, 1996, 28 y.o., male Today's Date: 10/31/2023  PCP: Dr. Bertell REFERRING PROVIDER: Dr. Bertell   .   OT End of Session - 10/31/23 0832     Visit Number [id number]    Number of Visits 180    Date for OT Re-Evaluation 01/16/24    OT Start Time 1615    OT Stop Time 1700    OT Time Calculation (min) 45 min    Activity Tolerance Patient tolerated treatment well    Behavior During Therapy WFL for tasks assessed/performed                 Past Medical History:  Diagnosis Date   Diabetes mellitus (HCC)    Hypertension    Stroke [facility])    Past Surgical History:  Procedure Laterality Date   IVC FILTER PLACEMENT ([facility] HX)     LEG SURGERY     PEG TUBE PLACEMENT     TRACHEOSTOMY     Patient Active Problem List   Diagnosis Date Noted   Sepsis due to vancomycin  resistant Enterococcus species (HCC) 06/06/2019   SIRS (systemic inflammatory response syndrome) (HCC) 06/05/2019   Acute lower UTI 06/05/2019   VRE (vancomycin -resistant Enterococci) infection 06/05/2019   Anemia 06/05/2019   Skin ulcer of sacrum with necrosis of muscle (HCC)    Urinary retention    Type 2 diabetes mellitus without complication, with long-term current use of insulin  (HCC)    Tachycardia    Lower extremity edema    Acute metabolic encephalopathy    Obstructive sleep apnea    Morbid obesity with BMI of 60.0-69.9, adult (HCC)    Goals of care, counseling/discussion    Palliative care encounter    Sepsis (HCC) 04/27/2019   H/O insulin  dependent diabetes mellitus 04/27/2019   History of CVA with residual deficit 04/27/2019   Seizure disorder (HCC) 04/27/2019   Decubitus ulcer of sacral region, stage 4 (HCC) 04/27/2019   ONSET DATE: 01/2019  REFERRING DIAG: CVA/COVID-19  THERAPY DIAG:  Muscle weakness (generalized)  Other lack of coordination  Rationale for Evaluation and Treatment  Rehabilitation  SUBJECTIVE:  Pt.Caregiver reports that pt. has been working on grasping standard utensils with L hand from tabletop surface during self-feeding tasks at home.   SUBJECTIVE STATEMENT:  Pt.  reports doing well today  Pt accompanied by: self and family member  PERTINENT HISTORY:  Pt. is a 28 y.o. male who was diagnosed with COVID-19, and CVA with resultant quadriplegia. Pt. Was then hospitalized with VRE UTI. PMHx includes: urinary retention, seizure disorder, obstructive sleep disorder, DM Type II, Morbid obesity.   PRECAUTIONS: None  WEIGHT BEARING RESTRICTIONS No  PAIN:  No reports of pain Are you having pain? 1-2/10 pain in L shoulder  FALLS: Has patient fallen in last 6 months? No  LIVING ENVIRONMENT: Lives with: lives with their family Lives in: House/apartment Stairs: No Level Entry Has following equipment at home: Wheelchair (power) and hoyer lift, sit to stand lift  PLOF: Independent  PATIENT GOALS: To be able to engage in more daily care tasks.  OBJECTIVE:   HAND DOMINANCE: Right  ADLs:  Transfers/ambulation related to ADLs: Eating: Pt. reports being able to hold standard utensils, and is starting to engage more in self-feeding tasks, hand to mouth patterns. Pt. Reports that he does as much as he can with the task, and family assists  with the remainder of the task. Grooming:  Pt. is able to initiate holding an electric toothbrush, and brush his teeth. Family assists LB Dressing: Total Assist UE dressing: Pt. is now able to reach up to actively assist with grasping , and pulling his gown down. Toileting:  Total Assist Bathing: MaxA UB, Total assist LB Tub Shower transfers: N/A Equipment: See above    IADLs: Shopping: Relies on family to assist Light housekeeping: Total Assist Meal Prep: Total Assist Community mobility:   Medication management:  Total Assist  Financial management: N/A Handwriting: Not legible: Pt. Is able to hold a pen with  the left hand, and initiate marking the page. Pt.'s eye glasses were not available  MOBILITY STATUS:  Power w/c  POSTURE COMMENTS:  Pt. Requires position changes in his power w/c  ACTIVITY TOLERANCE: Activity tolerance:  Fair  FUNCTIONAL OUTCOME MEASURES: FOTO: 40 TR score: 45  05/25/2022:   FOTO: 50 TR score: 45  07/20/22: FOTO 55  03/08/23: 51  UPPER EXTREMITY ROM     Active ROM Left eval Left  03/21/2022 Left 05/25/2022 L  07/20/22 Left 07/25/2022 Left  08/15/2022 Left  09/26/2022 Left  11/16/2022 Left 12/28/2022 Left 03/13/23 Left 05/10/23 Left 10/23/23  Shoulder flexion 119 123 125 130  136 138 130 142 144 144 148  Shoulder abduction 110 115 115 115  118 121 125 142 140 140 140  Shoulder adduction              Shoulder extension              Shoulder internal rotation              Shoulder external rotation              Elbow flexion 135(135) 145 145 145  145    145 145   Elbow extension -27(-18) -21(-20) -20(-16)  -25(-10) -23(-10) -21(-10) -20(-18) -20(-18) -20(-17) -17(-16) -12(-8)  Wrist flexion              Wrist extension 10(50) 19(50) 28(50)  30(50) 35(55) 36(55) 36(60) 40(60) 45(60) 45(60) 42(54)  Wrist ulnar deviation              Wrist radial deviation              Wrist pronation            60(74)  Wrist supination   Limited by flexor tone       10(15)  30(38)  (Blank rows = not tested)    Active ROM Right eval Right 03/21/2022 Right 05/25/2022 R  07/20/22 Right  07/25/22 Right 08/15/2022 Right 09/26/2022 Right 11/16/2022 Right 12/28/2022 Right 03/13/23 Right 05/10/23 Right 10/23/23  Shoulder flexion 106 scaption 105  Scaption 62 flexion 110 scaption 100  105 105 105 112 Scaption 123 Scaption 122  Scaption 142 Scaption  Shoulder abduction 114 90 97 100  105 117 120 124 124 122 144  Shoulder adduction              Shoulder extension              Shoulder internal rotation              Shoulder external rotation              Elbow flexion 120(130)  145 145 140  144 140 142 148 148 145   Elbow extension -45(-35) -22(-35) -28(-22)  -32(-21) -32(-28) -28(-26) -28(-28) -27(-26) -27(-40) -38(-35) -34(-26)  Wrist flexion  Wrist extension -30(10) -25(10) -10(30)  -10(20) -10(25) -20(40) -30(10) -22(34) -22(35) -32(22) -30(-8)  Wrist ulnar deviation              Wrist radial deviation              Wrist pronation              Wrist supination   Limited by flexor tone                12/28/2022: Thumb radial abduction: Right: 12(46), Left: 40(54)  Digits: Bilateral 2nd through 5th digit: MP Hyperextension with PIP, an DIP flexion    10/23/23:  Bilateral 2nd through 5th digit MP hyperextension with PIP, and DIP flexion: Pt. has been intermittently able to initate active diigt PIP, and DIP extension following Botox during this past recertification/progress reporting period.     UPPER EXTREMITY MMT:     Left eval Left 03/21/2022 Left  05/25/2022 L 07/20/22 Left  08/15/2022 Left 09/26/2022 Left 11/16/2022 Left 12/28/2022 Left  03/13/2023 Left  05/10/23 Left 10/23/23  Shoulder flexion 3-/5 3/5 3+/5 4+/5 4+/5 4+/5 4+/5 4+/5 4+/5 4+/5 4+/5  Shoulder abduction 3-/5 3-/5 4-/5 4+/5 4+/5 4+5 4+/5 4+/5 4+/5 4+/5 4+/5  Shoulder adduction             Shoulder extension             Shoulder internal rotation             Shoulder external rotation             Elbow flexion 3/5 3+/5 4-/5 4+/5 5/5 5/5 5/5 5/5 5/5 5/5 5/5  Elbow extension 2-/5 2/5  4+/5 4+/5 4+/5  4/5 4/5 4/5 through available range 4+/5 through available range  Wrist flexion             Wrist extension 2/5 2+/5 3-/5  3-/5 3-/5 3-/5 3/5 3/5 3/5 3+/5  Wrist ulnar deviation             Wrist radial deviation             Wrist pronation             Wrist supination             (Blank rows = not tested)  Right eval Right 03/21/2022 Right  05/25/2022 R 07/20/22 Right 08/15/2022 Right 09/26/2022 Right 11/16/2022 Right 12/28/2022 Right 03/13/2023 Right 05/10/23 Right 10/23/23   Shoulder flexion 3-/5 scaption 3-/5 3-/5 3+/5 3+/5 3+/5 3/5 3/5 3/5 3+/5 4-/5  Shoulder abduction 3-/5 3-/5 3-/5 4/5 4/5 4/5 4/5 4/5 4/5 4/-5 4-/5  Shoulder adduction             Shoulder extension             Shoulder internal rotation             Shoulder external rotation             Elbow flexion 3/5 3/5 3+/5 4/5 4+/5 4+/5 4+/5 4+/5 4+/5 4+/5 4+/5  Elbow extension 2-/5 2/5 3-/5 4+/5 4+/5 4+/5 4+/5 4-/5 4-/5 4-/5 4-/5 through available range  Wrist flexion             Wrist extension 2-/5 2-/5 2/5 2/5 2/5 2/5 2/5 2/5 2/5 2/5 2/5  Wrist ulnar deviation             Wrist radial deviation             Wrist pronation  Wrist supination               HAND FUNCTION:  Grip strength: Right: 0 lbs; Left: 0 lbs and Lateral pinch: Right: 5 lbs, Left: 2 lbs  03/21/2022: Lateral pinch: Right: 3.5 lbs, Left: 2 lbs  07/20/22: Grip strength: Right: 3 lbs; Left: 4 lbs and Lateral pinch: Right: 5 lbs, Left: 3 lbs  08/15/2022:  Grip strength: Right: 0 lbs; Left: 5 lbs and Lateral pinch: Right: 2 lbs, Left: 4 lbs  09/26/2022  Grip strength: Right: 0 lbs; Left: 8 lbs and Lateral pinch: Right: 2 lbs, Left: 5 lbs  11/16/2022  Grip strength: Right: 0 lbs; Left: 8 lbs  9/25/20204  Grip strength: Right: 0 lbs; Left: 8 lbs  Grip strength: Right: 0 lbs; Left: 8 lbs   03/08/23:  Grip strength: Right: 0 lbs without PROM, 5 lbs after PROM, Left 8 lbs  05/10/23:  Grip strength: TBD  Bilateral digit PIP/DIP flexion contractures with MP hyperextension with attempts for AROM. Pt. is able to tolerate AROM to the bilateral digits at the initial evaluation however, has a history of pain in the digits.  COORDINATION: Eval: Pt. is unable to grasp 9-hole test pegs. Pt. is able to initiate grasping larger pegs, and is able to hold a pen in the left hand.  07/20/22: 2 min 36 seconds to remove 9 pegs from 9 hole peg test - cues to locate pegs 2/2 low vision. Pt. is able to initiate grasping  larger pegs on R hand and is able to hold a pen in the left hand.  08/15/2022:    Vision, and sensation limiting accuracy of 9 hole peg test results. Pt. Was able to grasp and remove vertical pegs intermittently with cues.    05/10/23: TBD  SENSATION: Light touch: Impaired   EDEMA:  N/A  MUSCLE TONE: BUE flexor Spasticity  COGNITION Overall cognitive status: Continue to assess in functional context  VISION:   Subjective report: Pt. was not wearing glasses at the time of the initial eval.  Baseline vision: Vision is very limited. Wears glasses all the time Visual history: History of impaired vision following CVA. Pt. Has received treatment through the [facility] low vision rehabilitation program.   VISION ASSESSMENT: Impaired To be further assessed in functional context  PERCEPTION: Impaired   PRAXIS: Impaired: motor planning  OBSERVATIONS:  Pt reports being on Tramadol   TODAY'S TREATMENT 10/30/23   Therapeutic Ex.:  -BUE PROM was performed with slow prolonged gentle stretching of bilateral hands including wrist/digit flexion/extension including Mps, PIPs, DIPs of all digits, and wrist flexion/extension.  -Pt. Performed LUE AROM/PROM/AAROM shoulder flexion, shoulder abduction, horizontal abduction, and elbow extension.    Self-Care:  -Pt. Simulated self feeding task, using LUE to alternate grasping onto standard/adaptive utensils to grasping onto various sized circular foam discs from sectional plate.  -Pt. Grasped  various sized lightweight circular foam discs using the LUE, following with simulating dipping the discs into another section of the sectional plate, in preparation for hand to mouth patterns.  -To further challenge sustained grasp, Pt. Sustained grasped onto various sized foam blocks in combination with reaching out to perform elbow extension, horizontal abduction, shoulder abduction, and shoulder flexion followed by releasing  to discard the circular foam blocks onto  raised tabletop surface. -Pt. Performed L AROM forearm supination during self-feeding task using the LUE with ModA.     PATIENT EDUCATION: Education details: Incorporating Active L hand supination during self-feeding tasks, grasping and releasing utensils,  grasping snack foods from plate. Person educated: Patient and Parent Education method: Explanation Education comprehension: verbalized understanding  HOME EXERCISE PROGRAM:    Incorporating L hand supination during feeding tasks, grasping/releasing utensils at home, using BUE during feeding tasks.   GOALS: Goals reviewed with patient? Yes  SHORT TERM GOALS: Target date: 12/05/2023   To assess splint fit, and make appropriate adjustments to promote good skin integrity through the palmar surface of the bilateral hands.  Baseline: 05/25/22: Goal currently met, however ongoing as needs to assess splint fit arise. 03/23/2022: Pt. is wearing splints a couple of hours at night bilateral resting hand splints. 03/21/2022: Pt. is wearing splints a couple of hours at night bilateral resting hand splints. Goal status: Deferred   LONG TERM GOALS: Target date: 01/15/2024   FOTO score will Improve by 2 points for Pt. perceived improvement with the assessment specific ADL/IADL tasks.  Baseline: 03/08/23: 51; 12/28/2022: FOTO score: 56; 11/16/2022: 52 09/27/2022: 51 07/20/2022: FOTO 55 05/25/2022: FOTO score: 50,  TR score: 45 Eval: FOTO score: 40, TR score: 45 Goal status: Achieved   2.   Pt. will independently perform oral care for 100% of the task after complete set-up. Baseline: 05/10/23: Pt. Performs 90% of the task. Pt.'s mother assists with 10% of the task for thorough cleaning the hard to reach places way in the back of the oral cavity.03/08/23: not as much assist required proximally, but to adjust positioning of toothbrush; 02/13/23: Pt. Continues to require assist proximally 01/09/2023: Continue 12/28/2022:  Pt. Continues to complete 90% of the task  with complete set-up, with visual cues, and occassional assist of the right elbow. 11/16/2022: Pt. Completes 90% of the task with complete set-up. 09/26/2022: Completes 90% of the task, proximal assist at times required at the elbow.  08/15/2022: Completes 90% of the task, proximal assist at times required at the elbow. 07/20/22: completes 90% of task, limited by shoulder flexion. 05/25/2022:  Pt. Is able to initiate and perform oral care for approximately 90% of the task. Complete set-up required. Assi needed only for the very back teeth. 03/23/2022: Pt. Is able to initiate and perform oral care for approximately 75% of the task. Pt. Requires assist at proximally at the elbow for through oral care. 03/21/2022: Pt. Is able to initiate and perform oral care for approximately 75% of the task. Pt. Requires assist at proximally at the elbow for through oral care. Eval: Pt. is able to initiate using an electric toothbrush. Pt. requires assist for set-up, and assist for thoroughness, and as he Pt. fatigues. Goal status:  Partially met, Goal discontinued  3.  Pt. Will be modified independence with self-feeding for 100% using a swivel spoon, and standard fork after complete set-up Baseline: 05/10/2023: Pt. feeds self 95-100% of the meal. with occasional assist to reposition the utensil. 03/08/23: indep with cup with a handle and lid, assist to reposition eating utensils in hand;  02/13/23:  Pt. Is now able to consistently, and efficiently use a swivel spoon for eating cereal with the left hand.01/09/2023: Continue 12/28/2022: Pt. Is now feeding himself 90% of the time using a swivel spoon with the left hand, and 95% of the time with the fork. Pt. Continues to have difficulty with cutting food. 11/16/2022: 100% with finger foods, Pt. Is initiating with a swivel spoon, and universal cuff for fork use, however requires assist for consistency, and accuracy.  09/26/2022: 90% using the spoon, assist at time with the forearm motion, and  grip on the utensils.  100% for finger foods.  08/15/2022:  90% using the spoon, assit at time with the forearm motion, and grip on the utensils. 100% for finger foods-independent with set-up- unsupervised.  07/20/22: self-feeds cereal using spoon 90% of task. 05/25/2022: Pt. Is able to use a spoon to scoop cereal when feeding himself cereal 85% of the time. Pt. Is able to feed himself snack/finger foods 100% of the time. Pt. Continues to work on consistency of  stabilizing a cup/mug when drinking. Pt. Is able to grasp a water  bottle with assist initially, with assist tapering off as he drinks.03/23/2022: Pt. Is able to perform scooping cereal for 75% of the time. Pt. required assist, and support at the left elbow, and Pt. Presents with limited forearm supination when using the spoon, and bringing it towards his mouth. Pt. Is able to use a fork to spear items, and perform the hand to mouth pattern.  03/21/2022: Pt. Is able to perform scooping cereal for 75% of the time. Pt. required assist, and support at the left elbow, and Pt. presents with limited forearm supination when using the spoon, and bringing it towards his mouth. Pt. Is able to use a fork to spear items, and perform the hand to mouth pattern.  Eval: Pt. is able to hold standard standard utensils. Pt. Performs as much of the task as he, can and has assistance for the remainder. Goal status:  Improved, Achieved  4.  Pt. will improve grasp patterns and consistently grasp 1/4 objects for ADL, and IADL tasks.  Baseline: 10/23/2023: Pt. is able to initiate grasping small objects visual cues, and increased tim, however continues to work towards grasping 1/4 objects efficiently. 08/27/23: Pt. is able to grasp flat discs, and sustain a secure hold on them while moving the UE through various planes. 08/07/23: Continue 05/31/2023: Continue 05/10/23: Pt. is consistently able to grasp 1/2 objects with visual cues. Pt. Requires assist, and increased visual cues to grasp  1/4 objects on an elevated plane.  Pt. Is unable to grasp the flat objects at the tabletop surface. Pt. Is grasping grasping small puppy vitamins.03/08/23: 1/2 objects efficiently; 02/13/2023: 1/2 objects efficiently 01/09/2023: Continue 12/28/2022: Continues to grasp 1/4 pegs with min a + visual cues, consistently grasping 1/2 objects with visual cues 11/16/2022: Continues to grasp 1/4 pegs with min a + visual cues, consistently grasping 1/2 objects with visual cues 09/26/2022: Continues to grasp 1/4 pegs with min a + visual cues, consistently grasping 1/2 objects with visual cues. 08/15/2022: grasps 1/4 pegs with min a + visual cues, consistently grasping 1/2 objects with visual cues. 07/20/22: grasps 1/4 pegs with min a + visual cues, consistently grasping 1/2 objects with visual cues. 05/25/2022: Pt. Is working on improving consistency of grasping 1/2 objects with visual cues.  03/23/2022: Pt. Is able to grasp 1 objects consistently,and continues to work on the hand patterns needed to grasp 1/2 objects.03/21/2022: Pt. Is able to grasp 1 objects consistently,and continues to work on the hand patterns needed to grasp 1/2 objects. Eval: Pt. is able to grasp 1 objects intermittently using a lateral grasp pattern. Goal status: Ongoing  5.  Pt. will independently write his name legibly with letter sizes under 1. Baseline: 05/10/23: Deferred 03/08/23: Not addressed this period; 02/13/2023: Continue 01/09/2023: Continue. 12/28/2022: Pt. is now using an IPad Pen. 11/16/2022: Pt. Continues to be able to write name with smaller letter size. Pt. Is signing name with a computer styus. Pt. Requires visual cues, and assist 09/26/2022: Pt. Continues to  be able to write name with smaller letter size. Pt. Is signing name with a computer styus. Pt. Requires visual cues, and assist. 08/15/2022: Pt. Is able to write name with smaller letter size. Pt. Is signing name with a computer styus. Pt. Requires visual cues, and  assist. 07/20/22: stabilizing assist to write name with 75% legibility with 2 letters. 05/25/2022: Pt. to continue to work towards formulating grasp patterns in preparation for grasping a large width pen. Pt. Requires visual cues. 03/23/2022: Pt. Is able to write his name with modA, however has difficulty with formulating letter sizes less than 2 in size with 50% legibility for the 3 letters of his name.03/21/2022: Pt. Is able to write his name with modA, however has difficulty with formulating letter sizes less than 2 in size with 50% legibility for the 3 letters of his name. Eval: Pt is able to hold a thin marker with his left hand, and formulate a line, and initiate a circular pattern (Pt. without glasses today) Goal status: Deferred  6. Pt. Will reach up to comb/brush his hair with minA.  Baseline: 05/10/23: Pt. is able to use a pick independently with the left hand with occasional assist to the far side of the left his head. 03/08/23: Pt uses pick in L hand to reach 75% of his head; unable to reach R side of head; 02/13/2023:Pt. is able to use a hair pick with the left hand on the right side of his head, top, and back of the head. Pt. Continues to have difficulty reaching further to the right side of his head with the left. 01/09/2023: Continue 12/28/2022: Pt. Is progressing, and is now able to use a hair pick with the left hand on the right side of his head, top, and back of the head. Pt. Continues to have difficulty reaching further to the right side of his head with the left. 11/16/2022: Pt. Is able to use a hair pick for the left side of his head using his left hand. Pt. Presents with difficulty reaching to the right side of his head.09/26/2022: Pt. Is able to use a hair pick for the left side of his head using his left hand. Pt. Presents with difficulty reaching to the right side of his head. 5/13/202: 75% using a hair pick, assist to the right side of the head. 07/20/22: reaches 75% of head, assist for far  R side of head, fatigues quickly. 05/25/2022: Pt. Is able to reach up with the left hand to the left side, top, and back of his head. 03/23/2022: Pt. is now able to more consistently initiate reaching up to his head with his left hand in preparation for haircare12/18/2023: Pt. is now able to more consistently initiate reaching up to his head with his left hand in preparation for haircare. Eval: Pt. is able to initiate reaching up for hair care with a long handled brush, however is unable to sustain UE's in elevation to perform the task.     Goal status: Achieved  7. Pt. Will independently navigate the w/c through his environment with minA with visual scanning, and hand placement on the controls.  Baseline: 11/16/2022: Deferred 2/2 vision 09/26/2022: Pt. Is now navigating his w/c ouside the home, and around his block. 08/15/2022: Pt. Requires minA to setup hand on controls and MIN cues to navigate the w/c in wide spaces, requires MIN - MOD cues to navigate the w/c through more narrow doorways, and tighter turns - varies based on good vs bad  vision days.07/20/22: Pt. Requires minA to setup hand on controls and MIN cues to navigate the w/c in wide spaces, requires MIN - MOD cues to navigate the w/c through more narrow doorways, and tighter turns - varies based on good vs bad vision days. 05/25/2022: Pt. Requires minA  and  cues to navigate the w/c in wide spaces, and requires Mod cues to navigate the w/c through more narrow doorways, and tighter turns. Pt. Requires max cues for scanning through the environment, and moderate cues for hand placement on the controls.     Goal status: Deferred 2/2 vision    8. Pt. Will improve bilateral grip strength to be able to independently grasp, and pull up blankets, and linens.   Baseline: 05/10/23: Deferred 03/08/23: R grip 0-5 lbs, L grip 8 lbs; pt reports he can consistently pull up blankets and linens independently.  02/13/2023: Continue. Pt. presents with digit PIP, and DIP  flexor tightness. 01/09/2023: Continue 12/28/2023: R: 0#, L: 5# Pt. Is now able to more consistently grasp and pull blankets up over him. 11/16/2022: R: 0 L: 8# Pt. Is more consistently grasping his blankets and attempting to pull them up. 09/26/2022: R: 0  L: 8# Pt. is attempting to grasp, and pull blankets up more, using mostly the left hand. 08/15/2022: Pt. has difficulty securely holding, and pulling up blankets, and linens.    Goal status:Deferred  9. Pt. will consistently actively control the releasing of blankets, covers, and linens from his hands once they are in the desired position over him. Baseline:12/28/2022: Pt. Is consistently actively able to release linens once they are in the desired position over him. 11/16/2022: Pt. continues to improve with actively releasing blankets form his hands. 09/26/2022: Pt. is improving with actively releasing blankets form his hands. 08/15/2022: Pt. has difficulty with controlled releasing of blankets/linens from his grasp.     Goal status: Achieved    10. Pt. Will improve bilateral UE strength by 2 MM grades to assist with ADLs, and IADLs  Baseline: 10/23/2023: Right: shoulder flexion: 4-/5, abduction: 4-/5, elbow flexion: 4+/5 , extension: 4-/5 wrist extension: 2/5; Left: shoulder flexion: 4+/5, abduction: 4+/5, elbow flexion: 5/5 , extension: 4+/5  wrist extension: 3+/5  08/27/23:  Right: shoulder flexion: 3+/5, abduction: 4-/5, elbow flexion: 4+/5 , extension: 4-/5 wrist extension: 2/5; Left: shoulder flexion: 4+/5, abduction: 4+/5, elbow flexion: 5/5 , extension: 4/5  wrist extension: 3/5  08/07/23: Continue 05/31/2023: Continue 05/10/23: Right: shoulder flexion: 3+/5, abduction: 4-/5, elbow flexion: 4+/5 , extension: 4-/5 wrist extension: 2/5; Left: shoulder flexion: 4+/5, abduction: 4+/5, elbow flexion: 5/5 , extension: 4/5  wrist extension: 3/5 03/13/2023: Right: shoulder flexion: 3/5, abduction: 4/5, elbow flexion: 4+/5 , extension: 4-/5 wrist extension: 2/5; Left:  shoulder flexion: 4+/5, abduction: 4+/5, elbow flexion: 5/5 , extension: 4/5  wrist extension: 3/5 03/08/23: NT this date d/t as pt arrived late; 02/13/2023: Continue 01/09/2023: Continue 12/28/2022: Right: shoulder flexion: 3/5, abduction: 4/5, elbow flexion: 4+/5 , extension: 4-/5 wrist extension: 2/5; Left: shoulder flexion: 4+/5, abduction: 4+/5, elbow flexion: 5/5 , extension: 4/5  wrist extension: 3/5 Right: shoulder flexion: 3+/5, abduction: 4/5, elbow flexion: 4+/5 , extension: 4+/5 wrist extension: 2/5; Left: shoulder flexion: 4+/5, abduction: 4+/5, elbow flexion: 5/5 , extension: 4+/5 wrist extension: 3-/5 11/16/2022: Right: shoulder flexion: 3/5, abduction: 4/5, elbow flexion: 4+/5 , extension: 4+/5 wrist extension: 2/5; Left: shoulder flexion: 4+/5, abduction: 4+/5, elbow flexion: 5/5 , extension:  wrist extension: 3-/5 Right: shoulder flexion: 3+/5, abduction: 4/5, elbow flexion: 4+/5 , extension:  4+/5 wrist extension: 2/5; Left: shoulder flexion: 4+/5, abduction: 4+/5, elbow flexion: 5/5 , extension: 4+/5 wrist extension: 3-/5 Goal status: Ongoing  11. Pt. will improve right wrist extension by 10 degrees to initiate reaching for items in preparation for ADLs. Baseline: 10/23/23: -30(-8) 08/23/2023: R Wrist Ext: -31(-8)  08/07/23: Continue 05/31/23: Continue 05/10/23: Right wrist extension: -32(22) 03/13/23: -22(35) 03/08/23: NT this date as pt arrived late; 02/13/2023: Continue 01/09/2023: Continue 12/28/2022: -22(34) 11/16/2022: -30(10)    Goal status: Ongoing      12. Pt. Will require donn/doff a Tank top T-shirt efficiently with supervision and full set-up.    Baseline:10/23/23: Independent doff the shirt/tank, MaxA  donning. 10/23/23: Pt. Is able to independently doff his shirt, requiring assist to lean forward and loosen the back. 08/07/23: Continue 05/31/2023: Pt. Requires maxA donning a pullover sweatshirt, Mod-maxA doffing it. T-shirt tank TBD 05/10/23: Pt. Presents with difficulty completing, and  requires increased time to complete    Goal status: Ongoing    13. Pt. Will independently perform bilateral/bimanual hand tasks needed to fold towels/linens/laundry Baseline: 10/23/23: Pt. Continues to have difficulty folding laundry, and linens. 08/23/23: Pt. Continues to have difficulty using bilateral/bimanual hand tasks needed to fold towels/linens/laundry. 08/07/23: Continue 05/31/23: Continue 05/10/23:  Pt. Has difficulty folding towels/lines/laundry using bilateral hands Goal status:Ongoing  14. Pt. Will independently, and efficiently reach his right arm over the side of the chair to pet his dog.    Baseline: 08/23/2023: Pt. Is now able to reach over the side of the armrest of his recliner chair to pet his dog.  08/07/23: Continue 05/31/2023: Continue 05/10/23: Pt. is unable to efficiently reach his right UE over the side of his recliner chair to pet his dog.    Goal status: Achieved    15. Pt. Will independently, and efficiently reach out in front of him to efficiently place items onto his table while sitting in his recliner.     Baseline: 10/23/23: Pt. Is improving with functional reaching with the left UE out in front of him to place items onto the table. Pt. has difficulty reaching out with the right hand to  place items on the table. 08/23/2023: Pt. has difficulty consistently reaching out in front of him to place items on the on a tabletop surface with the right hand. 08/07/23: Continue 2/26/225: Continue 05/10/23: Pt. has difficulty reaching out in front of him far enough to efficiently place items on the table while sitting in a recliner chair.    Goal status: Ongoing     16. Pt. Will improve bilateral hand supination by 10 degrees to be able  actively turn items in his hand to look at them independently and efficiently during ADLs/IADLs,    Baseline: 10/23/23: Right: unable, L: supination 30(38)    Goal status: New      17. Pt. Will independently, and efficiently transition between picking up  finger foods for dipping, and  picking up utensils , then placing them down on the tabletop surface through 75% of the meal with minimal cues.    Baseline: Pt.requires heavy cues, and assist to grasp finger foods, and utensils, and transition between the two.    Goal status: New   ASSESSMENT:  CLINICAL IMPRESSION:  Pt. is engaging his bilateral UEs, and  hands during more tasks at home. Pt. presents with difficulty grasping various sized foam discs from sectional plate, and grasping standard utensils from a raised tabletop surface, during self-feeding tasks. Pt. Presents with increased stiffness at the  L shoulder today, requiring 3 rest breaks to stretch shoulder during AAROM/PROM in shoulder flexion and shoulder abduction. Pt. Reports 1-2/10 pain in the L shoulder today after treatment session. Pt. Required Mod A, with support proximally at the L wrist and elbow while performing elbow extension and hand to mouth patterns, as well as performing AROM forearm supination. Pt. Has difficulty stabilizing utensil with L hand, and  attempting to adjust the utensil using the R hand for proper placement in R hand in preparation for scooping into sectional plate. Pt. has improved sustained grasp while efficiently transitioning between grasping finger foods, dipping them, and picking up utensils to use, and placing them down. Pt. Performed L AROM forearm supination during self-feeding tasks using the LUE, being able to complete 50% of the ROM. Pt.'s caregivers continue to be very supportive to continue working towards improving BUE functioning, ROM, strength, motor control, [facility] skills, as well as visual compensatory strategies in order to maximize independence with daily ADLs, and IADL functioning.     PERFORMANCE DEFICITS in functional skills including ADLs, IADLs, coordination, dexterity, proprioception, ROM, strength, pain, FMC, GMC, decreased knowledge of use of DME, and UE functional use, cognitive skills  including safety awareness, and psychosocial skills including coping strategies, environmental adaptation, habits, and routines and behaviors.   IMPAIRMENTS are limiting patient from ADLs, IADLs, leisure, and social participation.   COMORBIDITIES may have co-morbidities  that affects occupational performance. Patient will benefit from skilled OT to address above impairments and improve overall function.  MODIFICATION OR ASSISTANCE TO COMPLETE EVALUATION: Maximum or significant modification of tasks or assist is necessary to complete an evaluation.  OT OCCUPATIONAL PROFILE AND HISTORY: Comprehensive assessment: Review of records and extensive additional review of physical, cognitive, psychosocial history related to current functional performance.  CLINICAL DECISION MAKING: High - multiple treatment options, significant modification of task necessary  REHAB POTENTIAL: Fair    EVALUATION COMPLEXITY: High    PLAN: OT FREQUENCY: 2 x's a week  OT DURATION:12 weeks  PLANNED INTERVENTIONS: self care/ADL training, therapeutic exercise, therapeutic activity, neuromuscular re-education, manual therapy, passive range of motion, patient/family education, and cognitive remediation/compensation RECOMMENDED OTHER SERVICES: PT  CONSULTED AND AGREED WITH PLAN OF CARE: Patient and family member/caregiver  PLAN FOR NEXT SESSION: Review HEP   Richardson Otter, MS, OTR/L   This entire session was performed under direct supervision and direction of a licensed Estate agent. I have personally read, edited and approve of the note as written.  Damien Nap, OTS  10/18/2023

## 2023-11-01 ENCOUNTER — Ambulatory Visit: Admitting: Occupational Therapy

## 2023-11-01 ENCOUNTER — Ambulatory Visit

## 2023-11-06 ENCOUNTER — Ambulatory Visit: Attending: Physician Assistant | Admitting: Occupational Therapy

## 2023-11-06 ENCOUNTER — Ambulatory Visit

## 2023-11-06 DIAGNOSIS — H543 Unqualified visual loss, both eyes: Secondary | ICD-10-CM | POA: Insufficient documentation

## 2023-11-06 DIAGNOSIS — R269 Unspecified abnormalities of gait and mobility: Secondary | ICD-10-CM | POA: Diagnosis not present

## 2023-11-06 DIAGNOSIS — R2689 Other abnormalities of gait and mobility: Secondary | ICD-10-CM

## 2023-11-06 DIAGNOSIS — I6930 Unspecified sequelae of cerebral infarction: Secondary | ICD-10-CM

## 2023-11-06 DIAGNOSIS — R262 Difficulty in walking, not elsewhere classified: Secondary | ICD-10-CM | POA: Insufficient documentation

## 2023-11-06 DIAGNOSIS — R2681 Unsteadiness on feet: Secondary | ICD-10-CM | POA: Insufficient documentation

## 2023-11-06 DIAGNOSIS — R278 Other lack of coordination: Secondary | ICD-10-CM | POA: Diagnosis not present

## 2023-11-06 DIAGNOSIS — M6281 Muscle weakness (generalized): Secondary | ICD-10-CM

## 2023-11-07 NOTE — Therapy (Signed)
 OUTPATIENT PHYSICAL THERAPY TREATMENT   Patient Name: Paul Hall MRN: [id number] DOB:12/31/1995, 28 y.o., male Today's Date: 11/07/2023  PCP:  [facility] PROVIDER:  Morene Sous, PA-C   PT End of Session - 11/06/23 1540     Visit Number 171    Number of Visits 184   date corrected from most recent recert   Date for PT Re-Evaluation 11/27/23    Authorization Type [employer] [place] [place] Medicaid    Authorization Time Period 01/04/21-03/29/21; Recert 03/24/2021-06/16/2021; Recert 09/15/2021- 12/08/2021; Recert 12/13/2021-03/07/2022    Progress Note Due on Visit 180    PT Start Time 1534    PT Stop Time 1614    PT Time Calculation (min) 40 min    Equipment Utilized During Treatment Gait belt    Activity Tolerance Patient tolerated treatment well    Behavior During Therapy WFL for tasks assessed/performed                              Past Medical History:  Diagnosis Date   Diabetes mellitus (HCC)    Hypertension    Stroke (HCC)    Past Surgical History:  Procedure Laterality Date   IVC FILTER PLACEMENT ([facility] HX)     LEG SURGERY     PEG TUBE PLACEMENT     TRACHEOSTOMY     Patient Active Problem List   Diagnosis Date Noted   Sepsis due to vancomycin  resistant Enterococcus species (HCC) 06/06/2019   SIRS (systemic inflammatory response syndrome) (HCC) 06/05/2019   Acute lower UTI 06/05/2019   VRE (vancomycin -resistant Enterococci) infection 06/05/2019   Anemia 06/05/2019   Skin ulcer of sacrum with necrosis of muscle (HCC)    Urinary retention    Type 2 diabetes mellitus without complication, with long-term current use of insulin  (HCC)    Tachycardia    Lower extremity edema    Acute metabolic encephalopathy    Obstructive sleep apnea    Morbid obesity with BMI of 60.0-69.9, adult (HCC)    Goals of care, counseling/discussion    Palliative care encounter    Sepsis (HCC) 04/27/2019   H/O insulin  dependent diabetes mellitus 04/27/2019   History of  CVA with residual deficit 04/27/2019   Seizure disorder (HCC) 04/27/2019   Decubitus ulcer of sacral region, stage 4 (HCC) 04/27/2019    REFERRING DIAG: Cerebral infarction, unspecified   THERAPY DIAG:  Muscle weakness (generalized)  Difficulty in walking, not elsewhere classified  Other lack of coordination  Unsteadiness on feet  History of CVA with residual deficit  Other abnormalities of gait and mobility  Abnormality of gait and mobility  Low vision, both eyes  Rationale for Evaluation and Treatment Rehabilitation  PERTINENT HISTORY: Paul Hall is a 26yoM who presents with severe weakness, quadriparesis, altered sensorium, and visual impairment s/p critical illness and prolonged hospitalization. Pt hospitalized in October 2020 with ARDS 2/2 COVID19 infection. Pt sustained a complex and lengthy hospitalization which included tracheostomy, prolonged sedation, ECMO. In this period pt sustained CVA and SDH. Pt has now been liberated from tracheostomy and G-tube. Pt has since been hospitalized for wound infection and UTI. Pt lives with parents at home, has hospital bed and left chair, hoyer lift transfers, and power WC for mobility needs. Pt needs heavy physical assistance with ADL 2/2 BUE contractures and motor dysfunction   PRECAUTIONS: Fall  SUBJECTIVE:  Patient reports feeling off a bit today- was rushed to get here which threw him off  mentally. Reports increased soreness after Nustep - But was expecting it after my workout.  PAIN:  Are you having pain? No. But feeling tight.    TODAY'S TREATMENT:  - 11/06/2023    TE:  Passive stretching to bilateral ankles, knee, hips with most tightness on R side yet much more relaxed with B hamstring stretch today vs. Past visits.   Ankle DF/PF 2 x 10 ea LE Knee ext/FLEX 2 x 10 ea LE Manual resistive hip add hold 3 sec 2 x 10  Manual resistive hip abd 2 x 10  LAQ with eccentric control x 5 each LE AAROM seated hip march (2 x  10 reps)             PATIENT EDUCATION: Education details: Exercise technique with VC   HOME EXERCISE PROGRAM:  Access Code: [email or web address] URL: [email or web address] Date: 03/23/2022 Prepared by: Reyes London  Exercises - Supine Bridge  - 3 x weekly - 3 sets - 10 reps - 2 hold - Supine Gluteal Sets  - 3 x weekly - 3 sets - 10 reps - 5 sec hold - Supine Quad Set  - 1 x daily - 3 x weekly - 3 sets - 10 reps - 5 hold - Seated Long Arc Quad  - 1 x daily - 7 x weekly - 3 sets - 10 reps - Seated Hip Adduction Squeeze with Ball  - 1 x daily - 3 x weekly - 3 sets - 10 reps - 5 hold - Seated Hip Abduction  - 1 x daily - 3 x weekly - 3 sets - 10 reps - 2 hold    PT Short Term Goals -       PT SHORT TERM GOAL #1   Title Pt will be independent with HEP in order to improve strength and balance in order to decrease fall risk and improve function at home and work.    Baseline 01/04/2021= No formal HEP in place; 12/12 no HEP in place; 05/10/2021-Patient and his father were able to report compliance with curent HEP consisting of mostly seated/reclined LE strengthening. Both verbalize no questions at this time.    Time 6    Period Weeks    Status Achieved    Target Date 02/15/21            PT LONG TERM GOAL #1     Title Patient will increase BLE gross strength by 1/2 muscle grade to improve functional strength for improved independence with potential gait, increased standing tolerance and increased ADL ability.     Baseline 01/04/2021- Patient presents with 1/5 to 3-/5 B LE strength with MMT; 12/12: goal partially met for Left knee/hip; 05/10/2021= 2-/5 bilateal Hip flex; 3+/5 bilateral Knee ext; 06/21/2021= Patient presents with 2-/5 bilateral Hip flex; 3+/5 bilateral knee ext/flex; 2-/5 left ankle DF; 0/5 right ankle- and able to increase reps and resistance with LE's. 09/15/2021- Patient technically presents with 2-/5 B hip flex/abd/add - but he is able to raise his hip up to  approx 100 deg which has improved. 3+/5 Bilateral knee ext, 2-/5 left ankle and 0/5 right ankle.  12/08/2021= Patient able to lift left knee at 110 deg of hip flex; presents with 3+/5 knee ext, 2-/5 left ankle DF and 0/5 right ankle DF, 2-/5 bilateral Hip abd in seated position.    12/6: R: knee 3+/5 ext, 2/5 flexion, left knee 3+/5 extension, 3+/5 flexion, R hip: 2+/5 hip add, 2+/5 hip ABD L hip: 4-/5 hip ABD, 3+/5  hip ADD, 3+/5 hip flexion; 06/06/2022= Patient now presents with 2-/5 right ankle DF/PF;     Time 12     Period Weeks     Status MET    Target Date 03/07/2022         PT LONG TERM GOAL #2    Title Patient will tolerate sitting unsupported demonstrating erect sitting posture for 15 minutes with CGA to demonstrate improved back extensor strength and improved sitting tolerance.     Baseline 01/04/2021- Patient confied to sitting in lift chair or electric power chair with back support and unable to sit upright without physical assistance; 12/12: tolerates <1 minutes upright unsupported sitting. 05/10/2021=static sit with forward trunk lean  in his power wheelchair without back support x approx 3 min. 06/21/2021=Unable to assess today due to patient with acute back pain but on previous visit able to sit x 8 min without back support. 09/15/2021- on last visit- 09/13/2021- patient was able to sit unsupported x 8 min at edge of mat. 10/13/2023 - Patient was able to sit at edge of mat with varying level of assist today from SBA to min A for a total of 20 min. 12/13/2021= Patient demonstrated unsupported sitting at edge of mat for approx 20 min    Time 12     Period Weeks     Status GOAL MET    Target Date 12/08/21          PT LONG TERM GOAL #3    Title Patient will demonstrate ability to perform static standing in // bars > 2 min with Max Assist  without loss of balance and fair posture for improved overall strength for pre-gait and transfer activities.     Baseline 01/04/2021= Patient current uanble to  stand- Dependent on hoyer or sit to stand lift for transfers. 05/10/2021=Not appropriate yet- Currently still dependent with all transfers using hoyer. 06/21/2021= Patient continuing now to focus on LE strengthening to prepare for standing-unable to try today due to acute low back pain-  planning on attempting in new cert period. 09/15/2021- Patient has attempted standing 2x in past two week- max Assist of 2 people - only once was he successful to clearing his bottom from chair - Will continue to be a focus during the new certification. 12/13/2021= Patient has been limited secondary to increased overall low back pain during this certification and will require more time to focus on this goal.  12/6: not assessed this date, will assess at date when 2-3 PTs are present for assistance     Time 12     Period Weeks     Status GOAL not appropriate at this time - may attempt in future once Patient presents with improved overall LE strength.                 PT LONG TERM GOAL #4    Title Pt will improve FOTO score by 10 points or more demonstrating improved perceived functional ability     Baseline FOTO 7 on 10/17; 03/15/21: FOTO 12; 05/10/2021 06/21/2021= 1; 09/15/2021= 9; 12/13/2021= Will issue next visit 12/6: 4; 06/06/2022= Will assess next visit; Goal not appropriate as patient is not ambulatory.     Time 12     Period Weeks     Status WITHDRAWN    Target date 11/30/2022         PT LONG TERM GOAL #5    Title Patient will perform sit to stand transfer with appropriate AD and max  assist of 2 people with 75% consistency to prepare for pregait activities.     Baseline 09/15/2021= Patient unable to stand well- unable to clear his bottom off chair with Max assist of 2 persons. 12/13/2021- Goal not appropriate to try yet but will keep and roll over to next cert as shift continues to focus on transfers/standing; 4/24= Patient able to perform active ankle DF/PF with right LE and able to raise his knee into seated march and  clear floor without physical assist today - as previously unable as well as lift right knee ext to near full ROM to improve strength for eventual standing. 09/07/2022- Goal continues to not be appropriate but patient is now standing some and will keep goal active; 11/23/2022= patient is now performing sit to stand with max A +2 and now able to clear his bottom off mat. 11/30/2022- Patient continues to perform sit to stand transfers with Max A+2 and able to clear bottom well off mat but not erect yet. 05/31/2023- Patient is able to stand consistently with max A +2 - unable to achieve full knee or trunk ext yet is able to clear bottom off mat for improved LE weightbearing. 09/04/2023- Patient performed several Sit to stand requiring max A+2  with improving posture- able to ext R knee better - near terminal knee and able to clear bottom off mat- still not full but able to contract glutes for standing as erect as possible.     Time 12     Period Weeks     Status PROGRESSING    Target date  11/27/2023       PT LONG TERM GOAL #6  Title Patient will tolerate sitting unsupported demonstrating erect sitting posture for 30 minutes with CGA to demonstrate improved back extensor strength and improved sitting tolerance.   Baseline 12/13/2021= Patient demonstrated unsupported sitting at edge of mat for approx 20 min;  12/29/2021- Patient performed approx 30 min of dynamic sitting activities today. 06/06/2022= Patient demonstrated ability to sit and perform static and dynamic UE/LE movement with only Supervision.   Time 12   Period Weeks   Status GOAL MET           7.  Patient will tolerate 2 minutes or more of standing in sit to stand lift or with max assist +2 in order to indicate improved lower extremity weightbearing tolerance for progression to standing in parallel bars. Baseline: 1 minute on most recent stand 02/21/22; 06/06/2022- Patient did attempt today after complaining of right LE pain last week. He attempted 3  stands using sit to stand lift. - all over 1 min- last one approx 48 sec- stopped due to fatigue. More erect standing in lift today - still poor gluteal strength but able to activate glutes and extend hips upon command but unable to hold > 5 sec. 07/28/2022- Will attempt standing next visit but patient has been able to stand since last progress note for up to 3 min at a time at his best. 09/07/2022-  attempted standing with max +2 and patient able to stand 15-20 sec so will now  revise goal; 11/23/2022- Patient at his best between last 2 visits was able to stand approx 50 sec with max A+2.  11/30/2022- Patient performed 4 trials of static standing - max A +2- clearing bottom and using forearms to bear weight  03/15/2023: Patient performed 2 trials of static standing - max A +2- clearing edge of mat and using forearms to bear weight for  approximately 45 seconds, is able to come into approximately 45 degrees of knee flexion from sit to stand; 05/31/2023- Patient has not attempted standing in 1 month but able to perform x 4 trials with max A +2 (PT and his mom) and able to clear his bottom off mat and attempted to activate his glutes and apply some forearm pressure - able to stand between 25-40 sec today. 09/04/2023- Patient performed 3 trials of standing ranging from 17 -47 sec - improving right knee terminal knee ext and more activation upon command with gluteals- able to clear mat table. Continued to require Max A+2 and blocking knees with forearm/trunk support.   Goal status: PROGRESSING Target date: 11/27/2023   8. Patient will tolerate sitting unsupported demonstrating ability to perform dynamic UE/LE activities for 30 minutes  independently to demonstrate ability to sit at edge of bed to eat or perform some ADL's/exercise for optimal quality of life.             Baseline: 06/06/2022- Patient able to sit and perform some UE/LE exercises but requires CGA at times for safety- he is able to static sit for 30 min  with supervision. 07/27/2022= Patient able to now sit > 30 min with Supervision only - performing static and dynamic activities. Will keep goal active to ensure patient is consistent. 09/07/2022=  Patient able to demonstrate consistent ability to sit at edge of mat during visits             Goal Status: MET             Target date: 08/29/2022  9.  Patient will perform rolling left to right with Mod assist for improved independence with bed mobility to assist with dressing and positioning in bed. Baseline: 09/04/2023- Mother reported last visit that he was having some difficulty with assisting with bed mobility. Goal status: NEW Target date: 11/27/2023    10. Patient will perform active bridging -able to clear bottom   Off mat for 10 reps with PT/caregiver holding his feet    For improved trunk ext strength with standing and to    To assist with be mobility/positioning.  Baseline: 09/04/2023= (PT assisted- holding right LE In neutral and pressure on each foot) - Difficulty clearing bottom off mat. Goal status: NEW Target date: 11/27/2023           Plan     Clinical Impression Statement Patient continues to work on his strength despite some anxiety he endorsed during visit. He was able to relax better during session and demonstrating more power in right LE with improving eccentric control as he continues to develop his strength for upcoming pre-gait activities. Pt will continue to benefit from skilled physical therapy intervention to address impairments, improve QOL, and attain therapy goals.    Personal Factors and Comorbidities Comorbidity 3+;Time since onset of injury/illness/exacerbation    Comorbidities CVA, diabetes, Seizures    Examination-Activity Limitations Bathing;Bed Mobility;Bend;Caring for Others;Carry;Dressing;Hygiene/Grooming;Lift;Locomotion Level;Reach Overhead;Self Feeding;Sit;Squat;Stairs;Stand;Transfers;Toileting    Examination-Participation Restrictions Cleaning;Community  Activity;Driving;Laundry;Medication Management;Meal Prep;Occupation;Personal Finances;Shop;Yard Work;Volunteer    Stability/Clinical Decision Making Evolving/Moderate complexity    Rehab Potential Fair    PT Frequency 1-2x / week    PT Duration 12 weeks    PT Treatment/Interventions ADLs/Self Care Home Management;Cryotherapy;Electrical Stimulation;Moist Heat;Ultrasound;DME Instruction;Gait training;Stair training;Functional mobility training;Therapeutic exercise;Balance training;Patient/family education;Orthotic Fit/Training;Neuromuscular re-education;Wheelchair mobility training;Manual techniques;Passive range of motion;Dry needling;Energy conservation;Taping;Visual/perceptual remediation/compensation;Joint Manipulations    PT Next Visit Plan core strength/motor control in short-sitting, sit to stand if able; Continue with  progressive LE Strengthening. Stand activities as appropriate.  Nustep and potential standing in // bars as appropriate.    PT Home Exercise Plan No changes to HEP today    Consulted and Agree with Plan of Care Patient;Family member/caregiver    Family Member Consulted             03/09/2023, 8:04 AM      1:53 PM, 11/07/23 Reyes LOISE London PT Physical Therapist - [facility]  1:53 PM 11/07/23

## 2023-11-07 NOTE — Therapy (Addendum)
 . Occupational Therapy Treatment Note    Patient Name: Paul Hall MRN: [id number] DOB:19-Aug-1995, 28 y.o., male Today's Date: 11/07/2023  PCP: Dr. Bertell REFERRING PROVIDER: Dr. Bertell   .   OT End of Session - 11/07/23 0829     Visit Number [id number]    Number of Visits 180    Date for OT Re-Evaluation 01/16/24    OT Start Time 1620    OT Stop Time 1700    OT Time Calculation (min) 40 min    Equipment Utilized During Treatment tilt in space power wc    Activity Tolerance Patient tolerated treatment well    Behavior During Therapy WFL for tasks assessed/performed                 Past Medical History:  Diagnosis Date   Diabetes mellitus (HCC)    Hypertension    Stroke [facility])    Past Surgical History:  Procedure Laterality Date   IVC FILTER PLACEMENT ([facility] HX)     LEG SURGERY     PEG TUBE PLACEMENT     TRACHEOSTOMY     Patient Active Problem List   Diagnosis Date Noted   Sepsis due to vancomycin  resistant Enterococcus species (HCC) 06/06/2019   SIRS (systemic inflammatory response syndrome) (HCC) 06/05/2019   Acute lower UTI 06/05/2019   VRE (vancomycin -resistant Enterococci) infection 06/05/2019   Anemia 06/05/2019   Skin ulcer of sacrum with necrosis of muscle (HCC)    Urinary retention    Type 2 diabetes mellitus without complication, with long-term current use of insulin  (HCC)    Tachycardia    Lower extremity edema    Acute metabolic encephalopathy    Obstructive sleep apnea    Morbid obesity with BMI of 60.0-69.9, adult (HCC)    Goals of care, counseling/discussion    Palliative care encounter    Sepsis (HCC) 04/27/2019   H/O insulin  dependent diabetes mellitus 04/27/2019   History of CVA with residual deficit 04/27/2019   Seizure disorder (HCC) 04/27/2019   Decubitus ulcer of sacral region, stage 4 (HCC) 04/27/2019   ONSET DATE: 01/2019  REFERRING DIAG: CVA/COVID-19  THERAPY DIAG:  Muscle weakness (generalized)  Rationale for Evaluation  and Treatment Rehabilitation  SUBJECTIVE:  Pt.Caregiver reports that pt. has been working on Development worker, international aid stems to assist with making a floral wreath.  SUBJECTIVE STATEMENT:  Pt.  reports doing well today  Pt accompanied by: self and family member  PERTINENT HISTORY:  Pt. is a 28 y.o. male who was diagnosed with COVID-19, and CVA with resultant quadriplegia. Pt. Was then hospitalized with VRE UTI. PMHx includes: urinary retention, seizure disorder, obstructive sleep disorder, DM Type II, Morbid obesity.   PRECAUTIONS: None  WEIGHT BEARING RESTRICTIONS No  PAIN:  No reports of pain Are you having pain? 1-2/10 pain in L shoulder  FALLS: Has patient fallen in last 6 months? No  LIVING ENVIRONMENT: Lives with: lives with their family Lives in: House/apartment Stairs: No Level Entry Has following equipment at home: Wheelchair (power) and hoyer lift, sit to stand lift  PLOF: Independent  PATIENT GOALS: To be able to engage in more daily care tasks.  OBJECTIVE:   HAND DOMINANCE: Right  ADLs:  Transfers/ambulation related to ADLs: Eating: Pt. reports being able to hold standard utensils, and is starting to engage more in self-feeding tasks, hand to mouth patterns. Pt. Reports that he does as much as he can with the task, and family assists  with the remainder of the  task. Grooming: Pt. is able to initiate holding an electric toothbrush, and brush his teeth. Family assists LB Dressing: Total Assist UE dressing: Pt. is now able to reach up to actively assist with grasping , and pulling his gown down. Toileting:  Total Assist Bathing: MaxA UB, Total assist LB Tub Shower transfers: N/A Equipment: See above    IADLs: Shopping: Relies on family to assist Light housekeeping: Total Assist Meal Prep: Total Assist Community mobility:   Medication management:  Total Assist  Financial management: N/A Handwriting: Not legible: Pt. Is able to hold a pen with the left hand, and  initiate marking the page. Pt.'s eye glasses were not available  MOBILITY STATUS:  Power w/c  POSTURE COMMENTS:  Pt. Requires position changes in his power w/c  ACTIVITY TOLERANCE: Activity tolerance:  Fair  FUNCTIONAL OUTCOME MEASURES: FOTO: 40 TR score: 45  05/25/2022:   FOTO: 50 TR score: 45  07/20/22: FOTO 55  03/08/23: 51  UPPER EXTREMITY ROM     Active ROM Left eval Left  03/21/2022 Left 05/25/2022 L  07/20/22 Left 07/25/2022 Left  08/15/2022 Left  09/26/2022 Left  11/16/2022 Left 12/28/2022 Left 03/13/23 Left 05/10/23 Left 10/23/23  Shoulder flexion 119 123 125 130  136 138 130 142 144 144 148  Shoulder abduction 110 115 115 115  118 121 125 142 140 140 140  Shoulder adduction              Shoulder extension              Shoulder internal rotation              Shoulder external rotation              Elbow flexion 135(135) 145 145 145  145    145 145   Elbow extension -27(-18) -21(-20) -20(-16)  -25(-10) -23(-10) -21(-10) -20(-18) -20(-18) -20(-17) -17(-16) -12(-8)  Wrist flexion              Wrist extension 10(50) 19(50) 28(50)  30(50) 35(55) 36(55) 36(60) 40(60) 45(60) 45(60) 42(54)  Wrist ulnar deviation              Wrist radial deviation              Wrist pronation            60(74)  Wrist supination   Limited by flexor tone       10(15)  30(38)  (Blank rows = not tested)    Active ROM Right eval Right 03/21/2022 Right 05/25/2022 R  07/20/22 Right  07/25/22 Right 08/15/2022 Right 09/26/2022 Right 11/16/2022 Right 12/28/2022 Right 03/13/23 Right 05/10/23 Right 10/23/23  Shoulder flexion 106 scaption 105  Scaption 62 flexion 110 scaption 100  105 105 105 112 Scaption 123 Scaption 122  Scaption 142 Scaption  Shoulder abduction 114 90 97 100  105 117 120 124 124 122 144  Shoulder adduction              Shoulder extension              Shoulder internal rotation              Shoulder external rotation              Elbow flexion 120(130) 145 145 140  144  140 142 148 148 145   Elbow extension -45(-35) -22(-35) -28(-22)  -32(-21) -32(-28) -28(-26) -28(-28) -27(-26) -27(-40) -38(-35) -34(-26)  Wrist flexion  Wrist extension -30(10) -25(10) -10(30)  -10(20) -10(25) -20(40) -30(10) -22(34) -22(35) -32(22) -30(-8)  Wrist ulnar deviation              Wrist radial deviation              Wrist pronation              Wrist supination   Limited by flexor tone                12/28/2022: Thumb radial abduction: Right: 12(46), Left: 40(54)  Digits: Bilateral 2nd through 5th digit: MP Hyperextension with PIP, an DIP flexion    10/23/23:  Bilateral 2nd through 5th digit MP hyperextension with PIP, and DIP flexion: Pt. has been intermittently able to initate active diigt PIP, and DIP extension following Botox during this past recertification/progress reporting period.     UPPER EXTREMITY MMT:     Left eval Left 03/21/2022 Left  05/25/2022 L 07/20/22 Left  08/15/2022 Left 09/26/2022 Left 11/16/2022 Left 12/28/2022 Left  03/13/2023 Left  05/10/23 Left 10/23/23  Shoulder flexion 3-/5 3/5 3+/5 4+/5 4+/5 4+/5 4+/5 4+/5 4+/5 4+/5 4+/5  Shoulder abduction 3-/5 3-/5 4-/5 4+/5 4+/5 4+5 4+/5 4+/5 4+/5 4+/5 4+/5  Shoulder adduction             Shoulder extension             Shoulder internal rotation             Shoulder external rotation             Elbow flexion 3/5 3+/5 4-/5 4+/5 5/5 5/5 5/5 5/5 5/5 5/5 5/5  Elbow extension 2-/5 2/5  4+/5 4+/5 4+/5  4/5 4/5 4/5 through available range 4+/5 through available range  Wrist flexion             Wrist extension 2/5 2+/5 3-/5  3-/5 3-/5 3-/5 3/5 3/5 3/5 3+/5  Wrist ulnar deviation             Wrist radial deviation             Wrist pronation             Wrist supination             (Blank rows = not tested)  Right eval Right 03/21/2022 Right  05/25/2022 R 07/20/22 Right 08/15/2022 Right 09/26/2022 Right 11/16/2022 Right 12/28/2022 Right 03/13/2023 Right 05/10/23 Right 10/23/23  Shoulder flexion  3-/5 scaption 3-/5 3-/5 3+/5 3+/5 3+/5 3/5 3/5 3/5 3+/5 4-/5  Shoulder abduction 3-/5 3-/5 3-/5 4/5 4/5 4/5 4/5 4/5 4/5 4/-5 4-/5  Shoulder adduction             Shoulder extension             Shoulder internal rotation             Shoulder external rotation             Elbow flexion 3/5 3/5 3+/5 4/5 4+/5 4+/5 4+/5 4+/5 4+/5 4+/5 4+/5  Elbow extension 2-/5 2/5 3-/5 4+/5 4+/5 4+/5 4+/5 4-/5 4-/5 4-/5 4-/5 through available range  Wrist flexion             Wrist extension 2-/5 2-/5 2/5 2/5 2/5 2/5 2/5 2/5 2/5 2/5 2/5  Wrist ulnar deviation             Wrist radial deviation             Wrist pronation  Wrist supination               HAND FUNCTION:  Grip strength: Right: 0 lbs; Left: 0 lbs and Lateral pinch: Right: 5 lbs, Left: 2 lbs  03/21/2022: Lateral pinch: Right: 3.5 lbs, Left: 2 lbs  07/20/22: Grip strength: Right: 3 lbs; Left: 4 lbs and Lateral pinch: Right: 5 lbs, Left: 3 lbs  08/15/2022:  Grip strength: Right: 0 lbs; Left: 5 lbs and Lateral pinch: Right: 2 lbs, Left: 4 lbs  09/26/2022  Grip strength: Right: 0 lbs; Left: 8 lbs and Lateral pinch: Right: 2 lbs, Left: 5 lbs  11/16/2022  Grip strength: Right: 0 lbs; Left: 8 lbs  9/25/20204  Grip strength: Right: 0 lbs; Left: 8 lbs  Grip strength: Right: 0 lbs; Left: 8 lbs   03/08/23:  Grip strength: Right: 0 lbs without PROM, 5 lbs after PROM, Left 8 lbs  05/10/23:  Grip strength: TBD  Bilateral digit PIP/DIP flexion contractures with MP hyperextension with attempts for AROM. Pt. is able to tolerate AROM to the bilateral digits at the initial evaluation however, has a history of pain in the digits.  COORDINATION: Eval: Pt. is unable to grasp 9-hole test pegs. Pt. is able to initiate grasping larger pegs, and is able to hold a pen in the left hand.  07/20/22: 2 min 36 seconds to remove 9 pegs from 9 hole peg test - cues to locate pegs 2/2 low vision. Pt. is able to initiate grasping larger pegs on R hand  and is able to hold a pen in the left hand.  08/15/2022:    Vision, and sensation limiting accuracy of 9 hole peg test results. Pt. Was able to grasp and remove vertical pegs intermittently with cues.    05/10/23: TBD  SENSATION: Light touch: Impaired   EDEMA:  N/A  MUSCLE TONE: BUE flexor Spasticity  COGNITION Overall cognitive status: Continue to assess in functional context  VISION:   Subjective report: Pt. was not wearing glasses at the time of the initial eval.  Baseline vision: Vision is very limited. Wears glasses all the time Visual history: History of impaired vision following CVA. Pt. Has received treatment through the [facility] low vision rehabilitation program.   VISION ASSESSMENT: Impaired To be further assessed in functional context  PERCEPTION: Impaired   PRAXIS: Impaired: motor planning  OBSERVATIONS:  Pt reports being on Tramadol   TODAY'S TREATMENT 10/30/23   Therapeutic Ex.:  -BUE PROM was performed with slow prolonged gentle stretching of bilateral hands including wrist/digit flexion/extension including Mps, PIPs, DIPs of all digits, and wrist flexion/extension, and forearm supination   Self-Care:  -Pt. Simulated self feeding task, using RUE to alternate grasping onto standard/adaptive utensils to grasping onto various sized circular foam discs from sectional plate.  -Facilitated hand to mouth patterns with a standard spoon using then RUE. -Pt. Grasped  various sized lightweight circular foam discs using the LUE, following with simulating dipping the discs into another section of the sectional plate, in preparation for hand to mouth patterns.      PATIENT EDUCATION: Education details: Incorporating Active Right hand supination during self-feeding tasks, grasping and releasing utensils, grasping snack foods from plate. Person educated: Patient and Parent Education method: Explanation Education comprehension: verbalized understanding  HOME EXERCISE  PROGRAM:    Incorporating  the right hand during self-feeding tasks.   GOALS: Goals reviewed with patient? Yes  SHORT TERM GOALS: Target date: 12/05/2023   To assess splint fit, and make appropriate adjustments  to promote good skin integrity through the palmar surface of the bilateral hands.  Baseline: 05/25/22: Goal currently met, however ongoing as needs to assess splint fit arise. 03/23/2022: Pt. is wearing splints a couple of hours at night bilateral resting hand splints. 03/21/2022: Pt. is wearing splints a couple of hours at night bilateral resting hand splints. Goal status: Deferred   LONG TERM GOALS: Target date: 01/15/2024   FOTO score will Improve by 2 points for Pt. perceived improvement with the assessment specific ADL/IADL tasks.  Baseline: 03/08/23: 51; 12/28/2022: FOTO score: 56; 11/16/2022: 52 09/27/2022: 51 07/20/2022: FOTO 55 05/25/2022: FOTO score: 50,  TR score: 45 Eval: FOTO score: 40, TR score: 45 Goal status: Achieved   2.   Pt. will independently perform oral care for 100% of the task after complete set-up. Baseline: 05/10/23: Pt. Performs 90% of the task. Pt.'s mother assists with 10% of the task for thorough cleaning the hard to reach places way in the back of the oral cavity.03/08/23: not as much assist required proximally, but to adjust positioning of toothbrush; 02/13/23: Pt. Continues to require assist proximally 01/09/2023: Continue 12/28/2022:  Pt. Continues to complete 90% of the task with complete set-up, with visual cues, and occassional assist of the right elbow. 11/16/2022: Pt. Completes 90% of the task with complete set-up. 09/26/2022: Completes 90% of the task, proximal assist at times required at the elbow.  08/15/2022: Completes 90% of the task, proximal assist at times required at the elbow. 07/20/22: completes 90% of task, limited by shoulder flexion. 05/25/2022:  Pt. Is able to initiate and perform oral care for approximately 90% of the task. Complete set-up required.  Assi needed only for the very back teeth. 03/23/2022: Pt. Is able to initiate and perform oral care for approximately 75% of the task. Pt. Requires assist at proximally at the elbow for through oral care. 03/21/2022: Pt. Is able to initiate and perform oral care for approximately 75% of the task. Pt. Requires assist at proximally at the elbow for through oral care. Eval: Pt. is able to initiate using an electric toothbrush. Pt. requires assist for set-up, and assist for thoroughness, and as he Pt. fatigues. Goal status:  Partially met, Goal discontinued  3.  Pt. Will be modified independence with self-feeding for 100% using a swivel spoon, and standard fork after complete set-up Baseline: 05/10/2023: Pt. feeds self 95-100% of the meal. with occasional assist to reposition the utensil. 03/08/23: indep with cup with a handle and lid, assist to reposition eating utensils in hand;  02/13/23:  Pt. Is now able to consistently, and efficiently use a swivel spoon for eating cereal with the left hand.01/09/2023: Continue 12/28/2022: Pt. Is now feeding himself 90% of the time using a swivel spoon with the left hand, and 95% of the time with the fork. Pt. Continues to have difficulty with cutting food. 11/16/2022: 100% with finger foods, Pt. Is initiating with a swivel spoon, and universal cuff for fork use, however requires assist for consistency, and accuracy.  09/26/2022: 90% using the spoon, assist at time with the forearm motion, and grip on the utensils. 100% for finger foods.  08/15/2022:  90% using the spoon, assit at time with the forearm motion, and grip on the utensils. 100% for finger foods-independent with set-up- unsupervised.  07/20/22: self-feeds cereal using spoon 90% of task. 05/25/2022: Pt. Is able to use a spoon to scoop cereal when feeding himself cereal 85% of the time. Pt. Is able to feed himself snack/finger foods  100% of the time. Pt. Continues to work on consistency of  stabilizing a cup/mug when  drinking. Pt. Is able to grasp a water  bottle with assist initially, with assist tapering off as he drinks.03/23/2022: Pt. Is able to perform scooping cereal for 75% of the time. Pt. required assist, and support at the left elbow, and Pt. Presents with limited forearm supination when using the spoon, and bringing it towards his mouth. Pt. Is able to use a fork to spear items, and perform the hand to mouth pattern.  03/21/2022: Pt. Is able to perform scooping cereal for 75% of the time. Pt. required assist, and support at the left elbow, and Pt. presents with limited forearm supination when using the spoon, and bringing it towards his mouth. Pt. Is able to use a fork to spear items, and perform the hand to mouth pattern.  Eval: Pt. is able to hold standard standard utensils. Pt. Performs as much of the task as he, can and has assistance for the remainder. Goal status:  Improved, Achieved  4.  Pt. will improve grasp patterns and consistently grasp 1/4 objects for ADL, and IADL tasks.  Baseline: 10/23/2023: Pt. is able to initiate grasping small objects visual cues, and increased tim, however continues to work towards grasping 1/4 objects efficiently. 08/27/23: Pt. is able to grasp flat discs, and sustain a secure hold on them while moving the UE through various planes. 08/07/23: Continue 05/31/2023: Continue 05/10/23: Pt. is consistently able to grasp 1/2 objects with visual cues. Pt. Requires assist, and increased visual cues to grasp 1/4 objects on an elevated plane.  Pt. Is unable to grasp the flat objects at the tabletop surface. Pt. Is grasping grasping small puppy vitamins.03/08/23: 1/2 objects efficiently; 02/13/2023: 1/2 objects efficiently 01/09/2023: Continue 12/28/2022: Continues to grasp 1/4 pegs with min a + visual cues, consistently grasping 1/2 objects with visual cues 11/16/2022: Continues to grasp 1/4 pegs with min a + visual cues, consistently grasping 1/2 objects with visual cues 09/26/2022:  Continues to grasp 1/4 pegs with min a + visual cues, consistently grasping 1/2 objects with visual cues. 08/15/2022: grasps 1/4 pegs with min a + visual cues, consistently grasping 1/2 objects with visual cues. 07/20/22: grasps 1/4 pegs with min a + visual cues, consistently grasping 1/2 objects with visual cues. 05/25/2022: Pt. Is working on improving consistency of grasping 1/2 objects with visual cues.  03/23/2022: Pt. Is able to grasp 1 objects consistently,and continues to work on the hand patterns needed to grasp 1/2 objects.03/21/2022: Pt. Is able to grasp 1 objects consistently,and continues to work on the hand patterns needed to grasp 1/2 objects. Eval: Pt. is able to grasp 1 objects intermittently using a lateral grasp pattern. Goal status: Ongoing  5.  Pt. will independently write his name legibly with letter sizes under 1. Baseline: 05/10/23: Deferred 03/08/23: Not addressed this period; 02/13/2023: Continue 01/09/2023: Continue. 12/28/2022: Pt. is now using an IPad Pen. 11/16/2022: Pt. Continues to be able to write name with smaller letter size. Pt. Is signing name with a computer styus. Pt. Requires visual cues, and assist 09/26/2022: Pt. Continues to be able to write name with smaller letter size. Pt. Is signing name with a computer styus. Pt. Requires visual cues, and assist. 08/15/2022: Pt. Is able to write name with smaller letter size. Pt. Is signing name with a computer styus. Pt. Requires visual cues, and assist. 07/20/22: stabilizing assist to write name with 75% legibility with 2 letters. 05/25/2022: Pt. to continue to  work towards formulating grasp patterns in preparation for grasping a large width pen. Pt. Requires visual cues. 03/23/2022: Pt. Is able to write his name with modA, however has difficulty with formulating letter sizes less than 2 in size with 50% legibility for the 3 letters of his name.03/21/2022: Pt. Is able to write his name with modA, however has difficulty with  formulating letter sizes less than 2 in size with 50% legibility for the 3 letters of his name. Eval: Pt is able to hold a thin marker with his left hand, and formulate a line, and initiate a circular pattern (Pt. without glasses today) Goal status: Deferred  6. Pt. Will reach up to comb/brush his hair with minA.  Baseline: 05/10/23: Pt. is able to use a pick independently with the left hand with occasional assist to the far side of the left his head. 03/08/23: Pt uses pick in L hand to reach 75% of his head; unable to reach R side of head; 02/13/2023:Pt. is able to use a hair pick with the left hand on the right side of his head, top, and back of the head. Pt. Continues to have difficulty reaching further to the right side of his head with the left. 01/09/2023: Continue 12/28/2022: Pt. Is progressing, and is now able to use a hair pick with the left hand on the right side of his head, top, and back of the head. Pt. Continues to have difficulty reaching further to the right side of his head with the left. 11/16/2022: Pt. Is able to use a hair pick for the left side of his head using his left hand. Pt. Presents with difficulty reaching to the right side of his head.09/26/2022: Pt. Is able to use a hair pick for the left side of his head using his left hand. Pt. Presents with difficulty reaching to the right side of his head. 5/13/202: 75% using a hair pick, assist to the right side of the head. 07/20/22: reaches 75% of head, assist for far R side of head, fatigues quickly. 05/25/2022: Pt. Is able to reach up with the left hand to the left side, top, and back of his head. 03/23/2022: Pt. is now able to more consistently initiate reaching up to his head with his left hand in preparation for haircare12/18/2023: Pt. is now able to more consistently initiate reaching up to his head with his left hand in preparation for haircare. Eval: Pt. is able to initiate reaching up for hair care with a long handled brush, however is  unable to sustain UE's in elevation to perform the task.     Goal status: Achieved  7. Pt. Will independently navigate the w/c through his environment with minA with visual scanning, and hand placement on the controls.  Baseline: 11/16/2022: Deferred 2/2 vision 09/26/2022: Pt. Is now navigating his w/c ouside the home, and around his block. 08/15/2022: Pt. Requires minA to setup hand on controls and MIN cues to navigate the w/c in wide spaces, requires MIN - MOD cues to navigate the w/c through more narrow doorways, and tighter turns - varies based on good vs bad vision days.07/20/22: Pt. Requires minA to setup hand on controls and MIN cues to navigate the w/c in wide spaces, requires MIN - MOD cues to navigate the w/c through more narrow doorways, and tighter turns - varies based on good vs bad vision days. 05/25/2022: Pt. Requires minA  and  cues to navigate the w/c in wide spaces, and requires Mod cues to  navigate the w/c through more narrow doorways, and tighter turns. Pt. Requires max cues for scanning through the environment, and moderate cues for hand placement on the controls.     Goal status: Deferred 2/2 vision    8. Pt. Will improve bilateral grip strength to be able to independently grasp, and pull up blankets, and linens.   Baseline: 05/10/23: Deferred 03/08/23: R grip 0-5 lbs, L grip 8 lbs; pt reports he can consistently pull up blankets and linens independently.  02/13/2023: Continue. Pt. presents with digit PIP, and DIP flexor tightness. 01/09/2023: Continue 12/28/2023: R: 0#, L: 5# Pt. Is now able to more consistently grasp and pull blankets up over him. 11/16/2022: R: 0 L: 8# Pt. Is more consistently grasping his blankets and attempting to pull them up. 09/26/2022: R: 0  L: 8# Pt. is attempting to grasp, and pull blankets up more, using mostly the left hand. 08/15/2022: Pt. has difficulty securely holding, and pulling up blankets, and linens.    Goal status:Deferred  9. Pt. will consistently  actively control the releasing of blankets, covers, and linens from his hands once they are in the desired position over him. Baseline:12/28/2022: Pt. Is consistently actively able to release linens once they are in the desired position over him. 11/16/2022: Pt. continues to improve with actively releasing blankets form his hands. 09/26/2022: Pt. is improving with actively releasing blankets form his hands. 08/15/2022: Pt. has difficulty with controlled releasing of blankets/linens from his grasp.     Goal status: Achieved    10. Pt. Will improve bilateral UE strength by 2 MM grades to assist with ADLs, and IADLs  Baseline: 10/23/2023: Right: shoulder flexion: 4-/5, abduction: 4-/5, elbow flexion: 4+/5 , extension: 4-/5 wrist extension: 2/5; Left: shoulder flexion: 4+/5, abduction: 4+/5, elbow flexion: 5/5 , extension: 4+/5  wrist extension: 3+/5  08/27/23:  Right: shoulder flexion: 3+/5, abduction: 4-/5, elbow flexion: 4+/5 , extension: 4-/5 wrist extension: 2/5; Left: shoulder flexion: 4+/5, abduction: 4+/5, elbow flexion: 5/5 , extension: 4/5  wrist extension: 3/5  08/07/23: Continue 05/31/2023: Continue 05/10/23: Right: shoulder flexion: 3+/5, abduction: 4-/5, elbow flexion: 4+/5 , extension: 4-/5 wrist extension: 2/5; Left: shoulder flexion: 4+/5, abduction: 4+/5, elbow flexion: 5/5 , extension: 4/5  wrist extension: 3/5 03/13/2023: Right: shoulder flexion: 3/5, abduction: 4/5, elbow flexion: 4+/5 , extension: 4-/5 wrist extension: 2/5; Left: shoulder flexion: 4+/5, abduction: 4+/5, elbow flexion: 5/5 , extension: 4/5  wrist extension: 3/5 03/08/23: NT this date d/t as pt arrived late; 02/13/2023: Continue 01/09/2023: Continue 12/28/2022: Right: shoulder flexion: 3/5, abduction: 4/5, elbow flexion: 4+/5 , extension: 4-/5 wrist extension: 2/5; Left: shoulder flexion: 4+/5, abduction: 4+/5, elbow flexion: 5/5 , extension: 4/5  wrist extension: 3/5 Right: shoulder flexion: 3+/5, abduction: 4/5, elbow flexion: 4+/5 ,  extension: 4+/5 wrist extension: 2/5; Left: shoulder flexion: 4+/5, abduction: 4+/5, elbow flexion: 5/5 , extension: 4+/5 wrist extension: 3-/5 11/16/2022: Right: shoulder flexion: 3/5, abduction: 4/5, elbow flexion: 4+/5 , extension: 4+/5 wrist extension: 2/5; Left: shoulder flexion: 4+/5, abduction: 4+/5, elbow flexion: 5/5 , extension:  wrist extension: 3-/5 Right: shoulder flexion: 3+/5, abduction: 4/5, elbow flexion: 4+/5 , extension: 4+/5 wrist extension: 2/5; Left: shoulder flexion: 4+/5, abduction: 4+/5, elbow flexion: 5/5 , extension: 4+/5 wrist extension: 3-/5 Goal status: Ongoing  11. Pt. will improve right wrist extension by 10 degrees to initiate reaching for items in preparation for ADLs. Baseline: 10/23/23: -30(-8) 08/23/2023: R Wrist Ext: -31(-8)  08/07/23: Continue 05/31/23: Continue 05/10/23: Right wrist extension: -32(22) 03/13/23: -22(35) 03/08/23: NT this  date as pt arrived late; 02/13/2023: Continue 01/09/2023: Continue 12/28/2022: -22(34) 11/16/2022: -30(10)    Goal status: Ongoing      12. Pt. Will require donn/doff a Tank top T-shirt efficiently with supervision and full set-up.    Baseline:10/23/23: Independent doff the shirt/tank, MaxA  donning. 10/23/23: Pt. Is able to independently doff his shirt, requiring assist to lean forward and loosen the back. 08/07/23: Continue 05/31/2023: Pt. Requires maxA donning a pullover sweatshirt, Mod-maxA doffing it. T-shirt tank TBD 05/10/23: Pt. Presents with difficulty completing, and requires increased time to complete    Goal status: Ongoing    13. Pt. Will independently perform bilateral/bimanual hand tasks needed to fold towels/linens/laundry Baseline: 10/23/23: Pt. Continues to have difficulty folding laundry, and linens. 08/23/23: Pt. Continues to have difficulty using bilateral/bimanual hand tasks needed to fold towels/linens/laundry. 08/07/23: Continue 05/31/23: Continue 05/10/23:  Pt. Has difficulty folding towels/lines/laundry using bilateral  hands Goal status:Ongoing  14. Pt. Will independently, and efficiently reach his right arm over the side of the chair to pet his dog.    Baseline: 08/23/2023: Pt. Is now able to reach over the side of the armrest of his recliner chair to pet his dog.  08/07/23: Continue 05/31/2023: Continue 05/10/23: Pt. is unable to efficiently reach his right UE over the side of his recliner chair to pet his dog.    Goal status: Achieved    15. Pt. Will independently, and efficiently reach out in front of him to efficiently place items onto his table while sitting in his recliner.     Baseline: 10/23/23: Pt. Is improving with functional reaching with the left UE out in front of him to place items onto the table. Pt. has difficulty reaching out with the right hand to  place items on the table. 08/23/2023: Pt. has difficulty consistently reaching out in front of him to place items on the on a tabletop surface with the right hand. 08/07/23: Continue 2/26/225: Continue 05/10/23: Pt. has difficulty reaching out in front of him far enough to efficiently place items on the table while sitting in a recliner chair.    Goal status: Ongoing     16. Pt. Will improve bilateral hand supination by 10 degrees to be able  actively turn items in his hand to look at them independently and efficiently during ADLs/IADLs,    Baseline: 10/23/23: Right: unable, L: supination 30(38)    Goal status: New      17. Pt. Will independently, and efficiently transition between picking up finger foods for dipping, and  picking up utensils , then placing them down on the tabletop surface through 75% of the meal with minimal cues.    Baseline: Pt.requires heavy cues, and assist to grasp finger foods, and utensils, and transition between the two.    Goal status: New   ASSESSMENT:  CLINICAL IMPRESSION:   Pt. Has been increasing engagement of the BUEs at home. Pt. Was able to initiate performing self-feeding skills, and hand to mouth patterns using the  right hand with hand with support, and assist throughout the movement. Pattern. Pt. Fatigued quickly today 2/2 to recent GI issues.  Pt. Required multiple rest breaks throughout the session today. Pt./caregivers look forward to starting the [employer] at home. Pt.'s caregivers continue to be very supportive to continue working towards improving BUE functioning, ROM, strength, motor control, [facility] skills, as well as visual compensatory strategies in order to maximize independence with daily ADLs, and IADL functioning.     PERFORMANCE DEFICITS in  functional skills including ADLs, IADLs, coordination, dexterity, proprioception, ROM, strength, pain, FMC, GMC, decreased knowledge of use of DME, and UE functional use, cognitive skills including safety awareness, and psychosocial skills including coping strategies, environmental adaptation, habits, and routines and behaviors.   IMPAIRMENTS are limiting patient from ADLs, IADLs, leisure, and social participation.   COMORBIDITIES may have co-morbidities  that affects occupational performance. Patient will benefit from skilled OT to address above impairments and improve overall function.  MODIFICATION OR ASSISTANCE TO COMPLETE EVALUATION: Maximum or significant modification of tasks or assist is necessary to complete an evaluation.  OT OCCUPATIONAL PROFILE AND HISTORY: Comprehensive assessment: Review of records and extensive additional review of physical, cognitive, psychosocial history related to current functional performance.  CLINICAL DECISION MAKING: High - multiple treatment options, significant modification of task necessary  REHAB POTENTIAL: Fair    EVALUATION COMPLEXITY: High    PLAN: OT FREQUENCY: 2 x's a week  OT DURATION:12 weeks  PLANNED INTERVENTIONS: self care/ADL training, therapeutic exercise, therapeutic activity, neuromuscular re-education, manual therapy, passive range of motion, patient/family education, and cognitive  remediation/compensation RECOMMENDED OTHER SERVICES: PT  CONSULTED AND AGREED WITH PLAN OF CARE: Patient and family member/caregiver  PLAN FOR NEXT SESSION: Review HEP   Richardson Otter, MS, OTR/L   11/07/23

## 2023-11-08 ENCOUNTER — Ambulatory Visit

## 2023-11-08 ENCOUNTER — Ambulatory Visit: Admitting: Occupational Therapy

## 2023-11-13 ENCOUNTER — Ambulatory Visit: Admitting: Occupational Therapy

## 2023-11-13 ENCOUNTER — Ambulatory Visit

## 2023-11-13 NOTE — Therapy (Incomplete)
 OUTPATIENT PHYSICAL THERAPY TREATMENT   Patient Name: Paul Hall MRN: [id number] DOB:January 06, 1996, 28 y.o., male Today's Date: 11/13/2023  PCP:  [facility] PROVIDER:  Morene Sous, PA-C                         Past Medical History:  Diagnosis Date   Diabetes mellitus (HCC)    Hypertension    Stroke [facility])    Past Surgical History:  Procedure Laterality Date   IVC FILTER PLACEMENT ([facility] HX)     LEG SURGERY     PEG TUBE PLACEMENT     TRACHEOSTOMY     Patient Active Problem List   Diagnosis Date Noted   Sepsis due to vancomycin  resistant Enterococcus species (HCC) 06/06/2019   SIRS (systemic inflammatory response syndrome) (HCC) 06/05/2019   Acute lower UTI 06/05/2019   VRE (vancomycin -resistant Enterococci) infection 06/05/2019   Anemia 06/05/2019   Skin ulcer of sacrum with necrosis of muscle (HCC)    Urinary retention    Type 2 diabetes mellitus without complication, with long-term current use of insulin  (HCC)    Tachycardia    Lower extremity edema    Acute metabolic encephalopathy    Obstructive sleep apnea    Morbid obesity with BMI of 60.0-69.9, adult (HCC)    Goals of care, counseling/discussion    Palliative care encounter    Sepsis (HCC) 04/27/2019   H/O insulin  dependent diabetes mellitus 04/27/2019   History of CVA with residual deficit 04/27/2019   Seizure disorder (HCC) 04/27/2019   Decubitus ulcer of sacral region, stage 4 (HCC) 04/27/2019    REFERRING DIAG: Cerebral infarction, unspecified   THERAPY DIAG:  No diagnosis found.  Rationale for Evaluation and Treatment Rehabilitation  PERTINENT HISTORY: Wilson Dusenbery is a 26yoM who presents with severe weakness, quadriparesis, altered sensorium, and visual impairment s/p critical illness and prolonged hospitalization. Pt hospitalized in October 2020 with ARDS 2/2 COVID19 infection. Pt sustained a complex and lengthy hospitalization which included tracheostomy, prolonged  sedation, ECMO. In this period pt sustained CVA and SDH. Pt has now been liberated from tracheostomy and G-tube. Pt has since been hospitalized for wound infection and UTI. Pt lives with parents at home, has hospital bed and left chair, hoyer lift transfers, and power WC for mobility needs. Pt needs heavy physical assistance with ADL 2/2 BUE contractures and motor dysfunction   PRECAUTIONS: Fall  SUBJECTIVE:  *** Patient reports feeling off a bit today- was rushed to get here which threw him off mentally. Reports increased soreness after Nustep - But was expecting it after my workout.  PAIN:  Are you having pain? No. But feeling tight.    TODAY'S TREATMENT:  - 11/06/2023    TE:  Passive stretching to bilateral ankles, knee, hips with most tightness on R side yet much more relaxed with B hamstring stretch today vs. Past visits.   Ankle DF/PF 2 x 10 ea LE Knee ext/FLEX 2 x 10 ea LE Manual resistive hip add hold 3 sec 2 x 10  Manual resistive hip abd 2 x 10  LAQ with eccentric control x 5 each LE AAROM seated hip march (2 x 10 reps)             PATIENT EDUCATION: Education details: Exercise technique with VC   HOME EXERCISE PROGRAM:  Access Code: [email or web address] URL: [email or web address] Date: 03/23/2022 Prepared by: Reyes London  Exercises - Supine Bridge  - 3 x weekly - 3 sets - 10 reps -  2 hold - Supine Gluteal Sets  - 3 x weekly - 3 sets - 10 reps - 5 sec hold - Supine Quad Set  - 1 x daily - 3 x weekly - 3 sets - 10 reps - 5 hold - Seated Long Arc Quad  - 1 x daily - 7 x weekly - 3 sets - 10 reps - Seated Hip Adduction Squeeze with Ball  - 1 x daily - 3 x weekly - 3 sets - 10 reps - 5 hold - Seated Hip Abduction  - 1 x daily - 3 x weekly - 3 sets - 10 reps - 2 hold    PT Short Term Goals -       PT SHORT TERM GOAL #1   Title Pt will be independent with HEP in order to improve strength and balance in order to decrease fall risk and improve  function at home and work.    Baseline 01/04/2021= No formal HEP in place; 12/12 no HEP in place; 05/10/2021-Patient and his father were able to report compliance with curent HEP consisting of mostly seated/reclined LE strengthening. Both verbalize no questions at this time.    Time 6    Period Weeks    Status Achieved    Target Date 02/15/21            PT LONG TERM GOAL #1     Title Patient will increase BLE gross strength by 1/2 muscle grade to improve functional strength for improved independence with potential gait, increased standing tolerance and increased ADL ability.     Baseline 01/04/2021- Patient presents with 1/5 to 3-/5 B LE strength with MMT; 12/12: goal partially met for Left knee/hip; 05/10/2021= 2-/5 bilateal Hip flex; 3+/5 bilateral Knee ext; 06/21/2021= Patient presents with 2-/5 bilateral Hip flex; 3+/5 bilateral knee ext/flex; 2-/5 left ankle DF; 0/5 right ankle- and able to increase reps and resistance with LE's. 09/15/2021- Patient technically presents with 2-/5 B hip flex/abd/add - but he is able to raise his hip up to approx 100 deg which has improved. 3+/5 Bilateral knee ext, 2-/5 left ankle and 0/5 right ankle.  12/08/2021= Patient able to lift left knee at 110 deg of hip flex; presents with 3+/5 knee ext, 2-/5 left ankle DF and 0/5 right ankle DF, 2-/5 bilateral Hip abd in seated position.    12/6: R: knee 3+/5 ext, 2/5 flexion, left knee 3+/5 extension, 3+/5 flexion, R hip: 2+/5 hip add, 2+/5 hip ABD L hip: 4-/5 hip ABD, 3+/5 hip ADD, 3+/5 hip flexion; 06/06/2022= Patient now presents with 2-/5 right ankle DF/PF;     Time 12     Period Weeks     Status MET    Target Date 03/07/2022         PT LONG TERM GOAL #2    Title Patient will tolerate sitting unsupported demonstrating erect sitting posture for 15 minutes with CGA to demonstrate improved back extensor strength and improved sitting tolerance.     Baseline 01/04/2021- Patient confied to sitting in lift chair or electric  power chair with back support and unable to sit upright without physical assistance; 12/12: tolerates <1 minutes upright unsupported sitting. 05/10/2021=static sit with forward trunk lean  in his power wheelchair without back support x approx 3 min. 06/21/2021=Unable to assess today due to patient with acute back pain but on previous visit able to sit x 8 min without back support. 09/15/2021- on last visit- 09/13/2021- patient was able to sit unsupported x  8 min at edge of mat. 10/13/2023 - Patient was able to sit at edge of mat with varying level of assist today from SBA to min A for a total of 20 min. 12/13/2021= Patient demonstrated unsupported sitting at edge of mat for approx 20 min    Time 12     Period Weeks     Status GOAL MET    Target Date 12/08/21          PT LONG TERM GOAL #3    Title Patient will demonstrate ability to perform static standing in // bars > 2 min with Max Assist  without loss of balance and fair posture for improved overall strength for pre-gait and transfer activities.     Baseline 01/04/2021= Patient current uanble to stand- Dependent on hoyer or sit to stand lift for transfers. 05/10/2021=Not appropriate yet- Currently still dependent with all transfers using hoyer. 06/21/2021= Patient continuing now to focus on LE strengthening to prepare for standing-unable to try today due to acute low back pain-  planning on attempting in new cert period. 09/15/2021- Patient has attempted standing 2x in past two week- max Assist of 2 people - only once was he successful to clearing his bottom from chair - Will continue to be a focus during the new certification. 12/13/2021= Patient has been limited secondary to increased overall low back pain during this certification and will require more time to focus on this goal.  12/6: not assessed this date, will assess at date when 2-3 PTs are present for assistance     Time 12     Period Weeks     Status GOAL not appropriate at this time - may attempt in  future once Patient presents with improved overall LE strength.                 PT LONG TERM GOAL #4    Title Pt will improve FOTO score by 10 points or more demonstrating improved perceived functional ability     Baseline FOTO 7 on 10/17; 03/15/21: FOTO 12; 05/10/2021 06/21/2021= 1; 09/15/2021= 9; 12/13/2021= Will issue next visit 12/6: 4; 06/06/2022= Will assess next visit; Goal not appropriate as patient is not ambulatory.     Time 12     Period Weeks     Status WITHDRAWN    Target date 11/30/2022         PT LONG TERM GOAL #5    Title Patient will perform sit to stand transfer with appropriate AD and max assist of 2 people with 75% consistency to prepare for pregait activities.     Baseline 09/15/2021= Patient unable to stand well- unable to clear his bottom off chair with Max assist of 2 persons. 12/13/2021- Goal not appropriate to try yet but will keep and roll over to next cert as shift continues to focus on transfers/standing; 4/24= Patient able to perform active ankle DF/PF with right LE and able to raise his knee into seated march and clear floor without physical assist today - as previously unable as well as lift right knee ext to near full ROM to improve strength for eventual standing. 09/07/2022- Goal continues to not be appropriate but patient is now standing some and will keep goal active; 11/23/2022= patient is now performing sit to stand with max A +2 and now able to clear his bottom off mat. 11/30/2022- Patient continues to perform sit to stand transfers with Max A+2 and able to clear bottom well off mat but not  erect yet. 05/31/2023- Patient is able to stand consistently with max A +2 - unable to achieve full knee or trunk ext yet is able to clear bottom off mat for improved LE weightbearing. 09/04/2023- Patient performed several Sit to stand requiring max A+2  with improving posture- able to ext R knee better - near terminal knee and able to clear bottom off mat- still not full but able to contract  glutes for standing as erect as possible.     Time 12     Period Weeks     Status PROGRESSING    Target date  11/27/2023       PT LONG TERM GOAL #6  Title Patient will tolerate sitting unsupported demonstrating erect sitting posture for 30 minutes with CGA to demonstrate improved back extensor strength and improved sitting tolerance.   Baseline 12/13/2021= Patient demonstrated unsupported sitting at edge of mat for approx 20 min;  12/29/2021- Patient performed approx 30 min of dynamic sitting activities today. 06/06/2022= Patient demonstrated ability to sit and perform static and dynamic UE/LE movement with only Supervision.   Time 12   Period Weeks   Status GOAL MET           7.  Patient will tolerate 2 minutes or more of standing in sit to stand lift or with max assist +2 in order to indicate improved lower extremity weightbearing tolerance for progression to standing in parallel bars. Baseline: 1 minute on most recent stand 02/21/22; 06/06/2022- Patient did attempt today after complaining of right LE pain last week. He attempted 3 stands using sit to stand lift. - all over 1 min- last one approx 48 sec- stopped due to fatigue. More erect standing in lift today - still poor gluteal strength but able to activate glutes and extend hips upon command but unable to hold > 5 sec. 07/28/2022- Will attempt standing next visit but patient has been able to stand since last progress note for up to 3 min at a time at his best. 09/07/2022-  attempted standing with max +2 and patient able to stand 15-20 sec so will now  revise goal; 11/23/2022- Patient at his best between last 2 visits was able to stand approx 50 sec with max A+2.  11/30/2022- Patient performed 4 trials of static standing - max A +2- clearing bottom and using forearms to bear weight  03/15/2023: Patient performed 2 trials of static standing - max A +2- clearing edge of mat and using forearms to bear weight for approximately 45 seconds, is able to  come into approximately 45 degrees of knee flexion from sit to stand; 05/31/2023- Patient has not attempted standing in 1 month but able to perform x 4 trials with max A +2 (PT and his mom) and able to clear his bottom off mat and attempted to activate his glutes and apply some forearm pressure - able to stand between 25-40 sec today. 09/04/2023- Patient performed 3 trials of standing ranging from 17 -47 sec - improving right knee terminal knee ext and more activation upon command with gluteals- able to clear mat table. Continued to require Max A+2 and blocking knees with forearm/trunk support.   Goal status: PROGRESSING Target date: 11/27/2023   8. Patient will tolerate sitting unsupported demonstrating ability to perform dynamic UE/LE activities for 30 minutes  independently to demonstrate ability to sit at edge of bed to eat or perform some ADL's/exercise for optimal quality of life.  Baseline: 06/06/2022- Patient able to sit and perform some UE/LE exercises but requires CGA at times for safety- he is able to static sit for 30 min with supervision. 07/27/2022= Patient able to now sit > 30 min with Supervision only - performing static and dynamic activities. Will keep goal active to ensure patient is consistent. 09/07/2022=  Patient able to demonstrate consistent ability to sit at edge of mat during visits             Goal Status: MET             Target date: 08/29/2022  9.  Patient will perform rolling left to right with Mod assist for improved independence with bed mobility to assist with dressing and positioning in bed. Baseline: 09/04/2023- Mother reported last visit that he was having some difficulty with assisting with bed mobility. Goal status: NEW Target date: 11/27/2023    10. Patient will perform active bridging -able to clear bottom   Off mat for 10 reps with PT/caregiver holding his feet    For improved trunk ext strength with standing and to    To assist with be mobility/positioning.   Baseline: 09/04/2023= (PT assisted- holding right LE In neutral and pressure on each foot) - Difficulty clearing bottom off mat. Goal status: NEW Target date: 11/27/2023           Plan     Clinical Impression Statement *** Patient continues to work on his strength despite some anxiety he endorsed during visit. He was able to relax better during session and demonstrating more power in right LE with improving eccentric control as he continues to develop his strength for upcoming pre-gait activities. Pt will continue to benefit from skilled physical therapy intervention to address impairments, improve QOL, and attain therapy goals.    Personal Factors and Comorbidities Comorbidity 3+;Time since onset of injury/illness/exacerbation    Comorbidities CVA, diabetes, Seizures    Examination-Activity Limitations Bathing;Bed Mobility;Bend;Caring for Others;Carry;Dressing;Hygiene/Grooming;Lift;Locomotion Level;Reach Overhead;Self Feeding;Sit;Squat;Stairs;Stand;Transfers;Toileting    Examination-Participation Restrictions Cleaning;Community Activity;Driving;Laundry;Medication Management;Meal Prep;Occupation;Personal Finances;Shop;Yard Work;Volunteer    Stability/Clinical Decision Making Evolving/Moderate complexity    Rehab Potential Fair    PT Frequency 1-2x / week    PT Duration 12 weeks    PT Treatment/Interventions ADLs/Self Care Home Management;Cryotherapy;Electrical Stimulation;Moist Heat;Ultrasound;DME Instruction;Gait training;Stair training;Functional mobility training;Therapeutic exercise;Balance training;Patient/family education;Orthotic Fit/Training;Neuromuscular re-education;Wheelchair mobility training;Manual techniques;Passive range of motion;Dry needling;Energy conservation;Taping;Visual/perceptual remediation/compensation;Joint Manipulations    PT Next Visit Plan core strength/motor control in short-sitting, sit to stand if able; Continue with progressive LE Strengthening. Stand activities as  appropriate.  Nustep and potential standing in // bars as appropriate.    PT Home Exercise Plan No changes to HEP today    Consulted and Agree with Plan of Care Patient;Family member/caregiver    Family Member Consulted             03/09/2023, 8:04 AM      10:16 AM, 11/13/23 Reyes LOISE London PT Physical Therapist - [facility]  10:16 AM 11/13/23

## 2023-11-15 ENCOUNTER — Ambulatory Visit

## 2023-11-15 ENCOUNTER — Ambulatory Visit: Admitting: Occupational Therapy

## 2023-11-15 ENCOUNTER — Encounter: Payer: Self-pay | Admitting: Occupational Therapy

## 2023-11-15 DIAGNOSIS — R262 Difficulty in walking, not elsewhere classified: Secondary | ICD-10-CM

## 2023-11-15 DIAGNOSIS — I6930 Unspecified sequelae of cerebral infarction: Secondary | ICD-10-CM

## 2023-11-15 DIAGNOSIS — M6281 Muscle weakness (generalized): Secondary | ICD-10-CM | POA: Diagnosis not present

## 2023-11-15 DIAGNOSIS — R2681 Unsteadiness on feet: Secondary | ICD-10-CM

## 2023-11-15 DIAGNOSIS — R269 Unspecified abnormalities of gait and mobility: Secondary | ICD-10-CM | POA: Diagnosis not present

## 2023-11-15 DIAGNOSIS — R278 Other lack of coordination: Secondary | ICD-10-CM

## 2023-11-15 DIAGNOSIS — R2689 Other abnormalities of gait and mobility: Secondary | ICD-10-CM

## 2023-11-15 DIAGNOSIS — H543 Unqualified visual loss, both eyes: Secondary | ICD-10-CM | POA: Diagnosis not present

## 2023-11-15 NOTE — Therapy (Signed)
 . Occupational Therapy Treatment Note    Patient Name: Paul Hall MRN: [id number] DOB:Jul 20, 1995, 28 y.o., male Today's Date: 11/15/2023  PCP: Dr. Bertell REFERRING PROVIDER: Dr. Bertell   .   OT End of Session - 11/15/23 1533     Visit Number [id number]    Number of Visits 180    Date for OT Re-Evaluation 01/16/24    OT Start Time 1535    OT Stop Time 1615    OT Time Calculation (min) 40 min    Equipment Utilized During Treatment tilt in space power wc    Activity Tolerance Patient tolerated treatment well    Behavior During Therapy WFL for tasks assessed/performed                 Past Medical History:  Diagnosis Date   Diabetes mellitus (HCC)    Hypertension    Stroke [facility])    Past Surgical History:  Procedure Laterality Date   IVC FILTER PLACEMENT ([facility] HX)     LEG SURGERY     PEG TUBE PLACEMENT     TRACHEOSTOMY     Patient Active Problem List   Diagnosis Date Noted   Sepsis due to vancomycin  resistant Enterococcus species (HCC) 06/06/2019   SIRS (systemic inflammatory response syndrome) (HCC) 06/05/2019   Acute lower UTI 06/05/2019   VRE (vancomycin -resistant Enterococci) infection 06/05/2019   Anemia 06/05/2019   Skin ulcer of sacrum with necrosis of muscle (HCC)    Urinary retention    Type 2 diabetes mellitus without complication, with long-term current use of insulin  (HCC)    Tachycardia    Lower extremity edema    Acute metabolic encephalopathy    Obstructive sleep apnea    Morbid obesity with BMI of 60.0-69.9, adult (HCC)    Goals of care, counseling/discussion    Palliative care encounter    Sepsis (HCC) 04/27/2019   H/O insulin  dependent diabetes mellitus 04/27/2019   History of CVA with residual deficit 04/27/2019   Seizure disorder (HCC) 04/27/2019   Decubitus ulcer of sacral region, stage 4 (HCC) 04/27/2019   ONSET DATE: 01/2019  REFERRING DIAG: CVA/COVID-19  THERAPY DIAG:  Other lack of coordination  Muscle weakness  (generalized)  History of CVA with residual deficit  Rationale for Evaluation and Treatment Rehabilitation  SUBJECTIVE:  Pt.Caregiver reports that pt. has been working on Development worker, international aid stems to assist with making a floral wreath.  SUBJECTIVE STATEMENT:  Pt.  reports he got jostled in the [place] during transport today and his stomach is upset.   Pt accompanied by: self and family member  PERTINENT HISTORY:  Pt. is a 28 y.o. male who was diagnosed with COVID-19, and CVA with resultant quadriplegia. Pt. Was then hospitalized with VRE UTI. PMHx includes: urinary retention, seizure disorder, obstructive sleep disorder, DM Type II, Morbid obesity.   PRECAUTIONS: None  WEIGHT BEARING RESTRICTIONS No  PAIN:  No reports of pain Are you having pain? 1-2/10 pain in L shoulder  FALLS: Has patient fallen in last 6 months? No  LIVING ENVIRONMENT: Lives with: lives with their family Lives in: House/apartment Stairs: No Level Entry Has following equipment at home: Wheelchair (power) and hoyer lift, sit to stand lift  PLOF: Independent  PATIENT GOALS: To be able to engage in more daily care tasks.  OBJECTIVE:   HAND DOMINANCE: Right  ADLs:  Transfers/ambulation related to ADLs: Eating: Pt. reports being able to hold standard utensils, and is starting to engage more in self-feeding tasks, hand to  mouth patterns. Pt. Reports that he does as much as he can with the task, and family assists  with the remainder of the task. Grooming: Pt. is able to initiate holding an electric toothbrush, and brush his teeth. Family assists LB Dressing: Total Assist UE dressing: Pt. is now able to reach up to actively assist with grasping , and pulling his gown down. Toileting:  Total Assist Bathing: MaxA UB, Total assist LB Tub Shower transfers: N/A Equipment: See above    IADLs: Shopping: Relies on family to assist Light housekeeping: Total Assist Meal Prep: Total Assist Community mobility:    Medication management:  Total Assist  Financial management: N/A Handwriting: Not legible: Pt. Is able to hold a pen with the left hand, and initiate marking the page. Pt.'s eye glasses were not available  MOBILITY STATUS:  Power w/c  POSTURE COMMENTS:  Pt. Requires position changes in his power w/c  ACTIVITY TOLERANCE: Activity tolerance:  Fair  FUNCTIONAL OUTCOME MEASURES: FOTO: 40 TR score: 45  05/25/2022:   FOTO: 50 TR score: 45  07/20/22: FOTO 55  03/08/23: 51  UPPER EXTREMITY ROM     Active ROM Left eval Left  03/21/2022 Left 05/25/2022 L  07/20/22 Left 07/25/2022 Left  08/15/2022 Left  09/26/2022 Left  11/16/2022 Left 12/28/2022 Left 03/13/23 Left 05/10/23 Left 10/23/23  Shoulder flexion 119 123 125 130  136 138 130 142 144 144 148  Shoulder abduction 110 115 115 115  118 121 125 142 140 140 140  Shoulder adduction              Shoulder extension              Shoulder internal rotation              Shoulder external rotation              Elbow flexion 135(135) 145 145 145  145    145 145   Elbow extension -27(-18) -21(-20) -20(-16)  -25(-10) -23(-10) -21(-10) -20(-18) -20(-18) -20(-17) -17(-16) -12(-8)  Wrist flexion              Wrist extension 10(50) 19(50) 28(50)  30(50) 35(55) 36(55) 36(60) 40(60) 45(60) 45(60) 42(54)  Wrist ulnar deviation              Wrist radial deviation              Wrist pronation            60(74)  Wrist supination   Limited by flexor tone       10(15)  30(38)  (Blank rows = not tested)    Active ROM Right eval Right 03/21/2022 Right 05/25/2022 R  07/20/22 Right  07/25/22 Right 08/15/2022 Right 09/26/2022 Right 11/16/2022 Right 12/28/2022 Right 03/13/23 Right 05/10/23 Right 10/23/23  Shoulder flexion 106 scaption 105  Scaption 62 flexion 110 scaption 100  105 105 105 112 Scaption 123 Scaption 122  Scaption 142 Scaption  Shoulder abduction 114 90 97 100  105 117 120 124 124 122 144  Shoulder adduction              Shoulder  extension              Shoulder internal rotation              Shoulder external rotation              Elbow flexion 120(130) 145 145 140  144 140 142 148 148 145  Elbow extension -45(-35) -22(-35) -28(-22)  -32(-21) -32(-28) -28(-26) -28(-28) -27(-26) -27(-40) -38(-35) -34(-26)  Wrist flexion              Wrist extension -30(10) -25(10) -10(30)  -10(20) -10(25) -20(40) -30(10) -22(34) -22(35) -32(22) -30(-8)  Wrist ulnar deviation              Wrist radial deviation              Wrist pronation              Wrist supination   Limited by flexor tone                12/28/2022: Thumb radial abduction: Right: 12(46), Left: 40(54)  Digits: Bilateral 2nd through 5th digit: MP Hyperextension with PIP, an DIP flexion    10/23/23:  Bilateral 2nd through 5th digit MP hyperextension with PIP, and DIP flexion: Pt. has been intermittently able to initate active diigt PIP, and DIP extension following Botox during this past recertification/progress reporting period.     UPPER EXTREMITY MMT:     Left eval Left 03/21/2022 Left  05/25/2022 L 07/20/22 Left  08/15/2022 Left 09/26/2022 Left 11/16/2022 Left 12/28/2022 Left  03/13/2023 Left  05/10/23 Left 10/23/23  Shoulder flexion 3-/5 3/5 3+/5 4+/5 4+/5 4+/5 4+/5 4+/5 4+/5 4+/5 4+/5  Shoulder abduction 3-/5 3-/5 4-/5 4+/5 4+/5 4+5 4+/5 4+/5 4+/5 4+/5 4+/5  Shoulder adduction             Shoulder extension             Shoulder internal rotation             Shoulder external rotation             Elbow flexion 3/5 3+/5 4-/5 4+/5 5/5 5/5 5/5 5/5 5/5 5/5 5/5  Elbow extension 2-/5 2/5  4+/5 4+/5 4+/5  4/5 4/5 4/5 through available range 4+/5 through available range  Wrist flexion             Wrist extension 2/5 2+/5 3-/5  3-/5 3-/5 3-/5 3/5 3/5 3/5 3+/5  Wrist ulnar deviation             Wrist radial deviation             Wrist pronation             Wrist supination             (Blank rows = not tested)  Right eval Right 03/21/2022 Right  05/25/2022  R 07/20/22 Right 08/15/2022 Right 09/26/2022 Right 11/16/2022 Right 12/28/2022 Right 03/13/2023 Right 05/10/23 Right 10/23/23  Shoulder flexion 3-/5 scaption 3-/5 3-/5 3+/5 3+/5 3+/5 3/5 3/5 3/5 3+/5 4-/5  Shoulder abduction 3-/5 3-/5 3-/5 4/5 4/5 4/5 4/5 4/5 4/5 4/-5 4-/5  Shoulder adduction             Shoulder extension             Shoulder internal rotation             Shoulder external rotation             Elbow flexion 3/5 3/5 3+/5 4/5 4+/5 4+/5 4+/5 4+/5 4+/5 4+/5 4+/5  Elbow extension 2-/5 2/5 3-/5 4+/5 4+/5 4+/5 4+/5 4-/5 4-/5 4-/5 4-/5 through available range  Wrist flexion             Wrist extension 2-/5 2-/5 2/5 2/5 2/5 2/5 2/5 2/5 2/5 2/5 2/5  Wrist ulnar deviation  Wrist radial deviation             Wrist pronation             Wrist supination               HAND FUNCTION:  Grip strength: Right: 0 lbs; Left: 0 lbs and Lateral pinch: Right: 5 lbs, Left: 2 lbs  03/21/2022: Lateral pinch: Right: 3.5 lbs, Left: 2 lbs  07/20/22: Grip strength: Right: 3 lbs; Left: 4 lbs and Lateral pinch: Right: 5 lbs, Left: 3 lbs  08/15/2022:  Grip strength: Right: 0 lbs; Left: 5 lbs and Lateral pinch: Right: 2 lbs, Left: 4 lbs  09/26/2022  Grip strength: Right: 0 lbs; Left: 8 lbs and Lateral pinch: Right: 2 lbs, Left: 5 lbs  11/16/2022  Grip strength: Right: 0 lbs; Left: 8 lbs  9/25/20204  Grip strength: Right: 0 lbs; Left: 8 lbs  Grip strength: Right: 0 lbs; Left: 8 lbs   03/08/23:  Grip strength: Right: 0 lbs without PROM, 5 lbs after PROM, Left 8 lbs  05/10/23:  Grip strength: TBD  Bilateral digit PIP/DIP flexion contractures with MP hyperextension with attempts for AROM. Pt. is able to tolerate AROM to the bilateral digits at the initial evaluation however, has a history of pain in the digits.  COORDINATION: Eval: Pt. is unable to grasp 9-hole test pegs. Pt. is able to initiate grasping larger pegs, and is able to hold a pen in the left hand.  07/20/22: 2  min 36 seconds to remove 9 pegs from 9 hole peg test - cues to locate pegs 2/2 low vision. Pt. is able to initiate grasping larger pegs on R hand and is able to hold a pen in the left hand.  08/15/2022:    Vision, and sensation limiting accuracy of 9 hole peg test results. Pt. Was able to grasp and remove vertical pegs intermittently with cues.    05/10/23: TBD  SENSATION: Light touch: Impaired   EDEMA:  N/A  MUSCLE TONE: BUE flexor Spasticity  COGNITION Overall cognitive status: Continue to assess in functional context  VISION:   Subjective report: Pt. was not wearing glasses at the time of the initial eval.  Baseline vision: Vision is very limited. Wears glasses all the time Visual history: History of impaired vision following CVA. Pt. Has received treatment through the [facility] low vision rehabilitation program.   VISION ASSESSMENT: Impaired To be further assessed in functional context  PERCEPTION: Impaired   PRAXIS: Impaired: motor planning  OBSERVATIONS:  Pt reports being on Tramadol   TODAY'S TREATMENT 10/30/23   Therapeutic Ex.:  -BUE PROM was performed with slow prolonged gentle stretching of bilateral hands including wrist/digit flexion/extension including Mps, PIPs, DIPs of all digits, and wrist flexion/extension, and forearm supination  Self-Care:  -Pt.trialed donning and doffing Motus Nova hand device as pt and caregiver have received home device but having difficulty with fitting. Reviewed adjustability of hand grip. Noted pain with thumb under bar for grip, thumb placed adjacent to fingers with decreased pain. Unable to fully grip bar due to DIP contractures - reports unable to schedule Botox appointment until Nov.       PATIENT EDUCATION: Education details: Incorporating Active Right hand supination during self-feeding tasks, grasping and releasing utensils, grasping snack foods from plate. Person educated: Patient and Parent Education method:  Explanation Education comprehension: verbalized understanding  HOME EXERCISE PROGRAM:    Incorporating  the right hand during self-feeding tasks.   GOALS: Goals reviewed  with patient? Yes  SHORT TERM GOALS: Target date: 12/05/2023   To assess splint fit, and make appropriate adjustments to promote good skin integrity through the palmar surface of the bilateral hands.  Baseline: 05/25/22: Goal currently met, however ongoing as needs to assess splint fit arise. 03/23/2022: Pt. is wearing splints a couple of hours at night bilateral resting hand splints. 03/21/2022: Pt. is wearing splints a couple of hours at night bilateral resting hand splints. Goal status: Deferred   LONG TERM GOALS: Target date: 01/15/2024   FOTO score will Improve by 2 points for Pt. perceived improvement with the assessment specific ADL/IADL tasks.  Baseline: 03/08/23: 51; 12/28/2022: FOTO score: 56; 11/16/2022: 52 09/27/2022: 51 07/20/2022: FOTO 55 05/25/2022: FOTO score: 50,  TR score: 45 Eval: FOTO score: 40, TR score: 45 Goal status: Achieved   2.   Pt. will independently perform oral care for 100% of the task after complete set-up. Baseline: 05/10/23: Pt. Performs 90% of the task. Pt.'s mother assists with 10% of the task for thorough cleaning the hard to reach places way in the back of the oral cavity.03/08/23: not as much assist required proximally, but to adjust positioning of toothbrush; 02/13/23: Pt. Continues to require assist proximally 01/09/2023: Continue 12/28/2022:  Pt. Continues to complete 90% of the task with complete set-up, with visual cues, and occassional assist of the right elbow. 11/16/2022: Pt. Completes 90% of the task with complete set-up. 09/26/2022: Completes 90% of the task, proximal assist at times required at the elbow.  08/15/2022: Completes 90% of the task, proximal assist at times required at the elbow. 07/20/22: completes 90% of task, limited by shoulder flexion. 05/25/2022:  Pt. Is able to initiate and  perform oral care for approximately 90% of the task. Complete set-up required. Assi needed only for the very back teeth. 03/23/2022: Pt. Is able to initiate and perform oral care for approximately 75% of the task. Pt. Requires assist at proximally at the elbow for through oral care. 03/21/2022: Pt. Is able to initiate and perform oral care for approximately 75% of the task. Pt. Requires assist at proximally at the elbow for through oral care. Eval: Pt. is able to initiate using an electric toothbrush. Pt. requires assist for set-up, and assist for thoroughness, and as he Pt. fatigues. Goal status:  Partially met, Goal discontinued  3.  Pt. Will be modified independence with self-feeding for 100% using a swivel spoon, and standard fork after complete set-up Baseline: 05/10/2023: Pt. feeds self 95-100% of the meal. with occasional assist to reposition the utensil. 03/08/23: indep with cup with a handle and lid, assist to reposition eating utensils in hand;  02/13/23:  Pt. Is now able to consistently, and efficiently use a swivel spoon for eating cereal with the left hand.01/09/2023: Continue 12/28/2022: Pt. Is now feeding himself 90% of the time using a swivel spoon with the left hand, and 95% of the time with the fork. Pt. Continues to have difficulty with cutting food. 11/16/2022: 100% with finger foods, Pt. Is initiating with a swivel spoon, and universal cuff for fork use, however requires assist for consistency, and accuracy.  09/26/2022: 90% using the spoon, assist at time with the forearm motion, and grip on the utensils. 100% for finger foods.  08/15/2022:  90% using the spoon, assit at time with the forearm motion, and grip on the utensils. 100% for finger foods-independent with set-up- unsupervised.  07/20/22: self-feeds cereal using spoon 90% of task. 05/25/2022: Pt. Is able to use a  spoon to scoop cereal when feeding himself cereal 85% of the time. Pt. Is able to feed himself snack/finger foods 100% of the  time. Pt. Continues to work on consistency of  stabilizing a cup/mug when drinking. Pt. Is able to grasp a water  bottle with assist initially, with assist tapering off as he drinks.03/23/2022: Pt. Is able to perform scooping cereal for 75% of the time. Pt. required assist, and support at the left elbow, and Pt. Presents with limited forearm supination when using the spoon, and bringing it towards his mouth. Pt. Is able to use a fork to spear items, and perform the hand to mouth pattern.  03/21/2022: Pt. Is able to perform scooping cereal for 75% of the time. Pt. required assist, and support at the left elbow, and Pt. presents with limited forearm supination when using the spoon, and bringing it towards his mouth. Pt. Is able to use a fork to spear items, and perform the hand to mouth pattern.  Eval: Pt. is able to hold standard standard utensils. Pt. Performs as much of the task as he, can and has assistance for the remainder. Goal status:  Improved, Achieved  4.  Pt. will improve grasp patterns and consistently grasp 1/4 objects for ADL, and IADL tasks.  Baseline: 10/23/2023: Pt. is able to initiate grasping small objects visual cues, and increased tim, however continues to work towards grasping 1/4 objects efficiently. 08/27/23: Pt. is able to grasp flat discs, and sustain a secure hold on them while moving the UE through various planes. 08/07/23: Continue 05/31/2023: Continue 05/10/23: Pt. is consistently able to grasp 1/2 objects with visual cues. Pt. Requires assist, and increased visual cues to grasp 1/4 objects on an elevated plane.  Pt. Is unable to grasp the flat objects at the tabletop surface. Pt. Is grasping grasping small puppy vitamins.03/08/23: 1/2 objects efficiently; 02/13/2023: 1/2 objects efficiently 01/09/2023: Continue 12/28/2022: Continues to grasp 1/4 pegs with min a + visual cues, consistently grasping 1/2 objects with visual cues 11/16/2022: Continues to grasp 1/4 pegs with min a +  visual cues, consistently grasping 1/2 objects with visual cues 09/26/2022: Continues to grasp 1/4 pegs with min a + visual cues, consistently grasping 1/2 objects with visual cues. 08/15/2022: grasps 1/4 pegs with min a + visual cues, consistently grasping 1/2 objects with visual cues. 07/20/22: grasps 1/4 pegs with min a + visual cues, consistently grasping 1/2 objects with visual cues. 05/25/2022: Pt. Is working on improving consistency of grasping 1/2 objects with visual cues.  03/23/2022: Pt. Is able to grasp 1 objects consistently,and continues to work on the hand patterns needed to grasp 1/2 objects.03/21/2022: Pt. Is able to grasp 1 objects consistently,and continues to work on the hand patterns needed to grasp 1/2 objects. Eval: Pt. is able to grasp 1 objects intermittently using a lateral grasp pattern. Goal status: Ongoing  5.  Pt. will independently write his name legibly with letter sizes under 1. Baseline: 05/10/23: Deferred 03/08/23: Not addressed this period; 02/13/2023: Continue 01/09/2023: Continue. 12/28/2022: Pt. is now using an IPad Pen. 11/16/2022: Pt. Continues to be able to write name with smaller letter size. Pt. Is signing name with a computer styus. Pt. Requires visual cues, and assist 09/26/2022: Pt. Continues to be able to write name with smaller letter size. Pt. Is signing name with a computer styus. Pt. Requires visual cues, and assist. 08/15/2022: Pt. Is able to write name with smaller letter size. Pt. Is signing name with a computer styus. Pt. Requires visual  cues, and assist. 07/20/22: stabilizing assist to write name with 75% legibility with 2 letters. 05/25/2022: Pt. to continue to work towards formulating grasp patterns in preparation for grasping a large width pen. Pt. Requires visual cues. 03/23/2022: Pt. Is able to write his name with modA, however has difficulty with formulating letter sizes less than 2 in size with 50% legibility for the 3 letters of his  name.03/21/2022: Pt. Is able to write his name with modA, however has difficulty with formulating letter sizes less than 2 in size with 50% legibility for the 3 letters of his name. Eval: Pt is able to hold a thin marker with his left hand, and formulate a line, and initiate a circular pattern (Pt. without glasses today) Goal status: Deferred  6. Pt. Will reach up to comb/brush his hair with minA.  Baseline: 05/10/23: Pt. is able to use a pick independently with the left hand with occasional assist to the far side of the left his head. 03/08/23: Pt uses pick in L hand to reach 75% of his head; unable to reach R side of head; 02/13/2023:Pt. is able to use a hair pick with the left hand on the right side of his head, top, and back of the head. Pt. Continues to have difficulty reaching further to the right side of his head with the left. 01/09/2023: Continue 12/28/2022: Pt. Is progressing, and is now able to use a hair pick with the left hand on the right side of his head, top, and back of the head. Pt. Continues to have difficulty reaching further to the right side of his head with the left. 11/16/2022: Pt. Is able to use a hair pick for the left side of his head using his left hand. Pt. Presents with difficulty reaching to the right side of his head.09/26/2022: Pt. Is able to use a hair pick for the left side of his head using his left hand. Pt. Presents with difficulty reaching to the right side of his head. 5/13/202: 75% using a hair pick, assist to the right side of the head. 07/20/22: reaches 75% of head, assist for far R side of head, fatigues quickly. 05/25/2022: Pt. Is able to reach up with the left hand to the left side, top, and back of his head. 03/23/2022: Pt. is now able to more consistently initiate reaching up to his head with his left hand in preparation for haircare12/18/2023: Pt. is now able to more consistently initiate reaching up to his head with his left hand in preparation for haircare. Eval: Pt.  is able to initiate reaching up for hair care with a long handled brush, however is unable to sustain UE's in elevation to perform the task.     Goal status: Achieved  7. Pt. Will independently navigate the w/c through his environment with minA with visual scanning, and hand placement on the controls.  Baseline: 11/16/2022: Deferred 2/2 vision 09/26/2022: Pt. Is now navigating his w/c ouside the home, and around his block. 08/15/2022: Pt. Requires minA to setup hand on controls and MIN cues to navigate the w/c in wide spaces, requires MIN - MOD cues to navigate the w/c through more narrow doorways, and tighter turns - varies based on good vs bad vision days.07/20/22: Pt. Requires minA to setup hand on controls and MIN cues to navigate the w/c in wide spaces, requires MIN - MOD cues to navigate the w/c through more narrow doorways, and tighter turns - varies based on good vs bad vision days.  05/25/2022: Pt. Requires minA  and  cues to navigate the w/c in wide spaces, and requires Mod cues to navigate the w/c through more narrow doorways, and tighter turns. Pt. Requires max cues for scanning through the environment, and moderate cues for hand placement on the controls.     Goal status: Deferred 2/2 vision    8. Pt. Will improve bilateral grip strength to be able to independently grasp, and pull up blankets, and linens.   Baseline: 05/10/23: Deferred 03/08/23: R grip 0-5 lbs, L grip 8 lbs; pt reports he can consistently pull up blankets and linens independently.  02/13/2023: Continue. Pt. presents with digit PIP, and DIP flexor tightness. 01/09/2023: Continue 12/28/2023: R: 0#, L: 5# Pt. Is now able to more consistently grasp and pull blankets up over him. 11/16/2022: R: 0 L: 8# Pt. Is more consistently grasping his blankets and attempting to pull them up. 09/26/2022: R: 0  L: 8# Pt. is attempting to grasp, and pull blankets up more, using mostly the left hand. 08/15/2022: Pt. has difficulty securely holding, and pulling  up blankets, and linens.    Goal status:Deferred  9. Pt. will consistently actively control the releasing of blankets, covers, and linens from his hands once they are in the desired position over him. Baseline:12/28/2022: Pt. Is consistently actively able to release linens once they are in the desired position over him. 11/16/2022: Pt. continues to improve with actively releasing blankets form his hands. 09/26/2022: Pt. is improving with actively releasing blankets form his hands. 08/15/2022: Pt. has difficulty with controlled releasing of blankets/linens from his grasp.     Goal status: Achieved    10. Pt. Will improve bilateral UE strength by 2 MM grades to assist with ADLs, and IADLs  Baseline: 10/23/2023: Right: shoulder flexion: 4-/5, abduction: 4-/5, elbow flexion: 4+/5 , extension: 4-/5 wrist extension: 2/5; Left: shoulder flexion: 4+/5, abduction: 4+/5, elbow flexion: 5/5 , extension: 4+/5  wrist extension: 3+/5  08/27/23:  Right: shoulder flexion: 3+/5, abduction: 4-/5, elbow flexion: 4+/5 , extension: 4-/5 wrist extension: 2/5; Left: shoulder flexion: 4+/5, abduction: 4+/5, elbow flexion: 5/5 , extension: 4/5  wrist extension: 3/5  08/07/23: Continue 05/31/2023: Continue 05/10/23: Right: shoulder flexion: 3+/5, abduction: 4-/5, elbow flexion: 4+/5 , extension: 4-/5 wrist extension: 2/5; Left: shoulder flexion: 4+/5, abduction: 4+/5, elbow flexion: 5/5 , extension: 4/5  wrist extension: 3/5 03/13/2023: Right: shoulder flexion: 3/5, abduction: 4/5, elbow flexion: 4+/5 , extension: 4-/5 wrist extension: 2/5; Left: shoulder flexion: 4+/5, abduction: 4+/5, elbow flexion: 5/5 , extension: 4/5  wrist extension: 3/5 03/08/23: NT this date d/t as pt arrived late; 02/13/2023: Continue 01/09/2023: Continue 12/28/2022: Right: shoulder flexion: 3/5, abduction: 4/5, elbow flexion: 4+/5 , extension: 4-/5 wrist extension: 2/5; Left: shoulder flexion: 4+/5, abduction: 4+/5, elbow flexion: 5/5 , extension: 4/5  wrist  extension: 3/5 Right: shoulder flexion: 3+/5, abduction: 4/5, elbow flexion: 4+/5 , extension: 4+/5 wrist extension: 2/5; Left: shoulder flexion: 4+/5, abduction: 4+/5, elbow flexion: 5/5 , extension: 4+/5 wrist extension: 3-/5 11/16/2022: Right: shoulder flexion: 3/5, abduction: 4/5, elbow flexion: 4+/5 , extension: 4+/5 wrist extension: 2/5; Left: shoulder flexion: 4+/5, abduction: 4+/5, elbow flexion: 5/5 , extension:  wrist extension: 3-/5 Right: shoulder flexion: 3+/5, abduction: 4/5, elbow flexion: 4+/5 , extension: 4+/5 wrist extension: 2/5; Left: shoulder flexion: 4+/5, abduction: 4+/5, elbow flexion: 5/5 , extension: 4+/5 wrist extension: 3-/5 Goal status: Ongoing  11. Pt. will improve right wrist extension by 10 degrees to initiate reaching for items in preparation for ADLs. Baseline: 10/23/23: -30(-8)  08/23/2023: R Wrist Ext: -31(-8)  08/07/23: Continue 05/31/23: Continue 05/10/23: Right wrist extension: -67(77) 03/13/23: -22(35) 03/08/23: NT this date as pt arrived late; 02/13/2023: Continue 01/09/2023: Continue 12/28/2022: -22(34) 11/16/2022: -30(10)    Goal status: Ongoing      12. Pt. Will require donn/doff a Tank top T-shirt efficiently with supervision and full set-up.    Baseline:10/23/23: Independent doff the shirt/tank, MaxA  donning. 10/23/23: Pt. Is able to independently doff his shirt, requiring assist to lean forward and loosen the back. 08/07/23: Continue 05/31/2023: Pt. Requires maxA donning a pullover sweatshirt, Mod-maxA doffing it. T-shirt tank TBD 05/10/23: Pt. Presents with difficulty completing, and requires increased time to complete    Goal status: Ongoing    13. Pt. Will independently perform bilateral/bimanual hand tasks needed to fold towels/linens/laundry Baseline: 10/23/23: Pt. Continues to have difficulty folding laundry, and linens. 08/23/23: Pt. Continues to have difficulty using bilateral/bimanual hand tasks needed to fold towels/linens/laundry. 08/07/23: Continue 05/31/23:  Continue 05/10/23:  Pt. Has difficulty folding towels/lines/laundry using bilateral hands Goal status:Ongoing  14. Pt. Will independently, and efficiently reach his right arm over the side of the chair to pet his dog.    Baseline: 08/23/2023: Pt. Is now able to reach over the side of the armrest of his recliner chair to pet his dog.  08/07/23: Continue 05/31/2023: Continue 05/10/23: Pt. is unable to efficiently reach his right UE over the side of his recliner chair to pet his dog.    Goal status: Achieved    15. Pt. Will independently, and efficiently reach out in front of him to efficiently place items onto his table while sitting in his recliner.     Baseline: 10/23/23: Pt. Is improving with functional reaching with the left UE out in front of him to place items onto the table. Pt. has difficulty reaching out with the right hand to  place items on the table. 08/23/2023: Pt. has difficulty consistently reaching out in front of him to place items on the on a tabletop surface with the right hand. 08/07/23: Continue 2/26/225: Continue 05/10/23: Pt. has difficulty reaching out in front of him far enough to efficiently place items on the table while sitting in a recliner chair.    Goal status: Ongoing     16. Pt. Will improve bilateral hand supination by 10 degrees to be able  actively turn items in his hand to look at them independently and efficiently during ADLs/IADLs,    Baseline: 10/23/23: Right: unable, L: supination 30(38)    Goal status: New      17. Pt. Will independently, and efficiently transition between picking up finger foods for dipping, and  picking up utensils , then placing them down on the tabletop surface through 75% of the meal with minimal cues.    Baseline: Pt.requires heavy cues, and assist to grasp finger foods, and utensils, and transition between the two.    Goal status: New   ASSESSMENT:  CLINICAL IMPRESSION:    Pt./caregivers requesting to review fit for [employer] hand device.  Unable to achieve full grip - educated on repositioning hand bar and use of heat/stretching prior to use of device. Second trial following PROM with decreased pain to don device. DIP tightness limiting ability to achieve grip - reports unable to schedule next Botox appointment until Nov. Pt.'s caregivers continue to be very supportive to continue working towards improving BUE functioning, ROM, strength, motor control, [facility] skills, as well as visual compensatory strategies in order to maximize independence with  daily ADLs, and IADL functioning.     PERFORMANCE DEFICITS in functional skills including ADLs, IADLs, coordination, dexterity, proprioception, ROM, strength, pain, FMC, GMC, decreased knowledge of use of DME, and UE functional use, cognitive skills including safety awareness, and psychosocial skills including coping strategies, environmental adaptation, habits, and routines and behaviors.   IMPAIRMENTS are limiting patient from ADLs, IADLs, leisure, and social participation.   COMORBIDITIES may have co-morbidities  that affects occupational performance. Patient will benefit from skilled OT to address above impairments and improve overall function.  MODIFICATION OR ASSISTANCE TO COMPLETE EVALUATION: Maximum or significant modification of tasks or assist is necessary to complete an evaluation.  OT OCCUPATIONAL PROFILE AND HISTORY: Comprehensive assessment: Review of records and extensive additional review of physical, cognitive, psychosocial history related to current functional performance.  CLINICAL DECISION MAKING: High - multiple treatment options, significant modification of task necessary  REHAB POTENTIAL: Fair    EVALUATION COMPLEXITY: High    PLAN: OT FREQUENCY: 2 x's a week  OT DURATION:12 weeks  PLANNED INTERVENTIONS: self care/ADL training, therapeutic exercise, therapeutic activity, neuromuscular re-education, manual therapy, passive range of motion, patient/family education,  and cognitive remediation/compensation RECOMMENDED OTHER SERVICES: PT  CONSULTED AND AGREED WITH PLAN OF CARE: Patient and family member/caregiver  PLAN FOR NEXT SESSION: Review HEP   Elston Slot, M.S. OTR/L  11/15/23, 3:52 PM  ascom [phone]

## 2023-11-16 DIAGNOSIS — I6389 Other cerebral infarction: Secondary | ICD-10-CM | POA: Diagnosis not present

## 2023-11-16 DIAGNOSIS — R32 Unspecified urinary incontinence: Secondary | ICD-10-CM | POA: Diagnosis not present

## 2023-11-16 DIAGNOSIS — L8994 Pressure ulcer of unspecified site, stage 4: Secondary | ICD-10-CM | POA: Diagnosis not present

## 2023-11-16 NOTE — Therapy (Signed)
 OUTPATIENT PHYSICAL THERAPY TREATMENT   Patient Name: Paul Paul [name] MRN: [id number] DOB:04-18-1995, 28 y.o., male Today's Date: 11/16/2023  PCP:  [facility] PROVIDER:  Morene Sous, PA-C   Paul [name] End of Session - 11/15/23 1732     Visit Number 172    Number of Visits 184   date corrected from most recent recert   Date for Paul [name] Re-Evaluation 11/27/23    Authorization Type [employer]/ [place] [employer]    Authorization Time Period 01/04/21-03/29/21; Recert 03/24/2021-06/16/2021; Recert 09/15/2021- 12/08/2021; Recert 12/13/2021-03/07/2022    Progress Note Due on Visit 180    Paul [name] Start Time 1616    Paul [name] Stop Time 1658    Paul [name] Time Calculation (min) 42 min    Equipment Utilized During Treatment Gait belt    Activity Tolerance Patient tolerated treatment well    Behavior During Therapy WFL for tasks assessed/performed                               Past Medical History:  Diagnosis Date   Diabetes mellitus (HCC)    Hypertension    Stroke (HCC)    Past Surgical History:  Procedure Laterality Date   IVC FILTER PLACEMENT ([facility] HX)     LEG SURGERY     PEG TUBE PLACEMENT     TRACHEOSTOMY     Patient Active Problem List   Diagnosis Date Noted   Sepsis due to vancomycin  resistant Enterococcus species (HCC) 06/06/2019   SIRS (systemic inflammatory response syndrome) (HCC) 06/05/2019   Acute lower UTI 06/05/2019   VRE (vancomycin -resistant Enterococci) infection 06/05/2019   Anemia 06/05/2019   Skin ulcer of sacrum with necrosis of muscle (HCC)    Urinary retention    Type 2 diabetes mellitus without complication, with long-term current use of insulin  (HCC)    Tachycardia    Lower extremity edema    Acute metabolic encephalopathy    Obstructive sleep apnea    Morbid obesity with BMI of 60.0-69.9, adult (HCC)    Goals of care, counseling/discussion    Palliative care encounter    Sepsis (HCC) 04/27/2019   H/O insulin  dependent diabetes mellitus 04/27/2019   History  of CVA with residual deficit 04/27/2019   Seizure disorder (HCC) 04/27/2019   Decubitus ulcer of sacral region, stage 4 (HCC) 04/27/2019    REFERRING DIAG: Cerebral infarction, unspecified   THERAPY DIAG:  Other lack of coordination  Muscle weakness (generalized)  History of CVA with residual deficit  Difficulty in walking, not elsewhere classified  Unsteadiness on feet  Other abnormalities of gait and mobility  Abnormality of gait and mobility  Rationale for Evaluation and Treatment Rehabilitation  PERTINENT HISTORY: Paul Paul [name] is a 26yoM who presents with severe weakness, quadriparesis, altered sensorium, and visual impairment s/p critical illness and prolonged hospitalization. Paul [name] hospitalized in October 2020 with ARDS 2/2 COVID19 infection. Paul [name] sustained a complex and lengthy hospitalization which included tracheostomy, prolonged sedation, ECMO. In this period Paul [name] sustained CVA and SDH. Paul [name] has now been liberated from tracheostomy and G-tube. Paul [name] has since been hospitalized for wound infection and UTI. Paul [name] lives with parents at home, has hospital bed and left chair, hoyer lift transfers, and power WC for mobility needs. Paul [name] needs heavy physical assistance with ADL 2/2 BUE contractures and motor dysfunction   PRECAUTIONS: Fall  SUBJECTIVE:  Patient reports stomach upset and not feeling great today.   PAIN:  Are you having pain? No. But feeling  tight.    TODAY'S TREATMENT:  - 11/15/2023    TE:  Passive stretching to bilateral ankles, knee, hips with most tightness on R side yet much more relaxed with B hamstring stretch today vs. Past visits.   Ankle DF/PF 2 x 10 ea LE Knee ext with 3# AW x 15, then x 20 on 2nd set ea LE Resistive hip add 3#  2 x 10  Resistive hip abd 3# x 10  LAQ with eccentric control x 5 each L (2 x 10 reps)  Resistive Hip march 3# AW alt LE  2 x 10 reps            PATIENT EDUCATION: Education details: Exercise technique with VC   HOME  EXERCISE PROGRAM:  Access Code: [email or web address] URL: [email or web address] Date: 03/23/2022 Prepared by: Paul Paul [name]  Exercises - Supine Bridge  - 3 x weekly - 3 sets - 10 reps - 2 hold - Supine Gluteal Sets  - 3 x weekly - 3 sets - 10 reps - 5 sec hold - Supine Quad Set  - 1 x daily - 3 x weekly - 3 sets - 10 reps - 5 hold - Seated Long Arc Quad  - 1 x daily - 7 x weekly - 3 sets - 10 reps - Seated Hip Adduction Squeeze with Ball  - 1 x daily - 3 x weekly - 3 sets - 10 reps - 5 hold - Seated Hip Abduction  - 1 x daily - 3 x weekly - 3 sets - 10 reps - 2 hold    Paul [name] Short Term Goals -       Paul [name] SHORT TERM GOAL #1   Title Paul [name] will be independent with HEP in order to improve strength and balance in order to decrease fall risk and improve function at home and work.    Baseline 01/04/2021= No formal HEP in place; 12/12 no HEP in place; 05/10/2021-Patient and his father were able to report compliance with curent HEP consisting of mostly seated/reclined LE strengthening. Both verbalize no questions at this time.    Time 6    Period Weeks    Status Achieved    Target Date 02/15/21            Paul [name] LONG TERM GOAL #1     Title Patient will increase BLE gross strength by 1/2 muscle grade to improve functional strength for improved independence with potential gait, increased standing tolerance and increased ADL ability.     Baseline 01/04/2021- Patient presents with 1/5 to 3-/5 B LE strength with MMT; 12/12: goal partially met for Left knee/hip; 05/10/2021= 2-/5 bilateal Hip flex; 3+/5 bilateral Knee ext; 06/21/2021= Patient presents with 2-/5 bilateral Hip flex; 3+/5 bilateral knee ext/flex; 2-/5 left ankle DF; 0/5 right ankle- and able to increase reps and resistance with LE's. 09/15/2021- Patient technically presents with 2-/5 B hip flex/abd/add - but he is able to raise his hip up to approx 100 deg which has improved. 3+/5 Bilateral knee ext, 2-/5 left ankle and 0/5 right ankle.   12/08/2021= Patient able to lift left knee at 110 deg of hip flex; presents with 3+/5 knee ext, 2-/5 left ankle DF and 0/5 right ankle DF, 2-/5 bilateral Hip abd in seated position.    12/6: R: knee 3+/5 ext, 2/5 flexion, left knee 3+/5 extension, 3+/5 flexion, R hip: 2+/5 hip add, 2+/5 hip ABD L hip: 4-/5 hip ABD, 3+/5 hip ADD, 3+/5 hip flexion; 06/06/2022= Patient now presents with 2-/5  right ankle DF/PF;     Time 12     Period Weeks     Status MET    Target Date 03/07/2022         Paul [name] LONG TERM GOAL #2    Title Patient will tolerate sitting unsupported demonstrating erect sitting posture for 15 minutes with CGA to demonstrate improved back extensor strength and improved sitting tolerance.     Baseline 01/04/2021- Patient confied to sitting in lift chair or electric power chair with back support and unable to sit upright without physical assistance; 12/12: tolerates <1 minutes upright unsupported sitting. 05/10/2021=static sit with forward trunk lean  in his power wheelchair without back support x approx 3 min. 06/21/2021=Unable to assess today due to patient with acute back pain but on previous visit able to sit x 8 min without back support. 09/15/2021- on last visit- 09/13/2021- patient was able to sit unsupported x 8 min at edge of mat. 10/13/2023 - Patient was able to sit at edge of mat with varying level of assist today from SBA to min A for a total of 20 min. 12/13/2021= Patient demonstrated unsupported sitting at edge of mat for approx 20 min    Time 12     Period Weeks     Status GOAL MET    Target Date 12/08/21          Paul [name] LONG TERM GOAL #3    Title Patient will demonstrate ability to perform static standing in // bars > 2 min with Max Assist  without loss of balance and fair posture for improved overall strength for pre-gait and transfer activities.     Baseline 01/04/2021= Patient current uanble to stand- Dependent on hoyer or sit to stand lift for transfers. 05/10/2021=Not appropriate yet- Currently  still dependent with all transfers using hoyer. 06/21/2021= Patient continuing now to focus on LE strengthening to prepare for standing-unable to try today due to acute low back pain-  planning on attempting in new cert period. 09/15/2021- Patient has attempted standing 2x in past two week- max Assist of 2 people - only once was he successful to clearing his bottom from chair - Will continue to be a focus during the new certification. 12/13/2021= Patient has been limited secondary to increased overall low back pain during this certification and will require more time to focus on this goal.  12/6: not assessed this date, will assess at date when 2-3 PTs are present for assistance     Time 12     Period Weeks     Status GOAL not appropriate at this time - may attempt in future once Patient presents with improved overall LE strength.                 Paul [name] LONG TERM GOAL #4    Title Paul [name] will improve FOTO score by 10 points or more demonstrating improved perceived functional ability     Baseline FOTO 7 on 10/17; 03/15/21: FOTO 12; 05/10/2021 06/21/2021= 1; 09/15/2021= 9; 12/13/2021= Will issue next visit 12/6: 4; 06/06/2022= Will assess next visit; Goal not appropriate as patient is not ambulatory.     Time 12     Period Weeks     Status WITHDRAWN    Target date 11/30/2022         Paul [name] LONG TERM GOAL #5    Title Patient will perform sit to stand transfer with appropriate AD and max assist of 2 people with 75% consistency to prepare for pregait  activities.     Baseline 09/15/2021= Patient unable to stand well- unable to clear his bottom off chair with Max assist of 2 persons. 12/13/2021- Goal not appropriate to try yet but will keep and roll over to next cert as shift continues to focus on transfers/standing; 4/24= Patient able to perform active ankle DF/PF with right LE and able to raise his knee into seated march and clear floor without physical assist today - as previously unable as well as lift right knee ext to near  full ROM to improve strength for eventual standing. 09/07/2022- Goal continues to not be appropriate but patient is now standing some and will keep goal active; 11/23/2022= patient is now performing sit to stand with max A +2 and now able to clear his bottom off mat. 11/30/2022- Patient continues to perform sit to stand transfers with Max A+2 and able to clear bottom well off mat but not erect yet. 05/31/2023- Patient is able to stand consistently with max A +2 - unable to achieve full knee or trunk ext yet is able to clear bottom off mat for improved LE weightbearing. 09/04/2023- Patient performed several Sit to stand requiring max A+2  with improving posture- able to ext R knee better - near terminal knee and able to clear bottom off mat- still not full but able to contract glutes for standing as erect as possible.     Time 12     Period Weeks     Status PROGRESSING    Target date  11/27/2023       Paul [name] LONG TERM GOAL #6  Title Patient will tolerate sitting unsupported demonstrating erect sitting posture for 30 minutes with CGA to demonstrate improved back extensor strength and improved sitting tolerance.   Baseline 12/13/2021= Patient demonstrated unsupported sitting at edge of mat for approx 20 min;  12/29/2021- Patient performed approx 30 min of dynamic sitting activities today. 06/06/2022= Patient demonstrated ability to sit and perform static and dynamic UE/LE movement with only Supervision.   Time 12   Period Weeks   Status GOAL MET           7.  Patient will tolerate 2 minutes or more of standing in sit to stand lift or with max assist +2 in order to indicate improved lower extremity weightbearing tolerance for progression to standing in parallel bars. Baseline: 1 minute on most recent stand 02/21/22; 06/06/2022- Patient did attempt today after complaining of right LE pain last week. He attempted 3 stands using sit to stand lift. - all over 1 min- last one approx 48 sec- stopped due to fatigue. More  erect standing in lift today - still poor gluteal strength but able to activate glutes and extend hips upon command but unable to hold > 5 sec. 07/28/2022- Will attempt standing next visit but patient has been able to stand since last progress note for up to 3 min at a time at his best. 09/07/2022-  attempted standing with max +2 and patient able to stand 15-20 sec so will now  revise goal; 11/23/2022- Patient at his best between last 2 visits was able to stand approx 50 sec with max A+2.  11/30/2022- Patient performed 4 trials of static standing - max A +2- clearing bottom and using forearms to bear weight  03/15/2023: Patient performed 2 trials of static standing - max A +2- clearing edge of mat and using forearms to bear weight for approximately 45 seconds, is able to come into approximately 45 degrees  of knee flexion from sit to stand; 05/31/2023- Patient has not attempted standing in 1 month but able to perform x 4 trials with max A +2 (Paul [name] and his mom) and able to clear his bottom off mat and attempted to activate his glutes and apply some forearm pressure - able to stand between 25-40 sec today. 09/04/2023- Patient performed 3 trials of standing ranging from 17 -47 sec - improving right knee terminal knee ext and more activation upon command with gluteals- able to clear mat table. Continued to require Max A+2 and blocking knees with forearm/trunk support.   Goal status: PROGRESSING Target date: 11/27/2023   8. Patient will tolerate sitting unsupported demonstrating ability to perform dynamic UE/LE activities for 30 minutes  independently to demonstrate ability to sit at edge of bed to eat or perform some ADL's/exercise for optimal quality of life.             Baseline: 06/06/2022- Patient able to sit and perform some UE/LE exercises but requires CGA at times for safety- he is able to static sit for 30 min with supervision. 07/27/2022= Patient able to now sit > 30 min with Supervision only - performing static and  dynamic activities. Will keep goal active to ensure patient is consistent. 09/07/2022=  Patient able to demonstrate consistent ability to sit at edge of mat during visits             Goal Status: MET             Target date: 08/29/2022  9.  Patient will perform rolling left to right with Mod assist for improved independence with bed mobility to assist with dressing and positioning in bed. Baseline: 09/04/2023- Mother reported last visit that he was having some difficulty with assisting with bed mobility. Goal status: NEW Target date: 11/27/2023    10. Patient will perform active bridging -able to clear bottom   Off mat for 10 reps with Paul [name]/caregiver holding his feet    For improved trunk ext strength with standing and to    To assist with be mobility/positioning.  Baseline: 09/04/2023= (Paul [name] assisted- holding right LE In neutral and pressure on each foot) - Difficulty clearing bottom off mat. Goal status: NEW Target date: 11/27/2023           Plan     Clinical Impression Statement Patient presents with upset stomach and requested to stay in his chair. Despite not feeling well he was able to progress with more resistance with LE strengthening today. He continues to move his RLE better against gravity and demonstrated improved hip abd/add swing today.  Paul [name] will continue to benefit from skilled physical therapy intervention to address impairments, improve QOL, and attain therapy goals.    Personal Factors and Comorbidities Comorbidity 3+;Time since onset of injury/illness/exacerbation    Comorbidities CVA, diabetes, Seizures    Examination-Activity Limitations Bathing;Bed Mobility;Bend;Caring for Others;Carry;Dressing;Hygiene/Grooming;Lift;Locomotion Level;Reach Overhead;Self Feeding;Sit;Squat;Stairs;Stand;Transfers;Toileting    Examination-Participation Restrictions Cleaning;Community Activity;Driving;Laundry;Medication Management;Meal Prep;Occupation;Personal Finances;Shop;Yard Work;Volunteer     Stability/Clinical Decision Making Evolving/Moderate complexity    Rehab Potential Fair    Paul [name] Frequency 1-2x / week    Paul [name] Duration 12 weeks    Paul [name] Treatment/Interventions ADLs/Self Care Home Management;Cryotherapy;Electrical Stimulation;Moist Heat;Ultrasound;DME Instruction;Gait training;Stair training;Functional mobility training;Therapeutic exercise;Balance training;Patient/family education;Orthotic Fit/Training;Neuromuscular re-education;Wheelchair mobility training;Manual techniques;Passive range of motion;Dry needling;Energy conservation;Taping;Visual/perceptual remediation/compensation;Joint Manipulations    Paul [name] Next Visit Plan core strength/motor control in short-sitting, sit to stand if able; Continue with progressive LE Strengthening. Stand activities as appropriate.  Nustep and  potential standing in // bars as appropriate.    Paul [name] Home Exercise Plan No changes to HEP today    Consulted and Agree with Plan of Care Patient;Family member/caregiver    Family Member Consulted             03/09/2023, 8:04 AM      1:07 PM, 11/16/23 Paul LOISE Paul [name] Paul [name] Physical Therapist - [facility]  1:07 PM 11/16/23

## 2023-11-20 ENCOUNTER — Ambulatory Visit: Admitting: Occupational Therapy

## 2023-11-20 ENCOUNTER — Ambulatory Visit

## 2023-11-20 DIAGNOSIS — R278 Other lack of coordination: Secondary | ICD-10-CM

## 2023-11-20 DIAGNOSIS — M6281 Muscle weakness (generalized): Secondary | ICD-10-CM

## 2023-11-20 DIAGNOSIS — R2681 Unsteadiness on feet: Secondary | ICD-10-CM

## 2023-11-20 DIAGNOSIS — R262 Difficulty in walking, not elsewhere classified: Secondary | ICD-10-CM | POA: Diagnosis not present

## 2023-11-20 DIAGNOSIS — R269 Unspecified abnormalities of gait and mobility: Secondary | ICD-10-CM

## 2023-11-20 DIAGNOSIS — R2689 Other abnormalities of gait and mobility: Secondary | ICD-10-CM | POA: Diagnosis not present

## 2023-11-20 DIAGNOSIS — I6930 Unspecified sequelae of cerebral infarction: Secondary | ICD-10-CM | POA: Diagnosis not present

## 2023-11-20 DIAGNOSIS — H543 Unqualified visual loss, both eyes: Secondary | ICD-10-CM | POA: Diagnosis not present

## 2023-11-20 NOTE — Therapy (Signed)
 OUTPATIENT PHYSICAL THERAPY TREATMENT   Patient Name: Paul Hall MRN: [id number] DOB:July 03, 1995, 28 y.o., male Today's Date: 11/21/2023  PCP:  [facility] PROVIDER:  Morene Sous, PA-C   PT End of Session - 11/20/23 1536     Visit Number 173    Number of Visits 184   date corrected from most recent recert   Date for PT Re-Evaluation 11/27/23    Authorization Type [employer] COMM [employer]/ [place] Medicaid    Authorization Time Period 01/04/21-03/29/21; Recert 03/24/2021-06/16/2021; Recert 09/15/2021- 12/08/2021; Recert 12/13/2021-03/07/2022    Progress Note Due on Visit 180    PT Start Time 1535    PT Stop Time 1615    PT Time Calculation (min) 40 min    Equipment Utilized During Treatment Gait belt    Activity Tolerance Patient tolerated treatment well    Behavior During Therapy WFL for tasks assessed/performed                               Past Medical History:  Diagnosis Date   Diabetes mellitus (HCC)    Hypertension    Stroke (HCC)    Past Surgical History:  Procedure Laterality Date   IVC FILTER PLACEMENT ([facility] HX)     LEG SURGERY     PEG TUBE PLACEMENT     TRACHEOSTOMY     Patient Active Problem List   Diagnosis Date Noted   Sepsis due to vancomycin  resistant Enterococcus species (HCC) 06/06/2019   SIRS (systemic inflammatory response syndrome) (HCC) 06/05/2019   Acute lower UTI 06/05/2019   VRE (vancomycin -resistant Enterococci) infection 06/05/2019   Anemia 06/05/2019   Skin ulcer of sacrum with necrosis of muscle (HCC)    Urinary retention    Type 2 diabetes mellitus without complication, with long-term current use of insulin  (HCC)    Tachycardia    Lower extremity edema    Acute metabolic encephalopathy    Obstructive sleep apnea    Morbid obesity with BMI of 60.0-69.9, adult (HCC)    Goals of care, counseling/discussion    Palliative care encounter    Sepsis (HCC) 04/27/2019   H/O insulin  dependent diabetes mellitus 04/27/2019   History  of CVA with residual deficit 04/27/2019   Seizure disorder (HCC) 04/27/2019   Decubitus ulcer of sacral region, stage 4 (HCC) 04/27/2019    REFERRING DIAG: Cerebral infarction, unspecified   THERAPY DIAG:  Other lack of coordination  Muscle weakness (generalized)  History of CVA with residual deficit  Difficulty in walking, not elsewhere classified  Unsteadiness on feet  Other abnormalities of gait and mobility  Abnormality of gait and mobility  Rationale for Evaluation and Treatment Rehabilitation  PERTINENT HISTORY: Paul Hall is a 26yoM who presents with severe weakness, quadriparesis, altered sensorium, and visual impairment s/p critical illness and prolonged hospitalization. Pt hospitalized in October 2020 with ARDS 2/2 COVID19 infection. Pt sustained a complex and lengthy hospitalization which included tracheostomy, prolonged sedation, ECMO. In this period pt sustained CVA and SDH. Pt has now been liberated from tracheostomy and G-tube. Pt has since been hospitalized for wound infection and UTI. Pt lives with parents at home, has hospital bed and left chair, hoyer lift transfers, and power WC for mobility needs. Pt needs heavy physical assistance with ADL 2/2 BUE contractures and motor dysfunction   PRECAUTIONS: Fall  SUBJECTIVE:  Patient reports stomach upset and not feeling great today.   PAIN:  Are you having pain? No. But feeling  tight.    TODAY'S TREATMENT:  - 11/20/2023    TA:  Patient was hoyer lifted from power w/c to mat table and positioned into supine for today's training:  Bed mobility instruction:  Patient performed rolling from supine- initially to his left side with mother present and assisting with cues and able to perform 5 times with Min A. Patient was able to cross his right leg over left- then turn his head to left- then lift his right arm and minimally reach toward left. Then he was able to roll well to left with min assist.  He was provided  quick rest while explaining how to perform toward affected right side.  He was slightly more apprehensive and upon 1st trial complained of some dizziness. So had patient perform with eyes closed for remainder of trials.  He perform rolling toward affected right side- able to clear left LE over right  with increased effort- able to turn head to right and lift LE - yet unable to roll - attempted to bring left leg back to left side to allow him to possible push more but unsuccessful and patient preferred to cross his leg over- required max assist x 5 trial. He was really uncomfortable laying on his right side but able to continue. On last trial utilized a bed sheet under hips/torso and then able to pull him over to right side more effectively and demonstrated this technique with patient mother.            PATIENT EDUCATION: Education details: Exercise technique with VC   HOME EXERCISE PROGRAM:  Access Code: [email or web address] URL: [email or web address] Date: 03/23/2022 Prepared by: Paul Hall  Exercises - Supine Bridge  - 3 x weekly - 3 sets - 10 reps - 2 hold - Supine Gluteal Sets  - 3 x weekly - 3 sets - 10 reps - 5 sec hold - Supine Quad Set  - 1 x daily - 3 x weekly - 3 sets - 10 reps - 5 hold - Seated Long Arc Quad  - 1 x daily - 7 x weekly - 3 sets - 10 reps - Seated Hip Adduction Squeeze with Ball  - 1 x daily - 3 x weekly - 3 sets - 10 reps - 5 hold - Seated Hip Abduction  - 1 x daily - 3 x weekly - 3 sets - 10 reps - 2 hold    PT Short Term Goals -       PT SHORT TERM GOAL #1   Title Pt will be independent with HEP in order to improve strength and balance in order to decrease fall risk and improve function at home and work.    Baseline 01/04/2021= No formal HEP in place; 12/12 no HEP in place; 05/10/2021-Patient and his father were able to report compliance with curent HEP consisting of mostly seated/reclined LE strengthening. Both verbalize no questions at this  time.    Time 6    Period Weeks    Status Achieved    Target Date 02/15/21            PT LONG TERM GOAL #1     Title Patient will increase BLE gross strength by 1/2 muscle grade to improve functional strength for improved independence with potential gait, increased standing tolerance and increased ADL ability.     Baseline 01/04/2021- Patient presents with 1/5 to 3-/5 B LE strength with MMT; 12/12: goal partially met for Left knee/hip; 05/10/2021= 2-/5 bilateal Hip flex; 3+/5 bilateral  Knee ext; 06/21/2021= Patient presents with 2-/5 bilateral Hip flex; 3+/5 bilateral knee ext/flex; 2-/5 left ankle DF; 0/5 right ankle- and able to increase reps and resistance with LE's. 09/15/2021- Patient technically presents with 2-/5 B hip flex/abd/add - but he is able to raise his hip up to approx 100 deg which has improved. 3+/5 Bilateral knee ext, 2-/5 left ankle and 0/5 right ankle.  12/08/2021= Patient able to lift left knee at 110 deg of hip flex; presents with 3+/5 knee ext, 2-/5 left ankle DF and 0/5 right ankle DF, 2-/5 bilateral Hip abd in seated position.    12/6: R: knee 3+/5 ext, 2/5 flexion, left knee 3+/5 extension, 3+/5 flexion, R hip: 2+/5 hip add, 2+/5 hip ABD L hip: 4-/5 hip ABD, 3+/5 hip ADD, 3+/5 hip flexion; 06/06/2022= Patient now presents with 2-/5 right ankle DF/PF;     Time 12     Period Weeks     Status MET    Target Date 03/07/2022         PT LONG TERM GOAL #2    Title Patient will tolerate sitting unsupported demonstrating erect sitting posture for 15 minutes with CGA to demonstrate improved back extensor strength and improved sitting tolerance.     Baseline 01/04/2021- Patient confied to sitting in lift chair or electric power chair with back support and unable to sit upright without physical assistance; 12/12: tolerates <1 minutes upright unsupported sitting. 05/10/2021=static sit with forward trunk lean  in his power wheelchair without back support x approx 3 min. 06/21/2021=Unable to  assess today due to patient with acute back pain but on previous visit able to sit x 8 min without back support. 09/15/2021- on last visit- 09/13/2021- patient was able to sit unsupported x 8 min at edge of mat. 10/13/2023 - Patient was able to sit at edge of mat with varying level of assist today from SBA to min A for a total of 20 min. 12/13/2021= Patient demonstrated unsupported sitting at edge of mat for approx 20 min    Time 12     Period Weeks     Status GOAL MET    Target Date 12/08/21          PT LONG TERM GOAL #3    Title Patient will demonstrate ability to perform static standing in // bars > 2 min with Max Assist  without loss of balance and fair posture for improved overall strength for pre-gait and transfer activities.     Baseline 01/04/2021= Patient current uanble to stand- Dependent on hoyer or sit to stand lift for transfers. 05/10/2021=Not appropriate yet- Currently still dependent with all transfers using hoyer. 06/21/2021= Patient continuing now to focus on LE strengthening to prepare for standing-unable to try today due to acute low back pain-  planning on attempting in new cert period. 09/15/2021- Patient has attempted standing 2x in past two week- max Assist of 2 people - only once was he successful to clearing his bottom from chair - Will continue to be a focus during the new certification. 12/13/2021= Patient has been limited secondary to increased overall low back pain during this certification and will require more time to focus on this goal.  12/6: not assessed this date, will assess at date when 2-3 PTs are present for assistance     Time 12     Period Weeks     Status GOAL not appropriate at this time - may attempt in future once Patient presents with improved overall  LE strength.                 PT LONG TERM GOAL #4    Title Pt will improve FOTO score by 10 points or more demonstrating improved perceived functional ability     Baseline FOTO 7 on 10/17; 03/15/21: FOTO 12;  05/10/2021 06/21/2021= 1; 09/15/2021= 9; 12/13/2021= Will issue next visit 12/6: 4; 06/06/2022= Will assess next visit; Goal not appropriate as patient is not ambulatory.     Time 12     Period Weeks     Status WITHDRAWN    Target date 11/30/2022         PT LONG TERM GOAL #5    Title Patient will perform sit to stand transfer with appropriate AD and max assist of 2 people with 75% consistency to prepare for pregait activities.     Baseline 09/15/2021= Patient unable to stand well- unable to clear his bottom off chair with Max assist of 2 persons. 12/13/2021- Goal not appropriate to try yet but will keep and roll over to next cert as shift continues to focus on transfers/standing; 4/24= Patient able to perform active ankle DF/PF with right LE and able to raise his knee into seated march and clear floor without physical assist today - as previously unable as well as lift right knee ext to near full ROM to improve strength for eventual standing. 09/07/2022- Goal continues to not be appropriate but patient is now standing some and will keep goal active; 11/23/2022= patient is now performing sit to stand with max A +2 and now able to clear his bottom off mat. 11/30/2022- Patient continues to perform sit to stand transfers with Max A+2 and able to clear bottom well off mat but not erect yet. 05/31/2023- Patient is able to stand consistently with max A +2 - unable to achieve full knee or trunk ext yet is able to clear bottom off mat for improved LE weightbearing. 09/04/2023- Patient performed several Sit to stand requiring max A+2  with improving posture- able to ext R knee better - near terminal knee and able to clear bottom off mat- still not full but able to contract glutes for standing as erect as possible.     Time 12     Period Weeks     Status PROGRESSING    Target date  11/27/2023       PT LONG TERM GOAL #6  Title Patient will tolerate sitting unsupported demonstrating erect sitting posture for 30 minutes with CGA to  demonstrate improved back extensor strength and improved sitting tolerance.   Baseline 12/13/2021= Patient demonstrated unsupported sitting at edge of mat for approx 20 min;  12/29/2021- Patient performed approx 30 min of dynamic sitting activities today. 06/06/2022= Patient demonstrated ability to sit and perform static and dynamic UE/LE movement with only Supervision.   Time 12   Period Weeks   Status GOAL MET           7.  Patient will tolerate 2 minutes or more of standing in sit to stand lift or with max assist +2 in order to indicate improved lower extremity weightbearing tolerance for progression to standing in parallel bars. Baseline: 1 minute on most recent stand 02/21/22; 06/06/2022- Patient did attempt today after complaining of right LE pain last week. He attempted 3 stands using sit to stand lift. - all over 1 min- last one approx 48 sec- stopped due to fatigue. More erect standing in lift today - still  poor gluteal strength but able to activate glutes and extend hips upon command but unable to hold > 5 sec. 07/28/2022- Will attempt standing next visit but patient has been able to stand since last progress note for up to 3 min at a time at his best. 09/07/2022-  attempted standing with max +2 and patient able to stand 15-20 sec so will now  revise goal; 11/23/2022- Patient at his best between last 2 visits was able to stand approx 50 sec with max A+2.  11/30/2022- Patient performed 4 trials of static standing - max A +2- clearing bottom and using forearms to bear weight  03/15/2023: Patient performed 2 trials of static standing - max A +2- clearing edge of mat and using forearms to bear weight for approximately 45 seconds, is able to come into approximately 45 degrees of knee flexion from sit to stand; 05/31/2023- Patient has not attempted standing in 1 month but able to perform x 4 trials with max A +2 (PT and his mom) and able to clear his bottom off mat and attempted to activate his glutes and  apply some forearm pressure - able to stand between 25-40 sec today. 09/04/2023- Patient performed 3 trials of standing ranging from 17 -47 sec - improving right knee terminal knee ext and more activation upon command with gluteals- able to clear mat table. Continued to require Max A+2 and blocking knees with forearm/trunk support.   Goal status: PROGRESSING Target date: 11/27/2023   8. Patient will tolerate sitting unsupported demonstrating ability to perform dynamic UE/LE activities for 30 minutes  independently to demonstrate ability to sit at edge of bed to eat or perform some ADL's/exercise for optimal quality of life.             Baseline: 06/06/2022- Patient able to sit and perform some UE/LE exercises but requires CGA at times for safety- he is able to static sit for 30 min with supervision. 07/27/2022= Patient able to now sit > 30 min with Supervision only - performing static and dynamic activities. Will keep goal active to ensure patient is consistent. 09/07/2022=  Patient able to demonstrate consistent ability to sit at edge of mat during visits             Goal Status: MET             Target date: 08/29/2022  9.  Patient will perform rolling left to right with Mod assist for improved independence with bed mobility to assist with dressing and positioning in bed. Baseline: 09/04/2023- Mother reported last visit that he was having some difficulty with assisting with bed mobility. Goal status: NEW Target date: 11/27/2023    10. Patient will perform active bridging -able to clear bottom   Off mat for 10 reps with PT/caregiver holding his feet    For improved trunk ext strength with standing and to    To assist with be mobility/positioning.  Baseline: 09/04/2023= (PT assisted- holding right LE In neutral and pressure on each foot) - Difficulty clearing bottom off mat. Goal status: NEW Target date: 11/27/2023           Plan     Clinical Impression Statement Treatment focused on bed mobility and  patient participated well and performed with great effort today. He continues to use his Right LE better and with less assist. He was able to transfer to left without much difficulty and little physical assist. Yet experienced much difficulty transferring to right side. He was able to  follow the steps today yet just difficult with actual rolling and will benefit from further training. Pt will continue to benefit from skilled physical therapy intervention to address impairments, improve QOL, and attain therapy goals.    Personal Factors and Comorbidities Comorbidity 3+;Time since onset of injury/illness/exacerbation    Comorbidities CVA, diabetes, Seizures    Examination-Activity Limitations Bathing;Bed Mobility;Bend;Caring for Others;Carry;Dressing;Hygiene/Grooming;Lift;Locomotion Level;Reach Overhead;Self Feeding;Sit;Squat;Stairs;Stand;Transfers;Toileting    Examination-Participation Restrictions Cleaning;Community Activity;Driving;Laundry;Medication Management;Meal Prep;Occupation;Personal Finances;Shop;Yard Work;Volunteer    Stability/Clinical Decision Making Evolving/Moderate complexity    Rehab Potential Fair    PT Frequency 1-2x / week    PT Duration 12 weeks    PT Treatment/Interventions ADLs/Self Care Home Management;Cryotherapy;Electrical Stimulation;Moist Heat;Ultrasound;DME Instruction;Gait training;Stair training;Functional mobility training;Therapeutic exercise;Balance training;Patient/family education;Orthotic Fit/Training;Neuromuscular re-education;Wheelchair mobility training;Manual techniques;Passive range of motion;Dry needling;Energy conservation;Taping;Visual/perceptual remediation/compensation;Joint Manipulations    PT Next Visit Plan core strength/motor control in short-sitting, sit to stand if able; Continue with progressive LE Strengthening. Stand activities as appropriate.  Nustep and potential standing in // bars as appropriate.    PT Home Exercise Plan No changes to HEP today     Consulted and Agree with Plan of Care Patient;Family member/caregiver    Family Member Consulted             03/09/2023, 8:04 AM      9:48 AM, 11/21/23 Paul LOISE Hall PT Physical Therapist - [facility]  9:48 AM 11/21/23

## 2023-11-20 NOTE — Therapy (Signed)
 . Occupational Therapy Treatment Note    Patient Name: Paul Hall MRN: [id number] DOB:11/20/1995, 28 y.o., male Today's Date: 11/20/2023  PCP: Dr. Bertell REFERRING PROVIDER: Dr. Bertell   .   OT End of Session - 11/20/23 2257     Visit Number [id number]    Number of Visits 180    Date for OT Re-Evaluation 01/16/24    OT Start Time 1420    OT Stop Time 1500    OT Time Calculation (min) 40 min    Equipment Utilized During Treatment tilt in space power wc    Activity Tolerance Patient tolerated treatment well    Behavior During Therapy WFL for tasks assessed/performed                 Past Medical History:  Diagnosis Date   Diabetes mellitus (HCC)    Hypertension    Stroke [facility])    Past Surgical History:  Procedure Laterality Date   IVC FILTER PLACEMENT ([facility] HX)     LEG SURGERY     PEG TUBE PLACEMENT     TRACHEOSTOMY     Patient Active Problem List   Diagnosis Date Noted   Sepsis due to vancomycin  resistant Enterococcus species (HCC) 06/06/2019   SIRS (systemic inflammatory response syndrome) (HCC) 06/05/2019   Acute lower UTI 06/05/2019   VRE (vancomycin -resistant Enterococci) infection 06/05/2019   Anemia 06/05/2019   Skin ulcer of sacrum with necrosis of muscle (HCC)    Urinary retention    Type 2 diabetes mellitus without complication, with long-term current use of insulin  (HCC)    Tachycardia    Lower extremity edema    Acute metabolic encephalopathy    Obstructive sleep apnea    Morbid obesity with BMI of 60.0-69.9, adult (HCC)    Goals of care, counseling/discussion    Palliative care encounter    Sepsis (HCC) 04/27/2019   H/O insulin  dependent diabetes mellitus 04/27/2019   History of CVA with residual deficit 04/27/2019   Seizure disorder (HCC) 04/27/2019   Decubitus ulcer of sacral region, stage 4 (HCC) 04/27/2019   ONSET DATE: 01/2019  REFERRING DIAG: CVA/COVID-19  THERAPY DIAG:  Muscle weakness (generalized)  Rationale for  Evaluation and Treatment Rehabilitation  SUBJECTIVE:  Pt.Caregiver reports that pt. has been working on Development worker, international aid stems to assist with making a floral wreath.  SUBJECTIVE STATEMENT:  Pt.  Reports feeling better today.  Pt accompanied by: self and family member  PERTINENT HISTORY:  Pt. is a 28 y.o. male who was diagnosed with COVID-19, and CVA with resultant quadriplegia. Pt. Was then hospitalized with VRE UTI. PMHx includes: urinary retention, seizure disorder, obstructive sleep disorder, DM Type II, Morbid obesity.   PRECAUTIONS: None  WEIGHT BEARING RESTRICTIONS No  PAIN:  No reports of pain Are you having pain? 1-2/10 pain in L shoulder  FALLS: Has patient fallen in last 6 months? No  LIVING ENVIRONMENT: Lives with: lives with their family Lives in: House/apartment Stairs: No Level Entry Has following equipment at home: Wheelchair (power) and hoyer lift, sit to stand lift  PLOF: Independent  PATIENT GOALS: To be able to engage in more daily care tasks.  OBJECTIVE:   HAND DOMINANCE: Right  ADLs:  Transfers/ambulation related to ADLs: Eating: Pt. reports being able to hold standard utensils, and is starting to engage more in self-feeding tasks, hand to mouth patterns. Pt. Reports that he does as much as he can with the task, and family assists  with the remainder of the  task. Grooming: Pt. is able to initiate holding an electric toothbrush, and brush his teeth. Family assists LB Dressing: Total Assist UE dressing: Pt. is now able to reach up to actively assist with grasping , and pulling his gown down. Toileting:  Total Assist Bathing: MaxA UB, Total assist LB Tub Shower transfers: N/A Equipment: See above    IADLs: Shopping: Relies on family to assist Light housekeeping: Total Assist Meal Prep: Total Assist Community mobility:   Medication management:  Total Assist  Financial management: N/A Handwriting: Not legible: Pt. Is able to hold a pen with the  left hand, and initiate marking the page. Pt.'s eye glasses were not available  MOBILITY STATUS:  Power w/c  POSTURE COMMENTS:  Pt. Requires position changes in his power w/c  ACTIVITY TOLERANCE: Activity tolerance:  Fair  FUNCTIONAL OUTCOME MEASURES: FOTO: 40 TR score: 45  05/25/2022:   FOTO: 50 TR score: 45  07/20/22: FOTO 55  03/08/23: 51  UPPER EXTREMITY ROM     Active ROM Left eval Left  03/21/2022 Left 05/25/2022 L  07/20/22 Left 07/25/2022 Left  08/15/2022 Left  09/26/2022 Left  11/16/2022 Left 12/28/2022 Left 03/13/23 Left 05/10/23 Left 10/23/23  Shoulder flexion 119 123 125 130  136 138 130 142 144 144 148  Shoulder abduction 110 115 115 115  118 121 125 142 140 140 140  Shoulder adduction              Shoulder extension              Shoulder internal rotation              Shoulder external rotation              Elbow flexion 135(135) 145 145 145  145    145 145   Elbow extension -27(-18) -21(-20) -20(-16)  -25(-10) -23(-10) -21(-10) -20(-18) -20(-18) -20(-17) -17(-16) -12(-8)  Wrist flexion              Wrist extension 10(50) 19(50) 28(50)  30(50) 35(55) 36(55) 36(60) 40(60) 45(60) 45(60) 42(54)  Wrist ulnar deviation              Wrist radial deviation              Wrist pronation            60(74)  Wrist supination   Limited by flexor tone       10(15)  30(38)  (Blank rows = not tested)    Active ROM Right eval Right 03/21/2022 Right 05/25/2022 R  07/20/22 Right  07/25/22 Right 08/15/2022 Right 09/26/2022 Right 11/16/2022 Right 12/28/2022 Right 03/13/23 Right 05/10/23 Right 10/23/23  Shoulder flexion 106 scaption 105  Scaption 62 flexion 110 scaption 100  105 105 105 112 Scaption 123 Scaption 122  Scaption 142 Scaption  Shoulder abduction 114 90 97 100  105 117 120 124 124 122 144  Shoulder adduction              Shoulder extension              Shoulder internal rotation              Shoulder external rotation              Elbow flexion 120(130) 145  145 140  144 140 142 148 148 145   Elbow extension -45(-35) -22(-35) -28(-22)  -32(-21) -32(-28) -28(-26) -28(-28) -27(-26) -27(-40) -38(-35) -34(-26)  Wrist flexion  Wrist extension -30(10) -25(10) -10(30)  -10(20) -10(25) -20(40) -30(10) -22(34) -22(35) -32(22) -30(-8)  Wrist ulnar deviation              Wrist radial deviation              Wrist pronation              Wrist supination   Limited by flexor tone                12/28/2022: Thumb radial abduction: Right: 12(46), Left: 40(54)  Digits: Bilateral 2nd through 5th digit: MP Hyperextension with PIP, an DIP flexion    10/23/23:  Bilateral 2nd through 5th digit MP hyperextension with PIP, and DIP flexion: Pt. has been intermittently able to initate active diigt PIP, and DIP extension following Botox during this past recertification/progress reporting period.     UPPER EXTREMITY MMT:     Left eval Left 03/21/2022 Left  05/25/2022 L 07/20/22 Left  08/15/2022 Left 09/26/2022 Left 11/16/2022 Left 12/28/2022 Left  03/13/2023 Left  05/10/23 Left 10/23/23  Shoulder flexion 3-/5 3/5 3+/5 4+/5 4+/5 4+/5 4+/5 4+/5 4+/5 4+/5 4+/5  Shoulder abduction 3-/5 3-/5 4-/5 4+/5 4+/5 4+5 4+/5 4+/5 4+/5 4+/5 4+/5  Shoulder adduction             Shoulder extension             Shoulder internal rotation             Shoulder external rotation             Elbow flexion 3/5 3+/5 4-/5 4+/5 5/5 5/5 5/5 5/5 5/5 5/5 5/5  Elbow extension 2-/5 2/5  4+/5 4+/5 4+/5  4/5 4/5 4/5 through available range 4+/5 through available range  Wrist flexion             Wrist extension 2/5 2+/5 3-/5  3-/5 3-/5 3-/5 3/5 3/5 3/5 3+/5  Wrist ulnar deviation             Wrist radial deviation             Wrist pronation             Wrist supination             (Blank rows = not tested)  Right eval Right 03/21/2022 Right  05/25/2022 R 07/20/22 Right 08/15/2022 Right 09/26/2022 Right 11/16/2022 Right 12/28/2022 Right 03/13/2023 Right 05/10/23 Right 10/23/23   Shoulder flexion 3-/5 scaption 3-/5 3-/5 3+/5 3+/5 3+/5 3/5 3/5 3/5 3+/5 4-/5  Shoulder abduction 3-/5 3-/5 3-/5 4/5 4/5 4/5 4/5 4/5 4/5 4/-5 4-/5  Shoulder adduction             Shoulder extension             Shoulder internal rotation             Shoulder external rotation             Elbow flexion 3/5 3/5 3+/5 4/5 4+/5 4+/5 4+/5 4+/5 4+/5 4+/5 4+/5  Elbow extension 2-/5 2/5 3-/5 4+/5 4+/5 4+/5 4+/5 4-/5 4-/5 4-/5 4-/5 through available range  Wrist flexion             Wrist extension 2-/5 2-/5 2/5 2/5 2/5 2/5 2/5 2/5 2/5 2/5 2/5  Wrist ulnar deviation             Wrist radial deviation             Wrist pronation  Wrist supination               HAND FUNCTION:  Grip strength: Right: 0 lbs; Left: 0 lbs and Lateral pinch: Right: 5 lbs, Left: 2 lbs  03/21/2022: Lateral pinch: Right: 3.5 lbs, Left: 2 lbs  07/20/22: Grip strength: Right: 3 lbs; Left: 4 lbs and Lateral pinch: Right: 5 lbs, Left: 3 lbs  08/15/2022:  Grip strength: Right: 0 lbs; Left: 5 lbs and Lateral pinch: Right: 2 lbs, Left: 4 lbs  09/26/2022  Grip strength: Right: 0 lbs; Left: 8 lbs and Lateral pinch: Right: 2 lbs, Left: 5 lbs  11/16/2022  Grip strength: Right: 0 lbs; Left: 8 lbs  9/25/20204  Grip strength: Right: 0 lbs; Left: 8 lbs  Grip strength: Right: 0 lbs; Left: 8 lbs   03/08/23:  Grip strength: Right: 0 lbs without PROM, 5 lbs after PROM, Left 8 lbs  05/10/23:  Grip strength: TBD  Bilateral digit PIP/DIP flexion contractures with MP hyperextension with attempts for AROM. Pt. is able to tolerate AROM to the bilateral digits at the initial evaluation however, has a history of pain in the digits.  COORDINATION: Eval: Pt. is unable to grasp 9-hole test pegs. Pt. is able to initiate grasping larger pegs, and is able to hold a pen in the left hand.  07/20/22: 2 min 36 seconds to remove 9 pegs from 9 hole peg test - cues to locate pegs 2/2 low vision. Pt. is able to initiate grasping  larger pegs on R hand and is able to hold a pen in the left hand.  08/15/2022:    Vision, and sensation limiting accuracy of 9 hole peg test results. Pt. Was able to grasp and remove vertical pegs intermittently with cues.    05/10/23: TBD  SENSATION: Light touch: Impaired   EDEMA:  N/A  MUSCLE TONE: BUE flexor Spasticity  COGNITION Overall cognitive status: Continue to assess in functional context  VISION:   Subjective report: Pt. was not wearing glasses at the time of the initial eval.  Baseline vision: Vision is very limited. Wears glasses all the time Visual history: History of impaired vision following CVA. Pt. Has received treatment through the [facility] low vision rehabilitation program.   VISION ASSESSMENT: Impaired To be further assessed in functional context  PERCEPTION: Impaired   PRAXIS: Impaired: motor planning  OBSERVATIONS:  Pt reports being on Tramadol   TODAY'S TREATMENT 10/30/23   Therapeutic Ex.:  -BUE PROM was performed with slow prolonged gentle stretching of bilateral hands including wrist/digit flexion/extension including Mps, PIPs, DIPs of all digits, and wrist flexion/extension, and forearm supination 2/2 flexor tine, and tightness  Therapeutic Activities:   -facilitated bilateral [facility] skills grasping 1.5 smooth flat discs from the therapist's hand with right hand using a lateral pinch grasp, bringing it to midline, and transferring it to the left hand, then transferring it back to the right hand from the left hand. Pt. Required tapering support at the right wrist.       PATIENT EDUCATION: Education details: Incorporating Active Right hand supination during self-feeding tasks, grasping and releasing utensils, grasping snack foods from plate. Person educated: Patient and Parent Education method: Explanation Education comprehension: verbalized understanding  HOME EXERCISE PROGRAM:    Incorporating  the right hand during self-feeding tasks.    GOALS: Goals reviewed with patient? Yes  SHORT TERM GOALS: Target date: 12/05/2023   To assess splint fit, and make appropriate adjustments to promote good skin integrity through the palmar surface of  the bilateral hands.  Baseline: 05/25/22: Goal currently met, however ongoing as needs to assess splint fit arise. 03/23/2022: Pt. is wearing splints a couple of hours at night bilateral resting hand splints. 03/21/2022: Pt. is wearing splints a couple of hours at night bilateral resting hand splints. Goal status: Deferred   LONG TERM GOALS: Target date: 01/15/2024   FOTO score will Improve by 2 points for Pt. perceived improvement with the assessment specific ADL/IADL tasks.  Baseline: 03/08/23: 51; 12/28/2022: FOTO score: 56; 11/16/2022: 52 09/27/2022: 51 07/20/2022: FOTO 55 05/25/2022: FOTO score: 50,  TR score: 45 Eval: FOTO score: 40, TR score: 45 Goal status: Achieved   2.   Pt. will independently perform oral care for 100% of the task after complete set-up. Baseline: 05/10/23: Pt. Performs 90% of the task. Pt.'s mother assists with 10% of the task for thorough cleaning the hard to reach places way in the back of the oral cavity.03/08/23: not as much assist required proximally, but to adjust positioning of toothbrush; 02/13/23: Pt. Continues to require assist proximally 01/09/2023: Continue 12/28/2022:  Pt. Continues to complete 90% of the task with complete set-up, with visual cues, and occassional assist of the right elbow. 11/16/2022: Pt. Completes 90% of the task with complete set-up. 09/26/2022: Completes 90% of the task, proximal assist at times required at the elbow.  08/15/2022: Completes 90% of the task, proximal assist at times required at the elbow. 07/20/22: completes 90% of task, limited by shoulder flexion. 05/25/2022:  Pt. Is able to initiate and perform oral care for approximately 90% of the task. Complete set-up required. Assi needed only for the very back teeth. 03/23/2022: Pt. Is able to  initiate and perform oral care for approximately 75% of the task. Pt. Requires assist at proximally at the elbow for through oral care. 03/21/2022: Pt. Is able to initiate and perform oral care for approximately 75% of the task. Pt. Requires assist at proximally at the elbow for through oral care. Eval: Pt. is able to initiate using an electric toothbrush. Pt. requires assist for set-up, and assist for thoroughness, and as he Pt. fatigues. Goal status:  Partially met, Goal discontinued  3.  Pt. Will be modified independence with self-feeding for 100% using a swivel spoon, and standard fork after complete set-up Baseline: 05/10/2023: Pt. feeds self 95-100% of the meal. with occasional assist to reposition the utensil. 03/08/23: indep with cup with a handle and lid, assist to reposition eating utensils in hand;  02/13/23:  Pt. Is now able to consistently, and efficiently use a swivel spoon for eating cereal with the left hand.01/09/2023: Continue 12/28/2022: Pt. Is now feeding himself 90% of the time using a swivel spoon with the left hand, and 95% of the time with the fork. Pt. Continues to have difficulty with cutting food. 11/16/2022: 100% with finger foods, Pt. Is initiating with a swivel spoon, and universal cuff for fork use, however requires assist for consistency, and accuracy.  09/26/2022: 90% using the spoon, assist at time with the forearm motion, and grip on the utensils. 100% for finger foods.  08/15/2022:  90% using the spoon, assit at time with the forearm motion, and grip on the utensils. 100% for finger foods-independent with set-up- unsupervised.  07/20/22: self-feeds cereal using spoon 90% of task. 05/25/2022: Pt. Is able to use a spoon to scoop cereal when feeding himself cereal 85% of the time. Pt. Is able to feed himself snack/finger foods 100% of the time. Pt. Continues to work on consistency  of  stabilizing a cup/mug when drinking. Pt. Is able to grasp a water  bottle with assist initially, with  assist tapering off as he drinks.03/23/2022: Pt. Is able to perform scooping cereal for 75% of the time. Pt. required assist, and support at the left elbow, and Pt. Presents with limited forearm supination when using the spoon, and bringing it towards his mouth. Pt. Is able to use a fork to spear items, and perform the hand to mouth pattern.  03/21/2022: Pt. Is able to perform scooping cereal for 75% of the time. Pt. required assist, and support at the left elbow, and Pt. presents with limited forearm supination when using the spoon, and bringing it towards his mouth. Pt. Is able to use a fork to spear items, and perform the hand to mouth pattern.  Eval: Pt. is able to hold standard standard utensils. Pt. Performs as much of the task as he, can and has assistance for the remainder. Goal status:  Improved, Achieved  4.  Pt. will improve grasp patterns and consistently grasp 1/4 objects for ADL, and IADL tasks.  Baseline: 10/23/2023: Pt. is able to initiate grasping small objects visual cues, and increased tim, however continues to work towards grasping 1/4 objects efficiently. 08/27/23: Pt. is able to grasp flat discs, and sustain a secure hold on them while moving the UE through various planes. 08/07/23: Continue 05/31/2023: Continue 05/10/23: Pt. is consistently able to grasp 1/2 objects with visual cues. Pt. Requires assist, and increased visual cues to grasp 1/4 objects on an elevated plane.  Pt. Is unable to grasp the flat objects at the tabletop surface. Pt. Is grasping grasping small puppy vitamins.03/08/23: 1/2 objects efficiently; 02/13/2023: 1/2 objects efficiently 01/09/2023: Continue 12/28/2022: Continues to grasp 1/4 pegs with min a + visual cues, consistently grasping 1/2 objects with visual cues 11/16/2022: Continues to grasp 1/4 pegs with min a + visual cues, consistently grasping 1/2 objects with visual cues 09/26/2022: Continues to grasp 1/4 pegs with min a + visual cues, consistently grasping  1/2 objects with visual cues. 08/15/2022: grasps 1/4 pegs with min a + visual cues, consistently grasping 1/2 objects with visual cues. 07/20/22: grasps 1/4 pegs with min a + visual cues, consistently grasping 1/2 objects with visual cues. 05/25/2022: Pt. Is working on improving consistency of grasping 1/2 objects with visual cues.  03/23/2022: Pt. Is able to grasp 1 objects consistently,and continues to work on the hand patterns needed to grasp 1/2 objects.03/21/2022: Pt. Is able to grasp 1 objects consistently,and continues to work on the hand patterns needed to grasp 1/2 objects. Eval: Pt. is able to grasp 1 objects intermittently using a lateral grasp pattern. Goal status: Ongoing  5.  Pt. will independently write his name legibly with letter sizes under 1. Baseline: 05/10/23: Deferred 03/08/23: Not addressed this period; 02/13/2023: Continue 01/09/2023: Continue. 12/28/2022: Pt. is now using an IPad Pen. 11/16/2022: Pt. Continues to be able to write name with smaller letter size. Pt. Is signing name with a computer styus. Pt. Requires visual cues, and assist 09/26/2022: Pt. Continues to be able to write name with smaller letter size. Pt. Is signing name with a computer styus. Pt. Requires visual cues, and assist. 08/15/2022: Pt. Is able to write name with smaller letter size. Pt. Is signing name with a computer styus. Pt. Requires visual cues, and assist. 07/20/22: stabilizing assist to write name with 75% legibility with 2 letters. 05/25/2022: Pt. to continue to work towards formulating grasp patterns in preparation for grasping a  large width pen. Pt. Requires visual cues. 03/23/2022: Pt. Is able to write his name with modA, however has difficulty with formulating letter sizes less than 2 in size with 50% legibility for the 3 letters of his name.03/21/2022: Pt. Is able to write his name with modA, however has difficulty with formulating letter sizes less than 2 in size with 50% legibility for the 3  letters of his name. Eval: Pt is able to hold a thin marker with his left hand, and formulate a line, and initiate a circular pattern (Pt. without glasses today) Goal status: Deferred  6. Pt. Will reach up to comb/brush his hair with minA.  Baseline: 05/10/23: Pt. is able to use a pick independently with the left hand with occasional assist to the far side of the left his head. 03/08/23: Pt uses pick in L hand to reach 75% of his head; unable to reach R side of head; 02/13/2023:Pt. is able to use a hair pick with the left hand on the right side of his head, top, and back of the head. Pt. Continues to have difficulty reaching further to the right side of his head with the left. 01/09/2023: Continue 12/28/2022: Pt. Is progressing, and is now able to use a hair pick with the left hand on the right side of his head, top, and back of the head. Pt. Continues to have difficulty reaching further to the right side of his head with the left. 11/16/2022: Pt. Is able to use a hair pick for the left side of his head using his left hand. Pt. Presents with difficulty reaching to the right side of his head.09/26/2022: Pt. Is able to use a hair pick for the left side of his head using his left hand. Pt. Presents with difficulty reaching to the right side of his head. 5/13/202: 75% using a hair pick, assist to the right side of the head. 07/20/22: reaches 75% of head, assist for far R side of head, fatigues quickly. 05/25/2022: Pt. Is able to reach up with the left hand to the left side, top, and back of his head. 03/23/2022: Pt. is now able to more consistently initiate reaching up to his head with his left hand in preparation for haircare12/18/2023: Pt. is now able to more consistently initiate reaching up to his head with his left hand in preparation for haircare. Eval: Pt. is able to initiate reaching up for hair care with a long handled brush, however is unable to sustain UE's in elevation to perform the task.     Goal status:  Achieved  7. Pt. Will independently navigate the w/c through his environment with minA with visual scanning, and hand placement on the controls.  Baseline: 11/16/2022: Deferred 2/2 vision 09/26/2022: Pt. Is now navigating his w/c ouside the home, and around his block. 08/15/2022: Pt. Requires minA to setup hand on controls and MIN cues to navigate the w/c in wide spaces, requires MIN - MOD cues to navigate the w/c through more narrow doorways, and tighter turns - varies based on good vs bad vision days.07/20/22: Pt. Requires minA to setup hand on controls and MIN cues to navigate the w/c in wide spaces, requires MIN - MOD cues to navigate the w/c through more narrow doorways, and tighter turns - varies based on good vs bad vision days. 05/25/2022: Pt. Requires minA  and  cues to navigate the w/c in wide spaces, and requires Mod cues to navigate the w/c through more narrow doorways, and tighter turns.  Pt. Requires max cues for scanning through the environment, and moderate cues for hand placement on the controls.     Goal status: Deferred 2/2 vision    8. Pt. Will improve bilateral grip strength to be able to independently grasp, and pull up blankets, and linens.   Baseline: 05/10/23: Deferred 03/08/23: R grip 0-5 lbs, L grip 8 lbs; pt reports he can consistently pull up blankets and linens independently.  02/13/2023: Continue. Pt. presents with digit PIP, and DIP flexor tightness. 01/09/2023: Continue 12/28/2023: R: 0#, L: 5# Pt. Is now able to more consistently grasp and pull blankets up over him. 11/16/2022: R: 0 L: 8# Pt. Is more consistently grasping his blankets and attempting to pull them up. 09/26/2022: R: 0  L: 8# Pt. is attempting to grasp, and pull blankets up more, using mostly the left hand. 08/15/2022: Pt. has difficulty securely holding, and pulling up blankets, and linens.    Goal status:Deferred  9. Pt. will consistently actively control the releasing of blankets, covers, and linens from his hands  once they are in the desired position over him. Baseline:12/28/2022: Pt. Is consistently actively able to release linens once they are in the desired position over him. 11/16/2022: Pt. continues to improve with actively releasing blankets form his hands. 09/26/2022: Pt. is improving with actively releasing blankets form his hands. 08/15/2022: Pt. has difficulty with controlled releasing of blankets/linens from his grasp.     Goal status: Achieved    10. Pt. Will improve bilateral UE strength by 2 MM grades to assist with ADLs, and IADLs  Baseline: 10/23/2023: Right: shoulder flexion: 4-/5, abduction: 4-/5, elbow flexion: 4+/5 , extension: 4-/5 wrist extension: 2/5; Left: shoulder flexion: 4+/5, abduction: 4+/5, elbow flexion: 5/5 , extension: 4+/5  wrist extension: 3+/5  08/27/23:  Right: shoulder flexion: 3+/5, abduction: 4-/5, elbow flexion: 4+/5 , extension: 4-/5 wrist extension: 2/5; Left: shoulder flexion: 4+/5, abduction: 4+/5, elbow flexion: 5/5 , extension: 4/5  wrist extension: 3/5  08/07/23: Continue 05/31/2023: Continue 05/10/23: Right: shoulder flexion: 3+/5, abduction: 4-/5, elbow flexion: 4+/5 , extension: 4-/5 wrist extension: 2/5; Left: shoulder flexion: 4+/5, abduction: 4+/5, elbow flexion: 5/5 , extension: 4/5  wrist extension: 3/5 03/13/2023: Right: shoulder flexion: 3/5, abduction: 4/5, elbow flexion: 4+/5 , extension: 4-/5 wrist extension: 2/5; Left: shoulder flexion: 4+/5, abduction: 4+/5, elbow flexion: 5/5 , extension: 4/5  wrist extension: 3/5 03/08/23: NT this date d/t as pt arrived late; 02/13/2023: Continue 01/09/2023: Continue 12/28/2022: Right: shoulder flexion: 3/5, abduction: 4/5, elbow flexion: 4+/5 , extension: 4-/5 wrist extension: 2/5; Left: shoulder flexion: 4+/5, abduction: 4+/5, elbow flexion: 5/5 , extension: 4/5  wrist extension: 3/5 Right: shoulder flexion: 3+/5, abduction: 4/5, elbow flexion: 4+/5 , extension: 4+/5 wrist extension: 2/5; Left: shoulder flexion: 4+/5, abduction:  4+/5, elbow flexion: 5/5 , extension: 4+/5 wrist extension: 3-/5 11/16/2022: Right: shoulder flexion: 3/5, abduction: 4/5, elbow flexion: 4+/5 , extension: 4+/5 wrist extension: 2/5; Left: shoulder flexion: 4+/5, abduction: 4+/5, elbow flexion: 5/5 , extension:  wrist extension: 3-/5 Right: shoulder flexion: 3+/5, abduction: 4/5, elbow flexion: 4+/5 , extension: 4+/5 wrist extension: 2/5; Left: shoulder flexion: 4+/5, abduction: 4+/5, elbow flexion: 5/5 , extension: 4+/5 wrist extension: 3-/5 Goal status: Ongoing  11. Pt. will improve right wrist extension by 10 degrees to initiate reaching for items in preparation for ADLs. Baseline: 10/23/23: -30(-8) 08/23/2023: R Wrist Ext: -31(-8)  08/07/23: Continue 05/31/23: Continue 05/10/23: Right wrist extension: -32(22) 03/13/23: -22(35) 03/08/23: NT this date as pt arrived late; 02/13/2023: Continue 01/09/2023: Continue 12/28/2022: -  22(34) 11/16/2022: -30(10)    Goal status: Ongoing      12. Pt. Will require donn/doff a Tank top T-shirt efficiently with supervision and full set-up.    Baseline:10/23/23: Independent doff the shirt/tank, MaxA  donning. 10/23/23: Pt. Is able to independently doff his shirt, requiring assist to lean forward and loosen the back. 08/07/23: Continue 05/31/2023: Pt. Requires maxA donning a pullover sweatshirt, Mod-maxA doffing it. T-shirt tank TBD 05/10/23: Pt. Presents with difficulty completing, and requires increased time to complete    Goal status: Ongoing    13. Pt. Will independently perform bilateral/bimanual hand tasks needed to fold towels/linens/laundry Baseline: 10/23/23: Pt. Continues to have difficulty folding laundry, and linens. 08/23/23: Pt. Continues to have difficulty using bilateral/bimanual hand tasks needed to fold towels/linens/laundry. 08/07/23: Continue 05/31/23: Continue 05/10/23:  Pt. Has difficulty folding towels/lines/laundry using bilateral hands Goal status:Ongoing  14. Pt. Will independently, and efficiently reach his  right arm over the side of the chair to pet his dog.    Baseline: 08/23/2023: Pt. Is now able to reach over the side of the armrest of his recliner chair to pet his dog.  08/07/23: Continue 05/31/2023: Continue 05/10/23: Pt. is unable to efficiently reach his right UE over the side of his recliner chair to pet his dog.    Goal status: Achieved    15. Pt. Will independently, and efficiently reach out in front of him to efficiently place items onto his table while sitting in his recliner.     Baseline: 10/23/23: Pt. Is improving with functional reaching with the left UE out in front of him to place items onto the table. Pt. has difficulty reaching out with the right hand to  place items on the table. 08/23/2023: Pt. has difficulty consistently reaching out in front of him to place items on the on a tabletop surface with the right hand. 08/07/23: Continue 2/26/225: Continue 05/10/23: Pt. has difficulty reaching out in front of him far enough to efficiently place items on the table while sitting in a recliner chair.    Goal status: Ongoing     16. Pt. Will improve bilateral hand supination by 10 degrees to be able  actively turn items in his hand to look at them independently and efficiently during ADLs/IADLs,    Baseline: 10/23/23: Right: unable, L: supination 30(38)    Goal status: New      17. Pt. Will independently, and efficiently transition between picking up finger foods for dipping, and  picking up utensils , then placing them down on the tabletop surface through 75% of the meal with minimal cues.    Baseline: Pt.requires heavy cues, and assist to grasp finger foods, and utensils, and transition between the two.    Goal status: New   ASSESSMENT:  CLINICAL IMPRESSION:   Pt. Reports that they have been using the [employer] consistently at home now, and have built up to approximately 30 minutes due to fatigue. Pt. present's with flexor tightness in the bilateral forearm supination with increased  tightness on the right side, as well as tightness in the digit PIP/DIPs of the bilateral hands. Although limited, Pt. Is able to achieve more digit PIP/DIP extension.  Pt. Required increased time however was able to utilize a lateral grasp to transfer the smooth flat 1.5# circular discs between his hands. Pt. Required less time, and assist to transfer the items from the right hand to the left hand.  Pt. Required increased time, and support for the right hand, however was  able to successfully transfer the discs from the left hand back to the right hand consistently. Pt.'s caregivers continue to be very supportive to continue working towards improving BUE functioning, ROM, strength, motor control, [facility] skills, as well as visual compensatory strategies in order to maximize independence with daily ADLs, and IADL functioning.     PERFORMANCE DEFICITS in functional skills including ADLs, IADLs, coordination, dexterity, proprioception, ROM, strength, pain, FMC, GMC, decreased knowledge of use of DME, and UE functional use, cognitive skills including safety awareness, and psychosocial skills including coping strategies, environmental adaptation, habits, and routines and behaviors.   IMPAIRMENTS are limiting patient from ADLs, IADLs, leisure, and social participation.   COMORBIDITIES may have co-morbidities  that affects occupational performance. Patient will benefit from skilled OT to address above impairments and improve overall function.  MODIFICATION OR ASSISTANCE TO COMPLETE EVALUATION: Maximum or significant modification of tasks or assist is necessary to complete an evaluation.  OT OCCUPATIONAL PROFILE AND HISTORY: Comprehensive assessment: Review of records and extensive additional review of physical, cognitive, psychosocial history related to current functional performance.  CLINICAL DECISION MAKING: High - multiple treatment options, significant modification of task necessary  REHAB POTENTIAL: Fair     EVALUATION COMPLEXITY: High    PLAN: OT FREQUENCY: 2 x's a week  OT DURATION:12 weeks  PLANNED INTERVENTIONS: self care/ADL training, therapeutic exercise, therapeutic activity, neuromuscular re-education, manual therapy, passive range of motion, patient/family education, and cognitive remediation/compensation RECOMMENDED OTHER SERVICES: PT  CONSULTED AND AGREED WITH PLAN OF CARE: Patient and family member/caregiver  PLAN FOR NEXT SESSION: Review HEP   Richardson Otter, MS, OTR/L  11/20/23

## 2023-11-22 ENCOUNTER — Ambulatory Visit: Admitting: Occupational Therapy

## 2023-11-22 ENCOUNTER — Ambulatory Visit

## 2023-11-22 DIAGNOSIS — G4733 Obstructive sleep apnea (adult) (pediatric): Secondary | ICD-10-CM | POA: Diagnosis not present

## 2023-11-27 ENCOUNTER — Ambulatory Visit

## 2023-11-27 ENCOUNTER — Ambulatory Visit: Admitting: Occupational Therapy

## 2023-11-27 DIAGNOSIS — R269 Unspecified abnormalities of gait and mobility: Secondary | ICD-10-CM | POA: Diagnosis not present

## 2023-11-27 DIAGNOSIS — R278 Other lack of coordination: Secondary | ICD-10-CM

## 2023-11-27 DIAGNOSIS — M6281 Muscle weakness (generalized): Secondary | ICD-10-CM

## 2023-11-27 DIAGNOSIS — R262 Difficulty in walking, not elsewhere classified: Secondary | ICD-10-CM | POA: Diagnosis not present

## 2023-11-27 DIAGNOSIS — R2681 Unsteadiness on feet: Secondary | ICD-10-CM | POA: Diagnosis not present

## 2023-11-27 DIAGNOSIS — R2689 Other abnormalities of gait and mobility: Secondary | ICD-10-CM | POA: Diagnosis not present

## 2023-11-27 DIAGNOSIS — H543 Unqualified visual loss, both eyes: Secondary | ICD-10-CM | POA: Diagnosis not present

## 2023-11-27 DIAGNOSIS — I6930 Unspecified sequelae of cerebral infarction: Secondary | ICD-10-CM

## 2023-11-27 NOTE — Therapy (Signed)
 . Occupational Therapy Treatment Note    Patient Name: Paul Hall MRN: [id number] DOB:12/02/1995, 28 y.o., male Today's Date: 11/27/2023  PCP: Dr. Bertell REFERRING PROVIDER: Dr. Bertell   .   OT End of Session - 11/27/23 2257     Visit Number [id number]    Number of Visits 180    Date for OT Re-Evaluation 01/16/24    OT Start Time 1622    OT Stop Time 1700    OT Time Calculation (min) 38 min    Equipment Utilized During Treatment tilt in space power wc    Activity Tolerance Patient tolerated treatment well    Behavior During Therapy WFL for tasks assessed/performed                 Past Medical History:  Diagnosis Date   Diabetes mellitus (HCC)    Hypertension    Stroke [facility])    Past Surgical History:  Procedure Laterality Date   IVC FILTER PLACEMENT ([facility] HX)     LEG SURGERY     PEG TUBE PLACEMENT     TRACHEOSTOMY     Patient Active Problem List   Diagnosis Date Noted   Sepsis due to vancomycin  resistant Enterococcus species (HCC) 06/06/2019   SIRS (systemic inflammatory response syndrome) (HCC) 06/05/2019   Acute lower UTI 06/05/2019   VRE (vancomycin -resistant Enterococci) infection 06/05/2019   Anemia 06/05/2019   Skin ulcer of sacrum with necrosis of muscle (HCC)    Urinary retention    Type 2 diabetes mellitus without complication, with long-term current use of insulin  (HCC)    Tachycardia    Lower extremity edema    Acute metabolic encephalopathy    Obstructive sleep apnea    Morbid obesity with BMI of 60.0-69.9, adult (HCC)    Goals of care, counseling/discussion    Palliative care encounter    Sepsis (HCC) 04/27/2019   H/O insulin  dependent diabetes mellitus 04/27/2019   History of CVA with residual deficit 04/27/2019   Seizure disorder (HCC) 04/27/2019   Decubitus ulcer of sacral region, stage 4 (HCC) 04/27/2019   ONSET DATE: 01/2019  REFERRING DIAG: CVA/COVID-19  THERAPY DIAG:  Muscle weakness (generalized)  Rationale for  Evaluation and Treatment Rehabilitation  SUBJECTIVE:    SUBJECTIVE STATEMENT:  Pt. Reports still not feeling great after having a summer cold.   Pt accompanied by: self and family member  PERTINENT HISTORY:  Pt. is a 28 y.o. male who was diagnosed with COVID-19, and CVA with resultant quadriplegia. Pt. Was then hospitalized with VRE UTI. PMHx includes: urinary retention, seizure disorder, obstructive sleep disorder, DM Type II, Morbid obesity.   PRECAUTIONS: None  WEIGHT BEARING RESTRICTIONS No  PAIN:  No reports of pain Are you having pain? 0/10  FALLS: Has patient fallen in last 6 months? No  LIVING ENVIRONMENT: Lives with: lives with their family Lives in: House/apartment Stairs: No Level Entry Has following equipment at home: Wheelchair (power) and hoyer lift, sit to stand lift  PLOF: Independent  PATIENT GOALS: To be able to engage in more daily care tasks.  OBJECTIVE:   HAND DOMINANCE: Right  ADLs:  Transfers/ambulation related to ADLs: Eating: Pt. reports being able to hold standard utensils, and is starting to engage more in self-feeding tasks, hand to mouth patterns. Pt. Reports that he does as much as he can with the task, and family assists  with the remainder of the task. Grooming: Pt. is able to initiate holding an electric toothbrush, and brush his teeth.  Family assists LB Dressing: Total Assist UE dressing: Pt. is now able to reach up to actively assist with grasping , and pulling his gown down. Toileting:  Total Assist Bathing: MaxA UB, Total assist LB Tub Shower transfers: N/A Equipment: See above    IADLs: Shopping: Relies on family to assist Light housekeeping: Total Assist Meal Prep: Total Assist Community mobility:   Medication management:  Total Assist  Financial management: N/A Handwriting: Not legible: Pt. Is able to hold a pen with the left hand, and initiate marking the page. Pt.'s eye glasses were not available  MOBILITY STATUS:   Power w/c  POSTURE COMMENTS:  Pt. Requires position changes in his power w/c  ACTIVITY TOLERANCE: Activity tolerance:  Fair  FUNCTIONAL OUTCOME MEASURES: FOTO: 40 TR score: 45  05/25/2022:   FOTO: 50 TR score: 45  07/20/22: FOTO 55  03/08/23: 51  UPPER EXTREMITY ROM     Active ROM Left eval Left  03/21/2022 Left 05/25/2022 L  07/20/22 Left 07/25/2022 Left  08/15/2022 Left  09/26/2022 Left  11/16/2022 Left 12/28/2022 Left 03/13/23 Left 05/10/23 Left 10/23/23  Shoulder flexion 119 123 125 130  136 138 130 142 144 144 148  Shoulder abduction 110 115 115 115  118 121 125 142 140 140 140  Shoulder adduction              Shoulder extension              Shoulder internal rotation              Shoulder external rotation              Elbow flexion 135(135) 145 145 145  145    145 145   Elbow extension -27(-18) -21(-20) -20(-16)  -25(-10) -23(-10) -21(-10) -20(-18) -20(-18) -20(-17) -17(-16) -12(-8)  Wrist flexion              Wrist extension 10(50) 19(50) 28(50)  30(50) 35(55) 36(55) 36(60) 40(60) 45(60) 45(60) 42(54)  Wrist ulnar deviation              Wrist radial deviation              Wrist pronation            60(74)  Wrist supination   Limited by flexor tone       10(15)  30(38)  (Blank rows = not tested)    Active ROM Right eval Right 03/21/2022 Right 05/25/2022 R  07/20/22 Right  07/25/22 Right 08/15/2022 Right 09/26/2022 Right 11/16/2022 Right 12/28/2022 Right 03/13/23 Right 05/10/23 Right 10/23/23  Shoulder flexion 106 scaption 105  Scaption 62 flexion 110 scaption 100  105 105 105 112 Scaption 123 Scaption 122  Scaption 142 Scaption  Shoulder abduction 114 90 97 100  105 117 120 124 124 122 144  Shoulder adduction              Shoulder extension              Shoulder internal rotation              Shoulder external rotation              Elbow flexion 120(130) 145 145 140  144 140 142 148 148 145   Elbow extension -45(-35) -22(-35) -28(-22)  -32(-21) -32(-28)  -28(-26) -28(-28) -27(-26) -27(-40) -38(-35) -34(-26)  Wrist flexion              Wrist extension -30(10) -25(10) -10(30)  -10(20) -  10(25) -20(40) -30(10) -22(34) -22(35) -32(22) -30(-8)  Wrist ulnar deviation              Wrist radial deviation              Wrist pronation              Wrist supination   Limited by flexor tone                12/28/2022: Thumb radial abduction: Right: 12(46), Left: 40(54)  Digits: Bilateral 2nd through 5th digit: MP Hyperextension with PIP, an DIP flexion    10/23/23:  Bilateral 2nd through 5th digit MP hyperextension with PIP, and DIP flexion: Pt. has been intermittently able to initate active diigt PIP, and DIP extension following Botox during this past recertification/progress reporting period.     UPPER EXTREMITY MMT:     Left eval Left 03/21/2022 Left  05/25/2022 L 07/20/22 Left  08/15/2022 Left 09/26/2022 Left 11/16/2022 Left 12/28/2022 Left  03/13/2023 Left  05/10/23 Left 10/23/23  Shoulder flexion 3-/5 3/5 3+/5 4+/5 4+/5 4+/5 4+/5 4+/5 4+/5 4+/5 4+/5  Shoulder abduction 3-/5 3-/5 4-/5 4+/5 4+/5 4+5 4+/5 4+/5 4+/5 4+/5 4+/5  Shoulder adduction             Shoulder extension             Shoulder internal rotation             Shoulder external rotation             Elbow flexion 3/5 3+/5 4-/5 4+/5 5/5 5/5 5/5 5/5 5/5 5/5 5/5  Elbow extension 2-/5 2/5  4+/5 4+/5 4+/5  4/5 4/5 4/5 through available range 4+/5 through available range  Wrist flexion             Wrist extension 2/5 2+/5 3-/5  3-/5 3-/5 3-/5 3/5 3/5 3/5 3+/5  Wrist ulnar deviation             Wrist radial deviation             Wrist pronation             Wrist supination             (Blank rows = not tested)  Right eval Right 03/21/2022 Right  05/25/2022 R 07/20/22 Right 08/15/2022 Right 09/26/2022 Right 11/16/2022 Right 12/28/2022 Right 03/13/2023 Right 05/10/23 Right 10/23/23  Shoulder flexion 3-/5 scaption 3-/5 3-/5 3+/5 3+/5 3+/5 3/5 3/5 3/5 3+/5 4-/5  Shoulder abduction 3-/5  3-/5 3-/5 4/5 4/5 4/5 4/5 4/5 4/5 4/-5 4-/5  Shoulder adduction             Shoulder extension             Shoulder internal rotation             Shoulder external rotation             Elbow flexion 3/5 3/5 3+/5 4/5 4+/5 4+/5 4+/5 4+/5 4+/5 4+/5 4+/5  Elbow extension 2-/5 2/5 3-/5 4+/5 4+/5 4+/5 4+/5 4-/5 4-/5 4-/5 4-/5 through available range  Wrist flexion             Wrist extension 2-/5 2-/5 2/5 2/5 2/5 2/5 2/5 2/5 2/5 2/5 2/5  Wrist ulnar deviation             Wrist radial deviation             Wrist pronation  Wrist supination               HAND FUNCTION:  Grip strength: Right: 0 lbs; Left: 0 lbs and Lateral pinch: Right: 5 lbs, Left: 2 lbs  03/21/2022: Lateral pinch: Right: 3.5 lbs, Left: 2 lbs  07/20/22: Grip strength: Right: 3 lbs; Left: 4 lbs and Lateral pinch: Right: 5 lbs, Left: 3 lbs  08/15/2022:  Grip strength: Right: 0 lbs; Left: 5 lbs and Lateral pinch: Right: 2 lbs, Left: 4 lbs  09/26/2022  Grip strength: Right: 0 lbs; Left: 8 lbs and Lateral pinch: Right: 2 lbs, Left: 5 lbs  11/16/2022  Grip strength: Right: 0 lbs; Left: 8 lbs  9/25/20204  Grip strength: Right: 0 lbs; Left: 8 lbs  Grip strength: Right: 0 lbs; Left: 8 lbs   03/08/23:  Grip strength: Right: 0 lbs without PROM, 5 lbs after PROM, Left 8 lbs  05/10/23:  Grip strength: TBD  Bilateral digit PIP/DIP flexion contractures with MP hyperextension with attempts for AROM. Pt. is able to tolerate AROM to the bilateral digits at the initial evaluation however, has a history of pain in the digits.  COORDINATION: Eval: Pt. is unable to grasp 9-hole test pegs. Pt. is able to initiate grasping larger pegs, and is able to hold a pen in the left hand.  07/20/22: 2 min 36 seconds to remove 9 pegs from 9 hole peg test - cues to locate pegs 2/2 low vision. Pt. is able to initiate grasping larger pegs on R hand and is able to hold a pen in the left hand.  08/15/2022:    Vision, and sensation  limiting accuracy of 9 hole peg test results. Pt. Was able to grasp and remove vertical pegs intermittently with cues.    05/10/23: TBD  SENSATION: Light touch: Impaired   EDEMA:  N/A  MUSCLE TONE: BUE flexor Spasticity  COGNITION Overall cognitive status: Continue to assess in functional context  VISION:   Subjective report: Pt. was not wearing glasses at the time of the initial eval.  Baseline vision: Vision is very limited. Wears glasses all the time Visual history: History of impaired vision following CVA. Pt. Has received treatment through the [facility] low vision rehabilitation program.   VISION ASSESSMENT: Impaired To be further assessed in functional context  PERCEPTION: Impaired   PRAXIS: Impaired: motor planning  OBSERVATIONS:  Pt reports being on Tramadol   TODAY'S TREATMENT 10/30/23   Therapeutic Ex.:  -BUE PROM was performed with slow prolonged gentle stretching of bilateral hands including wrist/digit flexion/extension including Mps, PIPs, DIPs of all digits, and wrist flexion/extension, and forearm supination in conjunction with moist heat modality 2/2 flexor tone, and tightness  Therapeutic Activities:   -facilitated bilateral [facility] skills grasping 1.5 smooth flat discs from the therapist's hand with right hand using a lateral pinch grasp, bringing it to midline, and transferring it to the left hand, then transferring it back to the right hand from the left hand.  Pt. Progressed to transferring a quarter from the right ahnd to the left hand with increased time to complete, and minimal visual cues. Pt. Required less support at the right wrist.       PATIENT EDUCATION: Education details: Incorporating Active Right hand supination during self-feeding tasks, grasping and releasing utensils, grasping snack foods from plate. Person educated: Patient and Parent Education method: Explanation Education comprehension: verbalized understanding  HOME EXERCISE PROGRAM:     Incorporating  the right hand during self-feeding tasks.   GOALS: Goals reviewed  with patient? Yes  SHORT TERM GOALS: Target date: 12/05/2023   To assess splint fit, and make appropriate adjustments to promote good skin integrity through the palmar surface of the bilateral hands.  Baseline: 05/25/22: Goal currently met, however ongoing as needs to assess splint fit arise. 03/23/2022: Pt. is wearing splints a couple of hours at night bilateral resting hand splints. 03/21/2022: Pt. is wearing splints a couple of hours at night bilateral resting hand splints. Goal status: Deferred   LONG TERM GOALS: Target date: 01/15/2024   FOTO score will Improve by 2 points for Pt. perceived improvement with the assessment specific ADL/IADL tasks.  Baseline: 03/08/23: 51; 12/28/2022: FOTO score: 56; 11/16/2022: 52 09/27/2022: 51 07/20/2022: FOTO 55 05/25/2022: FOTO score: 50,  TR score: 45 Eval: FOTO score: 40, TR score: 45 Goal status: Achieved   2.   Pt. will independently perform oral care for 100% of the task after complete set-up. Baseline: 05/10/23: Pt. Performs 90% of the task. Pt.'s mother assists with 10% of the task for thorough cleaning the hard to reach places way in the back of the oral cavity.03/08/23: not as much assist required proximally, but to adjust positioning of toothbrush; 02/13/23: Pt. Continues to require assist proximally 01/09/2023: Continue 12/28/2022:  Pt. Continues to complete 90% of the task with complete set-up, with visual cues, and occassional assist of the right elbow. 11/16/2022: Pt. Completes 90% of the task with complete set-up. 09/26/2022: Completes 90% of the task, proximal assist at times required at the elbow.  08/15/2022: Completes 90% of the task, proximal assist at times required at the elbow. 07/20/22: completes 90% of task, limited by shoulder flexion. 05/25/2022:  Pt. Is able to initiate and perform oral care for approximately 90% of the task. Complete set-up required. Assi  needed only for the very back teeth. 03/23/2022: Pt. Is able to initiate and perform oral care for approximately 75% of the task. Pt. Requires assist at proximally at the elbow for through oral care. 03/21/2022: Pt. Is able to initiate and perform oral care for approximately 75% of the task. Pt. Requires assist at proximally at the elbow for through oral care. Eval: Pt. is able to initiate using an electric toothbrush. Pt. requires assist for set-up, and assist for thoroughness, and as he Pt. fatigues. Goal status:  Partially met, Goal discontinued  3.  Pt. Will be modified independence with self-feeding for 100% using a swivel spoon, and standard fork after complete set-up Baseline: 05/10/2023: Pt. feeds self 95-100% of the meal. with occasional assist to reposition the utensil. 03/08/23: indep with cup with a handle and lid, assist to reposition eating utensils in hand;  02/13/23:  Pt. Is now able to consistently, and efficiently use a swivel spoon for eating cereal with the left hand.01/09/2023: Continue 12/28/2022: Pt. Is now feeding himself 90% of the time using a swivel spoon with the left hand, and 95% of the time with the fork. Pt. Continues to have difficulty with cutting food. 11/16/2022: 100% with finger foods, Pt. Is initiating with a swivel spoon, and universal cuff for fork use, however requires assist for consistency, and accuracy.  09/26/2022: 90% using the spoon, assist at time with the forearm motion, and grip on the utensils. 100% for finger foods.  08/15/2022:  90% using the spoon, assit at time with the forearm motion, and grip on the utensils. 100% for finger foods-independent with set-up- unsupervised.  07/20/22: self-feeds cereal using spoon 90% of task. 05/25/2022: Pt. Is able to use a  spoon to scoop cereal when feeding himself cereal 85% of the time. Pt. Is able to feed himself snack/finger foods 100% of the time. Pt. Continues to work on consistency of  stabilizing a cup/mug when drinking. Pt.  Is able to grasp a water  bottle with assist initially, with assist tapering off as he drinks.03/23/2022: Pt. Is able to perform scooping cereal for 75% of the time. Pt. required assist, and support at the left elbow, and Pt. Presents with limited forearm supination when using the spoon, and bringing it towards his mouth. Pt. Is able to use a fork to spear items, and perform the hand to mouth pattern.  03/21/2022: Pt. Is able to perform scooping cereal for 75% of the time. Pt. required assist, and support at the left elbow, and Pt. presents with limited forearm supination when using the spoon, and bringing it towards his mouth. Pt. Is able to use a fork to spear items, and perform the hand to mouth pattern.  Eval: Pt. is able to hold standard standard utensils. Pt. Performs as much of the task as he, can and has assistance for the remainder. Goal status:  Improved, Achieved  4.  Pt. will improve grasp patterns and consistently grasp 1/4 objects for ADL, and IADL tasks.  Baseline: 10/23/2023: Pt. is able to initiate grasping small objects visual cues, and increased tim, however continues to work towards grasping 1/4 objects efficiently. 08/27/23: Pt. is able to grasp flat discs, and sustain a secure hold on them while moving the UE through various planes. 08/07/23: Continue 05/31/2023: Continue 05/10/23: Pt. is consistently able to grasp 1/2 objects with visual cues. Pt. Requires assist, and increased visual cues to grasp 1/4 objects on an elevated plane.  Pt. Is unable to grasp the flat objects at the tabletop surface. Pt. Is grasping grasping small puppy vitamins.03/08/23: 1/2 objects efficiently; 02/13/2023: 1/2 objects efficiently 01/09/2023: Continue 12/28/2022: Continues to grasp 1/4 pegs with min a + visual cues, consistently grasping 1/2 objects with visual cues 11/16/2022: Continues to grasp 1/4 pegs with min a + visual cues, consistently grasping 1/2 objects with visual cues 09/26/2022: Continues to  grasp 1/4 pegs with min a + visual cues, consistently grasping 1/2 objects with visual cues. 08/15/2022: grasps 1/4 pegs with min a + visual cues, consistently grasping 1/2 objects with visual cues. 07/20/22: grasps 1/4 pegs with min a + visual cues, consistently grasping 1/2 objects with visual cues. 05/25/2022: Pt. Is working on improving consistency of grasping 1/2 objects with visual cues.  03/23/2022: Pt. Is able to grasp 1 objects consistently,and continues to work on the hand patterns needed to grasp 1/2 objects.03/21/2022: Pt. Is able to grasp 1 objects consistently,and continues to work on the hand patterns needed to grasp 1/2 objects. Eval: Pt. is able to grasp 1 objects intermittently using a lateral grasp pattern. Goal status: Ongoing  5.  Pt. will independently write his name legibly with letter sizes under 1. Baseline: 05/10/23: Deferred 03/08/23: Not addressed this period; 02/13/2023: Continue 01/09/2023: Continue. 12/28/2022: Pt. is now using an IPad Pen. 11/16/2022: Pt. Continues to be able to write name with smaller letter size. Pt. Is signing name with a computer styus. Pt. Requires visual cues, and assist 09/26/2022: Pt. Continues to be able to write name with smaller letter size. Pt. Is signing name with a computer styus. Pt. Requires visual cues, and assist. 08/15/2022: Pt. Is able to write name with smaller letter size. Pt. Is signing name with a computer styus. Pt. Requires visual  cues, and assist. 07/20/22: stabilizing assist to write name with 75% legibility with 2 letters. 05/25/2022: Pt. to continue to work towards formulating grasp patterns in preparation for grasping a large width pen. Pt. Requires visual cues. 03/23/2022: Pt. Is able to write his name with modA, however has difficulty with formulating letter sizes less than 2 in size with 50% legibility for the 3 letters of his name.03/21/2022: Pt. Is able to write his name with modA, however has difficulty with formulating  letter sizes less than 2 in size with 50% legibility for the 3 letters of his name. Eval: Pt is able to hold a thin marker with his left hand, and formulate a line, and initiate a circular pattern (Pt. without glasses today) Goal status: Deferred  6. Pt. Will reach up to comb/brush his hair with minA.  Baseline: 05/10/23: Pt. is able to use a pick independently with the left hand with occasional assist to the far side of the left his head. 03/08/23: Pt uses pick in L hand to reach 75% of his head; unable to reach R side of head; 02/13/2023:Pt. is able to use a hair pick with the left hand on the right side of his head, top, and back of the head. Pt. Continues to have difficulty reaching further to the right side of his head with the left. 01/09/2023: Continue 12/28/2022: Pt. Is progressing, and is now able to use a hair pick with the left hand on the right side of his head, top, and back of the head. Pt. Continues to have difficulty reaching further to the right side of his head with the left. 11/16/2022: Pt. Is able to use a hair pick for the left side of his head using his left hand. Pt. Presents with difficulty reaching to the right side of his head.09/26/2022: Pt. Is able to use a hair pick for the left side of his head using his left hand. Pt. Presents with difficulty reaching to the right side of his head. 5/13/202: 75% using a hair pick, assist to the right side of the head. 07/20/22: reaches 75% of head, assist for far R side of head, fatigues quickly. 05/25/2022: Pt. Is able to reach up with the left hand to the left side, top, and back of his head. 03/23/2022: Pt. is now able to more consistently initiate reaching up to his head with his left hand in preparation for haircare12/18/2023: Pt. is now able to more consistently initiate reaching up to his head with his left hand in preparation for haircare. Eval: Pt. is able to initiate reaching up for hair care with a long handled brush, however is unable to  sustain UE's in elevation to perform the task.     Goal status: Achieved  7. Pt. Will independently navigate the w/c through his environment with minA with visual scanning, and hand placement on the controls.  Baseline: 11/16/2022: Deferred 2/2 vision 09/26/2022: Pt. Is now navigating his w/c ouside the home, and around his block. 08/15/2022: Pt. Requires minA to setup hand on controls and MIN cues to navigate the w/c in wide spaces, requires MIN - MOD cues to navigate the w/c through more narrow doorways, and tighter turns - varies based on good vs bad vision days.07/20/22: Pt. Requires minA to setup hand on controls and MIN cues to navigate the w/c in wide spaces, requires MIN - MOD cues to navigate the w/c through more narrow doorways, and tighter turns - varies based on good vs bad vision days.  05/25/2022: Pt. Requires minA  and  cues to navigate the w/c in wide spaces, and requires Mod cues to navigate the w/c through more narrow doorways, and tighter turns. Pt. Requires max cues for scanning through the environment, and moderate cues for hand placement on the controls.     Goal status: Deferred 2/2 vision    8. Pt. Will improve bilateral grip strength to be able to independently grasp, and pull up blankets, and linens.   Baseline: 05/10/23: Deferred 03/08/23: R grip 0-5 lbs, L grip 8 lbs; pt reports he can consistently pull up blankets and linens independently.  02/13/2023: Continue. Pt. presents with digit PIP, and DIP flexor tightness. 01/09/2023: Continue 12/28/2023: R: 0#, L: 5# Pt. Is now able to more consistently grasp and pull blankets up over him. 11/16/2022: R: 0 L: 8# Pt. Is more consistently grasping his blankets and attempting to pull them up. 09/26/2022: R: 0  L: 8# Pt. is attempting to grasp, and pull blankets up more, using mostly the left hand. 08/15/2022: Pt. has difficulty securely holding, and pulling up blankets, and linens.    Goal status:Deferred  9. Pt. will consistently actively control  the releasing of blankets, covers, and linens from his hands once they are in the desired position over him. Baseline:12/28/2022: Pt. Is consistently actively able to release linens once they are in the desired position over him. 11/16/2022: Pt. continues to improve with actively releasing blankets form his hands. 09/26/2022: Pt. is improving with actively releasing blankets form his hands. 08/15/2022: Pt. has difficulty with controlled releasing of blankets/linens from his grasp.     Goal status: Achieved    10. Pt. Will improve bilateral UE strength by 2 MM grades to assist with ADLs, and IADLs  Baseline: 10/23/2023: Right: shoulder flexion: 4-/5, abduction: 4-/5, elbow flexion: 4+/5 , extension: 4-/5 wrist extension: 2/5; Left: shoulder flexion: 4+/5, abduction: 4+/5, elbow flexion: 5/5 , extension: 4+/5  wrist extension: 3+/5  08/27/23:  Right: shoulder flexion: 3+/5, abduction: 4-/5, elbow flexion: 4+/5 , extension: 4-/5 wrist extension: 2/5; Left: shoulder flexion: 4+/5, abduction: 4+/5, elbow flexion: 5/5 , extension: 4/5  wrist extension: 3/5  08/07/23: Continue 05/31/2023: Continue 05/10/23: Right: shoulder flexion: 3+/5, abduction: 4-/5, elbow flexion: 4+/5 , extension: 4-/5 wrist extension: 2/5; Left: shoulder flexion: 4+/5, abduction: 4+/5, elbow flexion: 5/5 , extension: 4/5  wrist extension: 3/5 03/13/2023: Right: shoulder flexion: 3/5, abduction: 4/5, elbow flexion: 4+/5 , extension: 4-/5 wrist extension: 2/5; Left: shoulder flexion: 4+/5, abduction: 4+/5, elbow flexion: 5/5 , extension: 4/5  wrist extension: 3/5 03/08/23: NT this date d/t as pt arrived late; 02/13/2023: Continue 01/09/2023: Continue 12/28/2022: Right: shoulder flexion: 3/5, abduction: 4/5, elbow flexion: 4+/5 , extension: 4-/5 wrist extension: 2/5; Left: shoulder flexion: 4+/5, abduction: 4+/5, elbow flexion: 5/5 , extension: 4/5  wrist extension: 3/5 Right: shoulder flexion: 3+/5, abduction: 4/5, elbow flexion: 4+/5 , extension: 4+/5  wrist extension: 2/5; Left: shoulder flexion: 4+/5, abduction: 4+/5, elbow flexion: 5/5 , extension: 4+/5 wrist extension: 3-/5 11/16/2022: Right: shoulder flexion: 3/5, abduction: 4/5, elbow flexion: 4+/5 , extension: 4+/5 wrist extension: 2/5; Left: shoulder flexion: 4+/5, abduction: 4+/5, elbow flexion: 5/5 , extension:  wrist extension: 3-/5 Right: shoulder flexion: 3+/5, abduction: 4/5, elbow flexion: 4+/5 , extension: 4+/5 wrist extension: 2/5; Left: shoulder flexion: 4+/5, abduction: 4+/5, elbow flexion: 5/5 , extension: 4+/5 wrist extension: 3-/5 Goal status: Ongoing  11. Pt. will improve right wrist extension by 10 degrees to initiate reaching for items in preparation for ADLs. Baseline: 10/23/23: -30(-8)  08/23/2023: R Wrist Ext: -31(-8)  08/07/23: Continue 05/31/23: Continue 05/10/23: Right wrist extension: -67(77) 03/13/23: -22(35) 03/08/23: NT this date as pt arrived late; 02/13/2023: Continue 01/09/2023: Continue 12/28/2022: -22(34) 11/16/2022: -30(10)    Goal status: Ongoing      12. Pt. Will require donn/doff a Tank top T-shirt efficiently with supervision and full set-up.    Baseline:10/23/23: Independent doff the shirt/tank, MaxA  donning. 10/23/23: Pt. Is able to independently doff his shirt, requiring assist to lean forward and loosen the back. 08/07/23: Continue 05/31/2023: Pt. Requires maxA donning a pullover sweatshirt, Mod-maxA doffing it. T-shirt tank TBD 05/10/23: Pt. Presents with difficulty completing, and requires increased time to complete    Goal status: Ongoing    13. Pt. Will independently perform bilateral/bimanual hand tasks needed to fold towels/linens/laundry Baseline: 10/23/23: Pt. Continues to have difficulty folding laundry, and linens. 08/23/23: Pt. Continues to have difficulty using bilateral/bimanual hand tasks needed to fold towels/linens/laundry. 08/07/23: Continue 05/31/23: Continue 05/10/23:  Pt. Has difficulty folding towels/lines/laundry using bilateral hands Goal  status:Ongoing  14. Pt. Will independently, and efficiently reach his right arm over the side of the chair to pet his dog.    Baseline: 08/23/2023: Pt. Is now able to reach over the side of the armrest of his recliner chair to pet his dog.  08/07/23: Continue 05/31/2023: Continue 05/10/23: Pt. is unable to efficiently reach his right UE over the side of his recliner chair to pet his dog.    Goal status: Achieved    15. Pt. Will independently, and efficiently reach out in front of him to efficiently place items onto his table while sitting in his recliner.     Baseline: 10/23/23: Pt. Is improving with functional reaching with the left UE out in front of him to place items onto the table. Pt. has difficulty reaching out with the right hand to  place items on the table. 08/23/2023: Pt. has difficulty consistently reaching out in front of him to place items on the on a tabletop surface with the right hand. 08/07/23: Continue 2/26/225: Continue 05/10/23: Pt. has difficulty reaching out in front of him far enough to efficiently place items on the table while sitting in a recliner chair.    Goal status: Ongoing     16. Pt. Will improve bilateral hand supination by 10 degrees to be able  actively turn items in his hand to look at them independently and efficiently during ADLs/IADLs,    Baseline: 10/23/23: Right: unable, L: supination 30(38)    Goal status: New      17. Pt. Will independently, and efficiently transition between picking up finger foods for dipping, and  picking up utensils , then placing them down on the tabletop surface through 75% of the meal with minimal cues.    Baseline: Pt.requires heavy cues, and assist to grasp finger foods, and utensils, and transition between the two.    Goal status: New   ASSESSMENT:  CLINICAL IMPRESSION:   Pt. Reports that he has been sick with a summer cold, and has an MD appointment this week.  Pt. Reports that he is still feeling week. Pt. Continues to present  with flexor tightness in the bilateral forearm supination with increased tightness on the right side, as well as tightness in the digit PIP/DIPs of the bilateral hands.  Pt. Required increased time however was able to utilize a lateral grasp to transfer the smooth flat 1.5# circular discs between the left and right  hands more efficiently today.  Pt. Was able to progress with transferring a quarter from the right hand to the left hand in midline. Pt.'s caregivers continue to be very supportive to continue working towards improving BUE functioning, ROM, strength, motor control, [facility] skills, as well as visual compensatory strategies in order to maximize independence with daily ADLs, and IADL functioning.     PERFORMANCE DEFICITS in functional skills including ADLs, IADLs, coordination, dexterity, proprioception, ROM, strength, pain, FMC, GMC, decreased knowledge of use of DME, and UE functional use, cognitive skills including safety awareness, and psychosocial skills including coping strategies, environmental adaptation, habits, and routines and behaviors.   IMPAIRMENTS are limiting patient from ADLs, IADLs, leisure, and social participation.   COMORBIDITIES may have co-morbidities  that affects occupational performance. Patient will benefit from skilled OT to address above impairments and improve overall function.  MODIFICATION OR ASSISTANCE TO COMPLETE EVALUATION: Maximum or significant modification of tasks or assist is necessary to complete an evaluation.  OT OCCUPATIONAL PROFILE AND HISTORY: Comprehensive assessment: Review of records and extensive additional review of physical, cognitive, psychosocial history related to current functional performance.  CLINICAL DECISION MAKING: High - multiple treatment options, significant modification of task necessary  REHAB POTENTIAL: Fair    EVALUATION COMPLEXITY: High    PLAN: OT FREQUENCY: 2 x's a week  OT DURATION:12 weeks  PLANNED INTERVENTIONS:  self care/ADL training, therapeutic exercise, therapeutic activity, neuromuscular re-education, manual therapy, passive range of motion, patient/family education, and cognitive remediation/compensation RECOMMENDED OTHER SERVICES: PT  CONSULTED AND AGREED WITH PLAN OF CARE: Patient and family member/caregiver  PLAN FOR NEXT SESSION: Review HEP   Richardson Otter, MS, OTR/L  11/27/23

## 2023-11-28 DIAGNOSIS — G4733 Obstructive sleep apnea (adult) (pediatric): Secondary | ICD-10-CM | POA: Diagnosis not present

## 2023-11-28 DIAGNOSIS — I10 Essential (primary) hypertension: Secondary | ICD-10-CM | POA: Diagnosis not present

## 2023-11-28 DIAGNOSIS — I6930 Unspecified sequelae of cerebral infarction: Secondary | ICD-10-CM | POA: Diagnosis not present

## 2023-11-28 DIAGNOSIS — Z993 Dependence on wheelchair: Secondary | ICD-10-CM | POA: Diagnosis not present

## 2023-11-28 DIAGNOSIS — E1165 Type 2 diabetes mellitus with hyperglycemia: Secondary | ICD-10-CM | POA: Diagnosis not present

## 2023-11-28 NOTE — Therapy (Signed)
 OUTPATIENT PHYSICAL THERAPY TREATMENT   Patient Name: Paul Hall MRN: [id number] DOB:1995/05/20, 28 y.o., male Today's Date: 11/28/2023  PCP:  [facility] PROVIDER:  Morene Sous, PA-C   PT End of Session - 11/27/23 [phone]     Visit Number [id number]    Number of Visits 184   date corrected from most recent recert   Date for PT Re-Evaluation 11/27/23    Authorization Type [employer] [place] [employer]    Authorization Time Period 01/04/21-03/29/21; Recert 03/24/2021-06/16/2021; Recert 09/15/2021- 12/08/2021; Recert 12/13/2021-03/07/2022    Progress Note Due on Visit 180    PT Start Time 1551    PT Stop Time 1614    PT Time Calculation (min) 23 min    Equipment Utilized During Treatment Gait belt    Activity Tolerance Patient tolerated treatment well    Behavior During Therapy WFL for tasks assessed/performed                               Past Medical History:  Diagnosis Date   Diabetes mellitus (HCC)    Hypertension    Stroke (HCC)    Past Surgical History:  Procedure Laterality Date   IVC FILTER PLACEMENT ([facility] HX)     LEG SURGERY     PEG TUBE PLACEMENT     TRACHEOSTOMY     Patient Active Problem List   Diagnosis Date Noted   Sepsis due to vancomycin  resistant Enterococcus species (HCC) 06/06/2019   SIRS (systemic inflammatory response syndrome) (HCC) 06/05/2019   Acute lower UTI 06/05/2019   VRE (vancomycin -resistant Enterococci) infection 06/05/2019   Anemia 06/05/2019   Skin ulcer of sacrum with necrosis of muscle (HCC)    Urinary retention    Type 2 diabetes mellitus without complication, with long-term current use of insulin  (HCC)    Tachycardia    Lower extremity edema    Acute metabolic encephalopathy    Obstructive sleep apnea    Morbid obesity with BMI of 60.0-69.9, adult (HCC)    Goals of care, counseling/discussion    Palliative care encounter    Sepsis (HCC) 04/27/2019   H/O insulin  dependent diabetes mellitus 04/27/2019   History  of CVA with residual deficit 04/27/2019   Seizure disorder (HCC) 04/27/2019   Decubitus ulcer of sacral region, stage 4 (HCC) 04/27/2019    REFERRING DIAG: Cerebral infarction, unspecified   THERAPY DIAG:  Muscle weakness (generalized)  Other lack of coordination  History of CVA with residual deficit  Difficulty in walking, not elsewhere classified  Unsteadiness on feet  Other abnormalities of gait and mobility  Abnormality of gait and mobility  Rationale for Evaluation and Treatment Rehabilitation  PERTINENT HISTORY: Paul Hall is a 26yoM who presents with severe weakness, quadriparesis, altered sensorium, and visual impairment s/p critical illness and prolonged hospitalization. Pt hospitalized in October 2020 with ARDS 2/2 COVID19 infection. Pt sustained a complex and lengthy hospitalization which included tracheostomy, prolonged sedation, ECMO. In this period pt sustained CVA and SDH. Pt has now been liberated from tracheostomy and G-tube. Pt has since been hospitalized for wound infection and UTI. Pt lives with parents at home, has hospital bed and left chair, hoyer lift transfers, and power WC for mobility needs. Pt needs heavy physical assistance with ADL 2/2 BUE contractures and motor dysfunction   PRECAUTIONS: Fall  SUBJECTIVE:  Patient reports being late again due to public transportation services running behind. Reports feeling like he has a head cold.  PAIN:  Are you having pain? No. But feeling tight.    TODAY'S TREATMENT:  - 11/27/2023     Self -Care/Home management:  Discussed the fact that patient needs a letter of medical necessity for his request for assistance in obtaining a transport van.  Performed assessment of his power w/c including dimensions- 60+ in x 32 in x 60 in. Discussed need for side entry ramp and how this fleeta could impact his quality of life including much improved attendance to medical visits and allow more social outings for quality of  life. Discussed reasons including significant weakenss and inability to transfer in/out of current cars at home. Discussed safety risk and possibly bodily harm to patient/caregiver with attempted transfers. Discussed his PMH and why the fleeta is necessary.  PT will write up a letter of medical necessity to assist patient in the process of trying to obtain necessary medical equipment.        PATIENT EDUCATION: Education details: Exercise technique with VC   HOME EXERCISE PROGRAM:  Access Code: [email or web address] URL: [email or web address] Date: 03/23/2022 Prepared by: Reyes London  Exercises - Supine Bridge  - 3 x weekly - 3 sets - 10 reps - 2 hold - Supine Gluteal Sets  - 3 x weekly - 3 sets - 10 reps - 5 sec hold - Supine Quad Set  - 1 x daily - 3 x weekly - 3 sets - 10 reps - 5 hold - Seated Long Arc Quad  - 1 x daily - 7 x weekly - 3 sets - 10 reps - Seated Hip Adduction Squeeze with Ball  - 1 x daily - 3 x weekly - 3 sets - 10 reps - 5 hold - Seated Hip Abduction  - 1 x daily - 3 x weekly - 3 sets - 10 reps - 2 hold    PT Short Term Goals -       PT SHORT TERM GOAL #1   Title Pt will be independent with HEP in order to improve strength and balance in order to decrease fall risk and improve function at home and work.    Baseline 01/04/2021= No formal HEP in place; 12/12 no HEP in place; 05/10/2021-Patient and his father were able to report compliance with curent HEP consisting of mostly seated/reclined LE strengthening. Both verbalize no questions at this time.    Time 6    Period Weeks    Status Achieved    Target Date 02/15/21            PT LONG TERM GOAL #1     Title Patient will increase BLE gross strength by 1/2 muscle grade to improve functional strength for improved independence with potential gait, increased standing tolerance and increased ADL ability.     Baseline 01/04/2021- Patient presents with 1/5 to 3-/5 B LE strength with MMT; 12/12: goal partially  met for Left knee/hip; 05/10/2021= 2-/5 bilateal Hip flex; 3+/5 bilateral Knee ext; 06/21/2021= Patient presents with 2-/5 bilateral Hip flex; 3+/5 bilateral knee ext/flex; 2-/5 left ankle DF; 0/5 right ankle- and able to increase reps and resistance with LE's. 09/15/2021- Patient technically presents with 2-/5 B hip flex/abd/add - but he is able to raise his hip up to approx 100 deg which has improved. 3+/5 Bilateral knee ext, 2-/5 left ankle and 0/5 right ankle.  12/08/2021= Patient able to lift left knee at 110 deg of hip flex; presents with 3+/5 knee ext, 2-/5 left ankle DF and 0/5 right ankle DF, 2-/5  bilateral Hip abd in seated position.    12/6: R: knee 3+/5 ext, 2/5 flexion, left knee 3+/5 extension, 3+/5 flexion, R hip: 2+/5 hip add, 2+/5 hip ABD L hip: 4-/5 hip ABD, 3+/5 hip ADD, 3+/5 hip flexion; 06/06/2022= Patient now presents with 2-/5 right ankle DF/PF;     Time 12     Period Weeks     Status MET    Target Date 03/07/2022         PT LONG TERM GOAL #2    Title Patient will tolerate sitting unsupported demonstrating erect sitting posture for 15 minutes with CGA to demonstrate improved back extensor strength and improved sitting tolerance.     Baseline 01/04/2021- Patient confied to sitting in lift chair or electric power chair with back support and unable to sit upright without physical assistance; 12/12: tolerates <1 minutes upright unsupported sitting. 05/10/2021=static sit with forward trunk lean  in his power wheelchair without back support x approx 3 min. 06/21/2021=Unable to assess today due to patient with acute back pain but on previous visit able to sit x 8 min without back support. 09/15/2021- on last visit- 09/13/2021- patient was able to sit unsupported x 8 min at edge of mat. 10/13/2023 - Patient was able to sit at edge of mat with varying level of assist today from SBA to min A for a total of 20 min. 12/13/2021= Patient demonstrated unsupported sitting at edge of mat for approx 20 min    Time  12     Period Weeks     Status GOAL MET    Target Date 12/08/21          PT LONG TERM GOAL #3    Title Patient will demonstrate ability to perform static standing in // bars > 2 min with Max Assist  without loss of balance and fair posture for improved overall strength for pre-gait and transfer activities.     Baseline 01/04/2021= Patient current uanble to stand- Dependent on hoyer or sit to stand lift for transfers. 05/10/2021=Not appropriate yet- Currently still dependent with all transfers using hoyer. 06/21/2021= Patient continuing now to focus on LE strengthening to prepare for standing-unable to try today due to acute low back pain-  planning on attempting in new cert period. 09/15/2021- Patient has attempted standing 2x in past two week- max Assist of 2 people - only once was he successful to clearing his bottom from chair - Will continue to be a focus during the new certification. 12/13/2021= Patient has been limited secondary to increased overall low back pain during this certification and will require more time to focus on this goal.  12/6: not assessed this date, will assess at date when 2-3 PTs are present for assistance     Time 12     Period Weeks     Status GOAL not appropriate at this time - may attempt in future once Patient presents with improved overall LE strength.                 PT LONG TERM GOAL #4    Title Pt will improve FOTO score by 10 points or more demonstrating improved perceived functional ability     Baseline FOTO 7 on 10/17; 03/15/21: FOTO 12; 05/10/2021 06/21/2021= 1; 09/15/2021= 9; 12/13/2021= Will issue next visit 12/6: 4; 06/06/2022= Will assess next visit; Goal not appropriate as patient is not ambulatory.     Time 12     Period Weeks     Status  WITHDRAWN    Target date 11/30/2022         PT LONG TERM GOAL #5    Title Patient will perform sit to stand transfer with appropriate AD and max assist of 2 people with 75% consistency to prepare for pregait activities.      Baseline 09/15/2021= Patient unable to stand well- unable to clear his bottom off chair with Max assist of 2 persons. 12/13/2021- Goal not appropriate to try yet but will keep and roll over to next cert as shift continues to focus on transfers/standing; 4/24= Patient able to perform active ankle DF/PF with right LE and able to raise his knee into seated march and clear floor without physical assist today - as previously unable as well as lift right knee ext to near full ROM to improve strength for eventual standing. 09/07/2022- Goal continues to not be appropriate but patient is now standing some and will keep goal active; 11/23/2022= patient is now performing sit to stand with max A +2 and now able to clear his bottom off mat. 11/30/2022- Patient continues to perform sit to stand transfers with Max A+2 and able to clear bottom well off mat but not erect yet. 05/31/2023- Patient is able to stand consistently with max A +2 - unable to achieve full knee or trunk ext yet is able to clear bottom off mat for improved LE weightbearing. 09/04/2023- Patient performed several Sit to stand requiring max A+2  with improving posture- able to ext R knee better - near terminal knee and able to clear bottom off mat- still not full but able to contract glutes for standing as erect as possible.     Time 12     Period Weeks     Status PROGRESSING    Target date  11/27/2023       PT LONG TERM GOAL #6  Title Patient will tolerate sitting unsupported demonstrating erect sitting posture for 30 minutes with CGA to demonstrate improved back extensor strength and improved sitting tolerance.   Baseline 12/13/2021= Patient demonstrated unsupported sitting at edge of mat for approx 20 min;  12/29/2021- Patient performed approx 30 min of dynamic sitting activities today. 06/06/2022= Patient demonstrated ability to sit and perform static and dynamic UE/LE movement with only Supervision.   Time 12   Period Weeks   Status GOAL MET           7.   Patient will tolerate 2 minutes or more of standing in sit to stand lift or with max assist +2 in order to indicate improved lower extremity weightbearing tolerance for progression to standing in parallel bars. Baseline: 1 minute on most recent stand 02/21/22; 06/06/2022- Patient did attempt today after complaining of right LE pain last week. He attempted 3 stands using sit to stand lift. - all over 1 min- last one approx 48 sec- stopped due to fatigue. More erect standing in lift today - still poor gluteal strength but able to activate glutes and extend hips upon command but unable to hold > 5 sec. 07/28/2022- Will attempt standing next visit but patient has been able to stand since last progress note for up to 3 min at a time at his best. 09/07/2022-  attempted standing with max +2 and patient able to stand 15-20 sec so will now  revise goal; 11/23/2022- Patient at his best between last 2 visits was able to stand approx 50 sec with max A+2.  11/30/2022- Patient performed 4 trials of static standing -  max A +2- clearing bottom and using forearms to bear weight  03/15/2023: Patient performed 2 trials of static standing - max A +2- clearing edge of mat and using forearms to bear weight for approximately 45 seconds, is able to come into approximately 45 degrees of knee flexion from sit to stand; 05/31/2023- Patient has not attempted standing in 1 month but able to perform x 4 trials with max A +2 (PT and his mom) and able to clear his bottom off mat and attempted to activate his glutes and apply some forearm pressure - able to stand between 25-40 sec today. 09/04/2023- Patient performed 3 trials of standing ranging from 17 -47 sec - improving right knee terminal knee ext and more activation upon command with gluteals- able to clear mat table. Continued to require Max A+2 and blocking knees with forearm/trunk support.   Goal status: PROGRESSING Target date: 11/27/2023   8. Patient will tolerate sitting unsupported  demonstrating ability to perform dynamic UE/LE activities for 30 minutes  independently to demonstrate ability to sit at edge of bed to eat or perform some ADL's/exercise for optimal quality of life.             Baseline: 06/06/2022- Patient able to sit and perform some UE/LE exercises but requires CGA at times for safety- he is able to static sit for 30 min with supervision. 07/27/2022= Patient able to now sit > 30 min with Supervision only - performing static and dynamic activities. Will keep goal active to ensure patient is consistent. 09/07/2022=  Patient able to demonstrate consistent ability to sit at edge of mat during visits             Goal Status: MET             Target date: 08/29/2022  9.  Patient will perform rolling left to right with Mod assist for improved independence with bed mobility to assist with dressing and positioning in bed. Baseline: 09/04/2023- Mother reported last visit that he was having some difficulty with assisting with bed mobility. Goal status: NEW Target date: 11/27/2023    10. Patient will perform active bridging -able to clear bottom   Off mat for 10 reps with PT/caregiver holding his feet    For improved trunk ext strength with standing and to    To assist with be mobility/positioning.  Baseline: 09/04/2023= (PT assisted- holding right LE In neutral and pressure on each foot) - Difficulty clearing bottom off mat. Goal status: NEW Target date: 11/27/2023           Plan     Clinical Impression Statement Patient was very late to appointment today and he/mother discussed with PT that he may quality for a transport fleeta but will need a letter written by PT for reason for necessity. Spent time discussing the rationale and benefits from having a personal van and PT will address and formulate a note to assist as possible. Pt will continue to benefit from skilled physical therapy intervention to address impairments, improve QOL, and attain therapy goals.    Personal Factors and  Comorbidities Comorbidity 3+;Time since onset of injury/illness/exacerbation    Comorbidities CVA, diabetes, Seizures    Examination-Activity Limitations Bathing;Bed Mobility;Bend;Caring for Others;Carry;Dressing;Hygiene/Grooming;Lift;Locomotion Level;Reach Overhead;Self Feeding;Sit;Squat;Stairs;Stand;Transfers;Toileting    Examination-Participation Restrictions Cleaning;Community Activity;Driving;Laundry;Medication Management;Meal Prep;Occupation;Personal Finances;Shop;Yard Work;Volunteer    Stability/Clinical Decision Making Evolving/Moderate complexity    Rehab Potential Fair    PT Frequency 1-2x / week    PT Duration 12 weeks  PT Treatment/Interventions ADLs/Self Care Home Management;Cryotherapy;Electrical Stimulation;Moist Heat;Ultrasound;DME Instruction;Gait training;Stair training;Functional mobility training;Therapeutic exercise;Balance training;Patient/family education;Orthotic Fit/Training;Neuromuscular re-education;Wheelchair mobility training;Manual techniques;Passive range of motion;Dry needling;Energy conservation;Taping;Visual/perceptual remediation/compensation;Joint Manipulations    PT Next Visit Plan core strength/motor control in short-sitting, sit to stand if able; Continue with progressive LE Strengthening. Stand activities as appropriate.  Nustep and potential standing in // bars as appropriate.    PT Home Exercise Plan No changes to HEP today    Consulted and Agree with Plan of Care Patient;Family member/caregiver    Family Member Consulted             03/09/2023, 8:04 AM      8:33 AM, 11/28/23 Reyes LOISE London PT Physical Therapist - [facility]  8:33 AM 11/28/23

## 2023-11-29 ENCOUNTER — Ambulatory Visit

## 2023-11-29 ENCOUNTER — Ambulatory Visit: Admitting: Occupational Therapy

## 2023-11-29 DIAGNOSIS — M6281 Muscle weakness (generalized): Secondary | ICD-10-CM

## 2023-11-29 DIAGNOSIS — H543 Unqualified visual loss, both eyes: Secondary | ICD-10-CM | POA: Diagnosis not present

## 2023-11-29 DIAGNOSIS — R2681 Unsteadiness on feet: Secondary | ICD-10-CM | POA: Diagnosis not present

## 2023-11-29 DIAGNOSIS — R269 Unspecified abnormalities of gait and mobility: Secondary | ICD-10-CM | POA: Diagnosis not present

## 2023-11-29 DIAGNOSIS — R2689 Other abnormalities of gait and mobility: Secondary | ICD-10-CM | POA: Diagnosis not present

## 2023-11-29 DIAGNOSIS — R262 Difficulty in walking, not elsewhere classified: Secondary | ICD-10-CM | POA: Diagnosis not present

## 2023-11-29 DIAGNOSIS — I6930 Unspecified sequelae of cerebral infarction: Secondary | ICD-10-CM

## 2023-11-29 DIAGNOSIS — R278 Other lack of coordination: Secondary | ICD-10-CM | POA: Diagnosis not present

## 2023-11-29 NOTE — Therapy (Unsigned)
 OUTPATIENT PHYSICAL THERAPY TREATMENT   Patient Name: Paul Hall MRN: [id number] DOB:06-23-1995, 28 y.o., male Today's Date: 11/30/2023  PCP:  [facility] PROVIDER:  Morene Sous, PA-C   PT End of Session - 11/29/23 1713     Visit Number 175    Number of Visits 184   date corrected from most recent recert   Date for PT Re-Evaluation 11/27/23    Authorization Type [employer]/ [place] Medicaid    Authorization Time Period 01/04/21-03/29/21; Recert 03/24/2021-06/16/2021; Recert 09/15/2021- 12/08/2021; Recert 12/13/2021-03/07/2022    Progress Note Due on Visit 180    PT Start Time 1616    PT Stop Time 1704    PT Time Calculation (min) 48 min    Equipment Utilized During Treatment Gait belt    Activity Tolerance Patient tolerated treatment well    Behavior During Therapy WFL for tasks assessed/performed                               Past Medical History:  Diagnosis Date   Diabetes mellitus (HCC)    Hypertension    Stroke (HCC)    Past Surgical History:  Procedure Laterality Date   IVC FILTER PLACEMENT ([facility] HX)     LEG SURGERY     PEG TUBE PLACEMENT     TRACHEOSTOMY     Patient Active Problem List   Diagnosis Date Noted   Sepsis due to vancomycin  resistant Enterococcus species (HCC) 06/06/2019   SIRS (systemic inflammatory response syndrome) (HCC) 06/05/2019   Acute lower UTI 06/05/2019   VRE (vancomycin -resistant Enterococci) infection 06/05/2019   Anemia 06/05/2019   Skin ulcer of sacrum with necrosis of muscle (HCC)    Urinary retention    Type 2 diabetes mellitus without complication, with long-term current use of insulin  (HCC)    Tachycardia    Lower extremity edema    Acute metabolic encephalopathy    Obstructive sleep apnea    Morbid obesity with BMI of 60.0-69.9, adult (HCC)    Goals of care, counseling/discussion    Palliative care encounter    Sepsis (HCC) 04/27/2019   H/O insulin  dependent diabetes mellitus 04/27/2019   History  of CVA with residual deficit 04/27/2019   Seizure disorder (HCC) 04/27/2019   Decubitus ulcer of sacral region, stage 4 (HCC) 04/27/2019    REFERRING DIAG: Cerebral infarction, unspecified   THERAPY DIAG:  Muscle weakness (generalized)  Other lack of coordination  History of CVA with residual deficit  Difficulty in walking, not elsewhere classified  Unsteadiness on feet  Other abnormalities of gait and mobility  Abnormality of gait and mobility  Rationale for Evaluation and Treatment Rehabilitation  PERTINENT HISTORY: Anan Dapolito is a 26yoM who presents with severe weakness, quadriparesis, altered sensorium, and visual impairment s/p critical illness and prolonged hospitalization. Pt hospitalized in October 2020 with ARDS 2/2 COVID19 infection. Pt sustained a complex and lengthy hospitalization which included tracheostomy, prolonged sedation, ECMO. In this period pt sustained CVA and SDH. Pt has now been liberated from tracheostomy and G-tube. Pt has since been hospitalized for wound infection and UTI. Pt lives with parents at home, has hospital bed and left chair, hoyer lift transfers, and power WC for mobility needs. Pt needs heavy physical assistance with ADL 2/2 BUE contractures and motor dysfunction   PRECAUTIONS: Fall  SUBJECTIVE:  Patient reports still recovering from a head cold. States doing okay though and ready to work.   PAIN:  Are you having pain? No. But feeling tight.    TODAY'S TREATMENT:  - 11/29/2023     Self -Care/Home management:  Reviewed some demongraphic and subjective  info for LMN for accessibility van.  TA:  Dependent transfer from power w/c to edge of mat Patient perform static sit for a few min while positioning his feet Lateral hip/foot swing laterally (to prepare for lateral scooting) 2 x 10 ( minimal ability to move RLE) Seated knee ext with proper trunk control and no UE Support  x 15 reps each LE Dynamic upper trunk lateral weight  shifting 2 x 10 - Attempting to place more weight through forearms.  A/P weight shifting- with patient controlling distance.  X 10  Forward pulling on PT hands to activate back muscles x 10 reps      PATIENT EDUCATION: Education details: Exercise technique with VC   HOME EXERCISE PROGRAM:  Access Code: [email or web address] URL: [email or web address] Date: 03/23/2022 Prepared by: Reyes London  Exercises - Supine Bridge  - 3 x weekly - 3 sets - 10 reps - 2 hold - Supine Gluteal Sets  - 3 x weekly - 3 sets - 10 reps - 5 sec hold - Supine Quad Set  - 1 x daily - 3 x weekly - 3 sets - 10 reps - 5 hold - Seated Long Arc Quad  - 1 x daily - 7 x weekly - 3 sets - 10 reps - Seated Hip Adduction Squeeze with Ball  - 1 x daily - 3 x weekly - 3 sets - 10 reps - 5 hold - Seated Hip Abduction  - 1 x daily - 3 x weekly - 3 sets - 10 reps - 2 hold    PT Short Term Goals -       PT SHORT TERM GOAL #1   Title Pt will be independent with HEP in order to improve strength and balance in order to decrease fall risk and improve function at home and work.    Baseline 01/04/2021= No formal HEP in place; 12/12 no HEP in place; 05/10/2021-Patient and his father were able to report compliance with curent HEP consisting of mostly seated/reclined LE strengthening. Both verbalize no questions at this time.    Time 6    Period Weeks    Status Achieved    Target Date 02/15/21            PT LONG TERM GOAL #1     Title Patient will increase BLE gross strength by 1/2 muscle grade to improve functional strength for improved independence with potential gait, increased standing tolerance and increased ADL ability.     Baseline 01/04/2021- Patient presents with 1/5 to 3-/5 B LE strength with MMT; 12/12: goal partially met for Left knee/hip; 05/10/2021= 2-/5 bilateal Hip flex; 3+/5 bilateral Knee ext; 06/21/2021= Patient presents with 2-/5 bilateral Hip flex; 3+/5 bilateral knee ext/flex; 2-/5 left ankle DF;  0/5 right ankle- and able to increase reps and resistance with LE's. 09/15/2021- Patient technically presents with 2-/5 B hip flex/abd/add - but he is able to raise his hip up to approx 100 deg which has improved. 3+/5 Bilateral knee ext, 2-/5 left ankle and 0/5 right ankle.  12/08/2021= Patient able to lift left knee at 110 deg of hip flex; presents with 3+/5 knee ext, 2-/5 left ankle DF and 0/5 right ankle DF, 2-/5 bilateral Hip abd in seated position.    12/6: R: knee 3+/5 ext, 2/5 flexion, left knee 3+/5 extension, 3+/5  flexion, R hip: 2+/5 hip add, 2+/5 hip ABD L hip: 4-/5 hip ABD, 3+/5 hip ADD, 3+/5 hip flexion; 06/06/2022= Patient now presents with 2-/5 right ankle DF/PF;     Time 12     Period Weeks     Status MET    Target Date 03/07/2022         PT LONG TERM GOAL #2    Title Patient will tolerate sitting unsupported demonstrating erect sitting posture for 15 minutes with CGA to demonstrate improved back extensor strength and improved sitting tolerance.     Baseline 01/04/2021- Patient confied to sitting in lift chair or electric power chair with back support and unable to sit upright without physical assistance; 12/12: tolerates <1 minutes upright unsupported sitting. 05/10/2021=static sit with forward trunk lean  in his power wheelchair without back support x approx 3 min. 06/21/2021=Unable to assess today due to patient with acute back pain but on previous visit able to sit x 8 min without back support. 09/15/2021- on last visit- 09/13/2021- patient was able to sit unsupported x 8 min at edge of mat. 10/13/2023 - Patient was able to sit at edge of mat with varying level of assist today from SBA to min A for a total of 20 min. 12/13/2021= Patient demonstrated unsupported sitting at edge of mat for approx 20 min    Time 12     Period Weeks     Status GOAL MET    Target Date 12/08/21          PT LONG TERM GOAL #3    Title Patient will demonstrate ability to perform static standing in // bars > 2 min  with Max Assist  without loss of balance and fair posture for improved overall strength for pre-gait and transfer activities.     Baseline 01/04/2021= Patient current uanble to stand- Dependent on hoyer or sit to stand lift for transfers. 05/10/2021=Not appropriate yet- Currently still dependent with all transfers using hoyer. 06/21/2021= Patient continuing now to focus on LE strengthening to prepare for standing-unable to try today due to acute low back pain-  planning on attempting in new cert period. 09/15/2021- Patient has attempted standing 2x in past two week- max Assist of 2 people - only once was he successful to clearing his bottom from chair - Will continue to be a focus during the new certification. 12/13/2021= Patient has been limited secondary to increased overall low back pain during this certification and will require more time to focus on this goal.  12/6: not assessed this date, will assess at date when 2-3 PTs are present for assistance     Time 12     Period Weeks     Status GOAL not appropriate at this time - may attempt in future once Patient presents with improved overall LE strength.                 PT LONG TERM GOAL #4    Title Pt will improve FOTO score by 10 points or more demonstrating improved perceived functional ability     Baseline FOTO 7 on 10/17; 03/15/21: FOTO 12; 05/10/2021 06/21/2021= 1; 09/15/2021= 9; 12/13/2021= Will issue next visit 12/6: 4; 06/06/2022= Will assess next visit; Goal not appropriate as patient is not ambulatory.     Time 12     Period Weeks     Status WITHDRAWN    Target date 11/30/2022         PT LONG TERM GOAL #5  Title Patient will perform sit to stand transfer with appropriate AD and max assist of 2 people with 75% consistency to prepare for pregait activities.     Baseline 09/15/2021= Patient unable to stand well- unable to clear his bottom off chair with Max assist of 2 persons. 12/13/2021- Goal not appropriate to try yet but will keep and roll over to  next cert as shift continues to focus on transfers/standing; 4/24= Patient able to perform active ankle DF/PF with right LE and able to raise his knee into seated march and clear floor without physical assist today - as previously unable as well as lift right knee ext to near full ROM to improve strength for eventual standing. 09/07/2022- Goal continues to not be appropriate but patient is now standing some and will keep goal active; 11/23/2022= patient is now performing sit to stand with max A +2 and now able to clear his bottom off mat. 11/30/2022- Patient continues to perform sit to stand transfers with Max A+2 and able to clear bottom well off mat but not erect yet. 05/31/2023- Patient is able to stand consistently with max A +2 - unable to achieve full knee or trunk ext yet is able to clear bottom off mat for improved LE weightbearing. 09/04/2023- Patient performed several Sit to stand requiring max A+2  with improving posture- able to ext R knee better - near terminal knee and able to clear bottom off mat- still not full but able to contract glutes for standing as erect as possible.     Time 12     Period Weeks     Status PROGRESSING    Target date  11/27/2023       PT LONG TERM GOAL #6  Title Patient will tolerate sitting unsupported demonstrating erect sitting posture for 30 minutes with CGA to demonstrate improved back extensor strength and improved sitting tolerance.   Baseline 12/13/2021= Patient demonstrated unsupported sitting at edge of mat for approx 20 min;  12/29/2021- Patient performed approx 30 min of dynamic sitting activities today. 06/06/2022= Patient demonstrated ability to sit and perform static and dynamic UE/LE movement with only Supervision.   Time 12   Period Weeks   Status GOAL MET           7.  Patient will tolerate 2 minutes or more of standing in sit to stand lift or with max assist +2 in order to indicate improved lower extremity weightbearing tolerance for progression to  standing in parallel bars. Baseline: 1 minute on most recent stand 02/21/22; 06/06/2022- Patient did attempt today after complaining of right LE pain last week. He attempted 3 stands using sit to stand lift. - all over 1 min- last one approx 48 sec- stopped due to fatigue. More erect standing in lift today - still poor gluteal strength but able to activate glutes and extend hips upon command but unable to hold > 5 sec. 07/28/2022- Will attempt standing next visit but patient has been able to stand since last progress note for up to 3 min at a time at his best. 09/07/2022-  attempted standing with max +2 and patient able to stand 15-20 sec so will now  revise goal; 11/23/2022- Patient at his best between last 2 visits was able to stand approx 50 sec with max A+2.  11/30/2022- Patient performed 4 trials of static standing - max A +2- clearing bottom and using forearms to bear weight  03/15/2023: Patient performed 2 trials of static standing - max  A +2- clearing edge of mat and using forearms to bear weight for approximately 45 seconds, is able to come into approximately 45 degrees of knee flexion from sit to stand; 05/31/2023- Patient has not attempted standing in 1 month but able to perform x 4 trials with max A +2 (PT and his mom) and able to clear his bottom off mat and attempted to activate his glutes and apply some forearm pressure - able to stand between 25-40 sec today. 09/04/2023- Patient performed 3 trials of standing ranging from 17 -47 sec - improving right knee terminal knee ext and more activation upon command with gluteals- able to clear mat table. Continued to require Max A+2 and blocking knees with forearm/trunk support.   Goal status: PROGRESSING Target date: 11/27/2023   8. Patient will tolerate sitting unsupported demonstrating ability to perform dynamic UE/LE activities for 30 minutes  independently to demonstrate ability to sit at edge of bed to eat or perform some ADL's/exercise for optimal  quality of life.             Baseline: 06/06/2022- Patient able to sit and perform some UE/LE exercises but requires CGA at times for safety- he is able to static sit for 30 min with supervision. 07/27/2022= Patient able to now sit > 30 min with Supervision only - performing static and dynamic activities. Will keep goal active to ensure patient is consistent. 09/07/2022=  Patient able to demonstrate consistent ability to sit at edge of mat during visits             Goal Status: MET             Target date: 08/29/2022  9.  Patient will perform rolling left to right with Mod assist for improved independence with bed mobility to assist with dressing and positioning in bed. Baseline: 09/04/2023- Mother reported last visit that he was having some difficulty with assisting with bed mobility. Goal status: NEW Target date: 11/27/2023    10. Patient will perform active bridging -able to clear bottom   Off mat for 10 reps with PT/caregiver holding his feet    For improved trunk ext strength with standing and to    To assist with be mobility/positioning.  Baseline: 09/04/2023= (PT assisted- holding right LE In neutral and pressure on each foot) - Difficulty clearing bottom off mat. Goal status: NEW Target date: 11/27/2023           Plan     Clinical Impression Statement Patient performed well overall today- good ability to maintain trunk control while performing dynamic activities. He had a hard time swinging his right out to side in seated position yet he was able to weight shift today with more weight through Bilateral forearms. Pt will continue to benefit from skilled physical therapy intervention to address impairments, improve QOL, and attain therapy goals.    Personal Factors and Comorbidities Comorbidity 3+;Time since onset of injury/illness/exacerbation    Comorbidities CVA, diabetes, Seizures    Examination-Activity Limitations Bathing;Bed Mobility;Bend;Caring for  Others;Carry;Dressing;Hygiene/Grooming;Lift;Locomotion Level;Reach Overhead;Self Feeding;Sit;Squat;Stairs;Stand;Transfers;Toileting    Examination-Participation Restrictions Cleaning;Community Activity;Driving;Laundry;Medication Management;Meal Prep;Occupation;Personal Finances;Shop;Yard Work;Volunteer    Stability/Clinical Decision Making Evolving/Moderate complexity    Rehab Potential Fair    PT Frequency 1-2x / week    PT Duration 12 weeks    PT Treatment/Interventions ADLs/Self Care Home Management;Cryotherapy;Electrical Stimulation;Moist Heat;Ultrasound;DME Instruction;Gait training;Stair training;Functional mobility training;Therapeutic exercise;Balance training;Patient/family education;Orthotic Fit/Training;Neuromuscular re-education;Wheelchair mobility training;Manual techniques;Passive range of motion;Dry needling;Energy conservation;Taping;Visual/perceptual remediation/compensation;Joint Manipulations    PT Next Visit Plan  core strength/motor control in short-sitting, sit to stand if able; Continue with progressive LE Strengthening. Stand activities as appropriate.  Nustep and potential standing in // bars as appropriate.    PT Home Exercise Plan No changes to HEP today    Consulted and Agree with Plan of Care Patient;Family member/caregiver    Family Member Consulted             03/09/2023, 8:04 AM      1:31 PM, 11/30/23 Reyes LOISE London PT Physical Therapist - [facility]  1:31 PM 11/30/23

## 2023-11-30 NOTE — Therapy (Addendum)
 . Occupational Therapy Treatment Note    Patient Name: Paul Hall MRN: [id number] DOB:10-07-1995, 28 y.o., male Today's Date: 11/30/2023  PCP: Dr. Bertell REFERRING PROVIDER: Dr. Bertell   .   OT End of Session - 11/30/23 1023     Visit Number [id number]    Number of Visits 180    Date for OT Re-Evaluation 01/16/24    Activity Tolerance Patient tolerated treatment well    Behavior During Therapy [facility] for tasks assessed/performed                 Past Medical History:  Diagnosis Date   Diabetes mellitus (HCC)    Hypertension    Stroke [facility])    Past Surgical History:  Procedure Laterality Date   IVC FILTER PLACEMENT ([facility] HX)     LEG SURGERY     PEG TUBE PLACEMENT     TRACHEOSTOMY     Patient Active Problem List   Diagnosis Date Noted   Sepsis due to vancomycin  resistant Enterococcus species (HCC) 06/06/2019   SIRS (systemic inflammatory response syndrome) (HCC) 06/05/2019   Acute lower UTI 06/05/2019   VRE (vancomycin -resistant Enterococci) infection 06/05/2019   Anemia 06/05/2019   Skin ulcer of sacrum with necrosis of muscle (HCC)    Urinary retention    Type 2 diabetes mellitus without complication, with long-term current use of insulin  (HCC)    Tachycardia    Lower extremity edema    Acute metabolic encephalopathy    Obstructive sleep apnea    Morbid obesity with BMI of 60.0-69.9, adult (HCC)    Goals of care, counseling/discussion    Palliative care encounter    Sepsis (HCC) 04/27/2019   H/O insulin  dependent diabetes mellitus 04/27/2019   History of CVA with residual deficit 04/27/2019   Seizure disorder (HCC) 04/27/2019   Decubitus ulcer of sacral region, stage 4 (HCC) 04/27/2019   ONSET DATE: 01/2019  REFERRING DIAG: CVA/COVID-19  THERAPY DIAG:  Muscle weakness (generalized)  Rationale for Evaluation and Treatment Rehabilitation  SUBJECTIVE:    SUBJECTIVE STATEMENT:  Pt. reports that he is feeling much better today.  Pt accompanied  by: self and family member  PERTINENT HISTORY:  Pt. is a 28 y.o. male who was diagnosed with COVID-19, and CVA with resultant quadriplegia. Pt. Was then hospitalized with VRE UTI. PMHx includes: urinary retention, seizure disorder, obstructive sleep disorder, DM Type II, Morbid obesity.   PRECAUTIONS: None  WEIGHT BEARING RESTRICTIONS No  PAIN:  No reports of pain Are you having pain? 0/10  FALLS: Has patient fallen in last 6 months? No  LIVING ENVIRONMENT: Lives with: lives with their family Lives in: House/apartment Stairs: No Level Entry Has following equipment at home: Wheelchair (power) and hoyer lift, sit to stand lift  PLOF: Independent  PATIENT GOALS: To be able to engage in more daily care tasks.  OBJECTIVE:   HAND DOMINANCE: Right  ADLs:  Transfers/ambulation related to ADLs: Eating: Pt. reports being able to hold standard utensils, and is starting to engage more in self-feeding tasks, hand to mouth patterns. Pt. Reports that he does as much as he can with the task, and family assists  with the remainder of the task. Grooming: Pt. is able to initiate holding an electric toothbrush, and brush his teeth. Family assists LB Dressing: Total Assist UE dressing: Pt. is now able to reach up to actively assist with grasping , and pulling his gown down. Toileting:  Total Assist Bathing: MaxA UB, Total assist LB Tub Shower  transfers: N/A Equipment: See above    IADLs: Shopping: Relies on family to assist Light housekeeping: Total Assist Meal Prep: Total Assist Community mobility:   Medication management:  Total Assist  Financial management: N/A Handwriting: Not legible: Pt. Is able to hold a pen with the left hand, and initiate marking the page. Pt.'s eye glasses were not available  MOBILITY STATUS:  Power w/c  POSTURE COMMENTS:  Pt. Requires position changes in his power w/c  ACTIVITY TOLERANCE: Activity tolerance:  Fair  FUNCTIONAL OUTCOME MEASURES: FOTO:  40 TR score: 45  05/25/2022:   FOTO: 50 TR score: 45  07/20/22: FOTO 55  03/08/23: 51  UPPER EXTREMITY ROM     Active ROM Left eval Left  03/21/2022 Left 05/25/2022 L  07/20/22 Left 07/25/2022 Left  08/15/2022 Left  09/26/2022 Left  11/16/2022 Left 12/28/2022 Left 03/13/23 Left 05/10/23 Left 10/23/23  Shoulder flexion 119 123 125 130  136 138 130 142 144 144 148  Shoulder abduction 110 115 115 115  118 121 125 142 140 140 140  Shoulder adduction              Shoulder extension              Shoulder internal rotation              Shoulder external rotation              Elbow flexion 135(135) 145 145 145  145    145 145   Elbow extension -27(-18) -21(-20) -20(-16)  -25(-10) -23(-10) -21(-10) -20(-18) -20(-18) -20(-17) -17(-16) -12(-8)  Wrist flexion              Wrist extension 10(50) 19(50) 28(50)  30(50) 35(55) 36(55) 36(60) 40(60) 45(60) 45(60) 42(54)  Wrist ulnar deviation              Wrist radial deviation              Wrist pronation            60(74)  Wrist supination   Limited by flexor tone       10(15)  30(38)  (Blank rows = not tested)    Active ROM Right eval Right 03/21/2022 Right 05/25/2022 R  07/20/22 Right  07/25/22 Right 08/15/2022 Right 09/26/2022 Right 11/16/2022 Right 12/28/2022 Right 03/13/23 Right 05/10/23 Right 10/23/23  Shoulder flexion 106 scaption 105  Scaption 62 flexion 110 scaption 100  105 105 105 112 Scaption 123 Scaption 122  Scaption 142 Scaption  Shoulder abduction 114 90 97 100  105 117 120 124 124 122 144  Shoulder adduction              Shoulder extension              Shoulder internal rotation              Shoulder external rotation              Elbow flexion 120(130) 145 145 140  144 140 142 148 148 145   Elbow extension -45(-35) -22(-35) -28(-22)  -32(-21) -32(-28) -28(-26) -28(-28) -27(-26) -27(-40) -38(-35) -34(-26)  Wrist flexion              Wrist extension -30(10) -25(10) -10(30)  -10(20) -10(25) -20(40) -30(10) -22(34)  -22(35) -32(22) -30(-8)  Wrist ulnar deviation              Wrist radial deviation  Wrist pronation              Wrist supination   Limited by flexor tone                12/28/2022: Thumb radial abduction: Right: 12(46), Left: 40(54)  Digits: Bilateral 2nd through 5th digit: MP Hyperextension with PIP, an DIP flexion    10/23/23:  Bilateral 2nd through 5th digit MP hyperextension with PIP, and DIP flexion: Pt. has been intermittently able to initate active diigt PIP, and DIP extension following Botox during this past recertification/progress reporting period.     UPPER EXTREMITY MMT:     Left eval Left 03/21/2022 Left  05/25/2022 L 07/20/22 Left  08/15/2022 Left 09/26/2022 Left 11/16/2022 Left 12/28/2022 Left  03/13/2023 Left  05/10/23 Left 10/23/23  Shoulder flexion 3-/5 3/5 3+/5 4+/5 4+/5 4+/5 4+/5 4+/5 4+/5 4+/5 4+/5  Shoulder abduction 3-/5 3-/5 4-/5 4+/5 4+/5 4+5 4+/5 4+/5 4+/5 4+/5 4+/5  Shoulder adduction             Shoulder extension             Shoulder internal rotation             Shoulder external rotation             Elbow flexion 3/5 3+/5 4-/5 4+/5 5/5 5/5 5/5 5/5 5/5 5/5 5/5  Elbow extension 2-/5 2/5  4+/5 4+/5 4+/5  4/5 4/5 4/5 through available range 4+/5 through available range  Wrist flexion             Wrist extension 2/5 2+/5 3-/5  3-/5 3-/5 3-/5 3/5 3/5 3/5 3+/5  Wrist ulnar deviation             Wrist radial deviation             Wrist pronation             Wrist supination             (Blank rows = not tested)  Right eval Right 03/21/2022 Right  05/25/2022 R 07/20/22 Right 08/15/2022 Right 09/26/2022 Right 11/16/2022 Right 12/28/2022 Right 03/13/2023 Right 05/10/23 Right 10/23/23  Shoulder flexion 3-/5 scaption 3-/5 3-/5 3+/5 3+/5 3+/5 3/5 3/5 3/5 3+/5 4-/5  Shoulder abduction 3-/5 3-/5 3-/5 4/5 4/5 4/5 4/5 4/5 4/5 4/-5 4-/5  Shoulder adduction             Shoulder extension             Shoulder internal rotation             Shoulder  external rotation             Elbow flexion 3/5 3/5 3+/5 4/5 4+/5 4+/5 4+/5 4+/5 4+/5 4+/5 4+/5  Elbow extension 2-/5 2/5 3-/5 4+/5 4+/5 4+/5 4+/5 4-/5 4-/5 4-/5 4-/5 through available range  Wrist flexion             Wrist extension 2-/5 2-/5 2/5 2/5 2/5 2/5 2/5 2/5 2/5 2/5 2/5  Wrist ulnar deviation             Wrist radial deviation             Wrist pronation             Wrist supination               HAND FUNCTION:  Grip strength: Right: 0 lbs; Left: 0 lbs and Lateral pinch: Right: 5 lbs, Left: 2 lbs  03/21/2022: Lateral pinch: Right: 3.5 lbs, Left: 2 lbs  07/20/22: Grip strength: Right: 3 lbs; Left: 4 lbs and Lateral pinch: Right: 5 lbs, Left: 3 lbs  08/15/2022:  Grip strength: Right: 0 lbs; Left: 5 lbs and Lateral pinch: Right: 2 lbs, Left: 4 lbs  09/26/2022  Grip strength: Right: 0 lbs; Left: 8 lbs and Lateral pinch: Right: 2 lbs, Left: 5 lbs  11/16/2022  Grip strength: Right: 0 lbs; Left: 8 lbs  9/25/20204  Grip strength: Right: 0 lbs; Left: 8 lbs  Grip strength: Right: 0 lbs; Left: 8 lbs   03/08/23:  Grip strength: Right: 0 lbs without PROM, 5 lbs after PROM, Left 8 lbs  05/10/23:  Grip strength: TBD  Bilateral digit PIP/DIP flexion contractures with MP hyperextension with attempts for AROM. Pt. is able to tolerate AROM to the bilateral digits at the initial evaluation however, has a history of pain in the digits.  COORDINATION: Eval: Pt. is unable to grasp 9-hole test pegs. Pt. is able to initiate grasping larger pegs, and is able to hold a pen in the left hand.  07/20/22: 2 min 36 seconds to remove 9 pegs from 9 hole peg test - cues to locate pegs 2/2 low vision. Pt. is able to initiate grasping larger pegs on R hand and is able to hold a pen in the left hand.  08/15/2022:    Vision, and sensation limiting accuracy of 9 hole peg test results. Pt. Was able to grasp and remove vertical pegs intermittently with cues.    05/10/23: TBD  SENSATION: Light  touch: Impaired   EDEMA:  N/A  MUSCLE TONE: BUE flexor Spasticity  COGNITION Overall cognitive status: Continue to assess in functional context  VISION:   Subjective report: Pt. was not wearing glasses at the time of the initial eval.  Baseline vision: Vision is very limited. Wears glasses all the time Visual history: History of impaired vision following CVA. Pt. Has received treatment through the [facility] low vision rehabilitation program.   VISION ASSESSMENT: Impaired To be further assessed in functional context  PERCEPTION: Impaired   PRAXIS: Impaired: motor planning  OBSERVATIONS:  Pt reports being on Tramadol   TODAY'S TREATMENT 11/29/23   Therapeutic Ex.:  -BUE PROM was performed with slow prolonged gentle stretching of bilateral hands including wrist/digit flexion/extension including MCPs, PIPs, DIPs of all digits, and wrist flexion/extension, and forearm supination in conjunction with moist heat modality 2/2 flexor tone, and tightness  Therapeutic Activities:   -facilitated bilateral [facility] skills grasping 1 smooth flat discs from the therapist's hand with right hand using a lateral pinch grasp, bringing it to midline, and transferring it to the left hand, then transferring it back to the right hand from the left hand.  Pt. Progressed from  transferring a quarter  to a nickel from the right hand to the left hand with increased time to complete, and minimal visual cues requiring progressively less assist, and support.  Pt. Attempted to progress to smaller coins (Pennies, and dimes)      PATIENT EDUCATION: Education details: Bilateral hand Engineer, agricultural Person educated: Patient and Parent Education method: Explanation Education comprehension: verbalized understanding  HOME EXERCISE PROGRAM:    Incorporating  the right hand during self-feeding tasks.   GOALS: Goals reviewed with patient? Yes  SHORT TERM GOALS: Target date: 12/05/2023   To assess splint fit, and  make appropriate adjustments to promote good skin integrity through the palmar surface of the bilateral hands.  Baseline: 05/25/22: Goal  currently met, however ongoing as needs to assess splint fit arise. 03/23/2022: Pt. is wearing splints a couple of hours at night bilateral resting hand splints. 03/21/2022: Pt. is wearing splints a couple of hours at night bilateral resting hand splints. Goal status: Deferred   LONG TERM GOALS: Target date: 01/15/2024   FOTO score will Improve by 2 points for Pt. perceived improvement with the assessment specific ADL/IADL tasks.  Baseline: 03/08/23: 51; 12/28/2022: FOTO score: 56; 11/16/2022: 52 09/27/2022: 51 07/20/2022: FOTO 55 05/25/2022: FOTO score: 50,  TR score: 45 Eval: FOTO score: 40, TR score: 45 Goal status: Achieved   2.   Pt. will independently perform oral care for 100% of the task after complete set-up. Baseline: 05/10/23: Pt. Performs 90% of the task. Pt.'s mother assists with 10% of the task for thorough cleaning the hard to reach places way in the back of the oral cavity.03/08/23: not as much assist required proximally, but to adjust positioning of toothbrush; 02/13/23: Pt. Continues to require assist proximally 01/09/2023: Continue 12/28/2022:  Pt. Continues to complete 90% of the task with complete set-up, with visual cues, and occassional assist of the right elbow. 11/16/2022: Pt. Completes 90% of the task with complete set-up. 09/26/2022: Completes 90% of the task, proximal assist at times required at the elbow.  08/15/2022: Completes 90% of the task, proximal assist at times required at the elbow. 07/20/22: completes 90% of task, limited by shoulder flexion. 05/25/2022:  Pt. Is able to initiate and perform oral care for approximately 90% of the task. Complete set-up required. Assi needed only for the very back teeth. 03/23/2022: Pt. Is able to initiate and perform oral care for approximately 75% of the task. Pt. Requires assist at proximally at the elbow for  through oral care. 03/21/2022: Pt. Is able to initiate and perform oral care for approximately 75% of the task. Pt. Requires assist at proximally at the elbow for through oral care. Eval: Pt. is able to initiate using an electric toothbrush. Pt. requires assist for set-up, and assist for thoroughness, and as he Pt. fatigues. Goal status:  Partially met, Goal discontinued  3.  Pt. Will be modified independence with self-feeding for 100% using a swivel spoon, and standard fork after complete set-up Baseline: 05/10/2023: Pt. feeds self 95-100% of the meal. with occasional assist to reposition the utensil. 03/08/23: indep with cup with a handle and lid, assist to reposition eating utensils in hand;  02/13/23:  Pt. Is now able to consistently, and efficiently use a swivel spoon for eating cereal with the left hand.01/09/2023: Continue 12/28/2022: Pt. Is now feeding himself 90% of the time using a swivel spoon with the left hand, and 95% of the time with the fork. Pt. Continues to have difficulty with cutting food. 11/16/2022: 100% with finger foods, Pt. Is initiating with a swivel spoon, and universal cuff for fork use, however requires assist for consistency, and accuracy.  09/26/2022: 90% using the spoon, assist at time with the forearm motion, and grip on the utensils. 100% for finger foods.  08/15/2022:  90% using the spoon, assit at time with the forearm motion, and grip on the utensils. 100% for finger foods-independent with set-up- unsupervised.  07/20/22: self-feeds cereal using spoon 90% of task. 05/25/2022: Pt. Is able to use a spoon to scoop cereal when feeding himself cereal 85% of the time. Pt. Is able to feed himself snack/finger foods 100% of the time. Pt. Continues to work on consistency of  stabilizing a cup/mug when drinking.  Pt. Is able to grasp a water  bottle with assist initially, with assist tapering off as he drinks.03/23/2022: Pt. Is able to perform scooping cereal for 75% of the time. Pt. required  assist, and support at the left elbow, and Pt. Presents with limited forearm supination when using the spoon, and bringing it towards his mouth. Pt. Is able to use a fork to spear items, and perform the hand to mouth pattern.  03/21/2022: Pt. Is able to perform scooping cereal for 75% of the time. Pt. required assist, and support at the left elbow, and Pt. presents with limited forearm supination when using the spoon, and bringing it towards his mouth. Pt. Is able to use a fork to spear items, and perform the hand to mouth pattern.  Eval: Pt. is able to hold standard standard utensils. Pt. Performs as much of the task as he, can and has assistance for the remainder. Goal status:  Improved, Achieved  4.  Pt. will improve grasp patterns and consistently grasp 1/4 objects for ADL, and IADL tasks.  Baseline: 10/23/2023: Pt. is able to initiate grasping small objects visual cues, and increased tim, however continues to work towards grasping 1/4 objects efficiently. 08/27/23: Pt. is able to grasp flat discs, and sustain a secure hold on them while moving the UE through various planes. 08/07/23: Continue 05/31/2023: Continue 05/10/23: Pt. is consistently able to grasp 1/2 objects with visual cues. Pt. Requires assist, and increased visual cues to grasp 1/4 objects on an elevated plane.  Pt. Is unable to grasp the flat objects at the tabletop surface. Pt. Is grasping grasping small puppy vitamins.03/08/23: 1/2 objects efficiently; 02/13/2023: 1/2 objects efficiently 01/09/2023: Continue 12/28/2022: Continues to grasp 1/4 pegs with min a + visual cues, consistently grasping 1/2 objects with visual cues 11/16/2022: Continues to grasp 1/4 pegs with min a + visual cues, consistently grasping 1/2 objects with visual cues 09/26/2022: Continues to grasp 1/4 pegs with min a + visual cues, consistently grasping 1/2 objects with visual cues. 08/15/2022: grasps 1/4 pegs with min a + visual cues, consistently grasping 1/2  objects with visual cues. 07/20/22: grasps 1/4 pegs with min a + visual cues, consistently grasping 1/2 objects with visual cues. 05/25/2022: Pt. Is working on improving consistency of grasping 1/2 objects with visual cues.  03/23/2022: Pt. Is able to grasp 1 objects consistently,and continues to work on the hand patterns needed to grasp 1/2 objects.03/21/2022: Pt. Is able to grasp 1 objects consistently,and continues to work on the hand patterns needed to grasp 1/2 objects. Eval: Pt. is able to grasp 1 objects intermittently using a lateral grasp pattern. Goal status: Ongoing  5.  Pt. will independently write his name legibly with letter sizes under 1. Baseline: 05/10/23: Deferred 03/08/23: Not addressed this period; 02/13/2023: Continue 01/09/2023: Continue. 12/28/2022: Pt. is now using an IPad Pen. 11/16/2022: Pt. Continues to be able to write name with smaller letter size. Pt. Is signing name with a computer styus. Pt. Requires visual cues, and assist 09/26/2022: Pt. Continues to be able to write name with smaller letter size. Pt. Is signing name with a computer styus. Pt. Requires visual cues, and assist. 08/15/2022: Pt. Is able to write name with smaller letter size. Pt. Is signing name with a computer styus. Pt. Requires visual cues, and assist. 07/20/22: stabilizing assist to write name with 75% legibility with 2 letters. 05/25/2022: Pt. to continue to work towards formulating grasp patterns in preparation for grasping a large width pen. Pt. Requires visual cues.  03/23/2022: Pt. Is able to write his name with modA, however has difficulty with formulating letter sizes less than 2 in size with 50% legibility for the 3 letters of his name.03/21/2022: Pt. Is able to write his name with modA, however has difficulty with formulating letter sizes less than 2 in size with 50% legibility for the 3 letters of his name. Eval: Pt is able to hold a thin marker with his left hand, and formulate a line, and initiate  a circular pattern (Pt. without glasses today) Goal status: Deferred  6. Pt. Will reach up to comb/brush his hair with minA.  Baseline: 05/10/23: Pt. is able to use a pick independently with the left hand with occasional assist to the far side of the left his head. 03/08/23: Pt uses pick in L hand to reach 75% of his head; unable to reach R side of head; 02/13/2023:Pt. is able to use a hair pick with the left hand on the right side of his head, top, and back of the head. Pt. Continues to have difficulty reaching further to the right side of his head with the left. 01/09/2023: Continue 12/28/2022: Pt. Is progressing, and is now able to use a hair pick with the left hand on the right side of his head, top, and back of the head. Pt. Continues to have difficulty reaching further to the right side of his head with the left. 11/16/2022: Pt. Is able to use a hair pick for the left side of his head using his left hand. Pt. Presents with difficulty reaching to the right side of his head.09/26/2022: Pt. Is able to use a hair pick for the left side of his head using his left hand. Pt. Presents with difficulty reaching to the right side of his head. 5/13/202: 75% using a hair pick, assist to the right side of the head. 07/20/22: reaches 75% of head, assist for far R side of head, fatigues quickly. 05/25/2022: Pt. Is able to reach up with the left hand to the left side, top, and back of his head. 03/23/2022: Pt. is now able to more consistently initiate reaching up to his head with his left hand in preparation for haircare12/18/2023: Pt. is now able to more consistently initiate reaching up to his head with his left hand in preparation for haircare. Eval: Pt. is able to initiate reaching up for hair care with a long handled brush, however is unable to sustain UE's in elevation to perform the task.     Goal status: Achieved  7. Pt. Will independently navigate the w/c through his environment with minA with visual scanning, and  hand placement on the controls.  Baseline: 11/16/2022: Deferred 2/2 vision 09/26/2022: Pt. Is now navigating his w/c ouside the home, and around his block. 08/15/2022: Pt. Requires minA to setup hand on controls and MIN cues to navigate the w/c in wide spaces, requires MIN - MOD cues to navigate the w/c through more narrow doorways, and tighter turns - varies based on good vs bad vision days.07/20/22: Pt. Requires minA to setup hand on controls and MIN cues to navigate the w/c in wide spaces, requires MIN - MOD cues to navigate the w/c through more narrow doorways, and tighter turns - varies based on good vs bad vision days. 05/25/2022: Pt. Requires minA  and  cues to navigate the w/c in wide spaces, and requires Mod cues to navigate the w/c through more narrow doorways, and tighter turns. Pt. Requires max cues for scanning through  the environment, and moderate cues for hand placement on the controls.     Goal status: Deferred 2/2 vision    8. Pt. Will improve bilateral grip strength to be able to independently grasp, and pull up blankets, and linens.   Baseline: 05/10/23: Deferred 03/08/23: R grip 0-5 lbs, L grip 8 lbs; pt reports he can consistently pull up blankets and linens independently.  02/13/2023: Continue. Pt. presents with digit PIP, and DIP flexor tightness. 01/09/2023: Continue 12/28/2023: R: 0#, L: 5# Pt. Is now able to more consistently grasp and pull blankets up over him. 11/16/2022: R: 0 L: 8# Pt. Is more consistently grasping his blankets and attempting to pull them up. 09/26/2022: R: 0  L: 8# Pt. is attempting to grasp, and pull blankets up more, using mostly the left hand. 08/15/2022: Pt. has difficulty securely holding, and pulling up blankets, and linens.    Goal status:Deferred  9. Pt. will consistently actively control the releasing of blankets, covers, and linens from his hands once they are in the desired position over him. Baseline:12/28/2022: Pt. Is consistently actively able to release  linens once they are in the desired position over him. 11/16/2022: Pt. continues to improve with actively releasing blankets form his hands. 09/26/2022: Pt. is improving with actively releasing blankets form his hands. 08/15/2022: Pt. has difficulty with controlled releasing of blankets/linens from his grasp.     Goal status: Achieved    10. Pt. Will improve bilateral UE strength by 2 MM grades to assist with ADLs, and IADLs  Baseline: 10/23/2023: Right: shoulder flexion: 4-/5, abduction: 4-/5, elbow flexion: 4+/5 , extension: 4-/5 wrist extension: 2/5; Left: shoulder flexion: 4+/5, abduction: 4+/5, elbow flexion: 5/5 , extension: 4+/5  wrist extension: 3+/5  08/27/23:  Right: shoulder flexion: 3+/5, abduction: 4-/5, elbow flexion: 4+/5 , extension: 4-/5 wrist extension: 2/5; Left: shoulder flexion: 4+/5, abduction: 4+/5, elbow flexion: 5/5 , extension: 4/5  wrist extension: 3/5  08/07/23: Continue 05/31/2023: Continue 05/10/23: Right: shoulder flexion: 3+/5, abduction: 4-/5, elbow flexion: 4+/5 , extension: 4-/5 wrist extension: 2/5; Left: shoulder flexion: 4+/5, abduction: 4+/5, elbow flexion: 5/5 , extension: 4/5  wrist extension: 3/5 03/13/2023: Right: shoulder flexion: 3/5, abduction: 4/5, elbow flexion: 4+/5 , extension: 4-/5 wrist extension: 2/5; Left: shoulder flexion: 4+/5, abduction: 4+/5, elbow flexion: 5/5 , extension: 4/5  wrist extension: 3/5 03/08/23: NT this date d/t as pt arrived late; 02/13/2023: Continue 01/09/2023: Continue 12/28/2022: Right: shoulder flexion: 3/5, abduction: 4/5, elbow flexion: 4+/5 , extension: 4-/5 wrist extension: 2/5; Left: shoulder flexion: 4+/5, abduction: 4+/5, elbow flexion: 5/5 , extension: 4/5  wrist extension: 3/5 Right: shoulder flexion: 3+/5, abduction: 4/5, elbow flexion: 4+/5 , extension: 4+/5 wrist extension: 2/5; Left: shoulder flexion: 4+/5, abduction: 4+/5, elbow flexion: 5/5 , extension: 4+/5 wrist extension: 3-/5 11/16/2022: Right: shoulder flexion: 3/5,  abduction: 4/5, elbow flexion: 4+/5 , extension: 4+/5 wrist extension: 2/5; Left: shoulder flexion: 4+/5, abduction: 4+/5, elbow flexion: 5/5 , extension:  wrist extension: 3-/5 Right: shoulder flexion: 3+/5, abduction: 4/5, elbow flexion: 4+/5 , extension: 4+/5 wrist extension: 2/5; Left: shoulder flexion: 4+/5, abduction: 4+/5, elbow flexion: 5/5 , extension: 4+/5 wrist extension: 3-/5 Goal status: Ongoing  11. Pt. will improve right wrist extension by 10 degrees to initiate reaching for items in preparation for ADLs. Baseline: 10/23/23: -30(-8) 08/23/2023: R Wrist Ext: -31(-8)  08/07/23: Continue 05/31/23: Continue 05/10/23: Right wrist extension: -32(22) 03/13/23: -22(35) 03/08/23: NT this date as pt arrived late; 02/13/2023: Continue 01/09/2023: Continue 12/28/2022: -22(34) 11/16/2022: -30(10)    Goal  status: Ongoing      12. Pt. Will require donn/doff a Tank top T-shirt efficiently with supervision and full set-up.    Baseline:10/23/23: Independent doff the shirt/tank, MaxA  donning. 10/23/23: Pt. Is able to independently doff his shirt, requiring assist to lean forward and loosen the back. 08/07/23: Continue 05/31/2023: Pt. Requires maxA donning a pullover sweatshirt, Mod-maxA doffing it. T-shirt tank TBD 05/10/23: Pt. Presents with difficulty completing, and requires increased time to complete    Goal status: Ongoing    13. Pt. Will independently perform bilateral/bimanual hand tasks needed to fold towels/linens/laundry Baseline: 10/23/23: Pt. Continues to have difficulty folding laundry, and linens. 08/23/23: Pt. Continues to have difficulty using bilateral/bimanual hand tasks needed to fold towels/linens/laundry. 08/07/23: Continue 05/31/23: Continue 05/10/23:  Pt. Has difficulty folding towels/lines/laundry using bilateral hands Goal status:Ongoing  14. Pt. Will independently, and efficiently reach his right arm over the side of the chair to pet his dog.    Baseline: 08/23/2023: Pt. Is now able to reach  over the side of the armrest of his recliner chair to pet his dog.  08/07/23: Continue 05/31/2023: Continue 05/10/23: Pt. is unable to efficiently reach his right UE over the side of his recliner chair to pet his dog.    Goal status: Achieved    15. Pt. Will independently, and efficiently reach out in front of him to efficiently place items onto his table while sitting in his recliner.     Baseline: 10/23/23: Pt. Is improving with functional reaching with the left UE out in front of him to place items onto the table. Pt. has difficulty reaching out with the right hand to  place items on the table. 08/23/2023: Pt. has difficulty consistently reaching out in front of him to place items on the on a tabletop surface with the right hand. 08/07/23: Continue 2/26/225: Continue 05/10/23: Pt. has difficulty reaching out in front of him far enough to efficiently place items on the table while sitting in a recliner chair.    Goal status: Ongoing     16. Pt. Will improve bilateral hand supination by 10 degrees to be able  actively turn items in his hand to look at them independently and efficiently during ADLs/IADLs,    Baseline: 10/23/23: Right: unable, L: supination 30(38)    Goal status: New      17. Pt. Will independently, and efficiently transition between picking up finger foods for dipping, and  picking up utensils , then placing them down on the tabletop surface through 75% of the meal with minimal cues.    Baseline: Pt.requires heavy cues, and assist to grasp finger foods, and utensils, and transition between the two.    Goal status: New   ASSESSMENT:  CLINICAL IMPRESSION:  Pt. Continues to present with flexor tightness in the bilateral forearm supination with increased tightness on the right side, as well as tightness in the digit PIP/DIPs of the bilateral hands.  Pt. required increased time, however was able to utilize a lateral grasp to transfer the smooth flat 1 circular discs fromt between each of the  hands more efficiently today. Pt. Was able to progress with transferring a nickel from the right hand to the left hand in midline.  Pt. Presents with difficulty transferring a nickel from the left hand back to the right hand. Pt. Attempted transferring pennies, and dimes between each hand. Pt.'s caregivers continue to be very supportive to continue working towards improving BUE functioning, ROM, strength, motor control, [facility] skills, as  well as visual compensatory strategies in order to maximize independence with daily ADLs, and IADL functioning.     PERFORMANCE DEFICITS in functional skills including ADLs, IADLs, coordination, dexterity, proprioception, ROM, strength, pain, FMC, GMC, decreased knowledge of use of DME, and UE functional use, cognitive skills including safety awareness, and psychosocial skills including coping strategies, environmental adaptation, habits, and routines and behaviors.   IMPAIRMENTS are limiting patient from ADLs, IADLs, leisure, and social participation.   COMORBIDITIES may have co-morbidities  that affects occupational performance. Patient will benefit from skilled OT to address above impairments and improve overall function.  MODIFICATION OR ASSISTANCE TO COMPLETE EVALUATION: Maximum or significant modification of tasks or assist is necessary to complete an evaluation.  OT OCCUPATIONAL PROFILE AND HISTORY: Comprehensive assessment: Review of records and extensive additional review of physical, cognitive, psychosocial history related to current functional performance.  CLINICAL DECISION MAKING: High - multiple treatment options, significant modification of task necessary  REHAB POTENTIAL: Fair    EVALUATION COMPLEXITY: High    PLAN: OT FREQUENCY: 2 x's a week  OT DURATION:12 weeks  PLANNED INTERVENTIONS: self care/ADL training, therapeutic exercise, therapeutic activity, neuromuscular re-education, manual therapy, passive range of motion, patient/family  education, and cognitive remediation/compensation RECOMMENDED OTHER SERVICES: PT  CONSULTED AND AGREED WITH PLAN OF CARE: Patient and family member/caregiver  PLAN FOR NEXT SESSION: Review HEP   Richardson Otter, MS, OTR/L  11/30/23

## 2023-12-06 ENCOUNTER — Ambulatory Visit: Admitting: Occupational Therapy

## 2023-12-06 ENCOUNTER — Ambulatory Visit

## 2023-12-11 ENCOUNTER — Ambulatory Visit

## 2023-12-11 ENCOUNTER — Ambulatory Visit: Admitting: Occupational Therapy

## 2023-12-13 ENCOUNTER — Ambulatory Visit

## 2023-12-13 ENCOUNTER — Ambulatory Visit: Admitting: Occupational Therapy

## 2023-12-17 DIAGNOSIS — R32 Unspecified urinary incontinence: Secondary | ICD-10-CM | POA: Diagnosis not present

## 2023-12-17 DIAGNOSIS — I6389 Other cerebral infarction: Secondary | ICD-10-CM | POA: Diagnosis not present

## 2023-12-17 DIAGNOSIS — L8994 Pressure ulcer of unspecified site, stage 4: Secondary | ICD-10-CM | POA: Diagnosis not present

## 2023-12-18 ENCOUNTER — Ambulatory Visit

## 2023-12-18 ENCOUNTER — Ambulatory Visit: Admitting: Occupational Therapy

## 2023-12-18 NOTE — Therapy (Incomplete)
 OUTPATIENT PHYSICAL THERAPY NEURO TREATMENT   Patient Name: Paul Hall MRN: [id number] DOB:01-10-1996, 28 y.o., male Today's Date: 12/18/2023   PCP:  Morene Sous, PA-C  REFERRING PROVIDER: Morene Sous, PA-C  END OF SESSION:   Past Medical History:  Diagnosis Date   Diabetes mellitus (HCC)    Hypertension    Stroke [facility])    Past Surgical History:  Procedure Laterality Date   IVC FILTER PLACEMENT ([facility] HX)     LEG SURGERY     PEG TUBE PLACEMENT     TRACHEOSTOMY     Patient Active Problem List   Diagnosis Date Noted   Sepsis due to vancomycin  resistant Enterococcus species (HCC) 06/06/2019   SIRS (systemic inflammatory response syndrome) (HCC) 06/05/2019   Acute lower UTI 06/05/2019   VRE (vancomycin -resistant Enterococci) infection 06/05/2019   Anemia 06/05/2019   Skin ulcer of sacrum with necrosis of muscle (HCC)    Urinary retention    Type 2 diabetes mellitus without complication, with long-term current use of insulin  (HCC)    Tachycardia    Lower extremity edema    Acute metabolic encephalopathy    Obstructive sleep apnea    Morbid obesity with BMI of 60.0-69.9, adult (HCC)    Goals of care, counseling/discussion    Palliative care encounter    Sepsis (HCC) 04/27/2019   H/O insulin  dependent diabetes mellitus 04/27/2019   History of CVA with residual deficit 04/27/2019   Seizure disorder (HCC) 04/27/2019   Decubitus ulcer of sacral region, stage 4 (HCC) 04/27/2019    ONSET DATE: October 2020  REFERRING DIAG: Cerebral infarction, unspecified   THERAPY DIAG:  No diagnosis found.  Rationale for Evaluation and Treatment: Rehabilitation  SUBJECTIVE:                                                                                                                                                                                             SUBJECTIVE STATEMENT: *** Pt accompanied by: family member  PERTINENT HISTORY: Paul Hall is a 28yoM  who presents with severe weakness, quadriparesis, altered sensorium, and visual impairment s/p critical illness and prolonged hospitalization. Pt hospitalized in October 2020 with ARDS 2/2 COVID19 infection. Pt sustained a complex and lengthy hospitalization which included tracheostomy, prolonged sedation, ECMO. In this period pt sustained CVA and SDH. Pt has now been liberated from tracheostomy and G-tube. Pt has since been hospitalized for wound infection and UTI. Pt lives with parents at home, has hospital bed and left chair, hoyer lift transfers, and power WC for mobility needs. Pt needs heavy physical assistance with ADL 2/2 BUE contractures and motor dysfunction  PAIN:  Are you having pain? {OPRCPAIN:27236}  PRECAUTIONS: Fall  RED FLAGS: None   WEIGHT BEARING RESTRICTIONS: No  FALLS: Has patient fallen in last 6 months? No  LIVING ENVIRONMENT: Lives with: lives with their family Lives in: House/apartment Stairs: Yes: {Stairs:24000} Has following equipment at home: Wheelchair (power) and hospital bed, hoyer lift, Sit to stand lift  PLOF: Independent  PATIENT GOALS: I want to walk  OBJECTIVE:  Note: Objective measures were completed at Evaluation unless otherwise noted.  DIAGNOSTIC FINDINGS: CLINICAL DATA:  Altered mental status.  History of COVID and stroke.   EXAM: CT HEAD WITHOUT CONTRAST   TECHNIQUE: Contiguous axial images were obtained from the base of the skull through the vertex without intravenous contrast.   COMPARISON:  CT head 11/11/2012   FINDINGS: Brain: Left cerebral convexity cortical hypodensity with associated cortical calcification extending from the frontal to the parietal lobe. Extensive area of volume loss and low-density compatible with chronic infarction. This is noted on prior reports from [facility]. Probable chronic watershed infarct. Additional small areas of hypodensity in the right frontal white matter consistent with chronic ischemia.    Negative for acute infarct. No acute hemorrhage or mass. Ventricle size normal without midline shift.   Vascular: Normal arterial flow voids.   Skull: Negative   Sinuses/Orbits: Mucosal edema paranasal sinuses with air-fluid level left sphenoid sinus. Negative orbit   Other: None   IMPRESSION: Extensive watershed type infarct over the left cerebral convexity with cortical calcification. This appears chronic. No associated hemorrhage, hydrocephalus, or midline shift.   These results were called by telephone at the time of interpretation on 05/09/2019 at 3:12 pm to provider [name] , who verbally acknowledged these results.     Electronically Signed   By: [name] M.D.   On: 05/09/2019 15:13  COGNITION: Overall cognitive status: Impaired   SENSATION: Light touch: WFL  COORDINATION: Delayed with BLE  EDEMA:  None observed  MUSCLE TONE: RLE: Moderate  POSTURE: rounded shoulders and forward head  LOWER EXTREMITY ROM:     Active  Right Eval Left Eval  Hip flexion    Hip extension    Hip abduction    Hip adduction    Hip internal rotation    Hip external rotation    Knee flexion    Knee extension    Ankle dorsiflexion    Ankle plantarflexion    Ankle inversion    Ankle eversion     (Blank rows = not tested)  LOWER EXTREMITY MMT:    MMT Right Eval Left Eval  Hip flexion    Hip extension    Hip abduction    Hip adduction    Hip internal rotation    Hip external rotation    Knee flexion    Knee extension    Ankle dorsiflexion    Ankle plantarflexion    Ankle inversion    Ankle eversion    (Blank rows = not tested)  BED MOBILITY:  Findings: Rolling to Right Max A Rolling to Left Max A  TRANSFERS: Sit to stand: Max A x 2 - just able to clear gluteals off mat  Assistive device utilized: HHA x2 to stand- dependent on hoyer for actual transfers     Stand to sit: Max A  Assistive device utilized: HHA x 2        GAIT: Findings:  Non-Ambulatory  FUNCTIONAL TESTS:  None appropriate at this time  PATIENT SURVEYS:  {rehab surveys:24030}  TREATMENT DATE: 12/18/2023    PATIENT EDUCATION: Education details: *** Person educated: {Person educated:25204} Education method: {Education Method:25205} Education comprehension: {Education Comprehension:25206}  HOME EXERCISE PROGRAM: Access Code: [email or web address] URL: [email or web address] Date: 03/23/2022 Prepared by: Paul Hall   Exercises - Supine Bridge  - 3 x weekly - 3 sets - 10 reps - 2 hold - Supine Gluteal Sets  - 3 x weekly - 3 sets - 10 reps - 5 sec hold - Supine Quad Set  - 1 x daily - 3 x weekly - 3 sets - 10 reps - 5 hold - Seated Long Arc Quad  - 1 x daily - 7 x weekly - 3 sets - 10 reps - Seated Hip Adduction Squeeze with Ball  - 1 x daily - 3 x weekly - 3 sets - 10 reps - 5 hold - Seated Hip Abduction  - 1 x daily - 3 x weekly - 3 sets - 10 reps - 2 hold  GOALS: Goals reviewed with patient? Yes  SHORT TERM GOALS: Target date: ***  *** Baseline: Goal status: INITIAL  2.  *** Baseline:  Goal status: INITIAL  3.  *** Baseline:  Goal status: INITIAL  4.  *** Baseline:  Goal status: INITIAL  5.  *** Baseline:  Goal status: INITIAL  6.  *** Baseline:  Goal status: INITIAL  LONG TERM GOALS: Target date: ***-Goal were updated/revised secondary to length of chart.   Patient will perform sit to stand transfer with appropriate AD and max assist of 2 people with 75% consistency to prepare for pregait activities.  Baseline: 09/04/2023- Patient performed several Sit to stand requiring max A+2  with improving posture- able to ext R knee better - near terminal knee and able to clear bottom off mat- still not full but able to contract glutes for standing as erect as possible.  Goal status: INITIAL  2.  Patient  will tolerate 2 minutes or more of standing in sit to stand lift or with max assist +2 in order to indicate improved lower extremity weightbearing tolerance for progression to standing in parallel bars  Baseline:  09/04/2023- Patient performed 3 trials of standing ranging from 17 -47 sec - improving right knee terminal knee ext and more activation upon command with gluteals- able to clear mat table. Continued to require Max A+2 and blocking knees with forearm/trunk support.   Goal status: INITIAL  3.  Patient will perform rolling left to right with Mod assist for improved independence with bed mobility to assist with dressing and positioning in bed.  Baseline:  Goal status: INITIAL  4.  Patient will perform active bridging -able to clear bottom   Off mat for 10 reps with PT/caregiver holding his feet for improved trunk ext strength with standing and to    To assist with be mobility/positioning.  Baseline:  Goal status: INITIAL  5.  *** Baseline:  Goal status: INITIAL  6.  *** Baseline:  Goal status: INITIAL  ASSESSMENT:  CLINICAL IMPRESSION: Patient is a *** y.o. *** who was seen today for physical therapy evaluation and treatment for ***.   OBJECTIVE IMPAIRMENTS: {opptimpairments:25111}.   ACTIVITY LIMITATIONS: {activitylimitations:27494}  PARTICIPATION LIMITATIONS: {participationrestrictions:25113}  PERSONAL FACTORS: {Personal factors:25162} are also affecting patient's functional outcome.   REHAB POTENTIAL: {rehabpotential:25112}  CLINICAL DECISION MAKING: {clinical decision making:25114}  EVALUATION COMPLEXITY: {Evaluation complexity:25115}  PLAN:  PT FREQUENCY: {rehab frequency:25116}  PT DURATION: {rehab duration:25117}  PLANNED INTERVENTIONS: {rehab planned interventions:25118::97110-Therapeutic exercises,97530- Therapeutic [phone]- Neuromuscular re-education,97535- Self Rjmz,[id number]- Manual  therapy,Patient/Family education}  PLAN FOR NEXT SESSION:  ***   Paul Hall, PT 12/18/2023, 10:01 AM

## 2023-12-20 ENCOUNTER — Ambulatory Visit: Admitting: Physical Therapy

## 2023-12-20 ENCOUNTER — Ambulatory Visit: Admitting: Occupational Therapy

## 2023-12-21 DIAGNOSIS — H540X44 Blindness right eye category 4, blindness left eye category 4: Secondary | ICD-10-CM | POA: Diagnosis not present

## 2023-12-21 DIAGNOSIS — H469 Unspecified optic neuritis: Secondary | ICD-10-CM | POA: Diagnosis not present

## 2023-12-21 DIAGNOSIS — H4720 Unspecified optic atrophy: Secondary | ICD-10-CM | POA: Diagnosis not present

## 2023-12-23 DIAGNOSIS — G4733 Obstructive sleep apnea (adult) (pediatric): Secondary | ICD-10-CM | POA: Diagnosis not present

## 2023-12-24 DIAGNOSIS — G4733 Obstructive sleep apnea (adult) (pediatric): Secondary | ICD-10-CM | POA: Diagnosis not present

## 2023-12-25 ENCOUNTER — Ambulatory Visit: Admitting: Occupational Therapy

## 2023-12-25 ENCOUNTER — Ambulatory Visit: Attending: Physician Assistant

## 2023-12-25 DIAGNOSIS — M6281 Muscle weakness (generalized): Secondary | ICD-10-CM | POA: Insufficient documentation

## 2023-12-25 DIAGNOSIS — I6930 Unspecified sequelae of cerebral infarction: Secondary | ICD-10-CM | POA: Diagnosis not present

## 2023-12-25 DIAGNOSIS — R269 Unspecified abnormalities of gait and mobility: Secondary | ICD-10-CM | POA: Diagnosis not present

## 2023-12-25 DIAGNOSIS — R41841 Cognitive communication deficit: Secondary | ICD-10-CM | POA: Insufficient documentation

## 2023-12-25 DIAGNOSIS — R278 Other lack of coordination: Secondary | ICD-10-CM | POA: Insufficient documentation

## 2023-12-25 DIAGNOSIS — H543 Unqualified visual loss, both eyes: Secondary | ICD-10-CM | POA: Insufficient documentation

## 2023-12-25 DIAGNOSIS — R2689 Other abnormalities of gait and mobility: Secondary | ICD-10-CM | POA: Insufficient documentation

## 2023-12-25 DIAGNOSIS — R262 Difficulty in walking, not elsewhere classified: Secondary | ICD-10-CM | POA: Insufficient documentation

## 2023-12-25 DIAGNOSIS — R2681 Unsteadiness on feet: Secondary | ICD-10-CM | POA: Diagnosis not present

## 2023-12-25 NOTE — Therapy (Addendum)
 . Occupational Therapy Treatment Note    Patient Name: Paul Hall MRN: [id number] DOB:05-10-1995, 28 y.o., male Today's Date: 12/25/2023  PCP: Dr. Bertell REFERRING PROVIDER: Dr. Bertell   .   OT End of Session - 12/25/23 2244     Visit Number [id number]    Number of Visits 180    Date for Recertification  01/16/24    OT Start Time 1622    OT Stop Time 1700    OT Time Calculation (min) 38 min    Activity Tolerance Patient tolerated treatment well    Behavior During Therapy [facility] for tasks assessed/performed                 Past Medical History:  Diagnosis Date   Diabetes mellitus (HCC)    Hypertension    Stroke [facility])    Past Surgical History:  Procedure Laterality Date   IVC FILTER PLACEMENT ([facility] HX)     LEG SURGERY     PEG TUBE PLACEMENT     TRACHEOSTOMY     Patient Active Problem List   Diagnosis Date Noted   Sepsis due to vancomycin  resistant Enterococcus species (HCC) 06/06/2019   SIRS (systemic inflammatory response syndrome) (HCC) 06/05/2019   Acute lower UTI 06/05/2019   VRE (vancomycin -resistant Enterococci) infection 06/05/2019   Anemia 06/05/2019   Skin ulcer of sacrum with necrosis of muscle (HCC)    Urinary retention    Type 2 diabetes mellitus without complication, with long-term current use of insulin  (HCC)    Tachycardia    Lower extremity edema    Acute metabolic encephalopathy    Obstructive sleep apnea    Morbid obesity with BMI of 60.0-69.9, adult (HCC)    Goals of care, counseling/discussion    Palliative care encounter    Sepsis (HCC) 04/27/2019   H/O insulin  dependent diabetes mellitus 04/27/2019   History of CVA with residual deficit 04/27/2019   Seizure disorder (HCC) 04/27/2019   Decubitus ulcer of sacral region, stage 4 (HCC) 04/27/2019   ONSET DATE: 01/2019  REFERRING DIAG: CVA/COVID-19  THERAPY DIAG:  Muscle weakness (generalized)  Other lack of coordination  Rationale for Evaluation and Treatment  Rehabilitation  SUBJECTIVE:    SUBJECTIVE STATEMENT:  Pt. reports that he is feeling much better today.  Pt accompanied by: self and family member  PERTINENT HISTORY:  Pt. is a 28 y.o. male who was diagnosed with COVID-19, and CVA with resultant quadriplegia. Pt. Was then hospitalized with VRE UTI. PMHx includes: urinary retention, seizure disorder, obstructive sleep disorder, DM Type II, Morbid obesity.   PRECAUTIONS: None  WEIGHT BEARING RESTRICTIONS No  PAIN:  No reports of pain Are you having pain? 0/10  FALLS: Has patient fallen in last 6 months? No  LIVING ENVIRONMENT: Lives with: lives with their family Lives in: House/apartment Stairs: No Level Entry Has following equipment at home: Wheelchair (power) and hoyer lift, sit to stand lift  PLOF: Independent  PATIENT GOALS: To be able to engage in more daily care tasks.  OBJECTIVE:   HAND DOMINANCE: Right  ADLs:  Transfers/ambulation related to ADLs: Eating: Pt. reports being able to hold standard utensils, and is starting to engage more in self-feeding tasks, hand to mouth patterns. Pt. Reports that he does as much as he can with the task, and family assists  with the remainder of the task. Grooming: Pt. is able to initiate holding an electric toothbrush, and brush his teeth. Family assists LB Dressing: Total Assist UE dressing: Pt. is  now able to reach up to actively assist with grasping , and pulling his gown down. Toileting:  Total Assist Bathing: MaxA UB, Total assist LB Tub Shower transfers: N/A Equipment: See above    IADLs: Shopping: Relies on family to assist Light housekeeping: Total Assist Meal Prep: Total Assist Community mobility:   Medication management:  Total Assist  Financial management: N/A Handwriting: Not legible: Pt. Is able to hold a pen with the left hand, and initiate marking the page. Pt.'s eye glasses were not available  MOBILITY STATUS:  Power w/c  POSTURE COMMENTS:  Pt.  Requires position changes in his power w/c  ACTIVITY TOLERANCE: Activity tolerance:  Fair  FUNCTIONAL OUTCOME MEASURES: FOTO: 40 TR score: 45  05/25/2022:   FOTO: 50 TR score: 45  07/20/22: FOTO 55  03/08/23: 51  UPPER EXTREMITY ROM     Active ROM Left eval Left  03/21/2022 Left 05/25/2022 L  07/20/22 Left 07/25/2022 Left  08/15/2022 Left  09/26/2022 Left  11/16/2022 Left 12/28/2022 Left 03/13/23 Left 05/10/23 Left 10/23/23  Shoulder flexion 119 123 125 130  136 138 130 142 144 144 148  Shoulder abduction 110 115 115 115  118 121 125 142 140 140 140  Shoulder adduction              Shoulder extension              Shoulder internal rotation              Shoulder external rotation              Elbow flexion 135(135) 145 145 145  145    145 145   Elbow extension -27(-18) -21(-20) -20(-16)  -25(-10) -23(-10) -21(-10) -20(-18) -20(-18) -20(-17) -17(-16) -12(-8)  Wrist flexion              Wrist extension 10(50) 19(50) 28(50)  30(50) 35(55) 36(55) 36(60) 40(60) 45(60) 45(60) 42(54)  Wrist ulnar deviation              Wrist radial deviation              Wrist pronation            60(74)  Wrist supination   Limited by flexor tone       10(15)  30(38)  (Blank rows = not tested)    Active ROM Right eval Right 03/21/2022 Right 05/25/2022 R  07/20/22 Right  07/25/22 Right 08/15/2022 Right 09/26/2022 Right 11/16/2022 Right 12/28/2022 Right 03/13/23 Right 05/10/23 Right 10/23/23  Shoulder flexion 106 scaption 105  Scaption 62 flexion 110 scaption 100  105 105 105 112 Scaption 123 Scaption 122  Scaption 142 Scaption  Shoulder abduction 114 90 97 100  105 117 120 124 124 122 144  Shoulder adduction              Shoulder extension              Shoulder internal rotation              Shoulder external rotation              Elbow flexion 120(130) 145 145 140  144 140 142 148 148 145   Elbow extension -45(-35) -22(-35) -28(-22)  -32(-21) -32(-28) -28(-26) -28(-28) -27(-26) -27(-40)  -38(-35) -34(-26)  Wrist flexion              Wrist extension -30(10) -25(10) -10(30)  -10(20) -10(25) -20(40) -30(10) -22(34) -22(35) -32(22) -30(-8)  Wrist ulnar  deviation              Wrist radial deviation              Wrist pronation              Wrist supination   Limited by flexor tone                12/28/2022: Thumb radial abduction: Right: 12(46), Left: 40(54)  Digits: Bilateral 2nd through 5th digit: MP Hyperextension with PIP, an DIP flexion    10/23/23:  Bilateral 2nd through 5th digit MP hyperextension with PIP, and DIP flexion: Pt. has been intermittently able to initate active diigt PIP, and DIP extension following Botox during this past recertification/progress reporting period.     UPPER EXTREMITY MMT:     Left eval Left 03/21/2022 Left  05/25/2022 L 07/20/22 Left  08/15/2022 Left 09/26/2022 Left 11/16/2022 Left 12/28/2022 Left  03/13/2023 Left  05/10/23 Left 10/23/23  Shoulder flexion 3-/5 3/5 3+/5 4+/5 4+/5 4+/5 4+/5 4+/5 4+/5 4+/5 4+/5  Shoulder abduction 3-/5 3-/5 4-/5 4+/5 4+/5 4+5 4+/5 4+/5 4+/5 4+/5 4+/5  Shoulder adduction             Shoulder extension             Shoulder internal rotation             Shoulder external rotation             Elbow flexion 3/5 3+/5 4-/5 4+/5 5/5 5/5 5/5 5/5 5/5 5/5 5/5  Elbow extension 2-/5 2/5  4+/5 4+/5 4+/5  4/5 4/5 4/5 through available range 4+/5 through available range  Wrist flexion             Wrist extension 2/5 2+/5 3-/5  3-/5 3-/5 3-/5 3/5 3/5 3/5 3+/5  Wrist ulnar deviation             Wrist radial deviation             Wrist pronation             Wrist supination             (Blank rows = not tested)  Right eval Right 03/21/2022 Right  05/25/2022 R 07/20/22 Right 08/15/2022 Right 09/26/2022 Right 11/16/2022 Right 12/28/2022 Right 03/13/2023 Right 05/10/23 Right 10/23/23  Shoulder flexion 3-/5 scaption 3-/5 3-/5 3+/5 3+/5 3+/5 3/5 3/5 3/5 3+/5 4-/5  Shoulder abduction 3-/5 3-/5 3-/5 4/5 4/5 4/5 4/5 4/5 4/5  4/-5 4-/5  Shoulder adduction             Shoulder extension             Shoulder internal rotation             Shoulder external rotation             Elbow flexion 3/5 3/5 3+/5 4/5 4+/5 4+/5 4+/5 4+/5 4+/5 4+/5 4+/5  Elbow extension 2-/5 2/5 3-/5 4+/5 4+/5 4+/5 4+/5 4-/5 4-/5 4-/5 4-/5 through available range  Wrist flexion             Wrist extension 2-/5 2-/5 2/5 2/5 2/5 2/5 2/5 2/5 2/5 2/5 2/5  Wrist ulnar deviation             Wrist radial deviation             Wrist pronation             Wrist supination  HAND FUNCTION:  Grip strength: Right: 0 lbs; Left: 0 lbs and Lateral pinch: Right: 5 lbs, Left: 2 lbs  03/21/2022: Lateral pinch: Right: 3.5 lbs, Left: 2 lbs  07/20/22: Grip strength: Right: 3 lbs; Left: 4 lbs and Lateral pinch: Right: 5 lbs, Left: 3 lbs  08/15/2022:  Grip strength: Right: 0 lbs; Left: 5 lbs and Lateral pinch: Right: 2 lbs, Left: 4 lbs  09/26/2022  Grip strength: Right: 0 lbs; Left: 8 lbs and Lateral pinch: Right: 2 lbs, Left: 5 lbs  11/16/2022  Grip strength: Right: 0 lbs; Left: 8 lbs  9/25/20204  Grip strength: Right: 0 lbs; Left: 8 lbs  Grip strength: Right: 0 lbs; Left: 8 lbs   03/08/23:  Grip strength: Right: 0 lbs without PROM, 5 lbs after PROM, Left 8 lbs  05/10/23:  Grip strength: TBD  Bilateral digit PIP/DIP flexion contractures with MP hyperextension with attempts for AROM. Pt. is able to tolerate AROM to the bilateral digits at the initial evaluation however, has a history of pain in the digits.  COORDINATION: Eval: Pt. is unable to grasp 9-hole test pegs. Pt. is able to initiate grasping larger pegs, and is able to hold a pen in the left hand.  07/20/22: 2 min 36 seconds to remove 9 pegs from 9 hole peg test - cues to locate pegs 2/2 low vision. Pt. is able to initiate grasping larger pegs on R hand and is able to hold a pen in the left hand.  08/15/2022:    Vision, and sensation limiting accuracy of 9 hole peg test  results. Pt. Was able to grasp and remove vertical pegs intermittently with cues.    05/10/23: TBD  SENSATION: Light touch: Impaired   EDEMA:  N/A  MUSCLE TONE: BUE flexor Spasticity  COGNITION Overall cognitive status: Continue to assess in functional context  VISION:   Subjective report: Pt. was not wearing glasses at the time of the initial eval.  Baseline vision: Vision is very limited. Wears glasses all the time Visual history: History of impaired vision following CVA. Pt. Has received treatment through the [facility] low vision rehabilitation program.   VISION ASSESSMENT: Impaired To be further assessed in functional context  PERCEPTION: Impaired   PRAXIS: Impaired: motor planning  OBSERVATIONS:  Pt reports being on Tramadol   TODAY'S TREATMENT 12/25/23   Therapeutic Ex.:   -BUE PROM was performed with slow prolonged gentle stretching of bilateral hands including wrist/digit flexion/extension including MCPs, PIPs, DIPs of all digits, and wrist flexion/extension, and forearm supination in conjunction with moist heat modality 2/2 flexor tone, and tightness  Therapeutic Activities:   -Facilitated RUE reaching for Saebo rings, and moving them through 2 levels of horizontal rungs placed at progressively higher heights with hand over hand assist throughout the movement pattern. Pt. required assist to position, and secure each of the rings within a lateral grasp.    PATIENT EDUCATION: Education details: ROM, RUE functional reaching Person educated: Patient and Parent Education method: Explanation Education comprehension: verbalized understanding  HOME EXERCISE PROGRAM:    Incorporating  the right hand during self-feeding tasks.   GOALS: Goals reviewed with patient? Yes  SHORT TERM GOALS: Target date: 12/05/2023   To assess splint fit, and make appropriate adjustments to promote good skin integrity through the palmar surface of the bilateral hands.  Baseline: 05/25/22:  Goal currently met, however ongoing as needs to assess splint fit arise. 03/23/2022: Pt. is wearing splints a couple of hours at night bilateral resting hand splints.  03/21/2022: Pt. is wearing splints a couple of hours at night bilateral resting hand splints. Goal status: Deferred   LONG TERM GOALS: Target date: 01/15/2024   FOTO score will Improve by 2 points for Pt. perceived improvement with the assessment specific ADL/IADL tasks.  Baseline: 03/08/23: 51; 12/28/2022: FOTO score: 56; 11/16/2022: 52 09/27/2022: 51 07/20/2022: FOTO 55 05/25/2022: FOTO score: 50,  TR score: 45 Eval: FOTO score: 40, TR score: 45 Goal status: Achieved   2.   Pt. will independently perform oral care for 100% of the task after complete set-up. Baseline: 05/10/23: Pt. Performs 90% of the task. Pt.'s mother assists with 10% of the task for thorough cleaning the hard to reach places way in the back of the oral cavity.03/08/23: not as much assist required proximally, but to adjust positioning of toothbrush; 02/13/23: Pt. Continues to require assist proximally 01/09/2023: Continue 12/28/2022:  Pt. Continues to complete 90% of the task with complete set-up, with visual cues, and occassional assist of the right elbow. 11/16/2022: Pt. Completes 90% of the task with complete set-up. 09/26/2022: Completes 90% of the task, proximal assist at times required at the elbow.  08/15/2022: Completes 90% of the task, proximal assist at times required at the elbow. 07/20/22: completes 90% of task, limited by shoulder flexion. 05/25/2022:  Pt. Is able to initiate and perform oral care for approximately 90% of the task. Complete set-up required. Assi needed only for the very back teeth. 03/23/2022: Pt. Is able to initiate and perform oral care for approximately 75% of the task. Pt. Requires assist at proximally at the elbow for through oral care. 03/21/2022: Pt. Is able to initiate and perform oral care for approximately 75% of the task. Pt. Requires assist at  proximally at the elbow for through oral care. Eval: Pt. is able to initiate using an electric toothbrush. Pt. requires assist for set-up, and assist for thoroughness, and as he Pt. fatigues. Goal status:  Partially met, Goal discontinued  3.  Pt. Will be modified independence with self-feeding for 100% using a swivel spoon, and standard fork after complete set-up Baseline: 05/10/2023: Pt. feeds self 95-100% of the meal. with occasional assist to reposition the utensil. 03/08/23: indep with cup with a handle and lid, assist to reposition eating utensils in hand;  02/13/23:  Pt. Is now able to consistently, and efficiently use a swivel spoon for eating cereal with the left hand.01/09/2023: Continue 12/28/2022: Pt. Is now feeding himself 90% of the time using a swivel spoon with the left hand, and 95% of the time with the fork. Pt. Continues to have difficulty with cutting food. 11/16/2022: 100% with finger foods, Pt. Is initiating with a swivel spoon, and universal cuff for fork use, however requires assist for consistency, and accuracy.  09/26/2022: 90% using the spoon, assist at time with the forearm motion, and grip on the utensils. 100% for finger foods.  08/15/2022:  90% using the spoon, assit at time with the forearm motion, and grip on the utensils. 100% for finger foods-independent with set-up- unsupervised.  07/20/22: self-feeds cereal using spoon 90% of task. 05/25/2022: Pt. Is able to use a spoon to scoop cereal when feeding himself cereal 85% of the time. Pt. Is able to feed himself snack/finger foods 100% of the time. Pt. Continues to work on consistency of  stabilizing a cup/mug when drinking. Pt. Is able to grasp a water  bottle with assist initially, with assist tapering off as he drinks.03/23/2022: Pt. Is able to perform scooping cereal for  75% of the time. Pt. required assist, and support at the left elbow, and Pt. Presents with limited forearm supination when using the spoon, and bringing it towards his  mouth. Pt. Is able to use a fork to spear items, and perform the hand to mouth pattern.  03/21/2022: Pt. Is able to perform scooping cereal for 75% of the time. Pt. required assist, and support at the left elbow, and Pt. presents with limited forearm supination when using the spoon, and bringing it towards his mouth. Pt. Is able to use a fork to spear items, and perform the hand to mouth pattern.  Eval: Pt. is able to hold standard standard utensils. Pt. Performs as much of the task as he, can and has assistance for the remainder. Goal status:  Improved, Achieved  4.  Pt. will improve grasp patterns and consistently grasp 1/4 objects for ADL, and IADL tasks.  Baseline: 10/23/2023: Pt. is able to initiate grasping small objects visual cues, and increased tim, however continues to work towards grasping 1/4 objects efficiently. 08/27/23: Pt. is able to grasp flat discs, and sustain a secure hold on them while moving the UE through various planes. 08/07/23: Continue 05/31/2023: Continue 05/10/23: Pt. is consistently able to grasp 1/2 objects with visual cues. Pt. Requires assist, and increased visual cues to grasp 1/4 objects on an elevated plane.  Pt. Is unable to grasp the flat objects at the tabletop surface. Pt. Is grasping grasping small puppy vitamins.03/08/23: 1/2 objects efficiently; 02/13/2023: 1/2 objects efficiently 01/09/2023: Continue 12/28/2022: Continues to grasp 1/4 pegs with min a + visual cues, consistently grasping 1/2 objects with visual cues 11/16/2022: Continues to grasp 1/4 pegs with min a + visual cues, consistently grasping 1/2 objects with visual cues 09/26/2022: Continues to grasp 1/4 pegs with min a + visual cues, consistently grasping 1/2 objects with visual cues. 08/15/2022: grasps 1/4 pegs with min a + visual cues, consistently grasping 1/2 objects with visual cues. 07/20/22: grasps 1/4 pegs with min a + visual cues, consistently grasping 1/2 objects with visual cues. 05/25/2022:  Pt. Is working on improving consistency of grasping 1/2 objects with visual cues.  03/23/2022: Pt. Is able to grasp 1 objects consistently,and continues to work on the hand patterns needed to grasp 1/2 objects.03/21/2022: Pt. Is able to grasp 1 objects consistently,and continues to work on the hand patterns needed to grasp 1/2 objects. Eval: Pt. is able to grasp 1 objects intermittently using a lateral grasp pattern. Goal status: Ongoing  5.  Pt. will independently write his name legibly with letter sizes under 1. Baseline: 05/10/23: Deferred 03/08/23: Not addressed this period; 02/13/2023: Continue 01/09/2023: Continue. 12/28/2022: Pt. is now using an IPad Pen. 11/16/2022: Pt. Continues to be able to write name with smaller letter size. Pt. Is signing name with a computer styus. Pt. Requires visual cues, and assist 09/26/2022: Pt. Continues to be able to write name with smaller letter size. Pt. Is signing name with a computer styus. Pt. Requires visual cues, and assist. 08/15/2022: Pt. Is able to write name with smaller letter size. Pt. Is signing name with a computer styus. Pt. Requires visual cues, and assist. 07/20/22: stabilizing assist to write name with 75% legibility with 2 letters. 05/25/2022: Pt. to continue to work towards formulating grasp patterns in preparation for grasping a large width pen. Pt. Requires visual cues. 03/23/2022: Pt. Is able to write his name with modA, however has difficulty with formulating letter sizes less than 2 in size with 50% legibility for  the 3 letters of his name.03/21/2022: Pt. Is able to write his name with modA, however has difficulty with formulating letter sizes less than 2 in size with 50% legibility for the 3 letters of his name. Eval: Pt is able to hold a thin marker with his left hand, and formulate a line, and initiate a circular pattern (Pt. without glasses today) Goal status: Deferred  6. Pt. Will reach up to comb/brush his hair with minA.  Baseline:  05/10/23: Pt. is able to use a pick independently with the left hand with occasional assist to the far side of the left his head. 03/08/23: Pt uses pick in L hand to reach 75% of his head; unable to reach R side of head; 02/13/2023:Pt. is able to use a hair pick with the left hand on the right side of his head, top, and back of the head. Pt. Continues to have difficulty reaching further to the right side of his head with the left. 01/09/2023: Continue 12/28/2022: Pt. Is progressing, and is now able to use a hair pick with the left hand on the right side of his head, top, and back of the head. Pt. Continues to have difficulty reaching further to the right side of his head with the left. 11/16/2022: Pt. Is able to use a hair pick for the left side of his head using his left hand. Pt. Presents with difficulty reaching to the right side of his head.09/26/2022: Pt. Is able to use a hair pick for the left side of his head using his left hand. Pt. Presents with difficulty reaching to the right side of his head. 5/13/202: 75% using a hair pick, assist to the right side of the head. 07/20/22: reaches 75% of head, assist for far R side of head, fatigues quickly. 05/25/2022: Pt. Is able to reach up with the left hand to the left side, top, and back of his head. 03/23/2022: Pt. is now able to more consistently initiate reaching up to his head with his left hand in preparation for haircare12/18/2023: Pt. is now able to more consistently initiate reaching up to his head with his left hand in preparation for haircare. Eval: Pt. is able to initiate reaching up for hair care with a long handled brush, however is unable to sustain UE's in elevation to perform the task.     Goal status: Achieved  7. Pt. Will independently navigate the w/c through his environment with minA with visual scanning, and hand placement on the controls.  Baseline: 11/16/2022: Deferred 2/2 vision 09/26/2022: Pt. Is now navigating his w/c ouside the home, and around  his block. 08/15/2022: Pt. Requires minA to setup hand on controls and MIN cues to navigate the w/c in wide spaces, requires MIN - MOD cues to navigate the w/c through more narrow doorways, and tighter turns - varies based on good vs bad vision days.07/20/22: Pt. Requires minA to setup hand on controls and MIN cues to navigate the w/c in wide spaces, requires MIN - MOD cues to navigate the w/c through more narrow doorways, and tighter turns - varies based on good vs bad vision days. 05/25/2022: Pt. Requires minA  and  cues to navigate the w/c in wide spaces, and requires Mod cues to navigate the w/c through more narrow doorways, and tighter turns. Pt. Requires max cues for scanning through the environment, and moderate cues for hand placement on the controls.     Goal status: Deferred 2/2 vision    8. Pt. Will  improve bilateral grip strength to be able to independently grasp, and pull up blankets, and linens.   Baseline: 05/10/23: Deferred 03/08/23: R grip 0-5 lbs, L grip 8 lbs; pt reports he can consistently pull up blankets and linens independently.  02/13/2023: Continue. Pt. presents with digit PIP, and DIP flexor tightness. 01/09/2023: Continue 12/28/2023: R: 0#, L: 5# Pt. Is now able to more consistently grasp and pull blankets up over him. 11/16/2022: R: 0 L: 8# Pt. Is more consistently grasping his blankets and attempting to pull them up. 09/26/2022: R: 0  L: 8# Pt. is attempting to grasp, and pull blankets up more, using mostly the left hand. 08/15/2022: Pt. has difficulty securely holding, and pulling up blankets, and linens.    Goal status:Deferred  9. Pt. will consistently actively control the releasing of blankets, covers, and linens from his hands once they are in the desired position over him. Baseline:12/28/2022: Pt. Is consistently actively able to release linens once they are in the desired position over him. 11/16/2022: Pt. continues to improve with actively releasing blankets form his hands.  09/26/2022: Pt. is improving with actively releasing blankets form his hands. 08/15/2022: Pt. has difficulty with controlled releasing of blankets/linens from his grasp.     Goal status: Achieved    10. Pt. Will improve bilateral UE strength by 2 MM grades to assist with ADLs, and IADLs  Baseline: 10/23/2023: Right: shoulder flexion: 4-/5, abduction: 4-/5, elbow flexion: 4+/5 , extension: 4-/5 wrist extension: 2/5; Left: shoulder flexion: 4+/5, abduction: 4+/5, elbow flexion: 5/5 , extension: 4+/5  wrist extension: 3+/5  08/27/23:  Right: shoulder flexion: 3+/5, abduction: 4-/5, elbow flexion: 4+/5 , extension: 4-/5 wrist extension: 2/5; Left: shoulder flexion: 4+/5, abduction: 4+/5, elbow flexion: 5/5 , extension: 4/5  wrist extension: 3/5  08/07/23: Continue 05/31/2023: Continue 05/10/23: Right: shoulder flexion: 3+/5, abduction: 4-/5, elbow flexion: 4+/5 , extension: 4-/5 wrist extension: 2/5; Left: shoulder flexion: 4+/5, abduction: 4+/5, elbow flexion: 5/5 , extension: 4/5  wrist extension: 3/5 03/13/2023: Right: shoulder flexion: 3/5, abduction: 4/5, elbow flexion: 4+/5 , extension: 4-/5 wrist extension: 2/5; Left: shoulder flexion: 4+/5, abduction: 4+/5, elbow flexion: 5/5 , extension: 4/5  wrist extension: 3/5 03/08/23: NT this date d/t as pt arrived late; 02/13/2023: Continue 01/09/2023: Continue 12/28/2022: Right: shoulder flexion: 3/5, abduction: 4/5, elbow flexion: 4+/5 , extension: 4-/5 wrist extension: 2/5; Left: shoulder flexion: 4+/5, abduction: 4+/5, elbow flexion: 5/5 , extension: 4/5  wrist extension: 3/5 Right: shoulder flexion: 3+/5, abduction: 4/5, elbow flexion: 4+/5 , extension: 4+/5 wrist extension: 2/5; Left: shoulder flexion: 4+/5, abduction: 4+/5, elbow flexion: 5/5 , extension: 4+/5 wrist extension: 3-/5 11/16/2022: Right: shoulder flexion: 3/5, abduction: 4/5, elbow flexion: 4+/5 , extension: 4+/5 wrist extension: 2/5; Left: shoulder flexion: 4+/5, abduction: 4+/5, elbow flexion: 5/5 ,  extension:  wrist extension: 3-/5 Right: shoulder flexion: 3+/5, abduction: 4/5, elbow flexion: 4+/5 , extension: 4+/5 wrist extension: 2/5; Left: shoulder flexion: 4+/5, abduction: 4+/5, elbow flexion: 5/5 , extension: 4+/5 wrist extension: 3-/5 Goal status: Ongoing  11. Pt. will improve right wrist extension by 10 degrees to initiate reaching for items in preparation for ADLs. Baseline: 10/23/23: -30(-8) 08/23/2023: R Wrist Ext: -31(-8)  08/07/23: Continue 05/31/23: Continue 05/10/23: Right wrist extension: -32(22) 03/13/23: -22(35) 03/08/23: NT this date as pt arrived late; 02/13/2023: Continue 01/09/2023: Continue 12/28/2022: -22(34) 11/16/2022: -30(10)    Goal status: Ongoing      12. Pt. Will require donn/doff a Tank top T-shirt efficiently with supervision and full set-up.    Baseline:10/23/23:  Independent doff the shirt/tank, MaxA  donning. 10/23/23: Pt. Is able to independently doff his shirt, requiring assist to lean forward and loosen the back. 08/07/23: Continue 05/31/2023: Pt. Requires maxA donning a pullover sweatshirt, Mod-maxA doffing it. T-shirt tank TBD 05/10/23: Pt. Presents with difficulty completing, and requires increased time to complete    Goal status: Ongoing    13. Pt. Will independently perform bilateral/bimanual hand tasks needed to fold towels/linens/laundry Baseline: 10/23/23: Pt. Continues to have difficulty folding laundry, and linens. 08/23/23: Pt. Continues to have difficulty using bilateral/bimanual hand tasks needed to fold towels/linens/laundry. 08/07/23: Continue 05/31/23: Continue 05/10/23:  Pt. Has difficulty folding towels/lines/laundry using bilateral hands Goal status:Ongoing  14. Pt. Will independently, and efficiently reach his right arm over the side of the chair to pet his dog.    Baseline: 08/23/2023: Pt. Is now able to reach over the side of the armrest of his recliner chair to pet his dog.  08/07/23: Continue 05/31/2023: Continue 05/10/23: Pt. is unable to efficiently  reach his right UE over the side of his recliner chair to pet his dog.    Goal status: Achieved    15. Pt. Will independently, and efficiently reach out in front of him to efficiently place items onto his table while sitting in his recliner.     Baseline: 10/23/23: Pt. Is improving with functional reaching with the left UE out in front of him to place items onto the table. Pt. has difficulty reaching out with the right hand to  place items on the table. 08/23/2023: Pt. has difficulty consistently reaching out in front of him to place items on the on a tabletop surface with the right hand. 08/07/23: Continue 2/26/225: Continue 05/10/23: Pt. has difficulty reaching out in front of him far enough to efficiently place items on the table while sitting in a recliner chair.    Goal status: Ongoing     16. Pt. Will improve bilateral hand supination by 10 degrees to be able  actively turn items in his hand to look at them independently and efficiently during ADLs/IADLs,    Baseline: 10/23/23: Right: unable, L: supination 30(38)    Goal status: New      17. Pt. Will independently, and efficiently transition between picking up finger foods for dipping, and  picking up utensils , then placing them down on the tabletop surface through 75% of the meal with minimal cues.    Baseline: Pt.requires heavy cues, and assist to grasp finger foods, and utensils, and transition between the two.    Goal status: New   ASSESSMENT:  CLINICAL IMPRESSION:  Pt. has returned to therapy after having had multiple cancellations over the past month due to illness, and family health concerns. Pt. Plans to have Botox injections in November. Tolerated ROM in conjunction with moist heat, however continues to present with increased flexor tone at the bilateral hand PIP, and DIP flexors. Pt. in very motivated to work towards regaining function in the bilateral hands. Pt.'s caregivers continue to be very supportive to continue working towards  improving BUE functioning, ROM, strength, motor control, [facility] skills, as well as visual compensatory strategies in order to maximize independence with daily ADLs, and IADL functioning.     PERFORMANCE DEFICITS in functional skills including ADLs, IADLs, coordination, dexterity, proprioception, ROM, strength, pain, FMC, GMC, decreased knowledge of use of DME, and UE functional use, cognitive skills including safety awareness, and psychosocial skills including coping strategies, environmental adaptation, habits, and routines and behaviors.   IMPAIRMENTS  are limiting patient from ADLs, IADLs, leisure, and social participation.   COMORBIDITIES may have co-morbidities  that affects occupational performance. Patient will benefit from skilled OT to address above impairments and improve overall function.  MODIFICATION OR ASSISTANCE TO COMPLETE EVALUATION: Maximum or significant modification of tasks or assist is necessary to complete an evaluation.  OT OCCUPATIONAL PROFILE AND HISTORY: Comprehensive assessment: Review of records and extensive additional review of physical, cognitive, psychosocial history related to current functional performance.  CLINICAL DECISION MAKING: High - multiple treatment options, significant modification of task necessary  REHAB POTENTIAL: Fair    EVALUATION COMPLEXITY: High    PLAN: OT FREQUENCY: 2 x's a week  OT DURATION:12 weeks  PLANNED INTERVENTIONS: self care/ADL training, therapeutic exercise, therapeutic activity, neuromuscular re-education, manual therapy, passive range of motion, patient/family education, and cognitive remediation/compensation RECOMMENDED OTHER SERVICES: PT  CONSULTED AND AGREED WITH PLAN OF CARE: Patient and family member/caregiver  PLAN FOR NEXT SESSION: Review HEP   Richardson Otter, MS, OTR/L  12/25/2023

## 2023-12-25 NOTE — Therapy (Signed)
 OUTPATIENT PHYSICAL THERAPY NEURO TREATMENT/RECERT   Patient Name: Paul Hall MRN: [id number] DOB:12/24/1995, 28 y.o., male Today's Date: 12/26/2023   PCP:  Morene Sous, PA-C  REFERRING PROVIDER: Morene Sous, PA-C  END OF SESSION:  PT End of Session - 12/25/23 1551     Visit Number 176    Number of Visits 200   date corrected from most recent recert   Date for Recertification  03/18/24    Authorization Type [employer] COMM [employer]/ [place] Medicaid    Authorization Time Period 01/04/21-03/29/21; Recert 03/24/2021-06/16/2021; Recert 09/15/2021- 12/08/2021; Recert 12/13/2021-03/07/2022    Progress Note Due on Visit 180    PT Start Time 1547    PT Stop Time 1615    PT Time Calculation (min) 28 min    Equipment Utilized During Treatment Gait belt    Activity Tolerance Patient tolerated treatment well    Behavior During Therapy WFL for tasks assessed/performed          Past Medical History:  Diagnosis Date   Diabetes mellitus (HCC)    Hypertension    Stroke [facility])    Past Surgical History:  Procedure Laterality Date   IVC FILTER PLACEMENT ([facility] HX)     LEG SURGERY     PEG TUBE PLACEMENT     TRACHEOSTOMY     Patient Active Problem List   Diagnosis Date Noted   Sepsis due to vancomycin  resistant Enterococcus species (HCC) 06/06/2019   SIRS (systemic inflammatory response syndrome) (HCC) 06/05/2019   Acute lower UTI 06/05/2019   VRE (vancomycin -resistant Enterococci) infection 06/05/2019   Anemia 06/05/2019   Skin ulcer of sacrum with necrosis of muscle (HCC)    Urinary retention    Type 2 diabetes mellitus without complication, with long-term current use of insulin  (HCC)    Tachycardia    Lower extremity edema    Acute metabolic encephalopathy    Obstructive sleep apnea    Morbid obesity with BMI of 60.0-69.9, adult (HCC)    Goals of care, counseling/discussion    Palliative care encounter    Sepsis (HCC) 04/27/2019   H/O insulin  dependent diabetes mellitus 04/27/2019    History of CVA with residual deficit 04/27/2019   Seizure disorder (HCC) 04/27/2019   Decubitus ulcer of sacral region, stage 4 (HCC) 04/27/2019    ONSET DATE: October 2020  REFERRING DIAG: Cerebral infarction, unspecified   THERAPY DIAG:  Muscle weakness (generalized) - Plan: PT plan of care cert/re-cert  Other lack of coordination - Plan: PT plan of care cert/re-cert  History of CVA with residual deficit - Plan: PT plan of care cert/re-cert  Difficulty in walking, not elsewhere classified - Plan: PT plan of care cert/re-cert  Unsteadiness on feet - Plan: PT plan of care cert/re-cert  Other abnormalities of gait and mobility - Plan: PT plan of care cert/re-cert  Abnormality of gait and mobility - Plan: PT plan of care cert/re-cert  Low vision, both eyes - Plan: PT plan of care cert/re-cert  Rationale for Evaluation and Treatment: Rehabilitation  SUBJECTIVE:  SUBJECTIVE STATEMENT: Patient reports returning after extensive sickness over past 3 weeks. He states It was rough but feeling better.   Pt accompanied by: family member  PERTINENT HISTORY: Paul Hall is a 26yoM who presents with severe weakness, quadriparesis, altered sensorium, and visual impairment s/p critical illness and prolonged hospitalization. Pt hospitalized in October 2020 with ARDS 2/2 COVID19 infection. Pt sustained a complex and lengthy hospitalization which included tracheostomy, prolonged sedation, ECMO. In this period pt sustained CVA and SDH. Pt has now been liberated from tracheostomy and G-tube. Pt has since been hospitalized for wound infection and UTI. Pt lives with parents at home, has hospital bed and left chair, hoyer lift transfers, and power WC for mobility needs. Pt needs heavy physical assistance with ADL  2/2 BUE contractures and motor dysfunction   PAIN:  Are you having pain? No  PRECAUTIONS: Fall  RED FLAGS: None   WEIGHT BEARING RESTRICTIONS: No  FALLS: Has patient fallen in last 6 months? No  LIVING ENVIRONMENT: Lives with: lives with their family Lives in: House/apartment Stairs:  Has following equipment at home: Wheelchair (power) and hospital bed, hoyer lift, Sit to stand lift  PLOF: Independent  PATIENT GOALS: I want to walk  OBJECTIVE:  Note: Objective measures were completed at Evaluation unless otherwise noted.  DIAGNOSTIC FINDINGS: CLINICAL DATA:  Altered mental status.  History of COVID and stroke.   EXAM: CT HEAD WITHOUT CONTRAST   TECHNIQUE: Contiguous axial images were obtained from the base of the skull through the vertex without intravenous contrast.   COMPARISON:  CT head 11/11/2012   FINDINGS: Brain: Left cerebral convexity cortical hypodensity with associated cortical calcification extending from the frontal to the parietal lobe. Extensive area of volume loss and low-density compatible with chronic infarction. This is noted on prior reports from [facility]. Probable chronic watershed infarct. Additional small areas of hypodensity in the right frontal white matter consistent with chronic ischemia.   Negative for acute infarct. No acute hemorrhage or mass. Ventricle size normal without midline shift.   Vascular: Normal arterial flow voids.   Skull: Negative   Sinuses/Orbits: Mucosal edema paranasal sinuses with air-fluid level left sphenoid sinus. Negative orbit   Other: None   IMPRESSION: Extensive watershed type infarct over the left cerebral convexity with cortical calcification. This appears chronic. No associated hemorrhage, hydrocephalus, or midline shift.   These results were called by telephone at the time of interpretation on 05/09/2019 at 3:12 pm to provider Paul Hall , who verbally acknowledged these results.      Electronically Signed   By: Paul Hall M.D.   On: 05/09/2019 15:13  COGNITION: Overall cognitive status: Impaired   SENSATION: Light touch: WFL  COORDINATION: Delayed with BLE  EDEMA:  None observed  MUSCLE TONE: RLE: Moderate  POSTURE: rounded shoulders and forward head  LOWER EXTREMITY ROM:     Active  Right Eval Left Eval  Hip flexion    Hip extension    Hip abduction    Hip adduction    Hip internal rotation    Hip external rotation    Knee flexion    Knee extension    Ankle dorsiflexion    Ankle plantarflexion    Ankle inversion    Ankle eversion     (Blank rows = not tested)  LOWER EXTREMITY MMT:    MMT Right Eval Left Eval  Hip flexion    Hip extension    Hip abduction    Hip adduction    Hip  internal rotation    Hip external rotation    Knee flexion    Knee extension    Ankle dorsiflexion    Ankle plantarflexion    Ankle inversion    Ankle eversion    (Blank rows = not tested)  BED MOBILITY:  Findings: Rolling to Right Max A Rolling to Left Max A  TRANSFERS: Sit to stand: Max A x 2 - just able to clear gluteals off mat  Assistive device utilized: HHA x2 to stand- dependent on hoyer for actual transfers     Stand to sit: Max A  Assistive device utilized: HHA x 2        GAIT: Findings: Non-Ambulatory  FUNCTIONAL TESTS:  None appropriate at this time  PATIENT SURVEYS:                                                                                                                                TREATMENT DATE: 12/26/2023   *Treatment limited secondary to patient late for appointment and recovering from being sick past 2+weeks.   TE:  PROM stretching BLE (hips - IR/ER/Swings - CW/CCW), Knee flex/ext, ankle DF/PF/IV/EV- each LE- as patient had reported being very stiff and tight after missing several weeks of PT   PATIENT EDUCATION: Education details: Exercise technique; Plan for recert today Person educated: Patient and  Parent Education method: Explanation Education comprehension: verbalized understanding  HOME EXERCISE PROGRAM: Access Code: [email or web address] URL: [email or web address] Date: 03/23/2022 Prepared by: Reyes London   Exercises - Supine Bridge  - 3 x weekly - 3 sets - 10 reps - 2 hold - Supine Gluteal Sets  - 3 x weekly - 3 sets - 10 reps - 5 sec hold - Supine Quad Set  - 1 x daily - 3 x weekly - 3 sets - 10 reps - 5 hold - Seated Long Arc Quad  - 1 x daily - 7 x weekly - 3 sets - 10 reps - Seated Hip Adduction Squeeze with Ball  - 1 x daily - 3 x weekly - 3 sets - 10 reps - 5 hold - Seated Hip Abduction  - 1 x daily - 3 x weekly - 3 sets - 10 reps - 2 hold  GOALS: Goals reviewed with patient? Yes  SHORT TERM GOALS: Target date: 02/05/2024  Patient will be independent with updated HEP for improved overall LE strength in home Baseline: HEP continues to be updated as appropriate.  Goal status: NEW  LONG TERM GOALS: Target date: 03/18/2024 -Goal were updated/revised secondary to length of chart.   Patient will perform sit to stand transfer with appropriate AD and max assist of 2 people with 75% consistency to prepare for pregait activities.  Baseline: 09/04/2023- Patient performed several Sit to stand requiring max A+2  with improving posture- able to ext R knee better - near terminal knee and able to clear bottom off mat- still not full but able to contract  glutes for standing as erect as possible.  Goal status: ONGOING  2.  Patient will tolerate 2 minutes or more of standing in sit to stand lift or with max assist +2 in order to indicate improved lower extremity weightbearing tolerance for progression to standing in parallel bars  Baseline:  09/04/2023- Patient performed 3 trials of standing ranging from 17 -47 sec - improving right knee terminal knee ext and more activation upon command with gluteals- able to clear mat table. Continued to require Max A+2 and blocking knees with  forearm/trunk support.   Goal status: ONGOING  3.  Patient will perform rolling left to right with Mod assist for improved independence with bed mobility to assist with dressing and positioning in bed.  Baseline: Dependent on caregiver to roll to left Goal status: Ongoing  4.  Patient will perform active bridging -able to clear bottom   Off mat for 10 reps with PT/caregiver holding his feet for improved trunk ext strength with standing and to assist with mobility/positioning.  Baseline: Unable to perform 10 reps with consistent clearing of bottom Goal status: INITIAL     ASSESSMENT:  CLINICAL IMPRESSION: Patient was seen today for physical therapy recertification and treatment for cerebral infarction. Goals and documentation noted updated due to length of chart. Treatment today was very limited secondary to late arrival and patient recovering from being sick. Unable to test current goals but will attempt next visit and add more goals if appropriate. Patient remains motivated despite not feeling well to imrpove his condition and did demonstrate progress during this cert. He was able to bridge some, Roll with assistance and continues to stand some with assistance. He was able to try the Nustep which was a good step toward improving his overall UE/LE strength. Patient's condition has the potential to improve in response to therapy. Maximum improvement is yet to be obtained. The anticipated improvement is attainable and reasonable in a generally predictable time.  Pt will continue to benefit from skilled physical therapy intervention to address impairments, improve QOL, and attain therapy goals.   OBJECTIVE IMPAIRMENTS: Abnormal gait, cardiopulmonary status limiting activity, decreased activity tolerance, decreased balance, decreased cognition, decreased coordination, decreased endurance, decreased knowledge of condition, decreased knowledge of use of DME, decreased mobility, difficulty walking,  decreased ROM, decreased strength, hypomobility, impaired flexibility, impaired sensation, impaired UE functional use, impaired vision/preception, postural dysfunction, obesity, and pain.   ACTIVITY LIMITATIONS: carrying, lifting, bending, sitting, standing, squatting, stairs, transfers, bed mobility, bathing, toileting, dressing, self feeding, reach over head, and hygiene/grooming  PARTICIPATION LIMITATIONS: meal prep, cleaning, laundry, medication management, personal finances, interpersonal relationship, driving, shopping, community activity, occupation, yard work, and church  PERSONAL FACTORS: Time since onset of injury/illness/exacerbation and 3+ comorbidities: DM, CVA, Seizures are also affecting patient's functional outcome.   REHAB POTENTIAL: Good  CLINICAL DECISION MAKING: Evolving/moderate complexity  EVALUATION COMPLEXITY: Moderate  PLAN:  PT FREQUENCY: 1-2x/week  PT DURATION: 12 weeks  PLANNED INTERVENTIONS: 97110-Therapeutic exercises, 97530- Therapeutic activity, 97112- Neuromuscular re-education, 97535- Self Care, [id number]- Manual therapy, and Patient/Family education  PLAN FOR NEXT SESSION:   Resume standing as able  Attempt standing frame if appropriate Nustep for UE/LE strengthening Mat table dynamic sitting activities.    Reyes LOISE London, PT 12/26/2023, 4:16 PM

## 2023-12-27 ENCOUNTER — Ambulatory Visit: Admitting: Occupational Therapy

## 2023-12-27 ENCOUNTER — Ambulatory Visit

## 2023-12-27 DIAGNOSIS — R262 Difficulty in walking, not elsewhere classified: Secondary | ICD-10-CM | POA: Diagnosis not present

## 2023-12-27 DIAGNOSIS — R2681 Unsteadiness on feet: Secondary | ICD-10-CM

## 2023-12-27 DIAGNOSIS — H543 Unqualified visual loss, both eyes: Secondary | ICD-10-CM | POA: Diagnosis not present

## 2023-12-27 DIAGNOSIS — R278 Other lack of coordination: Secondary | ICD-10-CM | POA: Diagnosis not present

## 2023-12-27 DIAGNOSIS — R269 Unspecified abnormalities of gait and mobility: Secondary | ICD-10-CM

## 2023-12-27 DIAGNOSIS — I6930 Unspecified sequelae of cerebral infarction: Secondary | ICD-10-CM | POA: Diagnosis not present

## 2023-12-27 DIAGNOSIS — R41841 Cognitive communication deficit: Secondary | ICD-10-CM

## 2023-12-27 DIAGNOSIS — M6281 Muscle weakness (generalized): Secondary | ICD-10-CM | POA: Diagnosis not present

## 2023-12-27 DIAGNOSIS — R2689 Other abnormalities of gait and mobility: Secondary | ICD-10-CM | POA: Diagnosis not present

## 2023-12-27 NOTE — Therapy (Signed)
 . Occupational Therapy Treatment/Recertification Note    Patient Name: Paul Hall MRN: [id number] DOB:Aug 27, 1995, 28 y.o., male Today's Date: 12/27/2023  PCP: Dr. Bertell REFERRING PROVIDER: Dr. Bertell   .   OT End of Session - 12/27/23 1636     Visit Number [id number]    Number of Visits 180    Date for Recertification  03/20/24    OT Start Time 1540    OT Stop Time 1620    OT Time Calculation (min) 40 min    Equipment Utilized During Treatment tilt in space power wc    Activity Tolerance Patient tolerated treatment well    Behavior During Therapy WFL for tasks assessed/performed                  Past Medical History:  Diagnosis Date   Diabetes mellitus (HCC)    Hypertension    Stroke [facility])    Past Surgical History:  Procedure Laterality Date   IVC FILTER PLACEMENT ([facility] HX)     LEG SURGERY     PEG TUBE PLACEMENT     TRACHEOSTOMY     Patient Active Problem List   Diagnosis Date Noted   Sepsis due to vancomycin  resistant Enterococcus species (HCC) 06/06/2019   SIRS (systemic inflammatory response syndrome) (HCC) 06/05/2019   Acute lower UTI 06/05/2019   VRE (vancomycin -resistant Enterococci) infection 06/05/2019   Anemia 06/05/2019   Skin ulcer of sacrum with necrosis of muscle (HCC)    Urinary retention    Type 2 diabetes mellitus without complication, with long-term current use of insulin  (HCC)    Tachycardia    Lower extremity edema    Acute metabolic encephalopathy    Obstructive sleep apnea    Morbid obesity with BMI of 60.0-69.9, adult (HCC)    Goals of care, counseling/discussion    Palliative care encounter    Sepsis (HCC) 04/27/2019   H/O insulin  dependent diabetes mellitus 04/27/2019   History of CVA with residual deficit 04/27/2019   Seizure disorder (HCC) 04/27/2019   Decubitus ulcer of sacral region, stage 4 (HCC) 04/27/2019   ONSET DATE: 01/2019  REFERRING DIAG: CVA/COVID-19  THERAPY DIAG:  Muscle weakness (generalized)  Other  lack of coordination  Rationale for Evaluation and Treatment Rehabilitation  SUBJECTIVE:    SUBJECTIVE STATEMENT:  Pt. Is requesting heat to the bilateral hands 2/2 reporting  that his hands are sore from using the [employer] on the stretch setting.   Pt accompanied by: self and family member  PERTINENT HISTORY:  Pt. is a 28 y.o. male who was diagnosed with COVID-19, and CVA with resultant quadriplegia. Pt. Was then hospitalized with VRE UTI. PMHx includes: urinary retention, seizure disorder, obstructive sleep disorder, DM Type II, Morbid obesity.   PRECAUTIONS: None  WEIGHT BEARING RESTRICTIONS No  PAIN:  No reports of pain Are you having pain? 0/10  FALLS: Has patient fallen in last 6 months? No  LIVING ENVIRONMENT: Lives with: lives with their family Lives in: House/apartment Stairs: No Level Entry Has following equipment at home: Wheelchair (power) and hoyer lift, sit to stand lift  PLOF: Independent  PATIENT GOALS: To be able to engage in more daily care tasks.  OBJECTIVE:   HAND DOMINANCE: Right  ADLs:  Transfers/ambulation related to ADLs: Eating: Pt. reports being able to hold standard utensils, and is starting to engage more in self-feeding tasks, hand to mouth patterns. Pt. Reports that he does as much as he can with the task, and family assists  with  the remainder of the task. Grooming: Pt. is able to initiate holding an electric toothbrush, and brush his teeth. Family assists LB Dressing: Total Assist UE dressing: Pt. is now able to reach up to actively assist with grasping , and pulling his gown down. Toileting:  Total Assist Bathing: MaxA UB, Total assist LB Tub Shower transfers: N/A Equipment: See above    IADLs: Shopping: Relies on family to assist Light housekeeping: Total Assist Meal Prep: Total Assist Community mobility:   Medication management:  Total Assist  Financial management: N/A Handwriting: Not legible: Pt. Is able to hold a pen  with the left hand, and initiate marking the page. Pt.'s eye glasses were not available  MOBILITY STATUS:  Power w/c  POSTURE COMMENTS:  Pt. Requires position changes in his power w/c  ACTIVITY TOLERANCE: Activity tolerance:  Fair  FUNCTIONAL OUTCOME MEASURES: FOTO: 40 TR score: 45  05/25/2022:   FOTO: 50 TR score: 45  07/20/22: FOTO 55  03/08/23: 51  UPPER EXTREMITY ROM     Active ROM Left eval Left  03/21/2022 Left 05/25/2022 L  07/20/22 Left 07/25/2022 Left  08/15/2022 Left  09/26/2022 Left  11/16/2022 Left 12/28/2022 Left 03/13/23 Left 05/10/23 Left 10/23/23 Left 12/27/23  Shoulder flexion 119 123 125 130  136 138 130 142 144 144 148 148  Shoulder abduction 110 115 115 115  118 121 125 142 140 140 140 140  Shoulder adduction               Shoulder extension               Shoulder internal rotation               Shoulder external rotation               Elbow flexion 135(135) 145 145 145  145    145 145  145  Elbow extension -27(-18) -21(-20) -20(-16)  -25(-10) -23(-10) -21(-10) -20(-18) -20(-18) -20(-17) -17(-16) -12(-8) -12(-6)  Wrist flexion               Wrist extension 10(50) 19(50) 28(50)  30(50) 35(55) 36(55) 36(60) 40(60) 45(60) 45(60) 42(54) 36(54)  Wrist ulnar deviation               Wrist radial deviation               Wrist pronation            60(74) 84(84)  Wrist supination   Limited by flexor tone       10(15)  30(38) 20(38)  (Blank rows = not tested)    Active ROM Right eval Right 03/21/2022 Right 05/25/2022 R  07/20/22 Right  07/25/22 Right 08/15/2022 Right 09/26/2022 Right 11/16/2022 Right 12/28/2022 Right 03/13/23 Right 05/10/23 Right 10/23/23 Right  9/24 /25  Shoulder flexion 106 scaption 105  Scaption 62 flexion 110 scaption 100  105 105 105 112 Scaption 123 Scaption 122  Scaption 142 Scaption 142  Shoulder abduction 114 90 97 100  105 117 120 124 124 122 144 146  Shoulder adduction               Shoulder extension               Shoulder  internal rotation               Shoulder external rotation               Elbow flexion 120(130) 145 145 140  144 140 142 148 148 145  145  Elbow extension -45(-35) -22(-35) -28(-22)  -32(-21) -32(-28) -28(-26) -28(-28) -27(-26) -27(-40) -38(-35) -34(-26) -34(-26)  Wrist flexion               Wrist extension -30(10) -25(10) -10(30)  -10(20) -10(25) -20(40) -30(10) -22(34) -22(35) -32(22) -30(-8) -38(-6)  Wrist ulnar deviation               Wrist radial deviation               Wrist pronation               Wrist supination   Limited by flexor tone                 12/28/2022: Thumb radial abduction: Right: 12(46), Left: 40(54)  Digits: Bilateral 2nd through 5th digit: MP Hyperextension with PIP, an DIP flexion    10/23/23:  Bilateral 2nd through 5th digit MP hyperextension with PIP, and DIP flexion: Pt. has been intermittently able to initate active diigt PIP, and DIP extension following Botox during this past recertification/progress reporting period.     UPPER EXTREMITY MMT:     Left eval Left 03/21/2022 Left  05/25/2022 L 07/20/22 Left  08/15/2022 Left 09/26/2022 Left 11/16/2022 Left 12/28/2022 Left  03/13/2023 Left  05/10/23 Left 10/23/23 Left 12/27/23  Shoulder flexion 3-/5 3/5 3+/5 4+/5 4+/5 4+/5 4+/5 4+/5 4+/5 4+/5 4+/5 4+/5  Shoulder abduction 3-/5 3-/5 4-/5 4+/5 4+/5 4+5 4+/5 4+/5 4+/5 4+/5 4+/5 4+/5  Shoulder adduction              Shoulder extension              Shoulder internal rotation              Shoulder external rotation              Elbow flexion 3/5 3+/5 4-/5 4+/5 5/5 5/5 5/5 5/5 5/5 5/5 5/5 5/5  Elbow extension 2-/5 2/5  4+/5 4+/5 4+/5  4/5 4/5 4/5 through available range 4+/5 through available range 4+/5  Wrist flexion              Wrist extension 2/5 2+/5 3-/5  3-/5 3-/5 3-/5 3/5 3/5 3/5 3+/5 3-/5  Wrist ulnar deviation              Wrist radial deviation              Wrist pronation              Wrist supination              (Blank rows = not tested)   Right eval Right 03/21/2022 Right  05/25/2022 R 07/20/22 Right 08/15/2022 Right 09/26/2022 Right 11/16/2022 Right 12/28/2022 Right 03/13/2023 Right 05/10/23 Right 10/23/23 Right 12/27/23  Shoulder flexion 3-/5 scaption 3-/5 3-/5 3+/5 3+/5 3+/5 3/5 3/5 3/5 3+/5 4-/5 4/5  Shoulder abduction 3-/5 3-/5 3-/5 4/5 4/5 4/5 4/5 4/5 4/5 4/-5 4-/5 4/5  Shoulder adduction              Shoulder extension              Shoulder internal rotation              Shoulder external rotation              Elbow flexion 3/5 3/5 3+/5 4/5 4+/5 4+/5 4+/5 4+/5 4+/5 4+/5 4+/5 5/5  Elbow extension 2-/5 2/5 3-/5 4+/5 4+/5 4+/5  4+/5 4-/5 4-/5 4-/5 4-/5 through available range 3-/5  Wrist flexion              Wrist extension 2-/5 2-/5 2/5 2/5 2/5 2/5 2/5 2/5 2/5 2/5 2/5 2-/5  Wrist ulnar deviation              Wrist radial deviation              Wrist pronation              Wrist supination                HAND FUNCTION:  Grip strength: Right: 0 lbs; Left: 0 lbs and Lateral pinch: Right: 5 lbs, Left: 2 lbs  03/21/2022: Lateral pinch: Right: 3.5 lbs, Left: 2 lbs  07/20/22: Grip strength: Right: 3 lbs; Left: 4 lbs and Lateral pinch: Right: 5 lbs, Left: 3 lbs  08/15/2022:  Grip strength: Right: 0 lbs; Left: 5 lbs and Lateral pinch: Right: 2 lbs, Left: 4 lbs  09/26/2022  Grip strength: Right: 0 lbs; Left: 8 lbs and Lateral pinch: Right: 2 lbs, Left: 5 lbs  11/16/2022  Grip strength: Right: 0 lbs; Left: 8 lbs  9/25/20204  Grip strength: Right: 0 lbs; Left: 8 lbs  Grip strength: Right: 0 lbs; Left: 8 lbs   03/08/23:  Grip strength: Right: 0 lbs without PROM, 5 lbs after PROM, Left 8 lbs  05/10/23:  Grip strength: TBD  Bilateral digit PIP/DIP flexion contractures with MP hyperextension with attempts for AROM. Pt. is able to tolerate AROM to the bilateral digits at the initial evaluation however, has a history of pain in the digits.  COORDINATION: Eval: Pt. is unable to grasp 9-hole test pegs. Pt. is able to  initiate grasping larger pegs, and is able to hold a pen in the left hand.  07/20/22: 2 min 36 seconds to remove 9 pegs from 9 hole peg test - cues to locate pegs 2/2 low vision. Pt. is able to initiate grasping larger pegs on R hand and is able to hold a pen in the left hand.  08/15/2022:    Vision, and sensation limiting accuracy of 9 hole peg test results. Pt. Was able to grasp and remove vertical pegs intermittently with cues.    05/10/23: TBD  SENSATION: Light touch: Impaired   EDEMA:  N/A  MUSCLE TONE: BUE flexor Spasticity  COGNITION Overall cognitive status: Continue to assess in functional context  VISION:   Subjective report: Pt. was not wearing glasses at the time of the initial eval.  Baseline vision: Vision is very limited. Wears glasses all the time Visual history: History of impaired vision following CVA. Pt. Has received treatment through the [facility] low vision rehabilitation program.   VISION ASSESSMENT: Impaired To be further assessed in functional context  PERCEPTION: Impaired   PRAXIS: Impaired: motor planning  OBSERVATIONS:  Pt reports being on Tramadol   TODAY'S TREATMENT 12/25/23  Measurements were obtained, and goals were reviewed with the Pt. and caregiver    PATIENT EDUCATION: Education details: ROM, RUE functional reaching Person educated: Patient and Parent Education method: Explanation Education comprehension: verbalized understanding  HOME EXERCISE PROGRAM:    Incorporating  the right hand during self-feeding tasks.   GOALS: Goals reviewed with patient? Yes  SHORT TERM GOALS: Target date: 12/05/2023   To assess splint fit, and make appropriate adjustments to promote good skin integrity through the palmar surface of the bilateral hands.  Baseline: 05/25/22: Goal currently met, however ongoing  as needs to assess splint fit arise. 03/23/2022: Pt. is wearing splints a couple of hours at night bilateral resting hand splints. 03/21/2022: Pt. is  wearing splints a couple of hours at night bilateral resting hand splints. Goal status: Deferred   LONG TERM GOALS: Target date: 03/20/2024   FOTO score will Improve by 2 points for Pt. perceived improvement with the assessment specific ADL/IADL tasks.  Baseline: 03/08/23: 51; 12/28/2022: FOTO score: 56; 11/16/2022: 52 09/27/2022: 51 07/20/2022: FOTO 55 05/25/2022: FOTO score: 50,  TR score: 45 Eval: FOTO score: 40, TR score: 45 Goal status: Achieved   2.   Pt. will independently perform oral care for 100% of the task after complete set-up. Baseline: 05/10/23: Pt. Performs 90% of the task. Pt.'s mother assists with 10% of the task for thorough cleaning the hard to reach places way in the back of the oral cavity.03/08/23: not as much assist required proximally, but to adjust positioning of toothbrush; 02/13/23: Pt. Continues to require assist proximally 01/09/2023: Continue 12/28/2022:  Pt. Continues to complete 90% of the task with complete set-up, with visual cues, and occassional assist of the right elbow. 11/16/2022: Pt. Completes 90% of the task with complete set-up. 09/26/2022: Completes 90% of the task, proximal assist at times required at the elbow.  08/15/2022: Completes 90% of the task, proximal assist at times required at the elbow. 07/20/22: completes 90% of task, limited by shoulder flexion. 05/25/2022:  Pt. Is able to initiate and perform oral care for approximately 90% of the task. Complete set-up required. Assi needed only for the very back teeth. 03/23/2022: Pt. Is able to initiate and perform oral care for approximately 75% of the task. Pt. Requires assist at proximally at the elbow for through oral care. 03/21/2022: Pt. Is able to initiate and perform oral care for approximately 75% of the task. Pt. Requires assist at proximally at the elbow for through oral care. Eval: Pt. is able to initiate using an electric toothbrush. Pt. requires assist for set-up, and assist for thoroughness, and as he Pt.  fatigues. Goal status:  Partially met, Goal discontinued  3.  Pt. Will be modified independence with self-feeding for 100% using a swivel spoon, and standard fork after complete set-up Baseline: 05/10/2023: Pt. feeds self 95-100% of the meal. with occasional assist to reposition the utensil. 03/08/23: indep with cup with a handle and lid, assist to reposition eating utensils in hand;  02/13/23:  Pt. Is now able to consistently, and efficiently use a swivel spoon for eating cereal with the left hand.01/09/2023: Continue 12/28/2022: Pt. Is now feeding himself 90% of the time using a swivel spoon with the left hand, and 95% of the time with the fork. Pt. Continues to have difficulty with cutting food. 11/16/2022: 100% with finger foods, Pt. Is initiating with a swivel spoon, and universal cuff for fork use, however requires assist for consistency, and accuracy.  09/26/2022: 90% using the spoon, assist at time with the forearm motion, and grip on the utensils. 100% for finger foods.  08/15/2022:  90% using the spoon, assit at time with the forearm motion, and grip on the utensils. 100% for finger foods-independent with set-up- unsupervised.  07/20/22: self-feeds cereal using spoon 90% of task. 05/25/2022: Pt. Is able to use a spoon to scoop cereal when feeding himself cereal 85% of the time. Pt. Is able to feed himself snack/finger foods 100% of the time. Pt. Continues to work on consistency of  stabilizing a cup/mug when drinking. Pt. Is able  to grasp a water  bottle with assist initially, with assist tapering off as he drinks.03/23/2022: Pt. Is able to perform scooping cereal for 75% of the time. Pt. required assist, and support at the left elbow, and Pt. Presents with limited forearm supination when using the spoon, and bringing it towards his mouth. Pt. Is able to use a fork to spear items, and perform the hand to mouth pattern.  03/21/2022: Pt. Is able to perform scooping cereal for 75% of the time. Pt. required assist,  and support at the left elbow, and Pt. presents with limited forearm supination when using the spoon, and bringing it towards his mouth. Pt. Is able to use a fork to spear items, and perform the hand to mouth pattern.  Eval: Pt. is able to hold standard standard utensils. Pt. Performs as much of the task as he, can and has assistance for the remainder. Goal status:  Improved, Achieved  4.  Pt. will improve grasp patterns and consistently grasp 1/4 objects for ADL, and IADL tasks.  Baseline: 12/27/23: Pt. is able to present with increased tightness  2/2 being way overdue for Botox injections, which limits consistency with grasping small objects. 10/23/2023: Pt. is able to initiate grasping small objects visual cues, and increased time, however continues to work towards grasping 1/4 objects efficiently. 08/27/23: Pt. is able to grasp flat discs, and sustain a secure hold on them while moving the UE through various planes. 08/07/23: Continue 05/31/2023: Continue 05/10/23: Pt. is consistently able to grasp 1/2 objects with visual cues. Pt. Requires assist, and increased visual cues to grasp 1/4 objects on an elevated plane.  Pt. Is unable to grasp the flat objects at the tabletop surface. Pt. Is grasping grasping small puppy vitamins.03/08/23: 1/2 objects efficiently; 02/13/2023: 1/2 objects efficiently 01/09/2023: Continue 12/28/2022: Continues to grasp 1/4 pegs with min a + visual cues, consistently grasping 1/2 objects with visual cues 11/16/2022: Continues to grasp 1/4 pegs with min a + visual cues, consistently grasping 1/2 objects with visual cues 09/26/2022: Continues to grasp 1/4 pegs with min a + visual cues, consistently grasping 1/2 objects with visual cues. 08/15/2022: grasps 1/4 pegs with min a + visual cues, consistently grasping 1/2 objects with visual cues. 07/20/22: grasps 1/4 pegs with min a + visual cues, consistently grasping 1/2 objects with visual cues. 05/25/2022: Pt. Is working on  improving consistency of grasping 1/2 objects with visual cues.  03/23/2022: Pt. Is able to grasp 1 objects consistently,and continues to work on the hand patterns needed to grasp 1/2 objects.03/21/2022: Pt. Is able to grasp 1 objects consistently,and continues to work on the hand patterns needed to grasp 1/2 objects. Eval: Pt. is able to grasp 1 objects intermittently using a lateral grasp pattern. Goal status: Ongoing  5.  Pt. will independently write his name legibly with letter sizes under 1. Baseline: 05/10/23: Deferred 03/08/23: Not addressed this period; 02/13/2023: Continue 01/09/2023: Continue. 12/28/2022: Pt. is now using an IPad Pen. 11/16/2022: Pt. Continues to be able to write name with smaller letter size. Pt. Is signing name with a computer styus. Pt. Requires visual cues, and assist 09/26/2022: Pt. Continues to be able to write name with smaller letter size. Pt. Is signing name with a computer styus. Pt. Requires visual cues, and assist. 08/15/2022: Pt. Is able to write name with smaller letter size. Pt. Is signing name with a computer styus. Pt. Requires visual cues, and assist. 07/20/22: stabilizing assist to write name with 75% legibility with 2 letters. 05/25/2022:  Pt. to continue to work towards formulating grasp patterns in preparation for grasping a large width pen. Pt. Requires visual cues. 03/23/2022: Pt. Is able to write his name with modA, however has difficulty with formulating letter sizes less than 2 in size with 50% legibility for the 3 letters of his name.03/21/2022: Pt. Is able to write his name with modA, however has difficulty with formulating letter sizes less than 2 in size with 50% legibility for the 3 letters of his name. Eval: Pt is able to hold a thin marker with his left hand, and formulate a line, and initiate a circular pattern (Pt. without glasses today) Goal status: Deferred  6. Pt. Will reach up to comb/brush his hair with minA.  Baseline: 05/10/23: Pt. is able  to use a pick independently with the left hand with occasional assist to the far side of the left his head. 03/08/23: Pt uses pick in L hand to reach 75% of his head; unable to reach R side of head; 02/13/2023:Pt. is able to use a hair pick with the left hand on the right side of his head, top, and back of the head. Pt. Continues to have difficulty reaching further to the right side of his head with the left. 01/09/2023: Continue 12/28/2022: Pt. Is progressing, and is now able to use a hair pick with the left hand on the right side of his head, top, and back of the head. Pt. Continues to have difficulty reaching further to the right side of his head with the left. 11/16/2022: Pt. Is able to use a hair pick for the left side of his head using his left hand. Pt. Presents with difficulty reaching to the right side of his head.09/26/2022: Pt. Is able to use a hair pick for the left side of his head using his left hand. Pt. Presents with difficulty reaching to the right side of his head. 5/13/202: 75% using a hair pick, assist to the right side of the head. 07/20/22: reaches 75% of head, assist for far R side of head, fatigues quickly. 05/25/2022: Pt. Is able to reach up with the left hand to the left side, top, and back of his head. 03/23/2022: Pt. is now able to more consistently initiate reaching up to his head with his left hand in preparation for haircare12/18/2023: Pt. is now able to more consistently initiate reaching up to his head with his left hand in preparation for haircare. Eval: Pt. is able to initiate reaching up for hair care with a long handled brush, however is unable to sustain UE's in elevation to perform the task.     Goal status: Achieved  7. Pt. Will independently navigate the w/c through his environment with minA with visual scanning, and hand placement on the controls.  Baseline: 11/16/2022: Deferred 2/2 vision 09/26/2022: Pt. Is now navigating his w/c ouside the home, and around his block.  08/15/2022: Pt. Requires minA to setup hand on controls and MIN cues to navigate the w/c in wide spaces, requires MIN - MOD cues to navigate the w/c through more narrow doorways, and tighter turns - varies based on good vs bad vision days.07/20/22: Pt. Requires minA to setup hand on controls and MIN cues to navigate the w/c in wide spaces, requires MIN - MOD cues to navigate the w/c through more narrow doorways, and tighter turns - varies based on good vs bad vision days. 05/25/2022: Pt. Requires minA  and  cues to navigate the w/c in wide spaces, and  requires Mod cues to navigate the w/c through more narrow doorways, and tighter turns. Pt. Requires max cues for scanning through the environment, and moderate cues for hand placement on the controls.     Goal status: Deferred 2/2 vision    8. Pt. Will improve bilateral grip strength to be able to independently grasp, and pull up blankets, and linens.   Baseline: 05/10/23: Deferred 03/08/23: R grip 0-5 lbs, L grip 8 lbs; pt reports he can consistently pull up blankets and linens independently.  02/13/2023: Continue. Pt. presents with digit PIP, and DIP flexor tightness. 01/09/2023: Continue 12/28/2023: R: 0#, L: 5# Pt. Is now able to more consistently grasp and pull blankets up over him. 11/16/2022: R: 0 L: 8# Pt. Is more consistently grasping his blankets and attempting to pull them up. 09/26/2022: R: 0  L: 8# Pt. is attempting to grasp, and pull blankets up more, using mostly the left hand. 08/15/2022: Pt. has difficulty securely holding, and pulling up blankets, and linens.    Goal status:Deferred  9. Pt. will consistently actively control the releasing of blankets, covers, and linens from his hands once they are in the desired position over him. Baseline: 12/28/2022: Pt. Is consistently actively able to release linens once they are in the desired position over him. 11/16/2022: Pt. continues to improve with actively releasing blankets form his hands. 09/26/2022: Pt. is  improving with actively releasing blankets form his hands. 08/15/2022: Pt. has difficulty with controlled releasing of blankets/linens from his grasp.     Goal status: Achieved    10. Pt. Will improve bilateral UE strength by 2 MM grades to assist with ADLs, and IADLs  Baseline: 12/27/23:  Right: shoulder flexion: 4/5, abduction: 4/5, elbow flexion: 5/5 , extension: 3-/5 wrist extension: 2-/5; Left: shoulder flexion: 4+/5, abduction: 4+/5, elbow flexion: 5/5 , extension: 4+/5  wrist extension: 3+/5  08/27/23:  Right: shoulder flexion: 3+/5, abduction: 4-/5, elbow flexion: 4+/5 , extension: 4-/5 wrist extension: 2/5; Left: shoulder flexion: 4+/5, abduction: 4+/5, elbow flexion: 5/5 , extension: 4+/5  wrist extension: 3-/5  10/23/2023: Right: shoulder flexion: 4-/5, abduction: 4-/5, elbow flexion: 4+/5 , extension: 4-/5 wrist extension: 2/5; Left: shoulder flexion: 4+/5, abduction: 4+/5, elbow flexion: 5/5 , extension: 4+/5  wrist extension: 3+/5  08/27/23:  Right: shoulder flexion: 3+/5, abduction: 4-/5, elbow flexion: 4+/5 , extension: 4-/5 wrist extension: 2/5; Left: shoulder flexion: 4+/5, abduction: 4+/5, elbow flexion: 5/5 , extension: 4/5  wrist extension: 3/5  08/07/23: Continue 05/31/2023: Continue 05/10/23: Right: shoulder flexion: 3+/5, abduction: 4-/5, elbow flexion: 4+/5 , extension: 4-/5 wrist extension: 2/5; Left: shoulder flexion: 4+/5, abduction: 4+/5, elbow flexion: 5/5 , extension: 4/5  wrist extension: 3/5 03/13/2023: Right: shoulder flexion: 3/5, abduction: 4/5, elbow flexion: 4+/5 , extension: 4-/5 wrist extension: 2/5; Left: shoulder flexion: 4+/5, abduction: 4+/5, elbow flexion: 5/5 , extension: 4/5  wrist extension: 3/5 03/08/23: NT this date d/t as pt arrived late; 02/13/2023: Continue 01/09/2023: Continue 12/28/2022: Right: shoulder flexion: 3/5, abduction: 4/5, elbow flexion: 4+/5 , extension: 4-/5 wrist extension: 2/5; Left: shoulder flexion: 4+/5, abduction: 4+/5, elbow flexion: 5/5 ,  extension: 4/5  wrist extension: 3/5 Right: shoulder flexion: 3+/5, abduction: 4/5, elbow flexion: 4+/5 , extension: 4+/5 wrist extension: 2/5; Left: shoulder flexion: 4+/5, abduction: 4+/5, elbow flexion: 5/5 , extension: 4+/5 wrist extension: 3-/5 11/16/2022: Right: shoulder flexion: 3/5, abduction: 4/5, elbow flexion: 4+/5 , extension: 4+/5 wrist extension: 2/5; Left: shoulder flexion: 4+/5, abduction: 4+/5, elbow flexion: 5/5 , extension:  wrist extension: 3-/5 Right: shoulder flexion:  3+/5, abduction: 4/5, elbow flexion: 4+/5 , extension: 4+/5 wrist extension: 2/5; Left: shoulder flexion: 4+/5, abduction: 4+/5, elbow flexion: 5/5 , extension: 4+/5 wrist extension: 3-/5 Goal status: Ongoing  11. Pt. will improve right wrist extension by 10 degrees to initiate reaching for items in preparation for ADLs. Baseline: 12/27/23: Right wrist extension: -38(-6) 10/23/23: -30(-8) 08/23/2023: R Wrist Ext: -31(-8)  08/07/23: Continue 05/31/23: Continue 05/10/23: Right wrist extension: -32(22) 03/13/23: -22(35) 03/08/23: NT this date as pt arrived late; 02/13/2023: Continue 01/09/2023: Continue 12/28/2022: -22(34) 11/16/2022: -30(10)    Goal status: Ongoing      12. Pt. Will require donn/doff a Tank top T-shirt efficiently with supervision and full set-up.    Baseline: 12/27/23: Independent doffing, MaxA doffing 10/23/23: Independent doff the shirt/tank, MaxA  donning. 10/23/23: Pt. Is able to independently doff his shirt, requiring assist to lean forward and loosen the back. 08/07/23: Continue 05/31/2023: Pt. Requires maxA donning a pullover sweatshirt, Mod-maxA doffing it. T-shirt tank TBD 05/10/23: Pt. Presents with difficulty completing, and requires increased time to complete    Goal status: Ongoing    13. Pt. Will independently perform bilateral/bimanual hand tasks needed to fold towels/linens/laundry Baseline: 12/27/23: Pt. Has not been able to work on the this  2/2 multiple cancellations this recertification 2/2  illness, familiy health issues,a nd transportation. 10/23/23: Pt. Continues to have difficulty folding laundry, and linens. 08/23/23: Pt. Continues to have difficulty using bilateral/bimanual hand tasks needed to fold towels/linens/laundry. 08/07/23: Continue 05/31/23: Continue 05/10/23:  Pt. Has difficulty folding towels/lines/laundry using bilateral hands Goal status:Ongoing  14. Pt. Will independently, and efficiently reach his right arm over the side of the chair to pet his dog.    Baseline: 08/23/2023: Pt. Is now able to reach over the side of the armrest of his recliner chair to pet his dog.  08/07/23: Continue 05/31/2023: Continue 05/10/23: Pt. is unable to efficiently reach his right UE over the side of his recliner chair to pet his dog.    Goal status: Achieved    15. Pt. Will independently, and efficiently reach out in front of him to efficiently place items onto his table while sitting in his recliner.     Baseline: 12/27/2023: Pt. is able to reach out to efficiently reach out in front of him to place items on the table 90% of the time with the LUE, 10% of the time with the RUE 2/2  increased flexor tone. 10/23/23: Pt. Is improving with functional reaching with the left UE out in front of him to place items onto the table. Pt. has difficulty reaching out with the right hand to  place items on the table. 08/23/2023: Pt. has difficulty consistently reaching out in front of him to place items on the on a tabletop surface with the right hand. 08/07/23: Continue 2/26/225: Continue 05/10/23: Pt. has difficulty reaching out in front of him far enough to efficiently place items on the table while sitting in a recliner chair.    Goal status: Continue/Ongoing     16. Pt. Will improve bilateral hand supination by 10 degrees to be able actively turn items in his hand to look at them independently and efficiently during ADLs/IADLs,    Baseline: 12/27/23: Right: Unable 2/2 increased tightness  Left: supination 20(38)  10/23/23: Right: unable, L: supination 30(38)    Goal status: Continue/Ongoing      17. Pt. Will independently, and efficiently transition between picking up finger foods for dipping, and  picking up utensils , then placing  them down on the tabletop surface through 75% of the meal with minimal cues.    Baseline: 12/27/23: Pt. is able to consistently transition between picking up finger foods for dipping, and utensils consistently with the left hand with 100% accuracy time, and the right hand with 5% accuracy. 10/23/23: Pt. requires heavy cues, and assist to grasp finger foods, and utensils, and transition between the two.    Goal status: Continue/Ongoing   ASSESSMENT:  CLINICAL IMPRESSION:  Measurements were obtained and goals were reviewed with the patient and caregiver for recertification. Pt. has not had a chance to progress with the goals over this past recertification period due to having had a prolonged absence from therapy 2/2 illness, transportation, and family health concerns. Pt. presents with increased tightness throughout the right UE with increased tightness in the elbow flexors, forearm pronators, wrist, and digit PIP/DIP flexors. Pt. presents with tightness in the left forearm pronators, wrist, and digit PIP/DIP flexors. The tightness is limiting his ability to efficiently reach out to place items on a table, transition between grasping finger foods and utensils when eating, releasing objects from the hand, grasping small objects, and moving them between his hands. Pt. Is way overdue for Botox injections and is not scheduled for Botox until early November with is contributing to the tightness. Pt. continues to be very motivated to work towards regaining function in the bilateral hands. Pt.'s caregivers continue to be very supportive to continue working towards improving BUE functioning, ROM, strength, motor control, [facility] skills in order to maximize independence with daily ADLs, and IADL  functioning. Plan to continue with the same goals into the next recertification period. To avoid duplication, will plan to keep the same status update, measurements, and established goals for his 10th visit update (which is due next visit ) as they have been completed, and reviewed with the Pt./caregiver this visit for the recertification.  PERFORMANCE DEFICITS in functional skills including ADLs, IADLs, coordination, dexterity, proprioception, ROM, strength, pain, FMC, GMC, decreased knowledge of use of DME, and UE functional use, cognitive skills including safety awareness, and psychosocial skills including coping strategies, environmental adaptation, habits, and routines and behaviors.   IMPAIRMENTS are limiting patient from ADLs, IADLs, leisure, and social participation.   COMORBIDITIES may have co-morbidities  that affects occupational performance. Patient will benefit from skilled OT to address above impairments and improve overall function.  MODIFICATION OR ASSISTANCE TO COMPLETE EVALUATION: Maximum or significant modification of tasks or assist is necessary to complete an evaluation.  OT OCCUPATIONAL PROFILE AND HISTORY: Comprehensive assessment: Review of records and extensive additional review of physical, cognitive, psychosocial history related to current functional performance.  CLINICAL DECISION MAKING: High - multiple treatment options, significant modification of task necessary  REHAB POTENTIAL: Fair    EVALUATION COMPLEXITY: High    PLAN: OT FREQUENCY: 2 x's a week  OT DURATION:12 weeks  PLANNED INTERVENTIONS: self care/ADL training, therapeutic exercise, therapeutic activity, neuromuscular re-education, manual therapy, passive range of motion, patient/family education, and cognitive remediation/compensation RECOMMENDED OTHER SERVICES: PT  CONSULTED AND AGREED WITH PLAN OF CARE: Patient and family member/caregiver  PLAN FOR NEXT SESSION: Review HEP   Richardson Otter, MS, OTR/L  12/27/2023

## 2023-12-27 NOTE — Therapy (Signed)
 OUTPATIENT PHYSICAL THERAPY NEURO TREATMENT   Patient Name: Paul Hall MRN: [id number] DOB:1996-03-22, 28 y.o., male Today's Date: 12/28/2023   PCP:  Morene Sous, PA-C  REFERRING PROVIDER: Morene Sous, PA-C  END OF SESSION:  PT End of Session - 12/27/23 1618     Visit Number [id number]    Number of Visits 200   date corrected from most recent recert   Date for Recertification  03/18/24    Authorization Type [employer] COMM [employer]/ [place] Medicaid    Authorization Time Period 01/04/21-03/29/21; Recert 03/24/2021-06/16/2021; Recert 09/15/2021- 12/08/2021; Recert 12/13/2021-03/07/2022    Progress Note Due on Visit 180    PT Start Time 1618    PT Stop Time 1705    PT Time Calculation (min) 47 min    Equipment Utilized During Treatment Gait belt    Activity Tolerance Patient tolerated treatment well    Behavior During Therapy WFL for tasks assessed/performed           Past Medical History:  Diagnosis Date   Diabetes mellitus (HCC)    Hypertension    Stroke [facility])    Past Surgical History:  Procedure Laterality Date   IVC FILTER PLACEMENT ([facility] HX)     LEG SURGERY     PEG TUBE PLACEMENT     TRACHEOSTOMY     Patient Active Problem List   Diagnosis Date Noted   Sepsis due to vancomycin  resistant Enterococcus species (HCC) 06/06/2019   SIRS (systemic inflammatory response syndrome) (HCC) 06/05/2019   Acute lower UTI 06/05/2019   VRE (vancomycin -resistant Enterococci) infection 06/05/2019   Anemia 06/05/2019   Skin ulcer of sacrum with necrosis of muscle (HCC)    Urinary retention    Type 2 diabetes mellitus without complication, with long-term current use of insulin  (HCC)    Tachycardia    Lower extremity edema    Acute metabolic encephalopathy    Obstructive sleep apnea    Morbid obesity with BMI of 60.0-69.9, adult (HCC)    Goals of care, counseling/discussion    Palliative care encounter    Sepsis (HCC) 04/27/2019   H/O insulin  dependent diabetes mellitus 04/27/2019    History of CVA with residual deficit 04/27/2019   Seizure disorder (HCC) 04/27/2019   Decubitus ulcer of sacral region, stage 4 (HCC) 04/27/2019    ONSET DATE: October 2020  REFERRING DIAG: Cerebral infarction, unspecified   THERAPY DIAG:  Muscle weakness (generalized)  Other lack of coordination  History of CVA with residual deficit  Difficulty in walking, not elsewhere classified  Unsteadiness on feet  Other abnormalities of gait and mobility  Abnormality of gait and mobility  Low vision, both eyes  Cognitive communication deficit  Rationale for Evaluation and Treatment: Rehabilitation  SUBJECTIVE:  SUBJECTIVE STATEMENT: Patient reports doing okay- nothing new to report. Ready to work today.   Pt accompanied by: family member  PERTINENT HISTORY: Paul Hall is a 28 y/o male who presents with severe weakness, quadriparesis, altered sensorium, and visual impairment s/p critical illness and prolonged hospitalization. Pt hospitalized in October 2020 with ARDS 2/2 COVID19 infection. Pt sustained a complex and lengthy hospitalization which included tracheostomy, prolonged sedation, ECMO. In this period pt sustained CVA and SDH. Pt has now been liberated from tracheostomy and G-tube. Pt has since been hospitalized for wound infection and UTI. Pt lives with parents at home, has hospital bed and left chair, hoyer lift transfers, and power WC for mobility needs. Pt needs heavy physical assistance with ADL 2/2 BUE contractures and motor dysfunction   PAIN:  Are you having pain? No  PRECAUTIONS: Fall  RED FLAGS: None   WEIGHT BEARING RESTRICTIONS: No  FALLS: Has patient fallen in last 6 months? No  LIVING ENVIRONMENT: Lives with: lives with their family Lives in:  House/apartment Stairs:  Has following equipment at home: Wheelchair (power) and hospital bed, hoyer lift, Sit to stand lift  PLOF: Independent  PATIENT GOALS: I want to walk  OBJECTIVE:  Note: Objective measures were completed at Evaluation unless otherwise noted.  DIAGNOSTIC FINDINGS: CLINICAL DATA:  Altered mental status.  History of COVID and stroke.   EXAM: CT HEAD WITHOUT CONTRAST   TECHNIQUE: Contiguous axial images were obtained from the base of the skull through the vertex without intravenous contrast.   COMPARISON:  CT head 11/11/2012   FINDINGS: Brain: Left cerebral convexity cortical hypodensity with associated cortical calcification extending from the frontal to the parietal lobe. Extensive area of volume loss and low-density compatible with chronic infarction. This is noted on prior reports from [facility]. Probable chronic watershed infarct. Additional small areas of hypodensity in the right frontal white matter consistent with chronic ischemia.   Negative for acute infarct. No acute hemorrhage or mass. Ventricle size normal without midline shift.   Vascular: Normal arterial flow voids.   Skull: Negative   Sinuses/Orbits: Mucosal edema paranasal sinuses with air-fluid level left sphenoid sinus. Negative orbit   Other: None   IMPRESSION: Extensive watershed type infarct over the left cerebral convexity with cortical calcification. This appears chronic. No associated hemorrhage, hydrocephalus, or midline shift.   These results were called by telephone at the time of interpretation on 05/09/2019 at 3:12 pm to provider [name] , who verbally acknowledged these results.     Electronically Signed   By: [name] M.D.   On: 05/09/2019 15:13  COGNITION: Overall cognitive status: Impaired   SENSATION: Light touch: WFL  COORDINATION: Delayed with BLE  EDEMA:  None observed  MUSCLE TONE: RLE: Moderate  POSTURE: rounded shoulders and  forward head  LOWER EXTREMITY ROM:     Active  Right Eval Left Eval  Hip flexion    Hip extension    Hip abduction    Hip adduction    Hip internal rotation    Hip external rotation    Knee flexion    Knee extension    Ankle dorsiflexion    Ankle plantarflexion    Ankle inversion    Ankle eversion     (Blank rows = not tested)  LOWER EXTREMITY MMT:    MMT Right Eval Left Eval  Hip flexion    Hip extension    Hip abduction    Hip adduction    Hip internal rotation  Hip external rotation    Knee flexion    Knee extension    Ankle dorsiflexion    Ankle plantarflexion    Ankle inversion    Ankle eversion    (Blank rows = not tested)  BED MOBILITY:  Findings: Rolling to Right Max A Rolling to Left Max A  TRANSFERS: Sit to stand: Max A x 2 - just able to clear gluteals off mat  Assistive device utilized: HHA x2 to stand- dependent on hoyer for actual transfers     Stand to sit: Max A  Assistive device utilized: HHA x 2        GAIT: Findings: Non-Ambulatory  FUNCTIONAL TESTS:  None appropriate at this time  PATIENT SURVEYS:                                                                                                                                TREATMENT DATE: 12/27/2023   TA: Patient was dependently positioned from power w/c to supine on mat for further testing of goals today.   Initially he was able to move his legs into hooklying position with min physical assist. Asked to perform 10 bridges with PT sliding hand under on each rep to ensure his glutes cleared the mat- He was able to complete 10 reps with minimal bed clearance yet able to tighten gluteals and maintain foot position (Shoes on today which likely gave him greater traction). Will adjust goal and add 3 sets of 10 reps for improved strength to continue to assist his mom with bed mobility for linen/clothes changing, etc...  He then performed rolling from supine to left - initially separated  activity out with just RLE hip IR/add 2 x10 reps to focus on rolling RLE toward left. Then he performed some dynamic RUE cross jab or horizontal shoulder ADD (more difficult due to tone RUE). He required min assist and more cues to perform with minimal R shoulder clearing mat. Next able to add both parts into rolling entire right side to left - able to perform 2 sets of 10 reps to left and then later 2 sets of 10 reps to right as well. Patient was fatigued yet able to complete with mod/max assist.        PATIENT EDUCATION: Education details: Exercise technique; Plan for recert today Person educated: Patient and Parent Education method: Explanation Education comprehension: verbalized understanding  HOME EXERCISE PROGRAM: Access Code: [email or web address] URL: [email or web address] Date: 03/23/2022 Prepared by: Reyes London   Exercises - Supine Bridge  - 3 x weekly - 3 sets - 10 reps - 2 hold - Supine Gluteal Sets  - 3 x weekly - 3 sets - 10 reps - 5 sec hold - Supine Quad Set  - 1 x daily - 3 x weekly - 3 sets - 10 reps - 5 hold - Seated Long Arc Quad  - 1 x daily - 7 x weekly - 3 sets - 10  reps - Seated Hip Adduction Squeeze with Ball  - 1 x daily - 3 x weekly - 3 sets - 10 reps - 5 hold - Seated Hip Abduction  - 1 x daily - 3 x weekly - 3 sets - 10 reps - 2 hold  GOALS: Goals reviewed with patient? Yes  SHORT TERM GOALS: Target date: 02/05/2024  Patient will be independent with updated HEP for improved overall LE strength in home Baseline: HEP continues to be updated as appropriate.  Goal status: NEW  LONG TERM GOALS: Target date: 03/18/2024 -Goal were updated/revised secondary to length of chart.   Patient will perform sit to stand transfer with appropriate AD and max assist of 2 people with 75% consistency to prepare for pregait activities.  Baseline: 09/04/2023- Patient performed several Sit to stand requiring max A+2  with improving posture- able to ext R knee better  - near terminal knee and able to clear bottom off mat- still not full but able to contract glutes for standing as erect as possible.  Goal status: ONGOING  2.  Patient will tolerate 2 minutes or more of standing in sit to stand lift or with max assist +2 in order to indicate improved lower extremity weightbearing tolerance for progression to standing in parallel bars  Baseline:  09/04/2023- Patient performed 3 trials of standing ranging from 17 -47 sec - improving right knee terminal knee ext and more activation upon command with gluteals- able to clear mat table. Continued to require Max A+2 and blocking knees with forearm/trunk support.   Goal status: ONGOING  3.  Patient will perform rolling left to right with Mod assist for improved independence with bed mobility to assist with dressing and positioning in bed.  Baseline: Dependent on caregiver to roll to left; 12/27/2023= Mod to max assist each direction (difficult due to RUE tone)  Goal status: Ongoing  4.  Patient will perform active bridging -able to clear bottom   Off mat for 10 reps with PT/caregiver holding his feet for improved trunk ext strength with standing and to assist with mobility/positioning.  Baseline: Unable to perform 10 reps with consistent clearing of bottom; 12/27/2023 = Patient able to minimally clear his glues off mat table - but successfully 10 reps today.  Goal status: MET  5. Patient will perform active bridging -able to clear bottom   Off mat for 3 sets of 10 reps with PT/caregiver holding his feet for improved trunk ext strength with standing and to assist with mobility/positioning.  Baseline: 12/27/2023- Patient able to complete 10 reps with max effort  Goal status: New     ASSESSMENT:  CLINICAL IMPRESSION: Patient presents with great motivation today and able to jump in and demonstrate some real progress. He was able to keep feet in hooklye position with his shoes donned and only CGA of PT and then able to meet  his goal of 10 reps with gluteal bridging- able to achieve solid contraction and lift glutes minimally off table. He will need this strength to improve to assist him in standing posture and added new goal for him to continue to work on this aspect of strengthening in home. He later demonstrated improved  ability to transfer (rolling) and was abel to perform with mod/max assist vs. Dependent for a long time. His progress has been slow but still progress and Patient's condition has the potential to improve in response to therapy. Maximum improvement is yet to be obtained. The anticipated improvement is attainable and reasonable  in a generally predictable time.  Pt will continue to benefit from skilled physical therapy intervention to address impairments, improve QOL, and attain therapy goals.   OBJECTIVE IMPAIRMENTS: Abnormal gait, cardiopulmonary status limiting activity, decreased activity tolerance, decreased balance, decreased cognition, decreased coordination, decreased endurance, decreased knowledge of condition, decreased knowledge of use of DME, decreased mobility, difficulty walking, decreased ROM, decreased strength, hypomobility, impaired flexibility, impaired sensation, impaired UE functional use, impaired vision/preception, postural dysfunction, obesity, and pain.   ACTIVITY LIMITATIONS: carrying, lifting, bending, sitting, standing, squatting, stairs, transfers, bed mobility, bathing, toileting, dressing, self feeding, reach over head, and hygiene/grooming  PARTICIPATION LIMITATIONS: meal prep, cleaning, laundry, medication management, personal finances, interpersonal relationship, driving, shopping, community activity, occupation, yard work, and church  PERSONAL FACTORS: Time since onset of injury/illness/exacerbation and 3+ comorbidities: DM, CVA, Seizures are also affecting patient's functional outcome.   REHAB POTENTIAL: Good  CLINICAL DECISION MAKING: Evolving/moderate  complexity  EVALUATION COMPLEXITY: Moderate  PLAN:  PT FREQUENCY: 1-2x/week  PT DURATION: 12 weeks  PLANNED INTERVENTIONS: 97110-Therapeutic exercises, 97530- Therapeutic activity, 97112- Neuromuscular re-education, 97535- Self Care, [id number]- Manual therapy, and Patient/Family education  PLAN FOR NEXT SESSION:   Resume standing as able  Attempt standing frame if appropriate Nustep for UE/LE strengthening Mat table dynamic sitting activities.    Reyes LOISE London, PT 12/28/2023, 8:07 AM

## 2024-01-01 ENCOUNTER — Ambulatory Visit

## 2024-01-01 ENCOUNTER — Ambulatory Visit: Admitting: Occupational Therapy

## 2024-01-03 ENCOUNTER — Ambulatory Visit

## 2024-01-03 ENCOUNTER — Ambulatory Visit: Admitting: Occupational Therapy

## 2024-01-08 ENCOUNTER — Ambulatory Visit: Admitting: Occupational Therapy

## 2024-01-08 ENCOUNTER — Ambulatory Visit: Attending: Physician Assistant

## 2024-01-08 DIAGNOSIS — R2681 Unsteadiness on feet: Secondary | ICD-10-CM | POA: Diagnosis not present

## 2024-01-08 DIAGNOSIS — R2689 Other abnormalities of gait and mobility: Secondary | ICD-10-CM | POA: Diagnosis not present

## 2024-01-08 DIAGNOSIS — I6930 Unspecified sequelae of cerebral infarction: Secondary | ICD-10-CM | POA: Diagnosis not present

## 2024-01-08 DIAGNOSIS — R269 Unspecified abnormalities of gait and mobility: Secondary | ICD-10-CM | POA: Insufficient documentation

## 2024-01-08 DIAGNOSIS — R262 Difficulty in walking, not elsewhere classified: Secondary | ICD-10-CM | POA: Insufficient documentation

## 2024-01-08 DIAGNOSIS — M6281 Muscle weakness (generalized): Secondary | ICD-10-CM | POA: Insufficient documentation

## 2024-01-08 DIAGNOSIS — R278 Other lack of coordination: Secondary | ICD-10-CM | POA: Insufficient documentation

## 2024-01-08 DIAGNOSIS — H543 Unqualified visual loss, both eyes: Secondary | ICD-10-CM | POA: Diagnosis not present

## 2024-01-08 DIAGNOSIS — R41841 Cognitive communication deficit: Secondary | ICD-10-CM | POA: Insufficient documentation

## 2024-01-08 NOTE — Therapy (Addendum)
 . Occupational Therapy Progress Note  Dates of reporting period  10/23/23   to   01/08/24     Patient Name: Paul Hall MRN: [id number] DOB:January 08, 1996, 28 y.o., male Today's Date: 01/08/2024  PCP: Dr. Bertell REFERRING PROVIDER: Dr. Bertell   .   OT End of Session - 01/08/24 1709     Visit Number [id number]    Date for Recertification  03/20/24    OT Start Time 1615    OT Stop Time 1700    OT Time Calculation (min) 45 min    Equipment Utilized During Treatment tilt in space power wc    Activity Tolerance Patient tolerated treatment well    Behavior During Therapy WFL for tasks assessed/performed                  Past Medical History:  Diagnosis Date   Diabetes mellitus (HCC)    Hypertension    Stroke [facility])    Past Surgical History:  Procedure Laterality Date   IVC FILTER PLACEMENT ([facility] HX)     LEG SURGERY     PEG TUBE PLACEMENT     TRACHEOSTOMY     Patient Active Problem List   Diagnosis Date Noted   Sepsis due to vancomycin  resistant Enterococcus species (HCC) 06/06/2019   SIRS (systemic inflammatory response syndrome) (HCC) 06/05/2019   Acute lower UTI 06/05/2019   VRE (vancomycin -resistant Enterococci) infection 06/05/2019   Anemia 06/05/2019   Skin ulcer of sacrum with necrosis of muscle (HCC)    Urinary retention    Type 2 diabetes mellitus without complication, with long-term current use of insulin  (HCC)    Tachycardia    Lower extremity edema    Acute metabolic encephalopathy    Obstructive sleep apnea    Morbid obesity with BMI of 60.0-69.9, adult (HCC)    Goals of care, counseling/discussion    Palliative care encounter    Sepsis (HCC) 04/27/2019   H/O insulin  dependent diabetes mellitus 04/27/2019   History of CVA with residual deficit 04/27/2019   Seizure disorder (HCC) 04/27/2019   Decubitus ulcer of sacral region, stage 4 (HCC) 04/27/2019   ONSET DATE: 01/2019  REFERRING DIAG: CVA/COVID-19  THERAPY DIAG:  Muscle weakness  (generalized)  Rationale for Evaluation and Treatment Rehabilitation  SUBJECTIVE:    SUBJECTIVE STATEMENT:  Pt. Reports that they returned the [employer] today, as his insurance declined approval for it.   Pt accompanied by: self and family member  PERTINENT HISTORY:  Pt. is a 28 y.o. male who was diagnosed with COVID-19, and CVA with resultant quadriplegia. Pt. Was then hospitalized with VRE UTI. PMHx includes: urinary retention, seizure disorder, obstructive sleep disorder, DM Type II, Morbid obesity.   PRECAUTIONS: None  WEIGHT BEARING RESTRICTIONS No  PAIN:  No reports of pain Are you having pain? 0/10  FALLS: Has patient fallen in last 6 months? No  LIVING ENVIRONMENT: Lives with: lives with their family Lives in: House/apartment Stairs: No Level Entry Has following equipment at home: Wheelchair (power) and hoyer lift, sit to stand lift  PLOF: Independent  PATIENT GOALS: To be able to engage in more daily care tasks.  OBJECTIVE:   HAND DOMINANCE: Right  ADLs:  Transfers/ambulation related to ADLs: Eating: Pt. reports being able to hold standard utensils, and is starting to engage more in self-feeding tasks, hand to mouth patterns. Pt. Reports that he does as much as he can with the task, and family assists  with the remainder of the task. Grooming: Pt.  is able to initiate holding an electric toothbrush, and brush his teeth. Family assists LB Dressing: Total Assist UE dressing: Pt. is now able to reach up to actively assist with grasping , and pulling his gown down. Toileting:  Total Assist Bathing: MaxA UB, Total assist LB Tub Shower transfers: N/A Equipment: See above    IADLs: Shopping: Relies on family to assist Light housekeeping: Total Assist Meal Prep: Total Assist Community mobility:   Medication management:  Total Assist  Financial management: N/A Handwriting: Not legible: Pt. Is able to hold a pen with the left hand, and initiate marking the  page. Pt.'s eye glasses were not available  MOBILITY STATUS:  Power w/c  POSTURE COMMENTS:  Pt. Requires position changes in his power w/c  ACTIVITY TOLERANCE: Activity tolerance:  Fair  FUNCTIONAL OUTCOME MEASURES: FOTO: 40 TR score: 45  05/25/2022:   FOTO: 50 TR score: 45  07/20/22: FOTO 55  03/08/23: 51  UPPER EXTREMITY ROM     Active ROM Left eval Left  03/21/2022 Left 05/25/2022 L  07/20/22 Left 07/25/2022 Left  08/15/2022 Left  09/26/2022 Left  11/16/2022 Left 12/28/2022 Left 03/13/23 Left 05/10/23 Left 10/23/23 Left 12/27/23  Shoulder flexion 119 123 125 130  136 138 130 142 144 144 148 148  Shoulder abduction 110 115 115 115  118 121 125 142 140 140 140 140  Shoulder adduction               Shoulder extension               Shoulder internal rotation               Shoulder external rotation               Elbow flexion 135(135) 145 145 145  145    145 145  145  Elbow extension -27(-18) -21(-20) -20(-16)  -25(-10) -23(-10) -21(-10) -20(-18) -20(-18) -20(-17) -17(-16) -12(-8) -12(-6)  Wrist flexion               Wrist extension 10(50) 19(50) 28(50)  30(50) 35(55) 36(55) 36(60) 40(60) 45(60) 45(60) 42(54) 36(54)  Wrist ulnar deviation               Wrist radial deviation               Wrist pronation            60(74) 84(84)  Wrist supination   Limited by flexor tone       10(15)  30(38) 20(38)  (Blank rows = not tested)    Active ROM Right eval Right 03/21/2022 Right 05/25/2022 R  07/20/22 Right  07/25/22 Right 08/15/2022 Right 09/26/2022 Right 11/16/2022 Right 12/28/2022 Right 03/13/23 Right 05/10/23 Right 10/23/23 Right  9/24 /25  Shoulder flexion 106 scaption 105  Scaption 62 flexion 110 scaption 100  105 105 105 112 Scaption 123 Scaption 122  Scaption 142 Scaption 142  Shoulder abduction 114 90 97 100  105 117 120 124 124 122 144 146  Shoulder adduction               Shoulder extension               Shoulder internal rotation               Shoulder  external rotation               Elbow flexion 120(130) 145 145 140  144 140 142 148 148 145  145  Elbow extension -45(-35) -22(-35) -28(-22)  -32(-21) -32(-28) -28(-26) -28(-28) -27(-26) -27(-40) -38(-35) -34(-26) -34(-26)  Wrist flexion               Wrist extension -30(10) -25(10) -10(30)  -10(20) -10(25) -20(40) -30(10) -22(34) -22(35) -32(22) -30(-8) -38(-6)  Wrist ulnar deviation               Wrist radial deviation               Wrist pronation               Wrist supination   Limited by flexor tone                 12/28/2022: Thumb radial abduction: Right: 12(46), Left: 40(54)  Digits: Bilateral 2nd through 5th digit: MP Hyperextension with PIP, an DIP flexion    10/23/23:  Bilateral 2nd through 5th digit MP hyperextension with PIP, and DIP flexion: Pt. has been intermittently able to initate active diigt PIP, and DIP extension following Botox during this past recertification/progress reporting period.     UPPER EXTREMITY MMT:     Left eval Left 03/21/2022 Left  05/25/2022 L 07/20/22 Left  08/15/2022 Left 09/26/2022 Left 11/16/2022 Left 12/28/2022 Left  03/13/2023 Left  05/10/23 Left 10/23/23 Left 12/27/23  Shoulder flexion 3-/5 3/5 3+/5 4+/5 4+/5 4+/5 4+/5 4+/5 4+/5 4+/5 4+/5 4+/5  Shoulder abduction 3-/5 3-/5 4-/5 4+/5 4+/5 4+5 4+/5 4+/5 4+/5 4+/5 4+/5 4+/5  Shoulder adduction              Shoulder extension              Shoulder internal rotation              Shoulder external rotation              Elbow flexion 3/5 3+/5 4-/5 4+/5 5/5 5/5 5/5 5/5 5/5 5/5 5/5 5/5  Elbow extension 2-/5 2/5  4+/5 4+/5 4+/5  4/5 4/5 4/5 through available range 4+/5 through available range 4+/5  Wrist flexion              Wrist extension 2/5 2+/5 3-/5  3-/5 3-/5 3-/5 3/5 3/5 3/5 3+/5 3-/5  Wrist ulnar deviation              Wrist radial deviation              Wrist pronation              Wrist supination              (Blank rows = not tested)  Right eval Right 03/21/2022 Right  05/25/2022  R 07/20/22 Right 08/15/2022 Right 09/26/2022 Right 11/16/2022 Right 12/28/2022 Right 03/13/2023 Right 05/10/23 Right 10/23/23 Right 12/27/23  Shoulder flexion 3-/5 scaption 3-/5 3-/5 3+/5 3+/5 3+/5 3/5 3/5 3/5 3+/5 4-/5 4/5  Shoulder abduction 3-/5 3-/5 3-/5 4/5 4/5 4/5 4/5 4/5 4/5 4/-5 4-/5 4/5  Shoulder adduction              Shoulder extension              Shoulder internal rotation              Shoulder external rotation              Elbow flexion 3/5 3/5 3+/5 4/5 4+/5 4+/5 4+/5 4+/5 4+/5 4+/5 4+/5 5/5  Elbow extension 2-/5 2/5 3-/5 4+/5 4+/5 4+/5 4+/5 4-/5 4-/5 4-/5 4-/5 through available  range 3-/5  Wrist flexion              Wrist extension 2-/5 2-/5 2/5 2/5 2/5 2/5 2/5 2/5 2/5 2/5 2/5 2-/5  Wrist ulnar deviation              Wrist radial deviation              Wrist pronation              Wrist supination                HAND FUNCTION:  Grip strength: Right: 0 lbs; Left: 0 lbs and Lateral pinch: Right: 5 lbs, Left: 2 lbs  03/21/2022: Lateral pinch: Right: 3.5 lbs, Left: 2 lbs  07/20/22: Grip strength: Right: 3 lbs; Left: 4 lbs and Lateral pinch: Right: 5 lbs, Left: 3 lbs  08/15/2022:  Grip strength: Right: 0 lbs; Left: 5 lbs and Lateral pinch: Right: 2 lbs, Left: 4 lbs  09/26/2022  Grip strength: Right: 0 lbs; Left: 8 lbs and Lateral pinch: Right: 2 lbs, Left: 5 lbs  11/16/2022  Grip strength: Right: 0 lbs; Left: 8 lbs  9/25/20204  Grip strength: Right: 0 lbs; Left: 8 lbs  Grip strength: Right: 0 lbs; Left: 8 lbs   03/08/23:  Grip strength: Right: 0 lbs without PROM, 5 lbs after PROM, Left 8 lbs  05/10/23:  Grip strength: TBD  Bilateral digit PIP/DIP flexion contractures with MP hyperextension with attempts for AROM. Pt. is able to tolerate AROM to the bilateral digits at the initial evaluation however, has a history of pain in the digits.  COORDINATION: Eval: Pt. is unable to grasp 9-hole test pegs. Pt. is able to initiate grasping larger pegs, and is able to  hold a pen in the left hand.  07/20/22: 2 min 36 seconds to remove 9 pegs from 9 hole peg test - cues to locate pegs 2/2 low vision. Pt. is able to initiate grasping larger pegs on R hand and is able to hold a pen in the left hand.  08/15/2022:    Vision, and sensation limiting accuracy of 9 hole peg test results. Pt. Was able to grasp and remove vertical pegs intermittently with cues.    05/10/23: TBD  SENSATION: Light touch: Impaired   EDEMA:  N/A  MUSCLE TONE: BUE flexor Spasticity  COGNITION Overall cognitive status: Continue to assess in functional context  VISION:   Subjective report: Pt. was not wearing glasses at the time of the initial eval.  Baseline vision: Vision is very limited. Wears glasses all the time Visual history: History of impaired vision following CVA. Pt. Has received treatment through the [facility] low vision rehabilitation program.   VISION ASSESSMENT: Impaired To be further assessed in functional context  PERCEPTION: Impaired   PRAXIS: Impaired: motor planning  OBSERVATIONS:  Pt reports being on Tramadol   TODAY'S TREATMENT 01/08/24   Therapeutic Ex.:    -BUE PROM was performed with slow prolonged gentle stretching of distal RUE for elbow extension, and forearm supination, bilateral hands including: wrist/digit flexion/extension including MCPs, PIPs, DIPs of all digits, and wrist flexion/extension, and forearm supination in conjunction with moist heat modality 2/2 flexor tone, and tightness       PATIENT EDUCATION: Education details: ROM, BUE tone  Person educated: Patient and Parent Education method: Explanation Education comprehension: verbalized understanding  HOME EXERCISE PROGRAM:    Incorporating  the right hand during self-feeding tasks.   GOALS: Goals reviewed with patient? Yes  SHORT TERM GOALS: Target date: 12/05/2023   To assess splint fit, and make appropriate adjustments to promote good skin integrity through the palmar  surface of the bilateral hands.  Baseline: 05/25/22: Goal currently met, however ongoing as needs to assess splint fit arise. 03/23/2022: Pt. is wearing splints a couple of hours at night bilateral resting hand splints. 03/21/2022: Pt. is wearing splints a couple of hours at night bilateral resting hand splints. Goal status: Deferred   LONG TERM GOALS: Target date: 03/20/2024   FOTO score will Improve by 2 points for Pt. perceived improvement with the assessment specific ADL/IADL tasks.  Baseline: 03/08/23: 51; 12/28/2022: FOTO score: 56; 11/16/2022: 52 09/27/2022: 51 07/20/2022: FOTO 55 05/25/2022: FOTO score: 50,  TR score: 45 Eval: FOTO score: 40, TR score: 45 Goal status: Achieved   2.   Pt. will independently perform oral care for 100% of the task after complete set-up. Baseline: 05/10/23: Pt. Performs 90% of the task. Pt.'s mother assists with 10% of the task for thorough cleaning the hard to reach places way in the back of the oral cavity.03/08/23: not as much assist required proximally, but to adjust positioning of toothbrush; 02/13/23: Pt. Continues to require assist proximally 01/09/2023: Continue 12/28/2022:  Pt. Continues to complete 90% of the task with complete set-up, with visual cues, and occassional assist of the right elbow. 11/16/2022: Pt. Completes 90% of the task with complete set-up. 09/26/2022: Completes 90% of the task, proximal assist at times required at the elbow.  08/15/2022: Completes 90% of the task, proximal assist at times required at the elbow. 07/20/22: completes 90% of task, limited by shoulder flexion. 05/25/2022:  Pt. Is able to initiate and perform oral care for approximately 90% of the task. Complete set-up required. Assi needed only for the very back teeth. 03/23/2022: Pt. Is able to initiate and perform oral care for approximately 75% of the task. Pt. Requires assist at proximally at the elbow for through oral care. 03/21/2022: Pt. Is able to initiate and perform oral care for  approximately 75% of the task. Pt. Requires assist at proximally at the elbow for through oral care. Eval: Pt. is able to initiate using an electric toothbrush. Pt. requires assist for set-up, and assist for thoroughness, and as he Pt. fatigues. Goal status:  Partially met, Goal discontinued  3.  Pt. Will be modified independence with self-feeding for 100% using a swivel spoon, and standard fork after complete set-up Baseline: 05/10/2023: Pt. feeds self 95-100% of the meal. with occasional assist to reposition the utensil. 03/08/23: indep with cup with a handle and lid, assist to reposition eating utensils in hand;  02/13/23:  Pt. Is now able to consistently, and efficiently use a swivel spoon for eating cereal with the left hand.01/09/2023: Continue 12/28/2022: Pt. Is now feeding himself 90% of the time using a swivel spoon with the left hand, and 95% of the time with the fork. Pt. Continues to have difficulty with cutting food. 11/16/2022: 100% with finger foods, Pt. Is initiating with a swivel spoon, and universal cuff for fork use, however requires assist for consistency, and accuracy.  09/26/2022: 90% using the spoon, assist at time with the forearm motion, and grip on the utensils. 100% for finger foods.  08/15/2022:  90% using the spoon, assit at time with the forearm motion, and grip on the utensils. 100% for finger foods-independent with set-up- unsupervised.  07/20/22: self-feeds cereal using spoon 90% of task. 05/25/2022: Pt. Is able to use a spoon to scoop  cereal when feeding himself cereal 85% of the time. Pt. Is able to feed himself snack/finger foods 100% of the time. Pt. Continues to work on consistency of  stabilizing a cup/mug when drinking. Pt. Is able to grasp a water  bottle with assist initially, with assist tapering off as he drinks.03/23/2022: Pt. Is able to perform scooping cereal for 75% of the time. Pt. required assist, and support at the left elbow, and Pt. Presents with limited forearm  supination when using the spoon, and bringing it towards his mouth. Pt. Is able to use a fork to spear items, and perform the hand to mouth pattern.  03/21/2022: Pt. Is able to perform scooping cereal for 75% of the time. Pt. required assist, and support at the left elbow, and Pt. presents with limited forearm supination when using the spoon, and bringing it towards his mouth. Pt. Is able to use a fork to spear items, and perform the hand to mouth pattern.  Eval: Pt. is able to hold standard standard utensils. Pt. Performs as much of the task as he, can and has assistance for the remainder. Goal status:  Improved, Achieved  4.  Pt. will improve grasp patterns and consistently grasp 1/4 objects for ADL, and IADL tasks.  Baseline: 01/08/24: Continue, carry goal over from recertification update last session. 12/27/23: Pt. is able to present with increased tightness  2/2 being way overdue for Botox injections, which limits consistency with grasping small objects. 10/23/2023: Pt. is able to initiate grasping small objects visual cues, and increased time, however continues to work towards grasping 1/4 objects efficiently. 08/27/23: Pt. is able to grasp flat discs, and sustain a secure hold on them while moving the UE through various planes. 08/07/23: Continue 05/31/2023: Continue 05/10/23: Pt. is consistently able to grasp 1/2 objects with visual cues. Pt. Requires assist, and increased visual cues to grasp 1/4 objects on an elevated plane.  Pt. Is unable to grasp the flat objects at the tabletop surface. Pt. Is grasping grasping small puppy vitamins.03/08/23: 1/2 objects efficiently; 02/13/2023: 1/2 objects efficiently 01/09/2023: Continue 12/28/2022: Continues to grasp 1/4 pegs with min a + visual cues, consistently grasping 1/2 objects with visual cues 11/16/2022: Continues to grasp 1/4 pegs with min a + visual cues, consistently grasping 1/2 objects with visual cues 09/26/2022: Continues to grasp 1/4 pegs with  min a + visual cues, consistently grasping 1/2 objects with visual cues. 08/15/2022: grasps 1/4 pegs with min a + visual cues, consistently grasping 1/2 objects with visual cues. 07/20/22: grasps 1/4 pegs with min a + visual cues, consistently grasping 1/2 objects with visual cues. 05/25/2022: Pt. Is working on improving consistency of grasping 1/2 objects with visual cues.  03/23/2022: Pt. Is able to grasp 1 objects consistently,and continues to work on the hand patterns needed to grasp 1/2 objects.03/21/2022: Pt. Is able to grasp 1 objects consistently,and continues to work on the hand patterns needed to grasp 1/2 objects. Eval: Pt. is able to grasp 1 objects intermittently using a lateral grasp pattern. Goal status: Ongoing  5.  Pt. will independently write his name legibly with letter sizes under 1. Baseline: 05/10/23: Deferred 03/08/23: Not addressed this period; 02/13/2023: Continue 01/09/2023: Continue. 12/28/2022: Pt. is now using an IPad Pen. 11/16/2022: Pt. Continues to be able to write name with smaller letter size. Pt. Is signing name with a computer styus. Pt. Requires visual cues, and assist 09/26/2022: Pt. Continues to be able to write name with smaller letter size. Pt. Is signing name with  a computer styus. Pt. Requires visual cues, and assist. 08/15/2022: Pt. Is able to write name with smaller letter size. Pt. Is signing name with a computer styus. Pt. Requires visual cues, and assist. 07/20/22: stabilizing assist to write name with 75% legibility with 2 letters. 05/25/2022: Pt. to continue to work towards formulating grasp patterns in preparation for grasping a large width pen. Pt. Requires visual cues. 03/23/2022: Pt. Is able to write his name with modA, however has difficulty with formulating letter sizes less than 2 in size with 50% legibility for the 3 letters of his name.03/21/2022: Pt. Is able to write his name with modA, however has difficulty with formulating letter sizes less than  2 in size with 50% legibility for the 3 letters of his name. Eval: Pt is able to hold a thin marker with his left hand, and formulate a line, and initiate a circular pattern (Pt. without glasses today) Goal status: Deferred  6. Pt. Will reach up to comb/brush his hair with minA.  Baseline: 05/10/23: Pt. is able to use a pick independently with the left hand with occasional assist to the far side of the left his head. 03/08/23: Pt uses pick in L hand to reach 75% of his head; unable to reach R side of head; 02/13/2023:Pt. is able to use a hair pick with the left hand on the right side of his head, top, and back of the head. Pt. Continues to have difficulty reaching further to the right side of his head with the left. 01/09/2023: Continue 12/28/2022: Pt. Is progressing, and is now able to use a hair pick with the left hand on the right side of his head, top, and back of the head. Pt. Continues to have difficulty reaching further to the right side of his head with the left. 11/16/2022: Pt. Is able to use a hair pick for the left side of his head using his left hand. Pt. Presents with difficulty reaching to the right side of his head.09/26/2022: Pt. Is able to use a hair pick for the left side of his head using his left hand. Pt. Presents with difficulty reaching to the right side of his head. 5/13/202: 75% using a hair pick, assist to the right side of the head. 07/20/22: reaches 75% of head, assist for far R side of head, fatigues quickly. 05/25/2022: Pt. Is able to reach up with the left hand to the left side, top, and back of his head. 03/23/2022: Pt. is now able to more consistently initiate reaching up to his head with his left hand in preparation for haircare12/18/2023: Pt. is now able to more consistently initiate reaching up to his head with his left hand in preparation for haircare. Eval: Pt. is able to initiate reaching up for hair care with a long handled brush, however is unable to sustain UE's in elevation  to perform the task.     Goal status: Achieved  7. Pt. Will independently navigate the w/c through his environment with minA with visual scanning, and hand placement on the controls.  Baseline: 11/16/2022: Deferred 2/2 vision 09/26/2022: Pt. Is now navigating his w/c ouside the home, and around his block. 08/15/2022: Pt. Requires minA to setup hand on controls and MIN cues to navigate the w/c in wide spaces, requires MIN - MOD cues to navigate the w/c through more narrow doorways, and tighter turns - varies based on good vs bad vision days.07/20/22: Pt. Requires minA to setup hand on controls and MIN cues to  navigate the w/c in wide spaces, requires MIN - MOD cues to navigate the w/c through more narrow doorways, and tighter turns - varies based on good vs bad vision days. 05/25/2022: Pt. Requires minA  and  cues to navigate the w/c in wide spaces, and requires Mod cues to navigate the w/c through more narrow doorways, and tighter turns. Pt. Requires max cues for scanning through the environment, and moderate cues for hand placement on the controls.     Goal status: Deferred 2/2 vision    8. Pt. Will improve bilateral grip strength to be able to independently grasp, and pull up blankets, and linens.   Baseline: 05/10/23: Deferred 03/08/23: R grip 0-5 lbs, L grip 8 lbs; pt reports he can consistently pull up blankets and linens independently.  02/13/2023: Continue. Pt. presents with digit PIP, and DIP flexor tightness. 01/09/2023: Continue 12/28/2023: R: 0#, L: 5# Pt. Is now able to more consistently grasp and pull blankets up over him. 11/16/2022: R: 0 L: 8# Pt. Is more consistently grasping his blankets and attempting to pull them up. 09/26/2022: R: 0  L: 8# Pt. is attempting to grasp, and pull blankets up more, using mostly the left hand. 08/15/2022: Pt. has difficulty securely holding, and pulling up blankets, and linens.    Goal status:Deferred  9. Pt. will consistently actively control the releasing of  blankets, covers, and linens from his hands once they are in the desired position over him. Baseline: 12/28/2022: Pt. Is consistently actively able to release linens once they are in the desired position over him. 11/16/2022: Pt. continues to improve with actively releasing blankets form his hands. 09/26/2022: Pt. is improving with actively releasing blankets form his hands. 08/15/2022: Pt. has difficulty with controlled releasing of blankets/linens from his grasp.     Goal status: Achieved    10. Pt. Will improve bilateral UE strength by 2 MM grades to assist with ADLs, and IADLs  Baseline: 01/08/24: Continue, carry goal over from recertification update last session. 12/27/23:  Right: shoulder flexion: 4/5, abduction: 4/5, elbow flexion: 5/5 , extension: 3-/5 wrist extension: 2-/5; Left: shoulder flexion: 4+/5, abduction: 4+/5, elbow flexion: 5/5 , extension: 4+/5  wrist extension: 3+/5  08/27/23:  Right: shoulder flexion: 3+/5, abduction: 4-/5, elbow flexion: 4+/5 , extension: 4-/5 wrist extension: 2/5; Left: shoulder flexion: 4+/5, abduction: 4+/5, elbow flexion: 5/5 , extension: 4+/5  wrist extension: 3-/5  10/23/2023: Right: shoulder flexion: 4-/5, abduction: 4-/5, elbow flexion: 4+/5 , extension: 4-/5 wrist extension: 2/5; Left: shoulder flexion: 4+/5, abduction: 4+/5, elbow flexion: 5/5 , extension: 4+/5  wrist extension: 3+/5  08/27/23:  Right: shoulder flexion: 3+/5, abduction: 4-/5, elbow flexion: 4+/5 , extension: 4-/5 wrist extension: 2/5; Left: shoulder flexion: 4+/5, abduction: 4+/5, elbow flexion: 5/5 , extension: 4/5  wrist extension: 3/5  08/07/23: Continue 05/31/2023: Continue 05/10/23: Right: shoulder flexion: 3+/5, abduction: 4-/5, elbow flexion: 4+/5 , extension: 4-/5 wrist extension: 2/5; Left: shoulder flexion: 4+/5, abduction: 4+/5, elbow flexion: 5/5 , extension: 4/5  wrist extension: 3/5 03/13/2023: Right: shoulder flexion: 3/5, abduction: 4/5, elbow flexion: 4+/5 , extension: 4-/5 wrist  extension: 2/5; Left: shoulder flexion: 4+/5, abduction: 4+/5, elbow flexion: 5/5 , extension: 4/5  wrist extension: 3/5 03/08/23: NT this date d/t as pt arrived late; 02/13/2023: Continue 01/09/2023: Continue 12/28/2022: Right: shoulder flexion: 3/5, abduction: 4/5, elbow flexion: 4+/5 , extension: 4-/5 wrist extension: 2/5; Left: shoulder flexion: 4+/5, abduction: 4+/5, elbow flexion: 5/5 , extension: 4/5  wrist extension: 3/5 Right: shoulder flexion: 3+/5, abduction: 4/5, elbow  flexion: 4+/5 , extension: 4+/5 wrist extension: 2/5; Left: shoulder flexion: 4+/5, abduction: 4+/5, elbow flexion: 5/5 , extension: 4+/5 wrist extension: 3-/5 11/16/2022: Right: shoulder flexion: 3/5, abduction: 4/5, elbow flexion: 4+/5 , extension: 4+/5 wrist extension: 2/5; Left: shoulder flexion: 4+/5, abduction: 4+/5, elbow flexion: 5/5 , extension:  wrist extension: 3-/5 Right: shoulder flexion: 3+/5, abduction: 4/5, elbow flexion: 4+/5 , extension: 4+/5 wrist extension: 2/5; Left: shoulder flexion: 4+/5, abduction: 4+/5, elbow flexion: 5/5 , extension: 4+/5 wrist extension: 3-/5 Goal status: Ongoing  11. Pt. will improve right wrist extension by 10 degrees to initiate reaching for items in preparation for ADLs. Baseline:01/08/24: Continue, carry goal over from recertification update last session.  12/27/23: Right wrist extension: -38(-6) 10/23/23: -30(-8) 08/23/2023: R Wrist Ext: -31(-8)  08/07/23: Continue 05/31/23: Continue 05/10/23: Right wrist extension: -32(22) 03/13/23: -22(35) 03/08/23: NT this date as pt arrived late; 02/13/2023: Continue 01/09/2023: Continue 12/28/2022: -22(34) 11/16/2022: -30(10)    Goal status: Ongoing      12. Pt. Will require donn/doff a Tank top T-shirt efficiently with supervision and full set-up.    Baseline: 01/08/24: Continue, carry goal over from recertification update last session. 12/27/23: Independent doffing, MaxA doffing 10/23/23: Independent doff the shirt/tank, MaxA  donning. 10/23/23: Pt. Is  able to independently doff his shirt, requiring assist to lean forward and loosen the back. 08/07/23: Continue 05/31/2023: Pt. Requires maxA donning a pullover sweatshirt, Mod-maxA doffing it. T-shirt tank TBD 05/10/23: Pt. Presents with difficulty completing, and requires increased time to complete    Goal status: Ongoing    13. Pt. Will independently perform bilateral/bimanual hand tasks needed to fold towels/linens/laundry Baseline: 01/08/24: Continue, carry goal over from recertification update last session. 12/27/23: Pt. Has not been able to work on the this  2/2 multiple cancellations this recertification 2/2 illness, familiy health issues,a nd transportation. 10/23/23: Pt. Continues to have difficulty folding laundry, and linens. 08/23/23: Pt. Continues to have difficulty using bilateral/bimanual hand tasks needed to fold towels/linens/laundry. 08/07/23: Continue 05/31/23: Continue 05/10/23:  Pt. Has difficulty folding towels/lines/laundry using bilateral hands Goal status:Ongoing  14. Pt. Will independently, and efficiently reach his right arm over the side of the chair to pet his dog.    Baseline: 08/23/2023: Pt. Is now able to reach over the side of the armrest of his recliner chair to pet his dog.  08/07/23: Continue 05/31/2023: Continue 05/10/23: Pt. is unable to efficiently reach his right UE over the side of his recliner chair to pet his dog.    Goal status: Achieved    15. Pt. Will independently, and efficiently reach out in front of him to efficiently place items onto his table while sitting in his recliner.     Baseline: 01/08/24: Continue, carry goal over from recertification update last session. 12/27/2023: Pt. is able to reach out to efficiently reach out in front of him to place items on the table 90% of the time with the LUE, 10% of the time with the RUE 2/2  increased flexor tone. 10/23/23: Pt. Is improving with functional reaching with the left UE out in front of him to place items onto the  table. Pt. has difficulty reaching out with the right hand to  place items on the table. 08/23/2023: Pt. has difficulty consistently reaching out in front of him to place items on the on a tabletop surface with the right hand. 08/07/23: Continue 2/26/225: Continue 05/10/23: Pt. has difficulty reaching out in front of him far enough to efficiently place items on the table  while sitting in a recliner chair.    Goal status: Continue/Ongoing     16. Pt. Will improve bilateral hand supination by 10 degrees to be able actively turn items in his hand to look at them independently and efficiently during ADLs/IADLs,    Baseline: 01/08/24: Continue, carry goal over from recertification update last session. 12/27/23: Right: Unable 2/2 increased tightness  Left: supination 20(38) 10/23/23: Right: unable, L: supination 30(38)    Goal status: Continue/Ongoing      17. Pt. Will independently, and efficiently transition between picking up finger foods for dipping, and  picking up utensils , then placing them down on the tabletop surface through 75% of the meal with minimal cues.    Baseline: 01/08/24: Continue, carry goal over from recertification update last session. 12/27/23: Pt. is able to consistently transition between picking up finger foods for dipping, and utensils consistently with the left hand with 100% accuracy time, and the right hand with 5% accuracy. 10/23/23: Pt. requires heavy cues, and assist to grasp finger foods, and utensils, and transition between the two.    Goal status: Continue/Ongoing   ASSESSMENT:  CLINICAL IMPRESSION:  Measurements were obtained, and goals were addressed, and updated at the last visit for recertification. Plan to continue with the same updated POC, and goals into this next progress reporting period.  Pt. continues to present with increased tightness throughout the right UE with increased tightness in the elbow flexors, forearm pronators, wrist, and digit PIP/DIP flexors.  Pt.continues to  present with tightness in the left forearm pronators, wrist, and digit PIP/DIP flexors. The tightness continues to limit his ability to efficiently reach out to place items on a table, transition between grasping finger foods and utensils when eating, releasing objects from the hand, grasping small objects, and moving them between his hands. Pt. Continues to be way overdue for Botox injections and is not scheduled for Botox until early November with is contributing to the tightness. Pt. continues to be very motivated to work towards regaining function in the bilateral hands. Pt.'s caregivers continue to be very supportive to continue working towards improving BUE functioning, ROM, strength, motor control, [facility] skills in order to maximize independence with daily ADLs, and IADL functioning. Plan to continue with the same goals into the next recertification period.   PERFORMANCE DEFICITS in functional skills including ADLs, IADLs, coordination, dexterity, proprioception, ROM, strength, pain, FMC, GMC, decreased knowledge of use of DME, and UE functional use, cognitive skills including safety awareness, and psychosocial skills including coping strategies, environmental adaptation, habits, and routines and behaviors.   IMPAIRMENTS are limiting patient from ADLs, IADLs, leisure, and social participation.   COMORBIDITIES may have co-morbidities  that affects occupational performance. Patient will benefit from skilled OT to address above impairments and improve overall function.  MODIFICATION OR ASSISTANCE TO COMPLETE EVALUATION: Maximum or significant modification of tasks or assist is necessary to complete an evaluation.  OT OCCUPATIONAL PROFILE AND HISTORY: Comprehensive assessment: Review of records and extensive additional review of physical, cognitive, psychosocial history related to current functional performance.  CLINICAL DECISION MAKING: High - multiple treatment options, significant  modification of task necessary  REHAB POTENTIAL: Fair    EVALUATION COMPLEXITY: High    PLAN: OT FREQUENCY: 2 x's a week  OT DURATION:12 weeks  PLANNED INTERVENTIONS: self care/ADL training, therapeutic exercise, therapeutic activity, neuromuscular re-education, manual therapy, passive range of motion, patient/family education, and cognitive remediation/compensation RECOMMENDED OTHER SERVICES: PT  CONSULTED AND AGREED WITH PLAN OF CARE: Patient and  family member/caregiver  PLAN FOR NEXT SESSION: Review HEP   Jerzi Tigert, MS, OTR/L  01/08/2024

## 2024-01-09 NOTE — Therapy (Signed)
 OUTPATIENT PHYSICAL THERAPY NEURO TREATMENT   Patient Name: Paul Hall MRN: [id number] DOB:[birth year], 28 y.o., male Today's Date: 01/09/2024   PCP:  Morene Sous, PA-C  REFERRING PROVIDER: Morene Sous, PA-C  END OF SESSION:  PT End of Session - 01/09/24 1053     Visit Number --    Number of Visits --    Date for Recertification  --    Authorization Type --    Authorization Time Period --    Progress Note Due on Visit --    PT Start Time --    PT Stop Time --    PT Time Calculation (min) --    Equipment Utilized During Treatment --    Activity Tolerance --    Behavior During Therapy --           Past Medical History:  Diagnosis Date   Diabetes mellitus (HCC)    Hypertension    Stroke [facility])    Past Surgical History:  Procedure Laterality Date   IVC FILTER PLACEMENT ([facility] HX)     LEG SURGERY     PEG TUBE PLACEMENT     TRACHEOSTOMY     Patient Active Problem List   Diagnosis Date Noted   Sepsis due to vancomycin  resistant Enterococcus species (HCC) 06/06/2019   SIRS (systemic inflammatory response syndrome) (HCC) 06/05/2019   Acute lower UTI 06/05/2019   VRE (vancomycin -resistant Enterococci) infection 06/05/2019   Anemia 06/05/2019   Skin ulcer of sacrum with necrosis of muscle (HCC)    Urinary retention    Type 2 diabetes mellitus without complication, with long-term current use of insulin  (HCC)    Tachycardia    Lower extremity edema    Acute metabolic encephalopathy    Obstructive sleep apnea    Morbid obesity with BMI of 60.0-69.9, adult (HCC)    Goals of care, counseling/discussion    Palliative care encounter    Sepsis (HCC) 04/27/2019   H/O insulin  dependent diabetes mellitus 04/27/2019   History of CVA with residual deficit 04/27/2019   Seizure disorder (HCC) 04/27/2019   Decubitus ulcer of sacral region, stage 4 (HCC) 04/27/2019    ONSET DATE: October 2020  REFERRING DIAG: Cerebral infarction, unspecified   THERAPY DIAG:   Muscle weakness (generalized)  Other lack of coordination  History of CVA with residual deficit  Difficulty in walking, not elsewhere classified  Unsteadiness on feet  Other abnormalities of gait and mobility  Abnormality of gait and mobility  Low vision, both eyes  Rationale for Evaluation and Treatment: Rehabilitation  SUBJECTIVE:  SUBJECTIVE STATEMENT: Patient complaining of some right ankle/foot pain. Stated it started this morning and can't relate it to anything he might have done.   Pt accompanied by: family member  PERTINENT HISTORY: Paul Hall is a 28 y/o male who presents with severe weakness, quadriparesis, altered sensorium, and visual impairment s/p critical illness and prolonged hospitalization. Pt hospitalized in October 2020 with ARDS 2/2 COVID19 infection. Pt sustained a complex and lengthy hospitalization which included tracheostomy, prolonged sedation, ECMO. In this period pt sustained CVA and SDH. Pt has now been liberated from tracheostomy and G-tube. Pt has since been hospitalized for wound infection and UTI. Pt lives with parents at home, has hospital bed and left chair, hoyer lift transfers, and power WC for mobility needs. Pt needs heavy physical assistance with ADL 2/2 BUE contractures and motor dysfunction   PAIN:  Are you having pain? No  PRECAUTIONS: Fall  RED FLAGS: None   WEIGHT BEARING RESTRICTIONS: No  FALLS: Has patient fallen in last 6 months? No  LIVING ENVIRONMENT: Lives with: lives with their family Lives in: House/apartment Stairs:  Has following equipment at home: Wheelchair (power) and hospital bed, hoyer lift, Sit to stand lift  PLOF: Independent  PATIENT GOALS: I want to walk  OBJECTIVE:  Note: Objective measures were completed at  Evaluation unless otherwise noted.  DIAGNOSTIC FINDINGS: CLINICAL DATA:  Altered mental status.  History of COVID and stroke.   EXAM: CT HEAD WITHOUT CONTRAST   TECHNIQUE: Contiguous axial images were obtained from the base of the skull through the vertex without intravenous contrast.   COMPARISON:  CT head 11/11/2012   FINDINGS: Brain: Left cerebral convexity cortical hypodensity with associated cortical calcification extending from the frontal to the parietal lobe. Extensive area of volume loss and low-density compatible with chronic infarction. This is noted on prior reports from [facility]. Probable chronic watershed infarct. Additional small areas of hypodensity in the right frontal white matter consistent with chronic ischemia.   Negative for acute infarct. No acute hemorrhage or mass. Ventricle size normal without midline shift.   Vascular: Normal arterial flow voids.   Skull: Negative   Sinuses/Orbits: Mucosal edema paranasal sinuses with air-fluid level left sphenoid sinus. Negative orbit   Other: None   IMPRESSION: Extensive watershed type infarct over the left cerebral convexity with cortical calcification. This appears chronic. No associated hemorrhage, hydrocephalus, or midline shift.   These results were called by telephone at the time of interpretation on 05/09/2019 at 3:12 pm to provider [name] , who verbally acknowledged these results.     Electronically Signed   By: [name] M.D.   On: 05/09/2019 15:13  COGNITION: Overall cognitive status: Impaired   SENSATION: Light touch: WFL  COORDINATION: Delayed with BLE  EDEMA:  None observed  MUSCLE TONE: RLE: Moderate  POSTURE: rounded shoulders and forward head  LOWER EXTREMITY ROM:     Active  Right Eval Left Eval  Hip flexion    Hip extension    Hip abduction    Hip adduction    Hip internal rotation    Hip external rotation    Knee flexion    Knee extension    Ankle  dorsiflexion    Ankle plantarflexion    Ankle inversion    Ankle eversion     (Blank rows = not tested)  LOWER EXTREMITY MMT:    MMT Right Eval Left Eval  Hip flexion    Hip extension    Hip abduction  Hip adduction    Hip internal rotation    Hip external rotation    Knee flexion    Knee extension    Ankle dorsiflexion    Ankle plantarflexion    Ankle inversion    Ankle eversion    (Blank rows = not tested)  BED MOBILITY:  Findings: Rolling to Right Max A Rolling to Left Max A  TRANSFERS: Sit to stand: Max A x 2 - just able to clear gluteals off mat  Assistive device utilized: HHA x2 to stand- dependent on hoyer for actual transfers     Stand to sit: Max A  Assistive device utilized: HHA x 2        GAIT: Findings: Non-Ambulatory  FUNCTIONAL TESTS:  None appropriate at this time  PATIENT SURVEYS:                                                                                                                                TREATMENT DATE: 01/08/2024   Quick assessment of right foot- tender in general on dorsum of foot, toes and achilles region.  Pain with PROM- DF/PF/IV/EV- deferred standing or activities with right LE  TE:  Ankle pumps LLE 2 x 10  Knee ext LLE 2 x 10  Knee flex (resistive) GTB 2 x 10  Hip add (resistive) GTB 2 x 10  Hip Abd (resistive) GTB 2 x 10  AAROM Seated Hip 2 x 10 reps  PROM to Left hip/knee/ankle x several min     PATIENT EDUCATION: Education details: Exercise technique; Plan for recert today Person educated: Patient and Parent Education method: Explanation Education comprehension: verbalized understanding  HOME EXERCISE PROGRAM: Access Code: [email or web address] URL: [email or web address] Date: 03/23/2022 Prepared by: Reyes London   Exercises - Supine Bridge  - 3 x weekly - 3 sets - 10 reps - 2 hold - Supine Gluteal Sets  - 3 x weekly - 3 sets - 10 reps - 5 sec hold - Supine Quad Set  - 1 x daily - 3 x  weekly - 3 sets - 10 reps - 5 hold - Seated Long Arc Quad  - 1 x daily - 7 x weekly - 3 sets - 10 reps - Seated Hip Adduction Squeeze with Ball  - 1 x daily - 3 x weekly - 3 sets - 10 reps - 5 hold - Seated Hip Abduction  - 1 x daily - 3 x weekly - 3 sets - 10 reps - 2 hold  GOALS: Goals reviewed with patient? Yes  SHORT TERM GOALS: Target date: 02/05/2024  Patient will be independent with updated HEP for improved overall LE strength in home Baseline: HEP continues to be updated as appropriate.  Goal status: NEW  LONG TERM GOALS: Target date: 03/18/2024 -Goal were updated/revised secondary to length of chart.   Patient will perform sit to stand transfer with appropriate AD and max assist of 2 people with 75% consistency to  prepare for pregait activities.  Baseline: 09/04/2023- Patient performed several Sit to stand requiring max A+2  with improving posture- able to ext R knee better - near terminal knee and able to clear bottom off mat- still not full but able to contract glutes for standing as erect as possible.  Goal status: ONGOING  2.  Patient will tolerate 2 minutes or more of standing in sit to stand lift or with max assist +2 in order to indicate improved lower extremity weightbearing tolerance for progression to standing in parallel bars  Baseline:  09/04/2023- Patient performed 3 trials of standing ranging from 17 -47 sec - improving right knee terminal knee ext and more activation upon command with gluteals- able to clear mat table. Continued to require Max A+2 and blocking knees with forearm/trunk support.   Goal status: ONGOING  3.  Patient will perform rolling left to right with Mod assist for improved independence with bed mobility to assist with dressing and positioning in bed.  Baseline: Dependent on caregiver to roll to left; 12/27/2023= Mod to max assist each direction (difficult due to RUE tone)  Goal status: Ongoing  4.  Patient will perform active bridging -able to clear  bottom   Off mat for 10 reps with PT/caregiver holding his feet for improved trunk ext strength with standing and to assist with mobility/positioning.  Baseline: Unable to perform 10 reps with consistent clearing of bottom; 12/27/2023 = Patient able to minimally clear his glues off mat table - but successfully 10 reps today.  Goal status: MET  5. Patient will perform active bridging -able to clear bottom   Off mat for 3 sets of 10 reps with PT/caregiver holding his feet for improved trunk ext strength with standing and to assist with mobility/positioning.  Baseline: 12/27/2023- Patient able to complete 10 reps with max effort  Goal status: New     ASSESSMENT:  CLINICAL IMPRESSION: Treatment was very limited today due to new complaint of R ankle/foot pain. No obvious injury, no open wound or sign of any trauma.  He was able to progress with more resistance with LLE today and participated well- still very limited ankle and hip ROM with most activity at knee.  Pt will continue to benefit from skilled physical therapy intervention to address impairments, improve QOL, and attain therapy goals.   OBJECTIVE IMPAIRMENTS: Abnormal gait, cardiopulmonary status limiting activity, decreased activity tolerance, decreased balance, decreased cognition, decreased coordination, decreased endurance, decreased knowledge of condition, decreased knowledge of use of DME, decreased mobility, difficulty walking, decreased ROM, decreased strength, hypomobility, impaired flexibility, impaired sensation, impaired UE functional use, impaired vision/preception, postural dysfunction, obesity, and pain.   ACTIVITY LIMITATIONS: carrying, lifting, bending, sitting, standing, squatting, stairs, transfers, bed mobility, bathing, toileting, dressing, self feeding, reach over head, and hygiene/grooming  PARTICIPATION LIMITATIONS: meal prep, cleaning, laundry, medication management, personal finances, interpersonal relationship,  driving, shopping, community activity, occupation, yard work, and church  PERSONAL FACTORS: Time since onset of injury/illness/exacerbation and 3+ comorbidities: DM, CVA, Seizures are also affecting patient's functional outcome.   REHAB POTENTIAL: Good  CLINICAL DECISION MAKING: Evolving/moderate complexity  EVALUATION COMPLEXITY: Moderate  PLAN:  PT FREQUENCY: 1-2x/week  PT DURATION: 12 weeks  PLANNED INTERVENTIONS: 97110-Therapeutic exercises, 97530- Therapeutic activity, 97112- Neuromuscular re-education, 97535- Self Care, [id number]- Manual therapy, and Patient/Family education  PLAN FOR NEXT SESSION:   Resume standing as able  Attempt standing frame if appropriate Nustep for UE/LE strengthening Mat table dynamic sitting activities.    Reyes LOISE London, PT  01/09/2024, 2:13 PM

## 2024-01-10 ENCOUNTER — Ambulatory Visit: Admitting: Occupational Therapy

## 2024-01-10 ENCOUNTER — Ambulatory Visit

## 2024-01-10 DIAGNOSIS — R269 Unspecified abnormalities of gait and mobility: Secondary | ICD-10-CM | POA: Diagnosis not present

## 2024-01-10 DIAGNOSIS — R2681 Unsteadiness on feet: Secondary | ICD-10-CM

## 2024-01-10 DIAGNOSIS — I6930 Unspecified sequelae of cerebral infarction: Secondary | ICD-10-CM

## 2024-01-10 DIAGNOSIS — R41841 Cognitive communication deficit: Secondary | ICD-10-CM | POA: Diagnosis not present

## 2024-01-10 DIAGNOSIS — R278 Other lack of coordination: Secondary | ICD-10-CM | POA: Diagnosis not present

## 2024-01-10 DIAGNOSIS — R262 Difficulty in walking, not elsewhere classified: Secondary | ICD-10-CM

## 2024-01-10 DIAGNOSIS — R2689 Other abnormalities of gait and mobility: Secondary | ICD-10-CM | POA: Diagnosis not present

## 2024-01-10 DIAGNOSIS — M6281 Muscle weakness (generalized): Secondary | ICD-10-CM

## 2024-01-10 DIAGNOSIS — H543 Unqualified visual loss, both eyes: Secondary | ICD-10-CM

## 2024-01-10 NOTE — Therapy (Signed)
 OUTPATIENT PHYSICAL THERAPY NEURO TREATMENT   Patient Name: Paul Hall MRN: [id number] DOB:1995-11-06, 28 y.o., male Today's Date: 01/11/2024   PCP:  Morene Sous, PA-C  REFERRING PROVIDER: Morene Sous, PA-C  END OF SESSION:  PT End of Session - 01/10/24 1427     Visit Number 179    Number of Visits 200   date corrected from most recent recert   Date for Recertification  03/18/24    Authorization Type [employer] [place] [employer]    Authorization Time Period 01/04/21-03/29/21; Recert 03/24/2021-06/16/2021; Recert 09/15/2021- 12/08/2021; Recert 12/13/2021-03/07/2022    Progress Note Due on Visit 180    PT Start Time 1616    PT Stop Time 1700    PT Time Calculation (min) 44 min    Equipment Utilized During Treatment Gait belt    Activity Tolerance Patient tolerated treatment well    Behavior During Therapy WFL for tasks assessed/performed            Past Medical History:  Diagnosis Date   Diabetes mellitus (HCC)    Hypertension    Stroke [facility])    Past Surgical History:  Procedure Laterality Date   IVC FILTER PLACEMENT ([facility] HX)     LEG SURGERY     PEG TUBE PLACEMENT     TRACHEOSTOMY     Patient Active Problem List   Diagnosis Date Noted   Sepsis due to vancomycin  resistant Enterococcus species (HCC) 06/06/2019   SIRS (systemic inflammatory response syndrome) (HCC) 06/05/2019   Acute lower UTI 06/05/2019   VRE (vancomycin -resistant Enterococci) infection 06/05/2019   Anemia 06/05/2019   Skin ulcer of sacrum with necrosis of muscle (HCC)    Urinary retention    Type 2 diabetes mellitus without complication, with long-term current use of insulin  (HCC)    Tachycardia    Lower extremity edema    Acute metabolic encephalopathy    Obstructive sleep apnea    Morbid obesity with BMI of 60.0-69.9, adult (HCC)    Goals of care, counseling/discussion    Palliative care encounter    Sepsis (HCC) 04/27/2019   H/O insulin  dependent diabetes mellitus 04/27/2019    History of CVA with residual deficit 04/27/2019   Seizure disorder (HCC) 04/27/2019   Decubitus ulcer of sacral region, stage 4 (HCC) 04/27/2019    ONSET DATE: October 2020  REFERRING DIAG: Cerebral infarction, unspecified   THERAPY DIAG:  Muscle weakness (generalized)  Other lack of coordination  Difficulty in walking, not elsewhere classified  History of CVA with residual deficit  Unsteadiness on feet  Other abnormalities of gait and mobility  Abnormality of gait and mobility  Low vision, both eyes  Rationale for Evaluation and Treatment: Rehabilitation  SUBJECTIVE:  SUBJECTIVE STATEMENT: Patient reports right foot feeling better. Not sure what happened. States feeling really tight and needing botox badly on right side.    Pt accompanied by: family member  PERTINENT HISTORY: Paul Hall is a 28 y/o male who presents with severe weakness, quadriparesis, altered sensorium, and visual impairment s/p critical illness and prolonged hospitalization. Pt hospitalized in October 2020 with ARDS 2/2 COVID19 infection. Pt sustained a complex and lengthy hospitalization which included tracheostomy, prolonged sedation, ECMO. In this period pt sustained CVA and SDH. Pt has now been liberated from tracheostomy and G-tube. Pt has since been hospitalized for wound infection and UTI. Pt lives with parents at home, has hospital bed and left chair, hoyer lift transfers, and power WC for mobility needs. Pt needs heavy physical assistance with ADL 2/2 BUE contractures and motor dysfunction   PAIN:  Are you having pain? No  PRECAUTIONS: Fall  RED FLAGS: None   WEIGHT BEARING RESTRICTIONS: No  FALLS: Has patient fallen in last 6 months? No  LIVING ENVIRONMENT: Lives with: lives with their family Lives  in: House/apartment Stairs:  Has following equipment at home: Wheelchair (power) and hospital bed, hoyer lift, Sit to stand lift  PLOF: Independent  PATIENT GOALS: I want to walk  OBJECTIVE:  Note: Objective measures were completed at Evaluation unless otherwise noted.  DIAGNOSTIC FINDINGS: CLINICAL DATA:  Altered mental status.  History of COVID and stroke.   EXAM: CT HEAD WITHOUT CONTRAST   TECHNIQUE: Contiguous axial images were obtained from the base of the skull through the vertex without intravenous contrast.   COMPARISON:  CT head 11/11/2012   FINDINGS: Brain: Left cerebral convexity cortical hypodensity with associated cortical calcification extending from the frontal to the parietal lobe. Extensive area of volume loss and low-density compatible with chronic infarction. This is noted on prior reports from [facility]. Probable chronic watershed infarct. Additional small areas of hypodensity in the right frontal white matter consistent with chronic ischemia.   Negative for acute infarct. No acute hemorrhage or mass. Ventricle size normal without midline shift.   Vascular: Normal arterial flow voids.   Skull: Negative   Sinuses/Orbits: Mucosal edema paranasal sinuses with air-fluid level left sphenoid sinus. Negative orbit   Other: None   IMPRESSION: Extensive watershed type infarct over the left cerebral convexity with cortical calcification. This appears chronic. No associated hemorrhage, hydrocephalus, or midline shift.   These results were called by telephone at the time of interpretation on 05/09/2019 at 3:12 pm to provider [name] , who verbally acknowledged these results.     Electronically Signed   By: [name] M.D.   On: 05/09/2019 15:13  COGNITION: Overall cognitive status: Impaired   SENSATION: Light touch: WFL  COORDINATION: Delayed with BLE  EDEMA:  None observed  MUSCLE TONE: RLE: Moderate  POSTURE: rounded shoulders and  forward head  LOWER EXTREMITY ROM:     Active  Right Eval Left Eval  Hip flexion    Hip extension    Hip abduction    Hip adduction    Hip internal rotation    Hip external rotation    Knee flexion    Knee extension    Ankle dorsiflexion    Ankle plantarflexion    Ankle inversion    Ankle eversion     (Blank rows = not tested)  LOWER EXTREMITY MMT:    MMT Right Eval Left Eval  Hip flexion    Hip extension    Hip abduction  Hip adduction    Hip internal rotation    Hip external rotation    Knee flexion    Knee extension    Ankle dorsiflexion    Ankle plantarflexion    Ankle inversion    Ankle eversion    (Blank rows = not tested)  BED MOBILITY:  Findings: Rolling to Right Max A Rolling to Left Max A  TRANSFERS: Sit to stand: Max A x 2 - just able to clear gluteals off mat  Assistive device utilized: HHA x2 to stand- dependent on hoyer for actual transfers     Stand to sit: Max A  Assistive device utilized: HHA x 2        GAIT: Findings: Non-Ambulatory  FUNCTIONAL TESTS:  None appropriate at this time  PATIENT SURVEYS:                                                                                                                                TREATMENT DATE: 01/08/2024   TE:  PROM TO bilateral hip flex/IR/ER/ABD/ADD and knee flex/ext and ankle ROM- approx 20 min in total Ankle pumps LLE 2 x 10 (AROM) and AAROM on RLE  Knee ext each LE 2 x 10  Knee flex (manual resist) 2 x 10 each LE Hip add (manual resist) 2 x 10 (5 sec hold) each LE Hip Abd active 2 x 10 each LE AROM-straight leg hip IR 2 x 10 reps each LE AAROM Seated Hip flex 2 x 10 reps each LE      PATIENT EDUCATION: Education details: Exercise technique; Plan for recert today Person educated: Patient and Parent Education method: Explanation Education comprehension: verbalized understanding  HOME EXERCISE PROGRAM: Access Code: [email or web address] URL:  [email or web address] Date: 03/23/2022 Prepared by: Reyes London   Exercises - Supine Bridge  - 3 x weekly - 3 sets - 10 reps - 2 hold - Supine Gluteal Sets  - 3 x weekly - 3 sets - 10 reps - 5 sec hold - Supine Quad Set  - 1 x daily - 3 x weekly - 3 sets - 10 reps - 5 hold - Seated Long Arc Quad  - 1 x daily - 7 x weekly - 3 sets - 10 reps - Seated Hip Adduction Squeeze with Ball  - 1 x daily - 3 x weekly - 3 sets - 10 reps - 5 hold - Seated Hip Abduction  - 1 x daily - 3 x weekly - 3 sets - 10 reps - 2 hold  GOALS: Goals reviewed with patient? Yes  SHORT TERM GOALS: Target date: 02/05/2024  Patient will be independent with updated HEP for improved overall LE strength in home Baseline: HEP continues to be updated as appropriate.  Goal status: NEW  LONG TERM GOALS: Target date: 03/18/2024 -Goal were updated/revised secondary to length of chart.   Patient will perform sit to stand transfer with appropriate AD and max assist of 2 people  with 75% consistency to prepare for pregait activities.  Baseline: 09/04/2023- Patient performed several Sit to stand requiring max A+2  with improving posture- able to ext R knee better - near terminal knee and able to clear bottom off mat- still not full but able to contract glutes for standing as erect as possible.  Goal status: ONGOING  2.  Patient will tolerate 2 minutes or more of standing in sit to stand lift or with max assist +2 in order to indicate improved lower extremity weightbearing tolerance for progression to standing in parallel bars  Baseline:  09/04/2023- Patient performed 3 trials of standing ranging from 17 -47 sec - improving right knee terminal knee ext and more activation upon command with gluteals- able to clear mat table. Continued to require Max A+2 and blocking knees with forearm/trunk support.   Goal status: ONGOING  3.  Patient will perform rolling left to right with Mod assist for improved independence with  bed mobility to assist with dressing and positioning in bed.  Baseline: Dependent on caregiver to roll to left; 12/27/2023= Mod to max assist each direction (difficult due to RUE tone)  Goal status: Ongoing  4.  Patient will perform active bridging -able to clear bottom   Off mat for 10 reps with PT/caregiver holding his feet for improved trunk ext strength with standing and to assist with mobility/positioning.  Baseline: Unable to perform 10 reps with consistent clearing of bottom; 12/27/2023 = Patient able to minimally clear his glues off mat table - but successfully 10 reps today.  Goal status: MET  5. Patient will perform active bridging -able to clear bottom   Off mat for 3 sets of 10 reps with PT/caregiver holding his feet for improved trunk ext strength with standing and to assist with mobility/positioning.  Baseline: 12/27/2023- Patient able to complete 10 reps with max effort  Goal status: New     ASSESSMENT:  CLINICAL IMPRESSION: Patient resumed activity on RLE today without report of pain. He did present with increased tightness but improved after stretching and reported feeling better. He was fatigued with 2 sets of 10 reps but able to complete with increased time. Pt will continue to benefit from skilled physical therapy intervention to address impairments, improve QOL, and attain therapy goals.   OBJECTIVE IMPAIRMENTS: Abnormal gait, cardiopulmonary status limiting activity, decreased activity tolerance, decreased balance, decreased cognition, decreased coordination, decreased endurance, decreased knowledge of condition, decreased knowledge of use of DME, decreased mobility, difficulty walking, decreased ROM, decreased strength, hypomobility, impaired flexibility, impaired sensation, impaired UE functional use, impaired vision/preception, postural dysfunction, obesity, and pain.   ACTIVITY LIMITATIONS: carrying, lifting, bending, sitting, standing, squatting, stairs, transfers, bed  mobility, bathing, toileting, dressing, self feeding, reach over head, and hygiene/grooming  PARTICIPATION LIMITATIONS: meal prep, cleaning, laundry, medication management, personal finances, interpersonal relationship, driving, shopping, community activity, occupation, yard work, and church  PERSONAL FACTORS: Time since onset of injury/illness/exacerbation and 3+ comorbidities: DM, CVA, Seizures are also affecting patient's functional outcome.   REHAB POTENTIAL: Good  CLINICAL DECISION MAKING: Evolving/moderate complexity  EVALUATION COMPLEXITY: Moderate  PLAN:  PT FREQUENCY: 1-2x/week  PT DURATION: 12 weeks  PLANNED INTERVENTIONS: 97110-Therapeutic exercises, 97530- Therapeutic activity, 97112- Neuromuscular re-education, 97535- Self Care, [id number]- Manual therapy, and Patient/Family education  PLAN FOR NEXT SESSION:   Resume standing as able  Attempt standing frame if appropriate Nustep for UE/LE strengthening Mat table dynamic sitting activities.    Reyes LOISE London, PT 01/11/2024, 2:33 PM

## 2024-01-10 NOTE — Therapy (Signed)
 . Occupational Therapy Neuro Treatment Note    Patient Name: Paul Hall MRN: [id number] DOB:08-Jul-1995, 28 y.o., male Today's Date: 01/10/2024  PCP: Dr. Bertell REFERRING PROVIDER: Dr. Bertell   .   OT End of Session - 01/10/24 1735     Visit Number [id number]    Number of Visits 180    Date for Recertification  03/20/24    OT Start Time 1537    OT Stop Time 1615    OT Time Calculation (min) 38 min    Equipment Utilized During Treatment tilt in space power wc    Activity Tolerance Patient tolerated treatment well    Behavior During Therapy WFL for tasks assessed/performed                  Past Medical History:  Diagnosis Date   Diabetes mellitus (HCC)    Hypertension    Stroke [facility])    Past Surgical History:  Procedure Laterality Date   IVC FILTER PLACEMENT ([facility] HX)     LEG SURGERY     PEG TUBE PLACEMENT     TRACHEOSTOMY     Patient Active Problem List   Diagnosis Date Noted   Sepsis due to vancomycin  resistant Enterococcus species (HCC) 06/06/2019   SIRS (systemic inflammatory response syndrome) (HCC) 06/05/2019   Acute lower UTI 06/05/2019   VRE (vancomycin -resistant Enterococci) infection 06/05/2019   Anemia 06/05/2019   Skin ulcer of sacrum with necrosis of muscle (HCC)    Urinary retention    Type 2 diabetes mellitus without complication, with long-term current use of insulin  (HCC)    Tachycardia    Lower extremity edema    Acute metabolic encephalopathy    Obstructive sleep apnea    Morbid obesity with BMI of 60.0-69.9, adult (HCC)    Goals of care, counseling/discussion    Palliative care encounter    Sepsis (HCC) 04/27/2019   H/O insulin  dependent diabetes mellitus 04/27/2019   History of CVA with residual deficit 04/27/2019   Seizure disorder (HCC) 04/27/2019   Decubitus ulcer of sacral region, stage 4 (HCC) 04/27/2019   ONSET DATE: 01/2019  REFERRING DIAG: CVA/COVID-19  THERAPY DIAG:  Muscle weakness (generalized)  Rationale for  Evaluation and Treatment Rehabilitation  SUBJECTIVE:    SUBJECTIVE STATEMENT:  Pt. Reports that they have a TV set-up in his room now with an ECHO.  Pt accompanied by: self and family member  PERTINENT HISTORY:  Pt. is a 28 y.o. male who was diagnosed with COVID-19, and CVA with resultant quadriplegia. Pt. Was then hospitalized with VRE UTI. PMHx includes: urinary retention, seizure disorder, obstructive sleep disorder, DM Type II, Morbid obesity.   PRECAUTIONS: None  WEIGHT BEARING RESTRICTIONS No  PAIN:  No reports of pain Are you having pain? 0/10  FALLS: Has patient fallen in last 6 months? No  LIVING ENVIRONMENT: Lives with: lives with their family Lives in: House/apartment Stairs: No Level Entry Has following equipment at home: Wheelchair (power) and hoyer lift, sit to stand lift  PLOF: Independent  PATIENT GOALS: To be able to engage in more daily care tasks.  OBJECTIVE:   HAND DOMINANCE: Right  ADLs:  Transfers/ambulation related to ADLs: Eating: Pt. reports being able to hold standard utensils, and is starting to engage more in self-feeding tasks, hand to mouth patterns. Pt. Reports that he does as much as he can with the task, and family assists  with the remainder of the task. Grooming: Pt. is able to initiate holding an electric  toothbrush, and brush his teeth. Family assists LB Dressing: Total Assist UE dressing: Pt. is now able to reach up to actively assist with grasping , and pulling his gown down. Toileting:  Total Assist Bathing: MaxA UB, Total assist LB Tub Shower transfers: N/A Equipment: See above    IADLs: Shopping: Relies on family to assist Light housekeeping: Total Assist Meal Prep: Total Assist Community mobility:   Medication management:  Total Assist  Financial management: N/A Handwriting: Not legible: Pt. Is able to hold a pen with the left hand, and initiate marking the page. Pt.'s eye glasses were not available  MOBILITY STATUS:   Power w/c  POSTURE COMMENTS:  Pt. Requires position changes in his power w/c  ACTIVITY TOLERANCE: Activity tolerance:  Fair  FUNCTIONAL OUTCOME MEASURES: FOTO: 40 TR score: 45  05/25/2022:   FOTO: 50 TR score: 45  07/20/22: FOTO 55  03/08/23: 51  UPPER EXTREMITY ROM     Active ROM Left eval Left  03/21/2022 Left 05/25/2022 L  07/20/22 Left 07/25/2022 Left  08/15/2022 Left  09/26/2022 Left  11/16/2022 Left 12/28/2022 Left 03/13/23 Left 05/10/23 Left 10/23/23 Left 12/27/23  Shoulder flexion 119 123 125 130  136 138 130 142 144 144 148 148  Shoulder abduction 110 115 115 115  118 121 125 142 140 140 140 140  Shoulder adduction               Shoulder extension               Shoulder internal rotation               Shoulder external rotation               Elbow flexion 135(135) 145 145 145  145    145 145  145  Elbow extension -27(-18) -21(-20) -20(-16)  -25(-10) -23(-10) -21(-10) -20(-18) -20(-18) -20(-17) -17(-16) -12(-8) -12(-6)  Wrist flexion               Wrist extension 10(50) 19(50) 28(50)  30(50) 35(55) 36(55) 36(60) 40(60) 45(60) 45(60) 42(54) 36(54)  Wrist ulnar deviation               Wrist radial deviation               Wrist pronation            60(74) 84(84)  Wrist supination   Limited by flexor tone       10(15)  30(38) 20(38)  (Blank rows = not tested)    Active ROM Right eval Right 03/21/2022 Right 05/25/2022 R  07/20/22 Right  07/25/22 Right 08/15/2022 Right 09/26/2022 Right 11/16/2022 Right 12/28/2022 Right 03/13/23 Right 05/10/23 Right 10/23/23 Right  9/24 /25  Shoulder flexion 106 scaption 105  Scaption 62 flexion 110 scaption 100  105 105 105 112 Scaption 123 Scaption 122  Scaption 142 Scaption 142  Shoulder abduction 114 90 97 100  105 117 120 124 124 122 144 146  Shoulder adduction               Shoulder extension               Shoulder internal rotation               Shoulder external rotation               Elbow flexion 120(130) 145 145 140   144 140 142 148 148 145  145  Elbow extension -45(-35) -22(-35) -28(-22)  -  32(-21) -32(-28) -28(-26) -28(-28) -27(-26) -27(-40) -38(-35) -34(-26) -34(-26)  Wrist flexion               Wrist extension -30(10) -25(10) -10(30)  -10(20) -10(25) -20(40) -30(10) -22(34) -22(35) -32(22) -30(-8) -38(-6)  Wrist ulnar deviation               Wrist radial deviation               Wrist pronation               Wrist supination   Limited by flexor tone                 12/28/2022: Thumb radial abduction: Right: 12(46), Left: 40(54)  Digits: Bilateral 2nd through 5th digit: MP Hyperextension with PIP, an DIP flexion    10/23/23:  Bilateral 2nd through 5th digit MP hyperextension with PIP, and DIP flexion: Pt. has been intermittently able to initate active diigt PIP, and DIP extension following Botox during this past recertification/progress reporting period.     UPPER EXTREMITY MMT:     Left eval Left 03/21/2022 Left  05/25/2022 L 07/20/22 Left  08/15/2022 Left 09/26/2022 Left 11/16/2022 Left 12/28/2022 Left  03/13/2023 Left  05/10/23 Left 10/23/23 Left 12/27/23  Shoulder flexion 3-/5 3/5 3+/5 4+/5 4+/5 4+/5 4+/5 4+/5 4+/5 4+/5 4+/5 4+/5  Shoulder abduction 3-/5 3-/5 4-/5 4+/5 4+/5 4+5 4+/5 4+/5 4+/5 4+/5 4+/5 4+/5  Shoulder adduction              Shoulder extension              Shoulder internal rotation              Shoulder external rotation              Elbow flexion 3/5 3+/5 4-/5 4+/5 5/5 5/5 5/5 5/5 5/5 5/5 5/5 5/5  Elbow extension 2-/5 2/5  4+/5 4+/5 4+/5  4/5 4/5 4/5 through available range 4+/5 through available range 4+/5  Wrist flexion              Wrist extension 2/5 2+/5 3-/5  3-/5 3-/5 3-/5 3/5 3/5 3/5 3+/5 3-/5  Wrist ulnar deviation              Wrist radial deviation              Wrist pronation              Wrist supination              (Blank rows = not tested)  Right eval Right 03/21/2022 Right  05/25/2022 R 07/20/22 Right 08/15/2022 Right 09/26/2022 Right 11/16/2022  Right 12/28/2022 Right 03/13/2023 Right 05/10/23 Right 10/23/23 Right 12/27/23  Shoulder flexion 3-/5 scaption 3-/5 3-/5 3+/5 3+/5 3+/5 3/5 3/5 3/5 3+/5 4-/5 4/5  Shoulder abduction 3-/5 3-/5 3-/5 4/5 4/5 4/5 4/5 4/5 4/5 4/-5 4-/5 4/5  Shoulder adduction              Shoulder extension              Shoulder internal rotation              Shoulder external rotation              Elbow flexion 3/5 3/5 3+/5 4/5 4+/5 4+/5 4+/5 4+/5 4+/5 4+/5 4+/5 5/5  Elbow extension 2-/5 2/5 3-/5 4+/5 4+/5 4+/5 4+/5 4-/5 4-/5 4-/5 4-/5 through available range 3-/5  Wrist flexion  Wrist extension 2-/5 2-/5 2/5 2/5 2/5 2/5 2/5 2/5 2/5 2/5 2/5 2-/5  Wrist ulnar deviation              Wrist radial deviation              Wrist pronation              Wrist supination                HAND FUNCTION:  Grip strength: Right: 0 lbs; Left: 0 lbs and Lateral pinch: Right: 5 lbs, Left: 2 lbs  03/21/2022: Lateral pinch: Right: 3.5 lbs, Left: 2 lbs  07/20/22: Grip strength: Right: 3 lbs; Left: 4 lbs and Lateral pinch: Right: 5 lbs, Left: 3 lbs  08/15/2022:  Grip strength: Right: 0 lbs; Left: 5 lbs and Lateral pinch: Right: 2 lbs, Left: 4 lbs  09/26/2022  Grip strength: Right: 0 lbs; Left: 8 lbs and Lateral pinch: Right: 2 lbs, Left: 5 lbs  11/16/2022  Grip strength: Right: 0 lbs; Left: 8 lbs  9/25/20204  Grip strength: Right: 0 lbs; Left: 8 lbs  Grip strength: Right: 0 lbs; Left: 8 lbs   03/08/23:  Grip strength: Right: 0 lbs without PROM, 5 lbs after PROM, Left 8 lbs  05/10/23:  Grip strength: TBD  Bilateral digit PIP/DIP flexion contractures with MP hyperextension with attempts for AROM. Pt. is able to tolerate AROM to the bilateral digits at the initial evaluation however, has a history of pain in the digits.  COORDINATION: Eval: Pt. is unable to grasp 9-hole test pegs. Pt. is able to initiate grasping larger pegs, and is able to hold a pen in the left hand.  07/20/22: 2 min 36 seconds to  remove 9 pegs from 9 hole peg test - cues to locate pegs 2/2 low vision. Pt. is able to initiate grasping larger pegs on R hand and is able to hold a pen in the left hand.  08/15/2022:    Vision, and sensation limiting accuracy of 9 hole peg test results. Pt. Was able to grasp and remove vertical pegs intermittently with cues.    05/10/23: TBD  SENSATION: Light touch: Impaired   EDEMA:  N/A  MUSCLE TONE: BUE flexor Spasticity  COGNITION Overall cognitive status: Continue to assess in functional context  VISION:   Subjective report: Pt. was not wearing glasses at the time of the initial eval.  Baseline vision: Vision is very limited. Wears glasses all the time Visual history: History of impaired vision following CVA. Pt. Has received treatment through the [facility] low vision rehabilitation program.   VISION ASSESSMENT: Impaired To be further assessed in functional context  PERCEPTION: Impaired   PRAXIS: Impaired: motor planning  OBSERVATIONS:  Pt reports being on Tramadol   TODAY'S TREATMENT 01/08/24   Therapeutic Ex.:    -BUE PROM was performed with emphasis placed on slow prolonged gentle stretching of distal RUE for elbow extension, and forearm supination, bilateral hands including: wrist/digit flexion/extension including MCPs, PIPs, DIPs of all digits, and wrist flexion/extension, and forearm supination in conjunction with moist heat modality 2/2 flexor tone, and tightness.   PATIENT EDUCATION: Education details: ROM, BUE tone  Person educated: Patient and Parent Education method: Explanation Education comprehension: verbalized understanding  HOME EXERCISE PROGRAM:    Incorporating  the right hand during self-feeding tasks.   GOALS: Goals reviewed with patient? Yes  SHORT TERM GOALS: Target date: 12/05/2023   To assess splint fit, and make appropriate adjustments to promote good  skin integrity through the palmar surface of the bilateral hands.  Baseline: 05/25/22:  Goal currently met, however ongoing as needs to assess splint fit arise. 03/23/2022: Pt. is wearing splints a couple of hours at night bilateral resting hand splints. 03/21/2022: Pt. is wearing splints a couple of hours at night bilateral resting hand splints. Goal status: Deferred   LONG TERM GOALS: Target date: 03/20/2024   FOTO score will Improve by 2 points for Pt. perceived improvement with the assessment specific ADL/IADL tasks.  Baseline: 03/08/23: 51; 12/28/2022: FOTO score: 56; 11/16/2022: 52 09/27/2022: 51 07/20/2022: FOTO 55 05/25/2022: FOTO score: 50,  TR score: 45 Eval: FOTO score: 40, TR score: 45 Goal status: Achieved   2.   Pt. will independently perform oral care for 100% of the task after complete set-up. Baseline: 05/10/23: Pt. Performs 90% of the task. Pt.'s mother assists with 10% of the task for thorough cleaning the hard to reach places way in the back of the oral cavity.03/08/23: not as much assist required proximally, but to adjust positioning of toothbrush; 02/13/23: Pt. Continues to require assist proximally 01/09/2023: Continue 12/28/2022:  Pt. Continues to complete 90% of the task with complete set-up, with visual cues, and occassional assist of the right elbow. 11/16/2022: Pt. Completes 90% of the task with complete set-up. 09/26/2022: Completes 90% of the task, proximal assist at times required at the elbow.  08/15/2022: Completes 90% of the task, proximal assist at times required at the elbow. 07/20/22: completes 90% of task, limited by shoulder flexion. 05/25/2022:  Pt. Is able to initiate and perform oral care for approximately 90% of the task. Complete set-up required. Assi needed only for the very back teeth. 03/23/2022: Pt. Is able to initiate and perform oral care for approximately 75% of the task. Pt. Requires assist at proximally at the elbow for through oral care. 03/21/2022: Pt. Is able to initiate and perform oral care for approximately 75% of the task. Pt. Requires assist at  proximally at the elbow for through oral care. Eval: Pt. is able to initiate using an electric toothbrush. Pt. requires assist for set-up, and assist for thoroughness, and as he Pt. fatigues. Goal status:  Partially met, Goal discontinued  3.  Pt. Will be modified independence with self-feeding for 100% using a swivel spoon, and standard fork after complete set-up Baseline: 05/10/2023: Pt. feeds self 95-100% of the meal. with occasional assist to reposition the utensil. 03/08/23: indep with cup with a handle and lid, assist to reposition eating utensils in hand;  02/13/23:  Pt. Is now able to consistently, and efficiently use a swivel spoon for eating cereal with the left hand.01/09/2023: Continue 12/28/2022: Pt. Is now feeding himself 90% of the time using a swivel spoon with the left hand, and 95% of the time with the fork. Pt. Continues to have difficulty with cutting food. 11/16/2022: 100% with finger foods, Pt. Is initiating with a swivel spoon, and universal cuff for fork use, however requires assist for consistency, and accuracy.  09/26/2022: 90% using the spoon, assist at time with the forearm motion, and grip on the utensils. 100% for finger foods.  08/15/2022:  90% using the spoon, assit at time with the forearm motion, and grip on the utensils. 100% for finger foods-independent with set-up- unsupervised.  07/20/22: self-feeds cereal using spoon 90% of task. 05/25/2022: Pt. Is able to use a spoon to scoop cereal when feeding himself cereal 85% of the time. Pt. Is able to feed himself snack/finger foods 100% of  the time. Pt. Continues to work on consistency of  stabilizing a cup/mug when drinking. Pt. Is able to grasp a water  bottle with assist initially, with assist tapering off as he drinks.03/23/2022: Pt. Is able to perform scooping cereal for 75% of the time. Pt. required assist, and support at the left elbow, and Pt. Presents with limited forearm supination when using the spoon, and bringing it towards his  mouth. Pt. Is able to use a fork to spear items, and perform the hand to mouth pattern.  03/21/2022: Pt. Is able to perform scooping cereal for 75% of the time. Pt. required assist, and support at the left elbow, and Pt. presents with limited forearm supination when using the spoon, and bringing it towards his mouth. Pt. Is able to use a fork to spear items, and perform the hand to mouth pattern.  Eval: Pt. is able to hold standard standard utensils. Pt. Performs as much of the task as he, can and has assistance for the remainder. Goal status:  Improved, Achieved  4.  Pt. will improve grasp patterns and consistently grasp 1/4 objects for ADL, and IADL tasks.  Baseline: 01/08/24: Continue, carry goal over from recertification update last session. 12/27/23: Pt. is able to present with increased tightness  2/2 being way overdue for Botox injections, which limits consistency with grasping small objects. 10/23/2023: Pt. is able to initiate grasping small objects visual cues, and increased time, however continues to work towards grasping 1/4 objects efficiently. 08/27/23: Pt. is able to grasp flat discs, and sustain a secure hold on them while moving the UE through various planes. 08/07/23: Continue 05/31/2023: Continue 05/10/23: Pt. is consistently able to grasp 1/2 objects with visual cues. Pt. Requires assist, and increased visual cues to grasp 1/4 objects on an elevated plane.  Pt. Is unable to grasp the flat objects at the tabletop surface. Pt. Is grasping grasping small puppy vitamins.03/08/23: 1/2 objects efficiently; 02/13/2023: 1/2 objects efficiently 01/09/2023: Continue 12/28/2022: Continues to grasp 1/4 pegs with min a + visual cues, consistently grasping 1/2 objects with visual cues 11/16/2022: Continues to grasp 1/4 pegs with min a + visual cues, consistently grasping 1/2 objects with visual cues 09/26/2022: Continues to grasp 1/4 pegs with min a + visual cues, consistently grasping 1/2 objects with  visual cues. 08/15/2022: grasps 1/4 pegs with min a + visual cues, consistently grasping 1/2 objects with visual cues. 07/20/22: grasps 1/4 pegs with min a + visual cues, consistently grasping 1/2 objects with visual cues. 05/25/2022: Pt. Is working on improving consistency of grasping 1/2 objects with visual cues.  03/23/2022: Pt. Is able to grasp 1 objects consistently,and continues to work on the hand patterns needed to grasp 1/2 objects.03/21/2022: Pt. Is able to grasp 1 objects consistently,and continues to work on the hand patterns needed to grasp 1/2 objects. Eval: Pt. is able to grasp 1 objects intermittently using a lateral grasp pattern. Goal status: Ongoing  5.  Pt. will independently write his name legibly with letter sizes under 1. Baseline: 05/10/23: Deferred 03/08/23: Not addressed this period; 02/13/2023: Continue 01/09/2023: Continue. 12/28/2022: Pt. is now using an IPad Pen. 11/16/2022: Pt. Continues to be able to write name with smaller letter size. Pt. Is signing name with a computer styus. Pt. Requires visual cues, and assist 09/26/2022: Pt. Continues to be able to write name with smaller letter size. Pt. Is signing name with a computer styus. Pt. Requires visual cues, and assist. 08/15/2022: Pt. Is able to write name with smaller letter  size. Pt. Is signing name with a computer styus. Pt. Requires visual cues, and assist. 07/20/22: stabilizing assist to write name with 75% legibility with 2 letters. 05/25/2022: Pt. to continue to work towards formulating grasp patterns in preparation for grasping a large width pen. Pt. Requires visual cues. 03/23/2022: Pt. Is able to write his name with modA, however has difficulty with formulating letter sizes less than 2 in size with 50% legibility for the 3 letters of his name.03/21/2022: Pt. Is able to write his name with modA, however has difficulty with formulating letter sizes less than 2 in size with 50% legibility for the 3 letters of his name.  Eval: Pt is able to hold a thin marker with his left hand, and formulate a line, and initiate a circular pattern (Pt. without glasses today) Goal status: Deferred  6. Pt. Will reach up to comb/brush his hair with minA.  Baseline: 05/10/23: Pt. is able to use a pick independently with the left hand with occasional assist to the far side of the left his head. 03/08/23: Pt uses pick in L hand to reach 75% of his head; unable to reach R side of head; 02/13/2023:Pt. is able to use a hair pick with the left hand on the right side of his head, top, and back of the head. Pt. Continues to have difficulty reaching further to the right side of his head with the left. 01/09/2023: Continue 12/28/2022: Pt. Is progressing, and is now able to use a hair pick with the left hand on the right side of his head, top, and back of the head. Pt. Continues to have difficulty reaching further to the right side of his head with the left. 11/16/2022: Pt. Is able to use a hair pick for the left side of his head using his left hand. Pt. Presents with difficulty reaching to the right side of his head.09/26/2022: Pt. Is able to use a hair pick for the left side of his head using his left hand. Pt. Presents with difficulty reaching to the right side of his head. 5/13/202: 75% using a hair pick, assist to the right side of the head. 07/20/22: reaches 75% of head, assist for far R side of head, fatigues quickly. 05/25/2022: Pt. Is able to reach up with the left hand to the left side, top, and back of his head. 03/23/2022: Pt. is now able to more consistently initiate reaching up to his head with his left hand in preparation for haircare12/18/2023: Pt. is now able to more consistently initiate reaching up to his head with his left hand in preparation for haircare. Eval: Pt. is able to initiate reaching up for hair care with a long handled brush, however is unable to sustain UE's in elevation to perform the task.     Goal status: Achieved  7. Pt. Will  independently navigate the w/c through his environment with minA with visual scanning, and hand placement on the controls.  Baseline: 11/16/2022: Deferred 2/2 vision 09/26/2022: Pt. Is now navigating his w/c ouside the home, and around his block. 08/15/2022: Pt. Requires minA to setup hand on controls and MIN cues to navigate the w/c in wide spaces, requires MIN - MOD cues to navigate the w/c through more narrow doorways, and tighter turns - varies based on good vs bad vision days.07/20/22: Pt. Requires minA to setup hand on controls and MIN cues to navigate the w/c in wide spaces, requires MIN - MOD cues to navigate the w/c through more narrow doorways,  and tighter turns - varies based on good vs bad vision days. 05/25/2022: Pt. Requires minA  and  cues to navigate the w/c in wide spaces, and requires Mod cues to navigate the w/c through more narrow doorways, and tighter turns. Pt. Requires max cues for scanning through the environment, and moderate cues for hand placement on the controls.     Goal status: Deferred 2/2 vision    8. Pt. Will improve bilateral grip strength to be able to independently grasp, and pull up blankets, and linens.   Baseline: 05/10/23: Deferred 03/08/23: R grip 0-5 lbs, L grip 8 lbs; pt reports he can consistently pull up blankets and linens independently.  02/13/2023: Continue. Pt. presents with digit PIP, and DIP flexor tightness. 01/09/2023: Continue 12/28/2023: R: 0#, L: 5# Pt. Is now able to more consistently grasp and pull blankets up over him. 11/16/2022: R: 0 L: 8# Pt. Is more consistently grasping his blankets and attempting to pull them up. 09/26/2022: R: 0  L: 8# Pt. is attempting to grasp, and pull blankets up more, using mostly the left hand. 08/15/2022: Pt. has difficulty securely holding, and pulling up blankets, and linens.    Goal status:Deferred  9. Pt. will consistently actively control the releasing of blankets, covers, and linens from his hands once they are in the  desired position over him. Baseline: 12/28/2022: Pt. Is consistently actively able to release linens once they are in the desired position over him. 11/16/2022: Pt. continues to improve with actively releasing blankets form his hands. 09/26/2022: Pt. is improving with actively releasing blankets form his hands. 08/15/2022: Pt. has difficulty with controlled releasing of blankets/linens from his grasp.     Goal status: Achieved    10. Pt. Will improve bilateral UE strength by 2 MM grades to assist with ADLs, and IADLs  Baseline: 01/08/24: Continue, carry goal over from recertification update last session. 12/27/23:  Right: shoulder flexion: 4/5, abduction: 4/5, elbow flexion: 5/5 , extension: 3-/5 wrist extension: 2-/5; Left: shoulder flexion: 4+/5, abduction: 4+/5, elbow flexion: 5/5 , extension: 4+/5  wrist extension: 3+/5  08/27/23:  Right: shoulder flexion: 3+/5, abduction: 4-/5, elbow flexion: 4+/5 , extension: 4-/5 wrist extension: 2/5; Left: shoulder flexion: 4+/5, abduction: 4+/5, elbow flexion: 5/5 , extension: 4+/5  wrist extension: 3-/5  10/23/2023: Right: shoulder flexion: 4-/5, abduction: 4-/5, elbow flexion: 4+/5 , extension: 4-/5 wrist extension: 2/5; Left: shoulder flexion: 4+/5, abduction: 4+/5, elbow flexion: 5/5 , extension: 4+/5  wrist extension: 3+/5  08/27/23:  Right: shoulder flexion: 3+/5, abduction: 4-/5, elbow flexion: 4+/5 , extension: 4-/5 wrist extension: 2/5; Left: shoulder flexion: 4+/5, abduction: 4+/5, elbow flexion: 5/5 , extension: 4/5  wrist extension: 3/5  08/07/23: Continue 05/31/2023: Continue 05/10/23: Right: shoulder flexion: 3+/5, abduction: 4-/5, elbow flexion: 4+/5 , extension: 4-/5 wrist extension: 2/5; Left: shoulder flexion: 4+/5, abduction: 4+/5, elbow flexion: 5/5 , extension: 4/5  wrist extension: 3/5 03/13/2023: Right: shoulder flexion: 3/5, abduction: 4/5, elbow flexion: 4+/5 , extension: 4-/5 wrist extension: 2/5; Left: shoulder flexion: 4+/5, abduction: 4+/5, elbow  flexion: 5/5 , extension: 4/5  wrist extension: 3/5 03/08/23: NT this date d/t as pt arrived late; 02/13/2023: Continue 01/09/2023: Continue 12/28/2022: Right: shoulder flexion: 3/5, abduction: 4/5, elbow flexion: 4+/5 , extension: 4-/5 wrist extension: 2/5; Left: shoulder flexion: 4+/5, abduction: 4+/5, elbow flexion: 5/5 , extension: 4/5  wrist extension: 3/5 Right: shoulder flexion: 3+/5, abduction: 4/5, elbow flexion: 4+/5 , extension: 4+/5 wrist extension: 2/5; Left: shoulder flexion: 4+/5, abduction: 4+/5, elbow flexion: 5/5 , extension:  4+/5 wrist extension: 3-/5 11/16/2022: Right: shoulder flexion: 3/5, abduction: 4/5, elbow flexion: 4+/5 , extension: 4+/5 wrist extension: 2/5; Left: shoulder flexion: 4+/5, abduction: 4+/5, elbow flexion: 5/5 , extension:  wrist extension: 3-/5 Right: shoulder flexion: 3+/5, abduction: 4/5, elbow flexion: 4+/5 , extension: 4+/5 wrist extension: 2/5; Left: shoulder flexion: 4+/5, abduction: 4+/5, elbow flexion: 5/5 , extension: 4+/5 wrist extension: 3-/5 Goal status: Ongoing  11. Pt. will improve right wrist extension by 10 degrees to initiate reaching for items in preparation for ADLs. Baseline:01/08/24: Continue, carry goal over from recertification update last session.  12/27/23: Right wrist extension: -38(-6) 10/23/23: -30(-8) 08/23/2023: R Wrist Ext: -31(-8)  08/07/23: Continue 05/31/23: Continue 05/10/23: Right wrist extension: -32(22) 03/13/23: -22(35) 03/08/23: NT this date as pt arrived late; 02/13/2023: Continue 01/09/2023: Continue 12/28/2022: -22(34) 11/16/2022: -30(10)    Goal status: Ongoing      12. Pt. Will require donn/doff a Tank top T-shirt efficiently with supervision and full set-up.    Baseline: 01/08/24: Continue, carry goal over from recertification update last session. 12/27/23: Independent doffing, MaxA doffing 10/23/23: Independent doff the shirt/tank, MaxA  donning. 10/23/23: Pt. Is able to independently doff his shirt, requiring assist to lean forward  and loosen the back. 08/07/23: Continue 05/31/2023: Pt. Requires maxA donning a pullover sweatshirt, Mod-maxA doffing it. T-shirt tank TBD 05/10/23: Pt. Presents with difficulty completing, and requires increased time to complete    Goal status: Ongoing    13. Pt. Will independently perform bilateral/bimanual hand tasks needed to fold towels/linens/laundry Baseline: 01/08/24: Continue, carry goal over from recertification update last session. 12/27/23: Pt. Has not been able to work on the this  2/2 multiple cancellations this recertification 2/2 illness, familiy health issues,a nd transportation. 10/23/23: Pt. Continues to have difficulty folding laundry, and linens. 08/23/23: Pt. Continues to have difficulty using bilateral/bimanual hand tasks needed to fold towels/linens/laundry. 08/07/23: Continue 05/31/23: Continue 05/10/23:  Pt. Has difficulty folding towels/lines/laundry using bilateral hands Goal status:Ongoing  14. Pt. Will independently, and efficiently reach his right arm over the side of the chair to pet his dog.    Baseline: 08/23/2023: Pt. Is now able to reach over the side of the armrest of his recliner chair to pet his dog.  08/07/23: Continue 05/31/2023: Continue 05/10/23: Pt. is unable to efficiently reach his right UE over the side of his recliner chair to pet his dog.    Goal status: Achieved    15. Pt. Will independently, and efficiently reach out in front of him to efficiently place items onto his table while sitting in his recliner.     Baseline: 01/08/24: Continue, carry goal over from recertification update last session. 12/27/2023: Pt. is able to reach out to efficiently reach out in front of him to place items on the table 90% of the time with the LUE, 10% of the time with the RUE 2/2  increased flexor tone. 10/23/23: Pt. Is improving with functional reaching with the left UE out in front of him to place items onto the table. Pt. has difficulty reaching out with the right hand to  place items  on the table. 08/23/2023: Pt. has difficulty consistently reaching out in front of him to place items on the on a tabletop surface with the right hand. 08/07/23: Continue 2/26/225: Continue 05/10/23: Pt. has difficulty reaching out in front of him far enough to efficiently place items on the table while sitting in a recliner chair.    Goal status: Continue/Ongoing     16. Pt. Will  improve bilateral hand supination by 10 degrees to be able actively turn items in his hand to look at them independently and efficiently during ADLs/IADLs,    Baseline: 01/08/24: Continue, carry goal over from recertification update last session. 12/27/23: Right: Unable 2/2 increased tightness  Left: supination 20(38) 10/23/23: Right: unable, L: supination 30(38)    Goal status: Continue/Ongoing      17. Pt. Will independently, and efficiently transition between picking up finger foods for dipping, and  picking up utensils , then placing them down on the tabletop surface through 75% of the meal with minimal cues.    Baseline: 01/08/24: Continue, carry goal over from recertification update last session. 12/27/23: Pt. is able to consistently transition between picking up finger foods for dipping, and utensils consistently with the left hand with 100% accuracy time, and the right hand with 5% accuracy. 10/23/23: Pt. requires heavy cues, and assist to grasp finger foods, and utensils, and transition between the two.    Goal status: Continue/Ongoing   ASSESSMENT:  CLINICAL IMPRESSION:  Pt. presents with increased tightness with right forearm supination, as well as bilateral wrist, and digit extension 2/2 to increased flexor tone, and tightness. Pt. Is way overdue for Botox injections and is not scheduled for Botox until early November with is contributing to the tightness. Pt. Tolerated ROM , and moist heat however does present with intermittent discomfort during stretching 2/2 tightness.  Pt. Reports that his bilateral  UEs/hands/digits feel better following ROM and stretching. Pt. continues to be very motivated to work towards regaining function in the bilateral hands. Pt.'s caregivers continue to be very supportive to continue working towards improving BUE functioning, ROM, strength, motor control, [facility] skills in order to maximize independence with daily ADLs, and IADL functioning. Plan to continue with the same goals into the next recertification period.   PERFORMANCE DEFICITS in functional skills including ADLs, IADLs, coordination, dexterity, proprioception, ROM, strength, pain, FMC, GMC, decreased knowledge of use of DME, and UE functional use, cognitive skills including safety awareness, and psychosocial skills including coping strategies, environmental adaptation, habits, and routines and behaviors.   IMPAIRMENTS are limiting patient from ADLs, IADLs, leisure, and social participation.   COMORBIDITIES may have co-morbidities  that affects occupational performance. Patient will benefit from skilled OT to address above impairments and improve overall function.  MODIFICATION OR ASSISTANCE TO COMPLETE EVALUATION: Maximum or significant modification of tasks or assist is necessary to complete an evaluation.  OT OCCUPATIONAL PROFILE AND HISTORY: Comprehensive assessment: Review of records and extensive additional review of physical, cognitive, psychosocial history related to current functional performance.  CLINICAL DECISION MAKING: High - multiple treatment options, significant modification of task necessary  REHAB POTENTIAL: Fair    EVALUATION COMPLEXITY: High    PLAN: OT FREQUENCY: 2 x's a week  OT DURATION:12 weeks  PLANNED INTERVENTIONS: self care/ADL training, therapeutic exercise, therapeutic activity, neuromuscular re-education, manual therapy, passive range of motion, patient/family education, and cognitive remediation/compensation RECOMMENDED OTHER SERVICES: PT  CONSULTED AND AGREED WITH PLAN  OF CARE: Patient and family member/caregiver  PLAN FOR NEXT SESSION: Review HEP   Richardson Otter, MS, OTR/L  01/10/2024

## 2024-01-15 ENCOUNTER — Ambulatory Visit: Admitting: Occupational Therapy

## 2024-01-15 ENCOUNTER — Ambulatory Visit

## 2024-01-15 DIAGNOSIS — I6389 Other cerebral infarction: Secondary | ICD-10-CM | POA: Diagnosis not present

## 2024-01-15 DIAGNOSIS — R32 Unspecified urinary incontinence: Secondary | ICD-10-CM | POA: Diagnosis not present

## 2024-01-17 ENCOUNTER — Ambulatory Visit: Admitting: Occupational Therapy

## 2024-01-17 ENCOUNTER — Ambulatory Visit

## 2024-01-17 DIAGNOSIS — R2681 Unsteadiness on feet: Secondary | ICD-10-CM | POA: Diagnosis not present

## 2024-01-17 DIAGNOSIS — R269 Unspecified abnormalities of gait and mobility: Secondary | ICD-10-CM

## 2024-01-17 DIAGNOSIS — R278 Other lack of coordination: Secondary | ICD-10-CM

## 2024-01-17 DIAGNOSIS — R2689 Other abnormalities of gait and mobility: Secondary | ICD-10-CM | POA: Diagnosis not present

## 2024-01-17 DIAGNOSIS — H543 Unqualified visual loss, both eyes: Secondary | ICD-10-CM | POA: Diagnosis not present

## 2024-01-17 DIAGNOSIS — M6281 Muscle weakness (generalized): Secondary | ICD-10-CM

## 2024-01-17 DIAGNOSIS — I6930 Unspecified sequelae of cerebral infarction: Secondary | ICD-10-CM

## 2024-01-17 DIAGNOSIS — R262 Difficulty in walking, not elsewhere classified: Secondary | ICD-10-CM

## 2024-01-17 DIAGNOSIS — R41841 Cognitive communication deficit: Secondary | ICD-10-CM

## 2024-01-17 NOTE — Therapy (Addendum)
 . Occupational Therapy Neuro Treatment Note    Patient Name: Paul Hall MRN: [id number] DOB:08-13-1995, 28 y.o., male Today's Date: 01/17/2024  PCP: Dr. Bertell REFERRING PROVIDER: Dr. Bertell   .   OT End of Session - 01/17/24 1659     Visit Number [id number]    Number of Visits 180    Date for Recertification  03/20/24    OT Start Time 1530    OT Stop Time 1615    OT Time Calculation (min) 45 min    Equipment Utilized During Treatment tilt in space power wc    Activity Tolerance Patient tolerated treatment well    Behavior During Therapy WFL for tasks assessed/performed                  Past Medical History:  Diagnosis Date   Diabetes mellitus (HCC)    Hypertension    Stroke [facility])    Past Surgical History:  Procedure Laterality Date   IVC FILTER PLACEMENT ([facility] HX)     LEG SURGERY     PEG TUBE PLACEMENT     TRACHEOSTOMY     Patient Active Problem List   Diagnosis Date Noted   Sepsis due to vancomycin  resistant Enterococcus species (HCC) 06/06/2019   SIRS (systemic inflammatory response syndrome) (HCC) 06/05/2019   Acute lower UTI 06/05/2019   VRE (vancomycin -resistant Enterococci) infection 06/05/2019   Anemia 06/05/2019   Skin ulcer of sacrum with necrosis of muscle (HCC)    Urinary retention    Type 2 diabetes mellitus without complication, with long-term current use of insulin  (HCC)    Tachycardia    Lower extremity edema    Acute metabolic encephalopathy    Obstructive sleep apnea    Morbid obesity with BMI of 60.0-69.9, adult (HCC)    Goals of care, counseling/discussion    Palliative care encounter    Sepsis (HCC) 04/27/2019   H/O insulin  dependent diabetes mellitus 04/27/2019   History of CVA with residual deficit 04/27/2019   Seizure disorder (HCC) 04/27/2019   Decubitus ulcer of sacral region, stage 4 (HCC) 04/27/2019   ONSET DATE: 01/2019  REFERRING DIAG: CVA/COVID-19  THERAPY DIAG:  Muscle weakness (generalized)  Rationale for  Evaluation and Treatment Rehabilitation  SUBJECTIVE:    SUBJECTIVE STATEMENT:  Pt. reports having had abdominal cramping earlier in the week.  Pt accompanied by: self and family member  PERTINENT HISTORY:  Pt. is a 28 y.o. male who was diagnosed with COVID-19, and CVA with resultant quadriplegia. Pt. Was then hospitalized with VRE UTI. PMHx includes: urinary retention, seizure disorder, obstructive sleep disorder, DM Type II, Morbid obesity.   PRECAUTIONS: None  WEIGHT BEARING RESTRICTIONS No  PAIN:  No reports of pain Are you having pain? 0/10  FALLS: Has patient fallen in last 6 months? No  LIVING ENVIRONMENT: Lives with: lives with their family Lives in: House/apartment Stairs: No Level Entry Has following equipment at home: Wheelchair (power) and hoyer lift, sit to stand lift  PLOF: Independent  PATIENT GOALS: To be able to engage in more daily care tasks.  OBJECTIVE:   HAND DOMINANCE: Right  ADLs:  Transfers/ambulation related to ADLs: Eating: Pt. reports being able to hold standard utensils, and is starting to engage more in self-feeding tasks, hand to mouth patterns. Pt. Reports that he does as much as he can with the task, and family assists  with the remainder of the task. Grooming: Pt. is able to initiate holding an electric toothbrush, and brush his teeth.  Family assists LB Dressing: Total Assist UE dressing: Pt. is now able to reach up to actively assist with grasping , and pulling his gown down. Toileting:  Total Assist Bathing: MaxA UB, Total assist LB Tub Shower transfers: N/A Equipment: See above    IADLs: Shopping: Relies on family to assist Light housekeeping: Total Assist Meal Prep: Total Assist Community mobility:   Medication management:  Total Assist  Financial management: N/A Handwriting: Not legible: Pt. Is able to hold a pen with the left hand, and initiate marking the page. Pt.'s eye glasses were not available  MOBILITY STATUS:   Power w/c  POSTURE COMMENTS:  Pt. Requires position changes in his power w/c  ACTIVITY TOLERANCE: Activity tolerance:  Fair  FUNCTIONAL OUTCOME MEASURES: FOTO: 40 TR score: 45  05/25/2022:   FOTO: 50 TR score: 45  07/20/22: FOTO 55  03/08/23: 51  UPPER EXTREMITY ROM     Active ROM Left eval Left  03/21/2022 Left 05/25/2022 L  07/20/22 Left 07/25/2022 Left  08/15/2022 Left  09/26/2022 Left  11/16/2022 Left 12/28/2022 Left 03/13/23 Left 05/10/23 Left 10/23/23 Left 12/27/23  Shoulder flexion 119 123 125 130  136 138 130 142 144 144 148 148  Shoulder abduction 110 115 115 115  118 121 125 142 140 140 140 140  Shoulder adduction               Shoulder extension               Shoulder internal rotation               Shoulder external rotation               Elbow flexion 135(135) 145 145 145  145    145 145  145  Elbow extension -27(-18) -21(-20) -20(-16)  -25(-10) -23(-10) -21(-10) -20(-18) -20(-18) -20(-17) -17(-16) -12(-8) -12(-6)  Wrist flexion               Wrist extension 10(50) 19(50) 28(50)  30(50) 35(55) 36(55) 36(60) 40(60) 45(60) 45(60) 42(54) 36(54)  Wrist ulnar deviation               Wrist radial deviation               Wrist pronation            60(74) 84(84)  Wrist supination   Limited by flexor tone       10(15)  30(38) 20(38)  (Blank rows = not tested)    Active ROM Right eval Right 03/21/2022 Right 05/25/2022 R  07/20/22 Right  07/25/22 Right 08/15/2022 Right 09/26/2022 Right 11/16/2022 Right 12/28/2022 Right 03/13/23 Right 05/10/23 Right 10/23/23 Right  9/24 /25  Shoulder flexion 106 scaption 105  Scaption 62 flexion 110 scaption 100  105 105 105 112 Scaption 123 Scaption 122  Scaption 142 Scaption 142  Shoulder abduction 114 90 97 100  105 117 120 124 124 122 144 146  Shoulder adduction               Shoulder extension               Shoulder internal rotation               Shoulder external rotation               Elbow flexion 120(130) 145 145 140   144 140 142 148 148 145  145  Elbow extension -45(-35) -22(-35) -28(-22)  -32(-21) -32(-28) -28(-26) -28(-28) -  27(-26) -27(-40) -38(-35) -34(-26) -34(-26)  Wrist flexion               Wrist extension -30(10) -25(10) -10(30)  -10(20) -10(25) -20(40) -30(10) -22(34) -22(35) -32(22) -30(-8) -38(-6)  Wrist ulnar deviation               Wrist radial deviation               Wrist pronation               Wrist supination   Limited by flexor tone                 12/28/2022: Thumb radial abduction: Right: 12(46), Left: 40(54)  Digits: Bilateral 2nd through 5th digit: MP Hyperextension with PIP, an DIP flexion    10/23/23:  Bilateral 2nd through 5th digit MP hyperextension with PIP, and DIP flexion: Pt. has been intermittently able to initate active diigt PIP, and DIP extension following Botox during this past recertification/progress reporting period.     UPPER EXTREMITY MMT:     Left eval Left 03/21/2022 Left  05/25/2022 L 07/20/22 Left  08/15/2022 Left 09/26/2022 Left 11/16/2022 Left 12/28/2022 Left  03/13/2023 Left  05/10/23 Left 10/23/23 Left 12/27/23  Shoulder flexion 3-/5 3/5 3+/5 4+/5 4+/5 4+/5 4+/5 4+/5 4+/5 4+/5 4+/5 4+/5  Shoulder abduction 3-/5 3-/5 4-/5 4+/5 4+/5 4+5 4+/5 4+/5 4+/5 4+/5 4+/5 4+/5  Shoulder adduction              Shoulder extension              Shoulder internal rotation              Shoulder external rotation              Elbow flexion 3/5 3+/5 4-/5 4+/5 5/5 5/5 5/5 5/5 5/5 5/5 5/5 5/5  Elbow extension 2-/5 2/5  4+/5 4+/5 4+/5  4/5 4/5 4/5 through available range 4+/5 through available range 4+/5  Wrist flexion              Wrist extension 2/5 2+/5 3-/5  3-/5 3-/5 3-/5 3/5 3/5 3/5 3+/5 3-/5  Wrist ulnar deviation              Wrist radial deviation              Wrist pronation              Wrist supination              (Blank rows = not tested)  Right eval Right 03/21/2022 Right  05/25/2022 R 07/20/22 Right 08/15/2022 Right 09/26/2022 Right 11/16/2022  Right 12/28/2022 Right 03/13/2023 Right 05/10/23 Right 10/23/23 Right 12/27/23  Shoulder flexion 3-/5 scaption 3-/5 3-/5 3+/5 3+/5 3+/5 3/5 3/5 3/5 3+/5 4-/5 4/5  Shoulder abduction 3-/5 3-/5 3-/5 4/5 4/5 4/5 4/5 4/5 4/5 4/-5 4-/5 4/5  Shoulder adduction              Shoulder extension              Shoulder internal rotation              Shoulder external rotation              Elbow flexion 3/5 3/5 3+/5 4/5 4+/5 4+/5 4+/5 4+/5 4+/5 4+/5 4+/5 5/5  Elbow extension 2-/5 2/5 3-/5 4+/5 4+/5 4+/5 4+/5 4-/5 4-/5 4-/5 4-/5 through available range 3-/5  Wrist flexion  Wrist extension 2-/5 2-/5 2/5 2/5 2/5 2/5 2/5 2/5 2/5 2/5 2/5 2-/5  Wrist ulnar deviation              Wrist radial deviation              Wrist pronation              Wrist supination                HAND FUNCTION:  Grip strength: Right: 0 lbs; Left: 0 lbs and Lateral pinch: Right: 5 lbs, Left: 2 lbs  03/21/2022: Lateral pinch: Right: 3.5 lbs, Left: 2 lbs  07/20/22: Grip strength: Right: 3 lbs; Left: 4 lbs and Lateral pinch: Right: 5 lbs, Left: 3 lbs  08/15/2022:  Grip strength: Right: 0 lbs; Left: 5 lbs and Lateral pinch: Right: 2 lbs, Left: 4 lbs  09/26/2022  Grip strength: Right: 0 lbs; Left: 8 lbs and Lateral pinch: Right: 2 lbs, Left: 5 lbs  11/16/2022  Grip strength: Right: 0 lbs; Left: 8 lbs  9/25/20204  Grip strength: Right: 0 lbs; Left: 8 lbs  Grip strength: Right: 0 lbs; Left: 8 lbs   03/08/23:  Grip strength: Right: 0 lbs without PROM, 5 lbs after PROM, Left 8 lbs  05/10/23:  Grip strength: TBD  Bilateral digit PIP/DIP flexion contractures with MP hyperextension with attempts for AROM. Pt. is able to tolerate AROM to the bilateral digits at the initial evaluation however, has a history of pain in the digits.  COORDINATION: Eval: Pt. is unable to grasp 9-hole test pegs. Pt. is able to initiate grasping larger pegs, and is able to hold a pen in the left hand.  07/20/22: 2 min 36 seconds to  remove 9 pegs from 9 hole peg test - cues to locate pegs 2/2 low vision. Pt. is able to initiate grasping larger pegs on R hand and is able to hold a pen in the left hand.  08/15/2022:    Vision, and sensation limiting accuracy of 9 hole peg test results. Pt. Was able to grasp and remove vertical pegs intermittently with cues.    05/10/23: TBD  SENSATION: Light touch: Impaired   EDEMA:  N/A  MUSCLE TONE: BUE flexor Spasticity  COGNITION Overall cognitive status: Continue to assess in functional context  VISION:   Subjective report: Pt. was not wearing glasses at the time of the initial eval.  Baseline vision: Vision is very limited. Wears glasses all the time Visual history: History of impaired vision following CVA. Pt. Has received treatment through the [facility] low vision rehabilitation program.   VISION ASSESSMENT: Impaired To be further assessed in functional context  PERCEPTION: Impaired   PRAXIS: Impaired: motor planning  OBSERVATIONS:  Pt reports being on Tramadol   TODAY'S TREATMENT 01/17/24   Therapeutic Ex.:    -BUE PROM was performed with emphasis placed on slow prolonged gentle stretching of distal RUE for elbow extension, and forearm supination, bilateral hands including: wrist/digit flexion/extension including MCPs, PIPs, DIPs of all digits, and wrist flexion/extension, and forearm supination in conjunction with moist heat modality 2/2 flexor tone, and tightness.   Therapeutic Activities:  -Facilitated right hand function skills grasping 1 thick foam discs from a tabletop top surface with white for high contrast. Pt. worked on transferring the discs from the right hand to the left hand, then worked on reaching out to the left to discard them into a container.  PATIENT EDUCATION: Education details: ROM, BUE tone  Person educated: Patient  and Parent Education method: Explanation Education comprehension: verbalized understanding  HOME EXERCISE PROGRAM:     Incorporating  the right hand during self-feeding tasks.   GOALS: Goals reviewed with patient? Yes  SHORT TERM GOALS: Target date: 12/05/2023   To assess splint fit, and make appropriate adjustments to promote good skin integrity through the palmar surface of the bilateral hands.  Baseline: 05/25/22: Goal currently met, however ongoing as needs to assess splint fit arise. 03/23/2022: Pt. is wearing splints a couple of hours at night bilateral resting hand splints. 03/21/2022: Pt. is wearing splints a couple of hours at night bilateral resting hand splints. Goal status: Deferred   LONG TERM GOALS: Target date: 03/20/2024   FOTO score will Improve by 2 points for Pt. perceived improvement with the assessment specific ADL/IADL tasks.  Baseline: 03/08/23: 51; 12/28/2022: FOTO score: 56; 11/16/2022: 52 09/27/2022: 51 07/20/2022: FOTO 55 05/25/2022: FOTO score: 50,  TR score: 45 Eval: FOTO score: 40, TR score: 45 Goal status: Achieved   2.   Pt. will independently perform oral care for 100% of the task after complete set-up. Baseline: 05/10/23: Pt. Performs 90% of the task. Pt.'s mother assists with 10% of the task for thorough cleaning the hard to reach places way in the back of the oral cavity.03/08/23: not as much assist required proximally, but to adjust positioning of toothbrush; 02/13/23: Pt. Continues to require assist proximally 01/09/2023: Continue 12/28/2022:  Pt. Continues to complete 90% of the task with complete set-up, with visual cues, and occassional assist of the right elbow. 11/16/2022: Pt. Completes 90% of the task with complete set-up. 09/26/2022: Completes 90% of the task, proximal assist at times required at the elbow.  08/15/2022: Completes 90% of the task, proximal assist at times required at the elbow. 07/20/22: completes 90% of task, limited by shoulder flexion. 05/25/2022:  Pt. Is able to initiate and perform oral care for approximately 90% of the task. Complete set-up required. Assi  needed only for the very back teeth. 03/23/2022: Pt. Is able to initiate and perform oral care for approximately 75% of the task. Pt. Requires assist at proximally at the elbow for through oral care. 03/21/2022: Pt. Is able to initiate and perform oral care for approximately 75% of the task. Pt. Requires assist at proximally at the elbow for through oral care. Eval: Pt. is able to initiate using an electric toothbrush. Pt. requires assist for set-up, and assist for thoroughness, and as he Pt. fatigues. Goal status:  Partially met, Goal discontinued  3.  Pt. Will be modified independence with self-feeding for 100% using a swivel spoon, and standard fork after complete set-up Baseline: 05/10/2023: Pt. feeds self 95-100% of the meal. with occasional assist to reposition the utensil. 03/08/23: indep with cup with a handle and lid, assist to reposition eating utensils in hand;  02/13/23:  Pt. Is now able to consistently, and efficiently use a swivel spoon for eating cereal with the left hand.01/09/2023: Continue 12/28/2022: Pt. Is now feeding himself 90% of the time using a swivel spoon with the left hand, and 95% of the time with the fork. Pt. Continues to have difficulty with cutting food. 11/16/2022: 100% with finger foods, Pt. Is initiating with a swivel spoon, and universal cuff for fork use, however requires assist for consistency, and accuracy.  09/26/2022: 90% using the spoon, assist at time with the forearm motion, and grip on the utensils. 100% for finger foods.  08/15/2022:  90% using the spoon, assit at time with the  forearm motion, and grip on the utensils. 100% for finger foods-independent with set-up- unsupervised.  07/20/22: self-feeds cereal using spoon 90% of task. 05/25/2022: Pt. Is able to use a spoon to scoop cereal when feeding himself cereal 85% of the time. Pt. Is able to feed himself snack/finger foods 100% of the time. Pt. Continues to work on consistency of  stabilizing a cup/mug when drinking. Pt.  Is able to grasp a water  bottle with assist initially, with assist tapering off as he drinks.03/23/2022: Pt. Is able to perform scooping cereal for 75% of the time. Pt. required assist, and support at the left elbow, and Pt. Presents with limited forearm supination when using the spoon, and bringing it towards his mouth. Pt. Is able to use a fork to spear items, and perform the hand to mouth pattern.  03/21/2022: Pt. Is able to perform scooping cereal for 75% of the time. Pt. required assist, and support at the left elbow, and Pt. presents with limited forearm supination when using the spoon, and bringing it towards his mouth. Pt. Is able to use a fork to spear items, and perform the hand to mouth pattern.  Eval: Pt. is able to hold standard standard utensils. Pt. Performs as much of the task as he, can and has assistance for the remainder. Goal status:  Improved, Achieved  4.  Pt. will improve grasp patterns and consistently grasp 1/4 objects for ADL, and IADL tasks.  Baseline: 01/08/24: Continue, carry goal over from recertification update last session. 12/27/23: Pt. is able to present with increased tightness  2/2 being way overdue for Botox injections, which limits consistency with grasping small objects. 10/23/2023: Pt. is able to initiate grasping small objects visual cues, and increased time, however continues to work towards grasping 1/4 objects efficiently. 08/27/23: Pt. is able to grasp flat discs, and sustain a secure hold on them while moving the UE through various planes. 08/07/23: Continue 05/31/2023: Continue 05/10/23: Pt. is consistently able to grasp 1/2 objects with visual cues. Pt. Requires assist, and increased visual cues to grasp 1/4 objects on an elevated plane.  Pt. Is unable to grasp the flat objects at the tabletop surface. Pt. Is grasping grasping small puppy vitamins.03/08/23: 1/2 objects efficiently; 02/13/2023: 1/2 objects efficiently 01/09/2023: Continue 12/28/2022: Continues to  grasp 1/4 pegs with min a + visual cues, consistently grasping 1/2 objects with visual cues 11/16/2022: Continues to grasp 1/4 pegs with min a + visual cues, consistently grasping 1/2 objects with visual cues 09/26/2022: Continues to grasp 1/4 pegs with min a + visual cues, consistently grasping 1/2 objects with visual cues. 08/15/2022: grasps 1/4 pegs with min a + visual cues, consistently grasping 1/2 objects with visual cues. 07/20/22: grasps 1/4 pegs with min a + visual cues, consistently grasping 1/2 objects with visual cues. 05/25/2022: Pt. Is working on improving consistency of grasping 1/2 objects with visual cues.  03/23/2022: Pt. Is able to grasp 1 objects consistently,and continues to work on the hand patterns needed to grasp 1/2 objects.03/21/2022: Pt. Is able to grasp 1 objects consistently,and continues to work on the hand patterns needed to grasp 1/2 objects. Eval: Pt. is able to grasp 1 objects intermittently using a lateral grasp pattern. Goal status: Ongoing  5.  Pt. will independently write his name legibly with letter sizes under 1. Baseline: 05/10/23: Deferred 03/08/23: Not addressed this period; 02/13/2023: Continue 01/09/2023: Continue. 12/28/2022: Pt. is now using an IPad Pen. 11/16/2022: Pt. Continues to be able to write name with smaller letter  size. Pt. Is signing name with a computer styus. Pt. Requires visual cues, and assist 09/26/2022: Pt. Continues to be able to write name with smaller letter size. Pt. Is signing name with a computer styus. Pt. Requires visual cues, and assist. 08/15/2022: Pt. Is able to write name with smaller letter size. Pt. Is signing name with a computer styus. Pt. Requires visual cues, and assist. 07/20/22: stabilizing assist to write name with 75% legibility with 2 letters. 05/25/2022: Pt. to continue to work towards formulating grasp patterns in preparation for grasping a large width pen. Pt. Requires visual cues. 03/23/2022: Pt. Is able to write his  name with modA, however has difficulty with formulating letter sizes less than 2 in size with 50% legibility for the 3 letters of his name.03/21/2022: Pt. Is able to write his name with modA, however has difficulty with formulating letter sizes less than 2 in size with 50% legibility for the 3 letters of his name. Eval: Pt is able to hold a thin marker with his left hand, and formulate a line, and initiate a circular pattern (Pt. without glasses today) Goal status: Deferred  6. Pt. Will reach up to comb/brush his hair with minA.  Baseline: 05/10/23: Pt. is able to use a pick independently with the left hand with occasional assist to the far side of the left his head. 03/08/23: Pt uses pick in L hand to reach 75% of his head; unable to reach R side of head; 02/13/2023:Pt. is able to use a hair pick with the left hand on the right side of his head, top, and back of the head. Pt. Continues to have difficulty reaching further to the right side of his head with the left. 01/09/2023: Continue 12/28/2022: Pt. Is progressing, and is now able to use a hair pick with the left hand on the right side of his head, top, and back of the head. Pt. Continues to have difficulty reaching further to the right side of his head with the left. 11/16/2022: Pt. Is able to use a hair pick for the left side of his head using his left hand. Pt. Presents with difficulty reaching to the right side of his head.09/26/2022: Pt. Is able to use a hair pick for the left side of his head using his left hand. Pt. Presents with difficulty reaching to the right side of his head. 5/13/202: 75% using a hair pick, assist to the right side of the head. 07/20/22: reaches 75% of head, assist for far R side of head, fatigues quickly. 05/25/2022: Pt. Is able to reach up with the left hand to the left side, top, and back of his head. 03/23/2022: Pt. is now able to more consistently initiate reaching up to his head with his left hand in preparation for  haircare12/18/2023: Pt. is now able to more consistently initiate reaching up to his head with his left hand in preparation for haircare. Eval: Pt. is able to initiate reaching up for hair care with a long handled brush, however is unable to sustain UE's in elevation to perform the task.     Goal status: Achieved  7. Pt. Will independently navigate the w/c through his environment with minA with visual scanning, and hand placement on the controls.  Baseline: 11/16/2022: Deferred 2/2 vision 09/26/2022: Pt. Is now navigating his w/c ouside the home, and around his block. 08/15/2022: Pt. Requires minA to setup hand on controls and MIN cues to navigate the w/c in wide spaces, requires MIN - MOD  cues to navigate the w/c through more narrow doorways, and tighter turns - varies based on good vs bad vision days.07/20/22: Pt. Requires minA to setup hand on controls and MIN cues to navigate the w/c in wide spaces, requires MIN - MOD cues to navigate the w/c through more narrow doorways, and tighter turns - varies based on good vs bad vision days. 05/25/2022: Pt. Requires minA  and  cues to navigate the w/c in wide spaces, and requires Mod cues to navigate the w/c through more narrow doorways, and tighter turns. Pt. Requires max cues for scanning through the environment, and moderate cues for hand placement on the controls.     Goal status: Deferred 2/2 vision    8. Pt. Will improve bilateral grip strength to be able to independently grasp, and pull up blankets, and linens.   Baseline: 05/10/23: Deferred 03/08/23: R grip 0-5 lbs, L grip 8 lbs; pt reports he can consistently pull up blankets and linens independently.  02/13/2023: Continue. Pt. presents with digit PIP, and DIP flexor tightness. 01/09/2023: Continue 12/28/2023: R: 0#, L: 5# Pt. Is now able to more consistently grasp and pull blankets up over him. 11/16/2022: R: 0 L: 8# Pt. Is more consistently grasping his blankets and attempting to pull them up. 09/26/2022: R: 0   L: 8# Pt. is attempting to grasp, and pull blankets up more, using mostly the left hand. 08/15/2022: Pt. has difficulty securely holding, and pulling up blankets, and linens.    Goal status:Deferred  9. Pt. will consistently actively control the releasing of blankets, covers, and linens from his hands once they are in the desired position over him. Baseline: 12/28/2022: Pt. Is consistently actively able to release linens once they are in the desired position over him. 11/16/2022: Pt. continues to improve with actively releasing blankets form his hands. 09/26/2022: Pt. is improving with actively releasing blankets form his hands. 08/15/2022: Pt. has difficulty with controlled releasing of blankets/linens from his grasp.     Goal status: Achieved    10. Pt. Will improve bilateral UE strength by 2 MM grades to assist with ADLs, and IADLs  Baseline: 01/08/24: Continue, carry goal over from recertification update last session. 12/27/23:  Right: shoulder flexion: 4/5, abduction: 4/5, elbow flexion: 5/5 , extension: 3-/5 wrist extension: 2-/5; Left: shoulder flexion: 4+/5, abduction: 4+/5, elbow flexion: 5/5 , extension: 4+/5  wrist extension: 3+/5  08/27/23:  Right: shoulder flexion: 3+/5, abduction: 4-/5, elbow flexion: 4+/5 , extension: 4-/5 wrist extension: 2/5; Left: shoulder flexion: 4+/5, abduction: 4+/5, elbow flexion: 5/5 , extension: 4+/5  wrist extension: 3-/5  10/23/2023: Right: shoulder flexion: 4-/5, abduction: 4-/5, elbow flexion: 4+/5 , extension: 4-/5 wrist extension: 2/5; Left: shoulder flexion: 4+/5, abduction: 4+/5, elbow flexion: 5/5 , extension: 4+/5  wrist extension: 3+/5  08/27/23:  Right: shoulder flexion: 3+/5, abduction: 4-/5, elbow flexion: 4+/5 , extension: 4-/5 wrist extension: 2/5; Left: shoulder flexion: 4+/5, abduction: 4+/5, elbow flexion: 5/5 , extension: 4/5  wrist extension: 3/5  08/07/23: Continue 05/31/2023: Continue 05/10/23: Right: shoulder flexion: 3+/5, abduction: 4-/5, elbow flexion:  4+/5 , extension: 4-/5 wrist extension: 2/5; Left: shoulder flexion: 4+/5, abduction: 4+/5, elbow flexion: 5/5 , extension: 4/5  wrist extension: 3/5 03/13/2023: Right: shoulder flexion: 3/5, abduction: 4/5, elbow flexion: 4+/5 , extension: 4-/5 wrist extension: 2/5; Left: shoulder flexion: 4+/5, abduction: 4+/5, elbow flexion: 5/5 , extension: 4/5  wrist extension: 3/5 03/08/23: NT this date d/t as pt arrived late; 02/13/2023: Continue 01/09/2023: Continue 12/28/2022: Right: shoulder flexion: 3/5, abduction:  4/5, elbow flexion: 4+/5 , extension: 4-/5 wrist extension: 2/5; Left: shoulder flexion: 4+/5, abduction: 4+/5, elbow flexion: 5/5 , extension: 4/5  wrist extension: 3/5 Right: shoulder flexion: 3+/5, abduction: 4/5, elbow flexion: 4+/5 , extension: 4+/5 wrist extension: 2/5; Left: shoulder flexion: 4+/5, abduction: 4+/5, elbow flexion: 5/5 , extension: 4+/5 wrist extension: 3-/5 11/16/2022: Right: shoulder flexion: 3/5, abduction: 4/5, elbow flexion: 4+/5 , extension: 4+/5 wrist extension: 2/5; Left: shoulder flexion: 4+/5, abduction: 4+/5, elbow flexion: 5/5 , extension:  wrist extension: 3-/5 Right: shoulder flexion: 3+/5, abduction: 4/5, elbow flexion: 4+/5 , extension: 4+/5 wrist extension: 2/5; Left: shoulder flexion: 4+/5, abduction: 4+/5, elbow flexion: 5/5 , extension: 4+/5 wrist extension: 3-/5 Goal status: Ongoing  11. Pt. will improve right wrist extension by 10 degrees to initiate reaching for items in preparation for ADLs. Baseline:01/08/24: Continue, carry goal over from recertification update last session.  12/27/23: Right wrist extension: -38(-6) 10/23/23: -30(-8) 08/23/2023: R Wrist Ext: -31(-8)  08/07/23: Continue 05/31/23: Continue 05/10/23: Right wrist extension: -32(22) 03/13/23: -22(35) 03/08/23: NT this date as pt arrived late; 02/13/2023: Continue 01/09/2023: Continue 12/28/2022: -22(34) 11/16/2022: -30(10)    Goal status: Ongoing      12. Pt. Will require donn/doff a Tank top T-shirt  efficiently with supervision and full set-up.    Baseline: 01/08/24: Continue, carry goal over from recertification update last session. 12/27/23: Independent doffing, MaxA doffing 10/23/23: Independent doff the shirt/tank, MaxA  donning. 10/23/23: Pt. Is able to independently doff his shirt, requiring assist to lean forward and loosen the back. 08/07/23: Continue 05/31/2023: Pt. Requires maxA donning a pullover sweatshirt, Mod-maxA doffing it. T-shirt tank TBD 05/10/23: Pt. Presents with difficulty completing, and requires increased time to complete    Goal status: Ongoing    13. Pt. Will independently perform bilateral/bimanual hand tasks needed to fold towels/linens/laundry Baseline: 01/08/24: Continue, carry goal over from recertification update last session. 12/27/23: Pt. Has not been able to work on the this  2/2 multiple cancellations this recertification 2/2 illness, familiy health issues,a nd transportation. 10/23/23: Pt. Continues to have difficulty folding laundry, and linens. 08/23/23: Pt. Continues to have difficulty using bilateral/bimanual hand tasks needed to fold towels/linens/laundry. 08/07/23: Continue 05/31/23: Continue 05/10/23:  Pt. Has difficulty folding towels/lines/laundry using bilateral hands Goal status:Ongoing  14. Pt. Will independently, and efficiently reach his right arm over the side of the chair to pet his dog.    Baseline: 08/23/2023: Pt. Is now able to reach over the side of the armrest of his recliner chair to pet his dog.  08/07/23: Continue 05/31/2023: Continue 05/10/23: Pt. is unable to efficiently reach his right UE over the side of his recliner chair to pet his dog.    Goal status: Achieved    15. Pt. Will independently, and efficiently reach out in front of him to efficiently place items onto his table while sitting in his recliner.     Baseline: 01/08/24: Continue, carry goal over from recertification update last session. 12/27/2023: Pt. is able to reach out to efficiently  reach out in front of him to place items on the table 90% of the time with the LUE, 10% of the time with the RUE 2/2  increased flexor tone. 10/23/23: Pt. Is improving with functional reaching with the left UE out in front of him to place items onto the table. Pt. has difficulty reaching out with the right hand to  place items on the table. 08/23/2023: Pt. has difficulty consistently reaching out in front of him to place items  on the on a tabletop surface with the right hand. 08/07/23: Continue 2/26/225: Continue 05/10/23: Pt. has difficulty reaching out in front of him far enough to efficiently place items on the table while sitting in a recliner chair.    Goal status: Continue/Ongoing     16. Pt. Will improve bilateral hand supination by 10 degrees to be able actively turn items in his hand to look at them independently and efficiently during ADLs/IADLs,    Baseline: 01/08/24: Continue, carry goal over from recertification update last session. 12/27/23: Right: Unable 2/2 increased tightness  Left: supination 20(38) 10/23/23: Right: unable, L: supination 30(38)    Goal status: Continue/Ongoing      17. Pt. Will independently, and efficiently transition between picking up finger foods for dipping, and  picking up utensils , then placing them down on the tabletop surface through 75% of the meal with minimal cues.    Baseline: 01/08/24: Continue, carry goal over from recertification update last session. 12/27/23: Pt. is able to consistently transition between picking up finger foods for dipping, and utensils consistently with the left hand with 100% accuracy time, and the right hand with 5% accuracy. 10/23/23: Pt. requires heavy cues, and assist to grasp finger foods, and utensils, and transition between the two.    Goal status: Continue/Ongoing   ASSESSMENT:  CLINICAL IMPRESSION:  Pt. continues to presents with increased tightness with right forearm supination, as well as bilateral wrist, and digit extension  2/2 to increased flexor tone, and tightness. Pt. presents with intermittent discomfort in the digits 2/2 the tightness. Pt. continues to be way overdue for Botox injections which is not scheduled for Botox until early November with is contributing to the tightness. Pt. requires proximal support with the RUE, visual cues, as well as a high contrast surface. Pt. continues to be very motivated to work towards regaining function in the bilateral hands. Pt.'s caregivers continue to be very supportive to continue working towards improving BUE functioning, ROM, strength, motor control, [facility] skills in order to maximize independence with daily ADLs, and IADL functioning. Plan to continue with the same goals into the next recertification period.   PERFORMANCE DEFICITS in functional skills including ADLs, IADLs, coordination, dexterity, proprioception, ROM, strength, pain, FMC, GMC, decreased knowledge of use of DME, and UE functional use, cognitive skills including safety awareness, and psychosocial skills including coping strategies, environmental adaptation, habits, and routines and behaviors.   IMPAIRMENTS are limiting patient from ADLs, IADLs, leisure, and social participation.   COMORBIDITIES may have co-morbidities  that affects occupational performance. Patient will benefit from skilled OT to address above impairments and improve overall function.  MODIFICATION OR ASSISTANCE TO COMPLETE EVALUATION: Maximum or significant modification of tasks or assist is necessary to complete an evaluation.  OT OCCUPATIONAL PROFILE AND HISTORY: Comprehensive assessment: Review of records and extensive additional review of physical, cognitive, psychosocial history related to current functional performance.  CLINICAL DECISION MAKING: High - multiple treatment options, significant modification of task necessary  REHAB POTENTIAL: Fair    EVALUATION COMPLEXITY: High    PLAN: OT FREQUENCY: 2 x's a week  OT DURATION:12  weeks  PLANNED INTERVENTIONS: self care/ADL training, therapeutic exercise, therapeutic activity, neuromuscular re-education, manual therapy, passive range of motion, patient/family education, and cognitive remediation/compensation RECOMMENDED OTHER SERVICES: PT  CONSULTED AND AGREED WITH PLAN OF CARE: Patient and family member/caregiver  PLAN FOR NEXT SESSION: Review HEP   Richardson Otter, MS, OTR/L  01/17/2024

## 2024-01-18 NOTE — Therapy (Signed)
 OUTPATIENT PHYSICAL THERAPY NEURO TREATMENT/Physical Therapy Progress Note   Dates of reporting period  10/30/2023   to   01/18/2024    Patient Name: Paul Hall MRN: [id number] DOB:02/07/1996, 28 y.o., male Today's Date: 01/18/2024   PCP:  Morene Sous, PA-C  REFERRING PROVIDER: Morene Sous, PA-C  END OF SESSION:  PT End of Session - 01/17/24 2118     Visit Number 180    Number of Visits 200   date corrected from most recent recert   Date for Recertification  03/18/24    Authorization Type [employer]/ [place] Medicaid    Authorization Time Period 01/04/21-03/29/21; Recert 03/24/2021-06/16/2021; Recert 09/15/2021- 12/08/2021; Recert 12/13/2021-03/07/2022    Progress Note Due on Visit 180    PT Start Time 1616    PT Stop Time 1700    PT Time Calculation (min) 44 min    Equipment Utilized During Treatment Gait belt    Activity Tolerance Patient tolerated treatment well    Behavior During Therapy WFL for tasks assessed/performed            Past Medical History:  Diagnosis Date   Diabetes mellitus (HCC)    Hypertension    Stroke [facility])    Past Surgical History:  Procedure Laterality Date   IVC FILTER PLACEMENT ([facility] HX)     LEG SURGERY     PEG TUBE PLACEMENT     TRACHEOSTOMY     Patient Active Problem List   Diagnosis Date Noted   Sepsis due to vancomycin  resistant Enterococcus species (HCC) 06/06/2019   SIRS (systemic inflammatory response syndrome) (HCC) 06/05/2019   Acute lower UTI 06/05/2019   VRE (vancomycin -resistant Enterococci) infection 06/05/2019   Anemia 06/05/2019   Skin ulcer of sacrum with necrosis of muscle (HCC)    Urinary retention    Type 2 diabetes mellitus without complication, with long-term current use of insulin  (HCC)    Tachycardia    Lower extremity edema    Acute metabolic encephalopathy    Obstructive sleep apnea    Morbid obesity with BMI of 60.0-69.9, adult (HCC)    Goals of care, counseling/discussion    Palliative care  encounter    Sepsis (HCC) 04/27/2019   H/O insulin  dependent diabetes mellitus 04/27/2019   History of CVA with residual deficit 04/27/2019   Seizure disorder (HCC) 04/27/2019   Decubitus ulcer of sacral region, stage 4 (HCC) 04/27/2019    ONSET DATE: October 2020  REFERRING DIAG: Cerebral infarction, unspecified   THERAPY DIAG:  Muscle weakness (generalized)  Other lack of coordination  Difficulty in walking, not elsewhere classified  History of CVA with residual deficit  Unsteadiness on feet  Other abnormalities of gait and mobility  Low vision, both eyes  Abnormality of gait and mobility  Cognitive communication deficit  Rationale for Evaluation and Treatment: Rehabilitation  SUBJECTIVE:  SUBJECTIVE STATEMENT: Patient reports still not feeling great and declined to attempt transfer to mat today requesting to stay in chair and work on strength.   Pt accompanied by: family member  PERTINENT HISTORY: Paul Hall is a 28 y/o male who presents with severe weakness, quadriparesis, altered sensorium, and visual impairment s/p critical illness and prolonged hospitalization. Pt hospitalized in October 2020 with ARDS 2/2 COVID19 infection. Pt sustained a complex and lengthy hospitalization which included tracheostomy, prolonged sedation, ECMO. In this period pt sustained CVA and SDH. Pt has now been liberated from tracheostomy and G-tube. Pt has since been hospitalized for wound infection and UTI. Pt lives with parents at home, has hospital bed and left chair, hoyer lift transfers, and power WC for mobility needs. Pt needs heavy physical assistance with ADL 2/2 BUE contractures and motor dysfunction   PAIN:  Are you having pain? No  PRECAUTIONS: Fall  RED FLAGS: None   WEIGHT BEARING  RESTRICTIONS: No  FALLS: Has patient fallen in last 6 months? No  LIVING ENVIRONMENT: Lives with: lives with their family Lives in: House/apartment Stairs:  Has following equipment at home: Wheelchair (power) and hospital bed, hoyer lift, Sit to stand lift  PLOF: Independent  PATIENT GOALS: I want to walk  OBJECTIVE:  Note: Objective measures were completed at Evaluation unless otherwise noted.  DIAGNOSTIC FINDINGS: CLINICAL DATA:  Altered mental status.  History of COVID and stroke.   EXAM: CT HEAD WITHOUT CONTRAST   TECHNIQUE: Contiguous axial images were obtained from the base of the skull through the vertex without intravenous contrast.   COMPARISON:  CT head 11/11/2012   FINDINGS: Brain: Left cerebral convexity cortical hypodensity with associated cortical calcification extending from the frontal to the parietal lobe. Extensive area of volume loss and low-density compatible with chronic infarction. This is noted on prior reports from [facility]. Probable chronic watershed infarct. Additional small areas of hypodensity in the right frontal white matter consistent with chronic ischemia.   Negative for acute infarct. No acute hemorrhage or mass. Ventricle size normal without midline shift.   Vascular: Normal arterial flow voids.   Skull: Negative   Sinuses/Orbits: Mucosal edema paranasal sinuses with air-fluid level left sphenoid sinus. Negative orbit   Other: None   IMPRESSION: Extensive watershed type infarct over the left cerebral convexity with cortical calcification. This appears chronic. No associated hemorrhage, hydrocephalus, or midline shift.   These results were called by telephone at the time of interpretation on 05/09/2019 at 3:12 pm to provider [name] , who verbally acknowledged these results.     Electronically Signed   By: [name] M.D.   On: 05/09/2019 15:13  COGNITION: Overall cognitive status: Impaired   SENSATION: Light  touch: WFL  COORDINATION: Delayed with BLE  EDEMA:  None observed  MUSCLE TONE: RLE: Moderate  POSTURE: rounded shoulders and forward head  LOWER EXTREMITY ROM:     Active  Right Eval Left Eval  Hip flexion    Hip extension    Hip abduction    Hip adduction    Hip internal rotation    Hip external rotation    Knee flexion    Knee extension    Ankle dorsiflexion    Ankle plantarflexion    Ankle inversion    Ankle eversion     (Blank rows = not tested)  LOWER EXTREMITY MMT:    MMT Right Eval Left Eval  Hip flexion    Hip extension    Hip abduction  Hip adduction    Hip internal rotation    Hip external rotation    Knee flexion    Knee extension    Ankle dorsiflexion    Ankle plantarflexion    Ankle inversion    Ankle eversion    (Blank rows = not tested)  BED MOBILITY:  Findings: Rolling to Right Max A Rolling to Left Max A  TRANSFERS: Sit to stand: Max A x 2 - just able to clear gluteals off mat  Assistive device utilized: HHA x2 to stand- dependent on hoyer for actual transfers     Stand to sit: Max A  Assistive device utilized: HHA x 2        GAIT: Findings: Non-Ambulatory  FUNCTIONAL TESTS:  None appropriate at this time  PATIENT SURVEYS:                                                                                                                                TREATMENT DATE: 01/18/2024   TE:   Ankle pumps LLE 2 x 10 (AROM)  Knee ext each LE 2 x 10  Knee flex (manual resist) 2 x 10 each LE Hip add (manual resist) 2 x 10 (5 sec hold) each LE Hip Abd active 2 x 10 each LE AROM-straight leg hip IR 2 x 10 reps each LE AAROM Seated Hip flex 2 x 10 reps each LE      PATIENT EDUCATION: Education details: Exercise technique; Plan for recert today Person educated: Patient and Parent Education method: Explanation Education comprehension: verbalized understanding  HOME EXERCISE PROGRAM: Access Code: [email or web address] URL:  [email or web address] Date: 03/23/2022 Prepared by: Reyes London   Exercises - Supine Bridge  - 3 x weekly - 3 sets - 10 reps - 2 hold - Supine Gluteal Sets  - 3 x weekly - 3 sets - 10 reps - 5 sec hold - Supine Quad Set  - 1 x daily - 3 x weekly - 3 sets - 10 reps - 5 hold - Seated Long Arc Quad  - 1 x daily - 7 x weekly - 3 sets - 10 reps - Seated Hip Adduction Squeeze with Ball  - 1 x daily - 3 x weekly - 3 sets - 10 reps - 5 hold - Seated Hip Abduction  - 1 x daily - 3 x weekly - 3 sets - 10 reps - 2 hold  GOALS: Goals reviewed with patient? Yes  SHORT TERM GOALS: Target date: 02/05/2024  Patient will be independent with updated HEP for improved overall LE strength in home Baseline: HEP continues to be updated as appropriate.  Goal status: NEW  LONG TERM GOALS: Target date: 03/18/2024 -Goal were updated/revised secondary to length of chart.   Patient will perform sit to stand transfer with appropriate AD and max assist of 2 people with 75% consistency to prepare for pregait activities.  Baseline: 09/04/2023- Patient performed several Sit to stand requiring max  A+2  with improving posture- able to ext R knee better - near terminal knee and able to clear bottom off mat- still not full but able to contract glutes for standing as erect as possible.  Goal status: ONGOING  2.  Patient will tolerate 2 minutes or more of standing in sit to stand lift or with max assist +2 in order to indicate improved lower extremity weightbearing tolerance for progression to standing in parallel bars  Baseline:  09/04/2023- Patient performed 3 trials of standing ranging from 17 -47 sec - improving right knee terminal knee ext and more activation upon command with gluteals- able to clear mat table. Continued to require Max A+2 and blocking knees with forearm/trunk support.   Goal status: ONGOING  3.  Patient will perform rolling left to right with Mod assist for improved independence with  bed mobility to assist with dressing and positioning in bed.  Baseline: Dependent on caregiver to roll to left; 12/27/2023= Mod to max assist each direction (difficult due to RUE tone)  Goal status: Ongoing  4.  Patient will perform active bridging -able to clear bottom   Off mat for 10 reps with PT/caregiver holding his feet for improved trunk ext strength with standing and to assist with mobility/positioning.  Baseline: Unable to perform 10 reps with consistent clearing of bottom; 12/27/2023 = Patient able to minimally clear his glues off mat table - but successfully 10 reps today.  Goal status: MET  5. Patient will perform active bridging -able to clear bottom   Off mat for 3 sets of 10 reps with PT/caregiver holding his feet for improved trunk ext strength with standing and to assist with mobility/positioning.  Baseline: 12/27/2023- Patient able to complete 10 reps with max effort  Goal status: New     ASSESSMENT:  CLINICAL IMPRESSION: Patient arrived in good spirits but reported not feeling great- unable to assess goals for progress visit due to patient requesting to stay in chair today. He did perform well overall- able to incorporate some active and resistive exercises well with less physical assistance.  Will attempt to reassess goals as able next visit. Pt will continue to benefit from skilled physical therapy intervention to address impairments, improve QOL, and attain therapy goals.   OBJECTIVE IMPAIRMENTS: Abnormal gait, cardiopulmonary status limiting activity, decreased activity tolerance, decreased balance, decreased cognition, decreased coordination, decreased endurance, decreased knowledge of condition, decreased knowledge of use of DME, decreased mobility, difficulty walking, decreased ROM, decreased strength, hypomobility, impaired flexibility, impaired sensation, impaired UE functional use, impaired vision/preception, postural dysfunction, obesity, and pain.   ACTIVITY  LIMITATIONS: carrying, lifting, bending, sitting, standing, squatting, stairs, transfers, bed mobility, bathing, toileting, dressing, self feeding, reach over head, and hygiene/grooming  PARTICIPATION LIMITATIONS: meal prep, cleaning, laundry, medication management, personal finances, interpersonal relationship, driving, shopping, community activity, occupation, yard work, and church  PERSONAL FACTORS: Time since onset of injury/illness/exacerbation and 3+ comorbidities: DM, CVA, Seizures are also affecting patient's functional outcome.   REHAB POTENTIAL: Good  CLINICAL DECISION MAKING: Evolving/moderate complexity  EVALUATION COMPLEXITY: Moderate  PLAN:  PT FREQUENCY: 1-2x/week  PT DURATION: 12 weeks  PLANNED INTERVENTIONS: 97110-Therapeutic exercises, 97530- Therapeutic activity, 97112- Neuromuscular re-education, 97535- Self Care, [id number]- Manual therapy, and Patient/Family education  PLAN FOR NEXT SESSION:   Resume standing as able  Attempt standing frame if appropriate Nustep for UE/LE strengthening Mat table dynamic sitting activities.    Reyes LOISE London, PT 01/18/2024, 9:25 PM

## 2024-01-22 ENCOUNTER — Ambulatory Visit: Admitting: Occupational Therapy

## 2024-01-22 ENCOUNTER — Ambulatory Visit

## 2024-01-22 DIAGNOSIS — R278 Other lack of coordination: Secondary | ICD-10-CM | POA: Diagnosis not present

## 2024-01-22 DIAGNOSIS — M6281 Muscle weakness (generalized): Secondary | ICD-10-CM | POA: Diagnosis not present

## 2024-01-22 DIAGNOSIS — R2681 Unsteadiness on feet: Secondary | ICD-10-CM

## 2024-01-22 DIAGNOSIS — R262 Difficulty in walking, not elsewhere classified: Secondary | ICD-10-CM

## 2024-01-22 DIAGNOSIS — R2689 Other abnormalities of gait and mobility: Secondary | ICD-10-CM

## 2024-01-22 DIAGNOSIS — I6930 Unspecified sequelae of cerebral infarction: Secondary | ICD-10-CM | POA: Diagnosis not present

## 2024-01-22 DIAGNOSIS — R269 Unspecified abnormalities of gait and mobility: Secondary | ICD-10-CM | POA: Diagnosis not present

## 2024-01-22 DIAGNOSIS — H543 Unqualified visual loss, both eyes: Secondary | ICD-10-CM | POA: Diagnosis not present

## 2024-01-22 DIAGNOSIS — R41841 Cognitive communication deficit: Secondary | ICD-10-CM | POA: Diagnosis not present

## 2024-01-23 NOTE — Therapy (Signed)
 . Occupational Therapy Neuro Treatment Note    Patient Name: Paul Hall MRN: [id number] DOB:May 25, 1995, 28 y.o., male Today's Date: 01/23/2024  PCP: Dr. Bertell REFERRING PROVIDER: Dr. Bertell   .   OT End of Session - 01/23/24 1037     Visit Number 113    Number of Visits 180    Date for Recertification  03/20/24    Authorization Type --    OT Start Time 1625    OT Stop Time 1710    OT Time Calculation (min) 45 min    Equipment Utilized During Treatment tilt in space power wc    Activity Tolerance Patient tolerated treatment well    Behavior During Therapy WFL for tasks assessed/performed                  Past Medical History:  Diagnosis Date   Diabetes mellitus (HCC)    Hypertension    Stroke [facility])    Past Surgical History:  Procedure Laterality Date   IVC FILTER PLACEMENT ([facility] HX)     LEG SURGERY     PEG TUBE PLACEMENT     TRACHEOSTOMY     Patient Active Problem List   Diagnosis Date Noted   Sepsis due to vancomycin  resistant Enterococcus species (HCC) 06/06/2019   SIRS (systemic inflammatory response syndrome) (HCC) 06/05/2019   Acute lower UTI 06/05/2019   VRE (vancomycin -resistant Enterococci) infection 06/05/2019   Anemia 06/05/2019   Skin ulcer of sacrum with necrosis of muscle (HCC)    Urinary retention    Type 2 diabetes mellitus without complication, with long-term current use of insulin  (HCC)    Tachycardia    Lower extremity edema    Acute metabolic encephalopathy    Obstructive sleep apnea    Morbid obesity with BMI of 60.0-69.9, adult (HCC)    Goals of care, counseling/discussion    Palliative care encounter    Sepsis (HCC) 04/27/2019   H/O insulin  dependent diabetes mellitus 04/27/2019   History of CVA with residual deficit 04/27/2019   Seizure disorder (HCC) 04/27/2019   Decubitus ulcer of sacral region, stage 4 (HCC) 04/27/2019   ONSET DATE: 01/2019  REFERRING DIAG: CVA/COVID-19  THERAPY DIAG:  Muscle weakness  (generalized)  Rationale for Evaluation and Treatment Rehabilitation  SUBJECTIVE:    SUBJECTIVE STATEMENT:  Pt. reports his UEs feel very tight, and uncomfortable after being confined in the standing frame in PT  Pt accompanied by: self and family member  PERTINENT HISTORY:  Pt. is a 28 y.o. male who was diagnosed with COVID-19, and CVA with resultant quadriplegia. Pt. Was then hospitalized with VRE UTI. PMHx includes: urinary retention, seizure disorder, obstructive sleep disorder, DM Type II, Morbid obesity.   PRECAUTIONS: None  WEIGHT BEARING RESTRICTIONS No  PAIN:  No reports of pain Are you having pain? 0/10  FALLS: Has patient fallen in last 6 months? No  LIVING ENVIRONMENT: Lives with: lives with their family Lives in: House/apartment Stairs: No Level Entry Has following equipment at home: Wheelchair (power) and hoyer lift, sit to stand lift  PLOF: Independent  PATIENT GOALS: To be able to engage in more daily care tasks.  OBJECTIVE:   HAND DOMINANCE: Right  ADLs:  Transfers/ambulation related to ADLs: Eating: Pt. reports being able to hold standard utensils, and is starting to engage more in self-feeding tasks, hand to mouth patterns. Pt. Reports that he does as much as he can with the task, and family assists  with the remainder of the task.  Grooming: Pt. is able to initiate holding an electric toothbrush, and brush his teeth. Family assists LB Dressing: Total Assist UE dressing: Pt. is now able to reach up to actively assist with grasping , and pulling his gown down. Toileting:  Total Assist Bathing: MaxA UB, Total assist LB Tub Shower transfers: N/A Equipment: See above    IADLs: Shopping: Relies on family to assist Light housekeeping: Total Assist Meal Prep: Total Assist Community mobility:   Medication management:  Total Assist  Financial management: N/A Handwriting: Not legible: Pt. Is able to hold a pen with the left hand, and initiate marking  the page. Pt.'s eye glasses were not available  MOBILITY STATUS:  Power w/c  POSTURE COMMENTS:  Pt. Requires position changes in his power w/c  ACTIVITY TOLERANCE: Activity tolerance:  Fair  FUNCTIONAL OUTCOME MEASURES: FOTO: 40 TR score: 45  05/25/2022:   FOTO: 50 TR score: 45  07/20/22: FOTO 55  03/08/23: 51  UPPER EXTREMITY ROM     Active ROM Left eval Left  03/21/2022 Left 05/25/2022 L  07/20/22 Left 07/25/2022 Left  08/15/2022 Left  09/26/2022 Left  11/16/2022 Left 12/28/2022 Left 03/13/23 Left 05/10/23 Left 10/23/23 Left 12/27/23  Shoulder flexion 119 123 125 130  136 138 130 142 144 144 148 148  Shoulder abduction 110 115 115 115  118 121 125 142 140 140 140 140  Shoulder adduction               Shoulder extension               Shoulder internal rotation               Shoulder external rotation               Elbow flexion 135(135) 145 145 145  145    145 145  145  Elbow extension -27(-18) -21(-20) -20(-16)  -25(-10) -23(-10) -21(-10) -20(-18) -20(-18) -20(-17) -17(-16) -12(-8) -12(-6)  Wrist flexion               Wrist extension 10(50) 19(50) 28(50)  30(50) 35(55) 36(55) 36(60) 40(60) 45(60) 45(60) 42(54) 36(54)  Wrist ulnar deviation               Wrist radial deviation               Wrist pronation            60(74) 84(84)  Wrist supination   Limited by flexor tone       10(15)  30(38) 20(38)  (Blank rows = not tested)    Active ROM Right eval Right 03/21/2022 Right 05/25/2022 R  07/20/22 Right  07/25/22 Right 08/15/2022 Right 09/26/2022 Right 11/16/2022 Right 12/28/2022 Right 03/13/23 Right 05/10/23 Right 10/23/23 Right  9/24 /25  Shoulder flexion 106 scaption 105  Scaption 62 flexion 110 scaption 100  105 105 105 112 Scaption 123 Scaption 122  Scaption 142 Scaption 142  Shoulder abduction 114 90 97 100  105 117 120 124 124 122 144 146  Shoulder adduction               Shoulder extension               Shoulder internal rotation               Shoulder  external rotation               Elbow flexion 120(130) 145 145 140  144 140 142 148 148  145  145  Elbow extension -45(-35) -22(-35) -28(-22)  -32(-21) -32(-28) -28(-26) -28(-28) -27(-26) -27(-40) -38(-35) -34(-26) -34(-26)  Wrist flexion               Wrist extension -30(10) -25(10) -10(30)  -10(20) -10(25) -20(40) -30(10) -22(34) -22(35) -32(22) -30(-8) -38(-6)  Wrist ulnar deviation               Wrist radial deviation               Wrist pronation               Wrist supination   Limited by flexor tone                 12/28/2022: Thumb radial abduction: Right: 12(46), Left: 40(54)  Digits: Bilateral 2nd through 5th digit: MP Hyperextension with PIP, an DIP flexion    10/23/23:  Bilateral 2nd through 5th digit MP hyperextension with PIP, and DIP flexion: Pt. has been intermittently able to initate active diigt PIP, and DIP extension following Botox during this past recertification/progress reporting period.     UPPER EXTREMITY MMT:     Left eval Left 03/21/2022 Left  05/25/2022 L 07/20/22 Left  08/15/2022 Left 09/26/2022 Left 11/16/2022 Left 12/28/2022 Left  03/13/2023 Left  05/10/23 Left 10/23/23 Left 12/27/23  Shoulder flexion 3-/5 3/5 3+/5 4+/5 4+/5 4+/5 4+/5 4+/5 4+/5 4+/5 4+/5 4+/5  Shoulder abduction 3-/5 3-/5 4-/5 4+/5 4+/5 4+5 4+/5 4+/5 4+/5 4+/5 4+/5 4+/5  Shoulder adduction              Shoulder extension              Shoulder internal rotation              Shoulder external rotation              Elbow flexion 3/5 3+/5 4-/5 4+/5 5/5 5/5 5/5 5/5 5/5 5/5 5/5 5/5  Elbow extension 2-/5 2/5  4+/5 4+/5 4+/5  4/5 4/5 4/5 through available range 4+/5 through available range 4+/5  Wrist flexion              Wrist extension 2/5 2+/5 3-/5  3-/5 3-/5 3-/5 3/5 3/5 3/5 3+/5 3-/5  Wrist ulnar deviation              Wrist radial deviation              Wrist pronation              Wrist supination              (Blank rows = not tested)  Right eval Right 03/21/2022 Right  05/25/2022  R 07/20/22 Right 08/15/2022 Right 09/26/2022 Right 11/16/2022 Right 12/28/2022 Right 03/13/2023 Right 05/10/23 Right 10/23/23 Right 12/27/23  Shoulder flexion 3-/5 scaption 3-/5 3-/5 3+/5 3+/5 3+/5 3/5 3/5 3/5 3+/5 4-/5 4/5  Shoulder abduction 3-/5 3-/5 3-/5 4/5 4/5 4/5 4/5 4/5 4/5 4/-5 4-/5 4/5  Shoulder adduction              Shoulder extension              Shoulder internal rotation              Shoulder external rotation              Elbow flexion 3/5 3/5 3+/5 4/5 4+/5 4+/5 4+/5 4+/5 4+/5 4+/5 4+/5 5/5  Elbow extension 2-/5 2/5 3-/5 4+/5 4+/5 4+/5 4+/5 4-/5 4-/5 4-/5 4-/5  through available range 3-/5  Wrist flexion              Wrist extension 2-/5 2-/5 2/5 2/5 2/5 2/5 2/5 2/5 2/5 2/5 2/5 2-/5  Wrist ulnar deviation              Wrist radial deviation              Wrist pronation              Wrist supination                HAND FUNCTION:  Grip strength: Right: 0 lbs; Left: 0 lbs and Lateral pinch: Right: 5 lbs, Left: 2 lbs  03/21/2022: Lateral pinch: Right: 3.5 lbs, Left: 2 lbs  07/20/22: Grip strength: Right: 3 lbs; Left: 4 lbs and Lateral pinch: Right: 5 lbs, Left: 3 lbs  08/15/2022:  Grip strength: Right: 0 lbs; Left: 5 lbs and Lateral pinch: Right: 2 lbs, Left: 4 lbs  09/26/2022  Grip strength: Right: 0 lbs; Left: 8 lbs and Lateral pinch: Right: 2 lbs, Left: 5 lbs  11/16/2022  Grip strength: Right: 0 lbs; Left: 8 lbs  9/25/20204  Grip strength: Right: 0 lbs; Left: 8 lbs  Grip strength: Right: 0 lbs; Left: 8 lbs   03/08/23:  Grip strength: Right: 0 lbs without PROM, 5 lbs after PROM, Left 8 lbs  05/10/23:  Grip strength: TBD  Bilateral digit PIP/DIP flexion contractures with MP hyperextension with attempts for AROM. Pt. is able to tolerate AROM to the bilateral digits at the initial evaluation however, has a history of pain in the digits.  COORDINATION: Eval: Pt. is unable to grasp 9-hole test pegs. Pt. is able to initiate grasping larger pegs, and is able to  hold a pen in the left hand.  07/20/22: 2 min 36 seconds to remove 9 pegs from 9 hole peg test - cues to locate pegs 2/2 low vision. Pt. is able to initiate grasping larger pegs on R hand and is able to hold a pen in the left hand.  08/15/2022:    Vision, and sensation limiting accuracy of 9 hole peg test results. Pt. Was able to grasp and remove vertical pegs intermittently with cues.    05/10/23: TBD  SENSATION: Light touch: Impaired   EDEMA:  N/A  MUSCLE TONE: BUE flexor Spasticity  COGNITION Overall cognitive status: Continue to assess in functional context  VISION:   Subjective report: Pt. was not wearing glasses at the time of the initial eval.  Baseline vision: Vision is very limited. Wears glasses all the time Visual history: History of impaired vision following CVA. Pt. Has received treatment through the [facility] low vision rehabilitation program.   VISION ASSESSMENT: Impaired To be further assessed in functional context  PERCEPTION: Impaired   PRAXIS: Impaired: motor planning  OBSERVATIONS:  Pt reports being on Tramadol   TODAY'S TREATMENT 01/22/24    Manual Therapy:  -Soft tissue massage to the volar, dorsal, lateral, and medical surfaces of the bilateral 2nd through 5th digit PIP, and DIPs  2/2 flexor tone, and stiffness. -Manual therapy was performed independent of, and in preparation for therapeutic Ex.     Therapeutic Ex.:    -BUE PROM was performed with emphasis placed on slow prolonged gentle stretching of distal RUE for elbow extension, and forearm supination, bilateral hands including: wrist/digit flexion/extension including MCPs, PIPs, DIPs of all digits, and wrist flexion/extension, and forearm supination in conjunction with moist heat modality 2/2 flexor tone,  and tightness.   PATIENT EDUCATION: Education details: ROM, BUE tone  Person educated: Patient and Parent Education method: Explanation Education comprehension: verbalized  understanding  HOME EXERCISE PROGRAM:    Incorporating  the right hand during self-feeding tasks.   GOALS: Goals reviewed with patient? Yes  SHORT TERM GOALS: Target date: 12/05/2023   To assess splint fit, and make appropriate adjustments to promote good skin integrity through the palmar surface of the bilateral hands.  Baseline: 05/25/22: Goal currently met, however ongoing as needs to assess splint fit arise. 03/23/2022: Pt. is wearing splints a couple of hours at night bilateral resting hand splints. 03/21/2022: Pt. is wearing splints a couple of hours at night bilateral resting hand splints. Goal status: Deferred   LONG TERM GOALS: Target date: 03/20/2024   FOTO score will Improve by 2 points for Pt. perceived improvement with the assessment specific ADL/IADL tasks.  Baseline: 03/08/23: 51; 12/28/2022: FOTO score: 56; 11/16/2022: 52 09/27/2022: 51 07/20/2022: FOTO 55 05/25/2022: FOTO score: 50,  TR score: 45 Eval: FOTO score: 40, TR score: 45 Goal status: Achieved   2.   Pt. will independently perform oral care for 100% of the task after complete set-up. Baseline: 05/10/23: Pt. Performs 90% of the task. Pt.'s mother assists with 10% of the task for thorough cleaning the hard to reach places way in the back of the oral cavity.03/08/23: not as much assist required proximally, but to adjust positioning of toothbrush; 02/13/23: Pt. Continues to require assist proximally 01/09/2023: Continue 12/28/2022:  Pt. Continues to complete 90% of the task with complete set-up, with visual cues, and occassional assist of the right elbow. 11/16/2022: Pt. Completes 90% of the task with complete set-up. 09/26/2022: Completes 90% of the task, proximal assist at times required at the elbow.  08/15/2022: Completes 90% of the task, proximal assist at times required at the elbow. 07/20/22: completes 90% of task, limited by shoulder flexion. 05/25/2022:  Pt. Is able to initiate and perform oral care for approximately 90% of the  task. Complete set-up required. Assi needed only for the very back teeth. 03/23/2022: Pt. Is able to initiate and perform oral care for approximately 75% of the task. Pt. Requires assist at proximally at the elbow for through oral care. 03/21/2022: Pt. Is able to initiate and perform oral care for approximately 75% of the task. Pt. Requires assist at proximally at the elbow for through oral care. Eval: Pt. is able to initiate using an electric toothbrush. Pt. requires assist for set-up, and assist for thoroughness, and as he Pt. fatigues. Goal status:  Partially met, Goal discontinued  3.  Pt. Will be modified independence with self-feeding for 100% using a swivel spoon, and standard fork after complete set-up Baseline: 05/10/2023: Pt. feeds self 95-100% of the meal. with occasional assist to reposition the utensil. 03/08/23: indep with cup with a handle and lid, assist to reposition eating utensils in hand;  02/13/23:  Pt. Is now able to consistently, and efficiently use a swivel spoon for eating cereal with the left hand.01/09/2023: Continue 12/28/2022: Pt. Is now feeding himself 90% of the time using a swivel spoon with the left hand, and 95% of the time with the fork. Pt. Continues to have difficulty with cutting food. 11/16/2022: 100% with finger foods, Pt. Is initiating with a swivel spoon, and universal cuff for fork use, however requires assist for consistency, and accuracy.  09/26/2022: 90% using the spoon, assist at time with the forearm motion, and grip on the utensils. 100%  for finger foods.  08/15/2022:  90% using the spoon, assit at time with the forearm motion, and grip on the utensils. 100% for finger foods-independent with set-up- unsupervised.  07/20/22: self-feeds cereal using spoon 90% of task. 05/25/2022: Pt. Is able to use a spoon to scoop cereal when feeding himself cereal 85% of the time. Pt. Is able to feed himself snack/finger foods 100% of the time. Pt. Continues to work on consistency of   stabilizing a cup/mug when drinking. Pt. Is able to grasp a water  bottle with assist initially, with assist tapering off as he drinks.03/23/2022: Pt. Is able to perform scooping cereal for 75% of the time. Pt. required assist, and support at the left elbow, and Pt. Presents with limited forearm supination when using the spoon, and bringing it towards his mouth. Pt. Is able to use a fork to spear items, and perform the hand to mouth pattern.  03/21/2022: Pt. Is able to perform scooping cereal for 75% of the time. Pt. required assist, and support at the left elbow, and Pt. presents with limited forearm supination when using the spoon, and bringing it towards his mouth. Pt. Is able to use a fork to spear items, and perform the hand to mouth pattern.  Eval: Pt. is able to hold standard standard utensils. Pt. Performs as much of the task as he, can and has assistance for the remainder. Goal status:  Improved, Achieved  4.  Pt. will improve grasp patterns and consistently grasp 1/4 objects for ADL, and IADL tasks.  Baseline: 01/08/24: Continue, carry goal over from recertification update last session. 12/27/23: Pt. is able to present with increased tightness  2/2 being way overdue for Botox injections, which limits consistency with grasping small objects. 10/23/2023: Pt. is able to initiate grasping small objects visual cues, and increased time, however continues to work towards grasping 1/4 objects efficiently. 08/27/23: Pt. is able to grasp flat discs, and sustain a secure hold on them while moving the UE through various planes. 08/07/23: Continue 05/31/2023: Continue 05/10/23: Pt. is consistently able to grasp 1/2 objects with visual cues. Pt. Requires assist, and increased visual cues to grasp 1/4 objects on an elevated plane.  Pt. Is unable to grasp the flat objects at the tabletop surface. Pt. Is grasping grasping small puppy vitamins.03/08/23: 1/2 objects efficiently; 02/13/2023: 1/2 objects efficiently  01/09/2023: Continue 12/28/2022: Continues to grasp 1/4 pegs with min a + visual cues, consistently grasping 1/2 objects with visual cues 11/16/2022: Continues to grasp 1/4 pegs with min a + visual cues, consistently grasping 1/2 objects with visual cues 09/26/2022: Continues to grasp 1/4 pegs with min a + visual cues, consistently grasping 1/2 objects with visual cues. 08/15/2022: grasps 1/4 pegs with min a + visual cues, consistently grasping 1/2 objects with visual cues. 07/20/22: grasps 1/4 pegs with min a + visual cues, consistently grasping 1/2 objects with visual cues. 05/25/2022: Pt. Is working on improving consistency of grasping 1/2 objects with visual cues.  03/23/2022: Pt. Is able to grasp 1 objects consistently,and continues to work on the hand patterns needed to grasp 1/2 objects.03/21/2022: Pt. Is able to grasp 1 objects consistently,and continues to work on the hand patterns needed to grasp 1/2 objects. Eval: Pt. is able to grasp 1 objects intermittently using a lateral grasp pattern. Goal status: Ongoing  5.  Pt. will independently write his name legibly with letter sizes under 1. Baseline: 05/10/23: Deferred 03/08/23: Not addressed this period; 02/13/2023: Continue 01/09/2023: Continue. 12/28/2022: Pt. is now using  an IPad Pen. 11/16/2022: Pt. Continues to be able to write name with smaller letter size. Pt. Is signing name with a computer styus. Pt. Requires visual cues, and assist 09/26/2022: Pt. Continues to be able to write name with smaller letter size. Pt. Is signing name with a computer styus. Pt. Requires visual cues, and assist. 08/15/2022: Pt. Is able to write name with smaller letter size. Pt. Is signing name with a computer styus. Pt. Requires visual cues, and assist. 07/20/22: stabilizing assist to write name with 75% legibility with 2 letters. 05/25/2022: Pt. to continue to work towards formulating grasp patterns in preparation for grasping a large width pen. Pt. Requires  visual cues. 03/23/2022: Pt. Is able to write his name with modA, however has difficulty with formulating letter sizes less than 2 in size with 50% legibility for the 3 letters of his name.03/21/2022: Pt. Is able to write his name with modA, however has difficulty with formulating letter sizes less than 2 in size with 50% legibility for the 3 letters of his name. Eval: Pt is able to hold a thin marker with his left hand, and formulate a line, and initiate a circular pattern (Pt. without glasses today) Goal status: Deferred  6. Pt. Will reach up to comb/brush his hair with minA.  Baseline: 05/10/23: Pt. is able to use a pick independently with the left hand with occasional assist to the far side of the left his head. 03/08/23: Pt uses pick in L hand to reach 75% of his head; unable to reach R side of head; 02/13/2023:Pt. is able to use a hair pick with the left hand on the right side of his head, top, and back of the head. Pt. Continues to have difficulty reaching further to the right side of his head with the left. 01/09/2023: Continue 12/28/2022: Pt. Is progressing, and is now able to use a hair pick with the left hand on the right side of his head, top, and back of the head. Pt. Continues to have difficulty reaching further to the right side of his head with the left. 11/16/2022: Pt. Is able to use a hair pick for the left side of his head using his left hand. Pt. Presents with difficulty reaching to the right side of his head.09/26/2022: Pt. Is able to use a hair pick for the left side of his head using his left hand. Pt. Presents with difficulty reaching to the right side of his head. 5/13/202: 75% using a hair pick, assist to the right side of the head. 07/20/22: reaches 75% of head, assist for far R side of head, fatigues quickly. 05/25/2022: Pt. Is able to reach up with the left hand to the left side, top, and back of his head. 03/23/2022: Pt. is now able to more consistently initiate reaching up to his head  with his left hand in preparation for haircare12/18/2023: Pt. is now able to more consistently initiate reaching up to his head with his left hand in preparation for haircare. Eval: Pt. is able to initiate reaching up for hair care with a long handled brush, however is unable to sustain UE's in elevation to perform the task.     Goal status: Achieved  7. Pt. Will independently navigate the w/c through his environment with minA with visual scanning, and hand placement on the controls.  Baseline: 11/16/2022: Deferred 2/2 vision 09/26/2022: Pt. Is now navigating his w/c ouside the home, and around his block. 08/15/2022: Pt. Requires minA to setup hand on  controls and MIN cues to navigate the w/c in wide spaces, requires MIN - MOD cues to navigate the w/c through more narrow doorways, and tighter turns - varies based on good vs bad vision days.07/20/22: Pt. Requires minA to setup hand on controls and MIN cues to navigate the w/c in wide spaces, requires MIN - MOD cues to navigate the w/c through more narrow doorways, and tighter turns - varies based on good vs bad vision days. 05/25/2022: Pt. Requires minA  and  cues to navigate the w/c in wide spaces, and requires Mod cues to navigate the w/c through more narrow doorways, and tighter turns. Pt. Requires max cues for scanning through the environment, and moderate cues for hand placement on the controls.     Goal status: Deferred 2/2 vision    8. Pt. Will improve bilateral grip strength to be able to independently grasp, and pull up blankets, and linens.   Baseline: 05/10/23: Deferred 03/08/23: R grip 0-5 lbs, L grip 8 lbs; pt reports he can consistently pull up blankets and linens independently.  02/13/2023: Continue. Pt. presents with digit PIP, and DIP flexor tightness. 01/09/2023: Continue 12/28/2023: R: 0#, L: 5# Pt. Is now able to more consistently grasp and pull blankets up over him. 11/16/2022: R: 0 L: 8# Pt. Is more consistently grasping his blankets and  attempting to pull them up. 09/26/2022: R: 0  L: 8# Pt. is attempting to grasp, and pull blankets up more, using mostly the left hand. 08/15/2022: Pt. has difficulty securely holding, and pulling up blankets, and linens.    Goal status:Deferred  9. Pt. will consistently actively control the releasing of blankets, covers, and linens from his hands once they are in the desired position over him. Baseline: 12/28/2022: Pt. Is consistently actively able to release linens once they are in the desired position over him. 11/16/2022: Pt. continues to improve with actively releasing blankets form his hands. 09/26/2022: Pt. is improving with actively releasing blankets form his hands. 08/15/2022: Pt. has difficulty with controlled releasing of blankets/linens from his grasp.     Goal status: Achieved    10. Pt. Will improve bilateral UE strength by 2 MM grades to assist with ADLs, and IADLs  Baseline: 01/08/24: Continue, carry goal over from recertification update last session. 12/27/23:  Right: shoulder flexion: 4/5, abduction: 4/5, elbow flexion: 5/5 , extension: 3-/5 wrist extension: 2-/5; Left: shoulder flexion: 4+/5, abduction: 4+/5, elbow flexion: 5/5 , extension: 4+/5  wrist extension: 3+/5  08/27/23:  Right: shoulder flexion: 3+/5, abduction: 4-/5, elbow flexion: 4+/5 , extension: 4-/5 wrist extension: 2/5; Left: shoulder flexion: 4+/5, abduction: 4+/5, elbow flexion: 5/5 , extension: 4+/5  wrist extension: 3-/5  10/23/2023: Right: shoulder flexion: 4-/5, abduction: 4-/5, elbow flexion: 4+/5 , extension: 4-/5 wrist extension: 2/5; Left: shoulder flexion: 4+/5, abduction: 4+/5, elbow flexion: 5/5 , extension: 4+/5  wrist extension: 3+/5  08/27/23:  Right: shoulder flexion: 3+/5, abduction: 4-/5, elbow flexion: 4+/5 , extension: 4-/5 wrist extension: 2/5; Left: shoulder flexion: 4+/5, abduction: 4+/5, elbow flexion: 5/5 , extension: 4/5  wrist extension: 3/5  08/07/23: Continue 05/31/2023: Continue 05/10/23: Right: shoulder  flexion: 3+/5, abduction: 4-/5, elbow flexion: 4+/5 , extension: 4-/5 wrist extension: 2/5; Left: shoulder flexion: 4+/5, abduction: 4+/5, elbow flexion: 5/5 , extension: 4/5  wrist extension: 3/5 03/13/2023: Right: shoulder flexion: 3/5, abduction: 4/5, elbow flexion: 4+/5 , extension: 4-/5 wrist extension: 2/5; Left: shoulder flexion: 4+/5, abduction: 4+/5, elbow flexion: 5/5 , extension: 4/5  wrist extension: 3/5 03/08/23: NT this date  d/t as pt arrived late; 02/13/2023: Continue 01/09/2023: Continue 12/28/2022: Right: shoulder flexion: 3/5, abduction: 4/5, elbow flexion: 4+/5 , extension: 4-/5 wrist extension: 2/5; Left: shoulder flexion: 4+/5, abduction: 4+/5, elbow flexion: 5/5 , extension: 4/5  wrist extension: 3/5 Right: shoulder flexion: 3+/5, abduction: 4/5, elbow flexion: 4+/5 , extension: 4+/5 wrist extension: 2/5; Left: shoulder flexion: 4+/5, abduction: 4+/5, elbow flexion: 5/5 , extension: 4+/5 wrist extension: 3-/5 11/16/2022: Right: shoulder flexion: 3/5, abduction: 4/5, elbow flexion: 4+/5 , extension: 4+/5 wrist extension: 2/5; Left: shoulder flexion: 4+/5, abduction: 4+/5, elbow flexion: 5/5 , extension:  wrist extension: 3-/5 Right: shoulder flexion: 3+/5, abduction: 4/5, elbow flexion: 4+/5 , extension: 4+/5 wrist extension: 2/5; Left: shoulder flexion: 4+/5, abduction: 4+/5, elbow flexion: 5/5 , extension: 4+/5 wrist extension: 3-/5 Goal status: Ongoing  11. Pt. will improve right wrist extension by 10 degrees to initiate reaching for items in preparation for ADLs. Baseline:01/08/24: Continue, carry goal over from recertification update last session.  12/27/23: Right wrist extension: -38(-6) 10/23/23: -30(-8) 08/23/2023: R Wrist Ext: -31(-8)  08/07/23: Continue 05/31/23: Continue 05/10/23: Right wrist extension: -32(22) 03/13/23: -22(35) 03/08/23: NT this date as pt arrived late; 02/13/2023: Continue 01/09/2023: Continue 12/28/2022: -22(34) 11/16/2022: -30(10)    Goal status: Ongoing      12. Pt.  Will require donn/doff a Tank top T-shirt efficiently with supervision and full set-up.    Baseline: 01/08/24: Continue, carry goal over from recertification update last session. 12/27/23: Independent doffing, MaxA doffing 10/23/23: Independent doff the shirt/tank, MaxA  donning. 10/23/23: Pt. Is able to independently doff his shirt, requiring assist to lean forward and loosen the back. 08/07/23: Continue 05/31/2023: Pt. Requires maxA donning a pullover sweatshirt, Mod-maxA doffing it. T-shirt tank TBD 05/10/23: Pt. Presents with difficulty completing, and requires increased time to complete    Goal status: Ongoing    13. Pt. Will independently perform bilateral/bimanual hand tasks needed to fold towels/linens/laundry Baseline: 01/08/24: Continue, carry goal over from recertification update last session. 12/27/23: Pt. Has not been able to work on the this  2/2 multiple cancellations this recertification 2/2 illness, familiy health issues,a nd transportation. 10/23/23: Pt. Continues to have difficulty folding laundry, and linens. 08/23/23: Pt. Continues to have difficulty using bilateral/bimanual hand tasks needed to fold towels/linens/laundry. 08/07/23: Continue 05/31/23: Continue 05/10/23:  Pt. Has difficulty folding towels/lines/laundry using bilateral hands Goal status:Ongoing  14. Pt. Will independently, and efficiently reach his right arm over the side of the chair to pet his dog.    Baseline: 08/23/2023: Pt. Is now able to reach over the side of the armrest of his recliner chair to pet his dog.  08/07/23: Continue 05/31/2023: Continue 05/10/23: Pt. is unable to efficiently reach his right UE over the side of his recliner chair to pet his dog.    Goal status: Achieved    15. Pt. Will independently, and efficiently reach out in front of him to efficiently place items onto his table while sitting in his recliner.     Baseline: 01/08/24: Continue, carry goal over from recertification update last session. 12/27/2023:  Pt. is able to reach out to efficiently reach out in front of him to place items on the table 90% of the time with the LUE, 10% of the time with the RUE 2/2  increased flexor tone. 10/23/23: Pt. Is improving with functional reaching with the left UE out in front of him to place items onto the table. Pt. has difficulty reaching out with the right hand to  place items on the  table. 08/23/2023: Pt. has difficulty consistently reaching out in front of him to place items on the on a tabletop surface with the right hand. 08/07/23: Continue 2/26/225: Continue 05/10/23: Pt. has difficulty reaching out in front of him far enough to efficiently place items on the table while sitting in a recliner chair.    Goal status: Continue/Ongoing     16. Pt. Will improve bilateral hand supination by 10 degrees to be able actively turn items in his hand to look at them independently and efficiently during ADLs/IADLs,    Baseline: 01/08/24: Continue, carry goal over from recertification update last session. 12/27/23: Right: Unable 2/2 increased tightness  Left: supination 20(38) 10/23/23: Right: unable, L: supination 30(38)    Goal status: Continue/Ongoing      17. Pt. Will independently, and efficiently transition between picking up finger foods for dipping, and  picking up utensils , then placing them down on the tabletop surface through 75% of the meal with minimal cues.    Baseline: 01/08/24: Continue, carry goal over from recertification update last session. 12/27/23: Pt. is able to consistently transition between picking up finger foods for dipping, and utensils consistently with the left hand with 100% accuracy time, and the right hand with 5% accuracy. 10/23/23: Pt. requires heavy cues, and assist to grasp finger foods, and utensils, and transition between the two.    Goal status: Continue/Ongoing   ASSESSMENT:  CLINICAL IMPRESSION:  Pt. continues to presents with increased tightness with right forearm supination, as well  as bilateral wrist, and digit extension 2/2 to increased flexor tone, and tightness. Pt. presents with discomfort in the  right forearm, and bilateral digits 2/2 the tightness. Pt. reports the tightness discomfort was aggravated by standing in then stander in PT this afternoon. Pt. continues to be way overdue for Botox injections which is not scheduled for Botox until early November with is contributing to the tightness. Pt. requires proximal support with the RUE, visual cues, as well as a high contrast surface. Pt. continues to be very motivated to work towards regaining function in the bilateral hands. Pt.'s caregivers continue to be very supportive to continue working towards improving BUE functioning, ROM, strength, motor control, [facility] skills in order to maximize independence with daily ADLs, and IADL functioning. Plan to continue with the same goals into the next recertification period.   PERFORMANCE DEFICITS in functional skills including ADLs, IADLs, coordination, dexterity, proprioception, ROM, strength, pain, FMC, GMC, decreased knowledge of use of DME, and UE functional use, cognitive skills including safety awareness, and psychosocial skills including coping strategies, environmental adaptation, habits, and routines and behaviors.   IMPAIRMENTS are limiting patient from ADLs, IADLs, leisure, and social participation.   COMORBIDITIES may have co-morbidities  that affects occupational performance. Patient will benefit from skilled OT to address above impairments and improve overall function.  MODIFICATION OR ASSISTANCE TO COMPLETE EVALUATION: Maximum or significant modification of tasks or assist is necessary to complete an evaluation.  OT OCCUPATIONAL PROFILE AND HISTORY: Comprehensive assessment: Review of records and extensive additional review of physical, cognitive, psychosocial history related to current functional performance.  CLINICAL DECISION MAKING: High - multiple treatment options,  significant modification of task necessary  REHAB POTENTIAL: Fair    EVALUATION COMPLEXITY: High    PLAN: OT FREQUENCY: 2 x's a week  OT DURATION:12 weeks  PLANNED INTERVENTIONS: self care/ADL training, therapeutic exercise, therapeutic activity, neuromuscular re-education, manual therapy, passive range of motion, patient/family education, and cognitive remediation/compensation RECOMMENDED OTHER SERVICES: PT  CONSULTED  AND AGREED WITH PLAN OF CARE: Patient and family member/caregiver  PLAN FOR NEXT SESSION: Review HEP   Richardson Otter, MS, OTR/L  01/23/2024

## 2024-01-23 NOTE — Therapy (Signed)
 OUTPATIENT PHYSICAL THERAPY NEURO TREATMENT  Patient Name: Paul Hall MRN: [id number] DOB:[birth year], 28 y.o., male Today's Date: 01/23/2024   PCP:  Morene Sous, PA-C  REFERRING PROVIDER: Morene Sous, PA-C  END OF SESSION:  PT End of Session - 01/22/24 1126     Visit Number 181    Number of Visits 200   date corrected from most recent recert   Date for Recertification  03/18/24    Authorization Type [employer] [place] [place] [employer]    Authorization Time Period 01/04/21-03/29/21; Recert 03/24/2021-06/16/2021; Recert 09/15/2021- 12/08/2021; Recert 12/13/2021-03/07/2022    Progress Note Due on Visit 190    PT Start Time 1539    PT Stop Time 1618    PT Time Calculation (min) 39 min    Equipment Utilized During Treatment Gait belt    Activity Tolerance Patient tolerated treatment well    Behavior During Therapy WFL for tasks assessed/performed            Past Medical History:  Diagnosis Date   Diabetes mellitus (HCC)    Hypertension    Stroke [facility])    Past Surgical History:  Procedure Laterality Date   IVC FILTER PLACEMENT ([facility] HX)     LEG SURGERY     PEG TUBE PLACEMENT     TRACHEOSTOMY     Patient Active Problem List   Diagnosis Date Noted   Sepsis due to vancomycin  resistant Enterococcus species (HCC) 06/06/2019   SIRS (systemic inflammatory response syndrome) (HCC) 06/05/2019   Acute lower UTI 06/05/2019   VRE (vancomycin -resistant Enterococci) infection 06/05/2019   Anemia 06/05/2019   Skin ulcer of sacrum with necrosis of muscle (HCC)    Urinary retention    Type 2 diabetes mellitus without complication, with long-term current use of insulin  (HCC)    Tachycardia    Lower extremity edema    Acute metabolic encephalopathy    Obstructive sleep apnea    Morbid obesity with BMI of 60.0-69.9, adult (HCC)    Goals of care, counseling/discussion    Palliative care encounter    Sepsis (HCC) 04/27/2019   H/O insulin  dependent diabetes mellitus 04/27/2019    History of CVA with residual deficit 04/27/2019   Seizure disorder (HCC) 04/27/2019   Decubitus ulcer of sacral region, stage 4 (HCC) 04/27/2019    ONSET DATE: October 2020  REFERRING DIAG: Cerebral infarction, unspecified   THERAPY DIAG:  Muscle weakness (generalized)  Other lack of coordination  Difficulty in walking, not elsewhere classified  History of CVA with residual deficit  Unsteadiness on feet  Other abnormalities of gait and mobility  Low vision, both eyes  Abnormality of gait and mobility  Rationale for Evaluation and Treatment: Rehabilitation  SUBJECTIVE:  SUBJECTIVE STATEMENT: Patient reports doing better overall and agreeable to try to attempt standing using standing frame.  Pt accompanied by: family member  PERTINENT HISTORY: Paul Hall is a 28 y/o male who presents with severe weakness, quadriparesis, altered sensorium, and visual impairment s/p critical illness and prolonged hospitalization. Pt hospitalized in October 2020 with ARDS 2/2 COVID19 infection. Pt sustained a complex and lengthy hospitalization which included tracheostomy, prolonged sedation, ECMO. In this period pt sustained CVA and SDH. Pt has now been liberated from tracheostomy and G-tube. Pt has since been hospitalized for wound infection and UTI. Pt lives with parents at home, has hospital bed and left chair, hoyer lift transfers, and power WC for mobility needs. Pt needs heavy physical assistance with ADL 2/2 BUE contractures and motor dysfunction   PAIN:  Are you having pain? No  PRECAUTIONS: Fall  RED FLAGS: None   WEIGHT BEARING RESTRICTIONS: No  FALLS: Has patient fallen in last 6 months? No  LIVING ENVIRONMENT: Lives with: lives with their family Lives in: House/apartment Stairs:  Has  following equipment at home: Wheelchair (power) and hospital bed, hoyer lift, Sit to stand lift  PLOF: Independent  PATIENT GOALS: I want to walk  OBJECTIVE:  Note: Objective measures were completed at Evaluation unless otherwise noted.  DIAGNOSTIC FINDINGS: CLINICAL DATA:  Altered mental status.  History of COVID and stroke.   EXAM: CT HEAD WITHOUT CONTRAST   TECHNIQUE: Contiguous axial images were obtained from the base of the skull through the vertex without intravenous contrast.   COMPARISON:  CT head 11/11/2012   FINDINGS: Brain: Left cerebral convexity cortical hypodensity with associated cortical calcification extending from the frontal to the parietal lobe. Extensive area of volume loss and low-density compatible with chronic infarction. This is noted on prior reports from [facility] Probable chronic watershed infarct. Additional small areas of hypodensity in the right frontal white matter consistent with chronic ischemia.   Negative for acute infarct. No acute hemorrhage or mass. Ventricle size normal without midline shift.   Vascular: Normal arterial flow voids.   Skull: Negative   Sinuses/Orbits: Mucosal edema paranasal sinuses with air-fluid level left sphenoid sinus. Negative orbit   Other: None   IMPRESSION: Extensive watershed type infarct over the left cerebral convexity with cortical calcification. This appears chronic. No associated hemorrhage, hydrocephalus, or midline shift.   These results were called by telephone at the time of interpretation on 05/09/2019 at 3:12 pm to provider [name] , who verbally acknowledged these results.     Electronically Signed   By: [name] M.D.   On: 05/09/2019 15:13  COGNITION: Overall cognitive status: Impaired   SENSATION: Light touch: WFL  COORDINATION: Delayed with BLE  EDEMA:  None observed  MUSCLE TONE: RLE: Moderate  POSTURE: rounded shoulders and forward head  LOWER EXTREMITY ROM:      Active  Right Eval Left Eval  Hip flexion    Hip extension    Hip abduction    Hip adduction    Hip internal rotation    Hip external rotation    Knee flexion    Knee extension    Ankle dorsiflexion    Ankle plantarflexion    Ankle inversion    Ankle eversion     (Blank rows = not tested)  LOWER EXTREMITY MMT:    MMT Right Eval Left Eval  Hip flexion    Hip extension    Hip abduction    Hip adduction    Hip internal rotation  Hip external rotation    Knee flexion    Knee extension    Ankle dorsiflexion    Ankle plantarflexion    Ankle inversion    Ankle eversion    (Blank rows = not tested)  BED MOBILITY:  Findings: Rolling to Right Max A Rolling to Left Max A  TRANSFERS: Sit to stand: Max A x 2 - just able to clear gluteals off mat  Assistive device utilized: HHA x2 to stand- dependent on hoyer for actual transfers     Stand to sit: Max A  Assistive device utilized: HHA x 2        GAIT: Findings: Non-Ambulatory  FUNCTIONAL TESTS:  None appropriate at this time  PATIENT SURVEYS:                                                                                                                                TREATMENT DATE: 01/22/2024   TA: Deitra transfer from power w/c to [facility]- dependent on PT and mother to position onto mat then max assist to pull into sitting at [facility].   Trial of standing frame- Increased time with set up today- max assist of 1 person to assist patient in leaning forward and another person to slide gluteal pad under patient's bottom. Strapped feet in; adjusted knee pads (Physical assist to line R LE up secondary to excessive Hip ER)  and then chest strap- Patient was then pumped up into standing using pneumatic lift- Increased tone exhibited in RUE and unable to break tone to position R UE on table top but patient was able to position LUE on table. He was able to stand for > 3 min x 2 trials with rest breaks in between.  Spent time  processing how to make stand effective- did clear bottom well and no obvious pain except RUE due to increased tone with exertion today.      PATIENT EDUCATION: Education details: Exercise technique; Plan for recert today Person educated: Patient and Parent Education method: Explanation Education comprehension: verbalized understanding  HOME EXERCISE PROGRAM: Access Code: [email or web address] URL: [email or web address] Date: 03/23/2022 Prepared by: Reyes London   Exercises - Supine Bridge  - 3 x weekly - 3 sets - 10 reps - 2 hold - Supine Gluteal Sets  - 3 x weekly - 3 sets - 10 reps - 5 sec hold - Supine Quad Set  - 1 x daily - 3 x weekly - 3 sets - 10 reps - 5 hold - Seated Long Arc Quad  - 1 x daily - 7 x weekly - 3 sets - 10 reps - Seated Hip Adduction Squeeze with Ball  - 1 x daily - 3 x weekly - 3 sets - 10 reps - 5 hold - Seated Hip Abduction  - 1 x daily - 3 x weekly - 3 sets - 10 reps - 2 hold  GOALS: Goals reviewed with patient? Yes  SHORT TERM GOALS:  Target date: 02/05/2024  Patient will be independent with updated HEP for improved overall LE strength in home Baseline: HEP continues to be updated as appropriate.  Goal status: NEW  LONG TERM GOALS: Target date: 03/18/2024 -Goal were updated/revised secondary to length of chart.   Patient will perform sit to stand transfer with appropriate AD and max assist of 2 people with 75% consistency to prepare for pregait activities.  Baseline: 09/04/2023- Patient performed several Sit to stand requiring max A+2  with improving posture- able to ext R knee better - near terminal knee and able to clear bottom off mat- still not full but able to contract glutes for standing as erect as possible.  Goal status: ONGOING  2.  Patient will tolerate 2 minutes or more of standing in sit to stand lift or with max assist +2 in order to indicate improved lower extremity weightbearing tolerance for progression to standing in parallel  bars  Baseline:  09/04/2023- Patient performed 3 trials of standing ranging from 17 -47 sec - improving right knee terminal knee ext and more activation upon command with gluteals- able to clear mat table. Continued to require Max A+2 and blocking knees with forearm/trunk support.   Goal status: ONGOING  3.  Patient will perform rolling left to right with Mod assist for improved independence with bed mobility to assist with dressing and positioning in bed.  Baseline: Dependent on caregiver to roll to left; 12/27/2023= Mod to max assist each direction (difficult due to RUE tone)  Goal status: Ongoing  4.  Patient will perform active bridging -able to clear bottom   Off mat for 10 reps with PT/caregiver holding his feet for improved trunk ext strength with standing and to assist with mobility/positioning.  Baseline: Unable to perform 10 reps with consistent clearing of bottom; 12/27/2023 = Patient able to minimally clear his glues off mat table - but successfully 10 reps today.  Goal status: MET  5. Patient will perform active bridging -able to clear bottom   Off mat for 3 sets of 10 reps with PT/caregiver holding his feet for improved trunk ext strength with standing and to assist with mobility/positioning.  Baseline: 12/27/2023- Patient able to complete 10 reps with max effort  Goal status: New     ASSESSMENT:  CLINICAL IMPRESSION: Patient was feeling well so initiated use of demo standing frame today with good results overall. Patient was apprehensive but agreeable to plan to stand and ultimately able to stand x 2 trials adjusting straps/knee pads and UE for best stand. He was limited secondary to overall weakness and some RUE pain with increased tone exhibited today.  Pt will continue to benefit from skilled physical therapy intervention to address impairments, improve QOL, and attain therapy goals.   OBJECTIVE IMPAIRMENTS: Abnormal gait, cardiopulmonary status limiting activity, decreased  activity tolerance, decreased balance, decreased cognition, decreased coordination, decreased endurance, decreased knowledge of condition, decreased knowledge of use of DME, decreased mobility, difficulty walking, decreased ROM, decreased strength, hypomobility, impaired flexibility, impaired sensation, impaired UE functional use, impaired vision/preception, postural dysfunction, obesity, and pain.   ACTIVITY LIMITATIONS: carrying, lifting, bending, sitting, standing, squatting, stairs, transfers, bed mobility, bathing, toileting, dressing, self feeding, reach over head, and hygiene/grooming  PARTICIPATION LIMITATIONS: meal prep, cleaning, laundry, medication management, personal finances, interpersonal relationship, driving, shopping, community activity, occupation, yard work, and church  PERSONAL FACTORS: Time since onset of injury/illness/exacerbation and 3+ comorbidities: DM, CVA, Seizures are also affecting patient's functional outcome.   REHAB POTENTIAL: Good  CLINICAL DECISION MAKING: Evolving/moderate complexity  EVALUATION COMPLEXITY: Moderate  PLAN:  PT FREQUENCY: 1-2x/week  PT DURATION: 12 weeks  PLANNED INTERVENTIONS: 97110-Therapeutic exercises, 97530- Therapeutic activity, 97112- Neuromuscular re-education, 97535- Self Care, [id number]- Manual therapy, and Patient/Family education  PLAN FOR NEXT SESSION:   Resume standing as able  Attempt standing frame if appropriate again Nustep for UE/LE strengthening Mat table dynamic sitting activities.    Reyes LOISE London, PT 01/23/2024, 11:39 AM

## 2024-01-24 ENCOUNTER — Ambulatory Visit: Admitting: Occupational Therapy

## 2024-01-24 ENCOUNTER — Ambulatory Visit

## 2024-01-29 ENCOUNTER — Ambulatory Visit: Admitting: Occupational Therapy

## 2024-01-29 ENCOUNTER — Ambulatory Visit

## 2024-01-31 ENCOUNTER — Ambulatory Visit

## 2024-01-31 ENCOUNTER — Ambulatory Visit: Admitting: Occupational Therapy

## 2024-01-31 DIAGNOSIS — L0232 Furuncle of buttock: Secondary | ICD-10-CM | POA: Diagnosis not present

## 2024-01-31 DIAGNOSIS — I10 Essential (primary) hypertension: Secondary | ICD-10-CM | POA: Diagnosis not present

## 2024-01-31 DIAGNOSIS — G4733 Obstructive sleep apnea (adult) (pediatric): Secondary | ICD-10-CM | POA: Diagnosis not present

## 2024-01-31 DIAGNOSIS — E1169 Type 2 diabetes mellitus with other specified complication: Secondary | ICD-10-CM | POA: Diagnosis not present

## 2024-02-05 ENCOUNTER — Ambulatory Visit: Admitting: Occupational Therapy

## 2024-02-05 ENCOUNTER — Ambulatory Visit: Attending: Physician Assistant

## 2024-02-05 DIAGNOSIS — M6281 Muscle weakness (generalized): Secondary | ICD-10-CM

## 2024-02-05 DIAGNOSIS — H543 Unqualified visual loss, both eyes: Secondary | ICD-10-CM | POA: Diagnosis not present

## 2024-02-05 DIAGNOSIS — I6930 Unspecified sequelae of cerebral infarction: Secondary | ICD-10-CM | POA: Insufficient documentation

## 2024-02-05 DIAGNOSIS — R262 Difficulty in walking, not elsewhere classified: Secondary | ICD-10-CM | POA: Insufficient documentation

## 2024-02-05 DIAGNOSIS — R278 Other lack of coordination: Secondary | ICD-10-CM | POA: Insufficient documentation

## 2024-02-05 DIAGNOSIS — R41841 Cognitive communication deficit: Secondary | ICD-10-CM | POA: Insufficient documentation

## 2024-02-05 DIAGNOSIS — R2689 Other abnormalities of gait and mobility: Secondary | ICD-10-CM | POA: Insufficient documentation

## 2024-02-05 DIAGNOSIS — R2681 Unsteadiness on feet: Secondary | ICD-10-CM | POA: Insufficient documentation

## 2024-02-05 DIAGNOSIS — R269 Unspecified abnormalities of gait and mobility: Secondary | ICD-10-CM | POA: Insufficient documentation

## 2024-02-05 NOTE — Therapy (Signed)
 OUTPATIENT PHYSICAL THERAPY NEURO TREATMENT  Patient Name: Paul Hall MRN: [id number] DOB:[birth year], 28 y.o., male Today's Date: 02/06/2024   PCP:  Morene Sous, PA-C  REFERRING PROVIDER: Morene Sous, PA-C  END OF SESSION:  PT End of Session - 02/05/24 1708     Visit Number 182    Number of Visits 200   date corrected from most recent recert   Date for Recertification  03/18/24    Authorization Type [employer] [place] [employer]    Authorization Time Period 01/04/21-03/29/21; Recert 03/24/2021-06/16/2021; Recert 09/15/2021- 12/08/2021; Recert 12/13/2021-03/07/2022    Progress Note Due on Visit 190    PT Start Time 1536    PT Stop Time 1620    PT Time Calculation (min) 44 min    Equipment Utilized During Treatment Gait belt    Activity Tolerance Patient tolerated treatment well    Behavior During Therapy WFL for tasks assessed/performed            Past Medical History:  Diagnosis Date   Diabetes mellitus (HCC)    Hypertension    Stroke [facility])    Past Surgical History:  Procedure Laterality Date   IVC FILTER PLACEMENT ([facility] HX)     LEG SURGERY     PEG TUBE PLACEMENT     TRACHEOSTOMY     Patient Active Problem List   Diagnosis Date Noted   Sepsis due to vancomycin  resistant Enterococcus species (HCC) 06/06/2019   SIRS (systemic inflammatory response syndrome) (HCC) 06/05/2019   Acute lower UTI 06/05/2019   VRE (vancomycin -resistant Enterococci) infection 06/05/2019   Anemia 06/05/2019   Skin ulcer of sacrum with necrosis of muscle (HCC)    Urinary retention    Type 2 diabetes mellitus without complication, with long-term current use of insulin  (HCC)    Tachycardia    Lower extremity edema    Acute metabolic encephalopathy    Obstructive sleep apnea    Morbid obesity with BMI of 60.0-69.9, adult (HCC)    Goals of care, counseling/discussion    Palliative care encounter    Sepsis (HCC) 04/27/2019   H/O insulin  dependent diabetes mellitus 04/27/2019    History of CVA with residual deficit 04/27/2019   Seizure disorder (HCC) 04/27/2019   Decubitus ulcer of sacral region, stage 4 (HCC) 04/27/2019    ONSET DATE: October 2020  REFERRING DIAG: Cerebral infarction, unspecified   THERAPY DIAG:  Muscle weakness (generalized)  Other lack of coordination  Difficulty in walking, not elsewhere classified  History of CVA with residual deficit  Unsteadiness on feet  Other abnormalities of gait and mobility  Abnormality of gait and mobility  Rationale for Evaluation and Treatment: Rehabilitation  SUBJECTIVE:  SUBJECTIVE STATEMENT: Patient reports doing better overall and agreeable to try to attempt standing using standing frame.  Pt accompanied by: family member  PERTINENT HISTORY: Paul Hall is a 28 y/o male who presents with severe weakness, quadriparesis, altered sensorium, and visual impairment s/p critical illness and prolonged hospitalization. Pt hospitalized in October 2020 with ARDS 2/2 COVID19 infection. Pt sustained a complex and lengthy hospitalization which included tracheostomy, prolonged sedation, ECMO. In this period pt sustained CVA and SDH. Pt has now been liberated from tracheostomy and G-tube. Pt has since been hospitalized for wound infection and UTI. Pt lives with parents at home, has hospital bed and left chair, hoyer lift transfers, and power WC for mobility needs. Pt needs heavy physical assistance with ADL 2/2 BUE contractures and motor dysfunction   PAIN:  Are you having pain? No  PRECAUTIONS: Fall  RED FLAGS: None   WEIGHT BEARING RESTRICTIONS: No  FALLS: Has patient fallen in last 6 months? No  LIVING ENVIRONMENT: Lives with: lives with their family Lives in: House/apartment Stairs:  Has following equipment at  home: Wheelchair (power) and hospital bed, hoyer lift, Sit to stand lift  PLOF: Independent  PATIENT GOALS: I want to walk  OBJECTIVE:  Note: Objective measures were completed at Evaluation unless otherwise noted.  DIAGNOSTIC FINDINGS: CLINICAL DATA:  Altered mental status.  History of COVID and stroke.   EXAM: CT HEAD WITHOUT CONTRAST   TECHNIQUE: Contiguous axial images were obtained from the base of the skull through the vertex without intravenous contrast.   COMPARISON:  CT head 11/11/2012   FINDINGS: Brain: Left cerebral convexity cortical hypodensity with associated cortical calcification extending from the frontal to the parietal lobe. Extensive area of volume loss and low-density compatible with chronic infarction. This is noted on prior reports from [facility]. Probable chronic watershed infarct. Additional small areas of hypodensity in the right frontal white matter consistent with chronic ischemia.   Negative for acute infarct. No acute hemorrhage or mass. Ventricle size normal without midline shift.   Vascular: Normal arterial flow voids.   Skull: Negative   Sinuses/Orbits: Mucosal edema paranasal sinuses with air-fluid level left sphenoid sinus. Negative orbit   Other: None   IMPRESSION: Extensive watershed type infarct over the left cerebral convexity with cortical calcification. This appears chronic. No associated hemorrhage, hydrocephalus, or midline shift.   These results were called by telephone at the time of interpretation on 05/09/2019 at 3:12 pm to provider [name] , who verbally acknowledged these results.     Electronically Signed   By: [name] M.D.   On: 05/09/2019 15:13  COGNITION: Overall cognitive status: Impaired   SENSATION: Light touch: WFL  COORDINATION: Delayed with BLE  EDEMA:  None observed  MUSCLE TONE: RLE: Moderate  POSTURE: rounded shoulders and forward head  LOWER EXTREMITY ROM:     Active   Right Eval Left Eval  Hip flexion    Hip extension    Hip abduction    Hip adduction    Hip internal rotation    Hip external rotation    Knee flexion    Knee extension    Ankle dorsiflexion    Ankle plantarflexion    Ankle inversion    Ankle eversion     (Blank rows = not tested)  LOWER EXTREMITY MMT:    MMT Right Eval Left Eval  Hip flexion    Hip extension    Hip abduction    Hip adduction    Hip internal rotation  Hip external rotation    Knee flexion    Knee extension    Ankle dorsiflexion    Ankle plantarflexion    Ankle inversion    Ankle eversion    (Blank rows = not tested)  BED MOBILITY:  Findings: Rolling to Right Max A Rolling to Left Max A  TRANSFERS: Sit to stand: Max A x 2 - just able to clear gluteals off mat  Assistive device utilized: HHA x2 to stand- dependent on hoyer for actual transfers     Stand to sit: Max A  Assistive device utilized: HHA x 2        GAIT: Findings: Non-Ambulatory  FUNCTIONAL TESTS:  None appropriate at this time  PATIENT SURVEYS:                                                                                                                                TREATMENT DATE: 02/05/2024   TA: Deitra transfer from power w/c to [facility]- dependent on PT and mother to position onto mat then max assist to pull into sitting at [facility].   Standing frame - set up padding and foot/knee placement- Some assist with rotating hips to bring Knees into more neutral position for proper support of knee pads. Paitent was lifted for a total of 3 trials today- initially able to stand almost nearly erect but patient complained of right thigh pain and requested to sit - then on 2nd trial- padding slipped and quickly lowered back down- slipping out of support on way down but landed in seated position on mat. 3rd trial- adjusted bottom padding and patient performed one more trial- able to stand about 70% erect- still complaining some of R LE soreness  and felt like he was slipping so lowered back down.  Increased time with set up today- max assist of 1 person to assist patient in leaning forward and another person to slide gluteal pad under patient's bottom. Strapped feet in- adjusted knee pads (Physical assist to line R LE up secondary to excessive Hip ER)  and then chest strap   PATIENT EDUCATION: Education details: Exercise technique; Plan for recert today Person educated: Patient and Parent Education method: Explanation Education comprehension: verbalized understanding  HOME EXERCISE PROGRAM: Access Code: [email or web address] URL: [email or web address] Date: 03/23/2022 Prepared by: Reyes London   Exercises - Supine Bridge  - 3 x weekly - 3 sets - 10 reps - 2 hold - Supine Gluteal Sets  - 3 x weekly - 3 sets - 10 reps - 5 sec hold - Supine Quad Set  - 1 x daily - 3 x weekly - 3 sets - 10 reps - 5 hold - Seated Long Arc Quad  - 1 x daily - 7 x weekly - 3 sets - 10 reps - Seated Hip Adduction Squeeze with Ball  - 1 x daily - 3 x weekly - 3 sets - 10 reps - 5 hold - Seated Hip  Abduction  - 1 x daily - 3 x weekly - 3 sets - 10 reps - 2 hold  GOALS: Goals reviewed with patient? Yes  SHORT TERM GOALS: Target date: 02/05/2024  Patient will be independent with updated HEP for improved overall LE strength in home Baseline: HEP continues to be updated as appropriate.  Goal status: NEW  LONG TERM GOALS: Target date: 03/18/2024 -Goal were updated/revised secondary to length of chart.   Patient will perform sit to stand transfer with appropriate AD and max assist of 2 people with 75% consistency to prepare for pregait activities.  Baseline: 09/04/2023- Patient performed several Sit to stand requiring max A+2  with improving posture- able to ext R knee better - near terminal knee and able to clear bottom off mat- still not full but able to contract glutes for standing as erect as possible.  Goal status: ONGOING  2.  Patient will  tolerate 2 minutes or more of standing in sit to stand lift or with max assist +2 in order to indicate improved lower extremity weightbearing tolerance for progression to standing in parallel bars  Baseline:  09/04/2023- Patient performed 3 trials of standing ranging from 17 -47 sec - improving right knee terminal knee ext and more activation upon command with gluteals- able to clear mat table. Continued to require Max A+2 and blocking knees with forearm/trunk support.   Goal status: ONGOING  3.  Patient will perform rolling left to right with Mod assist for improved independence with bed mobility to assist with dressing and positioning in bed.  Baseline: Dependent on caregiver to roll to left; 12/27/2023= Mod to max assist each direction (difficult due to RUE tone)  Goal status: Ongoing  4.  Patient will perform active bridging -able to clear bottom   Off mat for 10 reps with PT/caregiver holding his feet for improved trunk ext strength with standing and to assist with mobility/positioning.  Baseline: Unable to perform 10 reps with consistent clearing of bottom; 12/27/2023 = Patient able to minimally clear his glues off mat table - but successfully 10 reps today.  Goal status: MET  5. Patient will perform active bridging -able to clear bottom   Off mat for 3 sets of 10 reps with PT/caregiver holding his feet for improved trunk ext strength with standing and to assist with mobility/positioning.  Baseline: 12/27/2023- Patient able to complete 10 reps with max effort  Goal status: New     ASSESSMENT:  CLINICAL IMPRESSION: Patient was initially able to stand more erect today with standing frame but complained of some right LE soreness with prolonged standing. Attempted 2 more times but will need to work on better placement with bottom pad as it was slipping some today. He was motivated to stand and did better with arm placement. He is going to have botox injections tomorrow so will see if less tone on  next visit.  Pt will continue to benefit from skilled physical therapy intervention to address impairments, improve QOL, and attain therapy goals.   OBJECTIVE IMPAIRMENTS: Abnormal gait, cardiopulmonary status limiting activity, decreased activity tolerance, decreased balance, decreased cognition, decreased coordination, decreased endurance, decreased knowledge of condition, decreased knowledge of use of DME, decreased mobility, difficulty walking, decreased ROM, decreased strength, hypomobility, impaired flexibility, impaired sensation, impaired UE functional use, impaired vision/preception, postural dysfunction, obesity, and pain.   ACTIVITY LIMITATIONS: carrying, lifting, bending, sitting, standing, squatting, stairs, transfers, bed mobility, bathing, toileting, dressing, self feeding, reach over head, and hygiene/grooming  PARTICIPATION LIMITATIONS: meal  prep, cleaning, laundry, medication management, personal finances, interpersonal relationship, driving, shopping, community activity, occupation, yard work, and church  PERSONAL FACTORS: Time since onset of injury/illness/exacerbation and 3+ comorbidities: DM, CVA, Seizures are also affecting patient's functional outcome.   REHAB POTENTIAL: Good  CLINICAL DECISION MAKING: Evolving/moderate complexity  EVALUATION COMPLEXITY: Moderate  PLAN:  PT FREQUENCY: 1-2x/week  PT DURATION: 12 weeks  PLANNED INTERVENTIONS: 97110-Therapeutic exercises, 97530- Therapeutic activity, 97112- Neuromuscular re-education, 97535- Self Care, [id number]- Manual therapy, and Patient/Family education  PLAN FOR NEXT SESSION:  Continue with Standing frame Resume standing as able  Attempt standing frame if appropriate again Nustep for UE/LE strengthening Mat table dynamic sitting activities.    Reyes LOISE London, PT 02/06/2024, 8:20 AM

## 2024-02-05 NOTE — Therapy (Signed)
 . Occupational Therapy Neuro Treatment Note    Patient Name: Paul Hall MRN: [id number] DOB:24-Mar-1996, 28 y.o., male Today's Date: 02/05/2024  PCP: Dr. Bertell REFERRING PROVIDER: Dr. Bertell   .   OT End of Session - 02/05/24 1722     Visit Number [id number]    Number of Visits 180    Date for Recertification  03/20/24    OT Start Time 1620    OT Stop Time 1700    OT Time Calculation (min) 40 min    Equipment Utilized During Treatment tilt in space power wc    Activity Tolerance Patient tolerated treatment well    Behavior During Therapy WFL for tasks assessed/performed                  Past Medical History:  Diagnosis Date   Diabetes mellitus (HCC)    Hypertension    Stroke [facility])    Past Surgical History:  Procedure Laterality Date   IVC FILTER PLACEMENT ([facility] HX)     LEG SURGERY     PEG TUBE PLACEMENT     TRACHEOSTOMY     Patient Active Problem List   Diagnosis Date Noted   Sepsis due to vancomycin  resistant Enterococcus species (HCC) 06/06/2019   SIRS (systemic inflammatory response syndrome) (HCC) 06/05/2019   Acute lower UTI 06/05/2019   VRE (vancomycin -resistant Enterococci) infection 06/05/2019   Anemia 06/05/2019   Skin ulcer of sacrum with necrosis of muscle (HCC)    Urinary retention    Type 2 diabetes mellitus without complication, with long-term current use of insulin  (HCC)    Tachycardia    Lower extremity edema    Acute metabolic encephalopathy    Obstructive sleep apnea    Morbid obesity with BMI of 60.0-69.9, adult (HCC)    Goals of care, counseling/discussion    Palliative care encounter    Sepsis (HCC) 04/27/2019   H/O insulin  dependent diabetes mellitus 04/27/2019   History of CVA with residual deficit 04/27/2019   Seizure disorder (HCC) 04/27/2019   Decubitus ulcer of sacral region, stage 4 (HCC) 04/27/2019   ONSET DATE: 01/2019  REFERRING DIAG: CVA/COVID-19  THERAPY DIAG:  Muscle weakness (generalized)  Rationale for  Evaluation and Treatment Rehabilitation  SUBJECTIVE:    SUBJECTIVE STATEMENT:   Pt. reports that they received the accessible fleeta this weekend, and went out  shopping this weekend.    Pt accompanied by: self and family member  PERTINENT HISTORY:  Pt. is a 28 y.o. male who was diagnosed with COVID-19, and CVA with resultant quadriplegia. Pt. Was then hospitalized with VRE UTI. PMHx includes: urinary retention, seizure disorder, obstructive sleep disorder, DM Type II, Morbid obesity.   PRECAUTIONS: None  WEIGHT BEARING RESTRICTIONS No  PAIN:  No reports of pain Are you having pain? 0/10; UE tightness discomfort  FALLS: Has patient fallen in last 6 months? No  LIVING ENVIRONMENT: Lives with: lives with their family Lives in: House/apartment Stairs: No Level Entry Has following equipment at home: Wheelchair (power) and hoyer lift, sit to stand lift  PLOF: Independent  PATIENT GOALS: To be able to engage in more daily care tasks.  OBJECTIVE:   HAND DOMINANCE: Right  ADLs:  Transfers/ambulation related to ADLs: Eating: Pt. reports being able to hold standard utensils, and is starting to engage more in self-feeding tasks, hand to mouth patterns. Pt. Reports that he does as much as he can with the task, and family assists  with the remainder of the task. Grooming:  Pt. is able to initiate holding an electric toothbrush, and brush his teeth. Family assists LB Dressing: Total Assist UE dressing: Pt. is now able to reach up to actively assist with grasping , and pulling his gown down. Toileting:  Total Assist Bathing: MaxA UB, Total assist LB Tub Shower transfers: N/A Equipment: See above    IADLs: Shopping: Relies on family to assist Light housekeeping: Total Assist Meal Prep: Total Assist Community mobility:   Medication management:  Total Assist  Financial management: N/A Handwriting: Not legible: Pt. Is able to hold a pen with the left hand, and initiate marking the  page. Pt.'s eye glasses were not available  MOBILITY STATUS:  Power w/c  POSTURE COMMENTS:  Pt. Requires position changes in his power w/c  ACTIVITY TOLERANCE: Activity tolerance:  Fair  FUNCTIONAL OUTCOME MEASURES: FOTO: 40 TR score: 45  05/25/2022:   FOTO: 50 TR score: 45  07/20/22: FOTO 55  03/08/23: 51  UPPER EXTREMITY ROM     Active ROM Left eval Left  03/21/2022 Left 05/25/2022 L  07/20/22 Left 07/25/2022 Left  08/15/2022 Left  09/26/2022 Left  11/16/2022 Left 12/28/2022 Left 03/13/23 Left 05/10/23 Left 10/23/23 Left 12/27/23  Shoulder flexion 119 123 125 130  136 138 130 142 144 144 148 148  Shoulder abduction 110 115 115 115  118 121 125 142 140 140 140 140  Shoulder adduction               Shoulder extension               Shoulder internal rotation               Shoulder external rotation               Elbow flexion 135(135) 145 145 145  145    145 145  145  Elbow extension -27(-18) -21(-20) -20(-16)  -25(-10) -23(-10) -21(-10) -20(-18) -20(-18) -20(-17) -17(-16) -12(-8) -12(-6)  Wrist flexion               Wrist extension 10(50) 19(50) 28(50)  30(50) 35(55) 36(55) 36(60) 40(60) 45(60) 45(60) 42(54) 36(54)  Wrist ulnar deviation               Wrist radial deviation               Wrist pronation            60(74) 84(84)  Wrist supination   Limited by flexor tone       10(15)  30(38) 20(38)  (Blank rows = not tested)    Active ROM Right eval Right 03/21/2022 Right 05/25/2022 R  07/20/22 Right  07/25/22 Right 08/15/2022 Right 09/26/2022 Right 11/16/2022 Right 12/28/2022 Right 03/13/23 Right 05/10/23 Right 10/23/23 Right  9/24 /25  Shoulder flexion 106 scaption 105  Scaption 62 flexion 110 scaption 100  105 105 105 112 Scaption 123 Scaption 122  Scaption 142 Scaption 142  Shoulder abduction 114 90 97 100  105 117 120 124 124 122 144 146  Shoulder adduction               Shoulder extension               Shoulder internal rotation               Shoulder  external rotation               Elbow flexion 120(130) 145 145 140  144 140 142 148 148 145  145  Elbow extension -45(-35) -22(-35) -28(-22)  -32(-21) -32(-28) -28(-26) -28(-28) -27(-26) -27(-40) -38(-35) -34(-26) -34(-26)  Wrist flexion               Wrist extension -30(10) -25(10) -10(30)  -10(20) -10(25) -20(40) -30(10) -22(34) -22(35) -32(22) -30(-8) -38(-6)  Wrist ulnar deviation               Wrist radial deviation               Wrist pronation               Wrist supination   Limited by flexor tone                 12/28/2022: Thumb radial abduction: Right: 12(46), Left: 40(54)  Digits: Bilateral 2nd through 5th digit: MP Hyperextension with PIP, an DIP flexion    10/23/23:  Bilateral 2nd through 5th digit MP hyperextension with PIP, and DIP flexion: Pt. has been intermittently able to initate active diigt PIP, and DIP extension following Botox during this past recertification/progress reporting period.     UPPER EXTREMITY MMT:     Left eval Left 03/21/2022 Left  05/25/2022 L 07/20/22 Left  08/15/2022 Left 09/26/2022 Left 11/16/2022 Left 12/28/2022 Left  03/13/2023 Left  05/10/23 Left 10/23/23 Left 12/27/23  Shoulder flexion 3-/5 3/5 3+/5 4+/5 4+/5 4+/5 4+/5 4+/5 4+/5 4+/5 4+/5 4+/5  Shoulder abduction 3-/5 3-/5 4-/5 4+/5 4+/5 4+5 4+/5 4+/5 4+/5 4+/5 4+/5 4+/5  Shoulder adduction              Shoulder extension              Shoulder internal rotation              Shoulder external rotation              Elbow flexion 3/5 3+/5 4-/5 4+/5 5/5 5/5 5/5 5/5 5/5 5/5 5/5 5/5  Elbow extension 2-/5 2/5  4+/5 4+/5 4+/5  4/5 4/5 4/5 through available range 4+/5 through available range 4+/5  Wrist flexion              Wrist extension 2/5 2+/5 3-/5  3-/5 3-/5 3-/5 3/5 3/5 3/5 3+/5 3-/5  Wrist ulnar deviation              Wrist radial deviation              Wrist pronation              Wrist supination              (Blank rows = not tested)  Right eval Right 03/21/2022 Right  05/25/2022  R 07/20/22 Right 08/15/2022 Right 09/26/2022 Right 11/16/2022 Right 12/28/2022 Right 03/13/2023 Right 05/10/23 Right 10/23/23 Right 12/27/23  Shoulder flexion 3-/5 scaption 3-/5 3-/5 3+/5 3+/5 3+/5 3/5 3/5 3/5 3+/5 4-/5 4/5  Shoulder abduction 3-/5 3-/5 3-/5 4/5 4/5 4/5 4/5 4/5 4/5 4/-5 4-/5 4/5  Shoulder adduction              Shoulder extension              Shoulder internal rotation              Shoulder external rotation              Elbow flexion 3/5 3/5 3+/5 4/5 4+/5 4+/5 4+/5 4+/5 4+/5 4+/5 4+/5 5/5  Elbow extension 2-/5 2/5 3-/5 4+/5 4+/5 4+/5 4+/5 4-/5 4-/5 4-/5 4-/5 through available  range 3-/5  Wrist flexion              Wrist extension 2-/5 2-/5 2/5 2/5 2/5 2/5 2/5 2/5 2/5 2/5 2/5 2-/5  Wrist ulnar deviation              Wrist radial deviation              Wrist pronation              Wrist supination                HAND FUNCTION:  Grip strength: Right: 0 lbs; Left: 0 lbs and Lateral pinch: Right: 5 lbs, Left: 2 lbs  03/21/2022: Lateral pinch: Right: 3.5 lbs, Left: 2 lbs  07/20/22: Grip strength: Right: 3 lbs; Left: 4 lbs and Lateral pinch: Right: 5 lbs, Left: 3 lbs  08/15/2022:  Grip strength: Right: 0 lbs; Left: 5 lbs and Lateral pinch: Right: 2 lbs, Left: 4 lbs  09/26/2022  Grip strength: Right: 0 lbs; Left: 8 lbs and Lateral pinch: Right: 2 lbs, Left: 5 lbs  11/16/2022  Grip strength: Right: 0 lbs; Left: 8 lbs  9/25/20204  Grip strength: Right: 0 lbs; Left: 8 lbs  Grip strength: Right: 0 lbs; Left: 8 lbs   03/08/23:  Grip strength: Right: 0 lbs without PROM, 5 lbs after PROM, Left 8 lbs  05/10/23:  Grip strength: TBD  Bilateral digit PIP/DIP flexion contractures with MP hyperextension with attempts for AROM. Pt. is able to tolerate AROM to the bilateral digits at the initial evaluation however, has a history of pain in the digits.  COORDINATION: Eval: Pt. is unable to grasp 9-hole test pegs. Pt. is able to initiate grasping larger pegs, and is able to  hold a pen in the left hand.  07/20/22: 2 min 36 seconds to remove 9 pegs from 9 hole peg test - cues to locate pegs 2/2 low vision. Pt. is able to initiate grasping larger pegs on R hand and is able to hold a pen in the left hand.  08/15/2022:    Vision, and sensation limiting accuracy of 9 hole peg test results. Pt. Was able to grasp and remove vertical pegs intermittently with cues.    05/10/23: TBD  SENSATION: Light touch: Impaired   EDEMA:  N/A  MUSCLE TONE: BUE flexor Spasticity  COGNITION Overall cognitive status: Continue to assess in functional context  VISION:   Subjective report: Pt. was not wearing glasses at the time of the initial eval.  Baseline vision: Vision is very limited. Wears glasses all the time Visual history: History of impaired vision following CVA. Pt. Has received treatment through the [facility] low vision rehabilitation program.   VISION ASSESSMENT: Impaired To be further assessed in functional context  PERCEPTION: Impaired   PRAXIS: Impaired: motor planning  OBSERVATIONS:  Pt reports being on Tramadol   TODAY'S TREATMENT 02/05/24   Moist heat modality to the RUE, and bilateral hands in conjunction with ROM  Therapeutic Ex.:    -BUE PROM was performed with emphasis placed on slow prolonged gentle stretching of distal RUE for elbow extension, and forearm supination, bilateral hands including: wrist/digit flexion/extension including MCPs, PIPs, DIPs of all digits, and wrist flexion/extension, and forearm supination in conjunction with moist heat modality 2/2 flexor tone, and tightness.   PATIENT EDUCATION: Education details: ROM, BUE tone  Person educated: Patient and Parent Education method: Explanation Education comprehension: verbalized understanding  HOME EXERCISE PROGRAM:    Incorporating  the right  hand during self-feeding tasks.   GOALS: Goals reviewed with patient? Yes  SHORT TERM GOALS: Target date: 12/05/2023   To assess splint  fit, and make appropriate adjustments to promote good skin integrity through the palmar surface of the bilateral hands.  Baseline: 05/25/22: Goal currently met, however ongoing as needs to assess splint fit arise. 03/23/2022: Pt. is wearing splints a couple of hours at night bilateral resting hand splints. 03/21/2022: Pt. is wearing splints a couple of hours at night bilateral resting hand splints. Goal status: Deferred   LONG TERM GOALS: Target date: 03/20/2024   FOTO score will Improve by 2 points for Pt. perceived improvement with the assessment specific ADL/IADL tasks.  Baseline: 03/08/23: 51; 12/28/2022: FOTO score: 56; 11/16/2022: 52 09/27/2022: 51 07/20/2022: FOTO 55 05/25/2022: FOTO score: 50,  TR score: 45 Eval: FOTO score: 40, TR score: 45 Goal status: Achieved   2.   Pt. will independently perform oral care for 100% of the task after complete set-up. Baseline: 05/10/23: Pt. Performs 90% of the task. Pt.'s mother assists with 10% of the task for thorough cleaning the hard to reach places way in the back of the oral cavity.03/08/23: not as much assist required proximally, but to adjust positioning of toothbrush; 02/13/23: Pt. Continues to require assist proximally 01/09/2023: Continue 12/28/2022:  Pt. Continues to complete 90% of the task with complete set-up, with visual cues, and occassional assist of the right elbow. 11/16/2022: Pt. Completes 90% of the task with complete set-up. 09/26/2022: Completes 90% of the task, proximal assist at times required at the elbow.  08/15/2022: Completes 90% of the task, proximal assist at times required at the elbow. 07/20/22: completes 90% of task, limited by shoulder flexion. 05/25/2022:  Pt. Is able to initiate and perform oral care for approximately 90% of the task. Complete set-up required. Assi needed only for the very back teeth. 03/23/2022: Pt. Is able to initiate and perform oral care for approximately 75% of the task. Pt. Requires assist at proximally at the  elbow for through oral care. 03/21/2022: Pt. Is able to initiate and perform oral care for approximately 75% of the task. Pt. Requires assist at proximally at the elbow for through oral care. Eval: Pt. is able to initiate using an electric toothbrush. Pt. requires assist for set-up, and assist for thoroughness, and as he Pt. fatigues. Goal status:  Partially met, Goal discontinued  3.  Pt. Will be modified independence with self-feeding for 100% using a swivel spoon, and standard fork after complete set-up Baseline: 05/10/2023: Pt. feeds self 95-100% of the meal. with occasional assist to reposition the utensil. 03/08/23: indep with cup with a handle and lid, assist to reposition eating utensils in hand;  02/13/23:  Pt. Is now able to consistently, and efficiently use a swivel spoon for eating cereal with the left hand.01/09/2023: Continue 12/28/2022: Pt. Is now feeding himself 90% of the time using a swivel spoon with the left hand, and 95% of the time with the fork. Pt. Continues to have difficulty with cutting food. 11/16/2022: 100% with finger foods, Pt. Is initiating with a swivel spoon, and universal cuff for fork use, however requires assist for consistency, and accuracy.  09/26/2022: 90% using the spoon, assist at time with the forearm motion, and grip on the utensils. 100% for finger foods.  08/15/2022:  90% using the spoon, assit at time with the forearm motion, and grip on the utensils. 100% for finger foods-independent with set-up- unsupervised.  07/20/22: self-feeds cereal using spoon  90% of task. 05/25/2022: Pt. Is able to use a spoon to scoop cereal when feeding himself cereal 85% of the time. Pt. Is able to feed himself snack/finger foods 100% of the time. Pt. Continues to work on consistency of  stabilizing a cup/mug when drinking. Pt. Is able to grasp a water  bottle with assist initially, with assist tapering off as he drinks.03/23/2022: Pt. Is able to perform scooping cereal for 75% of the time. Pt.  required assist, and support at the left elbow, and Pt. Presents with limited forearm supination when using the spoon, and bringing it towards his mouth. Pt. Is able to use a fork to spear items, and perform the hand to mouth pattern.  03/21/2022: Pt. Is able to perform scooping cereal for 75% of the time. Pt. required assist, and support at the left elbow, and Pt. presents with limited forearm supination when using the spoon, and bringing it towards his mouth. Pt. Is able to use a fork to spear items, and perform the hand to mouth pattern.  Eval: Pt. is able to hold standard standard utensils. Pt. Performs as much of the task as he, can and has assistance for the remainder. Goal status:  Improved, Achieved  4.  Pt. will improve grasp patterns and consistently grasp 1/4 objects for ADL, and IADL tasks.  Baseline: 01/08/24: Continue, carry goal over from recertification update last session. 12/27/23: Pt. is able to present with increased tightness  2/2 being way overdue for Botox injections, which limits consistency with grasping small objects. 10/23/2023: Pt. is able to initiate grasping small objects visual cues, and increased time, however continues to work towards grasping 1/4 objects efficiently. 08/27/23: Pt. is able to grasp flat discs, and sustain a secure hold on them while moving the UE through various planes. 08/07/23: Continue 05/31/2023: Continue 05/10/23: Pt. is consistently able to grasp 1/2 objects with visual cues. Pt. Requires assist, and increased visual cues to grasp 1/4 objects on an elevated plane.  Pt. Is unable to grasp the flat objects at the tabletop surface. Pt. Is grasping grasping small puppy vitamins.03/08/23: 1/2 objects efficiently; 02/13/2023: 1/2 objects efficiently 01/09/2023: Continue 12/28/2022: Continues to grasp 1/4 pegs with min a + visual cues, consistently grasping 1/2 objects with visual cues 11/16/2022: Continues to grasp 1/4 pegs with min a + visual cues, consistently  grasping 1/2 objects with visual cues 09/26/2022: Continues to grasp 1/4 pegs with min a + visual cues, consistently grasping 1/2 objects with visual cues. 08/15/2022: grasps 1/4 pegs with min a + visual cues, consistently grasping 1/2 objects with visual cues. 07/20/22: grasps 1/4 pegs with min a + visual cues, consistently grasping 1/2 objects with visual cues. 05/25/2022: Pt. Is working on improving consistency of grasping 1/2 objects with visual cues.  03/23/2022: Pt. Is able to grasp 1 objects consistently,and continues to work on the hand patterns needed to grasp 1/2 objects.03/21/2022: Pt. Is able to grasp 1 objects consistently,and continues to work on the hand patterns needed to grasp 1/2 objects. Eval: Pt. is able to grasp 1 objects intermittently using a lateral grasp pattern. Goal status: Ongoing  5.  Pt. will independently write his name legibly with letter sizes under 1. Baseline: 05/10/23: Deferred 03/08/23: Not addressed this period; 02/13/2023: Continue 01/09/2023: Continue. 12/28/2022: Pt. is now using an IPad Pen. 11/16/2022: Pt. Continues to be able to write name with smaller letter size. Pt. Is signing name with a computer styus. Pt. Requires visual cues, and assist 09/26/2022: Pt. Continues to be  able to write name with smaller letter size. Pt. Is signing name with a computer styus. Pt. Requires visual cues, and assist. 08/15/2022: Pt. Is able to write name with smaller letter size. Pt. Is signing name with a computer styus. Pt. Requires visual cues, and assist. 07/20/22: stabilizing assist to write name with 75% legibility with 2 letters. 05/25/2022: Pt. to continue to work towards formulating grasp patterns in preparation for grasping a large width pen. Pt. Requires visual cues. 03/23/2022: Pt. Is able to write his name with modA, however has difficulty with formulating letter sizes less than 2 in size with 50% legibility for the 3 letters of his name.03/21/2022: Pt. Is able to write  his name with modA, however has difficulty with formulating letter sizes less than 2 in size with 50% legibility for the 3 letters of his name. Eval: Pt is able to hold a thin marker with his left hand, and formulate a line, and initiate a circular pattern (Pt. without glasses today) Goal status: Deferred  6. Pt. Will reach up to comb/brush his hair with minA.  Baseline: 05/10/23: Pt. is able to use a pick independently with the left hand with occasional assist to the far side of the left his head. 03/08/23: Pt uses pick in L hand to reach 75% of his head; unable to reach R side of head; 02/13/2023:Pt. is able to use a hair pick with the left hand on the right side of his head, top, and back of the head. Pt. Continues to have difficulty reaching further to the right side of his head with the left. 01/09/2023: Continue 12/28/2022: Pt. Is progressing, and is now able to use a hair pick with the left hand on the right side of his head, top, and back of the head. Pt. Continues to have difficulty reaching further to the right side of his head with the left. 11/16/2022: Pt. Is able to use a hair pick for the left side of his head using his left hand. Pt. Presents with difficulty reaching to the right side of his head.09/26/2022: Pt. Is able to use a hair pick for the left side of his head using his left hand. Pt. Presents with difficulty reaching to the right side of his head. 5/13/202: 75% using a hair pick, assist to the right side of the head. 07/20/22: reaches 75% of head, assist for far R side of head, fatigues quickly. 05/25/2022: Pt. Is able to reach up with the left hand to the left side, top, and back of his head. 03/23/2022: Pt. is now able to more consistently initiate reaching up to his head with his left hand in preparation for haircare12/18/2023: Pt. is now able to more consistently initiate reaching up to his head with his left hand in preparation for haircare. Eval: Pt. is able to initiate reaching up for  hair care with a long handled brush, however is unable to sustain UE's in elevation to perform the task.     Goal status: Achieved  7. Pt. Will independently navigate the w/c through his environment with minA with visual scanning, and hand placement on the controls.  Baseline: 11/16/2022: Deferred 2/2 vision 09/26/2022: Pt. Is now navigating his w/c ouside the home, and around his block. 08/15/2022: Pt. Requires minA to setup hand on controls and MIN cues to navigate the w/c in wide spaces, requires MIN - MOD cues to navigate the w/c through more narrow doorways, and tighter turns - varies based on good vs bad vision  days.07/20/22: Pt. Requires minA to setup hand on controls and MIN cues to navigate the w/c in wide spaces, requires MIN - MOD cues to navigate the w/c through more narrow doorways, and tighter turns - varies based on good vs bad vision days. 05/25/2022: Pt. Requires minA  and  cues to navigate the w/c in wide spaces, and requires Mod cues to navigate the w/c through more narrow doorways, and tighter turns. Pt. Requires max cues for scanning through the environment, and moderate cues for hand placement on the controls.     Goal status: Deferred 2/2 vision    8. Pt. Will improve bilateral grip strength to be able to independently grasp, and pull up blankets, and linens.   Baseline: 05/10/23: Deferred 03/08/23: R grip 0-5 lbs, L grip 8 lbs; pt reports he can consistently pull up blankets and linens independently.  02/13/2023: Continue. Pt. presents with digit PIP, and DIP flexor tightness. 01/09/2023: Continue 12/28/2023: R: 0#, L: 5# Pt. Is now able to more consistently grasp and pull blankets up over him. 11/16/2022: R: 0 L: 8# Pt. Is more consistently grasping his blankets and attempting to pull them up. 09/26/2022: R: 0  L: 8# Pt. is attempting to grasp, and pull blankets up more, using mostly the left hand. 08/15/2022: Pt. has difficulty securely holding, and pulling up blankets, and linens.    Goal  status:Deferred  9. Pt. will consistently actively control the releasing of blankets, covers, and linens from his hands once they are in the desired position over him. Baseline: 12/28/2022: Pt. Is consistently actively able to release linens once they are in the desired position over him. 11/16/2022: Pt. continues to improve with actively releasing blankets form his hands. 09/26/2022: Pt. is improving with actively releasing blankets form his hands. 08/15/2022: Pt. has difficulty with controlled releasing of blankets/linens from his grasp.     Goal status: Achieved    10. Pt. Will improve bilateral UE strength by 2 MM grades to assist with ADLs, and IADLs  Baseline: 01/08/24: Continue, carry goal over from recertification update last session. 12/27/23:  Right: shoulder flexion: 4/5, abduction: 4/5, elbow flexion: 5/5 , extension: 3-/5 wrist extension: 2-/5; Left: shoulder flexion: 4+/5, abduction: 4+/5, elbow flexion: 5/5 , extension: 4+/5  wrist extension: 3+/5  08/27/23:  Right: shoulder flexion: 3+/5, abduction: 4-/5, elbow flexion: 4+/5 , extension: 4-/5 wrist extension: 2/5; Left: shoulder flexion: 4+/5, abduction: 4+/5, elbow flexion: 5/5 , extension: 4+/5  wrist extension: 3-/5  10/23/2023: Right: shoulder flexion: 4-/5, abduction: 4-/5, elbow flexion: 4+/5 , extension: 4-/5 wrist extension: 2/5; Left: shoulder flexion: 4+/5, abduction: 4+/5, elbow flexion: 5/5 , extension: 4+/5  wrist extension: 3+/5  08/27/23:  Right: shoulder flexion: 3+/5, abduction: 4-/5, elbow flexion: 4+/5 , extension: 4-/5 wrist extension: 2/5; Left: shoulder flexion: 4+/5, abduction: 4+/5, elbow flexion: 5/5 , extension: 4/5  wrist extension: 3/5  08/07/23: Continue 05/31/2023: Continue 05/10/23: Right: shoulder flexion: 3+/5, abduction: 4-/5, elbow flexion: 4+/5 , extension: 4-/5 wrist extension: 2/5; Left: shoulder flexion: 4+/5, abduction: 4+/5, elbow flexion: 5/5 , extension: 4/5  wrist extension: 3/5 03/13/2023: Right: shoulder  flexion: 3/5, abduction: 4/5, elbow flexion: 4+/5 , extension: 4-/5 wrist extension: 2/5; Left: shoulder flexion: 4+/5, abduction: 4+/5, elbow flexion: 5/5 , extension: 4/5  wrist extension: 3/5 03/08/23: NT this date d/t as pt arrived late; 02/13/2023: Continue 01/09/2023: Continue 12/28/2022: Right: shoulder flexion: 3/5, abduction: 4/5, elbow flexion: 4+/5 , extension: 4-/5 wrist extension: 2/5; Left: shoulder flexion: 4+/5, abduction: 4+/5, elbow flexion: 5/5 ,  extension: 4/5  wrist extension: 3/5 Right: shoulder flexion: 3+/5, abduction: 4/5, elbow flexion: 4+/5 , extension: 4+/5 wrist extension: 2/5; Left: shoulder flexion: 4+/5, abduction: 4+/5, elbow flexion: 5/5 , extension: 4+/5 wrist extension: 3-/5 11/16/2022: Right: shoulder flexion: 3/5, abduction: 4/5, elbow flexion: 4+/5 , extension: 4+/5 wrist extension: 2/5; Left: shoulder flexion: 4+/5, abduction: 4+/5, elbow flexion: 5/5 , extension:  wrist extension: 3-/5 Right: shoulder flexion: 3+/5, abduction: 4/5, elbow flexion: 4+/5 , extension: 4+/5 wrist extension: 2/5; Left: shoulder flexion: 4+/5, abduction: 4+/5, elbow flexion: 5/5 , extension: 4+/5 wrist extension: 3-/5 Goal status: Ongoing  11. Pt. will improve right wrist extension by 10 degrees to initiate reaching for items in preparation for ADLs. Baseline:01/08/24: Continue, carry goal over from recertification update last session.  12/27/23: Right wrist extension: -38(-6) 10/23/23: -30(-8) 08/23/2023: R Wrist Ext: -31(-8)  08/07/23: Continue 05/31/23: Continue 05/10/23: Right wrist extension: -32(22) 03/13/23: -22(35) 03/08/23: NT this date as pt arrived late; 02/13/2023: Continue 01/09/2023: Continue 12/28/2022: -22(34) 11/16/2022: -30(10)    Goal status: Ongoing      12. Pt. Will require donn/doff a Tank top T-shirt efficiently with supervision and full set-up.    Baseline: 01/08/24: Continue, carry goal over from recertification update last session. 12/27/23: Independent doffing, MaxA doffing  10/23/23: Independent doff the shirt/tank, MaxA  donning. 10/23/23: Pt. Is able to independently doff his shirt, requiring assist to lean forward and loosen the back. 08/07/23: Continue 05/31/2023: Pt. Requires maxA donning a pullover sweatshirt, Mod-maxA doffing it. T-shirt tank TBD 05/10/23: Pt. Presents with difficulty completing, and requires increased time to complete    Goal status: Ongoing    13. Pt. Will independently perform bilateral/bimanual hand tasks needed to fold towels/linens/laundry Baseline: 01/08/24: Continue, carry goal over from recertification update last session. 12/27/23: Pt. Has not been able to work on the this  2/2 multiple cancellations this recertification 2/2 illness, familiy health issues,a nd transportation. 10/23/23: Pt. Continues to have difficulty folding laundry, and linens. 08/23/23: Pt. Continues to have difficulty using bilateral/bimanual hand tasks needed to fold towels/linens/laundry. 08/07/23: Continue 05/31/23: Continue 05/10/23:  Pt. Has difficulty folding towels/lines/laundry using bilateral hands Goal status:Ongoing  14. Pt. Will independently, and efficiently reach his right arm over the side of the chair to pet his dog.    Baseline: 08/23/2023: Pt. Is now able to reach over the side of the armrest of his recliner chair to pet his dog.  08/07/23: Continue 05/31/2023: Continue 05/10/23: Pt. is unable to efficiently reach his right UE over the side of his recliner chair to pet his dog.    Goal status: Achieved    15. Pt. Will independently, and efficiently reach out in front of him to efficiently place items onto his table while sitting in his recliner.     Baseline: 01/08/24: Continue, carry goal over from recertification update last session. 12/27/2023: Pt. is able to reach out to efficiently reach out in front of him to place items on the table 90% of the time with the LUE, 10% of the time with the RUE 2/2  increased flexor tone. 10/23/23: Pt. Is improving with functional  reaching with the left UE out in front of him to place items onto the table. Pt. has difficulty reaching out with the right hand to  place items on the table. 08/23/2023: Pt. has difficulty consistently reaching out in front of him to place items on the on a tabletop surface with the right hand. 08/07/23: Continue 2/26/225: Continue 05/10/23: Pt. has difficulty reaching out  in front of him far enough to efficiently place items on the table while sitting in a recliner chair.    Goal status: Continue/Ongoing     16. Pt. Will improve bilateral hand supination by 10 degrees to be able actively turn items in his hand to look at them independently and efficiently during ADLs/IADLs,    Baseline: 01/08/24: Continue, carry goal over from recertification update last session. 12/27/23: Right: Unable 2/2 increased tightness  Left: supination 20(38) 10/23/23: Right: unable, L: supination 30(38)    Goal status: Continue/Ongoing      17. Pt. Will independently, and efficiently transition between picking up finger foods for dipping, and  picking up utensils , then placing them down on the tabletop surface through 75% of the meal with minimal cues.    Baseline: 01/08/24: Continue, carry goal over from recertification update last session. 12/27/23: Pt. is able to consistently transition between picking up finger foods for dipping, and utensils consistently with the left hand with 100% accuracy time, and the right hand with 5% accuracy. 10/23/23: Pt. requires heavy cues, and assist to grasp finger foods, and utensils, and transition between the two.    Goal status: Continue/Ongoing   ASSESSMENT:  CLINICAL IMPRESSION:  Pt. Has an appointment to receive Botox injections tomorrow. Pt. continues to present with increased tightness with right forearm supination, as well as bilateral wrist, and digit extension 2/2 to increased flexor tone, and tightness. Pt. presents with discomfort in the  right forearm, and bilateral digits 2/2  the tightness. Pt./caregiver reports the tightness tension discomfort was aggravated by standing in then stander in PT this afternoon. Pt. continues to be very motivated to work towards regaining function in the bilateral hands. Pt.'s caregivers continue to be very supportive to continue working towards improving BUE functioning, ROM, strength, motor control, [facility] skills in order to maximize independence with daily ADLs, and IADL functioning. Plan to continue with the same goals into the next recertification period.   PERFORMANCE DEFICITS in functional skills including ADLs, IADLs, coordination, dexterity, proprioception, ROM, strength, pain, FMC, GMC, decreased knowledge of use of DME, and UE functional use, cognitive skills including safety awareness, and psychosocial skills including coping strategies, environmental adaptation, habits, and routines and behaviors.   IMPAIRMENTS are limiting patient from ADLs, IADLs, leisure, and social participation.   COMORBIDITIES may have co-morbidities  that affects occupational performance. Patient will benefit from skilled OT to address above impairments and improve overall function.  MODIFICATION OR ASSISTANCE TO COMPLETE EVALUATION: Maximum or significant modification of tasks or assist is necessary to complete an evaluation.  OT OCCUPATIONAL PROFILE AND HISTORY: Comprehensive assessment: Review of records and extensive additional review of physical, cognitive, psychosocial history related to current functional performance.  CLINICAL DECISION MAKING: High - multiple treatment options, significant modification of task necessary  REHAB POTENTIAL: Fair    EVALUATION COMPLEXITY: High    PLAN: OT FREQUENCY: 2 x's a week  OT DURATION:12 weeks  PLANNED INTERVENTIONS: self care/ADL training, therapeutic exercise, therapeutic activity, neuromuscular re-education, manual therapy, passive range of motion, patient/family education, and cognitive  remediation/compensation RECOMMENDED OTHER SERVICES: PT  CONSULTED AND AGREED WITH PLAN OF CARE: Patient and family member/caregiver  PLAN FOR NEXT SESSION: Review HEP   Richardson Otter, MS, OTR/L  02/05/24

## 2024-02-07 ENCOUNTER — Ambulatory Visit

## 2024-02-07 ENCOUNTER — Ambulatory Visit: Admitting: Occupational Therapy

## 2024-02-07 DIAGNOSIS — H543 Unqualified visual loss, both eyes: Secondary | ICD-10-CM | POA: Diagnosis not present

## 2024-02-07 DIAGNOSIS — R262 Difficulty in walking, not elsewhere classified: Secondary | ICD-10-CM

## 2024-02-07 DIAGNOSIS — I6930 Unspecified sequelae of cerebral infarction: Secondary | ICD-10-CM

## 2024-02-07 DIAGNOSIS — M6281 Muscle weakness (generalized): Secondary | ICD-10-CM

## 2024-02-07 DIAGNOSIS — R2689 Other abnormalities of gait and mobility: Secondary | ICD-10-CM | POA: Diagnosis not present

## 2024-02-07 DIAGNOSIS — R2681 Unsteadiness on feet: Secondary | ICD-10-CM | POA: Diagnosis not present

## 2024-02-07 DIAGNOSIS — R278 Other lack of coordination: Secondary | ICD-10-CM | POA: Diagnosis not present

## 2024-02-07 DIAGNOSIS — R269 Unspecified abnormalities of gait and mobility: Secondary | ICD-10-CM

## 2024-02-07 DIAGNOSIS — R41841 Cognitive communication deficit: Secondary | ICD-10-CM | POA: Diagnosis not present

## 2024-02-07 NOTE — Therapy (Addendum)
 . Occupational Therapy Neuro Treatment Note    Patient Name: Paul Hall MRN: [id number] DOB:July 10, 1995, 28 y.o., male Today's Date: 02/07/2024  PCP: Dr. Bertell REFERRING PROVIDER: Dr. Bertell   .   OT End of Session - 02/07/24 2335     Visit Number 115    Number of Visits 180    Date for Recertification  03/20/24    Authorization Type    OT Start Time 1540    OT Stop Time 1615    OT Time Calculation (min) 35 min    Equipment Utilized During Treatment tilt in space power wc    Activity Tolerance Patient tolerated treatment well    Behavior During Therapy WFL for tasks assessed/performed                  Past Medical History:  Diagnosis Date   Diabetes mellitus (HCC)    Hypertension    Stroke [facility])    Past Surgical History:  Procedure Laterality Date   IVC FILTER PLACEMENT ([facility] HX)     LEG SURGERY     PEG TUBE PLACEMENT     TRACHEOSTOMY     Patient Active Problem List   Diagnosis Date Noted   Sepsis due to vancomycin  resistant Enterococcus species (HCC) 06/06/2019   SIRS (systemic inflammatory response syndrome) (HCC) 06/05/2019   Acute lower UTI 06/05/2019   VRE (vancomycin -resistant Enterococci) infection 06/05/2019   Anemia 06/05/2019   Skin ulcer of sacrum with necrosis of muscle (HCC)    Urinary retention    Type 2 diabetes mellitus without complication, with long-term current use of insulin  (HCC)    Tachycardia    Lower extremity edema    Acute metabolic encephalopathy    Obstructive sleep apnea    Morbid obesity with BMI of 60.0-69.9, adult (HCC)    Goals of care, counseling/discussion    Palliative care encounter    Sepsis (HCC) 04/27/2019   H/O insulin  dependent diabetes mellitus 04/27/2019   History of CVA with residual deficit 04/27/2019   Seizure disorder (HCC) 04/27/2019   Decubitus ulcer of sacral region, stage 4 (HCC) 04/27/2019   ONSET DATE: 01/2019  REFERRING DIAG: CVA/COVID-19  THERAPY DIAG:  Muscle weakness  (generalized)  Rationale for Evaluation and Treatment Rehabilitation  SUBJECTIVE:    SUBJECTIVE STATEMENT:   Pt. reports that they were late due to yellowjacket bee in the car.   Pt accompanied by: self and family member  PERTINENT HISTORY:  Pt. is a 28 y.o. male who was diagnosed with COVID-19, and CVA with resultant quadriplegia. Pt. Was then hospitalized with VRE UTI. PMHx includes: urinary retention, seizure disorder, obstructive sleep disorder, DM Type II, Morbid obesity.   PRECAUTIONS: None  WEIGHT BEARING RESTRICTIONS No  PAIN:  No reports of pain Are you having pain? 0/10; UE tightness discomfort  FALLS: Has patient fallen in last 6 months? No  LIVING ENVIRONMENT: Lives with: lives with their family Lives in: House/apartment Stairs: No Level Entry Has following equipment at home: Wheelchair (power) and hoyer lift, sit to stand lift  PLOF: Independent  PATIENT GOALS: To be able to engage in more daily care tasks.  OBJECTIVE:   HAND DOMINANCE: Right  ADLs:  Transfers/ambulation related to ADLs: Eating: Pt. reports being able to hold standard utensils, and is starting to engage more in self-feeding tasks, hand to mouth patterns. Pt. Reports that he does as much as he can with the task, and family assists  with the remainder of the task. Grooming:  Pt. is able to initiate holding an electric toothbrush, and brush his teeth. Family assists LB Dressing: Total Assist UE dressing: Pt. is now able to reach up to actively assist with grasping , and pulling his gown down. Toileting:  Total Assist Bathing: MaxA UB, Total assist LB Tub Shower transfers: N/A Equipment: See above    IADLs: Shopping: Relies on family to assist Light housekeeping: Total Assist Meal Prep: Total Assist Community mobility:   Medication management:  Total Assist  Financial management: N/A Handwriting: Not legible: Pt. Is able to hold a pen with the left hand, and initiate marking the page.  Pt.'s eye glasses were not available  MOBILITY STATUS:  Power w/c  POSTURE COMMENTS:  Pt. Requires position changes in his power w/c  ACTIVITY TOLERANCE: Activity tolerance:  Fair  FUNCTIONAL OUTCOME MEASURES: FOTO: 40 TR score: 45  05/25/2022:   FOTO: 50 TR score: 45  07/20/22: FOTO 55  03/08/23: 51  UPPER EXTREMITY ROM     Active ROM Left eval Left  03/21/2022 Left 05/25/2022 L  07/20/22 Left 07/25/2022 Left  08/15/2022 Left  09/26/2022 Left  11/16/2022 Left 12/28/2022 Left 03/13/23 Left 05/10/23 Left 10/23/23 Left 12/27/23  Shoulder flexion 119 123 125 130  136 138 130 142 144 144 148 148  Shoulder abduction 110 115 115 115  118 121 125 142 140 140 140 140  Shoulder adduction               Shoulder extension               Shoulder internal rotation               Shoulder external rotation               Elbow flexion 135(135) 145 145 145  145    145 145  145  Elbow extension -27(-18) -21(-20) -20(-16)  -25(-10) -23(-10) -21(-10) -20(-18) -20(-18) -20(-17) -17(-16) -12(-8) -12(-6)  Wrist flexion               Wrist extension 10(50) 19(50) 28(50)  30(50) 35(55) 36(55) 36(60) 40(60) 45(60) 45(60) 42(54) 36(54)  Wrist ulnar deviation               Wrist radial deviation               Wrist pronation            60(74) 84(84)  Wrist supination   Limited by flexor tone       10(15)  30(38) 20(38)  (Blank rows = not tested)    Active ROM Right eval Right 03/21/2022 Right 05/25/2022 R  07/20/22 Right  07/25/22 Right 08/15/2022 Right 09/26/2022 Right 11/16/2022 Right 12/28/2022 Right 03/13/23 Right 05/10/23 Right 10/23/23 Right  9/24 /25  Shoulder flexion 106 scaption 105  Scaption 62 flexion 110 scaption 100  105 105 105 112 Scaption 123 Scaption 122  Scaption 142 Scaption 142  Shoulder abduction 114 90 97 100  105 117 120 124 124 122 144 146  Shoulder adduction               Shoulder extension               Shoulder internal rotation               Shoulder external  rotation               Elbow flexion 120(130) 145 145 140  144 140 142 148 148 145  145  Elbow extension -45(-35) -22(-35) -28(-22)  -32(-21) -32(-28) -28(-26) -28(-28) -27(-26) -27(-40) -38(-35) -34(-26) -34(-26)  Wrist flexion               Wrist extension -30(10) -25(10) -10(30)  -10(20) -10(25) -20(40) -30(10) -22(34) -22(35) -32(22) -30(-8) -38(-6)  Wrist ulnar deviation               Wrist radial deviation               Wrist pronation               Wrist supination   Limited by flexor tone                 12/28/2022: Thumb radial abduction: Right: 12(46), Left: 40(54)  Digits: Bilateral 2nd through 5th digit: MP Hyperextension with PIP, an DIP flexion    10/23/23:  Bilateral 2nd through 5th digit MP hyperextension with PIP, and DIP flexion: Pt. has been intermittently able to initate active diigt PIP, and DIP extension following Botox during this past recertification/progress reporting period.     UPPER EXTREMITY MMT:     Left eval Left 03/21/2022 Left  05/25/2022 L 07/20/22 Left  08/15/2022 Left 09/26/2022 Left 11/16/2022 Left 12/28/2022 Left  03/13/2023 Left  05/10/23 Left 10/23/23 Left 12/27/23  Shoulder flexion 3-/5 3/5 3+/5 4+/5 4+/5 4+/5 4+/5 4+/5 4+/5 4+/5 4+/5 4+/5  Shoulder abduction 3-/5 3-/5 4-/5 4+/5 4+/5 4+5 4+/5 4+/5 4+/5 4+/5 4+/5 4+/5  Shoulder adduction              Shoulder extension              Shoulder internal rotation              Shoulder external rotation              Elbow flexion 3/5 3+/5 4-/5 4+/5 5/5 5/5 5/5 5/5 5/5 5/5 5/5 5/5  Elbow extension 2-/5 2/5  4+/5 4+/5 4+/5  4/5 4/5 4/5 through available range 4+/5 through available range 4+/5  Wrist flexion              Wrist extension 2/5 2+/5 3-/5  3-/5 3-/5 3-/5 3/5 3/5 3/5 3+/5 3-/5  Wrist ulnar deviation              Wrist radial deviation              Wrist pronation              Wrist supination              (Blank rows = not tested)  Right eval Right 03/21/2022 Right  05/25/2022  R 07/20/22 Right 08/15/2022 Right 09/26/2022 Right 11/16/2022 Right 12/28/2022 Right 03/13/2023 Right 05/10/23 Right 10/23/23 Right 12/27/23  Shoulder flexion 3-/5 scaption 3-/5 3-/5 3+/5 3+/5 3+/5 3/5 3/5 3/5 3+/5 4-/5 4/5  Shoulder abduction 3-/5 3-/5 3-/5 4/5 4/5 4/5 4/5 4/5 4/5 4/-5 4-/5 4/5  Shoulder adduction              Shoulder extension              Shoulder internal rotation              Shoulder external rotation              Elbow flexion 3/5 3/5 3+/5 4/5 4+/5 4+/5 4+/5 4+/5 4+/5 4+/5 4+/5 5/5  Elbow extension 2-/5 2/5 3-/5 4+/5 4+/5 4+/5 4+/5 4-/5 4-/5 4-/5 4-/5 through available  range 3-/5  Wrist flexion              Wrist extension 2-/5 2-/5 2/5 2/5 2/5 2/5 2/5 2/5 2/5 2/5 2/5 2-/5  Wrist ulnar deviation              Wrist radial deviation              Wrist pronation              Wrist supination                HAND FUNCTION:  Grip strength: Right: 0 lbs; Left: 0 lbs and Lateral pinch: Right: 5 lbs, Left: 2 lbs  03/21/2022: Lateral pinch: Right: 3.5 lbs, Left: 2 lbs  07/20/22: Grip strength: Right: 3 lbs; Left: 4 lbs and Lateral pinch: Right: 5 lbs, Left: 3 lbs  08/15/2022:  Grip strength: Right: 0 lbs; Left: 5 lbs and Lateral pinch: Right: 2 lbs, Left: 4 lbs  09/26/2022  Grip strength: Right: 0 lbs; Left: 8 lbs and Lateral pinch: Right: 2 lbs, Left: 5 lbs  11/16/2022  Grip strength: Right: 0 lbs; Left: 8 lbs  9/25/20204  Grip strength: Right: 0 lbs; Left: 8 lbs  Grip strength: Right: 0 lbs; Left: 8 lbs   03/08/23:  Grip strength: Right: 0 lbs without PROM, 5 lbs after PROM, Left 8 lbs  05/10/23:  Grip strength: TBD  Bilateral digit PIP/DIP flexion contractures with MP hyperextension with attempts for AROM. Pt. is able to tolerate AROM to the bilateral digits at the initial evaluation however, has a history of pain in the digits.  COORDINATION: Eval: Pt. is unable to grasp 9-hole test pegs. Pt. is able to initiate grasping larger pegs, and is able to  hold a pen in the left hand.  07/20/22: 2 min 36 seconds to remove 9 pegs from 9 hole peg test - cues to locate pegs 2/2 low vision. Pt. is able to initiate grasping larger pegs on R hand and is able to hold a pen in the left hand.  08/15/2022:    Vision, and sensation limiting accuracy of 9 hole peg test results. Pt. Was able to grasp and remove vertical pegs intermittently with cues.    05/10/23: TBD  SENSATION: Light touch: Impaired   EDEMA:  N/A  MUSCLE TONE: BUE flexor Spasticity  COGNITION Overall cognitive status: Continue to assess in functional context  VISION:   Subjective report: Pt. was not wearing glasses at the time of the initial eval.  Baseline vision: Vision is very limited. Wears glasses all the time Visual history: History of impaired vision following CVA. Pt. Has received treatment through the [facility] low vision rehabilitation program.   VISION ASSESSMENT: Impaired To be further assessed in functional context  PERCEPTION: Impaired   PRAXIS: Impaired: motor planning  OBSERVATIONS:  Pt reports being on Tramadol   TODAY'S TREATMENT 02/07/24   Moist heat modality to the RUE, and bilateral hands in conjunction with ROM  Therapeutic Ex.:    -BUE tolerated PROM was performed with emphasis placed on slow prolonged gentle stretching of distal RUE for elbow extension, and forearm supination, bilateral hands including: wrist/digit flexion/extension including MCPs, PIPs, DIPs of all digits, and wrist flexion/extension, and forearm supination in conjunction with moist heat modality 2/2 flexor tone, and tightness.   PATIENT EDUCATION: Education details: ROM, BUE tone  Person educated: Patient and Parent Education method: Explanation Education comprehension: verbalized understanding  HOME EXERCISE PROGRAM:    Incorporating  the  right hand during self-feeding tasks.   GOALS: Goals reviewed with patient? Yes  SHORT TERM GOALS: Target date: 12/05/2023   To assess  splint fit, and make appropriate adjustments to promote good skin integrity through the palmar surface of the bilateral hands.  Baseline: 05/25/22: Goal currently met, however ongoing as needs to assess splint fit arise. 03/23/2022: Pt. is wearing splints a couple of hours at night bilateral resting hand splints. 03/21/2022: Pt. is wearing splints a couple of hours at night bilateral resting hand splints. Goal status: Deferred   LONG TERM GOALS: Target date: 03/20/2024   FOTO score will Improve by 2 points for Pt. perceived improvement with the assessment specific ADL/IADL tasks.  Baseline: 03/08/23: 51; 12/28/2022: FOTO score: 56; 11/16/2022: 52 09/27/2022: 51 07/20/2022: FOTO 55 05/25/2022: FOTO score: 50,  TR score: 45 Eval: FOTO score: 40, TR score: 45 Goal status: Achieved   2.   Pt. will independently perform oral care for 100% of the task after complete set-up. Baseline: 05/10/23: Pt. Performs 90% of the task. Pt.'s mother assists with 10% of the task for thorough cleaning the hard to reach places way in the back of the oral cavity.03/08/23: not as much assist required proximally, but to adjust positioning of toothbrush; 02/13/23: Pt. Continues to require assist proximally 01/09/2023: Continue 12/28/2022:  Pt. Continues to complete 90% of the task with complete set-up, with visual cues, and occassional assist of the right elbow. 11/16/2022: Pt. Completes 90% of the task with complete set-up. 09/26/2022: Completes 90% of the task, proximal assist at times required at the elbow.  08/15/2022: Completes 90% of the task, proximal assist at times required at the elbow. 07/20/22: completes 90% of task, limited by shoulder flexion. 05/25/2022:  Pt. Is able to initiate and perform oral care for approximately 90% of the task. Complete set-up required. Assi needed only for the very back teeth. 03/23/2022: Pt. Is able to initiate and perform oral care for approximately 75% of the task. Pt. Requires assist at proximally at  the elbow for through oral care. 03/21/2022: Pt. Is able to initiate and perform oral care for approximately 75% of the task. Pt. Requires assist at proximally at the elbow for through oral care. Eval: Pt. is able to initiate using an electric toothbrush. Pt. requires assist for set-up, and assist for thoroughness, and as he Pt. fatigues. Goal status:  Partially met, Goal discontinued  3.  Pt. Will be modified independence with self-feeding for 100% using a swivel spoon, and standard fork after complete set-up Baseline: 05/10/2023: Pt. feeds self 95-100% of the meal. with occasional assist to reposition the utensil. 03/08/23: indep with cup with a handle and lid, assist to reposition eating utensils in hand;  02/13/23:  Pt. Is now able to consistently, and efficiently use a swivel spoon for eating cereal with the left hand.01/09/2023: Continue 12/28/2022: Pt. Is now feeding himself 90% of the time using a swivel spoon with the left hand, and 95% of the time with the fork. Pt. Continues to have difficulty with cutting food. 11/16/2022: 100% with finger foods, Pt. Is initiating with a swivel spoon, and universal cuff for fork use, however requires assist for consistency, and accuracy.  09/26/2022: 90% using the spoon, assist at time with the forearm motion, and grip on the utensils. 100% for finger foods.  08/15/2022:  90% using the spoon, assit at time with the forearm motion, and grip on the utensils. 100% for finger foods-independent with set-up- unsupervised.  07/20/22: self-feeds cereal using  spoon 90% of task. 05/25/2022: Pt. Is able to use a spoon to scoop cereal when feeding himself cereal 85% of the time. Pt. Is able to feed himself snack/finger foods 100% of the time. Pt. Continues to work on consistency of  stabilizing a cup/mug when drinking. Pt. Is able to grasp a water  bottle with assist initially, with assist tapering off as he drinks.03/23/2022: Pt. Is able to perform scooping cereal for 75% of the time.  Pt. required assist, and support at the left elbow, and Pt. Presents with limited forearm supination when using the spoon, and bringing it towards his mouth. Pt. Is able to use a fork to spear items, and perform the hand to mouth pattern.  03/21/2022: Pt. Is able to perform scooping cereal for 75% of the time. Pt. required assist, and support at the left elbow, and Pt. presents with limited forearm supination when using the spoon, and bringing it towards his mouth. Pt. Is able to use a fork to spear items, and perform the hand to mouth pattern.  Eval: Pt. is able to hold standard standard utensils. Pt. Performs as much of the task as he, can and has assistance for the remainder. Goal status:  Improved, Achieved  4.  Pt. will improve grasp patterns and consistently grasp 1/4 objects for ADL, and IADL tasks.  Baseline: 01/08/24: Continue, carry goal over from recertification update last session. 12/27/23: Pt. is able to present with increased tightness  2/2 being way overdue for Botox injections, which limits consistency with grasping small objects. 10/23/2023: Pt. is able to initiate grasping small objects visual cues, and increased time, however continues to work towards grasping 1/4 objects efficiently. 08/27/23: Pt. is able to grasp flat discs, and sustain a secure hold on them while moving the UE through various planes. 08/07/23: Continue 05/31/2023: Continue 05/10/23: Pt. is consistently able to grasp 1/2 objects with visual cues. Pt. Requires assist, and increased visual cues to grasp 1/4 objects on an elevated plane.  Pt. Is unable to grasp the flat objects at the tabletop surface. Pt. Is grasping grasping small puppy vitamins.03/08/23: 1/2 objects efficiently; 02/13/2023: 1/2 objects efficiently 01/09/2023: Continue 12/28/2022: Continues to grasp 1/4 pegs with min a + visual cues, consistently grasping 1/2 objects with visual cues 11/16/2022: Continues to grasp 1/4 pegs with min a + visual cues,  consistently grasping 1/2 objects with visual cues 09/26/2022: Continues to grasp 1/4 pegs with min a + visual cues, consistently grasping 1/2 objects with visual cues. 08/15/2022: grasps 1/4 pegs with min a + visual cues, consistently grasping 1/2 objects with visual cues. 07/20/22: grasps 1/4 pegs with min a + visual cues, consistently grasping 1/2 objects with visual cues. 05/25/2022: Pt. Is working on improving consistency of grasping 1/2 objects with visual cues.  03/23/2022: Pt. Is able to grasp 1 objects consistently,and continues to work on the hand patterns needed to grasp 1/2 objects.03/21/2022: Pt. Is able to grasp 1 objects consistently,and continues to work on the hand patterns needed to grasp 1/2 objects. Eval: Pt. is able to grasp 1 objects intermittently using a lateral grasp pattern. Goal status: Ongoing  5.  Pt. will independently write his name legibly with letter sizes under 1. Baseline: 05/10/23: Deferred 03/08/23: Not addressed this period; 02/13/2023: Continue 01/09/2023: Continue. 12/28/2022: Pt. is now using an IPad Pen. 11/16/2022: Pt. Continues to be able to write name with smaller letter size. Pt. Is signing name with a computer styus. Pt. Requires visual cues, and assist 09/26/2022: Pt. Continues to  be able to write name with smaller letter size. Pt. Is signing name with a computer styus. Pt. Requires visual cues, and assist. 08/15/2022: Pt. Is able to write name with smaller letter size. Pt. Is signing name with a computer styus. Pt. Requires visual cues, and assist. 07/20/22: stabilizing assist to write name with 75% legibility with 2 letters. 05/25/2022: Pt. to continue to work towards formulating grasp patterns in preparation for grasping a large width pen. Pt. Requires visual cues. 03/23/2022: Pt. Is able to write his name with modA, however has difficulty with formulating letter sizes less than 2 in size with 50% legibility for the 3 letters of his name.03/21/2022: Pt. Is  able to write his name with modA, however has difficulty with formulating letter sizes less than 2 in size with 50% legibility for the 3 letters of his name. Eval: Pt is able to hold a thin marker with his left hand, and formulate a line, and initiate a circular pattern (Pt. without glasses today) Goal status: Deferred  6. Pt. Will reach up to comb/brush his hair with minA.  Baseline: 05/10/23: Pt. is able to use a pick independently with the left hand with occasional assist to the far side of the left his head. 03/08/23: Pt uses pick in L hand to reach 75% of his head; unable to reach R side of head; 02/13/2023:Pt. is able to use a hair pick with the left hand on the right side of his head, top, and back of the head. Pt. Continues to have difficulty reaching further to the right side of his head with the left. 01/09/2023: Continue 12/28/2022: Pt. Is progressing, and is now able to use a hair pick with the left hand on the right side of his head, top, and back of the head. Pt. Continues to have difficulty reaching further to the right side of his head with the left. 11/16/2022: Pt. Is able to use a hair pick for the left side of his head using his left hand. Pt. Presents with difficulty reaching to the right side of his head.09/26/2022: Pt. Is able to use a hair pick for the left side of his head using his left hand. Pt. Presents with difficulty reaching to the right side of his head. 5/13/202: 75% using a hair pick, assist to the right side of the head. 07/20/22: reaches 75% of head, assist for far R side of head, fatigues quickly. 05/25/2022: Pt. Is able to reach up with the left hand to the left side, top, and back of his head. 03/23/2022: Pt. is now able to more consistently initiate reaching up to his head with his left hand in preparation for haircare12/18/2023: Pt. is now able to more consistently initiate reaching up to his head with his left hand in preparation for haircare. Eval: Pt. is able to initiate  reaching up for hair care with a long handled brush, however is unable to sustain UE's in elevation to perform the task.     Goal status: Achieved  7. Pt. Will independently navigate the w/c through his environment with minA with visual scanning, and hand placement on the controls.  Baseline: 11/16/2022: Deferred 2/2 vision 09/26/2022: Pt. Is now navigating his w/c ouside the home, and around his block. 08/15/2022: Pt. Requires minA to setup hand on controls and MIN cues to navigate the w/c in wide spaces, requires MIN - MOD cues to navigate the w/c through more narrow doorways, and tighter turns - varies based on good vs bad  vision days.07/20/22: Pt. Requires minA to setup hand on controls and MIN cues to navigate the w/c in wide spaces, requires MIN - MOD cues to navigate the w/c through more narrow doorways, and tighter turns - varies based on good vs bad vision days. 05/25/2022: Pt. Requires minA  and  cues to navigate the w/c in wide spaces, and requires Mod cues to navigate the w/c through more narrow doorways, and tighter turns. Pt. Requires max cues for scanning through the environment, and moderate cues for hand placement on the controls.     Goal status: Deferred 2/2 vision    8. Pt. Will improve bilateral grip strength to be able to independently grasp, and pull up blankets, and linens.   Baseline: 05/10/23: Deferred 03/08/23: R grip 0-5 lbs, L grip 8 lbs; pt reports he can consistently pull up blankets and linens independently.  02/13/2023: Continue. Pt. presents with digit PIP, and DIP flexor tightness. 01/09/2023: Continue 12/28/2023: R: 0#, L: 5# Pt. Is now able to more consistently grasp and pull blankets up over him. 11/16/2022: R: 0 L: 8# Pt. Is more consistently grasping his blankets and attempting to pull them up. 09/26/2022: R: 0  L: 8# Pt. is attempting to grasp, and pull blankets up more, using mostly the left hand. 08/15/2022: Pt. has difficulty securely holding, and pulling up blankets, and  linens.    Goal status:Deferred  9. Pt. will consistently actively control the releasing of blankets, covers, and linens from his hands once they are in the desired position over him. Baseline: 12/28/2022: Pt. Is consistently actively able to release linens once they are in the desired position over him. 11/16/2022: Pt. continues to improve with actively releasing blankets form his hands. 09/26/2022: Pt. is improving with actively releasing blankets form his hands. 08/15/2022: Pt. has difficulty with controlled releasing of blankets/linens from his grasp.     Goal status: Achieved    10. Pt. Will improve bilateral UE strength by 2 MM grades to assist with ADLs, and IADLs  Baseline: 01/08/24: Continue, carry goal over from recertification update last session. 12/27/23:  Right: shoulder flexion: 4/5, abduction: 4/5, elbow flexion: 5/5 , extension: 3-/5 wrist extension: 2-/5; Left: shoulder flexion: 4+/5, abduction: 4+/5, elbow flexion: 5/5 , extension: 4+/5  wrist extension: 3+/5  08/27/23:  Right: shoulder flexion: 3+/5, abduction: 4-/5, elbow flexion: 4+/5 , extension: 4-/5 wrist extension: 2/5; Left: shoulder flexion: 4+/5, abduction: 4+/5, elbow flexion: 5/5 , extension: 4+/5  wrist extension: 3-/5  10/23/2023: Right: shoulder flexion: 4-/5, abduction: 4-/5, elbow flexion: 4+/5 , extension: 4-/5 wrist extension: 2/5; Left: shoulder flexion: 4+/5, abduction: 4+/5, elbow flexion: 5/5 , extension: 4+/5  wrist extension: 3+/5  08/27/23:  Right: shoulder flexion: 3+/5, abduction: 4-/5, elbow flexion: 4+/5 , extension: 4-/5 wrist extension: 2/5; Left: shoulder flexion: 4+/5, abduction: 4+/5, elbow flexion: 5/5 , extension: 4/5  wrist extension: 3/5  08/07/23: Continue 05/31/2023: Continue 05/10/23: Right: shoulder flexion: 3+/5, abduction: 4-/5, elbow flexion: 4+/5 , extension: 4-/5 wrist extension: 2/5; Left: shoulder flexion: 4+/5, abduction: 4+/5, elbow flexion: 5/5 , extension: 4/5  wrist extension: 3/5 03/13/2023:  Right: shoulder flexion: 3/5, abduction: 4/5, elbow flexion: 4+/5 , extension: 4-/5 wrist extension: 2/5; Left: shoulder flexion: 4+/5, abduction: 4+/5, elbow flexion: 5/5 , extension: 4/5  wrist extension: 3/5 03/08/23: NT this date d/t as pt arrived late; 02/13/2023: Continue 01/09/2023: Continue 12/28/2022: Right: shoulder flexion: 3/5, abduction: 4/5, elbow flexion: 4+/5 , extension: 4-/5 wrist extension: 2/5; Left: shoulder flexion: 4+/5, abduction: 4+/5, elbow flexion: 5/5 ,  extension: 4/5  wrist extension: 3/5 Right: shoulder flexion: 3+/5, abduction: 4/5, elbow flexion: 4+/5 , extension: 4+/5 wrist extension: 2/5; Left: shoulder flexion: 4+/5, abduction: 4+/5, elbow flexion: 5/5 , extension: 4+/5 wrist extension: 3-/5 11/16/2022: Right: shoulder flexion: 3/5, abduction: 4/5, elbow flexion: 4+/5 , extension: 4+/5 wrist extension: 2/5; Left: shoulder flexion: 4+/5, abduction: 4+/5, elbow flexion: 5/5 , extension:  wrist extension: 3-/5 Right: shoulder flexion: 3+/5, abduction: 4/5, elbow flexion: 4+/5 , extension: 4+/5 wrist extension: 2/5; Left: shoulder flexion: 4+/5, abduction: 4+/5, elbow flexion: 5/5 , extension: 4+/5 wrist extension: 3-/5 Goal status: Ongoing  11. Pt. will improve right wrist extension by 10 degrees to initiate reaching for items in preparation for ADLs. Baseline:01/08/24: Continue, carry goal over from recertification update last session.  12/27/23: Right wrist extension: -38(-6) 10/23/23: -30(-8) 08/23/2023: R Wrist Ext: -31(-8)  08/07/23: Continue 05/31/23: Continue 05/10/23: Right wrist extension: -32(22) 03/13/23: -22(35) 03/08/23: NT this date as pt arrived late; 02/13/2023: Continue 01/09/2023: Continue 12/28/2022: -22(34) 11/16/2022: -30(10)    Goal status: Ongoing      12. Pt. Will require donn/doff a Tank top T-shirt efficiently with supervision and full set-up.    Baseline: 01/08/24: Continue, carry goal over from recertification update last session. 12/27/23: Independent  doffing, MaxA doffing 10/23/23: Independent doff the shirt/tank, MaxA  donning. 10/23/23: Pt. Is able to independently doff his shirt, requiring assist to lean forward and loosen the back. 08/07/23: Continue 05/31/2023: Pt. Requires maxA donning a pullover sweatshirt, Mod-maxA doffing it. T-shirt tank TBD 05/10/23: Pt. Presents with difficulty completing, and requires increased time to complete    Goal status: Ongoing    13. Pt. Will independently perform bilateral/bimanual hand tasks needed to fold towels/linens/laundry Baseline: 01/08/24: Continue, carry goal over from recertification update last session. 12/27/23: Pt. Has not been able to work on the this  2/2 multiple cancellations this recertification 2/2 illness, familiy health issues,a nd transportation. 10/23/23: Pt. Continues to have difficulty folding laundry, and linens. 08/23/23: Pt. Continues to have difficulty using bilateral/bimanual hand tasks needed to fold towels/linens/laundry. 08/07/23: Continue 05/31/23: Continue 05/10/23:  Pt. Has difficulty folding towels/lines/laundry using bilateral hands Goal status:Ongoing  14. Pt. Will independently, and efficiently reach his right arm over the side of the chair to pet his dog.    Baseline: 08/23/2023: Pt. Is now able to reach over the side of the armrest of his recliner chair to pet his dog.  08/07/23: Continue 05/31/2023: Continue 05/10/23: Pt. is unable to efficiently reach his right UE over the side of his recliner chair to pet his dog.    Goal status: Achieved    15. Pt. Will independently, and efficiently reach out in front of him to efficiently place items onto his table while sitting in his recliner.     Baseline: 01/08/24: Continue, carry goal over from recertification update last session. 12/27/2023: Pt. is able to reach out to efficiently reach out in front of him to place items on the table 90% of the time with the LUE, 10% of the time with the RUE 2/2  increased flexor tone. 10/23/23: Pt. Is  improving with functional reaching with the left UE out in front of him to place items onto the table. Pt. has difficulty reaching out with the right hand to  place items on the table. 08/23/2023: Pt. has difficulty consistently reaching out in front of him to place items on the on a tabletop surface with the right hand. 08/07/23: Continue 2/26/225: Continue 05/10/23: Pt. has difficulty reaching out  in front of him far enough to efficiently place items on the table while sitting in a recliner chair.    Goal status: Continue/Ongoing     16. Pt. Will improve bilateral hand supination by 10 degrees to be able actively turn items in his hand to look at them independently and efficiently during ADLs/IADLs,    Baseline: 01/08/24: Continue, carry goal over from recertification update last session. 12/27/23: Right: Unable 2/2 increased tightness  Left: supination 20(38) 10/23/23: Right: unable, L: supination 30(38)    Goal status: Continue/Ongoing      17. Pt. Will independently, and efficiently transition between picking up finger foods for dipping, and  picking up utensils , then placing them down on the tabletop surface through 75% of the meal with minimal cues.    Baseline: 01/08/24: Continue, carry goal over from recertification update last session. 12/27/23: Pt. is able to consistently transition between picking up finger foods for dipping, and utensils consistently with the left hand with 100% accuracy time, and the right hand with 5% accuracy. 10/23/23: Pt. requires heavy cues, and assist to grasp finger foods, and utensils, and transition between the two.    Goal status: Continue/Ongoing   ASSESSMENT:  CLINICAL IMPRESSION:  Patient was late to the appointment today, reporting that that they had to contend with a yellowjacket bee in the [place]. Pt. had an appointment to receive Botox injections yesterday. Pt. presents with tightness with right forearm supination, as well as bilateral wrist, and digit extension  2/2 to increased flexor tone, and tightness however, however is not as tight as it was in the previous sessions. Pt. presents with discomfort in the  right forearm, and bilateral digits 2/2 the tightness.  And Botox injections yesterday. Pt. continues to be very motivated to work towards regaining function in the bilateral hands. Pt.'s caregivers continue to be very supportive to continue working towards improving BUE functioning, ROM, strength, motor control, [facility] skills in order to maximize independence with daily ADLs, and IADL functioning. Plan to continue with the same goals into the next recertification period.   PERFORMANCE DEFICITS in functional skills including ADLs, IADLs, coordination, dexterity, proprioception, ROM, strength, pain, FMC, GMC, decreased knowledge of use of DME, and UE functional use, cognitive skills including safety awareness, and psychosocial skills including coping strategies, environmental adaptation, habits, and routines and behaviors.   IMPAIRMENTS are limiting patient from ADLs, IADLs, leisure, and social participation.   COMORBIDITIES may have co-morbidities  that affects occupational performance. Patient will benefit from skilled OT to address above impairments and improve overall function.  MODIFICATION OR ASSISTANCE TO COMPLETE EVALUATION: Maximum or significant modification of tasks or assist is necessary to complete an evaluation.  OT OCCUPATIONAL PROFILE AND HISTORY: Comprehensive assessment: Review of records and extensive additional review of physical, cognitive, psychosocial history related to current functional performance.  CLINICAL DECISION MAKING: High - multiple treatment options, significant modification of task necessary  REHAB POTENTIAL: Fair    EVALUATION COMPLEXITY: High    PLAN: OT FREQUENCY: 2 x's a week  OT DURATION:12 weeks  PLANNED INTERVENTIONS: self care/ADL training, therapeutic exercise, therapeutic activity, neuromuscular  re-education, manual therapy, passive range of motion, patient/family education, and cognitive remediation/compensation RECOMMENDED OTHER SERVICES: PT  CONSULTED AND AGREED WITH PLAN OF CARE: Patient and family member/caregiver  PLAN FOR NEXT SESSION: Review HEP   Richardson Otter, MS, OTR/L  02/07/24

## 2024-02-08 NOTE — Therapy (Signed)
 OUTPATIENT PHYSICAL THERAPY NEURO TREATMENT  Patient Name: Paul Hall MRN: [id number] DOB:11-08-1995, 28 y.o., male Today's Date: 02/08/2024   PCP:  Morene Sous, PA-C  REFERRING PROVIDER: Morene Sous, PA-C  END OF SESSION:  PT End of Session - 02/08/24 1056     Visit Number 183    Number of Visits 200   date corrected from most recent recert   Date for Recertification  03/18/24    Authorization Type [employer] COMM Pro/ [place] Medicaid    Authorization Time Period 01/04/21-03/29/21; Recert 03/24/2021-06/16/2021; Recert 09/15/2021- 12/08/2021; Recert 12/13/2021-03/07/2022    Progress Note Due on Visit 190    PT Start Time 1616    PT Stop Time 1704    PT Time Calculation (min) 48 min    Equipment Utilized During Treatment Gait belt    Activity Tolerance Patient tolerated treatment well    Behavior During Therapy WFL for tasks assessed/performed            Past Medical History:  Diagnosis Date   Diabetes mellitus (HCC)    Hypertension    Stroke [facility])    Past Surgical History:  Procedure Laterality Date   IVC FILTER PLACEMENT ([facility] HX)     LEG SURGERY     PEG TUBE PLACEMENT     TRACHEOSTOMY     Patient Active Problem List   Diagnosis Date Noted   Sepsis due to vancomycin  resistant Enterococcus species (HCC) 06/06/2019   SIRS (systemic inflammatory response syndrome) (HCC) 06/05/2019   Acute lower UTI 06/05/2019   VRE (vancomycin -resistant Enterococci) infection 06/05/2019   Anemia 06/05/2019   Skin ulcer of sacrum with necrosis of muscle (HCC)    Urinary retention    Type 2 diabetes mellitus without complication, with long-term current use of insulin  (HCC)    Tachycardia    Lower extremity edema    Acute metabolic encephalopathy    Obstructive sleep apnea    Morbid obesity with BMI of 60.0-69.9, adult (HCC)    Goals of care, counseling/discussion    Palliative care encounter    Sepsis (HCC) 04/27/2019   H/O insulin  dependent diabetes mellitus 04/27/2019    History of CVA with residual deficit 04/27/2019   Seizure disorder (HCC) 04/27/2019   Decubitus ulcer of sacral region, stage 4 (HCC) 04/27/2019    ONSET DATE: October 2020  REFERRING DIAG: Cerebral infarction, unspecified   THERAPY DIAG:  Muscle weakness (generalized)  Other lack of coordination  Difficulty in walking, not elsewhere classified  History of CVA with residual deficit  Unsteadiness on feet  Other abnormalities of gait and mobility  Abnormality of gait and mobility  Rationale for Evaluation and Treatment: Rehabilitation  SUBJECTIVE:  SUBJECTIVE STATEMENT: Patient reports doing well with no ill effects from last session. States received botox injections into Right UE and no increased pain/soreness today.  Pt accompanied by: family member  PERTINENT HISTORY: Paul Hall is a 28 y/o male who presents with severe weakness, quadriparesis, altered sensorium, and visual impairment s/p critical illness and prolonged hospitalization. Pt hospitalized in October 2020 with ARDS 2/2 COVID19 infection. Pt sustained a complex and lengthy hospitalization which included tracheostomy, prolonged sedation, ECMO. In this period pt sustained CVA and SDH. Pt has now been liberated from tracheostomy and G-tube. Pt has since been hospitalized for wound infection and UTI. Pt lives with parents at home, has hospital bed and left chair, hoyer lift transfers, and power WC for mobility needs. Pt needs heavy physical assistance with ADL 2/2 BUE contractures and motor dysfunction   PAIN:  Are you having pain? No  PRECAUTIONS: Fall  RED FLAGS: None   WEIGHT BEARING RESTRICTIONS: No  FALLS: Has patient fallen in last 6 months? No  LIVING ENVIRONMENT: Lives with: lives with their family Lives in:  House/apartment Stairs:  Has following equipment at home: Wheelchair (power) and hospital bed, hoyer lift, Sit to stand lift  PLOF: Independent  PATIENT GOALS: I want to walk  OBJECTIVE:  Note: Objective measures were completed at Evaluation unless otherwise noted.  DIAGNOSTIC FINDINGS: CLINICAL DATA:  Altered mental status.  History of COVID and stroke.   EXAM: CT HEAD WITHOUT CONTRAST   TECHNIQUE: Contiguous axial images were obtained from the base of the skull through the vertex without intravenous contrast.   COMPARISON:  CT head 11/11/2012   FINDINGS: Brain: Left cerebral convexity cortical hypodensity with associated cortical calcification extending from the frontal to the parietal lobe. Extensive area of volume loss and low-density compatible with chronic infarction. This is noted on prior reports from [facility]. Probable chronic watershed infarct. Additional small areas of hypodensity in the right frontal white matter consistent with chronic ischemia.   Negative for acute infarct. No acute hemorrhage or mass. Ventricle size normal without midline shift.   Vascular: Normal arterial flow voids.   Skull: Negative   Sinuses/Orbits: Mucosal edema paranasal sinuses with air-fluid level left sphenoid sinus. Negative orbit   Other: None   IMPRESSION: Extensive watershed type infarct over the left cerebral convexity with cortical calcification. This appears chronic. No associated hemorrhage, hydrocephalus, or midline shift.   These results were called by telephone at the time of interpretation on 05/09/2019 at 3:12 pm to provider [name] , who verbally acknowledged these results.     Electronically Signed   By: [name] M.D.   On: 05/09/2019 15:13  COGNITION: Overall cognitive status: Impaired   SENSATION: Light touch: WFL  COORDINATION: Delayed with BLE  EDEMA:  None observed  MUSCLE TONE: RLE: Moderate  POSTURE: rounded shoulders and  forward head  LOWER EXTREMITY ROM:     Active  Right Eval Left Eval  Hip flexion    Hip extension    Hip abduction    Hip adduction    Hip internal rotation    Hip external rotation    Knee flexion    Knee extension    Ankle dorsiflexion    Ankle plantarflexion    Ankle inversion    Ankle eversion     (Blank rows = not tested)  LOWER EXTREMITY MMT:    MMT Right Eval Left Eval  Hip flexion    Hip extension    Hip abduction  Hip adduction    Hip internal rotation    Hip external rotation    Knee flexion    Knee extension    Ankle dorsiflexion    Ankle plantarflexion    Ankle inversion    Ankle eversion    (Blank rows = not tested)  BED MOBILITY:  Findings: Rolling to Right Max A Rolling to Left Max A  TRANSFERS: Sit to stand: Max A x 2 - just able to clear gluteals off mat  Assistive device utilized: HHA x2 to stand- dependent on hoyer for actual transfers     Stand to sit: Max A  Assistive device utilized: HHA x 2        GAIT: Findings: Non-Ambulatory  FUNCTIONAL TESTS:  None appropriate at this time  PATIENT SURVEYS:                                                                                                                                TREATMENT DATE: 02/07/2024   TA: Deitra transfer from power w/c to [facility]- dependent on PT and mother to position onto mat then max assist to pull into sitting at [facility].   Standing frame - set up padding and foot/knee placement- Some assist with rotating hips to bring Knees into more neutral position for proper support of knee pads.   3 trials of standing today ranging from 1-2 min. Patient was raises approx 70% but described some R UE sorness and no slippage of bottom padding today. Patient denied any leg pain but did described some tightness throughout his trunk/BUE.  Patient provided brief rest break between each trial.   Self/care:  Went outside with patient and mother to observe his transfers/set up with new  fleeta. Mother able to independently maneuver patient in his W/c into van from side and position straps accordingly.   PATIENT EDUCATION: Education details: Exercise technique; Plan for recert today Person educated: Patient and Parent Education method: Explanation Education comprehension: verbalized understanding  HOME EXERCISE PROGRAM: Access Code: [email or web address] URL: [email or web address] Date: 03/23/2022 Prepared by: Reyes London   Exercises - Supine Bridge  - 3 x weekly - 3 sets - 10 reps - 2 hold - Supine Gluteal Sets  - 3 x weekly - 3 sets - 10 reps - 5 sec hold - Supine Quad Set  - 1 x daily - 3 x weekly - 3 sets - 10 reps - 5 hold - Seated Long Arc Quad  - 1 x daily - 7 x weekly - 3 sets - 10 reps - Seated Hip Adduction Squeeze with Ball  - 1 x daily - 3 x weekly - 3 sets - 10 reps - 5 hold - Seated Hip Abduction  - 1 x daily - 3 x weekly - 3 sets - 10 reps - 2 hold  GOALS: Goals reviewed with patient? Yes  SHORT TERM GOALS: Target date: 02/05/2024  Patient will be independent with updated HEP for  improved overall LE strength in home Baseline: HEP continues to be updated as appropriate.  Goal status: NEW  LONG TERM GOALS: Target date: 03/18/2024 -Goal were updated/revised secondary to length of chart.   Patient will perform sit to stand transfer with appropriate AD and max assist of 2 people with 75% consistency to prepare for pregait activities.  Baseline: 09/04/2023- Patient performed several Sit to stand requiring max A+2  with improving posture- able to ext R knee better - near terminal knee and able to clear bottom off mat- still not full but able to contract glutes for standing as erect as possible.  Goal status: ONGOING  2.  Patient will tolerate 2 minutes or more of standing in sit to stand lift or with max assist +2 in order to indicate improved lower extremity weightbearing tolerance for progression to standing in parallel bars  Baseline:  09/04/2023-  Patient performed 3 trials of standing ranging from 17 -47 sec - improving right knee terminal knee ext and more activation upon command with gluteals- able to clear mat table. Continued to require Max A+2 and blocking knees with forearm/trunk support.   Goal status: ONGOING  3.  Patient will perform rolling left to right with Mod assist for improved independence with bed mobility to assist with dressing and positioning in bed.  Baseline: Dependent on caregiver to roll to left; 12/27/2023= Mod to max assist each direction (difficult due to RUE tone)  Goal status: Ongoing  4.  Patient will perform active bridging -able to clear bottom   Off mat for 10 reps with PT/caregiver holding his feet for improved trunk ext strength with standing and to assist with mobility/positioning.  Baseline: Unable to perform 10 reps with consistent clearing of bottom; 12/27/2023 = Patient able to minimally clear his glues off mat table - but successfully 10 reps today.  Goal status: MET  5. Patient will perform active bridging -able to clear bottom   Off mat for 3 sets of 10 reps with PT/caregiver holding his feet for improved trunk ext strength with standing and to assist with mobility/positioning.  Baseline: 12/27/2023- Patient able to complete 10 reps with max effort  Goal status: New     ASSESSMENT:  CLINICAL IMPRESSION: Patient was able to extend his R UE better after receiving botox yesterday. He was able to place UE on table better but still struggled to stand erect- able to accept about 70% today with increased report of tightness. Observed w/c into van transfer today and patient/mother able to negotiate and demonstrate good overall independence. This will allow patient such a better quality of life and both mom and patient already reported able to get out and go much easier and not dependent on public or pay transportation. Pt will continue to benefit from skilled physical therapy intervention to address  impairments, improve QOL, and attain therapy goals.   OBJECTIVE IMPAIRMENTS: Abnormal gait, cardiopulmonary status limiting activity, decreased activity tolerance, decreased balance, decreased cognition, decreased coordination, decreased endurance, decreased knowledge of condition, decreased knowledge of use of DME, decreased mobility, difficulty walking, decreased ROM, decreased strength, hypomobility, impaired flexibility, impaired sensation, impaired UE functional use, impaired vision/preception, postural dysfunction, obesity, and pain.   ACTIVITY LIMITATIONS: carrying, lifting, bending, sitting, standing, squatting, stairs, transfers, bed mobility, bathing, toileting, dressing, self feeding, reach over head, and hygiene/grooming  PARTICIPATION LIMITATIONS: meal prep, cleaning, laundry, medication management, personal finances, interpersonal relationship, driving, shopping, community activity, occupation, yard work, and church  PERSONAL FACTORS: Time since onset of injury/illness/exacerbation  and 3+ comorbidities: DM, CVA, Seizures are also affecting patient's functional outcome.   REHAB POTENTIAL: Good  CLINICAL DECISION MAKING: Evolving/moderate complexity  EVALUATION COMPLEXITY: Moderate  PLAN:  PT FREQUENCY: 1-2x/week  PT DURATION: 12 weeks  PLANNED INTERVENTIONS: 97110-Therapeutic exercises, 97530- Therapeutic activity, 97112- Neuromuscular re-education, 97535- Self Care, [id number]- Manual therapy, and Patient/Family education  PLAN FOR NEXT SESSION:  Continue with Standing frame Resume standing as able  Attempt standing frame if appropriate again Nustep for UE/LE strengthening Mat table dynamic sitting activities.    Reyes LOISE London, PT 02/08/2024, 3:34 PM

## 2024-02-12 ENCOUNTER — Ambulatory Visit

## 2024-02-12 ENCOUNTER — Ambulatory Visit: Admitting: Occupational Therapy

## 2024-02-12 DIAGNOSIS — M6281 Muscle weakness (generalized): Secondary | ICD-10-CM

## 2024-02-12 DIAGNOSIS — R2681 Unsteadiness on feet: Secondary | ICD-10-CM

## 2024-02-12 DIAGNOSIS — R278 Other lack of coordination: Secondary | ICD-10-CM

## 2024-02-12 DIAGNOSIS — H543 Unqualified visual loss, both eyes: Secondary | ICD-10-CM

## 2024-02-12 DIAGNOSIS — R41841 Cognitive communication deficit: Secondary | ICD-10-CM

## 2024-02-12 DIAGNOSIS — I6930 Unspecified sequelae of cerebral infarction: Secondary | ICD-10-CM | POA: Diagnosis not present

## 2024-02-12 DIAGNOSIS — R269 Unspecified abnormalities of gait and mobility: Secondary | ICD-10-CM | POA: Diagnosis not present

## 2024-02-12 DIAGNOSIS — R262 Difficulty in walking, not elsewhere classified: Secondary | ICD-10-CM

## 2024-02-12 DIAGNOSIS — R2689 Other abnormalities of gait and mobility: Secondary | ICD-10-CM

## 2024-02-12 NOTE — Therapy (Signed)
 OUTPATIENT PHYSICAL THERAPY NEURO TREATMENT  Patient Name: Paul Hall MRN: [id number] DOB:12/22/1995, 28 y.o., male Today's Date: 02/13/2024   PCP:  Paul Sous, PA-C  REFERRING PROVIDER: Morene Sous, PA-C  END OF SESSION:  PT End of Session - 02/12/24 0810     Visit Number 182    Number of Visits 200   date corrected from most recent recert   Date for Recertification  03/18/24    Authorization Type [employer] [place] [employer]    Authorization Time Period 01/04/21-03/29/21; Recert 03/24/2021-06/16/2021; Recert 09/15/2021- 12/08/2021; Recert 12/13/2021-03/07/2022    Progress Note Due on Visit 190    PT Start Time 1530    PT Stop Time 1614    PT Time Calculation (min) 44 min    Equipment Utilized During Treatment Gait belt    Activity Tolerance Patient tolerated treatment well    Behavior During Therapy WFL for tasks assessed/performed             Past Medical History:  Diagnosis Date   Diabetes mellitus (HCC)    Hypertension    Stroke [facility])    Past Surgical History:  Procedure Laterality Date   IVC FILTER PLACEMENT ([facility] HX)     LEG SURGERY     PEG TUBE PLACEMENT     TRACHEOSTOMY     Patient Active Problem List   Diagnosis Date Noted   Sepsis due to vancomycin  resistant Enterococcus species (HCC) 06/06/2019   SIRS (systemic inflammatory response syndrome) (HCC) 06/05/2019   Acute lower UTI 06/05/2019   VRE (vancomycin -resistant Enterococci) infection 06/05/2019   Anemia 06/05/2019   Skin ulcer of sacrum with necrosis of muscle (HCC)    Urinary retention    Type 2 diabetes mellitus without complication, with long-term current use of insulin  (HCC)    Tachycardia    Lower extremity edema    Acute metabolic encephalopathy    Obstructive sleep apnea    Morbid obesity with BMI of 60.0-69.9, adult (HCC)    Goals of care, counseling/discussion    Palliative care encounter    Sepsis (HCC) 04/27/2019   H/O insulin  dependent diabetes mellitus 04/27/2019    History of CVA with residual deficit 04/27/2019   Seizure disorder (HCC) 04/27/2019   Decubitus ulcer of sacral region, stage 4 (HCC) 04/27/2019    ONSET DATE: October 2020  REFERRING DIAG: Cerebral infarction, unspecified   THERAPY DIAG:  Muscle weakness (generalized)  Other lack of coordination  Difficulty in walking, not elsewhere classified  History of CVA with residual deficit  Unsteadiness on feet  Other abnormalities of gait and mobility  Abnormality of gait and mobility  Low vision, both eyes  Cognitive communication deficit  Rationale for Evaluation and Treatment: Rehabilitation  SUBJECTIVE:  SUBJECTIVE STATEMENT: Patient reports feeling very cold today and rushing to get in. States his ankles were very sore after last visit.  Pt accompanied by: family member  PERTINENT HISTORY: Paul Hall is a 28 y/o male who presents with severe weakness, quadriparesis, altered sensorium, and visual impairment s/p critical illness and prolonged hospitalization. Pt hospitalized in October 2020 with ARDS 2/2 COVID19 infection. Pt sustained a complex and lengthy hospitalization which included tracheostomy, prolonged sedation, ECMO. In this period pt sustained CVA and SDH. Pt has now been liberated from tracheostomy and G-tube. Pt has since been hospitalized for wound infection and UTI. Pt lives with parents at home, has hospital bed and left chair, hoyer lift transfers, and power WC for mobility needs. Pt needs heavy physical assistance with ADL 2/2 BUE contractures and motor dysfunction   PAIN:  Are you having pain? No  PRECAUTIONS: Fall  RED FLAGS: None   WEIGHT BEARING RESTRICTIONS: No  FALLS: Has patient fallen in last 6 months? No  LIVING ENVIRONMENT: Lives with: lives with  their family Lives in: House/apartment Stairs:  Has following equipment at home: Wheelchair (power) and hospital bed, hoyer lift, Sit to stand lift  PLOF: Independent  PATIENT GOALS: I want to walk  OBJECTIVE:  Note: Objective measures were completed at Evaluation unless otherwise noted.  DIAGNOSTIC FINDINGS: CLINICAL DATA:  Altered mental status.  History of COVID and stroke.   EXAM: CT HEAD WITHOUT CONTRAST   TECHNIQUE: Contiguous axial images were obtained from the base of the skull through the vertex without intravenous contrast.   COMPARISON:  CT head 11/11/2012   FINDINGS: Brain: Left cerebral convexity cortical hypodensity with associated cortical calcification extending from the frontal to the parietal lobe. Extensive area of volume loss and low-density compatible with chronic infarction. This is noted on prior reports from [facility]. Probable chronic watershed infarct. Additional small areas of hypodensity in the right frontal white matter consistent with chronic ischemia.   Negative for acute infarct. No acute hemorrhage or mass. Ventricle size normal without midline shift.   Vascular: Normal arterial flow voids.   Skull: Negative   Sinuses/Orbits: Mucosal edema paranasal sinuses with air-fluid level left sphenoid sinus. Negative orbit   Other: None   IMPRESSION: Extensive watershed type infarct over the left cerebral convexity with cortical calcification. This appears chronic. No associated hemorrhage, hydrocephalus, or midline shift.   These results were called by telephone at the time of interpretation on 05/09/2019 at 3:12 pm to provider Paul Hall , who verbally acknowledged these results.     Electronically Signed   By: Paul Hall M.D.   On: 05/09/2019 15:13  COGNITION: Overall cognitive status: Impaired   SENSATION: Light touch: WFL  COORDINATION: Delayed with BLE  EDEMA:  None observed  MUSCLE TONE: RLE: Moderate  POSTURE:  rounded shoulders and forward head  LOWER EXTREMITY ROM:     Active  Right Eval Left Eval  Hip flexion    Hip extension    Hip abduction    Hip adduction    Hip internal rotation    Hip external rotation    Knee flexion    Knee extension    Ankle dorsiflexion    Ankle plantarflexion    Ankle inversion    Ankle eversion     (Blank rows = not tested)  LOWER EXTREMITY MMT:    MMT Right Eval Left Eval  Hip flexion    Hip extension    Hip abduction    Hip adduction  Hip internal rotation    Hip external rotation    Knee flexion    Knee extension    Ankle dorsiflexion    Ankle plantarflexion    Ankle inversion    Ankle eversion    (Blank rows = not tested)  BED MOBILITY:  Findings: Rolling to Right Max A Rolling to Left Max A  TRANSFERS: Sit to stand: Max A x 2 - just able to clear gluteals off mat  Assistive device utilized: HHA x2 to stand- dependent on hoyer for actual transfers     Stand to sit: Max A  Assistive device utilized: HHA x 2        GAIT: Findings: Non-Ambulatory  FUNCTIONAL TESTS:  None appropriate at this time  PATIENT SURVEYS:                                                                                                                                TREATMENT DATE: 02/07/2024   TA: Deitra transfer from power w/c to [facility]- dependent on PT and mother to position onto mat then max assist to pull into sitting at [facility].   Sitting at [facility] Ant/poster weight shift x 30- progressive increase in ROM with increased reps.  Sitting at [facility]- Lateral weight shift x 30 - progressive increase in ROM- Reaching limits of stability Static sitting with attempting to place B hands on edge of mat x several min.   Therex: Resistive Hip IR/ER sitting with RTB 2 x 10 reps  Resistive ham curls RTB 2 x 10  AROM hip march 2 x 10 reps  Self/care:  Went outside with patient and mother to observe his transfers/set up with new fleeta. Mother able to independently  maneuver patient in his W/c into van from side and position straps accordingly.   PATIENT EDUCATION: Education details: Exercise technique; Plan for recert today Person educated: Patient and Parent Education method: Explanation Education comprehension: verbalized understanding  HOME EXERCISE PROGRAM: Access Code: [email or web address] URL: [email or web address] Date: 03/23/2022 Prepared by: Reyes London   Exercises - Supine Bridge  - 3 x weekly - 3 sets - 10 reps - 2 hold - Supine Gluteal Sets  - 3 x weekly - 3 sets - 10 reps - 5 sec hold - Supine Quad Set  - 1 x daily - 3 x weekly - 3 sets - 10 reps - 5 hold - Seated Long Arc Quad  - 1 x daily - 7 x weekly - 3 sets - 10 reps - Seated Hip Adduction Squeeze with Ball  - 1 x daily - 3 x weekly - 3 sets - 10 reps - 5 hold - Seated Hip Abduction  - 1 x daily - 3 x weekly - 3 sets - 10 reps - 2 hold  GOALS: Goals reviewed with patient? Yes  SHORT TERM GOALS: Target date: 02/05/2024  Patient will be independent with updated HEP for improved overall LE strength in home Baseline:  HEP continues to be updated as appropriate.  Goal status: NEW  LONG TERM GOALS: Target date: 03/18/2024 -Goal were updated/revised secondary to length of chart.   Patient will perform sit to stand transfer with appropriate AD and max assist of 2 people with 75% consistency to prepare for pregait activities.  Baseline: 09/04/2023- Patient performed several Sit to stand requiring max A+2  with improving posture- able to ext R knee better - near terminal knee and able to clear bottom off mat- still not full but able to contract glutes for standing as erect as possible.  Goal status: ONGOING  2.  Patient will tolerate 2 minutes or more of standing in sit to stand lift or with max assist +2 in order to indicate improved lower extremity weightbearing tolerance for progression to standing in parallel bars  Baseline:  09/04/2023- Patient performed 3 trials of  standing ranging from 17 -47 sec - improving right knee terminal knee ext and more activation upon command with gluteals- able to clear mat table. Continued to require Max A+2 and blocking knees with forearm/trunk support.   Goal status: ONGOING  3.  Patient will perform rolling left to right with Mod assist for improved independence with bed mobility to assist with dressing and positioning in bed.  Baseline: Dependent on caregiver to roll to left; 12/27/2023= Mod to max assist each direction (difficult due to RUE tone)  Goal status: Ongoing  4.  Patient will perform active bridging -able to clear bottom   Off mat for 10 reps with PT/caregiver holding his feet for improved trunk ext strength with standing and to assist with mobility/positioning.  Baseline: Unable to perform 10 reps with consistent clearing of bottom; 12/27/2023 = Patient able to minimally clear his glues off mat table - but successfully 10 reps today.  Goal status: MET  5. Patient will perform active bridging -able to clear bottom   Off mat for 3 sets of 10 reps with PT/caregiver holding his feet for improved trunk ext strength with standing and to assist with mobility/positioning.  Baseline: 12/27/2023- Patient able to complete 10 reps with max effort  Goal status: New     ASSESSMENT:  CLINICAL IMPRESSION: Patient was limited today - complaining of feeling cold throughout session and unable to warm up despite exercises. He did exhibit increased confidence with trunk mobility and more active RLE strength with hip vs. Some previous sessions. Still overall limited with more guarded appearance today- did not attempt standing frame. Pt will continue to benefit from skilled physical therapy intervention to address impairments, improve QOL, and attain therapy goals.   OBJECTIVE IMPAIRMENTS: Abnormal gait, cardiopulmonary status limiting activity, decreased activity tolerance, decreased balance, decreased cognition, decreased  coordination, decreased endurance, decreased knowledge of condition, decreased knowledge of use of DME, decreased mobility, difficulty walking, decreased ROM, decreased strength, hypomobility, impaired flexibility, impaired sensation, impaired UE functional use, impaired vision/preception, postural dysfunction, obesity, and pain.   ACTIVITY LIMITATIONS: carrying, lifting, bending, sitting, standing, squatting, stairs, transfers, bed mobility, bathing, toileting, dressing, self feeding, reach over head, and hygiene/grooming  PARTICIPATION LIMITATIONS: meal prep, cleaning, laundry, medication management, personal finances, interpersonal relationship, driving, shopping, community activity, occupation, yard work, and church  PERSONAL FACTORS: Time since onset of injury/illness/exacerbation and 3+ comorbidities: DM, CVA, Seizures are also affecting patient's functional outcome.   REHAB POTENTIAL: Good  CLINICAL DECISION MAKING: Evolving/moderate complexity  EVALUATION COMPLEXITY: Moderate  PLAN:  PT FREQUENCY: 1-2x/week  PT DURATION: 12 weeks  PLANNED INTERVENTIONS: 97110-Therapeutic exercises, 97530- Therapeutic  activity, [id number]- Neuromuscular re-education, [phone]- Self Care, [id number]- Manual therapy, and Patient/Family education  PLAN FOR NEXT SESSION:  Continue with Standing frame Resume standing as able  Attempt standing frame if appropriate again Nustep for UE/LE strengthening Mat table dynamic sitting activities.    Reyes LOISE London, PT 02/13/2024, 8:34 AM

## 2024-02-12 NOTE — Therapy (Addendum)
 . Occupational Therapy Neuro Treatment Note    Patient Name: Paul Hall MRN: [id number] DOB:[birth year], 28 y.o., male Today's Date: 02/12/2024  PCP: Dr. Bertell REFERRING PROVIDER: Dr. Bertell   .   OT End of Session -     Visit Number 116   Number of Visits 180    Date for Recertification  03/20/24    Authorization Type    OT Start Time 1620   OT Stop Time 1700   OT Time Calculation (min) 40 min    Equipment Utilized During Treatment tilt in space power wc    Activity Tolerance Patient tolerated treatment well    Behavior During Therapy WFL for tasks assessed/performed                  Past Medical History:  Diagnosis Date   Diabetes mellitus (HCC)    Hypertension    Stroke [facility])    Past Surgical History:  Procedure Laterality Date   IVC FILTER PLACEMENT ([facility] HX)     LEG SURGERY     PEG TUBE PLACEMENT     TRACHEOSTOMY     Patient Active Problem List   Diagnosis Date Noted   Sepsis due to vancomycin  resistant Enterococcus species (HCC) 06/06/2019   SIRS (systemic inflammatory response syndrome) (HCC) 06/05/2019   Acute lower UTI 06/05/2019   VRE (vancomycin -resistant Enterococci) infection 06/05/2019   Anemia 06/05/2019   Skin ulcer of sacrum with necrosis of muscle (HCC)    Urinary retention    Type 2 diabetes mellitus without complication, with long-term current use of insulin  (HCC)    Tachycardia    Lower extremity edema    Acute metabolic encephalopathy    Obstructive sleep apnea    Morbid obesity with BMI of 60.0-69.9, adult (HCC)    Goals of care, counseling/discussion    Palliative care encounter    Sepsis (HCC) 04/27/2019   H/O insulin  dependent diabetes mellitus 04/27/2019   History of CVA with residual deficit 04/27/2019   Seizure disorder (HCC) 04/27/2019   Decubitus ulcer of sacral region, stage 4 (HCC) 04/27/2019   ONSET DATE: 01/2019  REFERRING DIAG: CVA/COVID-19  THERAPY DIAG:  Muscle weakness  (generalized)  Rationale for Evaluation and Treatment Rehabilitation  SUBJECTIVE:    SUBJECTIVE STATEMENT:   Pt. Reports being cold this afternoon, however the heat has helped.   Pt accompanied by: self and family member  PERTINENT HISTORY:  Pt. is a 28 y.o. male who was diagnosed with COVID-19, and CVA with resultant quadriplegia. Pt. Was then hospitalized with VRE UTI. PMHx includes: urinary retention, seizure disorder, obstructive sleep disorder, DM Type II, Morbid obesity.   PRECAUTIONS: None  WEIGHT BEARING RESTRICTIONS No  PAIN:  No reports of pain Are you having pain? 0/10; UE tightness discomfort  FALLS: Has patient fallen in last 6 months? No  LIVING ENVIRONMENT: Lives with: lives with their family Lives in: House/apartment Stairs: No Level Entry Has following equipment at home: Wheelchair (power) and hoyer lift, sit to stand lift  PLOF: Independent  PATIENT GOALS: To be able to engage in more daily care tasks.  OBJECTIVE:   HAND DOMINANCE: Right  ADLs:  Transfers/ambulation related to ADLs: Eating: Pt. reports being able to hold standard utensils, and is starting to engage more in self-feeding tasks, hand to mouth patterns. Pt. Reports that he does as much as he can with the task, and family assists  with the remainder of the task. Grooming: Pt. is able to initiate holding an  electric toothbrush, and brush his teeth. Family assists LB Dressing: Total Assist UE dressing: Pt. is now able to reach up to actively assist with grasping , and pulling his gown down. Toileting:  Total Assist Bathing: MaxA UB, Total assist LB Tub Shower transfers: N/A Equipment: See above    IADLs: Shopping: Relies on family to assist Light housekeeping: Total Assist Meal Prep: Total Assist Community mobility:   Medication management:  Total Assist  Financial management: N/A Handwriting: Not legible: Pt. Is able to hold a pen with the left hand, and initiate marking the page.  Pt.'s eye glasses were not available  MOBILITY STATUS:  Power w/c  POSTURE COMMENTS:  Pt. Requires position changes in his power w/c  ACTIVITY TOLERANCE: Activity tolerance:  Fair  FUNCTIONAL OUTCOME MEASURES: FOTO: 40 TR score: 45  05/25/2022:   FOTO: 50 TR score: 45  07/20/22: FOTO 55  03/08/23: 51  UPPER EXTREMITY ROM     Active ROM Left eval Left  03/21/2022 Left 05/25/2022 L  07/20/22 Left 07/25/2022 Left  08/15/2022 Left  09/26/2022 Left  11/16/2022 Left 12/28/2022 Left 03/13/23 Left 05/10/23 Left 10/23/23 Left 12/27/23  Shoulder flexion 119 123 125 130  136 138 130 142 144 144 148 148  Shoulder abduction 110 115 115 115  118 121 125 142 140 140 140 140  Shoulder adduction               Shoulder extension               Shoulder internal rotation               Shoulder external rotation               Elbow flexion 135(135) 145 145 145  145    145 145  145  Elbow extension -27(-18) -21(-20) -20(-16)  -25(-10) -23(-10) -21(-10) -20(-18) -20(-18) -20(-17) -17(-16) -12(-8) -12(-6)  Wrist flexion               Wrist extension 10(50) 19(50) 28(50)  30(50) 35(55) 36(55) 36(60) 40(60) 45(60) 45(60) 42(54) 36(54)  Wrist ulnar deviation               Wrist radial deviation               Wrist pronation            60(74) 84(84)  Wrist supination   Limited by flexor tone       10(15)  30(38) 20(38)  (Blank rows = not tested)    Active ROM Right eval Right 03/21/2022 Right 05/25/2022 R  07/20/22 Right  07/25/22 Right 08/15/2022 Right 09/26/2022 Right 11/16/2022 Right 12/28/2022 Right 03/13/23 Right 05/10/23 Right 10/23/23 Right  9/24 /25  Shoulder flexion 106 scaption 105  Scaption 62 flexion 110 scaption 100  105 105 105 112 Scaption 123 Scaption 122  Scaption 142 Scaption 142  Shoulder abduction 114 90 97 100  105 117 120 124 124 122 144 146  Shoulder adduction               Shoulder extension               Shoulder internal rotation               Shoulder external  rotation               Elbow flexion 120(130) 145 145 140  144 140 142 148 148 145  145  Elbow extension -45(-35) -22(-35) -  28(-22)  -32(-21) -32(-28) -28(-26) -28(-28) -27(-26) -27(-40) -38(-35) -34(-26) -34(-26)  Wrist flexion               Wrist extension -30(10) -25(10) -10(30)  -10(20) -10(25) -20(40) -30(10) -22(34) -22(35) -32(22) -30(-8) -38(-6)  Wrist ulnar deviation               Wrist radial deviation               Wrist pronation               Wrist supination   Limited by flexor tone                 12/28/2022: Thumb radial abduction: Right: 12(46), Left: 40(54)  Digits: Bilateral 2nd through 5th digit: MP Hyperextension with PIP, an DIP flexion    10/23/23:  Bilateral 2nd through 5th digit MP hyperextension with PIP, and DIP flexion: Pt. has been intermittently able to initate active diigt PIP, and DIP extension following Botox during this past recertification/progress reporting period.     UPPER EXTREMITY MMT:     Left eval Left 03/21/2022 Left  05/25/2022 L 07/20/22 Left  08/15/2022 Left 09/26/2022 Left 11/16/2022 Left 12/28/2022 Left  03/13/2023 Left  05/10/23 Left 10/23/23 Left 12/27/23  Shoulder flexion 3-/5 3/5 3+/5 4+/5 4+/5 4+/5 4+/5 4+/5 4+/5 4+/5 4+/5 4+/5  Shoulder abduction 3-/5 3-/5 4-/5 4+/5 4+/5 4+5 4+/5 4+/5 4+/5 4+/5 4+/5 4+/5  Shoulder adduction              Shoulder extension              Shoulder internal rotation              Shoulder external rotation              Elbow flexion 3/5 3+/5 4-/5 4+/5 5/5 5/5 5/5 5/5 5/5 5/5 5/5 5/5  Elbow extension 2-/5 2/5  4+/5 4+/5 4+/5  4/5 4/5 4/5 through available range 4+/5 through available range 4+/5  Wrist flexion              Wrist extension 2/5 2+/5 3-/5  3-/5 3-/5 3-/5 3/5 3/5 3/5 3+/5 3-/5  Wrist ulnar deviation              Wrist radial deviation              Wrist pronation              Wrist supination              (Blank rows = not tested)  Right eval Right 03/21/2022 Right  05/25/2022  R 07/20/22 Right 08/15/2022 Right 09/26/2022 Right 11/16/2022 Right 12/28/2022 Right 03/13/2023 Right 05/10/23 Right 10/23/23 Right 12/27/23  Shoulder flexion 3-/5 scaption 3-/5 3-/5 3+/5 3+/5 3+/5 3/5 3/5 3/5 3+/5 4-/5 4/5  Shoulder abduction 3-/5 3-/5 3-/5 4/5 4/5 4/5 4/5 4/5 4/5 4/-5 4-/5 4/5  Shoulder adduction              Shoulder extension              Shoulder internal rotation              Shoulder external rotation              Elbow flexion 3/5 3/5 3+/5 4/5 4+/5 4+/5 4+/5 4+/5 4+/5 4+/5 4+/5 5/5  Elbow extension 2-/5 2/5 3-/5 4+/5 4+/5 4+/5 4+/5 4-/5 4-/5 4-/5 4-/5 through available range 3-/5  Wrist flexion  Wrist extension 2-/5 2-/5 2/5 2/5 2/5 2/5 2/5 2/5 2/5 2/5 2/5 2-/5  Wrist ulnar deviation              Wrist radial deviation              Wrist pronation              Wrist supination                HAND FUNCTION:  Grip strength: Right: 0 lbs; Left: 0 lbs and Lateral pinch: Right: 5 lbs, Left: 2 lbs  03/21/2022: Lateral pinch: Right: 3.5 lbs, Left: 2 lbs  07/20/22: Grip strength: Right: 3 lbs; Left: 4 lbs and Lateral pinch: Right: 5 lbs, Left: 3 lbs  08/15/2022:  Grip strength: Right: 0 lbs; Left: 5 lbs and Lateral pinch: Right: 2 lbs, Left: 4 lbs  09/26/2022  Grip strength: Right: 0 lbs; Left: 8 lbs and Lateral pinch: Right: 2 lbs, Left: 5 lbs  11/16/2022  Grip strength: Right: 0 lbs; Left: 8 lbs  9/25/20204  Grip strength: Right: 0 lbs; Left: 8 lbs  Grip strength: Right: 0 lbs; Left: 8 lbs   03/08/23:  Grip strength: Right: 0 lbs without PROM, 5 lbs after PROM, Left 8 lbs  05/10/23:  Grip strength: TBD  Bilateral digit PIP/DIP flexion contractures with MP hyperextension with attempts for AROM. Pt. is able to tolerate AROM to the bilateral digits at the initial evaluation however, has a history of pain in the digits.  COORDINATION: Eval: Pt. is unable to grasp 9-hole test pegs. Pt. is able to initiate grasping larger pegs, and is able to  hold a pen in the left hand.  07/20/22: 2 min 36 seconds to remove 9 pegs from 9 hole peg test - cues to locate pegs 2/2 low vision. Pt. is able to initiate grasping larger pegs on R hand and is able to hold a pen in the left hand.  08/15/2022:    Vision, and sensation limiting accuracy of 9 hole peg test results. Pt. Was able to grasp and remove vertical pegs intermittently with cues.    05/10/23: TBD  SENSATION: Light touch: Impaired   EDEMA:  N/A  MUSCLE TONE: BUE flexor Spasticity  COGNITION Overall cognitive status: Continue to assess in functional context  VISION:   Subjective report: Pt. was not wearing glasses at the time of the initial eval.  Baseline vision: Vision is very limited. Wears glasses all the time Visual history: History of impaired vision following CVA. Pt. Has received treatment through the [facility] low vision rehabilitation program.   VISION ASSESSMENT: Impaired To be further assessed in functional context  PERCEPTION: Impaired   PRAXIS: Impaired: motor planning  OBSERVATIONS:  Pt reports being on Tramadol   TODAY'S TREATMENT 02/12/24   Moist heat modality to the RUE, and bilateral hands in conjunction with ROM  Therapeutic Ex.:    -BUE tolerated PROM was performed with emphasis placed on slow prolonged gentle stretching of distal RUE for elbow extension, and forearm supination, bilateral hands including: wrist/digit flexion/extension including MCPs, PIPs, DIPs of all digits, and wrist flexion/extension, and forearm supination in conjunction with moist heat modality 2/2 flexor tone, and tightness.   Therapeutic Activities:   -Pt. worked on [facility] skills/bimanual hand tasks sliding 1.5 flat washers off of an elevated surface with his 2nd digit to his thumb in preparation for grasping it, and transferring it to the opposite hand from one hand to the other.  -Pt.  Worked with both the left and right hands with emphasis placed on gross digit  extension.    PATIENT EDUCATION: Education details: ROM, BUE tone  Person educated: Patient and Parent Education method: Explanation Education comprehension: verbalized understanding  HOME EXERCISE PROGRAM:    Incorporating  the right hand during self-feeding tasks.   GOALS: Goals reviewed with patient? Yes  SHORT TERM GOALS: Target date: 12/05/2023   To assess splint fit, and make appropriate adjustments to promote good skin integrity through the palmar surface of the bilateral hands.  Baseline: 05/25/22: Goal currently met, however ongoing as needs to assess splint fit arise. 03/23/2022: Pt. is wearing splints a couple of hours at night bilateral resting hand splints. 03/21/2022: Pt. is wearing splints a couple of hours at night bilateral resting hand splints. Goal status: Deferred   LONG TERM GOALS: Target date: 03/20/2024   FOTO score will Improve by 2 points for Pt. perceived improvement with the assessment specific ADL/IADL tasks.  Baseline: 03/08/23: 51; 12/28/2022: FOTO score: 56; 11/16/2022: 52 09/27/2022: 51 07/20/2022: FOTO 55 05/25/2022: FOTO score: 50,  TR score: 45 Eval: FOTO score: 40, TR score: 45 Goal status: Achieved   2.   Pt. will independently perform oral care for 100% of the task after complete set-up. Baseline: 05/10/23: Pt. Performs 90% of the task. Pt.'s mother assists with 10% of the task for thorough cleaning the hard to reach places way in the back of the oral cavity.03/08/23: not as much assist required proximally, but to adjust positioning of toothbrush; 02/13/23: Pt. Continues to require assist proximally 01/09/2023: Continue 12/28/2022:  Pt. Continues to complete 90% of the task with complete set-up, with visual cues, and occassional assist of the right elbow. 11/16/2022: Pt. Completes 90% of the task with complete set-up. 09/26/2022: Completes 90% of the task, proximal assist at times required at the elbow.  08/15/2022: Completes 90% of the task, proximal assist at  times required at the elbow. 07/20/22: completes 90% of task, limited by shoulder flexion. 05/25/2022:  Pt. Is able to initiate and perform oral care for approximately 90% of the task. Complete set-up required. Assi needed only for the very back teeth. 03/23/2022: Pt. Is able to initiate and perform oral care for approximately 75% of the task. Pt. Requires assist at proximally at the elbow for through oral care. 03/21/2022: Pt. Is able to initiate and perform oral care for approximately 75% of the task. Pt. Requires assist at proximally at the elbow for through oral care. Eval: Pt. is able to initiate using an electric toothbrush. Pt. requires assist for set-up, and assist for thoroughness, and as he Pt. fatigues. Goal status:  Partially met, Goal discontinued  3.  Pt. Will be modified independence with self-feeding for 100% using a swivel spoon, and standard fork after complete set-up Baseline: 05/10/2023: Pt. feeds self 95-100% of the meal. with occasional assist to reposition the utensil. 03/08/23: indep with cup with a handle and lid, assist to reposition eating utensils in hand;  02/13/23:  Pt. Is now able to consistently, and efficiently use a swivel spoon for eating cereal with the left hand.01/09/2023: Continue 12/28/2022: Pt. Is now feeding himself 90% of the time using a swivel spoon with the left hand, and 95% of the time with the fork. Pt. Continues to have difficulty with cutting food. 11/16/2022: 100% with finger foods, Pt. Is initiating with a swivel spoon, and universal cuff for fork use, however requires assist for consistency, and accuracy.  09/26/2022: 90% using the  spoon, assist at time with the forearm motion, and grip on the utensils. 100% for finger foods.  08/15/2022:  90% using the spoon, assit at time with the forearm motion, and grip on the utensils. 100% for finger foods-independent with set-up- unsupervised.  07/20/22: self-feeds cereal using spoon 90% of task. 05/25/2022: Pt. Is able to use a  spoon to scoop cereal when feeding himself cereal 85% of the time. Pt. Is able to feed himself snack/finger foods 100% of the time. Pt. Continues to work on consistency of  stabilizing a cup/mug when drinking. Pt. Is able to grasp a water  bottle with assist initially, with assist tapering off as he drinks.03/23/2022: Pt. Is able to perform scooping cereal for 75% of the time. Pt. required assist, and support at the left elbow, and Pt. Presents with limited forearm supination when using the spoon, and bringing it towards his mouth. Pt. Is able to use a fork to spear items, and perform the hand to mouth pattern.  03/21/2022: Pt. Is able to perform scooping cereal for 75% of the time. Pt. required assist, and support at the left elbow, and Pt. presents with limited forearm supination when using the spoon, and bringing it towards his mouth. Pt. Is able to use a fork to spear items, and perform the hand to mouth pattern.  Eval: Pt. is able to hold standard standard utensils. Pt. Performs as much of the task as he, can and has assistance for the remainder. Goal status:  Improved, Achieved  4.  Pt. will improve grasp patterns and consistently grasp 1/4 objects for ADL, and IADL tasks.  Baseline: 01/08/24: Continue, carry goal over from recertification update last session. 12/27/23: Pt. is able to present with increased tightness  2/2 being way overdue for Botox injections, which limits consistency with grasping small objects. 10/23/2023: Pt. is able to initiate grasping small objects visual cues, and increased time, however continues to work towards grasping 1/4 objects efficiently. 08/27/23: Pt. is able to grasp flat discs, and sustain a secure hold on them while moving the UE through various planes. 08/07/23: Continue 05/31/2023: Continue 05/10/23: Pt. is consistently able to grasp 1/2 objects with visual cues. Pt. Requires assist, and increased visual cues to grasp 1/4 objects on an elevated plane.  Pt. Is unable to  grasp the flat objects at the tabletop surface. Pt. Is grasping grasping small puppy vitamins.03/08/23: 1/2 objects efficiently; 02/13/2023: 1/2 objects efficiently 01/09/2023: Continue 12/28/2022: Continues to grasp 1/4 pegs with min a + visual cues, consistently grasping 1/2 objects with visual cues 11/16/2022: Continues to grasp 1/4 pegs with min a + visual cues, consistently grasping 1/2 objects with visual cues 09/26/2022: Continues to grasp 1/4 pegs with min a + visual cues, consistently grasping 1/2 objects with visual cues. 08/15/2022: grasps 1/4 pegs with min a + visual cues, consistently grasping 1/2 objects with visual cues. 07/20/22: grasps 1/4 pegs with min a + visual cues, consistently grasping 1/2 objects with visual cues. 05/25/2022: Pt. Is working on improving consistency of grasping 1/2 objects with visual cues.  03/23/2022: Pt. Is able to grasp 1 objects consistently,and continues to work on the hand patterns needed to grasp 1/2 objects.03/21/2022: Pt. Is able to grasp 1 objects consistently,and continues to work on the hand patterns needed to grasp 1/2 objects. Eval: Pt. is able to grasp 1 objects intermittently using a lateral grasp pattern. Goal status: Ongoing  5.  Pt. will independently write his name legibly with letter sizes under 1. Baseline: 05/10/23: Deferred  03/08/23: Not addressed this period; 02/13/2023: Continue 01/09/2023: Continue. 12/28/2022: Pt. is now using an IPad Pen. 11/16/2022: Pt. Continues to be able to write name with smaller letter size. Pt. Is signing name with a computer styus. Pt. Requires visual cues, and assist 09/26/2022: Pt. Continues to be able to write name with smaller letter size. Pt. Is signing name with a computer styus. Pt. Requires visual cues, and assist. 08/15/2022: Pt. Is able to write name with smaller letter size. Pt. Is signing name with a computer styus. Pt. Requires visual cues, and assist. 07/20/22: stabilizing assist to write name with  75% legibility with 2 letters. 05/25/2022: Pt. to continue to work towards formulating grasp patterns in preparation for grasping a large width pen. Pt. Requires visual cues. 03/23/2022: Pt. Is able to write his name with modA, however has difficulty with formulating letter sizes less than 2 in size with 50% legibility for the 3 letters of his name.03/21/2022: Pt. Is able to write his name with modA, however has difficulty with formulating letter sizes less than 2 in size with 50% legibility for the 3 letters of his name. Eval: Pt is able to hold a thin marker with his left hand, and formulate a line, and initiate a circular pattern (Pt. without glasses today) Goal status: Deferred  6. Pt. Will reach up to comb/brush his hair with minA.  Baseline: 05/10/23: Pt. is able to use a pick independently with the left hand with occasional assist to the far side of the left his head. 03/08/23: Pt uses pick in L hand to reach 75% of his head; unable to reach R side of head; 02/13/2023:Pt. is able to use a hair pick with the left hand on the right side of his head, top, and back of the head. Pt. Continues to have difficulty reaching further to the right side of his head with the left. 01/09/2023: Continue 12/28/2022: Pt. Is progressing, and is now able to use a hair pick with the left hand on the right side of his head, top, and back of the head. Pt. Continues to have difficulty reaching further to the right side of his head with the left. 11/16/2022: Pt. Is able to use a hair pick for the left side of his head using his left hand. Pt. Presents with difficulty reaching to the right side of his head.09/26/2022: Pt. Is able to use a hair pick for the left side of his head using his left hand. Pt. Presents with difficulty reaching to the right side of his head. 5/13/202: 75% using a hair pick, assist to the right side of the head. 07/20/22: reaches 75% of head, assist for far R side of head, fatigues quickly. 05/25/2022: Pt. Is  able to reach up with the left hand to the left side, top, and back of his head. 03/23/2022: Pt. is now able to more consistently initiate reaching up to his head with his left hand in preparation for haircare12/18/2023: Pt. is now able to more consistently initiate reaching up to his head with his left hand in preparation for haircare. Eval: Pt. is able to initiate reaching up for hair care with a long handled brush, however is unable to sustain UE's in elevation to perform the task.     Goal status: Achieved  7. Pt. Will independently navigate the w/c through his environment with minA with visual scanning, and hand placement on the controls.  Baseline: 11/16/2022: Deferred 2/2 vision 09/26/2022: Pt. Is now navigating his w/c ouside  the home, and around his block. 08/15/2022: Pt. Requires minA to setup hand on controls and MIN cues to navigate the w/c in wide spaces, requires MIN - MOD cues to navigate the w/c through more narrow doorways, and tighter turns - varies based on good vs bad vision days.07/20/22: Pt. Requires minA to setup hand on controls and MIN cues to navigate the w/c in wide spaces, requires MIN - MOD cues to navigate the w/c through more narrow doorways, and tighter turns - varies based on good vs bad vision days. 05/25/2022: Pt. Requires minA  and  cues to navigate the w/c in wide spaces, and requires Mod cues to navigate the w/c through more narrow doorways, and tighter turns. Pt. Requires max cues for scanning through the environment, and moderate cues for hand placement on the controls.     Goal status: Deferred 2/2 vision    8. Pt. Will improve bilateral grip strength to be able to independently grasp, and pull up blankets, and linens.   Baseline: 05/10/23: Deferred 03/08/23: R grip 0-5 lbs, L grip 8 lbs; pt reports he can consistently pull up blankets and linens independently.  02/13/2023: Continue. Pt. presents with digit PIP, and DIP flexor tightness. 01/09/2023: Continue 12/28/2023: R:  0#, L: 5# Pt. Is now able to more consistently grasp and pull blankets up over him. 11/16/2022: R: 0 L: 8# Pt. Is more consistently grasping his blankets and attempting to pull them up. 09/26/2022: R: 0  L: 8# Pt. is attempting to grasp, and pull blankets up more, using mostly the left hand. 08/15/2022: Pt. has difficulty securely holding, and pulling up blankets, and linens.    Goal status:Deferred  9. Pt. will consistently actively control the releasing of blankets, covers, and linens from his hands once they are in the desired position over him. Baseline: 12/28/2022: Pt. Is consistently actively able to release linens once they are in the desired position over him. 11/16/2022: Pt. continues to improve with actively releasing blankets form his hands. 09/26/2022: Pt. is improving with actively releasing blankets form his hands. 08/15/2022: Pt. has difficulty with controlled releasing of blankets/linens from his grasp.     Goal status: Achieved    10. Pt. Will improve bilateral UE strength by 2 MM grades to assist with ADLs, and IADLs  Baseline: 01/08/24: Continue, carry goal over from recertification update last session. 12/27/23:  Right: shoulder flexion: 4/5, abduction: 4/5, elbow flexion: 5/5 , extension: 3-/5 wrist extension: 2-/5; Left: shoulder flexion: 4+/5, abduction: 4+/5, elbow flexion: 5/5 , extension: 4+/5  wrist extension: 3+/5  08/27/23:  Right: shoulder flexion: 3+/5, abduction: 4-/5, elbow flexion: 4+/5 , extension: 4-/5 wrist extension: 2/5; Left: shoulder flexion: 4+/5, abduction: 4+/5, elbow flexion: 5/5 , extension: 4+/5  wrist extension: 3-/5  10/23/2023: Right: shoulder flexion: 4-/5, abduction: 4-/5, elbow flexion: 4+/5 , extension: 4-/5 wrist extension: 2/5; Left: shoulder flexion: 4+/5, abduction: 4+/5, elbow flexion: 5/5 , extension: 4+/5  wrist extension: 3+/5  08/27/23:  Right: shoulder flexion: 3+/5, abduction: 4-/5, elbow flexion: 4+/5 , extension: 4-/5 wrist extension: 2/5; Left:  shoulder flexion: 4+/5, abduction: 4+/5, elbow flexion: 5/5 , extension: 4/5  wrist extension: 3/5  08/07/23: Continue 05/31/2023: Continue 05/10/23: Right: shoulder flexion: 3+/5, abduction: 4-/5, elbow flexion: 4+/5 , extension: 4-/5 wrist extension: 2/5; Left: shoulder flexion: 4+/5, abduction: 4+/5, elbow flexion: 5/5 , extension: 4/5  wrist extension: 3/5 03/13/2023: Right: shoulder flexion: 3/5, abduction: 4/5, elbow flexion: 4+/5 , extension: 4-/5 wrist extension: 2/5; Left: shoulder flexion: 4+/5, abduction: 4+/5,  elbow flexion: 5/5 , extension: 4/5  wrist extension: 3/5 03/08/23: NT this date d/t as pt arrived late; 02/13/2023: Continue 01/09/2023: Continue 12/28/2022: Right: shoulder flexion: 3/5, abduction: 4/5, elbow flexion: 4+/5 , extension: 4-/5 wrist extension: 2/5; Left: shoulder flexion: 4+/5, abduction: 4+/5, elbow flexion: 5/5 , extension: 4/5  wrist extension: 3/5 Right: shoulder flexion: 3+/5, abduction: 4/5, elbow flexion: 4+/5 , extension: 4+/5 wrist extension: 2/5; Left: shoulder flexion: 4+/5, abduction: 4+/5, elbow flexion: 5/5 , extension: 4+/5 wrist extension: 3-/5 11/16/2022: Right: shoulder flexion: 3/5, abduction: 4/5, elbow flexion: 4+/5 , extension: 4+/5 wrist extension: 2/5; Left: shoulder flexion: 4+/5, abduction: 4+/5, elbow flexion: 5/5 , extension:  wrist extension: 3-/5 Right: shoulder flexion: 3+/5, abduction: 4/5, elbow flexion: 4+/5 , extension: 4+/5 wrist extension: 2/5; Left: shoulder flexion: 4+/5, abduction: 4+/5, elbow flexion: 5/5 , extension: 4+/5 wrist extension: 3-/5 Goal status: Ongoing  11. Pt. will improve right wrist extension by 10 degrees to initiate reaching for items in preparation for ADLs. Baseline:01/08/24: Continue, carry goal over from recertification update last session.  12/27/23: Right wrist extension: -38(-6) 10/23/23: -30(-8) 08/23/2023: R Wrist Ext: -31(-8)  08/07/23: Continue 05/31/23: Continue 05/10/23: Right wrist extension: -32(22) 03/13/23:  -22(35) 03/08/23: NT this date as pt arrived late; 02/13/2023: Continue 01/09/2023: Continue 12/28/2022: -22(34) 11/16/2022: -30(10)    Goal status: Ongoing      12. Pt. Will require donn/doff a Tank top T-shirt efficiently with supervision and full set-up.    Baseline: 01/08/24: Continue, carry goal over from recertification update last session. 12/27/23: Independent doffing, MaxA doffing 10/23/23: Independent doff the shirt/tank, MaxA  donning. 10/23/23: Pt. Is able to independently doff his shirt, requiring assist to lean forward and loosen the back. 08/07/23: Continue 05/31/2023: Pt. Requires maxA donning a pullover sweatshirt, Mod-maxA doffing it. T-shirt tank TBD 05/10/23: Pt. Presents with difficulty completing, and requires increased time to complete    Goal status: Ongoing    13. Pt. Will independently perform bilateral/bimanual hand tasks needed to fold towels/linens/laundry Baseline: 01/08/24: Continue, carry goal over from recertification update last session. 12/27/23: Pt. Has not been able to work on the this  2/2 multiple cancellations this recertification 2/2 illness, familiy health issues,a nd transportation. 10/23/23: Pt. Continues to have difficulty folding laundry, and linens. 08/23/23: Pt. Continues to have difficulty using bilateral/bimanual hand tasks needed to fold towels/linens/laundry. 08/07/23: Continue 05/31/23: Continue 05/10/23:  Pt. Has difficulty folding towels/lines/laundry using bilateral hands Goal status:Ongoing  14. Pt. Will independently, and efficiently reach his right arm over the side of the chair to pet his dog.    Baseline: 08/23/2023: Pt. Is now able to reach over the side of the armrest of his recliner chair to pet his dog.  08/07/23: Continue 05/31/2023: Continue 05/10/23: Pt. is unable to efficiently reach his right UE over the side of his recliner chair to pet his dog.    Goal status: Achieved    15. Pt. Will independently, and efficiently reach out in front of him to  efficiently place items onto his table while sitting in his recliner.     Baseline: 01/08/24: Continue, carry goal over from recertification update last session. 12/27/2023: Pt. is able to reach out to efficiently reach out in front of him to place items on the table 90% of the time with the LUE, 10% of the time with the RUE 2/2  increased flexor tone. 10/23/23: Pt. Is improving with functional reaching with the left UE out in front of him to place items onto the table. Pt.  has difficulty reaching out with the right hand to  place items on the table. 08/23/2023: Pt. has difficulty consistently reaching out in front of him to place items on the on a tabletop surface with the right hand. 08/07/23: Continue 2/26/225: Continue 05/10/23: Pt. has difficulty reaching out in front of him far enough to efficiently place items on the table while sitting in a recliner chair.    Goal status: Continue/Ongoing     16. Pt. Will improve bilateral hand supination by 10 degrees to be able actively turn items in his hand to look at them independently and efficiently during ADLs/IADLs,    Baseline: 01/08/24: Continue, carry goal over from recertification update last session. 12/27/23: Right: Unable 2/2 increased tightness  Left: supination 20(38) 10/23/23: Right: unable, L: supination 30(38)    Goal status: Continue/Ongoing      17. Pt. Will independently, and efficiently transition between picking up finger foods for dipping, and  picking up utensils , then placing them down on the tabletop surface through 75% of the meal with minimal cues.    Baseline: 01/08/24: Continue, carry goal over from recertification update last session. 12/27/23: Pt. is able to consistently transition between picking up finger foods for dipping, and utensils consistently with the left hand with 100% accuracy time, and the right hand with 5% accuracy. 10/23/23: Pt. requires heavy cues, and assist to grasp finger foods, and utensils, and transition between the  two.    Goal status: Continue/Ongoing   ASSESSMENT:  CLINICAL IMPRESSION:  Pt. tolerated ROM well, with less tightness noted in the digits of the bilateral hands. Pt. continues to present with flexor tightness, however was able to extend his digits more actively with when preparing to grasp the objects. Pt. Was able to consistently grasp the washers with the left hand. Pt. Presented  with more difficulty moving them between his hands. Pt.'s caregivers continue to be very supportive to continue working towards improving BUE functioning, ROM, strength, motor control, [facility] skills in order to maximize independence with daily ADLs, and IADL functioning. Plan to continue with the same goals into the next recertification period.   PERFORMANCE DEFICITS in functional skills including ADLs, IADLs, coordination, dexterity, proprioception, ROM, strength, pain, FMC, GMC, decreased knowledge of use of DME, and UE functional use, cognitive skills including safety awareness, and psychosocial skills including coping strategies, environmental adaptation, habits, and routines and behaviors.   IMPAIRMENTS are limiting patient from ADLs, IADLs, leisure, and social participation.   COMORBIDITIES may have co-morbidities  that affects occupational performance. Patient will benefit from skilled OT to address above impairments and improve overall function.  MODIFICATION OR ASSISTANCE TO COMPLETE EVALUATION: Maximum or significant modification of tasks or assist is necessary to complete an evaluation.  OT OCCUPATIONAL PROFILE AND HISTORY: Comprehensive assessment: Review of records and extensive additional review of physical, cognitive, psychosocial history related to current functional performance.  CLINICAL DECISION MAKING: High - multiple treatment options, significant modification of task necessary  REHAB POTENTIAL: Fair    EVALUATION COMPLEXITY: High    PLAN: OT FREQUENCY: 2 x's a week  OT DURATION:12  weeks  PLANNED INTERVENTIONS: self care/ADL training, therapeutic exercise, therapeutic activity, neuromuscular re-education, manual therapy, passive range of motion, patient/family education, and cognitive remediation/compensation RECOMMENDED OTHER SERVICES: PT  CONSULTED AND AGREED WITH PLAN OF CARE: Patient and family member/caregiver  PLAN FOR NEXT SESSION: Review HEP   Richardson Otter, MS, OTR/L  02/12/24

## 2024-02-14 ENCOUNTER — Ambulatory Visit: Admitting: Occupational Therapy

## 2024-02-14 ENCOUNTER — Ambulatory Visit

## 2024-02-19 ENCOUNTER — Ambulatory Visit

## 2024-02-19 ENCOUNTER — Ambulatory Visit: Admitting: Occupational Therapy

## 2024-02-19 DIAGNOSIS — M6281 Muscle weakness (generalized): Secondary | ICD-10-CM

## 2024-02-19 DIAGNOSIS — R278 Other lack of coordination: Secondary | ICD-10-CM

## 2024-02-19 DIAGNOSIS — R269 Unspecified abnormalities of gait and mobility: Secondary | ICD-10-CM | POA: Diagnosis not present

## 2024-02-19 DIAGNOSIS — R2689 Other abnormalities of gait and mobility: Secondary | ICD-10-CM

## 2024-02-19 DIAGNOSIS — R262 Difficulty in walking, not elsewhere classified: Secondary | ICD-10-CM

## 2024-02-19 DIAGNOSIS — H543 Unqualified visual loss, both eyes: Secondary | ICD-10-CM | POA: Diagnosis not present

## 2024-02-19 DIAGNOSIS — R2681 Unsteadiness on feet: Secondary | ICD-10-CM | POA: Diagnosis not present

## 2024-02-19 DIAGNOSIS — I6930 Unspecified sequelae of cerebral infarction: Secondary | ICD-10-CM | POA: Diagnosis not present

## 2024-02-19 DIAGNOSIS — R41841 Cognitive communication deficit: Secondary | ICD-10-CM | POA: Diagnosis not present

## 2024-02-20 NOTE — Therapy (Signed)
 OUTPATIENT PHYSICAL THERAPY NEURO TREATMENT  Patient Name: Paul Hall MRN: [id number] DOB:[birth year], 28 y.o., male Today's Date: 02/20/2024   PCP:  Morene Sous, PA-C  REFERRING PROVIDER: Morene Sous, PA-C  END OF SESSION:  PT End of Session - 02/19/24 1750     Visit Number [id number]    Number of Visits 200   date corrected from most recent recert   Date for Recertification  03/18/24    Authorization Type [employer]/ [place] Medicaid    Authorization Time Period 01/04/21-03/29/21; Recert 03/24/2021-06/16/2021; Recert 09/15/2021- 12/08/2021; Recert 12/13/2021-03/07/2022    Progress Note Due on Visit 190    PT Start Time 1538    PT Stop Time 1614    PT Time Calculation (min) 36 min    Equipment Utilized During Treatment Gait belt    Activity Tolerance Patient tolerated treatment well    Behavior During Therapy WFL for tasks assessed/performed             Past Medical History:  Diagnosis Date   Diabetes mellitus (HCC)    Hypertension    Stroke [facility])    Past Surgical History:  Procedure Laterality Date   IVC FILTER PLACEMENT ([facility] HX)     LEG SURGERY     PEG TUBE PLACEMENT     TRACHEOSTOMY     Patient Active Problem List   Diagnosis Date Noted   Sepsis due to vancomycin  resistant Enterococcus species (HCC) 06/06/2019   SIRS (systemic inflammatory response syndrome) (HCC) 06/05/2019   Acute lower UTI 06/05/2019   VRE (vancomycin -resistant Enterococci) infection 06/05/2019   Anemia 06/05/2019   Skin ulcer of sacrum with necrosis of muscle (HCC)    Urinary retention    Type 2 diabetes mellitus without complication, with long-term current use of insulin  (HCC)    Tachycardia    Lower extremity edema    Acute metabolic encephalopathy    Obstructive sleep apnea    Morbid obesity with BMI of 60.0-69.9, adult (HCC)    Goals of care, counseling/discussion    Palliative care encounter    Sepsis (HCC) 04/27/2019   H/O insulin  dependent diabetes mellitus 04/27/2019    History of CVA with residual deficit 04/27/2019   Seizure disorder (HCC) 04/27/2019   Decubitus ulcer of sacral region, stage 4 (HCC) 04/27/2019    ONSET DATE: October 2020  REFERRING DIAG: Cerebral infarction, unspecified   THERAPY DIAG:  Muscle weakness (generalized)  Other lack of coordination  Difficulty in walking, not elsewhere classified  History of CVA with residual deficit  Unsteadiness on feet  Other abnormalities of gait and mobility  Abnormality of gait and mobility  Rationale for Evaluation and Treatment: Rehabilitation  SUBJECTIVE:  SUBJECTIVE STATEMENT: Patient reports feeling better today but still has that cold feeling.  Pt accompanied by: family member  PERTINENT HISTORY: Paul Hall is a 29 y/o male who presents with severe weakness, quadriparesis, altered sensorium, and visual impairment s/p critical illness and prolonged hospitalization. Pt hospitalized in October 2020 with ARDS 2/2 COVID19 infection. Pt sustained a complex and lengthy hospitalization which included tracheostomy, prolonged sedation, ECMO. In this period pt sustained CVA and SDH. Pt has now been liberated from tracheostomy and G-tube. Pt has since been hospitalized for wound infection and UTI. Pt lives with parents at home, has hospital bed and left chair, hoyer lift transfers, and power WC for mobility needs. Pt needs heavy physical assistance with ADL 2/2 BUE contractures and motor dysfunction   PAIN:  Are you having pain? No  PRECAUTIONS: Fall  RED FLAGS: None   WEIGHT BEARING RESTRICTIONS: No  FALLS: Has patient fallen in last 6 months? No  LIVING ENVIRONMENT: Lives with: lives with their family Lives in: House/apartment Stairs:  Has following equipment at home: Wheelchair (power) and  hospital bed, hoyer lift, Sit to stand lift  PLOF: Independent  PATIENT GOALS: I want to walk  OBJECTIVE:  Note: Objective measures were completed at Evaluation unless otherwise noted.  DIAGNOSTIC FINDINGS: CLINICAL DATA:  Altered mental status.  History of COVID and stroke.   EXAM: CT HEAD WITHOUT CONTRAST   TECHNIQUE: Contiguous axial images were obtained from the base of the skull through the vertex without intravenous contrast.   COMPARISON:  CT head 11/11/2012   FINDINGS: Brain: Left cerebral convexity cortical hypodensity with associated cortical calcification extending from the frontal to the parietal lobe. Extensive area of volume loss and low-density compatible with chronic infarction. This is noted on prior reports from [facility]. Probable chronic watershed infarct. Additional small areas of hypodensity in the right frontal white matter consistent with chronic ischemia.   Negative for acute infarct. No acute hemorrhage or mass. Ventricle size normal without midline shift.   Vascular: Normal arterial flow voids.   Skull: Negative   Sinuses/Orbits: Mucosal edema paranasal sinuses with air-fluid level left sphenoid sinus. Negative orbit   Other: None   IMPRESSION: Extensive watershed type infarct over the left cerebral convexity with cortical calcification. This appears chronic. No associated hemorrhage, hydrocephalus, or midline shift.   These results were called by telephone at the time of interpretation on 05/09/2019 at 3:12 pm to provider [name] , who verbally acknowledged these results.     Electronically Signed   By: [name] M.D.   On: 05/09/2019 15:13  COGNITION: Overall cognitive status: Impaired   SENSATION: Light touch: WFL  COORDINATION: Delayed with BLE  EDEMA:  None observed  MUSCLE TONE: RLE: Moderate  POSTURE: rounded shoulders and forward head  LOWER EXTREMITY ROM:     Active  Right Eval Left Eval  Hip flexion     Hip extension    Hip abduction    Hip adduction    Hip internal rotation    Hip external rotation    Knee flexion    Knee extension    Ankle dorsiflexion    Ankle plantarflexion    Ankle inversion    Ankle eversion     (Blank rows = not tested)  LOWER EXTREMITY MMT:    MMT Right Eval Left Eval  Hip flexion    Hip extension    Hip abduction    Hip adduction    Hip internal rotation    Hip  external rotation    Knee flexion    Knee extension    Ankle dorsiflexion    Ankle plantarflexion    Ankle inversion    Ankle eversion    (Blank rows = not tested)  BED MOBILITY:  Findings: Rolling to Right Max A Rolling to Left Max A  TRANSFERS: Sit to stand: Max A x 2 - just able to clear gluteals off mat  Assistive device utilized: HHA x2 to stand- dependent on hoyer for actual transfers     Stand to sit: Max A  Assistive device utilized: HHA x 2        GAIT: Findings: Non-Ambulatory  FUNCTIONAL TESTS:  None appropriate at this time  PATIENT SURVEYS:                                                                                                                                TREATMENT DATE: 02/19/2024   TA: Deitra transfer from power w/c to [address] [facility]- dependent on PT and mother to position onto mat then max assist to pull into sitting at [facility].   Sitting at [facility]- Ant/poster weight shift x 30- progressive increase in ROM with increased reps.  Sitting at [facility]- Lateral weight shift x 30 - progressive increase in ROM- Reaching limits of stability Static sitting with attempting to place B hands on edge of mat x several min.  Dynamic chest press (jab) with some thoracic rotation- x 15 reps each direction.   Patient then complained of feeling weird and requested to go back to his chair. BP= 126/94 mmHg - Dependently hoyer patient from EOM back to chair.    PATIENT EDUCATION: Education details: Exercise technique; Plan for recert today Person educated: Patient and  Parent Education method: Explanation Education comprehension: verbalized understanding  HOME EXERCISE PROGRAM: Access Code: [email or web address] URL: [email or web address] Date: 03/23/2022 Prepared by: Reyes London   Exercises - Supine Bridge  - 3 x weekly - 3 sets - 10 reps - 2 hold - Supine Gluteal Sets  - 3 x weekly - 3 sets - 10 reps - 5 sec hold - Supine Quad Set  - 1 x daily - 3 x weekly - 3 sets - 10 reps - 5 hold - Seated Long Arc Quad  - 1 x daily - 7 x weekly - 3 sets - 10 reps - Seated Hip Adduction Squeeze with Ball  - 1 x daily - 3 x weekly - 3 sets - 10 reps - 5 hold - Seated Hip Abduction  - 1 x daily - 3 x weekly - 3 sets - 10 reps - 2 hold  GOALS: Goals reviewed with patient? Yes  SHORT TERM GOALS: Target date: 02/05/2024  Patient will be independent with updated HEP for improved overall LE strength in home Baseline: HEP continues to be updated as appropriate.  Goal status: NEW  LONG TERM GOALS: Target date: 03/18/2024 -Goal were updated/revised secondary to length of chart.  Patient will perform sit to stand transfer with appropriate AD and max assist of 2 people with 75% consistency to prepare for pregait activities.  Baseline: 09/04/2023- Patient performed several Sit to stand requiring max A+2  with improving posture- able to ext R knee better - near terminal knee and able to clear bottom off mat- still not full but able to contract glutes for standing as erect as possible.  Goal status: ONGOING  2.  Patient will tolerate 2 minutes or more of standing in sit to stand lift or with max assist +2 in order to indicate improved lower extremity weightbearing tolerance for progression to standing in parallel bars  Baseline:  09/04/2023- Patient performed 3 trials of standing ranging from 17 -47 sec - improving right knee terminal knee ext and more activation upon command with gluteals- able to clear mat table. Continued to require Max A+2 and blocking knees with  forearm/trunk support.   Goal status: ONGOING  3.  Patient will perform rolling left to right with Mod assist for improved independence with bed mobility to assist with dressing and positioning in bed.  Baseline: Dependent on caregiver to roll to left; 12/27/2023= Mod to max assist each direction (difficult due to RUE tone)  Goal status: Ongoing  4.  Patient will perform active bridging -able to clear bottom   Off mat for 10 reps with PT/caregiver holding his feet for improved trunk ext strength with standing and to assist with mobility/positioning.  Baseline: Unable to perform 10 reps with consistent clearing of bottom; 12/27/2023 = Patient able to minimally clear his glues off mat table - but successfully 10 reps today.  Goal status: MET  5. Patient will perform active bridging -able to clear bottom   Off mat for 3 sets of 10 reps with PT/caregiver holding his feet for improved trunk ext strength with standing and to assist with mobility/positioning.  Baseline: 12/27/2023- Patient able to complete 10 reps with max effort  Goal status: New     ASSESSMENT:  CLINICAL IMPRESSION: Treatment was limited secondary to patient was a few min late then later in session complained of feeling weird- slightly dizzy- checked BP and vitals were okay. Decided to terminate session and transfer back to chair. He was able to perform several dynamic sitting at EOM activities without LOB.  Pt will continue to benefit from skilled physical therapy intervention to address impairments, improve QOL, and attain therapy goals.   OBJECTIVE IMPAIRMENTS: Abnormal gait, cardiopulmonary status limiting activity, decreased activity tolerance, decreased balance, decreased cognition, decreased coordination, decreased endurance, decreased knowledge of condition, decreased knowledge of use of DME, decreased mobility, difficulty walking, decreased ROM, decreased strength, hypomobility, impaired flexibility, impaired sensation,  impaired UE functional use, impaired vision/preception, postural dysfunction, obesity, and pain.   ACTIVITY LIMITATIONS: carrying, lifting, bending, sitting, standing, squatting, stairs, transfers, bed mobility, bathing, toileting, dressing, self feeding, reach over head, and hygiene/grooming  PARTICIPATION LIMITATIONS: meal prep, cleaning, laundry, medication management, personal finances, interpersonal relationship, driving, shopping, community activity, occupation, yard work, and church  PERSONAL FACTORS: Time since onset of injury/illness/exacerbation and 3+ comorbidities: DM, CVA, Seizures are also affecting patient's functional outcome.   REHAB POTENTIAL: Good  CLINICAL DECISION MAKING: Evolving/moderate complexity  EVALUATION COMPLEXITY: Moderate  PLAN:  PT FREQUENCY: 1-2x/week  PT DURATION: 12 weeks  PLANNED INTERVENTIONS: 97110-Therapeutic exercises, 97530- Therapeutic activity, [id number]- Neuromuscular re-education, 97535- Self Care, [id number]- Manual therapy, and Patient/Family education  PLAN FOR NEXT SESSION:  Continue with Standing frame Resume standing as able  Attempt standing frame if appropriate again Nustep for UE/LE strengthening Mat table dynamic sitting activities.    Reyes LOISE London, PT 02/20/2024, 5:59 PM

## 2024-02-20 NOTE — Therapy (Signed)
 . Occupational Therapy Neuro Treatment Note    Patient Name: Paul Hall MRN: [id number] DOB:04-Aug-1995, 28 y.o., male Today's Date: 02/20/2024  PCP: Dr. Bertell REFERRING PROVIDER: Dr. Bertell   .  OT End of Session - 02/20/24 1049     Visit Number [id number]    Number of Visits 180    Date for Recertification  03/20/24    OT Start Time 1620    OT Stop Time 1705    OT Time Calculation (min) 45 min    Equipment Utilized During Treatment tilt in space power wc    Activity Tolerance Patient tolerated treatment well    Behavior During Therapy WFL for tasks assessed/performed            Past Medical History:  Diagnosis Date   Diabetes mellitus (HCC)    Hypertension    Stroke [facility])    Past Surgical History:  Procedure Laterality Date   IVC FILTER PLACEMENT ([facility] HX)     LEG SURGERY     PEG TUBE PLACEMENT     TRACHEOSTOMY     Patient Active Problem List   Diagnosis Date Noted   Sepsis due to vancomycin  resistant Enterococcus species (HCC) 06/06/2019   SIRS (systemic inflammatory response syndrome) (HCC) 06/05/2019   Acute lower UTI 06/05/2019   VRE (vancomycin -resistant Enterococci) infection 06/05/2019   Anemia 06/05/2019   Skin ulcer of sacrum with necrosis of muscle (HCC)    Urinary retention    Type 2 diabetes mellitus without complication, with long-term current use of insulin  (HCC)    Tachycardia    Lower extremity edema    Acute metabolic encephalopathy    Obstructive sleep apnea    Morbid obesity with BMI of 60.0-69.9, adult (HCC)    Goals of care, counseling/discussion    Palliative care encounter    Sepsis (HCC) 04/27/2019   H/O insulin  dependent diabetes mellitus 04/27/2019   History of CVA with residual deficit 04/27/2019   Seizure disorder (HCC) 04/27/2019   Decubitus ulcer of sacral region, stage 4 (HCC) 04/27/2019   ONSET DATE: 01/2019  REFERRING DIAG: CVA/COVID-19  THERAPY DIAG:  Muscle weakness (generalized)  Other lack of  coordination  Rationale for Evaluation and Treatment Rehabilitation  SUBJECTIVE:    SUBJECTIVE STATEMENT:   Pt. Reports having had a cold spell in PT today.   Pt accompanied by: self and family member  PERTINENT HISTORY:  Pt. is a 28 y.o. male who was diagnosed with COVID-19, and CVA with resultant quadriplegia. Pt. Was then hospitalized with VRE UTI. PMHx includes: urinary retention, seizure disorder, obstructive sleep disorder, DM Type II, Morbid obesity.   PRECAUTIONS: None  WEIGHT BEARING RESTRICTIONS No  PAIN:  No reports of pain Are you having pain? 0/10; UE tightness discomfort  FALLS: Has patient fallen in last 6 months? No  LIVING ENVIRONMENT: Lives with: lives with their family Lives in: House/apartment Stairs: No Level Entry Has following equipment at home: Wheelchair (power) and hoyer lift, sit to stand lift  PLOF: Independent  PATIENT GOALS: To be able to engage in more daily care tasks.  OBJECTIVE:   HAND DOMINANCE: Right  ADLs:  Transfers/ambulation related to ADLs: Eating: Pt. reports being able to hold standard utensils, and is starting to engage more in self-feeding tasks, hand to mouth patterns. Pt. Reports that he does as much as he can with the task, and family assists  with the remainder of the task. Grooming: Pt. is able to initiate holding an electric toothbrush, and  brush his teeth. Family assists LB Dressing: Total Assist UE dressing: Pt. is now able to reach up to actively assist with grasping , and pulling his gown down. Toileting:  Total Assist Bathing: MaxA UB, Total assist LB Tub Shower transfers: N/A Equipment: See above    IADLs: Shopping: Relies on family to assist Light housekeeping: Total Assist Meal Prep: Total Assist Community mobility:   Medication management:  Total Assist  Financial management: N/A Handwriting: Not legible: Pt. Is able to hold a pen with the left hand, and initiate marking the page. Pt.'s eye glasses  were not available  MOBILITY STATUS:  Power w/c  POSTURE COMMENTS:  Pt. Requires position changes in his power w/c  ACTIVITY TOLERANCE: Activity tolerance:  Fair  FUNCTIONAL OUTCOME MEASURES: FOTO: 40 TR score: 45  05/25/2022:   FOTO: 50 TR score: 45  07/20/22: FOTO 55  03/08/23: 51  UPPER EXTREMITY ROM     Active ROM Left eval Left  03/21/2022 Left 05/25/2022 L  07/20/22 Left 07/25/2022 Left  08/15/2022 Left  09/26/2022 Left  11/16/2022 Left 12/28/2022 Left 03/13/23 Left 05/10/23 Left 10/23/23 Left 12/27/23  Shoulder flexion 119 123 125 130  136 138 130 142 144 144 148 148  Shoulder abduction 110 115 115 115  118 121 125 142 140 140 140 140  Shoulder adduction               Shoulder extension               Shoulder internal rotation               Shoulder external rotation               Elbow flexion 135(135) 145 145 145  145    145 145  145  Elbow extension -27(-18) -21(-20) -20(-16)  -25(-10) -23(-10) -21(-10) -20(-18) -20(-18) -20(-17) -17(-16) -12(-8) -12(-6)  Wrist flexion               Wrist extension 10(50) 19(50) 28(50)  30(50) 35(55) 36(55) 36(60) 40(60) 45(60) 45(60) 42(54) 36(54)  Wrist ulnar deviation               Wrist radial deviation               Wrist pronation            60(74) 84(84)  Wrist supination   Limited by flexor tone       10(15)  30(38) 20(38)  (Blank rows = not tested)    Active ROM Right eval Right 03/21/2022 Right 05/25/2022 R  07/20/22 Right  07/25/22 Right 08/15/2022 Right 09/26/2022 Right 11/16/2022 Right 12/28/2022 Right 03/13/23 Right 05/10/23 Right 10/23/23 Right  9/24 /25  Shoulder flexion 106 scaption 105  Scaption 62 flexion 110 scaption 100  105 105 105 112 Scaption 123 Scaption 122  Scaption 142 Scaption 142  Shoulder abduction 114 90 97 100  105 117 120 124 124 122 144 146  Shoulder adduction               Shoulder extension               Shoulder internal rotation               Shoulder external rotation                Elbow flexion 120(130) 145 145 140  144 140 142 148 148 145  145  Elbow extension -45(-35) -22(-35) -28(-22)  -32(-21) -  32(-28) -28(-26) -28(-28) -27(-26) -27(-40) -38(-35) -34(-26) -34(-26)  Wrist flexion               Wrist extension -30(10) -25(10) -10(30)  -10(20) -10(25) -20(40) -30(10) -22(34) -22(35) -32(22) -30(-8) -38(-6)  Wrist ulnar deviation               Wrist radial deviation               Wrist pronation               Wrist supination   Limited by flexor tone                 12/28/2022: Thumb radial abduction: Right: 12(46), Left: 40(54)  Digits: Bilateral 2nd through 5th digit: MP Hyperextension with PIP, an DIP flexion    10/23/23:  Bilateral 2nd through 5th digit MP hyperextension with PIP, and DIP flexion: Pt. has been intermittently able to initate active diigt PIP, and DIP extension following Botox during this past recertification/progress reporting period.     UPPER EXTREMITY MMT:     Left eval Left 03/21/2022 Left  05/25/2022 L 07/20/22 Left  08/15/2022 Left 09/26/2022 Left 11/16/2022 Left 12/28/2022 Left  03/13/2023 Left  05/10/23 Left 10/23/23 Left 12/27/23  Shoulder flexion 3-/5 3/5 3+/5 4+/5 4+/5 4+/5 4+/5 4+/5 4+/5 4+/5 4+/5 4+/5  Shoulder abduction 3-/5 3-/5 4-/5 4+/5 4+/5 4+5 4+/5 4+/5 4+/5 4+/5 4+/5 4+/5  Shoulder adduction              Shoulder extension              Shoulder internal rotation              Shoulder external rotation              Elbow flexion 3/5 3+/5 4-/5 4+/5 5/5 5/5 5/5 5/5 5/5 5/5 5/5 5/5  Elbow extension 2-/5 2/5  4+/5 4+/5 4+/5  4/5 4/5 4/5 through available range 4+/5 through available range 4+/5  Wrist flexion              Wrist extension 2/5 2+/5 3-/5  3-/5 3-/5 3-/5 3/5 3/5 3/5 3+/5 3-/5  Wrist ulnar deviation              Wrist radial deviation              Wrist pronation              Wrist supination              (Blank rows = not tested)  Right eval Right 03/21/2022 Right  05/25/2022 R 07/20/22 Right 08/15/2022  Right 09/26/2022 Right 11/16/2022 Right 12/28/2022 Right 03/13/2023 Right 05/10/23 Right 10/23/23 Right 12/27/23  Shoulder flexion 3-/5 scaption 3-/5 3-/5 3+/5 3+/5 3+/5 3/5 3/5 3/5 3+/5 4-/5 4/5  Shoulder abduction 3-/5 3-/5 3-/5 4/5 4/5 4/5 4/5 4/5 4/5 4/-5 4-/5 4/5  Shoulder adduction              Shoulder extension              Shoulder internal rotation              Shoulder external rotation              Elbow flexion 3/5 3/5 3+/5 4/5 4+/5 4+/5 4+/5 4+/5 4+/5 4+/5 4+/5 5/5  Elbow extension 2-/5 2/5 3-/5 4+/5 4+/5 4+/5 4+/5 4-/5 4-/5 4-/5 4-/5 through available range 3-/5  Wrist flexion  Wrist extension 2-/5 2-/5 2/5 2/5 2/5 2/5 2/5 2/5 2/5 2/5 2/5 2-/5  Wrist ulnar deviation              Wrist radial deviation              Wrist pronation              Wrist supination                HAND FUNCTION:  Grip strength: Right: 0 lbs; Left: 0 lbs and Lateral pinch: Right: 5 lbs, Left: 2 lbs  03/21/2022: Lateral pinch: Right: 3.5 lbs, Left: 2 lbs  07/20/22: Grip strength: Right: 3 lbs; Left: 4 lbs and Lateral pinch: Right: 5 lbs, Left: 3 lbs  08/15/2022:  Grip strength: Right: 0 lbs; Left: 5 lbs and Lateral pinch: Right: 2 lbs, Left: 4 lbs  09/26/2022  Grip strength: Right: 0 lbs; Left: 8 lbs and Lateral pinch: Right: 2 lbs, Left: 5 lbs  11/16/2022  Grip strength: Right: 0 lbs; Left: 8 lbs  9/25/20204  Grip strength: Right: 0 lbs; Left: 8 lbs  Grip strength: Right: 0 lbs; Left: 8 lbs   03/08/23:  Grip strength: Right: 0 lbs without PROM, 5 lbs after PROM, Left 8 lbs  05/10/23:  Grip strength: TBD  Bilateral digit PIP/DIP flexion contractures with MP hyperextension with attempts for AROM. Pt. is able to tolerate AROM to the bilateral digits at the initial evaluation however, has a history of pain in the digits.  COORDINATION: Eval: Pt. is unable to grasp 9-hole test pegs. Pt. is able to initiate grasping larger pegs, and is able to hold a pen in the left  hand.  07/20/22: 2 min 36 seconds to remove 9 pegs from 9 hole peg test - cues to locate pegs 2/2 low vision. Pt. is able to initiate grasping larger pegs on R hand and is able to hold a pen in the left hand.  08/15/2022:    Vision, and sensation limiting accuracy of 9 hole peg test results. Pt. Was able to grasp and remove vertical pegs intermittently with cues.    05/10/23: TBD  SENSATION: Light touch: Impaired   EDEMA:  N/A  MUSCLE TONE: BUE flexor Spasticity  COGNITION Overall cognitive status: Continue to assess in functional context  VISION:   Subjective report: Pt. was not wearing glasses at the time of the initial eval.  Baseline vision: Vision is very limited. Wears glasses all the time Visual history: History of impaired vision following CVA. Pt. Has received treatment through the [facility] low vision rehabilitation program.   VISION ASSESSMENT: Impaired To be further assessed in functional context  PERCEPTION: Impaired   PRAXIS: Impaired: motor planning  OBSERVATIONS:  Pt reports being on Tramadol   TODAY'S TREATMENT 02/19/24   Moist heat modality to the RUE, and bilateral hands in conjunction with ROM  Therapeutic Ex.:    -BUE tolerated PROM was performed with emphasis placed on slow prolonged gentle stretching of distal RUE for elbow extension, and forearm supination, bilateral hands including: wrist/digit flexion/extension including MCPs, PIPs, DIPs of all digits, and wrist flexion/extension, and forearm supination in conjunction with moist heat modality 2/2 flexor tone, and tightness.   Therapeutic Activities:   -Facilitated Bilateral bimanuel coordination using drumsticks. At the tabletop top surface. -Facilitated right hand grasp woith cues, and assist with promote gross digit extension when anticipating grasping the the drumstick handle. -Facilitated reaching up through multiple plans with RUE while holding the drumsticks.  PATIENT EDUCATION: Education  details: ROM, BUE tone  Person educated: Patient and Parent Education method: Explanation Education comprehension: verbalized understanding  HOME EXERCISE PROGRAM:    Incorporating  the right hand during self-feeding tasks.   GOALS: Goals reviewed with patient? Yes  SHORT TERM GOALS: Target date: 12/05/2023   To assess splint fit, and make appropriate adjustments to promote good skin integrity through the palmar surface of the bilateral hands.  Baseline: 05/25/22: Goal currently met, however ongoing as needs to assess splint fit arise. 03/23/2022: Pt. is wearing splints a couple of hours at night bilateral resting hand splints. 03/21/2022: Pt. is wearing splints a couple of hours at night bilateral resting hand splints. Goal status: Deferred   LONG TERM GOALS: Target date: 03/20/2024   FOTO score will Improve by 2 points for Pt. perceived improvement with the assessment specific ADL/IADL tasks.  Baseline: 03/08/23: 51; 12/28/2022: FOTO score: 56; 11/16/2022: 52 09/27/2022: 51 07/20/2022: FOTO 55 05/25/2022: FOTO score: 50,  TR score: 45 Eval: FOTO score: 40, TR score: 45 Goal status: Achieved   2.   Pt. will independently perform oral care for 100% of the task after complete set-up. Baseline: 05/10/23: Pt. Performs 90% of the task. Pt.'s mother assists with 10% of the task for thorough cleaning the hard to reach places way in the back of the oral cavity.03/08/23: not as much assist required proximally, but to adjust positioning of toothbrush; 02/13/23: Pt. Continues to require assist proximally 01/09/2023: Continue 12/28/2022:  Pt. Continues to complete 90% of the task with complete set-up, with visual cues, and occassional assist of the right elbow. 11/16/2022: Pt. Completes 90% of the task with complete set-up. 09/26/2022: Completes 90% of the task, proximal assist at times required at the elbow.  08/15/2022: Completes 90% of the task, proximal assist at times required at the elbow. 07/20/22: completes  90% of task, limited by shoulder flexion. 05/25/2022:  Pt. Is able to initiate and perform oral care for approximately 90% of the task. Complete set-up required. Assi needed only for the very back teeth. 03/23/2022: Pt. Is able to initiate and perform oral care for approximately 75% of the task. Pt. Requires assist at proximally at the elbow for through oral care. 03/21/2022: Pt. Is able to initiate and perform oral care for approximately 75% of the task. Pt. Requires assist at proximally at the elbow for through oral care. Eval: Pt. is able to initiate using an electric toothbrush. Pt. requires assist for set-up, and assist for thoroughness, and as he Pt. fatigues. Goal status:  Partially met, Goal discontinued  3.  Pt. Will be modified independence with self-feeding for 100% using a swivel spoon, and standard fork after complete set-up Baseline: 05/10/2023: Pt. feeds self 95-100% of the meal. with occasional assist to reposition the utensil. 03/08/23: indep with cup with a handle and lid, assist to reposition eating utensils in hand;  02/13/23:  Pt. Is now able to consistently, and efficiently use a swivel spoon for eating cereal with the left hand.01/09/2023: Continue 12/28/2022: Pt. Is now feeding himself 90% of the time using a swivel spoon with the left hand, and 95% of the time with the fork. Pt. Continues to have difficulty with cutting food. 11/16/2022: 100% with finger foods, Pt. Is initiating with a swivel spoon, and universal cuff for fork use, however requires assist for consistency, and accuracy.  09/26/2022: 90% using the spoon, assist at time with the forearm motion, and grip on the utensils. 100% for finger foods.  08/15/2022:  90% using the spoon, assit at time with the forearm motion, and grip on the utensils. 100% for finger foods-independent with set-up- unsupervised.  07/20/22: self-feeds cereal using spoon 90% of task. 05/25/2022: Pt. Is able to use a spoon to scoop cereal when feeding himself  cereal 85% of the time. Pt. Is able to feed himself snack/finger foods 100% of the time. Pt. Continues to work on consistency of  stabilizing a cup/mug when drinking. Pt. Is able to grasp a water  bottle with assist initially, with assist tapering off as he drinks.03/23/2022: Pt. Is able to perform scooping cereal for 75% of the time. Pt. required assist, and support at the left elbow, and Pt. Presents with limited forearm supination when using the spoon, and bringing it towards his mouth. Pt. Is able to use a fork to spear items, and perform the hand to mouth pattern.  03/21/2022: Pt. Is able to perform scooping cereal for 75% of the time. Pt. required assist, and support at the left elbow, and Pt. presents with limited forearm supination when using the spoon, and bringing it towards his mouth. Pt. Is able to use a fork to spear items, and perform the hand to mouth pattern.  Eval: Pt. is able to hold standard standard utensils. Pt. Performs as much of the task as he, can and has assistance for the remainder. Goal status:  Improved, Achieved  4.  Pt. will improve grasp patterns and consistently grasp 1/4 objects for ADL, and IADL tasks.  Baseline: 01/08/24: Continue, carry goal over from recertification update last session. 12/27/23: Pt. is able to present with increased tightness  2/2 being way overdue for Botox injections, which limits consistency with grasping small objects. 10/23/2023: Pt. is able to initiate grasping small objects visual cues, and increased time, however continues to work towards grasping 1/4 objects efficiently. 08/27/23: Pt. is able to grasp flat discs, and sustain a secure hold on them while moving the UE through various planes. 08/07/23: Continue 05/31/2023: Continue 05/10/23: Pt. is consistently able to grasp 1/2 objects with visual cues. Pt. Requires assist, and increased visual cues to grasp 1/4 objects on an elevated plane.  Pt. Is unable to grasp the flat objects at the tabletop  surface. Pt. Is grasping grasping small puppy vitamins.03/08/23: 1/2 objects efficiently; 02/13/2023: 1/2 objects efficiently 01/09/2023: Continue 12/28/2022: Continues to grasp 1/4 pegs with min a + visual cues, consistently grasping 1/2 objects with visual cues 11/16/2022: Continues to grasp 1/4 pegs with min a + visual cues, consistently grasping 1/2 objects with visual cues 09/26/2022: Continues to grasp 1/4 pegs with min a + visual cues, consistently grasping 1/2 objects with visual cues. 08/15/2022: grasps 1/4 pegs with min a + visual cues, consistently grasping 1/2 objects with visual cues. 07/20/22: grasps 1/4 pegs with min a + visual cues, consistently grasping 1/2 objects with visual cues. 05/25/2022: Pt. Is working on improving consistency of grasping 1/2 objects with visual cues.  03/23/2022: Pt. Is able to grasp 1 objects consistently,and continues to work on the hand patterns needed to grasp 1/2 objects.03/21/2022: Pt. Is able to grasp 1 objects consistently,and continues to work on the hand patterns needed to grasp 1/2 objects. Eval: Pt. is able to grasp 1 objects intermittently using a lateral grasp pattern. Goal status: Ongoing  5.  Pt. will independently write his name legibly with letter sizes under 1. Baseline: 05/10/23: Deferred 03/08/23: Not addressed this period; 02/13/2023: Continue 01/09/2023: Continue. 12/28/2022: Pt. is now using an IPad Pen. 11/16/2022:  Pt. Continues to be able to write name with smaller letter size. Pt. Is signing name with a computer styus. Pt. Requires visual cues, and assist 09/26/2022: Pt. Continues to be able to write name with smaller letter size. Pt. Is signing name with a computer styus. Pt. Requires visual cues, and assist. 08/15/2022: Pt. Is able to write name with smaller letter size. Pt. Is signing name with a computer styus. Pt. Requires visual cues, and assist. 07/20/22: stabilizing assist to write name with 75% legibility with 2 letters.  05/25/2022: Pt. to continue to work towards formulating grasp patterns in preparation for grasping a large width pen. Pt. Requires visual cues. 03/23/2022: Pt. Is able to write his name with modA, however has difficulty with formulating letter sizes less than 2 in size with 50% legibility for the 3 letters of his name.03/21/2022: Pt. Is able to write his name with modA, however has difficulty with formulating letter sizes less than 2 in size with 50% legibility for the 3 letters of his name. Eval: Pt is able to hold a thin marker with his left hand, and formulate a line, and initiate a circular pattern (Pt. without glasses today) Goal status: Deferred  6. Pt. Will reach up to comb/brush his hair with minA.  Baseline: 05/10/23: Pt. is able to use a pick independently with the left hand with occasional assist to the far side of the left his head. 03/08/23: Pt uses pick in L hand to reach 75% of his head; unable to reach R side of head; 02/13/2023:Pt. is able to use a hair pick with the left hand on the right side of his head, top, and back of the head. Pt. Continues to have difficulty reaching further to the right side of his head with the left. 01/09/2023: Continue 12/28/2022: Pt. Is progressing, and is now able to use a hair pick with the left hand on the right side of his head, top, and back of the head. Pt. Continues to have difficulty reaching further to the right side of his head with the left. 11/16/2022: Pt. Is able to use a hair pick for the left side of his head using his left hand. Pt. Presents with difficulty reaching to the right side of his head.09/26/2022: Pt. Is able to use a hair pick for the left side of his head using his left hand. Pt. Presents with difficulty reaching to the right side of his head. 5/13/202: 75% using a hair pick, assist to the right side of the head. 07/20/22: reaches 75% of head, assist for far R side of head, fatigues quickly. 05/25/2022: Pt. Is able to reach up with the left  hand to the left side, top, and back of his head. 03/23/2022: Pt. is now able to more consistently initiate reaching up to his head with his left hand in preparation for haircare12/18/2023: Pt. is now able to more consistently initiate reaching up to his head with his left hand in preparation for haircare. Eval: Pt. is able to initiate reaching up for hair care with a long handled brush, however is unable to sustain UE's in elevation to perform the task.     Goal status: Achieved  7. Pt. Will independently navigate the w/c through his environment with minA with visual scanning, and hand placement on the controls.  Baseline: 11/16/2022: Deferred 2/2 vision 09/26/2022: Pt. Is now navigating his w/c ouside the home, and around his block. 08/15/2022: Pt. Requires minA to setup hand on controls and MIN cues  to navigate the w/c in wide spaces, requires MIN - MOD cues to navigate the w/c through more narrow doorways, and tighter turns - varies based on good vs bad vision days.07/20/22: Pt. Requires minA to setup hand on controls and MIN cues to navigate the w/c in wide spaces, requires MIN - MOD cues to navigate the w/c through more narrow doorways, and tighter turns - varies based on good vs bad vision days. 05/25/2022: Pt. Requires minA  and  cues to navigate the w/c in wide spaces, and requires Mod cues to navigate the w/c through more narrow doorways, and tighter turns. Pt. Requires max cues for scanning through the environment, and moderate cues for hand placement on the controls.     Goal status: Deferred 2/2 vision    8. Pt. Will improve bilateral grip strength to be able to independently grasp, and pull up blankets, and linens.   Baseline: 05/10/23: Deferred 03/08/23: R grip 0-5 lbs, L grip 8 lbs; pt reports he can consistently pull up blankets and linens independently.  02/13/2023: Continue. Pt. presents with digit PIP, and DIP flexor tightness. 01/09/2023: Continue 12/28/2023: R: 0#, L: 5# Pt. Is now able to more  consistently grasp and pull blankets up over him. 11/16/2022: R: 0 L: 8# Pt. Is more consistently grasping his blankets and attempting to pull them up. 09/26/2022: R: 0  L: 8# Pt. is attempting to grasp, and pull blankets up more, using mostly the left hand. 08/15/2022: Pt. has difficulty securely holding, and pulling up blankets, and linens.    Goal status:Deferred  9. Pt. will consistently actively control the releasing of blankets, covers, and linens from his hands once they are in the desired position over him. Baseline: 12/28/2022: Pt. Is consistently actively able to release linens once they are in the desired position over him. 11/16/2022: Pt. continues to improve with actively releasing blankets form his hands. 09/26/2022: Pt. is improving with actively releasing blankets form his hands. 08/15/2022: Pt. has difficulty with controlled releasing of blankets/linens from his grasp.     Goal status: Achieved    10. Pt. Will improve bilateral UE strength by 2 MM grades to assist with ADLs, and IADLs  Baseline: 01/08/24: Continue, carry goal over from recertification update last session. 12/27/23:  Right: shoulder flexion: 4/5, abduction: 4/5, elbow flexion: 5/5 , extension: 3-/5 wrist extension: 2-/5; Left: shoulder flexion: 4+/5, abduction: 4+/5, elbow flexion: 5/5 , extension: 4+/5  wrist extension: 3+/5  08/27/23:  Right: shoulder flexion: 3+/5, abduction: 4-/5, elbow flexion: 4+/5 , extension: 4-/5 wrist extension: 2/5; Left: shoulder flexion: 4+/5, abduction: 4+/5, elbow flexion: 5/5 , extension: 4+/5  wrist extension: 3-/5  10/23/2023: Right: shoulder flexion: 4-/5, abduction: 4-/5, elbow flexion: 4+/5 , extension: 4-/5 wrist extension: 2/5; Left: shoulder flexion: 4+/5, abduction: 4+/5, elbow flexion: 5/5 , extension: 4+/5  wrist extension: 3+/5  08/27/23:  Right: shoulder flexion: 3+/5, abduction: 4-/5, elbow flexion: 4+/5 , extension: 4-/5 wrist extension: 2/5; Left: shoulder flexion: 4+/5, abduction: 4+/5,  elbow flexion: 5/5 , extension: 4/5  wrist extension: 3/5  08/07/23: Continue 05/31/2023: Continue 05/10/23: Right: shoulder flexion: 3+/5, abduction: 4-/5, elbow flexion: 4+/5 , extension: 4-/5 wrist extension: 2/5; Left: shoulder flexion: 4+/5, abduction: 4+/5, elbow flexion: 5/5 , extension: 4/5  wrist extension: 3/5 03/13/2023: Right: shoulder flexion: 3/5, abduction: 4/5, elbow flexion: 4+/5 , extension: 4-/5 wrist extension: 2/5; Left: shoulder flexion: 4+/5, abduction: 4+/5, elbow flexion: 5/5 , extension: 4/5  wrist extension: 3/5 03/08/23: NT this date d/t as pt arrived  late; 02/13/2023: Continue 01/09/2023: Continue 12/28/2022: Right: shoulder flexion: 3/5, abduction: 4/5, elbow flexion: 4+/5 , extension: 4-/5 wrist extension: 2/5; Left: shoulder flexion: 4+/5, abduction: 4+/5, elbow flexion: 5/5 , extension: 4/5  wrist extension: 3/5 Right: shoulder flexion: 3+/5, abduction: 4/5, elbow flexion: 4+/5 , extension: 4+/5 wrist extension: 2/5; Left: shoulder flexion: 4+/5, abduction: 4+/5, elbow flexion: 5/5 , extension: 4+/5 wrist extension: 3-/5 11/16/2022: Right: shoulder flexion: 3/5, abduction: 4/5, elbow flexion: 4+/5 , extension: 4+/5 wrist extension: 2/5; Left: shoulder flexion: 4+/5, abduction: 4+/5, elbow flexion: 5/5 , extension:  wrist extension: 3-/5 Right: shoulder flexion: 3+/5, abduction: 4/5, elbow flexion: 4+/5 , extension: 4+/5 wrist extension: 2/5; Left: shoulder flexion: 4+/5, abduction: 4+/5, elbow flexion: 5/5 , extension: 4+/5 wrist extension: 3-/5 Goal status: Ongoing  11. Pt. will improve right wrist extension by 10 degrees to initiate reaching for items in preparation for ADLs. Baseline:01/08/24: Continue, carry goal over from recertification update last session.  12/27/23: Right wrist extension: -38(-6) 10/23/23: -30(-8) 08/23/2023: R Wrist Ext: -31(-8)  08/07/23: Continue 05/31/23: Continue 05/10/23: Right wrist extension: -32(22) 03/13/23: -22(35) 03/08/23: NT this date as pt arrived  late; 02/13/2023: Continue 01/09/2023: Continue 12/28/2022: -22(34) 11/16/2022: -30(10)    Goal status: Ongoing      12. Pt. Will require donn/doff a Tank top T-shirt efficiently with supervision and full set-up.    Baseline: 01/08/24: Continue, carry goal over from recertification update last session. 12/27/23: Independent doffing, MaxA doffing 10/23/23: Independent doff the shirt/tank, MaxA  donning. 10/23/23: Pt. Is able to independently doff his shirt, requiring assist to lean forward and loosen the back. 08/07/23: Continue 05/31/2023: Pt. Requires maxA donning a pullover sweatshirt, Mod-maxA doffing it. T-shirt tank TBD 05/10/23: Pt. Presents with difficulty completing, and requires increased time to complete    Goal status: Ongoing    13. Pt. Will independently perform bilateral/bimanual hand tasks needed to fold towels/linens/laundry Baseline: 01/08/24: Continue, carry goal over from recertification update last session. 12/27/23: Pt. Has not been able to work on the this  2/2 multiple cancellations this recertification 2/2 illness, familiy health issues,a nd transportation. 10/23/23: Pt. Continues to have difficulty folding laundry, and linens. 08/23/23: Pt. Continues to have difficulty using bilateral/bimanual hand tasks needed to fold towels/linens/laundry. 08/07/23: Continue 05/31/23: Continue 05/10/23:  Pt. Has difficulty folding towels/lines/laundry using bilateral hands Goal status:Ongoing  14. Pt. Will independently, and efficiently reach his right arm over the side of the chair to pet his dog.    Baseline: 08/23/2023: Pt. Is now able to reach over the side of the armrest of his recliner chair to pet his dog.  08/07/23: Continue 05/31/2023: Continue 05/10/23: Pt. is unable to efficiently reach his right UE over the side of his recliner chair to pet his dog.    Goal status: Achieved    15. Pt. Will independently, and efficiently reach out in front of him to efficiently place items onto his table while  sitting in his recliner.     Baseline: 01/08/24: Continue, carry goal over from recertification update last session. 12/27/2023: Pt. is able to reach out to efficiently reach out in front of him to place items on the table 90% of the time with the LUE, 10% of the time with the RUE 2/2  increased flexor tone. 10/23/23: Pt. Is improving with functional reaching with the left UE out in front of him to place items onto the table. Pt. has difficulty reaching out with the right hand to  place items on the table. 08/23/2023: Pt. has  difficulty consistently reaching out in front of him to place items on the on a tabletop surface with the right hand. 08/07/23: Continue 2/26/225: Continue 05/10/23: Pt. has difficulty reaching out in front of him far enough to efficiently place items on the table while sitting in a recliner chair.    Goal status: Continue/Ongoing     16. Pt. Will improve bilateral hand supination by 10 degrees to be able actively turn items in his hand to look at them independently and efficiently during ADLs/IADLs,    Baseline: 01/08/24: Continue, carry goal over from recertification update last session. 12/27/23: Right: Unable 2/2 increased tightness  Left: supination 20(38) 10/23/23: Right: unable, L: supination 30(38)    Goal status: Continue/Ongoing      17. Pt. Will independently, and efficiently transition between picking up finger foods for dipping, and  picking up utensils , then placing them down on the tabletop surface through 75% of the meal with minimal cues.    Baseline: 01/08/24: Continue, carry goal over from recertification update last session. 12/27/23: Pt. is able to consistently transition between picking up finger foods for dipping, and utensils consistently with the left hand with 100% accuracy time, and the right hand with 5% accuracy. 10/23/23: Pt. requires heavy cues, and assist to grasp finger foods, and utensils, and transition between the two.    Goal status:  Continue/Ongoing   ASSESSMENT:  CLINICAL IMPRESSION:  Pt. Was able to initiate using the drumsticks at the tabletop surface to form a beat pattern.  Pt. Was able to reach up with the RUE with assist, and cues required. Pt. Required assist to reposition the drumstick within the right hand. Pt. continues to present with flexor tightness, however was able to extend his digits more actively when preparing to grasp the objects. Multiple reps were performed for grasping and releasing the drumstick with the right hand. Pt.'s caregivers continue to be very supportive to continue working towards improving BUE functioning, ROM, strength, motor control, [facility] skills in order to maximize independence with daily ADLs, and IADL functioning. Plan to continue with the same goals into the next recertification period.   PERFORMANCE DEFICITS in functional skills including ADLs, IADLs, coordination, dexterity, proprioception, ROM, strength, pain, FMC, GMC, decreased knowledge of use of DME, and UE functional use, cognitive skills including safety awareness, and psychosocial skills including coping strategies, environmental adaptation, habits, and routines and behaviors.   IMPAIRMENTS are limiting patient from ADLs, IADLs, leisure, and social participation.   COMORBIDITIES may have co-morbidities  that affects occupational performance. Patient will benefit from skilled OT to address above impairments and improve overall function.  MODIFICATION OR ASSISTANCE TO COMPLETE EVALUATION: Maximum or significant modification of tasks or assist is necessary to complete an evaluation.  OT OCCUPATIONAL PROFILE AND HISTORY: Comprehensive assessment: Review of records and extensive additional review of physical, cognitive, psychosocial history related to current functional performance.  CLINICAL DECISION MAKING: High - multiple treatment options, significant modification of task necessary  REHAB POTENTIAL: Fair    EVALUATION  COMPLEXITY: High    PLAN: OT FREQUENCY: 2 x's a week  OT DURATION:12 weeks  PLANNED INTERVENTIONS: self care/ADL training, therapeutic exercise, therapeutic activity, neuromuscular re-education, manual therapy, passive range of motion, patient/family education, and cognitive remediation/compensation RECOMMENDED OTHER SERVICES: PT  CONSULTED AND AGREED WITH PLAN OF CARE: Patient and family member/caregiver  PLAN FOR NEXT SESSION: Review HEP   Richardson Otter, MS, OTR/L  02/19/24

## 2024-02-21 ENCOUNTER — Ambulatory Visit

## 2024-02-21 ENCOUNTER — Ambulatory Visit: Admitting: Occupational Therapy

## 2024-02-22 DIAGNOSIS — G4733 Obstructive sleep apnea (adult) (pediatric): Secondary | ICD-10-CM | POA: Diagnosis not present

## 2024-02-26 ENCOUNTER — Ambulatory Visit: Admitting: Occupational Therapy

## 2024-02-26 ENCOUNTER — Ambulatory Visit

## 2024-02-28 ENCOUNTER — Ambulatory Visit

## 2024-02-28 ENCOUNTER — Ambulatory Visit: Admitting: Occupational Therapy

## 2024-03-04 ENCOUNTER — Ambulatory Visit: Attending: Physician Assistant | Admitting: Occupational Therapy

## 2024-03-04 ENCOUNTER — Ambulatory Visit

## 2024-03-04 DIAGNOSIS — R278 Other lack of coordination: Secondary | ICD-10-CM | POA: Insufficient documentation

## 2024-03-04 DIAGNOSIS — M6281 Muscle weakness (generalized): Secondary | ICD-10-CM | POA: Diagnosis not present

## 2024-03-04 DIAGNOSIS — R269 Unspecified abnormalities of gait and mobility: Secondary | ICD-10-CM | POA: Insufficient documentation

## 2024-03-04 DIAGNOSIS — H543 Unqualified visual loss, both eyes: Secondary | ICD-10-CM | POA: Diagnosis not present

## 2024-03-04 DIAGNOSIS — R2689 Other abnormalities of gait and mobility: Secondary | ICD-10-CM | POA: Diagnosis not present

## 2024-03-04 DIAGNOSIS — R262 Difficulty in walking, not elsewhere classified: Secondary | ICD-10-CM | POA: Insufficient documentation

## 2024-03-04 DIAGNOSIS — I6930 Unspecified sequelae of cerebral infarction: Secondary | ICD-10-CM

## 2024-03-04 DIAGNOSIS — R2681 Unsteadiness on feet: Secondary | ICD-10-CM

## 2024-03-04 NOTE — Therapy (Addendum)
 . Occupational Therapy Neuro Treatment Note    Patient Name: Paul Hall MRN: [id number] DOB:[birth year], 27 y.o., male Today's Date: 03/04/2024  PCP: Dr. Bertell REFERRING PROVIDER: Dr. Bertell   .  OT End of Session - 03/04/24 1726     Visit Number [id number]    Number of Visits 180    Date for Recertification  03/20/24    OT Start Time 1540    OT Stop Time 1615    OT Time Calculation (min) 35 min    Activity Tolerance Patient tolerated treatment well    Behavior During Therapy WFL for tasks assessed/performed            Past Medical History:  Diagnosis Date   Diabetes mellitus (HCC)    Hypertension    Stroke [facility])    Past Surgical History:  Procedure Laterality Date   IVC FILTER PLACEMENT ([facility] HX)     LEG SURGERY     PEG TUBE PLACEMENT     TRACHEOSTOMY     Patient Active Problem List   Diagnosis Date Noted   Sepsis due to vancomycin  resistant Enterococcus species (HCC) 06/06/2019   SIRS (systemic inflammatory response syndrome) (HCC) 06/05/2019   Acute lower UTI 06/05/2019   VRE (vancomycin -resistant Enterococci) infection 06/05/2019   Anemia 06/05/2019   Skin ulcer of sacrum with necrosis of muscle (HCC)    Urinary retention    Type 2 diabetes mellitus without complication, with long-term current use of insulin  (HCC)    Tachycardia    Lower extremity edema    Acute metabolic encephalopathy    Obstructive sleep apnea    Morbid obesity with BMI of 60.0-69.9, adult (HCC)    Goals of care, counseling/discussion    Palliative care encounter    Sepsis (HCC) 04/27/2019   H/O insulin  dependent diabetes mellitus 04/27/2019   History of CVA with residual deficit 04/27/2019   Seizure disorder (HCC) 04/27/2019   Decubitus ulcer of sacral region, stage 4 (HCC) 04/27/2019   ONSET DATE: 01/2019  REFERRING DIAG: CVA/COVID-19  THERAPY DIAG:  Muscle weakness (generalized)  Other lack of coordination  Rationale for Evaluation and Treatment  Rehabilitation  SUBJECTIVE:    SUBJECTIVE STATEMENT:   Pt. eprorts that they weren't able to do too much ROM to his digits over the holiday week.    Pt accompanied by: self and family member  PERTINENT HISTORY:  Pt. is a 28 y.o. male who was diagnosed with COVID-19, and CVA with resultant quadriplegia. Pt. Was then hospitalized with VRE UTI. PMHx includes: urinary retention, seizure disorder, obstructive sleep disorder, DM Type II, Morbid obesity.   PRECAUTIONS: None  WEIGHT BEARING RESTRICTIONS No  PAIN:   Bilateral  digit PIP tightness discomfort/soreness-NR  FALLS: Has patient fallen in last 6 months? No  LIVING ENVIRONMENT: Lives with: lives with their family Lives in: House/apartment Stairs: No Level Entry Has following equipment at home: Wheelchair (power) and hoyer lift, sit to stand lift  PLOF: Independent  PATIENT GOALS: To be able to engage in more daily care tasks.  OBJECTIVE:   HAND DOMINANCE: Right  ADLs:  Transfers/ambulation related to ADLs: Eating: Pt. reports being able to hold standard utensils, and is starting to engage more in self-feeding tasks, hand to mouth patterns. Pt. Reports that he does as much as he can with the task, and family assists  with the remainder of the task. Grooming: Pt. is able to initiate holding an electric toothbrush, and brush his teeth. Family assists LB Dressing: Total  Assist UE dressing: Pt. is now able to reach up to actively assist with grasping , and pulling his gown down. Toileting:  Total Assist Bathing: MaxA UB, Total assist LB Tub Shower transfers: N/A Equipment: See above    IADLs: Shopping: Relies on family to assist Light housekeeping: Total Assist Meal Prep: Total Assist Community mobility:   Medication management:  Total Assist  Financial management: N/A Handwriting: Not legible: Pt. Is able to hold a pen with the left hand, and initiate marking the page. Pt.'s eye glasses were not  available  MOBILITY STATUS:  Power w/c  POSTURE COMMENTS:  Pt. Requires position changes in his power w/c  ACTIVITY TOLERANCE: Activity tolerance:  Fair  FUNCTIONAL OUTCOME MEASURES: FOTO: 40 TR score: 45  05/25/2022:   FOTO: 50 TR score: 45  07/20/22: FOTO 55  03/08/23: 51  UPPER EXTREMITY ROM     Active ROM Left eval Left  03/21/2022 Left 05/25/2022 L  07/20/22 Left 07/25/2022 Left  08/15/2022 Left  09/26/2022 Left  11/16/2022 Left 12/28/2022 Left 03/13/23 Left 05/10/23 Left 10/23/23 Left 12/27/23  Shoulder flexion 119 123 125 130  136 138 130 142 144 144 148 148  Shoulder abduction 110 115 115 115  118 121 125 142 140 140 140 140  Shoulder adduction               Shoulder extension               Shoulder internal rotation               Shoulder external rotation               Elbow flexion 135(135) 145 145 145  145    145 145  145  Elbow extension -27(-18) -21(-20) -20(-16)  -25(-10) -23(-10) -21(-10) -20(-18) -20(-18) -20(-17) -17(-16) -12(-8) -12(-6)  Wrist flexion               Wrist extension 10(50) 19(50) 28(50)  30(50) 35(55) 36(55) 36(60) 40(60) 45(60) 45(60) 42(54) 36(54)  Wrist ulnar deviation               Wrist radial deviation               Wrist pronation            60(74) 84(84)  Wrist supination   Limited by flexor tone       10(15)  30(38) 20(38)  (Blank rows = not tested)    Active ROM Right eval Right 03/21/2022 Right 05/25/2022 R  07/20/22 Right  07/25/22 Right 08/15/2022 Right 09/26/2022 Right 11/16/2022 Right 12/28/2022 Right 03/13/23 Right 05/10/23 Right 10/23/23 Right  9/24 /25  Shoulder flexion 106 scaption 105  Scaption 62 flexion 110 scaption 100  105 105 105 112 Scaption 123 Scaption 122  Scaption 142 Scaption 142  Shoulder abduction 114 90 97 100  105 117 120 124 124 122 144 146  Shoulder adduction               Shoulder extension               Shoulder internal rotation               Shoulder external rotation                Elbow flexion 120(130) 145 145 140  144 140 142 148 148 145  145  Elbow extension -45(-35) -22(-35) -28(-22)  -32(-21) -32(-28) -28(-26) -28(-28) -27(-26) -27(-40) -38(-35) -34(-26) -34(-26)  Wrist flexion               Wrist extension -30(10) -25(10) -10(30)  -10(20) -10(25) -20(40) -30(10) -22(34) -22(35) -32(22) -30(-8) -38(-6)  Wrist ulnar deviation               Wrist radial deviation               Wrist pronation               Wrist supination   Limited by flexor tone                 12/28/2022: Thumb radial abduction: Right: 12(46), Left: 40(54)  Digits: Bilateral 2nd through 5th digit: MP Hyperextension with PIP, an DIP flexion    10/23/23:  Bilateral 2nd through 5th digit MP hyperextension with PIP, and DIP flexion: Pt. has been intermittently able to initate active diigt PIP, and DIP extension following Botox during this past recertification/progress reporting period.     UPPER EXTREMITY MMT:     Left eval Left 03/21/2022 Left  05/25/2022 L 07/20/22 Left  08/15/2022 Left 09/26/2022 Left 11/16/2022 Left 12/28/2022 Left  03/13/2023 Left  05/10/23 Left 10/23/23 Left 12/27/23  Shoulder flexion 3-/5 3/5 3+/5 4+/5 4+/5 4+/5 4+/5 4+/5 4+/5 4+/5 4+/5 4+/5  Shoulder abduction 3-/5 3-/5 4-/5 4+/5 4+/5 4+5 4+/5 4+/5 4+/5 4+/5 4+/5 4+/5  Shoulder adduction              Shoulder extension              Shoulder internal rotation              Shoulder external rotation              Elbow flexion 3/5 3+/5 4-/5 4+/5 5/5 5/5 5/5 5/5 5/5 5/5 5/5 5/5  Elbow extension 2-/5 2/5  4+/5 4+/5 4+/5  4/5 4/5 4/5 through available range 4+/5 through available range 4+/5  Wrist flexion              Wrist extension 2/5 2+/5 3-/5  3-/5 3-/5 3-/5 3/5 3/5 3/5 3+/5 3-/5  Wrist ulnar deviation              Wrist radial deviation              Wrist pronation              Wrist supination              (Blank rows = not tested)  Right eval Right 03/21/2022 Right  05/25/2022 R 07/20/22 Right 08/15/2022  Right 09/26/2022 Right 11/16/2022 Right 12/28/2022 Right 03/13/2023 Right 05/10/23 Right 10/23/23 Right 12/27/23  Shoulder flexion 3-/5 scaption 3-/5 3-/5 3+/5 3+/5 3+/5 3/5 3/5 3/5 3+/5 4-/5 4/5  Shoulder abduction 3-/5 3-/5 3-/5 4/5 4/5 4/5 4/5 4/5 4/5 4/-5 4-/5 4/5  Shoulder adduction              Shoulder extension              Shoulder internal rotation              Shoulder external rotation              Elbow flexion 3/5 3/5 3+/5 4/5 4+/5 4+/5 4+/5 4+/5 4+/5 4+/5 4+/5 5/5  Elbow extension 2-/5 2/5 3-/5 4+/5 4+/5 4+/5 4+/5 4-/5 4-/5 4-/5 4-/5 through available range 3-/5  Wrist flexion  Wrist extension 2-/5 2-/5 2/5 2/5 2/5 2/5 2/5 2/5 2/5 2/5 2/5 2-/5  Wrist ulnar deviation              Wrist radial deviation              Wrist pronation              Wrist supination                HAND FUNCTION:  Grip strength: Right: 0 lbs; Left: 0 lbs and Lateral pinch: Right: 5 lbs, Left: 2 lbs  03/21/2022: Lateral pinch: Right: 3.5 lbs, Left: 2 lbs  07/20/22: Grip strength: Right: 3 lbs; Left: 4 lbs and Lateral pinch: Right: 5 lbs, Left: 3 lbs  08/15/2022:  Grip strength: Right: 0 lbs; Left: 5 lbs and Lateral pinch: Right: 2 lbs, Left: 4 lbs  09/26/2022  Grip strength: Right: 0 lbs; Left: 8 lbs and Lateral pinch: Right: 2 lbs, Left: 5 lbs  11/16/2022  Grip strength: Right: 0 lbs; Left: 8 lbs  9/25/20204  Grip strength: Right: 0 lbs; Left: 8 lbs  Grip strength: Right: 0 lbs; Left: 8 lbs   03/08/23:  Grip strength: Right: 0 lbs without PROM, 5 lbs after PROM, Left 8 lbs  05/10/23:  Grip strength: TBD  Bilateral digit PIP/DIP flexion contractures with MP hyperextension with attempts for AROM. Pt. is able to tolerate AROM to the bilateral digits at the initial evaluation however, has a history of pain in the digits.  COORDINATION: Eval: Pt. is unable to grasp 9-hole test pegs. Pt. is able to initiate grasping larger pegs, and is able to hold a pen in the left  hand.  07/20/22: 2 min 36 seconds to remove 9 pegs from 9 hole peg test - cues to locate pegs 2/2 low vision. Pt. is able to initiate grasping larger pegs on R hand and is able to hold a pen in the left hand.  08/15/2022:    Vision, and sensation limiting accuracy of 9 hole peg test results. Pt. Was able to grasp and remove vertical pegs intermittently with cues.    05/10/23: TBD  SENSATION: Light touch: Impaired   EDEMA:  N/A  MUSCLE TONE: BUE flexor Spasticity  COGNITION Overall cognitive status: Continue to assess in functional context  VISION:   Subjective report: Pt. was not wearing glasses at the time of the initial eval.  Baseline vision: Vision is very limited. Wears glasses all the time Visual history: History of impaired vision following CVA. Pt. Has received treatment through the [facility] low vision rehabilitation program.   VISION ASSESSMENT: Impaired To be further assessed in functional context  PERCEPTION: Impaired   PRAXIS: Impaired: motor planning  OBSERVATIONS:  Pt reports being on Tramadol   TODAY'S TREATMENT 03/04/24  Moist heat modality to the RUE, and bilateral hands in conjunction with ROM  Manual Therapy:  -Pt. tolerated STM to the volar dorsal, lateral, and medial aspect of the bilateral digit MCP, PIP, and DIPs   Therapeutic Ex.:    -BUE tolerated PROM was performed with emphasis placed on slow prolonged gentle stretching of the Digit MCP/PIP/DIPs of bilateral hands in conjunction with moist heat modality 2/2 flexor tone, tightness, and stiffness.  PATIENT EDUCATION: Education details: ROM, BUE tone  Person educated: Patient and Parent Education method: Explanation Education comprehension: verbalized understanding  HOME EXERCISE PROGRAM:    Incorporating  the right hand during self-feeding tasks.   GOALS: Goals reviewed with patient? Yes  SHORT  TERM GOALS: Target date: 12/05/2023   To assess splint fit, and make appropriate adjustments to  promote good skin integrity through the palmar surface of the bilateral hands.  Baseline: 05/25/22: Goal currently met, however ongoing as needs to assess splint fit arise. 03/23/2022: Pt. is wearing splints a couple of hours at night bilateral resting hand splints. 03/21/2022: Pt. is wearing splints a couple of hours at night bilateral resting hand splints. Goal status: Deferred   LONG TERM GOALS: Target date: 03/20/2024   FOTO score will Improve by 2 points for Pt. perceived improvement with the assessment specific ADL/IADL tasks.  Baseline: 03/08/23: 51; 12/28/2022: FOTO score: 56; 11/16/2022: 52 09/27/2022: 51 07/20/2022: FOTO 55 05/25/2022: FOTO score: 50,  TR score: 45 Eval: FOTO score: 40, TR score: 45 Goal status: Achieved   2.   Pt. will independently perform oral care for 100% of the task after complete set-up. Baseline: 05/10/23: Pt. Performs 90% of the task. Pt.'s mother assists with 10% of the task for thorough cleaning the hard to reach places way in the back of the oral cavity.03/08/23: not as much assist required proximally, but to adjust positioning of toothbrush; 02/13/23: Pt. Continues to require assist proximally 01/09/2023: Continue 12/28/2022:  Pt. Continues to complete 90% of the task with complete set-up, with visual cues, and occassional assist of the right elbow. 11/16/2022: Pt. Completes 90% of the task with complete set-up. 09/26/2022: Completes 90% of the task, proximal assist at times required at the elbow.  08/15/2022: Completes 90% of the task, proximal assist at times required at the elbow. 07/20/22: completes 90% of task, limited by shoulder flexion. 05/25/2022:  Pt. Is able to initiate and perform oral care for approximately 90% of the task. Complete set-up required. Assi needed only for the very back teeth. 03/23/2022: Pt. Is able to initiate and perform oral care for approximately 75% of the task. Pt. Requires assist at proximally at the elbow for through oral care. 03/21/2022: Pt.  Is able to initiate and perform oral care for approximately 75% of the task. Pt. Requires assist at proximally at the elbow for through oral care. Eval: Pt. is able to initiate using an electric toothbrush. Pt. requires assist for set-up, and assist for thoroughness, and as he Pt. fatigues. Goal status:  Partially met, Goal discontinued  3.  Pt. Will be modified independence with self-feeding for 100% using a swivel spoon, and standard fork after complete set-up Baseline: 05/10/2023: Pt. feeds self 95-100% of the meal. with occasional assist to reposition the utensil. 03/08/23: indep with cup with a handle and lid, assist to reposition eating utensils in hand;  02/13/23:  Pt. Is now able to consistently, and efficiently use a swivel spoon for eating cereal with the left hand.01/09/2023: Continue 12/28/2022: Pt. Is now feeding himself 90% of the time using a swivel spoon with the left hand, and 95% of the time with the fork. Pt. Continues to have difficulty with cutting food. 11/16/2022: 100% with finger foods, Pt. Is initiating with a swivel spoon, and universal cuff for fork use, however requires assist for consistency, and accuracy.  09/26/2022: 90% using the spoon, assist at time with the forearm motion, and grip on the utensils. 100% for finger foods.  08/15/2022:  90% using the spoon, assit at time with the forearm motion, and grip on the utensils. 100% for finger foods-independent with set-up- unsupervised.  07/20/22: self-feeds cereal using spoon 90% of task. 05/25/2022: Pt. Is able to use a spoon to scoop cereal  when feeding himself cereal 85% of the time. Pt. Is able to feed himself snack/finger foods 100% of the time. Pt. Continues to work on consistency of  stabilizing a cup/mug when drinking. Pt. Is able to grasp a water  bottle with assist initially, with assist tapering off as he drinks.03/23/2022: Pt. Is able to perform scooping cereal for 75% of the time. Pt. required assist, and support at the left  elbow, and Pt. Presents with limited forearm supination when using the spoon, and bringing it towards his mouth. Pt. Is able to use a fork to spear items, and perform the hand to mouth pattern.  03/21/2022: Pt. Is able to perform scooping cereal for 75% of the time. Pt. required assist, and support at the left elbow, and Pt. presents with limited forearm supination when using the spoon, and bringing it towards his mouth. Pt. Is able to use a fork to spear items, and perform the hand to mouth pattern.  Eval: Pt. is able to hold standard standard utensils. Pt. Performs as much of the task as he, can and has assistance for the remainder. Goal status:  Improved, Achieved  4.  Pt. will improve grasp patterns and consistently grasp 1/4 objects for ADL, and IADL tasks.  Baseline: 01/08/24: Continue, carry goal over from recertification update last session. 12/27/23: Pt. is able to present with increased tightness  2/2 being way overdue for Botox injections, which limits consistency with grasping small objects. 10/23/2023: Pt. is able to initiate grasping small objects visual cues, and increased time, however continues to work towards grasping 1/4 objects efficiently. 08/27/23: Pt. is able to grasp flat discs, and sustain a secure hold on them while moving the UE through various planes. 08/07/23: Continue 05/31/2023: Continue 05/10/23: Pt. is consistently able to grasp 1/2 objects with visual cues. Pt. Requires assist, and increased visual cues to grasp 1/4 objects on an elevated plane.  Pt. Is unable to grasp the flat objects at the tabletop surface. Pt. Is grasping grasping small puppy vitamins.03/08/23: 1/2 objects efficiently; 02/13/2023: 1/2 objects efficiently 01/09/2023: Continue 12/28/2022: Continues to grasp 1/4 pegs with min a + visual cues, consistently grasping 1/2 objects with visual cues 11/16/2022: Continues to grasp 1/4 pegs with min a + visual cues, consistently grasping 1/2 objects with visual cues  09/26/2022: Continues to grasp 1/4 pegs with min a + visual cues, consistently grasping 1/2 objects with visual cues. 08/15/2022: grasps 1/4 pegs with min a + visual cues, consistently grasping 1/2 objects with visual cues. 07/20/22: grasps 1/4 pegs with min a + visual cues, consistently grasping 1/2 objects with visual cues. 05/25/2022: Pt. Is working on improving consistency of grasping 1/2 objects with visual cues.  03/23/2022: Pt. Is able to grasp 1 objects consistently,and continues to work on the hand patterns needed to grasp 1/2 objects.03/21/2022: Pt. Is able to grasp 1 objects consistently,and continues to work on the hand patterns needed to grasp 1/2 objects. Eval: Pt. is able to grasp 1 objects intermittently using a lateral grasp pattern. Goal status: Ongoing  5.  Pt. will independently write his name legibly with letter sizes under 1. Baseline: 05/10/23: Deferred 03/08/23: Not addressed this period; 02/13/2023: Continue 01/09/2023: Continue. 12/28/2022: Pt. is now using an IPad Pen. 11/16/2022: Pt. Continues to be able to write name with smaller letter size. Pt. Is signing name with a computer styus. Pt. Requires visual cues, and assist 09/26/2022: Pt. Continues to be able to write name with smaller letter size. Pt. Is signing name with a  computer styus. Pt. Requires visual cues, and assist. 08/15/2022: Pt. Is able to write name with smaller letter size. Pt. Is signing name with a computer styus. Pt. Requires visual cues, and assist. 07/20/22: stabilizing assist to write name with 75% legibility with 2 letters. 05/25/2022: Pt. to continue to work towards formulating grasp patterns in preparation for grasping a large width pen. Pt. Requires visual cues. 03/23/2022: Pt. Is able to write his name with modA, however has difficulty with formulating letter sizes less than 2 in size with 50% legibility for the 3 letters of his name.03/21/2022: Pt. Is able to write his name with modA, however has  difficulty with formulating letter sizes less than 2 in size with 50% legibility for the 3 letters of his name. Eval: Pt is able to hold a thin marker with his left hand, and formulate a line, and initiate a circular pattern (Pt. without glasses today) Goal status: Deferred  6. Pt. Will reach up to comb/brush his hair with minA.  Baseline: 05/10/23: Pt. is able to use a pick independently with the left hand with occasional assist to the far side of the left his head. 03/08/23: Pt uses pick in L hand to reach 75% of his head; unable to reach R side of head; 02/13/2023:Pt. is able to use a hair pick with the left hand on the right side of his head, top, and back of the head. Pt. Continues to have difficulty reaching further to the right side of his head with the left. 01/09/2023: Continue 12/28/2022: Pt. Is progressing, and is now able to use a hair pick with the left hand on the right side of his head, top, and back of the head. Pt. Continues to have difficulty reaching further to the right side of his head with the left. 11/16/2022: Pt. Is able to use a hair pick for the left side of his head using his left hand. Pt. Presents with difficulty reaching to the right side of his head.09/26/2022: Pt. Is able to use a hair pick for the left side of his head using his left hand. Pt. Presents with difficulty reaching to the right side of his head. 5/13/202: 75% using a hair pick, assist to the right side of the head. 07/20/22: reaches 75% of head, assist for far R side of head, fatigues quickly. 05/25/2022: Pt. Is able to reach up with the left hand to the left side, top, and back of his head. 03/23/2022: Pt. is now able to more consistently initiate reaching up to his head with his left hand in preparation for haircare12/18/2023: Pt. is now able to more consistently initiate reaching up to his head with his left hand in preparation for haircare. Eval: Pt. is able to initiate reaching up for hair care with a long handled  brush, however is unable to sustain UE's in elevation to perform the task.     Goal status: Achieved  7. Pt. Will independently navigate the w/c through his environment with minA with visual scanning, and hand placement on the controls.  Baseline: 11/16/2022: Deferred 2/2 vision 09/26/2022: Pt. Is now navigating his w/c ouside the home, and around his block. 08/15/2022: Pt. Requires minA to setup hand on controls and MIN cues to navigate the w/c in wide spaces, requires MIN - MOD cues to navigate the w/c through more narrow doorways, and tighter turns - varies based on good vs bad vision days.07/20/22: Pt. Requires minA to setup hand on controls and MIN cues to navigate  the w/c in wide spaces, requires MIN - MOD cues to navigate the w/c through more narrow doorways, and tighter turns - varies based on good vs bad vision days. 05/25/2022: Pt. Requires minA  and  cues to navigate the w/c in wide spaces, and requires Mod cues to navigate the w/c through more narrow doorways, and tighter turns. Pt. Requires max cues for scanning through the environment, and moderate cues for hand placement on the controls.     Goal status: Deferred 2/2 vision    8. Pt. Will improve bilateral grip strength to be able to independently grasp, and pull up blankets, and linens.   Baseline: 05/10/23: Deferred 03/08/23: R grip 0-5 lbs, L grip 8 lbs; pt reports he can consistently pull up blankets and linens independently.  02/13/2023: Continue. Pt. presents with digit PIP, and DIP flexor tightness. 01/09/2023: Continue 12/28/2023: R: 0#, L: 5# Pt. Is now able to more consistently grasp and pull blankets up over him. 11/16/2022: R: 0 L: 8# Pt. Is more consistently grasping his blankets and attempting to pull them up. 09/26/2022: R: 0  L: 8# Pt. is attempting to grasp, and pull blankets up more, using mostly the left hand. 08/15/2022: Pt. has difficulty securely holding, and pulling up blankets, and linens.    Goal status:Deferred  9. Pt. will  consistently actively control the releasing of blankets, covers, and linens from his hands once they are in the desired position over him. Baseline: 12/28/2022: Pt. Is consistently actively able to release linens once they are in the desired position over him. 11/16/2022: Pt. continues to improve with actively releasing blankets form his hands. 09/26/2022: Pt. is improving with actively releasing blankets form his hands. 08/15/2022: Pt. has difficulty with controlled releasing of blankets/linens from his grasp.     Goal status: Achieved    10. Pt. Will improve bilateral UE strength by 2 MM grades to assist with ADLs, and IADLs  Baseline: 01/08/24: Continue, carry goal over from recertification update last session. 12/27/23:  Right: shoulder flexion: 4/5, abduction: 4/5, elbow flexion: 5/5 , extension: 3-/5 wrist extension: 2-/5; Left: shoulder flexion: 4+/5, abduction: 4+/5, elbow flexion: 5/5 , extension: 4+/5  wrist extension: 3+/5  08/27/23:  Right: shoulder flexion: 3+/5, abduction: 4-/5, elbow flexion: 4+/5 , extension: 4-/5 wrist extension: 2/5; Left: shoulder flexion: 4+/5, abduction: 4+/5, elbow flexion: 5/5 , extension: 4+/5  wrist extension: 3-/5  10/23/2023: Right: shoulder flexion: 4-/5, abduction: 4-/5, elbow flexion: 4+/5 , extension: 4-/5 wrist extension: 2/5; Left: shoulder flexion: 4+/5, abduction: 4+/5, elbow flexion: 5/5 , extension: 4+/5  wrist extension: 3+/5  08/27/23:  Right: shoulder flexion: 3+/5, abduction: 4-/5, elbow flexion: 4+/5 , extension: 4-/5 wrist extension: 2/5; Left: shoulder flexion: 4+/5, abduction: 4+/5, elbow flexion: 5/5 , extension: 4/5  wrist extension: 3/5  08/07/23: Continue 05/31/2023: Continue 05/10/23: Right: shoulder flexion: 3+/5, abduction: 4-/5, elbow flexion: 4+/5 , extension: 4-/5 wrist extension: 2/5; Left: shoulder flexion: 4+/5, abduction: 4+/5, elbow flexion: 5/5 , extension: 4/5  wrist extension: 3/5 03/13/2023: Right: shoulder flexion: 3/5, abduction: 4/5, elbow  flexion: 4+/5 , extension: 4-/5 wrist extension: 2/5; Left: shoulder flexion: 4+/5, abduction: 4+/5, elbow flexion: 5/5 , extension: 4/5  wrist extension: 3/5 03/08/23: NT this date d/t as pt arrived late; 02/13/2023: Continue 01/09/2023: Continue 12/28/2022: Right: shoulder flexion: 3/5, abduction: 4/5, elbow flexion: 4+/5 , extension: 4-/5 wrist extension: 2/5; Left: shoulder flexion: 4+/5, abduction: 4+/5, elbow flexion: 5/5 , extension: 4/5  wrist extension: 3/5 Right: shoulder flexion: 3+/5, abduction: 4/5, elbow flexion:  4+/5 , extension: 4+/5 wrist extension: 2/5; Left: shoulder flexion: 4+/5, abduction: 4+/5, elbow flexion: 5/5 , extension: 4+/5 wrist extension: 3-/5 11/16/2022: Right: shoulder flexion: 3/5, abduction: 4/5, elbow flexion: 4+/5 , extension: 4+/5 wrist extension: 2/5; Left: shoulder flexion: 4+/5, abduction: 4+/5, elbow flexion: 5/5 , extension:  wrist extension: 3-/5 Right: shoulder flexion: 3+/5, abduction: 4/5, elbow flexion: 4+/5 , extension: 4+/5 wrist extension: 2/5; Left: shoulder flexion: 4+/5, abduction: 4+/5, elbow flexion: 5/5 , extension: 4+/5 wrist extension: 3-/5 Goal status: Ongoing  11. Pt. will improve right wrist extension by 10 degrees to initiate reaching for items in preparation for ADLs. Baseline:01/08/24: Continue, carry goal over from recertification update last session.  12/27/23: Right wrist extension: -38(-6) 10/23/23: -30(-8) 08/23/2023: R Wrist Ext: -31(-8)  08/07/23: Continue 05/31/23: Continue 05/10/23: Right wrist extension: -32(22) 03/13/23: -22(35) 03/08/23: NT this date as pt arrived late; 02/13/2023: Continue 01/09/2023: Continue 12/28/2022: -22(34) 11/16/2022: -30(10)    Goal status: Ongoing      12. Pt. Will require donn/doff a Tank top T-shirt efficiently with supervision and full set-up.    Baseline: 01/08/24: Continue, carry goal over from recertification update last session. 12/27/23: Independent doffing, MaxA doffing 10/23/23: Independent doff the  shirt/tank, MaxA  donning. 10/23/23: Pt. Is able to independently doff his shirt, requiring assist to lean forward and loosen the back. 08/07/23: Continue 05/31/2023: Pt. Requires maxA donning a pullover sweatshirt, Mod-maxA doffing it. T-shirt tank TBD 05/10/23: Pt. Presents with difficulty completing, and requires increased time to complete    Goal status: Ongoing    13. Pt. Will independently perform bilateral/bimanual hand tasks needed to fold towels/linens/laundry Baseline: 01/08/24: Continue, carry goal over from recertification update last session. 12/27/23: Pt. Has not been able to work on the this  2/2 multiple cancellations this recertification 2/2 illness, familiy health issues,a nd transportation. 10/23/23: Pt. Continues to have difficulty folding laundry, and linens. 08/23/23: Pt. Continues to have difficulty using bilateral/bimanual hand tasks needed to fold towels/linens/laundry. 08/07/23: Continue 05/31/23: Continue 05/10/23:  Pt. Has difficulty folding towels/lines/laundry using bilateral hands Goal status:Ongoing  14. Pt. Will independently, and efficiently reach his right arm over the side of the chair to pet his dog.    Baseline: 08/23/2023: Pt. Is now able to reach over the side of the armrest of his recliner chair to pet his dog.  08/07/23: Continue 05/31/2023: Continue 05/10/23: Pt. is unable to efficiently reach his right UE over the side of his recliner chair to pet his dog.    Goal status: Achieved    15. Pt. Will independently, and efficiently reach out in front of him to efficiently place items onto his table while sitting in his recliner.     Baseline: 01/08/24: Continue, carry goal over from recertification update last session. 12/27/2023: Pt. is able to reach out to efficiently reach out in front of him to place items on the table 90% of the time with the LUE, 10% of the time with the RUE 2/2  increased flexor tone. 10/23/23: Pt. Is improving with functional reaching with the left UE out  in front of him to place items onto the table. Pt. has difficulty reaching out with the right hand to  place items on the table. 08/23/2023: Pt. has difficulty consistently reaching out in front of him to place items on the on a tabletop surface with the right hand. 08/07/23: Continue 2/26/225: Continue 05/10/23: Pt. has difficulty reaching out in front of him far enough to efficiently place items on the table while  sitting in a recliner chair.    Goal status: Continue/Ongoing     16. Pt. Will improve bilateral hand supination by 10 degrees to be able actively turn items in his hand to look at them independently and efficiently during ADLs/IADLs,    Baseline: 01/08/24: Continue, carry goal over from recertification update last session. 12/27/23: Right: Unable 2/2 increased tightness  Left: supination 20(38) 10/23/23: Right: unable, L: supination 30(38)    Goal status: Continue/Ongoing      17. Pt. Will independently, and efficiently transition between picking up finger foods for dipping, and  picking up utensils , then placing them down on the tabletop surface through 75% of the meal with minimal cues.    Baseline: 01/08/24: Continue, carry goal over from recertification update last session. 12/27/23: Pt. is able to consistently transition between picking up finger foods for dipping, and utensils consistently with the left hand with 100% accuracy time, and the right hand with 5% accuracy. 10/23/23: Pt. requires heavy cues, and assist to grasp finger foods, and utensils, and transition between the two.    Goal status: Continue/Ongoing   ASSESSMENT:  CLINICAL IMPRESSION:  Pt. reports soreness and tenderness in the bilateral hand digit PIPs 2/2 tightness, and stiffness with increased soreness at the left 3rd digit PIP.  Pt. was able to tolerate the moist heat modality, ROM, and STM. Pt.'s caregivers continue to be very supportive to continue working towards improving BUE functioning, ROM, strength, motor  control, [facility] skills in order to maximize independence with daily ADLs, and IADL functioning. Plan to continue with the same goals into the next recertification period.   PERFORMANCE DEFICITS in functional skills including ADLs, IADLs, coordination, dexterity, proprioception, ROM, strength, pain, FMC, GMC, decreased knowledge of use of DME, and UE functional use, cognitive skills including safety awareness, and psychosocial skills including coping strategies, environmental adaptation, habits, and routines and behaviors.   IMPAIRMENTS are limiting patient from ADLs, IADLs, leisure, and social participation.   COMORBIDITIES may have co-morbidities  that affects occupational performance. Patient will benefit from skilled OT to address above impairments and improve overall function.  MODIFICATION OR ASSISTANCE TO COMPLETE EVALUATION: Maximum or significant modification of tasks or assist is necessary to complete an evaluation.  OT OCCUPATIONAL PROFILE AND HISTORY: Comprehensive assessment: Review of records and extensive additional review of physical, cognitive, psychosocial history related to current functional performance.  CLINICAL DECISION MAKING: High - multiple treatment options, significant modification of task necessary  REHAB POTENTIAL: Fair    EVALUATION COMPLEXITY: High    PLAN: OT FREQUENCY: 2 x's a week  OT DURATION:12 weeks  PLANNED INTERVENTIONS: self care/ADL training, therapeutic exercise, therapeutic activity, neuromuscular re-education, manual therapy, passive range of motion, patient/family education, and cognitive remediation/compensation RECOMMENDED OTHER SERVICES: PT  CONSULTED AND AGREED WITH PLAN OF CARE: Patient and family member/caregiver  PLAN FOR NEXT SESSION: Review HEP   Richardson Otter, MS, OTR/L  03/04/24

## 2024-03-04 NOTE — Therapy (Signed)
 OUTPATIENT PHYSICAL THERAPY NEURO TREATMENT  Patient Name: Paul Hall MRN: [id number] DOB:01-28-1996, 28 y.o., male Today's Date: 03/05/2024   PCP:  Paul Sous, PA-C  REFERRING PROVIDER: Morene Sous, PA-C  END OF SESSION:  PT End of Session - 03/04/24 1638     Visit Number [id number]    Number of Visits 200   date corrected from most recent recert   Date for Recertification  03/18/24    Authorization Type [employer] [place] [employer]    Authorization Time Period 01/04/21-03/29/21; Recert 03/24/2021-06/16/2021; Recert 09/15/2021- 12/08/2021; Recert 12/13/2021-03/07/2022    Progress Note Due on Visit 190    PT Start Time 1617    PT Stop Time 1659    PT Time Calculation (min) 42 min    Equipment Utilized During Treatment Gait belt    Activity Tolerance Patient tolerated treatment well    Behavior During Therapy WFL for tasks assessed/performed              Past Medical History:  Diagnosis Date   Diabetes mellitus (HCC)    Hypertension    Stroke (HCC)    Past Surgical History:  Procedure Laterality Date   IVC FILTER PLACEMENT ([facility] HX)     LEG SURGERY     PEG TUBE PLACEMENT     TRACHEOSTOMY     Patient Active Problem List   Diagnosis Date Noted   Sepsis due to vancomycin  resistant Enterococcus species (HCC) 06/06/2019   SIRS (systemic inflammatory response syndrome) (HCC) 06/05/2019   Acute lower UTI 06/05/2019   VRE (vancomycin -resistant Enterococci) infection 06/05/2019   Anemia 06/05/2019   Skin ulcer of sacrum with necrosis of muscle (HCC)    Urinary retention    Type 2 diabetes mellitus without complication, with long-term current use of insulin  (HCC)    Tachycardia    Lower extremity edema    Acute metabolic encephalopathy    Obstructive sleep apnea    Morbid obesity with BMI of 60.0-69.9, adult (HCC)    Goals of care, counseling/discussion    Palliative care encounter    Sepsis (HCC) 04/27/2019   H/O insulin  dependent diabetes mellitus 04/27/2019    History of CVA with residual deficit 04/27/2019   Seizure disorder (HCC) 04/27/2019   Decubitus ulcer of sacral region, stage 4 (HCC) 04/27/2019    ONSET DATE: October 2020  REFERRING DIAG: Cerebral infarction, unspecified   THERAPY DIAG:  Muscle weakness (generalized)  Other lack of coordination  Difficulty in walking, not elsewhere classified  History of CVA with residual deficit  Unsteadiness on feet  Other abnormalities of gait and mobility  Abnormality of gait and mobility  Rationale for Evaluation and Treatment: Rehabilitation  SUBJECTIVE:  SUBJECTIVE STATEMENT: Patient still recovering from not feeling well- states feeling very tight and cold. Requesting heat if available and to stay in chair today.    Pt accompanied by: family member  PERTINENT HISTORY: Paul Hall is a 28 y/o male who presents with severe weakness, quadriparesis, altered sensorium, and visual impairment s/p critical illness and prolonged hospitalization. Pt hospitalized in October 2020 with ARDS 2/2 COVID19 infection. Pt sustained a complex and lengthy hospitalization which included tracheostomy, prolonged sedation, ECMO. In this period pt sustained CVA and SDH. Pt has now been liberated from tracheostomy and G-tube. Pt has since been hospitalized for wound infection and UTI. Pt lives with parents at home, has hospital bed and left chair, hoyer lift transfers, and power WC for mobility needs. Pt needs heavy physical assistance with ADL 2/2 BUE contractures and motor dysfunction   PAIN:  Are you having pain? No  PRECAUTIONS: Fall  RED FLAGS: None   WEIGHT BEARING RESTRICTIONS: No  FALLS: Has patient fallen in last 6 months? No  LIVING ENVIRONMENT: Lives with: lives with their family Lives in:  House/apartment Stairs:  Has following equipment at home: Wheelchair (power) and hospital bed, hoyer lift, Sit to stand lift  PLOF: Independent  PATIENT GOALS: I want to walk  OBJECTIVE:  Note: Objective measures were completed at Evaluation unless otherwise noted.  DIAGNOSTIC FINDINGS: CLINICAL DATA:  Altered mental status.  History of COVID and stroke.   EXAM: CT HEAD WITHOUT CONTRAST   TECHNIQUE: Contiguous axial images were obtained from the base of the skull through the vertex without intravenous contrast.   COMPARISON:  CT head 11/11/2012   FINDINGS: Brain: Left cerebral convexity cortical hypodensity with associated cortical calcification extending from the frontal to the parietal lobe. Extensive area of volume loss and low-density compatible with chronic infarction. This is noted on prior reports from [facility]. Probable chronic watershed infarct. Additional small areas of hypodensity in the right frontal white matter consistent with chronic ischemia.   Negative for acute infarct. No acute hemorrhage or mass. Ventricle size normal without midline shift.   Vascular: Normal arterial flow voids.   Skull: Negative   Sinuses/Orbits: Mucosal edema paranasal sinuses with air-fluid level left sphenoid sinus. Negative orbit   Other: None   IMPRESSION: Extensive watershed type infarct over the left cerebral convexity with cortical calcification. This appears chronic. No associated hemorrhage, hydrocephalus, or midline shift.   These results were called by telephone at the time of interpretation on 05/09/2019 at 3:12 pm to provider Paul Hall , who verbally acknowledged these results.     Electronically Signed   By: [name] M.D.   On: 05/09/2019 15:13  COGNITION: Overall cognitive status: Impaired   SENSATION: Light touch: WFL  COORDINATION: Delayed with BLE  EDEMA:  None observed  MUSCLE TONE: RLE: Moderate  POSTURE: rounded shoulders and  forward head  LOWER EXTREMITY ROM:     Active  Right Eval Left Eval  Hip flexion    Hip extension    Hip abduction    Hip adduction    Hip internal rotation    Hip external rotation    Knee flexion    Knee extension    Ankle dorsiflexion    Ankle plantarflexion    Ankle inversion    Ankle eversion     (Blank rows = not tested)  LOWER EXTREMITY MMT:    MMT Right Eval Left Eval  Hip flexion    Hip extension    Hip abduction  Hip adduction    Hip internal rotation    Hip external rotation    Knee flexion    Knee extension    Ankle dorsiflexion    Ankle plantarflexion    Ankle inversion    Ankle eversion    (Blank rows = not tested)  BED MOBILITY:  Findings: Rolling to Right Max A Rolling to Left Max A  TRANSFERS: Sit to stand: Max A x 2 - just able to clear gluteals off mat  Assistive device utilized: HHA x2 to stand- dependent on hoyer for actual transfers     Stand to sit: Max A  Assistive device utilized: HHA x 2        GAIT: Findings: Non-Ambulatory  FUNCTIONAL TESTS:  None appropriate at this time  PATIENT SURVEYS:                                                                                                                                TREATMENT DATE: 03/04/2024  *Patient requested to stay in power w/c today for session.   Therex:  Extensive passive stretching/ROM to both LE today- starting with ankles- DF/PF/IV/EV/Circles then knee ext/flex, Hip IR/ER/flex/ext/abd/add/circles - sm diameter.  Knee flex (manual resist) 2 x 10 each LE Hip add (manual resist) 2 x 10 (5 sec hold) each LE Hip Abd active 2 x 10 each LE AROM-straight leg hip IR 2 x 10 reps each LE Quad sets (hold 5 sec) x 10 ea LE  PATIENT EDUCATION: Education details: Exercise technique; Plan for recert today Person educated: Patient and Parent Education method: Explanation Education comprehension: verbalized understanding  HOME EXERCISE PROGRAM: Access Code:  [email or web address] URL: [email or web address] Date: 03/23/2022 Prepared by: Reyes London   Exercises - Supine Bridge  - 3 x weekly - 3 sets - 10 reps - 2 hold - Supine Gluteal Sets  - 3 x weekly - 3 sets - 10 reps - 5 sec hold - Supine Quad Set  - 1 x daily - 3 x weekly - 3 sets - 10 reps - 5 hold - Seated Long Arc Quad  - 1 x daily - 7 x weekly - 3 sets - 10 reps - Seated Hip Adduction Squeeze with Ball  - 1 x daily - 3 x weekly - 3 sets - 10 reps - 5 hold - Seated Hip Abduction  - 1 x daily - 3 x weekly - 3 sets - 10 reps - 2 hold  GOALS: Goals reviewed with patient? Yes  SHORT TERM GOALS: Target date: 02/05/2024  Patient will be independent with updated HEP for improved overall LE strength in home Baseline: HEP continues to be updated as appropriate.  Goal status: NEW  LONG TERM GOALS: Target date: 03/18/2024 -Goal were updated/revised secondary to length of chart.   Patient will perform sit to stand transfer with appropriate AD and max assist of 2 people with 75% consistency to prepare for pregait activities.  Baseline:  09/04/2023- Patient performed several Sit to stand requiring max A+2  with improving posture- able to ext R knee better - near terminal knee and able to clear bottom off mat- still not full but able to contract glutes for standing as erect as possible.  Goal status: ONGOING  2.  Patient will tolerate 2 minutes or more of standing in sit to stand lift or with max assist +2 in order to indicate improved lower extremity weightbearing tolerance for progression to standing in parallel bars  Baseline:  09/04/2023- Patient performed 3 trials of standing ranging from 17 -47 sec - improving right knee terminal knee ext and more activation upon command with gluteals- able to clear mat table. Continued to require Max A+2 and blocking knees with forearm/trunk support.   Goal status: ONGOING  3.  Patient will perform rolling left to right with Mod assist for improved  independence with bed mobility to assist with dressing and positioning in bed.  Baseline: Dependent on caregiver to roll to left; 12/27/2023= Mod to max assist each direction (difficult due to RUE tone)  Goal status: Ongoing  4.  Patient will perform active bridging -able to clear bottom   Off mat for 10 reps with PT/caregiver holding his feet for improved trunk ext strength with standing and to assist with mobility/positioning.  Baseline: Unable to perform 10 reps with consistent clearing of bottom; 12/27/2023 = Patient able to minimally clear his glues off mat table - but successfully 10 reps today.  Goal status: MET  5. Patient will perform active bridging -able to clear bottom   Off mat for 3 sets of 10 reps with PT/caregiver holding his feet for improved trunk ext strength with standing and to assist with mobility/positioning.  Baseline: 12/27/2023- Patient able to complete 10 reps with max effort  Goal status: New     ASSESSMENT:  CLINICAL IMPRESSION: Treatment was limited again today due to patient not feeling well but wanted to do what he could and be stretched as he reported feeling very tight over the holidays. He did not complain or feel some of RLE stretches but was able to progress to some active BLE therex in session.  Pt will continue to benefit from skilled physical therapy intervention to address impairments, improve QOL, and attain therapy goals.   OBJECTIVE IMPAIRMENTS: Abnormal gait, cardiopulmonary status limiting activity, decreased activity tolerance, decreased balance, decreased cognition, decreased coordination, decreased endurance, decreased knowledge of condition, decreased knowledge of use of DME, decreased mobility, difficulty walking, decreased ROM, decreased strength, hypomobility, impaired flexibility, impaired sensation, impaired UE functional use, impaired vision/preception, postural dysfunction, obesity, and pain.   ACTIVITY LIMITATIONS: carrying, lifting,  bending, sitting, standing, squatting, stairs, transfers, bed mobility, bathing, toileting, dressing, self feeding, reach over head, and hygiene/grooming  PARTICIPATION LIMITATIONS: meal prep, cleaning, laundry, medication management, personal finances, interpersonal relationship, driving, shopping, community activity, occupation, yard work, and church  PERSONAL FACTORS: Time since onset of injury/illness/exacerbation and 3+ comorbidities: DM, CVA, Seizures are also affecting patient's functional outcome.   REHAB POTENTIAL: Good  CLINICAL DECISION MAKING: Evolving/moderate complexity  EVALUATION COMPLEXITY: Moderate  PLAN:  PT FREQUENCY: 1-2x/week  PT DURATION: 12 weeks  PLANNED INTERVENTIONS: 97110-Therapeutic exercises, 97530- Therapeutic activity, 97112- Neuromuscular re-education, 97535- Self Care, [id number]- Manual therapy, and Patient/Family education  PLAN FOR NEXT SESSION:  Continue with Standing frame Resume standing as able  Attempt standing frame if appropriate again Nustep for UE/LE strengthening Mat table dynamic sitting activities.    Reyes LOISE London, PT 03/05/2024,  4:45 PM

## 2024-03-06 ENCOUNTER — Ambulatory Visit

## 2024-03-06 ENCOUNTER — Ambulatory Visit: Admitting: Occupational Therapy

## 2024-03-06 DIAGNOSIS — M6281 Muscle weakness (generalized): Secondary | ICD-10-CM | POA: Diagnosis not present

## 2024-03-06 DIAGNOSIS — R2689 Other abnormalities of gait and mobility: Secondary | ICD-10-CM | POA: Diagnosis not present

## 2024-03-06 DIAGNOSIS — I6930 Unspecified sequelae of cerebral infarction: Secondary | ICD-10-CM

## 2024-03-06 DIAGNOSIS — R278 Other lack of coordination: Secondary | ICD-10-CM

## 2024-03-06 DIAGNOSIS — R262 Difficulty in walking, not elsewhere classified: Secondary | ICD-10-CM | POA: Diagnosis not present

## 2024-03-06 DIAGNOSIS — R269 Unspecified abnormalities of gait and mobility: Secondary | ICD-10-CM | POA: Diagnosis not present

## 2024-03-06 DIAGNOSIS — H543 Unqualified visual loss, both eyes: Secondary | ICD-10-CM | POA: Diagnosis not present

## 2024-03-06 DIAGNOSIS — R2681 Unsteadiness on feet: Secondary | ICD-10-CM | POA: Diagnosis not present

## 2024-03-06 NOTE — Therapy (Signed)
 . Occupational Therapy Neuro Treatment Note    Patient Name: Paul Hall MRN: [id number] DOB:06-19-1995, 28 y.o., male Today's Date: 03/06/2024  PCP: Dr. Bertell REFERRING PROVIDER: Dr. Bertell   .  OT End of Session - 03/06/24 1725     Visit Number [id number]    Number of Visits 180    Date for Recertification  03/20/24    OT Start Time 1538    OT Stop Time 1616    OT Time Calculation (min) 38 min    Activity Tolerance Patient tolerated treatment well    Behavior During Therapy [facility] for tasks assessed/performed            Past Medical History:  Diagnosis Date   Diabetes mellitus (HCC)    Hypertension    Stroke [facility])    Past Surgical History:  Procedure Laterality Date   IVC FILTER PLACEMENT ([facility] HX)     LEG SURGERY     PEG TUBE PLACEMENT     TRACHEOSTOMY     Patient Active Problem List   Diagnosis Date Noted   Sepsis due to vancomycin  resistant Enterococcus species (HCC) 06/06/2019   SIRS (systemic inflammatory response syndrome) (HCC) 06/05/2019   Acute lower UTI 06/05/2019   VRE (vancomycin -resistant Enterococci) infection 06/05/2019   Anemia 06/05/2019   Skin ulcer of sacrum with necrosis of muscle (HCC)    Urinary retention    Type 2 diabetes mellitus without complication, with long-term current use of insulin  (HCC)    Tachycardia    Lower extremity edema    Acute metabolic encephalopathy    Obstructive sleep apnea    Morbid obesity with BMI of 60.0-69.9, adult (HCC)    Goals of care, counseling/discussion    Palliative care encounter    Sepsis (HCC) 04/27/2019   H/O insulin  dependent diabetes mellitus 04/27/2019   History of CVA with residual deficit 04/27/2019   Seizure disorder (HCC) 04/27/2019   Decubitus ulcer of sacral region, stage 4 (HCC) 04/27/2019   ONSET DATE: 01/2019  REFERRING DIAG: CVA/COVID-19  THERAPY DIAG:  Muscle weakness (generalized)  Other lack of coordination  Rationale for Evaluation and Treatment  Rehabilitation  SUBJECTIVE:    SUBJECTIVE STATEMENT:   Pt.  Reports that his finger feel much better today.    Pt accompanied by: self and family member  PERTINENT HISTORY:  Pt. is a 28 y.o. male who was diagnosed with COVID-19, and CVA with resultant quadriplegia. Pt. Was then hospitalized with VRE UTI. PMHx includes: urinary retention, seizure disorder, obstructive sleep disorder, DM Type II, Morbid obesity.   PRECAUTIONS: None  WEIGHT BEARING RESTRICTIONS No  PAIN:   03/06/24: No pain  FALLS: Has patient fallen in last 6 months? No  LIVING ENVIRONMENT: Lives with: lives with their family Lives in: House/apartment Stairs: No Level Entry Has following equipment at home: Wheelchair (power) and hoyer lift, sit to stand lift  PLOF: Independent  PATIENT GOALS: To be able to engage in more daily care tasks.  OBJECTIVE:   HAND DOMINANCE: Right  ADLs:  Transfers/ambulation related to ADLs: Eating: Pt. reports being able to hold standard utensils, and is starting to engage more in self-feeding tasks, hand to mouth patterns. Pt. Reports that he does as much as he can with the task, and family assists  with the remainder of the task. Grooming: Pt. is able to initiate holding an electric toothbrush, and brush his teeth. Family assists LB Dressing: Total Assist UE dressing: Pt. is now able to reach up to  actively assist with grasping , and pulling his gown down. Toileting:  Total Assist Bathing: MaxA UB, Total assist LB Tub Shower transfers: N/A Equipment: See above    IADLs: Shopping: Relies on family to assist Light housekeeping: Total Assist Meal Prep: Total Assist Community mobility:   Medication management:  Total Assist  Financial management: N/A Handwriting: Not legible: Pt. Is able to hold a pen with the left hand, and initiate marking the page. Pt.'s eye glasses were not available  MOBILITY STATUS:  Power w/c  POSTURE COMMENTS:  Pt. Requires position changes  in his power w/c  ACTIVITY TOLERANCE: Activity tolerance:  Fair  FUNCTIONAL OUTCOME MEASURES: FOTO: 40 TR score: 45  05/25/2022:   FOTO: 50 TR score: 45  07/20/22: FOTO 55  03/08/23: 51  UPPER EXTREMITY ROM     Active ROM Left eval Left  03/21/2022 Left 05/25/2022 L  07/20/22 Left 07/25/2022 Left  08/15/2022 Left  09/26/2022 Left  11/16/2022 Left 12/28/2022 Left 03/13/23 Left 05/10/23 Left 10/23/23 Left 12/27/23  Shoulder flexion 119 123 125 130  136 138 130 142 144 144 148 148  Shoulder abduction 110 115 115 115  118 121 125 142 140 140 140 140  Shoulder adduction               Shoulder extension               Shoulder internal rotation               Shoulder external rotation               Elbow flexion 135(135) 145 145 145  145    145 145  145  Elbow extension -27(-18) -21(-20) -20(-16)  -25(-10) -23(-10) -21(-10) -20(-18) -20(-18) -20(-17) -17(-16) -12(-8) -12(-6)  Wrist flexion               Wrist extension 10(50) 19(50) 28(50)  30(50) 35(55) 36(55) 36(60) 40(60) 45(60) 45(60) 42(54) 36(54)  Wrist ulnar deviation               Wrist radial deviation               Wrist pronation            60(74) 84(84)  Wrist supination   Limited by flexor tone       10(15)  30(38) 20(38)  (Blank rows = not tested)    Active ROM Right eval Right 03/21/2022 Right 05/25/2022 R  07/20/22 Right  07/25/22 Right 08/15/2022 Right 09/26/2022 Right 11/16/2022 Right 12/28/2022 Right 03/13/23 Right 05/10/23 Right 10/23/23 Right  9/24 /25  Shoulder flexion 106 scaption 105  Scaption 62 flexion 110 scaption 100  105 105 105 112 Scaption 123 Scaption 122  Scaption 142 Scaption 142  Shoulder abduction 114 90 97 100  105 117 120 124 124 122 144 146  Shoulder adduction               Shoulder extension               Shoulder internal rotation               Shoulder external rotation               Elbow flexion 120(130) 145 145 140  144 140 142 148 148 145  145  Elbow extension -45(-35)  -22(-35) -28(-22)  -32(-21) -32(-28) -28(-26) -28(-28) -27(-26) -27(-40) -38(-35) -34(-26) -34(-26)  Wrist flexion  Wrist extension -30(10) -25(10) -10(30)  -10(20) -10(25) -20(40) -30(10) -22(34) -22(35) -32(22) -30(-8) -38(-6)  Wrist ulnar deviation               Wrist radial deviation               Wrist pronation               Wrist supination   Limited by flexor tone                 12/28/2022: Thumb radial abduction: Right: 12(46), Left: 40(54)  Digits: Bilateral 2nd through 5th digit: MP Hyperextension with PIP, an DIP flexion    10/23/23:  Bilateral 2nd through 5th digit MP hyperextension with PIP, and DIP flexion: Pt. has been intermittently able to initate active diigt PIP, and DIP extension following Botox during this past recertification/progress reporting period.     UPPER EXTREMITY MMT:     Left eval Left 03/21/2022 Left  05/25/2022 L 07/20/22 Left  08/15/2022 Left 09/26/2022 Left 11/16/2022 Left 12/28/2022 Left  03/13/2023 Left  05/10/23 Left 10/23/23 Left 12/27/23  Shoulder flexion 3-/5 3/5 3+/5 4+/5 4+/5 4+/5 4+/5 4+/5 4+/5 4+/5 4+/5 4+/5  Shoulder abduction 3-/5 3-/5 4-/5 4+/5 4+/5 4+5 4+/5 4+/5 4+/5 4+/5 4+/5 4+/5  Shoulder adduction              Shoulder extension              Shoulder internal rotation              Shoulder external rotation              Elbow flexion 3/5 3+/5 4-/5 4+/5 5/5 5/5 5/5 5/5 5/5 5/5 5/5 5/5  Elbow extension 2-/5 2/5  4+/5 4+/5 4+/5  4/5 4/5 4/5 through available range 4+/5 through available range 4+/5  Wrist flexion              Wrist extension 2/5 2+/5 3-/5  3-/5 3-/5 3-/5 3/5 3/5 3/5 3+/5 3-/5  Wrist ulnar deviation              Wrist radial deviation              Wrist pronation              Wrist supination              (Blank rows = not tested)  Right eval Right 03/21/2022 Right  05/25/2022 R 07/20/22 Right 08/15/2022 Right 09/26/2022 Right 11/16/2022 Right 12/28/2022 Right 03/13/2023 Right 05/10/23 Right 10/23/23  Right 12/27/23  Shoulder flexion 3-/5 scaption 3-/5 3-/5 3+/5 3+/5 3+/5 3/5 3/5 3/5 3+/5 4-/5 4/5  Shoulder abduction 3-/5 3-/5 3-/5 4/5 4/5 4/5 4/5 4/5 4/5 4/-5 4-/5 4/5  Shoulder adduction              Shoulder extension              Shoulder internal rotation              Shoulder external rotation              Elbow flexion 3/5 3/5 3+/5 4/5 4+/5 4+/5 4+/5 4+/5 4+/5 4+/5 4+/5 5/5  Elbow extension 2-/5 2/5 3-/5 4+/5 4+/5 4+/5 4+/5 4-/5 4-/5 4-/5 4-/5 through available range 3-/5  Wrist flexion              Wrist extension 2-/5 2-/5 2/5 2/5 2/5 2/5 2/5 2/5 2/5 2/5 2/5 2-/5  Wrist  ulnar deviation              Wrist radial deviation              Wrist pronation              Wrist supination                HAND FUNCTION:  Grip strength: Right: 0 lbs; Left: 0 lbs and Lateral pinch: Right: 5 lbs, Left: 2 lbs  03/21/2022: Lateral pinch: Right: 3.5 lbs, Left: 2 lbs  07/20/22: Grip strength: Right: 3 lbs; Left: 4 lbs and Lateral pinch: Right: 5 lbs, Left: 3 lbs  08/15/2022:  Grip strength: Right: 0 lbs; Left: 5 lbs and Lateral pinch: Right: 2 lbs, Left: 4 lbs  09/26/2022  Grip strength: Right: 0 lbs; Left: 8 lbs and Lateral pinch: Right: 2 lbs, Left: 5 lbs  11/16/2022  Grip strength: Right: 0 lbs; Left: 8 lbs  9/25/20204  Grip strength: Right: 0 lbs; Left: 8 lbs  Grip strength: Right: 0 lbs; Left: 8 lbs   03/08/23:  Grip strength: Right: 0 lbs without PROM, 5 lbs after PROM, Left 8 lbs  05/10/23:  Grip strength: TBD  Bilateral digit PIP/DIP flexion contractures with MP hyperextension with attempts for AROM. Pt. is able to tolerate AROM to the bilateral digits at the initial evaluation however, has a history of pain in the digits.  COORDINATION: Eval: Pt. is unable to grasp 9-hole test pegs. Pt. is able to initiate grasping larger pegs, and is able to hold a pen in the left hand.  07/20/22: 2 min 36 seconds to remove 9 pegs from 9 hole peg test - cues to locate pegs 2/2 low  vision. Pt. is able to initiate grasping larger pegs on R hand and is able to hold a pen in the left hand.  08/15/2022:    Vision, and sensation limiting accuracy of 9 hole peg test results. Pt. Was able to grasp and remove vertical pegs intermittently with cues.    05/10/23: TBD  SENSATION: Light touch: Impaired   EDEMA:  N/A  MUSCLE TONE: BUE flexor Spasticity  COGNITION Overall cognitive status: Continue to assess in functional context  VISION:   Subjective report: Pt. was not wearing glasses at the time of the initial eval.  Baseline vision: Vision is very limited. Wears glasses all the time Visual history: History of impaired vision following CVA. Pt. Has received treatment through the [facility] low vision rehabilitation program.   VISION ASSESSMENT: Impaired To be further assessed in functional context  PERCEPTION: Impaired   PRAXIS: Impaired: motor planning  OBSERVATIONS:  Pt reports being on Tramadol   TODAY'S TREATMENT 03/06/24  Moist heat modality to the RUE, and bilateral hands in conjunction with ROM  Therapeutic Ex.:    -BUE tolerated PROM was performed with emphasis placed on slow prolonged gentle stretching of the Digit MCP/PIP/DIPs of bilateral hands in conjunction with moist heat modality 2/2 flexor tone, tightness, and stiffness.  Therapeutic Activities:   -Facilitated bilateral [facility] skills grasping 1.5 flat discs from the therapist hand, transferring them from one hand to another in combination with reaching out across the table with shoulder flexion, and elbow extension to place them into a clear container.  -Emphasis was placed on digit extension when releasing them into a container.  PATIENT EDUCATION: Education details: ROM, BUE tone  Person educated: Patient and Parent Education method: Explanation Education comprehension: verbalized understanding  HOME EXERCISE PROGRAM:  Incorporating  the right hand during self-feeding tasks.    GOALS: Goals reviewed with patient? Yes  SHORT TERM GOALS: Target date: 12/05/2023   To assess splint fit, and make appropriate adjustments to promote good skin integrity through the palmar surface of the bilateral hands.  Baseline: 05/25/22: Goal currently met, however ongoing as needs to assess splint fit arise. 03/23/2022: Pt. is wearing splints a couple of hours at night bilateral resting hand splints. 03/21/2022: Pt. is wearing splints a couple of hours at night bilateral resting hand splints. Goal status: Deferred   LONG TERM GOALS: Target date: 03/20/2024   FOTO score will Improve by 2 points for Pt. perceived improvement with the assessment specific ADL/IADL tasks.  Baseline: 03/08/23: 51; 12/28/2022: FOTO score: 56; 11/16/2022: 52 09/27/2022: 51 07/20/2022: FOTO 55 05/25/2022: FOTO score: 50,  TR score: 45 Eval: FOTO score: 40, TR score: 45 Goal status: Achieved   2.   Pt. will independently perform oral care for 100% of the task after complete set-up. Baseline: 05/10/23: Pt. Performs 90% of the task. Pt.'s mother assists with 10% of the task for thorough cleaning the hard to reach places way in the back of the oral cavity.03/08/23: not as much assist required proximally, but to adjust positioning of toothbrush; 02/13/23: Pt. Continues to require assist proximally 01/09/2023: Continue 12/28/2022:  Pt. Continues to complete 90% of the task with complete set-up, with visual cues, and occassional assist of the right elbow. 11/16/2022: Pt. Completes 90% of the task with complete set-up. 09/26/2022: Completes 90% of the task, proximal assist at times required at the elbow.  08/15/2022: Completes 90% of the task, proximal assist at times required at the elbow. 07/20/22: completes 90% of task, limited by shoulder flexion. 05/25/2022:  Pt. Is able to initiate and perform oral care for approximately 90% of the task. Complete set-up required. Assi needed only for the very back teeth. 03/23/2022: Pt. Is able to  initiate and perform oral care for approximately 75% of the task. Pt. Requires assist at proximally at the elbow for through oral care. 03/21/2022: Pt. Is able to initiate and perform oral care for approximately 75% of the task. Pt. Requires assist at proximally at the elbow for through oral care. Eval: Pt. is able to initiate using an electric toothbrush. Pt. requires assist for set-up, and assist for thoroughness, and as he Pt. fatigues. Goal status:  Partially met, Goal discontinued  3.  Pt. Will be modified independence with self-feeding for 100% using a swivel spoon, and standard fork after complete set-up Baseline: 05/10/2023: Pt. feeds self 95-100% of the meal. with occasional assist to reposition the utensil. 03/08/23: indep with cup with a handle and lid, assist to reposition eating utensils in hand;  02/13/23:  Pt. Is now able to consistently, and efficiently use a swivel spoon for eating cereal with the left hand.01/09/2023: Continue 12/28/2022: Pt. Is now feeding himself 90% of the time using a swivel spoon with the left hand, and 95% of the time with the fork. Pt. Continues to have difficulty with cutting food. 11/16/2022: 100% with finger foods, Pt. Is initiating with a swivel spoon, and universal cuff for fork use, however requires assist for consistency, and accuracy.  09/26/2022: 90% using the spoon, assist at time with the forearm motion, and grip on the utensils. 100% for finger foods.  08/15/2022:  90% using the spoon, assit at time with the forearm motion, and grip on the utensils. 100% for finger foods-independent with set-up- unsupervised.  07/20/22:  self-feeds cereal using spoon 90% of task. 05/25/2022: Pt. Is able to use a spoon to scoop cereal when feeding himself cereal 85% of the time. Pt. Is able to feed himself snack/finger foods 100% of the time. Pt. Continues to work on consistency of  stabilizing a cup/mug when drinking. Pt. Is able to grasp a water  bottle with assist initially, with  assist tapering off as he drinks.03/23/2022: Pt. Is able to perform scooping cereal for 75% of the time. Pt. required assist, and support at the left elbow, and Pt. Presents with limited forearm supination when using the spoon, and bringing it towards his mouth. Pt. Is able to use a fork to spear items, and perform the hand to mouth pattern.  03/21/2022: Pt. Is able to perform scooping cereal for 75% of the time. Pt. required assist, and support at the left elbow, and Pt. presents with limited forearm supination when using the spoon, and bringing it towards his mouth. Pt. Is able to use a fork to spear items, and perform the hand to mouth pattern.  Eval: Pt. is able to hold standard standard utensils. Pt. Performs as much of the task as he, can and has assistance for the remainder. Goal status:  Improved, Achieved  4.  Pt. will improve grasp patterns and consistently grasp 1/4 objects for ADL, and IADL tasks.  Baseline: 01/08/24: Continue, carry goal over from recertification update last session. 12/27/23: Pt. is able to present with increased tightness  2/2 being way overdue for Botox injections, which limits consistency with grasping small objects. 10/23/2023: Pt. is able to initiate grasping small objects visual cues, and increased time, however continues to work towards grasping 1/4 objects efficiently. 08/27/23: Pt. is able to grasp flat discs, and sustain a secure hold on them while moving the UE through various planes. 08/07/23: Continue 05/31/2023: Continue 05/10/23: Pt. is consistently able to grasp 1/2 objects with visual cues. Pt. Requires assist, and increased visual cues to grasp 1/4 objects on an elevated plane.  Pt. Is unable to grasp the flat objects at the tabletop surface. Pt. Is grasping grasping small puppy vitamins.03/08/23: 1/2 objects efficiently; 02/13/2023: 1/2 objects efficiently 01/09/2023: Continue 12/28/2022: Continues to grasp 1/4 pegs with min a + visual cues, consistently grasping  1/2 objects with visual cues 11/16/2022: Continues to grasp 1/4 pegs with min a + visual cues, consistently grasping 1/2 objects with visual cues 09/26/2022: Continues to grasp 1/4 pegs with min a + visual cues, consistently grasping 1/2 objects with visual cues. 08/15/2022: grasps 1/4 pegs with min a + visual cues, consistently grasping 1/2 objects with visual cues. 07/20/22: grasps 1/4 pegs with min a + visual cues, consistently grasping 1/2 objects with visual cues. 05/25/2022: Pt. Is working on improving consistency of grasping 1/2 objects with visual cues.  03/23/2022: Pt. Is able to grasp 1 objects consistently,and continues to work on the hand patterns needed to grasp 1/2 objects.03/21/2022: Pt. Is able to grasp 1 objects consistently,and continues to work on the hand patterns needed to grasp 1/2 objects. Eval: Pt. is able to grasp 1 objects intermittently using a lateral grasp pattern. Goal status: Ongoing  5.  Pt. will independently write his name legibly with letter sizes under 1. Baseline: 05/10/23: Deferred 03/08/23: Not addressed this period; 02/13/2023: Continue 01/09/2023: Continue. 12/28/2022: Pt. is now using an IPad Pen. 11/16/2022: Pt. Continues to be able to write name with smaller letter size. Pt. Is signing name with a computer styus. Pt. Requires visual cues, and assist 09/26/2022:  Pt. Continues to be able to write name with smaller letter size. Pt. Is signing name with a computer styus. Pt. Requires visual cues, and assist. 08/15/2022: Pt. Is able to write name with smaller letter size. Pt. Is signing name with a computer styus. Pt. Requires visual cues, and assist. 07/20/22: stabilizing assist to write name with 75% legibility with 2 letters. 05/25/2022: Pt. to continue to work towards formulating grasp patterns in preparation for grasping a large width pen. Pt. Requires visual cues. 03/23/2022: Pt. Is able to write his name with modA, however has difficulty with formulating letter  sizes less than 2 in size with 50% legibility for the 3 letters of his name.03/21/2022: Pt. Is able to write his name with modA, however has difficulty with formulating letter sizes less than 2 in size with 50% legibility for the 3 letters of his name. Eval: Pt is able to hold a thin marker with his left hand, and formulate a line, and initiate a circular pattern (Pt. without glasses today) Goal status: Deferred  6. Pt. Will reach up to comb/brush his hair with minA.  Baseline: 05/10/23: Pt. is able to use a pick independently with the left hand with occasional assist to the far side of the left his head. 03/08/23: Pt uses pick in L hand to reach 75% of his head; unable to reach R side of head; 02/13/2023:Pt. is able to use a hair pick with the left hand on the right side of his head, top, and back of the head. Pt. Continues to have difficulty reaching further to the right side of his head with the left. 01/09/2023: Continue 12/28/2022: Pt. Is progressing, and is now able to use a hair pick with the left hand on the right side of his head, top, and back of the head. Pt. Continues to have difficulty reaching further to the right side of his head with the left. 11/16/2022: Pt. Is able to use a hair pick for the left side of his head using his left hand. Pt. Presents with difficulty reaching to the right side of his head.09/26/2022: Pt. Is able to use a hair pick for the left side of his head using his left hand. Pt. Presents with difficulty reaching to the right side of his head. 5/13/202: 75% using a hair pick, assist to the right side of the head. 07/20/22: reaches 75% of head, assist for far R side of head, fatigues quickly. 05/25/2022: Pt. Is able to reach up with the left hand to the left side, top, and back of his head. 03/23/2022: Pt. is now able to more consistently initiate reaching up to his head with his left hand in preparation for haircare12/18/2023: Pt. is now able to more consistently initiate reaching  up to his head with his left hand in preparation for haircare. Eval: Pt. is able to initiate reaching up for hair care with a long handled brush, however is unable to sustain UE's in elevation to perform the task.     Goal status: Achieved  7. Pt. Will independently navigate the w/c through his environment with minA with visual scanning, and hand placement on the controls.  Baseline: 11/16/2022: Deferred 2/2 vision 09/26/2022: Pt. Is now navigating his w/c ouside the home, and around his block. 08/15/2022: Pt. Requires minA to setup hand on controls and MIN cues to navigate the w/c in wide spaces, requires MIN - MOD cues to navigate the w/c through more narrow doorways, and tighter turns - varies based on  good vs bad vision days.07/20/22: Pt. Requires minA to setup hand on controls and MIN cues to navigate the w/c in wide spaces, requires MIN - MOD cues to navigate the w/c through more narrow doorways, and tighter turns - varies based on good vs bad vision days. 05/25/2022: Pt. Requires minA  and  cues to navigate the w/c in wide spaces, and requires Mod cues to navigate the w/c through more narrow doorways, and tighter turns. Pt. Requires max cues for scanning through the environment, and moderate cues for hand placement on the controls.     Goal status: Deferred 2/2 vision    8. Pt. Will improve bilateral grip strength to be able to independently grasp, and pull up blankets, and linens.   Baseline: 05/10/23: Deferred 03/08/23: R grip 0-5 lbs, L grip 8 lbs; pt reports he can consistently pull up blankets and linens independently.  02/13/2023: Continue. Pt. presents with digit PIP, and DIP flexor tightness. 01/09/2023: Continue 12/28/2023: R: 0#, L: 5# Pt. Is now able to more consistently grasp and pull blankets up over him. 11/16/2022: R: 0 L: 8# Pt. Is more consistently grasping his blankets and attempting to pull them up. 09/26/2022: R: 0  L: 8# Pt. is attempting to grasp, and pull blankets up more, using mostly  the left hand. 08/15/2022: Pt. has difficulty securely holding, and pulling up blankets, and linens.    Goal status:Deferred  9. Pt. will consistently actively control the releasing of blankets, covers, and linens from his hands once they are in the desired position over him. Baseline: 12/28/2022: Pt. Is consistently actively able to release linens once they are in the desired position over him. 11/16/2022: Pt. continues to improve with actively releasing blankets form his hands. 09/26/2022: Pt. is improving with actively releasing blankets form his hands. 08/15/2022: Pt. has difficulty with controlled releasing of blankets/linens from his grasp.     Goal status: Achieved    10. Pt. Will improve bilateral UE strength by 2 MM grades to assist with ADLs, and IADLs  Baseline: 01/08/24: Continue, carry goal over from recertification update last session. 12/27/23:  Right: shoulder flexion: 4/5, abduction: 4/5, elbow flexion: 5/5 , extension: 3-/5 wrist extension: 2-/5; Left: shoulder flexion: 4+/5, abduction: 4+/5, elbow flexion: 5/5 , extension: 4+/5  wrist extension: 3+/5  08/27/23:  Right: shoulder flexion: 3+/5, abduction: 4-/5, elbow flexion: 4+/5 , extension: 4-/5 wrist extension: 2/5; Left: shoulder flexion: 4+/5, abduction: 4+/5, elbow flexion: 5/5 , extension: 4+/5  wrist extension: 3-/5  10/23/2023: Right: shoulder flexion: 4-/5, abduction: 4-/5, elbow flexion: 4+/5 , extension: 4-/5 wrist extension: 2/5; Left: shoulder flexion: 4+/5, abduction: 4+/5, elbow flexion: 5/5 , extension: 4+/5  wrist extension: 3+/5  08/27/23:  Right: shoulder flexion: 3+/5, abduction: 4-/5, elbow flexion: 4+/5 , extension: 4-/5 wrist extension: 2/5; Left: shoulder flexion: 4+/5, abduction: 4+/5, elbow flexion: 5/5 , extension: 4/5  wrist extension: 3/5  08/07/23: Continue 05/31/2023: Continue 05/10/23: Right: shoulder flexion: 3+/5, abduction: 4-/5, elbow flexion: 4+/5 , extension: 4-/5 wrist extension: 2/5; Left: shoulder flexion:  4+/5, abduction: 4+/5, elbow flexion: 5/5 , extension: 4/5  wrist extension: 3/5 03/13/2023: Right: shoulder flexion: 3/5, abduction: 4/5, elbow flexion: 4+/5 , extension: 4-/5 wrist extension: 2/5; Left: shoulder flexion: 4+/5, abduction: 4+/5, elbow flexion: 5/5 , extension: 4/5  wrist extension: 3/5 03/08/23: NT this date d/t as pt arrived late; 02/13/2023: Continue 01/09/2023: Continue 12/28/2022: Right: shoulder flexion: 3/5, abduction: 4/5, elbow flexion: 4+/5 , extension: 4-/5 wrist extension: 2/5; Left: shoulder flexion: 4+/5, abduction: 4+/5,  elbow flexion: 5/5 , extension: 4/5  wrist extension: 3/5 Right: shoulder flexion: 3+/5, abduction: 4/5, elbow flexion: 4+/5 , extension: 4+/5 wrist extension: 2/5; Left: shoulder flexion: 4+/5, abduction: 4+/5, elbow flexion: 5/5 , extension: 4+/5 wrist extension: 3-/5 11/16/2022: Right: shoulder flexion: 3/5, abduction: 4/5, elbow flexion: 4+/5 , extension: 4+/5 wrist extension: 2/5; Left: shoulder flexion: 4+/5, abduction: 4+/5, elbow flexion: 5/5 , extension:  wrist extension: 3-/5 Right: shoulder flexion: 3+/5, abduction: 4/5, elbow flexion: 4+/5 , extension: 4+/5 wrist extension: 2/5; Left: shoulder flexion: 4+/5, abduction: 4+/5, elbow flexion: 5/5 , extension: 4+/5 wrist extension: 3-/5 Goal status: Ongoing  11. Pt. will improve right wrist extension by 10 degrees to initiate reaching for items in preparation for ADLs. Baseline:01/08/24: Continue, carry goal over from recertification update last session.  12/27/23: Right wrist extension: -38(-6) 10/23/23: -30(-8) 08/23/2023: R Wrist Ext: -31(-8)  08/07/23: Continue 05/31/23: Continue 05/10/23: Right wrist extension: -32(22) 03/13/23: -22(35) 03/08/23: NT this date as pt arrived late; 02/13/2023: Continue 01/09/2023: Continue 12/28/2022: -22(34) 11/16/2022: -30(10)    Goal status: Ongoing      12. Pt. Will require donn/doff a Tank top T-shirt efficiently with supervision and full set-up.    Baseline: 01/08/24:  Continue, carry goal over from recertification update last session. 12/27/23: Independent doffing, MaxA doffing 10/23/23: Independent doff the shirt/tank, MaxA  donning. 10/23/23: Pt. Is able to independently doff his shirt, requiring assist to lean forward and loosen the back. 08/07/23: Continue 05/31/2023: Pt. Requires maxA donning a pullover sweatshirt, Mod-maxA doffing it. T-shirt tank TBD 05/10/23: Pt. Presents with difficulty completing, and requires increased time to complete    Goal status: Ongoing    13. Pt. Will independently perform bilateral/bimanual hand tasks needed to fold towels/linens/laundry Baseline: 01/08/24: Continue, carry goal over from recertification update last session. 12/27/23: Pt. Has not been able to work on the this  2/2 multiple cancellations this recertification 2/2 illness, familiy health issues,a nd transportation. 10/23/23: Pt. Continues to have difficulty folding laundry, and linens. 08/23/23: Pt. Continues to have difficulty using bilateral/bimanual hand tasks needed to fold towels/linens/laundry. 08/07/23: Continue 05/31/23: Continue 05/10/23:  Pt. Has difficulty folding towels/lines/laundry using bilateral hands Goal status:Ongoing  14. Pt. Will independently, and efficiently reach his right arm over the side of the chair to pet his dog.    Baseline: 08/23/2023: Pt. Is now able to reach over the side of the armrest of his recliner chair to pet his dog.  08/07/23: Continue 05/31/2023: Continue 05/10/23: Pt. is unable to efficiently reach his right UE over the side of his recliner chair to pet his dog.    Goal status: Achieved    15. Pt. Will independently, and efficiently reach out in front of him to efficiently place items onto his table while sitting in his recliner.     Baseline: 01/08/24: Continue, carry goal over from recertification update last session. 12/27/2023: Pt. is able to reach out to efficiently reach out in front of him to place items on the table 90% of the time  with the LUE, 10% of the time with the RUE 2/2  increased flexor tone. 10/23/23: Pt. Is improving with functional reaching with the left UE out in front of him to place items onto the table. Pt. has difficulty reaching out with the right hand to  place items on the table. 08/23/2023: Pt. has difficulty consistently reaching out in front of him to place items on the on a tabletop surface with the right hand. 08/07/23: Continue 2/26/225: Continue 05/10/23: Pt.  has difficulty reaching out in front of him far enough to efficiently place items on the table while sitting in a recliner chair.    Goal status: Continue/Ongoing     16. Pt. Will improve bilateral hand supination by 10 degrees to be able actively turn items in his hand to look at them independently and efficiently during ADLs/IADLs,    Baseline: 01/08/24: Continue, carry goal over from recertification update last session. 12/27/23: Right: Unable 2/2 increased tightness  Left: supination 20(38) 10/23/23: Right: unable, L: supination 30(38)    Goal status: Continue/Ongoing      17. Pt. Will independently, and efficiently transition between picking up finger foods for dipping, and  picking up utensils , then placing them down on the tabletop surface through 75% of the meal with minimal cues.    Baseline: 01/08/24: Continue, carry goal over from recertification update last session. 12/27/23: Pt. is able to consistently transition between picking up finger foods for dipping, and utensils consistently with the left hand with 100% accuracy time, and the right hand with 5% accuracy. 10/23/23: Pt. requires heavy cues, and assist to grasp finger foods, and utensils, and transition between the two.    Goal status: Continue/Ongoing   ASSESSMENT:  CLINICAL IMPRESSION:  Pt. presented with less discomfort  in  the bilateral hands today. Pt. reports that they were able to massage them earlier today. Pt. was able to tolerate the moist heat modality, ROM, and STM. Pt.  Was able to efficiently grasp and transfer the discs with the right hand to the left hand. Pt. presented with more difficulty initially transferring them from the left hand to the right hand, however with reps Pt. was able to complete the task more consistently with increased time, and visual cues. Pt.'s caregivers continue to be very supportive to continue working towards improving BUE functioning, ROM, strength, motor control, [facility] skills in order to maximize independence with daily ADLs, and IADL functioning. Plan to continue with the same goals into the next recertification period.   PERFORMANCE DEFICITS in functional skills including ADLs, IADLs, coordination, dexterity, proprioception, ROM, strength, pain, FMC, GMC, decreased knowledge of use of DME, and UE functional use, cognitive skills including safety awareness, and psychosocial skills including coping strategies, environmental adaptation, habits, and routines and behaviors.   IMPAIRMENTS are limiting patient from ADLs, IADLs, leisure, and social participation.   COMORBIDITIES may have co-morbidities  that affects occupational performance. Patient will benefit from skilled OT to address above impairments and improve overall function.  MODIFICATION OR ASSISTANCE TO COMPLETE EVALUATION: Maximum or significant modification of tasks or assist is necessary to complete an evaluation.  OT OCCUPATIONAL PROFILE AND HISTORY: Comprehensive assessment: Review of records and extensive additional review of physical, cognitive, psychosocial history related to current functional performance.  CLINICAL DECISION MAKING: High - multiple treatment options, significant modification of task necessary  REHAB POTENTIAL: Fair    EVALUATION COMPLEXITY: High    PLAN: OT FREQUENCY: 2 x's a week  OT DURATION:12 weeks  PLANNED INTERVENTIONS: self care/ADL training, therapeutic exercise, therapeutic activity, neuromuscular re-education, manual therapy, passive  range of motion, patient/family education, and cognitive remediation/compensation RECOMMENDED OTHER SERVICES: PT  CONSULTED AND AGREED WITH PLAN OF CARE: Patient and family member/caregiver  PLAN FOR NEXT SESSION: Review HEP   Richardson Otter, MS, OTR/L  03/04/24

## 2024-03-07 NOTE — Therapy (Signed)
 OUTPATIENT PHYSICAL THERAPY NEURO TREATMENT  Patient Name: Paul Hall MRN: [id number] DOB:January 30, 1996, 28 y.o., male Today's Date: 03/07/2024   PCP:  Morene Sous, PA-C  REFERRING PROVIDER: Morene Sous, PA-C  END OF SESSION:  PT End of Session - 03/06/24 2131     Visit Number 185    Number of Visits 200   date corrected from most recent recert   Date for Recertification  03/18/24    Authorization Type [employer] COMM [employer]/ [place] Medicaid    Authorization Time Period 01/04/21-03/29/21; Recert 03/24/2021-06/16/2021; Recert 09/15/2021- 12/08/2021; Recert 12/13/2021-03/07/2022    Progress Note Due on Visit 190    PT Start Time 1617    PT Stop Time 1659    PT Time Calculation (min) 42 min    Equipment Utilized During Treatment Gait belt    Activity Tolerance Patient tolerated treatment well    Behavior During Therapy WFL for tasks assessed/performed              Past Medical History:  Diagnosis Date   Diabetes mellitus (HCC)    Hypertension    Stroke (HCC)    Past Surgical History:  Procedure Laterality Date   IVC FILTER PLACEMENT ([facility] HX)     LEG SURGERY     PEG TUBE PLACEMENT     TRACHEOSTOMY     Patient Active Problem List   Diagnosis Date Noted   Sepsis due to vancomycin  resistant Enterococcus species (HCC) 06/06/2019   SIRS (systemic inflammatory response syndrome) (HCC) 06/05/2019   Acute lower UTI 06/05/2019   VRE (vancomycin -resistant Enterococci) infection 06/05/2019   Anemia 06/05/2019   Skin ulcer of sacrum with necrosis of muscle (HCC)    Urinary retention    Type 2 diabetes mellitus without complication, with long-term current use of insulin  (HCC)    Tachycardia    Lower extremity edema    Acute metabolic encephalopathy    Obstructive sleep apnea    Morbid obesity with BMI of 60.0-69.9, adult (HCC)    Goals of care, counseling/discussion    Palliative care encounter    Sepsis (HCC) 04/27/2019   H/O insulin  dependent diabetes mellitus 04/27/2019    History of CVA with residual deficit 04/27/2019   Seizure disorder (HCC) 04/27/2019   Decubitus ulcer of sacral region, stage 4 (HCC) 04/27/2019    ONSET DATE: October 2020  REFERRING DIAG: Cerebral infarction, unspecified   THERAPY DIAG:  Muscle weakness (generalized)  Difficulty in walking, not elsewhere classified  Other lack of coordination  History of CVA with residual deficit  Unsteadiness on feet  Other abnormalities of gait and mobility  Rationale for Evaluation and Treatment: Rehabilitation  SUBJECTIVE:  SUBJECTIVE STATEMENT: Patient reports continued feeling of very tight and states he did receive some relief last visit. States he had some adjustments made to his power wheelchair and well pleased with comfort with seating now. States he was over inflating the seat because he thought he had a leak.   Pt accompanied by: family member  PERTINENT HISTORY: Paul Hall is a 28 y/o male who presents with severe weakness, quadriparesis, altered sensorium, and visual impairment s/p critical illness and prolonged hospitalization. Pt hospitalized in October 2020 with ARDS 2/2 COVID19 infection. Pt sustained a complex and lengthy hospitalization which included tracheostomy, prolonged sedation, ECMO. In this period pt sustained CVA and SDH. Pt has now been liberated from tracheostomy and G-tube. Pt has since been hospitalized for wound infection and UTI. Pt lives with parents at home, has hospital bed and left chair, hoyer lift transfers, and power WC for mobility needs. Pt needs heavy physical assistance with ADL 2/2 BUE contractures and motor dysfunction   PAIN:  Are you having pain? No  PRECAUTIONS: Fall  RED FLAGS: None   WEIGHT BEARING RESTRICTIONS: No  FALLS: Has patient fallen in  last 6 months? No  LIVING ENVIRONMENT: Lives with: lives with their family Lives in: House/apartment Stairs:  Has following equipment at home: Wheelchair (power) and hospital bed, hoyer lift, Sit to stand lift  PLOF: Independent  PATIENT GOALS: I want to walk  OBJECTIVE:  Note: Objective measures were completed at Evaluation unless otherwise noted.  DIAGNOSTIC FINDINGS: CLINICAL DATA:  Altered mental status.  History of COVID and stroke.   EXAM: CT HEAD WITHOUT CONTRAST   TECHNIQUE: Contiguous axial images were obtained from the base of the skull through the vertex without intravenous contrast.   COMPARISON:  CT head 11/11/2012   FINDINGS: Brain: Left cerebral convexity cortical hypodensity with associated cortical calcification extending from the frontal to the parietal lobe. Extensive area of volume loss and low-density compatible with chronic infarction. This is noted on prior reports from [facility]. Probable chronic watershed infarct. Additional small areas of hypodensity in the right frontal white matter consistent with chronic ischemia.   Negative for acute infarct. No acute hemorrhage or mass. Ventricle size normal without midline shift.   Vascular: Normal arterial flow voids.   Skull: Negative   Sinuses/Orbits: Mucosal edema paranasal sinuses with air-fluid level left sphenoid sinus. Negative orbit   Other: None   IMPRESSION: Extensive watershed type infarct over the left cerebral convexity with cortical calcification. This appears chronic. No associated hemorrhage, hydrocephalus, or midline shift.   These results were called by telephone at the time of interpretation on 05/09/2019 at 3:12 pm to provider [name] , who verbally acknowledged these results.     Electronically Signed   By: [name] M.D.   On: 05/09/2019 15:13  COGNITION: Overall cognitive status: Impaired   SENSATION: Light touch: WFL  COORDINATION: Delayed with  BLE  EDEMA:  None observed  MUSCLE TONE: RLE: Moderate  POSTURE: rounded shoulders and forward head  LOWER EXTREMITY ROM:     Active  Right Eval Left Eval  Hip flexion    Hip extension    Hip abduction    Hip adduction    Hip internal rotation    Hip external rotation    Knee flexion    Knee extension    Ankle dorsiflexion    Ankle plantarflexion    Ankle inversion    Ankle eversion     (Blank rows = not tested)  LOWER  EXTREMITY MMT:    MMT Right Eval Left Eval  Hip flexion    Hip extension    Hip abduction    Hip adduction    Hip internal rotation    Hip external rotation    Knee flexion    Knee extension    Ankle dorsiflexion    Ankle plantarflexion    Ankle inversion    Ankle eversion    (Blank rows = not tested)  BED MOBILITY:  Findings: Rolling to Right Max A Rolling to Left Max A  TRANSFERS: Sit to stand: Max A x 2 - just able to clear gluteals off mat  Assistive device utilized: HHA x2 to stand- dependent on hoyer for actual transfers     Stand to sit: Max A  Assistive device utilized: HHA x 2        GAIT: Findings: Non-Ambulatory  FUNCTIONAL TESTS:  None appropriate at this time  PATIENT SURVEYS:                                                                                                                                TREATMENT DATE: 03/06/2024  *Patient requested to stay in power w/c today for session.   Therex:  Extensive passive stretching/ROM to both LE today- starting with ankles- DF/PF/IV/EV/Circles then knee ext/flex, Hip IR/ER/flex/ext/abd/add/circles - sm diameter.  Knee flex (RTB resist)  3x 10 each LE Hip add (RTB) 3 x 10 BLE Hip Abd (RTB)  3 x 10 each LE AROM-hip flex 3 x 10 reps each LE Quad sets (hold 5 sec) x 10 ea LE Manual resistive leg press 2 x 10 reps each LE  PATIENT EDUCATION: Education details: Exercise technique; Plan for recert today Person educated: Patient and Parent Education method:  Explanation Education comprehension: verbalized understanding  HOME EXERCISE PROGRAM: Access Code: [email or web address] URL: [email or web address] Date: 03/23/2022 Prepared by: Reyes London   Exercises - Supine Bridge  - 3 x weekly - 3 sets - 10 reps - 2 hold - Supine Gluteal Sets  - 3 x weekly - 3 sets - 10 reps - 5 sec hold - Supine Quad Set  - 1 x daily - 3 x weekly - 3 sets - 10 reps - 5 hold - Seated Long Arc Quad  - 1 x daily - 7 x weekly - 3 sets - 10 reps - Seated Hip Adduction Squeeze with Ball  - 1 x daily - 3 x weekly - 3 sets - 10 reps - 5 hold - Seated Hip Abduction  - 1 x daily - 3 x weekly - 3 sets - 10 reps - 2 hold  GOALS: Goals reviewed with patient? Yes  SHORT TERM GOALS: Target date: 02/05/2024  Patient will be independent with updated HEP for improved overall LE strength in home Baseline: HEP continues to be updated as appropriate.  Goal status: NEW  LONG TERM GOALS: Target date: 03/18/2024 -Goal were updated/revised secondary to length  of chart.   Patient will perform sit to stand transfer with appropriate AD and max assist of 2 people with 75% consistency to prepare for pregait activities.  Baseline: 09/04/2023- Patient performed several Sit to stand requiring max A+2  with improving posture- able to ext R knee better - near terminal knee and able to clear bottom off mat- still not full but able to contract glutes for standing as erect as possible.  Goal status: ONGOING  2.  Patient will tolerate 2 minutes or more of standing in sit to stand lift or with max assist +2 in order to indicate improved lower extremity weightbearing tolerance for progression to standing in parallel bars  Baseline:  09/04/2023- Patient performed 3 trials of standing ranging from 17 -47 sec - improving right knee terminal knee ext and more activation upon command with gluteals- able to clear mat table. Continued to require Max A+2 and blocking knees with forearm/trunk support.    Goal status: ONGOING  3.  Patient will perform rolling left to right with Mod assist for improved independence with bed mobility to assist with dressing and positioning in bed.  Baseline: Dependent on caregiver to roll to left; 12/27/2023= Mod to max assist each direction (difficult due to RUE tone)  Goal status: Ongoing  4.  Patient will perform active bridging -able to clear bottom   Off mat for 10 reps with PT/caregiver holding his feet for improved trunk ext strength with standing and to assist with mobility/positioning.  Baseline: Unable to perform 10 reps with consistent clearing of bottom; 12/27/2023 = Patient able to minimally clear his glues off mat table - but successfully 10 reps today.  Goal status: MET  5. Patient will perform active bridging -able to clear bottom   Off mat for 3 sets of 10 reps with PT/caregiver holding his feet for improved trunk ext strength with standing and to assist with mobility/positioning.  Baseline: 12/27/2023- Patient able to complete 10 reps with max effort  Goal status: New     ASSESSMENT:  CLINICAL IMPRESSION: Patient presented with decreased tightness in RLE compared to last visit and patient reported feeling better after stretching and able to increase reps with resistive strengthening today. He was pleased with feeling much better after a frustrating week complaining of just feeling off and very tight.  Pt will continue to benefit from skilled physical therapy intervention to address impairments, improve QOL, and attain therapy goals.   OBJECTIVE IMPAIRMENTS: Abnormal gait, cardiopulmonary status limiting activity, decreased activity tolerance, decreased balance, decreased cognition, decreased coordination, decreased endurance, decreased knowledge of condition, decreased knowledge of use of DME, decreased mobility, difficulty walking, decreased ROM, decreased strength, hypomobility, impaired flexibility, impaired sensation, impaired UE functional  use, impaired vision/preception, postural dysfunction, obesity, and pain.   ACTIVITY LIMITATIONS: carrying, lifting, bending, sitting, standing, squatting, stairs, transfers, bed mobility, bathing, toileting, dressing, self feeding, reach over head, and hygiene/grooming  PARTICIPATION LIMITATIONS: meal prep, cleaning, laundry, medication management, personal finances, interpersonal relationship, driving, shopping, community activity, occupation, yard work, and church  PERSONAL FACTORS: Time since onset of injury/illness/exacerbation and 3+ comorbidities: DM, CVA, Seizures are also affecting patient's functional outcome.   REHAB POTENTIAL: Good  CLINICAL DECISION MAKING: Evolving/moderate complexity  EVALUATION COMPLEXITY: Moderate  PLAN:  PT FREQUENCY: 1-2x/week  PT DURATION: 12 weeks  PLANNED INTERVENTIONS: 97110-Therapeutic exercises, 97530- Therapeutic activity, [id number]- Neuromuscular re-education, 97535- Self Care, [id number]- Manual therapy, and Patient/Family education  PLAN FOR NEXT SESSION:  Continue with Standing frame Resume standing as able  Attempt standing frame if appropriate again Nustep for UE/LE strengthening Mat table dynamic sitting activities.    Reyes LOISE London, PT 03/07/2024, 9:47 PM

## 2024-03-11 ENCOUNTER — Ambulatory Visit: Admitting: Occupational Therapy

## 2024-03-11 ENCOUNTER — Ambulatory Visit

## 2024-03-13 ENCOUNTER — Ambulatory Visit

## 2024-03-13 ENCOUNTER — Ambulatory Visit: Admitting: Occupational Therapy

## 2024-03-13 DIAGNOSIS — I6389 Other cerebral infarction: Secondary | ICD-10-CM | POA: Diagnosis not present

## 2024-03-13 DIAGNOSIS — R32 Unspecified urinary incontinence: Secondary | ICD-10-CM | POA: Diagnosis not present

## 2024-03-18 ENCOUNTER — Ambulatory Visit

## 2024-03-18 ENCOUNTER — Ambulatory Visit: Admitting: Occupational Therapy

## 2024-03-18 DIAGNOSIS — I6930 Unspecified sequelae of cerebral infarction: Secondary | ICD-10-CM

## 2024-03-18 DIAGNOSIS — R2689 Other abnormalities of gait and mobility: Secondary | ICD-10-CM

## 2024-03-18 DIAGNOSIS — R262 Difficulty in walking, not elsewhere classified: Secondary | ICD-10-CM | POA: Diagnosis not present

## 2024-03-18 DIAGNOSIS — H543 Unqualified visual loss, both eyes: Secondary | ICD-10-CM

## 2024-03-18 DIAGNOSIS — M6281 Muscle weakness (generalized): Secondary | ICD-10-CM | POA: Diagnosis not present

## 2024-03-18 DIAGNOSIS — R278 Other lack of coordination: Secondary | ICD-10-CM | POA: Diagnosis not present

## 2024-03-18 DIAGNOSIS — R269 Unspecified abnormalities of gait and mobility: Secondary | ICD-10-CM | POA: Diagnosis not present

## 2024-03-18 DIAGNOSIS — R2681 Unsteadiness on feet: Secondary | ICD-10-CM | POA: Diagnosis not present

## 2024-03-18 NOTE — Therapy (Signed)
 OUTPATIENT PHYSICAL THERAPY NEURO TREATMENT  Patient Name: Paul Hall MRN: [id number] DOB:[birth year], 28 y.o., male Today's Date: 03/19/2024   PCP:  Morene Sous, PA-C  REFERRING PROVIDER: Morene Sous, PA-C  END OF SESSION:  PT End of Session - 03/18/24 1620     Visit Number 186    Number of Visits 200   date corrected from most recent recert   Date for Recertification  06/10/24    Authorization Type [employer]/ [place] [employer]    Authorization Time Period 01/04/21-03/29/21; Recert 03/24/2021-06/16/2021; Recert 09/15/2021- 12/08/2021; Recert 12/13/2021-03/07/2022    Progress Note Due on Visit 190    PT Start Time 1620    PT Stop Time 1700    PT Time Calculation (min) 40 min    Equipment Utilized During Treatment Gait belt    Activity Tolerance Patient tolerated treatment well    Behavior During Therapy WFL for tasks assessed/performed               Past Medical History:  Diagnosis Date   Diabetes mellitus (HCC)    Hypertension    Stroke [facility])    Past Surgical History:  Procedure Laterality Date   IVC FILTER PLACEMENT ([facility] HX)     LEG SURGERY     PEG TUBE PLACEMENT     TRACHEOSTOMY     Patient Active Problem List   Diagnosis Date Noted   Sepsis due to vancomycin  resistant Enterococcus species (HCC) 06/06/2019   SIRS (systemic inflammatory response syndrome) (HCC) 06/05/2019   Acute lower UTI 06/05/2019   VRE (vancomycin -resistant Enterococci) infection 06/05/2019   Anemia 06/05/2019   Skin ulcer of sacrum with necrosis of muscle (HCC)    Urinary retention    Type 2 diabetes mellitus without complication, with long-term current use of insulin  (HCC)    Tachycardia    Lower extremity edema    Acute metabolic encephalopathy    Obstructive sleep apnea    Morbid obesity with BMI of 60.0-69.9, adult (HCC)    Goals of care, counseling/discussion    Palliative care encounter    Sepsis (HCC) 04/27/2019   H/O insulin  dependent diabetes mellitus  04/27/2019   History of CVA with residual deficit 04/27/2019   Seizure disorder (HCC) 04/27/2019   Decubitus ulcer of sacral region, stage 4 (HCC) 04/27/2019    ONSET DATE: October 2020  REFERRING DIAG: Cerebral infarction, unspecified   THERAPY DIAG:  Muscle weakness (generalized)  Difficulty in walking, not elsewhere classified  Other lack of coordination  History of CVA with residual deficit  Unsteadiness on feet  Other abnormalities of gait and mobility  Abnormality of gait and mobility  Low vision, both eyes  Rationale for Evaluation and Treatment: Rehabilitation  SUBJECTIVE:  SUBJECTIVE STATEMENT:  Patient reports doing well with no new complaints at this time.    Pt accompanied by: family member  PERTINENT HISTORY: Paul Hall is a 28 y/o male who presents with severe weakness, quadriparesis, altered sensorium, and visual impairment s/p critical illness and prolonged hospitalization. Pt hospitalized in October 2020 with ARDS 2/2 COVID19 infection. Pt sustained a complex and lengthy hospitalization which included tracheostomy, prolonged sedation, ECMO. In this period pt sustained CVA and SDH. Pt has now been liberated from tracheostomy and G-tube. Pt has since been hospitalized for wound infection and UTI. Pt lives with parents at home, has hospital bed and left chair, hoyer lift transfers, and power WC for mobility needs. Pt needs heavy physical assistance with ADL 2/2 BUE contractures and motor dysfunction   PAIN:  Are you having pain? No  PRECAUTIONS: Fall  RED FLAGS: None   WEIGHT BEARING RESTRICTIONS: No  FALLS: Has patient fallen in last 6 months? No  LIVING ENVIRONMENT: Lives with: lives with their family Lives in: House/apartment Stairs:  Has following  equipment at home: Wheelchair (power) and hospital bed, hoyer lift, Sit to stand lift  PLOF: Independent  PATIENT GOALS: I want to walk  OBJECTIVE:  Note: Objective measures were completed at Evaluation unless otherwise noted.  DIAGNOSTIC FINDINGS: CLINICAL DATA:  Altered mental status.  History of COVID and stroke.   EXAM: CT HEAD WITHOUT CONTRAST   TECHNIQUE: Contiguous axial images were obtained from the base of the skull through the vertex without intravenous contrast.   COMPARISON:  CT head 11/11/2012   FINDINGS: Brain: Left cerebral convexity cortical hypodensity with associated cortical calcification extending from the frontal to the parietal lobe. Extensive area of volume loss and low-density compatible with chronic infarction. This is noted on prior reports from [facility]. Probable chronic watershed infarct. Additional small areas of hypodensity in the right frontal white matter consistent with chronic ischemia.   Negative for acute infarct. No acute hemorrhage or mass. Ventricle size normal without midline shift.   Vascular: Normal arterial flow voids.   Skull: Negative   Sinuses/Orbits: Mucosal edema paranasal sinuses with air-fluid level left sphenoid sinus. Negative orbit   Other: None   IMPRESSION: Extensive watershed type infarct over the left cerebral convexity with cortical calcification. This appears chronic. No associated hemorrhage, hydrocephalus, or midline shift.   These results were called by telephone at the time of interpretation on 05/09/2019 at 3:12 pm to provider [name] , who verbally acknowledged these results.     Electronically Signed   By: [name] M.D.   On: 05/09/2019 15:13  COGNITION: Overall cognitive status: Impaired   SENSATION: Light touch: WFL  COORDINATION: Delayed with BLE  EDEMA:  None observed  MUSCLE TONE: RLE: Moderate  POSTURE: rounded shoulders and forward head  LOWER EXTREMITY ROM:      Active  Right Eval Left Eval  Hip flexion    Hip extension    Hip abduction    Hip adduction    Hip internal rotation    Hip external rotation    Knee flexion    Knee extension    Ankle dorsiflexion    Ankle plantarflexion    Ankle inversion    Ankle eversion     (Blank rows = not tested)  LOWER EXTREMITY MMT:    MMT Right Eval Left Eval  Hip flexion    Hip extension    Hip abduction    Hip adduction    Hip internal rotation  Hip external rotation    Knee flexion    Knee extension    Ankle dorsiflexion    Ankle plantarflexion    Ankle inversion    Ankle eversion    (Blank rows = not tested)  BED MOBILITY:  Findings: Rolling to Right Max A Rolling to Left Max A  TRANSFERS: Sit to stand: Max A x 2 - just able to clear gluteals off mat  Assistive device utilized: HHA x2 to stand- dependent on hoyer for actual transfers     Stand to sit: Max A  Assistive device utilized: HHA x 2        GAIT: Findings: Non-Ambulatory  FUNCTIONAL TESTS:  None appropriate at this time  PATIENT SURVEYS:                                                                                                                                TREATMENT DATE: 03/18/2024      Therex:  Trunk Ext (resisted with stiff large GTB) 2 x 15 reps  Diagonal cross punch- to improve trunk control/ROM and UE strength  and coodination 2 x 10 each UE.  Knee flex (RTB resist)  3x 10 each LE Hip add manual resistive hold for 5 sec 2 x 10 BLE Hip Abd manual resistive hold for 5 sec 2 x 10 each LE AROM-hip flex 3 x 10 reps each LE Quad sets (hold 5 sec) x 10 ea LE Manual resistive leg press 2 x 10 reps each LE  PATIENT EDUCATION: Education details: Exercise technique; Plan for recert today Person educated: Patient and Parent Education method: Explanation Education comprehension: verbalized understanding  HOME EXERCISE PROGRAM: Access Code: [email or web address] URL:  [email or web address] Date: 03/23/2022 Prepared by: Reyes London   Exercises - Supine Bridge  - 3 x weekly - 3 sets - 10 reps - 2 hold - Supine Gluteal Sets  - 3 x weekly - 3 sets - 10 reps - 5 sec hold - Supine Quad Set  - 1 x daily - 3 x weekly - 3 sets - 10 reps - 5 hold - Seated Long Arc Quad  - 1 x daily - 7 x weekly - 3 sets - 10 reps - Seated Hip Adduction Squeeze with Ball  - 1 x daily - 3 x weekly - 3 sets - 10 reps - 5 hold - Seated Hip Abduction  - 1 x daily - 3 x weekly - 3 sets - 10 reps - 2 hold  GOALS: Goals reviewed with patient? Yes  SHORT TERM GOALS: Target date: 02/05/2024  Patient will be independent with updated HEP for improved overall LE strength in home Baseline: HEP continues to be updated as appropriate.  Goal status: NEW  LONG TERM GOALS: Target date: 03/18/2024 -Goal were updated/revised secondary to length of chart.   Patient will perform sit to stand transfer with appropriate AD and max assist of 2 people with 75% consistency to prepare  for pregait activities.  Baseline: 09/04/2023- Patient performed several Sit to stand requiring max A+2  with improving posture- able to ext R knee better - near terminal knee and able to clear bottom off mat- still not full but able to contract glutes for standing as erect as possible.  Goal status: ONGOING  2.  Patient will tolerate 2 minutes or more of standing in sit to stand lift or with max assist +2 in order to indicate improved lower extremity weightbearing tolerance for progression to standing in parallel bars  Baseline:  09/04/2023- Patient performed 3 trials of standing ranging from 17 -47 sec - improving right knee terminal knee ext and more activation upon command with gluteals- able to clear mat table. Continued to require Max A+2 and blocking knees with forearm/trunk support.   Goal status: ONGOING  3.  Patient will perform rolling left to right with Mod assist for improved independence with  bed mobility to assist with dressing and positioning in bed.  Baseline: Dependent on caregiver to roll to left; 12/27/2023= Mod to max assist each direction (difficult due to RUE tone)  Goal status: Ongoing  4.  Patient will perform active bridging -able to clear bottom   Off mat for 10 reps with PT/caregiver holding his feet for improved trunk ext strength with standing and to assist with mobility/positioning.  Baseline: Unable to perform 10 reps with consistent clearing of bottom; 12/27/2023 = Patient able to minimally clear his glues off mat table - but successfully 10 reps today.  Goal status: MET  5. Patient will perform active bridging -able to clear bottom   Off mat for 3 sets of 10 reps with PT/caregiver holding his feet for improved trunk ext strength with standing and to assist with mobility/positioning.  Baseline: 12/27/2023- Patient able to complete 10 reps with max effort  Goal status: New     ASSESSMENT:  CLINICAL IMPRESSION: Patient continues to work on his BLE strength in clinic He was able to achieve some more active R LE movement and more power today with leg press. He struggled at time with maintaining pressure mostly with RLE hip abd overall today.   Pt will continue to benefit from skilled physical therapy intervention to address impairments, improve QOL, and attain therapy goals.   OBJECTIVE IMPAIRMENTS: Abnormal gait, cardiopulmonary status limiting activity, decreased activity tolerance, decreased balance, decreased cognition, decreased coordination, decreased endurance, decreased knowledge of condition, decreased knowledge of use of DME, decreased mobility, difficulty walking, decreased ROM, decreased strength, hypomobility, impaired flexibility, impaired sensation, impaired UE functional use, impaired vision/preception, postural dysfunction, obesity, and pain.   ACTIVITY LIMITATIONS: carrying, lifting, bending, sitting, standing, squatting, stairs, transfers, bed  mobility, bathing, toileting, dressing, self feeding, reach over head, and hygiene/grooming  PARTICIPATION LIMITATIONS: meal prep, cleaning, laundry, medication management, personal finances, interpersonal relationship, driving, shopping, community activity, occupation, yard work, and church  PERSONAL FACTORS: Time since onset of injury/illness/exacerbation and 3+ comorbidities: DM, CVA, Seizures are also affecting patient's functional outcome.   REHAB POTENTIAL: Good  CLINICAL DECISION MAKING: Evolving/moderate complexity  EVALUATION COMPLEXITY: Moderate  PLAN:  PT FREQUENCY: 1-2x/week  PT DURATION: 12 weeks  PLANNED INTERVENTIONS: 97110-Therapeutic exercises, 97530- Therapeutic activity, 97112- Neuromuscular re-education, 97535- Self Care, [id number]- Manual therapy, and Patient/Family education  PLAN FOR NEXT SESSION:  RECERT VISIT DUE NEXT VISIT Continue with Standing frame Resume standing as able  Attempt standing frame if appropriate again Nustep for UE/LE strengthening Mat table dynamic sitting activities.    Reyes LOISE London, PT  03/19/2024, 4:04 PM

## 2024-03-18 NOTE — Therapy (Signed)
 . Occupational Therapy Progress/Recertification Note  Dates of reporting period  01/08/24   to   03/18/24     Patient Name: Paul Hall MRN: [id number] DOB:[birth year], 28 y.o., male Today's Date: 03/18/2024  PCP: Dr. Bertell REFERRING PROVIDER: Dr. Bertell   .  OT End of Session - 03/18/24 1539     Visit Number 120    Number of Visits 180    Date for Recertification  06/10/24    OT Start Time 1530    OT Stop Time 1615    OT Time Calculation (min) 45 min    Equipment Utilized During Treatment tilt in space power wc    Activity Tolerance Patient tolerated treatment well    Behavior During Therapy WFL for tasks assessed/performed            Past Medical History:  Diagnosis Date   Diabetes mellitus (HCC)    Hypertension    Stroke [facility])    Past Surgical History:  Procedure Laterality Date   IVC FILTER PLACEMENT ([facility] HX)     LEG SURGERY     PEG TUBE PLACEMENT     TRACHEOSTOMY     Patient Active Problem List   Diagnosis Date Noted   Sepsis due to vancomycin  resistant Enterococcus species (HCC) 06/06/2019   SIRS (systemic inflammatory response syndrome) (HCC) 06/05/2019   Acute lower UTI 06/05/2019   VRE (vancomycin -resistant Enterococci) infection 06/05/2019   Anemia 06/05/2019   Skin ulcer of sacrum with necrosis of muscle (HCC)    Urinary retention    Type 2 diabetes mellitus without complication, with long-term current use of insulin  (HCC)    Tachycardia    Lower extremity edema    Acute metabolic encephalopathy    Obstructive sleep apnea    Morbid obesity with BMI of 60.0-69.9, adult (HCC)    Goals of care, counseling/discussion    Palliative care encounter    Sepsis (HCC) 04/27/2019   H/O insulin  dependent diabetes mellitus 04/27/2019   History of CVA with residual deficit 04/27/2019   Seizure disorder (HCC) 04/27/2019   Decubitus ulcer of sacral region, stage 4 (HCC) 04/27/2019   ONSET DATE: 01/2019  REFERRING DIAG: CVA/COVID-19  THERAPY  DIAG:  Muscle weakness (generalized)  Rationale for Evaluation and Treatment Rehabilitation  SUBJECTIVE:    SUBJECTIVE STATEMENT:   Pt.  Reports that cold weather makes him feel tighter.   Pt accompanied by: self and family member  PERTINENT HISTORY:  Pt. is a 28 y.o. male who was diagnosed with COVID-19, and CVA with resultant quadriplegia. Pt. Was then hospitalized with VRE UTI. PMHx includes: urinary retention, seizure disorder, obstructive sleep disorder, DM Type II, Morbid obesity.   PRECAUTIONS: None  WEIGHT BEARING RESTRICTIONS No  PAIN:   03/06/24: No pain  FALLS: Has patient fallen in last 6 months? No  LIVING ENVIRONMENT: Lives with: lives with their family Lives in: House/apartment Stairs: No Level Entry Has following equipment at home: Wheelchair (power) and hoyer lift, sit to stand lift  PLOF: Independent  PATIENT GOALS: To be able to engage in more daily care tasks.  OBJECTIVE:   HAND DOMINANCE: Right  ADLs:  Transfers/ambulation related to ADLs: Eating: Pt. reports being able to hold standard utensils, and is starting to engage more in self-feeding tasks, hand to mouth patterns. Pt. Reports that he does as much as he can with the task, and family assists  with the remainder of the task. Grooming: Pt. is able to initiate holding an electric toothbrush, and  brush his teeth. Family assists LB Dressing: Total Assist UE dressing: Pt. is now able to reach up to actively assist with grasping , and pulling his gown down. Toileting:  Total Assist Bathing: MaxA UB, Total assist LB Tub Shower transfers: N/A Equipment: See above    IADLs: Shopping: Relies on family to assist Light housekeeping: Total Assist Meal Prep: Total Assist Community mobility:   Medication management:  Total Assist  Financial management: N/A Handwriting: Not legible: Pt. Is able to hold a pen with the left hand, and initiate marking the page. Pt.'s eye glasses were not  available  MOBILITY STATUS:  Power w/c  POSTURE COMMENTS:  Pt. Requires position changes in his power w/c  ACTIVITY TOLERANCE: Activity tolerance:  Fair  FUNCTIONAL OUTCOME MEASURES: FOTO: 40 TR score: 45  05/25/2022:   FOTO: 50 TR score: 45  07/20/22: FOTO 55  03/08/23: 51  UPPER EXTREMITY ROM     Active ROM Left eval Left  03/21/2022 Left 05/25/2022 L  07/20/22 Left 07/25/2022 Left  08/15/2022 Left  09/26/2022 Left  11/16/2022 Left 12/28/2022 Left 03/13/23 Left 05/10/23 Left 10/23/23 Left 12/27/23 Left 03/18/24  Shoulder flexion 119 123 125 130  136 138 130 142 144 144 148 148 150  Shoulder abduction 110 115 115 115  118 121 125 142 140 140 140 140 145  Shoulder adduction                Shoulder extension                Shoulder internal rotation                Shoulder external rotation                Elbow flexion 135(135) 145 145 145  145    145 145  145 146  Elbow extension -27(-18) -21(-20) -20(-16)  -25(-10) -23(-10) -21(-10) -20(-18) -20(-18) -20(-17) -17(-16) -12(-8) -12(-6) -8(-4)  Wrist flexion                Wrist extension 10(50) 19(50) 28(50)  30(50) 35(55) 36(55) 36(60) 40(60) 45(60) 45(60) 42(54) 36(54) 48(58)  Wrist ulnar deviation                Wrist radial deviation                Wrist pronation            60(74) 84(84) 90(90)  Wrist supination   Limited by flexor tone       10(15)  30(38) 20(38) 26(38)  (Blank rows = not tested)    Active ROM Right eval Right 03/21/2022 Right 05/25/2022 R  07/20/22 Right  07/25/22 Right 08/15/2022 Right 09/26/2022 Right 11/16/2022 Right 12/28/2022 Right 03/13/23 Right 05/10/23 Right 10/23/23 Right  9/24 /25 Right 03/18/24  Shoulder flexion 106 scaption 105  Scaption 62 flexion 110 scaption 100  105 105 105 112 Scaption 123 Scaption 122  Scaption 142 Scaption 142 144  Shoulder abduction 114 90 97 100  105 117 120 124 124 122 144 146 140  Shoulder adduction                Shoulder extension                 Shoulder internal rotation                Shoulder external rotation  Elbow flexion 120(130) 145 145 140  144 140 142 148 148 145  145 148  Elbow extension -45(-35) -22(-35) -28(-22)  -32(-21) -32(-28) -28(-26) -28(-28) -27(-26) -27(-40) -38(-35) -34(-26) -34(-26) -36(-25)  Wrist flexion                Wrist extension -30(10) -25(10) -10(30)  -10(20) -10(25) -20(40) -30(10) -22(34) -22(35) -32(22) -30(-8) -38(-6) -36  Wrist ulnar deviation                Wrist radial deviation                Wrist pronation                Wrist supination   Limited by flexor tone                  12/28/2022: Thumb radial abduction: Right: 12(46), Left: 40(54)  Digits: Bilateral 2nd through 5th digit: MP Hyperextension with PIP, an DIP flexion    10/23/23:  Bilateral 2nd through 5th digit MP hyperextension with PIP, and DIP flexion: Pt. has been intermittently able to initate active diigt PIP, and DIP extension following Botox during this past recertification/progress reporting period.     UPPER EXTREMITY MMT:     Left eval Left 03/21/2022 Left  05/25/2022 L 07/20/22 Left  08/15/2022 Left 09/26/2022 Left 11/16/2022 Left 12/28/2022 Left  03/13/2023 Left  05/10/23 Left 10/23/23 Left 12/27/23 Left 03/18/24  Shoulder flexion 3-/5 3/5 3+/5 4+/5 4+/5 4+/5 4+/5 4+/5 4+/5 4+/5 4+/5 4+/5 4+/5  Shoulder abduction 3-/5 3-/5 4-/5 4+/5 4+/5 4+5 4+/5 4+/5 4+/5 4+/5 4+/5 4+/5 4+/5  Shoulder adduction               Shoulder extension               Shoulder internal rotation               Shoulder external rotation               Elbow flexion 3/5 3+/5 4-/5 4+/5 5/5 5/5 5/5 5/5 5/5 5/5 5/5 5/5 5/5  Elbow extension 2-/5 2/5  4+/5 4+/5 4+/5  4/5 4/5 4/5 through available range 4+/5 through available range 4+/5 4+/5  Wrist flexion               Wrist extension 2/5 2+/5 3-/5  3-/5 3-/5 3-/5 3/5 3/5 3/5 3+/5 3-/5 3-/5  Wrist ulnar deviation               Wrist radial deviation               Wrist  pronation               Wrist supination               (Blank rows = not tested)  Right eval Right 03/21/2022 Right  05/25/2022 R 07/20/22 Right 08/15/2022 Right 09/26/2022 Right 11/16/2022 Right 12/28/2022 Right 03/13/2023 Right 05/10/23 Right 10/23/23 Right 12/27/23 Right 03/18/24  Shoulder flexion 3-/5 scaption 3-/5 3-/5 3+/5 3+/5 3+/5 3/5 3/5 3/5 3+/5 4-/5 4/5 4/5  Shoulder abduction 3-/5 3-/5 3-/5 4/5 4/5 4/5 4/5 4/5 4/5 4/-5 4-/5 4/5 4/5  Shoulder adduction               Shoulder extension               Shoulder internal rotation               Shoulder  external rotation               Elbow flexion 3/5 3/5 3+/5 4/5 4+/5 4+/5 4+/5 4+/5 4+/5 4+/5 4+/5 5/5 5/5  Elbow extension 2-/5 2/5 3-/5 4+/5 4+/5 4+/5 4+/5 4-/5 4-/5 4-/5 4-/5 through available range 3-/5 3-/5  Wrist flexion               Wrist extension 2-/5 2-/5 2/5 2/5 2/5 2/5 2/5 2/5 2/5 2/5 2/5 2-/5 2-/5  Wrist ulnar deviation               Wrist radial deviation               Wrist pronation               Wrist supination                 HAND FUNCTION:  Grip strength: Right: 0 lbs; Left: 0 lbs and Lateral pinch: Right: 5 lbs, Left: 2 lbs  03/21/2022: Lateral pinch: Right: 3.5 lbs, Left: 2 lbs  07/20/22: Grip strength: Right: 3 lbs; Left: 4 lbs and Lateral pinch: Right: 5 lbs, Left: 3 lbs  08/15/2022:  Grip strength: Right: 0 lbs; Left: 5 lbs and Lateral pinch: Right: 2 lbs, Left: 4 lbs  09/26/2022  Grip strength: Right: 0 lbs; Left: 8 lbs and Lateral pinch: Right: 2 lbs, Left: 5 lbs  11/16/2022  Grip strength: Right: 0 lbs; Left: 8 lbs  9/25/20204  Grip strength: Right: 0 lbs; Left: 8 lbs  Grip strength: Right: 0 lbs; Left: 8 lbs   03/08/23:  Grip strength: Right: 0 lbs without PROM, 5 lbs after PROM, Left 8 lbs  05/10/23:  Grip strength: TBD  Bilateral digit PIP/DIP flexion contractures with MP hyperextension with attempts for AROM. Pt. is able to tolerate AROM to the bilateral digits at the initial  evaluation however, has a history of pain in the digits.  COORDINATION: Eval: Pt. is unable to grasp 9-hole test pegs. Pt. is able to initiate grasping larger pegs, and is able to hold a pen in the left hand.  07/20/22: 2 min 36 seconds to remove 9 pegs from 9 hole peg test - cues to locate pegs 2/2 low vision. Pt. is able to initiate grasping larger pegs on R hand and is able to hold a pen in the left hand.  08/15/2022:    Vision, and sensation limiting accuracy of 9 hole peg test results. Pt. Was able to grasp and remove vertical pegs intermittently with cues.    05/10/23: TBD  03/18/24:  Box and Blocks:  Right:  0 blocks completed in 1 min., Left: 7 blocks completed in 1 min.  SENSATION: Light touch: Impaired   EDEMA:  N/A  MUSCLE TONE: BUE flexor Spasticity  COGNITION Overall cognitive status: Continue to assess in functional context  VISION:   Subjective report: Pt. was not wearing glasses at the time of the initial eval.  Baseline vision: Vision is very limited. Wears glasses all the time Visual history: History of impaired vision following CVA. Pt. Has received treatment through the [facility] low vision rehabilitation program.   VISION ASSESSMENT: Impaired To be further assessed in functional context  PERCEPTION: Impaired   PRAXIS: Impaired: motor planning  OBSERVATIONS:  Pt reports being on Tramadol   TODAY'S TREATMENT : 03/18/24  Measurements were obtained, and goals were reviewed with the Pt.  PATIENT EDUCATION: Education details: ROM, BUE tone  Person educated: Patient and Parent Education method:  Explanation Education comprehension: verbalized understanding  HOME EXERCISE PROGRAM:    Incorporating  the right hand during self-feeding tasks.   GOALS: Goals reviewed with patient? Yes  SHORT TERM GOALS: Target date: 04/29/2024     To assess splint fit, and make appropriate adjustments to promote good skin integrity through the palmar surface of the  bilateral hands.  Baseline: 05/25/22: Goal currently met, however ongoing as needs to assess splint fit arise. 03/23/2022: Pt. is wearing splints a couple of hours at night bilateral resting hand splints. 03/21/2022: Pt. is wearing splints a couple of hours at night bilateral resting hand splints. Goal status: Deferred   LONG TERM GOALS: Target date: 06/10/2024     FOTO score will Improve by 2 points for Pt. perceived improvement with the assessment specific ADL/IADL tasks.  Baseline: 03/08/23: 51; 12/28/2022: FOTO score: 56; 11/16/2022: 52 09/27/2022: 51 07/20/2022: FOTO 55 05/25/2022: FOTO score: 50,  TR score: 45 Eval: FOTO score: 40, TR score: 45 Goal status: Achieved   2.   Pt. will independently perform oral care for 100% of the task after complete set-up. Baseline: 05/10/23: Pt. Performs 90% of the task. Pt.'s mother assists with 10% of the task for thorough cleaning the hard to reach places way in the back of the oral cavity.03/08/23: not as much assist required proximally, but to adjust positioning of toothbrush; 02/13/23: Pt. Continues to require assist proximally 01/09/2023: Continue 12/28/2022:  Pt. Continues to complete 90% of the task with complete set-up, with visual cues, and occassional assist of the right elbow. 11/16/2022: Pt. Completes 90% of the task with complete set-up. 09/26/2022: Completes 90% of the task, proximal assist at times required at the elbow.  08/15/2022: Completes 90% of the task, proximal assist at times required at the elbow. 07/20/22: completes 90% of task, limited by shoulder flexion. 05/25/2022:  Pt. Is able to initiate and perform oral care for approximately 90% of the task. Complete set-up required. Assi needed only for the very back teeth. 03/23/2022: Pt. Is able to initiate and perform oral care for approximately 75% of the task. Pt. Requires assist at proximally at the elbow for through oral care. 03/21/2022: Pt. Is able to initiate and perform oral care for approximately  75% of the task. Pt. Requires assist at proximally at the elbow for through oral care. Eval: Pt. is able to initiate using an electric toothbrush. Pt. requires assist for set-up, and assist for thoroughness, and as he Pt. fatigues. Goal status:  Partially met, Goal discontinued  3.  Pt. Will be modified independence with self-feeding for 100% using a swivel spoon, and standard fork after complete set-up Baseline: 05/10/2023: Pt. feeds self 95-100% of the meal. with occasional assist to reposition the utensil. 03/08/23: indep with cup with a handle and lid, assist to reposition eating utensils in hand;  02/13/23:  Pt. Is now able to consistently, and efficiently use a swivel spoon for eating cereal with the left hand.01/09/2023: Continue 12/28/2022: Pt. Is now feeding himself 90% of the time using a swivel spoon with the left hand, and 95% of the time with the fork. Pt. Continues to have difficulty with cutting food. 11/16/2022: 100% with finger foods, Pt. Is initiating with a swivel spoon, and universal cuff for fork use, however requires assist for consistency, and accuracy.  09/26/2022: 90% using the spoon, assist at time with the forearm motion, and grip on the utensils. 100% for finger foods.  08/15/2022:  90% using the spoon, assit at time with the  forearm motion, and grip on the utensils. 100% for finger foods-independent with set-up- unsupervised.  07/20/22: self-feeds cereal using spoon 90% of task. 05/25/2022: Pt. Is able to use a spoon to scoop cereal when feeding himself cereal 85% of the time. Pt. Is able to feed himself snack/finger foods 100% of the time. Pt. Continues to work on consistency of  stabilizing a cup/mug when drinking. Pt. Is able to grasp a water  bottle with assist initially, with assist tapering off as he drinks.03/23/2022: Pt. Is able to perform scooping cereal for 75% of the time. Pt. required assist, and support at the left elbow, and Pt. Presents with limited forearm supination when  using the spoon, and bringing it towards his mouth. Pt. Is able to use a fork to spear items, and perform the hand to mouth pattern.  03/21/2022: Pt. Is able to perform scooping cereal for 75% of the time. Pt. required assist, and support at the left elbow, and Pt. presents with limited forearm supination when using the spoon, and bringing it towards his mouth. Pt. Is able to use a fork to spear items, and perform the hand to mouth pattern.  Eval: Pt. is able to hold standard standard utensils. Pt. Performs as much of the task as he, can and has assistance for the remainder. Goal status:  Improved, Achieved  4.  Pt. will improve grasp patterns and consistently grasp 1/4 objects for ADL, and IADL tasks.  Baseline: 03/18/24: Pt. is able to grasp thin objects including chips, and crackers, and dics. Pt. Continues to work towards grasping progressively smaller objects. 01/08/24: Continue, carry goal over from recertification update last session. 12/27/23: Pt. is able to present with increased tightness  2/2 being way overdue for Botox injections, which limits consistency with grasping small objects. 10/23/2023: Pt. is able to initiate grasping small objects visual cues, and increased time, however continues to work towards grasping 1/4 objects efficiently. 08/27/23: Pt. is able to grasp flat discs, and sustain a secure hold on them while moving the UE through various planes. 08/07/23: Continue 05/31/2023: Continue 05/10/23: Pt. is consistently able to grasp 1/2 objects with visual cues. Pt. Requires assist, and increased visual cues to grasp 1/4 objects on an elevated plane.  Pt. Is unable to grasp the flat objects at the tabletop surface. Pt. Is grasping grasping small puppy vitamins.03/08/23: 1/2 objects efficiently; 02/13/2023: 1/2 objects efficiently 01/09/2023: Continue 12/28/2022: Continues to grasp 1/4 pegs with min a + visual cues, consistently grasping 1/2 objects with visual cues 11/16/2022: Continues to  grasp 1/4 pegs with min a + visual cues, consistently grasping 1/2 objects with visual cues 09/26/2022: Continues to grasp 1/4 pegs with min a + visual cues, consistently grasping 1/2 objects with visual cues. 08/15/2022: grasps 1/4 pegs with min a + visual cues, consistently grasping 1/2 objects with visual cues. 07/20/22: grasps 1/4 pegs with min a + visual cues, consistently grasping 1/2 objects with visual cues. 05/25/2022: Pt. Is working on improving consistency of grasping 1/2 objects with visual cues.  03/23/2022: Pt. Is able to grasp 1 objects consistently,and continues to work on the hand patterns needed to grasp 1/2 objects.03/21/2022: Pt. Is able to grasp 1 objects consistently,and continues to work on the hand patterns needed to grasp 1/2 objects. Eval: Pt. is able to grasp 1 objects intermittently using a lateral grasp pattern. Goal status: Ongoing  5.  Pt. will independently write his name legibly with letter sizes under 1. Baseline:  05/10/23: Deferred 03/08/23: Not addressed this period;  02/13/2023: Continue 01/09/2023: Continue. 12/28/2022: Pt. is now using an IPad Pen. 11/16/2022: Pt. Continues to be able to write name with smaller letter size. Pt. Is signing name with a computer styus. Pt. Requires visual cues, and assist 09/26/2022: Pt. Continues to be able to write name with smaller letter size. Pt. Is signing name with a computer styus. Pt. Requires visual cues, and assist. 08/15/2022: Pt. Is able to write name with smaller letter size. Pt. Is signing name with a computer styus. Pt. Requires visual cues, and assist. 07/20/22: stabilizing assist to write name with 75% legibility with 2 letters. 05/25/2022: Pt. to continue to work towards formulating grasp patterns in preparation for grasping a large width pen. Pt. Requires visual cues. 03/23/2022: Pt. Is able to write his name with modA, however has difficulty with formulating letter sizes less than 2 in size with 50% legibility for the  3 letters of his name.03/21/2022: Pt. Is able to write his name with modA, however has difficulty with formulating letter sizes less than 2 in size with 50% legibility for the 3 letters of his name. Eval: Pt is able to hold a thin marker with his left hand, and formulate a line, and initiate a circular pattern (Pt. without glasses today) Goal status: Deferred  6. Pt. Will reach up to comb/brush his hair with minA.  Baseline: 05/10/23: Pt. is able to use a pick independently with the left hand with occasional assist to the far side of the left his head. 03/08/23: Pt uses pick in L hand to reach 75% of his head; unable to reach R side of head; 02/13/2023:Pt. is able to use a hair pick with the left hand on the right side of his head, top, and back of the head. Pt. Continues to have difficulty reaching further to the right side of his head with the left. 01/09/2023: Continue 12/28/2022: Pt. Is progressing, and is now able to use a hair pick with the left hand on the right side of his head, top, and back of the head. Pt. Continues to have difficulty reaching further to the right side of his head with the left. 11/16/2022: Pt. Is able to use a hair pick for the left side of his head using his left hand. Pt. Presents with difficulty reaching to the right side of his head.09/26/2022: Pt. Is able to use a hair pick for the left side of his head using his left hand. Pt. Presents with difficulty reaching to the right side of his head. 5/13/202: 75% using a hair pick, assist to the right side of the head. 07/20/22: reaches 75% of head, assist for far R side of head, fatigues quickly. 05/25/2022: Pt. Is able to reach up with the left hand to the left side, top, and back of his head. 03/23/2022: Pt. is now able to more consistently initiate reaching up to his head with his left hand in preparation for haircare12/18/2023: Pt. is now able to more consistently initiate reaching up to his head with his left hand in preparation for  haircare. Eval: Pt. is able to initiate reaching up for hair care with a long handled brush, however is unable to sustain UE's in elevation to perform the task.     Goal status: Achieved  7. Pt. Will independently navigate the w/c through his environment with minA with visual scanning, and hand placement on the controls.  Baseline: 11/16/2022: Deferred 2/2 vision 09/26/2022: Pt. Is now navigating his w/c ouside the home, and around his  block. 08/15/2022: Pt. Requires minA to setup hand on controls and MIN cues to navigate the w/c in wide spaces, requires MIN - MOD cues to navigate the w/c through more narrow doorways, and tighter turns - varies based on good vs bad vision days.07/20/22: Pt. Requires minA to setup hand on controls and MIN cues to navigate the w/c in wide spaces, requires MIN - MOD cues to navigate the w/c through more narrow doorways, and tighter turns - varies based on good vs bad vision days. 05/25/2022: Pt. Requires minA  and  cues to navigate the w/c in wide spaces, and requires Mod cues to navigate the w/c through more narrow doorways, and tighter turns. Pt. Requires max cues for scanning through the environment, and moderate cues for hand placement on the controls.     Goal status: Deferred 2/2 vision    8. Pt. Will improve bilateral grip strength to be able to independently grasp, and pull up blankets, and linens.   Baseline: 05/10/23: Deferred 03/08/23: R grip 0-5 lbs, L grip 8 lbs; pt reports he can consistently pull up blankets and linens independently.  02/13/2023: Continue. Pt. presents with digit PIP, and DIP flexor tightness. 01/09/2023: Continue 12/28/2023: R: 0#, L: 5# Pt. Is now able to more consistently grasp and pull blankets up over him. 11/16/2022: R: 0 L: 8# Pt. Is more consistently grasping his blankets and attempting to pull them up. 09/26/2022: R: 0  L: 8# Pt. is attempting to grasp, and pull blankets up more, using mostly the left hand. 08/15/2022: Pt. has difficulty securely  holding, and pulling up blankets, and linens.    Goal status:Deferred  9. Pt. will consistently actively control the releasing of blankets, covers, and linens from his hands once they are in the desired position over him. Baseline: 12/28/2022: Pt. Is consistently actively able to release linens once they are in the desired position over him. 11/16/2022: Pt. continues to improve with actively releasing blankets form his hands. 09/26/2022: Pt. is improving with actively releasing blankets form his hands. 08/15/2022: Pt. has difficulty with controlled releasing of blankets/linens from his grasp.     Goal status: Achieved    10. Pt. Will improve bilateral UE strength by 2 MM grades to assist with ADLs, and IADLs  Baseline: 03/18/24: Right: shoulder flexion: 4/5, abduction: 4/5, elbow flexion: 5/5 , extension: 3-/5 wrist extension: 2-/5; Left: shoulder flexion: 4+/5, abduction: 4+/5, elbow flexion: 5/5 , extension: 4+/5  wrist extension: 3+/5   01/08/24: Continue, carry goal over from recertification update last session. 12/27/23:  Right: shoulder flexion: 4/5, abduction: 4/5, elbow flexion: 5/5 , extension: 3-/5 wrist extension: 2-/5; Left: shoulder flexion: 4+/5, abduction: 4+/5, elbow flexion: 5/5 , extension: 4+/5  wrist extension: 3+/5  08/27/23:  Right: shoulder flexion: 3+/5, abduction: 4-/5, elbow flexion: 4+/5 , extension: 4-/5 wrist extension: 2/5; Left: shoulder flexion: 4+/5, abduction: 4+/5, elbow flexion: 5/5 , extension: 4+/5  wrist extension: 3-/5  10/23/2023: Right: shoulder flexion: 4-/5, abduction: 4-/5, elbow flexion: 4+/5 , extension: 4-/5 wrist extension: 2/5; Left: shoulder flexion: 4+/5, abduction: 4+/5, elbow flexion: 5/5 , extension: 4+/5  wrist extension: 3+/5  08/27/23:  Right: shoulder flexion: 3+/5, abduction: 4-/5, elbow flexion: 4+/5 , extension: 4-/5 wrist extension: 2/5; Left: shoulder flexion: 4+/5, abduction: 4+/5, elbow flexion: 5/5 , extension: 4/5  wrist extension: 3/5  08/07/23:  Continue 05/31/2023: Continue 05/10/23: Right: shoulder flexion: 3+/5, abduction: 4-/5, elbow flexion: 4+/5 , extension: 4-/5 wrist extension: 2/5; Left: shoulder flexion: 4+/5, abduction: 4+/5, elbow flexion: 5/5 ,  extension: 4/5  wrist extension: 3/5 03/13/2023: Right: shoulder flexion: 3/5, abduction: 4/5, elbow flexion: 4+/5 , extension: 4-/5 wrist extension: 2/5; Left: shoulder flexion: 4+/5, abduction: 4+/5, elbow flexion: 5/5 , extension: 4/5  wrist extension: 3/5 03/08/23: NT this date d/t as pt arrived late; 02/13/2023: Continue 01/09/2023: Continue 12/28/2022: Right: shoulder flexion: 3/5, abduction: 4/5, elbow flexion: 4+/5 , extension: 4-/5 wrist extension: 2/5; Left: shoulder flexion: 4+/5, abduction: 4+/5, elbow flexion: 5/5 , extension: 4/5  wrist extension: 3/5 Right: shoulder flexion: 3+/5, abduction: 4/5, elbow flexion: 4+/5 , extension: 4+/5 wrist extension: 2/5; Left: shoulder flexion: 4+/5, abduction: 4+/5, elbow flexion: 5/5 , extension: 4+/5 wrist extension: 3-/5 11/16/2022: Right: shoulder flexion: 3/5, abduction: 4/5, elbow flexion: 4+/5 , extension: 4+/5 wrist extension: 2/5; Left: shoulder flexion: 4+/5, abduction: 4+/5, elbow flexion: 5/5 , extension:  wrist extension: 3-/5 Right: shoulder flexion: 3+/5, abduction: 4/5, elbow flexion: 4+/5 , extension: 4+/5 wrist extension: 2/5; Left: shoulder flexion: 4+/5, abduction: 4+/5, elbow flexion: 5/5 , extension: 4+/5 wrist extension: 3-/5 Goal status: Ongoing  11. Pt. will improve right wrist extension by 10 degrees to initiate reaching for items in preparation for ADLs. Baseline:03/18/24:  Right wrist extension: -36 Right wrist extension: -38(-6) 01/08/24: Continue, carry goal over from recertification update last session.  12/27/23: Right wrist extension: -38(-6) 10/23/23: -30(-8) 08/23/2023: R Wrist Ext: -31(-8)  08/07/23: Continue 05/31/23: Continue 05/10/23: Right wrist extension: -32(22) 03/13/23: -22(35) 03/08/23: NT this date as pt arrived  late; 02/13/2023: Continue 01/09/2023: Continue 12/28/2022: -22(34) 11/16/2022: -30(10)    Goal status: Ongoing      12. Pt. Will require donn/doff a Tank top T-shirt efficiently with supervision and full set-up.    Baseline: 03/18/24: Independent doffing, Pt. Reports assist is required for pulling the T-shirt Tank down in the back.01/08/24: Continue, carry goal over from recertification update last session. 12/27/23: Independent doffing, MaxA doffing 10/23/23: Independent doff the shirt/tank, MaxA  donning. 10/23/23: Pt. Is able to independently doff his shirt, requiring assist to lean forward and loosen the back. 08/07/23: Continue 05/31/2023: Pt. Requires maxA donning a pullover sweatshirt, Mod-maxA doffing it. T-shirt tank TBD 05/10/23: Pt. Presents with difficulty completing, and requires increased time to complete    Goal status: Ongoing    13. Pt. Will independently perform bilateral/bimanual hand tasks needed to fold towels/linens/laundry Baseline: 03/18/24: Continue goal 01/08/24: Continue, carry goal over from recertification update last session. 12/27/23: Pt. Has not been able to work on the this  2/2 multiple cancellations this recertification 2/2 illness, familiy health issues,and transportation. 10/23/23: Pt. Continues to have difficulty folding laundry, and linens. 08/23/23: Pt. Continues to have difficulty using bilateral/bimanual hand tasks needed to fold towels/linens/laundry. 08/07/23: Continue 05/31/23: Continue 05/10/23:  Pt. Has difficulty folding towels/lines/laundry using bilateral hands Goal status:Ongoing  14. Pt. Will independently, and efficiently reach his right arm over the side of the chair to pet his dog.    Baseline: 08/23/2023: Pt. Is now able to reach over the side of the armrest of his recliner chair to pet his dog.  08/07/23: Continue 05/31/2023: Continue 05/10/23: Pt. is unable to efficiently reach his right UE over the side of his recliner chair to pet his dog.    Goal status:  Achieved    15. Pt. Will independently, and efficiently reach out in front of him to efficiently place items onto his table while sitting in his recliner.     Baseline: 03/28/24:  Pt. is able to reach out to efficiently reach out in front of him to  place items on the table with the left hand, continue to work towards efficiency, as well as using the RUE. 01/08/24: Continue, carry goal over from recertification update last session. 12/27/2023: Pt. is able to reach out to efficiently reach out in front of him to place items on the table 90% of the time with the LUE, 10% of the time with the RUE 2/2  increased flexor tone. 10/23/23: Pt. Is improving with functional reaching with the left UE out in front of him to place items onto the table. Pt. has difficulty reaching out with the right hand to  place items on the table. 08/23/2023: Pt. has difficulty consistently reaching out in front of him to place items on the on a tabletop surface with the right hand. 08/07/23: Continue 2/26/225: Continue 05/10/23: Pt. has difficulty reaching out in front of him far enough to efficiently place items on the table while sitting in a recliner chair.    Goal status: Continue/Ongoing     16. Pt. Will improve bilateral hand supination by 10 degrees to be able actively turn items in his hand to look at them independently and efficiently during ADLs/IADLs,    Baseline: 03/28/24:  Right: Supination is limited by increased tone, and tightness; Left: 26(38) 01/08/24: Continue, carry goal over from recertification update last session. 12/27/23: Right: Unable 2/2 increased tightness  Left: supination 20(38) 10/23/23: Right: unable, L: supination 30(38)    Goal status: Continue/Ongoing      17. Pt. Will independently, and efficiently transition between picking up finger foods for dipping, and  picking up utensils , then placing them down on the tabletop surface through 75% of the meal with minimal cues.    Baseline: 03/18/24: Continue,  carry goal over from recertification update last session. 12/27/23: Pt. is able to consistently transition between picking up finger foods for dipping, and utensils consistently with the left hand with 100% accuracy, and continues to work on  accuracy with the right hand 2/2 limitations from flexor tone, and tightness prior to Botox.  01/08/24: Continue, carry goal over from recertification update last session. 12/27/23: Pt. is able to consistently transition between picking up finger foods for dipping, and utensils consistently with the left hand with 100% accuracy time, and the right hand with 5% accuracy. 10/23/23: Pt. requires heavy cues, and assist to grasp finger foods, and utensils, and transition between the two.    Goal status: Continue/Ongoing      18. Pt. Will independently transfer small ADL items from one hand to the other efficiently.    Baseline: 03/18/24:  Pt. Requires increased time to transfer items between his hands. Pt. Is able to transfer 1.5 discs between his hands with 60% accuracy    Goal status: New      19. Pt will independently perform controlled hand to face patterns using bilateral hands symmetrically, and efficiently in preparation for being able to intiate donning, and doffing glasses safely.    Baseline: 03/18/24: Pt. Has difficulty using bilateral hand symmetrically, and efficiently for controlled hand to face patterns of movement.    Goal status: New    ASSESSMENT:  CLINICAL IMPRESSION:  Pt. has made progress over this progress reporting and recertification period. Pt. has recently received Botox injections after having to have waiting over a half of year. Although patient continues to present with bilateral flexor tone, and tightness, he has progressed with bilateral UE ROM. Pt. is now engaging his hands more during tasks at home. Pt. is progressing with  grasping utensils, and finger foods. Pt. continues to work on repositioning utensils within his hands. Pt. is  improving with donning/doffing a T-shirt tank. New goals were added to the POC for transferring items from one hand to the other, and using his hands bilaterally in preparation for working towards initiating donning/doffing glasses safely. Pt.'s caregivers continue to be very supportive to continue working towards improving BUE functioning, ROM, strength, motor control, [facility] skills in order to maximize independence with daily ADLs, and IADL functioning. Plan to continue with the same goals into the next recertification period.   PERFORMANCE DEFICITS in functional skills including ADLs, IADLs, coordination, dexterity, proprioception, ROM, strength, pain, FMC, GMC, decreased knowledge of use of DME, and UE functional use, cognitive skills including safety awareness, and psychosocial skills including coping strategies, environmental adaptation, habits, and routines and behaviors.   IMPAIRMENTS are limiting patient from ADLs, IADLs, leisure, and social participation.   COMORBIDITIES may have co-morbidities  that affects occupational performance. Patient will benefit from skilled OT to address above impairments and improve overall function.  MODIFICATION OR ASSISTANCE TO COMPLETE EVALUATION: Maximum or significant modification of tasks or assist is necessary to complete an evaluation.  OT OCCUPATIONAL PROFILE AND HISTORY: Comprehensive assessment: Review of records and extensive additional review of physical, cognitive, psychosocial history related to current functional performance.  CLINICAL DECISION MAKING: High - multiple treatment options, significant modification of task necessary  REHAB POTENTIAL: Fair    EVALUATION COMPLEXITY: High    PLAN: OT FREQUENCY: 2 x's a week  OT DURATION:12 weeks  PLANNED INTERVENTIONS: self care/ADL training, therapeutic exercise, therapeutic activity, neuromuscular re-education, manual therapy, passive range of motion, patient/family education, and cognitive  remediation/compensation RECOMMENDED OTHER SERVICES: PT  CONSULTED AND AGREED WITH PLAN OF CARE: Patient and family member/caregiver  PLAN FOR NEXT SESSION: Review HEP   Richardson Otter, MS, OTR/L  03/18/24

## 2024-03-20 ENCOUNTER — Ambulatory Visit: Admitting: Occupational Therapy

## 2024-03-20 ENCOUNTER — Ambulatory Visit

## 2024-03-22 DIAGNOSIS — G8250 Quadriplegia, unspecified: Secondary | ICD-10-CM | POA: Diagnosis not present

## 2024-03-25 ENCOUNTER — Ambulatory Visit

## 2024-03-25 ENCOUNTER — Ambulatory Visit: Admitting: Occupational Therapy

## 2024-03-27 ENCOUNTER — Ambulatory Visit

## 2024-03-27 ENCOUNTER — Ambulatory Visit: Admitting: Occupational Therapy

## 2024-04-01 ENCOUNTER — Ambulatory Visit

## 2024-04-01 ENCOUNTER — Ambulatory Visit: Admitting: Occupational Therapy

## 2024-04-01 DIAGNOSIS — R262 Difficulty in walking, not elsewhere classified: Secondary | ICD-10-CM

## 2024-04-01 DIAGNOSIS — R269 Unspecified abnormalities of gait and mobility: Secondary | ICD-10-CM | POA: Diagnosis not present

## 2024-04-01 DIAGNOSIS — R2689 Other abnormalities of gait and mobility: Secondary | ICD-10-CM | POA: Diagnosis not present

## 2024-04-01 DIAGNOSIS — R278 Other lack of coordination: Secondary | ICD-10-CM

## 2024-04-01 DIAGNOSIS — M6281 Muscle weakness (generalized): Secondary | ICD-10-CM

## 2024-04-01 DIAGNOSIS — R2681 Unsteadiness on feet: Secondary | ICD-10-CM | POA: Diagnosis not present

## 2024-04-01 DIAGNOSIS — I6930 Unspecified sequelae of cerebral infarction: Secondary | ICD-10-CM | POA: Diagnosis not present

## 2024-04-01 DIAGNOSIS — H543 Unqualified visual loss, both eyes: Secondary | ICD-10-CM | POA: Diagnosis not present

## 2024-04-01 NOTE — Therapy (Unsigned)
 " OUTPATIENT PHYSICAL THERAPY NEURO TREATMENT/RECERT  Patient Name: Paul Hall MRN: [id number] DOB:[birth year], 28 y.o., male Today's Date: 04/02/2024   PCP:  Paul Sous, PA-C  REFERRING PROVIDER: Morene Sous, PA-C  END OF SESSION:  PT End of Session - 04/02/24 0816     Visit Number 187    Number of Visits 211   date corrected from most recent recert   Date for Recertification  06/25/24    Authorization Type [employer]/ [place] Medicaid    Progress Note Due on Visit 190    PT Start Time 1621    PT Stop Time 1713    PT Time Calculation (min) 52 min    Equipment Utilized During Treatment Gait belt    Activity Tolerance Patient tolerated treatment well    Behavior During Therapy WFL for tasks assessed/performed                Past Medical History:  Diagnosis Date   Diabetes mellitus (HCC)    Hypertension    Stroke (HCC)    Past Surgical History:  Procedure Laterality Date   IVC FILTER PLACEMENT ([facility] HX)     LEG SURGERY     PEG TUBE PLACEMENT     TRACHEOSTOMY     Patient Active Problem List   Diagnosis Date Noted   Sepsis due to vancomycin  resistant Enterococcus species (HCC) 06/06/2019   SIRS (systemic inflammatory response syndrome) (HCC) 06/05/2019   Acute lower UTI 06/05/2019   VRE (vancomycin -resistant Enterococci) infection 06/05/2019   Anemia 06/05/2019   Skin ulcer of sacrum with necrosis of muscle (HCC)    Urinary retention    Type 2 diabetes mellitus without complication, with long-term current use of insulin  (HCC)    Tachycardia    Lower extremity edema    Acute metabolic encephalopathy    Obstructive sleep apnea    Morbid obesity with BMI of 60.0-69.9, adult (HCC)    Goals of care, counseling/discussion    Palliative care encounter    Sepsis (HCC) 04/27/2019   H/O insulin  dependent diabetes mellitus 04/27/2019   History of CVA with residual deficit 04/27/2019   Seizure disorder (HCC) 04/27/2019   Decubitus ulcer of sacral  region, stage 4 (HCC) 04/27/2019    ONSET DATE: October 2020  REFERRING DIAG: Cerebral infarction, unspecified   THERAPY DIAG:  Muscle weakness (generalized) - Plan: PT plan of care cert/re-cert  Difficulty in walking, not elsewhere classified - Plan: PT plan of care cert/re-cert  Other lack of coordination - Plan: PT plan of care cert/re-cert  History of CVA with residual deficit - Plan: PT plan of care cert/re-cert  Unsteadiness on feet - Plan: PT plan of care cert/re-cert  Other abnormalities of gait and mobility - Plan: PT plan of care cert/re-cert  Abnormality of gait and mobility - Plan: PT plan of care cert/re-cert  Rationale for Evaluation and Treatment: Rehabilitation  SUBJECTIVE:  SUBJECTIVE STATEMENT:  Patient reports had a good Holiday. States been continuing to work on bridging and all exercises. Mother reports one of her goals for Paul Hall is to roll  better in bed to assist with ADLs particularly toward right side better.   Pt accompanied by: family member- Mother  PERTINENT HISTORY: Paul Hall is a 28 y/o male who presents with severe weakness, quadriparesis, altered sensorium, and visual impairment s/p critical illness and prolonged hospitalization. Pt hospitalized in October 2020 with ARDS 2/2 COVID19 infection. Pt sustained a complex and lengthy hospitalization which included tracheostomy, prolonged sedation, ECMO. In this period pt sustained CVA and SDH. Pt has now been liberated from tracheostomy and G-tube. Pt has since been hospitalized for wound infection and UTI. Pt lives with parents at home, has hospital bed and left chair, hoyer lift transfers, and power WC for mobility needs. Pt needs heavy physical assistance with ADL 2/2 BUE contractures and motor dysfunction   PAIN:   Are you having pain? No  PRECAUTIONS: Fall  RED FLAGS: None   WEIGHT BEARING RESTRICTIONS: No  FALLS: Has patient fallen in last 6 months? No  LIVING ENVIRONMENT: Lives with: lives with their family Lives in: House/apartment Stairs:  Has following equipment at home: Wheelchair (power) and hospital bed, hoyer lift, Sit to stand lift  PLOF: Independent  PATIENT GOALS: I want to walk  OBJECTIVE:  Note: Objective measures were completed at Evaluation unless otherwise noted.  DIAGNOSTIC FINDINGS: CLINICAL DATA:  Altered mental status.  History of COVID and stroke.   EXAM: CT HEAD WITHOUT CONTRAST   TECHNIQUE: Contiguous axial images were obtained from the base of the skull through the vertex without intravenous contrast.   COMPARISON:  CT head 11/11/2012   FINDINGS: Brain: Left cerebral convexity cortical hypodensity with associated cortical calcification extending from the frontal to the parietal lobe. Extensive area of volume loss and low-density compatible with chronic infarction. This is noted on prior reports from [facility]. Probable chronic watershed infarct. Additional small areas of hypodensity in the right frontal white matter consistent with chronic ischemia.   Negative for acute infarct. No acute hemorrhage or mass. Ventricle size normal without midline shift.   Vascular: Normal arterial flow voids.   Skull: Negative   Sinuses/Orbits: Mucosal edema paranasal sinuses with air-fluid level left sphenoid sinus. Negative orbit   Other: None   IMPRESSION: Extensive watershed type infarct over the left cerebral convexity with cortical calcification. This appears chronic. No associated hemorrhage, hydrocephalus, or midline shift.   These results were called by telephone at the time of interpretation on 05/09/2019 at 3:12 pm to provider Paul Hall , who verbally acknowledged these results.     Electronically Signed   By: Paul Hall M.D.   On:  05/09/2019 15:13  COGNITION: Overall cognitive status: Impaired   SENSATION: Light touch: WFL  COORDINATION: Delayed with BLE  EDEMA:  None observed  MUSCLE TONE: RLE: Moderate  POSTURE: rounded shoulders and forward head  LOWER EXTREMITY ROM:     Active  Right Eval Left Eval  Hip flexion    Hip extension    Hip abduction    Hip adduction    Hip internal rotation    Hip external rotation    Knee flexion    Knee extension    Ankle dorsiflexion    Ankle plantarflexion    Ankle inversion    Ankle eversion     (Blank rows = not tested)  LOWER EXTREMITY MMT:    MMT  Right Eval Left Eval  Hip flexion    Hip extension    Hip abduction    Hip adduction    Hip internal rotation    Hip external rotation    Knee flexion    Knee extension    Ankle dorsiflexion    Ankle plantarflexion    Ankle inversion    Ankle eversion    (Blank rows = not tested)  BED MOBILITY:  Findings: Rolling to Right Max A Rolling to Left Max A  TRANSFERS: Sit to stand: Max A x 2 - just able to clear gluteals off mat  Assistive device utilized: HHA x2 to stand- dependent on hoyer for actual transfers     Stand to sit: Max A  Assistive device utilized: HHA x 2        GAIT: Findings: Non-Ambulatory  FUNCTIONAL TESTS:  None appropriate at this time  PATIENT SURVEYS:                                                                                                                                TREATMENT DATE: 04/01/2024   Physical therapy treatment session today consisted of completing assessment of goals and administration of testing as demonstrated and documented in flow sheet, treatment, and goals section of this note. Addition treatments may be found below.     TA:   - Review and instruction in some components of bed mobility including:   Rolling to left (mod Assist) yet patient able to cross his RLE over his LLE on his own consistently x 10 reps    Rolling to Right (mas  assist) - patient able to cross his LLE over his right but unable to roll without increased physical assist x 10 reps   Lower trunk rotation - min/mod assist to left and mod assist to right x 20 reps    Upper trunk rotation- max assist to right and mod assist to left x 20 reps  TE:   Bridging with physical support at feet 2 x 10 (max effort on both sets) - minimal gluteal clearance   Heel slides + hip flex and keeping knee in line (performing with only CGA) 2 x 10    Hip abd/add (hooklye) 2 x 10 reps BLE   Manual resistive leg press x 12 reps each LE   PATIENT EDUCATION: Education details: Exercise technique; Plan for recert today Person educated: Patient and Parent Education method: Explanation Education comprehension: verbalized understanding  HOME EXERCISE PROGRAM: Access Code: [email or web address] URL: [email or web address] Date: 03/23/2022 Prepared by: Reyes London   Exercises - Supine Bridge  - 3 x weekly - 3 sets - 10 reps - 2 hold - Supine Gluteal Sets  - 3 x weekly - 3 sets - 10 reps - 5 sec hold - Supine Quad Set  - 1 x daily - 3 x weekly - 3 sets - 10 reps - 5 hold - Seated Long Arc Quad  -  1 x daily - 7 x weekly - 3 sets - 10 reps - Seated Hip Adduction Squeeze with Ball  - 1 x daily - 3 x weekly - 3 sets - 10 reps - 5 hold - Seated Hip Abduction  - 1 x daily - 3 x weekly - 3 sets - 10 reps - 2 hold  GOALS: Goals reviewed with patient? Yes  SHORT TERM GOALS: Target date: 02/05/2024  Patient will be independent with updated HEP for improved overall LE strength in home Baseline: HEP continues to be updated as appropriate. 04/02/2024 - HEP continues to evolve as patient makes functional strength gains. He is compliant mostly with current HEP unless sick Goal status: PROGRESSING  LONG TERM GOALS: Target date: 06/25/2024 -Goal were updated/revised secondary to length of chart on last cert.   Patient will perform sit to stand transfer with appropriate AD and max  assist of 2 people with 75% consistency to prepare for pregait activities.  Baseline: 09/04/2023- Patient performed several Sit to stand requiring max A+2  with improving posture- able to ext R knee better - near terminal knee and able to clear bottom off mat- still not full but able to contract glutes for standing as erect as possible. 04/01/2024- Did not attempt this visit secondary to assessing other goals but goal still appropriate and will be a focus during new cert. Goal status: ONGOING  2.  Patient will tolerate 2 minutes or more of standing in sit to stand lift or with max assist +2 in order to indicate improved lower extremity weightbearing tolerance for progression to standing in parallel bars  Baseline:  09/04/2023- Patient performed 3 trials of standing ranging from 17 -47 sec - improving right knee terminal knee ext and more activation upon command with gluteals- able to clear mat table. Continued to require Max A+2 and blocking knees with forearm/trunk support.  04/01/2024- Did not attempt this visit secondary to assessing other goals but goal still appropriate and will be a focus during new cert. Goal status: ONGOING  3.  Patient will perform rolling left to right with Mod assist for improved independence with bed mobility to assist with dressing and positioning in bed.  Baseline: Dependent on caregiver to roll to left; 12/27/2023= Mod to max assist each direction (difficult due to RUE tone). 04/01/2024- Patient able to roll to left with Mod assist today and still max to right- reviewed all techniques for Mother to practice with patient.  Goal status: Ongoing  4.  Patient will perform active bridging -able to clear bottom   Off mat for 10 reps with PT/caregiver holding his feet for improved trunk ext strength with standing and to assist with mobility/positioning.  Baseline: Unable to perform 10 reps with consistent clearing of bottom; 12/27/2023 = Patient able to minimally clear his glues off  mat table - but successfully 10 reps today.  Goal status: MET  5. Patient will perform active bridging -able to clear bottom   Off mat for 3 sets of 10 reps with PT/caregiver holding his feet for improved trunk ext strength with standing and to assist with mobility/positioning.  Baseline: 12/27/2023- Patient able to complete 10 reps with max effort; 04/01/2024- Patient was able to perform 2 sets of 10 reps with minimal gluteal clearing mat.   Goal status: PROGRESSING     ASSESSMENT:  CLINICAL IMPRESSION: Patient presents in good spirits for re-certification visit. He presents with some functional progress as seen by improved ability to perform supine bridging to  assist with bed activities and hopefully trunk/hip ext for future trials of standing. He struggled to roll to right and reviewed technique with both patient and mother and how to practice components of rolling for trunk dissociation. He has made some overall gains with strength but has limited this cert overall- due to illness and some inconsistent visit so practice standing has been limited but will continue to be a primary focus leading into new certification.  Pt will continue to benefit from skilled physical therapy intervention to address impairments, improve QOL, and attain therapy goals.   OBJECTIVE IMPAIRMENTS: Abnormal gait, cardiopulmonary status limiting activity, decreased activity tolerance, decreased balance, decreased cognition, decreased coordination, decreased endurance, decreased knowledge of condition, decreased knowledge of use of DME, decreased mobility, difficulty walking, decreased ROM, decreased strength, hypomobility, impaired flexibility, impaired sensation, impaired UE functional use, impaired vision/preception, postural dysfunction, obesity, and pain.   ACTIVITY LIMITATIONS: carrying, lifting, bending, sitting, standing, squatting, stairs, transfers, bed mobility, bathing, toileting, dressing, self feeding, reach  over head, and hygiene/grooming  PARTICIPATION LIMITATIONS: meal prep, cleaning, laundry, medication management, personal finances, interpersonal relationship, driving, shopping, community activity, occupation, yard work, and church  PERSONAL FACTORS: Time since onset of injury/illness/exacerbation and 3+ comorbidities: DM, CVA, Seizures are also affecting patient's functional outcome.   REHAB POTENTIAL: Good  CLINICAL DECISION MAKING: Evolving/moderate complexity  EVALUATION COMPLEXITY: Moderate  PLAN:  PT FREQUENCY: 1-2x/week  PT DURATION: 12 weeks  PLANNED INTERVENTIONS: 97110-Therapeutic exercises, 97530- Therapeutic activity, 97112- Neuromuscular re-education, 97535- Self Care, [id number]- Manual therapy, and Patient/Family education  PLAN FOR NEXT SESSION:  Continue with Standing frame Resume standing with +2 as able  Nustep for UE/LE strengthening Mat table dynamic sitting activities.    Reyes LOISE London, PT 04/02/2024, 8:41 AM        "

## 2024-04-01 NOTE — Therapy (Signed)
 . Occupational Therapy Neuro Treatment Note    Patient Name: Paul Hall MRN: [id number] DOB:Aug 23, 1995, 28 y.o., male Today's Date: 04/01/2024  PCP: Dr. Bertell REFERRING PROVIDER: Dr. Bertell   .  OT End of Session - 04/01/24 2222     Visit Number 121    Number of Visits 180    Date for Recertification  06/10/24    OT Start Time 1550    OT Stop Time 1615    OT Time Calculation (min) 25 min    Equipment Utilized During Treatment tilt in space power wc    Activity Tolerance Patient tolerated treatment well    Behavior During Therapy WFL for tasks assessed/performed            Past Medical History:  Diagnosis Date   Diabetes mellitus (HCC)    Hypertension    Stroke [facility])    Past Surgical History:  Procedure Laterality Date   IVC FILTER PLACEMENT ([facility] HX)     LEG SURGERY     PEG TUBE PLACEMENT     TRACHEOSTOMY     Patient Active Problem List   Diagnosis Date Noted   Sepsis due to vancomycin  resistant Enterococcus species (HCC) 06/06/2019   SIRS (systemic inflammatory response syndrome) (HCC) 06/05/2019   Acute lower UTI 06/05/2019   VRE (vancomycin -resistant Enterococci) infection 06/05/2019   Anemia 06/05/2019   Skin ulcer of sacrum with necrosis of muscle (HCC)    Urinary retention    Type 2 diabetes mellitus without complication, with long-term current use of insulin  (HCC)    Tachycardia    Lower extremity edema    Acute metabolic encephalopathy    Obstructive sleep apnea    Morbid obesity with BMI of 60.0-69.9, adult (HCC)    Goals of care, counseling/discussion    Palliative care encounter    Sepsis (HCC) 04/27/2019   H/O insulin  dependent diabetes mellitus 04/27/2019   History of CVA with residual deficit 04/27/2019   Seizure disorder (HCC) 04/27/2019   Decubitus ulcer of sacral region, stage 4 (HCC) 04/27/2019   ONSET DATE: 01/2019  REFERRING DIAG: CVA/COVID-19  THERAPY DIAG:  Muscle weakness (generalized)  Other lack of  coordination  Rationale for Evaluation and Treatment Rehabilitation  SUBJECTIVE:    SUBJECTIVE STATEMENT:   Pt.  reports  having had a nice birthday this weekend.   Pt accompanied by: self and family member  PERTINENT HISTORY:  Pt. is a 28 y.o. male who was diagnosed with COVID-19, and CVA with resultant quadriplegia. Pt. Was then hospitalized with VRE UTI. PMHx includes: urinary retention, seizure disorder, obstructive sleep disorder, DM Type II, Morbid obesity.   PRECAUTIONS: None  WEIGHT BEARING RESTRICTIONS No  PAIN:   03/06/24: No pain  FALLS: Has patient fallen in last 6 months? No  LIVING ENVIRONMENT: Lives with: lives with their family Lives in: House/apartment Stairs: No Level Entry Has following equipment at home: Wheelchair (power) and hoyer lift, sit to stand lift  PLOF: Independent  PATIENT GOALS: To be able to engage in more daily care tasks.  OBJECTIVE:   HAND DOMINANCE: Right  ADLs:  Transfers/ambulation related to ADLs: Eating: Pt. reports being able to hold standard utensils, and is starting to engage more in self-feeding tasks, hand to mouth patterns. Pt. Reports that he does as much as he can with the task, and family assists  with the remainder of the task. Grooming: Pt. is able to initiate holding an electric toothbrush, and brush his teeth. Family assists LB Dressing:  Total Assist UE dressing: Pt. is now able to reach up to actively assist with grasping , and pulling his gown down. Toileting:  Total Assist Bathing: MaxA UB, Total assist LB Tub Shower transfers: N/A Equipment: See above  IADLs: Shopping: Relies on family to assist Light housekeeping: Total Assist Meal Prep: Total Assist Community mobility:   Medication management:  Total Assist  Financial management: N/A Handwriting: Not legible: Pt. Is able to hold a pen with the left hand, and initiate marking the page. Pt.'s eye glasses were not available  MOBILITY STATUS:  Power  w/c  POSTURE COMMENTS:  Pt. Requires position changes in his power w/c  ACTIVITY TOLERANCE: Activity tolerance:  Fair  FUNCTIONAL OUTCOME MEASURES: FOTO: 40 TR score: 45  05/25/2022:   FOTO: 50 TR score: 45  07/20/22: FOTO 55  03/08/23: 51  UPPER EXTREMITY ROM     Active ROM Left eval Left  03/21/2022 Left 05/25/2022 L  07/20/22 Left 07/25/2022 Left  08/15/2022 Left  09/26/2022 Left  11/16/2022 Left 12/28/2022 Left 03/13/23 Left 05/10/23 Left 10/23/23 Left 12/27/23 Left 03/18/24  Shoulder flexion 119 123 125 130  136 138 130 142 144 144 148 148 150  Shoulder abduction 110 115 115 115  118 121 125 142 140 140 140 140 145  Shoulder adduction                Shoulder extension                Shoulder internal rotation                Shoulder external rotation                Elbow flexion 135(135) 145 145 145  145    145 145  145 146  Elbow extension -27(-18) -21(-20) -20(-16)  -25(-10) -23(-10) -21(-10) -20(-18) -20(-18) -20(-17) -17(-16) -12(-8) -12(-6) -8(-4)  Wrist flexion                Wrist extension 10(50) 19(50) 28(50)  30(50) 35(55) 36(55) 36(60) 40(60) 45(60) 45(60) 42(54) 36(54) 48(58)  Wrist ulnar deviation                Wrist radial deviation                Wrist pronation            60(74) 84(84) 90(90)  Wrist supination   Limited by flexor tone       10(15)  30(38) 20(38) 26(38)  (Blank rows = not tested)    Active ROM Right eval Right 03/21/2022 Right 05/25/2022 R  07/20/22 Right  07/25/22 Right 08/15/2022 Right 09/26/2022 Right 11/16/2022 Right 12/28/2022 Right 03/13/23 Right 05/10/23 Right 10/23/23 Right  9/24 /25 Right 03/18/24  Shoulder flexion 106 scaption 105  Scaption 62 flexion 110 scaption 100  105 105 105 112 Scaption 123 Scaption 122  Scaption 142 Scaption 142 144  Shoulder abduction 114 90 97 100  105 117 120 124 124 122 144 146 140  Shoulder adduction                Shoulder extension                Shoulder internal rotation                 Shoulder external rotation                Elbow flexion 120(130) 145 145 140  144  140 142 148 148 145  145 148  Elbow extension -45(-35) -22(-35) -28(-22)  -32(-21) -32(-28) -28(-26) -28(-28) -27(-26) -27(-40) -38(-35) -34(-26) -34(-26) -36(-25)  Wrist flexion                Wrist extension -30(10) -25(10) -10(30)  -10(20) -10(25) -20(40) -30(10) -22(34) -22(35) -32(22) -30(-8) -38(-6) -36  Wrist ulnar deviation                Wrist radial deviation                Wrist pronation                Wrist supination   Limited by flexor tone                  12/28/2022: Thumb radial abduction: Right: 12(46), Left: 40(54)  Digits: Bilateral 2nd through 5th digit: MP Hyperextension with PIP, an DIP flexion    10/23/23:  Bilateral 2nd through 5th digit MP hyperextension with PIP, and DIP flexion: Pt. has been intermittently able to initate active diigt PIP, and DIP extension following Botox during this past recertification/progress reporting period.     UPPER EXTREMITY MMT:     Left eval Left 03/21/2022 Left  05/25/2022 L 07/20/22 Left  08/15/2022 Left 09/26/2022 Left 11/16/2022 Left 12/28/2022 Left  03/13/2023 Left  05/10/23 Left 10/23/23 Left 12/27/23 Left 03/18/24  Shoulder flexion 3-/5 3/5 3+/5 4+/5 4+/5 4+/5 4+/5 4+/5 4+/5 4+/5 4+/5 4+/5 4+/5  Shoulder abduction 3-/5 3-/5 4-/5 4+/5 4+/5 4+5 4+/5 4+/5 4+/5 4+/5 4+/5 4+/5 4+/5  Shoulder adduction               Shoulder extension               Shoulder internal rotation               Shoulder external rotation               Elbow flexion 3/5 3+/5 4-/5 4+/5 5/5 5/5 5/5 5/5 5/5 5/5 5/5 5/5 5/5  Elbow extension 2-/5 2/5  4+/5 4+/5 4+/5  4/5 4/5 4/5 through available range 4+/5 through available range 4+/5 4+/5  Wrist flexion               Wrist extension 2/5 2+/5 3-/5  3-/5 3-/5 3-/5 3/5 3/5 3/5 3+/5 3-/5 3-/5  Wrist ulnar deviation               Wrist radial deviation               Wrist pronation               Wrist supination                (Blank rows = not tested)  Right eval Right 03/21/2022 Right  05/25/2022 R 07/20/22 Right 08/15/2022 Right 09/26/2022 Right 11/16/2022 Right 12/28/2022 Right 03/13/2023 Right 05/10/23 Right 10/23/23 Right 12/27/23 Right 03/18/24  Shoulder flexion 3-/5 scaption 3-/5 3-/5 3+/5 3+/5 3+/5 3/5 3/5 3/5 3+/5 4-/5 4/5 4/5  Shoulder abduction 3-/5 3-/5 3-/5 4/5 4/5 4/5 4/5 4/5 4/5 4/-5 4-/5 4/5 4/5  Shoulder adduction               Shoulder extension               Shoulder internal rotation               Shoulder external rotation  Elbow flexion 3/5 3/5 3+/5 4/5 4+/5 4+/5 4+/5 4+/5 4+/5 4+/5 4+/5 5/5 5/5  Elbow extension 2-/5 2/5 3-/5 4+/5 4+/5 4+/5 4+/5 4-/5 4-/5 4-/5 4-/5 through available range 3-/5 3-/5  Wrist flexion               Wrist extension 2-/5 2-/5 2/5 2/5 2/5 2/5 2/5 2/5 2/5 2/5 2/5 2-/5 2-/5  Wrist ulnar deviation               Wrist radial deviation               Wrist pronation               Wrist supination                 HAND FUNCTION:  Grip strength: Right: 0 lbs; Left: 0 lbs and Lateral pinch: Right: 5 lbs, Left: 2 lbs  03/21/2022: Lateral pinch: Right: 3.5 lbs, Left: 2 lbs  07/20/22: Grip strength: Right: 3 lbs; Left: 4 lbs and Lateral pinch: Right: 5 lbs, Left: 3 lbs  08/15/2022:  Grip strength: Right: 0 lbs; Left: 5 lbs and Lateral pinch: Right: 2 lbs, Left: 4 lbs  09/26/2022  Grip strength: Right: 0 lbs; Left: 8 lbs and Lateral pinch: Right: 2 lbs, Left: 5 lbs  11/16/2022  Grip strength: Right: 0 lbs; Left: 8 lbs  9/25/20204  Grip strength: Right: 0 lbs; Left: 8 lbs  Grip strength: Right: 0 lbs; Left: 8 lbs   03/08/23:  Grip strength: Right: 0 lbs without PROM, 5 lbs after PROM, Left 8 lbs  05/10/23:  Grip strength: TBD  Bilateral digit PIP/DIP flexion contractures with MP hyperextension with attempts for AROM. Pt. is able to tolerate AROM to the bilateral digits at the initial evaluation however, has a history of pain in the  digits.  COORDINATION: Eval: Pt. is unable to grasp 9-hole test pegs. Pt. is able to initiate grasping larger pegs, and is able to hold a pen in the left hand.  07/20/22: 2 min 36 seconds to remove 9 pegs from 9 hole peg test - cues to locate pegs 2/2 low vision. Pt. is able to initiate grasping larger pegs on R hand and is able to hold a pen in the left hand.  08/15/2022:    Vision, and sensation limiting accuracy of 9 hole peg test results. Pt. Was able to grasp and remove vertical pegs intermittently with cues.    05/10/23: TBD  03/18/24:  Box and Blocks:  Right:  0 blocks completed in 1 min., Left: 7 blocks completed in 1 min.  SENSATION: Light touch: Impaired   EDEMA:  N/A  MUSCLE TONE: BUE flexor Spasticity  COGNITION Overall cognitive status: Continue to assess in functional context  VISION:   Subjective report: Pt. was not wearing glasses at the time of the initial eval.  Baseline vision: Vision is very limited. Wears glasses all the time Visual history: History of impaired vision following CVA. Pt. Has received treatment through the [facility] low vision rehabilitation program.   VISION ASSESSMENT: Impaired To be further assessed in functional context  PERCEPTION: Impaired   PRAXIS: Impaired: motor planning  OBSERVATIONS:  Pt reports being on Tramadol   TODAY'S TREATMENT : 04/01/24  Moist heat modality to the RUE, and bilateral hands in conjunction with ROM   Therapeutic Ex.:    -BUE tolerated PROM perrformed with emphasis placed on slow prolonged gentle stretching of the Digit MCP/PIP/DIPs of bilateral hands in conjunction with  moist heat modality 2/2 flexor tone, tightness, and stiffness.  Therapeutic Activities:   -Facilitated bilateral  hand coordination skills needed for folding linens at the tabletop surface with emphasis placed on incorporating the RUE while   using the bilateral hands in midline. -Facilitated engagement of the RUE in removing his  glasses with bilateral hands.  PATIENT EDUCATION: Education details: ROM, BUE tone  Person educated: Patient and Parent Education method: Explanation Education comprehension: verbalized understanding  HOME EXERCISE PROGRAM:    Incorporating  the right hand during self-feeding tasks.   GOALS: Goals reviewed with patient? Yes  SHORT TERM GOALS: Target date: 04/29/2024     To assess splint fit, and make appropriate adjustments to promote good skin integrity through the palmar surface of the bilateral hands.  Baseline: 05/25/22: Goal currently met, however ongoing as needs to assess splint fit arise. 03/23/2022: Pt. is wearing splints a couple of hours at night bilateral resting hand splints. 03/21/2022: Pt. is wearing splints a couple of hours at night bilateral resting hand splints. Goal status: Deferred   LONG TERM GOALS: Target date: 06/10/2024     FOTO score will Improve by 2 points for Pt. perceived improvement with the assessment specific ADL/IADL tasks.  Baseline: 03/08/23: 51; 12/28/2022: FOTO score: 56; 11/16/2022: 52 09/27/2022: 51 07/20/2022: FOTO 55 05/25/2022: FOTO score: 50,  TR score: 45 Eval: FOTO score: 40, TR score: 45 Goal status: Achieved   2.   Pt. will independently perform oral care for 100% of the task after complete set-up. Baseline: 05/10/23: Pt. Performs 90% of the task. Pt.'s mother assists with 10% of the task for thorough cleaning the hard to reach places way in the back of the oral cavity.03/08/23: not as much assist required proximally, but to adjust positioning of toothbrush; 02/13/23: Pt. Continues to require assist proximally 01/09/2023: Continue 12/28/2022:  Pt. Continues to complete 90% of the task with complete set-up, with visual cues, and occassional assist of the right elbow. 11/16/2022: Pt. Completes 90% of the task with complete set-up. 09/26/2022: Completes 90% of the task, proximal assist at times required at the elbow.  08/15/2022: Completes 90% of the task,  proximal assist at times required at the elbow. 07/20/22: completes 90% of task, limited by shoulder flexion. 05/25/2022:  Pt. Is able to initiate and perform oral care for approximately 90% of the task. Complete set-up required. Assi needed only for the very back teeth. 03/23/2022: Pt. Is able to initiate and perform oral care for approximately 75% of the task. Pt. Requires assist at proximally at the elbow for through oral care. 03/21/2022: Pt. Is able to initiate and perform oral care for approximately 75% of the task. Pt. Requires assist at proximally at the elbow for through oral care. Eval: Pt. is able to initiate using an electric toothbrush. Pt. requires assist for set-up, and assist for thoroughness, and as he Pt. fatigues. Goal status:  Partially met, Goal discontinued  3.  Pt. Will be modified independence with self-feeding for 100% using a swivel spoon, and standard fork after complete set-up Baseline: 05/10/2023: Pt. feeds self 95-100% of the meal. with occasional assist to reposition the utensil. 03/08/23: indep with cup with a handle and lid, assist to reposition eating utensils in hand;  02/13/23:  Pt. Is now able to consistently, and efficiently use a swivel spoon for eating cereal with the left hand.01/09/2023: Continue 12/28/2022: Pt. Is now feeding himself 90% of the time using a swivel spoon with the left hand, and 95% of  the time with the fork. Pt. Continues to have difficulty with cutting food. 11/16/2022: 100% with finger foods, Pt. Is initiating with a swivel spoon, and universal cuff for fork use, however requires assist for consistency, and accuracy.  09/26/2022: 90% using the spoon, assist at time with the forearm motion, and grip on the utensils. 100% for finger foods.  08/15/2022:  90% using the spoon, assit at time with the forearm motion, and grip on the utensils. 100% for finger foods-independent with set-up- unsupervised.  07/20/22: self-feeds cereal using spoon 90% of task. 05/25/2022:  Pt. Is able to use a spoon to scoop cereal when feeding himself cereal 85% of the time. Pt. Is able to feed himself snack/finger foods 100% of the time. Pt. Continues to work on consistency of  stabilizing a cup/mug when drinking. Pt. Is able to grasp a water  bottle with assist initially, with assist tapering off as he drinks.03/23/2022: Pt. Is able to perform scooping cereal for 75% of the time. Pt. required assist, and support at the left elbow, and Pt. Presents with limited forearm supination when using the spoon, and bringing it towards his mouth. Pt. Is able to use a fork to spear items, and perform the hand to mouth pattern.  03/21/2022: Pt. Is able to perform scooping cereal for 75% of the time. Pt. required assist, and support at the left elbow, and Pt. presents with limited forearm supination when using the spoon, and bringing it towards his mouth. Pt. Is able to use a fork to spear items, and perform the hand to mouth pattern.  Eval: Pt. is able to hold standard standard utensils. Pt. Performs as much of the task as he, can and has assistance for the remainder. Goal status:  Improved, Achieved  4.  Pt. will improve grasp patterns and consistently grasp 1/4 objects for ADL, and IADL tasks.  Baseline: 03/18/24: Pt. is able to grasp thin objects including chips, and crackers, and dics. Pt. Continues to work towards grasping progressively smaller objects. 01/08/24: Continue, carry goal over from recertification update last session. 12/27/23: Pt. is able to present with increased tightness  2/2 being way overdue for Botox injections, which limits consistency with grasping small objects. 10/23/2023: Pt. is able to initiate grasping small objects visual cues, and increased time, however continues to work towards grasping 1/4 objects efficiently. 08/27/23: Pt. is able to grasp flat discs, and sustain a secure hold on them while moving the UE through various planes. 08/07/23: Continue 05/31/2023: Continue 05/10/23:  Pt. is consistently able to grasp 1/2 objects with visual cues. Pt. Requires assist, and increased visual cues to grasp 1/4 objects on an elevated plane.  Pt. Is unable to grasp the flat objects at the tabletop surface. Pt. Is grasping grasping small puppy vitamins.03/08/23: 1/2 objects efficiently; 02/13/2023: 1/2 objects efficiently 01/09/2023: Continue 12/28/2022: Continues to grasp 1/4 pegs with min a + visual cues, consistently grasping 1/2 objects with visual cues 11/16/2022: Continues to grasp 1/4 pegs with min a + visual cues, consistently grasping 1/2 objects with visual cues 09/26/2022: Continues to grasp 1/4 pegs with min a + visual cues, consistently grasping 1/2 objects with visual cues. 08/15/2022: grasps 1/4 pegs with min a + visual cues, consistently grasping 1/2 objects with visual cues. 07/20/22: grasps 1/4 pegs with min a + visual cues, consistently grasping 1/2 objects with visual cues. 05/25/2022: Pt. Is working on improving consistency of grasping 1/2 objects with visual cues.  03/23/2022: Pt. Is able to grasp 1 objects consistently,and continues  to work on the hand patterns needed to grasp 1/2 objects.03/21/2022: Pt. Is able to grasp 1 objects consistently,and continues to work on the hand patterns needed to grasp 1/2 objects. Eval: Pt. is able to grasp 1 objects intermittently using a lateral grasp pattern. Goal status: Ongoing  5.  Pt. will independently write his name legibly with letter sizes under 1. Baseline:  05/10/23: Deferred 03/08/23: Not addressed this period; 02/13/2023: Continue 01/09/2023: Continue. 12/28/2022: Pt. is now using an IPad Pen. 11/16/2022: Pt. Continues to be able to write name with smaller letter size. Pt. Is signing name with a computer styus. Pt. Requires visual cues, and assist 09/26/2022: Pt. Continues to be able to write name with smaller letter size. Pt. Is signing name with a computer styus. Pt. Requires visual cues, and assist. 08/15/2022: Pt. Is  able to write name with smaller letter size. Pt. Is signing name with a computer styus. Pt. Requires visual cues, and assist. 07/20/22: stabilizing assist to write name with 75% legibility with 2 letters. 05/25/2022: Pt. to continue to work towards formulating grasp patterns in preparation for grasping a large width pen. Pt. Requires visual cues. 03/23/2022: Pt. Is able to write his name with modA, however has difficulty with formulating letter sizes less than 2 in size with 50% legibility for the 3 letters of his name.03/21/2022: Pt. Is able to write his name with modA, however has difficulty with formulating letter sizes less than 2 in size with 50% legibility for the 3 letters of his name. Eval: Pt is able to hold a thin marker with his left hand, and formulate a line, and initiate a circular pattern (Pt. without glasses today) Goal status: Deferred  6. Pt. Will reach up to comb/brush his hair with minA.  Baseline: 05/10/23: Pt. is able to use a pick independently with the left hand with occasional assist to the far side of the left his head. 03/08/23: Pt uses pick in L hand to reach 75% of his head; unable to reach R side of head; 02/13/2023:Pt. is able to use a hair pick with the left hand on the right side of his head, top, and back of the head. Pt. Continues to have difficulty reaching further to the right side of his head with the left. 01/09/2023: Continue 12/28/2022: Pt. Is progressing, and is now able to use a hair pick with the left hand on the right side of his head, top, and back of the head. Pt. Continues to have difficulty reaching further to the right side of his head with the left. 11/16/2022: Pt. Is able to use a hair pick for the left side of his head using his left hand. Pt. Presents with difficulty reaching to the right side of his head.09/26/2022: Pt. Is able to use a hair pick for the left side of his head using his left hand. Pt. Presents with difficulty reaching to the right side of his  head. 5/13/202: 75% using a hair pick, assist to the right side of the head. 07/20/22: reaches 75% of head, assist for far R side of head, fatigues quickly. 05/25/2022: Pt. Is able to reach up with the left hand to the left side, top, and back of his head. 03/23/2022: Pt. is now able to more consistently initiate reaching up to his head with his left hand in preparation for haircare12/18/2023: Pt. is now able to more consistently initiate reaching up to his head with his left hand in preparation for haircare. Eval: Pt. is able  to initiate reaching up for hair care with a long handled brush, however is unable to sustain UE's in elevation to perform the task.     Goal status: Achieved  7. Pt. Will independently navigate the w/c through his environment with minA with visual scanning, and hand placement on the controls.  Baseline: 11/16/2022: Deferred 2/2 vision 09/26/2022: Pt. Is now navigating his w/c ouside the home, and around his block. 08/15/2022: Pt. Requires minA to setup hand on controls and MIN cues to navigate the w/c in wide spaces, requires MIN - MOD cues to navigate the w/c through more narrow doorways, and tighter turns - varies based on good vs bad vision days.07/20/22: Pt. Requires minA to setup hand on controls and MIN cues to navigate the w/c in wide spaces, requires MIN - MOD cues to navigate the w/c through more narrow doorways, and tighter turns - varies based on good vs bad vision days. 05/25/2022: Pt. Requires minA  and  cues to navigate the w/c in wide spaces, and requires Mod cues to navigate the w/c through more narrow doorways, and tighter turns. Pt. Requires max cues for scanning through the environment, and moderate cues for hand placement on the controls.     Goal status: Deferred 2/2 vision    8. Pt. Will improve bilateral grip strength to be able to independently grasp, and pull up blankets, and linens.   Baseline: 05/10/23: Deferred 03/08/23: R grip 0-5 lbs, L grip 8 lbs; pt reports he  can consistently pull up blankets and linens independently.  02/13/2023: Continue. Pt. presents with digit PIP, and DIP flexor tightness. 01/09/2023: Continue 12/28/2023: R: 0#, L: 5# Pt. Is now able to more consistently grasp and pull blankets up over him. 11/16/2022: R: 0 L: 8# Pt. Is more consistently grasping his blankets and attempting to pull them up. 09/26/2022: R: 0  L: 8# Pt. is attempting to grasp, and pull blankets up more, using mostly the left hand. 08/15/2022: Pt. has difficulty securely holding, and pulling up blankets, and linens.    Goal status:Deferred  9. Pt. will consistently actively control the releasing of blankets, covers, and linens from his hands once they are in the desired position over him. Baseline: 12/28/2022: Pt. Is consistently actively able to release linens once they are in the desired position over him. 11/16/2022: Pt. continues to improve with actively releasing blankets form his hands. 09/26/2022: Pt. is improving with actively releasing blankets form his hands. 08/15/2022: Pt. has difficulty with controlled releasing of blankets/linens from his grasp.     Goal status: Achieved    10. Pt. Will improve bilateral UE strength by 2 MM grades to assist with ADLs, and IADLs  Baseline: 03/18/24: Right: shoulder flexion: 4/5, abduction: 4/5, elbow flexion: 5/5 , extension: 3-/5 wrist extension: 2-/5; Left: shoulder flexion: 4+/5, abduction: 4+/5, elbow flexion: 5/5 , extension: 4+/5  wrist extension: 3+/5   01/08/24: Continue, carry goal over from recertification update last session. 12/27/23:  Right: shoulder flexion: 4/5, abduction: 4/5, elbow flexion: 5/5 , extension: 3-/5 wrist extension: 2-/5; Left: shoulder flexion: 4+/5, abduction: 4+/5, elbow flexion: 5/5 , extension: 4+/5  wrist extension: 3+/5  08/27/23:  Right: shoulder flexion: 3+/5, abduction: 4-/5, elbow flexion: 4+/5 , extension: 4-/5 wrist extension: 2/5; Left: shoulder flexion: 4+/5, abduction: 4+/5, elbow flexion: 5/5 ,  extension: 4+/5  wrist extension: 3-/5  10/23/2023: Right: shoulder flexion: 4-/5, abduction: 4-/5, elbow flexion: 4+/5 , extension: 4-/5 wrist extension: 2/5; Left: shoulder flexion: 4+/5, abduction: 4+/5, elbow flexion:  5/5 , extension: 4+/5  wrist extension: 3+/5  08/27/23:  Right: shoulder flexion: 3+/5, abduction: 4-/5, elbow flexion: 4+/5 , extension: 4-/5 wrist extension: 2/5; Left: shoulder flexion: 4+/5, abduction: 4+/5, elbow flexion: 5/5 , extension: 4/5  wrist extension: 3/5  08/07/23: Continue 05/31/2023: Continue 05/10/23: Right: shoulder flexion: 3+/5, abduction: 4-/5, elbow flexion: 4+/5 , extension: 4-/5 wrist extension: 2/5; Left: shoulder flexion: 4+/5, abduction: 4+/5, elbow flexion: 5/5 , extension: 4/5  wrist extension: 3/5 03/13/2023: Right: shoulder flexion: 3/5, abduction: 4/5, elbow flexion: 4+/5 , extension: 4-/5 wrist extension: 2/5; Left: shoulder flexion: 4+/5, abduction: 4+/5, elbow flexion: 5/5 , extension: 4/5  wrist extension: 3/5 03/08/23: NT this date d/t as pt arrived late; 02/13/2023: Continue 01/09/2023: Continue 12/28/2022: Right: shoulder flexion: 3/5, abduction: 4/5, elbow flexion: 4+/5 , extension: 4-/5 wrist extension: 2/5; Left: shoulder flexion: 4+/5, abduction: 4+/5, elbow flexion: 5/5 , extension: 4/5  wrist extension: 3/5 Right: shoulder flexion: 3+/5, abduction: 4/5, elbow flexion: 4+/5 , extension: 4+/5 wrist extension: 2/5; Left: shoulder flexion: 4+/5, abduction: 4+/5, elbow flexion: 5/5 , extension: 4+/5 wrist extension: 3-/5 11/16/2022: Right: shoulder flexion: 3/5, abduction: 4/5, elbow flexion: 4+/5 , extension: 4+/5 wrist extension: 2/5; Left: shoulder flexion: 4+/5, abduction: 4+/5, elbow flexion: 5/5 , extension:  wrist extension: 3-/5 Right: shoulder flexion: 3+/5, abduction: 4/5, elbow flexion: 4+/5 , extension: 4+/5 wrist extension: 2/5; Left: shoulder flexion: 4+/5, abduction: 4+/5, elbow flexion: 5/5 , extension: 4+/5 wrist extension: 3-/5 Goal status:  Ongoing  11. Pt. will improve right wrist extension by 10 degrees to initiate reaching for items in preparation for ADLs. Baseline:03/18/24:  Right wrist extension: -36 Right wrist extension: -38(-6) 01/08/24: Continue, carry goal over from recertification update last session.  12/27/23: Right wrist extension: -38(-6) 10/23/23: -30(-8) 08/23/2023: R Wrist Ext: -31(-8)  08/07/23: Continue 05/31/23: Continue 05/10/23: Right wrist extension: -32(22) 03/13/23: -22(35) 03/08/23: NT this date as pt arrived late; 02/13/2023: Continue 01/09/2023: Continue 12/28/2022: -22(34) 11/16/2022: -30(10)    Goal status: Ongoing      12. Pt. Will require donn/doff a Tank top T-shirt efficiently with supervision and full set-up.    Baseline: 03/18/24: Independent doffing, Pt. Reports assist is required for pulling the T-shirt Tank down in the back.01/08/24: Continue, carry goal over from recertification update last session. 12/27/23: Independent doffing, MaxA doffing 10/23/23: Independent doff the shirt/tank, MaxA  donning. 10/23/23: Pt. Is able to independently doff his shirt, requiring assist to lean forward and loosen the back. 08/07/23: Continue 05/31/2023: Pt. Requires maxA donning a pullover sweatshirt, Mod-maxA doffing it. T-shirt tank TBD 05/10/23: Pt. Presents with difficulty completing, and requires increased time to complete    Goal status: Ongoing    13. Pt. Will independently perform bilateral/bimanual hand tasks needed to fold towels/linens/laundry Baseline: 03/18/24: Continue goal 01/08/24: Continue, carry goal over from recertification update last session. 12/27/23: Pt. Has not been able to work on the this  2/2 multiple cancellations this recertification 2/2 illness, familiy health issues,and transportation. 10/23/23: Pt. Continues to have difficulty folding laundry, and linens. 08/23/23: Pt. Continues to have difficulty using bilateral/bimanual hand tasks needed to fold towels/linens/laundry. 08/07/23: Continue 05/31/23:  Continue 05/10/23:  Pt. Has difficulty folding towels/lines/laundry using bilateral hands Goal status:Ongoing  14. Pt. Will independently, and efficiently reach his right arm over the side of the chair to pet his dog.    Baseline: 08/23/2023: Pt. Is now able to reach over the side of the armrest of his recliner chair to pet his dog.  08/07/23: Continue 05/31/2023: Continue 05/10/23: Pt. is  unable to efficiently reach his right UE over the side of his recliner chair to pet his dog.    Goal status: Achieved    15. Pt. Will independently, and efficiently reach out in front of him to efficiently place items onto his table while sitting in his recliner.     Baseline: 03/28/24:  Pt. is able to reach out to efficiently reach out in front of him to place items on the table with the left hand, continue to work towards efficiency, as well as using the RUE. 01/08/24: Continue, carry goal over from recertification update last session. 12/27/2023: Pt. is able to reach out to efficiently reach out in front of him to place items on the table 90% of the time with the LUE, 10% of the time with the RUE 2/2  increased flexor tone. 10/23/23: Pt. Is improving with functional reaching with the left UE out in front of him to place items onto the table. Pt. has difficulty reaching out with the right hand to  place items on the table. 08/23/2023: Pt. has difficulty consistently reaching out in front of him to place items on the on a tabletop surface with the right hand. 08/07/23: Continue 2/26/225: Continue 05/10/23: Pt. has difficulty reaching out in front of him far enough to efficiently place items on the table while sitting in a recliner chair.    Goal status: Continue/Ongoing     16. Pt. Will improve bilateral hand supination by 10 degrees to be able actively turn items in his hand to look at them independently and efficiently during ADLs/IADLs,    Baseline: 03/28/24:  Right: Supination is limited by increased tone, and tightness;  Left: 26(38) 01/08/24: Continue, carry goal over from recertification update last session. 12/27/23: Right: Unable 2/2 increased tightness  Left: supination 20(38) 10/23/23: Right: unable, L: supination 30(38)    Goal status: Continue/Ongoing      17. Pt. Will independently, and efficiently transition between picking up finger foods for dipping, and  picking up utensils , then placing them down on the tabletop surface through 75% of the meal with minimal cues.    Baseline: 03/18/24: Continue, carry goal over from recertification update last session. 12/27/23: Pt. is able to consistently transition between picking up finger foods for dipping, and utensils consistently with the left hand with 100% accuracy, and continues to work on  accuracy with the right hand 2/2 limitations from flexor tone, and tightness prior to Botox.  01/08/24: Continue, carry goal over from recertification update last session. 12/27/23: Pt. is able to consistently transition between picking up finger foods for dipping, and utensils consistently with the left hand with 100% accuracy time, and the right hand with 5% accuracy. 10/23/23: Pt. requires heavy cues, and assist to grasp finger foods, and utensils, and transition between the two.    Goal status: Continue/Ongoing      18. Pt. Will independently transfer small ADL items from one hand to the other efficiently.    Baseline: 03/18/24:  Pt. Requires increased time to transfer items between his hands. Pt. Is able to transfer 1.5 discs between his hands with 60% accuracy    Goal status: New      19. Pt will independently perform controlled hand to face patterns using bilateral hands symmetrically, and efficiently in preparation for being able to intiate donning, and doffing glasses safely.    Baseline: 03/18/24: Pt. Has difficulty using bilateral hand symmetrically, and efficiently for controlled hand to face patterns of movement.  Goal status: New    ASSESSMENT:  CLINICAL  IMPRESSION:  Pt. was late arriving for the OT treatment session this afternoon. Pt. tolerated the bilateral hand exercises well without reports of pain or discomfort. Pt. presents with increased ROM 2/2 decreased tightness. Pt. requires cues, and assist/support to incorporate his right hand during the folding task using bilateral hands. Pt.'s caregivers continue to be very supportive to continue working towards improving BUE functioning, ROM, strength, motor control, [facility] skills in order to maximize independence with daily ADLs, and IADL functioning. Plan to continue with the same goals into the next recertification period.   PERFORMANCE DEFICITS in functional skills including ADLs, IADLs, coordination, dexterity, proprioception, ROM, strength, pain, FMC, GMC, decreased knowledge of use of DME, and UE functional use, cognitive skills including safety awareness, and psychosocial skills including coping strategies, environmental adaptation, habits, and routines and behaviors.   IMPAIRMENTS are limiting patient from ADLs, IADLs, leisure, and social participation.   COMORBIDITIES may have co-morbidities  that affects occupational performance. Patient will benefit from skilled OT to address above impairments and improve overall function.  MODIFICATION OR ASSISTANCE TO COMPLETE EVALUATION: Maximum or significant modification of tasks or assist is necessary to complete an evaluation.  OT OCCUPATIONAL PROFILE AND HISTORY: Comprehensive assessment: Review of records and extensive additional review of physical, cognitive, psychosocial history related to current functional performance.  CLINICAL DECISION MAKING: High - multiple treatment options, significant modification of task necessary  REHAB POTENTIAL: Fair    EVALUATION COMPLEXITY: High    PLAN: OT FREQUENCY: 2 x's a week  OT DURATION:12 weeks  PLANNED INTERVENTIONS: self care/ADL training, therapeutic exercise, therapeutic activity,  neuromuscular re-education, manual therapy, passive range of motion, patient/family education, and cognitive remediation/compensation RECOMMENDED OTHER SERVICES: PT  CONSULTED AND AGREED WITH PLAN OF CARE: Patient and family member/caregiver  PLAN FOR NEXT SESSION: Review HEP   Richardson Otter, MS, OTR/L  04/01/24

## 2024-04-03 ENCOUNTER — Ambulatory Visit: Admitting: Occupational Therapy

## 2024-04-03 ENCOUNTER — Ambulatory Visit

## 2024-04-03 DIAGNOSIS — R269 Unspecified abnormalities of gait and mobility: Secondary | ICD-10-CM | POA: Diagnosis not present

## 2024-04-03 DIAGNOSIS — M6281 Muscle weakness (generalized): Secondary | ICD-10-CM | POA: Diagnosis not present

## 2024-04-03 DIAGNOSIS — R278 Other lack of coordination: Secondary | ICD-10-CM

## 2024-04-03 DIAGNOSIS — H543 Unqualified visual loss, both eyes: Secondary | ICD-10-CM

## 2024-04-03 DIAGNOSIS — R2681 Unsteadiness on feet: Secondary | ICD-10-CM

## 2024-04-03 DIAGNOSIS — R262 Difficulty in walking, not elsewhere classified: Secondary | ICD-10-CM

## 2024-04-03 DIAGNOSIS — I6930 Unspecified sequelae of cerebral infarction: Secondary | ICD-10-CM | POA: Diagnosis not present

## 2024-04-03 DIAGNOSIS — R2689 Other abnormalities of gait and mobility: Secondary | ICD-10-CM

## 2024-04-03 NOTE — Therapy (Signed)
 . Occupational Therapy Neuro Treatment Note    Patient Name: Paul Hall MRN: [id number] DOB:Sep 06, 1995, 28 y.o., male Today's Date: 04/03/2024  PCP: Dr. Bertell REFERRING PROVIDER: Dr. Bertell   .  OT End of Session - 04/03/24 1743     Visit Number 122    Number of Visits 180    Date for Recertification  06/10/24    OT Start Time 1537    OT Stop Time 1615    OT Time Calculation (min) 38 min    Equipment Utilized During Treatment tilt in space power wc    Activity Tolerance Patient tolerated treatment well    Behavior During Therapy WFL for tasks assessed/performed            Past Medical History:  Diagnosis Date   Diabetes mellitus (HCC)    Hypertension    Stroke [facility])    Past Surgical History:  Procedure Laterality Date   IVC FILTER PLACEMENT ([facility] HX)     LEG SURGERY     PEG TUBE PLACEMENT     TRACHEOSTOMY     Patient Active Problem List   Diagnosis Date Noted   Sepsis due to vancomycin  resistant Enterococcus species (HCC) 06/06/2019   SIRS (systemic inflammatory response syndrome) (HCC) 06/05/2019   Acute lower UTI 06/05/2019   VRE (vancomycin -resistant Enterococci) infection 06/05/2019   Anemia 06/05/2019   Skin ulcer of sacrum with necrosis of muscle (HCC)    Urinary retention    Type 2 diabetes mellitus without complication, with long-term current use of insulin  (HCC)    Tachycardia    Lower extremity edema    Acute metabolic encephalopathy    Obstructive sleep apnea    Morbid obesity with BMI of 60.0-69.9, adult (HCC)    Goals of care, counseling/discussion    Palliative care encounter    Sepsis (HCC) 04/27/2019   H/O insulin  dependent diabetes mellitus 04/27/2019   History of CVA with residual deficit 04/27/2019   Seizure disorder (HCC) 04/27/2019   Decubitus ulcer of sacral region, stage 4 (HCC) 04/27/2019   ONSET DATE: 01/2019  REFERRING DIAG: CVA/COVID-19  THERAPY DIAG:  Muscle weakness (generalized)  Rationale for Evaluation  and Treatment Rehabilitation  SUBJECTIVE:    SUBJECTIVE STATEMENT:   Pt. reports doing well today   Pt accompanied by: self and family member  PERTINENT HISTORY:  Pt. is a 29 y.o. male who was diagnosed with COVID-19, and CVA with resultant quadriplegia. Pt. Was then hospitalized with VRE UTI. PMHx includes: urinary retention, seizure disorder, obstructive sleep disorder, DM Type II, Morbid obesity.   PRECAUTIONS: None  WEIGHT BEARING RESTRICTIONS No  PAIN:   03/06/24: No pain  FALLS: Has patient fallen in last 6 months? No  LIVING ENVIRONMENT: Lives with: lives with their family Lives in: House/apartment Stairs: No Level Entry Has following equipment at home: Wheelchair (power) and hoyer lift, sit to stand lift  PLOF: Independent  PATIENT GOALS: To be able to engage in more daily care tasks.  OBJECTIVE:   HAND DOMINANCE: Right  ADLs:  Transfers/ambulation related to ADLs: Eating: Pt. reports being able to hold standard utensils, and is starting to engage more in self-feeding tasks, hand to mouth patterns. Pt. Reports that he does as much as he can with the task, and family assists  with the remainder of the task. Grooming: Pt. is able to initiate holding an electric toothbrush, and brush his teeth. Family assists LB Dressing: Total Assist UE dressing: Pt. is now able to reach up  to actively assist with grasping , and pulling his gown down. Toileting:  Total Assist Bathing: MaxA UB, Total assist LB Tub Shower transfers: N/A Equipment: See above  IADLs: Shopping: Relies on family to assist Light housekeeping: Total Assist Meal Prep: Total Assist Community mobility:   Medication management:  Total Assist  Financial management: N/A Handwriting: Not legible: Pt. Is able to hold a pen with the left hand, and initiate marking the page. Pt.'s eye glasses were not available  MOBILITY STATUS:  Power w/c  POSTURE COMMENTS:  Pt. Requires position changes in his power  w/c  ACTIVITY TOLERANCE: Activity tolerance:  Fair  FUNCTIONAL OUTCOME MEASURES: FOTO: 40 TR score: 45  05/25/2022:   FOTO: 50 TR score: 45  07/20/22: FOTO 55  03/08/23: 51  UPPER EXTREMITY ROM     Active ROM Left eval Left  03/21/2022 Left 05/25/2022 L  07/20/22 Left 07/25/2022 Left  08/15/2022 Left  09/26/2022 Left  11/16/2022 Left 12/28/2022 Left 03/13/23 Left 05/10/23 Left 10/23/23 Left 12/27/23 Left 03/18/24  Shoulder flexion 119 123 125 130  136 138 130 142 144 144 148 148 150  Shoulder abduction 110 115 115 115  118 121 125 142 140 140 140 140 145  Shoulder adduction                Shoulder extension                Shoulder internal rotation                Shoulder external rotation                Elbow flexion 135(135) 145 145 145  145    145 145  145 146  Elbow extension -27(-18) -21(-20) -20(-16)  -25(-10) -23(-10) -21(-10) -20(-18) -20(-18) -20(-17) -17(-16) -12(-8) -12(-6) -8(-4)  Wrist flexion                Wrist extension 10(50) 19(50) 28(50)  30(50) 35(55) 36(55) 36(60) 40(60) 45(60) 45(60) 42(54) 36(54) 48(58)  Wrist ulnar deviation                Wrist radial deviation                Wrist pronation            60(74) 84(84) 90(90)  Wrist supination   Limited by flexor tone       10(15)  30(38) 20(38) 26(38)  (Blank rows = not tested)    Active ROM Right eval Right 03/21/2022 Right 05/25/2022 R  07/20/22 Right  07/25/22 Right 08/15/2022 Right 09/26/2022 Right 11/16/2022 Right 12/28/2022 Right 03/13/23 Right 05/10/23 Right 10/23/23 Right  9/24 /25 Right 03/18/24  Shoulder flexion 106 scaption 105  Scaption 62 flexion 110 scaption 100  105 105 105 112 Scaption 123 Scaption 122  Scaption 142 Scaption 142 144  Shoulder abduction 114 90 97 100  105 117 120 124 124 122 144 146 140  Shoulder adduction                Shoulder extension                Shoulder internal rotation                Shoulder external rotation                Elbow flexion  120(130) 145 145 140  144 140 142 148 148 145  145 148  Elbow extension -  45(-35) -22(-35) -28(-22)  -32(-21) -32(-28) -28(-26) -28(-28) -27(-26) -27(-40) -38(-35) -34(-26) -34(-26) -36(-25)  Wrist flexion                Wrist extension -30(10) -25(10) -10(30)  -10(20) -10(25) -20(40) -30(10) -22(34) -22(35) -32(22) -30(-8) -38(-6) -36  Wrist ulnar deviation                Wrist radial deviation                Wrist pronation                Wrist supination   Limited by flexor tone                  12/28/2022: Thumb radial abduction: Right: 12(46), Left: 40(54)  Digits: Bilateral 2nd through 5th digit: MP Hyperextension with PIP, an DIP flexion    10/23/23:  Bilateral 2nd through 5th digit MP hyperextension with PIP, and DIP flexion: Pt. has been intermittently able to initate active diigt PIP, and DIP extension following Botox during this past recertification/progress reporting period.     UPPER EXTREMITY MMT:     Left eval Left 03/21/2022 Left  05/25/2022 L 07/20/22 Left  08/15/2022 Left 09/26/2022 Left 11/16/2022 Left 12/28/2022 Left  03/13/2023 Left  05/10/23 Left 10/23/23 Left 12/27/23 Left 03/18/24  Shoulder flexion 3-/5 3/5 3+/5 4+/5 4+/5 4+/5 4+/5 4+/5 4+/5 4+/5 4+/5 4+/5 4+/5  Shoulder abduction 3-/5 3-/5 4-/5 4+/5 4+/5 4+5 4+/5 4+/5 4+/5 4+/5 4+/5 4+/5 4+/5  Shoulder adduction               Shoulder extension               Shoulder internal rotation               Shoulder external rotation               Elbow flexion 3/5 3+/5 4-/5 4+/5 5/5 5/5 5/5 5/5 5/5 5/5 5/5 5/5 5/5  Elbow extension 2-/5 2/5  4+/5 4+/5 4+/5  4/5 4/5 4/5 through available range 4+/5 through available range 4+/5 4+/5  Wrist flexion               Wrist extension 2/5 2+/5 3-/5  3-/5 3-/5 3-/5 3/5 3/5 3/5 3+/5 3-/5 3-/5  Wrist ulnar deviation               Wrist radial deviation               Wrist pronation               Wrist supination               (Blank rows = not tested)  Right eval Right  03/21/2022 Right  05/25/2022 R 07/20/22 Right 08/15/2022 Right 09/26/2022 Right 11/16/2022 Right 12/28/2022 Right 03/13/2023 Right 05/10/23 Right 10/23/23 Right 12/27/23 Right 03/18/24  Shoulder flexion 3-/5 scaption 3-/5 3-/5 3+/5 3+/5 3+/5 3/5 3/5 3/5 3+/5 4-/5 4/5 4/5  Shoulder abduction 3-/5 3-/5 3-/5 4/5 4/5 4/5 4/5 4/5 4/5 4/-5 4-/5 4/5 4/5  Shoulder adduction               Shoulder extension               Shoulder internal rotation               Shoulder external rotation               Elbow flexion 3/5  3/5 3+/5 4/5 4+/5 4+/5 4+/5 4+/5 4+/5 4+/5 4+/5 5/5 5/5  Elbow extension 2-/5 2/5 3-/5 4+/5 4+/5 4+/5 4+/5 4-/5 4-/5 4-/5 4-/5 through available range 3-/5 3-/5  Wrist flexion               Wrist extension 2-/5 2-/5 2/5 2/5 2/5 2/5 2/5 2/5 2/5 2/5 2/5 2-/5 2-/5  Wrist ulnar deviation               Wrist radial deviation               Wrist pronation               Wrist supination                 HAND FUNCTION:  Grip strength: Right: 0 lbs; Left: 0 lbs and Lateral pinch: Right: 5 lbs, Left: 2 lbs  03/21/2022: Lateral pinch: Right: 3.5 lbs, Left: 2 lbs  07/20/22: Grip strength: Right: 3 lbs; Left: 4 lbs and Lateral pinch: Right: 5 lbs, Left: 3 lbs  08/15/2022:  Grip strength: Right: 0 lbs; Left: 5 lbs and Lateral pinch: Right: 2 lbs, Left: 4 lbs  09/26/2022  Grip strength: Right: 0 lbs; Left: 8 lbs and Lateral pinch: Right: 2 lbs, Left: 5 lbs  11/16/2022  Grip strength: Right: 0 lbs; Left: 8 lbs  9/25/20204  Grip strength: Right: 0 lbs; Left: 8 lbs  Grip strength: Right: 0 lbs; Left: 8 lbs   03/08/23:  Grip strength: Right: 0 lbs without PROM, 5 lbs after PROM, Left 8 lbs  05/10/23:  Grip strength: TBD  Bilateral digit PIP/DIP flexion contractures with MP hyperextension with attempts for AROM. Pt. is able to tolerate AROM to the bilateral digits at the initial evaluation however, has a history of pain in the digits.  COORDINATION: Eval: Pt. is unable to grasp  9-hole test pegs. Pt. is able to initiate grasping larger pegs, and is able to hold a pen in the left hand.  07/20/22: 2 min 36 seconds to remove 9 pegs from 9 hole peg test - cues to locate pegs 2/2 low vision. Pt. is able to initiate grasping larger pegs on R hand and is able to hold a pen in the left hand.  08/15/2022:    Vision, and sensation limiting accuracy of 9 hole peg test results. Pt. Was able to grasp and remove vertical pegs intermittently with cues.    05/10/23: TBD  03/18/24:  Box and Blocks:  Right:  0 blocks completed in 1 min., Left: 7 blocks completed in 1 min.  SENSATION: Light touch: Impaired   EDEMA:  N/A  MUSCLE TONE: BUE flexor Spasticity  COGNITION Overall cognitive status: Continue to assess in functional context  VISION:   Subjective report: Pt. was not wearing glasses at the time of the initial eval.  Baseline vision: Vision is very limited. Wears glasses all the time Visual history: History of impaired vision following CVA. Pt. Has received [facility] through [facility] low vision rehabilitation program.   VISION ASSESSMENT: Impaired To be further assessed in functional context  PERCEPTION: Impaired   PRAXIS: Impaired: motor planning  OBSERVATIONS:  Pt reports being on Tramadol   TODAY'S TREATMENT : 04/01/24  Moist heat modality to the RUE, and bilateral hands in conjunction with ROM   Therapeutic Ex.:    -BUE tolerated PROM perrformed with emphasis placed on slow prolonged gentle stretching of the Digit MCP/PIP/DIPs of bilateral hands in conjunction with moist heat modality  2/2 flexor tone, tightness, and stiffness.  Therapeutic Activities:   -Facilitated bilateral hand coordination skills needed for folding linens at the tabletop surface with emphasis placed on incorporating the RUE while   using the bilateral hands in midline modifying the task from full size towels grading to wash cloths -Emphasis was placed on accuracy of aligning the  corners.   PATIENT EDUCATION: Education details: ROM, BUE tone  Person educated: Patient and Parent Education method: Explanation Education comprehension: verbalized understanding  HOME EXERCISE PROGRAM:    Incorporating  the right hand during self-feeding tasks.   GOALS: Goals reviewed with patient? Yes  SHORT TERM GOALS: Target date: 04/29/2024     To assess splint fit, and make appropriate adjustments to promote good skin integrity through the palmar surface of the bilateral hands.  Baseline: 05/25/22: Goal currently met, however ongoing as needs to assess splint fit arise. 03/23/2022: Pt. is wearing splints a couple of hours at night bilateral resting hand splints. 03/21/2022: Pt. is wearing splints a couple of hours at night bilateral resting hand splints. Goal status: Deferred   LONG TERM GOALS: Target date: 06/10/2024     FOTO score will Improve by 2 points for Pt. perceived improvement with the assessment specific ADL/IADL tasks.  Baseline: 03/08/23: 51; 12/28/2022: FOTO score: 56; 11/16/2022: 52 09/27/2022: 51 07/20/2022: FOTO 55 05/25/2022: FOTO score: 50,  TR score: 45 Eval: FOTO score: 40, TR score: 45 Goal status: Achieved   2.   Pt. will independently perform oral care for 100% of the task after complete set-up. Baseline: 05/10/23: Pt. Performs 90% of the task. Pt.'s mother assists with 10% of the task for thorough cleaning the hard to reach places way in the back of the oral cavity.03/08/23: not as much assist required proximally, but to adjust positioning of toothbrush; 02/13/23: Pt. Continues to require assist proximally 01/09/2023: Continue 12/28/2022:  Pt. Continues to complete 90% of the task with complete set-up, with visual cues, and occassional assist of the right elbow. 11/16/2022: Pt. Completes 90% of the task with complete set-up. 09/26/2022: Completes 90% of the task, proximal assist at times required at the elbow.  08/15/2022: Completes 90% of the task, proximal assist at  times required at the elbow. 07/20/22: completes 90% of task, limited by shoulder flexion. 05/25/2022:  Pt. Is able to initiate and perform oral care for approximately 90% of the task. Complete set-up required. Assi needed only for the very back teeth. 03/23/2022: Pt. Is able to initiate and perform oral care for approximately 75% of the task. Pt. Requires assist at proximally at the elbow for through oral care. 03/21/2022: Pt. Is able to initiate and perform oral care for approximately 75% of the task. Pt. Requires assist at proximally at the elbow for through oral care. Eval: Pt. is able to initiate using an electric toothbrush. Pt. requires assist for set-up, and assist for thoroughness, and as he Pt. fatigues. Goal status:  Partially met, Goal discontinued  3.  Pt. Will be modified independence with self-feeding for 100% using a swivel spoon, and standard fork after complete set-up Baseline: 05/10/2023: Pt. feeds self 95-100% of the meal. with occasional assist to reposition the utensil. 03/08/23: indep with cup with a handle and lid, assist to reposition eating utensils in hand;  02/13/23:  Pt. Is now able to consistently, and efficiently use a swivel spoon for eating cereal with the left hand.01/09/2023: Continue 12/28/2022: Pt. Is now feeding himself 90% of the time using a swivel spoon with the  left hand, and 95% of the time with the fork. Pt. Continues to have difficulty with cutting food. 11/16/2022: 100% with finger foods, Pt. Is initiating with a swivel spoon, and universal cuff for fork use, however requires assist for consistency, and accuracy.  09/26/2022: 90% using the spoon, assist at time with the forearm motion, and grip on the utensils. 100% for finger foods.  08/15/2022:  90% using the spoon, assit at time with the forearm motion, and grip on the utensils. 100% for finger foods-independent with set-up- unsupervised.  07/20/22: self-feeds cereal using spoon 90% of task. 05/25/2022: Pt. Is able to use a  spoon to scoop cereal when feeding himself cereal 85% of the time. Pt. Is able to feed himself snack/finger foods 100% of the time. Pt. Continues to work on consistency of  stabilizing a cup/mug when drinking. Pt. Is able to grasp a water  bottle with assist initially, with assist tapering off as he drinks.03/23/2022: Pt. Is able to perform scooping cereal for 75% of the time. Pt. required assist, and support at the left elbow, and Pt. Presents with limited forearm supination when using the spoon, and bringing it towards his mouth. Pt. Is able to use a fork to spear items, and perform the hand to mouth pattern.  03/21/2022: Pt. Is able to perform scooping cereal for 75% of the time. Pt. required assist, and support at the left elbow, and Pt. presents with limited forearm supination when using the spoon, and bringing it towards his mouth. Pt. Is able to use a fork to spear items, and perform the hand to mouth pattern.  Eval: Pt. is able to hold standard standard utensils. Pt. Performs as much of the task as he, can and has assistance for the remainder. Goal status:  Improved, Achieved  4.  Pt. will improve grasp patterns and consistently grasp 1/4 objects for ADL, and IADL tasks.  Baseline: 03/18/24: Pt. is able to grasp thin objects including chips, and crackers, and dics. Pt. Continues to work towards grasping progressively smaller objects. 01/08/24: Continue, carry goal over from recertification update last session. 12/27/23: Pt. is able to present with increased tightness  2/2 being way overdue for Botox injections, which limits consistency with grasping small objects. 10/23/2023: Pt. is able to initiate grasping small objects visual cues, and increased time, however continues to work towards grasping 1/4 objects efficiently. 08/27/23: Pt. is able to grasp flat discs, and sustain a secure hold on them while moving the UE through various planes. 08/07/23: Continue 05/31/2023: Continue 05/10/23: Pt. is consistently  able to grasp 1/2 objects with visual cues. Pt. Requires assist, and increased visual cues to grasp 1/4 objects on an elevated plane.  Pt. Is unable to grasp the flat objects at the tabletop surface. Pt. Is grasping grasping small puppy vitamins.03/08/23: 1/2 objects efficiently; 02/13/2023: 1/2 objects efficiently 01/09/2023: Continue 12/28/2022: Continues to grasp 1/4 pegs with min a + visual cues, consistently grasping 1/2 objects with visual cues 11/16/2022: Continues to grasp 1/4 pegs with min a + visual cues, consistently grasping 1/2 objects with visual cues 09/26/2022: Continues to grasp 1/4 pegs with min a + visual cues, consistently grasping 1/2 objects with visual cues. 08/15/2022: grasps 1/4 pegs with min a + visual cues, consistently grasping 1/2 objects with visual cues. 07/20/22: grasps 1/4 pegs with min a + visual cues, consistently grasping 1/2 objects with visual cues. 05/25/2022: Pt. Is working on improving consistency of grasping 1/2 objects with visual cues.  03/23/2022: Pt. Is able to  grasp 1 objects consistently,and continues to work on the hand patterns needed to grasp 1/2 objects.03/21/2022: Pt. Is able to grasp 1 objects consistently,and continues to work on the hand patterns needed to grasp 1/2 objects. Eval: Pt. is able to grasp 1 objects intermittently using a lateral grasp pattern. Goal status: Ongoing  5.  Pt. will independently write his name legibly with letter sizes under 1. Baseline:  05/10/23: Deferred 03/08/23: Not addressed this period; 02/13/2023: Continue 01/09/2023: Continue. 12/28/2022: Pt. is now using an IPad Pen. 11/16/2022: Pt. Continues to be able to write name with smaller letter size. Pt. Is signing name with a computer styus. Pt. Requires visual cues, and assist 09/26/2022: Pt. Continues to be able to write name with smaller letter size. Pt. Is signing name with a computer styus. Pt. Requires visual cues, and assist. 08/15/2022: Pt. Is able to write name  with smaller letter size. Pt. Is signing name with a computer styus. Pt. Requires visual cues, and assist. 07/20/22: stabilizing assist to write name with 75% legibility with 2 letters. 05/25/2022: Pt. to continue to work towards formulating grasp patterns in preparation for grasping a large width pen. Pt. Requires visual cues. 03/23/2022: Pt. Is able to write his name with modA, however has difficulty with formulating letter sizes less than 2 in size with 50% legibility for the 3 letters of his name.03/21/2022: Pt. Is able to write his name with modA, however has difficulty with formulating letter sizes less than 2 in size with 50% legibility for the 3 letters of his name. Eval: Pt is able to hold a thin marker with his left hand, and formulate a line, and initiate a circular pattern (Pt. without glasses today) Goal status: Deferred  6. Pt. Will reach up to comb/brush his hair with minA.  Baseline: 05/10/23: Pt. is able to use a pick independently with the left hand with occasional assist to the far side of the left his head. 03/08/23: Pt uses pick in L hand to reach 75% of his head; unable to reach R side of head; 02/13/2023:Pt. is able to use a hair pick with the left hand on the right side of his head, top, and back of the head. Pt. Continues to have difficulty reaching further to the right side of his head with the left. 01/09/2023: Continue 12/28/2022: Pt. Is progressing, and is now able to use a hair pick with the left hand on the right side of his head, top, and back of the head. Pt. Continues to have difficulty reaching further to the right side of his head with the left. 11/16/2022: Pt. Is able to use a hair pick for the left side of his head using his left hand. Pt. Presents with difficulty reaching to the right side of his head.09/26/2022: Pt. Is able to use a hair pick for the left side of his head using his left hand. Pt. Presents with difficulty reaching to the right side of his head. 5/13/202: 75%  using a hair pick, assist to the right side of the head. 07/20/22: reaches 75% of head, assist for far R side of head, fatigues quickly. 05/25/2022: Pt. Is able to reach up with the left hand to the left side, top, and back of his head. 03/23/2022: Pt. is now able to more consistently initiate reaching up to his head with his left hand in preparation for haircare12/18/2023: Pt. is now able to more consistently initiate reaching up to his head with his left hand in preparation for  haircare. Eval: Pt. is able to initiate reaching up for hair care with a long handled brush, however is unable to sustain UE's in elevation to perform the task.     Goal status: Achieved  7. Pt. Will independently navigate the w/c through his environment with minA with visual scanning, and hand placement on the controls.  Baseline: 11/16/2022: Deferred 2/2 vision 09/26/2022: Pt. Is now navigating his w/c ouside the home, and around his block. 08/15/2022: Pt. Requires minA to setup hand on controls and MIN cues to navigate the w/c in wide spaces, requires MIN - MOD cues to navigate the w/c through more narrow doorways, and tighter turns - varies based on good vs bad vision days.07/20/22: Pt. Requires minA to setup hand on controls and MIN cues to navigate the w/c in wide spaces, requires MIN - MOD cues to navigate the w/c through more narrow doorways, and tighter turns - varies based on good vs bad vision days. 05/25/2022: Pt. Requires minA  and  cues to navigate the w/c in wide spaces, and requires Mod cues to navigate the w/c through more narrow doorways, and tighter turns. Pt. Requires max cues for scanning through the environment, and moderate cues for hand placement on the controls.     Goal status: Deferred 2/2 vision    8. Pt. Will improve bilateral grip strength to be able to independently grasp, and pull up blankets, and linens.   Baseline: 05/10/23: Deferred 03/08/23: R grip 0-5 lbs, L grip 8 lbs; pt reports he can consistently pull  up blankets and linens independently.  02/13/2023: Continue. Pt. presents with digit PIP, and DIP flexor tightness. 01/09/2023: Continue 12/28/2023: R: 0#, L: 5# Pt. Is now able to more consistently grasp and pull blankets up over him. 11/16/2022: R: 0 L: 8# Pt. Is more consistently grasping his blankets and attempting to pull them up. 09/26/2022: R: 0  L: 8# Pt. is attempting to grasp, and pull blankets up more, using mostly the left hand. 08/15/2022: Pt. has difficulty securely holding, and pulling up blankets, and linens.    Goal status:Deferred  9. Pt. will consistently actively control the releasing of blankets, covers, and linens from his hands once they are in the desired position over him. Baseline: 12/28/2022: Pt. Is consistently actively able to release linens once they are in the desired position over him. 11/16/2022: Pt. continues to improve with actively releasing blankets form his hands. 09/26/2022: Pt. is improving with actively releasing blankets form his hands. 08/15/2022: Pt. has difficulty with controlled releasing of blankets/linens from his grasp.     Goal status: Achieved    10. Pt. Will improve bilateral UE strength by 2 MM grades to assist with ADLs, and IADLs  Baseline: 03/18/24: Right: shoulder flexion: 4/5, abduction: 4/5, elbow flexion: 5/5 , extension: 3-/5 wrist extension: 2-/5; Left: shoulder flexion: 4+/5, abduction: 4+/5, elbow flexion: 5/5 , extension: 4+/5  wrist extension: 3+/5   01/08/24: Continue, carry goal over from recertification update last session. 12/27/23:  Right: shoulder flexion: 4/5, abduction: 4/5, elbow flexion: 5/5 , extension: 3-/5 wrist extension: 2-/5; Left: shoulder flexion: 4+/5, abduction: 4+/5, elbow flexion: 5/5 , extension: 4+/5  wrist extension: 3+/5  08/27/23:  Right: shoulder flexion: 3+/5, abduction: 4-/5, elbow flexion: 4+/5 , extension: 4-/5 wrist extension: 2/5; Left: shoulder flexion: 4+/5, abduction: 4+/5, elbow flexion: 5/5 , extension: 4+/5  wrist  extension: 3-/5  10/23/2023: Right: shoulder flexion: 4-/5, abduction: 4-/5, elbow flexion: 4+/5 , extension: 4-/5 wrist extension: 2/5; Left: shoulder flexion:  4+/5, abduction: 4+/5, elbow flexion: 5/5 , extension: 4+/5  wrist extension: 3+/5  08/27/23:  Right: shoulder flexion: 3+/5, abduction: 4-/5, elbow flexion: 4+/5 , extension: 4-/5 wrist extension: 2/5; Left: shoulder flexion: 4+/5, abduction: 4+/5, elbow flexion: 5/5 , extension: 4/5  wrist extension: 3/5  08/07/23: Continue 05/31/2023: Continue 05/10/23: Right: shoulder flexion: 3+/5, abduction: 4-/5, elbow flexion: 4+/5 , extension: 4-/5 wrist extension: 2/5; Left: shoulder flexion: 4+/5, abduction: 4+/5, elbow flexion: 5/5 , extension: 4/5  wrist extension: 3/5 03/13/2023: Right: shoulder flexion: 3/5, abduction: 4/5, elbow flexion: 4+/5 , extension: 4-/5 wrist extension: 2/5; Left: shoulder flexion: 4+/5, abduction: 4+/5, elbow flexion: 5/5 , extension: 4/5  wrist extension: 3/5 03/08/23: NT this date d/t as pt arrived late; 02/13/2023: Continue 01/09/2023: Continue 12/28/2022: Right: shoulder flexion: 3/5, abduction: 4/5, elbow flexion: 4+/5 , extension: 4-/5 wrist extension: 2/5; Left: shoulder flexion: 4+/5, abduction: 4+/5, elbow flexion: 5/5 , extension: 4/5  wrist extension: 3/5 Right: shoulder flexion: 3+/5, abduction: 4/5, elbow flexion: 4+/5 , extension: 4+/5 wrist extension: 2/5; Left: shoulder flexion: 4+/5, abduction: 4+/5, elbow flexion: 5/5 , extension: 4+/5 wrist extension: 3-/5 11/16/2022: Right: shoulder flexion: 3/5, abduction: 4/5, elbow flexion: 4+/5 , extension: 4+/5 wrist extension: 2/5; Left: shoulder flexion: 4+/5, abduction: 4+/5, elbow flexion: 5/5 , extension:  wrist extension: 3-/5 Right: shoulder flexion: 3+/5, abduction: 4/5, elbow flexion: 4+/5 , extension: 4+/5 wrist extension: 2/5; Left: shoulder flexion: 4+/5, abduction: 4+/5, elbow flexion: 5/5 , extension: 4+/5 wrist extension: 3-/5 Goal status: Ongoing  11. Pt. will  improve right wrist extension by 10 degrees to initiate reaching for items in preparation for ADLs. Baseline:03/18/24:  Right wrist extension: -36 Right wrist extension: -38(-6) 01/08/24: Continue, carry goal over from recertification update last session.  12/27/23: Right wrist extension: -38(-6) 10/23/23: -30(-8) 08/23/2023: R Wrist Ext: -31(-8)  08/07/23: Continue 05/31/23: Continue 05/10/23: Right wrist extension: -32(22) 03/13/23: -22(35) 03/08/23: NT this date as pt arrived late; 02/13/2023: Continue 01/09/2023: Continue 12/28/2022: -22(34) 11/16/2022: -30(10)    Goal status: Ongoing      12. Pt. Will require donn/doff a Tank top T-shirt efficiently with supervision and full set-up.    Baseline: 03/18/24: Independent doffing, Pt. Reports assist is required for pulling the T-shirt Tank down in the back.01/08/24: Continue, carry goal over from recertification update last session. 12/27/23: Independent doffing, MaxA doffing 10/23/23: Independent doff the shirt/tank, MaxA  donning. 10/23/23: Pt. Is able to independently doff his shirt, requiring assist to lean forward and loosen the back. 08/07/23: Continue 05/31/2023: Pt. Requires maxA donning a pullover sweatshirt, Mod-maxA doffing it. T-shirt tank TBD 05/10/23: Pt. Presents with difficulty completing, and requires increased time to complete    Goal status: Ongoing    13. Pt. Will independently perform bilateral/bimanual hand tasks needed to fold towels/linens/laundry Baseline: 03/18/24: Continue goal 01/08/24: Continue, carry goal over from recertification update last session. 12/27/23: Pt. Has not been able to work on the this  2/2 multiple cancellations this recertification 2/2 illness, familiy health issues,and transportation. 10/23/23: Pt. Continues to have difficulty folding laundry, and linens. 08/23/23: Pt. Continues to have difficulty using bilateral/bimanual hand tasks needed to fold towels/linens/laundry. 08/07/23: Continue 05/31/23: Continue 05/10/23:  Pt. Has  difficulty folding towels/lines/laundry using bilateral hands Goal status:Ongoing  14. Pt. Will independently, and efficiently reach his right arm over the side of the chair to pet his dog.    Baseline: 08/23/2023: Pt. Is now able to reach over the side of the armrest of his recliner chair to pet his dog.  08/07/23: Continue  05/31/2023: Continue 05/10/23: Pt. is unable to efficiently reach his right UE over the side of his recliner chair to pet his dog.    Goal status: Achieved    15. Pt. Will independently, and efficiently reach out in front of him to efficiently place items onto his table while sitting in his recliner.     Baseline: 03/28/24:  Pt. is able to reach out to efficiently reach out in front of him to place items on the table with the left hand, continue to work towards efficiency, as well as using the RUE. 01/08/24: Continue, carry goal over from recertification update last session. 12/27/2023: Pt. is able to reach out to efficiently reach out in front of him to place items on the table 90% of the time with the LUE, 10% of the time with the RUE 2/2  increased flexor tone. 10/23/23: Pt. Is improving with functional reaching with the left UE out in front of him to place items onto the table. Pt. has difficulty reaching out with the right hand to  place items on the table. 08/23/2023: Pt. has difficulty consistently reaching out in front of him to place items on the on a tabletop surface with the right hand. 08/07/23: Continue 2/26/225: Continue 05/10/23: Pt. has difficulty reaching out in front of him far enough to efficiently place items on the table while sitting in a recliner chair.    Goal status: Continue/Ongoing     16. Pt. Will improve bilateral hand supination by 10 degrees to be able actively turn items in his hand to look at them independently and efficiently during ADLs/IADLs,    Baseline: 03/28/24:  Right: Supination is limited by increased tone, and tightness; Left: 26(38) 01/08/24:  Continue, carry goal over from recertification update last session. 12/27/23: Right: Unable 2/2 increased tightness  Left: supination 20(38) 10/23/23: Right: unable, L: supination 30(38)    Goal status: Continue/Ongoing      17. Pt. Will independently, and efficiently transition between picking up finger foods for dipping, and  picking up utensils , then placing them down on the tabletop surface through 75% of the meal with minimal cues.    Baseline: 03/18/24: Continue, carry goal over from recertification update last session. 12/27/23: Pt. is able to consistently transition between picking up finger foods for dipping, and utensils consistently with the left hand with 100% accuracy, and continues to work on  accuracy with the right hand 2/2 limitations from flexor tone, and tightness prior to Botox.  01/08/24: Continue, carry goal over from recertification update last session. 12/27/23: Pt. is able to consistently transition between picking up finger foods for dipping, and utensils consistently with the left hand with 100% accuracy time, and the right hand with 5% accuracy. 10/23/23: Pt. requires heavy cues, and assist to grasp finger foods, and utensils, and transition between the two.    Goal status: Continue/Ongoing      18. Pt. Will independently transfer small ADL items from one hand to the other efficiently.    Baseline: 03/18/24:  Pt. Requires increased time to transfer items between his hands. Pt. Is able to transfer 1.5 discs between his hands with 60% accuracy    Goal status: New      19. Pt will independently perform controlled hand to face patterns using bilateral hands symmetrically, and efficiently in preparation for being able to intiate donning, and doffing glasses safely.    Baseline: 03/18/24: Pt. Has difficulty using bilateral hand symmetrically, and efficiently for controlled hand to  face patterns of movement.    Goal status: New    ASSESSMENT:  CLINICAL IMPRESSION:  Pt. continues   to tolerate the bilateral hand exercises well without reports of pain or discomfort. Pt. presents with increased ROM 2/2 decreased tightness. Pt./caregiver Reports the cold weather contributes to tightness. Pt. responds well the moist heat. The folding task was modified and graded from full size towel to wash cloth size linens. Pt. required fewer cues, and assist/support to incorporate his right hand during the folding task using bilateral hands.  Pt. Was able to fold 3/3 washcloths into quarters increasing accuracy with aligning corners  with each one completed. Pt.'s caregivers continue to be very supportive to continue working towards improving BUE functioning, ROM, strength, motor control, [facility] skills in order to maximize independence with daily ADLs, and IADL functioning. Plan to continue with the same goals into the next recertification period.   PERFORMANCE DEFICITS in functional skills including ADLs, IADLs, coordination, dexterity, proprioception, ROM, strength, pain, FMC, GMC, decreased knowledge of use of DME, and UE functional use, cognitive skills including safety awareness, and psychosocial skills including coping strategies, environmental adaptation, habits, and routines and behaviors.   IMPAIRMENTS are limiting patient from ADLs, IADLs, leisure, and social participation.   COMORBIDITIES may have co-morbidities  that affects occupational performance. Patient will benefit from skilled OT to address above impairments and improve overall function.  MODIFICATION OR ASSISTANCE TO COMPLETE EVALUATION: Maximum or significant modification of tasks or assist is necessary to complete an evaluation.  OT OCCUPATIONAL PROFILE AND HISTORY: Comprehensive assessment: Review of records and extensive additional review of physical, cognitive, psychosocial history related to current functional performance.  CLINICAL DECISION MAKING: High - multiple treatment options, significant modification of task  necessary  REHAB POTENTIAL: Fair    EVALUATION COMPLEXITY: High    PLAN: OT FREQUENCY: 2 x's a week  OT DURATION:12 weeks  PLANNED INTERVENTIONS: self care/ADL training, therapeutic exercise, therapeutic activity, neuromuscular re-education, manual therapy, passive range of motion, patient/family education, and cognitive remediation/compensation RECOMMENDED OTHER SERVICES: PT  CONSULTED AND AGREED WITH PLAN OF CARE: Patient and family member/caregiver  PLAN FOR NEXT SESSION: Review HEP   Richardson Otter, MS, OTR/L  04/03/24

## 2024-04-03 NOTE — Therapy (Signed)
 " OUTPATIENT PHYSICAL THERAPY NEURO TREATMENT  Patient Name: Paul Hall MRN: [id number] DOB:1995-08-16, 28 y.o., male Today's Date: 04/03/2024   PCP:  Morene Sous, PA-C  REFERRING PROVIDER: Morene Sous, PA-C  END OF SESSION:  PT End of Session - 04/03/24 1722     Visit Number 188    Number of Visits 211   date corrected from most recent recert   Date for Recertification  06/25/24    Authorization Type [employer]/ [place] [employer]    Progress Note Due on Visit 190    Equipment Utilized During Treatment Gait belt    Activity Tolerance Patient tolerated treatment well    Behavior During Therapy WFL for tasks assessed/performed                 Past Medical History:  Diagnosis Date   Diabetes mellitus (HCC)    Hypertension    Stroke [facility])    Past Surgical History:  Procedure Laterality Date   IVC FILTER PLACEMENT ([facility] HX)     LEG SURGERY     PEG TUBE PLACEMENT     TRACHEOSTOMY     Patient Active Problem List   Diagnosis Date Noted   Sepsis due to vancomycin  resistant Enterococcus species (HCC) 06/06/2019   SIRS (systemic inflammatory response syndrome) (HCC) 06/05/2019   Acute lower UTI 06/05/2019   VRE (vancomycin -resistant Enterococci) infection 06/05/2019   Anemia 06/05/2019   Skin ulcer of sacrum with necrosis of muscle (HCC)    Urinary retention    Type 2 diabetes mellitus without complication, with long-term current use of insulin  (HCC)    Tachycardia    Lower extremity edema    Acute metabolic encephalopathy    Obstructive sleep apnea    Morbid obesity with BMI of 60.0-69.9, adult (HCC)    Goals of care, counseling/discussion    Palliative care encounter    Sepsis (HCC) 04/27/2019   H/O insulin  dependent diabetes mellitus 04/27/2019   History of CVA with residual deficit 04/27/2019   Seizure disorder (HCC) 04/27/2019   Decubitus ulcer of sacral region, stage 4 (HCC) 04/27/2019    ONSET DATE: October 2020  REFERRING DIAG:  Cerebral infarction, unspecified   THERAPY DIAG:  Muscle weakness (generalized)  Difficulty in walking, not elsewhere classified  Other lack of coordination  History of CVA with residual deficit  Unsteadiness on feet  Other abnormalities of gait and mobility  Abnormality of gait and mobility  Low vision, both eyes  Rationale for Evaluation and Treatment: Rehabilitation  SUBJECTIVE:  SUBJECTIVE STATEMENT:  Patient reports doing well overall w/o report of pain or falls.   Pt accompanied by: family member- Mother  PERTINENT HISTORY: Paul Hall is a 28 y/o male who presents with severe weakness, quadriparesis, altered sensorium, and visual impairment s/p critical illness and prolonged hospitalization. Pt hospitalized in October 2020 with ARDS 2/2 COVID19 infection. Pt sustained a complex and lengthy hospitalization which included tracheostomy, prolonged sedation, ECMO. In this period pt sustained CVA and SDH. Pt has now been liberated from tracheostomy and G-tube. Pt has since been hospitalized for wound infection and UTI. Pt lives with parents at home, has hospital bed and left chair, hoyer lift transfers, and power WC for mobility needs. Pt needs heavy physical assistance with ADL 2/2 BUE contractures and motor dysfunction   PAIN:  Are you having pain? No  PRECAUTIONS: Fall  RED FLAGS: None   WEIGHT BEARING RESTRICTIONS: No  FALLS: Has patient fallen in last 6 months? No  LIVING ENVIRONMENT: Lives with: lives with their family Lives in: House/apartment Stairs:  Has following equipment at home: Wheelchair (power) and hospital bed, hoyer lift, Sit to stand lift  PLOF: Independent  PATIENT GOALS: I want to walk  OBJECTIVE:  Note: Objective measures were completed at Evaluation  unless otherwise noted.  DIAGNOSTIC FINDINGS: CLINICAL DATA:  Altered mental status.  History of COVID and stroke.   EXAM: CT HEAD WITHOUT CONTRAST   TECHNIQUE: Contiguous axial images were obtained from the base of the skull through the vertex without intravenous contrast.   COMPARISON:  CT head 11/11/2012   FINDINGS: Brain: Left cerebral convexity cortical hypodensity with associated cortical calcification extending from the frontal to the parietal lobe. Extensive area of volume loss and low-density compatible with chronic infarction. This is noted on prior reports from [facility]. Probable chronic watershed infarct. Additional small areas of hypodensity in the right frontal white matter consistent with chronic ischemia.   Negative for acute infarct. No acute hemorrhage or mass. Ventricle size normal without midline shift.   Vascular: Normal arterial flow voids.   Skull: Negative   Sinuses/Orbits: Mucosal edema paranasal sinuses with air-fluid level left sphenoid sinus. Negative orbit   Other: None   IMPRESSION: Extensive watershed type infarct over the left cerebral convexity with cortical calcification. This appears chronic. No associated hemorrhage, hydrocephalus, or midline shift.   These results were called by telephone at the time of interpretation on 05/09/2019 at 3:12 pm to provider [name] , who verbally acknowledged these results.     Electronically Signed   By: [name] M.D.   On: 05/09/2019 15:13  COGNITION: Overall cognitive status: Impaired   SENSATION: Light touch: WFL  COORDINATION: Delayed with BLE  EDEMA:  None observed  MUSCLE TONE: RLE: Moderate  POSTURE: rounded shoulders and forward head  LOWER EXTREMITY ROM:     Active  Right Eval Left Eval  Hip flexion    Hip extension    Hip abduction    Hip adduction    Hip internal rotation    Hip external rotation    Knee flexion    Knee extension    Ankle dorsiflexion     Ankle plantarflexion    Ankle inversion    Ankle eversion     (Blank rows = not tested)  LOWER EXTREMITY MMT:    MMT Right Eval Left Eval  Hip flexion    Hip extension    Hip abduction    Hip adduction    Hip internal rotation  Hip external rotation    Knee flexion    Knee extension    Ankle dorsiflexion    Ankle plantarflexion    Ankle inversion    Ankle eversion    (Blank rows = not tested)  BED MOBILITY:  Findings: Rolling to Right Max A Rolling to Left Max A  TRANSFERS: Sit to stand: Max A x 2 - just able to clear gluteals off mat  Assistive device utilized: HHA x2 to stand- dependent on hoyer for actual transfers     Stand to sit: Max A  Assistive device utilized: HHA x 2        GAIT: Findings: Non-Ambulatory  FUNCTIONAL TESTS:  None appropriate at this time  PATIENT SURVEYS:                                                                                                                                TREATMENT DATE: 04/03/2024     TA:  Rolling to Right (mas assist) - 2 x 10 reps   Lower trunk rotation - min/mod assist to left and mod assist to right x 20 reps   TE: -Bridging with physical support at feet 3 x 10 - minimal gluteal clearance (better with 2nd set)     -LE on small green TB with Heel slides + hip flex and keeping knee in line (performing with only CGA) 3 x 10  each LE   Hip abd/add (hooklye) 2 x 10 reps BLE   Manual resistive leg press x 12 reps each LE -Hip abd/add with LE on theraball with min physical assist 2 x 15 reps each LE   PATIENT EDUCATION: Education details: Exercise technique; Plan for recert today Person educated: Patient and Parent Education method: Explanation Education comprehension: verbalized understanding  HOME EXERCISE PROGRAM: Access Code: [email or web address] URL: [email or web address] Date: 03/23/2022 Prepared by: Reyes London   Exercises - Supine Bridge  - 3 x weekly - 3 sets - 10 reps - 2  hold - Supine Gluteal Sets  - 3 x weekly - 3 sets - 10 reps - 5 sec hold - Supine Quad Set  - 1 x daily - 3 x weekly - 3 sets - 10 reps - 5 hold - Seated Long Arc Quad  - 1 x daily - 7 x weekly - 3 sets - 10 reps - Seated Hip Adduction Squeeze with Ball  - 1 x daily - 3 x weekly - 3 sets - 10 reps - 5 hold - Seated Hip Abduction  - 1 x daily - 3 x weekly - 3 sets - 10 reps - 2 hold  GOALS: Goals reviewed with patient? Yes  SHORT TERM GOALS: Target date: 02/05/2024  Patient will be independent with updated HEP for improved overall LE strength in home Baseline: HEP continues to be updated as appropriate. 04/02/2024 - HEP continues to evolve as patient makes functional strength gains. He is compliant mostly with current HEP  unless sick Goal status: PROGRESSING  LONG TERM GOALS: Target date: 06/25/2024 -Goal were updated/revised secondary to length of chart on last cert.   Patient will perform sit to stand transfer with appropriate AD and max assist of 2 people with 75% consistency to prepare for pregait activities.  Baseline: 09/04/2023- Patient performed several Sit to stand requiring max A+2  with improving posture- able to ext R knee better - near terminal knee and able to clear bottom off mat- still not full but able to contract glutes for standing as erect as possible. 04/01/2024- Did not attempt this visit secondary to assessing other goals but goal still appropriate and will be a focus during new cert. Goal status: ONGOING  2.  Patient will tolerate 2 minutes or more of standing in sit to stand lift or with max assist +2 in order to indicate improved lower extremity weightbearing tolerance for progression to standing in parallel bars  Baseline:  09/04/2023- Patient performed 3 trials of standing ranging from 17 -47 sec - improving right knee terminal knee ext and more activation upon command with gluteals- able to clear mat table. Continued to require Max A+2 and blocking knees with  forearm/trunk support.  04/01/2024- Did not attempt this visit secondary to assessing other goals but goal still appropriate and will be a focus during new cert. Goal status: ONGOING  3.  Patient will perform rolling left to right with Mod assist for improved independence with bed mobility to assist with dressing and positioning in bed.  Baseline: Dependent on caregiver to roll to left; 12/27/2023= Mod to max assist each direction (difficult due to RUE tone). 04/01/2024- Patient able to roll to left with Mod assist today and still max to right- reviewed all techniques for Mother to practice with patient.  Goal status: Ongoing  4.  Patient will perform active bridging -able to clear bottom   Off mat for 10 reps with PT/caregiver holding his feet for improved trunk ext strength with standing and to assist with mobility/positioning.  Baseline: Unable to perform 10 reps with consistent clearing of bottom; 12/27/2023 = Patient able to minimally clear his glues off mat table - but successfully 10 reps today.  Goal status: MET  5. Patient will perform active bridging -able to clear bottom   Off mat for 3 sets of 10 reps with PT/caregiver holding his feet for improved trunk ext strength with standing and to assist with mobility/positioning.  Baseline: 12/27/2023- Patient able to complete 10 reps with max effort; 04/01/2024- Patient was able to perform 2 sets of 10 reps with minimal gluteal clearing mat.   Goal status: PROGRESSING     ASSESSMENT:  CLINICAL IMPRESSION:  Patient continued to work on his LE strength and bed mobility today. He arrived with good motivation and performed well- Still struggles to roll toward affected Right side. He did perform well overall with more dynamic activities using theraball - able to keep his LE on ball with little physical assist. Pt will continue to benefit from skilled physical therapy intervention to address impairments, improve QOL, and attain therapy goals.    OBJECTIVE IMPAIRMENTS: Abnormal gait, cardiopulmonary status limiting activity, decreased activity tolerance, decreased balance, decreased cognition, decreased coordination, decreased endurance, decreased knowledge of condition, decreased knowledge of use of DME, decreased mobility, difficulty walking, decreased ROM, decreased strength, hypomobility, impaired flexibility, impaired sensation, impaired UE functional use, impaired vision/preception, postural dysfunction, obesity, and pain.   ACTIVITY LIMITATIONS: carrying, lifting, bending, sitting, standing, squatting, stairs, transfers, bed mobility,  bathing, toileting, dressing, self feeding, reach over head, and hygiene/grooming  PARTICIPATION LIMITATIONS: meal prep, cleaning, laundry, medication management, personal finances, interpersonal relationship, driving, shopping, community activity, occupation, yard work, and church  PERSONAL FACTORS: Time since onset of injury/illness/exacerbation and 3+ comorbidities: DM, CVA, Seizures are also affecting patient's functional outcome.   REHAB POTENTIAL: Good  CLINICAL DECISION MAKING: Evolving/moderate complexity  EVALUATION COMPLEXITY: Moderate  PLAN:  PT FREQUENCY: 1-2x/week  PT DURATION: 12 weeks  PLANNED INTERVENTIONS: 97110-Therapeutic exercises, 97530- Therapeutic activity, 97112- Neuromuscular re-education, 97535- Self Care, [id number]- Manual therapy, and Patient/Family education  PLAN FOR NEXT SESSION:  Continue with Standing frame Resume standing with +2 as able  Nustep for UE/LE strengthening Mat table dynamic sitting activities.    Reyes LOISE London, PT 04/03/2024, 5:23 PM        "

## 2024-04-08 ENCOUNTER — Ambulatory Visit

## 2024-04-08 ENCOUNTER — Ambulatory Visit: Attending: Physician Assistant | Admitting: Occupational Therapy

## 2024-04-08 DIAGNOSIS — R2689 Other abnormalities of gait and mobility: Secondary | ICD-10-CM | POA: Insufficient documentation

## 2024-04-08 DIAGNOSIS — R269 Unspecified abnormalities of gait and mobility: Secondary | ICD-10-CM

## 2024-04-08 DIAGNOSIS — H543 Unqualified visual loss, both eyes: Secondary | ICD-10-CM | POA: Diagnosis present

## 2024-04-08 DIAGNOSIS — R262 Difficulty in walking, not elsewhere classified: Secondary | ICD-10-CM | POA: Diagnosis present

## 2024-04-08 DIAGNOSIS — R2681 Unsteadiness on feet: Secondary | ICD-10-CM

## 2024-04-08 DIAGNOSIS — R278 Other lack of coordination: Secondary | ICD-10-CM | POA: Diagnosis present

## 2024-04-08 DIAGNOSIS — I6930 Unspecified sequelae of cerebral infarction: Secondary | ICD-10-CM | POA: Diagnosis present

## 2024-04-08 DIAGNOSIS — M6281 Muscle weakness (generalized): Secondary | ICD-10-CM | POA: Insufficient documentation

## 2024-04-08 NOTE — Therapy (Signed)
 " OUTPATIENT PHYSICAL THERAPY NEURO TREATMENT  Patient Name: Paul Hall MRN: [id number] DOB:Nov 21, 1995, 29 y.o., male Today's Date: 04/09/2024   PCP:  Morene Sous, PA-C  REFERRING PROVIDER: Morene Sous, PA-C  END OF SESSION:  PT End of Session - 04/08/24 1619     Visit Number 189    Number of Visits 211   date corrected from most recent recert   Date for Recertification  06/25/24    Authorization Type [employer] COMM [employer]/ [place] [employer]    Progress Note Due on Visit 190    PT Start Time 1620    PT Stop Time 1700    PT Time Calculation (min) 40 min    Equipment Utilized During Treatment Gait belt    Activity Tolerance Patient tolerated treatment well    Behavior During Therapy WFL for tasks assessed/performed                 Past Medical History:  Diagnosis Date   Diabetes mellitus (HCC)    Hypertension    Stroke [facility])    Past Surgical History:  Procedure Laterality Date   IVC FILTER PLACEMENT ([facility] HX)     LEG SURGERY     PEG TUBE PLACEMENT     TRACHEOSTOMY     Patient Active Problem List   Diagnosis Date Noted   Sepsis due to vancomycin  resistant Enterococcus species (HCC) 06/06/2019   SIRS (systemic inflammatory response syndrome) (HCC) 06/05/2019   Acute lower UTI 06/05/2019   VRE (vancomycin -resistant Enterococci) infection 06/05/2019   Anemia 06/05/2019   Skin ulcer of sacrum with necrosis of muscle (HCC)    Urinary retention    Type 2 diabetes mellitus without complication, with long-term current use of insulin  (HCC)    Tachycardia    Lower extremity edema    Acute metabolic encephalopathy    Obstructive sleep apnea    Morbid obesity with BMI of 60.0-69.9, adult (HCC)    Goals of care, counseling/discussion    Palliative care encounter    Sepsis (HCC) 04/27/2019   H/O insulin  dependent diabetes mellitus 04/27/2019   History of CVA with residual deficit 04/27/2019   Seizure disorder (HCC) 04/27/2019   Decubitus ulcer of sacral region,  stage 4 (HCC) 04/27/2019    ONSET DATE: October 2020  REFERRING DIAG: Cerebral infarction, unspecified   THERAPY DIAG:  Muscle weakness (generalized)  Difficulty in walking, not elsewhere classified  Other lack of coordination  History of CVA with residual deficit  Unsteadiness on feet  Other abnormalities of gait and mobility  Abnormality of gait and mobility  Low vision, both eyes  Rationale for Evaluation and Treatment: Rehabilitation  SUBJECTIVE:  SUBJECTIVE STATEMENT:  Patient reports doing well overall w/o report of pain or falls.   Pt accompanied by: family member- Mother  PERTINENT HISTORY: Paul Hall is a 29 y/o male who presents with severe weakness, quadriparesis, altered sensorium, and visual impairment s/p critical illness and prolonged hospitalization. Pt hospitalized in October 2020 with ARDS 2/2 COVID19 infection. Pt sustained a complex and lengthy hospitalization which included tracheostomy, prolonged sedation, ECMO. In this period pt sustained CVA and SDH. Pt has now been liberated from tracheostomy and G-tube. Pt has since been hospitalized for wound infection and UTI. Pt lives with parents at home, has hospital bed and left chair, hoyer lift transfers, and power WC for mobility needs. Pt needs heavy physical assistance with ADL 2/2 BUE contractures and motor dysfunction   PAIN:  Are you having pain? No  PRECAUTIONS: Fall  RED FLAGS: None   WEIGHT BEARING RESTRICTIONS: No  FALLS: Has patient fallen in last 6 months? No  LIVING ENVIRONMENT: Lives with: lives with their family Lives in: House/apartment Stairs:  Has following equipment at home: Wheelchair (power) and hospital bed, hoyer lift, Sit to stand lift  PLOF: Independent  PATIENT GOALS: I want to  walk  OBJECTIVE:  Note: Objective measures were completed at Evaluation unless otherwise noted.  DIAGNOSTIC FINDINGS: CLINICAL DATA:  Altered mental status.  History of COVID and stroke.   EXAM: CT HEAD WITHOUT CONTRAST   TECHNIQUE: Contiguous axial images were obtained from the base of the skull through the vertex without intravenous contrast.   COMPARISON:  CT head 11/11/2012   FINDINGS: Brain: Left cerebral convexity cortical hypodensity with associated cortical calcification extending from the frontal to the parietal lobe. Extensive area of volume loss and low-density compatible with chronic infarction. This is noted on prior reports from [facility]. Probable chronic watershed infarct. Additional small areas of hypodensity in the right frontal white matter consistent with chronic ischemia.   Negative for acute infarct. No acute hemorrhage or mass. Ventricle size normal without midline shift.   Vascular: Normal arterial flow voids.   Skull: Negative   Sinuses/Orbits: Mucosal edema paranasal sinuses with air-fluid level left sphenoid sinus. Negative orbit   Other: None   IMPRESSION: Extensive watershed type infarct over the left cerebral convexity with cortical calcification. This appears chronic. No associated hemorrhage, hydrocephalus, or midline shift.   These results were called by telephone at the time of interpretation on 05/09/2019 at 3:12 pm to provider [name] , who verbally acknowledged these results.     Electronically Signed   By: [name] M.D.   On: 05/09/2019 15:13  COGNITION: Overall cognitive status: Impaired   SENSATION: Light touch: WFL  COORDINATION: Delayed with BLE  EDEMA:  None observed  MUSCLE TONE: RLE: Moderate  POSTURE: rounded shoulders and forward head  LOWER EXTREMITY ROM:     Active  Right Eval Left Eval  Hip flexion    Hip extension    Hip abduction    Hip adduction    Hip internal rotation    Hip  external rotation    Knee flexion    Knee extension    Ankle dorsiflexion    Ankle plantarflexion    Ankle inversion    Ankle eversion     (Blank rows = not tested)  LOWER EXTREMITY MMT:    MMT Right Eval Left Eval  Hip flexion    Hip extension    Hip abduction    Hip adduction    Hip internal rotation  Hip external rotation    Knee flexion    Knee extension    Ankle dorsiflexion    Ankle plantarflexion    Ankle inversion    Ankle eversion    (Blank rows = not tested)  BED MOBILITY:  Findings: Rolling to Right Max A Rolling to Left Max A  TRANSFERS: Sit to stand: Max A x 2 - just able to clear gluteals off mat  Assistive device utilized: HHA x2 to stand- dependent on hoyer for actual transfers     Stand to sit: Max A  Assistive device utilized: HHA x 2        GAIT: Findings: Non-Ambulatory  FUNCTIONAL TESTS:  None appropriate at this time  PATIENT SURVEYS:                                                                                                                                TREATMENT DATE: 04/08/2024    TA:  Trunk dissociation- Upper trunk rolling left while rolling BLE to right 2 x 10  Lower trunk rotation- Assist only to keep feet in place- 2 x 10 reps  Rolling to Right (mas assist) - 2 x 10 reps  Rolling to right then applied wedges (brought in by patient's mom) and practice laying on right side with wedge x 10 min Dying bug activity- Raising 1 UE while performing knee to chest with opp LE 2 x 10 reps            PATIENT EDUCATION: Education details: Exercise technique; Plan for recert today Person educated: Patient and Parent Education method: Explanation Education comprehension: verbalized understanding  HOME EXERCISE PROGRAM: Access Code: [email or web address] URL: [email or web address] Date: 03/23/2022 Prepared by: Reyes London   Exercises - Supine Bridge  - 3 x weekly - 3 sets - 10 reps - 2 hold - Supine Gluteal Sets   - 3 x weekly - 3 sets - 10 reps - 5 sec hold - Supine Quad Set  - 1 x daily - 3 x weekly - 3 sets - 10 reps - 5 hold - Seated Long Arc Quad  - 1 x daily - 7 x weekly - 3 sets - 10 reps - Seated Hip Adduction Squeeze with Ball  - 1 x daily - 3 x weekly - 3 sets - 10 reps - 5 hold - Seated Hip Abduction  - 1 x daily - 3 x weekly - 3 sets - 10 reps - 2 hold  GOALS: Goals reviewed with patient? Yes  SHORT TERM GOALS: Target date: 02/05/2024  Patient will be independent with updated HEP for improved overall LE strength in home Baseline: HEP continues to be updated as appropriate. 04/02/2024 - HEP continues to evolve as patient makes functional strength gains. He is compliant mostly with current HEP unless sick Goal status: PROGRESSING  LONG TERM GOALS: Target date: 06/25/2024 -Goal were updated/revised secondary to length of chart on last cert.   Patient  will perform sit to stand transfer with appropriate AD and max assist of 2 people with 75% consistency to prepare for pregait activities.  Baseline: 09/04/2023- Patient performed several Sit to stand requiring max A+2  with improving posture- able to ext R knee better - near terminal knee and able to clear bottom off mat- still not full but able to contract glutes for standing as erect as possible. 04/01/2024- Did not attempt this visit secondary to assessing other goals but goal still appropriate and will be a focus during new cert. Goal status: ONGOING  2.  Patient will tolerate 2 minutes or more of standing in sit to stand lift or with max assist +2 in order to indicate improved lower extremity weightbearing tolerance for progression to standing in parallel bars  Baseline:  09/04/2023- Patient performed 3 trials of standing ranging from 17 -47 sec - improving right knee terminal knee ext and more activation upon command with gluteals- able to clear mat table. Continued to require Max A+2 and blocking knees with forearm/trunk support.  04/01/2024- Did  not attempt this visit secondary to assessing other goals but goal still appropriate and will be a focus during new cert. Goal status: ONGOING  3.  Patient will perform rolling left to right with Mod assist for improved independence with bed mobility to assist with dressing and positioning in bed.  Baseline: Dependent on caregiver to roll to left; 12/27/2023= Mod to max assist each direction (difficult due to RUE tone). 04/01/2024- Patient able to roll to left with Mod assist today and still max to right- reviewed all techniques for Mother to practice with patient.  Goal status: Ongoing  4.  Patient will perform active bridging -able to clear bottom   Off mat for 10 reps with PT/caregiver holding his feet for improved trunk ext strength with standing and to assist with mobility/positioning.  Baseline: Unable to perform 10 reps with consistent clearing of bottom; 12/27/2023 = Patient able to minimally clear his glues off mat table - but successfully 10 reps today.  Goal status: MET  5. Patient will perform active bridging -able to clear bottom   Off mat for 3 sets of 10 reps with PT/caregiver holding his feet for improved trunk ext strength with standing and to assist with mobility/positioning.  Baseline: 12/27/2023- Patient able to complete 10 reps with max effort; 04/01/2024- Patient was able to perform 2 sets of 10 reps with minimal gluteal clearing mat.   Goal status: PROGRESSING     ASSESSMENT:  CLINICAL IMPRESSION:  Patient performed well- embraced the new activities and was even able to lay some with more weight on his right side for a short period of time. He still requires max assist to roll toward right side and will benefit from continued training to improve his functional independence. Much improved ability to lift his RLE today and all together good job working opp LE/UE. Pt will continue to benefit from skilled physical therapy intervention to address impairments, improve QOL, and  attain therapy goals.   OBJECTIVE IMPAIRMENTS: Abnormal gait, cardiopulmonary status limiting activity, decreased activity tolerance, decreased balance, decreased cognition, decreased coordination, decreased endurance, decreased knowledge of condition, decreased knowledge of use of DME, decreased mobility, difficulty walking, decreased ROM, decreased strength, hypomobility, impaired flexibility, impaired sensation, impaired UE functional use, impaired vision/preception, postural dysfunction, obesity, and pain.   ACTIVITY LIMITATIONS: carrying, lifting, bending, sitting, standing, squatting, stairs, transfers, bed mobility, bathing, toileting, dressing, self feeding, reach over head, and hygiene/grooming  PARTICIPATION LIMITATIONS:  meal prep, cleaning, laundry, medication management, personal finances, interpersonal relationship, driving, shopping, community activity, occupation, yard work, and church  PERSONAL FACTORS: Time since onset of injury/illness/exacerbation and 3+ comorbidities: DM, CVA, Seizures are also affecting patient's functional outcome.   REHAB POTENTIAL: Good  CLINICAL DECISION MAKING: Evolving/moderate complexity  EVALUATION COMPLEXITY: Moderate  PLAN:  PT FREQUENCY: 1-2x/week  PT DURATION: 12 weeks  PLANNED INTERVENTIONS: 97110-Therapeutic exercises, 97530- Therapeutic activity, 97112- Neuromuscular re-education, 97535- Self Care, [id number]- Manual therapy, and Patient/Family education  PLAN FOR NEXT SESSION:  Continue with Standing frame Resume standing with +2 as able  Nustep for UE/LE strengthening Mat table dynamic sitting activities.    Reyes LOISE London, PT 04/09/2024, 11:08 AM        "

## 2024-04-08 NOTE — Therapy (Signed)
 . Occupational Therapy Neuro Treatment Note    Patient Name: Paul Hall MRN: [id number] DOB:16-Jan-1996, 29 y.o., male Today's Date: 04/08/2024  PCP: Dr. Bertell REFERRING PROVIDER: Dr. Bertell   .  OT End of Session - 04/08/24 1723     Visit Number [id number]    Number of Visits 204    Date for Recertification  06/10/24    OT Start Time 1545    OT Stop Time 1615    OT Time Calculation (min) 30 min    Equipment Utilized During Treatment tilt in space power wc    Activity Tolerance Patient tolerated treatment well    Behavior During Therapy WFL for tasks assessed/performed            Past Medical History:  Diagnosis Date   Diabetes mellitus (HCC)    Hypertension    Stroke [facility])    Past Surgical History:  Procedure Laterality Date   IVC FILTER PLACEMENT ([facility] HX)     LEG SURGERY     PEG TUBE PLACEMENT     TRACHEOSTOMY     Patient Active Problem List   Diagnosis Date Noted   Sepsis due to vancomycin  resistant Enterococcus species (HCC) 06/06/2019   SIRS (systemic inflammatory response syndrome) (HCC) 06/05/2019   Acute lower UTI 06/05/2019   VRE (vancomycin -resistant Enterococci) infection 06/05/2019   Anemia 06/05/2019   Skin ulcer of sacrum with necrosis of muscle (HCC)    Urinary retention    Type 2 diabetes mellitus without complication, with long-term current use of insulin  (HCC)    Tachycardia    Lower extremity edema    Acute metabolic encephalopathy    Obstructive sleep apnea    Morbid obesity with BMI of 60.0-69.9, adult (HCC)    Goals of care, counseling/discussion    Palliative care encounter    Sepsis (HCC) 04/27/2019   H/O insulin  dependent diabetes mellitus 04/27/2019   History of CVA with residual deficit 04/27/2019   Seizure disorder (HCC) 04/27/2019   Decubitus ulcer of sacral region, stage 4 (HCC) 04/27/2019   ONSET DATE: 01/2019  REFERRING DIAG: CVA/COVID-19  THERAPY DIAG:  Muscle weakness (generalized)  Rationale for Evaluation  and Treatment Rehabilitation  SUBJECTIVE:    SUBJECTIVE STATEMENT:   Pt. Reports having had a nice New Year.   Pt accompanied by: self and family member  PERTINENT HISTORY:  Pt. is a 29 y.o. male who was diagnosed with COVID-19, and CVA with resultant quadriplegia. Pt. Was then hospitalized with VRE UTI. PMHx includes: urinary retention, seizure disorder, obstructive sleep disorder, DM Type II, Morbid obesity.   PRECAUTIONS: None  WEIGHT BEARING RESTRICTIONS No  PAIN:   03/06/24: No pain  FALLS: Has patient fallen in last 6 months? No  LIVING ENVIRONMENT: Lives with: lives with their family Lives in: House/apartment Stairs: No Level Entry Has following equipment at home: Wheelchair (power) and hoyer lift, sit to stand lift  PLOF: Independent  PATIENT GOALS: To be able to engage in more daily care tasks.  OBJECTIVE:   HAND DOMINANCE: Right  ADLs:  Transfers/ambulation related to ADLs: Eating: Pt. reports being able to hold standard utensils, and is starting to engage more in self-feeding tasks, hand to mouth patterns. Pt. Reports that he does as much as he can with the task, and family assists  with the remainder of the task. Grooming: Pt. is able to initiate holding an electric toothbrush, and brush his teeth. Family assists LB Dressing: Total Assist UE dressing: Pt. is now able  to reach up to actively assist with grasping , and pulling his gown down. Toileting:  Total Assist Bathing: MaxA UB, Total assist LB Tub Shower transfers: N/A Equipment: See above  IADLs: Shopping: Relies on family to assist Light housekeeping: Total Assist Meal Prep: Total Assist Community mobility:   Medication management:  Total Assist  Financial management: N/A Handwriting: Not legible: Pt. Is able to hold a pen with the left hand, and initiate marking the page. Pt.'s eye glasses were not available  MOBILITY STATUS:  Power w/c  POSTURE COMMENTS:  Pt. Requires position changes in  his power w/c  ACTIVITY TOLERANCE: Activity tolerance:  Fair  FUNCTIONAL OUTCOME MEASURES: FOTO: 40 TR score: 45  05/25/2022:   FOTO: 50 TR score: 45  07/20/22: FOTO 55  03/08/23: 51  UPPER EXTREMITY ROM     Active ROM Left eval Left  03/21/2022 Left 05/25/2022 L  07/20/22 Left 07/25/2022 Left  08/15/2022 Left  09/26/2022 Left  11/16/2022 Left 12/28/2022 Left 03/13/23 Left 05/10/23 Left 10/23/23 Left 12/27/23 Left 03/18/24  Shoulder flexion 119 123 125 130  136 138 130 142 144 144 148 148 150  Shoulder abduction 110 115 115 115  118 121 125 142 140 140 140 140 145  Shoulder adduction                Shoulder extension                Shoulder internal rotation                Shoulder external rotation                Elbow flexion 135(135) 145 145 145  145    145 145  145 146  Elbow extension -27(-18) -21(-20) -20(-16)  -25(-10) -23(-10) -21(-10) -20(-18) -20(-18) -20(-17) -17(-16) -12(-8) -12(-6) -8(-4)  Wrist flexion                Wrist extension 10(50) 19(50) 28(50)  30(50) 35(55) 36(55) 36(60) 40(60) 45(60) 45(60) 42(54) 36(54) 48(58)  Wrist ulnar deviation                Wrist radial deviation                Wrist pronation            60(74) 84(84) 90(90)  Wrist supination   Limited by flexor tone       10(15)  30(38) 20(38) 26(38)  (Blank rows = not tested)    Active ROM Right eval Right 03/21/2022 Right 05/25/2022 R  07/20/22 Right  07/25/22 Right 08/15/2022 Right 09/26/2022 Right 11/16/2022 Right 12/28/2022 Right 03/13/23 Right 05/10/23 Right 10/23/23 Right  9/24 /25 Right 03/18/24  Shoulder flexion 106 scaption 105  Scaption 62 flexion 110 scaption 100  105 105 105 112 Scaption 123 Scaption 122  Scaption 142 Scaption 142 144  Shoulder abduction 114 90 97 100  105 117 120 124 124 122 144 146 140  Shoulder adduction                Shoulder extension                Shoulder internal rotation                Shoulder external rotation                Elbow  flexion 120(130) 145 145 140  144 140 142 148 148 145  145 148  Elbow extension -45(-35) -22(-35) -28(-22)  -32(-21) -32(-28) -28(-26) -28(-28) -27(-26) -27(-40) -38(-35) -34(-26) -34(-26) -36(-25)  Wrist flexion                Wrist extension -30(10) -25(10) -10(30)  -10(20) -10(25) -20(40) -30(10) -22(34) -22(35) -32(22) -30(-8) -38(-6) -36  Wrist ulnar deviation                Wrist radial deviation                Wrist pronation                Wrist supination   Limited by flexor tone                  12/28/2022: Thumb radial abduction: Right: 12(46), Left: 40(54)  Digits: Bilateral 2nd through 5th digit: MP Hyperextension with PIP, an DIP flexion    10/23/23:  Bilateral 2nd through 5th digit MP hyperextension with PIP, and DIP flexion: Pt. has been intermittently able to initate active diigt PIP, and DIP extension following Botox during this past recertification/progress reporting period.     UPPER EXTREMITY MMT:     Left eval Left 03/21/2022 Left  05/25/2022 L 07/20/22 Left  08/15/2022 Left 09/26/2022 Left 11/16/2022 Left 12/28/2022 Left  03/13/2023 Left  05/10/23 Left 10/23/23 Left 12/27/23 Left 03/18/24  Shoulder flexion 3-/5 3/5 3+/5 4+/5 4+/5 4+/5 4+/5 4+/5 4+/5 4+/5 4+/5 4+/5 4+/5  Shoulder abduction 3-/5 3-/5 4-/5 4+/5 4+/5 4+5 4+/5 4+/5 4+/5 4+/5 4+/5 4+/5 4+/5  Shoulder adduction               Shoulder extension               Shoulder internal rotation               Shoulder external rotation               Elbow flexion 3/5 3+/5 4-/5 4+/5 5/5 5/5 5/5 5/5 5/5 5/5 5/5 5/5 5/5  Elbow extension 2-/5 2/5  4+/5 4+/5 4+/5  4/5 4/5 4/5 through available range 4+/5 through available range 4+/5 4+/5  Wrist flexion               Wrist extension 2/5 2+/5 3-/5  3-/5 3-/5 3-/5 3/5 3/5 3/5 3+/5 3-/5 3-/5  Wrist ulnar deviation               Wrist radial deviation               Wrist pronation               Wrist supination               (Blank rows = not tested)  Right eval  Right 03/21/2022 Right  05/25/2022 R 07/20/22 Right 08/15/2022 Right 09/26/2022 Right 11/16/2022 Right 12/28/2022 Right 03/13/2023 Right 05/10/23 Right 10/23/23 Right 12/27/23 Right 03/18/24  Shoulder flexion 3-/5 scaption 3-/5 3-/5 3+/5 3+/5 3+/5 3/5 3/5 3/5 3+/5 4-/5 4/5 4/5  Shoulder abduction 3-/5 3-/5 3-/5 4/5 4/5 4/5 4/5 4/5 4/5 4/-5 4-/5 4/5 4/5  Shoulder adduction               Shoulder extension               Shoulder internal rotation               Shoulder external rotation               Elbow  flexion 3/5 3/5 3+/5 4/5 4+/5 4+/5 4+/5 4+/5 4+/5 4+/5 4+/5 5/5 5/5  Elbow extension 2-/5 2/5 3-/5 4+/5 4+/5 4+/5 4+/5 4-/5 4-/5 4-/5 4-/5 through available range 3-/5 3-/5  Wrist flexion               Wrist extension 2-/5 2-/5 2/5 2/5 2/5 2/5 2/5 2/5 2/5 2/5 2/5 2-/5 2-/5  Wrist ulnar deviation               Wrist radial deviation               Wrist pronation               Wrist supination                 HAND FUNCTION:  Grip strength: Right: 0 lbs; Left: 0 lbs and Lateral pinch: Right: 5 lbs, Left: 2 lbs  03/21/2022: Lateral pinch: Right: 3.5 lbs, Left: 2 lbs  07/20/22: Grip strength: Right: 3 lbs; Left: 4 lbs and Lateral pinch: Right: 5 lbs, Left: 3 lbs  08/15/2022:  Grip strength: Right: 0 lbs; Left: 5 lbs and Lateral pinch: Right: 2 lbs, Left: 4 lbs  09/26/2022  Grip strength: Right: 0 lbs; Left: 8 lbs and Lateral pinch: Right: 2 lbs, Left: 5 lbs  11/16/2022  Grip strength: Right: 0 lbs; Left: 8 lbs  9/25/20204  Grip strength: Right: 0 lbs; Left: 8 lbs  Grip strength: Right: 0 lbs; Left: 8 lbs   03/08/23:  Grip strength: Right: 0 lbs without PROM, 5 lbs after PROM, Left 8 lbs  05/10/23:  Grip strength: TBD  Bilateral digit PIP/DIP flexion contractures with MP hyperextension with attempts for AROM. Pt. is able to tolerate AROM to the bilateral digits at the initial evaluation however, has a history of pain in the digits.  COORDINATION: Eval: Pt. is unable to  grasp 9-hole test pegs. Pt. is able to initiate grasping larger pegs, and is able to hold a pen in the left hand.  07/20/22: 2 min 36 seconds to remove 9 pegs from 9 hole peg test - cues to locate pegs 2/2 low vision. Pt. is able to initiate grasping larger pegs on R hand and is able to hold a pen in the left hand.  08/15/2022:    Vision, and sensation limiting accuracy of 9 hole peg test results. Pt. Was able to grasp and remove vertical pegs intermittently with cues.    05/10/23: TBD  03/18/24:  Box and Blocks:  Right:  0 blocks completed in 1 min., Left: 7 blocks completed in 1 min.  SENSATION: Light touch: Impaired   EDEMA:  N/A  MUSCLE TONE: BUE flexor Spasticity  COGNITION Overall cognitive status: Continue to assess in functional context  VISION:   Subjective report: Pt. was not wearing glasses at the time of the initial eval.  Baseline vision: Vision is very limited. Wears glasses all the time Visual history: History of impaired vision following CVA. Pt. Has received treatment through the [facility] low vision rehabilitation program.   VISION ASSESSMENT: Impaired To be further assessed in functional context  PERCEPTION: Impaired   PRAXIS: Impaired: motor planning  OBSERVATIONS:  Pt reports being on Tramadol   TODAY'S TREATMENT: 04/08/24  Moist heat modality to the RUE, and bilateral hands in conjunction with ROM   Therapeutic Ex.:    -BUE tolerated PROM perrformed with emphasis placed on slow prolonged gentle stretching for right forearm supination, wrist extension, and bilateral digit MCP/PIP/DIPs of bilateral  hands in conjunction with moist heat modality 2/2 increased flexor tone, tightness, and stiffness.   PATIENT EDUCATION: Education details: ROM, BUE tone  Person educated: Patient and Parent Education method: Explanation Education comprehension: verbalized understanding  HOME EXERCISE PROGRAM:    Incorporating  the right hand during self-feeding tasks.    GOALS: Goals reviewed with patient? Yes  SHORT TERM GOALS: Target date: 04/29/2024     To assess splint fit, and make appropriate adjustments to promote good skin integrity through the palmar surface of the bilateral hands.  Baseline: 05/25/22: Goal currently met, however ongoing as needs to assess splint fit arise. 03/23/2022: Pt. is wearing splints a couple of hours at night bilateral resting hand splints. 03/21/2022: Pt. is wearing splints a couple of hours at night bilateral resting hand splints. Goal status: Deferred   LONG TERM GOALS: Target date: 06/10/2024     FOTO score will Improve by 2 points for Pt. perceived improvement with the assessment specific ADL/IADL tasks.  Baseline: 03/08/23: 51; 12/28/2022: FOTO score: 56; 11/16/2022: 52 09/27/2022: 51 07/20/2022: FOTO 55 05/25/2022: FOTO score: 50,  TR score: 45 Eval: FOTO score: 40, TR score: 45 Goal status: Achieved   2.   Pt. will independently perform oral care for 100% of the task after complete set-up. Baseline: 05/10/23: Pt. Performs 90% of the task. Pt.'s mother assists with 10% of the task for thorough cleaning the hard to reach places way in the back of the oral cavity.03/08/23: not as much assist required proximally, but to adjust positioning of toothbrush; 02/13/23: Pt. Continues to require assist proximally 01/09/2023: Continue 12/28/2022:  Pt. Continues to complete 90% of the task with complete set-up, with visual cues, and occassional assist of the right elbow. 11/16/2022: Pt. Completes 90% of the task with complete set-up. 09/26/2022: Completes 90% of the task, proximal assist at times required at the elbow.  08/15/2022: Completes 90% of the task, proximal assist at times required at the elbow. 07/20/22: completes 90% of task, limited by shoulder flexion. 05/25/2022:  Pt. Is able to initiate and perform oral care for approximately 90% of the task. Complete set-up required. Assi needed only for the very back teeth. 03/23/2022: Pt. Is able  to initiate and perform oral care for approximately 75% of the task. Pt. Requires assist at proximally at the elbow for through oral care. 03/21/2022: Pt. Is able to initiate and perform oral care for approximately 75% of the task. Pt. Requires assist at proximally at the elbow for through oral care. Eval: Pt. is able to initiate using an electric toothbrush. Pt. requires assist for set-up, and assist for thoroughness, and as he Pt. fatigues. Goal status:  Partially met, Goal discontinued  3.  Pt. Will be modified independence with self-feeding for 100% using a swivel spoon, and standard fork after complete set-up Baseline: 05/10/2023: Pt. feeds self 95-100% of the meal. with occasional assist to reposition the utensil. 03/08/23: indep with cup with a handle and lid, assist to reposition eating utensils in hand;  02/13/23:  Pt. Is now able to consistently, and efficiently use a swivel spoon for eating cereal with the left hand.01/09/2023: Continue 12/28/2022: Pt. Is now feeding himself 90% of the time using a swivel spoon with the left hand, and 95% of the time with the fork. Pt. Continues to have difficulty with cutting food. 11/16/2022: 100% with finger foods, Pt. Is initiating with a swivel spoon, and universal cuff for fork use, however requires assist for consistency, and accuracy.  09/26/2022: 90%  using the spoon, assist at time with the forearm motion, and grip on the utensils. 100% for finger foods.  08/15/2022:  90% using the spoon, assit at time with the forearm motion, and grip on the utensils. 100% for finger foods-independent with set-up- unsupervised.  07/20/22: self-feeds cereal using spoon 90% of task. 05/25/2022: Pt. Is able to use a spoon to scoop cereal when feeding himself cereal 85% of the time. Pt. Is able to feed himself snack/finger foods 100% of the time. Pt. Continues to work on consistency of  stabilizing a cup/mug when drinking. Pt. Is able to grasp a water  bottle with assist initially, with  assist tapering off as he drinks.03/23/2022: Pt. Is able to perform scooping cereal for 75% of the time. Pt. required assist, and support at the left elbow, and Pt. Presents with limited forearm supination when using the spoon, and bringing it towards his mouth. Pt. Is able to use a fork to spear items, and perform the hand to mouth pattern.  03/21/2022: Pt. Is able to perform scooping cereal for 75% of the time. Pt. required assist, and support at the left elbow, and Pt. presents with limited forearm supination when using the spoon, and bringing it towards his mouth. Pt. Is able to use a fork to spear items, and perform the hand to mouth pattern.  Eval: Pt. is able to hold standard standard utensils. Pt. Performs as much of the task as he, can and has assistance for the remainder. Goal status:  Improved, Achieved  4.  Pt. will improve grasp patterns and consistently grasp 1/4 objects for ADL, and IADL tasks.  Baseline: 03/18/24: Pt. is able to grasp thin objects including chips, and crackers, and dics. Pt. Continues to work towards grasping progressively smaller objects. 01/08/24: Continue, carry goal over from recertification update last session. 12/27/23: Pt. is able to present with increased tightness  2/2 being way overdue for Botox injections, which limits consistency with grasping small objects. 10/23/2023: Pt. is able to initiate grasping small objects visual cues, and increased time, however continues to work towards grasping 1/4 objects efficiently. 08/27/23: Pt. is able to grasp flat discs, and sustain a secure hold on them while moving the UE through various planes. 08/07/23: Continue 05/31/2023: Continue 05/10/23: Pt. is consistently able to grasp 1/2 objects with visual cues. Pt. Requires assist, and increased visual cues to grasp 1/4 objects on an elevated plane.  Pt. Is unable to grasp the flat objects at the tabletop surface. Pt. Is grasping grasping small puppy vitamins.03/08/23: 1/2 objects  efficiently; 02/13/2023: 1/2 objects efficiently 01/09/2023: Continue 12/28/2022: Continues to grasp 1/4 pegs with min a + visual cues, consistently grasping 1/2 objects with visual cues 11/16/2022: Continues to grasp 1/4 pegs with min a + visual cues, consistently grasping 1/2 objects with visual cues 09/26/2022: Continues to grasp 1/4 pegs with min a + visual cues, consistently grasping 1/2 objects with visual cues. 08/15/2022: grasps 1/4 pegs with min a + visual cues, consistently grasping 1/2 objects with visual cues. 07/20/22: grasps 1/4 pegs with min a + visual cues, consistently grasping 1/2 objects with visual cues. 05/25/2022: Pt. Is working on improving consistency of grasping 1/2 objects with visual cues.  03/23/2022: Pt. Is able to grasp 1 objects consistently,and continues to work on the hand patterns needed to grasp 1/2 objects.03/21/2022: Pt. Is able to grasp 1 objects consistently,and continues to work on the hand patterns needed to grasp 1/2 objects. Eval: Pt. is able to grasp 1 objects intermittently using  a lateral grasp pattern. Goal status: Ongoing  5.  Pt. will independently write his name legibly with letter sizes under 1. Baseline:  05/10/23: Deferred 03/08/23: Not addressed this period; 02/13/2023: Continue 01/09/2023: Continue. 12/28/2022: Pt. is now using an IPad Pen. 11/16/2022: Pt. Continues to be able to write name with smaller letter size. Pt. Is signing name with a computer styus. Pt. Requires visual cues, and assist 09/26/2022: Pt. Continues to be able to write name with smaller letter size. Pt. Is signing name with a computer styus. Pt. Requires visual cues, and assist. 08/15/2022: Pt. Is able to write name with smaller letter size. Pt. Is signing name with a computer styus. Pt. Requires visual cues, and assist. 07/20/22: stabilizing assist to write name with 75% legibility with 2 letters. 05/25/2022: Pt. to continue to work towards formulating grasp patterns in preparation  for grasping a large width pen. Pt. Requires visual cues. 03/23/2022: Pt. Is able to write his name with modA, however has difficulty with formulating letter sizes less than 2 in size with 50% legibility for the 3 letters of his name.03/21/2022: Pt. Is able to write his name with modA, however has difficulty with formulating letter sizes less than 2 in size with 50% legibility for the 3 letters of his name. Eval: Pt is able to hold a thin marker with his left hand, and formulate a line, and initiate a circular pattern (Pt. without glasses today) Goal status: Deferred  6. Pt. Will reach up to comb/brush his hair with minA.  Baseline: 05/10/23: Pt. is able to use a pick independently with the left hand with occasional assist to the far side of the left his head. 03/08/23: Pt uses pick in L hand to reach 75% of his head; unable to reach R side of head; 02/13/2023:Pt. is able to use a hair pick with the left hand on the right side of his head, top, and back of the head. Pt. Continues to have difficulty reaching further to the right side of his head with the left. 01/09/2023: Continue 12/28/2022: Pt. Is progressing, and is now able to use a hair pick with the left hand on the right side of his head, top, and back of the head. Pt. Continues to have difficulty reaching further to the right side of his head with the left. 11/16/2022: Pt. Is able to use a hair pick for the left side of his head using his left hand. Pt. Presents with difficulty reaching to the right side of his head.09/26/2022: Pt. Is able to use a hair pick for the left side of his head using his left hand. Pt. Presents with difficulty reaching to the right side of his head. 5/13/202: 75% using a hair pick, assist to the right side of the head. 07/20/22: reaches 75% of head, assist for far R side of head, fatigues quickly. 05/25/2022: Pt. Is able to reach up with the left hand to the left side, top, and back of his head. 03/23/2022: Pt. is now able to more  consistently initiate reaching up to his head with his left hand in preparation for haircare12/18/2023: Pt. is now able to more consistently initiate reaching up to his head with his left hand in preparation for haircare. Eval: Pt. is able to initiate reaching up for hair care with a long handled brush, however is unable to sustain UE's in elevation to perform the task.     Goal status: Achieved  7. Pt. Will independently navigate the w/c through his  environment with minA with visual scanning, and hand placement on the controls.  Baseline: 11/16/2022: Deferred 2/2 vision 09/26/2022: Pt. Is now navigating his w/c ouside the home, and around his block. 08/15/2022: Pt. Requires minA to setup hand on controls and MIN cues to navigate the w/c in wide spaces, requires MIN - MOD cues to navigate the w/c through more narrow doorways, and tighter turns - varies based on good vs bad vision days.07/20/22: Pt. Requires minA to setup hand on controls and MIN cues to navigate the w/c in wide spaces, requires MIN - MOD cues to navigate the w/c through more narrow doorways, and tighter turns - varies based on good vs bad vision days. 05/25/2022: Pt. Requires minA  and  cues to navigate the w/c in wide spaces, and requires Mod cues to navigate the w/c through more narrow doorways, and tighter turns. Pt. Requires max cues for scanning through the environment, and moderate cues for hand placement on the controls.     Goal status: Deferred 2/2 vision    8. Pt. Will improve bilateral grip strength to be able to independently grasp, and pull up blankets, and linens.   Baseline: 05/10/23: Deferred 03/08/23: R grip 0-5 lbs, L grip 8 lbs; pt reports he can consistently pull up blankets and linens independently.  02/13/2023: Continue. Pt. presents with digit PIP, and DIP flexor tightness. 01/09/2023: Continue 12/28/2023: R: 0#, L: 5# Pt. Is now able to more consistently grasp and pull blankets up over him. 11/16/2022: R: 0 L: 8# Pt. Is more  consistently grasping his blankets and attempting to pull them up. 09/26/2022: R: 0  L: 8# Pt. is attempting to grasp, and pull blankets up more, using mostly the left hand. 08/15/2022: Pt. has difficulty securely holding, and pulling up blankets, and linens.    Goal status:Deferred  9. Pt. will consistently actively control the releasing of blankets, covers, and linens from his hands once they are in the desired position over him. Baseline: 12/28/2022: Pt. Is consistently actively able to release linens once they are in the desired position over him. 11/16/2022: Pt. continues to improve with actively releasing blankets form his hands. 09/26/2022: Pt. is improving with actively releasing blankets form his hands. 08/15/2022: Pt. has difficulty with controlled releasing of blankets/linens from his grasp.     Goal status: Achieved    10. Pt. Will improve bilateral UE strength by 2 MM grades to assist with ADLs, and IADLs  Baseline: 03/18/24: Right: shoulder flexion: 4/5, abduction: 4/5, elbow flexion: 5/5 , extension: 3-/5 wrist extension: 2-/5; Left: shoulder flexion: 4+/5, abduction: 4+/5, elbow flexion: 5/5 , extension: 4+/5  wrist extension: 3+/5   01/08/24: Continue, carry goal over from recertification update last session. 12/27/23:  Right: shoulder flexion: 4/5, abduction: 4/5, elbow flexion: 5/5 , extension: 3-/5 wrist extension: 2-/5; Left: shoulder flexion: 4+/5, abduction: 4+/5, elbow flexion: 5/5 , extension: 4+/5  wrist extension: 3+/5  08/27/23:  Right: shoulder flexion: 3+/5, abduction: 4-/5, elbow flexion: 4+/5 , extension: 4-/5 wrist extension: 2/5; Left: shoulder flexion: 4+/5, abduction: 4+/5, elbow flexion: 5/5 , extension: 4+/5  wrist extension: 3-/5  10/23/2023: Right: shoulder flexion: 4-/5, abduction: 4-/5, elbow flexion: 4+/5 , extension: 4-/5 wrist extension: 2/5; Left: shoulder flexion: 4+/5, abduction: 4+/5, elbow flexion: 5/5 , extension: 4+/5  wrist extension: 3+/5  08/27/23:  Right:  shoulder flexion: 3+/5, abduction: 4-/5, elbow flexion: 4+/5 , extension: 4-/5 wrist extension: 2/5; Left: shoulder flexion: 4+/5, abduction: 4+/5, elbow flexion: 5/5 , extension: 4/5  wrist extension:  3/5  08/07/23: Continue 05/31/2023: Continue 05/10/23: Right: shoulder flexion: 3+/5, abduction: 4-/5, elbow flexion: 4+/5 , extension: 4-/5 wrist extension: 2/5; Left: shoulder flexion: 4+/5, abduction: 4+/5, elbow flexion: 5/5 , extension: 4/5  wrist extension: 3/5 03/13/2023: Right: shoulder flexion: 3/5, abduction: 4/5, elbow flexion: 4+/5 , extension: 4-/5 wrist extension: 2/5; Left: shoulder flexion: 4+/5, abduction: 4+/5, elbow flexion: 5/5 , extension: 4/5  wrist extension: 3/5 03/08/23: NT this date d/t as pt arrived late; 02/13/2023: Continue 01/09/2023: Continue 12/28/2022: Right: shoulder flexion: 3/5, abduction: 4/5, elbow flexion: 4+/5 , extension: 4-/5 wrist extension: 2/5; Left: shoulder flexion: 4+/5, abduction: 4+/5, elbow flexion: 5/5 , extension: 4/5  wrist extension: 3/5 Right: shoulder flexion: 3+/5, abduction: 4/5, elbow flexion: 4+/5 , extension: 4+/5 wrist extension: 2/5; Left: shoulder flexion: 4+/5, abduction: 4+/5, elbow flexion: 5/5 , extension: 4+/5 wrist extension: 3-/5 11/16/2022: Right: shoulder flexion: 3/5, abduction: 4/5, elbow flexion: 4+/5 , extension: 4+/5 wrist extension: 2/5; Left: shoulder flexion: 4+/5, abduction: 4+/5, elbow flexion: 5/5 , extension:  wrist extension: 3-/5 Right: shoulder flexion: 3+/5, abduction: 4/5, elbow flexion: 4+/5 , extension: 4+/5 wrist extension: 2/5; Left: shoulder flexion: 4+/5, abduction: 4+/5, elbow flexion: 5/5 , extension: 4+/5 wrist extension: 3-/5 Goal status: Ongoing  11. Pt. will improve right wrist extension by 10 degrees to initiate reaching for items in preparation for ADLs. Baseline:03/18/24:  Right wrist extension: -36 Right wrist extension: -38(-6) 01/08/24: Continue, carry goal over from recertification update last session.   12/27/23: Right wrist extension: -38(-6) 10/23/23: -30(-8) 08/23/2023: R Wrist Ext: -31(-8)  08/07/23: Continue 05/31/23: Continue 05/10/23: Right wrist extension: -32(22) 03/13/23: -22(35) 03/08/23: NT this date as pt arrived late; 02/13/2023: Continue 01/09/2023: Continue 12/28/2022: -22(34) 11/16/2022: -30(10)    Goal status: Ongoing      12. Pt. Will require donn/doff a Tank top T-shirt efficiently with supervision and full set-up.    Baseline: 03/18/24: Independent doffing, Pt. Reports assist is required for pulling the T-shirt Tank down in the back.01/08/24: Continue, carry goal over from recertification update last session. 12/27/23: Independent doffing, MaxA doffing 10/23/23: Independent doff the shirt/tank, MaxA  donning. 10/23/23: Pt. Is able to independently doff his shirt, requiring assist to lean forward and loosen the back. 08/07/23: Continue 05/31/2023: Pt. Requires maxA donning a pullover sweatshirt, Mod-maxA doffing it. T-shirt tank TBD 05/10/23: Pt. Presents with difficulty completing, and requires increased time to complete    Goal status: Ongoing    13. Pt. Will independently perform bilateral/bimanual hand tasks needed to fold towels/linens/laundry Baseline: 03/18/24: Continue goal 01/08/24: Continue, carry goal over from recertification update last session. 12/27/23: Pt. Has not been able to work on the this  2/2 multiple cancellations this recertification 2/2 illness, familiy health issues,and transportation. 10/23/23: Pt. Continues to have difficulty folding laundry, and linens. 08/23/23: Pt. Continues to have difficulty using bilateral/bimanual hand tasks needed to fold towels/linens/laundry. 08/07/23: Continue 05/31/23: Continue 05/10/23:  Pt. Has difficulty folding towels/lines/laundry using bilateral hands Goal status:Ongoing  14. Pt. Will independently, and efficiently reach his right arm over the side of the chair to pet his dog.    Baseline: 08/23/2023: Pt. Is now able to reach over the side of  the armrest of his recliner chair to pet his dog.  08/07/23: Continue 05/31/2023: Continue 05/10/23: Pt. is unable to efficiently reach his right UE over the side of his recliner chair to pet his dog.    Goal status: Achieved    15. Pt. Will independently, and efficiently reach out in front of him to efficiently  place items onto his table while sitting in his recliner.     Baseline: 03/28/24:  Pt. is able to reach out to efficiently reach out in front of him to place items on the table with the left hand, continue to work towards efficiency, as well as using the RUE. 01/08/24: Continue, carry goal over from recertification update last session. 12/27/2023: Pt. is able to reach out to efficiently reach out in front of him to place items on the table 90% of the time with the LUE, 10% of the time with the RUE 2/2  increased flexor tone. 10/23/23: Pt. Is improving with functional reaching with the left UE out in front of him to place items onto the table. Pt. has difficulty reaching out with the right hand to  place items on the table. 08/23/2023: Pt. has difficulty consistently reaching out in front of him to place items on the on a tabletop surface with the right hand. 08/07/23: Continue 2/26/225: Continue 05/10/23: Pt. has difficulty reaching out in front of him far enough to efficiently place items on the table while sitting in a recliner chair.    Goal status: Continue/Ongoing     16. Pt. Will improve bilateral hand supination by 10 degrees to be able actively turn items in his hand to look at them independently and efficiently during ADLs/IADLs,    Baseline: 03/28/24:  Right: Supination is limited by increased tone, and tightness; Left: 26(38) 01/08/24: Continue, carry goal over from recertification update last session. 12/27/23: Right: Unable 2/2 increased tightness  Left: supination 20(38) 10/23/23: Right: unable, L: supination 30(38)    Goal status: Continue/Ongoing      17. Pt. Will independently, and  efficiently transition between picking up finger foods for dipping, and  picking up utensils , then placing them down on the tabletop surface through 75% of the meal with minimal cues.    Baseline: 03/18/24: Continue, carry goal over from recertification update last session. 12/27/23: Pt. is able to consistently transition between picking up finger foods for dipping, and utensils consistently with the left hand with 100% accuracy, and continues to work on  accuracy with the right hand 2/2 limitations from flexor tone, and tightness prior to Botox.  01/08/24: Continue, carry goal over from recertification update last session. 12/27/23: Pt. is able to consistently transition between picking up finger foods for dipping, and utensils consistently with the left hand with 100% accuracy time, and the right hand with 5% accuracy. 10/23/23: Pt. requires heavy cues, and assist to grasp finger foods, and utensils, and transition between the two.    Goal status: Continue/Ongoing      18. Pt. Will independently transfer small ADL items from one hand to the other efficiently.    Baseline: 03/18/24:  Pt. Requires increased time to transfer items between his hands. Pt. Is able to transfer 1.5 discs between his hands with 60% accuracy    Goal status: New      19. Pt will independently perform controlled hand to face patterns using bilateral hands symmetrically, and efficiently in preparation for being able to intiate donning, and doffing glasses safely.    Baseline: 03/18/24: Pt. Has difficulty using bilateral hand symmetrically, and efficiently for controlled hand to face patterns of movement.    Goal status: New    ASSESSMENT:  CLINICAL IMPRESSION:  Pt. was late arriving for the appointment this afternoon. Pt. reports that it is not a good day for his vision which Pt./caregiver attribute to high blood sugar  from the holidays. Pt. continues to tolerate the bilateral hand exercises well  however has increased  tightness with right forearm supination, wrist extension, and digit extension. Pt. presents with increased ROM 2/2 decreased tightness. Pt./caregiver reports the cold weather contributes to tightness. Pt. responds well the moist heat in in conjunction with ROM. Pt.'s caregivers continue to be very supportive to continue working towards improving BUE functioning, ROM, strength, motor control,  and [facility] skills in order to maximize independence with daily ADLs, and IADL functioning. Plan to continue with the same goals into the next recertification period.   PERFORMANCE DEFICITS in functional skills including ADLs, IADLs, coordination, dexterity, proprioception, ROM, strength, pain, FMC, GMC, decreased knowledge of use of DME, and UE functional use, cognitive skills including safety awareness, and psychosocial skills including coping strategies, environmental adaptation, habits, and routines and behaviors.   IMPAIRMENTS are limiting patient from ADLs, IADLs, leisure, and social participation.   COMORBIDITIES may have co-morbidities  that affects occupational performance. Patient will benefit from skilled OT to address above impairments and improve overall function.  MODIFICATION OR ASSISTANCE TO COMPLETE EVALUATION: Maximum or significant modification of tasks or assist is necessary to complete an evaluation.  OT OCCUPATIONAL PROFILE AND HISTORY: Comprehensive assessment: Review of records and extensive additional review of physical, cognitive, psychosocial history related to current functional performance.  CLINICAL DECISION MAKING: High - multiple treatment options, significant modification of task necessary  REHAB POTENTIAL: Fair    EVALUATION COMPLEXITY: High    PLAN: OT FREQUENCY: 2 x's a week  OT DURATION:12 weeks  PLANNED INTERVENTIONS: self care/ADL training, therapeutic exercise, therapeutic activity, neuromuscular re-education, manual therapy, passive range of motion, patient/family  education, and cognitive remediation/compensation RECOMMENDED OTHER SERVICES: PT  CONSULTED AND AGREED WITH PLAN OF CARE: Patient and family member/caregiver  PLAN FOR NEXT SESSION: Review HEP   Richardson Otter, MS, OTR/L  04/08/24

## 2024-04-10 ENCOUNTER — Ambulatory Visit: Admitting: Occupational Therapy

## 2024-04-10 ENCOUNTER — Ambulatory Visit

## 2024-04-15 ENCOUNTER — Ambulatory Visit

## 2024-04-15 ENCOUNTER — Ambulatory Visit: Admitting: Occupational Therapy

## 2024-04-17 ENCOUNTER — Ambulatory Visit: Admitting: Occupational Therapy

## 2024-04-17 ENCOUNTER — Ambulatory Visit

## 2024-04-22 ENCOUNTER — Ambulatory Visit: Admitting: Occupational Therapy

## 2024-04-22 ENCOUNTER — Ambulatory Visit

## 2024-04-24 ENCOUNTER — Ambulatory Visit

## 2024-04-24 ENCOUNTER — Ambulatory Visit: Admitting: Occupational Therapy

## 2024-04-29 ENCOUNTER — Ambulatory Visit

## 2024-04-29 ENCOUNTER — Ambulatory Visit: Admitting: Occupational Therapy

## 2024-05-01 ENCOUNTER — Ambulatory Visit: Admitting: Occupational Therapy

## 2024-05-01 ENCOUNTER — Ambulatory Visit

## 2024-05-06 ENCOUNTER — Ambulatory Visit

## 2024-05-06 ENCOUNTER — Ambulatory Visit: Admitting: Occupational Therapy

## 2024-05-08 ENCOUNTER — Ambulatory Visit

## 2024-05-08 ENCOUNTER — Ambulatory Visit: Admitting: Occupational Therapy

## 2024-05-13 ENCOUNTER — Ambulatory Visit

## 2024-05-13 ENCOUNTER — Ambulatory Visit: Attending: Physician Assistant | Admitting: Occupational Therapy

## 2024-05-15 ENCOUNTER — Ambulatory Visit: Admitting: Occupational Therapy

## 2024-05-15 ENCOUNTER — Ambulatory Visit

## 2024-05-20 ENCOUNTER — Ambulatory Visit

## 2024-05-20 ENCOUNTER — Ambulatory Visit: Admitting: Occupational Therapy

## 2024-05-22 ENCOUNTER — Ambulatory Visit

## 2024-05-22 ENCOUNTER — Ambulatory Visit: Admitting: Occupational Therapy

## 2024-05-27 ENCOUNTER — Ambulatory Visit

## 2024-05-27 ENCOUNTER — Ambulatory Visit: Admitting: Occupational Therapy

## 2024-05-29 ENCOUNTER — Ambulatory Visit

## 2024-05-29 ENCOUNTER — Ambulatory Visit: Admitting: Occupational Therapy

## 2024-06-03 ENCOUNTER — Ambulatory Visit: Attending: Physician Assistant | Admitting: Occupational Therapy

## 2024-06-03 ENCOUNTER — Ambulatory Visit

## 2024-06-05 ENCOUNTER — Ambulatory Visit

## 2024-06-05 ENCOUNTER — Ambulatory Visit: Admitting: Occupational Therapy

## 2024-06-10 ENCOUNTER — Ambulatory Visit

## 2024-06-10 ENCOUNTER — Ambulatory Visit: Admitting: Occupational Therapy

## 2024-06-12 ENCOUNTER — Ambulatory Visit

## 2024-06-12 ENCOUNTER — Ambulatory Visit: Admitting: Occupational Therapy

## 2024-06-17 ENCOUNTER — Ambulatory Visit

## 2024-06-17 ENCOUNTER — Encounter: Admitting: Occupational Therapy

## 2024-06-19 ENCOUNTER — Ambulatory Visit

## 2024-06-19 ENCOUNTER — Encounter: Admitting: Occupational Therapy

## 2024-06-24 ENCOUNTER — Encounter: Admitting: Occupational Therapy

## 2024-06-24 ENCOUNTER — Ambulatory Visit

## 2024-06-26 ENCOUNTER — Ambulatory Visit

## 2024-06-26 ENCOUNTER — Encounter: Admitting: Occupational Therapy

## 2024-07-01 ENCOUNTER — Encounter: Admitting: Occupational Therapy

## 2024-07-01 ENCOUNTER — Ambulatory Visit

## 2024-07-03 ENCOUNTER — Encounter: Admitting: Occupational Therapy

## 2024-07-03 ENCOUNTER — Ambulatory Visit

## 2024-07-08 ENCOUNTER — Ambulatory Visit

## 2024-07-08 ENCOUNTER — Encounter: Admitting: Occupational Therapy

## 2024-07-10 ENCOUNTER — Encounter: Admitting: Occupational Therapy

## 2024-07-10 ENCOUNTER — Ambulatory Visit

## 2024-07-15 ENCOUNTER — Ambulatory Visit

## 2024-07-15 ENCOUNTER — Encounter: Admitting: Occupational Therapy

## 2024-07-17 ENCOUNTER — Ambulatory Visit

## 2024-07-17 ENCOUNTER — Encounter: Admitting: Occupational Therapy

## 2024-07-22 ENCOUNTER — Encounter: Admitting: Occupational Therapy

## 2024-07-22 ENCOUNTER — Ambulatory Visit

## 2024-07-24 ENCOUNTER — Encounter: Admitting: Occupational Therapy

## 2024-07-24 ENCOUNTER — Ambulatory Visit

## 2024-07-29 ENCOUNTER — Ambulatory Visit

## 2024-07-29 ENCOUNTER — Encounter: Admitting: Occupational Therapy

## 2024-07-31 ENCOUNTER — Encounter: Admitting: Occupational Therapy

## 2024-07-31 ENCOUNTER — Ambulatory Visit

## 2024-08-05 ENCOUNTER — Encounter: Admitting: Occupational Therapy

## 2024-08-05 ENCOUNTER — Ambulatory Visit

## 2024-08-07 ENCOUNTER — Ambulatory Visit

## 2024-08-07 ENCOUNTER — Encounter: Admitting: Occupational Therapy

## 2024-08-12 ENCOUNTER — Ambulatory Visit

## 2024-08-12 ENCOUNTER — Encounter: Admitting: Occupational Therapy
